# Patient Record
Sex: Female | Born: 1945
Health system: Southern US, Community
[De-identification: ages and names within clinical notes are randomized; demographics above are authoritative.]

## PROBLEM LIST (undated history)

## (undated) DIAGNOSIS — K625 Hemorrhage of anus and rectum: Secondary | ICD-10-CM

## (undated) DIAGNOSIS — I4891 Unspecified atrial fibrillation: Secondary | ICD-10-CM

## (undated) DIAGNOSIS — Z9289 Personal history of other medical treatment: Secondary | ICD-10-CM

## (undated) DIAGNOSIS — J189 Pneumonia, unspecified organism: Secondary | ICD-10-CM

## (undated) DIAGNOSIS — G8918 Other acute postprocedural pain: Secondary | ICD-10-CM

## (undated) DIAGNOSIS — N186 End stage renal disease: Secondary | ICD-10-CM

## (undated) DIAGNOSIS — E860 Dehydration: Secondary | ICD-10-CM

## (undated) DIAGNOSIS — T8859XA Other complications of anesthesia, initial encounter: Secondary | ICD-10-CM

## (undated) DIAGNOSIS — K219 Gastro-esophageal reflux disease without esophagitis: Secondary | ICD-10-CM

## (undated) DIAGNOSIS — R0602 Shortness of breath: Secondary | ICD-10-CM

## (undated) DIAGNOSIS — Z0389 Encounter for observation for other suspected diseases and conditions ruled out: Secondary | ICD-10-CM

## (undated) DIAGNOSIS — I10 Essential (primary) hypertension: Secondary | ICD-10-CM

## (undated) DIAGNOSIS — N390 Urinary tract infection, site not specified: Secondary | ICD-10-CM

## (undated) DIAGNOSIS — I5032 Chronic diastolic (congestive) heart failure: Secondary | ICD-10-CM

## (undated) DIAGNOSIS — T4145XA Adverse effect of unspecified anesthetic, initial encounter: Secondary | ICD-10-CM

## (undated) DIAGNOSIS — A0811 Acute gastroenteropathy due to Norwalk agent: Secondary | ICD-10-CM

## (undated) DIAGNOSIS — Z8719 Personal history of other diseases of the digestive system: Secondary | ICD-10-CM

## (undated) DIAGNOSIS — I480 Paroxysmal atrial fibrillation: Secondary | ICD-10-CM

## (undated) DIAGNOSIS — N200 Calculus of kidney: Secondary | ICD-10-CM

## (undated) DIAGNOSIS — Z992 Dependence on renal dialysis: Secondary | ICD-10-CM

## (undated) DIAGNOSIS — E785 Hyperlipidemia, unspecified: Secondary | ICD-10-CM

## (undated) DIAGNOSIS — R0902 Hypoxemia: Secondary | ICD-10-CM

## (undated) DIAGNOSIS — K921 Melena: Secondary | ICD-10-CM

## (undated) DIAGNOSIS — Z87442 Personal history of urinary calculi: Secondary | ICD-10-CM

## (undated) DIAGNOSIS — R51 Headache: Secondary | ICD-10-CM

## (undated) DIAGNOSIS — IMO0002 Reserved for concepts with insufficient information to code with codable children: Secondary | ICD-10-CM

## (undated) DIAGNOSIS — E611 Iron deficiency: Secondary | ICD-10-CM

## (undated) DIAGNOSIS — M1712 Unilateral primary osteoarthritis, left knee: Secondary | ICD-10-CM

## (undated) DIAGNOSIS — H409 Unspecified glaucoma: Secondary | ICD-10-CM

## (undated) DIAGNOSIS — I429 Cardiomyopathy, unspecified: Secondary | ICD-10-CM

## (undated) DIAGNOSIS — K5730 Diverticulosis of large intestine without perforation or abscess without bleeding: Secondary | ICD-10-CM

## (undated) HISTORY — DX: Personal history of other medical treatment: Z92.89

## (undated) HISTORY — PX: CARDIOVASCULAR STRESS TEST: SHX262

## (undated) HISTORY — PX: CATARACT EXTRACTION W/ INTRAOCULAR LENS  IMPLANT, BILATERAL: SHX1307

## (undated) HISTORY — PX: EXTRACORPOREAL SHOCK WAVE LITHOTRIPSY: SHX1557

## (undated) HISTORY — DX: Iron deficiency: E61.1

## (undated) HISTORY — DX: Personal history of other diseases of the digestive system: Z87.19

## (undated) HISTORY — PX: CARDIAC CATHETERIZATION: SHX172

## (undated) HISTORY — DX: Cardiomyopathy, unspecified: I42.9

## (undated) SURGERY — COLONOSCOPY WITH PROPOFOL
Anesthesia: Monitor Anesthesia Care | Laterality: Bilateral

---

## 1991-06-21 HISTORY — PX: TOTAL ABDOMINAL HYSTERECTOMY W/ BILATERAL SALPINGOOPHORECTOMY: SHX83

## 1998-05-13 ENCOUNTER — Other Ambulatory Visit: Admission: RE | Admit: 1998-05-13 | Discharge: 1998-05-13 | Payer: Self-pay | Admitting: *Deleted

## 1999-05-25 ENCOUNTER — Other Ambulatory Visit: Admission: RE | Admit: 1999-05-25 | Discharge: 1999-05-25 | Payer: Self-pay | Admitting: Obstetrics and Gynecology

## 1999-08-05 ENCOUNTER — Ambulatory Visit (HOSPITAL_COMMUNITY): Admission: RE | Admit: 1999-08-05 | Discharge: 1999-08-05 | Payer: Self-pay | Admitting: Gastroenterology

## 2001-02-06 ENCOUNTER — Encounter: Payer: Self-pay | Admitting: Family Medicine

## 2001-02-06 ENCOUNTER — Encounter: Admission: RE | Admit: 2001-02-06 | Discharge: 2001-02-06 | Payer: Self-pay | Admitting: Family Medicine

## 2001-06-06 ENCOUNTER — Other Ambulatory Visit: Admission: RE | Admit: 2001-06-06 | Discharge: 2001-06-06 | Payer: Self-pay | Admitting: Obstetrics and Gynecology

## 2002-09-22 ENCOUNTER — Encounter: Payer: Self-pay | Admitting: Emergency Medicine

## 2002-09-22 ENCOUNTER — Emergency Department (HOSPITAL_COMMUNITY): Admission: AD | Admit: 2002-09-22 | Discharge: 2002-09-22 | Payer: Self-pay | Admitting: Emergency Medicine

## 2003-02-14 ENCOUNTER — Ambulatory Visit (HOSPITAL_BASED_OUTPATIENT_CLINIC_OR_DEPARTMENT_OTHER): Admission: RE | Admit: 2003-02-14 | Discharge: 2003-02-14 | Payer: Self-pay | Admitting: Orthopedic Surgery

## 2003-02-14 HISTORY — PX: KNEE ARTHROSCOPY: SUR90

## 2005-01-25 ENCOUNTER — Encounter: Admission: RE | Admit: 2005-01-25 | Discharge: 2005-01-25 | Payer: Self-pay | Admitting: Family Medicine

## 2005-02-02 ENCOUNTER — Encounter: Admission: RE | Admit: 2005-02-02 | Discharge: 2005-02-02 | Payer: Self-pay | Admitting: Family Medicine

## 2005-02-25 ENCOUNTER — Emergency Department (HOSPITAL_COMMUNITY): Admission: EM | Admit: 2005-02-25 | Discharge: 2005-02-25 | Payer: Self-pay | Admitting: Emergency Medicine

## 2005-03-23 ENCOUNTER — Encounter (INDEPENDENT_AMBULATORY_CARE_PROVIDER_SITE_OTHER): Payer: Self-pay | Admitting: *Deleted

## 2005-03-23 ENCOUNTER — Ambulatory Visit (HOSPITAL_COMMUNITY): Admission: RE | Admit: 2005-03-23 | Discharge: 2005-03-23 | Payer: Self-pay | Admitting: Surgery

## 2005-03-23 HISTORY — PX: LAPAROSCOPIC CHOLECYSTECTOMY: SUR755

## 2007-07-31 ENCOUNTER — Other Ambulatory Visit: Admission: RE | Admit: 2007-07-31 | Discharge: 2007-07-31 | Payer: Self-pay | Admitting: Obstetrics & Gynecology

## 2007-07-31 LAB — HM PAP SMEAR: HM Pap smear: NORMAL

## 2007-08-23 HISTORY — PX: BREAST BIOPSY: SHX20

## 2010-01-18 LAB — HM COLONOSCOPY: HM Colonoscopy: NEGATIVE

## 2010-11-05 NOTE — Op Note (Signed)
[name], [name]               ACCOUNT NO.:  [id number]   MEDICAL RECORD NO.:  [id number]          PATIENT TYPE:  AMB   LOCATION:  DAY                          FACILITY:  [facility]   PHYSICIAN:  Thomas A. Cornett, M.D.DATE OF BIRTH:  [birth year]   DATE OF PROCEDURE:  03/23/2005  DATE OF DISCHARGE:                                 OPERATIVE REPORT   PREOPERATIVE DIAGNOSIS:  Symptomatic cholelithiasis.   POSTOPERATIVE DIAGNOSIS:  Symptomatic cholelithiasis.   PROCEDURE:  Laparoscopic cholecystectomy with intraoperative cholangiogram.   SURGEON:  Thomas A. Cornett, M.D.   ASSISTANT:  Georgina Quint, M.D.   ANESTHESIA:  General endotracheal anesthesia.   ESTIMATED BLOOD LOSS:  60 mL.   DRAINS:  None.   INDICATIONS FOR PROCEDURE:  The patient is a 65 year old female who has  biliary colic. She had gallstones by ultrasound and I recommended a  laparoscopic cholecystectomy for symptomatic cholelithiasis.   DESCRIPTION OF PROCEDURE:  The patient was brought to the operating suite  and placed supine. After induction of general endotracheal anesthesia, her  abdomen prepped and draped in a sterile fashion. An oral gastric tube was  placed and she received preoperative antibiotics. A 1 cm supraumbilical  incision was made, dissection was carried down to her fascia. A small  incision was made in her fascia and both sides were grasped in between  [place] clamps. A Kelly clamp was then used to open the peritoneum. An 11 mm  Hasson cannula was placed under direct vision and pneumoperitoneum was  created to 15 mmHg with CO2. Laparoscopy was then performed. There is of  note a lower abdominal hernia just above the pubis with no evidence of bowel  and some omentum adhesed to it. This appeared reducible. In the upper  abdomen, the gallbladder was visualized. Three 5 mm ports were placed, one  in the subxiphoid position under direct vision with local anesthesia, one in  the right upper quadrant  and a third in the right mid abdomen. The  gallbladder dome was grasped and retracted to the patient's right shoulder.  The infundibulum was grasped and retracted to the patient's right lower  quadrant. I used the cautery to score the peritoneum over the junction of  the cystic duct and gallbladder. I dissected out the cystic duct  circumferentially. This is the only tubular structure entering the  gallbladder infundibulum. A clip was placed on the  gallbladder side of this  and a small incision was made in the cystic duct to perform intraoperative  cholangiogram. Through a separate stab incision, a Cook Cholangiogram  catheter was placed under direct vision. The catheter was placed in the  cystic duct, this was clipped, irrigated with no signs of leakage.  Intraoperative Cholangiogram with fluoroscopy was then performed with 1/2  strength Hypaque dye. The cystic duct appeared to insert on the right  hepatic duct. It may have run posteriorly to insert in the common duct but  appeared to me to insert on the right hepatic duct. The left hepatic duct  could be visualized as well. The common duct could be visualized with free  flow of bile into the duodenum without signs of obstruction or stone. At  this point in time, the Cholangiogram was complete. I double clipped the  cystic duct stump and continued the division of the cystic duct. The cystic  artery had some small branches that I controlled individually with small  clips entering the gallbladder. Once these vessels were under control,  cautery was used to dissect the gallbladder from the gallbladder fossa. This  was done to the dome. There was a large branch of a vein that entered the  dome which had some bleeding once divided with the cautery. This was  controlled with electrocautery and Surgicel with adequate results. The  gallbladder was then extracted through the umbilical port with the help of a  5 mm screw. At this point in time, the  10 mm port was removed and I closed  this with a #0 Vicryl suture. I inspected the bed and there was still some  oozing from the gallbladder bed and again this was controlled with cautery  as well some Surgicel with satisfactory response. The clips around the  cystic duct and the cystic artery with no signs of bleeding or leakage of  bile at this point. The Surgicel was kept in place. I had to reinsert a 10  mm port though to help aid with control of the oozing from the hepatic bed.  Once the oozing had stopped, all irrigation was suctioned out. I then  removed the ports under direct vision with no signs of port site bleeding. I  removed the 11 mm Hasson umbilical port and passed it off the field. I  closed the umbilical port with a second suture of figure 8 using #0 Vicryl.  A 4-0 Monocryl was used to close all skin incisions. All sponge, needle and  counts were found be correct at this portion of the case. The patient was  then awoke, taken to recovery in satisfactory condition.      Thomas A. Cornett, M.D.  Electronically Signed     TAC/MEDQ  D:  03/23/2005  T:  03/23/2005  Job:  [id number]   cc:   Luna Kitchens. Redmond Pulling, M.D.  Fax: [facility]

## 2010-11-05 NOTE — Op Note (Signed)
[name], [name] NO.:  [id number]   MEDICAL RECORD NO.:  [id number]                   PATIENT TYPE:  AMB   LOCATION:                                       FACILITY:  [facility]   PHYSICIAN:  Kipp Brood. Gioffre, M.D.             DATE OF BIRTH:  05-04-1946   DATE OF PROCEDURE:  02/14/2003  DATE OF DISCHARGE:                                 OPERATIVE REPORT   PREOPERATIVE DIAGNOSIS:  Rule out the possibility of a torn medial meniscus  left knee versus degenerative arthritis left knee.   POSTOPERATIVE DIAGNOSES:  1. Early degenerative arthritis left knee.  2. Severe chronic synovitis left knee.   PROCEDURE:   SURGEON:  Ronald A. Gioffre, M.D.   ASSISTANT:  Nurse   Under general anesthesia, routine orthopedic prep and drape of the left knee  was carried out.  At this time a small punctate incision was made in the  suprapatellar pouch and inflow cannula was inserted and the knee was  distended with saline.  Following this, another small punctate incision was  made in the anterolateral joint space.  The arthroscope was entered and a  complete diagnostic arthroscopy was carried out.  At this time, I noted in  the suprapatellar pouch there was severe chronic synovitis. I introduced a  shaver suction device and did a synovectomy.   I then went down the lateral joint space.  It was clean.  There were no  meniscal tears. The cruciates were intact.  The medial joint space, and the  medial meniscus was intact, but she had a large flap tear of the articular  cartilage of the medial femoral condyle.  This presented as though it was a  piece of orange peel pealing off an orange and I simply introduced a shaver  suction device and did an abrasion chondroplasty.  I thoroughly irrigated  out the knee, took a second look back into the knee with the scope and there  were no other abnormalities.   I thoroughly irrigated out the knee, closed all 3 punctate  incisions with 3-  0 nylon suture.  I then injected 30 cc of 1/2% Marcaine with epinephrine  into the knee joint.  Sterile bundle dressing was applied.  The patient will  be placed on Percocet 10/650 for pain postoperative as well as Keflex 500 mg  q.i.d. She will also be on Bufferin 1 twice a day for 2 weeks as an  anticoagulant.  She will be on a crutches, partial weight bearing.  I will  see her in 12-14 days in the office or prior to that if there are any  problems.  Ronald A. Gladstone Lighter, M.D.    RAG/MEDQ  D:  02/14/2003  T:  02/15/2003  Job:  [id number]

## 2011-06-21 DIAGNOSIS — I219 Acute myocardial infarction, unspecified: Secondary | ICD-10-CM

## 2011-06-21 HISTORY — DX: Acute myocardial infarction, unspecified: I21.9

## 2011-07-06 LAB — HM DEXA SCAN

## 2011-07-29 LAB — HM MAMMOGRAPHY: HM Mammogram: NORMAL

## 2012-04-03 ENCOUNTER — Emergency Department (HOSPITAL_COMMUNITY): Payer: Medicare Other

## 2012-04-03 ENCOUNTER — Encounter (HOSPITAL_COMMUNITY): Payer: Self-pay | Admitting: *Deleted

## 2012-04-03 ENCOUNTER — Inpatient Hospital Stay (HOSPITAL_COMMUNITY)
Admission: EM | Admit: 2012-04-03 | Discharge: 2012-04-17 | DRG: 870 | Disposition: A | Discharge status: Rehabilitation Facility | Payer: Medicare Other | Attending: Internal Medicine | Admitting: Internal Medicine

## 2012-04-03 DIAGNOSIS — N12 Tubulo-interstitial nephritis, not specified as acute or chronic: Secondary | ICD-10-CM

## 2012-04-03 DIAGNOSIS — N200 Calculus of kidney: Secondary | ICD-10-CM

## 2012-04-03 DIAGNOSIS — G9341 Metabolic encephalopathy: Secondary | ICD-10-CM | POA: Diagnosis present

## 2012-04-03 DIAGNOSIS — Z6839 Body mass index (BMI) 39.0-39.9, adult: Secondary | ICD-10-CM

## 2012-04-03 DIAGNOSIS — K7200 Acute and subacute hepatic failure without coma: Secondary | ICD-10-CM | POA: Diagnosis present

## 2012-04-03 DIAGNOSIS — Z23 Encounter for immunization: Secondary | ICD-10-CM

## 2012-04-03 DIAGNOSIS — I214 Non-ST elevation (NSTEMI) myocardial infarction: Secondary | ICD-10-CM | POA: Diagnosis not present

## 2012-04-03 DIAGNOSIS — A419 Sepsis, unspecified organism: Secondary | ICD-10-CM

## 2012-04-03 DIAGNOSIS — R6520 Severe sepsis without septic shock: Secondary | ICD-10-CM | POA: Diagnosis present

## 2012-04-03 DIAGNOSIS — E870 Hyperosmolality and hypernatremia: Secondary | ICD-10-CM | POA: Diagnosis not present

## 2012-04-03 DIAGNOSIS — R5381 Other malaise: Secondary | ICD-10-CM | POA: Diagnosis not present

## 2012-04-03 DIAGNOSIS — G9340 Encephalopathy, unspecified: Secondary | ICD-10-CM | POA: Diagnosis present

## 2012-04-03 DIAGNOSIS — Y9210 Unspecified residential institution as the place of occurrence of the external cause: Secondary | ICD-10-CM | POA: Diagnosis not present

## 2012-04-03 DIAGNOSIS — N179 Acute kidney failure, unspecified: Secondary | ICD-10-CM

## 2012-04-03 DIAGNOSIS — I4891 Unspecified atrial fibrillation: Secondary | ICD-10-CM | POA: Diagnosis not present

## 2012-04-03 DIAGNOSIS — L270 Generalized skin eruption due to drugs and medicaments taken internally: Secondary | ICD-10-CM | POA: Diagnosis not present

## 2012-04-03 DIAGNOSIS — R197 Diarrhea, unspecified: Secondary | ICD-10-CM | POA: Diagnosis not present

## 2012-04-03 DIAGNOSIS — I5021 Acute systolic (congestive) heart failure: Secondary | ICD-10-CM | POA: Diagnosis not present

## 2012-04-03 DIAGNOSIS — J9819 Other pulmonary collapse: Secondary | ICD-10-CM | POA: Diagnosis present

## 2012-04-03 DIAGNOSIS — E876 Hypokalemia: Secondary | ICD-10-CM | POA: Diagnosis not present

## 2012-04-03 DIAGNOSIS — Z79899 Other long term (current) drug therapy: Secondary | ICD-10-CM

## 2012-04-03 DIAGNOSIS — D696 Thrombocytopenia, unspecified: Secondary | ICD-10-CM | POA: Diagnosis not present

## 2012-04-03 DIAGNOSIS — R0902 Hypoxemia: Secondary | ICD-10-CM

## 2012-04-03 DIAGNOSIS — A4151 Sepsis due to Escherichia coli [E. coli]: Principal | ICD-10-CM | POA: Diagnosis present

## 2012-04-03 DIAGNOSIS — J9600 Acute respiratory failure, unspecified whether with hypoxia or hypercapnia: Secondary | ICD-10-CM | POA: Diagnosis not present

## 2012-04-03 DIAGNOSIS — I10 Essential (primary) hypertension: Secondary | ICD-10-CM | POA: Diagnosis present

## 2012-04-03 DIAGNOSIS — T50995A Adverse effect of other drugs, medicaments and biological substances, initial encounter: Secondary | ICD-10-CM | POA: Diagnosis not present

## 2012-04-03 DIAGNOSIS — R6521 Severe sepsis with septic shock: Secondary | ICD-10-CM | POA: Diagnosis present

## 2012-04-03 DIAGNOSIS — I509 Heart failure, unspecified: Secondary | ICD-10-CM | POA: Diagnosis present

## 2012-04-03 DIAGNOSIS — E872 Acidosis, unspecified: Secondary | ICD-10-CM | POA: Diagnosis not present

## 2012-04-03 DIAGNOSIS — J189 Pneumonia, unspecified organism: Secondary | ICD-10-CM | POA: Diagnosis present

## 2012-04-03 DIAGNOSIS — N201 Calculus of ureter: Secondary | ICD-10-CM | POA: Diagnosis present

## 2012-04-03 DIAGNOSIS — E46 Unspecified protein-calorie malnutrition: Secondary | ICD-10-CM | POA: Diagnosis not present

## 2012-04-03 DIAGNOSIS — Z7982 Long term (current) use of aspirin: Secondary | ICD-10-CM

## 2012-04-03 DIAGNOSIS — N170 Acute kidney failure with tubular necrosis: Secondary | ICD-10-CM | POA: Diagnosis present

## 2012-04-03 DIAGNOSIS — N390 Urinary tract infection, site not specified: Secondary | ICD-10-CM

## 2012-04-03 DIAGNOSIS — K219 Gastro-esophageal reflux disease without esophagitis: Secondary | ICD-10-CM | POA: Diagnosis present

## 2012-04-03 DIAGNOSIS — E162 Hypoglycemia, unspecified: Secondary | ICD-10-CM | POA: Diagnosis not present

## 2012-04-03 DIAGNOSIS — B372 Candidiasis of skin and nail: Secondary | ICD-10-CM | POA: Diagnosis not present

## 2012-04-03 DIAGNOSIS — I428 Other cardiomyopathies: Secondary | ICD-10-CM | POA: Diagnosis present

## 2012-04-03 HISTORY — DX: Unspecified glaucoma: H40.9

## 2012-04-03 HISTORY — DX: Essential (primary) hypertension: I10

## 2012-04-03 HISTORY — DX: Gastro-esophageal reflux disease without esophagitis: K21.9

## 2012-04-03 HISTORY — DX: Headache: R51

## 2012-04-03 HISTORY — DX: Pneumonia, unspecified organism: J18.9

## 2012-04-03 LAB — CBC WITH DIFFERENTIAL/PLATELET
Basophils Absolute: 0 10*3/uL (ref 0.0–0.1)
Basophils Relative: 0 % (ref 0–1)
Eosinophils Absolute: 0.1 10*3/uL (ref 0.0–0.7)
Eosinophils Relative: 1 % (ref 0–5)
HCT: 36.3 % (ref 36.0–46.0)
Hemoglobin: 12.2 g/dL (ref 12.0–15.0)
Lymphocytes Relative: 3 % — ABNORMAL LOW (ref 12–46)
Lymphs Abs: 0.2 10*3/uL — ABNORMAL LOW (ref 0.7–4.0)
MCH: 29.3 pg (ref 26.0–34.0)
MCHC: 33.6 g/dL (ref 30.0–36.0)
MCV: 87.1 fL (ref 78.0–100.0)
Monocytes Absolute: 0.1 10*3/uL (ref 0.1–1.0)
Monocytes Relative: 1 % — ABNORMAL LOW (ref 3–12)
Neutro Abs: 4.9 10*3/uL (ref 1.7–7.7)
Neutrophils Relative %: 95 % — ABNORMAL HIGH (ref 43–77)
Platelets: 131 10*3/uL — ABNORMAL LOW (ref 150–400)
RBC: 4.17 MIL/uL (ref 3.87–5.11)
RDW: 14.8 % (ref 11.5–15.5)
WBC Morphology: INCREASED
WBC: 5.3 10*3/uL (ref 4.0–10.5)

## 2012-04-03 LAB — POCT I-STAT 3, ART BLOOD GAS (G3+)
Acid-base deficit: 7 mmol/L — ABNORMAL HIGH (ref 0.0–2.0)
Bicarbonate: 19.3 mEq/L — ABNORMAL LOW (ref 20.0–24.0)
O2 Saturation: 29 %
Patient temperature: 98.6
TCO2: 21 mmol/L (ref 0–100)
pCO2 arterial: 41 mmHg (ref 35.0–45.0)
pH, Arterial: 7.281 — ABNORMAL LOW (ref 7.350–7.450)
pO2, Arterial: 21 mmHg — CL (ref 80.0–100.0)

## 2012-04-03 LAB — URINE MICROSCOPIC-ADD ON

## 2012-04-03 LAB — URINALYSIS, ROUTINE W REFLEX MICROSCOPIC
Glucose, UA: NEGATIVE mg/dL
Ketones, ur: 15 mg/dL — AB
Nitrite: POSITIVE — AB
Protein, ur: 30 mg/dL — AB
Specific Gravity, Urine: 1.024 (ref 1.005–1.030)
Urobilinogen, UA: 1 mg/dL (ref 0.0–1.0)
pH: 5 (ref 5.0–8.0)

## 2012-04-03 LAB — LACTIC ACID, PLASMA: Lactic Acid, Venous: 5 mmol/L — ABNORMAL HIGH (ref 0.5–2.2)

## 2012-04-03 LAB — POCT I-STAT TROPONIN I: Troponin i, poc: 0.06 ng/mL (ref 0.00–0.08)

## 2012-04-03 LAB — GLUCOSE, CAPILLARY: Glucose-Capillary: 97 mg/dL (ref 70–99)

## 2012-04-03 MED ORDER — ACETAMINOPHEN 650 MG RE SUPP
650.0000 mg | Freq: Once | RECTAL | Status: AC
  Administered 2012-04-03: 650 mg via RECTAL
  Filled 2012-04-03: qty 1

## 2012-04-03 MED ORDER — SODIUM CHLORIDE 0.9 % IV BOLUS (SEPSIS)
1000.0000 mL | Freq: Once | INTRAVENOUS | Status: AC
  Administered 2012-04-03: 1000 mL via INTRAVENOUS

## 2012-04-03 MED ORDER — DEXTROSE 5 % IV SOLN
INTRAVENOUS | Status: AC
  Filled 2012-04-03: qty 250

## 2012-04-03 MED ORDER — NOREPINEPHRINE BITARTRATE 1 MG/ML IJ SOLN
INTRAMUSCULAR | Status: AC
  Filled 2012-04-03: qty 4

## 2012-04-03 MED ORDER — PIPERACILLIN-TAZOBACTAM 3.375 G IVPB
3.3750 g | Freq: Once | INTRAVENOUS | Status: AC
  Administered 2012-04-03: 3.375 g via INTRAVENOUS
  Filled 2012-04-03: qty 50

## 2012-04-03 MED ORDER — NOREPINEPHRINE BITARTRATE 1 MG/ML IJ SOLN
2.0000 ug/min | Freq: Once | INTRAVENOUS | Status: AC
  Administered 2012-04-04: 5 ug/min via INTRAVENOUS
  Filled 2012-04-03: qty 4

## 2012-04-03 MED ORDER — VANCOMYCIN HCL 1000 MG IV SOLR
1500.0000 mg | Freq: Once | INTRAVENOUS | Status: AC
  Administered 2012-04-03: 1500 mg via INTRAVENOUS
  Filled 2012-04-03 (×2): qty 1500

## 2012-04-03 NOTE — ED Notes (Signed)
RES and EDP made aware of pts blood pressure and vitals signs. RES at bedside to speak with pt regarding central line placement.

## 2012-04-03 NOTE — ED Notes (Signed)
Pt in via EMS- Pt in c/o shortness of breath since this am, pt woke daughter up around 4 am stating she thought she had a kidney stone, denies pain at this time, shortness of breath developed this evening, original O2 at 83%, pt on NRB at this time at 100%, pt warm to touch for EMS, IV established PTA.

## 2012-04-03 NOTE — ED Notes (Signed)
Lab at bedside obtaining blood cultures, once obtained will start antibiotics.

## 2012-04-03 NOTE — ED Provider Notes (Signed)
History     CSN: [id number]  Arrival date & time 04/03/12  2156   First MD Initiated Contact with Patient 04/03/12 2156      Chief Complaint  Patient presents with  . Shortness of Breath     HPI   she 66-year-old female with past medical history of hypertension presents via EMS with shortness of breath him a left flank pain, and altered mental status. On arrival to the scene EMS reports an O2 saturation of 82% the patient was placed on nonrebreather mask. Per daughter's report the patient complained of left flank pain promptly 1 AM this morning. This pain lasted approximately 6-7 hours. Patient subsequently developed confusion, Chills and subjective fever. Her flank pain was described as sharp.  On arrival the patient is febrile to 104.1, tachycardic into the 130s tachypneic (respiratory rate 35-40), satting 95% on 2 L nasal cannula. Patient denies any pain on arrival. She is oriented to place person and time.  Past Medical History  Diagnosis Date  . Hypertension     History reviewed. No pertinent past surgical history.  History reviewed. No pertinent family history.  History  Substance Use Topics  . Smoking status: Not on file  . Smokeless tobacco: Not on file  . Alcohol Use:     OB History    Grav Para Term Preterm Abortions TAB SAB Ect Mult Living                  Review of Systems  Constitutional: Positive for fever and chills. Negative for activity change and appetite change.  HENT: Negative for ear pain, congestion, rhinorrhea and neck pain.   Eyes: Negative for pain.  Respiratory: Negative for cough and shortness of breath.   Cardiovascular: Negative for chest pain and palpitations.  Gastrointestinal: Positive for abdominal pain ( Left flank now resolved). Negative for nausea and vomiting.  Genitourinary: Negative for dysuria, difficulty urinating and pelvic pain.  Musculoskeletal: Negative for back pain.  Skin: Negative for rash and wound.  Neurological:  Negative for weakness and headaches.  Psychiatric/Behavioral: Positive for confusion. Negative for behavioral problems and agitation.    Allergies  Review of patient's allergies indicates no known allergies.  Home Medications  No current outpatient prescriptions on file.  There were no vitals taken for this visit.  Physical Exam  Constitutional: She is oriented to person, place, and time. She appears well-developed and well-nourished. No distress.       Morbidly obese  HENT:  Head: Normocephalic and atraumatic.  Nose: Nose normal.  Mouth/Throat: Oropharynx is clear and moist.  Eyes: EOM are normal. Pupils are equal, round, and reactive to light.  Neck: Normal range of motion. Neck supple. No tracheal deviation present.  Cardiovascular: Regular rhythm, normal heart sounds and intact distal pulses.        Tachycardic  Pulmonary/Chest: She has rales ( Bibasilar).       Tachypneic  Abdominal: Soft. Bowel sounds are normal. She exhibits no distension. There is no tenderness. There is no rebound and no guarding.       Left CVA tenderness  Musculoskeletal: Normal range of motion. She exhibits no tenderness.  Neurological: She is oriented to person, place, and time.  Skin: Skin is warm and dry. No rash noted.    ED Course  CENTRAL LINE Date/Time: 04/04/2012 12:02 AM Performed by: Ruthell Rummage Authorized by: Ruthell Rummage Consent: Verbal consent obtained. Written consent obtained. Consent given by: Daughter. Patient identity confirmed: verbally with patient and arm  band Time out: Immediately prior to procedure a "time out" was called to verify the correct patient, procedure, equipment, support staff and site/side marked as required. Indications: vascular access and central pressure monitoring Anesthesia: local infiltration Local anesthetic: lidocaine 1% with epinephrine Anesthetic total: 3 ml Patient sedated: no Preparation: skin prepped with 2% chlorhexidine Skin prep agent  dried: skin prep agent completely dried prior to procedure Sterile barriers: all five maximum sterile barriers used - cap, mask, sterile gown, sterile gloves, and large sterile sheet Hand hygiene: hand hygiene performed prior to central venous catheter insertion Location details: right internal jugular Patient position: flat Catheter type: triple lumen Pre-procedure: landmarks identified Ultrasound guidance: yes Number of attempts: 1 Successful placement: yes Post-procedure: line sutured and dressing applied Assessment: blood return through all parts, no pneumothorax on x-ray and placement verified by x-ray Patient tolerance: Patient tolerated the procedure well with no immediate complications.    Results for orders placed during the hospital encounter of 04/03/12  CBC WITH DIFFERENTIAL      Component Value Range   WBC 5.3  4.0 - 10.5 K/uL   RBC 4.17  3.87 - 5.11 MIL/uL   Hemoglobin 12.2  12.0 - 15.0 g/dL   HCT 36.3  36.0 - 46.0 %   MCV 87.1  78.0 - 100.0 fL   MCH 29.3  26.0 - 34.0 pg   MCHC 33.6  30.0 - 36.0 g/dL   RDW 14.8  11.5 - 15.5 %   Platelets 131 (*) 150 - 400 K/uL   Neutrophils Relative 95 (*) 43 - 77 %   Lymphocytes Relative 3 (*) 12 - 46 %   Monocytes Relative 1 (*) 3 - 12 %   Eosinophils Relative 1  0 - 5 %   Basophils Relative 0  0 - 1 %   Neutro Abs 4.9  1.7 - 7.7 K/uL   Lymphs Abs 0.2 (*) 0.7 - 4.0 K/uL   Monocytes Absolute 0.1  0.1 - 1.0 K/uL   Eosinophils Absolute 0.1  0.0 - 0.7 K/uL   Basophils Absolute 0.0  0.0 - 0.1 K/uL   WBC Morphology INCREASED BANDS (>20% BANDS)    LACTIC ACID, PLASMA      Component Value Range   Lactic Acid, Venous 5.0 (*) 0.5 - 2.2 mmol/L  URINALYSIS, ROUTINE W REFLEX MICROSCOPIC      Component Value Range   Color, Urine ORANGE (*) YELLOW   APPearance TURBID (*) CLEAR   Specific Gravity, Urine 1.024  1.005 - 1.030   pH 5.0  5.0 - 8.0   Glucose, UA NEGATIVE  NEGATIVE mg/dL   Hgb urine dipstick LARGE (*) NEGATIVE   Bilirubin  Urine SMALL (*) NEGATIVE   Ketones, ur 15 (*) NEGATIVE mg/dL   Protein, ur 30 (*) NEGATIVE mg/dL   Urobilinogen, UA 1.0  0.0 - 1.0 mg/dL   Nitrite POSITIVE (*) NEGATIVE   Leukocytes, UA LARGE (*) NEGATIVE  GLUCOSE, CAPILLARY      Component Value Range   Glucose-Capillary 97  70 - 99 mg/dL  POCT I-STAT TROPONIN I      Component Value Range   Troponin i, poc 0.06  0.00 - 0.08 ng/mL   Comment 3           URINE MICROSCOPIC-ADD ON      Component Value Range   Squamous Epithelial / LPF FEW (*) RARE   WBC, UA TOO NUMEROUS TO COUNT  <3 WBC/hpf   RBC / HPF 21-50  <3 RBC/hpf  Bacteria, UA MANY (*) RARE  POCT I-STAT 3, BLOOD GAS (G3+)      Component Value Range   pH, Arterial 7.281 (*) 7.350 - 7.450   pCO2 arterial 41.0  35.0 - 45.0 mmHg   pO2, Arterial 21.0 (*) 80.0 - 100.0 mmHg   Bicarbonate 19.3 (*) 20.0 - 24.0 mEq/L   TCO2 21  0 - 100 mmol/L   O2 Saturation 29.0     Acid-base deficit 7.0 (*) 0.0 - 2.0 mmol/L   Patient temperature 98.6 F     Collection site RADIAL, ALLEN'S TEST ACCEPTABLE     Drawn by Operator     Sample type ARTERIAL     Comment NOTIFIED PHYSICIAN      DG Chest Portable 1 View (Final result)   Result time:04/03/12 2238    Final result by Rad Results In Interface (04/03/12 22:38:39)    Narrative:   *RADIOLOGY REPORT*  Clinical Data: Shortness of breath.  PORTABLE CHEST - 1 VIEW  Comparison: 03/23/2005  Findings: Numerous leads and wires project over the chest. Patient rotated left. Mild cardiomegaly. Cannot exclude small left pleural effusion; soft tissues project over the left costophrenic angle on the frontal. No pneumothorax. Low lung volumes. Possible concurrent mild pulmonary venous congestion. Mild volume loss at the left lung base. Moderate to large hiatal hernia.  IMPRESSION:  1. Cardiomegaly and low lung volumes. Cannot exclude mild pulmonary venous congestion. 2. Moderate-to-large hiatal hernia. 3. Degraded AP portable view secondary  overlying soft tissues at the left lung base. Consider PA and lateral radiographs, if possible.   Original Report Authenticated By: Areta Haber, M.D.      1. Urosepsis       MDM    66 year old female presents to EMS. Code sepsis activated on arrival. Patient febrile, tachycardic, and hypotensive. Blood cultures drawn prior to and about in menstruation patient received vancomycin and Zosyn. Copious hydration with normal saline boluses. The patient received 4 L while in the emergency department. UA with positive nitrites leukocytes and many bacteria. Lactic acid 5.0. No evidence of consolidation on chest x-ray. Doubt pneumonia but recommend obtaining a two-view chest x-ray when patient more stable. ABG with a pH of 7.28 PCO2 of 41.0. Patient sat in the mid 90s on 2 L nasal cannula. No clinical need for intubation at this time. Central line placed as described above. Patient was started on Levophed and a drip. Case discussed with the critical care staff. Plan to admit patient to the intensive care unit. Recommend CT of abdomen and pelvis to rule out intra-abdominal pathology given history of severe left flank pain when patient is more stable. Patient is now pain-free.   CRITICAL CARE Performed by: Ruthell Rummage   Total critical care time:  35 min  Critical care time was exclusive of separately billable procedures and treating other patients.  Critical care was necessary to treat or prevent imminent or life-threatening deterioration.  Critical care was time spent personally by me on the following activities: development of treatment plan with patient and/or surrogate as well as nursing, discussions with consultants, evaluation of patient's response to treatment, examination of patient, obtaining history from patient or surrogate, ordering and performing treatments and interventions, ordering and review of laboratory studies, ordering and review of radiographic studies, pulse oximetry  and re-evaluation of patient's condition.      Ruthell Rummage, MD 04/04/12 0157  Ruthell Rummage, MD 04/04/12 [phone]

## 2012-04-03 NOTE — ED Notes (Signed)
MD Judithann Graves at bedside preparing for central line placement.

## 2012-04-03 NOTE — ED Provider Notes (Addendum)
I saw and evaluated the patient, reviewed the resident's note and I agree with the findings and plan.  Patient with shortness of breath and cough with hypotension and hypoxia. Suspect the patient likely has pneumonia. Awaiting labs and x-rays. Due to her hypotension to sepsis called. Blood cultures and antibiotics ordered and she will be admitted  Leota Jacobsen, MD 04/03/12 2218\   11:40 PM Patient reassessed multiple times during her stay in the department. Patient given IV fluids and treatment for her fever. Patient started on empiric antibiotics for sepsis. Patient is urine positive for infection patient is likely uroseptic. Spoke with intensivist and will come to admit the patient. Central line was placed by the resident which was done under my direct supervision. Patient to be started on Levophed for persistent hypotension despite given adequate fluids.  CRITICAL CARE Performed by: Leota Jacobsen   Total critical care time: 70  Critical care time was exclusive of separately billable procedures and treating other patients.  Critical care was necessary to treat or prevent imminent or life-threatening deterioration.  Critical care was time spent personally by me on the following activities: development of treatment plan with patient and/or surrogate as well as nursing, discussions with consultants, evaluation of patient's response to treatment, examination of patient, obtaining history from patient or surrogate, ordering and performing treatments and interventions, ordering and review of laboratory studies, ordering and review of radiographic studies, pulse oximetry and re-evaluation of patient's condition.   Leota Jacobsen, MD 04/03/12 [phone]

## 2012-04-03 NOTE — ED Notes (Signed)
Central line placed by MD Arrieta, portable chest xray to be obtained.

## 2012-04-04 ENCOUNTER — Inpatient Hospital Stay (HOSPITAL_COMMUNITY): Payer: Medicare Other

## 2012-04-04 ENCOUNTER — Encounter (HOSPITAL_COMMUNITY): Payer: Self-pay | Admitting: Anesthesiology

## 2012-04-04 ENCOUNTER — Encounter (HOSPITAL_COMMUNITY)
Admission: EM | Disposition: A | Discharge status: Rehabilitation Facility | Payer: Self-pay | Source: Home / Self Care | Attending: Pulmonary Disease

## 2012-04-04 ENCOUNTER — Encounter (HOSPITAL_COMMUNITY): Payer: Self-pay | Admitting: *Deleted

## 2012-04-04 ENCOUNTER — Inpatient Hospital Stay (HOSPITAL_COMMUNITY): Payer: Medicare Other | Admitting: Anesthesiology

## 2012-04-04 DIAGNOSIS — N200 Calculus of kidney: Secondary | ICD-10-CM | POA: Diagnosis present

## 2012-04-04 DIAGNOSIS — N12 Tubulo-interstitial nephritis, not specified as acute or chronic: Secondary | ICD-10-CM | POA: Diagnosis present

## 2012-04-04 DIAGNOSIS — R6521 Severe sepsis with septic shock: Secondary | ICD-10-CM | POA: Diagnosis present

## 2012-04-04 DIAGNOSIS — R0902 Hypoxemia: Secondary | ICD-10-CM | POA: Diagnosis present

## 2012-04-04 DIAGNOSIS — G9340 Encephalopathy, unspecified: Secondary | ICD-10-CM | POA: Diagnosis present

## 2012-04-04 DIAGNOSIS — I369 Nonrheumatic tricuspid valve disorder, unspecified: Secondary | ICD-10-CM

## 2012-04-04 DIAGNOSIS — J9600 Acute respiratory failure, unspecified whether with hypoxia or hypercapnia: Secondary | ICD-10-CM

## 2012-04-04 DIAGNOSIS — N390 Urinary tract infection, site not specified: Secondary | ICD-10-CM

## 2012-04-04 DIAGNOSIS — A419 Sepsis, unspecified organism: Secondary | ICD-10-CM | POA: Diagnosis present

## 2012-04-04 DIAGNOSIS — N179 Acute kidney failure, unspecified: Secondary | ICD-10-CM

## 2012-04-04 HISTORY — PX: CYSTOSCOPY W/ URETERAL STENT PLACEMENT: SHX1429

## 2012-04-04 LAB — POCT I-STAT 3, ART BLOOD GAS (G3+)
Acid-base deficit: 13 mmol/L — ABNORMAL HIGH (ref 0.0–2.0)
Acid-base deficit: 11 mmol/L — ABNORMAL HIGH (ref 0.0–2.0)
Bicarbonate: 14.2 mEq/L — ABNORMAL LOW (ref 20.0–24.0)
Bicarbonate: 14.3 mEq/L — ABNORMAL LOW (ref 20.0–24.0)
O2 Saturation: 94 %
O2 Saturation: 99 %
Patient temperature: 98.5
Patient temperature: 98.6
TCO2: 15 mmol/L (ref 0–100)
TCO2: 15 mmol/L (ref 0–100)
pCO2 arterial: 34.8 mmHg — ABNORMAL LOW (ref 35.0–45.0)
pCO2 arterial: 30.5 mmHg — ABNORMAL LOW (ref 35.0–45.0)
pH, Arterial: 7.217 — ABNORMAL LOW (ref 7.350–7.450)
pH, Arterial: 7.278 — ABNORMAL LOW (ref 7.350–7.450)
pO2, Arterial: 86 mmHg (ref 80.0–100.0)
pO2, Arterial: 150 mmHg — ABNORMAL HIGH (ref 80.0–100.0)

## 2012-04-04 LAB — TROPONIN I
Troponin I: <0.3 ng/mL (ref <0.30)
Troponin I: 0.44 ng/mL (ref <0.30)
Troponin I: 1.12 ng/mL (ref <0.30)
Troponin I: 1.49 ng/mL (ref <0.30)

## 2012-04-04 LAB — CORTISOL
Cortisol, Plasma: 44.2 ug/dL
Cortisol, Plasma: 33.8 ug/dL
Cortisol, Plasma: 34.7 ug/dL

## 2012-04-04 LAB — POCT I-STAT 7, (LYTES, BLD GAS, ICA,H+H)
Acid-base deficit: 14 mmol/L — ABNORMAL HIGH (ref 0.0–2.0)
Bicarbonate: 15.8 mEq/L — ABNORMAL LOW (ref 20.0–24.0)
Calcium, Ion: 1.07 mmol/L — ABNORMAL LOW (ref 1.13–1.30)
HCT: 39 % (ref 36.0–46.0)
Hemoglobin: 13.3 g/dL (ref 12.0–15.0)
O2 Saturation: 92 %
Patient temperature: 37.5
Potassium: 4.1 mEq/L (ref 3.5–5.1)
Sodium: 136 mEq/L (ref 135–145)
TCO2: 17 mmol/L (ref 0–100)
pCO2 arterial: 50.9 mmHg — ABNORMAL HIGH (ref 35.0–45.0)
pH, Arterial: 7.104 — CL (ref 7.350–7.450)
pO2, Arterial: 89 mmHg (ref 80.0–100.0)

## 2012-04-04 LAB — HEPATIC FUNCTION PANEL
ALT: 80 U/L — ABNORMAL HIGH (ref 0–35)
AST: 127 U/L — ABNORMAL HIGH (ref 0–37)
Albumin: 2.4 g/dL — ABNORMAL LOW (ref 3.5–5.2)
Alkaline Phosphatase: 209 U/L — ABNORMAL HIGH (ref 39–117)
Bilirubin, Direct: 0.8 mg/dL — ABNORMAL HIGH (ref 0.0–0.3)
Indirect Bilirubin: 0.1 mg/dL — ABNORMAL LOW (ref 0.3–0.9)
Total Bilirubin: 0.9 mg/dL (ref 0.3–1.2)
Total Protein: 5.2 g/dL — ABNORMAL LOW (ref 6.0–8.3)

## 2012-04-04 LAB — COMPREHENSIVE METABOLIC PANEL
ALT: 71 U/L — ABNORMAL HIGH (ref 0–35)
AST: 104 U/L — ABNORMAL HIGH (ref 0–37)
Albumin: 2.5 g/dL — ABNORMAL LOW (ref 3.5–5.2)
Alkaline Phosphatase: 169 U/L — ABNORMAL HIGH (ref 39–117)
BUN: 33 mg/dL — ABNORMAL HIGH (ref 6–23)
CO2: 19 mEq/L (ref 19–32)
Calcium: 8.1 mg/dL — ABNORMAL LOW (ref 8.4–10.5)
Chloride: 100 mEq/L (ref 96–112)
Creatinine, Ser: 1.9 mg/dL — ABNORMAL HIGH (ref 0.50–1.10)
GFR calc Af Amer: 30 mL/min — ABNORMAL LOW (ref >90)
GFR calc non Af Amer: 26 mL/min — ABNORMAL LOW (ref >90)
Glucose, Bld: 93 mg/dL (ref 70–99)
Potassium: 3.5 mEq/L (ref 3.5–5.1)
Sodium: 133 mEq/L — ABNORMAL LOW (ref 135–145)
Total Bilirubin: 0.9 mg/dL (ref 0.3–1.2)
Total Protein: 5.1 g/dL — ABNORMAL LOW (ref 6.0–8.3)

## 2012-04-04 LAB — MAGNESIUM
Magnesium: 1 mg/dL — ABNORMAL LOW (ref 1.5–2.5)
Magnesium: 1.2 mg/dL — ABNORMAL LOW (ref 1.5–2.5)

## 2012-04-04 LAB — BLOOD GAS, ARTERIAL
Acid-base deficit: 9.3 mmol/L — ABNORMAL HIGH (ref 0.0–2.0)
Bicarbonate: 15.5 mEq/L — ABNORMAL LOW (ref 20.0–24.0)
Drawn by: 36496
FIO2: 0.44 %
O2 Saturation: 93.2 %
Patient temperature: 98.6
TCO2: 16.4 mmol/L (ref 0–100)
pCO2 arterial: 30.2 mmHg — ABNORMAL LOW (ref 35.0–45.0)
pH, Arterial: 7.329 — ABNORMAL LOW (ref 7.350–7.450)
pO2, Arterial: 69.1 mmHg — ABNORMAL LOW (ref 80.0–100.0)

## 2012-04-04 LAB — CBC
HCT: 37.5 % (ref 36.0–46.0)
Hemoglobin: 12.3 g/dL (ref 12.0–15.0)
MCH: 28.9 pg (ref 26.0–34.0)
MCHC: 32.8 g/dL (ref 30.0–36.0)
MCV: 88.2 fL (ref 78.0–100.0)
Platelets: 152 10*3/uL (ref 150–400)
RBC: 4.25 MIL/uL (ref 3.87–5.11)
RDW: 15.2 % (ref 11.5–15.5)
WBC: 5.1 10*3/uL (ref 4.0–10.5)

## 2012-04-04 LAB — TYPE AND SCREEN
ABO/RH(D): O POS
Antibody Screen: NEGATIVE

## 2012-04-04 LAB — BASIC METABOLIC PANEL
BUN: 34 mg/dL — ABNORMAL HIGH (ref 6–23)
BUN: 37 mg/dL — ABNORMAL HIGH (ref 6–23)
CO2: 15 mEq/L — ABNORMAL LOW (ref 19–32)
CO2: 15 mEq/L — ABNORMAL LOW (ref 19–32)
Calcium: 7.2 mg/dL — ABNORMAL LOW (ref 8.4–10.5)
Calcium: 6.9 mg/dL — ABNORMAL LOW (ref 8.4–10.5)
Chloride: 103 mEq/L (ref 96–112)
Chloride: 104 mEq/L (ref 96–112)
Creatinine, Ser: 2 mg/dL — ABNORMAL HIGH (ref 0.50–1.10)
Creatinine, Ser: 2.3 mg/dL — ABNORMAL HIGH (ref 0.50–1.10)
GFR calc Af Amer: 29 mL/min — ABNORMAL LOW (ref >90)
GFR calc Af Amer: 24 mL/min — ABNORMAL LOW (ref >90)
GFR calc non Af Amer: 25 mL/min — ABNORMAL LOW (ref >90)
GFR calc non Af Amer: 21 mL/min — ABNORMAL LOW (ref >90)
Glucose, Bld: 188 mg/dL — ABNORMAL HIGH (ref 70–99)
Glucose, Bld: 153 mg/dL — ABNORMAL HIGH (ref 70–99)
Potassium: 3.9 mEq/L (ref 3.5–5.1)
Potassium: 3.7 mEq/L (ref 3.5–5.1)
Sodium: 132 mEq/L — ABNORMAL LOW (ref 135–145)
Sodium: 134 mEq/L — ABNORMAL LOW (ref 135–145)

## 2012-04-04 LAB — PHOSPHORUS
Phosphorus: 2.2 mg/dL — ABNORMAL LOW (ref 2.3–4.6)
Phosphorus: 4.2 mg/dL (ref 2.3–4.6)

## 2012-04-04 LAB — CARBOXYHEMOGLOBIN
Carboxyhemoglobin: 1 % (ref 0.5–1.5)
Carboxyhemoglobin: 0.6 % (ref 0.5–1.5)
Methemoglobin: 1.5 % (ref 0.0–1.5)
Methemoglobin: 1.1 % (ref 0.0–1.5)
O2 Saturation: 70.3 %
O2 Saturation: 73.2 %
Total hemoglobin: 12.3 g/dL (ref 12.0–16.0)
Total hemoglobin: 14.1 g/dL (ref 12.0–16.0)

## 2012-04-04 LAB — GLUCOSE, CAPILLARY
Glucose-Capillary: 107 mg/dL — ABNORMAL HIGH (ref 70–99)
Glucose-Capillary: 125 mg/dL — ABNORMAL HIGH (ref 70–99)
Glucose-Capillary: 115 mg/dL — ABNORMAL HIGH (ref 70–99)
Glucose-Capillary: 115 mg/dL — ABNORMAL HIGH (ref 70–99)
Glucose-Capillary: 163 mg/dL — ABNORMAL HIGH (ref 70–99)
Glucose-Capillary: 193 mg/dL — ABNORMAL HIGH (ref 70–99)

## 2012-04-04 LAB — ABO/RH: ABO/RH(D): O POS

## 2012-04-04 LAB — PROCALCITONIN: Procalcitonin: 82.91 ng/mL

## 2012-04-04 LAB — PRO B NATRIURETIC PEPTIDE: Pro B Natriuretic peptide (BNP): 8289 pg/mL — ABNORMAL HIGH (ref 0–125)

## 2012-04-04 LAB — LACTIC ACID, PLASMA
Lactic Acid, Venous: 3 mmol/L — ABNORMAL HIGH (ref 0.5–2.2)
Lactic Acid, Venous: 3.8 mmol/L — ABNORMAL HIGH (ref 0.5–2.2)

## 2012-04-04 LAB — FIBRINOGEN: Fibrinogen: 389 mg/dL (ref 204–475)

## 2012-04-04 LAB — MRSA PCR SCREENING: MRSA by PCR: NEGATIVE

## 2012-04-04 LAB — AMYLASE: Amylase: 19 U/L (ref 0–105)

## 2012-04-04 LAB — LIPASE, BLOOD: Lipase: 9 U/L — ABNORMAL LOW (ref 11–59)

## 2012-04-04 SURGERY — CYSTOSCOPY, WITH RETROGRADE PYELOGRAM AND URETERAL STENT INSERTION
Anesthesia: General | Site: Ureter | Laterality: Left | Wound class: Clean Contaminated

## 2012-04-04 MED ORDER — PHENYLEPHRINE HCL 10 MG/ML IJ SOLN: 30.0000 ug/min | INTRAVENOUS | Status: DC | PRN

## 2012-04-04 MED ORDER — ALBUTEROL SULFATE HFA 108 (90 BASE) MCG/ACT IN AERS
6.0000 | INHALATION_SPRAY | RESPIRATORY_TRACT | Status: DC | PRN
  Filled 2012-04-04: qty 6.7

## 2012-04-04 MED ORDER — SODIUM CHLORIDE 0.9 % IV SOLN
50.0000 ug/h | INTRAVENOUS | Status: DC
  Administered 2012-04-04: 50 ug/h via INTRAVENOUS
  Filled 2012-04-04 (×2): qty 50

## 2012-04-04 MED ORDER — ROCURONIUM BROMIDE 100 MG/10ML IV SOLN
INTRAVENOUS | Status: DC | PRN
  Administered 2012-04-04: 50 mg via INTRAVENOUS

## 2012-04-04 MED ORDER — FAMOTIDINE IN NACL 20-0.9 MG/50ML-% IV SOLN
20.0000 mg | Freq: Two times a day (BID) | INTRAVENOUS | Status: DC
  Administered 2012-04-04 (×2): 20 mg via INTRAVENOUS
  Filled 2012-04-04 (×4): qty 50

## 2012-04-04 MED ORDER — SODIUM BICARBONATE 8.4 % IV SOLN
INTRAVENOUS | Status: DC
  Administered 2012-04-04 – 2012-04-05 (×2): via INTRAVENOUS
  Filled 2012-04-04 (×3): qty 100

## 2012-04-04 MED ORDER — VANCOMYCIN HCL IN DEXTROSE 1-5 GM/200ML-% IV SOLN
1000.0000 mg | INTRAVENOUS | Status: DC
  Administered 2012-04-04: 1000 mg via INTRAVENOUS
  Filled 2012-04-04: qty 200

## 2012-04-04 MED ORDER — SODIUM CHLORIDE 0.9 % IV BOLUS (SEPSIS)
500.0000 mL | Freq: Once | INTRAVENOUS | Status: AC
  Administered 2012-04-04: 500 mL via INTRAVENOUS

## 2012-04-04 MED ORDER — FENTANYL CITRATE 0.05 MG/ML IJ SOLN
INTRAMUSCULAR | Status: DC | PRN
  Administered 2012-04-04: 25 ug via INTRAVENOUS

## 2012-04-04 MED ORDER — FENTANYL BOLUS VIA INFUSION
50.0000 ug | Freq: Four times a day (QID) | INTRAVENOUS | Status: DC | PRN
  Filled 2012-04-04: qty 100

## 2012-04-04 MED ORDER — ASPIRIN 300 MG RE SUPP: 300.0000 mg | RECTAL | Status: AC

## 2012-04-04 MED ORDER — SODIUM CHLORIDE 0.9 % IV BOLUS (SEPSIS): 25.0000 mL/kg | Freq: Once | INTRAVENOUS | Status: DC

## 2012-04-04 MED ORDER — DEXTROSE 5 % IV SOLN: 500.0000 mg | INTRAVENOUS | Status: DC

## 2012-04-04 MED ORDER — CISATRACURIUM BOLUS VIA INFUSION
5.0000 mg | Freq: Once | INTRAVENOUS | Status: DC
  Filled 2012-04-04: qty 5

## 2012-04-04 MED ORDER — DEXTROSE 5 % IV SOLN: 1.0000 g | INTRAVENOUS | Status: DC

## 2012-04-04 MED ORDER — DEXTROSE 5 % IV SOLN
1.0000 g | Freq: Once | INTRAVENOUS | Status: AC
  Administered 2012-04-04: 1 g via INTRAVENOUS
  Filled 2012-04-04: qty 10

## 2012-04-04 MED ORDER — NOREPINEPHRINE BITARTRATE 1 MG/ML IJ SOLN
2.0000 ug/min | INTRAVENOUS | Status: DC | PRN
  Administered 2012-04-04: 15 ug/min via INTRAVENOUS
  Administered 2012-04-04: 30 ug/min via INTRAVENOUS
  Filled 2012-04-04 (×2): qty 4

## 2012-04-04 MED ORDER — PIPERACILLIN-TAZOBACTAM 3.375 G IVPB 30 MIN: 3.3750 g | Freq: Once | INTRAVENOUS | Status: DC

## 2012-04-04 MED ORDER — LEVOFLOXACIN IN D5W 750 MG/150ML IV SOLN
750.0000 mg | INTRAVENOUS | Status: DC
  Administered 2012-04-04: 750 mg via INTRAVENOUS
  Filled 2012-04-04 (×2): qty 150

## 2012-04-04 MED ORDER — CISATRACURIUM BESYLATE 10 MG/ML IV SOLN
3.0000 ug/kg/min | INTRAVENOUS | Status: DC
  Filled 2012-04-04: qty 20

## 2012-04-04 MED ORDER — MIDAZOLAM HCL 2 MG/2ML IJ SOLN: 1.0000 mg | Freq: Four times a day (QID) | INTRAMUSCULAR | Status: DC

## 2012-04-04 MED ORDER — SODIUM CHLORIDE 0.9 % IV SOLN: 250.0000 mL | INTRAVENOUS | Status: DC | PRN

## 2012-04-04 MED ORDER — MIDAZOLAM HCL 2 MG/2ML IJ SOLN
1.0000 mg | INTRAMUSCULAR | Status: DC | PRN
  Administered 2012-04-04 (×2): 2 mg via INTRAVENOUS
  Filled 2012-04-04 (×2): qty 2

## 2012-04-04 MED ORDER — FENTANYL CITRATE 0.05 MG/ML IJ SOLN
25.0000 ug | INTRAMUSCULAR | Status: DC | PRN
  Administered 2012-04-07 – 2012-04-14 (×24): 50 ug via INTRAVENOUS
  Filled 2012-04-04 (×23): qty 2

## 2012-04-04 MED ORDER — SODIUM CHLORIDE 0.9 % IV BOLUS (SEPSIS)
1000.0000 mL | Freq: Once | INTRAVENOUS | Status: AC
  Administered 2012-04-04: 1000 mL via INTRAVENOUS

## 2012-04-04 MED ORDER — HYDROCORTISONE SOD SUCCINATE 100 MG IJ SOLR
50.0000 mg | Freq: Four times a day (QID) | INTRAMUSCULAR | Status: DC
  Filled 2012-04-04: qty 1

## 2012-04-04 MED ORDER — SODIUM CHLORIDE 0.9 % IV SOLN: INTRAVENOUS | Status: DC

## 2012-04-04 MED ORDER — VANCOMYCIN HCL 1000 MG IV SOLR: 1500.0000 mg | INTRAVENOUS | Status: DC

## 2012-04-04 MED ORDER — ASPIRIN 81 MG PO CHEW: 324.0000 mg | CHEWABLE_TABLET | ORAL | Status: AC

## 2012-04-04 MED ORDER — ARTIFICIAL TEARS OP OINT
1.0000 "application " | TOPICAL_OINTMENT | Freq: Three times a day (TID) | OPHTHALMIC | Status: DC
  Filled 2012-04-04 (×2): qty 3.5

## 2012-04-04 MED ORDER — IOHEXOL 300 MG/ML  SOLN
INTRAMUSCULAR | Status: DC | PRN
  Administered 2012-04-04: 8 mL via INTRAVENOUS

## 2012-04-04 MED ORDER — SODIUM CHLORIDE 0.9 % IV SOLN
INTRAVENOUS | Status: DC | PRN
  Administered 2012-04-04: 05:00:00 via INTRAVENOUS

## 2012-04-04 MED ORDER — MAGNESIUM SULFATE 40 MG/ML IJ SOLN
4.0000 g | Freq: Once | INTRAMUSCULAR | Status: AC
  Administered 2012-04-04: 4 g via INTRAVENOUS
  Filled 2012-04-04: qty 100

## 2012-04-04 MED ORDER — NOREPINEPHRINE BITARTRATE 1 MG/ML IJ SOLN
2.0000 ug/min | INTRAVENOUS | Status: DC
  Administered 2012-04-04: 70 ug/min via INTRAVENOUS
  Administered 2012-04-05: 35 ug/min via INTRAVENOUS
  Administered 2012-04-05: 26 ug/min via INTRAVENOUS
  Administered 2012-04-05: 35 ug/min via INTRAVENOUS
  Filled 2012-04-04 (×6): qty 16

## 2012-04-04 MED ORDER — DEXTROSE 5 % IV SOLN
500.0000 mg | INTRAVENOUS | Status: DC
  Filled 2012-04-04: qty 500

## 2012-04-04 MED ORDER — SODIUM CHLORIDE 0.9 % IV SOLN
INTRAVENOUS | Status: DC
  Administered 2012-04-04: 03:00:00 via INTRAVENOUS
  Administered 2012-04-08: 20 mL/h via INTRAVENOUS
  Administered 2012-04-09 – 2012-04-12 (×3): 500 mL via INTRAVENOUS

## 2012-04-04 MED ORDER — BIOTENE DRY MOUTH MT LIQD
15.0000 mL | Freq: Four times a day (QID) | OROMUCOSAL | Status: DC
  Administered 2012-04-04 – 2012-04-13 (×37): 15 mL via OROMUCOSAL

## 2012-04-04 MED ORDER — HEPARIN SODIUM (PORCINE) 5000 UNIT/ML IJ SOLN
5000.0000 [IU] | Freq: Three times a day (TID) | INTRAMUSCULAR | Status: DC
  Filled 2012-04-04 (×4): qty 1

## 2012-04-04 MED ORDER — SODIUM BICARBONATE 4.2 % IV SOLN
INTRAVENOUS | Status: DC | PRN
  Administered 2012-04-04: 50 meq via INTRAVENOUS

## 2012-04-04 MED ORDER — CHLORHEXIDINE GLUCONATE 0.12 % MT SOLN
15.0000 mL | Freq: Two times a day (BID) | OROMUCOSAL | Status: DC
  Administered 2012-04-04 – 2012-04-12 (×17): 15 mL via OROMUCOSAL
  Filled 2012-04-04 (×17): qty 15

## 2012-04-04 MED ORDER — ETOMIDATE 2 MG/ML IV SOLN
INTRAVENOUS | Status: AC
  Administered 2012-04-04: 20 mg
  Filled 2012-04-04: qty 10

## 2012-04-04 MED ORDER — ARTIFICIAL TEARS OP OINT
TOPICAL_OINTMENT | OPHTHALMIC | Status: DC | PRN
  Administered 2012-04-04: 1 via OPHTHALMIC

## 2012-04-04 MED ORDER — INFLUENZA VIRUS VACC SPLIT PF IM SUSP
0.5000 mL | INTRAMUSCULAR | Status: AC
  Filled 2012-04-04: qty 0.5

## 2012-04-04 MED ORDER — SODIUM CHLORIDE 0.9 % IR SOLN
Status: DC | PRN
  Administered 2012-04-04: 3000 mL via INTRAVESICAL

## 2012-04-04 MED ORDER — DEXTROSE 5 % IV SOLN
2.0000 g | INTRAVENOUS | Status: DC
  Filled 2012-04-04: qty 2

## 2012-04-04 MED ORDER — NOREPINEPHRINE BITARTRATE 1 MG/ML IJ SOLN
2.0000 ug/min | INTRAVENOUS | Status: DC
  Filled 2012-04-04: qty 8

## 2012-04-04 MED ORDER — DOBUTAMINE IN D5W 4-5 MG/ML-% IV SOLN
2.5000 ug/kg/min | INTRAVENOUS | Status: DC | PRN
  Administered 2012-04-04: 2.5 ug/kg/min via INTRAVENOUS
  Filled 2012-04-04 (×2): qty 250

## 2012-04-04 MED ORDER — SODIUM CHLORIDE 0.9 % IV BOLUS (SEPSIS): 500.0000 mL | INTRAVENOUS | Status: DC | PRN

## 2012-04-04 MED ORDER — HYDROCORTISONE SOD SUCCINATE 100 MG IJ SOLR
50.0000 mg | Freq: Once | INTRAMUSCULAR | Status: AC
  Administered 2012-04-04: 50 mg via INTRAVENOUS
  Filled 2012-04-04: qty 1

## 2012-04-04 MED ORDER — DEXTROSE 5 % IV SOLN
2.0000 g | INTRAVENOUS | Status: DC
  Administered 2012-04-04 – 2012-04-05 (×2): 2 g via INTRAVENOUS
  Filled 2012-04-04 (×3): qty 2

## 2012-04-04 MED ORDER — VASOPRESSIN 20 UNIT/ML IJ SOLN
0.0300 [IU]/min | INTRAVENOUS | Status: DC | PRN
  Administered 2012-04-04: 0.03 [IU]/min via INTRAVENOUS
  Filled 2012-04-04 (×3): qty 2.5

## 2012-04-04 MED ORDER — MIDAZOLAM BOLUS VIA INFUSION
1.0000 mg | INTRAVENOUS | Status: DC | PRN
  Filled 2012-04-04: qty 2

## 2012-04-04 MED ORDER — HYDROCORTISONE SOD SUCCINATE 100 MG IJ SOLR: 50.0000 mg | Freq: Once | INTRAMUSCULAR | Status: DC

## 2012-04-04 MED ORDER — MIDAZOLAM HCL 5 MG/5ML IJ SOLN
INTRAMUSCULAR | Status: DC | PRN
  Administered 2012-04-04: 2 mg via INTRAVENOUS

## 2012-04-04 MED ORDER — DEXTROSE 5 % IV SOLN
500.0000 mg | Freq: Once | INTRAVENOUS | Status: AC
  Administered 2012-04-04: 500 mg via INTRAVENOUS
  Filled 2012-04-04: qty 500

## 2012-04-04 MED ORDER — SODIUM CHLORIDE 0.9 % IV BOLUS (SEPSIS): 500.0000 mL | Freq: Once | INTRAVENOUS | Status: DC

## 2012-04-04 MED ORDER — SODIUM CHLORIDE 0.9 % IV SOLN
1.0000 mg/h | INTRAVENOUS | Status: DC
  Administered 2012-04-04: 2 mg/h via INTRAVENOUS
  Administered 2012-04-04 – 2012-04-06 (×4): 6 mg/h via INTRAVENOUS
  Filled 2012-04-04 (×6): qty 10

## 2012-04-04 SURGICAL SUPPLY — 33 items
ADAPTER CATH URET PLST 4-6FR (CATHETERS) ×1 IMPLANT
ADPR CATH URET STRL DISP 4-6FR (CATHETERS) ×1
APL SKNCLS STERI-STRIP NONHPOA (GAUZE/BANDAGES/DRESSINGS) ×1
BAG URINE DRAINAGE (UROLOGICAL SUPPLIES) ×2 IMPLANT
BAG URO CATCHER STRL LF (DRAPE) ×2 IMPLANT
BENZOIN TINCTURE PRP APPL 2/3 (GAUZE/BANDAGES/DRESSINGS) ×1 IMPLANT
BUCKET BIOHAZARD WASTE 5 GAL (MISCELLANEOUS) ×1 IMPLANT
CATH FOLEY 2WAY SLVR  5CC 16FR (CATHETERS) ×1
CATH FOLEY 2WAY SLVR 5CC 16FR (CATHETERS) IMPLANT
CATH INTERMIT  6FR 70CM (CATHETERS) ×1 IMPLANT
CATH URET 5FR 28IN CONE TIP (BALLOONS) ×1
CATH URET 5FR 70CM CONE TIP (BALLOONS) IMPLANT
CLOTH BEACON ORANGE TIMEOUT ST (SAFETY) ×2 IMPLANT
CONTOUR STENT ×1 IMPLANT
COVER SURGICAL LIGHT HANDLE (MISCELLANEOUS) ×2 IMPLANT
DRAPE CAMERA CLOSED 9X96 (DRAPES) ×2 IMPLANT
GLOVE BIO SURGEON STRL SZ7.5 (GLOVE) ×2 IMPLANT
GOWN PREVENTION PLUS XLARGE (GOWN DISPOSABLE) ×4 IMPLANT
GUIDEWIRE ANG ZIPWIRE 038X150 (WIRE) ×1 IMPLANT
GUIDEWIRE COOK  .035 (WIRE) ×1 IMPLANT
GUIDEWIRE STR DUAL SENSOR (WIRE) ×1 IMPLANT
KIT ROOM TURNOVER OR (KITS) ×2 IMPLANT
MANIFOLD NEPTUNE II (INSTRUMENTS) ×1 IMPLANT
NS IRRIG 1000ML POUR BTL (IV SOLUTION) ×2 IMPLANT
PACK CYSTOSCOPY (CUSTOM PROCEDURE TRAY) ×2 IMPLANT
PAD ARMBOARD 7.5X6 YLW CONV (MISCELLANEOUS) ×4 IMPLANT
PLUG CATH AND CAP STER (CATHETERS) ×1 IMPLANT
STENT URET 6FRX24 CONTOUR (STENTS) ×1 IMPLANT
SYR CONTROL 10ML LL (SYRINGE) ×2 IMPLANT
SYRINGE TOOMEY DISP (SYRINGE) IMPLANT
UNDERPAD 30X30 INCONTINENT (UNDERPADS AND DIAPERS) ×2 IMPLANT
WATER STERILE IRR 1000ML POUR (IV SOLUTION) ×2 IMPLANT
WIRE COONS/BENSON .038X145CM (WIRE) ×1 IMPLANT

## 2012-04-04 NOTE — Procedures (Signed)
Intubation Procedure Note LAVEYA REHRER [id number] 22-Aug-1945  Procedure: Intubation Indications: Respiratory insufficiency  Procedure Details Consent: Unable to obtain consent because of emergent medical necessity. Time Out: Verified patient identification, verified procedure, site/side was marked, verified correct patient position, special equipment/implants available, medications/allergies/relevent history reviewed, required imaging and test results available.  Performed  Drugs: etomidate 20mg  IV, rocuronium 50mg  IV Cords visualized x1 with Glidescope blade 3, grade 1 view, 7.5 ETT easily passed through cords without difficulty Placement confirmed with smoke in tube, bilateral breath sounds, positive color change in ET CO2 detector   Evaluation Hemodynamic Status: BP stable throughout; O2 sats: stable throughout Patient's Current Condition: stable Complications: No apparent complications Patient did tolerate procedure well. Chest X-ray ordered to verify placement.  CXR: pending.   Curtis Uriarte 04/04/2012

## 2012-04-04 NOTE — Progress Notes (Addendum)
ANTIBIOTIC CONSULT NOTE - INITIAL  Pharmacy Consult for vancomycin, ceftriaxone Indication: Septic shock likely from pyelonephritis, r/o PNA  No Known Allergies  Patient Measurements: Height: 5\' 4"  (162.6 cm) Weight: 236 lb 12.4 oz (107.4 kg) IBW/kg (Calculated) : 54.7   Vital Signs: Temp: 98.8 F (37.1 C) (10/16 0120) Temp src: Rectal (10/15 2204) BP: 101/60 mmHg (10/16 0120) Pulse Rate: 117  (10/16 0120) Intake/Output from previous day:   Intake/Output from this shift:    Labs:  [facility] 04/03/12 2221  WBC 5.3  HGB 12.2  PLT 131*  LABCREA --  CREATININE 1.90*   Estimated Creatinine Clearance: 34.4 ml/min (by C-G formula based on Cr of 1.9). No results found for this basename: VANCOTROUGH:2,VANCOPEAK:2,VANCORANDOM:2,GENTTROUGH:2,GENTPEAK:2,GENTRANDOM:2,TOBRATROUGH:2,TOBRAPEAK:2,TOBRARND:2,AMIKACINPEAK:2,AMIKACINTROU:2,AMIKACIN:2, in the last 72 hours   Microbiology: No results found for this or any previous visit (from the past 720 hour(s)).  Medical History: Past Medical History  Diagnosis Date  . Hypertension     Medications:  Scheduled:    . acetaminophen  650 mg Rectal Once  . aspirin  324 mg Oral NOW   Or  . aspirin  300 mg Rectal NOW  . azithromycin  500 mg Intravenous Once  . azithromycin  500 mg Intravenous Q24H  . cefTRIAXone (ROCEPHIN)  IV  1 g Intravenous Once  . cefTRIAXone (ROCEPHIN)  IV  1 g Intravenous Q24H  . dextrose      . heparin  5,000 Units Subcutaneous Q8H  . hydrocortisone sodium succinate  50 mg Intravenous Once  . hydrocortisone sod succinate (SOLU-CORTEF) injection  50 mg Intravenous Q6H  . influenza  inactive virus vaccine  0.5 mL Intramuscular Tomorrow-1000  . norepinephrine      . norepinephrine (LEVOPHED) Adult infusion  2-50 mcg/min Intravenous Once  . piperacillin-tazobactam (ZOSYN)  IV  3.375 g Intravenous Once  . sodium chloride  1,000 mL Intravenous Once  . sodium chloride  1,000 mL Intravenous Once  . sodium  chloride  1,000 mL Intravenous Once  . sodium chloride  1,000 mL Intravenous Once  . sodium chloride  25 mL/kg Intravenous Once  . sodium chloride  500 mL Intravenous Once  . sodium chloride  500 mL Intravenous Once  . vancomycin  1,500 mg Intravenous Once  . DISCONTD: piperacillin-tazobactam  3.375 g Intravenous Once   Assessment: 66 yo female admitted with septic shock, likely from pyelonephritis and possible severe CAP. Pharmacy consulted to manage vancomycin. Patient has already received 1.5gm IV vancomycin x 1. Patient also to receive azithromycin and ceftriaxone.   Goal of Therapy:  Vancomycin trough 10-20 mcg/mL.   Plan:  1. Vancomycin 1gm IV Q24H.   Otila Back 04/04/2012,2:22 AM  Addendum: Pharmacy consulted to manage ceftriaxone. Ceftriaxone 2gm IV Q24H.  Raye Sorrow, PharmD 04/04/12, 02:48 am

## 2012-04-04 NOTE — Progress Notes (Signed)
INITIAL ADULT NUTRITION ASSESSMENT Date: 04/04/2012   Time: 12:51 PM  Reason for Assessment: VDRF  INTERVENTION:  Recommend start TF via OG or NG tube with Oxepa at 20 ml/h (goal rate) with Prostat 60 ml TID to provide 1320 kcals (64% of estimated needs), 120 gm protein, and 377 ml free water daily  DOCUMENTATION CODES Per approved criteria  -Morbid Obesity   ASSESSMENT: Female 66 y.o.  Dx: Septic shock  Hx:  Past Medical History  Diagnosis Date  . Hypertension   . Pneumonia   . GERD (gastroesophageal reflux disease)   . Headache     Past Surgical History  Procedure Date  . Abdominal hysterectomy   . Cholecystectomy     Related Meds:  Scheduled Meds:   . acetaminophen  650 mg Rectal Once  . antiseptic oral rinse  15 mL Mouth Rinse QID  . artificial tears  1 application Both Eyes [id number]  . aspirin  324 mg Oral NOW   Or  . aspirin  300 mg Rectal NOW  . azithromycin  500 mg Intravenous Once  . cefTRIAXone (ROCEPHIN)  IV  1 g Intravenous Once  . chlorhexidine  15 mL Mouth Rinse BID  . cisatracurium  5 mg Intravenous Once  . dextrose      . etomidate      . famotidine (PEPCID) IV  20 mg Intravenous Q12H  . hydrocortisone sodium succinate  50 mg Intravenous Once  . influenza  inactive virus vaccine  0.5 mL Intramuscular Tomorrow-1000  . magnesium sulfate 1 - 4 g bolus IVPB  4 g Intravenous Once  . norepinephrine      . norepinephrine (LEVOPHED) Adult infusion  2-50 mcg/min Intravenous Once  . piperacillin-tazobactam (ZOSYN)  IV  3.375 g Intravenous Once  . sodium chloride  1,000 mL Intravenous Once  . sodium chloride  1,000 mL Intravenous Once  . sodium chloride  1,000 mL Intravenous Once  . sodium chloride  1,000 mL Intravenous Once  . sodium chloride  1,000 mL Intravenous Once  . sodium chloride  25 mL/kg Intravenous Once  . sodium chloride  500 mL Intravenous Once  . sodium chloride  500 mL Intravenous Once  . vancomycin  1,500 mg Intravenous Once  .  vancomycin  1,000 mg Intravenous Q24H  . DISCONTD: azithromycin  500 mg Intravenous Q24H  . DISCONTD: azithromycin  500 mg Intravenous Q24H  . DISCONTD: cefTRIAXone (ROCEPHIN)  IV  1 g Intravenous Q24H  . DISCONTD: cefTRIAXone (ROCEPHIN)  IV  2 g Intravenous Q24H  . DISCONTD: heparin  5,000 Units Subcutaneous Q8H  . DISCONTD: hydrocortisone sodium succinate  50 mg Intravenous Once  . DISCONTD: hydrocortisone sod succinate (SOLU-CORTEF) injection  50 mg Intravenous Q6H  . DISCONTD: midazolam  1 mg Intravenous Q6H  . DISCONTD: piperacillin-tazobactam  3.375 g Intravenous Once   Continuous Infusions:   . sodium chloride 20 mL/hr at 04/04/12 1100  . sodium chloride    . cisatracurium (NIMBEX) infusion    . fentaNYL infusion INTRAVENOUS    .  sodium bicarbonate infusion 1000 mL    . vasopressin (PITRESSIN) infusion - *FOR SHOCK* 0.03 Units/min (04/04/12 1000)  . DISCONTD: fentaNYL infusion INTRAVENOUS 25 mcg/hr (04/04/12 1115)   PRN Meds:.sodium chloride, albuterol, DOBUTamine, fentaNYL, fentaNYL, midazolam, norepinephrine (LEVOPHED) Adult infusion, sodium chloride, vasopressin (PITRESSIN) infusion - *FOR SHOCK*, DISCONTD: fentaNYL, DISCONTD: iohexol, DISCONTD: phenylephrine (NEO-SYNEPHRINE) Adult infusion, DISCONTD: sodium chloride irrigation   Ht: 5\' 4"  (162.6 cm)  Wt: 236 lb 12.4 oz (  107.4 kg)  Ideal Wt: 54.5 kg % Ideal Wt: 197%  Wt Readings from Last 10 Encounters:  04/04/12 236 lb 12.4 oz (107.4 kg)  04/04/12 236 lb 12.4 oz (107.4 kg)   Usual Wt: unknown  Body mass index is 40.64 kg/(m^2). Extreme Obesity, class III  Food/Nutrition Related Hx: No nutrition problems identified on admission nutrition screen.  Labs:  CMP     Component Value Date/Time   NA 136 04/04/2012 0558   K 4.1 04/04/2012 0558   CL 103 04/04/2012 0428   CO2 15* 04/04/2012 0428   GLUCOSE 188* 04/04/2012 0428   BUN 34* 04/04/2012 0428   CREATININE 2.00* 04/04/2012 0428   CALCIUM 7.2* 04/04/2012  0428   PROT 5.2* 04/04/2012 0428   ALBUMIN 2.4* 04/04/2012 0428   AST 127* 04/04/2012 0428   ALT 80* 04/04/2012 0428   ALKPHOS 209* 04/04/2012 0428   BILITOT 0.9 04/04/2012 0428   GFRNONAA 25* 04/04/2012 0428   GFRAA 29* 04/04/2012 0428    CBG (last 3)   Basename 04/04/12 1227 04/04/12 0750 04/04/12 0358  GLUCAP 115* 115* 125*    Sodium  Date/Time Value Range Status  04/04/2012  5:58 AM 136  135 - 145 mEq/L Final  04/04/2012  4:28 AM 132* 135 - 145 mEq/L Final  04/03/2012 10:21 PM 133* 135 - 145 mEq/L Final    Potassium  Date/Time Value Range Status  04/04/2012  5:58 AM 4.1  3.5 - 5.1 mEq/L Final  04/04/2012  4:28 AM 3.9  3.5 - 5.1 mEq/L Final  04/03/2012 10:21 PM 3.5  3.5 - 5.1 mEq/L Final    Phosphorus  Date/Time Value Range Status  04/04/2012  4:28 AM 4.2  2.3 - 4.6 mg/dL Final  04/04/2012  2:10 AM 2.2* 2.3 - 4.6 mg/dL Final    Magnesium  Date/Time Value Range Status  04/04/2012  4:28 AM 1.2* 1.5 - 2.5 mg/dL Final  04/04/2012  2:10 AM 1.0* 1.5 - 2.5 mg/dL Final     Intake/Output Summary (Last 24 hours) at 04/04/12 1303 Last data filed at 04/04/12 1044  Gross per 24 hour  Intake  803.7 ml  Output    258 ml  Net  545.7 ml    Diet Order: NPO   IVF:    sodium chloride Last Rate: 20 mL/hr at 04/04/12 1100  sodium chloride   cisatracurium (NIMBEX) infusion   fentaNYL infusion INTRAVENOUS   sodium bicarbonate infusion 1000 mL   vasopressin (PITRESSIN) infusion - *FOR SHOCK* Last Rate: 0.03 Units/min (04/04/12 1000)  DISCONTD: fentaNYL infusion INTRAVENOUS Last Rate: 25 mcg/hr (04/04/12 1115)    Estimated Nutritional Needs:   Kcal: 2050 Protein: 105-120 gm Fluid: 2.1-2.3 liters  Patient is S/P cystoscopy, left retrograde pyelogram, and left JJ stent this morning for impacted left upper ureteral stone.  Per discussion in rounds, plans to start TF soon.  No nutrition problems identified on admission except morbid obesity with BMI=40.6.  Patient is  currently intubated on ventilator support.  ARDS protocol is in place. MV: 11.9 Temp:Temp (24hrs), Avg:100.4 F (38 C), Min:97.9 F (36.6 C), Max:104.1 F (40.1 C)  NUTRITION DIAGNOSIS: -Inadequate oral intake (NI-2.1).  Status: Ongoing  RELATED TO: inability to eat  AS EVIDENCED BY: NPO status  MONITORING/EVALUATION(Goals):  Goal:  Enteral nutrition to provide 60-70% of estimated calorie needs (22-25 kcals/kg ideal body weight) and >/= 90% of estimated protein needs, based on ASPEN guidelines for permissive underfeeding in critically ill obese individuals.  Monitor:  TF initiation/tolerance/adequacy, weight  trend, labs, I/O  EDUCATION NEEDS: -Education not appropriate at this time   Molli Barrows, RD, LDN, [place] Pager# [phone] After Hours Pager# [phone]  04/04/2012, 12:51 PM

## 2012-04-04 NOTE — Progress Notes (Signed)
[facility] PCCM  I was called emergently to the bedside for management of marked respiratory distress.  Upon my arrival the patient was cyanotic, breathing more than 40 times a minute.  She was intubated easily, see note.  After confirmation of tube placement, she was markedly hypoxemic while very compliant with the ventilator.  ARDS vent protocol orders written.  CT from earlier this evening shows an obstructing stone on the left, which is the same side she has pyelonephritis by physical exam.  We have contacted urology for coordination of stent/perc drain.  Daughter updated at bedside.  Additional CC time outside procedures 40 minutes.  Jillyn Hidden PCCM Pager: [phone] Cell: [phone] If no response, call [phone]

## 2012-04-04 NOTE — Progress Notes (Signed)
To bedside for assessment.  Stat renal ultrasound reviewed with Dr. Reesa Chew - no evidence of hydronephrosis.  Will hold off on percutaneous drain at this time.  2D ECHO reviewed at bedside with reduced EF -approx 35-40%.  Noted last SvO2 of 73.    Plan: -add dobutamine -monitor UOP -change levophed to quad strength -wean levo for MAP >65 -repeat labs pending.   Noe Gens, NP-C [facility] Pgr: [phone] or [phone]

## 2012-04-04 NOTE — ED Notes (Signed)
Pt to CT scan with RN.

## 2012-04-04 NOTE — ED Notes (Signed)
Critical care MD at bedside 

## 2012-04-04 NOTE — Op Note (Signed)
Pre-operative diagnosis : Impacted 18mm Left upper ureteral stone @ UPJ with uroseptic shock  Postoperative diagnosis:Same  Operation:Cystoscopy, Left Retrograde Pyelogram, with interpretation, and Left JJ stent ( 59F x 24 cm).  Surgeon:  Chauncey Cruel. Gaynelle Arabian, MD  First assistant: None  Anesthesia:  general  Preparation: After appropriate pre-anesthesia, the patient was brought to the OR and placed upon the OR table in the supine position where GET anesthesia was introduced. Armband was double checked. The Left side was previously marked.   Review history:Urology Consult  Referring physician: Ramaswamy  Reason for referral: kidney stones, anuria  Chief Complaint: anuria, septic shock  History of Present Illness: HPI: This is a 66 y/o female with hypertension and nephrolithiasis who was admitted in the early AM of 10/16 to [facility] through the ED for septic shock. At 0100 on 10/15 she noticed the sudden onset of a sharp, "steady" pain similar to her prior episodes of nephrolithiasis. This pain progressed and she developed chills, nausea, vomiting, dysuria, and confusion. Her daughter called EMS when she noted confusion. She was found to be hypoxemic by EMS and in shock by the ED. The EDP also noted pyuria and L CVA tenderness. A central line was placed, vanc/zosyn was given and PCCM was consulted for admission. She notes some dyspnea but denies cough.    Statement of  Likelihood of Success: Excellent. TIME-OUT observed.:  Procedure: Cystoscopy showed normal-appearing urinary bladder, with normal trigone. No urine was observed from either orifice, however. A 3F ureteral catheter was inserted into the Left ureteral orifice and Left retrograde Pyelogram was accomplished, showing impacted Left upper ureteral stone at the UPJ. An .[id number] slick guidewire was inserted around the stone and coiled in the hydronephrotic renal pelvis. Over the wire, a 59F x 24 cm JJ stent was placed without difficulty. The suture was  detached. No bleeding was noted.    The bladder was drained of fluid, and a 159F foley catheter was placed, with 10cc water in the balloon. The patient remained stable and was awakened and taken to the PAR in stable condition.

## 2012-04-04 NOTE — Anesthesia Preprocedure Evaluation (Addendum)
Anesthesia Evaluation  Patient identified by MRN, date of birth, ID band Patient unresponsive    Reviewed: Allergy & Precautions, Patient's Chart, lab work & pertinent test results, Unable to perform ROS - Chart review only  History of Anesthesia Complications Negative for: history of anesthetic complications  Airway  TM Distance: >3 FB     Dental   Pulmonary  Intubated and ventilated from ICU breath sounds clear to auscultation  Pulmonary exam normal       Cardiovascular hypertension, Pt. on medications Rhythm:Regular Rate:Tachycardia  Presented in Septic shock Norepinephrine gtt   Neuro/Psych Acute encephalopathy with sepsis    GI/Hepatic GERD-  ,  Endo/Other  Morbid obesity  Renal/GU Renal InsufficiencyRenal disease (creat 2)   urosepsis    Musculoskeletal   Abdominal (+) + obese,   Peds  Hematology   Anesthesia Other Findings Intubated and ventilated, BBS=  Reproductive/Obstetrics                         Anesthesia Physical Anesthesia Plan  ASA: IV and Emergent  Anesthesia Plan: General   Post-op Pain Management:    Induction: Intravenous and Inhalational  Airway Management Planned: Oral ETT  Additional Equipment: Arterial line  Intra-op Plan:   Post-operative Plan: Post-operative intubation/ventilation  Informed Consent:   Only emergency history available  Plan Discussed with: CRNA and Surgeon  Anesthesia Plan Comments: (Plan routine monitors, A line, GETA via existing ETT Emergent consent obtained by surgeon)        Anesthesia Quick Evaluation

## 2012-04-04 NOTE — Progress Notes (Signed)
04/04/12 2000  Clinical Encounter Type  Visited With Patient and family together  Visit Type Spiritual support  Referral From Family  Spiritual Encounters  Spiritual Needs Emotional;Prayer  Stress Factors  Patient Stress Factors None identified  Family Stress Factors Health changes;Exhausted    Visited patient's mother and daughter [name]). I informed patient of pastoral overnight care resources if she would like another visit tonight.  I will follow up with the patient & family for support.  Chaplain Reyes Ivan Pager [phone]

## 2012-04-04 NOTE — Progress Notes (Signed)
[facility] Progress Note Patient Name: [name] DOB: 01-10-1946 MRN: [id number]  Date of Service  04/04/2012   HPI/Events of Note   D/w Dr Sherrin Daisy  eICU Interventions  Given CT findings Dr Katrine Coho called of UIrology. He wil evaluate for possible emerngent stent  Will send stat bmet - per RN very minimal urine output since admit   Intervention Category Intermediate Interventions: Other:  Xyon Lukasik 04/04/2012, 4:33 AM

## 2012-04-04 NOTE — Progress Notes (Signed)
[facility] Physician-Brief Progress Note Patient Name: LEANNE RAMETTA DOB: [birth year] MRN: [id number]  Date of Service  04/04/2012   HPI/Events of Note   Variable  0 Points 1 Point 2 Points Total  Heart rate per minute  <90 beats 90-109 beats >110 beats 2  Respiratory  Rate per minute < 18 breaths 19-30 breaths  >30 breaths 2  Restlessness; nonpurposeful movements None  occas slight movement Frequent movement 1  Paradoxical breathing pattern: None  Present 2  Accessory muscle use: rise in clavicle during inspiration None Slight rise Pronounced rise 1.5  Grunting at end-expiration: guttural sound None  Present 2  Nasal flaring: involuntary movement of nares None  Present 2  Look of fear None  Eyes wide 2  Overall total out of 16    14.5    Respiratory Distress Observation Scale Journal of Palliative Medicine Vol. 13, Number 3, 2010 Campbell et al.    eICU Interventions  Significant resp distress as above with 6L o2 need.   Called Dr Lake Bells for bedside eval and possible intubation   Intervention Category Major Interventions: Respiratory failure - evaluation and management  Randee Huston 04/04/2012, 3:52 AM

## 2012-04-04 NOTE — Progress Notes (Signed)
ANTIBIOTIC CONSULT NOTE - FOLLOW UP  Pharmacy Consult for vancomycin, ceftazidime, Levaquin Indication: Septic shock likely from pyelonephritis, r/o PNA  No Known Allergies  Patient Measurements: Height: 5\' 4"  (162.6 cm) Weight: 236 lb 12.4 oz (107.4 kg) IBW/kg (Calculated) : 54.7    Vital Signs: Temp: 98.5 F (36.9 C) (10/16 1235) Temp src: Oral (10/16 1235) BP: 86/55 mmHg (10/16 0900) Pulse Rate: 109  (10/16 1230) Intake/Output from previous day: 10/15 0701 - 10/16 0700 In: 400 [I.V.:100; IV Piggyback:300] Out: 150 [Urine:150] Intake/Output from this shift: Total I/O In: 403.7 [I.V.:153.7; IV Piggyback:250] Out: 108 [Urine:108]  Labs:  [facility] 04/04/12 0558 04/04/12 0428 04/03/12 2221  WBC -- 5.1 5.3  HGB 13.3 12.3 12.2  PLT -- 152 131*  LABCREA -- -- --  CREATININE -- 2.00* 1.90*   Estimated Creatinine Clearance: 32.7 ml/min (by C-G formula based on Cr of 2).   Microbiology: Recent Results (from the past 720 hour(s))  MRSA PCR SCREENING     Status: Normal   Collection Time   04/04/12  2:05 AM      Component Value Range Status Comment   MRSA by PCR NEGATIVE  NEGATIVE Final     Anti-infectives     Start     Dose/Rate Route Frequency Ordered Stop   04/04/12 2200   cefTRIAXone (ROCEPHIN) 1 g in dextrose 5 % 50 mL IVPB  Status:  Discontinued        1 g 100 mL/hr over 30 Minutes Intravenous Every 24 hours 04/04/12 0221 04/04/12 0248   04/04/12 2200   cefTRIAXone (ROCEPHIN) 2 g in dextrose 5 % 50 mL IVPB  Status:  Discontinued        2 g 100 mL/hr over 30 Minutes Intravenous Every 24 hours 04/04/12 0248 04/04/12 1212   04/04/12 2100   azithromycin (ZITHROMAX) 500 mg in dextrose 5 % 250 mL IVPB  Status:  Discontinued        500 mg 250 mL/hr over 60 Minutes Intravenous Every 24 hours 04/04/12 0221 04/04/12 1212   04/04/12 1200   vancomycin (VANCOCIN) IVPB 1000 mg/200 mL premix        1,000 mg 200 mL/hr over 60 Minutes Intravenous Every 24 hours 04/04/12  0229     04/04/12 0300   azithromycin (ZITHROMAX) 500 mg in dextrose 5 % 250 mL IVPB        500 mg 250 mL/hr over 60 Minutes Intravenous  Once 04/04/12 0221 04/04/12 0415   04/04/12 0245   azithromycin (ZITHROMAX) 500 mg in dextrose 5 % 250 mL IVPB  Status:  Discontinued        500 mg 250 mL/hr over 60 Minutes Intravenous Every 24 hours 04/04/12 0238 04/04/12 0243   04/04/12 0230   cefTRIAXone (ROCEPHIN) 1 g in dextrose 5 % 50 mL IVPB        1 g 100 mL/hr over 30 Minutes Intravenous  Once 04/04/12 0221 04/04/12 0312   04/04/12 0045   piperacillin-tazobactam (ZOSYN) IVPB 3.375 g  Status:  Discontinued        3.375 g 100 mL/hr over 30 Minutes Intravenous  Once 04/04/12 0044 04/04/12 0221   04/03/12 2230   vancomycin (VANCOCIN) 1,500 mg in sodium chloride 0.9 % 500 mL IVPB        1,500 mg 250 mL/hr over 120 Minutes Intravenous  Once 04/03/12 2208 04/04/12 0108   04/03/12 2215  piperacillin-tazobactam (ZOSYN) IVPB 3.375 g       3.375 g 12.5 mL/hr  over 240 Minutes Intravenous  Once 04/03/12 2208 04/04/12 0237          Assessment: 66 yo female admitted with septic shock, likely from pyelonephritis and possible severe CAP. Pharmacy consulted to manage vancomycin, ceftazidime, and levofloxacin. Day 2 for vancomycin. WBC 5.1, procalcitonin 82.91. Tmax 104.1.   Urine 10/15>> Blood 10/15>>  10/15: Vancomycin >> 10/16: Ceftazidime >> 10/16: Levaquin >> 10/15: Azithromycin >>10/16 10/15: Ceftriaxone >>10/16 10/15: Zosyn x 1>>10/15   Goal of Therapy:  Vancomycin trough 15-20 mcg/ml  Plan:  -Change vancomycin to 1500 mg IV Q24h -Start Ceftazidime 1 g IV Q12h -Start Levaquin 750 mg IV q48h -Monitor renal function, cultures, and clinical status -Discontinue azithromycin and ceftriaxone  Nicoletta Ba PharmD Candidate 04/04/2012,1:26 PM  Addendum:  Scr now 2.3, Est CrCl ~25-30 ml/min. Will adjust ABX to: Vancomycin 1500mg  IV q48h Ceftazidime 2gm IV q24h Levaquin 750mg  IV  q48h.  Legrand Como, Pharm.D., BCPS Clinical Pharmacist  Phone [phone] Pager [phone] 04/04/2012, 2:18 PM

## 2012-04-04 NOTE — ED Notes (Signed)
Central line confirmed via xray per MD Judithann Graves and ready for use.

## 2012-04-04 NOTE — H&P (Signed)
Name: [name] MRN: [id number] DOB: May 01, 1946    LOS: 1  Referring Provider:  Zenia Resides EDP Reason for Referral:  Septic shock  PULMONARY / CRITICAL CARE MEDICINE  HPI:  This is a 66 y/o female with hypertension and nephrolithiasis who was admitted in the early AM of 10/16 to [facility] through the ED for septic shock.  At 0100 on 10/15 she noticed the sudden onset of a sharp, "steady" pain similar to her prior episodes of nephrolithiasis.  This pain progressed and she developed chills, nausea, vomiting, dysuria, and confusion.  Her daughter called EMS when she noted confusion.  She was found to be hypoxemic by EMS and in shock by the ED.  The EDP also noted pyuria and L CVA tenderness.  A central line was placed, vanc/zosyn was given and PCCM was consulted for admission.  She notes some dyspnea but denies cough.  Past Medical History  Diagnosis Date  . Hypertension    History reviewed. No pertinent past surgical history. Prior to Admission medications   Not on File   Allergies No Known Allergies  Family History History reviewed. No pertinent family history. Social History  does not have a smoking history on file. She does not have any smokeless tobacco history on file. Her alcohol and drug histories not on file.  Review Of Systems:   Gen: notes fever and chills, denies weight change, fatigue, night sweats HEENT: Denies blurred vision, double vision, hearing loss, tinnitus, sinus congestion, rhinorrhea, sore throat, neck stiffness, dysphagia PULM: Notes some shortness of breath, but denies cough, sputum production, hemoptysis, wheezing CV: Denies chest pain, edema, orthopnea, paroxysmal nocturnal dyspnea, palpitations GI: Denies abdominal pain, nausea, vomiting, diarrhea, hematochezia, melena, constipation, change in bowel habits GU: notes some dysuria, denies hematuria, polyuria, oliguria, urethral discharge Endocrine: Denies hot or cold intolerance, polyuria, polyphagia or appetite  change Derm: Denies rash, dry skin, scaling or peeling skin change Heme: Denies easy bruising, bleeding, bleeding gums Neuro: Denies headache, numbness, weakness, slurred speech, loss of memory or consciousness   Brief patient description:  This is a 66 y/o female admitted on 10/16 to [facility] with septic shock likely from pyelonephritis.  DDx includes an obstructing kidney stone given her history of nephrolithiasis as well as (less likely) severe CAP given hypoxemia and crackles in left base.  Events Since Admission: 10/15 CT Ab/pelv (stone protocol) >>  Current Status: Septic shock  Vital Signs: Temp:  [99.3 F (37.4 C)-104.1 F (40.1 C)] 99.3 F (37.4 C) (10/16 0055) Pulse Rate:  [111-139] 114  (10/16 0055) Resp:  [17-42] 24  (10/16 0055) BP: (64-100)/(34-66) 78/47 mmHg (10/16 0055) SpO2:  [91 %-99 %] 95 % (10/16 0055)  Physical Examination: Gen: lying in bed, mild pain HEENT: NCAT, MM dry, PERRL, EOMi PULM: Insp crackles L base > R, no cough CV: Tachy, regular, distant heart sounds AB: BS+, soft, nontender, no masses, no guarding or rebound Ext: warm, trace edema Neuro: A&Ox3, follows commands, answers questions, maew  Principal Problem:  *Septic shock Active Problems:  Pyelonephritis  Hypoxemia  Acute encephalopathy  Nephrolithiasis   ASSESSMENT AND PLAN  PULMONARY  Lab 04/03/12 2309  PHART 7.281*  PCO2ART 41.0  PO2ART 21.0*  HCO3 19.3*  O2SAT 29.0   Ventilator Settings:   CXR:  Cardiomegally, bibasilar atelectasis ETT:    A:  Mild hypoxemia likely due to atelectasis from splinting and V/Q mismatch from sepsis P:   -judicious pain control and incentive spirometry -titrate O2 for sats > 90% -monitor  respiratory status closely -monitor for volume overload by following CVP  CARDIOVASCULAR  Lab 04/03/12 2222  TROPONINI --  LATICACIDVEN 5.0*  PROBNP --   ECG:  Sinus tachy, nonspecific ST wave changes in lateral precordial leads Lines: 10/15 R IJ  (EDP) >>  A: Septic shock P:  -see ID -EGDT -Sepsis protocol -monitor CVP closely and for signs of volume overload  RENAL  Lab 04/03/12 2221  NA 133*  K 3.5  CL 100  CO2 19  BUN 33*  CREATININE 1.90*  CALCIUM 8.1*  MG --  PHOS --   Intake/Output    None    Foley:  10/15 >>  A:  AKI (unclear baseline Cr) likely due to septic shock History of nephrolithiasis now with L pyelonephritis, concern for obstructing stone P:   -CT abdomen/pelvis stone protocol to evaluate for obstructing stone -urology consult if stone noted on left  GASTROINTESTINAL  Lab 04/03/12 2221  AST 104*  ALT 71*  ALKPHOS 169*  BILITOT 0.9  PROT 5.1*  ALBUMIN 2.5*    A:  Mildly elevated LFT's, exam not consistent with biliary process P:  -monitor LFT's (repeat in AM)  HEMATOLOGIC  Lab 04/03/12 2221  HGB 12.2  HCT 36.3  PLT 131*  INR --  APTT --   A:  No acute issues P:  -monitor for bleeding -monitor CBC  INFECTIOUS  Lab 04/03/12 2221  WBC 5.3  PROCALCITON --   Cultures: 10/15 Urine >> 10/15 Blood >> 10/15 Urine st/leg ag>>  Antibiotics: 10/15 zosyn x1  10/15 vanc (severe cap) >> 10/15 ceftriaxone (pyelo, severe CAP) >> 10/15 azithro (severe cap) >>  A:  Septic shock likely from pyelonephritis with or without an obstructing stone; Severe CAP also possible given crackles, dyspnea, sepsis; difficult to interpret CXR P:   -would d/c vanc and azithr quickly if no clear evidence of CAP and clinically improving -f/u cultures -see renal  ENDOCRINE  Lab 04/03/12 2202  GLUCAP 97   A:  No acute issues P:   -monitor glucose  NEUROLOGIC  A:  Acute encephalopathy from sepsis, improving with resuscitation and antibiotics P:   -frequent orientation -treat pain carefully with fentanyl  BEST PRACTICE / DISPOSITION Level of Care:  ICU Primary Service:  PCCM Consultants:   Code Status:  full Diet:  npo DVT Px:  scd GI Px:  n/a Skin Integrity:  normal Social /  Family:  Updated daughter at bedside  CC time 64 minutes  MCQUAID, Nathaneil Canary, M.D. Pulmonary and [place] Pager: [phone]  04/04/2012, 12:59 AM

## 2012-04-04 NOTE — Progress Notes (Signed)
  Echocardiogram 2D Echocardiogram has been performed.  Basilia Jumbo 04/04/2012, 2:27 PM

## 2012-04-04 NOTE — ED Notes (Signed)
MD Judithann Graves notified of patient vital signs and to bedside to re-evaluate patient, pt NS bolus placed in pressure bag.

## 2012-04-04 NOTE — Anesthesia Procedure Notes (Addendum)
Anesthesia Regional Block:   Narrative:    Arterial Line: Sterile CHG prep L wrist, #20 ga AC L radial artery 2nd attempt, good waveform.  Sterile dressing applied.   Jenita Seashore, MD

## 2012-04-04 NOTE — Progress Notes (Signed)
Name: [name] MRN: [id number] DOB: Oct 15, 1945    LOS: 1  Referring Provider:  Zenia Resides EDP Reason for Referral:  Septic shock  PULMONARY / CRITICAL CARE MEDICINE  Brief patient description:  This is a 67 y/o female admitted on 10/16 to [facility] with septic shock likely from pyelonephritis.  DDx includes an obstructing kidney stone given her history of nephrolithiasis as well as (less likely) severe CAP given hypoxemia and crackles in left base.  Events Since Admission: 10/15 CT Ab/pelv (stone protocol) >> 10/15: ETT>>> 10/16: Cystoscopy, Left Retrograde Pyelogram, with interpretation, and Left JJ stent ( 12F x 24 cm).  Current Status: Septic shock  Vital Signs: Temp:  [98.8 F (37.1 C)-104.1 F (40.1 C)] 98.8 F (37.1 C) (10/16 0120) Pulse Rate:  [111-151] 151  (10/16 0400) Resp:  [17-42] 24  (10/16 0634) BP: (64-205)/(34-106) 136/106 mmHg (10/16 0634) SpO2:  [91 %-99 %] 94 % (10/16 0438) FiO2 (%):  [36 %-60 %] 60 % (10/16 0634) Weight:  [236 lb 12.4 oz (107.4 kg)] 236 lb 12.4 oz (107.4 kg) (10/16 0120)  Physical Examination: Gen: intubated HEENT: NCAT, MM dry, PERRL, EOMi PULM: coarse breath sounds CV: Tachy, regular, distant heart sounds AB: BS+, soft, nontender, no masses, no guarding or rebound Ext: warm, trace edema Neuro: follows commands, opens eyes spontaneously Principal Problem:  *Septic shock Active Problems:  Pyelonephritis  Hypoxemia  Acute encephalopathy  Nephrolithiasis  AKI (acute kidney injury)   ASSESSMENT AND PLAN  PULMONARY  Lab 04/04/12 0323 04/04/12 0255 04/03/12 2309  PHART 7.329* -- 7.281*  PCO2ART 30.2* -- 41.0  PO2ART 69.1* -- 21.0*  HCO3 15.5* -- 19.3*  O2SAT 93.2 70.3 29.0   Ventilator Settings: Vent Mode:  [-] PRVC FiO2 (%):  [36 %-60 %] 60 % Set Rate:  [20 bmp-24 bmp] 24 bmp Vt Set:  [350 mL] 350 mL PEEP:  [5 cmH20] 5 cmH20 Plateau Pressure:  [18 cmH20-21 cmH20] 21 cmH20 CXR:  Cardiomegally, bibasilar atelectasis ETT:   10/15  A:  ARDS protocol: intubated overnight due to marked respiratory distress, cyanosis and RR:40.  On PRVC: 60% FiO2, PEEP: 10 R/o ARDS vs CAP (less likely) P:   - pcxr reviewed -abg reviewed, increase MV, rate 30, consider repeat abg, if ph less 7.25 start bicarb -pcxr in am -may need liberalization small amount of tv -keep plat less 30 -peep escalated, goal to 40%  CARDIOVASCULAR  Lab 04/04/12 0428 04/04/12 0210 04/03/12 2222  TROPONINI 0.44* <0.30 --  LATICACIDVEN -- -- 5.0*  PROBNP -- 8289.0* --   ECG:  Sinus tachy, nonspecific ST wave changes in lateral precordial leads Lines: 10/15 R IJ (EDP) >>  A: Septic shock: s/p 4x 1L NS bolus. On levophed and dobutamine.  P:  -see ID -EGDT, cvp at goal -Sepsis protocol, levo at 30, vasopressin -cortisol 33, no need steroids -svo2 awaited  RENAL  Lab 04/04/12 0428 04/04/12 0210 04/03/12 2221  NA 132* -- 133*  K 3.9 -- 3.5  CL 103 -- 100  CO2 15* -- 19  BUN 34* -- 33*  CREATININE 2.00* -- 1.90*  CALCIUM 7.2* -- 8.1*  MG 1.2* 1.0* --  PHOS 4.2 2.2* --   Intake/Output      10/15 0701 - 10/16 0700 10/16 0701 - 10/17 0700   I.V. (mL/kg) 80 (0.7)    IV Piggyback 300    Total Intake(mL/kg) 380 (3.5)    Urine (mL/kg/hr) 150 (0.1)    Total Output 150    Net +  230          Foley:  10/15 >>  A:  AKI (unclear baseline Cr) likely due to septic shock History of nephrolithiasis now with L pyelonephritis: CT: impacted 75mm left upper ureteral stone at [facility].  - left JJ stent placed.  - metabolic acidosis with AG. Likely from lactic acidosis.  P:   - monitor creatinine now that stent is placed. BMP in pm - replace magnesium -at risk obstruction stents, in setting continued shock, output drop, consider renal US and perc nephrostomy if present Will d/w urology -bmet q8h -repeat lactic , clearing?  GASTROINTESTINAL  Lab 04/04/12 0428 04/03/12 2221  AST 127* 104*  ALT 80* 71*  ALKPHOS 209* 169*  BILITOT 0.9 0.9  PROT  5.2* 5.1*  ALBUMIN 2.4* 2.5*    A:  Mildly elevated LFT's, likely from shock liver P:  -monitor LFT's  - start tube feeds  HEMATOLOGIC  Lab 04/04/12 0428 04/03/12 2221  HGB 12.3 12.2  HCT 37.5 36.3  PLT 152 131*  INR -- --  APTT -- --   A:  Bleeding from urine P:  -monitor for bleeding -monitor CBC -scd Hold sub qhep  INFECTIOUS  Lab 04/04/12 0428 04/04/12 0210 04/03/12 2221  WBC 5.1 -- 5.3  PROCALCITON -- 82.91 --   Cultures: 10/15 Urine >> 10/15 Blood >> 10/15 Urine st/leg ag>>  Antibiotics: 10/15 zosyn x1  10/15 vanc (severe cap) >> 10/15 ceftriaxone (pyelo, severe CAP) >> 10/15 azithro (severe cap) >>  Lactic acid: 5 A:  Septic shock likely from pyelonephritis with obstructive uropathy; r/o Severe CAP also possible given crackles, dyspnea, sepsis; difficult to interpret CXR Missing organism? Continue shcok P:   - continue vanc -dc azithro -dc ceftriaxone, ceftaz Add levofloxa -see renal, perc nephrostomy  ENDOCRINE  Lab 04/04/12 0358 04/04/12 0225 04/03/12 2202  GLUCAP 125* 107* 97   A:  No acute issues P:   -monitor glucose  NEUROLOGIC  A:  Acute encephalopathy from sepsis, improving with resuscitation and antibiotics P:   -frequent orientation -treat pain carefully with fentanyl  BEST PRACTICE / DISPOSITION Level of Care:  ICU Primary Service:  PCCM Consultants:  Urology Code Status:  full Diet:  npo DVT Px:  scd GI Px:  n/a Skin Integrity:  normal Social / Family:  Updated daughter at bedside  CC time 35 minutes  Liam Graham, M.D. [facility] [place] Pager: [phone]  04/04/2012, 7:40 AM  I have fully examined this patient and agree with above findings.    And edited infull  Lavon Paganini. Titus Mould, MD, [place] Pgr: [facility]

## 2012-04-04 NOTE — Consult Note (Signed)
Urology Consult  Referring physician: Chase Caller Reason for referral: kidney stones, anuria  Chief Complaint: anuria, septic shock  History of Present Illness: HPI: This is a 66 y/o female with hypertension and nephrolithiasis who was admitted in the early AM of 10/16 to [facility] through the ED for septic shock. At 0100 on 10/15 she noticed the sudden onset of a sharp, "steady" pain similar to her prior episodes of nephrolithiasis. This pain progressed and she developed chills, nausea, vomiting, dysuria, and confusion. Her daughter called EMS when she noted confusion. She was found to be hypoxemic by EMS and in shock by the ED. The EDP also noted pyuria and L CVA tenderness. A central line was placed, vanc/zosyn was given and PCCM was consulted for admission. She notes some dyspnea but denies cough.    Past Medical History  Diagnosis Date  . Hypertension   . Pneumonia   . GERD (gastroesophageal reflux disease)   . Headache    Past Surgical History  Procedure Date  . Abdominal hysterectomy   . Cholecystectomy     Medications: I have reviewed the patient's current medications. Allergies: No Known Allergies  History reviewed. No pertinent family history. Social History:  reports that she has never smoked. She does not have any smokeless tobacco history on file. She reports that she does not drink alcohol or use illicit drugs.  ROS: Review Of Systems:  Gen: notes fever and chills, denies weight change, fatigue, night sweats  HEENT: Denies blurred vision, double vision, hearing loss, tinnitus, sinus congestion, rhinorrhea, sore throat, neck stiffness, dysphagia  PULM: Notes some shortness of breath, but denies cough, sputum production, hemoptysis, wheezing  CV: Denies chest pain, edema, orthopnea, paroxysmal nocturnal dyspnea, palpitations  GI: Denies abdominal pain, nausea, vomiting, diarrhea, hematochezia, melena, constipation, change in bowel habits  GU: notes some dysuria, denies  hematuria, polyuria, oliguria, urethral discharge  Endocrine: Denies hot or cold intolerance, polyuria, polyphagia or appetite change  Derm: Denies rash, dry skin, scaling or peeling skin change  Heme: Denies easy bruising, bleeding, bleeding gums  Neuro: Denies headache, numbness, weakness, slurred speech, loss of memory or consciousness    Physical Exam:  Vital signs in last 24 hours: Temp:  [98.8 F (37.1 C)-104.1 F (40.1 C)] 98.8 F (37.1 C) (10/16 0120) Pulse Rate:  [111-151] 151  (10/16 0400) Resp:  [17-42] 20  (10/16 0400) BP: (64-205)/(34-101) 178/82 mmHg (10/16 0438) SpO2:  [91 %-99 %] 94 % (10/16 0438) FiO2 (%):  [36 %-60 %] 60 % (10/16 0438) Weight:  [107.4 kg (236 lb 12.4 oz)] 107.4 kg (236 lb 12.4 oz) (10/16 0120)  Cardiovascular: Skin warm; not flushed Respiratory: Breaths quiet; no shortness of breath Abdomen: No masses Neurological: Normal sensation to touch Musculoskeletal: Normal motor function arms and legs Lymphatics: No inguinal adenopathy Skin: No rashes Genitourinary: Normal BUS. Foley in place  Laboratory Data:  Results for orders placed during the hospital encounter of 04/03/12 (from the past 72 hour(s))  GLUCOSE, CAPILLARY     Status: Normal   Collection Time   04/03/12 10:02 PM      Component Value Range Comment   Glucose-Capillary 97  70 - 99 mg/dL   URINALYSIS, ROUTINE W REFLEX MICROSCOPIC     Status: Abnormal   Collection Time   04/03/12 10:19 PM      Component Value Range Comment   Color, Urine ORANGE (*) YELLOW BIOCHEMICALS MAY BE AFFECTED BY COLOR   APPearance TURBID (*) CLEAR    Specific Gravity, Urine  1.024  1.005 - 1.030    pH 5.0  5.0 - 8.0    Glucose, UA NEGATIVE  NEGATIVE mg/dL    Hgb urine dipstick LARGE (*) NEGATIVE    Bilirubin Urine SMALL (*) NEGATIVE    Ketones, ur 15 (*) NEGATIVE mg/dL    Protein, ur 30 (*) NEGATIVE mg/dL    Urobilinogen, UA 1.0  0.0 - 1.0 mg/dL    Nitrite POSITIVE (*) NEGATIVE    Leukocytes, UA LARGE  (*) NEGATIVE   URINE MICROSCOPIC-ADD ON     Status: Abnormal   Collection Time   04/03/12 10:19 PM      Component Value Range Comment   Squamous Epithelial / LPF FEW (*) RARE    WBC, UA TOO NUMEROUS TO COUNT  <3 WBC/hpf    RBC / HPF 21-50  <3 RBC/hpf    Bacteria, UA MANY (*) RARE   CBC WITH DIFFERENTIAL     Status: Abnormal   Collection Time   04/03/12 10:21 PM      Component Value Range Comment   WBC 5.3  4.0 - 10.5 K/uL    RBC 4.17  3.87 - 5.11 MIL/uL    Hemoglobin 12.2  12.0 - 15.0 g/dL    HCT 36.3  36.0 - 46.0 %    MCV 87.1  78.0 - 100.0 fL    MCH 29.3  26.0 - 34.0 pg    MCHC 33.6  30.0 - 36.0 g/dL    RDW 14.8  11.5 - 15.5 %    Platelets 131 (*) 150 - 400 K/uL    Neutrophils Relative 95 (*) 43 - 77 %    Lymphocytes Relative 3 (*) 12 - 46 %    Monocytes Relative 1 (*) 3 - 12 %    Eosinophils Relative 1  0 - 5 %    Basophils Relative 0  0 - 1 %    Neutro Abs 4.9  1.7 - 7.7 K/uL    Lymphs Abs 0.2 (*) 0.7 - 4.0 K/uL    Monocytes Absolute 0.1  0.1 - 1.0 K/uL    Eosinophils Absolute 0.1  0.0 - 0.7 K/uL    Basophils Absolute 0.0  0.0 - 0.1 K/uL    WBC Morphology INCREASED BANDS (>20% BANDS)   MILD LEFT SHIFT (1-5% METAS, OCC MYELO, OCC BANDS)  COMPREHENSIVE METABOLIC PANEL     Status: Abnormal   Collection Time   04/03/12 10:21 PM      Component Value Range Comment   Sodium 133 (*) 135 - 145 mEq/L    Potassium 3.5  3.5 - 5.1 mEq/L    Chloride 100  96 - 112 mEq/L    CO2 19  19 - 32 mEq/L    Glucose, Bld 93  70 - 99 mg/dL    BUN 33 (*) 6 - 23 mg/dL    Creatinine, Ser 1.90 (*) 0.50 - 1.10 mg/dL    Calcium 8.1 (*) 8.4 - 10.5 mg/dL    Total Protein 5.1 (*) 6.0 - 8.3 g/dL    Albumin 2.5 (*) 3.5 - 5.2 g/dL    AST 104 (*) 0 - 37 U/L    ALT 71 (*) 0 - 35 U/L    Alkaline Phosphatase 169 (*) 39 - 117 U/L    Total Bilirubin 0.9  0.3 - 1.2 mg/dL    GFR calc non Af Amer 26 (*) >90 mL/min    GFR calc Af Amer 30 (*) >90 mL/min   LACTIC ACID,  PLASMA     Status: Abnormal   Collection  Time   04/03/12 10:22 PM      Component Value Range Comment   Lactic Acid, Venous 5.0 (*) 0.5 - 2.2 mmol/L   POCT I-STAT TROPONIN I     Status: Normal   Collection Time   04/03/12 10:38 PM      Component Value Range Comment   Troponin i, poc 0.06  0.00 - 0.08 ng/mL    Comment 3            POCT I-STAT 3, BLOOD GAS (G3+)     Status: Abnormal   Collection Time   04/03/12 11:09 PM      Component Value Range Comment   pH, Arterial 7.281 (*) 7.350 - 7.450    pCO2 arterial 41.0  35.0 - 45.0 mmHg    pO2, Arterial 21.0 (*) 80.0 - 100.0 mmHg    Bicarbonate 19.3 (*) 20.0 - 24.0 mEq/L    TCO2 21  0 - 100 mmol/L    O2 Saturation 29.0      Acid-base deficit 7.0 (*) 0.0 - 2.0 mmol/L    Patient temperature 98.6 F      Collection site RADIAL, ALLEN'S TEST ACCEPTABLE      Drawn by Operator      Sample type ARTERIAL      Comment NOTIFIED PHYSICIAN     MRSA PCR SCREENING     Status: Normal   Collection Time   04/04/12  2:05 AM      Component Value Range Comment   MRSA by PCR NEGATIVE  NEGATIVE   PRO B NATRIURETIC PEPTIDE     Status: Abnormal   Collection Time   04/04/12  2:10 AM      Component Value Range Comment   Pro B Natriuretic peptide (BNP) 8289.0 (*) 0 - 125 pg/mL   TROPONIN I     Status: Normal   Collection Time   04/04/12  2:10 AM      Component Value Range Comment   Troponin I <0.30  <0.30 ng/mL   AMYLASE     Status: Normal   Collection Time   04/04/12  2:10 AM      Component Value Range Comment   Amylase 19  0 - 105 U/L   LIPASE, BLOOD     Status: Abnormal   Collection Time   04/04/12  2:10 AM      Component Value Range Comment   Lipase 9 (*) 11 - 59 U/L   MAGNESIUM     Status: Abnormal   Collection Time   04/04/12  2:10 AM      Component Value Range Comment   Magnesium 1.0 (*) 1.5 - 2.5 mg/dL   PHOSPHORUS     Status: Abnormal   Collection Time   04/04/12  2:10 AM      Component Value Range Comment   Phosphorus 2.2 (*) 2.3 - 4.6 mg/dL   PROCALCITONIN     Status:  Normal   Collection Time   04/04/12  2:10 AM      Component Value Range Comment   Procalcitonin 82.91     FIBRINOGEN     Status: Normal   Collection Time   04/04/12  2:10 AM      Component Value Range Comment   Fibrinogen 389  204 - 475 mg/dL   GLUCOSE, CAPILLARY     Status: Abnormal   Collection Time   04/04/12  2:25 AM  Component Value Range Comment   Glucose-Capillary 107 (*) 70 - 99 mg/dL    Comment 1 Documented in Chart      Comment 2 Notify RN     CARBOXYHEMOGLOBIN     Status: Normal   Collection Time   04/04/12  2:55 AM      Component Value Range Comment   Total hemoglobin 12.3  12.0 - 16.0 g/dL    O2 Saturation 70.3      Carboxyhemoglobin 1.0  0.5 - 1.5 %    Methemoglobin 1.5  0.0 - 1.5 %   BLOOD GAS, ARTERIAL     Status: Abnormal   Collection Time   04/04/12  3:23 AM      Component Value Range Comment   FIO2 0.44      Delivery systems NASAL CANNULA      pH, Arterial 7.329 (*) 7.350 - 7.450    pCO2 arterial 30.2 (*) 35.0 - 45.0 mmHg    pO2, Arterial 69.1 (*) 80.0 - 100.0 mmHg    Bicarbonate 15.5 (*) 20.0 - 24.0 mEq/L    TCO2 16.4  0 - 100 mmol/L    Acid-base deficit 9.3 (*) 0.0 - 2.0 mmol/L    O2 Saturation 93.2      Patient temperature 98.6      Collection site RIGHT RADIAL      Drawn by [phone]      Sample type ARTERIAL      Allens test (pass/fail) PASS  PASS   GLUCOSE, CAPILLARY     Status: Abnormal   Collection Time   04/04/12  3:58 AM      Component Value Range Comment   Glucose-Capillary 125 (*) 70 - 99 mg/dL    Comment 1 Documented in Chart      Comment 2 Notify RN     CBC     Status: Normal   Collection Time   04/04/12  4:28 AM      Component Value Range Comment   WBC 5.1  4.0 - 10.5 K/uL    RBC 4.25  3.87 - 5.11 MIL/uL    Hemoglobin 12.3  12.0 - 15.0 g/dL    HCT 37.5  36.0 - 46.0 %    MCV 88.2  78.0 - 100.0 fL    MCH 28.9  26.0 - 34.0 pg    MCHC 32.8  30.0 - 36.0 g/dL    RDW 15.2  11.5 - 15.5 %    Platelets 152  150 - 400 K/uL     Recent Results (from the past 240 hour(s))  MRSA PCR SCREENING     Status: Normal   Collection Time   04/04/12  2:05 AM      Component Value Range Status Comment   MRSA by PCR NEGATIVE  NEGATIVE Final    Creatinine:  Basename 04/03/12 2221  CREATININE 1.90*    Xrays: Clinical Data: Fever. Urinary tract infection. Sepsis. History  of nephrolithiasis.  CT ABDOMEN AND PELVIS WITHOUT CONTRAST  Technique: Multidetector CT imaging of the abdomen and pelvis was  performed following the standard protocol without intravenous  contrast.  Comparison: 02/25/2005  Findings: Atelectasis or infiltration in both lung bases. Large  esophageal hiatal hernia.  Multiple intrarenal stones bilaterally, with four stones in the  right kidney and two stones in the left kidney. In the left  ureteropelvic junction, there is an additional 7 mm stone. There  is a moderate proximal pyelocaliectasis with periureteral and  pararenal stranding suggesting moderate obstruction.  The distal  left ureter is decompressed. No right ureteral stones are  demonstrated. Infection is not excluded but no abscesses are  demonstrated.  Surgical absence of the gallbladder. The unenhanced appearance of  the liver, spleen, pancreas, adrenal glands, abdominal aorta, and  retroperitoneal lymph nodes is unremarkable. Small periumbilical  hernia containing fat. The stomach, small bowel, and colon are  decompressed without abnormal distension. Stool filled colon. No  free air or free fluid in the abdomen.  Pelvis: Diverticula in the sigmoid colon without diverticulitis.  The appendix is normal. The bladder is decompressed with Foley  catheter. No significant pelvic lymphadenopathy. Degenerative  changes in the lumbar spine.  IMPRESSION:  7 mm obstructing stone in the proximal left ureter at the  ureteropelvic junction. Pararenal and periureteral stranding.  Bilateral nonobstructing intrarenal stones. Large esophageal   hiatal hernia with much of the stomach above the diaphragm.  Atelectasis or infiltration in both lung bases.  Original Report Authenticated By: Neale Burly, M.D.   Impression/Assessment:  Uroseptic shock, now with anuria. Critical care asks for JJ stent to try to open L ureter, and drain infected proximal urine, in hopes of recovery of renal function.   Plan:  To OR for JJ stent ( Left). Discussed with daughter and Dr. Ponciano Ort, Greg Cratty I 04/04/2012, 4:46 AM

## 2012-04-05 ENCOUNTER — Inpatient Hospital Stay (HOSPITAL_COMMUNITY): Payer: Medicare Other

## 2012-04-05 DIAGNOSIS — N12 Tubulo-interstitial nephritis, not specified as acute or chronic: Secondary | ICD-10-CM

## 2012-04-05 DIAGNOSIS — N179 Acute kidney failure, unspecified: Secondary | ICD-10-CM

## 2012-04-05 DIAGNOSIS — R6520 Severe sepsis without septic shock: Secondary | ICD-10-CM

## 2012-04-05 DIAGNOSIS — R6521 Severe sepsis with septic shock: Secondary | ICD-10-CM

## 2012-04-05 DIAGNOSIS — G9340 Encephalopathy, unspecified: Secondary | ICD-10-CM

## 2012-04-05 DIAGNOSIS — R0902 Hypoxemia: Secondary | ICD-10-CM

## 2012-04-05 DIAGNOSIS — A419 Sepsis, unspecified organism: Secondary | ICD-10-CM

## 2012-04-05 DIAGNOSIS — N200 Calculus of kidney: Secondary | ICD-10-CM

## 2012-04-05 LAB — BASIC METABOLIC PANEL
BUN: 43 mg/dL — ABNORMAL HIGH (ref 6–23)
BUN: 44 mg/dL — ABNORMAL HIGH (ref 6–23)
BUN: 44 mg/dL — ABNORMAL HIGH (ref 6–23)
BUN: 52 mg/dL — ABNORMAL HIGH (ref 6–23)
CO2: 15 mEq/L — ABNORMAL LOW (ref 19–32)
CO2: 16 mEq/L — ABNORMAL LOW (ref 19–32)
CO2: 18 mEq/L — ABNORMAL LOW (ref 19–32)
CO2: 15 mEq/L — ABNORMAL LOW (ref 19–32)
Calcium: 7 mg/dL — ABNORMAL LOW (ref 8.4–10.5)
Calcium: 6.9 mg/dL — ABNORMAL LOW (ref 8.4–10.5)
Calcium: 7.3 mg/dL — ABNORMAL LOW (ref 8.4–10.5)
Calcium: 7.7 mg/dL — ABNORMAL LOW (ref 8.4–10.5)
Chloride: 101 mEq/L (ref 96–112)
Chloride: 103 mEq/L (ref 96–112)
Chloride: 101 mEq/L (ref 96–112)
Chloride: 103 mEq/L (ref 96–112)
Creatinine, Ser: 2.55 mg/dL — ABNORMAL HIGH (ref 0.50–1.10)
Creatinine, Ser: 2.6 mg/dL — ABNORMAL HIGH (ref 0.50–1.10)
Creatinine, Ser: 2.74 mg/dL — ABNORMAL HIGH (ref 0.50–1.10)
Creatinine, Ser: 2.73 mg/dL — ABNORMAL HIGH (ref 0.50–1.10)
GFR calc Af Amer: 21 mL/min — ABNORMAL LOW (ref >90)
GFR calc Af Amer: 21 mL/min — ABNORMAL LOW (ref >90)
GFR calc Af Amer: 20 mL/min — ABNORMAL LOW (ref >90)
GFR calc Af Amer: 20 mL/min — ABNORMAL LOW (ref >90)
GFR calc non Af Amer: 18 mL/min — ABNORMAL LOW (ref >90)
GFR calc non Af Amer: 18 mL/min — ABNORMAL LOW (ref >90)
GFR calc non Af Amer: 17 mL/min — ABNORMAL LOW (ref >90)
GFR calc non Af Amer: 17 mL/min — ABNORMAL LOW (ref >90)
Glucose, Bld: 177 mg/dL — ABNORMAL HIGH (ref 70–99)
Glucose, Bld: 155 mg/dL — ABNORMAL HIGH (ref 70–99)
Glucose, Bld: 177 mg/dL — ABNORMAL HIGH (ref 70–99)
Glucose, Bld: 176 mg/dL — ABNORMAL HIGH (ref 70–99)
Potassium: 3.6 mEq/L (ref 3.5–5.1)
Potassium: 3.4 mEq/L — ABNORMAL LOW (ref 3.5–5.1)
Potassium: 3.6 mEq/L (ref 3.5–5.1)
Potassium: 3.5 mEq/L (ref 3.5–5.1)
Sodium: 132 mEq/L — ABNORMAL LOW (ref 135–145)
Sodium: 134 mEq/L — ABNORMAL LOW (ref 135–145)
Sodium: 132 mEq/L — ABNORMAL LOW (ref 135–145)
Sodium: 132 mEq/L — ABNORMAL LOW (ref 135–145)

## 2012-04-05 LAB — CBC WITH DIFFERENTIAL/PLATELET
Basophils Absolute: 0 10*3/uL (ref 0.0–0.1)
Basophils Relative: 0 % (ref 0–1)
Eosinophils Absolute: 0 10*3/uL (ref 0.0–0.7)
Eosinophils Relative: 0 % (ref 0–5)
HCT: 33.5 % — ABNORMAL LOW (ref 36.0–46.0)
Hemoglobin: 11.4 g/dL — ABNORMAL LOW (ref 12.0–15.0)
Lymphocytes Relative: 3 % — ABNORMAL LOW (ref 12–46)
Lymphs Abs: 0.7 10*3/uL (ref 0.7–4.0)
MCH: 28.9 pg (ref 26.0–34.0)
MCHC: 34 g/dL (ref 30.0–36.0)
MCV: 85 fL (ref 78.0–100.0)
Monocytes Absolute: 0.5 10*3/uL (ref 0.1–1.0)
Monocytes Relative: 2 % — ABNORMAL LOW (ref 3–12)
Neutro Abs: 23.5 10*3/uL — ABNORMAL HIGH (ref 1.7–7.7)
Neutrophils Relative %: 95 % — ABNORMAL HIGH (ref 43–77)
Platelets: 72 10*3/uL — ABNORMAL LOW (ref 150–400)
RBC: 3.94 MIL/uL (ref 3.87–5.11)
RDW: 15.3 % (ref 11.5–15.5)
WBC Morphology: INCREASED
WBC: 24.7 10*3/uL — ABNORMAL HIGH (ref 4.0–10.5)

## 2012-04-05 LAB — POCT I-STAT 3, ART BLOOD GAS (G3+)
Acid-base deficit: 8 mmol/L — ABNORMAL HIGH (ref 0.0–2.0)
Bicarbonate: 16.1 mEq/L — ABNORMAL LOW (ref 20.0–24.0)
O2 Saturation: 99 %
Patient temperature: 98.6
TCO2: 17 mmol/L (ref 0–100)
pCO2 arterial: 29.5 mmHg — ABNORMAL LOW (ref 35.0–45.0)
pH, Arterial: 7.345 — ABNORMAL LOW (ref 7.350–7.450)
pO2, Arterial: 149 mmHg — ABNORMAL HIGH (ref 80.0–100.0)

## 2012-04-05 LAB — BLOOD GAS, ARTERIAL
Acid-base deficit: 8.3 mmol/L — ABNORMAL HIGH (ref 0.0–2.0)
Bicarbonate: 15.7 mEq/L — ABNORMAL LOW (ref 20.0–24.0)
Drawn by: 330991
FIO2: 0.4 %
MECHVT: 470 mL
O2 Saturation: 99.1 %
PEEP: 5 cmH2O
Patient temperature: 98.6
RATE: 30 resp/min
TCO2: 16.5 mmol/L (ref 0–100)
pCO2 arterial: 26.6 mmHg — ABNORMAL LOW (ref 35.0–45.0)
pH, Arterial: 7.388 (ref 7.350–7.450)
pO2, Arterial: 121 mmHg — ABNORMAL HIGH (ref 80.0–100.0)

## 2012-04-05 LAB — GLUCOSE, CAPILLARY
Glucose-Capillary: 131 mg/dL — ABNORMAL HIGH (ref 70–99)
Glucose-Capillary: 142 mg/dL — ABNORMAL HIGH (ref 70–99)
Glucose-Capillary: 144 mg/dL — ABNORMAL HIGH (ref 70–99)
Glucose-Capillary: 154 mg/dL — ABNORMAL HIGH (ref 70–99)

## 2012-04-05 LAB — COMPREHENSIVE METABOLIC PANEL
ALT: 247 U/L — ABNORMAL HIGH (ref 0–35)
AST: 231 U/L — ABNORMAL HIGH (ref 0–37)
Albumin: 1.9 g/dL — ABNORMAL LOW (ref 3.5–5.2)
Alkaline Phosphatase: 143 U/L — ABNORMAL HIGH (ref 39–117)
BUN: 43 mg/dL — ABNORMAL HIGH (ref 6–23)
CO2: 16 mEq/L — ABNORMAL LOW (ref 19–32)
Calcium: 6.9 mg/dL — ABNORMAL LOW (ref 8.4–10.5)
Chloride: 103 mEq/L (ref 96–112)
Creatinine, Ser: 2.62 mg/dL — ABNORMAL HIGH (ref 0.50–1.10)
GFR calc Af Amer: 21 mL/min — ABNORMAL LOW (ref >90)
GFR calc non Af Amer: 18 mL/min — ABNORMAL LOW (ref >90)
Glucose, Bld: 159 mg/dL — ABNORMAL HIGH (ref 70–99)
Potassium: 3.5 mEq/L (ref 3.5–5.1)
Sodium: 134 mEq/L — ABNORMAL LOW (ref 135–145)
Total Bilirubin: 0.7 mg/dL (ref 0.3–1.2)
Total Protein: 4.6 g/dL — ABNORMAL LOW (ref 6.0–8.3)

## 2012-04-05 LAB — CARBOXYHEMOGLOBIN
Carboxyhemoglobin: 0.7 % (ref 0.5–1.5)
Methemoglobin: 1.5 % (ref 0.0–1.5)
O2 Saturation: 78.3 %
Total hemoglobin: 12.4 g/dL (ref 12.0–16.0)

## 2012-04-05 LAB — TECHNOLOGIST SMEAR REVIEW

## 2012-04-05 LAB — URINE CULTURE: Colony Count: >=100000

## 2012-04-05 LAB — MAGNESIUM: Magnesium: 2.1 mg/dL (ref 1.5–2.5)

## 2012-04-05 LAB — CBC
HCT: 33.5 % — ABNORMAL LOW (ref 36.0–46.0)
Hemoglobin: 11.5 g/dL — ABNORMAL LOW (ref 12.0–15.0)
MCH: 28.9 pg (ref 26.0–34.0)
MCHC: 34.3 g/dL (ref 30.0–36.0)
MCV: 84.2 fL (ref 78.0–100.0)
Platelets: 71 10*3/uL — ABNORMAL LOW (ref 150–400)
RBC: 3.98 MIL/uL (ref 3.87–5.11)
RDW: 15.3 % (ref 11.5–15.5)
WBC: 26 10*3/uL — ABNORMAL HIGH (ref 4.0–10.5)

## 2012-04-05 LAB — APTT: aPTT: 47 seconds — ABNORMAL HIGH (ref 24–37)

## 2012-04-05 LAB — PROTIME-INR
INR: 1.48 (ref 0.00–1.49)
Prothrombin Time: 17.5 seconds — ABNORMAL HIGH (ref 11.6–15.2)

## 2012-04-05 LAB — LACTIC ACID, PLASMA: Lactic Acid, Venous: 3.1 mmol/L — ABNORMAL HIGH (ref 0.5–2.2)

## 2012-04-05 LAB — TROPONIN I: Troponin I: 1.49 ng/mL (ref <0.30)

## 2012-04-05 LAB — TSH: TSH: 0.475 u[IU]/mL (ref 0.350–4.500)

## 2012-04-05 LAB — D-DIMER, QUANTITATIVE: D-Dimer, Quant: 13.75 ug/mL-FEU — ABNORMAL HIGH (ref 0.00–0.48)

## 2012-04-05 MED ORDER — FAMOTIDINE IN NACL 20-0.9 MG/50ML-% IV SOLN
20.0000 mg | INTRAVENOUS | Status: DC
  Administered 2012-04-05 – 2012-04-07 (×3): 20 mg via INTRAVENOUS
  Filled 2012-04-05 (×4): qty 50

## 2012-04-05 MED ORDER — AMIODARONE HCL IN DEXTROSE 360-4.14 MG/200ML-% IV SOLN
INTRAVENOUS | Status: AC
  Filled 2012-04-05: qty 200

## 2012-04-05 MED ORDER — OXEPA PO LIQD
1000.0000 mL | ORAL | Status: DC
  Administered 2012-04-05 – 2012-04-10 (×6): 1000 mL
  Filled 2012-04-05 (×11): qty 1000

## 2012-04-05 MED ORDER — MIDAZOLAM HCL 2 MG/2ML IJ SOLN: 1.0000 mg | INTRAMUSCULAR | Status: DC | PRN

## 2012-04-05 MED ORDER — ADULT MULTIVITAMIN LIQUID CH
5.0000 mL | Freq: Every day | ORAL | Status: DC
  Administered 2012-04-05 – 2012-04-15 (×10): 5 mL
  Filled 2012-04-05 (×11): qty 5

## 2012-04-05 MED ORDER — AMIODARONE HCL IN DEXTROSE 360-4.14 MG/200ML-% IV SOLN
0.5000 mg/min | INTRAVENOUS | Status: DC
  Administered 2012-04-05 – 2012-04-08 (×8): 0.5 mg/min via INTRAVENOUS
  Filled 2012-04-05 (×18): qty 200

## 2012-04-05 MED ORDER — AMIODARONE LOAD VIA INFUSION
150.0000 mg | Freq: Once | INTRAVENOUS | Status: AC
  Administered 2012-04-05: 150 mg via INTRAVENOUS
  Filled 2012-04-05: qty 83.34

## 2012-04-05 MED ORDER — AMIODARONE HCL IN DEXTROSE 360-4.14 MG/200ML-% IV SOLN
1.0000 mg/min | INTRAVENOUS | Status: AC
  Administered 2012-04-05 (×2): 1 mg/min via INTRAVENOUS
  Filled 2012-04-05: qty 200

## 2012-04-05 MED ORDER — PRO-STAT SUGAR FREE PO LIQD
60.0000 mL | Freq: Three times a day (TID) | ORAL | Status: DC
  Administered 2012-04-05 – 2012-04-10 (×18): 60 mL
  Filled 2012-04-05 (×22): qty 60

## 2012-04-05 NOTE — Progress Notes (Signed)
Name: [name] MRN: [id number] DOB: 11-Jan-1946    LOS: 2  Referring Provider:  Zenia Resides EDP Reason for Referral:  Septic shock  PULMONARY / CRITICAL CARE MEDICINE  Brief patient description:  This is a 65 y/o female admitted on 10/16 to [facility] with septic shock likely from pyelonephritis.  DDx includes an obstructing kidney stone given her history of nephrolithiasis as well as (less likely) severe CAP given hypoxemia and crackles in left base.  Events Since Admission: 10/15 CT Ab/pelv (stone protocol) >> 10/15: ETT>>> 10/16: Cystoscopy, Left Retrograde Pyelogram, with interpretation, and Left JJ stent ( 41F x 24 cm). 10/7- refractory shock, acidosis  Current Status: Septic shock profound Renal US reviewed and since no evidence of hydronephrosis, perc drain was held.   Vital Signs: Temp:  [97.9 F (36.6 C)-98.7 F (37.1 C)] 98.2 F (36.8 C) (10/17 0412) Pulse Rate:  [104-127] 108  (10/17 0600) Resp:  [18-35] 30  (10/17 0600) BP: (77-115)/(26-71) 95/50 mmHg (10/17 0600) SpO2:  [91 %-100 %] 100 % (10/17 0600) Arterial Line BP: (72-138)/(43-60) 102/47 mmHg (10/17 0600) FiO2 (%):  [40 %-60 %] 40 % (10/17 0557) Weight:  [249 lb 9 oz (113.2 kg)] 249 lb 9 oz (113.2 kg) (10/17 0458)  Physical Examination: Gen: intubated HEENT: NCAT, MM dry, PERRL, EOMi PULM: coarse breath sounds CV: Tachy, regular, distant heart sounds AB: BS+, soft, nontender, no masses, no guarding or rebound Ext: cool feet, trace edema Neuro: follows commands, opens eyes spontaneously Principal Problem:  *Septic shock Active Problems:  Pyelonephritis  Hypoxemia  Acute encephalopathy  Nephrolithiasis  AKI (acute kidney injury)   ASSESSMENT AND PLAN  PULMONARY  Lab 04/05/12 0432 04/04/12 2051 04/04/12 1308 04/04/12 1155 04/04/12 0558 04/04/12 0323  PHART 7.345* 7.278* 7.217* -- 7.104* 7.329*  PCO2ART 29.5* 30.5* 34.8* -- 50.9* 30.2*  PO2ART 149.0* 150.0* 86.0 -- 89.0 69.1*  HCO3 16.1* 14.3* 14.2* --  15.8* 15.5*  O2SAT 99.0 99.0 94.0 73.2 92.0 --   Ventilator Settings: Vent Mode:  [-] PRVC FiO2 (%):  [40 %-60 %] 40 % Set Rate:  [24 bmp-30 bmp] 30 bmp Vt Set:  [350 mL-470 mL] 470 mL PEEP:  [8 cmH20-10 cmH20] 8 cmH20 Plateau Pressure:  [15 cmH20-23 cmH20] 22 cmH20 CXR:  Cardiomegally, bibasilar atelectasis 10/17: unchanged ETT:  10/15  A: Acute respiratory failure, uncompensated met acidosis Likely a degree of ALI (ratio meets 268 ALI at best now), cvp was up as well P:   - pcxr reviewed improved -plat 20, required TV at 8 cc for near arrest acidosis -maintain current MV -To goal peep 5 if able -pcxr in am  -abg in 6 hrs ensure not alk at thisat time  CARDIOVASCULAR  Lab 04/05/12 0437 04/05/12 0035 04/04/12 1807 04/04/12 1805 04/04/12 1230 04/04/12 0428 04/04/12 0210 04/03/12 2222  TROPONINI -- 1.49* 1.49* -- 1.12* 0.44* <0.30 --  LATICACIDVEN 3.1* -- -- 3.8* 3.0* -- -- 5.0*  PROBNP -- -- -- -- -- -- 8289.0* --   ECG:  Sinus tachy, nonspecific ST wave changes in lateral precordial leads Lines: 10/15 R IJ (EDP) >>  A: Septic shock: s/p 4x 1L NS bolus. On levophed and dobutamine.  Echo: EF: 20-25%. Septic cardiomyopathy likely Dobutamine added.  NSTEMI r/o, unlikely P:  -see ID -CVP: 18 - low MAP: continue norepinephrine for MAP>65. Norepinephrine currently at 25 -Sepsis protocol, levo at 30, vasopressin and dobutamine for SvO2<70. Was last 43 -at 24 hrs consider dc dobutamine and check svo2 -heparin hold with  bloody output -Asa hold, drop in plat -ecg in am  -cvp noted, limit volume today  -if to 5 levo, dc vaso  RENAL  Lab 04/05/12 0436 04/05/12 0435 04/05/12 0030 04/04/12 1209 04/04/12 0558 04/04/12 0428 04/04/12 0210  NA 134* 134* 132* 134* 136 -- --  K 3.5 3.4* -- -- -- -- --  CL 103 103 101 104 -- 103 --  CO2 16* 16* 15* 15* -- 15* --  BUN 43* 44* 43* 37* -- 34* --  CREATININE 2.62* 2.60* 2.55* 2.30* -- 2.00* --  CALCIUM 6.9* 6.9* 7.0* 6.9* -- 7.2* --    MG -- -- -- -- -- 1.2* 1.0*  PHOS -- -- -- -- -- 4.2 2.2*   Intake/Output      10/16 0701 - 10/17 0700 10/17 0701 - 10/18 0700   I.V. (mL/kg) 5076.3 (44.8)    NG/GT 30    IV Piggyback 600    Total Intake(mL/kg) 5706.3 (50.4)    Urine (mL/kg/hr) 516 (0.2)    Total Output 516    Net +5190.3          Foley:  10/15 >>  A:  AKI (unclear baseline Cr) likely due to septic shock History of nephrolithiasis now with L pyelonephritis: CT: impacted 35mm left upper ureteral stone at [facility].  - left JJ stent placed.  - no evidence of hydronephrosis on repeat US, perc drain held - metabolic acidosis with AG. Likely from lactic acidosis. With correction of albumin there is NO non AG P:   - monitor creatinine now that stent is placed. BMP in pm - repeat Mg level s/p replacement.  -no perc drain for now. Reassess if worsening clinical picture.  -bmet q8h -lactic acid trending down No role bicarb dc -urine output picking up  GASTROINTESTINAL  Lab 04/05/12 0436 04/04/12 0428 04/03/12 2221  AST 231* 127* 104*  ALT 247* 80* 71*  ALKPHOS 143* 209* 169*  BILITOT 0.7 0.9 0.9  PROT 4.6* 5.2* 5.1*  ALBUMIN 1.9* 2.4* 2.5*    A:  Increasing LFT's, likely from shock liver P:  -monitor LFT's  - tube feeds ppi  HEMATOLOGIC  Lab 04/05/12 0435 04/04/12 0558 04/04/12 0428 04/03/12 2221  HGB 11.4* 13.3 12.3 12.2  HCT 33.5* 39.0 37.5 36.3  PLT 72* -- 152 131*  INR -- -- -- --  APTT -- -- -- --   A:  Bleeding from urine Thrombocytopenia sepsis likely:  DIC unlikely  Fibrinogen normal but could be falsely normal in setting of acute inflammation.  P:  - peripheral blood smear for schistocytes, coags, D-dimer (Not specific)  -repeat CBC in pm -scd Hold sub qhep, consider restart after trend plat noted  INFECTIOUS  Lab 04/05/12 0435 04/04/12 0428 04/04/12 0210 04/03/12 2221  WBC 24.7* 5.1 -- 5.3  PROCALCITON -- -- 82.91 --   Cultures: 10/15 Urine >>e-coli  10/15 Blood >>gram negative  rods 10/15 Urine st/leg ag>>  Antibiotics: 10/15 zosyn x1  10/15 vanc (severe cap) >> 10/15 ceftriaxone (pyelo, severe CAP) >>10/16 10/15 azithro (severe cap) >>10/16 10/16: levofloxacin >>> 10/16: ceftaz>>> Lactic acid: 5 Elevated WBC at 24 A:  Septic shock: E-coli bacteremia likely from pyelonephritis with obstructive uropathy;  Missing organism? Continue shock P:   - continue ceftaz, levofloxacin. Narrow once sensitivities result -dc vanc -if remains on pressors in am , repeat BC - see renal, perc nephrostomy  ENDOCRINE  Lab 04/05/12 0412 04/05/12 04/04/12 2002 04/04/12 1631 04/04/12 1227  GLUCAP 144* 154* 193*  163* 115*   A:  No evidence rel AI P:   -monitor glucose -No role steroids  NEUROLOGIC  A:  Acute encephalopathy from sepsis, improving with resuscitation and antibiotics P:   -frequent orientation -treat pain carefully with fentanyl -WUA mandatory  BEST PRACTICE / DISPOSITION Level of Care:  ICU Primary Service:  PCCM Consultants:  Urology Code Status:  full Diet:  npo DVT Px:  scd GI Px:  n/a Skin Integrity:  normal Social / Family:  Updated daughter at bedside daily  CC time 35 minutes  Liam Graham, M.D. Pulmonary and [place] Pager: [phone]  04/05/2012, 7:42 AM  I have fully examined this patient and agree with above findings.    And edited infull  Lavon Paganini. Titus Mould, MD, [place] Pgr: [place] [facility] [facility]

## 2012-04-05 NOTE — Progress Notes (Signed)
Nutrition Follow-up  Intervention:    Start TF via OG tube with Oxepa at 20 ml/h (goal rate) with Prostat 60 ml TID to provide 1320 kcals (64% of estimated needs), 120 gm protein, and 377 ml free water daily.  Liquid MVI daily via tube to meet 100% DRI's   Assessment:   Patient remains intubated on ventilator support.  ARDS protocol ongoing. MV: 13.5 Temp:Temp (24hrs), Avg:98.4 F (36.9 C), Min:97.7 F (36.5 C), Max:98.7 F (37.1 C)  Diet Order:  NPO  Meds: Scheduled Meds:   . antiseptic oral rinse  15 mL Mouth Rinse QID  . artificial tears  1 application Both Eyes [id number]  . aspirin  324 mg Oral NOW   Or  . aspirin  300 mg Rectal NOW  . cefTAZidime (FORTAZ)  IV  2 g Intravenous Q24H  . chlorhexidine  15 mL Mouth Rinse BID  . cisatracurium  5 mg Intravenous Once  . famotidine (PEPCID) IV  20 mg Intravenous Q12H  . influenza  inactive virus vaccine  0.5 mL Intramuscular Tomorrow-1000  . levofloxacin (LEVAQUIN) IV  750 mg Intravenous Q48H  . magnesium sulfate 1 - 4 g bolus IVPB  4 g Intravenous Once  . sodium chloride  1,000 mL Intravenous Once  . sodium chloride  25 mL/kg Intravenous Once  . sodium chloride  500 mL Intravenous Once  . vancomycin  1,500 mg Intravenous Q48H  . DISCONTD: azithromycin  500 mg Intravenous Q24H  . DISCONTD: cefTRIAXone (ROCEPHIN)  IV  2 g Intravenous Q24H  . DISCONTD: heparin  5,000 Units Subcutaneous Q8H  . DISCONTD: midazolam  1 mg Intravenous Q6H  . DISCONTD: vancomycin  1,000 mg Intravenous Q24H   Continuous Infusions:   . sodium chloride 150 mL/hr at 04/04/12 1900  . sodium chloride    . cisatracurium (NIMBEX) infusion    . fentaNYL infusion INTRAVENOUS 40 mcg/hr (04/04/12 1900)  . midazolam (VERSED) infusion 6 mg/hr (04/05/12 0600)  . norepinephrine (LEVOPHED) Adult infusion 25 mcg/min (04/05/12 0500)  .  sodium bicarbonate infusion 1000 mL 75 mL/hr at 04/05/12 0200  . vasopressin (PITRESSIN) infusion - *FOR SHOCK* 0.03 Units/min  (04/04/12 1900)  . DISCONTD: fentaNYL infusion INTRAVENOUS 25 mcg/hr (04/04/12 1115)  . DISCONTD: norepinephrine (LEVOPHED) Adult infusion     PRN Meds:.sodium chloride, albuterol, DOBUTamine, fentaNYL, fentaNYL, midazolam, norepinephrine (LEVOPHED) Adult infusion, sodium chloride, vasopressin (PITRESSIN) infusion - *FOR SHOCK*, DISCONTD: fentaNYL, DISCONTD: midazolam, DISCONTD: midazolam  Labs:  CMP     Component Value Date/Time   NA 134* 04/05/2012 0436   K 3.5 04/05/2012 0436   CL 103 04/05/2012 0436   CO2 16* 04/05/2012 0436   GLUCOSE 159* 04/05/2012 0436   BUN 43* 04/05/2012 0436   CREATININE 2.62* 04/05/2012 0436   CALCIUM 6.9* 04/05/2012 0436   PROT 4.6* 04/05/2012 0436   ALBUMIN 1.9* 04/05/2012 0436   AST 231* 04/05/2012 0436   ALT 247* 04/05/2012 0436   ALKPHOS 143* 04/05/2012 0436   BILITOT 0.7 04/05/2012 0436   GFRNONAA 18* 04/05/2012 0436   GFRAA 21* 04/05/2012 0436     Intake/Output Summary (Last 24 hours) at 04/05/12 0911 Last data filed at 04/05/12 0600  Gross per 24 hour  Intake 5633.81 ml  Output    416 ml  Net 5217.81 ml    Weight Status:  113.2 kg trending up with positive fluid balance  Estimated needs:  2050 kcals, 105-120 gm protein, 2-2.2 liters fluid daily  Nutrition Dx: Inadequate oral intake related to inability to  eat as evidenced by NPO status; ongoing.  Goal:  Enteral nutrition to provide 60-70% of estimated calorie needs (22-25 kcals/kg ideal body weight) and >/= 90% of estimated protein needs, based on ASPEN guidelines for permissive underfeeding in critically ill obese individuals, unmet.  Monitor: TF tolerance/adequacy, weight trend, labs, I/O   Molli Barrows, RD, LDN, CNSC Pager# [phone] After Hours Pager# [phone]

## 2012-04-05 NOTE — Progress Notes (Signed)
  Amiodarone Drug - Drug Interaction Consult Note  Recommendations: Await susceptibilities on E. Coli and will try to discontinue Levaquin as soon as possible. Monitor QTc while on both amiodarone and Levaquin.   Amiodarone is metabolized by the cytochrome P450 system and therefore has the potential to cause many drug interactions. Amiodarone has an average plasma half-life of 50 days (range 20 to 100 days).   There is potential for drug interactions to occur several weeks or months after stopping treatment and the onset of drug interactions may be slow after initiating amiodarone.   []  Statins: Increased risk of myopathy. Simvastatin- restrict dose to 20mg  daily. Other statins: counsel patients to report any muscle pain or weakness immediately.  []  Anticoagulants: Amiodarone can increase anticoagulant effect. Consider warfarin dose reduction. Patients should be monitored closely and the dose of anticoagulant altered accordingly, remembering that amiodarone levels take several weeks to stabilize.  []  Antiepileptics: Amiodarone can increase plasma concentration of phenytoin, phenytoin dose should be reduced. Note that small changes in phenytoin dose can result in large changes in phenytoin levels. Monitor patient closely and counsel on signs of toxicity.  []  Beta blockers: increased risk of bradycardia, AV block and myocardial depression. Sotalol - avoid concomitant use.  []   Calcium channel blockers (diltiazem and verapamil): increased risk of bradycardia, AV block and myocardial depression.  []   Cyclosporine: Amiodarone increases levels of cyclosporine. Reduced dose of cyclosporine is recommended.  []  Digoxin dose should be halved when amiodarone is started.  []  Diuretics: increased risk of cardiotoxicity if hypokalemia occurs.  []  Oral hypoglycemic agents (glyburide, glipizide, glimepiride): increased risk of hypoglycemia. Patient's glucose levels should be monitored closely when  initiating amiodarone therapy.   [x]  Drugs that prolong the QT interval: Concurrent therapy is contraindicated due to the increased risk of torsades de pointes; . Antibiotics: e.g. fluoroquinolones, erythromycin. . Antiarrhythmics: e.g. quinidine, procainamide, disopyramide, sotalol. . Antipsychotics: e.g. phenothiazines, haloperidol.  . Lithium, tricyclic antidepressants, and methadone.  Thank You,  Sandford Craze  04/05/2012 3:02 PM

## 2012-04-05 NOTE — Progress Notes (Signed)
1 Day Post-Op Subjective: Patient reports  Pt sedated and with ETT intact.  Objective: Vital signs in last 24 hours: Temp:  [97.7 F (36.5 C)-98.7 F (37.1 C)] 98.6 F (37 C) (10/17 1600) Pulse Rate:  [47-112] 94  (10/17 1700) Resp:  [28-30] 30  (10/17 1700) BP: (89-126)/(26-87) 117/76 mmHg (10/17 1700) SpO2:  [82 %-100 %] 100 % (10/17 1700) Arterial Line BP: (84-147)/(45-78) 121/67 mmHg (10/17 1700) FiO2 (%):  [40 %-60 %] 40 % (10/17 1700) Weight:  [113.2 kg (249 lb 9 oz)] 113.2 kg (249 lb 9 oz) (10/17 0458)  Intake/Output from previous day: 10/16 0701 - 10/17 0700 In: 5841.8 [I.V.:5211.8; NG/GT:30; IV Piggyback:600] Out: 516 [Urine:516] Intake/Output this shift: Total I/O In: 1145.5 [I.V.:915.5; NG/GT:130; IV Piggyback:100] Out: 280 [Urine:280]  Physical Exam:  General: sedated with ETT intact. Family at bedside GI: not done    Lab Results:  Basename 04/05/12 1216 04/05/12 0435 04/04/12 0558  HGB 11.5* 11.4* 13.3  HCT 33.5* 33.5* 39.0   BMET  Basename 04/05/12 1217 04/05/12 0436  NA 132* 134*  K 3.6 3.5  CL 101 103  CO2 18* 16*  GLUCOSE 177* 159*  BUN 44* 43*  CREATININE 2.74* 2.62*  CALCIUM 7.3* 6.9*    Basename 04/05/12 1216  LABPT --  INR 1.48   No results found for this basename: LABURIN:1 in the last 72 hours Results for orders placed during the hospital encounter of 04/03/12  URINE CULTURE     Status: Normal   Collection Time   04/03/12 10:19 PM      Component Value Range Status Comment   Specimen Description URINE, RANDOM   Final    Special Requests CX ADDED AT 0332    Final    Culture  Setup Time 04/04/2012 03:42   Final    Colony Count >=100,000 COLONIES/ML   Final    Culture ESCHERICHIA COLI   Final    Report Status 04/05/2012 FINAL   Final    Organism ID, Bacteria ESCHERICHIA COLI   Final   CULTURE, BLOOD (ROUTINE X 2)     Status: Normal (Preliminary result)   Collection Time   04/03/12 10:20 PM      Component Value Range Status  Comment   Specimen Description BLOOD RIGHT ARM   Final    Special Requests BOTTLES DRAWN AEROBIC AND ANAEROBIC 5CC EACH   Final    Culture  Setup Time 04/04/2012 04:51   Final    Culture     Final    Value: ESCHERICHIA COLI     16 Note: Gram Stain Report Called to,Read Back By and Verified With: ASHLEY HARRISON AT 8:10PM 10 13 BY THOMI   Report Status PENDING   Incomplete   CULTURE, BLOOD (ROUTINE X 2)     Status: Normal (Preliminary result)   Collection Time   04/03/12 10:30 PM      Component Value Range Status Comment   Specimen Description BLOOD RIGHT HAND   Final    Special Requests BOTTLES DRAWN AEROBIC ONLY 1CC   Final    Culture  Setup Time 04/04/2012 04:51   Final    Culture     Final    Value: ESCHERICHIA COLI     16 Note: Gram Stain Report Called to,Read Back By and Verified With: ASHLEY HARRISON AT 8:10PM 10 13 BY [id number]   Report Status PENDING   Incomplete   MRSA PCR SCREENING     Status: Normal   Collection Time  04/04/12  2:05 AM      Component Value Range Status Comment   MRSA by PCR NEGATIVE  NEGATIVE Final     Studies/Results: Ct Abdomen Pelvis Wo Contrast  04/04/2012  *RADIOLOGY REPORT*  Clinical Data: Fever.  Urinary tract infection.  Sepsis.  History of nephrolithiasis.  CT ABDOMEN AND PELVIS WITHOUT CONTRAST  Technique:  Multidetector CT imaging of the abdomen and pelvis was performed following the standard protocol without intravenous contrast.  Comparison: 02/25/2005  Findings: Atelectasis or infiltration in both lung bases.  Large esophageal hiatal hernia.  Multiple intrarenal stones bilaterally, with four stones in the right kidney and two stones in the left kidney.  In the left ureteropelvic junction, there is an additional 7 mm stone.  There is a moderate proximal pyelocaliectasis with periureteral and pararenal stranding suggesting moderate obstruction.  The distal left ureter is decompressed.  No right ureteral stones are demonstrated.  Infection is  not excluded but no abscesses are demonstrated.  Surgical absence of the gallbladder.  The unenhanced appearance of the liver, spleen, pancreas, adrenal glands, abdominal aorta, and retroperitoneal lymph nodes is unremarkable.  Small periumbilical hernia containing fat.  The stomach, small bowel, and colon are decompressed without abnormal distension.  Stool filled colon.  No free air or free fluid in the abdomen.  Pelvis:  Diverticula in the sigmoid colon without diverticulitis. The appendix is normal.  The bladder is decompressed with Foley catheter.  No significant pelvic lymphadenopathy.  Degenerative changes in the lumbar spine.  IMPRESSION: 7 mm obstructing stone in the proximal left ureter at the ureteropelvic junction.  Pararenal and periureteral stranding. Bilateral nonobstructing intrarenal stones.  Large esophageal hiatal hernia with much of the stomach above the diaphragm. Atelectasis or infiltration in both lung bases.   Original Report Authenticated By: Neale Burly, M.D.    Dg Retrograde Pyelogram  04/04/2012  *RADIOLOGY REPORT*  Clinical Data: Left ureter stone and placement of stent.  RETROGRADE PYELOGRAM  Comparison: CT 04/04/2012  Findings: Opacification of the left upper renal collecting system demonstrates a stone near the left ureteropelvic junction.  A stent was placed within the left renal pelvis. Stone may have been advanced into the left renal pelvis on the last image.  IMPRESSION: Placement of left ureteral stent.  Stone at the left ureteropelvic junction.   Original Report Authenticated By: Markus Daft, M.D.    US Renal Port  04/04/2012  *RADIOLOGY REPORT*  Clinical Data: Rule out obstruction.  Renal insufficiency  RENAL/URINARY TRACT ULTRASOUND COMPLETE  Comparison:  CT 04/04/2012  Findings:  Right Kidney:  10.8 cm in length.  No hydronephrosis.  8 mm right lower pole stone.  Negative for mass lesion.  Cortical echogenicity is normal.  Left Kidney:  12.2 cm in length.   15 mm  rounded hypoechoic structure in the left renal hilum.  No mass lesion is seen in this area on the CT scan.  This could be blood in the collecting system or possibly an occult renal lesion.  No hydronephrosis.  Bladder:  Foley catheter with empty urinary bladder.  IMPRESSION: 8 mm right renal stone without obstruction.  Left-sided hydronephrosis has improved since the prior CT.  15 mm hypoechoic round structure in the left renal hilum of uncertain significance.  This could be blood in the collecting system.   Original Report Authenticated By: Truett Perna, M.D.    Portable Chest Xray In Am  04/05/2012  *RADIOLOGY REPORT*  Clinical Data: Septic shock.  PORTABLE CHEST -  1 VIEW  Comparison: Portable chest 04/04/2012 and 04/03/2012.  Findings: The patient has a new NG tube which courses into the stomach and below the inferior margin of the film.  Endotracheal tube and right IJ catheter appear unchanged.  Small bilateral pleural effusions and bilateral airspace disease, both worse on the left, persist without notable change.  There is cardiomegaly.  No pneumothorax.  IMPRESSION: No change in small bilateral pleural effusions and left greater than right airspace disease.   Original Report Authenticated By: Arvid Right. Luther Parody, M.D.    Portable Chest Xray  04/04/2012  *RADIOLOGY REPORT*  Clinical Data: Check ET tube position.  PORTABLE CHEST - 1 VIEW  Comparison: 04/04/2012 at 0352 hours  Findings: Endotracheal tube has been placed with tip about 3.1 cm above the carina.  Right central venous catheter remains with tip over the mid SVC region.  Shallow inspiration.  Cardiac enlargement with some pulmonary vascular congestion.  No focal airspace consolidation in the lungs.  No pneumothorax.  IMPRESSION: Endotracheal tube tip is about 3.1 cm above the carina.   Original Report Authenticated By: Neale Burly, M.D.    Dg Chest Port 1 View  04/04/2012  *RADIOLOGY REPORT*  Clinical Data: Respiratory failure.   PORTABLE CHEST - 1 VIEW  Comparison: 04/03/2012  Findings: Shallow inspiration.  Mild cardiac enlargement with normal pulmonary vascularity.  Right central venous catheter over the mid SVC region.  No blunting of costophrenic angles.  No pneumothorax. Improved vascular congestion since previous study.  IMPRESSION: Improved vascular congestion since previous study.  No developing consolidation.  Shallow inspiration.   Original Report Authenticated By: Neale Burly, M.D.    Dg Chest Portable 1 View  04/04/2012  *RADIOLOGY REPORT*  Clinical Data: Shortness of breath and line placement.  PORTABLE CHEST - 1 VIEW  Comparison: 04/03/2012 at 2220 hours  Findings: 2354 hours.  Right IJ central line terminates at low SVC, without pneumothorax. Cardiomegaly accentuated by AP portable technique.  No right and no definite left pleural fluid.  Low lung volumes with mild pulmonary venous congestion.  Probable left base atelectasis.  IMPRESSION: Right IJ central line terminating at mid to low SVC, without pneumothorax.  Mild pulmonary venous congestion, superimposed upon diminished lung volumes.   Original Report Authenticated By: Areta Haber, M.D.    Dg Chest Portable 1 View  04/03/2012  *RADIOLOGY REPORT*  Clinical Data: Shortness of breath.  PORTABLE CHEST - 1 VIEW  Comparison: 03/23/2005  Findings: Numerous leads and wires project over the chest.  Patient rotated left.  Mild cardiomegaly.  Cannot exclude small left pleural effusion; soft tissues project over the left costophrenic angle on the frontal. No pneumothorax.  Low lung volumes.  Possible concurrent mild pulmonary venous congestion.  Mild volume loss at the left lung base.  Moderate to large hiatal hernia.  IMPRESSION:  1.  Cardiomegaly and low lung volumes.  Cannot exclude mild pulmonary venous congestion. 2.  Moderate-to-large hiatal hernia. 3.  Degraded AP portable view secondary overlying soft tissues at the left lung base.  Consider PA and lateral  radiographs, if possible.   Original Report Authenticated By: Areta Haber, M.D.     Assessment/Plan: Kidney Stones Continue ABX therapy due to Post-op infection Continue foley due to patient in ICU  Status post urgent cystoureterscopy, Retrograde pyelogram and placement of double J stent by Dr. Gaynelle Arabian. ARF is improving slowly. UO has improved. Foley draining cloudy urine w/hematuria. No clots. Discussed w/family that double J stent can  stay in place for up to 6 months w/o exchange. Recommendation is to maintain foley til cleared by medicine to remove. Will f/u w/Dr. Gaynelle Arabian post discharge from hospital to discuss long term treatment of 8 mm ureteral calculus. At this time urology will sign off. If further assistance required please contact Dr. Gaynelle Arabian   LOS: 2 days   [facility] 04/05/2012, 5:56 PM

## 2012-04-06 ENCOUNTER — Inpatient Hospital Stay (HOSPITAL_COMMUNITY): Payer: Medicare Other

## 2012-04-06 LAB — CBC
HCT: 34.3 % — ABNORMAL LOW (ref 36.0–46.0)
HCT: 33.1 % — ABNORMAL LOW (ref 36.0–46.0)
Hemoglobin: 11.9 g/dL — ABNORMAL LOW (ref 12.0–15.0)
Hemoglobin: 11.5 g/dL — ABNORMAL LOW (ref 12.0–15.0)
MCH: 29 pg (ref 26.0–34.0)
MCH: 28.7 pg (ref 26.0–34.0)
MCHC: 34.7 g/dL (ref 30.0–36.0)
MCHC: 34.7 g/dL (ref 30.0–36.0)
MCV: 83.5 fL (ref 78.0–100.0)
MCV: 82.5 fL (ref 78.0–100.0)
Platelets: 84 10*3/uL — ABNORMAL LOW (ref 150–400)
Platelets: 64 10*3/uL — ABNORMAL LOW (ref 150–400)
RBC: 4.11 MIL/uL (ref 3.87–5.11)
RBC: 4.01 MIL/uL (ref 3.87–5.11)
RDW: 15.4 % (ref 11.5–15.5)
RDW: 15.4 % (ref 11.5–15.5)
WBC: 31.8 10*3/uL — ABNORMAL HIGH (ref 4.0–10.5)
WBC: 29.9 10*3/uL — ABNORMAL HIGH (ref 4.0–10.5)

## 2012-04-06 LAB — URINALYSIS, MICROSCOPIC ONLY
Bilirubin Urine: NEGATIVE
Glucose, UA: NEGATIVE mg/dL
Ketones, ur: NEGATIVE mg/dL
Nitrite: NEGATIVE
Protein, ur: 100 mg/dL — AB
Specific Gravity, Urine: 1.012 (ref 1.005–1.030)
Urobilinogen, UA: 0.2 mg/dL (ref 0.0–1.0)
pH: 5.5 (ref 5.0–8.0)

## 2012-04-06 LAB — POCT I-STAT 3, ART BLOOD GAS (G3+)
Acid-base deficit: 7 mmol/L — ABNORMAL HIGH (ref 0.0–2.0)
Acid-base deficit: 6 mmol/L — ABNORMAL HIGH (ref 0.0–2.0)
Bicarbonate: 16.5 mEq/L — ABNORMAL LOW (ref 20.0–24.0)
Bicarbonate: 17.9 mEq/L — ABNORMAL LOW (ref 20.0–24.0)
O2 Saturation: 98 %
O2 Saturation: 98 %
Patient temperature: 100.4
TCO2: 17 mmol/L (ref 0–100)
TCO2: 19 mmol/L (ref 0–100)
pCO2 arterial: 27.2 mmHg — ABNORMAL LOW (ref 35.0–45.0)
pCO2 arterial: 30.4 mmHg — ABNORMAL LOW (ref 35.0–45.0)
pH, Arterial: 7.391 (ref 7.350–7.450)
pH, Arterial: 7.381 (ref 7.350–7.450)
pO2, Arterial: 111 mmHg — ABNORMAL HIGH (ref 80.0–100.0)
pO2, Arterial: 104 mmHg — ABNORMAL HIGH (ref 80.0–100.0)

## 2012-04-06 LAB — BASIC METABOLIC PANEL
BUN: 57 mg/dL — ABNORMAL HIGH (ref 6–23)
BUN: 59 mg/dL — ABNORMAL HIGH (ref 6–23)
CO2: 16 mEq/L — ABNORMAL LOW (ref 19–32)
CO2: 18 mEq/L — ABNORMAL LOW (ref 19–32)
Calcium: 8 mg/dL — ABNORMAL LOW (ref 8.4–10.5)
Calcium: 8.1 mg/dL — ABNORMAL LOW (ref 8.4–10.5)
Chloride: 104 mEq/L (ref 96–112)
Chloride: 103 mEq/L (ref 96–112)
Creatinine, Ser: 2.77 mg/dL — ABNORMAL HIGH (ref 0.50–1.10)
Creatinine, Ser: 2.7 mg/dL — ABNORMAL HIGH (ref 0.50–1.10)
GFR calc Af Amer: 19 mL/min — ABNORMAL LOW (ref >90)
GFR calc Af Amer: 20 mL/min — ABNORMAL LOW (ref >90)
GFR calc non Af Amer: 17 mL/min — ABNORMAL LOW (ref >90)
GFR calc non Af Amer: 17 mL/min — ABNORMAL LOW (ref >90)
Glucose, Bld: 161 mg/dL — ABNORMAL HIGH (ref 70–99)
Glucose, Bld: 147 mg/dL — ABNORMAL HIGH (ref 70–99)
Potassium: 3.4 mEq/L — ABNORMAL LOW (ref 3.5–5.1)
Potassium: 3.4 mEq/L — ABNORMAL LOW (ref 3.5–5.1)
Sodium: 133 mEq/L — ABNORMAL LOW (ref 135–145)
Sodium: 135 mEq/L (ref 135–145)

## 2012-04-06 LAB — CULTURE, BLOOD (ROUTINE X 2)

## 2012-04-06 LAB — GLUCOSE, CAPILLARY
Glucose-Capillary: 161 mg/dL — ABNORMAL HIGH (ref 70–99)
Glucose-Capillary: 145 mg/dL — ABNORMAL HIGH (ref 70–99)
Glucose-Capillary: 161 mg/dL — ABNORMAL HIGH (ref 70–99)
Glucose-Capillary: 169 mg/dL — ABNORMAL HIGH (ref 70–99)
Glucose-Capillary: 171 mg/dL — ABNORMAL HIGH (ref 70–99)
Glucose-Capillary: 140 mg/dL — ABNORMAL HIGH (ref 70–99)
Glucose-Capillary: 90 mg/dL (ref 70–99)
Glucose-Capillary: 95 mg/dL (ref 70–99)

## 2012-04-06 LAB — PROTIME-INR
INR: 1.22 (ref 0.00–1.49)
Prothrombin Time: 15.2 seconds (ref 11.6–15.2)

## 2012-04-06 LAB — MAGNESIUM: Magnesium: 2.3 mg/dL (ref 1.5–2.5)

## 2012-04-06 LAB — HEPARIN LEVEL (UNFRACTIONATED): Heparin Unfractionated: <0.1 IU/mL — ABNORMAL LOW (ref 0.30–0.70)

## 2012-04-06 MED ORDER — HEPARIN (PORCINE) IN NACL 100-0.45 UNIT/ML-% IJ SOLN
1900.0000 [IU]/h | INTRAMUSCULAR | Status: DC
  Administered 2012-04-06: 1600 [IU]/h via INTRAVENOUS
  Administered 2012-04-06: 1300 [IU]/h via INTRAVENOUS
  Administered 2012-04-07: 1900 [IU]/h via INTRAVENOUS
  Filled 2012-04-06 (×3): qty 250

## 2012-04-06 MED ORDER — DEXTROSE 5 % IV SOLN
1.0000 g | INTRAVENOUS | Status: DC
  Administered 2012-04-06 – 2012-04-10 (×5): 1 g via INTRAVENOUS
  Filled 2012-04-06 (×5): qty 10

## 2012-04-06 MED ORDER — MIDAZOLAM HCL 2 MG/2ML IJ SOLN
1.0000 mg | INTRAMUSCULAR | Status: DC | PRN
  Administered 2012-04-07 – 2012-04-08 (×7): 2 mg via INTRAVENOUS
  Filled 2012-04-06 (×7): qty 2

## 2012-04-06 NOTE — Progress Notes (Signed)
ANTICOAGULATION CONSULT NOTE -Follow Up Consult  Pharmacy Consult for Heparin Indication: Atrial fibrillation  No Known Allergies  Patient Measurements: Height: 5\' 4"  (162.6 cm) Weight: 253 lb 1.4 oz (114.8 kg) IBW/kg (Calculated) : 54.7  Heparin Dosing Weight: 82 kg  Vital Signs: Temp: 98.4 F (36.9 C) (10/18 1600) Temp src: Oral (10/18 1600) BP: 114/58 mmHg (10/18 1800) Pulse Rate: 108  (10/18 1800)  Labs:  Basename 04/06/12 1930 04/06/12 1618 04/06/12 1144 04/06/12 0409 04/05/12 2009 04/05/12 1216 04/05/12 0035 04/04/12 1807 04/04/12 1230  HGB -- 11.5* -- 11.9* -- -- -- -- --  HCT -- 33.1* -- 34.3* -- 33.5* -- -- --  PLT -- 64* -- 84* -- 71* -- -- --  APTT -- -- -- -- -- 47* -- -- --  LABPROT 15.2 -- -- -- -- 17.5* -- -- --  INR 1.22 -- -- -- -- 1.48 -- -- --  HEPARINUNFRC <0.10* -- -- -- -- -- -- -- --  CREATININE -- -- 2.70* 2.77* 2.73* -- -- -- --  CKTOTAL -- -- -- -- -- -- -- -- --  CKMB -- -- -- -- -- -- -- -- --  TROPONINI -- -- -- -- -- -- 1.49* 1.49* 1.12*    Estimated Creatinine Clearance: 25.1 ml/min (by C-G formula based on Cr of 2.7).   Medical History: Past Medical History  Diagnosis Date  . Hypertension   . Pneumonia   . GERD (gastroesophageal reflux disease)   . Headache     Assessment: 66 yo female with atrial fibrillation to be started on heparin. H/H stable, platelets low and trending down now 64.. Monitor closely. No reported bleeding - urine is pink.   Physician requested that there be no bolus, confirmed with Dr. Earnest Conroy to continue despite platelet trend down  Goal of Therapy:  Heparin 0.3-0.7 Monitor platelets by anticoagulation protocol: Yes   Plan:  Increase heparin to 1600 units/hr Check heparin level in 8 hours due to renal dysfunction Monitor daily CBC and heparin level   Thank you for allowing pharmacy to be a part of this patients care team.  Rowe Robert Pharm.D., BCPS Clinical Pharmacist 04/06/2012 8:26 PM Pager: [phone] Phone: [phone]

## 2012-04-06 NOTE — Progress Notes (Signed)
I agree with student's note 

## 2012-04-06 NOTE — Progress Notes (Signed)
Name: [name] MRN: [id number] DOB: 11-23-1945    LOS: 3  Referring Provider:  Zenia Resides EDP Reason for Referral:  Septic shock  PULMONARY / CRITICAL CARE MEDICINE  Brief patient description:  This is a 66 y/o female admitted on 10/16 to [facility] with septic shock likely from pyelonephritis.  DDx includes an obstructing kidney stone given her history of nephrolithiasis as well as (less likely) severe CAP given hypoxemia and crackles in left base.  Events Since Admission: 10/15 CT Ab/pelv (stone protocol) >> 10/15: ETT>>> 10/16: Cystoscopy, Left Retrograde Pyelogram, with interpretation, and Left JJ stent ( 107F x 24 cm). 10/17- refractory shock, acidosis, fib, new dx septic cardiomyopathy likely  Current Status: Slow reduction pressors, afib on amio rate controlled  Vital Signs: Temp:  [97.7 F (36.5 C)-98.6 F (37 C)] 98.6 F (37 C) (10/18 0322) Pulse Rate:  [47-112] 107  (10/18 0400) Resp:  [22-30] 30  (10/18 0600) BP: (86-140)/(50-90) 110/68 mmHg (10/18 0600) SpO2:  [82 %-100 %] 94 % (10/18 0400) Arterial Line BP: (76-147)/(45-78) 119/69 mmHg (10/18 0600) FiO2 (%):  [40 %-50 %] 50 % (10/18 0600) Weight:  [253 lb 1.4 oz (114.8 kg)] 253 lb 1.4 oz (114.8 kg) (10/18 0500)  Physical Examination: Gen: intubated, not following commands.  HEENT: NCAT, MM dry, PERRL, EOMi PULM: coarse breath sounds CV: Tachy, irregular, distant heart sounds AB: BS+, soft, nontender, no masses, no guarding or rebound Ext: cool feet, dorsalis pedis pulse felt on left but diminished on right.  Neuro: sedated, doesn't follow commands Principal Problem:  *Septic shock Active Problems:  Pyelonephritis  Hypoxemia  Acute encephalopathy  Nephrolithiasis  AKI (acute kidney injury)   ASSESSMENT AND PLAN  PULMONARY  Lab 04/06/12 0429 04/05/12 1532 04/05/12 1210 04/05/12 0432 04/04/12 2051 04/04/12 1308  PHART 7.391 7.388 -- 7.345* 7.278* 7.217*  PCO2ART 27.2* 26.6* -- 29.5* 30.5* 34.8*  PO2ART  111.0* 121.0* -- 149.0* 150.0* 86.0  HCO3 16.5* 15.7* -- 16.1* 14.3* 14.2*  O2SAT 98.0 99.1 78.3 99.0 99.0 --   Ventilator Settings: Vent Mode:  [-] PRVC FiO2 (%):  [40 %-50 %] 50 % Set Rate:  [30 bmp] 30 bmp Vt Set:  [470 mL-4701 mL] 470 mL PEEP:  [5 cmH20-8 cmH20] 5 cmH20 Plateau Pressure:  [19 cmH20-22 cmH20] 22 cmH20 CXR:  Cardiomegally, bibasilar atelectasis 10/18: increased atelectasis vs infiltrate in mid and lower left lobe - layering effusion? ETT:  10/15  A: Acute respiratory failure, uncompensated met acidosis improved Likely a degree of ALI (ratio meets 268 ALI at best now), cvp was up as well Left effusion likely P:   - pcxr reviewed: worsening layering effusion? -abg reviewed  - currently on PEEP 5, 50% FiO2, rate 30.  abg reviewed, drop to 40%, reduce rate 24, abg to follow -after abg review on lower MV, may consider SBT  CARDIOVASCULAR  Lab 04/05/12 0437 04/05/12 0035 04/04/12 1807 04/04/12 1805 04/04/12 1230 04/04/12 0428 04/04/12 0210 04/03/12 2222  TROPONINI -- 1.49* 1.49* -- 1.12* 0.44* <0.30 --  LATICACIDVEN 3.1* -- -- 3.8* 3.0* -- -- 5.0*  PROBNP -- -- -- -- -- -- 8289.0* --   ECG:  Sinus tachy, nonspecific ST wave changes in lateral precordial leads 10/18: a-fib at 95bpm Lines: 10/15 R IJ (EDP) >>  A: Septic shock: Currently on norepinephrine.  Echo: EF: 20-25%. Septic cardiomyopathy likely  NSTEMI r/o, unlikely New onset a-fib 10/17: loaded with amio, now on amio gtt P:  -see ID - MAP:77 on norepinephrine, dc vasopressin -  Sepsis protocol completed -Asa hold, drop in plat -ecg this am no sig st changes -will need echo in 10 days - continue amio for a-fib, hold heparin in setting of bloody output and low platelets. -cvp 7 ? Accuracy? Remains in fib , no further bleeding, add hep IV , no bolus  RENAL  Lab 04/06/12 0409 04/05/12 2009 04/05/12 1217 04/05/12 1216 04/05/12 0436 04/05/12 0435 04/04/12 0428 04/04/12 0210  NA 133* 132* 132* -- 134*  134* -- --  K 3.4* 3.5 -- -- -- -- -- --  CL 104 103 101 -- 103 103 -- --  CO2 16* 15* 18* -- 16* 16* -- --  BUN 57* 52* 44* -- 43* 44* -- --  CREATININE 2.77* 2.73* 2.74* -- 2.62* 2.60* -- --  CALCIUM 8.0* 7.7* 7.3* -- 6.9* 6.9* -- --  MG 2.3 -- -- 2.1 -- -- 1.2* 1.0*  PHOS -- -- -- -- -- -- 4.2 2.2*   Intake/Output      10/17 0701 - 10/18 0700 10/18 0701 - 10/19 0700   I.V. (mL/kg) 1945 (16.9)    NG/GT 540    IV Piggyback 100    Total Intake(mL/kg) 2585 (22.5)    Urine (mL/kg/hr) 730 (0.3)    Stool 1    Total Output 731    Net +1854          Foley:  10/15 >>  A:  ATN from shock, increased urine output 10/18 History of nephrolithiasis now with L pyelonephritis: CT: impacted 31mm left upper ureteral stone at [facility].  - left JJ stent placed.  - no evidence of hydronephrosis on repeat US, perc drain held - hypomagnesemia: resolved s/p mag replacement P:   Plat of crt and increased output - monitor creatinine now that stent is placed. BMP in pm  -no perc drain for now. Urology signing off. Double J stent x 82months.  -bmet qday -urine output picking up  GASTROINTESTINAL  Lab 04/05/12 0436 04/04/12 0428 04/03/12 2221  AST 231* 127* 104*  ALT 247* 80* 71*  ALKPHOS 143* 209* 169*  BILITOT 0.7 0.9 0.9  PROT 4.6* 5.2* 5.1*  ALBUMIN 1.9* 2.4* 2.5*    A:  Increasing LFT's, likely from shock liver P:  -monitor LFT's in am , see prior likely shock liver - tube feeds continue ppi  HEMATOLOGIC  Lab 04/06/12 0409 04/05/12 1216 04/05/12 0435 04/04/12 0558 04/04/12 0428 04/03/12 2221  HGB 11.9* 11.5* 11.4* 13.3 12.3 --  HCT 34.3* 33.5* 33.5* 39.0 37.5 --  PLT 84* 71* 72* -- 152 131*  INR -- 1.48 -- -- -- --  APTT -- 47* -- -- -- --   A:  Bleeding from urine-resolving Thrombocytopenia sepsis likely:  DIC unlikely  Fibrinogen normal but could be falsely normal in setting of acute inflammation. Elevated INR, PT, aPTT and d-dimer. Likely from sepsis. No schistocytes.  P:    -repeat CBC in pm -scd -high risk dvt and cva emblic, plat trend good, no further bleeding = hep iv fib, cbc in am for trend plat -wbc from increased urine output? residaul demarg?  INFECTIOUS  Lab 04/06/12 0409 04/05/12 1216 04/05/12 0435 04/04/12 0428 04/04/12 0210 04/03/12 2221  WBC 31.8* 26.0* 24.7* 5.1 -- 5.3  PROCALCITON -- -- -- -- 82.91 --   Cultures: 10/15 Urine >>e-coli pansensitive 10/15 Blood >>e-coli, pansensitive.  10/15 Urine st/leg ag>>  Antibiotics: 10/15 zosyn x1  10/15 vanc (severe cap) >> 10/15 ceftriaxone (pyelo, severe CAP) >>10/16 10/15 azithro (  severe cap) >>10/16 10/16: levofloxacin >>>10/18 10/16: ceftaz>>>10/18  10/18 ceftriaxone>>>  Lactic acid: 5 Elevated WBC  A:  Septic shock: E-coli bacteremia from pyelonephritis with obstructive uropathy;  Rising wbc, no fevers P:   -narrow to ceftraixone, no stop date as of yet , still on pressors and with stent that is at risk infection seeding -repeat BC given that she remains on pressors  UA  ENDOCRINE  Lab 04/06/12 0321 04/05/12 1223 04/05/12 0817 04/05/12 0412 04/05/12  GLUCAP 161* 131* 142* 144* 154*   A:  No evidence rel AI P:   -monitor glucose, controlled -tsh wnl -No role steroids  NEUROLOGIC  A:  Acute encephalopathy from sepsis, improving with resuscitation and antibiotics P:   -frequent orientation -treat pain carefully with fentanyl -WUA mandatory Dc versed drip, add prn  -chair position if able  BEST PRACTICE / DISPOSITION Level of Care:  ICU Primary Service:  PCCM Consultants:  Urology: signed out 10/17 Code Status:  full Diet:  TF DVT Px:  Scd, hep IV fib 10/18>>> GI Px:  pepcid Skin Integrity:  normal Social / Family:  Updated daughter at bedside daily  CC time 30 minutes  Liam Graham, M.D. Pulmonary and [place] Pager: [phone]  04/06/2012, 7:32 AM  I have fully examined this patient and agree with above  findings.    And edited infull  Lavon Paganini. Titus Mould, MD, [place] Pgr: [facility]

## 2012-04-06 NOTE — Research (Signed)
Patient enrolled into the [employer] [place] Research Study, a double-blind, randomized, placebo-controlled trial investigating the effects of IV administered typical and atypical antipsychotics (haloperidol & ziprasidone) on delirium in critically ill patients. The study was discussed with patient's daughter. All questions and concerns were addressed prior to daughter signing the consent. No study procedures were initiated prior to obtaining informed consent.  A copy of the signed consent was provided to the daughter. Currently unable to complete a CAM ICU because RASS is a -4. Study discussed with patient's bedside RN. Dr. Titus Mould aware of the enrollment into the [employer] [place] Study. Please contact study coordinator at [phone] for any questions or concerns.

## 2012-04-06 NOTE — Progress Notes (Signed)
ANTICOAGULATION CONSULT NOTE - Initial Consult  Pharmacy Consult for Heparin Indication: Atrial fibrillation  No Known Allergies  Patient Measurements: Height: 5\' 4"  (162.6 cm) Weight: 253 lb 1.4 oz (114.8 kg) IBW/kg (Calculated) : 54.7  Heparin Dosing Weight: 82 kg  Vital Signs: Temp: 99.1 F (37.3 C) (10/18 0828) Temp src: Oral (10/18 0828) BP: 114/76 mmHg (10/18 0800) Pulse Rate: 110  (10/18 0747)  Labs:  Basename 04/06/12 0409 04/05/12 2009 04/05/12 1217 04/05/12 1216 04/05/12 0435 04/05/12 0035 04/04/12 1807 04/04/12 1230  HGB 11.9* -- -- 11.5* -- -- -- --  HCT 34.3* -- -- 33.5* 33.5* -- -- --  PLT 84* -- -- 71* 72* -- -- --  APTT -- -- -- 47* -- -- -- --  LABPROT -- -- -- 17.5* -- -- -- --  INR -- -- -- 1.48 -- -- -- --  HEPARINUNFRC -- -- -- -- -- -- -- --  CREATININE 2.77* 2.73* 2.74* -- -- -- -- --  CKTOTAL -- -- -- -- -- -- -- --  CKMB -- -- -- -- -- -- -- --  TROPONINI -- -- -- -- -- 1.49* 1.49* 1.12*    Estimated Creatinine Clearance: 24.5 ml/min (by C-G formula based on Cr of 2.77).   Medical History: Past Medical History  Diagnosis Date  . Hypertension   . Pneumonia   . GERD (gastroesophageal reflux disease)   . Headache     Assessment: 66 yo female with atrial fibrillation to be started on heparin. CBC stable, with low platelets at 84. Monitor closely. No reported bleeding - blood in urine has resolved. Physician requested that there be no bolus.  Goal of Therapy:  Heparin 0.3-0.7 Monitor platelets by anticoagulation protocol: Yes   Plan:  Start heparin 1300 units/hr Check heparin level in 8 hours due to renal dysfunction Monitor daily CBC and heparin level  Nicoletta Ba PharmD Candidate 04/06/2012,10:21 AM

## 2012-04-07 ENCOUNTER — Inpatient Hospital Stay (HOSPITAL_COMMUNITY): Payer: Medicare Other

## 2012-04-07 LAB — APTT: aPTT: 50 seconds — ABNORMAL HIGH (ref 24–37)

## 2012-04-07 LAB — GLUCOSE, CAPILLARY
Glucose-Capillary: 100 mg/dL — ABNORMAL HIGH (ref 70–99)
Glucose-Capillary: 110 mg/dL — ABNORMAL HIGH (ref 70–99)
Glucose-Capillary: 117 mg/dL — ABNORMAL HIGH (ref 70–99)
Glucose-Capillary: 130 mg/dL — ABNORMAL HIGH (ref 70–99)
Glucose-Capillary: 73 mg/dL (ref 70–99)
Glucose-Capillary: 95 mg/dL (ref 70–99)

## 2012-04-07 LAB — CBC
HCT: 32.8 % — ABNORMAL LOW (ref 36.0–46.0)
HCT: 32.2 % — ABNORMAL LOW (ref 36.0–46.0)
Hemoglobin: 11.5 g/dL — ABNORMAL LOW (ref 12.0–15.0)
Hemoglobin: 11.2 g/dL — ABNORMAL LOW (ref 12.0–15.0)
MCH: 28.8 pg (ref 26.0–34.0)
MCH: 28.4 pg (ref 26.0–34.0)
MCHC: 35.1 g/dL (ref 30.0–36.0)
MCHC: 34.8 g/dL (ref 30.0–36.0)
MCV: 82.2 fL (ref 78.0–100.0)
MCV: 81.5 fL (ref 78.0–100.0)
Platelets: 63 10*3/uL — ABNORMAL LOW (ref 150–400)
Platelets: 59 10*3/uL — ABNORMAL LOW (ref 150–400)
RBC: 3.99 MIL/uL (ref 3.87–5.11)
RBC: 3.95 MIL/uL (ref 3.87–5.11)
RDW: 15.3 % (ref 11.5–15.5)
RDW: 15.2 % (ref 11.5–15.5)
WBC: 24.9 10*3/uL — ABNORMAL HIGH (ref 4.0–10.5)
WBC: 21.4 10*3/uL — ABNORMAL HIGH (ref 4.0–10.5)

## 2012-04-07 LAB — COMPREHENSIVE METABOLIC PANEL
ALT: 145 U/L — ABNORMAL HIGH (ref 0–35)
AST: 98 U/L — ABNORMAL HIGH (ref 0–37)
Albumin: 1.8 g/dL — ABNORMAL LOW (ref 3.5–5.2)
Alkaline Phosphatase: 288 U/L — ABNORMAL HIGH (ref 39–117)
BUN: 64 mg/dL — ABNORMAL HIGH (ref 6–23)
CO2: 19 mEq/L (ref 19–32)
Calcium: 8.1 mg/dL — ABNORMAL LOW (ref 8.4–10.5)
Chloride: 108 mEq/L (ref 96–112)
Creatinine, Ser: 2.61 mg/dL — ABNORMAL HIGH (ref 0.50–1.10)
GFR calc Af Amer: 21 mL/min — ABNORMAL LOW (ref >90)
GFR calc non Af Amer: 18 mL/min — ABNORMAL LOW (ref >90)
Glucose, Bld: 112 mg/dL — ABNORMAL HIGH (ref 70–99)
Potassium: 3.1 mEq/L — ABNORMAL LOW (ref 3.5–5.1)
Sodium: 140 mEq/L (ref 135–145)
Total Bilirubin: 0.4 mg/dL (ref 0.3–1.2)
Total Protein: 5 g/dL — ABNORMAL LOW (ref 6.0–8.3)

## 2012-04-07 LAB — BASIC METABOLIC PANEL
BUN: 64 mg/dL — ABNORMAL HIGH (ref 6–23)
CO2: 21 mEq/L (ref 19–32)
Calcium: 8 mg/dL — ABNORMAL LOW (ref 8.4–10.5)
Chloride: 107 mEq/L (ref 96–112)
Creatinine, Ser: 2.59 mg/dL — ABNORMAL HIGH (ref 0.50–1.10)
GFR calc Af Amer: 21 mL/min — ABNORMAL LOW (ref >90)
GFR calc non Af Amer: 18 mL/min — ABNORMAL LOW (ref >90)
Glucose, Bld: 113 mg/dL — ABNORMAL HIGH (ref 70–99)
Potassium: 3.1 mEq/L — ABNORMAL LOW (ref 3.5–5.1)
Sodium: 138 mEq/L (ref 135–145)

## 2012-04-07 LAB — GRAM STAIN

## 2012-04-07 LAB — HEPARIN LEVEL (UNFRACTIONATED)
Heparin Unfractionated: <0.1 IU/mL — ABNORMAL LOW (ref 0.30–0.70)
Heparin Unfractionated: 0.12 IU/mL — ABNORMAL LOW (ref 0.30–0.70)
Heparin Unfractionated: 0.32 IU/mL (ref 0.30–0.70)

## 2012-04-07 LAB — PROTIME-INR
INR: 1.18 (ref 0.00–1.49)
Prothrombin Time: 14.8 seconds (ref 11.6–15.2)

## 2012-04-07 LAB — STREP PNEUMONIAE URINARY ANTIGEN: Strep Pneumo Urinary Antigen: NEGATIVE

## 2012-04-07 MED ORDER — FUROSEMIDE 10 MG/ML IJ SOLN: 40.0000 mg | Freq: Once | INTRAMUSCULAR | Status: DC

## 2012-04-07 MED ORDER — POTASSIUM CHLORIDE 20 MEQ/15ML (10%) PO LIQD
40.0000 meq | Freq: Once | ORAL | Status: AC
  Administered 2012-04-07: 40 meq
  Filled 2012-04-07: qty 30

## 2012-04-07 MED ORDER — HEPARIN (PORCINE) IN NACL 100-0.45 UNIT/ML-% IJ SOLN
2200.0000 [IU]/h | INTRAMUSCULAR | Status: DC
  Administered 2012-04-07: 2200 [IU]/h via INTRAVENOUS
  Filled 2012-04-07 (×3): qty 250

## 2012-04-07 MED ORDER — FUROSEMIDE 10 MG/ML IJ SOLN
INTRAMUSCULAR | Status: AC
  Administered 2012-04-07: 40 mg
  Filled 2012-04-07: qty 4

## 2012-04-07 NOTE — Progress Notes (Signed)
Hypokalemia   K replaced  

## 2012-04-07 NOTE — Progress Notes (Signed)
ANTICOAGULATION CONSULT NOTE -Follow Up Consult  Pharmacy Consult for Heparin Indication: Atrial fibrillation  No Known Allergies  Patient Measurements: Height: 5\' 4"  (162.6 cm) Weight: 251 lb 15.8 oz (114.3 kg) IBW/kg (Calculated) : 54.7  Heparin Dosing Weight: 82 kg  Vital Signs: Temp: 98.5 F (36.9 C) (10/19 0414) Temp src: Oral (10/19 0414) BP: 83/48 mmHg (10/19 0300) Pulse Rate: 88  (10/19 0300)  Labs:  Basename 04/07/12 0410 04/06/12 1930 04/06/12 1618 04/06/12 1144 04/06/12 0409 04/05/12 1216 04/05/12 0035 04/04/12 1807 04/04/12 1230  HGB 11.5* -- 11.5* -- -- -- -- -- --  HCT 32.8* -- 33.1* -- 34.3* -- -- -- --  PLT 63* -- 64* -- 84* -- -- -- --  APTT 50* -- -- -- -- 47* -- -- --  LABPROT 14.8 15.2 -- -- -- 17.5* -- -- --  INR 1.18 1.22 -- -- -- 1.48 -- -- --  HEPARINUNFRC <0.10* <0.10* -- -- -- -- -- -- --  CREATININE 2.61* -- -- 2.70* 2.77* -- -- -- --  CKTOTAL -- -- -- -- -- -- -- -- --  CKMB -- -- -- -- -- -- -- -- --  TROPONINI -- -- -- -- -- -- 1.49* 1.49* 1.12*    Estimated Creatinine Clearance: 25.9 ml/min (by C-G formula based on Cr of 2.61).   Medical History: Past Medical History  Diagnosis Date  . Hypertension   . Pneumonia   . GERD (gastroesophageal reflux disease)   . Headache     Assessment: 66 yo female with atrial fibrillation to be started on heparin. H/H stable, platelets low and stable from yesterday. Heparin level is below-goal on 1600 units/hr.   Goal of Therapy:  Heparin 0.3-0.7 Monitor platelets by anticoagulation protocol: Yes   Plan:  1. Increase heparin to 1900 units/hr 2. Heparin level in 8 hours.   Raye Sorrow, PharmD 04/07/2012 6:53 AM

## 2012-04-07 NOTE — Progress Notes (Signed)
ANTICOAGULATION CONSULT NOTE -Follow Up Consult Pharmacy Consult for Heparin Indication: Atrial fibrillation  No Known Allergies  Patient Measurements: Height: 5\' 4"  (162.6 cm) Weight: 251 lb 15.8 oz (114.3 kg) IBW/kg (Calculated) : 54.7  Heparin Dosing Weight: 82 kg  Vital Signs: Temp: 98.4 F (36.9 C) (10/19 2010) Temp src: Oral (10/19 2010) BP: 94/62 mmHg (10/19 1800) Pulse Rate: 86  (10/19 2330)  Labs:  Basename 04/07/12 2322 04/07/12 1450 04/07/12 0410 04/06/12 1930 04/06/12 1618 04/06/12 1144 04/05/12 1216 04/05/12 0035  HGB -- 11.2* 11.5* -- -- -- -- --  HCT -- 32.2* 32.8* -- 33.1* -- -- --  PLT -- 59* 63* -- 64* -- -- --  APTT -- -- 50* -- -- -- 47* --  LABPROT -- -- 14.8 15.2 -- -- 17.5* --  INR -- -- 1.18 1.22 -- -- 1.48 --  HEPARINUNFRC 0.32 0.12* <0.10* -- -- -- -- --  CREATININE -- 2.59* 2.61* -- -- 2.70* -- --  CKTOTAL -- -- -- -- -- -- -- --  CKMB -- -- -- -- -- -- -- --  TROPONINI -- -- -- -- -- -- -- 1.49*    Estimated Creatinine Clearance: 26.1 ml/min (by C-G formula based on Cr of 2.59).  Assessment: 66 yo female with atrial fibrillation for  Heparin  Goal of Therapy:  Heparin 0.3-0.7 Monitor platelets by anticoagulation protocol: Yes   Plan:  Continue Heparin at current rate Follow-up am labs.  Phillis Knack, PharmD, BCPS

## 2012-04-07 NOTE — Progress Notes (Signed)
ANTICOAGULATION CONSULT NOTE -Follow Up Consult  Pharmacy Consult for Heparin Indication: Atrial fibrillation  No Known Allergies  Patient Measurements: Height: 5\' 4"  (162.6 cm) Weight: 251 lb 15.8 oz (114.3 kg) IBW/kg (Calculated) : 54.7  Heparin Dosing Weight: 82 kg  Vital Signs: Temp: 97.8 F (36.6 C) (10/19 1228) Temp src: Oral (10/19 1228) BP: 97/46 mmHg (10/19 1500) Pulse Rate: 91  (10/19 1500)  Labs:  Basename 04/07/12 1450 04/07/12 0410 04/06/12 1930 04/06/12 1618 04/06/12 1144 04/06/12 0409 04/05/12 1216 04/05/12 0035 04/04/12 1807  HGB 11.2* 11.5* -- -- -- -- -- -- --  HCT 32.2* 32.8* -- 33.1* -- -- -- -- --  PLT 59* 63* -- 64* -- -- -- -- --  APTT -- 50* -- -- -- -- 47* -- --  LABPROT -- 14.8 15.2 -- -- -- 17.5* -- --  INR -- 1.18 1.22 -- -- -- 1.48 -- --  HEPARINUNFRC 0.12* <0.10* <0.10* -- -- -- -- -- --  CREATININE -- 2.61* -- -- 2.70* 2.77* -- -- --  CKTOTAL -- -- -- -- -- -- -- -- --  CKMB -- -- -- -- -- -- -- -- --  TROPONINI -- -- -- -- -- -- -- 1.49* 1.49*    Estimated Creatinine Clearance: 25.9 ml/min (by C-G formula based on Cr of 2.61).   Medical History: Past Medical History  Diagnosis Date  . Hypertension   . Pneumonia   . GERD (gastroesophageal reflux disease)   . Headache     Assessment: 66 yo female with atrial fibrillation to be started on heparin. H/H stable, platelets low and stable from yesterday. Level is still subtherapeutic this PM.   Goal of Therapy:  Heparin 0.3-0.7 Monitor platelets by anticoagulation protocol: Yes   Plan:  1. Increase heparin to 2200 units/hr 2. Check 8hr heparin level

## 2012-04-07 NOTE — Progress Notes (Signed)
Name: [name] MRN: [id number] DOB: 1945-09-01    LOS: 4  Referring Provider:  Zenia Resides EDP Reason for Referral:  Septic shock  PULMONARY / CRITICAL CARE MEDICINE  Brief patient description:  This is a 66 y/o female admitted on 10/16 to [facility] with septic shock likely from pyelonephritis.  DDx includes an obstructing kidney stone given her history of nephrolithiasis as well as (less likely) severe CAP given hypoxemia and crackles in left base.  Events Since Admission: 10/15 CT Ab/pelv (stone protocol) >> 10/15: ETT>>> 10/16: Cystoscopy, Left Retrograde Pyelogram, with interpretation, and Left JJ stent ( 30F x 24 cm). 10/17- refractory shock, acidosis, fib, new dx septic cardiomyopathy likely  Current Status: A-fib on amiodarone rate controled. Not requiring pressors since 10/18 pm.   Vital Signs: Temp:  [98.3 F (36.8 C)-100.4 F (38 C)] 98.5 F (36.9 C) (10/19 0414) Pulse Rate:  [44-121] 88  (10/19 0300) Resp:  [0-39] 38  (10/19 0300) BP: (83-124)/(48-85) 83/48 mmHg (10/19 0300) SpO2:  [85 %-100 %] 85 % (10/19 0300) Arterial Line BP: (95-135)/(52-70) 115/64 mmHg (10/19 0300) FiO2 (%):  [40 %-50 %] 40 % (10/19 0327) Weight:  [251 lb 15.8 oz (114.3 kg)] 251 lb 15.8 oz (114.3 kg) (10/19 0400)  Physical Examination: Gen: intubated, sedated HEENT: NCAT, MM dry, PERRL, EOMi PULM: coarse breath sounds CV: Tachy, irregular, distant heart sounds AB: BS+, soft, nontender, no masses, no guarding or rebound Ext: warm feet compared to yesterday. Blue discoloration present in feet and fingers.  improving Neuro: sedated, doesn't follow commands Principal Problem:  *Septic shock Active Problems:  Pyelonephritis  Hypoxemia  Acute encephalopathy  Nephrolithiasis  AKI (acute kidney injury)   ASSESSMENT AND PLAN  PULMONARY  Lab 04/06/12 1404 04/06/12 0429 04/05/12 1532 04/05/12 1210 04/05/12 0432 04/04/12 2051  PHART 7.381 7.391 7.388 -- 7.345* 7.278*  PCO2ART 30.4* 27.2* 26.6*  -- 29.5* 30.5*  PO2ART 104.0* 111.0* 121.0* -- 149.0* 150.0*  HCO3 17.9* 16.5* 15.7* -- 16.1* 14.3*  O2SAT 98.0 98.0 99.1 78.3 99.0 --   Ventilator Settings: Vent Mode:  [-] PRVC FiO2 (%):  [40 %-50 %] 40 % Set Rate:  [24 bmp-30 bmp] 24 bmp Vt Set:  [470 mL] 470 mL PEEP:  [5 cmH20] 5 cmH20 Plateau Pressure:  [18 cmH20-24 cmH20] 24 cmH20 CXR:  Cardiomegally, bibasilar atelectasis 10/18: increased atelectasis vs infiltrate in mid and lower left lobe - layering effusion? ETT:  10/15  A: Acute respiratory failure, uncompensated met acidosis improved Likely a degree of ALI (ratio meets 268 ALI at best now), cvp was up as well Left effusion likely P:   - currently on PRVC, 40% FiO2, 5 PEEP - pcxr: pulmonary edema present - no weaning trial today - pcxr in am  CARDIOVASCULAR  Lab 04/05/12 0437 04/05/12 0035 04/04/12 1807 04/04/12 1805 04/04/12 1230 04/04/12 0428 04/04/12 0210 04/03/12 2222  TROPONINI -- 1.49* 1.49* -- 1.12* 0.44* <0.30 --  LATICACIDVEN 3.1* -- -- 3.8* 3.0* -- -- 5.0*  PROBNP -- -- -- -- -- -- 8289.0* --   ECG:  Sinus tachy, nonspecific ST wave changes in lateral precordial leads 10/18: a-fib at 95bpm Lines: 10/15 R IJ (EDP) >>  A: Septic shock: last dose norepi: 10/18 Echo: EF: 20-25%. Septic cardiomyopathy likely  NSTEMI r/o, unlikely New onset a-fib 10/17: loaded with amio, now on amio gtt and heparin  P:  -see ID - MAP:60-80: off of pressors -Sepsis protocol completed -Asa hold, drop in plat -will need echo in 10 days -  continue amio for a-fib, started on hep IV 10/18 given CHADS: 3. Will need to monitor platelet and risk of bleeding on heparin - lasix 40mg  IV x1   RENAL  Lab 04/07/12 0410 04/06/12 1144 04/06/12 0409 04/05/12 2009 04/05/12 1217 04/05/12 1216 04/04/12 0428 04/04/12 0210  NA 140 135 133* 132* 132* -- -- --  K 3.1* 3.4* -- -- -- -- -- --  CL 108 103 104 103 101 -- -- --  CO2 19 18* 16* 15* 18* -- -- --  BUN 64* 59* 57* 52* 44* -- --  --  CREATININE 2.61* 2.70* 2.77* 2.73* 2.74* -- -- --  CALCIUM 8.1* 8.1* 8.0* 7.7* 7.3* -- -- --  MG -- -- 2.3 -- -- 2.1 1.2* 1.0*  PHOS -- -- -- -- -- -- 4.2 2.2*   Intake/Output      10/18 0701 - 10/19 0700   I.V. (mL/kg) 1008.1 (8.8)   NG/GT 670   IV Piggyback 100   Total Intake(mL/kg) 1778.1 (15.6)   Urine (mL/kg/hr) 1450 (0.5)   Stool 400   Total Output 1850   Net -71.9       Stool Occurrence 2 x    Foley:  10/15 >>  A:  ATN from shock, increased urine output 10/19: possibly from post ATN diuresis History of nephrolithiasis now with L pyelonephritis: CT: impacted 24mm left upper ureteral stone at [facility].  - left JJ stent placed.  - no evidence of hydronephrosis on repeat US 10/16, perc drain held  P:   - Plateau in creatinine with mildly increase in urine output - lasix 40mg  IV once for fluid mobilization.  - BMP in pm - repeat renal US - hypokalemia: KCl per tube 71meq x1  GASTROINTESTINAL  Lab 04/07/12 0410 04/05/12 0436 04/04/12 0428 04/03/12 2221  AST 98* 231* 127* 104*  ALT 145* 247* 80* 71*  ALKPHOS 288* 143* 209* 169*  BILITOT 0.4 0.7 0.9 0.9  PROT 5.0* 4.6* 5.2* 5.1*  ALBUMIN 1.8* 1.9* 2.4* 2.5*    A:  Elevated LFT's, likely from shock liver: improving P:  -LFT's improving - repeat CMP in am - tube feeds continue ppi  HEMATOLOGIC  Lab 04/07/12 0410 04/06/12 1930 04/06/12 1618 04/06/12 0409 04/05/12 1216 04/05/12 0435  HGB 11.5* -- 11.5* 11.9* 11.5* 11.4*  HCT 32.8* -- 33.1* 34.3* 33.5* 33.5*  PLT 63* -- 64* 84* 71* 72*  INR 1.18 1.22 -- -- 1.48 --  APTT 50* -- -- -- 47* --   A:  Bleeding from urine-resolving Thrombocytopenia, likely from E-coli urosepsis P:  -monitor platelets on heparin as they are trending down. If worsening drop and evidence of bleeding, stop heparin.   INFECTIOUS  Lab 04/07/12 0410 04/06/12 1618 04/06/12 0409 04/05/12 1216 04/05/12 0435 04/04/12 0210  WBC 24.9* 29.9* 31.8* 26.0* 24.7* --  PROCALCITON -- -- -- -- --  82.91   Cultures: 10/15 Urine >>e-coli pansensitive 10/15 Blood >>e-coli, pansensitive.  10/15 Urine st/leg ag>> 10/18: blood>> UA: 10/18: large leuks, large nitrites.    Antibiotics: 10/15 zosyn x1  10/15 vanc (severe cap) >>DC 10/15 ceftriaxone (pyelo, severe CAP) >>10/16 10/15 azithro (severe cap) >>10/16 10/16: levofloxacin >>>10/18 10/16: ceftaz>>>10/18  10/18 ceftriaxone (urine and blood cult pos ecoli)>>>  Lactic acid: 5 Elevated WBC  A:  Septic shock: E-coli bacteremia from pyelonephritis with obstructive uropathy;  Rising wbc, febrile x1: 100.4 P:   -cont  ceftraixone. Fever curve improving. Still elevated WBC. Continue to monitor for now - f/u repeat  BC - collect urine strep and legionella  ENDOCRINE  Lab 04/07/12 0408 04/06/12 2353 04/06/12 1925 04/06/12 1605 04/06/12 1135  GLUCAP 95 100* 95 90 140*   A:  No evidence rel AI P:   -monitor glucose, controlled -tsh wnl -No role steroids  NEUROLOGIC  A:  Acute encephalopathy from sepsis, improving with resuscitation and antibiotics P:   -required one dose of versed overnight  -treat pain carefully with fentanyl  BEST PRACTICE / DISPOSITION Level of Care:  ICU Primary Service:  PCCM Consultants:  Urology: signed out 10/17 Code Status:  full Diet:  TF DVT Px:  Scd, hep IV fib 10/18>>> GI Px:  pepcid Skin Integrity:  normal Social / Family:  Updated daughter at bedside daily  CC time 30 minutes    04/07/2012, 5:59 AM  Liam Graham, PGY-2 [facility] Rotation  I have seen and examined this pt with resident and agree with the above note with edits. Mariel Sleet  Beeper  [phone]  Cell  [phone]  If no response or cell goes to voicemail, call beeper [phone]

## 2012-04-08 ENCOUNTER — Inpatient Hospital Stay (HOSPITAL_COMMUNITY): Payer: Medicare Other

## 2012-04-08 DIAGNOSIS — R0989 Other specified symptoms and signs involving the circulatory and respiratory systems: Secondary | ICD-10-CM

## 2012-04-08 LAB — COMPREHENSIVE METABOLIC PANEL
ALT: 108 U/L — ABNORMAL HIGH (ref 0–35)
AST: 77 U/L — ABNORMAL HIGH (ref 0–37)
Albumin: 1.6 g/dL — ABNORMAL LOW (ref 3.5–5.2)
Alkaline Phosphatase: 255 U/L — ABNORMAL HIGH (ref 39–117)
BUN: 67 mg/dL — ABNORMAL HIGH (ref 6–23)
CO2: 21 mEq/L (ref 19–32)
Calcium: 8.1 mg/dL — ABNORMAL LOW (ref 8.4–10.5)
Chloride: 110 mEq/L (ref 96–112)
Creatinine, Ser: 2.47 mg/dL — ABNORMAL HIGH (ref 0.50–1.10)
GFR calc Af Amer: 22 mL/min — ABNORMAL LOW (ref >90)
GFR calc non Af Amer: 19 mL/min — ABNORMAL LOW (ref >90)
Glucose, Bld: 120 mg/dL — ABNORMAL HIGH (ref 70–99)
Potassium: 3.5 mEq/L (ref 3.5–5.1)
Sodium: 141 mEq/L (ref 135–145)
Total Bilirubin: 0.2 mg/dL — ABNORMAL LOW (ref 0.3–1.2)
Total Protein: 4.8 g/dL — ABNORMAL LOW (ref 6.0–8.3)

## 2012-04-08 LAB — PROTIME-INR
INR: 1.19 (ref 0.00–1.49)
Prothrombin Time: 14.9 seconds (ref 11.6–15.2)

## 2012-04-08 LAB — CBC
HCT: 32.4 % — ABNORMAL LOW (ref 36.0–46.0)
Hemoglobin: 11.1 g/dL — ABNORMAL LOW (ref 12.0–15.0)
MCH: 28.2 pg (ref 26.0–34.0)
MCHC: 34.3 g/dL (ref 30.0–36.0)
MCV: 82.2 fL (ref 78.0–100.0)
Platelets: 67 10*3/uL — ABNORMAL LOW (ref 150–400)
RBC: 3.94 MIL/uL (ref 3.87–5.11)
RDW: 15.5 % (ref 11.5–15.5)
WBC: 22.7 10*3/uL — ABNORMAL HIGH (ref 4.0–10.5)

## 2012-04-08 LAB — GLUCOSE, CAPILLARY
Glucose-Capillary: 108 mg/dL — ABNORMAL HIGH (ref 70–99)
Glucose-Capillary: 111 mg/dL — ABNORMAL HIGH (ref 70–99)
Glucose-Capillary: 117 mg/dL — ABNORMAL HIGH (ref 70–99)
Glucose-Capillary: 132 mg/dL — ABNORMAL HIGH (ref 70–99)
Glucose-Capillary: 63 mg/dL — ABNORMAL LOW (ref 70–99)
Glucose-Capillary: 67 mg/dL — ABNORMAL LOW (ref 70–99)
Glucose-Capillary: 72 mg/dL (ref 70–99)
Glucose-Capillary: 81 mg/dL (ref 70–99)

## 2012-04-08 LAB — APTT: aPTT: 90 seconds — ABNORMAL HIGH (ref 24–37)

## 2012-04-08 LAB — LEGIONELLA ANTIGEN, URINE: Legionella Antigen, Urine: NEGATIVE

## 2012-04-08 LAB — HEPARIN LEVEL (UNFRACTIONATED): Heparin Unfractionated: 0.31 IU/mL (ref 0.30–0.70)

## 2012-04-08 MED ORDER — DEXTROSE 50 % IV SOLN
INTRAVENOUS | Status: AC
  Administered 2012-04-08: 25 mL
  Filled 2012-04-08: qty 50

## 2012-04-08 MED ORDER — STUDY - INVESTIGATIONAL DRUG SIMPLE RECORD (ML)
0.5000 mL | Freq: Once | Status: AC
  Administered 2012-04-08: 0.5 mL via INTRAVENOUS
  Filled 2012-04-08: qty 0.5

## 2012-04-08 MED ORDER — PROPOFOL 10 MG/ML IV EMUL
INTRAVENOUS | Status: AC
  Filled 2012-04-08: qty 100

## 2012-04-08 MED ORDER — PROPOFOL 10 MG/ML IV EMUL
5.0000 ug/kg/min | INTRAVENOUS | Status: DC
  Administered 2012-04-08: 15 ug/kg/min via INTRAVENOUS
  Administered 2012-04-08: 20 ug/kg/min via INTRAVENOUS
  Administered 2012-04-08: 15 ug/kg/min via INTRAVENOUS
  Administered 2012-04-09: 40 ug/kg/min via INTRAVENOUS
  Administered 2012-04-09: 10 ug/kg/min via INTRAVENOUS
  Administered 2012-04-09: 30 ug/kg/min via INTRAVENOUS
  Administered 2012-04-09: 20 ug/kg/min via INTRAVENOUS
  Administered 2012-04-09: 15 ug/kg/min via INTRAVENOUS
  Administered 2012-04-09 (×3): 20 ug/kg/min via INTRAVENOUS
  Administered 2012-04-10 (×3): 40 ug/kg/min via INTRAVENOUS
  Administered 2012-04-10: 20 ug/kg/min via INTRAVENOUS
  Administered 2012-04-10: 40 ug/kg/min via INTRAVENOUS
  Administered 2012-04-10: 20 ug/kg/min via INTRAVENOUS
  Administered 2012-04-10: 40 ug/kg/min via INTRAVENOUS
  Administered 2012-04-10: 20 ug/kg/min via INTRAVENOUS
  Administered 2012-04-10 – 2012-04-11 (×2): 40 ug/kg/min via INTRAVENOUS
  Filled 2012-04-08: qty 300
  Filled 2012-04-08 (×2): qty 100
  Filled 2012-04-08 (×2): qty 200
  Filled 2012-04-08 (×6): qty 100

## 2012-04-08 MED ORDER — RANITIDINE HCL 150 MG/10ML PO SYRP
150.0000 mg | ORAL_SOLUTION | Freq: Every day | ORAL | Status: DC
  Administered 2012-04-08 – 2012-04-10 (×4): 150 mg
  Filled 2012-04-08 (×5): qty 10

## 2012-04-08 MED ORDER — STUDY - INVESTIGATIONAL DRUG SIMPLE RECORD (ML)
1.0000 mL | Freq: Two times a day (BID) | Status: DC
  Administered 2012-04-08 – 2012-04-09 (×2): 1 mL via INTRAVENOUS
  Filled 2012-04-08: qty 1

## 2012-04-08 MED ORDER — HEPARIN (PORCINE) IN NACL 100-0.45 UNIT/ML-% IJ SOLN
1900.0000 [IU]/h | INTRAMUSCULAR | Status: DC
  Administered 2012-04-08: 2300 [IU]/h via INTRAVENOUS
  Administered 2012-04-09: 2200 [IU]/h via INTRAVENOUS
  Administered 2012-04-09: 2300 [IU]/h via INTRAVENOUS
  Administered 2012-04-10: 2200 [IU]/h via INTRAVENOUS
  Administered 2012-04-11: 2100 [IU]/h via INTRAVENOUS
  Filled 2012-04-08 (×8): qty 250

## 2012-04-08 MED ORDER — FUROSEMIDE 10 MG/ML IJ SOLN
40.0000 mg | Freq: Once | INTRAMUSCULAR | Status: AC
  Administered 2012-04-08: 40 mg via INTRAVENOUS
  Filled 2012-04-08: qty 4

## 2012-04-08 NOTE — ED Provider Notes (Signed)
I saw and evaluated the patient, reviewed the resident's note and I agree with the findings and plan.  Leota Jacobsen, MD 04/08/12 [phone]

## 2012-04-08 NOTE — Progress Notes (Signed)
Hypoglycemic Event  CBG: 66  Treatment: D50 IV 25 mL  Symptoms: None  Follow-up CBG: Time:2128 CBG Result:132  Possible Reasons for Event: Unknown  Comments/MD notified:Dr Belia Heman, [name] O  Remember to initiate Hypoglycemia Order Set & complete

## 2012-04-08 NOTE — Progress Notes (Signed)
Name: [name] MRN: [id number] DOB: 1945-08-18    LOS: 5  Referring Provider:  Zenia Resides EDP Reason for Referral:  Septic shock  PULMONARY / CRITICAL CARE MEDICINE  Brief patient description:  This is a 66 y/o female admitted on 10/16 to [facility] with septic shock likely from pyelonephritis.  DDx includes an obstructing kidney stone given her history of nephrolithiasis as well as (less likely) severe CAP given hypoxemia and crackles in left base.  Events Since Admission: 10/15 CT Ab/pelv (stone protocol) >> 10/15: ETT>>> 10/16: Cystoscopy, Left Retrograde Pyelogram, with interpretation, and Left JJ stent ( 92F x 24 cm). 10/17- refractory shock, acidosis, fib, new dx septic cardiomyopathy likely 10/19 renal u/s>>no hydroneph 10/20 CT head>>  Current Status: On amiodarone.  Off vasopressors.   Not waking up   Vital Signs: Temp:  [97.8 F (36.6 C)-98.4 F (36.9 C)] 98.2 F (36.8 C) (10/20 0745) Pulse Rate:  [68-112] 85  (10/20 0755) Resp:  [10-27] 20  (10/20 0755) BP: (85-140)/(42-88) 92/60 mmHg (10/20 0600) SpO2:  [92 %-100 %] 100 % (10/20 0755) Arterial Line BP: (100-147)/(51-75) 112/59 mmHg (10/20 0600) FiO2 (%):  [40 %] 40 % (10/20 0755) Weight:  [111.7 kg (246 lb 4.1 oz)] 111.7 kg (246 lb 4.1 oz) (10/20 0438)  Physical Examination: Gen: intubated, sedated HEENT: NCAT, MM dry, PERRL, EOMi PULM: coarse breath sounds CV: Tachy, irregular, distant heart sounds AB: BS+, soft, nontender, no masses, no guarding or rebound Ext: warm feet compared to yesterday. Blue discoloration present in feet and fingers.  improving Neuro: wont f/c  RUE seems weak.  Encephalopathic   Principal Problem:  *Septic shock Active Problems:  Pyelonephritis  Hypoxemia  Acute encephalopathy  Nephrolithiasis  AKI (acute kidney injury)   ASSESSMENT AND PLAN  PULMONARY  Lab 04/06/12 1404 04/06/12 0429 04/05/12 1532 04/05/12 1210 04/05/12 0432 04/04/12 2051  PHART 7.381 7.391 7.388 -- 7.345*  7.278*  PCO2ART 30.4* 27.2* 26.6* -- 29.5* 30.5*  PO2ART 104.0* 111.0* 121.0* -- 149.0* 150.0*  HCO3 17.9* 16.5* 15.7* -- 16.1* 14.3*  O2SAT 98.0 98.0 99.1 78.3 99.0 --   Ventilator Settings: Vent Mode:  [-] CPAP;PSV FiO2 (%):  [40 %] 40 % Set Rate:  [24 bmp] 24 bmp Vt Set:  [470 mL] 470 mL PEEP:  [5 cmH20] 5 cmH20 Pressure Support:  [8 cmH20] 8 cmH20 Plateau Pressure:  [17 cmH20-20 cmH20] 20 cmH20 CXR:  Cardiomegally, bibasilar atelectasis, less edema ETT:  10/15  A: Acute respiratory failure Likely a degree of ALI (ratio meets 268 ALI at best now), cvp was up as well Left effusion likely  P:   - currently on PRVC, 40% FiO2, 5 PEEP -cycle psv today No extubation d/t mental status which is rate limit step Give more lasix-  CARDIOVASCULAR  Lab 04/05/12 0437 04/05/12 0035 04/04/12 1807 04/04/12 1805 04/04/12 1230 04/04/12 0428 04/04/12 0210 04/03/12 2222  TROPONINI -- 1.49* 1.49* -- 1.12* 0.44* <0.30 --  LATICACIDVEN 3.1* -- -- 3.8* 3.0* -- -- 5.0*  PROBNP -- -- -- -- -- -- 8289.0* --   ECG:  None Lines: 10/15 R IJ (EDP) >>  A: Septic shock: last dose norepi: 10/18 Echo: EF: 20-25%. Septic cardiomyopathy likely  NSTEMI r/o, unlikely New onset a-fib 10/17: loaded with amio, now on amio gtt and heparin  P:  -cont lasix -cont amiod drip -cont hep drip  RENAL  Lab 04/08/12 0452 04/07/12 1450 04/07/12 0410 04/06/12 1144 04/06/12 0409 04/05/12 1216 04/04/12 0428 04/04/12 0210  NA 141  138 140 135 133* -- -- --  K 3.5 3.1* -- -- -- -- -- --  CL 110 107 108 103 104 -- -- --  CO2 21 21 19  18* 16* -- -- --  BUN 67* 64* 64* 59* 57* -- -- --  CREATININE 2.47* 2.59* 2.61* 2.70* 2.77* -- -- --  CALCIUM 8.1* 8.0* 8.1* 8.1* 8.0* -- -- --  MG -- -- -- -- 2.3 2.1 1.2* 1.0*  PHOS -- -- -- -- -- -- 4.2 2.2*   Intake/Output      10/19 0701 - 10/20 0700 10/20 0701 - 10/21 0700   I.V. (mL/kg) 1354.8 (12.1)    NG/GT 600    IV Piggyback 100    Total Intake(mL/kg) 2054.8 (18.4)     Urine (mL/kg/hr) 3065 (1.1)    Stool 1375    Total Output 4440    Net -2385.2          Foley:  10/15 >>  A:  ATN from shock, increased urine output 10/19: possibly from post ATN diuresis History of nephrolithiasis now with L pyelonephritis: CT: impacted 34mm left upper ureteral stone at [facility].  - left JJ stent placed.  - no evidence of hydronephrosis on repeat US 10/16, perc drain held, repeat renal u/s 10/19: no hydronephrosis  P:   - Plateau in creatinine with mildly increase in urine output - lasix 40mg  IV once for fluid mobilization.  - BMP in AM  -GASTROINTESTINAL  Lab 04/08/12 0452 04/07/12 0410 04/05/12 0436 04/04/12 0428 04/03/12 2221  AST 77* 98* 231* 127* 104*  ALT 108* 145* 247* 80* 71*  ALKPHOS 255* 288* 143* 209* 169*  BILITOT 0.2* 0.4 0.7 0.9 0.9  PROT 4.8* 5.0* 4.6* 5.2* 5.1*  ALBUMIN 1.6* 1.8* 1.9* 2.4* 2.5*    A:  Elevated LFT's, likely from shock liver: improving P:  -LFT's improving - tube feeds continue ppi  HEMATOLOGIC  Lab 04/08/12 0452 04/07/12 1450 04/07/12 0410 04/06/12 1930 04/06/12 1618 04/06/12 0409 04/05/12 1216  HGB 11.1* 11.2* 11.5* -- 11.5* 11.9* --  HCT 32.4* 32.2* 32.8* -- 33.1* 34.3* --  PLT 67* 59* 63* -- 64* 84* --  INR 1.19 -- 1.18 1.22 -- -- 1.48  APTT 90* -- 50* -- -- -- 47*   A:  Bleeding from urine-resolving Thrombocytopenia, likely from E-coli urosepsis,  Improving. Note extremities are better, with improved perfusion P:  -monitor platelets on heparin as they are trending down. If worsening drop and evidence of bleeding, stop heparin.   INFECTIOUS  Lab 04/08/12 0452 04/07/12 1450 04/07/12 0410 04/06/12 1618 04/06/12 0409 04/04/12 0210  WBC 22.7* 21.4* 24.9* 29.9* 31.8* --  PROCALCITON -- -- -- -- -- 82.91   Cultures: 10/15 Urine >>e-coli pansensitive 10/15 Blood >>e-coli, pansensitive.  10/15 Urine st/leg ag>> 10/18: blood>> UA: 10/18: large leuks, large nitrites.    Antibiotics: 10/15 zosyn x1  10/15 vanc  (severe cap) >>DC 10/15 ceftriaxone (pyelo, severe CAP) >>10/16 10/15 azithro (severe cap) >>10/16 10/16: levofloxacin >>>10/18 10/16: ceftaz>>>10/18  10/18 ceftriaxone (urine and blood cult pos ecoli)>>>  A:  Septic shock: E-coli bacteremia from pyelonephritis with obstructive uropathy; Now improved  WBC and fever curve are better P:   -cont  ceftraixone. Fever curve improving. Still elevated WBC. Continue to monitor for now - f/u repeat [facility]  ENDOCRINE  Lab 04/08/12 0723 04/08/12 0403 04/08/12 0007 04/07/12 2001 04/07/12 1536  GLUCAP 117* 111* 108* 117* 110*   A:  No endocrine issues P:   -monitor  glucose, controlled   NEUROLOGIC  A:  Acute encephalopathy from sepsis, not  improving ?CVA, very agitated this AM  P:   -start propofol drip -CT HEad ?neuro consult   BEST PRACTICE / DISPOSITION Level of Care:  ICU Primary Service:  PCCM Consultants:  Urology: signed out 10/17 Code Status:  full Diet:  TF DVT Px: hep IV fib 10/18>>> GI Px:  Zantac Skin Integrity:  normal Social / Family:  Updated daughter at bedside daily  CC time 30 minutes    04/08/2012, 8:16 AM  Mariel Sleet  Beeper  [phone]  Cell  [phone]  If no response or cell goes to voicemail, call beeper [phone]

## 2012-04-08 NOTE — Progress Notes (Signed)
*  PRELIMINARY RESULTS* Vascular Ultrasound Ankle Brachial Index has been completed.  Bilaterally normal ABI study.    RIGHT    LEFT    PRESSURE WAVEFORM  PRESSURE WAVEFORM  BRACHIAL 99 Tri BRACHIAL 95 Tri  DP   DP    AT 107 Tri AT 114 Tri  PT 122 Tri PT 111 Tri  PER   PER    GREAT TOE  NA GREAT TOE  NA    RIGHT LEFT  ABI 1.23 1.15     Landry Mellow, RDMS, RVT  04/08/2012, 9:30 AM

## 2012-04-08 NOTE — Progress Notes (Signed)
ANTICOAGULATION CONSULT NOTE -Follow Up Consult  Pharmacy Consult for Heparin Indication: Atrial fibrillation  No Known Allergies  Patient Measurements: Height: 5\' 4"  (162.6 cm) Weight: 246 lb 4.1 oz (111.7 kg) IBW/kg (Calculated) : 54.7  Heparin Dosing Weight: 82 kg  Vital Signs: Temp: 98.2 F (36.8 C) (10/20 0745) Temp src: Oral (10/20 0745) BP: 104/77 mmHg (10/20 0800) Pulse Rate: 87  (10/20 0800)  Labs:  Basename 04/08/12 0452 04/07/12 2322 04/07/12 1450 04/07/12 0410 04/06/12 1930 04/05/12 1216  HGB 11.1* -- 11.2* -- -- --  HCT 32.4* -- 32.2* 32.8* -- --  PLT 67* -- 59* 63* -- --  APTT 90* -- -- 50* -- 47*  LABPROT 14.9 -- -- 14.8 15.2 --  INR 1.19 -- -- 1.18 1.22 --  HEPARINUNFRC 0.31 0.32 0.12* -- -- --  CREATININE 2.47* -- 2.59* 2.61* -- --  CKTOTAL -- -- -- -- -- --  CKMB -- -- -- -- -- --  TROPONINI -- -- -- -- -- --    Estimated Creatinine Clearance: 27 ml/min (by C-G formula based on Cr of 2.47).   Medical History: Past Medical History  Diagnosis Date  . Hypertension   . Pneumonia   . GERD (gastroesophageal reflux disease)   . Headache     Assessment: 66 yo female with atrial fibrillation to be started on heparin. H/H stable, platelets low and stable from yesterday. Level therapeutic this AM but on the lower side  Goal of Therapy:  Heparin 0.3-0.7 Monitor platelets by anticoagulation protocol: Yes   Plan:  1. Increase heparin to 2300 units/hr 2. Daily HL

## 2012-04-08 NOTE — Transfer of Care (Signed)
Immediate Anesthesia Transfer of Care Note  Patient: [name]  Procedure(s) Performed: Procedure(s) (LRB) with comments: CYSTOSCOPY WITH RETROGRADE PYELOGRAM/URETERAL STENT PLACEMENT (Left)  Patient Location: ICU  Anesthesia Type: General  Level of Consciousness: sedated  Airway & Oxygen Therapy: Patient remains intubated per anesthesia plan  Post-op Assessment: Report given to PACU RN and Post -op Vital signs reviewed and stable  Post vital signs: Reviewed  Complications: No apparent anesthesia complications

## 2012-04-08 NOTE — Progress Notes (Signed)
[facility] ADULT ICU REPLACEMENT PROTOCOL FOR AM LAB REPLACEMENT ONLY  The patient does not apply for the [facility] Adult ICU Electrolyte Replacment Protocol based on the criteria listed below:   1. Is GFR >/= 50 ml/min? no  Patient's GFR today is [address]  Ferdie Ping McEachran 04/08/2012 6:12 AM

## 2012-04-09 ENCOUNTER — Inpatient Hospital Stay (HOSPITAL_COMMUNITY): Payer: Medicare Other

## 2012-04-09 HISTORY — PX: TRANSTHORACIC ECHOCARDIOGRAM: SHX275

## 2012-04-09 LAB — AMMONIA: Ammonia: 29 umol/L (ref 11–60)

## 2012-04-09 LAB — CLOSTRIDIUM DIFFICILE BY PCR: Toxigenic C. Difficile by PCR: NEGATIVE

## 2012-04-09 LAB — BASIC METABOLIC PANEL
BUN: 68 mg/dL — ABNORMAL HIGH (ref 6–23)
CO2: 23 mEq/L (ref 19–32)
Calcium: 8.3 mg/dL — ABNORMAL LOW (ref 8.4–10.5)
Chloride: 111 mEq/L (ref 96–112)
Creatinine, Ser: 2.28 mg/dL — ABNORMAL HIGH (ref 0.50–1.10)
GFR calc Af Amer: 24 mL/min — ABNORMAL LOW (ref >90)
GFR calc non Af Amer: 21 mL/min — ABNORMAL LOW (ref >90)
Glucose, Bld: 119 mg/dL — ABNORMAL HIGH (ref 70–99)
Potassium: 3.1 mEq/L — ABNORMAL LOW (ref 3.5–5.1)
Sodium: 143 mEq/L (ref 135–145)

## 2012-04-09 LAB — LACTIC ACID, PLASMA: Lactic Acid, Venous: 0.9 mmol/L (ref 0.5–2.2)

## 2012-04-09 LAB — HEPARIN LEVEL (UNFRACTIONATED): Heparin Unfractionated: 0.58 IU/mL (ref 0.30–0.70)

## 2012-04-09 LAB — CBC
HCT: 31.4 % — ABNORMAL LOW (ref 36.0–46.0)
Hemoglobin: 10.9 g/dL — ABNORMAL LOW (ref 12.0–15.0)
MCH: 28.5 pg (ref 26.0–34.0)
MCHC: 34.7 g/dL (ref 30.0–36.0)
MCV: 82.2 fL (ref 78.0–100.0)
Platelets: 84 10*3/uL — ABNORMAL LOW (ref 150–400)
RBC: 3.82 MIL/uL — ABNORMAL LOW (ref 3.87–5.11)
RDW: 15.8 % — ABNORMAL HIGH (ref 11.5–15.5)
WBC: 24.8 10*3/uL — ABNORMAL HIGH (ref 4.0–10.5)

## 2012-04-09 LAB — GLUCOSE, CAPILLARY
Glucose-Capillary: 101 mg/dL — ABNORMAL HIGH (ref 70–99)
Glucose-Capillary: 105 mg/dL — ABNORMAL HIGH (ref 70–99)
Glucose-Capillary: 109 mg/dL — ABNORMAL HIGH (ref 70–99)
Glucose-Capillary: 110 mg/dL — ABNORMAL HIGH (ref 70–99)
Glucose-Capillary: 98 mg/dL (ref 70–99)
Glucose-Capillary: 99 mg/dL (ref 70–99)

## 2012-04-09 MED ORDER — STUDY - INVESTIGATIONAL DRUG SIMPLE RECORD (ML)
1.0000 mL | Freq: Once | Status: AC
  Administered 2012-04-09: 1 mL via INTRAVENOUS

## 2012-04-09 MED ORDER — CLONAZEPAM 1 MG PO TABS
1.0000 mg | ORAL_TABLET | Freq: Three times a day (TID) | ORAL | Status: DC
  Administered 2012-04-09 – 2012-04-11 (×6): 1 mg via ORAL
  Filled 2012-04-09 (×6): qty 1

## 2012-04-09 MED ORDER — POTASSIUM CHLORIDE 20 MEQ/15ML (10%) PO LIQD
ORAL | Status: AC
  Filled 2012-04-09: qty 30

## 2012-04-09 MED ORDER — CLONAZEPAM 0.1 MG/ML ORAL SUSPENSION: 0.5000 mg | Freq: Three times a day (TID) | ORAL | Status: DC | PRN

## 2012-04-09 MED ORDER — STUDY - INVESTIGATIONAL DRUG SIMPLE RECORD (ML)
2.0000 mL | Freq: Two times a day (BID) | Status: DC
  Administered 2012-04-09 – 2012-04-13 (×8): 2 mL via INTRAVENOUS
  Filled 2012-04-09 (×4): qty 2

## 2012-04-09 MED ORDER — AMIODARONE HCL 200 MG PO TABS
400.0000 mg | ORAL_TABLET | Freq: Two times a day (BID) | ORAL | Status: DC
  Administered 2012-04-09 – 2012-04-12 (×6): 400 mg via ORAL
  Filled 2012-04-09 (×8): qty 2

## 2012-04-09 MED ORDER — POTASSIUM CHLORIDE 20 MEQ/15ML (10%) PO LIQD
40.0000 meq | Freq: Once | ORAL | Status: AC
  Administered 2012-04-09: 40 meq via ORAL

## 2012-04-09 MED ORDER — CLONAZEPAM 0.5 MG PO TABS: 0.5000 mg | ORAL_TABLET | Freq: Three times a day (TID) | ORAL | Status: DC | PRN

## 2012-04-09 NOTE — Anesthesia Postprocedure Evaluation (Signed)
  Anesthesia Post-op Note  Patient: [name]  Procedure(s) Performed: Procedure(s) (LRB) with comments: CYSTOSCOPY WITH RETROGRADE PYELOGRAM/URETERAL STENT PLACEMENT (Left)  Patient Location: ICU  Anesthesia Type: General  Level of Consciousness: sedated  Airway and Oxygen Therapy: Pt on ventilator - weaning protocol in place  Post-op Pain: none  Post-op Assessment: Post-op Vital signs reviewed and Patient's Cardiovascular Status Stable  Post-op Vital Signs: Reviewed and stable  Complications: No apparent anesthesia complications

## 2012-04-09 NOTE — Progress Notes (Signed)
[facility] ADULT ICU REPLACEMENT PROTOCOL FOR AM LAB REPLACEMENT ONLY  The patient does not apply for the [facility] Adult ICU Electrolyte Replacment Protocol based on the criteria listed below:   1. Is GFR >/= 50 ml/min? no  Patient's GFR today is 21 6. If a panic level lab has been reported, has the CCM MD in charge been notified? yes.   Physician:  Chari Manning [facility] 04/09/2012 6:23 AM

## 2012-04-09 NOTE — Progress Notes (Signed)
  Echocardiogram 2D Echocardiogram has been performed.  [name] 04/09/2012, 5:38 PM

## 2012-04-09 NOTE — Consult Note (Signed)
Reason for Consult: encephalopathy Referring Physician: Dr. Joya Gaskins  CC: encephalopathy not improving  HPI: [name] is an 67 y.o. female admitted with with septic shock from presumed pyelonephritis with acute encephalopathy/acute delerium. She required intubation after day #1. She Had a 15mm stone obstructing the L-ureter and was taken to OR for JJ stent on same day.    Her urine and blood cx grew out EColi (WBC 22K). She also has new onset afib with likely septic cardiomyopathy. Shock Liver. ATN with increased urine output. Thrombocytopenia improved to 90K.  She remains agitated and has had to remain on propofol. She is also on MIND ([place] trial: haldol vs zirprazidone vs placebo). Klonopin has be scheduled for agitation.  Past Medical History  Diagnosis Date  . Hypertension   . Pneumonia   . GERD (gastroesophageal reflux disease)   . Headache     Past Surgical History  Procedure Date  . Abdominal hysterectomy   . Cholecystectomy     History reviewed. No pertinent family history.  Social History:  reports that she has never smoked. She does not have any smokeless tobacco history on file. She reports that she does not drink alcohol or use illicit drugs.  No Known Allergies  Medications:  Prior to Admission:  Prescriptions prior to admission  Medication Sig Dispense Refill  . bimatoprost (LUMIGAN) 0.01 % SOLN Place 1 drop into both eyes at bedtime.      . hydrochlorothiazide (MICROZIDE) 12.5 MG capsule Take 12.5 mg by mouth daily.      Marland Kitchen lisinopril (PRINIVIL,ZESTRIL) 5 MG tablet Take 5 mg by mouth daily.       Scheduled:   . amiodarone  400 mg Oral BID  . antiseptic oral rinse  15 mL Mouth Rinse QID  . cefTRIAXone (ROCEPHIN)  IV  1 g Intravenous Q24H  . chlorhexidine  15 mL Mouth Rinse BID  . clonazePAM  1 mg Oral Q8H  . dextrose      . feeding supplement (OXEPA)  1,000 mL Per Tube Q24H  . feeding supplement  60 mL Per Tube TID  . MIND-[place] study drug  (PI-Feinstein)   1 mL Intravenous Q12H  . multivitamin  5 mL Per Tube Daily  . potassium chloride  40 mEq Oral Once  . potassium chloride      . propofol      . ranitidine  150 mg Per Tube QHS   Continuous:   . sodium chloride 20 mL/hr (04/08/12 1126)  . heparin 2,300 Units/hr (04/09/12 0018)  . propofol 25 mcg/kg/min (04/09/12 1348)  . DISCONTD: amiodarone (NEXTERONE PREMIX) 360 mg/200 mL dextrose Stopped (04/09/12 1300)    ROS: History prior to admission obtained from chart review, the patient is not able to give history. I have reviewed chart personally. + AMS, fever, flank pain. All other 11 ROS negative from my review.    Physical Examination: Blood pressure 92/44, pulse 77, temperature 97.8 F (36.6 C), temperature source Oral, resp. rate 17, height 5\' 4"  (1.626 m), weight 110.7 kg (244 lb 0.8 oz), SpO2 100.00%.  Neurologic Examination Mental Status: On vent. On propofol.  Cranial Nerves: II: pupils equal, round, pinpoint III,IV, VI: ptosis not present, extra-ocular motions unable to test V,VII: unable to test VIII: she does not respond to my voice IX,X: gag reflex present XI: patient not able to do XII: tongue strength normal  Motor: patient unable to follow commands Sensory: patient unable to communicate Deep Tendon Reflexes: 1+ upper, diminished lower. Plantars:  Right: mute           Left: downgoing Cerebellar: patient is unable to perform  Results for orders placed during the hospital encounter of 04/03/12 (from the past 48 hour(s))  HEPARIN LEVEL (UNFRACTIONATED)     Status: Abnormal   Collection Time   04/07/12  2:50 PM      Component Value Range Comment   Heparin Unfractionated 0.12 (*) 0.30 - 0.70 IU/mL   BASIC METABOLIC PANEL     Status: Abnormal   Collection Time   04/07/12  2:50 PM      Component Value Range Comment   Sodium 138  135 - 145 mEq/L    Potassium 3.1 (*) 3.5 - 5.1 mEq/L    Chloride 107  96 - 112 mEq/L    CO2 21  19 - 32 mEq/L    Glucose, Bld 113 (*)  70 - 99 mg/dL    BUN 64 (*) 6 - 23 mg/dL    Creatinine, Ser 2.59 (*) 0.50 - 1.10 mg/dL    Calcium 8.0 (*) 8.4 - 10.5 mg/dL    GFR calc non Af Amer 18 (*) >90 mL/min    GFR calc Af Amer 21 (*) >90 mL/min   CBC     Status: Abnormal   Collection Time   04/07/12  2:50 PM      Component Value Range Comment   WBC 21.4 (*) 4.0 - 10.5 K/uL    RBC 3.95  3.87 - 5.11 MIL/uL    Hemoglobin 11.2 (*) 12.0 - 15.0 g/dL    HCT 32.2 (*) 36.0 - 46.0 %    MCV 81.5  78.0 - 100.0 fL    MCH 28.4  26.0 - 34.0 pg    MCHC 34.8  30.0 - 36.0 g/dL    RDW 15.2  11.5 - 15.5 %    Platelets 59 (*) 150 - 400 K/uL CONSISTENT WITH PREVIOUS RESULT  GLUCOSE, CAPILLARY     Status: Abnormal   Collection Time   04/07/12  3:36 PM      Component Value Range Comment   Glucose-Capillary 110 (*) 70 - 99 mg/dL   GLUCOSE, CAPILLARY     Status: Abnormal   Collection Time   04/07/12  8:01 PM      Component Value Range Comment   Glucose-Capillary 117 (*) 70 - 99 mg/dL    Comment 1 Documented in Chart      Comment 2 Notify RN     HEPARIN LEVEL (UNFRACTIONATED)     Status: Normal   Collection Time   04/07/12 11:22 PM      Component Value Range Comment   Heparin Unfractionated 0.32  0.30 - 0.70 IU/mL   GLUCOSE, CAPILLARY     Status: Abnormal   Collection Time   04/08/12 12:07 AM      Component Value Range Comment   Glucose-Capillary 108 (*) 70 - 99 mg/dL   GLUCOSE, CAPILLARY     Status: Abnormal   Collection Time   04/08/12  4:03 AM      Component Value Range Comment   Glucose-Capillary 111 (*) 70 - 99 mg/dL   HEPARIN LEVEL (UNFRACTIONATED)     Status: Normal   Collection Time   04/08/12  4:52 AM      Component Value Range Comment   Heparin Unfractionated 0.31  0.30 - 0.70 IU/mL   CBC     Status: Abnormal   Collection Time   04/08/12  4:52 AM  Component Value Range Comment   WBC 22.7 (*) 4.0 - 10.5 K/uL    RBC 3.94  3.87 - 5.11 MIL/uL    Hemoglobin 11.1 (*) 12.0 - 15.0 g/dL    HCT 32.4 (*) 36.0 - 46.0 %     MCV 82.2  78.0 - 100.0 fL    MCH 28.2  26.0 - 34.0 pg    MCHC 34.3  30.0 - 36.0 g/dL    RDW 15.5  11.5 - 15.5 %    Platelets 67 (*) 150 - 400 K/uL CONSISTENT WITH PREVIOUS RESULT  COMPREHENSIVE METABOLIC PANEL     Status: Abnormal   Collection Time   04/08/12  4:52 AM      Component Value Range Comment   Sodium 141  135 - 145 mEq/L    Potassium 3.5  3.5 - 5.1 mEq/L    Chloride 110  96 - 112 mEq/L    CO2 21  19 - 32 mEq/L    Glucose, Bld 120 (*) 70 - 99 mg/dL    BUN 67 (*) 6 - 23 mg/dL    Creatinine, Ser 2.47 (*) 0.50 - 1.10 mg/dL    Calcium 8.1 (*) 8.4 - 10.5 mg/dL    Total Protein 4.8 (*) 6.0 - 8.3 g/dL    Albumin 1.6 (*) 3.5 - 5.2 g/dL    AST 77 (*) 0 - 37 U/L    ALT 108 (*) 0 - 35 U/L    Alkaline Phosphatase 255 (*) 39 - 117 U/L    Total Bilirubin 0.2 (*) 0.3 - 1.2 mg/dL    GFR calc non Af Amer 19 (*) >90 mL/min    GFR calc Af Amer 22 (*) >90 mL/min   PROTIME-INR     Status: Normal   Collection Time   04/08/12  4:52 AM      Component Value Range Comment   Prothrombin Time 14.9  11.6 - 15.2 seconds    INR 1.19  0.00 - 1.49   APTT     Status: Abnormal   Collection Time   04/08/12  4:52 AM      Component Value Range Comment   aPTT 90 (*) 24 - 37 seconds   GLUCOSE, CAPILLARY     Status: Abnormal   Collection Time   04/08/12  7:23 AM      Component Value Range Comment   Glucose-Capillary 117 (*) 70 - 99 mg/dL   GLUCOSE, CAPILLARY     Status: Normal   Collection Time   04/08/12 11:13 AM      Component Value Range Comment   Glucose-Capillary 81  70 - 99 mg/dL   GLUCOSE, CAPILLARY     Status: Normal   Collection Time   04/08/12  3:23 PM      Component Value Range Comment   Glucose-Capillary 72  70 - 99 mg/dL   GLUCOSE, CAPILLARY     Status: Abnormal   Collection Time   04/08/12  7:32 PM      Component Value Range Comment   Glucose-Capillary 67 (*) 70 - 99 mg/dL    Comment 1 Notify RN     GLUCOSE, CAPILLARY     Status: Abnormal   Collection Time   04/08/12  9:16  PM      Component Value Range Comment   Glucose-Capillary 63 (*) 70 - 99 mg/dL    Comment 1 Repeat Test     GLUCOSE, CAPILLARY     Status: Abnormal   Collection Time  04/08/12  9:26 PM      Component Value Range Comment   Glucose-Capillary 132 (*) 70 - 99 mg/dL   GLUCOSE, CAPILLARY     Status: Abnormal   Collection Time   04/08/12 11:47 PM      Component Value Range Comment   Glucose-Capillary 109 (*) 70 - 99 mg/dL   GLUCOSE, CAPILLARY     Status: Normal   Collection Time   04/09/12  3:51 AM      Component Value Range Comment   Glucose-Capillary 99  70 - 99 mg/dL   HEPARIN LEVEL (UNFRACTIONATED)     Status: Normal   Collection Time   04/09/12  5:00 AM      Component Value Range Comment   Heparin Unfractionated 0.58  0.30 - 0.70 IU/mL   CBC     Status: Abnormal   Collection Time   04/09/12  5:00 AM      Component Value Range Comment   WBC 24.8 (*) 4.0 - 10.5 K/uL    RBC 3.82 (*) 3.87 - 5.11 MIL/uL    Hemoglobin 10.9 (*) 12.0 - 15.0 g/dL    HCT 31.4 (*) 36.0 - 46.0 %    MCV 82.2  78.0 - 100.0 fL    MCH 28.5  26.0 - 34.0 pg    MCHC 34.7  30.0 - 36.0 g/dL    RDW 15.8 (*) 11.5 - 15.5 %    Platelets 84 (*) 150 - 400 K/uL CONSISTENT WITH PREVIOUS RESULT  BASIC METABOLIC PANEL     Status: Abnormal   Collection Time   04/09/12  5:00 AM      Component Value Range Comment   Sodium 143  135 - 145 mEq/L    Potassium 3.1 (*) 3.5 - 5.1 mEq/L    Chloride 111  96 - 112 mEq/L    CO2 23  19 - 32 mEq/L    Glucose, Bld 119 (*) 70 - 99 mg/dL    BUN 68 (*) 6 - 23 mg/dL    Creatinine, Ser 2.28 (*) 0.50 - 1.10 mg/dL    Calcium 8.3 (*) 8.4 - 10.5 mg/dL    GFR calc non Af Amer 21 (*) >90 mL/min    GFR calc Af Amer 24 (*) >90 mL/min   GLUCOSE, CAPILLARY     Status: Abnormal   Collection Time   04/09/12  8:14 AM      Component Value Range Comment   Glucose-Capillary 110 (*) 70 - 99 mg/dL   CLOSTRIDIUM DIFFICILE BY PCR     Status: Normal   Collection Time   04/09/12 11:39 AM       Component Value Range Comment   C difficile by pcr NEGATIVE  NEGATIVE   GLUCOSE, CAPILLARY     Status: Abnormal   Collection Time   04/09/12 12:42 PM      Component Value Range Comment   Glucose-Capillary 105 (*) 70 - 99 mg/dL     Recent Results (from the past 240 hour(s))  URINE CULTURE     Status: Normal   Collection Time   04/03/12 10:19 PM      Component Value Range Status Comment   Specimen Description URINE, RANDOM   Final    Special Requests CX ADDED AT 0332    Final    Culture  Setup Time 04/04/2012 03:42   Final    Colony Count >=100,000 COLONIES/ML   Final    Culture ESCHERICHIA COLI   Final  Report Status 04/05/2012 FINAL   Final    Organism ID, Bacteria ESCHERICHIA COLI   Final   CULTURE, BLOOD (ROUTINE X 2)     Status: Normal   Collection Time   04/03/12 10:20 PM      Component Value Range Status Comment   Specimen Description BLOOD RIGHT ARM   Final    Special Requests BOTTLES DRAWN AEROBIC AND ANAEROBIC 5CC EACH   Final    Culture  Setup Time 04/04/2012 04:51   Final    Culture     Final    Value: ESCHERICHIA COLI     16 Note: Gram Stain Report Called to,Read Back By and Verified With: ASHLEY HARRISON AT 8:10PM 10 13 BY [place]   Report Status 04/06/2012 FINAL   Final    Organism ID, Bacteria ESCHERICHIA COLI   Final   CULTURE, BLOOD (ROUTINE X 2)     Status: Normal   Collection Time   04/03/12 10:30 PM      Component Value Range Status Comment   Specimen Description BLOOD RIGHT HAND   Final    Special Requests BOTTLES DRAWN AEROBIC ONLY 1CC   Final    Culture  Setup Time 04/04/2012 04:51   Final    Culture     Final    Value: ESCHERICHIA COLI     Note: SUSCEPTIBILITIES PERFORMED ON PREVIOUS CULTURE WITHIN THE LAST 5 DAYS.     16 Note: Gram Stain Report Called to,Read Back By and Verified With: ASHLEY HARRISON AT 8:10PM 10 13 BY [id number]   Report Status 04/06/2012 FINAL   Final   MRSA PCR SCREENING     Status: Normal   Collection Time   04/04/12   2:05 AM      Component Value Range Status Comment   MRSA by PCR NEGATIVE  NEGATIVE Final   CULTURE, BLOOD (ROUTINE X 2)     Status: Normal (Preliminary result)   Collection Time   04/06/12 11:20 AM      Component Value Range Status Comment   Specimen Description BLOOD ARM RIGHT   Final    Special Requests BOTTLES DRAWN AEROBIC AND ANAEROBIC 10CC   Final    Culture  Setup Time 04/06/2012 21:41   Final    Culture     Final    Value:        BLOOD CULTURE RECEIVED NO GROWTH TO DATE CULTURE WILL BE HELD FOR 5 DAYS BEFORE ISSUING A FINAL NEGATIVE REPORT   Report Status PENDING   Incomplete   CULTURE, BLOOD (ROUTINE X 2)     Status: Normal (Preliminary result)   Collection Time   04/06/12 11:35 AM      Component Value Range Status Comment   Specimen Description BLOOD ARM RIGHT   Final    Special Requests BOTTLES DRAWN AEROBIC AND ANAEROBIC 10CC   Final    Culture  Setup Time 04/06/2012 21:40   Final    Culture     Final    Value:        BLOOD CULTURE RECEIVED NO GROWTH TO DATE CULTURE WILL BE HELD FOR 5 DAYS BEFORE ISSUING A FINAL NEGATIVE REPORT   Report Status PENDING   Incomplete   GRAM STAIN     Status: Normal   Collection Time   04/07/12  9:22 AM      Component Value Range Status Comment   Specimen Description URINE, RANDOM   Final    Special Requests NONE  Final    Gram Stain     Final    Value: URINE CYTOSPIN     WBC PRESENT,BOTH PMN AND MONONUCLEAR     NEGATIVE FOR BACTERIA     Gram Stain Report Called to,Read Back By and Verified With: SANDOVAL J,RN 1025 04/07/12 SCALES H   Report Status 04/07/2012 FINAL   Final   CLOSTRIDIUM DIFFICILE BY PCR     Status: Normal   Collection Time   04/09/12 11:39 AM      Component Value Range Status Comment   C difficile by pcr NEGATIVE  NEGATIVE Final     Ct Head Wo Contrast 04/08/2012   No acute intracranial abnormality.  Mild chronic sinusitis.   Original Report Authenticated By: Truett Perna, M.D.    US Renal Port  04/07/2012   Negative for hydronephrosis.  15 mm hypoechoic structure in the mid left renal hilum.  This may be a parapelvic cyst or possibly renal abscess.  This is unchanged from the  prior study.  Consider follow-up CT for further evaluation.   Original Report Authenticated By: Truett Perna, M.D.    Dg Chest Port 1 View 04/09/2012  Decreasing lung volumes with worsening bibasilar aeration which likely reflects a combination of atelectasis and/or consolidation with superimposed bilateral pleural effusions (small on the right and moderate on the left)  Mild cardiomegaly is unchanged.   Original Report Authenticated By: Etheleen Mayhew, M.D.      Assessment/Plan:   66yo female with septic shock. Sepsis due to [facility] bacteria in urine and blood stream. She then had ureter stone which required JJ stent (left). Patient agitated requiring sedation which is trying to be weaned. She is on a trial protocol acute delirium as well. Acute metabolic encephalopathy. Will order lactic acid level and ammonia level (elevated LFTs). Watch mental status over next 12 hours as weaning occurs.  Wayland Denis, MBA, MHA [facility] Pager [phone]    Patient was examined and recommendations made me personally for management.  Rush Farmer M.D. [facility]

## 2012-04-09 NOTE — Progress Notes (Signed)
Name: [name] MRN: [id number] DOB: [birth year]    LOS: 6  Referring Provider:  Zenia Resides EDP Reason for Referral:  Septic shock  PULMONARY / CRITICAL CARE MEDICINE  Brief patient description:  This is a 66 y/o female admitted on 10/16 to [facility] with septic shock likely from pyelonephritis.  DDx includes an obstructing kidney stone given her history of nephrolithiasis as well as (less likely) severe CAP given hypoxemia and crackles in left base.  Events Since Admission: 10/15 CT Ab/pelv (stone protocol) >> 10/15: ETT>>> 10/16: Cystoscopy, Left Retrograde Pyelogram, with interpretation, and Left JJ stent ( 51F x 24 cm). 10/17- refractory shock, acidosis, fib, new dx septic cardiomyopathy likely 10/19 renal u/s>>no hydroneph 10/20 CT head>>NEg   Current Status: On amiodarone and heparin gtt.  Off vasopressors. Agitated on propofol 7.45mcg/kg/min   Vital Signs: Temp:  [97.6 F (36.4 C)-98.5 F (36.9 C)] 97.6 F (36.4 C) (10/21 0432) Pulse Rate:  [70-94] 70  (10/21 0500) Resp:  [19-24] 24  (10/21 0500) BP: (88-114)/(41-77) 97/55 mmHg (10/21 0500) SpO2:  [92 %-100 %] 100 % (10/21 0500) Arterial Line BP: (131)/(64) 131/64 mmHg (10/20 0800) FiO2 (%):  [40 %] 40 % (10/21 0500) Weight:  [244 lb 0.8 oz (110.7 kg)] 244 lb 0.8 oz (110.7 kg) (10/21 0500)  Physical Examination: Gen: intubated, agitated HEENT: NCAT, MM dry, PERRL, EOMi PULM: coarse breath sounds CV: Tachy, irregular, distant heart sounds AB: BS+, soft, nontender, no masses, no guarding or rebound Ext: warm feet compared to yesterday. Blue discoloration present in feet and fingers.  improving Neuro: doesn't follow command. Encephalopathic  Principal Problem:  *Septic shock Active Problems:  Pyelonephritis  Hypoxemia  Acute encephalopathy  Nephrolithiasis  AKI (acute kidney injury)   ASSESSMENT AND PLAN  PULMONARY  Lab 04/06/12 1404 04/06/12 0429 04/05/12 1532 04/05/12 1210 04/05/12 0432 04/04/12 2051  PHART  7.381 7.391 7.388 -- 7.345* 7.278*  PCO2ART 30.4* 27.2* 26.6* -- 29.5* 30.5*  PO2ART 104.0* 111.0* 121.0* -- 149.0* 150.0*  HCO3 17.9* 16.5* 15.7* -- 16.1* 14.3*  O2SAT 98.0 98.0 99.1 78.3 99.0 --   Ventilator Settings: Vent Mode:  [-] PRVC FiO2 (%):  [40 %] 40 % Set Rate:  [24 bmp] 24 bmp Vt Set:  [470 mL] 470 mL PEEP:  [5 cmH20] 5 cmH20 Pressure Support:  [8 cmH20] 8 cmH20 Plateau Pressure:  [17 cmH20-19 cmH20] 19 cmH20 CXR:  Cardiomegally, bibasilar atelectasis, less edema ETT:  10/15  A: Acute respiratory failure Likely a degree of ALI (ratio meets 268 ALI at best now), cvp was up as well Left effusion likely  P:   - currently on PRVC, 40% FiO2, 5 PEEP. Tolerated PS mode. Will not attempt to extubate given neuro status.  - cycle psv today - hold lasix for today -will likely need trach  CARDIOVASCULAR  Lab 04/05/12 0437 04/05/12 0035 04/04/12 1807 04/04/12 1805 04/04/12 1230 04/04/12 0428 04/04/12 0210 04/03/12 2222  TROPONINI -- 1.49* 1.49* -- 1.12* 0.44* <0.30 --  LATICACIDVEN 3.1* -- -- 3.8* 3.0* -- -- 5.0*  PROBNP -- -- -- -- -- -- 8289.0* --   ECG:  None Lines: 10/15 R IJ (EDP) >>  A: Septic shock: last dose norepi: 10/18 Echo: EF: 20-25%. Septic cardiomyopathy likely  NSTEMI r/o, unlikely New onset a-fib 10/17: loaded with amio, now on amio gtt and heparin  P:  -start amiodarone 400mg  bid per tube, get off drip -cont hep drip  RENAL  Lab 04/09/12 0500 04/08/12 0452 04/07/12 1450 04/07/12 0410  04/06/12 1144 04/06/12 0409 04/05/12 1216 04/04/12 0428 04/04/12 0210  NA 143 141 138 140 135 -- -- -- --  K 3.1* 3.5 -- -- -- -- -- -- --  CL 111 110 107 108 103 -- -- -- --  CO2 23 21 21 19  18* -- -- -- --  BUN 68* 67* 64* 64* 59* -- -- -- --  CREATININE 2.28* 2.47* 2.59* 2.61* 2.70* -- -- -- --  CALCIUM 8.3* 8.1* 8.0* 8.1* 8.1* -- -- -- --  MG -- -- -- -- -- 2.3 2.1 1.2* 1.0*  PHOS -- -- -- -- -- -- -- 4.2 2.2*   Intake/Output      10/20 0701 - 10/21 0700    I.V. (mL/kg) 1412.4 (12.8)   NG/GT 410   IV Piggyback 54   Total Intake(mL/kg) 1876.4 (17)   Urine (mL/kg/hr) 2895 (1.1)   Stool 900   Total Output 3795   Net -1918.6        Foley:  10/15 >>  A:  ATN from shock, increased urine output 10/19: possibly from post ATN diuresis History of nephrolithiasis now with L pyelonephritis: CT: impacted 53mm left upper ureteral stone at [facility].  - left JJ stent placed.  - no evidence of hydronephrosis on repeat US 10/16, perc drain held, repeat renal u/s 10/19: no hydronephrosis  P:   - improving creatinine and making urine - BMP in AM - 40 mEq K given   -GASTROINTESTINAL  Lab 04/08/12 0452 04/07/12 0410 04/05/12 0436 04/04/12 0428 04/03/12 2221  AST 77* 98* 231* 127* 104*  ALT 108* 145* 247* 80* 71*  ALKPHOS 255* 288* 143* 209* 169*  BILITOT 0.2* 0.4 0.7 0.9 0.9  PROT 4.8* 5.0* 4.6* 5.2* 5.1*  ALBUMIN 1.6* 1.8* 1.9* 2.4* 2.5*    A:  Elevated LFT's, likely from shock liver: improving P:  - LFT's improving - tube feeds continue -stop PPI, change to H2 blocker for diarrhea   HEMATOLOGIC  Lab 04/09/12 0500 04/08/12 0452 04/07/12 1450 04/07/12 0410 04/06/12 1930 04/06/12 1618 04/05/12 1216  HGB 10.9* 11.1* 11.2* 11.5* -- 11.5* --  HCT 31.4* 32.4* 32.2* 32.8* -- 33.1* --  PLT 84* 67* 59* 63* -- 64* --  INR -- 1.19 -- 1.18 1.22 -- 1.48  APTT -- 90* -- 50* -- -- 47*   A:  Bleeding from urine-resolving Thrombocytopenia, likely from E-coli urosepsis,  Improving. Note extremities are better, with improved perfusion P:  -monitor platelets on heparin as they are trending down. If worsening drop and evidence of bleeding, stop heparin.   INFECTIOUS  Lab 04/09/12 0500 04/08/12 0452 04/07/12 1450 04/07/12 0410 04/06/12 1618 04/04/12 0210  WBC 24.8* 22.7* 21.4* 24.9* 29.9* --  PROCALCITON -- -- -- -- -- 82.91   Cultures: 10/15 Urine >>e-coli pansensitive 10/15 Blood >>e-coli, pansensitive.  10/15 Urine st/leg ag>>neg 10/18: blood>> UA:  10/18: large leuks, large nitrites.    Antibiotics: 10/15 zosyn x1  10/15 vanc (severe cap) >>DC 10/15 ceftriaxone (pyelo, severe CAP) >>10/16 10/15 azithro (severe cap) >>10/16 10/16: levofloxacin >>>10/18 10/16: ceftaz>>>10/18  10/18 ceftriaxone (urine and blood cult pos ecoli)>>>  A:  Septic shock: E-coli bacteremia from pyelonephritis with obstructive uropathy; Now improved  WBC and fever curve are better P:   -cont  ceftraixone. Fever curve improving. Still elevated WBC. Continue to monitor for now - c-diff in setting of loose stools and elevated WBC - f/u repeat [facility]  ENDOCRINE  Lab 04/09/12 0351 04/08/12 2347 04/08/12 2126 04/08/12 2116  04/08/12 1932  GLUCAP 99 109* 132* 63* 67*   A:  Episode of hypoglycemia at 66, given D25 P:   -monitor glucose   NEUROLOGIC  A:  Acute encephalopathy from sepsis, not  improving ?CVA, very agitated this AM Head CT: no acute process.  P:   -on propofol and on MIND [place] trial: haldol vs zirprazidone vs placebo.  - start klonopin for agitation on schedule - consult neurology   BEST PRACTICE / DISPOSITION Level of Care:  ICU Primary Service:  PCCM Consultants:  Urology: signed out 10/17 Code Status:  full Diet:  TF DVT Px: hep IV fib 10/18>>> GI Px:  Zantac  Skin Integrity:  normal Social / Family:  Updated daughter at bedside daily  CC time 30 minutes    04/09/2012, 6:52 AM  LOSQ, [facility], PGY-2 ICU rotation  I have seen and examined this pt with resident and made adjustments to note.  Mariel Sleet Beeper  [phone]  Cell  [phone]  If no response or cell goes to voicemail, call beeper [phone]

## 2012-04-09 NOTE — Care Management Note (Signed)
    Page 1 of 1   04/16/2012     8:23:56 AM   CARE MANAGEMENT NOTE 04/16/2012  Patient:  [name], [name]   Account Number:  [id number]  Date Initiated:  04/04/2012  Documentation initiated by:  [facility]  Subjective/Objective Assessment:   Sepsis - pylo.     Action/Plan:   Anticipated DC Date:  04/11/2012   Anticipated DC Plan:  [place]  CM consult      Choice offered to / List presented to:             Status of service:  In process, will continue to follow Medicare Important Message given?   (If response is "NO", the following Medicare IM given date fields will be blank) Date Medicare IM given:   Date Additional Medicare IM given:    Discharge Disposition:    Per UR Regulation:  Reviewed for med. necessity/level of care/duration of stay  If discussed at [place] of Stay Meetings, dates discussed:   04/10/2012  04/12/2012    Comments:  Contact:  Choma,Michelle Daughter [id number]  04-13-12 Sharon, RNBSN [phone] PT/OT recommending CIR.  CIR consult placed.  04-12-12 10am Luz Lex, [place] [id number] Extubated notw - but restless and confused.  Insurance does not have Ltach benefit.  Plan at this point is rehab - CIR vs SNF.  Will need PT/OT consults.  04-09-12 11am [place] Talked with daughter at bedside.  patient remains intubated and sedated.  When not sedated, very restless, agitated. may need trach.  Talked with daughter about rehab options. [facility] - will need to check with insurance to see if has Ltach options.  Referral placed.

## 2012-04-09 NOTE — Progress Notes (Signed)
ANTICOAGULATION CONSULT NOTE -Follow Up Consult  Pharmacy Consult for Heparin Indication: Atrial fibrillation  No Known Allergies  Patient Measurements: Height: 5\' 4"  (162.6 cm) Weight: 244 lb 0.8 oz (110.7 kg) IBW/kg (Calculated) : 54.7  Heparin Dosing Weight: 82 kg  Vital Signs: Temp: 97.8 F (36.6 C) (10/21 1254) Temp src: Oral (10/21 1254) BP: 96/55 mmHg (10/21 1500) Pulse Rate: 80  (10/21 1500)  Labs:  Basename 04/09/12 0500 04/08/12 0452 04/07/12 2322 04/07/12 1450 04/07/12 0410 04/06/12 1930  HGB 10.9* 11.1* -- -- -- --  HCT 31.4* 32.4* -- 32.2* -- --  PLT 84* 67* -- 59* -- --  APTT -- 90* -- -- 50* --  LABPROT -- 14.9 -- -- 14.8 15.2  INR -- 1.19 -- -- 1.18 1.22  HEPARINUNFRC 0.58 0.31 0.32 -- -- --  CREATININE 2.28* 2.47* -- 2.59* -- --  CKTOTAL -- -- -- -- -- --  CKMB -- -- -- -- -- --  TROPONINI -- -- -- -- -- --    Estimated Creatinine Clearance: 29.1 ml/min (by C-G formula based on Cr of 2.28).   Medical History: Past Medical History  Diagnosis Date  . Hypertension   . Pneumonia   . GERD (gastroesophageal reflux disease)   . Headache     Assessment: 66 yo female with atrial fibrillation CVR on amiodarone on heparin drip 2300 uts/hr HL 0.58 at goal, but trending up so will decrease slightly. H/H stable, platelets low and stable.   Goal of Therapy:  Heparin 0.3-0.7 Monitor platelets by anticoagulation protocol: Yes   Plan:  1. decrease heparin to 2200 units/hr 2. Daily HL   Bonnita Nasuti Pharm.D. CPP, BCPS Clinical Pharmacist [phone] 04/09/2012 3:30 PM

## 2012-04-10 ENCOUNTER — Encounter (HOSPITAL_COMMUNITY): Payer: Self-pay | Admitting: Urology

## 2012-04-10 ENCOUNTER — Inpatient Hospital Stay (HOSPITAL_COMMUNITY): Payer: Medicare Other

## 2012-04-10 DIAGNOSIS — G9340 Encephalopathy, unspecified: Secondary | ICD-10-CM

## 2012-04-10 DIAGNOSIS — I4891 Unspecified atrial fibrillation: Secondary | ICD-10-CM

## 2012-04-10 LAB — CBC
HCT: 30 % — ABNORMAL LOW (ref 36.0–46.0)
Hemoglobin: 10.3 g/dL — ABNORMAL LOW (ref 12.0–15.0)
MCH: 28.5 pg (ref 26.0–34.0)
MCHC: 34.3 g/dL (ref 30.0–36.0)
MCV: 82.9 fL (ref 78.0–100.0)
Platelets: 109 10*3/uL — ABNORMAL LOW (ref 150–400)
RBC: 3.62 MIL/uL — ABNORMAL LOW (ref 3.87–5.11)
RDW: 16.2 % — ABNORMAL HIGH (ref 11.5–15.5)
WBC: 24.7 10*3/uL — ABNORMAL HIGH (ref 4.0–10.5)

## 2012-04-10 LAB — COMPREHENSIVE METABOLIC PANEL
ALT: 70 U/L — ABNORMAL HIGH (ref 0–35)
AST: 53 U/L — ABNORMAL HIGH (ref 0–37)
Albumin: 1.7 g/dL — ABNORMAL LOW (ref 3.5–5.2)
Alkaline Phosphatase: 270 U/L — ABNORMAL HIGH (ref 39–117)
BUN: 68 mg/dL — ABNORMAL HIGH (ref 6–23)
CO2: 22 mEq/L (ref 19–32)
Calcium: 8.4 mg/dL (ref 8.4–10.5)
Chloride: 115 mEq/L — ABNORMAL HIGH (ref 96–112)
Creatinine, Ser: 2.04 mg/dL — ABNORMAL HIGH (ref 0.50–1.10)
GFR calc Af Amer: 28 mL/min — ABNORMAL LOW (ref >90)
GFR calc non Af Amer: 24 mL/min — ABNORMAL LOW (ref >90)
Glucose, Bld: 108 mg/dL — ABNORMAL HIGH (ref 70–99)
Potassium: 3.3 mEq/L — ABNORMAL LOW (ref 3.5–5.1)
Sodium: 148 mEq/L — ABNORMAL HIGH (ref 135–145)
Total Bilirubin: 0.3 mg/dL (ref 0.3–1.2)
Total Protein: 5.1 g/dL — ABNORMAL LOW (ref 6.0–8.3)

## 2012-04-10 LAB — PROTIME-INR
INR: 1.18 (ref 0.00–1.49)
Prothrombin Time: 14.8 seconds (ref 11.6–15.2)

## 2012-04-10 LAB — GLUCOSE, CAPILLARY
Glucose-Capillary: 103 mg/dL — ABNORMAL HIGH (ref 70–99)
Glucose-Capillary: 106 mg/dL — ABNORMAL HIGH (ref 70–99)
Glucose-Capillary: 106 mg/dL — ABNORMAL HIGH (ref 70–99)
Glucose-Capillary: 108 mg/dL — ABNORMAL HIGH (ref 70–99)
Glucose-Capillary: 122 mg/dL — ABNORMAL HIGH (ref 70–99)
Glucose-Capillary: 84 mg/dL (ref 70–99)

## 2012-04-10 LAB — HEPARIN LEVEL (UNFRACTIONATED): Heparin Unfractionated: 0.64 IU/mL (ref 0.30–0.70)

## 2012-04-10 MED ORDER — FREE WATER
200.0000 mL | Freq: Four times a day (QID) | Status: DC
  Administered 2012-04-10 – 2012-04-12 (×7): 200 mL

## 2012-04-10 MED ORDER — POTASSIUM CHLORIDE 20 MEQ/15ML (10%) PO LIQD
40.0000 meq | Freq: Once | ORAL | Status: AC
  Administered 2012-04-10: 40 meq via ORAL
  Filled 2012-04-10 (×2): qty 15

## 2012-04-10 MED ORDER — CIPROFLOXACIN IN D5W 400 MG/200ML IV SOLN
400.0000 mg | Freq: Two times a day (BID) | INTRAVENOUS | Status: DC
  Administered 2012-04-10 – 2012-04-15 (×11): 400 mg via INTRAVENOUS
  Filled 2012-04-10 (×15): qty 200

## 2012-04-10 NOTE — Consult Note (Signed)
CARDIOLOGY CONSULT NOTE  Patient ID: [name], MRN: [id number], DOB/AGE: August 13, 1945 66 y.o. Admit date: 04/03/2012 Date of Consult: 04/10/2012  Primary Physician: [name], MD Primary Cardiologist: None, New to [place]  Chief Complaint: left flank pain, shortness of breath, confusion Reason for Consultation: A.Fib in the setting of uroseptic shock  HPI: 66 y.o. female w/ PMHx significant for HTN, nephrolithiasis and no prior cardiac history, who presented to [facility] on 04/03/2012 with complaints of left flank pain, shortness of breath, and confusion and was found to have E-coli bacteremia from pyelonephritis with obstructive uropathy.  Patient intubated and no family at bedside. Information obtained from records. No prior documented cardiac history. Presented to the ED with complaints of sudden onset left flank pain, shortness of breath and confusion. EKG showed sinus tachycardia 139bpm, RAD, nonspecific ST/T changes. CXR showed cardiomegaly with ? mild pulm venous congestion. She was febrile, hypoxic, tachycardic, and hypotensive with pyuria and nitrite (+) UA for which she was started on empiric abx and vasopressors. She developed respiratory distress requiring intubation and mechanical ventilation. Abd/pelvis CT revealed left renal obstructing stone for which urology performed cystoscopy with left JJ stent. Urine and blood cx grew out [facility]. Cardiac enzymes were cycled with peak troponin 1.49 which was felt to be demand ischemia in the setting of septic shock. Echo revealed severe LV systolic dysfunction with EF 20-25% and diffuse hypokinesis which was felt due to septic cardiomyopathy. She was placed on Dobutamine. She developed atrial fibrillation with RVR on 10/17 for which she was placed on an amiodarone drip. Anticoagulation was not started initially due to anemia, thrombocytopenia and hematuria, but the hematuria cleared and she was eventually placed on a Heparin  drip. She converted to NSR on 10/21 and was transitioned to oral amiodarone 400mg  bid. Repeat echo shows improved LV systolic function with EF 60-65% and no WMAs. Now off vasopressors, but has remained intubated due to altered mental status. Head CT without acute intracranial process. Neurology evaluated and felt encephalopathy 2/2 underlying metabolic and infectious sources. Cardiology is asked to address issues of long term amiodarone therapy and need for long term anti-coagulation.  Past Medical History  Diagnosis Date  . Hypertension   . Pneumonia   . GERD (gastroesophageal reflux disease)   . Headache       Surgical History:  Past Surgical History  Procedure Date  . Abdominal hysterectomy   . Cholecystectomy   . Cystoscopy w/ ureteral stent placement 04/04/2012    Procedure: CYSTOSCOPY WITH RETROGRADE PYELOGRAM/URETERAL STENT PLACEMENT;  Surgeon: Ailene Rud, MD;  Location: [place];  Service: Urology;  Laterality: Left;     Home Meds: Medication Sig  bimatoprost (LUMIGAN) 0.01 % SOLN Place 1 drop into both eyes at bedtime.  hydrochlorothiazide (MICROZIDE) 12.5 MG capsule Take 12.5 mg by mouth daily.  lisinopril (PRINIVIL,ZESTRIL) 5 MG tablet Take 5 mg by mouth daily.    Inpatient Medications:   . amiodarone  400 mg Oral BID  . antiseptic oral rinse  15 mL Mouth Rinse QID  . chlorhexidine  15 mL Mouth Rinse BID  . ciprofloxacin  400 mg Intravenous Q12H  . clonazePAM  1 mg Oral Q8H  . feeding supplement (OXEPA)  1,000 mL Per Tube Q24H  . feeding supplement  60 mL Per Tube TID  . free water  200 mL Per Tube Q6H  . MIND-[place] study drug  (PI-Feinstein)  2 mL Intravenous Q12H  . MIND-[place] study drug  (PI-Feinstein)  1  mL Intravenous Once  . multivitamin  5 mL Per Tube Daily  . potassium chloride  40 mEq Oral Once  . ranitidine  150 mg Per Tube QHS  . DISCONTD: cefTRIAXone (ROCEPHIN)  IV  1 g Intravenous Q24H  . DISCONTD: MIND-USA study drug  (PI-Feinstein)  1 mL  Intravenous Q12H   . sodium chloride 500 mL (04/10/12 1154)  . heparin 2,200 Units/hr (04/10/12 1154)  . propofol 20 mcg/kg/min (04/10/12 1155)    Allergies: No Known Allergies  History   Social History  . Marital Status: Widowed    Spouse Name: N/A    Number of Children: N/A  . Years of Education: N/A   Occupational History  . Not on file.   Social History Main Topics  . Smoking status: Never Smoker   . Smokeless tobacco: Not on file  . Alcohol Use: No  . Drug Use: No  . Sexually Active:    Other Topics Concern  . Not on file   Social History Narrative  . No narrative on file     Family history: Unable to obtain as pt sedated and intubated  Review of Systems Unable to obtain as pt sedated and intubated   Labs:  04/07/2012 04:10 04/07/2012 14:50 04/08/2012 04:52 04/09/2012 05:00 04/10/2012 04:40  WBC 24.9 (H) 21.4 (H) 22.7 (H) 24.8 (H) 24.7 (H)  RBC 3.99 3.95 3.94 3.82 (L) 3.62 (L)  Hemoglobin 11.5 (L) 11.2 (L) 11.1 (L) 10.9 (L) 10.3 (L)  HCT 32.8 (L) 32.2 (L) 32.4 (L) 31.4 (L) 30.0 (L)  MCV 82.2 81.5 82.2 82.2 82.9  MCH 28.8 28.4 28.2 28.5 28.5  MCHC 35.1 34.8 34.3 34.7 34.3  RDW 15.3 15.2 15.5 15.8 (H) 16.2 (H)  Platelets 63 (L) 59 (L) 67 (L) 84 (L) 109 (L)      04/10/2012 04:40  Prothrombin Time 14.8  INR 1.18   Lab 04/10/12 0440  NA 148*  K 3.3*  CL 115*  CO2 22  BUN 68*  CREATININE 2.04*  CALCIUM 8.4  PROT 5.1*  BILITOT 0.3  ALKPHOS 270*  ALT 70*  AST 53*  GLUCOSE 108*   Component Value Date   DDIMER 13.75* 04/05/2012     04/04/2012 02:10 04/04/2012 04:28 04/04/2012 12:30 04/04/2012 18:07 04/05/2012 00:35  Troponin I <0.30 0.44 (HH) 1.12 (HH) 1.49 (HH) 1.49 ([place])     04/04/2012 02:10  Pro B Natriuretic peptide (BNP) 8289.0 (H)     04/05/2012 12:16  TSH 0.475     04/04/2012 00:39 04/04/2012 02:10 04/04/2012 02:10  Cortisol, Plasma 44.2 34.7 33.8    Radiology/Studies:    04/10/2012 - PORTABLE CHEST - 1 VIEW Findings:  Endotracheal tube terminates 2.8 cm above carina. Nasogastric tube extends beyond the  inferior aspect of the film. Right IJ central line tip at right brachycephalic vein.  Cardiomegaly accentuated by AP portable technique.  Similar small left pleural effusion. No pneumothorax.  Low lung volumes with superimposed mild interstitial edema.  Minimally improved. Improved right base aeration with persistent left greater than right bibasilar airspace disease.  IMPRESSION:  1.  Slightly improved aeration, with decreased congestive heart failure and right base air space disease. 2.  Small left pleural effusion and left base atelectasis or infection remaining. 3.  Endotracheal tube appropriately position.    04/08/2012 - CT HEAD WITHOUT CONTRAST Findings: Age appropriate atrophy.  Negative for acute infarct. Negative for hemorrhage or mass.  No edema in the brain.  No fluid collection is identified.  Calvarium is  intact.  Mucosal edema in the ethmoid sinuses.  IMPRESSION: No acute intracranial abnormality.  Mild chronic sinusitis.      04/08/12 - ABI Location PressureBrachial indexWaveform  +-----------------+--------+--------------+---------+ Right ant tibial 15mm Hg1.08 Triphasic +-----------------+--------+--------------+---------+ Right post tibial175mm Hg1.23 Triphasic +-----------------+--------+--------------+---------+ Left ant tibial 169mm Hg1.15 Triphasic +-----------------+--------+--------------+---------+ Left post tibial 1105mm Hg1.12 Triphasic +-----------------+--------+--------------+---------+ Bilateral normal ABI study  04/07/2012 - RENAL/URINARY TRACT ULTRASOUND COMPLETE Findings:  Right Kidney:  10.19 cm in length without obstruction.  8 mm right lower pole stone is unchanged.  Left Kidney:  13.9 cm in length.  Negative for obstruction.  16 mm hypoechoic structure in the mid left kidney is unchanged and  may be a cyst or possibly a renal abscess.  This is  unchanged from the prior study.  I would expect a renal abscess to have  changed  in the interval but still  is possible.  Bladder:  Empty bladder with Foley catheter.  IMPRESSION: Negative for hydronephrosis.  15 mm hypoechoic structure in the mid left renal hilum.  This may be a parapelvic cyst or possibly renal abscess.  This is unchanged from the  prior study.  Consider follow-up CT for further evaluation.     04/04/2012 - RETROGRADE PYELOGRAM  Findings: Opacification of the left upper renal collecting system demonstrates a stone near the left ureteropelvic junction.  A stent was placed within the left renal pelvis. Stone may have been advanced into the left renal pelvis on the last image.  IMPRESSION: Placement of left ureteral stent.  Stone at the left ureteropelvic junction.      04/04/2012 - CT ABDOMEN AND PELVIS WITHOUT CONTRAST   Findings: Atelectasis or infiltration in both lung bases.  Large esophageal hiatal hernia.  Multiple intrarenal stones bilaterally, with four stones in the right kidney and two stones in the left kidney.  In the left ureteropelvic junction, there is an additional 7 mm stone.  There is a moderate proximal pyelocaliectasis with periureteral and pararenal stranding suggesting moderate obstruction.  The distal left ureter is decompressed.  No right ureteral stones are demonstrated.  Infection is not excluded but no abscesses are demonstrated.  Surgical absence of the gallbladder.  The unenhanced appearance of the liver, spleen, pancreas, adrenal glands, abdominal aorta, and retroperitoneal lymph nodes is unremarkable.  Small periumbilical hernia containing fat.  The stomach, small bowel, and colon are decompressed without abnormal distension.  Stool filled colon.  No free air or free fluid in the abdomen.  Pelvis:  Diverticula in the sigmoid colon without diverticulitis. The appendix is normal.  The bladder is decompressed with Foley catheter.  No significant pelvic  lymphadenopathy.  Degenerative changes in the lumbar spine.  IMPRESSION: 7 mm obstructing stone in the proximal left ureter at the ureteropelvic junction.  Pararenal and periureteral stranding. Bilateral nonobstructing intrarenal stones.  Large esophageal hiatal hernia with much of the stomach above the diaphragm. Atelectasis or infiltration in both lung bases.   04/04/2012 - RENAL/URINARY TRACT ULTRASOUND COMPLETE  Findings:  Right Kidney:  10.8 cm in length.  No hydronephrosis.  8 mm right lower pole stone.  Negative for mass lesion.  Cortical echogenicity is normal.  Left Kidney:  12.2 cm in length.   15 mm rounded hypoechoic structure in the left renal hilum.  No mass lesion is seen in this area on the CT scan.  This could be blood in the collecting system or possibly an occult renal lesion.  No hydronephrosis.  Bladder:  Foley catheter with empty urinary  bladder.  IMPRESSION: 8 mm right renal stone without obstruction.  Left-sided hydronephrosis has improved since the prior CT.  15 mm hypoechoic round structure in the left renal hilum of uncertain significance.  This could be blood in the collecting system.        04/04/12 - Echo Study Conclusions: - Left ventricle: The cavity size was mildly dilated. Wall thickness was normal. Systolic function was severely reduced. The estimated ejection fraction was in the range of 20% to 25%. Diffuse hypokinesis. - Atrial septum: No defect or patent foramen ovale was identified  04/09/12 - Echo Study Conclusions:  - Left ventricle: The cavity size was normal. Systolic function was normal. The estimated ejection fraction was in the range of 60% to 65%. Wall motion was normal; there were no regional wall motion abnormalities.  - Left atrium: The atrium was mildly dilated. - Right ventricle: The cavity size was mildly dilated. Wall thickness was normal.   EKG: 04/03/12 @ 2156 - sinus tachycardia 139bpm, RAD, nonspecific ST/T changes  04/05/12 @ 1003 - A.Fib  w/ RVR 131bpm  04/09/12 @ 2206 - NSR 82bpm  Physical Exam: Blood pressure 121/51, pulse 89, temperature 98.1 F (36.7 C), temperature source Oral, resp. rate 27, height 5\' 4"  (1.626 m), weight 242 lb 11.6 oz (110.1 kg), SpO2 99.00%. General: Overweight white female intubated and sedated. Head: Normocephalic, atraumatic, sclera non-icteric, no xanthomas, nares are without discharge.  Neck: Supple. Neck exam limited by neck lines and habitus, no obvious JVD or bruit. Lungs: Clear with mechanical breath sounds. Breathing is unlabored on vent Heart: RRR with S1 S2. No murmurs, rubs, or gallops appreciated. Abdomen: Soft, non-distended with normoactive bowel sounds.  No rebound/guarding. No obvious abdominal masses. Msk:  Strength and tone appear normal for age. Extremities: Cyanotic toes. Trace to 1+ BLE edema.  Distal pedal pulses are intact and equal bilaterally.  Neuro: Intubated and sedated Psych:  Intubated and sedated   Assessment and Plan:  66 y.o. female w/ PMHx significant for HTN, nephrolithiasis and no prior cardiac history, who presented to [facility] on 04/03/2012 with complaints of left flank pain, shortness of breath, and confusion and was found to have E-coli bacteremia from pyelonephritis with obstructive uropathy.  1. Septic Shock 2/2 E-coli bacteremia from pyelonephritis with obstructive uropathy: Management per urology and primary team 2. Acute Respiratory Failure: Remains intubated. Management per primary team 3. Acute Encephalopathy: Head CT without acute intracranial findings. Management per neurology and primary team  4. NSTEMI: Likely demand ischemia in the setting of septic shock. Peak Troponin 1.49. No CK or MB checked. No acute ST/T changes on EKG. No WMAs on echo. No indication for ischemic evaluation. 5. Acute Systolic CHF: pBNP 99991111. EF 20-25% on 10/16. Likely 2/2 septic cardiomyopathy. Received 40mg  IV lasix x2. EF now improved at 60-65%, No WMAs.  6.  Atrial Fibrillation w/ RVR: New onset on 10/17 in the setting of uroseptic shock. Converted to NSR on 10/21 on Amiodarone. Currently on 400mg  po BID. CHADS2 score 1 (HTN). Currently on IV heparin. Given low CHADS2 score and anemia/thrombocytopenia and conversion to NSR would not recommend long term anticoagulation. Would continue 400mg  BID then transition to 200mg  daily at discharge for ~79mo.  7. Acute Renal Insufficiency: Improving. Peak Crt 2.74. 2.04 today. 8. Elevated LFTs: Likely 2/2 shock liver. Improving 9. Anemia: In the setting of septic shock. Management per primary team 10. Thrombocytopenia: In the setting of septic shock. Management per primary team 11. Hypokalemia: K+ 3.3. Receiving  supplementation 12. Hypernatremia: Na 148. Started on free water today.    Signed, HOPE, JESSICA PA-C 04/10/2012, 12:47 PM  History and all data above reviewed.  Patient examined.  I agree with the findings as above.  The patient is intubated and sedatedThe patient exam reveals COR:RRR  ,  Lungs: Clear  ,  Abd: Positive bowel sounds, no rebound no guarding, Ext No edema  .  All available labs, radiology testing, previous records reviewed. Agree with documented assessment and plan.  The patient had atrial fibrillation for about 5 days and also a septic cardiomyopathy that has since resolved.  She is now in NSR on po amiodarone.  I would suggest continuing the amiodarone as above.  However, I do not think that there is an indication for warfarin as she does have an increased bleeding and her CHADS score is only one.    Minus Breeding    04/10/2012

## 2012-04-10 NOTE — Progress Notes (Signed)
Name: [name] MRN: [id number] DOB: 06/06/1946    LOS: 7  Referring Provider:  Zenia Resides EDP Reason for Referral:  Septic shock  PULMONARY / CRITICAL CARE MEDICINE  Brief patient description:  This is a 66 y/o female admitted on 10/16 to [facility] with septic shock likely from pyelonephritis.  DDx includes an obstructing kidney stone given her history of nephrolithiasis as well as (less likely) severe CAP given hypoxemia and crackles in left base.   Lines: 10/15 R IJ (EDP) >> ETT:  10/15>>>   Cultures: 10/15 Urine >>e-coli pansensitive 10/15 Blood >>e-coli, pansensitive.  10/15 Urine st/leg ag>>neg 10/18: blood>> UA: 10/18: large leuks, large nitrites.  cdiff 10/21: neg  Antibiotics: 10/15 zosyn x1  10/15 vanc (severe cap) >>DC 10/15 ceftriaxone (pyelo, severe CAP) >>10/16 10/15 azithro (severe cap) >>10/16 10/16: levofloxacin >>>10/18 10/16: ceftaz>>>10/18 10/18 ceftriaxone (urine and blood cult pos ecoli)>>>10/22 rash  10/22 cipro>>>  Events Since Admission: 10/15 CT Ab/pelv (stone protocol) >> 10/16: Cystoscopy, Left Retrograde Pyelogram, with interpretation, and Left JJ stent ( 15F x 24 cm). 10/17- refractory shock, acidosis, fib, new dx septic cardiomyopathy likely 10/19 renal u/s>>no hydroneph 10/20 CT head>>NEg   Current Status: Sedated on propofol gtt  Vital Signs: Temp:  [97.5 F (36.4 C)-98.3 F (36.8 C)] 98.1 F (36.7 C) (10/22 0818) Pulse Rate:  [77-93] 89  (10/22 1121) Resp:  [17-27] 27  (10/22 1121) BP: (92-123)/(39-65) 121/51 mmHg (10/22 1121) SpO2:  [94 %-100 %] 99 % (10/22 1121) FiO2 (%):  [40 %] 40 % (10/22 1122) Weight:  [110.1 kg (242 lb 11.6 oz)] 110.1 kg (242 lb 11.6 oz) (10/22 0500)    . sodium chloride 500 mL (04/10/12 1154)  . heparin 2,200 Units/hr (04/10/12 1154)  . propofol 20 mcg/kg/min (04/10/12 1155)    Intake/Output Summary (Last 24 hours) at 04/10/12 1211 Last data filed at 04/10/12 1100  Gross per 24 hour  Intake 2132.48 ml   Output   2250 ml  Net -117.52 ml   Physical Examination: Gen: intubated, agitated HEENT: NCAT, MM dry, PERRL, EOMi PULM: diffuse rhonchi, tactile fremitus  CV: Tachy, irregular, distant heart sounds AB: BS+, soft, nontender, no masses, no guarding or rebound Ext: warm feet compared to yesterday. Blue discoloration present in feet and fingers.  Rash on legs and trunk  Neuro: doesn't follow command. Encephalopathic    Lab 04/10/12 0440 04/09/12 0500 04/08/12 0452  NA 148* 143 141  K 3.3* 3.1* 3.5  CL 115* 111 110  CO2 22 23 21   BUN 68* 68* 67*  CREATININE 2.04* 2.28* 2.47*  GLUCOSE 108* 119* 120*    Lab 04/10/12 0440 04/09/12 0500 04/08/12 0452  HGB 10.3* 10.9* 11.1*  HCT 30.0* 31.4* 32.4*  WBC 24.7* 24.8* 22.7*  PLT 109* 84* 67*     CXR:  Cardiomegally, bibasilar atelectasis, less edema, improved aeration   ASSESSMENT AND PLAN 1) Acute respiratory failure in setting of sepsis related ALI P:   - currently on PRVC, 40% FiO2, 5 PEEP. Tolerated PS mode. Will not attempt to extubate given neuro status.  - cycle psv today -will likely need trach    2) NSTEMI vs demand ischemia,  w/ Echo: EF: 20-25% and clinical scenario Septic cardiomyopathy more likely etiology  New onset a-fib 10/17: loaded with amio, now on amio gtt and heparin   Lab 04/05/12 0035 04/04/12 1807 04/04/12 1230  CKTOTAL -- -- --  CKMB -- -- --  TROPONINI 1.49* 1.49* 1.12*  P:  -amiodarone 400mg   bid per tube -cont hep drip -will need f/u echo eventually  -cards would be helpful for further recs   3) Septic shock (resolved): in setting of E-coli bacteremia from pyelonephritis with obstructive uropathy;  Now improved, shock resolved.  P:   -cont  ceftraixone.  Continue to monitor for now - f/u repeat BC  4) Acute renal failure and ATN from shock, increased urine output 10/19: possibly from post ATN diuresis 5) History of nephrolithiasis now with L pyelonephritis: CT: impacted 44mm left upper  ureteral stone at [facility].  - left JJ stent placed.  - no evidence of hydronephrosis on repeat US 10/16 Recent Labs  Basename 04/10/12 0440 04/09/12 0500 04/08/12 0452   CREATININE 2.04* 2.28* 2.47*  - improving creatinine and making urine Plan: - BMP in AM  6) Hypokalemia   Lab 04/10/12 0440 04/09/12 0500 04/08/12 0452  K 3.3* 3.1* 3.5  P: Replace and recheck   7) hypernatremia  Lab 04/10/12 0440 04/09/12 0500 04/08/12 0452  NA 148* 143 141  Plan: -replace free water -f/u chemistry  8) Elevated LFT's, likely from shock liver: improving  Lab 04/10/12 0440 04/08/12 0452 04/07/12 0410  ALT 70* 108* 145*  AST 53* 77* 98*  GGT -- -- --  ALKPHOS 270* 255* 288*  BILITOT 0.3 0.2* 0.4  P:  - LFT's improving  9) Diarrhea  -stop PPI, change to H2 blocker for diarrhea  10) Thrombocytopenia, likely from E-coli urosepsis, w/ hematuria.  Improving.   Lab 04/10/12 0440 04/09/12 0500 04/08/12 0452  PLT 109* 84* 67*  P:  -monitor platelets on heparin as they are trending down. If worsening drop and evidence of bleeding, stop heparin.   11) Acute encephalopathy from sepsis, not  improving ?CVA, very agitated this AM Head CT: no acute process.  P:   -on propofol and on MIND [place] trial: haldol vs zirprazidone vs placebo.  - neurology recommends supportive care   12) protein cal malnutrition  P: Cont tube feeds  13) raised erythremic rash: possible drug reaction?  P: -will try stopping beta-lactam -also consider other possible drug related reaction  BEST PRACTICE / DISPOSITION Level of Care:  ICU Primary Service:  PCCM Consultants:  Urology: signed out 10/17 Code Status:  full Diet:  TF DVT Px: hep IV fib 10/18>>> GI Px:  Zantac  Skin Integrity:  normal Social / Family:  Updated daughter at bedside daily   She was admitted for E coli UTI/bactermia in setting of nephrolithiasis.  Hemodynamics have improved, and tolerating pressure support.  Mental status has been barrier  to liberation from vent.  Mental status change likely from sepsis induced encephalopathy.    She was started on amiodarone for A fib, but now in sinus rhythm.  Will ask cardiology to address issues of long term amiodarone therapy and need for long term anti-coagulation.  She also has skin rash on Rt upper leg and Rt flank>>?if from drug reaction.  Will adjust Abx.  I spent 35 minutes personally reviewing data, examining pt, and formulating treatment plan.  Chesley Mires, MD [facility] Pulmonary/Critical Care 04/10/2012, 12:36 PM Pager:  [phone] After 3pm call: [phone]

## 2012-04-10 NOTE — Progress Notes (Signed)
[facility] ADULT ICU REPLACEMENT PROTOCOL FOR AM LAB REPLACEMENT ONLY  The patient does not apply for the [facility] Adult ICU Electrolyte Replacment Protocol based on the criteria listed below:    Is BUN < 30 mg/dL? no  Patient's BUN today is 68 4. Abnormal electrolyte(s):K3.3   Vear Clock 04/10/2012 6:43 AM

## 2012-04-10 NOTE — Consult Note (Signed)
WOC consult Note Reason for Consult:Consult requested for fissures under breasts. Appearance is consistent with intertrigo from moisture. Pt with high BMI and difficult to separate skin folds when in bed/ Wound type: Partial thickness split areas under each breast, approx  .2X15X.1cm Wound bed: 100% red Drainage (amount, consistency, odor) Small pink drainage. Periwound: Intact surrounding. Dressing procedure/placement/frequency: Interdry silver-impregnated fabric to wick away drainage and provide antimicrobial benefits.  This should remain in place for 5 days.  Directions left at bedside for staff nurse use. Will not plan to follow further unless re-consulted.  [address] [place], MSN, [place]

## 2012-04-10 NOTE — Progress Notes (Signed)
Interdry applied as ordered to underneath bilateral breasts.

## 2012-04-10 NOTE — Progress Notes (Signed)
EEG COMPLETED

## 2012-04-10 NOTE — Progress Notes (Signed)
ANTICOAGULATION CONSULT NOTE - Follow Up Consult  Pharmacy Consult for Heparin Indication: Atrial fibrillation  No Known Allergies  Patient Measurements: Height: 5\' 4"  (162.6 cm) Weight: 242 lb 11.6 oz (110.1 kg) IBW/kg (Calculated) : 54.7  Heparin Dosing Weight: 82 kg  Vital Signs: Temp: 98.1 F (36.7 C) (10/22 0818) Temp src: Oral (10/22 0818) BP: 110/45 mmHg (10/22 0738) Pulse Rate: 82  (10/22 0738)  Labs:  Basename 04/10/12 0440 04/09/12 0500 04/08/12 0452  HGB 10.3* 10.9* --  HCT 30.0* 31.4* 32.4*  PLT 109* 84* 67*  APTT -- -- 90*  LABPROT 14.8 -- 14.9  INR 1.18 -- 1.19  HEPARINUNFRC 0.64 0.58 0.31  CREATININE 2.04* 2.28* 2.47*  CKTOTAL -- -- --  CKMB -- -- --  TROPONINI -- -- --    Estimated Creatinine Clearance: 32.5 ml/min (by C-G formula based on Cr of 2.04).   Assessment: 66 yo female with atrial fibrillation CVR on amiodarone and heparin drip 2200 units/hr. The heparin level 0.64 is at goal, but is still trending up despite the rate reduction from yesterday. H/H stable, platelets low but trending up. No reported bleeding.   Goal of Therapy:  Heparin 0.3-0.7 Monitor platelets by anticoagulation protocol: Yes   Plan:  1. Decrease heparin to 2100 units/hour 2. Monitor daily HL and CBC  Nicoletta Ba PharmD Candidate 04/10/2012,8:31 AM  Discussed with student.  Agree with plan. Bonnita Nasuti Pharm.D. CPP, BCPS Clinical Pharmacist [phone] 04/10/2012 2:25 PM

## 2012-04-10 NOTE — Progress Notes (Signed)
[facility] PROGRESS NOTE    SUBJECTIVE   No response to verbal or tactile stimuli.  She is intubated and on Propofol drip.   OBJECTIVE   Vital signs in last 24 hours: Temp:  [97.5 F (36.4 C)-98.3 F (36.8 C)] 98.1 F (36.7 C) (10/22 0818) Pulse Rate:  [77-93] 86  (10/22 0800) Resp:  [17-27] 22  (10/22 0900) BP: (92-123)/(39-65) 111/51 mmHg (10/22 0900) SpO2:  [94 %-100 %] 96 % (10/22 0800) FiO2 (%):  [40 %] 40 % (10/22 0738) Weight:  [110.1 kg (242 lb 11.6 oz)] 110.1 kg (242 lb 11.6 oz) (10/22 0500)  Intake/Output from previous day: 10/21 0701 - 10/22 0700 In: 2259.9 [I.V.:1549.9; NG/GT:660; IV Piggyback:50] Out: 2300 [Urine:1275; Stool:1025] Intake/Output this shift: Total I/O In: 118.8 [I.V.:68.8; NG/GT:50] Out: 125 [Urine:125] Nutritional status:    Past Medical History  Diagnosis Date  . Hypertension   . Pneumonia   . GERD (gastroesophageal reflux disease)   . Headache      Neurologic Exam:  Mental Status: No response to verbal or tactile stimuli. On propofol drip.  Cranial Nerves: II: No response to threat, pupils equal, round, reactive to light  III,IV, VI: ptosis not present, extra-ocular motions intact bilaterally V,VII: face appears symmetric, VIII: no response to verbal stimuli IX,X: gag reflex present  Motor: All extremities flaccid.  Sensory: No response to painfull  Deep Tendon Reflexes: 2+ and symmetric throughout Plantars: Mute bilaterally  CV: pulses palpable throughout     Lab Results: Results for orders placed during the hospital encounter of 04/03/12 (from the past 24 hour(s))  CLOSTRIDIUM DIFFICILE BY PCR     Status: Normal   Collection Time   04/09/12 11:39 AM      Component Value Range   C difficile by pcr NEGATIVE  NEGATIVE  GLUCOSE, CAPILLARY     Status: Abnormal   Collection Time   04/09/12 12:42 PM      Component Value Range   Glucose-Capillary 105 (*) 70 - 99 mg/dL    LACTIC ACID, PLASMA     Status: Normal   Collection Time   04/09/12  3:30 PM      Component Value Range   Lactic Acid, Venous 0.9  0.5 - 2.2 mmol/L  GLUCOSE, CAPILLARY     Status: Normal   Collection Time   04/09/12  3:42 PM      Component Value Range   Glucose-Capillary 98  70 - 99 mg/dL  AMMONIA     Status: Normal   Collection Time   04/09/12  4:05 PM      Component Value Range   Ammonia 29  11 - 60 umol/L  GLUCOSE, CAPILLARY     Status: Abnormal   Collection Time   04/09/12  8:11 PM      Component Value Range   Glucose-Capillary 101 (*) 70 - 99 mg/dL  GLUCOSE, CAPILLARY     Status: Abnormal   Collection Time   04/09/12 11:50 PM      Component Value Range   Glucose-Capillary 122 (*) 70 - 99 mg/dL   Comment 1 Documented in Chart     Comment 2 Notify RN    GLUCOSE, CAPILLARY     Status: Abnormal   Collection Time   04/10/12  3:45 AM      Component Value Range   Glucose-Capillary 106 (*) 70 - 99 mg/dL   Comment 1 Documented in Chart     Comment 2 Notify RN    HEPARIN LEVEL (UNFRACTIONATED)     Status: Normal   Collection Time   04/10/12  4:40 AM      Component Value Range   Heparin Unfractionated 0.64  0.30 - 0.70 IU/mL  COMPREHENSIVE METABOLIC PANEL     Status: Abnormal   Collection Time   04/10/12  4:40 AM      Component Value Range   Sodium 148 (*) 135 - 145 mEq/L   Potassium 3.3 (*) 3.5 - 5.1 mEq/L   Chloride 115 (*) 96 - 112 mEq/L   CO2 22  19 - 32 mEq/L   Glucose, Bld 108 (*) 70 - 99 mg/dL   BUN 68 (*) 6 - 23 mg/dL   Creatinine, Ser 2.04 (*) 0.50 - 1.10 mg/dL   Calcium 8.4  8.4 - 10.5 mg/dL   Total Protein 5.1 (*) 6.0 - 8.3 g/dL   Albumin 1.7 (*) 3.5 - 5.2 g/dL   AST 53 (*) 0 - 37 U/L   ALT 70 (*) 0 - 35 U/L   Alkaline Phosphatase 270 (*) 39 - 117 U/L   Total Bilirubin 0.3  0.3 - 1.2 mg/dL   GFR calc non Af Amer 24 (*) >90 mL/min   GFR calc Af Amer 28 (*) >90 mL/min  CBC     Status: Abnormal   Collection Time   04/10/12  4:40 AM      Component  Value Range   WBC 24.7 (*) 4.0 - 10.5 K/uL   RBC 3.62 (*) 3.87 - 5.11 MIL/uL   Hemoglobin 10.3 (*) 12.0 - 15.0 g/dL   HCT 30.0 (*) 36.0 - 46.0 %   MCV 82.9  78.0 - 100.0 fL   MCH 28.5  26.0 - 34.0 pg   MCHC 34.3  30.0 - 36.0 g/dL   RDW 16.2 (*) 11.5 - 15.5 %   Platelets 109 (*) 150 - 400 K/uL  PROTIME-INR     Status: Normal   Collection Time   04/10/12  4:40 AM      Component Value Range   Prothrombin Time 14.8  11.6 - 15.2 seconds   INR 1.18  0.00 - 1.49  GLUCOSE, CAPILLARY     Status: Abnormal   Collection Time   04/10/12  8:13 AM      Component Value Range   Glucose-Capillary 106 (*) 70 - 99 mg/dL   Lipid Panel No results found for this basename: CHOL,TRIG,HDL,CHOLHDL,VLDL,LDLCALC in the last 72 hours  Studies/Results: Ct Head Wo Contrast  04/08/2012  *RADIOLOGY REPORT*  Clinical Data: Encephalopathy.  Sepsis.  Rule out CVA  CT HEAD WITHOUT CONTRAST  Technique:  Contiguous axial images were obtained from the base of the skull through the vertex without contrast.  Comparison: None.  Findings: Age appropriate atrophy.  Negative for acute infarct. Negative for hemorrhage or mass.  No edema in the brain.  No fluid collection is identified.  Calvarium is intact.  Mucosal edema in the ethmoid sinuses.  IMPRESSION: No acute intracranial abnormality.  Mild chronic sinusitis.   Original Report Authenticated By: Truett Perna, M.D.    Dg Chest Port 1 View  04/10/2012  *RADIOLOGY REPORT*  Clinical Data: Evaluate endotracheal tube position.  Monitor pleural effusion and atelectasis versus consolidation.  PORTABLE CHEST - 1 VIEW  Comparison:  04/09/2012  Findings: Endotracheal tube terminates 2.8 cm above carina. Nasogastric tube extends beyond the  inferior aspect of the film. Right IJ central line tip at right brachycephalic vein.  Cardiomegaly accentuated by AP portable technique.  Similar small left pleural effusion. No pneumothorax.  Low lung volumes with superimposed mild interstitial  edema.  Minimally improved. Improved right base aeration with persistent left greater than right bibasilar airspace disease.  IMPRESSION:  1.  Slightly improved aeration, with decreased congestive heart failure and right base air space disease. 2.  Small left pleural effusion and left base atelectasis or infection remaining. 3.  Endotracheal tube appropriately position.   Original Report Authenticated By: Areta Haber, M.D.    Dg Chest Port 1 View  04/09/2012  *RADIOLOGY REPORT*  Clinical Data: Evaluate endotracheal tube.  PORTABLE CHEST - 1 VIEW  Comparison: Chest x-ray 04/08/2012.  Findings: An endotracheal tube is in place with tip 2.1 cm above the carina. A nasogastric tube is seen extending into the stomach, however, the tip of the nasogastric tube extends below the lower margin of the image.  Lung volumes are low.  Bibasilar opacities (left greater than right) may reflect areas of atelectasis and/or consolidation, with superimposed bilateral pleural effusions (small right and moderate on the left).  Crowding of the pulmonary vasculature, accentuated by low lung volumes, without frank pulmonary edema.  Heart size is mildly enlarged. The patient is rotated to the left on today's exam, resulting in distortion of the mediastinal contours and reduced diagnostic sensitivity and specificity for mediastinal pathology.  IMPRESSION: 1.  Support apparatus, as above. 2.  Decreasing lung volumes with worsening bibasilar aeration which likely reflects a combination of atelectasis and/or consolidation with superimposed bilateral pleural effusions (small on the right and moderate on the left). 3.  Mild cardiomegaly is unchanged.   Original Report Authenticated By: Etheleen Mayhew, M.D.     Medications:     Scheduled:   . amiodarone  400 mg Oral BID  . antiseptic oral rinse  15 mL Mouth Rinse QID  . cefTRIAXone (ROCEPHIN)  IV  1 g Intravenous Q24H  . chlorhexidine  15 mL Mouth Rinse BID  . clonazePAM  1 mg  Oral Q8H  . feeding supplement (OXEPA)  1,000 mL Per Tube Q24H  . feeding supplement  60 mL Per Tube TID  . MIND-USA study drug  (PI-Feinstein)  2 mL Intravenous Q12H  . MIND-USA study drug  (PI-Feinstein)  1 mL Intravenous Once  . multivitamin  5 mL Per Tube Daily  . potassium chloride  40 mEq Oral Once  . potassium chloride  40 mEq Oral Once  . potassium chloride      . ranitidine  150 mg Per Tube QHS  . DISCONTD: MIND-USA study drug  (PI-Feinstein)  1 mL Intravenous Q12H    Assessment/Plan:    Patient Active Hospital Problem List:  Acute encephalopathy (04/04/2012)   Assessment: Patient currently on propofol drip.  WBC 24.7, AST/ALT 53/73, ammonia 29, lactic acid 0.9.    Plan: Continue to treat underlying metabolic and infectious sources. Will continue to follow.    Etta Quill PA-C [facility] [phone]  04/10/2012, 9:46 AM

## 2012-04-11 ENCOUNTER — Encounter (HOSPITAL_COMMUNITY): Payer: Self-pay | Admitting: Radiology

## 2012-04-11 ENCOUNTER — Inpatient Hospital Stay (HOSPITAL_COMMUNITY): Payer: Medicare Other

## 2012-04-11 LAB — GLUCOSE, CAPILLARY
Glucose-Capillary: 149 mg/dL — ABNORMAL HIGH (ref 70–99)
Glucose-Capillary: 105 mg/dL — ABNORMAL HIGH (ref 70–99)
Glucose-Capillary: 112 mg/dL — ABNORMAL HIGH (ref 70–99)
Glucose-Capillary: 102 mg/dL — ABNORMAL HIGH (ref 70–99)
Glucose-Capillary: 105 mg/dL — ABNORMAL HIGH (ref 70–99)
Glucose-Capillary: 108 mg/dL — ABNORMAL HIGH (ref 70–99)
Glucose-Capillary: 102 mg/dL — ABNORMAL HIGH (ref 70–99)

## 2012-04-11 LAB — COMPREHENSIVE METABOLIC PANEL
ALT: 57 U/L — ABNORMAL HIGH (ref 0–35)
AST: 37 U/L (ref 0–37)
Albumin: 1.8 g/dL — ABNORMAL LOW (ref 3.5–5.2)
Alkaline Phosphatase: 195 U/L — ABNORMAL HIGH (ref 39–117)
BUN: 63 mg/dL — ABNORMAL HIGH (ref 6–23)
CO2: 19 mEq/L (ref 19–32)
Calcium: 8.2 mg/dL — ABNORMAL LOW (ref 8.4–10.5)
Chloride: 114 mEq/L — ABNORMAL HIGH (ref 96–112)
Creatinine, Ser: 1.93 mg/dL — ABNORMAL HIGH (ref 0.50–1.10)
GFR calc Af Amer: 30 mL/min — ABNORMAL LOW (ref >90)
GFR calc non Af Amer: 26 mL/min — ABNORMAL LOW (ref >90)
Glucose, Bld: 104 mg/dL — ABNORMAL HIGH (ref 70–99)
Potassium: 3.6 mEq/L (ref 3.5–5.1)
Sodium: 145 mEq/L (ref 135–145)
Total Bilirubin: 0.2 mg/dL — ABNORMAL LOW (ref 0.3–1.2)
Total Protein: 5.2 g/dL — ABNORMAL LOW (ref 6.0–8.3)

## 2012-04-11 LAB — BLOOD GAS, ARTERIAL
Acid-base deficit: 4.7 mmol/L — ABNORMAL HIGH (ref 0.0–2.0)
Bicarbonate: 19.3 mEq/L — ABNORMAL LOW (ref 20.0–24.0)
Drawn by: 36277
FIO2: 0.4 %
MECHVT: 490 mL
O2 Saturation: 98.1 %
PEEP: 5 cmH2O
Patient temperature: 98.6
RATE: 24 resp/min
TCO2: 20.3 mmol/L (ref 0–100)
pCO2 arterial: 32.2 mmHg — ABNORMAL LOW (ref 35.0–45.0)
pH, Arterial: 7.396 (ref 7.350–7.450)
pO2, Arterial: 82.9 mmHg (ref 80.0–100.0)

## 2012-04-11 LAB — HEPARIN LEVEL (UNFRACTIONATED): Heparin Unfractionated: 0.77 IU/mL — ABNORMAL HIGH (ref 0.30–0.70)

## 2012-04-11 LAB — TRIGLYCERIDES: Triglycerides: 279 mg/dL — ABNORMAL HIGH (ref <150)

## 2012-04-11 LAB — CBC
HCT: 29.6 % — ABNORMAL LOW (ref 36.0–46.0)
Hemoglobin: 10.3 g/dL — ABNORMAL LOW (ref 12.0–15.0)
MCH: 29.7 pg (ref 26.0–34.0)
MCHC: 34.8 g/dL (ref 30.0–36.0)
MCV: 85.3 fL (ref 78.0–100.0)
Platelets: 149 10*3/uL — ABNORMAL LOW (ref 150–400)
RBC: 3.47 MIL/uL — ABNORMAL LOW (ref 3.87–5.11)
RDW: 17.3 % — ABNORMAL HIGH (ref 11.5–15.5)
WBC: 24.5 10*3/uL — ABNORMAL HIGH (ref 4.0–10.5)

## 2012-04-11 MED ORDER — CLONAZEPAM 0.5 MG PO TABS: 0.5000 mg | ORAL_TABLET | Freq: Two times a day (BID) | ORAL | Status: DC

## 2012-04-11 MED ORDER — HEPARIN SODIUM (PORCINE) 5000 UNIT/ML IJ SOLN
5000.0000 [IU] | Freq: Three times a day (TID) | INTRAMUSCULAR | Status: DC
  Administered 2012-04-11 – 2012-04-17 (×19): 5000 [IU] via SUBCUTANEOUS
  Filled 2012-04-11 (×21): qty 1

## 2012-04-11 MED ORDER — IOHEXOL 300 MG/ML  SOLN
5.0000 mL | Freq: Once | INTRAMUSCULAR | Status: AC | PRN
  Administered 2012-04-11: 5 mL via INTRAVENOUS

## 2012-04-11 MED ORDER — ALBUTEROL SULFATE (5 MG/ML) 0.5% IN NEBU: 2.5000 mg | INHALATION_SOLUTION | RESPIRATORY_TRACT | Status: DC | PRN

## 2012-04-11 MED ORDER — CLONAZEPAM 0.5 MG PO TABS
0.5000 mg | ORAL_TABLET | Freq: Every day | ORAL | Status: DC
  Administered 2012-04-11: 0.5 mg via ORAL
  Filled 2012-04-11: qty 1

## 2012-04-11 NOTE — Progress Notes (Signed)
UR Completed.   Vergie Living [id number] 04/11/2012

## 2012-04-11 NOTE — Progress Notes (Signed)
  ANTICOAGULATION CONSULT NOTE - Follow Up Consult  Pharmacy Consult for Heparin Indication: atrial fibrillation  No Known Allergies  Patient Measurements: Height: 5\' 4"  (162.6 cm) Weight: 244 lb 0.8 oz (110.7 kg) IBW/kg (Calculated) : 54.7  Heparin Dosing Weight: 82 kg  Vital Signs: Temp: 98.6 F (37 C) (10/23 0420) Temp src: Oral (10/23 0420) BP: 117/45 mmHg (10/23 0600) Pulse Rate: 75  (10/23 0600)  Labs:  Basename 04/11/12 0420 04/10/12 0440 04/09/12 0500  HGB 10.3* 10.3* --  HCT 29.6* 30.0* 31.4*  PLT 149* 109* 84*  APTT -- -- --  LABPROT -- 14.8 --  INR -- 1.18 --  HEPARINUNFRC 0.77* 0.64 0.58  CREATININE 1.93* 2.04* 2.28*  CKTOTAL -- -- --  CKMB -- -- --  TROPONINI -- -- --    Estimated Creatinine Clearance: 34.4 ml/min (by C-G formula based on Cr of 1.93).  Assessment: 66 yo female with atrial fibrillation CVR on amiodarone and heparin drip 2100 units/hr. The heparin level is 0.77, which is supratherapeutic, despite rate reduction on 10/22. H/H stable and platelets are trending back up (149). No reported bleeding.    Goal of Therapy:  Heparin level 0.3-0.7 Monitor platelets by anticoagulation protocol: Yes   Plan:  1. Decrease heparin to 1900 units/hour 2. Obtain heparin level at 1500 3. Monitor daily heparin level and CBC  Nicoletta Ba PharmD Candidate 04/11/2012,8:04 AM

## 2012-04-11 NOTE — Progress Notes (Signed)
Nutrition Follow-up  Intervention:    Before advancing diet, consider swallow evaluation with SLP to determine safest diet consistency.  If unable to safely advance PO diet, recommend resume TF via NG tube with Jevity 1.2 at 25 ml/h, increase by 10 ml every 4 hours to goal rate of 65 ml/h with Prostat 30 ml once daily to provide 1972 kcals, 102 gm protein, 1264 ml free water daily.  Assessment:   Patient was extubated early this morning.  Remains NPO.  NG tube was removed post-extubation and RN was unable to replace this morning.  There is an order for fluoro to assist with placing NG tube.  Diet Order:  NPO  Meds: Scheduled Meds:    . amiodarone  400 mg Oral BID  . antiseptic oral rinse  15 mL Mouth Rinse QID  . chlorhexidine  15 mL Mouth Rinse BID  . ciprofloxacin  400 mg Intravenous Q12H  . clonazePAM  0.5 mg Oral QHS  . free water  200 mL Per Tube Q6H  . heparin subcutaneous  5,000 Units Subcutaneous Q8H  . MIND-USA study drug  (PI-Feinstein)  2 mL Intravenous Q12H  . multivitamin  5 mL Per Tube Daily  . DISCONTD: clonazePAM  0.5 mg Oral BID  . DISCONTD: clonazePAM  1 mg Oral Q8H  . DISCONTD: feeding supplement (OXEPA)  1,000 mL Per Tube Q24H  . DISCONTD: feeding supplement  60 mL Per Tube TID  . DISCONTD: ranitidine  150 mg Per Tube QHS   Continuous Infusions:    . sodium chloride 500 mL (04/10/12 1154)  . DISCONTD: heparin Stopped (04/11/12 0920)  . DISCONTD: propofol Stopped (04/11/12 0820)   PRN Meds:.albuterol, fentaNYL, iohexol, DISCONTD: albuterol  Labs:  CMP     Component Value Date/Time   NA 145 04/11/2012 0420   K 3.6 04/11/2012 0420   CL 114* 04/11/2012 0420   CO2 19 04/11/2012 0420   GLUCOSE 104* 04/11/2012 0420   BUN 63* 04/11/2012 0420   CREATININE 1.93* 04/11/2012 0420   CALCIUM 8.2* 04/11/2012 0420   PROT 5.2* 04/11/2012 0420   ALBUMIN 1.8* 04/11/2012 0420   AST 37 04/11/2012 0420   ALT 57* 04/11/2012 0420   ALKPHOS 195* 04/11/2012 0420   BILITOT 0.2* 04/11/2012 0420   GFRNONAA 26* 04/11/2012 0420   GFRAA 30* 04/11/2012 0420    CBG (last 3)   Basename 04/11/12 1146 04/11/12 1001 04/11/12 0758  GLUCAP 105* 102* 112*    Sodium  Date/Time Value Range Status  04/11/2012  4:20 AM 145  135 - 145 mEq/L Final  04/10/2012  4:40 AM 148* 135 - 145 mEq/L Final  04/09/2012  5:00 AM 143  135 - 145 mEq/L Final    Potassium  Date/Time Value Range Status  04/11/2012  4:20 AM 3.6  3.5 - 5.1 mEq/L Final  04/10/2012  4:40 AM 3.3* 3.5 - 5.1 mEq/L Final  04/09/2012  5:00 AM 3.1* 3.5 - 5.1 mEq/L Final    Phosphorus  Date/Time Value Range Status  04/04/2012  4:28 AM 4.2  2.3 - 4.6 mg/dL Final  04/04/2012  2:10 AM 2.2* 2.3 - 4.6 mg/dL Final    Magnesium  Date/Time Value Range Status  04/06/2012  4:09 AM 2.3  1.5 - 2.5 mg/dL Final  04/05/2012 12:16 PM 2.1  1.5 - 2.5 mg/dL Final  04/04/2012  4:28 AM 1.2* 1.5 - 2.5 mg/dL Final      Intake/Output Summary (Last 24 hours) at 04/11/12 1444 Last data filed at 04/11/12  1400  Gross per 24 hour  Intake 2735.2 ml  Output   2350 ml  Net  385.2 ml    Weight Status:  110.7 kg trending back down   Estimated needs:  1800-2000 kcals, 95-110 gm protein, 2-2.2 liters fluid daily  Nutrition Dx: Inadequate oral intake related to inability to eat as evidenced by NPO status; ongoing.  New Goal:  Intake to meet >90% of estimated nutrition needs, unmet due to NPO status.  Monitor: Diet advancement, PO intake, weight trend, labs, I/O   Molli Barrows, RD, LDN, CNSC Pager# [phone] After Hours Pager# [phone]

## 2012-04-11 NOTE — Procedures (Signed)
Extubation Procedure Note  Patient Details:   Name: [name] DOB: Mar 30, 1946 MRN: [id number]   Airway Documentation:     Evaluation  O2 sats: stable throughout Complications: No apparent complications Patient did tolerate procedure well. Bilateral Breath Sounds: Rhonchi;Diminished Suctioning: Airway Yes   Benjamine Sprague 04/11/2012, 9:17 AM

## 2012-04-11 NOTE — Progress Notes (Signed)
[name] was started on IV heparin for acute Afib. Noted CHADS2=1, patient currently in NSR. Heparin IV infusion was discontinued today on rounds (Stacey's assessment/plan for IV heparin was otherwise appropriate), and SQ heparin initiated for VTE prophylaxis. Order for daily heparin levels has been d/c.  Thanks, Latrish Mogel K. Posey Pronto, PharmD, BCPS.  Clinical Pharmacist Pager [phone]. 04/11/2012 11:11 AM

## 2012-04-11 NOTE — Progress Notes (Signed)
Subjective: Patient off sedation and extubated.  Lactic acid and ammonia normal. EEG shows general background slowing which is consistent whit the patient's current clinical status.    Objective: Current vital signs: BP 120/84  Pulse 91  Temp 97.8 F (36.6 C) (Axillary)  Resp 26  Ht 5\' 4"  (1.626 m)  Wt 110.7 kg (244 lb 0.8 oz)  BMI 41.89 kg/m2  SpO2 100% Vital signs in last 24 hours: Temp:  [97.8 F (36.6 C)-99.4 F (37.4 C)] 97.8 F (36.6 C) (10/23 1000) Pulse Rate:  [75-92] 91  (10/23 1000) Resp:  [20-45] 26  (10/23 1000) BP: (103-123)/(41-84) 120/84 mmHg (10/23 1000) SpO2:  [92 %-100 %] 100 % (10/23 1000) FiO2 (%):  [40 %] 40 % (10/23 0814) Weight:  [110.7 kg (244 lb 0.8 oz)] 110.7 kg (244 lb 0.8 oz) (10/23 0500)  Intake/Output from previous day: 10/22 0701 - 10/23 0700 In: 3211.5 [I.V.:1471.5; NG/GT:1290; IV Piggyback:450] Out: 2250 [Urine:1650; Stool:600] Intake/Output this shift: Total I/O In: 60 [I.V.:60] Out: 300 [Urine:300] Nutritional status:    Neurologic Exam: Mental Status: Patient awake and alert.  No speech.  Does not follow commands. Cranial Nerves: II: Discs flat bilaterally; Pupils equal, round, reactive to light and accommodation III,IV, VI: ptosis not present, extra-ocular motions intact bilaterally V,VII: Grimace symmetric VIII: unable to test IX,X: gag reflex present XI: bilateral shoulder shrug XII: unable to test Motor: Moves all extremities spontaneously with no frank asymmetry noted. Tone and bulk:normal tone throughout; no atrophy noted Plantars: Right: upgoing   Left: upgoing Cerebellar: Unable to test CV: pulses palpable throughout   Lab Results: Basic Metabolic Panel:  Lab [id number] 0420 04/10/12 0440 04/09/12 0500 04/08/12 0452 04/07/12 1450 04/06/12 0409 04/05/12 1216  NA 145 148* 143 141 138 -- --  K 3.6 3.3* 3.1* 3.5 3.1* -- --  CL 114* 115* 111 110 107 -- --  CO2 19 22 23 21 21  -- --  GLUCOSE 104* 108* 119* 120* 113* --  --  BUN 63* 68* 68* 67* 64* -- --  CREATININE 1.93* 2.04* 2.28* 2.47* 2.59* -- --  CALCIUM 8.2* 8.4 8.3* -- -- -- --  MG -- -- -- -- -- 2.3 2.1  PHOS -- -- -- -- -- -- --    Liver Function Tests:  Lab 04/11/12 0420 04/10/12 0440 04/08/12 0452 04/07/12 0410 04/05/12 0436  AST 37 53* 77* 98* 231*  ALT 57* 70* 108* 145* 247*  ALKPHOS 195* 270* 255* 288* 143*  BILITOT 0.2* 0.3 0.2* 0.4 0.7  PROT 5.2* 5.1* 4.8* 5.0* 4.6*  ALBUMIN 1.8* 1.7* 1.6* 1.8* 1.9*   No results found for this basename: LIPASE:5,AMYLASE:5 in the last 168 hours  Lab 04/09/12 1605  AMMONIA 29    CBC:  Lab 04/11/12 0420 04/10/12 0440 04/09/12 0500 04/08/12 0452 04/07/12 1450 04/05/12 0435  WBC 24.5* 24.7* 24.8* 22.7* 21.4* --  NEUTROABS -- -- -- -- -- 23.5*  HGB 10.3* 10.3* 10.9* 11.1* 11.2* --  HCT 29.6* 30.0* 31.4* 32.4* 32.2* --  MCV 85.3 82.9 82.2 82.2 81.5 --  PLT 149* 109* 84* 67* 59* --    Cardiac Enzymes:  Lab 04/05/12 0035 04/04/12 1807 04/04/12 1230  CKTOTAL -- -- --  CKMB -- -- --  CKMBINDEX -- -- --  TROPONINI 1.49* 1.49* 1.12*    Lipid Panel:  Lab 04/11/12 0420  CHOL --  TRIG 279*  HDL --  CHOLHDL --  VLDL --  LDLCALC --    CBG:  Lab 04/11/12  [id number] 04/10/12 2329 04/10/12 2022 04/10/12 1609 04/10/12 1227  GLUCAP 105* 149* 108* 103* 84    Microbiology: Results for orders placed during the hospital encounter of 04/03/12  URINE CULTURE     Status: Normal   Collection Time   04/03/12 10:19 PM      Component Value Range Status Comment   Specimen Description URINE, RANDOM   Final    Special Requests CX ADDED AT 0332    Final    Culture  Setup Time 04/04/2012 03:42   Final    Colony Count >=100,000 COLONIES/ML   Final    Culture ESCHERICHIA COLI   Final    Report Status 04/05/2012 FINAL   Final    Organism ID, Bacteria ESCHERICHIA COLI   Final   CULTURE, BLOOD (ROUTINE X 2)     Status: Normal   Collection Time   04/03/12 10:20 PM      Component Value Range Status Comment     Specimen Description BLOOD RIGHT ARM   Final    Special Requests BOTTLES DRAWN AEROBIC AND ANAEROBIC 5CC EACH   Final    Culture  Setup Time 04/04/2012 04:51   Final    Culture     Final    Value: ESCHERICHIA COLI     16 Note: Gram Stain Report Called to,Read Back By and Verified With: ASHLEY HARRISON AT 8:10PM 10 13 BY [place]   Report Status 04/06/2012 FINAL   Final    Organism ID, Bacteria ESCHERICHIA COLI   Final   CULTURE, BLOOD (ROUTINE X 2)     Status: Normal   Collection Time   04/03/12 10:30 PM      Component Value Range Status Comment   Specimen Description BLOOD RIGHT HAND   Final    Special Requests BOTTLES DRAWN AEROBIC ONLY 1CC   Final    Culture  Setup Time 04/04/2012 04:51   Final    Culture     Final    Value: ESCHERICHIA COLI     Note: SUSCEPTIBILITIES PERFORMED ON PREVIOUS CULTURE WITHIN THE LAST 5 DAYS.     16 Note: Gram Stain Report Called to,Read Back By and Verified With: ASHLEY HARRISON AT 8:10PM 10 13 BY [id number]   Report Status 04/06/2012 FINAL   Final   MRSA PCR SCREENING     Status: Normal   Collection Time   04/04/12  2:05 AM      Component Value Range Status Comment   MRSA by PCR NEGATIVE  NEGATIVE Final   CULTURE, BLOOD (ROUTINE X 2)     Status: Normal (Preliminary result)   Collection Time   04/06/12 11:20 AM      Component Value Range Status Comment   Specimen Description BLOOD ARM RIGHT   Final    Special Requests BOTTLES DRAWN AEROBIC AND ANAEROBIC 10CC   Final    Culture  Setup Time 04/06/2012 21:41   Final    Culture     Final    Value:        BLOOD CULTURE RECEIVED NO GROWTH TO DATE CULTURE WILL BE HELD FOR 5 DAYS BEFORE ISSUING A FINAL NEGATIVE REPORT   Report Status PENDING   Incomplete   CULTURE, BLOOD (ROUTINE X 2)     Status: Normal (Preliminary result)   Collection Time   04/06/12 11:35 AM      Component Value Range Status Comment   Specimen Description BLOOD ARM RIGHT   Final    Special Requests BOTTLES  DRAWN AEROBIC AND  ANAEROBIC 10CC   Final    Culture  Setup Time 04/06/2012 21:40   Final    Culture     Final    Value:        BLOOD CULTURE RECEIVED NO GROWTH TO DATE CULTURE WILL BE HELD FOR 5 DAYS BEFORE ISSUING A FINAL NEGATIVE REPORT   Report Status PENDING   Incomplete   GRAM STAIN     Status: Normal   Collection Time   04/07/12  9:22 AM      Component Value Range Status Comment   Specimen Description URINE, RANDOM   Final    Special Requests NONE   Final    Gram Stain     Final    Value: URINE CYTOSPIN     WBC PRESENT,BOTH PMN AND MONONUCLEAR     NEGATIVE FOR BACTERIA     Gram Stain Report Called to,Read Back By and Verified With: SANDOVAL J,RN 1025 04/07/12 SCALES H   Report Status 04/07/2012 FINAL   Final   CLOSTRIDIUM DIFFICILE BY PCR     Status: Normal   Collection Time   04/09/12 11:39 AM      Component Value Range Status Comment   C difficile by pcr NEGATIVE  NEGATIVE Final     Coagulation Studies:  Basename 04/10/12 0440  LABPROT 14.8  INR 1.18    Imaging: Ct Head Wo Contrast  04/11/2012  *RADIOLOGY REPORT*  Clinical Data: Follow-up stroke.  Ventilator support.  CT HEAD WITHOUT CONTRAST  Technique:  Contiguous axial images were obtained from the base of the skull through the vertex without contrast.  Comparison: 04/08/2012  Findings: The brain does not show any evidence of old or acute focal infarction, mass lesion, hemorrhage, hydrocephalus or extra- axial collection.  The calvarium is unremarkable.  No significant sinus disease.  IMPRESSION: Negative head CT.   Original Report Authenticated By: Jules Schick, M.D.    Dg Chest Port 1 View  04/11/2012  *RADIOLOGY REPORT*  Clinical Data: Endotracheal tube  PORTABLE CHEST - 1 VIEW  Comparison: 04/10/2012  Findings: Endotracheal tube is 1.5 cm above the carina.  NG is in the stomach.  Right jugular catheter in the SVC.  Bibasilar  airspace disease is unchanged.  Left effusion is unchanged.  IMPRESSION: No significant change bibasilar  atelectasis and left effusion.   Original Report Authenticated By: Truett Perna, M.D.    Dg Chest Port 1 View  04/10/2012  *RADIOLOGY REPORT*  Clinical Data: Evaluate endotracheal tube position.  Monitor pleural effusion and atelectasis versus consolidation.  PORTABLE CHEST - 1 VIEW  Comparison: 04/09/2012  Findings: Endotracheal tube terminates 2.8 cm above carina. Nasogastric tube extends beyond the  inferior aspect of the film. Right IJ central line tip at right brachycephalic vein.  Cardiomegaly accentuated by AP portable technique.  Similar small left pleural effusion. No pneumothorax.  Low lung volumes with superimposed mild interstitial edema.  Minimally improved. Improved right base aeration with persistent left greater than right bibasilar airspace disease.  IMPRESSION:  1.  Slightly improved aeration, with decreased congestive heart failure and right base air space disease. 2.  Small left pleural effusion and left base atelectasis or infection remaining. 3.  Endotracheal tube appropriately position.   Original Report Authenticated By: Areta Haber, M.D.     Medications:  I have reviewed the patient's current medications. Scheduled:   . amiodarone  400 mg Oral BID  . antiseptic oral rinse  15 mL Mouth  Rinse QID  . chlorhexidine  15 mL Mouth Rinse BID  . ciprofloxacin  400 mg Intravenous Q12H  . clonazePAM  0.5 mg Oral BID  . free water  200 mL Per Tube Q6H  . heparin subcutaneous  5,000 Units Subcutaneous Q8H  . [place]IND[place] study drug  (PI-Feinstein)  2 mL Intravenous Q12H  . multivitamin  5 mL Per Tube Daily  . DISCONTD: cefTRIAXone (ROCEPHIN)  IV  1 g Intravenous Q24H  . DISCONTD: clonazePAM  1 mg Oral Q8H  . DISCONTD: feeding supplement (OXEPA)  1,000 mL Per Tube Q24H  . DISCONTD: feeding supplement  60 mL Per Tube TID  . DISCONTD: ranitidine  150 mg Per Tube QHS    Assessment/Plan:  Patient Active Hospital Problem List: Acute encephalopathy (04/04/2012)   Assessment:  Improving.  Likely related to multiple metabolic issues and infection.     Plan: Will continue to follow with you. No further recommendations at this time.  Case discussed with daughter (20 minutes).    LOS: 8 days   Alexis Goodell, MD [facility] [phone] 04/11/2012  10:10 AM

## 2012-04-11 NOTE — Progress Notes (Signed)
RNs times 3 attempt many times to insert both 16G and 10G NGT per order All attempts unsucessful, most resulting in tube coiled in mouth and pt coughing with small amount blood coming from mouth and nares.. Dr Halford Chessman notified that we are unable to insert this tube. Obtained order for fluoro to assist in tube insertion.

## 2012-04-11 NOTE — Progress Notes (Signed)
Name: [name] MRN: [id number] DOB: 03-30-1946    LOS: 8  Referring Provider:  Zenia Resides EDP Reason for Referral:  Septic shock  PULMONARY / CRITICAL CARE MEDICINE  Brief patient description:  This is a 66 y/o female admitted on 10/16 to [facility] with septic shock likely from pyelonephritis from left obstructing stone in the UPG    Lines: 10/15 R IJ (EDP) >> ETT:  10/15>>>10/23   Cultures: 10/15 Urine >>e-coli pansensitive 10/15 Blood >>e-coli, pansensitive.  10/15 Urine st/leg ag>>neg 10/18: repeat blood>>no growth to date UA: 10/18: large leuks, large nitrites.  cdiff 10/21: neg  Antibiotics: 10/15 zosyn x1  10/15 vanc (severe cap) >>DC 10/15 ceftriaxone (pyelo, severe CAP) >>10/16 10/15 azithro (severe cap) >>10/16 10/16: levofloxacin >>>10/18 10/16: ceftaz>>>10/18 10/18 ceftriaxone (urine and blood cult pos ecoli)>>>10/22 rash  10/22 cipro>>>  Events Since Admission: 10/15 CT Ab/pelv (stone protocol) >> 10/16: Cystoscopy, Left Retrograde Pyelogram, with interpretation, and Left JJ stent ( 62F x 24 cm). 10/17- refractory shock, acidosis, fib, new dx septic cardiomyopathy likely 10/19 renal u/s>>no hydroneph 10/20 CT head>>NEg   Current Status: Off of propofol this am. Not following commands  Vital Signs: Temp:  [98 F (36.7 C)-99.4 F (37.4 C)] 98.6 F (37 C) (10/23 0420) Pulse Rate:  [75-92] 75  (10/23 0600) Resp:  [20-45] 24  (10/23 0600) BP: (100-123)/(41-67) 117/45 mmHg (10/23 0600) SpO2:  [92 %-100 %] 100 % (10/23 0600) FiO2 (%):  [40 %] 40 % (10/23 0600) Weight:  [244 lb 0.8 oz (110.7 kg)] 244 lb 0.8 oz (110.7 kg) (10/23 0500)    . sodium chloride 500 mL (04/10/12 1154)  . heparin 2,100 Units/hr (04/11/12 0028)  . propofol 40 mcg/kg/min (04/11/12 0159)    Intake/Output Summary (Last 24 hours) at 04/11/12 0649 Last data filed at 04/11/12 0600  Gross per 24 hour  Intake 3300.28 ml  Output   2250 ml  Net 1050.28 ml   Physical Examination: Gen:  extubated, agitated HEENT: NCAT, MM dry, PERRL, EOMi PULM: diffuse rhonchi  CV: regular rate and rhythm, distant heart sounds AB: BS+, soft, nontender, no masses, no guarding or rebound Ext: warm and well perfused Neuro: doesn't follow command. Encephalopathic Skin - intertrigo under right breast, dressing in place    Lab 04/11/12 0420 04/10/12 0440 04/09/12 0500  NA 145 148* 143  K 3.6 3.3* 3.1*  CL 114* 115* 111  CO2 19 22 23   BUN 63* 68* 68*  CREATININE 1.93* 2.04* 2.28*  GLUCOSE 104* 108* 119*    Lab 04/11/12 0420 04/10/12 0440 04/09/12 0500  HGB 10.3* 10.3* 10.9*  HCT 29.6* 30.0* 31.4*  WBC 24.5* 24.7* 24.8*  PLT 149* 109* 84*     CXR:  Cardiomegally, bibasilar atelectasis, left pleural effusion  ASSESSMENT AND PLAN 1) Acute respiratory failure in setting of sepsis related ALI P:   - extubated this morning 10/23. Follow up respiratory status  2) NSTEMI vs demand ischemia,  w/ Echo: EF: 20-25% and clinical scenario Septic cardiomyopathy more likely etiology. Repeat echo 10/21: 60-65% EF New onset a-fib 10/17: loaded with amio, now on amio gtt and heparin   Lab 04/05/12 0035 04/04/12 1807 04/04/12 1230  CKTOTAL -- -- --  CKMB -- -- --  TROPONINI 1.49* 1.49* 1.12*  P:  -amiodarone 400mg  bid per tube. Transition to 200 daily at discharge for 1 month -per cardiology recs: long term anticoagulation not recommended given low CHADS score. D/c heparin  3) Septic shock (resolved): in setting of E-coli  bacteremia from pyelonephritis with obstructive uropathy;  Now improved, shock resolved.  P:   -continue cipro: day 2  4) Acute renal failure and ATN from shock, increased urine output 10/19: possibly from post ATN diuresis 5) History of nephrolithiasis now with L pyelonephritis: CT: impacted 4mm left upper ureteral stone at [facility].  - left JJ stent placed.  - no evidence of hydronephrosis on repeat US 10/16 Recent Labs  Basename 04/11/12 0420 04/10/12 0440 04/09/12 0500    CREATININE 1.93* 2.04* 2.28*  - improving creatinine and making urine Plan: - BMP in AM  6) Hypokalemia   Lab 04/11/12 0420 04/10/12 0440 04/09/12 0500  K 3.6 3.3* 3.1*  P: Check BMP in am   7) hypernatremia  Lab 04/11/12 0420 04/10/12 0440 04/09/12 0500  NA 145 148* 143  Plan: -replace free water by NG and follow up bmp in am.   8) Elevated LFT's, likely from shock liver: improving  Lab 04/11/12 0420 04/10/12 0440 04/08/12 0452  ALT 57* 70* 108*  AST 37 53* 77*  GGT -- -- --  ALKPHOS 195* 270* 255*  BILITOT 0.2* 0.3 0.2*  P:  - LFT's improving  9) Diarrhea  -d/c H2 blocker since no longer intubated - c-diff negative  10) Thrombocytopenia, likely from E-coli urosepsis, w/ hematuria.  Improving.   Lab 04/11/12 0420 04/10/12 0440 04/09/12 0500  PLT 149* 109* 84*  P:  -monitor platelets on heparin  11) Acute encephalopathy from sepsis, not  improving ?CVA, very agitated this AM Head CT: no acute process.  EEG: 10/23: moderately severe continuous generalized slowing of cerebral activity   P:   -on propofol and on MIND [place] trial: haldol vs zirprazidone vs placebo.  - neurology recommends supportive care - continue clonazepam scheduled   12) protein cal malnutrition  P: Cont tube feeds via NG since patient not eating s/p extubation  13) raised erythremic rash: possible drug reaction?  P: -wound care recommends treatment for intertrigo under right breast  -beta lactam d/ced for rash on back   BEST PRACTICE / DISPOSITION Level of Care:  ICU Primary Service:  PCCM Consultants:  Urology: signed out 10/17 Code Status:  full Diet:  TF DVT Px: hep IV fib 10/18>>> GI Px:  Zantac  Skin Integrity:  normal Social / Family:  Updated daughter at bedside daily   Did well with SBT and extubated this AM.  Mental status remains issue, but more likely metabolic.  Continue Abx.  D/c heparin gtt.  Monitor mental status.  Place NG tube with fluoro, and keep in place  until mental status improves.    Chesley Mires, MD [facility] Pulmonary/Critical Care 04/11/2012, 2:26 PM Pager:  [phone] After 3pm call: [phone]

## 2012-04-11 NOTE — Procedures (Signed)
EEG NUMBER:  [id number].  REFERRING PHYSICIAN:  Chesley Mires, MD.  INDICATION FOR STUDY:  A 66 year old lady, unresponsive on a ventilator with septic shock and acute encephalopathy.  This is a standard EEG recording performed at patient's bedside in the intensive care unit.  Predominant background activity consisted of a 1-2 Hz low-to-moderate amplitude delta activity with superimposed irregular 6-7 Hz theta activity diffusely.  There were frequent occurrences of biphasic and triphasic moderately high amplitude sharp waves recorded diffusely but most prominent in the frontal regions.  Sharp wave activity appeared to be random for the most part, but occasionally was briefly periodic occurring about 1 per second.  Photic stimulation produced a symmetrical occipital driving response.  No frank epileptic discharges were recorded.  INTERPRETATION:  This EEG showed moderately severe continuous generalized slowing of cerebral activity consistent with patient's history of encephalopathy.  No evidence of seizure activity was demonstrated.     Wallie Char, MD    [id number] D:  04/10/2012 12:13:31  T:  04/11/2012 [id number]  Job #:  [id number]

## 2012-04-12 ENCOUNTER — Inpatient Hospital Stay (HOSPITAL_COMMUNITY): Payer: Medicare Other

## 2012-04-12 LAB — GLUCOSE, CAPILLARY
Glucose-Capillary: 106 mg/dL — ABNORMAL HIGH (ref 70–99)
Glucose-Capillary: 110 mg/dL — ABNORMAL HIGH (ref 70–99)
Glucose-Capillary: 113 mg/dL — ABNORMAL HIGH (ref 70–99)
Glucose-Capillary: 77 mg/dL (ref 70–99)
Glucose-Capillary: 97 mg/dL (ref 70–99)

## 2012-04-12 LAB — BASIC METABOLIC PANEL
BUN: 51 mg/dL — ABNORMAL HIGH (ref 6–23)
CO2: 22 mEq/L (ref 19–32)
Calcium: 8.3 mg/dL — ABNORMAL LOW (ref 8.4–10.5)
Chloride: 117 mEq/L — ABNORMAL HIGH (ref 96–112)
Creatinine, Ser: 1.66 mg/dL — ABNORMAL HIGH (ref 0.50–1.10)
GFR calc Af Amer: 36 mL/min — ABNORMAL LOW (ref >90)
GFR calc non Af Amer: 31 mL/min — ABNORMAL LOW (ref >90)
Glucose, Bld: 113 mg/dL — ABNORMAL HIGH (ref 70–99)
Potassium: 3.7 mEq/L (ref 3.5–5.1)
Sodium: 148 mEq/L — ABNORMAL HIGH (ref 135–145)

## 2012-04-12 LAB — CBC
HCT: 28.8 % — ABNORMAL LOW (ref 36.0–46.0)
Hemoglobin: 9.5 g/dL — ABNORMAL LOW (ref 12.0–15.0)
MCH: 28.5 pg (ref 26.0–34.0)
MCHC: 33 g/dL (ref 30.0–36.0)
MCV: 86.5 fL (ref 78.0–100.0)
Platelets: 135 10*3/uL — ABNORMAL LOW (ref 150–400)
RBC: 3.33 MIL/uL — ABNORMAL LOW (ref 3.87–5.11)
RDW: 16.8 % — ABNORMAL HIGH (ref 11.5–15.5)
WBC: 17.9 10*3/uL — ABNORMAL HIGH (ref 4.0–10.5)

## 2012-04-12 LAB — CULTURE, BLOOD (ROUTINE X 2)
Culture: NO GROWTH
Culture: NO GROWTH

## 2012-04-12 MED ORDER — AMIODARONE HCL IN DEXTROSE 360-4.14 MG/200ML-% IV SOLN
30.0000 mg/h | INTRAVENOUS | Status: DC
  Administered 2012-04-12 – 2012-04-13 (×2): 30 mg/h via INTRAVENOUS
  Filled 2012-04-12 (×8): qty 200

## 2012-04-12 MED ORDER — ASPIRIN EC 81 MG PO TBEC
81.0000 mg | DELAYED_RELEASE_TABLET | Freq: Every day | ORAL | Status: DC
  Administered 2012-04-13 – 2012-04-17 (×5): 81 mg via ORAL
  Filled 2012-04-12 (×6): qty 1

## 2012-04-12 NOTE — Progress Notes (Signed)
Name: [name] MRN: [id number] DOB: [birth year]    LOS: 9  Referring Provider:  Zenia Resides EDP Reason for Referral:  Septic shock  PULMONARY / CRITICAL CARE MEDICINE  Brief patient description:  This is a 66 y/o female admitted on 10/16 to [facility] with septic shock likely from pyelonephritis from left obstructing stone in the UPG    Lines: 10/15 R IJ (EDP) >> ETT:  10/15>>>10/23   Cultures: 10/15 Urine >>e-coli pansensitive 10/15 Blood >>e-coli, pansensitive.  10/15 Urine st/leg ag>>neg 10/18: repeat blood>>no growth to date UA: 10/18: large leuks, large nitrites.  cdiff 10/21: neg  Antibiotics: 10/15 zosyn x1  10/15 vanc (severe cap) >>DC 10/15 ceftriaxone (pyelo, severe CAP) >>10/16 10/15 azithro (severe cap) >>10/16 10/16: levofloxacin >>>10/18 10/16: ceftaz>>>10/18 10/18 ceftriaxone (urine and blood cult pos ecoli)>>>10/22 rash  10/22 cipro>>>  Events Since Admission: 10/15 CT Ab/pelv (stone protocol) >> 10/16: Cystoscopy, Left Retrograde Pyelogram, with interpretation, and Left JJ stent ( 7F x 24 cm). 10/17- refractory shock, acidosis, fib, new dx septic cardiomyopathy likely 10/19 renal u/s>>no hydroneph 10/20 CT head>>NEg   Current Status: Extubated 10/23 doing well on nasal canula 4L.   Vital Signs: Temp:  [97.5 F (36.4 C)-98.5 F (36.9 C)] 98.5 F (36.9 C) (10/24 0800) Pulse Rate:  [84-101] 90  (10/24 0800) Resp:  [13-30] 22  (10/24 0800) BP: (102-142)/(47-104) 142/54 mmHg (10/24 0800) SpO2:  [95 %-100 %] 100 % (10/24 0800)    . sodium chloride 500 mL (04/10/12 1154)    Intake/Output Summary (Last 24 hours) at 04/12/12 1053 Last data filed at 04/12/12 1000  Gross per 24 hour  Intake   1240 ml  Output   1850 ml  Net   -610 ml   Physical Examination: Gen: extubated, alert and oriented to person and place.  HEENT: NCAT, MM dry, PERRL, EOMi PULM: occasional wheezing CV: regular rate and rhythm, distant heart sounds AB: BS+, soft, nontender, no  masses, no guarding or rebound Ext: warm and well perfused Neuro: extubated, alert and oriented to person and place.  Skin - intertrigo under right breast, dressing in place    Lab 04/12/12 0350 04/11/12 0420 04/10/12 0440  NA 148* 145 148*  K 3.7 3.6 3.3*  CL 117* 114* 115*  CO2 22 19 22   BUN 51* 63* 68*  CREATININE 1.66* 1.93* 2.04*  GLUCOSE 113* 104* 108*    Lab 04/12/12 0350 04/11/12 0420 04/10/12 0440  HGB 9.5* 10.3* 10.3*  HCT 28.8* 29.6* 30.0*  WBC 17.9* 24.5* 24.7*  PLT 135* 149* 109*     CXR:  Cardiomegally, bibasilar atelectasis, left pleural effusion  ASSESSMENT AND PLAN 1) Acute respiratory failure in setting of sepsis related ALI. Tolerating extubation P:   - continue albuterol  2) NSTEMI vs demand ischemia,  w/ Echo: EF: 20-25% and clinical scenario Septic cardiomyopathy more likely etiology. Repeat echo 10/21: 60-65% EF New onset a-fib 10/17: loaded with amio, now on amiodarone po. Heparin stopped.  P:  -amiodarone 400mg  bid per tube. Transition to 200 daily at discharge for 1 month -per cardiology recs: long term anticoagulation not recommended given low CHADS score. D/c heparin. Start aspirin  3) Septic shock (resolved): in setting of E-coli bacteremia from pyelonephritis with obstructive uropathy;  Now improved, shock resolved.  P:   -continue cipro: day 3  4) Acute renal failure and ATN from shock, creatinine improving with good urine output 5) History of nephrolithiasis now with L pyelonephritis: CT: impacted 59mm left upper ureteral stone  at [facility].  - left JJ stent placed.  - no evidence of hydronephrosis on repeat US 10/16 Recent Labs  Basename 04/12/12 0350 04/11/12 0420 04/10/12 0440   CREATININE 1.66* 1.93* 2.04*  - improving creatinine and making urine Plan: - BMP in AM  6) Hypokalemia   Lab 04/12/12 0350 04/11/12 0420 04/10/12 0440  K 3.7 3.6 3.3*  P: Check BMP in am   7) hypernatremia  Lab 04/12/12 0350 04/11/12 0420 04/10/12  0440  NA 148* 145 148*  Plan: -may resolve once eating and drinking. - BMP in am  8) Elevated LFT's, likely from shock liver: improving  Lab 04/11/12 0420 04/10/12 0440 04/08/12 0452  ALT 57* 70* 108*  AST 37 53* 77*  GGT -- -- --  ALKPHOS 195* 270* 255*  BILITOT 0.2* 0.3 0.2*  P:  - LFT's improving  9) Diarrhea  -d/c H2 blocker since no longer intubated - c-diff negative  10) Thrombocytopenia, likely from E-coli urosepsis, w/ hematuria.  Improving and stable  Lab 04/12/12 0350 04/11/12 0420 04/10/12 0440  PLT 135* 149* 109*  P:  -monitor platelets on heparin  11) Acute encephalopathy from sepsis, much improved. Now alert and oriented x2 Head CT: no acute process.  EEG: 10/23: moderately severe continuous generalized slowing of cerebral activity   P:   -will d/c klonopin - redirection with family at bedside   12) protein cal malnutrition  P: NG not placed yesterday due to technical difficulty. - speech eval for swallowing and diet.   13) raised erythremic rash: possible drug reaction?  P: -wound care recommends treatment for intertrigo under right breast  -beta lactam d/ced for rash on back  14) back pain: likely from immobilization in bed - fentanyl prn - once taking po, can start oral agent   BEST PRACTICE / DISPOSITION Level of Care:  ICU Primary Service:  PCCM Consultants:  Urology: signed out 10/17 Code Status:  full Diet:  NPO DVT Px: hep IV fib 10/18>>>10/23 GI Px:  Zantac  Skin Integrity:  normal Social / Family:  Updated daughter at bedside daily Dispo: transfer to [facility], PGY-2 Family Medicine Resident ICU Rotation  Mental status improved.  No obvious injuries from fall this morning.  Good urine outpt, and renal fx improving.  No convinced she needs diuresis as respiratory status improving.  Will transfer to SDU.  Will ask [facility] to assume care from 10/25 and PCCM sign off.  Chesley Mires, MD [facility]  Pulmonary/Critical Care 04/12/2012, 12:42 PM Pager:  [phone] After 3pm call: [phone]

## 2012-04-12 NOTE — Progress Notes (Signed)
04/12/12 1333  Clinical Encounter Type  Visited With Family  Visit Type Follow-up  Stress Factors  Patient Stress Factors None identified  Family Stress Factors Health changes   I spoke to patient's daughter. She said her mom is doing better. I will follow up again. Chaplain Reyes Ivan

## 2012-04-12 NOTE — Evaluation (Signed)
Clinical/Bedside Swallow Evaluation Patient Details  Name: [name] MRN: [id number] Date of Birth: 11-07-1945  Today's Date: 04/12/2012 Time: 1600-1620 SLP Time Calculation (min): 20 min  Past Medical History:  Past Medical History  Diagnosis Date  . Hypertension   . Pneumonia   . GERD (gastroesophageal reflux disease)   . Headache    Past Surgical History:  Past Surgical History  Procedure Date  . Abdominal hysterectomy   . Cholecystectomy   . Cystoscopy w/ ureteral stent placement 04/04/2012    Procedure: CYSTOSCOPY WITH RETROGRADE PYELOGRAM/URETERAL STENT PLACEMENT;  Surgeon: Ailene Rud, MD;  Location: [place];  Service: Urology;  Laterality: Left;   HPI:   This is a 66 y/o female admitted on 10/16 to [facility] with septic shock likely from pyelonephritis from left obstructing stone in the UPG   Assessment / Plan / Recommendation Clinical Impression  Pt presents with high risk of decreased airway protection and silent aspiration (prolonged intubation, aphonia, weak cough response). Pt did have one episode of very delayed or unrelated cough response after small sips of water. Feel pt may consume pills whole in puree if necessary but will need objective assessment of swallow function prior to initiating a PO diet. Recommend FEES tomorrow in am. NPO except meds.     Aspiration Risk  Moderate    Diet Recommendation NPO except meds   Medication Administration: Whole meds with puree    Other  Recommendations Recommended Consults: FEES Oral Care Recommendations: Oral care QID   Follow Up Recommendations  Other (comment) (defer until completion of FEES)    Frequency and Duration        Pertinent Vitals/Pain NA    SLP Swallow Goals     Swallow Study Prior Functional Status       General HPI:  This is a 66 y/o female admitted on 10/16 to [facility] with septic shock likely from pyelonephritis from left obstructing stone in the UPG Type of Study: Bedside swallow  evaluation Diet Prior to this Study: NPO Temperature Spikes Noted: No Respiratory Status: Supplemental O2 delivered via (comment) History of Recent Intubation: Yes Length of Intubations (days): 9 days Date extubated: 04/11/12 Behavior/Cognition: Alert;Cooperative Oral Cavity - Dentition: Adequate natural dentition Self-Feeding Abilities: Total assist Patient Positioning: Postural control interferes with function Baseline Vocal Quality: Aphonic Volitional Cough: Weak Volitional Swallow: Able to elicit    Oral/Motor/Sensory Function Overall Oral Motor/Sensory Function: Impaired Labial ROM: Within Functional Limits Labial Symmetry: Within Functional Limits Labial Strength: Reduced Lingual ROM: Reduced left Lingual Symmetry: Within Functional Limits Lingual Strength: Reduced Facial ROM: Within Functional Limits Facial Symmetry: Within Functional Limits   Ice Chips Ice chips: Within functional limits   Thin Liquid Thin Liquid: Impaired Presentation: Cup Pharyngeal  Phase Impairments: Cough - Delayed    Nectar Thick Nectar Thick Liquid: Not tested   Honey Thick Honey Thick Liquid: Not tested   Puree Puree: Within functional limits   Solid   GO    Solid: Not tested       Naiomi Musto, Katherene Ponto 04/12/2012,4:30 PM

## 2012-04-12 NOTE — Progress Notes (Signed)
Event Progress Note: Around 9:00PM, patient was found calling for help, out of bed on the floor sitting in the upright position. She had apparently tried to get out of bed on her own and had slid through the rail and cushion. She was put back in bed and denied any pain. She was moving all extremities spontaneously once in bed. She remained alert and oriented to person and place, unchanged from previous. Vital signs remained stable throughout.  Daughter arrived at bedside and was updated.    Liam Graham, PGY-2 Family Medicine Resident ICU Rotation

## 2012-04-13 DIAGNOSIS — G929 Unspecified toxic encephalopathy: Secondary | ICD-10-CM

## 2012-04-13 DIAGNOSIS — G92 Toxic encephalopathy: Secondary | ICD-10-CM

## 2012-04-13 LAB — CBC
HCT: 33.1 % — ABNORMAL LOW (ref 36.0–46.0)
Hemoglobin: 10.6 g/dL — ABNORMAL LOW (ref 12.0–15.0)
MCH: 28.2 pg (ref 26.0–34.0)
MCHC: 32 g/dL (ref 30.0–36.0)
MCV: 88 fL (ref 78.0–100.0)
Platelets: 146 10*3/uL — ABNORMAL LOW (ref 150–400)
RBC: 3.76 MIL/uL — ABNORMAL LOW (ref 3.87–5.11)
RDW: 16.9 % — ABNORMAL HIGH (ref 11.5–15.5)
WBC: 14.3 10*3/uL — ABNORMAL HIGH (ref 4.0–10.5)

## 2012-04-13 LAB — BASIC METABOLIC PANEL
BUN: 42 mg/dL — ABNORMAL HIGH (ref 6–23)
CO2: 20 mEq/L (ref 19–32)
Calcium: 8.7 mg/dL (ref 8.4–10.5)
Chloride: 117 mEq/L — ABNORMAL HIGH (ref 96–112)
Creatinine, Ser: 1.43 mg/dL — ABNORMAL HIGH (ref 0.50–1.10)
GFR calc Af Amer: 43 mL/min — ABNORMAL LOW (ref >90)
GFR calc non Af Amer: 37 mL/min — ABNORMAL LOW (ref >90)
Glucose, Bld: 98 mg/dL (ref 70–99)
Potassium: 3.8 mEq/L (ref 3.5–5.1)
Sodium: 148 mEq/L — ABNORMAL HIGH (ref 135–145)

## 2012-04-13 NOTE — Progress Notes (Signed)
Pt transferred from 2100 via chair at 1347hrs.  VSS on arrival.  Oriented to unit and plan of care for shift.  In atrial fibrillation on the monitor.  C/o back pain rating 10/10, no radiating.

## 2012-04-13 NOTE — Consult Note (Signed)
Physical Medicine and Rehabilitation Consult Reason for Consult: Encephalopathy Referring Physician:  Dr Wynelle Cleveland   HPI: [name] is a 66 y.o. female with history of HTN, nephrolithiasis; who was admitted on 10/15 with progressive pain with fever, chills, dysuria and confusion. She was noted to be hypoxic and in uroseptic shock due to & mm obstructing stone in left ureter. She developed respiratory failure requiring intubation. She was taken to OR on 10/16  for L-JJ stent placement by Dr. Gaynelle Arabian. BC/UC positive for E.Coli.  Was treated with sepsis protocol and had prolonged intubation. Cardiac echo done revealing diffuse hypokinesis with EF 20-25% likely due to septic CM. She developed A-fib and converted to NSR on amiodarone. NSTEMI likely due to demand ischemia. She has been confused with agitation and was treated with propofol and MIND trial for delirium. Neurology consulted for input and felt that patient with acute metabolic encephalopathy. Lactic acid and ammonia levels normal. EEG with generalized background slowing.  Repeat cardiac echo done showing improvement in EF to 60-65%. Dr. Percival Spanish consulted for input and recommended continuing amiodarone 400 mg bid with transition to  200 mg daily at discharge. No indications for coumadin at this time.  Issues with acute renal failure, fluid overload, leucocytosis and thrombocytopenia resolving. She was extubated on 10/23 and NPO due to concerns of dysphagia.  FEES done today and patient started on regular diet. PT/OT evaluations done today and patient noted to be deconditioned and continues to have problems processing. Therapy team recommending CIR.   According to her sister-in-law who is visiting in the room, the patient just received some type of pain medication through the IV Prior to that the patient did recognize all her her visitors and was carrying on a conversation Review of Systems  Unable to perform ROS: other  Gastrointestinal:  Positive for abdominal pain.       Abdominal discomfort.   Genitourinary: Positive for urgency.  Psychiatric/Behavioral: Positive for depression. The patient is nervous/anxious.    Past Medical History  Diagnosis Date  . Hypertension   . Pneumonia   . GERD (gastroesophageal reflux disease)   . Headache    Past Surgical History  Procedure Date  . Abdominal hysterectomy   . Cholecystectomy   . Cystoscopy w/ ureteral stent placement 04/04/2012    Procedure: CYSTOSCOPY WITH RETROGRADE PYELOGRAM/URETERAL STENT PLACEMENT;  Surgeon: Ailene Rud, MD;  Location: [place];  Service: Urology;  Laterality: Left;   History reviewed. No pertinent family history.  Social History:  reports that she has never smoked. She does not have any smokeless tobacco history on file. She reports that she does not drink alcohol or use illicit drugs.  Allergies: No Known Allergies  Medications Prior to Admission  Medication Sig Dispense Refill  . bimatoprost (LUMIGAN) 0.01 % SOLN Place 1 drop into both eyes at bedtime.      . hydrochlorothiazide (MICROZIDE) 12.5 MG capsule Take 12.5 mg by mouth daily.      Marland Kitchen lisinopril (PRINIVIL,ZESTRIL) 5 MG tablet Take 5 mg by mouth daily.        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 Status:  Mobility: Bed Mobility Bed Mobility: Supine  to Sit;Sitting - Scoot to Edge of Bed Supine to Sit: 1: +2 Total assist Supine to Sit: Patient Percentage: 50% Sitting - Scoot to Edge of Bed: 1: +2 Total assist Sitting - Scoot to Edge of Bed: Patient Percentage: 50% Transfers Transfers: Sit to Stand;Stand to [employer] Transfers Sit to Stand: 1: +2 Total assist;From bed;With upper extremity assist Sit to Stand: Patient Percentage: 60% Stand to Sit: 1: +2 Total assist;To chair/3-in-1;With upper extremity assist Stand to Sit: Patient Percentage: 50% Stand Pivot Transfers: 1: +2 Total assist Stand Pivot Transfers: Patient Percentage: 50% Ambulation/Gait Ambulation/Gait Assistance: Not tested (comment) Stairs: No Wheelchair Mobility Wheelchair Mobility: No  ADL: ADL Eating/Feeding: +1 Total assistance (Pt unable to manipulate utensils) Where Assessed - Eating/Feeding: Chair Grooming: Moderate assistance;Wash/dry face Where Assessed - Grooming: Supported sitting Upper Body Bathing: +1 Total assistance Where Assessed - Upper Body Bathing: Supported sitting Lower Body Bathing: +1 Total assistance Where Assessed - Lower Body Bathing: Supported sit to stand Upper Body Dressing: +1 Total assistance Where Assessed - Upper Body Dressing: Supported sitting Lower Body Dressing: +1 Total assistance Where Assessed - Lower Body Dressing: Supported sit to Lobbyist: +2 Total assistance Toilet Transfer Method: Stand pivot;Sit to Loss adjuster, chartered: Other (comment) Building services engineer) Tub/Shower Transfer:  (Not appropriate) Equipment Used: Gait belt Transfers/Ambulation Related to ADLs: Pt. transferred bed to recliner with total A +2 (pt ~50%)  Cognition: Cognition Arousal/Alertness: Awake/alert Orientation Level: Oriented to person;Oriented to place Cognition Overall Cognitive Status: Impaired Area of Impairment: Attention;Following commands;Safety/judgement;Awareness of errors;Awareness of deficits;Problem  solving Arousal/Alertness: Awake/alert Orientation Level: Appears intact for tasks assessed (slow to respond, but accurate) Behavior During Session: Restless Current Attention Level: Sustained Attention - Other Comments: 10 seconds Following Commands: Follows one step commands with increased time Safety/Judgement: Decreased safety judgement for tasks assessed;Impulsive Awareness of Errors: Assistance required to identify errors made;Assistance required to correct errors made Problem Solving: mod A Cognition - Other Comments: pt focused on lunch and eating.    Blood pressure 120/59, pulse 101, temperature 97.1 F (36.2 C), temperature source Oral, resp. rate 19, height 5\' 4"  (1.626 m), weight 110.7 kg (244 lb 0.8 oz), SpO2 99.00%. Physical Exam  Nursing note and vitals reviewed. Constitutional: She appears well-developed and well-nourished.       Anxious and confused.   HENT:  Head: Normocephalic and atraumatic.  Eyes: Pupils are equal, round, and reactive to light.  Neck: Normal range of motion.  Cardiovascular: Normal rate and regular rhythm.   Pulmonary/Chest: Effort normal and breath sounds normal.  Abdominal: Soft.  Musculoskeletal: She exhibits no edema.  Neurological:       Disoriented and asking for family member. Focused on multiple somatic issues---back pain, GI/GU discomfort. Anxious and fearful. Moves all four.   Motor strength is graded as 4 minus/5 in bilateral deltoid, biceps, triceps, grip 3 minus/5 in the hip flexors 2 minus in the knee extensors and 3 minus in ankle dorsiflexors bilaterally Sensation difficult to assess secondary to lethargy Babinski's are downgoing  Results for orders placed during the hospital encounter of 04/03/12 (from the past 24 hour(s))  GLUCOSE, CAPILLARY     Status: Normal   Collection Time   04/12/12  4:13 PM      Component Value Range   Glucose-Capillary 77  70 -  99 mg/dL  BASIC METABOLIC PANEL     Status: Abnormal   Collection Time    04/13/12  3:27 AM      Component Value Range   Sodium 148 (*) 135 - 145 mEq/L   Potassium 3.8  3.5 - 5.1 mEq/L   Chloride 117 (*) 96 - 112 mEq/L   CO2 20  19 - 32 mEq/L   Glucose, Bld 98  70 - 99 mg/dL   BUN 42 (*) 6 - 23 mg/dL   Creatinine, Ser 1.43 (*) 0.50 - 1.10 mg/dL   Calcium 8.7  8.4 - 10.5 mg/dL   GFR calc non Af Amer 37 (*) >90 mL/min   GFR calc Af Amer 43 (*) >90 mL/min  CBC     Status: Abnormal   Collection Time   04/13/12  3:27 AM      Component Value Range   WBC 14.3 (*) 4.0 - 10.5 K/uL   RBC 3.76 (*) 3.87 - 5.11 MIL/uL   Hemoglobin 10.6 (*) 12.0 - 15.0 g/dL   HCT 33.1 (*) 36.0 - 46.0 %   MCV 88.0  78.0 - 100.0 fL   MCH 28.2  26.0 - 34.0 pg   MCHC 32.0  30.0 - 36.0 g/dL   RDW 16.9 (*) 11.5 - 15.5 %   Platelets 146 (*) 150 - 400 K/uL   Dg Chest Port 1 View  04/12/2012  *RADIOLOGY REPORT*  Clinical Data: Shortness of breath, pleural effusion, follow-up, history hypertension  PORTABLE CHEST - 1 VIEW  Comparison: Portable exam 0520 hours compared to 04/11/2012  Findings: Endotracheal tube and moved. Nasogastric tube tip projects over the distal esophagus. Right jugular line stable. Enlargement of cardiac silhouette with pulmonary vascular congestion and perihilar infiltrates likely pulmonary edema. Small left pleural effusion and basilar atelectasis. No pneumothorax.  IMPRESSION: Probable pulmonary edema. Nasogastric tube has been partially withdrawn with tip now projecting over the distal esophagus, recommend advancing tube 7 cm.   Original Report Authenticated By: Burnetta Sabin, M.D.     Assessment/Plan: Diagnosis: Metabolic encephalopathy and deconditioning do to uroseptic shock 1. Does the need for close, 24 hr/day medical supervision in concert with the patient's rehab needs make it unreasonable for this patient to be served in a less intensive setting? Potentially 2. Co-Morbidities requiring supervision/potential complications: Pyelonephritis,  nephrolithiasis 3. Due to bladder management, bowel management, safety, skin/wound care, disease management, medication administration, pain management and patient education, does the patient require 24 hr/day rehab nursing? Potentially 4. Does the patient require coordinated care of a physician, rehab nurse, PT (1-2 hrs/day, 5 days/week), OT (1-2 hrs/day, 5 days/week) and SLP (0.5-1 hrs/day, 5 days/week) to address physical and functional deficits in the context of the above medical diagnosis(es)? Potentially Addressing deficits in the following areas: balance, endurance, locomotion, strength, transferring, bowel/bladder control, bathing, dressing, feeding, grooming, toileting and cognition 5. Can the patient actively participate in an intensive therapy program of at least 3 hrs of therapy per day at least 5 days per week? Potentially 6. The potential for patient to make measurable gains while on inpatient rehab is good 7. Anticipated functional outcomes upon discharge from inpatient rehab are Min assist mobility with PT, Min assist to supervision ADLs with OT, Express basic biographical information, help direct medical care with SLP. 8. Estimated rehab length of stay to reach the above functional goals is: 10-14 days 9. Does the patient have adequate social supports to accommodate these discharge functional goals? Potentially 10. Anticipated D/C setting: Home  11. Anticipated post D/C treatments: [place] therapy 12. Overall Rehab/Functional Prognosis: good  RECOMMENDATIONS: This patient's condition is appropriate for continued rehabilitative care in the following setting: Currently not able to tolerate inpatient rehabilitation. Will have rehabilitation admission nurse followup. She also will need to be off of her Cardene drip Patient has agreed to participate in recommended program. Potentially Note that insurance prior authorization may be required for reimbursement for recommended  care.  Comment:    04/13/2012

## 2012-04-13 NOTE — Evaluation (Signed)
Physical Therapy Evaluation Patient Details Name: [name] MRN: [id number] DOB: 19-Aug-1945 Today's Date: 04/13/2012 Time: [id number] PT Time Calculation (min): 18 min  PT Assessment / Plan / Recommendation Clinical Impression  pt presents with Pyelonephritis, VDRF, Septic Shock.  pt with slow cognition and difficult to maintain attention.  pt very motivated for OOB.  pt PTA was very independent.  Would benefit from CIR at D/C.      PT Assessment  Patient needs continued PT services    Follow Up Recommendations  Post acute inpatient    Does the patient have the potential to tolerate intense rehabilitation   Yes, Recommend IP Rehab Screening  Barriers to Discharge None      Equipment Recommendations  Rolling walker with 5" wheels    Recommendations for Other Services Rehab consult   Frequency Min 3X/week    Precautions / Restrictions Precautions Precautions: Fall Restrictions Weight Bearing Restrictions: No   Pertinent Vitals/Pain Denies pain.        Mobility  Bed Mobility Bed Mobility: Supine to Sit;Sitting - Scoot to Edge of Bed Supine to Sit: 1: +2 Total assist Supine to Sit: Patient Percentage: 50% Sitting - Scoot to Edge of Bed: 1: +2 Total assist Sitting - Scoot to Edge of Bed: Patient Percentage: 50% Details for Bed Mobility Assistance: assist for LEs and to lift shoulders off bed Transfers Transfers: Sit to Stand;Stand to Sit;Stand Pivot Transfers Sit to Stand: 1: +2 Total assist;From bed;With upper extremity assist Sit to Stand: Patient Percentage: 60% Stand to Sit: 1: +2 Total assist;To chair/3-in-1;With upper extremity assist Stand to Sit: Patient Percentage: 50% Stand Pivot Transfers: 1: +2 Total assist Stand Pivot Transfers: Patient Percentage: 50% Details for Transfer Assistance: Pt. attempts to sit uncontrolled as she pivoted to recliner.  Maintains flexed posture Ambulation/Gait Ambulation/Gait Assistance: Not tested (comment) Stairs:  No Wheelchair Mobility Wheelchair Mobility: No    Shoulder Instructions     Exercises     PT Diagnosis: Difficulty walking;Generalized weakness  PT Problem List: Decreased strength;Decreased activity tolerance;Decreased balance;Decreased mobility;Decreased coordination;Decreased cognition;Decreased knowledge of use of DME;Decreased safety awareness;Cardiopulmonary status limiting activity PT Treatment Interventions: DME instruction;Gait training;Stair training;Functional mobility training;Therapeutic activities;Therapeutic exercise;Balance training;Neuromuscular re-education;Cognitive remediation;Patient/family education   PT Goals Acute Rehab PT Goals PT Goal Formulation: With patient Time For Goal Achievement: 04/27/12 Potential to Achieve Goals: Good Pt will go Supine/Side to Sit: with supervision PT Goal: Supine/Side to Sit - Progress: Goal set today Pt will go Sit to Supine/Side: with supervision PT Goal: Sit to Supine/Side - Progress: Goal set today Pt will go Sit to Stand: with min assist PT Goal: Sit to Stand - Progress: Goal set today Pt will go Stand to Sit: with min assist PT Goal: Stand to Sit - Progress: Goal set today Pt will Transfer Bed to Chair/Chair to Bed: with min assist PT Transfer Goal: Bed to Chair/Chair to Bed - Progress: Goal set today Pt will Ambulate: 51 - 150 feet;with min assist;with rolling walker PT Goal: Ambulate - Progress: Goal set today  Visit Information  Last PT Received On: 04/13/12 Assistance Needed: +2 PT/OT Co-Evaluation/Treatment: Yes    Subjective Data  Subjective: I want to eat.   Patient Stated Goal: None Stated.     Prior Functioning  Home Living Lives With: Daughter Available Help at Discharge: Family;Available 24 hours/day (dtr not available to confirm) Type of Home: House Home Access: Stairs to enter [employer] of Steps: 2 Entrance Stairs-Rails: Left;Right;Can reach both Home Layout: One level Bathroom  Shower/Tub: Multimedia programmer: Handicapped height Home Adaptive Equipment: Shower chair with back;Grab bars in shower;Walker - rolling Additional Comments: Dtr not present to [employer] Prior Function Level of Independence: Independent Able to Take Stairs?: Yes Driving: Yes Vocation: Full time 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 Dominant Hand: Right    Cognition  Overall Cognitive Status: Impaired Area of Impairment: Attention;Following commands;Safety/judgement;Awareness of errors;Awareness of deficits;Problem solving Arousal/Alertness: Awake/alert Orientation Level: Appears intact for tasks assessed (slow to respond, but accurate) Behavior During Session: Restless Current Attention Level: Sustained Attention - Other Comments: 10 seconds Following Commands: Follows one step commands with increased time Safety/Judgement: Decreased safety judgement for tasks assessed;Impulsive Awareness of Errors: Assistance required to identify errors made;Assistance required to correct errors made Problem Solving: mod A Cognition - Other Comments: pt focused on lunch and eating.      Extremity/Trunk Assessment Right Upper Extremity Assessment RUE ROM/Strength/Tone: Deficits RUE ROM/Strength/Tone Deficits: shoulder 2/5; elbow  and hand 3-/5 RUE Coordination: Deficits RUE Coordination Deficits: unable to manipulate feeding utensils Left Upper Extremity Assessment LUE ROM/Strength/Tone: Deficits LUE ROM/Strength/Tone Deficits: shoulder 2/5; elbow distally 3-/5 Right Lower Extremity Assessment RLE ROM/Strength/Tone: Deficits RLE ROM/Strength/Tone Deficits: Grossly weak and debilitated, 4-/5 RLE Sensation: WFL - Light Touch Left Lower Extremity Assessment LLE ROM/Strength/Tone: Deficits LLE ROM/Strength/Tone Deficits: Grossly weak and debilitated, 4-/5 LLE Sensation: WFL - Light Touch Trunk Assessment Trunk  Assessment: Normal   Balance Balance Balance Assessed: Yes Static Sitting Balance Static Sitting - Balance Support: Bilateral upper extremity supported;Feet supported Static Sitting - Level of Assistance: 5: Stand by assistance Static Sitting - Comment/# of Minutes: pt able to sit EOB unsuporrted x61mins.    End of Session PT - End of Session Equipment Utilized During Treatment: Gait belt Activity Tolerance: Patient limited by fatigue Patient left: in chair;with call bell/phone within reach;with nursing in room Nurse Communication: Mobility status  GP     Catarina Hartshorn, [place] 04/13/2012, 1:45 PM

## 2012-04-13 NOTE — Evaluation (Signed)
Occupational Therapy Evaluation Patient Details Name: [name] MRN: [id number] DOB: April 29, 1946 Today's Date: 04/13/2012 Time: [id number] OT Time Calculation (min): 18 min  OT Assessment / Plan / Recommendation Clinical Impression  This 66 y.o. female admitted for septic shock.  Pt. required intubation, now extubated.  Pt. with encephalopathy.  Pt. demonstrates generalized weakness, and cognitive impairment that impacts her ability to perform BADLs.  Pt. will benefit from acute OT to maximize independence and safety with BADLs. Recommend CIR.      OT Assessment  Patient needs continued OT Services    Follow Up Recommendations  Supervision/Assistance - 24 hour;Inpatient Rehab    Barriers to Discharge None    Equipment Recommendations  Rolling walker with 5" wheels    Recommendations for Other Services Rehab consult  Frequency  Min 2X/week    Precautions / Restrictions Precautions Precautions: Fall Restrictions Weight Bearing Restrictions: No       ADL  Eating/Feeding: +1 Total assistance (Pt unable to manipulate utensils) Where Assessed - Eating/Feeding: Chair Grooming: Moderate assistance;Wash/dry face Where Assessed - Grooming: Supported sitting Upper Body Bathing: +1 Total assistance Where Assessed - Upper Body Bathing: Supported sitting Lower Body Bathing: +1 Total assistance Where Assessed - Lower Body Bathing: Supported sit to stand Upper Body Dressing: +1 Total assistance Where Assessed - Upper Body Dressing: Supported sitting Lower Body Dressing: +1 Total assistance Where Assessed - Lower Body Dressing: Supported sit to Lobbyist: +2 Total assistance Toilet Transfer: Patient Percentage: 50% Toilet Transfer Method: Stand pivot;Sit to Loss adjuster, chartered: Other (comment) Building services engineer) Toileting - Clothing Manipulation and Hygiene: +1 Total assistance Where Assessed - Best boy and Hygiene: Standing Tub/Shower  Transfer:  (Not appropriate) Equipment Used: Gait belt Transfers/Ambulation Related to ADLs: Pt. transferred bed to recliner with total A +2 (pt ~50%)    OT Diagnosis: Generalized weakness;Cognitive deficits  OT Problem List: Decreased strength;Decreased activity tolerance;Impaired balance (sitting and/or standing);Decreased coordination;Decreased cognition;Decreased safety awareness;Decreased knowledge of use of DME or AE;Obesity;Impaired UE functional use OT Treatment Interventions: Self-care/ADL training;Therapeutic exercise;DME and/or AE instruction;Therapeutic activities;Patient/family education;Balance training   OT Goals Acute Rehab OT Goals OT Goal Formulation: With patient Time For Goal Achievement: 04/27/12 Potential to Achieve Goals: Good ADL Goals Pt Will Perform Eating: Independently;Sitting, chair ADL Goal: Eating - Progress: Goal set today Pt Will Perform Grooming: with min assist;Standing at sink ADL Goal: Grooming - Progress: Goal set today Pt Will Perform Upper Body Bathing: with set-up;Sitting, chair;Sitting, edge of bed ADL Goal: Upper Body Bathing - Progress: Goal set today Pt Will Perform Lower Body Bathing: with min assist;Sit to stand from chair;Sit to stand from bed ADL Goal: Lower Body Bathing - Progress: Goal set today Pt Will Transfer to Toilet: with min assist;Ambulation;Comfort height toilet;3-in-1 ADL Goal: Toilet Transfer - Progress: Goal set today Pt Will Perform Toileting - Clothing Manipulation: with min assist;Standing ADL Goal: Toileting - Clothing Manipulation - Progress: Goal set today Pt Will Perform Toileting - Hygiene: with min assist;Sit to stand from 3-in-1/toilet ADL Goal: Toileting - Hygiene - Progress: Goal set today Additional ADL Goal #1: Pt. will demonstrate at least 4-/5 strength bil. UEs as needed for BADLs ADL Goal: Additional Goal #1 - Progress: Goal set today  Visit Information  Last OT Received On: 04/13/12 Assistance Needed:  +2 PT/OT Co-Evaluation/Treatment: Yes    Subjective Data  Subjective: "I want my food.  It's over there, and I can't get to it" Patient Stated Goal: Pt did not state   Prior  Functioning     Home Living Lives With: Daughter Available Help at Discharge: Family;Available 24 hours/day (dtr not available to confirm) Type of Home: House Home Access: Stairs to enter [employer] of Steps: 2 Entrance Stairs-Rails: Left;Right;Can reach both Home Layout: One level Bathroom Shower/Tub: Multimedia programmer: Handicapped height Home Adaptive Equipment: Shower chair with back;Grab bars in shower;Walker - rolling Additional Comments: Dtr not present to cofirm info Prior Function Level of Independence: Independent Able to Take Stairs?: Yes Driving: Yes Vocation: Full time 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 Dominant Hand: Right         Vision/Perception Praxis Praxis: Intact   Cognition  Overall Cognitive Status: Impaired Area of Impairment: Attention;Following commands;Safety/judgement;Awareness of errors;Awareness of deficits;Problem solving Arousal/Alertness: Awake/alert Orientation Level: Appears intact for tasks assessed (slow to respond, but accurate) Behavior During Session: Restless Current Attention Level: Sustained Attention - Other Comments: 10 seconds Following Commands: Follows one step commands with increased time Safety/Judgement: Decreased safety judgement for tasks assessed;Impulsive Awareness of Errors: Assistance required to identify errors made;Assistance required to correct errors made Problem Solving: mod A Cognition - Other Comments: pt focused on lunch and eating.      Extremity/Trunk Assessment Right Upper Extremity Assessment RUE ROM/Strength/Tone: Deficits RUE ROM/Strength/Tone Deficits: shoulder 2/5; elbow  and hand 3-/5 RUE Coordination: Deficits RUE  Coordination Deficits: unable to manipulate feeding utensils Left Upper Extremity Assessment LUE ROM/Strength/Tone: Deficits LUE ROM/Strength/Tone Deficits: shoulder 2/5; elbow distally 3-/5 Right Lower Extremity Assessment RLE ROM/Strength/Tone: Deficits RLE ROM/Strength/Tone Deficits: Grossly weak and debilitated, 4-/5 RLE Sensation: WFL - Light Touch Left Lower Extremity Assessment LLE ROM/Strength/Tone: Deficits LLE ROM/Strength/Tone Deficits: Grossly weak and debilitated, 4-/5 LLE Sensation: WFL - Light Touch Trunk Assessment Trunk Assessment: Normal     Mobility Bed Mobility Bed Mobility: Supine to Sit;Sitting - Scoot to Edge of Bed Supine to Sit: 1: +2 Total assist Supine to Sit: Patient Percentage: 50% Sitting - Scoot to Edge of Bed: 1: +2 Total assist Sitting - Scoot to Edge of Bed: Patient Percentage: 50% Details for Bed Mobility Assistance: assist for LEs and to lift shoulders off bed Transfers Transfers: Sit to Stand;Stand to Sit Sit to Stand: 1: +2 Total assist;From bed;With upper extremity assist Sit to Stand: Patient Percentage: 60% Stand to Sit: 1: +2 Total assist;To chair/3-in-1;With upper extremity assist Stand to Sit: Patient Percentage: 50% Details for Transfer Assistance: Pt. attempts to sit uncontrolled as she pivoted to recliner.  Maintains flexed posture     Shoulder Instructions     Exercise     Balance Balance Balance Assessed: Yes Static Sitting Balance Static Sitting - Balance Support: Bilateral upper extremity supported;Feet supported Static Sitting - Level of Assistance: 5: Stand by assistance Static Sitting - Comment/# of Minutes: pt able to sit EOB unsuporrted x14mins.     End of Session OT - End of Session Equipment Utilized During Treatment: Gait belt Activity Tolerance: Patient tolerated treatment well Patient left: in chair;with call bell/phone within reach;with nursing in room Nurse Communication: Mobility status  GO     Lucille Passy M 04/13/2012, 2:43 PM

## 2012-04-13 NOTE — Progress Notes (Signed)
NEURO HOSPITALIST PROGRESS NOTE   SUBJECTIVE:                                                                                                                        Patient is alert, able to tell me she is in [facility] hospital, year is 2013 but gets the months wrong (believes it is March)  OBJECTIVE:                                                                                                                            Vital signs in last 24 hours: Temp:  [97.5 F (36.4 C)-98.7 F (37.1 C)] 97.5 F (36.4 C) (10/25 0748) Pulse Rate:  [76-97] 84  (10/25 0600) Resp:  [12-38] 18  (10/25 0600) BP: (118-147)/(46-73) 135/71 mmHg (10/25 0600) SpO2:  [98 %-100 %] 100 % (10/25 0600)  Intake/Output from previous day: 10/24 0701 - 10/25 0700 In: 908.8 [I.V.:668.8; NG/GT:40; IV Piggyback:200] Out: 2595 [Urine:2295; Stool:300] Intake/Output this shift:   Nutritional status:    Past Medical History  Diagnosis Date  . Hypertension   . Pneumonia   . GERD (gastroesophageal reflux disease)   . Headache     Neurologic ROS negative with exception of above Musculoskeletal ROS none  Neurologic Exam:  Mental Status: Alert, oriented to [place] and year but not month.  Speech fluent with short word sentences.  Able to follow 2 step commands without difficulty. Cranial Nerves: II: Discs flat bilaterally; Visual fields intact to blink to threat, pupils equal, round, reactive to light  III,IV, VI: ptosis not present, extra-ocular motions intact bilaterally V,VII: Face symmetric, facial light touch sensation intact bilaterally VIII: hearing normal bilaterally IX,X: gag reflex present XI: bilateral shoulder shrug present XII: midline tongue extension Motor: Weak throughout with 4-/5 strength in all 4 extremities. No asymetry noted on exam.  Tone and bulk:normal tone throughout; no atrophy noted Sensory: Pinprick and light touch intact throughout,  bilaterally Plantars: Right: downgoing   Left: equivocal Cerebellar: normal finger-to-nose,  CV: pulses palpable throughout     Lab Results: No results found for this basename: cbc, bmp, coags, chol, tri, ldl, hga1c   Lipid Panel  Basename 04/11/12 0420  CHOL --  TRIG 279*  HDL --  CHOLHDL --  VLDL --  LDLCALC --    Studies/Results: Dg Abd 1 View  04/11/2012  *RADIOLOGY REPORT*  Clinical Data: NG tube placement  ABDOMEN - 1 VIEW  Comparison: None.  Findings: Single fluoroscopic spot image demonstrates an enteric tube terminating in the stomach.  5 ml Omnipaque was injected via tube to firm intraluminal location.  IMPRESSION: Enteric tube terminating in the stomach.   Original Report Authenticated By: Julian Hy, M.D.    Dg Chest Port 1 View  04/12/2012  *RADIOLOGY REPORT*  Clinical Data: Shortness of breath, pleural effusion, follow-up, history hypertension  PORTABLE CHEST - 1 VIEW  Comparison: Portable exam 0520 hours compared to 04/11/2012  Findings: Endotracheal tube and moved. Nasogastric tube tip projects over the distal esophagus. Right jugular line stable. Enlargement of cardiac silhouette with pulmonary vascular congestion and perihilar infiltrates likely pulmonary edema. Small left pleural effusion and basilar atelectasis. No pneumothorax.  IMPRESSION: Probable pulmonary edema. Nasogastric tube has been partially withdrawn with tip now projecting over the distal esophagus, recommend advancing tube 7 cm.   Original Report Authenticated By: Burnetta Sabin, M.D.    Dg Naso G Tube Plc W/fl-no Rad  04/12/2012  CLINICAL DATA: Dysphagia and altered mental status, needs for enteral  nutrition and medications, unable to pass NG tube at bedside   NASO G TUBE PLACEMENT WITH FLUORO  Fluoroscopy was utilized by the requesting physician.  No radiographic  interpretation.      MEDICATIONS                                                                                                                         Scheduled:   . antiseptic oral rinse  15 mL Mouth Rinse QID  . aspirin EC  81 mg Oral Daily  . chlorhexidine  15 mL Mouth Rinse BID  . ciprofloxacin  400 mg Intravenous Q12H  . heparin subcutaneous  5,000 Units Subcutaneous Q8H  . MIND-USA study drug  (PI-Feinstein)  2 mL Intravenous Q12H  . multivitamin  5 mL Per Tube Daily  . DISCONTD: amiodarone  400 mg Oral BID  . DISCONTD: clonazePAM  0.5 mg Oral QHS  . DISCONTD: free water  200 mL Per Tube Q6H    ASSESSMENT/PLAN:                                                                                                               Patient Active Hospital Problem List:  Acute encephalopathy (04/04/2012)   Assessment:  Etiology remains likely to metabolic and infectious issues. Significantly improved since 10/23.  Able to follow verbal commands and explain to me she is in the hospital year is 2013.    Plan: Continue to treat underlying metabolic/infectious processes.  No further recommendations. Neurology will S/O. Feel free to call with questions.     Etta Quill PA-C [facility] [phone]  04/13/2012, 8:08 AM

## 2012-04-13 NOTE — Progress Notes (Signed)
Rehab Admissions Coordinator Note:  Patient was screened by Cleatrice Burke for appropriateness for an Inpatient Acute Rehab Consult. Rehab Consult already ordered by RN CM.    Cleatrice Burke, RN 04/13/2012, 2:31 PM  I can be reached at [phone].

## 2012-04-13 NOTE — Progress Notes (Addendum)
[facility] PROGRESS NOTE  [name] [id number] DOB: 1945/10/21 DOA: 04/03/2012 PCP: [name], MD  Assessment/Plan: Principal Problem:  *Septic shock due to E. Coli- Pyelonephritis- Bacteremia  She has been on appropriate antibiotic coverage since 10/15- would continue for total 2 wks.   Active Problems:   Nephrolithiasis S/p stenting of Left Ureter with double J stent- will need outpt f/u with Urology for removal of stent. Per Urology note, the stent can remain for up to 6 months   AKI (acute kidney injury)/ ATN Now resolving   NSTEMI Suspected to be demand ischemia, no WMA on repeat ECHO  Septic Cardiomyopathy with Ef of [id number] Acute systolic CHF Cardiology following- repeat ECHO reveals EF of 60-65 % and no WMA She was given Lasix x 2 and improved appropriately  A-fib Currently on Amiodarone with [place] [facility] managing- recommendations are to transition to 200mg  daily at discharge for a total of 30 days.  CHADS 2 score is 1 Cardiology does not agree with long term anticoagulation   Hypoxemia/ Acute respiratory failure suspected to be due to septic shock- PCCM not convinced that she had an underlying pneumonia   Acute encephalopathy Due to sepsis and now resolved.  Shock liver Resolving.   Hypernatremia Encourage PO liquids and follow.   Thrombocytopenia Due to sepsis- improving.   Code Status: Full code  Disposition Plan: follow in SDU DVT prophylaxis: heparin   Brief narrative: 66 y/o female with HTN and history of nephrolithiasis who was admitted on 10/16 with septic shock and left flank pain - this was determined to be due to nephrolithiasis, pyelonephritis and gram negative bacteremia. She was intubated on admission. She received a left double J stent due to obstructive nephrolithiasis  She developed A-fib with RVR, an NSTEMI (demand ischemia with a Peak Troponin on 1.49) and septic cardiomyopathy with an EF of 20-25%. She  was encephalopathic, developed ATN, shock liver and thrombocytopenia.  She was transferred to [facility] on 10/25.   Consultants:  Neurology  Urology  Cardiology  Procedures: Lines: 10/15 R IJ (EDP) >> removed ETT: 10/15>>>10/23   Antibiotics: 10/15 zosyn x1  10/15 vanc (severe cap) >>DC  10/15 ceftriaxone (pyelo, severe CAP) >>10/16  10/15 azithro (severe cap) >>10/16  10/16: levofloxacin >>>10/18  10/16: ceftaz>>>10/18  10/18 ceftriaxone (urine and blood cult pos ecoli)>>>10/22 rash  10/22 cipro>>>   HPI/Subjective: Pt alert, laying in bed, asking for water. She is aware that she is in the hospital and is able to give me a history as far as why she is here.  She has no complaints other than a dry mouth.   Objective: Filed Vitals:   04/13/12 1200 04/13/12 1241 04/13/12 1347 04/13/12 1400  BP: 128/59   120/59  Pulse: 101 113 101 101  Temp: 97.8 F (36.6 C)  97.1 F (36.2 C)   TempSrc: Oral  Oral   Resp: 25   19  Height:      Weight:      SpO2: 100%  100% 99%    Intake/Output Summary (Last 24 hours) at 04/13/12 1506 Last data filed at 04/13/12 1300  Gross per 24 hour  Intake 1068.95 ml  Output   2470 ml  Net -1401.05 ml    Exam:   General:  Alert, oreinted and in no distress  Cardiovascular: RRR, no murmur  Respiratory: CTA b/l   Abdomen: soft, NT, ND, BS+  Ext: no c/c, she has moderate ankle edema  Data Reviewed: Basic Metabolic Panel:  Lab 04/13/12 0327 04/12/12 0350 04/11/12 0420 04/10/12 0440 04/09/12 0500  NA 148* 148* 145 148* 143  K 3.8 3.7 3.6 3.3* 3.1*  CL 117* 117* 114* 115* 111  CO2 20 22 19 22 23   GLUCOSE 98 113* 104* 108* 119*  BUN 42* 51* 63* 68* 68*  CREATININE 1.43* 1.66* 1.93* 2.04* 2.28*  CALCIUM 8.7 8.3* 8.2* 8.4 8.3*  MG -- -- -- -- --  PHOS -- -- -- -- --   Liver Function Tests:  Lab 04/11/12 0420 04/10/12 0440 04/08/12 0452 04/07/12 0410  AST 37 53* 77* 98*  ALT 57* 70* 108* 145*  ALKPHOS 195* 270* 255*  288*  BILITOT 0.2* 0.3 0.2* 0.4  PROT 5.2* 5.1* 4.8* 5.0*  ALBUMIN 1.8* 1.7* 1.6* 1.8*   No results found for this basename: LIPASE:5,AMYLASE:5 in the last 168 hours  Lab 04/09/12 1605  AMMONIA 29   CBC:  Lab 04/13/12 0327 04/12/12 0350 04/11/12 0420 04/10/12 0440 04/09/12 0500  WBC 14.3* 17.9* 24.5* 24.7* 24.8*  NEUTROABS -- -- -- -- --  HGB 10.6* 9.5* 10.3* 10.3* 10.9*  HCT 33.1* 28.8* 29.6* 30.0* 31.4*  MCV 88.0 86.5 85.3 82.9 82.2  PLT 146* 135* 149* 109* 84*   Cardiac Enzymes: No results found for this basename: CKTOTAL:5,CKMB:5,CKMBINDEX:5,TROPONINI:5 in the last 168 hours BNP (last 3 results)  Basename 04/04/12 0210  PROBNP 8289.0*   CBG:  Lab 04/12/12 1613 04/12/12 1214 04/12/12 0754 04/12/12 0441 04/11/12 2346  GLUCAP 77 110* 97 113* 106*    Recent Results (from the past 240 hour(s))  URINE CULTURE     Status: Normal   Collection Time   04/03/12 10:19 PM      Component Value Range Status Comment   Specimen Description URINE, RANDOM   Final    Special Requests CX ADDED AT 0332    Final    Culture  Setup Time 04/04/2012 03:42   Final    Colony Count >=100,000 COLONIES/ML   Final    Culture ESCHERICHIA COLI   Final    Report Status 04/05/2012 FINAL   Final    Organism ID, Bacteria ESCHERICHIA COLI   Final   CULTURE, BLOOD (ROUTINE X 2)     Status: Normal   Collection Time   04/03/12 10:20 PM      Component Value Range Status Comment   Specimen Description BLOOD RIGHT ARM   Final    Special Requests BOTTLES DRAWN AEROBIC AND ANAEROBIC 5CC EACH   Final    Culture  Setup Time 04/04/2012 04:51   Final    Culture     Final    Value: ESCHERICHIA COLI     16 Note: Gram Stain Report Called to,Read Back By and Verified With: ASHLEY HARRISON AT 8:10PM 10 13 BY [place]   Report Status 04/06/2012 FINAL   Final    Organism ID, Bacteria ESCHERICHIA COLI   Final   CULTURE, BLOOD (ROUTINE X 2)     Status: Normal   Collection Time   04/03/12 10:30 PM      Component  Value Range Status Comment   Specimen Description BLOOD RIGHT HAND   Final    Special Requests BOTTLES DRAWN AEROBIC ONLY 1CC   Final    Culture  Setup Time 04/04/2012 04:51   Final    Culture     Final    Value: ESCHERICHIA COLI     Note: SUSCEPTIBILITIES PERFORMED ON PREVIOUS CULTURE WITHIN THE LAST 5 DAYS.  16 Note: Gram Stain Report Called to,Read Back By and Verified With: ASHLEY HARRISON AT 8:10PM 10 13 BY [id number]   Report Status 04/06/2012 FINAL   Final   MRSA PCR SCREENING     Status: Normal   Collection Time   04/04/12  2:05 AM      Component Value Range Status Comment   MRSA by PCR NEGATIVE  NEGATIVE Final   CULTURE, BLOOD (ROUTINE X 2)     Status: Normal   Collection Time   04/06/12 11:20 AM      Component Value Range Status Comment   Specimen Description BLOOD ARM RIGHT   Final    Special Requests BOTTLES DRAWN AEROBIC AND ANAEROBIC 10CC   Final    Culture  Setup Time 04/06/2012 21:41   Final    Culture NO GROWTH 5 DAYS   Final    Report Status 04/12/2012 FINAL   Final   CULTURE, BLOOD (ROUTINE X 2)     Status: Normal   Collection Time   04/06/12 11:35 AM      Component Value Range Status Comment   Specimen Description BLOOD ARM RIGHT   Final    Special Requests BOTTLES DRAWN AEROBIC AND ANAEROBIC 10CC   Final    Culture  Setup Time 04/06/2012 21:40   Final    Culture NO GROWTH 5 DAYS   Final    Report Status 04/12/2012 FINAL   Final   GRAM STAIN     Status: Normal   Collection Time   04/07/12  9:22 AM      Component Value Range Status Comment   Specimen Description URINE, RANDOM   Final    Special Requests NONE   Final    Gram Stain     Final    Value: URINE CYTOSPIN     WBC PRESENT,BOTH PMN AND MONONUCLEAR     NEGATIVE FOR BACTERIA     Gram Stain Report Called to,Read Back By and Verified With: SANDOVAL J,RN 1025 04/07/12 SCALES H   Report Status 04/07/2012 FINAL   Final   CLOSTRIDIUM DIFFICILE BY PCR     Status: Normal   Collection Time   04/09/12  11:39 AM      Component Value Range Status Comment   C difficile by pcr NEGATIVE  NEGATIVE Final      Studies: Reviewed       Scheduled Meds:   . antiseptic oral rinse  15 mL Mouth Rinse QID  . aspirin EC  81 mg Oral Daily  . chlorhexidine  15 mL Mouth Rinse BID  . ciprofloxacin  400 mg Intravenous Q12H  . heparin subcutaneous  5,000 Units Subcutaneous Q8H  . MIND-USA study drug  (PI-Feinstein)  2 mL Intravenous Q12H  . multivitamin  5 mL Per Tube Daily  . DISCONTD: amiodarone  400 mg Oral BID   Continuous Infusions:   . sodium chloride 500 mL (04/12/12 1800)  . amiodarone (NEXTERONE PREMIX) 360 mg/200 mL dextrose 30 mg/hr (04/12/12 1830)    ________________________________________________________________________  Time spent: 66 min   [facility]  [facility] Pager [phone] If 8PM-8AM, please contact night-coverage at [email or web address], password [facility] 04/13/2012, 3:06 PM  LOS: 10 days

## 2012-04-13 NOTE — Procedures (Signed)
Objective Swallowing Evaluation: Fiberoptic Endoscopic Evaluation of Swallowing  Patient Details  Name: [name] MRN: [id number] Date of Birth: 08-Jul-1945  Today's Date: 04/13/2012 Time: 0840-0902 SLP Time Calculation (min): 22 min  Past Medical History:  Past Medical History  Diagnosis Date  . Hypertension   . Pneumonia   . GERD (gastroesophageal reflux disease)   . Headache    Past Surgical History:  Past Surgical History  Procedure Date  . Abdominal hysterectomy   . Cholecystectomy   . Cystoscopy w/ ureteral stent placement 04/04/2012    Procedure: CYSTOSCOPY WITH RETROGRADE PYELOGRAM/URETERAL STENT PLACEMENT;  Surgeon: Ailene Rud, MD;  Location: [place];  Service: Urology;  Laterality: Left;   HPI:   This is a 66 y/o female admitted on 10/16 to [facility] with septic shock likely from pyelonephritis from left obstructing stone in the UPG     Assessment / Plan / Recommendation Clinical Impression  Dysphagia Diagnosis: Mild oral phase dysphagia;Mild pharyngeal phase dysphagia Clinical impression: Patient presents with a mild sensory-motor based oropharyngeal dysphagia characterized by inconsistently delayed swallow initiation, greatest with thin liquids, but with full airway protection. No aspiration or penetration observed. Generalized oral, laryngeal, and pharyngeal weakness resulting in  mild pharyngeal residuals post swallow across consistencies which patient clears with spontaneous dry swallows given extra time. Aspiration risk remains mild given general deconditioning and mild dysphagia however recommend patient advance to a regular diet, thin liquids with safe swallowing precautions to decrease risks. Suspect that overall function will continue to improve given time off vent.       Treatment Recommendation  Therapy as outlined in treatment plan below    Diet Recommendation Regular;Thin liquid   Liquid Administration via: Cup;Straw Medication Administration:  Whole meds with puree Supervision: Patient able to self feed;Full supervision/cueing for compensatory strategies Compensations: Slow rate;Small sips/bites Postural Changes and/or Swallow Maneuvers: Seated upright 90 degrees    Other  Recommendations Oral Care Recommendations: Oral care BID   Follow Up Recommendations  None    Frequency and Duration min 2x/week  2 weeks       SLP Swallow Goals Patient will utilize recommended strategies during swallow to increase swallowing safety with: Supervision/safety Swallow Study Goal #2 - Progress: Not met   General HPI:  This is a 66 y/o female admitted on 10/16 to [facility] with septic shock likely from pyelonephritis from left obstructing stone in the UPG Type of Study: Fiberoptic Endoscopic Evaluation of Swallowing Reason for Referral: Objectively evaluate swallowing function Previous Swallow Assessment: Bedside swallow evaluation complete 10/24 indicated need for FEES to objectively evaluate swallow.  Diet Prior to this Study: NPO (except meds) Temperature Spikes Noted: No Respiratory Status: Supplemental O2 delivered via (comment) History of Recent Intubation: Yes Length of Intubations (days): 9 days Date extubated: 04/11/12 Behavior/Cognition: Alert;Cooperative Oral Cavity - Dentition: Adequate natural dentition Oral Motor / Sensory Function: Within functional limits Self-Feeding Abilities: Needs assist Patient Positioning: Upright in bed Baseline Vocal Quality: Low vocal intensity;Hoarse Volitional Cough: Weak Volitional Swallow: Able to elicit Anatomy: Other (Comment) (? granuloma tissue vs irritation on bilateral vocal cords) Pharyngeal Secretions: Normal    Reason for Referral Objectively evaluate swallowing function   Oral Phase Oral Preparation/Oral Phase Oral Phase: Impaired Oral - Solids Oral - Mechanical Soft: Delayed oral transit (mild but functional with full oral clearance)   Pharyngeal Phase Pharyngeal  Phase Pharyngeal Phase: Impaired Pharyngeal - Nectar Pharyngeal - Nectar Teaspoon: Delayed swallow initiation;Premature spillage to valleculae;Reduced pharyngeal peristalsis;Reduced epiglottic inversion;Reduced anterior laryngeal mobility;Reduced  laryngeal elevation;Reduced tongue base retraction;Pharyngeal residue - valleculae;Pharyngeal residue - pyriform sinuses Pharyngeal - Nectar Cup: Delayed swallow initiation;Premature spillage to valleculae;Reduced pharyngeal peristalsis;Reduced epiglottic inversion;Reduced anterior laryngeal mobility;Reduced laryngeal elevation;Reduced tongue base retraction;Pharyngeal residue - valleculae;Pharyngeal residue - pyriform sinuses Pharyngeal - Thin Pharyngeal - Thin Cup: Delayed swallow initiation;Reduced pharyngeal peristalsis;Reduced epiglottic inversion;Reduced anterior laryngeal mobility;Reduced laryngeal elevation;Reduced tongue base retraction;Pharyngeal residue - valleculae;Pharyngeal residue - pyriform sinuses;Premature spillage to pyriform sinuses Pharyngeal - Thin Straw: Delayed swallow initiation;Premature spillage to valleculae;Reduced pharyngeal peristalsis;Reduced epiglottic inversion;Reduced anterior laryngeal mobility;Reduced laryngeal elevation;Reduced tongue base retraction;Pharyngeal residue - valleculae;Pharyngeal residue - pyriform sinuses Pharyngeal - Solids Pharyngeal - Puree: Delayed swallow initiation;Premature spillage to valleculae;Reduced pharyngeal peristalsis;Reduced epiglottic inversion;Reduced anterior laryngeal mobility;Reduced laryngeal elevation;Reduced tongue base retraction;Pharyngeal residue - valleculae;Pharyngeal residue - pyriform sinuses Pharyngeal - Mechanical Soft: Delayed swallow initiation;Reduced pharyngeal peristalsis;Reduced epiglottic inversion;Reduced anterior laryngeal mobility;Reduced laryngeal elevation;Reduced tongue base retraction  Cervical Esophageal Phase    GO   Gabriel Rainwater MA, CCC-SLP [phone]   Cervical Esophageal Phase Cervical Esophageal Phase: [facility]         Jacquelin Krajewski Meryl 04/13/2012, 9:10 AM

## 2012-04-14 MED ORDER — ZOLPIDEM TARTRATE 5 MG PO TABS
5.0000 mg | ORAL_TABLET | Freq: Every evening | ORAL | Status: DC | PRN
  Administered 2012-04-14: 5 mg via ORAL
  Filled 2012-04-14: qty 1

## 2012-04-14 MED ORDER — BIOTENE DRY MOUTH MT LIQD
15.0000 mL | Freq: Two times a day (BID) | OROMUCOSAL | Status: DC
  Administered 2012-04-14: 15 mL via OROMUCOSAL

## 2012-04-14 MED ORDER — DIPHENOXYLATE-ATROPINE 2.5-0.025 MG PO TABS
1.0000 | ORAL_TABLET | Freq: Once | ORAL | Status: AC
  Administered 2012-04-14: 1 via ORAL
  Filled 2012-04-14: qty 1

## 2012-04-14 MED ORDER — HYDROCHLOROTHIAZIDE 12.5 MG PO CAPS
12.5000 mg | ORAL_CAPSULE | Freq: Every day | ORAL | Status: DC
  Administered 2012-04-14 – 2012-04-16 (×3): 12.5 mg via ORAL
  Filled 2012-04-14 (×3): qty 1

## 2012-04-14 MED ORDER — BIMATOPROST 0.01 % OP SOLN
1.0000 [drp] | Freq: Every day | OPHTHALMIC | Status: DC
  Administered 2012-04-14 – 2012-04-16 (×3): 1 [drp] via OPHTHALMIC
  Filled 2012-04-14 (×3): qty 2.5

## 2012-04-14 MED ORDER — AMIODARONE HCL 200 MG PO TABS
400.0000 mg | ORAL_TABLET | Freq: Two times a day (BID) | ORAL | Status: DC
  Administered 2012-04-14 – 2012-04-17 (×7): 400 mg via ORAL
  Filled 2012-04-14 (×8): qty 2

## 2012-04-14 MED ORDER — ALBUTEROL SULFATE (5 MG/ML) 0.5% IN NEBU: 2.5000 mg | INHALATION_SOLUTION | Freq: Four times a day (QID) | RESPIRATORY_TRACT | Status: DC | PRN

## 2012-04-14 MED ORDER — SACCHAROMYCES BOULARDII 250 MG PO CAPS
250.0000 mg | ORAL_CAPSULE | Freq: Two times a day (BID) | ORAL | Status: DC
  Administered 2012-04-14 – 2012-04-17 (×7): 250 mg via ORAL
  Filled 2012-04-14 (×8): qty 1

## 2012-04-14 NOTE — Progress Notes (Signed)
Speech Language Pathology Dysphagia Treatment Patient Details Name: NIGERIA RICOTTA MRN: [id number] DOB: 1945/12/14 Today's Date: 04/14/2012 Time: [id number] SLP Time Calculation (min): 25 min  Assessment / Plan / Recommendation Clinical Impression  Pt seen to assess tolerance of po intake after FEES study.  Pt intake noted to be 40% yesterday and 80% today.  Per RN, pt tolerating po, was given medication with water with throat clearing after swallow.  Pt unaware of this throat clearing, not was it documented to occur on bedside eval or FEES study.  RN reviewed swallow precaution sign advising pills be given with applesauce= follow w/water.  Pt reports h/o GERD and states she has tolerated her meals well thus far.    As pt with all vital signs stable, recommend to continue regular/thin with strict precautions.  Continued strict asp and reflux precautions indicated due to current medical condition.  Pt repeated aspiration precautions to this SLP including trying to exhale after swallow for maximal airway protection, staying upright after PO for reflux precautions, taking SMALL single sips and taking rest breaks if short of breath.    SLP to follow up Monday for dietary tolerance and further education.     Diet Recommendation  Initiate / Change Diet: Regular;Thin liquid    SLP Plan Continue with current plan of care   Pertinent Vitals/Pain Afebrile, 2 liters oxygen, clear lungs    Swallowing Goals  SLP Swallowing Goals Swallow Study Goal #2 - Progress: Progressing toward goal  General Temperature Spikes Noted: No Respiratory Status: Supplemental O2 delivered via (comment) Behavior/Cognition: Alert;Cooperative;Pleasant mood Oral Cavity - Dentition: Adequate natural dentition Patient Positioning: Upright in bed  Oral Cavity - Oral Hygiene   oral cavity was clean  Dysphagia Treatment Treatment focused on: Skilled observation of diet tolerance;Patient/family/caregiver  education Family/Caregiver Educated: pt Treatment Methods/Modalities: Skilled observation Patient observed directly with PO's: Yes Type of PO's observed: Thin liquids Feeding: Total assist Pharyngeal Phase Signs & Symptoms: Delayed throat clear (intermittent throat clearing, pt unaware) Type of cueing: Verbal Amount of cueing: Minimal (slow rate & attempt exhale a/swallow for airway protection)   [place], [place] [facility] SLP [phone]

## 2012-04-14 NOTE — Progress Notes (Signed)
[facility] PROGRESS NOTE  [name] [id number] DOB: 03-Jan-1946 DOA: 04/03/2012 PCP: [name], MD  Assessment/Plan: Principal Problem:  *Septic shock due to E. Coli- Pyelonephritis- Bacteremia  She has been on appropriate antibiotic coverage since 10/15- She is currently receiving IV Cipro- would continue for total 2 wks (discussed with ID)- end date would be 10/29   Active Problems:   Nephrolithiasis S/p stenting of Left Ureter with double J stent- will need outpt f/u with Urology for removal of stent. Per Urology note, the stent can remain for up to 6 months   AKI (acute kidney injury)/ ATN Now resolving - cont to hold ACE I  NSTEMI Suspected to be demand ischemia, peak troponin 1.49 on 10/16, no WMA on repeat ECHO  Septic Cardiomyopathy with Ef of [id number] Acute systolic CHF Cardiology following- repeat ECHO reveals EF of 60-65 % and no WMA She was given Lasix x 2 and improved appropriately  A-fib Currently on Amiodarone with [facility] managing- recommendations are to transition to 200mg  daily at discharge for a total of 30 days- will transition from Amio infusion to PO 400 BID per recommendations made by Dr Percival Spanish on 10/22 (see note) CHADS 2 score is 1 Cardiology does not feel she warrents long term anticoagulation at this time   Hypoxemia/ Acute respiratory failure suspected to be due to septic shock- PCCM not convinced that she had an underlying pneumonia Currently back to room air with excellent O2 sats.    Acute encephalopathy Due to sepsis and now resolved.  Shock liver Resolving.   Hypernatremia Encourage PO liquids and follow.   HTN Resumed HCTZ today- cont to hold ACE I while watching for further improvement in Cr.   Mild Pedal edema As above, resuming HCTZ  Diarrhea Pt has rectal tube- likely has loose stools from recent rube feeds and multiple antibiotics, will start florastor and give a dose of Lomotil x 1 today.    Thrombocytopenia Due to sepsis- improving.   Code Status: Full code  Disposition Plan: follow in SDU DVT prophylaxis: heparin   Brief narrative: 66 y/o female with HTN and history of nephrolithiasis who was admitted on 10/16 with septic shock and left flank pain - this was determined to be due to septic shock from nephrolithiasis, pyelonephritis and gram negative bacteremia. She was intubated on admission. She received a left double J stent on 10/16  For an impacted 45mm Left upper ureteral stone @ [facility] She developed A-fib with RVR, an NSTEMI (demand ischemia with a Peak Troponin on 1.49) and septic cardiomyopathy with an EF of 20-25%. In addition, she was encephalopathic, developed ATN, shock liver and thrombocytopenia.  She was extubated on 10/23.  She was transferred from [facility] service to [facility] on 10/25.   Consultants:  Neurology  Urology  Cardiology  Procedures: Lines: 10/15 R IJ (EDP) >> removed ETT: 10/15>>>10/23   Antibiotics: 10/15 zosyn x1  10/15 vanc (severe cap) >>DC  10/15 ceftriaxone (pyelo, severe CAP) >>10/16  10/15 azithro (severe cap) >>10/16  10/16: levofloxacin >>>10/18  10/16: ceftaz>>>10/18  10/18 ceftriaxone (urine and blood cult pos ecoli)>>>10/22 rash  10/22 cipro>>>   HPI/Subjective: Pt alert, laying in bed without any complaints. She is eating her meals well without any resultant GI issues. Her daughter is in the room. I have updated her and discussion plans for further disposition. Daughter and Patient live together and she (daughter) will prefer to take patient home with her after discharge and give her the appropriate care.  We agree that we will leave this decision open and will discuss it further after PT evaluates the patient.   Objective: Filed Vitals:   04/14/12 0100 04/14/12 0200 04/14/12 0400 04/14/12 0600  BP: 134/62 136/66 131/86   Pulse: 86 86 85   Temp:   98.3 F (36.8 C)   TempSrc:   Oral   Resp: 23 24 20     Height:      Weight:    108 kg (238 lb 1.6 oz)  SpO2: 100% 100% 100%     Intake/Output Summary (Last 24 hours) at 04/14/12 0842 Last data filed at 04/14/12 0600  Gross per 24 hour  Intake 1807.4 ml  Output   2375 ml  Net -567.6 ml    Exam:   General:  Alert, oreinted and in no distress  Cardiovascular: RRR, no murmur  Respiratory: CTA b/l   Abdomen: soft, NT, ND, BS+  Ext: no c/c, she has moderate ankle edema  Data Reviewed: Basic Metabolic Panel:  Lab [id number] 04/12/12 0350 04/11/12 0420 04/10/12 0440 04/09/12 0500  NA 148* 148* 145 148* 143  K 3.8 3.7 3.6 3.3* 3.1*  CL 117* 117* 114* 115* 111  CO2 20 22 19 22 23   GLUCOSE 98 113* 104* 108* 119*  BUN 42* 51* 63* 68* 68*  CREATININE 1.43* 1.66* 1.93* 2.04* 2.28*  CALCIUM 8.7 8.3* 8.2* 8.4 8.3*  MG -- -- -- -- --  PHOS -- -- -- -- --   Liver Function Tests:  Lab 04/11/12 0420 04/10/12 0440 04/08/12 0452  AST 37 53* 77*  ALT 57* 70* 108*  ALKPHOS 195* 270* 255*  BILITOT 0.2* 0.3 0.2*  PROT 5.2* 5.1* 4.8*  ALBUMIN 1.8* 1.7* 1.6*   No results found for this basename: LIPASE:5,AMYLASE:5 in the last 168 hours  Lab 04/09/12 1605  AMMONIA 29   CBC:  Lab 04/13/12 0327 04/12/12 0350 04/11/12 0420 04/10/12 0440 04/09/12 0500  WBC 14.3* 17.9* 24.5* 24.7* 24.8*  NEUTROABS -- -- -- -- --  HGB 10.6* 9.5* 10.3* 10.3* 10.9*  HCT 33.1* 28.8* 29.6* 30.0* 31.4*  MCV 88.0 86.5 85.3 82.9 82.2  PLT 146* 135* 149* 109* 84*   Cardiac Enzymes: No results found for this basename: CKTOTAL:5,CKMB:5,CKMBINDEX:5,TROPONINI:5 in the last 168 hours BNP (last 3 results)  Basename 04/04/12 0210  PROBNP 8289.0*   CBG:  Lab 04/12/12 1613 04/12/12 1214 04/12/12 0754 04/12/12 0441 04/11/12 2346  GLUCAP 77 110* 97 113* 106*    Recent Results (from the past 240 hour(s))  CULTURE, BLOOD (ROUTINE X 2)     Status: Normal   Collection Time   04/06/12 11:20 AM      Component Value Range Status Comment   Specimen Description  BLOOD ARM RIGHT   Final    Special Requests BOTTLES DRAWN AEROBIC AND ANAEROBIC 10CC   Final    Culture  Setup Time 04/06/2012 21:41   Final    Culture NO GROWTH 5 DAYS   Final    Report Status 04/12/2012 FINAL   Final   CULTURE, BLOOD (ROUTINE X 2)     Status: Normal   Collection Time   04/06/12 11:35 AM      Component Value Range Status Comment   Specimen Description BLOOD ARM RIGHT   Final    Special Requests BOTTLES DRAWN AEROBIC AND ANAEROBIC 10CC   Final    Culture  Setup Time 04/06/2012 21:40   Final    Culture NO GROWTH 5 DAYS  Final    Report Status 04/12/2012 FINAL   Final   GRAM STAIN     Status: Normal   Collection Time   04/07/12  9:22 AM      Component Value Range Status Comment   Specimen Description URINE, RANDOM   Final    Special Requests NONE   Final    Gram Stain     Final    Value: URINE CYTOSPIN     WBC PRESENT,BOTH PMN AND MONONUCLEAR     NEGATIVE FOR BACTERIA     Gram Stain Report Called to,Read Back By and Verified With: SANDOVAL J,RN 1025 04/07/12 SCALES H   Report Status 04/07/2012 FINAL   Final   CLOSTRIDIUM DIFFICILE BY PCR     Status: Normal   Collection Time   04/09/12 11:39 AM      Component Value Range Status Comment   C difficile by pcr NEGATIVE  NEGATIVE Final      Studies: Reviewed       Scheduled Meds:    . amiodarone  400 mg Oral BID  . antiseptic oral rinse  15 mL Mouth Rinse BID  . aspirin EC  81 mg Oral Daily  . bimatoprost  1 drop Both Eyes QHS  . ciprofloxacin  400 mg Intravenous Q12H  . heparin subcutaneous  5,000 Units Subcutaneous Q8H  . hydrochlorothiazide  12.5 mg Oral Daily  . multivitamin  5 mL Per Tube Daily  . DISCONTD: antiseptic oral rinse  15 mL Mouth Rinse QID  . DISCONTD: chlorhexidine  15 mL Mouth Rinse BID  . DISCONTD: MIND-USA study drug  (PI-Feinstein)  2 mL Intravenous Q12H   Continuous Infusions:    . amiodarone (NEXTERONE PREMIX) 360 mg/200 mL dextrose 30 mg/hr (04/13/12 1948)  . DISCONTD: sodium  chloride 500 mL (04/12/12 1800)    ________________________________________________________________________  Time spent: 35 min   [facility] Pager [phone] If 8PM-8AM, please contact night-coverage at [email or web address], password [facility] 04/14/2012, 8:42 AM  LOS: 11 days

## 2012-04-15 LAB — BASIC METABOLIC PANEL
BUN: 29 mg/dL — ABNORMAL HIGH (ref 6–23)
CO2: 24 mEq/L (ref 19–32)
Calcium: 8.7 mg/dL (ref 8.4–10.5)
Chloride: 110 mEq/L (ref 96–112)
Creatinine, Ser: 1.45 mg/dL — ABNORMAL HIGH (ref 0.50–1.10)
GFR calc Af Amer: 42 mL/min — ABNORMAL LOW (ref >90)
GFR calc non Af Amer: 36 mL/min — ABNORMAL LOW (ref >90)
Glucose, Bld: 131 mg/dL — ABNORMAL HIGH (ref 70–99)
Potassium: 3.3 mEq/L — ABNORMAL LOW (ref 3.5–5.1)
Sodium: 144 mEq/L (ref 135–145)

## 2012-04-15 LAB — CBC
HCT: 33.1 % — ABNORMAL LOW (ref 36.0–46.0)
Hemoglobin: 10.7 g/dL — ABNORMAL LOW (ref 12.0–15.0)
MCH: 27.9 pg (ref 26.0–34.0)
MCHC: 32.3 g/dL (ref 30.0–36.0)
MCV: 86.2 fL (ref 78.0–100.0)
Platelets: 190 10*3/uL (ref 150–400)
RBC: 3.84 MIL/uL — ABNORMAL LOW (ref 3.87–5.11)
RDW: 16.1 % — ABNORMAL HIGH (ref 11.5–15.5)
WBC: 10 10*3/uL (ref 4.0–10.5)

## 2012-04-15 MED ORDER — ADULT MULTIVITAMIN W/MINERALS CH
1.0000 | ORAL_TABLET | Freq: Every day | ORAL | Status: DC
  Administered 2012-04-16 – 2012-04-17 (×2): 1 via ORAL
  Filled 2012-04-15 (×2): qty 1

## 2012-04-15 MED ORDER — POTASSIUM CHLORIDE CRYS ER 20 MEQ PO TBCR
40.0000 meq | EXTENDED_RELEASE_TABLET | Freq: Once | ORAL | Status: AC
  Administered 2012-04-15: 40 meq via ORAL
  Filled 2012-04-15: qty 2

## 2012-04-15 MED ORDER — CIPROFLOXACIN HCL 500 MG PO TABS
500.0000 mg | ORAL_TABLET | Freq: Two times a day (BID) | ORAL | Status: DC
  Administered 2012-04-15 – 2012-04-17 (×4): 500 mg via ORAL
  Filled 2012-04-15 (×6): qty 1

## 2012-04-15 NOTE — Progress Notes (Signed)
[facility] PROGRESS NOTE  [name] [id number] DOB: 08/22/1945 DOA: 04/03/2012 PCP: [name], MD  Assessment/Plan: Principal Problem:  *Septic shock due to E. Coli- Pyelonephritis- Bacteremia  She has been on appropriate antibiotic coverage since 10/15- She is currently receiving IV Cipro- would continue for total 2 wks (discussed with ID)- end date would be 10/29   Active Problems:   Nephrolithiasis S/p stenting of Left Ureter with double J stent- will need outpt f/u with Urology for removal of stent. Per Urology note, the stent can remain for up to 6 months   AKI (acute kidney injury)/ ATN Now resolving - cont to hold ACE I  NSTEMI Suspected to be demand ischemia, peak troponin 1.49 on 10/16, no WMA on repeat ECHO  Septic Cardiomyopathy with Ef of [id number] Acute systolic CHF Cardiology following- repeat ECHO reveals EF of 60-65 % and no WMA She was given Lasix x 2 and improved appropriately  A-fib Currently on Amiodarone 400 bid- recommendations are to transition to 200mg  daily at discharge for a total of 30 days- see recommendations made by Dr Percival Spanish on 10/22 (see note) CHADS 2 score is 1 Cardiology does not feel she warrents long term anticoagulation at this time   Hypoxemia/ Acute respiratory failure suspected to be due to septic shock- PCCM not convinced that she had an underlying pneumonia Currently back to room air with excellent O2 sats.    Acute encephalopathy Due to sepsis and now resolved.  Shock liver Resolving.   Hypernatremia Encourage PO liquids and follow.   HTN Resumed HCTZ - cont to hold ACE I while watching for further improvement in Cr.   Mild Pedal edema As above, resuming HCTZ  Diarrhea Pt has rectal tube- likely has loose stools from recent rube feeds and multiple antibiotics, have started florastor   Thrombocytopenia Due to sepsis- improving.   Code Status: Full code  Disposition Plan: follow in SDU DVT  prophylaxis: heparin   Brief narrative: 66 y/o female with HTN and history of nephrolithiasis who was admitted on 10/16 with septic shock and left flank pain - this was determined to be due to septic shock from nephrolithiasis, pyelonephritis and gram negative bacteremia. She was intubated on admission. She received a left double J stent on 10/16  For an impacted 84mm Left upper ureteral stone @ [facility] She developed A-fib with RVR, an NSTEMI (demand ischemia with a Peak Troponin on 1.49) and septic cardiomyopathy with an EF of 20-25%. In addition, she was encephalopathic, developed ATN, shock liver and thrombocytopenia.  She was extubated on 10/23.  She was transferred from [facility] service to [facility] on 10/25.   Consultants:  Neurology  Urology  Cardiology  Procedures: Lines: 10/15 R IJ (EDP) >> removed ETT: 10/15>>>10/23   Antibiotics: 10/15 zosyn x1  10/15 vanc (severe cap) >>DC  10/15 ceftriaxone (pyelo, severe CAP) >>10/16  10/15 azithro (severe cap) >>10/16  10/16: levofloxacin >>>10/18  10/16: ceftaz>>>10/18  10/18 ceftriaxone (urine and blood cult pos ecoli)>>>10/22 rash  10/22 cipro>>>   HPI/Subjective: Pt is doing well and has not complaints.   Objective: Filed Vitals:   04/15/12 0835 04/15/12 1158 04/15/12 1315 04/15/12 1700  BP: 119/56 131/56 120/52 123/66  Pulse: 93 97 88 88  Temp: 97.3 F (36.3 C) 98.2 F (36.8 C) 98.6 F (37 C) 98.8 F (37.1 C)  TempSrc: Oral Oral Oral Oral  Resp: 24 23 20 21   Height:      Weight:      SpO2:  99% 97% 99% 96%    Intake/Output Summary (Last 24 hours) at 04/15/12 2118 Last data filed at 04/15/12 1239  Gross per 24 hour  Intake    200 ml  Output   2175 ml  Net  -1975 ml    Exam:   General:  Alert, oreinted and in no distress  Cardiovascular: RRR, no murmur  Respiratory: CTA b/l   Abdomen: soft, NT, ND, BS+  Ext: no c/c, she has mild ankle edema  Data Reviewed: Basic Metabolic Panel:  Lab  [id number] 04/13/12 0327 04/12/12 0350 04/11/12 0420 04/10/12 0440  NA 144 148* 148* 145 148*  K 3.3* 3.8 3.7 3.6 3.3*  CL 110 117* 117* 114* 115*  CO2 24 20 22 19 22   GLUCOSE 131* 98 113* 104* 108*  BUN 29* 42* 51* 63* 68*  CREATININE 1.45* 1.43* 1.66* 1.93* 2.04*  CALCIUM 8.7 8.7 8.3* 8.2* 8.4  MG -- -- -- -- --  PHOS -- -- -- -- --   Liver Function Tests:  Lab 04/11/12 0420 04/10/12 0440  AST 37 53*  ALT 57* 70*  ALKPHOS 195* 270*  BILITOT 0.2* 0.3  PROT 5.2* 5.1*  ALBUMIN 1.8* 1.7*   No results found for this basename: LIPASE:5,AMYLASE:5 in the last 168 hours  Lab 04/09/12 1605  AMMONIA 29   CBC:  Lab 04/15/12 0942 04/13/12 0327 04/12/12 0350 04/11/12 0420 04/10/12 0440  WBC 10.0 14.3* 17.9* 24.5* 24.7*  NEUTROABS -- -- -- -- --  HGB 10.7* 10.6* 9.5* 10.3* 10.3*  HCT 33.1* 33.1* 28.8* 29.6* 30.0*  MCV 86.2 88.0 86.5 85.3 82.9  PLT 190 146* 135* 149* 109*   Cardiac Enzymes: No results found for this basename: CKTOTAL:5,CKMB:5,CKMBINDEX:5,TROPONINI:5 in the last 168 hours BNP (last 3 results)  Basename 04/04/12 0210  PROBNP 8289.0*   CBG:  Lab 04/12/12 1613 04/12/12 1214 04/12/12 0754 04/12/12 0441 04/11/12 2346  GLUCAP 77 110* 97 113* 106*    Recent Results (from the past 240 hour(s))  CULTURE, BLOOD (ROUTINE X 2)     Status: Normal   Collection Time   04/06/12 11:20 AM      Component Value Range Status Comment   Specimen Description BLOOD ARM RIGHT   Final    Special Requests BOTTLES DRAWN AEROBIC AND ANAEROBIC 10CC   Final    Culture  Setup Time 04/06/2012 21:41   Final    Culture NO GROWTH 5 DAYS   Final    Report Status 04/12/2012 FINAL   Final   CULTURE, BLOOD (ROUTINE X 2)     Status: Normal   Collection Time   04/06/12 11:35 AM      Component Value Range Status Comment   Specimen Description BLOOD ARM RIGHT   Final    Special Requests BOTTLES DRAWN AEROBIC AND ANAEROBIC 10CC   Final    Culture  Setup Time 04/06/2012 21:40   Final     Culture NO GROWTH 5 DAYS   Final    Report Status 04/12/2012 FINAL   Final   GRAM STAIN     Status: Normal   Collection Time   04/07/12  9:22 AM      Component Value Range Status Comment   Specimen Description URINE, RANDOM   Final    Special Requests NONE   Final    Gram Stain     Final    Value: URINE CYTOSPIN     WBC PRESENT,BOTH PMN AND MONONUCLEAR     NEGATIVE  FOR BACTERIA     Gram Stain Report Called to,Read Back By and Verified With: SANDOVAL J,RN 1025 04/07/12 SCALES H   Report Status 04/07/2012 FINAL   Final   CLOSTRIDIUM DIFFICILE BY PCR     Status: Normal   Collection Time   04/09/12 11:39 AM      Component Value Range Status Comment   C difficile by pcr NEGATIVE  NEGATIVE Final      Studies: Reviewed       Scheduled Meds:    . amiodarone  400 mg Oral BID  . aspirin EC  81 mg Oral Daily  . bimatoprost  1 drop Both Eyes QHS  . ciprofloxacin  500 mg Oral BID  . heparin subcutaneous  5,000 Units Subcutaneous Q8H  . hydrochlorothiazide  12.5 mg Oral Daily  . multivitamin with minerals  1 tablet Oral Daily  . potassium chloride  40 mEq Oral Once  . saccharomyces boulardii  250 mg Oral BID  . DISCONTD: ciprofloxacin  400 mg Intravenous Q12H  . DISCONTD: multivitamin  5 mL Per Tube Daily   Continuous Infusions:    ________________________________________________________________________  Time spent: 20 min   [facility] Pager [phone] If 8PM-8AM, please contact night-coverage at [email or web address], password [facility] 04/15/2012, 9:18 PM  LOS: 12 days

## 2012-04-15 NOTE — Progress Notes (Signed)
Received pt. As a transfer from 2600.pt was place on telemetry box [phone].Bed alarm was put on due to pt. Hight risk for fall,will keep assessing the need to moved pt. closer to the desk if neccessary.Safety plan was discussed with pt. And her family.pt. Has wounds under both breast with dressing on,the skin is open under the right breast.pt.'s skin is red and has excoriation and rash around the genital area through the bottom,pt. Has un stagable wounds on both feet that will need a further evaluation.keep monitoring pt. Closely and assessing her needs.

## 2012-04-15 NOTE — Progress Notes (Signed)
Dr. Wynelle Cleveland Was page and notified about pt's bilateral foot wounds.MD. Will come and assess the patient.keep monitoring pt. Closely.

## 2012-04-15 NOTE — Progress Notes (Signed)
Paged Dr. Wynelle Cleveland regarding pt being in and out of a-fib.  EKG obtained and shows a-fib.  Rate in low 100s.  BP 125/49; asymptomatic.  Will continue to monitor.  Loleta Dicker, RN

## 2012-04-15 NOTE — Progress Notes (Signed)
Pt report called to Maudry Mayhew, [place] on 6700.  Pt transferred via bed, tele with RN and NT.  Loleta Dicker, RN

## 2012-04-16 LAB — BASIC METABOLIC PANEL
BUN: 26 mg/dL — ABNORMAL HIGH (ref 6–23)
CO2: 24 mEq/L (ref 19–32)
Calcium: 8.4 mg/dL (ref 8.4–10.5)
Chloride: 110 mEq/L (ref 96–112)
Creatinine, Ser: 1.49 mg/dL — ABNORMAL HIGH (ref 0.50–1.10)
GFR calc Af Amer: 41 mL/min — ABNORMAL LOW (ref >90)
GFR calc non Af Amer: 35 mL/min — ABNORMAL LOW (ref >90)
Glucose, Bld: 94 mg/dL (ref 70–99)
Potassium: 3.6 mEq/L (ref 3.5–5.1)
Sodium: 143 mEq/L (ref 135–145)

## 2012-04-16 LAB — CBC
HCT: 29.9 % — ABNORMAL LOW (ref 36.0–46.0)
Hemoglobin: 9.7 g/dL — ABNORMAL LOW (ref 12.0–15.0)
MCH: 28 pg (ref 26.0–34.0)
MCHC: 32.4 g/dL (ref 30.0–36.0)
MCV: 86.2 fL (ref 78.0–100.0)
Platelets: 197 10*3/uL (ref 150–400)
RBC: 3.47 MIL/uL — ABNORMAL LOW (ref 3.87–5.11)
RDW: 15.7 % — ABNORMAL HIGH (ref 11.5–15.5)
WBC: 8.5 10*3/uL (ref 4.0–10.5)

## 2012-04-16 MED ORDER — FLUCONAZOLE 100 MG PO TABS
100.0000 mg | ORAL_TABLET | Freq: Every day | ORAL | Status: DC
  Administered 2012-04-16 – 2012-04-17 (×2): 100 mg via ORAL
  Filled 2012-04-16 (×2): qty 1

## 2012-04-16 MED ORDER — BOOST PLUS PO LIQD
237.0000 mL | ORAL | Status: DC
  Administered 2012-04-16 – 2012-04-17 (×2): 237 mL via ORAL
  Filled 2012-04-16 (×3): qty 237

## 2012-04-16 MED ORDER — FUROSEMIDE 10 MG/ML IJ SOLN
40.0000 mg | Freq: Once | INTRAMUSCULAR | Status: AC
  Administered 2012-04-16: 40 mg via INTRAVENOUS
  Filled 2012-04-16: qty 4

## 2012-04-16 NOTE — Progress Notes (Signed)
[facility] PROGRESS NOTE  [name] [id number] DOB: May 22, 1946 DOA: 04/03/2012 PCP: [name], MD  Assessment/Plan:   *Septic shock due to E. Coli- Pyelonephritis- Bacteremia - patient was in the ICU with refractory shock on levophed from 10/15 until - 04/06/12  She has been on appropriate antibiotic coverage since 10/15- She is currently receiving Cipro- would continue for total 2 wks - end date would be 10/29     Nephrolithiasis S/p stenting of Left Ureter with double J stent- will need outpt f/u with Urology for removal of stent. Per Urology note, the stent can remain for up to 6 months   AKI (acute kidney injury)/ ATN Now resolving - cont to hold ACE I  NSTEMI Suspected to be demand ischemia, peak troponin 1.49 on 10/16, no WMA on repeat ECHO  Septic Cardiomyopathy with Ef of [id number] Acute systolic CHF Cardiology following- repeat ECHO reveals EF of 60-65 % and no WMA We are giving lasix iv prn to maintain euvolemia  A-fib Currently on Amiodarone with [facility] managing- recommendations are to transition to 200mg  daily at discharge for a total of 30 days- will transition from Amio infusion to PO 400 BID per recommendations made by Dr Percival Spanish on 10/22 (see note) CHADS 2 score is 1 Cardiology does not feel she warrants long term anticoagulation at this time   Hypoxemia/ Acute respiratory failure suspected to be due to septic shock- PCCM not convinced that she had an underlying pneumonia.  Patient was on ventilator from 10/15 until 10/23  Currently back to room air with excellent O2 sats.    Acute encephalopathy Due to sepsis and now resolved.  Shock liver Resolving.   Hypernatremia Encourage PO liquids and follow. resolved   Mild Pedal edema As above, resuming HCTZ  Diarrhea Pt has rectal tube- likely has loose stools from recent rube feeds and multiple antibiotics, will start florastor and give a dose of Lomotil x 1 today.    Thrombocytopenia Due to sepsis- improving.   Code Status: Full code  Disposition Plan: follow in SDU DVT prophylaxis: heparin   Brief narrative: 66 y/o female with HTN and history of nephrolithiasis who was admitted on 10/16 with septic shock, on pressors until 10/18, and left flank pain - this was determined to be due to septic shock from nephrolithiasis, pyelonephritis and gram negative bacteremia. She was intubated on admission. She received a left double J stent on 10/16  For an impacted 80mm Left upper ureteral stone @ [facility] She developed A-fib with RVR, an NSTEMI (demand ischemia with a Peak Troponin on 1.49) and septic cardiomyopathy with an EF of 20-25%. In addition, she was encephalopathic, developed ATN, shock liver and thrombocytopenia.  She was extubated on 10/23.  She was transferred from [facility] service to [facility] on 10/25.   Consultants:  Neurology  Urology  Cardiology  Procedures: Lines: 10/15 R IJ (EDP) >> removed ETT: 10/15>>>10/23   Antibiotics: 10/15 zosyn x1  10/15 vanc (severe cap) >>DC  10/15 ceftriaxone (pyelo, severe CAP) >>10/16  10/15 azithro (severe cap) >>10/16  10/16: levofloxacin >>>10/18  10/16: ceftaz>>>10/18  10/18 ceftriaxone (urine and blood cult pos ecoli)>>>10/22 rash  10/22 cipro>>>   HPI/Subjective:  In the recliner   Objective: Filed Vitals:   04/15/12 1315 04/15/12 1700 04/15/12 2139 04/16/12 0441  BP: 120/52 123/66 138/58 125/72  Pulse: 88 88 86 90  Temp: 98.6 F (37 C) 98.8 F (37.1 C) 98.3 F (36.8 C) 98.7 F (37.1 C)  TempSrc: Oral  Oral Oral Oral  Resp: 20 21 24 20   Height:      Weight:   104.8 kg (231 lb 0.7 oz)   SpO2: 99% 96% 94% 95%    Intake/Output Summary (Last 24 hours) at 04/16/12 0805 Last data filed at 04/15/12 2147  Gross per 24 hour  Intake    200 ml  Output   1525 ml  Net  -1325 ml    Exam:   General:  Alert, oreinted and in no distress  Cardiovascular: RRR, no  murmur  Respiratory: CTA b/l   Abdomen: soft, NT, ND, BS+  Ext: no c/c, she has moderate ankle edema, toes with injury related to pressors,   Data Reviewed: Basic Metabolic Panel:  Lab [id number] 0648 04/15/12 0942 04/13/12 0327 04/12/12 0350 04/11/12 0420  NA 143 144 148* 148* 145  K 3.6 3.3* 3.8 3.7 3.6  CL 110 110 117* 117* 114*  CO2 24 24 20 22 19   GLUCOSE 94 131* 98 113* 104*  BUN 26* 29* 42* 51* 63*  CREATININE 1.49* 1.45* 1.43* 1.66* 1.93*  CALCIUM 8.4 8.7 8.7 8.3* 8.2*  MG -- -- -- -- --  PHOS -- -- -- -- --   Liver Function Tests:  Lab 04/11/12 0420 04/10/12 0440  AST 37 53*  ALT 57* 70*  ALKPHOS 195* 270*  BILITOT 0.2* 0.3  PROT 5.2* 5.1*  ALBUMIN 1.8* 1.7*   No results found for this basename: LIPASE:5,AMYLASE:5 in the last 168 hours  Lab 04/09/12 1605  AMMONIA 29   CBC:  Lab 04/16/12 0648 04/15/12 0942 04/13/12 0327 04/12/12 0350 04/11/12 0420  WBC 8.5 10.0 14.3* 17.9* 24.5*  NEUTROABS -- -- -- -- --  HGB 9.7* 10.7* 10.6* 9.5* 10.3*  HCT 29.9* 33.1* 33.1* 28.8* 29.6*  MCV 86.2 86.2 88.0 86.5 85.3  PLT 197 190 146* 135* 149*   Cardiac Enzymes: No results found for this basename: CKTOTAL:5,CKMB:5,CKMBINDEX:5,TROPONINI:5 in the last 168 hours BNP (last 3 results)  Basename 04/04/12 0210  PROBNP 8289.0*   CBG:  Lab 04/12/12 1613 04/12/12 1214 04/12/12 0754 04/12/12 0441 04/11/12 2346  GLUCAP 77 110* 97 113* 106*    Recent Results (from the past 240 hour(s))  CULTURE, BLOOD (ROUTINE X 2)     Status: Normal   Collection Time   04/06/12 11:20 AM      Component Value Range Status Comment   Specimen Description BLOOD ARM RIGHT   Final    Special Requests BOTTLES DRAWN AEROBIC AND ANAEROBIC 10CC   Final    Culture  Setup Time 04/06/2012 21:41   Final    Culture NO GROWTH 5 DAYS   Final    Report Status 04/12/2012 FINAL   Final   CULTURE, BLOOD (ROUTINE X 2)     Status: Normal   Collection Time   04/06/12 11:35 AM      Component Value Range  Status Comment   Specimen Description BLOOD ARM RIGHT   Final    Special Requests BOTTLES DRAWN AEROBIC AND ANAEROBIC 10CC   Final    Culture  Setup Time 04/06/2012 21:40   Final    Culture NO GROWTH 5 DAYS   Final    Report Status 04/12/2012 FINAL   Final   GRAM STAIN     Status: Normal   Collection Time   04/07/12  9:22 AM      Component Value Range Status Comment   Specimen Description URINE, RANDOM   Final    Special  Requests NONE   Final    Gram Stain     Final    Value: URINE CYTOSPIN     WBC PRESENT,BOTH PMN AND MONONUCLEAR     NEGATIVE FOR BACTERIA     Gram Stain Report Called to,Read Back By and Verified With: SANDOVAL J,RN 1025 04/07/12 SCALES H   Report Status 04/07/2012 FINAL   Final   CLOSTRIDIUM DIFFICILE BY PCR     Status: Normal   Collection Time   04/09/12 11:39 AM      Component Value Range Status Comment   C difficile by pcr NEGATIVE  NEGATIVE Final      Studies: Reviewed       Scheduled Meds:    . amiodarone  400 mg Oral BID  . aspirin EC  81 mg Oral Daily  . bimatoprost  1 drop Both Eyes QHS  . ciprofloxacin  500 mg Oral BID  . heparin subcutaneous  5,000 Units Subcutaneous Q8H  . hydrochlorothiazide  12.5 mg Oral Daily  . multivitamin with minerals  1 tablet Oral Daily  . potassium chloride  40 mEq Oral Once  . saccharomyces boulardii  250 mg Oral BID  . DISCONTD: ciprofloxacin  400 mg Intravenous Q12H  . DISCONTD: multivitamin  5 mL Per Tube Daily   Continuous Infusions:    ________________________________________________________________________     [facility] Pager [phone] If 8PM-8AM, please contact night-coverage at [email or web address], password [facility] 04/16/2012, 8:05 AM  LOS: 13 days

## 2012-04-16 NOTE — Progress Notes (Signed)
Speech Language Pathology Dysphagia Treatment Patient Details Name: [name] MRN: [id number] DOB: 02-Jun-1946 Today's Date: 04/16/2012 Time: [id number] SLP Time Calculation (min): 27 min  Assessment / Plan / Recommendation Clinical Impression  Pt seen for second check of diet tolerance. Pts MS much improved, fully alert and conversing fluently. Voice mildly dysphonic. Pt tolerating meals but complains that taste is not as good. SLp observed with over 4 oz of thin liqudis via cup and straw with no evidence of aspiration. With one bite of solid PO (cracker) pt with prolonged cough and throat irritation. Suspect dry crumbly textures irritating pts pharynx/esophgus. educated pt regarding moistening solids if this problem continues. Provided further verbal education regarding basic aspiration precautions. Ell education complete pt tolerating meals well. No SLP f/u needed at this point.     Diet Recommendation  Continue with Current Diet: Regular;Thin liquid    SLP Plan Discharge SLP treatment due to (comment);All goals met   Pertinent Vitals/Pain NA   Swallowing Goals  SLP Swallowing Goals Patient will consume recommended diet without observed clinical signs of aspiration with: Independent assistance Swallow Study Goal #1 - Progress: Met Patient will utilize recommended strategies during swallow to increase swallowing safety with: Supervision/safety Swallow Study Goal #2 - Progress: Met  General Temperature Spikes Noted: No Respiratory Status: Supplemental O2 delivered via (comment) Behavior/Cognition: Alert;Cooperative;Pleasant mood Oral Cavity - Dentition: Adequate natural dentition Patient Positioning: Upright in chair  Oral Cavity - Oral Hygiene Does patient have any of the following "at risk" factors?: None of the above Brush patient's teeth BID with toothbrush (using toothpaste with fluoride): Yes   Dysphagia Treatment Treatment focused on: Skilled observation of diet  tolerance;Patient/family/caregiver education Family/Caregiver Educated: pt Treatment Methods/Modalities: Skilled observation Patient observed directly with PO's: Yes Type of PO's observed: Thin liquids;Regular Feeding: Able to feed self Liquids provided via: Cup;Straw Pharyngeal Phase Signs & Symptoms: Immediate cough Type of cueing: Verbal Amount of cueing: Minimal   GO     Burtis Imhoff, Katherene Ponto 04/16/2012, 10:00 AM

## 2012-04-16 NOTE — Progress Notes (Signed)
Utilization review completed.  

## 2012-04-16 NOTE — Progress Notes (Signed)
Nutrition Follow-up  Intervention:    Boost PO daily to help with PO intake  RD to continue to follow nutrition care plan  Assessment:   FEES completed by SLP on 10/25, pt advanced to Heart Healthy diet at that time. Pt reports that her appetite is fair at this time, not at baseline.   Pt agreeable to Boost daily to help with PO intake. Discussed need for nutrition during hospitalization.  Diet Order:  Heart Healthy  Meds: Scheduled Meds:    . amiodarone  400 mg Oral BID  . aspirin EC  81 mg Oral Daily  . bimatoprost  1 drop Both Eyes QHS  . ciprofloxacin  500 mg Oral BID  . fluconazole  100 mg Oral Daily  . furosemide  40 mg Intravenous Once  . heparin subcutaneous  5,000 Units Subcutaneous Q8H  . multivitamin with minerals  1 tablet Oral Daily  . potassium chloride  40 mEq Oral Once  . saccharomyces boulardii  250 mg Oral BID  . DISCONTD: ciprofloxacin  400 mg Intravenous Q12H  . DISCONTD: hydrochlorothiazide  12.5 mg Oral Daily  . DISCONTD: multivitamin  5 mL Per Tube Daily   Continuous Infusions:   PRN Meds:.albuterol, zolpidem  Labs:  CMP     Component Value Date/Time   NA 143 04/16/2012 0648   K 3.6 04/16/2012 0648   CL 110 04/16/2012 0648   CO2 24 04/16/2012 0648   GLUCOSE 94 04/16/2012 0648   BUN 26* 04/16/2012 0648   CREATININE 1.49* 04/16/2012 0648   CALCIUM 8.4 04/16/2012 0648   PROT 5.2* 04/11/2012 0420   ALBUMIN 1.8* 04/11/2012 0420   AST 37 04/11/2012 0420   ALT 57* 04/11/2012 0420   ALKPHOS 195* 04/11/2012 0420   BILITOT 0.2* 04/11/2012 0420   GFRNONAA 35* 04/16/2012 0648   GFRAA 41* 04/16/2012 0648    CBG (last 3)  No results found for this basename: GLUCAP:3 in the last 72 hours  Sodium  Date/Time Value Range Status  04/16/2012  6:48 AM 143  135 - 145 mEq/L Final  04/15/2012  9:42 AM 144  135 - 145 mEq/L Final  04/13/2012  3:27 AM 148* 135 - 145 mEq/L Final    Potassium  Date/Time Value Range Status  04/16/2012  6:48 AM 3.6  3.5 -  5.1 mEq/L Final  04/15/2012  9:42 AM 3.3* 3.5 - 5.1 mEq/L Final  04/13/2012  3:27 AM 3.8  3.5 - 5.1 mEq/L Final    Phosphorus  Date/Time Value Range Status  04/04/2012  4:28 AM 4.2  2.3 - 4.6 mg/dL Final  04/04/2012  2:10 AM 2.2* 2.3 - 4.6 mg/dL Final    Magnesium  Date/Time Value Range Status  04/06/2012  4:09 AM 2.3  1.5 - 2.5 mg/dL Final  04/05/2012 12:16 PM 2.1  1.5 - 2.5 mg/dL Final  04/04/2012  4:28 AM 1.2* 1.5 - 2.5 mg/dL Final      Intake/Output Summary (Last 24 hours) at 04/16/12 1118 Last data filed at 04/15/12 2147  Gross per 24 hour  Intake      0 ml  Output   1525 ml  Net  -1525 ml  BM 10/28  Weight Status:  231 lb/104.8 kg trending back down   Estimated needs:  1800-2000 kcals, 95-110 gm protein, 2-2.2 liters fluid daily  Nutrition Dx: Inadequate oral intake now r/t poor appetite AEB pt report.  New Goal:  Intake to meet >90% of estimated nutrition needs, unmet.  Monitor: PO intake, weight  trend, labs, I/O  Inda Coke MS, [place], [place] Pager: [phone] After-hours pager: [phone]

## 2012-04-16 NOTE — Progress Notes (Signed)
Occupational Therapy Treatment Patient Details Name: [name] MRN: [id number] DOB: [birth year] Today's Date: 04/16/2012 Time: [id number] OT Time Calculation (min): 26 min  OT Assessment / Plan / Recommendation Comments on Treatment Session Pt demonstrating increased indepedence with bed mobility as well as increased bil UE strength.  Pt very motivated and would make a good CIR candidate.    Follow Up Recommendations  Supervision/Assistance - 24 hour;Inpatient Rehab    Barriers to Discharge       Equipment Recommendations  3 in 1 bedside comode    Recommendations for Other Services Rehab consult  Frequency Min 2X/week   Plan Discharge plan remains appropriate    Precautions / Restrictions     Pertinent Vitals/Pain See vitals    ADL  Grooming: Performed;Wash/dry face;Supervision/safety Where Assessed - Grooming: Unsupported sitting Transfers/Ambulation Related to ADLs: Not assessed this session ADL Comments: Focus of sesson on increasing activity tolerance with sitting tasks EOB.  Pt washed face using bil UE with min verbal cueing to stay on task (pt very talkative).  Pt performed pushups from bed x5 with min assist to increase bil UE strength in prep for functional transfers.  Pt also performed bil UE ROM exercises but would benefit from  theraband next session. Bil UE strength 3+/5 throughout.    OT Diagnosis:    OT Problem List:   OT Treatment Interventions:     OT Goals ADL Goals Pt Will Perform Grooming: with min assist;Standing at sink ADL Goal: Grooming - Progress: Progressing toward goals Additional ADL Goal #1: Pt. will demonstrate at least 4-/5 strength bil. UEs as needed for BADLs ADL Goal: Additional Goal #1 - Progress: Progressing toward goals  Visit Information  Last OT Received On: 04/16/12 Assistance Needed: +2 (for ambulation/transfers)    Subjective Data      Prior Functioning       Cognition  Overall Cognitive Status: Impaired Area of  Impairment: Attention;Following commands Arousal/Alertness: Awake/alert Orientation Level: Appears intact for tasks assessed Behavior During Session: [facility] for tasks performed Current Attention Level: Sustained Following Commands: Follows one step commands inconsistently;Follows multi-step commands inconsistently;Follows multi-step commands with increased time    Mobility  Shoulder Instructions Bed Mobility Bed Mobility: Sitting - Scoot to Edge of Bed;Supine to Sit;Sit to Supine Supine to Sit: 3: Mod assist;With rails Sitting - Scoot to Edge of Bed: 4: Min assist Sit to Supine: 4: Min assist;HOB flat Details for Bed Mobility Assistance: Assist to support trunk OOB.  Cueing for hand placement on rails.   Transfers Transfers: Not assessed       Exercises  General Exercises - Upper Extremity Shoulder Flexion: AROM;Both;Seated;20 reps Shoulder Extension: AROM;Both;20 reps;Seated Shoulder ABduction: AROM;Both;20 reps;Seated Shoulder ADduction: AROM;Both;20 reps;Seated Elbow Flexion: AROM;Both;20 reps;Seated Elbow Extension: AROM;Both;20 reps;Seated   Balance Balance Balance Assessed: Yes Static Sitting Balance Static Sitting - Balance Support: No upper extremity supported;Feet supported Static Sitting - Level of Assistance: 5: Stand by assistance Static Sitting - Comment/# of Minutes: Pt sat EOB 15 min unsupported   End of Session OT - End of Session Equipment Utilized During Treatment:  (none) Activity Tolerance: Patient tolerated treatment well Patient left: in bed;with call bell/phone within reach;with bed alarm set Nurse Communication: Mobility status  GO    04/16/2012 Darrol Jump OTR/L Pager [phone] Office [phone]  Darrol Jump 04/16/2012, 4:31 PM

## 2012-04-16 NOTE — Progress Notes (Signed)
Met with pt and her daughter to discuss CIR. Pt would benefit from CIR prior to going home. Faxed clinicals to [facility] and await authorization for CIR.  Please call for questions: [phone]

## 2012-04-16 NOTE — Research (Signed)
Patient agreed to continue in the [facility]. Study was explained to the patient and daughter. Patient previously enrolled into the study by daughter on 04/06/2012. I answered all questions and concerns before patient agreed to continue in the study and the consent was signed. A copy of the consent was given to the patient and a copy was placed in her shadow chart. Any questions or concerns please call [facility] at [phone]. Britani Beattie, Lauralyn Primes, RN

## 2012-04-17 ENCOUNTER — Encounter (HOSPITAL_COMMUNITY): Payer: Self-pay | Admitting: Physical Medicine and Rehabilitation

## 2012-04-17 ENCOUNTER — Inpatient Hospital Stay (HOSPITAL_COMMUNITY)
Admission: RE | Admit: 2012-04-17 | Discharge: 2012-04-24 | DRG: 945 | Disposition: A | Discharge status: Home Health | Payer: Medicare Other | Source: Ambulatory Visit | Attending: Physical Medicine & Rehabilitation | Admitting: Physical Medicine & Rehabilitation

## 2012-04-17 DIAGNOSIS — L02619 Cutaneous abscess of unspecified foot: Secondary | ICD-10-CM | POA: Diagnosis present

## 2012-04-17 DIAGNOSIS — E876 Hypokalemia: Secondary | ICD-10-CM | POA: Diagnosis present

## 2012-04-17 DIAGNOSIS — R6521 Severe sepsis with septic shock: Secondary | ICD-10-CM | POA: Diagnosis present

## 2012-04-17 DIAGNOSIS — I2489 Other forms of acute ischemic heart disease: Secondary | ICD-10-CM | POA: Diagnosis present

## 2012-04-17 DIAGNOSIS — I4891 Unspecified atrial fibrillation: Secondary | ICD-10-CM | POA: Diagnosis present

## 2012-04-17 DIAGNOSIS — N200 Calculus of kidney: Secondary | ICD-10-CM | POA: Diagnosis present

## 2012-04-17 DIAGNOSIS — G9340 Encephalopathy, unspecified: Secondary | ICD-10-CM | POA: Diagnosis present

## 2012-04-17 DIAGNOSIS — G9341 Metabolic encephalopathy: Secondary | ICD-10-CM

## 2012-04-17 DIAGNOSIS — A4189 Other specified sepsis: Secondary | ICD-10-CM

## 2012-04-17 DIAGNOSIS — R5381 Other malaise: Secondary | ICD-10-CM

## 2012-04-17 DIAGNOSIS — A419 Sepsis, unspecified organism: Secondary | ICD-10-CM | POA: Diagnosis present

## 2012-04-17 DIAGNOSIS — I214 Non-ST elevation (NSTEMI) myocardial infarction: Secondary | ICD-10-CM | POA: Diagnosis present

## 2012-04-17 DIAGNOSIS — Z5189 Encounter for other specified aftercare: Principal | ICD-10-CM

## 2012-04-17 DIAGNOSIS — L03119 Cellulitis of unspecified part of limb: Secondary | ICD-10-CM | POA: Diagnosis present

## 2012-04-17 DIAGNOSIS — B379 Candidiasis, unspecified: Secondary | ICD-10-CM | POA: Diagnosis present

## 2012-04-17 DIAGNOSIS — I248 Other forms of acute ischemic heart disease: Secondary | ICD-10-CM | POA: Diagnosis present

## 2012-04-17 DIAGNOSIS — H409 Unspecified glaucoma: Secondary | ICD-10-CM | POA: Diagnosis present

## 2012-04-17 DIAGNOSIS — I70209 Unspecified atherosclerosis of native arteries of extremities, unspecified extremity: Secondary | ICD-10-CM | POA: Diagnosis present

## 2012-04-17 DIAGNOSIS — R197 Diarrhea, unspecified: Secondary | ICD-10-CM | POA: Diagnosis present

## 2012-04-17 DIAGNOSIS — R6520 Severe sepsis without septic shock: Secondary | ICD-10-CM | POA: Diagnosis present

## 2012-04-17 LAB — CBC
HCT: 30.3 % — ABNORMAL LOW (ref 36.0–46.0)
Hemoglobin: 9.9 g/dL — ABNORMAL LOW (ref 12.0–15.0)
MCH: 28.2 pg (ref 26.0–34.0)
MCHC: 32.7 g/dL (ref 30.0–36.0)
MCV: 86.3 fL (ref 78.0–100.0)
Platelets: 215 10*3/uL (ref 150–400)
RBC: 3.51 MIL/uL — ABNORMAL LOW (ref 3.87–5.11)
RDW: 15.8 % — ABNORMAL HIGH (ref 11.5–15.5)
WBC: 8.3 10*3/uL (ref 4.0–10.5)

## 2012-04-17 LAB — BASIC METABOLIC PANEL
BUN: 24 mg/dL — ABNORMAL HIGH (ref 6–23)
CO2: 27 mEq/L (ref 19–32)
Calcium: 8.4 mg/dL (ref 8.4–10.5)
Chloride: 106 mEq/L (ref 96–112)
Creatinine, Ser: 1.6 mg/dL — ABNORMAL HIGH (ref 0.50–1.10)
GFR calc Af Amer: 37 mL/min — ABNORMAL LOW (ref >90)
GFR calc non Af Amer: 32 mL/min — ABNORMAL LOW (ref >90)
Glucose, Bld: 106 mg/dL — ABNORMAL HIGH (ref 70–99)
Potassium: 3.3 mEq/L — ABNORMAL LOW (ref 3.5–5.1)
Sodium: 143 mEq/L (ref 135–145)

## 2012-04-17 MED ORDER — PROCHLORPERAZINE 25 MG RE SUPP
12.5000 mg | Freq: Four times a day (QID) | RECTAL | Status: DC | PRN
  Filled 2012-04-17: qty 1

## 2012-04-17 MED ORDER — AMIODARONE HCL 400 MG PO TABS: 400.0000 mg | ORAL_TABLET | Freq: Every day | ORAL | Status: DC

## 2012-04-17 MED ORDER — BIMATOPROST 0.01 % OP SOLN
1.0000 [drp] | Freq: Every day | OPHTHALMIC | Status: DC
  Administered 2012-04-17 – 2012-04-23 (×7): 1 [drp] via OPHTHALMIC
  Filled 2012-04-17: qty 2.5

## 2012-04-17 MED ORDER — ALBUTEROL SULFATE (5 MG/ML) 0.5% IN NEBU: 2.5000 mg | INHALATION_SOLUTION | Freq: Four times a day (QID) | RESPIRATORY_TRACT | Status: DC | PRN

## 2012-04-17 MED ORDER — FUROSEMIDE 40 MG PO TABS: 40.0000 mg | ORAL_TABLET | Freq: Every day | ORAL | Status: DC

## 2012-04-17 MED ORDER — SACCHAROMYCES BOULARDII 250 MG PO CAPS
250.0000 mg | ORAL_CAPSULE | Freq: Two times a day (BID) | ORAL | Status: DC
  Administered 2012-04-17 – 2012-04-23 (×12): 250 mg via ORAL
  Filled 2012-04-17 (×15): qty 1

## 2012-04-17 MED ORDER — PROCHLORPERAZINE EDISYLATE 5 MG/ML IJ SOLN
5.0000 mg | Freq: Four times a day (QID) | INTRAMUSCULAR | Status: DC | PRN
  Filled 2012-04-17: qty 2

## 2012-04-17 MED ORDER — POTASSIUM CHLORIDE CRYS ER 10 MEQ PO TBCR
10.0000 meq | EXTENDED_RELEASE_TABLET | Freq: Two times a day (BID) | ORAL | Status: DC
  Administered 2012-04-17 – 2012-04-18 (×3): 10 meq via ORAL
  Filled 2012-04-17 (×6): qty 1

## 2012-04-17 MED ORDER — AMIODARONE HCL 200 MG PO TABS: 400.0000 mg | ORAL_TABLET | Freq: Every day | ORAL | Status: DC

## 2012-04-17 MED ORDER — BOOST PLUS PO LIQD: 237.0000 mL | ORAL | Status: DC

## 2012-04-17 MED ORDER — ENOXAPARIN SODIUM 40 MG/0.4ML ~~LOC~~ SOLN
40.0000 mg | SUBCUTANEOUS | Status: DC
  Administered 2012-04-17 – 2012-04-23 (×7): 40 mg via SUBCUTANEOUS
  Filled 2012-04-17 (×8): qty 0.4

## 2012-04-17 MED ORDER — ALUM & MAG HYDROXIDE-SIMETH 200-200-20 MG/5ML PO SUSP: 30.0000 mL | ORAL | Status: DC | PRN

## 2012-04-17 MED ORDER — TRAZODONE HCL 50 MG PO TABS
25.0000 mg | ORAL_TABLET | Freq: Every evening | ORAL | Status: DC | PRN
Start: 2012-04-17 — End: 2012-04-24

## 2012-04-17 MED ORDER — BOOST PLUS PO LIQD
237.0000 mL | ORAL | Status: DC
  Administered 2012-04-18: 237 mL via ORAL
  Filled 2012-04-17 (×3): qty 237

## 2012-04-17 MED ORDER — POLYETHYLENE GLYCOL 3350 17 G PO PACK
17.0000 g | PACK | Freq: Every day | ORAL | Status: DC | PRN
  Filled 2012-04-17: qty 1

## 2012-04-17 MED ORDER — ACETAMINOPHEN 325 MG PO TABS: 325.0000 mg | ORAL_TABLET | ORAL | Status: DC | PRN

## 2012-04-17 MED ORDER — FLUCONAZOLE 100 MG PO TABS
100.0000 mg | ORAL_TABLET | Freq: Every day | ORAL | Status: AC
  Administered 2012-04-18: 100 mg via ORAL
  Filled 2012-04-17 (×2): qty 1

## 2012-04-17 MED ORDER — AMIODARONE HCL 200 MG PO TABS
400.0000 mg | ORAL_TABLET | Freq: Every day | ORAL | Status: DC
  Administered 2012-04-18 – 2012-04-24 (×7): 400 mg via ORAL
  Filled 2012-04-17 (×8): qty 2

## 2012-04-17 MED ORDER — ASPIRIN 81 MG PO TBEC: 81.0000 mg | DELAYED_RELEASE_TABLET | Freq: Every day | ORAL | Status: DC

## 2012-04-17 MED ORDER — LIDOCAINE HCL 2 % EX GEL
CUTANEOUS | Status: DC | PRN
  Filled 2012-04-17: qty 5

## 2012-04-17 MED ORDER — BISACODYL 10 MG RE SUPP
10.0000 mg | Freq: Every day | RECTAL | Status: DC | PRN
  Filled 2012-04-17: qty 1

## 2012-04-17 MED ORDER — FLUCONAZOLE 100 MG PO TABS: 100.0000 mg | ORAL_TABLET | Freq: Every day | ORAL | Status: DC

## 2012-04-17 MED ORDER — FLEET ENEMA 7-19 GM/118ML RE ENEM: 1.0000 | ENEMA | Freq: Once | RECTAL | Status: AC | PRN

## 2012-04-17 MED ORDER — ADULT MULTIVITAMIN W/MINERALS CH: 1.0000 | ORAL_TABLET | Freq: Every day | ORAL | Status: DC

## 2012-04-17 MED ORDER — POTASSIUM CHLORIDE CRYS ER 10 MEQ PO TBCR: 10.0000 meq | EXTENDED_RELEASE_TABLET | Freq: Every day | ORAL | Status: DC

## 2012-04-17 MED ORDER — FUROSEMIDE 40 MG PO TABS
40.0000 mg | ORAL_TABLET | Freq: Every day | ORAL | Status: DC
  Administered 2012-04-18 – 2012-04-21 (×4): 40 mg via ORAL
  Filled 2012-04-17 (×6): qty 1

## 2012-04-17 MED ORDER — ADULT MULTIVITAMIN W/MINERALS CH
1.0000 | ORAL_TABLET | Freq: Every day | ORAL | Status: DC
  Administered 2012-04-18 – 2012-04-24 (×7): 1 via ORAL
  Filled 2012-04-17 (×8): qty 1

## 2012-04-17 MED ORDER — POTASSIUM CHLORIDE CRYS ER 10 MEQ PO TBCR
10.0000 meq | EXTENDED_RELEASE_TABLET | Freq: Every day | ORAL | Status: DC
  Administered 2012-04-17: 10 meq via ORAL
  Filled 2012-04-17: qty 1

## 2012-04-17 MED ORDER — ASPIRIN EC 81 MG PO TBEC
81.0000 mg | DELAYED_RELEASE_TABLET | Freq: Every day | ORAL | Status: DC
  Administered 2012-04-18 – 2012-04-24 (×7): 81 mg via ORAL
  Filled 2012-04-17 (×8): qty 1

## 2012-04-17 MED ORDER — FUROSEMIDE 40 MG PO TABS
40.0000 mg | ORAL_TABLET | Freq: Every day | ORAL | Status: DC
  Administered 2012-04-17: 40 mg via ORAL
  Filled 2012-04-17: qty 1

## 2012-04-17 MED ORDER — PROCHLORPERAZINE MALEATE 5 MG PO TABS
5.0000 mg | ORAL_TABLET | Freq: Four times a day (QID) | ORAL | Status: DC | PRN
  Filled 2012-04-17: qty 2

## 2012-04-17 MED ORDER — GUAIFENESIN-DM 100-10 MG/5ML PO SYRP: 5.0000 mL | ORAL_SOLUTION | Freq: Four times a day (QID) | ORAL | Status: DC | PRN

## 2012-04-17 MED ORDER — HEPARIN SODIUM (PORCINE) 5000 UNIT/ML IJ SOLN: 5000.0000 [IU] | Freq: Three times a day (TID) | INTRAMUSCULAR | Status: DC

## 2012-04-17 MED ORDER — SACCHAROMYCES BOULARDII 250 MG PO CAPS: 250.0000 mg | ORAL_CAPSULE | Freq: Two times a day (BID) | ORAL | Status: DC

## 2012-04-17 NOTE — Discharge Summary (Signed)
Physician Discharge Summary  [name] [id number] DOB: Dec 28, 1945 DOA: 04/03/2012  PCP: [name], MD  Admit date: 04/03/2012 Discharge date: 04/17/2012  Time spent: 35 minutes   Discharge Diagnoses:  Septic shock Complicated Pyelonephritis with obstructive stone  Hypoxemic acute respiratory failure   Acute encephalopathy  Nephrolithiasis  AKI (acute kidney injury) CHF  Discharge Condition: to inpatient rehab, alert and oriented x3  Diet recommendation: heart healthy   Filed Weights   04/15/12 0041 04/15/12 2139 04/16/12 2116  Weight: 112 kg (246 lb 14.6 oz) 104.8 kg (231 lb 0.7 oz) 104.191 kg (229 lb 11.2 oz)    History of present illness:  66 yo woman with hx of kidney stones presented in septic shock to the ED   Hospital Course:  *Septic shock due to E. Coli- Pyelonephritis- Bacteremia - patient was in the ICU with refractory shock on levophed from 10/15 until - 04/06/12  She has been on appropriate antibiotic coverage since 10/15- She received a total of 2 weeks of iv antibiotics    Nephrolithiasis  S/p stenting of Left Ureter with double J stent on 10/16- will need outpt f/u with Urology for removal of stent. Per Urology note, the stent can remain for up to 6 months   AKI (acute kidney injury)/ ATN - peak creatinine 2.7 Now resolving - cont to hold ACE I   NSTEMI  Suspected to be demand ischemia, peak troponin 1.49 on 10/16, no WMA on repeat ECHO   Septic Cardiomyopathy with Ef of [id number] Acute systolic CHF  Cardiology following- repeat ECHO reveals EF of 60-65 % and no WMA Patient did receive  lasix iv prn to maintain euvolemia   A-fib with RVR  Patient was managed on Amiodarone iv with [facility] managing- recommendations are to transition to 200mg  daily at discharge for a total of 30 days-   CHADS 2 score is 1 Cardiology does not feel she warrants long term anticoagulation at this time  Hypoxemia/ Acute respiratory failure    suspected to be due to septic shock- PCCM not convinced that she had an underlying pneumonia. Patient was on ventilator from 10/15 until 10/23  Currently back to room air with excellent O2 sats.  Acute encephalopathy  Due to sepsis and now resolved.  Shock liver  Resolved   Consultants:  Neurology  Urology  Cardiology Procedures:  Lines: 10/15 R IJ (EDP) >> removed  ETT: 10/15>>>10/23 Ureteral stent 10/16    Discharge Exam: Filed Vitals:   04/16/12 1715 04/16/12 2116 04/17/12 0523 04/17/12 1008  BP: 118/62 121/51 130/56 106/51  Pulse: 99 92 91 97  Temp: 98.3 F (36.8 C) 98.7 F (37.1 C) 98.4 F (36.9 C) 97.8 F (36.6 C)  TempSrc:  Oral Oral   Resp: 20 18 20 19   Height:      Weight:  104.191 kg (229 lb 11.2 oz)    SpO2: 94% 94% 92% 97%    General: axox3 Cardiovascular: rrr Respiratory: ctab  Discharge Instructions  Discharge Orders    Future Orders Please Complete By Expires   Diet - low sodium heart healthy      Increase activity slowly          Medication List     As of 04/17/2012 11:29 AM    STOP taking these medications         hydrochlorothiazide 12.5 MG capsule   Commonly known as: MICROZIDE      lisinopril 5 MG tablet   Commonly known as: PRINIVIL,ZESTRIL  TAKE these medications         amiodarone 400 MG tablet   Commonly known as: PACERONE   Take 1 tablet (400 mg total) by mouth daily.      aspirin 81 MG EC tablet   Take 1 tablet (81 mg total) by mouth daily.      bimatoprost 0.01 % Soln   Commonly known as: LUMIGAN   Place 1 drop into both eyes at bedtime.      fluconazole 100 MG tablet   Commonly known as: DIFLUCAN   Take 1 tablet (100 mg total) by mouth daily.      furosemide 40 MG tablet   Commonly known as: LASIX   Take 1 tablet (40 mg total) by mouth daily.      heparin 5000 UNIT/ML injection   Inject 1 mL (5,000 Units total) into the skin every 8 (eight) hours.      lactose free nutrition Liqd   Take 237 mLs by  mouth daily.      multivitamin with minerals Tabs   Take 1 tablet by mouth daily.      potassium chloride 10 MEQ tablet   Commonly known as: K-DUR,KLOR-CON   Take 1 tablet (10 mEq total) by mouth daily.      saccharomyces boulardii 250 MG capsule   Commonly known as: FLORASTOR   Take 1 capsule (250 mg total) by mouth 2 (two) times daily.           Follow-up Information    Follow up with Carolan Clines I, MD. (call for appointment to remove ureteral stent )    Contact information:   [place], [place] [address] [phone]           The results of significant diagnostics from this hospitalization (including imaging, microbiology, ancillary and laboratory) are listed below for reference.    Significant Diagnostic Studies: Ct Abdomen Pelvis Wo Contrast  04/04/2012  *RADIOLOGY REPORT*  Clinical Data: Fever.  Urinary tract infection.  Sepsis.  History of nephrolithiasis.  CT ABDOMEN AND PELVIS WITHOUT CONTRAST  Technique:  Multidetector CT imaging of the abdomen and pelvis was performed following the standard protocol without intravenous contrast.  Comparison: 02/25/2005  Findings: Atelectasis or infiltration in both lung bases.  Large esophageal hiatal hernia.  Multiple intrarenal stones bilaterally, with four stones in the right kidney and two stones in the left kidney.  In the left ureteropelvic junction, there is an additional 7 mm stone.  There is a moderate proximal pyelocaliectasis with periureteral and pararenal stranding suggesting moderate obstruction.  The distal left ureter is decompressed.  No right ureteral stones are demonstrated.  Infection is not excluded but no abscesses are demonstrated.  Surgical absence of the gallbladder.  The unenhanced appearance of the liver, spleen, pancreas, adrenal glands, abdominal aorta, and retroperitoneal lymph nodes is unremarkable.  Small periumbilical hernia containing  fat.  The stomach, small bowel, and colon are decompressed without abnormal distension.  Stool filled colon.  No free air or free fluid in the abdomen.  Pelvis:  Diverticula in the sigmoid colon without diverticulitis. The appendix is normal.  The bladder is decompressed with Foley catheter.  No significant pelvic lymphadenopathy.  Degenerative changes in the lumbar spine.  IMPRESSION: 7 mm obstructing stone in the proximal left ureter at the ureteropelvic junction.  Pararenal and periureteral stranding. Bilateral nonobstructing intrarenal stones.  Large esophageal hiatal hernia with much of the stomach above the diaphragm.  Atelectasis or infiltration in both lung bases.   Original Report Authenticated By: Neale Burly, M.D.    Dg Abd 1 View  04/11/2012  *RADIOLOGY REPORT*  Clinical Data: NG tube placement  ABDOMEN - 1 VIEW  Comparison: None.  Findings: Single fluoroscopic spot image demonstrates an enteric tube terminating in the stomach.  5 ml Omnipaque was injected via tube to firm intraluminal location.  IMPRESSION: Enteric tube terminating in the stomach.   Original Report Authenticated By: Julian Hy, M.D.    Ct Head Wo Contrast  04/11/2012  *RADIOLOGY REPORT*  Clinical Data: Follow-up stroke.  Ventilator support.  CT HEAD WITHOUT CONTRAST  Technique:  Contiguous axial images were obtained from the base of the skull through the vertex without contrast.  Comparison: 04/08/2012  Findings: The brain does not show any evidence of old or acute focal infarction, mass lesion, hemorrhage, hydrocephalus or extra- axial collection.  The calvarium is unremarkable.  No significant sinus disease.  IMPRESSION: Negative head CT.   Original Report Authenticated By: Jules Schick, M.D.    Ct Head Wo Contrast  04/08/2012  *RADIOLOGY REPORT*  Clinical Data: Encephalopathy.  Sepsis.  Rule out CVA  CT HEAD WITHOUT CONTRAST  Technique:  Contiguous axial images were obtained from the base of the skull  through the vertex without contrast.  Comparison: None.  Findings: Age appropriate atrophy.  Negative for acute infarct. Negative for hemorrhage or mass.  No edema in the brain.  No fluid collection is identified.  Calvarium is intact.  Mucosal edema in the ethmoid sinuses.  IMPRESSION: No acute intracranial abnormality.  Mild chronic sinusitis.   Original Report Authenticated By: Truett Perna, M.D.    Dg Retrograde Pyelogram  04/04/2012  *RADIOLOGY REPORT*  Clinical Data: Left ureter stone and placement of stent.  RETROGRADE PYELOGRAM  Comparison: CT 04/04/2012  Findings: Opacification of the left upper renal collecting system demonstrates a stone near the left ureteropelvic junction.  A stent was placed within the left renal pelvis. Stone may have been advanced into the left renal pelvis on the last image.  IMPRESSION: Placement of left ureteral stent.  Stone at the left ureteropelvic junction.   Original Report Authenticated By: Markus Daft, M.D.    US Renal Port  04/07/2012  *RADIOLOGY REPORT*  Clinical Data:   kidney stone with recent obstruction of the left kidney.  Followup left UPJ stone.  Elevated white count.  RENAL/URINARY TRACT ULTRASOUND COMPLETE  Comparison:  Ultrasound 04/04/2012  Findings:  Right Kidney:  10.19 cm in length without obstruction.  8 mm right lower pole stone is unchanged.  Left Kidney:  13.9 cm in length.  Negative for obstruction.  16 mm hypoechoic structure in the mid left kidney is unchanged and  may be a cyst or possibly a renal abscess.  This is unchanged from the prior study.  I would expect a renal abscess to have  changed  in the interval but still  is possible.  Bladder:  Empty bladder with Foley catheter.  IMPRESSION: Negative for hydronephrosis.  15 mm hypoechoic structure in the mid left renal hilum.  This may be a parapelvic cyst or possibly renal abscess.  This is unchanged from the  prior study.  Consider follow-up CT for further evaluation.   Original Report  Authenticated By: Truett Perna, M.D.    US Renal Port  04/04/2012  *RADIOLOGY REPORT*  Clinical Data: Rule out obstruction.  Renal insufficiency  RENAL/URINARY TRACT ULTRASOUND COMPLETE  Comparison:  CT 04/04/2012  Findings:  Right Kidney:  10.8 cm in length.  No hydronephrosis.  8 mm right lower pole stone.  Negative for mass lesion.  Cortical echogenicity is normal.  Left Kidney:  12.2 cm in length.   15 mm rounded hypoechoic structure in the left renal hilum.  No mass lesion is seen in this area on the CT scan.  This could be blood in the collecting system or possibly an occult renal lesion.  No hydronephrosis.  Bladder:  Foley catheter with empty urinary bladder.  IMPRESSION: 8 mm right renal stone without obstruction.  Left-sided hydronephrosis has improved since the prior CT.  15 mm hypoechoic round structure in the left renal hilum of uncertain significance.  This could be blood in the collecting system.   Original Report Authenticated By: Truett Perna, M.D.    Dg Chest Port 1 View  04/12/2012  *RADIOLOGY REPORT*  Clinical Data: Shortness of breath, pleural effusion, follow-up, history hypertension  PORTABLE CHEST - 1 VIEW  Comparison: Portable exam 0520 hours compared to 04/11/2012  Findings: Endotracheal tube and moved. Nasogastric tube tip projects over the distal esophagus. Right jugular line stable. Enlargement of cardiac silhouette with pulmonary vascular congestion and perihilar infiltrates likely pulmonary edema. Small left pleural effusion and basilar atelectasis. No pneumothorax.  IMPRESSION: Probable pulmonary edema. Nasogastric tube has been partially withdrawn with tip now projecting over the distal esophagus, recommend advancing tube 7 cm.   Original Report Authenticated By: Burnetta Sabin, M.D.    Dg Chest Port 1 View  04/11/2012  *RADIOLOGY REPORT*  Clinical Data: Endotracheal tube  PORTABLE CHEST - 1 VIEW  Comparison: 04/10/2012  Findings: Endotracheal tube is 1.5 cm above the  carina.  NG is in the stomach.  Right jugular catheter in the SVC.  Bibasilar  airspace disease is unchanged.  Left effusion is unchanged.  IMPRESSION: No significant change bibasilar atelectasis and left effusion.   Original Report Authenticated By: Truett Perna, M.D.    Dg Chest Port 1 View  04/10/2012  *RADIOLOGY REPORT*  Clinical Data: Evaluate endotracheal tube position.  Monitor pleural effusion and atelectasis versus consolidation.  PORTABLE CHEST - 1 VIEW  Comparison: 04/09/2012  Findings: Endotracheal tube terminates 2.8 cm above carina. Nasogastric tube extends beyond the  inferior aspect of the film. Right IJ central line tip at right brachycephalic vein.  Cardiomegaly accentuated by AP portable technique.  Similar small left pleural effusion. No pneumothorax.  Low lung volumes with superimposed mild interstitial edema.  Minimally improved. Improved right base aeration with persistent left greater than right bibasilar airspace disease.  IMPRESSION:  1.  Slightly improved aeration, with decreased congestive heart failure and right base air space disease. 2.  Small left pleural effusion and left base atelectasis or infection remaining. 3.  Endotracheal tube appropriately position.   Original Report Authenticated By: Areta Haber, M.D.    Dg Chest Port 1 View  04/09/2012  *RADIOLOGY REPORT*  Clinical Data: Evaluate endotracheal tube.  PORTABLE CHEST - 1 VIEW  Comparison: Chest x-ray 04/08/2012.  Findings: An endotracheal tube is in place with tip 2.1 cm above the carina. A nasogastric tube is seen extending into the stomach, however, the tip of the nasogastric tube extends below the lower margin of the image.  Lung volumes are low.  Bibasilar opacities (left greater than right) may reflect areas of atelectasis and/or consolidation, with superimposed bilateral pleural effusions (small right and moderate on the left).  Crowding of the pulmonary vasculature,  accentuated by low lung volumes, without  frank pulmonary edema.  Heart size is mildly enlarged. The patient is rotated to the left on today's exam, resulting in distortion of the mediastinal contours and reduced diagnostic sensitivity and specificity for mediastinal pathology.  IMPRESSION: 1.  Support apparatus, as above. 2.  Decreasing lung volumes with worsening bibasilar aeration which likely reflects a combination of atelectasis and/or consolidation with superimposed bilateral pleural effusions (small on the right and moderate on the left). 3.  Mild cardiomegaly is unchanged.   Original Report Authenticated By: Etheleen Mayhew, M.D.    Dg Chest Port 1 View  04/08/2012  *RADIOLOGY REPORT*  Clinical Data: Follow up edema  PORTABLE CHEST - 1 VIEW  Comparison: 04/07/2012  Findings: ET tube tip is above the carina.  There is a right IJ catheter with tip in the SVC.  Nasogastric tube tip is in the stomach.  Interval decrease in heart size.  There has been improvement in interstitial edema and right pleural effusion. Persistent decreased aeration to the left base.  IMPRESSION:  1.  Improvement in pulmonary edema pattern.   Original Report Authenticated By: Angelita Ingles, M.D.    Dg Chest Port 1 View  04/07/2012  *RADIOLOGY REPORT*  Clinical Data: Ventilated patient  PORTABLE CHEST - 1 VIEW  Comparison: 04/06/2012; 04/05/2012  Findings:  Grossly unchanged cardiac silhouette and mediastinal contours given patient rotation.  Stable positioning of support apparatus.  No definite pneumothorax.  Pulmonary vasculature is less distinct on the present examination.  Likely worsening bilateral mid and lower lung heterogeneous opacities, left greater than right.  Small bilateral effusions are suspected.  Unchanged bones.  IMPRESSION: 1.  Stable positioning of support apparatus.  No pneumothorax. 2.  Mild pulmonary edema. 3.  Worsening bilateral mid and lower lung opacities, while possibly representative of alveolar pulmonary edema or atelectasis, underlying  infection is not excluded.   Original Report Authenticated By: Rachel Moulds, M.D.    Dg Chest Port 1 View  04/06/2012  *RADIOLOGY REPORT*  Clinical Data: Low oxygen saturation.  PORTABLE CHEST - 1 VIEW  Comparison: 04/05/2012  Findings: Shallow inspiration.  Cardiac enlargement.  Pulmonary vascularity appears normal for technique.  There is increasing infiltration or atelectasis in the left mid and lower lung. Atelectasis or infiltration also demonstrated in the right lung base without change.  Endotracheal tube tip measures about 8 mm from the carina.  Right central venous catheter is located over the mid SVC region.  Probable small bilateral pleural effusions.  IMPRESSION: Increasing atelectasis or infiltration in the left mid and lower lungs.  Persistent small bilateral basilar infiltrates/atelectasis and effusions.  Endotracheal tube tip measures only about 8 mm from the carina.   Original Report Authenticated By: Neale Burly, M.D.    Portable Chest Xray In Am  04/05/2012  *RADIOLOGY REPORT*  Clinical Data: Septic shock.  PORTABLE CHEST - 1 VIEW  Comparison: Portable chest 04/04/2012 and 04/03/2012.  Findings: The patient has a new NG tube which courses into the stomach and below the inferior margin of the film.  Endotracheal tube and right IJ catheter appear unchanged.  Small bilateral pleural effusions and bilateral airspace disease, both worse on the left, persist without notable change.  There is cardiomegaly.  No pneumothorax.  IMPRESSION: No change in small bilateral pleural effusions and left greater than right airspace disease.   Original Report Authenticated By: Arvid Right. Luther Parody, M.D.    Portable Chest Xray  04/04/2012  *RADIOLOGY REPORT*  Clinical Data: Check ET tube position.  PORTABLE CHEST - 1 VIEW  Comparison: 04/04/2012 at 0352 hours  Findings: Endotracheal tube has been placed with tip about 3.1 cm above the carina.  Right central venous catheter remains with tip over the  mid SVC region.  Shallow inspiration.  Cardiac enlargement with some pulmonary vascular congestion.  No focal airspace consolidation in the lungs.  No pneumothorax.  IMPRESSION: Endotracheal tube tip is about 3.1 cm above the carina.   Original Report Authenticated By: Neale Burly, M.D.    Dg Chest Port 1 View  04/04/2012  *RADIOLOGY REPORT*  Clinical Data: Respiratory failure.  PORTABLE CHEST - 1 VIEW  Comparison: 04/03/2012  Findings: Shallow inspiration.  Mild cardiac enlargement with normal pulmonary vascularity.  Right central venous catheter over the mid SVC region.  No blunting of costophrenic angles.  No pneumothorax. Improved vascular congestion since previous study.  IMPRESSION: Improved vascular congestion since previous study.  No developing consolidation.  Shallow inspiration.   Original Report Authenticated By: Neale Burly, M.D.    Dg Chest Portable 1 View  04/04/2012  *RADIOLOGY REPORT*  Clinical Data: Shortness of breath and line placement.  PORTABLE CHEST - 1 VIEW  Comparison: 04/03/2012 at 2220 hours  Findings: 2354 hours.  Right IJ central line terminates at low SVC, without pneumothorax. Cardiomegaly accentuated by AP portable technique.  No right and no definite left pleural fluid.  Low lung volumes with mild pulmonary venous congestion.  Probable left base atelectasis.  IMPRESSION: Right IJ central line terminating at mid to low SVC, without pneumothorax.  Mild pulmonary venous congestion, superimposed upon diminished lung volumes.   Original Report Authenticated By: Areta Haber, M.D.    Dg Chest Portable 1 View  04/03/2012  *RADIOLOGY REPORT*  Clinical Data: Shortness of breath.  PORTABLE CHEST - 1 VIEW  Comparison: 03/23/2005  Findings: Numerous leads and wires project over the chest.  Patient rotated left.  Mild cardiomegaly.  Cannot exclude small left pleural effusion; soft tissues project over the left costophrenic angle on the frontal. No pneumothorax.  Low  lung volumes.  Possible concurrent mild pulmonary venous congestion.  Mild volume loss at the left lung base.  Moderate to large hiatal hernia.  IMPRESSION:  1.  Cardiomegaly and low lung volumes.  Cannot exclude mild pulmonary venous congestion. 2.  Moderate-to-large hiatal hernia. 3.  Degraded AP portable view secondary overlying soft tissues at the left lung base.  Consider PA and lateral radiographs, if possible.   Original Report Authenticated By: Areta Haber, M.D.    Dg Naso G Tube Plc W/fl-no Rad  04/12/2012  CLINICAL DATA: Dysphagia and altered mental status, needs for enteral  nutrition and medications, unable to pass NG tube at bedside   NASO G TUBE PLACEMENT WITH FLUORO  Fluoroscopy was utilized by the requesting physician.  No radiographic  interpretation.      Microbiology: Recent Results (from the past 240 hour(s))  CLOSTRIDIUM DIFFICILE BY PCR     Status: Normal   Collection Time   04/09/12 11:39 AM      Component Value Range Status Comment   C difficile by pcr NEGATIVE  NEGATIVE Final      Labs: Basic Metabolic Panel:  Lab [id number] 0640 04/16/12 0648 04/15/12 0942 04/13/12 0327 04/12/12 0350  NA 143 143 144 148* 148*  K 3.3* 3.6 3.3* 3.8 3.7  CL 106 110 110 117* 117*  CO2 27 24 24 20 22   GLUCOSE 106* 94  131* 98 113*  BUN 24* 26* 29* 42* 51*  CREATININE 1.60* 1.49* 1.45* 1.43* 1.66*  CALCIUM 8.4 8.4 8.7 8.7 8.3*  MG -- -- -- -- --  PHOS -- -- -- -- --   Liver Function Tests:  Lab 04/11/12 0420  AST 37  ALT 57*  ALKPHOS 195*  BILITOT 0.2*  PROT 5.2*  ALBUMIN 1.8*   No results found for this basename: LIPASE:5,AMYLASE:5 in the last 168 hours No results found for this basename: AMMONIA:5 in the last 168 hours CBC:  Lab 04/17/12 0640 04/16/12 0648 04/15/12 0942 04/13/12 0327 04/12/12 0350  WBC 8.3 8.5 10.0 14.3* 17.9*  NEUTROABS -- -- -- -- --  HGB 9.9* 9.7* 10.7* 10.6* 9.5*  HCT 30.3* 29.9* 33.1* 33.1* 28.8*  MCV 86.3 86.2 86.2 88.0 86.5  PLT 215 197  190 146* 135*   Cardiac Enzymes: No results found for this basename: CKTOTAL:5,CKMB:5,CKMBINDEX:5,TROPONINI:5 in the last 168 hours BNP: BNP (last 3 results)  Basename 04/04/12 0210  PROBNP 8289.0*   CBG:  Lab 04/12/12 1613 04/12/12 1214 04/12/12 0754 04/12/12 0441 04/11/12 2346  GLUCAP 77 110* 97 113* 106*       Signed:  Jaiyden Laur  [facility] 04/17/2012, 11:29 AM

## 2012-04-17 NOTE — H&P (View-Only) (Signed)
Physical Medicine and Rehabilitation Admission H&P    Chief Complaint  Patient presents with  . Sepsis with encephalopathy.   : HPI: [name] is a 66 y.o. female with history of HTN, nephrolithiasis; who was admitted on 10/15 with progressive pain with fever, chills, dysuria and confusion. She was noted to be hypoxic and in uroseptic shock due to & mm obstructing stone in left ureter. She developed respiratory failure requiring intubation. She was taken to OR on 10/16 for L-JJ stent placement by Dr. Gaynelle Arabian. BC/UC positive for E.Coli. Was treated with sepsis protocol and had prolonged intubation. Cardiac echo done revealing diffuse hypokinesis with EF 20-25% likely due to septic CM. She developed A-fib and converted to NSR on amiodarone. NSTEMI likely due to demand ischemia. She has been confused with agitation and was treated with propofol and MIND trial for delirium. Neurology consulted for input and felt that patient with acute metabolic encephalopathy. Lactic acid and ammonia levels normal. EEG with generalized background slowing.   Repeat cardiac echo done showing improvement in EF to 60-65%. Dr. Percival Spanish consulted for input and recommended continuing amiodarone 400 mg bid with transition to 200 mg daily at discharge. No indications for coumadin at this time. Issues with acute renal failure, fluid overload, leucocytosis and thrombocytopenia resolving. She was extubated on 10/23 and NPO due to concerns of dysphagia. FEES done  and patient started on regular diet. PT/OT evaluations done and patient noted to be deconditioned.confusion and paranoia is resolving.  Mentation is improving but patient continues with slow movements. Therapy team recommending CIR.   Review of Systems  HENT: Negative for hearing loss.   Eyes: Negative for blurred vision, double vision and photophobia.  Respiratory: Negative for cough, shortness of breath and wheezing.   Cardiovascular: Positive for leg swelling.  Negative for chest pain and palpitations.  Gastrointestinal: Positive for diarrhea. Negative for abdominal pain.  Neurological: Positive for tingling (toes). Negative for dizziness and headaches.  Psychiatric/Behavioral: Negative for hallucinations. The patient is not nervous/anxious.    Past Medical History  Diagnosis Date  . Hypertension   . Pneumonia   . GERD (gastroesophageal reflux disease)   . Headache    Past Surgical History  Procedure Date  . Abdominal hysterectomy   . Cholecystectomy   . Cystoscopy w/ ureteral stent placement 04/04/2012    Procedure: CYSTOSCOPY WITH RETROGRADE PYELOGRAM/URETERAL STENT PLACEMENT;  Surgeon: Ailene Rud, MD;  Location: [facility];  Service: Urology;  Laterality: Left;   History reviewed. No pertinent family history.  Social History:  Daughter live with her.  Was laid off and just started part time job. She reports that she has never smoked. She does not have any smokeless tobacco history on file. She reports that she does not drink alcohol or use illicit drugs.  Allergies: No Known Allergies  Medications Prior to Admission  Medication Sig Dispense Refill  . bimatoprost (LUMIGAN) 0.01 % SOLN Place 1 drop into both eyes at bedtime.      Marland Kitchen DISCONTD: hydrochlorothiazide (MICROZIDE) 12.5 MG capsule Take 12.5 mg by mouth daily.      Marland Kitchen DISCONTD: lisinopril (PRINIVIL,ZESTRIL) 5 MG tablet Take 5 mg by mouth daily.        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 Status:  Mobility: Bed Mobility Bed Mobility:  (pt presented to PT sittign in chair) Supine to Sit: 3: Mod assist;With rails Supine to Sit: Patient Percentage: 50% Sitting - Scoot to [employer] of Bed: 4: Min assist Sitting - Scoot to [employer] of Bed: Patient Percentage: 50% Sit to Supine: 4: Min assist;HOB flat Transfers Transfers: Sit to Stand;Stand to Sit Sit to Stand: 3: Mod assist;From chair/3-in-1;With upper extremity assist Sit to Stand: Patient Percentage: 60% Stand to Sit: 3: Mod assist;With upper extremity assist;To chair/3-in-1 Stand to Sit: Patient Percentage: 50% Stand Pivot Transfers: 1: +2 Total assist Stand Pivot Transfers: Patient Percentage: 50% Ambulation/Gait Ambulation/Gait Assistance: 1: +2 Total assist Ambulation/Gait: Patient Percentage: 70% Ambulation Distance (Feet): 80 Feet Assistive device: Rolling walker Ambulation/Gait Assistance Details: Second person for safety eith amb as this was the first time pt has walked in quite some time; Following cues for safety and posture well; Very slow, inefficient gait with short step and stride lengths; 3 stnading rest breaks; Physical assist for RW management around obstacles Gait Pattern: Decreased stride length;Decreased step length - right;Decreased step length - left Gait velocity: quite slow Stairs: No Wheelchair Mobility Wheelchair Mobility: No  ADL: ADL Eating/Feeding: +1 Total assistance (Pt unable to manipulate utensils) Where Assessed - Eating/Feeding: Chair Grooming: Performed;Wash/dry face;Supervision/safety Where Assessed - Grooming: Unsupported sitting Upper Body Bathing: +1 Total assistance Where Assessed - Upper Body Bathing: Supported sitting Lower Body Bathing: +1 Total assistance Where Assessed - Lower Body Bathing: Supported sit to stand Upper Body  Dressing: +1 Total assistance Where Assessed - Upper Body Dressing: Supported sitting Lower Body Dressing: +1 Total assistance Where Assessed - Lower Body Dressing: Supported sit to Lobbyist: +2 Total assistance Toilet Transfer Method: Stand pivot;Sit to Loss adjuster, chartered: Other (comment) Building services engineer) Tub/Shower Transfer:  (Not appropriate) Equipment Used: Gait belt Transfers/Ambulation Related to ADLs: Not assessed this session ADL Comments: Focus of sesson on increasing activity tolerance with sitting tasks EOB.  Pt washed face using bil UE with min verbal cueing to stay on task (pt very talkative).  Pt performed pushups from bed x5 with min assist to increase bil UE strength in prep for functional transfers.  Pt also performed bil UE ROM exercises but would benefit from  theraband next session. Bil UE strength 3+/5 throughout.  Cognition: Cognition Arousal/Alertness: Awake/alert Orientation Level: Oriented X4 Cognition Overall Cognitive Status: Appears within functional limits for tasks assessed/performed Area of Impairment: Attention;Following commands Arousal/Alertness: Awake/alert Orientation Level: Appears intact for tasks assessed Behavior During Session: [facility] for tasks performed Current Attention Level: Sustained Attention - Other Comments: 10 seconds Following Commands: Follows one step commands consistently Safety/Judgement: Decreased safety judgement for tasks assessed;Impulsive Awareness of Errors: Assistance required to identify errors made;Assistance required to correct errors made Problem Solving: mod A Cognition - Other Comments: pt focused on lunch and eating.     Blood pressure 106/51, pulse 97, temperature 97.8 F (36.6 C), temperature source Oral, resp. rate 19, height 5\' 4"  (1.626 m), weight 104.191 kg (229 lb 11.2 oz), SpO2 97.00%. Physical Exam  Nursing note and vitals reviewed. Constitutional: She is oriented to person, place, and  time. She appears well-developed and well-nourished.  Obese female  HENT:  Head: Normocephalic and atraumatic.  Mouth/Throat: Oropharynx is clear and moist.  Eyes: Conjunctivae normal and EOM are normal. Pupils are equal, round, and reactive to light.  Neck: Normal range of motion. Neck supple. No JVD present. No tracheal deviation present. No thyromegaly present.  Cardiovascular: Normal rate, regular rhythm and normal heart sounds.  Exam reveals no gallop and no friction rub.   No murmur heard. Pulmonary/Chest: Effort normal and breath sounds normal. No respiratory distress. She has no wheezes.  Abdominal: Soft. Bowel sounds are normal. She exhibits no distension. There is no tenderness. There is no rebound.  Musculoskeletal: She exhibits edema.       2+ pedal edema.  Minimal pretibial edema noted.   Neurological: She is alert and oriented to person, place, and time.       Follows commands without difficulty.  Paranoia and confusion has resolved.  Speech clear.  Strength grossly 4/5 UE and 2-3/5 prox legs to 4/5 distally. No sensory deficits.   Skin: Skin is warm.       MASD under bilateral breast with linear skin tears.  Toes on bilateral feet with evidence of ischemia--reddened with crusted drainage. Macerated/wet areas between toes--?candidal component. R-2nd toe with black eschar on tip. Right lateral forefoot with streaky erythema. Multiple toes with signs of ischemia to varying degrees. Feet are sensitive to touch.  Psychiatric: She has a normal mood and affect. Her speech is normal and behavior is normal. Judgment and thought content normal. Her mood appears not anxious. Thought content is not paranoid and not delusional. Cognition and memory are normal.    BASIC METABOLIC PANEL     Status: Abnormal   Collection Time   04/17/12  6:40 AM      Component Value Range Comment   Sodium 143  135 - 145 mEq/L    Potassium 3.3 (*) 3.5 - 5.1 mEq/L    Chloride 106  96 - 112 mEq/L    CO2  27  19 - 32 mEq/L    Glucose, Bld 106 (*) 70 - 99 mg/dL    BUN 24 (*) 6 - 23 mg/dL    Creatinine, Ser 1.60 (*) 0.50 - 1.10 mg/dL    Calcium 8.4  8.4 - 10.5 mg/dL    GFR calc non Af Amer 32 (*) >90 mL/min    GFR calc Af Amer 37 (*) >90 mL/min   CBC     Status: Abnormal   Collection Time   04/17/12  6:40 AM      Component Value Range Comment   WBC 8.3  4.0 - 10.5 K/uL    RBC 3.51 (*) 3.87 - 5.11 MIL/uL    Hemoglobin 9.9 (*) 12.0 - 15.0 g/dL    HCT 30.3 (*) 36.0 - 46.0 %    MCV 86.3  78.0 - 100.0 fL    MCH 28.2  26.0 - 34.0 pg    MCHC 32.7  30.0 - 36.0 g/dL    RDW 15.8 (*) 11.5 - 15.5 %    Platelets 215  150 - 400 K/uL    No results found.  Post Admission Physician Evaluation: 1. Functional deficits secondary  to severe deconditioning after septic shock with resolving metabolic encephalopathy. 2. Patient is admitted to receive collaborative, interdisciplinary care between the physiatrist, rehab nursing staff, and therapy team. 3. Patient's level of medical complexity and substantial therapy needs in context of that medical necessity cannot be provided at a lesser intensity of care such  as a SNF. 4. Patient has experienced substantial functional loss from his/her baseline which was documented above under the "Functional History" and "Functional Status" headings.  Judging by the patient's diagnosis, physical exam, and functional history, the patient has potential for functional progress which will result in measurable gains while on inpatient rehab.  These gains will be of substantial and practical use upon discharge  in facilitating mobility and self-care at the household level. 5. Physiatrist will provide 24 hour management of medical needs as well as oversight of the therapy plan/treatment and provide guidance as appropriate regarding the interaction of the two. 6. 24 hour rehab nursing will assist with bladder management, bowel management, safety, skin/wound care, disease management,  medication administration, pain management and patient education  and help integrate therapy concepts, techniques,education, etc. 7. PT will assess and treat for:  fxnl mobility, strength, stamina, pain mgt, safety, adaptive techniques and equipment.  Goals are: supervision to min assist. 8. OT will assess and treat for: ues, rom, safety , ADL's, stamina, balance, adaptive equipment.   Goals are: supervision to mod assist. 9. SLP will assess and treat for: n/a.  Goals are: n/a. 10. Case Management and Social Worker will assess and treat for psychological issues and discharge planning. 11. Team conference will be held weekly to assess progress toward goals and to determine barriers to discharge. 12. Patient will receive at least 3 hours of therapy per day at least 5 days per week. 13. ELOS: 2-3 weeks      Prognosis:  excellent   Medical Problem List and Plan: 1. DVT Prophylaxis/Anticoagulation: Pharmaceutical: Lovenox 2. Pain Management:  Complains of toes hurting.  Support shoes recommended for protection of ischemic toes.  Will treat with prn tylenol for now.  3. Mood: motivated. Seems to be dealing with current situation well. LCSW  To follow up for formal evaluation.  4. Neuropsych: This patient is capable of making decisions on his/her own behalf. 5. Fluid overload:  Resolved. Will continue to monitor weight every other day. Continue low salt diet.  Continue lasix daily.  6. Atrial fibrillation: monitor HR with bid checks. Continue amiodarone.  7. Diarrhea:  On probiotics.  8. NSTEMI:   Due to demand ischemia. No complaints of CP/dyspnea 9. Ischemic toes:  With ?interdigital candidal infection. Cellulitis v/s ischemia right lateral forefoot. ?VVS input for monitoring.   10. Hypokalemia: due to diuretics.  Increase supplement.  Recheck in am.  11. Glaucoma: continue lumigan bid.  12. Candidal infection: difucan D#2  04/17/2012, Oval Linsey, MD

## 2012-04-17 NOTE — PMR Pre-admission (Signed)
PMR Admission Coordinator Pre-Admission Assessment  Patient: [name] is an 66 y.o., female MRN: [id number] DOB: [birth year] Height: 5\' 4"  (162.6 cm) Weight: 104.191 kg (229 lb 11.2 oz)  Insurance Information HMO: yes PRIMARY: Blue Medicare      Policy#: [id number]      Subscriber: self` CM Name: Limmie Patricia      Phone#: [phone]     Fax#: [phone] Pre-Cert#: [id number]      Employer: [employer] Benefits:  Phone #: [phone]     Name: Gus Rankin. Date: 08/19/11     Deduct: 0      Out of Pocket Max: $3400 ($50.81 met)      Life Max: none CIR: Day (1-6 $195) (Day 7+ $0)      SNF: 100 days max (0 days 1-10) ($50 days 11-100) Outpatient:  No limits   Co-Pay: $35 Home Health: No limits      Co-Pay: none DME:      Co-Pay: 20% Providers:in network  Emergency Contact Information Contact Information    Name Relation Home Work [place] Daughter [phone]  [phone]     Current Medical History  Patient Admitting Diagnosis:Metabolic encephalopathy and deconditioning do to uroseptic shock History of Present Illness:66 y.o. female with history of HTN, nephrolithiasis; who was admitted on 10/15 with progressive pain with fever, chills, dysuria and confusion. She was noted to be hypoxic and in uroseptic shock due to & mm obstructing stone in left ureter. She developed respiratory failure requiring intubation. She was taken to OR on 10/16 for L-JJ stent placement by Dr. Gaynelle Arabian. BC/UC positive for E.Coli. Was treated with sepsis protocol and had prolonged intubation. Cardiac echo done revealing diffuse hypokinesis with EF 20-25% likely due to septic CM. She developed A-fib and converted to NSR on amiodarone. NSTEMI likely due to demand ischemia. She has been confused with agitation and was treated with propofol and MIND trial for delirium. Neurology consulted for input and felt that patient with acute metabolic encephalopathy. Lactic acid and ammonia levels normal.  EEG with generalized background slowing.  Repeat cardiac echo done showing improvement in EF to 60-65%. Dr. Percival Spanish consulted for input and recommended continuing amiodarone 400 mg bid with transition to 200 mg daily at discharge. No indications for coumadin at this time. Issues with acute renal failure, fluid overload, leucocytosis and thrombocytopenia resolving. She was extubated on 10/23 and NPO due to concerns of dysphagia. FEES done today and patient started on regular diet. PT/OT evaluations done today and patient noted to be deconditioned and continues to have problems processing.     Past Medical History  Past Medical History  Diagnosis Date  . Hypertension   . Pneumonia   . GERD (gastroesophageal reflux disease)   . Headache     Family History  family history is not on file.  Prior Rehab/Hospitalizations: none  Current Medications  Current facility-administered medications:albuterol (PROVENTIL) (5 MG/ML) 0.5% nebulizer solution 2.5 mg, 2.5 mg, Nebulization, Q6H PRN, Debbe Odea, MD;  amiodarone (PACERONE) tablet 400 mg, 400 mg, Oral, BID, Debbe Odea, MD, 400 mg at 04/17/12 0943;  aspirin EC tablet 81 mg, 81 mg, Oral, Daily, Montez Morita, MD, 81 mg at 04/17/12 0943 bimatoprost (LUMIGAN) 0.01 % ophthalmic solution 1 drop, 1 drop, Both Eyes, QHS, Saima Rizwan, MD, 1 drop at 04/16/12 2357;  ciprofloxacin (CIPRO) tablet 500 mg, 500 mg, Oral, BID, Debbe Odea, MD, 500 mg at 04/17/12 0943;  fluconazole (DIFLUCAN) tablet 100 mg, 100 mg, Oral, Daily,  Sorin June Leap, MD, 100 mg at 04/17/12 0943;  furosemide (LASIX) injection 40 mg, 40 mg, Intravenous, Once, Sorin C Laza, MD, 40 mg at 04/16/12 1054 heparin injection 5,000 Units, 5,000 Units, Subcutaneous, Q8H, Montez Morita, MD, 5,000 Units at 04/17/12 [phone];  lactose free nutrition (BOOST PLUS) liquid 237 mL, 237 mL, Oral, Q24H, Erlene Quan, RD, 237 mL at 04/16/12 1419;  multivitamin with minerals tablet 1 tablet, 1 tablet, Oral,  Daily, Debbe Odea, MD, 1 tablet at 04/17/12 [id number] saccharomyces boulardii (FLORASTOR) capsule 250 mg, 250 mg, Oral, BID, Debbe Odea, MD, 250 mg at 04/17/12 0943;  zolpidem (AMBIEN) tablet 5 mg, 5 mg, Oral, QHS PRN, Ritta Slot, NP, 5 mg at 04/14/12 2103  Patients Current Diet: Cardiac  Precautions / Restrictions Precautions Precautions: Fall Restrictions Weight Bearing Restrictions: No   Prior Activity Level Community (5-7x/wk): Active daily Home Assistive Devices / Equipment Home Assistive Devices/Equipment: None Home Adaptive Equipment: Shower chair with back;Grab bars in shower;Walker - rolling  Prior Functional Level Prior Function Level of Independence: Independent Able to Take Stairs?: Yes Driving: Yes Vocation: Full time employment 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  Arousal/Alertness: Awake/alert Overall Cognitive Status: Impaired Current Attention Level: Sustained Attention - Other Comments: 10 seconds Orientation Level: Oriented to person;Oriented to place;Oriented to time Following Commands: Follows one step commands inconsistently;Follows multi-step commands inconsistently;Follows multi-step commands with increased time Safety/Judgement: Decreased safety judgement for tasks assessed;Impulsive Awareness of Errors: Assistance required to identify errors made;Assistance required to correct errors made Cognition - Other Comments: pt focused on lunch and eating.      Extremity Assessment (includes Sensation/Coordination)  RUE ROM/Strength/Tone: Deficits RUE ROM/Strength/Tone Deficits: shoulder 2/5; elbow  and hand 3-/5 RUE Coordination: Deficits RUE Coordination Deficits: unable to manipulate feeding utensils  RLE ROM/Strength/Tone: Deficits RLE ROM/Strength/Tone Deficits: Grossly weak and debilitated, 4-/5 RLE Sensation: WFL - Light Touch    ADLs  Eating/Feeding: +1 Total assistance (Pt unable to  manipulate utensils) Where Assessed - Eating/Feeding: Chair Grooming: Performed;Wash/dry face;Supervision/safety Where Assessed - Grooming: Unsupported sitting Upper Body Bathing: +1 Total assistance Where Assessed - Upper Body Bathing: Supported sitting Lower Body Bathing: +1 Total assistance Where Assessed - Lower Body Bathing: Supported sit to stand Upper Body Dressing: +1 Total assistance Where Assessed - Upper Body Dressing: Supported sitting Lower Body Dressing: +1 Total assistance Where Assessed - Lower Body Dressing: Supported sit to Lobbyist: +2 Total assistance Toilet Transfer: Patient Percentage: 50% Toilet Transfer Method: Stand pivot;Sit to Loss adjuster, chartered: Other (comment) Building services engineer) Toileting - Clothing Manipulation and Hygiene: +1 Total assistance Where Assessed - Best boy and Hygiene: Standing Tub/Shower Transfer:  (Not appropriate) Equipment Used: Gait belt Transfers/Ambulation Related to ADLs: Not assessed this session ADL Comments: Focus of sesson on increasing activity tolerance with sitting tasks EOB.  Pt washed face using bil UE with min verbal cueing to stay on task (pt very talkative).  Pt performed pushups from bed x5 with min assist to increase bil UE strength in prep for functional transfers.  Pt also performed bil UE ROM exercises but would benefit from  theraband next session. Bil UE strength 3+/5 throughout.    Mobility  Bed Mobility: Sitting - Scoot to Edge of Bed;Supine to Sit;Sit to Supine Supine to Sit: 3: Mod assist;With rails Supine to Sit: Patient Percentage: 50% Sitting - Scoot to Edge of Bed: 4: Min assist Sitting - Scoot to [employer] of Bed: Patient Percentage:  50% Sit to Supine: 4: Min assist;HOB flat    Transfers  Transfers: Sit to Stand;Stand to Sit;Stand Pivot Transfers Sit to Stand: 1: +2 Total assist;From bed;With upper extremity assist Sit to Stand: Patient Percentage: 60% Stand to Sit: 1:  +2 Total assist;To chair/3-in-1;With upper extremity assist Stand to Sit: Patient Percentage: 50% Stand Pivot Transfers: 1: +2 Total assist Stand Pivot Transfers: Patient Percentage: 50%    Ambulation / Gait / Stairs / Wheelchair Mobility  Ambulation/Gait Ambulation/Gait Assistance: Not tested (comment) Stairs: No Wheelchair Mobility Wheelchair Mobility: No    Posture / Chiropodist Sitting - Balance Support: No upper extremity supported;Feet supported Static Sitting - Level of Assistance: 5: Stand by assistance Static Sitting - Comment/# of Minutes: Pt sat EOB 15 min unsupported     Previous Home Environment Living Arrangements: Children Lives With: Daughter Available Help at Discharge: Family;Available 24 hours/day (dtr not available to confirm) Type of Home: House Home Layout: One level Home Access: Stairs to enter Entrance Stairs-Rails: Left;Right;Can reach both Entrance Stairs-Number of Steps: 2 Bathroom Shower/Tub: Multimedia programmer: Handicapped height Home Care Services: No Additional Comments: Dtr not present to cofirm info  Discharge Living Setting Plans for Discharge Living Setting: Lives with (comment) (Daughter) Type of Home at Discharge: House Discharge Home Layout: One level Discharge Home Access: Stairs to enter Entrance Stairs-Rails: Can reach both Entrance Stairs-Number of Steps: 2 Do you have any problems obtaining your medications?: No  Social/Family/Support Systems Patient Roles: Parent;Other (Comment) (works full time) Sport and exercise psychologist Information: [phone] / [phone] Anticipated Caregiver: Daughter Anticipated Caregiver's Contact Information: Bryson Grafe: [phone] Ability/Limitations of Caregiver: None Caregiver Availability: 24/7 Discharge Plan Discussed with Primary Caregiver: Yes Is Caregiver In Agreement with Plan?: Yes Does Caregiver/Family have Issues with Lodging/Transportation while Pt is in Rehab?:  No  Goals/Additional Needs Patient/Family Goal for Rehab: PT:min A, [id number], ST: S Expected length of stay: 10-14 days Cultural Considerations: none Dietary Needs: Heart healthy Equipment Needs: TBD Pt/Family Agrees to Admission and willing to participate: Yes Program Orientation Provided & Reviewed with Pt/Caregiver Including Roles  & Responsibilities: Yes  Patient Condition: Please see physician update to information in consult dated 04/13/12.  Preadmission Screen Completed By:  Flo Shanks, 04/17/2012 10:08 AM ______________________________________________________________________   Discussed status with Dr. Naaman Plummer on 04/17/12 at 11:52 am and received telephone approval for admission today.  Admission Coordinator:  Flo Shanks, time 11:55 am/Date10/29/13

## 2012-04-17 NOTE — Progress Notes (Signed)
Pt being transferred to 4000. Report was given to new nurse all of patients questions/concerns have been addressed. Assessment unchanged from morning, IV removed, tele removed.

## 2012-04-17 NOTE — Progress Notes (Signed)
Physical Therapy Treatment Patient Details Name: [name] MRN: [id number] DOB: Jan 19, 1946 Today's Date: 04/17/2012 Time: [id number] PT Time Calculation (min): 26 min  PT Assessment / Plan / Recommendation Comments on Treatment Session  Admitted with pyelonephritis, septic shock, VDRF; Much improved cognition since eval, and with improving functional mobility, though still quite inefficient gait; Hard worker; Is an excellent candidate for Comprehensive Inpt Rehab    Follow Up Recommendations  Post acute inpatient     Does the patient have the potential to tolerate intense rehabilitation     Barriers to Discharge        Equipment Recommendations  3 in 1 bedside comode    Recommendations for Other Services    Frequency Min 3X/week   Plan Discharge plan remains appropriate    Precautions / Restrictions Precautions Precautions: Fall Restrictions Weight Bearing Restrictions: No   Pertinent Vitals/Pain no apparent distress     Mobility  Bed Mobility Bed Mobility:  (pt presented to PT sittign in chair) Transfers Transfers: Sit to Stand;Stand to Sit Sit to Stand: 3: Mod assist;From chair/3-in-1;With upper extremity assist Stand to Sit: 3: Mod assist;With upper extremity assist;To chair/3-in-1 Details for Transfer Assistance: Much improved transfers today with cues for safety and hand placement; Cues to control descent with stand to sit Ambulation/Gait Ambulation/Gait Assistance: 1: +2 Total assist Ambulation/Gait: Patient Percentage: 70% Ambulation Distance (Feet): 80 Feet Assistive device: Rolling walker Ambulation/Gait Assistance Details: Second person for safety eith amb as this was the first time pt has walked in quite some time; Following cues for safety and posture well; Very slow, inefficient gait with short step and stride lengths; 3 stnading rest breaks; Physical assist for RW management around obstacles Gait Pattern: Decreased stride length;Decreased step length  - right;Decreased step length - left Gait velocity: quite slow    Exercises General Exercises - Lower Extremity Ankle Circles/Pumps: AROM;Both;20 reps;Seated Quad Sets: AROM;Both;10 reps;Seated   PT Diagnosis:    PT Problem List:   PT Treatment Interventions:     PT Goals Acute Rehab PT Goals Time For Goal Achievement: 04/27/12 Potential to Achieve Goals: Good Pt will go Sit to Stand: with min assist PT Goal: Sit to Stand - Progress: Progressing toward goal Pt will go Stand to Sit: with min assist PT Goal: Stand to Sit - Progress: Progressing toward goal Pt will Transfer Bed to Chair/Chair to Bed: with min assist PT Transfer Goal: Bed to Chair/Chair to Bed - Progress: Progressing toward goal Pt will Ambulate: 51 - 150 feet;with min assist;with rolling walker PT Goal: Ambulate - Progress: Progressing toward goal   Excellent progress; will consider increasing goals next session  Visit Information  Last PT Received On: 04/17/12 Assistance Needed: +1    Subjective Data  Subjective: Really wanting to walk   Cognition  Overall Cognitive Status: Appears within functional limits for tasks assessed/performed Arousal/Alertness: Awake/alert Orientation Level: Appears intact for tasks assessed Behavior During Session: [facility] for tasks performed Following Commands: Follows one step commands consistently    Balance     End of Session PT - End of Session Equipment Utilized During Treatment: Gait belt Activity Tolerance: Patient tolerated treatment well Patient left: in chair;with call bell/phone within reach (in video monitored room) Nurse Communication: Mobility status   GP     [place], [place], [place]  04/17/2012, 11:45 AM

## 2012-04-17 NOTE — Progress Notes (Signed)
Admitting pt to CIR today. Received authorization from [facility]. Met with patient and she is in agreement with plan. Dr Marye Round is in agreement. Pt's nurse is aware. Please call for questions: [phone]

## 2012-04-17 NOTE — Interval H&P Note (Signed)
[name] was admitted today to Inpatient Rehabilitation with the diagnosis of severe deconditioning after gastroenteritis.  The patient's history has been reviewed, patient examined, and there is no change in status.  Patient continues to be appropriate for intensive inpatient rehabilitation.  I have reviewed the patient's chart and labs.  Questions were answered to the patient's satisfaction.  [name] 04/17/2012, 10:11 PM

## 2012-04-17 NOTE — Plan of Care (Signed)
Overall Plan of Care [facility]) Patient Details Name: [name] MRN: [id number] DOB: 18-Feb-1946  Diagnosis:  Severe deconditioning  Primary Diagnosis:    Physical deconditioning Co-morbidities: sepsis, htn, glaucoma  Functional Problem List  Patient demonstrates impairments in the following areas: Balance, Bladder, Bowel, Edema, Endurance, Medication Management and Nutrition  Basic ADL's: grooming, bathing, dressing and toileting Advanced ADL's: simple meal preparation  Transfers:  bed mobility, bed to chair, toilet, tub/shower, car and furniture Locomotion:  ambulation and stairs  Additional Impairments:  Leisure Awareness  Anticipated Outcomes Item Anticipated Outcome  Eating/Swallowing    Basic self-care  Set-up -> Mod I  Tolieting  Mod independent  Bowel/Bladder  Continent bowel/bladder mod independent, prn meds for bowel  Transfers  Mod I overall; S car  Locomotion  Mod I gait; S stairs  Communication    Cognition    Pain  3 or less 1-10 scale   Safety/Judgment  Supervision   Other  Skin: no additional breakdown, min assist of caregiver for breakdown prevention interventions and use of interdry   Therapy Plan: PT Frequency: 1-2 X/day, 60-90 minutes OT Frequency: 1-2 X/day, 60-90 minutes     Team Interventions: Item RN PT OT SLP SW TR Other  Self Care/Advanced ADL Retraining  x x      Neuromuscular Re-Education  x x      Therapeutic Activities  x x      UE/LE Strength Training/ROM  x x      UE/LE Coordination Activities  x x      Visual/Perceptual Remediation/Compensation         DME/Adaptive Equipment Instruction  x x      Therapeutic Exercise  x x      Balance/Vestibular Training  x x      Patient/Family Education x x x      Cognitive Remediation/Compensation         Functional Mobility Training  x x      Ambulation/Gait Training  x x      Stair Training  x       Wheelchair Propulsion/Positioning  x x      Functional Nurse, mental health Reintegration  x x      Dysphagia/Aspiration Environmental consultant         Bladder Management x        Bowel Management x        Disease Management/Prevention x x x      Pain Management x x       Medication Management x        Skin Care/Wound Management x x x      Splinting/Orthotics   x      Discharge Planning  x x      Psychosocial Support  x x                         Team Discharge Planning: Destination:  Home Projected Follow-up:  PT, Outpatient and additional OT is TBD Projected Equipment Needs:  None Patient/family involved in discharge planning:  Yes  MD ELOS: one week Medical Rehab Prognosis:  Excellent Assessment: pt admitted for cir therapies. Goals are mod I to occasional supervision. The team will be addressing stregnth, balance, fxnl mobility, safety, self-care, skin care.

## 2012-04-17 NOTE — Progress Notes (Signed)
Patient admitted to [facility] at 1630. Alert and oriented x4, denies pain. Patient voided in bathroom upon arrival, PVR scan 11cc. Patient oriented to unit, call bell system, rehab schedule, and safety plan. Understanding verbalized. [name]

## 2012-04-17 NOTE — H&P (Signed)
Physical Medicine and Rehabilitation Admission H&P    Chief Complaint  Patient presents with  . Sepsis with encephalopathy.   : HPI: [name] is a 66 y.o. female with history of HTN, nephrolithiasis; who was admitted on 10/15 with progressive pain with fever, chills, dysuria and confusion. She was noted to be hypoxic and in uroseptic shock due to & mm obstructing stone in left ureter. She developed respiratory failure requiring intubation. She was taken to OR on 10/16 for L-JJ stent placement by Dr. Gaynelle Arabian. BC/UC positive for E.Coli. Was treated with sepsis protocol and had prolonged intubation. Cardiac echo done revealing diffuse hypokinesis with EF 20-25% likely due to septic CM. She developed A-fib and converted to NSR on amiodarone. NSTEMI likely due to demand ischemia. She has been confused with agitation and was treated with propofol and MIND trial for delirium. Neurology consulted for input and felt that patient with acute metabolic encephalopathy. Lactic acid and ammonia levels normal. EEG with generalized background slowing.   Repeat cardiac echo done showing improvement in EF to 60-65%. Dr. Percival Spanish consulted for input and recommended continuing amiodarone 400 mg bid with transition to 200 mg daily at discharge. No indications for coumadin at this time. Issues with acute renal failure, fluid overload, leucocytosis and thrombocytopenia resolving. She was extubated on 10/23 and NPO due to concerns of dysphagia. FEES done  and patient started on regular diet. PT/OT evaluations done and patient noted to be deconditioned.confusion and paranoia is resolving.  Mentation is improving but patient continues with slow movements. Therapy team recommending CIR.   Review of Systems  HENT: Negative for hearing loss.   Eyes: Negative for blurred vision, double vision and photophobia.  Respiratory: Negative for cough, shortness of breath and wheezing.   Cardiovascular: Positive for leg swelling.  Negative for chest pain and palpitations.  Gastrointestinal: Positive for diarrhea. Negative for abdominal pain.  Neurological: Positive for tingling (toes). Negative for dizziness and headaches.  Psychiatric/Behavioral: Negative for hallucinations. The patient is not nervous/anxious.    Past Medical History  Diagnosis Date  . Hypertension   . Pneumonia   . GERD (gastroesophageal reflux disease)   . Headache    Past Surgical History  Procedure Date  . Abdominal hysterectomy   . Cholecystectomy   . Cystoscopy w/ ureteral stent placement 04/04/2012    Procedure: CYSTOSCOPY WITH RETROGRADE PYELOGRAM/URETERAL STENT PLACEMENT;  Surgeon: Ailene Rud, MD;  Location: [place];  Service: Urology;  Laterality: Left;   History reviewed. No pertinent family history.  Social History:  Daughter live with her.  Was laid off and just started part time job. She reports that she has never smoked. She does not have any smokeless tobacco history on file. She reports that she does not drink alcohol or use illicit drugs.  Allergies: No Known Allergies  Medications Prior to Admission  Medication Sig Dispense Refill  . bimatoprost (LUMIGAN) 0.01 % SOLN Place 1 drop into both eyes at bedtime.      Marland Kitchen DISCONTD: hydrochlorothiazide (MICROZIDE) 12.5 MG capsule Take 12.5 mg by mouth daily.      Marland Kitchen DISCONTD: lisinopril (PRINIVIL,ZESTRIL) 5 MG tablet Take 5 mg by mouth daily.        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 Status:  Mobility: Bed Mobility Bed Mobility:  (pt presented to PT sittign in chair) Supine to Sit: 3: Mod assist;With rails Supine to Sit: Patient Percentage: 50% Sitting - Scoot to [employer] of Bed: 4: Min assist Sitting - Scoot to [employer] of Bed: Patient Percentage: 50% Sit to Supine: 4: Min assist;HOB flat Transfers Transfers: Sit to Stand;Stand to Sit Sit to Stand: 3: Mod assist;From chair/3-in-1;With upper extremity assist Sit to Stand: Patient Percentage: 60% Stand to Sit: 3: Mod assist;With upper extremity assist;To chair/3-in-1 Stand to Sit: Patient Percentage: 50% Stand Pivot Transfers: 1: +2 Total assist Stand Pivot Transfers: Patient Percentage: 50% Ambulation/Gait Ambulation/Gait Assistance: 1: +2 Total assist Ambulation/Gait: Patient Percentage: 70% Ambulation Distance (Feet): 80 Feet Assistive device: Rolling walker Ambulation/Gait Assistance Details: Second person for safety eith amb as this was the first time pt has walked in quite some time; Following cues for safety and posture well; Very slow, inefficient gait with short step and stride lengths; 3 stnading rest breaks; Physical assist for RW management around obstacles Gait Pattern: Decreased stride length;Decreased step length - right;Decreased step length - left Gait velocity: quite slow Stairs: No Wheelchair Mobility Wheelchair Mobility: No  ADL: ADL Eating/Feeding: +1 Total assistance (Pt unable to manipulate utensils) Where Assessed - Eating/Feeding: Chair Grooming: Performed;Wash/dry face;Supervision/safety Where Assessed - Grooming: Unsupported sitting Upper Body Bathing: +1 Total assistance Where Assessed - Upper Body Bathing: Supported sitting Lower Body Bathing: +1 Total assistance Where Assessed - Lower Body Bathing: Supported sit to stand Upper Body  Dressing: +1 Total assistance Where Assessed - Upper Body Dressing: Supported sitting Lower Body Dressing: +1 Total assistance Where Assessed - Lower Body Dressing: Supported sit to Lobbyist: +2 Total assistance Toilet Transfer Method: Stand pivot;Sit to Loss adjuster, chartered: Other (comment) Building services engineer) Tub/Shower Transfer:  (Not appropriate) Equipment Used: Gait belt Transfers/Ambulation Related to ADLs: Not assessed this session ADL Comments: Focus of sesson on increasing activity tolerance with sitting tasks EOB.  Pt washed face using bil UE with min verbal cueing to stay on task (pt very talkative).  Pt performed pushups from bed x5 with min assist to increase bil UE strength in prep for functional transfers.  Pt also performed bil UE ROM exercises but would benefit from  theraband next session. Bil UE strength 3+/5 throughout.  Cognition: Cognition Arousal/Alertness: Awake/alert Orientation Level: Oriented X4 Cognition Overall Cognitive Status: Appears within functional limits for tasks assessed/performed Area of Impairment: Attention;Following commands Arousal/Alertness: Awake/alert Orientation Level: Appears intact for tasks assessed Behavior During Session: [facility] for tasks performed Current Attention Level: Sustained Attention - Other Comments: 10 seconds Following Commands: Follows one step commands consistently Safety/Judgement: Decreased safety judgement for tasks assessed;Impulsive Awareness of Errors: Assistance required to identify errors made;Assistance required to correct errors made Problem Solving: mod A Cognition - Other Comments: pt focused on lunch and eating.     Blood pressure 106/51, pulse 97, temperature 97.8 F (36.6 C), temperature source Oral, resp. rate 19, height 5\' 4"  (1.626 m), weight 104.191 kg (229 lb 11.2 oz), SpO2 97.00%. Physical Exam  Nursing note and vitals reviewed. Constitutional: She is oriented to person, place, and  time. She appears well-developed and well-nourished.  Obese female  HENT:  Head: Normocephalic and atraumatic.  Mouth/Throat: Oropharynx is clear and moist.  Eyes: Conjunctivae normal and EOM are normal. Pupils are equal, round, and reactive to light.  Neck: Normal range of motion. Neck supple. No JVD present. No tracheal deviation present. No thyromegaly present.  Cardiovascular: Normal rate, regular rhythm and normal heart sounds.  Exam reveals no gallop and no friction rub.   No murmur heard. Pulmonary/Chest: Effort normal and breath sounds normal. No respiratory distress. She has no wheezes.  Abdominal: Soft. Bowel sounds are normal. She exhibits no distension. There is no tenderness. There is no rebound.  Musculoskeletal: She exhibits edema.       2+ pedal edema.  Minimal pretibial edema noted.   Neurological: She is alert and oriented to person, place, and time.       Follows commands without difficulty.  Paranoia and confusion has resolved.  Speech clear.  Strength grossly 4/5 UE and 2-3/5 prox legs to 4/5 distally. No sensory deficits.   Skin: Skin is warm.       MASD under bilateral breast with linear skin tears.  Toes on bilateral feet with evidence of ischemia--reddened with crusted drainage. Macerated/wet areas between toes--?candidal component. R-2nd toe with black eschar on tip. Right lateral forefoot with streaky erythema. Multiple toes with signs of ischemia to varying degrees. Feet are sensitive to touch.  Psychiatric: She has a normal mood and affect. Her speech is normal and behavior is normal. Judgment and thought content normal. Her mood appears not anxious. Thought content is not paranoid and not delusional. Cognition and memory are normal.    BASIC METABOLIC PANEL     Status: Abnormal   Collection Time   04/17/12  6:40 AM      Component Value Range Comment   Sodium 143  135 - 145 mEq/L    Potassium 3.3 (*) 3.5 - 5.1 mEq/L    Chloride 106  96 - 112 mEq/L    CO2  27  19 - 32 mEq/L    Glucose, Bld 106 (*) 70 - 99 mg/dL    BUN 24 (*) 6 - 23 mg/dL    Creatinine, Ser 1.60 (*) 0.50 - 1.10 mg/dL    Calcium 8.4  8.4 - 10.5 mg/dL    GFR calc non Af Amer 32 (*) >90 mL/min    GFR calc Af Amer 37 (*) >90 mL/min   CBC     Status: Abnormal   Collection Time   04/17/12  6:40 AM      Component Value Range Comment   WBC 8.3  4.0 - 10.5 K/uL    RBC 3.51 (*) 3.87 - 5.11 MIL/uL    Hemoglobin 9.9 (*) 12.0 - 15.0 g/dL    HCT 30.3 (*) 36.0 - 46.0 %    MCV 86.3  78.0 - 100.0 fL    MCH 28.2  26.0 - 34.0 pg    MCHC 32.7  30.0 - 36.0 g/dL    RDW 15.8 (*) 11.5 - 15.5 %    Platelets 215  150 - 400 K/uL    No results found.  Post Admission Physician Evaluation: 1. Functional deficits secondary  to severe deconditioning after septic shock with resolving metabolic encephalopathy. 2. Patient is admitted to receive collaborative, interdisciplinary care between the physiatrist, rehab nursing staff, and therapy team. 3. Patient's level of medical complexity and substantial therapy needs in context of that medical necessity cannot be provided at a lesser intensity of care such  as a SNF. 4. Patient has experienced substantial functional loss from his/her baseline which was documented above under the "Functional History" and "Functional Status" headings.  Judging by the patient's diagnosis, physical exam, and functional history, the patient has potential for functional progress which will result in measurable gains while on inpatient rehab.  These gains will be of substantial and practical use upon discharge  in facilitating mobility and self-care at the household level. 5. Physiatrist will provide 24 hour management of medical needs as well as oversight of the therapy plan/treatment and provide guidance as appropriate regarding the interaction of the two. 6. 24 hour rehab nursing will assist with bladder management, bowel management, safety, skin/wound care, disease management,  medication administration, pain management and patient education  and help integrate therapy concepts, techniques,education, etc. 7. PT will assess and treat for:  fxnl mobility, strength, stamina, pain mgt, safety, adaptive techniques and equipment.  Goals are: supervision to min assist. 8. OT will assess and treat for: ues, rom, safety , ADL's, stamina, balance, adaptive equipment.   Goals are: supervision to mod assist. 9. SLP will assess and treat for: n/a.  Goals are: n/a. 10. Case Management and Social Worker will assess and treat for psychological issues and discharge planning. 11. Team conference will be held weekly to assess progress toward goals and to determine barriers to discharge. 12. Patient will receive at least 3 hours of therapy per day at least 5 days per week. 13. ELOS: 2-3 weeks      Prognosis:  excellent   Medical Problem List and Plan: 1. DVT Prophylaxis/Anticoagulation: Pharmaceutical: Lovenox 2. Pain Management:  Complains of toes hurting.  Support shoes recommended for protection of ischemic toes.  Will treat with prn tylenol for now.  3. Mood: motivated. Seems to be dealing with current situation well. LCSW  To follow up for formal evaluation.  4. Neuropsych: This patient is capable of making decisions on his/her own behalf. 5. Fluid overload:  Resolved. Will continue to monitor weight every other day. Continue low salt diet.  Continue lasix daily.  6. Atrial fibrillation: monitor HR with bid checks. Continue amiodarone.  7. Diarrhea:  On probiotics.  8. NSTEMI:   Due to demand ischemia. No complaints of CP/dyspnea 9. Ischemic toes:  With ?interdigital candidal infection. Cellulitis v/s ischemia right lateral forefoot. ?VVS input for monitoring.   10. Hypokalemia: due to diuretics.  Increase supplement.  Recheck in am.  11. Glaucoma: continue lumigan bid.  12. Candidal infection: difucan D#2  04/17/2012, Oval Linsey, MD

## 2012-04-18 ENCOUNTER — Inpatient Hospital Stay (HOSPITAL_COMMUNITY): Payer: Medicare Other | Admitting: Occupational Therapy

## 2012-04-18 ENCOUNTER — Inpatient Hospital Stay (HOSPITAL_COMMUNITY): Payer: Medicare Other

## 2012-04-18 DIAGNOSIS — R5381 Other malaise: Secondary | ICD-10-CM

## 2012-04-18 DIAGNOSIS — G9341 Metabolic encephalopathy: Secondary | ICD-10-CM

## 2012-04-18 DIAGNOSIS — A4189 Other specified sepsis: Secondary | ICD-10-CM

## 2012-04-18 LAB — COMPREHENSIVE METABOLIC PANEL
ALT: 23 U/L (ref 0–35)
AST: 20 U/L (ref 0–37)
Albumin: 2.2 g/dL — ABNORMAL LOW (ref 3.5–5.2)
Alkaline Phosphatase: 106 U/L (ref 39–117)
BUN: 23 mg/dL (ref 6–23)
CO2: 28 mEq/L (ref 19–32)
Calcium: 8.3 mg/dL — ABNORMAL LOW (ref 8.4–10.5)
Chloride: 100 mEq/L (ref 96–112)
Creatinine, Ser: 1.69 mg/dL — ABNORMAL HIGH (ref 0.50–1.10)
GFR calc Af Amer: 35 mL/min — ABNORMAL LOW (ref >90)
GFR calc non Af Amer: 30 mL/min — ABNORMAL LOW (ref >90)
Glucose, Bld: 97 mg/dL (ref 70–99)
Potassium: 3.4 mEq/L — ABNORMAL LOW (ref 3.5–5.1)
Sodium: 136 mEq/L (ref 135–145)
Total Bilirubin: 0.3 mg/dL (ref 0.3–1.2)
Total Protein: 5.4 g/dL — ABNORMAL LOW (ref 6.0–8.3)

## 2012-04-18 LAB — CBC WITH DIFFERENTIAL/PLATELET
Basophils Absolute: 0.1 10*3/uL (ref 0.0–0.1)
Basophils Relative: 1 % (ref 0–1)
Eosinophils Absolute: 0.2 10*3/uL (ref 0.0–0.7)
Eosinophils Relative: 3 % (ref 0–5)
HCT: 30.3 % — ABNORMAL LOW (ref 36.0–46.0)
Hemoglobin: 10.1 g/dL — ABNORMAL LOW (ref 12.0–15.0)
Lymphocytes Relative: 24 % (ref 12–46)
Lymphs Abs: 1.6 10*3/uL (ref 0.7–4.0)
MCH: 28.9 pg (ref 26.0–34.0)
MCHC: 33.3 g/dL (ref 30.0–36.0)
MCV: 86.8 fL (ref 78.0–100.0)
Monocytes Absolute: 0.4 10*3/uL (ref 0.1–1.0)
Monocytes Relative: 6 % (ref 3–12)
Neutro Abs: 4.2 10*3/uL (ref 1.7–7.7)
Neutrophils Relative %: 65 % (ref 43–77)
Platelets: 229 10*3/uL (ref 150–400)
RBC: 3.49 MIL/uL — ABNORMAL LOW (ref 3.87–5.11)
RDW: 15.8 % — ABNORMAL HIGH (ref 11.5–15.5)
WBC: 6.4 10*3/uL (ref 4.0–10.5)

## 2012-04-18 MED ORDER — NYSTATIN 100000 UNIT/GM EX POWD
Freq: Two times a day (BID) | CUTANEOUS | Status: DC
  Administered 2012-04-18 – 2012-04-24 (×8): via TOPICAL
  Filled 2012-04-18: qty 15

## 2012-04-18 MED ORDER — PSEUDOEPHEDRINE HCL ER 120 MG PO TB12
120.0000 mg | ORAL_TABLET | Freq: Every day | ORAL | Status: DC
  Administered 2012-04-18 – 2012-04-23 (×6): 120 mg via ORAL
  Filled 2012-04-18 (×8): qty 1

## 2012-04-18 MED ORDER — LORATADINE 10 MG PO TABS
10.0000 mg | ORAL_TABLET | Freq: Every day | ORAL | Status: DC
  Administered 2012-04-18 – 2012-04-24 (×7): 10 mg via ORAL
  Filled 2012-04-18 (×9): qty 1

## 2012-04-18 NOTE — Evaluation (Signed)
Physical Therapy Assessment and Plan  Patient Details  Name: [name] MRN: [id number] Date of Birth: Dec 07, 1945  PT Diagnosis: Abnormality of gait, Difficulty walking, Edema and Muscle weakness Rehab Potential: Excellent ELOS: 7 days   Today's Date: 04/18/2012 Time: 1030-1130 Time Calculation (min): 60 min  Problem List:  Patient Active Problem List  Diagnosis  . Septic shock  . Pyelonephritis  . Hypoxemia  . Encephalopathy  . Nephrolithiasis  . AKI (acute kidney injury)  . Glaucoma  . Physical deconditioning    Past Medical History:  Past Medical History  Diagnosis Date  . Hypertension   . Pneumonia   . GERD (gastroesophageal reflux disease)   . Headache   . Glaucoma    Past Surgical History:  Past Surgical History  Procedure Date  . Abdominal hysterectomy   . Cholecystectomy   . Cystoscopy w/ ureteral stent placement 04/04/2012    Procedure: CYSTOSCOPY WITH RETROGRADE PYELOGRAM/URETERAL STENT PLACEMENT;  Surgeon: Ailene Rud, MD;  Location: [facility];  Service: Urology;  Laterality: Left;    Assessment & Plan Clinical Impression: Patient is a 66 y.o. year old female with recent admission to the hospital with history of HTN, nephrolithiasis; who was admitted on 10/15 with progressive pain with fever, chills, dysuria and confusion. She was noted to be hypoxic and in uroseptic shock due to & mm obstructing stone in left ureter. She developed respiratory failure requiring intubation. She was taken to OR on 10/16 for L-JJ stent placement by Dr. Gaynelle Arabian. BC/UC positive for E.Coli. Was treated with sepsis protocol and had prolonged intubation. Cardiac echo done revealing diffuse hypokinesis with EF 20-25% likely due to septic CM. She developed A-fib and converted to NSR on amiodarone. NSTEMI likely due to demand ischemia. She has been confused with agitation and was treated with propofol and MIND trial for delirium. Neurology consulted for input and felt that  patient with acute metabolic encephalopathy. Lactic acid and ammonia levels normal. EEG with generalized background slowing.   Repeat cardiac echo done showing improvement in EF to 60-65%. Dr. Percival Spanish consulted for input and recommended continuing amiodarone 400 mg bid with transition to 200 mg daily at discharge. No indications for coumadin at this time. Issues with acute renal failure, fluid overload, leucocytosis and thrombocytopenia resolving. She was extubated on 10/23 and NPO due to concerns of dysphagia. FEES done and patient started on regular diet. PT/OT evaluations done and patient noted to be deconditioned.confusion and paranoia is resolving. Mentation is improving but patient continues with slow movements.  Patient transferred to CIR on 04/17/2012 .   Patient currently requires min with mobility secondary to muscle weakness, decreased cardiorespiratoy endurance and decreased standing balance and decreased balance strategies.  Prior to hospitalization, patient was independent with mobility and lived with Daughter in a House home.  Home access is back entrance ramped and one step to get into kitchenRamped entrance.  Patient will benefit from skilled PT intervention to maximize safe functional mobility, minimize fall risk and decrease caregiver burden for planned discharge home with 24 hour supervision.  Anticipate patient will benefit from follow up OP at discharge.  PT - End of Session Endurance Deficit: Yes PT Assessment Rehab Potential: Excellent Barriers to Discharge: None PT Plan PT Frequency: 1-2 X/day, 60-90 minutes Estimated Length of Stay: 7 days PT Treatment/Interventions: Ambulation/gait training;Balance/vestibular training;Community reintegration;Discharge planning;DME/adaptive equipment instruction;Functional mobility training;Neuromuscular re-education;Pain management;Patient/family education;Psychosocial support;Skin care/wound management;Stair training;Therapeutic  Activities;Therapeutic Exercise;UE/LE Strength taining/ROM;UE/LE Coordination activities;Wheelchair propulsion/positioning  PT Evaluation Precautions/Restrictions Precautions Precautions: Fall  Restrictions Weight Bearing Restrictions: No   Pain Pain Assessment Pain Assessment: 0-10 Pain Score: 0-No pain Patients Stated Pain Goal: 3 Multiple Pain Sites: No Home Living/Prior Functioning Home Living Lives With: Daughter Available Help at Discharge: Family;Available 24 hours/day Type of Home: House Home Access: Ramped entrance Entrance Stairs-Number of Steps: back entrance ramped and one step to get into kitchen Entrance Stairs-Rails: Left;Right;Can reach both Home Layout: One level Bathroom Shower/Tub: Walk-in shower;Door [employer] Toilet: Handicapped height Bathroom Accessibility: Yes Home Adaptive Equipment: Built-in shower seat;Grab bars in shower;Walker - rolling Additional Comments: Patient has friend with a lot of DME for patient to use at d/c Prior Function Level of Independence: Independent with basic ADLs;Independent with homemaking with ambulation;Independent with gait;Independent with transfers Able to Take Stairs?: Yes Driving: Yes Vocation: Retired (semi-retired) Vision/Perception  Vision - History Baseline Vision: Wears glasses only for reading Patient Visual Report: No change from baseline Vision - Assessment Eye Alignment: Within Functional Limits Additional Comments: Had cataract surgery a couple years ago per patient report Perception Perception: Within Functional Limits Praxis Praxis: Intact  Cognition Overall Cognitive Status: Appears within functional limits for tasks assessed Arousal/Alertness: Awake/alert Orientation Level: Oriented to person;Oriented to place;Oriented to time;Other (comment) (can be forgetful) Memory: Appears intact Awareness: Appears intact Problem Solving: Appears intact Safety/Judgment: Appears  intact Sensation Sensation Light Touch: Appears Intact Coordination Gross Motor Movements are Fluid and Coordinated: Yes Motor  Motor Motor: Within Functional Limits (generalized deconditioning)  Locomotion  Ambulation Ambulation/Gait Assistance: 4: Min assist  Trunk/Postural Assessment  Cervical Assessment Cervical Assessment: Within Functional Limits Thoracic Assessment Thoracic Assessment: Within Functional Limits Lumbar Assessment Lumbar Assessment: Within Functional Limits  Balance Balance Balance Assessed: Yes Berg Balance Test Sit to Stand: Needs minimal aid to stand or to stabilize Standing Unsupported: Able to stand 2 minutes with supervision Sitting with Back Unsupported but Feet Supported on Floor or Stool: Able to sit safely and securely 2 minutes Stand to Sit: Controls descent by using hands Transfers: Needs one person to assist Standing Unsupported with Eyes Closed: Able to stand 10 seconds with supervision Standing Ubsupported with Feet Together: Needs help to attain position but able to stand for 30 seconds with feet together From Standing, Reach Forward with Outstretched Arm: Can reach forward >5 cm safely (2") From Standing Position, Pick up Object from Floor: Unable to try/needs assist to keep balance (able to pick up shoe with min A) From Standing Position, Turn to Look Behind Over each Shoulder: Looks behind one side only/other side shows less weight shift Turn 360 Degrees: Needs assistance while turning (steady A) Standing Unsupported, Alternately Place Feet on Step/Stool: Needs assistance to keep from falling or unable to try Standing Unsupported, One Foot in Front: Loses balance while stepping or standing Standing on One Leg: Unable to try or needs assist to prevent fall Total Score: 21  Static Sitting Balance Static Sitting - Level of Assistance: 5: Stand by assistance Dynamic Sitting Balance Dynamic Sitting - Level of Assistance: 5: Stand by  assistance Static Standing Balance Static Standing - Level of Assistance: 4: Min assist Dynamic Standing Balance Dynamic Standing - Level of Assistance: 4: Min assist Extremity Assessment  RUE Assessment RUE Assessment: Within Functional Limits (can benefit from functional strengthening) LUE Assessment LUE Assessment: Within Functional Limits (can benefit from functional strengthening) RLE Assessment RLE Assessment: Exceptions to [facility] (decreased muscular endurance 4/5 grossly) LLE Assessment LLE Assessment: Exceptions to [facility] (decreased muscular endurance; 4/5 grossly)  See FIM for current functional status Refer to  Care Plan for Long Term Goals  Recommendations for other services: None  Discharge Criteria: Patient will be discharged from PT if patient refuses treatment 3 consecutive times without medical reason, if treatment goals not met, if there is a change in medical status, if patient makes no progress towards goals or if patient is discharged from hospital.  The above assessment, treatment plan, treatment alternatives and goals were discussed and mutually agreed upon: by patient  Individual treatment initiated with focus on gait, transfers, administered and discussed Berg Balance test, and discussed overall goals of stay. Excellent motivation and participation.  Canary Brim [facility] 04/18/2012, 11:39 AM

## 2012-04-18 NOTE — Plan of Care (Signed)
Problem: RH BLADDER ELIMINATION Goal: RH STG MANAGE BLADDER WITH ASSISTANCE STG Manage Bladder With mod independence  Outcome: Progressing Patient incontinent of bladder today x 3, pad in underwear, offer toileting q 3 hours and prn

## 2012-04-18 NOTE — Progress Notes (Signed)
Physical Therapy Session Note  Patient Details  Name: RAYNAH GOLDBERG MRN: [id number] Date of Birth: Mar 19, 1946  Today's Date: 04/18/2012 Time: 1430-1500 Time Calculation (min): 30 min  Short Term Goals: Week 1:  PT Short Term Goal 1 (Week 1): = LTGs  Skilled Therapeutic Interventions/Progress Updates:   Treatment focused on dynamic standing balance and balance reaction activity to shoot basketball and then increased challenge by standing on compliant surface initially steady A and progressed to close S. Gait with RW for endurance and strengthening and navigating obstacles with overall S.  Therapy Documentation Precautions:  Precautions Precautions: Fall Restrictions Weight Bearing Restrictions: No   Pain:  No complaints.  See FIM for current functional status  Therapy/Group: Individual Therapy  Canary Brim [facility] 04/18/2012, 5:20 PM

## 2012-04-18 NOTE — Progress Notes (Signed)
Subjective/Complaints: Occasional cough. Has seasonal allergies. Overall feeling well.  Objective: Vital Signs: Blood pressure 93/58, pulse 82, temperature 98.7 F (37.1 C), temperature source Oral, resp. rate 19, height 5\' 4"  (1.626 m), weight 101.8 kg (224 lb 6.9 oz), SpO2 94.00%. No results found.  Basename 04/17/12 0640 04/16/12 0648  WBC 8.3 8.5  HGB 9.9* 9.7*  HCT 30.3* 29.9*  PLT 215 197    Basename 04/17/12 0640 04/16/12 0648  NA 143 143  K 3.3* 3.6  CL 106 110  CO2 27 24  GLUCOSE 106* 94  BUN 24* 26*  CREATININE 1.60* 1.49*  CALCIUM 8.4 8.4   CBG (last 3)  No results found for this basename: GLUCAP:3 in the last 72 hours  Wt Readings from Last 3 Encounters:  04/17/12 101.8 kg (224 lb 6.9 oz)  04/16/12 104.191 kg (229 lb 11.2 oz)  04/16/12 104.191 kg (229 lb 11.2 oz)    Physical Exam:  Nursing note and vitals reviewed.  Constitutional: She is oriented to person, place, and time. She appears well-developed and well-nourished.  Obese female  HENT:  Head: Normocephalic and atraumatic.  Mouth/Throat: Oropharynx is clear and moist.  Eyes: Conjunctivae normal and EOM are normal. Pupils are equal, round, and reactive to light.  Neck: Normal range of motion. Neck supple. No JVD present. No tracheal deviation present. No thyromegaly present.  Cardiovascular: Normal rate, regular rhythm and normal heart sounds. Exam reveals no gallop and no friction rub.  No murmur heard.  Pulmonary/Chest: Effort normal and breath sounds normal. No respiratory distress. She has no wheezes.  Abdominal: Soft. Bowel sounds are normal. She exhibits no distension. There is no tenderness. There is no rebound.  Musculoskeletal: She exhibits edema.  2+ pedal edema. Minimal pretibial edema noted.  Neurological: She is alert and oriented to person, place, and time.  Follows commands without difficulty. Paranoia and confusion has resolved. Speech clear. Strength grossly 4/5 UE and 2-3/5 prox  legs to 4/5 distally. No sensory deficits.  Skin: Skin is warm.  MASD under bilateral breast with linear skin tears. Toes on bilateral feet with evidence of ischemia--reddened with crusted drainage. Slightly red between toes R-2nd toe with black eschar on tip. Right lateral forefoot with streaky erythema. Multiple toes with signs of ischemia to varying degrees. Feet are sensitive to touch.  Psychiatric: She has a normal mood and affect. Her speech is normal and behavior is normal. Judgment and thought content normal. Her mood appears not anxious. Thought content is not paranoid and not delusional. Cognition and memory are normal.    Assessment/Plan: 1. Functional deficits secondary to deconditioning after septic shock with subsequent metabolic encephalopathy which require 3+ hours per day of interdisciplinary therapy in a comprehensive inpatient rehab setting. Physiatrist is providing close team supervision and 24 hour management of active medical problems listed below. Physiatrist and rehab team continue to assess barriers to discharge/monitor patient progress toward functional and medical goals. FIM:          FIM - Radio producer Devices: Grab bars Toilet Transfers: 4-To toilet/BSC: Min A (steadying Pt. > 75%);4-From toilet/BSC: Min A (steadying Pt. > 75%)  FIM - Bed/Chair Transfer Bed/Chair Transfer Assistive Devices: HOB elevated;Arm rests;Bed rails Bed/Chair Transfer: 4: Bed > Chair or W/C: Min A (steadying Pt. > 75%);4: Sit > Supine: Min A (steadying pt. > 75%/lift 1 leg);4: Supine > Sit: Min A (steadying Pt. > 75%/lift 1 leg);4: Chair or W/C > Bed: Min A (steadying Pt. > 75%)  Comprehension Comprehension Mode: Auditory Comprehension: 6-Follows complex conversation/direction: With extra time/assistive device  Expression Expression Mode: Verbal  Social Interaction Social Interaction: 6-Interacts appropriately with others with medication or extra  time (anti-anxiety, antidepressant).  Problem Solving Problem Solving: 6-Solves complex problems: With extra time  Memory Memory: 6-More than reasonable amt of time  Medical Problem List and Plan:  1. DVT Prophylaxis/Anticoagulation: Pharmaceutical: Lovenox  2. Pain Management: Complains of toes hurting. Support shoes recommended for protection of ischemic toes. Will treat with prn tylenol for now.  3. Mood: motivated. Seems to be dealing with current situation well. LCSW To follow up for formal evaluation.  4. Neuropsych: This patient is capable of making decisions on his/her own behalf.  5. Fluid overload: Resolved. Will continue to monitor weight every other day. Continue low salt diet. Continue lasix daily.  6. Atrial fibrillation: monitor HR with bid checks. Continue amiodarone.  7. Diarrhea: On probiotics.  8. NSTEMI: Due to demand ischemia. No complaints of CP/dyspnea  9. Ischemic toes: due to sepsis/shock  -should be able to manage conservatively. See below 10. Hypokalemia: due to diuretics. Increase supplement. Recheck in am.  11. Glaucoma: continue lumigan bid.  12. Candidal infection: difucan D#3--can dc after today's dose  -use microguard powder for skin folds if needed  -keep feet dry and clean--i think toes are more showing signs of the ischemia than anything else.  LOS (Days) 1 A FACE TO FACE EVALUATION WAS PERFORMED  [name] 04/18/2012, 6:48 AM

## 2012-04-18 NOTE — Progress Notes (Signed)
Patient information reviewed and entered into eRehab system by Koen Antilla, RN, CRRN, PPS Coordinator.  Information including medical coding and functional independence measure will be reviewed and updated through discharge.     Per nursing patient was given "Data Collection Information Summary for Patients in Inpatient Rehabilitation Facilities with attached "Privacy Act Statement-Health Care Records" upon admission.  

## 2012-04-18 NOTE — Evaluation (Signed)
Occupational Therapy Assessment and Plan & Session Notes  Patient Details  Name: [name] MRN: [id number] Date of Birth: [birth year]  OT Diagnosis: acute pain and muscle weakness (generalized) Rehab Potential: Rehab Potential: Good ELOS: 7-10 days   Today's Date: 04/18/2012  ASSESSMENT AND PLAN  Problem List:  Patient Active Problem List  Diagnosis  . Septic shock  . Pyelonephritis  . Hypoxemia  . Encephalopathy  . Nephrolithiasis  . AKI (acute kidney injury)  . Glaucoma    Past Medical History:  Past Medical History  Diagnosis Date  . Hypertension   . Pneumonia   . GERD (gastroesophageal reflux disease)   . Headache   . Glaucoma    Past Surgical History:  Past Surgical History  Procedure Date  . Abdominal hysterectomy   . Cholecystectomy   . Cystoscopy w/ ureteral stent placement 04/04/2012    Procedure: CYSTOSCOPY WITH RETROGRADE PYELOGRAM/URETERAL STENT PLACEMENT;  Surgeon: Ailene Rud, MD;  Location: [facility];  Service: Urology;  Laterality: Left;   Clinical Impression: [name] is a 66 y.o. female with history of HTN, nephrolithiasis; who was admitted on 10/15 with progressive pain with fever, chills, dysuria and confusion. She was noted to be hypoxic and in uroseptic shock due to & mm obstructing stone in left ureter. She developed respiratory failure requiring intubation. She was taken to OR on 10/16 for L-JJ stent placement by Dr. Gaynelle Arabian. BC/UC positive for E.Coli. Was treated with sepsis protocol and had prolonged intubation. Cardiac echo done revealing diffuse hypokinesis with EF 20-25% likely due to septic CM. She developed A-fib and converted to NSR on amiodarone. NSTEMI likely due to demand ischemia. She has been confused with agitation and was treated with propofol and MIND trial for delirium. Neurology consulted for input and felt that patient with acute metabolic encephalopathy. Lactic acid and ammonia levels normal. EEG with generalized  background slowing.   Repeat cardiac echo done showing improvement in EF to 60-65%. Dr. Percival Spanish consulted for input and recommended continuing amiodarone 400 mg bid with transition to 200 mg daily at discharge. No indications for coumadin at this time. Issues with acute renal failure, fluid overload, leucocytosis and thrombocytopenia resolving. She was extubated on 10/23 and NPO due to concerns of dysphagia. FEES done and patient started on regular diet. PT/OT evaluations done and patient noted to be deconditioned.confusion and paranoia is resolving. Mentation is improving but patient continues with slow movements. Therapy team recommending CIR. Patient transferred to CIR on 04/17/2012 .    Patient currently requires min-total assist with basic self-care skills and IADL secondary to muscle weakness and decreased sitting balance, decreased standing balance, decreased postural control and decreased balance strategies.  Prior to hospitalization, patient could complete ADLs and IADLs independently, patient was also driving and working part-time.   Patient will benefit from skilled intervention to increase independence with basic self-care skills prior to discharge home with daughter prn.  Anticipate patient will require intermittent supervision and additional occupational therapy is TBD. Goals set at an overall set-up -> Mod I level for d/c to home.   OT - End of Session Activity Tolerance: Tolerates 10 - 20 min activity with multiple rests Endurance Deficit: Yes OT Assessment Rehab Potential: Good Barriers to Discharge: None (none known at this time) OT Plan OT Frequency: 1-2 X/day, 60-90 minutes Estimated Length of Stay: 7-10 days OT Treatment/Interventions: Balance/vestibular training;Community reintegration;Discharge planning;DME/adaptive equipment instruction;Functional mobility training;Neuromuscular re-education;Pain management;Patient/family education;Psychosocial support;Self Care/advanced ADL  retraining;Skin care/wound managment;Splinting/orthotics;Therapeutic Activities;Therapeutic Exercise;UE/LE Strength  taining/ROM;UE/LE Coordination activities;Wheelchair propulsion/positioning OT Recommendation Follow Up Recommendations: Other (comment) (OT: TBD) Equipment Details: No OT DME needed at this time, patient states she has built in shower seat and handicap height toilet seats  Precautions/Restrictions  Precautions Precautions: Fall Restrictions Weight Bearing Restrictions: No  General Chart Reviewed: Yes Family/Caregiver Present: No  Vital Signs Therapy Vitals Temp: 98.7 F (37.1 C) Temp src: Oral Pulse Rate: 82  Resp: 19  BP: 93/58 mmHg Patient Position, if appropriate: Lying Oxygen Therapy SpO2: 94 % O2 Device: None (Room air)  Pain Pain Assessment Pain Assessment: No/denies pain Pain Score: 0-No pain Faces Pain Scale: No hurt   Home Living/Prior Functioning Home Living Lives With: Daughter Available Help at Discharge: Family;Available 24 hours/day Type of Home: House Home Access: Ramped entrance Entrance Stairs-Number of Steps: back entrance ramped and one step to get into kitchen Home Layout: One level Bathroom Shower/Tub: Walk-in shower;Door [employer] Toilet: Handicapped height Bathroom Accessibility: Yes Home Adaptive Equipment: Built-in shower seat;Grab bars in shower;Walker - rolling Additional Comments: Patient has friend with a lot of DME for patient to use at d/c IADL History Homemaking Responsibilities: Yes Occupation: Retired (semi-retired) Type of Occupation: Worked for a [employer] for 13 years; currently has part time job at [place] Level of Independence: Independent with basic ADLs;Independent with homemaking with ambulation;Independent with gait;Independent with transfers Able to Take Stairs?: Yes Driving: Yes Vocation: Retired (semi-retired)  ADL - See FIM  Vision/Perception  Vision - History Baseline Vision: Wears  glasses only for reading Patient Visual Report: No change from baseline Vision - Assessment Eye Alignment: Within Functional Limits Additional Comments: Had cataract surgery a couple years ago per patient report Perception Perception: Within Functional Limits Praxis Praxis: Intact   Cognition Overall Cognitive Status: Appears within functional limits for tasks assessed Arousal/Alertness: Awake/alert Orientation Level: Oriented X4 Memory: Appears intact Awareness: Appears intact Problem Solving: Appears intact Safety/Judgment: Appears intact  Sensation Sensation Light Touch: Appears Intact (bilateral UEs appear intact)  Motor - See Evaluation Navigator  Mobility - See Evaluation Navigator  Trunk/Postural Assessment - See Evaluation Navigator  Balance- See Evaluation Navigator  Extremity/Trunk Assessment RUE Assessment RUE Assessment: Within Functional Limits (can benefit from functional strengthening) LUE Assessment LUE Assessment: Within Functional Limits (can benefit from functional strengthening)  See FIM for current functional status  Refer to Care Plan for Long Term Goals  Recommendations for other services: None  Discharge Criteria: Patient will be discharged from OT if patient refuses treatment 3 consecutive times without medical reason, if treatment goals not met, if there is a change in medical status, if patient makes no progress towards goals or if patient is discharged from hospital.  The above assessment, treatment plan, treatment alternatives and goals were discussed and mutually agreed upon: by patient  ------------------------------------------------------------------------------------------------------------  SESSION NOTES  Session #1 [phone] - 60 Minutes Individual Therapy No complaints of pain  Initial 1:1 occupational therapy evaluation completed. Focused skilled intervention on sit/stands, ADL retraining at sink level (patient washed a  little with clothes donned, "I had a good bath yesterday".), functional ambulation in room using rolling walker, toilet transfer, toileting (clothing management & peri hygiene), grooming tasks seated at sink in w/c, and overall activity tolerance/endurance. At end of session left patient seated in w/c with call bell & phone left within reach.   Session #2 [id number] - 52 Minutes Individual Therapy No complaints of pain Patient found supine in bed. Patient engaged in bed mobility to stand using rolling walker and ambulated  from room -> tub room with close supervision. Patient then engaged in simulated shower stall transfer on/off shower seat with minimal assistance. Discussed and educated patient on safety with shower stall transfer and use of rolling walker. After shower transfer patient ambulated -> ADL apartment for education & practice with bed mobility on a regular bed. Patient stated her biggest limitation right now was her shoes and dynamic standing balance. Patient then ambulated -> therapy gym and worked on [employer] dressing of doffing/donning bilateral socks and donning bilateral shoes for next therapy session. At end of session left patient seated on mat for next therapy session.   Lavontae Cornia 04/18/2012, 8:41 AM

## 2012-04-19 ENCOUNTER — Inpatient Hospital Stay (HOSPITAL_COMMUNITY): Payer: Medicare Other | Admitting: Occupational Therapy

## 2012-04-19 ENCOUNTER — Inpatient Hospital Stay (HOSPITAL_COMMUNITY): Payer: Medicare Other

## 2012-04-19 MED ORDER — POTASSIUM CHLORIDE CRYS ER 20 MEQ PO TBCR
20.0000 meq | EXTENDED_RELEASE_TABLET | Freq: Two times a day (BID) | ORAL | Status: DC
  Administered 2012-04-19 – 2012-04-23 (×9): 20 meq via ORAL
  Filled 2012-04-19 (×13): qty 1

## 2012-04-19 NOTE — Progress Notes (Signed)
Subjective/Complaints: Cough better. Tried to wear shoes yesterday.   A 12 point review of systems has been performed and if not noted above is otherwise negative.  Objective: Vital Signs: Blood pressure 116/73, pulse 90, temperature 97.3 F (36.3 C), temperature source Oral, resp. rate 18, height 5\' 4"  (1.626 m), weight 101.8 kg (224 lb 6.9 oz), SpO2 98.00%. No results found.  Basename 04/18/12 0635 04/17/12 0640  WBC 6.4 8.3  HGB 10.1* 9.9*  HCT 30.3* 30.3*  PLT 229 215    Basename 04/18/12 0635 04/17/12 0640  NA 136 143  K 3.4* 3.3*  CL 100 106  CO2 28 27  GLUCOSE 97 106*  BUN 23 24*  CREATININE 1.69* 1.60*  CALCIUM 8.3* 8.4   CBG (last 3)  No results found for this basename: GLUCAP:3 in the last 72 hours  Wt Readings from Last 3 Encounters:  04/17/12 101.8 kg (224 lb 6.9 oz)  04/16/12 104.191 kg (229 lb 11.2 oz)  04/16/12 104.191 kg (229 lb 11.2 oz)    Physical Exam:  Nursing note and vitals reviewed.  Constitutional: She is oriented to person, place, and time. She appears well-developed and well-nourished.  Obese female  HENT:  Head: Normocephalic and atraumatic.  Mouth/Throat: Oropharynx is clear and moist.  Eyes: Conjunctivae normal and EOM are normal. Pupils are equal, round, and reactive to light.  Neck: Normal range of motion. Neck supple. No JVD present. No tracheal deviation present. No thyromegaly present.  Cardiovascular: Normal rate, regular rhythm and normal heart sounds. Exam reveals no gallop and no friction rub.  No murmur heard.  Pulmonary/Chest: Effort normal and breath sounds normal. No respiratory distress. She has no wheezes.  Abdominal: Soft. Bowel sounds are normal. She exhibits no distension. There is no tenderness. There is no rebound.  Musculoskeletal: She exhibits edema.  2+ pedal edema. Minimal pretibial edema noted.  Neurological: She is alert and oriented to person, place, and time.  Follows commands without difficulty. Paranoia  and confusion has resolved. Speech clear. Strength grossly 4/5 UE and 2-3/5 prox legs to 4/5 distally. No sensory deficits.  Skin: Skin is warm.  MASD under bilateral breast with linear skin tears. Toes on bilateral feet with evidence of ischemia--reddened with crusted drainage. Slightly red between toes R-2nd toe with black eschar on tip. Right lateral forefoot with streaky erythema. Multiple toes with signs of ischemia to varying degrees---no change presently. Feet are sensitive to touch.  Psychiatric: She has a normal mood and affect. Her speech is normal and behavior is normal. Judgment and thought content normal. Her mood appears not anxious. Thought content is not paranoid and not delusional. Cognition and memory are normal.    Assessment/Plan: 1. Functional deficits secondary to deconditioning after septic shock with subsequent metabolic encephalopathy which require 3+ hours per day of interdisciplinary therapy in a comprehensive inpatient rehab setting. Physiatrist is providing close team supervision and 24 hour management of active medical problems listed below. Physiatrist and rehab team continue to assess barriers to discharge/monitor patient progress toward functional and medical goals. FIM: FIM - Bathing Bathing: 0: Activity did not occur (patient washed a little with clotes donned)  FIM - Upper Body Dressing/Undressing Upper body dressing/undressing: 0: Activity did not occur (patient washed a little with clotes donned) FIM - Lower Body Dressing/Undressing Lower body dressing/undressing: 0: Activity did not occur (patient washed a little with clotes donned)  FIM - Toileting Toileting steps completed by patient: Adjust clothing prior to toileting;Performs perineal hygiene;Adjust clothing after toileting  Toileting Assistive Devices: Grab bar or rail for support Toileting: 4: Steadying assist  FIM - Radio producer Devices: Grab bars Toilet Transfers:  4-From toilet/BSC: Min A (steadying Pt. > 75%);4-To toilet/BSC: Min A (steadying Pt. > 75%)  FIM - Bed/Chair Transfer Bed/Chair Transfer Assistive Devices: Arm rests Bed/Chair Transfer: 4: Bed > Chair or W/C: Min A (steadying Pt. > 75%);4: Chair or W/C > Bed: Min A (steadying Pt. > 75%)  FIM - Locomotion: Wheelchair Locomotion: Wheelchair: 1: Total Assistance/staff pushes wheelchair (Pt<25%) (gait primary means of mobility) FIM - Locomotion: Ambulation Ambulation/Gait Assistance: 4: Min assist Locomotion: Ambulation: 2: Travels 50 - 149 ft with minimal assistance (Pt.>75%) (HHA)  Comprehension Comprehension Mode: Auditory Comprehension: 5-Understands complex 90% of the time/Cues < 10% of the time  Expression Expression Mode: Verbal Expression: 5-Expresses complex 90% of the time/cues < 10% of the time  Social Interaction Social Interaction: 5-Interacts appropriately 90% of the time - Needs monitoring or encouragement for participation or interaction.  Problem Solving Problem Solving: 5-Solves basic 90% of the time/requires cueing < 10% of the time  Memory Memory: 5-Recognizes or recalls 90% of the time/requires cueing < 10% of the time  Medical Problem List and Plan:  1. DVT Prophylaxis/Anticoagulation: Pharmaceutical: Lovenox  2. Pain Management: Complains of toes hurting. Support shoes recommended for protection of ischemic toes. Will treat with prn tylenol for now.  3. Mood: motivated. Seems to be dealing with current situation well. LCSW To follow up for formal evaluation.  4. Neuropsych: This patient is capable of making decisions on his/her own behalf.  5. Fluid overload:  monitor weight every other day. Continue low salt diet. Continue lasix daily. Recheck electrolytes tomorrow- push fluid intake a bit. 6. Atrial fibrillation: monitor HR with bid checks. Continue amiodarone.  7. Diarrhea: On probiotics.  8. NSTEMI: Due to demand ischemia. No complaints of CP/dyspnea  9.  Ischemic toes: due to sepsis/shock  -should be able to manage conservatively. See below 10. Hypokalemia: due to diuretics. Increase supplement. Recheck in am.  11. Glaucoma: continue lumigan bid.  12. Candidal infection: difucan completed  -use microguard powder for skin folds if needed  -keep feet dry and clean--i think toes are more showing signs of the ischemia than anything else.  -recommended that she purchase open toed sandals until toes heal  LOS (Days) 2 A FACE TO FACE EVALUATION WAS PERFORMED  [name] T 04/19/2012, 6:54 AM

## 2012-04-19 NOTE — Progress Notes (Signed)
Physical Therapy Session Note  Patient Details  Name: [name] MRN: [id number] Date of Birth: Apr 03, 1946  Today's Date: 04/19/2012 Time: 1305-1345 (40 minutes)   Short Term Goals: Week 1:  PT Short Term Goal 1 (Week 1): = LTGs  Skilled Therapeutic Interventions/Progress Updates:    Finished reviewing [place] HEP using 2# ankle weights for hip abduction, hamstring curls, mini squats, and heel toe raises x 10 reps x 2 sets each bilaterally. On compliant surface worked on balance reactions and strategies with weight shifting anterior/posterior. Gait without AD with steady A (improved step length, widened BOS, and cadence in comparison to yesterday at eval) x 60' x 2. Gait with RW with S to/from therapy for general endurance and strengthening. Toileting back in room with S overall.   Therapy Documentation Precautions:  Precautions Precautions: Fall Restrictions Weight Bearing Restrictions: No    Pain:  Denies pain.  See FIM for current functional status  Therapy/Group: Individual Therapy  Canary Brim [facility] 04/19/2012, 1:42 PM

## 2012-04-19 NOTE — Progress Notes (Signed)
INITIAL ADULT NUTRITION ASSESSMENT Date: 04/19/2012   Time: 12:48 PM  Reason for Assessment: Verbal MD Consult  INTERVENTION: 1. Snacks PO TID 2. RD to continue to follow nutrition care plan  DOCUMENTATION CODES Per approved criteria  -Obesity Unspecified   ASSESSMENT: Female 66 y.o.  Dx: Physical deconditioning  Hx:  Past Medical History  Diagnosis Date  . Hypertension   . Pneumonia   . GERD (gastroesophageal reflux disease)   . Headache   . Glaucoma    Past Surgical History  Procedure Date  . Abdominal hysterectomy   . Cholecystectomy   . Cystoscopy w/ ureteral stent placement 04/04/2012    Procedure: CYSTOSCOPY WITH RETROGRADE PYELOGRAM/URETERAL STENT PLACEMENT;  Surgeon: Ailene Rud, MD;  Location: [facility];  Service: Urology;  Laterality: Left;   Related Meds:     . amiodarone  400 mg Oral Daily  . aspirin EC  81 mg Oral Daily  . bimatoprost  1 drop Both Eyes QHS  . enoxaparin  40 mg Subcutaneous Q24H  . furosemide  40 mg Oral Daily  . loratadine  10 mg Oral Daily  . multivitamin with minerals  1 tablet Oral Daily  . nystatin   Topical BID  . potassium chloride  20 mEq Oral BID  . pseudoephedrine  120 mg Oral Daily  . saccharomyces boulardii  250 mg Oral BID  . DISCONTD: lactose free nutrition  237 mL Oral Q24H  . DISCONTD: potassium chloride  10 mEq Oral BID   Ht: 5\' 4"  (162.6 cm)  Wt: 224 lb 6.9 oz (101.8 kg)  Ideal Wt: 120 lb % Ideal Wt: 54.5 kg  Wt Readings from Last 15 Encounters:  04/17/12 224 lb 6.9 oz (101.8 kg)  04/16/12 229 lb 11.2 oz (104.191 kg)  04/16/12 229 lb 11.2 oz (104.191 kg)  Usual Wt: 230 lb % Usual Wt: 97%  Body mass index is 38.52 kg/(m^2). Obese Class II  Food/Nutrition Related Hx: pt with poor PO intake during acute hospitalization  Labs:  CMP     Component Value Date/Time   NA 136 04/18/2012 0635   K 3.4* 04/18/2012 0635   CL 100 04/18/2012 0635   CO2 28 04/18/2012 0635   GLUCOSE 97 04/18/2012 0635   BUN 23 04/18/2012 0635   CREATININE 1.69* 04/18/2012 0635   CALCIUM 8.3* 04/18/2012 0635   PROT 5.4* 04/18/2012 0635   ALBUMIN 2.2* 04/18/2012 0635   AST 20 04/18/2012 0635   ALT 23 04/18/2012 0635   ALKPHOS 106 04/18/2012 0635   BILITOT 0.3 04/18/2012 0635   GFRNONAA 30* 04/18/2012 0635   GFRAA 35* 04/18/2012 0635    Intake/Output Summary (Last 24 hours) at 04/19/12 1250 Last data filed at 04/19/12 0900  Gross per 24 hour  Intake    720 ml  Output      0 ml  Net    720 ml   Diet Order: Cardiac  Supplements/Tube Feeding: none  IVF:    Estimated Nutritional Needs:   Kcal: 1800 - 2000 kcal Protein:  95- 110 grams Fluid:  1.8 - 2 liters daily  Admitted after acute hospitalization r/t sepsis and encephalopathy. Followed by RD staff during acute hospitalization. Pt advanced to diet s/p FEES on 10/25. States that her appetite is improving daily. Initially ordered for Boost however pt has been declining it. Intake >75%. PA requesting snacks for patient. Reviewed snack options with pt, will set up.  Unstageable wounds to foot, per chart. Pt is at nutrition risk given  skin breakdown and medical combordities.  NUTRITION DIAGNOSIS: Increased nutrient needs r/t wound healing AEB estimated needs.  MONITORING/EVALUATION(Goals): Goal: Pt to meet >/= 90% of their estimated nutrition needs Monitor: weight trends, lab trends, I/O's, PO intake, supplement tolerance  EDUCATION NEEDS: -No education needs identified at this time  Inda Coke MS, RD, LDN Pager: [phone] After-hours pager: [phone]

## 2012-04-19 NOTE — Progress Notes (Signed)
Occupational Therapy Session Notes  Patient Details  Name: [name] MRN: [id number] Date of Birth: [birth year]  Today's Date: 04/19/2012  Short Term Goals: Week 1:  OT Short Term Goal 1 (Week 1): Short Term Goals = Long Term Goals  Skilled Therapeutic Interventions/Progress Updates:   Session #1 [phone] - 60 Minutes Individual Therapy No complaints of pain, just complaints of being tired secondary to "not much sleep last night".  Patient found supine in bed. Engaged in bed mobility for edge of bed -> rolling walker for functional ambulation throughout room to gather necessary items for ADL. Patient then ambulated into bathroom and transferred onto [facility] seated over toilet seat and performed toileting tasks with supervision. Then patient engaged in ADL retraining at shower level. Skilled intervention focusing on functional ambulation throughout room using rolling walker, safety with use of rolling walker, overall activity tolerance/endurance, UB/LB bathing & dressing, sit/stands, dynamic standing balance/tolerance/endurance, grooming tasks at sink, and discussed energy conservation techniques & importance. At end of session left patient seated in w/c with call bell & phone left within reach. Goals upgraded to an overall modified independent level.   Session #2 [id number] - 45 Minutes Individual Therapy No complaints of pain Patient found supine in bed. Patient ambulated from room -> ADL apartment. Treatment focus on housekeeping tasks of stripping bed in ADL apartment and donning new sheets and comforter. Patient completed task without use of rolling walker, but used bed as UE support/stabilizer during task. Patient with decreased endurance during task and had to take ~ 4 seated rest breaks. After IADL task patient ambulated -> therapy gym for therapeutic exercise focusing on dynamic standing balance/tolerance/endurance, hand/eye coordination, and overall endurance & strength. Patient  ambulated back to room and engaged in toilet transfer and toileting. Therapist left patient seated in bed with call bell & phone within reach.   Precautions:  Precautions Precautions: Fall Restrictions Weight Bearing Restrictions: No  See FIM for current functional status  [name] 04/19/2012, 7:41 AM

## 2012-04-19 NOTE — Progress Notes (Signed)
Physical Therapy Session Note  Patient Details  Name: RAHNIYA GUAN MRN: [id number] Date of Birth: 1945/09/02  Today's Date: 04/19/2012 Time: [id number] Time Calculation (min): 45 min  Short Term Goals: Week 1:  PT Short Term Goal 1 (Week 1): = LTGs  Skilled Therapeutic Interventions/Progress Updates:    Treatment focused on household gait and mobility using RW to collect clothing items on floor left from shower earlier session and carrying items in room with overall S (steady A when bending down to floor), simulated car transfer from sedan height, and gait on unit for endurance and strengthening. Introduced [place] for strengthening and balance (will finish in afternoon session) and handout given.   Therapy Documentation Precautions:  Precautions Precautions: Fall Restrictions Weight Bearing Restrictions: No   Pain:  No complaints.   Locomotion : Ambulation Ambulation/Gait Assistance: 5: Supervision   See FIM for current functional status  Therapy/Group: Individual Therapy  Canary Brim [facility] 04/19/2012, 10:38 AM

## 2012-04-20 ENCOUNTER — Inpatient Hospital Stay (HOSPITAL_COMMUNITY): Payer: Medicare Other

## 2012-04-20 ENCOUNTER — Inpatient Hospital Stay (HOSPITAL_COMMUNITY): Payer: Medicare Other | Admitting: Physical Therapy

## 2012-04-20 ENCOUNTER — Inpatient Hospital Stay (HOSPITAL_COMMUNITY): Payer: Medicare Other | Admitting: Occupational Therapy

## 2012-04-20 DIAGNOSIS — R5381 Other malaise: Secondary | ICD-10-CM

## 2012-04-20 DIAGNOSIS — A4189 Other specified sepsis: Secondary | ICD-10-CM

## 2012-04-20 DIAGNOSIS — G9341 Metabolic encephalopathy: Secondary | ICD-10-CM

## 2012-04-20 LAB — BASIC METABOLIC PANEL
BUN: 21 mg/dL (ref 6–23)
CO2: 32 mEq/L (ref 19–32)
Calcium: 8.8 mg/dL (ref 8.4–10.5)
Chloride: 99 mEq/L (ref 96–112)
Creatinine, Ser: 1.82 mg/dL — ABNORMAL HIGH (ref 0.50–1.10)
GFR calc Af Amer: 32 mL/min — ABNORMAL LOW (ref >90)
GFR calc non Af Amer: 28 mL/min — ABNORMAL LOW (ref >90)
Glucose, Bld: 101 mg/dL — ABNORMAL HIGH (ref 70–99)
Potassium: 3.8 mEq/L (ref 3.5–5.1)
Sodium: 140 mEq/L (ref 135–145)

## 2012-04-20 NOTE — Progress Notes (Signed)
Physical Therapy Session Note  Patient Details  Name: [name] MRN: [id number] Date of Birth: 09-27-1945  Today's Date: 04/20/2012 Time: 0815-0900 Time Calculation (min): 45 min  Short Term Goals: Week 1:  PT Short Term Goal 1 (Week 1): = LTGs  Skilled Therapeutic Interventions/Progress Updates:    Bathroom mobility with RW, supervision. Ambulation 2 x 150' with RW. Curb and ramp x 2 reps with RW and supervision, cues needed for stopping prior to moving RW up/down curb. Horseshoe pick up at various levels down to floor working on safety with balance and RW management. NuStep level 7; 2 x 5 min shooting for >40 steps per min. RPE 13. Gait x 100' without RW, min assist decreased gait speed and impaired lateral stability noted as compared to with RW. Floor transfer with min assist, would benefit from practice as she had difficulty remembering sequence even after demonstration.   Therapy Documentation Precautions:  Precautions Precautions: Fall Restrictions Weight Bearing Restrictions: No Pain: Pain Assessment Pain Assessment: No/denies pain  See FIM for current functional status  Therapy/Group: Individual Therapy  Lahoma Rocker 04/20/2012, 9:08 AM

## 2012-04-20 NOTE — Progress Notes (Signed)
Physical Therapy Session Note  Patient Details  Name: [name] MRN: [id number] Date of Birth: 03-Apr-1946  Today's Date: 04/20/2012 Time: 1015-1055 Time Calculation (min): 40 min  Short Term Goals: Week 1:  PT Short Term Goal 1 (Week 1): = LTGs  Skilled Therapeutic Interventions/Progress Updates:    Focus on dynamic gait to simulate household environment and obstacle negotiation for community mobility including turns, side stepping, stepping over various sized items, and stairs. Initially started with RW, no AD with intermittent steady A (stepping over items), and progressed to quad cane. Focused rest of session on use of quad cane for gait with cueing for gait pattern and using cane on steps (tends to do step without having cane supported on ground). Recommended she use quad cane with nursing staff in the room and with therapy sessions (or no AD) to challenge balance. Anticipate she will benefit from RW in community setting for energy conservation at this time.   Therapy Documentation Precautions:  Precautions Precautions: Fall Restrictions Weight Bearing Restrictions: No Pain: Pain Assessment Pain Assessment: No/denies pain   Locomotion : Ambulation Ambulation/Gait Assistance: 5: Supervision   See FIM for current functional status  Therapy/Group: Individual Therapy  Canary Brim [facility] 04/20/2012, 10:57 AM

## 2012-04-20 NOTE — Progress Notes (Signed)
Occupational Therapy Session Note  Patient Details  Name: SHERRAY SWICKARD MRN: [id number] Date of Birth: January 11, 1946  Today's Date: 04/20/2012 Time: 0915-1000 Time Calculation (min): 45 min  Short Term Goals: Week 1:  OT Short Term Goal 1 (Week 1): Short Term Goals = Long Term Goals  Skilled Therapeutic Interventions/Progress Updates:  Patient found supine in bed with bed alarm set. Patient engaged in bed mobility for sit->stand with rolling walker and functional mobility throughout room and bathroom in order to perform ADL at shower level. Patient able to perform ADL with set-up assistance. Focused skilled intervention on UB/LB bathing at shower level in sit-> stand position, UB/LB dressing in sit-> stand position, overall activity tolerance/endurance, functional mobility with use of rolling walker, cleaning up after ADL(bending over to pick items off ground), and grooming tasks standing at sink. At end of session, left patient supine in bed with bed alarm set and call bell & phone within reach.   Precautions:  Precautions Precautions: Fall Restrictions Weight Bearing Restrictions: No  See FIM for current functional status  Therapy/Group: Individual Therapy  Dannon Nguyenthi 04/20/2012, 10:03 AM

## 2012-04-20 NOTE — Progress Notes (Signed)
Physical Therapy Session Note  Patient Details  Name: [name] MRN: [id number] Date of Birth: 04/22/1946  Today's Date: 04/20/2012 Time: 1400-1500 Time Calculation (min): 60 min  Short Term Goals: Week 1:  PT Short Term Goal 1 (Week 1): = LTGs  Skilled Therapeutic Interventions/Progress Updates:   Gait with quad cane to therapy gym with S, cues for gait pattern intermittently. Dynamic standing balance activities without UE support for Wii Fit games and Lovilia. Seated rest breaks as needed. Gait back to room without AD and holding a cup (also stopped at nurse's station to fill up cup with ice and water to carry back to room) with close S. As pt fatigues, pt with increased lateral weight shifts and decreased stability, requiring min A and returned to using cane for support with S.  Therapy Documentation Precautions:  Precautions Precautions: Fall Restrictions Weight Bearing Restrictions: No  Pain:  no complaints  See FIM for current functional status  Therapy/Group: Individual Therapy  Canary Brim [facility] 04/20/2012, 4:22 PM

## 2012-04-20 NOTE — Progress Notes (Signed)
Social Work  Social Work Assessment and Plan  Patient Details  Name: [name] MRN: [id number] Date of Birth: 1945/07/17  Today's Date: 04/20/2012  Problem List:  Patient Active Problem List  Diagnosis  . Septic shock  . Pyelonephritis  . Hypoxemia  . Encephalopathy  . Nephrolithiasis  . AKI (acute kidney injury)  . Glaucoma  . Physical deconditioning   Past Medical History:  Past Medical History  Diagnosis Date  . Hypertension   . Pneumonia   . GERD (gastroesophageal reflux disease)   . Headache   . Glaucoma    Past Surgical History:  Past Surgical History  Procedure Date  . Abdominal hysterectomy   . Cholecystectomy   . Cystoscopy w/ ureteral stent placement 04/04/2012    Procedure: CYSTOSCOPY WITH RETROGRADE PYELOGRAM/URETERAL STENT PLACEMENT;  Surgeon: Ailene Rud, MD;  Location: [place];  Service: Urology;  Laterality: Left;   Social History:  reports that she has never smoked. She does not have any smokeless tobacco history on file. She reports that she does not drink alcohol or use illicit drugs.  Family / Support Systems Marital Status: Widow/Widower How Long?: 8 years Patient Roles: Parent;Other (Comment) (works full time) Children: daughter, Onya Orefice @ [phone] or (C) [phone] - lives with patient and not working (chronic migraines) Anticipated Caregiver: Daughter Ability/Limitations of Caregiver: None Caregiver Availability: 24/7 Family Dynamics: Pt describes daughter as extremely supportive and willing to assist  Social History Preferred language: English Religion: Methodist Cultural Background: NA Education: college Read: Yes Write: Yes Employment Status: Employed 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  Recent Psychosocial Issues: none Pyschiatric History: none Substance Abuse History: none  Patient / Family Perceptions, Expectations & Goals Pt/Family understanding of illness & functional limitations: Pt with basic understanding of medical issues and of her current functional limitations. Premorbid pt/family roles/activities: living independently and working full-time Anticipated changes in roles/activities/participation: daughter may need to provide some caregiver support initially until endurance/ strenth improve. Pt/family expectations/goals: "I want ot build my strength up"  [employer]: None Premorbid Home Care/DME Agencies: None Transportation available at discharge: yes  Discharge Planning Living Arrangements: [place]: Children;Friends/neighbors;Church/faith community Type of Residence: Private residence Insurance Resources: [employer] Financial Resources: Social Neurosurgeon Screen Referred: No Living Expenses: Own Money Management: Patient Do you have any problems obtaining your medications?: No Home Management: pt Patient/Family Preliminary Plans: Plans to d/c home with daughter to assist as needed. Social Work Anticipated Follow Up Needs: HH/OP Expected length of stay: 7-10 days  Clinical Impression Pleasant, oriented woman here after sepsis, deconditioning - anticipate short LOS and good gains.  No significant emotional distress noted, however, will continue to monitor.  Siana Panameno 04/20/2012, 2:45 PM

## 2012-04-20 NOTE — Progress Notes (Signed)
Subjective/Complaints: Had a good day with therapies yesterday. No complaints this am.   A 12 point review of systems has been performed and if not noted above is otherwise negative.  Objective: Vital Signs: Blood pressure 104/66, pulse 85, temperature 98 F (36.7 C), temperature source Oral, resp. rate 18, height 5\' 4"  (1.626 m), weight 100.9 kg (222 lb 7.1 oz), SpO2 92.00%. No results found.  Basename 04/18/12 0635  WBC 6.4  HGB 10.1*  HCT 30.3*  PLT 229    Basename 04/18/12 0635  NA 136  K 3.4*  CL 100  CO2 28  GLUCOSE 97  BUN 23  CREATININE 1.69*  CALCIUM 8.3*   CBG (last 3)  No results found for this basename: GLUCAP:3 in the last 72 hours  Wt Readings from Last 3 Encounters:  04/20/12 100.9 kg (222 lb 7.1 oz)  04/16/12 104.191 kg (229 lb 11.2 oz)  04/16/12 104.191 kg (229 lb 11.2 oz)    Physical Exam:  Nursing note and vitals reviewed.  Constitutional: She is oriented to person, place, and time. She appears well-developed and well-nourished.  Obese female  HENT:  Head: Normocephalic and atraumatic.  Mouth/Throat: Oropharynx is clear and moist.  Eyes: Conjunctivae normal and EOM are normal. Pupils are equal, round, and reactive to light.  Neck: Normal range of motion. Neck supple. No JVD present. No tracheal deviation present. No thyromegaly present.  Cardiovascular: Normal rate, regular rhythm and normal heart sounds. Exam reveals no gallop and no friction rub.  No murmur heard.  Pulmonary/Chest: Effort normal and breath sounds normal. No respiratory distress. She has no wheezes.  Abdominal: Soft. Bowel sounds are normal. She exhibits no distension. There is no tenderness. There is no rebound.  Musculoskeletal: She exhibits edema.  2+ pedal edema. Minimal pretibial edema noted.  Neurological: She is alert and oriented to person, place, and time.  Follows commands without difficulty. Paranoia and confusion has resolved. Speech clear. Strength grossly 4/5 UE  and 2-3/5 prox legs to 4/5 distally. No sensory deficits.  Skin: Skin is warm.  MASD under bilateral breast with linear skin tears. Toes on bilateral feet with evidence of ischemia--reddened with crusted drainage. Slightly red between toes R-2nd toe with black eschar on tip. Right lateral forefoot with streaky erythema. Multiple toes with signs of ischemia to varying degrees---no change presently. Feet are sensitive to touch.  Psychiatric: She has a normal mood and affect. Her speech is normal and behavior is normal. Judgment and thought content normal. Her mood appears not anxious. Thought content is not paranoid and not delusional. Cognition and memory are normal.    Assessment/Plan: 1. Functional deficits secondary to deconditioning after septic shock with subsequent metabolic encephalopathy which require 3+ hours per day of interdisciplinary therapy in a comprehensive inpatient rehab setting. Physiatrist is providing close team supervision and 24 hour management of active medical problems listed below. Physiatrist and rehab team continue to assess barriers to discharge/monitor patient progress toward functional and medical goals. FIM: FIM - Bathing Bathing Steps Patient Completed: Chest;Right Arm;Left Arm;Front perineal area;Abdomen;Buttocks;Right upper leg;Left upper leg;Right lower leg (including foot);Left lower leg (including foot) Bathing: 5: Supervision: Safety issues/verbal cues  FIM - Upper Body Dressing/Undressing Upper body dressing/undressing steps patient completed: Thread/unthread right bra strap;Thread/unthread left bra strap;Hook/unhook bra;Thread/unthread right sleeve of pullover shirt/dresss;Thread/unthread left sleeve of pullover shirt/dress;Put head through opening of pull over shirt/dress;Pull shirt over trunk Upper body dressing/undressing: 5: Supervision: Safety issues/verbal cues FIM - Lower Body Dressing/Undressing Lower body dressing/undressing steps patient  completed:  Thread/unthread left pants leg;Thread/unthread right underwear leg;Thread/unthread left underwear leg;Pull underwear up/down;Thread/unthread right pants leg;Pull pants up/down;Don/Doff right sock;Don/Doff left sock;Don/Doff right shoe;Don/Doff left shoe;Fasten/unfasten right shoe;Fasten/unfasten left shoe Lower body dressing/undressing: 5: Supervision: Safety issues/verbal cues  FIM - Toileting Toileting steps completed by patient: Adjust clothing prior to toileting;Performs perineal hygiene;Adjust clothing after toileting Toileting Assistive Devices: Grab bar or rail for support Toileting: 5: Supervision: Safety issues/verbal cues  FIM - Radio producer Devices: Bedside commode;Walker;Grab bars Toilet Transfers: 5-To toilet/BSC: Supervision (verbal cues/safety issues)  FIM - Control and instrumentation engineer Devices: Copy: 5: Chair or W/C > Bed: Supervision (verbal cues/safety issues);5: Bed > Chair or W/C: Supervision (verbal cues/safety issues);5: Sit > Supine: Supervision (verbal cues/safety issues);5: Supine > Sit: Supervision (verbal cues/safety issues)  FIM - Locomotion: Wheelchair Locomotion: Wheelchair: 0: Activity did not occur FIM - Locomotion: Ambulation Locomotion: Ambulation Assistive Devices: Administrator Ambulation/Gait Assistance: 5: Supervision Locomotion: Ambulation: 5: Travels 150 ft or more with supervision/safety issues  Comprehension Comprehension Mode: Auditory Comprehension: 6-Follows complex conversation/direction: With extra time/assistive device  Expression Expression Mode: Verbal Expression: 6-Expresses complex ideas: With extra time/assistive device  Social Interaction Social Interaction: 6-Interacts appropriately with others with medication or extra time (anti-anxiety, antidepressant).  Problem Solving Problem Solving: 6-Solves complex problems: With extra time  Memory Memory:  6-More than reasonable amt of time  Medical Problem List and Plan:  1. DVT Prophylaxis/Anticoagulation: Pharmaceutical: Lovenox  2. Pain Management: Complains of toes hurting. Support shoes recommended for protection of ischemic toes. Will treat with prn tylenol for now.  3. Mood: motivated. Seems to be dealing with current situation well. LCSW To follow up for formal evaluation.  4. Neuropsych: This patient is capable of making decisions on his/her own behalf.  5. Fluid overload:  monitor weight every other day. Continue low salt diet. Continue lasix daily. Recheck electrolytes tomorrow- push fluid intake a bit.  -overall weights and I's and O's are balanced. 6. Atrial fibrillation: monitor HR with bid checks. Continue amiodarone.  7. Diarrhea: On probiotics.  8. NSTEMI: Due to demand ischemia. No complaints of CP/dyspnea  9. Ischemic toes: due to sepsis/shock  -should be able to manage conservatively. See below 10. Hypokalemia: due to diuretics. Increase supplement.   -following serially  11. Glaucoma: continue lumigan bid.  12. Candidal infection: difucan completed  -use microguard powder for skin folds if needed  -keep feet dry and clean--i think toes are more showing signs of the ischemia than anything else.  -recommended that she purchase open toed sandals until toes heal  LOS (Days) 3 A FACE TO FACE EVALUATION WAS PERFORMED  SWARTZ,ZACHARY T 04/20/2012, 7:14 AM

## 2012-04-21 ENCOUNTER — Inpatient Hospital Stay (HOSPITAL_COMMUNITY): Payer: Medicare Other | Admitting: Occupational Therapy

## 2012-04-21 ENCOUNTER — Inpatient Hospital Stay (HOSPITAL_COMMUNITY): Payer: Medicare Other | Admitting: Physical Therapy

## 2012-04-21 ENCOUNTER — Inpatient Hospital Stay (HOSPITAL_COMMUNITY): Payer: Medicare Other

## 2012-04-21 NOTE — Progress Notes (Signed)
Physical Therapy Note  Patient Details  Name: REGINE GERMER MRN: [id number] Date of Birth: August 08, 1945 Today's Date: 04/21/2012  1100-1155 (55 minutes) group Pain : no complaint of pain Pt participated in PT group session focused on gait training/safety/endurance. Pt ambulates X 3  (80 -120 feet) using SBQC min to close SBA with mod vcs for sequencing AD.   Martel Galvan,JIM 04/21/2012, 7:59 AM

## 2012-04-21 NOTE — Progress Notes (Signed)
Poor safety awareness, can be impulsive at times. Pt observed attempting to ambulate to dresser to put away articles of clothing. Provided education regarding safety. Reinforced safety plan, and bed alarm activated. Documentation reflects that pt's LBM was 04/17/12. Pt insists that she's had daily bowel movement since hospitalization, refusing any interventions at this time.

## 2012-04-21 NOTE — Progress Notes (Addendum)
Physical Therapy Session Note  Patient Details  Name: [name] MRN: [id number] Date of Birth: 07/31/1945  Today's Date: 04/21/2012 Time: 0930-1030 Time Calculation (min): 60 min  Short Term Goals: Week 1:  PT Short Term Goal 1 (Week 1): = LTGs  Therapy Documentation Precautions:  Precautions Precautions: Fall Restrictions Weight Bearing Restrictions: No Pain: No c/o pain  Gait Training:(15') Using RW 1 x 150' and single point cane 2 x 150' Therapeutic Activity:(15') Transfer Training with S/Mod-I Therapeutic Exercise:(30') Nu-Step, level 3,  x 10' with 2 rest breaks x 2 minutes:  B LE's in sitting and standing  See FIM for current functional status  Therapy/Group: Individual Therapy  [name] 04/21/2012, 9:40 AM

## 2012-04-21 NOTE — Progress Notes (Addendum)
Occupational Therapy Session Note  Patient Details  Name: [name] MRN: [id number] Date of Birth: 1946-06-13  Today's Date: 04/21/2012 Time: 0800-0855 Time Calculation (min): 55 min  Short Term Goals: Week 1:  OT Short Term Goal 1 (Week 1): Short Term Goals = Long Term Goals  Skilled Therapeutic Interventions/Progress Updates:  Patient found seated edge of bed upon entering room. Patient engaged in sit-> stand with quad cane and functional ambulation throughout room using quad cane with supervision. Patient then performed ADL retraining at shower level at modified independent level. Focused skilled intervention on ADL retraining of bathing, dressing, & grooming tasks, functional mobility/ambulation throughout room & bathroom, overall activity tolerance/endurance, and dynamic standing balance/tolerance/endurance. After ADL therapist educated patient on UE HEP using therband. Recommend patient complete HEP 2-3 times per day and complete 3 sets of 10 repetitions. At end of session left patient seated edge of bed with call bell & phone left within reach.   Precautions:  Precautions Precautions: Fall Restrictions Weight Bearing Restrictions: No  See FIM for current functional status  Therapy/Group: Individual Therapy  Abdias Hickam 04/21/2012, 9:00 AM

## 2012-04-21 NOTE — Plan of Care (Signed)
Problem: RH SAFETY Goal: RH STG ADHERE TO SAFETY PRECAUTIONS W/ASSISTANCE/DEVICE STG Adhere to Safety Precautions With supervision  Impulsive at times. Requires bed alarm and reminders for safety

## 2012-04-21 NOTE — Progress Notes (Signed)
Subjective/Complaints: Had a good day with therapies yesterday. No complaints this am. Appetite improving.  A 12 point review of systems has been performed and if not noted above is otherwise negative.  Objective: Vital Signs: Blood pressure 96/61, pulse 77, temperature 97.8 F (36.6 C), temperature source Oral, resp. rate 17, height 5\' 4"  (1.626 m), weight 96.4 kg (212 lb 8.4 oz), SpO2 93.00%. No results found. No results found for this basename: WBC:2,HGB:2,HCT:2,PLT:2 in the last 72 hours  Basename 04/20/12 0643  NA 140  K 3.8  CL 99  CO2 32  GLUCOSE 101*  BUN 21  CREATININE 1.82*  CALCIUM 8.8   CBG (last 3)  No results found for this basename: GLUCAP:3 in the last 72 hours  Wt Readings from Last 3 Encounters:  04/21/12 96.4 kg (212 lb 8.4 oz)  04/16/12 104.191 kg (229 lb 11.2 oz)  04/16/12 104.191 kg (229 lb 11.2 oz)    Physical Exam:  Nursing note and vitals reviewed.  Constitutional: She is oriented to person, place, and time. She appears well-developed and well-nourished.  Obese female  HENT:  Head: Normocephalic and atraumatic.  Mouth/Throat: Oropharynx is clear and moist.  Eyes: Conjunctivae normal and EOM are normal. Pupils are equal, round, and reactive to light.  Neck: Normal range of motion. Neck supple. No JVD present. No tracheal deviation present. No thyromegaly present.  Cardiovascular: Normal rate, regular rhythm and normal heart sounds. Exam reveals no gallop and no friction rub.  No murmur heard.  Pulmonary/Chest: Effort normal and breath sounds normal. No respiratory distress. She has no wheezes.  Abdominal: Soft. Bowel sounds are normal. She exhibits no distension. There is no tenderness. There is no rebound.  Musculoskeletal: She exhibits edema.  2+ pedal edema. Minimal pretibial edema noted.  Neurological: She is alert and oriented to person, place, and time.  Follows commands without difficulty. Paranoia and confusion has resolved. Speech clear.  Strength grossly 4/5 UE and 2-3/5 prox legs to 4/5 distally. No sensory deficits.  Skin: Skin is warm.  MASD under bilateral breast with linear skin tears. Toes on bilateral feet with evidence of ischemia--reddened with crusted drainage. Slightly red between toes R-2nd toe with black eschar on tip. Right lateral forefoot with streaky erythema. Multiple toes with signs of ischemia to varying degrees---no change presently. Feet are sensitive to touch.  Psychiatric: She has a normal mood and affect. Her speech is normal and behavior is normal. Judgment and thought content normal. Her mood appears not anxious. Thought content is not paranoid and not delusional. Cognition and memory are normal.    Assessment/Plan: 1. Functional deficits secondary to deconditioning after septic shock with subsequent metabolic encephalopathy which require 3+ hours per day of interdisciplinary therapy in a comprehensive inpatient rehab setting. Physiatrist is providing close team supervision and 24 hour management of active medical problems listed below. Physiatrist and rehab team continue to assess barriers to discharge/monitor patient progress toward functional and medical goals. FIM: FIM - Bathing Bathing Steps Patient Completed: Chest;Right Arm;Left Arm;Abdomen;Front perineal area;Buttocks;Right upper leg;Left upper leg;Right lower leg (including foot);Left lower leg (including foot) Bathing: 6: Assistive device (Comment)  FIM - Upper Body Dressing/Undressing Upper body dressing/undressing steps patient completed: Thread/unthread right bra strap;Thread/unthread left bra strap;Hook/unhook bra;Thread/unthread right sleeve of pullover shirt/dresss;Thread/unthread left sleeve of pullover shirt/dress;Put head through opening of pull over shirt/dress;Pull shirt over trunk Upper body dressing/undressing: 7: Complete Independence: No helper FIM - Lower Body Dressing/Undressing Lower body dressing/undressing steps patient  completed: Thread/unthread right underwear leg;Thread/unthread  left underwear leg;Pull underwear up/down;Thread/unthread right pants leg;Thread/unthread left pants leg;Pull pants up/down;Don/Doff right sock;Don/Doff left sock;Don/Doff right shoe;Don/Doff left shoe Lower body dressing/undressing: 6: Assistive device (Comment)  FIM - Toileting Toileting steps completed by patient: Performs perineal hygiene;Adjust clothing prior to toileting;Adjust clothing after toileting Toileting Assistive Devices: Grab bar or rail for support Toileting: 6: Assistive device: No helper  FIM - Radio producer Devices: Environmental consultant;Bedside commode;Grab bars Toilet Transfers: 6-Assistive device: No helper  FIM - Bed/Chair Transfer Bed/Chair Transfer Assistive Devices: Copy: 5: Supine > Sit: Supervision (verbal cues/safety issues);5: Sit > Supine: Supervision (verbal cues/safety issues);5: Bed > Chair or W/C: Supervision (verbal cues/safety issues);5: Chair or W/C > Bed: Supervision (verbal cues/safety issues)  FIM - Locomotion: Wheelchair Locomotion: Wheelchair: 0: Activity did not occur FIM - Locomotion: Ambulation Locomotion: Ambulation Assistive Devices: Nurse, adult Ambulation/Gait Assistance: 5: Supervision Locomotion: Ambulation: 5: Travels 150 ft or more with supervision/safety issues  Comprehension Comprehension Mode: Auditory Comprehension: 6-Follows complex conversation/direction: With extra time/assistive device  Expression Expression Mode: Verbal Expression: 6-Expresses complex ideas: With extra time/assistive device  Social Interaction Social Interaction: 6-Interacts appropriately with others with medication or extra time (anti-anxiety, antidepressant).  Problem Solving Problem Solving: 6-Solves complex problems: With extra time  Memory Memory: 6-More than reasonable amt of time  Medical Problem List and Plan:  1. DVT  Prophylaxis/Anticoagulation: Pharmaceutical: Lovenox  2. Pain Management: Complains of toes hurting. Support shoes recommended for protection of ischemic toes. Will treat with prn tylenol for now.  3. Mood: motivated. Seems to be dealing with current situation well. LCSW To follow up for formal evaluation.  4. Neuropsych: This patient is capable of making decisions on his/her own behalf.  5. Fluid overload:  monitor weight every other day. Continue low salt diet. Continue lasix daily. Recheck electrolytes tomorrow- push fluid intake a bit.  -overall weights and I's and O's are balanced.  -need to be consistent with weights 6. Atrial fibrillation: monitor HR with bid checks. Continue amiodarone.  7. Diarrhea: On probiotics.  8. NSTEMI: Due to demand ischemia. No complaints of CP/dyspnea  9. Ischemic toes: due to sepsis/shock  -should be able to manage conservatively. See below 10. Hypokalemia: due to diuretics. Increase supplement.   -following serially  11. Glaucoma: continue lumigan bid.  12. Candidal infection: difucan completed  -use microguard powder for skin folds if needed  -keep feet dry and clean--toes are improving daily. .  -recommended that she purchase open toed sandals until toes heal  LOS (Days) 4 A FACE TO FACE EVALUATION WAS PERFORMED  [name] T 04/21/2012, 7:45 AM

## 2012-04-22 ENCOUNTER — Inpatient Hospital Stay (HOSPITAL_COMMUNITY): Payer: Medicare Other | Admitting: *Deleted

## 2012-04-22 NOTE — Progress Notes (Signed)
Physical Therapy Note  Patient Details  Name: [name] MRN: [id number] Date of Birth: 1946/01/08 Today's Date: 04/22/2012  1400-1455 (55 minutes) group Pain: no complaint of pain Pt participated in PT group session focused on gait training/safety/ endurance. Pt ambulates on unit 160 feet X 3 with LBQC close SBA with mod vcs for sequencing ; up/down 2-3 steps with one rail (step to step) and LBQC close SBA + vcs for placement of AD on step.   Zena Vitelli,JIM 04/22/2012, 7:57 AM

## 2012-04-22 NOTE — Progress Notes (Signed)
Subjective/Complaints: Had a good day with therapies yesterday. No complaints this am. Questions about rash on legs  A 12 point review of systems has been performed and if not noted above is otherwise negative.  Objective: Vital Signs: Blood pressure 107/72, pulse 86, temperature 97.8 F (36.6 C), temperature source Oral, resp. rate 17, height 5\' 4"  (1.626 m), weight 93.2 kg (205 lb 7.5 oz), SpO2 94.00%. No results found. No results found for this basename: WBC:2,HGB:2,HCT:2,PLT:2 in the last 72 hours  Basename 04/20/12 0643  NA 140  K 3.8  CL 99  CO2 32  GLUCOSE 101*  BUN 21  CREATININE 1.82*  CALCIUM 8.8   CBG (last 3)  No results found for this basename: GLUCAP:3 in the last 72 hours  Wt Readings from Last 3 Encounters:  04/22/12 93.2 kg (205 lb 7.5 oz)  04/16/12 104.191 kg (229 lb 11.2 oz)  04/16/12 104.191 kg (229 lb 11.2 oz)    Physical Exam:  Nursing note and vitals reviewed.  Constitutional: She is oriented to person, place, and time. She appears well-developed and well-nourished.  Obese female  HENT:  Head: Normocephalic and atraumatic.  Mouth/Throat: Oropharynx is clear and moist.  Eyes: Conjunctivae normal and EOM are normal. Pupils are equal, round, and reactive to light.  Neck: Normal range of motion. Neck supple. No JVD present. No tracheal deviation present. No thyromegaly present.  Cardiovascular: Normal rate, regular rhythm and normal heart sounds. Exam reveals no gallop and no friction rub.  No murmur heard.  Pulmonary/Chest: Effort normal and breath sounds normal. No respiratory distress. She has no wheezes.  Abdominal: Soft. Bowel sounds are normal. She exhibits no distension. There is no tenderness. There is no rebound.  Musculoskeletal: She exhibits edema.  2+ pedal edema. Minimal pretibial edema noted.  Neurological: She is alert and oriented to person, place, and time.  Follows commands without difficulty. Paranoia and confusion has resolved.  Speech clear. Strength grossly 4/5 UE and 2-3/5 prox legs to 4/5 distally. No sensory deficits.  Skin: Skin is warm. Papular rash on both legs MASD under bilateral breast with linear skin tears. Toes on bilateral feet with evidence of ischemia--reddened with crusted drainage. Slightly red between toes R-2nd toe with black eschar on tip. Right lateral forefoot with streaky erythema. Multiple toes with signs of ischemia to varying degrees---no change presently. Feet are sensitive to touch.  Psychiatric: She has a normal mood and affect. Her speech is normal and behavior is normal. Judgment and thought content normal. Her mood appears not anxious. Thought content is not paranoid and not delusional. Cognition and memory are normal.    Assessment/Plan: 1. Functional deficits secondary to deconditioning after septic shock with subsequent metabolic encephalopathy which require 3+ hours per day of interdisciplinary therapy in a comprehensive inpatient rehab setting. Physiatrist is providing close team supervision and 24 hour management of active medical problems listed below. Physiatrist and rehab team continue to assess barriers to discharge/monitor patient progress toward functional and medical goals. FIM: FIM - Bathing Bathing Steps Patient Completed: Chest;Right Arm;Left Arm;Abdomen;Front perineal area;Buttocks;Right upper leg;Right lower leg (including foot);Left upper leg;Left lower leg (including foot) Bathing: 6: Assistive device (Comment)  FIM - Upper Body Dressing/Undressing Upper body dressing/undressing steps patient completed: Thread/unthread right bra strap;Thread/unthread left bra strap;Hook/unhook bra;Thread/unthread right sleeve of pullover shirt/dresss;Thread/unthread left sleeve of pullover shirt/dress;Put head through opening of pull over shirt/dress;Pull shirt over trunk Upper body dressing/undressing: 7: Complete Independence: No helper FIM - Lower Body Dressing/Undressing Lower body  dressing/undressing  steps patient completed: Thread/unthread right underwear leg;Thread/unthread left underwear leg;Pull underwear up/down;Thread/unthread right pants leg;Thread/unthread left pants leg;Pull pants up/down;Don/Doff right sock;Don/Doff left sock;Don/Doff right shoe;Don/Doff left shoe Lower body dressing/undressing: 7: Complete Independence: No helper  FIM - Toileting Toileting steps completed by patient: Adjust clothing prior to toileting;Performs perineal hygiene;Adjust clothing after toileting Toileting Assistive Devices: Grab bar or rail for support Toileting: 6: Assistive device: No helper  FIM - Radio producer Devices: Oncologist Transfers: 6-Assistive device: No helper  FIM - Control and instrumentation engineer Devices: Best boy: 5: Supine > Sit: Supervision (verbal cues/safety issues);5: Sit > Supine: Supervision (verbal cues/safety issues);5: Bed > Chair or W/C: Supervision (verbal cues/safety issues);5: Chair or W/C > Bed: Supervision (verbal cues/safety issues)  FIM - Locomotion: Wheelchair Locomotion: Wheelchair: 0: Activity did not occur FIM - Locomotion: Ambulation Locomotion: Ambulation Assistive Devices: Nurse, adult Ambulation/Gait Assistance: 5: Supervision Locomotion: Ambulation: 5: Travels 150 ft or more with supervision/safety issues  Comprehension Comprehension Mode: Auditory Comprehension: 6-Follows complex conversation/direction: With extra time/assistive device  Expression Expression Mode: Verbal Expression: 6-Expresses complex ideas: With extra time/assistive device  Social Interaction Social Interaction: 6-Interacts appropriately with others with medication or extra time (anti-anxiety, antidepressant).  Problem Solving Problem Solving: 6-Solves complex problems: With extra time  Memory Memory: 6-More than reasonable amt of time  Medical Problem List and Plan:  1. DVT  Prophylaxis/Anticoagulation: Pharmaceutical: Lovenox  2. Pain Management: Complains of toes hurting. Support shoes recommended for protection of ischemic toes. Will treat with prn tylenol for now.  3. Mood: motivated. Seems to be dealing with current situation well. LCSW To follow up for formal evaluation.  4. Neuropsych: This patient is capable of making decisions on his/her own behalf.  5. Fluid overload:  monitor weight every other day. Continue low salt diet. Continue lasix daily. Recheck electrolytes tomorrow- push fluid intake a bit.  -overall weights and I's and O's are balanced.  -need to be consistent with weights  -check bmet tomorrow 6. Atrial fibrillation: monitor HR with bid checks. Continue amiodarone.  7. Diarrhea: On probiotics.  8. NSTEMI: Due to demand ischemia. No complaints of CP/dyspnea  9. Ischemic toes: due to sepsis/shock  -should be able to manage conservatively. See below 10. Hypokalemia: due to diuretics. Increase supplement.   -following serially  11. Glaucoma: continue lumigan bid.  12. Candidal infection: difucan completed  -use microguard powder for skin folds if needed  -keep feet dry and clean--toes are improving daily. .  -recommended that she purchase open toed sandals until toes heal  LOS (Days) 5 A FACE TO FACE EVALUATION WAS PERFORMED  [name] 04/22/2012, 7:29 AM

## 2012-04-23 ENCOUNTER — Inpatient Hospital Stay (HOSPITAL_COMMUNITY): Payer: Medicare Other | Admitting: *Deleted

## 2012-04-23 ENCOUNTER — Inpatient Hospital Stay (HOSPITAL_COMMUNITY): Payer: Medicare Other

## 2012-04-23 ENCOUNTER — Inpatient Hospital Stay (HOSPITAL_COMMUNITY): Payer: Medicare Other | Admitting: Occupational Therapy

## 2012-04-23 DIAGNOSIS — A4189 Other specified sepsis: Secondary | ICD-10-CM

## 2012-04-23 DIAGNOSIS — G9341 Metabolic encephalopathy: Secondary | ICD-10-CM

## 2012-04-23 DIAGNOSIS — R5381 Other malaise: Secondary | ICD-10-CM

## 2012-04-23 LAB — BASIC METABOLIC PANEL
BUN: 18 mg/dL (ref 6–23)
CO2: 29 mEq/L (ref 19–32)
Calcium: 9.5 mg/dL (ref 8.4–10.5)
Chloride: 100 mEq/L (ref 96–112)
Creatinine, Ser: 1.99 mg/dL — ABNORMAL HIGH (ref 0.50–1.10)
GFR calc Af Amer: 29 mL/min — ABNORMAL LOW (ref >90)
GFR calc non Af Amer: 25 mL/min — ABNORMAL LOW (ref >90)
Glucose, Bld: 87 mg/dL (ref 70–99)
Potassium: 4.5 mEq/L (ref 3.5–5.1)
Sodium: 140 mEq/L (ref 135–145)

## 2012-04-23 MED ORDER — SODIUM CHLORIDE 0.9 % IV SOLN
INTRAVENOUS | Status: DC
  Administered 2012-04-23: 75 mL/h via INTRAVENOUS
  Administered 2012-04-24: 06:00:00 via INTRAVENOUS

## 2012-04-23 NOTE — Progress Notes (Signed)
Occupational Therapy Session Note &  Discharge Summary  Patient Details  Name: [name] MRN: [id number] Date of Birth: [birth year]  Today's Date: 04/23/2012  SESSION NOTE [id number] - 69 Minutes Individual Therapy No complaints of pain this am Patient found supine in bed with complaints of being cold. Patient engaged in bed mobility and functional ambulation throughout room using quad cane for ADL at shower level. Patient performed BADL at overall modified independent level; requiring no verbal cues or physical assistance. Patient demonstrated good safety awareness throughout ADL with use of quad can appropriately. Focused skilled intervention on UB/LB bathing & dressing, bed mobility, overall activity tolerance/endurance, sit/stands, dynamic standing balance/tolerance/endurance,and grooming tasks standing at sink. Patient made modified independent in room to move about room prn. Patient is independent with UE HEP. After ADL patient ambulated, using quad cane, -> ADL apartment for simple meal prep in kitchen making an egg with overall distant supervision and some assistance with set-up. At end of session left patient seated in therapy gym for next therapy session.   -----------------------------------------------------------------------------------------------------------------------------  DISCHARGE SUMMARY Patient has met 12 of 12 long term goals due to improved activity tolerance, improved balance, postural control, ability to compensate for deficits, improved attention, improved awareness and improved coordination.  Patient to discharge at overall Modified Independent level.  No family present for education, patient demonstrates good safety awareness and is able to complete BADLs at mod I level. For simple meal prep recommend patient have someone present for set-up assistance; patient verbalizes that daughter can assist/set-up prn.    Reasons goals not met: n/a; all goals met at this  time.  Recommendation: No additional occupational therapy recommended at this time.   Equipment: Recommend patient use a BSC over toilet seat for safety and sit during showers. Per patient report she has a BSC and built-in shower seat in her walk-in shower.   Reasons for discharge: treatment goals met and discharge from hospital  Patient/family agrees with progress made and goals achieved: Yes  Precautions/Restrictions  Precautions Precautions: Fall Restrictions Weight Bearing Restrictions: No  Vital Signs Therapy Vitals Temp: 97.9 F (36.6 C) Temp src: Oral Pulse Rate: 86  Resp: 18  BP: 104/67 mmHg Patient Position, if appropriate: Lying Oxygen Therapy SpO2: 92 % O2 Device: None (Room air)  Pain Pain Assessment Pain Assessment: No/denies pain Pain Score: 0-No pain Faces Pain Scale: No hurt  ADL - See FIM  Vision/Perception  Vision - History Baseline Vision: Wears glasses only for reading Patient Visual Report: No change from baseline Vision - Assessment Additional Comments: Had cataract surgery a couple years ago per patient report Perception Perception: Within Functional Limits Praxis Praxis: Intact   Cognition Overall Cognitive Status: Appears within functional limits for tasks assessed Arousal/Alertness: Awake/alert Orientation Level: Oriented X4 Memory: Appears intact Awareness: Appears intact Problem Solving: Appears intact Safety/Judgment: Appears intact  Sensation Sensation Light Touch: Appears Intact Stereognosis: Appears Intact Hot/Cold: Appears Intact Proprioception: Appears Intact Additional Comments: Appear intact for bilateral UEs Coordination Gross Motor Movements are Fluid and Coordinated: Yes Fine Motor Movements are Fluid and Coordinated: Yes  Motor - See Discharge Navigator   Mobility  - See Discharge Navigator   Trunk/Postural Assessment  - See Discharge Navigator   Balance - See Discharge Navigator   Extremity/Trunk  Assessment RUE Assessment RUE Assessment: Within Functional Limits LUE Assessment LUE Assessment: Within Functional Limits  See FIM for current functional status  Jaynie Hitch 04/23/2012, 7:47 AM

## 2012-04-23 NOTE — Progress Notes (Signed)
Physical Therapy Session Note  Patient Details  Name: [name] MRN: [id number] Date of Birth: [birth year]  Today's Date: 04/23/2012 Time: [id number] Time Calculation (min): 43 min  Short Term Goals: Week 1:  PT Short Term Goal 1 (Week 1): = LTGs  Skilled Therapeutic Interventions/Progress Updates:   Treatment focused on grad day activities including gait with quad cane, gait without AD, stair training, dynamic gait activities on tile and carpeted floors to simulate home environment with obstacle negotiation, quick start and stops, and varied speed. Pt at mod I level with quad cane for all mobility. Readministered Berg balance test with significant improvement with score going from 21 initially to 49/56 today. Patient demonstrates increased fall risk as noted by score of   49/56 on Berg Balance Scale.  (<36= high risk for falls, close to 100%; 37-45 significant >80%; 46-51 moderate >50%; 52-55 lower >25%) and results discussed with pt who verbalized understanding. Discussed plan for outing this PM and pt excited to get out of the hospital.    Therapy Documentation Precautions:  Precautions Precautions: Fall Restrictions Weight Bearing Restrictions: No   Pain: Pain Assessment Pain Assessment: No/denies pain Pain Score: 0-No pain Faces Pain Scale: No hurt  See FIM for current functional status  Therapy/Group: Individual Therapy  Canary Brim [facility] 04/23/2012, 11:01 AM

## 2012-04-23 NOTE — Progress Notes (Signed)
Patient reported she tolerated outing to "[employer]". Patient reported she hit the top of her head entering the [place]. RN assessed patient's head and no redness of swelling noted patient reported some soreness to top of head . Instructed patient to notify RN if further pain noted . Continue with plan of care .             Mliss Sax

## 2012-04-23 NOTE — Progress Notes (Signed)
Recreational Therapy Discharge Summary Patient Details  Name: [name] MRN: [id number] Date of Birth: Oct 15, 1945 Today's Date: 04/23/2012  Long term goals set: 1  Long term goals met: 1  Comments on progress toward goals: pt has made excellent progress toward goal, meeting supervision level for community reintegration.  Pt educated on overall safety and energy conservation techniques.  Pt is ready for discharge home tomorrow.  Reasons for discharge: discharge from hospital  Patient/family agrees with progress made and goals achieved: Yes  [name] 04/23/2012, 4:00 PM

## 2012-04-23 NOTE — Progress Notes (Signed)
Recreational Therapy Session Note  Patient Details  Name: [name] MRN: [id number] Date of Birth: [birth year] Today's Date: 04/23/2012 Time:  1300-1430 Pain: no c/o Skilled Therapeutic Interventions/Progress Updates: Pt participated in community reintegration/outing to [place] ambulatory level using [facility] with supervision.  Pt did require Mod assist to ascend van steps at the end of outing due to fatigue.  Pt educated on energy conservation techniques and stated understanding.  Pt stated that she fatiued quicker than she expected and learned a lot from this experience.  Upon entering the [place], pt hit her head on door frame, pt without complaints from incident, RN informed upon return from outing.  Therapy/Group: [employer]  Mirielle Byrum 04/23/2012, 3:53 PM

## 2012-04-23 NOTE — Evaluation (Signed)
Recreational Therapy Assessment and Plan  Patient Details  Name: [name] MRN: [id number] Date of Birth: 1946/03/28 Today's Date: 04/23/2012  Rehab Potential: Good ELOS: 10 days   Assessment Clinical Impression: Problem List:  Patient Active Problem List   Diagnosis   .  Septic shock   .  Pyelonephritis   .  Hypoxemia   .  Encephalopathy   .  Nephrolithiasis   .  AKI (acute kidney injury)   .  Glaucoma   .  Physical deconditioning    Past Medical History:  Past Medical History   Diagnosis  Date   .  Hypertension    .  Pneumonia    .  GERD (gastroesophageal reflux disease)    .  Headache    .  Glaucoma     Past Surgical History:  Past Surgical History   Procedure  Date   .  Abdominal hysterectomy    .  Cholecystectomy    .  Cystoscopy w/ ureteral stent placement  04/04/2012     Procedure: CYSTOSCOPY WITH RETROGRADE PYELOGRAM/URETERAL STENT PLACEMENT; Surgeon: Ailene Rud, MD; Location: [place]; Service: Urology; Laterality: Left;    Assessment & Plan  Clinical Impression: Patient is a 66 y.o. year old female with recent admission to the hospital with history of HTN, nephrolithiasis; who was admitted on 10/15 with progressive pain with fever, chills, dysuria and confusion. She was noted to be hypoxic and in uroseptic shock due to & mm obstructing stone in left ureter. She developed respiratory failure requiring intubation. She was taken to OR on 10/16 for L-JJ stent placement by Dr. Gaynelle Arabian. BC/UC positive for E.Coli. Was treated with sepsis protocol and had prolonged intubation. Cardiac echo done revealing diffuse hypokinesis with EF 20-25% likely due to septic CM. She developed A-fib and converted to NSR on amiodarone. NSTEMI likely due to demand ischemia. She has been confused with agitation and was treated with propofol and MIND trial for delirium. Neurology consulted for input and felt that patient with acute metabolic encephalopathy. Lactic acid and ammonia  levels normal. EEG with generalized background slowing.  Repeat cardiac echo done showing improvement in EF to 60-65%. Dr. Percival Spanish consulted for input and recommended continuing amiodarone 400 mg bid with transition to 200 mg daily at discharge. No indications for coumadin at this time. Issues with acute renal failure, fluid overload, leucocytosis and thrombocytopenia resolving. She was extubated on 10/23 and NPO due to concerns of dysphagia. FEES done and patient started on regular diet. PT/OT evaluations done and patient noted to be deconditioned.confusion and paranoia is resolving. Mentation is improving but patient continues with slow movements. Patient transferred to CIR on 04/17/2012.   Pt referred by team for community reintegration activities.  Pt presents with decreased activity tolerance, decreased functional mobility, decreased balance, & decreased awareness of energy conservation techniques limiting pt's independence with leisure/community pursuits.  Leisure History/Participation Premorbid leisure interest/current participation: [employer];Community - Travel (Comment) Expression Interests: Music (Comment);Singing (church choir) Leisure Participation Style: Alone;With Family/Friends Awareness of Community Resources: Good-identify 3 post discharge leisure resources Psychosocial / Spiritual Spiritual Interests: Church Does patient have pets?: Yes Social interaction - Mood/Behavior: Cooperative Academic librarian Appropriate for Education?: Yes Patient Agreeable to [employer]?: Yes Recreational Therapy Orientation Orientation -Reviewed with patient: Available activity resources Strengths/Weaknesses Patient Strengths/Abilities: Willingness to participate;Active premorbidly Patient weaknesses: Physical limitations  Plan Rec Therapy Plan Is patient appropriate for Therapeutic Recreation?: Yes Rehab Potential: Good Treatment times per week: 1  time for community reintegration Estimated Length of Stay: 10 days TR Treatment/Interventions: Adaptive equipment instruction;Community reintegration;Patient/family education;Therapeutic activities  Recommendations for other services: None  Discharge Criteria: Patient will be discharged from TR if patient refuses treatment 3 consecutive times without medical reason.  If treatment goals not met, if there is a change in medical status, if patient makes no progress towards goals or if patient is discharged from hospital.  The above assessment, treatment plan, treatment alternatives and goals were discussed and mutually agreed upon: by patient  [place] 04/23/2012, 3:48 PM

## 2012-04-23 NOTE — Progress Notes (Signed)
Physical Therapy Discharge Summary  Patient Details  Name: [name] MRN: [id number] Date of Birth: March 10, 1946  Today's Date: 04/23/2012  Patient has met 8 of 8 long term goals due to improved activity tolerance, improved balance and improved coordination.  Patient to discharge at an ambulatory level Modified Independent using quad cane. Pt has made excellent progress while on CIR. Pt participated in community outing successfully while on final day on rehab. Pt feels confident with planned d/c and is very motivated.  Reasons goals not met: n/a  Recommendation:  Patient will benefit from ongoing skilled PT services in outpatient setting to continue to advance safe functional mobility, address ongoing impairments in balance, gait, endurance, muscular endurance, and minimize fall risk.  Equipment: No equipment provided. Pt already owns quad cane (and multiple other DME).  Reasons for discharge: treatment goals met and discharge from hospital  Patient/family agrees with progress made and goals achieved: Yes  PT Discharge Precautions/Restrictions Precautions Precautions: Fall Restrictions Weight Bearing Restrictions: No Pain Pain Assessment Pain Assessment: No/denies pain Pain Score: 0-No pain Faces Pain Scale: No hurt Vision/Perception  Vision - History Baseline Vision: Wears glasses only for reading Patient Visual Report: No change from baseline Vision - Assessment Additional Comments: Had cataract surgery a couple years ago per patient report Perception Perception: Within Functional Limits Praxis Praxis: Intact  Cognition Overall Cognitive Status: Appears within functional limits for tasks assessed Arousal/Alertness: Awake/alert Orientation Level: Oriented X4 Memory: Appears intact Awareness: Appears intact Problem Solving: Appears intact Safety/Judgment: Appears intact Sensation Sensation Light Touch: Impaired Detail Light Touch Impaired Details: Impaired  RLE;Impaired LLE (bottom of foot and toes (per report improved since admission) Stereognosis: Appears Intact Hot/Cold: Appears Intact Proprioception: Impaired Detail Proprioception Impaired Details: Impaired RLE;Impaired LLE (toes/foot due to decreased sensation) Additional Comments: Appear intact for bilateral UEs Coordination Gross Motor Movements are Fluid and Coordinated: Yes Fine Motor Movements are Fluid and Coordinated: Yes Motor  Motor Motor: Within Functional Limits  Locomotion  Ambulation Ambulation/Gait Assistance: 6: Modified independent (Device/Increase time) Gait Gait velocity: 2.187 High Level Ambulation High Level Ambulation: Side stepping;Backwards walking;Sudden stops  Trunk/Postural Assessment  Cervical Assessment Cervical Assessment: Within Functional Limits Thoracic Assessment Thoracic Assessment: Within Functional Limits Lumbar Assessment Lumbar Assessment: Within Functional Limits Postural Control Postural Control: Within Functional Limits  Balance Berg Balance Test Sit to Stand: Able to stand without using hands and stabilize independently Standing Unsupported: Able to stand safely 2 minutes Sitting with Back Unsupported but Feet Supported on Floor or Stool: Able to sit safely and securely 2 minutes Stand to Sit: Sits safely with minimal use of hands Transfers: Able to transfer safely, minor use of hands Standing Unsupported with Eyes Closed: Able to stand 10 seconds safely Standing Ubsupported with Feet Together: Able to place feet together independently and stand 1 minute safely From Standing, Reach Forward with Outstretched Arm: Can reach forward >12 cm safely (5") From Standing Position, Pick up Object from Floor: Able to pick up shoe safely and easily From Standing Position, Turn to Look Behind Over each Shoulder: Looks behind from both sides and weight shifts well Turn 360 Degrees: Able to turn 360 degrees safely one side only in 4 seconds or  less Standing Unsupported, Alternately Place Feet on Step/Stool: Able to stand independently and complete 8 steps >20 seconds Standing Unsupported, One Foot in Front: Able to plae foot ahead of the other independently and hold 30 seconds Standing on One Leg: Tries to lift leg/unable to hold 3 seconds but remains  standing independently Total Score: 49  Static Sitting Balance Static Sitting - Level of Assistance: 7: Independent Dynamic Sitting Balance Dynamic Sitting - Level of Assistance: 7: Independent Static Standing Balance Static Standing - Level of Assistance: 6: Modified independent (Device/Increase time) Dynamic Standing Balance Dynamic Standing - Level of Assistance: 6: Modified independent (Device/Increase time) Extremity Assessment  RUE Assessment RUE Assessment: Within Functional Limits LUE Assessment LUE Assessment: Within Functional Limits RLE Assessment RLE Assessment: Within Functional Limits (decreased muscular endurance) LLE Assessment LLE Assessment: Within Functional Limits (decreased muscular endurance)  See FIM for current functional status  Canary Brim [facility] 04/23/2012, 2:50 PM

## 2012-04-23 NOTE — Progress Notes (Signed)
Subjective/Complaints: Had a good day with therapies yesterday. No complaints this am. Questions about rash on legs  A 12 point review of systems has been performed and if not noted above is otherwise negative.  Objective: Vital Signs: Blood pressure 104/67, pulse 86, temperature 97.9 F (36.6 C), temperature source Oral, resp. rate 18, height 5\' 4"  (1.626 m), weight 93.2 kg (205 lb 7.5 oz), SpO2 92.00%. No results found. No results found for this basename: WBC:2,HGB:2,HCT:2,PLT:2 in the last 72 hours No results found for this basename: NA:2,K:2,CL:2,CO2:2,GLUCOSE:2,BUN:2,CREATININE:2,CALCIUM:2 in the last 72 hours CBG (last 3)  No results found for this basename: GLUCAP:3 in the last 72 hours  Wt Readings from Last 3 Encounters:  04/22/12 93.2 kg (205 lb 7.5 oz)  04/16/12 104.191 kg (229 lb 11.2 oz)  04/16/12 104.191 kg (229 lb 11.2 oz)    Physical Exam:  Nursing note and vitals reviewed.  Constitutional: She is oriented to person, place, and time. She appears well-developed and well-nourished.  Obese female  HENT:  Head: Normocephalic and atraumatic.  Mouth/Throat: Oropharynx is clear and moist.  Eyes: Conjunctivae normal and EOM are normal. Pupils are equal, round, and reactive to light.  Neck: Normal range of motion. Neck supple. No JVD present. No tracheal deviation present. No thyromegaly present.  Cardiovascular: Normal rate, regular rhythm and normal heart sounds. Exam reveals no gallop and no friction rub.  No murmur heard.  Pulmonary/Chest: Effort normal and breath sounds normal. No respiratory distress. She has no wheezes.  Abdominal: Soft. Bowel sounds are normal. She exhibits no distension. There is no tenderness. There is no rebound.  Musculoskeletal: She exhibits edema.  2+ pedal edema. Minimal pretibial edema noted.  Neurological: She is alert and oriented to person, place, and time.  Follows commands without difficulty. Paranoia and confusion has resolved. Speech  clear. Strength grossly 4/5 UE and 2-3/5 prox legs to 4/5 distally. No sensory deficits.  Skin: Skin is warm. Papular rash on both legs MASD under bilateral breast with linear skin tears. Toes on bilateral feet with evidence of ischemia--reddened with crusted drainage. Slightly red between toes R-2nd toe with black eschar on tip. Right lateral forefoot with streaky erythema. Multiple toes with signs of ischemia to varying degrees---no change presently. Feet are sensitive to touch.  Psychiatric: She has a normal mood and affect. Her speech is normal and behavior is normal. Judgment and thought content normal. Her mood appears not anxious. Thought content is not paranoid and not delusional. Cognition and memory are normal.    Assessment/Plan: 1. Functional deficits secondary to deconditioning after septic shock with subsequent metabolic encephalopathy which require 3+ hours per day of interdisciplinary therapy in a comprehensive inpatient rehab setting. Physiatrist is providing close team supervision and 24 hour management of active medical problems listed below. Physiatrist and rehab team continue to assess barriers to discharge/monitor patient progress toward functional and medical goals. FIM: FIM - Bathing Bathing Steps Patient Completed: Chest;Right Arm;Left Arm;Abdomen;Front perineal area;Buttocks;Right upper leg;Left upper leg;Right lower leg (including foot);Left lower leg (including foot) Bathing: 6: Assistive device (Comment)  FIM - Upper Body Dressing/Undressing Upper body dressing/undressing steps patient completed: Thread/unthread right bra strap;Thread/unthread left bra strap;Hook/unhook bra;Thread/unthread right sleeve of pullover shirt/dresss;Thread/unthread left sleeve of pullover shirt/dress;Put head through opening of pull over shirt/dress;Pull shirt over trunk Upper body dressing/undressing: 7: Complete Independence: No helper FIM - Lower Body Dressing/Undressing Lower body  dressing/undressing steps patient completed: Thread/unthread right underwear leg;Thread/unthread left underwear leg;Pull underwear up/down;Thread/unthread right pants leg;Thread/unthread left pants leg;Pull  pants up/down;Don/Doff right sock;Don/Doff left sock;Don/Doff right shoe;Don/Doff left shoe Lower body dressing/undressing: 7: Complete Independence: No helper  FIM - Toileting Toileting steps completed by patient: Adjust clothing prior to toileting;Performs perineal hygiene;Adjust clothing after toileting Toileting Assistive Devices: Grab bar or rail for support Toileting: 6: Assistive device: No helper  FIM - Radio producer Devices: Oncologist Transfers: 6-Assistive device: No helper  FIM - Control and instrumentation engineer Devices: Best boy: 5: Supine > Sit: Supervision (verbal cues/safety issues);5: Sit > Supine: Supervision (verbal cues/safety issues);5: Bed > Chair or W/C: Supervision (verbal cues/safety issues);5: Chair or W/C > Bed: Supervision (verbal cues/safety issues)  FIM - Locomotion: Wheelchair Locomotion: Wheelchair: 0: Activity did not occur FIM - Locomotion: Ambulation Locomotion: Ambulation Assistive Devices: Nurse, adult Ambulation/Gait Assistance: 5: Supervision Locomotion: Ambulation: 5: Travels 150 ft or more with supervision/safety issues  Comprehension Comprehension Mode: Auditory Comprehension: 6-Follows complex conversation/direction: With extra time/assistive device  Expression Expression Mode: Verbal Expression: 6-Expresses complex ideas: With extra time/assistive device  Social Interaction Social Interaction: 6-Interacts appropriately with others with medication or extra time (anti-anxiety, antidepressant).  Problem Solving Problem Solving: 6-Solves complex problems: With extra time  Memory Memory: 6-More than reasonable amt of time  Medical Problem List and Plan:  1. DVT  Prophylaxis/Anticoagulation: Pharmaceutical: Lovenox  2. Pain Management: Complains of toes hurting. Support shoes recommended for protection of ischemic toes. Will treat with prn tylenol for now.  3. Mood: motivated. Seems to be dealing with current situation well. LCSW To follow up for formal evaluation.  4. Neuropsych: This patient is capable of making decisions on his/her own behalf.  5. Fluid overload:  monitor weight every other day. Continue low salt diet. Continue lasix daily. Recheck electrolytes tomorrow- push fluid intake a bit.  -overall weights and I's and O's are balanced.  -need to be consistent with weights  -check bmet tomorrow 6. Atrial fibrillation: monitor HR with bid checks. Continue amiodarone.  7. Diarrhea: On probiotics.  8. NSTEMI: Due to demand ischemia. No complaints of CP/dyspnea  9. Ischemic toes: due to sepsis/shock  -should be able to manage conservatively. See below 10. Hypokalemia: due to diuretics. Increase supplement.   -following serially  11. Glaucoma: continue lumigan bid.  12. Candidal infection: difucan completed  -use microguard powder for skin folds if needed  -keep feet dry and clean--toes are improving daily. .  -recommended that she purchase open toed sandals until toes heal  LOS (Days) 6 A FACE TO FACE EVALUATION WAS PERFORMED  [name] 04/23/2012, 6:53 AM

## 2012-04-23 NOTE — Progress Notes (Signed)
Physical Therapy Note  Patient Details  Name: [name] MRN: [id number] Date of Birth: 02-03-1946 Today's Date: 04/23/2012  1300-1430 (90 minutes) Individual therapy/community reintegration with Recreational therapy  Pt participated in community outing to [employer] hardware with emphasis on endurance, gait in community setting, stair negotiation of van steps, and energy conservation techniques. Pt became very fatigued during outing. Upon entering [place] the final time, required mod A to go up the steps due to fatigue and hit her head on the top of the [place]; RN notified when pt returned. Pt denies any symptoms or pain. Pt was grateful for opportunity to participate and stated she learned a lot about her endurance today.   Canary Brim [facility] 04/23/2012, 2:34 PM

## 2012-04-24 DIAGNOSIS — A4189 Other specified sepsis: Secondary | ICD-10-CM

## 2012-04-24 DIAGNOSIS — R5381 Other malaise: Secondary | ICD-10-CM

## 2012-04-24 DIAGNOSIS — G9341 Metabolic encephalopathy: Secondary | ICD-10-CM

## 2012-04-24 LAB — BASIC METABOLIC PANEL
BUN: 15 mg/dL (ref 6–23)
CO2: 21 mEq/L (ref 19–32)
Calcium: 9 mg/dL (ref 8.4–10.5)
Chloride: 101 mEq/L (ref 96–112)
Creatinine, Ser: 1.9 mg/dL — ABNORMAL HIGH (ref 0.50–1.10)
GFR calc Af Amer: 30 mL/min — ABNORMAL LOW (ref >90)
GFR calc non Af Amer: 26 mL/min — ABNORMAL LOW (ref >90)
Glucose, Bld: 105 mg/dL — ABNORMAL HIGH (ref 70–99)
Potassium: 4.8 mEq/L (ref 3.5–5.1)
Sodium: 135 mEq/L (ref 135–145)

## 2012-04-24 MED ORDER — LORATADINE 10 MG PO TABS: 10.0000 mg | ORAL_TABLET | Freq: Every day | ORAL | Status: DC

## 2012-04-24 MED ORDER — FLUCONAZOLE 100 MG PO TABS: 100.0000 mg | ORAL_TABLET | Freq: Every day | ORAL | Status: DC | PRN

## 2012-04-24 MED ORDER — AMIODARONE HCL 200 MG PO TABS: 200.0000 mg | ORAL_TABLET | Freq: Every day | ORAL | Status: DC

## 2012-04-24 MED ORDER — NYSTATIN 100000 UNIT/GM EX POWD: CUTANEOUS | Status: DC

## 2012-04-24 NOTE — Progress Notes (Signed)
Pt discharged home with family. Discharge instructions provided by Algis Liming, PA. All questions answered. Pt escorted off unit in w/c with personal belonging by Dorian Heckle, NT

## 2012-04-24 NOTE — Discharge Summary (Signed)
[name], Reilly               ACCOUNT NO.:  [id number]  MEDICAL RECORD NO.:  [id number]  LOCATION:  [id number]                         FACILITY:  [facility]  PHYSICIAN:  Meredith Staggers, M.D.DATE OF BIRTH:  1946/05/31  DATE OF ADMISSION:  04/17/2012 DATE OF DISCHARGE:  04/24/2012                              DISCHARGE SUMMARY   DISCHARGE DIAGNOSES: 1. Deconditioning due to septic shock and subsequent metabolic     encephalopathy. 2. Metabolic encephalopathy. 3. Acute renal insufficiency. 4. Fluid overload. 5. Atrial fibrillation. 6. Non-ST segment elevation myocardial infarction due to demand     ischemia. 7. Ischemic toes. 8. Candidal infection.  HISTORY OF PRESENT ILLNESS:  [name] is a 65 year old female with history of hypertension, nephrolithiasis was admitted April 03, 2012, with pain, fever, dysuria and confusion due to uroseptic shock and obstructing stone in left ureter.  She required intubation for respiratory failure.  Was taken to OR April 04, 2012, for left JJ stent by Dr. Gaynelle Arabian.  Blood cultures, urine cultures were positive for E. coli.  The patient was treated with sepsis protocol as well as prolonged intubation.  Cardiac echo was done revealing diffuse hypokinesis with EF of 20-25% due to septic cardiomyopathy.  She developed AFib; was started on amiodarone and converted to normal sinus rhythm.  Cardiology was consulted and felt NSTEMI likely due to demand ischemia.  Repeat cardiac echo showed improvement with EF of 60- 65%.  The patient is to continue on amiodarone with recommendations to taper it 200 mg a day at the time of discharge.  No indications for Coumadin at this time.  She was noted to develop confusion and agitation due to metabolic encephalopathy.  EEG done showed generalized background slowing.  Hospital course has been complicated by acute renal failure, fluid overload, leukocytosis as well as thrombocytopenia which are currently  now resolving.  Diet was initiated past extubation.  Therapy evaluations were done and the patient was noted to be deconditioned. Therapy team recommended CIR for progression.  PAST MEDICAL HISTORY:  Hypertension, GERD, headaches, pneumonia.  FUNCTIONAL HISTORY:  The patient was independent and working part-time prior to admission.  FUNCTIONAL STATUS:  The patient required min assist for bed mobility, mod assist 60% for sit to stand transfers, +2 total assist 70% to ambulate 80 feet with cues for posture and safety with very slow inefficient gait.  She required total assist for bathing and dressing tasks.  PERTINENT LABORATORY FINDINGS:  Check of lytes from November 5 revealed sodium 135, potassium 4.8, chloride 101, CO2 21, BUN 15, creatinine 1.90, glucose 105.  CBC of October 30 reveals hemoglobin 10.1, hematocrit 30.3, white count 6.4, platelets 229.  HOSPITAL COURSE:  [name] was admitted to Rehab on April 17, 2012, for inpatient therapies to consist of PT, OT at least 3 hours 5 days a week.  Past admission, physiatrist, rehab RN, and therapy team have worked together to provide customized collaborative interdisciplinary care.  Rehab RN has worked on bowel and bladder program, as well as skin monitoring.  The patient was noted to have ischemic toes on bilateral feet with maceration in interdigital areas. She  Also had MASD under her breast  and these were treated with silver impregnated clots.  She was also treated with short course of Diflucan with improvement in her symptoms.  She reported problems with diarrhea and was started on probiotics.  Mild hypokalemia was noted at time of admission, likely due to diuretics and potassium was added for supplement.   Check of labs of November 1, showed renal insufficiency with BUN at 1.82; therefore, her Lasix was discontinued.  Blood pressures have been checked on b.i.d. basis and these have been well Controlled. Heart  rate has been stable in 80s range overall. The patient has been continent of bowel and bladder.  Last weight is  at 97.2 kg.  Repeat labs of November 4, showed creatinine with further elevation at 1.99; therefore, she was treated with 1 L of IV fluids.  Recheck labs of November 5, shows creatinine to be stable overall at 1.90.  PVR were done and patient is voiding without any signs of retention.  No dysuria, frequency reported.  The patient was advised to have lytes  rechecked at [facility] office on November 7. Primary care was contacted regarding having labs checked.  During the patient's stay in rehab, team conference was held to monitor the patient's progress, set goals, as well as discuss barriers to discharge.  Physical therapy has worked with the patient on mobility, strengthening as well as balance.  The patient is modified independent for transfers and mobility with use of quad cane.  Berg balance score shows significant improvement up to 49/56.  She was made modified independent in supervised setting.  She requires some supervision out of home setting.  OT has worked with the patient on self-care tasks. The patient has made excellent progress.  She is modified independent for BADLs requiring no verbal cues or physical assistance.  She is showing good safety awareness throughout these tasks.  The patient was educated on upper extremity home exercise plan.  She requires distant supervision at simple meal preparation tasks.  Further followup outpatient physical therapy to continue past discharge.  On April 24, 2012, the patient is discharged to home in improved condition.  DISCHARGE MEDICATIONS: 1. Claritin-D 10 mg per day. 2. Nystatin powder to skin folds b.i.d. when irritated. 3. Amiodarone 200 mg p.o. per day. 4. Diflucan 100 mg p.o. per day p.r.n. irritation. 5. Coated aspirin 81 mg a day. 6. Lumigan 0.01% one drop both eyes at bedtime. 7. Multivitamin 1 p.o. per day.  DIET:   Low fat, low cholesterol.  ACTIVITY LEVEL:  As tolerated with intermittent supervision and use of cane.  SPECIAL INSTRUCTIONS:  Outpatient physical therapy to begin November 11th, at 8:15 p.m.  Do not use HCTZ or lisinopril, drink plenty of water.  Wear shoes to protect your feet when up.  Apply nystatin powder and dry dressing to areas under the breast and in between toes to prevent any maceration.  FOLLOWUP:  The patient to follow up with Dr. Carolan Clines on December 9th, at 12:45.  Follow up with Dr. Minus Breeding in 2 weeks. Follow up with Dr. Lajean Manes for post hospital office visit on November 14th, at 1:30 p.m. and for monitoring of ischemic toes. Needs labs rechecked  on November 7 for monitoring of creatinine. Follow up with Dr. Naaman Plummer as needed.     Reesa Chew, P.A.   ______________________________ Meredith Staggers, M.D.    PL/MEDQ  D:  04/24/2012  T:  04/24/2012  Job:  [id number]  cc:   Hal T. Stoneking, M.D. Sigmund  Joya San, M.D. Minus Breeding, MD, [facility]

## 2012-04-24 NOTE — Progress Notes (Signed)
Social Work  Discharge Note  The overall goal for the admission was met for:   Discharge location: Yes - home with daughter to assist  Length of Stay: Yes - 7 days  Discharge activity level: Yes - modified independent overall  Home/community participation: Yes  Services provided included: MD, RD, PT, OT, RN, TR, Pharmacy and SW  Financial Services: Medicare  Follow-up services arranged: Outpatient: PT via [facility] and Patient/Family has no preference for HH/DME agencies  Comments (or additional information):  Patient/Family verbalized understanding of follow-up arrangements: Yes  Individual responsible for coordination of the follow-up plan: patient  Confirmed correct DME delivered: NA  Matej Sappenfield

## 2012-04-24 NOTE — Patient Care Conference (Signed)
Inpatient RehabilitationTeam Conference Note Date: 04/24/2012   Time: 2:40 PM    Patient Name: [name]      Medical Record Number: [id number]  Date of Birth: 08/05/1945 Sex: Female         Room/Bed: 4007/4007-01 Payor Info: Payor: [employer]UE [employer] [place] MEDICARE  Plan: BLUE MEDICARE  Product Type: *No Product type*     Admitting Diagnosis: metabolic enceph,deconditioned  Admit Date/Time:  04/17/2012  4:44 PM Admission Comments: No comment available   Primary Diagnosis:  Physical deconditioning Principal Problem: Physical deconditioning  Patient Active Problem List   Diagnosis Date Noted  . Physical deconditioning 04/18/2012  . Glaucoma   . Septic shock 04/04/2012  . Pyelonephritis 04/04/2012  . Hypoxemia 04/04/2012  . Encephalopathy 04/04/2012  . Nephrolithiasis 04/04/2012  . AKI (acute kidney injury) 04/04/2012    Expected Discharge Date: Expected Discharge Date: 04/24/12  Team Members Present: Physician: Dr. Alger Simons Social Worker Present: Lennart Pall, LCSW Nurse Present: Janyth Pupa, RN PT Present: Canary Brim, PT OT Present: Chrys Racer, OT Other (Discipline and Name): Daiva Nakayama, PPS Coordinator     Current Status/Progress Goal Weekly Team Focus  Medical   improving stamina, slight azotemia likely from diuresis, resolving sepsis  woud care, pain mgt, balance volume s  rehydration, skin care   Bowel/Bladder             Swallow/Nutrition/ Hydration             ADL's   Mod I  Mod I  D/C planning   Mobility   mod I with quad cane  mod I overall  d/c - all goals met   Communication             Safety/Cognition/ Behavioral Observations            Pain             Skin              Rehab Goals Patient on target to meet rehab goals: Yes *See Interdisciplinary Assessment and Plan and progress notes for long and short-term goals  Barriers to Discharge: stamina    Possible Resolutions to Barriers:  pacing    Discharge  Planning/Teaching Needs:  home with daughter to provide any needed assistance      Team Discussion:  Excellent gains and ready for d/c - no concerns  Revisions to Treatment Plan:  none   Continued Need for Acute Rehabilitation Level of Care: The patient requires daily medical management by a physician with specialized training in physical medicine and rehabilitation for the following conditions: Daily direction of a multidisciplinary physical rehabilitation program to ensure safe treatment while eliciting the highest outcome that is of practical value to the patient.: Yes Daily medical management of patient stability for increased activity during participation in an intensive rehabilitation regime.: Yes Daily analysis of laboratory values and/or radiology reports with any subsequent need for medication adjustment of medical intervention for : Cardiac problems;Pulmonary problems;Neurological problems  [name] 04/24/2012, 4:28 PM

## 2012-04-24 NOTE — Progress Notes (Signed)
Subjective/Complaints: Ready to go home today.  A 12 point review of systems has been performed and if not noted above is otherwise negative.  Objective: Vital Signs: Blood pressure 132/82, pulse 85, temperature 97.6 F (36.4 C), temperature source Oral, resp. rate 20, height 5\' 4"  (1.626 m), weight 97.2 kg (214 lb 4.6 oz), SpO2 94.00%. No results found. No results found for this basename: WBC:2,HGB:2,HCT:2,PLT:2 in the last 72 hours  Basename 04/23/12 1205  NA 140  K 4.5  CL 100  CO2 29  GLUCOSE 87  BUN 18  CREATININE 1.99*  CALCIUM 9.5   CBG (last 3)  No results found for this basename: GLUCAP:3 in the last 72 hours  Wt Readings from Last 3 Encounters:  04/24/12 97.2 kg (214 lb 4.6 oz)  04/16/12 104.191 kg (229 lb 11.2 oz)  04/16/12 104.191 kg (229 lb 11.2 oz)    Physical Exam:  Nursing note and vitals reviewed.  Constitutional: She is oriented to person, place, and time. She appears well-developed and well-nourished.  Obese female  HENT:  Head: Normocephalic and atraumatic.  Mouth/Throat: Oropharynx is clear and moist.  Eyes: Conjunctivae normal and EOM are normal. Pupils are equal, round, and reactive to light.  Neck: Normal range of motion. Neck supple. No JVD present. No tracheal deviation present. No thyromegaly present.  Cardiovascular: Normal rate, regular rhythm and normal heart sounds. Exam reveals no gallop and no friction rub.  No murmur heard.  Pulmonary/Chest: Effort normal and breath sounds normal. No respiratory distress. She has no wheezes.  Abdominal: Soft. Bowel sounds are normal. She exhibits no distension. There is no tenderness. There is no rebound.  Musculoskeletal:  . Minimal pretibial edema noted.  Neurological: She is alert and oriented to person, place, and time.  Follows commands without difficulty. Paranoia and confusion has resolved. Speech clear. Strength grossly 4/5 UE and 2-3/5 prox legs to 4/5 distally. No sensory deficits.  Skin: Skin  is warm. Papular rash on both legs MASD under bilateral breast with linear skin tears. Toes with eschar on the right greater than left. No further breakdown. Areas dry. Psychiatric: She has a normal mood and affect. Her speech is normal and behavior is normal. Judgment and thought content normal. Her mood appears not anxious. Thought content is not paranoid and not delusional. Cognition and memory are normal.    Assessment/Plan: 1. Functional deficits secondary to deconditioning after septic shock with subsequent metabolic encephalopathy which require 3+ hours per day of interdisciplinary therapy in a comprehensive inpatient rehab setting. Physiatrist is providing close team supervision and 24 hour management of active medical problems listed below. Physiatrist and rehab team continue to assess barriers to discharge/monitor patient progress toward functional and medical goals.  Home today with [place] follow up. Needs follow up of feet at some point. I would be willing to do that if needed.   FIM: FIM - Bathing Bathing Steps Patient Completed: Chest;Right Arm;Left Arm;Abdomen;Front perineal area;Buttocks;Right upper leg;Left upper leg;Right lower leg (including foot);Left lower leg (including foot) Bathing: 6: Assistive device (Comment)  FIM - Upper Body Dressing/Undressing Upper body dressing/undressing steps patient completed: Thread/unthread right bra strap;Thread/unthread left bra strap;Hook/unhook bra;Thread/unthread right sleeve of pullover shirt/dresss;Thread/unthread left sleeve of pullover shirt/dress;Put head through opening of pull over shirt/dress;Pull shirt over trunk Upper body dressing/undressing: 7: Complete Independence: No helper FIM - Lower Body Dressing/Undressing Lower body dressing/undressing steps patient completed: Thread/unthread right underwear leg;Thread/unthread left underwear leg;Pull underwear up/down;Thread/unthread right pants leg;Thread/unthread left pants leg;Pull  pants up/down;Don/Doff right  sock;Don/Doff left sock;Don/Doff right shoe;Don/Doff left shoe Lower body dressing/undressing: 6: Assistive device (Comment)  FIM - Toileting Toileting steps completed by patient: Adjust clothing prior to toileting;Performs perineal hygiene;Adjust clothing after toileting Toileting Assistive Devices: Grab bar or rail for support Toileting: 6: Assistive device: No helper  FIM - Radio producer Devices: Elevated toilet seat;Bedside commode;Grab bars;Oncologist Transfers: 6-Assistive device: No helper  FIM - Control and instrumentation engineer Devices: Best boy: 6: Assistive device: no helper  FIM - Locomotion: Wheelchair Locomotion: Wheelchair: 0: Activity did not occur FIM - Locomotion: Ambulation Locomotion: Ambulation Assistive Devices: Nurse, adult Ambulation/Gait Assistance: 6: Modified independent (Device/Increase time) Locomotion: Ambulation: 6: Travels 150 ft or more with assistive device/no helper  Comprehension Comprehension Mode: Auditory Comprehension: 6-Follows complex conversation/direction: With extra time/assistive device  Expression Expression Mode: Verbal Expression: 6-Expresses complex ideas: With extra time/assistive device  Social Interaction Social Interaction: 6-Interacts appropriately with others with medication or extra time (anti-anxiety, antidepressant).  Problem Solving Problem Solving: 6-Solves complex problems: With extra time  Memory Memory: 6-More than reasonable amt of time  Medical Problem List and Plan:  1. DVT Prophylaxis/Anticoagulation: Pharmaceutical: Lovenox  2. Pain Management: Complains of toes hurting. Support shoes recommended for protection of ischemic toes. Will treat with prn tylenol for now.  3. Mood: motivated. Seems to be dealing with current situation well. LCSW To follow up for formal evaluation.  4. Neuropsych: This patient is capable of  making decisions on his/her own behalf.  5. Fluid overload:  --now hypovomeic .Marland Kitchenmonitor weight every other day. Continue low salt diet. Continue lasix daily.  push fluid intake  .  -ivf started to give her a little "push" given lab yesterday  -need to be consistent with weights  -check bmet again this am  6. Atrial fibrillation: monitor HR with bid checks. Continue amiodarone.  7. Diarrhea: On probiotics.  8. NSTEMI: Due to demand ischemia. No complaints of CP/dyspnea  9. Ischemic toes: due to sepsis/shock  -should be able to manage conservatively. See below 10. Hypokalemia: due to diuretics. Increase supplement.   -following serially  11. Glaucoma: continue lumigan bid.  12. Candidal infection: difucan completed  -use microguard powder for skin folds if needed  -keep feet dry and clean--toes are improving daily. .  -recommended that she purchase open toed sandals until toes heal  LOS (Days) 7 A FACE TO FACE EVALUATION WAS PERFORMED  SWARTZ,ZACHARY T 04/24/2012, 7:29 AM

## 2012-04-30 ENCOUNTER — Ambulatory Visit: Payer: Medicare Other | Attending: Physical Medicine & Rehabilitation | Admitting: *Deleted

## 2012-04-30 DIAGNOSIS — R269 Unspecified abnormalities of gait and mobility: Secondary | ICD-10-CM | POA: Insufficient documentation

## 2012-04-30 DIAGNOSIS — IMO0001 Reserved for inherently not codable concepts without codable children: Secondary | ICD-10-CM | POA: Insufficient documentation

## 2012-04-30 DIAGNOSIS — M6281 Muscle weakness (generalized): Secondary | ICD-10-CM | POA: Insufficient documentation

## 2012-05-04 ENCOUNTER — Ambulatory Visit: Payer: Medicare Other | Admitting: *Deleted

## 2012-05-07 ENCOUNTER — Ambulatory Visit: Payer: Medicare Other | Admitting: *Deleted

## 2012-05-09 ENCOUNTER — Ambulatory Visit: Payer: Medicare Other | Admitting: *Deleted

## 2012-05-10 ENCOUNTER — Ambulatory Visit: Payer: Medicare Other | Admitting: Cardiovascular Disease

## 2012-05-14 ENCOUNTER — Ambulatory Visit: Payer: Medicare Other | Admitting: *Deleted

## 2012-05-15 ENCOUNTER — Other Ambulatory Visit: Payer: Self-pay | Admitting: Urology

## 2012-05-15 NOTE — Pre-Procedure Instructions (Signed)
Asked to bring blue folder the day of the procedure,insurance card,I.D. driver's license,wear comfortable clothing and have a driver for the day. Asked not to take Advil,Motrin,Ibuprofen,Aleve or any NSAIDS, Aspirin, or Toradol for 72 hours prior to procedure,  No vitamins or herbal medications 7 days prior to procedure. Instructed to take laxative per doctor's office instructions and eat a light dinner the evening before procedure.   To arrive [phone]  for lithotripsy procedure.

## 2012-05-16 ENCOUNTER — Ambulatory Visit: Payer: Medicare Other | Admitting: *Deleted

## 2012-05-21 ENCOUNTER — Ambulatory Visit: Payer: Medicare Other | Admitting: Physical Therapy

## 2012-05-21 ENCOUNTER — Ambulatory Visit (HOSPITAL_COMMUNITY)
Admission: RE | Admit: 2012-05-21 | Discharge: 2012-05-21 | Disposition: A | Discharge status: Home / Self Care | Payer: Medicare Other | Source: Ambulatory Visit | Attending: Urology | Admitting: Urology

## 2012-05-21 ENCOUNTER — Encounter (HOSPITAL_COMMUNITY)
Admission: RE | Disposition: A | Discharge status: Home / Self Care | Payer: Self-pay | Source: Ambulatory Visit | Attending: Urology

## 2012-05-21 DIAGNOSIS — Z538 Procedure and treatment not carried out for other reasons: Secondary | ICD-10-CM | POA: Insufficient documentation

## 2012-05-21 DIAGNOSIS — N200 Calculus of kidney: Secondary | ICD-10-CM | POA: Insufficient documentation

## 2012-05-21 SURGERY — LITHOTRIPSY, ESWL
Anesthesia: LOCAL | Laterality: Left

## 2012-05-21 MED ORDER — DIPHENHYDRAMINE HCL 25 MG PO CAPS: 25.0000 mg | ORAL_CAPSULE | ORAL | Status: DC

## 2012-05-21 MED ORDER — DEXTROSE-NACL 5-0.45 % IV SOLN: INTRAVENOUS | Status: DC

## 2012-05-21 MED ORDER — CIPROFLOXACIN HCL 500 MG PO TABS: 500.0000 mg | ORAL_TABLET | ORAL | Status: DC

## 2012-05-21 MED ORDER — DIAZEPAM 5 MG PO TABS: 10.0000 mg | ORAL_TABLET | ORAL | Status: DC

## 2012-05-21 NOTE — Interval H&P Note (Signed)
History and Physical Interval Note:  05/21/2012 5:18 PM  [name]  has presented today for surgery, with the diagnosis of Left Renal Stone  The various methods of treatment have been discussed with the patient and family. After consideration of risks, benefits and other options for treatment, the patient has consented to  Procedure(s) (LRB) with comments: EXTRACORPOREAL SHOCK WAVE LITHOTRIPSY (ESWL) (Left) as a surgical intervention .  The patient's history has been reviewed, patient examined, no change in status, stable for surgery.  I have reviewed the patient's chart and labs.  Questions were answered to the patient's satisfaction.     [name] I  Pt's surgery cancelled because she took ALEVE yesterday. ( cannot take anti-inflammatory within 3 days of lithotripsy). She had received all pre-op counseling. She will be re-scheduled.

## 2012-05-21 NOTE — H&P (Signed)
hief Complaint  cc: Dr. Lajean Manes c: Dr. [facility]   Reason For Visit  Discuss surgery   Active Problems Problems  1. Nephrolithiasis Of Both Kidneys 592.0 2. Ureteral Stone 592.1  History of Present Illness           66 YO female returns today to discuss surgery for possible ESWL vs cysto/RPG/ureteroscopy/basket ext.  She was last seen by Jimmey Ralph, NP on 05/10/12 for complaint of cloudy urine and flank pain.  Patient was given Rocephin 500mg  on 05/10/12 & started on Septra DS BID x 7 days.    Hx of urosepsis. Had emergent cystoureterscopy, L RPG, and placement of double J stent for impacted 7 mm left upper ureteral calculus by Dr. Gaynelle Arabian 04/07/12.    The pt has had multiple stones, and has seen Dr. Rosana Hoes ( ? Jeffie Pollock) in the distant past. She has never passed stones, but pain has always subsided. No reason ever given for her recurrent stone formation. Regular diet. Sodas: 1 Sprite/day ( none now). Water: 3 bottles/day. Family hx of stones: father, brother, with recurrent stones.   Past Medical History Problems  1. History of  Acute Renal Insufficiency 593.9 2. History of  Anxiety (Symptom) 300.00 3. History of  Atrial Fibrillation 427.31 4. History of  Atrial Fibrillation 427.31 5. History of  Esophageal Reflux 530.81 6. History of  Esophageal Reflux 530.81 7. History of  Glaucoma 365.9 8. History of  Hypertension 401.9 9. History of  Hypertension 401.9 10. History of  Migraine Headache 346.90 11. History of  Nephrolithiasis V13.01 12. History of  Pneumonia V12.61 13. History of  Septicemia 038.9 14. History of  ST Elevation Myocardial Infarction 410.90  Surgical History Problems  1. History of  Cholecystectomy 2. History of  Cholecystectomy 3. History of  Cystoscopy With Insertion Of Ureteral Stent Left 4. History of  Hysterectomy V45.77 5. History of  Hysterectomy V45.77 6. History of  Knee Surgery  Current Meds 1. Amiodarone HCl 200 MG Oral Tablet;  TAKE 1 TABLET DAILY; Therapy: (Recorded:21Nov2013) to 2. AmLODIPine Besylate TABS; Therapy: (Recorded:21Nov2013) to 3. Aspirin 81 MG Oral Tablet; Therapy: (Recorded:21Nov2013) to 4. CefTRIAXone Sodium 500 MG Injection Solution Reconstituted; INJECT 500 MG Intramuscular;  To Be Done: [id number]; Status: HOLD FOR - Administration 5. Claritin 10 MG Oral Capsule; Therapy: (Recorded:21Nov2013) to 6. Lumigan 0.01 % Ophthalmic Solution; Therapy: (Recorded:21Nov2013) to 7. Multiple Vitamin TABS; Therapy: (Recorded:21Nov2013) to 8. SMZ-TMP DS 800-160 MG Oral Tablet; TAKE 1 TABLET BID; Therapy: [phone] to  (Evaluate:28Nov2013)  Requested for: [phone]; Last Rx:21Nov2013 9. TraMADol HCl 50 MG Oral Tablet; TAKE 1 TABLET Every 6 hours PRN; Therapy: [id number] to  (Last Rx:21Nov2013)  Requested for: 21Nov2013  Allergies Medication  1. Vicodin TABS  Family History Problems  1. Family history of  Death In The Family Father Died age 12- pancreatic cancer 2. Family history of  Death In The Family Mother Died age 69-Dementia 32. Paternal history of  Nephrolithiasis 4. Fraternal history of  Nephrolithiasis  Social History Problems  1. Marital History - Widowed 2. 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 History of  Alcohol Use 5. History of  Caffeine Use 6. History of  Tobacco Use 305.1  Review of Systems Genitourinary, constitutional, skin, eye, otolaryngeal, hematologic/lymphatic, cardiovascular, pulmonary, endocrine, musculoskeletal, gastrointestinal, neurological and psychiatric system(s) were reviewed and pertinent findings if present are noted.  Genitourinary: hematuria and cloudy urine.    Vitals Vital Signs [Data Includes: Last 1 Day]  [id number]  12:45PM  Blood Pressure: 142 / 83 Temperature: 97.4 F Heart Rate: 91  Physical Exam Abdomen: The abdomen is soft and nontender. No masses are palpated. No CVA tenderness. No hernias are palpable. No hepatosplenomegaly  noted.    Results/Data Urine [Data Includes: Last 1 Day]   [id number]  COLOR YELLOW   APPEARANCE CLOUDY   SPECIFIC GRAVITY 1.020   pH 6.0   GLUCOSE NEG mg/dL  BILIRUBIN SMALL   KETONE NEG mg/dL  BLOOD LARGE   PROTEIN 100 mg/dL  UROBILINOGEN 0.2 mg/dL  NITRITE NEG   LEUKOCYTE ESTERASE MOD   SQUAMOUS EPITHELIAL/HPF RARE   WBC TNTC WBC/hpf  RBC 21-50 RBC/hpf  BACTERIA MANY   CRYSTALS NONE SEEN   CASTS NONE SEEN   Selected Results  UA With REFLEX [id number] 12:26PM Carolan Clines   Test Name Result Flag Reference  COLOR YELLOW  YELLOW  APPEARANCE CLOUDY A CLEAR  SPECIFIC GRAVITY 1.020  1.005-1.030  pH 6.0  5.0-8.0  GLUCOSE NEG mg/dL  NEG  BILIRUBIN SMALL A NEG  KETONE NEG mg/dL  NEG  BLOOD LARGE A NEG  PROTEIN 100 mg/dL A NEG  UROBILINOGEN 0.2 mg/dL  0.0-1.0  NITRITE NEG  NEG  LEUKOCYTE ESTERASE MOD A NEG  SQUAMOUS EPITHELIAL/HPF RARE  RARE  WBC TNTC WBC/hpf A <3  RBC 21-50 RBC/hpf A <3  BACTERIA MANY A RARE  Microscopic exam performed on   CRYSTALS NONE SEEN  NONE SEEN  CASTS NONE SEEN  NONE SEEN   URINE CULTURE 21Nov2013 01:53PM Jimmey Ralph  Source : CATHED SPECIMEN TYPE: CATH URINE   Test Name Result Flag Reference  CULTURE, URINE Culture, Urine    ===== COLONY COUNT: =====  15,000 COLONIES/ML   FINAL REPORT: Multiple bacterial morphotypes present, none  predominant. Suggest appropriate recollection if   clinically indicated.   AU CT-STONE PROTOCOL [id number] 12:00AM Carolan Clines   Test Name Result Flag Reference  ** RADIOLOGY REPORT BY [facility] **   *RADIOLOGY REPORT*  Clinical Data: Microhematuria. Back pain  CT ABDOMEN AND PELVIS WITHOUT CONTRAST (URINARY CALCULUS PROTOCOL)  Technique: Multidetector CT imaging was performed through the abdomen and pelvis without intravenous contrast to include the urinary tract.  Comparison: 04/04/2012  Findings:  No focal liver abnormalities. Previous cholecystectomy.  No biliary dilatation. The pancreas is normal. Visualized portions of the spleen appear normal.  Normal appearance of the adrenal glands. 4.2 mm nonobstructing calculus is noted within the inferior pole the right kidney, image 23. Within the inferior pole of the left kidney there is a 3 mm stone, image 23. There has been interval placement of a left-sided ureteral stent with decompression of previous left hydronephrosis. The stent appears to be in good position. There are no stones along the course of the left ureter. No bladder calculi identified.  Large hiatal hernia is present with approximately 70% intrathoracic stomach. The small bowel loops have a normal caliber. The appendix is visualized and appears normal. Multiple distal colonic diverticula identified without acute inflammation.  No free fluid or fluid collections identified within the abdomen or pelvis.  A periumbilical hernia is identified, image 39, which contains fat only. Within the ventral pelvic wall there is a large, complex hernia which contains fat only. This is best seen on image 64.  Review of the visualized osseous structures is significant for mild multilevel degenerative disc disease. This is most severe at the L1-2 and L2-3 level. A first-degree anterolisthesis of L2-3 is noted.  IMPRESSION:  1. Interval placement  of a left-sided Nephro ureteral stent with decompression left-sided hydronephrosis. The stent appears to be in good position. 2. Bilateral nonobstructing renal calculi. 3. Ventral abdominal wall hernias. 4. Hiatal hernia.   Original Report Authenticated By: Kerby Moors, M.D.   Assessment Assessed  1. Nephrolithiasis Of Both Kidneys 592.0 2. Pyuria 791.9   Recurrent bilateral nephrolithiasis, now with JJ stent in place. She will have lithotripsy, wiht cath urine today for c/s pre-op. She will eventually need lithotripsy on the R side also. She has normal diet. She needs litholink.     We have reviewed her KUB, and her CT, and her recent hospitalization.   Plan Health Maintenance (V70.0)  1. UA With REFLEX  Done: [id number] 12:26PM Nephrolithiasis Of Both Kidneys (592.0)  2. Follow-up Schedule Surgery Office  Follow-up  Done: [id number] Pyuria (791.9)  3. Cath for Specimen  Done: [id number] 4. URINE CULTURE  Requested for: [id number]    1. Schedule L renal lithotripsy 2. Cath for urine c/s today ( on Septra)-finish septra-2-3 more days.  3. Will eventually need R renal lithotripsy 4. Eventual need 24 hr urine litholink   Signatures Electronically signed by : Carolan Clines, M.D.; May 14 2012  2:33PM

## 2012-05-21 NOTE — Progress Notes (Signed)
Pt took aleve yesterday am. [employer] truck , [place], informed. KUB film taken to truck to evaluate location of stone.Procedure cancelled

## 2012-05-22 ENCOUNTER — Encounter (HOSPITAL_COMMUNITY): Payer: Self-pay | Admitting: *Deleted

## 2012-05-22 ENCOUNTER — Ambulatory Visit: Payer: Medicare Other | Admitting: *Deleted

## 2012-05-22 NOTE — Pre-Procedure Instructions (Addendum)
Asked to bring blue folder the day of the procedure,insurance card,I.D. driver's license,wear comfortable clothing and have a driver for the day. Asked not to take Advil,Motrin,Ibuprofen,Aleve or any NSAIDS, Aspirin, or Toradol for 72 hours prior to procedure,  No vitamins or herbal medications 7 days prior to procedure. Plans to stop vitamins,Naproxen and Aspirin. Instructed to take laxative per doctor's office instructions and eat a light dinner the evening before procedure.   To arrive at 1530 for lithotripsy procedure.  EKG on chart from 04-15-2012

## 2012-05-23 ENCOUNTER — Encounter (HOSPITAL_COMMUNITY): Payer: Self-pay | Admitting: Pharmacy Technician

## 2012-05-24 ENCOUNTER — Ambulatory Visit: Payer: Medicare Other | Attending: Physical Medicine & Rehabilitation | Admitting: *Deleted

## 2012-05-24 DIAGNOSIS — R269 Unspecified abnormalities of gait and mobility: Secondary | ICD-10-CM | POA: Insufficient documentation

## 2012-05-24 DIAGNOSIS — IMO0001 Reserved for inherently not codable concepts without codable children: Secondary | ICD-10-CM | POA: Insufficient documentation

## 2012-05-24 DIAGNOSIS — M6281 Muscle weakness (generalized): Secondary | ICD-10-CM | POA: Insufficient documentation

## 2012-05-25 ENCOUNTER — Other Ambulatory Visit: Payer: Self-pay | Admitting: Urology

## 2012-05-25 ENCOUNTER — Ambulatory Visit: Payer: Medicare Other | Admitting: *Deleted

## 2012-05-28 ENCOUNTER — Encounter (HOSPITAL_COMMUNITY): Payer: Self-pay | Admitting: *Deleted

## 2012-05-28 ENCOUNTER — Ambulatory Visit (HOSPITAL_COMMUNITY): Payer: Medicare Other

## 2012-05-28 ENCOUNTER — Ambulatory Visit (HOSPITAL_COMMUNITY)
Admission: RE | Admit: 2012-05-28 | Discharge: 2012-05-28 | Disposition: A | Discharge status: Home / Self Care | Payer: Medicare Other | Source: Ambulatory Visit | Attending: Urology | Admitting: Urology

## 2012-05-28 ENCOUNTER — Encounter (HOSPITAL_COMMUNITY)
Admission: RE | Disposition: A | Discharge status: Home / Self Care | Payer: Self-pay | Source: Ambulatory Visit | Attending: Urology

## 2012-05-28 DIAGNOSIS — I10 Essential (primary) hypertension: Secondary | ICD-10-CM | POA: Insufficient documentation

## 2012-05-28 DIAGNOSIS — N289 Disorder of kidney and ureter, unspecified: Secondary | ICD-10-CM | POA: Insufficient documentation

## 2012-05-28 DIAGNOSIS — N200 Calculus of kidney: Secondary | ICD-10-CM | POA: Insufficient documentation

## 2012-05-28 DIAGNOSIS — Z7982 Long term (current) use of aspirin: Secondary | ICD-10-CM | POA: Insufficient documentation

## 2012-05-28 DIAGNOSIS — R82998 Other abnormal findings in urine: Secondary | ICD-10-CM | POA: Insufficient documentation

## 2012-05-28 DIAGNOSIS — Z79899 Other long term (current) drug therapy: Secondary | ICD-10-CM | POA: Insufficient documentation

## 2012-05-28 DIAGNOSIS — I252 Old myocardial infarction: Secondary | ICD-10-CM | POA: Insufficient documentation

## 2012-05-28 DIAGNOSIS — I4891 Unspecified atrial fibrillation: Secondary | ICD-10-CM | POA: Insufficient documentation

## 2012-05-28 DIAGNOSIS — N201 Calculus of ureter: Secondary | ICD-10-CM | POA: Insufficient documentation

## 2012-05-28 DIAGNOSIS — K219 Gastro-esophageal reflux disease without esophagitis: Secondary | ICD-10-CM | POA: Insufficient documentation

## 2012-05-28 SURGERY — LITHOTRIPSY, ESWL
Anesthesia: LOCAL | Laterality: Left

## 2012-05-28 MED ORDER — DIPHENHYDRAMINE HCL 25 MG PO CAPS
25.0000 mg | ORAL_CAPSULE | ORAL | Status: AC
  Administered 2012-05-28: 25 mg via ORAL
  Filled 2012-05-28: qty 1

## 2012-05-28 MED ORDER — DIAZEPAM 5 MG PO TABS
10.0000 mg | ORAL_TABLET | ORAL | Status: AC
  Administered 2012-05-28: 10 mg via ORAL
  Filled 2012-05-28: qty 2

## 2012-05-28 MED ORDER — CIPROFLOXACIN HCL 500 MG PO TABS
500.0000 mg | ORAL_TABLET | ORAL | Status: AC
  Administered 2012-05-28: 500 mg via ORAL
  Filled 2012-05-28: qty 1

## 2012-05-28 MED ORDER — DEXTROSE-NACL 5-0.45 % IV SOLN
INTRAVENOUS | Status: DC
  Administered 2012-05-28: 17:00:00 via INTRAVENOUS

## 2012-05-28 NOTE — Interval H&P Note (Signed)
History and Physical Interval Note:  05/28/2012 6:11 PM  [name]  has presented today for surgery, with the diagnosis of LEFT RENAL STONE  The various methods of treatment have been discussed with the patient and family. After consideration of risks, benefits and other options for treatment, the patient has consented to  Procedure(s) (LRB) with comments: EXTRACORPOREAL SHOCK WAVE LITHOTRIPSY (ESWL) (Left) as a surgical intervention .  The patient's history has been reviewed, patient examined, no change in status, stable for surgery.  I have reviewed the patient's chart and labs.  Questions were answered to the patient's satisfaction.     Carolan Clines I

## 2012-05-28 NOTE — H&P (Signed)
hief Complaint  cc: Dr. Lajean Manes c: Dr. [facility]   Reason For Visit  Discuss surgery   Active Problems Problems  1. Nephrolithiasis Of Both Kidneys 592.0 2. Ureteral Stone 592.1  History of Present Illness           66 YO female returns today to discuss surgery for possible ESWL vs cysto/RPG/ureteroscopy/basket ext.  She was last seen by Jimmey Ralph, NP on 05/10/12 for complaint of cloudy urine and flank pain.  Patient was given Rocephin 500mg  on 05/10/12 & started on Septra DS BID x 7 days.    Hx of urosepsis. Had emergent cystoureterscopy, L RPG, and placement of double J stent for impacted 7 mm left upper ureteral calculus by Dr. Gaynelle Arabian 04/07/12.    The pt has had multiple stones, and has seen Dr. Rosana Hoes ( ? Jeffie Pollock) in the distant past. She has never passed stones, but pain has always subsided. No reason ever given for her recurrent stone formation. Regular diet. Sodas: 1 Sprite/day ( none now). Water: 3 bottles/day. Family hx of stones: father, brother, with recurrent stones.   Past Medical History Problems  1. History of  Acute Renal Insufficiency 593.9 2. History of  Anxiety (Symptom) 300.00 3. History of  Atrial Fibrillation 427.31 4. History of  Atrial Fibrillation 427.31 5. History of  Esophageal Reflux 530.81 6. History of  Esophageal Reflux 530.81 7. History of  Glaucoma 365.9 8. History of  Hypertension 401.9 9. History of  Hypertension 401.9 10. History of  Migraine Headache 346.90 11. History of  Nephrolithiasis V13.01 12. History of  Pneumonia V12.61 13. History of  Septicemia 038.9 14. History of  ST Elevation Myocardial Infarction 410.90  Surgical History Problems  1. History of  Cholecystectomy 2. History of  Cholecystectomy 3. History of  Cystoscopy With Insertion Of Ureteral Stent Left 4. History of  Hysterectomy V45.77 5. History of  Hysterectomy V45.77 6. History of  Knee Surgery  Current Meds 1. Amiodarone HCl 200 MG Oral Tablet;  TAKE 1 TABLET DAILY; Therapy: (Recorded:21Nov2013) to 2. AmLODIPine Besylate TABS; Therapy: (Recorded:21Nov2013) to 3. Aspirin 81 MG Oral Tablet; Therapy: (Recorded:21Nov2013) to 4. CefTRIAXone Sodium 500 MG Injection Solution Reconstituted; INJECT 500 MG Intramuscular;  To Be Done: [id number]; Status: HOLD FOR - Administration 5. Claritin 10 MG Oral Capsule; Therapy: (Recorded:21Nov2013) to 6. Lumigan 0.01 % Ophthalmic Solution; Therapy: (Recorded:21Nov2013) to 7. Multiple Vitamin TABS; Therapy: (Recorded:21Nov2013) to 8. SMZ-TMP DS 800-160 MG Oral Tablet; TAKE 1 TABLET BID; Therapy: [phone] to  (Evaluate:28Nov2013)  Requested for: [phone]; Last Rx:21Nov2013 9. TraMADol HCl 50 MG Oral Tablet; TAKE 1 TABLET Every 6 hours PRN; Therapy: [id number] to  (Last Rx:21Nov2013)  Requested for: 21Nov2013  Allergies Medication  1. Vicodin TABS  Family History Problems  1. Family history of  Death In The Family Father Died age 67- pancreatic cancer 2. Family history of  Death In The Family Mother Died age 3-Dementia 18. Paternal history of  Nephrolithiasis 4. Fraternal history of  Nephrolithiasis  Social History Problems  1. Marital History - Widowed 2. 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 History of  Alcohol Use 5. History of  Caffeine Use 6. History of  Tobacco Use 305.1  Review of Systems Genitourinary, constitutional, skin, eye, otolaryngeal, hematologic/lymphatic, cardiovascular, pulmonary, endocrine, musculoskeletal, gastrointestinal, neurological and psychiatric system(s) were reviewed and pertinent findings if present are noted.  Genitourinary: hematuria and cloudy urine.    Vitals Vital Signs [Data Includes: Last 1 Day]  [id number]  12:45PM  Blood Pressure: 142 / 83 Temperature: 97.4 F Heart Rate: 91  Physical Exam Abdomen: The abdomen is soft and nontender. No masses are palpated. No CVA tenderness. No hernias are palpable. No hepatosplenomegaly  noted.    Results/Data Urine [Data Includes: Last 1 Day]   [id number]  COLOR YELLOW   APPEARANCE CLOUDY   SPECIFIC GRAVITY 1.020   pH 6.0   GLUCOSE NEG mg/dL  BILIRUBIN SMALL   KETONE NEG mg/dL  BLOOD LARGE   PROTEIN 100 mg/dL  UROBILINOGEN 0.2 mg/dL  NITRITE NEG   LEUKOCYTE ESTERASE MOD   SQUAMOUS EPITHELIAL/HPF RARE   WBC TNTC WBC/hpf  RBC 21-50 RBC/hpf  BACTERIA MANY   CRYSTALS NONE SEEN   CASTS NONE SEEN   Selected Results  UA With REFLEX [id number] 12:26PM Carolan Clines   Test Name Result Flag Reference  COLOR YELLOW  YELLOW  APPEARANCE CLOUDY A CLEAR  SPECIFIC GRAVITY 1.020  1.005-1.030  pH 6.0  5.0-8.0  GLUCOSE NEG mg/dL  NEG  BILIRUBIN SMALL A NEG  KETONE NEG mg/dL  NEG  BLOOD LARGE A NEG  PROTEIN 100 mg/dL A NEG  UROBILINOGEN 0.2 mg/dL  0.0-1.0  NITRITE NEG  NEG  LEUKOCYTE ESTERASE MOD A NEG  SQUAMOUS EPITHELIAL/HPF RARE  RARE  WBC TNTC WBC/hpf A <3  RBC 21-50 RBC/hpf A <3  BACTERIA MANY A RARE  Microscopic exam performed on unconcentrated urine  CRYSTALS NONE SEEN  NONE SEEN  CASTS NONE SEEN  NONE SEEN   URINE CULTURE 21Nov2013 01:53PM Jimmey Ralph  Source : CATHED SPECIMEN TYPE: CATH URINE   Test Name Result Flag Reference  CULTURE, URINE Culture, Urine    ===== COLONY COUNT: =====  15,000 COLONIES/ML   FINAL REPORT: Multiple bacterial morphotypes present, none  predominant. Suggest appropriate recollection if   clinically indicated.   AU CT-STONE PROTOCOL [id number] 12:00AM Carolan Clines   Test Name Result Flag Reference  ** RADIOLOGY REPORT BY [facility] **   *RADIOLOGY REPORT*  Clinical Data: Microhematuria. Back pain  CT ABDOMEN AND PELVIS WITHOUT CONTRAST (URINARY CALCULUS PROTOCOL)  Technique: Multidetector CT imaging was performed through the abdomen and pelvis without intravenous contrast to include the urinary tract.  Comparison: 04/04/2012  Findings:  No focal liver abnormalities. Previous  cholecystectomy. No biliary dilatation. The pancreas is normal. Visualized portions of the spleen appear normal.  Normal appearance of the adrenal glands. 4.2 mm nonobstructing calculus is noted within the inferior pole the right kidney, image 23. Within the inferior pole of the left kidney there is a 3 mm stone, image 23. There has been interval placement of a left-sided ureteral stent with decompression of previous left hydronephrosis. The stent appears to be in good position. There are no stones along the course of the left ureter. No bladder calculi identified.  Large hiatal hernia is present with approximately 70% intrathoracic stomach. The small bowel loops have a normal caliber. The appendix is visualized and appears normal. Multiple distal colonic diverticula identified without acute inflammation.  No free fluid or fluid collections identified within the abdomen or pelvis.  A periumbilical hernia is identified, image 39, which contains fat only. Within the ventral pelvic wall there is a large, complex hernia which contains fat only. This is best seen on image 64.  Review of the visualized osseous structures is significant for mild multilevel degenerative disc disease. This is most severe at the L1-2 and L2-3 level. A first-degree anterolisthesis of L2-3 is noted.  IMPRESSION:  1. Interval  placement of a left-sided Nephro ureteral stent with decompression left-sided hydronephrosis. The stent appears to be in good position. 2. Bilateral nonobstructing renal calculi. 3. Ventral abdominal wall hernias. 4. Hiatal hernia.   Original Report Authenticated By: Kerby Moors, M.D.   Assessment Assessed  1. Nephrolithiasis Of Both Kidneys 592.0 2. Pyuria 791.9   Recurrent bilateral nephrolithiasis, now with JJ stent in place. She will have lithotripsy, wiht cath urine today for c/s pre-op. She will eventually need lithotripsy on the R side also. She has normal diet. She  needs litholink.    We have reviewed her KUB, and her CT, and her recent hospitalization.   Plan Health Maintenance (V70.0)  1. UA With REFLEX  Done: [id number] 12:26PM Nephrolithiasis Of Both Kidneys (592.0)  2. Follow-up Schedule Surgery Office  Follow-up  Done: [id number] Pyuria (791.9)  3. Cath for Specimen  Done: [id number] 4. URINE CULTURE  Requested for: [id number]    1. Schedule L renal lithotripsy 2. Cath for urine c/s today ( on Septra)-finish septra-2-3 more days.  3. Will eventually need R renal lithotripsy 4. Eventual need 24 hr urine litholink   Signatures Electronically signed by : Carolan Clines, M.D.; May 14 2012  2:33PM

## 2012-05-29 ENCOUNTER — Ambulatory Visit: Payer: Medicare Other | Admitting: *Deleted

## 2012-05-31 ENCOUNTER — Ambulatory Visit: Payer: Medicare Other | Admitting: *Deleted

## 2012-06-04 ENCOUNTER — Ambulatory Visit: Payer: Medicare Other | Admitting: *Deleted

## 2012-06-06 ENCOUNTER — Ambulatory Visit: Payer: Medicare Other | Admitting: *Deleted

## 2012-06-20 DIAGNOSIS — IMO0001 Reserved for inherently not codable concepts without codable children: Secondary | ICD-10-CM

## 2012-06-20 HISTORY — DX: Reserved for inherently not codable concepts without codable children: IMO0001

## 2012-07-03 ENCOUNTER — Other Ambulatory Visit: Payer: Self-pay | Admitting: Interventional Cardiology

## 2012-07-10 MED ORDER — SODIUM BICARBONATE 8.4 % IV SOLN
330.0000 mL/h | INTRAVENOUS | Status: AC
  Filled 2012-07-10: qty 500

## 2012-07-10 MED ORDER — SODIUM BICARBONATE 8.4 % IV SOLN: 110.0000 mL/h | INTRAVENOUS | Status: AC

## 2012-07-10 MED ORDER — SODIUM BICARBONATE 8.4 % IV SOLN
110.0000 mL/h | INTRAVENOUS | Status: AC
  Filled 2012-07-10: qty 1000

## 2012-07-11 ENCOUNTER — Encounter (HOSPITAL_BASED_OUTPATIENT_CLINIC_OR_DEPARTMENT_OTHER)
Admission: RE | Disposition: A | Discharge status: Home / Self Care | Payer: Self-pay | Source: Ambulatory Visit | Attending: Interventional Cardiology

## 2012-07-11 ENCOUNTER — Inpatient Hospital Stay (HOSPITAL_BASED_OUTPATIENT_CLINIC_OR_DEPARTMENT_OTHER)
Admission: RE | Admit: 2012-07-11 | Discharge: 2012-07-11 | Disposition: A | Discharge status: Home / Self Care | Payer: Medicare Other | Source: Ambulatory Visit | Attending: Interventional Cardiology | Admitting: Interventional Cardiology

## 2012-07-11 DIAGNOSIS — I214 Non-ST elevation (NSTEMI) myocardial infarction: Secondary | ICD-10-CM | POA: Insufficient documentation

## 2012-07-11 DIAGNOSIS — N289 Disorder of kidney and ureter, unspecified: Secondary | ICD-10-CM | POA: Insufficient documentation

## 2012-07-11 DIAGNOSIS — R9439 Abnormal result of other cardiovascular function study: Secondary | ICD-10-CM | POA: Insufficient documentation

## 2012-07-11 SURGERY — JV LEFT HEART CATHETERIZATION WITH CORONARY ANGIOGRAM
Anesthesia: Moderate Sedation

## 2012-07-11 MED ORDER — SODIUM CHLORIDE 0.9 % IV SOLN
INTRAVENOUS | Status: DC
  Administered 2012-07-11: 09:00:00 via INTRAVENOUS

## 2012-07-11 MED ORDER — METOPROLOL TARTRATE 25 MG PO TABS: 25.0000 mg | ORAL_TABLET | Freq: Two times a day (BID) | ORAL | Status: DC

## 2012-07-11 MED ORDER — ONDANSETRON HCL 4 MG/2ML IJ SOLN: 4.0000 mg | Freq: Four times a day (QID) | INTRAMUSCULAR | Status: DC | PRN

## 2012-07-11 MED ORDER — ASPIRIN 81 MG PO CHEW
324.0000 mg | CHEWABLE_TABLET | ORAL | Status: AC
  Administered 2012-07-11: 324 mg via ORAL

## 2012-07-11 MED ORDER — SODIUM CHLORIDE 0.9 % IV SOLN: 1.0000 mL/kg/h | INTRAVENOUS | Status: AC

## 2012-07-11 MED ORDER — SODIUM CHLORIDE 0.9 % IV SOLN: 250.0000 mL | INTRAVENOUS | Status: DC | PRN

## 2012-07-11 MED ORDER — DIAZEPAM 5 MG PO TABS
5.0000 mg | ORAL_TABLET | ORAL | Status: AC
  Administered 2012-07-11: 5 mg via ORAL

## 2012-07-11 MED ORDER — SODIUM CHLORIDE 0.9 % IJ SOLN: 3.0000 mL | INTRAMUSCULAR | Status: DC | PRN

## 2012-07-11 MED ORDER — SODIUM CHLORIDE 0.9 % IJ SOLN: 3.0000 mL | Freq: Two times a day (BID) | INTRAMUSCULAR | Status: DC

## 2012-07-11 NOTE — OR Nursing (Signed)
Meal served 

## 2012-07-11 NOTE — Progress Notes (Signed)
Bedrest begins @ 1130, tegaderm dressing applied by Suella Broad to right groin site, site level 0.

## 2012-07-11 NOTE — CV Procedure (Signed)
PROCEDURE:  Left heart catheterization with selective coronary angiography.  INDICATIONS:  Abnormal stress test, NSTEMI  The risks, benefits, and details of the procedure were explained to the patient.  The patient verbalized understanding and wanted to proceed.  Informed written consent was obtained.  PROCEDURE TECHNIQUE:  After Xylocaine anesthesia a 43F sheath was placed in the right femoral artery with a single anterior needle wall stick.   Left coronary angiography was done using a Judkins L5 guide catheter.  Right coronary angiography was done using a Judkins R4 guide catheter.  Left heart cath was done using the RCA catheter.  Left ventriculography was not done.    CONTRAST:  Total of 25 cc.  COMPLICATIONS:  None.    HEMODYNAMICS:  Aortic pressure was 136/69; LV pressure was 135/4; LVEDP 13.  There was no gradient between the left ventricle and aorta.    ANGIOGRAPHIC DATA:   The left main coronary artery is widely patent.  The left anterior descending artery is a large vessel which reaches the apex.  The LAD appears angiographically normal.  There is a large first diagonal.  The left circumflex artery is a small vessel which appears widely patent.  There is a small first obtuse marginal which is also widely patent.  Due to the vessel being so small, I was concerned that there may be an anomalous circumflex coming off of the right cusp.  Nonselective angiography from the right cusp did not reveal any additional vessel.    The right coronary artery is a large dominant vessel.  The posterior lateral artery is large and extends across the lateral wall.  This likely compensates for the very small circumflex.  The posterior descending artery is a large vessel.  The entire right coronary artery system is angiographically normal.  LEFT VENTRICULOGRAM:  Left ventricular angiogram was not done due to to her renal insufficiency.  LVEDP was 13 mmHg.  IMPRESSIONS:  1. Normal left main coronary  artery. 2. Normal left anterior descending artery and its branches. 3. Small left circumflex artery and its branches. 4. Normal right coronary artery. 5. LVEDP 13 mmHg.   RECOMMENDATION:  Continue preventive therapy.  Patient will be aggressively hydrated post procedure with saline and bicarb drips.  Contrast use was minimized due to renal insufficiency.

## 2012-07-11 NOTE — OR Nursing (Signed)
Discharge instructions reviewed and signed, pt stated understanding, ambulated in hall without difficulty, site level 0, transported to daughter's car via wheelchair 

## 2012-07-12 NOTE — H&P (Signed)
Date of Initial H&P: 06/15/12  History reviewed, patient examined, no change in status, stable for surgery.

## 2012-08-06 ENCOUNTER — Encounter: Payer: Self-pay | Admitting: Nurse Practitioner

## 2012-09-14 ENCOUNTER — Ambulatory Visit (INDEPENDENT_AMBULATORY_CARE_PROVIDER_SITE_OTHER): Payer: Medicare Other | Admitting: Nurse Practitioner

## 2012-09-14 ENCOUNTER — Encounter: Payer: Self-pay | Admitting: Nurse Practitioner

## 2012-09-14 VITALS — BP 112/70 | HR 68 | Resp 16 | Ht 64.0 in | Wt 203.0 lb

## 2012-09-14 DIAGNOSIS — B3731 Acute candidiasis of vulva and vagina: Secondary | ICD-10-CM

## 2012-09-14 DIAGNOSIS — Z01419 Encounter for gynecological examination (general) (routine) without abnormal findings: Secondary | ICD-10-CM

## 2012-09-14 DIAGNOSIS — B373 Candidiasis of vulva and vagina: Secondary | ICD-10-CM

## 2012-09-14 NOTE — Patient Instructions (Addendum)
Yeast Infection of the Skin Some yeast on the skin is normal, but sometimes it causes an infection. If you have a yeast infection, it shows up as white or light brown patches on brown skin. You can see it better in the summer on tan skin. It causes light-colored holes in your suntan. It can happen on any area of the body. This cannot be passed from person to person. HOME CARE  Scrub your skin daily with a dandruff shampoo. Your rash may take a couple weeks to get well.  Do not scratch or itch the rash. GET HELP RIGHT AWAY IF:   You get another infection from scratching. The skin may get warm, red, and may ooze fluid.  The infection does not seem to be getting better. MAKE SURE YOU:  Understand these instructions.  Will watch your condition.  Will get help right away if you are not doing well or get worse. Document Released: 05/19/2008 Document Revised: 08/29/2011 Document Reviewed: 05/19/2008 [facility] Patient Information 2013 [place].    Next pap due in 1 year Next Mammogram due: 08/2013 Examine your breast Monthly Take a Women's multivitamin Take 1200 mg. of calcium daily - prefer dietary If any concerns in interim to call back  If PCP will allow you to get Diflucan 150 mg weekly X 4 ( take 1 initially and repeat in 3 days - then keep extra 2 tabs for future use) then let me know or he can give RX - for skin yeast under breast and at peri-anal areas.

## 2012-09-14 NOTE — Progress Notes (Signed)
67 y.o. Widowed Caucasian female   G1P1001 here for annual exam.  Currently off Vagifem and Vit D RX since last October.  Some vaginal drynes but will use OTC extra virgin olive oil prn.   Not sexually active.  Some night sweats but they are tolerable.  She had septic shock from E Coli infection with a renal calclui blockage on April 04, 2012.  She had just returned from a beach trip and felt the stone give her pain, soon after was unresponsive.  She was on ventilator. She did have a stent placement at that time .  Had lithotripsy in Jan. And will be repeating this again in near future.  Still has stone on right and left.  Energy some better -  6 wks of  outpatient PT and trying to get some ambulation on her own. She has participated in the [facility] memory study protocol done after ICU events.    No LMP recorded. Patient has had a hysterectomy.          Sexually active: no  The current method of family planning is status post hysterectomy.    Exercising: yes  walkin dog 1 X day Last mammogram: 07/29/11 and 08/2012 Last pap: 07/31/07 Last BMD: 06/2011 osteopenia right hip Alcohol: no  Tobacco: no   No health maintenance topics applied.  Family History  Problem Relation Age of Onset  . Hypertension Mother   . Cancer Mother 62    breast  . Dementia Mother   . Hypertension Brother   . Diabetes Brother   . Cancer Father 28    pancreatic  . Heart failure Paternal Grandmother   . Bladder Cancer Maternal Grandfather     Patient Active Problem List  Diagnosis  . Septic shock  . Pyelonephritis  . Hypoxemia  . Encephalopathy  . Nephrolithiasis  . AKI (acute kidney injury)  . Glaucoma  . Physical deconditioning    Past Medical History  Diagnosis Date  . Hypertension   . Pneumonia   . GERD (gastroesophageal reflux disease)   . Headache   . Glaucoma   . H/O renal calculi 2002 & 2006  . H/O hiatal hernia   . Elevated cholesterol   . Anxiety Oct 2013    No treatment needed   . Heart murmur Oct 2013    A fib  . Septic shock due to Escherichia coli 10/16 2013    Hosp X 21 days multiple meds, on ventilator, medically induced comaX 1 wk    Past Surgical History  Procedure Laterality Date  . Cholecystectomy    . Cystoscopy w/ ureteral stent placement  04/04/2012    Procedure: CYSTOSCOPY WITH RETROGRADE PYELOGRAM/URETERAL STENT PLACEMENT;  Surgeon: Ailene Rud, MD;  Location: [facility];  Service: Urology;  Laterality: Left;  . Breast biopsy Left 08/23/07    benign fibrocystic with duct ectasia  . Orthopedic surgery  2010    left knee orthoscopy  . Total abdominal hysterectomy w/ bilateral salpingoophorectomy  1993    secondary to fibroids  . Abdominal hysterectomy    . Cardiac catheterization  07/2012    normal    Allergies: Percocet and Vicodin  Current Outpatient Prescriptions  Medication Sig Dispense Refill  . aspirin EC 81 MG tablet Take 81 mg by mouth every morning.      . bimatoprost (LUMIGAN) 0.01 % SOLN Place 1 drop into both eyes at bedtime.      . Calcium Carbonate-Vitamin D (CALCIUM-D PO) Take 1,000 mg by  mouth daily.      Marland Kitchen loratadine-pseudoephedrine (CLARITIN-D 24-HOUR) 10-240 MG per 24 hr tablet Take 1 tablet by mouth daily.      . metoprolol tartrate (LOPRESSOR) 25 MG tablet Take 1 tablet (25 mg total) by mouth 2 (two) times daily.      . Multiple Vitamin (MULTIVITAMIN WITH MINERALS) TABS Take 1 tablet by mouth daily.      . ranitidine (ZANTAC) 150 MG tablet Take 150 mg by mouth 2 (two) times daily.      Marland Kitchen b complex vitamins tablet Take 1 tablet by mouth daily.      . Estradiol (VAGIFEM) 10 MCG TABS Place 10 mcg vaginally 2 (two) times a week.      . fish oil-omega-3 fatty acids 1000 MG capsule Take 1,000 mg by mouth daily.      . hydrochlorothiazide (MICROZIDE) 12.5 MG capsule Take 12.5 mg by mouth daily.      . Vitamin D, Ergocalciferol, (DRISDOL) 50000 UNITS CAPS Take 50,000 Units by mouth as directed. Use every 2 weeks      . zinc  gluconate 50 MG tablet Take 50 mg by mouth daily.       No current facility-administered medications for this visit.    ROS: A comprehensive review of systems was negative.  Exam:    BP 112/70  Pulse 68  Resp 16  Ht 5\' 4"  (1.626 m)  Wt 203 lb (92.08 kg)  BMI 34.83 kg/m2 Weight change: @WEIGHTCHANGE @ Last 3 height recordings:  Ht Readings from Last 3 Encounters:  09/14/12 5\' 4"  (1.626 m)  07/11/12 5\' 4"  (1.626 m)  07/11/12 5\' 4"  (1.626 m)   General appearance: alert, cooperative and appears stated age Head: Normocephalic, without obvious abnormality Neck: no adenopathy, no carotid bruit, no JVD, supple, symmetrical, trachea midline and thyroid not enlarged, symmetric, no tenderness/mass/nodules Lungs: clear to auscultation bilaterally Breasts: normal appearance, no masses or tenderness Heart: regular rate and rhythm, S1, S2 normal, no murmur, click, rub or gallop Abdomen: soft, non-tender; bowel sounds normal; no masses,  no organomegaly Extremities: extremities normal, atraumatic, no cyanosis or edema Skin: Skin color, texture, turgor normal. No rashes or lesions or erythema - groin and under breast very irritated  secondary to yeast Lymph nodes: Cervical, supraclavicular, and axillary nodes normal. no inguinal nodes palpated Neurologic: Grossly normal   Pelvic: External genitalia:  Very red irritated secondary to yeast at the vulva and perianal areas              Urethra: normal appearing urethra with no masses, tenderness or lesions              Bartholins and Skenes: normal                 Vagina: atrophic, no discharge              Cervix: absent              Pap taken: no        Bimanual Exam:  Uterus:  surgically absent, vaginal cuff well healed                                      Adnexa:    normal adnexa without mass and nontender.  Rectovaginal: Deferred                                      Anus:  defer exam   A:  GYN well  woman exam  Acute / Chronic yeast vulvovaginitis and additional lesions under breast  Recent history of renal calculi that led to septic shock from UTI E-Coli 04/04/12 with hospitalization X 21 days  Deconditioning secondary to recent illness and has worked with PT and trying to regain strenghth     P:  Mammogram every year  pap smear not indicated  I would like to start patient on Diflucan 150 mg as she has done very well with this in the past.  Per patient PCP is opposed to this treatment for her - not sure if it was for short term or long term.  Patient is  to see PCP in 2 wks. and  will see if able to take meds now.  OTC Gold Bond does give some relief so will continue with that.  Rx. Mycolog and Mycostatin Powder in past has very little effect. She has  some at home and if needs a refill will call back.     return annually or prn      An After Visit Summary was printed and given to the patient.

## 2012-09-17 NOTE — Progress Notes (Signed)
Encounter reviewed by Dr. Alexander Mcauley Silva.  

## 2012-09-21 ENCOUNTER — Other Ambulatory Visit: Payer: Self-pay | Admitting: Urology

## 2012-09-24 ENCOUNTER — Encounter (HOSPITAL_COMMUNITY): Payer: Self-pay | Admitting: *Deleted

## 2012-09-24 NOTE — Pre-Procedure Instructions (Addendum)
Asked to bring blue folder the day of the procedure,insurance card,I.D. driver's license,wear comfortable clothing and have a driver for the day. Asked not to take Advil,Motrin,Ibuprofen,Aleve or any NSAIDS, Aspirin,Aspirin products, or Toradol for 72 hours prior to procedure,  No vitamins or herbal medications 7 days prior to procedure. Instructed to take laxative per doctor's office instructions and eat a light dinner the evening before procedure.   To arrive at 0915  for lithotripsy procedure.

## 2012-10-03 ENCOUNTER — Encounter (HOSPITAL_COMMUNITY): Payer: Self-pay | Admitting: Pharmacy Technician

## 2012-10-08 ENCOUNTER — Encounter (HOSPITAL_COMMUNITY)
Admission: RE | Disposition: A | Discharge status: Home / Self Care | Payer: Self-pay | Source: Ambulatory Visit | Attending: Urology

## 2012-10-08 ENCOUNTER — Encounter (HOSPITAL_COMMUNITY): Payer: Self-pay | Admitting: General Practice

## 2012-10-08 ENCOUNTER — Ambulatory Visit (HOSPITAL_COMMUNITY): Payer: Medicare Other

## 2012-10-08 ENCOUNTER — Ambulatory Visit (HOSPITAL_COMMUNITY)
Admission: RE | Admit: 2012-10-08 | Discharge: 2012-10-08 | Disposition: A | Discharge status: Home / Self Care | Payer: Medicare Other | Source: Ambulatory Visit | Attending: Urology | Admitting: Urology

## 2012-10-08 DIAGNOSIS — R82998 Other abnormal findings in urine: Secondary | ICD-10-CM | POA: Insufficient documentation

## 2012-10-08 DIAGNOSIS — K219 Gastro-esophageal reflux disease without esophagitis: Secondary | ICD-10-CM | POA: Insufficient documentation

## 2012-10-08 DIAGNOSIS — Z885 Allergy status to narcotic agent status: Secondary | ICD-10-CM | POA: Insufficient documentation

## 2012-10-08 DIAGNOSIS — N201 Calculus of ureter: Secondary | ICD-10-CM | POA: Insufficient documentation

## 2012-10-08 DIAGNOSIS — Z87440 Personal history of urinary (tract) infections: Secondary | ICD-10-CM | POA: Insufficient documentation

## 2012-10-08 DIAGNOSIS — I10 Essential (primary) hypertension: Secondary | ICD-10-CM | POA: Insufficient documentation

## 2012-10-08 DIAGNOSIS — N200 Calculus of kidney: Secondary | ICD-10-CM | POA: Insufficient documentation

## 2012-10-08 DIAGNOSIS — I252 Old myocardial infarction: Secondary | ICD-10-CM | POA: Insufficient documentation

## 2012-10-08 DIAGNOSIS — I4891 Unspecified atrial fibrillation: Secondary | ICD-10-CM | POA: Insufficient documentation

## 2012-10-08 DIAGNOSIS — Z79899 Other long term (current) drug therapy: Secondary | ICD-10-CM | POA: Insufficient documentation

## 2012-10-08 DIAGNOSIS — Z7982 Long term (current) use of aspirin: Secondary | ICD-10-CM | POA: Insufficient documentation

## 2012-10-08 SURGERY — LITHOTRIPSY, ESWL
Anesthesia: LOCAL | Laterality: Right

## 2012-10-08 MED ORDER — DIAZEPAM 5 MG PO TABS
10.0000 mg | ORAL_TABLET | ORAL | Status: AC
  Administered 2012-10-08: 10 mg via ORAL
  Filled 2012-10-08: qty 2

## 2012-10-08 MED ORDER — DIPHENHYDRAMINE HCL 25 MG PO CAPS
25.0000 mg | ORAL_CAPSULE | ORAL | Status: AC
  Administered 2012-10-08: 25 mg via ORAL
  Filled 2012-10-08: qty 1

## 2012-10-08 MED ORDER — DEXTROSE-NACL 5-0.45 % IV SOLN
INTRAVENOUS | Status: DC
  Administered 2012-10-08: 10:00:00 via INTRAVENOUS

## 2012-10-08 MED ORDER — CIPROFLOXACIN HCL 500 MG PO TABS
500.0000 mg | ORAL_TABLET | ORAL | Status: AC
  Administered 2012-10-08: 500 mg via ORAL
  Filled 2012-10-08: qty 1

## 2012-10-08 NOTE — H&P (Signed)
hief Complaint  cc: Dr. Felipa Eth, Dr. Florene Glen   Reason For Visit  3 mo f/u & KUB   Active Problems Problems  1. Nephrolithiasis Of Both Kidneys 592.0 2. Pyuria 791.9 3. Ureteral Stone 592.1  History of Present Illness         67 YO female returns today for a 3 mo f/u & KUB for hx of bilateral kidney stones.  She is status post Lt  ESWL on 05/28/12.  She was last seen 06/11/12 & KUB was free of fragments along the ureter.  She does have some fragments in the LLP as well as a stable right LP stone.      Hx of urosepsis. Had emergent cystoureterscopy, L RPG, and placement of double J stent for impacted 7 mm left upper ureteral calculus by Dr. Gaynelle Arabian 04/07/12.     The pt has had multiple stones, and has seen Dr. Rosana Hoes ( ? Jeffie Pollock) in the distant past. She has never passed stones, but pain has always subsided. No reason ever given for her recurrent stone formation. Regular diet. Sodas: 1 Sprite/day ( none now). Water: 3 bottles/day. Family hx of stones: father, brother, with recurrent stones.   Past Medical History Problems  1. History of  Acute Renal Insufficiency 593.9 2. History of  Anxiety (Symptom) 300.00 3. History of  Atrial Fibrillation 427.31 4. History of  Atrial Fibrillation 427.31 5. History of  Esophageal Reflux 530.81 6. History of  Esophageal Reflux 530.81 7. History of  Glaucoma 365.9 8. History of  Hypertension 401.9 9. History of  Hypertension 401.9 10. History of  Migraine Headache 346.90 11. History of  Nephrolithiasis V13.01 12. History of  Pneumonia V12.61 13. History of  Septicemia 038.9 14. History of  ST Elevation Myocardial Infarction 410.90  Surgical History Problems  1. History of  Cholecystectomy 2. History of  Cholecystectomy 3. History of  Cystoscopy With Insertion Of Ureteral Stent Left 4. History of  Hysterectomy V45.77 5. History of  Hysterectomy V45.77 6. History of  Knee Surgery 7. History of  Lithotripsy 8. History of   Lithotripsy  Current Meds 1. AmLODIPine Besylate 5 MG Oral Tablet; Therapy: (Recorded:02Apr2014) to 2. Aspirin 81 MG Oral Tablet; Therapy: (Recorded:21Nov2013) to 3. Calcium 500 TABS; Therapy: (Recorded:02Apr2014) to 4. Claritin 10 MG Oral Capsule; Therapy: (Recorded:21Nov2013) to 5. Lisinopril 5 MG Oral Tablet; Therapy: [id number] to 6. Lumigan 0.01 % Ophthalmic Solution; Therapy: (Recorded:21Nov2013) to 7. Metoprolol Tartrate 25 MG Oral Tablet; Therapy: (Recorded:02Apr2014) to 8. Multiple Vitamin TABS; Therapy: (Recorded:21Nov2013) to 9. Zantac 150 MG CAPS; Therapy: (Recorded:02Apr2014) to  Allergies Medication  1. Vicodin TABS  Family History Problems  1. Family history of  Death In The Family Father Died age 64- pancreatic cancer 2. Family history of  Death In The Family Mother Died age 3-Dementia 68. Paternal history of  Nephrolithiasis 4. Fraternal history of  Nephrolithiasis  Social History Problems  1. Marital History - Widowed 2. 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 History of  Alcohol Use 5. History of  Caffeine Use 6. History of  Tobacco Use 305.1  Review of Systems Genitourinary, constitutional, skin, eye, otolaryngeal, hematologic/lymphatic, cardiovascular, pulmonary, endocrine, musculoskeletal, gastrointestinal, neurological and psychiatric system(s) were reviewed and pertinent findings if present are noted.  Genitourinary: hematuria , vaginal burning associated with a yeast infection, and vaginal dryness.    Vitals Vital Signs [Data Includes: Last 1 Day]  02Apr2014 10:20AM  Blood Pressure: 143 / 84 Temperature: 97.6 F Heart Rate: 79  Physical Exam Abdomen: The abdomen is soft and nontender. No masses are palpated. No CVA tenderness. No hernias are palpable. No hepatosplenomegaly noted.    Results/Data Urine [Data Includes: Last 1 Day]   02Apr2014  COLOR YELLOW   APPEARANCE CLOUDY   SPECIFIC GRAVITY 1.025   pH 6.0   GLUCOSE  NEG mg/dL  BILIRUBIN NEG   KETONE NEG mg/dL  BLOOD LARGE   PROTEIN 30 mg/dL  UROBILINOGEN 0.2 mg/dL  NITRITE NEG   LEUKOCYTE ESTERASE LARGE   SQUAMOUS EPITHELIAL/HPF RARE   WBC TNTC WBC/hpf  RBC 11-20 RBC/hpf  BACTERIA FEW   CRYSTALS NONE SEEN   CASTS NONE SEEN   Selected Results  AU CT-STONE PROTOCOL [id number] 12:00AM Carolan Clines   Test Name Result Flag Reference  ** RADIOLOGY REPORT BY [facility] **   *RADIOLOGY REPORT*  Clinical Data: Microhematuria. Back pain  CT ABDOMEN AND PELVIS WITHOUT CONTRAST (URINARY CALCULUS PROTOCOL)  Technique: Multidetector CT imaging was performed through the abdomen and pelvis without intravenous contrast to include the urinary tract.  Comparison: 04/04/2012  Findings:  No focal liver abnormalities. Previous cholecystectomy. No biliary dilatation. The pancreas is normal. Visualized portions of the spleen appear normal.  Normal appearance of the adrenal glands. 4.2 mm nonobstructing calculus is noted within the inferior pole the right kidney, image 23. Within the inferior pole of the left kidney there is a 3 mm stone, image 23. There has been interval placement of a left-sided ureteral stent with decompression of previous left hydronephrosis. The stent appears to be in good position. There are no stones along the course of the left ureter. No bladder calculi identified.  Large hiatal hernia is present with approximately 70% intrathoracic stomach. The small bowel loops have a normal caliber. The appendix is visualized and appears normal. Multiple distal colonic diverticula identified without acute inflammation.  No free fluid or fluid collections identified within the abdomen or pelvis.  A periumbilical hernia is identified, image 39, which contains fat only. Within the ventral pelvic wall there is a large, complex hernia which contains fat only. This is best seen on image 64.  Review of the visualized  osseous structures is significant for mild multilevel degenerative disc disease. This is most severe at the L1-2 and L2-3 level. A first-degree anterolisthesis of L2-3 is noted.  IMPRESSION:  1. Interval placement of a left-sided Nephro ureteral stent with decompression left-sided hydronephrosis. The stent appears to be in good position. 2. Bilateral nonobstructing renal calculi. 3. Ventral abdominal wall hernias. 4. Hiatal hernia.   Original Report Authenticated By: Kerby Moors, M.D.   Procedure  KUB: R lower pole stone: (4.93mm by CT in November) , again seen . Normal gas pattern. No masses. Normal bone structure.     Assessment Assessed  1. Nephrolithiasis Of Both Kidneys 592.0      R renal calculus , 90mm, although not well seen on KUB because of gas over R kidney today. Stone 4.45mm RLP stone on CT in November, 2013. Pt wants lithotripsy before she gets into trouble with her Right sided stone.    Hx of A. Fib, which resolved in hospital. No coumadin.   Plan Health Maintenance (V70.0)  1. UA With REFLEX  Done: 02Apr2014 09:58AM   lithotripsy of 4.74mm RLP stone.   Signatures Electronically signed by : Carolan Clines, M.D.; Sep 19 2012 10:44AM

## 2012-10-08 NOTE — Interval H&P Note (Signed)
History and Physical Interval Note:  10/08/2012 12:26 PM  [name]  has presented today for surgery, with the diagnosis of Right Lower Pole Stone  The various methods of treatment have been discussed with the patient and family. After consideration of risks, benefits and other options for treatment, the patient has consented to  Procedure(s) with comments: EXTRACORPOREAL SHOCK WAVE LITHOTRIPSY (ESWL) (Right) - 75 mins req for this case  [phone] home # [employer] GRP # [id number]   as a surgical intervention .  The patient's history has been reviewed, patient examined, no change in status, stable for surgery.  I have reviewed the patient's chart and labs.  Questions were answered to the patient's satisfaction.     Carolan Clines I

## 2013-07-10 ENCOUNTER — Other Ambulatory Visit: Payer: Self-pay | Admitting: Interventional Cardiology

## 2013-07-11 ENCOUNTER — Other Ambulatory Visit: Payer: Self-pay | Admitting: Cardiology

## 2013-07-11 MED ORDER — METOPROLOL TARTRATE 25 MG PO TABS: ORAL_TABLET | ORAL | Status: DC

## 2013-07-11 NOTE — Addendum Note (Signed)
Addended byUlla Potash H on: 07/11/2013 07:59 AM   Modules accepted: Orders

## 2013-08-10 ENCOUNTER — Encounter: Payer: Self-pay | Admitting: Interventional Cardiology

## 2013-08-14 ENCOUNTER — Ambulatory Visit (INDEPENDENT_AMBULATORY_CARE_PROVIDER_SITE_OTHER): Payer: Medicare Other | Admitting: Interventional Cardiology

## 2013-08-14 ENCOUNTER — Encounter: Payer: Self-pay | Admitting: Interventional Cardiology

## 2013-08-14 VITALS — BP 132/88 | HR 89 | Ht 64.0 in | Wt 208.0 lb

## 2013-08-14 DIAGNOSIS — R9430 Abnormal result of cardiovascular function study, unspecified: Secondary | ICD-10-CM

## 2013-08-14 DIAGNOSIS — I428 Other cardiomyopathies: Secondary | ICD-10-CM

## 2013-08-14 DIAGNOSIS — N189 Chronic kidney disease, unspecified: Secondary | ICD-10-CM

## 2013-08-14 DIAGNOSIS — I4891 Unspecified atrial fibrillation: Secondary | ICD-10-CM

## 2013-08-14 DIAGNOSIS — I10 Essential (primary) hypertension: Secondary | ICD-10-CM

## 2013-08-14 MED ORDER — METOPROLOL TARTRATE 25 MG PO TABS: ORAL_TABLET | ORAL | Status: DC

## 2013-08-14 NOTE — Progress Notes (Signed)
Patient ID: [name], female   DOB: [birth year], 68 y.o.   MRN: [id number]    [facility], [address] [place], [place]  [address] Phone: [phone] Fax:  [phone]  Date:  08/14/2013   ID:  [name], DOB July 14, 1945, MRN [id number]  PCP:  [name], MD      History of Present Illness: [name] is a 68 y.o. female who had sepsis and LV dysfunction related to this in 2013. SHe had a Type II MI at the time. She also had transient AFib as well. She had issues with poor circulation in her toes. EF was severely decreased but returned to normal. SInce leaving the hospital, she has been doing therapy. She walks around the track.  Infection likely came from a kidney stone and she required a stent in her ureter.  She had a positive nuclear study but subsequent to this, had a normal cath.  Renal insufficiency has improved since the last visit. 12/14: 1.72.  She has not been walking regularly.  No CP, SHOB.  No palpitations.    Wt Readings from Last 3 Encounters:  08/14/13 208 lb (94.348 kg)  10/08/12 199 lb 4 oz (90.379 kg)  10/08/12 199 lb 4 oz (90.379 kg)     Past Medical History  Diagnosis Date  . Hypertension   . Pneumonia   . GERD (gastroesophageal reflux disease)   . Glaucoma   . H/O renal calculi 2002 & 2006  . H/O hiatal hernia   . Elevated cholesterol   . Anxiety Oct 2013    No treatment needed  . Septic shock due to Escherichia coli 10/16 2013    Hosp X 21 days multiple meds, on ventilator, medically induced comaX 1 wk  . Dysrhythmia     Arial Fibrillation  . Heart murmur Oct 2013    A fib  . Headache(784.0)     migraine    Current Outpatient Prescriptions  Medication Sig Dispense Refill  . amLODipine (NORVASC) 5 MG tablet Take 5 mg by mouth every evening.      Marland Kitchen aspirin EC 81 MG tablet Take 81 mg by mouth every morning.      . bimatoprost (LUMIGAN) 0.01 % SOLN Place 1 drop into both eyes at bedtime.      .  Calcium-Magnesium-Vitamin D (CALCIUM 500 PO) Take 500 mg by mouth daily.      . metoprolol tartrate (LOPRESSOR) 25 MG tablet TAKE 1 TABLET BY MOUTH TWICE DAILY  30 tablet  0  . Multiple Vitamin (MULTIVITAMIN WITH MINERALS) TABS Take 1 tablet by mouth daily.      . ranitidine (ZANTAC) 150 MG tablet Take 150 mg by mouth 2 (two) times daily.      Marland Kitchen zinc gluconate 50 MG tablet Take 50 mg by mouth daily as needed (for zinc deficiency).        No current facility-administered medications for this visit.    Allergies:    Allergies  Allergen Reactions  . Percocet [Oxycodone-Acetaminophen] Nausea And Vomiting  . Vicodin [Hydrocodone-Acetaminophen] Nausea And Vomiting    Social History:  The patient  reports that she has never smoked. She has never used smokeless tobacco. She reports that she does not drink alcohol or use illicit drugs.   Family History:  The patient's family history includes Bladder Cancer in her maternal grandfather; Cancer (age of onset: 22) in her father; Cancer (age of onset: 49) in her mother; Dementia in her mother;  Diabetes in her brother; Heart failure in her paternal grandmother; Hypertension in her brother and mother.   ROS:  Please see the history of present illness.  No nausea, vomiting.  No fevers, chills.  No focal weakness.  No dysuria.    All other systems reviewed and negative.   PHYSICAL EXAM: VS:  BP 132/88  Pulse 89  Ht 5\' 4"  (1.626 m)  Wt 208 lb (94.348 kg)  BMI 35.69 kg/m2 Well nourished, well developed, in no acute distress HEENT: normal Neck: no JVD, no carotid bruits Cardiac:  normal S1, S2; RRR;  Lungs:  clear to auscultation bilaterally, no wheezing, rhonchi or rales Abd: soft, nontender, no hepatomegaly Ext: no edema Skin: warm and dry Neuro:   no focal abnormalities noted  EKG:  NSR, NSST    ASSESSMENT AND PLAN:  1. Atrial fibrillation  Stopped Amiodarone HCl Tablet, 200 MG, 1 tablet, Orally, Once a day in 2/14 Continue Metoprolol  Tartrate Tablet, 25 MG, 1 tablet, Orally, Twice a day, 30 day(s), 60, Refills 11 Clean cath Now in NSR. AFib was likely related to stress of illness.  2. Essential hypertension, benign  Beta blocker added. Check BP outside of the doctor's office. If it is 130/80, would increase BP lowering therapy. WOuld have to be careful of renal function.  3. Acute myocardial infarction, unspecified site, subsequent episode of care  Type II in the setting of sepsis. Clean cath in 2/14 4. Cardiomyopathy  Related to sepsis. Resolved by most recent echo. Likely related to sepsis in 2013. Eval for CAD was negative.   Signed, Mina Marble, MD, [facility] 08/14/2013 12:35 PM

## 2013-08-14 NOTE — Patient Instructions (Addendum)
Refilled Metoprolol.  Your physician has requested that you regularly monitor and record your blood pressure readings at home. Please use the same machine at the same time of day to check your readings and record them to bring to your follow-up visit. Call us if BP runs consistently above130/85.   Your physician wants you to follow-up in: 1 year with Dr. Irish Lack.  You will receive a reminder letter in the mail two months in advance. If you don't receive a letter, please call our office to schedule the follow-up appointment.

## 2013-08-21 ENCOUNTER — Other Ambulatory Visit: Payer: Self-pay

## 2013-08-21 MED ORDER — METOPROLOL TARTRATE 25 MG PO TABS: ORAL_TABLET | ORAL | Status: DC

## 2013-09-16 ENCOUNTER — Encounter: Payer: Self-pay | Admitting: Nurse Practitioner

## 2013-09-16 ENCOUNTER — Ambulatory Visit (INDEPENDENT_AMBULATORY_CARE_PROVIDER_SITE_OTHER): Payer: Medicare Other | Admitting: Nurse Practitioner

## 2013-09-16 VITALS — BP 140/92 | HR 72 | Ht 63.75 in | Wt 212.0 lb

## 2013-09-16 DIAGNOSIS — R82998 Other abnormal findings in urine: Secondary | ICD-10-CM

## 2013-09-16 DIAGNOSIS — Z0000 Encounter for general adult medical examination without abnormal findings: Secondary | ICD-10-CM

## 2013-09-16 DIAGNOSIS — Z01419 Encounter for gynecological examination (general) (routine) without abnormal findings: Secondary | ICD-10-CM

## 2013-09-16 DIAGNOSIS — E559 Vitamin D deficiency, unspecified: Secondary | ICD-10-CM

## 2013-09-16 DIAGNOSIS — R8290 Unspecified abnormal findings in urine: Secondary | ICD-10-CM

## 2013-09-16 LAB — HEMOGLOBIN, FINGERSTICK: Hemoglobin, fingerstick: 13.6 g/dL (ref 12.0–16.0)

## 2013-09-16 LAB — POCT URINALYSIS DIPSTICK
Bilirubin, UA: NEGATIVE
Glucose, UA: NEGATIVE
Ketones, UA: NEGATIVE
Nitrite, UA: NEGATIVE
Urobilinogen, UA: NEGATIVE
pH, UA: 5

## 2013-09-16 MED ORDER — SULFAMETHOXAZOLE-TMP DS 800-160 MG PO TABS: 1.0000 | ORAL_TABLET | Freq: Two times a day (BID) | ORAL | Status: DC

## 2013-09-16 MED ORDER — FLUCONAZOLE 150 MG PO TABS: 150.0000 mg | ORAL_TABLET | Freq: Once | ORAL | Status: DC

## 2013-09-16 NOTE — Progress Notes (Signed)
Patient ID: [name], female   DOB: [birth year], 68 y.o.   MRN: [id number] 67 y.o. G1P1001 Widowed Caucasian Fe here for annual exam.  She has had 2 lithotripsy since last year in January and April 2014. She has had UTI symptoms since and has been treated with multiple antibiotics.  Gets better with med's and then back again. Most recent;ly has noted an increase in vulvar and perianal itching.   Last antibiotic was end of Feb. She is scheduled for a repeat CT scan in June. She is not dating or SA.  Patient's last menstrual period was 01/19/1992.          Sexually active: no  The current method of family planning is abstinence and status post hysterectomy.    Exercising: no  The patient does not participate in regular exercise at present. Smoker:  no  Health Maintenance: Pap:  07/31/07, WNL MMG:  09/02/13, 3D, negative Colonoscopy:  01/18/10, negative, repeat 5 years BMD:  1/13, 0.2/-1.7/-1.3 TDaP:  2013 with PCP Labs:  HB:  13.6 Urine:  Large RBC, 3+ leuk's, trace protein    reports that she has never smoked. She has never used smokeless tobacco. She reports that she does not drink alcohol or use illicit drugs.  Past Medical History  Diagnosis Date  . Hypertension   . Pneumonia   . GERD (gastroesophageal reflux disease)   . Glaucoma   . H/O renal calculi 2002 & 2006  . H/O hiatal hernia   . Elevated cholesterol   . Anxiety Oct 2013    No treatment needed  . Septic shock due to Escherichia coli 10/16 2013    Hosp X 21 days multiple meds, on ventilator, medically induced comaX 1 wk  . Dysrhythmia     Arial Fibrillation  . Heart murmur Oct 2013    A fib  . Headache(784.0)     migraine    Past Surgical History  Procedure Laterality Date  . Cholecystectomy    . Cystoscopy w/ ureteral stent placement  04/04/2012    Procedure: CYSTOSCOPY WITH RETROGRADE PYELOGRAM/URETERAL STENT PLACEMENT;  Surgeon: Ailene Rud, MD;  Location: [facility];  Service: Urology;  Laterality: Left;   . Orthopedic surgery  2010    left knee orthoscopy  . Total abdominal hysterectomy w/ bilateral salpingoophorectomy  1993    secondary to fibroids  . Abdominal hysterectomy    . Cardiac catheterization  07/2012    normal  . Breast biopsy Left 08/23/07    benign fibrocystic with duct ectasia    Current Outpatient Prescriptions  Medication Sig Dispense Refill  . amLODipine (NORVASC) 5 MG tablet Take 5 mg by mouth every evening.      Marland Kitchen aspirin EC 81 MG tablet Take 81 mg by mouth every morning.      . bimatoprost (LUMIGAN) 0.01 % SOLN Place 1 drop into both eyes at bedtime.      . Calcium-Magnesium-Vitamin D (CALCIUM 500 PO) Take 500 mg by mouth daily.      . fluticasone (FLONASE) 50 MCG/ACT nasal spray as needed.      . metoprolol tartrate (LOPRESSOR) 25 MG tablet TAKE 1 TABLET BY MOUTH TWICE DAILY  60 tablet  11  . Multiple Vitamin (MULTIVITAMIN WITH MINERALS) TABS Take 1 tablet by mouth daily.      . ranitidine (ZANTAC) 150 MG tablet Take 150 mg by mouth 2 (two) times daily.      Marland Kitchen zinc gluconate 50 MG tablet Take 50 mg  by mouth daily as needed (for zinc deficiency).       . fluconazole (DIFLUCAN) 150 MG tablet Take 1 tablet (150 mg total) by mouth once. Take one tablet.  Repeat in 48 hours if symptoms are not completely resolved.  2 tablet  1  . sulfamethoxazole-trimethoprim (BACTRIM DS) 800-160 MG per tablet Take 1 tablet by mouth 2 (two) times daily.  14 tablet  1   No current facility-administered medications for this visit.    Family History  Problem Relation Age of Onset  . Hypertension Mother   . Cancer Mother 24    breast  . Dementia Mother   . Hypertension Brother   . Diabetes Brother   . Cancer Father 65    pancreatic  . Heart failure Paternal Grandmother   . Bladder Cancer Maternal Grandfather     ROS:  Pertinent items are noted in HPI.  Otherwise, a comprehensive ROS was negative.  Exam:   BP 140/92  Pulse 72  Ht 5' 3.75" (1.619 m)  Wt 212 lb (96.163 kg)   BMI 36.69 kg/m2  LMP 01/19/1992 Height: 5' 3.75" (161.9 cm)  Ht Readings from Last 3 Encounters:  09/16/13 5' 3.75" (1.619 m)  08/14/13 5\' 4"  (1.626 m)  10/08/12 5\' 4"  (1.626 m)    General appearance: alert, cooperative and appears stated age Head: Normocephalic, without obvious abnormality, atraumatic Neck: no adenopathy, supple, symmetrical, trachea midline and thyroid normal to inspection and palpation Lungs: clear to auscultation bilaterally Breasts: normal appearance, no masses or tenderness Heart: regular rate and rhythm Abdomen: soft, non-tender; no masses,  no organomegaly, no flank pain Extremities: extremities normal, atraumatic, no cyanosis or edema Skin: Skin color, texture, turgor normal. No rashes or lesions Lymph nodes: Cervical, supraclavicular, and axillary nodes normal. No abnormal inguinal nodes palpated Neurologic: Grossly normal   Pelvic: External genitalia: multiple lesions consistent with external yeast vaginitis              Urethra:  normal appearing urethra with no masses, tenderness or lesions              Bartholin's and Skene's: normal                 Vagina: atrophic appearing vagina with pale color and no discharge, no lesions              Cervix: absent              Pap taken: no Bimanual Exam:  Uterus:  uterus absent              Adnexa: no mass, fullness, tenderness               Rectovaginal: Confirms               Anus:  normal sphincter tone, no lesions  A:  Well Woman with normal exam  Postmenopausal on ERT 1993 - fall 2014  S/P  TAH/ BSO secondary to fibroids 1993  History of renal calculi with Lithotripsy X 2  R/O UTI  Yeast vulva vaginitis  HTN  P:   Pap smear as per guidelines Not done  Rx for Diflucan X 2 tabs and repeat X 1  Septra DS BID for a week and follow with urine cultures  Will recheck Vit D - on OTC dose now  Counseled on breast self exam, mammography screening, adequate intake of calcium and vitamin D, diet and  exercise return annually or prn  An After  Visit Summary was printed and given to the patient.

## 2013-09-16 NOTE — Patient Instructions (Signed)
EXERCISE AND DIET:  We recommended that you start or continue a regular exercise program for good health. Regular exercise means any activity that makes your heart beat faster and makes you sweat.  We recommend exercising at least 30 minutes per day at least 3 days a week, preferably 4 or 5.  We also recommend a diet low in fat and sugar.  Inactivity, poor dietary choices and obesity can cause diabetes, heart attack, stroke, and kidney damage, among others.    ALCOHOL AND SMOKING:  Women should limit their alcohol intake to no more than 7 drinks/beers/glasses of wine (combined, not each!) per week. Moderation of alcohol intake to this level decreases your risk of breast cancer and liver damage. And of course, no recreational drugs are part of a healthy lifestyle.  And absolutely no smoking or even second hand smoke. Most people know smoking can cause heart and lung diseases, but did you know it also contributes to weakening of your bones? Aging of your skin?  Yellowing of your teeth and nails?  CALCIUM AND VITAMIN D:  Adequate intake of calcium and Vitamin D are recommended.  The recommendations for exact amounts of these supplements seem to change often, but generally speaking 600 mg of calcium (either carbonate or citrate) and 800 units of Vitamin D per day seems prudent. Certain women may benefit from higher intake of Vitamin D.  If you are among these women, your doctor will have told you during your visit.    PAP SMEARS:  Pap smears, to check for cervical cancer or precancers,  have traditionally been done yearly, although recent scientific advances have shown that most women can have pap smears less often.  However, every woman still should have a physical exam from her gynecologist every year. It will include a breast check, inspection of the vulva and vagina to check for abnormal growths or skin changes, a visual exam of the cervix, and then an exam to evaluate the size and shape of the uterus and  ovaries.  And after 69 years of age, a rectal exam is indicated to check for rectal cancers. We will also provide age appropriate advice regarding health maintenance, like when you should have certain vaccines, screening for sexually transmitted diseases, bone density testing, colonoscopy, mammograms, etc.   MAMMOGRAMS:  All women over 40 years old should have a yearly mammogram. Many facilities now offer a "3D" mammogram, which may cost around $50 extra out of pocket. If possible,  we recommend you accept the option to have the 3D mammogram performed.  It both reduces the number of women who will be called back for extra views which then turn out to be normal, and it is better than the routine mammogram at detecting truly abnormal areas.    COLONOSCOPY:  Colonoscopy to screen for colon cancer is recommended for all women at age 50.  We know, you hate the idea of the prep.  We agree, BUT, having colon cancer and not knowing it is worse!!  Colon cancer so often starts as a polyp that can be seen and removed at colonscopy, which can quite literally save your life!  And if your first colonoscopy is normal and you have no family history of colon cancer, most women don't have to have it again for 10 years.  Once every ten years, you can do something that may end up saving your life, right?  We will be happy to help you get it scheduled when you are ready.    Be sure to check your insurance coverage so you understand how much it will cost.  It may be covered as a preventative service at no cost, but you should check your particular policy.      

## 2013-09-17 LAB — URINALYSIS, MICROSCOPIC ONLY
Bacteria, UA: NONE SEEN
Casts: NONE SEEN
Crystals: NONE SEEN
RBC / HPF: >50 RBC/hpf — AB (ref <3)
Squamous Epithelial / LPF: NONE SEEN

## 2013-09-17 LAB — URINE CULTURE: Colony Count: 10000

## 2013-09-17 LAB — VITAMIN D 25 HYDROXY (VIT D DEFICIENCY, FRACTURES): Vit D, 25-Hydroxy: 52 ng/mL (ref 30–89)

## 2013-09-17 NOTE — Progress Notes (Signed)
Encounter reviewed by Dr. Brook Silva.  

## 2013-09-18 ENCOUNTER — Other Ambulatory Visit: Payer: Self-pay | Admitting: Certified Nurse Midwife

## 2013-09-18 DIAGNOSIS — N390 Urinary tract infection, site not specified: Secondary | ICD-10-CM

## 2013-09-19 ENCOUNTER — Telehealth: Payer: Self-pay | Admitting: *Deleted

## 2013-09-19 NOTE — Telephone Encounter (Signed)
Message copied by Graylon Good on Thu Sep 19, 2013  1:37 PM ------      Message from: GRUBB, PATRICIA R      Created: Thu Sep 19, 2013  8:53 AM       Her Vit D is in normal range. She is doing well on OTC dosing. ------

## 2013-09-19 NOTE — Telephone Encounter (Signed)
I have attempted to contact this patient by phone with the following results: left message to return my call on answering machine (home).  

## 2013-09-19 NOTE — Telephone Encounter (Signed)
Pt notified in result note.  

## 2013-10-03 ENCOUNTER — Ambulatory Visit (INDEPENDENT_AMBULATORY_CARE_PROVIDER_SITE_OTHER): Payer: Medicare Other | Admitting: *Deleted

## 2013-10-03 VITALS — BP 112/66 | HR 72 | Resp 18 | Ht 63.75 in | Wt 215.0 lb

## 2013-10-03 DIAGNOSIS — N390 Urinary tract infection, site not specified: Secondary | ICD-10-CM

## 2013-10-03 NOTE — Progress Notes (Signed)
Patient in today for [name] Micro. Patient states she doesn't have any Sx of UTI. Patient states no concerns or questions.

## 2013-10-04 ENCOUNTER — Telehealth: Payer: Self-pay | Admitting: *Deleted

## 2013-10-04 LAB — URINALYSIS, MICROSCOPIC ONLY
Bacteria, UA: NONE SEEN
Casts: NONE SEEN
RBC / HPF: >50 RBC/hpf — AB (ref <3)
Squamous Epithelial / LPF: NONE SEEN

## 2013-10-04 NOTE — Telephone Encounter (Signed)
I have attempted to contact this patient by phone with the following results: left message to return my call on answering machine (home per [facility]).  Advised pt I would not be in the office this afternoon.  If she returns call today to ask for [name] or [name] and they can give her details or I would talk to her on Monday.

## 2013-10-04 NOTE — Telephone Encounter (Signed)
Message copied by Graylon Good on Fri Oct 04, 2013 12:34 PM ------      Message from: Regina Eck      Created: Fri Oct 04, 2013 12:06 PM       Notify patient that urine micro still abnormal with calcium oxalate crystals now which could indicate stone presence. Needs to call urologist and been seen asap. Please send urine report to urology . ------

## 2013-10-07 NOTE — Telephone Encounter (Signed)
Pt returning call

## 2013-10-07 NOTE — Telephone Encounter (Signed)
Pt notified in result note.  Please see that note for follow up details.

## 2014-04-21 ENCOUNTER — Encounter: Payer: Self-pay | Admitting: Nurse Practitioner

## 2014-08-04 ENCOUNTER — Other Ambulatory Visit: Payer: Self-pay | Admitting: Nurse Practitioner

## 2014-08-04 MED ORDER — METOPROLOL TARTRATE 25 MG PO TABS: ORAL_TABLET | ORAL | Status: DC

## 2014-09-18 ENCOUNTER — Ambulatory Visit (INDEPENDENT_AMBULATORY_CARE_PROVIDER_SITE_OTHER): Payer: Medicare Other | Admitting: Nurse Practitioner

## 2014-09-18 ENCOUNTER — Encounter: Payer: Self-pay | Admitting: Nurse Practitioner

## 2014-09-18 VITALS — BP 110/66 | HR 64 | Ht 63.25 in | Wt 221.0 lb

## 2014-09-18 DIAGNOSIS — B356 Tinea cruris: Secondary | ICD-10-CM | POA: Diagnosis not present

## 2014-09-18 DIAGNOSIS — Z0000 Encounter for general adult medical examination without abnormal findings: Secondary | ICD-10-CM | POA: Diagnosis not present

## 2014-09-18 DIAGNOSIS — R350 Frequency of micturition: Secondary | ICD-10-CM

## 2014-09-18 DIAGNOSIS — Z01419 Encounter for gynecological examination (general) (routine) without abnormal findings: Secondary | ICD-10-CM

## 2014-09-18 LAB — POCT URINALYSIS DIPSTICK
Bilirubin, UA: NEGATIVE
Glucose, UA: NEGATIVE
Ketones, UA: NEGATIVE
Nitrite, UA: NEGATIVE
Urobilinogen, UA: NEGATIVE
pH, UA: 5.5

## 2014-09-18 MED ORDER — NYSTATIN 100000 UNIT/GM EX OINT: 1.0000 "application " | TOPICAL_OINTMENT | Freq: Two times a day (BID) | CUTANEOUS | Status: DC

## 2014-09-18 MED ORDER — FLUCONAZOLE 150 MG PO TABS: ORAL_TABLET | ORAL | Status: DC

## 2014-09-18 NOTE — Progress Notes (Signed)
Patient ID: [name], female   DOB: 09/30/1945, 69 y.o.   MRN: [id number] 69 y.o. G54P1001 Widowed  Caucasian Fe here for annual exam.  She has had history of frequent  UTI ad urosepsis years ago.  She has no symptoms now except for nocturia with slight urgency 2-3 times a night.   Denies any current symptoms and no fever or chills.  No long term antibiotics this past year.  CT scan did show a renal calculi bilaterally but very small.  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   Patient's last menstrual period was 01/19/1992.  Pt is status post hysterectomy. Sexually active: No.  The current method of family planning is abstinence and status post hysterectomy.    Exercising: Yes.    climbing stairs several times per week Smoker:  no  Health Maintenance: Pap:  07/31/07, negative  MMG:  09/02/13, Bi-Rads 1:  Negative Colonoscopy:  01/18/10, negative, repeat in 5 years - will schedule this year BMD:   06/2011, T-Score 0.2/-1.7/-1.3 TDaP:  2013 Shingles: 2014 Labs:  HB:  PCP, Dr. Felipa Eth  Urine:  Large RBC, large WBC, protein 2+   reports that she has never smoked. She has never used smokeless tobacco. She reports that she does not drink alcohol or use illicit drugs.  Past Medical History  Diagnosis Date  . Hypertension   . Pneumonia   . GERD (gastroesophageal reflux disease)   . Glaucoma   . H/O renal calculi 2002 & 2006  . H/O hiatal hernia   . Elevated cholesterol   . Anxiety Oct 2013    No treatment needed  . Septic shock due to Escherichia coli 10/16 2013    Hosp X 21 days multiple meds, on ventilator, medically induced comaX 1 wk  . Dysrhythmia     Arial Fibrillation  . Heart murmur Oct 2013    A fib  . Headache(784.0)     migraine    Past Surgical History  Procedure Laterality Date  . Cholecystectomy    . Cystoscopy w/ ureteral stent placement  04/04/2012    Procedure: CYSTOSCOPY WITH RETROGRADE PYELOGRAM/URETERAL STENT PLACEMENT;  Surgeon: Ailene Rud, MD;  Location: [facility];  Service: Urology;  Laterality: Left;  . Orthopedic surgery  2010    left knee orthoscopy  . Total abdominal hysterectomy w/ bilateral salpingoophorectomy  1993    secondary to fibroids  . Abdominal hysterectomy    . Cardiac catheterization  07/2012    normal  . Breast biopsy Left 08/23/07    benign fibrocystic with duct ectasia    Current Outpatient Prescriptions  Medication Sig Dispense Refill  . amLODipine (NORVASC) 5 MG tablet Take 5 mg by mouth every evening.    Marland Kitchen aspirin EC 81 MG tablet Take 81 mg by mouth every morning.    . bimatoprost (LUMIGAN) 0.01 % SOLN Place 1 drop into both eyes at bedtime.    . Calcium-Magnesium-Vitamin D (CALCIUM 500 PO) Take 500 mg by mouth daily.    . fluticasone (FLONASE) 50 MCG/ACT nasal spray as needed.    . metoprolol tartrate (LOPRESSOR) 25 MG tablet TAKE 1 TABLET BY MOUTH TWICE DAILY 60 tablet 1  . Multiple Vitamin (MULTIVITAMIN WITH MINERALS) TABS Take 1 tablet by mouth daily.    . ranitidine (ZANTAC) 150 MG tablet Take 150 mg by mouth 2 (two) times daily.    . timolol (BETIMOL) 0.5 % ophthalmic solution Place 1 drop into both eyes  daily.    . zinc gluconate 50 MG tablet Take 50 mg by mouth daily as needed (for zinc deficiency).     . fluconazole (DIFLUCAN) 150 MG tablet 1 tablet weekly X 4 4 tablet 1  . nystatin ointment (MYCOSTATIN) Apply 1 application topically 2 (two) times daily. 30 g 6   No current facility-administered medications for this visit.    Family History  Problem Relation Age of Onset  . Hypertension Mother   . Cancer Mother 21    breast  . Dementia Mother   . Hypertension Brother   . Diabetes Brother   . Cancer Father 73    pancreatic  . Heart failure Paternal Grandmother   . Bladder Cancer Maternal Grandfather     ROS:  Pertinent items are noted in HPI.  Otherwise, a comprehensive ROS was negative.  Exam:   BP 110/66 mmHg  Pulse 64  Ht 5' 3.25" (1.607 m)  Wt 221 lb (100.245  kg)  BMI 38.82 kg/m2  LMP 01/19/1992 Height: 5' 3.25" (160.7 cm) Ht Readings from Last 3 Encounters:  09/18/14 5' 3.25" (1.607 m)  10/03/13 5' 3.75" (1.619 m)  09/16/13 5' 3.75" (1.619 m)    General appearance: alert, cooperative and appears stated age Head: Normocephalic, without obvious abnormality, atraumatic Neck: no adenopathy, supple, symmetrical, trachea midline and thyroid normal to inspection and palpation Lungs: clear to auscultation bilaterally Breasts: normal appearance, no masses or tenderness between and under the breast has fungal infection. Heart: regular rate and rhythm Abdomen: soft, non-tender; no masses,  no organomegaly Extremities: extremities normal, atraumatic, no cyanosis or edema Skin: Skin color, texture, turgor normal. No rashes or lesions Lymph nodes: Cervical, supraclavicular, and axillary nodes normal. No abnormal inguinal nodes palpated Neurologic: Grossly normal   Pelvic: External genitalia:  Lesions consistent with yeast the entire vulvar areas. Skin is thickened with linear cuts. Also follows along the lines of pa wear.              Urethra:  normal appearing urethra with no masses, tenderness or lesions              Bartholin's and Skene's: normal                 Vagina: atrophic appearing vagina with pale color and no discharge, no lesions              Cervix: absent              Pap taken: No. Bimanual Exam:  Uterus:  uterus absent              Adnexa: no mass, fullness, tenderness               Rectovaginal: Confirms               Anus:  normal sphincter tone, no lesions       Chaperone present:  no  A:  Well Woman with normal exam  Postmenopausal on ERT 1993 - fall 2014 S/P TAH/ BSO secondary to fibroids 1993 History of renal calculi with Lithotripsy X 2 R/O UTI Yeast vulva vaginitis HTN  P:   Reviewed health and wellness pertinent to exam  Pap smear not taken  today  Mammogram is due now and will schedule  Did not start on antibiotics - pending urine C&S  Started  On Diflucan 150 mg weekly X 5  Started on Mycostatin cream BID prn.  If no resolution in 2 weeks to  return.  Counseled on breast self exam, mammography screening, adequate intake of calcium and vitamin D, diet and exercise, Kegel's exercises return annually or prn  An After Visit Summary was printed and given to the patient.

## 2014-09-18 NOTE — Progress Notes (Signed)
Encounter reviewed by Dr. Brook Silva.  

## 2014-09-18 NOTE — Patient Instructions (Addendum)

## 2014-09-19 LAB — URINALYSIS, MICROSCOPIC ONLY
Bacteria, UA: NONE SEEN
Casts: NONE SEEN
Crystals: NONE SEEN
Squamous Epithelial / LPF: NONE SEEN
WBC, UA: >50 WBC/hpf — AB (ref <3)

## 2014-09-19 LAB — URINE CULTURE

## 2014-10-06 ENCOUNTER — Other Ambulatory Visit: Payer: Self-pay | Admitting: Internal Medicine

## 2014-10-06 ENCOUNTER — Ambulatory Visit
Admission: RE | Admit: 2014-10-06 | Discharge: 2014-10-06 | Disposition: A | Discharge status: Home / Self Care | Payer: No Typology Code available for payment source | Source: Ambulatory Visit | Attending: Internal Medicine | Admitting: Internal Medicine

## 2014-10-06 DIAGNOSIS — R05 Cough: Secondary | ICD-10-CM

## 2014-10-06 DIAGNOSIS — R059 Cough, unspecified: Secondary | ICD-10-CM

## 2014-10-23 ENCOUNTER — Telehealth: Payer: Self-pay | Admitting: Nurse Practitioner

## 2014-10-23 NOTE — Telephone Encounter (Signed)
Please let pt. know the BMD results from 10/02/14.  The T Score at the spine is 0.5; left hip neck -1.6; right hip neck -2.0; left forearm -2.2.  The lowest range is the forearm and the right hip.  The FRAX score for major fracture in 10 years is 10.6% (goal is <20%); the FRAX score for hip fracture in 10 years is 1.9% (goal is < 3 % ).  Comparison study from 07/06/11 shows a decrease of the right hip of 4.5 %.  While some bone loss is normal and expectant she must maintain wight bearing exercise, walking, and calcium with Vit D.  Repeat BMD in 2 years.

## 2014-10-23 NOTE — Telephone Encounter (Signed)
Pt notified of results.  Pt voices understanding and is agreeable with plan.

## 2014-10-26 ENCOUNTER — Other Ambulatory Visit: Payer: Self-pay | Admitting: Nurse Practitioner

## 2014-10-27 ENCOUNTER — Emergency Department (HOSPITAL_COMMUNITY): Payer: Medicare Other

## 2014-10-27 ENCOUNTER — Other Ambulatory Visit: Payer: Self-pay | Admitting: Urology

## 2014-10-27 ENCOUNTER — Encounter (HOSPITAL_BASED_OUTPATIENT_CLINIC_OR_DEPARTMENT_OTHER): Payer: Self-pay | Admitting: *Deleted

## 2014-10-27 ENCOUNTER — Encounter (HOSPITAL_COMMUNITY): Payer: Self-pay | Admitting: Emergency Medicine

## 2014-10-27 ENCOUNTER — Emergency Department (HOSPITAL_COMMUNITY)
Admission: EM | Admit: 2014-10-27 | Discharge: 2014-10-27 | Disposition: A | Discharge status: Home / Self Care | Payer: Medicare Other | Attending: Emergency Medicine | Admitting: Emergency Medicine

## 2014-10-27 DIAGNOSIS — I10 Essential (primary) hypertension: Secondary | ICD-10-CM | POA: Insufficient documentation

## 2014-10-27 DIAGNOSIS — N202 Calculus of kidney with calculus of ureter: Secondary | ICD-10-CM | POA: Diagnosis not present

## 2014-10-27 DIAGNOSIS — E780 Pure hypercholesterolemia: Secondary | ICD-10-CM | POA: Diagnosis not present

## 2014-10-27 DIAGNOSIS — Z8701 Personal history of pneumonia (recurrent): Secondary | ICD-10-CM | POA: Diagnosis not present

## 2014-10-27 DIAGNOSIS — R319 Hematuria, unspecified: Secondary | ICD-10-CM

## 2014-10-27 DIAGNOSIS — G43909 Migraine, unspecified, not intractable, without status migrainosus: Secondary | ICD-10-CM | POA: Diagnosis not present

## 2014-10-27 DIAGNOSIS — N201 Calculus of ureter: Secondary | ICD-10-CM

## 2014-10-27 DIAGNOSIS — Z8619 Personal history of other infectious and parasitic diseases: Secondary | ICD-10-CM | POA: Diagnosis not present

## 2014-10-27 DIAGNOSIS — R011 Cardiac murmur, unspecified: Secondary | ICD-10-CM | POA: Insufficient documentation

## 2014-10-27 DIAGNOSIS — N200 Calculus of kidney: Secondary | ICD-10-CM

## 2014-10-27 DIAGNOSIS — Z8659 Personal history of other mental and behavioral disorders: Secondary | ICD-10-CM | POA: Insufficient documentation

## 2014-10-27 DIAGNOSIS — N390 Urinary tract infection, site not specified: Secondary | ICD-10-CM | POA: Diagnosis not present

## 2014-10-27 DIAGNOSIS — H409 Unspecified glaucoma: Secondary | ICD-10-CM | POA: Diagnosis not present

## 2014-10-27 DIAGNOSIS — R109 Unspecified abdominal pain: Secondary | ICD-10-CM | POA: Diagnosis present

## 2014-10-27 DIAGNOSIS — Z79899 Other long term (current) drug therapy: Secondary | ICD-10-CM | POA: Insufficient documentation

## 2014-10-27 DIAGNOSIS — K219 Gastro-esophageal reflux disease without esophagitis: Secondary | ICD-10-CM | POA: Diagnosis not present

## 2014-10-27 DIAGNOSIS — Z7982 Long term (current) use of aspirin: Secondary | ICD-10-CM | POA: Insufficient documentation

## 2014-10-27 LAB — CBC
HCT: 38.4 % (ref 36.0–46.0)
Hemoglobin: 12.5 g/dL (ref 12.0–15.0)
MCH: 28.5 pg (ref 26.0–34.0)
MCHC: 32.6 g/dL (ref 30.0–36.0)
MCV: 87.7 fL (ref 78.0–100.0)
Platelets: 269 10*3/uL (ref 150–400)
RBC: 4.38 MIL/uL (ref 3.87–5.11)
RDW: 15.3 % (ref 11.5–15.5)
WBC: 6.3 10*3/uL (ref 4.0–10.5)

## 2014-10-27 LAB — URINE MICROSCOPIC-ADD ON

## 2014-10-27 LAB — BASIC METABOLIC PANEL
Anion gap: 9 (ref 5–15)
BUN: 26 mg/dL — ABNORMAL HIGH (ref 6–20)
CO2: 25 mmol/L (ref 22–32)
Calcium: 8.9 mg/dL (ref 8.9–10.3)
Chloride: 104 mmol/L (ref 101–111)
Creatinine, Ser: 1.82 mg/dL — ABNORMAL HIGH (ref 0.44–1.00)
GFR calc Af Amer: 32 mL/min — ABNORMAL LOW (ref >60)
GFR calc non Af Amer: 27 mL/min — ABNORMAL LOW (ref >60)
Glucose, Bld: 93 mg/dL (ref 70–99)
Potassium: 3.9 mmol/L (ref 3.5–5.1)
Sodium: 138 mmol/L (ref 135–145)

## 2014-10-27 LAB — URINALYSIS, ROUTINE W REFLEX MICROSCOPIC
Bilirubin Urine: NEGATIVE
Glucose, UA: NEGATIVE mg/dL
Ketones, ur: 15 mg/dL — AB
Nitrite: NEGATIVE
Protein, ur: 100 mg/dL — AB
Specific Gravity, Urine: 1.013 (ref 1.005–1.030)
Urobilinogen, UA: 0.2 mg/dL (ref 0.0–1.0)
pH: 6 (ref 5.0–8.0)

## 2014-10-27 MED ORDER — ONDANSETRON HCL 4 MG/2ML IJ SOLN
4.0000 mg | Freq: Once | INTRAMUSCULAR | Status: AC
  Administered 2014-10-27: 4 mg via INTRAVENOUS
  Filled 2014-10-27: qty 2

## 2014-10-27 MED ORDER — HYDROMORPHONE HCL 1 MG/ML IJ SOLN
1.0000 mg | Freq: Once | INTRAMUSCULAR | Status: AC
  Administered 2014-10-27: 1 mg via INTRAVENOUS
  Filled 2014-10-27: qty 1

## 2014-10-27 MED ORDER — OXYCODONE-ACETAMINOPHEN 5-325 MG PO TABS: 1.0000 | ORAL_TABLET | Freq: Four times a day (QID) | ORAL | Status: DC | PRN

## 2014-10-27 MED ORDER — ONDANSETRON 4 MG PO TBDP: ORAL_TABLET | ORAL | Status: DC

## 2014-10-27 MED ORDER — CIPROFLOXACIN HCL 250 MG PO TABS: 250.0000 mg | ORAL_TABLET | Freq: Two times a day (BID) | ORAL | Status: DC

## 2014-10-27 NOTE — Discharge Instructions (Signed)
Take percocet for severe pain only. No driving or operating heavy machinery while taking percocet. This medication may cause drowsiness. Take zofran as directed as needed for nausea. Take cipro twice daily. Follow-up with [facility] today. They should be calling you today to schedule an appointment for this afternoon, if you do not hear from them by early this afternoon, call the office to be seen.  Kidney Stones Kidney stones (urolithiasis) are deposits that form inside your kidneys. The intense pain is caused by the stone moving through the urinary tract. When the stone moves, the ureter goes into spasm around the stone. The stone is usually passed in the urine.  CAUSES   A disorder that makes certain neck glands produce too much parathyroid hormone (primary hyperparathyroidism).  A buildup of uric acid crystals, similar to gout in your joints.  Narrowing (stricture) of the ureter.  A kidney obstruction present at birth (congenital obstruction).  Previous surgery on the kidney or ureters.  Numerous kidney infections. SYMPTOMS   Feeling sick to your stomach (nauseous).  Throwing up (vomiting).  Blood in the urine (hematuria).  Pain that usually spreads (radiates) to the groin.  Frequency or urgency of urination. DIAGNOSIS   Taking a history and physical exam.  Blood or urine tests.  CT scan.  Occasionally, an examination of the inside of the urinary bladder (cystoscopy) is performed. TREATMENT   Observation.  Increasing your fluid intake.  Extracorporeal shock wave lithotripsy--This is a noninvasive procedure that uses shock waves to break up kidney stones.  Surgery may be needed if you have severe pain or persistent obstruction. There are various surgical procedures. Most of the procedures are performed with the use of small instruments. Only small incisions are needed to accommodate these instruments, so recovery time is minimized. The size, location, and  chemical composition are all important variables that will determine the proper choice of action for you. Talk to your health care provider to better understand your situation so that you will minimize the risk of injury to yourself and your kidney.  HOME CARE INSTRUCTIONS   Drink enough water and fluids to keep your urine clear or pale yellow. This will help you to pass the stone or stone fragments.  Strain all urine through the provided strainer. Keep all particulate matter and stones for your health care provider to see. The stone causing the pain may be as small as a grain of salt. It is very important to use the strainer each and every time you pass your urine. The collection of your stone will allow your health care provider to analyze it and verify that a stone has actually passed. The stone analysis will often identify what you can do to reduce the incidence of recurrences.  Only take over-the-counter or prescription medicines for pain, discomfort, or fever as directed by your health care provider.  Make a follow-up appointment with your health care provider as directed.  Get follow-up X-rays if required. The absence of pain does not always mean that the stone has passed. It may have only stopped moving. If the urine remains completely obstructed, it can cause loss of kidney function or even complete destruction of the kidney. It is your responsibility to make sure X-rays and follow-ups are completed. Ultrasounds of the kidney can show blockages and the status of the kidney. Ultrasounds are not associated with any radiation and can be performed easily in a matter of minutes. SEEK MEDICAL CARE IF:  You experience pain that is  progressive and unresponsive to any pain medicine you have been prescribed. SEEK IMMEDIATE MEDICAL CARE IF:   Pain cannot be controlled with the prescribed medicine.  You have a fever or shaking chills.  The severity or intensity of pain increases over 18 hours and  is not relieved by pain medicine.  You develop a new onset of abdominal pain.  You feel faint or pass out.  You are unable to urinate. MAKE SURE YOU:   Understand these instructions.  Will watch your condition.  Will get help right away if you are not doing well or get worse. Document Released: 06/06/2005 Document Revised: 02/06/2013 Document Reviewed: 11/07/2012 [facility] Patient Information 2015 [place], [place]. This information is not intended to replace advice given to you by your health care provider. Make sure you discuss any questions you have with your health care provider.  Urinary Tract Infection Urinary tract infections (UTIs) can develop anywhere along your urinary tract. Your urinary tract is your body's drainage system for removing wastes and extra water. Your urinary tract includes two kidneys, two ureters, a bladder, and a urethra. Your kidneys are a pair of bean-shaped organs. Each kidney is about the size of your fist. They are located below your ribs, one on each side of your spine. CAUSES Infections are caused by microbes, which are microscopic organisms, including fungi, viruses, and bacteria. These organisms are so small that they can only be seen through a microscope. Bacteria are the microbes that most commonly cause UTIs. SYMPTOMS  Symptoms of UTIs may vary by age and gender of the patient and by the location of the infection. Symptoms in young women typically include a frequent and intense urge to urinate and a painful, burning feeling in the bladder or urethra during urination. Older women and men are more likely to be tired, shaky, and weak and have muscle aches and abdominal pain. A fever may mean the infection is in your kidneys. Other symptoms of a kidney infection include pain in your back or sides below the ribs, nausea, and vomiting. DIAGNOSIS To diagnose a UTI, your caregiver will ask you about your symptoms. Your caregiver also will ask to provide a urine  sample. The urine sample will be tested for bacteria and white blood cells. White blood cells are made by your body to help fight infection. TREATMENT  Typically, UTIs can be treated with medication. Because most UTIs are caused by a bacterial infection, they usually can be treated with the use of antibiotics. The choice of antibiotic and length of treatment depend on your symptoms and the type of bacteria causing your infection. HOME CARE INSTRUCTIONS  If you were prescribed antibiotics, take them exactly as your caregiver instructs you. Finish the medication even if you feel better after you have only taken some of the medication.  Drink enough water and fluids to keep your urine clear or pale yellow.  Avoid caffeine, tea, and carbonated beverages. They tend to irritate your bladder.  Empty your bladder often. Avoid holding urine for long periods of time.  Empty your bladder before and after sexual intercourse.  After a bowel movement, women should cleanse from front to back. Use each tissue only once. SEEK MEDICAL CARE IF:   You have back pain.  You develop a fever.  Your symptoms do not begin to resolve within 3 days. SEEK IMMEDIATE MEDICAL CARE IF:   You have severe back pain or lower abdominal pain.  You develop chills.  You have nausea or vomiting.  You have continued burning or discomfort with urination. MAKE SURE YOU:   Understand these instructions.  Will watch your condition.  Will get help right away if you are not doing well or get worse. Document Released: 03/16/2005 Document Revised: 12/06/2011 Document Reviewed: 07/15/2011 [facility] Patient Information 2015 [place], [place]. This information is not intended to replace advice given to you by your health care provider. Make sure you discuss any questions you have with your health care provider.  Ureteral Colic (Kidney Stones) Ureteral colic is the result of a condition when kidney stones form inside the kidney.  Once kidney stones are formed they may move into the tube that connects the kidney with the bladder (ureter). If this occurs, this condition may cause pain (colic) in the ureter.  CAUSES  Pain is caused by stone movement in the ureter and the obstruction caused by the stone. SYMPTOMS  The pain comes and goes as the ureter contracts around the stone. The pain is usually intense, sharp, and stabbing in character. The location of the pain may move as the stone moves through the ureter. When the stone is near the kidney the pain is usually located in the back and radiates to the belly (abdomen). When the stone is ready to pass into the bladder the pain is often located in the lower abdomen on the side the stone is located. At this location, the symptoms may mimic those of a urinary tract infection with urinary frequency. Once the stone is located here it often passes into the bladder and the pain disappears completely. TREATMENT   Your caregiver will provide you with medicine for pain relief.  You may require specialized follow-up X-rays.  The absence of pain does not always mean that the stone has passed. It may have just stopped moving. If the urine remains completely obstructed, it can cause loss of kidney function or even complete destruction of the involved kidney. It is your responsibility and in your interest that X-rays and follow-ups as suggested by your caregiver are completed. Relief of pain without passage of the stone can be associated with severe damage to the kidney, including loss of kidney function on that side.  If your stone does not pass on its own, additional measures may be taken by your caregiver to ensure its removal. HOME CARE INSTRUCTIONS   Increase your fluid intake. Water is the preferred fluid since juices containing vitamin C may acidify the urine making it less likely for certain stones (uric acid stones) to pass.  Strain all urine. A strainer will be provided. Keep all  particulate matter or stones for your caregiver to inspect.  Take your pain medicine as directed.  Make a follow-up appointment with your caregiver as directed.  Remember that the goal is passage of your stone. The absence of pain does not mean the stone is gone. Follow your caregiver's instructions.  Only take over-the-counter or prescription medicines for pain, discomfort, or fever as directed by your caregiver. SEEK MEDICAL CARE IF:   Pain cannot be controlled with the prescribed medicine.  You have a fever.  Pain continues for longer than your caregiver advises it should.  There is a change in the pain, and you develop chest discomfort or constant abdominal pain.  You feel faint or pass out. MAKE SURE YOU:   Understand these instructions.  Will watch your condition.  Will get help right away if you are not doing well or get worse. Document Released: 03/16/2005 Document Revised: 10/01/2012 Document  Reviewed: 12/01/2010 [employer] Patient Information 2015 [place], [place]. This information is not intended to replace advice given to you by your health care provider. Make sure you discuss any questions you have with your health care provider.

## 2014-10-27 NOTE — ED Notes (Signed)
Patient taken to CT.

## 2014-10-27 NOTE — Progress Notes (Signed)
NPO AFTER MN.  ARRIVE AT 0715.  NEEDS EKG HX ASSESSMENT UPDATE.  CURRENT LAB RESULTS IN CHART AND EPIC. WILL TAKE NORVASC AND ZANTAC AM DOS W/ SIPS OF WATER.

## 2014-10-27 NOTE — ED Provider Notes (Signed)
CSN: [id number]     Arrival date & time 10/27/14  0631 History   First MD Initiated Contact with Patient 10/27/14 [phone]     Chief Complaint  Patient presents with  . Nephrolithiasis     (Consider location/radiation/quality/duration/timing/severity/associated sxs/prior Treatment) HPI Comments: 69 year old female complaining of sudden onset right-sided flank pain beginning at 5:45 AM today. Pain is a constant, dull ache, 9/10, nonradiating. Yesterday, reports having a lot of blood in her urine throughout the day. Admits to nausea without vomiting. Denies fevers. One week ago completed antibiotic treatment for pneumonia and a urinary tract infection. She was supposed to be going to her PCP tomorrow for follow-up. Symptoms of pneumonia have completely resolved. Denies increased urinary frequency, dysuria. States she always has urgency. Reports this pain feels very similar to a kidney stone she had 3 years ago on the right side. Has not had an issue with kidney stones since.  The history is provided by the patient.    Past Medical History  Diagnosis Date  . Hypertension   . Pneumonia   . GERD (gastroesophageal reflux disease)   . Glaucoma   . H/O renal calculi 2002 & 2006  . H/O hiatal hernia   . Elevated cholesterol   . Anxiety Oct 2013    No treatment needed  . Septic shock due to Escherichia coli 10/16 2013    Hosp X 21 days multiple meds, on ventilator, medically induced comaX 1 wk  . Dysrhythmia     Arial Fibrillation  . Heart murmur Oct 2013    A fib  . Headache(784.0)     migraine   Past Surgical History  Procedure Laterality Date  . Cholecystectomy    . Cystoscopy w/ ureteral stent placement  04/04/2012    Procedure: CYSTOSCOPY WITH RETROGRADE PYELOGRAM/URETERAL STENT PLACEMENT;  Surgeon: Ailene Rud, MD;  Location: [facility];  Service: Urology;  Laterality: Left;  . Orthopedic surgery  2010    left knee orthoscopy  . Total abdominal hysterectomy w/ bilateral  salpingoophorectomy  1993    secondary to fibroids  . Abdominal hysterectomy    . Cardiac catheterization  07/2012    normal  . Breast biopsy Left 08/23/07    benign fibrocystic with duct ectasia   Family History  Problem Relation Age of Onset  . Hypertension Mother   . Cancer Mother 58    breast  . Dementia Mother   . Hypertension Brother   . Diabetes Brother   . Cancer Father 14    pancreatic  . Heart failure Paternal Grandmother   . Bladder Cancer Maternal Grandfather    History  Substance Use Topics  . Smoking status: Never Smoker   . Smokeless tobacco: Never Used  . Alcohol Use: No   OB History    Gravida Para Term Preterm AB TAB SAB Ectopic Multiple Living   1 1 1       1      Review of Systems  Genitourinary: Positive for urgency, hematuria and flank pain.  All other systems reviewed and are negative.     Allergies  Percocet and Vicodin  Home Medications   Prior to Admission medications   Medication Sig Start Date End Date Taking? Authorizing Provider  amLODipine (NORVASC) 5 MG tablet Take 5 mg by mouth every evening.   Yes Historical Provider, MD  aspirin EC 81 MG tablet Take 81 mg by mouth every morning.   Yes Historical Provider, MD  bimatoprost (LUMIGAN) 0.01 % SOLN Place  1 drop into both eyes at bedtime.   Yes Historical Provider, MD  Calcium-Magnesium-Vitamin D (CALCIUM 500 PO) Take 500 mg by mouth daily.   Yes Historical Provider, MD  fluticasone (FLONASE) 50 MCG/ACT nasal spray Place 2 sprays into both nostrils daily as needed for allergies or rhinitis.   Yes Historical Provider, MD  metoprolol tartrate (LOPRESSOR) 25 MG tablet TAKE 1 TABLET BY MOUTH TWICE DAILY 08/04/14  Yes Burtis Junes, NP  Multiple Vitamin (MULTIVITAMIN WITH MINERALS) TABS Take 1 tablet by mouth daily.   Yes Historical Provider, MD  ranitidine (ZANTAC) 150 MG tablet Take 150 mg by mouth 2 (two) times daily.   Yes Historical Provider, MD  timolol (BETIMOL) 0.5 % ophthalmic  solution Place 1 drop into both eyes daily.   Yes Historical Provider, MD  zinc gluconate 50 MG tablet Take 50 mg by mouth daily as needed (for zinc deficiency).    Yes Historical Provider, MD  ciprofloxacin (CIPRO) 250 MG tablet Take 1 tablet (250 mg total) by mouth 2 (two) times daily. One po bid x 7 days 10/27/14   Carman Ching, PA-C  fluconazole (DIFLUCAN) 150 MG tablet 1 tablet weekly X 4 Patient not taking: Reported on 10/27/2014 09/18/14   Antonietta Barcelona, FNP  nystatin ointment (MYCOSTATIN) Apply 1 application topically 2 (two) times daily. Patient not taking: Reported on 10/27/2014 09/18/14   Antonietta Barcelona, FNP  ondansetron (ZOFRAN ODT) 4 MG disintegrating tablet 4mg  ODT q4 hours prn nausea/vomit 10/27/14   Cairo Lingenfelter M Elasia Furnish, PA-C  oxyCODONE-acetaminophen (PERCOCET) 5-325 MG per tablet Take 1-2 tablets by mouth every 6 (six) hours as needed for severe pain. 10/27/14   Jeanae Whitmill M Verdelle Valtierra, PA-C   BP 139/78 mmHg  Pulse 64  Temp(Src) 97.6 F (36.4 C) (Oral)  Resp 18  Ht 5\' 3"  (1.6 m)  Wt 210 lb (95.255 kg)  BMI 37.21 kg/m2  SpO2 95%  LMP 01/19/1992 Physical Exam  Constitutional: She is oriented to person, place, and time. She appears well-developed and well-nourished.  Uncomfortable but in NAD.  HENT:  Head: Normocephalic and atraumatic.  Mouth/Throat: Oropharynx is clear and moist.  Eyes: Conjunctivae and EOM are normal.  Neck: Normal range of motion. Neck supple.  Cardiovascular: Normal rate, regular rhythm and normal heart sounds.   Pulmonary/Chest: Effort normal and breath sounds normal. No respiratory distress.  Abdominal: Soft. Bowel sounds are normal. She exhibits no distension. There is no tenderness.  +R CVAT.  Musculoskeletal: Normal range of motion. She exhibits no edema.  Neurological: She is alert and oriented to person, place, and time. No sensory deficit.  Skin: Skin is warm and dry.  Psychiatric: She has a normal mood and affect. Her behavior is normal.  Nursing note and vitals  reviewed.   ED Course  Procedures (including critical care time) Labs Review Labs Reviewed  BASIC METABOLIC PANEL - Abnormal; Notable for the following:    BUN 26 (*)    Creatinine, Ser 1.82 (*)    GFR calc non Af Amer 27 (*)    GFR calc Af Amer 32 (*)    All other components within normal limits  URINALYSIS, ROUTINE W REFLEX MICROSCOPIC - Abnormal; Notable for the following:    Color, Urine RED (*)    APPearance TURBID (*)    Hgb urine dipstick LARGE (*)    Ketones, ur 15 (*)    Protein, ur 100 (*)    Leukocytes, UA LARGE (*)    All other components  within normal limits  URINE MICROSCOPIC-ADD ON - Abnormal; Notable for the following:    Squamous Epithelial / LPF FEW (*)    All other components within normal limits  URINE CULTURE  CBC    Imaging Review Ct Renal Stone Study  10/27/2014   CLINICAL DATA:  Right flank pain, blood in urine  EXAM: CT ABDOMEN AND PELVIS WITHOUT CONTRAST  TECHNIQUE: Multidetector CT imaging of the abdomen and pelvis was performed following the standard protocol without IV contrast.  COMPARISON:  11/29/2013  FINDINGS: The lung bases are unremarkable. Stable moderate size hiatal hernia. The patient is status postcholecystectomy. Unenhanced liver shows no biliary ductal dilatation. Unenhanced pancreas, spleen and adrenal glands are unremarkable. There is mild fullness of the left renal collecting system and left ureter without frank hydronephrosis. At least 2 nonobstructive punctate calcified calculi are noted lower pole of the left kidney the largest measures 2 mm. No left ureteral calculi are noted. Minimal left perinephric stranding. There is cortical thinning left kidney. There is mild right hydronephrosis and proximal right hydroureter. Nonobstructive calcified calculus in lower pole of the right kidney measures 1.5 mm. There is nonobstructive calculus in right renal pelvis measures 8.3 mm. Axial image 44 there is calcified obstructive calculus in proximal  right ureter at the level of upper endplate of L4 vertebral body measures 4.2 mm. Bilateral distal ureter is unremarkable.  No small bowel obstruction. No ascites or free air. No adenopathy. Normal retrocecal appendix. No pericecal inflammation. Scattered diverticula are noted descending colon. Multiple sigmoid colon diverticula. There is no evidence of acute diverticulitis. There is under distended urinary bladder. Thickening of urinary bladder wall. Cystitis cannot be excluded. Again noted significant degenerative changes lumbar spine at L1-L2 and L2-L3 level. Slight retrolisthesis L2 on L3 vertebral body again noted.  There is a midline right paraumbilical hernia containing fat measures 2.7 cm without evidence of acute complication. There is a midline anterior pelvic wall ventral hernia measures 7.6 x 4.3 cm containing fat without evidence of acute complication.  IMPRESSION: 1. There is mild right hydronephrosis and proximal right hydroureter. Nonobstructive calculus in right renal pelvis measures 8.3 mm. There is obstructive calculus in proximal right ureter measures 4.2 mm at the level of upper endplate of L4 vertebral body. 2. Left nonobstructive nephrolithiasis. There is mild fullness of the left renal collecting system and left ureter without frank hydronephrosis. No left ureteral calculi are noted. Mild left perinephric stranding. There is again noted cortical thinning left kidney. 3. There is right paraumbilical ventral hernia containing fat without evidence of acute complication. Midline anterior pelvic wall ventral hernia containing fat without evidence of acute complication. 4. Stable moderate size hiatal hernia. 5. There is thickening of urinary bladder wall. Cystitis cannot be excluded. Urinary bladder is under distended. 6. Scattered diverticula are noted descending colon. Multiple sigmoid colon diverticula. There is no evidence of acute diverticulitis. 7. Normal appendix.  No pericecal inflammation.    Electronically Signed   By: Lahoma Crocker M.D.   On: 10/27/2014 08:14     EKG Interpretation None      MDM   Final diagnoses:  Flank pain  Right ureteral stone  Right kidney stone  Urinary tract infection with hematuria, site unspecified   Nontoxic appearing, NAD. Afebrile. VSS. Abdomen soft and nontender. Right-sided CVA tenderness. Recent treatment for pneumonia and UTI. Unknown antibiotic. Hematuria and flank pain may be coming from either kidney stone or pyelo. Plan to obtain CT without contrast, labs and UA. Pain  meds ordered. Urologist Dr. Gaynelle Arabian.  CT results as stated above. Labs without acute finding. Kidney functions at baseline. Urinalysis significant for infection. Given results of CT, history of urosepsis and septic shock from UTI, I spoke with Dr. Jeffie Pollock with urology who advised patient to be seen in the office later today. The office will contact her with an appointment to be seen today. He advised to start her on low-dose ciprofloxacin. Will discharge with Percocet and Zofran. Pain controlled in the ED. Stable for discharge. Return precautions given. Patient states understanding of treatment care plan and is agreeable.  Discussed with attending Dr. Mingo Amber who also evaluated patient and agrees with plan of care.    Carman Ching, PA-C 10/27/14 0900  Evelina Bucy, MD 10/27/14 [phone]

## 2014-10-27 NOTE — ED Notes (Signed)
Patient coming from home with symptoms of kidney stone on the right side.  Patient states she was having blood in her urine "all day yesterday", Sunday and the pain started around 05:45 today (Monday).  Pain is 8/10.

## 2014-10-28 ENCOUNTER — Ambulatory Visit (HOSPITAL_BASED_OUTPATIENT_CLINIC_OR_DEPARTMENT_OTHER)
Admission: RE | Admit: 2014-10-28 | Discharge: 2014-10-28 | Disposition: A | Discharge status: Home / Self Care | Payer: Medicare Other | Source: Ambulatory Visit | Attending: Urology | Admitting: Urology

## 2014-10-28 ENCOUNTER — Ambulatory Visit (HOSPITAL_BASED_OUTPATIENT_CLINIC_OR_DEPARTMENT_OTHER): Payer: Medicare Other | Admitting: Certified Registered"

## 2014-10-28 ENCOUNTER — Encounter (HOSPITAL_BASED_OUTPATIENT_CLINIC_OR_DEPARTMENT_OTHER): Payer: Self-pay

## 2014-10-28 ENCOUNTER — Other Ambulatory Visit: Payer: Self-pay

## 2014-10-28 ENCOUNTER — Encounter (HOSPITAL_BASED_OUTPATIENT_CLINIC_OR_DEPARTMENT_OTHER)
Admission: RE | Disposition: A | Discharge status: Home / Self Care | Payer: Self-pay | Source: Ambulatory Visit | Attending: Urology

## 2014-10-28 DIAGNOSIS — Z87891 Personal history of nicotine dependence: Secondary | ICD-10-CM | POA: Insufficient documentation

## 2014-10-28 DIAGNOSIS — F419 Anxiety disorder, unspecified: Secondary | ICD-10-CM | POA: Diagnosis not present

## 2014-10-28 DIAGNOSIS — K219 Gastro-esophageal reflux disease without esophagitis: Secondary | ICD-10-CM | POA: Diagnosis not present

## 2014-10-28 DIAGNOSIS — Z888 Allergy status to other drugs, medicaments and biological substances status: Secondary | ICD-10-CM | POA: Diagnosis not present

## 2014-10-28 DIAGNOSIS — Z90710 Acquired absence of both cervix and uterus: Secondary | ICD-10-CM | POA: Insufficient documentation

## 2014-10-28 DIAGNOSIS — Z9049 Acquired absence of other specified parts of digestive tract: Secondary | ICD-10-CM | POA: Diagnosis not present

## 2014-10-28 DIAGNOSIS — I252 Old myocardial infarction: Secondary | ICD-10-CM | POA: Insufficient documentation

## 2014-10-28 DIAGNOSIS — Z886 Allergy status to analgesic agent status: Secondary | ICD-10-CM | POA: Insufficient documentation

## 2014-10-28 DIAGNOSIS — I10 Essential (primary) hypertension: Secondary | ICD-10-CM | POA: Diagnosis not present

## 2014-10-28 DIAGNOSIS — N201 Calculus of ureter: Secondary | ICD-10-CM | POA: Diagnosis present

## 2014-10-28 DIAGNOSIS — I4891 Unspecified atrial fibrillation: Secondary | ICD-10-CM | POA: Diagnosis not present

## 2014-10-28 DIAGNOSIS — Z7982 Long term (current) use of aspirin: Secondary | ICD-10-CM | POA: Insufficient documentation

## 2014-10-28 DIAGNOSIS — G43909 Migraine, unspecified, not intractable, without status migrainosus: Secondary | ICD-10-CM | POA: Insufficient documentation

## 2014-10-28 HISTORY — PX: CYSTOSCOPY WITH STENT PLACEMENT: SHX5790

## 2014-10-28 HISTORY — DX: Calculus of kidney: N20.0

## 2014-10-28 HISTORY — DX: Diverticulosis of large intestine without perforation or abscess without bleeding: K57.30

## 2014-10-28 HISTORY — DX: Hyperlipidemia, unspecified: E78.5

## 2014-10-28 LAB — URINE CULTURE: Colony Count: 50000

## 2014-10-28 LAB — POCT I-STAT 4, (NA,K, GLUC, HGB,HCT)
Glucose, Bld: 99 mg/dL (ref 70–99)
HCT: 37 % (ref 36.0–46.0)
Hemoglobin: 12.6 g/dL (ref 12.0–15.0)
Potassium: 4.1 mmol/L (ref 3.5–5.1)
Sodium: 139 mmol/L (ref 135–145)

## 2014-10-28 SURGERY — CYSTOSCOPY, WITH STENT INSERTION
Anesthesia: General | Site: Ureter | Laterality: Right

## 2014-10-28 MED ORDER — OXYCODONE HCL 5 MG PO TABS
5.0000 mg | ORAL_TABLET | ORAL | Status: DC | PRN
  Filled 2014-10-28: qty 2

## 2014-10-28 MED ORDER — PROMETHAZINE HCL 25 MG/ML IJ SOLN
6.2500 mg | INTRAMUSCULAR | Status: DC | PRN
  Filled 2014-10-28: qty 1

## 2014-10-28 MED ORDER — CIPROFLOXACIN IN D5W 400 MG/200ML IV SOLN
400.0000 mg | INTRAVENOUS | Status: AC
  Administered 2014-10-28: 400 mg via INTRAVENOUS
  Filled 2014-10-28: qty 200

## 2014-10-28 MED ORDER — MIDAZOLAM HCL 2 MG/2ML IJ SOLN
INTRAMUSCULAR | Status: AC
  Filled 2014-10-28: qty 2

## 2014-10-28 MED ORDER — SODIUM CHLORIDE 0.9 % IJ SOLN
3.0000 mL | Freq: Two times a day (BID) | INTRAMUSCULAR | Status: DC
Start: 2014-10-28 — End: 2014-10-28
  Filled 2014-10-28: qty 3

## 2014-10-28 MED ORDER — FENTANYL CITRATE (PF) 100 MCG/2ML IJ SOLN
25.0000 ug | INTRAMUSCULAR | Status: DC | PRN
  Filled 2014-10-28: qty 1

## 2014-10-28 MED ORDER — FENTANYL CITRATE (PF) 100 MCG/2ML IJ SOLN
INTRAMUSCULAR | Status: DC | PRN
  Administered 2014-10-28: 50 ug via INTRAVENOUS

## 2014-10-28 MED ORDER — SODIUM CHLORIDE 0.9 % IV SOLN
250.0000 mL | INTRAVENOUS | Status: DC | PRN
  Filled 2014-10-28: qty 250

## 2014-10-28 MED ORDER — MEPERIDINE HCL 25 MG/ML IJ SOLN
6.2500 mg | INTRAMUSCULAR | Status: DC | PRN
  Filled 2014-10-28: qty 1

## 2014-10-28 MED ORDER — LIDOCAINE HCL (CARDIAC) 20 MG/ML IV SOLN
INTRAVENOUS | Status: DC | PRN
  Administered 2014-10-28: 60 mg via INTRAVENOUS

## 2014-10-28 MED ORDER — ACETAMINOPHEN 325 MG PO TABS
650.0000 mg | ORAL_TABLET | ORAL | Status: DC | PRN
  Filled 2014-10-28: qty 2

## 2014-10-28 MED ORDER — SODIUM CHLORIDE 0.9 % IJ SOLN
3.0000 mL | INTRAMUSCULAR | Status: DC | PRN
  Filled 2014-10-28: qty 3

## 2014-10-28 MED ORDER — MIDAZOLAM HCL 5 MG/5ML IJ SOLN
INTRAMUSCULAR | Status: DC | PRN
  Administered 2014-10-28: 1 mg via INTRAVENOUS

## 2014-10-28 MED ORDER — FENTANYL CITRATE (PF) 100 MCG/2ML IJ SOLN
INTRAMUSCULAR | Status: AC
  Filled 2014-10-28: qty 6

## 2014-10-28 MED ORDER — PROPOFOL 10 MG/ML IV BOLUS
INTRAVENOUS | Status: DC | PRN
  Administered 2014-10-28: 150 mg via INTRAVENOUS

## 2014-10-28 MED ORDER — SODIUM CHLORIDE 0.9 % IR SOLN
Status: DC | PRN
  Administered 2014-10-28: 3000 mL

## 2014-10-28 MED ORDER — LACTATED RINGERS IV SOLN
INTRAVENOUS | Status: DC
  Administered 2014-10-28: 08:00:00 via INTRAVENOUS
  Filled 2014-10-28: qty 1000

## 2014-10-28 MED ORDER — ACETAMINOPHEN 650 MG RE SUPP
650.0000 mg | RECTAL | Status: DC | PRN
  Filled 2014-10-28: qty 1

## 2014-10-28 MED ORDER — ONDANSETRON HCL 4 MG/2ML IJ SOLN
INTRAMUSCULAR | Status: DC | PRN
  Administered 2014-10-28: 4 mg via INTRAVENOUS

## 2014-10-28 MED ORDER — EPHEDRINE SULFATE 50 MG/ML IJ SOLN
INTRAMUSCULAR | Status: DC | PRN
  Administered 2014-10-28: 10 mg via INTRAVENOUS

## 2014-10-28 MED ORDER — DEXAMETHASONE SODIUM PHOSPHATE 4 MG/ML IJ SOLN
INTRAMUSCULAR | Status: DC | PRN
  Administered 2014-10-28: 8 mg via INTRAVENOUS

## 2014-10-28 SURGICAL SUPPLY — 19 items
BAG DRAIN URO-CYSTO SKYTR STRL (DRAIN) ×2 IMPLANT
BAG DRN UROCATH (DRAIN) ×1
CANISTER SUCT LVC 12 LTR MEDI- (MISCELLANEOUS) ×1 IMPLANT
CATH URET 5FR 28IN CONE TIP (BALLOONS)
CATH URET 5FR 28IN OPEN ENDED (CATHETERS) ×2 IMPLANT
CATH URET 5FR 70CM CONE TIP (BALLOONS) IMPLANT
CLOTH BEACON ORANGE TIMEOUT ST (SAFETY) ×2 IMPLANT
GLOVE INDICATOR 7.5 STRL GRN (GLOVE) ×1 IMPLANT
GLOVE SS N UNI LF 7.5 STRL (GLOVE) ×2 IMPLANT
GLOVE SURG SS PI 8.0 STRL IVOR (GLOVE) ×2 IMPLANT
GOWN STRL REUS W/TWL LRG LVL3 (GOWN DISPOSABLE) ×1 IMPLANT
GOWN STRL REUS W/TWL XL LVL3 (GOWN DISPOSABLE) ×1 IMPLANT
GUIDEWIRE 0.038 PTFE COATED (WIRE) IMPLANT
GUIDEWIRE ANG ZIPWIRE 038X150 (WIRE) IMPLANT
GUIDEWIRE STR DUAL SENSOR (WIRE) ×2 IMPLANT
IV NS IRRIG 3000ML ARTHROMATIC (IV SOLUTION) ×1 IMPLANT
NS IRRIG 500ML POUR BTL (IV SOLUTION) IMPLANT
PACK CYSTO (CUSTOM PROCEDURE TRAY) ×2 IMPLANT
STENT URET 6FRX24 CONTOUR (STENTS) ×1 IMPLANT

## 2014-10-28 NOTE — Discharge Instructions (Addendum)
CYSTOSCOPY HOME CARE INSTRUCTIONS ° °Activity: °Rest for the remainder of the day.  Do not drive or operate equipment today.  You may resume normal activities in one to two days as instructed by your physician.  ° °Meals: °Drink plenty of liquids and eat light foods such as gelatin or soup this evening.  You may return to a normal meal plan tomorrow. ° °Return to Work: °You may return to work in one to two days or as instructed by your physician. ° °Special Instructions / Symptoms: °Call your physician if any of these symptoms occur: ° ° -persistent or heavy bleeding ° -bleeding which continues after first few urination ° -large blood clots that are difficult to pass ° -urine stream diminishes or stops completely ° -fever equal to or higher than 101 degrees Farenheit. ° -cloudy urine with a strong, foul odor ° -severe pain ° °Females should always wipe from front to back after elimination.  You may feel some burning pain when you urinate.  This should disappear with time.  Applying moist heat to the lower abdomen or a hot tub bath may help relieve the pain. \ ° ° °Patient Signature:  ________________________________________________________ °Nurse's Signature:  _______________________________________________________ ° ° °Post Anesthesia Home Care Instructions ° °Activity: °Get plenty of rest for the remainder of the day. A responsible adult should stay with you for 24 hours following the procedure.  °For the next 24 hours, DO NOT: °-Drive a car °-Operate machinery °-Drink alcoholic beverages °-Take any medication unless instructed by your physician °-Make any legal decisions or sign important papers. ° °Meals: °Start with liquid foods such as gelatin or soup. Progress to regular foods as tolerated. Avoid greasy, spicy, heavy foods. If nausea and/or vomiting occur, drink only clear liquids until the nausea and/or vomiting subsides. Call your physician if vomiting continues. ° °Special Instructions/Symptoms: °Your  throat may feel dry or sore from the anesthesia or the breathing tube placed in your throat during surgery. If this causes discomfort, gargle with warm salt water. The discomfort should disappear within 24 hours. ° °If you had a scopolamine patch placed behind your ear for the management of post- operative nausea and/or vomiting: ° °1. The medication in the patch is effective for 72 hours, after which it should be removed.  Wrap patch in a tissue and discard in the trash. Wash hands thoroughly with soap and water. °2. You may remove the patch earlier than 72 hours if you experience unpleasant side effects which may include dry mouth, dizziness or visual disturbances. °3. Avoid touching the patch. Wash your hands with soap and water after contact with the patch. °  ° °

## 2014-10-28 NOTE — Anesthesia Postprocedure Evaluation (Signed)
  Anesthesia Post-op Note  Patient: [name]  Procedure(s) Performed: Procedure(s) (LRB): RIGHT URETERAL STENT PLACEMENT (Right)  Patient Location: PACU  Anesthesia Type: General  Level of Consciousness: awake and alert   Airway and Oxygen Therapy: Patient Spontanous Breathing  Post-op Pain: mild  Post-op Assessment: Post-op Vital signs reviewed, Patient's Cardiovascular Status Stable, Respiratory Function Stable, Patent Airway and No signs of Nausea or vomiting  Last Vitals:  Filed Vitals:   10/28/14 1027  BP: 119/64  Pulse: 61  Temp: 36.5 C  Resp: 16    Post-op Vital Signs: stable   Complications: No apparent anesthesia complications

## 2014-10-28 NOTE — H&P (Signed)
Reason For Visit kidney stones   History of Present Illness 21F patient of Dr. Dorris Fetch who has a history of bilateral non-obstucting kidney stones, recurrent UTIs and bladder pain. She was last seen in July of 2015 and a CT scan performed at that time demonstrated a 48mm stone in the right lower pole and 2 adjacent stones measuring 49mm stone in the left lower pole.     PAtient seen today as an urgent work-in. Patient describes acute onset right flank pain starting early this morning. The patient clinically was taken to the emergency room by her daughter where a CT scan demonstrated a 4.5 mm right proximal ureteral stone with associated hydronephrosis. In addition, there was a larger stone in the right UPJ. In the ER the patient had a urinalysis that appeared to be consistent with infection. The patient was alert on ciprofloxacin, given pain medication and Zofran, and advised to follow up with urology.    The patient relates moderate discomfort. She denies any fevers. She has had some gross hematuria. She has not had any dysuria. She denies any significant nausea or vomiting.    The patient has a past medical history significant for urosepsis, 3 years prior. This involved 10 days of intubation and a long recovery from infected obstructing stone. Further, based on the patient's imaging it appears as if she has left kidney.   Past Medical History Problems  1. History of Acute renal insufficiency (N28.9) 2. History of Anxiety (F41.9) 3. History of Atrial fibrillation (I48.91) 4. History of Atrial fibrillation (I48.91) 5. History of Calculus of ureter (N20.1) 6. History of esophageal reflux (Z87.19) 7. History of esophageal reflux (Z87.19) 8. History of glaucoma (Z86.69) 9. History of hypertension (Z86.79) 10. History of hypertension (Z86.79) 11. History of kidney stones (Z87.442) 12. History of migraine headaches (Z86.69) 13. History of Pneumonia 14. History of Sepsis (A41.9) 15.  History of ST Elevation Myocardial Infarction  Surgical History Problems  1. History of Cholecystectomy 2. History of Cholecystectomy 3. History of Cystoscopy With Insertion Of Ureteral Stent Left 4. History of Hysterectomy 5. History of Hysterectomy 6. History of Knee Surgery 7. History of Lithotripsy 8. History of Lithotripsy 9. History of Lithotripsy  Current Meds 1. AmLODIPine Besylate 5 MG Oral Tablet;  Therapy: (Recorded:02Apr2014) to Recorded 2. Aspirin 81 MG TABS;  Therapy: (Recorded:21Nov2013) to Recorded 3. Calcium 500 TABS;  Therapy: (Recorded:02Apr2014) to Recorded 4. Lumigan 0.01 % Ophthalmic Solution;  Therapy: (Recorded:21Nov2013) to Recorded 5. Metoprolol Tartrate 25 MG Oral Tablet;  Therapy: (Recorded:02Apr2014) to Recorded 6. Multiple Vitamin TABS;  Therapy: (Recorded:21Nov2013) to Recorded 7. Nitrofurantoin Macrocrystal 100 MG Oral Capsule; TAKE 1 CAPSULE EVERY DAY;  Therapy: [id number] to (Last Rx:12Nov2014)  Requested for: [id number] Ordered 8. Timolol Maleate 0.5 % Ophthalmic Solution;  Therapy: (Recorded:20Oct2015) to Recorded 9. Zantac 150 MG CAPS;  Therapy: (Recorded:02Apr2014) to Recorded  Allergies Medication  1. Percocet TABS 2. Vicodin TABS  Family History Problems  1. Family history of Death In The Family Father   Died age 74- pancreatic cancer 2. Family history of Death In The Family Mother   Died age 26-Dementia 28. Family history of Nephrolithiasis : Father 4. Family history of Nephrolithiasis : Brother  Social History Problems  1. Denied: History of Alcohol Use 2. Denied: History of Caffeine Use 3. Marital History - Widowed 4. Never A Smoker 5. Occupation:   Herbalist 6. Denied: History of Tobacco Use  Vitals Vital Signs [Data Includes: Last 1 Day]  Recorded: [id number] 02:20PM  Blood  Pressure: 133 / 81 Temperature: 98 F Heart Rate: 64  Physical Exam Constitutional:. Mild distress.  Pulmonary: No respiratory  distress and normal respiratory rhythm and effort.  Cardiovascular: Heart rate and rhythm are normal . No peripheral edema.  Abdomen: The abdomen is soft and nontender. Right CVA tenderness and no left CVA tenderness.  Neuro/Psych:. Mood and affect are appropriate.    Results/Data Urine [Data Includes: Last 1 Day]   [id number]  COLOR YELLOW   APPEARANCE CLOUDY   SPECIFIC GRAVITY 1.020   pH 6.0   GLUCOSE NEG mg/dL  BILIRUBIN NEG   KETONE NEG mg/dL  BLOOD LARGE   PROTEIN NEG mg/dL  UROBILINOGEN 0.2 mg/dL  NITRITE NEG   LEUKOCYTE ESTERASE MOD   SQUAMOUS EPITHELIAL/HPF FEW   WBC TNTC WBC/hpf  RBC TNTC RBC/hpf  BACTERIA FEW   CRYSTALS NONE SEEN   CASTS NONE SEEN    Patient had a KUB today in clinic to evaluate for the patient's stone location. The renal shadows are apparent bilaterally. The patient appears to have 2 stones within the renal pelvis of the right kidney. I was unable to appreciate a ureteral stone at the same location as was noted on her CT scan, around the level of L3. The left kidney appears to have 2 small stones in it as well, these are mostly upper pole. There are no identifiable stones along the expected trajectory of either ureter. The patient does have some pelvic phleboliths. The gas pattern is grossly normal. There are no gross bony abnormalities.   Assessment 69 year old female with a 4.5 mm right obstructing proximal ureteral stone. The patient also appears to have a urinary tract infection based on her urinalysis obtained in the emergency department. She has a significant past medical history of urosepsis from an obstructing stone 3 years prior.   Plan Bilateral kidney stones  1. KUB; Status:Resulted - Requires Verification;   [id number] 02:52PM Health Maintenance  2. UA With REFLEX; [Do Not Release]; Status:Complete;   Done: [id number] 02:07PM Pyuria  3. URINE CULTURE; Status:In Progress - Specimen/Data Collected;   Done:  [id number]  Discussion/Summary We discussed the treatment options including medical expulsion therapy, urgent stent placement, or scheduling a stent placement tomorrow morning. I think given her history of urosepsis from obstructing stone, her urinalysis today, and her atrophic left kidney she needs a stent placed sooner rather than later. The patient just ate and as such would not be able to have general anesthesia until late tonight. As such, I recommended that the patient consider a stent tomorrow morning by Dr. Jeffie Pollock. Once her infection has been adequately treated hopefully, she can have both stones treated at once. I went over the procedure of stent placement with the patient in detail. She understands the side effects of stents including worsening urinary frequency and urgency as well as bladder pressure/pain. She also understands she is likely to have some gross hematuria with her stent in place. She understands that this is a temporizing measure and then ultimately, she will need a second procedure to have her stone removed. Between now and tomorrow, the patient should continue to take her antibiotics as prescribed. She has adequate pain medication.

## 2014-10-28 NOTE — Brief Op Note (Signed)
10/28/2014  8:50 AM  PATIENT:  [name]  69 y.o. female  PRE-OPERATIVE DIAGNOSIS:  RIGHT URETERAL STONE   POST-OPERATIVE DIAGNOSIS:  RIGHT URETERAL STONE   PROCEDURE:  Procedure(s): RIGHT URETERAL STENT PLACEMENT (Right)  SURGEON:  Surgeon(s) and Role:    * Irine Seal, MD - Primary  PHYSICIAN ASSISTANT:   ASSISTANTS: none   ANESTHESIA:   general  EBL:  Total I/O In: 200 [I.V.:200] Out: -   BLOOD ADMINISTERED:none  DRAINS: 6 x 24 contour right JJ stent   LOCAL MEDICATIONS USED:  NONE  SPECIMEN:  No Specimen  DISPOSITION OF SPECIMEN:  N/A  COUNTS:  YES  TOURNIQUET:  * No tourniquets in log *  DICTATION: .Other Dictation: Dictation Number [id number]  PLAN OF CARE: Discharge to home after PACU  PATIENT DISPOSITION:  PACU - hemodynamically stable.   Delay start of Pharmacological VTE agent (>24hrs) due to surgical blood loss or risk of bleeding: not applicable

## 2014-10-28 NOTE — Transfer of Care (Signed)
Immediate Anesthesia Transfer of Care Note  Patient: [name]  Procedure(s) Performed: Procedure(s) (LRB): RIGHT URETERAL STENT PLACEMENT (Right)  Patient Location: PACU  Anesthesia Type: General  Level of Consciousness: awake, oriented, sedated and patient cooperative  Airway & Oxygen Therapy: Patient Spontanous Breathing and Patient connected to face mask oxygen  Post-op Assessment: Report given to PACU RN and Post -op Vital signs reviewed and stable  Post vital signs: Reviewed and stable  Complications: No apparent anesthesia complications

## 2014-10-28 NOTE — Anesthesia Procedure Notes (Signed)
Procedure Name: LMA Insertion Date/Time: 10/28/2014 8:35 AM Performed by: Denna Haggard D Pre-anesthesia Checklist: Patient identified, Emergency Drugs available, Suction available and Patient being monitored Patient Re-evaluated:Patient Re-evaluated prior to inductionOxygen Delivery Method: Circle System Utilized Preoxygenation: Pre-oxygenation with 100% oxygen Intubation Type: IV induction Ventilation: Mask ventilation without difficulty LMA: LMA inserted LMA Size: 4.0 Number of attempts: 1 Airway Equipment and Method: Bite block Placement Confirmation: positive ETCO2 Tube secured with: Tape Dental Injury: Teeth and Oropharynx as per pre-operative assessment

## 2014-10-28 NOTE — Anesthesia Preprocedure Evaluation (Signed)
Anesthesia Evaluation  Patient identified by MRN, date of birth, ID band Patient awake    Reviewed: Allergy & Precautions, NPO status , Patient's Chart, lab work & pertinent test results  Airway Mallampati: II  TM Distance: >3 FB Neck ROM: Full    Dental no notable dental hx.    Pulmonary pneumonia -, resolved,  breath sounds clear to auscultation  Pulmonary exam normal       Cardiovascular hypertension, + Past MI and +CHF Normal cardiovascular exam+ dysrhythmias Atrial Fibrillation Rhythm:Regular Rate:Normal  Episode of septic shock 2013, MI afib.   Neuro/Psych negative neurological ROS  negative psych ROS   GI/Hepatic Neg liver ROS, GERD-  Medicated and Controlled,  Endo/Other  negative endocrine ROS  Renal/GU Renal disease  negative genitourinary   Musculoskeletal negative musculoskeletal ROS (+)   Abdominal   Peds negative pediatric ROS (+)  Hematology negative hematology ROS (+)   Anesthesia Other Findings   Reproductive/Obstetrics negative OB ROS                             Anesthesia Physical Anesthesia Plan  ASA: III  Anesthesia Plan: General   Post-op Pain Management:    Induction: Intravenous  Airway Management Planned: LMA  Additional Equipment:   Intra-op Plan:   Post-operative Plan: Extubation in OR  Informed Consent: I have reviewed the patients History and Physical, chart, labs and discussed the procedure including the risks, benefits and alternatives for the proposed anesthesia with the patient or authorized representative who has indicated his/her understanding and acceptance.   Dental advisory given  Plan Discussed with: CRNA  Anesthesia Plan Comments:         Anesthesia Quick Evaluation

## 2014-10-29 ENCOUNTER — Encounter (HOSPITAL_BASED_OUTPATIENT_CLINIC_OR_DEPARTMENT_OTHER): Payer: Self-pay | Admitting: Urology

## 2014-10-29 ENCOUNTER — Other Ambulatory Visit: Payer: Self-pay | Admitting: Geriatric Medicine

## 2014-10-29 ENCOUNTER — Ambulatory Visit
Admission: RE | Admit: 2014-10-29 | Discharge: 2014-10-29 | Disposition: A | Discharge status: Home / Self Care | Payer: Medicare Other | Source: Ambulatory Visit | Attending: Geriatric Medicine | Admitting: Geriatric Medicine

## 2014-10-29 DIAGNOSIS — J690 Pneumonitis due to inhalation of food and vomit: Secondary | ICD-10-CM

## 2014-10-29 NOTE — Op Note (Signed)
NAMEFARRIN, AZZARELLO               ACCOUNT NO.:  [id number]  MEDICAL RECORD NO.:  [id number]  LOCATION:                                 FACILITY:  PHYSICIAN:  Marshall Cork. Jeffie Pollock, M.D.    DATE OF BIRTH:  [birth year]  DATE OF PROCEDURE: DATE OF DISCHARGE:                              OPERATIVE REPORT   PROCEDURE:  Cystoscopy and placement of right double-J stent.  PREOPERATIVE DIAGNOSIS:  Right proximal ureteral stone.  POSTOPERATIVE DIAGNOSIS:  Right proximal ureteral stone.  SURGEON:  Marshall Cork. Jeffie Pollock, MD  ANESTHESIA:  General.  SPECIMEN:  None.  DRAINS:  A 6-French x 24 cm double-J stent.  BLOOD LOSS:  None.  COMPLICATIONS:  None.  INDICATIONS:  Ms. Makinson is a 69 year old white female with a history of stones and infections who was seen yesterday with a 3 mm proximal obstructing stone.  Her urine looked infected, but she was afebrile.  It was felt that stenting was indicated with subsequent ureteroscopy to be performed at a later date.  FINDINGS OF PROCEDURE:  She was taken to the operating room where she was given Cipro.  She was placed in lithotomy position.  Her perineum and genitalia were prepped with Betadine solution.  She was draped in usual sterile fashion.  She did have marked anterior prolapse with angulation of the urethra, but I was able to insert the 23-French cystoscope without difficulty.  Inspection of the bladder revealed initially cloudy urine which was drained.  The bladder was then irrigated.  She had patchy erythema consistent with chronic inflammatory changes.  No tumors or stones were seen.  Ureteral orifices were unremarkable.  The right ureteral orifice was cannulated with a Sensor guidewire which was passed to the kidney without difficulty.  A 6-French 24 cm Contour double-J stent was then inserted over the wire to the kidney under fluoroscopic guidance.  The wire was removed leaving good coil in the kidney, a good coil in the bladder.  Inspection  of the distal stent coil demonstrated a clear urine effluxing from the ports.  Once the stent was well positioned, the bladder was drained.  The cystoscope was removed.  The patient was taken down from lithotomy position.  Her anesthetic was reversed.  She was moved to recovery room in stable condition.  There were no complications.     Marshall Cork. Jeffie Pollock, M.D.     JJW/MEDQ  D:  10/28/2014  T:  10/29/2014  Job:  [id number]  cc:   Pierre Bali I. Gaynelle Arabian, M.D. Fax: [phone]

## 2014-11-06 ENCOUNTER — Other Ambulatory Visit: Payer: Self-pay | Admitting: Urology

## 2014-11-12 ENCOUNTER — Encounter (HOSPITAL_COMMUNITY): Payer: Self-pay | Admitting: *Deleted

## 2014-11-12 ENCOUNTER — Inpatient Hospital Stay (HOSPITAL_COMMUNITY)
Admission: EM | Admit: 2014-11-12 | Discharge: 2014-11-17 | DRG: 872 | Disposition: A | Discharge status: Home / Self Care | Payer: Medicare Other | Attending: Internal Medicine | Admitting: Internal Medicine

## 2014-11-12 DIAGNOSIS — I480 Paroxysmal atrial fibrillation: Secondary | ICD-10-CM | POA: Diagnosis present

## 2014-11-12 DIAGNOSIS — I4891 Unspecified atrial fibrillation: Secondary | ICD-10-CM | POA: Diagnosis present

## 2014-11-12 DIAGNOSIS — I129 Hypertensive chronic kidney disease with stage 1 through stage 4 chronic kidney disease, or unspecified chronic kidney disease: Secondary | ICD-10-CM | POA: Diagnosis present

## 2014-11-12 DIAGNOSIS — Z8701 Personal history of pneumonia (recurrent): Secondary | ICD-10-CM

## 2014-11-12 DIAGNOSIS — Z90710 Acquired absence of both cervix and uterus: Secondary | ICD-10-CM

## 2014-11-12 DIAGNOSIS — E860 Dehydration: Secondary | ICD-10-CM | POA: Diagnosis present

## 2014-11-12 DIAGNOSIS — Z87440 Personal history of urinary (tract) infections: Secondary | ICD-10-CM

## 2014-11-12 DIAGNOSIS — Z961 Presence of intraocular lens: Secondary | ICD-10-CM | POA: Diagnosis present

## 2014-11-12 DIAGNOSIS — I509 Heart failure, unspecified: Secondary | ICD-10-CM | POA: Diagnosis present

## 2014-11-12 DIAGNOSIS — Z8709 Personal history of other diseases of the respiratory system: Secondary | ICD-10-CM

## 2014-11-12 DIAGNOSIS — N179 Acute kidney failure, unspecified: Secondary | ICD-10-CM | POA: Diagnosis present

## 2014-11-12 DIAGNOSIS — E871 Hypo-osmolality and hyponatremia: Secondary | ICD-10-CM | POA: Diagnosis present

## 2014-11-12 DIAGNOSIS — R509 Fever, unspecified: Secondary | ICD-10-CM | POA: Diagnosis not present

## 2014-11-12 DIAGNOSIS — N183 Chronic kidney disease, stage 3 (moderate): Secondary | ICD-10-CM | POA: Diagnosis present

## 2014-11-12 DIAGNOSIS — N12 Tubulo-interstitial nephritis, not specified as acute or chronic: Secondary | ICD-10-CM

## 2014-11-12 DIAGNOSIS — Z9049 Acquired absence of other specified parts of digestive tract: Secondary | ICD-10-CM | POA: Diagnosis present

## 2014-11-12 DIAGNOSIS — K219 Gastro-esophageal reflux disease without esophagitis: Secondary | ICD-10-CM | POA: Diagnosis present

## 2014-11-12 DIAGNOSIS — Z888 Allergy status to other drugs, medicaments and biological substances status: Secondary | ICD-10-CM

## 2014-11-12 DIAGNOSIS — M1990 Unspecified osteoarthritis, unspecified site: Secondary | ICD-10-CM | POA: Diagnosis present

## 2014-11-12 DIAGNOSIS — G43909 Migraine, unspecified, not intractable, without status migrainosus: Secondary | ICD-10-CM | POA: Diagnosis present

## 2014-11-12 DIAGNOSIS — A419 Sepsis, unspecified organism: Secondary | ICD-10-CM | POA: Diagnosis not present

## 2014-11-12 DIAGNOSIS — H409 Unspecified glaucoma: Secondary | ICD-10-CM | POA: Diagnosis present

## 2014-11-12 DIAGNOSIS — N10 Acute tubulo-interstitial nephritis: Secondary | ICD-10-CM | POA: Diagnosis present

## 2014-11-12 DIAGNOSIS — I10 Essential (primary) hypertension: Secondary | ICD-10-CM | POA: Diagnosis present

## 2014-11-12 DIAGNOSIS — Z7901 Long term (current) use of anticoagulants: Secondary | ICD-10-CM

## 2014-11-12 DIAGNOSIS — E785 Hyperlipidemia, unspecified: Secondary | ICD-10-CM | POA: Diagnosis present

## 2014-11-12 DIAGNOSIS — Z9841 Cataract extraction status, right eye: Secondary | ICD-10-CM

## 2014-11-12 DIAGNOSIS — Z7982 Long term (current) use of aspirin: Secondary | ICD-10-CM

## 2014-11-12 DIAGNOSIS — Z9842 Cataract extraction status, left eye: Secondary | ICD-10-CM

## 2014-11-12 DIAGNOSIS — Z87442 Personal history of urinary calculi: Secondary | ICD-10-CM

## 2014-11-12 DIAGNOSIS — I252 Old myocardial infarction: Secondary | ICD-10-CM

## 2014-11-12 LAB — CBC WITH DIFFERENTIAL/PLATELET
Basophils Absolute: 0.1 10*3/uL (ref 0.0–0.1)
Basophils Relative: 0 % (ref 0–1)
Eosinophils Absolute: 0 10*3/uL (ref 0.0–0.7)
Eosinophils Relative: 0 % (ref 0–5)
HCT: 38 % (ref 36.0–46.0)
Hemoglobin: 12.6 g/dL (ref 12.0–15.0)
Lymphocytes Relative: 3 % — ABNORMAL LOW (ref 12–46)
Lymphs Abs: 0.5 10*3/uL — ABNORMAL LOW (ref 0.7–4.0)
MCH: 29 pg (ref 26.0–34.0)
MCHC: 33.2 g/dL (ref 30.0–36.0)
MCV: 87.6 fL (ref 78.0–100.0)
Monocytes Absolute: 0.7 10*3/uL (ref 0.1–1.0)
Monocytes Relative: 4 % (ref 3–12)
Neutro Abs: 18.5 10*3/uL — ABNORMAL HIGH (ref 1.7–7.7)
Neutrophils Relative %: 93 % — ABNORMAL HIGH (ref 43–77)
Platelets: 250 10*3/uL (ref 150–400)
RBC: 4.34 MIL/uL (ref 3.87–5.11)
RDW: 15.4 % (ref 11.5–15.5)
WBC: 19.8 10*3/uL — ABNORMAL HIGH (ref 4.0–10.5)

## 2014-11-12 LAB — COMPREHENSIVE METABOLIC PANEL
ALT: 34 U/L (ref 14–54)
AST: 49 U/L — ABNORMAL HIGH (ref 15–41)
Albumin: 3.1 g/dL — ABNORMAL LOW (ref 3.5–5.0)
Alkaline Phosphatase: 94 U/L (ref 38–126)
Anion gap: 12 (ref 5–15)
BUN: 53 mg/dL — ABNORMAL HIGH (ref 6–20)
CO2: 19 mmol/L — ABNORMAL LOW (ref 22–32)
Calcium: 9.2 mg/dL (ref 8.9–10.3)
Chloride: 101 mmol/L (ref 101–111)
Creatinine, Ser: 4.88 mg/dL — ABNORMAL HIGH (ref 0.44–1.00)
GFR calc Af Amer: 10 mL/min — ABNORMAL LOW (ref >60)
GFR calc non Af Amer: 8 mL/min — ABNORMAL LOW (ref >60)
Glucose, Bld: 172 mg/dL — ABNORMAL HIGH (ref 65–99)
Potassium: 4.6 mmol/L (ref 3.5–5.1)
Sodium: 132 mmol/L — ABNORMAL LOW (ref 135–145)
Total Bilirubin: 0.4 mg/dL (ref 0.3–1.2)
Total Protein: 6.4 g/dL — ABNORMAL LOW (ref 6.5–8.1)

## 2014-11-12 LAB — LIPASE, BLOOD: Lipase: 16 U/L — ABNORMAL LOW (ref 22–51)

## 2014-11-12 MED ORDER — ONDANSETRON 4 MG PO TBDP
8.0000 mg | ORAL_TABLET | Freq: Once | ORAL | Status: AC
  Administered 2014-11-12: 8 mg via ORAL
  Filled 2014-11-12: qty 2

## 2014-11-12 MED ORDER — OXYCODONE-ACETAMINOPHEN 5-325 MG PO TABS
1.0000 | ORAL_TABLET | Freq: Once | ORAL | Status: AC
Start: 2014-11-12 — End: 2014-11-12
  Administered 2014-11-12: 1 via ORAL
  Filled 2014-11-12: qty 1

## 2014-11-12 NOTE — ED Notes (Signed)
The opt takes percocet at home it makes her nauseated

## 2014-11-12 NOTE — ED Notes (Signed)
The pt has a known kidney stone on the riught side since may 4th.  More pain tonight rt flank.  Nausea vomiting. Percocet  4 hours ago

## 2014-11-12 NOTE — ED Notes (Signed)
Scheduled for klithotripsy July 3rd

## 2014-11-13 ENCOUNTER — Emergency Department (HOSPITAL_COMMUNITY): Payer: Medicare Other

## 2014-11-13 ENCOUNTER — Encounter (HOSPITAL_COMMUNITY): Payer: Self-pay | Admitting: General Practice

## 2014-11-13 DIAGNOSIS — A419 Sepsis, unspecified organism: Secondary | ICD-10-CM | POA: Diagnosis present

## 2014-11-13 DIAGNOSIS — E785 Hyperlipidemia, unspecified: Secondary | ICD-10-CM | POA: Diagnosis present

## 2014-11-13 DIAGNOSIS — I10 Essential (primary) hypertension: Secondary | ICD-10-CM | POA: Diagnosis not present

## 2014-11-13 DIAGNOSIS — Z9049 Acquired absence of other specified parts of digestive tract: Secondary | ICD-10-CM | POA: Diagnosis present

## 2014-11-13 DIAGNOSIS — Z961 Presence of intraocular lens: Secondary | ICD-10-CM | POA: Diagnosis present

## 2014-11-13 DIAGNOSIS — G43909 Migraine, unspecified, not intractable, without status migrainosus: Secondary | ICD-10-CM | POA: Diagnosis present

## 2014-11-13 DIAGNOSIS — Z8709 Personal history of other diseases of the respiratory system: Secondary | ICD-10-CM | POA: Diagnosis not present

## 2014-11-13 DIAGNOSIS — Z9842 Cataract extraction status, left eye: Secondary | ICD-10-CM | POA: Diagnosis not present

## 2014-11-13 DIAGNOSIS — I129 Hypertensive chronic kidney disease with stage 1 through stage 4 chronic kidney disease, or unspecified chronic kidney disease: Secondary | ICD-10-CM | POA: Diagnosis present

## 2014-11-13 DIAGNOSIS — Z90710 Acquired absence of both cervix and uterus: Secondary | ICD-10-CM | POA: Diagnosis not present

## 2014-11-13 DIAGNOSIS — Z87440 Personal history of urinary (tract) infections: Secondary | ICD-10-CM | POA: Diagnosis not present

## 2014-11-13 DIAGNOSIS — N10 Acute tubulo-interstitial nephritis: Secondary | ICD-10-CM | POA: Diagnosis present

## 2014-11-13 DIAGNOSIS — I252 Old myocardial infarction: Secondary | ICD-10-CM | POA: Diagnosis not present

## 2014-11-13 DIAGNOSIS — E871 Hypo-osmolality and hyponatremia: Secondary | ICD-10-CM | POA: Diagnosis present

## 2014-11-13 DIAGNOSIS — N183 Chronic kidney disease, stage 3 (moderate): Secondary | ICD-10-CM | POA: Diagnosis present

## 2014-11-13 DIAGNOSIS — M1990 Unspecified osteoarthritis, unspecified site: Secondary | ICD-10-CM | POA: Diagnosis present

## 2014-11-13 DIAGNOSIS — Z8701 Personal history of pneumonia (recurrent): Secondary | ICD-10-CM | POA: Diagnosis not present

## 2014-11-13 DIAGNOSIS — D72829 Elevated white blood cell count, unspecified: Secondary | ICD-10-CM | POA: Diagnosis not present

## 2014-11-13 DIAGNOSIS — H409 Unspecified glaucoma: Secondary | ICD-10-CM | POA: Diagnosis present

## 2014-11-13 DIAGNOSIS — Z7901 Long term (current) use of anticoagulants: Secondary | ICD-10-CM | POA: Diagnosis not present

## 2014-11-13 DIAGNOSIS — R509 Fever, unspecified: Secondary | ICD-10-CM | POA: Diagnosis present

## 2014-11-13 DIAGNOSIS — N179 Acute kidney failure, unspecified: Secondary | ICD-10-CM | POA: Diagnosis present

## 2014-11-13 DIAGNOSIS — I480 Paroxysmal atrial fibrillation: Secondary | ICD-10-CM | POA: Diagnosis present

## 2014-11-13 DIAGNOSIS — Z7982 Long term (current) use of aspirin: Secondary | ICD-10-CM | POA: Diagnosis not present

## 2014-11-13 DIAGNOSIS — Z87442 Personal history of urinary calculi: Secondary | ICD-10-CM | POA: Diagnosis not present

## 2014-11-13 DIAGNOSIS — Z888 Allergy status to other drugs, medicaments and biological substances status: Secondary | ICD-10-CM | POA: Diagnosis not present

## 2014-11-13 DIAGNOSIS — K219 Gastro-esophageal reflux disease without esophagitis: Secondary | ICD-10-CM | POA: Diagnosis present

## 2014-11-13 DIAGNOSIS — E860 Dehydration: Secondary | ICD-10-CM | POA: Diagnosis present

## 2014-11-13 DIAGNOSIS — Z9841 Cataract extraction status, right eye: Secondary | ICD-10-CM | POA: Diagnosis not present

## 2014-11-13 DIAGNOSIS — I509 Heart failure, unspecified: Secondary | ICD-10-CM | POA: Diagnosis present

## 2014-11-13 LAB — CBC
HCT: 34.7 % — ABNORMAL LOW (ref 36.0–46.0)
Hemoglobin: 11 g/dL — ABNORMAL LOW (ref 12.0–15.0)
MCH: 27.8 pg (ref 26.0–34.0)
MCHC: 31.7 g/dL (ref 30.0–36.0)
MCV: 87.6 fL (ref 78.0–100.0)
Platelets: 180 10*3/uL (ref 150–400)
RBC: 3.96 MIL/uL (ref 3.87–5.11)
RDW: 15.8 % — ABNORMAL HIGH (ref 11.5–15.5)
WBC: 11.9 10*3/uL — ABNORMAL HIGH (ref 4.0–10.5)

## 2014-11-13 LAB — CREATININE, SERUM
Creatinine, Ser: 4.33 mg/dL — ABNORMAL HIGH (ref 0.44–1.00)
GFR calc Af Amer: 11 mL/min — ABNORMAL LOW (ref >60)
GFR calc non Af Amer: 10 mL/min — ABNORMAL LOW (ref >60)

## 2014-11-13 LAB — URINALYSIS, ROUTINE W REFLEX MICROSCOPIC
Glucose, UA: NEGATIVE mg/dL
Ketones, ur: NEGATIVE mg/dL
Nitrite: POSITIVE — AB
Protein, ur: 100 mg/dL — AB
Specific Gravity, Urine: 1.017 (ref 1.005–1.030)
Urobilinogen, UA: 1 mg/dL (ref 0.0–1.0)
pH: 5.5 (ref 5.0–8.0)

## 2014-11-13 LAB — I-STAT CG4 LACTIC ACID, ED: Lactic Acid, Venous: 0.9 mmol/L (ref 0.5–2.0)

## 2014-11-13 LAB — URINE MICROSCOPIC-ADD ON

## 2014-11-13 LAB — SODIUM, URINE, RANDOM: Sodium, Ur: 68 mmol/L

## 2014-11-13 MED ORDER — CEFEPIME HCL 1 G IJ SOLR
1.0000 g | INTRAMUSCULAR | Status: DC
  Administered 2014-11-13 – 2014-11-17 (×5): 1 g via INTRAVENOUS
  Filled 2014-11-13 (×5): qty 1

## 2014-11-13 MED ORDER — SODIUM CHLORIDE 0.9 % IV BOLUS (SEPSIS)
1000.0000 mL | Freq: Once | INTRAVENOUS | Status: AC
  Administered 2014-11-13: 1000 mL via INTRAVENOUS

## 2014-11-13 MED ORDER — TIMOLOL MALEATE 0.5 % OP SOLN
1.0000 [drp] | Freq: Every day | OPHTHALMIC | Status: DC
  Administered 2014-11-13 – 2014-11-17 (×5): 1 [drp] via OPHTHALMIC
  Filled 2014-11-13: qty 5

## 2014-11-13 MED ORDER — SODIUM CHLORIDE 0.9 % IV SOLN: Freq: Once | INTRAVENOUS | Status: DC

## 2014-11-13 MED ORDER — LATANOPROST 0.005 % OP SOLN
1.0000 [drp] | Freq: Every day | OPHTHALMIC | Status: DC
  Administered 2014-11-13 – 2014-11-16 (×4): 1 [drp] via OPHTHALMIC
  Filled 2014-11-13 (×2): qty 2.5

## 2014-11-13 MED ORDER — DEXTROSE 5 % IV SOLN: 2.0000 g | Freq: Once | INTRAVENOUS | Status: DC

## 2014-11-13 MED ORDER — ACETAMINOPHEN 325 MG PO TABS
325.0000 mg | ORAL_TABLET | Freq: Four times a day (QID) | ORAL | Status: DC | PRN
Start: 2014-11-13 — End: 2014-11-17
  Administered 2014-11-13: 650 mg via ORAL
  Filled 2014-11-13 (×2): qty 2

## 2014-11-13 MED ORDER — SODIUM CHLORIDE 0.9 % IV SOLN
INTRAVENOUS | Status: DC
  Administered 2014-11-13 – 2014-11-14 (×3): via INTRAVENOUS
  Administered 2014-11-14 – 2014-11-15 (×2): 100 mL/h via INTRAVENOUS
  Administered 2014-11-15 – 2014-11-17 (×5): via INTRAVENOUS

## 2014-11-13 MED ORDER — ASPIRIN EC 81 MG PO TBEC
81.0000 mg | DELAYED_RELEASE_TABLET | Freq: Every morning | ORAL | Status: DC
  Administered 2014-11-13 – 2014-11-15 (×3): 81 mg via ORAL
  Filled 2014-11-13 (×4): qty 1

## 2014-11-13 MED ORDER — PANTOPRAZOLE SODIUM 40 MG PO TBEC
40.0000 mg | DELAYED_RELEASE_TABLET | Freq: Every day | ORAL | Status: DC
  Administered 2014-11-13 – 2014-11-17 (×5): 40 mg via ORAL
  Filled 2014-11-13 (×4): qty 1

## 2014-11-13 MED ORDER — ZINC SULFATE 220 (50 ZN) MG PO CAPS
220.0000 mg | ORAL_CAPSULE | Freq: Every day | ORAL | Status: DC | PRN
  Filled 2014-11-13: qty 1

## 2014-11-13 MED ORDER — OXYCODONE-ACETAMINOPHEN 5-325 MG PO TABS
1.0000 | ORAL_TABLET | Freq: Four times a day (QID) | ORAL | Status: DC | PRN
  Administered 2014-11-13 – 2014-11-14 (×2): 1 via ORAL
  Administered 2014-11-15: 2 via ORAL
  Filled 2014-11-13: qty 2
  Filled 2014-11-13 (×2): qty 1

## 2014-11-13 MED ORDER — ONDANSETRON HCL 4 MG/2ML IJ SOLN
4.0000 mg | Freq: Four times a day (QID) | INTRAMUSCULAR | Status: DC | PRN
  Administered 2014-11-13 – 2014-11-14 (×2): 4 mg via INTRAVENOUS
  Filled 2014-11-13 (×2): qty 2

## 2014-11-13 MED ORDER — SODIUM CHLORIDE 0.9 % IV SOLN
Freq: Once | INTRAVENOUS | Status: AC
  Administered 2014-11-13: 07:00:00 via INTRAVENOUS

## 2014-11-13 MED ORDER — ACETAMINOPHEN 325 MG PO TABS
650.0000 mg | ORAL_TABLET | Freq: Once | ORAL | Status: AC
  Administered 2014-11-13: 650 mg via ORAL
  Filled 2014-11-13: qty 2

## 2014-11-13 MED ORDER — HEPARIN SODIUM (PORCINE) 5000 UNIT/ML IJ SOLN
5000.0000 [IU] | Freq: Three times a day (TID) | INTRAMUSCULAR | Status: DC
  Administered 2014-11-13 – 2014-11-17 (×12): 5000 [IU] via SUBCUTANEOUS
  Filled 2014-11-13 (×15): qty 1

## 2014-11-13 MED ORDER — ONDANSETRON HCL 4 MG PO TABS
4.0000 mg | ORAL_TABLET | Freq: Four times a day (QID) | ORAL | Status: DC | PRN
  Administered 2014-11-13 – 2014-11-15 (×2): 4 mg via ORAL
  Filled 2014-11-13 (×2): qty 1

## 2014-11-13 MED ORDER — ADULT MULTIVITAMIN W/MINERALS CH
1.0000 | ORAL_TABLET | Freq: Every day | ORAL | Status: DC
  Filled 2014-11-13 (×5): qty 1

## 2014-11-13 MED ORDER — BISACODYL 10 MG RE SUPP: 10.0000 mg | Freq: Every day | RECTAL | Status: DC | PRN

## 2014-11-13 MED ORDER — FLUTICASONE PROPIONATE 50 MCG/ACT NA SUSP
2.0000 | Freq: Every day | NASAL | Status: DC | PRN
  Filled 2014-11-13: qty 16

## 2014-11-13 MED ORDER — POLYETHYLENE GLYCOL 3350 17 G PO PACK
17.0000 g | PACK | Freq: Every day | ORAL | Status: DC | PRN
  Filled 2014-11-13: qty 1

## 2014-11-13 NOTE — ED Notes (Signed)
Patient transported to X-ray 

## 2014-11-13 NOTE — ED Notes (Signed)
Pharmacy notified that Maxipime is needed STAT and out in ED pyxis. Georgina Snell pharmacist states it will be sent from main pharmacy.

## 2014-11-13 NOTE — ED Notes (Signed)
Attempted to call report

## 2014-11-13 NOTE — H&P (Signed)
Patient Demographics  Emerita Bonis, is a 69 y.o. female  MRN: [id number]   DOB - [birth year]  Admit Date - 11/12/2014  Outpatient Primary MD for the patient is [name], MD   With History of -  Past Medical History  Diagnosis Date  . Hypertension   . GERD (gastroesophageal reflux disease)   . Glaucoma   . H/O renal calculi 2002 & 2006  . H/O hiatal hernia   . Headache(784.0)     migraine  . Pneumonia     dx 10-06-2014 per CXR--  on 10-27-2014 pt states finished antibiotic and denies cough or fever  . Hyperlipidemia   . History of MI (myocardial infarction)     10/ 2013 in setting of Septic Shock  . History of atrial fibrillation     10/ 2013  in setting of Septic Shock  . History of CHF (congestive heart failure)     10/ 2013 in setting of septic shock  . Sigmoid diverticulosis   . Nephrolithiasis     bilateral  . CKD (chronic kidney disease), stage III     nephrologist-  dr Florene Glen  . Right ureteral stone   . History of acute respiratory failure     10/ 2013  -- ventilated in setting septic shock  . Dysrhythmia     Afib in 2013 when she had septic shock related to stone ureteral obstruction  . Myocardial infarction     Was reported in 2013 during hospitalization with septic shock      Past Surgical History  Procedure Laterality Date  . Cystoscopy w/ ureteral stent placement  04/04/2012    Procedure: CYSTOSCOPY WITH RETROGRADE PYELOGRAM/URETERAL STENT PLACEMENT;  Surgeon: Ailene Rud, MD;  Location: [facility];  Service: Urology;  Laterality: Left;  . Total abdominal hysterectomy w/ bilateral salpingoophorectomy  1993    secondary to fibroids  . Breast biopsy Left 08/23/07    benign fibrocystic with duct ectasia  . Cardiac catheterization  07-11-2012  dr Irish Lack    Abnormal stress test/   normal coronary arteries/  LVEDP  23mmHg  . Cardiovascular stress test  06-26-2012  dr Irish Lack    marked ischemia in the basal anterior, mid anterior, apical  inferior regions/  normal LVF, ef 63%  . Transthoracic echocardiogram  04-09-2012    normal LVF,  ef 60-65%,  mild LAE,  mild TR, trivial MR and PR  . Cataract extraction w/ intraocular lens  implant, bilateral    . Knee arthroscopy Left 02-14-2003  . Laparoscopic cholecystectomy  03-23-2005  . Extracorporeal shock wave lithotripsy  05-28-2012  &  10-08-2012  . Cystoscopy with stent placement Right 10/28/2014    Procedure: RIGHT URETERAL STENT PLACEMENT;  Surgeon: Irine Seal, MD;  Location: [facility];  Service: Urology;  Laterality: Right;    in for   Chief Complaint  Patient presents with  . Flank Pain     HPI  Abegail Mccalvin  is a 69 y.o. female, past medical history of hypertension, glaucoma, atrial fibrillation, hyperlipidemia, GERD, and history of recurrent renal stones in the past requiring lithotripsy and renal stents placements, showed primarily by Dr. Gaynelle Arabian, patient presents with complaints of right flank pain, fever, chills, nausea and vomiting, patient had recent right ureteral stent put by Dr. Gaynelle Arabian on 5/10 for right kidney stones with hydronephrosis, treated initially with Cipro, then transitioned to Bactrim,( in 2013 patient had an episode of bacteremia, and pyelonephritis secondary to Escherichia coli), NAD  patient was noticed to be in acute on chronic renal failure with baseline creatinine is 1.8, creatinine today was 4.8, renal ultrasound obtained, showing stable bilateral hydronephrosis, urinalysis was significant for UTI, evaluated the patient in ED, and requested admission to hospitalist service for acute renal failure, and acute pyelonephritis, urology evaluation obtained in ED, they think stent is functioning appropriately.    Review of Systems    In addition to the HPI above,  Reports fever and chills No Headache, No changes with Vision or hearing, No problems swallowing food or Liquids, No Chest pain, Cough or Shortness of  Breath, Reports right flank pain, nausea and vomiting, Bowel movements are regular, No Blood in stool or Urine, No dysuria, No new skin rashes or bruises, No new joints pains-aches,  No new weakness, tingling, numbness in any extremity, No recent weight gain or loss, No polyuria, polydypsia or polyphagia, No significant Mental Stressors.  A full 10 point Review of Systems was done, except as stated above, all other Review of Systems were negative.   Social History History  Substance Use Topics  . Smoking status: Never Smoker   . Smokeless tobacco: Never Used  . Alcohol Use: No     Family History Family History  Problem Relation Age of Onset  . Hypertension Mother   . Cancer Mother 4    breast  . Dementia Mother   . Hypertension Brother   . Diabetes Brother   . Cancer Father 20    pancreatic  . Heart failure Paternal Grandmother   . Bladder Cancer Maternal Grandfather      Prior to Admission medications   Medication Sig Start Date End Date Taking? Authorizing Provider  amLODipine (NORVASC) 5 MG tablet Take 5 mg by mouth every evening.   Yes Historical Provider, MD  aspirin EC 81 MG tablet Take 81 mg by mouth every morning.   Yes Historical Provider, MD  bimatoprost (LUMIGAN) 0.01 % SOLN Place 1 drop into both eyes at bedtime.   Yes Historical Provider, MD  fluticasone (FLONASE) 50 MCG/ACT nasal spray Place 2 sprays into both nostrils daily as needed for allergies or rhinitis.   Yes Historical Provider, MD  metoprolol tartrate (LOPRESSOR) 25 MG tablet TAKE 1 TABLET BY MOUTH TWICE DAILY *NEED OFFICE VISIT FOR FURTHER REFILLS, PER MD* 10/27/14  Yes Jettie Booze, MD  oxyCODONE-acetaminophen (PERCOCET) 5-325 MG per tablet Take 1-2 tablets by mouth every 6 (six) hours as needed for severe pain. 10/27/14  Yes Robyn M Hess, PA-C  ranitidine (ZANTAC) 150 MG tablet Take 150 mg by mouth 2 (two) times daily.   Yes Historical Provider, MD  sulfamethoxazole-trimethoprim (BACTRIM  DS,SEPTRA DS) 800-160 MG per tablet Take 1 tablet by mouth daily.   Yes Historical Provider, MD  timolol (BETIMOL) 0.5 % ophthalmic solution Place 1 drop into both eyes daily.   Yes Historical Provider, MD  Multiple Vitamin (MULTIVITAMIN WITH MINERALS) TABS Take 1 tablet by mouth daily.    Historical Provider, MD  zinc gluconate 50 MG tablet Take 50 mg by mouth daily as needed (for zinc deficiency).     Historical Provider, MD    Allergies  Allergen Reactions  . Vicodin [Hydrocodone-Acetaminophen] Nausea And Vomiting    Physical Exam  Vitals  Blood pressure 101/45, pulse 95, temperature 98.7 F (37.1 C), temperature source Oral, resp. rate 19, height 5\' 3"  (1.6 m), weight 97.206 kg (214 lb 4.8 oz), last menstrual period 01/19/1992, SpO2 92 %.   1. General well-nourished female  lying in bed in NAD.  2. Normal affect and insight, Not Suicidal or Homicidal, Awake Alert, Oriented X 3.  3. No F.N deficits, ALL C.Nerves Intact, Strength 5/5 all 4 extremities, Sensation intact all 4 extremities, Plantars down going.  4. Ears and Eyes appear Normal, Conjunctivae clear, PERRLA. Dry Oral Mucosa.  5. Supple Neck, No JVD, No cervical lymphadenopathy appriciated, No Carotid Bruits.  6. Symmetrical Chest wall movement, Good air movement bilaterally, CTAB.  7. Tachycardic, No Gallops, Rubs or Murmurs, No Parasternal Heave.  8. Positive Bowel Sounds, Abdomen Soft, No tenderness, No organomegaly appriciated,No rebound -guarding or rigidity. Right CVA tenderness  9.  No Cyanosis, Normal Skin Turgor, No Skin Rash or Bruise.  10. Good muscle tone,  joints appear normal , no effusions, Normal ROM.  11. No Palpable Lymph Nodes in Neck or Axillae    Data Review  CBC  Recent Labs Lab 11/12/14 2207  WBC 19.8*  HGB 12.6  HCT 38.0  PLT 250  MCV 87.6  MCH 29.0  MCHC 33.2  RDW 15.4  LYMPHSABS 0.5*  MONOABS 0.7  EOSABS 0.0  BASOSABS 0.1    ------------------------------------------------------------------------------------------------------------------  Chemistries   Recent Labs Lab 11/12/14 2207  NA 132*  K 4.6  CL 101  CO2 19*  GLUCOSE 172*  BUN 53*  CREATININE 4.88*  CALCIUM 9.2  AST 49*  ALT 34  ALKPHOS 94  BILITOT 0.4   ------------------------------------------------------------------------------------------------------------------ estimated creatinine clearance is 12.1 mL/min (by C-G formula based on Cr of 4.88). ------------------------------------------------------------------------------------------------------------------ No results for input(s): TSH, T4TOTAL, T3FREE, THYROIDAB in the last 72 hours.  Invalid input(s): FREET3   Coagulation profile No results for input(s): INR, PROTIME in the last 168 hours. ------------------------------------------------------------------------------------------------------------------- No results for input(s): DDIMER in the last 72 hours. -------------------------------------------------------------------------------------------------------------------  Cardiac Enzymes No results for input(s): CKMB, TROPONINI, MYOGLOBIN in the last 168 hours.  Invalid input(s): CK ------------------------------------------------------------------------------------------------------------------ Invalid input(s): POCBNP   ---------------------------------------------------------------------------------------------------------------  Urinalysis    Component Value Date/Time   COLORURINE ORANGE* 11/13/2014 0125   APPEARANCEUR TURBID* 11/13/2014 0125   LABSPEC 1.017 11/13/2014 0125   PHURINE 5.5 11/13/2014 0125   GLUCOSEU NEGATIVE 11/13/2014 0125   HGBUR LARGE* 11/13/2014 0125   BILIRUBINUR SMALL* 11/13/2014 0125   BILIRUBINUR neg 09/18/2014 0920   KETONESUR NEGATIVE 11/13/2014 0125   PROTEINUR 100* 11/13/2014 0125   PROTEINUR 2+ 09/18/2014 0920   UROBILINOGEN 1.0  11/13/2014 0125   UROBILINOGEN negative 09/18/2014 0920   NITRITE POSITIVE* 11/13/2014 0125   NITRITE neg 09/18/2014 0920   LEUKOCYTESUR LARGE* 11/13/2014 0125    ----------------------------------------------------------------------------------------------------------------  Imaging results:   Dg Abd 1 View  11/13/2014   CLINICAL DATA:  Flank pain with blood in urine.  EXAM: ABDOMEN - 1 VIEW  COMPARISON:  11/10/2014 abdominal CT  FINDINGS: A right ureteral stent has similar positioning to CT 11/10/2014. Known right nephrolithiasis is difficult to visualize due to faint density. Punctate densities over the left kidney move and are likely within small bowel. No evidence of ureteral calculus.  IMPRESSION: 1. Right internal ureteral stent, positioning unremarkable and stable from CT 3 days ago. 2. Bilateral nephrolithiasis is difficult to visualize radiographically.   Electronically Signed   By: Monte Fantasia M.D.   On: 11/13/2014 06:48   US Renal  11/13/2014   CLINICAL DATA:  Flank pain.  EXAM: RENAL / URINARY TRACT ULTRASOUND COMPLETE  COMPARISON:  Abdominal CT 11/10/2014  FINDINGS: Right Kidney:  Length: 12 cm. Echogenic appearing cortex with cortical thinning to 1 cm. There  is mild hydronephrosis, similar to prior. A previous seen right ureteral stent is subtly visualized in the upper right ureter.  Left Kidney:  Length: 12 cm. Mild hydronephrosis, stable from prior. Borderline echogenic cortex.  Bladder:  Completely decompressed.  IMPRESSION: 1. Mild bilateral hydronephrosis, stable from 11/10/2014. 2. Decompressed urinary bladder.   Electronically Signed   By: Monte Fantasia M.D.   On: 11/13/2014 03:53    EKG pending    Assessment & Plan  Active Problems:   AKI (acute kidney injury)   Glaucoma   Essential hypertension, benign   Atrial fibrillation   Acute pyelonephritis    Sepsis secondary to acute pyelonephritis - Patient is febrile 103 in ED, tachycardic, hypotensive,  tachypneic with leukocytosis, meet sepsis criteria on admission. - Patient with stent placement 10/28/14, stent functioning appropriately by Urology. - Start on cefepime, follow on urine cultures and blood cultures( history of Escherichia coli sepsis and bacteremia in 2013)  Acute on chronic renal failure. - Baseline creatinine is 1.8, today is 4.8, this is most likely in the setting of dehydration and volume depletion as no evidence of urinary retention, or worsening hydronephrosis on renal ultrasound. - We'll continue with aggressive IV fluid, patient is on her third liter in ED, continue on 100 mL/h , will check urine sodium, will repeat BMP in a.m., if no improvement then nephrology will be consulted.  Hyponatremia - Secondary to volume depletion, continue with IV normal saline.  Hypertension - Blood pressure is on the lower side, will hold antihypertensives medication  Glaucoma - Continue with home medication.  Paroxysmal A. Fib - Patient with known history of A. fib in the past, on aspirin, followed by cardiology Dr. Scarlette Calico. - ChadsVasc2 score is 3, will leave chronic anticoagulation decision up to cardiology, she has a follow-up on 11/21/14 with Dr. Scarlette Calico.  DVT Prophylaxis Heparin -   AM Labs Ordered, also please review Full Orders  Family Communication: Admission, patients condition and plan of care including tests being ordered have been discussed with the patient  who indicate understanding and agree with the plan and Code Status.  Code Status   Likely DC to    Condition GUARDED  Time spent in minutes : 60 minutes    Albie Arizpe M.D on 11/13/2014 at 7:36 AM  Between 7am to 7pm - Pager - [phone]  After 7pm go to [email or web address] - password [email or web address]  And look for the night coverage person covering me after hours  [facility] Office  [phone]

## 2014-11-13 NOTE — Progress Notes (Signed)
PT Cancellation Note  Patient Details Name: [name] MRN: [id number] DOB: July 12, 1945   Cancelled Treatment:    Reason Eval/Treat Not Completed: Medical issues which prohibited therapy (bedrest until 915 PM)   Claretha Cooper 11/13/2014, 1:03 PM Tresa Endo PT [phone]

## 2014-11-13 NOTE — ED Notes (Signed)
Called pharmacy second time for antibiotic to be sent, states will send now.

## 2014-11-13 NOTE — Consult Note (Signed)
Urology Consult   Physician requesting consult: Dr. Dina Rich  Reason for consult: Pyelonephrosis  History of Present Illness: [name] is a 69 y.o. with history of nephrolithiasis and prior episode of associated urosepsis who presents with right lower flank pain and high fevers / chills.  She was recently diagnosed with a right sided obstructing UPJ stone and was taken for a right sided ureteral stent by Dr. Jeffie Pollock on 5/10 which was uncomplicated.  She was discharged on Cipro at that time and urine culture grew 50k CFU of mixed bacteria.  She was then seen by Dr. Gaynelle Arabian on 5/19 and had a repeat urine culture that again showed mixed bacteria.  She was then stated on Bactrim DS.  Also had CT at that visit that showed the stent in place with minimal hydro bilaterally.  Over the past few days, she started experiencing suprapubic pain and fullness and then subjective fevers and chills yesterday.  In the ER, she was found to have WBC = 19.8 and Cr  = 4.88 from baseline of 1.8.  Urine consistent with infection.  U/S showed bilateral mild hydro.  KUB showed stent in place.  She required I&O cath for inability to void and 129ml drained.  Urine is cloudy.    In ER, she had fever to 103.  Received dose of Cefepime.  Cultures sent.  Some mild right lower flank/back pain and supra-pubic pain.  Past Medical History  Diagnosis Date  . Hypertension   . GERD (gastroesophageal reflux disease)   . Glaucoma   . H/O renal calculi 2002 & 2006  . H/O hiatal hernia   . Headache(784.0)     migraine  . Pneumonia     dx 10-06-2014 per CXR--  on 10-27-2014 pt states finished antibiotic and denies cough or fever  . Hyperlipidemia   . History of MI (myocardial infarction)     10/ 2013 in setting of Septic Shock  . History of atrial fibrillation     10/ 2013  in setting of Septic Shock  . History of CHF (congestive heart failure)     10/ 2013 in setting of septic shock  . Sigmoid diverticulosis   .  Nephrolithiasis     bilateral  . CKD (chronic kidney disease), stage III     nephrologist-  dr Florene Glen  . Right ureteral stone   . History of acute respiratory failure     10/ 2013  -- ventilated in setting septic shock  . Dysrhythmia     Afib in 2013 when she had septic shock related to stone ureteral obstruction  . Myocardial infarction     Was reported in 2013 during hospitalization with septic shock    Past Surgical History  Procedure Laterality Date  . Cystoscopy w/ ureteral stent placement  04/04/2012    Procedure: CYSTOSCOPY WITH RETROGRADE PYELOGRAM/URETERAL STENT PLACEMENT;  Surgeon: Ailene Rud, MD;  Location: [facility];  Service: Urology;  Laterality: Left;  . Total abdominal hysterectomy w/ bilateral salpingoophorectomy  1993    secondary to fibroids  . Breast biopsy Left 08/23/07    benign fibrocystic with duct ectasia  . Cardiac catheterization  07-11-2012  dr Irish Lack    Abnormal stress test/   normal coronary arteries/  LVEDP  49mmHg  . Cardiovascular stress test  06-26-2012  dr Irish Lack    marked ischemia in the basal anterior, mid anterior, apical inferior regions/  normal LVF, ef 63%  . Transthoracic echocardiogram  04-09-2012    normal  LVF,  ef 60-65%,  mild LAE,  mild TR, trivial MR and PR  . Cataract extraction w/ intraocular lens  implant, bilateral    . Knee arthroscopy Left 02-14-2003  . Laparoscopic cholecystectomy  03-23-2005  . Extracorporeal shock wave lithotripsy  05-28-2012  &  10-08-2012  . Cystoscopy with stent placement Right 10/28/2014    Procedure: RIGHT URETERAL STENT PLACEMENT;  Surgeon: Irine Seal, MD;  Location:  [facility];  Service: Urology;  Laterality: Right;     Current Hospital Medications:  Home meds:    Medication List    ASK your doctor about these medications        amLODipine 5 MG tablet  Commonly known as:  NORVASC  Take 5 mg by mouth every evening.     aspirin EC 81 MG tablet  Take 81 mg by mouth  every morning.     bimatoprost 0.01 % Soln  Commonly known as:  LUMIGAN  Place 1 drop into both eyes at bedtime.     CALCIUM 500 PO  Take 500 mg by mouth daily.     fluticasone 50 MCG/ACT nasal spray  Commonly known as:  FLONASE  Place 2 sprays into both nostrils daily as needed for allergies or rhinitis.     metoprolol tartrate 25 MG tablet  Commonly known as:  LOPRESSOR  TAKE 1 TABLET BY MOUTH TWICE DAILY *NEED OFFICE VISIT FOR FURTHER REFILLS, PER MD*     multivitamin with minerals Tabs tablet  Take 1 tablet by mouth daily.     ondansetron 4 MG disintegrating tablet  Commonly known as:  ZOFRAN ODT  4mg  ODT q4 hours prn nausea/vomit     oxyCODONE-acetaminophen 5-325 MG per tablet  Commonly known as:  PERCOCET  Take 1-2 tablets by mouth every 6 (six) hours as needed for severe pain.     ranitidine 150 MG tablet  Commonly known as:  ZANTAC  Take 150 mg by mouth 2 (two) times daily.     sulfamethoxazole-trimethoprim 800-160 MG per tablet  Commonly known as:  BACTRIM DS,SEPTRA DS  Take 1 tablet by mouth daily.     timolol 0.5 % ophthalmic solution  Commonly known as:  BETIMOL  Place 1 drop into both eyes daily.     zinc gluconate 50 MG tablet  Take 50 mg by mouth daily as needed (for zinc deficiency).        Scheduled Meds:  Continuous Infusions: . sodium chloride 250 mL/hr at 11/13/14 0643  . ceFEPime (MAXIPIME) IV Stopped (11/13/14 0223)   PRN Meds:.  Allergies:  Allergies  Allergen Reactions  . Vicodin [Hydrocodone-Acetaminophen] Nausea And Vomiting    Family History  Problem Relation Age of Onset  . Hypertension Mother   . Cancer Mother 23    breast  . Dementia Mother   . Hypertension Brother   . Diabetes Brother   . Cancer Father 35    pancreatic  . Heart failure Paternal Grandmother   . Bladder Cancer Maternal Grandfather     Social History:  reports that she has never smoked. She has never used smokeless tobacco. She reports that she does  not drink alcohol or use illicit drugs.  ROS: A complete review of systems was performed.  All systems are negative except for pertinent findings as noted.  Physical Exam:  Vital signs in last 24 hours: Temp:  [98.3 F (36.8 C)-103 F (39.4 C)] 101.5 F (38.6 C) (05/26 0645) Pulse Rate:  [89-123] 95 (05/26 0700)  Resp:  [14-31] 19 (05/26 0700) BP: (98-126)/(42-66) 101/45 mmHg (05/26 0700) SpO2:  [92 %-97 %] 92 % (05/26 0700) Weight:  [214 lb 4.8 oz (97.206 kg)] 214 lb 4.8 oz (97.206 kg) (05/26 0035) Constitutional:  Alert and oriented, No acute distress Cardiovascular: Regular rate and rhythm, No JVD Respiratory: Normal respiratory effort, Lungs clear bilaterally GI: Some mild right lower flank/back pain and supra-pubic pain. Lymphatic: No lymphadenopathy Neurologic: Grossly intact, no focal deficits Psychiatric: Normal mood and affect  Laboratory Data:   Recent Labs  11/12/14 2207  WBC 19.8*  HGB 12.6  HCT 38.0  PLT 250     Recent Labs  11/12/14 2207  NA 132*  K 4.6  CL 101  GLUCOSE 172*  BUN 53*  CALCIUM 9.2  CREATININE 4.88*     Results for orders placed or performed during the hospital encounter of 11/12/14 (from the past 24 hour(s))  CBC with Differential     Status: Abnormal   Collection Time: 11/12/14 10:07 PM  Result Value Ref Range   WBC 19.8 (H) 4.0 - 10.5 K/uL   RBC 4.34 3.87 - 5.11 MIL/uL   Hemoglobin 12.6 12.0 - 15.0 g/dL   HCT 38.0 36.0 - 46.0 %   MCV 87.6 78.0 - 100.0 fL   MCH 29.0 26.0 - 34.0 pg   MCHC 33.2 30.0 - 36.0 g/dL   RDW 15.4 11.5 - 15.5 %   Platelets 250 150 - 400 K/uL   Neutrophils Relative % 93 (H) 43 - 77 %   Neutro Abs 18.5 (H) 1.7 - 7.7 K/uL   Lymphocytes Relative 3 (L) 12 - 46 %   Lymphs Abs 0.5 (L) 0.7 - 4.0 K/uL   Monocytes Relative 4 3 - 12 %   Monocytes Absolute 0.7 0.1 - 1.0 K/uL   Eosinophils Relative 0 0 - 5 %   Eosinophils Absolute 0.0 0.0 - 0.7 K/uL   Basophils Relative 0 0 - 1 %   Basophils Absolute 0.1  0.0 - 0.1 K/uL  Comprehensive metabolic panel     Status: Abnormal   Collection Time: 11/12/14 10:07 PM  Result Value Ref Range   Sodium 132 (L) 135 - 145 mmol/L   Potassium 4.6 3.5 - 5.1 mmol/L   Chloride 101 101 - 111 mmol/L   CO2 19 (L) 22 - 32 mmol/L   Glucose, Bld 172 (H) 65 - 99 mg/dL   BUN 53 (H) 6 - 20 mg/dL   Creatinine, Ser 4.88 (H) 0.44 - 1.00 mg/dL   Calcium 9.2 8.9 - 10.3 mg/dL   Total Protein 6.4 (L) 6.5 - 8.1 g/dL   Albumin 3.1 (L) 3.5 - 5.0 g/dL   AST 49 (H) 15 - 41 U/L   ALT 34 14 - 54 U/L   Alkaline Phosphatase 94 38 - 126 U/L   Total Bilirubin 0.4 0.3 - 1.2 mg/dL   GFR calc non Af Amer 8 (L) >60 mL/min   GFR calc Af Amer 10 (L) >60 mL/min   Anion gap 12 5 - 15  Lipase, blood     Status: Abnormal   Collection Time: 11/12/14 10:07 PM  Result Value Ref Range   Lipase 16 (L) 22 - 51 U/L  I-Stat CG4 Lactic Acid, ED     Status: None   Collection Time: 11/13/14 12:25 AM  Result Value Ref Range   Lactic Acid, Venous 0.90 0.5 - 2.0 mmol/L  Urinalysis, Routine w reflex microscopic     Status: Abnormal  Collection Time: 11/13/14  1:25 AM  Result Value Ref Range   Color, Urine ORANGE (A) YELLOW   APPearance TURBID (A) CLEAR   Specific Gravity, Urine 1.017 1.005 - 1.030   pH 5.5 5.0 - 8.0   Glucose, UA NEGATIVE NEGATIVE mg/dL   Hgb urine dipstick LARGE (A) NEGATIVE   Bilirubin Urine SMALL (A) NEGATIVE   Ketones, ur NEGATIVE NEGATIVE mg/dL   Protein, ur 100 (A) NEGATIVE mg/dL   Urobilinogen, UA 1.0 0.0 - 1.0 mg/dL   Nitrite POSITIVE (A) NEGATIVE   Leukocytes, UA LARGE (A) NEGATIVE  Urine microscopic-add on     Status: Abnormal   Collection Time: 11/13/14  1:25 AM  Result Value Ref Range   Squamous Epithelial / LPF RARE RARE   WBC, UA TOO NUMEROUS TO COUNT <3 WBC/hpf   RBC / HPF 11-20 <3 RBC/hpf   Bacteria, UA MANY (A) RARE   No results found for this or any previous visit (from the past 240 hour(s)).  Renal Function:  Recent Labs  11/12/14 2207   CREATININE 4.88*   Estimated Creatinine Clearance: 12.1 mL/min (by C-G formula based on Cr of 4.88).  Radiologic Imaging: Dg Abd 1 View  11/13/2014   CLINICAL DATA:  Flank pain with blood in urine.  EXAM: ABDOMEN - 1 VIEW  COMPARISON:  11/10/2014 abdominal CT  FINDINGS: A right ureteral stent has similar positioning to CT 11/10/2014. Known right nephrolithiasis is difficult to visualize due to faint density. Punctate densities over the left kidney move and are likely within small bowel. No evidence of ureteral calculus.  IMPRESSION: 1. Right internal ureteral stent, positioning unremarkable and stable from CT 3 days ago. 2. Bilateral nephrolithiasis is difficult to visualize radiographically.   Electronically Signed   By: Monte Fantasia M.D.   On: 11/13/2014 06:48   US Renal  11/13/2014   CLINICAL DATA:  Flank pain.  EXAM: RENAL / URINARY TRACT ULTRASOUND COMPLETE  COMPARISON:  Abdominal CT 11/10/2014  FINDINGS: Right Kidney:  Length: 12 cm. Echogenic appearing cortex with cortical thinning to 1 cm. There is mild hydronephrosis, similar to prior. A previous seen right ureteral stent is subtly visualized in the upper right ureter.  Left Kidney:  Length: 12 cm. Mild hydronephrosis, stable from prior. Borderline echogenic cortex.  Bladder:  Completely decompressed.  IMPRESSION: 1. Mild bilateral hydronephrosis, stable from 11/10/2014. 2. Decompressed urinary bladder.   Electronically Signed   By: Monte Fantasia M.D.   On: 11/13/2014 03:53    I independently reviewed the above imaging studies.  Impression/Recommendation: 69 y.o. with history of nephrolithiasis s/p recent right ureteral stent who presents with signs/symptoms consistent with pyelonephritis with acute kidney injury.  Imaging suggests right sided stent is in appropriate position.  U/S showed mild bilateral hydro which is consistent with recent CT.  It appears her stent is in good position.  At this time, we recommend treating as  pyelonephritis.  Should she continue to deteriorate despite antibiotics, we would consider re-imaging with CT and possible percutaneous nephrostomy to maximally decompress kidney.  Acute kidney injury likely related to pre-renal and pyelo.  Do not think obstruction is contributing much at this point.     - Continue foley catheter until afebrile x24 hours  - Continue broad antibiotics and follow urine cultures  - Please page [name] if patient deteriorates despite antibiotics  - Follow cultures

## 2014-11-13 NOTE — ED Notes (Signed)
Pt given sprite 

## 2014-11-13 NOTE — ED Notes (Signed)
Urology at bedside.

## 2014-11-13 NOTE — ED Notes (Signed)
Antibiotic arrival to pod b at [place]

## 2014-11-13 NOTE — ED Notes (Signed)
Pts I stat lactic acid was 1.35, it has not crossed over into the computer. Dr. Dina Rich made aware.

## 2014-11-13 NOTE — ED Notes (Signed)
Per Dr. Dina Rich, Dr. Baltazar Najjar, urology, has requested that the pt have a foley catheter in due to urine retention.

## 2014-11-13 NOTE — Progress Notes (Signed)
ANTIBIOTIC CONSULT NOTE - INITIAL  Pharmacy Consult for Cefepime Indication: urosepsis  Allergies  Allergen Reactions  . Vicodin [Hydrocodone-Acetaminophen] Nausea And Vomiting    Patient Measurements:   Adjusted Body Weight:   Vital Signs: Temp: 100.3 F (37.9 C) (05/25 2154) Temp Source: Oral (05/25 2154) BP: 120/66 mmHg (05/26 0017) Pulse Rate: 102 (05/26 0017) Intake/Output from previous day:   Intake/Output from this shift:    Labs:  Recent Labs  11/12/14 2207  WBC 19.8*  HGB 12.6  PLT 250  CREATININE 4.88*   CrCl cannot be calculated (Unknown ideal weight.). No results for input(s): VANCOTROUGH, VANCOPEAK, VANCORANDOM, GENTTROUGH, GENTPEAK, GENTRANDOM, TOBRATROUGH, TOBRAPEAK, TOBRARND, AMIKACINPEAK, AMIKACINTROU, AMIKACIN in the last 72 hours.   Microbiology: Recent Results (from the past 720 hour(s))  Urine culture     Status: None   Collection Time: 10/27/14  7:27 AM  Result Value Ref Range Status   Specimen Description URINE, RANDOM  Final   Special Requests NONE  Final   Colony Count   Final    50,000 COLONIES/ML Performed at [facility]    Culture   Final    Multiple bacterial morphotypes present, none predominant. Suggest appropriate recollection if clinically indicated. Performed at [facility]    Report Status 10/28/2014 FINAL  Final    Medical History: Past Medical History  Diagnosis Date  . Hypertension   . GERD (gastroesophageal reflux disease)   . Glaucoma   . H/O renal calculi 2002 & 2006  . H/O hiatal hernia   . Headache(784.0)     migraine  . Pneumonia     dx 10-06-2014 per CXR--  on 10-27-2014 pt states finished antibiotic and denies cough or fever  . Hyperlipidemia   . History of MI (myocardial infarction)     10/ 2013 in setting of Septic Shock  . History of atrial fibrillation     10/ 2013  in setting of Septic Shock  . History of CHF (congestive heart failure)     10/ 2013 in setting of septic shock   . Sigmoid diverticulosis   . Nephrolithiasis     bilateral  . CKD (chronic kidney disease), stage III     nephrologist-  dr Florene Glen  . Right ureteral stone   . History of acute respiratory failure     10/ 2013  -- ventilated in setting septic shock  . Dysrhythmia     Afib in 2013 when she had septic shock related to stone ureteral obstruction  . Myocardial infarction     Was reported in 2013 during hospitalization with septic shock    Medications:   (Not in a hospital admission) Scheduled:   Infusions:  . ceFEPime (MAXIPIME) IV    . sodium chloride     Assessment: 69yo female with history of CKD3 and nephrolithiasis presents with flank pain. Pharmacy is consulted to dose cefepime for suspected urosepsis. Pt is febrile to 100.3, WBC 19.8, sCr 4.9, LA 0.9.  Goal of Therapy:  Eradication of infection  Plan:  Cefepime 1g IV q24h Follow up culture results, renal function, and clinical course  Andrey Cota. Diona Foley, PharmD Clinical Pharmacist Pager [phone] 11/13/2014,12:26 AM

## 2014-11-13 NOTE — Progress Notes (Signed)
Pt admitted to the unit at 0918. Pt mental status is A&OX4. Pt oriented to room, staff, and call bell. Skin is intact other than bruises to legs. Full assessment charted in [facility]. Call bell within reach. Visitor guidelines reviewed w/ pt and/or family.

## 2014-11-13 NOTE — ED Provider Notes (Addendum)
CSN: [id number]     Arrival date & time 11/12/14  2146 History  This chart was scribed for Merryl Hacker, MD by Delphia Grates, ED Scribe. This patient was seen in room B18C/B18C and the patient's care was started at 12:09 AM.   Chief Complaint  Patient presents with  . Flank Pain    The history is provided by the patient. No language interpreter was used.    HPI Comments: [name] is a 69 y.o. female, with history of CKD, who presents to the Emergency Department complaining of constant, 10/10, right flank pain that began tonight. There is associated hematuria for the past month, in addition to nausea and vomiting. She has taken Percocet, 4 hours ago, and states the pain has subsided to a 6/10 since her arrival to the ED. Patient was seen here approximately 2 weeks ago for the same. She reports history of UTI approximately 3 weeks ago and had a right ureteral stent placed 16 days ago on Oct 28, 2014 for a kidney stone. Patient states she has been on Sulfa antibiotics for the past 10 days after having been on ciprofloxacin.Patient has history of kidney stones and is scheduled to have lithotripsy in the next month. She denies fever.  Followed primarily by Dr. Gaynelle Arabian.  Past Medical History  Diagnosis Date  . Hypertension   . GERD (gastroesophageal reflux disease)   . Glaucoma   . H/O renal calculi 2002 & 2006  . H/O hiatal hernia   . Headache(784.0)     migraine  . Pneumonia     dx 10-06-2014 per CXR--  on 10-27-2014 pt states finished antibiotic and denies cough or fever  . Hyperlipidemia   . History of MI (myocardial infarction)     10/ 2013 in setting of Septic Shock  . History of atrial fibrillation     10/ 2013  in setting of Septic Shock  . History of CHF (congestive heart failure)     10/ 2013 in setting of septic shock  . Sigmoid diverticulosis   . Nephrolithiasis     bilateral  . CKD (chronic kidney disease), stage III     nephrologist-  dr Florene Glen  .  Right ureteral stone   . History of acute respiratory failure     10/ 2013  -- ventilated in setting septic shock  . Dysrhythmia     Afib in 2013 when she had septic shock related to stone ureteral obstruction  . Myocardial infarction     Was reported in 2013 during hospitalization with septic shock   Past Surgical History  Procedure Laterality Date  . Cystoscopy w/ ureteral stent placement  04/04/2012    Procedure: CYSTOSCOPY WITH RETROGRADE PYELOGRAM/URETERAL STENT PLACEMENT;  Surgeon: Ailene Rud, MD;  Location: [facility];  Service: Urology;  Laterality: Left;  . Total abdominal hysterectomy w/ bilateral salpingoophorectomy  1993    secondary to fibroids  . Breast biopsy Left 08/23/07    benign fibrocystic with duct ectasia  . Cardiac catheterization  07-11-2012  dr Irish Lack    Abnormal stress test/   normal coronary arteries/  LVEDP  67mmHg  . Cardiovascular stress test  06-26-2012  dr Irish Lack    marked ischemia in the basal anterior, mid anterior, apical inferior regions/  normal LVF, ef 63%  . Transthoracic echocardiogram  04-09-2012    normal LVF,  ef 60-65%,  mild LAE,  mild TR, trivial MR and PR  . Cataract extraction w/ intraocular lens  implant,  bilateral    . Knee arthroscopy Left 02-14-2003  . Laparoscopic cholecystectomy  03-23-2005  . Extracorporeal shock wave lithotripsy  05-28-2012  &  10-08-2012  . Cystoscopy with stent placement Right 10/28/2014    Procedure: RIGHT URETERAL STENT PLACEMENT;  Surgeon: Irine Seal, MD;  Location: [facility];  Service: Urology;  Laterality: Right;   Family History  Problem Relation Age of Onset  . Hypertension Mother   . Cancer Mother 30    breast  . Dementia Mother   . Hypertension Brother   . Diabetes Brother   . Cancer Father 63    pancreatic  . Heart failure Paternal Grandmother   . Bladder Cancer Maternal Grandfather    History  Substance Use Topics  . Smoking status: Never Smoker   . Smokeless  tobacco: Never Used  . Alcohol Use: No   OB History    Gravida Para Term Preterm AB TAB SAB Ectopic Multiple Living   1 1 1       1      Review of Systems  Constitutional: Positive for fever and chills.  Respiratory: Negative for cough, chest tightness and shortness of breath.   Cardiovascular: Negative for chest pain.  Gastrointestinal: Positive for nausea and vomiting. Negative for abdominal pain.  Genitourinary: Positive for dysuria, hematuria and flank pain.  Musculoskeletal: Negative for back pain.  Neurological: Negative for headaches.  Psychiatric/Behavioral: Negative for confusion.  All other systems reviewed and are negative.     Allergies  Vicodin  Home Medications   Prior to Admission medications   Medication Sig Start Date End Date Taking? Authorizing Provider  amLODipine (NORVASC) 5 MG tablet Take 5 mg by mouth every evening.   Yes Historical Provider, MD  aspirin EC 81 MG tablet Take 81 mg by mouth every morning.   Yes Historical Provider, MD  bimatoprost (LUMIGAN) 0.01 % SOLN Place 1 drop into both eyes at bedtime.   Yes Historical Provider, MD  fluticasone (FLONASE) 50 MCG/ACT nasal spray Place 2 sprays into both nostrils daily as needed for allergies or rhinitis.   Yes Historical Provider, MD  metoprolol tartrate (LOPRESSOR) 25 MG tablet TAKE 1 TABLET BY MOUTH TWICE DAILY *NEED OFFICE VISIT FOR FURTHER REFILLS, PER MD* 10/27/14  Yes Jettie Booze, MD  ondansetron (ZOFRAN ODT) 4 MG disintegrating tablet 4mg  ODT q4 hours prn nausea/vomit 10/27/14  Yes Robyn M Hess, PA-C  oxyCODONE-acetaminophen (PERCOCET) 5-325 MG per tablet Take 1-2 tablets by mouth every 6 (six) hours as needed for severe pain. 10/27/14  Yes Robyn M Hess, PA-C  ranitidine (ZANTAC) 150 MG tablet Take 150 mg by mouth 2 (two) times daily.   Yes Historical Provider, MD  sulfamethoxazole-trimethoprim (BACTRIM DS,SEPTRA DS) 800-160 MG per tablet Take 1 tablet by mouth daily.   Yes Historical Provider,  MD  timolol (BETIMOL) 0.5 % ophthalmic solution Place 1 drop into both eyes daily.   Yes Historical Provider, MD  Calcium-Magnesium-Vitamin D (CALCIUM 500 PO) Take 500 mg by mouth daily.    Historical Provider, MD  Multiple Vitamin (MULTIVITAMIN WITH MINERALS) TABS Take 1 tablet by mouth daily.    Historical Provider, MD  zinc gluconate 50 MG tablet Take 50 mg by mouth daily as needed (for zinc deficiency).     Historical Provider, MD   Triage Vitals: BP 116/59 mmHg  Pulse 123  Temp(Src) 100.3 F (37.9 C) (Oral)  Resp 18  SpO2 93%  LMP 01/19/1992  Physical Exam  Constitutional: She is oriented  to person, place, and time. No distress.  Ill-appearing, nontoxic, no acute distress  HENT:  Head: Normocephalic and atraumatic.  Cardiovascular: Regular rhythm and normal heart sounds.   Tachycardia  Pulmonary/Chest: Effort normal and breath sounds normal. No respiratory distress. She has no wheezes.  Abdominal: Soft. Bowel sounds are normal. There is no tenderness. There is no rebound.  Genitourinary:  Right CVA tenderness  Neurological: She is alert and oriented to person, place, and time.  Skin: Skin is warm and dry.  Psychiatric: She has a normal mood and affect.  Nursing note and vitals reviewed.   ED Course  Procedures (including critical care time)  CRITICAL CARE Performed by: Merryl Hacker   Total critical care time: 35 min  Critical care time was exclusive of separately billable procedures and treating other patients.  Critical care was necessary to treat or prevent imminent or life-threatening deterioration.  Critical care was time spent personally by me on the following activities: development of treatment plan with patient and/or surrogate as well as nursing, discussions with consultants, evaluation of patient's response to treatment, examination of patient, obtaining history from patient or surrogate, ordering and performing treatments and interventions, ordering  and review of laboratory studies, ordering and review of radiographic studies, pulse oximetry and re-evaluation of patient's condition.   DIAGNOSTIC STUDIES: Oxygen Saturation is 93% on room air, adequate by my interpretation.    COORDINATION OF CARE: At 0012 Discussed treatment plan with patient which includes ABX. Patient agrees.   Labs Review Labs Reviewed  CBC WITH DIFFERENTIAL/PLATELET - Abnormal; Notable for the following:    WBC 19.8 (*)    Neutrophils Relative % 93 (*)    Neutro Abs 18.5 (*)    Lymphocytes Relative 3 (*)    Lymphs Abs 0.5 (*)    All other components within normal limits  COMPREHENSIVE METABOLIC PANEL - Abnormal; Notable for the following:    Sodium 132 (*)    CO2 19 (*)    Glucose, Bld 172 (*)    BUN 53 (*)    Creatinine, Ser 4.88 (*)    Total Protein 6.4 (*)    Albumin 3.1 (*)    AST 49 (*)    GFR calc non Af Amer 8 (*)    GFR calc Af Amer 10 (*)    All other components within normal limits  URINALYSIS, ROUTINE W REFLEX MICROSCOPIC (NOT AT [facility]) - Abnormal; Notable for the following:    Color, Urine ORANGE (*)    APPearance TURBID (*)    Hgb urine dipstick LARGE (*)    Bilirubin Urine SMALL (*)    Protein, ur 100 (*)    Nitrite POSITIVE (*)    Leukocytes, UA LARGE (*)    All other components within normal limits  LIPASE, BLOOD - Abnormal; Notable for the following:    Lipase 16 (*)    All other components within normal limits  URINE MICROSCOPIC-ADD ON - Abnormal; Notable for the following:    Bacteria, UA MANY (*)    All other components within normal limits  URINE CULTURE  CULTURE, BLOOD (ROUTINE X 2)  CULTURE, BLOOD (ROUTINE X 2)  I-STAT CG4 LACTIC ACID, ED  I-STAT CG4 LACTIC ACID, ED    Imaging Review US Renal  11/13/2014   CLINICAL DATA:  Flank pain.  EXAM: RENAL / URINARY TRACT ULTRASOUND COMPLETE  COMPARISON:  Abdominal CT 11/10/2014  FINDINGS: Right Kidney:  Length: 12 cm. Echogenic appearing cortex with cortical thinning to 1  cm.  There is mild hydronephrosis, similar to prior. A previous seen right ureteral stent is subtly visualized in the upper right ureter.  Left Kidney:  Length: 12 cm. Mild hydronephrosis, stable from prior. Borderline echogenic cortex.  Bladder:  Completely decompressed.  IMPRESSION: 1. Mild bilateral hydronephrosis, stable from 11/10/2014. 2. Decompressed urinary bladder.   Electronically Signed   By: Monte Fantasia M.D.   On: 11/13/2014 03:53     EKG Interpretation None      MDM   Final diagnoses:  Pyelonephritis  Acute kidney injury  Sepsis, due to unspecified organism    Patient presents with right flank pain. History of kidney stones and urinary tract infection. Followed by urology. Patient is febrile and tachycardic on initial evaluation. Ill-appearing with CVA tenderness. Sepsis workup initiated. Blood and urine cultures obtained.  Patient given fluids. Lactate is normal.  Workup notable for nitrite-positive urinary tract infection. Patient given cefepime per protocol. White blood count is 20. Creatinine is acutely elevated at 4.88. Baseline 1.5.  Initially discussed patient with Dr. Dorina Hoyer . Patient just had a CT scan on Monday which showed nonobstructing kidney stone. Dr. Dorina Hoyer is requesting renal ultrasound. Will call the urology resident when imaging is back. Patient has been had very little urinary output. Patient given a second liter of fluids. She is now febrile to 103. Renal ultrasound shows baseline hydronephrosis but no abscess.  4:30 AM Discussed with urology, Dr. Baltazar Najjar. He requests KUB and placement of Foley to ensure good bladder drainage. This is been ordered. Patient will be evaluated by Dr. Baltazar Najjar.  6:00 AM Dr. Baltazar Najjar at the bedside evaluating patient. Concerned the patient has acute pallor nephritis. Stent appears to be in place. No obstructing stone. No urology intervention at this time. Requesting admission to the hospital service for acute pyelonephritis and  dehydration.  I personally performed the services described in this documentation, which was scribed in my presence. The recorded information has been reviewed and is accurate.    Merryl Hacker, MD 11/13/14 [place], MD 11/13/14 [id number]

## 2014-11-14 DIAGNOSIS — N10 Acute tubulo-interstitial nephritis: Secondary | ICD-10-CM

## 2014-11-14 DIAGNOSIS — I480 Paroxysmal atrial fibrillation: Secondary | ICD-10-CM

## 2014-11-14 DIAGNOSIS — D72829 Elevated white blood cell count, unspecified: Secondary | ICD-10-CM

## 2014-11-14 DIAGNOSIS — N179 Acute kidney failure, unspecified: Secondary | ICD-10-CM

## 2014-11-14 LAB — BASIC METABOLIC PANEL
Anion gap: 9 (ref 5–15)
BUN: 42 mg/dL — ABNORMAL HIGH (ref 6–20)
CO2: 16 mmol/L — ABNORMAL LOW (ref 22–32)
Calcium: 8 mg/dL — ABNORMAL LOW (ref 8.9–10.3)
Chloride: 108 mmol/L (ref 101–111)
Creatinine, Ser: 3.3 mg/dL — ABNORMAL HIGH (ref 0.44–1.00)
GFR calc Af Amer: 15 mL/min — ABNORMAL LOW (ref >60)
GFR calc non Af Amer: 13 mL/min — ABNORMAL LOW (ref >60)
Glucose, Bld: 85 mg/dL (ref 65–99)
Potassium: 4.6 mmol/L (ref 3.5–5.1)
Sodium: 133 mmol/L — ABNORMAL LOW (ref 135–145)

## 2014-11-14 LAB — CBC
HCT: 32 % — ABNORMAL LOW (ref 36.0–46.0)
Hemoglobin: 10.4 g/dL — ABNORMAL LOW (ref 12.0–15.0)
MCH: 28.7 pg (ref 26.0–34.0)
MCHC: 32.5 g/dL (ref 30.0–36.0)
MCV: 88.2 fL (ref 78.0–100.0)
Platelets: 187 10*3/uL (ref 150–400)
RBC: 3.63 MIL/uL — ABNORMAL LOW (ref 3.87–5.11)
RDW: 16.2 % — ABNORMAL HIGH (ref 11.5–15.5)
WBC: 7.4 10*3/uL (ref 4.0–10.5)

## 2014-11-14 LAB — URINE CULTURE: Colony Count: 40000

## 2014-11-14 LAB — HEMOGLOBIN A1C
Hgb A1c MFr Bld: 5.6 % (ref 4.8–5.6)
Mean Plasma Glucose: 114 mg/dL

## 2014-11-14 NOTE — Evaluation (Signed)
Physical Therapy Evaluation and Discharge Patient Details Name: [name] MRN: [id number] DOB: [birth year] Today's Date: 11/14/2014   History of Present Illness  69 y.o. female admitted with Sepsis secondary to acute pyelonephritis  Clinical Impression  Patient evaluated by Physical Therapy with no further acute PT needs identified. All education has been completed and the patient has no further questions. Patient reports that she feels very near to her baseline. Did not require any physical assist while ambulating up to 210 feet this AM. Slightly unsteady upon standing which she reports as normal. Encouraged to use her rollator at home, and walk frequently until she feels all of her strength as returned. Will have 24 hour supervision as needed from family. See below for any follow-up Physial Therapy or equipment needs. PT is signing off. Thank you for this referral.     Follow Up Recommendations No PT follow up    Equipment Recommendations  None recommended by PT    Recommendations for Other Services       Precautions / Restrictions Precautions Precautions: Fall Restrictions Weight Bearing Restrictions: No      Mobility  Bed Mobility Overal bed mobility: Modified Independent             General bed mobility comments: requires extra time  Transfers Overall transfer level: Needs assistance Equipment used: None Transfers: Sit to/from Stand Sit to Stand: Supervision         General transfer comment: supervision for safety. Pt with mild sway upon standing. Reaches for furniture. Provided RW for support and balance improved. States she is typically slightly unstable after lying for long periods of time.  Ambulation/Gait Ambulation/Gait assistance: Supervision Ambulation Distance (Feet): 210 Feet Assistive device: Rolling walker (2 wheeled) Gait Pattern/deviations: Step-through pattern;Decreased stride length Gait velocity: decreased   General Gait Details:  Educated on safe DME use with a rolling walker. VC for walker placement for proximity. No loss of balance noted. Slightly rigid initially but muscle tension more relaxed as distance increased. SpO2 92% on room air. HR in 80s-90s  Stairs            Wheelchair Mobility    Modified Rankin (Stroke Patients Only)       Balance Overall balance assessment: Needs assistance Sitting-balance support: No upper extremity supported;Feet supported Sitting balance-Leahy Scale: Good     Standing balance support: No upper extremity supported Standing balance-Leahy Scale: Fair                               Pertinent Vitals/Pain Pain Assessment: No/denies pain    Home Living Family/patient expects to be discharged to:: Private residence Living Arrangements: Children Available Help at Discharge: Family;Available 24 hours/day Type of Home: House Home Access: Stairs to enter Entrance Stairs-Rails:  (holds door frame) Technical brewer of Steps: 1 Home Layout: One level Home Equipment: Cane - single point;Walker - 4 wheels;Tub bench      Prior Function Level of Independence: Independent               Hand Dominance   Dominant Hand: Right    Extremity/Trunk Assessment   Upper Extremity Assessment: Defer to OT evaluation           Lower Extremity Assessment: Overall WFL for tasks assessed         Communication   Communication: No difficulties  Cognition Arousal/Alertness: Awake/alert Behavior During Therapy: WFL for tasks assessed/performed Overall Cognitive Status: Within Functional  Limits for tasks assessed                      General Comments General comments (skin integrity, edema, etc.): Discussed d/c planning and recommendations with patient. States she feels near her baseline level of function. Encouraged to use RW at home until she feels 100% back to baseline. Also recommended pt stay mobile and walk short distances multiple times  per day.    Exercises General Exercises - Lower Extremity Ankle Circles/Pumps: AROM;Both;10 reps;Supine      Assessment/Plan    PT Assessment Patent does not need any further PT services  PT Diagnosis Abnormality of gait   PT Problem List    PT Treatment Interventions     PT Goals (Current goals can be found in the Care Plan section) Acute Rehab PT Goals Patient Stated Goal: go home PT Goal Formulation: All assessment and education complete, DC therapy    Frequency     Barriers to discharge        Co-evaluation               End of Session   Activity Tolerance: Patient tolerated treatment well Patient left: in bed;with call bell/phone within reach;with SCD's reapplied Nurse Communication: Mobility status         Time: [id number] PT Time Calculation (min) (ACUTE ONLY): 21 min   Charges:   PT Evaluation $Initial PT Evaluation Tier I: 1 Procedure     PT G CodesEllouise Newer 11/14/2014, 11:42 AM Elayne Snare, [place]

## 2014-11-14 NOTE — Progress Notes (Signed)
Subjective: The patient reports much improved this pm. Hx of E. Coli UTI. Treated in the past with Cipro, but more recently with septra 2ndary FDA black box warning re: cipro and joint and ligament dmage-and warning not to use cipro unless pt having serious infection.   Objective: Vital signs in last 24 hours: Temp:  [98.1 F (36.7 C)-99.6 F (37.6 C)] 98.4 F (36.9 C) (05/27 1547) Pulse Rate:  [76-102] 76 (05/27 1547) Resp:  [18-25] 18 (05/27 1547) BP: (97-121)/(51-58) 105/51 mmHg (05/27 1547) SpO2:  [96 %-97 %] 97 % (05/27 1547)A  Intake/Output from previous day: 05/26 0701 - 05/27 0700 In: 2170 [P.O.:120; I.V.:2000; IV Piggyback:50] Out: 500 [Urine:500] Intake/Output this shift: Total I/O In: 3735 [P.O.:360; I.V.:3325; IV Piggyback:50] Out: 400 [Urine:400]  Past Medical History  Diagnosis Date  . Hypertension   . GERD (gastroesophageal reflux disease)   . Glaucoma   . H/O renal calculi 2002 & 2006  . H/O hiatal hernia   . Headache(784.0)     migraine  . Pneumonia     dx 10-06-2014 per CXR--  on 10-27-2014 pt states finished antibiotic and denies cough or fever  . Hyperlipidemia   . History of MI (myocardial infarction)     10/ 2013 in setting of Septic Shock  . History of atrial fibrillation     10/ 2013  in setting of Septic Shock  . History of CHF (congestive heart failure)     10/ 2013 in setting of septic shock  . Sigmoid diverticulosis   . Nephrolithiasis     bilateral  . CKD (chronic kidney disease), stage III     nephrologist-  dr Florene Glen  . Right ureteral stone   . History of acute respiratory failure     10/ 2013  -- ventilated in setting septic shock  . Dysrhythmia     Afib in 2013 when she had septic shock related to stone ureteral obstruction  . Myocardial infarction     Was reported in 2013 during hospitalization with septic shock  . Arthritis     knee's    Physical Exam:  Lungs - Normal respiratory effort, chest expands symmetrically.   Abdomen - Soft, non-tender & non-distended.  Lab Results:  Recent Labs  11/12/14 2207 11/13/14 1125 11/14/14 0630  WBC 19.8* 11.9* 7.4  HGB 12.6 11.0* 10.4*  HCT 38.0 34.7* 32.0*   BMET  Recent Labs  11/12/14 2207 11/13/14 1125 11/14/14 0630  NA 132*  --  133*  K 4.6  --  4.6  CL 101  --  108  CO2 19*  --  16*  GLUCOSE 172*  --  85  BUN 53*  --  42*  CREATININE 4.88* 4.33* 3.30*  CALCIUM 9.2  --  8.0*   No results for input(s): LABURIN in the last 72 hours. Results for orders placed or performed during the hospital encounter of 11/12/14  Blood culture (routine x 2)     Status: None (Preliminary result)   Collection Time: 11/13/14 12:09 AM  Result Value Ref Range Status   Specimen Description BLOOD RIGHT ARM  Final   Special Requests BOTTLES DRAWN AEROBIC AND ANAEROBIC 5CC  Final   Culture   Final           BLOOD CULTURE RECEIVED NO GROWTH TO DATE CULTURE WILL BE HELD FOR 5 DAYS BEFORE ISSUING A FINAL NEGATIVE REPORT Performed at [facility]    Report Status PENDING  Incomplete  Blood culture (routine x  2)     Status: None (Preliminary result)   Collection Time: 11/13/14 12:16 AM  Result Value Ref Range Status   Specimen Description BLOOD LEFT ARM  Final   Special Requests BOTTLES DRAWN AEROBIC AND ANAEROBIC 5CC  Final   Culture   Final           BLOOD CULTURE RECEIVED NO GROWTH TO DATE CULTURE WILL BE HELD FOR 5 DAYS BEFORE ISSUING A FINAL NEGATIVE REPORT Performed at [facility]    Report Status PENDING  Incomplete  Urine culture     Status: None   Collection Time: 11/13/14  1:25 AM  Result Value Ref Range Status   Specimen Description URINE, CATHETERIZED  Final   Special Requests NONE  Final   Colony Count   Final    40,000 COLONIES/ML Performed at [facility]    Culture YEAST Performed at [facility]   Final   Report Status 11/14/2014 FINAL  Final    Studies/Results: No results found.  Assessment:CT from May  5th and 25th reviewed. U/s in hospital reviewed. Intra-operative retrograde pyelograms reviewed. She was said to have large ureteral stone and right lower pole stone , but now measurements are smaller. KUB does not show stone. ? Uric acid or protein stone. She is on [facility] OR schedule for cysto and flexible ureteroscopy next Friday.    She looks well tonight, and I would anticipate that she will be able to be discharged on antibiotics this weekend, and keep appointment for surgery next week.   Plan: Call weekend Urology coverage if needed.  Frantz Quattrone I Inaara Tye 11/14/2014, 6:36 PM

## 2014-11-14 NOTE — Progress Notes (Signed)
PROGRESS NOTE  [name] [id number] DOB: Dec 11, 1945 DOA: 11/12/2014 PCP: Amy Argyle, MD  Assessment/Plan: Sepsis secondary to acute pyelonephritis - Patient is febrile 103 in ED, tachycardic, hypotensive, tachypneic with leukocytosis, meet sepsis criteria on admission. - Patient with stent placement 10/28/14, stent functioning appropriately by Urology. - Start on cefepime, follow on urine cultures and blood cultures( history of Escherichia coli sepsis and bacteremia in 2013)  Acute on chronic renal failure. - Baseline creatinine is 1.8, today is 4.8, this is most likely in the setting of dehydration and volume depletion as no evidence of urinary retention, or worsening hydronephrosis on renal ultrasound. - cont IVF  Hyponatremia - Secondary to volume depletion, continue with IV normal saline.  Hypertension - Blood pressure is on the lower side, will hold antihypertensives medication  Glaucoma - Continue with home medication.  Paroxysmal A. Fib - Patient with known history of A. fib in the past, on aspirin, followed by cardiology [name]. - ChadsVasc2 score is 3, will leave chronic anticoagulation decision up to cardiology, she has a follow-up on 11/21/14 with [name].  Leukocytosis - resolved  Code Status: full Family Communication: patient Disposition Plan:    Consultants:    Procedures:      HPI/Subjective: Feeling some better, still with hot/cold falshes  Objective: Filed Vitals:   11/14/14 0520  BP: 97/58  Pulse: 88  Temp: 98.1 F (36.7 C)  Resp: 25    Intake/Output Summary (Last 24 hours) at 11/14/14 0924 Last data filed at 11/13/14 2200  Gross per 24 hour  Intake    120 ml  Output    500 ml  Net   -380 ml   Filed Weights   11/13/14 0035  Weight: 97.206 kg (214 lb 4.8 oz)    Exam:   General:  A+Ox3, NAD  Cardiovascular: rrr  Respiratory: clear  Abdomen: +Bs, soft  Musculoskeletal: no edema, SCDs on    Data Reviewed: Basic Metabolic Panel:  Recent Labs Lab 11/12/14 2207 11/13/14 1125 11/14/14 0630  NA 132*  --  133*  K 4.6  --  4.6  CL 101  --  108  CO2 19*  --  16*  GLUCOSE 172*  --  85  BUN 53*  --  42*  CREATININE 4.88* 4.33* 3.30*  CALCIUM 9.2  --  8.0*   Liver Function Tests:  Recent Labs Lab 11/12/14 2207  AST 49*  ALT 34  ALKPHOS 94  BILITOT 0.4  PROT 6.4*  ALBUMIN 3.1*    Recent Labs Lab 11/12/14 2207  LIPASE 16*   No results for input(s): AMMONIA in the last 168 hours. CBC:  Recent Labs Lab 11/12/14 2207 11/13/14 1125 11/14/14 0630  WBC 19.8* 11.9* 7.4  NEUTROABS 18.5*  --   --   HGB 12.6 11.0* 10.4*  HCT 38.0 34.7* 32.0*  MCV 87.6 87.6 88.2  PLT 250 180 187   Cardiac Enzymes: No results for input(s): CKTOTAL, CKMB, CKMBINDEX, TROPONINI in the last 168 hours. BNP (last 3 results) No results for input(s): BNP in the last 8760 hours.  ProBNP (last 3 results) No results for input(s): PROBNP in the last 8760 hours.  CBG: No results for input(s): GLUCAP in the last 168 hours.  Recent Results (from the past 240 hour(s))  Blood culture (routine x 2)     Status: None (Preliminary result)   Collection Time: 11/13/14 12:09 AM  Result Value Ref Range Status   Specimen Description BLOOD RIGHT ARM  Final  Special Requests BOTTLES DRAWN AEROBIC AND ANAEROBIC 5CC  Final   Culture   Final           BLOOD CULTURE RECEIVED NO GROWTH TO DATE CULTURE WILL BE HELD FOR 5 DAYS BEFORE ISSUING A FINAL NEGATIVE REPORT Performed at [facility]    Report Status PENDING  Incomplete  Blood culture (routine x 2)     Status: None (Preliminary result)   Collection Time: 11/13/14 12:16 AM  Result Value Ref Range Status   Specimen Description BLOOD LEFT ARM  Final   Special Requests BOTTLES DRAWN AEROBIC AND ANAEROBIC 5CC  Final   Culture   Final           BLOOD CULTURE RECEIVED NO GROWTH TO DATE CULTURE WILL BE HELD FOR 5 DAYS BEFORE ISSUING A  FINAL NEGATIVE REPORT Performed at [facility]    Report Status PENDING  Incomplete     Studies: Dg Abd 1 View  11/13/2014   CLINICAL DATA:  Flank pain with blood in urine.  EXAM: ABDOMEN - 1 VIEW  COMPARISON:  11/10/2014 abdominal CT  FINDINGS: A right ureteral stent has similar positioning to CT 11/10/2014. Known right nephrolithiasis is difficult to visualize due to faint density. Punctate densities over the left kidney move and are likely within small bowel. No evidence of ureteral calculus.  IMPRESSION: 1. Right internal ureteral stent, positioning unremarkable and stable from CT 3 days ago. 2. Bilateral nephrolithiasis is difficult to visualize radiographically.   Electronically Signed   By: [name] M.D.   On: 11/13/2014 06:48   US Renal  11/13/2014   CLINICAL DATA:  Flank pain.  EXAM: RENAL / URINARY TRACT ULTRASOUND COMPLETE  COMPARISON:  Abdominal CT 11/10/2014  FINDINGS: Right Kidney:  Length: 12 cm. Echogenic appearing cortex with cortical thinning to 1 cm. There is mild hydronephrosis, similar to prior. A previous seen right ureteral stent is subtly visualized in the upper right ureter.  Left Kidney:  Length: 12 cm. Mild hydronephrosis, stable from prior. Borderline echogenic cortex.  Bladder:  Completely decompressed.  IMPRESSION: 1. Mild bilateral hydronephrosis, stable from 11/10/2014. 2. Decompressed urinary bladder.   Electronically Signed   By: [name] M.D.   On: 11/13/2014 03:53    Scheduled Meds: . aspirin EC  81 mg Oral q morning - 10a  . ceFEPime (MAXIPIME) IV  1 g Intravenous Q24H  . heparin  5,000 Units Subcutaneous 3 times per day  . latanoprost  1 drop Both Eyes QHS  . multivitamin with minerals  1 tablet Oral Daily  . pantoprazole  40 mg Oral Daily  . timolol  1 drop Both Eyes Daily   Continuous Infusions: . sodium chloride 100 mL/hr at 11/13/14 2325   Antibiotics Given (last 72 hours)    None      Active Problems:   AKI (acute  kidney injury)   Glaucoma   Essential hypertension, benign   Atrial fibrillation   Acute pyelonephritis    Time spent: 25 min    [name]  [facility] Pager [phone]. If 7PM-7AM, please contact night-coverage at [email or web address], password [facility] 11/14/2014, 9:24 AM  LOS: 1 day

## 2014-11-14 NOTE — Progress Notes (Signed)
PT Cancellation Note  Patient Details Name: [name] MRN: [id number] DOB: 11/14/1945   Cancelled Treatment:    Reason Eval/Treat Not Completed: Patient declined, no reason specified Pt reports she has just received her breakfast and requests PT return at a later time. Will follow up shortly, as schedule allows.  Ellouise Newer 11/14/2014, [place], [place]

## 2014-11-15 DIAGNOSIS — I10 Essential (primary) hypertension: Secondary | ICD-10-CM

## 2014-11-15 LAB — BASIC METABOLIC PANEL
Anion gap: 5 (ref 5–15)
BUN: 35 mg/dL — ABNORMAL HIGH (ref 6–20)
CO2: 20 mmol/L — ABNORMAL LOW (ref 22–32)
Calcium: 8 mg/dL — ABNORMAL LOW (ref 8.9–10.3)
Chloride: 110 mmol/L (ref 101–111)
Creatinine, Ser: 2.75 mg/dL — ABNORMAL HIGH (ref 0.44–1.00)
GFR calc Af Amer: 19 mL/min — ABNORMAL LOW (ref >60)
GFR calc non Af Amer: 17 mL/min — ABNORMAL LOW (ref >60)
Glucose, Bld: 106 mg/dL — ABNORMAL HIGH (ref 65–99)
Potassium: 4.3 mmol/L (ref 3.5–5.1)
Sodium: 135 mmol/L (ref 135–145)

## 2014-11-15 LAB — CBC
HCT: 30.2 % — ABNORMAL LOW (ref 36.0–46.0)
Hemoglobin: 10 g/dL — ABNORMAL LOW (ref 12.0–15.0)
MCH: 28.5 pg (ref 26.0–34.0)
MCHC: 33.1 g/dL (ref 30.0–36.0)
MCV: 86 fL (ref 78.0–100.0)
Platelets: 182 10*3/uL (ref 150–400)
RBC: 3.51 MIL/uL — ABNORMAL LOW (ref 3.87–5.11)
RDW: 15.9 % — ABNORMAL HIGH (ref 11.5–15.5)
WBC: 5.4 10*3/uL (ref 4.0–10.5)

## 2014-11-15 MED ORDER — FLUCONAZOLE 100 MG PO TABS
100.0000 mg | ORAL_TABLET | Freq: Every day | ORAL | Status: DC
  Administered 2014-11-15 – 2014-11-17 (×3): 100 mg via ORAL
  Filled 2014-11-15 (×3): qty 1

## 2014-11-15 NOTE — Progress Notes (Signed)
PROGRESS NOTE  [name] [id number] DOB: [birth year] DOA: 11/12/2014 PCP: [name], MD  Assessment/Plan: Sepsis secondary to acute pyelonephritis - Patient is febrile 103 in ED, tachycardic, hypotensive, tachypneic with leukocytosis, meet sepsis criteria on admission. - Patient with stent placement 10/28/14, stent functioning appropriately by Urology. - Start on cefepime, follow on urine cultures and blood cultures (history of Escherichia coli sepsis and bacteremia in 2013) -so far urine only growing yeast- add diflucan -has outpatient scope on Friday by Dr. Gaynelle Arabian  Acute on chronic renal failure. - Baseline creatinine is 1.8, today is 4.8, this is most likely in the setting of dehydration and volume depletion as no evidence of urinary retention, or worsening hydronephrosis on renal ultrasound. - cont IVF  Hyponatremia -resolved  Hypertension - Blood pressure is on the lower side, will hold antihypertensives medication  Glaucoma - Continue with home medication.  Paroxysmal A. Fib - Patient with known history of A. fib in the past, on aspirin, followed by cardiology Dr. Scarlette Calico. - ChadsVasc2 score is 3, will leave chronic anticoagulation decision up to cardiology, she has a follow-up on 11/21/14 with Dr. Scarlette Calico.  Leukocytosis - resolved  Code Status: full Family Communication: patient Disposition Plan:    Consultants:    Procedures:      HPI/Subjective: Continues to improve No SOB, no CP  Objective: Filed Vitals:   11/15/14 0500  BP: 118/54  Pulse: 78  Temp: 98.2 F (36.8 C)  Resp: 19    Intake/Output Summary (Last 24 hours) at 11/15/14 0820 Last data filed at 11/15/14 0549  Gross per 24 hour  Intake 4416.67 ml  Output   2000 ml  Net 2416.67 ml   Filed Weights   11/13/14 0035  Weight: 97.206 kg (214 lb 4.8 oz)    Exam:   General:  A+Ox3, NAD  Cardiovascular: rrr  Respiratory: clear  Abdomen: +Bs,  soft  Musculoskeletal: no edema, SCDs on   Data Reviewed: Basic Metabolic Panel:  Recent Labs Lab 11/12/14 2207 11/13/14 1125 11/14/14 0630 11/15/14 0443  NA 132*  --  133* 135  K 4.6  --  4.6 4.3  CL 101  --  108 110  CO2 19*  --  16* 20*  GLUCOSE 172*  --  85 106*  BUN 53*  --  42* 35*  CREATININE 4.88* 4.33* 3.30* 2.75*  CALCIUM 9.2  --  8.0* 8.0*   Liver Function Tests:  Recent Labs Lab 11/12/14 2207  AST 49*  ALT 34  ALKPHOS 94  BILITOT 0.4  PROT 6.4*  ALBUMIN 3.1*    Recent Labs Lab 11/12/14 2207  LIPASE 16*   No results for input(s): AMMONIA in the last 168 hours. CBC:  Recent Labs Lab 11/12/14 2207 11/13/14 1125 11/14/14 0630 11/15/14 0443  WBC 19.8* 11.9* 7.4 5.4  NEUTROABS 18.5*  --   --   --   HGB 12.6 11.0* 10.4* 10.0*  HCT 38.0 34.7* 32.0* 30.2*  MCV 87.6 87.6 88.2 86.0  PLT 250 180 187 182   Cardiac Enzymes: No results for input(s): CKTOTAL, CKMB, CKMBINDEX, TROPONINI in the last 168 hours. BNP (last 3 results) No results for input(s): BNP in the last 8760 hours.  ProBNP (last 3 results) No results for input(s): PROBNP in the last 8760 hours.  CBG: No results for input(s): GLUCAP in the last 168 hours.  Recent Results (from the past 240 hour(s))  Blood culture (routine x 2)     Status: None (Preliminary result)  Collection Time: 11/13/14 12:09 AM  Result Value Ref Range Status   Specimen Description BLOOD RIGHT ARM  Final   Special Requests BOTTLES DRAWN AEROBIC AND ANAEROBIC 5CC  Final   Culture   Final           BLOOD CULTURE RECEIVED NO GROWTH TO DATE CULTURE WILL BE HELD FOR 5 DAYS BEFORE ISSUING A FINAL NEGATIVE REPORT Performed at [facility]    Report Status PENDING  Incomplete  Blood culture (routine x 2)     Status: None (Preliminary result)   Collection Time: 11/13/14 12:16 AM  Result Value Ref Range Status   Specimen Description BLOOD LEFT ARM  Final   Special Requests BOTTLES DRAWN AEROBIC AND  ANAEROBIC 5CC  Final   Culture   Final           BLOOD CULTURE RECEIVED NO GROWTH TO DATE CULTURE WILL BE HELD FOR 5 DAYS BEFORE ISSUING A FINAL NEGATIVE REPORT Performed at [facility]    Report Status PENDING  Incomplete  Urine culture     Status: None   Collection Time: 11/13/14  1:25 AM  Result Value Ref Range Status   Specimen Description URINE, CATHETERIZED  Final   Special Requests NONE  Final   Colony Count   Final    40,000 COLONIES/ML Performed at [facility]    Culture YEAST Performed at [facility]   Final   Report Status 11/14/2014 FINAL  Final     Studies: No results found.  Scheduled Meds: . aspirin EC  81 mg Oral q morning - 10a  . ceFEPime (MAXIPIME) IV  1 g Intravenous Q24H  . fluconazole  100 mg Oral Daily  . heparin  5,000 Units Subcutaneous 3 times per day  . latanoprost  1 drop Both Eyes QHS  . multivitamin with minerals  1 tablet Oral Daily  . pantoprazole  40 mg Oral Daily  . timolol  1 drop Both Eyes Daily   Continuous Infusions: . sodium chloride 100 mL/hr at 11/15/14 0541   Antibiotics Given (last 72 hours)    None      Active Problems:   AKI (acute kidney injury)   Glaucoma   Essential hypertension, benign   Atrial fibrillation   Acute pyelonephritis    Time spent: 25 min    Eliseo Squires Otis Burress  [facility] Pager [phone]. If 7PM-7AM, please contact night-coverage at [email or web address], password [facility] 11/15/2014, 8:20 AM  LOS: 2 days

## 2014-11-15 NOTE — Progress Notes (Signed)
[facility] notified that pt foley is leaking and this has been an ongoing problem since first shift and that sediment is found in urine. New orders to replace foley. Arthor Captain LPN

## 2014-11-16 LAB — CBC
HCT: 30.9 % — ABNORMAL LOW (ref 36.0–46.0)
Hemoglobin: 10 g/dL — ABNORMAL LOW (ref 12.0–15.0)
MCH: 28 pg (ref 26.0–34.0)
MCHC: 32.4 g/dL (ref 30.0–36.0)
MCV: 86.6 fL (ref 78.0–100.0)
Platelets: 208 10*3/uL (ref 150–400)
RBC: 3.57 MIL/uL — ABNORMAL LOW (ref 3.87–5.11)
RDW: 16.1 % — ABNORMAL HIGH (ref 11.5–15.5)
WBC: 8 10*3/uL (ref 4.0–10.5)

## 2014-11-16 LAB — BASIC METABOLIC PANEL
Anion gap: 6 (ref 5–15)
BUN: 31 mg/dL — ABNORMAL HIGH (ref 6–20)
CO2: 20 mmol/L — ABNORMAL LOW (ref 22–32)
Calcium: 8 mg/dL — ABNORMAL LOW (ref 8.9–10.3)
Chloride: 107 mmol/L (ref 101–111)
Creatinine, Ser: 2.87 mg/dL — ABNORMAL HIGH (ref 0.44–1.00)
GFR calc Af Amer: 18 mL/min — ABNORMAL LOW (ref >60)
GFR calc non Af Amer: 16 mL/min — ABNORMAL LOW (ref >60)
Glucose, Bld: 103 mg/dL — ABNORMAL HIGH (ref 65–99)
Potassium: 4.2 mmol/L (ref 3.5–5.1)
Sodium: 133 mmol/L — ABNORMAL LOW (ref 135–145)

## 2014-11-16 MED ORDER — LACTULOSE 10 GM/15ML PO SOLN
10.0000 g | Freq: Two times a day (BID) | ORAL | Status: DC | PRN
  Administered 2014-11-16: 10 g via ORAL
  Filled 2014-11-16 (×2): qty 15

## 2014-11-16 NOTE — Progress Notes (Signed)
PROGRESS NOTE  [name] [id number] DOB: 08/19/1945 DOA: 11/12/2014 PCP: [name], MD  Assessment/Plan: Sepsis secondary to acute pyelonephritis - Patient is febrile 103 in ED, tachycardic, hypotensive, tachypneic with leukocytosis, meet sepsis criteria on admission. - Patient with stent placement 10/28/14, stent functioning appropriately by Urology. - Start on cefepime, follow on urine cultures and blood cultures (history of Escherichia coli sepsis and bacteremia in 2013) -so far urine only growing yeast- add diflucan -has outpatient scope on Friday by Dr. Gaynelle Arabian - will probably need to d/c with foley Acute on chronic renal failure. - Baseline creatinine is 1.8, today is 4.8, this is most likely in the setting of dehydration and volume depletion as no evidence of urinary retention, or worsening hydronephrosis on renal ultrasound. - cont IVF  Hyponatremia -resolved  Hypertension - Blood pressure is on the lower side, will hold antihypertensives medication  Glaucoma - Continue with home medication.  Paroxysmal A. Fib - Patient with known history of A. fib in the past, on aspirin, followed by cardiology Dr. Scarlette Calico. - ChadsVasc2 score is 3, will leave chronic anticoagulation decision up to cardiology, she has a follow-up on 11/21/14 with Dr. Scarlette Calico.  Leukocytosis - resolved  Code Status: full Family Communication: patient Disposition Plan:    Consultants:    Procedures:      HPI/Subjective: Not eating well  Objective: Filed Vitals:   11/16/14 0552  BP: 117/59  Pulse: 82  Temp: 98.3 F (36.8 C)  Resp: 18    Intake/Output Summary (Last 24 hours) at 11/16/14 1110 Last data filed at 11/16/14 0913  Gross per 24 hour  Intake    640 ml  Output   1400 ml  Net   -760 ml   Filed Weights   11/13/14 0035  Weight: 97.206 kg (214 lb 4.8 oz)    Exam:   General:  A+Ox3, NAD  Cardiovascular: rrr  Respiratory: clear  Abdomen:  +Bs, soft  Musculoskeletal: no edema, SCDs on   Data Reviewed: Basic Metabolic Panel:  Recent Labs Lab 11/12/14 2207 11/13/14 1125 11/14/14 0630 11/15/14 0443 11/16/14 0620  NA 132*  --  133* 135 133*  K 4.6  --  4.6 4.3 4.2  CL 101  --  108 110 107  CO2 19*  --  16* 20* 20*  GLUCOSE 172*  --  85 106* 103*  BUN 53*  --  42* 35* 31*  CREATININE 4.88* 4.33* 3.30* 2.75* 2.87*  CALCIUM 9.2  --  8.0* 8.0* 8.0*   Liver Function Tests:  Recent Labs Lab 11/12/14 2207  AST 49*  ALT 34  ALKPHOS 94  BILITOT 0.4  PROT 6.4*  ALBUMIN 3.1*    Recent Labs Lab 11/12/14 2207  LIPASE 16*   No results for input(s): AMMONIA in the last 168 hours. CBC:  Recent Labs Lab 11/12/14 2207 11/13/14 1125 11/14/14 0630 11/15/14 0443 11/16/14 0620  WBC 19.8* 11.9* 7.4 5.4 8.0  NEUTROABS 18.5*  --   --   --   --   HGB 12.6 11.0* 10.4* 10.0* 10.0*  HCT 38.0 34.7* 32.0* 30.2* 30.9*  MCV 87.6 87.6 88.2 86.0 86.6  PLT 250 180 187 182 208   Cardiac Enzymes: No results for input(s): CKTOTAL, CKMB, CKMBINDEX, TROPONINI in the last 168 hours. BNP (last 3 results) No results for input(s): BNP in the last 8760 hours.  ProBNP (last 3 results) No results for input(s): PROBNP in the last 8760 hours.  CBG: No results for input(s): GLUCAP  in the last 168 hours.  Recent Results (from the past 240 hour(s))  Blood culture (routine x 2)     Status: None (Preliminary result)   Collection Time: 11/13/14 12:09 AM  Result Value Ref Range Status   Specimen Description BLOOD RIGHT ARM  Final   Special Requests BOTTLES DRAWN AEROBIC AND ANAEROBIC 5CC  Final   Culture   Final           BLOOD CULTURE RECEIVED NO GROWTH TO DATE CULTURE WILL BE HELD FOR 5 DAYS BEFORE ISSUING A FINAL NEGATIVE REPORT Performed at [facility]    Report Status PENDING  Incomplete  Blood culture (routine x 2)     Status: None (Preliminary result)   Collection Time: 11/13/14 12:16 AM  Result Value Ref Range  Status   Specimen Description BLOOD LEFT ARM  Final   Special Requests BOTTLES DRAWN AEROBIC AND ANAEROBIC 5CC  Final   Culture   Final           BLOOD CULTURE RECEIVED NO GROWTH TO DATE CULTURE WILL BE HELD FOR 5 DAYS BEFORE ISSUING A FINAL NEGATIVE REPORT Performed at [facility]    Report Status PENDING  Incomplete  Urine culture     Status: None   Collection Time: 11/13/14  1:25 AM  Result Value Ref Range Status   Specimen Description URINE, CATHETERIZED  Final   Special Requests NONE  Final   Colony Count   Final    40,000 COLONIES/ML Performed at [facility]    Culture YEAST Performed at [facility]   Final   Report Status 11/14/2014 FINAL  Final     Studies: No results found.  Scheduled Meds: . ceFEPime (MAXIPIME) IV  1 g Intravenous Q24H  . fluconazole  100 mg Oral Daily  . heparin  5,000 Units Subcutaneous 3 times per day  . latanoprost  1 drop Both Eyes QHS  . multivitamin with minerals  1 tablet Oral Daily  . pantoprazole  40 mg Oral Daily  . timolol  1 drop Both Eyes Daily   Continuous Infusions: . sodium chloride 100 mL/hr at 11/16/14 0913   Antibiotics Given (last 72 hours)    None      Active Problems:   AKI (acute kidney injury)   Glaucoma   Essential hypertension, benign   Atrial fibrillation   Acute pyelonephritis    Time spent: 25 min    [name]  [facility] Pager [phone]. If 7PM-7AM, please contact night-coverage at [email or web address], password [facility] 11/16/2014, 11:10 AM  LOS: 3 days

## 2014-11-16 NOTE — Plan of Care (Signed)
Problem: Phase I Progression Outcomes Goal: Voiding-avoid urinary catheter unless indicated Outcome: Adequate for Discharge Patient will discharge with foley cath

## 2014-11-17 LAB — BASIC METABOLIC PANEL
Anion gap: 8 (ref 5–15)
BUN: 29 mg/dL — ABNORMAL HIGH (ref 6–20)
CO2: 18 mmol/L — ABNORMAL LOW (ref 22–32)
Calcium: 7.9 mg/dL — ABNORMAL LOW (ref 8.9–10.3)
Chloride: 108 mmol/L (ref 101–111)
Creatinine, Ser: 2.8 mg/dL — ABNORMAL HIGH (ref 0.44–1.00)
GFR calc Af Amer: 19 mL/min — ABNORMAL LOW (ref >60)
GFR calc non Af Amer: 16 mL/min — ABNORMAL LOW (ref >60)
Glucose, Bld: 106 mg/dL — ABNORMAL HIGH (ref 65–99)
Potassium: 4.2 mmol/L (ref 3.5–5.1)
Sodium: 134 mmol/L — ABNORMAL LOW (ref 135–145)

## 2014-11-17 LAB — CBC
HCT: 28.9 % — ABNORMAL LOW (ref 36.0–46.0)
Hemoglobin: 9.5 g/dL — ABNORMAL LOW (ref 12.0–15.0)
MCH: 28.1 pg (ref 26.0–34.0)
MCHC: 32.9 g/dL (ref 30.0–36.0)
MCV: 85.5 fL (ref 78.0–100.0)
Platelets: 209 10*3/uL (ref 150–400)
RBC: 3.38 MIL/uL — ABNORMAL LOW (ref 3.87–5.11)
RDW: 16.2 % — ABNORMAL HIGH (ref 11.5–15.5)
WBC: 8.9 10*3/uL (ref 4.0–10.5)

## 2014-11-17 MED ORDER — OXYCODONE-ACETAMINOPHEN 5-325 MG PO TABS: 1.0000 | ORAL_TABLET | Freq: Four times a day (QID) | ORAL | Status: DC | PRN

## 2014-11-17 MED ORDER — FLUCONAZOLE 100 MG PO TABS: 100.0000 mg | ORAL_TABLET | Freq: Every day | ORAL | Status: DC

## 2014-11-17 MED ORDER — CEFUROXIME AXETIL 500 MG PO TABS: 500.0000 mg | ORAL_TABLET | Freq: Two times a day (BID) | ORAL | Status: DC

## 2014-11-17 NOTE — Progress Notes (Signed)
NURSING PROGRESS NOTE  [facility] [place] [id number] Discharge Data: 11/17/2014 1:15 PM Attending Provider: Geradine Girt, DO [id number] THOMAS, MD     [name] to be D/C'd Home per MD order.  Discussed with the patient the After Visit Summary and all questions fully answered. All IV's discontinued with no bleeding noted. All belongings returned to patient for patient to take home. Patient was shown and educated on how to properly care for her foley bag. She was also given a leg bag for easy transport. Both patient and her daughter verbalized having good understanding. Awaiting volunteer transport for d/c home. All prescription given to patient.   Last Vital Signs:  Blood pressure 117/54, pulse 95, temperature 98.4 F (36.9 C), temperature source Oral, resp. rate 18, height 5\' 3"  (1.6 m), weight 97.206 kg (214 lb 4.8 oz), last menstrual period 01/19/1992, SpO2 97 %.  Discharge Medication List   Medication List    STOP taking these medications        amLODipine 5 MG tablet  Commonly known as:  NORVASC     aspirin EC 81 MG tablet     metoprolol tartrate 25 MG tablet  Commonly known as:  LOPRESSOR     sulfamethoxazole-trimethoprim 800-160 MG per tablet  Commonly known as:  BACTRIM DS,SEPTRA DS     zinc gluconate 50 MG tablet      TAKE these medications        bimatoprost 0.01 % Soln  Commonly known as:  LUMIGAN  Place 1 drop into both eyes at bedtime.     cefUROXime 500 MG tablet  Commonly known as:  CEFTIN  Take 1 tablet (500 mg total) by mouth 2 (two) times daily with a meal.     fluconazole 100 MG tablet  Commonly known as:  DIFLUCAN  Take 1 tablet (100 mg total) by mouth daily.     fluticasone 50 MCG/ACT nasal spray  Commonly known as:  FLONASE  Place 2 sprays into both nostrils daily as needed for allergies or rhinitis.     multivitamin with minerals Tabs tablet  Take 1 tablet by mouth daily.     oxyCODONE-acetaminophen 5-325 MG per tablet  Commonly  known as:  PERCOCET  Take 1-2 tablets by mouth every 6 (six) hours as needed for severe pain.     ranitidine 150 MG tablet  Commonly known as:  ZANTAC  Take 150 mg by mouth 2 (two) times daily.     timolol 0.5 % ophthalmic solution  Commonly known as:  BETIMOL  Place 1 drop into both eyes daily.

## 2014-11-17 NOTE — Plan of Care (Signed)
Problem: Phase II Progression Outcomes Goal: Obtain order to discontinue catheter if appropriate Outcome: Not Applicable Date Met:  31/74/09  discharge home with foley.

## 2014-11-17 NOTE — Discharge Summary (Signed)
Physician Discharge Summary  [name] [id number] DOB: 1945/10/25 DOA: 11/12/2014  PCP: [name], MD  Admit date: 11/12/2014 Discharge date: 11/17/2014  Time spent: 35 minutes  Recommendations for Outpatient Follow-up:  1. Follow up with urology on Friday 2. D/c with foley  Discharge Diagnoses:  Active Problems:   AKI (acute kidney injury)   Glaucoma   Essential hypertension, benign   Atrial fibrillation   Acute pyelonephritis   Discharge Condition: improved  Diet recommendation:   Filed Weights   11/13/14 0035  Weight: 97.206 kg (214 lb 4.8 oz)    History of present illness:  [name] is a 69 y.o. female, past medical history of hypertension, glaucoma, atrial fibrillation, hyperlipidemia, GERD, and history of recurrent renal stones in the past requiring lithotripsy and renal stents placements, showed primarily by Dr. Gaynelle Arabian, patient presents with complaints of right flank pain, fever, chills, nausea and vomiting, patient had recent right ureteral stent put by Dr. Gaynelle Arabian on 5/10 for right kidney stones with hydronephrosis, treated initially with Cipro, then transitioned to Bactrim,( in 2013 patient had an episode of bacteremia, and pyelonephritis secondary to Escherichia coli), NAD patient was noticed to be in acute on chronic renal failure with baseline creatinine is 1.8, creatinine today was 4.8, renal ultrasound obtained, showing stable bilateral hydronephrosis, urinalysis was significant for UTI, evaluated the patient in ED, and requested admission to hospitalist service for acute renal failure, and acute pyelonephritis, urology evaluation obtained in ED, they think stent is functioning appropriately.   Hospital Course:  Sepsis secondary to acute pyelonephritis - Patient is febrile 103 in ED, tachycardic, hypotensive, tachypneic with leukocytosis, meet sepsis criteria on admission. - Patient with stent placement 10/28/14, stent functioning  appropriately by Urology. - Start on cefepime, follow on urine cultures and blood cultures (history of Escherichia coli sepsis and bacteremia in 2013) -so far urine only growing yeast- add diflucan- will d/c on ceftin as patient was partially treated -has outpatient scope on Friday by Dr. Gaynelle Arabian - will probably need to d/c with foley  Acute on chronic renal failure. - Baseline creatinine is 1.8, today is 4.8, this is most likely in the setting of dehydration and volume depletion as no evidence of urinary retention, or worsening hydronephrosis on renal ultrasound. - cont IVF  Hyponatremia -resolved  Hypertension - Blood pressure is on the lower side, will hold antihypertensives medication  Glaucoma - Continue with home medication.  Paroxysmal A. Fib - Patient with known history of A. fib in the past, on aspirin, followed by cardiology Dr. Scarlette Calico. - ChadsVasc2 score is 3, will leave chronic anticoagulation decision up to cardiology, she has a follow-up on 11/21/14 with Dr. Scarlette Calico. -holding ASA for procedure  Leukocytosis - resolved  Procedures:    Consultations:  urology  Discharge Exam: Filed Vitals:   11/17/14 0453  BP: 117/54  Pulse: 95  Temp: 98.4 F (36.9 C)  Resp: 18    General: A+Ox3, NAd Cardiovascular: rrr Respiratory: clear  Discharge Instructions   Discharge Instructions    Diet - low sodium heart healthy    Complete by:  As directed      Discharge instructions    Complete by:  As directed   BMP Friday D/c with foley- keep appointment with urology on Friday for removal Drink plenty of fluids     Increase activity slowly    Complete by:  As directed           Current Discharge Medication List    START  taking these medications   Details  cefUROXime (CEFTIN) 500 MG tablet Take 1 tablet (500 mg total) by mouth 2 (two) times daily with a meal. Qty: 10 tablet, Refills: 0    fluconazole (DIFLUCAN) 100 MG tablet Take 1 tablet (100 mg  total) by mouth daily. Qty: 7 tablet, Refills: 0      CONTINUE these medications which have CHANGED   Details  oxyCODONE-acetaminophen (PERCOCET) 5-325 MG per tablet Take 1-2 tablets by mouth every 6 (six) hours as needed for severe pain. Qty: 10 tablet, Refills: 0      CONTINUE these medications which have NOT CHANGED   Details  bimatoprost (LUMIGAN) 0.01 % SOLN Place 1 drop into both eyes at bedtime.    fluticasone (FLONASE) 50 MCG/ACT nasal spray Place 2 sprays into both nostrils daily as needed for allergies or rhinitis.    ranitidine (ZANTAC) 150 MG tablet Take 150 mg by mouth 2 (two) times daily.    timolol (BETIMOL) 0.5 % ophthalmic solution Place 1 drop into both eyes daily.    Multiple Vitamin (MULTIVITAMIN WITH MINERALS) TABS Take 1 tablet by mouth daily.      STOP taking these medications     amLODipine (NORVASC) 5 MG tablet      aspirin EC 81 MG tablet      metoprolol tartrate (LOPRESSOR) 25 MG tablet      sulfamethoxazole-trimethoprim (BACTRIM DS,SEPTRA DS) 800-160 MG per tablet      zinc gluconate 50 MG tablet        Allergies  Allergen Reactions  . Vicodin [Hydrocodone-Acetaminophen] Nausea And Vomiting      The results of significant diagnostics from this hospitalization (including imaging, microbiology, ancillary and laboratory) are listed below for reference.    Significant Diagnostic Studies: Dg Chest 2 View  10/30/2014   CLINICAL DATA:  History of aspiration pneumonia, followup  EXAM: CHEST  2 VIEW  COMPARISON:  Chest x-ray of 10/06/2014 and images through the lung bases on CT abdomen of 10/26/2004  FINDINGS: No pneumonia is seen. There has been improvement in peribronchial thickening. Mediastinal and hilar contours are unchanged. A moderate size hiatal hernia remains. Heart size is stable. No acute bony abnormality is seen.  IMPRESSION: No active infiltrate or effusion. No change in moderate size hiatal hernia.   Electronically Signed   By: Ivar Drape M.D.   On: 10/30/2014 08:38   Dg Abd 1 View  11/13/2014   CLINICAL DATA:  Flank pain with blood in urine.  EXAM: ABDOMEN - 1 VIEW  COMPARISON:  11/10/2014 abdominal CT  FINDINGS: A right ureteral stent has similar positioning to CT 11/10/2014. Known right nephrolithiasis is difficult to visualize due to faint density. Punctate densities over the left kidney move and are likely within small bowel. No evidence of ureteral calculus.  IMPRESSION: 1. Right internal ureteral stent, positioning unremarkable and stable from CT 3 days ago. 2. Bilateral nephrolithiasis is difficult to visualize radiographically.   Electronically Signed   By: Monte Fantasia M.D.   On: 11/13/2014 06:48   US Renal  11/13/2014   CLINICAL DATA:  Flank pain.  EXAM: RENAL / URINARY TRACT ULTRASOUND COMPLETE  COMPARISON:  Abdominal CT 11/10/2014  FINDINGS: Right Kidney:  Length: 12 cm. Echogenic appearing cortex with cortical thinning to 1 cm. There is mild hydronephrosis, similar to prior. A previous seen right ureteral stent is subtly visualized in the upper right ureter.  Left Kidney:  Length: 12 cm. Mild hydronephrosis, stable from prior. Borderline  echogenic cortex.  Bladder:  Completely decompressed.  IMPRESSION: 1. Mild bilateral hydronephrosis, stable from 11/10/2014. 2. Decompressed urinary bladder.   Electronically Signed   By: Monte Fantasia M.D.   On: 11/13/2014 03:53   Ct Renal Stone Study  10/27/2014   CLINICAL DATA:  Right flank pain, blood in urine  EXAM: CT ABDOMEN AND PELVIS WITHOUT CONTRAST  TECHNIQUE: Multidetector CT imaging of the abdomen and pelvis was performed following the standard protocol without IV contrast.  COMPARISON:  11/29/2013  FINDINGS: The lung bases are unremarkable. Stable moderate size hiatal hernia. The patient is status postcholecystectomy. Unenhanced liver shows no biliary ductal dilatation. Unenhanced pancreas, spleen and adrenal glands are unremarkable. There is mild fullness of the left  renal collecting system and left ureter without frank hydronephrosis. At least 2 nonobstructive punctate calcified calculi are noted lower pole of the left kidney the largest measures 2 mm. No left ureteral calculi are noted. Minimal left perinephric stranding. There is cortical thinning left kidney. There is mild right hydronephrosis and proximal right hydroureter. Nonobstructive calcified calculus in lower pole of the right kidney measures 1.5 mm. There is nonobstructive calculus in right renal pelvis measures 8.3 mm. Axial image 44 there is calcified obstructive calculus in proximal right ureter at the level of upper endplate of L4 vertebral body measures 4.2 mm. Bilateral distal ureter is unremarkable.  No small bowel obstruction. No ascites or free air. No adenopathy. Normal retrocecal appendix. No pericecal inflammation. Scattered diverticula are noted descending colon. Multiple sigmoid colon diverticula. There is no evidence of acute diverticulitis. There is under distended urinary bladder. Thickening of urinary bladder wall. Cystitis cannot be excluded. Again noted significant degenerative changes lumbar spine at L1-L2 and L2-L3 level. Slight retrolisthesis L2 on L3 vertebral body again noted.  There is a midline right paraumbilical hernia containing fat measures 2.7 cm without evidence of acute complication. There is a midline anterior pelvic wall ventral hernia measures 7.6 x 4.3 cm containing fat without evidence of acute complication.  IMPRESSION: 1. There is mild right hydronephrosis and proximal right hydroureter. Nonobstructive calculus in right renal pelvis measures 8.3 mm. There is obstructive calculus in proximal right ureter measures 4.2 mm at the level of upper endplate of L4 vertebral body. 2. Left nonobstructive nephrolithiasis. There is mild fullness of the left renal collecting system and left ureter without frank hydronephrosis. No left ureteral calculi are noted. Mild left perinephric  stranding. There is again noted cortical thinning left kidney. 3. There is right paraumbilical ventral hernia containing fat without evidence of acute complication. Midline anterior pelvic wall ventral hernia containing fat without evidence of acute complication. 4. Stable moderate size hiatal hernia. 5. There is thickening of urinary bladder wall. Cystitis cannot be excluded. Urinary bladder is under distended. 6. Scattered diverticula are noted descending colon. Multiple sigmoid colon diverticula. There is no evidence of acute diverticulitis. 7. Normal appendix.  No pericecal inflammation.   Electronically Signed   By: Lahoma Crocker M.D.   On: 10/27/2014 08:14    Microbiology: Recent Results (from the past 240 hour(s))  Blood culture (routine x 2)     Status: None (Preliminary result)   Collection Time: 11/13/14 12:09 AM  Result Value Ref Range Status   Specimen Description BLOOD RIGHT ARM  Final   Special Requests BOTTLES DRAWN AEROBIC AND ANAEROBIC 5CC  Final   Culture   Final           BLOOD CULTURE RECEIVED NO GROWTH TO DATE CULTURE WILL  BE HELD FOR 5 DAYS BEFORE ISSUING A FINAL NEGATIVE REPORT Performed at [facility]    Report Status PENDING  Incomplete  Blood culture (routine x 2)     Status: None (Preliminary result)   Collection Time: 11/13/14 12:16 AM  Result Value Ref Range Status   Specimen Description BLOOD LEFT ARM  Final   Special Requests BOTTLES DRAWN AEROBIC AND ANAEROBIC 5CC  Final   Culture   Final           BLOOD CULTURE RECEIVED NO GROWTH TO DATE CULTURE WILL BE HELD FOR 5 DAYS BEFORE ISSUING A FINAL NEGATIVE REPORT Performed at [facility]    Report Status PENDING  Incomplete  Urine culture     Status: None   Collection Time: 11/13/14  1:25 AM  Result Value Ref Range Status   Specimen Description URINE, CATHETERIZED  Final   Special Requests NONE  Final   Colony Count   Final    40,000 COLONIES/ML Performed at [facility]    Culture  YEAST Performed at [facility]   Final   Report Status 11/14/2014 FINAL  Final     Labs: Basic Metabolic Panel:  Recent Labs Lab 11/12/14 2207 11/13/14 1125 11/14/14 0630 11/15/14 0443 11/16/14 0620 11/17/14 0019  NA 132*  --  133* 135 133* 134*  K 4.6  --  4.6 4.3 4.2 4.2  CL 101  --  108 110 107 108  CO2 19*  --  16* 20* 20* 18*  GLUCOSE 172*  --  85 106* 103* 106*  BUN 53*  --  42* 35* 31* 29*  CREATININE 4.88* 4.33* 3.30* 2.75* 2.87* 2.80*  CALCIUM 9.2  --  8.0* 8.0* 8.0* 7.9*   Liver Function Tests:  Recent Labs Lab 11/12/14 2207  AST 49*  ALT 34  ALKPHOS 94  BILITOT 0.4  PROT 6.4*  ALBUMIN 3.1*    Recent Labs Lab 11/12/14 2207  LIPASE 16*   No results for input(s): AMMONIA in the last 168 hours. CBC:  Recent Labs Lab 11/12/14 2207 11/13/14 1125 11/14/14 0630 11/15/14 0443 11/16/14 0620 11/17/14 0019  WBC 19.8* 11.9* 7.4 5.4 8.0 8.9  NEUTROABS 18.5*  --   --   --   --   --   HGB 12.6 11.0* 10.4* 10.0* 10.0* 9.5*  HCT 38.0 34.7* 32.0* 30.2* 30.9* 28.9*  MCV 87.6 87.6 88.2 86.0 86.6 85.5  PLT 250 180 187 182 208 209   Cardiac Enzymes: No results for input(s): CKTOTAL, CKMB, CKMBINDEX, TROPONINI in the last 168 hours. BNP: BNP (last 3 results) No results for input(s): BNP in the last 8760 hours.  ProBNP (last 3 results) No results for input(s): PROBNP in the last 8760 hours.  CBG: No results for input(s): GLUCAP in the last 168 hours.     SignedEulogio Bear  [facility] 11/17/2014, 10:53 AM

## 2014-11-19 ENCOUNTER — Encounter (HOSPITAL_BASED_OUTPATIENT_CLINIC_OR_DEPARTMENT_OTHER): Payer: Self-pay | Admitting: *Deleted

## 2014-11-19 LAB — CULTURE, BLOOD (ROUTINE X 2)
Culture: NO GROWTH
Culture: NO GROWTH

## 2014-11-19 NOTE — Anesthesia Preprocedure Evaluation (Addendum)
Anesthesia Evaluation  Patient identified by MRN, date of birth, ID band Patient awake    Reviewed: Allergy & Precautions, NPO status , Patient's Chart, lab work & pertinent test results  Airway Mallampati: II   Neck ROM: Full    Dental  (+) Dental Advisory Given, Teeth Intact   Pulmonary  breath sounds clear to auscultation        Cardiovascular hypertension, Pt. on medications + dysrhythmias (HX of) Atrial Fibrillation Rhythm:Regular  CATH 2014 normal coronary, ECHO 2013 EF 65%, HX of AF in past, sinus now, Frequent PVC on EKG   Neuro/Psych  Headaches,    GI/Hepatic Neg liver ROS, hiatal hernia, GERD-  Medicated,  Endo/Other  negative endocrine ROS  Renal/GU Renal InsufficiencyRenal diseaseRenal stone disease, Creat 2.8, septic shock in past from UTI     Musculoskeletal   Abdominal (+) + obese,   Peds  Hematology  (+) anemia , 9/29   Anesthesia Other Findings   Reproductive/Obstetrics                          Anesthesia Physical Anesthesia Plan  ASA: III  Anesthesia Plan: General   Post-op Pain Management:    Induction: Intravenous  Airway Management Planned: Oral ETT  Additional Equipment:   Intra-op Plan:   Post-operative Plan: Extubation in OR  Informed Consent: I have reviewed the patients History and Physical, chart, labs and discussed the procedure including the risks, benefits and alternatives for the proposed anesthesia with the patient or authorized representative who has indicated his/her understanding and acceptance.     Plan Discussed with:   Anesthesia Plan Comments:         Anesthesia Quick Evaluation

## 2014-11-19 NOTE — Progress Notes (Signed)
NPO AFTER MN.  ARRIVE AT 1030.  CURRENT  LAB RESULTS, EKG, CXR IN CHART AND EPIC.  WILL TAKE LOPRESSOR AND ZANTAC AM  DOS W/ SIPS OF WATER.

## 2014-11-20 ENCOUNTER — Other Ambulatory Visit: Payer: Self-pay | Admitting: Nurse Practitioner

## 2014-11-21 ENCOUNTER — Encounter: Payer: Self-pay | Admitting: Interventional Cardiology

## 2014-11-21 ENCOUNTER — Ambulatory Visit (HOSPITAL_BASED_OUTPATIENT_CLINIC_OR_DEPARTMENT_OTHER): Payer: Medicare Other | Admitting: Anesthesiology

## 2014-11-21 ENCOUNTER — Encounter (HOSPITAL_BASED_OUTPATIENT_CLINIC_OR_DEPARTMENT_OTHER): Payer: Self-pay | Admitting: *Deleted

## 2014-11-21 ENCOUNTER — Ambulatory Visit (HOSPITAL_BASED_OUTPATIENT_CLINIC_OR_DEPARTMENT_OTHER)
Admission: RE | Admit: 2014-11-21 | Discharge: 2014-11-21 | Disposition: A | Discharge status: Home / Self Care | Payer: Medicare Other | Source: Ambulatory Visit | Attending: Urology | Admitting: Urology

## 2014-11-21 ENCOUNTER — Ambulatory Visit (INDEPENDENT_AMBULATORY_CARE_PROVIDER_SITE_OTHER): Payer: Medicare Other | Admitting: Interventional Cardiology

## 2014-11-21 ENCOUNTER — Encounter (HOSPITAL_BASED_OUTPATIENT_CLINIC_OR_DEPARTMENT_OTHER)
Admission: RE | Disposition: A | Discharge status: Home / Self Care | Payer: Self-pay | Source: Ambulatory Visit | Attending: Urology

## 2014-11-21 VITALS — BP 110/62 | HR 73 | Ht 63.0 in | Wt 214.8 lb

## 2014-11-21 DIAGNOSIS — N183 Chronic kidney disease, stage 3 (moderate): Secondary | ICD-10-CM | POA: Diagnosis present

## 2014-11-21 DIAGNOSIS — I252 Old myocardial infarction: Secondary | ICD-10-CM

## 2014-11-21 DIAGNOSIS — Z885 Allergy status to narcotic agent status: Secondary | ICD-10-CM

## 2014-11-21 DIAGNOSIS — N200 Calculus of kidney: Secondary | ICD-10-CM | POA: Diagnosis not present

## 2014-11-21 DIAGNOSIS — I10 Essential (primary) hypertension: Secondary | ICD-10-CM

## 2014-11-21 DIAGNOSIS — Z90710 Acquired absence of both cervix and uterus: Secondary | ICD-10-CM

## 2014-11-21 DIAGNOSIS — Z87440 Personal history of urinary (tract) infections: Secondary | ICD-10-CM

## 2014-11-21 DIAGNOSIS — G43909 Migraine, unspecified, not intractable, without status migrainosus: Secondary | ICD-10-CM | POA: Diagnosis present

## 2014-11-21 DIAGNOSIS — Z87442 Personal history of urinary calculi: Secondary | ICD-10-CM

## 2014-11-21 DIAGNOSIS — K219 Gastro-esophageal reflux disease without esophagitis: Secondary | ICD-10-CM | POA: Diagnosis present

## 2014-11-21 DIAGNOSIS — Z9889 Other specified postprocedural states: Secondary | ICD-10-CM

## 2014-11-21 DIAGNOSIS — Z7982 Long term (current) use of aspirin: Secondary | ICD-10-CM

## 2014-11-21 DIAGNOSIS — R509 Fever, unspecified: Secondary | ICD-10-CM | POA: Diagnosis not present

## 2014-11-21 DIAGNOSIS — E860 Dehydration: Secondary | ICD-10-CM | POA: Diagnosis present

## 2014-11-21 DIAGNOSIS — B373 Candidiasis of vulva and vagina: Secondary | ICD-10-CM | POA: Insufficient documentation

## 2014-11-21 DIAGNOSIS — N289 Disorder of kidney and ureter, unspecified: Secondary | ICD-10-CM

## 2014-11-21 DIAGNOSIS — N10 Acute tubulo-interstitial nephritis: Secondary | ICD-10-CM | POA: Diagnosis present

## 2014-11-21 DIAGNOSIS — I480 Paroxysmal atrial fibrillation: Secondary | ICD-10-CM | POA: Diagnosis present

## 2014-11-21 DIAGNOSIS — A419 Sepsis, unspecified organism: Secondary | ICD-10-CM | POA: Diagnosis not present

## 2014-11-21 DIAGNOSIS — N202 Calculus of kidney with calculus of ureter: Secondary | ICD-10-CM

## 2014-11-21 DIAGNOSIS — N3001 Acute cystitis with hematuria: Secondary | ICD-10-CM | POA: Insufficient documentation

## 2014-11-21 DIAGNOSIS — N179 Acute kidney failure, unspecified: Secondary | ICD-10-CM | POA: Diagnosis not present

## 2014-11-21 DIAGNOSIS — Z9842 Cataract extraction status, left eye: Secondary | ICD-10-CM

## 2014-11-21 DIAGNOSIS — A047 Enterocolitis due to Clostridium difficile: Secondary | ICD-10-CM | POA: Diagnosis not present

## 2014-11-21 DIAGNOSIS — Z79899 Other long term (current) drug therapy: Secondary | ICD-10-CM

## 2014-11-21 DIAGNOSIS — H409 Unspecified glaucoma: Secondary | ICD-10-CM

## 2014-11-21 DIAGNOSIS — I4891 Unspecified atrial fibrillation: Secondary | ICD-10-CM

## 2014-11-21 DIAGNOSIS — Z9841 Cataract extraction status, right eye: Secondary | ICD-10-CM

## 2014-11-21 DIAGNOSIS — D631 Anemia in chronic kidney disease: Secondary | ICD-10-CM | POA: Diagnosis present

## 2014-11-21 DIAGNOSIS — E669 Obesity, unspecified: Secondary | ICD-10-CM

## 2014-11-21 DIAGNOSIS — I129 Hypertensive chronic kidney disease with stage 1 through stage 4 chronic kidney disease, or unspecified chronic kidney disease: Secondary | ICD-10-CM | POA: Diagnosis present

## 2014-11-21 DIAGNOSIS — E785 Hyperlipidemia, unspecified: Secondary | ICD-10-CM | POA: Diagnosis present

## 2014-11-21 DIAGNOSIS — Z961 Presence of intraocular lens: Secondary | ICD-10-CM | POA: Diagnosis present

## 2014-11-21 HISTORY — PX: CYSTOSCOPY/RETROGRADE/URETEROSCOPY/STONE EXTRACTION WITH BASKET: SHX5317

## 2014-11-21 HISTORY — PX: HOLMIUM LASER APPLICATION: SHX5852

## 2014-11-21 SURGERY — CYSTOSCOPY, WITH CALCULUS REMOVAL USING BASKET
Anesthesia: General | Site: Ureter | Laterality: Right

## 2014-11-21 MED ORDER — CEFUROXIME AXETIL 500 MG PO TABS: 500.0000 mg | ORAL_TABLET | Freq: Two times a day (BID) | ORAL | Status: DC

## 2014-11-21 MED ORDER — LACTATED RINGERS IV SOLN
INTRAVENOUS | Status: DC
  Administered 2014-11-21: 11:00:00 via INTRAVENOUS
  Filled 2014-11-21: qty 1000

## 2014-11-21 MED ORDER — ROCURONIUM BROMIDE 100 MG/10ML IV SOLN
INTRAVENOUS | Status: DC | PRN
  Administered 2014-11-21: 5 mg via INTRAVENOUS
  Administered 2014-11-21: 20 mg via INTRAVENOUS

## 2014-11-21 MED ORDER — OXYBUTYNIN CHLORIDE 5 MG PO TABS
5.0000 mg | ORAL_TABLET | Freq: Three times a day (TID) | ORAL | Status: DC
  Administered 2014-11-21: 5 mg via ORAL
  Filled 2014-11-21: qty 1

## 2014-11-21 MED ORDER — PROMETHAZINE HCL 25 MG/ML IJ SOLN
6.2500 mg | INTRAMUSCULAR | Status: DC | PRN
  Filled 2014-11-21: qty 1

## 2014-11-21 MED ORDER — NEOSTIGMINE METHYLSULFATE 10 MG/10ML IV SOLN
INTRAVENOUS | Status: DC | PRN
  Administered 2014-11-21: 3 mg via INTRAVENOUS

## 2014-11-21 MED ORDER — DEXAMETHASONE SODIUM PHOSPHATE 4 MG/ML IJ SOLN
INTRAMUSCULAR | Status: DC | PRN
  Administered 2014-11-21: 4 mg via INTRAVENOUS

## 2014-11-21 MED ORDER — METOPROLOL TARTRATE 25 MG PO TABS: 25.0000 mg | ORAL_TABLET | Freq: Two times a day (BID) | ORAL | Status: DC

## 2014-11-21 MED ORDER — FENTANYL CITRATE (PF) 100 MCG/2ML IJ SOLN
INTRAMUSCULAR | Status: AC
  Filled 2014-11-21: qty 6

## 2014-11-21 MED ORDER — KETOROLAC TROMETHAMINE 30 MG/ML IJ SOLN
INTRAMUSCULAR | Status: DC | PRN
  Administered 2014-11-21: 30 mg via INTRAVENOUS

## 2014-11-21 MED ORDER — ONDANSETRON HCL 4 MG/2ML IJ SOLN
INTRAMUSCULAR | Status: DC | PRN
  Administered 2014-11-21: 4 mg via INTRAVENOUS

## 2014-11-21 MED ORDER — FENTANYL CITRATE (PF) 100 MCG/2ML IJ SOLN
INTRAMUSCULAR | Status: DC | PRN
  Administered 2014-11-21: 100 ug via INTRAVENOUS

## 2014-11-21 MED ORDER — MICONAZOLE NITRATE 2 % VA CREA
TOPICAL_CREAM | VAGINAL | Status: DC | PRN
  Administered 2014-11-21: 1 via VAGINAL

## 2014-11-21 MED ORDER — CEFAZOLIN SODIUM-DEXTROSE 2-3 GM-% IV SOLR
2.0000 g | INTRAVENOUS | Status: AC
  Administered 2014-11-21: 2 g via INTRAVENOUS
  Filled 2014-11-21: qty 50

## 2014-11-21 MED ORDER — SODIUM CHLORIDE 0.9 % IR SOLN
Status: DC | PRN
  Administered 2014-11-21: 4000 mL

## 2014-11-21 MED ORDER — CEFAZOLIN SODIUM-DEXTROSE 2-3 GM-% IV SOLR
INTRAVENOUS | Status: AC
  Filled 2014-11-21: qty 50

## 2014-11-21 MED ORDER — FLUCONAZOLE 100 MG PO TABS: 100.0000 mg | ORAL_TABLET | Freq: Every day | ORAL | Status: DC

## 2014-11-21 MED ORDER — PROPOFOL 10 MG/ML IV BOLUS
INTRAVENOUS | Status: DC | PRN
  Administered 2014-11-21: 150 mg via INTRAVENOUS

## 2014-11-21 MED ORDER — LIDOCAINE HCL (CARDIAC) 20 MG/ML IV SOLN
INTRAVENOUS | Status: DC | PRN
  Administered 2014-11-21: 100 mg via INTRAVENOUS

## 2014-11-21 MED ORDER — OXYBUTYNIN CHLORIDE 5 MG PO TABS
ORAL_TABLET | ORAL | Status: AC
  Filled 2014-11-21: qty 1

## 2014-11-21 MED ORDER — SUCCINYLCHOLINE CHLORIDE 20 MG/ML IJ SOLN
INTRAMUSCULAR | Status: DC | PRN
  Administered 2014-11-21: 100 mg via INTRAVENOUS

## 2014-11-21 MED ORDER — MEPERIDINE HCL 25 MG/ML IJ SOLN
6.2500 mg | INTRAMUSCULAR | Status: DC | PRN
  Filled 2014-11-21: qty 1

## 2014-11-21 MED ORDER — GLYCOPYRROLATE 0.2 MG/ML IJ SOLN
INTRAMUSCULAR | Status: DC | PRN
  Administered 2014-11-21: 0.4 mg via INTRAVENOUS

## 2014-11-21 MED ORDER — FENTANYL CITRATE (PF) 100 MCG/2ML IJ SOLN
25.0000 ug | INTRAMUSCULAR | Status: DC | PRN
  Filled 2014-11-21: qty 1

## 2014-11-21 MED ORDER — AMLODIPINE BESYLATE 5 MG PO TABS: 5.0000 mg | ORAL_TABLET | Freq: Every evening | ORAL | Status: DC

## 2014-11-21 MED ORDER — IOHEXOL 350 MG/ML SOLN
INTRAVENOUS | Status: DC | PRN
  Administered 2014-11-21: 10 mL

## 2014-11-21 MED ORDER — ACETAMINOPHEN 10 MG/ML IV SOLN
INTRAVENOUS | Status: DC | PRN
  Administered 2014-11-21: 1000 mg via INTRAVENOUS

## 2014-11-21 MED ORDER — BELLADONNA ALKALOIDS-OPIUM 16.2-60 MG RE SUPP
RECTAL | Status: DC | PRN
  Administered 2014-11-21: 1 via RECTAL

## 2014-11-21 MED ORDER — BELLADONNA ALKALOIDS-OPIUM 16.2-60 MG RE SUPP
RECTAL | Status: AC
  Filled 2014-11-21: qty 1

## 2014-11-21 SURGICAL SUPPLY — 42 items
ADAPTER CATH URET PLST 4-6FR (CATHETERS) IMPLANT
ADPR CATH URET STRL DISP 4-6FR (CATHETERS)
BAG DRAIN URO-CYSTO SKYTR STRL (DRAIN) ×2 IMPLANT
BAG DRN UROCATH (DRAIN) ×1
BASKET LASER NITINOL 1.9FR (BASKET) IMPLANT
BASKET STNLS GEMINI 4WIRE 3FR (BASKET) IMPLANT
BASKET ZERO TIP NITINOL 2.4FR (BASKET) ×2 IMPLANT
BOOTIES KNEE HIGH SLOAN (MISCELLANEOUS) ×2 IMPLANT
BSKT STON RTRVL 120 1.9FR (BASKET)
BSKT STON RTRVL GEM 120X11 3FR (BASKET)
BSKT STON RTRVL ZERO TP 2.4FR (BASKET) ×2
CANISTER SUCT LVC 12 LTR MEDI- (MISCELLANEOUS) ×1 IMPLANT
CATH CLEAR GEL 3F BACKSTOP (CATHETERS) IMPLANT
CATH INTERMIT  6FR 70CM (CATHETERS) ×1 IMPLANT
CATH URET 5FR 28IN CONE TIP (BALLOONS)
CATH URET 5FR 28IN OPEN ENDED (CATHETERS) IMPLANT
CATH URET 5FR 70CM CONE TIP (BALLOONS) IMPLANT
CATH URET DUAL LUMEN 6-10FR 50 (CATHETERS) IMPLANT
CLOTH BEACON ORANGE TIMEOUT ST (SAFETY) ×2 IMPLANT
ELECT REM PT RETURN 9FT ADLT (ELECTROSURGICAL)
ELECTRODE REM PT RTRN 9FT ADLT (ELECTROSURGICAL) IMPLANT
FIBER LASER TRAC TIP (UROLOGICAL SUPPLIES) ×1 IMPLANT
GLOVE BIO SURGEON STRL SZ7 (GLOVE) ×2 IMPLANT
GLOVE BIOGEL PI IND STRL 6.5 (GLOVE) IMPLANT
GLOVE BIOGEL PI INDICATOR 6.5 (GLOVE) ×4
GOWN STRL REUS W/ TWL LRG LVL3 (GOWN DISPOSABLE) ×1 IMPLANT
GOWN STRL REUS W/ TWL XL LVL3 (GOWN DISPOSABLE) ×1 IMPLANT
GOWN STRL REUS W/TWL LRG LVL3 (GOWN DISPOSABLE) ×3 IMPLANT
GOWN STRL REUS W/TWL XL LVL3 (GOWN DISPOSABLE) ×2
GUIDEWIRE 0.038 PTFE COATED (WIRE) IMPLANT
GUIDEWIRE ANG ZIPWIRE 038X150 (WIRE) IMPLANT
GUIDEWIRE STR DUAL SENSOR (WIRE) ×1 IMPLANT
IV NS 1000ML (IV SOLUTION) ×2
IV NS 1000ML BAXH (IV SOLUTION) IMPLANT
IV NS IRRIG 3000ML ARTHROMATIC (IV SOLUTION) ×3 IMPLANT
KIT BALLIN UROMAX 15FX10 (LABEL) IMPLANT
KIT BALLN UROMAX 15FX4 (MISCELLANEOUS) IMPLANT
KIT BALLN UROMAX 26 75X4 (MISCELLANEOUS)
SET HIGH PRES BAL DIL (LABEL)
SHEATH ACCESS URETERAL 24CM (SHEATH) ×1 IMPLANT
SHEATH ACCESS URETERAL 38CM (SHEATH) IMPLANT
STENT URET 6FRX24 CONTOUR (STENTS) ×1 IMPLANT

## 2014-11-21 NOTE — Transfer of Care (Signed)
Immediate Anesthesia Transfer of Care Note  Patient: [name]  Procedure(s) Performed: Procedure(s) (LRB): CYSTOSCOPY/RIGHT RETROGRADE PYELOGRAM/RIGHT URETEROSCOPY/BASKET EXTRACTION/RIGHT PYELOSCOPY/LASER OF STONE/RIGHT DOUBLE J STENT (Right) HOLMIUM LASER APPLICATION (Right)  Patient Location: PACU  Anesthesia Type: General  Level of Consciousness: awake, oriented, sedated and patient cooperative  Airway & Oxygen Therapy: Patient Spontanous Breathing and Patient connected to face mask oxygen  Post-op Assessment: Report given to PACU RN and Post -op Vital signs reviewed and stable  Post vital signs: Reviewed and stable  Complications: No apparent anesthesia complications

## 2014-11-21 NOTE — Progress Notes (Signed)
Patient ID: [name], female   DOB: Nov 16, 1945, 69 y.o.   MRN: [id number]     Cardiology Office Note   Date:  11/21/2014   ID:  [name], DOB 01-Jan-1946, MRN [id number]  PCP:  [name], MD    No chief complaint on file. HTN   Wt Readings from Last 3 Encounters:  11/21/14 214 lb 12.8 oz (97.433 kg)  11/13/14 214 lb 4.8 oz (97.206 kg)  10/28/14 214 lb (97.07 kg)       History of Present Illness: [name] is a 69 y.o. female  who had sepsis and LV dysfunction related to this in 2013. SHe had a Type II MI at the time. She also had transient AFib as well. She had issues with poor circulation in her toes. EF was severely decreased but returned to normal. SInce leaving the hospital, she has been doing therapy. She walks around the track.  Infection likely came from a kidney stone and she required a stent in her ureter.  She had a positive nuclear study but subsequent to this, had a normal cath.  She had another episode with an infected kidney stone.  She had worsening renal insufficiency.  She needs lithotripsy.  She had a stent for kidney stone.    She has not been walking regularly due to her recent illness. No CP, SHOB. No palpitations.  No problems walking up stairs.   Past Medical History  Diagnosis Date  . Hypertension   . GERD (gastroesophageal reflux disease)   . Glaucoma   . H/O renal calculi 2002 & 2006  . H/O hiatal hernia   . Headache(784.0)     migraine  . Pneumonia     dx 10-06-2014 per CXR--  on 10-27-2014 pt states finished antibiotic and denies cough or fever  . Hyperlipidemia   . History of MI (myocardial infarction)     10/ 2013 in setting of Septic Shock  . History of atrial fibrillation     10/ 2013  in setting of Septic Shock  . History of CHF (congestive heart failure)     10/ 2013 in setting of septic shock  . Sigmoid diverticulosis   . Nephrolithiasis     bilateral  . CKD (chronic kidney disease), stage III    nephrologist-  dr Florene Glen  . Right ureteral stone   . History of acute respiratory failure     10/ 2013  -- ventilated in setting septic shock  . Dysrhythmia     Afib in 2013 when she had septic shock related to stone ureteral obstruction  . Myocardial infarction     Was reported in 2013 during hospitalization with septic shock  . Arthritis     knee's  . Foley catheter in place     Past Surgical History  Procedure Laterality Date  . Cystoscopy w/ ureteral stent placement  04/04/2012    Procedure: CYSTOSCOPY WITH RETROGRADE PYELOGRAM/URETERAL STENT PLACEMENT;  Surgeon: Ailene Rud, MD;  Location: [facility];  Service: Urology;  Laterality: Left;  . Total abdominal hysterectomy w/ bilateral salpingoophorectomy  1993    secondary to fibroids  . Breast biopsy Left 08/23/07    benign fibrocystic with duct ectasia  . Cardiac catheterization  07-11-2012  dr Irish Lack    Abnormal stress test/   normal coronary arteries/  LVEDP  26mmHg  . Cardiovascular stress test  06-26-2012  dr Irish Lack    marked ischemia in the basal anterior, mid anterior, apical inferior  regions/  normal LVF, ef 63%  . Transthoracic echocardiogram  04-09-2012    normal LVF,  ef 60-65%,  mild LAE,  mild TR, trivial MR and PR  . Cataract extraction w/ intraocular lens  implant, bilateral    . Knee arthroscopy Left 02-14-2003  . Laparoscopic cholecystectomy  03-23-2005  . Extracorporeal shock wave lithotripsy  05-28-2012  &  10-08-2012  . Cystoscopy with stent placement Right 10/28/2014    Procedure: RIGHT URETERAL STENT PLACEMENT;  Surgeon: Irine Seal, MD;  Location: [facility];  Service: Urology;  Laterality: Right;     Current Outpatient Prescriptions  Medication Sig Dispense Refill  . amLODipine (NORVASC) 5 MG tablet Take 5 mg by mouth every evening.    . bimatoprost (LUMIGAN) 0.01 % SOLN Place 1 drop into both eyes at bedtime.    . cefUROXime (CEFTIN) 500 MG tablet Take 1 tablet (500 mg total) by  mouth 2 (two) times daily with a meal. 10 tablet 0  . fluconazole (DIFLUCAN) 100 MG tablet Take 1 tablet (100 mg total) by mouth daily. 7 tablet 0  . fluticasone (FLONASE) 50 MCG/ACT nasal spray Place 2 sprays into both nostrils daily as needed for allergies or rhinitis.    Marland Kitchen Ketorolac Tromethamine (SPRIX NA) Place into the nose as needed.    . metoprolol tartrate (LOPRESSOR) 25 MG tablet Take 25 mg by mouth 2 (two) times daily.    . Multiple Vitamin (MULTIVITAMIN WITH MINERALS) TABS Take 1 tablet by mouth daily.    Marland Kitchen nystatin ointment (MYCOSTATIN) Apply 1 application topically daily as needed. (yeast infection)  0  . ranitidine (ZANTAC) 150 MG tablet Take 150 mg by mouth 2 (two) times daily.    . timolol (BETIMOL) 0.5 % ophthalmic solution Place 1 drop into both eyes every morning.      No current facility-administered medications for this visit.    Allergies:   Vicodin    Social History:  The patient  reports that she has never smoked. She has never used smokeless tobacco. She reports that she does not drink alcohol or use illicit drugs.   Family History:  The patient's *family history includes Bladder Cancer in her maternal grandfather; Cancer (age of onset: 67) in her father; Cancer (age of onset: 83) in her mother; Dementia in her mother; Diabetes in her brother; Heart failure in her paternal grandmother; Hypertension in her brother and mother.    ROS:  Please see the history of present illness.   Otherwise, review of systems are positive for fatigue, kidney stones*.   All other systems are reviewed and negative.    PHYSICAL EXAM: VS:  BP 110/62 mmHg  Pulse 73  Ht 5\' 3"  (1.6 m)  Wt 214 lb 12.8 oz (97.433 kg)  BMI 38.06 kg/m2  SpO2 98%  LMP 01/19/1992 , BMI Body mass index is 38.06 kg/(m^2). GEN: Well nourished, well developed, in no acute distress HEENT: normal Neck: no JVD, carotid bruits, or masses Cardiac: RRR; no murmurs, rubs, or gallops,no edema  Respiratory:  clear to  auscultation bilaterally, normal work of breathing GI: soft, nontender, nondistended, + BS MS: no deformity or atrophy Skin: warm and dry, no rash Neuro:  Strength and sensation are intact Psych: euthymic mood, full affect   EKG:   The ekg ordered 10/28/14 demonstrates NSR, no ST changes   Recent Labs: 11/12/2014: ALT 34 11/17/2014: BUN 29*; Creatinine 2.80*; Hemoglobin 9.5*; Platelets 209; Potassium 4.2; Sodium 134*   Lipid Panel  Component Value Date/Time   TRIG 279* 04/11/2012 0420     Other studies Reviewed: Additional studies/ records that were reviewed today with results demonstrating: .   ASSESSMENT AND PLAN:  Atrial fibrillation  Stopped Amiodarone HCl Tablet, 200 MG, 1 tablet, Orally, Once a day in 2/14 Continue Metoprolol Tartrate Tablet, 25 MG, 1 tablet, Orally, Twice a day, 30 day(s), 60, Refills 11 Clean cath Now in NSR. AFib was likely related to stress of illness.  2. Essential hypertension, benign  Well controlled. 3. Acute myocardial infarction, unspecified site, subsequent episode of care  Type II in the setting of sepsis. Clean cath in 2/14 4. Cardiomyopathy  Related to sepsis. Resolved by most recent echo. Likely related to sepsis in 2013. Eval for CAD was negative.  No recent cardiac sx with most recent sepsis.  CKD: Avoid NSAIDs.    Current medicines are reviewed at length with the patient today.  The patient concerns regarding her medicines were addressed.  The following changes have been made:  No change; avoid NSAIDs  Labs/ tests ordered today include: Following renal function No orders of the defined types were placed in this encounter.    Recommend 150 minutes/week of aerobic exercise Low fat, low carb, high fiber diet recommended  Disposition:   FU in prn   Teresita Madura., MD  11/21/2014 9:57 AM    [facility] [place], [place], [place]  [address] Phone: [phone]; Fax: [phone]

## 2014-11-21 NOTE — Patient Instructions (Signed)
**Note De-identified  Obfuscation** Medication Instructions:  Same-No changes  Labwork: None  Testing/Procedures: None  Follow-Up: Your physician recommends that you schedule a follow-up appointment in: as needed      

## 2014-11-21 NOTE — Interval H&P Note (Signed)
History and Physical Interval Note:  11/21/2014 10:59 AM  [name]  has presented today for surgery, with the diagnosis of right upper ureteral stone and right renal pelvic stone  The various methods of treatment have been discussed with the patient and family. After consideration of risks, benefits and other options for treatment, the patient has consented to  Procedure(s): CYSTOSCOPY/RIGHT RETROGRADE PYELOGRAM/RIGHT URETEROSCOPY/BASKET EXTRACTION/RIGHT PYELOSCOPY/LASER OF STONE/RIGHT DOUBLE J STENT (Right) HOLMIUM LASER APPLICATION (Right) as a surgical intervention .  The patient's history has been reviewed, patient examined, no change in status, stable for surgery.  I have reviewed the patient's chart and labs.  Questions were answered to the patient's satisfaction.     Leandre Wien I [name]

## 2014-11-21 NOTE — Op Note (Signed)
Preoperative diagnosis: Right renal calculus  Postoperative diagnosis: Right  renal calculus  Procedure:  1. Cystoscopy 2. Right ureteroscopy and stone removal 3. Ureteroscopic laser lithotripsy 4. Right ureteral stent placement (6Fr x 24 cm) 5. Right retrograde pyelography with interpretation  Surgeon: Gaynelle Arabian, MD  Resident: Langley Adie, MD  Anesthesia: General  Complications: None  Intraoperative findings: Right retrograde pyelography demonstrated no ureteral filling defect.  Patient also had significant yeast around the vagina.  We applied antifungal cream at the end of the case.  EBL: Minimal  Specimens: 1. Right renal calculus  Disposition of specimens: [facility] for stone analysis  Indication: [name] is a 69 y.o. female patient with urolithiasis. After reviewing the management options for treatment, they elected to proceed with the above surgical procedure(s). We have discussed the potential benefits and risks of the procedure, side effects of the proposed treatment, the likelihood of the patient achieving the goals of the procedure, and any potential problems that might occur during the procedure or recuperation. Informed consent has been obtained.  Description of procedure:  The patient was taken to the operating room and general anesthesia was induced.  The patient was placed in the dorsal lithotomy position, prepped and draped in the usual sterile fashion, and preoperative antibiotics were administered. A preoperative time-out was performed.   Cystourethroscopy was performed.  The patient's urethra was examined and was normal. The bladder was then systematically examined in its entirety. There was no evidence for any bladder tumors, stones, or other mucosal pathology.    Attention then turned to the Right ureteral orifice, the existing ureteral stent was removed and a wire place into the right renal pelvis over which a ureteral catheter  was used to intubate the ureteral orifice.  Omnipaque contrast was injected through the ureteral catheter and a retrograde pyelogram was performed with findings as dictated above.  A 0.38 sensor guidewire was then advanced up the Right ureter into the renal pelvis under fluoroscopic guidance.  A 12/14 Fr ureteral access sheath was then advanced over the guide wire. The digital flexible ureteroscope was then advanced through the access sheath into the ureter next to the guidewire and the calculus was identified and was located in the renal pelvis.   The stone was then fragmented with the 200 micron holmium laser fiber.   All sizable stones were then removed with a zero tip nitinol basket.  Reinspection of the ureter/renal pelvis revealed no remaining visible stones or fragments of significant size.   The safety wire was then replaced and the access sheath removed.  The guidewire was backloaded through the cystoscope and a ureteral stent was advance over the wire using Seldinger technique.  The stent was positioned appropriately under fluoroscopic and cystoscopic guidance.  The wire was then removed with an adequate stent curl noted in the renal pelvis as well as in the bladder.  The bladder was then emptied and the procedure ended.  The patient appeared to tolerate the procedure well and without complications.  The patient was able to be awakened and transferred to the recovery unit in satisfactory condition.

## 2014-11-21 NOTE — Anesthesia Procedure Notes (Signed)
Procedure Name: Intubation Date/Time: 11/21/2014 11:53 AM Performed by: Denna Haggard D Pre-anesthesia Checklist: Patient identified, Emergency Drugs available, Suction available and Patient being monitored Patient Re-evaluated:Patient Re-evaluated prior to inductionOxygen Delivery Method: Circle System Utilized Preoxygenation: Pre-oxygenation with 100% oxygen Intubation Type: IV induction Ventilation: Mask ventilation without difficulty Laryngoscope Size: Mac and 3 Grade View: Grade I Tube type: Oral Tube size: 7.0 mm Number of attempts: 1 Airway Equipment and Method: Stylet and Oral airway Placement Confirmation: ETT inserted through vocal cords under direct vision,  positive ETCO2 and breath sounds checked- equal and bilateral Secured at: 22 cm Tube secured with: Tape Dental Injury: Teeth and Oropharynx as per pre-operative assessment

## 2014-11-21 NOTE — H&P (Signed)
Reason For Visit Discuss surgery   Active Problems Problems  1. Bilateral kidney stones (N20.0)   Assessed By: Carolan Clines (Urology); Last Assessed: 08 Apr 2014  History of Present Illness    69 yo female returns today to discuss surgery. She was last seen by Dr. Louis Meckel on 10/27/14 for acute Rt flank pain. She was seen in the ER and CT scan demonstrated a 4.5 mm right proximal ureteral stone with associated hydronephrosis. In addition, there was a larger stone in the right UPJ. In the ER the patient had a urinalysis that appeared to be consistent with infection. The patient was alert on ciprofloxacin, given pain medication and Zofran, and advised to follow up with urology. Dr. Jeffie Pollock did surgery on 10/28/14 for a cysto/Rt JJ stent placement.    Hx of bilateral non-obstucting kidney stones, recurrent UTIs and bladder pain. She was last seen in July of 2015 and a CT scan performed at that time demonstrated a 75mm stone in the right lower pole and 2 adjacent stones measuring 14mm stone in the left lower pole.     The patient has a past medical history significant for urosepsis, 3 years prior. This involved 10 days of intubation and a long recovery from infected obstructing stone. Further, based on the patient's imaging it appears as if she has left kidney.   Past Medical History Problems  1. History of Acute renal insufficiency (N28.9) 2. History of Anxiety (F41.9) 3. History of Atrial fibrillation (I48.91) 4. History of Atrial fibrillation (I48.91) 5. History of Calculus of ureter (N20.1) 6. History of esophageal reflux (Z87.19) 7. History of esophageal reflux (Z87.19) 8. History of glaucoma (Z86.69) 9. History of hypertension (Z86.79) 10. History of hypertension (Z86.79) 11. History of kidney stones (Z87.442) 12. History of migraine headaches (Z86.69) 13. History of Pneumonia 14. History of Sepsis (A41.9) 15. History of ST Elevation Myocardial Infarction  Surgical  History Problems  1. History of Cholecystectomy 2. History of Cholecystectomy 3. History of Cystoscopy With Insertion Of Ureteral Stent Left 4. History of Cystoscopy With Insertion Of Ureteral Stent Right 5. History of Hysterectomy 6. History of Hysterectomy 7. History of Knee Surgery 8. History of Lithotripsy 9. History of Lithotripsy 10. History of Lithotripsy  Current Meds 1. AmLODIPine Besylate 5 MG Oral Tablet;  Therapy: (Recorded:02Apr2014) to Recorded 2. Aspirin 81 MG TABS;  Therapy: (Recorded:21Nov2013) to Recorded 3. Calcium 500 TABS;  Therapy: (Recorded:02Apr2014) to Recorded 4. Lumigan 0.01 % Ophthalmic Solution;  Therapy: (Recorded:21Nov2013) to Recorded 5. Metoprolol Tartrate 25 MG Oral Tablet;  Therapy: (Recorded:02Apr2014) to Recorded 6. Multiple Vitamin TABS;  Therapy: (Recorded:21Nov2013) to Recorded 7. Timolol Maleate 0.5 % Ophthalmic Solution;  Therapy: (Recorded:20Oct2015) to Recorded 8. Zantac 150 MG CAPS;  Therapy: (Recorded:02Apr2014) to Recorded  Allergies Medication  1. Percocet TABS 2. Vicodin TABS  Family History Problems  1. Family history of Death In The Family Father   Died age 67- pancreatic cancer 2. Family history of Death In The Family Mother   Died age 69-Dementia 26. Family history of Nephrolithiasis : Father 4. Family history of Nephrolithiasis : Brother  Social History Problems  1. Denied: History of Alcohol Use 2. Denied: History of Caffeine Use 3. Marital History - Widowed 4. Never A Smoker 5. Occupation:   Herbalist 6. Denied: History of Tobacco Use  Review of Systems Genitourinary, constitutional, skin, eye, otolaryngeal, hematologic/lymphatic, cardiovascular, pulmonary, endocrine, musculoskeletal, gastrointestinal, neurological and psychiatric system(s) were reviewed and pertinent findings if present are noted and are otherwise negative.  Genitourinary: nocturia and hematuria, but no urinary frequency, no  urinary urgency, no dysuria, no incontinence, no urinary hesitancy, urinary stream does not start and stop, no incomplete emptying of bladder, no pelvic pain and initiating urination does not require straining.    Vitals Vital Signs [Data Includes: Last 1 Day]  Recorded: [id number] 08:13AM  Blood Pressure: 127 / 83 Temperature: 98 F Heart Rate: 69  Physical Exam Constitutional: Well nourished and well developed . No acute distress.  ENT:. The ears and nose are normal in appearance.  Neck: The appearance of the neck is normal and no neck mass is present.  Pulmonary: No respiratory distress and normal respiratory rhythm and effort.  Cardiovascular: Heart rate and rhythm are normal . No peripheral edema.  Abdomen: The abdomen is soft and nontender. No masses are palpated. No CVA tenderness. No hernias are palpable. No hepatosplenomegaly noted.  Genitourinary:  Chaperone Present: kim lewis.  Examination of the external genitalia shows normal female external genitalia and no lesions. The urethra is normal in appearance and not tender. There is no urethral mass. Vaginal exam demonstrates atrophy and the vaginal epithelium to be poorly estrogenized, but no abnormalities. The cervix is is absent. The uterus is absent. The adnexa are palpably normal. The bladder is non tender and not distended. The anus is normal on inspection. The perineum is normal on inspection.  Lymphatics: The femoral and inguinal nodes are not enlarged or tender.  Skin: Normal skin turgor, no visible rash and no visible skin lesions.  Neuro/Psych:. Mood and affect are appropriate.    Results/Data  06 Nov 2014 8:03 AM  UA With REFLEX    COLOR RED     APPEARANCE CLOUDY     SPECIFIC GRAVITY 1.025     pH 5.5     GLUCOSE NEG     BILIRUBIN SMALL     KETONE NEG     BLOOD LARGE     PROTEIN 100     UROBILINOGEN 1     NITRITE POS     LEUKOCYTE ESTERASE LARGE     SQUAMOUS EPITHELIAL/HPF RARE     WBC TNTC     CRYSTALS NONE  SEEN     CASTS NONE SEEN     RBC TNTC     BACTERIA MANY     Procedure KUBs examined. Double-J stent is in good position. I'm unable to see either stone. Entire length of the ureters examined, but I am still unable to identify the stone. Gas pattern is normal. Bone density appears normal.     Assessment Assessed  1. Gross hematuria (R31.0) 2. Acute cystitis with hematuria (N30.01) 3. Right ureteral stone (N20.1)  1. recurrent cystitis. Was treated with Levaquin 750mg  for pneumonia qod, then cipro 500mg  bid for 10 days-finished 3 days ago. Will begin Septra DS BID for 10 days  2. KUB today. Reviewed CT from hospital, but PACS/EPIC problem, and CT will not load. Will ck KUB to be sure stent is in good position, and stone can be seen  3. Discussed ureteroscopy adn extractioin of 4.75mm Right mid ureteral stone at L4, and flexible ureteroscopy for laser and basket extraction of 8.67mm stone in the extrinsic UPJ..   Plan Acute cystitis with hematuria  1. Start: Sulfamethoxazole-Trimethoprim 800-160 MG Oral Tablet; 1 tablet  twice a day x 10  days, then 1 tablet daily x 10 days, and then 1/2 tablet daily x 10 days 2. URINE CULTURE; Status:In Progress - Specimen/Data Collected;   Done:  [id number] Acute cystitis with hematuria, Right ureteral stone  3. Follow-up Schedule Surgery Office  Follow-up  Status: Complete  Done: [id number] Bilateral kidney stones  4. Litholink Stone Risk-Urine; Status:Hold For - [employer];  Requested for:19May2016;  Right ureteral stone  5. AU CT-STONE PROTOCOL; Status:Hold For - Appointment,PreCert,Date of Service,Print;  Requested for:19May2016;   1. C/s urine. ( allergy Vicodin, Percocet -hives-)  2. Septra DS  3. No codeine.  4. Sprix.   5. KUB today. Repeat CT as necessry.  6. schedule surgery for next week. ureteroscopy for mid ureteral stone and pyeloscopy for 8.64mm stone. 41F x 22 F JJ stent   Signatures Electronically  signed by : Carolan Clines, M.D.; Nov 06 2014 12:56PM EST

## 2014-11-21 NOTE — Anesthesia Postprocedure Evaluation (Signed)
  Anesthesia Post-op Note  Patient: [name]  Procedure(s) Performed: Procedure(s): CYSTOSCOPY/RIGHT RETROGRADE PYELOGRAM/RIGHT URETEROSCOPY/BASKET EXTRACTION/RIGHT PYELOSCOPY/LASER OF STONE/RIGHT DOUBLE J STENT (Right) HOLMIUM LASER APPLICATION (Right)  Patient Location: PACU  Anesthesia Type:General  Level of Consciousness: awake and alert   Airway and Oxygen Therapy: Patient Spontanous Breathing  Post-op Pain: mild  Post-op Assessment: Post-op Vital signs reviewed, Patient's Cardiovascular Status Stable, Respiratory Function Stable and Patent Airway  Post-op Vital Signs: Reviewed and stable  Last Vitals:  Filed Vitals:   11/21/14 1049  BP: 116/62  Pulse: 72  Temp: 36.5 C  Resp: 20    Complications: No apparent anesthesia complications

## 2014-11-21 NOTE — Discharge Instructions (Addendum)
1. You may see some blood in the urine and may have some burning with urination for 48-72 hours. You also may notice that you have to urinate more frequently or urgently after your procedure which is normal.  2. You should call should you develop an inability urinate, fever > 101, persistent nausea and vomiting that prevents you from eating or drinking to stay hydrated.  3. You have a stent. you will likely urinate more frequently and urgently until the stent is removed and you may experience some discomfort/pain in the lower abdomen and flank especially when urinating. You may take pain medication prescribed to you if needed for pain. You may also intermittently have blood in the urine until the stent is removed. It is essential that you follow up for stent removal as instructed. If the stent is left in place, this could result in permanent damage to and even loss of the kidney, as well as other complications like infections and kidney stones.   Post Anesthesia Home Care Instructions  Activity: Get plenty of rest for the remainder of the day. A responsible adult should stay with you for 24 hours following the procedure.  For the next 24 hours, DO NOT: -Drive a car -Paediatric nurse -Drink alcoholic beverages -Take any medication unless instructed by your physician -Make any legal decisions or sign important papers.  Meals: Start with liquid foods such as gelatin or soup. Progress to regular foods as tolerated. Avoid greasy, spicy, heavy foods. If nausea and/or vomiting occur, drink only clear liquids until the nausea and/or vomiting subsides. Call your physician if vomiting continues.  Special Instructions/Symptoms: Your throat may feel dry or sore from the anesthesia or the breathing tube placed in your throat during surgery. If this causes discomfort, gargle with warm salt water. The discomfort should disappear within 24 hours.  If you had a scopolamine patch placed behind your ear for the  management of post- operative nausea and/or vomiting:  1. The medication in the patch is effective for 72 hours, after which it should be removed.  Wrap patch in a tissue and discard in the trash. Wash hands thoroughly with soap and water. 2. You may remove the patch earlier than 72 hours if you experience unpleasant side effects which may include dry mouth, dizziness or visual disturbances. 3. Avoid touching the patch. Wash your hands with soap and water after contact with the patch.

## 2014-11-22 ENCOUNTER — Emergency Department (HOSPITAL_COMMUNITY): Payer: Medicare Other

## 2014-11-22 ENCOUNTER — Inpatient Hospital Stay (HOSPITAL_COMMUNITY)
Admission: EM | Admit: 2014-11-22 | Discharge: 2014-11-27 | DRG: 854 | Disposition: A | Discharge status: Home / Self Care | Payer: Medicare Other | Attending: Family Medicine | Admitting: Family Medicine

## 2014-11-22 ENCOUNTER — Encounter (HOSPITAL_COMMUNITY): Payer: Self-pay | Admitting: Emergency Medicine

## 2014-11-22 DIAGNOSIS — I4891 Unspecified atrial fibrillation: Secondary | ICD-10-CM | POA: Diagnosis present

## 2014-11-22 DIAGNOSIS — N179 Acute kidney failure, unspecified: Secondary | ICD-10-CM | POA: Diagnosis not present

## 2014-11-22 DIAGNOSIS — N1330 Unspecified hydronephrosis: Secondary | ICD-10-CM

## 2014-11-22 DIAGNOSIS — R509 Fever, unspecified: Secondary | ICD-10-CM

## 2014-11-22 DIAGNOSIS — A419 Sepsis, unspecified organism: Secondary | ICD-10-CM | POA: Diagnosis present

## 2014-11-22 DIAGNOSIS — N12 Tubulo-interstitial nephritis, not specified as acute or chronic: Secondary | ICD-10-CM | POA: Diagnosis present

## 2014-11-22 DIAGNOSIS — N189 Chronic kidney disease, unspecified: Secondary | ICD-10-CM | POA: Diagnosis present

## 2014-11-22 DIAGNOSIS — R197 Diarrhea, unspecified: Secondary | ICD-10-CM | POA: Diagnosis present

## 2014-11-22 DIAGNOSIS — D649 Anemia, unspecified: Secondary | ICD-10-CM | POA: Diagnosis present

## 2014-11-22 DIAGNOSIS — A0472 Enterocolitis due to Clostridium difficile, not specified as recurrent: Secondary | ICD-10-CM | POA: Diagnosis present

## 2014-11-22 DIAGNOSIS — N10 Acute tubulo-interstitial nephritis: Secondary | ICD-10-CM | POA: Diagnosis present

## 2014-11-22 DIAGNOSIS — I10 Essential (primary) hypertension: Secondary | ICD-10-CM | POA: Diagnosis present

## 2014-11-22 DIAGNOSIS — N200 Calculus of kidney: Secondary | ICD-10-CM | POA: Diagnosis not present

## 2014-11-22 LAB — URINE CULTURE: Colony Count: 80000

## 2014-11-22 LAB — COMPREHENSIVE METABOLIC PANEL
ALT: 27 U/L (ref 14–54)
AST: 22 U/L (ref 15–41)
Albumin: 2.4 g/dL — ABNORMAL LOW (ref 3.5–5.0)
Alkaline Phosphatase: 166 U/L — ABNORMAL HIGH (ref 38–126)
Anion gap: 13 (ref 5–15)
BUN: 49 mg/dL — ABNORMAL HIGH (ref 6–20)
CO2: 15 mmol/L — ABNORMAL LOW (ref 22–32)
Calcium: 8.7 mg/dL — ABNORMAL LOW (ref 8.9–10.3)
Chloride: 101 mmol/L (ref 101–111)
Creatinine, Ser: 4.85 mg/dL — ABNORMAL HIGH (ref 0.44–1.00)
GFR calc Af Amer: 10 mL/min — ABNORMAL LOW (ref >60)
GFR calc non Af Amer: 8 mL/min — ABNORMAL LOW (ref >60)
Glucose, Bld: 113 mg/dL — ABNORMAL HIGH (ref 65–99)
Potassium: 4 mmol/L (ref 3.5–5.1)
Sodium: 129 mmol/L — ABNORMAL LOW (ref 135–145)
Total Bilirubin: 0.5 mg/dL (ref 0.3–1.2)
Total Protein: 6.4 g/dL — ABNORMAL LOW (ref 6.5–8.1)

## 2014-11-22 LAB — CBC WITH DIFFERENTIAL/PLATELET
Basophils Absolute: 0 10*3/uL (ref 0.0–0.1)
Basophils Relative: 0 % (ref 0–1)
Eosinophils Absolute: 0 10*3/uL (ref 0.0–0.7)
Eosinophils Relative: 0 % (ref 0–5)
HCT: 26 % — ABNORMAL LOW (ref 36.0–46.0)
Hemoglobin: 8.7 g/dL — ABNORMAL LOW (ref 12.0–15.0)
Lymphocytes Relative: 4 % — ABNORMAL LOW (ref 12–46)
Lymphs Abs: 1.3 10*3/uL (ref 0.7–4.0)
MCH: 28.7 pg (ref 26.0–34.0)
MCHC: 33.5 g/dL (ref 30.0–36.0)
MCV: 85.8 fL (ref 78.0–100.0)
Monocytes Absolute: 1 10*3/uL (ref 0.1–1.0)
Monocytes Relative: 3 % (ref 3–12)
Neutro Abs: 29.9 10*3/uL — ABNORMAL HIGH (ref 1.7–7.7)
Neutrophils Relative %: 93 % — ABNORMAL HIGH (ref 43–77)
Platelets: 380 10*3/uL (ref 150–400)
RBC: 3.03 MIL/uL — ABNORMAL LOW (ref 3.87–5.11)
RDW: 16 % — ABNORMAL HIGH (ref 11.5–15.5)
WBC: 32.2 10*3/uL — ABNORMAL HIGH (ref 4.0–10.5)

## 2014-11-22 LAB — URINALYSIS, ROUTINE W REFLEX MICROSCOPIC
Bilirubin Urine: NEGATIVE
Glucose, UA: NEGATIVE mg/dL
Ketones, ur: NEGATIVE mg/dL
Nitrite: NEGATIVE
Protein, ur: 100 mg/dL — AB
Specific Gravity, Urine: 1.011 (ref 1.005–1.030)
Urobilinogen, UA: 0.2 mg/dL (ref 0.0–1.0)
pH: 6 (ref 5.0–8.0)

## 2014-11-22 LAB — URINE MICROSCOPIC-ADD ON

## 2014-11-22 LAB — I-STAT CG4 LACTIC ACID, ED: Lactic Acid, Venous: 0.8 mmol/L (ref 0.5–2.0)

## 2014-11-22 MED ORDER — SODIUM CHLORIDE 0.9 % IV BOLUS (SEPSIS)
1000.0000 mL | INTRAVENOUS | Status: AC
  Administered 2014-11-22 (×3): 1000 mL via INTRAVENOUS

## 2014-11-22 MED ORDER — DEXTROSE 5 % IV SOLN: 1.0000 g | Freq: Once | INTRAVENOUS | Status: DC

## 2014-11-22 MED ORDER — PIPERACILLIN-TAZOBACTAM 3.375 G IVPB
3.3750 g | Freq: Once | INTRAVENOUS | Status: DC
  Filled 2014-11-22 (×2): qty 50

## 2014-11-22 MED ORDER — VANCOMYCIN HCL IN DEXTROSE 1-5 GM/200ML-% IV SOLN
1000.0000 mg | INTRAVENOUS | Status: DC
  Administered 2014-11-22: 1000 mg via INTRAVENOUS
  Filled 2014-11-22 (×2): qty 200

## 2014-11-22 MED ORDER — RANITIDINE HCL 150 MG/10ML PO SYRP
150.0000 mg | ORAL_SOLUTION | Freq: Once | ORAL | Status: AC
  Administered 2014-11-22: 150 mg via ORAL
  Filled 2014-11-22: qty 10

## 2014-11-22 NOTE — ED Notes (Signed)
MD Zavitz at bedside  

## 2014-11-22 NOTE — ED Notes (Signed)
PA Heather at bedside.

## 2014-11-22 NOTE — ED Notes (Signed)
Second set of cultures not obtained. Will not delay antibiotics for second set.

## 2014-11-22 NOTE — Progress Notes (Signed)
ANTIBIOTIC CONSULT NOTE - INITIAL  Pharmacy Consult for Vancomycin  Indication: rule out sepsis  Allergies  Allergen Reactions  . Vicodin [Hydrocodone-Acetaminophen] Nausea And Vomiting    Patient Measurements: Weight: 214 lb (97.07 kg)  Vital Signs: Temp: 100.1 F (37.8 C) (06/04 1928) Temp Source: Oral (06/04 1928) BP: 112/50 mmHg (06/04 2049) Pulse Rate: 97 (06/04 2049)  Labs:  Recent Labs  11/22/14 1945  WBC 32.2*  HGB 8.7*  PLT 380  CREATININE 4.85*   Estimated Creatinine Clearance: 12 mL/min (by C-G formula based on Cr of 4.85).   Medical History: Past Medical History  Diagnosis Date  . Hypertension   . GERD (gastroesophageal reflux disease)   . Glaucoma   . H/O renal calculi 2002 & 2006  . H/O hiatal hernia   . Headache(784.0)     migraine  . Pneumonia     dx 10-06-2014 per CXR--  on 10-27-2014 pt states finished antibiotic and denies cough or fever  . Hyperlipidemia   . History of MI (myocardial infarction)     10/ 2013 in setting of Septic Shock  . History of atrial fibrillation     10/ 2013  in setting of Septic Shock  . History of CHF (congestive heart failure)     10/ 2013 in setting of septic shock  . Sigmoid diverticulosis   . Nephrolithiasis     bilateral  . CKD (chronic kidney disease), stage III     nephrologist-  dr Florene Glen  . Right ureteral stone   . History of acute respiratory failure     10/ 2013  -- ventilated in setting septic shock  . Dysrhythmia     Afib in 2013 when she had septic shock related to stone ureteral obstruction  . Myocardial infarction     Was reported in 2013 during hospitalization with septic shock  . Arthritis     knee's  . Foley catheter in place      Assessment: 69 y/o F with recent DC from hospital, ureteral stent placement of 6/3, back today with fever/weakness, WBC markedly elevated, pt in acute on chronic renal failure with CrCl <30, other labs as above.   Goal of Therapy:  Vancomycin trough  level 15-20 mcg/ml  Plan:  -Vancomycin 1000 mg IV q48h, adjust dose as renal function allows -Zosyn x 1 ordered in the ED, f/u additional gram negative coverage -Trend WBC, temp, renal function  -Drug levels as indicated   Narda Bonds 11/22/2014,9:12 PM

## 2014-11-22 NOTE — ED Notes (Addendum)
Pt from home c/o fever and weakness that started during the night. She was recently discharged from hospital admission for sepsis related to kidney stone as reported by patient and family. Pt tachycardiac during assessment. Pt has uretal stent placed yesterday. Per daughter pt has been confused.

## 2014-11-22 NOTE — ED Notes (Signed)
Urologist at bedside.

## 2014-11-22 NOTE — H&P (Signed)
PCP:  [name], MD   Cardiology [facility] Urology Gaynelle Arabian  Referring provider Heather  Chief Complaint:   HPI: [name] is a 69 y.o. female   has a past medical history of Hypertension; GERD (gastroesophageal reflux disease); Glaucoma; H/O renal calculi (2002 & 2006); H/O hiatal hernia; Headache(784.0); Pneumonia; Hyperlipidemia; History of MI (myocardial infarction); History of atrial fibrillation; History of CHF (congestive heart failure); Sigmoid diverticulosis; Nephrolithiasis; CKD (chronic kidney disease), stage III; Right ureteral stone; History of acute respiratory failure; Dysrhythmia; Myocardial infarction; Arthritis; and Foley catheter in place.   Presented with  In beginning of May patient developed right flank pain. She was seen in the emergency department and had a CT scan that showed 4.5 mm right proximal ureteral stone and hydronephrosis as well as largest stone right UPJ. At that point her UA was showing evidence of infection. Patient was started on ciprofloxacin on May 10 patient has right JJ stent placement. In the end of May she was admitted for UTI, sepsis and obstructing kidney stone She was discharge home on 5/30 on Ceftin and Follow up with Urology.  On June 3 patient undergone cystoscopy stone removal and laser lithotripsy as well as a right ureteral stent placement. Patient did well initially but tonight developed fever up to 103.  Today patient's presented with fever and weakness white cell count elevated up to 35 patient in acute renal failure from baseline creatinine of 2 up to 4.85 today. Patient meeting sepsis criteria. Dr. Zadie Cleverly is aware will see in AM but requesting medical admission.   Of note patient currently has significant vaginal yeast infection.   Of note patient has history of severe sepsis secondary to E.coli urinary tract infection and obstructed ureteral stone  requiring intubation and LV dysfunction associated with that since  2013 resulting in type II MI. She had transient atrial fibrillation to that episode. Patient have had normal cardiac catheterization. Hospitalist was called for admission for sepsis  Review of Systems:    Pertinent positives include: Fevers, chills, abdominal pain,   Constitutional:  No weight loss, night sweats, , fatigue, weight loss  HEENT:  No headaches, Difficulty swallowing,Tooth/dental problems,Sore throat,  No sneezing, itching, ear ache, nasal congestion, post nasal drip,  Cardio-vascular:  No chest pain, Orthopnea, PND, anasarca, dizziness, palpitations.no Bilateral lower extremity swelling  GI:  No heartburn, indigestion, nausea, vomiting, diarrhea, change in bowel habits, loss of appetite, melena, blood in stool, hematemesis Resp:  no shortness of breath at rest. No dyspnea on exertion, No excess mucus, no productive cough, No non-productive cough, No coughing up of blood.No change in color of mucus.No wheezing. Skin:  no rash or lesions. No jaundice GU:  no dysuria, change in color of urine, no urgency or frequency. No straining to urinate.  No flank pain.  Musculoskeletal:  No joint pain or no joint swelling. No decreased range of motion. No back pain.  Psych:  No change in mood or affect. No depression or anxiety. No memory loss.  Neuro: no localizing neurological complaints, no tingling, no weakness, no double vision, no gait abnormality, no slurred speech, no confusion  Otherwise ROS are negative except for above, 10 systems were reviewed  Past Medical History: Past Medical History  Diagnosis Date  . Hypertension   . GERD (gastroesophageal reflux disease)   . Glaucoma   . H/O renal calculi 2002 & 2006  . H/O hiatal hernia   . Headache(784.0)     migraine  . Pneumonia  dx 10-06-2014 per CXR--  on 10-27-2014 pt states finished antibiotic and denies cough or fever  . Hyperlipidemia   . History of MI (myocardial infarction)     10/ 2013 in setting of  Septic Shock  . History of atrial fibrillation     10/ 2013  in setting of Septic Shock  . History of CHF (congestive heart failure)     10/ 2013 in setting of septic shock  . Sigmoid diverticulosis   . Nephrolithiasis     bilateral  . CKD (chronic kidney disease), stage III     nephrologist-  dr Florene Glen  . Right ureteral stone   . History of acute respiratory failure     10/ 2013  -- ventilated in setting septic shock  . Dysrhythmia     Afib in 2013 when she had septic shock related to stone ureteral obstruction  . Myocardial infarction     Was reported in 2013 during hospitalization with septic shock  . Arthritis     knee's  . Foley catheter in place    Past Surgical History  Procedure Laterality Date  . Cystoscopy w/ ureteral stent placement  04/04/2012    Procedure: CYSTOSCOPY WITH RETROGRADE PYELOGRAM/URETERAL STENT PLACEMENT;  Surgeon: Ailene Rud, MD;  Location: [place];  Service: Urology;  Laterality: Left;  . Total abdominal hysterectomy w/ bilateral salpingoophorectomy  1993    secondary to fibroids  . Breast biopsy Left 08/23/07    benign fibrocystic with duct ectasia  . Cardiac catheterization  07-11-2012  dr Irish Lack    Abnormal stress test/   normal coronary arteries/  LVEDP  31mHg  . Cardiovascular stress test  06-26-2012  dr vIrish Lack   marked ischemia in the basal anterior, mid anterior, apical inferior regions/  normal LVF, ef 63%  . Transthoracic echocardiogram  04-09-2012    normal LVF,  ef 60-65%,  mild LAE,  mild TR, trivial MR and PR  . Cataract extraction w/ intraocular lens  implant, bilateral    . Knee arthroscopy Left 02-14-2003  . Laparoscopic cholecystectomy  03-23-2005  . Extracorporeal shock wave lithotripsy  05-28-2012  &  10-08-2012  . Cystoscopy with stent placement Right 10/28/2014    Procedure: RIGHT URETERAL STENT PLACEMENT;  Surgeon: JIrine Seal MD;  [facility]  Service: Urology;  Laterality: Right;      Medications: Prior to Admission medications   Medication Sig Start Date End Date Taking? Authorizing Provider  amLODipine (NORVASC) 5 MG tablet Take 1 tablet (5 mg total) by mouth every evening. 11/21/14  Yes JJettie Booze MD  aspirin 81 MG tablet Take 81 mg by mouth daily.   Yes Historical Provider, MD  bimatoprost (LUMIGAN) 0.01 % SOLN Place 1 drop into both eyes at bedtime.   Yes Historical Provider, MD  cefUROXime (CEFTIN) 500 MG tablet Take 1 tablet (500 mg total) by mouth 2 (two) times daily with a meal. 11/21/14  Yes EEdwyna Ready MD  fluconazole (DIFLUCAN) 100 MG tablet Take 1 tablet (100 mg total) by mouth daily. 11/17/14  Yes JGeradine Girt DO  fluconazole (DIFLUCAN) 100 MG tablet Take 1 tablet (100 mg total) by mouth daily. Take two tabs once, then one tab daily thereafter 11/21/14  Yes EEdwyna Ready MD  metoprolol tartrate (LOPRESSOR) 25 MG tablet Take 1 tablet (25 mg total) by mouth 2 (two) times daily. 11/21/14  Yes JJettie Booze MD  miconazole (MICOTIN) 2 % cream Apply 1 application topically 2 (  two) times daily.   Yes Historical Provider, MD  ranitidine (ZANTAC) 150 MG tablet Take 150 mg by mouth 2 (two) times daily.   Yes Historical Provider, MD  timolol (BETIMOL) 0.5 % ophthalmic solution Place 1 drop into both eyes every morning.    Yes Historical Provider, MD  Ketorolac Tromethamine (SPRIX NA) Place into the nose as needed.    Historical Provider, MD  nystatin ointment (MYCOSTATIN) Apply 1 application topically daily as needed. (yeast infection) 09/18/14   Historical Provider, MD    Allergies:   Allergies  Allergen Reactions  . Vicodin [Hydrocodone-Acetaminophen] Nausea And Vomiting    Social History:  Ambulatory  Independently  Lives at home   With family     reports that she has never smoked. She has never used smokeless tobacco. She reports that she does not drink alcohol or use illicit drugs.    Family History: family history includes Bladder  Cancer in her maternal grandfather; Cancer (age of onset: 77) in her father; Cancer (age of onset: 61) in her mother; Dementia in her mother; Diabetes in her brother; Heart failure in her paternal grandmother; Hypertension in her brother and mother.    Physical Exam: Patient Vitals for the past 24 hrs:  BP Temp Temp src Pulse Resp SpO2 Weight  11/22/14 2258 (!) 116/54 mmHg - - 67 18 97 % -  11/22/14 2217 (!) 116/53 mmHg - - 69 17 99 % -  11/22/14 2146 110/67 mmHg - - 86 18 96 % -  11/22/14 2130 110/67 mmHg - - - - - -  11/22/14 2100 - - - - 20 - -  11/22/14 2049 (!) 112/50 mmHg - - 97 22 96 % -  11/22/14 1939 - - - - - 94 % -  11/22/14 1928 116/61 mmHg 100.1 F (37.8 C) Oral 120 22 91 % 97.07 kg (214 lb)    1. General:  in No Acute distress 2. Psychological: Alert and  Oriented 3. Head/ENT:    Dry Mucous Membranes                          Head Non traumatic, neck supple                          Normal   Dentition 4. SKIN:  decreased Skin turgor,  Skin clean Dry and intact no rash 5. Heart: Regular rate and rhythm no Murmur, Rub or gallop 6. Lungs: Clear to auscultation bilaterally, no wheezes or crackles   7. Abdomen: Soft, non-tender, Non distended 8. Lower extremities: no clubbing, cyanosis, trace edema 9. Neurologically Grossly intact, moving all 4 extremities equally 10. MSK: Normal range of motion  body mass index is 37.92 kg/(m^2).   Labs on Admission:   Results for orders placed or performed during the hospital encounter of 11/22/14 (from the past 24 hour(s))  Comprehensive metabolic panel     Status: Abnormal   Collection Time: 11/22/14  7:45 PM  Result Value Ref Range   Sodium 129 (L) 135 - 145 mmol/L   Potassium 4.0 3.5 - 5.1 mmol/L   Chloride 101 101 - 111 mmol/L   CO2 15 (L) 22 - 32 mmol/L   Glucose, Bld 113 (H) 65 - 99 mg/dL   BUN 49 (H) 6 - 20 mg/dL   Creatinine, Ser 4.85 (H) 0.44 - 1.00 mg/dL   Calcium 8.7 (L) 8.9 - 10.3 mg/dL  Total Protein 6.4 (L) 6.5  - 8.1 g/dL   Albumin 2.4 (L) 3.5 - 5.0 g/dL   AST 22 15 - 41 U/L   ALT 27 14 - 54 U/L   Alkaline Phosphatase 166 (H) 38 - 126 U/L   Total Bilirubin 0.5 0.3 - 1.2 mg/dL   GFR calc non Af Amer 8 (L) >60 mL/min   GFR calc Af Amer 10 (L) >60 mL/min   Anion gap 13 5 - 15  CBC with Differential     Status: Abnormal   Collection Time: 11/22/14  7:45 PM  Result Value Ref Range   WBC 32.2 (H) 4.0 - 10.5 K/uL   RBC 3.03 (L) 3.87 - 5.11 MIL/uL   Hemoglobin 8.7 (L) 12.0 - 15.0 g/dL   HCT 26.0 (L) 36.0 - 46.0 %   MCV 85.8 78.0 - 100.0 fL   MCH 28.7 26.0 - 34.0 pg   MCHC 33.5 30.0 - 36.0 g/dL   RDW 16.0 (H) 11.5 - 15.5 %   Platelets 380 150 - 400 K/uL   Neutrophils Relative % 93 (H) 43 - 77 %   Lymphocytes Relative 4 (L) 12 - 46 %   Monocytes Relative 3 3 - 12 %   Eosinophils Relative 0 0 - 5 %   Basophils Relative 0 0 - 1 %   Neutro Abs 29.9 (H) 1.7 - 7.7 K/uL   Lymphs Abs 1.3 0.7 - 4.0 K/uL   Monocytes Absolute 1.0 0.1 - 1.0 K/uL   Eosinophils Absolute 0.0 0.0 - 0.7 K/uL   Basophils Absolute 0.0 0.0 - 0.1 K/uL   Smear Review MORPHOLOGY UNREMARKABLE   I-Stat CG4 Lactic Acid, ED (Not at [facility] or [facility])     Status: None   Collection Time: 11/22/14  8:02 PM  Result Value Ref Range   Lactic Acid, Venous 0.80 0.5 - 2.0 mmol/L  Urinalysis, Routine w reflex microscopic     Status: Abnormal   Collection Time: 11/22/14  8:48 PM  Result Value Ref Range   Color, Urine YELLOW YELLOW   APPearance TURBID (A) CLEAR   Specific Gravity, Urine 1.011 1.005 - 1.030   pH 6.0 5.0 - 8.0   Glucose, UA NEGATIVE NEGATIVE mg/dL   Hgb urine dipstick LARGE (A) NEGATIVE   Bilirubin Urine NEGATIVE NEGATIVE   Ketones, ur NEGATIVE NEGATIVE mg/dL   Protein, ur 100 (A) NEGATIVE mg/dL   Urobilinogen, UA 0.2 0.0 - 1.0 mg/dL   Nitrite NEGATIVE NEGATIVE   Leukocytes, UA LARGE (A) NEGATIVE  Urine microscopic-add on     Status: Abnormal   Collection Time: 11/22/14  8:48 PM  Result Value Ref Range   Squamous Epithelial  / LPF RARE RARE   WBC, UA TOO NUMEROUS TO COUNT <3 WBC/hpf   RBC / HPF 11-20 <3 RBC/hpf   Bacteria, UA MANY (A) RARE    UA evidence of UTI  Lab Results  Component Value Date   HGBA1C 5.6 11/13/2014    Estimated Creatinine Clearance: 12 mL/min (by C-G formula based on Cr of 4.85).  BNP (last 3 results) No results for input(s): PROBNP in the last 8760 hours.  Other results:  I have pearsonaly reviewed this: ECG REPORT Not obtained   [facility] Weights   11/22/14 1928  Weight: 97.07 kg (214 lb)     Cultures:    Component Value Date/Time   SDES URINE, RANDOM CYSTOSCOPE URINE 11/21/2014 1223   SPECREQUEST PATIENT ON FOLLOWING ANCEF 2GM, CEFTIN 500MG 11/21/2014 1223  CULT YEAST Performed at [facility]  11/21/2014 1223   REPTSTATUS 11/22/2014 FINAL 11/21/2014 1223     Radiological Exams on Admission: Dg Chest 2 View  11/22/2014   CLINICAL DATA:  Confusion, fever, and shortness of breath for a few hr, onset of fever and weakness last night, recently discharge from hospital for sepsis related to kidney stone, tachycardia, ureteral stent placement yesterday, history hypertension, CHF, atrial fibrillation, MI, GERD  EXAM: CHEST  2 VIEW  COMPARISON:  10/29/2014  FINDINGS: Upper normal size of cardiac silhouette.  Large hiatal hernia.  Slight pulmonary vascular congestion.  Mediastinal contours otherwise normal.  Minimal bibasilar atelectasis.  No acute infiltrate, pleural effusion or pneumothorax.  Bones unremarkable.  IMPRESSION: Minimal bibasilar atelectasis.  Large hiatal hernia.   Electronically Signed   By: Lavonia Dana M.D.   On: 11/22/2014 20:23   Dg Abd 1 View  11/22/2014   CLINICAL DATA:  Fever. Altered mental status. Ureteral stent placement yesterday.  EXAM: ABDOMEN - 1 VIEW  COMPARISON:  Radiographs 11/13/2014.  CT 11/10/2014  FINDINGS: Right ureteral stent in place, proximal and distal coils in the expected location. The renal calculi on prior CT are not well seen  radiographically. There are no dilated bowel loops to suggest obstruction. No free intra-abdominal air.  IMPRESSION: Right ureteral stent in place, in expected location. The renal calculi on prior CT are not well seen radiographically.   Electronically Signed   By: Jeb Levering M.D.   On: 11/22/2014 22:31    Chart has been reviewed  Family  at  Bedside  plan of care was discussed with Daughter Loisann Roach  Assessment/Plan  69 year old female history of atrial fibrillation, recurrent sepsis urinary to urinary tract infection and kidney stones presents after recent lithotripsy and ureteral stent placement the fever chills and evidence of sepsis  Present on Admission:  . Sepsis - most likely secondary to urinary tract infection. Patient has history of UTI-induced severe sepsis in the past. Blood pressure somewhat soft down to 105/62. Patient met sepsis criteria in emergency department. We will admit to step down, broad-spectrum antibody await results of blood and urine culture.  . Essential hypertension, benign hold blood pressure medications given soft blood pressures.  . Atrial fibrillation patient usually metoprolol coronary hold given hypertension can restart one patient is stable. History of atrial fibrillation is paroxysmal will obtain EKG to confirm rhythm. Patient endorses some chest discomfort although no chest pain will cycle cardiac enzymes acute on chronic renal failure - the setting of severe infection will obtain renal ultrasound to rule out stent failure, also to evaluate for hydronephrosis as this could be source for ongoing nfection Nephrology is aware we'll see patient in the morning. If renal function does not improve we'll need to have renal consult will administer gentle IV fluids overnight. Patient this point not hypotensive although blood pressure soft. There is some evidence of peripheral edema.  Prophylaxis:   Lovenox   CODE STATUS:  FULL CODE as per patient    Disposition:  To home once workup is complete and patient is stable  Other plan as per orders.  I have spent a total of 55 min on this admission  Lodema Parma 11/22/2014, 11:22 PM  [facility]  Pager [phone]   after 2 AM please page floor coverage PA If 7AM-7PM, please contact the day team taking care of the patient  [email or web address]  Password [email or web address]

## 2014-11-22 NOTE — Consult Note (Signed)
Urology Consult  Referring physician:  Mirian Capuchin Reason for referral: fever, confusion   History of Present Illness:  [name] is a 69 yo female with hx of E. Coli Urosepsis, and encephalopathy, post right JJ stentingfor right ureteropelvic stone ( Dr. Jeffie Pollock), and now 1day post lithotripsy. She presents with fever of 101 and feeling "confused". Took temp at home=103. No nausea, no vomiting, no gross hematuria, no abdominal or back pain, no dysuria.   She has a hx of :  CKD, III;  A. Fibrillation and MI with septic shock CHF Acute respiratory failure   Past Medical History  Diagnosis Date  . Hypertension   . GERD (gastroesophageal reflux disease)   . Glaucoma   . H/O renal calculi 2002 & 2006  . H/O hiatal hernia   . Headache(784.0)     migraine  . Pneumonia     dx 10-06-2014 per CXR--  on 10-27-2014 pt states finished antibiotic and denies cough or fever  . Hyperlipidemia   . History of MI (myocardial infarction)     10/ 2013 in setting of Septic Shock  . History of atrial fibrillation     10/ 2013  in setting of Septic Shock  . History of CHF (congestive heart failure)     10/ 2013 in setting of septic shock  . Sigmoid diverticulosis   . Nephrolithiasis     bilateral  . CKD (chronic kidney disease), stage III     nephrologist-  dr Florene Glen  . Right ureteral stone   . History of acute respiratory failure     10/ 2013  -- ventilated in setting septic shock  . Dysrhythmia     Afib in 2013 when she had septic shock related to stone ureteral obstruction  . Myocardial infarction     Was reported in 2013 during hospitalization with septic shock  . Arthritis     knee's  . Foley catheter in place    Past Surgical History  Procedure Laterality Date  . Cystoscopy w/ ureteral stent placement  04/04/2012    Procedure: CYSTOSCOPY WITH RETROGRADE PYELOGRAM/URETERAL STENT PLACEMENT;  Surgeon: Ailene Rud, MD;  Location: [place];  Service: Urology;  Laterality: Left;  .  Total abdominal hysterectomy w/ bilateral salpingoophorectomy  1993    secondary to fibroids  . Breast biopsy Left 08/23/07    benign fibrocystic with duct ectasia  . Cardiac catheterization  07-11-2012  dr Irish Lack    Abnormal stress test/   normal coronary arteries/  LVEDP  6mHg  . Cardiovascular stress test  06-26-2012  dr vIrish Lack   marked ischemia in the basal anterior, mid anterior, apical inferior regions/  normal LVF, ef 63%  . Transthoracic echocardiogram  04-09-2012    normal LVF,  ef 60-65%,  mild LAE,  mild TR, trivial MR and PR  . Cataract extraction w/ intraocular lens  implant, bilateral    . Knee arthroscopy Left 02-14-2003  . Laparoscopic cholecystectomy  03-23-2005  . Extracorporeal shock wave lithotripsy  05-28-2012  &  10-08-2012  . Cystoscopy with stent placement Right 10/28/2014    Procedure: RIGHT URETERAL STENT PLACEMENT;  Surgeon: JIrine Seal MD;  [facility]  Service: Urology;  Laterality: Right;    Medications:originally placed on Septra to try to avoid Cipro 2ndary recent black box warning from FDA. Released from Hospital on CKeeler and sent home from lithotripsy on Ceftin.   Allergies:  Allergies  Allergen Reactions  . Vicodin [Hydrocodone-Acetaminophen] Nausea And Vomiting  Family History  Problem Relation Age of Onset  . Hypertension Mother   . Cancer Mother 48    breast  . Dementia Mother   . Hypertension Brother   . Diabetes Brother   . Cancer Father 64    pancreatic  . Heart failure Paternal Grandmother   . Bladder Cancer Maternal Grandfather     Social History:  reports that she has never smoked. She has never used smokeless tobacco. She reports that she does not drink alcohol or use illicit drugs.  ROS: felt confused at home, but normal in ED.  Mouth dry.   Physical Exam:  Vital signs in last 24 hours: Temp:  [100.1 F (37.8 C)] 100.1 F (37.8 C) (06/04 1928) Pulse Rate:  [86-120] 86 (06/04 2146) Resp:   [18-22] 18 (06/04 2146) BP: (110-116)/(50-67) 110/67 mmHg (06/04 2146) SpO2:  [91 %-96 %] 96 % (06/04 2146) Weight:  [97.07 kg (214 lb)] 97.07 kg (214 lb) (06/04 1928) Physical Exam: Abdomen: Obese, decreased BS. No organomegaly or masses. No tenderness. No CVA pain.   Laboratory Data:  Results for orders placed or performed during the hospital encounter of 11/22/14 (from the past 72 hour(s))  Comprehensive metabolic panel     Status: Abnormal   Collection Time: 11/22/14  7:45 PM  Result Value Ref Range   Sodium 129 (L) 135 - 145 mmol/L   Potassium 4.0 3.5 - 5.1 mmol/L   Chloride 101 101 - 111 mmol/L   CO2 15 (L) 22 - 32 mmol/L   Glucose, Bld 113 (H) 65 - 99 mg/dL   BUN 49 (H) 6 - 20 mg/dL   Creatinine, Ser 4.85 (H) 0.44 - 1.00 mg/dL   Calcium 8.7 (L) 8.9 - 10.3 mg/dL   Total Protein 6.4 (L) 6.5 - 8.1 g/dL   Albumin 2.4 (L) 3.5 - 5.0 g/dL   AST 22 15 - 41 U/L   ALT 27 14 - 54 U/L   Alkaline Phosphatase 166 (H) 38 - 126 U/L   Total Bilirubin 0.5 0.3 - 1.2 mg/dL   GFR calc non Af Amer 8 (L) >60 mL/min   GFR calc Af Amer 10 (L) >60 mL/min    Comment: (NOTE) The eGFR has been calculated using the CKD EPI equation. This calculation has not been validated in all clinical situations. eGFR's persistently <60 mL/min signify possible Chronic Kidney Disease.    Anion gap 13 5 - 15  CBC with Differential     Status: Abnormal   Collection Time: 11/22/14  7:45 PM  Result Value Ref Range   WBC 32.2 (H) 4.0 - 10.5 K/uL    Comment: REPEATED TO VERIFY   RBC 3.03 (L) 3.87 - 5.11 MIL/uL   Hemoglobin 8.7 (L) 12.0 - 15.0 g/dL   HCT 26.0 (L) 36.0 - 46.0 %   MCV 85.8 78.0 - 100.0 fL   MCH 28.7 26.0 - 34.0 pg   MCHC 33.5 30.0 - 36.0 g/dL   RDW 16.0 (H) 11.5 - 15.5 %   Platelets 380 150 - 400 K/uL   Neutrophils Relative % 93 (H) 43 - 77 %   Lymphocytes Relative 4 (L) 12 - 46 %   Monocytes Relative 3 3 - 12 %   Eosinophils Relative 0 0 - 5 %   Basophils Relative 0 0 - 1 %   Neutro Abs 29.9  (H) 1.7 - 7.7 K/uL   Lymphs Abs 1.3 0.7 - 4.0 K/uL   Monocytes Absolute 1.0 0.1 - 1.0  K/uL   Eosinophils Absolute 0.0 0.0 - 0.7 K/uL   Basophils Absolute 0.0 0.0 - 0.1 K/uL   Smear Review MORPHOLOGY UNREMARKABLE   I-Stat CG4 Lactic Acid, ED (Not at [facility] or [facility])     Status: None   Collection Time: 11/22/14  8:02 PM  Result Value Ref Range   Lactic Acid, Venous 0.80 0.5 - 2.0 mmol/L  Urinalysis, Routine w reflex microscopic     Status: Abnormal   Collection Time: 11/22/14  8:48 PM  Result Value Ref Range   Color, Urine YELLOW YELLOW   APPearance TURBID (A) CLEAR   Specific Gravity, Urine 1.011 1.005 - 1.030   pH 6.0 5.0 - 8.0   Glucose, UA NEGATIVE NEGATIVE mg/dL   Hgb urine dipstick LARGE (A) NEGATIVE   Bilirubin Urine NEGATIVE NEGATIVE   Ketones, ur NEGATIVE NEGATIVE mg/dL   Protein, ur 100 (A) NEGATIVE mg/dL   Urobilinogen, UA 0.2 0.0 - 1.0 mg/dL   Nitrite NEGATIVE NEGATIVE   Leukocytes, UA LARGE (A) NEGATIVE  Urine microscopic-add on     Status: Abnormal   Collection Time: 11/22/14  8:48 PM  Result Value Ref Range   Squamous Epithelial / LPF RARE RARE   WBC, UA TOO NUMEROUS TO COUNT <3 WBC/hpf   RBC / HPF 11-20 <3 RBC/hpf   Bacteria, UA MANY (A) RARE   Recent Results (from the past 240 hour(s))  Blood culture (routine x 2)     Status: None   Collection Time: 11/13/14 12:09 AM  Result Value Ref Range Status   Specimen Description BLOOD RIGHT ARM  Final   Special Requests BOTTLES DRAWN AEROBIC AND ANAEROBIC 5CC  Final   Culture   Final    NO GROWTH 5 DAYS Performed at [facility]    Report Status 11/19/2014 FINAL  Final  Blood culture (routine x 2)     Status: None   Collection Time: 11/13/14 12:16 AM  Result Value Ref Range Status   Specimen Description BLOOD LEFT ARM  Final   Special Requests BOTTLES DRAWN AEROBIC AND ANAEROBIC 5CC  Final   Culture   Final    NO GROWTH 5 DAYS Performed at [facility]    Report Status 11/19/2014 FINAL  Final   Urine culture     Status: None   Collection Time: 11/13/14  1:25 AM  Result Value Ref Range Status   Specimen Description URINE, CATHETERIZED  Final   Special Requests NONE  Final   Colony Count   Final    40,000 COLONIES/ML Performed at [facility]    Culture YEAST Performed at [facility]   Final   Report Status 11/14/2014 FINAL  Final  Urine culture     Status: None   Collection Time: 11/21/14 12:23 PM  Result Value Ref Range Status   Specimen Description URINE, RANDOM CYSTOSCOPE URINE  Final   Special Requests PATIENT ON FOLLOWING ANCEF 2GM, CEFTIN 500MG  Final   Colony Count   Final    80,000 COLONIES/ML Performed at [facility]    Culture YEAST Performed at [facility]   Final   Report Status 11/22/2014 FINAL  Final   Creatinine:  Recent Labs  11/16/14 0620 11/17/14 0019 11/22/14 1945  CREATININE 2.87* 2.80* 4.85*     Impression/Assessment:   69 yo female with hx of E.coli  UTI and apparent chronic UTI in pt recently treated at [facility] for E. coli septic shock. Most recent cultures show  yeast only. KUB shows JJ stent in good position, and we will leave in place for now.Her wbc is elevated at 32k,and she appears somewhat dehydrated. She has Acute on Chronic renal failure.   Plan:    Medicine Rx for sepsis and renal failure. Will leave JJ stent in place.Will follow with medicine.    Jantz Main I Meliana Canner 11/22/2014, 10:04 PM

## 2014-11-22 NOTE — ED Provider Notes (Signed)
CSN: [id number]     Arrival date & time 11/22/14  1914 History   First MD Initiated Contact with Patient 11/22/14 1919     Chief Complaint  Patient presents with  . Altered Mental Status  . Fever     (Consider location/radiation/quality/duration/timing/severity/associated sxs/prior Treatment) HPI Comments: Patient with a history of Urosepsis, Renal Calculi, Septic Shock presents today with fever and altered mental status.  She had lithotripsy with right ureteral stent placement done by Urology yesterday.  She reports that she began having a fever this afternoon approximately 2-3 hours prior to arrival.  She reports a fever of 103 at home.  She did not take any antipyretics prior to arrival.  Temp is 100.1 F upon arrival in the ED.  Her daughter also reports that the patient has appeared to be more confused today.  Patient denies any pain at this time.  She states that she has been urinating normally.  She denies nausea, vomiting, cough, SOB, headache, vision changes, focal weakness, numbness, tingling, difficulty swallowing, or difficulty speaking.  She was recently hospitalized on 11/13/14 for sepsis secondary to Pyelonephritis.  She was discharged on 11/17/14.  She is currently on Ceftin to treat the Pyelonephritis.    The history is provided by the patient.    Past Medical History  Diagnosis Date  . Hypertension   . GERD (gastroesophageal reflux disease)   . Glaucoma   . H/O renal calculi 2002 & 2006  . H/O hiatal hernia   . Headache(784.0)     migraine  . Pneumonia     dx 10-06-2014 per CXR--  on 10-27-2014 pt states finished antibiotic and denies cough or fever  . Hyperlipidemia   . History of MI (myocardial infarction)     10/ 2013 in setting of Septic Shock  . History of atrial fibrillation     10/ 2013  in setting of Septic Shock  . History of CHF (congestive heart failure)     10/ 2013 in setting of septic shock  . Sigmoid diverticulosis   . Nephrolithiasis     bilateral  .  CKD (chronic kidney disease), stage III     nephrologist-  dr Florene Glen  . Right ureteral stone   . History of acute respiratory failure     10/ 2013  -- ventilated in setting septic shock  . Dysrhythmia     Afib in 2013 when she had septic shock related to stone ureteral obstruction  . Myocardial infarction     Was reported in 2013 during hospitalization with septic shock  . Arthritis     knee's  . Foley catheter in place    Past Surgical History  Procedure Laterality Date  . Cystoscopy w/ ureteral stent placement  04/04/2012    Procedure: CYSTOSCOPY WITH RETROGRADE PYELOGRAM/URETERAL STENT PLACEMENT;  Surgeon: Ailene Rud, MD;  Location: [place];  Service: Urology;  Laterality: Left;  . Total abdominal hysterectomy w/ bilateral salpingoophorectomy  1993    secondary to fibroids  . Breast biopsy Left 08/23/07    benign fibrocystic with duct ectasia  . Cardiac catheterization  07-11-2012  dr Irish Lack    Abnormal stress test/   normal coronary arteries/  LVEDP  9mmHg  . Cardiovascular stress test  06-26-2012  dr Irish Lack    marked ischemia in the basal anterior, mid anterior, apical inferior regions/  normal LVF, ef 63%  . Transthoracic echocardiogram  04-09-2012    normal LVF,  ef 60-65%,  mild LAE,  mild  TR, trivial MR and PR  . Cataract extraction w/ intraocular lens  implant, bilateral    . Knee arthroscopy Left 02-14-2003  . Laparoscopic cholecystectomy  03-23-2005  . Extracorporeal shock wave lithotripsy  05-28-2012  &  10-08-2012  . Cystoscopy with stent placement Right 10/28/2014    Procedure: RIGHT URETERAL STENT PLACEMENT;  Surgeon: Irine Seal, MD;  Location: [facility];  Service: Urology;  Laterality: Right;   Family History  Problem Relation Age of Onset  . Hypertension Mother   . Cancer Mother 93    breast  . Dementia Mother   . Hypertension Brother   . Diabetes Brother   . Cancer Father 48    pancreatic  . Heart failure Paternal Grandmother    . Bladder Cancer Maternal Grandfather    History  Substance Use Topics  . Smoking status: Never Smoker   . Smokeless tobacco: Never Used  . Alcohol Use: No   OB History    Gravida Para Term Preterm AB TAB SAB Ectopic Multiple Living   1 1 1       1      Review of Systems  All other systems reviewed and are negative.     Allergies  Vicodin  Home Medications   Prior to Admission medications   Medication Sig Start Date End Date Taking? Authorizing Provider  amLODipine (NORVASC) 5 MG tablet Take 1 tablet (5 mg total) by mouth every evening. 11/21/14   Jettie Booze, MD  bimatoprost (LUMIGAN) 0.01 % SOLN Place 1 drop into both eyes at bedtime.    Historical Provider, MD  cefUROXime (CEFTIN) 500 MG tablet Take 1 tablet (500 mg total) by mouth 2 (two) times daily with a meal. 11/21/14   Edwyna Ready, MD  fluconazole (DIFLUCAN) 100 MG tablet Take 1 tablet (100 mg total) by mouth daily. 11/17/14   Geradine Girt, DO  fluconazole (DIFLUCAN) 100 MG tablet Take 1 tablet (100 mg total) by mouth daily. Take two tabs once, then one tab daily thereafter 11/21/14   Edwyna Ready, MD  fluticasone [facility]) 50 MCG/ACT nasal spray Place 2 sprays into both nostrils daily as needed for allergies or rhinitis.    Historical Provider, MD  Ketorolac Tromethamine (SPRIX NA) Place into the nose as needed.    Historical Provider, MD  metoprolol tartrate (LOPRESSOR) 25 MG tablet Take 1 tablet (25 mg total) by mouth 2 (two) times daily. 11/21/14   Jettie Booze, MD  Multiple Vitamin (MULTIVITAMIN WITH MINERALS) TABS Take 1 tablet by mouth daily.    Historical Provider, MD  nystatin ointment (MYCOSTATIN) Apply 1 application topically daily as needed. (yeast infection) 09/18/14   Historical Provider, MD  ranitidine (ZANTAC) 150 MG tablet Take 150 mg by mouth 2 (two) times daily.    Historical Provider, MD  timolol (BETIMOL) 0.5 % ophthalmic solution Place 1 drop into both eyes every morning.     Historical  Provider, MD   BP 116/61 mmHg  Pulse 120  Temp(Src) 100.1 F (37.8 C) (Oral)  Resp 22  Wt 214 lb (97.07 kg)  SpO2 94%  LMP 01/19/1992 Physical Exam  Constitutional: She appears well-developed and well-nourished.  HENT:  Head: Normocephalic and atraumatic.  Mouth/Throat: Oropharynx is clear and moist.  Eyes: EOM are normal. Pupils are equal, round, and reactive to light.  Neck: Normal range of motion. Neck supple.  Cardiovascular: Normal rate, regular rhythm and normal heart sounds.   Pulmonary/Chest: Effort normal and breath sounds normal.  Abdominal: Soft. Bowel sounds are normal. She exhibits no distension and no mass. There is no tenderness. There is no rebound and no guarding.  Musculoskeletal: Normal range of motion.  Neurological: She is alert. She has normal strength. No cranial nerve deficit or sensory deficit.  Skin: Skin is warm and dry.  Psychiatric: She has a normal mood and affect.  Nursing note and vitals reviewed.   ED Course  Procedures (including critical care time) Labs Review Labs Reviewed  CULTURE, BLOOD (ROUTINE X 2)  CULTURE, BLOOD (ROUTINE X 2)  URINE CULTURE  COMPREHENSIVE METABOLIC PANEL  CBC WITH DIFFERENTIAL/PLATELET  URINALYSIS, ROUTINE W REFLEX MICROSCOPIC (NOT AT [facility])  I-STAT CG4 LACTIC ACID, ED    Imaging Review Dg Chest 2 View  11/22/2014   CLINICAL DATA:  Confusion, fever, and shortness of breath for a few hr, onset of fever and weakness last night, recently discharge from hospital for sepsis related to kidney stone, tachycardia, ureteral stent placement yesterday, history hypertension, CHF, atrial fibrillation, MI, GERD  EXAM: CHEST  2 VIEW  COMPARISON:  10/29/2014  FINDINGS: Upper normal size of cardiac silhouette.  Large hiatal hernia.  Slight pulmonary vascular congestion.  Mediastinal contours otherwise normal.  Minimal bibasilar atelectasis.  No acute infiltrate, pleural effusion or pneumothorax.  Bones unremarkable.  IMPRESSION:  Minimal bibasilar atelectasis.  Large hiatal hernia.   Electronically Signed   By: Lavonia Dana M.D.   On: 11/22/2014 20:23   Dg Abd 1 View  11/22/2014   CLINICAL DATA:  Fever. Altered mental status. Ureteral stent placement yesterday.  EXAM: ABDOMEN - 1 VIEW  COMPARISON:  Radiographs 11/13/2014.  CT 11/10/2014  FINDINGS: Right ureteral stent in place, proximal and distal coils in the expected location. The renal calculi on prior CT are not well seen radiographically. There are no dilated bowel loops to suggest obstruction. No free intra-abdominal air.  IMPRESSION: Right ureteral stent in place, in expected location. The renal calculi on prior CT are not well seen radiographically.   Electronically Signed   By: Jeb Levering M.D.   On: 11/22/2014 22:31   US Renal  11/23/2014   CLINICAL DATA:  Acute on chronic renal failure, history hypertension, atrial fibrillation, MI, CHF, kidney stones  EXAM: RENAL / URINARY TRACT ULTRASOUND COMPLETE  COMPARISON:  11/13/2014  FINDINGS: Right Kidney:  Length: 13.6 cm. Normal cortical thickness and echogenicity. Persistent mild RIGHT hydronephrosis. No mass or shadowing calcification.  Left Kidney:  Length: 9.8 cm. Diffuse cortical thinning. Normal cortical echogenicity. Lobulated renal contour. Mild LEFT hydronephrosis similar to previous exam. No mass or shadowing calcification.  Bladder:  Appears normal for degree of bladder distention.  IMPRESSION: Persistent mild BILATERAL hydronephrosis unchanged.  Cortical thinning LEFT kidney.   Electronically Signed   By: Lavonia Dana M.D.   On: 11/23/2014 11:08     EKG Interpretation None      CRITICAL CARE Performed by: Hyman Bible   Total critical care time: 30  Critical care time was exclusive of separately billable procedures and treating other patients.  Critical care was necessary to treat or prevent imminent or life-threatening deterioration.  Critical care was time spent personally by me on the following  activities: development of treatment plan with patient and/or surrogate as well as nursing, discussions with consultants, evaluation of patient's response to treatment, examination of patient, obtaining history from patient or surrogate, ordering and performing treatments and interventions, ordering and review of laboratory studies, ordering and review of radiographic studies, pulse oximetry and  re-evaluation of patient's condition.    9:20 PM Discussed with Dr. Gaynelle Arabian with Urology.  He recommends getting a KUB and admission to medicine.  He will be available for consult.  Dis   MDM   Final diagnoses:  Fever   Patient presents today with fever and AMS.  She was recently admitted for Sepsis secondary to Pyelonephritis and discharged on 11/17/14.  She had Lithotripsy with right stent placement done by Urology to treat a kidney stone yesterday.  Patient found to be tachycardic with a temp of 100.3 F upon arrival in the ED.  Sepsis protocol initiated.  Patient given 30 cc/kg of IVF and started on broad spectrum Vancomycin and Zosyn.  CXR is negative for acute infection.  UA showing evidence of infection.  Urine cultured.  Blood cultures pending.  Patient found to marked leukocytosis with a WBC count of 32, 000.  Creatine is 4.8 today, which is up from her most recent Creatine of 2.8. . KUB ordered and verified that the ureteral stent is in place.  Urology consulted and evaluated the patient in the ED.  Patient admitted to [facility] for further management and treatment.      Hyman Bible, PA-C 11/24/14 1115  Elnora Morrison, MD 11/29/14 [phone]

## 2014-11-22 NOTE — ED Notes (Signed)
Pt requesting medication for indigestion. She reports she usually takes zantac. Will notify PA.

## 2014-11-23 ENCOUNTER — Inpatient Hospital Stay (HOSPITAL_COMMUNITY): Payer: Medicare Other

## 2014-11-23 ENCOUNTER — Encounter (HOSPITAL_COMMUNITY): Payer: Self-pay | Admitting: Internal Medicine

## 2014-11-23 DIAGNOSIS — Z9841 Cataract extraction status, right eye: Secondary | ICD-10-CM | POA: Diagnosis not present

## 2014-11-23 DIAGNOSIS — N179 Acute kidney failure, unspecified: Secondary | ICD-10-CM | POA: Diagnosis not present

## 2014-11-23 DIAGNOSIS — B373 Candidiasis of vulva and vagina: Secondary | ICD-10-CM | POA: Diagnosis present

## 2014-11-23 DIAGNOSIS — Z9842 Cataract extraction status, left eye: Secondary | ICD-10-CM | POA: Diagnosis not present

## 2014-11-23 DIAGNOSIS — N3001 Acute cystitis with hematuria: Secondary | ICD-10-CM | POA: Diagnosis present

## 2014-11-23 DIAGNOSIS — I10 Essential (primary) hypertension: Secondary | ICD-10-CM

## 2014-11-23 DIAGNOSIS — N202 Calculus of kidney with calculus of ureter: Secondary | ICD-10-CM | POA: Diagnosis present

## 2014-11-23 DIAGNOSIS — Z885 Allergy status to narcotic agent status: Secondary | ICD-10-CM | POA: Diagnosis not present

## 2014-11-23 DIAGNOSIS — Z87440 Personal history of urinary (tract) infections: Secondary | ICD-10-CM | POA: Diagnosis not present

## 2014-11-23 DIAGNOSIS — E669 Obesity, unspecified: Secondary | ICD-10-CM | POA: Diagnosis present

## 2014-11-23 DIAGNOSIS — N183 Chronic kidney disease, stage 3 (moderate): Secondary | ICD-10-CM | POA: Diagnosis present

## 2014-11-23 DIAGNOSIS — K219 Gastro-esophageal reflux disease without esophagitis: Secondary | ICD-10-CM | POA: Diagnosis present

## 2014-11-23 DIAGNOSIS — I480 Paroxysmal atrial fibrillation: Secondary | ICD-10-CM | POA: Diagnosis not present

## 2014-11-23 DIAGNOSIS — N12 Tubulo-interstitial nephritis, not specified as acute or chronic: Secondary | ICD-10-CM

## 2014-11-23 DIAGNOSIS — H409 Unspecified glaucoma: Secondary | ICD-10-CM | POA: Diagnosis present

## 2014-11-23 DIAGNOSIS — A419 Sepsis, unspecified organism: Principal | ICD-10-CM

## 2014-11-23 DIAGNOSIS — G43909 Migraine, unspecified, not intractable, without status migrainosus: Secondary | ICD-10-CM | POA: Diagnosis present

## 2014-11-23 DIAGNOSIS — I252 Old myocardial infarction: Secondary | ICD-10-CM | POA: Diagnosis not present

## 2014-11-23 DIAGNOSIS — I129 Hypertensive chronic kidney disease with stage 1 through stage 4 chronic kidney disease, or unspecified chronic kidney disease: Secondary | ICD-10-CM | POA: Diagnosis present

## 2014-11-23 DIAGNOSIS — Z961 Presence of intraocular lens: Secondary | ICD-10-CM | POA: Diagnosis present

## 2014-11-23 DIAGNOSIS — Z9889 Other specified postprocedural states: Secondary | ICD-10-CM | POA: Diagnosis not present

## 2014-11-23 DIAGNOSIS — D649 Anemia, unspecified: Secondary | ICD-10-CM | POA: Diagnosis present

## 2014-11-23 DIAGNOSIS — N189 Chronic kidney disease, unspecified: Secondary | ICD-10-CM

## 2014-11-23 DIAGNOSIS — Z90710 Acquired absence of both cervix and uterus: Secondary | ICD-10-CM | POA: Diagnosis not present

## 2014-11-23 DIAGNOSIS — E785 Hyperlipidemia, unspecified: Secondary | ICD-10-CM | POA: Diagnosis present

## 2014-11-23 DIAGNOSIS — Z87442 Personal history of urinary calculi: Secondary | ICD-10-CM | POA: Diagnosis not present

## 2014-11-23 DIAGNOSIS — N10 Acute tubulo-interstitial nephritis: Secondary | ICD-10-CM | POA: Diagnosis present

## 2014-11-23 DIAGNOSIS — R509 Fever, unspecified: Secondary | ICD-10-CM | POA: Diagnosis present

## 2014-11-23 DIAGNOSIS — E860 Dehydration: Secondary | ICD-10-CM | POA: Diagnosis present

## 2014-11-23 DIAGNOSIS — A047 Enterocolitis due to Clostridium difficile: Secondary | ICD-10-CM | POA: Diagnosis not present

## 2014-11-23 DIAGNOSIS — D631 Anemia in chronic kidney disease: Secondary | ICD-10-CM | POA: Diagnosis present

## 2014-11-23 DIAGNOSIS — Z7982 Long term (current) use of aspirin: Secondary | ICD-10-CM | POA: Diagnosis not present

## 2014-11-23 DIAGNOSIS — N289 Disorder of kidney and ureter, unspecified: Secondary | ICD-10-CM | POA: Diagnosis present

## 2014-11-23 DIAGNOSIS — Z79899 Other long term (current) drug therapy: Secondary | ICD-10-CM | POA: Diagnosis not present

## 2014-11-23 DIAGNOSIS — N200 Calculus of kidney: Secondary | ICD-10-CM

## 2014-11-23 LAB — BASIC METABOLIC PANEL
Anion gap: 9 (ref 5–15)
BUN: 46 mg/dL — ABNORMAL HIGH (ref 6–20)
CO2: 18 mmol/L — ABNORMAL LOW (ref 22–32)
Calcium: 8.1 mg/dL — ABNORMAL LOW (ref 8.9–10.3)
Chloride: 108 mmol/L (ref 101–111)
Creatinine, Ser: 4.68 mg/dL — ABNORMAL HIGH (ref 0.44–1.00)
GFR calc Af Amer: 10 mL/min — ABNORMAL LOW (ref >60)
GFR calc non Af Amer: 9 mL/min — ABNORMAL LOW (ref >60)
Glucose, Bld: 119 mg/dL — ABNORMAL HIGH (ref 65–99)
Potassium: 4.4 mmol/L (ref 3.5–5.1)
Sodium: 135 mmol/L (ref 135–145)

## 2014-11-23 LAB — COMPREHENSIVE METABOLIC PANEL
ALT: 20 U/L (ref 14–54)
AST: 16 U/L (ref 15–41)
Albumin: 1.9 g/dL — ABNORMAL LOW (ref 3.5–5.0)
Alkaline Phosphatase: 133 U/L — ABNORMAL HIGH (ref 38–126)
Anion gap: 10 (ref 5–15)
BUN: 45 mg/dL — ABNORMAL HIGH (ref 6–20)
CO2: 17 mmol/L — ABNORMAL LOW (ref 22–32)
Calcium: 8.1 mg/dL — ABNORMAL LOW (ref 8.9–10.3)
Chloride: 107 mmol/L (ref 101–111)
Creatinine, Ser: 4.64 mg/dL — ABNORMAL HIGH (ref 0.44–1.00)
GFR calc Af Amer: 10 mL/min — ABNORMAL LOW (ref >60)
GFR calc non Af Amer: 9 mL/min — ABNORMAL LOW (ref >60)
Glucose, Bld: 120 mg/dL — ABNORMAL HIGH (ref 65–99)
Potassium: 4.4 mmol/L (ref 3.5–5.1)
Sodium: 134 mmol/L — ABNORMAL LOW (ref 135–145)
Total Bilirubin: 0.3 mg/dL (ref 0.3–1.2)
Total Protein: 5.4 g/dL — ABNORMAL LOW (ref 6.5–8.1)

## 2014-11-23 LAB — CBC
HCT: 23.8 % — ABNORMAL LOW (ref 36.0–46.0)
HCT: 24.1 % — ABNORMAL LOW (ref 36.0–46.0)
Hemoglobin: 7.8 g/dL — ABNORMAL LOW (ref 12.0–15.0)
Hemoglobin: 7.8 g/dL — ABNORMAL LOW (ref 12.0–15.0)
MCH: 27.6 pg (ref 26.0–34.0)
MCH: 27.9 pg (ref 26.0–34.0)
MCHC: 32.4 g/dL (ref 30.0–36.0)
MCHC: 32.8 g/dL (ref 30.0–36.0)
MCV: 85 fL (ref 78.0–100.0)
MCV: 85.2 fL (ref 78.0–100.0)
Platelets: 298 10*3/uL (ref 150–400)
Platelets: 302 10*3/uL (ref 150–400)
RBC: 2.8 MIL/uL — ABNORMAL LOW (ref 3.87–5.11)
RBC: 2.83 MIL/uL — ABNORMAL LOW (ref 3.87–5.11)
RDW: 16 % — ABNORMAL HIGH (ref 11.5–15.5)
RDW: 16.1 % — ABNORMAL HIGH (ref 11.5–15.5)
WBC: 26.7 10*3/uL — ABNORMAL HIGH (ref 4.0–10.5)
WBC: 27.1 10*3/uL — ABNORMAL HIGH (ref 4.0–10.5)

## 2014-11-23 LAB — CREATININE, URINE, RANDOM: Creatinine, Urine: 44.71 mg/dL

## 2014-11-23 LAB — TROPONIN I
Troponin I: 0.03 ng/mL (ref <0.031)
Troponin I: <0.03 ng/mL (ref <0.031)
Troponin I: <0.03 ng/mL (ref <0.031)

## 2014-11-23 LAB — PROTIME-INR
INR: 1.31 (ref 0.00–1.49)
Prothrombin Time: 16.4 seconds — ABNORMAL HIGH (ref 11.6–15.2)

## 2014-11-23 LAB — PHOSPHORUS: Phosphorus: 4.6 mg/dL (ref 2.5–4.6)

## 2014-11-23 LAB — URINE CULTURE
Colony Count: NO GROWTH
Culture: NO GROWTH

## 2014-11-23 LAB — MRSA PCR SCREENING: MRSA by PCR: NEGATIVE

## 2014-11-23 LAB — SODIUM, URINE, RANDOM: Sodium, Ur: 68 mmol/L

## 2014-11-23 LAB — TSH: TSH: 1.153 u[IU]/mL (ref 0.350–4.500)

## 2014-11-23 LAB — MAGNESIUM: Magnesium: 1.8 mg/dL (ref 1.7–2.4)

## 2014-11-23 MED ORDER — LATANOPROST 0.005 % OP SOLN
1.0000 [drp] | Freq: Every day | OPHTHALMIC | Status: DC
  Administered 2014-11-23 – 2014-11-26 (×4): 1 [drp] via OPHTHALMIC
  Filled 2014-11-23: qty 2.5

## 2014-11-23 MED ORDER — ENOXAPARIN SODIUM 30 MG/0.3ML ~~LOC~~ SOLN
30.0000 mg | SUBCUTANEOUS | Status: DC
  Administered 2014-11-23 – 2014-11-27 (×5): 30 mg via SUBCUTANEOUS
  Filled 2014-11-23 (×6): qty 0.3

## 2014-11-23 MED ORDER — SODIUM CHLORIDE 0.9 % IV SOLN
INTRAVENOUS | Status: AC
  Administered 2014-11-23: 04:00:00 via INTRAVENOUS

## 2014-11-23 MED ORDER — NYSTATIN 100000 UNIT/GM EX OINT
1.0000 "application " | TOPICAL_OINTMENT | Freq: Three times a day (TID) | CUTANEOUS | Status: DC
  Administered 2014-11-23 – 2014-11-27 (×13): 1 via TOPICAL
  Filled 2014-11-23 (×2): qty 15

## 2014-11-23 MED ORDER — SODIUM CHLORIDE 0.9 % IJ SOLN
3.0000 mL | Freq: Two times a day (BID) | INTRAMUSCULAR | Status: DC
Start: 2014-11-23 — End: 2014-11-27
  Administered 2014-11-23 – 2014-11-27 (×7): 3 mL via INTRAVENOUS

## 2014-11-23 MED ORDER — ACETAMINOPHEN 650 MG RE SUPP
650.0000 mg | Freq: Four times a day (QID) | RECTAL | Status: DC | PRN
Start: 2014-11-23 — End: 2014-11-27

## 2014-11-23 MED ORDER — ASPIRIN 81 MG PO CHEW
81.0000 mg | CHEWABLE_TABLET | Freq: Every day | ORAL | Status: DC
  Administered 2014-11-23 – 2014-11-27 (×5): 81 mg via ORAL
  Filled 2014-11-23 (×5): qty 1

## 2014-11-23 MED ORDER — MORPHINE SULFATE 2 MG/ML IJ SOLN: 2.0000 mg | INTRAMUSCULAR | Status: DC | PRN

## 2014-11-23 MED ORDER — ONDANSETRON HCL 4 MG PO TABS
4.0000 mg | ORAL_TABLET | Freq: Four times a day (QID) | ORAL | Status: DC | PRN
  Filled 2014-11-23: qty 1

## 2014-11-23 MED ORDER — CETYLPYRIDINIUM CHLORIDE 0.05 % MT LIQD
7.0000 mL | Freq: Two times a day (BID) | OROMUCOSAL | Status: DC
  Administered 2014-11-23 – 2014-11-27 (×9): 7 mL via OROMUCOSAL

## 2014-11-23 MED ORDER — FLUCONAZOLE 100 MG PO TABS
100.0000 mg | ORAL_TABLET | Freq: Every day | ORAL | Status: DC
  Administered 2014-11-23 – 2014-11-27 (×5): 100 mg via ORAL
  Filled 2014-11-23 (×5): qty 1

## 2014-11-23 MED ORDER — RANITIDINE HCL 150 MG/10ML PO SYRP
150.0000 mg | ORAL_SOLUTION | Freq: Once | ORAL | Status: AC
  Administered 2014-11-23: 150 mg via ORAL
  Filled 2014-11-23: qty 10

## 2014-11-23 MED ORDER — PIPERACILLIN-TAZOBACTAM IN DEX 2-0.25 GM/50ML IV SOLN
2.2500 g | Freq: Four times a day (QID) | INTRAVENOUS | Status: DC
  Administered 2014-11-23 – 2014-11-25 (×9): 2.25 g via INTRAVENOUS
  Filled 2014-11-23 (×11): qty 50

## 2014-11-23 MED ORDER — FAMOTIDINE 20 MG PO TABS
10.0000 mg | ORAL_TABLET | Freq: Every day | ORAL | Status: DC
  Administered 2014-11-23: 10 mg via ORAL
  Filled 2014-11-23: qty 1

## 2014-11-23 MED ORDER — ONDANSETRON HCL 4 MG/2ML IJ SOLN
4.0000 mg | Freq: Four times a day (QID) | INTRAMUSCULAR | Status: DC | PRN
  Administered 2014-11-27: 4 mg via INTRAVENOUS
  Filled 2014-11-23: qty 2

## 2014-11-23 MED ORDER — TIMOLOL MALEATE 0.5 % OP SOLN
1.0000 [drp] | Freq: Every morning | OPHTHALMIC | Status: DC
  Administered 2014-11-23 – 2014-11-27 (×5): 1 [drp] via OPHTHALMIC
  Filled 2014-11-23: qty 5

## 2014-11-23 MED ORDER — ACETAMINOPHEN 325 MG PO TABS
650.0000 mg | ORAL_TABLET | Freq: Four times a day (QID) | ORAL | Status: DC | PRN
  Administered 2014-11-27: 650 mg via ORAL
  Filled 2014-11-23: qty 2

## 2014-11-23 NOTE — Progress Notes (Signed)
Brief Rx antibiotic note:  Zosyn  See full note from 6/4 J. [place]ford for full details  Plan  Zosyn 3.375 Gm x1 then 2.25 Gm IV q6h  F/u Scr/cultures as needed  Dorrene German 11/23/2014 2:29 AM

## 2014-11-23 NOTE — Progress Notes (Signed)
Subjective: The patient reports more mental alertness. JJ stent in good position. On Diflucan for yeast and continues antibiotic coverage for urosepsis.   Objective: Vital signs in last 24 hours: Temp:  [97.6 F (36.4 C)-100.1 F (37.8 C)] 97.8 F (36.6 C) (06/05 0800) Pulse Rate:  [57-120] 74 (06/05 1200) Resp:  [13-25] 21 (06/05 1200) BP: (90-126)/(42-73) 126/55 mmHg (06/05 1200) SpO2:  [91 %-100 %] 100 % (06/05 1200) Weight:  [97.07 kg (214 lb)-97.2 kg (214 lb 4.6 oz)] 97.2 kg (214 lb 4.6 oz) (06/05 0200)A  Intake/Output from previous day: 06/04 0701 - 06/05 0700 In: 215 [I.V.:165; IV Piggyback:50] Out: 400 [Urine:400] Intake/Output this shift: Total I/O In: 100 [I.V.:100] Out: -   Past Medical History  Diagnosis Date  . Hypertension   . GERD (gastroesophageal reflux disease)   . Glaucoma   . H/O renal calculi 2002 & 2006  . H/O hiatal hernia   . Headache(784.0)     migraine  . Pneumonia     dx 10-06-2014 per CXR--  on 10-27-2014 pt states finished antibiotic and denies cough or fever  . Hyperlipidemia   . History of MI (myocardial infarction)     10/ 2013 in setting of Septic Shock  . History of atrial fibrillation     10/ 2013  in setting of Septic Shock  . History of CHF (congestive heart failure)     10/ 2013 in setting of septic shock  . Sigmoid diverticulosis   . Nephrolithiasis     bilateral  . CKD (chronic kidney disease), stage III     nephrologist-  dr Florene Glen  . Right ureteral stone   . History of acute respiratory failure     10/ 2013  -- ventilated in setting septic shock  . Dysrhythmia     Afib in 2013 when she had septic shock related to stone ureteral obstruction  . Myocardial infarction     Was reported in 2013 during hospitalization with septic shock  . Arthritis     knee's  . Foley catheter in place   . Septic shock 04/04/2012    Physical Exam:  Lungs - Normal respiratory effort, chest expands symmetrically.  Abdomen - Soft,  non-tender & non-distended.  Lab Results:  Recent Labs  11/22/14 1945 11/23/14 0305  WBC 32.2* 26.7*  27.1*  HGB 8.7* 7.8*  7.8*  HCT 26.0* 23.8*  24.1*   BMET  Recent Labs  11/22/14 1945 11/23/14 0305  NA 129* 135  134*  K 4.0 4.4  4.4  CL 101 108  107  CO2 15* 18*  17*  GLUCOSE 113* 119*  120*  BUN 49* 46*  45*  CREATININE 4.85* 4.68*  4.64*  CALCIUM 8.7* 8.1*  8.1*   No results for input(s): LABURIN in the last 72 hours. Results for orders placed or performed during the hospital encounter of 11/22/14  MRSA PCR Screening     Status: None   Collection Time: 11/23/14  2:56 AM  Result Value Ref Range Status   MRSA by PCR NEGATIVE NEGATIVE Final    Comment:        The GeneXpert MRSA Assay (FDA approved for NASAL specimens only), is one component of a comprehensive MRSA colonization surveillance program. It is not intended to diagnose MRSA infection nor to guide or monitor treatment for MRSA infections.     Studies/Results: Dg Chest 2 View  11/22/2014   CLINICAL DATA:  Confusion, fever, and shortness of breath for a few hr,  onset of fever and weakness last night, recently discharge from hospital for sepsis related to kidney stone, tachycardia, ureteral stent placement yesterday, history hypertension, CHF, atrial fibrillation, MI, GERD  EXAM: CHEST  2 VIEW  COMPARISON:  10/29/2014  FINDINGS: Upper normal size of cardiac silhouette.  Large hiatal hernia.  Slight pulmonary vascular congestion.  Mediastinal contours otherwise normal.  Minimal bibasilar atelectasis.  No acute infiltrate, pleural effusion or pneumothorax.  Bones unremarkable.  IMPRESSION: Minimal bibasilar atelectasis.  Large hiatal hernia.   Electronically Signed   By: Lavonia Dana M.D.   On: 11/22/2014 20:23   Dg Abd 1 View  11/22/2014   CLINICAL DATA:  Fever. Altered mental status. Ureteral stent placement yesterday.  EXAM: ABDOMEN - 1 VIEW  COMPARISON:  Radiographs 11/13/2014.  CT 11/10/2014   FINDINGS: Right ureteral stent in place, proximal and distal coils in the expected location. The renal calculi on prior CT are not well seen radiographically. There are no dilated bowel loops to suggest obstruction. No free intra-abdominal air.  IMPRESSION: Right ureteral stent in place, in expected location. The renal calculi on prior CT are not well seen radiographically.   Electronically Signed   By: Jeb Levering M.D.   On: 11/22/2014 22:31   US Renal  11/23/2014   CLINICAL DATA:  Acute on chronic renal failure, history hypertension, atrial fibrillation, MI, CHF, kidney stones  EXAM: RENAL / URINARY TRACT ULTRASOUND COMPLETE  COMPARISON:  11/13/2014  FINDINGS: Right Kidney:  Length: 13.6 cm. Normal cortical thickness and echogenicity. Persistent mild RIGHT hydronephrosis. No mass or shadowing calcification.  Left Kidney:  Length: 9.8 cm. Diffuse cortical thinning. Normal cortical echogenicity. Lobulated renal contour. Mild LEFT hydronephrosis similar to previous exam. No mass or shadowing calcification.  Bladder:  Appears normal for degree of bladder distention.  IMPRESSION: Persistent mild BILATERAL hydronephrosis unchanged.  Cortical thinning LEFT kidney.   Electronically Signed   By: Lavonia Dana M.D.   On: 11/23/2014 11:08    Assessment: Stabilizing. She has had cardiac echo this AM.  Plan: Follow.  Anayi Bricco I Xavi Tomasik 11/23/2014, 12:30 PM

## 2014-11-23 NOTE — Progress Notes (Signed)
Progress Note   [name] [id number] DOB: 1946/06/16 DOA: 11/22/2014 PCP: [name], MD   Brief Narrative:   [name] is an 69 y.o. female with a PMH of hypertension, renal calculi, stage III chronic kidney disease, whose history of present illness dates back to May, when she presented to the ED with right flank pain and a CT scan at that time showed a 4.5 mm right proximal ureteral stone with associated hydronephrosis. A JJ stent was placed. She subsequently had an admission for treatment of sepsis secondary to pyelonephritis and recurrent obstructing kidney stone with hospitalization 11/12/14-11/17/14. She was discharged home on a course of Ceftin with urology follow-up. On 11/21/14, the patient underwent cystoscopy, right ureteroscopy and stone removal with laser lithotripsy and right ureteral stent placement. She then presented 11/22/14 with fever, leukocytosis, and acute renal failure. Urology has been consulted.  Assessment/Plan:   Principal Problem:   Sepsis secondary to pyelonephritis in the setting of nephrolithiasis - Likely from recurrent pyelonephritis. - Follow-up blood and urine cultures. Most recent cultures grew yeast. Continue Diflucan. - Continue vancomycin and Zosyn while awaiting culture results. - Urology following.  Active Problems:   AKI (acute kidney injury) in the setting of stage III chronic kidney disease - Baseline creatinine is around 1.9. Current creatinine elevated over usual baseline values. - Concerning for obstructive process.  - Repeat renal ultrasound pending. - Continue to hydrate.    Essential hypertension, benign - Blood pressure soft. Norvasc and Lopressor currently on hold.    Atrial fibrillation - On aspirin. Rate controlled. - ChadsVasc2 score is 3, being followed by cardiologist as an outpatient. - 2-D echo requested.    Normocytic anemia  - Likely anemia of chronic kidney disease. Monitor hemoglobin.    DVT  Prophylaxis - Continue Lovenox.  Code Status: Full. Family Communication: Daughter, [name], updated at bedside. Disposition Plan: Tx to floor, await cultures, can D/C home once cultures back and renal function improved.   IV Access:    Peripheral IV   Procedures and diagnostic studies:   Dg Chest 2 View  11/22/2014   CLINICAL DATA:  Confusion, fever, and shortness of breath for a few hr, onset of fever and weakness last night, recently discharge from hospital for sepsis related to kidney stone, tachycardia, ureteral stent placement yesterday, history hypertension, CHF, atrial fibrillation, MI, GERD  EXAM: CHEST  2 VIEW  COMPARISON:  10/29/2014  FINDINGS: Upper normal size of cardiac silhouette.  Large hiatal hernia.  Slight pulmonary vascular congestion.  Mediastinal contours otherwise normal.  Minimal bibasilar atelectasis.  No acute infiltrate, pleural effusion or pneumothorax.  Bones unremarkable.  IMPRESSION: Minimal bibasilar atelectasis.  Large hiatal hernia.   Electronically Signed   By: Lavonia Dana M.D.   On: 11/22/2014 20:23   Dg Abd 1 View  11/22/2014   CLINICAL DATA:  Fever. Altered mental status. Ureteral stent placement yesterday.  EXAM: ABDOMEN - 1 VIEW  COMPARISON:  Radiographs 11/13/2014.  CT 11/10/2014  FINDINGS: Right ureteral stent in place, proximal and distal coils in the expected location. The renal calculi on prior CT are not well seen radiographically. There are no dilated bowel loops to suggest obstruction. No free intra-abdominal air.  IMPRESSION: Right ureteral stent in place, in expected location. The renal calculi on prior CT are not well seen radiographically.   Electronically Signed   By: Jeb Levering M.D.   On: 11/22/2014 22:31     Medical Consultants:    Dr.  Gaynelle Arabian, Urology  Anti-Infectives:    Vancomycin 11/22/14--->  Zosyn 11/22/14--->  Subjective:   [name] denies pain, nausea, dyspnea.  Appetite fair.  Diarrhea yesterday, none  today.  Objective:    Filed Vitals:   11/23/14 0300 11/23/14 0400 11/23/14 0500 11/23/14 0600  BP: 101/49 95/42 103/42 90/47  Pulse: 73 64 62 57  Temp:  98.2 F (36.8 C)    TempSrc:  Oral    Resp: 24 16 15 13   Height:      Weight:      SpO2: 98% 96% 96% 97%    Intake/Output Summary (Last 24 hours) at 11/23/14 0731 Last data filed at 11/23/14 0600  Gross per 24 hour  Intake    165 ml  Output      0 ml  Net    165 ml    Exam: Gen:  Non-toxic appearing Cardiovascular:  RRR, No M/R/G Respiratory:  Lungs CTAB Gastrointestinal:  Abdomen soft, NT/ND, + BS Extremities:  2+ edema   Data Reviewed:    Labs: Basic Metabolic Panel:  Recent Labs Lab 11/17/14 0019 11/22/14 1945 11/23/14 0305  NA 134* 129* 135  134*  K 4.2 4.0 4.4  4.4  CL 108 101 108  107  CO2 18* 15* 18*  17*  GLUCOSE 106* 113* 119*  120*  BUN 29* 49* 46*  45*  CREATININE 2.80* 4.85* 4.68*  4.64*  CALCIUM 7.9* 8.7* 8.1*  8.1*  MG  --   --  1.8  PHOS  --   --  4.6   GFR Estimated Creatinine Clearance: 12.5 mL/min (by C-G formula based on Cr of 4.64). Liver Function Tests:  Recent Labs Lab 11/22/14 1945 11/23/14 0305  AST 22 16  ALT 27 20  ALKPHOS 166* 133*  BILITOT 0.5 0.3  PROT 6.4* 5.4*  ALBUMIN 2.4* 1.9*   Coagulation profile  Recent Labs Lab 11/23/14 0305  INR 1.31    CBC:  Recent Labs Lab 11/17/14 0019 11/22/14 1945 11/23/14 0305  WBC 8.9 32.2* 26.7*  27.1*  NEUTROABS  --  29.9*  --   HGB 9.5* 8.7* 7.8*  7.8*  HCT 28.9* 26.0* 23.8*  24.1*  MCV 85.5 85.8 85.0  85.2  PLT 209 380 298  302   Cardiac Enzymes:  Recent Labs Lab 11/23/14 0305  TROPONINI 0.03   Thyroid function studies:  Recent Labs  11/23/14 0305  TSH 1.153   Sepsis Labs:  Recent Labs Lab 11/17/14 0019 11/22/14 1945 11/22/14 2002 11/23/14 0305  WBC 8.9 32.2*  --  26.7*  27.1*  LATICACIDVEN  --   --  0.80  --    Microbiology Recent Results (from the past 240 hour(s))    Urine culture     Status: None   Collection Time: 11/21/14 12:23 PM  Result Value Ref Range Status   Specimen Description URINE, RANDOM CYSTOSCOPE URINE  Final   Special Requests PATIENT ON FOLLOWING ANCEF 2GM, CEFTIN 500MG   Final   Colony Count   Final    80,000 COLONIES/ML Performed at [place] Performed at [facility]   Final   Report Status 11/22/2014 FINAL  Final  MRSA PCR Screening     Status: None   Collection Time: 11/23/14  2:56 AM  Result Value Ref Range Status   MRSA by PCR NEGATIVE NEGATIVE Final    Comment:        The GeneXpert MRSA Assay (FDA approved for  NASAL specimens only), is one component of a comprehensive MRSA colonization surveillance program. It is not intended to diagnose MRSA infection nor to guide or monitor treatment for MRSA infections.      Medications:   . antiseptic oral rinse  7 mL Mouth Rinse BID  . aspirin  81 mg Oral Daily  . enoxaparin (LOVENOX) injection  30 mg Subcutaneous Q24H  . famotidine  10 mg Oral Daily  . fluconazole  100 mg Oral Daily  . latanoprost  1 drop Both Eyes QHS  . nystatin ointment  1 application Topical TID  . piperacillin-tazobactam (ZOSYN)  IV  2.25 g Intravenous Q6H  . piperacillin-tazobactam (ZOSYN)  IV  3.375 g Intravenous Once  . sodium chloride  3 mL Intravenous Q12H  . timolol  1 drop Both Eyes q morning - 10a  . vancomycin  1,000 mg Intravenous Q48H   Continuous Infusions: . sodium chloride 50 mL/hr at 11/23/14 0342    Time spent: 35 minutes with > 50% of time discussing current diagnostic test results, clinical impression and plan of care.   LOS: 0 days   Chistian Kasler  [facility] Pager [phone]. If unable to reach me by pager, please call my cell phone at [phone].  *Please refer to [email or web address], password [email or web address] to get updated schedule on who will round on this patient, as hospitalists switch teams weekly. If 7PM-7AM, please contact night-coverage  at [email or web address], password [email or web address] for any overnight needs.  11/23/2014, 7:31 AM

## 2014-11-23 NOTE — Progress Notes (Signed)
  Echocardiogram 2D Echocardiogram has been performed.  Lysle Rubens 11/23/2014, 12:08 PM

## 2014-11-24 ENCOUNTER — Encounter (HOSPITAL_BASED_OUTPATIENT_CLINIC_OR_DEPARTMENT_OTHER): Payer: Self-pay | Admitting: Urology

## 2014-11-24 DIAGNOSIS — N183 Chronic kidney disease, stage 3 (moderate): Secondary | ICD-10-CM

## 2014-11-24 LAB — CBC
HCT: 25.3 % — ABNORMAL LOW (ref 36.0–46.0)
Hemoglobin: 8.2 g/dL — ABNORMAL LOW (ref 12.0–15.0)
MCH: 28.1 pg (ref 26.0–34.0)
MCHC: 32.4 g/dL (ref 30.0–36.0)
MCV: 86.6 fL (ref 78.0–100.0)
Platelets: 306 10*3/uL (ref 150–400)
RBC: 2.92 MIL/uL — ABNORMAL LOW (ref 3.87–5.11)
RDW: 16.4 % — ABNORMAL HIGH (ref 11.5–15.5)
WBC: 13.6 10*3/uL — ABNORMAL HIGH (ref 4.0–10.5)

## 2014-11-24 LAB — URINE CULTURE
Colony Count: NO GROWTH
Culture: NO GROWTH

## 2014-11-24 LAB — BASIC METABOLIC PANEL
Anion gap: 10 (ref 5–15)
BUN: 51 mg/dL — ABNORMAL HIGH (ref 6–20)
CO2: 18 mmol/L — ABNORMAL LOW (ref 22–32)
Calcium: 8.5 mg/dL — ABNORMAL LOW (ref 8.9–10.3)
Chloride: 110 mmol/L (ref 101–111)
Creatinine, Ser: 4.59 mg/dL — ABNORMAL HIGH (ref 0.44–1.00)
GFR calc Af Amer: 10 mL/min — ABNORMAL LOW (ref >60)
GFR calc non Af Amer: 9 mL/min — ABNORMAL LOW (ref >60)
Glucose, Bld: 105 mg/dL — ABNORMAL HIGH (ref 65–99)
Potassium: 4.4 mmol/L (ref 3.5–5.1)
Sodium: 138 mmol/L (ref 135–145)

## 2014-11-24 LAB — VANCOMYCIN, TROUGH: Vancomycin Tr: 21 ug/mL — ABNORMAL HIGH (ref 10.0–20.0)

## 2014-11-24 MED ORDER — FAMOTIDINE 20 MG PO TABS
20.0000 mg | ORAL_TABLET | Freq: Two times a day (BID) | ORAL | Status: DC
  Administered 2014-11-24 – 2014-11-27 (×7): 20 mg via ORAL
  Filled 2014-11-24 (×7): qty 1

## 2014-11-24 NOTE — Progress Notes (Signed)
Progress Note   [name] [id number] DOB: 28-Dec-1945 DOA: 11/22/2014 PCP: [name], MD   Brief Narrative:   [name] is an 69 y.o. female with a PMH of hypertension, renal calculi, stage III chronic kidney disease, whose history of present illness dates back to May, when she presented to the ED with right flank pain and a CT scan at that time showed a 4.5 mm right proximal ureteral stone with associated hydronephrosis. A JJ stent was placed. She subsequently had an admission for treatment of sepsis secondary to pyelonephritis and recurrent obstructing kidney stone with hospitalization 11/12/14-11/17/14. She was discharged home on a course of Ceftin with urology follow-up. On 11/21/14, the patient underwent cystoscopy, right ureteroscopy and stone removal with laser lithotripsy and right ureteral stent placement. She then presented 11/22/14 with fever, leukocytosis, and acute renal failure. Urology following.  Assessment/Plan:   Principal Problem:   Sepsis secondary to pyelonephritis in the setting of nephrolithiasis - Likely from recurrent pyelonephritis. - Follow-up blood cultures. Repeat urine cultures negative. Most recent cultures grew yeast. Continue Diflucan. - Continue vancomycin and Zosyn while awaiting blood culture results. - Urology following.  Active Problems:   AKI (acute kidney injury) in the setting of stage III chronic kidney disease - Baseline creatinine is around 1.9. Current creatinine elevated over usual baseline values, no improvement. - Concerning for obstructive process.  - Repeat renal ultrasound shows no improvement. - Continue to hydrate.    Essential hypertension, benign - Blood pressure soft. Norvasc and Lopressor currently on hold.    Atrial fibrillation - On aspirin. Rate controlled. - ChadsVasc2 score is 3, being followed by cardiologist as an outpatient. - 2-D echo shows EF 65-70 percent, no regional wall motion abnormalities,  LVH.    Normocytic anemia  - Likely anemia of chronic kidney disease. Monitor hemoglobin.    DVT Prophylaxis - Continue Lovenox.  Code Status: Full. Family Communication: Daughter, [name], updated at bedside 11/23/14, no family present today. Disposition Plan: Tx to floor, await cultures, can D/C home once cultures back and renal function improved.   IV Access:    Peripheral IV   Procedures and diagnostic studies:   Dg Chest 2 View  11/22/2014   CLINICAL DATA:  Confusion, fever, and shortness of breath for a few hr, onset of fever and weakness last night, recently discharge from hospital for sepsis related to kidney stone, tachycardia, ureteral stent placement yesterday, history hypertension, CHF, atrial fibrillation, MI, GERD  EXAM: CHEST  2 VIEW  COMPARISON:  10/29/2014  FINDINGS: Upper normal size of cardiac silhouette.  Large hiatal hernia.  Slight pulmonary vascular congestion.  Mediastinal contours otherwise normal.  Minimal bibasilar atelectasis.  No acute infiltrate, pleural effusion or pneumothorax.  Bones unremarkable.  IMPRESSION: Minimal bibasilar atelectasis.  Large hiatal hernia.   Electronically Signed   By: Lavonia Dana M.D.   On: 11/22/2014 20:23   Dg Abd 1 View  11/22/2014   CLINICAL DATA:  Fever. Altered mental status. Ureteral stent placement yesterday.  EXAM: ABDOMEN - 1 VIEW  COMPARISON:  Radiographs 11/13/2014.  CT 11/10/2014  FINDINGS: Right ureteral stent in place, proximal and distal coils in the expected location. The renal calculi on prior CT are not well seen radiographically. There are no dilated bowel loops to suggest obstruction. No free intra-abdominal air.  IMPRESSION: Right ureteral stent in place, in expected location. The renal calculi on prior CT are not well seen radiographically.   Electronically Signed   By:  Jeb Levering M.D.   On: 11/22/2014 22:31   US Renal  11/23/2014   CLINICAL DATA:  Acute on chronic renal failure, history hypertension, atrial  fibrillation, MI, CHF, kidney stones  EXAM: RENAL / URINARY TRACT ULTRASOUND COMPLETE  COMPARISON:  11/13/2014  FINDINGS: Right Kidney:  Length: 13.6 cm. Normal cortical thickness and echogenicity. Persistent mild RIGHT hydronephrosis. No mass or shadowing calcification.  Left Kidney:  Length: 9.8 cm. Diffuse cortical thinning. Normal cortical echogenicity. Lobulated renal contour. Mild LEFT hydronephrosis similar to previous exam. No mass or shadowing calcification.  Bladder:  Appears normal for degree of bladder distention.  IMPRESSION: Persistent mild BILATERAL hydronephrosis unchanged.  Cortical thinning LEFT kidney.   Electronically Signed   By: Lavonia Dana M.D.   On: 11/23/2014 11:08     Medical Consultants:    Dr. Gaynelle Arabian, Urology  Anti-Infectives:    Vancomycin 11/22/14--->  Zosyn 11/22/14--->  Subjective:   [name] continue to deny pain, nausea, dyspnea.  Does get short of breath with movement/activity.  Appetite improving.  No diarrhea but did have a loose stool this morning.  Objective:    Filed Vitals:   11/23/14 2200 11/24/14 0000 11/24/14 0400 11/24/14 0800  BP: 133/64 138/65 127/66 135/62  Pulse: 74 71 71 77  Temp:  97.4 F (36.3 C) 98.1 F (36.7 C)   TempSrc:  Oral Oral   Resp: 23 16 17 16   Height:      Weight:   100.2 kg (220 lb 14.4 oz)   SpO2: 98%  98% 99%    Intake/Output Summary (Last 24 hours) at 11/24/14 0911 Last data filed at 11/24/14 0848  Gross per 24 hour  Intake    560 ml  Output      0 ml  Net    560 ml    Exam: Gen:  Non-toxic appearing Cardiovascular:  RRR, No M/R/G Respiratory:  Lungs CTAB Gastrointestinal:  Abdomen soft, NT/ND, + BS Extremities:  1+ edema   Data Reviewed:    Labs: Basic Metabolic Panel:  Recent Labs Lab 11/22/14 1945 11/23/14 0305 11/24/14 0340  NA 129* 135  134* 138  K 4.0 4.4  4.4 4.4  CL 101 108  107 110  CO2 15* 18*  17* 18*  GLUCOSE 113* 119*  120* 105*  BUN 49* 46*  45* 51*    CREATININE 4.85* 4.68*  4.64* 4.59*  CALCIUM 8.7* 8.1*  8.1* 8.5*  MG  --  1.8  --   PHOS  --  4.6  --    GFR Estimated Creatinine Clearance: 12.9 mL/min (by C-G formula based on Cr of 4.59). Liver Function Tests:  Recent Labs Lab 11/22/14 1945 11/23/14 0305  AST 22 16  ALT 27 20  ALKPHOS 166* 133*  BILITOT 0.5 0.3  PROT 6.4* 5.4*  ALBUMIN 2.4* 1.9*   Coagulation profile  Recent Labs Lab 11/23/14 0305  INR 1.31    CBC:  Recent Labs Lab 11/22/14 1945 11/23/14 0305 11/24/14 0340  WBC 32.2* 26.7*  27.1* 13.6*  NEUTROABS 29.9*  --   --   HGB 8.7* 7.8*  7.8* 8.2*  HCT 26.0* 23.8*  24.1* 25.3*  MCV 85.8 85.0  85.2 86.6  PLT 380 298  302 306   Cardiac Enzymes:  Recent Labs Lab 11/23/14 0305 11/23/14 0812 11/23/14 1419  TROPONINI 0.03 <0.03 <0.03   Thyroid function studies:  Recent Labs  11/23/14 0305  TSH 1.153   Sepsis Labs:  Recent Labs Lab  11/22/14 1945 11/22/14 2002 11/23/14 0305 11/24/14 0340  WBC 32.2*  --  26.7*  27.1* 13.6*  LATICACIDVEN  --  0.80  --   --    Microbiology Recent Results (from the past 240 hour(s))  Urine culture     Status: None   Collection Time: 11/21/14 12:23 PM  Result Value Ref Range Status   Specimen Description URINE, RANDOM CYSTOSCOPE URINE  Final   Special Requests PATIENT ON FOLLOWING ANCEF 2GM, CEFTIN 500MG   Final   Colony Count   Final    80,000 COLONIES/ML Performed at [place] Performed at [facility]   Final   Report Status 11/22/2014 FINAL  Final  Culture, blood (routine x 2)     Status: None (Preliminary result)   Collection Time: 11/22/14  7:45 PM  Result Value Ref Range Status   Specimen Description BLOOD RIGHT ARM  5 ML IN [facility] BOTTLE  Final   Special Requests NONE  Final   Culture   Final           BLOOD CULTURE RECEIVED NO GROWTH TO DATE CULTURE WILL BE HELD FOR 5 DAYS BEFORE ISSUING A FINAL NEGATIVE REPORT Performed at [facility]    Report Status PENDING  Incomplete  Urine culture     Status: None   Collection Time: 11/22/14  8:48 PM  Result Value Ref Range Status   Specimen Description URINE, CLEAN CATCH  Final   Special Requests NONE  Final   Colony Count NO GROWTH Performed at [facility]   Final   Culture NO GROWTH Performed at [facility]   Final   Report Status 11/23/2014 FINAL  Final  MRSA PCR Screening     Status: None   Collection Time: 11/23/14  2:56 AM  Result Value Ref Range Status   MRSA by PCR NEGATIVE NEGATIVE Final    Comment:        The GeneXpert MRSA Assay (FDA approved for NASAL specimens only), is one component of a comprehensive MRSA colonization surveillance program. It is not intended to diagnose MRSA infection nor to guide or monitor treatment for MRSA infections.      Medications:   . antiseptic oral rinse  7 mL Mouth Rinse BID  . aspirin  81 mg Oral Daily  . enoxaparin (LOVENOX) injection  30 mg Subcutaneous Q24H  . famotidine  10 mg Oral Daily  . fluconazole  100 mg Oral Daily  . latanoprost  1 drop Both Eyes QHS  . nystatin ointment  1 application Topical TID  . piperacillin-tazobactam (ZOSYN)  IV  2.25 g Intravenous Q6H  . piperacillin-tazobactam (ZOSYN)  IV  3.375 g Intravenous Once  . sodium chloride  3 mL Intravenous Q12H  . timolol  1 drop Both Eyes q morning - 10a  . vancomycin  1,000 mg Intravenous Q48H   Continuous Infusions:    Time spent: 35 minutes with > 50% of time discussing current diagnostic test results, clinical impression and plan of care.   LOS: 1 day   Taden Witter  [facility] Pager [phone]. If unable to reach me by pager, please call my cell phone at [phone].  *Please refer to [email or web address], password [email or web address] to get updated schedule on who will round on this patient, as hospitalists switch teams weekly. If 7PM-7AM, please contact night-coverage at [email or web address], password [email or web address] for any overnight  needs.  11/24/2014, 9:11 AM

## 2014-11-24 NOTE — Progress Notes (Signed)
Pt transferred to 1238 via wheelchair with all belongings. Report given and all questions answered. Patient contacted family with new room assignment.

## 2014-11-24 NOTE — Care Management Note (Signed)
Case Management Note  Patient Details  Name: [name] MRN: [id number] Date of Birth: 10-23-1945  Subjective/Objective:                 Pyleopheritis with failed outpt treatment   Action/Plan:   Expected Discharge Date:                  Expected Discharge Plan:  Home/Self Care  In-House Referral:  Clinical Social Work  Discharge planning Services  CM Consult  Post Acute Care Choice:  NA Choice offered to:  NA  DME Arranged:  N/A DME Agency:  NA  HH Arranged:  NA HH Agency:  NA  Status of Service:  In process, will continue to follow  Medicare Important Message Given:    Date Medicare IM Given:    Medicare IM give by:    Date Additional Medicare IM Given:    Additional Medicare Important Message give by:     If discussed at [place] of Stay Meetings, dates discussed:    Additional Comments:  Leeroy Cha, RN 11/24/2014, 11:33 AM

## 2014-11-24 NOTE — Progress Notes (Signed)
ANTIBIOTIC CONSULT NOTE - FOLLOW UP  Pharmacy Consult for Vancomycin Indication: rule out sepsis   Labs:  Recent Labs  11/22/14 1945 11/23/14 0305 11/23/14 0645 11/24/14 0340  WBC 32.2* 26.7*  27.1*  --  13.6*  HGB 8.7* 7.8*  7.8*  --  8.2*  PLT 380 298  302  --  306  LABCREA  --   --  44.71  --   CREATININE 4.85* 4.68*  4.64*  --  4.59*   Estimated Creatinine Clearance: 12.9 mL/min (by C-G formula based on Cr of 4.59).  Recent Labs  11/24/14 2201  VANCOTROUGH 21*    Assessment: 61 yoF admitted on 6/4 after recent discharge with fever and weakness.  She had cysto, laser lithotripsy w/ stone removal and ureteral stent placement 11/21/14.  Pharmacy is now consulted to dose Vancomycin and Zosyn for urosepsis.  PTA >> Fluconazole PO >>  6/4 >>zosyn >> 6/4 >>vancomycin >>   Today, 11/24/2014:  Afebrile  WBC: improved,  13.6  Renal: SCr remains elevated 4.59 (baseline ~ 1.9) with CrCl ~ 13 ml/min.  6/3 Urine cxt: 80,000 yeast.  6/4-6/5 Blood and urine cultures unrevealing  Vancomycin trough level = 21, slightly above goal range after Vanc 1g IV given 6/4 2200.  Goal of Therapy:  Vancomycin trough level 15-20 mcg/ml  Plan:   Continue to hold Vancomycin doses.  Recheck random vancomycin level with AM labs.  Follow up renal fxn, culture results, and clinical course.  Gretta Arab PharmD, BCPS Pager [phone] 11/24/2014 11:12 PM

## 2014-11-24 NOTE — Progress Notes (Signed)
Date:  November 24, 2014 U.R. performed for needs and level of care. Pyleonepheritis with confusion, hypotensive on admit,  Will continue to follow for Case Management needs.  Velva Harman, RN, BSN, [place]   [phone]

## 2014-11-25 DIAGNOSIS — A0472 Enterocolitis due to Clostridium difficile, not specified as recurrent: Secondary | ICD-10-CM | POA: Diagnosis present

## 2014-11-25 DIAGNOSIS — R197 Diarrhea, unspecified: Secondary | ICD-10-CM | POA: Diagnosis present

## 2014-11-25 DIAGNOSIS — N1330 Unspecified hydronephrosis: Secondary | ICD-10-CM

## 2014-11-25 LAB — BASIC METABOLIC PANEL
Anion gap: 9 (ref 5–15)
BUN: 46 mg/dL — ABNORMAL HIGH (ref 6–20)
CO2: 20 mmol/L — ABNORMAL LOW (ref 22–32)
Calcium: 8.8 mg/dL — ABNORMAL LOW (ref 8.9–10.3)
Chloride: 111 mmol/L (ref 101–111)
Creatinine, Ser: 4.08 mg/dL — ABNORMAL HIGH (ref 0.44–1.00)
GFR calc Af Amer: 12 mL/min — ABNORMAL LOW (ref >60)
GFR calc non Af Amer: 10 mL/min — ABNORMAL LOW (ref >60)
Glucose, Bld: 100 mg/dL — ABNORMAL HIGH (ref 65–99)
Potassium: 4.2 mmol/L (ref 3.5–5.1)
Sodium: 140 mmol/L (ref 135–145)

## 2014-11-25 LAB — CLOSTRIDIUM DIFFICILE BY PCR: Toxigenic C. Difficile by PCR: POSITIVE — AB

## 2014-11-25 LAB — VANCOMYCIN, RANDOM: Vancomycin Rm: 19 ug/mL

## 2014-11-25 MED ORDER — VANCOMYCIN HCL IN DEXTROSE 1-5 GM/200ML-% IV SOLN
1000.0000 mg | Freq: Once | INTRAVENOUS | Status: AC
  Administered 2014-11-25: 1000 mg via INTRAVENOUS
  Filled 2014-11-25: qty 200

## 2014-11-25 MED ORDER — METRONIDAZOLE 500 MG PO TABS
500.0000 mg | ORAL_TABLET | Freq: Three times a day (TID) | ORAL | Status: DC
  Administered 2014-11-25 – 2014-11-27 (×7): 500 mg via ORAL
  Filled 2014-11-25 (×7): qty 1

## 2014-11-25 MED ORDER — SODIUM CHLORIDE 0.9 % IV SOLN
INTRAVENOUS | Status: DC
  Administered 2014-11-25 – 2014-11-26 (×2): via INTRAVENOUS

## 2014-11-25 NOTE — Progress Notes (Signed)
CRITICAL VALUE ALERT  Critical value received:  C Dif positive  Date of notification:  11/25/14  Time of notification:  1250  Critical value read back:Yes.    Nurse who received alert:  Catie Despina Pole  MD notified (1st page):  C. Rama  Time of first page:  1252  MD notified (2nd page):  Time of second page:  Responding MD:  Loletha Grayer Rama  Time MD responded:  [phone]

## 2014-11-25 NOTE — Progress Notes (Addendum)
Progress Note   [name] [id number] DOB: 1946/04/02 DOA: 11/22/2014 PCP: [name], MD   Brief Narrative:   [name] is an 69 y.o. female with a PMH of hypertension, renal calculi, stage III chronic kidney disease, whose history of present illness dates back to May, when she presented to the ED with right flank pain and a CT scan at that time showed a 4.5 mm right proximal ureteral stone with associated hydronephrosis. A JJ stent was placed. She subsequently had an admission for treatment of sepsis secondary to pyelonephritis and recurrent obstructing kidney stone with hospitalization 11/12/14-11/17/14. She was discharged home on a course of Ceftin with urology follow-up. On 11/21/14, the patient underwent cystoscopy, right ureteroscopy and stone removal with laser lithotripsy and right ureteral stent placement. She then presented 11/22/14 with fever, leukocytosis, and acute renal failure. Urology following. Hospital course complicated by the development of C. difficile colitis.  Assessment/Plan:   Principal Problem:   Sepsis secondary to Clostridium difficile colitis in the setting of recent antibiotics use/nephrolithiasis - Source of sepsis initially felt to be from possible pyelonephritis given recent stent placement. - Source of sepsis now felt to be from C. difficile. Urine cultures were negative. Cultures negative to date. - Most recent cultures grew yeast. Continue Diflucan. Empiric vancomycin/Zosyn discontinued. Now on Flagyl. - Urology following, nothing new to offer (spoke with Dr. Gaynelle Arabian by telephone).  Active Problems:   AKI (acute kidney injury) in the setting of stage III chronic kidney disease / bilateral hydronephrosis - Baseline creatinine is around 1.9. Current creatinine elevated over usual baseline values, no improvement. - Concerning for obstructive process. Repeat renal ultrasound shows no improvement. - Continue to hydrate. Creatinine mildly  improved today. 4.8---> 4.0.    Essential hypertension, benign - Blood pressure improved. Norvasc and Lopressor currently on hold. Likely can resume antihypertensives soon.    Atrial fibrillation - On aspirin. Rate controlled. - ChadsVasc2 score is 3, being followed by cardiologist as an outpatient. - 2-D echo shows EF 65-70 percent, no regional wall motion abnormalities, LVH.    Normocytic anemia  - Likely anemia of chronic kidney disease. Monitor hemoglobin.    DVT Prophylaxis - Continue Lovenox.  Code Status: Full. Family Communication: Daughter, [name], updated at bedside. Disposition Plan: D/C home once cultures back and renal function improved, likely 2-3 days.   IV Access:    Peripheral IV   Procedures and diagnostic studies:   US Renal  11/23/2014   CLINICAL DATA:  Acute on chronic renal failure, history hypertension, atrial fibrillation, MI, CHF, kidney stones  EXAM: RENAL / URINARY TRACT ULTRASOUND COMPLETE  COMPARISON:  11/13/2014  FINDINGS: Right Kidney:  Length: 13.6 cm. Normal cortical thickness and echogenicity. Persistent mild RIGHT hydronephrosis. No mass or shadowing calcification.  Left Kidney:  Length: 9.8 cm. Diffuse cortical thinning. Normal cortical echogenicity. Lobulated renal contour. Mild LEFT hydronephrosis similar to previous exam. No mass or shadowing calcification.  Bladder:  Appears normal for degree of bladder distention.  IMPRESSION: Persistent mild BILATERAL hydronephrosis unchanged.  Cortical thinning LEFT kidney.   Electronically Signed   By: Lavonia Dana M.D.   On: 11/23/2014 11:08     Medical Consultants:    Dr. Gaynelle Arabian, Urology  Anti-Infectives:    Vancomycin 11/22/14--->11/25/14  Zosyn 11/22/14--->11/25/14  Flagyl 11/25/14--->  Subjective:   [name] continue to deny pain, nausea, dyspnea.  Dyspnea improved.  Appetite improving.  Having some diarrhea. Had 3 loose stools yesterday and 1 today.  Objective:    Filed Vitals:    11/24/14 1600 11/24/14 1832 11/24/14 2110 11/25/14 0556  BP: 107/45 137/63 129/63 146/63  Pulse: 61 76 80   Temp:  97.5 F (36.4 C) 98.2 F (36.8 C) 97.8 F (36.6 C)  TempSrc:  Oral Oral Oral  Resp: 15 16 18 18   Height:      Weight:      SpO2: 96% 99% 98% 98%    Intake/Output Summary (Last 24 hours) at 11/25/14 0816 Last data filed at 11/25/14 0700  Gross per 24 hour  Intake    510 ml  Output    800 ml  Net   -290 ml    Exam: Gen:  Non-toxic appearing Cardiovascular:  RRR, No M/R/G Respiratory:  Lungs CTAB Gastrointestinal:  Abdomen soft, NT/ND, + BS Extremities:  1+ edema   Data Reviewed:    Labs: Basic Metabolic Panel:  Recent Labs Lab 11/22/14 1945 11/23/14 0305 11/24/14 0340 11/25/14 0406  NA 129* 135  134* 138 140  K 4.0 4.4  4.4 4.4 4.2  CL 101 108  107 110 111  CO2 15* 18*  17* 18* 20*  GLUCOSE 113* 119*  120* 105* 100*  BUN 49* 46*  45* 51* 46*  CREATININE 4.85* 4.68*  4.64* 4.59* 4.08*  CALCIUM 8.7* 8.1*  8.1* 8.5* 8.8*  MG  --  1.8  --   --   PHOS  --  4.6  --   --    GFR Estimated Creatinine Clearance: 14.5 mL/min (by C-G formula based on Cr of 4.08). Liver Function Tests:  Recent Labs Lab 11/22/14 1945 11/23/14 0305  AST 22 16  ALT 27 20  ALKPHOS 166* 133*  BILITOT 0.5 0.3  PROT 6.4* 5.4*  ALBUMIN 2.4* 1.9*   Coagulation profile  Recent Labs Lab 11/23/14 0305  INR 1.31    CBC:  Recent Labs Lab 11/22/14 1945 11/23/14 0305 11/24/14 0340  WBC 32.2* 26.7*  27.1* 13.6*  NEUTROABS 29.9*  --   --   HGB 8.7* 7.8*  7.8* 8.2*  HCT 26.0* 23.8*  24.1* 25.3*  MCV 85.8 85.0  85.2 86.6  PLT 380 298  302 306   Cardiac Enzymes:  Recent Labs Lab 11/23/14 0305 11/23/14 0812 11/23/14 1419  TROPONINI 0.03 <0.03 <0.03   Thyroid function studies:  Recent Labs  11/23/14 0305  TSH 1.153   Sepsis Labs:  Recent Labs Lab 11/22/14 1945 11/22/14 2002 11/23/14 0305 11/24/14 0340  WBC 32.2*  --  26.7*   27.1* 13.6*  LATICACIDVEN  --  0.80  --   --    Microbiology Recent Results (from the past 240 hour(s))  Urine culture     Status: None   Collection Time: 11/21/14 12:23 PM  Result Value Ref Range Status   Specimen Description URINE, RANDOM CYSTOSCOPE URINE  Final   Special Requests PATIENT ON FOLLOWING ANCEF 2GM, CEFTIN 500MG   Final   Colony Count   Final    80,000 COLONIES/ML Performed at [place] Performed at [facility]   Final   Report Status 11/22/2014 FINAL  Final  Culture, blood (routine x 2)     Status: None (Preliminary result)   Collection Time: 11/22/14  7:45 PM  Result Value Ref Range Status   Specimen Description BLOOD RIGHT ARM  5 ML IN [facility] BOTTLE  Final   Special Requests NONE  Final   Culture   Final  BLOOD CULTURE RECEIVED NO GROWTH TO DATE CULTURE WILL BE HELD FOR 5 DAYS BEFORE ISSUING A FINAL NEGATIVE REPORT Performed at [facility]    Report Status PENDING  Incomplete  Urine culture     Status: None   Collection Time: 11/22/14  8:48 PM  Result Value Ref Range Status   Specimen Description URINE, CLEAN CATCH  Final   Special Requests NONE  Final   Colony Count NO GROWTH Performed at [facility]   Final   Culture NO GROWTH Performed at [facility]   Final   Report Status 11/23/2014 FINAL  Final  MRSA PCR Screening     Status: None   Collection Time: 11/23/14  2:56 AM  Result Value Ref Range Status   MRSA by PCR NEGATIVE NEGATIVE Final    Comment:        The GeneXpert MRSA Assay (FDA approved for NASAL specimens only), is one component of a comprehensive MRSA colonization surveillance program. It is not intended to diagnose MRSA infection nor to guide or monitor treatment for MRSA infections.   Urine culture     Status: None   Collection Time: 11/23/14  6:45 AM  Result Value Ref Range Status   Specimen Description URINE, CLEAN CATCH  Final   Special Requests NONE   Final   Colony Count NO GROWTH Performed at [facility]   Final   Culture NO GROWTH Performed at [facility]   Final   Report Status 11/24/2014 FINAL  Final     Medications:   . antiseptic oral rinse  7 mL Mouth Rinse BID  . aspirin  81 mg Oral Daily  . enoxaparin (LOVENOX) injection  30 mg Subcutaneous Q24H  . famotidine  20 mg Oral BID  . fluconazole  100 mg Oral Daily  . latanoprost  1 drop Both Eyes QHS  . nystatin ointment  1 application Topical TID  . piperacillin-tazobactam (ZOSYN)  IV  2.25 g Intravenous Q6H  . piperacillin-tazobactam (ZOSYN)  IV  3.375 g Intravenous Once  . sodium chloride  3 mL Intravenous Q12H  . timolol  1 drop Both Eyes q morning - 10a  . vancomycin  1,000 mg Intravenous Once   Continuous Infusions:    Time spent: 35 minutes with > 50% of time discussing current diagnostic test results, clinical impression and plan of care.   LOS: 2 days   Cotey Rakes  [facility] Pager [phone]. If unable to reach me by pager, please call my cell phone at [phone].  *Please refer to [email or web address], password [email or web address] to get updated schedule on who will round on this patient, as hospitalists switch teams weekly. If 7PM-7AM, please contact night-coverage at [email or web address], password [email or web address] for any overnight needs.  11/25/2014, 8:16 AM

## 2014-11-25 NOTE — Progress Notes (Signed)
Taking over care from the previous nurse. I agree with her assessment. Patient is resting quietly.

## 2014-11-25 NOTE — Progress Notes (Signed)
ANTIBIOTIC CONSULT NOTE - FOLLOW UP  Pharmacy Consult for Vancomycin Indication: rule out sepsis   Labs:  Recent Labs  11/22/14 1945 11/23/14 0305 11/23/14 0645 11/24/14 0340 11/25/14 0406  WBC 32.2* 26.7*  27.1*  --  13.6*  --   HGB 8.7* 7.8*  7.8*  --  8.2*  --   PLT 380 298  302  --  306  --   LABCREA  --   --  44.71  --   --   CREATININE 4.85* 4.68*  4.64*  --  4.59* 4.08*   Estimated Creatinine Clearance: 14.5 mL/min (by C-G formula based on Cr of 4.08).  Recent Labs  11/24/14 2201 11/25/14 0406  VANCOTROUGH 21*  --   VANCORANDOM  --  19    Assessment: 60 yoF admitted on 6/4 after recent discharge with fever and weakness.  She had cysto, laser lithotripsy w/ stone removal and ureteral stent placement 11/21/14.  Pharmacy is now consulted to dose Vancomycin and Zosyn for urosepsis.  PTA >> Fluconazole PO >>  6/4 >>zosyn >> 6/4 >>vancomycin >>   Today, 11/25/2014:  Afebrile  WBC: improved,  13.6  Renal: SCr remains elevated 4.08 (baseline ~ 1.9) with CrCl ~ 14.5 ml/min.  6/3 Urine cxt: 80,000 yeast.  6/4-6/5 Blood and urine cultures unrevealing  Vancomycin trough level = 19, after Vanc 1g IV given 6/4 2200 (~55 hours)  Goal of Therapy:  Vancomycin trough level 15-20 mcg/ml  Plan:   Vancomycin 1gm IV x 1  Recheck random vancomycin level in 48 hours  Follow up renal fxn, culture results, and clinical course.  Dolly Rias RPh 11/25/2014, 9:14 AM Pager [phone]

## 2014-11-25 NOTE — Care Management Note (Signed)
Case Management Note  Patient Details  Name: [name] MRN: [id number] Date of Birth: Nov 04, 1945  Subjective/Objective:     Transfer to 4e.From home.               Action/Plan:No anticipated d/c needs or orders.   Expected Discharge Date:                  Expected Discharge Plan:  Home/Self Care  In-House Referral:  Clinical Social Work  Discharge planning Services  CM Consult  Post Acute Care Choice:  NA Choice offered to:  NA  DME Arranged:  N/A DME Agency:  NA  HH Arranged:  NA HH Agency:  NA  Status of Service:  In process, will continue to follow  Medicare Important Message Given:    Date Medicare IM Given:    Medicare IM give by:    Date Additional Medicare IM Given:    Additional Medicare Important Message give by:     If discussed at [place] of Stay Meetings, dates discussed:    Additional Comments:  Dessa Phi, RN 11/25/2014, 11:01 AM

## 2014-11-26 DIAGNOSIS — A047 Enterocolitis due to Clostridium difficile: Secondary | ICD-10-CM

## 2014-11-26 DIAGNOSIS — N10 Acute tubulo-interstitial nephritis: Secondary | ICD-10-CM

## 2014-11-26 LAB — CBC
HCT: 27.5 % — ABNORMAL LOW (ref 36.0–46.0)
Hemoglobin: 8.8 g/dL — ABNORMAL LOW (ref 12.0–15.0)
MCH: 28 pg (ref 26.0–34.0)
MCHC: 32 g/dL (ref 30.0–36.0)
MCV: 87.6 fL (ref 78.0–100.0)
Platelets: 369 10*3/uL (ref 150–400)
RBC: 3.14 MIL/uL — ABNORMAL LOW (ref 3.87–5.11)
RDW: 16.5 % — ABNORMAL HIGH (ref 11.5–15.5)
WBC: 9.2 10*3/uL (ref 4.0–10.5)

## 2014-11-26 LAB — BASIC METABOLIC PANEL
Anion gap: 7 (ref 5–15)
BUN: 43 mg/dL — ABNORMAL HIGH (ref 6–20)
CO2: 21 mmol/L — ABNORMAL LOW (ref 22–32)
Calcium: 8.6 mg/dL — ABNORMAL LOW (ref 8.9–10.3)
Chloride: 111 mmol/L (ref 101–111)
Creatinine, Ser: 3.66 mg/dL — ABNORMAL HIGH (ref 0.44–1.00)
GFR calc Af Amer: 13 mL/min — ABNORMAL LOW (ref >60)
GFR calc non Af Amer: 12 mL/min — ABNORMAL LOW (ref >60)
Glucose, Bld: 102 mg/dL — ABNORMAL HIGH (ref 65–99)
Potassium: 4.4 mmol/L (ref 3.5–5.1)
Sodium: 139 mmol/L (ref 135–145)

## 2014-11-26 NOTE — Progress Notes (Addendum)
Progress Note   HADAR FOLLMER [id number] DOB: [birth year] DOA: 11/22/2014 PCP: [name], MD   Brief Narrative:   [name] is an 69 y.o. female with a PMH of hypertension, renal calculi, stage III chronic kidney disease, whose history of present illness dates back to May, when she presented to the ED with right flank pain and a CT scan at that time showed a 4.5 mm right proximal ureteral stone with associated hydronephrosis. A JJ stent was placed. She subsequently had an admission for treatment of sepsis secondary to pyelonephritis and recurrent obstructing kidney stone with hospitalization 11/12/14-11/17/14. She was discharged home on a course of Ceftin with urology follow-up. On 11/21/14, the patient underwent cystoscopy, right ureteroscopy and stone removal with laser lithotripsy and right ureteral stent placement. She then presented 11/22/14 with fever, leukocytosis, and acute renal failure. Urology following.  Assessment/Plan:   Principal Problem:   Sepsis secondary to pyelonephritis in the setting of nephrolithiasis - Likely from recurrent pyelonephritis. - Follow-up blood cultures. Repeat urine cultures negative. Most recent cultures grew yeast. Continue Diflucan. - Urology following per their last note.  Active Problems:   AKI (acute kidney injury) in the setting of stage III chronic kidney disease - Baseline creatinine is around 1.9. Creatinine trending down and on last check 3.6 - Concerning for obstructive process.  - Repeat renal ultrasound shows no improvement.    Essential hypertension, benign - Blood pressure relatively well controlled. Norvasc and Lopressor currently on hold.    Atrial fibrillation - On aspirin. Rate controlled. - ChadsVasc2 score is 3, being followed by cardiologist as an outpatient. - 2-D echo shows EF 65-70 percent, no regional wall motion abnormalities, LVH.    Normocytic anemia  - Likely anemia of chronic kidney disease.  Monitor hemoglobin.  C. difficile colitis - We'll plan on treating with Flagyl. - WBC levels on reassessment improved.    DVT Prophylaxis - Continue Lovenox.  Code Status: Full. Family Communication: discussed with patient Disposition Plan:  awaiting cultures   IV Access:    Peripheral IV   Procedures and diagnostic studies:   No results found.   Medical Consultants:    Dr. Gaynelle Arabian, Urology  Anti-Infectives:    Vancomycin 11/22/14--->  Zosyn 11/22/14--->  Subjective:   [name] no new complaints today.  Objective:    Filed Vitals:   11/25/14 1500 11/25/14 2045 11/26/14 0458 11/26/14 1424  BP: 143/83 139/72 138/71 142/67  Pulse: 64 73 71 70  Temp: 97.8 F (36.6 C) 97.7 F (36.5 C) 97.5 F (36.4 C) 97.7 F (36.5 C)  TempSrc: Oral Oral Oral Oral  Resp: 18 18 19 16   Height:      Weight:      SpO2: 99% 98% 96% 97%    Intake/Output Summary (Last 24 hours) at 11/26/14 1719 Last data filed at 11/26/14 1427  Gross per 24 hour  Intake   1740 ml  Output    950 ml  Net    790 ml    Exam: Gen:  Non-toxic appearing, awake and alert Cardiovascular:  RRR, No M/R/G Respiratory:  Lungs CTAB, equal chest rise Gastrointestinal:  Abdomen soft, NT/ND, + BS Extremities:  1+ edema   Data Reviewed:    Labs: Basic Metabolic Panel:  Recent Labs Lab 11/22/14 1945 11/23/14 0305 11/24/14 0340 11/25/14 0406 11/26/14 0422  NA 129* 135  134* 138 140 139  K 4.0 4.4  4.4 4.4 4.2 4.4  CL 101 108  107 110 111 111  CO2 15* 18*  17* 18* 20* 21*  GLUCOSE 113* 119*  120* 105* 100* 102*  BUN 49* 46*  45* 51* 46* 43*  CREATININE 4.85* 4.68*  4.64* 4.59* 4.08* 3.66*  CALCIUM 8.7* 8.1*  8.1* 8.5* 8.8* 8.6*  MG  --  1.8  --   --   --   PHOS  --  4.6  --   --   --    GFR Estimated Creatinine Clearance: 16.1 mL/min (by C-G formula based on Cr of 3.66). Liver Function Tests:  Recent Labs Lab 11/22/14 1945 11/23/14 0305  AST 22 16  ALT 27 20   ALKPHOS 166* 133*  BILITOT 0.5 0.3  PROT 6.4* 5.4*  ALBUMIN 2.4* 1.9*   Coagulation profile  Recent Labs Lab 11/23/14 0305  INR 1.31    CBC:  Recent Labs Lab 11/22/14 1945 11/23/14 0305 11/24/14 0340 11/26/14 0422  WBC 32.2* 26.7*  27.1* 13.6* 9.2  NEUTROABS 29.9*  --   --   --   HGB 8.7* 7.8*  7.8* 8.2* 8.8*  HCT 26.0* 23.8*  24.1* 25.3* 27.5*  MCV 85.8 85.0  85.2 86.6 87.6  PLT 380 298  302 306 369   Cardiac Enzymes:  Recent Labs Lab 11/23/14 0305 11/23/14 0812 11/23/14 1419  TROPONINI 0.03 <0.03 <0.03   Thyroid function studies: No results for input(s): TSH, T4TOTAL, T3FREE, THYROIDAB in the last 72 hours.  Invalid input(s): FREET3 Sepsis Labs:  Recent Labs Lab 11/22/14 1945 11/22/14 2002 11/23/14 0305 11/24/14 0340 11/26/14 0422  WBC 32.2*  --  26.7*  27.1* 13.6* 9.2  LATICACIDVEN  --  0.80  --   --   --    Microbiology Recent Results (from the past 240 hour(s))  Urine culture     Status: None   Collection Time: 11/21/14 12:23 PM  Result Value Ref Range Status   Specimen Description URINE, RANDOM CYSTOSCOPE URINE  Final   Special Requests PATIENT ON FOLLOWING ANCEF 2GM, CEFTIN 500MG   Final   Colony Count   Final    80,000 COLONIES/ML Performed at [place] Performed at [facility]   Final   Report Status 11/22/2014 FINAL  Final  Culture, blood (routine x 2)     Status: None (Preliminary result)   Collection Time: 11/22/14  7:45 PM  Result Value Ref Range Status   Specimen Description BLOOD RIGHT ARM  5 ML IN [facility] BOTTLE  Final   Special Requests NONE  Final   Culture   Final           BLOOD CULTURE RECEIVED NO GROWTH TO DATE CULTURE WILL BE HELD FOR 5 DAYS BEFORE ISSUING A FINAL NEGATIVE REPORT Performed at [facility]    Report Status PENDING  Incomplete  Urine culture     Status: None   Collection Time: 11/22/14  8:48 PM  Result Value Ref Range Status   Specimen Description  URINE, CLEAN CATCH  Final   Special Requests NONE  Final   Colony Count NO GROWTH Performed at [facility]   Final   Culture NO GROWTH Performed at [facility]   Final   Report Status 11/23/2014 FINAL  Final  MRSA PCR Screening     Status: None   Collection Time: 11/23/14  2:56 AM  Result Value Ref Range Status   MRSA by PCR NEGATIVE NEGATIVE Final    Comment:  The GeneXpert MRSA Assay (FDA approved for NASAL specimens only), is one component of a comprehensive MRSA colonization surveillance program. It is not intended to diagnose MRSA infection nor to guide or monitor treatment for MRSA infections.   Urine culture     Status: None   Collection Time: 11/23/14  6:45 AM  Result Value Ref Range Status   Specimen Description URINE, CLEAN CATCH  Final   Special Requests NONE  Final   Colony Count NO GROWTH Performed at [facility]   Final   Culture NO GROWTH Performed at [facility]   Final   Report Status 11/24/2014 FINAL  Final  Clostridium Difficile by PCR (not at [facility])     Status: Abnormal   Collection Time: 11/25/14  9:00 AM  Result Value Ref Range Status   C difficile by pcr POSITIVE (A) NEGATIVE Final    Comment: CRITICAL RESULT CALLED TO, READ BACK BY AND VERIFIED WITH: BEESON,K @ 1249 ON [id number] BY POTEAT,S      Medications:   . antiseptic oral rinse  7 mL Mouth Rinse BID  . aspirin  81 mg Oral Daily  . enoxaparin (LOVENOX) injection  30 mg Subcutaneous Q24H  . famotidine  20 mg Oral BID  . fluconazole  100 mg Oral Daily  . latanoprost  1 drop Both Eyes QHS  . metroNIDAZOLE  500 mg Oral 3 times per day  . nystatin ointment  1 application Topical TID  . sodium chloride  3 mL Intravenous Q12H  . timolol  1 drop Both Eyes q morning - 10a   Continuous Infusions:     LOS: 3 days   Velvet Bathe  [facility] Pager [phone].   *Please refer to [email or web address], password [email or web address] to get updated schedule on who will  round on this patient, as hospitalists switch teams weekly. If 7PM-7AM, please contact night-coverage at [email or web address], password [email or web address] for any overnight needs.  11/26/2014, 5:19 PM

## 2014-11-27 DIAGNOSIS — N179 Acute kidney failure, unspecified: Secondary | ICD-10-CM

## 2014-11-27 LAB — BASIC METABOLIC PANEL
Anion gap: 9 (ref 5–15)
BUN: 43 mg/dL — ABNORMAL HIGH (ref 6–20)
CO2: 19 mmol/L — ABNORMAL LOW (ref 22–32)
Calcium: 9 mg/dL (ref 8.9–10.3)
Chloride: 111 mmol/L (ref 101–111)
Creatinine, Ser: 3.41 mg/dL — ABNORMAL HIGH (ref 0.44–1.00)
GFR calc Af Amer: 15 mL/min — ABNORMAL LOW (ref >60)
GFR calc non Af Amer: 13 mL/min — ABNORMAL LOW (ref >60)
Glucose, Bld: 96 mg/dL (ref 65–99)
Potassium: 4.2 mmol/L (ref 3.5–5.1)
Sodium: 139 mmol/L (ref 135–145)

## 2014-11-27 MED ORDER — FLUCONAZOLE 100 MG PO TABS: 100.0000 mg | ORAL_TABLET | Freq: Every day | ORAL | Status: DC

## 2014-11-27 MED ORDER — METRONIDAZOLE 500 MG PO TABS: 500.0000 mg | ORAL_TABLET | Freq: Three times a day (TID) | ORAL | Status: DC

## 2014-11-27 MED ORDER — ONDANSETRON 4 MG PO TBDP: 4.0000 mg | ORAL_TABLET | Freq: Three times a day (TID) | ORAL | Status: DC | PRN

## 2014-11-27 NOTE — Discharge Summary (Signed)
Physician Discharge Summary  [name] [id number] DOB: 03/21/1946 DOA: 11/22/2014  PCP: [name], MD  Admit date: 11/22/2014 Discharge date: 11/27/2014  Time spent: > 35 minutes  Recommendations for Outpatient Follow-up:  1. Please monitor serum creatinine, currently improving.  Discharge Diagnoses:  Principal Problem:   Sepsis Active Problems:   Nephrolithiasis   AKI (acute kidney injury)   Essential hypertension, benign   Atrial fibrillation   Acute pyelonephritis   Acute on chronic renal failure   Stage III chronic kidney disease   Normocytic anemia   Diarrhea   C. difficile colitis   Bilateral hydronephrosis   Discharge Condition: stable  Diet recommendation: heart healthy  Filed Weights   11/22/14 1928 11/23/14 0200 11/24/14 0400  Weight: 97.07 kg (214 lb) 97.2 kg (214 lb 4.6 oz) 100.2 kg (220 lb 14.4 oz)    History of present illness:  From original HPI: 69 y.o. female with a PMH of hypertension, renal calculi, stage III chronic kidney disease, whose history of present illness dates back to May, when she presented to the ED with right flank pain and a CT scan at that time showed a 4.5 mm right proximal ureteral stone with associated hydronephrosis. A JJ stent was placed. She subsequently had an admission for treatment of sepsis secondary to pyelonephritis and recurrent obstructing kidney stone with hospitalization 11/12/14-11/17/14. She was discharged home on a course of Ceftin with urology follow-up. On 11/21/14, the patient underwent cystoscopy, right ureteroscopy and stone removal with laser lithotripsy and right ureteral stent placement. She then presented 11/22/14 with fever, leukocytosis, and acute renal failure.  Hospital Course:   Principal Problem:  Sepsis secondary to pyelonephritis in the setting of nephrolithiasis - Follow-up blood cultures. Repeat urine cultures negative. Most recent cultures grew yeast. Continue Diflucan for 3 more days to  complete an 8 day treatment course. - Pt has f/u with Urology after d/c early next week  Active Problems:  AKI (acute kidney injury) in the setting of stage III chronic kidney disease - Baseline creatinine is around 1.9. Creatinine trending down and on last check 3.4 - Concerning for obstructive process.  - Repeat renal ultrasound shows no improvement.   Essential hypertension, benign - Blood pressure relatively well controlled. Norvasc and Lopressor currently on hold.   Atrial fibrillation - On aspirin. Rate controlled. - ChadsVasc2 score is 3, being followed by cardiologist as an outpatient. - 2-D echo shows EF 65-70 percent, no regional wall motion abnormalities, LVH.   Normocytic anemia  - Likely anemia of chronic kidney disease. Monitor hemoglobin.  C. difficile colitis - We'll plan on treating with Flagyl for 13 more days to complete a 14 day total treatment course. - WBC levels wnl on last check  Procedures:  None  Consultations:  None  Discharge Exam: Filed Vitals:   11/27/14 0613  BP: 151/73  Pulse: 72  Temp: 97.6 F (36.4 C)  Resp: 18    General: Patient in no acute distress, alert and awake Cardiovascular: No cyanosis, pink and warm extremities Respiratory: No increased work of breathing, no audible wheezes, equal chest rise  Discharge Instructions   Discharge Instructions    Call MD for:  extreme fatigue    Complete by:  As directed      Call MD for:  persistant dizziness or light-headedness    Complete by:  As directed      Call MD for:  severe uncontrolled pain    Complete by:  As directed  Call MD for:  temperature >100.4    Complete by:  As directed      Diet - low sodium heart healthy    Complete by:  As directed      Discharge instructions    Complete by:  As directed   Please follow up with your primary care physician for further evaluation and recommendations.  Also follow up with your urologist for further monitoring of  your Serum creatinine and further recommendations.     Increase activity slowly    Complete by:  As directed           Current Discharge Medication List    START taking these medications   Details  metroNIDAZOLE (FLAGYL) 500 MG tablet Take 1 tablet (500 mg total) by mouth every 8 (eight) hours. Qty: 33 tablet, Refills: 0      CONTINUE these medications which have CHANGED   Details  fluconazole (DIFLUCAN) 100 MG tablet Take 1 tablet (100 mg total) by mouth daily. Qty: 3 tablet, Refills: 0      CONTINUE these medications which have NOT CHANGED   Details  amLODipine (NORVASC) 5 MG tablet Take 1 tablet (5 mg total) by mouth every evening. Qty: 30 tablet, Refills: 11    aspirin 81 MG tablet Take 81 mg by mouth daily.    bimatoprost (LUMIGAN) 0.01 % SOLN Place 1 drop into both eyes at bedtime.    metoprolol tartrate (LOPRESSOR) 25 MG tablet Take 1 tablet (25 mg total) by mouth 2 (two) times daily. Qty: 60 tablet, Refills: 11    timolol (BETIMOL) 0.5 % ophthalmic solution Place 1 drop into both eyes every morning.       STOP taking these medications     cefUROXime (CEFTIN) 500 MG tablet      miconazole (MICOTIN) 2 % cream      ranitidine (ZANTAC) 150 MG tablet      Ketorolac Tromethamine (SPRIX NA)      nystatin ointment (MYCOSTATIN)        Allergies  Allergen Reactions  . Vicodin [Hydrocodone-Acetaminophen] Nausea And Vomiting      The results of significant diagnostics from this hospitalization (including imaging, microbiology, ancillary and laboratory) are listed below for reference.    Significant Diagnostic Studies: Dg Chest 2 View  11/22/2014   CLINICAL DATA:  Confusion, fever, and shortness of breath for a few hr, onset of fever and weakness last night, recently discharge from hospital for sepsis related to kidney stone, tachycardia, ureteral stent placement yesterday, history hypertension, CHF, atrial fibrillation, MI, GERD  EXAM: CHEST  2 VIEW   COMPARISON:  10/29/2014  FINDINGS: Upper normal size of cardiac silhouette.  Large hiatal hernia.  Slight pulmonary vascular congestion.  Mediastinal contours otherwise normal.  Minimal bibasilar atelectasis.  No acute infiltrate, pleural effusion or pneumothorax.  Bones unremarkable.  IMPRESSION: Minimal bibasilar atelectasis.  Large hiatal hernia.   Electronically Signed   By: Lavonia Dana M.D.   On: 11/22/2014 20:23   Dg Chest 2 View  10/30/2014   CLINICAL DATA:  History of aspiration pneumonia, followup  EXAM: CHEST  2 VIEW  COMPARISON:  Chest x-ray of 10/06/2014 and images through the lung bases on CT abdomen of 10/26/2004  FINDINGS: No pneumonia is seen. There has been improvement in peribronchial thickening. Mediastinal and hilar contours are unchanged. A moderate size hiatal hernia remains. Heart size is stable. No acute bony abnormality is seen.  IMPRESSION: No active infiltrate or effusion. No change in moderate  size hiatal hernia.   Electronically Signed   By: Ivar Drape M.D.   On: 10/30/2014 08:38   Dg Abd 1 View  11/22/2014   CLINICAL DATA:  Fever. Altered mental status. Ureteral stent placement yesterday.  EXAM: ABDOMEN - 1 VIEW  COMPARISON:  Radiographs 11/13/2014.  CT 11/10/2014  FINDINGS: Right ureteral stent in place, proximal and distal coils in the expected location. The renal calculi on prior CT are not well seen radiographically. There are no dilated bowel loops to suggest obstruction. No free intra-abdominal air.  IMPRESSION: Right ureteral stent in place, in expected location. The renal calculi on prior CT are not well seen radiographically.   Electronically Signed   By: Jeb Levering M.D.   On: 11/22/2014 22:31   Dg Abd 1 View  11/13/2014   CLINICAL DATA:  Flank pain with blood in urine.  EXAM: ABDOMEN - 1 VIEW  COMPARISON:  11/10/2014 abdominal CT  FINDINGS: A right ureteral stent has similar positioning to CT 11/10/2014. Known right nephrolithiasis is difficult to visualize due  to faint density. Punctate densities over the left kidney move and are likely within small bowel. No evidence of ureteral calculus.  IMPRESSION: 1. Right internal ureteral stent, positioning unremarkable and stable from CT 3 days ago. 2. Bilateral nephrolithiasis is difficult to visualize radiographically.   Electronically Signed   By: Monte Fantasia M.D.   On: 11/13/2014 06:48   US Renal  11/23/2014   CLINICAL DATA:  Acute on chronic renal failure, history hypertension, atrial fibrillation, MI, CHF, kidney stones  EXAM: RENAL / URINARY TRACT ULTRASOUND COMPLETE  COMPARISON:  11/13/2014  FINDINGS: Right Kidney:  Length: 13.6 cm. Normal cortical thickness and echogenicity. Persistent mild RIGHT hydronephrosis. No mass or shadowing calcification.  Left Kidney:  Length: 9.8 cm. Diffuse cortical thinning. Normal cortical echogenicity. Lobulated renal contour. Mild LEFT hydronephrosis similar to previous exam. No mass or shadowing calcification.  Bladder:  Appears normal for degree of bladder distention.  IMPRESSION: Persistent mild BILATERAL hydronephrosis unchanged.  Cortical thinning LEFT kidney.   Electronically Signed   By: Lavonia Dana M.D.   On: 11/23/2014 11:08   US Renal  11/13/2014   CLINICAL DATA:  Flank pain.  EXAM: RENAL / URINARY TRACT ULTRASOUND COMPLETE  COMPARISON:  Abdominal CT 11/10/2014  FINDINGS: Right Kidney:  Length: 12 cm. Echogenic appearing cortex with cortical thinning to 1 cm. There is mild hydronephrosis, similar to prior. A previous seen right ureteral stent is subtly visualized in the upper right ureter.  Left Kidney:  Length: 12 cm. Mild hydronephrosis, stable from prior. Borderline echogenic cortex.  Bladder:  Completely decompressed.  IMPRESSION: 1. Mild bilateral hydronephrosis, stable from 11/10/2014. 2. Decompressed urinary bladder.   Electronically Signed   By: Monte Fantasia M.D.   On: 11/13/2014 03:53    Microbiology: Recent Results (from the past 240 hour(s))  Urine  culture     Status: None   Collection Time: 11/21/14 12:23 PM  Result Value Ref Range Status   Specimen Description URINE, RANDOM CYSTOSCOPE URINE  Final   Special Requests PATIENT ON FOLLOWING ANCEF 2GM, CEFTIN 500MG   Final   Colony Count   Final    80,000 COLONIES/ML Performed at [place] Performed at [facility]   Final   Report Status 11/22/2014 FINAL  Final  Culture, blood (routine x 2)     Status: None (Preliminary result)   Collection Time: 11/22/14  7:45 PM  Result  Value Ref Range Status   Specimen Description BLOOD RIGHT ARM  5 ML IN [facility] BOTTLE  Final   Special Requests NONE  Final   Culture   Final           BLOOD CULTURE RECEIVED NO GROWTH TO DATE CULTURE WILL BE HELD FOR 5 DAYS BEFORE ISSUING A FINAL NEGATIVE REPORT Performed at [facility]    Report Status PENDING  Incomplete  Urine culture     Status: None   Collection Time: 11/22/14  8:48 PM  Result Value Ref Range Status   Specimen Description URINE, CLEAN CATCH  Final   Special Requests NONE  Final   Colony Count NO GROWTH Performed at [facility]   Final   Culture NO GROWTH Performed at [facility]   Final   Report Status 11/23/2014 FINAL  Final  MRSA PCR Screening     Status: None   Collection Time: 11/23/14  2:56 AM  Result Value Ref Range Status   MRSA by PCR NEGATIVE NEGATIVE Final    Comment:        The GeneXpert MRSA Assay (FDA approved for NASAL specimens only), is one component of a comprehensive MRSA colonization surveillance program. It is not intended to diagnose MRSA infection nor to guide or monitor treatment for MRSA infections.   Urine culture     Status: None   Collection Time: 11/23/14  6:45 AM  Result Value Ref Range Status   Specimen Description URINE, CLEAN CATCH  Final   Special Requests NONE  Final   Colony Count NO GROWTH Performed at [facility]   Final   Culture NO GROWTH Performed at [facility]   Final   Report Status 11/24/2014 FINAL  Final  Clostridium Difficile by PCR (not at [facility])     Status: Abnormal   Collection Time: 11/25/14  9:00 AM  Result Value Ref Range Status   C difficile by pcr POSITIVE (A) NEGATIVE Final    Comment: CRITICAL RESULT CALLED TO, READ BACK BY AND VERIFIED WITH: BEESON,K @ 1249 ON [id number] BY POTEAT,S      Labs: Basic Metabolic Panel:  Recent Labs Lab 11/23/14 0305 11/24/14 0340 11/25/14 0406 11/26/14 0422 11/27/14 0430  NA 135  134* 138 140 139 139  K 4.4  4.4 4.4 4.2 4.4 4.2  CL 108  107 110 111 111 111  CO2 18*  17* 18* 20* 21* 19*  GLUCOSE 119*  120* 105* 100* 102* 96  BUN 46*  45* 51* 46* 43* 43*  CREATININE 4.68*  4.64* 4.59* 4.08* 3.66* 3.41*  CALCIUM 8.1*  8.1* 8.5* 8.8* 8.6* 9.0  MG 1.8  --   --   --   --   PHOS 4.6  --   --   --   --    Liver Function Tests:  Recent Labs Lab 11/22/14 1945 11/23/14 0305  AST 22 16  ALT 27 20  ALKPHOS 166* 133*  BILITOT 0.5 0.3  PROT 6.4* 5.4*  ALBUMIN 2.4* 1.9*   No results for input(s): LIPASE, AMYLASE in the last 168 hours. No results for input(s): AMMONIA in the last 168 hours. CBC:  Recent Labs Lab 11/22/14 1945 11/23/14 0305 11/24/14 0340 11/26/14 0422  WBC 32.2* 26.7*  27.1* 13.6* 9.2  NEUTROABS 29.9*  --   --   --   HGB 8.7* 7.8*  7.8* 8.2* 8.8*  HCT 26.0* 23.8*  24.1* 25.3* 27.5*  MCV  85.8 85.0  85.2 86.6 87.6  PLT 380 298  302 306 369   Cardiac Enzymes:  Recent Labs Lab 11/23/14 0305 11/23/14 0812 11/23/14 1419  TROPONINI 0.03 <0.03 <0.03   BNP: BNP (last 3 results) No results for input(s): BNP in the last 8760 hours.  ProBNP (last 3 results) No results for input(s): PROBNP in the last 8760 hours.  CBG: No results for input(s): GLUCAP in the last 168 hours.     Signed:  Velvet Bathe  [facility] 11/27/2014, 2:27 PM

## 2014-11-29 LAB — CULTURE, BLOOD (ROUTINE X 2): Culture: NO GROWTH

## 2014-12-15 ENCOUNTER — Other Ambulatory Visit: Payer: Self-pay

## 2015-02-11 ENCOUNTER — Other Ambulatory Visit: Payer: Self-pay | Admitting: Interventional Cardiology

## 2015-02-25 ENCOUNTER — Encounter (HOSPITAL_COMMUNITY): Payer: Self-pay

## 2015-02-25 ENCOUNTER — Emergency Department (HOSPITAL_COMMUNITY): Payer: Medicare Other

## 2015-02-25 ENCOUNTER — Inpatient Hospital Stay (HOSPITAL_COMMUNITY)
Admission: EM | Admit: 2015-02-25 | Discharge: 2015-03-01 | DRG: 872 | Disposition: A | Discharge status: Home / Self Care | Payer: Medicare Other | Attending: Family Medicine | Admitting: Family Medicine

## 2015-02-25 DIAGNOSIS — R252 Cramp and spasm: Secondary | ICD-10-CM | POA: Diagnosis present

## 2015-02-25 DIAGNOSIS — I252 Old myocardial infarction: Secondary | ICD-10-CM | POA: Diagnosis not present

## 2015-02-25 DIAGNOSIS — Z8249 Family history of ischemic heart disease and other diseases of the circulatory system: Secondary | ICD-10-CM

## 2015-02-25 DIAGNOSIS — I5032 Chronic diastolic (congestive) heart failure: Secondary | ICD-10-CM | POA: Diagnosis present

## 2015-02-25 DIAGNOSIS — Z8052 Family history of malignant neoplasm of bladder: Secondary | ICD-10-CM | POA: Diagnosis not present

## 2015-02-25 DIAGNOSIS — Z6833 Body mass index (BMI) 33.0-33.9, adult: Secondary | ICD-10-CM

## 2015-02-25 DIAGNOSIS — N131 Hydronephrosis with ureteral stricture, not elsewhere classified: Secondary | ICD-10-CM | POA: Diagnosis present

## 2015-02-25 DIAGNOSIS — Z79899 Other long term (current) drug therapy: Secondary | ICD-10-CM

## 2015-02-25 DIAGNOSIS — D649 Anemia, unspecified: Secondary | ICD-10-CM | POA: Diagnosis present

## 2015-02-25 DIAGNOSIS — I4891 Unspecified atrial fibrillation: Secondary | ICD-10-CM | POA: Diagnosis present

## 2015-02-25 DIAGNOSIS — Z885 Allergy status to narcotic agent status: Secondary | ICD-10-CM

## 2015-02-25 DIAGNOSIS — E860 Dehydration: Secondary | ICD-10-CM | POA: Diagnosis present

## 2015-02-25 DIAGNOSIS — M1990 Unspecified osteoarthritis, unspecified site: Secondary | ICD-10-CM | POA: Diagnosis present

## 2015-02-25 DIAGNOSIS — I129 Hypertensive chronic kidney disease with stage 1 through stage 4 chronic kidney disease, or unspecified chronic kidney disease: Secondary | ICD-10-CM | POA: Diagnosis present

## 2015-02-25 DIAGNOSIS — N3000 Acute cystitis without hematuria: Secondary | ICD-10-CM

## 2015-02-25 DIAGNOSIS — IMO0001 Reserved for inherently not codable concepts without codable children: Secondary | ICD-10-CM | POA: Insufficient documentation

## 2015-02-25 DIAGNOSIS — Z9841 Cataract extraction status, right eye: Secondary | ICD-10-CM

## 2015-02-25 DIAGNOSIS — Z9842 Cataract extraction status, left eye: Secondary | ICD-10-CM

## 2015-02-25 DIAGNOSIS — R509 Fever, unspecified: Secondary | ICD-10-CM | POA: Diagnosis not present

## 2015-02-25 DIAGNOSIS — I10 Essential (primary) hypertension: Secondary | ICD-10-CM | POA: Diagnosis present

## 2015-02-25 DIAGNOSIS — Z961 Presence of intraocular lens: Secondary | ICD-10-CM | POA: Diagnosis present

## 2015-02-25 DIAGNOSIS — Z87442 Personal history of urinary calculi: Secondary | ICD-10-CM | POA: Diagnosis not present

## 2015-02-25 DIAGNOSIS — K219 Gastro-esophageal reflux disease without esophagitis: Secondary | ICD-10-CM | POA: Diagnosis present

## 2015-02-25 DIAGNOSIS — I2510 Atherosclerotic heart disease of native coronary artery without angina pectoris: Secondary | ICD-10-CM | POA: Diagnosis present

## 2015-02-25 DIAGNOSIS — N179 Acute kidney failure, unspecified: Secondary | ICD-10-CM | POA: Diagnosis present

## 2015-02-25 DIAGNOSIS — Z833 Family history of diabetes mellitus: Secondary | ICD-10-CM | POA: Diagnosis not present

## 2015-02-25 DIAGNOSIS — D72829 Elevated white blood cell count, unspecified: Secondary | ICD-10-CM

## 2015-02-25 DIAGNOSIS — N184 Chronic kidney disease, stage 4 (severe): Secondary | ICD-10-CM | POA: Diagnosis present

## 2015-02-25 DIAGNOSIS — H409 Unspecified glaucoma: Secondary | ICD-10-CM | POA: Diagnosis present

## 2015-02-25 DIAGNOSIS — Z90710 Acquired absence of both cervix and uterus: Secondary | ICD-10-CM

## 2015-02-25 DIAGNOSIS — A419 Sepsis, unspecified organism: Principal | ICD-10-CM | POA: Diagnosis present

## 2015-02-25 DIAGNOSIS — N189 Chronic kidney disease, unspecified: Secondary | ICD-10-CM | POA: Diagnosis not present

## 2015-02-25 DIAGNOSIS — E785 Hyperlipidemia, unspecified: Secondary | ICD-10-CM | POA: Diagnosis present

## 2015-02-25 DIAGNOSIS — E440 Moderate protein-calorie malnutrition: Secondary | ICD-10-CM | POA: Diagnosis not present

## 2015-02-25 DIAGNOSIS — N390 Urinary tract infection, site not specified: Secondary | ICD-10-CM | POA: Diagnosis present

## 2015-02-25 LAB — URINALYSIS, ROUTINE W REFLEX MICROSCOPIC
Bilirubin Urine: NEGATIVE
Glucose, UA: NEGATIVE mg/dL
Ketones, ur: NEGATIVE mg/dL
Nitrite: POSITIVE — AB
Protein, ur: 100 mg/dL — AB
Specific Gravity, Urine: 1.012 (ref 1.005–1.030)
Urobilinogen, UA: 0.2 mg/dL (ref 0.0–1.0)
pH: 6 (ref 5.0–8.0)

## 2015-02-25 LAB — COMPREHENSIVE METABOLIC PANEL
ALT: 12 U/L — ABNORMAL LOW (ref 14–54)
AST: 18 U/L (ref 15–41)
Albumin: 3.6 g/dL (ref 3.5–5.0)
Alkaline Phosphatase: 82 U/L (ref 38–126)
Anion gap: 14 (ref 5–15)
BUN: 65 mg/dL — ABNORMAL HIGH (ref 6–20)
CO2: 17 mmol/L — ABNORMAL LOW (ref 22–32)
Calcium: 9.3 mg/dL (ref 8.9–10.3)
Chloride: 104 mmol/L (ref 101–111)
Creatinine, Ser: 5.8 mg/dL — ABNORMAL HIGH (ref 0.44–1.00)
GFR calc Af Amer: 8 mL/min — ABNORMAL LOW (ref >60)
GFR calc non Af Amer: 7 mL/min — ABNORMAL LOW (ref >60)
Glucose, Bld: 107 mg/dL — ABNORMAL HIGH (ref 65–99)
Potassium: 4.4 mmol/L (ref 3.5–5.1)
Sodium: 135 mmol/L (ref 135–145)
Total Bilirubin: 0.4 mg/dL (ref 0.3–1.2)
Total Protein: 7.7 g/dL (ref 6.5–8.1)

## 2015-02-25 LAB — CBC WITH DIFFERENTIAL/PLATELET
Basophils Absolute: 0 10*3/uL (ref 0.0–0.1)
Basophils Relative: 0 % (ref 0–1)
Eosinophils Absolute: 0.1 10*3/uL (ref 0.0–0.7)
Eosinophils Relative: 1 % (ref 0–5)
HCT: 35 % — ABNORMAL LOW (ref 36.0–46.0)
Hemoglobin: 11 g/dL — ABNORMAL LOW (ref 12.0–15.0)
Lymphocytes Relative: 5 % — ABNORMAL LOW (ref 12–46)
Lymphs Abs: 0.7 10*3/uL (ref 0.7–4.0)
MCH: 27.6 pg (ref 26.0–34.0)
MCHC: 31.4 g/dL (ref 30.0–36.0)
MCV: 87.9 fL (ref 78.0–100.0)
Monocytes Absolute: 0.7 10*3/uL (ref 0.1–1.0)
Monocytes Relative: 4 % (ref 3–12)
Neutro Abs: 13.2 10*3/uL — ABNORMAL HIGH (ref 1.7–7.7)
Neutrophils Relative %: 90 % — ABNORMAL HIGH (ref 43–77)
Platelets: 376 10*3/uL (ref 150–400)
RBC: 3.98 MIL/uL (ref 3.87–5.11)
RDW: 14 % (ref 11.5–15.5)
WBC: 14.7 10*3/uL — ABNORMAL HIGH (ref 4.0–10.5)

## 2015-02-25 LAB — URINE MICROSCOPIC-ADD ON

## 2015-02-25 LAB — I-STAT CG4 LACTIC ACID, ED: Lactic Acid, Venous: 1.7 mmol/L (ref 0.5–2.0)

## 2015-02-25 MED ORDER — INFLUENZA VAC SPLIT QUAD 0.5 ML IM SUSY
0.5000 mL | PREFILLED_SYRINGE | INTRAMUSCULAR | Status: AC
  Administered 2015-02-26: 0.5 mL via INTRAMUSCULAR
  Filled 2015-02-25: qty 0.5

## 2015-02-25 MED ORDER — HYDRALAZINE HCL 20 MG/ML IJ SOLN: 5.0000 mg | INTRAMUSCULAR | Status: DC | PRN

## 2015-02-25 MED ORDER — METOPROLOL TARTRATE 25 MG PO TABS
25.0000 mg | ORAL_TABLET | Freq: Every day | ORAL | Status: DC
  Administered 2015-02-26 – 2015-03-01 (×4): 25 mg via ORAL
  Filled 2015-02-25 (×4): qty 1

## 2015-02-25 MED ORDER — ENSURE ENLIVE PO LIQD
237.0000 mL | Freq: Two times a day (BID) | ORAL | Status: DC
  Administered 2015-02-26 – 2015-03-01 (×5): 237 mL via ORAL

## 2015-02-25 MED ORDER — CYCLOBENZAPRINE HCL 5 MG PO TABS
5.0000 mg | ORAL_TABLET | Freq: Three times a day (TID) | ORAL | Status: DC | PRN
  Administered 2015-02-26 – 2015-02-28 (×3): 5 mg via ORAL
  Filled 2015-02-25 (×3): qty 1

## 2015-02-25 MED ORDER — FAMOTIDINE 20 MG PO TABS
20.0000 mg | ORAL_TABLET | Freq: Every day | ORAL | Status: DC
  Administered 2015-02-26 – 2015-03-01 (×4): 20 mg via ORAL
  Filled 2015-02-25 (×4): qty 1

## 2015-02-25 MED ORDER — TIMOLOL MALEATE 0.5 % OP SOLN
1.0000 [drp] | Freq: Every morning | OPHTHALMIC | Status: DC
  Administered 2015-02-26 – 2015-03-01 (×4): 1 [drp] via OPHTHALMIC
  Filled 2015-02-25: qty 5

## 2015-02-25 MED ORDER — SODIUM CHLORIDE 0.9 % IV SOLN
INTRAVENOUS | Status: DC
  Administered 2015-02-26: via INTRAVENOUS
  Administered 2015-02-26: 1000 mL via INTRAVENOUS
  Administered 2015-02-28 (×2): via INTRAVENOUS
  Administered 2015-02-28: 100 mL/h via INTRAVENOUS
  Administered 2015-03-01: 09:00:00 via INTRAVENOUS

## 2015-02-25 MED ORDER — SODIUM CHLORIDE 0.9 % IV BOLUS (SEPSIS)
1000.0000 mL | Freq: Once | INTRAVENOUS | Status: AC
  Administered 2015-02-25: 1000 mL via INTRAVENOUS

## 2015-02-25 MED ORDER — ACETAMINOPHEN 500 MG PO TABS
1000.0000 mg | ORAL_TABLET | Freq: Once | ORAL | Status: AC
  Administered 2015-02-25: 1000 mg via ORAL
  Filled 2015-02-25: qty 2

## 2015-02-25 MED ORDER — LATANOPROST 0.005 % OP SOLN
1.0000 [drp] | Freq: Every day | OPHTHALMIC | Status: DC
  Administered 2015-02-26 – 2015-02-28 (×4): 1 [drp] via OPHTHALMIC
  Filled 2015-02-25: qty 2.5

## 2015-02-25 MED ORDER — HEPARIN SODIUM (PORCINE) 5000 UNIT/ML IJ SOLN
5000.0000 [IU] | Freq: Three times a day (TID) | INTRAMUSCULAR | Status: DC
  Administered 2015-02-26 – 2015-03-01 (×10): 5000 [IU] via SUBCUTANEOUS
  Filled 2015-02-25 (×12): qty 1

## 2015-02-25 MED ORDER — DEXTROSE 5 % IV SOLN
1.0000 g | Freq: Once | INTRAVENOUS | Status: AC
  Administered 2015-02-25: 1 g via INTRAVENOUS
  Filled 2015-02-25: qty 10

## 2015-02-25 MED ORDER — SODIUM CHLORIDE 0.9 % IV BOLUS (SEPSIS)
1000.0000 mL | Freq: Once | INTRAVENOUS | Status: AC
Start: 2015-02-25 — End: 2015-02-25
  Administered 2015-02-25: 1000 mL via INTRAVENOUS

## 2015-02-25 MED ORDER — ONDANSETRON HCL 4 MG PO TABS: 4.0000 mg | ORAL_TABLET | Freq: Four times a day (QID) | ORAL | Status: DC | PRN

## 2015-02-25 MED ORDER — ONDANSETRON HCL 4 MG/2ML IJ SOLN
4.0000 mg | Freq: Three times a day (TID) | INTRAMUSCULAR | Status: AC | PRN
Start: 2015-02-25 — End: 2015-02-26

## 2015-02-25 MED ORDER — ACETAMINOPHEN 325 MG PO TABS
650.0000 mg | ORAL_TABLET | Freq: Four times a day (QID) | ORAL | Status: DC | PRN
  Administered 2015-02-26 – 2015-02-27 (×4): 650 mg via ORAL
  Filled 2015-02-25 (×5): qty 2

## 2015-02-25 MED ORDER — ONDANSETRON HCL 4 MG/2ML IJ SOLN: 4.0000 mg | Freq: Three times a day (TID) | INTRAMUSCULAR | Status: DC | PRN

## 2015-02-25 MED ORDER — ONDANSETRON HCL 4 MG/2ML IJ SOLN: 4.0000 mg | Freq: Four times a day (QID) | INTRAMUSCULAR | Status: DC | PRN

## 2015-02-25 MED ORDER — DEXTROSE 5 % IV SOLN
1.0000 g | INTRAVENOUS | Status: DC
  Administered 2015-02-26 – 2015-03-01 (×4): 1 g via INTRAVENOUS
  Filled 2015-02-25 (×5): qty 10

## 2015-02-25 NOTE — ED Provider Notes (Signed)
CSN: [id number]     Arrival date & time 02/25/15  1843 History   First MD Initiated Contact with Patient 02/25/15 1902     Chief Complaint  Patient presents with  . Fever  . Urinary Frequency     (Consider location/radiation/quality/duration/timing/severity/associated sxs/prior Treatment) HPI 69 year old female who presents with fever. History of renal calculi and CKD. Reports history of sepsis and septic shock in the setting of pyelonephritis and obstructed renal calculi. Last admission was in June 2016. Reports being in her usual state of health. Has felt weak over the past few days, and today had a fever of 102 Fahrenheit at home. Denies any cough, shortness of breath, chest pain, congestion sore throat or runny nose. Had loose stools today, but this is not reminiscent of when she has had C. difficile in the past. Denies any abdominal pain, nausea or vomiting. At baseline she has dysuria and urinary frequency, so she is unsure this may be due to her urinary tract infection. Denies associating flank pain.  Past Medical History  Diagnosis Date  . Hypertension   . GERD (gastroesophageal reflux disease)   . Glaucoma   . H/O renal calculi 2002 & 2006  . H/O hiatal hernia   . Headache(784.0)     migraine  . Pneumonia     dx 10-06-2014 per CXR--  on 10-27-2014 pt states finished antibiotic and denies cough or fever  . Hyperlipidemia   . History of MI (myocardial infarction)     10/ 2013 in setting of Septic Shock  . History of atrial fibrillation     10/ 2013  in setting of Septic Shock  . History of CHF (congestive heart failure)     10/ 2013 in setting of septic shock  . Sigmoid diverticulosis   . Nephrolithiasis     bilateral  . CKD (chronic kidney disease), stage III     nephrologist-  dr Florene Glen  . Right ureteral stone   . History of acute respiratory failure     10/ 2013  -- ventilated in setting septic shock  . Dysrhythmia     Afib in 2013 when she had septic shock related  to stone ureteral obstruction  . Myocardial infarction     Was reported in 2013 during hospitalization with septic shock  . Arthritis     knee's  . Foley catheter in place   . Septic shock 04/04/2012  . Sepsis    Past Surgical History  Procedure Laterality Date  . Cystoscopy w/ ureteral stent placement  04/04/2012    Procedure: CYSTOSCOPY WITH RETROGRADE PYELOGRAM/URETERAL STENT PLACEMENT;  Surgeon: Ailene Rud, MD;  Location: [facility];  Service: Urology;  Laterality: Left;  . Total abdominal hysterectomy w/ bilateral salpingoophorectomy  1993    secondary to fibroids  . Breast biopsy Left 08/23/07    benign fibrocystic with duct ectasia  . Cardiac catheterization  07-11-2012  dr Irish Lack    Abnormal stress test/   normal coronary arteries/  LVEDP  29mmHg  . Cardiovascular stress test  06-26-2012  dr Irish Lack    marked ischemia in the basal anterior, mid anterior, apical inferior regions/  normal LVF, ef 63%  . Transthoracic echocardiogram  04-09-2012    normal LVF,  ef 60-65%,  mild LAE,  mild TR, trivial MR and PR  . Cataract extraction w/ intraocular lens  implant, bilateral    . Knee arthroscopy Left 02-14-2003  . Laparoscopic cholecystectomy  03-23-2005  . Extracorporeal shock wave lithotripsy  05-28-2012  &  10-08-2012  . Cystoscopy with stent placement Right 10/28/2014    Procedure: RIGHT URETERAL STENT PLACEMENT;  Surgeon: Irine Seal, MD;  Location: [facility];  Service: Urology;  Laterality: Right;  . Cystoscopy/retrograde/ureteroscopy/stone extraction with basket Right 11/21/2014    Procedure: CYSTOSCOPY/RIGHT RETROGRADE PYELOGRAM/RIGHT URETEROSCOPY/BASKET EXTRACTION/RIGHT PYELOSCOPY/LASER OF STONE/RIGHT DOUBLE J STENT;  Surgeon: Carolan Clines, MD;  Location: [place];  Service: Urology;  Laterality: Right;  . Holmium laser application Right [id number]    Procedure: HOLMIUM LASER APPLICATION;  Surgeon: Carolan Clines, MD;  Location:  [facility];  Service: Urology;  Laterality: Right;   Family History  Problem Relation Age of Onset  . Hypertension Mother   . Cancer Mother 60    breast  . Dementia Mother   . Hypertension Brother   . Diabetes Brother   . Cancer Father 64    pancreatic  . Heart failure Paternal Grandmother   . Bladder Cancer Maternal Grandfather    Social History  Substance Use Topics  . Smoking status: Never Smoker   . Smokeless tobacco: Never Used  . Alcohol Use: No   OB History    Gravida Para Term Preterm AB TAB SAB Ectopic Multiple Living   1 1 1       1      Review of Systems 10/14 systems reviewed and are negative other than those stated in the HPI   Allergies  Vicodin  Home Medications   Prior to Admission medications   Medication Sig Start Date End Date Taking? Authorizing Provider  acetaminophen (TYLENOL) 500 MG tablet Take 500 mg by mouth every 6 (six) hours as needed for moderate pain.   Yes Historical Provider, MD  bimatoprost (LUMIGAN) 0.01 % SOLN Place 1 drop into both eyes at bedtime.   Yes Historical Provider, MD  metoprolol tartrate (LOPRESSOR) 25 MG tablet Take 1 tablet (25 mg total) by mouth 2 (two) times daily. Patient taking differently: Take 25 mg by mouth daily.  11/21/14  Yes Jettie Booze, MD  ranitidine (ZANTAC) 150 MG tablet Take 150 mg by mouth 2 (two) times daily as needed for heartburn.   Yes Historical Provider, MD  timolol (BETIMOL) 0.5 % ophthalmic solution Place 1 drop into both eyes every morning.    Yes Historical Provider, MD  amLODipine (NORVASC) 5 MG tablet Take 1 tablet (5 mg total) by mouth every evening. Patient not taking: Reported on 02/25/2015 11/21/14   Jettie Booze, MD  fluconazole (DIFLUCAN) 100 MG tablet Take 1 tablet (100 mg total) by mouth daily. Patient not taking: Reported on 02/25/2015 11/28/14   Velvet Bathe, MD  metroNIDAZOLE (FLAGYL) 500 MG tablet Take 1 tablet (500 mg total) by mouth every 8 (eight)  hours. Patient not taking: Reported on 02/25/2015 11/27/14   Velvet Bathe, MD  ondansetron (ZOFRAN ODT) 4 MG disintegrating tablet Take 1 tablet (4 mg total) by mouth every 8 (eight) hours as needed for nausea or vomiting. Patient not taking: Reported on 02/25/2015 11/27/14   Velvet Bathe, MD   BP 116/62 mmHg  Pulse 117  Temp(Src) 104 F (40 C) (Rectal)  Resp 20  Ht 5\' 5"  (1.651 m)  Wt 190 lb (86.183 kg)  BMI 31.62 kg/m2  SpO2 97%  LMP 01/19/1992 Physical Exam Physical Exam  Nursing note and vitals reviewed. Constitutional: Very anxious, well developed, well nourished, non-toxic, and in no acute distress Head: Normocephalic and atraumatic.  Mouth/Throat: Oropharynx is clear and moist.  Neck: Normal range of motion. Neck supple.  Cardiovascular: Tachycardic rate and regular rhythm.  No edema.  Pulmonary/Chest: Effort normal and breath sounds normal.  Abdominal: Soft. There is no tenderness. There is no rebound and no guarding. No CVA tenderness. Musculoskeletal: Normal range of motion.  Neurological: Alert, no facial droop, fluent speech, moves all extremities symmetrically Skin: Skin is warm and dry.  Psychiatric: Cooperative  ED Course  Procedures (including critical care time) Labs Review Labs Reviewed  URINALYSIS, ROUTINE W REFLEX MICROSCOPIC (NOT AT [facility]) - Abnormal; Notable for the following:    Color, Urine AMBER (*)    APPearance TURBID (*)    Hgb urine dipstick LARGE (*)    Protein, ur 100 (*)    Nitrite POSITIVE (*)    Leukocytes, UA LARGE (*)    All other components within normal limits  CBC WITH DIFFERENTIAL/PLATELET - Abnormal; Notable for the following:    WBC 14.7 (*)    Hemoglobin 11.0 (*)    HCT 35.0 (*)    Neutrophils Relative % 90 (*)    Neutro Abs 13.2 (*)    Lymphocytes Relative 5 (*)    All other components within normal limits  COMPREHENSIVE METABOLIC PANEL - Abnormal; Notable for the following:    CO2 17 (*)    Glucose, Bld 107 (*)    BUN 65 (*)     Creatinine, Ser 5.80 (*)    ALT 12 (*)    GFR calc non Af Amer 7 (*)    GFR calc Af Amer 8 (*)    All other components within normal limits  URINE CULTURE  URINE MICROSCOPIC-ADD ON  I-STAT CG4 LACTIC ACID, ED  I-STAT CG4 LACTIC ACID, ED    Imaging Review Dg Chest 2 View  02/25/2015   CLINICAL DATA:  Fever.  History of CHF and atrial fibrillation.  EXAM: CHEST  2 VIEW  COMPARISON:  Chest radiograph 11/22/2014  FINDINGS: Stable cardiomegaly. Hiatal hernia. No consolidative pulmonary opacities. No pleural effusion or pneumothorax. Cholecystectomy clips.  IMPRESSION: No acute cardiopulmonary process.   Electronically Signed   By: Lovey Newcomer M.D.   On: 02/25/2015 20:06   I have personally reviewed and evaluated these images and lab results as part of my medical decision-making.    MDM   Final diagnoses:  Acute cystitis without hematuria  Sepsis, due to unspecified organism  Acute on chronic kidney failure  Leukocytosis    69 year old female who presents with fever and dysuria. On arrival she is febrile to 104 Fahrenheit and tachycardic to 110s. No respiratory distress other she is very anxious appearing, and she is normotensive. Abdomen is soft and nontender, and cardiopulmonary exam is unremarkable aside from tachycardia. Infectious workup pursued and she has evidence of sepsis secondary to urinary tract infection. She is given a gram of ceftriaxone and urinalysis is sent for culture. No evidence of flank pain or abdominal pain to suggest pyelonephritis, or concomitant obstructing renal calculi. Chest x-ray shows no acute cardiopulmonary processes. Received 2L  IV fluids.also evidence of acute on chronic kidney injury, with a creatinine of 5.8.  Admitted to hospitalist service for ongoing management.  CRITICAL CARE Performed by: Forde Dandy   Total critical care time: 35 min  Critical care time was exclusive of separately billable procedures and treating other patients.  Critical  care was necessary to treat or prevent imminent or life-threatening deterioration.  Critical care was time spent personally by me on the following activities: management of sepsis,  development of treatment plan with patient and/or surrogate as well as nursing, discussions with consultants, evaluation of patient's response to treatment, examination of patient, obtaining history from patient or surrogate, ordering and performing treatments and interventions, ordering and review of laboratory studies, ordering and review of radiographic studies, pulse oximetry and re-evaluation of patient's condition.   Forde Dandy, MD 02/25/15 [id number]

## 2015-02-25 NOTE — ED Notes (Signed)
Patient had a fever at home of 102.o orally. Patient has a history of sepsis x 2. Patient has been having urinary frequency.

## 2015-02-25 NOTE — ED Notes (Signed)
Bed: WA03 Expected date:  Expected time:  Means of arrival:  Comments: Hold for triage  3

## 2015-02-25 NOTE — H&P (Signed)
[facility] History and Physical  [name] [id number] DOB: 1945/11/16 DOA: 02/25/2015  Referring physician: ED physician PCP: [name], MD  Specialists:   Chief Complaint: fever, dysuria and right foot cramps  HPI: [name] is a 69 y.o. female with PMH of renal calculi, CKD-IV, hypertension, hyperlipidemia, GERD, CAD, MI, A fib (triggered by septic shock and resolved per pt), diastolic congestive heart failure (EF 65-70%), septic shock in the setting of pyelonephritis and obstructed renal calculi, and C. difficile colitis, who presents with a fever, dysuria, right foot cramps.  Patient reports that she started having fever with temperature 102 today. She has dysuria, increased urinary frequency and burning on urination, but she states that at baseline, she also has some dysuria and urinary frequency, so she is unsure if this may be due to her urinary tract infection. She denies nausea, vomiting, abdominal pain. She had hx of C. difficile colitis (C. difficile positive on 11/24/12) and was treated with Flagyl. She reports that she had loose stool 2 days ago, and has not had diarrhea since yesterday. Patient does not have cough, chest pain, shortness of breath, unilateral weakness. She has right foot cramps which started this afternoon. Patient does not have flank pain and tenderness over calf areas bilaterally.  In ED, patient was found to have positive urinalysis with the large leukocytosis, lactate 1.70, WBC 14.7, temperature 104, tachycardia, worsening renal function, negative chest x-ray for acute abnormalities. Patient is admitted to inpatient for further evaluation and treatment.  Where does patient live?   At home    Can patient participate in ADLs?  Some   Review of Systems:   General: has fevers, no chills, no changes in body weight, has poor appetite, has fatigue HEENT: no blurry vision, hearing changes or sore throat Pulm: no dyspnea, coughing,  wheezing CV: no chest pain, palpitations Abd: no nausea, vomiting, abdominal pain, diarrhea, constipation GU: has dysuria, burning on urination, increased urinary frequency, no hematuria  Ext: no leg edema. Has R foot cramps Neuro: no unilateral weakness, numbness, or tingling, no vision change or hearing loss Skin: no rash MSK: no deformity, no limitation of range of movement in spin Heme: No easy bruising.  Travel history: No recent long distant travel.  Allergy:  Allergies  Allergen Reactions  . Vicodin [Hydrocodone-Acetaminophen] Nausea And Vomiting    Past Medical History  Diagnosis Date  . Hypertension   . GERD (gastroesophageal reflux disease)   . Glaucoma   . H/O renal calculi 2002 & 2006  . H/O hiatal hernia   . Headache(784.0)     migraine  . Pneumonia     dx 10-06-2014 per CXR--  on 10-27-2014 pt states finished antibiotic and denies cough or fever  . Hyperlipidemia   . History of MI (myocardial infarction)     10/ 2013 in setting of Septic Shock  . History of atrial fibrillation     10/ 2013  in setting of Septic Shock  . History of CHF (congestive heart failure)     10/ 2013 in setting of septic shock  . Sigmoid diverticulosis   . Nephrolithiasis     bilateral  . CKD (chronic kidney disease), stage III     nephrologist-  dr Florene Glen  . Right ureteral stone   . History of acute respiratory failure     10/ 2013  -- ventilated in setting septic shock  . Dysrhythmia     Afib in 2013 when she had septic shock related  to stone ureteral obstruction  . Myocardial infarction     Was reported in 2013 during hospitalization with septic shock  . Arthritis     knee's  . Foley catheter in place   . Septic shock 04/04/2012  . Sepsis     Past Surgical History  Procedure Laterality Date  . Cystoscopy w/ ureteral stent placement  04/04/2012    Procedure: CYSTOSCOPY WITH RETROGRADE PYELOGRAM/URETERAL STENT PLACEMENT;  Surgeon: Ailene Rud, MD;  Location: [facility];  Service: Urology;  Laterality: Left;  . Total abdominal hysterectomy w/ bilateral salpingoophorectomy  1993    secondary to fibroids  . Breast biopsy Left 08/23/07    benign fibrocystic with duct ectasia  . Cardiac catheterization  07-11-2012  dr Irish Lack    Abnormal stress test/   normal coronary arteries/  LVEDP  66mmHg  . Cardiovascular stress test  06-26-2012  dr Irish Lack    marked ischemia in the basal anterior, mid anterior, apical inferior regions/  normal LVF, ef 63%  . Transthoracic echocardiogram  04-09-2012    normal LVF,  ef 60-65%,  mild LAE,  mild TR, trivial MR and PR  . Cataract extraction w/ intraocular lens  implant, bilateral    . Knee arthroscopy Left 02-14-2003  . Laparoscopic cholecystectomy  03-23-2005  . Extracorporeal shock wave lithotripsy  05-28-2012  &  10-08-2012  . Cystoscopy with stent placement Right 10/28/2014    Procedure: RIGHT URETERAL STENT PLACEMENT;  Surgeon: Irine Seal, MD;  Location: [facility];  Service: Urology;  Laterality: Right;  . Cystoscopy/retrograde/ureteroscopy/stone extraction with basket Right 11/21/2014    Procedure: CYSTOSCOPY/RIGHT RETROGRADE PYELOGRAM/RIGHT URETEROSCOPY/BASKET EXTRACTION/RIGHT PYELOSCOPY/LASER OF STONE/RIGHT DOUBLE J STENT;  Surgeon: Carolan Clines, MD;  Location: [facility];  Service: Urology;  Laterality: Right;  . Holmium laser application Right [id number]    Procedure: HOLMIUM LASER APPLICATION;  Surgeon: Carolan Clines, MD;  Location: [facility];  Service: Urology;  Laterality: Right;    Social History:  reports that she has never smoked. She has never used smokeless tobacco. She reports that she does not drink alcohol or use illicit drugs.  Family History:  Family History  Problem Relation Age of Onset  . Hypertension Mother   . Cancer Mother 17    breast  . Dementia Mother   . Hypertension Brother   . Diabetes Brother   . Cancer Father 43     pancreatic  . Heart failure Paternal Grandmother   . Bladder Cancer Maternal Grandfather      Prior to Admission medications   Medication Sig Start Date End Date Taking? Authorizing Provider  acetaminophen (TYLENOL) 500 MG tablet Take 500 mg by mouth every 6 (six) hours as needed for moderate pain.   Yes Historical Provider, MD  bimatoprost (LUMIGAN) 0.01 % SOLN Place 1 drop into both eyes at bedtime.   Yes Historical Provider, MD  metoprolol tartrate (LOPRESSOR) 25 MG tablet Take 1 tablet (25 mg total) by mouth 2 (two) times daily. Patient taking differently: Take 25 mg by mouth daily.  11/21/14  Yes Jettie Booze, MD  ranitidine (ZANTAC) 150 MG tablet Take 150 mg by mouth 2 (two) times daily as needed for heartburn.   Yes Historical Provider, MD  timolol (BETIMOL) 0.5 % ophthalmic solution Place 1 drop into both eyes every morning.    Yes Historical Provider, MD  amLODipine (NORVASC) 5 MG tablet Take 1 tablet (5 mg total) by mouth every evening. Patient not taking:  Reported on 02/25/2015 11/21/14   Jettie Booze, MD  fluconazole (DIFLUCAN) 100 MG tablet Take 1 tablet (100 mg total) by mouth daily. Patient not taking: Reported on 02/25/2015 11/28/14   Velvet Bathe, MD  metroNIDAZOLE (FLAGYL) 500 MG tablet Take 1 tablet (500 mg total) by mouth every 8 (eight) hours. Patient not taking: Reported on 02/25/2015 11/27/14   Velvet Bathe, MD  ondansetron (ZOFRAN ODT) 4 MG disintegrating tablet Take 1 tablet (4 mg total) by mouth every 8 (eight) hours as needed for nausea or vomiting. Patient not taking: Reported on 02/25/2015 11/27/14   Velvet Bathe, MD    Physical Exam: Filed Vitals:   02/25/15 1851 02/25/15 2025 02/25/15 2211  BP: 116/62  110/58  Pulse: 117  97  Temp: 98.4 F (36.9 C) 104 F (40 C)   TempSrc: Oral Rectal   Resp: 20  22  Height: 5\' 5"  (1.651 m)    Weight: 86.183 kg (190 lb)    SpO2: 97%  97%   General: Not in acute distress HEENT:       Eyes: PERRL, EOMI, no scleral  icterus.       ENT: No discharge from the ears and nose, no pharynx injection, no tonsillar enlargement.        Neck: No JVD, no bruit, no mass felt. Heme: No neck lymph node enlargement. Cardiac: S1/S2, RRR, No murmurs, No gallops or rubs. Pulm: No rales, wheezing, rhonchi or rubs. Abd: Soft, nondistended, nontender, no rebound pain, no organomegaly, BS present. Ext: No pitting leg edema bilaterally. 2+DP/PT pulse bilaterally.  Musculoskeletal: No joint deformities, No joint redness or warmth, no limitation of ROM in spin. Skin: No rashes.  Neuro: Alert, oriented X3, cranial nerves II-XII grossly intact, muscle strength 5/5 in all extremities, sensation to light touch intact. Brachial reflex 1+ bilaterally. Knee reflex 1+ bilaterally. Negative Babinski's sign.  Psych: Patient is not psychotic, no suicidal or hemocidal ideation.  Labs on Admission:  Basic Metabolic Panel:  Recent Labs Lab 02/25/15 1930  NA 135  K 4.4  CL 104  CO2 17*  GLUCOSE 107*  BUN 65*  CREATININE 5.80*  CALCIUM 9.3   Liver Function Tests:  Recent Labs Lab 02/25/15 1930  AST 18  ALT 12*  ALKPHOS 82  BILITOT 0.4  PROT 7.7  ALBUMIN 3.6   No results for input(s): LIPASE, AMYLASE in the last 168 hours. No results for input(s): AMMONIA in the last 168 hours. CBC:  Recent Labs Lab 02/25/15 1930  WBC 14.7*  NEUTROABS 13.2*  HGB 11.0*  HCT 35.0*  MCV 87.9  PLT 376   Cardiac Enzymes: No results for input(s): CKTOTAL, CKMB, CKMBINDEX, TROPONINI in the last 168 hours.  BNP (last 3 results) No results for input(s): BNP in the last 8760 hours.  ProBNP (last 3 results) No results for input(s): PROBNP in the last 8760 hours.  CBG: No results for input(s): GLUCAP in the last 168 hours.  Radiological Exams on Admission: Dg Chest 2 View  02/25/2015   CLINICAL DATA:  Fever.  History of CHF and atrial fibrillation.  EXAM: CHEST  2 VIEW  COMPARISON:  Chest radiograph 11/22/2014  FINDINGS: Stable  cardiomegaly. Hiatal hernia. No consolidative pulmonary opacities. No pleural effusion or pneumothorax. Cholecystectomy clips.  IMPRESSION: No acute cardiopulmonary process.   Electronically Signed   By: Lovey Newcomer M.D.   On: 02/25/2015 20:06    EKG: Independently reviewed.  Right axis deviation, no ischemic changes. Assessment/Plan Principal Problem:  UTI (lower urinary tract infection) Active Problems:   Glaucoma   Essential hypertension, benign   Atrial fibrillation   Sepsis   Acute on chronic renal failure   Chronic kidney disease, stage IV (severe)   Normocytic anemia   Fever   CAD (coronary artery disease)   Chronic diastolic CHF (congestive heart failure)   Foot cramps  UTI and sepsis secondary to UTI: Urinalysis is positive plus typical symptoms are consistently UTI. Patient does not have CVA tenderness, indicating less likely to have pyelonephritis. Patient is septic with leukocytosis, fever and tachycardia. She is hemodynamically stable on admission.  -  Admit to telemetry - CeftriaxoneIV - Follow up results of urine and blood cx and amend antibiotic regimen if needed per sensitivity results - prn Zofran for nausea - will get Procalcitonin and trend lactic acid levels per sepsis protocol. - IVF: 2L of NS bolus in ED, followed by 100 cc/h (patient has diastolic congestive heart failure, limiting aggressive IV fluids treatment). - renal US  Essential hypertension: - continue metoprolol - Hold amlodipine since pt is risk of developing hypotension -IV hydralazine when necessary  AoCKD-IV: Baseline Cre is 3.4 to 4.0, her Cre is 5.8 and BUN 65 on admission. Likely due to prerenal secondary to dehydration and UTI -  IVF as above - Check FeNa - US-renal - Follow up renal function by BMP  CAD (coronary artery disease): no chest pain. No skin change on EKG -Continue metoprolol  Chronic diastolic CHF (congestive heart failure): 2-D echo on 11/23/14 showed EF of 65-70%.  Patient does not have leg edema. CHF is compensated. Pt is not on diuretics at home. -Check BNP  Foot cramps:  -flexeril 5 mg prn tid - prn tyleol for pain   DVT ppx: SQ Heparin  Code Status: Full code Family Communication: None at bed side. Disposition Plan: Admit to inpatient   Date of Service 02/25/2015    Ivor Costa [facility] Pager [phone]  If 7PM-7AM, please contact night-coverage [email or web address] Password [email or web address] 02/25/2015, 10:40 PM

## 2015-02-26 ENCOUNTER — Encounter (HOSPITAL_COMMUNITY)
Admission: EM | Disposition: A | Discharge status: Home / Self Care | Payer: Self-pay | Source: Home / Self Care | Attending: Family Medicine

## 2015-02-26 ENCOUNTER — Encounter (HOSPITAL_COMMUNITY): Payer: Self-pay | Admitting: Certified Registered Nurse Anesthetist

## 2015-02-26 ENCOUNTER — Inpatient Hospital Stay (HOSPITAL_COMMUNITY): Payer: Medicare Other | Admitting: Anesthesiology

## 2015-02-26 ENCOUNTER — Inpatient Hospital Stay (HOSPITAL_COMMUNITY): Payer: Medicare Other

## 2015-02-26 DIAGNOSIS — N184 Chronic kidney disease, stage 4 (severe): Secondary | ICD-10-CM

## 2015-02-26 DIAGNOSIS — I2510 Atherosclerotic heart disease of native coronary artery without angina pectoris: Secondary | ICD-10-CM

## 2015-02-26 DIAGNOSIS — N189 Chronic kidney disease, unspecified: Secondary | ICD-10-CM

## 2015-02-26 DIAGNOSIS — N390 Urinary tract infection, site not specified: Secondary | ICD-10-CM

## 2015-02-26 DIAGNOSIS — A419 Sepsis, unspecified organism: Secondary | ICD-10-CM | POA: Diagnosis not present

## 2015-02-26 DIAGNOSIS — N179 Acute kidney failure, unspecified: Secondary | ICD-10-CM

## 2015-02-26 DIAGNOSIS — I10 Essential (primary) hypertension: Secondary | ICD-10-CM

## 2015-02-26 HISTORY — PX: CYSTOSCOPY WITH STENT PLACEMENT: SHX5790

## 2015-02-26 LAB — SODIUM, URINE, RANDOM: Sodium, Ur: 71 mmol/L

## 2015-02-26 LAB — BASIC METABOLIC PANEL
Anion gap: 11 (ref 5–15)
BUN: 60 mg/dL — ABNORMAL HIGH (ref 6–20)
CO2: 17 mmol/L — ABNORMAL LOW (ref 22–32)
Calcium: 8.2 mg/dL — ABNORMAL LOW (ref 8.9–10.3)
Chloride: 110 mmol/L (ref 101–111)
Creatinine, Ser: 5.8 mg/dL — ABNORMAL HIGH (ref 0.44–1.00)
GFR calc Af Amer: 8 mL/min — ABNORMAL LOW (ref >60)
GFR calc non Af Amer: 7 mL/min — ABNORMAL LOW (ref >60)
Glucose, Bld: 109 mg/dL — ABNORMAL HIGH (ref 65–99)
Potassium: 3.7 mmol/L (ref 3.5–5.1)
Sodium: 138 mmol/L (ref 135–145)

## 2015-02-26 LAB — BRAIN NATRIURETIC PEPTIDE: B Natriuretic Peptide: 35.3 pg/mL (ref 0.0–100.0)

## 2015-02-26 LAB — CBC
HCT: 27.2 % — ABNORMAL LOW (ref 36.0–46.0)
Hemoglobin: 8.6 g/dL — ABNORMAL LOW (ref 12.0–15.0)
MCH: 27.7 pg (ref 26.0–34.0)
MCHC: 31.6 g/dL (ref 30.0–36.0)
MCV: 87.5 fL (ref 78.0–100.0)
Platelets: 284 10*3/uL (ref 150–400)
RBC: 3.11 MIL/uL — ABNORMAL LOW (ref 3.87–5.11)
RDW: 14.3 % (ref 11.5–15.5)
WBC: 9 10*3/uL (ref 4.0–10.5)

## 2015-02-26 LAB — CREATININE, URINE, RANDOM: Creatinine, Urine: 72.8 mg/dL

## 2015-02-26 LAB — PROTIME-INR
INR: 1.17 (ref 0.00–1.49)
Prothrombin Time: 15.1 seconds (ref 11.6–15.2)

## 2015-02-26 LAB — LACTIC ACID, PLASMA: Lactic Acid, Venous: 0.7 mmol/L (ref 0.5–2.0)

## 2015-02-26 LAB — APTT: aPTT: 33 seconds (ref 24–37)

## 2015-02-26 LAB — PROCALCITONIN: Procalcitonin: 0.21 ng/mL

## 2015-02-26 SURGERY — CYSTOSCOPY, WITH STENT INSERTION
Anesthesia: General | Site: Ureter | Laterality: Right

## 2015-02-26 MED ORDER — HYDROMORPHONE HCL 1 MG/ML IJ SOLN
0.2500 mg | INTRAMUSCULAR | Status: DC | PRN
  Administered 2015-02-26: 0.5 mg via INTRAVENOUS

## 2015-02-26 MED ORDER — ARTIFICIAL TEARS OP OINT
TOPICAL_OINTMENT | OPHTHALMIC | Status: AC
  Filled 2015-02-26: qty 3.5

## 2015-02-26 MED ORDER — SUCCINYLCHOLINE CHLORIDE 20 MG/ML IJ SOLN
INTRAMUSCULAR | Status: DC | PRN
  Administered 2015-02-26: 100 mg via INTRAVENOUS

## 2015-02-26 MED ORDER — MIDAZOLAM HCL 2 MG/2ML IJ SOLN
INTRAMUSCULAR | Status: AC
  Filled 2015-02-26: qty 4

## 2015-02-26 MED ORDER — ONDANSETRON HCL 4 MG/2ML IJ SOLN: 4.0000 mg | Freq: Once | INTRAMUSCULAR | Status: DC | PRN

## 2015-02-26 MED ORDER — LIDOCAINE HCL (CARDIAC) 20 MG/ML IV SOLN
INTRAVENOUS | Status: AC
  Filled 2015-02-26: qty 5

## 2015-02-26 MED ORDER — FENTANYL CITRATE (PF) 100 MCG/2ML IJ SOLN
INTRAMUSCULAR | Status: AC
  Filled 2015-02-26: qty 4

## 2015-02-26 MED ORDER — ONDANSETRON HCL 4 MG/2ML IJ SOLN
INTRAMUSCULAR | Status: AC
  Filled 2015-02-26: qty 2

## 2015-02-26 MED ORDER — ONDANSETRON HCL 4 MG/2ML IJ SOLN
INTRAMUSCULAR | Status: DC | PRN
  Administered 2015-02-26: 4 mg via INTRAVENOUS

## 2015-02-26 MED ORDER — MIDAZOLAM HCL 5 MG/5ML IJ SOLN
INTRAMUSCULAR | Status: DC | PRN
  Administered 2015-02-26 (×2): 1 mg via INTRAVENOUS

## 2015-02-26 MED ORDER — PROPOFOL 10 MG/ML IV BOLUS
INTRAVENOUS | Status: DC | PRN
Start: 2015-02-26 — End: 2015-02-26
  Administered 2015-02-26: 150 mg via INTRAVENOUS

## 2015-02-26 MED ORDER — IOHEXOL 300 MG/ML  SOLN
INTRAMUSCULAR | Status: DC | PRN
  Administered 2015-02-26: 8 mL via INTRAVENOUS

## 2015-02-26 MED ORDER — FENTANYL CITRATE (PF) 100 MCG/2ML IJ SOLN
INTRAMUSCULAR | Status: DC | PRN
  Administered 2015-02-26: 50 ug via INTRAVENOUS
  Administered 2015-02-26: 100 ug via INTRAVENOUS
  Administered 2015-02-26: 50 ug via INTRAVENOUS

## 2015-02-26 MED ORDER — PROPOFOL 10 MG/ML IV BOLUS
INTRAVENOUS | Status: AC
  Filled 2015-02-26: qty 20

## 2015-02-26 MED ORDER — ACETAMINOPHEN 10 MG/ML IV SOLN
INTRAVENOUS | Status: AC
  Administered 2015-02-26: 1000 mg via INTRAVENOUS
  Filled 2015-02-26: qty 100

## 2015-02-26 MED ORDER — PHENYLEPHRINE 40 MCG/ML (10ML) SYRINGE FOR IV PUSH (FOR BLOOD PRESSURE SUPPORT)
PREFILLED_SYRINGE | INTRAVENOUS | Status: AC
  Filled 2015-02-26: qty 10

## 2015-02-26 MED ORDER — LIDOCAINE HCL (CARDIAC) 20 MG/ML IV SOLN
INTRAVENOUS | Status: DC | PRN
  Administered 2015-02-26: 100 mg via INTRAVENOUS

## 2015-02-26 MED ORDER — HYDROMORPHONE HCL 1 MG/ML IJ SOLN
INTRAMUSCULAR | Status: AC
  Administered 2015-02-27: 08:00:00
  Filled 2015-02-26: qty 1

## 2015-02-26 MED ORDER — LACTATED RINGERS IV SOLN
INTRAVENOUS | Status: DC | PRN
  Administered 2015-02-26: 19:00:00 via INTRAVENOUS

## 2015-02-26 MED ORDER — ACETAMINOPHEN 10 MG/ML IV SOLN
1000.0000 mg | Freq: Once | INTRAVENOUS | Status: AC
  Administered 2015-02-26: 1000 mg via INTRAVENOUS

## 2015-02-26 MED ORDER — MEPERIDINE HCL 25 MG/ML IJ SOLN: 6.2500 mg | INTRAMUSCULAR | Status: DC | PRN

## 2015-02-26 SURGICAL SUPPLY — 21 items
BAG URINE DRAINAGE (UROLOGICAL SUPPLIES) ×1 IMPLANT
BAG URO CATCHER STRL LF (DRAPE) ×2 IMPLANT
BASKET LASER NITINOL 1.9FR (BASKET) IMPLANT
BSKT STON RTRVL 120 1.9FR (BASKET)
CATH FOLEY 2WAY SLVR 30CC 16FR (CATHETERS) ×1 IMPLANT
CATH INTERMIT  6FR 70CM (CATHETERS) ×2 IMPLANT
CLOTH BEACON ORANGE TIMEOUT ST (SAFETY) ×2 IMPLANT
EXTRACTOR STONE NITINOL NGAGE (UROLOGICAL SUPPLIES) ×2 IMPLANT
FIBER LASER FLEXIVA 200 (UROLOGICAL SUPPLIES) IMPLANT
FIBER LASER TRAC TIP (UROLOGICAL SUPPLIES) IMPLANT
GLOVE BIO SURGEON STRL SZ8 (GLOVE) ×2 IMPLANT
GOWN STRL REUS W/TWL XL LVL3 (GOWN DISPOSABLE) ×2 IMPLANT
GUIDEWIRE STR DUAL SENSOR (WIRE) ×2 IMPLANT
IV NS 1000ML (IV SOLUTION) ×2
IV NS 1000ML BAXH (IV SOLUTION) ×1 IMPLANT
MANIFOLD NEPTUNE II (INSTRUMENTS) ×2 IMPLANT
PACK CYSTO (CUSTOM PROCEDURE TRAY) ×2 IMPLANT
STENT CONTOUR 6FRX26X.038 (STENTS) ×3 IMPLANT
SYR CONTROL 10ML LL (SYRINGE) ×2 IMPLANT
TUBE FEEDING 8FR 16IN STR KANG (MISCELLANEOUS) ×2 IMPLANT
TUBING CONNECTING 10 (TUBING) ×2 IMPLANT

## 2015-02-26 NOTE — Transfer of Care (Signed)
Immediate Anesthesia Transfer of Care Note  Patient: [name]  Procedure(s) Performed: Procedure(s): CYSTOSCOPY RETROGRADE PYELOGRAM WITH STENT PLACEMENT (Right)  Patient Location: PACU  Anesthesia Type:General  Level of Consciousness:  sedated, patient cooperative and responds to stimulation  Airway & Oxygen Therapy:Patient Spontanous Breathing and Patient connected to face mask oxgen  Post-op Assessment:  Report given to PACU RN and Post -op Vital signs reviewed and stable  Post vital signs:  Reviewed and stable  Last Vitals:  Filed Vitals:   02/26/15 1514  BP: 120/60  Pulse: 98  Temp: 36.4 C  Resp:     Complications: No apparent anesthesia complications

## 2015-02-26 NOTE — Anesthesia Postprocedure Evaluation (Signed)
Anesthesia Post Note  Patient: [name]  Procedure(s) Performed: Procedure(s) (LRB): CYSTOSCOPY RETROGRADE PYELOGRAM WITH STENT PLACEMENT (Right)  Anesthesia type: general  Patient location: PACU  Post pain: Pain level controlled  Post assessment: Patient's Cardiovascular Status Stable  Last Vitals:  Filed Vitals:   02/26/15 1514  BP: 120/60  Pulse: 98  Temp: 36.4 C  Resp:     Post vital signs: Reviewed and stable  Level of consciousness: sedated  Complications: No apparent anesthesia complications

## 2015-02-26 NOTE — Evaluation (Signed)
Physical Therapy One Time Evaluation Patient Details Name: [name] MRN: [id number] DOB: 02-Aug-1945 Today's Date: 02/26/2015   History of Present Illness  69 y.o. female with PMH of renal calculi, CKD-IV, hypertension, hyperlipidemia, GERD, CAD, MI, A fib (triggered by septic shock and resolved per pt), diastolic congestive heart failure (EF 65-70%), septic shock in the setting of pyelonephritis and obstructed renal calculi, and C. difficile colitis, and admitted with a fever, dysuria, right foot cramps.  Clinical Impression  Patient evaluated by Physical Therapy with no further acute PT needs identified. All education has been completed and the patient has no further questions.  Pt mobilizing well at this time and reports feeling at her baseline.  See below for any follow-up Physical Therapy or equipment needs. PT is signing off. Thank you for this referral.     Follow Up Recommendations No PT follow up    Equipment Recommendations  None recommended by PT    Recommendations for Other Services       Precautions / Restrictions Precautions Precautions: None Restrictions Weight Bearing Restrictions: No      Mobility  Bed Mobility Overal bed mobility: Independent                Transfers Overall transfer level: Independent                  Ambulation/Gait Ambulation/Gait assistance: Modified independent (Device/Increase time);Supervision Ambulation Distance (Feet): 300 Feet Assistive device: None Gait Pattern/deviations: Step-through pattern;Decreased stride length     General Gait Details: slow but steady pace, no LOB, pt reports feeling at her baseline  Stairs            Wheelchair Mobility    Modified Rankin (Stroke Patients Only)       Balance Overall balance assessment: No apparent balance deficits (not formally assessed) (able to perform head turn, start/stop and walk backwards without difficulty or assist)                                            Pertinent Vitals/Pain Pain Assessment: No/denies pain    Home Living Family/patient expects to be discharged to:: Private residence Living Arrangements: Children Available Help at Discharge: Family;Available 24 hours/day Type of Home: House Home Access: Stairs to enter   [employer] of Steps: 4 Home Layout: One level Home Equipment: Cane - single point;Walker - 4 wheels;Tub bench Additional Comments: can borrow bench    Prior Function Level of Independence: Independent               Hand Dominance        Extremity/Trunk Assessment   Upper Extremity Assessment: Generalized weakness           Lower Extremity Assessment: Overall WFL for tasks assessed         Communication   Communication: No difficulties  Cognition Arousal/Alertness: Awake/alert Behavior During Therapy: WFL for tasks assessed/performed Overall Cognitive Status: Within Functional Limits for tasks assessed                      General Comments      Exercises        Assessment/Plan    PT Assessment Patent does not need any further PT services  PT Diagnosis     PT Problem List    PT Treatment Interventions     PT Goals (  Current goals can be found in the Care Plan section) Acute Rehab PT Goals PT Goal Formulation: All assessment and education complete, DC therapy    Frequency     Barriers to discharge        Co-evaluation               End of Session   Activity Tolerance: Patient tolerated treatment well Patient left: in chair           Time: 1006-1026 PT Time Calculation (min) (ACUTE ONLY): 20 min   Charges:   PT Evaluation $Initial PT Evaluation Tier I: 1 Procedure     PT G Codes:        [name],[name] 02/26/2015, 1:25 PM [name], PT, DPT 02/26/2015 Pager: [phone]

## 2015-02-26 NOTE — Consult Note (Signed)
Reason for Consult: Renal Insuficiency, Febrile UTI, Nephrolithiasis, Right Hydronephrosis  Referring Physician: Georgiann Mohs MD  [name] is an 69 y.o. female.   HPI:     1 - Recurrent Nephrolithiasis -  11/2014 - right ureteroscopic stone manipulation 11/2014 by Dr. Gaynelle Arabian for right ureteral stone after prior stenting during episdoe acute renal failure.   2 - Left Renal Atrophy / Stage 4 Chronic Renal Insufficiency - Baseline Cr 1.8 pre 2016, has atrophic left kidney. Acute rise to 4.8 during episode right obstruction 10/2014. Since then Cr 3-4 range w/o hyperkalemia. She had been on some NSAIDS previously. Has seen Dr. Florene Glen previously. Most recent Cr 5.8 now in house.  3 - Metabolic Stone Disease - has never completed formal eval. Prior stone compositions Uric Acid and CaOx.  4 - Right Hydronephrosis - new and moderate right hydronephrosis by CT 02/2015 on eval worsening renal function. NO stones present.   5 - Febrile UTI - Pt currently admitted after fever to 104 and malaise at home, significant bacteruria noted on UA from ER and now on empiric Rocephin.  PMH sig for AFib, CRI. Her PCP is Lajean Manes MD with [facility].   Today Imo is seen in consultation for above.   Past Medical History  Diagnosis Date  . Hypertension   . GERD (gastroesophageal reflux disease)   . Glaucoma   . H/O renal calculi 2002 & 2006  . H/O hiatal hernia   . Headache(784.0)     migraine  . Pneumonia     dx 10-06-2014 per CXR--  on 10-27-2014 pt states finished antibiotic and denies cough or fever  . Hyperlipidemia   . History of MI (myocardial infarction)     10/ 2013 in setting of Septic Shock  . History of atrial fibrillation     10/ 2013  in setting of Septic Shock  . History of CHF (congestive heart failure)     10/ 2013 in setting of septic shock  . Sigmoid diverticulosis   . Nephrolithiasis     bilateral  . CKD (chronic kidney disease), stage III     nephrologist-  dr Florene Glen  .  Right ureteral stone   . History of acute respiratory failure     10/ 2013  -- ventilated in setting septic shock  . Dysrhythmia     Afib in 2013 when she had septic shock related to stone ureteral obstruction  . Myocardial infarction     Was reported in 2013 during hospitalization with septic shock  . Arthritis     knee's  . Foley catheter in place   . Septic shock 04/04/2012  . Sepsis     Past Surgical History  Procedure Laterality Date  . Cystoscopy w/ ureteral stent placement  04/04/2012    Procedure: CYSTOSCOPY WITH RETROGRADE PYELOGRAM/URETERAL STENT PLACEMENT;  Surgeon: Ailene Rud, MD;  Location: [facility];  Service: Urology;  Laterality: Left;  . Total abdominal hysterectomy w/ bilateral salpingoophorectomy  1993    secondary to fibroids  . Breast biopsy Left 08/23/07    benign fibrocystic with duct ectasia  . Cardiac catheterization  07-11-2012  dr Irish Lack    Abnormal stress test/   normal coronary arteries/  LVEDP  42mHg  . Cardiovascular stress test  06-26-2012  dr vIrish Lack   marked ischemia in the basal anterior, mid anterior, apical inferior regions/  normal LVF, ef 63%  . Transthoracic echocardiogram  04-09-2012    normal LVF,  ef 60-65%,  mild LAE,  mild TR, trivial MR and PR  . Cataract extraction w/ intraocular lens  implant, bilateral    . Knee arthroscopy Left 02-14-2003  . Laparoscopic cholecystectomy  03-23-2005  . Extracorporeal shock wave lithotripsy  05-28-2012  &  10-08-2012  . Cystoscopy with stent placement Right 10/28/2014    Procedure: RIGHT URETERAL STENT PLACEMENT;  Surgeon: Irine Seal, MD;  Location: [facility];  Service: Urology;  Laterality: Right;  . Cystoscopy/retrograde/ureteroscopy/stone extraction with basket Right 11/21/2014    Procedure: CYSTOSCOPY/RIGHT RETROGRADE PYELOGRAM/RIGHT URETEROSCOPY/BASKET EXTRACTION/RIGHT PYELOSCOPY/LASER OF STONE/RIGHT DOUBLE J STENT;  Surgeon: Carolan Clines, MD;  Location: [facility];  Service: Urology;  Laterality: Right;  . Holmium laser application Right 0/11/3014    Procedure: HOLMIUM LASER APPLICATION;  Surgeon: Carolan Clines, MD;  Location: [facility];  Service: Urology;  Laterality: Right;    Family History  Problem Relation Age of Onset  . Hypertension Mother   . Cancer Mother 88    breast  . Dementia Mother   . Hypertension Brother   . Diabetes Brother   . Cancer Father 30    pancreatic  . Heart failure Paternal Grandmother   . Bladder Cancer Maternal Grandfather     Social History:  reports that she has never smoked. She has never used smokeless tobacco. She reports that she does not drink alcohol or use illicit drugs.  Allergies:  Allergies  Allergen Reactions  . Vicodin [Hydrocodone-Acetaminophen] Nausea And Vomiting    Medications: I have reviewed the patient's current medications.  Results for orders placed or performed during the hospital encounter of 02/25/15 (from the past 48 hour(s))  CBC with Differential     Status: Abnormal   Collection Time: 02/25/15  7:30 PM  Result Value Ref Range   WBC 14.7 (H) 4.0 - 10.5 K/uL   RBC 3.98 3.87 - 5.11 MIL/uL   Hemoglobin 11.0 (L) 12.0 - 15.0 g/dL   HCT 35.0 (L) 36.0 - 46.0 %   MCV 87.9 78.0 - 100.0 fL   MCH 27.6 26.0 - 34.0 pg   MCHC 31.4 30.0 - 36.0 g/dL   RDW 14.0 11.5 - 15.5 %   Platelets 376 150 - 400 K/uL   Neutrophils Relative % 90 (H) 43 - 77 %   Neutro Abs 13.2 (H) 1.7 - 7.7 K/uL   Lymphocytes Relative 5 (L) 12 - 46 %   Lymphs Abs 0.7 0.7 - 4.0 K/uL   Monocytes Relative 4 3 - 12 %   Monocytes Absolute 0.7 0.1 - 1.0 K/uL   Eosinophils Relative 1 0 - 5 %   Eosinophils Absolute 0.1 0.0 - 0.7 K/uL   Basophils Relative 0 0 - 1 %   Basophils Absolute 0.0 0.0 - 0.1 K/uL  Comprehensive metabolic panel     Status: Abnormal   Collection Time: 02/25/15  7:30 PM  Result Value Ref Range   Sodium 135 135 - 145 mmol/L   Potassium 4.4 3.5 - 5.1 mmol/L    Chloride 104 101 - 111 mmol/L   CO2 17 (L) 22 - 32 mmol/L   Glucose, Bld 107 (H) 65 - 99 mg/dL   BUN 65 (H) 6 - 20 mg/dL   Creatinine, Ser 5.80 (H) 0.44 - 1.00 mg/dL   Calcium 9.3 8.9 - 10.3 mg/dL   Total Protein 7.7 6.5 - 8.1 g/dL   Albumin 3.6 3.5 - 5.0 g/dL   AST 18 15 - 41 U/L   ALT  12 (L) 14 - 54 U/L   Alkaline Phosphatase 82 38 - 126 U/L   Total Bilirubin 0.4 0.3 - 1.2 mg/dL   GFR calc non Af Amer 7 (L) >60 mL/min   GFR calc Af Amer 8 (L) >60 mL/min    Comment: (NOTE) The eGFR has been calculated using the CKD EPI equation. This calculation has not been validated in all clinical situations. eGFR's persistently <60 mL/min signify possible Chronic Kidney Disease.    Anion gap 14 5 - 15  I-Stat CG4 Lactic Acid, ED     Status: None   Collection Time: 02/25/15  7:47 PM  Result Value Ref Range   Lactic Acid, Venous 1.70 0.5 - 2.0 mmol/L  Urinalysis, Routine w reflex microscopic-may I&O cath if menses (not at [facility])     Status: Abnormal   Collection Time: 02/25/15  8:13 PM  Result Value Ref Range   Color, Urine AMBER (A) YELLOW    Comment: BIOCHEMICALS MAY BE AFFECTED BY COLOR   APPearance TURBID (A) CLEAR   Specific Gravity, Urine 1.012 1.005 - 1.030   pH 6.0 5.0 - 8.0   Glucose, UA NEGATIVE NEGATIVE mg/dL   Hgb urine dipstick LARGE (A) NEGATIVE   Bilirubin Urine NEGATIVE NEGATIVE   Ketones, ur NEGATIVE NEGATIVE mg/dL   Protein, ur 100 (A) NEGATIVE mg/dL   Urobilinogen, UA 0.2 0.0 - 1.0 mg/dL   Nitrite POSITIVE (A) NEGATIVE   Leukocytes, UA LARGE (A) NEGATIVE  Urine microscopic-add on     Status: None   Collection Time: 02/25/15  8:13 PM  Result Value Ref Range   WBC, UA TOO NUMEROUS TO COUNT <3 WBC/hpf   Urine-Other FIELD OBSCURED BY WBC'S   Culture, blood (x 2)     Status: None (Preliminary result)   Collection Time: 02/26/15 12:05 AM  Result Value Ref Range   Specimen Description BLOOD RIGHT AC    Special Requests      BOTTLES DRAWN AEROBIC ONLY 6ML Performed at  [facility]    Culture PENDING    Report Status PENDING   Lactic acid, plasma     Status: None   Collection Time: 02/26/15 12:15 AM  Result Value Ref Range   Lactic Acid, Venous 0.7 0.5 - 2.0 mmol/L  Procalcitonin     Status: None   Collection Time: 02/26/15 12:15 AM  Result Value Ref Range   Procalcitonin 0.21 ng/mL    Comment:        Interpretation: PCT (Procalcitonin) <= 0.5 ng/mL: Systemic infection (sepsis) is not likely. Local bacterial infection is possible. (NOTE)         ICU PCT Algorithm               Non ICU PCT Algorithm    ----------------------------     ------------------------------         PCT < 0.25 ng/mL                 PCT < 0.1 ng/mL     Stopping of antibiotics            Stopping of antibiotics       strongly encouraged.               strongly encouraged.    ----------------------------     ------------------------------       PCT level decrease by               PCT < 0.25 ng/mL       >=  80% from peak PCT       OR PCT 0.25 - 0.5 ng/mL          Stopping of antibiotics                                             encouraged.     Stopping of antibiotics           encouraged.    ----------------------------     ------------------------------       PCT level decrease by              PCT >= 0.25 ng/mL       < 80% from peak PCT        AND PCT >= 0.5 ng/mL            Continuin g antibiotics                                              encouraged.       Continuing antibiotics            encouraged.    ----------------------------     ------------------------------     PCT level increase compared          PCT > 0.5 ng/mL         with peak PCT AND          PCT >= 0.5 ng/mL             Escalation of antibiotics                                          strongly encouraged.      Escalation of antibiotics        strongly encouraged.   Protime-INR     Status: None   Collection Time: 02/26/15 12:15 AM  Result Value Ref Range   Prothrombin Time 15.1 11.6 -  15.2 seconds   INR 1.17 0.00 - 1.49  APTT     Status: None   Collection Time: 02/26/15 12:15 AM  Result Value Ref Range   aPTT 33 24 - 37 seconds  Brain natriuretic peptide     Status: None   Collection Time: 02/26/15  5:30 AM  Result Value Ref Range   B Natriuretic Peptide 35.3 0.0 - 100.0 pg/mL  Basic metabolic panel     Status: Abnormal   Collection Time: 02/26/15  5:30 AM  Result Value Ref Range   Sodium 138 135 - 145 mmol/L   Potassium 3.7 3.5 - 5.1 mmol/L   Chloride 110 101 - 111 mmol/L   CO2 17 (L) 22 - 32 mmol/L   Glucose, Bld 109 (H) 65 - 99 mg/dL   BUN 60 (H) 6 - 20 mg/dL   Creatinine, Ser 5.80 (H) 0.44 - 1.00 mg/dL   Calcium 8.2 (L) 8.9 - 10.3 mg/dL   GFR calc non Af Amer 7 (L) >60 mL/min   GFR calc Af Amer 8 (L) >60 mL/min    Comment: (NOTE) The eGFR has been calculated using the CKD EPI equation. This calculation has not been validated in all clinical situations. eGFR's  persistently <60 mL/min signify possible Chronic Kidney Disease.    Anion gap 11 5 - 15  CBC     Status: Abnormal   Collection Time: 02/26/15  5:30 AM  Result Value Ref Range   WBC 9.0 4.0 - 10.5 K/uL   RBC 3.11 (L) 3.87 - 5.11 MIL/uL   Hemoglobin 8.6 (L) 12.0 - 15.0 g/dL    Comment: DELTA CHECK NOTED REPEATED TO VERIFY    HCT 27.2 (L) 36.0 - 46.0 %   MCV 87.5 78.0 - 100.0 fL   MCH 27.7 26.0 - 34.0 pg   MCHC 31.6 30.0 - 36.0 g/dL   RDW 14.3 11.5 - 15.5 %   Platelets 284 150 - 400 K/uL  Creatinine, urine, random     Status: None   Collection Time: 02/26/15  6:26 AM  Result Value Ref Range   Creatinine, Urine 72.80 mg/dL    Comment: Performed at [facility]  Sodium, urine, random     Status: None   Collection Time: 02/26/15  6:26 AM  Result Value Ref Range   Sodium, Ur 71 mmol/L    Comment: Performed at [facility]    Dg Chest 2 View  02/25/2015   CLINICAL DATA:  Fever.  History of CHF and atrial fibrillation.  EXAM: CHEST  2 VIEW  COMPARISON:  Chest radiograph  11/22/2014  FINDINGS: Stable cardiomegaly. Hiatal hernia. No consolidative pulmonary opacities. No pleural effusion or pneumothorax. Cholecystectomy clips.  IMPRESSION: No acute cardiopulmonary process.   Electronically Signed   By: Lovey Newcomer M.D.   On: 02/25/2015 20:06    Review of Systems  Constitutional: Positive for fever and malaise/fatigue.  HENT: Negative.   Eyes: Negative.   Respiratory: Negative.   Cardiovascular: Negative.   Gastrointestinal: Negative.   Genitourinary: Negative.   Musculoskeletal: Negative.   Skin: Negative.   Neurological: Negative.   Endo/Heme/Allergies: Negative.   Psychiatric/Behavioral: Negative.    Blood pressure 106/59, pulse 79, temperature 98.4 F (36.9 C), temperature source Oral, resp. rate 20, height _0  (1.651 m), weight 86.183 kg (190 lb), last menstrual period 01/19/1992, SpO2 97 %. Physical Exam  Constitutional: She appears well-developed.  HENT:  Head: Normocephalic.  Eyes: Pupils are equal, round, and reactive to light.  Neck: Normal range of motion.  Cardiovascular: Normal rate.   Respiratory: Effort normal.  GI: Soft.  Genitourinary:  NO CVAT  Musculoskeletal: Normal range of motion.  Neurological: She is alert.  Skin: Skin is warm.  Psychiatric: She has a normal mood and affect. Her behavior is normal. Judgment and thought content normal.    Assessment/Plan:   1 - Recurrent Nephrolithiasis - presently stone free by imaging.   2 - Left Renal Atrophy / Stage 4 Chronic Renal Insufficiency - suspect some element of right obstruction on background of medical renal disease.   3 - Metabolic Stone Disease - would likely benefit from dedicated eval as outpatient.   4 - Right Hydronephrosis - concerning for possible stricture given h/o recent stone. Given worsened GFR and current UTI rec urgent renal decompression with right ureteral stent today, will likely need further characterization with ureteroscopy in elective setting after  completely clears infectious parameters.   Risks, benefits, alternatives discussed in detail. Will have on call MD Dr. Alyson Ingles perform today if possible.   5 - Febrile UTI - agree with current ABX pending furhter CX data. NO recent positive CX's from our office to furhter guide therapy.     Gianina Olinde  02/26/2015, 12:56 PM

## 2015-02-26 NOTE — Progress Notes (Signed)
Spoke to Dr Alyson Ingles ok to give heparin SQ

## 2015-02-26 NOTE — Brief Op Note (Signed)
02/25/2015 - 02/26/2015  7:36 PM  PATIENT:  [name]  69 y.o. female  PRE-OPERATIVE DIAGNOSIS:  right ureteral stone, rightureteral obstruction  POST-OPERATIVE DIAGNOSIS:  * No post-op diagnosis entered *  PROCEDURE:  Procedure(s): CYSTOSCOPY RETROGRADE PYELOGRAM WITH STENT PLACEMENT (Right)  SURGEON:  Surgeon(s) and Role:    * Cleon Gustin, MD - Primary  PHYSICIAN ASSISTANT:   ASSISTANTS: none   ANESTHESIA:   general  EBL:     BLOOD ADMINISTERED:none  DRAINS: Urinary Catheter (Foley) right 6x26 JJ stent  LOCAL MEDICATIONS USED:  NONE  SPECIMEN:  No Specimen  DISPOSITION OF SPECIMEN:  N/A  COUNTS:  YES  TOURNIQUET:  * No tourniquets in log *  DICTATION: .Note written in EPIC  PLAN OF CARE: Admit to inpatient   PATIENT DISPOSITION:  PACU - hemodynamically stable.   Delay start of Pharmacological VTE agent (>24hrs) due to surgical blood loss or risk of bleeding: not applicable

## 2015-02-26 NOTE — Addendum Note (Signed)
Addendum  created 02/26/15 2002 by Maxwell Caul, CRNA   Modules edited: Anesthesia Medication Administration

## 2015-02-26 NOTE — Progress Notes (Signed)
Reported to OR nurse pt states last PO's 1 acn boost at 1130 nothing else since , has bee sleeping.Voids inc when asleep.informed that she is now NPO w/ verbal understanding.

## 2015-02-26 NOTE — Anesthesia Procedure Notes (Signed)
Procedure Name: Intubation Date/Time: 02/26/2015 7:14 PM Performed by: Maxwell Caul Pre-anesthesia Checklist: Patient identified, Emergency Drugs available, Suction available and Patient being monitored Patient Re-evaluated:Patient Re-evaluated prior to inductionOxygen Delivery Method: Circle System Utilized Preoxygenation: Pre-oxygenation with 100% oxygen Intubation Type: IV induction, Rapid sequence and Cricoid Pressure applied Laryngoscope Size: Mac and 4 Grade View: Grade I Tube type: Oral Tube size: 7.5 mm Number of attempts: 1 Airway Equipment and Method: Stylet Placement Confirmation: ETT inserted through vocal cords under direct vision,  positive ETCO2 and breath sounds checked- equal and bilateral Secured at: 21 cm Tube secured with: Tape Dental Injury: Teeth and Oropharynx as per pre-operative assessment

## 2015-02-26 NOTE — Progress Notes (Signed)
Initial Nutrition Assessment  DOCUMENTATION CODES:   Non-severe (moderate) malnutrition in context of chronic illness, Obesity unspecified  INTERVENTION:  - Continue Ensure Enlive BID, each supplement provides 350 kcal and 20 grams of protein - Encourage intake of breakfast and lunch meals due to decreased appetite at these meals. - RD will continue to monitor for needs  NUTRITION DIAGNOSIS:   Unintentional weight loss related to poor appetite as evidenced by percent weight loss.  GOAL:   Patient will meet greater than or equal to 90% of their needs  MONITOR:   PO intake, Supplement acceptance, Weight trends, Labs, I & O's  REASON FOR ASSESSMENT:   Malnutrition Screening Tool  ASSESSMENT:   69 y.o. female with PMH of renal calculi, CKD-IV, hypertension, hyperlipidemia, GERD, CAD, MI, A fib (triggered by septic shock and resolved per pt), diastolic congestive heart failure (EF 65-70%), septic shock in the setting of pyelonephritis and obstructed renal calculi, and C. difficile colitis, who presents with a fever, dysuria, right foot cramps.  Pt seen for MST. BMI indicates obesity. Pt reports she ate ~50% of a bagel with cream cheese and grapes for breakfast this AM. She reports PTA a usual day for her consisted of a breakfast bar for breakfast, a light lunch, and a regular-sized dinner with a variety of foods.  She states that she was hospitalized in May and June and during that time frame lost 24 lbs. She states weight has been stable since June. Per chart review, pt has lost 30 lbs (14% body weight) since June (3 months) which is significant for time frame; no diuretics in current medication orders.  No visible upper body muscle or fat wasting but did not assess lower body.   Pt reports that she drank a full bottle of Ensure Enlive this AM and enjoyed the supplement. She has thought about purchasing supplements such as Ensure for use at home, but that her outpatient renal doctor  has expressed concern about consistently high creatinine and encouraged her to monitor her K intake. Talked with pt about this with her typical food intakes and informed her that 1 Ensure or Boost a day would likely not pose a problem. Encouraged her to drink a supplement when appetite is decreased to ensure adequate protein, vitamin, and mineral intake.  Likely not meeting needs PTA. Medications reviewed. Labs reviewed; BUN/creatinine elevated, Ca: 8.2 mg/dL, GFR: 7.   Diet Order:  Diet Heart Room service appropriate?: Yes; Fluid consistency:: Thin  Skin:  Reviewed, no issues  Last BM:  9/8  Height:   Ht Readings from Last 1 Encounters:  02/25/15 5\' 5"  (1.651 m)    Weight:   Wt Readings from Last 1 Encounters:  02/25/15 190 lb (86.183 kg)    Ideal Body Weight:  56.82 kg (kg)  BMI:  Body mass index is 31.62 kg/(m^2).  Estimated Nutritional Needs:   Kcal:  1550-1750  Protein:  70-80 grams  Fluid:  2-2.2 L/day  EDUCATION NEEDS:   No education needs identified at this time     Jarome Matin, RD, LDN Inpatient Clinical Dietitian Pager # [phone] After hours/weekend pager # [phone]

## 2015-02-26 NOTE — Anesthesia Preprocedure Evaluation (Signed)
Anesthesia Evaluation  Patient identified by MRN, date of birth, ID band Patient awake    Reviewed: Allergy & Precautions, NPO status , Patient's Chart, lab work & pertinent test results  Airway Mallampati: I  TM Distance: >3 FB Neck ROM: Full    Dental   Pulmonary    Pulmonary exam normal        Cardiovascular hypertension, Pt. on medications + CAD and + Past MI  Normal cardiovascular exam+ dysrhythmias Atrial Fibrillation      Neuro/Psych    GI/Hepatic GERD  Medicated and Controlled,  Endo/Other    Renal/GU CRFRenal disease     Musculoskeletal   Abdominal   Peds  Hematology   Anesthesia Other Findings   Reproductive/Obstetrics                             Anesthesia Physical Anesthesia Plan  ASA: III and emergent  Anesthesia Plan: General   Post-op Pain Management:    Induction: Intravenous  Airway Management Planned: LMA  Additional Equipment:   Intra-op Plan:   Post-operative Plan: Extubation in OR  Informed Consent: I have reviewed the patients History and Physical, chart, labs and discussed the procedure including the risks, benefits and alternatives for the proposed anesthesia with the patient or authorized representative who has indicated his/her understanding and acceptance.     Plan Discussed with: CRNA and Surgeon  Anesthesia Plan Comments:         Anesthesia Quick Evaluation

## 2015-02-26 NOTE — Progress Notes (Signed)
[facility] PROGRESS NOTE  [name] [id number] DOB: Apr 06, 1946 DOA: 02/25/2015 PCP: [name], MD  Assessment/Plan: 1. Sepsis due to UTI- patient came with sepsis due to UTI started on IV Rocephin. Urine culture is still pending. 2. Essential hypertension- continue metoprolol, amlodipine is on hold due to low blood pressure. 3. Acute on CKD stage IV- patient's baseline creatinine is 3.4-4. Today creatinine is 5.80. Renal ultrasound shows persistent mild bilateral hydronephrosis. Urology is following and plan for ureteral stenting today. 4. Coronary artery disease- stable, continue metoprolol 5. Chronic diastolic CHF-  compensated, not diuretics. 6. DVT prophylaxis- heparin   Code Status: Full code Family Communication: *Discussed with patient's daughter at bedside Disposition Plan: Home when medically stable   Consultants:  Urology  Procedures:  None  Antibiotics:  Ceftriaxone  HPI/Subjective: 69 y.o. female with PMH of renal calculi, CKD-IV, hypertension, hyperlipidemia, GERD, CAD, MI, A fib (triggered by septic shock and resolved per pt), diastolic congestive heart failure (EF 65-70%), septic shock in the setting of pyelonephritis and obstructed renal calculi, and C. difficile colitis, who presents with a fever, dysuria, right foot cramps. This morning patient feels better. Denies any complaints.  Objective: Filed Vitals:   02/26/15 0940  BP: 106/59  Pulse: 79  Temp:   Resp:     Intake/Output Summary (Last 24 hours) at 02/26/15 1445 Last data filed at 02/26/15 1334  Gross per 24 hour  Intake 1183.33 ml  Output      0 ml  Net 1183.33 ml   Filed Weights   02/25/15 1851 02/26/15 1400  Weight: 86.183 kg (190 lb) 91.1 kg (200 lb 13.4 oz)    Exam:   General:  Appears in no acute distress  Cardiovascular: S1-S2 regular  Respiratory: Clear to auscultation bilaterally  Abdomen: Soft, nontender, no organomegaly  Musculoskeletal: No  cyanosis/clubbing/edema of the lower extremities.   Data Reviewed: Basic Metabolic Panel:  Recent Labs Lab 02/25/15 1930 02/26/15 0530  NA 135 138  K 4.4 3.7  CL 104 110  CO2 17* 17*  GLUCOSE 107* 109*  BUN 65* 60*  CREATININE 5.80* 5.80*  CALCIUM 9.3 8.2*   Liver Function Tests:  Recent Labs Lab 02/25/15 1930  AST 18  ALT 12*  ALKPHOS 82  BILITOT 0.4  PROT 7.7  ALBUMIN 3.6   CBC:  Recent Labs Lab 02/25/15 1930 02/26/15 0530  WBC 14.7* 9.0  NEUTROABS 13.2*  --   HGB 11.0* 8.6*  HCT 35.0* 27.2*  MCV 87.9 87.5  PLT 376 284   Cardiac Enzymes: No results for input(s): CKTOTAL, CKMB, CKMBINDEX, TROPONINI in the last 168 hours. BNP (last 3 results)  Recent Labs  02/26/15 0530  BNP 35.3    ProBNP (last 3 results) No results for input(s): PROBNP in the last 8760 hours.  CBG: No results for input(s): GLUCAP in the last 168 hours.  Recent Results (from the past 240 hour(s))  Culture, blood (x 2)     Status: None (Preliminary result)   Collection Time: 02/26/15 12:05 AM  Result Value Ref Range Status   Specimen Description BLOOD RIGHT [facility]  Final   Special Requests   Final    BOTTLES DRAWN AEROBIC ONLY 6ML Performed at [facility]    Culture PENDING  Incomplete   Report Status PENDING  Incomplete     Studies: Dg Chest 2 View  02/25/2015   CLINICAL DATA:  Fever.  History of CHF and atrial fibrillation.  EXAM: CHEST  2 VIEW  COMPARISON:  Chest radiograph 11/22/2014  FINDINGS: Stable cardiomegaly. Hiatal hernia. No consolidative pulmonary opacities. No pleural effusion or pneumothorax. Cholecystectomy clips.  IMPRESSION: No acute cardiopulmonary process.   Electronically Signed   By: Lovey Newcomer M.D.   On: 02/25/2015 20:06    Scheduled Meds: . cefTRIAXone (ROCEPHIN)  IV  1 g Intravenous Q24H  . famotidine  20 mg Oral Daily  . feeding supplement (ENSURE ENLIVE)  237 mL Oral BID BM  . heparin  5,000 Units Subcutaneous 3 times per day  .  latanoprost  1 drop Both Eyes QHS  . metoprolol tartrate  25 mg Oral Daily  . timolol  1 drop Both Eyes q morning - 10a   Continuous Infusions: . sodium chloride 100 mL/hr at 02/26/15 0028    Principal Problem:   UTI (lower urinary tract infection) Active Problems:   Glaucoma   Essential hypertension, benign   Atrial fibrillation   Sepsis   Acute on chronic renal failure   Chronic kidney disease, stage IV (severe)   Normocytic anemia   Fever   CAD (coronary artery disease)   Chronic diastolic CHF (congestive heart failure)   Foot cramps   Urinary tract infection    Time spent: 25 min    [facility] Pager [phone]. If 7PM-7AM, please contact night-coverage at [email or web address], password [facility] 02/26/2015, 2:45 PM  LOS: 1 day

## 2015-02-26 NOTE — Op Note (Signed)
.  Preoperative diagnosis: right ureteral stricture, sepsis  Postoperative diagnosis: Same  Procedure: 1 cystoscopy 2. right retrograde pyelography 3.  Intraoperative fluoroscopy, under one hour, with interpretation 4. right 6 x 26 JJ stent placement  Attending: Nicolette Bang  Anesthesia: General  Estimated blood loss: None  Drains: Right 6 x 26 JJ ureteral stent without tether, 16 French foley catheter  Specimens: none  Antibiotics: rocephin  Findings:right distal ureteral stricture. Moderate hydronephrosis. No masses/lesions in the bladder. Ureteral orifices in normal anatomic location.  Indications: Patient is a 69 year old female with a history of right hydronephrosis and concern for sepsis.  After discussing treatment options, they decided proceed with right stent placement.  Procedure her in detail: The patient was brought to the operating room and a brief timeout was done to ensure correct patient, correct procedure, correct site.  General anesthesia was administered patient was placed in dorsal lithotomy position.  Their genitalia was then prepped and draped in usual sterile fashion.  A rigid 49 French cystoscope was passed in the urethra and the bladder.  Bladder was inspected free masses or lesions.  the ureteral orifices were in the normal orthotopic locations.  a 6 french ureteral catheter was then instilled into the right ureteral orifice.  a gentle retrograde was obtained and findings noted above.  we then placed a zip wire through the ureteral catheter and advanced up to the renal pelvis.    We then placed a 6 x 26 double-j ureteral stent over the original zip wire.  We then removed the wire and good coil was noted in the the renal pelvis under fluoroscopy and the bladder under direct vision.  A foley catheter was then placed. the bladder was then drained and this concluded the procedure which was well tolerated by patient.  Complications: None  Condition: Stable,  extubated, transferred to PACU  Plan: Patient is to be admitted for IV antibiotics and monitoring for post obstructive diuresis. She will be scheduled in 2 weeks for ureteroscopy and dilation of ureteral stricture

## 2015-02-26 NOTE — Evaluation (Signed)
  Occupational Therapy Evaluation Patient Details Name: [name] MRN: [id number] DOB: Nov 08, 1945 Today's Date: 02/28/2015    History of Present Illness 69 y.o. female with PMH of renal calculi, CKD-IV, hypertension, hyperlipidemia, GERD, CAD, MI, A fib (triggered by septic shock and resolved per pt), diastolic congestive heart failure (EF 65-70%), septic shock in the setting of pyelonephritis and obstructed renal calculi, and C. difficile colitis, who presents with a fever, dysuria, right foot cramps.   Clinical Impression   Pt at baseline when she does not have pain.  Did educate pt and daughter on tub bench and grab bar for tub, as well as non skid stickers for fall prevention    Follow Up Recommendations  No OT follow up    Equipment Recommendations  None recommended by OT    Recommendations for Other Services       Precautions / Restrictions Restrictions Weight Bearing Restrictions: No      Mobility Bed Mobility Overal bed mobility: Independent                Transfers Overall transfer level: Independent                         ADL Overall ADL's : At baseline                                                       Pertinent Vitals/Pain Pain Assessment: No/denies pain     Hand Dominance     Extremity/Trunk Assessment Upper Extremity Assessment Upper Extremity Assessment: Generalized weakness           Communication Communication Communication: No difficulties   Cognition Arousal/Alertness: Awake/alert Behavior During Therapy: WFL for tasks assessed/performed Overall Cognitive Status: Within Functional Limits for tasks assessed                                Home Living Family/patient expects to be discharged to:: Private residence Living Arrangements: Children Available Help at Discharge: Family;Available 24 hours/day Type of Home: House Home Access: Stairs to enter [employer] of  Steps: 4   Home Layout: One level     Bathroom Shower/Tub: Teacher, early years/pre: Standard     Home Equipment: Cane - single point;Walker - 4 wheels;Tub bench   Additional Comments: can borow bench      Prior Functioning/Environment Level of Independence: Independent                                       End of Session Nurse Communication: Mobility status  Activity Tolerance: Patient tolerated treatment well Patient left: in chair;with call bell/phone within reach;with family/visitor present   Time: [id number] OT Time Calculation (min): 31 min Charges:  OT General Charges $OT Visit: 1 Procedure OT Evaluation $Initial OT Evaluation Tier I: 1 Procedure OT Treatments $Self Care/Home Management : 8-22 mins G-Codes:    Payton Mccallum D 2015/02/28, 10:08 AM

## 2015-02-27 ENCOUNTER — Encounter (HOSPITAL_COMMUNITY): Payer: Self-pay | Admitting: Urology

## 2015-02-27 ENCOUNTER — Inpatient Hospital Stay (HOSPITAL_COMMUNITY): Payer: Medicare Other

## 2015-02-27 DIAGNOSIS — I5032 Chronic diastolic (congestive) heart failure: Secondary | ICD-10-CM

## 2015-02-27 LAB — CBC
HCT: 28.4 % — ABNORMAL LOW (ref 36.0–46.0)
Hemoglobin: 8.9 g/dL — ABNORMAL LOW (ref 12.0–15.0)
MCH: 27.9 pg (ref 26.0–34.0)
MCHC: 31.3 g/dL (ref 30.0–36.0)
MCV: 89 fL (ref 78.0–100.0)
Platelets: 267 10*3/uL (ref 150–400)
RBC: 3.19 MIL/uL — ABNORMAL LOW (ref 3.87–5.11)
RDW: 14.5 % (ref 11.5–15.5)
WBC: 9.3 10*3/uL (ref 4.0–10.5)

## 2015-02-27 LAB — BASIC METABOLIC PANEL
Anion gap: 11 (ref 5–15)
BUN: 51 mg/dL — ABNORMAL HIGH (ref 6–20)
CO2: 16 mmol/L — ABNORMAL LOW (ref 22–32)
Calcium: 8.3 mg/dL — ABNORMAL LOW (ref 8.9–10.3)
Chloride: 113 mmol/L — ABNORMAL HIGH (ref 101–111)
Creatinine, Ser: 4.73 mg/dL — ABNORMAL HIGH (ref 0.44–1.00)
GFR calc Af Amer: 10 mL/min — ABNORMAL LOW (ref >60)
GFR calc non Af Amer: 9 mL/min — ABNORMAL LOW (ref >60)
Glucose, Bld: 98 mg/dL (ref 65–99)
Potassium: 4.2 mmol/L (ref 3.5–5.1)
Sodium: 140 mmol/L (ref 135–145)

## 2015-02-27 LAB — URINE CULTURE: Culture: 5000

## 2015-02-27 MED ORDER — CALCIUM POLYCARBOPHIL 625 MG PO TABS
625.0000 mg | ORAL_TABLET | Freq: Three times a day (TID) | ORAL | Status: DC
  Filled 2015-02-27 (×3): qty 1

## 2015-02-27 NOTE — Progress Notes (Signed)
[facility] PROGRESS NOTE  THEREAS [name] DOB: 1945/10/28 DOA: 02/25/2015 PCP: [name], MD  Assessment/Plan: 1. Sepsis due to UTI- patient came with sepsis due to UTI started on IV Rocephin. Urine culture shows insignificant growth. 2. Essential hypertension- continue metoprolol, amlodipine is on hold due to low blood pressure. 3. Acute on CKD stage IV- patient's baseline creatinine is 3.4-4. Today creatinine is 4.73. Renal ultrasound shows persistent mild bilateral hydronephrosis. Urology is following  4. Coronary artery disease- stable, continue metoprolol 5. Chronic diastolic CHF-  compensated, not on diuretics. 6. DVT prophylaxis- heparin   Code Status: Full code Family Communication: *Discussed with patient's daughter at bedside on 02/26/15 Disposition Plan: Home when medically stable   Consultants:  Urology  Procedures:  None  Antibiotics:  Ceftriaxone  HPI/Subjective: 69 y.o. female with PMH of renal calculi, CKD-IV, hypertension, hyperlipidemia, GERD, CAD, MI, A fib (triggered by septic shock and resolved per pt), diastolic congestive heart failure (EF 65-70%), septic shock in the setting of pyelonephritis and obstructed renal calculi, and C. difficile colitis, who presents with a fever, dysuria, right foot cramps. This morning patient feels better. S/p right ureteral stent placement.  Objective: Filed Vitals:   02/27/15 0937  BP: 107/59  Pulse: 83  Temp:   Resp:     Intake/Output Summary (Last 24 hours) at 02/27/15 1450 Last data filed at 02/27/15 1255  Gross per 24 hour  Intake   1000 ml  Output   2150 ml  Net  -1150 ml   Filed Weights   02/25/15 1851 02/26/15 1400  Weight: 86.183 kg (190 lb) 91.1 kg (200 lb 13.4 oz)    Exam:   General:  Appears in no acute distress  Cardiovascular: S1-S2 regular  Respiratory: Clear to auscultation bilaterally  Abdomen: Soft, nontender, no organomegaly  Musculoskeletal: No  cyanosis/clubbing/edema of the lower extremities.   Data Reviewed: Basic Metabolic Panel:  Recent Labs Lab 02/25/15 1930 02/26/15 0530 02/27/15 0450  NA 135 138 140  K 4.4 3.7 4.2  CL 104 110 113*  CO2 17* 17* 16*  GLUCOSE 107* 109* 98  BUN 65* 60* 51*  CREATININE 5.80* 5.80* 4.73*  CALCIUM 9.3 8.2* 8.3*   Liver Function Tests:  Recent Labs Lab 02/25/15 1930  AST 18  ALT 12*  ALKPHOS 82  BILITOT 0.4  PROT 7.7  ALBUMIN 3.6   CBC:  Recent Labs Lab 02/25/15 1930 02/26/15 0530 02/27/15 0450  WBC 14.7* 9.0 9.3  NEUTROABS 13.2*  --   --   HGB 11.0* 8.6* 8.9*  HCT 35.0* 27.2* 28.4*  MCV 87.9 87.5 89.0  PLT 376 284 267   Cardiac Enzymes: No results for input(s): CKTOTAL, CKMB, CKMBINDEX, TROPONINI in the last 168 hours. BNP (last 3 results)  Recent Labs  02/26/15 0530  BNP 35.3    ProBNP (last 3 results) No results for input(s): PROBNP in the last 8760 hours.  CBG: No results for input(s): GLUCAP in the last 168 hours.  Recent Results (from the past 240 hour(s))  Urine culture     Status: None   Collection Time: 02/25/15  8:13 PM  Result Value Ref Range Status   Specimen Description URINE, CATHETERIZED  Final   Special Requests NONE  Final   Culture   Final    5,000 COLONIES/mL INSIGNIFICANT GROWTH Performed at [facility]    Report Status 02/27/2015 FINAL  Final  Culture, blood (x 2)     Status: None (Preliminary result)  Collection Time: 02/26/15 12:05 AM  Result Value Ref Range Status   Specimen Description BLOOD RIGHT [facility]  Final   Special Requests BOTTLES DRAWN AEROBIC ONLY 6ML  Final   Culture   Final    NO GROWTH 1 DAY Performed at [facility]    Report Status PENDING  Incomplete  Culture, blood (x 2)     Status: None (Preliminary result)   Collection Time: 02/26/15 12:15 AM  Result Value Ref Range Status   Specimen Description BLOOD RIGHT HAND  Final   Special Requests BOTTLES DRAWN AEROBIC ONLY 5CC  Final   Culture    Final    NO GROWTH 1 DAY Performed at [facility]    Report Status PENDING  Incomplete     Studies: Dg Chest 2 View  02/25/2015   CLINICAL DATA:  Fever.  History of CHF and atrial fibrillation.  EXAM: CHEST  2 VIEW  COMPARISON:  Chest radiograph 11/22/2014  FINDINGS: Stable cardiomegaly. Hiatal hernia. No consolidative pulmonary opacities. No pleural effusion or pneumothorax. Cholecystectomy clips.  IMPRESSION: No acute cardiopulmonary process.   Electronically Signed   By: Lovey Newcomer M.D.   On: 02/25/2015 20:06   US Renal  02/27/2015   CLINICAL DATA:  Acute kidney injury.  History of hydronephrosis.  EXAM: RENAL / URINARY TRACT ULTRASOUND COMPLETE  COMPARISON:  CT abdomen and pelvis 02/20/2015. Renal ultrasound 11/23/2014.  FINDINGS: Right Kidney:  Length: 12.1 cm. There is moderate right hydronephrosis. No mass is identified.  Left Kidney:  Length: 9.4 cm. There is mild left hydronephrosis. No mass is identified.  Bladder:  Foley catheter is in place.  IMPRESSION: Moderate right and mild left hydronephrosis.   Electronically Signed   By: Inge Rise M.D.   On: 02/27/2015 14:39    Scheduled Meds: . cefTRIAXone (ROCEPHIN)  IV  1 g Intravenous Q24H  . famotidine  20 mg Oral Daily  . feeding supplement (ENSURE ENLIVE)  237 mL Oral BID BM  . heparin  5,000 Units Subcutaneous 3 times per day  . latanoprost  1 drop Both Eyes QHS  . metoprolol tartrate  25 mg Oral Daily  . timolol  1 drop Both Eyes q morning - 10a   Continuous Infusions: . sodium chloride 1,000 mL (02/26/15 2017)    Principal Problem:   UTI (lower urinary tract infection) Active Problems:   Glaucoma   Essential hypertension, benign   Atrial fibrillation   Sepsis   Acute on chronic renal failure   Chronic kidney disease, stage IV (severe)   Normocytic anemia   Fever   CAD (coronary artery disease)   Chronic diastolic CHF (congestive heart failure)   Foot cramps   Urinary tract infection    Time  spent: 25 min    [facility] Pager [phone]. If 7PM-7AM, please contact night-coverage at [email or web address], password [facility] 02/27/2015, 2:50 PM  LOS: 2 days

## 2015-02-27 NOTE — Care Management Important Message (Signed)
Important Message  Patient Details  Name: [name] MRN: [id number] Date of Birth: 02-12-1946   Medicare Important Message Given:  Yes-second notification given    Camillo Flaming 02/27/2015, 3:12 [place] Message  Patient Details  Name: [name] MRN: [id number] Date of Birth: Oct 28, 1945   Medicare Important Message Given:  Yes-second notification given    Camillo Flaming 02/27/2015, 3:12 PM

## 2015-02-27 NOTE — Progress Notes (Signed)
1 Day Post-Op  Subjective:  1 - Recurrent Nephrolithiasis -  11/2014 - right ureteroscopic stone manipulation 11/2014 by Dr. Gaynelle Reilly for right ureteral stone after prior stenting during episdoe acute renal failure. Most recent imaging stone free.   2 - Left Renal Atrophy / Stage 4 Chronic Renal Insufficiency - Baseline Cr 1.8 pre 2016, has atrophic left kidney. Acute rise to 4.8 during episode right obstruction 10/2014. Since then Cr 3-4 range w/o hyperkalemia. She had been on some NSAIDS previously. Has seen Dr. Florene Reilly previously. Most recent Cr 5.8 now in house, improving s/p right ureteral stent placed 0000000.  3 - Metabolic Stone Disease - has never completed formal eval. Prior stone compositions Uric Acid and CaOx.  4 - Right Hydronephrosis - new and moderate right hydronephrosis by CT 02/2015 on eval worsening renal function. NO stones present. Underwent urgent right ureteral stent 02/26/15 by Dr. Alyson Reilly  5 - Febrile UTI - Pt currently admitted after fever to 104 and malaise at home, significant bacteruria noted on UA from ER and now on empiric Rocephin. UCX 9/7 scant growth, BCX pending.  Today [name] is stable. Fever curve trending down and Cr improved some with hydration and right stent.   Objective: Vital signs in last 24 hours: Temp:  [97.6 F (36.4 C)-102.7 F (39.3 C)] 98 F (36.7 C) (09/09 0457) Pulse Rate:  [77-109] 83 (09/09 0937) Resp:  [16-23] 16 (09/09 0457) BP: (107-125)/(52-60) 107/59 mmHg (09/09 0937) SpO2:  [97 %-100 %] 98 % (09/09 0457) Weight:  [91.1 kg (200 lb 13.4 oz)] 91.1 kg (200 lb 13.4 oz) (09/08 1400) Last BM Date: 02/26/15  Intake/Output from previous day: 09/08 0701 - 09/09 0700 In: 1240 [P.O.:240; I.V.:1000] Out: 950 [Urine:950] Intake/Output this shift:    General appearance: alert, cooperative and appears stated age Eyes: negative Nose: Nares normal. Septum midline. Mucosa normal. No drainage or sinus tenderness. Throat: lips, mucosa, and  tongue normal; teeth and gums normal Neck: no adenopathy, no carotid bruit, no JVD, supple, symmetrical, trachea midline and thyroid not enlarged, symmetric, no tenderness/mass/nodules Back: symmetric, no curvature. ROM normal. No CVA tenderness. Resp: clear to auscultation bilaterally Cardio: regular rate and rhythm, S1, S2 normal, no murmur, click, rub or gallop GI: soft, non-tender; bowel sounds normal; no masses,  no organomegaly Extremities: extremities normal, atraumatic, no cyanosis or edema Lymph nodes: Cervical, supraclavicular, and axillary nodes normal. Neurologic: Grossly normal Incision/Wound: foley with clear urine.   Lab Results:   Recent Labs  02/26/15 0530 02/27/15 0450  WBC 9.0 9.3  HGB 8.6* 8.9*  HCT 27.2* 28.4*  PLT 284 267   BMET  Recent Labs  02/26/15 0530 02/27/15 0450  NA 138 140  K 3.7 4.2  CL 110 113*  CO2 17* 16*  GLUCOSE 109* 98  BUN 60* 51*  CREATININE 5.80* 4.73*  CALCIUM 8.2* 8.3*   PT/INR  Recent Labs  02/26/15 0015  LABPROT 15.1  INR 1.17   ABG No results for input(s): PHART, HCO3 in the last 72 hours.  Invalid input(s): PCO2, PO2  Studies/Results: Dg Chest 2 View  02/25/2015   CLINICAL DATA:  Fever.  History of CHF and atrial fibrillation.  EXAM: CHEST  2 VIEW  COMPARISON:  Chest radiograph 11/22/2014  FINDINGS: Stable cardiomegaly. Hiatal hernia. No consolidative pulmonary opacities. No pleural effusion or pneumothorax. Cholecystectomy clips.  IMPRESSION: No acute cardiopulmonary process.   Electronically Signed   By: [name] M.D.   On: 02/25/2015 20:06    Anti-infectives: Anti-infectives  Start     Dose/Rate Route Frequency Ordered Stop   02/25/15 2345  cefTRIAXone (ROCEPHIN) 1 g in dextrose 5 % 50 mL IVPB     1 g 100 mL/hr over 30 Minutes Intravenous Every 24 hours 02/25/15 2339     02/25/15 2100  cefTRIAXone (ROCEPHIN) 1 g in dextrose 5 % 50 mL IVPB     1 g 100 mL/hr over 30 Minutes Intravenous  Once 02/25/15  2047 02/25/15 2131      Assessment/Plan:   1 - Recurrent Nephrolithiasis - now stone free.   2 - Left Renal Atrophy / Stage 4 Chronic Renal Insufficiency - again, likely multifactorial with some right obstruction as well as some intrinsic medical renal disease. Now s/p right JJ stent and GFR trending somewhat better.  3 - Metabolic Stone Disease - may benefit from mor completion of eval in outpatient setting.   4 - Right Hydronephrosis - now s/p right JJ stent. Etiology unclear, though concerning for possible stricture given recent history. We will arrange elective ureteroscopy in few weeks to better characterize.   5 - Febrile UTI - sawtooth fever curve with peaks getting lower expected and points to clinical improvement with likely pyelonephritis. Agree with current ABX.  Will follow.    [facility], [name] 02/27/2015

## 2015-02-28 LAB — BASIC METABOLIC PANEL
Anion gap: 7 (ref 5–15)
BUN: 39 mg/dL — ABNORMAL HIGH (ref 6–20)
CO2: 19 mmol/L — ABNORMAL LOW (ref 22–32)
Calcium: 8.4 mg/dL — ABNORMAL LOW (ref 8.9–10.3)
Chloride: 113 mmol/L — ABNORMAL HIGH (ref 101–111)
Creatinine, Ser: 3.74 mg/dL — ABNORMAL HIGH (ref 0.44–1.00)
GFR calc Af Amer: 13 mL/min — ABNORMAL LOW (ref >60)
GFR calc non Af Amer: 11 mL/min — ABNORMAL LOW (ref >60)
Glucose, Bld: 137 mg/dL — ABNORMAL HIGH (ref 65–99)
Potassium: 3.7 mmol/L (ref 3.5–5.1)
Sodium: 139 mmol/L (ref 135–145)

## 2015-02-28 LAB — C DIFFICILE QUICK SCREEN W PCR REFLEX
C Diff antigen: POSITIVE — AB
C Diff toxin: NEGATIVE

## 2015-02-28 NOTE — Progress Notes (Signed)
2 Days Post-Op  Subjective:  1 - Recurrent Nephrolithiasis -  11/2014 - right ureteroscopic stone manipulation 11/2014 by Dr. Gaynelle Arabian for right ureteral stone after prior stenting during episdoe acute renal failure. Most recent imaging stone free.   2 - Left Renal Atrophy / Stage 4 Chronic Renal Insufficiency - Baseline Cr 1.8 pre 2016, has atrophic left kidney. Acute rise to 4.8 during episode right obstruction 10/2014. Since then Cr 3-4 range w/o hyperkalemia. She had been on some NSAIDS previously. Has seen Dr. Florene Glen previously. Most recent Cr 5.8 now in house, improving s/p right ureteral stent placed [id number].  3 - Metabolic Stone Disease - has never completed formal eval. Prior stone compositions Uric Acid and CaOx.  4 - Right Hydronephrosis - new and moderate right hydronephrosis by CT 02/2015 on eval worsening renal function. NO stones present. Underwent urgent right ureteral stent 02/26/15 by Dr. Alyson Ingles  5 - Febrile UTI - Pt currently admitted after fever to 104 and malaise at home, significant bacteruria noted on UA from ER and now on empiric Rocephin. UCX 9/7 scant growth, BCX pending.  Today Charnele is stable. Fever curve continues trending down. Now new Cr this AM. No large volume post-obstructive diuresis.   Objective: Vital signs in last 24 hours: Temp:  [98.1 F (36.7 C)-99.1 F (37.3 C)] 99.1 F (37.3 C) (09/10 0531) Pulse Rate:  [72-83] 72 (09/10 0531) Resp:  [16] 16 (09/10 0531) BP: (107-129)/(54-62) 129/54 mmHg (09/10 0531) SpO2:  [97 %-100 %] 97 % (09/10 0531) Weight:  [91.3 kg (201 lb 4.5 oz)] 91.3 kg (201 lb 4.5 oz) (09/10 0531) Last BM Date: 02/26/15  Intake/Output from previous day: 09/09 0701 - 09/10 0700 In: 4900 [I.V.:4800; IV Piggyback:100] Out: 2600 [Urine:2600] Intake/Output this shift:    General appearance: alert, cooperative and appears stated age Eyes: negative Neck: supple, symmetrical, trachea midline Back: symmetric, no curvature. ROM normal.  No CVA tenderness. Resp: non-labored on room air Cardio: Nl rate GI: soft, non-tender; bowel sounds normal; no masses,  no organomegaly Pelvic: external genitalia normal and foley c/d/i with clear urine Extremities: extremities normal, atraumatic, no cyanosis or edema Lymph nodes: Cervical, supraclavicular, and axillary nodes normal. Neurologic: Grossly normal  Lab Results:   Recent Labs  02/26/15 0530 02/27/15 0450  WBC 9.0 9.3  HGB 8.6* 8.9*  HCT 27.2* 28.4*  PLT 284 267   BMET  Recent Labs  02/26/15 0530 02/27/15 0450  NA 138 140  K 3.7 4.2  CL 110 113*  CO2 17* 16*  GLUCOSE 109* 98  BUN 60* 51*  CREATININE 5.80* 4.73*  CALCIUM 8.2* 8.3*   PT/INR  Recent Labs  02/26/15 0015  LABPROT 15.1  INR 1.17   ABG No results for input(s): PHART, HCO3 in the last 72 hours.  Invalid input(s): PCO2, PO2  Studies/Results: US Renal  02/27/2015   CLINICAL DATA:  Acute kidney injury.  History of hydronephrosis.  EXAM: RENAL / URINARY TRACT ULTRASOUND COMPLETE  COMPARISON:  CT abdomen and pelvis 02/20/2015. Renal ultrasound 11/23/2014.  FINDINGS: Right Kidney:  Length: 12.1 cm. There is moderate right hydronephrosis. No mass is identified.  Left Kidney:  Length: 9.4 cm. There is mild left hydronephrosis. No mass is identified.  Bladder:  Foley catheter is in place.  IMPRESSION: Moderate right and mild left hydronephrosis.   Electronically Signed   By: Inge Rise M.D.   On: 02/27/2015 14:39    Anti-infectives: Anti-infectives    Start     Dose/Rate Route  Frequency Ordered Stop   02/25/15 2345  cefTRIAXone (ROCEPHIN) 1 g in dextrose 5 % 50 mL IVPB     1 g 100 mL/hr over 30 Minutes Intravenous Every 24 hours 02/25/15 2339     02/25/15 2100  cefTRIAXone (ROCEPHIN) 1 g in dextrose 5 % 50 mL IVPB     1 g 100 mL/hr over 30 Minutes Intravenous  Once 02/25/15 2047 02/25/15 2131      Assessment/Plan:  1 - Recurrent Nephrolithiasis - now stone free.   2 - Left Renal  Atrophy / Stage 4 Chronic Renal Insufficiency - again, likely multifactorial with some right obstruction as well as some intrinsic medical renal disease. Now s/p right JJ stent and GFR trending somewhat better.   BMP today ordered. DC foley.   3 - Metabolic Stone Disease - may benefit from mor completion of eval in outpatient setting.   4 - Right Hydronephrosis - now s/p right JJ stent. Etiology unclear, though concerning for possible stricture given recent history. We will arrange elective ureteroscopy in few weeks to better characterize.   5 - Febrile UTI - sawtooth fever curve with peaks getting lower expected and points to clinical improvement with likely pyelonephritis. Agree with current ABX. Discussed rec for fever free x 24 hrs before DC.  Will follow.   [facility], Yasuo Phimmasone 02/28/2015

## 2015-02-28 NOTE — Progress Notes (Signed)
[facility] PROGRESS NOTE  [name] [id number] DOB: May 23, 1946 DOA: 02/25/2015 PCP: [name], MD  Assessment/Plan: 1. Sepsis due to UTI- patient came with sepsis due to UTI started on IV Rocephin. Urine culture shows insignificant growth.Called and discussed with Dr Tresa Moore who recommends to continue with antibiotics. 2. Diarrhea- could be antibiotic associated, will check stool for c diff. 3. Essential hypertension- continue metoprolol, amlodipine is on hold due to low blood pressure. 4. Acute on CKD stage IV- patient's baseline creatinine is 3.4-4. Today creatinine is 3.74. Renal ultrasound shows persistent mild bilateral hydronephrosis. Urology is following  5. Coronary artery disease- stable, continue metoprolol 6. Chronic diastolic CHF-  compensated, not on diuretics. 7. DVT prophylaxis- heparin   Code Status: Full code Family Communication: *Discussed with patient's daughter at bedside on 02/26/15 Disposition Plan: Home when medically stable   Consultants:  Urology  Procedures:  None  Antibiotics:  Ceftriaxone  HPI/Subjective: 69 y.o. female with PMH of renal calculi, CKD-IV, hypertension, hyperlipidemia, GERD, CAD, MI, A fib (triggered by septic shock and resolved per pt), diastolic congestive heart failure (EF 65-70%), septic shock in the setting of pyelonephritis and obstructed renal calculi, and C. difficile colitis, who presents with a fever, dysuria, right foot cramps. This morning patient says that she developed diarrhea.  Objective: Filed Vitals:   02/28/15 0531  BP: 129/54  Pulse: 72  Temp: 99.1 F (37.3 C)  Resp: 16    Intake/Output Summary (Last 24 hours) at 02/28/15 1115 Last data filed at 02/28/15 0900  Gross per 24 hour  Intake   4900 ml  Output   3100 ml  Net   1800 ml   Filed Weights   02/25/15 1851 02/26/15 1400 02/28/15 0531  Weight: 86.183 kg (190 lb) 91.1 kg (200 lb 13.4 oz) 91.3 kg (201 lb 4.5 oz)     Exam:   General:  Appears in no acute distress  Cardiovascular: S1-S2 regular  Respiratory: Clear to auscultation bilaterally  Abdomen: Soft, nontender, no organomegaly  Musculoskeletal: No cyanosis/clubbing/edema of the lower extremities.   Data Reviewed: Basic Metabolic Panel:  Recent Labs Lab 02/25/15 1930 02/26/15 0530 02/27/15 0450 02/28/15 0904  NA 135 138 140 139  K 4.4 3.7 4.2 3.7  CL 104 110 113* 113*  CO2 17* 17* 16* 19*  GLUCOSE 107* 109* 98 137*  BUN 65* 60* 51* 39*  CREATININE 5.80* 5.80* 4.73* 3.74*  CALCIUM 9.3 8.2* 8.3* 8.4*   Liver Function Tests:  Recent Labs Lab 02/25/15 1930  AST 18  ALT 12*  ALKPHOS 82  BILITOT 0.4  PROT 7.7  ALBUMIN 3.6   CBC:  Recent Labs Lab 02/25/15 1930 02/26/15 0530 02/27/15 0450  WBC 14.7* 9.0 9.3  NEUTROABS 13.2*  --   --   HGB 11.0* 8.6* 8.9*  HCT 35.0* 27.2* 28.4*  MCV 87.9 87.5 89.0  PLT 376 284 267   Cardiac Enzymes: No results for input(s): CKTOTAL, CKMB, CKMBINDEX, TROPONINI in the last 168 hours. BNP (last 3 results)  Recent Labs  02/26/15 0530  BNP 35.3    ProBNP (last 3 results) No results for input(s): PROBNP in the last 8760 hours.  CBG: No results for input(s): GLUCAP in the last 168 hours.  Recent Results (from the past 240 hour(s))  Urine culture     Status: None   Collection Time: 02/25/15  8:13 PM  Result Value Ref Range Status   Specimen Description URINE, CATHETERIZED  Final   Special Requests NONE  Final   Culture   Final    5,000 COLONIES/mL INSIGNIFICANT GROWTH Performed at [facility]    Report Status 02/27/2015 FINAL  Final  Culture, blood (x 2)     Status: None (Preliminary result)   Collection Time: 02/26/15 12:05 AM  Result Value Ref Range Status   Specimen Description BLOOD RIGHT [facility]  Final   Special Requests BOTTLES DRAWN AEROBIC ONLY 6ML  Final   Culture   Final    NO GROWTH 2 DAYS Performed at [facility]    Report Status PENDING   Incomplete  Culture, blood (x 2)     Status: None (Preliminary result)   Collection Time: 02/26/15 12:15 AM  Result Value Ref Range Status   Specimen Description BLOOD RIGHT HAND  Final   Special Requests BOTTLES DRAWN AEROBIC ONLY 5CC  Final   Culture   Final    NO GROWTH 2 DAYS Performed at [facility]    Report Status PENDING  Incomplete     Studies: US Renal  02/27/2015   CLINICAL DATA:  Acute kidney injury.  History of hydronephrosis.  EXAM: RENAL / URINARY TRACT ULTRASOUND COMPLETE  COMPARISON:  CT abdomen and pelvis 02/20/2015. Renal ultrasound 11/23/2014.  FINDINGS: Right Kidney:  Length: 12.1 cm. There is moderate right hydronephrosis. No mass is identified.  Left Kidney:  Length: 9.4 cm. There is mild left hydronephrosis. No mass is identified.  Bladder:  Foley catheter is in place.  IMPRESSION: Moderate right and mild left hydronephrosis.   Electronically Signed   By: Inge Rise M.D.   On: 02/27/2015 14:39    Scheduled Meds: . cefTRIAXone (ROCEPHIN)  IV  1 g Intravenous Q24H  . famotidine  20 mg Oral Daily  . feeding supplement (ENSURE ENLIVE)  237 mL Oral BID BM  . heparin  5,000 Units Subcutaneous 3 times per day  . latanoprost  1 drop Both Eyes QHS  . metoprolol tartrate  25 mg Oral Daily  . timolol  1 drop Both Eyes q morning - 10a   Continuous Infusions: . sodium chloride 100 mL/hr at 02/28/15 0603    Principal Problem:   UTI (lower urinary tract infection) Active Problems:   Glaucoma   Essential hypertension, benign   Atrial fibrillation   Sepsis   Acute on chronic renal failure   Chronic kidney disease, stage IV (severe)   Normocytic anemia   Fever   CAD (coronary artery disease)   Chronic diastolic CHF (congestive heart failure)   Foot cramps   Urinary tract infection    Time spent: 25 min    [place] [facility] Pager [phone]. If 7PM-7AM, please contact night-coverage at [email or web address], password [facility] 02/28/2015,  11:15 AM  LOS: 3 days

## 2015-03-01 LAB — BASIC METABOLIC PANEL
Anion gap: 7 (ref 5–15)
BUN: 34 mg/dL — ABNORMAL HIGH (ref 6–20)
CO2: 19 mmol/L — ABNORMAL LOW (ref 22–32)
Calcium: 8.2 mg/dL — ABNORMAL LOW (ref 8.9–10.3)
Chloride: 114 mmol/L — ABNORMAL HIGH (ref 101–111)
Creatinine, Ser: 3.31 mg/dL — ABNORMAL HIGH (ref 0.44–1.00)
GFR calc Af Amer: 15 mL/min — ABNORMAL LOW (ref >60)
GFR calc non Af Amer: 13 mL/min — ABNORMAL LOW (ref >60)
Glucose, Bld: 91 mg/dL (ref 65–99)
Potassium: 3.8 mmol/L (ref 3.5–5.1)
Sodium: 140 mmol/L (ref 135–145)

## 2015-03-01 MED ORDER — CEPHALEXIN 500 MG PO CAPS: 500.0000 mg | ORAL_CAPSULE | Freq: Two times a day (BID) | ORAL | Status: DC

## 2015-03-01 NOTE — Progress Notes (Signed)
Discharge and education discussed with the patient and her daughter at bedside. All questioned fully answered. New Rx called to [facility] on [address] and available for pickup. Discharge instructions included medications, heart failure precautions, the importance of daily weights, and infection prevention.   Fritz Pickerel, RN

## 2015-03-01 NOTE — Discharge Summary (Signed)
Physician Discharge Summary  [name] [id number] DOB: 10/04/1945 DOA: 02/25/2015  PCP: [name], MD  Admit date: 02/25/2015 Discharge date: 03/01/2015  Time spent: 25* minutes  Recommendations for Outpatient Follow-up:  1. *Follow up PCP in 2 weeks  Discharge Diagnoses:  Principal Problem:   UTI (lower urinary tract infection) Active Problems:   Glaucoma   Essential hypertension, benign   Atrial fibrillation   Sepsis   Acute on chronic renal failure   Chronic kidney disease, stage IV (severe)   Normocytic anemia   Fever   CAD (coronary artery disease)   Chronic diastolic CHF (congestive heart failure)   Foot cramps   Urinary tract infection   Discharge Condition: Stable  Diet recommendation: Low salt diet  Filed Weights   02/26/15 1400 02/28/15 0531 03/01/15 0445  Weight: 91.1 kg (200 lb 13.4 oz) 91.3 kg (201 lb 4.5 oz) 90.357 kg (199 lb 3.2 oz)    History of present illness:  69 y.o. female with PMH of renal calculi, CKD-IV, hypertension, hyperlipidemia, GERD, CAD, MI, A fib (triggered by septic shock and resolved per pt), diastolic congestive heart failure (EF 65-70%), septic shock in the setting of pyelonephritis and obstructed renal calculi, and C. difficile colitis, who presents with a fever, dysuria, right foot cramps.  Hospital Course:  1. Sepsis due to UTI- patient came with sepsis due to UTI started on IV Rocephin. Urine culture shows insignificant growth.Called and discussed with Dr Tresa Moore who recommends to continue with antibiotics. Will discharge the patient home on Keflex 500 twice a day for 5 more days. 2. Diarrhea- could be antibiotic associated, stool for C. difficile was negative. 3. Essential hypertension- continue metoprolol, amlodipine  4. Acute on CKD stage IV- patient's baseline creatinine is 3.4-4. Today creatinine is 3. 31 Renal ultrasound shows persistent mild bilateral hydronephrosis. Patient underwent right ureteral stent  placement. Will follow up with urology in 3 days. Patient has an appointment on Wednesday next week. 5. Coronary artery disease- stable, continue metoprolol 6. Chronic diastolic CHF- compensated, not on diuretics.  Procedures:  s/p right JJ stent  Consultations:  Urology  Discharge Exam: Filed Vitals:   03/01/15 0445  BP: 126/77  Pulse: 76  Temp: 98.3 F (36.8 C)  Resp: 18    General: Appears in no acute distress Cardiovascular: S1-S2 regular Respiratory: Clear to auscultation bilaterally  Discharge Instructions   Discharge Instructions    Diet - low sodium heart healthy    Complete by:  As directed      Increase activity slowly    Complete by:  As directed           Current Discharge Medication List    START taking these medications   Details  cephALEXin (KEFLEX) 500 MG capsule Take 1 capsule (500 mg total) by mouth 2 (two) times daily. Qty: 10 capsule, Refills: 0      CONTINUE these medications which have NOT CHANGED   Details  acetaminophen (TYLENOL) 500 MG tablet Take 500 mg by mouth every 6 (six) hours as needed for moderate pain.    bimatoprost (LUMIGAN) 0.01 % SOLN Place 1 drop into both eyes at bedtime.    metoprolol tartrate (LOPRESSOR) 25 MG tablet Take 1 tablet (25 mg total) by mouth 2 (two) times daily. Qty: 60 tablet, Refills: 11    ranitidine (ZANTAC) 150 MG tablet Take 150 mg by mouth 2 (two) times daily as needed for heartburn.    timolol (BETIMOL) 0.5 % ophthalmic solution Place 1  drop into both eyes every morning.     amLODipine (NORVASC) 5 MG tablet Take 1 tablet (5 mg total) by mouth every evening. Qty: 30 tablet, Refills: 11    ondansetron (ZOFRAN ODT) 4 MG disintegrating tablet Take 1 tablet (4 mg total) by mouth every 8 (eight) hours as needed for nausea or vomiting. Qty: 20 tablet, Refills: 0      STOP taking these medications     fluconazole (DIFLUCAN) 100 MG tablet      metroNIDAZOLE (FLAGYL) 500 MG tablet         Allergies  Allergen Reactions  . Vicodin [Hydrocodone-Acetaminophen] Nausea And Vomiting      The results of significant diagnostics from this hospitalization (including imaging, microbiology, ancillary and laboratory) are listed below for reference.    Significant Diagnostic Studies: Dg Chest 2 View  02/25/2015   CLINICAL DATA:  Fever.  History of CHF and atrial fibrillation.  EXAM: CHEST  2 VIEW  COMPARISON:  Chest radiograph 11/22/2014  FINDINGS: Stable cardiomegaly. Hiatal hernia. No consolidative pulmonary opacities. No pleural effusion or pneumothorax. Cholecystectomy clips.  IMPRESSION: No acute cardiopulmonary process.   Electronically Signed   By: Lovey Newcomer M.D.   On: 02/25/2015 20:06   US Renal  02/27/2015   CLINICAL DATA:  Acute kidney injury.  History of hydronephrosis.  EXAM: RENAL / URINARY TRACT ULTRASOUND COMPLETE  COMPARISON:  CT abdomen and pelvis 02/20/2015. Renal ultrasound 11/23/2014.  FINDINGS: Right Kidney:  Length: 12.1 cm. There is moderate right hydronephrosis. No mass is identified.  Left Kidney:  Length: 9.4 cm. There is mild left hydronephrosis. No mass is identified.  Bladder:  Foley catheter is in place.  IMPRESSION: Moderate right and mild left hydronephrosis.   Electronically Signed   By: Inge Rise M.D.   On: 02/27/2015 14:39    Microbiology: Recent Results (from the past 240 hour(s))  Urine culture     Status: None   Collection Time: 02/25/15  8:13 PM  Result Value Ref Range Status   Specimen Description URINE, CATHETERIZED  Final   Special Requests NONE  Final   Culture   Final    5,000 COLONIES/mL INSIGNIFICANT GROWTH Performed at [facility]    Report Status 02/27/2015 FINAL  Final  Culture, blood (x 2)     Status: None (Preliminary result)   Collection Time: 02/26/15 12:05 AM  Result Value Ref Range Status   Specimen Description BLOOD RIGHT [facility]  Final   Special Requests BOTTLES DRAWN AEROBIC ONLY 6ML  Final   Culture   Final     NO GROWTH 2 DAYS Performed at [facility]    Report Status PENDING  Incomplete  Culture, blood (x 2)     Status: None (Preliminary result)   Collection Time: 02/26/15 12:15 AM  Result Value Ref Range Status   Specimen Description BLOOD RIGHT HAND  Final   Special Requests BOTTLES DRAWN AEROBIC ONLY 5CC  Final   Culture   Final    NO GROWTH 2 DAYS Performed at [facility]    Report Status PENDING  Incomplete  C difficile quick scan w PCR reflex     Status: Abnormal   Collection Time: 02/28/15 12:30 PM  Result Value Ref Range Status   C Diff antigen POSITIVE (A) NEGATIVE Final   C Diff toxin NEGATIVE NEGATIVE Final   C Diff interpretation   Final    C. difficile present, but toxin not detected. This indicates colonization. In most  cases, this does not require treatment. If patient has signs and symptoms consistent with colitis, consider treatment.     Labs: Basic Metabolic Panel:  Recent Labs Lab 02/25/15 1930 02/26/15 0530 02/27/15 0450 02/28/15 0904 03/01/15 0528  NA 135 138 140 139 140  K 4.4 3.7 4.2 3.7 3.8  CL 104 110 113* 113* 114*  CO2 17* 17* 16* 19* 19*  GLUCOSE 107* 109* 98 137* 91  BUN 65* 60* 51* 39* 34*  CREATININE 5.80* 5.80* 4.73* 3.74* 3.31*  CALCIUM 9.3 8.2* 8.3* 8.4* 8.2*   Liver Function Tests:  Recent Labs Lab 02/25/15 1930  AST 18  ALT 12*  ALKPHOS 82  BILITOT 0.4  PROT 7.7  ALBUMIN 3.6   No results for input(s): LIPASE, AMYLASE in the last 168 hours. No results for input(s): AMMONIA in the last 168 hours. CBC:  Recent Labs Lab 02/25/15 1930 02/26/15 0530 02/27/15 0450  WBC 14.7* 9.0 9.3  NEUTROABS 13.2*  --   --   HGB 11.0* 8.6* 8.9*  HCT 35.0* 27.2* 28.4*  MCV 87.9 87.5 89.0  PLT 376 284 267   Cardiac Enzymes: No results for input(s): CKTOTAL, CKMB, CKMBINDEX, TROPONINI in the last 168 hours. BNP: BNP (last 3 results)  Recent Labs  02/26/15 0530  BNP 35.3    ProBNP (last 3 results) No results for  input(s): PROBNP in the last 8760 hours.  CBG: No results for input(s): GLUCAP in the last 168 hours.     SignedEleonore Chiquito S  [facility] 03/01/2015, 1:29 PM

## 2015-03-03 LAB — CULTURE, BLOOD (ROUTINE X 2)
Culture: NO GROWTH
Culture: NO GROWTH

## 2015-03-05 ENCOUNTER — Encounter (HOSPITAL_COMMUNITY): Payer: Self-pay | Admitting: Urology

## 2015-04-06 ENCOUNTER — Emergency Department (HOSPITAL_COMMUNITY): Payer: Medicare Other

## 2015-04-06 ENCOUNTER — Encounter (HOSPITAL_COMMUNITY): Payer: Self-pay | Admitting: Nurse Practitioner

## 2015-04-06 ENCOUNTER — Inpatient Hospital Stay (HOSPITAL_COMMUNITY)
Admission: EM | Admit: 2015-04-06 | Discharge: 2015-04-16 | DRG: 683 | Disposition: A | Discharge status: Home Health | Payer: Medicare Other | Attending: Internal Medicine | Admitting: Internal Medicine

## 2015-04-06 DIAGNOSIS — N184 Chronic kidney disease, stage 4 (severe): Secondary | ICD-10-CM | POA: Diagnosis present

## 2015-04-06 DIAGNOSIS — D638 Anemia in other chronic diseases classified elsewhere: Secondary | ICD-10-CM | POA: Diagnosis present

## 2015-04-06 DIAGNOSIS — I5032 Chronic diastolic (congestive) heart failure: Secondary | ICD-10-CM | POA: Diagnosis present

## 2015-04-06 DIAGNOSIS — H409 Unspecified glaucoma: Secondary | ICD-10-CM | POA: Diagnosis present

## 2015-04-06 DIAGNOSIS — N186 End stage renal disease: Secondary | ICD-10-CM | POA: Diagnosis present

## 2015-04-06 DIAGNOSIS — E785 Hyperlipidemia, unspecified: Secondary | ICD-10-CM | POA: Diagnosis present

## 2015-04-06 DIAGNOSIS — E876 Hypokalemia: Secondary | ICD-10-CM | POA: Diagnosis not present

## 2015-04-06 DIAGNOSIS — I4891 Unspecified atrial fibrillation: Secondary | ICD-10-CM | POA: Diagnosis present

## 2015-04-06 DIAGNOSIS — E871 Hypo-osmolality and hyponatremia: Secondary | ICD-10-CM

## 2015-04-06 DIAGNOSIS — N189 Chronic kidney disease, unspecified: Secondary | ICD-10-CM

## 2015-04-06 DIAGNOSIS — N131 Hydronephrosis with ureteral stricture, not elsewhere classified: Secondary | ICD-10-CM | POA: Diagnosis present

## 2015-04-06 DIAGNOSIS — I10 Essential (primary) hypertension: Secondary | ICD-10-CM | POA: Diagnosis present

## 2015-04-06 DIAGNOSIS — K219 Gastro-esophageal reflux disease without esophagitis: Secondary | ICD-10-CM | POA: Diagnosis present

## 2015-04-06 DIAGNOSIS — N10 Acute tubulo-interstitial nephritis: Secondary | ICD-10-CM | POA: Diagnosis present

## 2015-04-06 DIAGNOSIS — N12 Tubulo-interstitial nephritis, not specified as acute or chronic: Secondary | ICD-10-CM

## 2015-04-06 DIAGNOSIS — I252 Old myocardial infarction: Secondary | ICD-10-CM | POA: Diagnosis not present

## 2015-04-06 DIAGNOSIS — R519 Headache, unspecified: Secondary | ICD-10-CM | POA: Insufficient documentation

## 2015-04-06 DIAGNOSIS — I132 Hypertensive heart and chronic kidney disease with heart failure and with stage 5 chronic kidney disease, or end stage renal disease: Secondary | ICD-10-CM | POA: Diagnosis present

## 2015-04-06 DIAGNOSIS — N179 Acute kidney failure, unspecified: Secondary | ICD-10-CM | POA: Diagnosis not present

## 2015-04-06 DIAGNOSIS — R51 Headache: Secondary | ICD-10-CM

## 2015-04-06 DIAGNOSIS — D72829 Elevated white blood cell count, unspecified: Secondary | ICD-10-CM | POA: Diagnosis not present

## 2015-04-06 DIAGNOSIS — N1330 Unspecified hydronephrosis: Secondary | ICD-10-CM

## 2015-04-06 DIAGNOSIS — I120 Hypertensive chronic kidney disease with stage 5 chronic kidney disease or end stage renal disease: Secondary | ICD-10-CM

## 2015-04-06 DIAGNOSIS — N19 Unspecified kidney failure: Secondary | ICD-10-CM | POA: Diagnosis present

## 2015-04-06 DIAGNOSIS — N185 Chronic kidney disease, stage 5: Secondary | ICD-10-CM

## 2015-04-06 LAB — COMPREHENSIVE METABOLIC PANEL
ALT: 15 U/L (ref 14–54)
AST: 17 U/L (ref 15–41)
Albumin: 3.1 g/dL — ABNORMAL LOW (ref 3.5–5.0)
Alkaline Phosphatase: 76 U/L (ref 38–126)
Anion gap: 9 (ref 5–15)
BUN: 86 mg/dL — ABNORMAL HIGH (ref 6–20)
CO2: 20 mmol/L — ABNORMAL LOW (ref 22–32)
Calcium: 9.6 mg/dL (ref 8.9–10.3)
Chloride: 106 mmol/L (ref 101–111)
Creatinine, Ser: 8.02 mg/dL — ABNORMAL HIGH (ref 0.44–1.00)
GFR calc Af Amer: 5 mL/min — ABNORMAL LOW (ref >60)
GFR calc non Af Amer: 4 mL/min — ABNORMAL LOW (ref >60)
Glucose, Bld: 119 mg/dL — ABNORMAL HIGH (ref 65–99)
Potassium: 4.3 mmol/L (ref 3.5–5.1)
Sodium: 135 mmol/L (ref 135–145)
Total Bilirubin: 0.5 mg/dL (ref 0.3–1.2)
Total Protein: 7.2 g/dL (ref 6.5–8.1)

## 2015-04-06 LAB — CBC WITH DIFFERENTIAL/PLATELET
Basophils Absolute: 0.1 10*3/uL (ref 0.0–0.1)
Basophils Relative: 0 %
Eosinophils Absolute: 0.3 10*3/uL (ref 0.0–0.7)
Eosinophils Relative: 3 %
HCT: 29.5 % — ABNORMAL LOW (ref 36.0–46.0)
Hemoglobin: 9.1 g/dL — ABNORMAL LOW (ref 12.0–15.0)
Lymphocytes Relative: 14 %
Lymphs Abs: 1.6 10*3/uL (ref 0.7–4.0)
MCH: 26.9 pg (ref 26.0–34.0)
MCHC: 30.8 g/dL (ref 30.0–36.0)
MCV: 87.3 fL (ref 78.0–100.0)
Monocytes Absolute: 0.6 10*3/uL (ref 0.1–1.0)
Monocytes Relative: 5 %
Neutro Abs: 8.7 10*3/uL — ABNORMAL HIGH (ref 1.7–7.7)
Neutrophils Relative %: 78 %
Platelets: 493 10*3/uL — ABNORMAL HIGH (ref 150–400)
RBC: 3.38 MIL/uL — ABNORMAL LOW (ref 3.87–5.11)
RDW: 16.6 % — ABNORMAL HIGH (ref 11.5–15.5)
WBC: 11.2 10*3/uL — ABNORMAL HIGH (ref 4.0–10.5)

## 2015-04-06 LAB — URINALYSIS, ROUTINE W REFLEX MICROSCOPIC
Bilirubin Urine: NEGATIVE
Glucose, UA: NEGATIVE mg/dL
Ketones, ur: NEGATIVE mg/dL
Nitrite: POSITIVE — AB
Protein, ur: 100 mg/dL — AB
Specific Gravity, Urine: 1.011 (ref 1.005–1.030)
Urobilinogen, UA: 0.2 mg/dL (ref 0.0–1.0)
pH: 6 (ref 5.0–8.0)

## 2015-04-06 LAB — URINE MICROSCOPIC-ADD ON

## 2015-04-06 MED ORDER — SODIUM CHLORIDE 0.9 % IV BOLUS (SEPSIS)
1000.0000 mL | Freq: Once | INTRAVENOUS | Status: AC
  Administered 2015-04-06: 1000 mL via INTRAVENOUS

## 2015-04-06 MED ORDER — DEXTROSE 5 % IV SOLN
1.0000 g | Freq: Once | INTRAVENOUS | Status: AC
  Administered 2015-04-06: 1 g via INTRAVENOUS
  Filled 2015-04-06: qty 10

## 2015-04-06 MED ORDER — SODIUM CHLORIDE 0.9 % IV SOLN
INTRAVENOUS | Status: DC
Start: 2015-04-06 — End: 2015-04-07
  Administered 2015-04-06: 100 mL/h via INTRAVENOUS

## 2015-04-06 NOTE — ED Notes (Signed)
Pt states he received a call from her PCP advising her to come into the ED for further evaluation after lab results drawn earlier today showed a Cr 8.0, pt reports she has a stent placed on her right kidney secondary to a kidney stone. Also c/o nausea, denies fevers or chills.

## 2015-04-06 NOTE — ED Provider Notes (Signed)
CSN: [id number]     Arrival date & time 04/06/15  2014 History   First MD Initiated Contact with Patient 04/06/15 2132     No chief complaint on file.    (Consider location/radiation/quality/duration/timing/severity/associated sxs/prior Treatment) HPI  69 year old female presents to the ER after her PCP called her with results that showed a creatinine of 8.0. One month ago she had a stent placed in a right kidney due to a kidney stone and UTI. States she has been feeling nauseated for 1 week and started having dysuria yesterday. Has had some vague left-sided back pain that feels different than prior kidney stones. Back pain is moderate and not as severe as usual for stones. No fevers. No diarrhea. Has had decreased oral intake. No fevers. Saw her urologist 3 days ago and had an elevated creatinine from baseline but wanted to get a recheck because the number did not correlate with her symptomatically. Typically her creatinine is around 3.  Past Medical History  Diagnosis Date  . Hypertension   . GERD (gastroesophageal reflux disease)   . Glaucoma   . H/O renal calculi 2002 & 2006  . H/O hiatal hernia   . Headache(784.0)     migraine  . Pneumonia     dx 10-06-2014 per CXR--  on 10-27-2014 pt states finished antibiotic and denies cough or fever  . Hyperlipidemia   . History of MI (myocardial infarction)     10/ 2013 in setting of Septic Shock  . History of atrial fibrillation     10/ 2013  in setting of Septic Shock  . History of CHF (congestive heart failure)     10/ 2013 in setting of septic shock  . Sigmoid diverticulosis   . Nephrolithiasis     bilateral  . CKD (chronic kidney disease), stage III     nephrologist-  dr Florene Glen  . Right ureteral stone   . History of acute respiratory failure     10/ 2013  -- ventilated in setting septic shock  . Dysrhythmia     Afib in 2013 when she had septic shock related to stone ureteral obstruction  . Myocardial infarction [facility])     Was  reported in 2013 during hospitalization with septic shock  . Arthritis     knee's  . Foley catheter in place   . Septic shock ([place]) 04/04/2012  . Sepsis [facility])    Past Surgical History  Procedure Laterality Date  . Cystoscopy w/ ureteral stent placement  04/04/2012    Procedure: CYSTOSCOPY WITH RETROGRADE PYELOGRAM/URETERAL STENT PLACEMENT;  Surgeon: Ailene Rud, MD;  Location: [place];  Service: Urology;  Laterality: Left;  . Total abdominal hysterectomy w/ bilateral salpingoophorectomy  1993    secondary to fibroids  . Breast biopsy Left 08/23/07    benign fibrocystic with duct ectasia  . Cardiac catheterization  07-11-2012  dr Irish Lack    Abnormal stress test/   normal coronary arteries/  LVEDP  65mmHg  . Cardiovascular stress test  06-26-2012  dr Irish Lack    marked ischemia in the basal anterior, mid anterior, apical inferior regions/  normal LVF, ef 63%  . Transthoracic echocardiogram  04-09-2012    normal LVF,  ef 60-65%,  mild LAE,  mild TR, trivial MR and PR  . Cataract extraction w/ intraocular lens  implant, bilateral    . Knee arthroscopy Left 02-14-2003  . Laparoscopic cholecystectomy  03-23-2005  . Extracorporeal shock wave lithotripsy  05-28-2012  &  10-08-2012  .  Cystoscopy with stent placement Right 10/28/2014    Procedure: RIGHT URETERAL STENT PLACEMENT;  Surgeon: Irine Seal, MD;  Location: [facility];  Service: Urology;  Laterality: Right;  . Cystoscopy/retrograde/ureteroscopy/stone extraction with basket Right 11/21/2014    Procedure: CYSTOSCOPY/RIGHT RETROGRADE PYELOGRAM/RIGHT URETEROSCOPY/BASKET EXTRACTION/RIGHT PYELOSCOPY/LASER OF STONE/RIGHT DOUBLE J STENT;  Surgeon: Carolan Clines, MD;  Location: [place];  Service: Urology;  Laterality: Right;  . Holmium laser application Right [id number]    Procedure: HOLMIUM LASER APPLICATION;  Surgeon: Carolan Clines, MD;  Location: [facility];  Service: Urology;   Laterality: Right;  . Cystoscopy with stent placement Right 02/26/2015    Procedure: CYSTOSCOPY RETROGRADE PYELOGRAM WITH STENT PLACEMENT;  Surgeon: Cleon Gustin, MD;  Location: [facility];  Service: Urology;  Laterality: Right;   Family History  Problem Relation Age of Onset  . Hypertension Mother   . Cancer Mother 42    breast  . Dementia Mother   . Hypertension Brother   . Diabetes Brother   . Cancer Father 66    pancreatic  . Heart failure Paternal Grandmother   . Bladder Cancer Maternal Grandfather    Social History  Substance Use Topics  . Smoking status: Never Smoker   . Smokeless tobacco: Never Used  . Alcohol Use: No   OB History    Gravida Para Term Preterm AB TAB SAB Ectopic Multiple Living   1 1 1       1      Review of Systems  Constitutional: Negative for fever.  Gastrointestinal: Positive for nausea. Negative for vomiting and abdominal pain.  Genitourinary: Positive for dysuria. Negative for decreased urine volume.  Musculoskeletal: Positive for back pain.  All other systems reviewed and are negative.     Allergies  Vicodin  Home Medications   Prior to Admission medications   Medication Sig Start Date End Date Taking? Authorizing Provider  acetaminophen (TYLENOL) 500 MG tablet Take 500 mg by mouth every 6 (six) hours as needed for moderate pain.   Yes Historical Provider, MD  bimatoprost (LUMIGAN) 0.01 % SOLN Place 1 drop into both eyes at bedtime.   Yes Historical Provider, MD  metoprolol tartrate (LOPRESSOR) 25 MG tablet Take 1 tablet (25 mg total) by mouth 2 (two) times daily. Patient taking differently: Take 25 mg by mouth daily.  11/21/14  Yes Jettie Booze, MD  ondansetron (ZOFRAN ODT) 4 MG disintegrating tablet Take 1 tablet (4 mg total) by mouth every 8 (eight) hours as needed for nausea or vomiting. 11/27/14  Yes Velvet Bathe, MD  ranitidine (ZANTAC) 150 MG tablet Take 150 mg by mouth 2 (two) times daily as needed for heartburn.   Yes  Historical Provider, MD  timolol (BETIMOL) 0.5 % ophthalmic solution Place 1 drop into both eyes every morning.    Yes Historical Provider, MD  amLODipine (NORVASC) 5 MG tablet Take 1 tablet (5 mg total) by mouth every evening. Patient not taking: Reported on 02/25/2015 11/21/14   Jettie Booze, MD  cephALEXin (KEFLEX) 500 MG capsule Take 1 capsule (500 mg total) by mouth 2 (two) times daily. Patient not taking: Reported on 04/06/2015 03/01/15   Oswald Hillock, MD   BP 125/69 mmHg  Pulse 84  Temp(Src) 98.1 F (36.7 C) (Oral)  Resp 16  Ht 5\' 3"  (1.6 m)  Wt 175 lb (79.379 kg)  BMI 31.01 kg/m2  SpO2 99%  LMP 01/19/1992 Physical Exam  Constitutional: She is oriented to person, place,  and time. She appears well-developed and well-nourished.  HENT:  Head: Normocephalic and atraumatic.  Right Ear: External ear normal.  Left Ear: External ear normal.  Nose: Nose normal.  Eyes: Right eye exhibits no discharge. Left eye exhibits no discharge.  Cardiovascular: Normal rate, regular rhythm and normal heart sounds.   Pulmonary/Chest: Effort normal and breath sounds normal.  Abdominal: Soft. There is no tenderness. There is no CVA tenderness.  Neurological: She is alert and oriented to person, place, and time.  Skin: Skin is warm and dry.  Nursing note and vitals reviewed.   ED Course  Procedures (including critical care time) Labs Review Labs Reviewed  CBC WITH DIFFERENTIAL/PLATELET - Abnormal; Notable for the following:    WBC 11.2 (*)    RBC 3.38 (*)    Hemoglobin 9.1 (*)    HCT 29.5 (*)    RDW 16.6 (*)    Platelets 493 (*)    Neutro Abs 8.7 (*)    All other components within normal limits  COMPREHENSIVE METABOLIC PANEL - Abnormal; Notable for the following:    CO2 20 (*)    Glucose, Bld 119 (*)    BUN 86 (*)    Creatinine, Ser 8.02 (*)    Albumin 3.1 (*)    GFR calc non Af Amer 4 (*)    GFR calc Af Amer 5 (*)    All other components within normal limits  URINALYSIS, ROUTINE  W REFLEX MICROSCOPIC (NOT AT [facility]) - Abnormal; Notable for the following:    APPearance TURBID (*)    Hgb urine dipstick MODERATE (*)    Protein, ur 100 (*)    Nitrite POSITIVE (*)    Leukocytes, UA LARGE (*)    All other components within normal limits  URINE MICROSCOPIC-ADD ON - Abnormal; Notable for the following:    Squamous Epithelial / LPF MANY (*)    Bacteria, UA MANY (*)    All other components within normal limits  URINE CULTURE    Imaging Review Ct Renal Stone Study  04/06/2015  CLINICAL DATA:  Acute onset of renal failure. Nausea. Recent placement of right ureteral stent for obstructing ureteral stone. Initial encounter. EXAM: CT ABDOMEN AND PELVIS WITHOUT CONTRAST TECHNIQUE: Multidetector CT imaging of the abdomen and pelvis was performed following the standard protocol without IV contrast. COMPARISON:  CT of the abdomen and pelvis performed 02/20/2015, and renal ultrasound performed 02/27/2015 FINDINGS: The visualized lung bases are clear. A large hiatal hernia is noted. Mild calcification is noted at the mitral valve. The liver and spleen are unremarkable in appearance. The patient is status post cholecystectomy, with clips noted at the gallbladder fossa. The pancreas and adrenal glands are unremarkable. There is mild right-sided and minimal left-sided hydronephrosis, with diffuse wall thickening at the renal calyces and pelves, extending along both ureters, and mild perinephric stranding. Findings are concerning for pyelonephritis and ureteritis. A right ureteral stent is noted in expected position. No obstructing ureteral stones are seen. There is wall thickening about the bladder, with associated soft tissue inflammation, reflecting underlying cystitis. No free fluid is identified. The small bowel is unremarkable in appearance. The stomach is within normal limits. No acute vascular abnormalities are seen. The appendix is normal in caliber, without evidence of appendicitis. Mild  scattered diverticulosis is noted along the transverse, descending and proximal sigmoid colon, without evidence of diverticulitis. There is a relatively broad-based moderate anterior abdominal wall hernia extending into the pannus, containing only fat. A small periumbilical hernia is  also noted, to the right of the umbilicus, containing only fat. The patient is status post hysterectomy. No suspicious adnexal masses are seen. No inguinal lymphadenopathy is seen. No acute osseous abnormalities are identified. There is grade 1 retrolisthesis of L2 on L3, with associated vacuum phenomenon and endplate sclerotic change, and minimal grade 1 retrolisthesis of L3 on L4. IMPRESSION: 1. Mild right-sided and minimal left-sided hydronephrosis, with diffuse wall thickening at the renal calyces and pelves, extending along both ureters, and mild perinephric stranding. Findings concerning for bilateral pyelonephritis and ureteritis, somewhat more prominent than in September. Right ureteral stent noted in expected position. No obstructing ureteral stone seen. 2. Diffuse bladder wall thickening, with associated soft tissue inflammation, concerning for underlying cystitis. 3. Large hiatal hernia noted. 4. Mild scattered diverticulosis along the transverse, descending and proximal sigmoid colon, without evidence of diverticulitis. 5. Relatively broad-based moderate anterior abdominal wall hernia extending into the pannus, containing only fat. Small periumbilical hernia to the right of the umbilicus, also containing only fat. 6. Mild degenerative change at the mid lumbar spine. Electronically Signed   By: Garald Balding M.D.   On: 04/06/2015 22:21   I have personally reviewed and evaluated these images and lab results as part of my medical decision-making.   EKG Interpretation None      MDM   Final diagnoses:  Pyelonephritis  AKI (acute kidney injury) ([place])    Patient is well-appearing. Creatinine is significantly  elevated from baseline and she does have an associated UTI. Given her back pain CT was obtained but no new hydronephrosis or stone seen. Discussed with urology on call, Dr. Junious Silk who states there is nothing more to do from a urology standpoint she may need a nephrology consult. She will be given IV fluids here and will be admitted to the hospitalist, Dr. Marily Memos to admit.    Sherwood Gambler, MD 04/06/15 [phone]

## 2015-04-06 NOTE — ED Notes (Signed)
Bed: WA03 Expected date:  Expected time:  Means of arrival:  Comments: [facility]

## 2015-04-06 NOTE — ED Notes (Signed)
Pt remains on monitor. 

## 2015-04-07 DIAGNOSIS — D72829 Elevated white blood cell count, unspecified: Secondary | ICD-10-CM | POA: Diagnosis present

## 2015-04-07 DIAGNOSIS — N184 Chronic kidney disease, stage 4 (severe): Secondary | ICD-10-CM | POA: Diagnosis present

## 2015-04-07 DIAGNOSIS — N189 Chronic kidney disease, unspecified: Secondary | ICD-10-CM

## 2015-04-07 DIAGNOSIS — N179 Acute kidney failure, unspecified: Secondary | ICD-10-CM

## 2015-04-07 DIAGNOSIS — I10 Essential (primary) hypertension: Secondary | ICD-10-CM | POA: Diagnosis present

## 2015-04-07 LAB — BASIC METABOLIC PANEL
Anion gap: 10 (ref 5–15)
BUN: 85 mg/dL — ABNORMAL HIGH (ref 6–20)
CO2: 17 mmol/L — ABNORMAL LOW (ref 22–32)
Calcium: 9.1 mg/dL (ref 8.9–10.3)
Chloride: 109 mmol/L (ref 101–111)
Creatinine, Ser: 7.35 mg/dL — ABNORMAL HIGH (ref 0.44–1.00)
GFR calc Af Amer: 6 mL/min — ABNORMAL LOW (ref >60)
GFR calc non Af Amer: 5 mL/min — ABNORMAL LOW (ref >60)
Glucose, Bld: 100 mg/dL — ABNORMAL HIGH (ref 65–99)
Potassium: 4 mmol/L (ref 3.5–5.1)
Sodium: 136 mmol/L (ref 135–145)

## 2015-04-07 LAB — CBC
HCT: 24.8 % — ABNORMAL LOW (ref 36.0–46.0)
Hemoglobin: 7.6 g/dL — ABNORMAL LOW (ref 12.0–15.0)
MCH: 26.4 pg (ref 26.0–34.0)
MCHC: 30.6 g/dL (ref 30.0–36.0)
MCV: 86.1 fL (ref 78.0–100.0)
Platelets: 418 10*3/uL — ABNORMAL HIGH (ref 150–400)
RBC: 2.88 MIL/uL — ABNORMAL LOW (ref 3.87–5.11)
RDW: 16.4 % — ABNORMAL HIGH (ref 11.5–15.5)
WBC: 11.2 10*3/uL — ABNORMAL HIGH (ref 4.0–10.5)

## 2015-04-07 MED ORDER — SACCHAROMYCES BOULARDII 250 MG PO CAPS
250.0000 mg | ORAL_CAPSULE | Freq: Two times a day (BID) | ORAL | Status: DC
  Administered 2015-04-07 – 2015-04-16 (×18): 250 mg via ORAL
  Filled 2015-04-07 (×21): qty 1

## 2015-04-07 MED ORDER — HYDRALAZINE HCL 20 MG/ML IJ SOLN: 5.0000 mg | INTRAMUSCULAR | Status: DC | PRN

## 2015-04-07 MED ORDER — TIMOLOL MALEATE 0.5 % OP SOLN
1.0000 [drp] | Freq: Every morning | OPHTHALMIC | Status: DC
  Administered 2015-04-07 – 2015-04-16 (×8): 1 [drp] via OPHTHALMIC
  Filled 2015-04-07 (×4): qty 5

## 2015-04-07 MED ORDER — DEXTROSE 5 % IV SOLN
1.0000 g | INTRAVENOUS | Status: DC
  Administered 2015-04-07 – 2015-04-10 (×4): 1 g via INTRAVENOUS
  Filled 2015-04-07 (×6): qty 10

## 2015-04-07 MED ORDER — OXYCODONE HCL 5 MG PO TABS
5.0000 mg | ORAL_TABLET | ORAL | Status: DC | PRN
  Administered 2015-04-08 – 2015-04-15 (×14): 5 mg via ORAL
  Filled 2015-04-07 (×15): qty 1

## 2015-04-07 MED ORDER — SODIUM BICARBONATE 8.4 % IV SOLN
INTRAVENOUS | Status: DC
  Administered 2015-04-07 – 2015-04-12 (×5): via INTRAVENOUS
  Filled 2015-04-07 (×13): qty 1000

## 2015-04-07 MED ORDER — HEPARIN SODIUM (PORCINE) 5000 UNIT/ML IJ SOLN
5000.0000 [IU] | Freq: Three times a day (TID) | INTRAMUSCULAR | Status: DC
  Administered 2015-04-07 – 2015-04-09 (×8): 5000 [IU] via SUBCUTANEOUS
  Filled 2015-04-07 (×11): qty 1

## 2015-04-07 MED ORDER — ONDANSETRON HCL 4 MG PO TABS: 4.0000 mg | ORAL_TABLET | Freq: Four times a day (QID) | ORAL | Status: DC | PRN

## 2015-04-07 MED ORDER — METOPROLOL TARTRATE 25 MG PO TABS
25.0000 mg | ORAL_TABLET | Freq: Every day | ORAL | Status: DC
  Administered 2015-04-07 – 2015-04-08 (×2): 25 mg via ORAL
  Filled 2015-04-07 (×2): qty 1

## 2015-04-07 MED ORDER — SODIUM CHLORIDE 0.9 % IV SOLN
INTRAVENOUS | Status: DC
  Administered 2015-04-07: 01:00:00 via INTRAVENOUS

## 2015-04-07 MED ORDER — ACETAMINOPHEN 650 MG RE SUPP: 650.0000 mg | Freq: Four times a day (QID) | RECTAL | Status: DC | PRN

## 2015-04-07 MED ORDER — ONDANSETRON HCL 4 MG/2ML IJ SOLN
4.0000 mg | Freq: Four times a day (QID) | INTRAMUSCULAR | Status: DC | PRN
  Administered 2015-04-08 – 2015-04-10 (×3): 4 mg via INTRAVENOUS
  Filled 2015-04-07 (×3): qty 2

## 2015-04-07 MED ORDER — BOOST / RESOURCE BREEZE PO LIQD
1.0000 | Freq: Three times a day (TID) | ORAL | Status: DC
  Administered 2015-04-07 – 2015-04-09 (×2): 1 via ORAL

## 2015-04-07 MED ORDER — LATANOPROST 0.005 % OP SOLN
1.0000 [drp] | Freq: Every day | OPHTHALMIC | Status: DC
  Administered 2015-04-07 – 2015-04-15 (×9): 1 [drp] via OPHTHALMIC
  Filled 2015-04-07 (×3): qty 2.5

## 2015-04-07 MED ORDER — ACETAMINOPHEN 325 MG PO TABS
650.0000 mg | ORAL_TABLET | Freq: Four times a day (QID) | ORAL | Status: DC | PRN
  Administered 2015-04-07 – 2015-04-11 (×5): 650 mg via ORAL
  Filled 2015-04-07 (×6): qty 2

## 2015-04-07 NOTE — ED Notes (Signed)
Delay in transfer upstairs, hospitalist  is with pt

## 2015-04-07 NOTE — Progress Notes (Addendum)
BP 106/58 mmHg  Pulse 83  Temp(Src) 98.1 F (36.7 C) (Oral)  Resp 18  Ht 5\' 3"  (1.6 m)  Wt 79.379 kg (175 lb)  BMI 31.01 kg/m2  SpO2 95%  LMP 01/19/1992   Acute renal failure superimposed on stage 4 chronic kidney disease baseline creatinine of 3.4-3.7 History of  Nephrolithiasis AKI (acute kidney injury)  Essential hypertension, benign;  Nonspecific abnormal unspecified cardiovascular function study;  Other primary cardiomyopathies; Atrial fibrillation ([place]); Normocytic anemia; Chronic diastolic CHF (congestive heart failure) ([place])  I agree with plan and data as per Dr. Barbaraann Faster, urology has been consulted and appreciate assistance. Patient was started on IV hydration and IV empiric rocephin antibiotics there is about improvement in his creatinine. She continues to have a mild increase in white blood cell count, there was a mild drop in hemoglobin from 9-7.6 continue to monitor hemoglobin and transfuse if symptomatic or hemoglobin less than 7. Her platelet count dropped probably due to improvement of her inflammatory status. Change IV fluids to D5 with bicarbonate, as his metabolic acidosis likely due to renal dysfunction. Continue to monitor renal function and bicarbonate.  She has been eating saltine crackers and water for the last 2 months. As she has been not feeling well nauseated. Question is do this to 2 uremic symptoms. She relates she has not vomited. I will check thiamine RBC folate and B-12 for possible malnutrition.

## 2015-04-07 NOTE — H&P (Signed)
[facility] History and Physical  [name] [id number] DOB: [birth year] DOA: 04/06/2015  Referring physician: Dr. Alfredia Ferguson PCP: [name], MD   Chief Complaint: Elevated Cr.   HPI: [name] is a 69 y.o. female  Patient was seen today by her primary care physician who drew routine labs admitting: The patient to report elevated creatinine and ask her to go to the emergency room for further evaluation. With the exception of one-week history of intermittent nausea, dysuria, and left lower back pain patient states she's been in her normal state of health and had no inkling of any underlying illness or recent echo the emergency room. Patient has not done anything for her symptoms. Symptoms are not better or worsens onset. Patient had a right ureteral stent placed on 02/26/2015. She states she's had no palpitations procedure. She sees Dr. Florene Glen is her nephrologist as outpatient.   Review of Systems:  Constitutional:  No weight loss, night sweats, Fevers, chills,  HEENT:  No headaches, Difficulty swallowing,Tooth/dental problems,Sore throat, Cardio-vascular:  No chest pain, Orthopnea, PND, swelling in lower extremities, anasarca, dizziness, palpitations  GI:  No heartburn, indigestion, abdominal pain, vomiting, diarrhea, change in bowel habits, loss of appetite  Resp:   No shortness of breath with exertion or at rest. No excess mucus, no productive cough, No non-productive cough, No coughing up of blood.No change in color of mucus.No wheezing.No chest wall deformity  Skin:  no rash or lesions.  GU:  no dysuria, change in color of urine, no urgency or frequency. No flank pain.  Musculoskeletal:   No joint pain or swelling. No decreased range of motion. Psych:  No change in mood or affect. No depression or anxiety. No memory loss.  Neuro:  No change in sensation, unilateral strength, or cognitive abilities  All other systems were reviewed and are  negative.  Past Medical History  Diagnosis Date  . Hypertension   . GERD (gastroesophageal reflux disease)   . Glaucoma   . H/O renal calculi 2002 & 2006  . H/O hiatal hernia   . Headache(784.0)     migraine  . Pneumonia     dx 10-06-2014 per CXR--  on 10-27-2014 pt states finished antibiotic and denies cough or fever  . Hyperlipidemia   . History of MI (myocardial infarction)     10/ 2013 in setting of Septic Shock  . History of atrial fibrillation     10/ 2013  in setting of Septic Shock  . History of CHF (congestive heart failure)     10/ 2013 in setting of septic shock  . Sigmoid diverticulosis   . Nephrolithiasis     bilateral  . CKD (chronic kidney disease), stage III     nephrologist-  dr Florene Glen  . Right ureteral stone   . History of acute respiratory failure     10/ 2013  -- ventilated in setting septic shock  . Dysrhythmia     Afib in 2013 when she had septic shock related to stone ureteral obstruction  . Myocardial infarction [facility])     Was reported in 2013 during hospitalization with septic shock  . Arthritis     knee's  . Foley catheter in place   . Septic shock ([place]) 04/04/2012  . Sepsis [facility])    Past Surgical History  Procedure Laterality Date  . Cystoscopy w/ ureteral stent placement  04/04/2012    Procedure: CYSTOSCOPY WITH RETROGRADE PYELOGRAM/URETERAL STENT PLACEMENT;  Surgeon: Ailene Rud, MD;  Location: [facility];  Service: Urology;  Laterality: Left;  . Total abdominal hysterectomy w/ bilateral salpingoophorectomy  1993    secondary to fibroids  . Breast biopsy Left 08/23/07    benign fibrocystic with duct ectasia  . Cardiac catheterization  07-11-2012  dr Irish Lack    Abnormal stress test/   normal coronary arteries/  LVEDP  23mmHg  . Cardiovascular stress test  06-26-2012  dr Irish Lack    marked ischemia in the basal anterior, mid anterior, apical inferior regions/  normal LVF, ef 63%  . Transthoracic echocardiogram  04-09-2012    normal LVF,   ef 60-65%,  mild LAE,  mild TR, trivial MR and PR  . Cataract extraction w/ intraocular lens  implant, bilateral    . Knee arthroscopy Left 02-14-2003  . Laparoscopic cholecystectomy  03-23-2005  . Extracorporeal shock wave lithotripsy  05-28-2012  &  10-08-2012  . Cystoscopy with stent placement Right 10/28/2014    Procedure: RIGHT URETERAL STENT PLACEMENT;  Surgeon: Irine Seal, MD;  Location: [facility];  Service: Urology;  Laterality: Right;  . Cystoscopy/retrograde/ureteroscopy/stone extraction with basket Right 11/21/2014    Procedure: CYSTOSCOPY/RIGHT RETROGRADE PYELOGRAM/RIGHT URETEROSCOPY/BASKET EXTRACTION/RIGHT PYELOSCOPY/LASER OF STONE/RIGHT DOUBLE J STENT;  Surgeon: Carolan Clines, MD;  Location: [place];  Service: Urology;  Laterality: Right;  . Holmium laser application Right [id number]    Procedure: HOLMIUM LASER APPLICATION;  Surgeon: Carolan Clines, MD;  Location[facility];  Service: Urology;  Laterality: Right;  . Cystoscopy with stent placement Right 02/26/2015    Procedure: CYSTOSCOPY RETROGRADE PYELOGRAM WITH STENT PLACEMENT;  Surgeon: Cleon Gustin, MD;  Location: [facility];  Service: Urology;  Laterality: Right;   Social History:  reports that she has never smoked. She has never used smokeless tobacco. She reports that she does not drink alcohol or use illicit drugs.  Allergies  Allergen Reactions  . Vicodin [Hydrocodone-Acetaminophen] Nausea And Vomiting    Family History  Problem Relation Age of Onset  . Hypertension Mother   . Cancer Mother 46    breast  . Dementia Mother   . Hypertension Brother   . Diabetes Brother   . Cancer Father 57    pancreatic  . Heart failure Paternal Grandmother   . Bladder Cancer Maternal Grandfather      Prior to Admission medications   Medication Sig Start Date End Date Taking? Authorizing Provider  acetaminophen (TYLENOL) 500 MG tablet Take 500 mg by mouth every 6 (six)  hours as needed for moderate pain.   Yes Historical Provider, MD  bimatoprost (LUMIGAN) 0.01 % SOLN Place 1 drop into both eyes at bedtime.   Yes Historical Provider, MD  metoprolol tartrate (LOPRESSOR) 25 MG tablet Take 1 tablet (25 mg total) by mouth 2 (two) times daily. Patient taking differently: Take 25 mg by mouth daily.  11/21/14  Yes Jettie Booze, MD  ondansetron (ZOFRAN ODT) 4 MG disintegrating tablet Take 1 tablet (4 mg total) by mouth every 8 (eight) hours as needed for nausea or vomiting. 11/27/14  Yes Velvet Bathe, MD  ranitidine (ZANTAC) 150 MG tablet Take 150 mg by mouth 2 (two) times daily as needed for heartburn.   Yes Historical Provider, MD  timolol (BETIMOL) 0.5 % ophthalmic solution Place 1 drop into both eyes every morning.    Yes Historical Provider, MD  amLODipine (NORVASC) 5 MG tablet Take 1 tablet (5 mg total) by mouth every evening. Patient not taking: Reported on 02/25/2015 11/21/14  Jettie Booze, MD  cephALEXin (KEFLEX) 500 MG capsule Take 1 capsule (500 mg total) by mouth 2 (two) times daily. Patient not taking: Reported on 04/06/2015 03/01/15   Oswald Hillock, MD   Physical Exam: Filed Vitals:   04/06/15 2300 04/06/15 2332 04/07/15 0007 04/07/15 0039  BP: 125/69 124/68 119/62 134/55  Pulse: 84 79 88 91  Temp:    98.2 F (36.8 C)  TempSrc:      Resp: 16 16 16 16   Height:      Weight:      SpO2: 99% 99% 97% 87%    Wt Readings from Last 3 Encounters:  04/06/15 79.379 kg (175 lb)  03/01/15 90.357 kg (199 lb 3.2 oz)  11/24/14 100.2 kg (220 lb 14.4 oz)    General:  Appears calm and comfortable Eyes:  PERRL, EOMI, normal lids, iris ENT:  grossly normal hearing, lips & tongue Neck:  no LAD, masses or thyromegaly Cardiovascular:  RRR, no m/r/g. No LE edema.  Respiratory:  CTA bilaterally, no w/r/r. Normal respiratory effort. Abdomen:  soft, ntnd Skin:  no rash or induration seen on limited exam Musculoskeletal:  grossly normal tone  BUE/BLE Psychiatric:  grossly normal mood and affect, speech fluent and appropriate Neurologic:  CN 2-12 grossly intact, moves all extremities in coordinated fashion.          Labs on Admission:  Basic Metabolic Panel:  Recent Labs Lab 04/06/15 2108  NA 135  K 4.3  CL 106  CO2 20*  GLUCOSE 119*  BUN 86*  CREATININE 8.02*  CALCIUM 9.6   Liver Function Tests:  Recent Labs Lab 04/06/15 2108  AST 17  ALT 15  ALKPHOS 76  BILITOT 0.5  PROT 7.2  ALBUMIN 3.1*   No results for input(s): LIPASE, AMYLASE in the last 168 hours. No results for input(s): AMMONIA in the last 168 hours. CBC:  Recent Labs Lab 04/06/15 2108  WBC 11.2*  NEUTROABS 8.7*  HGB 9.1*  HCT 29.5*  MCV 87.3  PLT 493*   Cardiac Enzymes: No results for input(s): CKTOTAL, CKMB, CKMBINDEX, TROPONINI in the last 168 hours.  BNP (last 3 results)  Recent Labs  02/26/15 0530  BNP 35.3    ProBNP (last 3 results) No results for input(s): PROBNP in the last 8760 hours.  CBG: No results for input(s): GLUCAP in the last 168 hours.  Radiological Exams on Admission: Ct Renal Stone Study  04/06/2015  CLINICAL DATA:  Acute onset of renal failure. Nausea. Recent placement of right ureteral stent for obstructing ureteral stone. Initial encounter. EXAM: CT ABDOMEN AND PELVIS WITHOUT CONTRAST TECHNIQUE: Multidetector CT imaging of the abdomen and pelvis was performed following the standard protocol without IV contrast. COMPARISON:  CT of the abdomen and pelvis performed 02/20/2015, and renal ultrasound performed 02/27/2015 FINDINGS: The visualized lung bases are clear. A large hiatal hernia is noted. Mild calcification is noted at the mitral valve. The liver and spleen are unremarkable in appearance. The patient is status post cholecystectomy, with clips noted at the gallbladder fossa. The pancreas and adrenal glands are unremarkable. There is mild right-sided and minimal left-sided hydronephrosis, with diffuse  wall thickening at the renal calyces and pelves, extending along both ureters, and mild perinephric stranding. Findings are concerning for pyelonephritis and ureteritis. A right ureteral stent is noted in expected position. No obstructing ureteral stones are seen. There is wall thickening about the bladder, with associated soft tissue inflammation, reflecting underlying cystitis. No free fluid is identified.  The small bowel is unremarkable in appearance. The stomach is within normal limits. No acute vascular abnormalities are seen. The appendix is normal in caliber, without evidence of appendicitis. Mild scattered diverticulosis is noted along the transverse, descending and proximal sigmoid colon, without evidence of diverticulitis. There is a relatively broad-based moderate anterior abdominal wall hernia extending into the pannus, containing only fat. A small periumbilical hernia is also noted, to the right of the umbilicus, containing only fat. The patient is status post hysterectomy. No suspicious adnexal masses are seen. No inguinal lymphadenopathy is seen. No acute osseous abnormalities are identified. There is grade 1 retrolisthesis of L2 on L3, with associated vacuum phenomenon and endplate sclerotic change, and minimal grade 1 retrolisthesis of L3 on L4. IMPRESSION: 1. Mild right-sided and minimal left-sided hydronephrosis, with diffuse wall thickening at the renal calyces and pelves, extending along both ureters, and mild perinephric stranding. Findings concerning for bilateral pyelonephritis and ureteritis, somewhat more prominent than in September. Right ureteral stent noted in expected position. No obstructing ureteral stone seen. 2. Diffuse bladder wall thickening, with associated soft tissue inflammation, concerning for underlying cystitis. 3. Large hiatal hernia noted. 4. Mild scattered diverticulosis along the transverse, descending and proximal sigmoid colon, without evidence of diverticulitis. 5.  Relatively broad-based moderate anterior abdominal wall hernia extending into the pannus, containing only fat. Small periumbilical hernia to the right of the umbilicus, also containing only fat. 6. Mild degenerative change at the mid lumbar spine. Electronically Signed   By: Garald Balding M.D.   On: 04/06/2015 22:21     Assessment/Plan Principal Problem:   Acute-on-chronic kidney injury [facility]) Active Problems:   Acute pyelonephritis   Bilateral hydronephrosis   CKD (chronic kidney disease) stage 4, GFR 15-29 ml/min (HCC)   Leukocytosis   Essential hypertension   AoKCD: Cr. 8 on admission. 3 days previous at urology 8.6. Baseline 3-4. Likely multifactorial from pyelo, worsening renal failure, and dehydration. R ureteral stent placed on 02/26/15 when Cr 5.8. Mild hydronephrosis noted on CT scan. Urology consulted by ED and appreciate their input. WBC 11.2, UA grossly abnormal.  - medsurge - Ceftriaxone - f/u BCX, UCX - IVF - f/u Urology consult  - Nephrology consult in am (Dr. Florene Glen is pts nephrologist)  HTN: pt not taking her Norvasc. Normotensive - hydralazine PRN   Code Status: FULL  DVT Prophylaxis: Hep Family Communication: Friend Disposition Plan:  Pending Improvement    Deaisa Merida J, MD Family Medicine [facility] [email or web address] Password [email or web address]

## 2015-04-07 NOTE — Consult Note (Signed)
Consult: Acute on chronic renal failure Requested by: Dr. Regenia Skeeter  Chief Complaint: Acute on chronic renal failure  History of Present Illness: 69 year old white female who is a patient of Dr. Gaynelle Arabian with history of nephrolithiasis, left renal atrophy, stage IV chronic renal insufficiency who developed new onset right hydronephrosis last month on CT scan without ureteral stones. She was taken and underwent cystoscopy with right ureteral stent. Unfortunately, her creatinine has continued to rise and is now 8. She was seen in the office 3 days ago and her creatinine was 8.68. She underwent CT scan of the abdomen and pelvis earlier tonight and this revealed the right ureteral stent in good position, left atrophic kidney with minimal dilation.  Currently, she is complained of migraines, nausea, mild dysuria which she said since the stent was placed and some back pain. No gross hematuria.   Past Medical History  Diagnosis Date  . Hypertension   . GERD (gastroesophageal reflux disease)   . Glaucoma   . H/O renal calculi 2002 & 2006  . H/O hiatal hernia   . Headache(784.0)     migraine  . Pneumonia     dx 10-06-2014 per CXR--  on 10-27-2014 pt states finished antibiotic and denies cough or fever  . Hyperlipidemia   . History of MI (myocardial infarction)     10/ 2013 in setting of Septic Shock  . History of atrial fibrillation     10/ 2013  in setting of Septic Shock  . History of CHF (congestive heart failure)     10/ 2013 in setting of septic shock  . Sigmoid diverticulosis   . Nephrolithiasis     bilateral  . CKD (chronic kidney disease), stage III     nephrologist-  dr Florene Glen  . Right ureteral stone   . History of acute respiratory failure     10/ 2013  -- ventilated in setting septic shock  . Dysrhythmia     Afib in 2013 when she had septic shock related to stone ureteral obstruction  . Myocardial infarction [facility])     Was reported in 2013 during hospitalization with septic  shock  . Arthritis     knee's  . Foley catheter in place   . Septic shock ([place]) 04/04/2012  . Sepsis [facility])    Past Surgical History  Procedure Laterality Date  . Cystoscopy w/ ureteral stent placement  04/04/2012    Procedure: CYSTOSCOPY WITH RETROGRADE PYELOGRAM/URETERAL STENT PLACEMENT;  Surgeon: Ailene Rud, MD;  Location: [place];  Service: Urology;  Laterality: Left;  . Total abdominal hysterectomy w/ bilateral salpingoophorectomy  1993    secondary to fibroids  . Breast biopsy Left 08/23/07    benign fibrocystic with duct ectasia  . Cardiac catheterization  07-11-2012  dr Irish Lack    Abnormal stress test/   normal coronary arteries/  LVEDP  35mmHg  . Cardiovascular stress test  06-26-2012  dr Irish Lack    marked ischemia in the basal anterior, mid anterior, apical inferior regions/  normal LVF, ef 63%  . Transthoracic echocardiogram  04-09-2012    normal LVF,  ef 60-65%,  mild LAE,  mild TR, trivial MR and PR  . Cataract extraction w/ intraocular lens  implant, bilateral    . Knee arthroscopy Left 02-14-2003  . Laparoscopic cholecystectomy  03-23-2005  . Extracorporeal shock wave lithotripsy  05-28-2012  &  10-08-2012  . Cystoscopy with stent placement Right 10/28/2014    Procedure: RIGHT URETERAL STENT PLACEMENT;  Surgeon: Irine Seal,  MD;  Location: [place];  Service: Urology;  Laterality: Right;  . Cystoscopy/retrograde/ureteroscopy/stone extraction with basket Right 11/21/2014    Procedure: CYSTOSCOPY/RIGHT RETROGRADE PYELOGRAM/RIGHT URETEROSCOPY/BASKET EXTRACTION/RIGHT PYELOSCOPY/LASER OF STONE/RIGHT DOUBLE J STENT;  Surgeon: Carolan Clines, MD;  Location: [place];  Service: Urology;  Laterality: Right;  . Holmium laser application Right [id number]    Procedure: HOLMIUM LASER APPLICATION;  Surgeon: Carolan Clines, MD;  Location: [facility];  Service: Urology;  Laterality: Right;  . Cystoscopy with stent placement  Right 02/26/2015    Procedure: CYSTOSCOPY RETROGRADE PYELOGRAM WITH STENT PLACEMENT;  Surgeon: Cleon Gustin, MD;  Location: [facility];  Service: Urology;  Laterality: Right;    Home Medications:   (Not in a hospital admission) Allergies:  Allergies  Allergen Reactions  . Vicodin [Hydrocodone-Acetaminophen] Nausea And Vomiting    Family History  Problem Relation Age of Onset  . Hypertension Mother   . Cancer Mother 34    breast  . Dementia Mother   . Hypertension Brother   . Diabetes Brother   . Cancer Father 9    pancreatic  . Heart failure Paternal Grandmother   . Bladder Cancer Maternal Grandfather    Social History:  reports that she has never smoked. She has never used smokeless tobacco. She reports that she does not drink alcohol or use illicit drugs.  ROS: A complete review of systems was performed.  All systems are negative except for pertinent findings as noted. Review of Systems  All other systems reviewed and are negative.    Physical Exam:  Vital signs in last 24 hours: Temp:  [98.1 F (36.7 C)] 98.1 F (36.7 C) (10/17 2020) Pulse Rate:  [79-93] 88 (10/18 0007) Resp:  [14-16] 16 (10/18 0007) BP: (108-125)/(62-70) 119/62 mmHg (10/18 0007) SpO2:  [94 %-99 %] 97 % (10/18 0007) Weight:  [79.379 kg (175 lb)] 79.379 kg (175 lb) (10/17 2020) General:  Alert and oriented, No acute distress HEENT: Normocephalic, atraumatic Neck: No JVD or lymphadenopathy Cardiovascular: Regular rate and rhythm Lungs: Regular rate and effort Abdomen: Soft, nontender, nondistended, no abdominal masses Back: No CVA tenderness Extremities: No edema Neurologic: Grossly intact  Laboratory Data:  Results for orders placed or performed during the hospital encounter of 04/06/15 (from the past 24 hour(s))  CBC with Differential     Status: Abnormal   Collection Time: 04/06/15  9:08 PM  Result Value Ref Range   WBC 11.2 (H) 4.0 - 10.5 K/uL   RBC 3.38 (L) 3.87 - 5.11 MIL/uL    Hemoglobin 9.1 (L) 12.0 - 15.0 g/dL   HCT 29.5 (L) 36.0 - 46.0 %   MCV 87.3 78.0 - 100.0 fL   MCH 26.9 26.0 - 34.0 pg   MCHC 30.8 30.0 - 36.0 g/dL   RDW 16.6 (H) 11.5 - 15.5 %   Platelets 493 (H) 150 - 400 K/uL   Neutrophils Relative % 78 %   Neutro Abs 8.7 (H) 1.7 - 7.7 K/uL   Lymphocytes Relative 14 %   Lymphs Abs 1.6 0.7 - 4.0 K/uL   Monocytes Relative 5 %   Monocytes Absolute 0.6 0.1 - 1.0 K/uL   Eosinophils Relative 3 %   Eosinophils Absolute 0.3 0.0 - 0.7 K/uL   Basophils Relative 0 %   Basophils Absolute 0.1 0.0 - 0.1 K/uL  Comprehensive metabolic panel     Status: Abnormal   Collection Time: 04/06/15  9:08 PM  Result Value Ref Range   Sodium  135 135 - 145 mmol/L   Potassium 4.3 3.5 - 5.1 mmol/L   Chloride 106 101 - 111 mmol/L   CO2 20 (L) 22 - 32 mmol/L   Glucose, Bld 119 (H) 65 - 99 mg/dL   BUN 86 (H) 6 - 20 mg/dL   Creatinine, Ser 8.02 (H) 0.44 - 1.00 mg/dL   Calcium 9.6 8.9 - 10.3 mg/dL   Total Protein 7.2 6.5 - 8.1 g/dL   Albumin 3.1 (L) 3.5 - 5.0 g/dL   AST 17 15 - 41 U/L   ALT 15 14 - 54 U/L   Alkaline Phosphatase 76 38 - 126 U/L   Total Bilirubin 0.5 0.3 - 1.2 mg/dL   GFR calc non Af Amer 4 (L) >60 mL/min   GFR calc Af Amer 5 (L) >60 mL/min   Anion gap 9 5 - 15  Urinalysis, Routine w reflex microscopic (not at [facility])     Status: Abnormal   Collection Time: 04/06/15  9:46 PM  Result Value Ref Range   Color, Urine YELLOW YELLOW   APPearance TURBID (A) CLEAR   Specific Gravity, Urine 1.011 1.005 - 1.030   pH 6.0 5.0 - 8.0   Glucose, UA NEGATIVE NEGATIVE mg/dL   Hgb urine dipstick MODERATE (A) NEGATIVE   Bilirubin Urine NEGATIVE NEGATIVE   Ketones, ur NEGATIVE NEGATIVE mg/dL   Protein, ur 100 (A) NEGATIVE mg/dL   Urobilinogen, UA 0.2 0.0 - 1.0 mg/dL   Nitrite POSITIVE (A) NEGATIVE   Leukocytes, UA LARGE (A) NEGATIVE  Urine microscopic-add on     Status: Abnormal   Collection Time: 04/06/15  9:46 PM  Result Value Ref Range   Squamous Epithelial / LPF  MANY (A) RARE   WBC, UA TOO NUMEROUS TO COUNT <3 WBC/hpf   RBC / HPF 3-6 <3 RBC/hpf   Bacteria, UA MANY (A) RARE   No results found for this or any previous visit (from the past 240 hour(s)). Creatinine:  Recent Labs  04/06/15 2108  CREATININE 8.02*    Impression/Assessment/plan: Acute on chronic renal failure- patient status post right stent recently, it is in good position. Mild left dilation. I'm not certain stent change plus/minus placement of a left stent would improve her situation. She did not improve after the stent was placed. Agree with medical management and will follow creatinine.  UTI - U/A findings could simply be from ureteral stent, but given slight elevation in wbc agree with tx for UTI.   Will follow. I'll notify Dr. Gaynelle Arabian of admission.     Judy Pollman 04/07/2015, 12:21 AM

## 2015-04-07 NOTE — Progress Notes (Signed)
Initial Nutrition Assessment  DOCUMENTATION CODES:   Not applicable  INTERVENTION:  Boost Breeze po TID, each supplement provides 250 kcal and 9 grams of protein   NUTRITION DIAGNOSIS:   Inadequate oral intake related to poor appetite, chronic illness as evidenced by per patient/family report.   GOAL:   Patient will meet greater than or equal to 90% of their needs   MONITOR:   PO intake, Diet advancement, Labs, I & O's, Skin  REASON FOR ASSESSMENT:   Malnutrition Screening Tool    ASSESSMENT:  [name] is a 69 y.o. female  Patient was seen today by her primary care physician who drew routine labs admitting: The patient to report elevated creatinine and ask her to go to the emergency room for further evaluation. With the exception of one-week history of intermittent nausea, dysuria, and left lower back pain patient states she's been in her normal state of health and had no inkling of any underlying illness or recent echo the emergency room. Patient has not done anything for her symptoms. Symptoms are not better or worsens onset. Patient had a right ureteral stent placed on 02/26/2015. She states she's had no palpitations procedure. She sees Dr. Florene Glen is her nephrologist as outpatient.  Pt reports poor appetite since hospitlization in April. Pt's sister reports she will eat a sleeve of saltine crackers throughout the day and that will be it. Pt has CKD Stg III. 46#/20% severe wt loss, though fluid is possible culprit alongside muscle and fat loss. Could not conduct NFPA due to edema. Will provide boost breeze x3 for extra calories during stay. Pt drinks ensure at home, but says that ensure plus made her feel bloated during last hospitilization.    Diet Order:  Diet Heart Room service appropriate?: Yes; Fluid consistency:: Thin  Skin:  Reviewed, no issues  Last BM:  PTA  Height:   Ht Readings from Last 1 Encounters:  04/06/15 5\' 3"  (1.6 m)    Weight:   Wt  Readings from Last 1 Encounters:  04/06/15 175 lb (79.379 kg)    Ideal Body Weight:  52.27 kg  BMI:  Body mass index is 31.01 kg/(m^2).  Estimated Nutritional Needs:   Kcal:  1500-1700  Protein:  50-60 grams  Fluid:  Per doctor's discretion  EDUCATION NEEDS:   No education needs identified at this time  Satira Anis. Zalia Hautala, MS, RD LDN After Hours/Weekend Pager [phone]

## 2015-04-08 ENCOUNTER — Inpatient Hospital Stay (HOSPITAL_COMMUNITY): Payer: Medicare Other

## 2015-04-08 LAB — IRON AND TIBC
Iron: 34 ug/dL (ref 28–170)
Saturation Ratios: 16 % (ref 10.4–31.8)
TIBC: 211 ug/dL — ABNORMAL LOW (ref 250–450)
UIBC: 177 ug/dL

## 2015-04-08 LAB — RETICULOCYTES
RBC.: 2.71 MIL/uL — ABNORMAL LOW (ref 3.87–5.11)
Retic Count, Absolute: 51.5 10*3/uL (ref 19.0–186.0)
Retic Ct Pct: 1.9 % (ref 0.4–3.1)

## 2015-04-08 LAB — CBC WITH DIFFERENTIAL/PLATELET
Basophils Absolute: 0 10*3/uL (ref 0.0–0.1)
Basophils Relative: 1 %
Eosinophils Absolute: 0.3 10*3/uL (ref 0.0–0.7)
Eosinophils Relative: 4 %
HCT: 23.5 % — ABNORMAL LOW (ref 36.0–46.0)
Hemoglobin: 7.3 g/dL — ABNORMAL LOW (ref 12.0–15.0)
Lymphocytes Relative: 15 %
Lymphs Abs: 1 10*3/uL (ref 0.7–4.0)
MCH: 26.9 pg (ref 26.0–34.0)
MCHC: 31.1 g/dL (ref 30.0–36.0)
MCV: 86.7 fL (ref 78.0–100.0)
Monocytes Absolute: 0.4 10*3/uL (ref 0.1–1.0)
Monocytes Relative: 5 %
Neutro Abs: 5.1 10*3/uL (ref 1.7–7.7)
Neutrophils Relative %: 75 %
Platelets: 350 10*3/uL (ref 150–400)
RBC: 2.71 MIL/uL — ABNORMAL LOW (ref 3.87–5.11)
RDW: 16.9 % — ABNORMAL HIGH (ref 11.5–15.5)
WBC: 6.9 10*3/uL (ref 4.0–10.5)

## 2015-04-08 LAB — BASIC METABOLIC PANEL
Anion gap: 10 (ref 5–15)
BUN: 73 mg/dL — ABNORMAL HIGH (ref 6–20)
CO2: 20 mmol/L — ABNORMAL LOW (ref 22–32)
Calcium: 8.6 mg/dL — ABNORMAL LOW (ref 8.9–10.3)
Chloride: 108 mmol/L (ref 101–111)
Creatinine, Ser: 6.48 mg/dL — ABNORMAL HIGH (ref 0.44–1.00)
GFR calc Af Amer: 7 mL/min — ABNORMAL LOW (ref >60)
GFR calc non Af Amer: 6 mL/min — ABNORMAL LOW (ref >60)
Glucose, Bld: 111 mg/dL — ABNORMAL HIGH (ref 65–99)
Potassium: 3.7 mmol/L (ref 3.5–5.1)
Sodium: 138 mmol/L (ref 135–145)

## 2015-04-08 LAB — URINE CULTURE

## 2015-04-08 LAB — FOLATE: Folate: 9.2 ng/mL (ref >5.9)

## 2015-04-08 LAB — VITAMIN B12
Vitamin B-12: 458 pg/mL (ref 180–914)
Vitamin B-12: 396 pg/mL (ref 180–914)

## 2015-04-08 LAB — FERRITIN: Ferritin: 166 ng/mL (ref 11–307)

## 2015-04-08 MED ORDER — PROCHLORPERAZINE EDISYLATE 5 MG/ML IJ SOLN
10.0000 mg | Freq: Four times a day (QID) | INTRAMUSCULAR | Status: DC | PRN
  Administered 2015-04-08 – 2015-04-09 (×2): 10 mg via INTRAVENOUS
  Filled 2015-04-08 (×4): qty 2

## 2015-04-08 MED ORDER — TECHNETIUM TC 99M MERTIATIDE
15.0000 | Freq: Once | INTRAVENOUS | Status: AC | PRN
  Administered 2015-04-08: 13.9 via INTRAVENOUS

## 2015-04-08 MED ORDER — FUROSEMIDE 10 MG/ML IJ SOLN
40.0000 mg | Freq: Once | INTRAMUSCULAR | Status: AC
  Administered 2015-04-08: 40 mg via INTRAVENOUS

## 2015-04-08 MED ORDER — FUROSEMIDE 10 MG/ML IJ SOLN
INTRAMUSCULAR | Status: AC
  Filled 2015-04-08: qty 4

## 2015-04-08 NOTE — Progress Notes (Addendum)
Subjective: Cc: Dr. Erling Cruz Cc: Dr. Lajean Manes   Stage 4 CKD. Baseline Cr 3.4-3.7. L renal atrophy. Chronic right hydronephrosis. Right JJ stent placed mid September.  Additional medical problems: hx nephrolithiasis                                                 A. Fib                                                 Chronic CHF                                                 anemia  Objective: Vital signs in last 24 hours: Temp:  [97.5 F (36.4 C)-98.5 F (36.9 C)] 98.5 F (36.9 C) (10/19 [id number]) Pulse Rate:  [65-86] 86 (10/19 0608) Resp:  [17-18] 17 (10/19 [id number]) BP: (101-122)/(50-58) 117/57 mmHg (10/19 0608) SpO2:  [95 %-100 %] 95 % (10/19 0608)A  Intake/Output from previous day: 10/18 0701 - 10/19 0700 In: [id number] [P.O.:600; I.V.:905; IV Piggyback:50] Out: -  Intake/Output this shift: Total I/O In: 955 [I.V.:905; IV Piggyback:50] Out: -   Past Medical History  Diagnosis Date  . Hypertension   . GERD (gastroesophageal reflux disease)   . Glaucoma   . H/O renal calculi 2002 & 2006  . H/O hiatal hernia   . Headache(784.0)     migraine  . Pneumonia     dx 10-06-2014 per CXR--  on 10-27-2014 pt states finished antibiotic and denies cough or fever  . Hyperlipidemia   . History of MI (myocardial infarction)     10/ 2013 in setting of Septic Shock  . History of atrial fibrillation     10/ 2013  in setting of Septic Shock  . History of CHF (congestive heart failure)     10/ 2013 in setting of septic shock  . Sigmoid diverticulosis   . Nephrolithiasis     bilateral  . CKD (chronic kidney disease), stage III     nephrologist-  dr Florene Glen  . Right ureteral stone   . History of acute respiratory failure     10/ 2013  -- ventilated in setting septic shock  . Dysrhythmia     Afib in 2013 when she had septic shock related to stone ureteral obstruction  . Myocardial infarction [facility])     Was reported in 2013 during hospitalization with septic shock  . Arthritis    knee's  . Foley catheter in place   . Septic shock ([place]) 04/04/2012  . Sepsis [facility])     Physical Exam:  Lungs - Normal respiratory effort, chest expands symmetrically.  Abdomen - Soft, non-tender & non-distended.  Lab Results:  Recent Labs  04/06/15 2108 04/07/15 0200  WBC 11.2* 11.2*  HGB 9.1* 7.6*  HCT 29.5* 24.8*   BMET  Recent Labs  04/06/15 2108 04/07/15 0200  NA 135 136  K 4.3 4.0  CL 106 109  CO2 20* 17*  GLUCOSE 119* 100*  BUN 86* 85*  CREATININE 8.02* 7.35*  CALCIUM 9.6 9.1  No results for input(s): LABURIN in the last 72 hours. Results for orders placed or performed during the hospital encounter of 02/25/15  Urine culture     Status: None   Collection Time: 02/25/15  8:13 PM  Result Value Ref Range Status   Specimen Description URINE, CATHETERIZED  Final   Special Requests NONE  Final   Culture   Final    5,000 COLONIES/mL INSIGNIFICANT GROWTH Performed at [facility]    Report Status 02/27/2015 FINAL  Final  Culture, blood (x 2)     Status: None   Collection Time: 02/26/15 12:05 AM  Result Value Ref Range Status   Specimen Description BLOOD RIGHT [facility]  Final   Special Requests BOTTLES DRAWN AEROBIC ONLY 6ML  Final   Culture   Final    NO GROWTH 5 DAYS Performed at [facility]    Report Status 03/03/2015 FINAL  Final  Culture, blood (x 2)     Status: None   Collection Time: 02/26/15 12:15 AM  Result Value Ref Range Status   Specimen Description BLOOD RIGHT HAND  Final   Special Requests BOTTLES DRAWN AEROBIC ONLY 5CC  Final   Culture   Final    NO GROWTH 5 DAYS Performed at [facility]    Report Status 03/03/2015 FINAL  Final  C difficile quick scan w PCR reflex     Status: Abnormal   Collection Time: 02/28/15 12:30 PM  Result Value Ref Range Status   C Diff antigen POSITIVE (A) NEGATIVE Final   C Diff toxin NEGATIVE NEGATIVE Final   C Diff interpretation   Final    C. difficile present, but toxin not detected.  This indicates colonization. In most cases, this does not require treatment. If patient has signs and symptoms consistent with colitis, consider treatment.    Studies/Results: No results found.  Assessment: Acute on chronic renal failure. CT shows JJ in good position on the right side, with atrophic Left kidney.I have discusse case with Dr. Florene Glen, and not sure that Urology has much to offer. Will review with Interventional radiology today to get 2nd opinion regarding chances of perc nephrostomy on right side resolving her AKI.   Plan: review Ct with IR today.   Christol Thetford I Derreck Wiltsey 04/08/2015, 6:20 AM    8: 40  Case discussed with IR: Recommendation for Renal function study. Doubtful if Right perc will help. ST

## 2015-04-08 NOTE — Consult Note (Signed)
Renal Service Consult Note [facility]  OANH SINEGAL 04/08/2015 [name] Requesting Physician:  Dr Grandville Silos    Reason for Consult: Acute on CRF HPI: The patient is a 69 y.o. year-old with hx of CKD baseline creat 3.5 f/b Dr Florene Glen. Hx of kidney stones, R ureteral stents, nonfunctioning L kidney, lithotripsy x 2 per patient.  Was admitted due to OP labs showing creat 8.  It was repeated over a several day period and didn't improve.  She denies recent vomiting / diarrhea, does have poor appetite. No nsaid's.  OTC meds only tylenol.   She gives hx poor appetite since May when "all this started".  No vomiting, occ nausea at smell or sight of food.  Some am nausea.  Has lost 39 lbs over 5 months.  Has hx of recurrent UTI's.  Looking back this year has had 4 UA's all with tntc wbc's. She notes chronic problems with dysuria, more or less , most of this year.  Urology notes say stage 4CKD. L renal atrophy.  Chronic R hydro, R JJ stent placed mid September.   Admit in May with peak creat 4.8 > dc creat 2.80.   Here in June with peak creat 4.68 and dc creat 3.41.   Here now creat 9.02 on admit, 7.35 yest and 6.48 today.    ROS  no cp, cough, no SOB  UOP normal  no diuretics  no color change to urine  no joint pains or myalgias  Past Medical History  Past Medical History  Diagnosis Date  . Hypertension   . GERD (gastroesophageal reflux disease)   . Glaucoma   . H/O renal calculi 2002 & 2006  . H/O hiatal hernia   . Headache(784.0)     migraine  . Pneumonia     dx 10-06-2014 per CXR--  on 10-27-2014 pt states finished antibiotic and denies cough or fever  . Hyperlipidemia   . History of MI (myocardial infarction)     10/ 2013 in setting of Septic Shock  . History of atrial fibrillation     10/ 2013  in setting of Septic Shock  . History of CHF (congestive heart failure)     10/ 2013 in setting of septic shock  . Sigmoid diverticulosis   .  Nephrolithiasis     bilateral  . CKD (chronic kidney disease), stage III     nephrologist-  dr Florene Glen  . Right ureteral stone   . History of acute respiratory failure     10/ 2013  -- ventilated in setting septic shock  . Dysrhythmia     Afib in 2013 when she had septic shock related to stone ureteral obstruction  . Myocardial infarction [facility])     Was reported in 2013 during hospitalization with septic shock  . Arthritis     knee's  . Foley catheter in place   . Septic shock ([place]) 04/04/2012  . Sepsis [facility])    Past Surgical History  Past Surgical History  Procedure Laterality Date  . Cystoscopy w/ ureteral stent placement  04/04/2012    Procedure: CYSTOSCOPY WITH RETROGRADE PYELOGRAM/URETERAL STENT PLACEMENT;  Surgeon: Ailene Rud, MD;  Location: [place];  Service: Urology;  Laterality: Left;  . Total abdominal hysterectomy w/ bilateral salpingoophorectomy  1993    secondary to fibroids  . Breast biopsy Left 08/23/07    benign fibrocystic with duct ectasia  . Cardiac catheterization  07-11-2012  dr Irish Lack    Abnormal stress test/  normal coronary arteries/  LVEDP  36mmHg  . Cardiovascular stress test  06-26-2012  dr Irish Lack    marked ischemia in the basal anterior, mid anterior, apical inferior regions/  normal LVF, ef 63%  . Transthoracic echocardiogram  04-09-2012    normal LVF,  ef 60-65%,  mild LAE,  mild TR, trivial MR and PR  . Cataract extraction w/ intraocular lens  implant, bilateral    . Knee arthroscopy Left 02-14-2003  . Laparoscopic cholecystectomy  03-23-2005  . Extracorporeal shock wave lithotripsy  05-28-2012  &  10-08-2012  . Cystoscopy with stent placement Right 10/28/2014    Procedure: RIGHT URETERAL STENT PLACEMENT;  Surgeon: Irine Seal, MD;  Location: [facility];  Service: Urology;  Laterality: Right;  . Cystoscopy/retrograde/ureteroscopy/stone extraction with basket Right 11/21/2014    Procedure: CYSTOSCOPY/RIGHT RETROGRADE  PYELOGRAM/RIGHT URETEROSCOPY/BASKET EXTRACTION/RIGHT PYELOSCOPY/LASER OF STONE/RIGHT DOUBLE J STENT;  Surgeon: Carolan Clines, MD;  Location: [place];  Service: Urology;  Laterality: Right;  . Holmium laser application Right [id number]    Procedure: HOLMIUM LASER APPLICATION;  Surgeon: Carolan Clines, MD;  Location: [facility];  Service: Urology;  Laterality: Right;  . Cystoscopy with stent placement Right 02/26/2015    Procedure: CYSTOSCOPY RETROGRADE PYELOGRAM WITH STENT PLACEMENT;  Surgeon: Cleon Gustin, MD;  Location: [facility];  Service: Urology;  Laterality: Right;   Family History  Family History  Problem Relation Age of Onset  . Hypertension Mother   . Cancer Mother 63    breast  . Dementia Mother   . Hypertension Brother   . Diabetes Brother   . Cancer Father 90    pancreatic  . Heart failure Paternal Grandmother   . Bladder Cancer Maternal Grandfather    Social History  reports that she has never smoked. She has never used smokeless tobacco. She reports that she does not drink alcohol or use illicit drugs. Allergies  Allergies  Allergen Reactions  . Vicodin [Hydrocodone-Acetaminophen] Nausea And Vomiting   Home medications Prior to Admission medications   Medication Sig Start Date End Date Taking? Authorizing Provider  acetaminophen (TYLENOL) 500 MG tablet Take 500 mg by mouth every 6 (six) hours as needed for moderate pain.   Yes Historical Provider, MD  bimatoprost (LUMIGAN) 0.01 % SOLN Place 1 drop into both eyes at bedtime.   Yes Historical Provider, MD  metoprolol tartrate (LOPRESSOR) 25 MG tablet Take 1 tablet (25 mg total) by mouth 2 (two) times daily. Patient taking differently: Take 25 mg by mouth daily.  11/21/14  Yes Jettie Booze, MD  ondansetron (ZOFRAN ODT) 4 MG disintegrating tablet Take 1 tablet (4 mg total) by mouth every 8 (eight) hours as needed for nausea or vomiting. 11/27/14  Yes Velvet Bathe, MD  ranitidine  (ZANTAC) 150 MG tablet Take 150 mg by mouth 2 (two) times daily as needed for heartburn.   Yes Historical Provider, MD  timolol (BETIMOL) 0.5 % ophthalmic solution Place 1 drop into both eyes every morning.    Yes Historical Provider, MD  amLODipine (NORVASC) 5 MG tablet Take 1 tablet (5 mg total) by mouth every evening. Patient not taking: Reported on 02/25/2015 11/21/14   Jettie Booze, MD  cephALEXin (KEFLEX) 500 MG capsule Take 1 capsule (500 mg total) by mouth 2 (two) times daily. Patient not taking: Reported on 04/06/2015 03/01/15   Oswald Hillock, MD   Liver Function Tests  Recent Labs Lab 04/06/15 2108  AST 17  ALT 15  ALKPHOS 76  BILITOT 0.5  PROT 7.2  ALBUMIN 3.1*   No results for input(s): LIPASE, AMYLASE in the last 168 hours. CBC  Recent Labs Lab 04/06/15 2108 04/07/15 0200 04/08/15 0915  WBC 11.2* 11.2* 6.9  NEUTROABS 8.7*  --  5.1  HGB 9.1* 7.6* 7.3*  HCT 29.5* 24.8* 23.5*  MCV 87.3 86.1 86.7  PLT 493* 418* [id number]   Basic Metabolic Panel  Recent Labs Lab 04/06/15 2108 04/07/15 0200 04/08/15 0545  NA 135 136 138  K 4.3 4.0 3.7  CL 106 109 108  CO2 20* 17* 20*  GLUCOSE 119* 100* 111*  BUN 86* 85* 73*  CREATININE 8.02* 7.35* 6.48*  CALCIUM 9.6 9.1 8.6*    Filed Vitals:   04/07/15 1500 04/07/15 2120 04/08/15 0608 04/08/15 1358  BP: 101/50 122/54 117/57 116/67  Pulse: 65 78 86 71  Temp: 98.4 F (36.9 C) 97.5 F (36.4 C) 98.5 F (36.9 C) 97.7 F (36.5 C)  TempSrc: Oral Oral Oral Oral  Resp: 18 18 17 18   Height:      Weight:      SpO2: 98% 100% 95% 96%   Exam Pale adult female, no distress, calm , looks tired No rash, cyanosis or gangrene Sclera anicteric, throat clear No jvd Chest clear bilat no rales Irreg rhythm, no MRG Abd obese soft ntnd no mass or ascites GU deferred LE's no edema, no joint effusion/ deformity No ulcers or foot wounds Neuro is nf , +asterixis  UA turbid, many bact, mod Hb, Large LE/ pos nitrite, 3-6 rbc, tntc  WBC Home meds > zantac, norvasc, Keflex, MTP, eyedrops   Assessment: 1 Acute on CKD 4 > looks vol depleted with poor po intake and soft BP's.  No acei/ ARB/ nsaids/ contrast. Recommend IVF's 100/ hr overnight.  Agree w primary that chronic GI issues (anorexia/ nausea) may be due to uremia.  Will need to consider dialysis most likely. Have d/w pt and family today.  2 CKD 4 baseline creat 3-4 3 Nephroureterolithiasis/ R hydro - s/p R JJ stent sept '16 4 HTN on norvasc/ mtp .  BP's soft here 5 AFib 6 Hx MI 7 Pyuria - ?recurrent UTI, cx's pending   Plan- increase IVF's, d/c MTP for now, watch HR, consider HD soon if not sig better.   Kelly Splinter MD [facility] pager [phone]    cell [phone] 04/08/2015, 3:57 PM

## 2015-04-08 NOTE — Progress Notes (Signed)
[facility] PROGRESS NOTE  CASIMERA GERADS [id number] DOB: 1945-12-16 DOA: 04/06/2015 PCP: [name], MD  Assessment/Plan: #1 Acute on CKD ?? Etiology. Baseline Cr 3.4-3.7. Patient had presented with a creatinine of 8.02 on 04/06/2015. On 03/01/2015 creatinine was 3.31. Patient with a history of left renal atrophic and stage IV chronic kidney disease D Vella new onset right hydronephrosis last month per CT without ureteral stones. Patient underwent cystoscopy and right ureteral stent at that time however her renal function had worsened. CT abdomen and pelvis done on admission with right ureteral stent in good position, left atrophic kidney with minimal dilatation and mild right-sided hydronephrosis with mild perinephric stranding bilaterally. No obstructing ureteral stone seen. Patient has been seen in consultation by urology and not showing any further urological procedures [place] offered for improvement in her renal function. Urology discuss case with interventional radiology who recommended a renal function study and on doubtful of a right percutaneous nephrostomy would improve her renal function. Creatinine is currently at 6.48. Will consult with nephrology for further evaluation and management.  #2 pyelonephritis/UTI Perinephric stranding noted on CT abdomen and pelvis. Patient has also presented with some back pain. Patient currently afebrile. WBC has normalized. Blood cultures pending. Urine cultures with multiple species present. Continue IV Rocephin empirically. Follow.  #3 anemia of chronic disease Hemoglobin currently is 7.3. No overt bleeding. Follow.  #4 hypertension BP stable. Continue Lopressor.  #5 prophylaxis Heparin for DVT prophylaxis.  Code Status: Full Family Communication: Updated patient and daughter at bedside. Disposition Plan: Home once renal function back to baseline with clinical improvement.   Consultants:  Urology: Dr. Junious Silk  04/07/2015  Procedures:  CT stone protocol 04/06/2015  Antibiotics:  IV Rocephin 04/06/2015  HPI/Subjective: Patient states is having a little better. Patient describes left back pain is more like a deep pain. Patient states some improvement since admission. Patient states she's making urine.  Objective: Filed Vitals:   04/08/15 0608  BP: 117/57  Pulse: 86  Temp: 98.5 F (36.9 C)  Resp: 17    Intake/Output Summary (Last 24 hours) at 04/08/15 1350 Last data filed at 04/08/15 0617  Gross per 24 hour  Intake   1195 ml  Output      0 ml  Net   1195 ml   Filed Weights   04/06/15 2020  Weight: 79.379 kg (175 lb)    Exam:   General:  NAD  Cardiovascular: RRR  Respiratory: CTAB  Abdomen: Soft, nontender, nondistended, positive bowel sounds.  Musculoskeletal: No clubbing cyanosis or edema.  Data Reviewed: Basic Metabolic Panel:  Recent Labs Lab 04/06/15 2108 04/07/15 0200 04/08/15 0545  NA 135 136 138  K 4.3 4.0 3.7  CL 106 109 108  CO2 20* 17* 20*  GLUCOSE 119* 100* 111*  BUN 86* 85* 73*  CREATININE 8.02* 7.35* 6.48*  CALCIUM 9.6 9.1 8.6*   Liver Function Tests:  Recent Labs Lab 04/06/15 2108  AST 17  ALT 15  ALKPHOS 76  BILITOT 0.5  PROT 7.2  ALBUMIN 3.1*   No results for input(s): LIPASE, AMYLASE in the last 168 hours. No results for input(s): AMMONIA in the last 168 hours. CBC:  Recent Labs Lab 04/06/15 2108 04/07/15 0200 04/08/15 0915  WBC 11.2* 11.2* 6.9  NEUTROABS 8.7*  --  5.1  HGB 9.1* 7.6* 7.3*  HCT 29.5* 24.8* 23.5*  MCV 87.3 86.1 86.7  PLT 493* 418* 350   Cardiac Enzymes: No results for input(s): CKTOTAL, CKMB, CKMBINDEX, TROPONINI in  the last 168 hours. BNP (last 3 results)  Recent Labs  02/26/15 0530  BNP 35.3    ProBNP (last 3 results) No results for input(s): PROBNP in the last 8760 hours.  CBG: No results for input(s): GLUCAP in the last 168 hours.  Recent Results (from the past 240 hour(s))  Urine  culture     Status: None   Collection Time: 04/06/15  9:46 PM  Result Value Ref Range Status   Specimen Description URINE, CLEAN CATCH  Final   Special Requests NONE  Final   Culture   Final    MULTIPLE SPECIES PRESENT, SUGGEST RECOLLECTION Performed at [facility]    Report Status 04/08/2015 FINAL  Final     Studies: Ct Renal Stone Study  04/06/2015  CLINICAL DATA:  Acute onset of renal failure. Nausea. Recent placement of right ureteral stent for obstructing ureteral stone. Initial encounter. EXAM: CT ABDOMEN AND PELVIS WITHOUT CONTRAST TECHNIQUE: Multidetector CT imaging of the abdomen and pelvis was performed following the standard protocol without IV contrast. COMPARISON:  CT of the abdomen and pelvis performed 02/20/2015, and renal ultrasound performed 02/27/2015 FINDINGS: The visualized lung bases are clear. A large hiatal hernia is noted. Mild calcification is noted at the mitral valve. The liver and spleen are unremarkable in appearance. The patient is status post cholecystectomy, with clips noted at the gallbladder fossa. The pancreas and adrenal glands are unremarkable. There is mild right-sided and minimal left-sided hydronephrosis, with diffuse wall thickening at the renal calyces and pelves, extending along both ureters, and mild perinephric stranding. Findings are concerning for pyelonephritis and ureteritis. A right ureteral stent is noted in expected position. No obstructing ureteral stones are seen. There is wall thickening about the bladder, with associated soft tissue inflammation, reflecting underlying cystitis. No free fluid is identified. The small bowel is unremarkable in appearance. The stomach is within normal limits. No acute vascular abnormalities are seen. The appendix is normal in caliber, without evidence of appendicitis. Mild scattered diverticulosis is noted along the transverse, descending and proximal sigmoid colon, without evidence of diverticulitis. There  is a relatively broad-based moderate anterior abdominal wall hernia extending into the pannus, containing only fat. A small periumbilical hernia is also noted, to the right of the umbilicus, containing only fat. The patient is status post hysterectomy. No suspicious adnexal masses are seen. No inguinal lymphadenopathy is seen. No acute osseous abnormalities are identified. There is grade 1 retrolisthesis of L2 on L3, with associated vacuum phenomenon and endplate sclerotic change, and minimal grade 1 retrolisthesis of L3 on L4. IMPRESSION: 1. Mild right-sided and minimal left-sided hydronephrosis, with diffuse wall thickening at the renal calyces and pelves, extending along both ureters, and mild perinephric stranding. Findings concerning for bilateral pyelonephritis and ureteritis, somewhat more prominent than in September. Right ureteral stent noted in expected position. No obstructing ureteral stone seen. 2. Diffuse bladder wall thickening, with associated soft tissue inflammation, concerning for underlying cystitis. 3. Large hiatal hernia noted. 4. Mild scattered diverticulosis along the transverse, descending and proximal sigmoid colon, without evidence of diverticulitis. 5. Relatively broad-based moderate anterior abdominal wall hernia extending into the pannus, containing only fat. Small periumbilical hernia to the right of the umbilicus, also containing only fat. 6. Mild degenerative change at the mid lumbar spine. Electronically Signed   By: Garald Balding M.D.   On: 04/06/2015 22:21    Scheduled Meds: . cefTRIAXone (ROCEPHIN)  IV  1 g Intravenous Q24H  . feeding supplement  1 Container  Oral TID BM  . heparin  5,000 Units Subcutaneous 3 times per day  . latanoprost  1 drop Both Eyes QHS  . metoprolol tartrate  25 mg Oral Daily  . saccharomyces boulardii  250 mg Oral BID  . timolol  1 drop Both Eyes q morning - 10a   Continuous Infusions: . dextrose 5 % 1,000 mL with sodium bicarbonate 150 mEq  infusion 50 mL/hr at 04/08/15 1010    Principal Problem:   Acute-on-chronic kidney injury [facility]) Active Problems:   Acute pyelonephritis   Bilateral hydronephrosis   CKD (chronic kidney disease) stage 4, GFR 15-29 ml/min (HCC)   Leukocytosis   Essential hypertension    Time spent: 97 mins    [facility] MD [facility] Pager [phone]. If 7PM-7AM, please contact night-coverage at [email or web address], password [facility] 04/08/2015, 1:50 PM  LOS: 2 days

## 2015-04-09 ENCOUNTER — Encounter (HOSPITAL_COMMUNITY): Payer: Self-pay | Admitting: Student

## 2015-04-09 DIAGNOSIS — N19 Unspecified kidney failure: Secondary | ICD-10-CM | POA: Diagnosis present

## 2015-04-09 LAB — FOLATE RBC
Folate, Hemolysate: 293.1 ng/mL
Folate, RBC: 1242 ng/mL (ref >498)
Hematocrit: 23.6 % — ABNORMAL LOW (ref 34.0–46.6)

## 2015-04-09 LAB — CBC
HCT: 26.2 % — ABNORMAL LOW (ref 36.0–46.0)
Hemoglobin: 8.1 g/dL — ABNORMAL LOW (ref 12.0–15.0)
MCH: 26.6 pg (ref 26.0–34.0)
MCHC: 30.9 g/dL (ref 30.0–36.0)
MCV: 85.9 fL (ref 78.0–100.0)
Platelets: 392 10*3/uL (ref 150–400)
RBC: 3.05 MIL/uL — ABNORMAL LOW (ref 3.87–5.11)
RDW: 16.7 % — ABNORMAL HIGH (ref 11.5–15.5)
WBC: 10.6 10*3/uL — ABNORMAL HIGH (ref 4.0–10.5)

## 2015-04-09 LAB — BASIC METABOLIC PANEL
Anion gap: 13 (ref 5–15)
BUN: 62 mg/dL — ABNORMAL HIGH (ref 6–20)
CO2: 29 mmol/L (ref 22–32)
Calcium: 8.7 mg/dL — ABNORMAL LOW (ref 8.9–10.3)
Chloride: 98 mmol/L — ABNORMAL LOW (ref 101–111)
Creatinine, Ser: 5.76 mg/dL — ABNORMAL HIGH (ref 0.44–1.00)
GFR calc Af Amer: 8 mL/min — ABNORMAL LOW (ref >60)
GFR calc non Af Amer: 7 mL/min — ABNORMAL LOW (ref >60)
Glucose, Bld: 108 mg/dL — ABNORMAL HIGH (ref 65–99)
Potassium: 3.2 mmol/L — ABNORMAL LOW (ref 3.5–5.1)
Sodium: 140 mmol/L (ref 135–145)

## 2015-04-09 LAB — VITAMIN B1: Vitamin B1 (Thiamine): 123.3 nmol/L (ref 66.5–200.0)

## 2015-04-09 MED ORDER — ONDANSETRON HCL 4 MG/2ML IJ SOLN: 4.0000 mg | Freq: Four times a day (QID) | INTRAMUSCULAR | Status: DC | PRN

## 2015-04-09 MED ORDER — PENTAFLUOROPROP-TETRAFLUOROETH EX AERO: 1.0000 "application " | INHALATION_SPRAY | CUTANEOUS | Status: DC | PRN

## 2015-04-09 MED ORDER — ZOLPIDEM TARTRATE 5 MG PO TABS: 5.0000 mg | ORAL_TABLET | Freq: Every evening | ORAL | Status: DC | PRN

## 2015-04-09 MED ORDER — HEPARIN SODIUM (PORCINE) 1000 UNIT/ML DIALYSIS: 1000.0000 [IU] | INTRAMUSCULAR | Status: DC | PRN

## 2015-04-09 MED ORDER — ALTEPLASE 2 MG IJ SOLR
2.0000 mg | Freq: Once | INTRAMUSCULAR | Status: DC | PRN
  Filled 2015-04-09: qty 2

## 2015-04-09 MED ORDER — ACETAMINOPHEN 325 MG PO TABS
650.0000 mg | ORAL_TABLET | Freq: Four times a day (QID) | ORAL | Status: DC | PRN
  Administered 2015-04-13 – 2015-04-16 (×3): 650 mg via ORAL
  Filled 2015-04-09 (×2): qty 2

## 2015-04-09 MED ORDER — LIDOCAINE-PRILOCAINE 2.5-2.5 % EX CREA
1.0000 "application " | TOPICAL_CREAM | CUTANEOUS | Status: DC | PRN
  Filled 2015-04-09: qty 5

## 2015-04-09 MED ORDER — HYDROXYZINE HCL 25 MG PO TABS: 25.0000 mg | ORAL_TABLET | Freq: Three times a day (TID) | ORAL | Status: DC | PRN

## 2015-04-09 MED ORDER — CAMPHOR-MENTHOL 0.5-0.5 % EX LOTN
1.0000 "application " | TOPICAL_LOTION | Freq: Three times a day (TID) | CUTANEOUS | Status: DC | PRN
  Filled 2015-04-09: qty 222

## 2015-04-09 MED ORDER — SODIUM CHLORIDE 0.9 % IV SOLN: 100.0000 mL | INTRAVENOUS | Status: DC | PRN

## 2015-04-09 MED ORDER — LIDOCAINE HCL (PF) 1 % IJ SOLN: 5.0000 mL | INTRAMUSCULAR | Status: DC | PRN

## 2015-04-09 MED ORDER — DOCUSATE SODIUM 283 MG RE ENEM
1.0000 | ENEMA | RECTAL | Status: DC | PRN
  Filled 2015-04-09: qty 1

## 2015-04-09 MED ORDER — POTASSIUM CHLORIDE CRYS ER 20 MEQ PO TBCR
40.0000 meq | EXTENDED_RELEASE_TABLET | Freq: Once | ORAL | Status: AC
  Administered 2015-04-09: 40 meq via ORAL
  Filled 2015-04-09: qty 2

## 2015-04-09 MED ORDER — CALCIUM CARBONATE 1250 MG/5ML PO SUSP
500.0000 mg | Freq: Four times a day (QID) | ORAL | Status: DC | PRN
  Administered 2015-04-12: 500 mg via ORAL
  Filled 2015-04-09 (×2): qty 5

## 2015-04-09 MED ORDER — HEPARIN SODIUM (PORCINE) 1000 UNIT/ML DIALYSIS: 1500.0000 [IU] | INTRAMUSCULAR | Status: DC | PRN

## 2015-04-09 MED ORDER — SORBITOL 70 % SOLN
30.0000 mL | Status: DC | PRN
  Filled 2015-04-09: qty 30

## 2015-04-09 MED ORDER — ACETAMINOPHEN 650 MG RE SUPP: 650.0000 mg | Freq: Four times a day (QID) | RECTAL | Status: DC | PRN

## 2015-04-09 MED ORDER — NEPRO/CARBSTEADY PO LIQD
237.0000 mL | Freq: Three times a day (TID) | ORAL | Status: DC | PRN
  Filled 2015-04-09: qty 237

## 2015-04-09 MED ORDER — ONDANSETRON HCL 4 MG PO TABS: 4.0000 mg | ORAL_TABLET | Freq: Four times a day (QID) | ORAL | Status: DC | PRN

## 2015-04-09 NOTE — Progress Notes (Signed)
Patient has been seen in follow-up by nephrology who feel that patient has been uremic for the past 2 months and will require initiation of dialysis. Will transfer patient to [facility] for dialysis to be initiated tomorrow morning per nephrology requests.

## 2015-04-09 NOTE — Progress Notes (Signed)
Subjective: Cr today 5.76 following nephrology and IM correction of hydration status.    Objective: Vital signs in last 24 hours: Temp:  [97.7 F (36.5 C)-98.4 F (36.9 C)] 98.4 F (36.9 C) (10/20 0523) Pulse Rate:  [71-92] 92 (10/20 0523) Resp:  [18] 18 (10/20 0523) BP: (116-131)/(61-86) 118/86 mmHg (10/20 0523) SpO2:  [93 %-96 %] 93 % (10/20 0523)A  Intake/Output from previous day: 10/19 0701 - 10/20 0700 In: 1712.5 [I.V.:1662.5; IV Piggyback:50] Out: -  Intake/Output this shift:    Past Medical History  Diagnosis Date  . Hypertension   . GERD (gastroesophageal reflux disease)   . Glaucoma   . H/O renal calculi 2002 & 2006  . H/O hiatal hernia   . Headache(784.0)     migraine  . Pneumonia     dx 10-06-2014 per CXR--  on 10-27-2014 pt states finished antibiotic and denies cough or fever  . Hyperlipidemia   . History of MI (myocardial infarction)     10/ 2013 in setting of Septic Shock  . History of atrial fibrillation     10/ 2013  in setting of Septic Shock  . History of CHF (congestive heart failure)     10/ 2013 in setting of septic shock  . Sigmoid diverticulosis   . Nephrolithiasis     bilateral  . CKD (chronic kidney disease), stage III     nephrologist-  dr Florene Glen  . Right ureteral stone   . History of acute respiratory failure     10/ 2013  -- ventilated in setting septic shock  . Dysrhythmia     Afib in 2013 when she had septic shock related to stone ureteral obstruction  . Myocardial infarction [facility])     Was reported in 2013 during hospitalization with septic shock  . Arthritis     knee's  . Foley catheter in place   . Septic shock ([place]) 04/04/2012  . Sepsis [facility])     Physical Exam:  Lungs - Normal respiratory effort, chest expands symmetrically.  Abdomen - Soft, non-tender & non-distended.  Lab Results:  Recent Labs  04/07/15 0200 04/08/15 0915 04/09/15 0525  WBC 11.2* 6.9 10.6*  HGB 7.6* 7.3* 8.1*  HCT 24.8* 23.5* 26.2*    BMET  Recent Labs  04/08/15 0545 04/09/15 0525  NA 138 140  K 3.7 3.2*  CL 108 98*  CO2 20* 29  GLUCOSE 111* 108*  BUN 73* 62*  CREATININE 6.48* 5.76*  CALCIUM 8.6* 8.7*   No results for input(s): LABURIN in the last 72 hours. Results for orders placed or performed during the hospital encounter of 04/06/15  Urine culture     Status: None   Collection Time: 04/06/15  9:46 PM  Result Value Ref Range Status   Specimen Description URINE, CLEAN CATCH  Final   Special Requests NONE  Final   Culture   Final    MULTIPLE SPECIES PRESENT, SUGGEST RECOLLECTION Performed at [facility]    Report Status 04/08/2015 FINAL  Final  Culture, blood (routine x 2)     Status: None (Preliminary result)   Collection Time: 04/07/15  1:55 AM  Result Value Ref Range Status   Specimen Description BLOOD LEFT ARM  Final   Special Requests BOTTLES DRAWN AEROBIC AND ANAEROBIC 10CC  Final   Culture   Final    NO GROWTH 1 DAY Performed at [facility]    Report Status PENDING  Incomplete  Culture, blood (routine x 2)  Status: None (Preliminary result)   Collection Time: 04/07/15  2:00 AM  Result Value Ref Range Status   Specimen Description BLOOD LEFT HAND  Final   Special Requests BOTTLES DRAWN AEROBIC AND ANAEROBIC 10CC  Final   Culture   Final    NO GROWTH 1 DAY Performed at [facility]    Report Status PENDING  Incomplete    Studies/Results: Nm Renal Imaging Flow W/pharm  04/08/2015  CLINICAL DATA:  Acute kidney disease. History of left renal atrophy and chronic right hydronephrosis. EXAM: NUCLEAR MEDICINE RENAL SCAN WITH DIURETIC ADMINISTRATION TECHNIQUE: Radionuclide angiographic and sequential renal images were obtained after intravenous injection of radiopharmaceutical. Imaging was continued during slow intravenous injection of Lasix approximately 15 minutes after the start of the examination. RADIOPHARMACEUTICALS:  13.9 mCi Technetium-36m MAG3 IV COMPARISON:   None. FINDINGS: Flow: Decreased perfusion to the smaller left kidney. Normal perfusion to the right kidney. Left renogram: Diminished cortical uptake by the small left kidney. There is delayed excretion of the radiopharmaceutical. Right renogram: Normal cortical uptake. There is diminished clearance of the radiopharmaceutical from the dilated right renal collecting system. Differential: Left kidney = 28 % Right kidney = 72 % T1/2 post Lasix : Left kidney = not achieved min Right kidney = not achieved. Min IMPRESSION: 1. Diminished left renal function. There is delayed perfusion, cortical uptake and excretion of the radiopharmaceutical by the small left kidney. 2. Delayed and diminished clearance of the radiopharmaceutical from the dilated right renal collecting system compatible with partial obstruction. 3. Split renal function is equal to 72% from the right kidney and 28% from the left kidney. Electronically Signed   By: Kerby Moors M.D.   On: 04/08/2015 16:34    Assessment: Discussed with  Interventional radiology: Pt does drain from her right kidney, although not completely. Not enough hydro to warrant changing stent or having a perc. Note decreased Cr. today. Pt has proven to be VERY HIGH anesthetic risk in the past.   Plan: follow.   Ailene Rud MD 04/09/2015, 8:30 AM

## 2015-04-09 NOTE — Progress Notes (Signed)
[facility] PROGRESS NOTE  [name] [id number] DOB: 08-21-1945 DOA: 04/06/2015 PCP: [name], MD  Assessment/Plan: #1 Acute on CKD ?? Etiology. Baseline Cr 3.4-3.7. Patient had presented with a creatinine of 8.02 on 04/06/2015. On 03/01/2015 creatinine was 3.31. Patient with a history of left renal atrophic and stage IV chronic kidney disease D [place] new onset right hydronephrosis last month per CT without ureteral stones. Patient underwent cystoscopy and right ureteral stent at that time however her renal function had worsened. CT abdomen and pelvis done on admission with right ureteral stent in good position, left atrophic kidney with minimal dilatation and mild right-sided hydronephrosis with mild perinephric stranding bilaterally. No obstructing ureteral stone seen. Patient has been seen in consultation by urology and not showing any further urological procedures [place] offered for improvement in her renal function. Urology discussed case with interventional radiology who recommended a renal function study and doubtful if a right percutaneous nephrostomy would improve her renal function. Creatinine is currently at 5.76 . Patient likely uremic. Patient has been seen by nephrology, and patient started on IVF, and may possibly need HD. Renal ff.    #2 pyelonephritis/UTI Perinephric stranding noted on CT abdomen and pelvis. Patient has also presented with some back pain. Patient currently afebrile. WBC has normalized. Blood cultures pending. Urine cultures with multiple species present. Continue IV Rocephin empirically. Follow.  #3 Probable Uremia Patient with GI symptoms, likely secondary to uremia. Renal ff.  #4 anemia of chronic disease Hemoglobin currently is 7.3. No overt bleeding. Follow.  #5 hypertension BP stable. Continue Lopressor.  #6 prophylaxis Heparin for DVT prophylaxis.  Code Status: Full Family Communication: Updated patient and daughter at  bedside. Disposition Plan: Home once renal function back to baseline with clinical improvement.   Consultants:  Urology: Dr. Junious Silk 04/07/2015  Procedures:  CT stone protocol 04/06/2015  Antibiotics:  IV Rocephin 04/06/2015  HPI/Subjective: Patient c/o nausea, and migraines. Patient denies SOB. No CP.  Objective: Filed Vitals:   04/09/15 0523  BP: 118/86  Pulse: 92  Temp: 98.4 F (36.9 C)  Resp: 18    Intake/Output Summary (Last 24 hours) at 04/09/15 1226 Last data filed at 04/09/15 0400  Gross per 24 hour  Intake 1712.5 ml  Output      0 ml  Net 1712.5 ml   Filed Weights   04/06/15 2020  Weight: 79.379 kg (175 lb)    Exam:   General:  NAD  Cardiovascular: RRR  Respiratory: CTAB  Abdomen: Soft, nontender, nondistended, positive bowel sounds.  Musculoskeletal: No clubbing cyanosis or edema.  Data Reviewed: Basic Metabolic Panel:  Recent Labs Lab 04/06/15 2108 04/07/15 0200 04/08/15 0545 04/09/15 0525  NA 135 136 138 140  K 4.3 4.0 3.7 3.2*  CL 106 109 108 98*  CO2 20* 17* 20* 29  GLUCOSE 119* 100* 111* 108*  BUN 86* 85* 73* 62*  CREATININE 8.02* 7.35* 6.48* 5.76*  CALCIUM 9.6 9.1 8.6* 8.7*   Liver Function Tests:  Recent Labs Lab 04/06/15 2108  AST 17  ALT 15  ALKPHOS 76  BILITOT 0.5  PROT 7.2  ALBUMIN 3.1*   No results for input(s): LIPASE, AMYLASE in the last 168 hours. No results for input(s): AMMONIA in the last 168 hours. CBC:  Recent Labs Lab 04/06/15 2108 04/07/15 0200 04/08/15 0915 04/09/15 0525  WBC 11.2* 11.2* 6.9 10.6*  NEUTROABS 8.7*  --  5.1  --   HGB 9.1* 7.6* 7.3* 8.1*  HCT 29.5* 24.8* 23.5* 26.2*  MCV 87.3 86.1 86.7 85.9  PLT 493* 418* 350 392   Cardiac Enzymes: No results for input(s): CKTOTAL, CKMB, CKMBINDEX, TROPONINI in the last 168 hours. BNP (last 3 results)  Recent Labs  02/26/15 0530  BNP 35.3    ProBNP (last 3 results) No results for input(s): PROBNP in the last 8760  hours.  CBG: No results for input(s): GLUCAP in the last 168 hours.  Recent Results (from the past 240 hour(s))  Urine culture     Status: None   Collection Time: 04/06/15  9:46 PM  Result Value Ref Range Status   Specimen Description URINE, CLEAN CATCH  Final   Special Requests NONE  Final   Culture   Final    MULTIPLE SPECIES PRESENT, SUGGEST RECOLLECTION Performed at [facility]    Report Status 04/08/2015 FINAL  Final  Culture, blood (routine x 2)     Status: None (Preliminary result)   Collection Time: 04/07/15  1:55 AM  Result Value Ref Range Status   Specimen Description BLOOD LEFT ARM  Final   Special Requests BOTTLES DRAWN AEROBIC AND ANAEROBIC 10CC  Final   Culture   Final    NO GROWTH 1 DAY Performed at [facility]    Report Status PENDING  Incomplete  Culture, blood (routine x 2)     Status: None (Preliminary result)   Collection Time: 04/07/15  2:00 AM  Result Value Ref Range Status   Specimen Description BLOOD LEFT HAND  Final   Special Requests BOTTLES DRAWN AEROBIC AND ANAEROBIC 10CC  Final   Culture   Final    NO GROWTH 1 DAY Performed at [facility]    Report Status PENDING  Incomplete     Studies: Nm Renal Imaging Flow W/pharm  04/08/2015  CLINICAL DATA:  Acute kidney disease. History of left renal atrophy and chronic right hydronephrosis. EXAM: NUCLEAR MEDICINE RENAL SCAN WITH DIURETIC ADMINISTRATION TECHNIQUE: Radionuclide angiographic and sequential renal images were obtained after intravenous injection of radiopharmaceutical. Imaging was continued during slow intravenous injection of Lasix approximately 15 minutes after the start of the examination. RADIOPHARMACEUTICALS:  13.9 mCi Technetium-21m MAG3 IV COMPARISON:  None. FINDINGS: Flow: Decreased perfusion to the smaller left kidney. Normal perfusion to the right kidney. Left renogram: Diminished cortical uptake by the small left kidney. There is delayed excretion of the  radiopharmaceutical. Right renogram: Normal cortical uptake. There is diminished clearance of the radiopharmaceutical from the dilated right renal collecting system. Differential: Left kidney = 28 % Right kidney = 72 % T1/2 post Lasix : Left kidney = not achieved min Right kidney = not achieved. Min IMPRESSION: 1. Diminished left renal function. There is delayed perfusion, cortical uptake and excretion of the radiopharmaceutical by the small left kidney. 2. Delayed and diminished clearance of the radiopharmaceutical from the dilated right renal collecting system compatible with partial obstruction. 3. Split renal function is equal to 72% from the right kidney and 28% from the left kidney. Electronically Signed   By: Kerby Moors M.D.   On: 04/08/2015 16:34    Scheduled Meds: . cefTRIAXone (ROCEPHIN)  IV  1 g Intravenous Q24H  . feeding supplement  1 Container Oral TID BM  . heparin  5,000 Units Subcutaneous 3 times per day  . latanoprost  1 drop Both Eyes QHS  . saccharomyces boulardii  250 mg Oral BID  . timolol  1 drop Both Eyes q morning - 10a   Continuous Infusions: . dextrose 5 %  1,000 mL with sodium bicarbonate 150 mEq infusion 100 mL/hr at 04/09/15 1214    Principal Problem:   Acute-on-chronic kidney injury [facility]) Active Problems:   Acute pyelonephritis   Bilateral hydronephrosis   CKD (chronic kidney disease) stage 4, GFR 15-29 ml/min (HCC)   Leukocytosis   Essential hypertension   Uremia    Time spent: 35 mins    Gi Or Norman MD [facility] Pager [phone]. If 7PM-7AM, please contact night-coverage at [email or web address], password [facility] 04/09/2015, 12:26 PM  LOS: 3 days

## 2015-04-09 NOTE — Progress Notes (Signed)
Transfer Note:   Arrival Method: Patient arrived from [facility] hospital via [facility] transport Mental Orientation: Alert and oriented x4 Telemetry: N/A Assessment: Completed Skin: See docflowsheet IV: LFA-D5W + 150 meq Sodium Bicarbonate @ 50 Pain: Denies Tubes: N/A Safety Measures: Safety Fall Prevention Plan has been given, discussed  Admission: Completed [address] [place] Orientation: Patient has been orientated to the room, unit and staff.  Family:  Orders have been reviewed and implemented. Will continue to monitor the patient. Call light has been placed within reach and bed alarm has been activated.   [employer], RN-BC Phone number: [phone]

## 2015-04-09 NOTE — Care Management Important Message (Signed)
Important Message  Patient Details  Name: [name] MRN: [id number] Date of Birth: 05-26-1946   Medicare Important Message Given:  Yes-second notification given    Camillo Flaming 04/09/2015, 11:48 AMImportant Message  Patient Details  Name: [name] MRN: [id number] Date of Birth: 11/13/1945   Medicare Important Message Given:  Yes-second notification given    Camillo Flaming 04/09/2015, 11:47 AM

## 2015-04-09 NOTE — Progress Notes (Signed)
Patient transferred to [facility] [phone] via [place].

## 2015-04-09 NOTE — Progress Notes (Signed)
  [facility] Progress Note   Subjective: starting to feel better.  More appetite, eat crackers and some other snack food  Filed Vitals:   04/08/15 1358 04/08/15 2008 04/09/15 0523 04/09/15 1400  BP: 116/67 131/61 118/86 116/50  Pulse: 71 81 92 80  Temp: 97.7 F (36.5 C) 97.8 F (36.6 C) 98.4 F (36.9 C) 98.2 F (36.8 C)  TempSrc: Oral Oral Oral Oral  Resp: 18 18 18    Height:      Weight:      SpO2: 96% 96% 93% 93%   Exam: Adult female, no distress, fatigued and pale No jvd Chest clear bilat no rales Irreg rhythm, no MRG Abd obese soft ntnd no mass or ascites GU deferred LE's no edema, no joint effusion/ deformity No ulcers or foot wounds Neuro is nf , no asterixis today  UA turbid, many bact, mod Hb, Large LE/ pos nitrite, 3-6 rbc, tntc WBC Home meds > zantac, norvasc, Keflex, MTP, eyedrops   Assessment: 1 CKD stage 4/5 - pt is uremic x 2 mos and will require initiation of dialysis. She has watched the HD films and is agreeing to proceed. Plan tx to [facility], have asked IR to see for tunneled HD cath.  Will contact [facility] surg in am regarding AVF placement prior to dc.   3 Nephroureterolithiasis/ R hydro - s/p R JJ stent sept '16 4 HTN on norvasc/ mtp at home, no BP meds here. BP's low normal 5 AFib 6 Hx MI 7 Pyuria - recurrent problem, on Rocephin, multiple colonies on culture 8 Anemia - checkFe  Plan - as above, plan [facility] tomorrow and HD thereafter   Kelly Splinter MD [facility] pager [phone]    cell [phone] 04/09/2015, 4:16 PM    Recent Labs Lab 04/07/15 0200 04/08/15 0545 04/09/15 0525  NA 136 138 140  K 4.0 3.7 3.2*  CL 109 108 98*  CO2 17* 20* 29  GLUCOSE 100* 111* 108*  BUN 85* 73* 62*  CREATININE 7.35* 6.48* 5.76*  CALCIUM 9.1 8.6* 8.7*    Recent Labs Lab 04/06/15 2108  AST 17  ALT 15  ALKPHOS 76  BILITOT 0.5  PROT 7.2  ALBUMIN 3.1*    Recent Labs Lab 04/06/15 2108 04/07/15 0200 04/08/15 0545  04/08/15 0915 04/09/15 0525  WBC 11.2* 11.2*  --  6.9 10.6*  NEUTROABS 8.7*  --   --  5.1  --   HGB 9.1* 7.6*  --  7.3* 8.1*  HCT 29.5* 24.8* 23.6* 23.5* 26.2*  MCV 87.3 86.1  --  86.7 85.9  PLT 493* 418*  --  350 392   . cefTRIAXone (ROCEPHIN)  IV  1 g Intravenous Q24H  . feeding supplement  1 Container Oral TID BM  . heparin  5,000 Units Subcutaneous 3 times per day  . latanoprost  1 drop Both Eyes QHS  . saccharomyces boulardii  250 mg Oral BID  . timolol  1 drop Both Eyes q morning - 10a   . dextrose 5 % 1,000 mL with sodium bicarbonate 150 mEq infusion 100 mL/hr at 04/09/15 1214   acetaminophen **OR** acetaminophen, ondansetron **OR** ondansetron (ZOFRAN) IV, oxyCODONE, prochlorperazine

## 2015-04-10 ENCOUNTER — Inpatient Hospital Stay (HOSPITAL_COMMUNITY): Payer: Medicare Other

## 2015-04-10 LAB — BASIC METABOLIC PANEL
Anion gap: 13 (ref 5–15)
BUN: 49 mg/dL — ABNORMAL HIGH (ref 6–20)
CO2: 35 mmol/L — ABNORMAL HIGH (ref 22–32)
Calcium: 8.5 mg/dL — ABNORMAL LOW (ref 8.9–10.3)
Chloride: 90 mmol/L — ABNORMAL LOW (ref 101–111)
Creatinine, Ser: 5.79 mg/dL — ABNORMAL HIGH (ref 0.44–1.00)
GFR calc Af Amer: 8 mL/min — ABNORMAL LOW (ref >60)
GFR calc non Af Amer: 7 mL/min — ABNORMAL LOW (ref >60)
Glucose, Bld: 106 mg/dL — ABNORMAL HIGH (ref 65–99)
Potassium: 3.2 mmol/L — ABNORMAL LOW (ref 3.5–5.1)
Sodium: 138 mmol/L (ref 135–145)

## 2015-04-10 MED ORDER — FENTANYL CITRATE (PF) 100 MCG/2ML IJ SOLN
INTRAMUSCULAR | Status: AC | PRN
  Administered 2015-04-10: 50 ug via INTRAVENOUS

## 2015-04-10 MED ORDER — GELATIN ABSORBABLE 12-7 MM EX MISC
CUTANEOUS | Status: AC
  Filled 2015-04-10: qty 1

## 2015-04-10 MED ORDER — CEFAZOLIN SODIUM-DEXTROSE 2-3 GM-% IV SOLR
INTRAVENOUS | Status: AC
  Filled 2015-04-10: qty 50

## 2015-04-10 MED ORDER — MIDAZOLAM HCL 2 MG/2ML IJ SOLN
INTRAMUSCULAR | Status: AC
  Filled 2015-04-10: qty 2

## 2015-04-10 MED ORDER — HEPARIN SODIUM (PORCINE) 5000 UNIT/ML IJ SOLN
5000.0000 [IU] | Freq: Three times a day (TID) | INTRAMUSCULAR | Status: DC
  Administered 2015-04-10 – 2015-04-16 (×16): 5000 [IU] via SUBCUTANEOUS
  Filled 2015-04-10 (×14): qty 1

## 2015-04-10 MED ORDER — MIDAZOLAM HCL 2 MG/2ML IJ SOLN
INTRAMUSCULAR | Status: AC | PRN
  Administered 2015-04-10: 1 mg via INTRAVENOUS

## 2015-04-10 MED ORDER — HEPARIN SODIUM (PORCINE) 1000 UNIT/ML IJ SOLN
INTRAMUSCULAR | Status: AC
  Filled 2015-04-10: qty 1

## 2015-04-10 MED ORDER — FENTANYL CITRATE (PF) 100 MCG/2ML IJ SOLN
INTRAMUSCULAR | Status: AC
  Filled 2015-04-10: qty 2

## 2015-04-10 NOTE — Procedures (Signed)
Successful RT IJ HD CATH TIP SVC/RA NO COMP STABLE READY FOR USE FULL REPORT IN PACS

## 2015-04-10 NOTE — Consult Note (Signed)
Chief Complaint: Patient was seen in consultation today for chronic kidney disease; uremia at the request of Dr Jonnie Finner  Referring Physician(s): Dr Jonnie Finner  History of Present Illness: [name] is a 69 y.o. female   Pt with CKD 4-5 Uremia x 2 months Nephrology feels need to initiate dialysis Request for tunneled dialysis catheter placement   Past Medical History  Diagnosis Date  . Hypertension   . GERD (gastroesophageal reflux disease)   . Glaucoma   . H/O renal calculi 2002 & 2006  . H/O hiatal hernia   . Headache(784.0)     migraine  . Pneumonia     dx 10-06-2014 per CXR--  on 10-27-2014 pt states finished antibiotic and denies cough or fever  . Hyperlipidemia   . History of MI (myocardial infarction)     10/ 2013 in setting of Septic Shock  . History of atrial fibrillation     10/ 2013  in setting of Septic Shock  . History of CHF (congestive heart failure)     10/ 2013 in setting of septic shock  . Sigmoid diverticulosis   . Nephrolithiasis     bilateral  . CKD (chronic kidney disease), stage III     nephrologist-  dr Florene Glen  . Right ureteral stone   . History of acute respiratory failure     10/ 2013  -- ventilated in setting septic shock  . Dysrhythmia     Afib in 2013 when she had septic shock related to stone ureteral obstruction  . Myocardial infarction [facility])     Was reported in 2013 during hospitalization with septic shock  . Arthritis     knee's  . Foley catheter in place   . Septic shock ([place]) 04/04/2012  . Sepsis [facility])     Past Surgical History  Procedure Laterality Date  . Cystoscopy w/ ureteral stent placement  04/04/2012    Procedure: CYSTOSCOPY WITH RETROGRADE PYELOGRAM/URETERAL STENT PLACEMENT;  Surgeon: Ailene Rud, MD;  Location: [place];  Service: Urology;  Laterality: Left;  . Total abdominal hysterectomy w/ bilateral salpingoophorectomy  1993    secondary to fibroids  . Breast biopsy Left 08/23/07    benign  fibrocystic with duct ectasia  . Cardiac catheterization  07-11-2012  dr Irish Lack    Abnormal stress test/   normal coronary arteries/  LVEDP  79mmHg  . Cardiovascular stress test  06-26-2012  dr Irish Lack    marked ischemia in the basal anterior, mid anterior, apical inferior regions/  normal LVF, ef 63%  . Transthoracic echocardiogram  04-09-2012    normal LVF,  ef 60-65%,  mild LAE,  mild TR, trivial MR and PR  . Cataract extraction w/ intraocular lens  implant, bilateral    . Knee arthroscopy Left 02-14-2003  . Laparoscopic cholecystectomy  03-23-2005  . Extracorporeal shock wave lithotripsy  05-28-2012  &  10-08-2012  . Cystoscopy with stent placement Right 10/28/2014    Procedure: RIGHT URETERAL STENT PLACEMENT;  Surgeon: Irine Seal, MD;  Location: [facility];  Service: Urology;  Laterality: Right;  . Cystoscopy/retrograde/ureteroscopy/stone extraction with basket Right 11/21/2014    Procedure: CYSTOSCOPY/RIGHT RETROGRADE PYELOGRAM/RIGHT URETEROSCOPY/BASKET EXTRACTION/RIGHT PYELOSCOPY/LASER OF STONE/RIGHT DOUBLE J STENT;  Surgeon: Carolan Clines, MD;  Location: [place];  Service: Urology;  Laterality: Right;  . Holmium laser application Right [id number]    Procedure: HOLMIUM LASER APPLICATION;  Surgeon: Carolan Clines, MD;  Location: [facility];  Service: Urology;  Laterality: Right;  .  Cystoscopy with stent placement Right 02/26/2015    Procedure: CYSTOSCOPY RETROGRADE PYELOGRAM WITH STENT PLACEMENT;  Surgeon: Cleon Gustin, MD;  Location: [facility];  Service: Urology;  Laterality: Right;    Allergies: Vicodin  Medications: Prior to Admission medications   Medication Sig Start Date End Date Taking? Authorizing Provider  acetaminophen (TYLENOL) 500 MG tablet Take 500 mg by mouth every 6 (six) hours as needed for moderate pain.   Yes Historical Provider, MD  bimatoprost (LUMIGAN) 0.01 % SOLN Place 1 drop into both eyes at bedtime.    Yes Historical Provider, MD  metoprolol tartrate (LOPRESSOR) 25 MG tablet Take 1 tablet (25 mg total) by mouth 2 (two) times daily. Patient taking differently: Take 25 mg by mouth daily.  11/21/14  Yes Jettie Booze, MD  ondansetron (ZOFRAN ODT) 4 MG disintegrating tablet Take 1 tablet (4 mg total) by mouth every 8 (eight) hours as needed for nausea or vomiting. 11/27/14  Yes Velvet Bathe, MD  ranitidine (ZANTAC) 150 MG tablet Take 150 mg by mouth 2 (two) times daily as needed for heartburn.   Yes Historical Provider, MD  timolol (BETIMOL) 0.5 % ophthalmic solution Place 1 drop into both eyes every morning.    Yes Historical Provider, MD  amLODipine (NORVASC) 5 MG tablet Take 1 tablet (5 mg total) by mouth every evening. Patient not taking: Reported on 02/25/2015 11/21/14   Jettie Booze, MD  cephALEXin (KEFLEX) 500 MG capsule Take 1 capsule (500 mg total) by mouth 2 (two) times daily. Patient not taking: Reported on 04/06/2015 03/01/15   Oswald Hillock, MD     Family History  Problem Relation Age of Onset  . Hypertension Mother   . Cancer Mother 46    breast  . Dementia Mother   . Hypertension Brother   . Diabetes Brother   . Cancer Father 44    pancreatic  . Heart failure Paternal Grandmother   . Bladder Cancer Maternal Grandfather     Social History   Social History  . Marital Status: Widowed    Spouse Name: N/A  . Number of Children: 1  . Years of Education: N/A   Occupational History  . retired   . legal assistant    Social History Main Topics  . Smoking status: Never Smoker   . Smokeless tobacco: Never Used  . Alcohol Use: No  . Drug Use: No  . Sexual Activity: No     Comment: widow husband passed 5/05 with lung cancer   Other Topics Concern  . None   Social History Narrative     Review of Systems: A 12 point ROS discussed and pertinent positives are indicated in the HPI above.  All other systems are negative.  Review of Systems  Constitutional:  Positive for activity change, appetite change and fatigue. Negative for fever.  Respiratory: Positive for shortness of breath.   Gastrointestinal: Positive for nausea and vomiting.  Musculoskeletal: Negative for back pain.  Neurological: Positive for weakness.  Psychiatric/Behavioral: Negative for behavioral problems and confusion.    Vital Signs: BP 118/53 mmHg  Pulse 108  Temp(Src) 97.6 F (36.4 C) (Oral)  Resp 18  Ht 5\' 3"  (1.6 m)  Wt 182 lb 1.6 oz (82.6 kg)  BMI 32.27 kg/m2  SpO2 95%  LMP 01/19/1992  Physical Exam  Constitutional: She is oriented to person, place, and time. She appears well-nourished.  Cardiovascular: Normal rate, regular rhythm and normal heart sounds.   Pulmonary/Chest: Effort normal and  breath sounds normal. She has no wheezes.  Abdominal: Soft. Bowel sounds are normal. There is tenderness.  Musculoskeletal: Normal range of motion.  Neurological: She is alert and oriented to person, place, and time.  Skin: Skin is warm and dry.  Psychiatric: She has a normal mood and affect. Her behavior is normal. Judgment and thought content normal.  Nursing note and vitals reviewed.   Mallampati Score:  MD Evaluation Airway: WNL Heart: WNL Abdomen: WNL Chest/ Lungs: WNL ASA  Classification: 3 Mallampati/Airway Score: One  Imaging: Nm Renal Imaging Flow W/pharm  04/08/2015  CLINICAL DATA:  Acute kidney disease. History of left renal atrophy and chronic right hydronephrosis. EXAM: NUCLEAR MEDICINE RENAL SCAN WITH DIURETIC ADMINISTRATION TECHNIQUE: Radionuclide angiographic and sequential renal images were obtained after intravenous injection of radiopharmaceutical. Imaging was continued during slow intravenous injection of Lasix approximately 15 minutes after the start of the examination. RADIOPHARMACEUTICALS:  13.9 mCi Technetium-50m MAG3 IV COMPARISON:  None. FINDINGS: Flow: Decreased perfusion to the smaller left kidney. Normal perfusion to the right kidney.  Left renogram: Diminished cortical uptake by the small left kidney. There is delayed excretion of the radiopharmaceutical. Right renogram: Normal cortical uptake. There is diminished clearance of the radiopharmaceutical from the dilated right renal collecting system. Differential: Left kidney = 28 % Right kidney = 72 % T1/2 post Lasix : Left kidney = not achieved min Right kidney = not achieved. Min IMPRESSION: 1. Diminished left renal function. There is delayed perfusion, cortical uptake and excretion of the radiopharmaceutical by the small left kidney. 2. Delayed and diminished clearance of the radiopharmaceutical from the dilated right renal collecting system compatible with partial obstruction. 3. Split renal function is equal to 72% from the right kidney and 28% from the left kidney. Electronically Signed   By: Kerby Moors M.D.   On: 04/08/2015 16:34   Ct Renal Stone Study  04/06/2015  CLINICAL DATA:  Acute onset of renal failure. Nausea. Recent placement of right ureteral stent for obstructing ureteral stone. Initial encounter. EXAM: CT ABDOMEN AND PELVIS WITHOUT CONTRAST TECHNIQUE: Multidetector CT imaging of the abdomen and pelvis was performed following the standard protocol without IV contrast. COMPARISON:  CT of the abdomen and pelvis performed 02/20/2015, and renal ultrasound performed 02/27/2015 FINDINGS: The visualized lung bases are clear. A large hiatal hernia is noted. Mild calcification is noted at the mitral valve. The liver and spleen are unremarkable in appearance. The patient is status post cholecystectomy, with clips noted at the gallbladder fossa. The pancreas and adrenal glands are unremarkable. There is mild right-sided and minimal left-sided hydronephrosis, with diffuse wall thickening at the renal calyces and pelves, extending along both ureters, and mild perinephric stranding. Findings are concerning for pyelonephritis and ureteritis. A right ureteral stent is noted in expected  position. No obstructing ureteral stones are seen. There is wall thickening about the bladder, with associated soft tissue inflammation, reflecting underlying cystitis. No free fluid is identified. The small bowel is unremarkable in appearance. The stomach is within normal limits. No acute vascular abnormalities are seen. The appendix is normal in caliber, without evidence of appendicitis. Mild scattered diverticulosis is noted along the transverse, descending and proximal sigmoid colon, without evidence of diverticulitis. There is a relatively broad-based moderate anterior abdominal wall hernia extending into the pannus, containing only fat. A small periumbilical hernia is also noted, to the right of the umbilicus, containing only fat. The patient is status post hysterectomy. No suspicious adnexal masses are seen. No inguinal lymphadenopathy is seen.  No acute osseous abnormalities are identified. There is grade 1 retrolisthesis of L2 on L3, with associated vacuum phenomenon and endplate sclerotic change, and minimal grade 1 retrolisthesis of L3 on L4. IMPRESSION: 1. Mild right-sided and minimal left-sided hydronephrosis, with diffuse wall thickening at the renal calyces and pelves, extending along both ureters, and mild perinephric stranding. Findings concerning for bilateral pyelonephritis and ureteritis, somewhat more prominent than in September. Right ureteral stent noted in expected position. No obstructing ureteral stone seen. 2. Diffuse bladder wall thickening, with associated soft tissue inflammation, concerning for underlying cystitis. 3. Large hiatal hernia noted. 4. Mild scattered diverticulosis along the transverse, descending and proximal sigmoid colon, without evidence of diverticulitis. 5. Relatively broad-based moderate anterior abdominal wall hernia extending into the pannus, containing only fat. Small periumbilical hernia to the right of the umbilicus, also containing only fat. 6. Mild degenerative  change at the mid lumbar spine. Electronically Signed   By: Garald Balding M.D.   On: 04/06/2015 22:21    Labs:  CBC:  Recent Labs  04/06/15 2108 04/07/15 0200 04/08/15 0545 04/08/15 0915 04/09/15 0525  WBC 11.2* 11.2*  --  6.9 10.6*  HGB 9.1* 7.6*  --  7.3* 8.1*  HCT 29.5* 24.8* 23.6* 23.5* 26.2*  PLT 493* 418*  --  350 392    COAGS:  Recent Labs  11/23/14 0305 02/26/15 0015  INR 1.31 1.17  APTT  --  33    BMP:  Recent Labs  04/06/15 2108 04/07/15 0200 04/08/15 0545 04/09/15 0525  NA 135 136 138 140  K 4.3 4.0 3.7 3.2*  CL 106 109 108 98*  CO2 20* 17* 20* 29  GLUCOSE 119* 100* 111* 108*  BUN 86* 85* 73* 62*  CALCIUM 9.6 9.1 8.6* 8.7*  CREATININE 8.02* 7.35* 6.48* 5.76*  GFRNONAA 4* 5* 6* 7*  GFRAA 5* 6* 7* 8*    LIVER FUNCTION TESTS:  Recent Labs  11/22/14 1945 11/23/14 0305 02/25/15 1930 04/06/15 2108  BILITOT 0.5 0.3 0.4 0.5  AST 22 16 18 17   ALT 27 20 12* 15  ALKPHOS 166* 133* 82 76  PROT 6.4* 5.4* 7.7 7.2  ALBUMIN 2.4* 1.9* 3.6 3.1*    TUMOR MARKERS: No results for input(s): AFPTM, CEA, CA199, CHROMGRNA in the last 8760 hours.  Assessment and Plan:  Chronic Kidney disease 4-5 Uremia x 2 mo Now to start dialysis asap per Nephrology Scheduled for tunneled dialysis catheter in IR Risks and Benefits discussed with the patient including, but not limited to bleeding, infection, vascular injury, pneumothorax which may require chest tube placement, air embolism or even death All of the patient's questions were answered, patient is agreeable to proceed. Consent signed and in chart.   Thank you for this interesting consult.  I greatly enjoyed meeting [name] and look forward to participating in their care.  A copy of this report was sent to the requesting provider on this date.  Signed: Jamil Castillo A 04/10/2015, 8:12 AM   I spent a total of 20 Minutes    in face to face in clinical consultation, greater than 50% of which was  counseling/coordinating care for HD catheter- tunneled

## 2015-04-10 NOTE — Progress Notes (Signed)
Assessment: 1 CKD stage 4 with AKI and uremia  3 Nephroureterolithiasis/ R hydro - s/p R JJ stent sept '16 Radionucleotide study show partial ureteral obstruction on right 4 HTN on norvasc/ mtp at home, no BP meds here. BP's low normal 5 AFib 6 Hx MI 7 Pyuria - recurrent problem, on Rocephin, multiple colonies on culture 8 Anemia - checkFe  Plan - Hemodialysis today  Subjective: Interval History: nausea and malaise  Objective: Vital signs in last 24 hours: Temp:  [97.6 F (36.4 C)-99.2 F (37.3 C)] 99.2 F (37.3 C) (10/21 0800) Pulse Rate:  [86-108] 100 (10/21 1008) Resp:  [8-26] 26 (10/21 1008) BP: (112-127)/(53-64) 119/61 mmHg (10/21 1008) SpO2:  [89 %-97 %] 93 % (10/21 1008) Weight:  [82.6 kg (182 lb 1.6 oz)] 82.6 kg (182 lb 1.6 oz) (10/20 2008) Weight change:   Intake/Output from previous day: 10/20 0701 - 10/21 0700 In: 720 [P.O.:120; I.V.:550; IV Piggyback:50] Out: 150 [Urine:150] Intake/Output this shift: Total I/O In: 0  Out: 150 [Urine:150]  General appearance: alert and cooperative Resp: clear to auscultation bilaterally Cardio: regular rate and rhythm, S1, S2 normal, no murmur, click, rub or gallop Extremities: edema tr  Lab Results:  Recent Labs  04/08/15 0915 04/09/15 0525  WBC 6.9 10.6*  HGB 7.3* 8.1*  HCT 23.5* 26.2*  PLT 350 392   BMET:  Recent Labs  04/08/15 0545 04/09/15 0525  NA 138 140  K 3.7 3.2*  CL 108 98*  CO2 20* 29  GLUCOSE 111* 108*  BUN 73* 62*  CREATININE 6.48* 5.76*  CALCIUM 8.6* 8.7*   No results for input(s): PTH in the last 72 hours. Iron Studies:  Recent Labs  04/08/15 0915  IRON 34  TIBC 211*  FERRITIN 166   Studies/Results: Nm Renal Imaging Flow W/pharm  04/08/2015  CLINICAL DATA:  Acute kidney disease. History of left renal atrophy and chronic right hydronephrosis. EXAM: NUCLEAR MEDICINE RENAL SCAN WITH DIURETIC ADMINISTRATION TECHNIQUE: Radionuclide angiographic and sequential renal images were  obtained after intravenous injection of radiopharmaceutical. Imaging was continued during slow intravenous injection of Lasix approximately 15 minutes after the start of the examination. RADIOPHARMACEUTICALS:  13.9 mCi Technetium-26m MAG3 IV COMPARISON:  None. FINDINGS: Flow: Decreased perfusion to the smaller left kidney. Normal perfusion to the right kidney. Left renogram: Diminished cortical uptake by the small left kidney. There is delayed excretion of the radiopharmaceutical. Right renogram: Normal cortical uptake. There is diminished clearance of the radiopharmaceutical from the dilated right renal collecting system. Differential: Left kidney = 28 % Right kidney = 72 % T1/2 post Lasix : Left kidney = not achieved min Right kidney = not achieved. Min IMPRESSION: 1. Diminished left renal function. There is delayed perfusion, cortical uptake and excretion of the radiopharmaceutical by the small left kidney. 2. Delayed and diminished clearance of the radiopharmaceutical from the dilated right renal collecting system compatible with partial obstruction. 3. Split renal function is equal to 72% from the right kidney and 28% from the left kidney. Electronically Signed   By: Kerby Moors M.D.   On: 04/08/2015 16:34   Ir Fluoro Guide Cv Line Right  04/10/2015  CLINICAL DATA:  End-stage renal disease, hypertension, no current access to begin dialysis. EXAM: ULTRASOUND GUIDANCE FOR VASCULAR ACCESS RIGHT INTERNAL JUGULAR PERMANENT HEMODIALYSIS CATHETER Date:  10/21/201610/21/2016 10:12 am Radiologist:  M. Daryll Brod, MD Guidance:  Ultrasound and fluoroscopic FLUOROSCOPY TIME:  30 seconds, 10.7 mGy MEDICATIONS AND MEDICAL HISTORY: 2 g Ancef administered within 1 hour  of the procedure, 1 mg Versed, 50 mcg fentanyl ANESTHESIA/SEDATION: 15 minutes CONTRAST:  None. COMPLICATIONS: None immediate PROCEDURE: Informed consent was obtained from the patient following explanation of the procedure, risks, benefits and  alternatives. The patient understands, agrees and consents for the procedure. All questions were addressed. A time out was performed. Maximal barrier sterile technique utilized including caps, mask, sterile gowns, sterile gloves, large sterile drape, hand hygiene, and 2% chlorhexidine scrub. Under sterile conditions and local anesthesia, right internal jugular micropuncture venous access was performed with ultrasound. Images were obtained for documentation. A guide wire was inserted followed by a transitional dilator. Next, a 0.035 guidewire was advanced into the IVC with a 5-French catheter. Measurements were obtained from the right venotomy site to the proximal right atrium. In the right infraclavicular chest, a subcutaneous tunnel was created under sterile conditions and local anesthesia. 1% lidocaine with epinephrine was utilized for this. The 19 cm tip to cuff palindrome catheter was tunneled subcutaneously to the venotomy site and inserted into the SVC/RA junction through a valved peel-away sheath. Position was confirmed with fluoroscopy. Images were obtained for documentation. Blood was aspirated from the catheter followed by saline and heparin flushes. The appropriate volume and strength of heparin was instilled in each lumen. Caps were applied. The catheter was secured at the tunnel site with Gelfoam and a pursestring suture. The venotomy site was closed with subcuticular Vicryl suture. Dermabond was applied to the small right neck incision. A dry sterile dressing was applied. The catheter is ready for use. No immediate complications. IMPRESSION: Ultrasound and fluoroscopically guided right internal jugular tunneled hemodialysis catheter (Palindrome catheter). Electronically Signed   By: Jerilynn Mages.  Shick M.D.   On: 04/10/2015 11:03   Ir US Guide Vasc Access Right  04/10/2015  CLINICAL DATA:  End-stage renal disease, hypertension, no current access to begin dialysis. EXAM: ULTRASOUND GUIDANCE FOR VASCULAR  ACCESS RIGHT INTERNAL JUGULAR PERMANENT HEMODIALYSIS CATHETER Date:  10/21/201610/21/2016 10:12 am Radiologist:  M. Daryll Brod, MD Guidance:  Ultrasound and fluoroscopic FLUOROSCOPY TIME:  30 seconds, 10.7 mGy MEDICATIONS AND MEDICAL HISTORY: 2 g Ancef administered within 1 hour of the procedure, 1 mg Versed, 50 mcg fentanyl ANESTHESIA/SEDATION: 15 minutes CONTRAST:  None. COMPLICATIONS: None immediate PROCEDURE: Informed consent was obtained from the patient following explanation of the procedure, risks, benefits and alternatives. The patient understands, agrees and consents for the procedure. All questions were addressed. A time out was performed. Maximal barrier sterile technique utilized including caps, mask, sterile gowns, sterile gloves, large sterile drape, hand hygiene, and 2% chlorhexidine scrub. Under sterile conditions and local anesthesia, right internal jugular micropuncture venous access was performed with ultrasound. Images were obtained for documentation. A guide wire was inserted followed by a transitional dilator. Next, a 0.035 guidewire was advanced into the IVC with a 5-French catheter. Measurements were obtained from the right venotomy site to the proximal right atrium. In the right infraclavicular chest, a subcutaneous tunnel was created under sterile conditions and local anesthesia. 1% lidocaine with epinephrine was utilized for this. The 19 cm tip to cuff palindrome catheter was tunneled subcutaneously to the venotomy site and inserted into the SVC/RA junction through a valved peel-away sheath. Position was confirmed with fluoroscopy. Images were obtained for documentation. Blood was aspirated from the catheter followed by saline and heparin flushes. The appropriate volume and strength of heparin was instilled in each lumen. Caps were applied. The catheter was secured at the tunnel site with Gelfoam and a pursestring suture. The venotomy site was  closed with subcuticular Vicryl suture.  Dermabond was applied to the small right neck incision. A dry sterile dressing was applied. The catheter is ready for use. No immediate complications. IMPRESSION: Ultrasound and fluoroscopically guided right internal jugular tunneled hemodialysis catheter (Palindrome catheter). Electronically Signed   By: Jerilynn Mages.  Shick M.D.   On: 04/10/2015 11:03   Scheduled: . ceFAZolin      . cefTRIAXone (ROCEPHIN)  IV  1 g Intravenous Q24H  . feeding supplement  1 Container Oral TID BM  . fentaNYL      . gelatin adsorbable      . heparin      . heparin  5,000 Units Subcutaneous 3 times per day  . latanoprost  1 drop Both Eyes QHS  . midazolam      . saccharomyces boulardii  250 mg Oral BID  . timolol  1 drop Both Eyes q morning - 10a     LOS: 4 days   Deetta Siegmann C 04/10/2015,2:01 PM

## 2015-04-10 NOTE — Progress Notes (Signed)
[facility]                                                                              Patient Demographics  [name], is a 69 y.o. female, DOB - March 02, 1946, XT:[id number]  Admit date - 04/06/2015   Admitting Physician Waldemar Dickens, MD  Outpatient Primary MD for the patient is [name], MD  LOS - 4   No chief complaint on file.     HPI on 04/07/2015 by Dr. Linna Darner [name] is a 69 y.o. female  Patient was seen today by her primary care physician who drew routine labs admitting: The patient to report elevated creatinine and ask her to go to the emergency room for further evaluation. With the exception of one-week history of intermittent nausea, dysuria, and left lower back pain patient states she's been in her normal state of health and had no inkling of any underlying illness or recent echo the emergency room. Patient has not done anything for her symptoms. Symptoms are not better or worsens onset. Patient had a right ureteral stent placed on 02/26/2015. She states she's had no palpitations procedure. She sees Dr. Florene Glen is her nephrologist as outpatient.  Interim history Patient felt to be uremic for 2 months and requires HD.  Transferred from [facility] to [facility].    Assessment & Plan   Acute on chronic kidney disease, Stage IV-V with uremia -Upon admission, Cr 8.02. Trending downward to 5.76 -Nephrology consulted and appreciated -IR consulted for placement of HD cath -Patient is to start HD today -Vascular consulted for access  Pyelonephritis/UTI -CT abdomen and pelvis noted perinephric stranding -Patient has had some back pain currently afebrile, WBCs normalized. -urine culture showed multiple species present -Continue Rocephin -Blood cultures show no growth to date   Nephroureterolithiasis/right hydronephrosis -Patient did have stent placed by Dr. Gaynelle Arabian approximately one month ago  Nausea -Likely secondary to uremia -Continue  antiemetics as needed   Anemia of chronic disease -Hemoglobin currently 8.1, continue to monitor CBC   Hypertension -Stable, metoprolol and amlodipine held   Code Status: Full  Family Communication: Daughter at bedside  Disposition Plan: Admitted.   Time Spent in minutes   30 minutes  Procedures  Renal Scan IR placement of RIJ HD cath  Consults   Nephrology Interventional radiology Vascular surgery  DVT Prophylaxis  heparin  Lab Results  Component Value Date   PLT 392 04/09/2015    Medications  Scheduled Meds: . ceFAZolin      . cefTRIAXone (ROCEPHIN)  IV  1 g Intravenous Q24H  . feeding supplement  1 Container Oral TID BM  . fentaNYL      . gelatin adsorbable      . heparin      . heparin  5,000 Units Subcutaneous 3 times per day  . latanoprost  1 drop Both Eyes QHS  . midazolam      . saccharomyces boulardii  250 mg Oral BID  . timolol  1 drop Both Eyes q morning - 10a   Continuous Infusions: . dextrose 5 % 1,000 mL with sodium bicarbonate 150 mEq infusion 50 mL/hr at 04/09/15 1615   PRN Meds:.sodium chloride,  sodium chloride, acetaminophen **OR** acetaminophen, acetaminophen **OR** acetaminophen, alteplase, calcium carbonate (dosed in mg elemental calcium), camphor-menthol **AND** hydrOXYzine, docusate sodium, feeding supplement (NEPRO CARB STEADY), heparin, heparin, lidocaine (PF), lidocaine-prilocaine, ondansetron **OR** ondansetron (ZOFRAN) IV, ondansetron **OR** ondansetron (ZOFRAN) IV, oxyCODONE, pentafluoroprop-tetrafluoroeth, prochlorperazine, sorbitol, zolpidem  Antibiotics    Anti-infectives    Start     Dose/Rate Route Frequency Ordered Stop   04/10/15 0943  ceFAZolin (ANCEF) 2-3 GM-% IVPB SOLR    Comments:  Soyla Dryer   : cabinet override      04/10/15 0943 04/10/15 2159   04/07/15 2200  cefTRIAXone (ROCEPHIN) 1 g in dextrose 5 % 50 mL IVPB     1 g 100 mL/hr over 30 Minutes Intravenous Every 24 hours 04/07/15 0042     04/06/15 2230   cefTRIAXone (ROCEPHIN) 1 g in dextrose 5 % 50 mL IVPB     1 g 100 mL/hr over 30 Minutes Intravenous  Once 04/06/15 2221 04/06/15 2312        Subjective:   [name] seen and examined today.  Patient continues to complain of headache and nausea.  Denies vomiting, abdominal pain.  Patient denies dizziness, chest pain, shortness of breath.  Objective:   Filed Vitals:   04/10/15 0952 04/10/15 0957 04/10/15 1002 04/10/15 1008  BP: 112/58 116/54 115/64 119/61  Pulse: 93 95 92 100  Temp:      TempSrc:      Resp: 8 14 15 26   Height:      Weight:      SpO2: 91% 97% 97% 93%    Wt Readings from Last 3 Encounters:  04/09/15 82.6 kg (182 lb 1.6 oz)  03/01/15 90.357 kg (199 lb 3.2 oz)  11/24/14 100.2 kg (220 lb 14.4 oz)     Intake/Output Summary (Last 24 hours) at 04/10/15 1233 Last data filed at 04/10/15 1158  Gross per 24 hour  Intake    720 ml  Output    300 ml  Net    420 ml    Exam  General: Well developed, well nourished, NAD, appears stated age  61: NCAT,  mucous membranes moist.   Cardiovascular: S1 S2 auscultated, no rubs, murmurs or gallops  Respiratory: Clear to auscultation bilaterally with equal chest rise  Abdomen: Soft, nontender, nondistended, + bowel sounds  Extremities: warm dry without cyanosis clubbing or edema  Neuro: AAOx3, nonfocal  Psych: Normal affect and demeanor with intact judgement and insight  Data Review   Micro Results Recent Results (from the past 240 hour(s))  Urine culture     Status: None   Collection Time: 04/06/15  9:46 PM  Result Value Ref Range Status   Specimen Description URINE, CLEAN CATCH  Final   Special Requests NONE  Final   Culture   Final    MULTIPLE SPECIES PRESENT, SUGGEST RECOLLECTION Performed at [facility]    Report Status 04/08/2015 FINAL  Final  Culture, blood (routine x 2)     Status: None (Preliminary result)   Collection Time: 04/07/15  1:55 AM  Result Value Ref Range Status    Specimen Description BLOOD LEFT ARM  Final   Special Requests BOTTLES DRAWN AEROBIC AND ANAEROBIC 10CC  Final   Culture   Final    NO GROWTH 2 DAYS Performed at [facility]    Report Status PENDING  Incomplete  Culture, blood (routine x 2)     Status: None (Preliminary result)   Collection Time: 04/07/15  2:00 AM  Result Value Ref Range Status   Specimen Description BLOOD LEFT HAND  Final   Special Requests BOTTLES DRAWN AEROBIC AND ANAEROBIC 10CC  Final   Culture   Final    NO GROWTH 2 DAYS Performed at [facility]    Report Status PENDING  Incomplete    Radiology Reports Nm Renal Imaging Flow W/pharm  04/08/2015  CLINICAL DATA:  Acute kidney disease. History of left renal atrophy and chronic right hydronephrosis. EXAM: NUCLEAR MEDICINE RENAL SCAN WITH DIURETIC ADMINISTRATION TECHNIQUE: Radionuclide angiographic and sequential renal images were obtained after intravenous injection of radiopharmaceutical. Imaging was continued during slow intravenous injection of Lasix approximately 15 minutes after the start of the examination. RADIOPHARMACEUTICALS:  13.9 mCi Technetium-44m MAG3 IV COMPARISON:  None. FINDINGS: Flow: Decreased perfusion to the smaller left kidney. Normal perfusion to the right kidney. Left renogram: Diminished cortical uptake by the small left kidney. There is delayed excretion of the radiopharmaceutical. Right renogram: Normal cortical uptake. There is diminished clearance of the radiopharmaceutical from the dilated right renal collecting system. Differential: Left kidney = 28 % Right kidney = 72 % T1/2 post Lasix : Left kidney = not achieved min Right kidney = not achieved. Min IMPRESSION: 1. Diminished left renal function. There is delayed perfusion, cortical uptake and excretion of the radiopharmaceutical by the small left kidney. 2. Delayed and diminished clearance of the radiopharmaceutical from the dilated right renal collecting system compatible with  partial obstruction. 3. Split renal function is equal to 72% from the right kidney and 28% from the left kidney. Electronically Signed   By: Kerby Moors M.D.   On: 04/08/2015 16:34   Ir Fluoro Guide Cv Line Right  04/10/2015  CLINICAL DATA:  End-stage renal disease, hypertension, no current access to begin dialysis. EXAM: ULTRASOUND GUIDANCE FOR VASCULAR ACCESS RIGHT INTERNAL JUGULAR PERMANENT HEMODIALYSIS CATHETER Date:  10/21/201610/21/2016 10:12 am Radiologist:  M. Daryll Brod, MD Guidance:  Ultrasound and fluoroscopic FLUOROSCOPY TIME:  30 seconds, 10.7 mGy MEDICATIONS AND MEDICAL HISTORY: 2 g Ancef administered within 1 hour of the procedure, 1 mg Versed, 50 mcg fentanyl ANESTHESIA/SEDATION: 15 minutes CONTRAST:  None. COMPLICATIONS: None immediate PROCEDURE: Informed consent was obtained from the patient following explanation of the procedure, risks, benefits and alternatives. The patient understands, agrees and consents for the procedure. All questions were addressed. A time out was performed. Maximal barrier sterile technique utilized including caps, mask, sterile gowns, sterile gloves, large sterile drape, hand hygiene, and 2% chlorhexidine scrub. Under sterile conditions and local anesthesia, right internal jugular micropuncture venous access was performed with ultrasound. Images were obtained for documentation. A guide wire was inserted followed by a transitional dilator. Next, a 0.035 guidewire was advanced into the IVC with a 5-French catheter. Measurements were obtained from the right venotomy site to the proximal right atrium. In the right infraclavicular chest, a subcutaneous tunnel was created under sterile conditions and local anesthesia. 1% lidocaine with epinephrine was utilized for this. The 19 cm tip to cuff palindrome catheter was tunneled subcutaneously to the venotomy site and inserted into the SVC/RA junction through a valved peel-away sheath. Position was confirmed with  fluoroscopy. Images were obtained for documentation. Blood was aspirated from the catheter followed by saline and heparin flushes. The appropriate volume and strength of heparin was instilled in each lumen. Caps were applied. The catheter was secured at the tunnel site with Gelfoam and a pursestring suture. The venotomy site was closed with subcuticular Vicryl suture. Dermabond was applied to the small  right neck incision. A dry sterile dressing was applied. The catheter is ready for use. No immediate complications. IMPRESSION: Ultrasound and fluoroscopically guided right internal jugular tunneled hemodialysis catheter (Palindrome catheter). Electronically Signed   By: Jerilynn Mages.  Shick M.D.   On: 04/10/2015 11:03   Ir US Guide Vasc Access Right  04/10/2015  CLINICAL DATA:  End-stage renal disease, hypertension, no current access to begin dialysis. EXAM: ULTRASOUND GUIDANCE FOR VASCULAR ACCESS RIGHT INTERNAL JUGULAR PERMANENT HEMODIALYSIS CATHETER Date:  10/21/201610/21/2016 10:12 am Radiologist:  M. Daryll Brod, MD Guidance:  Ultrasound and fluoroscopic FLUOROSCOPY TIME:  30 seconds, 10.7 mGy MEDICATIONS AND MEDICAL HISTORY: 2 g Ancef administered within 1 hour of the procedure, 1 mg Versed, 50 mcg fentanyl ANESTHESIA/SEDATION: 15 minutes CONTRAST:  None. COMPLICATIONS: None immediate PROCEDURE: Informed consent was obtained from the patient following explanation of the procedure, risks, benefits and alternatives. The patient understands, agrees and consents for the procedure. All questions were addressed. A time out was performed. Maximal barrier sterile technique utilized including caps, mask, sterile gowns, sterile gloves, large sterile drape, hand hygiene, and 2% chlorhexidine scrub. Under sterile conditions and local anesthesia, right internal jugular micropuncture venous access was performed with ultrasound. Images were obtained for documentation. A guide wire was inserted followed by a transitional dilator.  Next, a 0.035 guidewire was advanced into the IVC with a 5-French catheter. Measurements were obtained from the right venotomy site to the proximal right atrium. In the right infraclavicular chest, a subcutaneous tunnel was created under sterile conditions and local anesthesia. 1% lidocaine with epinephrine was utilized for this. The 19 cm tip to cuff palindrome catheter was tunneled subcutaneously to the venotomy site and inserted into the SVC/RA junction through a valved peel-away sheath. Position was confirmed with fluoroscopy. Images were obtained for documentation. Blood was aspirated from the catheter followed by saline and heparin flushes. The appropriate volume and strength of heparin was instilled in each lumen. Caps were applied. The catheter was secured at the tunnel site with Gelfoam and a pursestring suture. The venotomy site was closed with subcuticular Vicryl suture. Dermabond was applied to the small right neck incision. A dry sterile dressing was applied. The catheter is ready for use. No immediate complications. IMPRESSION: Ultrasound and fluoroscopically guided right internal jugular tunneled hemodialysis catheter (Palindrome catheter). Electronically Signed   By: Jerilynn Mages.  Shick M.D.   On: 04/10/2015 11:03   Ct Renal Stone Study  04/06/2015  CLINICAL DATA:  Acute onset of renal failure. Nausea. Recent placement of right ureteral stent for obstructing ureteral stone. Initial encounter. EXAM: CT ABDOMEN AND PELVIS WITHOUT CONTRAST TECHNIQUE: Multidetector CT imaging of the abdomen and pelvis was performed following the standard protocol without IV contrast. COMPARISON:  CT of the abdomen and pelvis performed 02/20/2015, and renal ultrasound performed 02/27/2015 FINDINGS: The visualized lung bases are clear. A large hiatal hernia is noted. Mild calcification is noted at the mitral valve. The liver and spleen are unremarkable in appearance. The patient is status post cholecystectomy, with clips noted  at the gallbladder fossa. The pancreas and adrenal glands are unremarkable. There is mild right-sided and minimal left-sided hydronephrosis, with diffuse wall thickening at the renal calyces and pelves, extending along both ureters, and mild perinephric stranding. Findings are concerning for pyelonephritis and ureteritis. A right ureteral stent is noted in expected position. No obstructing ureteral stones are seen. There is wall thickening about the bladder, with associated soft tissue inflammation, reflecting underlying cystitis. No free fluid is identified. The small bowel is  unremarkable in appearance. The stomach is within normal limits. No acute vascular abnormalities are seen. The appendix is normal in caliber, without evidence of appendicitis. Mild scattered diverticulosis is noted along the transverse, descending and proximal sigmoid colon, without evidence of diverticulitis. There is a relatively broad-based moderate anterior abdominal wall hernia extending into the pannus, containing only fat. A small periumbilical hernia is also noted, to the right of the umbilicus, containing only fat. The patient is status post hysterectomy. No suspicious adnexal masses are seen. No inguinal lymphadenopathy is seen. No acute osseous abnormalities are identified. There is grade 1 retrolisthesis of L2 on L3, with associated vacuum phenomenon and endplate sclerotic change, and minimal grade 1 retrolisthesis of L3 on L4. IMPRESSION: 1. Mild right-sided and minimal left-sided hydronephrosis, with diffuse wall thickening at the renal calyces and pelves, extending along both ureters, and mild perinephric stranding. Findings concerning for bilateral pyelonephritis and ureteritis, somewhat more prominent than in September. Right ureteral stent noted in expected position. No obstructing ureteral stone seen. 2. Diffuse bladder wall thickening, with associated soft tissue inflammation, concerning for underlying cystitis. 3. Large  hiatal hernia noted. 4. Mild scattered diverticulosis along the transverse, descending and proximal sigmoid colon, without evidence of diverticulitis. 5. Relatively broad-based moderate anterior abdominal wall hernia extending into the pannus, containing only fat. Small periumbilical hernia to the right of the umbilicus, also containing only fat. 6. Mild degenerative change at the mid lumbar spine. Electronically Signed   By: Garald Balding M.D.   On: 04/06/2015 22:21    CBC  Recent Labs Lab 04/06/15 2108 04/07/15 0200 04/08/15 0545 04/08/15 0915 04/09/15 0525  WBC 11.2* 11.2*  --  6.9 10.6*  HGB 9.1* 7.6*  --  7.3* 8.1*  HCT 29.5* 24.8* 23.6* 23.5* 26.2*  PLT 493* 418*  --  350 392  MCV 87.3 86.1  --  86.7 85.9  MCH 26.9 26.4  --  26.9 26.6  MCHC 30.8 30.6  --  31.1 30.9  RDW 16.6* 16.4*  --  16.9* 16.7*  LYMPHSABS 1.6  --   --  1.0  --   MONOABS 0.6  --   --  0.4  --   EOSABS 0.3  --   --  0.3  --   BASOSABS 0.1  --   --  0.0  --     Chemistries   Recent Labs Lab 04/06/15 2108 04/07/15 0200 04/08/15 0545 04/09/15 0525  NA 135 136 138 140  K 4.3 4.0 3.7 3.2*  CL 106 109 108 98*  CO2 20* 17* 20* 29  GLUCOSE 119* 100* 111* 108*  BUN 86* 85* 73* 62*  CREATININE 8.02* 7.35* 6.48* 5.76*  CALCIUM 9.6 9.1 8.6* 8.7*  AST 17  --   --   --   ALT 15  --   --   --   ALKPHOS 76  --   --   --   BILITOT 0.5  --   --   --    ------------------------------------------------------------------------------------------------------------------ estimated creatinine clearance is 9.3 mL/min (by C-G formula based on Cr of 5.76). ------------------------------------------------------------------------------------------------------------------ No results for input(s): HGBA1C in the last 72 hours. ------------------------------------------------------------------------------------------------------------------ No results for input(s): CHOL, HDL, LDLCALC, TRIG, CHOLHDL, LDLDIRECT in the last  72 hours. ------------------------------------------------------------------------------------------------------------------ No results for input(s): TSH, T4TOTAL, T3FREE, THYROIDAB in the last 72 hours.  Invalid input(s): FREET3 ------------------------------------------------------------------------------------------------------------------  Recent Labs  04/08/15 0545 04/08/15 0915  VITAMINB12 458 396  FOLATE  --  9.2  FERRITIN  --  166  TIBC  --  211*  IRON  --  34  RETICCTPCT  --  1.9    Coagulation profile No results for input(s): INR, PROTIME in the last 168 hours.  No results for input(s): DDIMER in the last 72 hours.  Cardiac Enzymes No results for input(s): CKMB, TROPONINI, MYOGLOBIN in the last 168 hours.  Invalid input(s): CK ------------------------------------------------------------------------------------------------------------------ Invalid input(s): POCBNP    Lanay Zinda D.O. on 04/10/2015 at 12:33 PM  Between 7am to 7pm - Pager - [phone]  After 7pm go to [email or web address] - password [email or web address]  And look for the night coverage person covering for me after hours  [facility] Office  [phone]

## 2015-04-11 ENCOUNTER — Inpatient Hospital Stay (HOSPITAL_COMMUNITY): Payer: Medicare Other

## 2015-04-11 DIAGNOSIS — IMO0002 Reserved for concepts with insufficient information to code with codable children: Secondary | ICD-10-CM

## 2015-04-11 DIAGNOSIS — Z992 Dependence on renal dialysis: Secondary | ICD-10-CM

## 2015-04-11 HISTORY — DX: Reserved for concepts with insufficient information to code with codable children: IMO0002

## 2015-04-11 HISTORY — DX: Dependence on renal dialysis: Z99.2

## 2015-04-11 LAB — RENAL FUNCTION PANEL
Albumin: 2.2 g/dL — ABNORMAL LOW (ref 3.5–5.0)
Albumin: 2.5 g/dL — ABNORMAL LOW (ref 3.5–5.0)
Anion gap: 8 (ref 5–15)
Anion gap: 10 (ref 5–15)
BUN: 18 mg/dL (ref 6–20)
BUN: 19 mg/dL (ref 6–20)
CO2: 34 mmol/L — ABNORMAL HIGH (ref 22–32)
CO2: 35 mmol/L — ABNORMAL HIGH (ref 22–32)
Calcium: 7.8 mg/dL — ABNORMAL LOW (ref 8.9–10.3)
Calcium: 8.3 mg/dL — ABNORMAL LOW (ref 8.9–10.3)
Chloride: 93 mmol/L — ABNORMAL LOW (ref 101–111)
Chloride: 89 mmol/L — ABNORMAL LOW (ref 101–111)
Creatinine, Ser: 3.49 mg/dL — ABNORMAL HIGH (ref 0.44–1.00)
Creatinine, Ser: 4.09 mg/dL — ABNORMAL HIGH (ref 0.44–1.00)
GFR calc Af Amer: 14 mL/min — ABNORMAL LOW (ref >60)
GFR calc Af Amer: 12 mL/min — ABNORMAL LOW (ref >60)
GFR calc non Af Amer: 12 mL/min — ABNORMAL LOW (ref >60)
GFR calc non Af Amer: 10 mL/min — ABNORMAL LOW (ref >60)
Glucose, Bld: 113 mg/dL — ABNORMAL HIGH (ref 65–99)
Glucose, Bld: 112 mg/dL — ABNORMAL HIGH (ref 65–99)
Phosphorus: 2.1 mg/dL — ABNORMAL LOW (ref 2.5–4.6)
Phosphorus: 2.9 mg/dL (ref 2.5–4.6)
Potassium: 3.6 mmol/L (ref 3.5–5.1)
Potassium: 3.3 mmol/L — ABNORMAL LOW (ref 3.5–5.1)
Sodium: 135 mmol/L (ref 135–145)
Sodium: 134 mmol/L — ABNORMAL LOW (ref 135–145)

## 2015-04-11 LAB — CBC
HCT: 24.2 % — ABNORMAL LOW (ref 36.0–46.0)
HCT: 27.4 % — ABNORMAL LOW (ref 36.0–46.0)
Hemoglobin: 7.4 g/dL — ABNORMAL LOW (ref 12.0–15.0)
Hemoglobin: 8.1 g/dL — ABNORMAL LOW (ref 12.0–15.0)
MCH: 27 pg (ref 26.0–34.0)
MCH: 26.3 pg (ref 26.0–34.0)
MCHC: 30.6 g/dL (ref 30.0–36.0)
MCHC: 29.6 g/dL — ABNORMAL LOW (ref 30.0–36.0)
MCV: 88.3 fL (ref 78.0–100.0)
MCV: 89 fL (ref 78.0–100.0)
Platelets: 321 10*3/uL (ref 150–400)
Platelets: 367 10*3/uL (ref 150–400)
RBC: 2.74 MIL/uL — ABNORMAL LOW (ref 3.87–5.11)
RBC: 3.08 MIL/uL — ABNORMAL LOW (ref 3.87–5.11)
RDW: 16.8 % — ABNORMAL HIGH (ref 11.5–15.5)
RDW: 16.6 % — ABNORMAL HIGH (ref 11.5–15.5)
WBC: 7.3 10*3/uL (ref 4.0–10.5)
WBC: 6.9 10*3/uL (ref 4.0–10.5)

## 2015-04-11 LAB — HEPATITIS B SURFACE ANTIBODY,QUALITATIVE: Hep B S Ab: NONREACTIVE

## 2015-04-11 LAB — HEPATITIS B SURFACE ANTIGEN: Hepatitis B Surface Ag: NEGATIVE

## 2015-04-11 LAB — HEPATITIS B CORE ANTIBODY, TOTAL: Hep B Core Total Ab: NEGATIVE

## 2015-04-11 MED ORDER — FENTANYL CITRATE (PF) 100 MCG/2ML IJ SOLN
INTRAMUSCULAR | Status: AC
  Filled 2015-04-11: qty 4

## 2015-04-11 MED ORDER — DEXTROSE 5 % IV SOLN
1.0000 g | INTRAVENOUS | Status: DC
  Administered 2015-04-11 – 2015-04-12 (×2): 1 g via INTRAVENOUS
  Filled 2015-04-11 (×3): qty 10

## 2015-04-11 MED ORDER — SODIUM CHLORIDE 0.9 % IV SOLN
510.0000 mg | Freq: Once | INTRAVENOUS | Status: AC
  Administered 2015-04-11: 510 mg via INTRAVENOUS
  Filled 2015-04-11: qty 17

## 2015-04-11 MED ORDER — LIDOCAINE-PRILOCAINE 2.5-2.5 % EX CREA
1.0000 "application " | TOPICAL_CREAM | CUTANEOUS | Status: DC | PRN
  Filled 2015-04-11: qty 5

## 2015-04-11 MED ORDER — MIDAZOLAM HCL 2 MG/2ML IJ SOLN
INTRAMUSCULAR | Status: AC
  Filled 2015-04-11: qty 4

## 2015-04-11 MED ORDER — IOHEXOL 300 MG/ML  SOLN
50.0000 mL | Freq: Once | INTRAMUSCULAR | Status: DC | PRN
  Administered 2015-04-11: 15 mL
  Filled 2015-04-11: qty 50

## 2015-04-11 MED ORDER — FENTANYL CITRATE (PF) 100 MCG/2ML IJ SOLN
INTRAMUSCULAR | Status: AC | PRN
  Administered 2015-04-11: 25 ug via INTRAVENOUS
  Administered 2015-04-11: 50 ug via INTRAVENOUS

## 2015-04-11 MED ORDER — HEPARIN SODIUM (PORCINE) 1000 UNIT/ML DIALYSIS: 20.0000 [IU]/kg | INTRAMUSCULAR | Status: DC | PRN

## 2015-04-11 MED ORDER — PENTAFLUOROPROP-TETRAFLUOROETH EX AERO: 1.0000 "application " | INHALATION_SPRAY | CUTANEOUS | Status: DC | PRN

## 2015-04-11 MED ORDER — SALINE SPRAY 0.65 % NA SOLN
1.0000 | NASAL | Status: DC | PRN
  Administered 2015-04-12: 1 via NASAL
  Filled 2015-04-11: qty 44

## 2015-04-11 MED ORDER — MIDAZOLAM HCL 2 MG/2ML IJ SOLN
INTRAMUSCULAR | Status: AC | PRN
  Administered 2015-04-11: 0.5 mg via INTRAVENOUS
  Administered 2015-04-11: 1 mg via INTRAVENOUS

## 2015-04-11 MED ORDER — ALTEPLASE 2 MG IJ SOLR
2.0000 mg | Freq: Once | INTRAMUSCULAR | Status: DC | PRN
  Filled 2015-04-11: qty 2

## 2015-04-11 MED ORDER — SODIUM CHLORIDE 0.9 % IV SOLN: 100.0000 mL | INTRAVENOUS | Status: DC | PRN

## 2015-04-11 MED ORDER — LIDOCAINE HCL 1 % IJ SOLN
INTRAMUSCULAR | Status: AC
  Filled 2015-04-11: qty 20

## 2015-04-11 MED ORDER — LIDOCAINE HCL (PF) 1 % IJ SOLN: 5.0000 mL | INTRAMUSCULAR | Status: DC | PRN

## 2015-04-11 MED ORDER — HEPARIN SODIUM (PORCINE) 1000 UNIT/ML DIALYSIS: 1000.0000 [IU] | INTRAMUSCULAR | Status: DC | PRN

## 2015-04-11 MED ORDER — BUTALBITAL-APAP-CAFFEINE 50-325-40 MG PO TABS: 1.0000 | ORAL_TABLET | Freq: Four times a day (QID) | ORAL | Status: DC | PRN

## 2015-04-11 NOTE — Sedation Documentation (Signed)
Patient denies pain and is resting comfortably.  

## 2015-04-11 NOTE — Procedures (Signed)
Successful 93fr RT PCN INSERTION NO COMP STABLE TO GRAVITY BAG FULL REPORT IN PACS

## 2015-04-11 NOTE — Progress Notes (Signed)
Assessment: 1 CKD stage 4 with AKI and uremia, Plan Vein Mapping and AV access for future  2 Nephroureterolithiasis/ R hydro - s/p R JJ stent sept '16 Radionucleotide study show partial ureteral obstruction on right; will ask urology to exchange stent 4 HTN no BP meds here. BP's low normal 5 AFib 6 Hx MI 7 Pyuria - recurrent problem, on Rocephin, multiple colonies on culture 8 Anemia - Iron defic, give IV iron  Plan - Hemodialysis today again  Subjective: Interval History: Feels better post dialysis  Objective: Vital signs in last 24 hours: Temp:  [97.9 F (36.6 C)-98.5 F (36.9 C)] 98.3 F (36.8 C) (10/22 0823) Pulse Rate:  [90-100] 94 (10/22 0823) Resp:  [8-26] 16 (10/22 0823) BP: (101-119)/(48-69) 119/69 mmHg (10/22 0823) SpO2:  [91 %-97 %] 95 % (10/22 0823) Weight:  [82.3 kg (181 lb 7 oz)-82.4 kg (181 lb 10.5 oz)] 82.3 kg (181 lb 7 oz) (10/21 2200) Weight change: -0.2 kg (-7.1 oz)  Intake/Output from previous day: 10/21 0701 - 10/22 0700 In: 1130 [P.O.:480; I.V.:600; IV Piggyback:50] Out: 150 [Urine:150] Intake/Output this shift: Total I/O In: 240 [P.O.:240] Out: -   General appearance: alert and cooperative Resp: clear to auscultation bilaterally Chest wall: no tenderness Cardio: regular rate and rhythm, S1, S2 normal, no murmur, click, rub or gallop Extremities: edema tr  Lab Results:  Recent Labs  04/09/15 0525 04/11/15 0343  WBC 10.6* 7.3  HGB 8.1* 7.4*  HCT 26.2* 24.2*  PLT 392 321   BMET:  Recent Labs  04/10/15 1935 04/11/15 0343  NA 138 135  K 3.2* 3.6  CL 90* 93*  CO2 35* 34*  GLUCOSE 106* 113*  BUN 49* 18  CREATININE 5.79* 3.49*  CALCIUM 8.5* 7.8*   No results for input(s): PTH in the last 72 hours. Iron Studies: No results for input(s): IRON, TIBC, TRANSFERRIN, FERRITIN in the last 72 hours. Studies/Results: Ir Fluoro Guide Cv Line Right  04/10/2015  CLINICAL DATA:  End-stage renal disease, hypertension, no current access to  begin dialysis. EXAM: ULTRASOUND GUIDANCE FOR VASCULAR ACCESS RIGHT INTERNAL JUGULAR PERMANENT HEMODIALYSIS CATHETER Date:  10/21/201610/21/2016 10:12 am Radiologist:  M. Daryll Brod, MD Guidance:  Ultrasound and fluoroscopic FLUOROSCOPY TIME:  30 seconds, 10.7 mGy MEDICATIONS AND MEDICAL HISTORY: 2 g Ancef administered within 1 hour of the procedure, 1 mg Versed, 50 mcg fentanyl ANESTHESIA/SEDATION: 15 minutes CONTRAST:  None. COMPLICATIONS: None immediate PROCEDURE: Informed consent was obtained from the patient following explanation of the procedure, risks, benefits and alternatives. The patient understands, agrees and consents for the procedure. All questions were addressed. A time out was performed. Maximal barrier sterile technique utilized including caps, mask, sterile gowns, sterile gloves, large sterile drape, hand hygiene, and 2% chlorhexidine scrub. Under sterile conditions and local anesthesia, right internal jugular micropuncture venous access was performed with ultrasound. Images were obtained for documentation. A guide wire was inserted followed by a transitional dilator. Next, a 0.035 guidewire was advanced into the IVC with a 5-French catheter. Measurements were obtained from the right venotomy site to the proximal right atrium. In the right infraclavicular chest, a subcutaneous tunnel was created under sterile conditions and local anesthesia. 1% lidocaine with epinephrine was utilized for this. The 19 cm tip to cuff palindrome catheter was tunneled subcutaneously to the venotomy site and inserted into the SVC/RA junction through a valved peel-away sheath. Position was confirmed with fluoroscopy. Images were obtained for documentation. Blood was aspirated from the catheter followed by saline and heparin flushes.  The appropriate volume and strength of heparin was instilled in each lumen. Caps were applied. The catheter was secured at the tunnel site with Gelfoam and a pursestring suture. The  venotomy site was closed with subcuticular Vicryl suture. Dermabond was applied to the small right neck incision. A dry sterile dressing was applied. The catheter is ready for use. No immediate complications. IMPRESSION: Ultrasound and fluoroscopically guided right internal jugular tunneled hemodialysis catheter (Palindrome catheter). Electronically Signed   By: Jerilynn Mages.  Shick M.D.   On: 04/10/2015 11:03   Ir US Guide Vasc Access Right  04/10/2015  CLINICAL DATA:  End-stage renal disease, hypertension, no current access to begin dialysis. EXAM: ULTRASOUND GUIDANCE FOR VASCULAR ACCESS RIGHT INTERNAL JUGULAR PERMANENT HEMODIALYSIS CATHETER Date:  10/21/201610/21/2016 10:12 am Radiologist:  M. Daryll Brod, MD Guidance:  Ultrasound and fluoroscopic FLUOROSCOPY TIME:  30 seconds, 10.7 mGy MEDICATIONS AND MEDICAL HISTORY: 2 g Ancef administered within 1 hour of the procedure, 1 mg Versed, 50 mcg fentanyl ANESTHESIA/SEDATION: 15 minutes CONTRAST:  None. COMPLICATIONS: None immediate PROCEDURE: Informed consent was obtained from the patient following explanation of the procedure, risks, benefits and alternatives. The patient understands, agrees and consents for the procedure. All questions were addressed. A time out was performed. Maximal barrier sterile technique utilized including caps, mask, sterile gowns, sterile gloves, large sterile drape, hand hygiene, and 2% chlorhexidine scrub. Under sterile conditions and local anesthesia, right internal jugular micropuncture venous access was performed with ultrasound. Images were obtained for documentation. A guide wire was inserted followed by a transitional dilator. Next, a 0.035 guidewire was advanced into the IVC with a 5-French catheter. Measurements were obtained from the right venotomy site to the proximal right atrium. In the right infraclavicular chest, a subcutaneous tunnel was created under sterile conditions and local anesthesia. 1% lidocaine with epinephrine was  utilized for this. The 19 cm tip to cuff palindrome catheter was tunneled subcutaneously to the venotomy site and inserted into the SVC/RA junction through a valved peel-away sheath. Position was confirmed with fluoroscopy. Images were obtained for documentation. Blood was aspirated from the catheter followed by saline and heparin flushes. The appropriate volume and strength of heparin was instilled in each lumen. Caps were applied. The catheter was secured at the tunnel site with Gelfoam and a pursestring suture. The venotomy site was closed with subcuticular Vicryl suture. Dermabond was applied to the small right neck incision. A dry sterile dressing was applied. The catheter is ready for use. No immediate complications. IMPRESSION: Ultrasound and fluoroscopically guided right internal jugular tunneled hemodialysis catheter (Palindrome catheter). Electronically Signed   By: Jerilynn Mages.  Shick M.D.   On: 04/10/2015 11:03   Scheduled: . cefTRIAXone (ROCEPHIN)  IV  1 g Intravenous Q24H  . feeding supplement  1 Container Oral TID BM  . heparin  5,000 Units Subcutaneous 3 times per day  . latanoprost  1 drop Both Eyes QHS  . saccharomyces boulardii  250 mg Oral BID  . timolol  1 drop Both Eyes q morning - 10a     LOS: 5 days   Gaberiel Youngblood C 04/11/2015,9:45 AM

## 2015-04-11 NOTE — Progress Notes (Signed)
Subjective:Improved with acute dialysis=feeels better, but , after discussion with Merita Norton, consideration again for repeat drainage. Discussed with Dr. Reesa Chew in IR. Successful placement today. Will await Cr values.  Objective: Vital signs in last 24 hours: Temp:  [97.9 F (36.6 C)-98.5 F (36.9 C)] 98.3 F (36.8 C) (10/22 0823) Pulse Rate:  [80-99] 80 (10/22 1433) Resp:  [12-18] 12 (10/22 1433) BP: (101-137)/(48-69) 109/50 mmHg (10/22 1433) SpO2:  [94 %-96 %] 95 % (10/22 1433) Weight:  [82.3 kg (181 lb 7 oz)-82.4 kg (181 lb 10.5 oz)] 82.3 kg (181 lb 7 oz) (10/21 2200)A  Intake/Output from previous day: 10/21 0701 - 10/22 0700 In: 1130 [P.O.:480; I.V.:600; IV Piggyback:50] Out: 150 [Urine:150] Intake/Output this shift: Total I/O In: 420 [P.O.:420] Out: -   Past Medical History  Diagnosis Date  . Hypertension   . GERD (gastroesophageal reflux disease)   . Glaucoma   . H/O renal calculi 2002 & 2006  . H/O hiatal hernia   . Headache(784.0)     migraine  . Pneumonia     dx 10-06-2014 per CXR--  on 10-27-2014 pt states finished antibiotic and denies cough or fever  . Hyperlipidemia   . History of MI (myocardial infarction)     10/ 2013 in setting of Septic Shock  . History of atrial fibrillation     10/ 2013  in setting of Septic Shock  . History of CHF (congestive heart failure)     10/ 2013 in setting of septic shock  . Sigmoid diverticulosis   . Nephrolithiasis     bilateral  . CKD (chronic kidney disease), stage III     nephrologist-  dr Florene Glen  . Right ureteral stone   . History of acute respiratory failure     10/ 2013  -- ventilated in setting septic shock  . Dysrhythmia     Afib in 2013 when she had septic shock related to stone ureteral obstruction  . Myocardial infarction [facility])     Was reported in 2013 during hospitalization with septic shock  . Arthritis     knee's  . Foley catheter in place   . Septic shock ([place]) 04/04/2012  . Sepsis [facility])      Physical Exam:  Lungs - Normal respiratory effort, chest expands symmetrically.  Abdomen - Soft, non-tender & non-distended.  Lab Results:  Recent Labs  04/09/15 0525 04/11/15 0343 04/11/15 1450  WBC 10.6* 7.3 6.9  HGB 8.1* 7.4* 8.1*  HCT 26.2* 24.2* 27.4*   BMET  Recent Labs  04/10/15 1935 04/11/15 0343  NA 138 135  K 3.2* 3.6  CL 90* 93*  CO2 35* 34*  GLUCOSE 106* 113*  BUN 49* 18  CREATININE 5.79* 3.49*  CALCIUM 8.5* 7.8*   No results for input(s): LABURIN in the last 72 hours. Results for orders placed or performed during the hospital encounter of 04/06/15  Urine culture     Status: None   Collection Time: 04/06/15  9:46 PM  Result Value Ref Range Status   Specimen Description URINE, CLEAN CATCH  Final   Special Requests NONE  Final   Culture   Final    MULTIPLE SPECIES PRESENT, SUGGEST RECOLLECTION Performed at [facility]    Report Status 04/08/2015 FINAL  Final  Culture, blood (routine x 2)     Status: None (Preliminary result)   Collection Time: 04/07/15  1:55 AM  Result Value Ref Range Status   Specimen Description BLOOD LEFT ARM  Final  Special Requests BOTTLES DRAWN AEROBIC AND ANAEROBIC 10CC  Final   Culture   Final    NO GROWTH 4 DAYS Performed at [facility]    Report Status PENDING  Incomplete  Culture, blood (routine x 2)     Status: None (Preliminary result)   Collection Time: 04/07/15  2:00 AM  Result Value Ref Range Status   Specimen Description BLOOD LEFT HAND  Final   Special Requests BOTTLES DRAWN AEROBIC AND ANAEROBIC 10CC  Final   Culture   Final    NO GROWTH 4 DAYS Performed at [facility]    Report Status PENDING  Incomplete    Studies/Results: US Renal  04/11/2015  CLINICAL DATA:  Hydronephrosis, poorly functioning right ureteral stent, currently on dialysis, elevated creatinine EXAM: RENAL / URINARY TRACT ULTRASOUND COMPLETE COMPARISON:  04/06/2015 CT FINDINGS: Right Kidney: Length: 11.5 cm.  Normal echogenicity and cortical thickness. Ureteral stent extends into the upper pole by ultrasound. Moderate hydronephrosis evident. No focal abnormality. Left Kidney: Length: 9.5 cm. Mild cortical thinning but normal echogenicity. Mild left hydronephrosis and proximal hydroureter. Bladder: Appears normal for degree of bladder distention. IMPRESSION: Right greater than left hydronephrosis despite right ureteral stent insertion Electronically Signed   By: Jerilynn Mages.  Shick M.D.   On: 04/11/2015 13:53   Ir Nephrostomy Placement Right  04/11/2015  CLINICAL DATA:  PERSISTENT RIGHT HYDRONEPHROSIS DESPITE URETERAL STENT INSERTION. RENAL FAILURE, ELEVATED CREATININE. EXAM: ULTRASOUND FLUOROSCOPIC 10 FRENCH RIGHT NEPHROSTOMY INSERTION Date:  10/22/201610/22/2016 2:23 pm Radiologist:  M. Daryll Brod, MD Guidance:  Ultrasound and fluoroscopic FLUOROSCOPY TIME:  1 minutes 12 seconds, 60 mGy MEDICATIONS AND MEDICAL HISTORY: Patient is already receiving 1 g Rocephin every 24 hours. 1.5 mg Versed, 75 mcg fentanyl ANESTHESIA/SEDATION: 10 minutes CONTRAST:  3mL OMNIPAQUE IOHEXOL 300 MG/ML  SOLN COMPLICATIONS: None immediate PROCEDURE: Informed consent was obtained from the patient following explanation of the procedure, risks, benefits and alternatives. The patient understands, agrees and consents for the procedure. All questions were addressed. A time out was performed. Maximal barrier sterile technique utilized including caps, mask, sterile gowns, sterile gloves, large sterile drape, hand hygiene, and ChloraPrep. Under sterile conditions and local anesthesia, ultrasound micropuncture needle access performed of a right mid to lower pole dilated calyx. Needle position confirmed with ultrasound. Images obtained for documentation. There was return of cloudy exudative urine. Guidewire advanced easily into the renal pelvis. Accustick dilator set advanced. Amplatz guidewire exchange performed. Tract dilatation performed to advance a 10  French nephrostomy with the retention loop formed in the renal pelvis. Contrast injection confirms position. Gravity drainage bag connected. Catheter secured with a Prolene suture and a sterile dressing. No immediate complication. Patient tolerated the procedure well. IMPRESSION: Successful ultrasound and fluoroscopic 10 French right nephrostomy insertion. Electronically Signed   By: Jerilynn Mages.  Shick M.D.   On: 04/11/2015 14:33    Assessment: Pt improved post acute dialysis and now with Right perc drainage. We will follow Cr. Plan: follow Cr. Future determination re: JJ stent and changing perc.   Bernestine Holsapple I Courney Garrod 04/11/2015, 3:19 PM

## 2015-04-11 NOTE — Progress Notes (Signed)
HD orders for 04/11/2015 cancelled per Dr Erling Cruz.

## 2015-04-11 NOTE — Progress Notes (Signed)
[facility]                                                                              Patient Demographics  [name], is a 69 y.o. female, DOB - 12/05/1945, XT:[id number]  Admit date - 04/06/2015   Admitting Physician Waldemar Dickens, MD  Outpatient Primary MD for the patient is [name], MD  LOS - 5   No chief complaint on file.     HPI on 04/07/2015 by Dr. Linna Darner [name] is a 69 y.o. female  Patient was seen today by her primary care physician who drew routine labs admitting: The patient to report elevated creatinine and ask her to go to the emergency room for further evaluation. With the exception of one-week history of intermittent nausea, dysuria, and left lower back pain patient states she's been in her normal state of health and had no inkling of any underlying illness or recent echo the emergency room. Patient has not done anything for her symptoms. Symptoms are not better or worsens onset. Patient had a right ureteral stent placed on 02/26/2015. She states she's had no palpitations procedure. She sees Dr. Florene Glen is her nephrologist as outpatient.  Interim history Patient felt to be uremic for 2 months and requires HD.  Transferred from [facility] to [facility] and HD started on 10/21.  Assessment & Plan   Acute on chronic kidney disease, Stage IV-V with uremia -Upon admission, Cr 8.02. Trending downward to 3.49, HD started 10/21 -Nephrology consulted and appreciated -IR placed HD cath -HD again today -Vascular consulted for access  Pyelonephritis/UTI -CT abdomen and pelvis noted perinephric stranding -Patient has had some back pain currently afebrile, WBCs normalized. -urine culture showed multiple species present -Continue Rocephin -Blood cultures show no growth to date   Nephroureterolithiasis/right hydronephrosis -Patient did have stent placed by Dr. Gaynelle Arabian approximately one month ago -Per documentation, "not enough hydro to warrant  changing stent or having having a perc."  Nausea -Likely secondary to uremia- has improved -Continue antiemetics as needed   Anemia of chronic disease -Hemoglobin currently 7.4, continue to monitor CBC  -dose of feraheme given  Hypertension -Stable, metoprolol and amlodipine held   Headache (frontal) -Continue pain control -Will order saline nasal spray  Code Status: Full  Family Communication: Daughter at bedside  Disposition Plan: Admitted. Pending HD today  Time Spent in minutes   30 minutes  Procedures  Renal Scan IR placement of RIJ HD cath  Consults   Nephrology Interventional radiology Vascular surgery  DVT Prophylaxis  heparin  Lab Results  Component Value Date   PLT 321 04/11/2015    Medications  Scheduled Meds: . cefTRIAXone (ROCEPHIN)  IV  1 g Intravenous Q24H  . feeding supplement  1 Container Oral TID BM  . ferumoxytol  510 mg Intravenous Once  . heparin  5,000 Units Subcutaneous 3 times per day  . latanoprost  1 drop Both Eyes QHS  . saccharomyces boulardii  250 mg Oral BID  . timolol  1 drop Both Eyes q morning - 10a   Continuous Infusions: . dextrose 5 % 1,000 mL with sodium bicarbonate 150 mEq infusion 50 mL/hr at 04/09/15 1615  PRN Meds:.sodium chloride, sodium chloride, acetaminophen **OR** acetaminophen, acetaminophen **OR** acetaminophen, alteplase, butalbital-acetaminophen-caffeine, calcium carbonate (dosed in mg elemental calcium), camphor-menthol **AND** hydrOXYzine, docusate sodium, feeding supplement (NEPRO CARB STEADY), heparin, heparin, lidocaine (PF), lidocaine-prilocaine, ondansetron **OR** ondansetron (ZOFRAN) IV, ondansetron **OR** ondansetron (ZOFRAN) IV, oxyCODONE, pentafluoroprop-tetrafluoroeth, prochlorperazine, sodium chloride, sorbitol, zolpidem  Antibiotics    Anti-infectives    Start     Dose/Rate Route Frequency Ordered Stop   04/10/15 0943  ceFAZolin (ANCEF) 2-3 GM-% IVPB SOLR    Comments:  Soyla Dryer   :  cabinet override      04/10/15 0943 04/10/15 2159   04/07/15 2200  cefTRIAXone (ROCEPHIN) 1 g in dextrose 5 % 50 mL IVPB     1 g 100 mL/hr over 30 Minutes Intravenous Every 24 hours 04/07/15 0042     04/06/15 2230  cefTRIAXone (ROCEPHIN) 1 g in dextrose 5 % 50 mL IVPB     1 g 100 mL/hr over 30 Minutes Intravenous  Once 04/06/15 2221 04/06/15 2312        Subjective:   [name] seen and examined today.  Patient continues to complain of headache.  Feels nausea has improved and is able to eat.   Denies vomiting, abdominal pain.  Patient denies dizziness, chest pain, shortness of breath.  Objective:   Filed Vitals:   04/10/15 2200 04/11/15 0156 04/11/15 0518 04/11/15 0823  BP: 111/56 101/56 106/49 119/69  Pulse: 96 98 90 94  Temp: 97.9 F (36.6 C) 98.5 F (36.9 C) 98.3 F (36.8 C) 98.3 F (36.8 C)  TempSrc: Oral Oral Oral Axillary  Resp: 18 16 16 16   Height:      Weight: 82.3 kg (181 lb 7 oz)     SpO2: 96% 94% 96% 95%    Wt Readings from Last 3 Encounters:  04/10/15 82.3 kg (181 lb 7 oz)  03/01/15 90.357 kg (199 lb 3.2 oz)  11/24/14 100.2 kg (220 lb 14.4 oz)     Intake/Output Summary (Last 24 hours) at 04/11/15 1122 Last data filed at 04/11/15 [id number]  Gross per 24 hour  Intake   1370 ml  Output    150 ml  Net   1220 ml    Exam  General: Well developed, well nourished, NAD, appears stated age  6: NCAT,  mucous membranes moist.   Cardiovascular: S1 S2 auscultated, no rubs, murmurs or gallops  Respiratory: Clear to auscultation bilaterally   Abdomen: Soft, nontender, nondistended, + bowel sounds  Extremities: warm dry without cyanosis clubbing or edema  Neuro: AAOx3, nonfocal  Psych: Normal affect and demeanor, pleasant  Data Review   Micro Results Recent Results (from the past 240 hour(s))  Urine culture     Status: None   Collection Time: 04/06/15  9:46 PM  Result Value Ref Range Status   Specimen Description URINE, CLEAN CATCH  Final    Special Requests NONE  Final   Culture   Final    MULTIPLE SPECIES PRESENT, SUGGEST RECOLLECTION Performed at [facility]    Report Status 04/08/2015 FINAL  Final  Culture, blood (routine x 2)     Status: None (Preliminary result)   Collection Time: 04/07/15  1:55 AM  Result Value Ref Range Status   Specimen Description BLOOD LEFT ARM  Final   Special Requests BOTTLES DRAWN AEROBIC AND ANAEROBIC 10CC  Final   Culture   Final    NO GROWTH 4 DAYS Performed at [facility]    Report Status PENDING  Incomplete  Culture, blood (routine x 2)     Status: None (Preliminary result)   Collection Time: 04/07/15  2:00 AM  Result Value Ref Range Status   Specimen Description BLOOD LEFT HAND  Final   Special Requests BOTTLES DRAWN AEROBIC AND ANAEROBIC 10CC  Final   Culture   Final    NO GROWTH 4 DAYS Performed at [facility]    Report Status PENDING  Incomplete    Radiology Reports Nm Renal Imaging Flow W/pharm  04/08/2015  CLINICAL DATA:  Acute kidney disease. History of left renal atrophy and chronic right hydronephrosis. EXAM: NUCLEAR MEDICINE RENAL SCAN WITH DIURETIC ADMINISTRATION TECHNIQUE: Radionuclide angiographic and sequential renal images were obtained after intravenous injection of radiopharmaceutical. Imaging was continued during slow intravenous injection of Lasix approximately 15 minutes after the start of the examination. RADIOPHARMACEUTICALS:  13.9 mCi Technetium-29m MAG3 IV COMPARISON:  None. FINDINGS: Flow: Decreased perfusion to the smaller left kidney. Normal perfusion to the right kidney. Left renogram: Diminished cortical uptake by the small left kidney. There is delayed excretion of the radiopharmaceutical. Right renogram: Normal cortical uptake. There is diminished clearance of the radiopharmaceutical from the dilated right renal collecting system. Differential: Left kidney = 28 % Right kidney = 72 % T1/2 post Lasix : Left kidney = not achieved min  Right kidney = not achieved. Min IMPRESSION: 1. Diminished left renal function. There is delayed perfusion, cortical uptake and excretion of the radiopharmaceutical by the small left kidney. 2. Delayed and diminished clearance of the radiopharmaceutical from the dilated right renal collecting system compatible with partial obstruction. 3. Split renal function is equal to 72% from the right kidney and 28% from the left kidney. Electronically Signed   By: Kerby Moors M.D.   On: 04/08/2015 16:34   Ir Fluoro Guide Cv Line Right  04/10/2015  CLINICAL DATA:  End-stage renal disease, hypertension, no current access to begin dialysis. EXAM: ULTRASOUND GUIDANCE FOR VASCULAR ACCESS RIGHT INTERNAL JUGULAR PERMANENT HEMODIALYSIS CATHETER Date:  10/21/201610/21/2016 10:12 am Radiologist:  M. Daryll Brod, MD Guidance:  Ultrasound and fluoroscopic FLUOROSCOPY TIME:  30 seconds, 10.7 mGy MEDICATIONS AND MEDICAL HISTORY: 2 g Ancef administered within 1 hour of the procedure, 1 mg Versed, 50 mcg fentanyl ANESTHESIA/SEDATION: 15 minutes CONTRAST:  None. COMPLICATIONS: None immediate PROCEDURE: Informed consent was obtained from the patient following explanation of the procedure, risks, benefits and alternatives. The patient understands, agrees and consents for the procedure. All questions were addressed. A time out was performed. Maximal barrier sterile technique utilized including caps, mask, sterile gowns, sterile gloves, large sterile drape, hand hygiene, and 2% chlorhexidine scrub. Under sterile conditions and local anesthesia, right internal jugular micropuncture venous access was performed with ultrasound. Images were obtained for documentation. A guide wire was inserted followed by a transitional dilator. Next, a 0.035 guidewire was advanced into the IVC with a 5-French catheter. Measurements were obtained from the right venotomy site to the proximal right atrium. In the right infraclavicular chest, a subcutaneous  tunnel was created under sterile conditions and local anesthesia. 1% lidocaine with epinephrine was utilized for this. The 19 cm tip to cuff palindrome catheter was tunneled subcutaneously to the venotomy site and inserted into the SVC/RA junction through a valved peel-away sheath. Position was confirmed with fluoroscopy. Images were obtained for documentation. Blood was aspirated from the catheter followed by saline and heparin flushes. The appropriate volume and strength of heparin was instilled in each lumen. Caps were applied. The catheter was secured at  the tunnel site with Gelfoam and a pursestring suture. The venotomy site was closed with subcuticular Vicryl suture. Dermabond was applied to the small right neck incision. A dry sterile dressing was applied. The catheter is ready for use. No immediate complications. IMPRESSION: Ultrasound and fluoroscopically guided right internal jugular tunneled hemodialysis catheter (Palindrome catheter). Electronically Signed   By: Jerilynn Mages.  Shick M.D.   On: 04/10/2015 11:03   Ir US Guide Vasc Access Right  04/10/2015  CLINICAL DATA:  End-stage renal disease, hypertension, no current access to begin dialysis. EXAM: ULTRASOUND GUIDANCE FOR VASCULAR ACCESS RIGHT INTERNAL JUGULAR PERMANENT HEMODIALYSIS CATHETER Date:  10/21/201610/21/2016 10:12 am Radiologist:  M. Daryll Brod, MD Guidance:  Ultrasound and fluoroscopic FLUOROSCOPY TIME:  30 seconds, 10.7 mGy MEDICATIONS AND MEDICAL HISTORY: 2 g Ancef administered within 1 hour of the procedure, 1 mg Versed, 50 mcg fentanyl ANESTHESIA/SEDATION: 15 minutes CONTRAST:  None. COMPLICATIONS: None immediate PROCEDURE: Informed consent was obtained from the patient following explanation of the procedure, risks, benefits and alternatives. The patient understands, agrees and consents for the procedure. All questions were addressed. A time out was performed. Maximal barrier sterile technique utilized including caps, mask, sterile gowns,  sterile gloves, large sterile drape, hand hygiene, and 2% chlorhexidine scrub. Under sterile conditions and local anesthesia, right internal jugular micropuncture venous access was performed with ultrasound. Images were obtained for documentation. A guide wire was inserted followed by a transitional dilator. Next, a 0.035 guidewire was advanced into the IVC with a 5-French catheter. Measurements were obtained from the right venotomy site to the proximal right atrium. In the right infraclavicular chest, a subcutaneous tunnel was created under sterile conditions and local anesthesia. 1% lidocaine with epinephrine was utilized for this. The 19 cm tip to cuff palindrome catheter was tunneled subcutaneously to the venotomy site and inserted into the SVC/RA junction through a valved peel-away sheath. Position was confirmed with fluoroscopy. Images were obtained for documentation. Blood was aspirated from the catheter followed by saline and heparin flushes. The appropriate volume and strength of heparin was instilled in each lumen. Caps were applied. The catheter was secured at the tunnel site with Gelfoam and a pursestring suture. The venotomy site was closed with subcuticular Vicryl suture. Dermabond was applied to the small right neck incision. A dry sterile dressing was applied. The catheter is ready for use. No immediate complications. IMPRESSION: Ultrasound and fluoroscopically guided right internal jugular tunneled hemodialysis catheter (Palindrome catheter). Electronically Signed   By: Jerilynn Mages.  Shick M.D.   On: 04/10/2015 11:03   Ct Renal Stone Study  04/06/2015  CLINICAL DATA:  Acute onset of renal failure. Nausea. Recent placement of right ureteral stent for obstructing ureteral stone. Initial encounter. EXAM: CT ABDOMEN AND PELVIS WITHOUT CONTRAST TECHNIQUE: Multidetector CT imaging of the abdomen and pelvis was performed following the standard protocol without IV contrast. COMPARISON:  CT of the abdomen and  pelvis performed 02/20/2015, and renal ultrasound performed 02/27/2015 FINDINGS: The visualized lung bases are clear. A large hiatal hernia is noted. Mild calcification is noted at the mitral valve. The liver and spleen are unremarkable in appearance. The patient is status post cholecystectomy, with clips noted at the gallbladder fossa. The pancreas and adrenal glands are unremarkable. There is mild right-sided and minimal left-sided hydronephrosis, with diffuse wall thickening at the renal calyces and pelves, extending along both ureters, and mild perinephric stranding. Findings are concerning for pyelonephritis and ureteritis. A right ureteral stent is noted in expected position. No obstructing ureteral stones are seen.  There is wall thickening about the bladder, with associated soft tissue inflammation, reflecting underlying cystitis. No free fluid is identified. The small bowel is unremarkable in appearance. The stomach is within normal limits. No acute vascular abnormalities are seen. The appendix is normal in caliber, without evidence of appendicitis. Mild scattered diverticulosis is noted along the transverse, descending and proximal sigmoid colon, without evidence of diverticulitis. There is a relatively broad-based moderate anterior abdominal wall hernia extending into the pannus, containing only fat. A small periumbilical hernia is also noted, to the right of the umbilicus, containing only fat. The patient is status post hysterectomy. No suspicious adnexal masses are seen. No inguinal lymphadenopathy is seen. No acute osseous abnormalities are identified. There is grade 1 retrolisthesis of L2 on L3, with associated vacuum phenomenon and endplate sclerotic change, and minimal grade 1 retrolisthesis of L3 on L4. IMPRESSION: 1. Mild right-sided and minimal left-sided hydronephrosis, with diffuse wall thickening at the renal calyces and pelves, extending along both ureters, and mild perinephric stranding.  Findings concerning for bilateral pyelonephritis and ureteritis, somewhat more prominent than in September. Right ureteral stent noted in expected position. No obstructing ureteral stone seen. 2. Diffuse bladder wall thickening, with associated soft tissue inflammation, concerning for underlying cystitis. 3. Large hiatal hernia noted. 4. Mild scattered diverticulosis along the transverse, descending and proximal sigmoid colon, without evidence of diverticulitis. 5. Relatively broad-based moderate anterior abdominal wall hernia extending into the pannus, containing only fat. Small periumbilical hernia to the right of the umbilicus, also containing only fat. 6. Mild degenerative change at the mid lumbar spine. Electronically Signed   By: Garald Balding M.D.   On: 04/06/2015 22:21    CBC  Recent Labs Lab 04/06/15 2108 04/07/15 0200 04/08/15 0545 04/08/15 0915 04/09/15 0525 04/11/15 0343  WBC 11.2* 11.2*  --  6.9 10.6* 7.3  HGB 9.1* 7.6*  --  7.3* 8.1* 7.4*  HCT 29.5* 24.8* 23.6* 23.5* 26.2* 24.2*  PLT 493* 418*  --  350 392 321  MCV 87.3 86.1  --  86.7 85.9 88.3  MCH 26.9 26.4  --  26.9 26.6 27.0  MCHC 30.8 30.6  --  31.1 30.9 30.6  RDW 16.6* 16.4*  --  16.9* 16.7* 16.8*  LYMPHSABS 1.6  --   --  1.0  --   --   MONOABS 0.6  --   --  0.4  --   --   EOSABS 0.3  --   --  0.3  --   --   BASOSABS 0.1  --   --  0.0  --   --     Chemistries   Recent Labs Lab 04/06/15 2108 04/07/15 0200 04/08/15 0545 04/09/15 0525 04/10/15 1935 04/11/15 0343  NA 135 136 138 140 138 135  K 4.3 4.0 3.7 3.2* 3.2* 3.6  CL 106 109 108 98* 90* 93*  CO2 20* 17* 20* 29 35* 34*  GLUCOSE 119* 100* 111* 108* 106* 113*  BUN 86* 85* 73* 62* 49* 18  CREATININE 8.02* 7.35* 6.48* 5.76* 5.79* 3.49*  CALCIUM 9.6 9.1 8.6* 8.7* 8.5* 7.8*  AST 17  --   --   --   --   --   ALT 15  --   --   --   --   --   ALKPHOS 76  --   --   --   --   --   BILITOT 0.5  --   --   --   --   --     ------------------------------------------------------------------------------------------------------------------  estimated creatinine clearance is 15.2 mL/min (by C-G formula based on Cr of 3.49). ------------------------------------------------------------------------------------------------------------------ No results for input(s): HGBA1C in the last 72 hours. ------------------------------------------------------------------------------------------------------------------ No results for input(s): CHOL, HDL, LDLCALC, TRIG, CHOLHDL, LDLDIRECT in the last 72 hours. ------------------------------------------------------------------------------------------------------------------ No results for input(s): TSH, T4TOTAL, T3FREE, THYROIDAB in the last 72 hours.  Invalid input(s): FREET3 ------------------------------------------------------------------------------------------------------------------ No results for input(s): VITAMINB12, FOLATE, FERRITIN, TIBC, IRON, RETICCTPCT in the last 72 hours.  Coagulation profile No results for input(s): INR, PROTIME in the last 168 hours.  No results for input(s): DDIMER in the last 72 hours.  Cardiac Enzymes No results for input(s): CKMB, TROPONINI, MYOGLOBIN in the last 168 hours.  Invalid input(s): CK ------------------------------------------------------------------------------------------------------------------ Invalid input(s): POCBNP    Cache Bills D.O. on 04/11/2015 at 11:22 AM  Between 7am to 7pm - Pager - [phone]  After 7pm go to [email or web address] - password [email or web address]  And look for the night coverage person covering for me after hours  [facility] Office  [phone]

## 2015-04-12 ENCOUNTER — Inpatient Hospital Stay (HOSPITAL_COMMUNITY): Payer: Medicare Other

## 2015-04-12 DIAGNOSIS — E876 Hypokalemia: Secondary | ICD-10-CM

## 2015-04-12 DIAGNOSIS — N189 Chronic kidney disease, unspecified: Secondary | ICD-10-CM

## 2015-04-12 LAB — RENAL FUNCTION PANEL
Albumin: 2 g/dL — ABNORMAL LOW (ref 3.5–5.0)
Anion gap: 8 (ref 5–15)
BUN: 22 mg/dL — ABNORMAL HIGH (ref 6–20)
CO2: 34 mmol/L — ABNORMAL HIGH (ref 22–32)
Calcium: 7.8 mg/dL — ABNORMAL LOW (ref 8.9–10.3)
Chloride: 91 mmol/L — ABNORMAL LOW (ref 101–111)
Creatinine, Ser: 4.26 mg/dL — ABNORMAL HIGH (ref 0.44–1.00)
GFR calc Af Amer: 11 mL/min — ABNORMAL LOW (ref >60)
GFR calc non Af Amer: 10 mL/min — ABNORMAL LOW (ref >60)
Glucose, Bld: 108 mg/dL — ABNORMAL HIGH (ref 65–99)
Phosphorus: 2.7 mg/dL (ref 2.5–4.6)
Potassium: 4.4 mmol/L (ref 3.5–5.1)
Sodium: 133 mmol/L — ABNORMAL LOW (ref 135–145)

## 2015-04-12 LAB — CBC
HCT: 23.7 % — ABNORMAL LOW (ref 36.0–46.0)
Hemoglobin: 7.1 g/dL — ABNORMAL LOW (ref 12.0–15.0)
MCH: 26.6 pg (ref 26.0–34.0)
MCHC: 30 g/dL (ref 30.0–36.0)
MCV: 88.8 fL (ref 78.0–100.0)
Platelets: 294 10*3/uL (ref 150–400)
RBC: 2.67 MIL/uL — ABNORMAL LOW (ref 3.87–5.11)
RDW: 16.6 % — ABNORMAL HIGH (ref 11.5–15.5)
WBC: 7.2 10*3/uL (ref 4.0–10.5)

## 2015-04-12 LAB — CULTURE, BLOOD (ROUTINE X 2)
Culture: NO GROWTH
Culture: NO GROWTH

## 2015-04-12 MED ORDER — GI COCKTAIL ~~LOC~~
30.0000 mL | Freq: Once | ORAL | Status: AC
  Administered 2015-04-12: 30 mL via ORAL
  Filled 2015-04-12: qty 30

## 2015-04-12 MED ORDER — FAMOTIDINE 20 MG PO TABS: 20.0000 mg | ORAL_TABLET | Freq: Two times a day (BID) | ORAL | Status: DC

## 2015-04-12 MED ORDER — FAMOTIDINE 20 MG PO TABS
10.0000 mg | ORAL_TABLET | Freq: Two times a day (BID) | ORAL | Status: DC
  Administered 2015-04-12 – 2015-04-13 (×4): 10 mg via ORAL
  Filled 2015-04-12 (×4): qty 1

## 2015-04-12 NOTE — Progress Notes (Signed)
Subjective: The patient reports making urine from both nephrostomy tube and per urethra ( JJ stent in place)  Objective: Vital signs in last 24 hours: Temp:  [98.7 F (37.1 C)-99.5 F (37.5 C)] 99.5 F (37.5 C) (10/23 0848) Pulse Rate:  [80-110] 110 (10/23 0848) Resp:  [12-16] 15 (10/23 0848) BP: (109-123)/(50-60) 113/54 mmHg (10/23 0848) SpO2:  [91 %-95 %] 91 % (10/23 0848) Weight:  [83.87 kg (184 lb 14.4 oz)] 83.87 kg (184 lb 14.4 oz) (10/22 2025)A  Intake/Output from previous day: 10/22 0701 - 10/23 0700 In: 1500 [P.O.:900; I.V.:600] Out: 1175 [Urine:1175] Intake/Output this shift: Total I/O In: 240 [P.O.:240] Out: 450 [Urine:450]  Past Medical History  Diagnosis Date  . Hypertension   . GERD (gastroesophageal reflux disease)   . Glaucoma   . H/O renal calculi 2002 & 2006  . H/O hiatal hernia   . Headache(784.0)     migraine  . Pneumonia     dx 10-06-2014 per CXR--  on 10-27-2014 pt states finished antibiotic and denies cough or fever  . Hyperlipidemia   . History of MI (myocardial infarction)     10/ 2013 in setting of Septic Shock  . History of atrial fibrillation     10/ 2013  in setting of Septic Shock  . History of CHF (congestive heart failure)     10/ 2013 in setting of septic shock  . Sigmoid diverticulosis   . Nephrolithiasis     bilateral  . CKD (chronic kidney disease), stage III     nephrologist-  dr Florene Glen  . Right ureteral stone   . History of acute respiratory failure     10/ 2013  -- ventilated in setting septic shock  . Dysrhythmia     Afib in 2013 when she had septic shock related to stone ureteral obstruction  . Myocardial infarction [facility])     Was reported in 2013 during hospitalization with septic shock  . Arthritis     knee's  . Foley catheter in place   . Septic shock ([place]) 04/04/2012  . Sepsis [facility])     Physical Exam:  Lungs - Normal respiratory effort, chest expands symmetrically.  Abdomen - Soft, non-tender &  non-distended.  Lab Results:  Recent Labs  04/11/15 0343 04/11/15 1450 04/12/15 0327  WBC 7.3 6.9 7.2  HGB 7.4* 8.1* 7.1*  HCT 24.2* 27.4* 23.7*   BMET  Recent Labs  04/11/15 1450 04/12/15 0327  NA 134* 133*  K 3.3* 4.4  CL 89* 91*  CO2 35* 34*  GLUCOSE 112* 108*  BUN 19 22*  CREATININE 4.09* 4.26*  CALCIUM 8.3* 7.8*   No results for input(s): LABURIN in the last 72 hours. Results for orders placed or performed during the hospital encounter of 04/06/15  Urine culture     Status: None   Collection Time: 04/06/15  9:46 PM  Result Value Ref Range Status   Specimen Description URINE, CLEAN CATCH  Final   Special Requests NONE  Final   Culture   Final    MULTIPLE SPECIES PRESENT, SUGGEST RECOLLECTION Performed at [facility]    Report Status 04/08/2015 FINAL  Final  Culture, blood (routine x 2)     Status: None   Collection Time: 04/07/15  1:55 AM  Result Value Ref Range Status   Specimen Description BLOOD LEFT ARM  Final   Special Requests BOTTLES DRAWN AEROBIC AND ANAEROBIC 10CC  Final   Culture   Final    NO GROWTH  5 DAYS Performed at [facility]    Report Status 04/12/2015 FINAL  Final  Culture, blood (routine x 2)     Status: None   Collection Time: 04/07/15  2:00 AM  Result Value Ref Range Status   Specimen Description BLOOD LEFT HAND  Final   Special Requests BOTTLES DRAWN AEROBIC AND ANAEROBIC 10CC  Final   Culture   Final    NO GROWTH 5 DAYS Performed at [facility]    Report Status 04/12/2015 FINAL  Final    Studies/Results: Ir Nephrostomy Placement Right  04/11/2015  CLINICAL DATA:  PERSISTENT RIGHT HYDRONEPHROSIS DESPITE URETERAL STENT INSERTION. RENAL FAILURE, ELEVATED CREATININE. EXAM: ULTRASOUND FLUOROSCOPIC 10 FRENCH RIGHT NEPHROSTOMY INSERTION Date:  10/22/201610/22/2016 2:23 pm Radiologist:  M. Daryll Brod, MD Guidance:  Ultrasound and fluoroscopic FLUOROSCOPY TIME:  1 minutes 12 seconds, 60 mGy MEDICATIONS AND  MEDICAL HISTORY: Patient is already receiving 1 g Rocephin every 24 hours. 1.5 mg Versed, 75 mcg fentanyl ANESTHESIA/SEDATION: 10 minutes CONTRAST:  50mL OMNIPAQUE IOHEXOL 300 MG/ML  SOLN COMPLICATIONS: None immediate PROCEDURE: Informed consent was obtained from the patient following explanation of the procedure, risks, benefits and alternatives. The patient understands, agrees and consents for the procedure. All questions were addressed. A time out was performed. Maximal barrier sterile technique utilized including caps, mask, sterile gowns, sterile gloves, large sterile drape, hand hygiene, and ChloraPrep. Under sterile conditions and local anesthesia, ultrasound micropuncture needle access performed of a right mid to lower pole dilated calyx. Needle position confirmed with ultrasound. Images obtained for documentation. There was return of cloudy exudative urine. Guidewire advanced easily into the renal pelvis. Accustick dilator set advanced. Amplatz guidewire exchange performed. Tract dilatation performed to advance a 10 French nephrostomy with the retention loop formed in the renal pelvis. Contrast injection confirms position. Gravity drainage bag connected. Catheter secured with a Prolene suture and a sterile dressing. No immediate complication. Patient tolerated the procedure well. IMPRESSION: Successful ultrasound and fluoroscopic 10 French right nephrostomy insertion. Electronically Signed   By: Jerilynn Mages.  Shick M.D.   On: 04/11/2015 14:33    Assessment:  Creatinine unchanged so far post perc nephrostomy, but with urine from both tubes.  Plan: Follow. Nephrology proceeding with plans for mapping for possible dialysis in the future.  Nelissa Bolduc I Fortune Torosian 04/12/2015, 2:11 PM

## 2015-04-12 NOTE — Progress Notes (Signed)
Assessment: 1 CKD stage 4 with AKI and uremia, s/p hemodialysis on 10/21. Creat 1.82 10/27/14. Followed by Dr. Florene Glen at [facility]. 2 Recurrent obstruction recently with Nephroureterolithiasis/ moderate R hydro - s/p R JJ stent sept '16 Radionucleotide study show partial ureteral obstruction on right, mild left hydro.  S/p R PCN yesterday(appreciate help of Urology and Interventional Radiology) 4 HTN no BP meds here.  5 AFib 6 Hx MI 7 Pyuria - recurrent problem, on Rocephin, multiple colonies on culture 8 Anemia worse today post PCN procedure & Iron defic s/p IV iron  Plan - Monitor renal function.  No indication for dialysis.  Hopeful for improvement with further drainage.  Subjective: Interval History: Nephrostomy tube draining  Objective: Vital signs in last 24 hours: Temp:  [98.7 F (37.1 C)-99.5 F (37.5 C)] 99.5 F (37.5 C) (10/23 0848) Pulse Rate:  [80-110] 110 (10/23 0848) Resp:  [12-16] 15 (10/23 0848) BP: (109-137)/(49-60) 113/54 mmHg (10/23 0848) SpO2:  [91 %-95 %] 91 % (10/23 0848) Weight:  [83.87 kg (184 lb 14.4 oz)] 83.87 kg (184 lb 14.4 oz) (10/22 2025) Weight change: 1.47 kg (3 lb 3.9 oz)  Intake/Output from previous day: 10/22 0701 - 10/23 0700 In: 1500 [P.O.:900; I.V.:600] Out: 1175 [Urine:1175] Intake/Output this shift: Total I/O In: 120 [P.O.:120] Out: -   Head: Normocephalic, without obvious abnormality, atraumatic Resp: clear to auscultation bilaterally Cardio: regular rate and rhythm, S1, S2 normal, no murmur, click, rub or gallop Extremities: edema tr to 1 PCN on right  Lab Results:  Recent Labs  04/11/15 1450 04/12/15 0327  WBC 6.9 7.2  HGB 8.1* 7.1*  HCT 27.4* 23.7*  PLT 367 294   BMET:  Recent Labs  04/11/15 1450 04/12/15 0327  NA 134* 133*  K 3.3* 4.4  CL 89* 91*  CO2 35* 34*  GLUCOSE 112* 108*  BUN 19 22*  CREATININE 4.09* 4.26*  CALCIUM 8.3* 7.8*   No results for input(s): PTH in the last 72 hours. Iron Studies: No results for  input(s): IRON, TIBC, TRANSFERRIN, FERRITIN in the last 72 hours. Studies/Results: US Renal  04/11/2015  CLINICAL DATA:  Hydronephrosis, poorly functioning right ureteral stent, currently on dialysis, elevated creatinine EXAM: RENAL / URINARY TRACT ULTRASOUND COMPLETE COMPARISON:  04/06/2015 CT FINDINGS: Right Kidney: Length: 11.5 cm. Normal echogenicity and cortical thickness. Ureteral stent extends into the upper pole by ultrasound. Moderate hydronephrosis evident. No focal abnormality. Left Kidney: Length: 9.5 cm. Mild cortical thinning but normal echogenicity. Mild left hydronephrosis and proximal hydroureter. Bladder: Appears normal for degree of bladder distention. IMPRESSION: Right greater than left hydronephrosis despite right ureteral stent insertion Electronically Signed   By: Jerilynn Mages.  Shick M.D.   On: 04/11/2015 13:53   Ir Fluoro Guide Cv Line Right  04/10/2015  CLINICAL DATA:  End-stage renal disease, hypertension, no current access to begin dialysis. EXAM: ULTRASOUND GUIDANCE FOR VASCULAR ACCESS RIGHT INTERNAL JUGULAR PERMANENT HEMODIALYSIS CATHETER Date:  10/21/201610/21/2016 10:12 am Radiologist:  M. Daryll Brod, MD Guidance:  Ultrasound and fluoroscopic FLUOROSCOPY TIME:  30 seconds, 10.7 mGy MEDICATIONS AND MEDICAL HISTORY: 2 g Ancef administered within 1 hour of the procedure, 1 mg Versed, 50 mcg fentanyl ANESTHESIA/SEDATION: 15 minutes CONTRAST:  None. COMPLICATIONS: None immediate PROCEDURE: Informed consent was obtained from the patient following explanation of the procedure, risks, benefits and alternatives. The patient understands, agrees and consents for the procedure. All questions were addressed. A time out was performed. Maximal barrier sterile technique utilized including caps, mask, sterile gowns, sterile gloves, large sterile  drape, hand hygiene, and 2% chlorhexidine scrub. Under sterile conditions and local anesthesia, right internal jugular micropuncture venous access was  performed with ultrasound. Images were obtained for documentation. A guide wire was inserted followed by a transitional dilator. Next, a 0.035 guidewire was advanced into the IVC with a 5-French catheter. Measurements were obtained from the right venotomy site to the proximal right atrium. In the right infraclavicular chest, a subcutaneous tunnel was created under sterile conditions and local anesthesia. 1% lidocaine with epinephrine was utilized for this. The 19 cm tip to cuff palindrome catheter was tunneled subcutaneously to the venotomy site and inserted into the SVC/RA junction through a valved peel-away sheath. Position was confirmed with fluoroscopy. Images were obtained for documentation. Blood was aspirated from the catheter followed by saline and heparin flushes. The appropriate volume and strength of heparin was instilled in each lumen. Caps were applied. The catheter was secured at the tunnel site with Gelfoam and a pursestring suture. The venotomy site was closed with subcuticular Vicryl suture. Dermabond was applied to the small right neck incision. A dry sterile dressing was applied. The catheter is ready for use. No immediate complications. IMPRESSION: Ultrasound and fluoroscopically guided right internal jugular tunneled hemodialysis catheter (Palindrome catheter). Electronically Signed   By: Jerilynn Mages.  Shick M.D.   On: 04/10/2015 11:03   Ir US Guide Vasc Access Right  04/10/2015  CLINICAL DATA:  End-stage renal disease, hypertension, no current access to begin dialysis. EXAM: ULTRASOUND GUIDANCE FOR VASCULAR ACCESS RIGHT INTERNAL JUGULAR PERMANENT HEMODIALYSIS CATHETER Date:  10/21/201610/21/2016 10:12 am Radiologist:  M. Daryll Brod, MD Guidance:  Ultrasound and fluoroscopic FLUOROSCOPY TIME:  30 seconds, 10.7 mGy MEDICATIONS AND MEDICAL HISTORY: 2 g Ancef administered within 1 hour of the procedure, 1 mg Versed, 50 mcg fentanyl ANESTHESIA/SEDATION: 15 minutes CONTRAST:  None. COMPLICATIONS: None  immediate PROCEDURE: Informed consent was obtained from the patient following explanation of the procedure, risks, benefits and alternatives. The patient understands, agrees and consents for the procedure. All questions were addressed. A time out was performed. Maximal barrier sterile technique utilized including caps, mask, sterile gowns, sterile gloves, large sterile drape, hand hygiene, and 2% chlorhexidine scrub. Under sterile conditions and local anesthesia, right internal jugular micropuncture venous access was performed with ultrasound. Images were obtained for documentation. A guide wire was inserted followed by a transitional dilator. Next, a 0.035 guidewire was advanced into the IVC with a 5-French catheter. Measurements were obtained from the right venotomy site to the proximal right atrium. In the right infraclavicular chest, a subcutaneous tunnel was created under sterile conditions and local anesthesia. 1% lidocaine with epinephrine was utilized for this. The 19 cm tip to cuff palindrome catheter was tunneled subcutaneously to the venotomy site and inserted into the SVC/RA junction through a valved peel-away sheath. Position was confirmed with fluoroscopy. Images were obtained for documentation. Blood was aspirated from the catheter followed by saline and heparin flushes. The appropriate volume and strength of heparin was instilled in each lumen. Caps were applied. The catheter was secured at the tunnel site with Gelfoam and a pursestring suture. The venotomy site was closed with subcuticular Vicryl suture. Dermabond was applied to the small right neck incision. A dry sterile dressing was applied. The catheter is ready for use. No immediate complications. IMPRESSION: Ultrasound and fluoroscopically guided right internal jugular tunneled hemodialysis catheter (Palindrome catheter). Electronically Signed   By: Jerilynn Mages.  Shick M.D.   On: 04/10/2015 11:03   Ir Nephrostomy Placement Right  04/11/2015   CLINICAL  DATA:  PERSISTENT RIGHT HYDRONEPHROSIS DESPITE URETERAL STENT INSERTION. RENAL FAILURE, ELEVATED CREATININE. EXAM: ULTRASOUND FLUOROSCOPIC 10 FRENCH RIGHT NEPHROSTOMY INSERTION Date:  10/22/201610/22/2016 2:23 pm Radiologist:  M. Daryll Brod, MD Guidance:  Ultrasound and fluoroscopic FLUOROSCOPY TIME:  1 minutes 12 seconds, 60 mGy MEDICATIONS AND MEDICAL HISTORY: Patient is already receiving 1 g Rocephin every 24 hours. 1.5 mg Versed, 75 mcg fentanyl ANESTHESIA/SEDATION: 10 minutes CONTRAST:  90mL OMNIPAQUE IOHEXOL 300 MG/ML  SOLN COMPLICATIONS: None immediate PROCEDURE: Informed consent was obtained from the patient following explanation of the procedure, risks, benefits and alternatives. The patient understands, agrees and consents for the procedure. All questions were addressed. A time out was performed. Maximal barrier sterile technique utilized including caps, mask, sterile gowns, sterile gloves, large sterile drape, hand hygiene, and ChloraPrep. Under sterile conditions and local anesthesia, ultrasound micropuncture needle access performed of a right mid to lower pole dilated calyx. Needle position confirmed with ultrasound. Images obtained for documentation. There was return of cloudy exudative urine. Guidewire advanced easily into the renal pelvis. Accustick dilator set advanced. Amplatz guidewire exchange performed. Tract dilatation performed to advance a 10 French nephrostomy with the retention loop formed in the renal pelvis. Contrast injection confirms position. Gravity drainage bag connected. Catheter secured with a Prolene suture and a sterile dressing. No immediate complication. Patient tolerated the procedure well. IMPRESSION: Successful ultrasound and fluoroscopic 10 French right nephrostomy insertion. Electronically Signed   By: Jerilynn Mages.  Shick M.D.   On: 04/11/2015 14:33   Scheduled: . cefTRIAXone (ROCEPHIN)  IV  1 g Intravenous Q24H  . famotidine  10 mg Oral BID  . feeding supplement  1  Container Oral TID BM  . heparin  5,000 Units Subcutaneous 3 times per day  . latanoprost  1 drop Both Eyes QHS  . saccharomyces boulardii  250 mg Oral BID  . timolol  1 drop Both Eyes q morning - 10a     LOS: 6 days   Shanara Schnieders C 04/12/2015,9:06 AM

## 2015-04-12 NOTE — Progress Notes (Signed)
Right  Upper Extremity Vein Map    Cephalic  Segment Diameter Depth Comment  1. Axilla 3.79mm mm   2. Mid upper arm 85mm mm   3. Above AC 5.40mm mm   4. In AC 23mm mm   5. Below AC 3.35mm mm branch  6. Mid forearm 3.99mm mm branches  7. Wrist 2.43mm mm    mm mm    mm mm    mm mm    Basilic  Segment Diameter Depth Comment  1. Origin 8.76mm 88mm   2. Mid upper arm mm 61mm   3. Above AC 6.54mm 13mm   4. In [facility] 5.69mm 5.47mm branch  5. Below AC 2.50mm 3.90mm   6. Mid forearm 68mm 4.38mm   7. Wrist 2.44mm mm branch   mm mm    mm mm    mm mm   Left Upper Extremity Vein Map    Cephalic  Segment Diameter Depth Comment  1. Axilla 1.20mm mm   2. Mid upper arm 12mm mm   3. Above AC 2.71mm mm   4. In AC 66mm mm   5. Below AC 3.42mm mm branches  6. Mid forearm 3.28mm mm branch  7. Wrist 61mm mm branch   mm mm    mm mm    mm mm    Basilic  Segment Diameter Depth Comment  1. Origin 49mm 32mm   2. Mid upper arm mm mm   3. Above AC 72mm 69mm branches  4. In [facility] 3.32mm 3.46mm   5. Below AC 2.77mm 3.25mm   6. Mid forearm 42mm 40mm   7. Wrist 1.65mm 2.25mm    mm mm    mm mm    mm mm

## 2015-04-12 NOTE — Progress Notes (Signed)
[facility]                                                                              Patient Demographics  [name], is a 69 y.o. female, DOB - [birth year], XT:[id number]  Admit date - 04/06/2015   Admitting Physician Waldemar Dickens, MD  Outpatient Primary MD for the patient is [name], MD  LOS - 6   No chief complaint on file.     HPI on 04/07/2015 by Dr. Linna Darner [name] is a 69 y.o. female  Patient was seen today by her primary care physician who drew routine labs admitting: The patient to report elevated creatinine and ask her to go to the emergency room for further evaluation. With the exception of one-week history of intermittent nausea, dysuria, and left lower back pain patient states she's been in her normal state of health and had no inkling of any underlying illness or recent echo the emergency room. Patient has not done anything for her symptoms. Symptoms are not better or worsens onset. Patient had a right ureteral stent placed on 02/26/2015. She states she's had no palpitations procedure. She sees Dr. Florene Glen is her nephrologist as outpatient.  Interim history Patient felt to be uremic for 2 months and requires HD.  Transferred from [facility] to [facility] and HD started on 10/21.  Assessment & Plan   Acute on chronic kidney disease, Stage IV-V with uremia -Upon admission, Cr 8.02. Trending downward to 3.49, HD started 10/21 -Nephrology consulted and appreciated -IR placed HD cath and right nephrostomy- set to gravity -Vascular consulted for access  Pyelonephritis/UTI -CT abdomen and pelvis noted perinephric stranding -Patient has had some back pain currently afebrile, WBCs normalized. -urine culture showed multiple species present -Continue Rocephin -Blood cultures show no growth to date   Nephroureterolithiasis/right hydronephrosis -Patient did have stent placed by Dr. Gaynelle Arabian approximately one month ago -Patient had right PCN placed  by IR 10/22  Nausea -Likely secondary to uremia- has improved -Continue antiemetics as needed   Anemia of chronic disease -Hemoglobin currently 7.1, continue to monitor CBC  -dose of feraheme given  Hypertension -Stable, metoprolol and amlodipine held   Headache (frontal) -Continue pain control and saline nasal spray  Hypokalemia -Resolved, continue to monitor BMP  Code Status: Full  Family Communication: Daughter at bedside  Disposition Plan: Admitted. Pending HD today  Time Spent in minutes   30 minutes  Procedures  Renal Scan IR placement of RIJ HD cath IR Insertion of right nephrostomy   Consults   Nephrology Interventional radiology Vascular surgery Urology  DVT Prophylaxis  heparin  Lab Results  Component Value Date   PLT 294 04/12/2015    Medications  Scheduled Meds: . cefTRIAXone (ROCEPHIN)  IV  1 g Intravenous Q24H  . famotidine  10 mg Oral BID  . feeding supplement  1 Container Oral TID BM  . heparin  5,000 Units Subcutaneous 3 times per day  . latanoprost  1 drop Both Eyes QHS  . saccharomyces boulardii  250 mg Oral BID  . timolol  1 drop Both Eyes q morning - 10a   Continuous Infusions: . dextrose 5 % 1,000 mL with  sodium bicarbonate 150 mEq infusion 50 mL/hr at 04/09/15 1615   PRN Meds:.sodium chloride, sodium chloride, acetaminophen **OR** acetaminophen, acetaminophen **OR** acetaminophen, alteplase, butalbital-acetaminophen-caffeine, calcium carbonate (dosed in mg elemental calcium), camphor-menthol **AND** hydrOXYzine, docusate sodium, feeding supplement (NEPRO CARB STEADY), heparin, heparin, iohexol, lidocaine (PF), lidocaine-prilocaine, ondansetron **OR** ondansetron (ZOFRAN) IV, ondansetron **OR** ondansetron (ZOFRAN) IV, oxyCODONE, pentafluoroprop-tetrafluoroeth, prochlorperazine, sodium chloride, sorbitol, zolpidem  Antibiotics    Anti-infectives    Start     Dose/Rate Route Frequency Ordered Stop   04/11/15 1500  cefTRIAXone  (ROCEPHIN) 1 g in dextrose 5 % 50 mL IVPB     1 g 100 mL/hr over 30 Minutes Intravenous Every 24 hours 04/11/15 1417     04/10/15 0943  ceFAZolin (ANCEF) 2-3 GM-% IVPB SOLR    Comments:  Soyla Dryer   : cabinet override      04/10/15 0943 04/10/15 2159   04/07/15 2200  cefTRIAXone (ROCEPHIN) 1 g in dextrose 5 % 50 mL IVPB  Status:  Discontinued     1 g 100 mL/hr over 30 Minutes Intravenous Every 24 hours 04/07/15 0042 04/11/15 1417   04/06/15 2230  cefTRIAXone (ROCEPHIN) 1 g in dextrose 5 % 50 mL IVPB     1 g 100 mL/hr over 30 Minutes Intravenous  Once 04/06/15 2221 04/06/15 2312        Subjective:   [name] seen and examined today.  Patient states she is not feeling well this morning.  Denies further headache or nausea.  States she had a "rough morning."  Denies chest pain, shortness of breath, abdominal pain.  Irritated with the nephrostomy bag.   Objective:   Filed Vitals:   04/11/15 1433 04/11/15 2025 04/12/15 0558 04/12/15 0848  BP: 109/50 123/57 121/54 113/54  Pulse: 80 94 109 110  Temp:  98.7 F (37.1 C) 99.1 F (37.3 C) 99.5 F (37.5 C)  TempSrc:  Oral Oral Oral  Resp: 12 14 14 15   Height:  5\' 3"  (1.6 m)    Weight:  83.87 kg (184 lb 14.4 oz)    SpO2: 95% 93% 92% 91%    Wt Readings from Last 3 Encounters:  04/11/15 83.87 kg (184 lb 14.4 oz)  03/01/15 90.357 kg (199 lb 3.2 oz)  11/24/14 100.2 kg (220 lb 14.4 oz)     Intake/Output Summary (Last 24 hours) at 04/12/15 1031 Last data filed at 04/12/15 0849  Gross per 24 hour  Intake   1380 ml  Output   1175 ml  Net    205 ml    Exam  General: Well developed, well nourished, NAD  HEENT: NCAT,  mucous membranes moist.   Cardiovascular: S1 S2 auscultated, no rubs, murmurs or gallops  Respiratory: Clear to auscultation bilaterally   Abdomen: Soft, nontender, nondistended, + bowel sounds, catheter noted right side  Extremities: warm dry without cyanosis clubbing or edema  Neuro: AAOx3,  nonfocal  Psych: Normal affect and demeanor  Data Review   Micro Results Recent Results (from the past 240 hour(s))  Urine culture     Status: None   Collection Time: 04/06/15  9:46 PM  Result Value Ref Range Status   Specimen Description URINE, CLEAN CATCH  Final   Special Requests NONE  Final   Culture   Final    MULTIPLE SPECIES PRESENT, SUGGEST RECOLLECTION Performed at [facility]    Report Status 04/08/2015 FINAL  Final  Culture, blood (routine x 2)     Status: None   Collection  Time: 04/07/15  1:55 AM  Result Value Ref Range Status   Specimen Description BLOOD LEFT ARM  Final   Special Requests BOTTLES DRAWN AEROBIC AND ANAEROBIC 10CC  Final   Culture   Final    NO GROWTH 5 DAYS Performed at [facility]    Report Status 04/12/2015 FINAL  Final  Culture, blood (routine x 2)     Status: None   Collection Time: 04/07/15  2:00 AM  Result Value Ref Range Status   Specimen Description BLOOD LEFT HAND  Final   Special Requests BOTTLES DRAWN AEROBIC AND ANAEROBIC 10CC  Final   Culture   Final    NO GROWTH 5 DAYS Performed at [facility]    Report Status 04/12/2015 FINAL  Final    Radiology Reports Nm Renal Imaging Flow W/pharm  04/08/2015  CLINICAL DATA:  Acute kidney disease. History of left renal atrophy and chronic right hydronephrosis. EXAM: NUCLEAR MEDICINE RENAL SCAN WITH DIURETIC ADMINISTRATION TECHNIQUE: Radionuclide angiographic and sequential renal images were obtained after intravenous injection of radiopharmaceutical. Imaging was continued during slow intravenous injection of Lasix approximately 15 minutes after the start of the examination. RADIOPHARMACEUTICALS:  13.9 mCi Technetium-36m MAG3 IV COMPARISON:  None. FINDINGS: Flow: Decreased perfusion to the smaller left kidney. Normal perfusion to the right kidney. Left renogram: Diminished cortical uptake by the small left kidney. There is delayed excretion of the radiopharmaceutical.  Right renogram: Normal cortical uptake. There is diminished clearance of the radiopharmaceutical from the dilated right renal collecting system. Differential: Left kidney = 28 % Right kidney = 72 % T1/2 post Lasix : Left kidney = not achieved min Right kidney = not achieved. Min IMPRESSION: 1. Diminished left renal function. There is delayed perfusion, cortical uptake and excretion of the radiopharmaceutical by the small left kidney. 2. Delayed and diminished clearance of the radiopharmaceutical from the dilated right renal collecting system compatible with partial obstruction. 3. Split renal function is equal to 72% from the right kidney and 28% from the left kidney. Electronically Signed   By: Kerby Moors M.D.   On: 04/08/2015 16:34   US Renal  04/11/2015  CLINICAL DATA:  Hydronephrosis, poorly functioning right ureteral stent, currently on dialysis, elevated creatinine EXAM: RENAL / URINARY TRACT ULTRASOUND COMPLETE COMPARISON:  04/06/2015 CT FINDINGS: Right Kidney: Length: 11.5 cm. Normal echogenicity and cortical thickness. Ureteral stent extends into the upper pole by ultrasound. Moderate hydronephrosis evident. No focal abnormality. Left Kidney: Length: 9.5 cm. Mild cortical thinning but normal echogenicity. Mild left hydronephrosis and proximal hydroureter. Bladder: Appears normal for degree of bladder distention. IMPRESSION: Right greater than left hydronephrosis despite right ureteral stent insertion Electronically Signed   By: Jerilynn Mages.  Shick M.D.   On: 04/11/2015 13:53   Ir Fluoro Guide Cv Line Right  04/10/2015  CLINICAL DATA:  End-stage renal disease, hypertension, no current access to begin dialysis. EXAM: ULTRASOUND GUIDANCE FOR VASCULAR ACCESS RIGHT INTERNAL JUGULAR PERMANENT HEMODIALYSIS CATHETER Date:  10/21/201610/21/2016 10:12 am Radiologist:  M. Daryll Brod, MD Guidance:  Ultrasound and fluoroscopic FLUOROSCOPY TIME:  30 seconds, 10.7 mGy MEDICATIONS AND MEDICAL HISTORY: 2 g Ancef  administered within 1 hour of the procedure, 1 mg Versed, 50 mcg fentanyl ANESTHESIA/SEDATION: 15 minutes CONTRAST:  None. COMPLICATIONS: None immediate PROCEDURE: Informed consent was obtained from the patient following explanation of the procedure, risks, benefits and alternatives. The patient understands, agrees and consents for the procedure. All questions were addressed. A time out was performed. Maximal barrier sterile technique utilized  including caps, mask, sterile gowns, sterile gloves, large sterile drape, hand hygiene, and 2% chlorhexidine scrub. Under sterile conditions and local anesthesia, right internal jugular micropuncture venous access was performed with ultrasound. Images were obtained for documentation. A guide wire was inserted followed by a transitional dilator. Next, a 0.035 guidewire was advanced into the IVC with a 5-French catheter. Measurements were obtained from the right venotomy site to the proximal right atrium. In the right infraclavicular chest, a subcutaneous tunnel was created under sterile conditions and local anesthesia. 1% lidocaine with epinephrine was utilized for this. The 19 cm tip to cuff palindrome catheter was tunneled subcutaneously to the venotomy site and inserted into the SVC/RA junction through a valved peel-away sheath. Position was confirmed with fluoroscopy. Images were obtained for documentation. Blood was aspirated from the catheter followed by saline and heparin flushes. The appropriate volume and strength of heparin was instilled in each lumen. Caps were applied. The catheter was secured at the tunnel site with Gelfoam and a pursestring suture. The venotomy site was closed with subcuticular Vicryl suture. Dermabond was applied to the small right neck incision. A dry sterile dressing was applied. The catheter is ready for use. No immediate complications. IMPRESSION: Ultrasound and fluoroscopically guided right internal jugular tunneled hemodialysis catheter  (Palindrome catheter). Electronically Signed   By: Jerilynn Mages.  Shick M.D.   On: 04/10/2015 11:03   Ir US Guide Vasc Access Right  04/10/2015  CLINICAL DATA:  End-stage renal disease, hypertension, no current access to begin dialysis. EXAM: ULTRASOUND GUIDANCE FOR VASCULAR ACCESS RIGHT INTERNAL JUGULAR PERMANENT HEMODIALYSIS CATHETER Date:  10/21/201610/21/2016 10:12 am Radiologist:  M. Daryll Brod, MD Guidance:  Ultrasound and fluoroscopic FLUOROSCOPY TIME:  30 seconds, 10.7 mGy MEDICATIONS AND MEDICAL HISTORY: 2 g Ancef administered within 1 hour of the procedure, 1 mg Versed, 50 mcg fentanyl ANESTHESIA/SEDATION: 15 minutes CONTRAST:  None. COMPLICATIONS: None immediate PROCEDURE: Informed consent was obtained from the patient following explanation of the procedure, risks, benefits and alternatives. The patient understands, agrees and consents for the procedure. All questions were addressed. A time out was performed. Maximal barrier sterile technique utilized including caps, mask, sterile gowns, sterile gloves, large sterile drape, hand hygiene, and 2% chlorhexidine scrub. Under sterile conditions and local anesthesia, right internal jugular micropuncture venous access was performed with ultrasound. Images were obtained for documentation. A guide wire was inserted followed by a transitional dilator. Next, a 0.035 guidewire was advanced into the IVC with a 5-French catheter. Measurements were obtained from the right venotomy site to the proximal right atrium. In the right infraclavicular chest, a subcutaneous tunnel was created under sterile conditions and local anesthesia. 1% lidocaine with epinephrine was utilized for this. The 19 cm tip to cuff palindrome catheter was tunneled subcutaneously to the venotomy site and inserted into the SVC/RA junction through a valved peel-away sheath. Position was confirmed with fluoroscopy. Images were obtained for documentation. Blood was aspirated from the catheter followed by  saline and heparin flushes. The appropriate volume and strength of heparin was instilled in each lumen. Caps were applied. The catheter was secured at the tunnel site with Gelfoam and a pursestring suture. The venotomy site was closed with subcuticular Vicryl suture. Dermabond was applied to the small right neck incision. A dry sterile dressing was applied. The catheter is ready for use. No immediate complications. IMPRESSION: Ultrasound and fluoroscopically guided right internal jugular tunneled hemodialysis catheter (Palindrome catheter). Electronically Signed   By: Jerilynn Mages.  Shick M.D.   On: 04/10/2015 11:03  Ct Renal Stone Study  04/06/2015  CLINICAL DATA:  Acute onset of renal failure. Nausea. Recent placement of right ureteral stent for obstructing ureteral stone. Initial encounter. EXAM: CT ABDOMEN AND PELVIS WITHOUT CONTRAST TECHNIQUE: Multidetector CT imaging of the abdomen and pelvis was performed following the standard protocol without IV contrast. COMPARISON:  CT of the abdomen and pelvis performed 02/20/2015, and renal ultrasound performed 02/27/2015 FINDINGS: The visualized lung bases are clear. A large hiatal hernia is noted. Mild calcification is noted at the mitral valve. The liver and spleen are unremarkable in appearance. The patient is status post cholecystectomy, with clips noted at the gallbladder fossa. The pancreas and adrenal glands are unremarkable. There is mild right-sided and minimal left-sided hydronephrosis, with diffuse wall thickening at the renal calyces and pelves, extending along both ureters, and mild perinephric stranding. Findings are concerning for pyelonephritis and ureteritis. A right ureteral stent is noted in expected position. No obstructing ureteral stones are seen. There is wall thickening about the bladder, with associated soft tissue inflammation, reflecting underlying cystitis. No free fluid is identified. The small bowel is unremarkable in appearance. The stomach is  within normal limits. No acute vascular abnormalities are seen. The appendix is normal in caliber, without evidence of appendicitis. Mild scattered diverticulosis is noted along the transverse, descending and proximal sigmoid colon, without evidence of diverticulitis. There is a relatively broad-based moderate anterior abdominal wall hernia extending into the pannus, containing only fat. A small periumbilical hernia is also noted, to the right of the umbilicus, containing only fat. The patient is status post hysterectomy. No suspicious adnexal masses are seen. No inguinal lymphadenopathy is seen. No acute osseous abnormalities are identified. There is grade 1 retrolisthesis of L2 on L3, with associated vacuum phenomenon and endplate sclerotic change, and minimal grade 1 retrolisthesis of L3 on L4. IMPRESSION: 1. Mild right-sided and minimal left-sided hydronephrosis, with diffuse wall thickening at the renal calyces and pelves, extending along both ureters, and mild perinephric stranding. Findings concerning for bilateral pyelonephritis and ureteritis, somewhat more prominent than in September. Right ureteral stent noted in expected position. No obstructing ureteral stone seen. 2. Diffuse bladder wall thickening, with associated soft tissue inflammation, concerning for underlying cystitis. 3. Large hiatal hernia noted. 4. Mild scattered diverticulosis along the transverse, descending and proximal sigmoid colon, without evidence of diverticulitis. 5. Relatively broad-based moderate anterior abdominal wall hernia extending into the pannus, containing only fat. Small periumbilical hernia to the right of the umbilicus, also containing only fat. 6. Mild degenerative change at the mid lumbar spine. Electronically Signed   By: Garald Balding M.D.   On: 04/06/2015 22:21   Ir Nephrostomy Placement Right  04/11/2015  CLINICAL DATA:  PERSISTENT RIGHT HYDRONEPHROSIS DESPITE URETERAL STENT INSERTION. RENAL FAILURE, ELEVATED  CREATININE. EXAM: ULTRASOUND FLUOROSCOPIC 10 FRENCH RIGHT NEPHROSTOMY INSERTION Date:  10/22/201610/22/2016 2:23 pm Radiologist:  M. Daryll Brod, MD Guidance:  Ultrasound and fluoroscopic FLUOROSCOPY TIME:  1 minutes 12 seconds, 60 mGy MEDICATIONS AND MEDICAL HISTORY: Patient is already receiving 1 g Rocephin every 24 hours. 1.5 mg Versed, 75 mcg fentanyl ANESTHESIA/SEDATION: 10 minutes CONTRAST:  32mL OMNIPAQUE IOHEXOL 300 MG/ML  SOLN COMPLICATIONS: None immediate PROCEDURE: Informed consent was obtained from the patient following explanation of the procedure, risks, benefits and alternatives. The patient understands, agrees and consents for the procedure. All questions were addressed. A time out was performed. Maximal barrier sterile technique utilized including caps, mask, sterile gowns, sterile gloves, large sterile drape, hand hygiene, and ChloraPrep. Under sterile conditions  and local anesthesia, ultrasound micropuncture needle access performed of a right mid to lower pole dilated calyx. Needle position confirmed with ultrasound. Images obtained for documentation. There was return of cloudy exudative urine. Guidewire advanced easily into the renal pelvis. Accustick dilator set advanced. Amplatz guidewire exchange performed. Tract dilatation performed to advance a 10 French nephrostomy with the retention loop formed in the renal pelvis. Contrast injection confirms position. Gravity drainage bag connected. Catheter secured with a Prolene suture and a sterile dressing. No immediate complication. Patient tolerated the procedure well. IMPRESSION: Successful ultrasound and fluoroscopic 10 French right nephrostomy insertion. Electronically Signed   By: Jerilynn Mages.  Shick M.D.   On: 04/11/2015 14:33    CBC  Recent Labs Lab 04/06/15 2108  04/08/15 0915 04/09/15 0525 04/11/15 0343 04/11/15 1450 04/12/15 0327  WBC 11.2*  < > 6.9 10.6* 7.3 6.9 7.2  HGB 9.1*  < > 7.3* 8.1* 7.4* 8.1* 7.1*  HCT 29.5*  < > 23.5* 26.2*  24.2* 27.4* 23.7*  PLT 493*  < > 350 392 321 367 294  MCV 87.3  < > 86.7 85.9 88.3 89.0 88.8  MCH 26.9  < > 26.9 26.6 27.0 26.3 26.6  MCHC 30.8  < > 31.1 30.9 30.6 29.6* 30.0  RDW 16.6*  < > 16.9* 16.7* 16.8* 16.6* 16.6*  LYMPHSABS 1.6  --  1.0  --   --   --   --   MONOABS 0.6  --  0.4  --   --   --   --   EOSABS 0.3  --  0.3  --   --   --   --   BASOSABS 0.1  --  0.0  --   --   --   --   < > = values in this interval not displayed.  Chemistries   Recent Labs Lab 04/06/15 2108  04/09/15 0525 04/10/15 1935 04/11/15 0343 04/11/15 1450 04/12/15 0327  NA 135  < > 140 138 135 134* 133*  K 4.3  < > 3.2* 3.2* 3.6 3.3* 4.4  CL 106  < > 98* 90* 93* 89* 91*  CO2 20*  < > 29 35* 34* 35* 34*  GLUCOSE 119*  < > 108* 106* 113* 112* 108*  BUN 86*  < > 62* 49* 18 19 22*  CREATININE 8.02*  < > 5.76* 5.79* 3.49* 4.09* 4.26*  CALCIUM 9.6  < > 8.7* 8.5* 7.8* 8.3* 7.8*  AST 17  --   --   --   --   --   --   ALT 15  --   --   --   --   --   --   ALKPHOS 76  --   --   --   --   --   --   BILITOT 0.5  --   --   --   --   --   --   < > = values in this interval not displayed. ------------------------------------------------------------------------------------------------------------------ estimated creatinine clearance is 12.6 mL/min (by C-G formula based on Cr of 4.26). ------------------------------------------------------------------------------------------------------------------ No results for input(s): HGBA1C in the last 72 hours. ------------------------------------------------------------------------------------------------------------------ No results for input(s): CHOL, HDL, LDLCALC, TRIG, CHOLHDL, LDLDIRECT in the last 72 hours. ------------------------------------------------------------------------------------------------------------------ No results for input(s): TSH, T4TOTAL, T3FREE, THYROIDAB in the last 72 hours.  Invalid input(s):  FREET3 ------------------------------------------------------------------------------------------------------------------ No results for input(s): VITAMINB12, FOLATE, FERRITIN, TIBC, IRON, RETICCTPCT in the last 72 hours.  Coagulation profile No results for input(s): INR, PROTIME  in the last 168 hours.  No results for input(s): DDIMER in the last 72 hours.  Cardiac Enzymes No results for input(s): CKMB, TROPONINI, MYOGLOBIN in the last 168 hours.  Invalid input(s): CK ------------------------------------------------------------------------------------------------------------------ Invalid input(s): POCBNP    Tannor Pyon D.O. on 04/12/2015 at 10:31 AM  Between 7am to 7pm - Pager - [phone]  After 7pm go to [email or web address] - password [email or web address]  And look for the night coverage person covering for me after hours  [facility] Office  [phone]

## 2015-04-13 DIAGNOSIS — Z9289 Personal history of other medical treatment: Secondary | ICD-10-CM

## 2015-04-13 DIAGNOSIS — N1330 Unspecified hydronephrosis: Secondary | ICD-10-CM | POA: Insufficient documentation

## 2015-04-13 HISTORY — DX: Personal history of other medical treatment: Z92.89

## 2015-04-13 LAB — PREPARE RBC (CROSSMATCH)

## 2015-04-13 LAB — CBC
HCT: 23.2 % — ABNORMAL LOW (ref 36.0–46.0)
Hemoglobin: 6.9 g/dL — CL (ref 12.0–15.0)
MCH: 26.3 pg (ref 26.0–34.0)
MCHC: 29.3 g/dL — ABNORMAL LOW (ref 30.0–36.0)
MCV: 89.6 fL (ref 78.0–100.0)
Platelets: 266 10*3/uL (ref 150–400)
RBC: 2.59 MIL/uL — ABNORMAL LOW (ref 3.87–5.11)
RDW: 16.6 % — ABNORMAL HIGH (ref 11.5–15.5)
WBC: 6.5 10*3/uL (ref 4.0–10.5)

## 2015-04-13 LAB — RENAL FUNCTION PANEL
Albumin: 1.9 g/dL — ABNORMAL LOW (ref 3.5–5.0)
Anion gap: 9 (ref 5–15)
BUN: 22 mg/dL — ABNORMAL HIGH (ref 6–20)
CO2: 38 mmol/L — ABNORMAL HIGH (ref 22–32)
Calcium: 8.5 mg/dL — ABNORMAL LOW (ref 8.9–10.3)
Chloride: 90 mmol/L — ABNORMAL LOW (ref 101–111)
Creatinine, Ser: 4.44 mg/dL — ABNORMAL HIGH (ref 0.44–1.00)
GFR calc Af Amer: 11 mL/min — ABNORMAL LOW (ref >60)
GFR calc non Af Amer: 9 mL/min — ABNORMAL LOW (ref >60)
Glucose, Bld: 113 mg/dL — ABNORMAL HIGH (ref 65–99)
Phosphorus: 2.6 mg/dL (ref 2.5–4.6)
Potassium: 3.7 mmol/L (ref 3.5–5.1)
Sodium: 137 mmol/L (ref 135–145)

## 2015-04-13 MED ORDER — SODIUM CHLORIDE 0.9 % IV SOLN: Freq: Once | INTRAVENOUS | Status: DC

## 2015-04-13 MED ORDER — NEPRO/CARBSTEADY PO LIQD
237.0000 mL | Freq: Two times a day (BID) | ORAL | Status: DC
  Administered 2015-04-14 – 2015-04-16 (×3): 237 mL via ORAL

## 2015-04-13 NOTE — Progress Notes (Signed)
S: No N/V. Appetite improving some O:BP 110/51 mmHg  Pulse 90  Temp(Src) 99.2 F (37.3 C) (Oral)  Resp 16  Ht 5\' 3"  (1.6 m)  Wt 84.012 kg (185 lb 3.4 oz)  BMI 32.82 kg/m2  SpO2 95%  LMP 01/19/1992  Intake/Output Summary (Last 24 hours) at 04/13/15 1042 Last data filed at 04/13/15 0600  Gross per 24 hour  Intake 1681.67 ml  Output   1351 ml  Net 330.67 ml   Weight change: 0.142 kg (5 oz) [id number] and alert [facility]:RRR Resp:clear Abd:+ BS NTND Ext:no edema NEURO:CNI Ox3 no asterixis Rt Permcath Rt PCN   . famotidine  10 mg Oral BID  . feeding supplement  1 Container Oral TID BM  . heparin  5,000 Units Subcutaneous 3 times per day  . latanoprost  1 drop Both Eyes QHS  . saccharomyces boulardii  250 mg Oral BID  . timolol  1 drop Both Eyes q morning - 10a   US Renal  04/11/2015  CLINICAL DATA:  Hydronephrosis, poorly functioning right ureteral stent, currently on dialysis, elevated creatinine EXAM: RENAL / URINARY TRACT ULTRASOUND COMPLETE COMPARISON:  04/06/2015 CT FINDINGS: Right Kidney: Length: 11.5 cm. Normal echogenicity and cortical thickness. Ureteral stent extends into the upper pole by ultrasound. Moderate hydronephrosis evident. No focal abnormality. Left Kidney: Length: 9.5 cm. Mild cortical thinning but normal echogenicity. Mild left hydronephrosis and proximal hydroureter. Bladder: Appears normal for degree of bladder distention. IMPRESSION: Right greater than left hydronephrosis despite right ureteral stent insertion Electronically Signed   By: Jerilynn Mages.  Shick M.D.   On: 04/11/2015 13:53   Ir Nephrostomy Placement Right  04/11/2015  CLINICAL DATA:  PERSISTENT RIGHT HYDRONEPHROSIS DESPITE URETERAL STENT INSERTION. RENAL FAILURE, ELEVATED CREATININE. EXAM: ULTRASOUND FLUOROSCOPIC 10 FRENCH RIGHT NEPHROSTOMY INSERTION Date:  10/22/201610/22/2016 2:23 pm Radiologist:  M. Daryll Brod, MD Guidance:  Ultrasound and fluoroscopic FLUOROSCOPY TIME:  1 minutes 12 seconds, 60 mGy  MEDICATIONS AND MEDICAL HISTORY: Patient is already receiving 1 g Rocephin every 24 hours. 1.5 mg Versed, 75 mcg fentanyl ANESTHESIA/SEDATION: 10 minutes CONTRAST:  50mL OMNIPAQUE IOHEXOL 300 MG/ML  SOLN COMPLICATIONS: None immediate PROCEDURE: Informed consent was obtained from the patient following explanation of the procedure, risks, benefits and alternatives. The patient understands, agrees and consents for the procedure. All questions were addressed. A time out was performed. Maximal barrier sterile technique utilized including caps, mask, sterile gowns, sterile gloves, large sterile drape, hand hygiene, and ChloraPrep. Under sterile conditions and local anesthesia, ultrasound micropuncture needle access performed of a right mid to lower pole dilated calyx. Needle position confirmed with ultrasound. Images obtained for documentation. There was return of cloudy exudative urine. Guidewire advanced easily into the renal pelvis. Accustick dilator set advanced. Amplatz guidewire exchange performed. Tract dilatation performed to advance a 10 French nephrostomy with the retention loop formed in the renal pelvis. Contrast injection confirms position. Gravity drainage bag connected. Catheter secured with a Prolene suture and a sterile dressing. No immediate complication. Patient tolerated the procedure well. IMPRESSION: Successful ultrasound and fluoroscopic 10 French right nephrostomy insertion. Electronically Signed   By: Jerilynn Mages.  Shick M.D.   On: 04/11/2015 14:33   BMET    Component Value Date/Time   NA 137 04/13/2015 0451   K 3.7 04/13/2015 0451   CL 90* 04/13/2015 0451   CO2 38* 04/13/2015 0451   GLUCOSE 113* 04/13/2015 0451   BUN 22* 04/13/2015 0451   CREATININE 4.44* 04/13/2015 0451   CALCIUM 8.5* 04/13/2015 0451   GFRNONAA 9*  04/13/2015 0451   GFRAA 11* 04/13/2015 0451   CBC    Component Value Date/Time   WBC 6.5 04/13/2015 0451   RBC 2.59* 04/13/2015 0451   RBC 2.71* 04/08/2015 0915   HGB 6.9*  04/13/2015 0451   HCT 23.2* 04/13/2015 0451   HCT 23.6* 04/08/2015 0545   PLT 266 04/13/2015 0451   MCV 89.6 04/13/2015 0451   MCH 26.3 04/13/2015 0451   MCHC 29.3* 04/13/2015 0451   RDW 16.6* 04/13/2015 0451   LYMPHSABS 1.0 04/08/2015 0915   MONOABS 0.4 04/08/2015 0915   EOSABS 0.3 04/08/2015 0915   BASOSABS 0.0 04/08/2015 0915     Assessment:  1. Acute on CKD 3/4 sec to recurrent obstruction.  Now with Rt perc and UO has significantly increased though Scr sl higher 2. Anemia 3. HTN  Plan: 1. DC IV bicarb.  She says she can keep up orally and bicarb high 2. Daily Scr.  No need for HD today.  Hopefully renal fx will start to improve 3. Agree with transfusion   Sair Faulcon T

## 2015-04-13 NOTE — Progress Notes (Signed)
Nutrition Follow-up  DOCUMENTATION CODES:   Not applicable  INTERVENTION:   Provide Nepro Shake po BID, each supplement provides 425 kcal and 19 grams protein.  Discontinue Boost Breeze.   Encourage adequate PO intake.   NUTRITION DIAGNOSIS:   Inadequate oral intake related to poor appetite, chronic illness as evidenced by per patient/family report; ongoing  GOAL:   Patient will meet greater than or equal to 90% of their needs; progressing  MONITOR:   PO intake, Supplement acceptance, Weight trends, Labs, I & O's  REASON FOR ASSESSMENT:   Malnutrition Screening Tool    ASSESSMENT:   Pt reports poor appetite since hospitlization in April. Pt's sister reports she will eat a sleeve of saltine crackers throughout the day and that will be it. Pt has CKD Stg III. 46#/20% severe wt loss, though fluid is possible culprit alongside muscle and fat loss. Could not conduct NFPA due to edema. Will provide boost breeze x3 for extra calories during stay. Pt drinks ensure at home, but says that ensure plus made her feel bloated during last hospitilization.  Pt reports appetite has been good, however reports she has been experiencing early satiety. Meal completion has been 50%. Pt has been refusing her Boost Breeze as she does not like them. RD to discontinue. Noted pt was drinking Ensure during time of visit. HD started 10/21. Pt is agreeable to Nepro Shake to aid in caloric and protein needs. RD to order. Pt was encouraged to eat her food at meals and to drink her supplements.   Pt with no observed significant fat or muscle mass loss.   Labs and medications reviewed.   Diet Order:  Diet Heart Room service appropriate?: Yes; Fluid consistency:: Thin  Skin:  Reviewed, no issues  Last BM:  10/24  Height:   Ht Readings from Last 1 Encounters:  04/11/15 5\' 3"  (1.6 m)    Weight:   Wt Readings from Last 1 Encounters:  04/12/15 185 lb 3.4 oz (84.012 kg)    Ideal Body Weight:   52.27 kg  BMI:  Body mass index is 32.82 kg/(m^2).  Estimated Nutritional Needs:   Kcal:  1800-2000  Protein:  90-105 grams  Fluid:  Per MD  EDUCATION NEEDS:   No education needs identified at this time  Corrin Parker, MS, RD, LDN Pager # [phone] After hours/ weekend pager # [phone]

## 2015-04-13 NOTE — Care Management Important Message (Signed)
Important Message  Patient Details  Name: [name] MRN: [id number] Date of Birth: [birth year]   Medicare Important Message Given:  Yes-second notification given    Delorse Lek 04/13/2015, 12:27 PM

## 2015-04-13 NOTE — Progress Notes (Signed)
CRITICAL VALUE ALERT  Critical value received: Hgb 6.9 Date of notification:  04/13/2015  Time of notification:  0700  Critical value read back:Yes.    Nurse who received alert:  Earleen Reaper RN   MD notified (1st page):  Dr Adah Perl  Time of first page:  0700  MD notified (2nd page):  Time of second page:  Responding MD:  Dr Adah Perl  Per MD, wait for nephrology to round.  If patient goes to hemodialysis, patient will have transfusion in HD.  If not, then MD will order.    Earleen Reaper RN-BC, WTA  Time MD responded:  [phone]

## 2015-04-13 NOTE — Progress Notes (Signed)
Referring Physician(s): Urology  Chief Complaint: Persistent right hydronephrosis secondary to ureteral obstruction s/p right PCN 10/22  Subjective: Patient states minimal pain at right PCN, admits to good yellow urine output in bag   Allergies: Vicodin  Medications: Prior to Admission medications   Medication Sig Start Date End Date Taking? Authorizing Provider  acetaminophen (TYLENOL) 500 MG tablet Take 500 mg by mouth every 6 (six) hours as needed for moderate pain.   Yes Historical Provider, MD  bimatoprost (LUMIGAN) 0.01 % SOLN Place 1 drop into both eyes at bedtime.   Yes Historical Provider, MD  metoprolol tartrate (LOPRESSOR) 25 MG tablet Take 1 tablet (25 mg total) by mouth 2 (two) times daily. Patient taking differently: Take 25 mg by mouth daily.  11/21/14  Yes Jettie Booze, MD  ondansetron (ZOFRAN ODT) 4 MG disintegrating tablet Take 1 tablet (4 mg total) by mouth every 8 (eight) hours as needed for nausea or vomiting. 11/27/14  Yes Velvet Bathe, MD  ranitidine (ZANTAC) 150 MG tablet Take 150 mg by mouth 2 (two) times daily as needed for heartburn.   Yes Historical Provider, MD  timolol (BETIMOL) 0.5 % ophthalmic solution Place 1 drop into both eyes every morning.    Yes Historical Provider, MD  amLODipine (NORVASC) 5 MG tablet Take 1 tablet (5 mg total) by mouth every evening. Patient not taking: Reported on 02/25/2015 11/21/14   Jettie Booze, MD  cephALEXin (KEFLEX) 500 MG capsule Take 1 capsule (500 mg total) by mouth 2 (two) times daily. Patient not taking: Reported on 04/06/2015 03/01/15   Oswald Hillock, MD   Vital Signs: BP 110/51 mmHg  Pulse 90  Temp(Src) 99.2 F (37.3 C) (Oral)  Resp 16  Ht 5\' 3"  (1.6 m)  Wt 185 lb 3.4 oz (84.012 kg)  BMI 32.82 kg/m2  SpO2 95%  LMP 01/19/1992  Physical Exam General: A&Ox3, NAD Abd: Soft, NT, ND, Right PCN intact, NT, 200 cc clear yellow urine in bag, 1500 cc/24 hrs  Imaging: US Renal  04/11/2015  CLINICAL  DATA:  Hydronephrosis, poorly functioning right ureteral stent, currently on dialysis, elevated creatinine EXAM: RENAL / URINARY TRACT ULTRASOUND COMPLETE COMPARISON:  04/06/2015 CT FINDINGS: Right Kidney: Length: 11.5 cm. Normal echogenicity and cortical thickness. Ureteral stent extends into the upper pole by ultrasound. Moderate hydronephrosis evident. No focal abnormality. Left Kidney: Length: 9.5 cm. Mild cortical thinning but normal echogenicity. Mild left hydronephrosis and proximal hydroureter. Bladder: Appears normal for degree of bladder distention. IMPRESSION: Right greater than left hydronephrosis despite right ureteral stent insertion Electronically Signed   By: Jerilynn Mages.  Shick M.D.   On: 04/11/2015 13:53   Ir Fluoro Guide Cv Line Right  04/10/2015  CLINICAL DATA:  End-stage renal disease, hypertension, no current access to begin dialysis. EXAM: ULTRASOUND GUIDANCE FOR VASCULAR ACCESS RIGHT INTERNAL JUGULAR PERMANENT HEMODIALYSIS CATHETER Date:  10/21/201610/21/2016 10:12 am Radiologist:  M. Daryll Brod, MD Guidance:  Ultrasound and fluoroscopic FLUOROSCOPY TIME:  30 seconds, 10.7 mGy MEDICATIONS AND MEDICAL HISTORY: 2 g Ancef administered within 1 hour of the procedure, 1 mg Versed, 50 mcg fentanyl ANESTHESIA/SEDATION: 15 minutes CONTRAST:  None. COMPLICATIONS: None immediate PROCEDURE: Informed consent was obtained from the patient following explanation of the procedure, risks, benefits and alternatives. The patient understands, agrees and consents for the procedure. All questions were addressed. A time out was performed. Maximal barrier sterile technique utilized including caps, mask, sterile gowns, sterile gloves, large sterile drape, hand hygiene, and 2% chlorhexidine scrub. Under  sterile conditions and local anesthesia, right internal jugular micropuncture venous access was performed with ultrasound. Images were obtained for documentation. A guide wire was inserted followed by a transitional  dilator. Next, a 0.035 guidewire was advanced into the IVC with a 5-French catheter. Measurements were obtained from the right venotomy site to the proximal right atrium. In the right infraclavicular chest, a subcutaneous tunnel was created under sterile conditions and local anesthesia. 1% lidocaine with epinephrine was utilized for this. The 19 cm tip to cuff palindrome catheter was tunneled subcutaneously to the venotomy site and inserted into the SVC/RA junction through a valved peel-away sheath. Position was confirmed with fluoroscopy. Images were obtained for documentation. Blood was aspirated from the catheter followed by saline and heparin flushes. The appropriate volume and strength of heparin was instilled in each lumen. Caps were applied. The catheter was secured at the tunnel site with Gelfoam and a pursestring suture. The venotomy site was closed with subcuticular Vicryl suture. Dermabond was applied to the small right neck incision. A dry sterile dressing was applied. The catheter is ready for use. No immediate complications. IMPRESSION: Ultrasound and fluoroscopically guided right internal jugular tunneled hemodialysis catheter (Palindrome catheter). Electronically Signed   By: Jerilynn Mages.  Shick M.D.   On: 04/10/2015 11:03   Ir US Guide Vasc Access Right  04/10/2015  CLINICAL DATA:  End-stage renal disease, hypertension, no current access to begin dialysis. EXAM: ULTRASOUND GUIDANCE FOR VASCULAR ACCESS RIGHT INTERNAL JUGULAR PERMANENT HEMODIALYSIS CATHETER Date:  10/21/201610/21/2016 10:12 am Radiologist:  M. Daryll Brod, MD Guidance:  Ultrasound and fluoroscopic FLUOROSCOPY TIME:  30 seconds, 10.7 mGy MEDICATIONS AND MEDICAL HISTORY: 2 g Ancef administered within 1 hour of the procedure, 1 mg Versed, 50 mcg fentanyl ANESTHESIA/SEDATION: 15 minutes CONTRAST:  None. COMPLICATIONS: None immediate PROCEDURE: Informed consent was obtained from the patient following explanation of the procedure, risks,  benefits and alternatives. The patient understands, agrees and consents for the procedure. All questions were addressed. A time out was performed. Maximal barrier sterile technique utilized including caps, mask, sterile gowns, sterile gloves, large sterile drape, hand hygiene, and 2% chlorhexidine scrub. Under sterile conditions and local anesthesia, right internal jugular micropuncture venous access was performed with ultrasound. Images were obtained for documentation. A guide wire was inserted followed by a transitional dilator. Next, a 0.035 guidewire was advanced into the IVC with a 5-French catheter. Measurements were obtained from the right venotomy site to the proximal right atrium. In the right infraclavicular chest, a subcutaneous tunnel was created under sterile conditions and local anesthesia. 1% lidocaine with epinephrine was utilized for this. The 19 cm tip to cuff palindrome catheter was tunneled subcutaneously to the venotomy site and inserted into the SVC/RA junction through a valved peel-away sheath. Position was confirmed with fluoroscopy. Images were obtained for documentation. Blood was aspirated from the catheter followed by saline and heparin flushes. The appropriate volume and strength of heparin was instilled in each lumen. Caps were applied. The catheter was secured at the tunnel site with Gelfoam and a pursestring suture. The venotomy site was closed with subcuticular Vicryl suture. Dermabond was applied to the small right neck incision. A dry sterile dressing was applied. The catheter is ready for use. No immediate complications. IMPRESSION: Ultrasound and fluoroscopically guided right internal jugular tunneled hemodialysis catheter (Palindrome catheter). Electronically Signed   By: Jerilynn Mages.  Shick M.D.   On: 04/10/2015 11:03   Ir Nephrostomy Placement Right  04/11/2015  CLINICAL DATA:  PERSISTENT RIGHT HYDRONEPHROSIS DESPITE URETERAL STENT INSERTION.  RENAL FAILURE, ELEVATED CREATININE.  EXAM: ULTRASOUND FLUOROSCOPIC 10 FRENCH RIGHT NEPHROSTOMY INSERTION Date:  10/22/201610/22/2016 2:23 pm Radiologist:  M. Daryll Brod, MD Guidance:  Ultrasound and fluoroscopic FLUOROSCOPY TIME:  1 minutes 12 seconds, 60 mGy MEDICATIONS AND MEDICAL HISTORY: Patient is already receiving 1 g Rocephin every 24 hours. 1.5 mg Versed, 75 mcg fentanyl ANESTHESIA/SEDATION: 10 minutes CONTRAST:  33mL OMNIPAQUE IOHEXOL 300 MG/ML  SOLN COMPLICATIONS: None immediate PROCEDURE: Informed consent was obtained from the patient following explanation of the procedure, risks, benefits and alternatives. The patient understands, agrees and consents for the procedure. All questions were addressed. A time out was performed. Maximal barrier sterile technique utilized including caps, mask, sterile gowns, sterile gloves, large sterile drape, hand hygiene, and ChloraPrep. Under sterile conditions and local anesthesia, ultrasound micropuncture needle access performed of a right mid to lower pole dilated calyx. Needle position confirmed with ultrasound. Images obtained for documentation. There was return of cloudy exudative urine. Guidewire advanced easily into the renal pelvis. Accustick dilator set advanced. Amplatz guidewire exchange performed. Tract dilatation performed to advance a 10 French nephrostomy with the retention loop formed in the renal pelvis. Contrast injection confirms position. Gravity drainage bag connected. Catheter secured with a Prolene suture and a sterile dressing. No immediate complication. Patient tolerated the procedure well. IMPRESSION: Successful ultrasound and fluoroscopic 10 French right nephrostomy insertion. Electronically Signed   By: Jerilynn Mages.  Shick M.D.   On: 04/11/2015 14:33    Labs:  CBC:  Recent Labs  04/11/15 0343 04/11/15 1450 04/12/15 0327 04/13/15 0451  WBC 7.3 6.9 7.2 6.5  HGB 7.4* 8.1* 7.1* 6.9*  HCT 24.2* 27.4* 23.7* 23.2*  PLT 321 367 294 266    COAGS:  Recent Labs  11/23/14 0305  02/26/15 0015  INR 1.31 1.17  APTT  --  33    BMP:  Recent Labs  04/11/15 0343 04/11/15 1450 04/12/15 0327 04/13/15 0451  NA 135 134* 133* 137  K 3.6 3.3* 4.4 3.7  CL 93* 89* 91* 90*  CO2 34* 35* 34* 38*  GLUCOSE 113* 112* 108* 113*  BUN 18 19 22* 22*  CALCIUM 7.8* 8.3* 7.8* 8.5*  CREATININE 3.49* 4.09* 4.26* 4.44*  GFRNONAA 12* 10* 10* 9*  GFRAA 14* 12* 11* 11*    LIVER FUNCTION TESTS:  Recent Labs  11/22/14 1945 11/23/14 0305 02/25/15 1930 04/06/15 2108 04/11/15 0343 04/11/15 1450 04/12/15 0327 04/13/15 0451  BILITOT 0.5 0.3 0.4 0.5  --   --   --   --   AST 22 16 18 17   --   --   --   --   ALT 27 20 12* 15  --   --   --   --   ALKPHOS 166* 133* 82 76  --   --   --   --   PROT 6.4* 5.4* 7.7 7.2  --   --   --   --   ALBUMIN 2.4* 1.9* 3.6 3.1* 2.2* 2.5* 2.0* 1.9*    Assessment and Plan: Acute on chronic kidney disease Persistent right hydronephrosis secondary to ureteral obstruction despite right JJ stent S/p right PCN 10/22, good yellow urine output, Cr still elevated 4.4 (4.2)- continue to follow daily output Plans per Urology and Nephrology    Signed: Hedy Jacob 04/13/2015, 12:12 PM   I spent a total of 15 Minutes at the the patient's bedside AND on the patient's hospital floor or unit, greater than 50% of which was counseling/coordinating care for persistent  right hydronephrosis secondary to ureteral obstruction.

## 2015-04-13 NOTE — Progress Notes (Addendum)
[facility]                                                                              Patient Demographics  [name], is a 69 y.o. female, DOB - 1946-05-22, BF:[id number]  Admit date - 04/06/2015   Admitting Physician Waldemar Dickens, MD  Outpatient Primary MD for the patient is [name], MD  LOS - 7   No chief complaint on file.     HPI on 04/07/2015 by Dr. Linna Darner [name] is a 69 y.o. female  Patient was seen today by her primary care physician who drew routine labs admitting: The patient to report elevated creatinine and ask her to go to the emergency room for further evaluation. With the exception of one-week history of intermittent nausea, dysuria, and left lower back pain patient states she's been in her normal state of health and had no inkling of any underlying illness or recent echo the emergency room. Patient has not done anything for her symptoms. Symptoms are not better or worsens onset. Patient had a right ureteral stent placed on 02/26/2015. She states she's had no palpitations procedure. She sees Dr. Florene Glen is her nephrologist as outpatient.  Interim history Patient felt to be uremic for 2 months and requires HD.  Transferred from [facility] to [facility] and HD started on 10/21.  Assessment & Plan   Acute on chronic kidney disease, Stage IV-V with uremia -Upon admission, Cr 8.02. Trending downward to 3.49, HD started 10/21 -Nephrology consulted and appreciated -IR placed HD cath and right nephrostomy- set to gravity -UOP 1550 over past 24hrs -Spoke with nephrology, no plans for HD today  Pyelonephritis/UTI -CT abdomen and pelvis noted perinephric stranding -Patient has had some back pain currently afebrile, WBCs normalized. -urine culture showed multiple species present -Patient received 7 days of Rocephin -Blood cultures show no growth to date   Nephroureterolithiasis/right hydronephrosis -Patient did have stent placed by Dr.  Gaynelle Arabian approximately one month ago -Patient had right PCN placed by IR 10/22  Nausea -Likely secondary to uremia- has improved -Continue antiemetics as needed   Anemia of chronic disease -Hemoglobin currently 6.9 -dose of feraheme given -Will order 1uPRBCs and continue to monitor CBC  Hypertension -Stable, metoprolol and amlodipine held   Headache (frontal) -Improving, Continue pain control and saline nasal spray  Hypokalemia -Resolved, continue to monitor BMP  Code Status: Full  Family Communication: Daughter at bedside  Disposition Plan: Admitted. Continue to monitor UOP and will transfuse 1uPRBCs.  Time Spent in minutes   30 minutes  Procedures  Renal Scan IR placement of RIJ HD cath IR Insertion of right nephrostomy   Consults   Nephrology Interventional radiology Vascular surgery Urology  DVT Prophylaxis  heparin  Lab Results  Component Value Date   PLT 266 04/13/2015    Medications  Scheduled Meds: . cefTRIAXone (ROCEPHIN)  IV  1 g Intravenous Q24H  . famotidine  10 mg Oral BID  . feeding supplement  1 Container Oral TID BM  . heparin  5,000 Units Subcutaneous 3 times per day  . latanoprost  1 drop Both Eyes QHS  . saccharomyces boulardii  250 mg Oral BID  .  timolol  1 drop Both Eyes q morning - 10a   Continuous Infusions: . dextrose 5 % 1,000 mL with sodium bicarbonate 150 mEq infusion 50 mL/hr at 04/12/15 1044   PRN Meds:.sodium chloride, sodium chloride, acetaminophen **OR** acetaminophen, acetaminophen **OR** acetaminophen, alteplase, butalbital-acetaminophen-caffeine, calcium carbonate (dosed in mg elemental calcium), camphor-menthol **AND** hydrOXYzine, docusate sodium, feeding supplement (NEPRO CARB STEADY), heparin, heparin, iohexol, lidocaine (PF), lidocaine-prilocaine, ondansetron **OR** ondansetron (ZOFRAN) IV, ondansetron **OR** ondansetron (ZOFRAN) IV, oxyCODONE, pentafluoroprop-tetrafluoroeth, prochlorperazine, sodium chloride,  sorbitol, zolpidem  Antibiotics    Anti-infectives    Start     Dose/Rate Route Frequency Ordered Stop   04/11/15 1500  cefTRIAXone (ROCEPHIN) 1 g in dextrose 5 % 50 mL IVPB     1 g 100 mL/hr over 30 Minutes Intravenous Every 24 hours 04/11/15 1417     04/10/15 0943  ceFAZolin (ANCEF) 2-3 GM-% IVPB SOLR    Comments:  Soyla Dryer   : cabinet override      04/10/15 0943 04/10/15 2159   04/07/15 2200  cefTRIAXone (ROCEPHIN) 1 g in dextrose 5 % 50 mL IVPB  Status:  Discontinued     1 g 100 mL/hr over 30 Minutes Intravenous Every 24 hours 04/07/15 0042 04/11/15 1417   04/06/15 2230  cefTRIAXone (ROCEPHIN) 1 g in dextrose 5 % 50 mL IVPB     1 g 100 mL/hr over 30 Minutes Intravenous  Once 04/06/15 2221 04/06/15 2312        Subjective:   [name] seen and examined today.  Patient has no complaints today.  She denies nausea or headache.  Denies chest pain, shortness of breath, change in bowel pattern.   Objective:   Filed Vitals:   04/12/15 0848 04/12/15 1818 04/12/15 2117 04/13/15 0431  BP: 113/54 110/47 117/59 110/51  Pulse: 110 99 93 90  Temp: 99.5 F (37.5 C) 99.6 F (37.6 C) 99.1 F (37.3 C) 99.2 F (37.3 C)  TempSrc: Oral Oral Oral Oral  Resp: 15 16 16 16   Height:      Weight:   84.012 kg (185 lb 3.4 oz)   SpO2: 91% 95% 93% 95%    Wt Readings from Last 3 Encounters:  04/12/15 84.012 kg (185 lb 3.4 oz)  03/01/15 90.357 kg (199 lb 3.2 oz)  11/24/14 100.2 kg (220 lb 14.4 oz)     Intake/Output Summary (Last 24 hours) at 04/13/15 1023 Last data filed at 04/13/15 0600  Gross per 24 hour  Intake 1681.67 ml  Output   1551 ml  Net 130.67 ml    Exam  General: Well developed, well nourished, NAD  HEENT: NCAT,  mucous membranes moist.   Cardiovascular: S1 S2 auscultated, RRR, no rubs, murmurs or gallops  Respiratory: Clear to auscultation bilaterally   Abdomen: Soft, nontender, nondistended, + bowel sounds, catheter noted right side  Extremities:  warm dry without cyanosis clubbing or edema  Neuro: AAOx3, nonfocal  Psych: Normal affect and demeanor, pleasant  Data Review   Micro Results Recent Results (from the past 240 hour(s))  Urine culture     Status: None   Collection Time: 04/06/15  9:46 PM  Result Value Ref Range Status   Specimen Description URINE, CLEAN CATCH  Final   Special Requests NONE  Final   Culture   Final    MULTIPLE SPECIES PRESENT, SUGGEST RECOLLECTION Performed at [facility]    Report Status 04/08/2015 FINAL  Final  Culture, blood (routine x 2)     Status: None  Collection Time: 04/07/15  1:55 AM  Result Value Ref Range Status   Specimen Description BLOOD LEFT ARM  Final   Special Requests BOTTLES DRAWN AEROBIC AND ANAEROBIC 10CC  Final   Culture   Final    NO GROWTH 5 DAYS Performed at [facility]    Report Status 04/12/2015 FINAL  Final  Culture, blood (routine x 2)     Status: None   Collection Time: 04/07/15  2:00 AM  Result Value Ref Range Status   Specimen Description BLOOD LEFT HAND  Final   Special Requests BOTTLES DRAWN AEROBIC AND ANAEROBIC 10CC  Final   Culture   Final    NO GROWTH 5 DAYS Performed at [facility]    Report Status 04/12/2015 FINAL  Final    Radiology Reports Nm Renal Imaging Flow W/pharm  04/08/2015  CLINICAL DATA:  Acute kidney disease. History of left renal atrophy and chronic right hydronephrosis. EXAM: NUCLEAR MEDICINE RENAL SCAN WITH DIURETIC ADMINISTRATION TECHNIQUE: Radionuclide angiographic and sequential renal images were obtained after intravenous injection of radiopharmaceutical. Imaging was continued during slow intravenous injection of Lasix approximately 15 minutes after the start of the examination. RADIOPHARMACEUTICALS:  13.9 mCi Technetium-36m MAG3 IV COMPARISON:  None. FINDINGS: Flow: Decreased perfusion to the smaller left kidney. Normal perfusion to the right kidney. Left renogram: Diminished cortical uptake by the small  left kidney. There is delayed excretion of the radiopharmaceutical. Right renogram: Normal cortical uptake. There is diminished clearance of the radiopharmaceutical from the dilated right renal collecting system. Differential: Left kidney = 28 % Right kidney = 72 % T1/2 post Lasix : Left kidney = not achieved min Right kidney = not achieved. Min IMPRESSION: 1. Diminished left renal function. There is delayed perfusion, cortical uptake and excretion of the radiopharmaceutical by the small left kidney. 2. Delayed and diminished clearance of the radiopharmaceutical from the dilated right renal collecting system compatible with partial obstruction. 3. Split renal function is equal to 72% from the right kidney and 28% from the left kidney. Electronically Signed   By: Kerby Moors M.D.   On: 04/08/2015 16:34   US Renal  04/11/2015  CLINICAL DATA:  Hydronephrosis, poorly functioning right ureteral stent, currently on dialysis, elevated creatinine EXAM: RENAL / URINARY TRACT ULTRASOUND COMPLETE COMPARISON:  04/06/2015 CT FINDINGS: Right Kidney: Length: 11.5 cm. Normal echogenicity and cortical thickness. Ureteral stent extends into the upper pole by ultrasound. Moderate hydronephrosis evident. No focal abnormality. Left Kidney: Length: 9.5 cm. Mild cortical thinning but normal echogenicity. Mild left hydronephrosis and proximal hydroureter. Bladder: Appears normal for degree of bladder distention. IMPRESSION: Right greater than left hydronephrosis despite right ureteral stent insertion Electronically Signed   By: Jerilynn Mages.  Shick M.D.   On: 04/11/2015 13:53   Ir Fluoro Guide Cv Line Right  04/10/2015  CLINICAL DATA:  End-stage renal disease, hypertension, no current access to begin dialysis. EXAM: ULTRASOUND GUIDANCE FOR VASCULAR ACCESS RIGHT INTERNAL JUGULAR PERMANENT HEMODIALYSIS CATHETER Date:  10/21/201610/21/2016 10:12 am Radiologist:  M. Daryll Brod, MD Guidance:  Ultrasound and fluoroscopic FLUOROSCOPY TIME:  30  seconds, 10.7 mGy MEDICATIONS AND MEDICAL HISTORY: 2 g Ancef administered within 1 hour of the procedure, 1 mg Versed, 50 mcg fentanyl ANESTHESIA/SEDATION: 15 minutes CONTRAST:  None. COMPLICATIONS: None immediate PROCEDURE: Informed consent was obtained from the patient following explanation of the procedure, risks, benefits and alternatives. The patient understands, agrees and consents for the procedure. All questions were addressed. A time out was performed. Maximal barrier sterile technique  utilized including caps, mask, sterile gowns, sterile gloves, large sterile drape, hand hygiene, and 2% chlorhexidine scrub. Under sterile conditions and local anesthesia, right internal jugular micropuncture venous access was performed with ultrasound. Images were obtained for documentation. A guide wire was inserted followed by a transitional dilator. Next, a 0.035 guidewire was advanced into the IVC with a 5-French catheter. Measurements were obtained from the right venotomy site to the proximal right atrium. In the right infraclavicular chest, a subcutaneous tunnel was created under sterile conditions and local anesthesia. 1% lidocaine with epinephrine was utilized for this. The 19 cm tip to cuff palindrome catheter was tunneled subcutaneously to the venotomy site and inserted into the SVC/RA junction through a valved peel-away sheath. Position was confirmed with fluoroscopy. Images were obtained for documentation. Blood was aspirated from the catheter followed by saline and heparin flushes. The appropriate volume and strength of heparin was instilled in each lumen. Caps were applied. The catheter was secured at the tunnel site with Gelfoam and a pursestring suture. The venotomy site was closed with subcuticular Vicryl suture. Dermabond was applied to the small right neck incision. A dry sterile dressing was applied. The catheter is ready for use. No immediate complications. IMPRESSION: Ultrasound and fluoroscopically  guided right internal jugular tunneled hemodialysis catheter (Palindrome catheter). Electronically Signed   By: Jerilynn Mages.  Shick M.D.   On: 04/10/2015 11:03   Ir US Guide Vasc Access Right  04/10/2015  CLINICAL DATA:  End-stage renal disease, hypertension, no current access to begin dialysis. EXAM: ULTRASOUND GUIDANCE FOR VASCULAR ACCESS RIGHT INTERNAL JUGULAR PERMANENT HEMODIALYSIS CATHETER Date:  10/21/201610/21/2016 10:12 am Radiologist:  M. Daryll Brod, MD Guidance:  Ultrasound and fluoroscopic FLUOROSCOPY TIME:  30 seconds, 10.7 mGy MEDICATIONS AND MEDICAL HISTORY: 2 g Ancef administered within 1 hour of the procedure, 1 mg Versed, 50 mcg fentanyl ANESTHESIA/SEDATION: 15 minutes CONTRAST:  None. COMPLICATIONS: None immediate PROCEDURE: Informed consent was obtained from the patient following explanation of the procedure, risks, benefits and alternatives. The patient understands, agrees and consents for the procedure. All questions were addressed. A time out was performed. Maximal barrier sterile technique utilized including caps, mask, sterile gowns, sterile gloves, large sterile drape, hand hygiene, and 2% chlorhexidine scrub. Under sterile conditions and local anesthesia, right internal jugular micropuncture venous access was performed with ultrasound. Images were obtained for documentation. A guide wire was inserted followed by a transitional dilator. Next, a 0.035 guidewire was advanced into the IVC with a 5-French catheter. Measurements were obtained from the right venotomy site to the proximal right atrium. In the right infraclavicular chest, a subcutaneous tunnel was created under sterile conditions and local anesthesia. 1% lidocaine with epinephrine was utilized for this. The 19 cm tip to cuff palindrome catheter was tunneled subcutaneously to the venotomy site and inserted into the SVC/RA junction through a valved peel-away sheath. Position was confirmed with fluoroscopy. Images were obtained for  documentation. Blood was aspirated from the catheter followed by saline and heparin flushes. The appropriate volume and strength of heparin was instilled in each lumen. Caps were applied. The catheter was secured at the tunnel site with Gelfoam and a pursestring suture. The venotomy site was closed with subcuticular Vicryl suture. Dermabond was applied to the small right neck incision. A dry sterile dressing was applied. The catheter is ready for use. No immediate complications. IMPRESSION: Ultrasound and fluoroscopically guided right internal jugular tunneled hemodialysis catheter (Palindrome catheter). Electronically Signed   By: Jerilynn Mages.  Shick M.D.   On: 04/10/2015 11:03  Ct Renal Stone Study  04/06/2015  CLINICAL DATA:  Acute onset of renal failure. Nausea. Recent placement of right ureteral stent for obstructing ureteral stone. Initial encounter. EXAM: CT ABDOMEN AND PELVIS WITHOUT CONTRAST TECHNIQUE: Multidetector CT imaging of the abdomen and pelvis was performed following the standard protocol without IV contrast. COMPARISON:  CT of the abdomen and pelvis performed 02/20/2015, and renal ultrasound performed 02/27/2015 FINDINGS: The visualized lung bases are clear. A large hiatal hernia is noted. Mild calcification is noted at the mitral valve. The liver and spleen are unremarkable in appearance. The patient is status post cholecystectomy, with clips noted at the gallbladder fossa. The pancreas and adrenal glands are unremarkable. There is mild right-sided and minimal left-sided hydronephrosis, with diffuse wall thickening at the renal calyces and pelves, extending along both ureters, and mild perinephric stranding. Findings are concerning for pyelonephritis and ureteritis. A right ureteral stent is noted in expected position. No obstructing ureteral stones are seen. There is wall thickening about the bladder, with associated soft tissue inflammation, reflecting underlying cystitis. No free fluid is  identified. The small bowel is unremarkable in appearance. The stomach is within normal limits. No acute vascular abnormalities are seen. The appendix is normal in caliber, without evidence of appendicitis. Mild scattered diverticulosis is noted along the transverse, descending and proximal sigmoid colon, without evidence of diverticulitis. There is a relatively broad-based moderate anterior abdominal wall hernia extending into the pannus, containing only fat. A small periumbilical hernia is also noted, to the right of the umbilicus, containing only fat. The patient is status post hysterectomy. No suspicious adnexal masses are seen. No inguinal lymphadenopathy is seen. No acute osseous abnormalities are identified. There is grade 1 retrolisthesis of L2 on L3, with associated vacuum phenomenon and endplate sclerotic change, and minimal grade 1 retrolisthesis of L3 on L4. IMPRESSION: 1. Mild right-sided and minimal left-sided hydronephrosis, with diffuse wall thickening at the renal calyces and pelves, extending along both ureters, and mild perinephric stranding. Findings concerning for bilateral pyelonephritis and ureteritis, somewhat more prominent than in September. Right ureteral stent noted in expected position. No obstructing ureteral stone seen. 2. Diffuse bladder wall thickening, with associated soft tissue inflammation, concerning for underlying cystitis. 3. Large hiatal hernia noted. 4. Mild scattered diverticulosis along the transverse, descending and proximal sigmoid colon, without evidence of diverticulitis. 5. Relatively broad-based moderate anterior abdominal wall hernia extending into the pannus, containing only fat. Small periumbilical hernia to the right of the umbilicus, also containing only fat. 6. Mild degenerative change at the mid lumbar spine. Electronically Signed   By: Garald Balding M.D.   On: 04/06/2015 22:21   Ir Nephrostomy Placement Right  04/11/2015  CLINICAL DATA:  PERSISTENT RIGHT  HYDRONEPHROSIS DESPITE URETERAL STENT INSERTION. RENAL FAILURE, ELEVATED CREATININE. EXAM: ULTRASOUND FLUOROSCOPIC 10 FRENCH RIGHT NEPHROSTOMY INSERTION Date:  10/22/201610/22/2016 2:23 pm Radiologist:  M. Daryll Brod, MD Guidance:  Ultrasound and fluoroscopic FLUOROSCOPY TIME:  1 minutes 12 seconds, 60 mGy MEDICATIONS AND MEDICAL HISTORY: Patient is already receiving 1 g Rocephin every 24 hours. 1.5 mg Versed, 75 mcg fentanyl ANESTHESIA/SEDATION: 10 minutes CONTRAST:  55mL OMNIPAQUE IOHEXOL 300 MG/ML  SOLN COMPLICATIONS: None immediate PROCEDURE: Informed consent was obtained from the patient following explanation of the procedure, risks, benefits and alternatives. The patient understands, agrees and consents for the procedure. All questions were addressed. A time out was performed. Maximal barrier sterile technique utilized including caps, mask, sterile gowns, sterile gloves, large sterile drape, hand hygiene, and ChloraPrep. Under sterile conditions  and local anesthesia, ultrasound micropuncture needle access performed of a right mid to lower pole dilated calyx. Needle position confirmed with ultrasound. Images obtained for documentation. There was return of cloudy exudative urine. Guidewire advanced easily into the renal pelvis. Accustick dilator set advanced. Amplatz guidewire exchange performed. Tract dilatation performed to advance a 10 French nephrostomy with the retention loop formed in the renal pelvis. Contrast injection confirms position. Gravity drainage bag connected. Catheter secured with a Prolene suture and a sterile dressing. No immediate complication. Patient tolerated the procedure well. IMPRESSION: Successful ultrasound and fluoroscopic 10 French right nephrostomy insertion. Electronically Signed   By: Jerilynn Mages.  Shick M.D.   On: 04/11/2015 14:33    CBC  Recent Labs Lab 04/06/15 2108  04/08/15 0915 04/09/15 0525 04/11/15 0343 04/11/15 1450 04/12/15 0327 04/13/15 0451  WBC 11.2*  < > 6.9  10.6* 7.3 6.9 7.2 6.5  HGB 9.1*  < > 7.3* 8.1* 7.4* 8.1* 7.1* 6.9*  HCT 29.5*  < > 23.5* 26.2* 24.2* 27.4* 23.7* 23.2*  PLT 493*  < > 350 392 321 367 294 266  MCV 87.3  < > 86.7 85.9 88.3 89.0 88.8 89.6  MCH 26.9  < > 26.9 26.6 27.0 26.3 26.6 26.3  MCHC 30.8  < > 31.1 30.9 30.6 29.6* 30.0 29.3*  RDW 16.6*  < > 16.9* 16.7* 16.8* 16.6* 16.6* 16.6*  LYMPHSABS 1.6  --  1.0  --   --   --   --   --   MONOABS 0.6  --  0.4  --   --   --   --   --   EOSABS 0.3  --  0.3  --   --   --   --   --   BASOSABS 0.1  --  0.0  --   --   --   --   --   < > = values in this interval not displayed.  Chemistries   Recent Labs Lab 04/06/15 2108  04/10/15 1935 04/11/15 0343 04/11/15 1450 04/12/15 0327 04/13/15 0451  NA 135  < > 138 135 134* 133* 137  K 4.3  < > 3.2* 3.6 3.3* 4.4 3.7  CL 106  < > 90* 93* 89* 91* 90*  CO2 20*  < > 35* 34* 35* 34* 38*  GLUCOSE 119*  < > 106* 113* 112* 108* 113*  BUN 86*  < > 49* 18 19 22* 22*  CREATININE 8.02*  < > 5.79* 3.49* 4.09* 4.26* 4.44*  CALCIUM 9.6  < > 8.5* 7.8* 8.3* 7.8* 8.5*  AST 17  --   --   --   --   --   --   ALT 15  --   --   --   --   --   --   ALKPHOS 76  --   --   --   --   --   --   BILITOT 0.5  --   --   --   --   --   --   < > = values in this interval not displayed. ------------------------------------------------------------------------------------------------------------------ estimated creatinine clearance is 12.1 mL/min (by C-G formula based on Cr of 4.44). ------------------------------------------------------------------------------------------------------------------ No results for input(s): HGBA1C in the last 72 hours. ------------------------------------------------------------------------------------------------------------------ No results for input(s): CHOL, HDL, LDLCALC, TRIG, CHOLHDL, LDLDIRECT in the last 72  hours. ------------------------------------------------------------------------------------------------------------------ No results for input(s): TSH, T4TOTAL, T3FREE, THYROIDAB in the last 72 hours.  Invalid input(s): FREET3 ------------------------------------------------------------------------------------------------------------------ No results  for input(s): VITAMINB12, FOLATE, FERRITIN, TIBC, IRON, RETICCTPCT in the last 72 hours.  Coagulation profile No results for input(s): INR, PROTIME in the last 168 hours.  No results for input(s): DDIMER in the last 72 hours.  Cardiac Enzymes No results for input(s): CKMB, TROPONINI, MYOGLOBIN in the last 168 hours.  Invalid input(s): CK ------------------------------------------------------------------------------------------------------------------ Invalid input(s): POCBNP    Ed Mandich D.O. on 04/13/2015 at 10:23 AM  Between 7am to 7pm - Pager - [phone]  After 7pm go to [email or web address] - password [email or web address]  And look for the night coverage person covering for me after hours  [facility] Office  [phone]

## 2015-04-14 DIAGNOSIS — R519 Headache, unspecified: Secondary | ICD-10-CM | POA: Insufficient documentation

## 2015-04-14 DIAGNOSIS — R51 Headache: Secondary | ICD-10-CM

## 2015-04-14 LAB — RENAL FUNCTION PANEL
Albumin: 1.9 g/dL — ABNORMAL LOW (ref 3.5–5.0)
Anion gap: 9 (ref 5–15)
BUN: 22 mg/dL — ABNORMAL HIGH (ref 6–20)
CO2: 35 mmol/L — ABNORMAL HIGH (ref 22–32)
Calcium: 8.8 mg/dL — ABNORMAL LOW (ref 8.9–10.3)
Chloride: 88 mmol/L — ABNORMAL LOW (ref 101–111)
Creatinine, Ser: 4.82 mg/dL — ABNORMAL HIGH (ref 0.44–1.00)
GFR calc Af Amer: 10 mL/min — ABNORMAL LOW (ref >60)
GFR calc non Af Amer: 8 mL/min — ABNORMAL LOW (ref >60)
Glucose, Bld: 105 mg/dL — ABNORMAL HIGH (ref 65–99)
Phosphorus: 3 mg/dL (ref 2.5–4.6)
Potassium: 3.8 mmol/L (ref 3.5–5.1)
Sodium: 132 mmol/L — ABNORMAL LOW (ref 135–145)

## 2015-04-14 LAB — TYPE AND SCREEN
ABO/RH(D): O POS
Antibody Screen: NEGATIVE
Unit division: 0

## 2015-04-14 LAB — CBC
HCT: 26.3 % — ABNORMAL LOW (ref 36.0–46.0)
Hemoglobin: 8 g/dL — ABNORMAL LOW (ref 12.0–15.0)
MCH: 26.7 pg (ref 26.0–34.0)
MCHC: 30.4 g/dL (ref 30.0–36.0)
MCV: 87.7 fL (ref 78.0–100.0)
Platelets: 252 10*3/uL (ref 150–400)
RBC: 3 MIL/uL — ABNORMAL LOW (ref 3.87–5.11)
RDW: 16.2 % — ABNORMAL HIGH (ref 11.5–15.5)
WBC: 7.4 10*3/uL (ref 4.0–10.5)

## 2015-04-14 MED ORDER — DARBEPOETIN ALFA 100 MCG/0.5ML IJ SOSY
100.0000 ug | PREFILLED_SYRINGE | INTRAMUSCULAR | Status: DC
  Administered 2015-04-14: 100 ug via SUBCUTANEOUS
  Filled 2015-04-14 (×2): qty 0.5

## 2015-04-14 MED ORDER — CEFAZOLIN SODIUM-DEXTROSE 2-3 GM-% IV SOLR: 2.0000 g | INTRAVENOUS | Status: AC

## 2015-04-14 MED ORDER — FAMOTIDINE 20 MG PO TABS
10.0000 mg | ORAL_TABLET | Freq: Two times a day (BID) | ORAL | Status: DC
  Administered 2015-04-14 – 2015-04-16 (×5): 10 mg via ORAL
  Filled 2015-04-14 (×5): qty 1

## 2015-04-14 NOTE — Progress Notes (Signed)
Patient ID: [name], female   DOB: Nov 08, 1945, 69 y.o.   MRN: [id number]          Subjective: Pt without new c/o; denies sig rt flank pain, N/V or hematuria. Allergies: Vicodin  Medications: Prior to Admission medications   Medication Sig Start Date End Date Taking? Authorizing Provider  acetaminophen (TYLENOL) 500 MG tablet Take 500 mg by mouth every 6 (six) hours as needed for moderate pain.   Yes Historical Provider, MD  bimatoprost (LUMIGAN) 0.01 % SOLN Place 1 drop into both eyes at bedtime.   Yes Historical Provider, MD  metoprolol tartrate (LOPRESSOR) 25 MG tablet Take 1 tablet (25 mg total) by mouth 2 (two) times daily. Patient taking differently: Take 25 mg by mouth daily.  11/21/14  Yes Jettie Booze, MD  ondansetron (ZOFRAN ODT) 4 MG disintegrating tablet Take 1 tablet (4 mg total) by mouth every 8 (eight) hours as needed for nausea or vomiting. 11/27/14  Yes Velvet Bathe, MD  ranitidine (ZANTAC) 150 MG tablet Take 150 mg by mouth 2 (two) times daily as needed for heartburn.   Yes Historical Provider, MD  timolol (BETIMOL) 0.5 % ophthalmic solution Place 1 drop into both eyes every morning.    Yes Historical Provider, MD  amLODipine (NORVASC) 5 MG tablet Take 1 tablet (5 mg total) by mouth every evening. Patient not taking: Reported on 02/25/2015 11/21/14   Jettie Booze, MD  cephALEXin (KEFLEX) 500 MG capsule Take 1 capsule (500 mg total) by mouth 2 (two) times daily. Patient not taking: Reported on 04/06/2015 03/01/15   Oswald Hillock, MD     Vital Signs: BP 118/58 mmHg  Pulse 85  Temp(Src) 98.6 F (37 C) (Oral)  Resp 18  Ht 5\' 3"  (1.6 m)  Wt 184 lb 11.9 oz (83.8 kg)  BMI 32.73 kg/m2  SpO2 94%  LMP 01/19/1992  Physical Exam rt PCN intact, insertion site ok, minimal tenderness, output 1.8 liters slightly turbid, light yellow urine  Imaging: US Renal  04/11/2015  CLINICAL DATA:  Hydronephrosis, poorly functioning right ureteral stent, currently on  dialysis, elevated creatinine EXAM: RENAL / URINARY TRACT ULTRASOUND COMPLETE COMPARISON:  04/06/2015 CT FINDINGS: Right Kidney: Length: 11.5 cm. Normal echogenicity and cortical thickness. Ureteral stent extends into the upper pole by ultrasound. Moderate hydronephrosis evident. No focal abnormality. Left Kidney: Length: 9.5 cm. Mild cortical thinning but normal echogenicity. Mild left hydronephrosis and proximal hydroureter. Bladder: Appears normal for degree of bladder distention. IMPRESSION: Right greater than left hydronephrosis despite right ureteral stent insertion Electronically Signed   By: Jerilynn Mages.  Shick M.D.   On: 04/11/2015 13:53   Ir Fluoro Guide Cv Line Right  04/10/2015  CLINICAL DATA:  End-stage renal disease, hypertension, no current access to begin dialysis. EXAM: ULTRASOUND GUIDANCE FOR VASCULAR ACCESS RIGHT INTERNAL JUGULAR PERMANENT HEMODIALYSIS CATHETER Date:  10/21/201610/21/2016 10:12 am Radiologist:  M. Daryll Brod, MD Guidance:  Ultrasound and fluoroscopic FLUOROSCOPY TIME:  30 seconds, 10.7 mGy MEDICATIONS AND MEDICAL HISTORY: 2 g Ancef administered within 1 hour of the procedure, 1 mg Versed, 50 mcg fentanyl ANESTHESIA/SEDATION: 15 minutes CONTRAST:  None. COMPLICATIONS: None immediate PROCEDURE: Informed consent was obtained from the patient following explanation of the procedure, risks, benefits and alternatives. The patient understands, agrees and consents for the procedure. All questions were addressed. A time out was performed. Maximal barrier sterile technique utilized including caps, mask, sterile gowns, sterile gloves, large sterile drape, hand hygiene, and 2% chlorhexidine scrub. Under sterile conditions and  local anesthesia, right internal jugular micropuncture venous access was performed with ultrasound. Images were obtained for documentation. A guide wire was inserted followed by a transitional dilator. Next, a 0.035 guidewire was advanced into the IVC with a 5-French  catheter. Measurements were obtained from the right venotomy site to the proximal right atrium. In the right infraclavicular chest, a subcutaneous tunnel was created under sterile conditions and local anesthesia. 1% lidocaine with epinephrine was utilized for this. The 19 cm tip to cuff palindrome catheter was tunneled subcutaneously to the venotomy site and inserted into the SVC/RA junction through a valved peel-away sheath. Position was confirmed with fluoroscopy. Images were obtained for documentation. Blood was aspirated from the catheter followed by saline and heparin flushes. The appropriate volume and strength of heparin was instilled in each lumen. Caps were applied. The catheter was secured at the tunnel site with Gelfoam and a pursestring suture. The venotomy site was closed with subcuticular Vicryl suture. Dermabond was applied to the small right neck incision. A dry sterile dressing was applied. The catheter is ready for use. No immediate complications. IMPRESSION: Ultrasound and fluoroscopically guided right internal jugular tunneled hemodialysis catheter (Palindrome catheter). Electronically Signed   By: Jerilynn Mages.  Shick M.D.   On: 04/10/2015 11:03   Ir US Guide Vasc Access Right  04/10/2015  CLINICAL DATA:  End-stage renal disease, hypertension, no current access to begin dialysis. EXAM: ULTRASOUND GUIDANCE FOR VASCULAR ACCESS RIGHT INTERNAL JUGULAR PERMANENT HEMODIALYSIS CATHETER Date:  10/21/201610/21/2016 10:12 am Radiologist:  M. Daryll Brod, MD Guidance:  Ultrasound and fluoroscopic FLUOROSCOPY TIME:  30 seconds, 10.7 mGy MEDICATIONS AND MEDICAL HISTORY: 2 g Ancef administered within 1 hour of the procedure, 1 mg Versed, 50 mcg fentanyl ANESTHESIA/SEDATION: 15 minutes CONTRAST:  None. COMPLICATIONS: None immediate PROCEDURE: Informed consent was obtained from the patient following explanation of the procedure, risks, benefits and alternatives. The patient understands, agrees and consents for the  procedure. All questions were addressed. A time out was performed. Maximal barrier sterile technique utilized including caps, mask, sterile gowns, sterile gloves, large sterile drape, hand hygiene, and 2% chlorhexidine scrub. Under sterile conditions and local anesthesia, right internal jugular micropuncture venous access was performed with ultrasound. Images were obtained for documentation. A guide wire was inserted followed by a transitional dilator. Next, a 0.035 guidewire was advanced into the IVC with a 5-French catheter. Measurements were obtained from the right venotomy site to the proximal right atrium. In the right infraclavicular chest, a subcutaneous tunnel was created under sterile conditions and local anesthesia. 1% lidocaine with epinephrine was utilized for this. The 19 cm tip to cuff palindrome catheter was tunneled subcutaneously to the venotomy site and inserted into the SVC/RA junction through a valved peel-away sheath. Position was confirmed with fluoroscopy. Images were obtained for documentation. Blood was aspirated from the catheter followed by saline and heparin flushes. The appropriate volume and strength of heparin was instilled in each lumen. Caps were applied. The catheter was secured at the tunnel site with Gelfoam and a pursestring suture. The venotomy site was closed with subcuticular Vicryl suture. Dermabond was applied to the small right neck incision. A dry sterile dressing was applied. The catheter is ready for use. No immediate complications. IMPRESSION: Ultrasound and fluoroscopically guided right internal jugular tunneled hemodialysis catheter (Palindrome catheter). Electronically Signed   By: Jerilynn Mages.  Shick M.D.   On: 04/10/2015 11:03   Ir Nephrostomy Placement Right  04/11/2015  CLINICAL DATA:  PERSISTENT RIGHT HYDRONEPHROSIS DESPITE URETERAL STENT INSERTION. RENAL FAILURE, ELEVATED  CREATININE. EXAM: ULTRASOUND FLUOROSCOPIC 10 FRENCH RIGHT NEPHROSTOMY INSERTION Date:   10/22/201610/22/2016 2:23 pm Radiologist:  M. Daryll Brod, MD Guidance:  Ultrasound and fluoroscopic FLUOROSCOPY TIME:  1 minutes 12 seconds, 60 mGy MEDICATIONS AND MEDICAL HISTORY: Patient is already receiving 1 g Rocephin every 24 hours. 1.5 mg Versed, 75 mcg fentanyl ANESTHESIA/SEDATION: 10 minutes CONTRAST:  76mL OMNIPAQUE IOHEXOL 300 MG/ML  SOLN COMPLICATIONS: None immediate PROCEDURE: Informed consent was obtained from the patient following explanation of the procedure, risks, benefits and alternatives. The patient understands, agrees and consents for the procedure. All questions were addressed. A time out was performed. Maximal barrier sterile technique utilized including caps, mask, sterile gowns, sterile gloves, large sterile drape, hand hygiene, and ChloraPrep. Under sterile conditions and local anesthesia, ultrasound micropuncture needle access performed of a right mid to lower pole dilated calyx. Needle position confirmed with ultrasound. Images obtained for documentation. There was return of cloudy exudative urine. Guidewire advanced easily into the renal pelvis. Accustick dilator set advanced. Amplatz guidewire exchange performed. Tract dilatation performed to advance a 10 French nephrostomy with the retention loop formed in the renal pelvis. Contrast injection confirms position. Gravity drainage bag connected. Catheter secured with a Prolene suture and a sterile dressing. No immediate complication. Patient tolerated the procedure well. IMPRESSION: Successful ultrasound and fluoroscopic 10 French right nephrostomy insertion. Electronically Signed   By: Jerilynn Mages.  Shick M.D.   On: 04/11/2015 14:33    Labs:  CBC:  Recent Labs  04/11/15 1450 04/12/15 0327 04/13/15 0451 04/14/15 0729  WBC 6.9 7.2 6.5 7.4  HGB 8.1* 7.1* 6.9* 8.0*  HCT 27.4* 23.7* 23.2* 26.3*  PLT 367 294 266 252    COAGS:  Recent Labs  11/23/14 0305 02/26/15 0015  INR 1.31 1.17  APTT  --  33    BMP:  Recent Labs   04/11/15 1450 04/12/15 0327 04/13/15 0451 04/14/15 0729  NA 134* 133* 137 132*  K 3.3* 4.4 3.7 3.8  CL 89* 91* 90* 88*  CO2 35* 34* 38* 35*  GLUCOSE 112* 108* 113* 105*  BUN 19 22* 22* 22*  CALCIUM 8.3* 7.8* 8.5* 8.8*  CREATININE 4.09* 4.26* 4.44* 4.82*  GFRNONAA 10* 10* 9* 8*  GFRAA 12* 11* 11* 10*    LIVER FUNCTION TESTS:  Recent Labs  11/22/14 1945 11/23/14 0305 02/25/15 1930 04/06/15 2108  04/11/15 1450 04/12/15 0327 04/13/15 0451 04/14/15 0729  BILITOT 0.5 0.3 0.4 0.5  --   --   --   --   --   AST 22 16 18 17   --   --   --   --   --   ALT 27 20 12* 15  --   --   --   --   --   ALKPHOS 166* 133* 82 76  --   --   --   --   --   PROT 6.4* 5.4* 7.7 7.2  --   --   --   --   --   ALBUMIN 2.4* 1.9* 3.6 3.1*  < > 2.5* 2.0* 1.9* 1.9*  < > = values in this interval not displayed.  Assessment and Plan: S/p rt PCN 10/22 secondary to ureteral obst despite stent, renal failure; currently afebrile; WBC nl, hgb 8.0, creat up some to 4.82 (4.44); encourage hydration; other plans as per nephrology/urology   Signed: D. Lennette Bihari Allred 04/14/2015, 10:03 AM   I spent a total of 15 minutes at the the patient's bedside AND on the  patient's hospital floor or unit, greater than 50% of which was counseling/coordinating care for right perc nephrostomy

## 2015-04-14 NOTE — Progress Notes (Signed)
[facility]                                                                              Patient Demographics  [name], is a 69 y.o. female, DOB - 1945-08-15, XT:[id number]  Admit date - 04/06/2015   Admitting Physician Waldemar Dickens, MD  Outpatient Primary MD for the patient is [name], MD  LOS - 8   No chief complaint on file.     HPI on 04/07/2015 by Dr. Linna Darner [name] is a 69 y.o. female  Patient was seen today by her primary care physician who drew routine labs admitting: The patient to report elevated creatinine and ask her to go to the emergency room for further evaluation. With the exception of one-week history of intermittent nausea, dysuria, and left lower back pain patient states she's been in her normal state of health and had no inkling of any underlying illness or recent echo the emergency room. Patient has not done anything for her symptoms. Symptoms are not better or worsens onset. Patient had a right ureteral stent placed on 02/26/2015. She states she's had no palpitations procedure. She sees Dr. Florene Glen is her nephrologist as outpatient.  Interim history Patient felt to be uremic for 2 months and requires HD.  Transferred from [facility] to [facility] and HD started on 10/21.  Assessment & Plan   Acute on chronic kidney disease, Stage III/IV with uremia -Upon admission, Cr 8.02. had HD 10/21 -Cr 4.82 today (slightly increased from yesterday) -Nephrology consulted and appreciated -IR placed HD cath and right nephrostomy -UOP 2125 over past 24hrs -Continue to monitor creatinine, would like to see improvement   Pyelonephritis/UTI -CT abdomen and pelvis noted perinephric stranding -Patient has had some back pain currently afebrile, WBCs normalized. -urine culture showed multiple species present -Patient received 7 days of Rocephin -Blood cultures show no growth to date   Nephroureterolithiasis/right hydronephrosis -Patient did have  stent placed by Dr. Gaynelle Arabian approximately one month ago -Patient had right perc placed by IR 10/22  Nausea -Likely secondary to uremia- has improved -Continue antiemetics as needed   Anemia of chronic disease -Hemoglobin currently 6.9 -dose of feraheme given and unit PRBC 04/13/2015 -started on aranesp today by nephrology  Hypertension -Stable, metoprolol and amlodipine held   Headache (frontal) -Improving, Continue pain control and saline nasal spray  Hypokalemia -Resolved, continue to monitor BMP  Code Status: Full  Family Communication: Daughter at bedside  Disposition Plan: Admitted. Continue to monitor UOP   Time Spent in minutes   30 minutes  Procedures  Renal Scan IR placement of RIJ HD cath IR Insertion of right PCN  Consults   Nephrology Interventional radiology Vascular surgery Urology  DVT Prophylaxis  heparin  Lab Results  Component Value Date   PLT 252 04/14/2015    Medications  Scheduled Meds: . sodium chloride   Intravenous Once  .  ceFAZolin (ANCEF) IV  2 g Intravenous to XRAY  . darbepoetin (ARANESP) injection - NON-DIALYSIS  100 mcg Subcutaneous Q Tue-1800  . famotidine  10 mg Oral BID AC  . feeding supplement (NEPRO CARB STEADY)  237 mL Oral BID BM  . heparin  5,000 Units Subcutaneous 3 times per day  . latanoprost  1 drop Both Eyes QHS  . saccharomyces boulardii  250 mg Oral BID  . timolol  1 drop Both Eyes q morning - 10a   Continuous Infusions:   PRN Meds:.sodium chloride, sodium chloride, acetaminophen **OR** acetaminophen, acetaminophen **OR** acetaminophen, alteplase, butalbital-acetaminophen-caffeine, calcium carbonate (dosed in mg elemental calcium), camphor-menthol **AND** hydrOXYzine, docusate sodium, heparin, heparin, iohexol, lidocaine (PF), lidocaine-prilocaine, ondansetron **OR** ondansetron (ZOFRAN) IV, ondansetron **OR** ondansetron (ZOFRAN) IV, oxyCODONE, pentafluoroprop-tetrafluoroeth, prochlorperazine, sodium  chloride, sorbitol, zolpidem  Antibiotics    Anti-infectives    Start     Dose/Rate Route Frequency Ordered Stop   04/14/15 1145  ceFAZolin (ANCEF) IVPB 2 g/50 mL premix     2 g 100 mL/hr over 30 Minutes Intravenous To Radiology 04/14/15 1142 04/15/15 1145   04/11/15 1500  cefTRIAXone (ROCEPHIN) 1 g in dextrose 5 % 50 mL IVPB  Status:  Discontinued     1 g 100 mL/hr over 30 Minutes Intravenous Every 24 hours 04/11/15 1417 04/13/15 1029   04/10/15 0943  ceFAZolin (ANCEF) 2-3 GM-% IVPB SOLR    Comments:  Soyla Dryer   : cabinet override      04/10/15 0943 04/10/15 2159   04/07/15 2200  cefTRIAXone (ROCEPHIN) 1 g in dextrose 5 % 50 mL IVPB  Status:  Discontinued     1 g 100 mL/hr over 30 Minutes Intravenous Every 24 hours 04/07/15 0042 04/11/15 1417   04/06/15 2230  cefTRIAXone (ROCEPHIN) 1 g in dextrose 5 % 50 mL IVPB     1 g 100 mL/hr over 30 Minutes Intravenous  Once 04/06/15 2221 04/06/15 2312        Subjective:   [name] seen and examined today.  Patient has no complaints today and states she is feeling much better. States she is trying to drinking more fluids and eat more. She denies nausea or headache.  Denies chest pain, shortness of breath, nausea, vomiting, change in bowel pattern.   Objective:   Filed Vitals:   04/13/15 1919 04/13/15 2100 04/14/15 0500 04/14/15 1100  BP: 94/42 119/60 118/58 106/55  Pulse: 101 85 85 92  Temp: 99 F (37.2 C) 98.5 F (36.9 C) 98.6 F (37 C) 98 F (36.7 C)  TempSrc: Oral Oral Oral Oral  Resp: 20 18 18 18   Height:      Weight:  83.8 kg (184 lb 11.9 oz)    SpO2: 96% 99% 94% 96%    Wt Readings from Last 3 Encounters:  04/13/15 83.8 kg (184 lb 11.9 oz)  03/01/15 90.357 kg (199 lb 3.2 oz)  11/24/14 100.2 kg (220 lb 14.4 oz)     Intake/Output Summary (Last 24 hours) at 04/14/15 1212 Last data filed at 04/14/15 [id number]  Gross per 24 hour  Intake    385 ml  Output   1625 ml  Net  -1240 ml    Exam  General: Well  developed, well nourished, NAD  HEENT: NCAT,  mucous membranes moist.   Cardiovascular: S1 S2 auscultated, RRR, no murmurs  Respiratory: Clear to auscultation bilaterally   Abdomen: Soft, nontender, nondistended, + bowel sounds, catheter noted right side  Extremities: warm dry without cyanosis clubbing or edema  Neuro: AAOx3, nonfocal  Psych: Normal affect and demeanor, pleasant  Data Review   Micro Results Recent Results (from the past 240 hour(s))  Urine culture     Status: None   Collection Time: 04/06/15  9:46 PM  Result Value  Ref Range Status   Specimen Description URINE, CLEAN CATCH  Final   Special Requests NONE  Final   Culture   Final    MULTIPLE SPECIES PRESENT, SUGGEST RECOLLECTION Performed at [facility]    Report Status 04/08/2015 FINAL  Final  Culture, blood (routine x 2)     Status: None   Collection Time: 04/07/15  1:55 AM  Result Value Ref Range Status   Specimen Description BLOOD LEFT ARM  Final   Special Requests BOTTLES DRAWN AEROBIC AND ANAEROBIC 10CC  Final   Culture   Final    NO GROWTH 5 DAYS Performed at [facility]    Report Status 04/12/2015 FINAL  Final  Culture, blood (routine x 2)     Status: None   Collection Time: 04/07/15  2:00 AM  Result Value Ref Range Status   Specimen Description BLOOD LEFT HAND  Final   Special Requests BOTTLES DRAWN AEROBIC AND ANAEROBIC 10CC  Final   Culture   Final    NO GROWTH 5 DAYS Performed at [facility]    Report Status 04/12/2015 FINAL  Final    Radiology Reports Nm Renal Imaging Flow W/pharm  04/08/2015  CLINICAL DATA:  Acute kidney disease. History of left renal atrophy and chronic right hydronephrosis. EXAM: NUCLEAR MEDICINE RENAL SCAN WITH DIURETIC ADMINISTRATION TECHNIQUE: Radionuclide angiographic and sequential renal images were obtained after intravenous injection of radiopharmaceutical. Imaging was continued during slow intravenous injection of Lasix approximately  15 minutes after the start of the examination. RADIOPHARMACEUTICALS:  13.9 mCi Technetium-31m MAG3 IV COMPARISON:  None. FINDINGS: Flow: Decreased perfusion to the smaller left kidney. Normal perfusion to the right kidney. Left renogram: Diminished cortical uptake by the small left kidney. There is delayed excretion of the radiopharmaceutical. Right renogram: Normal cortical uptake. There is diminished clearance of the radiopharmaceutical from the dilated right renal collecting system. Differential: Left kidney = 28 % Right kidney = 72 % T1/2 post Lasix : Left kidney = not achieved min Right kidney = not achieved. Min IMPRESSION: 1. Diminished left renal function. There is delayed perfusion, cortical uptake and excretion of the radiopharmaceutical by the small left kidney. 2. Delayed and diminished clearance of the radiopharmaceutical from the dilated right renal collecting system compatible with partial obstruction. 3. Split renal function is equal to 72% from the right kidney and 28% from the left kidney. Electronically Signed   By: Kerby Moors M.D.   On: 04/08/2015 16:34   US Renal  04/11/2015  CLINICAL DATA:  Hydronephrosis, poorly functioning right ureteral stent, currently on dialysis, elevated creatinine EXAM: RENAL / URINARY TRACT ULTRASOUND COMPLETE COMPARISON:  04/06/2015 CT FINDINGS: Right Kidney: Length: 11.5 cm. Normal echogenicity and cortical thickness. Ureteral stent extends into the upper pole by ultrasound. Moderate hydronephrosis evident. No focal abnormality. Left Kidney: Length: 9.5 cm. Mild cortical thinning but normal echogenicity. Mild left hydronephrosis and proximal hydroureter. Bladder: Appears normal for degree of bladder distention. IMPRESSION: Right greater than left hydronephrosis despite right ureteral stent insertion Electronically Signed   By: Jerilynn Mages.  Shick M.D.   On: 04/11/2015 13:53   Ir Fluoro Guide Cv Line Right  04/10/2015  CLINICAL DATA:  End-stage renal disease,  hypertension, no current access to begin dialysis. EXAM: ULTRASOUND GUIDANCE FOR VASCULAR ACCESS RIGHT INTERNAL JUGULAR PERMANENT HEMODIALYSIS CATHETER Date:  10/21/201610/21/2016 10:12 am Radiologist:  M. Daryll Brod, MD Guidance:  Ultrasound and fluoroscopic FLUOROSCOPY TIME:  30 seconds, 10.7 mGy MEDICATIONS AND MEDICAL HISTORY: 2 g Ancef  administered within 1 hour of the procedure, 1 mg Versed, 50 mcg fentanyl ANESTHESIA/SEDATION: 15 minutes CONTRAST:  None. COMPLICATIONS: None immediate PROCEDURE: Informed consent was obtained from the patient following explanation of the procedure, risks, benefits and alternatives. The patient understands, agrees and consents for the procedure. All questions were addressed. A time out was performed. Maximal barrier sterile technique utilized including caps, mask, sterile gowns, sterile gloves, large sterile drape, hand hygiene, and 2% chlorhexidine scrub. Under sterile conditions and local anesthesia, right internal jugular micropuncture venous access was performed with ultrasound. Images were obtained for documentation. A guide wire was inserted followed by a transitional dilator. Next, a 0.035 guidewire was advanced into the IVC with a 5-French catheter. Measurements were obtained from the right venotomy site to the proximal right atrium. In the right infraclavicular chest, a subcutaneous tunnel was created under sterile conditions and local anesthesia. 1% lidocaine with epinephrine was utilized for this. The 19 cm tip to cuff palindrome catheter was tunneled subcutaneously to the venotomy site and inserted into the SVC/RA junction through a valved peel-away sheath. Position was confirmed with fluoroscopy. Images were obtained for documentation. Blood was aspirated from the catheter followed by saline and heparin flushes. The appropriate volume and strength of heparin was instilled in each lumen. Caps were applied. The catheter was secured at the tunnel site with Gelfoam  and a pursestring suture. The venotomy site was closed with subcuticular Vicryl suture. Dermabond was applied to the small right neck incision. A dry sterile dressing was applied. The catheter is ready for use. No immediate complications. IMPRESSION: Ultrasound and fluoroscopically guided right internal jugular tunneled hemodialysis catheter (Palindrome catheter). Electronically Signed   By: Jerilynn Mages.  Shick M.D.   On: 04/10/2015 11:03   Ir US Guide Vasc Access Right  04/10/2015  CLINICAL DATA:  End-stage renal disease, hypertension, no current access to begin dialysis. EXAM: ULTRASOUND GUIDANCE FOR VASCULAR ACCESS RIGHT INTERNAL JUGULAR PERMANENT HEMODIALYSIS CATHETER Date:  10/21/201610/21/2016 10:12 am Radiologist:  M. Daryll Brod, MD Guidance:  Ultrasound and fluoroscopic FLUOROSCOPY TIME:  30 seconds, 10.7 mGy MEDICATIONS AND MEDICAL HISTORY: 2 g Ancef administered within 1 hour of the procedure, 1 mg Versed, 50 mcg fentanyl ANESTHESIA/SEDATION: 15 minutes CONTRAST:  None. COMPLICATIONS: None immediate PROCEDURE: Informed consent was obtained from the patient following explanation of the procedure, risks, benefits and alternatives. The patient understands, agrees and consents for the procedure. All questions were addressed. A time out was performed. Maximal barrier sterile technique utilized including caps, mask, sterile gowns, sterile gloves, large sterile drape, hand hygiene, and 2% chlorhexidine scrub. Under sterile conditions and local anesthesia, right internal jugular micropuncture venous access was performed with ultrasound. Images were obtained for documentation. A guide wire was inserted followed by a transitional dilator. Next, a 0.035 guidewire was advanced into the IVC with a 5-French catheter. Measurements were obtained from the right venotomy site to the proximal right atrium. In the right infraclavicular chest, a subcutaneous tunnel was created under sterile conditions and local anesthesia. 1%  lidocaine with epinephrine was utilized for this. The 19 cm tip to cuff palindrome catheter was tunneled subcutaneously to the venotomy site and inserted into the SVC/RA junction through a valved peel-away sheath. Position was confirmed with fluoroscopy. Images were obtained for documentation. Blood was aspirated from the catheter followed by saline and heparin flushes. The appropriate volume and strength of heparin was instilled in each lumen. Caps were applied. The catheter was secured at the tunnel site with Gelfoam and a pursestring suture.  The venotomy site was closed with subcuticular Vicryl suture. Dermabond was applied to the small right neck incision. A dry sterile dressing was applied. The catheter is ready for use. No immediate complications. IMPRESSION: Ultrasound and fluoroscopically guided right internal jugular tunneled hemodialysis catheter (Palindrome catheter). Electronically Signed   By: Jerilynn Mages.  Shick M.D.   On: 04/10/2015 11:03   Ct Renal Stone Study  04/06/2015  CLINICAL DATA:  Acute onset of renal failure. Nausea. Recent placement of right ureteral stent for obstructing ureteral stone. Initial encounter. EXAM: CT ABDOMEN AND PELVIS WITHOUT CONTRAST TECHNIQUE: Multidetector CT imaging of the abdomen and pelvis was performed following the standard protocol without IV contrast. COMPARISON:  CT of the abdomen and pelvis performed 02/20/2015, and renal ultrasound performed 02/27/2015 FINDINGS: The visualized lung bases are clear. A large hiatal hernia is noted. Mild calcification is noted at the mitral valve. The liver and spleen are unremarkable in appearance. The patient is status post cholecystectomy, with clips noted at the gallbladder fossa. The pancreas and adrenal glands are unremarkable. There is mild right-sided and minimal left-sided hydronephrosis, with diffuse wall thickening at the renal calyces and pelves, extending along both ureters, and mild perinephric stranding. Findings are  concerning for pyelonephritis and ureteritis. A right ureteral stent is noted in expected position. No obstructing ureteral stones are seen. There is wall thickening about the bladder, with associated soft tissue inflammation, reflecting underlying cystitis. No free fluid is identified. The small bowel is unremarkable in appearance. The stomach is within normal limits. No acute vascular abnormalities are seen. The appendix is normal in caliber, without evidence of appendicitis. Mild scattered diverticulosis is noted along the transverse, descending and proximal sigmoid colon, without evidence of diverticulitis. There is a relatively broad-based moderate anterior abdominal wall hernia extending into the pannus, containing only fat. A small periumbilical hernia is also noted, to the right of the umbilicus, containing only fat. The patient is status post hysterectomy. No suspicious adnexal masses are seen. No inguinal lymphadenopathy is seen. No acute osseous abnormalities are identified. There is grade 1 retrolisthesis of L2 on L3, with associated vacuum phenomenon and endplate sclerotic change, and minimal grade 1 retrolisthesis of L3 on L4. IMPRESSION: 1. Mild right-sided and minimal left-sided hydronephrosis, with diffuse wall thickening at the renal calyces and pelves, extending along both ureters, and mild perinephric stranding. Findings concerning for bilateral pyelonephritis and ureteritis, somewhat more prominent than in September. Right ureteral stent noted in expected position. No obstructing ureteral stone seen. 2. Diffuse bladder wall thickening, with associated soft tissue inflammation, concerning for underlying cystitis. 3. Large hiatal hernia noted. 4. Mild scattered diverticulosis along the transverse, descending and proximal sigmoid colon, without evidence of diverticulitis. 5. Relatively broad-based moderate anterior abdominal wall hernia extending into the pannus, containing only fat. Small  periumbilical hernia to the right of the umbilicus, also containing only fat. 6. Mild degenerative change at the mid lumbar spine. Electronically Signed   By: Garald Balding M.D.   On: 04/06/2015 22:21   Ir Nephrostomy Placement Right  04/11/2015  CLINICAL DATA:  PERSISTENT RIGHT HYDRONEPHROSIS DESPITE URETERAL STENT INSERTION. RENAL FAILURE, ELEVATED CREATININE. EXAM: ULTRASOUND FLUOROSCOPIC 10 FRENCH RIGHT NEPHROSTOMY INSERTION Date:  10/22/201610/22/2016 2:23 pm Radiologist:  M. Daryll Brod, MD Guidance:  Ultrasound and fluoroscopic FLUOROSCOPY TIME:  1 minutes 12 seconds, 60 mGy MEDICATIONS AND MEDICAL HISTORY: Patient is already receiving 1 g Rocephin every 24 hours. 1.5 mg Versed, 75 mcg fentanyl ANESTHESIA/SEDATION: 10 minutes CONTRAST:  24mL OMNIPAQUE IOHEXOL 300 MG/ML  SOLN COMPLICATIONS: None immediate PROCEDURE: Informed consent was obtained from the patient following explanation of the procedure, risks, benefits and alternatives. The patient understands, agrees and consents for the procedure. All questions were addressed. A time out was performed. Maximal barrier sterile technique utilized including caps, mask, sterile gowns, sterile gloves, large sterile drape, hand hygiene, and ChloraPrep. Under sterile conditions and local anesthesia, ultrasound micropuncture needle access performed of a right mid to lower pole dilated calyx. Needle position confirmed with ultrasound. Images obtained for documentation. There was return of cloudy exudative urine. Guidewire advanced easily into the renal pelvis. Accustick dilator set advanced. Amplatz guidewire exchange performed. Tract dilatation performed to advance a 10 French nephrostomy with the retention loop formed in the renal pelvis. Contrast injection confirms position. Gravity drainage bag connected. Catheter secured with a Prolene suture and a sterile dressing. No immediate complication. Patient tolerated the procedure well. IMPRESSION: Successful  ultrasound and fluoroscopic 10 French right nephrostomy insertion. Electronically Signed   By: Jerilynn Mages.  Shick M.D.   On: 04/11/2015 14:33    CBC  Recent Labs Lab 04/08/15 0915  04/11/15 0343 04/11/15 1450 04/12/15 0327 04/13/15 0451 04/14/15 0729  WBC 6.9  < > 7.3 6.9 7.2 6.5 7.4  HGB 7.3*  < > 7.4* 8.1* 7.1* 6.9* 8.0*  HCT 23.5*  < > 24.2* 27.4* 23.7* 23.2* 26.3*  PLT 350  < > 321 367 294 266 252  MCV 86.7  < > 88.3 89.0 88.8 89.6 87.7  MCH 26.9  < > 27.0 26.3 26.6 26.3 26.7  MCHC 31.1  < > 30.6 29.6* 30.0 29.3* 30.4  RDW 16.9*  < > 16.8* 16.6* 16.6* 16.6* 16.2*  LYMPHSABS 1.0  --   --   --   --   --   --   MONOABS 0.4  --   --   --   --   --   --   EOSABS 0.3  --   --   --   --   --   --   BASOSABS 0.0  --   --   --   --   --   --   < > = values in this interval not displayed.  Chemistries   Recent Labs Lab 04/11/15 0343 04/11/15 1450 04/12/15 0327 04/13/15 0451 04/14/15 0729  NA 135 134* 133* 137 132*  K 3.6 3.3* 4.4 3.7 3.8  CL 93* 89* 91* 90* 88*  CO2 34* 35* 34* 38* 35*  GLUCOSE 113* 112* 108* 113* 105*  BUN 18 19 22* 22* 22*  CREATININE 3.49* 4.09* 4.26* 4.44* 4.82*  CALCIUM 7.8* 8.3* 7.8* 8.5* 8.8*   ------------------------------------------------------------------------------------------------------------------ estimated creatinine clearance is 11.1 mL/min (by C-G formula based on Cr of 4.82). ------------------------------------------------------------------------------------------------------------------ No results for input(s): HGBA1C in the last 72 hours. ------------------------------------------------------------------------------------------------------------------ No results for input(s): CHOL, HDL, LDLCALC, TRIG, CHOLHDL, LDLDIRECT in the last 72 hours. ------------------------------------------------------------------------------------------------------------------ No results for input(s): TSH, T4TOTAL, T3FREE, THYROIDAB in the last 72  hours.  Invalid input(s): FREET3 ------------------------------------------------------------------------------------------------------------------ No results for input(s): VITAMINB12, FOLATE, FERRITIN, TIBC, IRON, RETICCTPCT in the last 72 hours.  Coagulation profile No results for input(s): INR, PROTIME in the last 168 hours.  No results for input(s): DDIMER in the last 72 hours.  Cardiac Enzymes No results for input(s): CKMB, TROPONINI, MYOGLOBIN in the last 168 hours.  Invalid input(s): CK ------------------------------------------------------------------------------------------------------------------ Invalid input(s): POCBNP    Halie Gass D.O. on 04/14/2015 at 12:12 PM  Between 7am to 7pm - Pager -  [phone]  After 7pm go to [email or web address] - password [email or web address]  And look for the night coverage person covering for me after hours  [facility] Office  [phone]

## 2015-04-14 NOTE — Progress Notes (Signed)
S:  Feels well.  Says she is drinking plenty of fluid but it is not being recorded O:BP 118/58 mmHg  Pulse 85  Temp(Src) 98.6 F (37 C) (Oral)  Resp 18  Ht 5\' 3"  (1.6 m)  Wt 83.8 kg (184 lb 11.9 oz)  BMI 32.73 kg/m2  SpO2 94%  LMP 01/19/1992  Intake/Output Summary (Last 24 hours) at 04/14/15 0915 Last data filed at 04/14/15 0456  Gross per 24 hour  Intake    385 ml  Output   1900 ml  Net  -1515 ml   Weight change: -0.212 kg (-7.5 oz) [id number] and alert CVS:RRR Resp:clear Abd:+ BS NTND Ext:no edema NEURO:CNI Ox3 no asterixis Rt Permcath Rt PCN   . sodium chloride   Intravenous Once  . famotidine  10 mg Oral BID AC  . feeding supplement (NEPRO CARB STEADY)  237 mL Oral BID BM  . heparin  5,000 Units Subcutaneous 3 times per day  . latanoprost  1 drop Both Eyes QHS  . saccharomyces boulardii  250 mg Oral BID  . timolol  1 drop Both Eyes q morning - 10a   No results found. BMET    Component Value Date/Time   NA 132* 04/14/2015 0729   K 3.8 04/14/2015 0729   CL 88* 04/14/2015 0729   CO2 35* 04/14/2015 0729   GLUCOSE 105* 04/14/2015 0729   BUN 22* 04/14/2015 0729   CREATININE 4.82* 04/14/2015 0729   CALCIUM 8.8* 04/14/2015 0729   GFRNONAA 8* 04/14/2015 0729   GFRAA 10* 04/14/2015 0729   CBC    Component Value Date/Time   WBC 7.4 04/14/2015 0729   RBC 3.00* 04/14/2015 0729   RBC 2.71* 04/08/2015 0915   HGB 8.0* 04/14/2015 0729   HCT 26.3* 04/14/2015 0729   HCT 23.6* 04/08/2015 0545   PLT 252 04/14/2015 0729   MCV 87.7 04/14/2015 0729   MCH 26.7 04/14/2015 0729   MCHC 30.4 04/14/2015 0729   RDW 16.2* 04/14/2015 0729   LYMPHSABS 1.0 04/08/2015 0915   MONOABS 0.4 04/08/2015 0915   EOSABS 0.3 04/08/2015 0915   BASOSABS 0.0 04/08/2015 0915     Assessment:  1. Acute on CKD 3/4 sec to recurrent obstruction.  Now with Rt perc and UO has significantly increased though Scr sl higher 2. Anemia 3. HTN  Plan: 1. I would like to see Scr  stabilizing/improving before considering DC.  No indication for HD 2. Start aranesp   [name] T

## 2015-04-15 DIAGNOSIS — N179 Acute kidney failure, unspecified: Principal | ICD-10-CM

## 2015-04-15 DIAGNOSIS — N184 Chronic kidney disease, stage 4 (severe): Secondary | ICD-10-CM

## 2015-04-15 DIAGNOSIS — I10 Essential (primary) hypertension: Secondary | ICD-10-CM

## 2015-04-15 DIAGNOSIS — E871 Hypo-osmolality and hyponatremia: Secondary | ICD-10-CM

## 2015-04-15 DIAGNOSIS — N1330 Unspecified hydronephrosis: Secondary | ICD-10-CM

## 2015-04-15 LAB — RENAL FUNCTION PANEL
Albumin: 1.9 g/dL — ABNORMAL LOW (ref 3.5–5.0)
Anion gap: 10 (ref 5–15)
BUN: 25 mg/dL — ABNORMAL HIGH (ref 6–20)
CO2: 33 mmol/L — ABNORMAL HIGH (ref 22–32)
Calcium: 8.4 mg/dL — ABNORMAL LOW (ref 8.9–10.3)
Chloride: 88 mmol/L — ABNORMAL LOW (ref 101–111)
Creatinine, Ser: 4.98 mg/dL — ABNORMAL HIGH (ref 0.44–1.00)
GFR calc Af Amer: 9 mL/min — ABNORMAL LOW (ref >60)
GFR calc non Af Amer: 8 mL/min — ABNORMAL LOW (ref >60)
Glucose, Bld: 101 mg/dL — ABNORMAL HIGH (ref 65–99)
Phosphorus: 2.7 mg/dL (ref 2.5–4.6)
Potassium: 3.5 mmol/L (ref 3.5–5.1)
Sodium: 131 mmol/L — ABNORMAL LOW (ref 135–145)

## 2015-04-15 LAB — CBC
HCT: 22.8 % — ABNORMAL LOW (ref 36.0–46.0)
Hemoglobin: 7.5 g/dL — ABNORMAL LOW (ref 12.0–15.0)
MCH: 30.5 pg (ref 26.0–34.0)
MCHC: 32.9 g/dL (ref 30.0–36.0)
MCV: 92.7 fL (ref 78.0–100.0)
Platelets: 167 10*3/uL (ref 150–400)
RBC: 2.46 MIL/uL — ABNORMAL LOW (ref 3.87–5.11)
RDW: 15.3 % (ref 11.5–15.5)
WBC: 6.1 10*3/uL (ref 4.0–10.5)

## 2015-04-15 MED ORDER — SODIUM CHLORIDE 0.9 % IV SOLN
INTRAVENOUS | Status: DC
  Administered 2015-04-15 – 2015-04-16 (×3): via INTRAVENOUS

## 2015-04-15 NOTE — Progress Notes (Signed)
PROGRESS NOTE  [name] [id number] DOB: [birth year] DOA: 04/06/2015 PCP: [name], MD Brief History 69 y.o. female  Patient was seen today by her primary care physician who drew routine labs admitting: The patient to report elevated creatinine and ask her to go to the emergency room for further evaluation. With the exception of one-week history of intermittent nausea, dysuria, and left lower back pain patient states she's been in her normal state of health and had no inkling of any underlying illness or recent echo the emergency room. The patient was noted to have new onset right-sided hydronephrosis on CT with a ureteral stones in September 2016. She was taken and underwent cystoscopy with right ureteral stent on 02/26/2015.   Assessment/Plan: Acute on chronic kidney disease, Stage IV with uremia -partly obstructive uropathy -Upon admission, Cr 8.02. had HD 10/21 -Cr 4.98 today (slightly increased from yesterday) -Nephrology consulted and appreciated -IR placed HD cath and right nephrostomy tube 10/22 -UOP 1975 over past 24hrs -Continue to monitor creatinine  Pyelonephritis/UTI? -CT abdomen and pelvis noted perinephric stranding -Patient has had some back pain currently afebrile, WBCs normalized. -urine culture showed multiple species present -Patient received 7 days of Rocephin--last day on 10/23 -Blood cultures show no growth to date   Nephroureterolithiasis/right hydronephrosis -Patient did have stent placed by Dr. Tannenbaum--02/26/2015 -Patient had right perc placed by IR 10/22  Nausea -Likely secondary to uremia- has improved -Tolerating diet -Continue antiemetics as needed   Anemia of chronic disease -Hemoglobin currently 6.9 -dose of feraheme given and unit PRBC 04/13/2015 -started on aranesp by nephrology  Hypertension -Stable, metoprolol and amlodipine held   Headache (frontal) -Improving, Continue pain control and saline nasal  spray  Hypokalemia -Resolved, continue to monitor BMP Hyponatremia -secondary to renal failure--stable  Code Status: Full  Family Communication: patient only  Disposition Plan: home when cleared by renal   Procedures  Renal Scan IR placement of RIJ HD cath IR Insertion of right PCN  Consults  Nephrology Interventional radiology Vascular surgery Urology      Procedures/Studies: Nm Renal Imaging Flow W/pharm  04/08/2015  CLINICAL DATA:  Acute kidney disease. History of left renal atrophy and chronic right hydronephrosis. EXAM: NUCLEAR MEDICINE RENAL SCAN WITH DIURETIC ADMINISTRATION TECHNIQUE: Radionuclide angiographic and sequential renal images were obtained after intravenous injection of radiopharmaceutical. Imaging was continued during slow intravenous injection of Lasix approximately 15 minutes after the start of the examination. RADIOPHARMACEUTICALS:  13.9 mCi Technetium-49m MAG3 IV COMPARISON:  None. FINDINGS: Flow: Decreased perfusion to the smaller left kidney. Normal perfusion to the right kidney. Left renogram: Diminished cortical uptake by the small left kidney. There is delayed excretion of the radiopharmaceutical. Right renogram: Normal cortical uptake. There is diminished clearance of the radiopharmaceutical from the dilated right renal collecting system. Differential: Left kidney = 28 % Right kidney = 72 % T1/2 post Lasix : Left kidney = not achieved min Right kidney = not achieved. Min IMPRESSION: 1. Diminished left renal function. There is delayed perfusion, cortical uptake and excretion of the radiopharmaceutical by the small left kidney. 2. Delayed and diminished clearance of the radiopharmaceutical from the dilated right renal collecting system compatible with partial obstruction. 3. Split renal function is equal to 72% from the right kidney and 28% from the left kidney. Electronically Signed   By: Kerby Moors M.D.   On: 04/08/2015 16:34   US  Renal  04/11/2015  CLINICAL DATA:  Hydronephrosis, poorly functioning right  ureteral stent, currently on dialysis, elevated creatinine EXAM: RENAL / URINARY TRACT ULTRASOUND COMPLETE COMPARISON:  04/06/2015 CT FINDINGS: Right Kidney: Length: 11.5 cm. Normal echogenicity and cortical thickness. Ureteral stent extends into the upper pole by ultrasound. Moderate hydronephrosis evident. No focal abnormality. Left Kidney: Length: 9.5 cm. Mild cortical thinning but normal echogenicity. Mild left hydronephrosis and proximal hydroureter. Bladder: Appears normal for degree of bladder distention. IMPRESSION: Right greater than left hydronephrosis despite right ureteral stent insertion Electronically Signed   By: Jerilynn Mages.  Shick M.D.   On: 04/11/2015 13:53   Ir Fluoro Guide Cv Line Right  04/10/2015  CLINICAL DATA:  End-stage renal disease, hypertension, no current access to begin dialysis. EXAM: ULTRASOUND GUIDANCE FOR VASCULAR ACCESS RIGHT INTERNAL JUGULAR PERMANENT HEMODIALYSIS CATHETER Date:  10/21/201610/21/2016 10:12 am Radiologist:  M. Daryll Brod, MD Guidance:  Ultrasound and fluoroscopic FLUOROSCOPY TIME:  30 seconds, 10.7 mGy MEDICATIONS AND MEDICAL HISTORY: 2 g Ancef administered within 1 hour of the procedure, 1 mg Versed, 50 mcg fentanyl ANESTHESIA/SEDATION: 15 minutes CONTRAST:  None. COMPLICATIONS: None immediate PROCEDURE: Informed consent was obtained from the patient following explanation of the procedure, risks, benefits and alternatives. The patient understands, agrees and consents for the procedure. All questions were addressed. A time out was performed. Maximal barrier sterile technique utilized including caps, mask, sterile gowns, sterile gloves, large sterile drape, hand hygiene, and 2% chlorhexidine scrub. Under sterile conditions and local anesthesia, right internal jugular micropuncture venous access was performed with ultrasound. Images were obtained for documentation. A guide wire was inserted  followed by a transitional dilator. Next, a 0.035 guidewire was advanced into the IVC with a 5-French catheter. Measurements were obtained from the right venotomy site to the proximal right atrium. In the right infraclavicular chest, a subcutaneous tunnel was created under sterile conditions and local anesthesia. 1% lidocaine with epinephrine was utilized for this. The 19 cm tip to cuff palindrome catheter was tunneled subcutaneously to the venotomy site and inserted into the SVC/RA junction through a valved peel-away sheath. Position was confirmed with fluoroscopy. Images were obtained for documentation. Blood was aspirated from the catheter followed by saline and heparin flushes. The appropriate volume and strength of heparin was instilled in each lumen. Caps were applied. The catheter was secured at the tunnel site with Gelfoam and a pursestring suture. The venotomy site was closed with subcuticular Vicryl suture. Dermabond was applied to the small right neck incision. A dry sterile dressing was applied. The catheter is ready for use. No immediate complications. IMPRESSION: Ultrasound and fluoroscopically guided right internal jugular tunneled hemodialysis catheter (Palindrome catheter). Electronically Signed   By: Jerilynn Mages.  Shick M.D.   On: 04/10/2015 11:03   Ir US Guide Vasc Access Right  04/10/2015  CLINICAL DATA:  End-stage renal disease, hypertension, no current access to begin dialysis. EXAM: ULTRASOUND GUIDANCE FOR VASCULAR ACCESS RIGHT INTERNAL JUGULAR PERMANENT HEMODIALYSIS CATHETER Date:  10/21/201610/21/2016 10:12 am Radiologist:  M. Daryll Brod, MD Guidance:  Ultrasound and fluoroscopic FLUOROSCOPY TIME:  30 seconds, 10.7 mGy MEDICATIONS AND MEDICAL HISTORY: 2 g Ancef administered within 1 hour of the procedure, 1 mg Versed, 50 mcg fentanyl ANESTHESIA/SEDATION: 15 minutes CONTRAST:  None. COMPLICATIONS: None immediate PROCEDURE: Informed consent was obtained from the patient following explanation of  the procedure, risks, benefits and alternatives. The patient understands, agrees and consents for the procedure. All questions were addressed. A time out was performed. Maximal barrier sterile technique utilized including caps, mask, sterile gowns, sterile gloves, large sterile drape, hand hygiene, and 2%  chlorhexidine scrub. Under sterile conditions and local anesthesia, right internal jugular micropuncture venous access was performed with ultrasound. Images were obtained for documentation. A guide wire was inserted followed by a transitional dilator. Next, a 0.035 guidewire was advanced into the IVC with a 5-French catheter. Measurements were obtained from the right venotomy site to the proximal right atrium. In the right infraclavicular chest, a subcutaneous tunnel was created under sterile conditions and local anesthesia. 1% lidocaine with epinephrine was utilized for this. The 19 cm tip to cuff palindrome catheter was tunneled subcutaneously to the venotomy site and inserted into the SVC/RA junction through a valved peel-away sheath. Position was confirmed with fluoroscopy. Images were obtained for documentation. Blood was aspirated from the catheter followed by saline and heparin flushes. The appropriate volume and strength of heparin was instilled in each lumen. Caps were applied. The catheter was secured at the tunnel site with Gelfoam and a pursestring suture. The venotomy site was closed with subcuticular Vicryl suture. Dermabond was applied to the small right neck incision. A dry sterile dressing was applied. The catheter is ready for use. No immediate complications. IMPRESSION: Ultrasound and fluoroscopically guided right internal jugular tunneled hemodialysis catheter (Palindrome catheter). Electronically Signed   By: Jerilynn Mages.  Shick M.D.   On: 04/10/2015 11:03   Ct Renal Stone Study  04/06/2015  CLINICAL DATA:  Acute onset of renal failure. Nausea. Recent placement of right ureteral stent for obstructing  ureteral stone. Initial encounter. EXAM: CT ABDOMEN AND PELVIS WITHOUT CONTRAST TECHNIQUE: Multidetector CT imaging of the abdomen and pelvis was performed following the standard protocol without IV contrast. COMPARISON:  CT of the abdomen and pelvis performed 02/20/2015, and renal ultrasound performed 02/27/2015 FINDINGS: The visualized lung bases are clear. A large hiatal hernia is noted. Mild calcification is noted at the mitral valve. The liver and spleen are unremarkable in appearance. The patient is status post cholecystectomy, with clips noted at the gallbladder fossa. The pancreas and adrenal glands are unremarkable. There is mild right-sided and minimal left-sided hydronephrosis, with diffuse wall thickening at the renal calyces and pelves, extending along both ureters, and mild perinephric stranding. Findings are concerning for pyelonephritis and ureteritis. A right ureteral stent is noted in expected position. No obstructing ureteral stones are seen. There is wall thickening about the bladder, with associated soft tissue inflammation, reflecting underlying cystitis. No free fluid is identified. The small bowel is unremarkable in appearance. The stomach is within normal limits. No acute vascular abnormalities are seen. The appendix is normal in caliber, without evidence of appendicitis. Mild scattered diverticulosis is noted along the transverse, descending and proximal sigmoid colon, without evidence of diverticulitis. There is a relatively broad-based moderate anterior abdominal wall hernia extending into the pannus, containing only fat. A small periumbilical hernia is also noted, to the right of the umbilicus, containing only fat. The patient is status post hysterectomy. No suspicious adnexal masses are seen. No inguinal lymphadenopathy is seen. No acute osseous abnormalities are identified. There is grade 1 retrolisthesis of L2 on L3, with associated vacuum phenomenon and endplate sclerotic change, and  minimal grade 1 retrolisthesis of L3 on L4. IMPRESSION: 1. Mild right-sided and minimal left-sided hydronephrosis, with diffuse wall thickening at the renal calyces and pelves, extending along both ureters, and mild perinephric stranding. Findings concerning for bilateral pyelonephritis and ureteritis, somewhat more prominent than in September. Right ureteral stent noted in expected position. No obstructing ureteral stone seen. 2. Diffuse bladder wall thickening, with associated soft tissue inflammation, concerning for  underlying cystitis. 3. Large hiatal hernia noted. 4. Mild scattered diverticulosis along the transverse, descending and proximal sigmoid colon, without evidence of diverticulitis. 5. Relatively broad-based moderate anterior abdominal wall hernia extending into the pannus, containing only fat. Small periumbilical hernia to the right of the umbilicus, also containing only fat. 6. Mild degenerative change at the mid lumbar spine. Electronically Signed   By: Garald Balding M.D.   On: 04/06/2015 22:21   Ir Nephrostomy Placement Right  04/11/2015  CLINICAL DATA:  PERSISTENT RIGHT HYDRONEPHROSIS DESPITE URETERAL STENT INSERTION. RENAL FAILURE, ELEVATED CREATININE. EXAM: ULTRASOUND FLUOROSCOPIC 10 FRENCH RIGHT NEPHROSTOMY INSERTION Date:  10/22/201610/22/2016 2:23 pm Radiologist:  M. Daryll Brod, MD Guidance:  Ultrasound and fluoroscopic FLUOROSCOPY TIME:  1 minutes 12 seconds, 60 mGy MEDICATIONS AND MEDICAL HISTORY: Patient is already receiving 1 g Rocephin every 24 hours. 1.5 mg Versed, 75 mcg fentanyl ANESTHESIA/SEDATION: 10 minutes CONTRAST:  93mL OMNIPAQUE IOHEXOL 300 MG/ML  SOLN COMPLICATIONS: None immediate PROCEDURE: Informed consent was obtained from the patient following explanation of the procedure, risks, benefits and alternatives. The patient understands, agrees and consents for the procedure. All questions were addressed. A time out was performed. Maximal barrier sterile technique utilized  including caps, mask, sterile gowns, sterile gloves, large sterile drape, hand hygiene, and ChloraPrep. Under sterile conditions and local anesthesia, ultrasound micropuncture needle access performed of a right mid to lower pole dilated calyx. Needle position confirmed with ultrasound. Images obtained for documentation. There was return of cloudy exudative urine. Guidewire advanced easily into the renal pelvis. Accustick dilator set advanced. Amplatz guidewire exchange performed. Tract dilatation performed to advance a 10 French nephrostomy with the retention loop formed in the renal pelvis. Contrast injection confirms position. Gravity drainage bag connected. Catheter secured with a Prolene suture and a sterile dressing. No immediate complication. Patient tolerated the procedure well. IMPRESSION: Successful ultrasound and fluoroscopic 10 French right nephrostomy insertion. Electronically Signed   By: Jerilynn Mages.  Shick M.D.   On: 04/11/2015 14:33         Subjective: Patient denies fevers, chills, headache, chest pain, dyspnea, nausea, vomiting, diarrhea, abdominal pain, dysuria, hematuria   Objective: Filed Vitals:   04/14/15 1100 04/14/15 2105 04/15/15 0433 04/15/15 1714  BP: 106/55 94/47 101/51 117/54  Pulse: 92 89 82 80  Temp: 98 F (36.7 C) 98.5 F (36.9 C) 98.5 F (36.9 C) 98.1 F (36.7 C)  TempSrc: Oral Oral Oral Oral  Resp: 18  16 18   Height:      Weight:      SpO2: 96% 95% 92% 94%    Intake/Output Summary (Last 24 hours) at 04/15/15 1723 Last data filed at 04/15/15 1607  Gross per 24 hour  Intake    850 ml  Output   2725 ml  Net  -1875 ml   Weight change:  Exam:   General:  Pt is alert, follows commands appropriately, not in acute distress  HEENT: No icterus, No thrush, No neck mass, [place]/AT  Cardiovascular: RRR, S1/S2, no rubs, no gallops  Respiratory: CTA bilaterally, no wheezing, no crackles, no rhonchi  Abdomen: Soft/+BS, non tender, non distended, no  guarding  Extremities: No edema, No lymphangitis, No petechiae, No rashes, no synovitis  Data Reviewed: Basic Metabolic Panel:  Recent Labs Lab 04/11/15 1450 04/12/15 0327 04/13/15 0451 04/14/15 0729 04/15/15 0342  NA 134* 133* 137 132* 131*  K 3.3* 4.4 3.7 3.8 3.5  CL 89* 91* 90* 88* 88*  CO2 35* 34* 38* 35* 33*  GLUCOSE 112* 108*  113* 105* 101*  BUN 19 22* 22* 22* 25*  CREATININE 4.09* 4.26* 4.44* 4.82* 4.98*  CALCIUM 8.3* 7.8* 8.5* 8.8* 8.4*  PHOS 2.9 2.7 2.6 3.0 2.7   Liver Function Tests:  Recent Labs Lab 04/11/15 1450 04/12/15 0327 04/13/15 0451 04/14/15 0729 04/15/15 0342  ALBUMIN 2.5* 2.0* 1.9* 1.9* 1.9*   No results for input(s): LIPASE, AMYLASE in the last 168 hours. No results for input(s): AMMONIA in the last 168 hours. CBC:  Recent Labs Lab 04/11/15 1450 04/12/15 0327 04/13/15 0451 04/14/15 0729 04/15/15 0342  WBC 6.9 7.2 6.5 7.4 6.1  HGB 8.1* 7.1* 6.9* 8.0* 7.5*  HCT 27.4* 23.7* 23.2* 26.3* 22.8*  MCV 89.0 88.8 89.6 87.7 92.7  PLT 367 294 266 252 167   Cardiac Enzymes: No results for input(s): CKTOTAL, CKMB, CKMBINDEX, TROPONINI in the last 168 hours. BNP: Invalid input(s): POCBNP CBG: No results for input(s): GLUCAP in the last 168 hours.  Recent Results (from the past 240 hour(s))  Urine culture     Status: None   Collection Time: 04/06/15  9:46 PM  Result Value Ref Range Status   Specimen Description URINE, CLEAN CATCH  Final   Special Requests NONE  Final   Culture   Final    MULTIPLE SPECIES PRESENT, SUGGEST RECOLLECTION Performed at [facility]    Report Status 04/08/2015 FINAL  Final  Culture, blood (routine x 2)     Status: None   Collection Time: 04/07/15  1:55 AM  Result Value Ref Range Status   Specimen Description BLOOD LEFT ARM  Final   Special Requests BOTTLES DRAWN AEROBIC AND ANAEROBIC 10CC  Final   Culture   Final    NO GROWTH 5 DAYS Performed at [facility]    Report Status 04/12/2015 FINAL   Final  Culture, blood (routine x 2)     Status: None   Collection Time: 04/07/15  2:00 AM  Result Value Ref Range Status   Specimen Description BLOOD LEFT HAND  Final   Special Requests BOTTLES DRAWN AEROBIC AND ANAEROBIC 10CC  Final   Culture   Final    NO GROWTH 5 DAYS Performed at [facility]    Report Status 04/12/2015 FINAL  Final     Scheduled Meds: . sodium chloride   Intravenous Once  . darbepoetin (ARANESP) injection - NON-DIALYSIS  100 mcg Subcutaneous Q Tue-1800  . famotidine  10 mg Oral BID AC  . feeding supplement (NEPRO CARB STEADY)  237 mL Oral BID BM  . heparin  5,000 Units Subcutaneous 3 times per day  . latanoprost  1 drop Both Eyes QHS  . saccharomyces boulardii  250 mg Oral BID  . timolol  1 drop Both Eyes q morning - 10a   Continuous Infusions: . sodium chloride 100 mL/hr at 04/15/15 1613     Madelena Maturin, DO  [facility] Pager [phone]  If 7PM-7AM, please contact night-coverage [email or web address] Password [email or web address] 04/15/2015, 5:23 PM   LOS: 9 days

## 2015-04-15 NOTE — Progress Notes (Signed)
Referring Physician(s): Urology   Chief Complaint: Persistent right hydronephrosis secondary to ureteral obstruction s/p right PCN 10/22  Subjective: Patient states minimal pain at right PCN, admits to good yellow urine output in bag and good urine output via urethra   Allergies: Vicodin  Medications: Prior to Admission medications   Medication Sig Start Date End Date Taking? Authorizing Provider  acetaminophen (TYLENOL) 500 MG tablet Take 500 mg by mouth every 6 (six) hours as needed for moderate pain.   Yes Historical Provider, MD  bimatoprost (LUMIGAN) 0.01 % SOLN Place 1 drop into both eyes at bedtime.   Yes Historical Provider, MD  metoprolol tartrate (LOPRESSOR) 25 MG tablet Take 1 tablet (25 mg total) by mouth 2 (two) times daily. Patient taking differently: Take 25 mg by mouth daily.  11/21/14  Yes Jettie Booze, MD  ondansetron (ZOFRAN ODT) 4 MG disintegrating tablet Take 1 tablet (4 mg total) by mouth every 8 (eight) hours as needed for nausea or vomiting. 11/27/14  Yes Velvet Bathe, MD  ranitidine (ZANTAC) 150 MG tablet Take 150 mg by mouth 2 (two) times daily as needed for heartburn.   Yes Historical Provider, MD  timolol (BETIMOL) 0.5 % ophthalmic solution Place 1 drop into both eyes every morning.    Yes Historical Provider, MD  amLODipine (NORVASC) 5 MG tablet Take 1 tablet (5 mg total) by mouth every evening. Patient not taking: Reported on 02/25/2015 11/21/14   Jettie Booze, MD  cephALEXin (KEFLEX) 500 MG capsule Take 1 capsule (500 mg total) by mouth 2 (two) times daily. Patient not taking: Reported on 04/06/2015 03/01/15   Oswald Hillock, MD   Vital Signs: BP 101/51 mmHg  Pulse 82  Temp(Src) 98.5 F (36.9 C) (Oral)  Resp 16  Ht 5\' 3"  (1.6 m)  Wt 184 lb 11.9 oz (83.8 kg)  BMI 32.73 kg/m2  SpO2 92%  LMP 01/19/1992  Physical Exam General: A&Ox3, NAD Abd: Soft, NT, ND, Right PCN intact, NT, 100 cc clear yellow urine in bag, 1700 cc/24 hrs  Imaging: US  Renal  04/11/2015  CLINICAL DATA:  Hydronephrosis, poorly functioning right ureteral stent, currently on dialysis, elevated creatinine EXAM: RENAL / URINARY TRACT ULTRASOUND COMPLETE COMPARISON:  04/06/2015 CT FINDINGS: Right Kidney: Length: 11.5 cm. Normal echogenicity and cortical thickness. Ureteral stent extends into the upper pole by ultrasound. Moderate hydronephrosis evident. No focal abnormality. Left Kidney: Length: 9.5 cm. Mild cortical thinning but normal echogenicity. Mild left hydronephrosis and proximal hydroureter. Bladder: Appears normal for degree of bladder distention. IMPRESSION: Right greater than left hydronephrosis despite right ureteral stent insertion Electronically Signed   By: Jerilynn Mages.  Shick M.D.   On: 04/11/2015 13:53   Ir Nephrostomy Placement Right  04/11/2015  CLINICAL DATA:  PERSISTENT RIGHT HYDRONEPHROSIS DESPITE URETERAL STENT INSERTION. RENAL FAILURE, ELEVATED CREATININE. EXAM: ULTRASOUND FLUOROSCOPIC 10 FRENCH RIGHT NEPHROSTOMY INSERTION Date:  10/22/201610/22/2016 2:23 pm Radiologist:  M. Daryll Brod, MD Guidance:  Ultrasound and fluoroscopic FLUOROSCOPY TIME:  1 minutes 12 seconds, 60 mGy MEDICATIONS AND MEDICAL HISTORY: Patient is already receiving 1 g Rocephin every 24 hours. 1.5 mg Versed, 75 mcg fentanyl ANESTHESIA/SEDATION: 10 minutes CONTRAST:  25mL OMNIPAQUE IOHEXOL 300 MG/ML  SOLN COMPLICATIONS: None immediate PROCEDURE: Informed consent was obtained from the patient following explanation of the procedure, risks, benefits and alternatives. The patient understands, agrees and consents for the procedure. All questions were addressed. A time out was performed. Maximal barrier sterile technique utilized including caps, mask, sterile gowns, sterile gloves, large  sterile drape, hand hygiene, and ChloraPrep. Under sterile conditions and local anesthesia, ultrasound micropuncture needle access performed of a right mid to lower pole dilated calyx. Needle position confirmed with  ultrasound. Images obtained for documentation. There was return of cloudy exudative urine. Guidewire advanced easily into the renal pelvis. Accustick dilator set advanced. Amplatz guidewire exchange performed. Tract dilatation performed to advance a 10 French nephrostomy with the retention loop formed in the renal pelvis. Contrast injection confirms position. Gravity drainage bag connected. Catheter secured with a Prolene suture and a sterile dressing. No immediate complication. Patient tolerated the procedure well. IMPRESSION: Successful ultrasound and fluoroscopic 10 French right nephrostomy insertion. Electronically Signed   By: Jerilynn Mages.  Shick M.D.   On: 04/11/2015 14:33    Labs:  CBC:  Recent Labs  04/12/15 0327 04/13/15 0451 04/14/15 0729 04/15/15 0342  WBC 7.2 6.5 7.4 6.1  HGB 7.1* 6.9* 8.0* 7.5*  HCT 23.7* 23.2* 26.3* 22.8*  PLT 294 266 252 167    COAGS:  Recent Labs  11/23/14 0305 02/26/15 0015  INR 1.31 1.17  APTT  --  33    BMP:  Recent Labs  04/12/15 0327 04/13/15 0451 04/14/15 0729 04/15/15 0342  NA 133* 137 132* 131*  K 4.4 3.7 3.8 3.5  CL 91* 90* 88* 88*  CO2 34* 38* 35* 33*  GLUCOSE 108* 113* 105* 101*  BUN 22* 22* 22* 25*  CALCIUM 7.8* 8.5* 8.8* 8.4*  CREATININE 4.26* 4.44* 4.82* 4.98*  GFRNONAA 10* 9* 8* 8*  GFRAA 11* 11* 10* 9*    LIVER FUNCTION TESTS:  Recent Labs  11/22/14 1945 11/23/14 0305 02/25/15 1930 04/06/15 2108  04/12/15 0327 04/13/15 0451 04/14/15 0729 04/15/15 0342  BILITOT 0.5 0.3 0.4 0.5  --   --   --   --   --   AST 22 16 18 17   --   --   --   --   --   ALT 27 20 12* 15  --   --   --   --   --   ALKPHOS 166* 133* 82 76  --   --   --   --   --   PROT 6.4* 5.4* 7.7 7.2  --   --   --   --   --   ALBUMIN 2.4* 1.9* 3.6 3.1*  < > 2.0* 1.9* 1.9* 1.9*  < > = values in this interval not displayed.  Assessment and Plan: Acute on chronic kidney disease Persistent right hydronephrosis secondary to ureteral obstruction despite  right JJ stent S/p right PCN 10/22, good yellow urine output, Cr still elevated 4.98 (4.82)- continue to follow daily output Plans per Urology and Nephrology    Signed: Hedy Jacob 04/15/2015, 10:07 AM   I spent a total of 15 Minutes at the the patient's bedside AND on the patient's hospital floor or unit, greater than 50% of which was counseling/coordinating care for persistent right hydronephrosis secondary to ureteral obstruction.

## 2015-04-15 NOTE — Progress Notes (Signed)
S:  Feels well. Up walking O:BP 101/51 mmHg  Pulse 82  Temp(Src) 98.5 F (36.9 C) (Oral)  Resp 16  Ht 5\' 3"  (1.6 m)  Wt 83.8 kg (184 lb 11.9 oz)  BMI 32.73 kg/m2  SpO2 92%  LMP 01/19/1992  Intake/Output Summary (Last 24 hours) at 04/15/15 0946 Last data filed at 04/15/15 0913  Gross per 24 hour  Intake    605 ml  Output   2525 ml  Net  -1920 ml   Weight change:  [id number] and alert CVS:RRR Resp:clear Abd:+ BS NTND Ext:no edema NEURO:CNI Ox3 no asterixis Rt Permcath Rt PCN   . sodium chloride   Intravenous Once  . darbepoetin (ARANESP) injection - NON-DIALYSIS  100 mcg Subcutaneous Q Tue-1800  . famotidine  10 mg Oral BID AC  . feeding supplement (NEPRO CARB STEADY)  237 mL Oral BID BM  . heparin  5,000 Units Subcutaneous 3 times per day  . latanoprost  1 drop Both Eyes QHS  . saccharomyces boulardii  250 mg Oral BID  . timolol  1 drop Both Eyes q morning - 10a   No results found. BMET    Component Value Date/Time   NA 131* 04/15/2015 0342   K 3.5 04/15/2015 0342   CL 88* 04/15/2015 0342   CO2 33* 04/15/2015 0342   GLUCOSE 101* 04/15/2015 0342   BUN 25* 04/15/2015 0342   CREATININE 4.98* 04/15/2015 0342   CALCIUM 8.4* 04/15/2015 0342   GFRNONAA 8* 04/15/2015 0342   GFRAA 9* 04/15/2015 0342   CBC    Component Value Date/Time   WBC 6.1 04/15/2015 0342   RBC 2.46* 04/15/2015 0342   RBC 2.71* 04/08/2015 0915   HGB 7.5* 04/15/2015 0342   HCT 22.8* 04/15/2015 0342   HCT 23.6* 04/08/2015 0545   PLT 167 04/15/2015 0342   MCV 92.7 04/15/2015 0342   MCH 30.5 04/15/2015 0342   MCHC 32.9 04/15/2015 0342   RDW 15.3 04/15/2015 0342   LYMPHSABS 1.0 04/08/2015 0915   MONOABS 0.4 04/08/2015 0915   EOSABS 0.3 04/08/2015 0915   BASOSABS 0.0 04/08/2015 0915     Assessment:  1. Acute on CKD 3/4 sec to recurrent obstruction.  Now with Rt perc and UO has significantly increased though Scr sl higher 2. Anemia on aranesp 3. Hx HTN  Plan: 1. Her lowish BP may be  playing a role in renal fx being slow to improve.  Her Lt kidney shows hydro though this is not new.  Will give some IV fluids today and recheck Scr in AM.  Again, would like to see it stabilized before DC.   [name] T

## 2015-04-16 ENCOUNTER — Other Ambulatory Visit: Payer: Self-pay | Admitting: Urology

## 2015-04-16 DIAGNOSIS — N19 Unspecified kidney failure: Secondary | ICD-10-CM

## 2015-04-16 LAB — RENAL FUNCTION PANEL
Albumin: 1.8 g/dL — ABNORMAL LOW (ref 3.5–5.0)
Anion gap: 7 (ref 5–15)
BUN: 27 mg/dL — ABNORMAL HIGH (ref 6–20)
CO2: 31 mmol/L (ref 22–32)
Calcium: 8.1 mg/dL — ABNORMAL LOW (ref 8.9–10.3)
Chloride: 96 mmol/L — ABNORMAL LOW (ref 101–111)
Creatinine, Ser: 4.77 mg/dL — ABNORMAL HIGH (ref 0.44–1.00)
GFR calc Af Amer: 10 mL/min — ABNORMAL LOW (ref >60)
GFR calc non Af Amer: 8 mL/min — ABNORMAL LOW (ref >60)
Glucose, Bld: 86 mg/dL (ref 65–99)
Phosphorus: 3.9 mg/dL (ref 2.5–4.6)
Potassium: 3.6 mmol/L (ref 3.5–5.1)
Sodium: 134 mmol/L — ABNORMAL LOW (ref 135–145)

## 2015-04-16 MED ORDER — METOPROLOL TARTRATE 25 MG PO TABS: 12.5000 mg | ORAL_TABLET | Freq: Two times a day (BID) | ORAL | Status: DC

## 2015-04-16 NOTE — Care Management Note (Signed)
Case Management Note  Patient Details  Name: [name] MRN: [id number] Date of Birth: 07/11/1945  Subjective/Objective:    CM following for progression and d/c planning.                Action/Plan:  04/16/2015 Met with pt re d/c plan , pt will d/c to home with daughter. HH by [facility] for nephrostomy tube flushes. Will arrange outpt flush of perm cath.  Per Dr Mattingly his office will schedule Perm [facility] along with pt Epigen injections. CM informed of this by Dr D Tat. Will inform pt.  Expected Discharge Date:     04/16/2015             Expected Discharge Plan:  Home w Home Health Services  In-House Referral:     Discharge planning Services  CM Consult  Post Acute Care Choice:  Home Health Choice offered to:  Patient  DME Arranged:  N/A DME Agency:     HH Arranged:  RN HH Agency:  [facility]  Status of Service:  Completed, signed off  Medicare Important Message Given:  Yes-second notification given Date Medicare IM Given:    Medicare IM give by:    Date Additional Medicare IM Given:    Additional Medicare Important Message give by:     If discussed at Long Length of Stay Meetings, dates discussed:    Additional Comments:  ,  U, RN 04/16/2015, 10:00 AM  

## 2015-04-16 NOTE — Progress Notes (Signed)
With patient's daughter at her side,nurse explained how to take care her nephrostomy tube like empty /drain the bag when is already haft filled so that gravity would not pull out the tube from the site,keep her bag below the level of her incisition site to prevent backflow of the urine,if unable to prevent that ,pinch the tube to prevent backflow of urine.,asesses  changes on color and smell of her urine.Watch out for signs of fluid overload like swelling of legs,difficulty of breathing and unable to lie flat in bed.Reminded patient of her medical appointments .Disharged papers were printed,explained and given.Questions by patient and her daughter were answered satisfactorily at the time of discharged.

## 2015-04-16 NOTE — Discharge Summary (Addendum)
Physician Discharge Summary  TYANAH TIETJEN [id number] DOB: 02/18/1946 DOA: 04/06/2015  PCP: [name], MD  Admit date: 04/06/2015 Discharge date: 04/16/2015  Recommendations for Outpatient Follow-up:  1. Pt will need to follow up with PCP in 2 weeks post discharge 2. BMP on 04/20/15 at [place]. Dr. Arlyn Leak office contacted prior to d/c--they will call patient for follow up appointment   Discharge Diagnoses:  Acute on chronic kidney disease, Stage IV with uremia -partly obstructive uropathy -Upon admission, Cr 8.02. had HD 10/21 -Nephrology consulted and appreciated -IR placed HD cath and right nephrostomy tube 10/22 -UOP 2495cc over past 24hrs -Continue to monitor creatinine -On 04/15/2015, the patient was given a fluid challenge with improvement of her serum creatinine -04/16/2015--case discussed with nephrology, Dr. Mattingly--cleared the patient for discharge -Serum creatinine 4.77 on the day of discharge  Pyelonephritis/UTI? -CT abdomen and pelvis noted perinephric stranding -Patient has had some back pain currently afebrile, WBCs normalized. -urine culture showed multiple species present -Patient received 7 days of Rocephin--last day on 10/23 -Blood cultures show no growth to date  -Remained afebrile and hemodynamically stable off antibiotics 4 days  Nephroureterolithiasis/right hydronephrosis -Patient did have stent placed by Dr. Tannenbaum--02/26/2015 -Patient had right perc placed by IR 10/22 -Home health will be set up to assist the patient with flushing her nephrostomy tube  -Dr. Mercy Moore setting up flushing schedule for HD catheter in short stay  Nausea -Likely secondary to uremia- has improved -Tolerating diet -Continue antiemetics as needed   Anemia of chronic disease -Hemoglobin currently 6.9 -dose of feraheme given and unit PRBC 04/13/2015 -started on aranesp by nephrology  Hypertension -Stable, metoprolol and amlodipine held    -Given the patient's previous history of atrial fibrillation, the patient was previously taking metoprolol tartrate 25 mg daily -She will be discharged home with metoprolol tartrate 12.5 mg daily given her soft pressures -She was instructed to follow-up with her primary care provider for blood pressure check  Headache (frontal) -Improving, Continue pain control and saline nasal spray -Resolved Hypokalemia -Resolved, continue to monitor BMP Hyponatremia -secondary to renal failure--stable  Code Status: Full   Procedures  Renal Scan IR placement of RIJ HD cath IR Insertion of right PCN  Consults  Nephrology Interventional radiology Vascular surgery Urology  Discharge Condition: stable  Disposition: home  Diet: renal Wt Readings from Last 3 Encounters:  04/13/15 83.8 kg (184 lb 11.9 oz)  03/01/15 90.357 kg (199 lb 3.2 oz)  11/24/14 100.2 kg (220 lb 14.4 oz)    History of present illness:  69 y.o. female  Patient was seen today by her primary care physician who drew routine labs admitting: The patient to report elevated creatinine and ask her to go to the emergency room for further evaluation. With the exception of one-week history of intermittent nausea, dysuria, and left lower back pain patient states she's been in her normal state of health and had no inkling of any underlying illness or recent echo the emergency room. The patient was noted to have new onset right-sided hydronephrosis on CT with a ureteral stones in September 2016. She was taken and underwent cystoscopy with right ureteral stent on 02/26/2015. At the time of admission, the patient was noted to have a serum creatinine of 8. The patient was seen by nephrology. Tunneled dialysis catheter was placed during the admission. The patient underwent hemodialysis on 04/10/2015. Urology was consulted. Dr. Era Bumpers saw the patient. Ultimately, the patient underwent a CT abdomen and pelvis on 04/06/2015 which revealed a  stable right ureteral stent with mild right hydronephrosis and minimal left-sided hydronephrosis. She underwent a right-sided nephrostomy tube placement performed by interventional radiology on 04/11/2015. After dialysis, the patient's renal function was trended. Ultimately, her renal function stabilized and was 4.77 on the day of discharge. The patient will be discharged with follow-up with nephrology.      Discharge Exam: Filed Vitals:   04/16/15 0449  BP: 118/55  Pulse: 81  Temp: 97.9 F (36.6 C)  Resp: 17   Filed Vitals:   04/15/15 0433 04/15/15 1714 04/15/15 2109 04/16/15 0449  BP: 101/51 117/54 109/44 118/55  Pulse: 82 80 87 81  Temp: 98.5 F (36.9 C) 98.1 F (36.7 C) 98.4 F (36.9 C) 97.9 F (36.6 C)  TempSrc: Oral Oral Oral Oral  Resp: 16 18 16 17   Height:      Weight:      SpO2: 92% 94% 97% 92%   General: A&O x 3, NAD, pleasant, cooperative Cardiovascular: RRR, no rub, no gallop, no S3 Respiratory: CTAB, no wheeze, no rhonchi Abdomen:soft, nontender, nondistended, positive bowel sounds Extremities: trace LE edema, No lymphangitis, no petechiae  Discharge Instructions      Discharge Instructions    Diet - low sodium heart healthy    Complete by:  As directed      Increase activity slowly    Complete by:  As directed             Medication List    STOP taking these medications        amLODipine 5 MG tablet  Commonly known as:  NORVASC     cephALEXin 500 MG capsule  Commonly known as:  KEFLEX      TAKE these medications        acetaminophen 500 MG tablet  Commonly known as:  TYLENOL  Take 500 mg by mouth every 6 (six) hours as needed for moderate pain.     bimatoprost 0.01 % Soln  Commonly known as:  LUMIGAN  Place 1 drop into both eyes at bedtime.     metoprolol tartrate 25 MG tablet  Commonly known as:  LOPRESSOR  Take 0.5 tablets (12.5 mg total) by mouth 2 (two) times daily.     ondansetron 4 MG disintegrating tablet  Commonly known  as:  ZOFRAN ODT  Take 1 tablet (4 mg total) by mouth every 8 (eight) hours as needed for nausea or vomiting.     ranitidine 150 MG tablet  Commonly known as:  ZANTAC  Take 150 mg by mouth 2 (two) times daily as needed for heartburn.     timolol 0.5 % ophthalmic solution  Commonly known as:  BETIMOL  Place 1 drop into both eyes every morning.         The results of significant diagnostics from this hospitalization (including imaging, microbiology, ancillary and laboratory) are listed below for reference.    Significant Diagnostic Studies: Nm Renal Imaging Flow W/pharm  04/08/2015  CLINICAL DATA:  Acute kidney disease. History of left renal atrophy and chronic right hydronephrosis. EXAM: NUCLEAR MEDICINE RENAL SCAN WITH DIURETIC ADMINISTRATION TECHNIQUE: Radionuclide angiographic and sequential renal images were obtained after intravenous injection of radiopharmaceutical. Imaging was continued during slow intravenous injection of Lasix approximately 15 minutes after the start of the examination. RADIOPHARMACEUTICALS:  13.9 mCi Technetium-66m MAG3 IV COMPARISON:  None. FINDINGS: Flow: Decreased perfusion to the smaller left kidney. Normal perfusion to the right kidney. Left renogram: Diminished cortical uptake by the small left kidney. There  is delayed excretion of the radiopharmaceutical. Right renogram: Normal cortical uptake. There is diminished clearance of the radiopharmaceutical from the dilated right renal collecting system. Differential: Left kidney = 28 % Right kidney = 72 % T1/2 post Lasix : Left kidney = not achieved min Right kidney = not achieved. Min IMPRESSION: 1. Diminished left renal function. There is delayed perfusion, cortical uptake and excretion of the radiopharmaceutical by the small left kidney. 2. Delayed and diminished clearance of the radiopharmaceutical from the dilated right renal collecting system compatible with partial obstruction. 3. Split renal function is equal to  72% from the right kidney and 28% from the left kidney. Electronically Signed   By: Kerby Moors M.D.   On: 04/08/2015 16:34   US Renal  04/11/2015  CLINICAL DATA:  Hydronephrosis, poorly functioning right ureteral stent, currently on dialysis, elevated creatinine EXAM: RENAL / URINARY TRACT ULTRASOUND COMPLETE COMPARISON:  04/06/2015 CT FINDINGS: Right Kidney: Length: 11.5 cm. Normal echogenicity and cortical thickness. Ureteral stent extends into the upper pole by ultrasound. Moderate hydronephrosis evident. No focal abnormality. Left Kidney: Length: 9.5 cm. Mild cortical thinning but normal echogenicity. Mild left hydronephrosis and proximal hydroureter. Bladder: Appears normal for degree of bladder distention. IMPRESSION: Right greater than left hydronephrosis despite right ureteral stent insertion Electronically Signed   By: Jerilynn Mages.  Shick M.D.   On: 04/11/2015 13:53   Ir Fluoro Guide Cv Line Right  04/10/2015  CLINICAL DATA:  End-stage renal disease, hypertension, no current access to begin dialysis. EXAM: ULTRASOUND GUIDANCE FOR VASCULAR ACCESS RIGHT INTERNAL JUGULAR PERMANENT HEMODIALYSIS CATHETER Date:  10/21/201610/21/2016 10:12 am Radiologist:  M. Daryll Brod, MD Guidance:  Ultrasound and fluoroscopic FLUOROSCOPY TIME:  30 seconds, 10.7 mGy MEDICATIONS AND MEDICAL HISTORY: 2 g Ancef administered within 1 hour of the procedure, 1 mg Versed, 50 mcg fentanyl ANESTHESIA/SEDATION: 15 minutes CONTRAST:  None. COMPLICATIONS: None immediate PROCEDURE: Informed consent was obtained from the patient following explanation of the procedure, risks, benefits and alternatives. The patient understands, agrees and consents for the procedure. All questions were addressed. A time out was performed. Maximal barrier sterile technique utilized including caps, mask, sterile gowns, sterile gloves, large sterile drape, hand hygiene, and 2% chlorhexidine scrub. Under sterile conditions and local anesthesia, right internal  jugular micropuncture venous access was performed with ultrasound. Images were obtained for documentation. A guide wire was inserted followed by a transitional dilator. Next, a 0.035 guidewire was advanced into the IVC with a 5-French catheter. Measurements were obtained from the right venotomy site to the proximal right atrium. In the right infraclavicular chest, a subcutaneous tunnel was created under sterile conditions and local anesthesia. 1% lidocaine with epinephrine was utilized for this. The 19 cm tip to cuff palindrome catheter was tunneled subcutaneously to the venotomy site and inserted into the SVC/RA junction through a valved peel-away sheath. Position was confirmed with fluoroscopy. Images were obtained for documentation. Blood was aspirated from the catheter followed by saline and heparin flushes. The appropriate volume and strength of heparin was instilled in each lumen. Caps were applied. The catheter was secured at the tunnel site with Gelfoam and a pursestring suture. The venotomy site was closed with subcuticular Vicryl suture. Dermabond was applied to the small right neck incision. A dry sterile dressing was applied. The catheter is ready for use. No immediate complications. IMPRESSION: Ultrasound and fluoroscopically guided right internal jugular tunneled hemodialysis catheter (Palindrome catheter). Electronically Signed   By: Jerilynn Mages.  Shick M.D.   On: 04/10/2015 11:03   Ir  US Guide Vasc Access Right  04/10/2015  CLINICAL DATA:  End-stage renal disease, hypertension, no current access to begin dialysis. EXAM: ULTRASOUND GUIDANCE FOR VASCULAR ACCESS RIGHT INTERNAL JUGULAR PERMANENT HEMODIALYSIS CATHETER Date:  10/21/201610/21/2016 10:12 am Radiologist:  M. Daryll Brod, MD Guidance:  Ultrasound and fluoroscopic FLUOROSCOPY TIME:  30 seconds, 10.7 mGy MEDICATIONS AND MEDICAL HISTORY: 2 g Ancef administered within 1 hour of the procedure, 1 mg Versed, 50 mcg fentanyl ANESTHESIA/SEDATION: 15 minutes  CONTRAST:  None. COMPLICATIONS: None immediate PROCEDURE: Informed consent was obtained from the patient following explanation of the procedure, risks, benefits and alternatives. The patient understands, agrees and consents for the procedure. All questions were addressed. A time out was performed. Maximal barrier sterile technique utilized including caps, mask, sterile gowns, sterile gloves, large sterile drape, hand hygiene, and 2% chlorhexidine scrub. Under sterile conditions and local anesthesia, right internal jugular micropuncture venous access was performed with ultrasound. Images were obtained for documentation. A guide wire was inserted followed by a transitional dilator. Next, a 0.035 guidewire was advanced into the IVC with a 5-French catheter. Measurements were obtained from the right venotomy site to the proximal right atrium. In the right infraclavicular chest, a subcutaneous tunnel was created under sterile conditions and local anesthesia. 1% lidocaine with epinephrine was utilized for this. The 19 cm tip to cuff palindrome catheter was tunneled subcutaneously to the venotomy site and inserted into the SVC/RA junction through a valved peel-away sheath. Position was confirmed with fluoroscopy. Images were obtained for documentation. Blood was aspirated from the catheter followed by saline and heparin flushes. The appropriate volume and strength of heparin was instilled in each lumen. Caps were applied. The catheter was secured at the tunnel site with Gelfoam and a pursestring suture. The venotomy site was closed with subcuticular Vicryl suture. Dermabond was applied to the small right neck incision. A dry sterile dressing was applied. The catheter is ready for use. No immediate complications. IMPRESSION: Ultrasound and fluoroscopically guided right internal jugular tunneled hemodialysis catheter (Palindrome catheter). Electronically Signed   By: Jerilynn Mages.  Shick M.D.   On: 04/10/2015 11:03   Ct Renal Stone  Study  04/06/2015  CLINICAL DATA:  Acute onset of renal failure. Nausea. Recent placement of right ureteral stent for obstructing ureteral stone. Initial encounter. EXAM: CT ABDOMEN AND PELVIS WITHOUT CONTRAST TECHNIQUE: Multidetector CT imaging of the abdomen and pelvis was performed following the standard protocol without IV contrast. COMPARISON:  CT of the abdomen and pelvis performed 02/20/2015, and renal ultrasound performed 02/27/2015 FINDINGS: The visualized lung bases are clear. A large hiatal hernia is noted. Mild calcification is noted at the mitral valve. The liver and spleen are unremarkable in appearance. The patient is status post cholecystectomy, with clips noted at the gallbladder fossa. The pancreas and adrenal glands are unremarkable. There is mild right-sided and minimal left-sided hydronephrosis, with diffuse wall thickening at the renal calyces and pelves, extending along both ureters, and mild perinephric stranding. Findings are concerning for pyelonephritis and ureteritis. A right ureteral stent is noted in expected position. No obstructing ureteral stones are seen. There is wall thickening about the bladder, with associated soft tissue inflammation, reflecting underlying cystitis. No free fluid is identified. The small bowel is unremarkable in appearance. The stomach is within normal limits. No acute vascular abnormalities are seen. The appendix is normal in caliber, without evidence of appendicitis. Mild scattered diverticulosis is noted along the transverse, descending and proximal sigmoid colon, without evidence of diverticulitis. There is a relatively broad-based moderate  anterior abdominal wall hernia extending into the pannus, containing only fat. A small periumbilical hernia is also noted, to the right of the umbilicus, containing only fat. The patient is status post hysterectomy. No suspicious adnexal masses are seen. No inguinal lymphadenopathy is seen. No acute osseous  abnormalities are identified. There is grade 1 retrolisthesis of L2 on L3, with associated vacuum phenomenon and endplate sclerotic change, and minimal grade 1 retrolisthesis of L3 on L4. IMPRESSION: 1. Mild right-sided and minimal left-sided hydronephrosis, with diffuse wall thickening at the renal calyces and pelves, extending along both ureters, and mild perinephric stranding. Findings concerning for bilateral pyelonephritis and ureteritis, somewhat more prominent than in September. Right ureteral stent noted in expected position. No obstructing ureteral stone seen. 2. Diffuse bladder wall thickening, with associated soft tissue inflammation, concerning for underlying cystitis. 3. Large hiatal hernia noted. 4. Mild scattered diverticulosis along the transverse, descending and proximal sigmoid colon, without evidence of diverticulitis. 5. Relatively broad-based moderate anterior abdominal wall hernia extending into the pannus, containing only fat. Small periumbilical hernia to the right of the umbilicus, also containing only fat. 6. Mild degenerative change at the mid lumbar spine. Electronically Signed   By: Garald Balding M.D.   On: 04/06/2015 22:21   Ir Nephrostomy Placement Right  04/11/2015  CLINICAL DATA:  PERSISTENT RIGHT HYDRONEPHROSIS DESPITE URETERAL STENT INSERTION. RENAL FAILURE, ELEVATED CREATININE. EXAM: ULTRASOUND FLUOROSCOPIC 10 FRENCH RIGHT NEPHROSTOMY INSERTION Date:  10/22/201610/22/2016 2:23 pm Radiologist:  M. Daryll Brod, MD Guidance:  Ultrasound and fluoroscopic FLUOROSCOPY TIME:  1 minutes 12 seconds, 60 mGy MEDICATIONS AND MEDICAL HISTORY: Patient is already receiving 1 g Rocephin every 24 hours. 1.5 mg Versed, 75 mcg fentanyl ANESTHESIA/SEDATION: 10 minutes CONTRAST:  16mL OMNIPAQUE IOHEXOL 300 MG/ML  SOLN COMPLICATIONS: None immediate PROCEDURE: Informed consent was obtained from the patient following explanation of the procedure, risks, benefits and alternatives. The patient  understands, agrees and consents for the procedure. All questions were addressed. A time out was performed. Maximal barrier sterile technique utilized including caps, mask, sterile gowns, sterile gloves, large sterile drape, hand hygiene, and ChloraPrep. Under sterile conditions and local anesthesia, ultrasound micropuncture needle access performed of a right mid to lower pole dilated calyx. Needle position confirmed with ultrasound. Images obtained for documentation. There was return of cloudy exudative urine. Guidewire advanced easily into the renal pelvis. Accustick dilator set advanced. Amplatz guidewire exchange performed. Tract dilatation performed to advance a 10 French nephrostomy with the retention loop formed in the renal pelvis. Contrast injection confirms position. Gravity drainage bag connected. Catheter secured with a Prolene suture and a sterile dressing. No immediate complication. Patient tolerated the procedure well. IMPRESSION: Successful ultrasound and fluoroscopic 10 French right nephrostomy insertion. Electronically Signed   By: Jerilynn Mages.  Shick M.D.   On: 04/11/2015 14:33     Microbiology: Recent Results (from the past 240 hour(s))  Urine culture     Status: None   Collection Time: 04/06/15  9:46 PM  Result Value Ref Range Status   Specimen Description URINE, CLEAN CATCH  Final   Special Requests NONE  Final   Culture   Final    MULTIPLE SPECIES PRESENT, SUGGEST RECOLLECTION Performed at [facility]    Report Status 04/08/2015 FINAL  Final  Culture, blood (routine x 2)     Status: None   Collection Time: 04/07/15  1:55 AM  Result Value Ref Range Status   Specimen Description BLOOD LEFT ARM  Final   Special Requests BOTTLES DRAWN AEROBIC  AND ANAEROBIC 10CC  Final   Culture   Final    NO GROWTH 5 DAYS Performed at [facility]    Report Status 04/12/2015 FINAL  Final  Culture, blood (routine x 2)     Status: None   Collection Time: 04/07/15  2:00 AM  Result  Value Ref Range Status   Specimen Description BLOOD LEFT HAND  Final   Special Requests BOTTLES DRAWN AEROBIC AND ANAEROBIC 10CC  Final   Culture   Final    NO GROWTH 5 DAYS Performed at [facility]    Report Status 04/12/2015 FINAL  Final     Labs: Basic Metabolic Panel:  Recent Labs Lab 04/12/15 0327 04/13/15 0451 04/14/15 0729 04/15/15 0342 04/16/15 0500  NA 133* 137 132* 131* 134*  K 4.4 3.7 3.8 3.5 3.6  CL 91* 90* 88* 88* 96*  CO2 34* 38* 35* 33* 31  GLUCOSE 108* 113* 105* 101* 86  BUN 22* 22* 22* 25* 27*  CREATININE 4.26* 4.44* 4.82* 4.98* 4.77*  CALCIUM 7.8* 8.5* 8.8* 8.4* 8.1*  PHOS 2.7 2.6 3.0 2.7 3.9   Liver Function Tests:  Recent Labs Lab 04/12/15 0327 04/13/15 0451 04/14/15 0729 04/15/15 0342 04/16/15 0500  ALBUMIN 2.0* 1.9* 1.9* 1.9* 1.8*   No results for input(s): LIPASE, AMYLASE in the last 168 hours. No results for input(s): AMMONIA in the last 168 hours. CBC:  Recent Labs Lab 04/11/15 1450 04/12/15 0327 04/13/15 0451 04/14/15 0729 04/15/15 0342  WBC 6.9 7.2 6.5 7.4 6.1  HGB 8.1* 7.1* 6.9* 8.0* 7.5*  HCT 27.4* 23.7* 23.2* 26.3* 22.8*  MCV 89.0 88.8 89.6 87.7 92.7  PLT 367 294 266 252 167   Cardiac Enzymes: No results for input(s): CKTOTAL, CKMB, CKMBINDEX, TROPONINI in the last 168 hours. BNP: Invalid input(s): POCBNP CBG: No results for input(s): GLUCAP in the last 168 hours.  Time coordinating discharge:  Greater than 30 minutes  Signed:  Freedom Lopezperez, DO [facility] Pager: [phone] 04/16/2015, 9:39 AM

## 2015-04-16 NOTE — Progress Notes (Signed)
S:  Feels well. Up walking O:BP 118/55 mmHg  Pulse 81  Temp(Src) 97.9 F (36.6 C) (Oral)  Resp 17  Ht 5\' 3"  (1.6 m)  Wt 83.8 kg (184 lb 11.9 oz)  BMI 32.73 kg/m2  SpO2 92%  LMP 01/19/1992  Intake/Output Summary (Last 24 hours) at 04/16/15 0857 Last data filed at 04/16/15 [id number]  Gross per 24 hour  Intake   2310 ml  Output   2495 ml  Net   -185 ml   Weight change:  [id number] and alert CVS:RRR Resp:clear Abd:+ BS NTND Ext:no edema NEURO:CNI Ox3 no asterixis Rt Permcath Rt PCN   . sodium chloride   Intravenous Once  . darbepoetin (ARANESP) injection - NON-DIALYSIS  100 mcg Subcutaneous Q Tue-1800  . famotidine  10 mg Oral BID AC  . feeding supplement (NEPRO CARB STEADY)  237 mL Oral BID BM  . heparin  5,000 Units Subcutaneous 3 times per day  . latanoprost  1 drop Both Eyes QHS  . saccharomyces boulardii  250 mg Oral BID  . timolol  1 drop Both Eyes q morning - 10a   No results found. BMET    Component Value Date/Time   NA 134* 04/16/2015 0500   K 3.6 04/16/2015 0500   CL 96* 04/16/2015 0500   CO2 31 04/16/2015 0500   GLUCOSE 86 04/16/2015 0500   BUN 27* 04/16/2015 0500   CREATININE 4.77* 04/16/2015 0500   CALCIUM 8.1* 04/16/2015 0500   GFRNONAA 8* 04/16/2015 0500   GFRAA 10* 04/16/2015 0500   CBC    Component Value Date/Time   WBC 6.1 04/15/2015 0342   RBC 2.46* 04/15/2015 0342   RBC 2.71* 04/08/2015 0915   HGB 7.5* 04/15/2015 0342   HCT 22.8* 04/15/2015 0342   HCT 23.6* 04/08/2015 0545   PLT 167 04/15/2015 0342   MCV 92.7 04/15/2015 0342   MCH 30.5 04/15/2015 0342   MCHC 32.9 04/15/2015 0342   RDW 15.3 04/15/2015 0342   LYMPHSABS 1.0 04/08/2015 0915   MONOABS 0.4 04/08/2015 0915   EOSABS 0.3 04/08/2015 0915   BASOSABS 0.0 04/08/2015 0915     Assessment:  1. Acute on CKD 3/4 sec to recurrent obstruction.  Now with Rt perc.  Scr sl lower today 2. Anemia on aranesp. Received 1 dose IV iron 3. Hx HTN  Plan: 1. She can go from my standpoint.   Will schedule labs for next Mon at [place] (done) 2.  If she has [facility] coming to home then need to see if they can flush permcath weekly, if not, need to arrange it to be done at [facility] 3.  She has not been seen by urology all week??  They need to follow her up 4.  Will arrange FU with Dr Florene Glen as outpt.  Emaree Chiu T

## 2015-04-22 ENCOUNTER — Other Ambulatory Visit (HOSPITAL_COMMUNITY): Payer: Self-pay | Admitting: *Deleted

## 2015-04-23 ENCOUNTER — Encounter (HOSPITAL_COMMUNITY)
Admission: RE | Admit: 2015-04-23 | Discharge: 2015-04-23 | Disposition: A | Discharge status: Home / Self Care | Payer: Medicare Other | Source: Ambulatory Visit | Attending: Nephrology | Admitting: Nephrology

## 2015-04-23 DIAGNOSIS — D631 Anemia in chronic kidney disease: Secondary | ICD-10-CM | POA: Insufficient documentation

## 2015-04-23 DIAGNOSIS — Z5181 Encounter for therapeutic drug level monitoring: Secondary | ICD-10-CM | POA: Insufficient documentation

## 2015-04-23 DIAGNOSIS — N184 Chronic kidney disease, stage 4 (severe): Secondary | ICD-10-CM | POA: Insufficient documentation

## 2015-04-23 DIAGNOSIS — Z79899 Other long term (current) drug therapy: Secondary | ICD-10-CM | POA: Insufficient documentation

## 2015-04-23 LAB — POCT HEMOGLOBIN-HEMACUE: Hemoglobin: 9.1 g/dL — ABNORMAL LOW (ref 12.0–15.0)

## 2015-04-23 MED ORDER — DARBEPOETIN ALFA 60 MCG/0.3ML IJ SOSY
PREFILLED_SYRINGE | INTRAMUSCULAR | Status: AC
  Filled 2015-04-23: qty 0.3

## 2015-04-23 MED ORDER — HEPARIN SODIUM (PORCINE) 1000 UNIT/ML IJ SOLN
1000.0000 [IU] | Freq: Once | INTRAMUSCULAR | Status: AC
  Administered 2015-04-23: 1000 [IU] via INTRAVENOUS
  Filled 2015-04-23: qty 1

## 2015-04-23 MED ORDER — CLONIDINE HCL 0.1 MG PO TABS: 0.1000 mg | ORAL_TABLET | Freq: Once | ORAL | Status: DC | PRN

## 2015-04-23 MED ORDER — HEPARIN SOD (PORK) LOCK FLUSH 100 UNIT/ML IV SOLN
INTRAVENOUS | Status: AC
  Filled 2015-04-23: qty 5

## 2015-04-23 MED ORDER — HEPARIN SOD (PORK) LOCK FLUSH 100 UNIT/ML IV SOLN: 500.0000 [IU] | INTRAVENOUS | Status: DC

## 2015-04-23 MED ORDER — HEPARIN SOD (PORK) LOCK FLUSH 100 UNIT/ML IV SOLN: 500.0000 [IU] | INTRAVENOUS | Status: DC | PRN

## 2015-04-23 MED ORDER — DARBEPOETIN ALFA 60 MCG/0.3ML IJ SOSY
60.0000 ug | PREFILLED_SYRINGE | INTRAMUSCULAR | Status: DC
  Administered 2015-04-23: 60 ug via SUBCUTANEOUS

## 2015-04-27 NOTE — Patient Instructions (Addendum)
[name]  04/27/2015   Your procedure is scheduled on: 05/04/2015    Report to [facility] Main  Entrance take [place]  elevators to 3rd floor to  [place] at     [id number] AM.  Call this number if you have problems the morning of surgery [phone]   Remember: ONLY 1 PERSON MAY GO WITH YOU TO SHORT STAY TO GET  READY MORNING OF [place].  Do not eat food or drink liquids :After Midnight.     Take these medicines the morning of surgery with A SIP OF WATER:   Metoprolol ( Lopressor), Zantac, Ocean nasal spray if needed, Betimol eye drops                                 You may not have any metal on your body including hair pins and              piercings  Do not wear jewelry, make-up, lotions, powders or perfumes, deodorant             Do not wear nail polish.  Do not shave  48 hours prior to surgery.         Do not bring valuables to the hospital. [place].  Contacts, dentures or bridgework may not be worn into surgery.  Leave suitcase in the car. After surgery it may be brought to your room.     Special Instructions: coughing and deep breathing exercises, leg exercises               Please read over the following fact sheets you were given: _____________________________________________________________________             [facility] - Preparing for Surgery Before surgery, you can play an important role.  Because skin is not sterile, your skin needs to be as free of germs as possible.  You can reduce the number of germs on your skin by washing with CHG (chlorahexidine gluconate) soap before surgery.  CHG is an antiseptic cleaner which kills germs and bonds with the skin to continue killing germs even after washing. Please DO NOT use if you have an allergy to CHG or antibacterial soaps.  If your skin becomes reddened/irritated stop using the CHG and inform your nurse when you arrive at Short  Stay. Do not shave (including legs and underarms) for at least 48 hours prior to the first CHG shower.  You may shave your face/neck. Please follow these instructions carefully:  1.  Shower with CHG Soap the night before surgery and the  morning of Surgery.  2.  If you choose to wash your hair, wash your hair first as usual with your  normal  shampoo.  3.  After you shampoo, rinse your hair and body thoroughly to remove the  shampoo.                           4.  Use CHG as you would any other liquid soap.  You can apply chg directly  to the skin and wash  Gently with a scrungie or clean washcloth.  5.  Apply the CHG Soap to your body ONLY FROM THE NECK DOWN.   Do not use on face/ open                           Wound or open sores. Avoid contact with eyes, ears mouth and genitals (private parts).                       Wash face,  Genitals (private parts) with your normal soap.             6.  Wash thoroughly, paying special attention to the area where your surgery  will be performed.  7.  Thoroughly rinse your body with warm water from the neck down.  8.  DO NOT shower/wash with your normal soap after using and rinsing off  the CHG Soap.                9.  Pat yourself dry with a clean towel.            10.  Wear clean pajamas.            11.  Place clean sheets on your bed the night of your first shower and do not  sleep with pets. Day of Surgery : Do not apply any lotions/deodorants the morning of surgery.  Please wear clean clothes to the hospital/surgery center.  FAILURE TO FOLLOW THESE INSTRUCTIONS MAY RESULT IN THE CANCELLATION OF YOUR SURGERY PATIENT SIGNATURE_________________________________  NURSE SIGNATURE__________________________________  ________________________________________________________________________

## 2015-04-28 ENCOUNTER — Encounter (HOSPITAL_COMMUNITY): Payer: Self-pay

## 2015-04-28 ENCOUNTER — Encounter (HOSPITAL_COMMUNITY)
Admission: RE | Admit: 2015-04-28 | Discharge: 2015-04-28 | Disposition: A | Discharge status: Home / Self Care | Payer: Medicare Other | Source: Ambulatory Visit | Attending: Urology | Admitting: Urology

## 2015-04-28 DIAGNOSIS — N1330 Unspecified hydronephrosis: Secondary | ICD-10-CM | POA: Diagnosis not present

## 2015-04-28 DIAGNOSIS — Z01818 Encounter for other preprocedural examination: Secondary | ICD-10-CM | POA: Diagnosis not present

## 2015-04-28 HISTORY — DX: Other complications of anesthesia, initial encounter: T88.59XA

## 2015-04-28 HISTORY — DX: Adverse effect of unspecified anesthetic, initial encounter: T41.45XA

## 2015-04-28 HISTORY — DX: Personal history of other medical treatment: Z92.89

## 2015-04-28 HISTORY — DX: Dependence on renal dialysis: Z99.2

## 2015-04-28 HISTORY — DX: Reserved for concepts with insufficient information to code with codable children: IMO0002

## 2015-04-28 LAB — CBC
HCT: 36.5 % (ref 36.0–46.0)
Hemoglobin: 11.1 g/dL — ABNORMAL LOW (ref 12.0–15.0)
MCH: 28 pg (ref 26.0–34.0)
MCHC: 30.4 g/dL (ref 30.0–36.0)
MCV: 91.9 fL (ref 78.0–100.0)
Platelets: 394 10*3/uL (ref 150–400)
RBC: 3.97 MIL/uL (ref 3.87–5.11)
RDW: 17.5 % — ABNORMAL HIGH (ref 11.5–15.5)
WBC: 9.6 10*3/uL (ref 4.0–10.5)

## 2015-04-28 LAB — BASIC METABOLIC PANEL
Anion gap: 10 (ref 5–15)
BUN: 64 mg/dL — ABNORMAL HIGH (ref 6–20)
CO2: 26 mmol/L (ref 22–32)
Calcium: 10.7 mg/dL — ABNORMAL HIGH (ref 8.9–10.3)
Chloride: 104 mmol/L (ref 101–111)
Creatinine, Ser: 4.81 mg/dL — ABNORMAL HIGH (ref 0.44–1.00)
GFR calc Af Amer: 10 mL/min — ABNORMAL LOW (ref >60)
GFR calc non Af Amer: 8 mL/min — ABNORMAL LOW (ref >60)
Glucose, Bld: 82 mg/dL (ref 65–99)
Potassium: 4.8 mmol/L (ref 3.5–5.1)
Sodium: 140 mmol/L (ref 135–145)

## 2015-04-28 NOTE — Progress Notes (Signed)
Gwynn in Triage at office stated Dr Gaynelle Arabian was in office today on 04/28/2015 and stated Dr Gaynelle Arabian would see the BMP results I faxed via EPIC to office on 04/28/2015.

## 2015-04-28 NOTE — Progress Notes (Signed)
Bmp DONE 04/28/2015 FAXED VIA epic TO Dr Gaynelle Arabian .

## 2015-04-28 NOTE — Progress Notes (Signed)
Patient currently receiving aranesp injections on thursdays.  Patient started Aranesp treatments on 04/16/2015 weekly at [facility]. They also flush dialysis catheter per patient.

## 2015-04-28 NOTE — Progress Notes (Signed)
Called back to office of [facility] to see if Dr Gaynelle Arabian had noted BMP results sent from earlier today.  Robin at Triage stated that [facility] had sent a note to Dr Gaynelle Arabian to note BMP results from 04/28/2015 that I had sent via EPIC.  Shirlean Mylar also stated Dr Gaynelle Arabian was in office today.

## 2015-04-28 NOTE — Progress Notes (Addendum)
EKG- 02/28/15- epic  ECHO- 11/23/2014 EPIC  02/20/2015- CXR- EPIC  LOV with cardiology - 11/2014- EPIC

## 2015-04-30 ENCOUNTER — Encounter (HOSPITAL_COMMUNITY)
Admission: RE | Admit: 2015-04-30 | Discharge: 2015-04-30 | Disposition: A | Discharge status: Home / Self Care | Payer: Medicare Other | Source: Ambulatory Visit | Attending: Nephrology | Admitting: Nephrology

## 2015-04-30 DIAGNOSIS — N184 Chronic kidney disease, stage 4 (severe): Secondary | ICD-10-CM | POA: Diagnosis not present

## 2015-04-30 LAB — POCT HEMOGLOBIN-HEMACUE: Hemoglobin: 10.4 g/dL — ABNORMAL LOW (ref 12.0–15.0)

## 2015-04-30 MED ORDER — DARBEPOETIN ALFA 60 MCG/0.3ML IJ SOSY
PREFILLED_SYRINGE | INTRAMUSCULAR | Status: AC
  Filled 2015-04-30: qty 0.3

## 2015-04-30 MED ORDER — DARBEPOETIN ALFA 60 MCG/0.3ML IJ SOSY
60.0000 ug | PREFILLED_SYRINGE | INTRAMUSCULAR | Status: DC
  Administered 2015-04-30: 60 ug via SUBCUTANEOUS

## 2015-04-30 MED ORDER — HEPARIN SODIUM (PORCINE) 1000 UNIT/ML IJ SOLN
1000.0000 [IU] | Freq: Once | INTRAMUSCULAR | Status: AC
  Administered 2015-04-30: 1000 [IU] via INTRAVENOUS
  Filled 2015-04-30: qty 1

## 2015-04-30 MED ORDER — CLONIDINE HCL 0.1 MG PO TABS: 0.1000 mg | ORAL_TABLET | Freq: Once | ORAL | Status: DC | PRN

## 2015-05-02 ENCOUNTER — Encounter (HOSPITAL_COMMUNITY): Payer: Self-pay

## 2015-05-02 ENCOUNTER — Inpatient Hospital Stay (HOSPITAL_COMMUNITY)
Admission: EM | Admit: 2015-05-02 | Discharge: 2015-05-07 | DRG: 854 | Disposition: A | Discharge status: Home Health | Payer: Medicare Other | Attending: Internal Medicine | Admitting: Internal Medicine

## 2015-05-02 ENCOUNTER — Emergency Department (HOSPITAL_COMMUNITY): Payer: Medicare Other

## 2015-05-02 ENCOUNTER — Other Ambulatory Visit: Payer: Self-pay

## 2015-05-02 DIAGNOSIS — I739 Peripheral vascular disease, unspecified: Secondary | ICD-10-CM | POA: Diagnosis present

## 2015-05-02 DIAGNOSIS — Z90710 Acquired absence of both cervix and uterus: Secondary | ICD-10-CM

## 2015-05-02 DIAGNOSIS — N2889 Other specified disorders of kidney and ureter: Secondary | ICD-10-CM | POA: Diagnosis present

## 2015-05-02 DIAGNOSIS — Z885 Allergy status to narcotic agent status: Secondary | ICD-10-CM | POA: Diagnosis not present

## 2015-05-02 DIAGNOSIS — Z79891 Long term (current) use of opiate analgesic: Secondary | ICD-10-CM

## 2015-05-02 DIAGNOSIS — Z800 Family history of malignant neoplasm of digestive organs: Secondary | ICD-10-CM

## 2015-05-02 DIAGNOSIS — Z79899 Other long term (current) drug therapy: Secondary | ICD-10-CM | POA: Diagnosis not present

## 2015-05-02 DIAGNOSIS — Z8249 Family history of ischemic heart disease and other diseases of the circulatory system: Secondary | ICD-10-CM

## 2015-05-02 DIAGNOSIS — N136 Pyonephrosis: Secondary | ICD-10-CM | POA: Diagnosis present

## 2015-05-02 DIAGNOSIS — D638 Anemia in other chronic diseases classified elsewhere: Secondary | ICD-10-CM | POA: Diagnosis present

## 2015-05-02 DIAGNOSIS — I2510 Atherosclerotic heart disease of native coronary artery without angina pectoris: Secondary | ICD-10-CM | POA: Diagnosis present

## 2015-05-02 DIAGNOSIS — K219 Gastro-esophageal reflux disease without esophagitis: Secondary | ICD-10-CM | POA: Diagnosis present

## 2015-05-02 DIAGNOSIS — Z992 Dependence on renal dialysis: Secondary | ICD-10-CM

## 2015-05-02 DIAGNOSIS — Z8052 Family history of malignant neoplasm of bladder: Secondary | ICD-10-CM | POA: Diagnosis not present

## 2015-05-02 DIAGNOSIS — E785 Hyperlipidemia, unspecified: Secondary | ICD-10-CM | POA: Diagnosis present

## 2015-05-02 DIAGNOSIS — R32 Unspecified urinary incontinence: Secondary | ICD-10-CM | POA: Diagnosis not present

## 2015-05-02 DIAGNOSIS — I252 Old myocardial infarction: Secondary | ICD-10-CM | POA: Diagnosis not present

## 2015-05-02 DIAGNOSIS — Z87442 Personal history of urinary calculi: Secondary | ICD-10-CM

## 2015-05-02 DIAGNOSIS — I959 Hypotension, unspecified: Secondary | ICD-10-CM | POA: Diagnosis present

## 2015-05-02 DIAGNOSIS — R944 Abnormal results of kidney function studies: Secondary | ICD-10-CM | POA: Diagnosis present

## 2015-05-02 DIAGNOSIS — Z833 Family history of diabetes mellitus: Secondary | ICD-10-CM

## 2015-05-02 DIAGNOSIS — E876 Hypokalemia: Secondary | ICD-10-CM | POA: Diagnosis present

## 2015-05-02 DIAGNOSIS — Z803 Family history of malignant neoplasm of breast: Secondary | ICD-10-CM | POA: Diagnosis not present

## 2015-05-02 DIAGNOSIS — Z936 Other artificial openings of urinary tract status: Secondary | ICD-10-CM | POA: Diagnosis not present

## 2015-05-02 DIAGNOSIS — D649 Anemia, unspecified: Secondary | ICD-10-CM | POA: Diagnosis present

## 2015-05-02 DIAGNOSIS — N184 Chronic kidney disease, stage 4 (severe): Secondary | ICD-10-CM | POA: Diagnosis not present

## 2015-05-02 DIAGNOSIS — A419 Sepsis, unspecified organism: Secondary | ICD-10-CM | POA: Diagnosis not present

## 2015-05-02 DIAGNOSIS — Z960 Presence of urogenital implants: Secondary | ICD-10-CM

## 2015-05-02 DIAGNOSIS — K5730 Diverticulosis of large intestine without perforation or abscess without bleeding: Secondary | ICD-10-CM | POA: Diagnosis present

## 2015-05-02 DIAGNOSIS — I10 Essential (primary) hypertension: Secondary | ICD-10-CM | POA: Diagnosis present

## 2015-05-02 DIAGNOSIS — R509 Fever, unspecified: Secondary | ICD-10-CM | POA: Diagnosis present

## 2015-05-02 DIAGNOSIS — H409 Unspecified glaucoma: Secondary | ICD-10-CM | POA: Diagnosis present

## 2015-05-02 DIAGNOSIS — I130 Hypertensive heart and chronic kidney disease with heart failure and stage 1 through stage 4 chronic kidney disease, or unspecified chronic kidney disease: Secondary | ICD-10-CM | POA: Diagnosis present

## 2015-05-02 DIAGNOSIS — I4891 Unspecified atrial fibrillation: Secondary | ICD-10-CM | POA: Diagnosis present

## 2015-05-02 DIAGNOSIS — I5032 Chronic diastolic (congestive) heart failure: Secondary | ICD-10-CM | POA: Diagnosis present

## 2015-05-02 DIAGNOSIS — N200 Calculus of kidney: Secondary | ICD-10-CM

## 2015-05-02 DIAGNOSIS — N12 Tubulo-interstitial nephritis, not specified as acute or chronic: Secondary | ICD-10-CM

## 2015-05-02 LAB — I-STAT TROPONIN, ED: Troponin i, poc: 0 ng/mL (ref 0.00–0.08)

## 2015-05-02 LAB — URINE MICROSCOPIC-ADD ON

## 2015-05-02 LAB — URINALYSIS, ROUTINE W REFLEX MICROSCOPIC
Bilirubin Urine: NEGATIVE
Glucose, UA: NEGATIVE mg/dL
Ketones, ur: NEGATIVE mg/dL
Nitrite: NEGATIVE
Protein, ur: 100 mg/dL — AB
Specific Gravity, Urine: 1.01 (ref 1.005–1.030)
Urobilinogen, UA: 0.2 mg/dL (ref 0.0–1.0)
pH: 7.5 (ref 5.0–8.0)

## 2015-05-02 LAB — COMPREHENSIVE METABOLIC PANEL
ALT: 10 U/L — ABNORMAL LOW (ref 14–54)
AST: 15 U/L (ref 15–41)
Albumin: 3.4 g/dL — ABNORMAL LOW (ref 3.5–5.0)
Alkaline Phosphatase: 70 U/L (ref 38–126)
Anion gap: 14 (ref 5–15)
BUN: 62 mg/dL — ABNORMAL HIGH (ref 6–20)
CO2: 17 mmol/L — ABNORMAL LOW (ref 22–32)
Calcium: 9.9 mg/dL (ref 8.9–10.3)
Chloride: 104 mmol/L (ref 101–111)
Creatinine, Ser: 4.78 mg/dL — ABNORMAL HIGH (ref 0.44–1.00)
GFR calc Af Amer: 10 mL/min — ABNORMAL LOW (ref >60)
GFR calc non Af Amer: 8 mL/min — ABNORMAL LOW (ref >60)
Glucose, Bld: 108 mg/dL — ABNORMAL HIGH (ref 65–99)
Potassium: 4 mmol/L (ref 3.5–5.1)
Sodium: 135 mmol/L (ref 135–145)
Total Bilirubin: 0.4 mg/dL (ref 0.3–1.2)
Total Protein: 6.9 g/dL (ref 6.5–8.1)

## 2015-05-02 LAB — CBC WITH DIFFERENTIAL/PLATELET
Basophils Absolute: 0 10*3/uL (ref 0.0–0.1)
Basophils Relative: 0 %
Eosinophils Absolute: 0.1 10*3/uL (ref 0.0–0.7)
Eosinophils Relative: 0 %
HCT: 36 % (ref 36.0–46.0)
Hemoglobin: 11.2 g/dL — ABNORMAL LOW (ref 12.0–15.0)
Lymphocytes Relative: 7 %
Lymphs Abs: 0.9 10*3/uL (ref 0.7–4.0)
MCH: 28.1 pg (ref 26.0–34.0)
MCHC: 31.1 g/dL (ref 30.0–36.0)
MCV: 90.2 fL (ref 78.0–100.0)
Monocytes Absolute: 0.9 10*3/uL (ref 0.1–1.0)
Monocytes Relative: 6 %
Neutro Abs: 12 10*3/uL — ABNORMAL HIGH (ref 1.7–7.7)
Neutrophils Relative %: 87 %
Platelets: 203 10*3/uL (ref 150–400)
RBC: 3.99 MIL/uL (ref 3.87–5.11)
RDW: 17.6 % — ABNORMAL HIGH (ref 11.5–15.5)
WBC: 13.8 10*3/uL — ABNORMAL HIGH (ref 4.0–10.5)

## 2015-05-02 LAB — I-STAT CG4 LACTIC ACID, ED
Lactic Acid, Venous: 1.71 mmol/L (ref 0.5–2.0)
Lactic Acid, Venous: 0.36 mmol/L — ABNORMAL LOW (ref 0.5–2.0)

## 2015-05-02 MED ORDER — MORPHINE SULFATE (PF) 2 MG/ML IV SOLN: 1.0000 mg | INTRAVENOUS | Status: DC | PRN

## 2015-05-02 MED ORDER — SODIUM CHLORIDE 0.9 % IV BOLUS (SEPSIS)
2500.0000 mL | Freq: Once | INTRAVENOUS | Status: AC
Start: 2015-05-02 — End: 2015-05-02
  Administered 2015-05-02: 2500 mL via INTRAVENOUS

## 2015-05-02 MED ORDER — VANCOMYCIN HCL 10 G IV SOLR
1500.0000 mg | Freq: Once | INTRAVENOUS | Status: AC
  Administered 2015-05-02: 1500 mg via INTRAVENOUS
  Filled 2015-05-02: qty 1500

## 2015-05-02 MED ORDER — ACETAMINOPHEN 500 MG PO TABS
1000.0000 mg | ORAL_TABLET | Freq: Once | ORAL | Status: AC
  Administered 2015-05-02: 1000 mg via ORAL
  Filled 2015-05-02: qty 2

## 2015-05-02 MED ORDER — TIMOLOL MALEATE 0.5 % OP SOLN
1.0000 [drp] | Freq: Every morning | OPHTHALMIC | Status: DC
  Administered 2015-05-03 – 2015-05-07 (×4): 1 [drp] via OPHTHALMIC
  Filled 2015-05-02 (×2): qty 5

## 2015-05-02 MED ORDER — PIPERACILLIN-TAZOBACTAM 3.375 G IVPB 30 MIN
3.3750 g | Freq: Once | INTRAVENOUS | Status: AC
  Administered 2015-05-02: 3.375 g via INTRAVENOUS
  Filled 2015-05-02: qty 50

## 2015-05-02 MED ORDER — DIPHENHYDRAMINE HCL 50 MG/ML IJ SOLN
25.0000 mg | Freq: Once | INTRAMUSCULAR | Status: AC
  Administered 2015-05-02: 25 mg via INTRAVENOUS
  Filled 2015-05-02: qty 1

## 2015-05-02 MED ORDER — PIPERACILLIN-TAZOBACTAM IN DEX 2-0.25 GM/50ML IV SOLN
2.2500 g | Freq: Four times a day (QID) | INTRAVENOUS | Status: DC
Start: 2015-05-03 — End: 2015-05-05
  Administered 2015-05-03 – 2015-05-05 (×10): 2.25 g via INTRAVENOUS
  Filled 2015-05-02 (×12): qty 50

## 2015-05-02 MED ORDER — SODIUM CHLORIDE 0.9 % IV BOLUS (SEPSIS): 500.0000 mL | Freq: Once | INTRAVENOUS | Status: DC

## 2015-05-02 MED ORDER — METOCLOPRAMIDE HCL 5 MG/ML IJ SOLN
10.0000 mg | Freq: Once | INTRAMUSCULAR | Status: AC
  Administered 2015-05-02: 10 mg via INTRAVENOUS
  Filled 2015-05-02: qty 2

## 2015-05-02 MED ORDER — ENOXAPARIN SODIUM 30 MG/0.3ML ~~LOC~~ SOLN
30.0000 mg | Freq: Every day | SUBCUTANEOUS | Status: DC
  Administered 2015-05-03: 30 mg via SUBCUTANEOUS
  Filled 2015-05-02 (×2): qty 0.3

## 2015-05-02 MED ORDER — SODIUM CHLORIDE 0.9 % IV SOLN
INTRAVENOUS | Status: DC
Start: 2015-05-02 — End: 2015-05-07
  Administered 2015-05-02 – 2015-05-03 (×2): 1000 mL via INTRAVENOUS
  Administered 2015-05-06 – 2015-05-07 (×3): via INTRAVENOUS

## 2015-05-02 NOTE — ED Provider Notes (Signed)
CSN: [id number]     Arrival date & time 05/02/15  1721 History   First MD Initiated Contact with Patient 05/02/15 1756     Chief Complaint  Patient presents with  . Chills     (Consider location/radiation/quality/duration/timing/severity/associated sxs/prior Treatment) Patient is a 69 y.o. female presenting with general illness. The history is provided by the patient.  Illness Severity:  Moderate Onset quality:  Sudden Duration:  1 day Timing:  Constant Progression:  Unchanged Chronicity:  New Associated symptoms: no chest pain, no congestion, no fever, no headaches, no myalgias, no nausea, no rhinorrhea, no shortness of breath, no vomiting and no wheezing    69 yo F with a chief complaint of chills. Patient states that this started today. Patient denies cough congestion fevers dysuria. Patient denies abdominal pain. Patient states the last time this happened she was in acute renal failure and had to be admitted. Patient has been having issues with her kidneys as well as to have a transplant done in 3 days. Patient has a right nephrostomy tube in place. Is not having any consultations with this. Denies any nausea or vomiting. Denies diarrhea.   Past Medical History  Diagnosis Date  . GERD (gastroesophageal reflux disease)   . Glaucoma   . H/O renal calculi 2002 & 2006  . H/O hiatal hernia   . Headache(784.0)     migraine  . Pneumonia     dx 10-06-2014 per CXR--  on 10-27-2014 pt states finished antibiotic and denies cough or fever  . Hyperlipidemia   . History of MI (myocardial infarction)     10/ 2013 in setting of Septic Shock  . History of atrial fibrillation     10/ 2013  in setting of Septic Shock  . History of CHF (congestive heart failure)     10/ 2013 in setting of septic shock  . Sigmoid diverticulosis   . Nephrolithiasis     bilateral  . CKD (chronic kidney disease), stage III     nephrologist-  dr Florene Glen  . Right ureteral stone   . History of acute  respiratory failure     10/ 2013  -- ventilated in setting septic shock  . Dysrhythmia     Afib in 2013 when she had septic shock related to stone ureteral obstruction  . Myocardial infarction [facility])     Was reported in 2013 during hospitalization with septic shock  . Septic shock ([place]) 04/04/2012  . Sepsis ([place])   . Complication of anesthesia     use a little anesthesia , per patient MD states she quit breathing   . Hypertension     medication removed from regimen due to low blood pressure   . Peripheral vascular disease ([place])   . S/P hemodialysis catheter insertion ([employer]) 04/11/2015     right anterior chest , only used once   . History of nephrostomy 04/11/2015     currently inplace 04/28/2015   . History of blood transfusion 04/13/2015    Past Surgical History  Procedure Laterality Date  . Cystoscopy w/ ureteral stent placement  04/04/2012    Procedure: CYSTOSCOPY WITH RETROGRADE PYELOGRAM/URETERAL STENT PLACEMENT;  Surgeon: Ailene Rud, MD;  Location: [place];  Service: Urology;  Laterality: Left;  . Total abdominal hysterectomy w/ bilateral salpingoophorectomy  1993    secondary to fibroids  . Breast biopsy Left 08/23/07    benign fibrocystic with duct ectasia  . Cardiac catheterization  07-11-2012  dr Irish Lack    Abnormal  stress test/   normal coronary arteries/  LVEDP  29mmHg  . Cardiovascular stress test  06-26-2012  dr Irish Lack    marked ischemia in the basal anterior, mid anterior, apical inferior regions/  normal LVF, ef 63%  . Transthoracic echocardiogram  04-09-2012    normal LVF,  ef 60-65%,  mild LAE,  mild TR, trivial MR and PR  . Cataract extraction w/ intraocular lens  implant, bilateral    . Knee arthroscopy Left 02-14-2003  . Laparoscopic cholecystectomy  03-23-2005  . Extracorporeal shock wave lithotripsy  05-28-2012  &  10-08-2012  . Cystoscopy with stent placement Right 10/28/2014    Procedure: RIGHT URETERAL STENT PLACEMENT;  Surgeon: Irine Seal, MD;   Location: [facility];  Service: Urology;  Laterality: Right;  . Cystoscopy/retrograde/ureteroscopy/stone extraction with basket Right 11/21/2014    Procedure: CYSTOSCOPY/RIGHT RETROGRADE PYELOGRAM/RIGHT URETEROSCOPY/BASKET EXTRACTION/RIGHT PYELOSCOPY/LASER OF STONE/RIGHT DOUBLE J STENT;  Surgeon: Carolan Clines, MD;  Location: [place];  Service: Urology;  Laterality: Right;  . Holmium laser application Right [id number]    Procedure: HOLMIUM LASER APPLICATION;  Surgeon: Carolan Clines, MD;  Location: [facility];  Service: Urology;  Laterality: Right;  . Cystoscopy with stent placement Right 02/26/2015    Procedure: CYSTOSCOPY RETROGRADE PYELOGRAM WITH STENT PLACEMENT;  Surgeon: Cleon Gustin, MD;  Location: [facility];  Service: Urology;  Laterality: Right;   Family History  Problem Relation Age of Onset  . Hypertension Mother   . Cancer Mother 52    breast  . Dementia Mother   . Hypertension Brother   . Diabetes Brother   . Cancer Father 39    pancreatic  . Heart failure Paternal Grandmother   . Bladder Cancer Maternal Grandfather    Social History  Substance Use Topics  . Smoking status: Never Smoker   . Smokeless tobacco: Never Used  . Alcohol Use: No   OB History    Gravida Para Term Preterm AB TAB SAB Ectopic Multiple Living   1 1 1       1      Review of Systems  Constitutional: Positive for chills. Negative for fever.  HENT: Negative for congestion and rhinorrhea.   Eyes: Negative for redness and visual disturbance.  Respiratory: Negative for shortness of breath and wheezing.   Cardiovascular: Negative for chest pain and palpitations.  Gastrointestinal: Negative for nausea and vomiting.  Genitourinary: Negative for dysuria and urgency.  Musculoskeletal: Negative for myalgias and arthralgias.  Skin: Negative for pallor and wound.  Neurological: Negative for dizziness and headaches.      Allergies  Vicodin and  Percocet  Home Medications   Prior to Admission medications   Medication Sig Start Date End Date Taking? Authorizing Provider  acetaminophen (TYLENOL) 500 MG tablet Take 500 mg by mouth every 6 (six) hours as needed for moderate pain.   Yes Historical Provider, MD  bimatoprost (LUMIGAN) 0.01 % SOLN Place 1 drop into both eyes at bedtime.   Yes Historical Provider, MD  ENSURE PLUS (ENSURE PLUS) LIQD Take 237 mLs by mouth daily as needed (nutritional supplement).    Yes Historical Provider, MD  HYDROcodone-acetaminophen (NORCO/VICODIN) 5-325 MG tablet Take 1 tablet by mouth 4 (four) times daily as needed for moderate pain.  04/27/15  Yes Historical Provider, MD  metoprolol tartrate (LOPRESSOR) 25 MG tablet Take 0.5 tablets (12.5 mg total) by mouth 2 (two) times daily. Patient taking differently: Take 12.5 mg by mouth daily. Patient takes in the am  04/16/15  Yes Orson Eva, MD  ranitidine (ZANTAC) 150 MG tablet Take 150 mg by mouth 2 (two) times daily as needed for heartburn.   Yes Historical Provider, MD  sodium chloride (OCEAN) 0.65 % SOLN nasal spray Place 1 spray into both nostrils 3 (three) times daily as needed for congestion.   Yes Historical Provider, MD  timolol (BETIMOL) 0.5 % ophthalmic solution Place 1 drop into both eyes every morning.    Yes Historical Provider, MD   BP 105/55 mmHg  Pulse 86  Temp(Src) 98.3 F (36.8 C) (Oral)  Resp 16  SpO2 97%  LMP 01/19/1992 Physical Exam  Constitutional: She is oriented to person, place, and time. She appears well-developed and well-nourished. No distress.  HENT:  Head: Normocephalic and atraumatic.  Eyes: EOM are normal. Pupils are equal, round, and reactive to light.  Neck: Normal range of motion. Neck supple.  Cardiovascular: Normal rate and regular rhythm.  Exam reveals no gallop and no friction rub.   No murmur heard. Pulmonary/Chest: Effort normal. She has no wheezes. She has no rales.  Abdominal: Soft. She exhibits no distension.  There is no tenderness. There is no rebound and no guarding.  Musculoskeletal: She exhibits no edema or tenderness.  Neurological: She is alert and oriented to person, place, and time.  Skin: Skin is warm and dry. She is not diaphoretic.  Psychiatric: She has a normal mood and affect. Her behavior is normal.  Nursing note and vitals reviewed.   ED Course  Procedures (including critical care time) Labs Review Labs Reviewed  COMPREHENSIVE METABOLIC PANEL - Abnormal; Notable for the following:    CO2 17 (*)    Glucose, Bld 108 (*)    BUN 62 (*)    Creatinine, Ser 4.78 (*)    Albumin 3.4 (*)    ALT 10 (*)    GFR calc non Af Amer 8 (*)    GFR calc Af Amer 10 (*)    All other components within normal limits  URINALYSIS, ROUTINE W REFLEX MICROSCOPIC (NOT AT [facility]) - Abnormal; Notable for the following:    APPearance TURBID (*)    Hgb urine dipstick MODERATE (*)    Protein, ur 100 (*)    Leukocytes, UA LARGE (*)    All other components within normal limits  CBC WITH DIFFERENTIAL/PLATELET - Abnormal; Notable for the following:    WBC 13.8 (*)    Hemoglobin 11.2 (*)    RDW 17.6 (*)    Neutro Abs 12.0 (*)    All other components within normal limits  I-STAT CG4 LACTIC ACID, ED - Abnormal; Notable for the following:    Lactic Acid, Venous 0.36 (*)    All other components within normal limits  CULTURE, BLOOD (ROUTINE X 2)  CULTURE, BLOOD (ROUTINE X 2)  URINE CULTURE  URINE MICROSCOPIC-ADD ON  BASIC METABOLIC PANEL  CBC WITH DIFFERENTIAL/PLATELET  I-STAT CG4 LACTIC ACID, ED  I-STAT TROPOININ, ED    Imaging Review Ct Abdomen Pelvis Wo Contrast  05/02/2015  CLINICAL DATA:  Shaking chills and slurring words today. Right nephrostomy tube in place. EXAM: CT ABDOMEN AND PELVIS WITHOUT CONTRAST TECHNIQUE: Multidetector CT imaging of the abdomen and pelvis was performed following the standard protocol without IV contrast. COMPARISON:  CT abdomen and pelvis 02/20/2015 and 04/06/2015.  FINDINGS: Hiatal hernia is identified. No pleural or pericardial effusion. There is some atelectasis or scar in the left lung base. Right nephrostomy tube and right ureteral stent are in place. The  tubes are well-positioned. There is no right hydronephrosis. A tiny amount of air is seen in the right intrarenal collecting system likely related to the nephrostomy tube. Atrophy of the left kidney is again seen and there is mild fullness of the left intrarenal collecting system. There is some stranding about both kidneys. The walls left ureter appear somewhat thickened. Urinary bladder is decompressed scratch the urinary bladder is nearly completely decompressed. The gallbladder is been removed. The liver, spleen, adrenal glands and pancreas are unremarkable. The patient is status post hysterectomy. Fat containing ventral hernias are again seen and unchanged. The largest hernias in the pelvis. Diverticulosis appears worst in the sigmoid but there is no evidence of diverticulitis. The small bowel and appendix are unremarkable. No fluid collection or lymphadenopathy is seen. Lumbar spondylosis appears worst at L2-3. No lytic or sclerotic bony lesion is identified. IMPRESSION: A nephrostomy tube in the right kidney is new since the comparison examinations. The right intrarenal collecting system is decompressed. Tiny amount of gas in an upper pole calyx is likely related to the nephrostomy tube. Right double-J ureteral stent remains in place in good position. Mild stranding about both kidneys. Very mild left hydronephrosis and of the walls of the left ureter is unchanged and may be due to infection or inflammation. Findings raise the possibility of pyelonephritis and/or ureteritis. Diverticulosis without diverticulitis. Hiatal hernia and fat containing ventral hernias, largest in the pelvis, are unchanged. Electronically Signed   By: Inge Rise M.D.   On: 05/02/2015 20:12   Dg Chest 2 View  05/02/2015  CLINICAL  DATA:  Acute onset of fever and shortness of breath. Initial encounter. EXAM: CHEST  2 VIEW COMPARISON:  Chest radiograph performed 02/25/2015 FINDINGS: The lungs are well-aerated and clear. There is no evidence of focal opacification, pleural effusion or pneumothorax. The heart is normal in size; the mediastinal contour is within normal limits. No acute osseous abnormalities are seen. A large hiatal hernia is noted, partially filled with air. A right-sided dual-lumen catheter is noted ending about the mid SVC. IMPRESSION: 1. No acute cardiopulmonary process are seen. 2. Large hiatal hernia noted. Electronically Signed   By: Garald Balding M.D.   On: 05/02/2015 18:20   I have personally reviewed and evaluated these images and lab results as part of my medical decision-making.   EKG Interpretation None      MDM   Final diagnoses:  Pyelonephritis    69 yo F with a chief complaint of chills. Heart rate in the 120s on arrival. Afebrile. We'll treat this as a possible sepsis. No noted reason to initiate code sepsis at this time. Chest x-ray unremarkable.  CT scan of the abdomen pelvis concerning for bilateral pyelonephritis. UA consistent with the same. Patient was given vancomycin and Zosyn with code sepsis. 2500cc of fluid.  Renal function at baseline.   Admit.  The patients results and plan were reviewed and discussed.   Any x-rays performed were independently reviewed by myself.   Differential diagnosis were considered with the presenting HPI.  Medications  sodium chloride 0.9 % bolus 500 mL (not administered)  enoxaparin (LOVENOX) injection 30 mg (not administered)  0.9 %  sodium chloride infusion (not administered)  morphine 2 MG/ML injection 1 mg (not administered)  timolol (TIMOPTIC) 0.5 % ophthalmic solution 1 drop (not administered)  piperacillin-tazobactam (ZOSYN) IVPB 2.25 g (not administered)  sodium chloride 0.9 % bolus 2,500 mL (2,500 mLs Intravenous New Bag/Given 05/02/15  1830)  vancomycin (VANCOCIN) 1,500 mg in  sodium chloride 0.9 % 500 mL IVPB (1,500 mg Intravenous New Bag/Given 05/02/15 1904)  piperacillin-tazobactam (ZOSYN) IVPB 3.375 g (0 g Intravenous Stopped 05/02/15 1903)  acetaminophen (TYLENOL) tablet 1,000 mg (1,000 mg Oral Given 05/02/15 1904)  metoCLOPramide (REGLAN) injection 10 mg (10 mg Intravenous Given 05/02/15 2041)  diphenhydrAMINE (BENADRYL) injection 25 mg (25 mg Intravenous Given 05/02/15 2042)    Filed Vitals:   05/02/15 1931 05/02/15 2044 05/02/15 2120 05/02/15 2246  BP:  112/51 112/57 105/55  Pulse:  102 98 86  Temp:  98.7 F (37.1 C) 98.7 F (37.1 C) 98.3 F (36.8 C)  TempSrc: Temporal Temporal Temporal Oral  Resp:  20 18 16   SpO2:  96% 97% 97%    Final diagnoses:  Pyelonephritis    Admission/ observation were discussed with the admitting physician, patient and/or family and they are comfortable with the plan.    Deno Etienne, DO 05/02/15 2333

## 2015-05-02 NOTE — H&P (Signed)
History and Physical  [name] [id number] DOB: 01/16/1946 DOA: 05/02/2015  PCP: [name], MD   Chief Complaint: chills  History of Present Illness:  - Patient is a 69 yo female with history of CKD IV s/p placement of cath for HD ( had only one session), nephrolithiasis s/p R.nephrostomy in Sep ( due to replace next week), recent admission for pyelonephritis who was brought with cc of chills that started today and was found to have a fever in the ER.  - She has been having shortness of breath but no cough, chest pain or URI symptoms. She denies N/V/abdominal pain. She denies changes in urine when emptying nephrostomy bag. No hematuria. No other complaints.   Review of Systems:  CONSTITUTIONAL:  No night sweats.  No fatigue, malaise, lethargy.  +fever +chills. Eyes:  No visual changes.  No eye pain.  No eye discharge.   ENT:    No epistaxis.  No sinus pain.  No sore throat.  No ear pain.  No congestion. RESPIRATORY:  No cough.  No wheeze.  No hemoptysis.  +shortness of breath. CARDIOVASCULAR:  No chest pains.  No palpitations. GASTROINTESTINAL:  No abdominal pain.  No nausea or vomiting.  No diarrhea or constipation.  No hematemesis.  No hematochezia.  No melena. GENITOURINARY:  No urgency.  No frequency.  No dysuria.  No hematuria.  +obstructive symptoms.  No discharge.  No pain.  No significant abnormal bleeding. MUSCULOSKELETAL:  No musculoskeletal pain.  No joint swelling.  No arthritis. NEUROLOGICAL:  No confusion.  No weakness. +headache. No seizure. PSYCHIATRIC:  No depression. No anxiety. No suicidal ideation. SKIN:  No rashes.  No lesions.  No wounds. ENDOCRINE:  No unexplained weight loss.  No polydipsia.  No polyuria.  No polyphagia. HEMATOLOGIC:  No anemia.  No purpura.  No petechiae.  No bleeding.  ALLERGIC AND IMMUNOLOGIC:  No pruritus.  No swelling Other:  Past Medical and Surgical History:   Past Medical History  Diagnosis Date  . GERD  (gastroesophageal reflux disease)   . Glaucoma   . H/O renal calculi 2002 & 2006  . H/O hiatal hernia   . Headache(784.0)     migraine  . Pneumonia     dx 10-06-2014 per CXR--  on 10-27-2014 pt states finished antibiotic and denies cough or fever  . Hyperlipidemia   . History of MI (myocardial infarction)     10/ 2013 in setting of Septic Shock  . History of atrial fibrillation     10/ 2013  in setting of Septic Shock  . History of CHF (congestive heart failure)     10/ 2013 in setting of septic shock  . Sigmoid diverticulosis   . Nephrolithiasis     bilateral  . CKD (chronic kidney disease), stage III     nephrologist-  dr Florene Glen  . Right ureteral stone   . History of acute respiratory failure     10/ 2013  -- ventilated in setting septic shock  . Dysrhythmia     Afib in 2013 when she had septic shock related to stone ureteral obstruction  . Myocardial infarction [facility])     Was reported in 2013 during hospitalization with septic shock  . Septic shock ([place]) 04/04/2012  . Sepsis ([place])   . Complication of anesthesia     use a little anesthesia , per patient MD states she quit breathing   . Hypertension     medication removed from regimen due to low blood  pressure   . Peripheral vascular disease ([place])   . S/P hemodialysis catheter insertion ([place]) 04/11/2015     right anterior chest , only used once   . History of nephrostomy 04/11/2015     currently inplace 04/28/2015   . History of blood transfusion 04/13/2015    Past Surgical History  Procedure Laterality Date  . Cystoscopy w/ ureteral stent placement  04/04/2012    Procedure: CYSTOSCOPY WITH RETROGRADE PYELOGRAM/URETERAL STENT PLACEMENT;  Surgeon: Ailene Rud, MD;  Location: [place];  Service: Urology;  Laterality: Left;  . Total abdominal hysterectomy w/ bilateral salpingoophorectomy  1993    secondary to fibroids  . Breast biopsy Left 08/23/07    benign fibrocystic with duct ectasia  . Cardiac catheterization   07-11-2012  dr Irish Lack    Abnormal stress test/   normal coronary arteries/  LVEDP  10mmHg  . Cardiovascular stress test  06-26-2012  dr Irish Lack    marked ischemia in the basal anterior, mid anterior, apical inferior regions/  normal LVF, ef 63%  . Transthoracic echocardiogram  04-09-2012    normal LVF,  ef 60-65%,  mild LAE,  mild TR, trivial MR and PR  . Cataract extraction w/ intraocular lens  implant, bilateral    . Knee arthroscopy Left 02-14-2003  . Laparoscopic cholecystectomy  03-23-2005  . Extracorporeal shock wave lithotripsy  05-28-2012  &  10-08-2012  . Cystoscopy with stent placement Right 10/28/2014    Procedure: RIGHT URETERAL STENT PLACEMENT;  Surgeon: Irine Seal, MD;  Location[facility];  Service: Urology;  Laterality: Right;  . Cystoscopy/retrograde/ureteroscopy/stone extraction with basket Right 11/21/2014    Procedure: CYSTOSCOPY/RIGHT RETROGRADE PYELOGRAM/RIGHT URETEROSCOPY/BASKET EXTRACTION/RIGHT PYELOSCOPY/LASER OF STONE/RIGHT DOUBLE J STENT;  Surgeon: Carolan Clines, MD;  Location: [place];  Service: Urology;  Laterality: Right;  . Holmium laser application Right [id number]    Procedure: HOLMIUM LASER APPLICATION;  Surgeon: Carolan Clines, MD;  Location: [facility];  Service: Urology;  Laterality: Right;  . Cystoscopy with stent placement Right 02/26/2015    Procedure: CYSTOSCOPY RETROGRADE PYELOGRAM WITH STENT PLACEMENT;  Surgeon: Cleon Gustin, MD;  Location: [facility];  Service: Urology;  Laterality: Right;    Social History:   reports that she has never smoked. She has never used smokeless tobacco. She reports that she does not drink alcohol or use illicit drugs.   Allergies  Allergen Reactions  . Vicodin [Hydrocodone-Acetaminophen] Nausea And Vomiting  . Percocet [Oxycodone-Acetaminophen] Nausea And Vomiting    Family History  Problem Relation Age of Onset  . Hypertension Mother   . Cancer Mother  53    breast  . Dementia Mother   . Hypertension Brother   . Diabetes Brother   . Cancer Father 18    pancreatic  . Heart failure Paternal Grandmother   . Bladder Cancer Maternal Grandfather       Prior to Admission medications   Medication Sig Start Date End Date Taking? Authorizing Provider  acetaminophen (TYLENOL) 500 MG tablet Take 500 mg by mouth every 6 (six) hours as needed for moderate pain.   Yes Historical Provider, MD  bimatoprost (LUMIGAN) 0.01 % SOLN Place 1 drop into both eyes at bedtime.   Yes Historical Provider, MD  ENSURE PLUS (ENSURE PLUS) LIQD Take 237 mLs by mouth daily as needed (nutritional supplement).    Yes Historical Provider, MD  HYDROcodone-acetaminophen (NORCO/VICODIN) 5-325 MG tablet Take 1 tablet by mouth 4 (four) times daily as needed for moderate  pain.  04/27/15  Yes Historical Provider, MD  metoprolol tartrate (LOPRESSOR) 25 MG tablet Take 0.5 tablets (12.5 mg total) by mouth 2 (two) times daily. Patient taking differently: Take 12.5 mg by mouth daily. Patient takes in the am 04/16/15  Yes Orson Eva, MD  ranitidine (ZANTAC) 150 MG tablet Take 150 mg by mouth 2 (two) times daily as needed for heartburn.   Yes Historical Provider, MD  sodium chloride (OCEAN) 0.65 % SOLN nasal spray Place 1 spray into both nostrils 3 (three) times daily as needed for congestion.   Yes Historical Provider, MD  timolol (BETIMOL) 0.5 % ophthalmic solution Place 1 drop into both eyes every morning.    Yes Historical Provider, MD    Physical Exam: BP 112/57 mmHg  Pulse 98  Temp(Src) 98.7 F (37.1 C) (Temporal)  Resp 18  SpO2 97%  LMP 01/19/1992  GENERAL : Well developed, well nourished, alert and cooperative, and appears to be in mild acute distress. HEAD: normocephalic. EYES: PERRL, EOMI. EARS: hearing grossly intact. NOSE: No nasal discharge. THROAT: Oral cavity and pharynx normal.  NECK: Neck supple CARDIAC: Normal S1 and S2. No S3, S4 or murmurs. Rhythm is  regular. There is no peripheral edema, LUNGS: Clear to auscultation and percussion without rales, rhonchi, wheezing or diminished breath sounds. ABDOMEN: Positive bowel sounds. Soft, nondistended, nontender.  EXTREMITIES: No significant deformity or joint abnormality. No edema.  NEUROLOGICAL: The mental examination revealed the patient was oriented to person, place, and time.CN II-XII intact. Strength and sensation symmetric and intact throughout.  SKIN: Skin normal color PSYCHIATRIC:  The patient was able to demonstrate good judgement and reason, without hallucinations, abnormal affect or abnormal behaviors during the examination. Patient is not suicidal.          Labs on Admission:  Reviewed.   Radiological Exams on Admission: Ct Abdomen Pelvis Wo Contrast  05/02/2015  CLINICAL DATA:  Shaking chills and slurring words today. Right nephrostomy tube in place. EXAM: CT ABDOMEN AND PELVIS WITHOUT CONTRAST TECHNIQUE: Multidetector CT imaging of the abdomen and pelvis was performed following the standard protocol without IV contrast. COMPARISON:  CT abdomen and pelvis 02/20/2015 and 04/06/2015. FINDINGS: Hiatal hernia is identified. No pleural or pericardial effusion. There is some atelectasis or scar in the left lung base. Right nephrostomy tube and right ureteral stent are in place. The tubes are well-positioned. There is no right hydronephrosis. A tiny amount of air is seen in the right intrarenal collecting system likely related to the nephrostomy tube. Atrophy of the left kidney is again seen and there is mild fullness of the left intrarenal collecting system. There is some stranding about both kidneys. The walls left ureter appear somewhat thickened. Urinary bladder is decompressed scratch the urinary bladder is nearly completely decompressed. The gallbladder is been removed. The liver, spleen, adrenal glands and pancreas are unremarkable. The patient is status post hysterectomy. Fat containing  ventral hernias are again seen and unchanged. The largest hernias in the pelvis. Diverticulosis appears worst in the sigmoid but there is no evidence of diverticulitis. The small bowel and appendix are unremarkable. No fluid collection or lymphadenopathy is seen. Lumbar spondylosis appears worst at L2-3. No lytic or sclerotic bony lesion is identified. IMPRESSION: A nephrostomy tube in the right kidney is new since the comparison examinations. The right intrarenal collecting system is decompressed. Tiny amount of gas in an upper pole calyx is likely related to the nephrostomy tube. Right double-J ureteral stent remains in place in good position.  Mild stranding about both kidneys. Very mild left hydronephrosis and of the walls of the left ureter is unchanged and may be due to infection or inflammation. Findings raise the possibility of pyelonephritis and/or ureteritis. Diverticulosis without diverticulitis. Hiatal hernia and fat containing ventral hernias, largest in the pelvis, are unchanged. Electronically Signed   By: Inge Rise M.D.   On: 05/02/2015 20:12   Dg Chest 2 View  05/02/2015  CLINICAL DATA:  Acute onset of fever and shortness of breath. Initial encounter. EXAM: CHEST  2 VIEW COMPARISON:  Chest radiograph performed 02/25/2015 FINDINGS: The lungs are well-aerated and clear. There is no evidence of focal opacification, pleural effusion or pneumothorax. The heart is normal in size; the mediastinal contour is within normal limits. No acute osseous abnormalities are seen. A large hiatal hernia is noted, partially filled with air. A right-sided dual-lumen catheter is noted ending about the mid SVC. IMPRESSION: 1. No acute cardiopulmonary process are seen. 2. Large hiatal hernia noted. Electronically Signed   By: Garald Balding M.D.   On: 05/02/2015 18:20    EKG:  Independently reviewed. Sinus tachycardia   Assessment/Plan  Sepsis due to pyelonephritis:  Ucx and Bcx sent.  Started on  vanc/zosyn in the ER. Will continue Zosyn BP responded well to 2.5 L of NS, continue IVF at 100 cc/hr Consult to Nephro and Urology in am.  Morphine prn pain.   CKD IV: Cr at baseline, still making urine. Consult to nephro in am.   HTN: hold BP meds.    Input & Output: monitored.  Lines & Tubes: permacath: clean. R.nephrostomy tube: clean DVT prophylaxis:   enoxaparin Consultants: Nephro/Urology Code Status: Full code       Gennaro Africa M.D [facility]

## 2015-05-02 NOTE — ED Notes (Signed)
Her daughter states pt. Has been slurring her words today and having frequent shaking chills.  She states pt. Has done this in the past and has been admitted for sepsis--most recent d/c from hospital was 2 weeks ago.  Pt. Has non-functioning left kidney, and has a right nephrostomy tube.  Pt. Is drowsy in her demeanor and in no distress.

## 2015-05-02 NOTE — ED Notes (Signed)
Pt in DG at present time. 

## 2015-05-03 ENCOUNTER — Encounter (HOSPITAL_COMMUNITY): Payer: Self-pay | Admitting: *Deleted

## 2015-05-03 LAB — BASIC METABOLIC PANEL
Anion gap: 8 (ref 5–15)
BUN: 63 mg/dL — ABNORMAL HIGH (ref 6–20)
CO2: 21 mmol/L — ABNORMAL LOW (ref 22–32)
Calcium: 9.3 mg/dL (ref 8.9–10.3)
Chloride: 108 mmol/L (ref 101–111)
Creatinine, Ser: 4.48 mg/dL — ABNORMAL HIGH (ref 0.44–1.00)
GFR calc Af Amer: 11 mL/min — ABNORMAL LOW (ref >60)
GFR calc non Af Amer: 9 mL/min — ABNORMAL LOW (ref >60)
Glucose, Bld: 119 mg/dL — ABNORMAL HIGH (ref 65–99)
Potassium: 3.8 mmol/L (ref 3.5–5.1)
Sodium: 137 mmol/L (ref 135–145)

## 2015-05-03 LAB — CBC WITH DIFFERENTIAL/PLATELET
Basophils Absolute: 0 10*3/uL (ref 0.0–0.1)
Basophils Relative: 0 %
Eosinophils Absolute: 0.1 10*3/uL (ref 0.0–0.7)
Eosinophils Relative: 1 %
HCT: 32.4 % — ABNORMAL LOW (ref 36.0–46.0)
Hemoglobin: 10.4 g/dL — ABNORMAL LOW (ref 12.0–15.0)
Lymphocytes Relative: 6 %
Lymphs Abs: 0.7 10*3/uL (ref 0.7–4.0)
MCH: 29.1 pg (ref 26.0–34.0)
MCHC: 32.1 g/dL (ref 30.0–36.0)
MCV: 90.5 fL (ref 78.0–100.0)
Monocytes Absolute: 0.7 10*3/uL (ref 0.1–1.0)
Monocytes Relative: 6 %
Neutro Abs: 10.1 10*3/uL — ABNORMAL HIGH (ref 1.7–7.7)
Neutrophils Relative %: 87 %
Platelets: 206 10*3/uL (ref 150–400)
RBC: 3.58 MIL/uL — ABNORMAL LOW (ref 3.87–5.11)
RDW: 17.7 % — ABNORMAL HIGH (ref 11.5–15.5)
WBC: 11.6 10*3/uL — ABNORMAL HIGH (ref 4.0–10.5)

## 2015-05-03 MED ORDER — MORPHINE SULFATE (PF) 2 MG/ML IV SOLN: 2.0000 mg | INTRAVENOUS | Status: DC | PRN

## 2015-05-03 MED ORDER — LATANOPROST 0.005 % OP SOLN
1.0000 [drp] | Freq: Every day | OPHTHALMIC | Status: DC
  Administered 2015-05-03 – 2015-05-06 (×4): 1 [drp] via OPHTHALMIC
  Filled 2015-05-03 (×2): qty 2.5

## 2015-05-03 MED ORDER — HEPARIN SODIUM (PORCINE) 5000 UNIT/ML IJ SOLN
5000.0000 [IU] | Freq: Three times a day (TID) | INTRAMUSCULAR | Status: DC
  Administered 2015-05-03 – 2015-05-07 (×11): 5000 [IU] via SUBCUTANEOUS
  Filled 2015-05-03 (×16): qty 1

## 2015-05-03 MED ORDER — ACETAMINOPHEN 325 MG PO TABS
650.0000 mg | ORAL_TABLET | Freq: Four times a day (QID) | ORAL | Status: DC | PRN
  Administered 2015-05-05: 650 mg via ORAL
  Filled 2015-05-03 (×2): qty 2

## 2015-05-03 MED ORDER — ACETAMINOPHEN 325 MG PO TABS
650.0000 mg | ORAL_TABLET | Freq: Four times a day (QID) | ORAL | Status: DC | PRN
  Administered 2015-05-03: 650 mg via ORAL
  Filled 2015-05-03: qty 2

## 2015-05-03 MED ORDER — SACCHAROMYCES BOULARDII 250 MG PO CAPS
250.0000 mg | ORAL_CAPSULE | Freq: Two times a day (BID) | ORAL | Status: DC
  Administered 2015-05-03 – 2015-05-07 (×8): 250 mg via ORAL
  Filled 2015-05-03 (×10): qty 1

## 2015-05-03 MED ORDER — HYDROCODONE-ACETAMINOPHEN 5-325 MG PO TABS: 1.0000 | ORAL_TABLET | Freq: Four times a day (QID) | ORAL | Status: DC | PRN

## 2015-05-03 MED ORDER — FAMOTIDINE 20 MG PO TABS
20.0000 mg | ORAL_TABLET | Freq: Every day | ORAL | Status: DC
  Administered 2015-05-03 – 2015-05-07 (×4): 20 mg via ORAL
  Filled 2015-05-03 (×6): qty 1

## 2015-05-03 MED ORDER — SALINE SPRAY 0.65 % NA SOLN
1.0000 | Freq: Three times a day (TID) | NASAL | Status: DC | PRN
  Filled 2015-05-03: qty 44

## 2015-05-03 NOTE — H&P (Signed)
Reason For Visit Discuss surgery for stent exchange   Active Problems Problems  1. Bilateral kidney stones (N20.0)   Assessed By: Alexis Frock (Urology); Last Assessed: 26 Feb 2015 2. Chronic kidney disease, stage IV (severe) (N18.4)  History of Present Illness     69 yo female returns today to discuss surgery for stent exchange that is scheduled for 05/04/15. Recent hospitalization from 02/25/15 - 03/01/15. She had urosepsis due to a UTI and was started on Rocephin. Urine culture showed insignificant growth. She was d/c'd on Keflex 500mg  x 5 days. She is s/p cysto/Rt RPG/Rt JJ stent by Dr. Noah Delaine on 02/26/15 for Rt hydronephrosis. Renal u/s on 02/27/15 moderate Rt hydronephrosis and Lt mild hydronephrosis.       1 - Recurrent Nephrolithiasis - s/p right ureteroscopic stone manipulation 12/2014 right ureteral stone after prior stenting during episode acute renal failure. JJ stent in situ. Post-op CT confirms stone free, complete resolution of hydro.    2 - Left Renal Atrophy / Stage 4 Chronic Renal Insufficiency - Baseline Cr 1.8 pre 2016, has atrophic left kidney. Acute rise to 4.8 during episode right obstruction 10/2014. Since then Cr 3-4 range w/o hyperkalemia. She had been on some NSAIDS previously. Has seen Dr. Florene Glen previously.     PMH sig for AFib, CRI. Her PCP is Lajean Manes MD with [facility].     She saw Dr. Florene Glen (Nephrologist) on 02/19/15.   Past Medical History Problems  1. History of Acute renal insufficiency (N28.9) 2. History of Anxiety (F41.9) 3. History of Atrial fibrillation (I48.91) 4. History of Atrial fibrillation (I48.91) 5. History of Calculus of ureter (N20.1) 6. History of esophageal reflux (Z87.19) 7. History of esophageal reflux (Z87.19) 8. History of glaucoma (Z86.69) 9. History of hypertension (Z86.79) 10. History of hypertension (Z86.79) 11. History of kidney stones (Z87.442) 12. History of migraine headaches (Z86.69) 13. History of  Pneumonia 14. History of Right ureteral stone (N20.1) 15. History of Sepsis (A41.9) 16. History of ST Elevation Myocardial Infarction  Surgical History Problems  1. History of Cholecystectomy 2. History of Cholecystectomy 3. History of Cystoscopy With Insertion Of Ureteral Stent Left 4. History of Cystoscopy With Insertion Of Ureteral Stent Right 5. History of Cystoscopy With Insertion Of Ureteral Stent Right 6. History of Cystoscopy With Insertion Of Ureteral Stent Right 7. History of Cystoscopy With Ureteroscopy With Lithotripsy 8. History of Hysterectomy 9. History of Hysterectomy 10. History of Knee Surgery 11. History of Renal Lithotripsy 12. History of Renal Lithotripsy 13. History of Renal Lithotripsy  Current Meds 1. Diazepam 10 MG Oral Tablet; Insert One tablet vaginally at night/prior to bedtime prn for  pelvic pain;  Therapy: [phone] to (Evaluate:24Aug2016); Last Rx:09Aug2016 Ordered 2. Lumigan 0.01 % Ophthalmic Solution;  Therapy: (Recorded:21Nov2013) to Recorded 3. Metoprolol Tartrate 25 MG Oral Tablet;  Therapy: (Recorded:02Apr2014) to Recorded 4. Ondansetron HCl - 4 MG Oral Tablet; Take 1/2 tablet 4x/day  as needed for nausea   Requested for: 17Oct2016; Last Rx:17Oct2016 Ordered 5. Pyridium 200 MG Oral Tablet; TAKE 1 TABLET 3 TIMES DAILY AS NEEDED FOR PAIN;  Therapy: 14Sep2016 to (Evaluate:13Dec2016)  Requested for: 14Sep2016; Last  Rx:14Sep2016 Ordered 6. Timolol Maleate 0.5 % Ophthalmic Solution;  Therapy: (Recorded:20Oct2015) to Recorded  Allergies Medication  1. Percocet TABS 2. Vicodin TABS  Family History Problems  1. Family history of Death In The Family Father   Died age 110- pancreatic cancer 2. Family history of Death In The Family Mother   Died age 41-Dementia 79. Family history  of Nephrolithiasis : Father 4. Family history of Nephrolithiasis : Brother  Social History Problems  1. Denied: History of Alcohol Use (History) 2. Denied:  History of Caffeine Use 3. Marital History - Widowed 4. Never A Smoker 5. Occupation:   Herbalist 6. Denied: History of Tobacco Use  Review of Systems Genitourinary, constitutional, skin, eye, otolaryngeal, hematologic/lymphatic, cardiovascular, pulmonary, endocrine, musculoskeletal, gastrointestinal, neurological and psychiatric system(s) were reviewed and pertinent findings if present are noted and are otherwise negative.  Genitourinary: nocturia and hematuria, but no urinary frequency, no urinary urgency, no dysuria, no urinary hesitancy, no incontinence, urinary stream does not start and stop, no incomplete emptying of bladder, no pelvic pain and initiating urination does not require straining.    Vitals Vital Signs [Data Includes: Last 1 Day]  Recorded: [id number] 04:26PM  Blood Pressure: 136 / 77 Temperature: 98.1 F Heart Rate: 71  Physical Exam Constitutional: Well nourished and well developed . No acute distress.  ENT:. The ears and nose are normal in appearance.  Neck: The appearance of the neck is normal and no neck mass is present.  Pulmonary: No respiratory distress and normal respiratory rhythm and effort.  Cardiovascular: Heart rate and rhythm are normal . No peripheral edema.  Abdomen: Right flank dressing changed. Incision site(s) healing well. The abdomen is obese, but not distended. The abdomen is soft and nontender. No masses are palpated. The abdomen is normal to percussion. Mild. Bowel sounds are normal. No hernias are palpable. No hepatosplenomegaly noted.  Lymphatics: The femoral and inguinal nodes are not enlarged or tender.  Skin: Normal skin turgor, no visible rash and no visible skin lesions.  Neuro/Psych:. Mood and affect are appropriate.    Results/Data Urine [Data Includes: Last 1 Day]   [id number]  COLOR YELLOW   APPEARANCE CLOUDY   SPECIFIC GRAVITY 1.015   pH 6.5   GLUCOSE NEGATIVE   BILIRUBIN NEGATIVE   KETONE NEGATIVE   BLOOD 2+   PROTEIN  2+   NITRITE NEGATIVE   LEUKOCYTE ESTERASE 3+   SQUAMOUS EPITHELIAL/HPF 0-5 HPF  WBC >60 WBC/HPF  RBC 3-10 RBC/HPF  BACTERIA MANY HPF  CRYSTALS NONE SEEN HPF  CASTS NONE SEEN LPF  Yeast NONE SEEN HPF   Assessment Assessed  1. Chronic kidney disease, stage IV (severe) (N18.4) 2. Hydronephrosis (N13.30)  Much improved since she had percutaneous cath placed, per persistance of Dr. Florene Glen, despite both Urology and IR not thinking it would work.   Her new Cr normal is 4.5. She will continue with plan for JJ stent change next Monday, understanding the risk of anesthesia ( has coded in the past with anesthesia)- and we will discuss change of her perc. tube with IR. She has not needed as much medication since she has been out of the hospital, decreasing her metoprolol. Her appetite has returned and her energy has returned. She feels the best she has felt "in quite a while'-attributable by her to dialysis ( able to eat), perc tube ( felt better immediately); and transfusion ( " start feeling better").   Plan Chronic kidney disease, stage IV (severe)  1. Start: Hydrocodone-Acetaminophen 5-325 MG Oral Tablet; Take 1-2 tablets every 4-6  hours for pain Health Maintenance  2. UA With REFLEX; [Do Not Release]; Status:Resulted - Requires Verification;   [id number] 04:07PM  1. continue with plan for JJ change Monday.   2. Discuss Perc tube plans with IR.   3. Hydrocodone script ( #30) given.   Discussion/Summary  cc: Dr. Felipa Eth  cc: Dr. Erling Cruz     Signatures Electronically signed by : Carolan Clines, M.D.; Apr 27 2015  5:36PM EST

## 2015-05-03 NOTE — Progress Notes (Signed)
ANTIBIOTIC CONSULT NOTE - INITIAL  Pharmacy Consult for zosyn Indication: Sepsis due to pyelonephritis  Allergies  Allergen Reactions  . Vicodin [Hydrocodone-Acetaminophen] Nausea And Vomiting  . Percocet [Oxycodone-Acetaminophen] Nausea And Vomiting    Patient Measurements: Height: 5\' 3"  (160 cm) Weight: 181 lb 4.8 oz (82.237 kg) IBW/kg (Calculated) : 52.4 Adjusted Body Weight:   Vital Signs: Temp: 98.3 F (36.8 C) (11/12 2246) Temp Source: Oral (11/12 2246) BP: 105/55 mmHg (11/12 2246) Pulse Rate: 86 (11/12 2246) Intake/Output from previous day:   Intake/Output from this shift:    Labs:  Recent Labs  04/30/15 1046 05/02/15 1820  WBC  --  13.8*  HGB 10.4* 11.2*  PLT  --  203  CREATININE  --  4.78*   Estimated Creatinine Clearance: 11.1 mL/min (by C-G formula based on Cr of 4.78). No results for input(s): VANCOTROUGH, VANCOPEAK, VANCORANDOM, GENTTROUGH, GENTPEAK, GENTRANDOM, TOBRATROUGH, TOBRAPEAK, TOBRARND, AMIKACINPEAK, AMIKACINTROU, AMIKACIN in the last 72 hours.   Microbiology: Recent Results (from the past 720 hour(s))  Urine culture     Status: None   Collection Time: 04/06/15  9:46 PM  Result Value Ref Range Status   Specimen Description URINE, CLEAN CATCH  Final   Special Requests NONE  Final   Culture   Final    MULTIPLE SPECIES PRESENT, SUGGEST RECOLLECTION Performed at [facility]    Report Status 04/08/2015 FINAL  Final  Culture, blood (routine x 2)     Status: None   Collection Time: 04/07/15  1:55 AM  Result Value Ref Range Status   Specimen Description BLOOD LEFT ARM  Final   Special Requests BOTTLES DRAWN AEROBIC AND ANAEROBIC 10CC  Final   Culture   Final    NO GROWTH 5 DAYS Performed at [facility]    Report Status 04/12/2015 FINAL  Final  Culture, blood (routine x 2)     Status: None   Collection Time: 04/07/15  2:00 AM  Result Value Ref Range Status   Specimen Description BLOOD LEFT HAND  Final   Special Requests  BOTTLES DRAWN AEROBIC AND ANAEROBIC 10CC  Final   Culture   Final    NO GROWTH 5 DAYS Performed at [facility]    Report Status 04/12/2015 FINAL  Final    Medical History: Past Medical History  Diagnosis Date  . GERD (gastroesophageal reflux disease)   . Glaucoma   . H/O renal calculi 2002 & 2006  . H/O hiatal hernia   . Headache(784.0)     migraine  . Pneumonia     dx 10-06-2014 per CXR--  on 10-27-2014 pt states finished antibiotic and denies cough or fever  . Hyperlipidemia   . History of MI (myocardial infarction)     10/ 2013 in setting of Septic Shock  . History of atrial fibrillation     10/ 2013  in setting of Septic Shock  . History of CHF (congestive heart failure)     10/ 2013 in setting of septic shock  . Sigmoid diverticulosis   . Nephrolithiasis     bilateral  . CKD (chronic kidney disease), stage III     nephrologist-  dr Florene Glen  . Right ureteral stone   . History of acute respiratory failure     10/ 2013  -- ventilated in setting septic shock  . Dysrhythmia     Afib in 2013 when she had septic shock related to stone ureteral obstruction  . Myocardial infarction ([place])  Was reported in 2013 during hospitalization with septic shock  . Septic shock ([place]) 04/04/2012  . Sepsis ([place])   . Complication of anesthesia     use a little anesthesia , per patient MD states she quit breathing   . Hypertension     medication removed from regimen due to low blood pressure   . Peripheral vascular disease ([place])   . S/P hemodialysis catheter insertion ([place]) 04/11/2015     right anterior chest , only used once   . History of nephrostomy 04/11/2015     currently inplace 04/28/2015   . History of blood transfusion 04/13/2015     Medications:  Anti-infectives    Start     Dose/Rate Route Frequency Ordered Stop   05/03/15 0000  piperacillin-tazobactam (ZOSYN) IVPB 2.25 g     2.25 g 100 mL/hr over 30 Minutes Intravenous 4 times per day 05/02/15 2227      05/02/15 1800  vancomycin (VANCOCIN) 1,500 mg in sodium chloride 0.9 % 500 mL IVPB     1,500 mg 250 mL/hr over 120 Minutes Intravenous  Once 05/02/15 1757 05/02/15 2104   05/02/15 1800  piperacillin-tazobactam (ZOSYN) IVPB 3.375 g     3.375 g 100 mL/hr over 30 Minutes Intravenous  Once 05/02/15 1757 05/02/15 1903     Assessment: Patient with Sepsis due to pyelonephritis.  First dose of antibiotics already given in ED.  Patient with very poor renal function.    Goal of Therapy:  Zosyn based on renal function Appropriate antibiotic dosing for renal function; eradication of infection   Plan:  Follow up culture results Zosyn 2.25gm iv q6hr  Tyler Deis, Shea Stakes [place] 05/03/2015,4:37 AM

## 2015-05-03 NOTE — Progress Notes (Addendum)
[facility] Progress Note    Patient: [name]  [id number]  DOB: 01-11-1946  DOA: 05/02/2015 Date of Service: the patient was seen and examined on 05/03/2015  Subjective: Patient continues to complain of some fatigue and tiredness. No fever no chills. No abdominal pain no nausea no vomiting. Nutrition: Able to tolerate oral diet Activity: Currently bedridden overnight Last BM: Yesterday  Brief Summary of Hospitalization: Day 1 of admission Patient presented last night with complaints of nausea vomiting and abdominal pain. CT of the abdomen was positive for bilateral pyelonephritis. Patient was started on broad-spectrum antibiotics and was admitted on telemetry.  Assessment and Plan 1. Sepsis [facility]) Patient admitted with sepsis. Possibly secondary to pyelonephritis. Patient is currently on Zosyn. Discussed with urology will follow-up with the patient in the morning. Recommended the patient nothing by mouth after midnight. Patient may undergo possible stent placement in the morning depending on hemodynamic stability. Continue with IV fluids.  2.Nephrolithiasis Status post J stent placement as well as nephrostomy tube placement. Patient is scheduled for outpatient procedure on 05/04/2015. We will follow urology recommendation.  3  Essential hypertension, benign Initially was hypotensive, Blood pressure currently improving. Continue to hold her home medication.  4  Chronic kidney disease, stage IV (severe) (HCC) Serum creatinine remains stable BUN remains elevated. We'll give her IV hydration and recheck the labs in the morning.  5  Normocytic anemia Stable continue to monitor.  6  Chronic diastolic CHF (congestive heart failure) (HCC) Monitor for volume overload. Currently continue IV hydration.  DVT Prophylaxis: subcutaneous Heparin Nutrition: Regular diet Advance goals of care discussion: Full cor  Disposition:  Discharge in 1-2 pending urology  procedure  Consultants: Urology Dr. Alinda Money Procedures: None Antibiotics: Zosyn 05/02/2015 start date,stop date    Intake/Output Summary (Last 24 hours) at 05/03/15 1547 Last data filed at 05/03/15 1100  Gross per 24 hour  Intake 1091.67 ml  Output   1250 ml  Net -158.33 ml   Filed Weights   05/02/15 2245  Weight: 82.237 kg (181 lb 4.8 oz)    Objective: Physical Exam: Filed Vitals:   05/02/15 2245 05/02/15 2246 05/03/15 0457 05/03/15 1400  BP:  105/55 129/63 117/61  Pulse:  86 107 90  Temp:  98.3 F (36.8 C) 100.9 F (38.3 C) 97.7 F (36.5 C)  TempSrc:  Oral Oral Oral  Resp:  16 16 15   Height: 5\' 3"  (1.6 m)     Weight: 82.237 kg (181 lb 4.8 oz)     SpO2:  97% 96% 98%     General: Appear in mild distress Eyes: PERRL ENT: Oral Mucosa moist. Neck: no JVD Cardiovascular: S1 and S2 Present, no Murmur, Peripheral Pulses present Respiratory: Bilateral Air entry no and Clear to Auscultation, no Crackles, no wheezes Abdomen: Bowel Sound present, Soft and no tenderness Skin: no Rash Extremities: no Pedal edema, no calf tenderness Neurologic: Grossly no focal neuro deficit.  Data Reviewed: CBC:  Recent Labs Lab 04/28/15 1025 04/30/15 1046 05/02/15 1820 05/03/15 0603  WBC 9.6  --  13.8* 11.6*  NEUTROABS  --   --  12.0* 10.1*  HGB 11.1* 10.4* 11.2* 10.4*  HCT 36.5  --  36.0 32.4*  MCV 91.9  --  90.2 90.5  PLT 394  --  203 [id number]   Basic Metabolic Panel:  Recent Labs Lab 04/28/15 1025 05/02/15 1820 05/03/15 0603  NA 140 135 137  K 4.8 4.0 3.8  CL 104 104 108  CO2 26 17* 21*  GLUCOSE 82 108* 119*  BUN 64* 62* 63*  CREATININE 4.81* 4.78* 4.48*  CALCIUM 10.7* 9.9 9.3   Liver Function Tests:  Recent Labs Lab 05/02/15 1820  AST 15  ALT 10*  ALKPHOS 70  BILITOT 0.4  PROT 6.9  ALBUMIN 3.4*   No results for input(s): LIPASE, AMYLASE in the last 168 hours. No results for input(s): AMMONIA in the last 168 hours.  Cardiac Enzymes: No results for  input(s): CKTOTAL, CKMB, CKMBINDEX, TROPONINI in the last 168 hours. BNP (last 3 results)  Recent Labs  02/26/15 0530  BNP 35.3    ProBNP (last 3 results) No results for input(s): PROBNP in the last 8760 hours.  CBG: No results for input(s): GLUCAP in the last 168 hours.  No results found for this or any previous visit (from the past 240 hour(s)).   Studies: Ct Abdomen Pelvis Wo Contrast  05/02/2015  CLINICAL DATA:  Shaking chills and slurring words today. Right nephrostomy tube in place. EXAM: CT ABDOMEN AND PELVIS WITHOUT CONTRAST TECHNIQUE: Multidetector CT imaging of the abdomen and pelvis was performed following the standard protocol without IV contrast. COMPARISON:  CT abdomen and pelvis 02/20/2015 and 04/06/2015. FINDINGS: Hiatal hernia is identified. No pleural or pericardial effusion. There is some atelectasis or scar in the left lung base. Right nephrostomy tube and right ureteral stent are in place. The tubes are well-positioned. There is no right hydronephrosis. A tiny amount of air is seen in the right intrarenal collecting system likely related to the nephrostomy tube. Atrophy of the left kidney is again seen and there is mild fullness of the left intrarenal collecting system. There is some stranding about both kidneys. The walls left ureter appear somewhat thickened. Urinary bladder is decompressed scratch the urinary bladder is nearly completely decompressed. The gallbladder is been removed. The liver, spleen, adrenal glands and pancreas are unremarkable. The patient is status post hysterectomy. Fat containing ventral hernias are again seen and unchanged. The largest hernias in the pelvis. Diverticulosis appears worst in the sigmoid but there is no evidence of diverticulitis. The small bowel and appendix are unremarkable. No fluid collection or lymphadenopathy is seen. Lumbar spondylosis appears worst at L2-3. No lytic or sclerotic bony lesion is identified. IMPRESSION: A  nephrostomy tube in the right kidney is new since the comparison examinations. The right intrarenal collecting system is decompressed. Tiny amount of gas in an upper pole calyx is likely related to the nephrostomy tube. Right double-J ureteral stent remains in place in good position. Mild stranding about both kidneys. Very mild left hydronephrosis and of the walls of the left ureter is unchanged and may be due to infection or inflammation. Findings raise the possibility of pyelonephritis and/or ureteritis. Diverticulosis without diverticulitis. Hiatal hernia and fat containing ventral hernias, largest in the pelvis, are unchanged. Electronically Signed   By: Inge Rise M.D.   On: 05/02/2015 20:12   Dg Chest 2 View  05/02/2015  CLINICAL DATA:  Acute onset of fever and shortness of breath. Initial encounter. EXAM: CHEST  2 VIEW COMPARISON:  Chest radiograph performed 02/25/2015 FINDINGS: The lungs are well-aerated and clear. There is no evidence of focal opacification, pleural effusion or pneumothorax. The heart is normal in size; the mediastinal contour is within normal limits. No acute osseous abnormalities are seen. A large hiatal hernia is noted, partially filled with air. A right-sided dual-lumen catheter is noted ending about the mid SVC. IMPRESSION: 1. No acute cardiopulmonary process are seen. 2. Large hiatal hernia  noted. Electronically Signed   By: Garald Balding M.D.   On: 05/02/2015 18:20    Scheduled Meds: . famotidine  20 mg Oral Daily  . heparin subcutaneous  5,000 Units Subcutaneous 3 times per day  . latanoprost  1 drop Both Eyes QHS  . piperacillin-tazobactam (ZOSYN)  IV  2.25 g Intravenous 4 times per day  . saccharomyces boulardii  250 mg Oral BID  . sodium chloride  500 mL Intravenous Once  . timolol  1 drop Both Eyes q morning - 10a   Continuous Infusions: . sodium chloride 1,000 mL (05/02/15 2235)    Time spent: 20 minutes.  Author: Berle Mull, MD [facility] Pager: [phone] 05/03/2015 3:47 PM  If 7PM-7AM, please contact night-coverage at [email or web address], password [facility]

## 2015-05-04 ENCOUNTER — Inpatient Hospital Stay (HOSPITAL_COMMUNITY): Payer: Medicare Other | Admitting: Certified Registered Nurse Anesthetist

## 2015-05-04 ENCOUNTER — Ambulatory Visit (HOSPITAL_COMMUNITY): Admission: RE | Admit: 2015-05-04 | Payer: Medicare Other | Source: Ambulatory Visit | Admitting: Urology

## 2015-05-04 ENCOUNTER — Encounter (HOSPITAL_COMMUNITY)
Admission: EM | Disposition: A | Discharge status: Home Health | Payer: Self-pay | Source: Home / Self Care | Attending: Internal Medicine

## 2015-05-04 ENCOUNTER — Encounter (HOSPITAL_COMMUNITY): Payer: Self-pay | Admitting: Certified Registered Nurse Anesthetist

## 2015-05-04 DIAGNOSIS — E876 Hypokalemia: Secondary | ICD-10-CM

## 2015-05-04 HISTORY — PX: CYSTOSCOPY W/ URETERAL STENT PLACEMENT: SHX1429

## 2015-05-04 LAB — URINE CULTURE

## 2015-05-04 LAB — COMPREHENSIVE METABOLIC PANEL
ALT: 11 U/L — ABNORMAL LOW (ref 14–54)
AST: 11 U/L — ABNORMAL LOW (ref 15–41)
Albumin: 2.8 g/dL — ABNORMAL LOW (ref 3.5–5.0)
Alkaline Phosphatase: 53 U/L (ref 38–126)
Anion gap: 8 (ref 5–15)
BUN: 50 mg/dL — ABNORMAL HIGH (ref 6–20)
CO2: 20 mmol/L — ABNORMAL LOW (ref 22–32)
Calcium: 9 mg/dL (ref 8.9–10.3)
Chloride: 107 mmol/L (ref 101–111)
Creatinine, Ser: 4.41 mg/dL — ABNORMAL HIGH (ref 0.44–1.00)
GFR calc Af Amer: 11 mL/min — ABNORMAL LOW (ref >60)
GFR calc non Af Amer: 9 mL/min — ABNORMAL LOW (ref >60)
Glucose, Bld: 100 mg/dL — ABNORMAL HIGH (ref 65–99)
Potassium: 3.4 mmol/L — ABNORMAL LOW (ref 3.5–5.1)
Sodium: 135 mmol/L (ref 135–145)
Total Bilirubin: 0.5 mg/dL (ref 0.3–1.2)
Total Protein: 5.9 g/dL — ABNORMAL LOW (ref 6.5–8.1)

## 2015-05-04 LAB — CBC WITH DIFFERENTIAL/PLATELET
Basophils Absolute: 0 10*3/uL (ref 0.0–0.1)
Basophils Relative: 1 %
Eosinophils Absolute: 0.3 10*3/uL (ref 0.0–0.7)
Eosinophils Relative: 4 %
HCT: 30.9 % — ABNORMAL LOW (ref 36.0–46.0)
Hemoglobin: 9.6 g/dL — ABNORMAL LOW (ref 12.0–15.0)
Lymphocytes Relative: 11 %
Lymphs Abs: 0.8 10*3/uL (ref 0.7–4.0)
MCH: 27.8 pg (ref 26.0–34.0)
MCHC: 31.1 g/dL (ref 30.0–36.0)
MCV: 89.6 fL (ref 78.0–100.0)
Monocytes Absolute: 0.7 10*3/uL (ref 0.1–1.0)
Monocytes Relative: 9 %
Neutro Abs: 5.6 10*3/uL (ref 1.7–7.7)
Neutrophils Relative %: 75 %
Platelets: 190 10*3/uL (ref 150–400)
RBC: 3.45 MIL/uL — ABNORMAL LOW (ref 3.87–5.11)
RDW: 17.6 % — ABNORMAL HIGH (ref 11.5–15.5)
WBC: 7.4 10*3/uL (ref 4.0–10.5)

## 2015-05-04 LAB — PROTIME-INR
INR: 1.24 (ref 0.00–1.49)
Prothrombin Time: 15.7 seconds — ABNORMAL HIGH (ref 11.6–15.2)

## 2015-05-04 SURGERY — CYSTOSCOPY, WITH RETROGRADE PYELOGRAM AND URETERAL STENT INSERTION
Anesthesia: General | Site: Urethra | Laterality: Bilateral

## 2015-05-04 MED ORDER — FENTANYL CITRATE (PF) 100 MCG/2ML IJ SOLN
INTRAMUSCULAR | Status: AC
  Filled 2015-05-04: qty 4

## 2015-05-04 MED ORDER — FENTANYL CITRATE (PF) 100 MCG/2ML IJ SOLN: 25.0000 ug | INTRAMUSCULAR | Status: DC | PRN

## 2015-05-04 MED ORDER — LACTATED RINGERS IV SOLN
INTRAVENOUS | Status: DC | PRN
  Administered 2015-05-04: 11:00:00 via INTRAVENOUS

## 2015-05-04 MED ORDER — PROPOFOL 10 MG/ML IV BOLUS
INTRAVENOUS | Status: DC | PRN
  Administered 2015-05-04: 150 mg via INTRAVENOUS

## 2015-05-04 MED ORDER — FENTANYL CITRATE (PF) 100 MCG/2ML IJ SOLN
INTRAMUSCULAR | Status: DC | PRN
  Administered 2015-05-04 (×4): 25 ug via INTRAVENOUS

## 2015-05-04 MED ORDER — SODIUM CHLORIDE 0.45 % IV SOLN
INTRAVENOUS | Status: DC
  Administered 2015-05-04: 14:00:00 via INTRAVENOUS

## 2015-05-04 MED ORDER — LIDOCAINE HCL (CARDIAC) 20 MG/ML IV SOLN
INTRAVENOUS | Status: AC
  Filled 2015-05-04: qty 5

## 2015-05-04 MED ORDER — MEPERIDINE HCL 50 MG/ML IJ SOLN: 6.2500 mg | INTRAMUSCULAR | Status: DC | PRN

## 2015-05-04 MED ORDER — MIDAZOLAM HCL 2 MG/2ML IJ SOLN
INTRAMUSCULAR | Status: AC
  Filled 2015-05-04: qty 4

## 2015-05-04 MED ORDER — ONDANSETRON HCL 4 MG/2ML IJ SOLN
INTRAMUSCULAR | Status: DC | PRN
  Administered 2015-05-04: 4 mg via INTRAVENOUS

## 2015-05-04 MED ORDER — STERILE WATER FOR IRRIGATION IR SOLN
Status: DC | PRN
  Administered 2015-05-04: 3000 mL via INTRAVESICAL

## 2015-05-04 MED ORDER — LIDOCAINE HCL (CARDIAC) 20 MG/ML IV SOLN
INTRAVENOUS | Status: DC | PRN
  Administered 2015-05-04: 100 mg via INTRAVENOUS

## 2015-05-04 MED ORDER — IOHEXOL 300 MG/ML  SOLN
INTRAMUSCULAR | Status: DC | PRN
  Administered 2015-05-04: 10 mL via URETHRAL

## 2015-05-04 MED ORDER — ONDANSETRON HCL 4 MG/2ML IJ SOLN
INTRAMUSCULAR | Status: AC
  Filled 2015-05-04: qty 2

## 2015-05-04 MED ORDER — PROMETHAZINE HCL 25 MG/ML IJ SOLN: 6.2500 mg | INTRAMUSCULAR | Status: DC | PRN

## 2015-05-04 MED ORDER — PROPOFOL 10 MG/ML IV BOLUS
INTRAVENOUS | Status: AC
  Filled 2015-05-04: qty 20

## 2015-05-04 MED ORDER — POTASSIUM CHLORIDE CRYS ER 20 MEQ PO TBCR
20.0000 meq | EXTENDED_RELEASE_TABLET | Freq: Once | ORAL | Status: AC
  Administered 2015-05-04: 20 meq via ORAL
  Filled 2015-05-04 (×2): qty 1

## 2015-05-04 SURGICAL SUPPLY — 18 items
BAG URO CATCHER STRL LF (DRAPE) ×3 IMPLANT
CATH INTERMIT  6FR 70CM (CATHETERS) ×3 IMPLANT
CLOTH BEACON ORANGE TIMEOUT ST (SAFETY) ×3 IMPLANT
FIBER LASER FLEXIVA 1000 (UROLOGICAL SUPPLIES) IMPLANT
FIBER LASER FLEXIVA 200 (UROLOGICAL SUPPLIES) IMPLANT
FIBER LASER FLEXIVA 365 (UROLOGICAL SUPPLIES) IMPLANT
FIBER LASER FLEXIVA 550 (UROLOGICAL SUPPLIES) IMPLANT
FIBER LASER TRAC TIP (UROLOGICAL SUPPLIES) IMPLANT
GLOVE BIOGEL M STRL SZ7.5 (GLOVE) ×3 IMPLANT
GOWN STRL REUS W/TWL XL LVL3 (GOWN DISPOSABLE) ×3 IMPLANT
GUIDEWIRE STR DUAL SENSOR (WIRE) ×3 IMPLANT
MANIFOLD NEPTUNE II (INSTRUMENTS) ×3 IMPLANT
NS IRRIG 1000ML POUR BTL (IV SOLUTION) ×3 IMPLANT
PACK CYSTO (CUSTOM PROCEDURE TRAY) ×3 IMPLANT
SCRUB PCMX 4 OZ (MISCELLANEOUS) ×3 IMPLANT
STENT URET 6FRX24 CONTOUR (STENTS) ×4 IMPLANT
TUBING CONNECTING 10 (TUBING) ×2 IMPLANT
TUBING CONNECTING 10' (TUBING) ×1

## 2015-05-04 NOTE — Progress Notes (Signed)
Subjective: Recurrent pyelonephritis with hos[pital admission. CT is reviewed and shows L sided ureteritis and mild hydronephrosis. Her Right side is drained both by a JJ stent and a nephrostomy tube. L side works only 28%.   Meds: Zosyn C/s: mixed flora.  Objective: Vital signs in last 24 hours: Temp:  [97.7 F (36.5 C)-98.3 F (36.8 C)] 98.3 F (36.8 C) (11/14 0500) Pulse Rate:  [77-90] 77 (11/14 0500) Resp:  [15] 15 (11/14 0500) BP: (108-117)/(56-68) 108/56 mmHg (11/14 0500) SpO2:  [96 %-98 %] 96 % (11/14 0500)A  Intake/Output from previous day: 11/13 0701 - 11/14 0700 In: 2501.7 [I.V.:2351.7; IV Piggyback:150] Out: 2325 [Urine:2325] Intake/Output this shift: Total I/O In: -  Out: 550 [Urine:550]  Past Medical History  Diagnosis Date  . GERD (gastroesophageal reflux disease)   . Glaucoma   . H/O renal calculi 2002 & 2006  . H/O hiatal hernia   . Headache(784.0)     migraine  . Pneumonia     dx 10-06-2014 per CXR--  on 10-27-2014 pt states finished antibiotic and denies cough or fever  . Hyperlipidemia   . History of MI (myocardial infarction)     10/ 2013 in setting of Septic Shock  . History of atrial fibrillation     10/ 2013  in setting of Septic Shock  . History of CHF (congestive heart failure)     10/ 2013 in setting of septic shock  . Sigmoid diverticulosis   . Nephrolithiasis     bilateral  . CKD (chronic kidney disease), stage III     nephrologist-  dr Florene Glen  . Right ureteral stone   . History of acute respiratory failure     10/ 2013  -- ventilated in setting septic shock  . Dysrhythmia     Afib in 2013 when she had septic shock related to stone ureteral obstruction  . Myocardial infarction [facility])     Was reported in 2013 during hospitalization with septic shock  . Septic shock ([place]) 04/04/2012  . Sepsis ([place])   . Complication of anesthesia     use a little anesthesia , per patient MD states she quit breathing   . Hypertension     medication  removed from regimen due to low blood pressure   . Peripheral vascular disease ([place])   . S/P hemodialysis catheter insertion ([place]) 04/11/2015     right anterior chest , only used once   . History of nephrostomy 04/11/2015     currently inplace 04/28/2015   . History of blood transfusion 04/13/2015     Physical Exam:  Lungs - Normal respiratory effort, chest expands symmetrically.  Abdomen - Soft, non-tender & non-distended.  Lab Results:  Recent Labs  05/02/15 1820 05/03/15 0603 05/04/15 0435  WBC 13.8* 11.6* 7.4  HGB 11.2* 10.4* 9.6*  HCT 36.0 32.4* 30.9*   BMET  Recent Labs  05/03/15 0603 05/04/15 0435  NA 137 135  K 3.8 3.4*  CL 108 107  CO2 21* 20*  GLUCOSE 119* 100*  BUN 63* 50*  CREATININE 4.48* 4.41*  CALCIUM 9.3 9.0   No results for input(s): LABURIN in the last 72 hours. Results for orders placed or performed during the hospital encounter of 04/06/15  Urine culture     Status: None   Collection Time: 04/06/15  9:46 PM  Result Value Ref Range Status   Specimen Description URINE, CLEAN CATCH  Final   Special Requests NONE  Final   Culture   Final  MULTIPLE SPECIES PRESENT, SUGGEST RECOLLECTION Performed at [facility]    Report Status 04/08/2015 FINAL  Final  Culture, blood (routine x 2)     Status: None   Collection Time: 04/07/15  1:55 AM  Result Value Ref Range Status   Specimen Description BLOOD LEFT ARM  Final   Special Requests BOTTLES DRAWN AEROBIC AND ANAEROBIC 10CC  Final   Culture   Final    NO GROWTH 5 DAYS Performed at [facility]    Report Status 04/12/2015 FINAL  Final  Culture, blood (routine x 2)     Status: None   Collection Time: 04/07/15  2:00 AM  Result Value Ref Range Status   Specimen Description BLOOD LEFT HAND  Final   Special Requests BOTTLES DRAWN AEROBIC AND ANAEROBIC 10CC  Final   Culture   Final    NO GROWTH 5 DAYS Performed at [facility]    Report Status 04/12/2015 FINAL  Final     Studies/Results: NM Renal Imaging Flow W/Pharm   Status: Final result       PACS Images     Show images for NM Renal Imaging Flow W/Pharm     Study Result     CLINICAL DATA: Acute kidney disease. History of left renal atrophy and chronic right hydronephrosis.  EXAM: NUCLEAR MEDICINE RENAL SCAN WITH DIURETIC ADMINISTRATION  TECHNIQUE: Radionuclide angiographic and sequential renal images were obtained after intravenous injection of radiopharmaceutical. Imaging was continued during slow intravenous injection of Lasix approximately 15 minutes after the start of the examination.  RADIOPHARMACEUTICALS: 13.9 mCi Technetium-59m MAG3 IV  COMPARISON: None.  FINDINGS: Flow: Decreased perfusion to the smaller left kidney. Normal perfusion to the right kidney.  Left renogram: Diminished cortical uptake by the small left kidney. There is delayed excretion of the radiopharmaceutical.  Right renogram: Normal cortical uptake. There is diminished clearance of the radiopharmaceutical from the dilated right renal collecting system.  Differential:  Left kidney = 28 %  Right kidney = 72 %  T1/2 post Lasix :  Left kidney = not achieved min  Right kidney = not achieved. Min  IMPRESSION: 1. Diminished left renal function. There is delayed perfusion, cortical uptake and excretion of the radiopharmaceutical by the small left kidney. 2. Delayed and diminished clearance of the radiopharmaceutical from the dilated right renal collecting system compatible with partial obstruction. 3. Split renal function is equal to 72% from the right kidney and 28% from the left kidney.   Electronically Signed  By: Kerby Moors M.D.  On: 04/08/2015 16:34           Vitals     Height Weight BMI (Calculated)    5\' 3"  (1.6 m) 81.194 kg (179 lb) 31.8      Interpretation Summary     CLINICAL DATA: Acute kidney disease. History of left renal  atrophy and chronic right hydronephrosis.  EXAM: NUCLEAR MEDICINE RENAL SCAN WITH DIURETIC ADMINISTRATION  TECHNIQUE: Radionuclide angiographic and sequential renal images were obtained after intravenous injection of radiopharmaceutical. Imaging was continued during slow intravenous injection of Lasix approximately 15 minutes after the start of the examination.  RADIOPHARMACEUTICALS: 13.9 mCi Technetium-58m MAG3 IV  COMPARISON: None.  FINDINGS: Flow: Decreased perfusion to the smaller left kidney. Normal perfusion to the right kidney.  Left renogram: Diminished cortical uptake by the small left kidney. There is delayed excretion of the radiopharmaceutical.  Right renogram: Normal cortical uptake. There is diminished clearance of the radiopharmaceutical from the dilated right renal collecting  system.  Differential:  Left kidney = 28 %  Right kidney = 72 %  T1/2 post Lasix :  Left kidney = not achieved min  Right kidney = not achieved. Min  IMPRESSION: 1. Diminished left renal function. There is delayed perfusion, cortical uptake and excretion of the radiopharmaceutical by the small left kidney. 2. Delayed and diminished clearance of the radiopharmaceutical from the dilated right renal collecting system compatible with partial obstruction. 3. Split renal function is equal to 72% from the right kidney and 28% from the left kidney.   Electronically Signed  By: Kerby Moors M.D.  On: 04/08/2015 16:34                                                   Exam Begun   Exam Ended       04/08/2015 2:05 PM 04/08/2015 4:12 PM                                                                                                                                                                                                                                                                                                                                Assessment: Stage 4 CKD, admitted per  ED for  Possible sepsis. She is stable today for exchange of her Right JJ stent. While in hospital, I will have IR exchange her Right perc nephrostomy tube. I have reviewed her CT myself, and note L ureteritis, and mild hydro, and will go ahead and pass L JJ stent for maximum decompression.   Plan: For OR this AM.  Azzan Butler I Ayodele Hartsock 05/04/2015, 8:03 AM

## 2015-05-04 NOTE — Op Note (Signed)
Pre-operative diagnosis :   Chronic Kidney Disease-Stage 4, elevated Creatinine, chronic Right ureteral stent, Left ureteritis, hydronephrosis  Postoperative diagnosis:  same  Operation:  Cystourethroscopy, removal of right double-J stent, right retrograde pyelogram, with interpretation, placement of right double-J catheter into the right renal pelvis, left retrograde pyelogram with Interpretation, placement of left double-J stent into the left renal pelvis (6 [place] by 24 cm).  Vaginal examination under anesthesia.    Surgeon:  Chauncey Cruel. Gaynelle Arabian, MD  First assistant:  None   Anesthesia: Gen. LMA  Preparation:  After appropriate pre-anesthesia, the patient was brought to the operative room, placed on the operating table in the dorsal supine position where general LMA anesthesia was introduced. She remained stable, and was replaced in the dorsal lithotomy position with the pubis was prepped with Betadine solution and draped in the usual fashion.   Review history:  The patient is a 69 year old female with chronic kidney disease, stage IV. Her baseline creatinine is 4.5 area the patient has both a right double-J stent stent in place, as well as a right percutaneous nephrostomy tube in place. Her most recent CT shows left ureteritis, as well as hydronephrosis. She has Lasix renal scan showing maximum function of 28% on the left side. She has been admitted within the last 2 days for probable sepsis, with fever, and confusion. She has been treated with Zosyn, and hydration, with improvement. She is now for double-J catheter change on her right side. It is noted that the right-sided double-J catheter is currently in the upper pole, and the percutaneous nephrostomy tube is in the lower pole calyx.   Statement of  Likelihood of Success: Excellent. TIME-OUT observed.:  Procedure:  During preparation, the patient's underwear was removed, and the patient was found to have what brownish granular substance which  appeared to be stool, at the level of the vagina. This will be inspected later as a vaginal examination to look for a rectovaginal fistula.  Cystoscopy was Compass, which shows normal-appearing urethra. The right double-J catheter is identified, and is removed. Guidewires passed through the catheter, and the catheter is noted to be patent.  Retrograde pyelogram shows normal-appearing ureter. There is no hydronephrosis noted. A 0.038 guidewire was then passed into the renal pelvis, and over this, a 6 [place] by 24 7 m catheter is placed, and manipulated so that the coil is in the renal pelvis. Photodocumentation is accomplished.  Retrograde pyelogram was then performed on the left side, which also shows a normal renal pelvis. No stones are identified. A 0.038 guidewire was then passed into the left renal pelvis, and a 6 [place] by 24 cm double-J stent is passed and coiled into the renal pelvis, and photo documentation is accomplished showing both the coils of the stents in good position.  Following placement of bilateral double-J stents, vaginal examination is accomplished using the cystoscopy light for vaginal light. No evidence of rectovaginal fistula could be identified. The vaginal tissue appears pink and healthy. There is no stool identified within the vagina. The vaginal urethral tissue appears to be within normal limits. There is a urethrocele noted, but no abnormal vaginal tissue noted.  The nephrostomy tube had been placed to clamp, and will remain clamped to see how the double-J stent drains. Her outputs will be followed, and her creatinine will be followed.  The patient was awakened and taken to recovery room in good condition. The concludes of [facility] Caren Griffins is Dr. Mathis Bud

## 2015-05-04 NOTE — Transfer of Care (Signed)
Immediate Anesthesia Transfer of Care Note  Patient: [name]  Procedure(s) Performed: Procedure(s): CYSTOSCOPY WITH BILATERAL RETROGRADE PYELOGRAM/ WITH INTERPRETATION, EXCHANGE OF RIGHT URETERAL STENT REPLACEMENT AND PLACEMENT LEFT URETERAL STENT PLACEMENT EXAMINATION OF VAGINA (Bilateral)  Patient Location: PACU  Anesthesia Type:General  Level of Consciousness:  sedated, patient cooperative and responds to stimulation  Airway & Oxygen Therapy:Patient Spontanous Breathing and Patient connected to face mask oxgen  Post-op Assessment:  Report given to PACU RN and Post -op Vital signs reviewed and stable  Post vital signs:  Reviewed and stable  Last Vitals:  Filed Vitals:   05/04/15 0500  BP: 108/56  Pulse: 77  Temp: 36.8 C  Resp: 15    Complications: No apparent anesthesia complications

## 2015-05-04 NOTE — Interval H&P Note (Signed)
History and Physical Interval Note:  05/04/2015 10:19 AM  [name]  has presented today for surgery, with the diagnosis of RIGHT HYDRONEPHROSIS  The various methods of treatment have been discussed with the patient and family. After consideration of risks, benefits and other options for treatment, the patient has consented to  Procedure(s): CYSTOSCOPY WITH BILATERAL RETROGRADE PYELOGRAM/ RIGHT URETERAL STENT REPLACEMENT AND LEFT URETERAL STENT PLACEMENT (Right) as a surgical intervention .  The patient's history has been reviewed, patient examined, no change in status, stable for surgery.  I have reviewed the patient's chart and labs.  Questions were answered to the patient's satisfaction.     Lanett Lasorsa [name]

## 2015-05-04 NOTE — Progress Notes (Signed)
[facility] Progress Note    Patient: [name]    [id number]  DOB: [birth year]     DOA: 05/02/2015 Date of Service: the patient was seen and examined on 05/04/2015  Subjective:  No abdominal pain no nausea no vomiting. Did have a lose BM yesterday. Had some blood in the catheter bag yesterday.  Nutrition: Able to tolerate oral diet, npo last night Activity: ambulated in the hallway with assistance  Last BM: 05/03/15  Brief Summary of Hospitalization: Day 2 of admission Patient presented last night with complaints of nausea vomiting and abdominal pain. CT of the abdomen was positive for bilateral pyelonephritis. Patient was started on broad-spectrum antibiotics and was admitted on telemetry.  Assessment and Plan 1. Sepsis [facility]) Patient admitted with sepsis. The pt meets SIRS criteria Tmax 39.6 C/ 103.2 F Tachycardia Leucocytosis Possibly secondary to pyelonephritis based on CT scan.  Patient is currently on Zosyn.  Planned to have stent exchange today. Continue with IV fluids. Replacing potassium. Has been known to have complication with anesthesia and dr Gaynelle Arabian aware of it.  2.Nephrolithiasis Status post J stent placement as well as nephrostomy tube placement. Patient is scheduled for stent exchange procedure on 05/03/2015. We will follow urology recommendation.  3  Essential hypertension, benign Initially was hypotensive, Blood pressure currently improving. Continue to hold her home medication.  4  Chronic kidney disease, stage IV (severe) (HCC) Serum creatinine remains stable, BUN is also improving with hydration. Continue her IV hydration and recheck the labs in the morning.  5  Normocytic anemia Stable continue to monitor.  6  Chronic diastolic CHF (congestive heart failure) (HCC) Monitor for volume overload. Currently continue IV hydration.  7. Hypokalemia Replacing prior to procedure.  DVT Prophylaxis: subcutaneous Heparin on hold last  night for procedure. Nutrition: npo for procedure, resume later.  Advance goals of care discussion: Full code  Disposition:  Discharge in 1-2 days pending urology procedure  Consultants: Urology Dr. Alinda Money, Dr Gaynelle Arabian  Procedures: None Antibiotics: Zosyn 05/02/2015 start date,stop date    Intake/Output Summary (Last 24 hours) at 05/04/15 0715 Last data filed at 05/04/15 0600  Gross per 24 hour  Intake 2501.67 ml  Output   2325 ml  Net 176.67 ml   Filed Weights   05/02/15 2245  Weight: 82.237 kg (181 lb 4.8 oz)    Objective: Physical Exam: Filed Vitals:   05/03/15 0457 05/03/15 1400 05/03/15 1923 05/04/15 0500  BP: 129/63 117/61 111/68 108/56  Pulse: 107 90 90 77  Temp: 100.9 F (38.3 C) 97.7 F (36.5 C) 98.2 F (36.8 C) 98.3 F (36.8 C)  TempSrc: Oral Oral Oral Oral  Resp: 16 15 15 15   Height:      Weight:      SpO2: 96% 98% 97% 96%     General: Appear in mild distress ENT: Oral Mucosa moist. Cardiovascular: S1 and S2 Present, no Murmur, Peripheral Pulses present Respiratory: Bilateral Air entry no and Clear to Auscultation, no Crackles, no wheezes Abdomen: Bowel Sound present, Soft and no tenderness Extremities: no Pedal edema, no calf tenderness Neurologic: Grossly no focal neuro deficit.  Data Reviewed: CBC:  Recent Labs Lab 04/28/15 1025 04/30/15 1046 05/02/15 1820 05/03/15 0603 05/04/15 0435  WBC 9.6  --  13.8* 11.6* 7.4  NEUTROABS  --   --  12.0* 10.1* 5.6  HGB 11.1* 10.4* 11.2* 10.4* 9.6*  HCT 36.5  --  36.0 32.4* 30.9*  MCV 91.9  --  90.2 90.5 89.6  PLT  394  --  203 206 [id number]   Basic Metabolic Panel:  Recent Labs Lab 04/28/15 1025 05/02/15 1820 05/03/15 0603 05/04/15 0435  NA 140 135 137 135  K 4.8 4.0 3.8 3.4*  CL 104 104 108 107  CO2 26 17* 21* 20*  GLUCOSE 82 108* 119* 100*  BUN 64* 62* 63* 50*  CREATININE 4.81* 4.78* 4.48* 4.41*  CALCIUM 10.7* 9.9 9.3 9.0   Liver Function Tests:  Recent Labs Lab 05/02/15 1820  05/04/15 0435  AST 15 11*  ALT 10* 11*  ALKPHOS 70 53  BILITOT 0.4 0.5  PROT 6.9 5.9*  ALBUMIN 3.4* 2.8*   BNP (last 3 results)  Recent Labs  02/26/15 0530  BNP 35.3   Studies: Ct Abdomen Pelvis Wo Contrast  05/02/2015  CLINICAL DATA:  Shaking chills and slurring words today. Right nephrostomy tube in place. EXAM: CT ABDOMEN AND PELVIS WITHOUT CONTRAST TECHNIQUE: Multidetector CT imaging of the abdomen and pelvis was performed following the standard protocol without IV contrast. COMPARISON:  CT abdomen and pelvis 02/20/2015 and 04/06/2015. FINDINGS: Hiatal hernia is identified. No pleural or pericardial effusion. There is some atelectasis or scar in the left lung base. Right nephrostomy tube and right ureteral stent are in place. The tubes are well-positioned. There is no right hydronephrosis. A tiny amount of air is seen in the right intrarenal collecting system likely related to the nephrostomy tube. Atrophy of the left kidney is again seen and there is mild fullness of the left intrarenal collecting system. There is some stranding about both kidneys. The walls left ureter appear somewhat thickened. Urinary bladder is decompressed scratch the urinary bladder is nearly completely decompressed. The gallbladder is been removed. The liver, spleen, adrenal glands and pancreas are unremarkable. The patient is status post hysterectomy. Fat containing ventral hernias are again seen and unchanged. The largest hernias in the pelvis. Diverticulosis appears worst in the sigmoid but there is no evidence of diverticulitis. The small bowel and appendix are unremarkable. No fluid collection or lymphadenopathy is seen. Lumbar spondylosis appears worst at L2-3. No lytic or sclerotic bony lesion is identified. IMPRESSION: A nephrostomy tube in the right kidney is new since the comparison examinations. The right intrarenal collecting system is decompressed. Tiny amount of gas in an upper pole calyx is likely  related to the nephrostomy tube. Right double-J ureteral stent remains in place in good position. Mild stranding about both kidneys. Very mild left hydronephrosis and of the walls of the left ureter is unchanged and may be due to infection or inflammation. Findings raise the possibility of pyelonephritis and/or ureteritis. Diverticulosis without diverticulitis. Hiatal hernia and fat containing ventral hernias, largest in the pelvis, are unchanged. Electronically Signed   By: Inge Rise M.D.   On: 05/02/2015 20:12   Dg Chest 2 View  05/02/2015  CLINICAL DATA:  Acute onset of fever and shortness of breath. Initial encounter. EXAM: CHEST  2 VIEW COMPARISON:  Chest radiograph performed 02/25/2015 FINDINGS: The lungs are well-aerated and clear. There is no evidence of focal opacification, pleural effusion or pneumothorax. The heart is normal in size; the mediastinal contour is within normal limits. No acute osseous abnormalities are seen. A large hiatal hernia is noted, partially filled with air. A right-sided dual-lumen catheter is noted ending about the mid SVC. IMPRESSION: 1. No acute cardiopulmonary process are seen. 2. Large hiatal hernia noted. Electronically Signed   By: Garald Balding M.D.   On: 05/02/2015 18:20    Scheduled Meds: .  famotidine  20 mg Oral Daily  . heparin subcutaneous  5,000 Units Subcutaneous 3 times per day  . latanoprost  1 drop Both Eyes QHS  . piperacillin-tazobactam (ZOSYN)  IV  2.25 g Intravenous 4 times per day  . saccharomyces boulardii  250 mg Oral BID  . sodium chloride  500 mL Intravenous Once  . timolol  1 drop Both Eyes q morning - 10a   Continuous Infusions: . sodium chloride 1,000 mL (05/03/15 2152)    Time spent: 20 minutes.  Author: Berle Mull, MD [facility] Pager: [phone] 05/04/2015 7:15 AM  If 7PM-7AM, please contact night-coverage at [email or web address], password [facility]

## 2015-05-04 NOTE — Progress Notes (Signed)
Nephrostomy tube clamped at 1545.

## 2015-05-04 NOTE — Anesthesia Preprocedure Evaluation (Signed)
Anesthesia Evaluation  Patient identified by MRN, date of birth, ID band Patient awake    Reviewed: Allergy & Precautions, NPO status , Patient's Chart, lab work & pertinent test results  Airway Mallampati: II  TM Distance: >3 FB Neck ROM: Full    Dental no notable dental hx.    Pulmonary pneumonia, resolved,    Pulmonary exam normal breath sounds clear to auscultation       Cardiovascular hypertension, + Past MI and +CHF  Normal cardiovascular exam+ dysrhythmias Atrial Fibrillation  Rhythm:Regular Rate:Normal  Episode of septic shock 2013, MI afib.   Neuro/Psych negative neurological ROS  negative psych ROS   GI/Hepatic Neg liver ROS, GERD  Medicated and Controlled,  Endo/Other  negative endocrine ROS  Renal/GU Renal InsufficiencyRenal disease  negative genitourinary   Musculoskeletal negative musculoskeletal ROS (+)   Abdominal   Peds negative pediatric ROS (+)  Hematology negative hematology ROS (+)   Anesthesia Other Findings   Reproductive/Obstetrics negative OB ROS                             Anesthesia Physical  Anesthesia Plan  ASA: III  Anesthesia Plan: General   Post-op Pain Management:    Induction: Intravenous  Airway Management Planned: LMA  Additional Equipment:   Intra-op Plan:   Post-operative Plan: Extubation in OR  Informed Consent: I have reviewed the patients History and Physical, chart, labs and discussed the procedure including the risks, benefits and alternatives for the proposed anesthesia with the patient or authorized representative who has indicated his/her understanding and acceptance.   Dental advisory given  Plan Discussed with: CRNA  Anesthesia Plan Comments:         Anesthesia Quick Evaluation

## 2015-05-04 NOTE — Anesthesia Procedure Notes (Signed)
Procedure Name: LMA Insertion Date/Time: 05/04/2015 10:48 AM Performed by: Maxwell Caul Pre-anesthesia Checklist: Emergency Drugs available, Patient identified, Suction available and Patient being monitored Patient Re-evaluated:Patient Re-evaluated prior to inductionOxygen Delivery Method: Circle system utilized Preoxygenation: Pre-oxygenation with 100% oxygen Intubation Type: IV induction Ventilation: Mask ventilation without difficulty LMA: LMA inserted LMA Size: 4.0 Number of attempts: 1 Placement Confirmation: positive ETCO2,  CO2 detector and breath sounds checked- equal and bilateral Tube secured with: Tape Dental Injury: Teeth and Oropharynx as per pre-operative assessment

## 2015-05-04 NOTE — H&P (View-Only) (Signed)
Subjective: Recurrent pyelonephritis with hos[pital admission. CT is reviewed and shows L sided ureteritis and mild hydronephrosis. Her Right side is drained both by a JJ stent and a nephrostomy tube. L side works only 28%.   Meds: Zosyn C/s: mixed flora.  Objective: Vital signs in last 24 hours: Temp:  [97.7 F (36.5 C)-98.3 F (36.8 C)] 98.3 F (36.8 C) (11/14 0500) Pulse Rate:  [77-90] 77 (11/14 0500) Resp:  [15] 15 (11/14 0500) BP: (108-117)/(56-68) 108/56 mmHg (11/14 0500) SpO2:  [96 %-98 %] 96 % (11/14 0500)A  Intake/Output from previous day: 11/13 0701 - 11/14 0700 In: 2501.7 [I.V.:2351.7; IV Piggyback:150] Out: 2325 [Urine:2325] Intake/Output this shift: Total I/O In: -  Out: 550 [Urine:550]  Past Medical History  Diagnosis Date  . GERD (gastroesophageal reflux disease)   . Glaucoma   . H/O renal calculi 2002 & 2006  . H/O hiatal hernia   . Headache(784.0)     migraine  . Pneumonia     dx 10-06-2014 per CXR--  on 10-27-2014 pt states finished antibiotic and denies cough or fever  . Hyperlipidemia   . History of MI (myocardial infarction)     10/ 2013 in setting of Septic Shock  . History of atrial fibrillation     10/ 2013  in setting of Septic Shock  . History of CHF (congestive heart failure)     10/ 2013 in setting of septic shock  . Sigmoid diverticulosis   . Nephrolithiasis     bilateral  . CKD (chronic kidney disease), stage III     nephrologist-  dr Florene Glen  . Right ureteral stone   . History of acute respiratory failure     10/ 2013  -- ventilated in setting septic shock  . Dysrhythmia     Afib in 2013 when she had septic shock related to stone ureteral obstruction  . Myocardial infarction [facility])     Was reported in 2013 during hospitalization with septic shock  . Septic shock ([place]) 04/04/2012  . Sepsis ([place])   . Complication of anesthesia     use a little anesthesia , per patient MD states she quit breathing   . Hypertension     medication  removed from regimen due to low blood pressure   . Peripheral vascular disease ([place])   . S/P hemodialysis catheter insertion ([place]) 04/11/2015     right anterior chest , only used once   . History of nephrostomy 04/11/2015     currently inplace 04/28/2015   . History of blood transfusion 04/13/2015     Physical Exam:  Lungs - Normal respiratory effort, chest expands symmetrically.  Abdomen - Soft, non-tender & non-distended.  Lab Results:  Recent Labs  05/02/15 1820 05/03/15 0603 05/04/15 0435  WBC 13.8* 11.6* 7.4  HGB 11.2* 10.4* 9.6*  HCT 36.0 32.4* 30.9*   BMET  Recent Labs  05/03/15 0603 05/04/15 0435  NA 137 135  K 3.8 3.4*  CL 108 107  CO2 21* 20*  GLUCOSE 119* 100*  BUN 63* 50*  CREATININE 4.48* 4.41*  CALCIUM 9.3 9.0   No results for input(s): LABURIN in the last 72 hours. Results for orders placed or performed during the hospital encounter of 04/06/15  Urine culture     Status: None   Collection Time: 04/06/15  9:46 PM  Result Value Ref Range Status   Specimen Description URINE, CLEAN CATCH  Final   Special Requests NONE  Final   Culture   Final  MULTIPLE SPECIES PRESENT, SUGGEST RECOLLECTION Performed at [facility]    Report Status 04/08/2015 FINAL  Final  Culture, blood (routine x 2)     Status: None   Collection Time: 04/07/15  1:55 AM  Result Value Ref Range Status   Specimen Description BLOOD LEFT ARM  Final   Special Requests BOTTLES DRAWN AEROBIC AND ANAEROBIC 10CC  Final   Culture   Final    NO GROWTH 5 DAYS Performed at [facility]    Report Status 04/12/2015 FINAL  Final  Culture, blood (routine x 2)     Status: None   Collection Time: 04/07/15  2:00 AM  Result Value Ref Range Status   Specimen Description BLOOD LEFT HAND  Final   Special Requests BOTTLES DRAWN AEROBIC AND ANAEROBIC 10CC  Final   Culture   Final    NO GROWTH 5 DAYS Performed at [facility]    Report Status 04/12/2015 FINAL  Final     Studies/Results: NM Renal Imaging Flow W/Pharm   Status: Final result       PACS Images     Show images for NM Renal Imaging Flow W/Pharm     Study Result     CLINICAL DATA: Acute kidney disease. History of left renal atrophy and chronic right hydronephrosis.  EXAM: NUCLEAR MEDICINE RENAL SCAN WITH DIURETIC ADMINISTRATION  TECHNIQUE: Radionuclide angiographic and sequential renal images were obtained after intravenous injection of radiopharmaceutical. Imaging was continued during slow intravenous injection of Lasix approximately 15 minutes after the start of the examination.  RADIOPHARMACEUTICALS: 13.9 mCi Technetium-78m MAG3 IV  COMPARISON: None.  FINDINGS: Flow: Decreased perfusion to the smaller left kidney. Normal perfusion to the right kidney.  Left renogram: Diminished cortical uptake by the small left kidney. There is delayed excretion of the radiopharmaceutical.  Right renogram: Normal cortical uptake. There is diminished clearance of the radiopharmaceutical from the dilated right renal collecting system.  Differential:  Left kidney = 28 %  Right kidney = 72 %  T1/2 post Lasix :  Left kidney = not achieved min  Right kidney = not achieved. Min  IMPRESSION: 1. Diminished left renal function. There is delayed perfusion, cortical uptake and excretion of the radiopharmaceutical by the small left kidney. 2. Delayed and diminished clearance of the radiopharmaceutical from the dilated right renal collecting system compatible with partial obstruction. 3. Split renal function is equal to 72% from the right kidney and 28% from the left kidney.   Electronically Signed  By: Kerby Moors M.D.  On: 04/08/2015 16:34           Vitals     Height Weight BMI (Calculated)    5\' 3"  (1.6 m) 81.194 kg (179 lb) 31.8      Interpretation Summary     CLINICAL DATA: Acute kidney disease. History of left renal  atrophy and chronic right hydronephrosis.  EXAM: NUCLEAR MEDICINE RENAL SCAN WITH DIURETIC ADMINISTRATION  TECHNIQUE: Radionuclide angiographic and sequential renal images were obtained after intravenous injection of radiopharmaceutical. Imaging was continued during slow intravenous injection of Lasix approximately 15 minutes after the start of the examination.  RADIOPHARMACEUTICALS: 13.9 mCi Technetium-66m MAG3 IV  COMPARISON: None.  FINDINGS: Flow: Decreased perfusion to the smaller left kidney. Normal perfusion to the right kidney.  Left renogram: Diminished cortical uptake by the small left kidney. There is delayed excretion of the radiopharmaceutical.  Right renogram: Normal cortical uptake. There is diminished clearance of the radiopharmaceutical from the dilated right renal collecting  system.  Differential:  Left kidney = 28 %  Right kidney = 72 %  T1/2 post Lasix :  Left kidney = not achieved min  Right kidney = not achieved. Min  IMPRESSION: 1. Diminished left renal function. There is delayed perfusion, cortical uptake and excretion of the radiopharmaceutical by the small left kidney. 2. Delayed and diminished clearance of the radiopharmaceutical from the dilated right renal collecting system compatible with partial obstruction. 3. Split renal function is equal to 72% from the right kidney and 28% from the left kidney.   Electronically Signed  By: Kerby Moors M.D.  On: 04/08/2015 16:34                                                   Exam Begun   Exam Ended       04/08/2015 2:05 PM 04/08/2015 4:12 PM                                                                                                                                                                                                                                                                                                                                Assessment: Stage 4 CKD, admitted per  ED for  Possible sepsis. She is stable today for exchange of her Right JJ stent. While in hospital, I will have IR exchange her Right perc nephrostomy tube. I have reviewed her CT myself, and note L ureteritis, and mild hydro, and will go ahead and pass L JJ stent for maximum decompression.   Plan: For OR this AM.  Prarthana Parlin I Merrillyn Ackerley 05/04/2015, 8:03 AM

## 2015-05-04 NOTE — Anesthesia Postprocedure Evaluation (Signed)
  Anesthesia Post-op Note  Patient: [name]  Procedure(s) Performed: Procedure(s) (LRB): CYSTOSCOPY WITH BILATERAL RETROGRADE PYELOGRAM/ WITH INTERPRETATION, EXCHANGE OF RIGHT URETERAL STENT REPLACEMENT AND PLACEMENT LEFT URETERAL STENT PLACEMENT EXAMINATION OF VAGINA (Bilateral)  Patient Location: PACU  Anesthesia Type: General  Level of Consciousness: awake and alert   Airway and Oxygen Therapy: Patient Spontanous Breathing  Post-op Pain: mild  Post-op Assessment: Post-op Vital signs reviewed, Patient's Cardiovascular Status Stable, Respiratory Function Stable, Patent Airway and No signs of Nausea or vomiting  Last Vitals:  Filed Vitals:   05/04/15 1230  BP: 138/61  Pulse: 75  Temp: 36.4 C  Resp: 17    Post-op Vital Signs: stable   Complications: No apparent anesthesia complications

## 2015-05-05 ENCOUNTER — Other Ambulatory Visit: Payer: Self-pay

## 2015-05-05 LAB — CBC WITH DIFFERENTIAL/PLATELET
Basophils Absolute: 0 10*3/uL (ref 0.0–0.1)
Basophils Relative: 0 %
Eosinophils Absolute: 0.2 10*3/uL (ref 0.0–0.7)
Eosinophils Relative: 2 %
HCT: 31.4 % — ABNORMAL LOW (ref 36.0–46.0)
Hemoglobin: 9.8 g/dL — ABNORMAL LOW (ref 12.0–15.0)
Lymphocytes Relative: 6 %
Lymphs Abs: 0.6 10*3/uL — ABNORMAL LOW (ref 0.7–4.0)
MCH: 28.2 pg (ref 26.0–34.0)
MCHC: 31.2 g/dL (ref 30.0–36.0)
MCV: 90.5 fL (ref 78.0–100.0)
Monocytes Absolute: 0.6 10*3/uL (ref 0.1–1.0)
Monocytes Relative: 6 %
Neutro Abs: 8.2 10*3/uL — ABNORMAL HIGH (ref 1.7–7.7)
Neutrophils Relative %: 86 %
Platelets: 218 10*3/uL (ref 150–400)
RBC: 3.47 MIL/uL — ABNORMAL LOW (ref 3.87–5.11)
RDW: 17.7 % — ABNORMAL HIGH (ref 11.5–15.5)
WBC: 9.6 10*3/uL (ref 4.0–10.5)

## 2015-05-05 LAB — BASIC METABOLIC PANEL
Anion gap: 8 (ref 5–15)
Anion gap: 9 (ref 5–15)
BUN: 41 mg/dL — ABNORMAL HIGH (ref 6–20)
BUN: 40 mg/dL — ABNORMAL HIGH (ref 6–20)
CO2: 19 mmol/L — ABNORMAL LOW (ref 22–32)
CO2: 20 mmol/L — ABNORMAL LOW (ref 22–32)
Calcium: 9.5 mg/dL (ref 8.9–10.3)
Calcium: 9.3 mg/dL (ref 8.9–10.3)
Chloride: 109 mmol/L (ref 101–111)
Chloride: 106 mmol/L (ref 101–111)
Creatinine, Ser: 4.85 mg/dL — ABNORMAL HIGH (ref 0.44–1.00)
Creatinine, Ser: 4.79 mg/dL — ABNORMAL HIGH (ref 0.44–1.00)
GFR calc Af Amer: 10 mL/min — ABNORMAL LOW (ref >60)
GFR calc Af Amer: 10 mL/min — ABNORMAL LOW (ref >60)
GFR calc non Af Amer: 8 mL/min — ABNORMAL LOW (ref >60)
GFR calc non Af Amer: 8 mL/min — ABNORMAL LOW (ref >60)
Glucose, Bld: 103 mg/dL — ABNORMAL HIGH (ref 65–99)
Glucose, Bld: 113 mg/dL — ABNORMAL HIGH (ref 65–99)
Potassium: 3.9 mmol/L (ref 3.5–5.1)
Potassium: 3.9 mmol/L (ref 3.5–5.1)
Sodium: 136 mmol/L (ref 135–145)
Sodium: 135 mmol/L (ref 135–145)

## 2015-05-05 MED ORDER — ALUM & MAG HYDROXIDE-SIMETH 200-200-20 MG/5ML PO SUSP: 15.0000 mL | ORAL | Status: DC | PRN

## 2015-05-05 MED ORDER — RISAQUAD PO CAPS
2.0000 | ORAL_CAPSULE | Freq: Every day | ORAL | Status: DC
  Administered 2015-05-05 – 2015-05-07 (×3): 2 via ORAL
  Filled 2015-05-05 (×3): qty 2

## 2015-05-05 MED ORDER — LEVOFLOXACIN 250 MG PO TABS
250.0000 mg | ORAL_TABLET | ORAL | Status: DC
  Filled 2015-05-05: qty 1

## 2015-05-05 MED ORDER — NEPRO/CARBSTEADY PO LIQD
237.0000 mL | ORAL | Status: DC
  Administered 2015-05-05 – 2015-05-06 (×2): 237 mL via ORAL
  Filled 2015-05-05 (×3): qty 237

## 2015-05-05 MED ORDER — PIPERACILLIN-TAZOBACTAM IN DEX 2-0.25 GM/50ML IV SOLN
2.2500 g | Freq: Three times a day (TID) | INTRAVENOUS | Status: DC
  Administered 2015-05-05 – 2015-05-07 (×5): 2.25 g via INTRAVENOUS
  Filled 2015-05-05 (×6): qty 50

## 2015-05-05 MED ORDER — PIPERACILLIN-TAZOBACTAM IN DEX 2-0.25 GM/50ML IV SOLN
2.2500 g | Freq: Three times a day (TID) | INTRAVENOUS | Status: DC
  Administered 2015-05-05: 2.25 g via INTRAVENOUS
  Filled 2015-05-05 (×2): qty 50

## 2015-05-05 MED ORDER — CEFPODOXIME PROXETIL 200 MG PO TABS
200.0000 mg | ORAL_TABLET | Freq: Two times a day (BID) | ORAL | Status: DC
  Filled 2015-05-05: qty 1

## 2015-05-05 NOTE — Progress Notes (Signed)
ANTIBIOTIC CONSULT NOTE - follow up  Pharmacy Consult for zosyn Indication: Sepsis due to pyelonephritis  Allergies  Allergen Reactions  . Vicodin [Hydrocodone-Acetaminophen] Nausea And Vomiting  . Percocet [Oxycodone-Acetaminophen] Nausea And Vomiting    Patient Measurements: Height: 5\' 3"  (160 cm) Weight: 181 lb 4.8 oz (82.237 kg) IBW/kg (Calculated) : 52.4 Adjusted Body Weight:   Vital Signs: Temp: 99.7 F (37.6 C) (11/15 0331) Temp Source: Oral (11/15 0331) BP: 118/60 mmHg (11/15 0331) Pulse Rate: 100 (11/15 0331) Intake/Output from previous day: 11/14 0701 - 11/15 0700 In: 2202.5 [P.O.:600; I.V.:1452.5; IV Piggyback:150] Out: 1725 [Urine:1725] Intake/Output from this shift:    Labs:  Recent Labs  05/03/15 0603 05/04/15 0435 05/05/15 0502  WBC 11.6* 7.4 9.6  HGB 10.4* 9.6* 9.8*  PLT 206 190 218  CREATININE 4.48* 4.41* 4.85*   Estimated Creatinine Clearance: 11 mL/min (by C-G formula based on Cr of 4.85). No results for input(s): VANCOTROUGH, VANCOPEAK, VANCORANDOM, GENTTROUGH, GENTPEAK, GENTRANDOM, TOBRATROUGH, TOBRAPEAK, TOBRARND, AMIKACINPEAK, AMIKACINTROU, AMIKACIN in the last 72 hours.   Microbiology: Recent Results (from the past 720 hour(s))  Urine culture     Status: None   Collection Time: 04/06/15  9:46 PM  Result Value Ref Range Status   Specimen Description URINE, CLEAN CATCH  Final   Special Requests NONE  Final   Culture   Final    MULTIPLE SPECIES PRESENT, SUGGEST RECOLLECTION Performed at [facility]    Report Status 04/08/2015 FINAL  Final  Culture, blood (routine x 2)     Status: None   Collection Time: 04/07/15  1:55 AM  Result Value Ref Range Status   Specimen Description BLOOD LEFT ARM  Final   Special Requests BOTTLES DRAWN AEROBIC AND ANAEROBIC 10CC  Final   Culture   Final    NO GROWTH 5 DAYS Performed at [facility]    Report Status 04/12/2015 FINAL  Final  Culture, blood (routine x 2)     Status: None   Collection Time: 04/07/15  2:00 AM  Result Value Ref Range Status   Specimen Description BLOOD LEFT HAND  Final   Special Requests BOTTLES DRAWN AEROBIC AND ANAEROBIC 10CC  Final   Culture   Final    NO GROWTH 5 DAYS Performed at [facility]    Report Status 04/12/2015 FINAL  Final  Culture, blood (routine x 2)     Status: None (Preliminary result)   Collection Time: 05/02/15  6:18 PM  Result Value Ref Range Status   Specimen Description BLOOD LEFT FOREARM  7 ML IN YELLOW BOTTLE  Final   Special Requests NONE  Final   Culture   Final    NO GROWTH 1 DAY Performed at [facility]    Report Status PENDING  Incomplete  Culture, blood (routine x 2)     Status: None (Preliminary result)   Collection Time: 05/02/15  6:25 PM  Result Value Ref Range Status   Specimen Description BLOOD RIGHT ARM  2 ML IN YELLOW BOTTLE  Final   Special Requests NONE  Final   Culture   Final    NO GROWTH 1 DAY Performed at [facility]    Report Status PENDING  Incomplete  Urine culture     Status: None   Collection Time: 05/02/15  8:20 PM  Result Value Ref Range Status   Specimen Description URINE, CLEAN CATCH  Final   Special Requests NONE  Final   Culture   Final  MULTIPLE SPECIES PRESENT, SUGGEST RECOLLECTION Performed at [facility]    Report Status 05/04/2015 FINAL  Final    Medical History: Past Medical History  Diagnosis Date  . GERD (gastroesophageal reflux disease)   . Glaucoma   . H/O renal calculi 2002 & 2006  . H/O hiatal hernia   . Headache(784.0)     migraine  . Pneumonia     dx 10-06-2014 per CXR--  on 10-27-2014 pt states finished antibiotic and denies cough or fever  . Hyperlipidemia   . History of MI (myocardial infarction)     10/ 2013 in setting of Septic Shock  . History of atrial fibrillation     10/ 2013  in setting of Septic Shock  . History of CHF (congestive heart failure)     10/ 2013 in setting of septic shock  . Sigmoid  diverticulosis   . Nephrolithiasis     bilateral  . CKD (chronic kidney disease), stage III     nephrologist-  dr Florene Glen  . Right ureteral stone   . History of acute respiratory failure     10/ 2013  -- ventilated in setting septic shock  . Dysrhythmia     Afib in 2013 when she had septic shock related to stone ureteral obstruction  . Myocardial infarction [facility])     Was reported in 2013 during hospitalization with septic shock  . Septic shock ([place]) 04/04/2012  . Sepsis ([place])   . Complication of anesthesia     use a little anesthesia , per patient MD states she quit breathing   . Hypertension     medication removed from regimen due to low blood pressure   . Peripheral vascular disease ([place])   . S/P hemodialysis catheter insertion ([place]) 04/11/2015     right anterior chest , only used once   . History of nephrostomy 04/11/2015     currently inplace 04/28/2015   . History of blood transfusion 04/13/2015     Medications:  Anti-infectives    Start     Dose/Rate Route Frequency Ordered Stop   05/05/15 1400  piperacillin-tazobactam (ZOSYN) IVPB 2.25 g     2.25 g 100 mL/hr over 30 Minutes Intravenous 3 times per day 05/05/15 0749     05/03/15 0000  piperacillin-tazobactam (ZOSYN) IVPB 2.25 g  Status:  Discontinued     2.25 g 100 mL/hr over 30 Minutes Intravenous 4 times per day 05/02/15 2227 05/05/15 0749   05/02/15 1800  vancomycin (VANCOCIN) 1,500 mg in sodium chloride 0.9 % 500 mL IVPB     1,500 mg 250 mL/hr over 120 Minutes Intravenous  Once 05/02/15 1757 05/02/15 2104   05/02/15 1800  piperacillin-tazobactam (ZOSYN) IVPB 3.375 g     3.375 g 100 mL/hr over 30 Minutes Intravenous  Once 05/02/15 1757 05/02/15 1903     Assessment: Patient with Sepsis due to pyelonephritis.  First dose of antibiotics already given in ED.  Patient with very poor renal function.    Goal of Therapy:  Zosyn based on renal function Appropriate antibiotic dosing for renal function; eradication of  infection   Plan:  Scr continues to rise, decrease frequency of Zosyn to 2.25mg  IV q8h Follow up cultures, LOT   Dolly Rias RPh 05/05/2015, 7:52 AM Pager [phone]

## 2015-05-05 NOTE — Progress Notes (Signed)
[facility]  Patient Status: Active (receiving services up to time of hospitalization)  [facility] is providing the following services: RN  If patient discharges after hours, please call [phone].   Lurlean Leyden 05/05/2015, 2:55 PM

## 2015-05-05 NOTE — Progress Notes (Signed)
Urology Progress Note  1 Day Post-Op   Subjective:  Postop: bil;steral JJ stents with Right stent exchange.      No acute urologic events overnight. Ambulation:   negative Flatus:    positive Bowel movement  positive  Pain: some relief  Objective:  Blood pressure 118/60, pulse 100, temperature 99.7 F (37.6 C), temperature source Oral, resp. rate 20, height 5\' 3"  (1.6 m), weight 82.237 kg (181 lb 4.8 oz), last menstrual period 01/19/1992, SpO2 95 %.  Physical Exam:  General:  No acute distress, awake Extremities: extremities normal, atraumatic, no cyanosis or edema Genitourinary:  neg Foley:  Right JJ stent and L JJ stent. Right perc nephrostomy plugged.     I/O last 3 completed shifts: In: 3402.5 [P.O.:600; I.V.:2652.5; IV Piggyback:150] Out: 2925 [Urine:2925]  Recent Labs     05/04/15  0435  05/05/15  0502  HGB  9.6*  9.8*  WBC  7.4  9.6  PLT  190  218    Recent Labs     05/04/15  0435  05/05/15  0502  NA  135  136  K  3.4*  3.9  CL  107  109  CO2  20*  19*  BUN  50*  41*  CREATININE  4.41*  4.85*  CALCIUM  9.0  9.5  GFRNONAA  9*  8*  GFRAA  11*  10*     Recent Labs     05/04/15  0435  INR  1.24     Invalid input(s): ABG  Assessment/Plan:  Catheter removed. None in her bladder. Could have catheter in bladder if necessary for accurate count. bladdr control is normal today. She had slurring of speech thia AM at 6 : 15 AM, but clear now/    She has bilateral JJ stents. She has plugged right nephrostomy tubes.If her Cr values say the same or go down, then we will discontinue her Right percutaneous nephrostomy tube. If her Cr values go up as before ( to > 8), the we will have IR exchange right perc tube.

## 2015-05-05 NOTE — Care Management Important Message (Signed)
Important Message  Patient Details  Name: TONIA HABECKER MRN: [id number] Date of Birth: 08/28/1945   Medicare Important Message Given:  Yes    Camillo Flaming 05/05/2015, 10:42 AMImportant Message  Patient Details  Name: RAYLINN TRICE MRN: [id number] Date of Birth: [birth year]   Medicare Important Message Given:  Yes    Camillo Flaming 05/05/2015, 10:42 AM

## 2015-05-05 NOTE — Progress Notes (Addendum)
ANTIBIOTIC CONSULT NOTE  Pharmacy Consult for Levofloxacin Indication: UTI  Allergies  Allergen Reactions  . Vicodin [Hydrocodone-Acetaminophen] Nausea And Vomiting  . Percocet [Oxycodone-Acetaminophen] Nausea And Vomiting    Patient Measurements: Height: 5\' 3"  (160 cm) Weight: 181 lb 4.8 oz (82.237 kg) IBW/kg (Calculated) : 52.4  Vital Signs: Temp: 98.1 F (36.7 C) (11/15 1330) Temp Source: Oral (11/15 1330) BP: 125/64 mmHg (11/15 1330) Pulse Rate: 92 (11/15 1330) Intake/Output from previous day: 11/14 0701 - 11/15 0700 In: 2202.5 [P.O.:600; I.V.:1452.5; IV Piggyback:150] Out: 1725 [Urine:1725] Intake/Output from this shift: Total I/O In: 120 [P.O.:120] Out: 101 [Urine:100; Stool:1]  Labs:  Recent Labs  05/03/15 0603 05/04/15 0435 05/05/15 0502  WBC 11.6* 7.4 9.6  HGB 10.4* 9.6* 9.8*  PLT 206 190 218  CREATININE 4.48* 4.41* 4.85*   Estimated Creatinine Clearance: 11 mL/min (by C-G formula based on Cr of 4.85). No results for input(s): VANCOTROUGH, VANCOPEAK, VANCORANDOM, GENTTROUGH, GENTPEAK, GENTRANDOM, TOBRATROUGH, TOBRAPEAK, TOBRARND, AMIKACINPEAK, AMIKACINTROU, AMIKACIN in the last 72 hours.   Microbiology: Recent Results (from the past 720 hour(s))  Urine culture     Status: None   Collection Time: 04/06/15  9:46 PM  Result Value Ref Range Status   Specimen Description URINE, CLEAN CATCH  Final   Special Requests NONE  Final   Culture   Final    MULTIPLE SPECIES PRESENT, SUGGEST RECOLLECTION Performed at [facility]    Report Status 04/08/2015 FINAL  Final  Culture, blood (routine x 2)     Status: None   Collection Time: 04/07/15  1:55 AM  Result Value Ref Range Status   Specimen Description BLOOD LEFT ARM  Final   Special Requests BOTTLES DRAWN AEROBIC AND ANAEROBIC 10CC  Final   Culture   Final    NO GROWTH 5 DAYS Performed at [facility]    Report Status 04/12/2015 FINAL  Final  Culture, blood (routine x 2)     Status: None    Collection Time: 04/07/15  2:00 AM  Result Value Ref Range Status   Specimen Description BLOOD LEFT HAND  Final   Special Requests BOTTLES DRAWN AEROBIC AND ANAEROBIC 10CC  Final   Culture   Final    NO GROWTH 5 DAYS Performed at [facility]    Report Status 04/12/2015 FINAL  Final  Culture, blood (routine x 2)     Status: None (Preliminary result)   Collection Time: 05/02/15  6:18 PM  Result Value Ref Range Status   Specimen Description BLOOD LEFT FOREARM  7 ML IN YELLOW BOTTLE  Final   Special Requests NONE  Final   Culture   Final    NO GROWTH 2 DAYS Performed at [facility]    Report Status PENDING  Incomplete  Culture, blood (routine x 2)     Status: None (Preliminary result)   Collection Time: 05/02/15  6:25 PM  Result Value Ref Range Status   Specimen Description BLOOD RIGHT ARM  2 ML IN YELLOW BOTTLE  Final   Special Requests NONE  Final   Culture   Final    NO GROWTH 2 DAYS Performed at [facility]    Report Status PENDING  Incomplete  Urine culture     Status: None   Collection Time: 05/02/15  8:20 PM  Result Value Ref Range Status   Specimen Description URINE, CLEAN CATCH  Final   Special Requests NONE  Final   Culture   Final  MULTIPLE SPECIES PRESENT, SUGGEST RECOLLECTION Performed at [facility]    Report Status 05/04/2015 FINAL  Final    Medical History: Past Medical History  Diagnosis Date  . GERD (gastroesophageal reflux disease)   . Glaucoma   . H/O renal calculi 2002 & 2006  . H/O hiatal hernia   . Headache(784.0)     migraine  . Pneumonia     dx 10-06-2014 per CXR--  on 10-27-2014 pt states finished antibiotic and denies cough or fever  . Hyperlipidemia   . History of MI (myocardial infarction)     10/ 2013 in setting of Septic Shock  . History of atrial fibrillation     10/ 2013  in setting of Septic Shock  . History of CHF (congestive heart failure)     10/ 2013 in setting of septic shock  . Sigmoid  diverticulosis   . Nephrolithiasis     bilateral  . CKD (chronic kidney disease), stage III     nephrologist-  dr Florene Glen  . Right ureteral stone   . History of acute respiratory failure     10/ 2013  -- ventilated in setting septic shock  . Dysrhythmia     Afib in 2013 when she had septic shock related to stone ureteral obstruction  . Myocardial infarction [facility])     Was reported in 2013 during hospitalization with septic shock  . Septic shock ([place]) 04/04/2012  . Sepsis ([place])   . Complication of anesthesia     use a little anesthesia , per patient MD states she quit breathing   . Hypertension     medication removed from regimen due to low blood pressure   . Peripheral vascular disease ([place])   . S/P hemodialysis catheter insertion ([place]) 04/11/2015     right anterior chest , only used once   . History of nephrostomy 04/11/2015     currently inplace 04/28/2015   . History of blood transfusion 04/13/2015     Medications:  Anti-infectives    Start     Dose/Rate Route Frequency Ordered Stop   05/05/15 2200  cefpodoxime (VANTIN) tablet 200 mg  Status:  Discontinued     200 mg Oral Every 12 hours 05/05/15 1747 05/05/15 1749   05/05/15 1400  piperacillin-tazobactam (ZOSYN) IVPB 2.25 g  Status:  Discontinued     2.25 g 100 mL/hr over 30 Minutes Intravenous 3 times per day 05/05/15 0749 05/05/15 1747   05/03/15 0000  piperacillin-tazobactam (ZOSYN) IVPB 2.25 g  Status:  Discontinued     2.25 g 100 mL/hr over 30 Minutes Intravenous 4 times per day 05/02/15 2227 05/05/15 0749   05/02/15 1800  vancomycin (VANCOCIN) 1,500 mg in sodium chloride 0.9 % 500 mL IVPB     1,500 mg 250 mL/hr over 120 Minutes Intravenous  Once 05/02/15 1757 05/02/15 2104   05/02/15 1800  piperacillin-tazobactam (ZOSYN) IVPB 3.375 g     3.375 g 100 mL/hr over 30 Minutes Intravenous  Once 05/02/15 1757 05/02/15 1903     Assessment: Patient admitted 11/12 with Sepsis due to pyelonephritis.  Zosyn initiated; patient  with very poor renal function.  S/P stent replacement on 11/14.  PM Update: Cultures remain negative to date; therefore MD has d/c'ed Zosyn and requested pharmacy to dose Levofloxacin orally x 5 days for treatment of UTI  Goal of Therapy:  Levofloxacin dosing based on renal function Appropriate antibiotic dosing for renal function; eradication of infection   Plan:  Zosyn d/c'ed Levofloxacin 250mg   po q48h x 5 days Follow renal function  Leone Haven, PharmD  05/05/2015, 6:12 PM  ADDENDUM:  Physician has d/c'ed orders for Levofloxacin and has requested pharmacy to restart dosing of Zosyn for treatment of sepsis with a UTI  Plan:  Restart Zosyn 2.25gm IV q8h  Leone Haven, PharmD 05/05/15 @ 18:41

## 2015-05-05 NOTE — Progress Notes (Addendum)
[facility] Progress Note    Patient: [name]    [id number]  DOB: 19-Apr-1946     DOA: 05/02/2015 Date of Service: the patient was seen and examined on 05/05/2015  Please see addendum   Subjective: Complains of some fatigue earlier in the morning. Continues to have some loose watery bowel movements. No abdominal pain.  Nutrition: Able to tolerate oral diet, npo last night Activity: Underwent procedure yesterday and has remained bedridden Last BM: 05/05/15  Brief Summary of Hospitalization: Day 3 of admission Patient presented last night with complaints of nausea vomiting and abdominal pain. CT of the abdomen was positive for bilateral pyelonephritis. Patient was started on broad-spectrum antibiotics and was admitted on telemetry. Urology was consulted and the patient underwent removal of the right double-J stent and placement of bilateral double-J stent on 05/04/2015. Nephrostomy tube was clamped as well.  Assessment and Plan 1. Sepsis [facility]) Patient admitted with sepsis. The pt meets SIRS criteria Tmax 39.6 C/ 103.2 F Tachycardia Leucocytosis Possibly secondary to pyelonephritis based on CT scan.  Patient tolerated of the stent exchange procedure well Patient is currently on Zosyn since the blood cultures remain negative patient will be switched to levofloxacin Continue with IV fluids. Adding probiotics  2.Nephrolithiasis Status post J stent placement as well as nephrostomy tube placement. Urology currently following the patient We will need to monitor her renal function on daily basis,  3  Essential hypertension, benign Initially was hypotensive, Blood pressure currently improving. Continue to hold her home medication.  4  Chronic kidney disease, stage IV (severe) (HCC) Serum creatinine remains stable, BUN is also improving with hydration. Continue her IV hydration. Need to monitor the renal function since the patient's nephrostomy tube is been clamped,  may need assistance from nephrology  5  Normocytic anemia Stable continue to monitor.  6  Chronic diastolic CHF (congestive heart failure) (HCC) Monitor for volume overload. Currently appear euvolemic and therefore continue IV hydration.  7. Hypokalemia Stabilized   Addendum: I was called by the nurse today with the patient at the bedside since the daughter had a concern that the patient is completely changed. Next and patient had 3 episodes of incontinence of urine today.  On examination the patient is alert awake and oriented 3. No acute complaint of chest pain. No shortness of breath. Bilateral lung sounds are clear to auscultation. S1 and S2 present. Abdomen sounds are present no tenderness. Neurological examination shows pupils are bilaterally equally and reactive, bilateral sensations are present to light touch, bilaterally equal strength. Blood pressure 120/45, temperature 90.9, heart rate 102, saturation 96% on room air Reassured the family that the patient is stable as per the examination. Patient's only complaint was incontinence of the urine which is most likely secondary to clamping of the nephrostomy tube, and reorganization of the urine flow through the bladder and the urethra. And hopefully will eventually get better. Initial plan was to narrow on the antibiotics but with the current changes antibiotic Zosyn will be continued   Shyan Scalisi 6:38 PM 05/05/2015   DVT Prophylaxis: subcutaneous Heparin on hold last night for procedure. Nutrition: npo for procedure, resume later.  Advance goals of care discussion: Full code  Disposition:  Discharge in 1-2 days pending stabilizing renal function  Consultants: Urology Dr. Alinda Money, Dr Gaynelle Arabian  Procedures: Bilateral double-J stent placement Right double-J stent exchange  Antibiotics: Zosyn 05/02/2015 start date,stop date 05/05/2015 Levofloxacin start date 05/05/2015   Intake/Output Summary (Last 24 hours) at  05/05/15 1748  Last data filed at 05/05/15 1200  Gross per 24 hour  Intake 1165.83 ml  Output    501 ml  Net 664.83 ml   Filed Weights   05/02/15 2245  Weight: 82.237 kg (181 lb 4.8 oz)    Objective: Physical Exam: Filed Vitals:   05/04/15 1330 05/04/15 2011 05/05/15 0331 05/05/15 1330  BP:  132/59 118/60 125/64  Pulse:  94 100 92  Temp:  98.7 F (37.1 C) 99.7 F (37.6 C) 98.1 F (36.7 C)  TempSrc:  Oral Oral Oral  Resp:  22 20 20   Height:      Weight:      SpO2: 100% 99% 95% 100%     General: Appear in mild distress ENT: Oral Mucosa moist. Cardiovascular: S1 and S2 Present, no Murmur, Respiratory: Bilateral Air entry no and Clear to Auscultation, no Crackles, no wheezes Abdomen: Bowel Sound present, Soft and no tenderness Extremities: no Pedal edema, no calf tenderness  Data Reviewed: CBC:  Recent Labs Lab 04/30/15 1046 05/02/15 1820 05/03/15 0603 05/04/15 0435 05/05/15 0502  WBC  --  13.8* 11.6* 7.4 9.6  NEUTROABS  --  12.0* 10.1* 5.6 8.2*  HGB 10.4* 11.2* 10.4* 9.6* 9.8*  HCT  --  36.0 32.4* 30.9* 31.4*  MCV  --  90.2 90.5 89.6 90.5  PLT  --  203 206 190 [id number]   Basic Metabolic Panel:  Recent Labs Lab 05/02/15 1820 05/03/15 0603 05/04/15 0435 05/05/15 0502  NA 135 137 135 136  K 4.0 3.8 3.4* 3.9  CL 104 108 107 109  CO2 17* 21* 20* 19*  GLUCOSE 108* 119* 100* 103*  BUN 62* 63* 50* 41*  CREATININE 4.78* 4.48* 4.41* 4.85*  CALCIUM 9.9 9.3 9.0 9.5   Liver Function Tests:  Recent Labs Lab 05/02/15 1820 05/04/15 0435  AST 15 11*  ALT 10* 11*  ALKPHOS 70 53  BILITOT 0.4 0.5  PROT 6.9 5.9*  ALBUMIN 3.4* 2.8*   BNP (last 3 results)  Recent Labs  02/26/15 0530  BNP 35.3   Studies: No results found.  Scheduled Meds: . acidophilus  2 capsule Oral Daily  . cefpodoxime  200 mg Oral Q12H  . famotidine  20 mg Oral Daily  . feeding supplement (NEPRO CARB STEADY)  237 mL Oral Q24H  . heparin subcutaneous  5,000 Units Subcutaneous 3  times per day  . latanoprost  1 drop Both Eyes QHS  . saccharomyces boulardii  250 mg Oral BID  . timolol  1 drop Both Eyes q morning - 10a   Continuous Infusions: . sodium chloride 1,000 mL (05/03/15 2152)    Time spent: 20 minutes.  Author: Berle Mull, MD [facility] Pager: [phone] 05/05/2015 5:48 PM  If 7PM-7AM, please contact night-coverage at [email or web address], password [address]

## 2015-05-06 ENCOUNTER — Inpatient Hospital Stay (HOSPITAL_COMMUNITY): Payer: Medicare Other

## 2015-05-06 ENCOUNTER — Other Ambulatory Visit: Payer: Self-pay | Admitting: Interventional Cardiology

## 2015-05-06 DIAGNOSIS — I5032 Chronic diastolic (congestive) heart failure: Secondary | ICD-10-CM

## 2015-05-06 DIAGNOSIS — N184 Chronic kidney disease, stage 4 (severe): Secondary | ICD-10-CM

## 2015-05-06 DIAGNOSIS — A419 Sepsis, unspecified organism: Principal | ICD-10-CM

## 2015-05-06 DIAGNOSIS — D649 Anemia, unspecified: Secondary | ICD-10-CM

## 2015-05-06 DIAGNOSIS — N200 Calculus of kidney: Secondary | ICD-10-CM

## 2015-05-06 DIAGNOSIS — I2510 Atherosclerotic heart disease of native coronary artery without angina pectoris: Secondary | ICD-10-CM

## 2015-05-06 DIAGNOSIS — I10 Essential (primary) hypertension: Secondary | ICD-10-CM

## 2015-05-06 DIAGNOSIS — N12 Tubulo-interstitial nephritis, not specified as acute or chronic: Secondary | ICD-10-CM

## 2015-05-06 LAB — COMPREHENSIVE METABOLIC PANEL
ALT: 13 U/L — ABNORMAL LOW (ref 14–54)
AST: 10 U/L — ABNORMAL LOW (ref 15–41)
Albumin: 2.6 g/dL — ABNORMAL LOW (ref 3.5–5.0)
Alkaline Phosphatase: 63 U/L (ref 38–126)
Anion gap: 9 (ref 5–15)
BUN: 39 mg/dL — ABNORMAL HIGH (ref 6–20)
CO2: 19 mmol/L — ABNORMAL LOW (ref 22–32)
Calcium: 9 mg/dL (ref 8.9–10.3)
Chloride: 106 mmol/L (ref 101–111)
Creatinine, Ser: 4.95 mg/dL — ABNORMAL HIGH (ref 0.44–1.00)
GFR calc Af Amer: 9 mL/min — ABNORMAL LOW (ref >60)
GFR calc non Af Amer: 8 mL/min — ABNORMAL LOW (ref >60)
Glucose, Bld: 106 mg/dL — ABNORMAL HIGH (ref 65–99)
Potassium: 3.7 mmol/L (ref 3.5–5.1)
Sodium: 134 mmol/L — ABNORMAL LOW (ref 135–145)
Total Bilirubin: 0.4 mg/dL (ref 0.3–1.2)
Total Protein: 6.1 g/dL — ABNORMAL LOW (ref 6.5–8.1)

## 2015-05-06 LAB — CBC WITH DIFFERENTIAL/PLATELET
Basophils Absolute: 0 10*3/uL (ref 0.0–0.1)
Basophils Relative: 1 %
Eosinophils Absolute: 0.1 10*3/uL (ref 0.0–0.7)
Eosinophils Relative: 1 %
HCT: 31.6 % — ABNORMAL LOW (ref 36.0–46.0)
Hemoglobin: 9.7 g/dL — ABNORMAL LOW (ref 12.0–15.0)
Lymphocytes Relative: 12 %
Lymphs Abs: 1 10*3/uL (ref 0.7–4.0)
MCH: 27.3 pg (ref 26.0–34.0)
MCHC: 30.7 g/dL (ref 30.0–36.0)
MCV: 89 fL (ref 78.0–100.0)
Monocytes Absolute: 0.7 10*3/uL (ref 0.1–1.0)
Monocytes Relative: 8 %
Neutro Abs: 6.6 10*3/uL (ref 1.7–7.7)
Neutrophils Relative %: 78 %
Platelets: 200 10*3/uL (ref 150–400)
RBC: 3.55 MIL/uL — ABNORMAL LOW (ref 3.87–5.11)
RDW: 17.4 % — ABNORMAL HIGH (ref 11.5–15.5)
WBC: 8.5 10*3/uL (ref 4.0–10.5)

## 2015-05-06 MED ORDER — METOPROLOL TARTRATE 25 MG PO TABS: 12.5000 mg | ORAL_TABLET | Freq: Two times a day (BID) | ORAL | Status: DC

## 2015-05-06 MED ORDER — SODIUM CHLORIDE 0.9 % IV BOLUS (SEPSIS)
250.0000 mL | Freq: Once | INTRAVENOUS | Status: AC
  Administered 2015-05-06: 250 mL via INTRAVENOUS

## 2015-05-06 NOTE — Progress Notes (Addendum)
[facility]                                                                              Patient Demographics  [name], is a 69 y.o. female, DOB - 1945-07-03, XT:[id number]  Admit date - 05/02/2015   Admitting Physician Gennaro Africa, MD  Outpatient Primary MD for the patient is [name], MD  LOS - 4   Chief Complaint  Patient presents with  . Chills      HPI on 05/02/2015 by Dr. Gennaro Africa - Patient is a 69 yo female with history of CKD IV s/p placement of cath for HD ( had only one session), nephrolithiasis s/p R.nephrostomy in Sep ( due to replace next week), recent admission for pyelonephritis who was brought with cc of chills that started today and was found to have a fever in the ER.  - She has been having shortness of breath but no cough, chest pain or URI symptoms. She denies N/V/abdominal pain. She denies changes in urine when emptying nephrostomy bag. No hematuria. No other complaints.   Assessment & Plan   Sepsis secondary to pyelonephritis -Upon admission, patient was febrile with tachycardia and leukocytosis -CT abdomen/pelvis: Mild stranding about both kidneys, very mild left hydronephrosis, findings raise possibility of pyelonephritis and/or ureteritis -Patient placed on Zosyn -Spoke with urology, Dr. Gaynelle Arabian, continue current workup, would consider discharging patient with low dose antibiotic given her history of C. difficile colitis -Blood culture negative to date -Urine culture: Multiple species present  Nephrolithiasis -Status post J stent placement as well as nephrostomy tube placement -Nephrostomy tube to be removed today by IR -Urology consulted and appreciated  Essential hypertension -Patient was initially hypotensive, blood pressure medications held  Chronic kidney disease stage IV-V -Serum creatinine remained stable, BUN improving -Continue to monitor BMP -Spoke with Dr. Florene Glen, patient may be at her new baseline Cr.   Will obtain UE venous mapping and ask vascular surgery to follow-up for access placement  Normocytic anemia/anemia of chronic disease -Hemoglobin currently stable, continue to monitor CBC  Chronic diastolic heart failure -Currently compensated -Continue to monitor I/O, daily weights  Hypokalemia -Resolved, continue to monitor BMP  Code Status: Full  Family Communication: Daughters at bedside  Disposition Plan: Admitted, expect discharge within 24-48 hours  Time Spent in minutes   30 minutes  Procedures  B/L double J-stent placement Right double J-stent exchange Removal of nephrostomy tube  Consults   Urology IR Nephrology, Dr. Florene Glen, via phone Vascular surgery, via phone  DVT Prophylaxis  Heparin  Lab Results  Component Value Date   PLT 200 05/06/2015    Medications  Scheduled Meds: . acidophilus  2 capsule Oral Daily  . famotidine  20 mg Oral Daily  . feeding supplement (NEPRO CARB STEADY)  237 mL Oral Q24H  . heparin subcutaneous  5,000 Units Subcutaneous 3 times per day  . latanoprost  1 drop Both Eyes QHS  . piperacillin-tazobactam (ZOSYN)  IV  2.25 g Intravenous 3 times per day  . saccharomyces boulardii  250 mg Oral BID  . timolol  1 drop Both Eyes q morning - 10a   Continuous Infusions: . sodium chloride 100 mL/hr at  05/06/15 0100   PRN Meds:.acetaminophen, alum & mag hydroxide-simeth, HYDROcodone-acetaminophen, morphine injection, sodium chloride  Antibiotics    Anti-infectives    Start     Dose/Rate Route Frequency Ordered Stop   05/05/15 2200  cefpodoxime (VANTIN) tablet 200 mg  Status:  Discontinued     200 mg Oral Every 12 hours 05/05/15 1747 05/05/15 1749   05/05/15 2200  levofloxacin (LEVAQUIN) tablet 250 mg  Status:  Discontinued     250 mg Oral Every 48 hours 05/05/15 1819 05/05/15 1829   05/05/15 2200  piperacillin-tazobactam (ZOSYN) IVPB 2.25 g     2.25 g 100 mL/hr over 30 Minutes Intravenous 3 times per day 05/05/15 1839      05/05/15 1400  piperacillin-tazobactam (ZOSYN) IVPB 2.25 g  Status:  Discontinued     2.25 g 100 mL/hr over 30 Minutes Intravenous 3 times per day 05/05/15 0749 05/05/15 1747   05/03/15 0000  piperacillin-tazobactam (ZOSYN) IVPB 2.25 g  Status:  Discontinued     2.25 g 100 mL/hr over 30 Minutes Intravenous 4 times per day 05/02/15 2227 05/05/15 0749   05/02/15 1800  vancomycin (VANCOCIN) 1,500 mg in sodium chloride 0.9 % 500 mL IVPB     1,500 mg 250 mL/hr over 120 Minutes Intravenous  Once 05/02/15 1757 05/02/15 2104   05/02/15 1800  piperacillin-tazobactam (ZOSYN) IVPB 3.375 g     3.375 g 100 mL/hr over 30 Minutes Intravenous  Once 05/02/15 1757 05/02/15 1903      Subjective:   Eldena Savacool seen and examined today.  Patient states she is feeling much better than she did yesterday. Per family at bedside, patient has improved but is not back to her baseline. Patient denies any chest pain, shortness of breath, abdominal pain, nausea or vomiting.  Objective:   Filed Vitals:   05/05/15 1830 05/05/15 2021 05/06/15 0011 05/06/15 0535  BP: 122/58 148/66 125/59 118/65  Pulse: 102 99 98 91  Temp: 99.6 F (37.6 C) 100.4 F (38 C) 100.5 F (38.1 C) 98.6 F (37 C)  TempSrc: Oral Oral Oral Oral  Resp: 18 18 17 19   Height:      Weight:    81 kg (178 lb 9.2 oz)  SpO2: 96% 97% 96% 96%    Wt Readings from Last 3 Encounters:  05/06/15 81 kg (178 lb 9.2 oz)  04/30/15 80.287 kg (177 lb)  04/28/15 81.194 kg (179 lb)     Intake/Output Summary (Last 24 hours) at 05/06/15 1154 Last data filed at 05/06/15 [id number]  Gross per 24 hour  Intake   1900 ml  Output   1175 ml  Net    725 ml    Exam  General: Well developed, well nourished, NAD, appears stated age  62: NCAT, mucous membranes moist.   Cardiovascular: S1 S2 auscultated, no rubs, murmurs or gallops. Regular rate and rhythm.  Respiratory: Clear to auscultation bilaterally with equal chest rise  Abdomen: Soft, nontender,  nondistended, + bowel sounds  Extremities: warm dry without cyanosis clubbing or edema  Neuro: AAOx3, nonfocal  Psych: Normal affect and demeanor with intact judgement and insight  Data Review   Micro Results Recent Results (from the past 240 hour(s))  Culture, blood (routine x 2)     Status: None (Preliminary result)   Collection Time: 05/02/15  6:18 PM  Result Value Ref Range Status   Specimen Description BLOOD LEFT FOREARM  7 ML IN YELLOW BOTTLE  Final   Special Requests NONE  Final  Culture   Final    NO GROWTH 2 DAYS Performed at [facility]    Report Status PENDING  Incomplete  Culture, blood (routine x 2)     Status: None (Preliminary result)   Collection Time: 05/02/15  6:25 PM  Result Value Ref Range Status   Specimen Description BLOOD RIGHT ARM  2 ML IN YELLOW BOTTLE  Final   Special Requests NONE  Final   Culture   Final    NO GROWTH 2 DAYS Performed at [facility]    Report Status PENDING  Incomplete  Urine culture     Status: None   Collection Time: 05/02/15  8:20 PM  Result Value Ref Range Status   Specimen Description URINE, CLEAN CATCH  Final   Special Requests NONE  Final   Culture   Final    MULTIPLE SPECIES PRESENT, SUGGEST RECOLLECTION Performed at [facility]    Report Status 05/04/2015 FINAL  Final    Radiology Reports Ct Abdomen Pelvis Wo Contrast  05/02/2015  CLINICAL DATA:  Shaking chills and slurring words today. Right nephrostomy tube in place. EXAM: CT ABDOMEN AND PELVIS WITHOUT CONTRAST TECHNIQUE: Multidetector CT imaging of the abdomen and pelvis was performed following the standard protocol without IV contrast. COMPARISON:  CT abdomen and pelvis 02/20/2015 and 04/06/2015. FINDINGS: Hiatal hernia is identified. No pleural or pericardial effusion. There is some atelectasis or scar in the left lung base. Right nephrostomy tube and right ureteral stent are in place. The tubes are well-positioned. There is no right  hydronephrosis. A tiny amount of air is seen in the right intrarenal collecting system likely related to the nephrostomy tube. Atrophy of the left kidney is again seen and there is mild fullness of the left intrarenal collecting system. There is some stranding about both kidneys. The walls left ureter appear somewhat thickened. Urinary bladder is decompressed scratch the urinary bladder is nearly completely decompressed. The gallbladder is been removed. The liver, spleen, adrenal glands and pancreas are unremarkable. The patient is status post hysterectomy. Fat containing ventral hernias are again seen and unchanged. The largest hernias in the pelvis. Diverticulosis appears worst in the sigmoid but there is no evidence of diverticulitis. The small bowel and appendix are unremarkable. No fluid collection or lymphadenopathy is seen. Lumbar spondylosis appears worst at L2-3. No lytic or sclerotic bony lesion is identified. IMPRESSION: A nephrostomy tube in the right kidney is new since the comparison examinations. The right intrarenal collecting system is decompressed. Tiny amount of gas in an upper pole calyx is likely related to the nephrostomy tube. Right double-J ureteral stent remains in place in good position. Mild stranding about both kidneys. Very mild left hydronephrosis and of the walls of the left ureter is unchanged and may be due to infection or inflammation. Findings raise the possibility of pyelonephritis and/or ureteritis. Diverticulosis without diverticulitis. Hiatal hernia and fat containing ventral hernias, largest in the pelvis, are unchanged. Electronically Signed   By: Inge Rise M.D.   On: 05/02/2015 20:12   Dg Chest 2 View  05/02/2015  CLINICAL DATA:  Acute onset of fever and shortness of breath. Initial encounter. EXAM: CHEST  2 VIEW COMPARISON:  Chest radiograph performed 02/25/2015 FINDINGS: The lungs are well-aerated and clear. There is no evidence of focal opacification, pleural  effusion or pneumothorax. The heart is normal in size; the mediastinal contour is within normal limits. No acute osseous abnormalities are seen. A large hiatal hernia is noted, partially filled  with air. A right-sided dual-lumen catheter is noted ending about the mid SVC. IMPRESSION: 1. No acute cardiopulmonary process are seen. 2. Large hiatal hernia noted. Electronically Signed   By: Garald Balding M.D.   On: 05/02/2015 18:20   Nm Renal Imaging Flow W/pharm  04/08/2015  CLINICAL DATA:  Acute kidney disease. History of left renal atrophy and chronic right hydronephrosis. EXAM: NUCLEAR MEDICINE RENAL SCAN WITH DIURETIC ADMINISTRATION TECHNIQUE: Radionuclide angiographic and sequential renal images were obtained after intravenous injection of radiopharmaceutical. Imaging was continued during slow intravenous injection of Lasix approximately 15 minutes after the start of the examination. RADIOPHARMACEUTICALS:  13.9 mCi Technetium-10m MAG3 IV COMPARISON:  None. FINDINGS: Flow: Decreased perfusion to the smaller left kidney. Normal perfusion to the right kidney. Left renogram: Diminished cortical uptake by the small left kidney. There is delayed excretion of the radiopharmaceutical. Right renogram: Normal cortical uptake. There is diminished clearance of the radiopharmaceutical from the dilated right renal collecting system. Differential: Left kidney = 28 % Right kidney = 72 % T1/2 post Lasix : Left kidney = not achieved min Right kidney = not achieved. Min IMPRESSION: 1. Diminished left renal function. There is delayed perfusion, cortical uptake and excretion of the radiopharmaceutical by the small left kidney. 2. Delayed and diminished clearance of the radiopharmaceutical from the dilated right renal collecting system compatible with partial obstruction. 3. Split renal function is equal to 72% from the right kidney and 28% from the left kidney. Electronically Signed   By: Kerby Moors M.D.   On: 04/08/2015 16:34    US Renal  04/11/2015  CLINICAL DATA:  Hydronephrosis, poorly functioning right ureteral stent, currently on dialysis, elevated creatinine EXAM: RENAL / URINARY TRACT ULTRASOUND COMPLETE COMPARISON:  04/06/2015 CT FINDINGS: Right Kidney: Length: 11.5 cm. Normal echogenicity and cortical thickness. Ureteral stent extends into the upper pole by ultrasound. Moderate hydronephrosis evident. No focal abnormality. Left Kidney: Length: 9.5 cm. Mild cortical thinning but normal echogenicity. Mild left hydronephrosis and proximal hydroureter. Bladder: Appears normal for degree of bladder distention. IMPRESSION: Right greater than left hydronephrosis despite right ureteral stent insertion Electronically Signed   By: Jerilynn Mages.  Shick M.D.   On: 04/11/2015 13:53   Ir Fluoro Guide Cv Line Right  04/10/2015  CLINICAL DATA:  End-stage renal disease, hypertension, no current access to begin dialysis. EXAM: ULTRASOUND GUIDANCE FOR VASCULAR ACCESS RIGHT INTERNAL JUGULAR PERMANENT HEMODIALYSIS CATHETER Date:  10/21/201610/21/2016 10:12 am Radiologist:  M. Daryll Brod, MD Guidance:  Ultrasound and fluoroscopic FLUOROSCOPY TIME:  30 seconds, 10.7 mGy MEDICATIONS AND MEDICAL HISTORY: 2 g Ancef administered within 1 hour of the procedure, 1 mg Versed, 50 mcg fentanyl ANESTHESIA/SEDATION: 15 minutes CONTRAST:  None. COMPLICATIONS: None immediate PROCEDURE: Informed consent was obtained from the patient following explanation of the procedure, risks, benefits and alternatives. The patient understands, agrees and consents for the procedure. All questions were addressed. A time out was performed. Maximal barrier sterile technique utilized including caps, mask, sterile gowns, sterile gloves, large sterile drape, hand hygiene, and 2% chlorhexidine scrub. Under sterile conditions and local anesthesia, right internal jugular micropuncture venous access was performed with ultrasound. Images were obtained for documentation. A guide wire was  inserted followed by a transitional dilator. Next, a 0.035 guidewire was advanced into the IVC with a 5-French catheter. Measurements were obtained from the right venotomy site to the proximal right atrium. In the right infraclavicular chest, a subcutaneous tunnel was created under sterile conditions and local anesthesia. 1% lidocaine with epinephrine was utilized for  this. The 19 cm tip to cuff palindrome catheter was tunneled subcutaneously to the venotomy site and inserted into the SVC/RA junction through a valved peel-away sheath. Position was confirmed with fluoroscopy. Images were obtained for documentation. Blood was aspirated from the catheter followed by saline and heparin flushes. The appropriate volume and strength of heparin was instilled in each lumen. Caps were applied. The catheter was secured at the tunnel site with Gelfoam and a pursestring suture. The venotomy site was closed with subcuticular Vicryl suture. Dermabond was applied to the small right neck incision. A dry sterile dressing was applied. The catheter is ready for use. No immediate complications. IMPRESSION: Ultrasound and fluoroscopically guided right internal jugular tunneled hemodialysis catheter (Palindrome catheter). Electronically Signed   By: Jerilynn Mages.  Shick M.D.   On: 04/10/2015 11:03   Ir US Guide Vasc Access Right  04/10/2015  CLINICAL DATA:  End-stage renal disease, hypertension, no current access to begin dialysis. EXAM: ULTRASOUND GUIDANCE FOR VASCULAR ACCESS RIGHT INTERNAL JUGULAR PERMANENT HEMODIALYSIS CATHETER Date:  10/21/201610/21/2016 10:12 am Radiologist:  M. Daryll Brod, MD Guidance:  Ultrasound and fluoroscopic FLUOROSCOPY TIME:  30 seconds, 10.7 mGy MEDICATIONS AND MEDICAL HISTORY: 2 g Ancef administered within 1 hour of the procedure, 1 mg Versed, 50 mcg fentanyl ANESTHESIA/SEDATION: 15 minutes CONTRAST:  None. COMPLICATIONS: None immediate PROCEDURE: Informed consent was obtained from the patient following  explanation of the procedure, risks, benefits and alternatives. The patient understands, agrees and consents for the procedure. All questions were addressed. A time out was performed. Maximal barrier sterile technique utilized including caps, mask, sterile gowns, sterile gloves, large sterile drape, hand hygiene, and 2% chlorhexidine scrub. Under sterile conditions and local anesthesia, right internal jugular micropuncture venous access was performed with ultrasound. Images were obtained for documentation. A guide wire was inserted followed by a transitional dilator. Next, a 0.035 guidewire was advanced into the IVC with a 5-French catheter. Measurements were obtained from the right venotomy site to the proximal right atrium. In the right infraclavicular chest, a subcutaneous tunnel was created under sterile conditions and local anesthesia. 1% lidocaine with epinephrine was utilized for this. The 19 cm tip to cuff palindrome catheter was tunneled subcutaneously to the venotomy site and inserted into the SVC/RA junction through a valved peel-away sheath. Position was confirmed with fluoroscopy. Images were obtained for documentation. Blood was aspirated from the catheter followed by saline and heparin flushes. The appropriate volume and strength of heparin was instilled in each lumen. Caps were applied. The catheter was secured at the tunnel site with Gelfoam and a pursestring suture. The venotomy site was closed with subcuticular Vicryl suture. Dermabond was applied to the small right neck incision. A dry sterile dressing was applied. The catheter is ready for use. No immediate complications. IMPRESSION: Ultrasound and fluoroscopically guided right internal jugular tunneled hemodialysis catheter (Palindrome catheter). Electronically Signed   By: Jerilynn Mages.  Shick M.D.   On: 04/10/2015 11:03   Ct Renal Stone Study  04/06/2015  CLINICAL DATA:  Acute onset of renal failure. Nausea. Recent placement of right ureteral stent  for obstructing ureteral stone. Initial encounter. EXAM: CT ABDOMEN AND PELVIS WITHOUT CONTRAST TECHNIQUE: Multidetector CT imaging of the abdomen and pelvis was performed following the standard protocol without IV contrast. COMPARISON:  CT of the abdomen and pelvis performed 02/20/2015, and renal ultrasound performed 02/27/2015 FINDINGS: The visualized lung bases are clear. A large hiatal hernia is noted. Mild calcification is noted at the mitral valve. The liver and spleen are unremarkable in  appearance. The patient is status post cholecystectomy, with clips noted at the gallbladder fossa. The pancreas and adrenal glands are unremarkable. There is mild right-sided and minimal left-sided hydronephrosis, with diffuse wall thickening at the renal calyces and pelves, extending along both ureters, and mild perinephric stranding. Findings are concerning for pyelonephritis and ureteritis. A right ureteral stent is noted in expected position. No obstructing ureteral stones are seen. There is wall thickening about the bladder, with associated soft tissue inflammation, reflecting underlying cystitis. No free fluid is identified. The small bowel is unremarkable in appearance. The stomach is within normal limits. No acute vascular abnormalities are seen. The appendix is normal in caliber, without evidence of appendicitis. Mild scattered diverticulosis is noted along the transverse, descending and proximal sigmoid colon, without evidence of diverticulitis. There is a relatively broad-based moderate anterior abdominal wall hernia extending into the pannus, containing only fat. A small periumbilical hernia is also noted, to the right of the umbilicus, containing only fat. The patient is status post hysterectomy. No suspicious adnexal masses are seen. No inguinal lymphadenopathy is seen. No acute osseous abnormalities are identified. There is grade 1 retrolisthesis of L2 on L3, with associated vacuum phenomenon and endplate  sclerotic change, and minimal grade 1 retrolisthesis of L3 on L4. IMPRESSION: 1. Mild right-sided and minimal left-sided hydronephrosis, with diffuse wall thickening at the renal calyces and pelves, extending along both ureters, and mild perinephric stranding. Findings concerning for bilateral pyelonephritis and ureteritis, somewhat more prominent than in September. Right ureteral stent noted in expected position. No obstructing ureteral stone seen. 2. Diffuse bladder wall thickening, with associated soft tissue inflammation, concerning for underlying cystitis. 3. Large hiatal hernia noted. 4. Mild scattered diverticulosis along the transverse, descending and proximal sigmoid colon, without evidence of diverticulitis. 5. Relatively broad-based moderate anterior abdominal wall hernia extending into the pannus, containing only fat. Small periumbilical hernia to the right of the umbilicus, also containing only fat. 6. Mild degenerative change at the mid lumbar spine. Electronically Signed   By: Garald Balding M.D.   On: 04/06/2015 22:21   Ir Nephrostomy Placement Right  04/11/2015  CLINICAL DATA:  PERSISTENT RIGHT HYDRONEPHROSIS DESPITE URETERAL STENT INSERTION. RENAL FAILURE, ELEVATED CREATININE. EXAM: ULTRASOUND FLUOROSCOPIC 10 FRENCH RIGHT NEPHROSTOMY INSERTION Date:  10/22/201610/22/2016 2:23 pm Radiologist:  M. Daryll Brod, MD Guidance:  Ultrasound and fluoroscopic FLUOROSCOPY TIME:  1 minutes 12 seconds, 60 mGy MEDICATIONS AND MEDICAL HISTORY: Patient is already receiving 1 g Rocephin every 24 hours. 1.5 mg Versed, 75 mcg fentanyl ANESTHESIA/SEDATION: 10 minutes CONTRAST:  21mL OMNIPAQUE IOHEXOL 300 MG/ML  SOLN COMPLICATIONS: None immediate PROCEDURE: Informed consent was obtained from the patient following explanation of the procedure, risks, benefits and alternatives. The patient understands, agrees and consents for the procedure. All questions were addressed. A time out was performed. Maximal barrier  sterile technique utilized including caps, mask, sterile gowns, sterile gloves, large sterile drape, hand hygiene, and ChloraPrep. Under sterile conditions and local anesthesia, ultrasound micropuncture needle access performed of a right mid to lower pole dilated calyx. Needle position confirmed with ultrasound. Images obtained for documentation. There was return of cloudy exudative urine. Guidewire advanced easily into the renal pelvis. Accustick dilator set advanced. Amplatz guidewire exchange performed. Tract dilatation performed to advance a 10 French nephrostomy with the retention loop formed in the renal pelvis. Contrast injection confirms position. Gravity drainage bag connected. Catheter secured with a Prolene suture and a sterile dressing. No immediate complication. Patient tolerated the procedure well. IMPRESSION: Successful ultrasound and  fluoroscopic 10 French right nephrostomy insertion. Electronically Signed   By: Jerilynn Mages.  Shick M.D.   On: 04/11/2015 14:33    CBC  Recent Labs Lab 05/02/15 1820 05/03/15 0603 05/04/15 0435 05/05/15 0502 05/06/15 0520  WBC 13.8* 11.6* 7.4 9.6 8.5  HGB 11.2* 10.4* 9.6* 9.8* 9.7*  HCT 36.0 32.4* 30.9* 31.4* 31.6*  PLT 203 206 190 218 200  MCV 90.2 90.5 89.6 90.5 89.0  MCH 28.1 29.1 27.8 28.2 27.3  MCHC 31.1 32.1 31.1 31.2 30.7  RDW 17.6* 17.7* 17.6* 17.7* 17.4*  LYMPHSABS 0.9 0.7 0.8 0.6* 1.0  MONOABS 0.9 0.7 0.7 0.6 0.7  EOSABS 0.1 0.1 0.3 0.2 0.1  BASOSABS 0.0 0.0 0.0 0.0 0.0    Chemistries   Recent Labs Lab 05/02/15 1820 05/03/15 0603 05/04/15 0435 05/05/15 0502 05/05/15 1923 05/06/15 0520  NA 135 137 135 136 135 134*  K 4.0 3.8 3.4* 3.9 3.9 3.7  CL 104 108 107 109 106 106  CO2 17* 21* 20* 19* 20* 19*  GLUCOSE 108* 119* 100* 103* 113* 106*  BUN 62* 63* 50* 41* 40* 39*  CREATININE 4.78* 4.48* 4.41* 4.85* 4.79* 4.95*  CALCIUM 9.9 9.3 9.0 9.5 9.3 9.0  AST 15  --  11*  --   --  10*  ALT 10*  --  11*  --   --  13*  ALKPHOS 70  --  53   --   --  63  BILITOT 0.4  --  0.5  --   --  0.4   ------------------------------------------------------------------------------------------------------------------ estimated creatinine clearance is 10.7 mL/min (by C-G formula based on Cr of 4.95). ------------------------------------------------------------------------------------------------------------------ No results for input(s): HGBA1C in the last 72 hours. ------------------------------------------------------------------------------------------------------------------ No results for input(s): CHOL, HDL, LDLCALC, TRIG, CHOLHDL, LDLDIRECT in the last 72 hours. ------------------------------------------------------------------------------------------------------------------ No results for input(s): TSH, T4TOTAL, T3FREE, THYROIDAB in the last 72 hours.  Invalid input(s): FREET3 ------------------------------------------------------------------------------------------------------------------ No results for input(s): VITAMINB12, FOLATE, FERRITIN, TIBC, IRON, RETICCTPCT in the last 72 hours.  Coagulation profile  Recent Labs Lab 05/04/15 0435  INR 1.24    No results for input(s): DDIMER in the last 72 hours.  Cardiac Enzymes No results for input(s): CKMB, TROPONINI, MYOGLOBIN in the last 168 hours.  Invalid input(s): CK ------------------------------------------------------------------------------------------------------------------ Invalid input(s): POCBNP    Aldridge Krzyzanowski D.O. on 05/06/2015 at 11:54 AM  Between 7am to 7pm - Pager - [phone]  After 7pm go to [email or web address] - password [email or web address]  And look for the night coverage person covering for me after hours  [facility] Office  [phone]

## 2015-05-06 NOTE — Care Management Note (Signed)
Case Management Note  Patient Details  Name: [name] MRN: [id number] Date of Birth: 1945/08/18  Subjective/Objective:    Active w/[facility] HHRN-Kristen rep aware & following for resumption orders.  R Nephrostomy tube removed.POD#2 L JJ stent/placement.elevated BUN/Creat.                Action/Plan:d/c plan home w/[facility].   Expected Discharge Date:                  Expected Discharge Plan:  [place]  In-House Referral:     Discharge planning Services  CM Consult  Post Acute Care Choice:  [place] (Active w/[facility]) Choice offered to:     DME Arranged:    DME Agency:     HH Arranged:    HH Agency:     Status of Service:  In process, will continue to follow  Medicare Important Message Given:  Yes Date Medicare IM Given:    Medicare IM give by:    Date Additional Medicare IM Given:    Additional Medicare Important Message give by:     If discussed at [place] of Stay Meetings, dates discussed:    Additional Comments:  Dessa Phi, RN 05/06/2015, 2:30 PM

## 2015-05-06 NOTE — Procedures (Signed)
Interventional Radiology Procedure Note  Procedure:  Removal of right PCN under fluoro, SP right ureteral stent.  No complication.  Complications: None Recommendations:  - routine dressing changes until drainage from stoma stops   Signed,  Dulcy Fanny. Earleen Newport, DO

## 2015-05-06 NOTE — Progress Notes (Signed)
Urology Progress Note  2 Days Post-Op   Subjective: post exchange of Right JJ stent and placememt of L JJ stent. Right perc tube capped. Cr is unchanged.       No acute urologic events overnight. Ambulation:   none Flatus:    yes Bowel movement yes  Pain: none  Objective:  Blood pressure 111/65, pulse 93, temperature 98.1 F (36.7 C), temperature source Oral, resp. rate 18, height 5\' 3"  (1.6 m), weight 81 kg (178 lb 9.2 oz), last menstrual period 01/19/1992, SpO2 98 %.  Physical Exam:  General:  No acute distress, awake   Genitourinary:   benign Foley none    I/O last 3 completed shifts: In: 2610 [P.O.:600; I.V.:1810; IV Piggyback:200] Out: 1201 [Urine:1200; Stool:1]  Recent Labs     05/05/15  0502  05/06/15  0520  HGB  9.8*  9.7*  WBC  9.6  8.5  PLT  218  200    Recent Labs     05/05/15  1923  05/06/15  0520  NA  135  134*  K  3.9  3.7  CL  106  106  CO2  20*  19*  BUN  40*  39*  CREATININE  4.79*  4.95*  CALCIUM  9.3  9.0  GFRNONAA  8*  8*  GFRAA  10*  9*     Recent Labs     05/04/15  0435  INR  1.24     Invalid input(s): ABG  Assessment/Plan: Creatitnine is unchanged. She is ready for perc. To be removed. Discussed with pt, family and Medicine.   P:L Remove Right perc tube. Pt is ready for d/c from Urology standpt.

## 2015-05-07 ENCOUNTER — Inpatient Hospital Stay (HOSPITAL_COMMUNITY): Admission: RE | Admit: 2015-05-07 | Payer: Medicare Other | Source: Ambulatory Visit

## 2015-05-07 ENCOUNTER — Telehealth: Payer: Self-pay | Admitting: Vascular Surgery

## 2015-05-07 LAB — BASIC METABOLIC PANEL
Anion gap: 8 (ref 5–15)
BUN: 39 mg/dL — ABNORMAL HIGH (ref 6–20)
CO2: 20 mmol/L — ABNORMAL LOW (ref 22–32)
Calcium: 9.1 mg/dL (ref 8.9–10.3)
Chloride: 109 mmol/L (ref 101–111)
Creatinine, Ser: 5.23 mg/dL — ABNORMAL HIGH (ref 0.44–1.00)
GFR calc Af Amer: 9 mL/min — ABNORMAL LOW (ref >60)
GFR calc non Af Amer: 8 mL/min — ABNORMAL LOW (ref >60)
Glucose, Bld: 105 mg/dL — ABNORMAL HIGH (ref 65–99)
Potassium: 3.9 mmol/L (ref 3.5–5.1)
Sodium: 137 mmol/L (ref 135–145)

## 2015-05-07 MED ORDER — AMOXICILLIN-POT CLAVULANATE 250-125 MG PO TABS: 1.0000 | ORAL_TABLET | Freq: Two times a day (BID) | ORAL | Status: DC

## 2015-05-07 MED ORDER — RISAQUAD PO CAPS: 2.0000 | ORAL_CAPSULE | Freq: Every day | ORAL | Status: DC

## 2015-05-07 MED ORDER — HEPARIN SODIUM (PORCINE) 1000 UNIT/ML IJ SOLN
3200.0000 [IU] | Freq: Once | INTRAMUSCULAR | Status: AC
  Administered 2015-05-07: 1000 [IU] via INTRAVENOUS
  Filled 2015-05-07: qty 3.2

## 2015-05-07 MED ORDER — SACCHAROMYCES BOULARDII 250 MG PO CAPS: 250.0000 mg | ORAL_CAPSULE | Freq: Two times a day (BID) | ORAL | Status: DC

## 2015-05-07 MED ORDER — NEPRO/CARBSTEADY PO LIQD: 237.0000 mL | ORAL | Status: DC

## 2015-05-07 NOTE — Discharge Summary (Signed)
Physician Discharge Summary  [name] [id number] DOB: 1946-04-12 DOA: 05/02/2015  PCP: [name], MD  Admit date: 05/02/2015 Discharge date: 05/07/2015  Time spent: 45 minutes  Recommendations for Outpatient Follow-up:  Patient will be discharged to home.  Patient will need to follow up with primary care provider within one week of discharge and have repeat BMP.  Patient will need to follow up with Dr. Florene Glen, nephrology, as well as vascular surgery.  Patient should continue medications as prescribed.  Patient should follow a Renal with fluid restriction 209ml/day diet.   Discharge Diagnoses:  Sepsis secondary to pyelonephritis Nephrolithiasis Essential hypertension Chronic disease, stage IV-V Chronic diastolic heart failure Normocytic anemia/anemia of chronic disease  Hypokalemia  Discharge Condition: Stable  Diet recommendation: Renal with fluid restriction 2025ml/day  Filed Weights   05/02/15 2245 05/06/15 0535 05/07/15 0620  Weight: 82.237 kg (181 lb 4.8 oz) 81 kg (178 lb 9.2 oz) 82.3 kg (181 lb 7 oz)    History of present illness:  on 05/02/2015 by Dr. Gennaro Africa - Patient is a 69 yo female with history of CKD IV s/p placement of cath for HD ( had only one session), nephrolithiasis s/p R.nephrostomy in Sep ( due to replace next week), recent admission for pyelonephritis who was brought with cc of chills that started today and was found to have a fever in the ER.  - She has been having shortness of breath but no cough, chest pain or URI symptoms. She denies N/V/abdominal pain. She denies changes in urine when emptying nephrostomy bag. No hematuria. No other complaints.   Hospital Course:  Sepsis secondary to pyelonephritis -Upon admission, patient was febrile with tachycardia and leukocytosis -CT abdomen/pelvis: Mild stranding about both kidneys, very mild left hydronephrosis, findings raise possibility of pyelonephritis and/or ureteritis -Patient  placed on Zosyn -Spoke with urology, Dr. Gaynelle Arabian, continue current workup, would consider discharging patient with low dose antibiotic given her history of C. difficile colitis -Will discharge patient with low dose Augmentin 250mg  BID for additional 5 days -Blood culture negative to date -Urine culture: Multiple species present  Nephrolithiasis -Status post J stent placement as well as nephrostomy tube placement -Nephrostomy tube to be removed today by IR -Urology consulted and appreciated  Essential hypertension -Patient was initially hypotensive, blood pressure medications held  Chronic kidney disease stage IV-V -Serum creatinine remained stable, BUN improving -Continue to monitor BMP -Spoke with Dr. Florene Glen, patient may be at her new baseline Cr.  -Patient had UE venous mapping in Oct 2016 -Spoke with vascular surgery, they will contact patient with appointment as an outpatient  Normocytic anemia/anemia of chronic disease -Hemoglobin currently stable, continue to monitor CBC  Chronic diastolic heart failure -Currently compensated  Hypokalemia -Resolved  Procedures  B/L double J-stent placement Right double J-stent exchange Removal of nephrostomy tube  Consults  Urology IR Nephrology, Dr. Florene Glen, via phone Vascular surgery, via phone  Discharge Exam: Filed Vitals:   05/07/15 0620  BP: 115/58  Pulse: 81  Temp: 98.5 F (36.9 C)  Resp: 18   Exam  General: Well developed, well nourished, NAD  HEENT: NCAT, mucous membranes moist.   Cardiovascular: S1 S2 auscultated, no murmurs  Respiratory: Clear to auscultation   Abdomen: Soft, nontender, nondistended, + bowel sounds  Extremities: warm dry without cyanosis clubbing or edema  Neuro: AAOx3, nonfocal  Psych: Normal affect and demeanor, pleasant  Discharge Instructions      Discharge Instructions    Discharge instructions    Complete by:  As directed   Patient will be discharged to home.   Patient will need to follow up with primary care provider within one week of discharge and have repeat BMP.  Patient will need to follow up with Dr. Florene Glen, nephrology, as well as vascular surgery.  Patient should continue medications as prescribed.  Patient should follow a Renal with fluid restriction 2047ml/day diet.            Medication List    STOP taking these medications        metoprolol tartrate 25 MG tablet  Commonly known as:  LOPRESSOR      TAKE these medications        acetaminophen 500 MG tablet  Commonly known as:  TYLENOL  Take 500 mg by mouth every 6 (six) hours as needed for moderate pain.     acidophilus Caps capsule  Take 2 capsules by mouth daily.     amoxicillin-clavulanate 250-125 MG tablet  Commonly known as:  AUGMENTIN  Take 1 tablet by mouth 2 (two) times daily.     bimatoprost 0.01 % Soln  Commonly known as:  LUMIGAN  Place 1 drop into both eyes at bedtime.     ENSURE PLUS Liqd  Take 237 mLs by mouth daily as needed (nutritional supplement).     feeding supplement (NEPRO CARB STEADY) Liqd  Take 237 mLs by mouth daily.     HYDROcodone-acetaminophen 5-325 MG tablet  Commonly known as:  NORCO/VICODIN  Take 1 tablet by mouth 4 (four) times daily as needed for moderate pain.     ranitidine 150 MG tablet  Commonly known as:  ZANTAC  Take 150 mg by mouth 2 (two) times daily as needed for heartburn.     saccharomyces boulardii 250 MG capsule  Commonly known as:  FLORASTOR  Take 1 capsule (250 mg total) by mouth 2 (two) times daily.     sodium chloride 0.65 % Soln nasal spray  Commonly known as:  OCEAN  Place 1 spray into both nostrils 3 (three) times daily as needed for congestion.     timolol 0.5 % ophthalmic solution  Commonly known as:  BETIMOL  Place 1 drop into both eyes every morning.       Allergies  Allergen Reactions  . Vicodin [Hydrocodone-Acetaminophen] Nausea And Vomiting  . Percocet [Oxycodone-Acetaminophen] Nausea And  Vomiting   Follow-up Information    Follow up with [name], MD. Schedule an appointment as soon as possible for a visit in 1 week.   Specialty:  Internal Medicine   Why:  hospital follow-up   Contact information:   [address] [place] [address] [phone]       Follow up with Estanislado Emms, MD. Schedule an appointment as soon as possible for a visit in 1 week.   Specialty:  Nephrology   Why:  Hospital follow-up, repeat BMP   Contact information:   [place] [address] [phone]       Follow up with [facility]. Schedule an appointment as soon as possible for a visit in 1 week.   Contact information:   [address] [place] [id number]       The results of significant diagnostics from this hospitalization (including imaging, microbiology, ancillary and laboratory) are listed below for reference.  Significant Diagnostic Studies: Ct Abdomen Pelvis Wo Contrast  05/02/2015  CLINICAL DATA:  Shaking chills and slurring words today. Right nephrostomy tube in place. EXAM: CT ABDOMEN AND PELVIS WITHOUT CONTRAST TECHNIQUE: Multidetector CT imaging of the abdomen and pelvis was performed following the standard protocol without IV contrast. COMPARISON:  CT abdomen and pelvis 02/20/2015 and 04/06/2015. FINDINGS: Hiatal hernia is identified. No pleural or pericardial effusion. There is some atelectasis or scar in the left lung base. Right nephrostomy tube and right ureteral stent are in place. The tubes are well-positioned. There is no right hydronephrosis. A tiny amount of air is seen in the right intrarenal collecting system likely related to the nephrostomy tube. Atrophy of the left kidney is again seen and there is mild fullness of the left intrarenal collecting system. There is some stranding about both kidneys. The walls left ureter appear somewhat thickened. Urinary  bladder is decompressed scratch the urinary bladder is nearly completely decompressed. The gallbladder is been removed. The liver, spleen, adrenal glands and pancreas are unremarkable. The patient is status post hysterectomy. Fat containing ventral hernias are again seen and unchanged. The largest hernias in the pelvis. Diverticulosis appears worst in the sigmoid but there is no evidence of diverticulitis. The small bowel and appendix are unremarkable. No fluid collection or lymphadenopathy is seen. Lumbar spondylosis appears worst at L2-3. No lytic or sclerotic bony lesion is identified. IMPRESSION: A nephrostomy tube in the right kidney is new since the comparison examinations. The right intrarenal collecting system is decompressed. Tiny amount of gas in an upper pole calyx is likely related to the nephrostomy tube. Right double-J ureteral stent remains in place in good position. Mild stranding about both kidneys. Very mild left hydronephrosis and of the walls of the left ureter is unchanged and may be due to infection or inflammation. Findings raise the possibility of pyelonephritis and/or ureteritis. Diverticulosis without diverticulitis. Hiatal hernia and fat containing ventral hernias, largest in the pelvis, are unchanged. Electronically Signed   By: Inge Rise M.D.   On: 05/02/2015 20:12   Dg Chest 2 View  05/02/2015  CLINICAL DATA:  Acute onset of fever and shortness of breath. Initial encounter. EXAM: CHEST  2 VIEW COMPARISON:  Chest radiograph performed 02/25/2015 FINDINGS: The lungs are well-aerated and clear. There is no evidence of focal opacification, pleural effusion or pneumothorax. The heart is normal in size; the mediastinal contour is within normal limits. No acute osseous abnormalities are seen. A large hiatal hernia is noted, partially filled with air. A right-sided dual-lumen catheter is noted ending about the mid SVC. IMPRESSION: 1. No acute cardiopulmonary process are seen. 2. Large  hiatal hernia noted. Electronically Signed   By: Garald Balding M.D.   On: 05/02/2015 18:20   Nm Renal Imaging Flow W/pharm  04/08/2015  CLINICAL DATA:  Acute kidney disease. History of left renal atrophy and chronic right hydronephrosis. EXAM: NUCLEAR MEDICINE RENAL SCAN WITH DIURETIC ADMINISTRATION TECHNIQUE: Radionuclide angiographic and sequential renal images were obtained after intravenous injection of radiopharmaceutical. Imaging was continued during slow intravenous injection of Lasix approximately 15 minutes after the start of the examination. RADIOPHARMACEUTICALS:  13.9 mCi Technetium-63m MAG3 IV COMPARISON:  None. FINDINGS: Flow: Decreased perfusion to the smaller left kidney. Normal perfusion to the right kidney. Left renogram: Diminished cortical uptake by the small left kidney. There is delayed excretion of the radiopharmaceutical. Right renogram: Normal cortical uptake. There is diminished clearance of the radiopharmaceutical from the dilated right renal collecting system. Differential: Left kidney =  28 % Right kidney = 72 % T1/2 post Lasix : Left kidney = not achieved min Right kidney = not achieved. Min IMPRESSION: 1. Diminished left renal function. There is delayed perfusion, cortical uptake and excretion of the radiopharmaceutical by the small left kidney. 2. Delayed and diminished clearance of the radiopharmaceutical from the dilated right renal collecting system compatible with partial obstruction. 3. Split renal function is equal to 72% from the right kidney and 28% from the left kidney. Electronically Signed   By: Kerby Moors M.D.   On: 04/08/2015 16:34   US Renal  04/11/2015  CLINICAL DATA:  Hydronephrosis, poorly functioning right ureteral stent, currently on dialysis, elevated creatinine EXAM: RENAL / URINARY TRACT ULTRASOUND COMPLETE COMPARISON:  04/06/2015 CT FINDINGS: Right Kidney: Length: 11.5 cm. Normal echogenicity and cortical thickness. Ureteral stent extends into the  upper pole by ultrasound. Moderate hydronephrosis evident. No focal abnormality. Left Kidney: Length: 9.5 cm. Mild cortical thinning but normal echogenicity. Mild left hydronephrosis and proximal hydroureter. Bladder: Appears normal for degree of bladder distention. IMPRESSION: Right greater than left hydronephrosis despite right ureteral stent insertion Electronically Signed   By: Jerilynn Mages.  Shick M.D.   On: 04/11/2015 13:53   Ir Fluoro Guide Cv Line Right  04/10/2015  CLINICAL DATA:  End-stage renal disease, hypertension, no current access to begin dialysis. EXAM: ULTRASOUND GUIDANCE FOR VASCULAR ACCESS RIGHT INTERNAL JUGULAR PERMANENT HEMODIALYSIS CATHETER Date:  10/21/201610/21/2016 10:12 am Radiologist:  M. Daryll Brod, MD Guidance:  Ultrasound and fluoroscopic FLUOROSCOPY TIME:  30 seconds, 10.7 mGy MEDICATIONS AND MEDICAL HISTORY: 2 g Ancef administered within 1 hour of the procedure, 1 mg Versed, 50 mcg fentanyl ANESTHESIA/SEDATION: 15 minutes CONTRAST:  None. COMPLICATIONS: None immediate PROCEDURE: Informed consent was obtained from the patient following explanation of the procedure, risks, benefits and alternatives. The patient understands, agrees and consents for the procedure. All questions were addressed. A time out was performed. Maximal barrier sterile technique utilized including caps, mask, sterile gowns, sterile gloves, large sterile drape, hand hygiene, and 2% chlorhexidine scrub. Under sterile conditions and local anesthesia, right internal jugular micropuncture venous access was performed with ultrasound. Images were obtained for documentation. A guide wire was inserted followed by a transitional dilator. Next, a 0.035 guidewire was advanced into the IVC with a 5-French catheter. Measurements were obtained from the right venotomy site to the proximal right atrium. In the right infraclavicular chest, a subcutaneous tunnel was created under sterile conditions and local anesthesia. 1% lidocaine with  epinephrine was utilized for this. The 19 cm tip to cuff palindrome catheter was tunneled subcutaneously to the venotomy site and inserted into the SVC/RA junction through a valved peel-away sheath. Position was confirmed with fluoroscopy. Images were obtained for documentation. Blood was aspirated from the catheter followed by saline and heparin flushes. The appropriate volume and strength of heparin was instilled in each lumen. Caps were applied. The catheter was secured at the tunnel site with Gelfoam and a pursestring suture. The venotomy site was closed with subcuticular Vicryl suture. Dermabond was applied to the small right neck incision. A dry sterile dressing was applied. The catheter is ready for use. No immediate complications. IMPRESSION: Ultrasound and fluoroscopically guided right internal jugular tunneled hemodialysis catheter (Palindrome catheter). Electronically Signed   By: Jerilynn Mages.  Shick M.D.   On: 04/10/2015 11:03   Ir Nephro Tube Remov/fl  05/06/2015  INDICATION: 68 year old female with a history of hydronephrosis. Percutaneous nephrostomy placed on the right 04/11/2015. The patient has since undergone ureteral stenting, and  the percutaneous nephrostomy may be removed at this time. This will be performed under fluoroscopy to assure that the stents does not get displaced. EXAM: REMOVAL OF PERCUTANEOUS NEPHROSTOMY MEDICATIONS: None ANESTHESIA/SEDATION: None CONTRAST:  None COMPLICATIONS: None PROCEDURE: Informed consent was obtained from the patient following an explanation of the procedure, risks, benefits and alternatives. The patient understands, agrees and consents for the procedure. All questions were addressed. A time out was performed prior to the initiation of the procedure. The patient was positioned prone and non-contrast localization CT was performed of the pelvis to demonstrate the iliac marrow spaces. The operative site was prepped and draped in the usual sterile fashion. Under  fluoroscopic observation, the right-sided percutaneous nephrostomy tube was removed after ligation of the tube. The indwelling right-sided ureteral stent is unchanged in position. Patient tolerated the procedure well and remained hemodynamically stable throughout. No complications encountered. IMPRESSION: Status post fluoroscopic observation of right percutaneous nephrostomy removal. Unchanged position of right ureteral stent. Signed, Dulcy Fanny. Earleen Newport, DO [facility] Electronically Signed   By: Corrie Mckusick D.O.   On: 05/06/2015 12:30   Ir US Guide Vasc Access Right  04/10/2015  CLINICAL DATA:  End-stage renal disease, hypertension, no current access to begin dialysis. EXAM: ULTRASOUND GUIDANCE FOR VASCULAR ACCESS RIGHT INTERNAL JUGULAR PERMANENT HEMODIALYSIS CATHETER Date:  10/21/201610/21/2016 10:12 am Radiologist:  M. Daryll Brod, MD Guidance:  Ultrasound and fluoroscopic FLUOROSCOPY TIME:  30 seconds, 10.7 mGy MEDICATIONS AND MEDICAL HISTORY: 2 g Ancef administered within 1 hour of the procedure, 1 mg Versed, 50 mcg fentanyl ANESTHESIA/SEDATION: 15 minutes CONTRAST:  None. COMPLICATIONS: None immediate PROCEDURE: Informed consent was obtained from the patient following explanation of the procedure, risks, benefits and alternatives. The patient understands, agrees and consents for the procedure. All questions were addressed. A time out was performed. Maximal barrier sterile technique utilized including caps, mask, sterile gowns, sterile gloves, large sterile drape, hand hygiene, and 2% chlorhexidine scrub. Under sterile conditions and local anesthesia, right internal jugular micropuncture venous access was performed with ultrasound. Images were obtained for documentation. A guide wire was inserted followed by a transitional dilator. Next, a 0.035 guidewire was advanced into the IVC with a 5-French catheter. Measurements were obtained from the right  venotomy site to the proximal right atrium. In the right infraclavicular chest, a subcutaneous tunnel was created under sterile conditions and local anesthesia. 1% lidocaine with epinephrine was utilized for this. The 19 cm tip to cuff palindrome catheter was tunneled subcutaneously to the venotomy site and inserted into the SVC/RA junction through a valved peel-away sheath. Position was confirmed with fluoroscopy. Images were obtained for documentation. Blood was aspirated from the catheter followed by saline and heparin flushes. The appropriate volume and strength of heparin was instilled in each lumen. Caps were applied. The catheter was secured at the tunnel site with Gelfoam and a pursestring suture. The venotomy site was closed with subcuticular Vicryl suture. Dermabond was applied to the small right neck incision. A dry sterile dressing was applied. The catheter is ready for use. No immediate complications. IMPRESSION: Ultrasound and fluoroscopically guided right internal jugular tunneled hemodialysis catheter (Palindrome catheter). Electronically Signed   By: Jerilynn Mages.  Shick M.D.   On: 04/10/2015 11:03   Ir Nephrostomy Placement Right  04/11/2015  CLINICAL DATA:  PERSISTENT RIGHT HYDRONEPHROSIS DESPITE URETERAL STENT INSERTION. RENAL FAILURE, ELEVATED CREATININE. EXAM: ULTRASOUND FLUOROSCOPIC 10 FRENCH RIGHT NEPHROSTOMY INSERTION Date:  10/22/201610/22/2016 2:23 pm Radiologist:  M. Daryll Brod, MD Guidance:  Ultrasound and fluoroscopic FLUOROSCOPY TIME:  1 minutes 12 seconds, 60 mGy MEDICATIONS AND MEDICAL HISTORY: Patient is already receiving 1 g Rocephin every 24 hours. 1.5 mg Versed, 75 mcg fentanyl ANESTHESIA/SEDATION: 10 minutes CONTRAST:  55mL OMNIPAQUE IOHEXOL 300 MG/ML  SOLN COMPLICATIONS: None immediate PROCEDURE: Informed consent was obtained from the patient following explanation of the procedure, risks, benefits and alternatives. The patient understands, agrees and consents for the procedure. All  questions were addressed. A time out was performed. Maximal barrier sterile technique utilized including caps, mask, sterile gowns, sterile gloves, large sterile drape, hand hygiene, and ChloraPrep. Under sterile conditions and local anesthesia, ultrasound micropuncture needle access performed of a right mid to lower pole dilated calyx. Needle position confirmed with ultrasound. Images obtained for documentation. There was return of cloudy exudative urine. Guidewire advanced easily into the renal pelvis. Accustick dilator set advanced. Amplatz guidewire exchange performed. Tract dilatation performed to advance a 10 French nephrostomy with the retention loop formed in the renal pelvis. Contrast injection confirms position. Gravity drainage bag connected. Catheter secured with a Prolene suture and a sterile dressing. No immediate complication. Patient tolerated the procedure well. IMPRESSION: Successful ultrasound and fluoroscopic 10 French right nephrostomy insertion. Electronically Signed   By: Jerilynn Mages.  Shick M.D.   On: 04/11/2015 14:33    Microbiology: Recent Results (from the past 240 hour(s))  Culture, blood (routine x 2)     Status: None (Preliminary result)   Collection Time: 05/02/15  6:18 PM  Result Value Ref Range Status   Specimen Description BLOOD LEFT FOREARM  7 ML IN YELLOW BOTTLE  Final   Special Requests NONE  Final   Culture   Final    NO GROWTH 3 DAYS Performed at [facility]    Report Status PENDING  Incomplete  Culture, blood (routine x 2)     Status: None (Preliminary result)   Collection Time: 05/02/15  6:25 PM  Result Value Ref Range Status   Specimen Description BLOOD RIGHT ARM  2 ML IN YELLOW BOTTLE  Final   Special Requests NONE  Final   Culture   Final    NO GROWTH 3 DAYS Performed at [facility]    Report Status PENDING  Incomplete  Urine culture     Status: None   Collection Time: 05/02/15  8:20 PM  Result Value Ref Range Status   Specimen Description  URINE, CLEAN CATCH  Final   Special Requests NONE  Final   Culture   Final    MULTIPLE SPECIES PRESENT, SUGGEST RECOLLECTION Performed at [facility]    Report Status 05/04/2015 FINAL  Final     Labs: Basic Metabolic Panel:  Recent Labs Lab 05/04/15 0435 05/05/15 0502 05/05/15 1923 05/06/15 0520 05/07/15 0529  NA 135 136 135 134* 137  K 3.4* 3.9 3.9 3.7 3.9  CL 107 109 106 106 109  CO2 20* 19* 20* 19* 20*  GLUCOSE 100* 103* 113* 106* 105*  BUN 50* 41* 40* 39* 39*  CREATININE 4.41* 4.85* 4.79* 4.95* 5.23*  CALCIUM 9.0 9.5 9.3 9.0 9.1   Liver Function Tests:  Recent Labs Lab 05/02/15 1820 05/04/15 0435 05/06/15 0520  AST 15 11* 10*  ALT 10* 11* 13*  ALKPHOS 70 53 63  BILITOT 0.4 0.5 0.4  PROT 6.9 5.9* 6.1*  ALBUMIN 3.4* 2.8* 2.6*   No results for input(s): LIPASE, AMYLASE in the last 168 hours. No results for input(s): AMMONIA in the last 168 hours. CBC:  Recent Labs Lab 05/02/15 1820 05/03/15 0603 05/04/15 0435 05/05/15 0502 05/06/15 0520  WBC 13.8* 11.6* 7.4 9.6 8.5  NEUTROABS 12.0* 10.1* 5.6 8.2* 6.6  HGB 11.2* 10.4* 9.6* 9.8* 9.7*  HCT 36.0 32.4* 30.9* 31.4* 31.6*  MCV 90.2 90.5 89.6 90.5 89.0  PLT 203 206 190 218 200   Cardiac Enzymes: No results for input(s): CKTOTAL, CKMB, CKMBINDEX, TROPONINI in the last 168 hours. BNP: BNP (last 3 results)  Recent Labs  02/26/15 0530  BNP 35.3    ProBNP (last 3 results) No results for input(s): PROBNP in the last 8760 hours.  CBG: No results for input(s): GLUCAP in the last 168 hours.     SignedCristal Ford  [facility] 05/07/2015, 10:15 AM

## 2015-05-07 NOTE — Telephone Encounter (Signed)
-----   Message from Denman George, RN sent at 05/07/2015 10:23 AM EST ----- Regarding: FYI rec'd another call from Dr. Ree Kida; stated the pt. will d/c today from [facility].  She wanted to f/u on getting her sched. as OP for eval. for permanent access.  Vein mapping is done.  I told her we were working on it.

## 2015-05-07 NOTE — Care Management Note (Signed)
Case Management Note  Patient Details  Name: [name] MRN: [id number] Date of Birth: 11-21-1945  Subjective/Objective:     Sepsis secondary to pyelonephritis               Action/Plan: Discharge planning, [facility] arranged  Expected Discharge Date:                  Expected Discharge Plan:  [place]  In-House Referral:  NA  Discharge planning Services  CM Consult  Post Acute Care Choice:  Home Health (Active w/[facility]) Choice offered to:  Patient  DME Arranged:  N/A DME Agency:  NA  HH Arranged:  RN, Disease Management [place] Agency:  [place]  Status of Service:  Completed, signed off  Medicare Important Message Given:  Yes Date Medicare IM Given:    Medicare IM give by:    Date Additional Medicare IM Given:    Additional Medicare Important Message give by:     If discussed at [place] of Stay Meetings, dates discussed:    Additional Comments:  Guadalupe Maple, RN 05/07/2015, 10:55 AM

## 2015-05-07 NOTE — Progress Notes (Signed)
Pt sent home with daughter via private vehicle. AVS given and all questions answered. VSS. IV discontinued and site is CDI.

## 2015-05-07 NOTE — Telephone Encounter (Signed)
I have left a message for patient regarding an appointment with Dr Bridgett Larsson, Vascular Surgery on 05/20/15.  The patient is still admitted, so I have routed this to the referring MD- Dr Ree Kida as well.

## 2015-05-07 NOTE — Discharge Instructions (Signed)
Chronic Kidney Disease °Chronic kidney disease occurs when the kidneys are damaged over a long period. The kidneys are two organs that lie on either side of the spine between the middle of the back and the front of the abdomen. The kidneys: °· Remove wastes and extra water from the blood. °· Produce important hormones. These help keep bones strong, regulate blood pressure, and help create red blood cells. °· Balance the fluids and chemicals in the blood and tissues. °A small amount of kidney damage may not cause problems, but a large amount of damage may make it difficult or impossible for the kidneys to work the way they should. If steps are not taken to slow down the kidney damage or stop it from getting worse, the kidneys may stop working permanently. Most of the time, chronic kidney disease does not go away. However, it can often be controlled, and those with the disease can usually live normal lives. °CAUSES °The most common causes of chronic kidney disease are diabetes and high blood pressure (hypertension). Chronic kidney disease may also be caused by: °· Diseases that cause the kidneys' filters to become inflamed. °· Diseases that affect the immune system. °· Genetic diseases. °· Medicines that damage the kidneys, such as anti-inflammatory medicines. °· Poisoning or exposure to toxic substances. °· A reoccurring kidney or urinary infection. °· A problem with urine flow. This may be caused by: °¨ Cancer. °¨ Kidney stones. °¨ An enlarged prostate in males. °SIGNS AND SYMPTOMS °Because the kidney damage in chronic kidney disease occurs slowly, symptoms develop slowly and may not be obvious until the kidney damage becomes severe. A person may have a kidney disease for years without showing any symptoms. Symptoms can include: °· Swelling (edema) of the legs, ankles, or feet. °· Tiredness (lethargy). °· Nausea or vomiting. °· Confusion. °· Problems with urination, such as: °¨ Decreased urine  production. °¨ Frequent urination, especially at night. °¨ Frequent accidents in children who are potty trained. °· Muscle twitches and cramps. °· Shortness of breath. °· Weakness. °· Persistent itchiness. °· Loss of appetite. °· Metallic taste in the mouth. °· Trouble sleeping. °· Slowed development in children. °· Short stature in children. °DIAGNOSIS °Chronic kidney disease may be detected and diagnosed by tests, including blood, urine, imaging, or kidney biopsy tests. °TREATMENT °Most chronic kidney diseases cannot be cured. Treatment usually involves relieving symptoms and preventing or slowing the progression of the disease. Treatment may include: °· A special diet. You may need to avoid alcohol and foods that are salty and high in potassium. °· Medicines. These may: °¨ Lower blood pressure. °¨ Relieve anemia. °¨ Relieve swelling. °¨ Protect the bones. °HOME CARE INSTRUCTIONS °· Follow your prescribed diet.  Your health care provider may instruct you to limit daily salt (sodium) and protein intake. °· Take medicines only as directed by your health care provider. Do not take any new medicines (prescription, over-the-counter, or nutritional supplements) unless approved by your health care provider. Many medicines can worsen your kidney damage or need to have the dose adjusted.   °· Quit smoking if you smoke. Talk to your health care provider about a smoking cessation program. °· Keep all follow-up visits as directed by your health care provider. °· Monitor your blood pressure. °· Start or continue an exercise plan. °· Get immunizations as directed by your health care provider. °· Take vitamin and mineral supplements as directed by your health care provider. °SEEK IMMEDIATE MEDICAL CARE IF: °· Your symptoms get worse or you develop   new symptoms. °· You develop symptoms of end-stage kidney disease. These include: °¨ Headaches. °¨ Abnormally dark or light skin. °¨ Numbness in the hands or feet. °¨ Easy  bruising. °¨ Frequent hiccups. °¨ Menstruation stops. °· You have a fever. °· You have decreased urine production. °· You have pain or bleeding when urinating. °MAKE SURE YOU: °· Understand these instructions. °· Will watch your condition. °· Will get help right away if you are not doing well or get worse. °FOR MORE INFORMATION  °· American Association of Kidney Patients: www.aakp.org °· National Kidney Foundation: www.kidney.org °· American Kidney Fund: www.akfinc.org °· Life [employer] Program: www.lifeoptions.org and www.kidneyschool.org °  °This information is not intended to replace advice given to you by your health care provider. Make sure you discuss any questions you have with your health care provider. °  °Document Released: 03/15/2008 Document Revised: 06/27/2014 Document Reviewed: 02/03/2012 °Elsevier Interactive Patient Education ©2016 Elsevier Inc. ° °

## 2015-05-08 ENCOUNTER — Other Ambulatory Visit: Payer: Self-pay | Admitting: Interventional Cardiology

## 2015-05-08 LAB — CULTURE, BLOOD (ROUTINE X 2)
Culture: NO GROWTH
Culture: NO GROWTH

## 2015-05-11 ENCOUNTER — Telehealth: Payer: Self-pay | Admitting: Vascular Surgery

## 2015-05-11 NOTE — Telephone Encounter (Signed)
-----   Message from Denman George, RN sent at 05/07/2015 12:25 PM EST ----- Regarding: RE: FYI No, I think that you understood correctly; I told DrMikhail that we would contact the pt. @ home for an outpatient appt. She was fine with that.   ----- Message -----    From: Gena Fray    Sent: 05/07/2015  12:04 PM      To: Denman George, RN Subject: RE: Juluis Rainier                                        I was waiting for her to discharge and then call her to schedule. I will go ahead and put an appointment in [place], I think I misunderstood.  Hinton Dyer ----- Message -----    From: Denman George, RN    Sent: 05/07/2015  10:23 AM      To: Gena Fray Subject: FYI                                            rec'd another call from Dr. Ree Kida; stated the pt. will d/c today from [facility]  She wanted to f/u on getting her sched. as OP for eval. for permanent access.  Vein mapping is done.  I told her we were working on it.

## 2015-05-15 ENCOUNTER — Encounter (HOSPITAL_COMMUNITY)
Admission: RE | Admit: 2015-05-15 | Discharge: 2015-05-15 | Disposition: A | Discharge status: Home / Self Care | Payer: Medicare Other | Source: Ambulatory Visit | Attending: Nephrology | Admitting: Nephrology

## 2015-05-15 DIAGNOSIS — D631 Anemia in chronic kidney disease: Secondary | ICD-10-CM | POA: Diagnosis not present

## 2015-05-15 DIAGNOSIS — Z79899 Other long term (current) drug therapy: Secondary | ICD-10-CM | POA: Diagnosis not present

## 2015-05-15 DIAGNOSIS — N184 Chronic kidney disease, stage 4 (severe): Secondary | ICD-10-CM | POA: Diagnosis not present

## 2015-05-15 DIAGNOSIS — Z5181 Encounter for therapeutic drug level monitoring: Secondary | ICD-10-CM | POA: Diagnosis not present

## 2015-05-15 MED ORDER — DARBEPOETIN ALFA 60 MCG/0.3ML IJ SOSY
PREFILLED_SYRINGE | INTRAMUSCULAR | Status: AC
  Filled 2015-05-15: qty 0.3

## 2015-05-15 MED ORDER — DARBEPOETIN ALFA 60 MCG/0.3ML IJ SOSY
60.0000 ug | PREFILLED_SYRINGE | INTRAMUSCULAR | Status: DC
  Administered 2015-05-15: 60 ug via SUBCUTANEOUS

## 2015-05-18 ENCOUNTER — Encounter: Payer: Self-pay | Admitting: Vascular Surgery

## 2015-05-18 LAB — POCT HEMOGLOBIN-HEMACUE: Hemoglobin: 10.3 g/dL — ABNORMAL LOW (ref 12.0–15.0)

## 2015-05-20 ENCOUNTER — Ambulatory Visit (INDEPENDENT_AMBULATORY_CARE_PROVIDER_SITE_OTHER): Payer: Medicare Other | Admitting: Vascular Surgery

## 2015-05-20 ENCOUNTER — Encounter: Payer: Self-pay | Admitting: Vascular Surgery

## 2015-05-20 VITALS — BP 115/74 | HR 79 | Temp 97.6°F | Resp 14 | Ht 63.0 in | Wt 170.0 lb

## 2015-05-20 DIAGNOSIS — N184 Chronic kidney disease, stage 4 (severe): Secondary | ICD-10-CM

## 2015-05-20 DIAGNOSIS — N179 Acute kidney failure, unspecified: Secondary | ICD-10-CM | POA: Diagnosis not present

## 2015-05-20 NOTE — Progress Notes (Signed)
Referred by:  Lajean Manes, MD [address] [place], [place] [address]  Reason for referral: New access  History of Present Illness  [name] is a 69 y.o. ([birth year]) female who presents for evaluation for permanent access.  The patient is right hand dominant.  The patient has not had previous access procedures.  Previous central venous cannulation procedures include: RIJV TDC.  The patient has never had a PPM placed.  The patient briefly start HD then was able to come off HD rapidly.   Past Medical History  Diagnosis Date  . GERD (gastroesophageal reflux disease)   . Glaucoma   . H/O renal calculi 2002 & 2006  . H/O hiatal hernia   . Headache(784.0)     migraine  . Pneumonia     dx 10-06-2014 per CXR--  on 10-27-2014 pt states finished antibiotic and denies cough or fever  . Hyperlipidemia   . History of MI (myocardial infarction)     10/ 2013 in setting of Septic Shock  . History of atrial fibrillation     10/ 2013  in setting of Septic Shock  . History of CHF (congestive heart failure)     10/ 2013 in setting of septic shock  . Sigmoid diverticulosis   . Nephrolithiasis     bilateral  . CKD (chronic kidney disease), stage III     nephrologist-  dr Florene Glen  . Right ureteral stone   . History of acute respiratory failure     10/ 2013  -- ventilated in setting septic shock  . Dysrhythmia     Afib in 2013 when she had septic shock related to stone ureteral obstruction  . Myocardial infarction [facility])     Was reported in 2013 during hospitalization with septic shock  . Septic shock ([place]) 04/04/2012  . Sepsis ([place])   . Complication of anesthesia     use a little anesthesia , per patient MD states she quit breathing   . Hypertension     medication removed from regimen due to low blood pressure   . Peripheral vascular disease ([employer])   . S/P hemodialysis catheter insertion ([place]) 04/11/2015     right anterior chest , only used once   . History of  nephrostomy 04/11/2015     currently inplace 04/28/2015   . History of blood transfusion 04/13/2015     Past Surgical History  Procedure Laterality Date  . Cystoscopy w/ ureteral stent placement  04/04/2012    Procedure: CYSTOSCOPY WITH RETROGRADE PYELOGRAM/URETERAL STENT PLACEMENT;  Surgeon: Ailene Rud, MD;  Location: [facility];  Service: Urology;  Laterality: Left;  . Total abdominal hysterectomy w/ bilateral salpingoophorectomy  1993    secondary to fibroids  . Breast biopsy Left 08/23/07    benign fibrocystic with duct ectasia  . Cardiac catheterization  07-11-2012  dr Irish Lack    Abnormal stress test/   normal coronary arteries/  LVEDP  54mmHg  . Cardiovascular stress test  06-26-2012  dr Irish Lack    marked ischemia in the basal anterior, mid anterior, apical inferior regions/  normal LVF, ef 63%  . Transthoracic echocardiogram  04-09-2012    normal LVF,  ef 60-65%,  mild LAE,  mild TR, trivial MR and PR  . Cataract extraction w/ intraocular lens  implant, bilateral    . Knee arthroscopy Left 02-14-2003  . Laparoscopic cholecystectomy  03-23-2005  . Extracorporeal shock wave lithotripsy  05-28-2012  &  10-08-2012  . Cystoscopy  with stent placement Right 10/28/2014    Procedure: RIGHT URETERAL STENT PLACEMENT;  Surgeon: Irine Seal, MD;  Location[facility];  Service: Urology;  Laterality: Right;  . Cystoscopy/retrograde/ureteroscopy/stone extraction with basket Right 11/21/2014    Procedure: CYSTOSCOPY/RIGHT RETROGRADE PYELOGRAM/RIGHT URETEROSCOPY/BASKET EXTRACTION/RIGHT PYELOSCOPY/LASER OF STONE/RIGHT DOUBLE J STENT;  Surgeon: Carolan Clines, MD;  Location: [facility];  Service: Urology;  Laterality: Right;  . Holmium laser application Right [id number]    Procedure: HOLMIUM LASER APPLICATION;  Surgeon: Carolan Clines, MD;  Location: [facility];  Service: Urology;  Laterality: Right;  . Cystoscopy with stent placement Right  02/26/2015    Procedure: CYSTOSCOPY RETROGRADE PYELOGRAM WITH STENT PLACEMENT;  Surgeon: Cleon Gustin, MD;  Location: [facility];  Service: Urology;  Laterality: Right;  . Cystoscopy w/ ureteral stent placement Bilateral 05/04/2015    Procedure: CYSTOSCOPY WITH BILATERAL RETROGRADE PYELOGRAM/ WITH INTERPRETATION, EXCHANGE OF RIGHT URETERAL STENT REPLACEMENT AND PLACEMENT LEFT URETERAL STENT PLACEMENT EXAMINATION OF VAGINA;  Surgeon: Carolan Clines, MD;  Location: [facility];  Service: Urology;  Laterality: Bilateral;    Social History   Social History  . Marital Status: Widowed    Spouse Name: N/A  . Number of Children: 1  . Years of Education: N/A   Occupational History  . retired   . legal assistant    Social History Main Topics  . Smoking status: Never Smoker   . Smokeless tobacco: Never Used  . Alcohol Use: No  . Drug Use: No  . Sexual Activity: No     Comment: widow husband passed 5/05 with lung cancer   Other Topics Concern  . Not on file   Social History Narrative    Family History  Problem Relation Age of Onset  . Hypertension Mother   . Cancer Mother 67    breast  . Dementia Mother   . Hypertension Brother   . Diabetes Brother   . Cancer Father 11    pancreatic  . Heart failure Paternal Grandmother   . Bladder Cancer Maternal Grandfather     Current Outpatient Prescriptions  Medication Sig Dispense Refill  . acetaminophen (TYLENOL) 500 MG tablet Take 500 mg by mouth every 6 (six) hours as needed for moderate pain.    Marland Kitchen acidophilus (RISAQUAD) CAPS capsule Take 2 capsules by mouth daily. 60 capsule 0  . bimatoprost (LUMIGAN) 0.01 % SOLN Place 1 drop into both eyes at bedtime.    . Nutritional Supplements (FEEDING SUPPLEMENT, NEPRO CARB STEADY,) LIQD Take 237 mLs by mouth daily. 30 Can 0  . ranitidine (ZANTAC) 150 MG tablet Take 150 mg by mouth 2 (two) times daily as needed for heartburn.    . saccharomyces boulardii (FLORASTOR) 250 MG capsule Take 1 capsule  (250 mg total) by mouth 2 (two) times daily. 60 capsule 0  . sodium chloride (OCEAN) 0.65 % SOLN nasal spray Place 1 spray into both nostrils 3 (three) times daily as needed for congestion.    . timolol (BETIMOL) 0.5 % ophthalmic solution Place 1 drop into both eyes every morning.     Marland Kitchen amoxicillin-clavulanate (AUGMENTIN) 250-125 MG tablet Take 1 tablet by mouth 2 (two) times daily. (Patient not taking: Reported on 05/20/2015) 10 tablet 0  . ENSURE PLUS (ENSURE PLUS) LIQD Take 237 mLs by mouth daily as needed (nutritional supplement).     Marland Kitchen HYDROcodone-acetaminophen (NORCO/VICODIN) 5-325 MG tablet Take 1 tablet by mouth 4 (four) times daily as needed for moderate pain.   0  No current facility-administered medications for this visit.    Allergies  Allergen Reactions  . Vicodin [Hydrocodone-Acetaminophen] Nausea And Vomiting  . Percocet [Oxycodone-Acetaminophen] Nausea And Vomiting    REVIEW OF SYSTEMS:  (Positives checked otherwise negative)  CARDIOVASCULAR:   [ ]  chest pain,  [ ]  chest pressure,  [ ]  palpitations,  [ ]  shortness of breath when laying flat,  [ ]  shortness of breath with exertion,   [ ]  pain in feet when walking,  [ ]  pain in feet when laying flat, [ ]  history of blood clot in veins (DVT),  [ ]  history of phlebitis,  [ ]  swelling in legs,  [ ]  varicose veins  PULMONARY:   [ ]  productive cough,  [ ]  asthma,  [ ]  wheezing  NEUROLOGIC:   [ ]  weakness in arms or legs,  [ ]  numbness in arms or legs,  [ ]  difficulty speaking or slurred speech,  [ ]  temporary loss of vision in one eye,  [ ]  dizziness  HEMATOLOGIC:   [ ]  bleeding problems,  [ ]  problems with blood clotting too easily  MUSCULOSKEL:   [ ]  joint pain, [ ]  joint swelling  GASTROINTEST:   [ ]  vomiting blood,  [ ]  blood in stool     GENITOURINARY:   [ ]  burning with urination,  [ ]  blood in urine  PSYCHIATRIC:   [ ]  history of major depression  INTEGUMENTARY:   [ ]  rashes,  [ ]   ulcers  CONSTITUTIONAL:   [ ]  fever,  [ ]  chills   Physical Examination  Filed Vitals:   05/20/15 1558  BP: 115/74  Pulse: 79  Temp: 97.6 F (36.4 C)  Resp: 14  Height: 5\' 3"  (1.6 m)  Weight: 170 lb (77.111 kg)  SpO2: 97%   Body mass index is 30.12 kg/(m^2).  General: A&O x 3, WDWN  Head: [place]/AT  Ear/Nose/Throat: Hearing grossly intact, nares w/o erythema or drainage, oropharynx w/o Erythema/Exudate, Mallampati score: 3  Eyes: PERRLA, EOMI  Neck: Supple, no nuchal rigidity, no palpable LAD, short thick ness  Pulmonary: Sym exp, good air movt, CTAB, no rales, rhonchi, & wheezing  Cardiac: RRR, Nl S1, S2, no Murmurs, rubs or gallops  Vascular: Vessel Right Left  Radial Palpable Palpable  Ulnar Palpable Palpable  Brachial Palpable Palpable  Carotid Palpable, without bruit Palpable, without bruit  Aorta Not palpable N/A  Femoral Palpable Palpable  Popliteal Not palpable Not palpable  PT Not Palpable Not Palpable  DP  Not Palpable Not Palpable   Gastrointestinal: soft, NTND, -G/R, - HSM, - masses, - CVAT B  Musculoskeletal: M/S 5/5 throughout , Extremities without ischemic changes   Neurologic: CN 2-12 intact , Pain and light touch intact in extremities , Motor exam as listed above  Psychiatric: Judgment intact, Mood & affect appropriate for pt's clinical situation  Dermatologic: See M/S exam for extremity exam, no rashes otherwise noted  Lymph : No Cervical, Axillary, or Inguinal lymphadenopathy   Non-Invasive Vascular Imaging  Vein Mapping  (Date: 04/12/15):   R arm: acceptable vein conduits include upper arm cephalic, upper arm basilic  L arm: acceptable vein conduits include upper arm cephalic and basilic   Medical Decision Making  [name] is a 69 y.o. female who presents with CKD Stage V.   Based on vein mapping and examination, this patient's permanent access options include: L BC AVF, L BVT (possible single stage), R BC AVF, R BVT  (possible single stage).  I would start with: L BC AVF  I had an extensive discussion with this patient in regards to the nature of access surgery, including risk, benefits, and alternatives.    The patient is aware that the risks of access surgery include but are not limited to: bleeding, infection, steal syndrome, nerve damage, ischemic monomelic neuropathy, failure of access to mature, and possible need for additional access procedures in the future.  The patient has agreed to proceed with the above procedure which will be scheduled 6 DEC 16.   Adele Barthel, MD [facility] Office: [phone] Pager: [phone]  05/20/2015, 4:42 PM

## 2015-05-21 ENCOUNTER — Other Ambulatory Visit (HOSPITAL_COMMUNITY): Payer: Self-pay | Admitting: *Deleted

## 2015-05-22 ENCOUNTER — Encounter (HOSPITAL_COMMUNITY): Payer: Self-pay | Admitting: Emergency Medicine

## 2015-05-22 ENCOUNTER — Other Ambulatory Visit: Payer: Self-pay

## 2015-05-22 ENCOUNTER — Other Ambulatory Visit (HOSPITAL_COMMUNITY): Payer: Self-pay

## 2015-05-22 ENCOUNTER — Encounter (HOSPITAL_COMMUNITY)
Admission: RE | Admit: 2015-05-22 | Discharge: 2015-05-22 | Disposition: A | Discharge status: Home / Self Care | Payer: Medicare Other | Source: Ambulatory Visit | Attending: Nephrology | Admitting: Nephrology

## 2015-05-22 ENCOUNTER — Other Ambulatory Visit: Payer: Self-pay | Admitting: *Deleted

## 2015-05-22 ENCOUNTER — Emergency Department (HOSPITAL_COMMUNITY): Payer: Medicare Other

## 2015-05-22 ENCOUNTER — Inpatient Hospital Stay (HOSPITAL_COMMUNITY)
Admission: EM | Admit: 2015-05-22 | Discharge: 2015-05-31 | DRG: 871 | Disposition: A | Discharge status: Home Health | Payer: Medicare Other | Attending: Internal Medicine | Admitting: Internal Medicine

## 2015-05-22 DIAGNOSIS — Z90710 Acquired absence of both cervix and uterus: Secondary | ICD-10-CM

## 2015-05-22 DIAGNOSIS — Z8249 Family history of ischemic heart disease and other diseases of the circulatory system: Secondary | ICD-10-CM | POA: Diagnosis not present

## 2015-05-22 DIAGNOSIS — E876 Hypokalemia: Secondary | ICD-10-CM | POA: Diagnosis not present

## 2015-05-22 DIAGNOSIS — N10 Acute tubulo-interstitial nephritis: Secondary | ICD-10-CM | POA: Diagnosis not present

## 2015-05-22 DIAGNOSIS — E785 Hyperlipidemia, unspecified: Secondary | ICD-10-CM | POA: Diagnosis present

## 2015-05-22 DIAGNOSIS — I5032 Chronic diastolic (congestive) heart failure: Secondary | ICD-10-CM | POA: Diagnosis present

## 2015-05-22 DIAGNOSIS — I480 Paroxysmal atrial fibrillation: Secondary | ICD-10-CM | POA: Diagnosis present

## 2015-05-22 DIAGNOSIS — N186 End stage renal disease: Secondary | ICD-10-CM | POA: Diagnosis not present

## 2015-05-22 DIAGNOSIS — A419 Sepsis, unspecified organism: Secondary | ICD-10-CM | POA: Diagnosis not present

## 2015-05-22 DIAGNOSIS — Z885 Allergy status to narcotic agent status: Secondary | ICD-10-CM

## 2015-05-22 DIAGNOSIS — K5730 Diverticulosis of large intestine without perforation or abscess without bleeding: Secondary | ICD-10-CM | POA: Diagnosis present

## 2015-05-22 DIAGNOSIS — Z5181 Encounter for therapeutic drug level monitoring: Secondary | ICD-10-CM | POA: Insufficient documentation

## 2015-05-22 DIAGNOSIS — E872 Acidosis, unspecified: Secondary | ICD-10-CM | POA: Insufficient documentation

## 2015-05-22 DIAGNOSIS — I739 Peripheral vascular disease, unspecified: Secondary | ICD-10-CM | POA: Diagnosis present

## 2015-05-22 DIAGNOSIS — N184 Chronic kidney disease, stage 4 (severe): Secondary | ICD-10-CM | POA: Insufficient documentation

## 2015-05-22 DIAGNOSIS — E875 Hyperkalemia: Secondary | ICD-10-CM | POA: Insufficient documentation

## 2015-05-22 DIAGNOSIS — I2510 Atherosclerotic heart disease of native coronary artery without angina pectoris: Secondary | ICD-10-CM | POA: Diagnosis present

## 2015-05-22 DIAGNOSIS — D631 Anemia in chronic kidney disease: Secondary | ICD-10-CM | POA: Insufficient documentation

## 2015-05-22 DIAGNOSIS — D638 Anemia in other chronic diseases classified elsewhere: Secondary | ICD-10-CM | POA: Diagnosis present

## 2015-05-22 DIAGNOSIS — R509 Fever, unspecified: Secondary | ICD-10-CM

## 2015-05-22 DIAGNOSIS — N1330 Unspecified hydronephrosis: Secondary | ICD-10-CM | POA: Insufficient documentation

## 2015-05-22 DIAGNOSIS — N390 Urinary tract infection, site not specified: Secondary | ICD-10-CM | POA: Diagnosis not present

## 2015-05-22 DIAGNOSIS — Z9841 Cataract extraction status, right eye: Secondary | ICD-10-CM | POA: Diagnosis not present

## 2015-05-22 DIAGNOSIS — Z9049 Acquired absence of other specified parts of digestive tract: Secondary | ICD-10-CM

## 2015-05-22 DIAGNOSIS — N289 Disorder of kidney and ureter, unspecified: Secondary | ICD-10-CM

## 2015-05-22 DIAGNOSIS — H409 Unspecified glaucoma: Secondary | ICD-10-CM | POA: Diagnosis present

## 2015-05-22 DIAGNOSIS — E860 Dehydration: Secondary | ICD-10-CM | POA: Diagnosis present

## 2015-05-22 DIAGNOSIS — Z961 Presence of intraocular lens: Secondary | ICD-10-CM | POA: Diagnosis present

## 2015-05-22 DIAGNOSIS — Z87442 Personal history of urinary calculi: Secondary | ICD-10-CM | POA: Diagnosis not present

## 2015-05-22 DIAGNOSIS — E43 Unspecified severe protein-calorie malnutrition: Secondary | ICD-10-CM | POA: Diagnosis present

## 2015-05-22 DIAGNOSIS — N132 Hydronephrosis with renal and ureteral calculous obstruction: Secondary | ICD-10-CM | POA: Diagnosis not present

## 2015-05-22 DIAGNOSIS — K449 Diaphragmatic hernia without obstruction or gangrene: Secondary | ICD-10-CM | POA: Diagnosis present

## 2015-05-22 DIAGNOSIS — Z6830 Body mass index (BMI) 30.0-30.9, adult: Secondary | ICD-10-CM | POA: Diagnosis not present

## 2015-05-22 DIAGNOSIS — E861 Hypovolemia: Secondary | ICD-10-CM | POA: Diagnosis not present

## 2015-05-22 DIAGNOSIS — A412 Sepsis due to unspecified staphylococcus: Secondary | ICD-10-CM | POA: Diagnosis present

## 2015-05-22 DIAGNOSIS — N136 Pyonephrosis: Secondary | ICD-10-CM | POA: Diagnosis present

## 2015-05-22 DIAGNOSIS — N185 Chronic kidney disease, stage 5: Secondary | ICD-10-CM | POA: Diagnosis present

## 2015-05-22 DIAGNOSIS — K219 Gastro-esophageal reflux disease without esophagitis: Secondary | ICD-10-CM | POA: Diagnosis present

## 2015-05-22 DIAGNOSIS — N189 Chronic kidney disease, unspecified: Secondary | ICD-10-CM

## 2015-05-22 DIAGNOSIS — R6520 Severe sepsis without septic shock: Secondary | ICD-10-CM | POA: Diagnosis present

## 2015-05-22 DIAGNOSIS — Z8052 Family history of malignant neoplasm of bladder: Secondary | ICD-10-CM

## 2015-05-22 DIAGNOSIS — Z936 Other artificial openings of urinary tract status: Secondary | ICD-10-CM | POA: Diagnosis not present

## 2015-05-22 DIAGNOSIS — N200 Calculus of kidney: Secondary | ICD-10-CM | POA: Diagnosis present

## 2015-05-22 DIAGNOSIS — I252 Old myocardial infarction: Secondary | ICD-10-CM | POA: Diagnosis not present

## 2015-05-22 DIAGNOSIS — Z79899 Other long term (current) drug therapy: Secondary | ICD-10-CM | POA: Diagnosis not present

## 2015-05-22 DIAGNOSIS — R944 Abnormal results of kidney function studies: Secondary | ICD-10-CM | POA: Diagnosis present

## 2015-05-22 DIAGNOSIS — N179 Acute kidney failure, unspecified: Secondary | ICD-10-CM | POA: Diagnosis present

## 2015-05-22 DIAGNOSIS — Z833 Family history of diabetes mellitus: Secondary | ICD-10-CM

## 2015-05-22 DIAGNOSIS — I132 Hypertensive heart and chronic kidney disease with heart failure and with stage 5 chronic kidney disease, or end stage renal disease: Secondary | ICD-10-CM | POA: Diagnosis present

## 2015-05-22 DIAGNOSIS — I10 Essential (primary) hypertension: Secondary | ICD-10-CM | POA: Diagnosis present

## 2015-05-22 DIAGNOSIS — I959 Hypotension, unspecified: Secondary | ICD-10-CM

## 2015-05-22 DIAGNOSIS — Z9842 Cataract extraction status, left eye: Secondary | ICD-10-CM

## 2015-05-22 DIAGNOSIS — B9562 Methicillin resistant Staphylococcus aureus infection as the cause of diseases classified elsewhere: Secondary | ICD-10-CM | POA: Diagnosis not present

## 2015-05-22 LAB — URINALYSIS, ROUTINE W REFLEX MICROSCOPIC
Bilirubin Urine: NEGATIVE
Glucose, UA: NEGATIVE mg/dL
Ketones, ur: NEGATIVE mg/dL
Nitrite: POSITIVE — AB
Protein, ur: >300 mg/dL — AB
Specific Gravity, Urine: 1.014 (ref 1.005–1.030)
pH: 6 (ref 5.0–8.0)

## 2015-05-22 LAB — COMPREHENSIVE METABOLIC PANEL
ALT: 19 U/L (ref 14–54)
AST: 21 U/L (ref 15–41)
Albumin: 3.4 g/dL — ABNORMAL LOW (ref 3.5–5.0)
Alkaline Phosphatase: 107 U/L (ref 38–126)
Anion gap: 13 (ref 5–15)
BUN: 97 mg/dL — ABNORMAL HIGH (ref 6–20)
CO2: 16 mmol/L — ABNORMAL LOW (ref 22–32)
Calcium: 9.6 mg/dL (ref 8.9–10.3)
Chloride: 105 mmol/L (ref 101–111)
Creatinine, Ser: 6.58 mg/dL — ABNORMAL HIGH (ref 0.44–1.00)
GFR calc Af Amer: 7 mL/min — ABNORMAL LOW (ref >60)
GFR calc non Af Amer: 6 mL/min — ABNORMAL LOW (ref >60)
Glucose, Bld: 144 mg/dL — ABNORMAL HIGH (ref 65–99)
Potassium: 5.2 mmol/L — ABNORMAL HIGH (ref 3.5–5.1)
Sodium: 134 mmol/L — ABNORMAL LOW (ref 135–145)
Total Bilirubin: 0.7 mg/dL (ref 0.3–1.2)
Total Protein: 7.5 g/dL (ref 6.5–8.1)

## 2015-05-22 LAB — CBC WITH DIFFERENTIAL/PLATELET
Basophils Absolute: 0 10*3/uL (ref 0.0–0.1)
Basophils Relative: 0 %
Eosinophils Absolute: 0 10*3/uL (ref 0.0–0.7)
Eosinophils Relative: 0 %
HCT: 41.4 % (ref 36.0–46.0)
Hemoglobin: 12.9 g/dL (ref 12.0–15.0)
Lymphocytes Relative: 3 %
Lymphs Abs: 0.7 10*3/uL (ref 0.7–4.0)
MCH: 28.1 pg (ref 26.0–34.0)
MCHC: 31.2 g/dL (ref 30.0–36.0)
MCV: 90.2 fL (ref 78.0–100.0)
Monocytes Absolute: 0.4 10*3/uL (ref 0.1–1.0)
Monocytes Relative: 2 %
Neutro Abs: 21.1 10*3/uL — ABNORMAL HIGH (ref 1.7–7.7)
Neutrophils Relative %: 95 %
Platelets: 367 10*3/uL (ref 150–400)
RBC: 4.59 MIL/uL (ref 3.87–5.11)
RDW: 17 % — ABNORMAL HIGH (ref 11.5–15.5)
WBC: 22.2 10*3/uL — ABNORMAL HIGH (ref 4.0–10.5)

## 2015-05-22 LAB — FERRITIN: Ferritin: 230 ng/mL (ref 11–307)

## 2015-05-22 LAB — URINE MICROSCOPIC-ADD ON

## 2015-05-22 LAB — I-STAT CG4 LACTIC ACID, ED
Lactic Acid, Venous: 1.82 mmol/L (ref 0.5–2.0)
Lactic Acid, Venous: 0.9 mmol/L (ref 0.5–2.0)

## 2015-05-22 LAB — IRON AND TIBC
Iron: 21 ug/dL — ABNORMAL LOW (ref 28–170)
Saturation Ratios: 9 % — ABNORMAL LOW (ref 10.4–31.8)
TIBC: 244 ug/dL — ABNORMAL LOW (ref 250–450)
UIBC: 223 ug/dL

## 2015-05-22 LAB — POCT HEMOGLOBIN-HEMACUE: Hemoglobin: 11.1 g/dL — ABNORMAL LOW (ref 12.0–15.0)

## 2015-05-22 MED ORDER — DEXTROSE 5 % IV SOLN
1.0000 g | Freq: Once | INTRAVENOUS | Status: AC
  Administered 2015-05-22: 1 g via INTRAVENOUS
  Filled 2015-05-22: qty 10

## 2015-05-22 MED ORDER — ACETAMINOPHEN 325 MG PO TABS
650.0000 mg | ORAL_TABLET | Freq: Once | ORAL | Status: AC
  Administered 2015-05-22: 650 mg via ORAL
  Filled 2015-05-22: qty 2

## 2015-05-22 MED ORDER — DARBEPOETIN ALFA 60 MCG/0.3ML IJ SOSY
PREFILLED_SYRINGE | INTRAMUSCULAR | Status: AC
  Administered 2015-05-22: 60 ug via SUBCUTANEOUS
  Filled 2015-05-22: qty 0.3

## 2015-05-22 MED ORDER — DARBEPOETIN ALFA 60 MCG/0.3ML IJ SOSY: 60.0000 ug | PREFILLED_SYRINGE | INTRAMUSCULAR | Status: DC

## 2015-05-22 NOTE — ED Provider Notes (Signed)
CSN: [id number]     Arrival date & time 05/22/15  1817 History   First MD Initiated Contact with Patient 05/22/15 1908     Chief Complaint  Patient presents with  . Weakness     (Consider location/radiation/quality/duration/timing/severity/associated sxs/prior Treatment) HPI   [name] is a 69 y.o. female who is here for evaluation of decreased oral intake, and urine output, nausea, and malaise, which started today. She is due to have a fistula placed for possible dialysis, in the future. She has a minimally functioning left kidney, and recently had a nephrostomy from her right kidney removed. She apparently has ureteral stents as well. She has been eating today. She had a low-grade temperature of 100.3 at home today. There's been no cough, headache problems walking, abdominal pain, back pain or neck pain. There are no other known modifying factors.   Past Medical History  Diagnosis Date  . GERD (gastroesophageal reflux disease)   . Glaucoma   . H/O renal calculi 2002 & 2006  . H/O hiatal hernia   . Headache(784.0)     migraine  . Pneumonia     dx 10-06-2014 per CXR--  on 10-27-2014 pt states finished antibiotic and denies cough or fever  . Hyperlipidemia   . History of MI (myocardial infarction)     10/ 2013 in setting of Septic Shock  . History of atrial fibrillation     10/ 2013  in setting of Septic Shock  . History of CHF (congestive heart failure)     10/ 2013 in setting of septic shock  . Sigmoid diverticulosis   . Nephrolithiasis     bilateral  . CKD (chronic kidney disease), stage III     nephrologist-  dr Florene Glen  . Right ureteral stone   . History of acute respiratory failure     10/ 2013  -- ventilated in setting septic shock  . Dysrhythmia     Afib in 2013 when she had septic shock related to stone ureteral obstruction  . Myocardial infarction [facility])     Was reported in 2013 during hospitalization with septic shock  . Septic shock ([place]) 04/04/2012  .  Sepsis ([place])   . Complication of anesthesia     use a little anesthesia , per patient MD states she quit breathing   . Hypertension     medication removed from regimen due to low blood pressure   . Peripheral vascular disease ([place])   . S/P hemodialysis catheter insertion ([place]) 04/11/2015     right anterior chest , only used once   . History of nephrostomy 04/11/2015     currently inplace 04/28/2015   . History of blood transfusion 04/13/2015    Past Surgical History  Procedure Laterality Date  . Cystoscopy w/ ureteral stent placement  04/04/2012    Procedure: CYSTOSCOPY WITH RETROGRADE PYELOGRAM/URETERAL STENT PLACEMENT;  Surgeon: Ailene Rud, MD;  Location: [place];  Service: Urology;  Laterality: Left;  . Total abdominal hysterectomy w/ bilateral salpingoophorectomy  1993    secondary to fibroids  . Breast biopsy Left 08/23/07    benign fibrocystic with duct ectasia  . Cardiac catheterization  07-11-2012  dr Irish Lack    Abnormal stress test/   normal coronary arteries/  LVEDP  28mmHg  . Cardiovascular stress test  06-26-2012  dr Irish Lack    marked ischemia in the basal anterior, mid anterior, apical inferior regions/  normal LVF, ef 63%  . Transthoracic echocardiogram  04-09-2012  normal LVF,  ef 60-65%,  mild LAE,  mild TR, trivial MR and PR  . Cataract extraction w/ intraocular lens  implant, bilateral    . Knee arthroscopy Left 02-14-2003  . Laparoscopic cholecystectomy  03-23-2005  . Extracorporeal shock wave lithotripsy  05-28-2012  &  10-08-2012  . Cystoscopy with stent placement Right 10/28/2014    Procedure: RIGHT URETERAL STENT PLACEMENT;  Surgeon: Irine Seal, MD;  Location: [facility];  Service: Urology;  Laterality: Right;  . Cystoscopy/retrograde/ureteroscopy/stone extraction with basket Right 11/21/2014    Procedure: CYSTOSCOPY/RIGHT RETROGRADE PYELOGRAM/RIGHT URETEROSCOPY/BASKET EXTRACTION/RIGHT PYELOSCOPY/LASER OF STONE/RIGHT DOUBLE J STENT;   Surgeon: Carolan Clines, MD;  Location: [place];  Service: Urology;  Laterality: Right;  . Holmium laser application Right [id number]    Procedure: HOLMIUM LASER APPLICATION;  Surgeon: Carolan Clines, MD;  Location: [facility];  Service: Urology;  Laterality: Right;  . Cystoscopy with stent placement Right 02/26/2015    Procedure: CYSTOSCOPY RETROGRADE PYELOGRAM WITH STENT PLACEMENT;  Surgeon: Cleon Gustin, MD;  Location: [facility];  Service: Urology;  Laterality: Right;  . Cystoscopy w/ ureteral stent placement Bilateral 05/04/2015    Procedure: CYSTOSCOPY WITH BILATERAL RETROGRADE PYELOGRAM/ WITH INTERPRETATION, EXCHANGE OF RIGHT URETERAL STENT REPLACEMENT AND PLACEMENT LEFT URETERAL STENT PLACEMENT EXAMINATION OF VAGINA;  Surgeon: Carolan Clines, MD;  Location: [facility];  Service: Urology;  Laterality: Bilateral;   Family History  Problem Relation Age of Onset  . Hypertension Mother   . Cancer Mother 97    breast  . Dementia Mother   . Hypertension Brother   . Diabetes Brother   . Cancer Father 75    pancreatic  . Heart failure Paternal Grandmother   . Bladder Cancer Maternal Grandfather    Social History  Substance Use Topics  . Smoking status: Never Smoker   . Smokeless tobacco: Never Used  . Alcohol Use: No   OB History    Gravida Para Term Preterm AB TAB SAB Ectopic Multiple Living   1 1 1       1      Review of Systems  All other systems reviewed and are negative.     Allergies  Vicodin and Percocet  Home Medications   Prior to Admission medications   Medication Sig Start Date End Date Taking? Authorizing Provider  acetaminophen (TYLENOL) 500 MG tablet Take 500 mg by mouth every 6 (six) hours as needed for moderate pain.   Yes Historical Provider, MD  acidophilus (RISAQUAD) CAPS capsule Take 2 capsules by mouth daily. 05/07/15  Yes Maryann Mikhail, DO  bimatoprost (LUMIGAN) 0.01 % SOLN Place 1 drop into both eyes at  bedtime.   Yes Historical Provider, MD  ENSURE PLUS (ENSURE PLUS) LIQD Take 237 mLs by mouth daily as needed (nutritional supplement).    Yes Historical Provider, MD  Nutritional Supplements (FEEDING SUPPLEMENT, NEPRO CARB STEADY,) LIQD Take 237 mLs by mouth daily. 05/07/15  Yes Maryann Mikhail, DO  ranitidine (ZANTAC) 150 MG tablet Take 150 mg by mouth 2 (two) times daily as needed for heartburn.   Yes Historical Provider, MD  timolol (BETIMOL) 0.5 % ophthalmic solution Place 1 drop into both eyes every morning.    Yes Historical Provider, MD  amoxicillin-clavulanate (AUGMENTIN) 250-125 MG tablet Take 1 tablet by mouth 2 (two) times daily. Patient not taking: Reported on 05/20/2015 05/07/15   Velta Addison Mikhail, DO  HYDROcodone-acetaminophen (NORCO/VICODIN) 5-325 MG tablet Take 1 tablet by mouth 4 (four) times daily as needed  for moderate pain.  04/27/15   Historical Provider, MD  saccharomyces boulardii (FLORASTOR) 250 MG capsule Take 1 capsule (250 mg total) by mouth 2 (two) times daily. Patient not taking: Reported on 05/22/2015 05/07/15   Velta Addison Mikhail, DO  sodium chloride (OCEAN) 0.65 % SOLN nasal spray Place 1 spray into both nostrils 3 (three) times daily as needed for congestion.    Historical Provider, MD   BP 109/52 mmHg  Pulse 101  Temp(Src) 102.1 F (38.9 C) (Rectal)  Resp 22  Ht 5\' 3"  (1.6 m)  Wt 171 lb (77.565 kg)  BMI 30.30 kg/m2  SpO2 99%  LMP 01/19/1992 Physical Exam  Constitutional: She is oriented to person, place, and time. She appears well-developed.  Elderly, frail  HENT:  Head: Normocephalic and atraumatic.  Right Ear: External ear normal.  Left Ear: External ear normal.  Eyes: Conjunctivae and EOM are normal. Pupils are equal, round, and reactive to light.  Neck: Normal range of motion and phonation normal. Neck supple.  Cardiovascular: Normal rate, regular rhythm and normal heart sounds.   Pulmonary/Chest: Effort normal and breath sounds normal. She exhibits no  bony tenderness.  She is tachypneic  Abdominal: Soft. There is no tenderness.  Musculoskeletal: Normal range of motion. She exhibits no edema or tenderness.  Neurological: She is alert and oriented to person, place, and time. No cranial nerve deficit or sensory deficit. She exhibits normal muscle tone. Coordination normal.  Skin: Skin is warm, dry and intact.  Psychiatric: She has a normal mood and affect. Her behavior is normal. Judgment and thought content normal.  Nursing note and vitals reviewed.   ED Course  Procedures (including critical care time)  Medications  acetaminophen (TYLENOL) tablet 650 mg (650 mg Oral Given 05/22/15 2024)  cefTRIAXone (ROCEPHIN) 1 g in dextrose 5 % 50 mL IVPB (1 g Intravenous New Bag/Given 05/22/15 2209)    Patient Vitals for the past 24 hrs:  BP Temp Temp src Pulse Resp SpO2 Height Weight  05/22/15 2215 (!) 109/52 mmHg - - 101 22 99 % - -  05/22/15 2200 109/57 mmHg - - 112 23 94 % - -  05/22/15 2155 - 102.1 F (38.9 C) Rectal - - - - -  05/22/15 2150 (!) 105/53 mmHg - - 115 25 95 % - -  05/22/15 2130 (!) 106/48 mmHg - - 119 20 96 % - -  05/22/15 2115 (!) 120/53 mmHg - - 110 25 95 % - -  05/22/15 2100 110/55 mmHg - - 111 24 95 % - -  05/22/15 2056 (!) 133/48 mmHg - - 112 (!) 27 96 % - -  05/22/15 2045 (!) 133/48 mmHg - - 113 24 96 % - -  05/22/15 2030 131/58 mmHg - - 103 22 97 % - -  05/22/15 2022 (!) 133/50 mmHg - - 104 (!) 28 100 % - -  05/22/15 2015 (!) 133/50 mmHg - - 111 24 95 % - -  05/22/15 2014 - (!) 103.1 F (39.5 C) Rectal - - - - -  05/22/15 2012 - (!) 103.1 F (39.5 C) Rectal - - - 5\' 3"  (1.6 m) 171 lb (77.565 kg)  05/22/15 2000 (!) 121/44 mmHg - - 101 (!) 27 98 % - -  05/22/15 1952 (!) 128/49 mmHg - - 116 22 100 % - -  05/22/15 1945 (!) 128/49 mmHg - - 100 23 100 % - -  05/22/15 1936 (!) 117/46 mmHg - - - 21 - - -  05/22/15 1915 (!) 115/46 mmHg - - 105 26 94 % - -  05/22/15 1905 111/58 mmHg - - 99 (!) 28 100 % - -  05/22/15 1900  111/58 mmHg - - - - - - -  05/22/15 1845 121/62 mmHg - - 103 25 98 % - -  05/22/15 1828 (!) 81/43 mmHg 97.9 F (36.6 C) Oral 106 25 96 % - -   10:50 PM Reevaluation with update and discussion. After initial assessment and treatment, an updated evaluation reveals she states no better or worse at this time. Findings discussed with patient and daughter. Kathalene Sporer L   10:43 PM-Consult complete with Dr. Olevia Bowens. Patient case explained and discussed. He agrees to admit patient for further evaluation and treatment. Call ended at [place] Performed by: Richarda Blade Total critical care time: 35 minutes minutes Critical care time was exclusive of separately billable procedures and treating other patients. Critical care was necessary to treat or prevent imminent or life-threatening deterioration. Critical care was time spent personally by me on the following activities: development of treatment plan with patient and/or surrogate as well as nursing, discussions with consultants, evaluation of patient's response to treatment, examination of patient, obtaining history from patient or surrogate, ordering and performing treatments and interventions, ordering and review of laboratory studies, ordering and review of radiographic studies, pulse oximetry and re-evaluation of patient's condition.    Labs Review Labs Reviewed  COMPREHENSIVE METABOLIC PANEL - Abnormal; Notable for the following:    Sodium 134 (*)    Potassium 5.2 (*)    CO2 16 (*)    Glucose, Bld 144 (*)    BUN 97 (*)    Creatinine, Ser 6.58 (*)    Albumin 3.4 (*)    GFR calc non Af Amer 6 (*)    GFR calc Af Amer 7 (*)    All other components within normal limits  CBC WITH DIFFERENTIAL/PLATELET - Abnormal; Notable for the following:    WBC 22.2 (*)    RDW 17.0 (*)    Neutro Abs 21.1 (*)    All other components within normal limits  URINALYSIS, ROUTINE W REFLEX MICROSCOPIC (NOT AT [facility]) - Abnormal; Notable for the  following:    Color, Urine AMBER (*)    APPearance TURBID (*)    Hgb urine dipstick LARGE (*)    Protein, ur >300 (*)    Nitrite POSITIVE (*)    Leukocytes, UA LARGE (*)    All other components within normal limits  URINE MICROSCOPIC-ADD ON - Abnormal; Notable for the following:    Squamous Epithelial / LPF 6-30 (*)    Bacteria, UA MANY (*)    All other components within normal limits  CULTURE, BLOOD (ROUTINE X 2)  CULTURE, BLOOD (ROUTINE X 2)  URINE CULTURE  I-STAT CG4 LACTIC ACID, ED  I-STAT CG4 LACTIC ACID, ED   BUN  Date Value Ref Range Status  05/23/2015 99* 6 - 20 mg/dL Final    Comment:    RESULTS CONFIRMED BY MANUAL DILUTION  05/22/2015 97* 6 - 20 mg/dL Final    Comment:    RESULTS CONFIRMED BY MANUAL DILUTION  05/07/2015 39* 6 - 20 mg/dL Final  05/06/2015 39* 6 - 20 mg/dL Final   CREATININE, SER  Date Value Ref Range Status  05/23/2015 7.11* 0.44 - 1.00 mg/dL Final  05/22/2015 6.58* 0.44 - 1.00 mg/dL Final  05/07/2015 5.23* 0.44 - 1.00 mg/dL Final  05/06/2015 4.95* 0.44 - 1.00 mg/dL Final  Imaging Review Dg Chest 2 View  05/22/2015  CLINICAL DATA:  Sepsis. Shortness of breath. Chronic kidney disease. EXAM: CHEST  2 VIEW COMPARISON:  05/02/1967 chest radiograph. FINDINGS: Right internal jugular central venous catheter terminates in the lower third of the superior vena cava. Stable cardiomediastinal silhouette with normal heart size and retrocardiac lucency in keeping with known hiatal hernia. No pneumothorax. No pleural effusion. Slightly low lung volumes. No overt pulmonary edema. No focal lung opacity. Partially visualized is a left nephro ureteral stent. IMPRESSION: No active cardiopulmonary disease. Electronically Signed   By: Ilona Sorrel M.D.   On: 05/22/2015 19:52   I have personally reviewed and evaluated these images and lab results as part of my medical decision-making.   EKG Interpretation None       Date: 05/22/15  Rate: 98  Rhythm: normal  sinus rhythm  QRS Axis: right  PR and QT Intervals: normal  ST/T Wave abnormalities: normal  PR and QRS Conduction Disutrbances:none  Narrative Interpretation:   Old EKG Reviewed: changes noted - rate slower     MDM   Final diagnoses:  Urinary tract infection without hematuria, site unspecified  AKI (acute kidney injury) (HCC)  Renal insufficiency  Hypotension, unspecified hypotension type  Hyperkalemia    Febrile illness with UTI.  Recent UTIs and history of urosepsis, which led to her kidney failure by her report. Blood pressure improved with treatment here. No metabolic instability, sepsis or suggestion for impending vascular collapse.  Nursing Notes Reviewed/ Care Coordinated, and agree without changes. Applicable Imaging Reviewed.  Interpretation of Laboratory Data incorporated into ED treatment  Plan: Admit    Daleen Bo, MD 05/23/15 1404

## 2015-05-22 NOTE — ED Notes (Signed)
Patient is aware we need urine

## 2015-05-22 NOTE — ED Notes (Signed)
MD at bedside. 

## 2015-05-22 NOTE — ED Notes (Addendum)
Pt reports temperature of 100.17F at home and decreased thirst today. Had tylenol at 1700. No n/v/d, cough or sore throat. Pt did have dysuria yesterday. Pt weaker than normal and feels dehydrated. Pt being worked up by PCP for possible fistula placement for dialysis. Last dialysis 5 weeks ago.

## 2015-05-23 ENCOUNTER — Inpatient Hospital Stay (HOSPITAL_COMMUNITY): Payer: Medicare Other

## 2015-05-23 DIAGNOSIS — N390 Urinary tract infection, site not specified: Secondary | ICD-10-CM | POA: Insufficient documentation

## 2015-05-23 DIAGNOSIS — E875 Hyperkalemia: Secondary | ICD-10-CM

## 2015-05-23 DIAGNOSIS — N186 End stage renal disease: Secondary | ICD-10-CM

## 2015-05-23 LAB — COMPREHENSIVE METABOLIC PANEL
ALT: 21 U/L (ref 14–54)
AST: 22 U/L (ref 15–41)
Albumin: 2.7 g/dL — ABNORMAL LOW (ref 3.5–5.0)
Alkaline Phosphatase: 89 U/L (ref 38–126)
Anion gap: 11 (ref 5–15)
BUN: 99 mg/dL — ABNORMAL HIGH (ref 6–20)
CO2: 15 mmol/L — ABNORMAL LOW (ref 22–32)
Calcium: 8.7 mg/dL — ABNORMAL LOW (ref 8.9–10.3)
Chloride: 106 mmol/L (ref 101–111)
Creatinine, Ser: 7.11 mg/dL — ABNORMAL HIGH (ref 0.44–1.00)
GFR calc Af Amer: 6 mL/min — ABNORMAL LOW (ref >60)
GFR calc non Af Amer: 5 mL/min — ABNORMAL LOW (ref >60)
Glucose, Bld: 143 mg/dL — ABNORMAL HIGH (ref 65–99)
Potassium: 5.4 mmol/L — ABNORMAL HIGH (ref 3.5–5.1)
Sodium: 132 mmol/L — ABNORMAL LOW (ref 135–145)
Total Bilirubin: 0.5 mg/dL (ref 0.3–1.2)
Total Protein: 6.2 g/dL — ABNORMAL LOW (ref 6.5–8.1)

## 2015-05-23 LAB — CBC WITH DIFFERENTIAL/PLATELET
Basophils Absolute: 0 10*3/uL (ref 0.0–0.1)
Basophils Relative: 0 %
Eosinophils Absolute: 0 10*3/uL (ref 0.0–0.7)
Eosinophils Relative: 0 %
HCT: 34.8 % — ABNORMAL LOW (ref 36.0–46.0)
Hemoglobin: 10.9 g/dL — ABNORMAL LOW (ref 12.0–15.0)
Lymphocytes Relative: 3 %
Lymphs Abs: 0.8 10*3/uL (ref 0.7–4.0)
MCH: 28 pg (ref 26.0–34.0)
MCHC: 31.3 g/dL (ref 30.0–36.0)
MCV: 89.5 fL (ref 78.0–100.0)
Monocytes Absolute: 0.3 10*3/uL (ref 0.1–1.0)
Monocytes Relative: 1 %
Neutro Abs: 26.8 10*3/uL — ABNORMAL HIGH (ref 1.7–7.7)
Neutrophils Relative %: 96 %
Platelets: 330 10*3/uL (ref 150–400)
RBC: 3.89 MIL/uL (ref 3.87–5.11)
RDW: 17.1 % — ABNORMAL HIGH (ref 11.5–15.5)
WBC: 27.9 10*3/uL — ABNORMAL HIGH (ref 4.0–10.5)

## 2015-05-23 LAB — PROTIME-INR
INR: 1.3 (ref 0.00–1.49)
Prothrombin Time: 16.3 seconds — ABNORMAL HIGH (ref 11.6–15.2)

## 2015-05-23 LAB — BRAIN NATRIURETIC PEPTIDE: B Natriuretic Peptide: 37.2 pg/mL (ref 0.0–100.0)

## 2015-05-23 LAB — MAGNESIUM: Magnesium: 1.9 mg/dL (ref 1.7–2.4)

## 2015-05-23 MED ORDER — MIDAZOLAM HCL 5 MG/5ML IJ SOLN
INTRAMUSCULAR | Status: AC | PRN
  Administered 2015-05-23 (×2): 1 mg via INTRAVENOUS

## 2015-05-23 MED ORDER — SODIUM CHLORIDE 0.9 % IV BOLUS (SEPSIS)
1000.0000 mL | Freq: Once | INTRAVENOUS | Status: AC
  Administered 2015-05-23: 1000 mL via INTRAVENOUS

## 2015-05-23 MED ORDER — ONDANSETRON HCL 4 MG PO TABS: 4.0000 mg | ORAL_TABLET | Freq: Four times a day (QID) | ORAL | Status: DC | PRN

## 2015-05-23 MED ORDER — SODIUM CHLORIDE 0.9 % IV BOLUS (SEPSIS)
2000.0000 mL | Freq: Once | INTRAVENOUS | Status: AC
  Administered 2015-05-23: 2000 mL via INTRAVENOUS

## 2015-05-23 MED ORDER — SODIUM BICARBONATE 650 MG PO TABS
650.0000 mg | ORAL_TABLET | Freq: Two times a day (BID) | ORAL | Status: DC
  Administered 2015-05-23 – 2015-05-31 (×17): 650 mg via ORAL
  Filled 2015-05-23 (×18): qty 1

## 2015-05-23 MED ORDER — VANCOMYCIN HCL 10 G IV SOLR
1500.0000 mg | Freq: Once | INTRAVENOUS | Status: AC
  Administered 2015-05-23: 1500 mg via INTRAVENOUS
  Filled 2015-05-23: qty 1500

## 2015-05-23 MED ORDER — ONDANSETRON HCL 4 MG/2ML IJ SOLN
4.0000 mg | Freq: Four times a day (QID) | INTRAMUSCULAR | Status: DC | PRN
  Administered 2015-05-23 – 2015-05-27 (×3): 4 mg via INTRAVENOUS
  Filled 2015-05-23 (×3): qty 2

## 2015-05-23 MED ORDER — TIMOLOL MALEATE 0.5 % OP SOLN
1.0000 [drp] | Freq: Every morning | OPHTHALMIC | Status: DC
  Administered 2015-05-23 – 2015-05-31 (×9): 1 [drp] via OPHTHALMIC
  Filled 2015-05-23: qty 5

## 2015-05-23 MED ORDER — SODIUM CHLORIDE 0.9 % IV SOLN
INTRAVENOUS | Status: AC
  Administered 2015-05-23 (×2): via INTRAVENOUS

## 2015-05-23 MED ORDER — FENTANYL CITRATE (PF) 100 MCG/2ML IJ SOLN
INTRAMUSCULAR | Status: AC | PRN
  Administered 2015-05-23 (×2): 50 ug via INTRAVENOUS

## 2015-05-23 MED ORDER — MIDAZOLAM HCL 2 MG/2ML IJ SOLN
INTRAMUSCULAR | Status: AC
  Filled 2015-05-23: qty 6

## 2015-05-23 MED ORDER — SODIUM CHLORIDE 0.9 % IV SOLN
INTRAVENOUS | Status: DC
  Administered 2015-05-23: 21:00:00 via INTRAVENOUS

## 2015-05-23 MED ORDER — ENOXAPARIN SODIUM 30 MG/0.3ML ~~LOC~~ SOLN
30.0000 mg | SUBCUTANEOUS | Status: DC
  Filled 2015-05-23: qty 0.3

## 2015-05-23 MED ORDER — ACETAMINOPHEN 325 MG PO TABS
650.0000 mg | ORAL_TABLET | Freq: Four times a day (QID) | ORAL | Status: DC | PRN
  Administered 2015-05-23 – 2015-05-30 (×12): 650 mg via ORAL
  Filled 2015-05-23 (×12): qty 2

## 2015-05-23 MED ORDER — PIPERACILLIN-TAZOBACTAM IN DEX 2-0.25 GM/50ML IV SOLN
2.2500 g | Freq: Three times a day (TID) | INTRAVENOUS | Status: DC
  Administered 2015-05-23 – 2015-05-26 (×9): 2.25 g via INTRAVENOUS
  Filled 2015-05-23 (×10): qty 50

## 2015-05-23 MED ORDER — FENTANYL CITRATE (PF) 100 MCG/2ML IJ SOLN
INTRAMUSCULAR | Status: AC
  Filled 2015-05-23: qty 6

## 2015-05-23 MED ORDER — SODIUM CHLORIDE 0.9 % IJ SOLN
3.0000 mL | Freq: Two times a day (BID) | INTRAMUSCULAR | Status: DC
Start: 2015-05-23 — End: 2015-05-31
  Administered 2015-05-23 – 2015-05-30 (×10): 3 mL via INTRAVENOUS

## 2015-05-23 MED ORDER — SODIUM CHLORIDE 0.9 % IV BOLUS (SEPSIS)
250.0000 mL | Freq: Once | INTRAVENOUS | Status: AC
  Administered 2015-05-23: 250 mL via INTRAVENOUS

## 2015-05-23 MED ORDER — FAMOTIDINE 20 MG PO TABS
20.0000 mg | ORAL_TABLET | Freq: Two times a day (BID) | ORAL | Status: DC
  Administered 2015-05-23 – 2015-05-24 (×4): 20 mg via ORAL
  Filled 2015-05-23 (×5): qty 1

## 2015-05-23 MED ORDER — KETOROLAC TROMETHAMINE 30 MG/ML IJ SOLN
15.0000 mg | Freq: Once | INTRAMUSCULAR | Status: AC
  Administered 2015-05-23: 15 mg via INTRAVENOUS
  Filled 2015-05-23: qty 1

## 2015-05-23 MED ORDER — LATANOPROST 0.005 % OP SOLN
1.0000 [drp] | Freq: Every day | OPHTHALMIC | Status: DC
  Administered 2015-05-23 – 2015-05-30 (×9): 1 [drp] via OPHTHALMIC
  Filled 2015-05-23: qty 2.5

## 2015-05-23 MED ORDER — LIDOCAINE HCL 1 % IJ SOLN
INTRAMUSCULAR | Status: AC
  Filled 2015-05-23: qty 20

## 2015-05-23 MED ORDER — DEXTROSE 5 % IV SOLN: 1.0000 g | INTRAVENOUS | Status: DC

## 2015-05-23 MED ORDER — SODIUM CHLORIDE 0.9 % IV SOLN: INTRAVENOUS | Status: AC

## 2015-05-23 MED ORDER — PANTOPRAZOLE SODIUM 40 MG PO TBEC
40.0000 mg | DELAYED_RELEASE_TABLET | Freq: Every day | ORAL | Status: DC
  Administered 2015-05-23 – 2015-05-24 (×3): 40 mg via ORAL
  Filled 2015-05-23 (×3): qty 1

## 2015-05-23 MED ORDER — IOHEXOL 300 MG/ML  SOLN
5.0000 mL | Freq: Once | INTRAMUSCULAR | Status: AC | PRN
Start: 2015-05-23 — End: 2015-05-23
  Administered 2015-05-23: 5 mL

## 2015-05-23 MED ORDER — NEPRO/CARBSTEADY PO LIQD
237.0000 mL | ORAL | Status: DC
  Administered 2015-05-23 – 2015-05-24 (×2): 237 mL via ORAL
  Filled 2015-05-23 (×3): qty 237

## 2015-05-23 NOTE — H&P (Signed)
[facility] History and Physical  [name] [id number] DOB: [birth year] DOA: 05/22/2015  Referring physician:  PCP: [name], MD   Chief Complaint:   HPI: [name] is a 69 y.o. female with her past medical history of nephrolithiasis, bilateral hydronephrosis, status post nephrostomy with reversal of nephrostomy last month, chronic kidney disease, paroxysmal atrial fibrillation, CAD, hypertension, hyperlipidemia, GERD, glaucoma comes to the emergency department with a day history of fever, chills, fatigue, decreased oral intake, mild right flank pain, dysuria since yesterday. She also complains of nocturia over up to 10 times since her nephrostomy was reversed.   She denies headache, sore throat, cough, dyspnea, chest pain, nausea, emesis, diarrhea, melena or hematochezia. She states that she feels weaker than normal and feels dehydrated.  Workup in the ER is significant for leukocytosis of 22,000 and pyuria/bacteriuria on urinalysis.   Review of Systems:  Constitutional:  Positive Fevers, chills, fatigue.  No weight loss, night sweats.   HEENT:  No headaches, Difficulty swallowing,Tooth/dental problems,Sore throat,  No sneezing, itching, ear ache, nasal congestion, post nasal drip,  Cardio-vascular:  No chest pain, Orthopnea, PND, swelling in lower extremities, anasarca, dizziness, palpitations  GI:  No heartburn, indigestion, abdominal pain, nausea, vomiting, diarrhea, change in bowel habits, loss of appetite  Resp:  No shortness of breath with exertion or at rest. No excess mucus, no productive cough, No non-productive cough, No coughing up of blood.No change in color of mucus.No wheezing.No chest wall deformity  Skin:  no rash or lesions.  GU:  Mild dysuria, positive change in color of urine, urgency and frequency. Mild right flank pain.  Musculoskeletal:  No joint pain or swelling. No decreased range of motion. No back pain.  Psych:  No  change in mood or affect. No depression or anxiety. No memory loss.   Past Medical History  Diagnosis Date  . GERD (gastroesophageal reflux disease)   . Glaucoma   . H/O renal calculi 2002 & 2006  . H/O hiatal hernia   . Headache(784.0)     migraine  . Pneumonia     dx 10-06-2014 per CXR--  on 10-27-2014 pt states finished antibiotic and denies cough or fever  . Hyperlipidemia   . History of MI (myocardial infarction)     10/ 2013 in setting of Septic Shock  . History of atrial fibrillation     10/ 2013  in setting of Septic Shock  . History of CHF (congestive heart failure)     10/ 2013 in setting of septic shock  . Sigmoid diverticulosis   . Nephrolithiasis     bilateral  . CKD (chronic kidney disease), stage III     nephrologist-  dr Florene Glen  . Right ureteral stone   . History of acute respiratory failure     10/ 2013  -- ventilated in setting septic shock  . Dysrhythmia     Afib in 2013 when she had septic shock related to stone ureteral obstruction  . Myocardial infarction [facility])     Was reported in 2013 during hospitalization with septic shock  . Septic shock ([place]) 04/04/2012  . Sepsis ([place])   . Complication of anesthesia     use a little anesthesia , per patient MD states she quit breathing   . Hypertension     medication removed from regimen due to low blood pressure   . Peripheral vascular disease ([place])   . S/P hemodialysis catheter insertion ([place]) 04/11/2015     right anterior  chest , only used once   . History of nephrostomy 04/11/2015     currently inplace 04/28/2015   . History of blood transfusion 04/13/2015    Past Surgical History  Procedure Laterality Date  . Cystoscopy w/ ureteral stent placement  04/04/2012    Procedure: CYSTOSCOPY WITH RETROGRADE PYELOGRAM/URETERAL STENT PLACEMENT;  Surgeon: Ailene Rud, MD;  Location: [place];  Service: Urology;  Laterality: Left;  . Total abdominal hysterectomy w/ bilateral salpingoophorectomy  1993     secondary to fibroids  . Breast biopsy Left 08/23/07    benign fibrocystic with duct ectasia  . Cardiac catheterization  07-11-2012  dr Irish Lack    Abnormal stress test/   normal coronary arteries/  LVEDP  67mmHg  . Cardiovascular stress test  06-26-2012  dr Irish Lack    marked ischemia in the basal anterior, mid anterior, apical inferior regions/  normal LVF, ef 63%  . Transthoracic echocardiogram  04-09-2012    normal LVF,  ef 60-65%,  mild LAE,  mild TR, trivial MR and PR  . Cataract extraction w/ intraocular lens  implant, bilateral    . Knee arthroscopy Left 02-14-2003  . Laparoscopic cholecystectomy  03-23-2005  . Extracorporeal shock wave lithotripsy  05-28-2012  &  10-08-2012  . Cystoscopy with stent placement Right 10/28/2014    Procedure: RIGHT URETERAL STENT PLACEMENT;  Surgeon: Irine Seal, MD;  Location: [facility];  Service: Urology;  Laterality: Right;  . Cystoscopy/retrograde/ureteroscopy/stone extraction with basket Right 11/21/2014    Procedure: CYSTOSCOPY/RIGHT RETROGRADE PYELOGRAM/RIGHT URETEROSCOPY/BASKET EXTRACTION/RIGHT PYELOSCOPY/LASER OF STONE/RIGHT DOUBLE J STENT;  Surgeon: Carolan Clines, MD;  Location: [place];  Service: Urology;  Laterality: Right;  . Holmium laser application Right [id number]    Procedure: HOLMIUM LASER APPLICATION;  Surgeon: Carolan Clines, MD;  Location[facility];  Service: Urology;  Laterality: Right;  . Cystoscopy with stent placement Right 02/26/2015    Procedure: CYSTOSCOPY RETROGRADE PYELOGRAM WITH STENT PLACEMENT;  Surgeon: Cleon Gustin, MD;  Location: [facility];  Service: Urology;  Laterality: Right;  . Cystoscopy w/ ureteral stent placement Bilateral 05/04/2015    Procedure: CYSTOSCOPY WITH BILATERAL RETROGRADE PYELOGRAM/ WITH INTERPRETATION, EXCHANGE OF RIGHT URETERAL STENT REPLACEMENT AND PLACEMENT LEFT URETERAL STENT PLACEMENT EXAMINATION OF VAGINA;  Surgeon: Carolan Clines, MD;   Location: [facility];  Service: Urology;  Laterality: Bilateral;   Social History:  reports that she has never smoked. She has never used smokeless tobacco. She reports that she does not drink alcohol or use illicit drugs.  Allergies  Allergen Reactions  . Vicodin [Hydrocodone-Acetaminophen] Nausea And Vomiting  . Percocet [Oxycodone-Acetaminophen] Nausea And Vomiting    Family History  Problem Relation Age of Onset  . Hypertension Mother   . Cancer Mother 33    breast  . Dementia Mother   . Hypertension Brother   . Diabetes Brother   . Cancer Father 67    pancreatic  . Heart failure Paternal Grandmother   . Bladder Cancer Maternal Grandfather     Prior to Admission medications   Medication Sig Start Date End Date Taking? Authorizing Provider  acetaminophen (TYLENOL) 500 MG tablet Take 500 mg by mouth every 6 (six) hours as needed for moderate pain.   Yes Historical Provider, MD  acidophilus (RISAQUAD) CAPS capsule Take 2 capsules by mouth daily. 05/07/15  Yes Maryann Mikhail, DO  bimatoprost (LUMIGAN) 0.01 % SOLN Place 1 drop into both eyes at bedtime.   Yes Historical Provider, MD  ENSURE PLUS (  ENSURE PLUS) LIQD Take 237 mLs by mouth daily as needed (nutritional supplement).    Yes Historical Provider, MD  Nutritional Supplements (FEEDING SUPPLEMENT, NEPRO CARB STEADY,) LIQD Take 237 mLs by mouth daily. 05/07/15  Yes Maryann Mikhail, DO  ranitidine (ZANTAC) 150 MG tablet Take 150 mg by mouth 2 (two) times daily as needed for heartburn.   Yes Historical Provider, MD  timolol (BETIMOL) 0.5 % ophthalmic solution Place 1 drop into both eyes every morning.    Yes Historical Provider, MD  amoxicillin-clavulanate (AUGMENTIN) 250-125 MG tablet Take 1 tablet by mouth 2 (two) times daily. Patient not taking: Reported on 05/20/2015 05/07/15   Velta Addison Mikhail, DO  HYDROcodone-acetaminophen (NORCO/VICODIN) 5-325 MG tablet Take 1 tablet by mouth 4 (four) times daily as needed for moderate pain.   04/27/15   Historical Provider, MD  saccharomyces boulardii (FLORASTOR) 250 MG capsule Take 1 capsule (250 mg total) by mouth 2 (two) times daily. Patient not taking: Reported on 05/22/2015 05/07/15   Velta Addison Mikhail, DO  sodium chloride (OCEAN) 0.65 % SOLN nasal spray Place 1 spray into both nostrils 3 (three) times daily as needed for congestion.    Historical Provider, MD   Physical Exam: Filed Vitals:   05/22/15 2245 05/22/15 2300 05/22/15 2315 05/22/15 2340  BP: 112/84 94/50 102/58   Pulse:  102 97   Temp:    97.5 F (36.4 C)  TempSrc:    Oral  Resp: 27 23 21    Height:      Weight:      SpO2:  96% 95%     Wt Readings from Last 3 Encounters:  05/22/15 77.565 kg (171 lb)  05/22/15 77.565 kg (171 lb)  05/20/15 77.111 kg (170 lb)    General:  Appears calm and comfortable Eyes: PERRL, normal lids, irises & conjunctiva ENT: grossly normal hearing, lips, tongue and oral mucosae are dry Neck: no LAD, masses or thyromegaly Cardiovascular: Regular rhythm, tachycardia at 100 bpm, no murmurs. No LE edema. Telemetry: Sinus tachycardia at 100 bpm, no murmurs. No lower extremity edema. Respiratory: CTA bilaterally, no w/r/r. Normal respiratory effort. Abdomen: Bowel sounds positive, right flank dressing in place, soft, ntnd, mild right CVA. Skin: no rash or induration seen on limited exam Musculoskeletal: grossly normal tone BUE/BLE Psychiatric: grossly normal mood and affect, speech fluent and appropriate Neurologic: grossly non-focal.          Labs on Admission:  Basic Metabolic Panel:  Recent Labs Lab 05/22/15 1857  NA 134*  K 5.2*  CL 105  CO2 16*  GLUCOSE 144*  BUN 97*  CREATININE 6.58*  CALCIUM 9.6   Liver Function Tests:  Recent Labs Lab 05/22/15 1857  AST 21  ALT 19  ALKPHOS 107  BILITOT 0.7  PROT 7.5  ALBUMIN 3.4*   CBC:  Recent Labs Lab 05/22/15 0947 05/22/15 1857  WBC  --  22.2*  NEUTROABS  --  21.1*  HGB 11.1* 12.9  HCT  --  41.4  MCV  --   90.2  PLT  --  367    Radiological Exams on Admission: Dg Chest 2 View  05/22/2015  CLINICAL DATA:  Sepsis. Shortness of breath. Chronic kidney disease. EXAM: CHEST  2 VIEW COMPARISON:  05/02/1967 chest radiograph. FINDINGS: Right internal jugular central venous catheter terminates in the lower third of the superior vena cava. Stable cardiomediastinal silhouette with normal heart size and retrocardiac lucency in keeping with known hiatal hernia. No pneumothorax. No pleural effusion. Slightly low lung volumes.  No overt pulmonary edema. No focal lung opacity. Partially visualized is a left nephro ureteral stent. IMPRESSION: No active cardiopulmonary disease. Electronically Signed   By: Ilona Sorrel M.D.   On: 05/22/2015 19:52    EKG: Independently reviewed.  Vent. rate 99 BPM PR interval 151 ms QRS duration 90 ms QT/QTc 324/416 ms P-R-T axes 67 99 33 Sinus rhythm Right axis deviation  Echocardiogram June/2016 ------------------------------------------------------------------- LV EF: 65% -  70%  ------------------------------------------------------------------- Indications:   CHF - 428.0. Fever 780.6.  ------------------------------------------------------------------- History:  PMH: sepsis. Atrial fibrillation. PMH:  Myocardial infarction. Risk factors: Hypertension. Dyslipidemia.  ------------------------------------------------------------------- Study Conclusions  - Left ventricle: The cavity size was normal. Wall thickness was increased in a pattern of mild LVH. Systolic function was vigorous. The estimated ejection fraction was in the range of 65% to 70%. Wall motion was normal; there were no regional wall motion abnormalities. - Left atrium: The atrium was mildly dilated.  Assessment/Plan Principal Problem:   Acute pyelonephritis Admit to telemetry. Normal saline 1 L bolus given. Continue IV antibiotics. Continue time-limited, gentle and careful  IV hydration. Follow-up blood cultures and sensitivity. Follow-up urine culture and sensitivity.  Active Problems:   Glaucoma Continue Lumigan drops. Continue Betimol drops.    Essential hypertension, benign Monitor blood pressure.  Consider resuming therapy if blood pressure increases while the patient is in the hospital.    CAD (coronary artery disease) As symptomatic at this time. Continue cardiac telemetry. Continue supplemental oxygen.    Acute-on-chronic kidney injury ([place]) Continue IV hydration as above mentioned. Monitor electrolytes, BUN and creatinine.       Code Status: full code. DVT Prophylaxis: Lovenox SQ. Family Communication: her daughter was at bedside. Disposition Plan: admit for IV hydration and IV antibiotic therapy.  Time spent: over 70 minutes were used in the process of this admission.  Reubin Milan [facility] Pager [phone].

## 2015-05-23 NOTE — Progress Notes (Signed)
Pt arrived to unit room 1504. Alert and oriented x4. VS taken, pt guide at bedside, callbell within reach with no complications. Headache pain 7/10, daughter at bedside. Will continue to monitor and intervene appropriately.

## 2015-05-23 NOTE — Progress Notes (Signed)
Pt left at this time with transporter headed to IR for procedure. Daughter remains in room.

## 2015-05-23 NOTE — Consult Note (Signed)
Urology Consult  Referring physician: Tennis Must, MD Reason for referral: Urosepsis and acute on chronic kidney disease  Chief Complaint: Urosepsis and acute on chronic kidney disease  History of Present Illness:  [name] is a 69 y.o. female with her past medical history of recurrent nephrolithiasis, intermittent bilateral hydronephrosis managed intermittently with bilateral ureteral stents, chronic kidney disease who was admitted in 03/2015 with urosepsis, acute on chronic CKD and recurrent right hydronephrosis. She underwent nephrostomy tube placement during this acute episode. Her nephrostomy tube was eventually internalized to a right indwelling ureteral stent and the nephrostomy tube was removed on 05/06/15.  She presented the ER overnight with recurrent sepsis of urinary origin and recurrent acute on chronic CKD with a Cr of 7.   She reports that she developed fever, chills, fatigue, decreased oral intake, mild right flank pain, dysuria since yesterday. She also complains of nocturia over up to 10 times since her nephrostomy was reversed.   She underwent a renal ultrasound today which demonstrates recurrent moderate right hydronephrosis with ureteral stent present within the renal pelvis and bladder ultrasonically. The left kidney had mild fullness.   Past Medical History  Diagnosis Date  . GERD (gastroesophageal reflux disease)   . Glaucoma   . H/O renal calculi 2002 & 2006  . H/O hiatal hernia   . Headache(784.0)     migraine  . Pneumonia     dx 10-06-2014 per CXR--  on 10-27-2014 pt states finished antibiotic and denies cough or fever  . Hyperlipidemia   . History of MI (myocardial infarction)     10/ 2013 in setting of Septic Shock  . History of atrial fibrillation     10/ 2013  in setting of Septic Shock  . History of CHF (congestive heart failure)     10/ 2013 in setting of septic shock  . Sigmoid diverticulosis   . Nephrolithiasis     bilateral  . CKD (chronic  kidney disease), stage III     nephrologist-  dr Florene Glen  . Right ureteral stone   . History of acute respiratory failure     10/ 2013  -- ventilated in setting septic shock  . Dysrhythmia     Afib in 2013 when she had septic shock related to stone ureteral obstruction  . Myocardial infarction [facility])     Was reported in 2013 during hospitalization with septic shock  . Septic shock ([place]) 04/04/2012  . Sepsis ([place])   . Complication of anesthesia     use a little anesthesia , per patient MD states she quit breathing   . Hypertension     medication removed from regimen due to low blood pressure   . Peripheral vascular disease ([place])   . S/P hemodialysis catheter insertion ([place]) 04/11/2015     right anterior chest , only used once   . History of nephrostomy 04/11/2015     currently inplace 04/28/2015   . History of blood transfusion 04/13/2015    Past Surgical History  Procedure Laterality Date  . Cystoscopy w/ ureteral stent placement  04/04/2012    Procedure: CYSTOSCOPY WITH RETROGRADE PYELOGRAM/URETERAL STENT PLACEMENT;  Surgeon: Ailene Rud, MD;  Location: [place];  Service: Urology;  Laterality: Left;  . Total abdominal hysterectomy w/ bilateral salpingoophorectomy  1993    secondary to fibroids  . Breast biopsy Left 08/23/07    benign fibrocystic with duct ectasia  . Cardiac catheterization  07-11-2012  dr Irish Lack    Abnormal stress test/  normal coronary arteries/  LVEDP  53mHg  . Cardiovascular stress test  06-26-2012  dr vIrish Lack   marked ischemia in the basal anterior, mid anterior, apical inferior regions/  normal LVF, ef 63%  . Transthoracic echocardiogram  04-09-2012    normal LVF,  ef 60-65%,  mild LAE,  mild TR, trivial MR and PR  . Cataract extraction w/ intraocular lens  implant, bilateral    . Knee arthroscopy Left 02-14-2003  . Laparoscopic cholecystectomy  03-23-2005  . Extracorporeal shock wave lithotripsy  05-28-2012  &  10-08-2012  . Cystoscopy with  stent placement Right 10/28/2014    Procedure: RIGHT URETERAL STENT PLACEMENT;  Surgeon: JIrine Seal MD;  [facility]  Service: Urology;  Laterality: Right;  . Cystoscopy/retrograde/ureteroscopy/stone extraction with basket Right 11/21/2014    Procedure: CYSTOSCOPY/RIGHT RETROGRADE PYELOGRAM/RIGHT URETEROSCOPY/BASKET EXTRACTION/RIGHT PYELOSCOPY/LASER OF STONE/RIGHT DOUBLE J STENT;  Surgeon: SCarolan Clines MD;  [place]  Service: Urology;  Laterality: Right;  . Holmium laser application Right 63/01/7563   Procedure: HOLMIUM LASER APPLICATION;  Surgeon: SCarolan Clines MD;  [facility]  Service: Urology;  Laterality: Right;  . Cystoscopy with stent placement Right 02/26/2015    Procedure: CYSTOSCOPY RETROGRADE PYELOGRAM WITH STENT PLACEMENT;  Surgeon: PCleon Gustin MD;  [facility];  Service: Urology;  Laterality: Right;  . Cystoscopy w/ ureteral stent placement Bilateral 05/04/2015    Procedure: CYSTOSCOPY WITH BILATERAL RETROGRADE PYELOGRAM/ WITH INTERPRETATION, EXCHANGE OF RIGHT URETERAL STENT REPLACEMENT AND PLACEMENT LEFT URETERAL STENT PLACEMENT EXAMINATION OF VAGINA;  Surgeon: SCarolan Clines MD;  [facility];  Service: Urology;  Laterality: Bilateral;    Medications: I have reviewed the patient's current medications. Allergies:  Allergies  Allergen Reactions  . Vicodin [Hydrocodone-Acetaminophen] Nausea And Vomiting  . Percocet [Oxycodone-Acetaminophen] Nausea And Vomiting    Family History  Problem Relation Age of Onset  . Hypertension Mother   . Cancer Mother 859   breast  . Dementia Mother   . Hypertension Brother   . Diabetes Brother   . Cancer Father 553   pancreatic  . Heart failure Paternal Grandmother   . Bladder Cancer Maternal Grandfather    Social History:  reports that she has never smoked. She has never used smokeless tobacco. She reports that she does not drink  alcohol or use illicit drugs.  Review of Systems  Constitutional: Positive for fever and chills.  Respiratory: Negative for cough.   Cardiovascular: Negative for chest pain.  Genitourinary: Positive for dysuria, urgency, frequency and flank pain.  Neurological: Positive for weakness. Negative for dizziness.    Physical Exam:  Vital signs in last 24 hours: Temp:  [97.4 F (36.3 C)-103.1 F (39.5 C)] 97.7 F (36.5 C) (12/03 0524) Pulse Rate:  [78-119] 78 (12/03 0524) Resp:  [16-28] 18 (12/03 0524) BP: (81-133)/(43-84) 106/60 mmHg (12/03 0524) SpO2:  [94 %-100 %] 98 % (12/03 0524) Weight:  [77.565 kg (171 lb)] 77.565 kg (171 lb) (12/02 2012) Physical Exam  Constitutional: She is oriented to person, place, and time. She appears distressed.  HENT:  Head: Normocephalic and atraumatic.  Cardiovascular: Normal rate and regular rhythm.   Respiratory: No respiratory distress.  GI: She exhibits no distension. There is tenderness.  + right CVA tenderness  Neurological: She is alert and oriented to person, place, and time.  Skin: Skin is warm. She is diaphoretic.    Laboratory Data:  Results for orders placed or performed during the  hospital encounter of 05/22/15 (from the past 72 hour(s))  Comprehensive metabolic panel     Status: Abnormal   Collection Time: 05/22/15  6:57 PM  Result Value Ref Range   Sodium 134 (L) 135 - 145 mmol/L   Potassium 5.2 (H) 3.5 - 5.1 mmol/L   Chloride 105 101 - 111 mmol/L   CO2 16 (L) 22 - 32 mmol/L   Glucose, Bld 144 (H) 65 - 99 mg/dL   BUN 97 (H) 6 - 20 mg/dL    Comment: RESULTS CONFIRMED BY MANUAL DILUTION   Creatinine, Ser 6.58 (H) 0.44 - 1.00 mg/dL   Calcium 9.6 8.9 - 10.3 mg/dL   Total Protein 7.5 6.5 - 8.1 g/dL   Albumin 3.4 (L) 3.5 - 5.0 g/dL   AST 21 15 - 41 U/L   ALT 19 14 - 54 U/L   Alkaline Phosphatase 107 38 - 126 U/L   Total Bilirubin 0.7 0.3 - 1.2 mg/dL   GFR calc non Af Amer 6 (L) >60 mL/min   GFR calc Af Amer 7 (L) >60 mL/min     Comment: (NOTE) The eGFR has been calculated using the CKD EPI equation. This calculation has not been validated in all clinical situations. eGFR's persistently <60 mL/min signify possible Chronic Kidney Disease.    Anion gap 13 5 - 15  CBC with Differential     Status: Abnormal   Collection Time: 05/22/15  6:57 PM  Result Value Ref Range   WBC 22.2 (H) 4.0 - 10.5 K/uL   RBC 4.59 3.87 - 5.11 MIL/uL   Hemoglobin 12.9 12.0 - 15.0 g/dL   HCT 41.4 36.0 - 46.0 %   MCV 90.2 78.0 - 100.0 fL   MCH 28.1 26.0 - 34.0 pg   MCHC 31.2 30.0 - 36.0 g/dL   RDW 17.0 (H) 11.5 - 15.5 %   Platelets 367 150 - 400 K/uL   Neutrophils Relative % 95 %   Neutro Abs 21.1 (H) 1.7 - 7.7 K/uL   Lymphocytes Relative 3 %   Lymphs Abs 0.7 0.7 - 4.0 K/uL   Monocytes Relative 2 %   Monocytes Absolute 0.4 0.1 - 1.0 K/uL   Eosinophils Relative 0 %   Eosinophils Absolute 0.0 0.0 - 0.7 K/uL   Basophils Relative 0 %   Basophils Absolute 0.0 0.0 - 0.1 K/uL  I-Stat CG4 Lactic Acid, ED (Not at [facility])     Status: None   Collection Time: 05/22/15  7:07 PM  Result Value Ref Range   Lactic Acid, Venous 1.82 0.5 - 2.0 mmol/L  Urinalysis, Routine w reflex microscopic (not at [facility])     Status: Abnormal   Collection Time: 05/22/15  7:59 PM  Result Value Ref Range   Color, Urine AMBER (A) YELLOW    Comment: BIOCHEMICALS MAY BE AFFECTED BY COLOR   APPearance TURBID (A) CLEAR   Specific Gravity, Urine 1.014 1.005 - 1.030   pH 6.0 5.0 - 8.0   Glucose, UA NEGATIVE NEGATIVE mg/dL   Hgb urine dipstick LARGE (A) NEGATIVE   Bilirubin Urine NEGATIVE NEGATIVE   Ketones, ur NEGATIVE NEGATIVE mg/dL   Protein, ur >300 (A) NEGATIVE mg/dL   Nitrite POSITIVE (A) NEGATIVE   Leukocytes, UA LARGE (A) NEGATIVE  Urine microscopic-add on     Status: Abnormal   Collection Time: 05/22/15  7:59 PM  Result Value Ref Range   Squamous Epithelial / LPF 6-30 (A) NONE SEEN   WBC, UA TOO NUMEROUS TO COUNT 0 -  5 WBC/hpf   RBC / HPF TOO NUMEROUS TO  COUNT 0 - 5 RBC/hpf   Bacteria, UA MANY (A) NONE SEEN  I-Stat CG4 Lactic Acid, ED (Not at [facility])     Status: None   Collection Time: 05/22/15 10:14 PM  Result Value Ref Range   Lactic Acid, Venous 0.90 0.5 - 2.0 mmol/L  Magnesium     Status: None   Collection Time: 05/23/15  6:02 AM  Result Value Ref Range   Magnesium 1.9 1.7 - 2.4 mg/dL  Brain natriuretic peptide     Status: None   Collection Time: 05/23/15  6:02 AM  Result Value Ref Range   B Natriuretic Peptide 37.2 0.0 - 100.0 pg/mL  CBC WITH DIFFERENTIAL     Status: Abnormal   Collection Time: 05/23/15  6:02 AM  Result Value Ref Range   WBC 27.9 (H) 4.0 - 10.5 K/uL   RBC 3.89 3.87 - 5.11 MIL/uL   Hemoglobin 10.9 (L) 12.0 - 15.0 g/dL   HCT 34.8 (L) 36.0 - 46.0 %   MCV 89.5 78.0 - 100.0 fL   MCH 28.0 26.0 - 34.0 pg   MCHC 31.3 30.0 - 36.0 g/dL   RDW 17.1 (H) 11.5 - 15.5 %   Platelets 330 150 - 400 K/uL   Neutrophils Relative % 96 %   Lymphocytes Relative 3 %   Monocytes Relative 1 %   Eosinophils Relative 0 %   Basophils Relative 0 %   Neutro Abs 26.8 (H) 1.7 - 7.7 K/uL   Lymphs Abs 0.8 0.7 - 4.0 K/uL   Monocytes Absolute 0.3 0.1 - 1.0 K/uL   Eosinophils Absolute 0.0 0.0 - 0.7 K/uL   Basophils Absolute 0.0 0.0 - 0.1 K/uL   Smear Review MORPHOLOGY UNREMARKABLE   Comprehensive metabolic panel     Status: Abnormal   Collection Time: 05/23/15  6:02 AM  Result Value Ref Range   Sodium 132 (L) 135 - 145 mmol/L   Potassium 5.4 (H) 3.5 - 5.1 mmol/L   Chloride 106 101 - 111 mmol/L   CO2 15 (L) 22 - 32 mmol/L   Glucose, Bld 143 (H) 65 - 99 mg/dL   BUN 99 (H) 6 - 20 mg/dL    Comment: RESULTS CONFIRMED BY MANUAL DILUTION   Creatinine, Ser 7.11 (H) 0.44 - 1.00 mg/dL   Calcium 8.7 (L) 8.9 - 10.3 mg/dL   Total Protein 6.2 (L) 6.5 - 8.1 g/dL   Albumin 2.7 (L) 3.5 - 5.0 g/dL   AST 22 15 - 41 U/L   ALT 21 14 - 54 U/L   Alkaline Phosphatase 89 38 - 126 U/L   Total Bilirubin 0.5 0.3 - 1.2 mg/dL   GFR calc non Af Amer 5 (L) >60  mL/min   GFR calc Af Amer 6 (L) >60 mL/min    Comment: (NOTE) The eGFR has been calculated using the CKD EPI equation. This calculation has not been validated in all clinical situations. eGFR's persistently <60 mL/min signify possible Chronic Kidney Disease.    Anion gap 11 5 - 15   No results found for this or any previous visit (from the past 240 hour(s)). Creatinine:  Recent Labs  05/22/15 1857 05/23/15 0602  CREATININE 6.58* 7.11*    Impression/Assessment:  69 yo F presenting with recurrent urosepsis with evidence of persistent hydronephrosis despite ureteral stent present in the correct position based on ultrasound. This is concerning for ureteral stent failure only 2 weeks after placement. Given her  urosepsis, severe AKI and very short interval to to stent failure we do not feel that ureteral stent exchange is likely to be successful at decompressing her and draining the infected urine from the right kidney.  Plan:  1. Recommend emergent placement of right nephrostomy tube 2. Recommend placement of foley catheter for complete decompression of collecting system given urosepsis 3. Right ureteral stent can be removed antegrade by interventional radiology in combination with an antegrade nephrostogram to assess ureteral obstruction after she has recovered from her urosepsis 4. Recommend continued treatment of urosepsis with broad spectrum antibiotics and supportive care  5. Make patient NPO now 6. Hold any anticoagulation 7. Send coags now   I have evaluated the patient with Dr. Frederico Hamman and agree with the above plan Acie Fredrickson 05/23/2015, 9:33 AM

## 2015-05-23 NOTE — Consult Note (Signed)
Renal Service Consult Note [facility]  [name] 05/23/2015 [name] Requesting Physician: Dr Clementeen Graham  Reason for Consult:  CKD 4 patient with acute on CRF HPI: The patient is a 69 y.o. year-old with hx of kidney stones, CKD stage 4, HTN, Hl, MI who presented to ED 12/2 with fever, chills, dec'd po intake.  +dysuria, weak, dehydrated.  Has CKD and required temporary dialysis on hosp admit 5 wks ago.  Still has HD cath in but is not getting regular HD now.  Baseline creat is 4.5 - 5.0.  Is due to have AVF placed next week by [facility].   Patient was in hospital Oct 2016 with R hydro/ UTI/ pyelo and sepsis.  Got some dialysis, had perc neph tube on 10/22 then internal R JJ stent on more recent visit 05/06/15 with removal of the perc neph tube.  She had been doing well until the last day or two when she developed the above symptoms.  Creat was 7.1 on admission. Pt was admitted and getting IVF's.  Renal US showed new worsening of R hydro consistent with possible stent malfunction/ occlusion.  Patient is feeling better today on IVF and abx.    Denies any CP, SOB, cough, abd pain today.  No n/v/d. Appetite so-so.  No HA, blurred vision, jerking, seizures or paralysis.   Past Medical History  Past Medical History  Diagnosis Date  . GERD (gastroesophageal reflux disease)   . Glaucoma   . H/O renal calculi 2002 & 2006  . H/O hiatal hernia   . Headache(784.0)     migraine  . Pneumonia     dx 10-06-2014 per CXR--  on 10-27-2014 pt states finished antibiotic and denies cough or fever  . Hyperlipidemia   . History of MI (myocardial infarction)     10/ 2013 in setting of Septic Shock  . History of atrial fibrillation     10/ 2013  in setting of Septic Shock  . History of CHF (congestive heart failure)     10/ 2013 in setting of septic shock  . Sigmoid diverticulosis   . Nephrolithiasis     bilateral  . CKD (chronic kidney disease), stage III     nephrologist-  dr Florene Glen   . Right ureteral stone   . History of acute respiratory failure     10/ 2013  -- ventilated in setting septic shock  . Dysrhythmia     Afib in 2013 when she had septic shock related to stone ureteral obstruction  . Myocardial infarction [facility])     Was reported in 2013 during hospitalization with septic shock  . Septic shock ([place]) 04/04/2012  . Sepsis ([place])   . Complication of anesthesia     use a little anesthesia , per patient MD states she quit breathing   . Hypertension     medication removed from regimen due to low blood pressure   . Peripheral vascular disease ([place])   . S/P hemodialysis catheter insertion ([place]) 04/11/2015     right anterior chest , only used once   . History of nephrostomy 04/11/2015     currently inplace 04/28/2015   . History of blood transfusion 04/13/2015    Past Surgical History  Past Surgical History  Procedure Laterality Date  . Cystoscopy w/ ureteral stent placement  04/04/2012    Procedure: CYSTOSCOPY WITH RETROGRADE PYELOGRAM/URETERAL STENT PLACEMENT;  Surgeon: Ailene Rud, MD;  Location: [place];  Service: Urology;  Laterality: Left;  .  Total abdominal hysterectomy w/ bilateral salpingoophorectomy  1993    secondary to fibroids  . Breast biopsy Left 08/23/07    benign fibrocystic with duct ectasia  . Cardiac catheterization  07-11-2012  dr Irish Lack    Abnormal stress test/   normal coronary arteries/  LVEDP  87mmHg  . Cardiovascular stress test  06-26-2012  dr Irish Lack    marked ischemia in the basal anterior, mid anterior, apical inferior regions/  normal LVF, ef 63%  . Transthoracic echocardiogram  04-09-2012    normal LVF,  ef 60-65%,  mild LAE,  mild TR, trivial MR and PR  . Cataract extraction w/ intraocular lens  implant, bilateral    . Knee arthroscopy Left 02-14-2003  . Laparoscopic cholecystectomy  03-23-2005  . Extracorporeal shock wave lithotripsy  05-28-2012  &  10-08-2012  . Cystoscopy with stent placement Right 10/28/2014     Procedure: RIGHT URETERAL STENT PLACEMENT;  Surgeon: Irine Seal, MD;  Location: [facility];  Service: Urology;  Laterality: Right;  . Cystoscopy/retrograde/ureteroscopy/stone extraction with basket Right 11/21/2014    Procedure: CYSTOSCOPY/RIGHT RETROGRADE PYELOGRAM/RIGHT URETEROSCOPY/BASKET EXTRACTION/RIGHT PYELOSCOPY/LASER OF STONE/RIGHT DOUBLE J STENT;  Surgeon: Carolan Clines, MD;  Location: [place];  Service: Urology;  Laterality: Right;  . Holmium laser application Right [id number]    Procedure: HOLMIUM LASER APPLICATION;  Surgeon: Carolan Clines, MD;  Location: [facility];  Service: Urology;  Laterality: Right;  . Cystoscopy with stent placement Right 02/26/2015    Procedure: CYSTOSCOPY RETROGRADE PYELOGRAM WITH STENT PLACEMENT;  Surgeon: Cleon Gustin, MD;  [facility];  Service: Urology;  Laterality: Right;  . Cystoscopy w/ ureteral stent placement Bilateral 05/04/2015    Procedure: CYSTOSCOPY WITH BILATERAL RETROGRADE PYELOGRAM/ WITH INTERPRETATION, EXCHANGE OF RIGHT URETERAL STENT REPLACEMENT AND PLACEMENT LEFT URETERAL STENT PLACEMENT EXAMINATION OF VAGINA;  Surgeon: Carolan Clines, MD;  [facility];  Service: Urology;  Laterality: Bilateral;   Family History  Family History  Problem Relation Age of Onset  . Hypertension Mother   . Cancer Mother 24    breast  . Dementia Mother   . Hypertension Brother   . Diabetes Brother   . Cancer Father 85    pancreatic  . Heart failure Paternal Grandmother   . Bladder Cancer Maternal Grandfather    Social History  reports that she has never smoked. She has never used smokeless tobacco. She reports that she does not drink alcohol or use illicit drugs. Allergies  Allergies  Allergen Reactions  . Vicodin [Hydrocodone-Acetaminophen] Nausea And Vomiting  . Percocet [Oxycodone-Acetaminophen] Nausea And Vomiting   Home medications Prior to Admission medications    Medication Sig Start Date End Date Taking? Authorizing Provider  acetaminophen (TYLENOL) 500 MG tablet Take 500 mg by mouth every 6 (six) hours as needed for moderate pain.   Yes Historical Provider, MD  acidophilus (RISAQUAD) CAPS capsule Take 2 capsules by mouth daily. 05/07/15  Yes Maryann Mikhail, DO  bimatoprost (LUMIGAN) 0.01 % SOLN Place 1 drop into both eyes at bedtime.   Yes Historical Provider, MD  ENSURE PLUS (ENSURE PLUS) LIQD Take 237 mLs by mouth daily as needed (nutritional supplement).    Yes Historical Provider, MD  Nutritional Supplements (FEEDING SUPPLEMENT, NEPRO CARB STEADY,) LIQD Take 237 mLs by mouth daily. 05/07/15  Yes Maryann Mikhail, DO  ranitidine (ZANTAC) 150 MG tablet Take 150 mg by mouth 2 (two) times daily as needed for heartburn.   Yes Historical Provider, MD  timolol (BETIMOL) 0.5 %  ophthalmic solution Place 1 drop into both eyes every morning.    Yes Historical Provider, MD  amoxicillin-clavulanate (AUGMENTIN) 250-125 MG tablet Take 1 tablet by mouth 2 (two) times daily. Patient not taking: Reported on 05/20/2015 05/07/15   Velta Addison Mikhail, DO  HYDROcodone-acetaminophen (NORCO/VICODIN) 5-325 MG tablet Take 1 tablet by mouth 4 (four) times daily as needed for moderate pain.  04/27/15   Historical Provider, MD  saccharomyces boulardii (FLORASTOR) 250 MG capsule Take 1 capsule (250 mg total) by mouth 2 (two) times daily. Patient not taking: Reported on 05/22/2015 05/07/15   Velta Addison Mikhail, DO  sodium chloride (OCEAN) 0.65 % SOLN nasal spray Place 1 spray into both nostrils 3 (three) times daily as needed for congestion.    Historical Provider, MD   Liver Function Tests  Recent Labs Lab 05/22/15 1857 05/23/15 0602  AST 21 22  ALT 19 21  ALKPHOS 107 89  BILITOT 0.7 0.5  PROT 7.5 6.2*  ALBUMIN 3.4* 2.7*   No results for input(s): LIPASE, AMYLASE in the last 168 hours. CBC  Recent Labs Lab 05/22/15 0947 05/22/15 1857 05/23/15 0602  WBC  --  22.2* 27.9*   NEUTROABS  --  21.1* 26.8*  HGB 11.1* 12.9 10.9*  HCT  --  41.4 34.8*  MCV  --  90.2 89.5  PLT  --  367 [id number]   Basic Metabolic Panel  Recent Labs Lab 05/22/15 1857 05/23/15 0602  NA 134* 132*  K 5.2* 5.4*  CL 105 106  CO2 16* 15*  GLUCOSE 144* 143*  BUN 97* 99*  CREATININE 6.58* 7.11*  CALCIUM 9.6 8.7*    Filed Vitals:   05/23/15 1351 05/23/15 1417 05/23/15 1518 05/23/15 1615  BP: 144/67 119/58 122/45 110/45  Pulse: 128 122 123 108  Temp:  101.4 F (38.6 C) 100.4 F (38 C) 102.1 F (38.9 C)  TempSrc:  Oral Oral Oral  Resp: 26 22 22 22   Height:      Weight:      SpO2: 97% 96% 97%    Exam Alert, good color. No distress, calm No rash, cyanosis or gangrene Sclera anicteric, throat clear No jvd Chest is clear bilat RRR no MRG ABd soft ntnd +bs no ascites or hsm R flank perc neph tube draining clear yellow urine MS no joint effusion/ deformity Ext no LE or UE edema R IJ HD cath clean exit site Neuro no asterixis, Ox 3   Assessment: 1 Acute on CKD 4, not uremic 2 Vol depletion 3 High fever / R hydro - prob pyelo, now sp R perc neph tube today 4 Hyperkalemia mild 5 [place] tunneled HD cath , not in use right now  Plan - agree w your management. Cont IVF at 200/ hr.  Follow B/ Cr daily. Will follow. No indication for RRT yet.   Kelly Splinter MD [employer] pager [phone]    cell [phone] 05/23/2015, 4:43 PM

## 2015-05-23 NOTE — Procedures (Signed)
Interventional Radiology Procedure Note  Procedure:  Right percutaneous nephrostomy tube placement  Complications:  None  Estimated Blood Loss:  < 10 mL  Right 10 Fr PCN placed.  Grossly purulent fluid return c/w pyonephrosis.  Sample sent for culture. PCN to gravity bag drainage.  Will follow.  Venetia Night. Kathlene Cote, M.D Pager:  [phone]

## 2015-05-23 NOTE — Progress Notes (Signed)
ANTIBIOTIC CONSULT NOTE - INITIAL  Pharmacy Consult for zosyn Indication: rule out sepsis  Allergies  Allergen Reactions  . Vicodin [Hydrocodone-Acetaminophen] Nausea And Vomiting  . Percocet [Oxycodone-Acetaminophen] Nausea And Vomiting    Patient Measurements: Height: 5\' 3"  (160 cm) Weight: 171 lb (77.565 kg) IBW/kg (Calculated) : 52.4  Vital Signs: Temp: 97.7 F (36.5 C) (12/03 0524) Temp Source: Oral (12/03 0524) BP: 106/60 mmHg (12/03 0524) Pulse Rate: 78 (12/03 0524) Intake/Output from previous day: 12/02 0701 - 12/03 0700 In: [id number] [I.V.:1152] Out: -  Intake/Output from this shift:    Labs:  Recent Labs  05/22/15 0947 05/22/15 1857 05/23/15 0602  WBC  --  22.2* 27.9*  HGB 11.1* 12.9 10.9*  PLT  --  367 330  CREATININE  --  6.58* 7.11*   Estimated Creatinine Clearance: 7.3 mL/min (by C-G formula based on Cr of 7.11). No results for input(s): VANCOTROUGH, VANCOPEAK, VANCORANDOM, GENTTROUGH, GENTPEAK, GENTRANDOM, TOBRATROUGH, TOBRAPEAK, TOBRARND, AMIKACINPEAK, AMIKACINTROU, AMIKACIN in the last 72 hours.   Microbiology: Recent Results (from the past 720 hour(s))  Culture, blood (routine x 2)     Status: None   Collection Time: 05/02/15  6:18 PM  Result Value Ref Range Status   Specimen Description BLOOD LEFT FOREARM  7 ML IN YELLOW BOTTLE  Final   Special Requests NONE  Final   Culture   Final    NO GROWTH 5 DAYS Performed at [facility]    Report Status 05/08/2015 FINAL  Final  Culture, blood (routine x 2)     Status: None   Collection Time: 05/02/15  6:25 PM  Result Value Ref Range Status   Specimen Description BLOOD RIGHT ARM  2 ML IN YELLOW BOTTLE  Final   Special Requests NONE  Final   Culture   Final    NO GROWTH 5 DAYS Performed at [facility]    Report Status 05/08/2015 FINAL  Final  Urine culture     Status: None   Collection Time: 05/02/15  8:20 PM  Result Value Ref Range Status   Specimen Description URINE, CLEAN CATCH   Final   Special Requests NONE  Final   Culture   Final    MULTIPLE SPECIES PRESENT, SUGGEST RECOLLECTION Performed at [facility]    Report Status 05/04/2015 FINAL  Final    Medical History: Past Medical History  Diagnosis Date  . GERD (gastroesophageal reflux disease)   . Glaucoma   . H/O renal calculi 2002 & 2006  . H/O hiatal hernia   . Headache(784.0)     migraine  . Pneumonia     dx 10-06-2014 per CXR--  on 10-27-2014 pt states finished antibiotic and denies cough or fever  . Hyperlipidemia   . History of MI (myocardial infarction)     10/ 2013 in setting of Septic Shock  . History of atrial fibrillation     10/ 2013  in setting of Septic Shock  . History of CHF (congestive heart failure)     10/ 2013 in setting of septic shock  . Sigmoid diverticulosis   . Nephrolithiasis     bilateral  . CKD (chronic kidney disease), stage III     nephrologist-  dr Florene Glen  . Right ureteral stone   . History of acute respiratory failure     10/ 2013  -- ventilated in setting septic shock  . Dysrhythmia     Afib in 2013 when she had septic shock related to stone ureteral  obstruction  . Myocardial infarction [facility])     Was reported in 2013 during hospitalization with septic shock  . Septic shock ([place]) 04/04/2012  . Sepsis ([place])   . Complication of anesthesia     use a little anesthesia , per patient MD states she quit breathing   . Hypertension     medication removed from regimen due to low blood pressure   . Peripheral vascular disease ([place])   . S/Reilly hemodialysis catheter insertion ([place]) 04/11/2015     right anterior chest , only used once   . History of nephrostomy 04/11/2015     currently inplace 04/28/2015   . History of blood transfusion 04/13/2015    Assessment: Patient's a 69 y.o F with hx CKD stage 3 (being worked up outpatient for AVF placement) nephrolithiasis, bilateral hydronephrosis (status post nephrostomy in Nov 2016) who presented to the ED on 12/2 with  c/o fever/fatigue and dysuria. UA was consistent with UTI.  Ceftriaxone started on admission.  Patient remains febrile with wbc trending up -- to change abx to zosyn for broad coverage.  - Tmax 103.1, wbc up 27.9; CKD stage 3, scr up 7.11 (crcl~<10) - LA 0.90 on 12/02 - 12/2 CXR: no active dz  12/2 CTX>> 12/3 12/3 zosyn>>  12/02 ucx:  12/02 bcx x2: 12/02 UA: amber color, turbid, >300 protein, positive nitrite and leukocytes   Goal of Therapy:  Eradication of infection  Plan:  - zosyn 2.25 gm IV q8h - f/u cx and renal function  [name] 05/23/2015,9:01 AM

## 2015-05-23 NOTE — H&P (Signed)
Chief Complaint: Hydronephrosis  Referring Physician(s): Dahl[place]t  History of Present Illness: [name] is a 69 y.o. female with a past medical history of recurrent nephrolithiasis, intermittent bilateral hydronephrosis managed intermittently with bilateral ureteral stents, chronic kidney disease who was admitted in 03/2015 with urosepsis, acute on chronic CKD and recurrent right hydronephrosis.   She underwent nephrostomy tube placement by Dr. Annamaria Boots on 04/11/2015. Her nephrostomy tube was eventually internalized to a right indwelling ureteral stent and the nephrostomy tube was removed on 05/06/15.   She presented the ER overnight with recurrent sepsis of urinary origin and recurrent acute on chronic CKD with a Cr of 7.   She reports that she developed fever, chills, fatigue, decreased oral intake, mild right flank pain, dysuria since yesterday.   She also complains of nocturia over up to 10 times since her nephrostomy was reversed.   She underwent a renal ultrasound today which demonstrates recurrent moderate right hydronephrosis with ureteral stent present within the renal pelvis and bladder ultrasonically. The left kidney had mild fullness.  We are asked to evaluate her today for another right nephrostomy tube.   Past Medical History  Diagnosis Date  . GERD (gastroesophageal reflux disease)   . Glaucoma   . H/O renal calculi 2002 & 2006  . H/O hiatal hernia   . Headache(784.0)     migraine  . Pneumonia     dx 10-06-2014 per CXR--  on 10-27-2014 pt states finished antibiotic and denies cough or fever  . Hyperlipidemia   . History of MI (myocardial infarction)     10/ 2013 in setting of Septic Shock  . History of atrial fibrillation     10/ 2013  in setting of Septic Shock  . History of CHF (congestive heart failure)     10/ 2013 in setting of septic shock  . Sigmoid diverticulosis   . Nephrolithiasis     bilateral  . CKD (chronic kidney disease), stage III       nephrologist-  dr Florene Glen  . Right ureteral stone   . History of acute respiratory failure     10/ 2013  -- ventilated in setting septic shock  . Dysrhythmia     Afib in 2013 when she had septic shock related to stone ureteral obstruction  . Myocardial infarction [facility])     Was reported in 2013 during hospitalization with septic shock  . Septic shock ([place]) 04/04/2012  . Sepsis ([place])   . Complication of anesthesia     use a little anesthesia , per patient MD states she quit breathing   . Hypertension     medication removed from regimen due to low blood pressure   . Peripheral vascular disease ([place])   . S/P hemodialysis catheter insertion ([place]) 04/11/2015     right anterior chest , only used once   . History of nephrostomy 04/11/2015     currently inplace 04/28/2015   . History of blood transfusion 04/13/2015     Past Surgical History  Procedure Laterality Date  . Cystoscopy w/ ureteral stent placement  04/04/2012    Procedure: CYSTOSCOPY WITH RETROGRADE PYELOGRAM/URETERAL STENT PLACEMENT;  Surgeon: Ailene Rud, MD;  Location: [place];  Service: Urology;  Laterality: Left;  . Total abdominal hysterectomy w/ bilateral salpingoophorectomy  1993    secondary to fibroids  . Breast biopsy Left 08/23/07    benign fibrocystic with duct ectasia  . Cardiac catheterization  07-11-2012  dr Irish Lack    Abnormal stress  test/   normal coronary arteries/  LVEDP  18mmHg  . Cardiovascular stress test  06-26-2012  dr Irish Lack    marked ischemia in the basal anterior, mid anterior, apical inferior regions/  normal LVF, ef 63%  . Transthoracic echocardiogram  04-09-2012    normal LVF,  ef 60-65%,  mild LAE,  mild TR, trivial MR and PR  . Cataract extraction w/ intraocular lens  implant, bilateral    . Knee arthroscopy Left 02-14-2003  . Laparoscopic cholecystectomy  03-23-2005  . Extracorporeal shock wave lithotripsy  05-28-2012  &  10-08-2012  . Cystoscopy with stent placement Right  10/28/2014    Procedure: RIGHT URETERAL STENT PLACEMENT;  Surgeon: Irine Seal, MD;  Location: [facility];  Service: Urology;  Laterality: Right;  . Cystoscopy/retrograde/ureteroscopy/stone extraction with basket Right 11/21/2014    Procedure: CYSTOSCOPY/RIGHT RETROGRADE PYELOGRAM/RIGHT URETEROSCOPY/BASKET EXTRACTION/RIGHT PYELOSCOPY/LASER OF STONE/RIGHT DOUBLE J STENT;  Surgeon: Carolan Clines, MD;  Location: [place];  Service: Urology;  Laterality: Right;  . Holmium laser application Right [id number]    Procedure: HOLMIUM LASER APPLICATION;  Surgeon: Carolan Clines, MD;  Location: [facility];  Service: Urology;  Laterality: Right;  . Cystoscopy with stent placement Right 02/26/2015    Procedure: CYSTOSCOPY RETROGRADE PYELOGRAM WITH STENT PLACEMENT;  Surgeon: Cleon Gustin, MD;  Location: [facility];  Service: Urology;  Laterality: Right;  . Cystoscopy w/ ureteral stent placement Bilateral 05/04/2015    Procedure: CYSTOSCOPY WITH BILATERAL RETROGRADE PYELOGRAM/ WITH INTERPRETATION, EXCHANGE OF RIGHT URETERAL STENT REPLACEMENT AND PLACEMENT LEFT URETERAL STENT PLACEMENT EXAMINATION OF VAGINA;  Surgeon: Carolan Clines, MD;  Location: [facility];  Service: Urology;  Laterality: Bilateral;    Allergies: Vicodin and Percocet  Medications: Prior to Admission medications   Medication Sig Start Date End Date Taking? Authorizing Provider  acetaminophen (TYLENOL) 500 MG tablet Take 500 mg by mouth every 6 (six) hours as needed for moderate pain.   Yes Historical Provider, MD  acidophilus (RISAQUAD) CAPS capsule Take 2 capsules by mouth daily. 05/07/15  Yes Maryann Mikhail, DO  bimatoprost (LUMIGAN) 0.01 % SOLN Place 1 drop into both eyes at bedtime.   Yes Historical Provider, MD  ENSURE PLUS (ENSURE PLUS) LIQD Take 237 mLs by mouth daily as needed (nutritional supplement).    Yes Historical Provider, MD  Nutritional Supplements (FEEDING SUPPLEMENT,  NEPRO CARB STEADY,) LIQD Take 237 mLs by mouth daily. 05/07/15  Yes Maryann Mikhail, DO  ranitidine (ZANTAC) 150 MG tablet Take 150 mg by mouth 2 (two) times daily as needed for heartburn.   Yes Historical Provider, MD  timolol (BETIMOL) 0.5 % ophthalmic solution Place 1 drop into both eyes every morning.    Yes Historical Provider, MD  amoxicillin-clavulanate (AUGMENTIN) 250-125 MG tablet Take 1 tablet by mouth 2 (two) times daily. Patient not taking: Reported on 05/20/2015 05/07/15   Velta Addison Mikhail, DO  HYDROcodone-acetaminophen (NORCO/VICODIN) 5-325 MG tablet Take 1 tablet by mouth 4 (four) times daily as needed for moderate pain.  04/27/15   Historical Provider, MD  saccharomyces boulardii (FLORASTOR) 250 MG capsule Take 1 capsule (250 mg total) by mouth 2 (two) times daily. Patient not taking: Reported on 05/22/2015 05/07/15   Velta Addison Mikhail, DO  sodium chloride (OCEAN) 0.65 % SOLN nasal spray Place 1 spray into both nostrils 3 (three) times daily as needed for congestion.    Historical Provider, MD     Family History  Problem Relation Age of Onset  . Hypertension Mother   .  Cancer Mother 57    breast  . Dementia Mother   . Hypertension Brother   . Diabetes Brother   . Cancer Father 11    pancreatic  . Heart failure Paternal Grandmother   . Bladder Cancer Maternal Grandfather     Social History   Social History  . Marital Status: Widowed    Spouse Name: N/A  . Number of Children: 1  . Years of Education: N/A   Occupational History  . retired   . legal assistant    Social History Main Topics  . Smoking status: Never Smoker   . Smokeless tobacco: Never Used  . Alcohol Use: No  . Drug Use: No  . Sexual Activity: No     Comment: widow husband passed 5/05 with lung cancer   Other Topics Concern  . None   Social History Narrative     Review of Systems  Constitutional: Positive for fever, chills, activity change, appetite change and fatigue.  HENT: Negative.    Respiratory: Negative for cough, chest tightness and shortness of breath.   Cardiovascular: Negative for chest pain.  Gastrointestinal: Negative for nausea, vomiting, abdominal pain and abdominal distention.  Genitourinary: Positive for dysuria.  Musculoskeletal: Negative.   Skin: Negative.   Neurological: Negative.   Psychiatric/Behavioral: Negative.     Vital Signs: BP 106/60 mmHg  Pulse 78  Temp(Src) 97.7 F (36.5 C) (Oral)  Resp 18  Ht 5\' 3"  (1.6 m)  Wt 171 lb (77.565 kg)  BMI 30.30 kg/m2  SpO2 98%  LMP 01/19/1992  Physical Exam  Constitutional: She is oriented to person, place, and time. She appears well-developed and well-nourished.  HENT:  Head: Normocephalic and atraumatic.  Eyes: EOM are normal.  Neck: Normal range of motion. Neck supple.  Cardiovascular: Normal rate, regular rhythm and normal heart sounds.   No murmur heard. Pulmonary/Chest: Effort normal and breath sounds normal. No respiratory distress. She has no wheezes.  Abdominal: Soft. Bowel sounds are normal. She exhibits no distension. There is no tenderness.  Musculoskeletal: Normal range of motion.  Neurological: She is alert and oriented to person, place, and time.  Skin: Skin is warm and dry.  Psychiatric: She has a normal mood and affect. Her behavior is normal. Judgment and thought content normal.  Vitals reviewed.   Mallampati Score:  MD Evaluation Airway: WNL Heart: WNL Abdomen: WNL Chest/ Lungs: WNL ASA  Classification: 3 Mallampati/Airway Score: Two  Imaging: Ct Abdomen Pelvis Wo Contrast  05/02/2015  CLINICAL DATA:  Shaking chills and slurring words today. Right nephrostomy tube in place. EXAM: CT ABDOMEN AND PELVIS WITHOUT CONTRAST TECHNIQUE: Multidetector CT imaging of the abdomen and pelvis was performed following the standard protocol without IV contrast. COMPARISON:  CT abdomen and pelvis 02/20/2015 and 04/06/2015. FINDINGS: Hiatal hernia is identified. No pleural or pericardial  effusion. There is some atelectasis or scar in the left lung base. Right nephrostomy tube and right ureteral stent are in place. The tubes are well-positioned. There is no right hydronephrosis. A tiny amount of air is seen in the right intrarenal collecting system likely related to the nephrostomy tube. Atrophy of the left kidney is again seen and there is mild fullness of the left intrarenal collecting system. There is some stranding about both kidneys. The walls left ureter appear somewhat thickened. Urinary bladder is decompressed scratch the urinary bladder is nearly completely decompressed. The gallbladder is been removed. The liver, spleen, adrenal glands and pancreas are unremarkable. The patient is status post hysterectomy.  Fat containing ventral hernias are again seen and unchanged. The largest hernias in the pelvis. Diverticulosis appears worst in the sigmoid but there is no evidence of diverticulitis. The small bowel and appendix are unremarkable. No fluid collection or lymphadenopathy is seen. Lumbar spondylosis appears worst at L2-3. No lytic or sclerotic bony lesion is identified. IMPRESSION: A nephrostomy tube in the right kidney is new since the comparison examinations. The right intrarenal collecting system is decompressed. Tiny amount of gas in an upper pole calyx is likely related to the nephrostomy tube. Right double-J ureteral stent remains in place in good position. Mild stranding about both kidneys. Very mild left hydronephrosis and of the walls of the left ureter is unchanged and may be due to infection or inflammation. Findings raise the possibility of pyelonephritis and/or ureteritis. Diverticulosis without diverticulitis. Hiatal hernia and fat containing ventral hernias, largest in the pelvis, are unchanged. Electronically Signed   By: Inge Rise M.D.   On: 05/02/2015 20:12   Dg Chest 2 View  05/22/2015  CLINICAL DATA:  Sepsis. Shortness of breath. Chronic kidney disease. EXAM:  CHEST  2 VIEW COMPARISON:  05/02/1967 chest radiograph. FINDINGS: Right internal jugular central venous catheter terminates in the lower third of the superior vena cava. Stable cardiomediastinal silhouette with normal heart size and retrocardiac lucency in keeping with known hiatal hernia. No pneumothorax. No pleural effusion. Slightly low lung volumes. No overt pulmonary edema. No focal lung opacity. Partially visualized is a left nephro ureteral stent. IMPRESSION: No active cardiopulmonary disease. Electronically Signed   By: Ilona Sorrel M.D.   On: 05/22/2015 19:52   Dg Chest 2 View  05/02/2015  CLINICAL DATA:  Acute onset of fever and shortness of breath. Initial encounter. EXAM: CHEST  2 VIEW COMPARISON:  Chest radiograph performed 02/25/2015 FINDINGS: The lungs are well-aerated and clear. There is no evidence of focal opacification, pleural effusion or pneumothorax. The heart is normal in size; the mediastinal contour is within normal limits. No acute osseous abnormalities are seen. A large hiatal hernia is noted, partially filled with air. A right-sided dual-lumen catheter is noted ending about the mid SVC. IMPRESSION: 1. No acute cardiopulmonary process are seen. 2. Large hiatal hernia noted. Electronically Signed   By: Garald Balding M.D.   On: 05/02/2015 18:20   US Renal  05/23/2015  CLINICAL DATA:  Pyelonephritis. Status post prior right percutaneous nephrostomy tube placement on 04/11/2015 for hydronephrosis. This nephrostomy tube was removed on 05/06/2015 and the patient has an indwelling right ureteral stent. EXAM: RENAL / URINARY TRACT ULTRASOUND COMPLETE COMPARISON:  CT of the abdomen on 05/02/2015 and prior renal ultrasound on 04/11/2015. FINDINGS: Right Kidney: Length: 11.6 cm. Moderate right hydronephrosis present. Portion of the proximal and of a ureteral stent is visible in the renal pelvis. Re- development of hydronephrosis may implicate relative stent malfunction or occlusion. No focal  renal lesions identified on the right. No focal perinephric fluid collections seen by ultrasound. Left Kidney: Length: 9.4 cm. The left kidney demonstrates mild fullness of the collecting system without significant hydronephrosis. No focal renal lesion. Bladder: The bladder contains dependent debris. Distal portion of the right ureteral stent is visible in the bladder lumen. IMPRESSION: Moderate right hydronephrosis with indwelling right ureteral stent visualized. Re-development of hydronephrosis may implicate relative stent malfunction/occlusion. Electronically Signed   By: Aletta Edouard M.D.   On: 05/23/2015 10:00   Ir Nephro Tube Remov/fl  05/06/2015  INDICATION: 69 year old female with a history of hydronephrosis. Percutaneous nephrostomy placed on  the right 04/11/2015. The patient has since undergone ureteral stenting, and the percutaneous nephrostomy may be removed at this time. This will be performed under fluoroscopy to assure that the stents does not get displaced. EXAM: REMOVAL OF PERCUTANEOUS NEPHROSTOMY MEDICATIONS: None ANESTHESIA/SEDATION: None CONTRAST:  None COMPLICATIONS: None PROCEDURE: Informed consent was obtained from the patient following an explanation of the procedure, risks, benefits and alternatives. The patient understands, agrees and consents for the procedure. All questions were addressed. A time out was performed prior to the initiation of the procedure. The patient was positioned prone and non-contrast localization CT was performed of the pelvis to demonstrate the iliac marrow spaces. The operative site was prepped and draped in the usual sterile fashion. Under fluoroscopic observation, the right-sided percutaneous nephrostomy tube was removed after ligation of the tube. The indwelling right-sided ureteral stent is unchanged in position. Patient tolerated the procedure well and remained hemodynamically stable throughout. No complications encountered. IMPRESSION: Status post  fluoroscopic observation of right percutaneous nephrostomy removal. Unchanged position of right ureteral stent. Signed, Dulcy Fanny. Earleen Newport, DO [facility] Electronically Signed   By: Corrie Mckusick D.O.   On: 05/06/2015 12:30    Labs:  CBC:  Recent Labs  05/05/15 0502 05/06/15 0520 05/15/15 1124 05/22/15 0947 05/22/15 1857 05/23/15 0602  WBC 9.6 8.5  --   --  22.2* 27.9*  HGB 9.8* 9.7* 10.3* 11.1* 12.9 10.9*  HCT 31.4* 31.6*  --   --  41.4 34.8*  PLT 218 200  --   --  367 330    COAGS:  Recent Labs  11/23/14 0305 02/26/15 0015 05/04/15 0435  INR 1.31 1.17 1.24  APTT  --  33  --     BMP:  Recent Labs  05/06/15 0520 05/07/15 0529 05/22/15 1857 05/23/15 0602  NA 134* 137 134* 132*  K 3.7 3.9 5.2* 5.4*  CL 106 109 105 106  CO2 19* 20* 16* 15*  GLUCOSE 106* 105* 144* 143*  BUN 39* 39* 97* 99*  CALCIUM 9.0 9.1 9.6 8.7*  CREATININE 4.95* 5.23* 6.58* 7.11*  GFRNONAA 8* 8* 6* 5*  GFRAA 9* 9* 7* 6*    LIVER FUNCTION TESTS:  Recent Labs  05/04/15 0435 05/06/15 0520 05/22/15 1857 05/23/15 0602  BILITOT 0.5 0.4 0.7 0.5  AST 11* 10* 21 22  ALT 11* 13* 19 21  ALKPHOS 53 63 107 89  PROT 5.9* 6.1* 7.5 6.2*  ALBUMIN 2.8* 2.6* 3.4* 2.7*    TUMOR MARKERS: No results for input(s): AFPTM, CEA, CA199, CHROMGRNA in the last 8760 hours.  Assessment and Plan:  Nephrolithiasis, intermittent bilateral hydronephrosis managed intermittently with bilateral ureteral stents  Chronic kidney disease who was admitted in 03/2015 with urosepsis, acute on chronic CKD and recurrent right hydronephrosis.   Nephrostomy tube placement by Dr. Annamaria Boots on 04/11/2015 which was eventually internalized to a right indwelling ureteral stent and the nephrostomy tube was removed on 05/06/15.   Presented to ER overnight with recurrent sepsis of urinary origin and recurrent acute on chronic CKD with a Cr of 7  Hydronephrosis on  [name]  Will proceed with Perc Nephrostomy tube on the right today by Dr. Kathlene Cote.  Risks and Benefits discussed with the patient including bleeding, infection, damage to adjacent structures, bowel perforation/fistula connection, and sepsis.  All of the patient's questions were answered, patient is agreeable to proceed. Consent signed and in chart.  Thank you for this interesting consult.  I greatly enjoyed meeting  [name] and look forward to participating in their care.  A copy of this report was sent to the requesting provider on this date.  Signed: Murrell Redden PA-C 05/23/2015, 10:55 AM   I spent a total of 20 Minutes in face to face in clinical consultation, greater than 50% of which was counseling/coordinating care for perc nephrostomy tube.

## 2015-05-23 NOTE — Progress Notes (Signed)
ANTIBIOTIC CONSULT NOTE - INITIAL  Pharmacy Consult for adding vancomycin to zosyn Indication: rule out sepsis  Allergies  Allergen Reactions  . Vicodin [Hydrocodone-Acetaminophen] Nausea And Vomiting  . Percocet [Oxycodone-Acetaminophen] Nausea And Vomiting    Patient Measurements: Height: 5\' 3"  (160 cm) Weight: 171 lb (77.565 kg) IBW/kg (Calculated) : 52.4  Vital Signs: Temp: 103 F (39.4 C) (12/03 2033) Temp Source: Oral (12/03 2033) BP: 110/44 mmHg (12/03 2033) Pulse Rate: 142 (12/03 2033) Intake/Output from previous day: 12/02 0701 - 12/03 0700 In: 1152 [I.V.:1152] Out: -  Intake/Output from this shift:    Labs:  Recent Labs  05/22/15 0947 05/22/15 1857 05/23/15 0602  WBC  --  22.2* 27.9*  HGB 11.1* 12.9 10.9*  PLT  --  367 330  CREATININE  --  6.58* 7.11*   Estimated Creatinine Clearance: 7.3 mL/min (by C-G formula based on Cr of 7.11). No results for input(s): VANCOTROUGH, VANCOPEAK, VANCORANDOM, GENTTROUGH, GENTPEAK, GENTRANDOM, TOBRATROUGH, TOBRAPEAK, TOBRARND, AMIKACINPEAK, AMIKACINTROU, AMIKACIN in the last 72 hours.   Microbiology: Recent Results (from the past 720 hour(s))  Culture, blood (routine x 2)     Status: None   Collection Time: 05/02/15  6:18 PM  Result Value Ref Range Status   Specimen Description BLOOD LEFT FOREARM  7 ML IN YELLOW BOTTLE  Final   Special Requests NONE  Final   Culture   Final    NO GROWTH 5 DAYS Performed at [facility]    Report Status 05/08/2015 FINAL  Final  Culture, blood (routine x 2)     Status: None   Collection Time: 05/02/15  6:25 PM  Result Value Ref Range Status   Specimen Description BLOOD RIGHT ARM  2 ML IN YELLOW BOTTLE  Final   Special Requests NONE  Final   Culture   Final    NO GROWTH 5 DAYS Performed at [facility]    Report Status 05/08/2015 FINAL  Final  Urine culture     Status: None   Collection Time: 05/02/15  8:20 PM  Result Value Ref Range Status   Specimen  Description URINE, CLEAN CATCH  Final   Special Requests NONE  Final   Culture   Final    MULTIPLE SPECIES PRESENT, SUGGEST RECOLLECTION Performed at [facility]    Report Status 05/04/2015 FINAL  Final  Culture, blood (routine x 2)     Status: None (Preliminary result)   Collection Time: 05/22/15  6:58 PM  Result Value Ref Range Status   Specimen Description BLOOD RIGHT ANTECUBITAL  Final   Special Requests IN PEDIATRIC BOTTLE 4 ML  Final   Culture   Final    NO GROWTH < 24 HOURS Performed at [facility]    Report Status PENDING  Incomplete  Culture, blood (routine x 2)     Status: None (Preliminary result)   Collection Time: 05/22/15  7:02 PM  Result Value Ref Range Status   Specimen Description BLOOD RIGHT HAND  Final   Special Requests IN PEDIATRIC BOTTLE 3 CC  Final   Culture   Final    NO GROWTH < 24 HOURS Performed at [facility]    Report Status PENDING  Incomplete  Urine culture     Status: None (Preliminary result)   Collection Time: 05/22/15  7:56 PM  Result Value Ref Range Status   Specimen Description URINE, CLEAN CATCH  Final   Special Requests NONE  Final   Culture   Final  TOO YOUNG TO READ Performed at [facility]    Report Status PENDING  Incomplete    Medical History: Past Medical History  Diagnosis Date  . GERD (gastroesophageal reflux disease)   . Glaucoma   . H/O renal calculi 2002 & 2006  . H/O hiatal hernia   . Headache(784.0)     migraine  . Pneumonia     dx 10-06-2014 per CXR--  on 10-27-2014 pt states finished antibiotic and denies cough or fever  . Hyperlipidemia   . History of MI (myocardial infarction)     10/ 2013 in setting of Septic Shock  . History of atrial fibrillation     10/ 2013  in setting of Septic Shock  . History of CHF (congestive heart failure)     10/ 2013 in setting of septic shock  . Sigmoid diverticulosis   . Nephrolithiasis     bilateral  . CKD (chronic kidney disease),  stage III     nephrologist-  dr Florene Glen  . Right ureteral stone   . History of acute respiratory failure     10/ 2013  -- ventilated in setting septic shock  . Dysrhythmia     Afib in 2013 when she had septic shock related to stone ureteral obstruction  . Myocardial infarction [facility])     Was reported in 2013 during hospitalization with septic shock  . Septic shock ([place]) 04/04/2012  . Sepsis ([place])   . Complication of anesthesia     use a little anesthesia , per patient MD states she quit breathing   . Hypertension     medication removed from regimen due to low blood pressure   . Peripheral vascular disease ([place])   . S/P hemodialysis catheter insertion ([place]) 04/11/2015     right anterior chest , only used once   . History of nephrostomy 04/11/2015     currently inplace 04/28/2015   . History of blood transfusion 04/13/2015    Assessment: Patient's a 69 y.o F with hx CKD stage 3 (being worked up outpatient for AVF placement) nephrolithiasis, bilateral hydronephrosis (status post nephrostomy in Nov 2016) who presented to the ED on 12/2 with c/o fever/fatigue and dysuria. UA was consistent with UTI.  Ceftriaxone started on admission.  Patient remains febrile with wbc trending up -- to change abx to zosyn for broad coverage.  - Tmax 103.1, wbc up 27.9; CKD stage 3, scr up 7.11 (crcl~<10) - LA 0.90 on 12/02 - 12/2 CXR: no active dz  12/2 CTX>> 12/3 12/3 zosyn>> 12/3 vancomycin >>  12/02 ucx:  12/02 bcx x2: NGTD 12/02 UA: amber color, turbid, >300 protein, positive nitrite and leukocytes   Goal of Therapy:  Eradication of infection Vancomycin trough 15-78mcg/ml, if pyelonephritis then can decrease to 10-15 mcg/ml  Plan:  - vancomycin 1500mg  IV q24h  - check random level 12/5am with am labs   - dose off random levels for now until SCr stabilizes (s/p nephrostomy tube 12/3) - Continue zosyn 2.25 gm IV q8h - f/u cx and renal function  Doreene Eland, PharmD, BCPS.   Pager:  [phone] 05/23/2015 8:55 PM

## 2015-05-23 NOTE — Progress Notes (Signed)
[facility] PROGRESS NOTE  [name] [id number] DOB: [birth year] DOA: 05/22/2015 PCP: [name], MD Brief narrative 69 year old female with recurrent peripheral lithiasis with intermittent bilateral hydronephrosis requiring ureteral stent and right-sided nephrostomy, CKD stage V (has HD catheter,  scheduled for AV fistula placement next week) was recently hospitalized for sepsis secondary to pyelonephritis and underwent removal of right percutaneous nephrostomy and lateral double J stent placement with stent exchange and discharged home on antibiotics. Patient had nephrostomy tube placement on 04/11/2015 which was internalized right indwelling ureteral stent and nephrostomy tube was removed on 05/06/2015 during her recent hospitalization. Patient returned to the ED 12/2 with fevers with chills and temperature 103F, poor by mouth intake with dysuria, increased urinary frequency and mild right flank pain. She also found to have acute on chronic kidney disease with creatinine elevated up to 7.1. Patient admitted to hospitalist service for sepsis. Renal ultrasound done showing fatty food and moderate right hydronephrosis with ureteral stent within the renal pelvis and bladder concerning for ureteral stent failure. Urology following and consulted IR for emergent right nephrostomy tube placement.   Assessment/Plan: Severe sepsis Secondary to UTI/pyelonephritis with persistent hydronephrosis. Getting maintenance IV fluids. Will bolus her with 2 L IV normal saline. Place foley to decompress the bladder. Follow blood and urine culture. Would care with pain medicine and Tylenol. May need long-term suppressive antibiotics given recurrent urosepsis Monitor WBC (elevated this morning).  Persistent hydronephrosis with recurrent urosepsis Renal ultrasound showing persistent hydronephrosis despite ureteral stent with good positioning. Urology recommended emergent right nephrostomy tube  placement given ureteral stent failure. Recommend removal of the ureteral stent once sepsis results. Monitor renal function and urine output.  Acute on chronic kidney disease stage V Possibly associated with dehydration and sepsis versus hydronephrosis. Follows with Dr. Florene Glen. Has HD  catheter placed during prior hospitalization. ( received only 1 session of dialysis during that time was present. For AV fistula placement next week. Check UA, urine lites and urine osmolality.  No uremic signs or symptoms. Replenish low bicarbonate. Renal consulted.  Anemia of chronic disease Hemoglobin stable  Chronic diastolic CHF Patient is hypovolemic.  Hyperkalemia Monitor on telemetry.  GERD Continue PPI  Protein calorie malnutrition Continue supplement  DVT prophylaxis: SCD Diet: Nothing by mouth  Code Status: Full code Family Communication: Daughter at bedside Disposition Plan: Continue telemetry monitoring. Needs transfer to stepdown unit if sepsis worsens.   Consultants:  Urology  IR  Procedures:  Renal ultrasound  For right nephrostomy today by IR  Antibiotics:  IV Rocephin 1  IV Zosyn 12/3-  HPI/Subjective: Seen and examined. Pt having active chills and feeling cold. Denies dysuria or flank pain at this time.  Objective: Filed Vitals:   05/23/15 0010 05/23/15 0524  BP: 119/58 106/60  Pulse: 91 78  Temp: 97.4 F (36.3 C) 97.7 F (36.5 C)  Resp: 20 18    Intake/Output Summary (Last 24 hours) at 05/23/15 1202 Last data filed at 05/23/15 1139  Gross per 24 hour  Intake   1152 ml  Output    200 ml  Net    952 ml   Filed Weights   05/22/15 2012  Weight: 77.565 kg (171 lb)    Exam:   General:  Elderly female lying in bed shaking with chills  HEENT: Pallor present, dry oral mucosa, supple  Chest: Auscultation bilaterally  CVS: Normal S1 and S2, no murmurs rub or gallop  GI: Soft, nondistended, nontender, bowel sounds present, no flank  tenderness, right  nephrostomy removal site is clean  Scheduled: Warm, no edema  CNS: Alert and oriented  Data Reviewed: Basic Metabolic Panel:  Recent Labs Lab 05/22/15 1857 05/23/15 0602  NA 134* 132*  K 5.2* 5.4*  CL 105 106  CO2 16* 15*  GLUCOSE 144* 143*  BUN 97* 99*  CREATININE 6.58* 7.11*  CALCIUM 9.6 8.7*  MG  --  1.9   Liver Function Tests:  Recent Labs Lab 05/22/15 1857 05/23/15 0602  AST 21 22  ALT 19 21  ALKPHOS 107 89  BILITOT 0.7 0.5  PROT 7.5 6.2*  ALBUMIN 3.4* 2.7*   No results for input(s): LIPASE, AMYLASE in the last 168 hours. No results for input(s): AMMONIA in the last 168 hours. CBC:  Recent Labs Lab 05/22/15 0947 05/22/15 1857 05/23/15 0602  WBC  --  22.2* 27.9*  NEUTROABS  --  21.1* 26.8*  HGB 11.1* 12.9 10.9*  HCT  --  41.4 34.8*  MCV  --  90.2 89.5  PLT  --  367 330   Cardiac Enzymes: No results for input(s): CKTOTAL, CKMB, CKMBINDEX, TROPONINI in the last 168 hours. BNP (last 3 results)  Recent Labs  02/26/15 0530 05/23/15 0602  BNP 35.3 37.2    ProBNP (last 3 results) No results for input(s): PROBNP in the last 8760 hours.  CBG: No results for input(s): GLUCAP in the last 168 hours.  No results found for this or any previous visit (from the past 240 hour(s)).   Studies: Dg Chest 2 View  05/22/2015  CLINICAL DATA:  Sepsis. Shortness of breath. Chronic kidney disease. EXAM: CHEST  2 VIEW COMPARISON:  05/02/1967 chest radiograph. FINDINGS: Right internal jugular central venous catheter terminates in the lower third of the superior vena cava. Stable cardiomediastinal silhouette with normal heart size and retrocardiac lucency in keeping with known hiatal hernia. No pneumothorax. No pleural effusion. Slightly low lung volumes. No overt pulmonary edema. No focal lung opacity. Partially visualized is a left nephro ureteral stent. IMPRESSION: No active cardiopulmonary disease. Electronically Signed   By: Ilona Sorrel M.D.    On: 05/22/2015 19:52   US Renal  05/23/2015  CLINICAL DATA:  Pyelonephritis. Status post prior right percutaneous nephrostomy tube placement on 04/11/2015 for hydronephrosis. This nephrostomy tube was removed on 05/06/2015 and the patient has an indwelling right ureteral stent. EXAM: RENAL / URINARY TRACT ULTRASOUND COMPLETE COMPARISON:  CT of the abdomen on 05/02/2015 and prior renal ultrasound on 04/11/2015. FINDINGS: Right Kidney: Length: 11.6 cm. Moderate right hydronephrosis present. Portion of the proximal and of a ureteral stent is visible in the renal pelvis. Re- development of hydronephrosis may implicate relative stent malfunction or occlusion. No focal renal lesions identified on the right. No focal perinephric fluid collections seen by ultrasound. Left Kidney: Length: 9.4 cm. The left kidney demonstrates mild fullness of the collecting system without significant hydronephrosis. No focal renal lesion. Bladder: The bladder contains dependent debris. Distal portion of the right ureteral stent is visible in the bladder lumen. IMPRESSION: Moderate right hydronephrosis with indwelling right ureteral stent visualized. Re-development of hydronephrosis may implicate relative stent malfunction/occlusion. Electronically Signed   By: Aletta Edouard M.D.   On: 05/23/2015 10:00    Scheduled Meds: . sodium chloride   Intravenous STAT  . famotidine  20 mg Oral BID  . feeding supplement (NEPRO CARB STEADY)  237 mL Oral Q24H  . latanoprost  1 drop Both Eyes QHS  . pantoprazole  40 mg Oral Daily  . piperacillin-tazobactam (  ZOSYN)  IV  2.25 g Intravenous Q8H  . sodium bicarbonate  650 mg Oral BID  . sodium chloride  3 mL Intravenous Q12H  . timolol  1 drop Both Eyes q morning - 10a   Continuous Infusions: . sodium chloride 100 mL/hr at 05/23/15 0441      Time spent: 35 minutes    Layten Aiken, [place]  [facility] Pager [phone]. If 7PM-7AM, please contact night-coverage at [email or web address],  password [facility] 05/23/2015, 12:02 PM  LOS: 1 day

## 2015-05-24 DIAGNOSIS — N132 Hydronephrosis with renal and ureteral calculous obstruction: Secondary | ICD-10-CM | POA: Insufficient documentation

## 2015-05-24 DIAGNOSIS — E872 Acidosis, unspecified: Secondary | ICD-10-CM | POA: Insufficient documentation

## 2015-05-24 DIAGNOSIS — N1330 Unspecified hydronephrosis: Secondary | ICD-10-CM

## 2015-05-24 LAB — C DIFFICILE QUICK SCREEN W PCR REFLEX
C Diff antigen: POSITIVE — AB
C Diff toxin: NEGATIVE

## 2015-05-24 LAB — CBC WITH DIFFERENTIAL/PLATELET
Basophils Absolute: 0 10*3/uL (ref 0.0–0.1)
Basophils Relative: 0 %
Eosinophils Absolute: 0 10*3/uL (ref 0.0–0.7)
Eosinophils Relative: 0 %
HCT: 33.1 % — ABNORMAL LOW (ref 36.0–46.0)
Hemoglobin: 10.1 g/dL — ABNORMAL LOW (ref 12.0–15.0)
Lymphocytes Relative: 1 %
Lymphs Abs: 0.3 10*3/uL — ABNORMAL LOW (ref 0.7–4.0)
MCH: 27.8 pg (ref 26.0–34.0)
MCHC: 30.5 g/dL (ref 30.0–36.0)
MCV: 91.2 fL (ref 78.0–100.0)
Monocytes Absolute: 0.3 10*3/uL (ref 0.1–1.0)
Monocytes Relative: 1 %
Neutro Abs: 28 10*3/uL — ABNORMAL HIGH (ref 1.7–7.7)
Neutrophils Relative %: 98 %
Platelets: 303 10*3/uL (ref 150–400)
RBC: 3.63 MIL/uL — ABNORMAL LOW (ref 3.87–5.11)
RDW: 17.7 % — ABNORMAL HIGH (ref 11.5–15.5)
WBC: 28.6 10*3/uL — ABNORMAL HIGH (ref 4.0–10.5)

## 2015-05-24 LAB — COMPREHENSIVE METABOLIC PANEL
ALT: 21 U/L (ref 14–54)
AST: 23 U/L (ref 15–41)
Albumin: 2.4 g/dL — ABNORMAL LOW (ref 3.5–5.0)
Alkaline Phosphatase: 75 U/L (ref 38–126)
Anion gap: 13 (ref 5–15)
BUN: 94 mg/dL — ABNORMAL HIGH (ref 6–20)
CO2: 10 mmol/L — ABNORMAL LOW (ref 22–32)
Calcium: 8.1 mg/dL — ABNORMAL LOW (ref 8.9–10.3)
Chloride: 108 mmol/L (ref 101–111)
Creatinine, Ser: 6.73 mg/dL — ABNORMAL HIGH (ref 0.44–1.00)
GFR calc Af Amer: 6 mL/min — ABNORMAL LOW (ref >60)
GFR calc non Af Amer: 6 mL/min — ABNORMAL LOW (ref >60)
Glucose, Bld: 107 mg/dL — ABNORMAL HIGH (ref 65–99)
Potassium: 5.4 mmol/L — ABNORMAL HIGH (ref 3.5–5.1)
Sodium: 131 mmol/L — ABNORMAL LOW (ref 135–145)
Total Bilirubin: 0.4 mg/dL (ref 0.3–1.2)
Total Protein: 5.5 g/dL — ABNORMAL LOW (ref 6.5–8.1)

## 2015-05-24 MED ORDER — SACCHAROMYCES BOULARDII 250 MG PO CAPS
250.0000 mg | ORAL_CAPSULE | Freq: Two times a day (BID) | ORAL | Status: DC
  Administered 2015-05-24 – 2015-05-31 (×15): 250 mg via ORAL
  Filled 2015-05-24 (×16): qty 1

## 2015-05-24 MED ORDER — FAMOTIDINE 20 MG PO TABS
20.0000 mg | ORAL_TABLET | Freq: Every day | ORAL | Status: DC
Start: 2015-05-24 — End: 2015-05-24
  Filled 2015-05-24: qty 1

## 2015-05-24 MED ORDER — SODIUM CHLORIDE 0.9 % IV SOLN: INTRAVENOUS | Status: DC

## 2015-05-24 MED ORDER — FAMOTIDINE 20 MG PO TABS
20.0000 mg | ORAL_TABLET | Freq: Every day | ORAL | Status: DC
  Administered 2015-05-25 – 2015-05-31 (×7): 20 mg via ORAL
  Filled 2015-05-24 (×7): qty 1

## 2015-05-24 MED ORDER — SODIUM BICARBONATE 8.4 % IV SOLN
100.0000 meq | Freq: Once | INTRAVENOUS | Status: AC
  Administered 2015-05-24: 100 meq via INTRAVENOUS
  Filled 2015-05-24: qty 100

## 2015-05-24 MED ORDER — NEPRO/CARBSTEADY PO LIQD
237.0000 mL | Freq: Two times a day (BID) | ORAL | Status: DC
  Administered 2015-05-24 – 2015-05-28 (×6): 237 mL via ORAL
  Filled 2015-05-24 (×9): qty 237

## 2015-05-24 MED ORDER — STERILE WATER FOR INJECTION IV SOLN
INTRAVENOUS | Status: DC
  Administered 2015-05-24 – 2015-05-25 (×3): via INTRAVENOUS
  Filled 2015-05-24 (×4): qty 850

## 2015-05-24 NOTE — Progress Notes (Signed)
Follow up vital signs on 05/24/15 0204am. Noted pt BP at 99/51, Temp at 98.3, Pulse at 97,  Resp at 21,  98% on room air. Pt continues to sleep peacefully at this time and not require anything for discomfort at this time. Will continue to monitor per MD orders and unit protocol. Call light and phone within reach for patient communication.

## 2015-05-24 NOTE — Progress Notes (Signed)
Patients HR elevated at 144 per cardiac monitor, Patients vital signs taken temp at 103.4.  Covers removed, tylenol given, ice packs placed under arms and groin.  Md notified, orders given for 250cc NS bolus, blood cultures drawn, chest x-ray ordered.  Will continue to monitor closely.  Diante Barley RN

## 2015-05-24 NOTE — Progress Notes (Signed)
Initial Nutrition Assessment  DOCUMENTATION CODES:   Severe malnutrition in context of chronic illness  INTERVENTION:   -Continue Nepro Shake po BID, each supplement provides 425 kcal and 19 grams protein -Encourage PO intake -RD to continue to monitor  NUTRITION DIAGNOSIS:   Inadequate oral intake related to poor appetite as evidenced by per patient/family report.  GOAL:   Patient will meet greater than or equal to 90% of their needs  MONITOR:   PO intake, Supplement acceptance, Labs, Weight trends, Skin, I & O's  REASON FOR ASSESSMENT:   Malnutrition Screening Tool    ASSESSMENT:   69 year old female with recurrent peripheral lithiasis with intermittent bilateral hydronephrosis requiring ureteral stent and right-sided nephrostomy, CKD stage V (has HD catheter, scheduled for AV fistula placement next week) was recently hospitalized for sepsis secondary to pyelonephritis and underwent removal of right percutaneous nephrostomy and lateral double J stent placement with stent exchange and discharged home on antibiotics. Patient had nephrostomy tube placement on 04/11/2015 which was internalized right indwelling ureteral stent and nephrostomy tube was removed on 05/06/2015 during her recent hospitalization. Patient returned to the ED 12/2 with fevers with chills and temperature 103F, poor by mouth intake with dysuria, increased urinary frequency and mild right flank pain. 12/3: S/P percutaneous nephrostomy tube placement  Patient with poor appetite and intake that has persisted over the last couple of months. During a previous admission she was receiving temporary HD. No longer receiving HD.  Pt on renal diet with 2L fluid restriction. Has been ordered Nepro shakes. Patient with 28 lb weight loss since 9/11 (14% weight loss x 3 months, significant for time frame).  No muscle or fat wasting noticed.  Labs reviewed: Low Na Elevated K, BUN, Creatinine Mg WNL  Diet Order:  Diet  renal with fluid restriction Fluid restriction:: 2000 mL Fluid; Room service appropriate?: Yes; Fluid consistency:: Thin  Skin:  Wound (see comment) (perineum incision)  Last BM:  12/3  Height:   Ht Readings from Last 1 Encounters:  05/22/15 5\' 3"  (1.6 m)    Weight:   Wt Readings from Last 1 Encounters:  05/22/15 171 lb (77.565 kg)    Ideal Body Weight:  52.3 kg  BMI:  Body mass index is 30.3 kg/(m^2).  Estimated Nutritional Needs:   Kcal:  1900-2100  Protein:  90-100g  Fluid:  2L/day  EDUCATION NEEDS:   No education needs identified at this time  Clayton Bibles, MS, RD, LDN Pager: [phone] After Hours Pager: [phone]

## 2015-05-24 NOTE — Progress Notes (Signed)
Subjective: Patient reports that she feels slightly better. No significant right flank pain. I appreciate Dr. Margaretmary Dys input/management  Objective: Vital signs in last 24 hours: Temp:  [97.9 F (36.6 C)-103 F (39.4 C)] 103 F (39.4 C) (12/04 0753) Pulse Rate:  [97-142] 108 (12/04 0600) Resp:  [21-26] 23 (12/04 0600) BP: (99-144)/(44-69) 105/61 mmHg (12/04 0600) SpO2:  [94 %-100 %] 100 % (12/04 0600)  Intake/Output from previous day: 12/03 0701 - 12/04 0700 In: 3498.3 [P.O.:240; I.V.:3258.3] Out: 1050 [Urine:1050] Intake/Output this shift:    Physical Exam:  Constitutional: Vital signs reviewed. WD WN-appears more comfortable than yesterday Eyes: PERRL, No scleral icterus.   Cardiovascular: RRR Pulmonary/Chest: Normal effort   Lab Results:  Recent Labs  05/22/15 1857 05/23/15 0602 05/24/15 0522  HGB 12.9 10.9* 10.1*  HCT 41.4 34.8* 33.1*   BMET  Recent Labs  05/23/15 0602 05/24/15 0522  NA 132* 131*  K 5.4* 5.4*  CL 106 108  CO2 15* 10*  GLUCOSE 143* 107*  BUN 99* 94*  CREATININE 7.11* 6.73*  CALCIUM 8.7* 8.1*    Recent Labs  05/23/15 1450  INR 1.30   No results for input(s): LABURIN in the last 72 hours. Results for orders placed or performed during the hospital encounter of 05/22/15  Culture, blood (routine x 2)     Status: None (Preliminary result)   Collection Time: 05/22/15  6:58 PM  Result Value Ref Range Status   Specimen Description BLOOD RIGHT ANTECUBITAL  Final   Special Requests IN PEDIATRIC BOTTLE 4 ML  Final   Culture   Final    NO GROWTH < 24 HOURS Performed at [facility]    Report Status PENDING  Incomplete  Culture, blood (routine x 2)     Status: None (Preliminary result)   Collection Time: 05/22/15  7:02 PM  Result Value Ref Range Status   Specimen Description BLOOD RIGHT HAND  Final   Special Requests IN PEDIATRIC BOTTLE 3 CC  Final   Culture   Final    NO GROWTH < 24 HOURS Performed at [facility]    Report Status PENDING  Incomplete  Urine culture     Status: None (Preliminary result)   Collection Time: 05/22/15  7:56 PM  Result Value Ref Range Status   Specimen Description URINE, CLEAN CATCH  Final   Special Requests NONE  Final   Culture   Final    TOO YOUNG TO READ Performed at [facility]    Report Status PENDING  Incomplete    Studies/Results: Dg Chest 2 View  05/22/2015  CLINICAL DATA:  Sepsis. Shortness of breath. Chronic kidney disease. EXAM: CHEST  2 VIEW COMPARISON:  05/02/1967 chest radiograph. FINDINGS: Right internal jugular central venous catheter terminates in the lower third of the superior vena cava. Stable cardiomediastinal silhouette with normal heart size and retrocardiac lucency in keeping with known hiatal hernia. No pneumothorax. No pleural effusion. Slightly low lung volumes. No overt pulmonary edema. No focal lung opacity. Partially visualized is a left nephro ureteral stent. IMPRESSION: No active cardiopulmonary disease. Electronically Signed   By: Ilona Sorrel M.D.   On: 05/22/2015 19:52   US Renal  05/23/2015  CLINICAL DATA:  Pyelonephritis. Status post prior right percutaneous nephrostomy tube placement on 04/11/2015 for hydronephrosis. This nephrostomy tube was removed on 05/06/2015 and the patient has an indwelling right ureteral stent. EXAM: RENAL / URINARY TRACT ULTRASOUND COMPLETE COMPARISON:  CT of the abdomen on 05/02/2015 and  prior renal ultrasound on 04/11/2015. FINDINGS: Right Kidney: Length: 11.6 cm. Moderate right hydronephrosis present. Portion of the proximal and of a ureteral stent is visible in the renal pelvis. Re- development of hydronephrosis may implicate relative stent malfunction or occlusion. No focal renal lesions identified on the right. No focal perinephric fluid collections seen by ultrasound. Left Kidney: Length: 9.4 cm. The left kidney demonstrates mild fullness of the collecting system without significant  hydronephrosis. No focal renal lesion. Bladder: The bladder contains dependent debris. Distal portion of the right ureteral stent is visible in the bladder lumen. IMPRESSION: Moderate right hydronephrosis with indwelling right ureteral stent visualized. Re-development of hydronephrosis may implicate relative stent malfunction/occlusion. Electronically Signed   By: Aletta Edouard M.D.   On: 05/23/2015 10:00   Dg Chest Port 1 View  05/23/2015  CLINICAL DATA:  69-year-old female with fever. Patient denies chest complaint. EXAM: PORTABLE CHEST 1 VIEW COMPARISON:  Chest radiograph dated 05/22/2015 FINDINGS: A dialysis catheter is noted in stable positioning. There is an area of increased opacity in the left lung base may represent atelectatic changes. Pneumonia is not excluded. Clinical correlation recommended. Frontal and lateral views of the chest may provide better evaluation. There is no pleural effusion or pneumothorax. Stable cardiac silhouette. The osseous structures are grossly unremarkable. IMPRESSION: Left lung base subsegmental atelectasis. Developing pneumonia is not excluded. Clinical correlation is recommended. Electronically Signed   By: Anner Crete M.D.   On: 05/23/2015 23:24   Ir Nephrostomy Placement Right  05/23/2015  CLINICAL DATA:  Right-sided hydronephrosis with clinical evidence of pyelonephritis and urosepsis. Indwelling ureteral stent is present which is presumably obstructed. The patient requires emergent right-sided percutaneous nephrostomy to decompress the right kidney. History of prior right nephrostomy tube placement for similar reasons on 04/11/2015. This nephrostomy tube was removed on 05/06/2015. EXAM: 1. ULTRASOUND GUIDANCE FOR PUNCTURE OF THE RIGHT RENAL COLLECTING SYSTEM. 2. RIGHT PERCUTANEOUS NEPHROSTOMY TUBE PLACEMENT. COMPARISON:  Renal ultrasound today prior nephrostomy tube placement imaging on 04/11/2015. ANESTHESIA/SEDATION: 2.0 mg IV Versed; 100 mcg IV Fentanyl.  Total Moderate Sedation Time 12 minutes. CONTRAST:  5.0 ml Omnipaque 300 MEDICATIONS: 3.375 g IV Zosyn. Antibiotic was administered in an appropriate time frame prior to skin puncture. FLUOROSCOPY TIME:  36 seconds. PROCEDURE: The procedure, risks, benefits, and alternatives were explained to the patient. Questions regarding the procedure were encouraged and answered. The patient understands and consents to the procedure. A time-out was performed prior to initiating the procedure. The right flank region was prepped with Betadine in a sterile fashion, and a sterile drape was applied covering the operative field. A sterile gown and sterile gloves were used for the procedure. Local anesthesia was provided with 1% Lidocaine. Ultrasound was used to localize the right kidney. Under direct ultrasound guidance, a 21 gauge needle was advanced into the renal collecting system. Ultrasound image documentation was performed. Aspiration of urine sample was performed followed by contrast injection. Aspirated urine sample was sent for culture analysis. A transitional dilator was advanced over a guidewire. Percutaneous tract dilatation was then performed over the guidewire. A 10 -French percutaneous nephrostomy tube was then advanced and formed in the collecting system. Catheter position was confirmed by fluoroscopy after contrast injection. The catheter was secured at the skin with a Prolene retention suture and Stat-Lock device. A gravity bag was placed. COMPLICATIONS: None. FINDINGS: Ultrasound demonstrates significant hydronephrosis. There was immediate return of grossly purulent fluid from the renal collecting system consistent with pyonephrosis. A 10 French nephrostomy tube was formed  at the level of the renal pelvis. This will be left to gravity drainage. IMPRESSION: Placement of 10 French right percutaneous nephrostomy tube. Fluid return from the collecting system was grossly purulent consistent with pyonephrosis. A  aspirated urine sample was sent for culture analysis. Electronically Signed   By: Aletta Edouard M.D.   On: 05/23/2015 14:48    Assessment/Plan:   Pyonephrosis of right kidney despite prior stent placement. Percutaneous tube placed yesterday. Patient should be improving with this drainage    We'll continue to follow-she will eventually need stent removal and antegrade nephrostogram.   LOS: 2 days   Franchot Gallo M 05/24/2015, 8:32 AM

## 2015-05-24 NOTE — Progress Notes (Signed)
Utilization review completed.  

## 2015-05-24 NOTE — Progress Notes (Signed)
Referring Physician(s): Dahlstedt  Chief Complaint: Urosepsis Nephrolithiasis Hydronephrosis S/P percutaneous nephrostomy tube placement by Dr. Kathlene Cote 05/23/2015  Subjective:  [name] states "I just don't feel good.Marland KitchenMarland KitchenI don't really know why" She says she doesn't feel much better at all today than yesterday She is not c/o pain at all.  No complaints regarding PCN.  Allergies: Vicodin and Percocet  Medications: Prior to Admission medications   Medication Sig Start Date End Date Taking? Authorizing Provider  acetaminophen (TYLENOL) 500 MG tablet Take 500 mg by mouth every 6 (six) hours as needed for moderate pain.   Yes Historical Provider, MD  acidophilus (RISAQUAD) CAPS capsule Take 2 capsules by mouth daily. 05/07/15  Yes Maryann Mikhail, DO  bimatoprost (LUMIGAN) 0.01 % SOLN Place 1 drop into both eyes at bedtime.   Yes Historical Provider, MD  ENSURE PLUS (ENSURE PLUS) LIQD Take 237 mLs by mouth daily as needed (nutritional supplement).    Yes Historical Provider, MD  Nutritional Supplements (FEEDING SUPPLEMENT, NEPRO CARB STEADY,) LIQD Take 237 mLs by mouth daily. 05/07/15  Yes Maryann Mikhail, DO  ranitidine (ZANTAC) 150 MG tablet Take 150 mg by mouth 2 (two) times daily as needed for heartburn.   Yes Historical Provider, MD  timolol (BETIMOL) 0.5 % ophthalmic solution Place 1 drop into both eyes every morning.    Yes Historical Provider, MD  amoxicillin-clavulanate (AUGMENTIN) 250-125 MG tablet Take 1 tablet by mouth 2 (two) times daily. Patient not taking: Reported on 05/20/2015 05/07/15   Velta Addison Mikhail, DO  HYDROcodone-acetaminophen (NORCO/VICODIN) 5-325 MG tablet Take 1 tablet by mouth 4 (four) times daily as needed for moderate pain.  04/27/15   Historical Provider, MD  saccharomyces boulardii (FLORASTOR) 250 MG capsule Take 1 capsule (250 mg total) by mouth 2 (two) times daily. Patient not taking: Reported on 05/22/2015 05/07/15   Velta Addison Mikhail, DO  sodium  chloride (OCEAN) 0.65 % SOLN nasal spray Place 1 spray into both nostrils 3 (three) times daily as needed for congestion.    Historical Provider, MD     Vital Signs: BP 105/61 mmHg  Pulse 108  Temp(Src) 103 F (39.4 C) (Oral)  Resp 23  Ht 5\' 3"  (1.6 m)  Wt 171 lb (77.565 kg)  BMI 30.30 kg/m2  SpO2 100%  LMP 01/19/1992  Physical Exam Awake and Alert Ill appearing Temp 103 Heart tachy Lungs clear Abdomen soft, NT PCN in place on the right, looks good Output ~1050 mls clear yellow urine   Imaging: Dg Chest 2 View  05/22/2015  CLINICAL DATA:  Sepsis. Shortness of breath. Chronic kidney disease. EXAM: CHEST  2 VIEW COMPARISON:  05/02/1967 chest radiograph. FINDINGS: Right internal jugular central venous catheter terminates in the lower third of the superior vena cava. Stable cardiomediastinal silhouette with normal heart size and retrocardiac lucency in keeping with known hiatal hernia. No pneumothorax. No pleural effusion. Slightly low lung volumes. No overt pulmonary edema. No focal lung opacity. Partially visualized is a left nephro ureteral stent. IMPRESSION: No active cardiopulmonary disease. Electronically Signed   By: Ilona Sorrel M.D.   On: 05/22/2015 19:52   US Renal  05/23/2015  CLINICAL DATA:  Pyelonephritis. Status post prior right percutaneous nephrostomy tube placement on 04/11/2015 for hydronephrosis. This nephrostomy tube was removed on 05/06/2015 and the patient has an indwelling right ureteral stent. EXAM: RENAL / URINARY TRACT ULTRASOUND COMPLETE COMPARISON:  CT of the abdomen on 05/02/2015 and prior renal ultrasound on 04/11/2015. FINDINGS: Right Kidney: Length: 11.6 cm. Moderate right  hydronephrosis present. Portion of the proximal and of a ureteral stent is visible in the renal pelvis. Re- development of hydronephrosis may implicate relative stent malfunction or occlusion. No focal renal lesions identified on the right. No focal perinephric fluid collections seen by  ultrasound. Left Kidney: Length: 9.4 cm. The left kidney demonstrates mild fullness of the collecting system without significant hydronephrosis. No focal renal lesion. Bladder: The bladder contains dependent debris. Distal portion of the right ureteral stent is visible in the bladder lumen. IMPRESSION: Moderate right hydronephrosis with indwelling right ureteral stent visualized. Re-development of hydronephrosis may implicate relative stent malfunction/occlusion. Electronically Signed   By: Aletta Edouard M.D.   On: 05/23/2015 10:00   Dg Chest Port 1 View  05/23/2015  CLINICAL DATA:  69-year-old female with fever. Patient denies chest complaint. EXAM: PORTABLE CHEST 1 VIEW COMPARISON:  Chest radiograph dated 05/22/2015 FINDINGS: A dialysis catheter is noted in stable positioning. There is an area of increased opacity in the left lung base may represent atelectatic changes. Pneumonia is not excluded. Clinical correlation recommended. Frontal and lateral views of the chest may provide better evaluation. There is no pleural effusion or pneumothorax. Stable cardiac silhouette. The osseous structures are grossly unremarkable. IMPRESSION: Left lung base subsegmental atelectasis. Developing pneumonia is not excluded. Clinical correlation is recommended. Electronically Signed   By: Anner Crete M.D.   On: 05/23/2015 23:24   Ir Nephrostomy Placement Right  05/23/2015  CLINICAL DATA:  Right-sided hydronephrosis with clinical evidence of pyelonephritis and urosepsis. Indwelling ureteral stent is present which is presumably obstructed. The patient requires emergent right-sided percutaneous nephrostomy to decompress the right kidney. History of prior right nephrostomy tube placement for similar reasons on 04/11/2015. This nephrostomy tube was removed on 05/06/2015. EXAM: 1. ULTRASOUND GUIDANCE FOR PUNCTURE OF THE RIGHT RENAL COLLECTING SYSTEM. 2. RIGHT PERCUTANEOUS NEPHROSTOMY TUBE PLACEMENT. COMPARISON:  Renal  ultrasound today prior nephrostomy tube placement imaging on 04/11/2015. ANESTHESIA/SEDATION: 2.0 mg IV Versed; 100 mcg IV Fentanyl. Total Moderate Sedation Time 12 minutes. CONTRAST:  5.0 ml Omnipaque 300 MEDICATIONS: 3.375 g IV Zosyn. Antibiotic was administered in an appropriate time frame prior to skin puncture. FLUOROSCOPY TIME:  36 seconds. PROCEDURE: The procedure, risks, benefits, and alternatives were explained to the patient. Questions regarding the procedure were encouraged and answered. The patient understands and consents to the procedure. A time-out was performed prior to initiating the procedure. The right flank region was prepped with Betadine in a sterile fashion, and a sterile drape was applied covering the operative field. A sterile gown and sterile gloves were used for the procedure. Local anesthesia was provided with 1% Lidocaine. Ultrasound was used to localize the right kidney. Under direct ultrasound guidance, a 21 gauge needle was advanced into the renal collecting system. Ultrasound image documentation was performed. Aspiration of urine sample was performed followed by contrast injection. Aspirated urine sample was sent for culture analysis. A transitional dilator was advanced over a guidewire. Percutaneous tract dilatation was then performed over the guidewire. A 10 -French percutaneous nephrostomy tube was then advanced and formed in the collecting system. Catheter position was confirmed by fluoroscopy after contrast injection. The catheter was secured at the skin with a Prolene retention suture and Stat-Lock device. A gravity bag was placed. COMPLICATIONS: None. FINDINGS: Ultrasound demonstrates significant hydronephrosis. There was immediate return of grossly purulent fluid from the renal collecting system consistent with pyonephrosis. A 10 French nephrostomy tube was formed at the level of the renal pelvis. This will be left to gravity  drainage. IMPRESSION: Placement of 10 French right  percutaneous nephrostomy tube. Fluid return from the collecting system was grossly purulent consistent with pyonephrosis. A aspirated urine sample was sent for culture analysis. Electronically Signed   By: Aletta Edouard M.D.   On: 05/23/2015 14:48    Labs:  CBC:  Recent Labs  05/06/15 0520  05/22/15 0947 05/22/15 1857 05/23/15 0602 05/24/15 0522  WBC 8.5  --   --  22.2* 27.9* 28.6*  HGB 9.7*  < > 11.1* 12.9 10.9* 10.1*  HCT 31.6*  --   --  41.4 34.8* 33.1*  PLT 200  --   --  367 330 303  < > = values in this interval not displayed.  COAGS:  Recent Labs  11/23/14 0305 02/26/15 0015 05/04/15 0435 05/23/15 1450  INR 1.31 1.17 1.24 1.30  APTT  --  33  --   --     BMP:  Recent Labs  05/07/15 0529 05/22/15 1857 05/23/15 0602 05/24/15 0522  NA 137 134* 132* 131*  K 3.9 5.2* 5.4* 5.4*  CL 109 105 106 108  CO2 20* 16* 15* 10*  GLUCOSE 105* 144* 143* 107*  BUN 39* 97* 99* 94*  CALCIUM 9.1 9.6 8.7* 8.1*  CREATININE 5.23* 6.58* 7.11* 6.73*  GFRNONAA 8* 6* 5* 6*  GFRAA 9* 7* 6* 6*    LIVER FUNCTION TESTS:  Recent Labs  05/06/15 0520 05/22/15 1857 05/23/15 0602 05/24/15 0522  BILITOT 0.4 0.7 0.5 0.4  AST 10* 21 22 23   ALT 13* 19 21 21   ALKPHOS 63 107 89 75  PROT 6.1* 7.5 6.2* 5.5*  ALBUMIN 2.6* 3.4* 2.7* 2.4*    Assessment and Plan:  Urosepsis Hydronephrosis secondary to nephrolitiasis S/P perc nephrostomy tube 05/23/2015 by Dr. Kathlene Cote Continue care per Urology, Nephrology, and Hospitalist   Signed: Murrell Redden PA-C 05/24/2015, 8:48 AM   I spent a total of 15 Minutes at the the patient's bedside AND on the patient's hospital floor or unit, greater than 50% of which was counseling/coordinating care for perc nephrostomy tube

## 2015-05-24 NOTE — Progress Notes (Signed)
[facility] PROGRESS NOTE  [name] [id number] DOB: Jan 28, 1946 DOA: 05/22/2015 PCP: [name], MD Brief narrative 69 year old female with recurrent peripheral lithiasis with intermittent bilateral hydronephrosis requiring ureteral stent and right-sided nephrostomy, CKD stage V (has HD catheter,  scheduled for AV fistula placement next week) was recently hospitalized for sepsis secondary to pyelonephritis and underwent removal of right percutaneous nephrostomy and lateral double J stent placement with stent exchange and discharged home on antibiotics. Patient had nephrostomy tube placement on 04/11/2015 which was internalized right indwelling ureteral stent and nephrostomy tube was removed on 05/06/2015 during her recent hospitalization. Patient returned to the ED 12/2 with fevers with chills and temperature 103F, poor by mouth intake with dysuria, increased urinary frequency and mild right flank pain. She also found to have acute on chronic kidney disease with creatinine elevated up to 7.1. Patient admitted to hospitalist service for sepsis. Renal ultrasound done showing moderate right hydronephrosis with ureteral stent within the renal pelvis and bladder concerning for ureteral stent failure. Patient seen by urology and consulted IR for urgent right nephrostomy tube which was placed on 12/3.   Assessment/Plan: Severe sepsis Secondary to UTI/pyelonephritis with persistent hydronephrosis. - aggressive IV hydration. Place foley to decompress the bladder. Follow blood and urine culture.  -May need long-term suppressive antibiotics given recurrent urosepsis -Patient having persistent fever and leukocytosis. Antibiotic broadened (vancomycin and Zosyn). Follow culture. Patient also having diarrhea and has history of C. difficile. Stool for C. difficile ordered.  Persistent hydronephrosis with recurrent urosepsis Renal ultrasound showing persistent hydronephrosis despite  ureteral stent with good positioning.  emergent right nephrostomy tube placement by IR given ureteral stent failure. Recommend removal of the ureteral stent once sepsis results. Monitor renal function and urine output. Appreciate urology recommendations.  Acute on chronic kidney disease stage V with metabolic acidosis Possibly associated with dehydration and sepsis with associated hydronephrosis. Follows with Dr. Florene Glen. Has HD  catheter placed during prior hospitalization. ( received only 1 session of dialysis during that time was present. For AV fistula placement this week.  No uremic signs or symptoms. Creatinine slightly better this morning. Monitor strict I/O -Low bicarbonate noted. Ordered grams of IV bicarbonate. Renal consult appreciated.  Anemia of chronic disease Hemoglobin stable  Chronic diastolic CHF Patient is hypovolemic.  Hyperkalemia Monitor on telemetry.  GERD Continue PPI  Protein calorie malnutrition Continue supplement  DVT prophylaxis: SCD Diet: Nothing by mouth  Code Status: Full code Family Communication: Daughter at bedside Disposition Plan: Continue telemetry monitoring.    Consultants:  Urology  IR  Nephrology  Procedures:  Renal ultrasound   right nephrostomy by IR on 12/3  Antibiotics:  IV Rocephin 1  IV Zosyn and vancomycin 12/3-  HPI/Subjective: Seen and examined. He needs to have temperature spikes. Complains of 2-3 episodes of diarrhea as overnight. Denies any pain over nephrostomy site.  Objective: Filed Vitals:   05/24/15 0600 05/24/15 0753  BP: 105/61   Pulse: 108   Temp: 97.9 F (36.6 C) 103 F (39.4 C)  Resp: 23     Intake/Output Summary (Last 24 hours) at 05/24/15 1211 Last data filed at 05/24/15 0600  Gross per 24 hour  Intake 3498.33 ml  Output    850 ml  Net 2648.33 ml   Filed Weights   05/22/15 2012  Weight: 77.565 kg (171 lb)    Exam:   General:  Elderly female lying in bed,  fatigued  HEENT: Pallor present, dry oral mucosa, supple neck  Chest: Clear bilaterally  CVS: Normal S1 and S2, no murmurs rub or gallop  GI: Soft, nondistended, nontender, bowel sounds present, no flank tenderness, right nephrostomy site clean and draining clear urine. Foley placed on 12/3  Musculoskeletal: Warm, no edema  CNS: Alert and oriented  Data Reviewed: Basic Metabolic Panel:  Recent Labs Lab 05/22/15 1857 05/23/15 0602 05/24/15 0522  NA 134* 132* 131*  K 5.2* 5.4* 5.4*  CL 105 106 108  CO2 16* 15* 10*  GLUCOSE 144* 143* 107*  BUN 97* 99* 94*  CREATININE 6.58* 7.11* 6.73*  CALCIUM 9.6 8.7* 8.1*  MG  --  1.9  --    Liver Function Tests:  Recent Labs Lab 05/22/15 1857 05/23/15 0602 05/24/15 0522  AST 21 22 23   ALT 19 21 21   ALKPHOS 107 89 75  BILITOT 0.7 0.5 0.4  PROT 7.5 6.2* 5.5*  ALBUMIN 3.4* 2.7* 2.4*   No results for input(s): LIPASE, AMYLASE in the last 168 hours. No results for input(s): AMMONIA in the last 168 hours. CBC:  Recent Labs Lab 05/22/15 0947 05/22/15 1857 05/23/15 0602 05/24/15 0522  WBC  --  22.2* 27.9* 28.6*  NEUTROABS  --  21.1* 26.8* 28.0*  HGB 11.1* 12.9 10.9* 10.1*  HCT  --  41.4 34.8* 33.1*  MCV  --  90.2 89.5 91.2  PLT  --  367 330 303   Cardiac Enzymes: No results for input(s): CKTOTAL, CKMB, CKMBINDEX, TROPONINI in the last 168 hours. BNP (last 3 results)  Recent Labs  02/26/15 0530 05/23/15 0602  BNP 35.3 37.2    ProBNP (last 3 results) No results for input(s): PROBNP in the last 8760 hours.  CBG: No results for input(s): GLUCAP in the last 168 hours.  Recent Results (from the past 240 hour(s))  Culture, blood (routine x 2)     Status: None (Preliminary result)   Collection Time: 05/22/15  6:58 PM  Result Value Ref Range Status   Specimen Description BLOOD RIGHT ANTECUBITAL  Final   Special Requests IN PEDIATRIC BOTTLE 4 ML  Final   Culture   Final    NO GROWTH < 24 HOURS Performed at [facility]    Report Status PENDING  Incomplete  Culture, blood (routine x 2)     Status: None (Preliminary result)   Collection Time: 05/22/15  7:02 PM  Result Value Ref Range Status   Specimen Description BLOOD RIGHT HAND  Final   Special Requests IN PEDIATRIC BOTTLE 3 CC  Final   Culture   Final    NO GROWTH < 24 HOURS Performed at [facility]    Report Status PENDING  Incomplete  Urine culture     Status: None (Preliminary result)   Collection Time: 05/22/15  7:56 PM  Result Value Ref Range Status   Specimen Description URINE, CLEAN CATCH  Final   Special Requests NONE  Final   Culture   Final    TOO YOUNG TO READ Performed at [facility]    Report Status PENDING  Incomplete  Urine culture     Status: None (Preliminary result)   Collection Time: 05/23/15  2:01 PM  Result Value Ref Range Status   Specimen Description KIDNEY RIGHT  Final   Special Requests NONE  Final   Culture   Final    TOO YOUNG TO READ Performed at [facility]    Report Status PENDING  Incomplete     Studies: Dg Chest 2 View  05/22/2015  CLINICAL DATA:  Sepsis. Shortness of breath. Chronic kidney disease. EXAM: CHEST  2 VIEW COMPARISON:  05/02/1967 chest radiograph. FINDINGS: Right internal jugular central venous catheter terminates in the lower third of the superior vena cava. Stable cardiomediastinal silhouette with normal heart size and retrocardiac lucency in keeping with known hiatal hernia. No pneumothorax. No pleural effusion. Slightly low lung volumes. No overt pulmonary edema. No focal lung opacity. Partially visualized is a left nephro ureteral stent. IMPRESSION: No active cardiopulmonary disease. Electronically Signed   By: Ilona Sorrel M.D.   On: 05/22/2015 19:52   US Renal  05/23/2015  CLINICAL DATA:  Pyelonephritis. Status post prior right percutaneous nephrostomy tube placement on 04/11/2015 for hydronephrosis. This nephrostomy tube was removed on 05/06/2015 and  the patient has an indwelling right ureteral stent. EXAM: RENAL / URINARY TRACT ULTRASOUND COMPLETE COMPARISON:  CT of the abdomen on 05/02/2015 and prior renal ultrasound on 04/11/2015. FINDINGS: Right Kidney: Length: 11.6 cm. Moderate right hydronephrosis present. Portion of the proximal and of a ureteral stent is visible in the renal pelvis. Re- development of hydronephrosis may implicate relative stent malfunction or occlusion. No focal renal lesions identified on the right. No focal perinephric fluid collections seen by ultrasound. Left Kidney: Length: 9.4 cm. The left kidney demonstrates mild fullness of the collecting system without significant hydronephrosis. No focal renal lesion. Bladder: The bladder contains dependent debris. Distal portion of the right ureteral stent is visible in the bladder lumen. IMPRESSION: Moderate right hydronephrosis with indwelling right ureteral stent visualized. Re-development of hydronephrosis may implicate relative stent malfunction/occlusion. Electronically Signed   By: Aletta Edouard M.D.   On: 05/23/2015 10:00   Dg Chest Port 1 View  05/23/2015  CLINICAL DATA:  71-year-old female with fever. Patient denies chest complaint. EXAM: PORTABLE CHEST 1 VIEW COMPARISON:  Chest radiograph dated 05/22/2015 FINDINGS: A dialysis catheter is noted in stable positioning. There is an area of increased opacity in the left lung base may represent atelectatic changes. Pneumonia is not excluded. Clinical correlation recommended. Frontal and lateral views of the chest may provide better evaluation. There is no pleural effusion or pneumothorax. Stable cardiac silhouette. The osseous structures are grossly unremarkable. IMPRESSION: Left lung base subsegmental atelectasis. Developing pneumonia is not excluded. Clinical correlation is recommended. Electronically Signed   By: Anner Crete M.D.   On: 05/23/2015 23:24   Ir Nephrostomy Placement Right  05/23/2015  CLINICAL DATA:   Right-sided hydronephrosis with clinical evidence of pyelonephritis and urosepsis. Indwelling ureteral stent is present which is presumably obstructed. The patient requires emergent right-sided percutaneous nephrostomy to decompress the right kidney. History of prior right nephrostomy tube placement for similar reasons on 04/11/2015. This nephrostomy tube was removed on 05/06/2015. EXAM: 1. ULTRASOUND GUIDANCE FOR PUNCTURE OF THE RIGHT RENAL COLLECTING SYSTEM. 2. RIGHT PERCUTANEOUS NEPHROSTOMY TUBE PLACEMENT. COMPARISON:  Renal ultrasound today prior nephrostomy tube placement imaging on 04/11/2015. ANESTHESIA/SEDATION: 2.0 mg IV Versed; 100 mcg IV Fentanyl. Total Moderate Sedation Time 12 minutes. CONTRAST:  5.0 ml Omnipaque 300 MEDICATIONS: 3.375 g IV Zosyn. Antibiotic was administered in an appropriate time frame prior to skin puncture. FLUOROSCOPY TIME:  36 seconds. PROCEDURE: The procedure, risks, benefits, and alternatives were explained to the patient. Questions regarding the procedure were encouraged and answered. The patient understands and consents to the procedure. A time-out was performed prior to initiating the procedure. The right flank region was prepped with Betadine in a sterile fashion, and a sterile drape was applied covering the operative field. A sterile gown  and sterile gloves were used for the procedure. Local anesthesia was provided with 1% Lidocaine. Ultrasound was used to localize the right kidney. Under direct ultrasound guidance, a 21 gauge needle was advanced into the renal collecting system. Ultrasound image documentation was performed. Aspiration of urine sample was performed followed by contrast injection. Aspirated urine sample was sent for culture analysis. A transitional dilator was advanced over a guidewire. Percutaneous tract dilatation was then performed over the guidewire. A 10 -French percutaneous nephrostomy tube was then advanced and formed in the collecting system. Catheter  position was confirmed by fluoroscopy after contrast injection. The catheter was secured at the skin with a Prolene retention suture and Stat-Lock device. A gravity bag was placed. COMPLICATIONS: None. FINDINGS: Ultrasound demonstrates significant hydronephrosis. There was immediate return of grossly purulent fluid from the renal collecting system consistent with pyonephrosis. A 10 French nephrostomy tube was formed at the level of the renal pelvis. This will be left to gravity drainage. IMPRESSION: Placement of 10 French right percutaneous nephrostomy tube. Fluid return from the collecting system was grossly purulent consistent with pyonephrosis. A aspirated urine sample was sent for culture analysis. Electronically Signed   By: Aletta Edouard M.D.   On: 05/23/2015 14:48    Scheduled Meds: . [START ON 05/25/2015] famotidine  20 mg Oral Daily  . feeding supplement (NEPRO CARB STEADY)  237 mL Oral Q24H  . latanoprost  1 drop Both Eyes QHS  . pantoprazole  40 mg Oral Daily  . piperacillin-tazobactam (ZOSYN)  IV  2.25 g Intravenous Q8H  . saccharomyces boulardii  250 mg Oral BID  . sodium bicarbonate  650 mg Oral BID  . sodium chloride  3 mL Intravenous Q12H  . timolol  1 drop Both Eyes q morning - 10a   Continuous Infusions: .  sodium bicarbonate 150 mEq in sterile water 1000 mL infusion 150 mL/hr at 05/24/15 0921      Time spent: 35 minutes    Marko Skalski, [place]  [facility] Pager [phone]. If 7PM-7AM, please contact night-coverage at [email or web address], password [facility] 05/24/2015, 12:11 PM  LOS: 2 days

## 2015-05-25 ENCOUNTER — Other Ambulatory Visit: Payer: Self-pay

## 2015-05-25 DIAGNOSIS — E876 Hypokalemia: Secondary | ICD-10-CM

## 2015-05-25 LAB — COMPREHENSIVE METABOLIC PANEL
ALT: 24 U/L (ref 14–54)
AST: 27 U/L (ref 15–41)
Albumin: 1.8 g/dL — ABNORMAL LOW (ref 3.5–5.0)
Alkaline Phosphatase: 61 U/L (ref 38–126)
Anion gap: 11 (ref 5–15)
BUN: 84 mg/dL — ABNORMAL HIGH (ref 6–20)
CO2: 23 mmol/L (ref 22–32)
Calcium: 7.5 mg/dL — ABNORMAL LOW (ref 8.9–10.3)
Chloride: 99 mmol/L — ABNORMAL LOW (ref 101–111)
Creatinine, Ser: 6.05 mg/dL — ABNORMAL HIGH (ref 0.44–1.00)
GFR calc Af Amer: 7 mL/min — ABNORMAL LOW (ref >60)
GFR calc non Af Amer: 6 mL/min — ABNORMAL LOW (ref >60)
Glucose, Bld: 126 mg/dL — ABNORMAL HIGH (ref 65–99)
Potassium: 3.4 mmol/L — ABNORMAL LOW (ref 3.5–5.1)
Sodium: 133 mmol/L — ABNORMAL LOW (ref 135–145)
Total Bilirubin: 0.7 mg/dL (ref 0.3–1.2)
Total Protein: 4.7 g/dL — ABNORMAL LOW (ref 6.5–8.1)

## 2015-05-25 LAB — CBC WITH DIFFERENTIAL/PLATELET
Basophils Absolute: 0 10*3/uL (ref 0.0–0.1)
Basophils Relative: 0 %
Eosinophils Absolute: 0.1 10*3/uL (ref 0.0–0.7)
Eosinophils Relative: 1 %
HCT: 29.1 % — ABNORMAL LOW (ref 36.0–46.0)
Hemoglobin: 9.4 g/dL — ABNORMAL LOW (ref 12.0–15.0)
Lymphocytes Relative: 4 %
Lymphs Abs: 0.4 10*3/uL — ABNORMAL LOW (ref 0.7–4.0)
MCH: 28 pg (ref 26.0–34.0)
MCHC: 32.3 g/dL (ref 30.0–36.0)
MCV: 86.6 fL (ref 78.0–100.0)
Monocytes Absolute: 0.1 10*3/uL (ref 0.1–1.0)
Monocytes Relative: 1 %
Neutro Abs: 8.3 10*3/uL — ABNORMAL HIGH (ref 1.7–7.7)
Neutrophils Relative %: 94 %
Platelets: 199 10*3/uL (ref 150–400)
RBC: 3.36 MIL/uL — ABNORMAL LOW (ref 3.87–5.11)
RDW: 17 % — ABNORMAL HIGH (ref 11.5–15.5)
WBC: 8.9 10*3/uL (ref 4.0–10.5)

## 2015-05-25 LAB — URINE CULTURE
Culture: >=100000
Culture: >=100000

## 2015-05-25 LAB — MAGNESIUM: Magnesium: 1.6 mg/dL — ABNORMAL LOW (ref 1.7–2.4)

## 2015-05-25 LAB — VANCOMYCIN, RANDOM: Vancomycin Rm: 18 ug/mL

## 2015-05-25 MED ORDER — DILTIAZEM HCL 25 MG/5ML IV SOLN
5.0000 mg | Freq: Once | INTRAVENOUS | Status: AC
  Administered 2015-05-25: 5 mg via INTRAVENOUS
  Filled 2015-05-25: qty 5

## 2015-05-25 MED ORDER — MAGNESIUM SULFATE 2 GM/50ML IV SOLN
2.0000 g | Freq: Once | INTRAVENOUS | Status: AC
  Administered 2015-05-25: 2 g via INTRAVENOUS
  Filled 2015-05-25: qty 50

## 2015-05-25 MED ORDER — DEXTROMETHORPHAN POLISTIREX ER 30 MG/5ML PO SUER
15.0000 mg | Freq: Three times a day (TID) | ORAL | Status: DC | PRN
  Filled 2015-05-25: qty 5

## 2015-05-25 MED ORDER — SODIUM CHLORIDE 0.9 % IV BOLUS (SEPSIS)
1000.0000 mL | Freq: Once | INTRAVENOUS | Status: AC
  Administered 2015-05-25: 1000 mL via INTRAVENOUS

## 2015-05-25 MED ORDER — DIPHENOXYLATE-ATROPINE 2.5-0.025 MG/5ML PO LIQD
10.0000 mL | Freq: Four times a day (QID) | ORAL | Status: DC | PRN
  Administered 2015-05-25 – 2015-05-29 (×9): 10 mL via ORAL
  Filled 2015-05-25 (×8): qty 10

## 2015-05-25 MED ORDER — SODIUM CHLORIDE 0.9 % IV SOLN
INTRAVENOUS | Status: AC
  Administered 2015-05-25: 14:00:00 via INTRAVENOUS

## 2015-05-25 MED ORDER — SODIUM CHLORIDE 0.9 % IV SOLN
500.0000 mg | Freq: Once | INTRAVENOUS | Status: AC
  Administered 2015-05-25: 500 mg via INTRAVENOUS
  Filled 2015-05-25: qty 500

## 2015-05-25 NOTE — Progress Notes (Signed)
ANTIBIOTIC CONSULT NOTE - INITIAL  Pharmacy Consult for adding vancomycin to zosyn Indication: rule out sepsis  Allergies  Allergen Reactions  . Vicodin [Hydrocodone-Acetaminophen] Nausea And Vomiting  . Percocet [Oxycodone-Acetaminophen] Nausea And Vomiting    Patient Measurements: Height: 5\' 3"  (160 cm) Weight: 171 lb (77.565 kg) IBW/kg (Calculated) : 52.4  Vital Signs: Temp: 97.8 F (36.6 C) (12/05 0607) Temp Source: Oral (12/05 0607) BP: 92/61 mmHg (12/05 0607) Pulse Rate: 94 (12/05 0607) Intake/Output from previous day: 12/04 0701 - 12/05 0700 In: 1980 [P.O.:180; I.V.:1800] Out: 1750 [Urine:1750] Intake/Output from this shift: Total I/O In: 180 [P.O.:180] Out: 1000 [Urine:1000]  Labs:  Recent Labs  05/23/15 0602 05/24/15 0522 05/25/15 0515  WBC 27.9* 28.6* 8.9  HGB 10.9* 10.1* 9.4*  PLT 330 303 199  CREATININE 7.11* 6.73* 6.05*   Estimated Creatinine Clearance: 8.5 mL/min (by C-G formula based on Cr of 6.05).  Recent Labs  05/25/15 0515  [facility] 18     Microbiology: Recent Results (from the past 720 hour(s))  Culture, blood (routine x 2)     Status: None   Collection Time: 05/02/15  6:18 PM  Result Value Ref Range Status   Specimen Description BLOOD LEFT FOREARM  7 ML IN YELLOW BOTTLE  Final   Special Requests NONE  Final   Culture   Final    NO GROWTH 5 DAYS Performed at [facility]    Report Status 05/08/2015 FINAL  Final  Culture, blood (routine x 2)     Status: None   Collection Time: 05/02/15  6:25 PM  Result Value Ref Range Status   Specimen Description BLOOD RIGHT ARM  2 ML IN YELLOW BOTTLE  Final   Special Requests NONE  Final   Culture   Final    NO GROWTH 5 DAYS Performed at [facility]    Report Status 05/08/2015 FINAL  Final  Urine culture     Status: None   Collection Time: 05/02/15  8:20 PM  Result Value Ref Range Status   Specimen Description URINE, CLEAN CATCH  Final   Special Requests NONE  Final   Culture   Final    MULTIPLE SPECIES PRESENT, SUGGEST RECOLLECTION Performed at [facility]    Report Status 05/04/2015 FINAL  Final  Culture, blood (routine x 2)     Status: None (Preliminary result)   Collection Time: 05/22/15  6:58 PM  Result Value Ref Range Status   Specimen Description BLOOD RIGHT ANTECUBITAL  Final   Special Requests IN PEDIATRIC BOTTLE 4 ML  Final   Culture   Final    NO GROWTH 2 DAYS Performed at [facility]    Report Status PENDING  Incomplete  Culture, blood (routine x 2)     Status: None (Preliminary result)   Collection Time: 05/22/15  7:02 PM  Result Value Ref Range Status   Specimen Description BLOOD RIGHT HAND  Final   Special Requests IN PEDIATRIC BOTTLE 3 CC  Final   Culture   Final    NO GROWTH 2 DAYS Performed at [facility]    Report Status PENDING  Incomplete  Urine culture     Status: None (Preliminary result)   Collection Time: 05/22/15  7:56 PM  Result Value Ref Range Status   Specimen Description URINE, CLEAN CATCH  Final   Special Requests NONE  Final   Culture   Final    >=100,000 COLONIES/mL STAPHYLOCOCCUS AUREUS Performed at [facility]  Report Status PENDING  Incomplete  Urine culture     Status: None (Preliminary result)   Collection Time: 05/23/15  2:01 PM  Result Value Ref Range Status   Specimen Description KIDNEY RIGHT  Final   Special Requests NONE  Final   Culture   Final    TOO YOUNG TO READ Performed at [facility]    Report Status PENDING  Incomplete  Culture, blood (routine x 2)     Status: None (Preliminary result)   Collection Time: 05/23/15  9:26 PM  Result Value Ref Range Status   Specimen Description BLOOD RIGHT WRIST  Final   Special Requests   Final    BOTTLES DRAWN AEROBIC AND ANAEROBIC 10 CC BOTH BOTTLES   Culture   Final    NO GROWTH < 24 HOURS Performed at [facility]    Report Status PENDING  Incomplete  Culture, blood (routine x 2)     Status:  None (Preliminary result)   Collection Time: 05/23/15  9:30 PM  Result Value Ref Range Status   Specimen Description BLOOD RIGHT HAND  Final   Special Requests BOTTLES DRAWN AEROBIC ONLY 10 CC  Final   Culture   Final    NO GROWTH < 24 HOURS Performed at [facility]    Report Status PENDING  Incomplete  C difficile quick scan w PCR reflex     Status: Abnormal   Collection Time: 05/24/15  7:27 PM  Result Value Ref Range Status   C Diff antigen POSITIVE (A) NEGATIVE Final   C Diff toxin NEGATIVE NEGATIVE Final   C Diff interpretation   Final    C. difficile present, but toxin not detected. This indicates colonization. In most cases, this does not require treatment. If patient has signs and symptoms consistent with colitis, consider treatment. Requires ENTERIC precautions.    Medical History: Past Medical History  Diagnosis Date  . GERD (gastroesophageal reflux disease)   . Glaucoma   . H/O renal calculi 2002 & 2006  . H/O hiatal hernia   . Headache(784.0)     migraine  . Pneumonia     dx 10-06-2014 per CXR--  on 10-27-2014 pt states finished antibiotic and denies cough or fever  . Hyperlipidemia   . History of MI (myocardial infarction)     10/ 2013 in setting of Septic Shock  . History of atrial fibrillation     10/ 2013  in setting of Septic Shock  . History of CHF (congestive heart failure)     10/ 2013 in setting of septic shock  . Sigmoid diverticulosis   . Nephrolithiasis     bilateral  . CKD (chronic kidney disease), stage III     nephrologist-  dr Florene Glen  . Right ureteral stone   . History of acute respiratory failure     10/ 2013  -- ventilated in setting septic shock  . Dysrhythmia     Afib in 2013 when she had septic shock related to stone ureteral obstruction  . Myocardial infarction [facility])     Was reported in 2013 during hospitalization with septic shock  . Septic shock ([place]) 04/04/2012  . Sepsis ([place])   . Complication of anesthesia     use a  little anesthesia , per patient MD states she quit breathing   . Hypertension     medication removed from regimen due to low blood pressure   . Peripheral vascular disease ([place])   . S/P hemodialysis  catheter insertion ([place]) 04/11/2015     right anterior chest , only used once   . History of nephrostomy 04/11/2015     currently inplace 04/28/2015   . History of blood transfusion 04/13/2015    Assessment: Patient's a 69 y.o F with hx CKD stage 3 (being worked up outpatient for AVF placement) nephrolithiasis, bilateral hydronephrosis (status post nephrostomy in Nov 2016) who presented to the ED on 12/2 with c/o fever/fatigue and dysuria. UA was consistent with UTI.  Ceftriaxone started on admission.  Patient remains febrile with wbc trending up -- to change abx to zosyn for broad coverage.  - Tmax 103.1, wbc up 27.9; CKD stage 3, scr up 7.11 (crcl~<10) - LA 0.90 on 12/02 - 12/2 CXR: no active dz  12/2 CTX>> 12/3 12/3 zosyn>> 12/3 vancomycin >>  12/02 ucx:  12/02 bcx x2: NGTD 12/02 UA: amber color, turbid, >300 protein, positive nitrite and leukocytes  Today, 12/5 0500 VancR=18 after 1500 mg x1 12/3 @ 2130 (~32 hours) Scr improving slightly 6.73>6.05   Goal of Therapy:  Eradication of infection Vancomycin trough 15-23mcg/ml Plan:  - vancomycin 500mg  IV x1  - check random level 12/7 am with am labs   - dose off random levels for now until SCr stabilizes (s/p nephrostomy tube 12/3) - Continue zosyn 2.25 gm IV q8h - f/u cx and renal function   Dorrene German 05/25/2015 6:15 AM

## 2015-05-25 NOTE — Care Management Important Message (Signed)
Important Message  Patient Details  Name: [name] MRN: [id number] Date of Birth: [birth year]   Medicare Important Message Given:  Yes    [name] 05/25/2015, 1:33 [place] Message  Patient Details  Name: [name] MRN: [id number] Date of Birth: 21-Dec-1945   Medicare Important Message Given:  Yes    [name] 05/25/2015, 1:33 PM

## 2015-05-25 NOTE — Progress Notes (Signed)
Pts temp at 103 and HR IN 150's per tele monitoring. Tylenol given, ice packs applied to axilla and groin and blankets removed. MD notified, new orders for NS bolus and EKG. Will continue to monitor pt and intervene appropriately.

## 2015-05-25 NOTE — Progress Notes (Signed)
Subjective: Urology f/u: well known to urology: Pt has solitary Right kidney with chronic obstruction and right JJ stent, now changed twicce since September, 2016. She has a baseline Cr 4.6, and has prn pyelonephritis epwith increase in cr to 7.5. She does improve with right perc nephrostomy, with decrease in her Creatinine, and has been able to avoid permanent dialysis to date.    She most recerntly had right JJ stent change, with plug of her right perc tube stent, and, after several week observation of stable creatinine, had right perc tube removed-only to return to hospital for repeat perc tube placement for elevated creatinine.Cr. today decreased to 5 range, but not down to her baseline of 4.5.    She has received multiple broad spectrum antibiotics,  And now has a + C. Diff assay.    Nephrology consultation expected today.   Objective: Vital signs in last 24 hours: Temp:  [97.8 F (36.6 C)-103 F (39.4 C)] 97.8 F (36.6 C) (12/05 0607) Pulse Rate:  [94-119] 94 (12/05 0607) Resp:  [19-21] 19 (12/05 0607) BP: (92-114)/(59-89) 92/61 mmHg (12/05 0607) SpO2:  [94 %-99 %] 99 % (12/05 0607)A  Intake/Output from previous day: 12/04 0701 - 12/05 0700 In: 3710 [P.O.:180; I.V.:3530] Out: 1750 [Urine:1750] Intake/Output this shift:    Past Medical History  Diagnosis Date  . GERD (gastroesophageal reflux disease)   . Glaucoma   . H/O renal calculi 2002 & 2006  . H/O hiatal hernia   . Headache(784.0)     migraine  . Pneumonia     dx 10-06-2014 per CXR--  on 10-27-2014 pt states finished antibiotic and denies cough or fever  . Hyperlipidemia   . History of MI (myocardial infarction)     10/ 2013 in setting of Septic Shock  . History of atrial fibrillation     10/ 2013  in setting of Septic Shock  . History of CHF (congestive heart failure)     10/ 2013 in setting of septic shock  . Sigmoid diverticulosis   . Nephrolithiasis     bilateral  . CKD (chronic kidney disease), stage III      nephrologist-  dr Florene Glen  . Right ureteral stone   . History of acute respiratory failure     10/ 2013  -- ventilated in setting septic shock  . Dysrhythmia     Afib in 2013 when she had septic shock related to stone ureteral obstruction  . Myocardial infarction [facility])     Was reported in 2013 during hospitalization with septic shock  . Septic shock ([place]) 04/04/2012  . Sepsis ([place])   . Complication of anesthesia     use a little anesthesia , per patient MD states she quit breathing   . Hypertension     medication removed from regimen due to low blood pressure   . Peripheral vascular disease ([place])   . S/P hemodialysis catheter insertion ([place]) 04/11/2015     right anterior chest , only used once   . History of nephrostomy 04/11/2015     currently inplace 04/28/2015   . History of blood transfusion 04/13/2015     Physical Exam:  Lungs - Normal respiratory effort, chest expands symmetrically.  Abdomen - Soft, non-tender & non-distended.  Lab Results:  Recent Labs  05/23/15 0602 05/24/15 0522 05/25/15 0515  WBC 27.9* 28.6* 8.9  HGB 10.9* 10.1* 9.4*  HCT 34.8* 33.1* 29.1*   BMET  Recent Labs  05/24/15 0522 05/25/15 0515  NA 131* 133*  K 5.4* 3.4*  CL 108 99*  CO2 10* 23  GLUCOSE 107* 126*  BUN 94* 84*  CREATININE 6.73* 6.05*  CALCIUM 8.1* 7.5*   No results for input(s): LABURIN in the last 72 hours. Results for orders placed or performed during the hospital encounter of 05/22/15  Culture, blood (routine x 2)     Status: None (Preliminary result)   Collection Time: 05/22/15  6:58 PM  Result Value Ref Range Status   Specimen Description BLOOD RIGHT ANTECUBITAL  Final   Special Requests IN PEDIATRIC BOTTLE 4 ML  Final   Culture   Final    NO GROWTH 2 DAYS Performed at [facility]    Report Status PENDING  Incomplete  Culture, blood (routine x 2)     Status: None (Preliminary result)   Collection Time: 05/22/15  7:02 PM  Result Value Ref Range Status    Specimen Description BLOOD RIGHT HAND  Final   Special Requests IN PEDIATRIC BOTTLE 3 CC  Final   Culture   Final    NO GROWTH 2 DAYS Performed at [facility]    Report Status PENDING  Incomplete  Urine culture     Status: None (Preliminary result)   Collection Time: 05/22/15  7:56 PM  Result Value Ref Range Status   Specimen Description URINE, CLEAN CATCH  Final   Special Requests NONE  Final   Culture   Final    >=100,000 COLONIES/mL STAPHYLOCOCCUS AUREUS Performed at [facility]    Report Status PENDING  Incomplete  Urine culture     Status: None (Preliminary result)   Collection Time: 05/23/15  2:01 PM  Result Value Ref Range Status   Specimen Description KIDNEY RIGHT  Final   Special Requests NONE  Final   Culture   Final    TOO YOUNG TO READ Performed at [facility]    Report Status PENDING  Incomplete  Culture, blood (routine x 2)     Status: None (Preliminary result)   Collection Time: 05/23/15  9:26 PM  Result Value Ref Range Status   Specimen Description BLOOD RIGHT WRIST  Final   Special Requests   Final    BOTTLES DRAWN AEROBIC AND ANAEROBIC 10 CC BOTH BOTTLES   Culture   Final    NO GROWTH < 24 HOURS Performed at [facility]    Report Status PENDING  Incomplete  Culture, blood (routine x 2)     Status: None (Preliminary result)   Collection Time: 05/23/15  9:30 PM  Result Value Ref Range Status   Specimen Description BLOOD RIGHT HAND  Final   Special Requests BOTTLES DRAWN AEROBIC ONLY 10 CC  Final   Culture   Final    NO GROWTH < 24 HOURS Performed at [facility]    Report Status PENDING  Incomplete  C difficile quick scan w PCR reflex     Status: Abnormal   Collection Time: 05/24/15  7:27 PM  Result Value Ref Range Status   C Diff antigen POSITIVE (A) NEGATIVE Final   C Diff toxin NEGATIVE NEGATIVE Final   C Diff interpretation   Final    C. difficile present, but toxin not detected. This indicates  colonization. In most cases, this does not require treatment. If patient has signs and symptoms consistent with colitis, consider treatment. Requires ENTERIC precautions.    Studies/Results CLINICAL DATA: Shaking chills and slurring words today. Right nephrostomy tube in place.  EXAM:  CT ABDOMEN AND PELVIS WITHOUT CONTRAST  TECHNIQUE: Multidetector CT imaging of the abdomen and pelvis was performed following the standard protocol without IV contrast.  COMPARISON: CT abdomen and pelvis 02/20/2015 and 04/06/2015.  FINDINGS: Hiatal hernia is identified. No pleural or pericardial effusion. There is some atelectasis or scar in the left lung base.  Right nephrostomy tube and right ureteral stent are in place. The tubes are well-positioned. There is no right hydronephrosis. A tiny amount of air is seen in the right intrarenal collecting system likely related to the nephrostomy tube. Atrophy of the left kidney is again seen and there is mild fullness of the left intrarenal collecting system. There is some stranding about both kidneys. The walls left ureter appear somewhat thickened. Urinary bladder is decompressed scratch the urinary bladder is nearly completely decompressed.  The gallbladder is been removed. The liver, spleen, adrenal glands and pancreas are unremarkable.  The patient is status post hysterectomy. Fat containing ventral hernias are again seen and unchanged. The largest hernias in the pelvis.  Diverticulosis appears worst in the sigmoid but there is no evidence of diverticulitis. The small bowel and appendix are unremarkable. No fluid collection or lymphadenopathy is seen.  Lumbar spondylosis appears worst at L2-3. No lytic or sclerotic bony lesion is identified.  IMPRESSION: A nephrostomy tube in the right kidney is new since the comparison examinations. The right intrarenal collecting system is decompressed. Tiny amount of gas in an upper pole  calyx is likely related to the nephrostomy tube. Right double-J ureteral stent remains in place in good position.  Mild stranding about both kidneys. Very mild left hydronephrosis and of the walls of the left ureter is unchanged and may be due to infection or inflammation. Findings raise the possibility of pyelonephritis and/or ureteritis.  Diverticulosis without diverticulitis.  Hiatal hernia and fat containing ventral hernias, largest in the pelvis, are unchanged.   Electronically Signed  By: Inge Rise M.D.  On: 05/02/2015 20:12           Vitals     Height Weight BMI (Calculated)    5\' 3"  (1.6 m) 77.565 kg (171 lb) 30.4      Interpretation Summary     CLINICAL DATA: Shaking chills and slurring words today. Right nephrostomy tube in place.  EXAM: CT ABDOMEN AND PELVIS WITHOUT CONTRAST  TECHNIQUE: Multidetector CT imaging of the abdomen and pelvis was performed following the standard protocol without IV contrast.  COMPARISON: CT abdomen and pelvis 02/20/2015 and 04/06/2015.  FINDINGS: Hiatal hernia is identified. No pleural or pericardial effusion. There is some atelectasis or scar in the left lung base.  Right nephrostomy tube and right ureteral stent are in place. The tubes are well-positioned. There is no right hydronephrosis. A tiny amount of air is seen in the right intrarenal collecting system likely related to the nephrostomy tube. Atrophy of the left kidney is again seen and there is mild fullness of the left intrarenal collecting system. There is some stranding about both kidneys. The walls left ureter appear somewhat thickened. Urinary bladder is decompressed scratch the urinary bladder is nearly completely decompressed.  The gallbladder is been removed. The liver, spleen, adrenal glands and pancreas are unremarkable.  The patient is status post hysterectomy. Fat containing ventral hernias are again seen and  unchanged. The largest hernias in the pelvis.  Diverticulosis appears worst in the sigmoid but there is no evidence of diverticulitis. The small bowel and appendix are unremarkable. No fluid collection or lymphadenopathy is seen.  Lumbar spondylosis appears worst at L2-3. No lytic or sclerotic bony lesion is identified.  IMPRESSION: A nephrostomy tube in the right kidney is new since the comparison examinations. The right intrarenal collecting system is decompressed. Tiny amount of gas in an upper pole calyx is likely related to the nephrostomy tube. Right double-J ureteral stent remains in place in good position.  Mild stranding about both kidneys. Very mild left hydronephrosis and of the walls of the left ureter is unchanged and may be due to infection or inflammation. Findings raise the possibility of pyelonephritis and/or ureteritis.  Diverticulosis without diverticulitis.  Hiatal hernia and fat containing ventral hernias, largest in the pelvis, are unchanged.   Electronically Signed  By: Inge Rise M.D.  On: 05/02/2015 20:12    Assessment: Solitary Right kidney, with improved function with right perc tube placement. She has +C. Dif titre now. She will have evaluation for hemodialysis. Creatinine borderline. K was 5.4, now 3.4.  Plan:  Leave right JJ and perc tube in place for now.  Euell Schiff I Brayen Bunn 05/25/2015, 7:58 AM

## 2015-05-25 NOTE — Progress Notes (Signed)
Referring Physician(s): Urology Dr. Diona Fanti  Chief Complaint: Right hydronephrosis s/p right PCN by IR 05/23/15  Subjective: Patient states she still feels weak. She states she does feel slightly better now after the PCN was placed. She denies pain at PCN site    Allergies: Vicodin and Percocet  Medications: Prior to Admission medications   Medication Sig Start Date End Date Taking? Authorizing Provider  acetaminophen (TYLENOL) 500 MG tablet Take 500 mg by mouth every 6 (six) hours as needed for moderate pain.   Yes Historical Provider, MD  acidophilus (RISAQUAD) CAPS capsule Take 2 capsules by mouth daily. 05/07/15  Yes Maryann Mikhail, DO  bimatoprost (LUMIGAN) 0.01 % SOLN Place 1 drop into both eyes at bedtime.   Yes Historical Provider, MD  ENSURE PLUS (ENSURE PLUS) LIQD Take 237 mLs by mouth daily as needed (nutritional supplement).    Yes Historical Provider, MD  Nutritional Supplements (FEEDING SUPPLEMENT, NEPRO CARB STEADY,) LIQD Take 237 mLs by mouth daily. 05/07/15  Yes Maryann Mikhail, DO  ranitidine (ZANTAC) 150 MG tablet Take 150 mg by mouth 2 (two) times daily as needed for heartburn.   Yes Historical Provider, MD  timolol (BETIMOL) 0.5 % ophthalmic solution Place 1 drop into both eyes every morning.    Yes Historical Provider, MD  amoxicillin-clavulanate (AUGMENTIN) 250-125 MG tablet Take 1 tablet by mouth 2 (two) times daily. Patient not taking: Reported on 05/20/2015 05/07/15   Velta Addison Mikhail, DO  HYDROcodone-acetaminophen (NORCO/VICODIN) 5-325 MG tablet Take 1 tablet by mouth 4 (four) times daily as needed for moderate pain.  04/27/15   Historical Provider, MD  saccharomyces boulardii (FLORASTOR) 250 MG capsule Take 1 capsule (250 mg total) by mouth 2 (two) times daily. Patient not taking: Reported on 05/22/2015 05/07/15   Velta Addison Mikhail, DO  sodium chloride (OCEAN) 0.65 % SOLN nasal spray Place 1 spray into both nostrils 3 (three) times daily as needed for  congestion.    Historical Provider, MD   Vital Signs: BP 107/55 mmHg  Pulse 89  Temp(Src) 98.5 F (36.9 C) (Oral)  Resp 20  Ht 5\' 3"  (1.6 m)  Wt 171 lb (77.565 kg)  BMI 30.30 kg/m2  SpO2 97%  LMP 01/19/1992  Physical Exam General: A&Ox3, NAD Abd: Soft, NT, Right PCN intact with 200 cc clear yellow urine output in bag, 775 cc /24 hrs  Imaging: Dg Chest 2 View  05/22/2015  CLINICAL DATA:  Sepsis. Shortness of breath. Chronic kidney disease. EXAM: CHEST  2 VIEW COMPARISON:  05/02/1967 chest radiograph. FINDINGS: Right internal jugular central venous catheter terminates in the lower third of the superior vena cava. Stable cardiomediastinal silhouette with normal heart size and retrocardiac lucency in keeping with known hiatal hernia. No pneumothorax. No pleural effusion. Slightly low lung volumes. No overt pulmonary edema. No focal lung opacity. Partially visualized is a left nephro ureteral stent. IMPRESSION: No active cardiopulmonary disease. Electronically Signed   By: Ilona Sorrel M.D.   On: 05/22/2015 19:52   US Renal  05/23/2015  CLINICAL DATA:  Pyelonephritis. Status post prior right percutaneous nephrostomy tube placement on 04/11/2015 for hydronephrosis. This nephrostomy tube was removed on 05/06/2015 and the patient has an indwelling right ureteral stent. EXAM: RENAL / URINARY TRACT ULTRASOUND COMPLETE COMPARISON:  CT of the abdomen on 05/02/2015 and prior renal ultrasound on 04/11/2015. FINDINGS: Right Kidney: Length: 11.6 cm. Moderate right hydronephrosis present. Portion of the proximal and of a ureteral stent is visible in the renal pelvis. Re- development of hydronephrosis  may implicate relative stent malfunction or occlusion. No focal renal lesions identified on the right. No focal perinephric fluid collections seen by ultrasound. Left Kidney: Length: 9.4 cm. The left kidney demonstrates mild fullness of the collecting system without significant hydronephrosis. No focal renal  lesion. Bladder: The bladder contains dependent debris. Distal portion of the right ureteral stent is visible in the bladder lumen. IMPRESSION: Moderate right hydronephrosis with indwelling right ureteral stent visualized. Re-development of hydronephrosis may implicate relative stent malfunction/occlusion. Electronically Signed   By: Aletta Edouard M.D.   On: 05/23/2015 10:00   Dg Chest Port 1 View  05/23/2015  CLINICAL DATA:  69-year-old female with fever. Patient denies chest complaint. EXAM: PORTABLE CHEST 1 VIEW COMPARISON:  Chest radiograph dated 05/22/2015 FINDINGS: A dialysis catheter is noted in stable positioning. There is an area of increased opacity in the left lung base may represent atelectatic changes. Pneumonia is not excluded. Clinical correlation recommended. Frontal and lateral views of the chest may provide better evaluation. There is no pleural effusion or pneumothorax. Stable cardiac silhouette. The osseous structures are grossly unremarkable. IMPRESSION: Left lung base subsegmental atelectasis. Developing pneumonia is not excluded. Clinical correlation is recommended. Electronically Signed   By: Anner Crete M.D.   On: 05/23/2015 23:24   Ir Nephrostomy Placement Right  05/23/2015  CLINICAL DATA:  Right-sided hydronephrosis with clinical evidence of pyelonephritis and urosepsis. Indwelling ureteral stent is present which is presumably obstructed. The patient requires emergent right-sided percutaneous nephrostomy to decompress the right kidney. History of prior right nephrostomy tube placement for similar reasons on 04/11/2015. This nephrostomy tube was removed on 05/06/2015. EXAM: 1. ULTRASOUND GUIDANCE FOR PUNCTURE OF THE RIGHT RENAL COLLECTING SYSTEM. 2. RIGHT PERCUTANEOUS NEPHROSTOMY TUBE PLACEMENT. COMPARISON:  Renal ultrasound today prior nephrostomy tube placement imaging on 04/11/2015. ANESTHESIA/SEDATION: 2.0 mg IV Versed; 100 mcg IV Fentanyl. Total Moderate Sedation Time 12  minutes. CONTRAST:  5.0 ml Omnipaque 300 MEDICATIONS: 3.375 g IV Zosyn. Antibiotic was administered in an appropriate time frame prior to skin puncture. FLUOROSCOPY TIME:  36 seconds. PROCEDURE: The procedure, risks, benefits, and alternatives were explained to the patient. Questions regarding the procedure were encouraged and answered. The patient understands and consents to the procedure. A time-out was performed prior to initiating the procedure. The right flank region was prepped with Betadine in a sterile fashion, and a sterile drape was applied covering the operative field. A sterile gown and sterile gloves were used for the procedure. Local anesthesia was provided with 1% Lidocaine. Ultrasound was used to localize the right kidney. Under direct ultrasound guidance, a 21 gauge needle was advanced into the renal collecting system. Ultrasound image documentation was performed. Aspiration of urine sample was performed followed by contrast injection. Aspirated urine sample was sent for culture analysis. A transitional dilator was advanced over a guidewire. Percutaneous tract dilatation was then performed over the guidewire. A 10 -French percutaneous nephrostomy tube was then advanced and formed in the collecting system. Catheter position was confirmed by fluoroscopy after contrast injection. The catheter was secured at the skin with a Prolene retention suture and Stat-Lock device. A gravity bag was placed. COMPLICATIONS: None. FINDINGS: Ultrasound demonstrates significant hydronephrosis. There was immediate return of grossly purulent fluid from the renal collecting system consistent with pyonephrosis. A 10 French nephrostomy tube was formed at the level of the renal pelvis. This will be left to gravity drainage. IMPRESSION: Placement of 10 French right percutaneous nephrostomy tube. Fluid return from the collecting system was grossly purulent consistent with  pyonephrosis. A aspirated urine sample was sent for  culture analysis. Electronically Signed   By: Aletta Edouard M.D.   On: 05/23/2015 14:48    Labs:  CBC:  Recent Labs  05/22/15 1857 05/23/15 0602 05/24/15 0522 05/25/15 0515  WBC 22.2* 27.9* 28.6* 8.9  HGB 12.9 10.9* 10.1* 9.4*  HCT 41.4 34.8* 33.1* 29.1*  PLT 367 330 303 199    COAGS:  Recent Labs  11/23/14 0305 02/26/15 0015 05/04/15 0435 05/23/15 1450  INR 1.31 1.17 1.24 1.30  APTT  --  33  --   --     BMP:  Recent Labs  05/22/15 1857 05/23/15 0602 05/24/15 0522 05/25/15 0515  NA 134* 132* 131* 133*  K 5.2* 5.4* 5.4* 3.4*  CL 105 106 108 99*  CO2 16* 15* 10* 23  GLUCOSE 144* 143* 107* 126*  BUN 97* 99* 94* 84*  CALCIUM 9.6 8.7* 8.1* 7.5*  CREATININE 6.58* 7.11* 6.73* 6.05*  GFRNONAA 6* 5* 6* 6*  GFRAA 7* 6* 6* 7*    LIVER FUNCTION TESTS:  Recent Labs  05/22/15 1857 05/23/15 0602 05/24/15 0522 05/25/15 0515  BILITOT 0.7 0.5 0.4 0.7  AST 21 22 23 27   ALT 19 21 21 24   ALKPHOS 107 89 75 61  PROT 7.5 6.2* 5.5* 4.7*  ALBUMIN 3.4* 2.7* 2.4* 1.8*    Assessment and Plan: Solitary right kidney with chronic obstruction s/p ureteral stenting- persistent hydro s/p right PCN 04/11/15 S/p right PCN removal 05/06/15 Pyelonephritis and urosepsis 05/23/15 S/p new right PCN placed 05/23/15, good output clearing up, Cr improving 6.05 (6.73), wbc improved 8.9 (28.6) Will follow, plans per primary team, Urology and Nephrology   Signed: Hedy Jacob 05/25/2015, 2:16 PM   I spent a total of 15 Minutes at the the patient's bedside AND on the patient's hospital floor or unit, greater than 50% of which was counseling/coordinating care for right hydronephrosis

## 2015-05-25 NOTE — Progress Notes (Signed)
[facility] ROUNDING NOTE   Subjective:   Interval History:Right  Ostomy is  draining well and patient has no issues   Objective:  Vital signs in last 24 hours:  Temp:  [97.8 F (36.6 C)-103 F (39.4 C)] 98.5 F (36.9 C) (12/05 1337) Pulse Rate:  [89-111] 89 (12/05 1337) Resp:  [19-21] 20 (12/05 1337) BP: (92-114)/(55-61) 107/55 mmHg (12/05 1337) SpO2:  [97 %-99 %] 97 % (12/05 1337)  Weight change:  Filed Weights   05/22/15 2012  Weight: 77.565 kg (171 lb)    Intake/Output: I/O last 3 completed shifts: In: [id number] [P.O.:300; I.V.:3530] Out: 2650 [Urine:2650]   Intake/Output this shift:     CVS- RRR RS- CTA ABD- BS present soft non-distended   Right ostomy  EXT- no edema   Basic Metabolic Panel:  Recent Labs Lab 05/22/15 1857 05/23/15 0602 05/24/15 0522 05/25/15 0515 05/25/15 1450  NA 134* 132* 131* 133*  --   K 5.2* 5.4* 5.4* 3.4*  --   CL 105 106 108 99*  --   CO2 16* 15* 10* 23  --   GLUCOSE 144* 143* 107* 126*  --   BUN 97* 99* 94* 84*  --   CREATININE 6.58* 7.11* 6.73* 6.05*  --   CALCIUM 9.6 8.7* 8.1* 7.5*  --   MG  --  1.9  --   --  1.6*    Liver Function Tests:  Recent Labs Lab 05/22/15 1857 05/23/15 0602 05/24/15 0522 05/25/15 0515  AST 21 22 23 27   ALT 19 21 21 24   ALKPHOS 107 89 75 61  BILITOT 0.7 0.5 0.4 0.7  PROT 7.5 6.2* 5.5* 4.7*  ALBUMIN 3.4* 2.7* 2.4* 1.8*   No results for input(s): LIPASE, AMYLASE in the last 168 hours. No results for input(s): AMMONIA in the last 168 hours.  CBC:  Recent Labs Lab 05/22/15 0947 05/22/15 1857 05/23/15 0602 05/24/15 0522 05/25/15 0515  WBC  --  22.2* 27.9* 28.6* 8.9  NEUTROABS  --  21.1* 26.8* 28.0* 8.3*  HGB 11.1* 12.9 10.9* 10.1* 9.4*  HCT  --  41.4 34.8* 33.1* 29.1*  MCV  --  90.2 89.5 91.2 86.6  PLT  --  367 330 303 199    Cardiac Enzymes: No results for input(s): CKTOTAL, CKMB, CKMBINDEX, TROPONINI in the last 168 hours.  BNP: Invalid input(s):  POCBNP  CBG: No results for input(s): GLUCAP in the last 168 hours.  Microbiology: Results for orders placed or performed during the hospital encounter of 05/22/15  Culture, blood (routine x 2)     Status: None (Preliminary result)   Collection Time: 05/22/15  6:58 PM  Result Value Ref Range Status   Specimen Description BLOOD RIGHT ANTECUBITAL  Final   Special Requests IN PEDIATRIC BOTTLE 4 ML  Final   Culture   Final    NO GROWTH 3 DAYS Performed at [facility]    Report Status PENDING  Incomplete  Culture, blood (routine x 2)     Status: None (Preliminary result)   Collection Time: 05/22/15  7:02 PM  Result Value Ref Range Status   Specimen Description BLOOD RIGHT HAND  Final   Special Requests IN PEDIATRIC BOTTLE 3 CC  Final   Culture   Final    NO GROWTH 3 DAYS Performed at [facility]    Report Status PENDING  Incomplete  Urine culture     Status: None   Collection Time: 05/22/15  7:56 PM  Result Value Ref Range Status   Specimen Description URINE, CLEAN CATCH  Final   Special Requests NONE  Final   Culture   Final    >=100,000 COLONIES/mL STAPHYLOCOCCUS AUREUS Performed at [facility]    Report Status 05/25/2015 FINAL  Final   Organism ID, Bacteria STAPHYLOCOCCUS AUREUS  Final      Susceptibility   Staphylococcus aureus - MIC*    CIPROFLOXACIN >=8 RESISTANT Resistant     GENTAMICIN <=0.5 SENSITIVE Sensitive     NITROFURANTOIN <=16 SENSITIVE Sensitive     OXACILLIN >=4 RESISTANT Resistant     TETRACYCLINE <=1 SENSITIVE Sensitive     VANCOMYCIN 1 SENSITIVE Sensitive     TRIMETH/SULFA <=10 SENSITIVE Sensitive     CLINDAMYCIN >=8 RESISTANT Resistant     RIFAMPIN <=0.5 SENSITIVE Sensitive     Inducible Clindamycin NEGATIVE Sensitive     * >=100,000 COLONIES/mL STAPHYLOCOCCUS AUREUS  Urine culture     Status: None   Collection Time: 05/23/15  2:01 PM  Result Value Ref Range Status   Specimen Description KIDNEY RIGHT  Final   Special  Requests NONE  Final   Culture   Final    >=100,000 COLONIES/mL YEAST Performed at [facility]    Report Status 05/25/2015 FINAL  Final  Culture, blood (routine x 2)     Status: None (Preliminary result)   Collection Time: 05/23/15  9:26 PM  Result Value Ref Range Status   Specimen Description BLOOD RIGHT WRIST  Final   Special Requests   Final    BOTTLES DRAWN AEROBIC AND ANAEROBIC 10 CC BOTH BOTTLES   Culture   Final    NO GROWTH 2 DAYS Performed at [facility]    Report Status PENDING  Incomplete  Culture, blood (routine x 2)     Status: None (Preliminary result)   Collection Time: 05/23/15  9:30 PM  Result Value Ref Range Status   Specimen Description BLOOD RIGHT HAND  Final   Special Requests BOTTLES DRAWN AEROBIC ONLY 10 CC  Final   Culture   Final    NO GROWTH 2 DAYS Performed at [facility]    Report Status PENDING  Incomplete  C difficile quick scan Reilly PCR reflex     Status: Abnormal   Collection Time: 05/24/15  7:27 PM  Result Value Ref Range Status   C Diff antigen POSITIVE (A) NEGATIVE Final   C Diff toxin NEGATIVE NEGATIVE Final   C Diff interpretation   Final    C. difficile present, but toxin not detected. This indicates colonization. In most cases, this does not require treatment. If patient has signs and symptoms consistent with colitis, consider treatment. Requires ENTERIC precautions.    Coagulation Studies:  Recent Labs  05/23/15 1450  LABPROT 16.3*  INR 1.30    Urinalysis: No results for input(s): COLORURINE, LABSPEC, PHURINE, GLUCOSEU, HGBUR, BILIRUBINUR, KETONESUR, PROTEINUR, UROBILINOGEN, NITRITE, LEUKOCYTESUR in the last 72 hours.  Invalid input(s): APPERANCEUR    Imaging: Dg Chest Port 1 View  05/23/2015  CLINICAL DATA:  69-year-old female with fever. Patient denies chest complaint. EXAM: PORTABLE CHEST 1 VIEW COMPARISON:  Chest radiograph dated 05/22/2015 FINDINGS: A dialysis catheter is noted in stable positioning.  There is an area of increased opacity in the left lung base may represent atelectatic changes. Pneumonia is not excluded. Clinical correlation recommended. Frontal and lateral views of the chest may provide better evaluation. There is no pleural effusion or pneumothorax. Stable cardiac silhouette.  The osseous structures are grossly unremarkable. IMPRESSION: Left lung base subsegmental atelectasis. Developing pneumonia is not excluded. Clinical correlation is recommended. Electronically Signed   By: Anner Crete M.D.   On: 05/23/2015 23:24     Medications:   . sodium chloride 125 mL/hr at 05/25/15 1336   . famotidine  20 mg Oral Daily  . feeding supplement (NEPRO CARB STEADY)  237 mL Oral BID BM  . latanoprost  1 drop Both Eyes QHS  . piperacillin-tazobactam (ZOSYN)  IV  2.25 g Intravenous Q8H  . saccharomyces boulardii  250 mg Oral BID  . sodium bicarbonate  650 mg Oral BID  . sodium chloride  3 mL Intravenous Q12H  . timolol  1 drop Both Eyes q morning - 10a   acetaminophen, dextromethorphan, diphenoxylate-atropine, ondansetron **OR** ondansetron (ZOFRAN) IV  Assessment/ Plan:  Patient was in hospital Oct 2016 with R hydro/ UTI/ pyelo and sepsis. Got some dialysis, had perc neph tube on 10/22 then internal R JJ stent on more recent visit 05/06/15 with removal of the perc neph tube. She had been doing well until the last day or two when she developed the above symptoms. Creat was 7.1 on admission. Pt was admitted and getting IVF's. Renal US showed new worsening of R hydro consistent with possible stent malfunction  Patient appears to be diuresing well with acute on chronic renal failure  Primary nephrologist is Dr Florene Glen  She goes to short stay Friday for a dressing change to catheter  She also receives Aranesp at this time  She is close to needing HD and baseline creatinine about 4-5 mg dl   She is not uremic  She has appointment for AVF next week  Electrolytes are all  improving  --- she continues on IV zosyn and this can be changed to PO when appropriate  --- cultures are positive in urine for Staph aureus and yeast she continues on Vancomycin and Zosyn. C Diff toxin is negative  I shall reduce the rate of IV fluids . I shall sign off but will be available for assistance.  [place]: 3 [name] @TODAY @8 :01 PM

## 2015-05-25 NOTE — Progress Notes (Signed)
[facility] PROGRESS NOTE  [name] [id number] DOB: 1946/02/11 DOA: 05/22/2015 PCP: [name], MD Brief narrative 69 year old female with recurrent peripheral lithiasis with intermittent bilateral hydronephrosis requiring ureteral stent and right-sided nephrostomy, CKD stage V (has HD catheter,  scheduled for AV fistula placement next week) was recently hospitalized for sepsis secondary to pyelonephritis and underwent removal of right percutaneous nephrostomy and lateral double J stent placement with stent exchange and discharged home on antibiotics. Patient had nephrostomy tube placement on 04/11/2015 which was internalized right indwelling ureteral stent and nephrostomy tube was removed on 05/06/2015 during her recent hospitalization. Patient returned to the ED 12/2 with fevers with chills and temperature 103F, poor by mouth intake with dysuria, increased urinary frequency and mild right flank pain. She also found to have acute on chronic kidney disease with creatinine elevated up to 7.1. Patient admitted to hospitalist service for sepsis. Renal ultrasound done showing moderate right hydronephrosis with ureteral stent within the renal pelvis and bladder concerning for ureteral stent failure. Patient seen by urology and consulted IR for urgent right nephrostomy tube which was placed on 12/3.   Assessment/Plan: Severe sepsis Secondary to UTI/pyelonephritis with persistent hydronephrosis. - aggressive IV hydration. Placed foley to decompress the bladder. Blood cultures so far negative. Urine culture growing staph aureus. Sepsis improving with decreased frequency of temperature spikes.Marland Kitchen  leukocytosis normal this morning. Continue empiric coverage with vancomycin and Zosyn. -May need long-term suppressive antibiotics given recurrent urosepsis.   Persistent hydronephrosis with recurrent urosepsis Renal ultrasound showing persistent hydronephrosis despite ureteral stent with  good positioning.  emergent right nephrostomy tube placement by IR given ureteral stent failure. Recommend removal of the ureteral stent once sepsis resolves. Has good urine output. Appreciate urology recommendations.  Acute on chronic kidney disease stage V with metabolic acidosis Possibly associated with dehydration and sepsis with associated hydronephrosis. Follows with Dr. Florene Glen. Has HD  catheter placed during prior hospitalization. ( received only 1 session of dialysis during that time was present. For AV fistula placement this week.  No uremic signs or symptoms.  -Creatinine improving with IV hydration. Nephrology following. Low bicarbonate improved after 3 amps given yesterday.   Diarrhea Possibly due to antibiotics. Continue florostar. Stool for C. difficile positive for C. difficile antigen toxin. Will monitor for now  Anemia of chronic disease Hemoglobin stable  Chronic diastolic CHF Patient is hypovolemic.  Hyperkalemia Improved. Now hypokalemic  GERD Continue PPI  Protein calorie malnutrition Continue supplement  DVT prophylaxis: SCD Diet: Nothing by mouth  Code Status: Full code Family Communication: Daughter at bedside Disposition Plan: Continue telemetry monitoring. Will order PT.   Consultants:  Urology  IR  Nephrology  Procedures:  Renal ultrasound   right nephrostomy by IR on 12/3  Antibiotics:  IV Rocephin 1  IV Zosyn and vancomycin 12/3-  HPI/Subjective: Seen and examined. Had temperature spike of 103F last evening. Reports feeling tired but feels better than previous days. Denies any pain. Still having some diarrhea. Complains of dry cough.  Objective: Filed Vitals:   05/25/15 0200 05/25/15 0607  BP:  92/61  Pulse:  94  Temp: 100.1 F (37.8 C) 97.8 F (36.6 C)  Resp:  19    Intake/Output Summary (Last 24 hours) at 05/25/15 1139 Last data filed at 05/25/15 1127  Gross per 24 hour  Intake   3710 ml  Output   1900 ml  Net    1810 ml   Filed Weights   05/22/15 2012  Weight: 77.565 kg (171 lb)  Exam:   General:  Elderly female lying in bed, fatigued  HEENT: Pallor present, dry oral mucosa, supple neck  Chest: Clear bilaterally  CVS: Normal S1 and S2, no murmurs rub or gallop  GI: Soft, nondistended, nontender, bowel sounds present, no flank tenderness, right nephrostomy site clean and draining clear urine. Foley placed on 12/3  Musculoskeletal: Warm, no edema  CNS: Alert and oriented  Data Reviewed: Basic Metabolic Panel:  Recent Labs Lab 05/22/15 1857 05/23/15 0602 05/24/15 0522 05/25/15 0515  NA 134* 132* 131* 133*  K 5.2* 5.4* 5.4* 3.4*  CL 105 106 108 99*  CO2 16* 15* 10* 23  GLUCOSE 144* 143* 107* 126*  BUN 97* 99* 94* 84*  CREATININE 6.58* 7.11* 6.73* 6.05*  CALCIUM 9.6 8.7* 8.1* 7.5*  MG  --  1.9  --   --    Liver Function Tests:  Recent Labs Lab 05/22/15 1857 05/23/15 0602 05/24/15 0522 05/25/15 0515  AST 21 22 23 27   ALT 19 21 21 24   ALKPHOS 107 89 75 61  BILITOT 0.7 0.5 0.4 0.7  PROT 7.5 6.2* 5.5* 4.7*  ALBUMIN 3.4* 2.7* 2.4* 1.8*   No results for input(s): LIPASE, AMYLASE in the last 168 hours. No results for input(s): AMMONIA in the last 168 hours. CBC:  Recent Labs Lab 05/22/15 0947 05/22/15 1857 05/23/15 0602 05/24/15 0522 05/25/15 0515  WBC  --  22.2* 27.9* 28.6* 8.9  NEUTROABS  --  21.1* 26.8* 28.0* 8.3*  HGB 11.1* 12.9 10.9* 10.1* 9.4*  HCT  --  41.4 34.8* 33.1* 29.1*  MCV  --  90.2 89.5 91.2 86.6  PLT  --  367 330 303 199   Cardiac Enzymes: No results for input(s): CKTOTAL, CKMB, CKMBINDEX, TROPONINI in the last 168 hours. BNP (last 3 results)  Recent Labs  02/26/15 0530 05/23/15 0602  BNP 35.3 37.2    ProBNP (last 3 results) No results for input(s): PROBNP in the last 8760 hours.  CBG: No results for input(s): GLUCAP in the last 168 hours.  Recent Results (from the past 240 hour(s))  Culture, blood (routine x 2)     Status:  None (Preliminary result)   Collection Time: 05/22/15  6:58 PM  Result Value Ref Range Status   Specimen Description BLOOD RIGHT ANTECUBITAL  Final   Special Requests IN PEDIATRIC BOTTLE 4 ML  Final   Culture   Final    NO GROWTH 2 DAYS Performed at [facility]    Report Status PENDING  Incomplete  Culture, blood (routine x 2)     Status: None (Preliminary result)   Collection Time: 05/22/15  7:02 PM  Result Value Ref Range Status   Specimen Description BLOOD RIGHT HAND  Final   Special Requests IN PEDIATRIC BOTTLE 3 CC  Final   Culture   Final    NO GROWTH 2 DAYS Performed at [facility]    Report Status PENDING  Incomplete  Urine culture     Status: None   Collection Time: 05/22/15  7:56 PM  Result Value Ref Range Status   Specimen Description URINE, CLEAN CATCH  Final   Special Requests NONE  Final   Culture   Final    >=100,000 COLONIES/mL STAPHYLOCOCCUS AUREUS Performed at [facility]    Report Status 05/25/2015 FINAL  Final   Organism ID, Bacteria STAPHYLOCOCCUS AUREUS  Final      Susceptibility   Staphylococcus aureus - MIC*    CIPROFLOXACIN >=8  RESISTANT Resistant     GENTAMICIN <=0.5 SENSITIVE Sensitive     NITROFURANTOIN <=16 SENSITIVE Sensitive     OXACILLIN >=4 RESISTANT Resistant     TETRACYCLINE <=1 SENSITIVE Sensitive     VANCOMYCIN 1 SENSITIVE Sensitive     TRIMETH/SULFA <=10 SENSITIVE Sensitive     CLINDAMYCIN >=8 RESISTANT Resistant     RIFAMPIN <=0.5 SENSITIVE Sensitive     Inducible Clindamycin NEGATIVE Sensitive     * >=100,000 COLONIES/mL STAPHYLOCOCCUS AUREUS  Urine culture     Status: None (Preliminary result)   Collection Time: 05/23/15  2:01 PM  Result Value Ref Range Status   Specimen Description KIDNEY RIGHT  Final   Special Requests NONE  Final   Culture   Final    TOO YOUNG TO READ Performed at [facility]    Report Status PENDING  Incomplete  Culture, blood (routine x 2)     Status: None (Preliminary  result)   Collection Time: 05/23/15  9:26 PM  Result Value Ref Range Status   Specimen Description BLOOD RIGHT WRIST  Final   Special Requests   Final    BOTTLES DRAWN AEROBIC AND ANAEROBIC 10 CC BOTH BOTTLES   Culture   Final    NO GROWTH < 24 HOURS Performed at [facility]    Report Status PENDING  Incomplete  Culture, blood (routine x 2)     Status: None (Preliminary result)   Collection Time: 05/23/15  9:30 PM  Result Value Ref Range Status   Specimen Description BLOOD RIGHT HAND  Final   Special Requests BOTTLES DRAWN AEROBIC ONLY 10 CC  Final   Culture   Final    NO GROWTH < 24 HOURS Performed at [facility]    Report Status PENDING  Incomplete  C difficile quick scan w PCR reflex     Status: Abnormal   Collection Time: 05/24/15  7:27 PM  Result Value Ref Range Status   C Diff antigen POSITIVE (A) NEGATIVE Final   C Diff toxin NEGATIVE NEGATIVE Final   C Diff interpretation   Final    C. difficile present, but toxin not detected. This indicates colonization. In most cases, this does not require treatment. If patient has signs and symptoms consistent with colitis, consider treatment. Requires ENTERIC precautions.     Studies: Dg Chest Port 1 View  05/23/2015  CLINICAL DATA:  69-year-old female with fever. Patient denies chest complaint. EXAM: PORTABLE CHEST 1 VIEW COMPARISON:  Chest radiograph dated 05/22/2015 FINDINGS: A dialysis catheter is noted in stable positioning. There is an area of increased opacity in the left lung base may represent atelectatic changes. Pneumonia is not excluded. Clinical correlation recommended. Frontal and lateral views of the chest may provide better evaluation. There is no pleural effusion or pneumothorax. Stable cardiac silhouette. The osseous structures are grossly unremarkable. IMPRESSION: Left lung base subsegmental atelectasis. Developing pneumonia is not excluded. Clinical correlation is recommended. Electronically Signed   By:  Anner Crete M.D.   On: 05/23/2015 23:24   Ir Nephrostomy Placement Right  05/23/2015  CLINICAL DATA:  Right-sided hydronephrosis with clinical evidence of pyelonephritis and urosepsis. Indwelling ureteral stent is present which is presumably obstructed. The patient requires emergent right-sided percutaneous nephrostomy to decompress the right kidney. History of prior right nephrostomy tube placement for similar reasons on 04/11/2015. This nephrostomy tube was removed on 05/06/2015. EXAM: 1. ULTRASOUND GUIDANCE FOR PUNCTURE OF THE RIGHT RENAL COLLECTING SYSTEM. 2. RIGHT PERCUTANEOUS NEPHROSTOMY TUBE PLACEMENT.  COMPARISON:  Renal ultrasound today prior nephrostomy tube placement imaging on 04/11/2015. ANESTHESIA/SEDATION: 2.0 mg IV Versed; 100 mcg IV Fentanyl. Total Moderate Sedation Time 12 minutes. CONTRAST:  5.0 ml Omnipaque 300 MEDICATIONS: 3.375 g IV Zosyn. Antibiotic was administered in an appropriate time frame prior to skin puncture. FLUOROSCOPY TIME:  36 seconds. PROCEDURE: The procedure, risks, benefits, and alternatives were explained to the patient. Questions regarding the procedure were encouraged and answered. The patient understands and consents to the procedure. A time-out was performed prior to initiating the procedure. The right flank region was prepped with Betadine in a sterile fashion, and a sterile drape was applied covering the operative field. A sterile gown and sterile gloves were used for the procedure. Local anesthesia was provided with 1% Lidocaine. Ultrasound was used to localize the right kidney. Under direct ultrasound guidance, a 21 gauge needle was advanced into the renal collecting system. Ultrasound image documentation was performed. Aspiration of urine sample was performed followed by contrast injection. Aspirated urine sample was sent for culture analysis. A transitional dilator was advanced over a guidewire. Percutaneous tract dilatation was then performed over the  guidewire. A 10 -French percutaneous nephrostomy tube was then advanced and formed in the collecting system. Catheter position was confirmed by fluoroscopy after contrast injection. The catheter was secured at the skin with a Prolene retention suture and Stat-Lock device. A gravity bag was placed. COMPLICATIONS: None. FINDINGS: Ultrasound demonstrates significant hydronephrosis. There was immediate return of grossly purulent fluid from the renal collecting system consistent with pyonephrosis. A 10 French nephrostomy tube was formed at the level of the renal pelvis. This will be left to gravity drainage. IMPRESSION: Placement of 10 French right percutaneous nephrostomy tube. Fluid return from the collecting system was grossly purulent consistent with pyonephrosis. A aspirated urine sample was sent for culture analysis. Electronically Signed   By: Aletta Edouard M.D.   On: 05/23/2015 14:48    Scheduled Meds: . famotidine  20 mg Oral Daily  . feeding supplement (NEPRO CARB STEADY)  237 mL Oral BID BM  . latanoprost  1 drop Both Eyes QHS  . piperacillin-tazobactam (ZOSYN)  IV  2.25 g Intravenous Q8H  . saccharomyces boulardii  250 mg Oral BID  . sodium bicarbonate  650 mg Oral BID  . sodium chloride  3 mL Intravenous Q12H  . timolol  1 drop Both Eyes q morning - 10a   Continuous Infusions: .  sodium bicarbonate 150 mEq in sterile water 1000 mL infusion 150 mL/hr at 05/25/15 1138      Time spent: 35 minutes    Haile Bosler, [place]  [facility] Pager [phone]. If 7PM-7AM, please contact night-coverage at [email or web address], password [facility] 05/25/2015, 11:39 AM  LOS: 3 days

## 2015-05-26 ENCOUNTER — Encounter (HOSPITAL_COMMUNITY): Payer: Self-pay | Admitting: *Deleted

## 2015-05-26 DIAGNOSIS — N1330 Unspecified hydronephrosis: Secondary | ICD-10-CM | POA: Insufficient documentation

## 2015-05-26 DIAGNOSIS — A412 Sepsis due to unspecified staphylococcus: Principal | ICD-10-CM

## 2015-05-26 LAB — BASIC METABOLIC PANEL
Anion gap: 11 (ref 5–15)
BUN: 73 mg/dL — ABNORMAL HIGH (ref 6–20)
CO2: 25 mmol/L (ref 22–32)
Calcium: 8.1 mg/dL — ABNORMAL LOW (ref 8.9–10.3)
Chloride: 99 mmol/L — ABNORMAL LOW (ref 101–111)
Creatinine, Ser: 5.49 mg/dL — ABNORMAL HIGH (ref 0.44–1.00)
GFR calc Af Amer: 8 mL/min — ABNORMAL LOW (ref >60)
GFR calc non Af Amer: 7 mL/min — ABNORMAL LOW (ref >60)
Glucose, Bld: 110 mg/dL — ABNORMAL HIGH (ref 65–99)
Potassium: 3.3 mmol/L — ABNORMAL LOW (ref 3.5–5.1)
Sodium: 135 mmol/L (ref 135–145)

## 2015-05-26 LAB — CBC
HCT: 30.1 % — ABNORMAL LOW (ref 36.0–46.0)
Hemoglobin: 9.6 g/dL — ABNORMAL LOW (ref 12.0–15.0)
MCH: 27.6 pg (ref 26.0–34.0)
MCHC: 31.9 g/dL (ref 30.0–36.0)
MCV: 86.5 fL (ref 78.0–100.0)
Platelets: 189 10*3/uL (ref 150–400)
RBC: 3.48 MIL/uL — ABNORMAL LOW (ref 3.87–5.11)
RDW: 17 % — ABNORMAL HIGH (ref 11.5–15.5)
WBC: 5.9 10*3/uL (ref 4.0–10.5)

## 2015-05-26 MED ORDER — LOPERAMIDE HCL 2 MG PO CAPS
4.0000 mg | ORAL_CAPSULE | ORAL | Status: DC | PRN
  Administered 2015-05-26 – 2015-05-30 (×8): 4 mg via ORAL
  Filled 2015-05-26 (×8): qty 2

## 2015-05-26 MED ORDER — PROMETHAZINE HCL 25 MG/ML IJ SOLN
12.5000 mg | Freq: Once | INTRAMUSCULAR | Status: AC
  Administered 2015-05-26: 12.5 mg via INTRAVENOUS
  Filled 2015-05-26: qty 1

## 2015-05-26 MED ORDER — POTASSIUM CHLORIDE CRYS ER 20 MEQ PO TBCR
40.0000 meq | EXTENDED_RELEASE_TABLET | Freq: Once | ORAL | Status: AC
  Administered 2015-05-26: 40 meq via ORAL
  Filled 2015-05-26: qty 2

## 2015-05-26 NOTE — Clinical Documentation Improvement (Signed)
Hospitalist  Please update your documentation within the medical record to reflect your response to this query. Thank you  Can the diagnosis of Malnutrition be further specified?   Document Severity - Severe(third degree), Moderate (second degree), Mild (first degree)  Other condition  Unable to clinically determine  Document any associated diagnoses/conditions  Supporting Information: :  05/24/14 progr note.Marland KitchenMarland Kitchen"Protein calorie malnutrition: Continue supplement"... 05/24/15 Nutrition eval..Marland Kitchen"Severe malnutrition in context of chronic illness"... See full nutrition eval 05/24/15 for full details & tx  Please exercise your independent, professional judgment when responding. A specific answer is not anticipated or expected.  Thank You, Ermelinda Das, RN, BSN, [place] Certified Clinical Documentation Specialist [place]: Health Information Management [phone]

## 2015-05-26 NOTE — Progress Notes (Signed)
Patient ID: [name], female   DOB: 13-Apr-1946, 69 y.o.   MRN: [id number]         Subjective: Patient feeling a little better today. Some soreness at right flank nephrostomy site. Denies nausea or vomiting.   Allergies: Vicodin and Percocet  Medications: Prior to Admission medications   Medication Sig Start Date End Date Taking? Authorizing Provider  acetaminophen (TYLENOL) 500 MG tablet Take 500 mg by mouth every 6 (six) hours as needed for moderate pain.   Yes Historical Provider, MD  acidophilus (RISAQUAD) CAPS capsule Take 2 capsules by mouth daily. 05/07/15  Yes Maryann Mikhail, DO  bimatoprost (LUMIGAN) 0.01 % SOLN Place 1 drop into both eyes at bedtime.   Yes Historical Provider, MD  ENSURE PLUS (ENSURE PLUS) LIQD Take 237 mLs by mouth daily as needed (nutritional supplement).    Yes Historical Provider, MD  Nutritional Supplements (FEEDING SUPPLEMENT, NEPRO CARB STEADY,) LIQD Take 237 mLs by mouth daily. 05/07/15  Yes Maryann Mikhail, DO  ranitidine (ZANTAC) 150 MG tablet Take 150 mg by mouth 2 (two) times daily as needed for heartburn.   Yes Historical Provider, MD  timolol (BETIMOL) 0.5 % ophthalmic solution Place 1 drop into both eyes every morning.    Yes Historical Provider, MD  amoxicillin-clavulanate (AUGMENTIN) 250-125 MG tablet Take 1 tablet by mouth 2 (two) times daily. Patient not taking: Reported on 05/20/2015 05/07/15   Velta Addison Mikhail, DO  HYDROcodone-acetaminophen (NORCO/VICODIN) 5-325 MG tablet Take 1 tablet by mouth 4 (four) times daily as needed for moderate pain.  04/27/15   Historical Provider, MD  saccharomyces boulardii (FLORASTOR) 250 MG capsule Take 1 capsule (250 mg total) by mouth 2 (two) times daily. Patient not taking: Reported on 05/22/2015 05/07/15   Velta Addison Mikhail, DO  sodium chloride (OCEAN) 0.65 % SOLN nasal spray Place 1 spray into both nostrils 3 (three) times daily as needed for congestion.    Historical Provider, MD     Vital  Signs: BP 113/65 mmHg  Pulse 104  Temp(Src) 98.1 F (36.7 C) (Axillary)  Resp 20  Ht 5\' 3"  (1.6 m)  Wt 171 lb (77.565 kg)  BMI 30.30 kg/m2  SpO2 99%  LMP 01/19/1992  Physical Exam awake/alert. Right nephrostomy intact, insertion site okay, mildly tender to palpation, output 6 50 mL yellow urine, cultures- yeast, staph  Imaging: Dg Chest 2 View  05/22/2015  CLINICAL DATA:  Sepsis. Shortness of breath. Chronic kidney disease. EXAM: CHEST  2 VIEW COMPARISON:  05/02/1967 chest radiograph. FINDINGS: Right internal jugular central venous catheter terminates in the lower third of the superior vena cava. Stable cardiomediastinal silhouette with normal heart size and retrocardiac lucency in keeping with known hiatal hernia. No pneumothorax. No pleural effusion. Slightly low lung volumes. No overt pulmonary edema. No focal lung opacity. Partially visualized is a left nephro ureteral stent. IMPRESSION: No active cardiopulmonary disease. Electronically Signed   By: Ilona Sorrel M.D.   On: 05/22/2015 19:52   US Renal  05/23/2015  CLINICAL DATA:  Pyelonephritis. Status post prior right percutaneous nephrostomy tube placement on 04/11/2015 for hydronephrosis. This nephrostomy tube was removed on 05/06/2015 and the patient has an indwelling right ureteral stent. EXAM: RENAL / URINARY TRACT ULTRASOUND COMPLETE COMPARISON:  CT of the abdomen on 05/02/2015 and prior renal ultrasound on 04/11/2015. FINDINGS: Right Kidney: Length: 11.6 cm. Moderate right hydronephrosis present. Portion of the proximal and of a ureteral stent is visible in the renal pelvis. Re- development of hydronephrosis may implicate relative stent  malfunction or occlusion. No focal renal lesions identified on the right. No focal perinephric fluid collections seen by ultrasound. Left Kidney: Length: 9.4 cm. The left kidney demonstrates mild fullness of the collecting system without significant hydronephrosis. No focal renal lesion. Bladder: The  bladder contains dependent debris. Distal portion of the right ureteral stent is visible in the bladder lumen. IMPRESSION: Moderate right hydronephrosis with indwelling right ureteral stent visualized. Re-development of hydronephrosis may implicate relative stent malfunction/occlusion. Electronically Signed   By: Aletta Edouard M.D.   On: 05/23/2015 10:00   Dg Chest Port 1 View  05/23/2015  CLINICAL DATA:  83-year-old female with fever. Patient denies chest complaint. EXAM: PORTABLE CHEST 1 VIEW COMPARISON:  Chest radiograph dated 05/22/2015 FINDINGS: A dialysis catheter is noted in stable positioning. There is an area of increased opacity in the left lung base may represent atelectatic changes. Pneumonia is not excluded. Clinical correlation recommended. Frontal and lateral views of the chest may provide better evaluation. There is no pleural effusion or pneumothorax. Stable cardiac silhouette. The osseous structures are grossly unremarkable. IMPRESSION: Left lung base subsegmental atelectasis. Developing pneumonia is not excluded. Clinical correlation is recommended. Electronically Signed   By: Anner Crete M.D.   On: 05/23/2015 23:24   Ir Nephrostomy Placement Right  05/23/2015  CLINICAL DATA:  Right-sided hydronephrosis with clinical evidence of pyelonephritis and urosepsis. Indwelling ureteral stent is present which is presumably obstructed. The patient requires emergent right-sided percutaneous nephrostomy to decompress the right kidney. History of prior right nephrostomy tube placement for similar reasons on 04/11/2015. This nephrostomy tube was removed on 05/06/2015. EXAM: 1. ULTRASOUND GUIDANCE FOR PUNCTURE OF THE RIGHT RENAL COLLECTING SYSTEM. 2. RIGHT PERCUTANEOUS NEPHROSTOMY TUBE PLACEMENT. COMPARISON:  Renal ultrasound today prior nephrostomy tube placement imaging on 04/11/2015. ANESTHESIA/SEDATION: 2.0 mg IV Versed; 100 mcg IV Fentanyl. Total Moderate Sedation Time 12 minutes. CONTRAST:  5.0  ml Omnipaque 300 MEDICATIONS: 3.375 g IV Zosyn. Antibiotic was administered in an appropriate time frame prior to skin puncture. FLUOROSCOPY TIME:  36 seconds. PROCEDURE: The procedure, risks, benefits, and alternatives were explained to the patient. Questions regarding the procedure were encouraged and answered. The patient understands and consents to the procedure. A time-out was performed prior to initiating the procedure. The right flank region was prepped with Betadine in a sterile fashion, and a sterile drape was applied covering the operative field. A sterile gown and sterile gloves were used for the procedure. Local anesthesia was provided with 1% Lidocaine. Ultrasound was used to localize the right kidney. Under direct ultrasound guidance, a 21 gauge needle was advanced into the renal collecting system. Ultrasound image documentation was performed. Aspiration of urine sample was performed followed by contrast injection. Aspirated urine sample was sent for culture analysis. A transitional dilator was advanced over a guidewire. Percutaneous tract dilatation was then performed over the guidewire. A 10 -French percutaneous nephrostomy tube was then advanced and formed in the collecting system. Catheter position was confirmed by fluoroscopy after contrast injection. The catheter was secured at the skin with a Prolene retention suture and Stat-Lock device. A gravity bag was placed. COMPLICATIONS: None. FINDINGS: Ultrasound demonstrates significant hydronephrosis. There was immediate return of grossly purulent fluid from the renal collecting system consistent with pyonephrosis. A 10 French nephrostomy tube was formed at the level of the renal pelvis. This will be left to gravity drainage. IMPRESSION: Placement of 10 French right percutaneous nephrostomy tube. Fluid return from the collecting system was grossly purulent consistent with pyonephrosis. A aspirated urine  sample was sent for culture analysis.  Electronically Signed   By: Aletta Edouard M.D.   On: 05/23/2015 14:48    Labs:  CBC:  Recent Labs  05/23/15 0602 05/24/15 0522 05/25/15 0515 05/26/15 0545  WBC 27.9* 28.6* 8.9 5.9  HGB 10.9* 10.1* 9.4* 9.6*  HCT 34.8* 33.1* 29.1* 30.1*  PLT 330 303 199 189    COAGS:  Recent Labs  11/23/14 0305 02/26/15 0015 05/04/15 0435 05/23/15 1450  INR 1.31 1.17 1.24 1.30  APTT  --  33  --   --     BMP:  Recent Labs  05/23/15 0602 05/24/15 0522 05/25/15 0515 05/26/15 0545  NA 132* 131* 133* 135  K 5.4* 5.4* 3.4* 3.3*  CL 106 108 99* 99*  CO2 15* 10* 23 25  GLUCOSE 143* 107* 126* 110*  BUN 99* 94* 84* 73*  CALCIUM 8.7* 8.1* 7.5* 8.1*  CREATININE 7.11* 6.73* 6.05* 5.49*  GFRNONAA 5* 6* 6* 7*  GFRAA 6* 6* 7* 8*    LIVER FUNCTION TESTS:  Recent Labs  05/22/15 1857 05/23/15 0602 05/24/15 0522 05/25/15 0515  BILITOT 0.7 0.5 0.4 0.7  AST 21 22 23 27   ALT 19 21 21 24   ALKPHOS 107 89 75 61  PROT 7.5 6.2* 5.5* 4.7*  ALBUMIN 3.4* 2.7* 2.4* 1.8*    Assessment and Plan: Patient with history of pyelonephritis/urosepsis, right-sided hydronephrosis, presumably obstructed indwelling right ureteral stent; status post right percutaneous nephrostomy on 12/3; patient afebrile, WBC 5.9, creatinine 5.49(6.05), K3.3, urine cultures with yeast and staph; continue current treatment as outlined by urology.   Signed: D. Rowe Robert 05/26/2015, 5:23 PM   I spent a total of 15 minutes at the the patient's bedside AND on the patient's hospital floor or unit, greater than 50% of which was counseling/coordinating care for right percutaneous nephrostomy

## 2015-05-26 NOTE — Progress Notes (Signed)
Subjective: Patient reports feeling slightly better. Seen by Nephrology yesterday, and is scheduled for vascular access next week. Perc nephrostomy draining well, and JJ draining also. C/s + ( see below). Baseline Cr 4.5 ( Cr is slowly resolving to baseline). IV Zosyn. C. Diff NEG.  Objective: Vital signs in last 24 hours: Temp:  [98.2 F (36.8 C)-98.9 F (37.2 C)] 98.2 F (36.8 C) (12/06 0507) Pulse Rate:  [78-89] 78 (12/06 0507) Resp:  [20] 20 (12/06 0507) BP: (107-112)/(55-68) 112/68 mmHg (12/06 0507) SpO2:  [97 %] 97 % (12/06 0507)A  Intake/Output from previous day: 12/05 0701 - 12/06 0700 In: 2135.8 [P.O.:120; I.V.:1555.8; IV Piggyback:450] Out: 2350 [Urine:2350] Intake/Output this shift:    Past Medical History  Diagnosis Date  . GERD (gastroesophageal reflux disease)   . Glaucoma   . H/O renal calculi 2002 & 2006  . H/O hiatal hernia   . Headache(784.0)     migraine  . Pneumonia     dx 10-06-2014 per CXR--  on 10-27-2014 pt states finished antibiotic and denies cough or fever  . Hyperlipidemia   . History of MI (myocardial infarction)     10/ 2013 in setting of Septic Shock  . History of atrial fibrillation     10/ 2013  in setting of Septic Shock  . History of CHF (congestive heart failure)     10/ 2013 in setting of septic shock  . Sigmoid diverticulosis   . Nephrolithiasis     bilateral  . CKD (chronic kidney disease), stage III     nephrologist-  dr Florene Glen  . Right ureteral stone   . History of acute respiratory failure     10/ 2013  -- ventilated in setting septic shock  . Dysrhythmia     Afib in 2013 when she had septic shock related to stone ureteral obstruction  . Myocardial infarction [facility])     Was reported in 2013 during hospitalization with septic shock  . Septic shock ([place]) 04/04/2012  . Sepsis ([place])   . Complication of anesthesia     use a little anesthesia , per patient MD states she quit breathing   . Hypertension     medication removed  from regimen due to low blood pressure   . Peripheral vascular disease ([place])   . S/P hemodialysis catheter insertion ([place]) 04/11/2015     right anterior chest , only used once   . History of nephrostomy 04/11/2015     currently inplace 04/28/2015   . History of blood transfusion 04/13/2015     Physical Exam:  Lungs - Normal respiratory effort, chest expands symmetrically.  Abdomen - Soft, non-tender & non-distended.  Lab Results:  Recent Labs  05/24/15 0522 05/25/15 0515 05/26/15 0545  WBC 28.6* 8.9 5.9  HGB 10.1* 9.4* 9.6*  HCT 33.1* 29.1* 30.1*   BMET  Recent Labs  05/25/15 0515 05/26/15 0545  NA 133* 135  K 3.4* 3.3*  CL 99* 99*  CO2 23 25  GLUCOSE 126* 110*  BUN 84* 73*  CREATININE 6.05* 5.49*  CALCIUM 7.5* 8.1*   No results for input(s): LABURIN in the last 72 hours. Results for orders placed or performed during the hospital encounter of 05/22/15  Culture, blood (routine x 2)     Status: None (Preliminary result)   Collection Time: 05/22/15  6:58 PM  Result Value Ref Range Status   Specimen Description BLOOD RIGHT ANTECUBITAL  Final   Special Requests IN PEDIATRIC BOTTLE 4 ML  Final  Culture   Final    NO GROWTH 3 DAYS Performed at [facility]    Report Status PENDING  Incomplete  Culture, blood (routine x 2)     Status: None (Preliminary result)   Collection Time: 05/22/15  7:02 PM  Result Value Ref Range Status   Specimen Description BLOOD RIGHT HAND  Final   Special Requests IN PEDIATRIC BOTTLE 3 CC  Final   Culture   Final    NO GROWTH 3 DAYS Performed at [facility]    Report Status PENDING  Incomplete  Urine culture     Status: None   Collection Time: 05/22/15  7:56 PM  Result Value Ref Range Status   Specimen Description URINE, CLEAN CATCH  Final   Special Requests NONE  Final   Culture   Final    >=100,000 COLONIES/mL STAPHYLOCOCCUS AUREUS Performed at [facility]    Report Status 05/25/2015 FINAL  Final    Organism ID, Bacteria STAPHYLOCOCCUS AUREUS  Final      Susceptibility   Staphylococcus aureus - MIC*    CIPROFLOXACIN >=8 RESISTANT Resistant     GENTAMICIN <=0.5 SENSITIVE Sensitive     NITROFURANTOIN <=16 SENSITIVE Sensitive     OXACILLIN >=4 RESISTANT Resistant     TETRACYCLINE <=1 SENSITIVE Sensitive     VANCOMYCIN 1 SENSITIVE Sensitive     TRIMETH/SULFA <=10 SENSITIVE Sensitive     CLINDAMYCIN >=8 RESISTANT Resistant     RIFAMPIN <=0.5 SENSITIVE Sensitive     Inducible Clindamycin NEGATIVE Sensitive     * >=100,000 COLONIES/mL STAPHYLOCOCCUS AUREUS  Urine culture     Status: None   Collection Time: 05/23/15  2:01 PM  Result Value Ref Range Status   Specimen Description KIDNEY RIGHT  Final   Special Requests NONE  Final   Culture   Final    >=100,000 COLONIES/mL YEAST Performed at [facility]    Report Status 05/25/2015 FINAL  Final  Culture, blood (routine x 2)     Status: None (Preliminary result)   Collection Time: 05/23/15  9:26 PM  Result Value Ref Range Status   Specimen Description BLOOD RIGHT WRIST  Final   Special Requests   Final    BOTTLES DRAWN AEROBIC AND ANAEROBIC 10 CC BOTH BOTTLES   Culture   Final    NO GROWTH 2 DAYS Performed at [facility]    Report Status PENDING  Incomplete  Culture, blood (routine x 2)     Status: None (Preliminary result)   Collection Time: 05/23/15  9:30 PM  Result Value Ref Range Status   Specimen Description BLOOD RIGHT HAND  Final   Special Requests BOTTLES DRAWN AEROBIC ONLY 10 CC  Final   Culture   Final    NO GROWTH 2 DAYS Performed at [facility]    Report Status PENDING  Incomplete  C difficile quick scan w PCR reflex     Status: Abnormal   Collection Time: 05/24/15  7:27 PM  Result Value Ref Range Status   C Diff antigen POSITIVE (A) NEGATIVE Final   C Diff toxin NEGATIVE NEGATIVE Final   C Diff interpretation   Final    C. difficile present, but toxin not detected. This indicates  colonization. In most cases, this does not require treatment. If patient has signs and symptoms consistent with colitis, consider treatment. Requires ENTERIC precautions.    Studies/Results: No results found.  Assessment: Improving. Cr moving back toward baseline. IV  zosyn. For HD access next week.  Plan: Antibiotic and HD cath next week. Uremia improving. Pt continues to improve.  [name] I [name] 05/26/2015, 7:50 AM

## 2015-05-26 NOTE — Progress Notes (Signed)
[facility] PROGRESS NOTE  [name] [id number] DOB: [birth year] DOA: 05/22/2015 PCP: [name], MD Brief narrative 69 year old female with recurrent peripheral lithiasis with intermittent bilateral hydronephrosis requiring ureteral stent and right-sided nephrostomy, CKD stage V (has HD catheter,  scheduled for AV fistula placement next week) was recently hospitalized for sepsis secondary to pyelonephritis and underwent removal of right percutaneous nephrostomy and lateral double J stent placement with stent exchange and discharged home on antibiotics. Patient had nephrostomy tube placement on 04/11/2015 which was internalized right indwelling ureteral stent and nephrostomy tube was removed on 05/06/2015 during her recent hospitalization. Patient returned to the ED 12/2 with fevers with chills and temperature 103F, poor by mouth intake with dysuria, increased urinary frequency and mild right flank pain. She also found to have acute on chronic kidney disease with creatinine elevated up to 7.1. Patient admitted to hospitalist service for sepsis. Renal ultrasound done showing moderate right hydronephrosis with ureteral stent within the renal pelvis and bladder concerning for ureteral stent failure. Patient seen by urology and consulted IR for urgent right nephrostomy tube which was placed on 12/3.   Assessment/Plan: Severe sepsis Secondary to UTI/pyelonephritis with persistent hydronephrosis. - Received aggressive IV hydration. Placed foley to decompress the bladder. Blood cultures so far negative. Urine culture growing staph aureus. sensitive to vanco and tetracycline. Narrow abx to vanco ( d/c zosyn) . Will d/w ID on course of abx upon d/c. Sepsis resolved. Afebrile past 24 hours..-May need long-term suppressive antibiotics given recurrent urosepsis.   Persistent hydronephrosis with recurrent urosepsis Renal ultrasound showing persistent hydronephrosis despite ureteral stent  with good positioning.  emergent right nephrostomy tube placement by IR given ureteral stent failure. Recommend removal of the ureteral stent once sepsis resolves. Has good urine output. Appreciate urology recommendations.  Acute on chronic kidney disease stage V with metabolic acidosis Possibly associated with dehydration and sepsis with associated hydronephrosis. Follows with Dr. Florene Glen. Has HD  catheter placed during prior hospitalization. ( received only 1 session of dialysis during that time was present. For AV fistula placement this week.  No uremic signs or symptoms.  -Creatinine improving with IV hydration. Nephrology following. Metabolic acidosis resolved with IV bicarbonate.    Diarrhea Possibly due to antibiotics. Continue florostar. Stool for C. difficile positive but negative for C. difficile antigen toxin. Added Imodium and Lomotil.   Anemia of chronic disease Hemoglobin stable  Chronic diastolic CHF Patient is hypovolemic.  Hypokalemia/hypomagnesemia Secondary to ongoing diarrhea. Replenished   GERD Continue PPI  Protein calorie malnutrition Continue supplement  DVT prophylaxis: SCD Diet: Regular  Code Status: Full code Family Communication: None at bedside Disposition Plan: Continue telemetry monitoring. PT evaluation. Possibly discharge in next few days if continues to improve.   Consultants:  Urology  IR  Nephrology  Procedures:  Renal ultrasound   right nephrostomy by IR on 12/3  Antibiotics:  IV Rocephin 1  IV Zosyn 12/3-12/6  IV vancomycin 12/3--  HPI/Subjective: CNS exam and. Has ongoing diarrhea. Objective: Filed Vitals:   05/26/15 0507 05/26/15 1423  BP: 112/68 113/65  Pulse: 78 104  Temp: 98.2 F (36.8 C) 98.1 F (36.7 C)  Resp: 20 20    Intake/Output Summary (Last 24 hours) at 05/26/15 1456 Last data filed at 05/26/15 1400  Gross per 24 hour  Intake 2255.83 ml  Output   2400 ml  Net -144.17 ml   Filed Weights    05/22/15 2012  Weight: 77.565 kg (171 lb)    Exam:   General:  Elderly female lying in bed, fatigued  HEENT: Pallor present, moist oral mucosa, supple neck  Chest: Clear bilaterally  CVS: Normal S1 and S2, no murmurs rub or gallop  GI: Soft, nondistended, nontender, bowel sounds present, no flank tenderness, right nephrostomy site clean and draining clear urine. Foley placed on 12/3  Musculoskeletal: Warm, no edema  CNS: Alert and oriented  Data Reviewed: Basic Metabolic Panel:  Recent Labs Lab 05/22/15 1857 05/23/15 0602 05/24/15 0522 05/25/15 0515 05/25/15 1450 05/26/15 0545  NA 134* 132* 131* 133*  --  135  K 5.2* 5.4* 5.4* 3.4*  --  3.3*  CL 105 106 108 99*  --  99*  CO2 16* 15* 10* 23  --  25  GLUCOSE 144* 143* 107* 126*  --  110*  BUN 97* 99* 94* 84*  --  73*  CREATININE 6.58* 7.11* 6.73* 6.05*  --  5.49*  CALCIUM 9.6 8.7* 8.1* 7.5*  --  8.1*  MG  --  1.9  --   --  1.6*  --    Liver Function Tests:  Recent Labs Lab 05/22/15 1857 05/23/15 0602 05/24/15 0522 05/25/15 0515  AST 21 22 23 27   ALT 19 21 21 24   ALKPHOS 107 89 75 61  BILITOT 0.7 0.5 0.4 0.7  PROT 7.5 6.2* 5.5* 4.7*  ALBUMIN 3.4* 2.7* 2.4* 1.8*   No results for input(s): LIPASE, AMYLASE in the last 168 hours. No results for input(s): AMMONIA in the last 168 hours. CBC:  Recent Labs Lab 05/22/15 1857 05/23/15 0602 05/24/15 0522 05/25/15 0515 05/26/15 0545  WBC 22.2* 27.9* 28.6* 8.9 5.9  NEUTROABS 21.1* 26.8* 28.0* 8.3*  --   HGB 12.9 10.9* 10.1* 9.4* 9.6*  HCT 41.4 34.8* 33.1* 29.1* 30.1*  MCV 90.2 89.5 91.2 86.6 86.5  PLT 367 330 303 199 189   Cardiac Enzymes: No results for input(s): CKTOTAL, CKMB, CKMBINDEX, TROPONINI in the last 168 hours. BNP (last 3 results)  Recent Labs  02/26/15 0530 05/23/15 0602  BNP 35.3 37.2    ProBNP (last 3 results) No results for input(s): PROBNP in the last 8760 hours.  CBG: No results for input(s): GLUCAP in the last 168  hours.  Recent Results (from the past 240 hour(s))  Culture, blood (routine x 2)     Status: None (Preliminary result)   Collection Time: 05/22/15  6:58 PM  Result Value Ref Range Status   Specimen Description BLOOD RIGHT ANTECUBITAL  Final   Special Requests IN PEDIATRIC BOTTLE 4 ML  Final   Culture   Final    NO GROWTH 4 DAYS Performed at [facility]    Report Status PENDING  Incomplete  Culture, blood (routine x 2)     Status: None (Preliminary result)   Collection Time: 05/22/15  7:02 PM  Result Value Ref Range Status   Specimen Description BLOOD RIGHT HAND  Final   Special Requests IN PEDIATRIC BOTTLE 3 CC  Final   Culture   Final    NO GROWTH 4 DAYS Performed at [facility]    Report Status PENDING  Incomplete  Urine culture     Status: None   Collection Time: 05/22/15  7:56 PM  Result Value Ref Range Status   Specimen Description URINE, CLEAN CATCH  Final   Special Requests NONE  Final   Culture   Final    >=100,000 COLONIES/mL STAPHYLOCOCCUS AUREUS Performed at [facility]    Report Status 05/25/2015 FINAL  Final   Organism ID, Bacteria STAPHYLOCOCCUS AUREUS  Final      Susceptibility   Staphylococcus aureus - MIC*    CIPROFLOXACIN >=8 RESISTANT Resistant     GENTAMICIN <=0.5 SENSITIVE Sensitive     NITROFURANTOIN <=16 SENSITIVE Sensitive     OXACILLIN >=4 RESISTANT Resistant     TETRACYCLINE <=1 SENSITIVE Sensitive     VANCOMYCIN 1 SENSITIVE Sensitive     TRIMETH/SULFA <=10 SENSITIVE Sensitive     CLINDAMYCIN >=8 RESISTANT Resistant     RIFAMPIN <=0.5 SENSITIVE Sensitive     Inducible Clindamycin NEGATIVE Sensitive     * >=100,000 COLONIES/mL STAPHYLOCOCCUS AUREUS  Urine culture     Status: None   Collection Time: 05/23/15  2:01 PM  Result Value Ref Range Status   Specimen Description KIDNEY RIGHT  Final   Special Requests NONE  Final   Culture   Final    >=100,000 COLONIES/mL YEAST Performed at [facility]    Report  Status 05/25/2015 FINAL  Final  Culture, blood (routine x 2)     Status: None (Preliminary result)   Collection Time: 05/23/15  9:26 PM  Result Value Ref Range Status   Specimen Description BLOOD RIGHT WRIST  Final   Special Requests   Final    BOTTLES DRAWN AEROBIC AND ANAEROBIC 10 CC BOTH BOTTLES   Culture   Final    NO GROWTH 3 DAYS Performed at [facility]    Report Status PENDING  Incomplete  Culture, blood (routine x 2)     Status: None (Preliminary result)   Collection Time: 05/23/15  9:30 PM  Result Value Ref Range Status   Specimen Description BLOOD RIGHT HAND  Final   Special Requests BOTTLES DRAWN AEROBIC ONLY 10 CC  Final   Culture   Final    NO GROWTH 3 DAYS Performed at [facility]    Report Status PENDING  Incomplete  C difficile quick scan w PCR reflex     Status: Abnormal   Collection Time: 05/24/15  7:27 PM  Result Value Ref Range Status   C Diff antigen POSITIVE (A) NEGATIVE Final   C Diff toxin NEGATIVE NEGATIVE Final   C Diff interpretation   Final    C. difficile present, but toxin not detected. This indicates colonization. In most cases, this does not require treatment. If patient has signs and symptoms consistent with colitis, consider treatment. Requires ENTERIC precautions.     Studies: No results found.  Scheduled Meds: . famotidine  20 mg Oral Daily  . feeding supplement (NEPRO CARB STEADY)  237 mL Oral BID BM  . latanoprost  1 drop Both Eyes QHS  . saccharomyces boulardii  250 mg Oral BID  . sodium bicarbonate  650 mg Oral BID  . sodium chloride  3 mL Intravenous Q12H  . timolol  1 drop Both Eyes q morning - 10a   Continuous Infusions:      Time spent:25 minutes    Elicia Lui, [place]  [facility] Pager [phone]. If 7PM-7AM, please contact night-coverage at [email or web address], password [facility] 05/26/2015, 2:56 PM  LOS: 4 days

## 2015-05-27 DIAGNOSIS — E43 Unspecified severe protein-calorie malnutrition: Secondary | ICD-10-CM | POA: Diagnosis present

## 2015-05-27 DIAGNOSIS — Z936 Other artificial openings of urinary tract status: Secondary | ICD-10-CM

## 2015-05-27 DIAGNOSIS — B9562 Methicillin resistant Staphylococcus aureus infection as the cause of diseases classified elsewhere: Secondary | ICD-10-CM

## 2015-05-27 LAB — BASIC METABOLIC PANEL
Anion gap: 11 (ref 5–15)
BUN: 62 mg/dL — ABNORMAL HIGH (ref 6–20)
CO2: 23 mmol/L (ref 22–32)
Calcium: 8 mg/dL — ABNORMAL LOW (ref 8.9–10.3)
Chloride: 102 mmol/L (ref 101–111)
Creatinine, Ser: 5.23 mg/dL — ABNORMAL HIGH (ref 0.44–1.00)
GFR calc Af Amer: 9 mL/min — ABNORMAL LOW (ref >60)
GFR calc non Af Amer: 8 mL/min — ABNORMAL LOW (ref >60)
Glucose, Bld: 99 mg/dL (ref 65–99)
Potassium: 3.9 mmol/L (ref 3.5–5.1)
Sodium: 136 mmol/L (ref 135–145)

## 2015-05-27 LAB — CULTURE, BLOOD (ROUTINE X 2)
Culture: NO GROWTH
Culture: NO GROWTH

## 2015-05-27 LAB — VANCOMYCIN, RANDOM: Vancomycin Rm: 14 ug/mL

## 2015-05-27 MED ORDER — VANCOMYCIN HCL IN DEXTROSE 750-5 MG/150ML-% IV SOLN
750.0000 mg | INTRAVENOUS | Status: DC
  Administered 2015-05-27 – 2015-05-29 (×2): 750 mg via INTRAVENOUS
  Filled 2015-05-27 (×2): qty 150

## 2015-05-27 NOTE — Progress Notes (Signed)
ANTIBIOTIC CONSULT NOTE - F/u  Pharmacy Consult Vancomycin Indication: rule out sepsis  Allergies  Allergen Reactions  . Vicodin [Hydrocodone-Acetaminophen] Nausea And Vomiting  . Percocet [Oxycodone-Acetaminophen] Nausea And Vomiting    Patient Measurements: Height: 5\' 3"  (160 cm) Weight: 171 lb (77.565 kg) IBW/kg (Calculated) : 52.4  Vital Signs: Temp: 98.4 F (36.9 C) (12/07 0527) Temp Source: Oral (12/07 0527) BP: 120/61 mmHg (12/07 0527) Pulse Rate: 95 (12/07 0527) Intake/Output from previous day: 12/06 0701 - 12/07 0700 In: 740 [P.O.:720] Out: 2550 [Urine:2550] Intake/Output from this shift:    Labs:  Recent Labs  05/25/15 0515 05/26/15 0545 05/27/15 0530  WBC 8.9 5.9  --   HGB 9.4* 9.6*  --   PLT 199 189  --   CREATININE 6.05* 5.49* 5.23*   Estimated Creatinine Clearance: 9.9 mL/min (by C-G formula based on Cr of 5.23).  Recent Labs  05/25/15 0515 05/27/15 0530  VANCORANDOM 18 14     Microbiology: Recent Results (from the past 720 hour(s))  Culture, blood (routine x 2)     Status: None   Collection Time: 05/02/15  6:18 PM  Result Value Ref Range Status   Specimen Description BLOOD LEFT FOREARM  7 ML IN YELLOW BOTTLE  Final   Special Requests NONE  Final   Culture   Final    NO GROWTH 5 DAYS Performed at [facility]    Report Status 05/08/2015 FINAL  Final  Culture, blood (routine x 2)     Status: None   Collection Time: 05/02/15  6:25 PM  Result Value Ref Range Status   Specimen Description BLOOD RIGHT ARM  2 ML IN YELLOW BOTTLE  Final   Special Requests NONE  Final   Culture   Final    NO GROWTH 5 DAYS Performed at [facility]    Report Status 05/08/2015 FINAL  Final  Urine culture     Status: None   Collection Time: 05/02/15  8:20 PM  Result Value Ref Range Status   Specimen Description URINE, CLEAN CATCH  Final   Special Requests NONE  Final   Culture   Final    MULTIPLE SPECIES PRESENT, SUGGEST  RECOLLECTION Performed at [facility]    Report Status 05/04/2015 FINAL  Final  Culture, blood (routine x 2)     Status: None (Preliminary result)   Collection Time: 05/22/15  6:58 PM  Result Value Ref Range Status   Specimen Description BLOOD RIGHT ANTECUBITAL  Final   Special Requests IN PEDIATRIC BOTTLE 4 ML  Final   Culture   Final    NO GROWTH 4 DAYS Performed at [facility]    Report Status PENDING  Incomplete  Culture, blood (routine x 2)     Status: None (Preliminary result)   Collection Time: 05/22/15  7:02 PM  Result Value Ref Range Status   Specimen Description BLOOD RIGHT HAND  Final   Special Requests IN PEDIATRIC BOTTLE 3 CC  Final   Culture   Final    NO GROWTH 4 DAYS Performed at [facility]    Report Status PENDING  Incomplete  Urine culture     Status: None   Collection Time: 05/22/15  7:56 PM  Result Value Ref Range Status   Specimen Description URINE, CLEAN CATCH  Final   Special Requests NONE  Final   Culture   Final    >=100,000 COLONIES/mL STAPHYLOCOCCUS AUREUS Performed at [facility]  Report Status 05/25/2015 FINAL  Final   Organism ID, Bacteria STAPHYLOCOCCUS AUREUS  Final      Susceptibility   Staphylococcus aureus - MIC*    CIPROFLOXACIN >=8 RESISTANT Resistant     GENTAMICIN <=0.5 SENSITIVE Sensitive     NITROFURANTOIN <=16 SENSITIVE Sensitive     OXACILLIN >=4 RESISTANT Resistant     TETRACYCLINE <=1 SENSITIVE Sensitive     VANCOMYCIN 1 SENSITIVE Sensitive     TRIMETH/SULFA <=10 SENSITIVE Sensitive     CLINDAMYCIN >=8 RESISTANT Resistant     RIFAMPIN <=0.5 SENSITIVE Sensitive     Inducible Clindamycin NEGATIVE Sensitive     * >=100,000 COLONIES/mL STAPHYLOCOCCUS AUREUS  Urine culture     Status: None   Collection Time: 05/23/15  2:01 PM  Result Value Ref Range Status   Specimen Description KIDNEY RIGHT  Final   Special Requests NONE  Final   Culture   Final    >=100,000 COLONIES/mL YEAST Performed  at [facility]    Report Status 05/25/2015 FINAL  Final  Culture, blood (routine x 2)     Status: None (Preliminary result)   Collection Time: 05/23/15  9:26 PM  Result Value Ref Range Status   Specimen Description BLOOD RIGHT WRIST  Final   Special Requests   Final    BOTTLES DRAWN AEROBIC AND ANAEROBIC 10 CC BOTH BOTTLES   Culture   Final    NO GROWTH 3 DAYS Performed at [facility]    Report Status PENDING  Incomplete  Culture, blood (routine x 2)     Status: None (Preliminary result)   Collection Time: 05/23/15  9:30 PM  Result Value Ref Range Status   Specimen Description BLOOD RIGHT HAND  Final   Special Requests BOTTLES DRAWN AEROBIC ONLY 10 CC  Final   Culture   Final    NO GROWTH 3 DAYS Performed at [facility]    Report Status PENDING  Incomplete  C difficile quick scan w PCR reflex     Status: Abnormal   Collection Time: 05/24/15  7:27 PM  Result Value Ref Range Status   C Diff antigen POSITIVE (A) NEGATIVE Final   C Diff toxin NEGATIVE NEGATIVE Final   C Diff interpretation   Final    C. difficile present, but toxin not detected. This indicates colonization. In most cases, this does not require treatment. If patient has signs and symptoms consistent with colitis, consider treatment. Requires ENTERIC precautions.    Medical History: Past Medical History  Diagnosis Date  . GERD (gastroesophageal reflux disease)   . Glaucoma   . H/O renal calculi 2002 & 2006  . H/O hiatal hernia   . Headache(784.0)     migraine  . Pneumonia     dx 10-06-2014 per CXR--  on 10-27-2014 pt states finished antibiotic and denies cough or fever  . Hyperlipidemia   . History of MI (myocardial infarction)     10/ 2013 in setting of Septic Shock  . History of atrial fibrillation     10/ 2013  in setting of Septic Shock  . History of CHF (congestive heart failure)     10/ 2013 in setting of septic shock  . Sigmoid diverticulosis   . Nephrolithiasis      bilateral  . CKD (chronic kidney disease), stage III     nephrologist-  dr Florene Glen  . Right ureteral stone   . History of acute respiratory failure     10/ 2013  --  ventilated in setting septic shock  . Dysrhythmia     Afib in 2013 when she had septic shock related to stone ureteral obstruction  . Myocardial infarction [facility])     Was reported in 2013 during hospitalization with septic shock  . Septic shock ([place]) 04/04/2012  . Sepsis ([place])   . Complication of anesthesia     use a little anesthesia , per patient MD states she quit breathing   . Hypertension     medication removed from regimen due to low blood pressure   . Peripheral vascular disease ([place])   . S/P hemodialysis catheter insertion ([place]) 04/11/2015     right anterior chest , only used once   . History of nephrostomy 04/11/2015     currently inplace 04/28/2015   . History of blood transfusion 04/13/2015    Assessment: Patient's a 69 y.o F with hx CKD stage 3 (being worked up outpatient for AVF placement) nephrolithiasis, bilateral hydronephrosis (status post nephrostomy in Nov 2016) who presented to the ED on 12/2 with c/o fever/fatigue and dysuria. UA was consistent with UTI.  Ceftriaxone started on admission.  Patient remains febrile with wbc trending up -- to change abx to zosyn for broad coverage.  - Tmax 103.1, wbc up 27.9; CKD stage 3, scr up 7.11 (crcl~<10) - LA 0.90 on 12/02 - 12/2 CXR: no active dz  12/2 CTX>> 12/3 12/3 zosyn>> 12/3 vancomycin >>  12/02 ucx:  12/02 bcx x2: NGTD 12/02 UA: amber color, turbid, >300 protein, positive nitrite and leukocytes  12/5 0500 VancR=18 after 1500 mg x1 12/3 @ 2130 (~32 hours) Scr improving slightly 6.73>6.05 Today, 12/7 0500 VancR=14 after 500mg  x1 on 12/5 @ 0800 (~ 48 hours) Scr improving 5.23   Goal of Therapy:  Eradication of infection Vancomycin trough 15-69mcg/ml Plan:  -Will start Vancomycin 750 mg IV q48h starting today - Continue zosyn 2.25 gm IV q8h -  f/u cx and renal function   [name] 05/27/2015 7:04 AM

## 2015-05-27 NOTE — Progress Notes (Signed)
Patient ID: [name], female   DOB: 05/26/1946, 69 y.o.   MRN: [id number]         Subjective: Patient feeling a little better today. Some soreness at right flank nephrostomy site.   Allergies: Vicodin and Percocet  Medications:  Current facility-administered medications:  .  acetaminophen (TYLENOL) tablet 650 mg, 650 mg, Oral, Q6H PRN, Reubin Milan, MD, 650 mg at 05/27/15 0518 .  dextromethorphan (DELSYM) 30 MG/5ML liquid 15 mg, 15 mg, Oral, Q8H PRN, Nishant Dhungel, MD .  diphenoxylate-atropine (LOMOTIL) 2.5-0.025 MG/5ML liquid 10 mL, 10 mL, Oral, QID PRN, Nishant Dhungel, MD, 10 mL at 05/27/15 0544 .  famotidine (PEPCID) tablet 20 mg, 20 mg, Oral, Daily, Nishant Dhungel, MD, 20 mg at 05/27/15 0939 .  feeding supplement (NEPRO CARB STEADY) liquid 237 mL, 237 mL, Oral, BID BM, Clayton Bibles, RD, 237 mL at 05/27/15 0939 .  latanoprost (XALATAN) 0.005 % ophthalmic solution 1 drop, 1 drop, Both Eyes, QHS, Reubin Milan, MD, 1 drop at 05/26/15 2203 .  loperamide (IMODIUM) capsule 4 mg, 4 mg, Oral, PRN, Nishant Dhungel, MD, 4 mg at 05/27/15 0900 .  ondansetron (ZOFRAN) tablet 4 mg, 4 mg, Oral, Q6H PRN **OR** ondansetron (ZOFRAN) injection 4 mg, 4 mg, Intravenous, Q6H PRN, Reubin Milan, MD, 4 mg at 05/26/15 0544 .  saccharomyces boulardii (FLORASTOR) capsule 250 mg, 250 mg, Oral, BID, Nishant Dhungel, MD, 250 mg at 05/27/15 0939 .  sodium bicarbonate tablet 650 mg, 650 mg, Oral, BID, Nishant Dhungel, MD, 650 mg at 05/27/15 0939 .  sodium chloride 0.9 % injection 3 mL, 3 mL, Intravenous, Q12H, Reubin Milan, MD, 3 mL at 05/27/15 0940 .  timolol (TIMOPTIC) 0.5 % ophthalmic solution 1 drop, 1 drop, Both Eyes, q morning - 10a, Reubin Milan, MD, 1 drop at 05/27/15 [phone] .  vancomycin (VANCOCIN) IVPB 750 mg/150 ml premix, 750 mg, Intravenous, Q48H, Dorrene German, RPH, 750 mg at 05/27/15 0829    Vital Signs: BP 120/61 mmHg  Pulse 95  Temp(Src) 98.4 F (36.9  C) (Oral)  Resp 20  Ht 5\' 3"  (1.6 m)  Wt 171 lb (77.565 kg)  BMI 30.30 kg/m2  SpO2 97%  LMP 01/19/1992  Physical Exam awake/alert. Right nephrostomy intact, insertion site okay, mildly tender to palpation, output  ~1100 recorded yesterday, >600 so far today yellow urine,   Imaging: Dg Chest Port 1 View  05/23/2015  CLINICAL DATA:  23-year-old female with fever. Patient denies chest complaint. EXAM: PORTABLE CHEST 1 VIEW COMPARISON:  Chest radiograph dated 05/22/2015 FINDINGS: A dialysis catheter is noted in stable positioning. There is an area of increased opacity in the left lung base may represent atelectatic changes. Pneumonia is not excluded. Clinical correlation recommended. Frontal and lateral views of the chest may provide better evaluation. There is no pleural effusion or pneumothorax. Stable cardiac silhouette. The osseous structures are grossly unremarkable. IMPRESSION: Left lung base subsegmental atelectasis. Developing pneumonia is not excluded. Clinical correlation is recommended. Electronically Signed   By: Anner Crete M.D.   On: 05/23/2015 23:24   Ir Nephrostomy Placement Right  05/23/2015  CLINICAL DATA:  Right-sided hydronephrosis with clinical evidence of pyelonephritis and urosepsis. Indwelling ureteral stent is present which is presumably obstructed. The patient requires emergent right-sided percutaneous nephrostomy to decompress the right kidney. History of prior right nephrostomy tube placement for similar reasons on 04/11/2015. This nephrostomy tube was removed on 05/06/2015. EXAM: 1. ULTRASOUND GUIDANCE FOR PUNCTURE OF THE RIGHT RENAL COLLECTING  SYSTEM. 2. RIGHT PERCUTANEOUS NEPHROSTOMY TUBE PLACEMENT. COMPARISON:  Renal ultrasound today prior nephrostomy tube placement imaging on 04/11/2015. ANESTHESIA/SEDATION: 2.0 mg IV Versed; 100 mcg IV Fentanyl. Total Moderate Sedation Time 12 minutes. CONTRAST:  5.0 ml Omnipaque 300 MEDICATIONS: 3.375 g IV Zosyn. Antibiotic was  administered in an appropriate time frame prior to skin puncture. FLUOROSCOPY TIME:  36 seconds. PROCEDURE: The procedure, risks, benefits, and alternatives were explained to the patient. Questions regarding the procedure were encouraged and answered. The patient understands and consents to the procedure. A time-out was performed prior to initiating the procedure. The right flank region was prepped with Betadine in a sterile fashion, and a sterile drape was applied covering the operative field. A sterile gown and sterile gloves were used for the procedure. Local anesthesia was provided with 1% Lidocaine. Ultrasound was used to localize the right kidney. Under direct ultrasound guidance, a 21 gauge needle was advanced into the renal collecting system. Ultrasound image documentation was performed. Aspiration of urine sample was performed followed by contrast injection. Aspirated urine sample was sent for culture analysis. A transitional dilator was advanced over a guidewire. Percutaneous tract dilatation was then performed over the guidewire. A 10 -French percutaneous nephrostomy tube was then advanced and formed in the collecting system. Catheter position was confirmed by fluoroscopy after contrast injection. The catheter was secured at the skin with a Prolene retention suture and Stat-Lock device. A gravity bag was placed. COMPLICATIONS: None. FINDINGS: Ultrasound demonstrates significant hydronephrosis. There was immediate return of grossly purulent fluid from the renal collecting system consistent with pyonephrosis. A 10 French nephrostomy tube was formed at the level of the renal pelvis. This will be left to gravity drainage. IMPRESSION: Placement of 10 French right percutaneous nephrostomy tube. Fluid return from the collecting system was grossly purulent consistent with pyonephrosis. A aspirated urine sample was sent for culture analysis. Electronically Signed   By: Aletta Edouard M.D.   On: 05/23/2015 14:48      Labs:  CBC:  Recent Labs  05/23/15 0602 05/24/15 0522 05/25/15 0515 05/26/15 0545  WBC 27.9* 28.6* 8.9 5.9  HGB 10.9* 10.1* 9.4* 9.6*  HCT 34.8* 33.1* 29.1* 30.1*  PLT 330 303 199 189    COAGS:  Recent Labs  11/23/14 0305 02/26/15 0015 05/04/15 0435 05/23/15 1450  INR 1.31 1.17 1.24 1.30  APTT  --  33  --   --     BMP:  Recent Labs  05/24/15 0522 05/25/15 0515 05/26/15 0545 05/27/15 0530  NA 131* 133* 135 136  K 5.4* 3.4* 3.3* 3.9  CL 108 99* 99* 102  CO2 10* 23 25 23   GLUCOSE 107* 126* 110* 99  BUN 94* 84* 73* 62*  CALCIUM 8.1* 7.5* 8.1* 8.0*  CREATININE 6.73* 6.05* 5.49* 5.23*  GFRNONAA 6* 6* 7* 8*  GFRAA 6* 7* 8* 9*    LIVER FUNCTION TESTS:  Recent Labs  05/22/15 1857 05/23/15 0602 05/24/15 0522 05/25/15 0515  BILITOT 0.7 0.5 0.4 0.7  AST 21 22 23 27   ALT 19 21 21 24   ALKPHOS 107 89 75 61  PROT 7.5 6.2* 5.5* 4.7*  ALBUMIN 3.4* 2.7* 2.4* 1.8*    Assessment and Plan: Pyelonephritis/urosepsis, right-sided hydronephrosis, presumably obstructed indwelling right ureteral stent; status post right percutaneous nephrostomy on 12/3; patient afebrile, WBC 5.9, creatinine 5.23(trending down  urine cultures with yeast and staph;  continue current treatment as outlined by urology.   SignedAscencion Dike 05/27/2015, 1:03 PM   I spent a  total of 15 minutes at the the patient's bedside AND on the patient's hospital floor or unit, greater than 50% of which was counseling/coordinating care for right percutaneous nephrostomy

## 2015-05-27 NOTE — Progress Notes (Signed)
[facility] PROGRESS NOTE  [name] [id number] DOB: 10-Dec-1945 DOA: 05/22/2015 PCP: [name], MD Brief narrative 69 year old female with recurrent nephrolithiasis with intermittent bilateral hydronephrosis requiring ureteral stent and right-sided nephrostomy, CKD stage V (has HD catheter,  scheduled for AV fistula placement next week) was recently hospitalized for sepsis secondary to pyelonephritis and underwent removal of right percutaneous nephrostomy and lateral double J stent placement with stent exchange and discharged home on antibiotics. Patient had nephrostomy tube placement on 04/11/2015 which was internalized right indwelling ureteral stent and nephrostomy tube was removed on 05/06/2015 during her recent hospitalization. Patient returned to the ED 12/2 with fevers with chills and temperature 103F, poor by mouth intake with dysuria, increased urinary frequency and mild right flank pain. She also found to have acute on chronic kidney disease with creatinine elevated up to 7.1. Patient admitted to hospitalist service for sepsis. Renal ultrasound done showing moderate right hydronephrosis with ureteral stent within the renal pelvis and bladder concerning for ureteral stent failure. Patient seen by urology and consulted IR for urgent right nephrostomy tube which was placed on 12/3.   Assessment/Plan: Severe sepsis Secondary to UTI/pyelonephritis with persistent hydronephrosis. - Received aggressive IV hydration. Placed foley to decompress the bladder. Blood cultures so far negative. Urine culture growing staph aureus. sensitive to vanco and tetracycline. Narrow abx to vanco ( d/c zosyn) . Marland Kitchen Sepsis resolved. Remains afebrile and patient symptomatically much better. -I have consulted ID regarding antibiotic course and duration. Given her recurrent sepsis with UTI urology had discussed earlier about using suppressive antibiotics. Family had the same concern. Given her  debility and history of C. difficile chronic suppressive antibiotic may not be appropriate. Will defer to ID.  Persistent hydronephrosis with recurrent urosepsis Renal ultrasound showing persistent hydronephrosis despite ureteral stent with good positioning.  emergent right nephrostomy tube placement by IR given ureteral stent failure. Recommend removal of the ureteral stent once sepsis resolves. Has good urine output and renal function improving. Appreciate urology recommendations. -Continue Foley until patient more mobile.  Acute on chronic kidney disease stage V with metabolic acidosis Possibly associated with dehydration and sepsis with associated hydronephrosis. Follows with Dr. Florene Glen. Has HD  catheter placed during prior hospitalization. ( received only 1 session of dialysis during that time was present. Scheduled for AV fistula placement next week. No uremic signs or symptoms.  -Creatinine improving after IV hydration and nephrostomy tube placed.. Patient renal follow-up.. Metabolic acidosis resolved with IV bicarbonate.    Diarrhea Possibly due to antibiotics. Continue florostar. Stool for C. difficile positive but negative for C. difficile antigen toxin. Added Imodium and Lomotil and much better today.  Anemia of chronic disease Hemoglobin stable  Chronic diastolic CHF Patient is hypovolemic.  Hypokalemia/hypomagnesemia Secondary to ongoing diarrhea. Replenished   GERD Continue PPI  Protein calorie malnutrition, severe Continue supplement. Encourage by mouth intake.  DVT prophylaxis: SCD Diet: Regular  Code Status: Full code Family Communication: None at bedside Disposition Plan: Continue telemetry monitoring. Pending PT evaluation. Possibly discharge by 12/9.  Consultants:  Urology  IR  Nephrology  Procedures:  Renal ultrasound   right nephrostomy by IR on 12/3  Antibiotics:  IV Rocephin 1  IV Zosyn 12/3-12/6  IV vancomycin  12/3--  HPI/Subjective: Patient reports feeling much better today. Diarrhea has remarkably improved. remains afebrile and appetite better.  Objective: Filed Vitals:   05/26/15 2218 05/27/15 0527  BP: 139/72 120/61  Pulse: 94 95  Temp: 98 F (36.7 C) 98.4 F (36.9 C)  Resp: 20 20    Intake/Output Summary (Last 24 hours) at 05/27/15 1238 Last data filed at 05/27/15 1200  Gross per 24 hour  Intake    503 ml  Output   2900 ml  Net  -2397 ml   Filed Weights   05/22/15 2012  Weight: 77.565 kg (171 lb)    Exam:   General:  Elderly female lying in bed not in distress. Appears much comfortable today.  HEENT:  moist oral mucosa, supple neck  Chest: Clear bilaterally  CVS: Normal S1 and S2, no murmurs rub or gallop  GI: Soft, nondistended, nontender, bowel sounds present, no flank tenderness, right nephrostomy site clean and draining clear urine. Foley placed on 12/3  Musculoskeletal: Warm, no edema  CNS: Alert and oriented  Data Reviewed: Basic Metabolic Panel:  Recent Labs Lab 05/23/15 0602 05/24/15 0522 05/25/15 0515 05/25/15 1450 05/26/15 0545 05/27/15 0530  NA 132* 131* 133*  --  135 136  K 5.4* 5.4* 3.4*  --  3.3* 3.9  CL 106 108 99*  --  99* 102  CO2 15* 10* 23  --  25 23  GLUCOSE 143* 107* 126*  --  110* 99  BUN 99* 94* 84*  --  73* 62*  CREATININE 7.11* 6.73* 6.05*  --  5.49* 5.23*  CALCIUM 8.7* 8.1* 7.5*  --  8.1* 8.0*  MG 1.9  --   --  1.6*  --   --    Liver Function Tests:  Recent Labs Lab 05/22/15 1857 05/23/15 0602 05/24/15 0522 05/25/15 0515  AST 21 22 23 27   ALT 19 21 21 24   ALKPHOS 107 89 75 61  BILITOT 0.7 0.5 0.4 0.7  PROT 7.5 6.2* 5.5* 4.7*  ALBUMIN 3.4* 2.7* 2.4* 1.8*   No results for input(s): LIPASE, AMYLASE in the last 168 hours. No results for input(s): AMMONIA in the last 168 hours. CBC:  Recent Labs Lab 05/22/15 1857 05/23/15 0602 05/24/15 0522 05/25/15 0515 05/26/15 0545  WBC 22.2* 27.9* 28.6* 8.9 5.9   NEUTROABS 21.1* 26.8* 28.0* 8.3*  --   HGB 12.9 10.9* 10.1* 9.4* 9.6*  HCT 41.4 34.8* 33.1* 29.1* 30.1*  MCV 90.2 89.5 91.2 86.6 86.5  PLT 367 330 303 199 189   Cardiac Enzymes: No results for input(s): CKTOTAL, CKMB, CKMBINDEX, TROPONINI in the last 168 hours. BNP (last 3 results)  Recent Labs  02/26/15 0530 05/23/15 0602  BNP 35.3 37.2    ProBNP (last 3 results) No results for input(s): PROBNP in the last 8760 hours.  CBG: No results for input(s): GLUCAP in the last 168 hours.  Recent Results (from the past 240 hour(s))  Culture, blood (routine x 2)     Status: None (Preliminary result)   Collection Time: 05/22/15  6:58 PM  Result Value Ref Range Status   Specimen Description BLOOD RIGHT ANTECUBITAL  Final   Special Requests IN PEDIATRIC BOTTLE 4 ML  Final   Culture   Final    NO GROWTH 4 DAYS Performed at [facility]    Report Status PENDING  Incomplete  Culture, blood (routine x 2)     Status: None (Preliminary result)   Collection Time: 05/22/15  7:02 PM  Result Value Ref Range Status   Specimen Description BLOOD RIGHT HAND  Final   Special Requests IN PEDIATRIC BOTTLE 3 CC  Final   Culture   Final    NO GROWTH 4 DAYS Performed at [facility]  Report Status PENDING  Incomplete  Urine culture     Status: None   Collection Time: 05/22/15  7:56 PM  Result Value Ref Range Status   Specimen Description URINE, CLEAN CATCH  Final   Special Requests NONE  Final   Culture   Final    >=100,000 COLONIES/mL STAPHYLOCOCCUS AUREUS Performed at [facility]    Report Status 05/25/2015 FINAL  Final   Organism ID, Bacteria STAPHYLOCOCCUS AUREUS  Final      Susceptibility   Staphylococcus aureus - MIC*    CIPROFLOXACIN >=8 RESISTANT Resistant     GENTAMICIN <=0.5 SENSITIVE Sensitive     NITROFURANTOIN <=16 SENSITIVE Sensitive     OXACILLIN >=4 RESISTANT Resistant     TETRACYCLINE <=1 SENSITIVE Sensitive     VANCOMYCIN 1 SENSITIVE Sensitive      TRIMETH/SULFA <=10 SENSITIVE Sensitive     CLINDAMYCIN >=8 RESISTANT Resistant     RIFAMPIN <=0.5 SENSITIVE Sensitive     Inducible Clindamycin NEGATIVE Sensitive     * >=100,000 COLONIES/mL STAPHYLOCOCCUS AUREUS  Urine culture     Status: None   Collection Time: 05/23/15  2:01 PM  Result Value Ref Range Status   Specimen Description KIDNEY RIGHT  Final   Special Requests NONE  Final   Culture   Final    >=100,000 COLONIES/mL YEAST Performed at [facility]    Report Status 05/25/2015 FINAL  Final  Culture, blood (routine x 2)     Status: None (Preliminary result)   Collection Time: 05/23/15  9:26 PM  Result Value Ref Range Status   Specimen Description BLOOD RIGHT WRIST  Final   Special Requests   Final    BOTTLES DRAWN AEROBIC AND ANAEROBIC 10 CC BOTH BOTTLES   Culture   Final    NO GROWTH 3 DAYS Performed at [facility]    Report Status PENDING  Incomplete  Culture, blood (routine x 2)     Status: None (Preliminary result)   Collection Time: 05/23/15  9:30 PM  Result Value Ref Range Status   Specimen Description BLOOD RIGHT HAND  Final   Special Requests BOTTLES DRAWN AEROBIC ONLY 10 CC  Final   Culture   Final    NO GROWTH 3 DAYS Performed at [facility]    Report Status PENDING  Incomplete  C difficile quick scan w PCR reflex     Status: Abnormal   Collection Time: 05/24/15  7:27 PM  Result Value Ref Range Status   C Diff antigen POSITIVE (A) NEGATIVE Final   C Diff toxin NEGATIVE NEGATIVE Final   C Diff interpretation   Final    C. difficile present, but toxin not detected. This indicates colonization. In most cases, this does not require treatment. If patient has signs and symptoms consistent with colitis, consider treatment. Requires ENTERIC precautions.     Studies: No results found.  Scheduled Meds: . famotidine  20 mg Oral Daily  . feeding supplement (NEPRO CARB STEADY)  237 mL Oral BID BM  . latanoprost  1 drop Both Eyes QHS   . saccharomyces boulardii  250 mg Oral BID  . sodium bicarbonate  650 mg Oral BID  . sodium chloride  3 mL Intravenous Q12H  . timolol  1 drop Both Eyes q morning - 10a  . vancomycin  750 mg Intravenous Q48H   Continuous Infusions:      Time spent:25 minutes    Demetre Monaco, [place]  [facility] Pager [phone].  If 7PM-7AM, please contact night-coverage at [email or web address], password [facility] 05/27/2015, 12:38 PM  LOS: 5 days

## 2015-05-27 NOTE — Consult Note (Signed)
[facility] for Infectious Disease       Reason for Consult: recurrent urinary infection    Referring Physician: Dr. Clementeen Graham  Principal Problem:   Acute pyelonephritis Active Problems:   Glaucoma   Essential hypertension, benign   CAD (coronary artery disease)   Acute-on-chronic kidney injury ([place])   End stage renal disease ([place])   Urinary tract infectious disease   Hyperkalemia   Hydronephrosis with urinary obstruction due to renal calculus   Metabolic acidosis   Hydronephrosis, right   Severe protein-calorie malnutrition ([place])   . famotidine  20 mg Oral Daily  . feeding supplement (NEPRO CARB STEADY)  237 mL Oral BID BM  . latanoprost  1 drop Both Eyes QHS  . saccharomyces boulardii  250 mg Oral BID  . sodium bicarbonate  650 mg Oral BID  . sodium chloride  3 mL Intravenous Q12H  . timolol  1 drop Both Eyes q morning - 10a  . vancomycin  750 mg Intravenous Q48H    Recommendations: Continue with vancomycin while inpatient Discharge on doxycycline 100 mg bid through 12/16     Assessment: She has urinary infection with nephrostomy tubes, urinary source.  Has grown MRSA.  Suppressive antibiotics are not indicated as there is no evidence that it prevents infections and only leads to resistance.  I discussed that with the patient.   ESRD not yet requiring dialysis.    Antibiotics: Zosyn >> vancomycin  HPI: [name] is a 69 y.o. female with nephrolithiasis, bilateral hydronephrosis, bilateral ureteral stents and ckd with plan for shunt placement next week who came in 03/2015 with pyelonephritis and acute on chronic ckd, and recurrent right hydronephrosis.  She has nephrostomy tube placed during that hospitalization and then internalized and the nephrostomy tube was removed in November.  She came in 12/3 with fever and recurrent sepsis syndrome of urinary origin and now has grown out MRSA in the urine, blood cultures have remained negative.  WBC was 28, fever  to 103 and initially started on zosyn then vancomycin added on the same day.  She improved overall and WBC now normal and afebrile.  Asked to comment on duration and if suppressive antibiotics indicated.   Review of Systems:  Constitutional: negative for fevers and chills Gastrointestinal: positive for nausea and vomiting Musculoskeletal: negative for myalgias and arthralgias All other systems reviewed and are negative   Past Medical History  Diagnosis Date  . GERD (gastroesophageal reflux disease)   . Glaucoma   . H/O renal calculi 2002 & 2006  . H/O hiatal hernia   . Headache(784.0)     migraine  . Pneumonia     dx 10-06-2014 per CXR--  on 10-27-2014 pt states finished antibiotic and denies cough or fever  . Hyperlipidemia   . History of MI (myocardial infarction)     10/ 2013 in setting of Septic Shock  . History of atrial fibrillation     10/ 2013  in setting of Septic Shock  . History of CHF (congestive heart failure)     10/ 2013 in setting of septic shock  . Sigmoid diverticulosis   . Nephrolithiasis     bilateral  . CKD (chronic kidney disease), stage III     nephrologist-  dr Florene Glen  . Right ureteral stone   . History of acute respiratory failure     10/ 2013  -- ventilated in setting septic shock  . Dysrhythmia     Afib in 2013 when she  had septic shock related to stone ureteral obstruction  . Myocardial infarction [facility])     Was reported in 2013 during hospitalization with septic shock  . Septic shock ([place]) 04/04/2012  . Sepsis ([place])   . Complication of anesthesia     use a little anesthesia , per patient MD states she quit breathing   . Hypertension     medication removed from regimen due to low blood pressure   . Peripheral vascular disease ([place])   . S/P hemodialysis catheter insertion ([place]) 04/11/2015     right anterior chest , only used once   . History of nephrostomy 04/11/2015     currently inplace 04/28/2015   . History of blood transfusion 04/13/2015       Social History  Substance Use Topics  . Smoking status: Never Smoker   . Smokeless tobacco: Never Used  . Alcohol Use: No    Family History  Problem Relation Age of Onset  . Hypertension Mother   . Cancer Mother 75    breast  . Dementia Mother   . Hypertension Brother   . Diabetes Brother   . Cancer Father 19    pancreatic  . Heart failure Paternal Grandmother   . Bladder Cancer Maternal Grandfather     Allergies  Allergen Reactions  . Vicodin [Hydrocodone-Acetaminophen] Nausea And Vomiting  . Percocet [Oxycodone-Acetaminophen] Nausea And Vomiting    Physical Exam: Constitutional: well developed and well nourished and alert  Filed Vitals:   05/26/15 2218 05/27/15 0527  BP: 139/72 120/61  Pulse: 94 95  Temp: 98 F (36.7 C) 98.4 F (36.9 C)  Resp: 20 20   EYES: anicteric ENMT: no thrush Cardiovascular: Cor RRR and No murmurs Respiratory: CTAB; normal respiratory effort GI: Bowel sounds are normal, liver is not enlarged, spleen is not enlarged Musculoskeletal: no pedal edema noted Skin: negatives: no rash Hematologic: no cervical lad  Lab Results  Component Value Date   WBC 5.9 05/26/2015   HGB 9.6* 05/26/2015   HCT 30.1* 05/26/2015   MCV 86.5 05/26/2015   PLT 189 05/26/2015    Lab Results  Component Value Date   CREATININE 5.23* 05/27/2015   BUN 62* 05/27/2015   NA 136 05/27/2015   K 3.9 05/27/2015   CL 102 05/27/2015   CO2 23 05/27/2015    Lab Results  Component Value Date   ALT 24 05/25/2015   AST 27 05/25/2015   ALKPHOS 61 05/25/2015     Microbiology: Recent Results (from the past 240 hour(s))  Culture, blood (routine x 2)     Status: None   Collection Time: 05/22/15  6:58 PM  Result Value Ref Range Status   Specimen Description BLOOD RIGHT ANTECUBITAL  Final   Special Requests IN PEDIATRIC BOTTLE 4 ML  Final   Culture   Final    NO GROWTH 5 DAYS Performed at [facility]    Report Status 05/27/2015 FINAL  Final   Culture, blood (routine x 2)     Status: None   Collection Time: 05/22/15  7:02 PM  Result Value Ref Range Status   Specimen Description BLOOD RIGHT HAND  Final   Special Requests IN PEDIATRIC BOTTLE 3 CC  Final   Culture   Final    NO GROWTH 5 DAYS Performed at [facility]    Report Status 05/27/2015 FINAL  Final  Urine culture     Status: None   Collection Time: 05/22/15  7:56 PM  Result Value  Ref Range Status   Specimen Description URINE, CLEAN CATCH  Final   Special Requests NONE  Final   Culture   Final    >=100,000 COLONIES/mL STAPHYLOCOCCUS AUREUS Performed at [facility]    Report Status 05/25/2015 FINAL  Final   Organism ID, Bacteria STAPHYLOCOCCUS AUREUS  Final      Susceptibility   Staphylococcus aureus - MIC*    CIPROFLOXACIN >=8 RESISTANT Resistant     GENTAMICIN <=0.5 SENSITIVE Sensitive     NITROFURANTOIN <=16 SENSITIVE Sensitive     OXACILLIN >=4 RESISTANT Resistant     TETRACYCLINE <=1 SENSITIVE Sensitive     VANCOMYCIN 1 SENSITIVE Sensitive     TRIMETH/SULFA <=10 SENSITIVE Sensitive     CLINDAMYCIN >=8 RESISTANT Resistant     RIFAMPIN <=0.5 SENSITIVE Sensitive     Inducible Clindamycin NEGATIVE Sensitive     * >=100,000 COLONIES/mL STAPHYLOCOCCUS AUREUS  Urine culture     Status: None   Collection Time: 05/23/15  2:01 PM  Result Value Ref Range Status   Specimen Description KIDNEY RIGHT  Final   Special Requests NONE  Final   Culture   Final    >=100,000 COLONIES/mL YEAST Performed at [facility]    Report Status 05/25/2015 FINAL  Final  Culture, blood (routine x 2)     Status: None (Preliminary result)   Collection Time: 05/23/15  9:26 PM  Result Value Ref Range Status   Specimen Description BLOOD RIGHT WRIST  Final   Special Requests   Final    BOTTLES DRAWN AEROBIC AND ANAEROBIC 10 CC BOTH BOTTLES   Culture   Final    NO GROWTH 4 DAYS Performed at [facility]    Report Status PENDING  Incomplete  Culture,  blood (routine x 2)     Status: None (Preliminary result)   Collection Time: 05/23/15  9:30 PM  Result Value Ref Range Status   Specimen Description BLOOD RIGHT HAND  Final   Special Requests BOTTLES DRAWN AEROBIC ONLY 10 CC  Final   Culture   Final    NO GROWTH 4 DAYS Performed at [facility]    Report Status PENDING  Incomplete  C difficile quick scan w PCR reflex     Status: Abnormal   Collection Time: 05/24/15  7:27 PM  Result Value Ref Range Status   C Diff antigen POSITIVE (A) NEGATIVE Final   C Diff toxin NEGATIVE NEGATIVE Final   C Diff interpretation   Final    C. difficile present, but toxin not detected. This indicates colonization. In most cases, this does not require treatment. If patient has signs and symptoms consistent with colitis, consider treatment. Requires ENTERIC precautions.    Scharlene Gloss, [facility] for Infectious Disease [place] [email or web address] [phone] pager  [phone] cell 05/27/2015, 2:41 PM

## 2015-05-27 NOTE — Progress Notes (Signed)
[facility]  Patient Status: Active (receiving services up to time of hospitalization)  [facility] is providing the following services: RN  If patient discharges after hours, please call [phone].   [name] 05/27/2015, 9:41 AM

## 2015-05-27 NOTE — Evaluation (Signed)
Physical Therapy Evaluation Patient Details Name: [name] MRN: [id number] DOB: [birth year] Today's Date: 05/27/2015   History of Present Illness  69 yo female admitted with acute pyelonephritis, sepsis. S/P perc nephrostomy 05/23/15. Hx of MI, A fib, CAD, CHF, CKD, HTN  Clinical Impression  On eval, pt required Min guard-Min assist for mobility. Pt was agreeable to sitting EOB, performing LE exercises and standing to allow for OOB/weightbearing. Pt did not feel able to walk on today.Pt is generally weak and performance was limited by nausea as well. Discussed d/c plan-pt states she plans to return home with daughter assisting as needed. At this time, feel pt will require 24 hour supervision/assist and HHPT. Encouraged pt to mobilize more with nursing (bed>chair/BSC transfers). Will follow and progress activity as able.     Follow Up Recommendations Home health PT;Supervision/Assistance - 24 hour (provided mobility and activity tolerance improve)    Equipment Recommendations  None recommended by PT    Recommendations for Other Services       Precautions / Restrictions Precautions Precautions: Fall Precaution Comments: r flank drain Restrictions Weight Bearing Restrictions: No      Mobility  Bed Mobility Overal bed mobility: Needs Assistance Bed Mobility: Supine to Sit;Rolling;Sit to Supine Rolling: Modified independent (Device/Increase time)   Supine to sit: Min guard Sit to supine: Min guard   General bed mobility comments: close guard for safety, lines. Increased time.   Transfers Overall transfer level: Needs assistance Equipment used: Rolling walker (2 wheeled) Transfers: Sit to/from Stand Sit to Stand: Min assist         General transfer comment: small amount of assist to stabilize.   Ambulation/Gait             General Gait Details: Pt declined ambulation due to nausea. Pt was able to take a few side steps along side of bed with RW. Increased time.    Stairs            Wheelchair Mobility    Modified Rankin (Stroke Patients Only)       Balance Overall balance assessment: Needs assistance         Standing balance support: During functional activity Standing balance-Leahy Scale: Fair                               Pertinent Vitals/Pain Pain Assessment: No/denies pain    Home Living Family/patient expects to be discharged to:: Private residence Living Arrangements: Children Available Help at Discharge: Family Type of Home: House Home Access: Stairs to enter Entrance Stairs-Rails: Right   Home Layout: One level Home Equipment: Cane - single point;Walker - 4 wheels;Tub bench      Prior Function Level of Independence: Independent               Hand Dominance        Extremity/Trunk Assessment   Upper Extremity Assessment: Generalized weakness           Lower Extremity Assessment: Generalized weakness      Cervical / Trunk Assessment: Normal  Communication   Communication: No difficulties  Cognition Arousal/Alertness: Awake/alert Behavior During Therapy: WFL for tasks assessed/performed Overall Cognitive Status: Within Functional Limits for tasks assessed                      General Comments      Exercises General Exercises - Lower Extremity Ankle Circles/Pumps: AROM;Both;10 reps;Seated Long Arc Quad:  AROM;Both;10 reps;Seated      Assessment/Plan    PT Assessment Patient needs continued PT services  PT Diagnosis Difficulty walking;Generalized weakness   PT Problem List Decreased strength;Decreased activity tolerance;Decreased balance;Decreased mobility;Decreased knowledge of use of DME  PT Treatment Interventions DME instruction;Gait training;Functional mobility training;Therapeutic activities;Patient/family education;Balance training;Therapeutic exercise   PT Goals (Current goals can be found in the Care Plan section) Acute Rehab PT Goals Patient Stated  Goal: to feel better and move more PT Goal Formulation: With patient Time For Goal Achievement: 06/10/15 Potential to Achieve Goals: Good    Frequency Min 3X/week   Barriers to discharge        Co-evaluation               End of Session   Activity Tolerance: Patient limited by fatigue Patient left: in bed;with call bell/phone within reach;with bed alarm set           Time: [id number] PT Time Calculation (min) (ACUTE ONLY): 25 min   Charges:   PT Evaluation $Initial PT Evaluation Tier I: 1 Procedure PT Treatments $Therapeutic Activity: 8-22 mins   PT G Codes:        Weston Anna, MPT Pager: [phone]

## 2015-05-28 DIAGNOSIS — E872 Acidosis: Secondary | ICD-10-CM

## 2015-05-28 DIAGNOSIS — N179 Acute kidney failure, unspecified: Secondary | ICD-10-CM

## 2015-05-28 DIAGNOSIS — N189 Chronic kidney disease, unspecified: Secondary | ICD-10-CM

## 2015-05-28 DIAGNOSIS — A419 Sepsis, unspecified organism: Secondary | ICD-10-CM

## 2015-05-28 DIAGNOSIS — N132 Hydronephrosis with renal and ureteral calculous obstruction: Secondary | ICD-10-CM

## 2015-05-28 DIAGNOSIS — N10 Acute tubulo-interstitial nephritis: Secondary | ICD-10-CM

## 2015-05-28 DIAGNOSIS — N185 Chronic kidney disease, stage 5: Secondary | ICD-10-CM

## 2015-05-28 LAB — CULTURE, BLOOD (ROUTINE X 2)
Culture: NO GROWTH
Culture: NO GROWTH

## 2015-05-28 MED ORDER — NEPRO/CARBSTEADY PO LIQD
237.0000 mL | Freq: Every morning | ORAL | Status: DC
  Administered 2015-05-29 – 2015-05-31 (×3): 237 mL via ORAL
  Filled 2015-05-28 (×3): qty 237

## 2015-05-28 NOTE — Progress Notes (Addendum)
Patient ID: [name], female   DOB: December 26, 1945, 69 y.o.   MRN: [id number] [facility] PROGRESS NOTE  [name] [id number] DOB: 02/28/1946 DOA: 05/22/2015 PCP: [name], MD  Brief narrative:    69 year old female with past medical history of recurrent nephrolithiasis with intermittent bilateral hydronephrosis requiring ureteral stent and right-sided nephrostomy, CKD stage V (has HD catheter,scheduled for AV fistula placement next week), recently hospitalized for sepsis secondary to pyelonephritis at which time she underwent removal of right percutaneous nephrostomy. She then had double J stent placement with stent exchange and was subsequently discharged home on antibiotics.   Patient returned to the ED 05/22/2015 with fevers,  chills and temperature of 103F. She also had dysuria and flank pain.Her Cr was elevated at 7.1. She was admitted for sepsis management. Renal ultrasound done showing moderate right hydronephrosis with ureteral stent within the renal pelvis and bladder concerning for ureteral stent failure. Patient has been seen by urology and IR for urgent right nephrostomy tube placement which was done 05/23/2015.  Assessment/Plan:    Principal Problem: Severe sepsis - In the setting of UTI / pyelonephritis with persistent hydronephrosis - Blood cultures negative so far - Urine culture growing staph species - Currently on vancomycin renally doses  - ID consulted, appreciate their recommendation   Active Problems: Persistent hydronephrosis with recurrent urosepsis - Renal ultrasound demonstrated persistent hydronephrosis despite ureteral stent with good positioning.  - Pt underwent emergent right nephrostomy tube placement by IR given ureteral stent failure. Recommended to remove the ureteral stent once sepsis resolves. - Also has foley catheter - Appreciate GU following   Acute on chronic kidney disease stage V with metabolic acidosis - In the  setting of dehydration and sepsis with associated hydronephrosis.  - Follows with Dr. Florene Glen. Has HDcatheter placed during prior hospitalization - No uremic signs or symptoms.  - Continue to monitor renal function  Diarrhea - Secondary to abx therapy - Continue florastor - Stool for C. difficile positive but negative for C. difficile antigen toxin.  Anemia of chronic disease - Due to anemia of chronic renal failure - Hemoglobin is 9.6  - No current indications for transfusion   Chronic diastolic CHF - 2 D ECHO in 11/2014 with LVH, normal EF - Compensated   Hypokalemia / hypomagnesemia - Due to GI losses - Supplemented   GERD - Continue Pepcid   Protein calorie malnutrition, severe - IN the context of acute illness - Nutrition consulted   DVT prophylaxis - SCD's bilaterally   Code Status: Full.  Family Communication:  plan of care discussed with the patient and her daughter at the bedside  Disposition Plan: Likely by 05/31/2015    IV access:  Peripheral IV  Procedures and diagnostic studies:    Renal ultrasound Right nephrostomy by IR on 12/3  Medical Consultants:  Urology Nephrology IR  Other Consultants:  PT Nutrition  IAnti-Infectives:   Rocephin 1 Zosyn 12/3-12/6 Vancomycin 12/3 -->    Leisa Lenz, MD  [facility] Pager [phone]  Time spent in minutes: 25 minutes  If 7PM-7AM, please contact night-coverage [email or web address] Password [email or web address] 05/28/2015, 1:47 PM   LOS: 6 days    HPI/Subjective: No acute overnight events. Patient reports she feels better.   Objective: Filed Vitals:   05/27/15 0527 05/27/15 1435 05/27/15 2248 05/28/15 0611  BP: 120/61 138/64 126/73 126/86  Pulse: 95 96 92 96  Temp: 98.4 F (36.9 C) 98.4 F (36.9 C) 98.5 F (36.9 C) 98 F (36.7 C)  TempSrc: Oral Oral Oral Oral  Resp: 20 20 20 20   Height:      Weight:      SpO2: 97% 98% 97% 96%    Intake/Output Summary (Last 24 hours) at 05/28/15 1347 Last  data filed at 05/28/15 0904  Gross per 24 hour  Intake    817 ml  Output   2175 ml  Net  -1358 ml    Exam:   General:  Pt is alert, follows commands appropriately, not in acute distress  Cardiovascular: Regular rate and rhythm, S1/S2 appreciated   Respiratory: Clear to auscultation bilaterally, no wheezing, no crackles, no rhonchi  Abdomen: Soft, non tender, non distended, bowel sounds present; has right nephrostomy (+)  Extremities: No edema, pulses DP and PT palpable bilaterally  Neuro: Grossly nonfocal  Data Reviewed: Basic Metabolic Panel:  Recent Labs Lab 05/23/15 0602 05/24/15 0522 05/25/15 0515 05/25/15 1450 05/26/15 0545 05/27/15 0530  NA 132* 131* 133*  --  135 136  K 5.4* 5.4* 3.4*  --  3.3* 3.9  CL 106 108 99*  --  99* 102  CO2 15* 10* 23  --  25 23  GLUCOSE 143* 107* 126*  --  110* 99  BUN 99* 94* 84*  --  73* 62*  CREATININE 7.11* 6.73* 6.05*  --  5.49* 5.23*  CALCIUM 8.7* 8.1* 7.5*  --  8.1* 8.0*  MG 1.9  --   --  1.6*  --   --    Liver Function Tests:  Recent Labs Lab 05/22/15 1857 05/23/15 0602 05/24/15 0522 05/25/15 0515  AST 21 22 23 27   ALT 19 21 21 24   ALKPHOS 107 89 75 61  BILITOT 0.7 0.5 0.4 0.7  PROT 7.5 6.2* 5.5* 4.7*  ALBUMIN 3.4* 2.7* 2.4* 1.8*   No results for input(s): LIPASE, AMYLASE in the last 168 hours. No results for input(s): AMMONIA in the last 168 hours. CBC:  Recent Labs Lab 05/22/15 1857 05/23/15 0602 05/24/15 0522 05/25/15 0515 05/26/15 0545  WBC 22.2* 27.9* 28.6* 8.9 5.9  NEUTROABS 21.1* 26.8* 28.0* 8.3*  --   HGB 12.9 10.9* 10.1* 9.4* 9.6*  HCT 41.4 34.8* 33.1* 29.1* 30.1*  MCV 90.2 89.5 91.2 86.6 86.5  PLT 367 330 303 199 189   Cardiac Enzymes: No results for input(s): CKTOTAL, CKMB, CKMBINDEX, TROPONINI in the last 168 hours. BNP: Invalid input(s): POCBNP CBG: No results for input(s): GLUCAP in the last 168 hours.  Culture, blood (routine x 2)     Status: None   Collection Time: 05/22/15   6:58 PM  Result Value Ref Range Status   Specimen Description BLOOD RIGHT ANTECUBITAL  Final   Special Requests IN PEDIATRIC BOTTLE 4 ML  Final   Culture   Final    NO GROWTH 5 DAYS Performed at [facility]    Report Status 05/27/2015 FINAL  Final  Culture, blood (routine x 2)     Status: None   Collection Time: 05/22/15  7:02 PM  Result Value Ref Range Status   Specimen Description BLOOD RIGHT HAND  Final   Special Requests IN PEDIATRIC BOTTLE 3 CC  Final   Culture   Final    NO GROWTH 5 DAYS Performed at [facility]    Report Status 05/27/2015 FINAL  Final  Urine culture     Status: None   Collection Time: 05/22/15  7:56 PM  Result Value Ref Range Status   Specimen Description URINE, CLEAN CATCH  Final   Special Requests NONE  Final   Culture   Final   Report Status 05/25/2015 FINAL  Final   Organism ID, Bacteria STAPHYLOCOCCUS AUREUS  Final      Susceptibility   Staphylococcus aureus - MIC*    CIPROFLOXACIN >=8 RESISTANT Resistant     GENTAMICIN <=0.5 SENSITIVE Sensitive     NITROFURANTOIN <=16 SENSITIVE Sensitive     OXACILLIN >=4 RESISTANT Resistant     TETRACYCLINE <=1 SENSITIVE Sensitive     VANCOMYCIN 1 SENSITIVE Sensitive     TRIMETH/SULFA <=10 SENSITIVE Sensitive     CLINDAMYCIN >=8 RESISTANT Resistant     RIFAMPIN <=0.5 SENSITIVE Sensitive     Inducible Clindamycin NEGATIVE Sensitive     * >=100,000 COLONIES/mL STAPHYLOCOCCUS AUREUS  Urine culture     Status: None   Collection Time: 05/23/15  2:01 PM  Result Value Ref Range Status   Specimen Description KIDNEY RIGHT  Final   Special Requests NONE  Final   Culture   Final    >=100,000 COLONIES/mL YEAST Performed at [facility]    Report Status 05/25/2015 FINAL  Final  Culture, blood (routine x 2)     Status: None   Collection Time: 05/23/15  9:26 PM  Result Value Ref Range Status   Specimen Description BLOOD RIGHT WRIST  Final   Special Requests   Final   Culture   Final    NO  GROWTH 5 DAYS Performed at [facility]    Report Status 05/28/2015 FINAL  Final  Culture, blood (routine x 2)     Status: None   Collection Time: 05/23/15  9:30 PM  Result Value Ref Range Status   Specimen Description BLOOD RIGHT HAND  Final   Culture   Final    NO GROWTH 5 DAYS Performed at [facility]    Report Status 05/28/2015 FINAL  Final  C difficile quick scan w PCR reflex     Status: Abnormal   Collection Time: 05/24/15  7:27 PM  Result Value Ref Range Status   C Diff antigen POSITIVE (A) NEGATIVE Final   C Diff toxin NEGATIVE NEGATIVE Final   C Diff interpretation   Final    C. difficile present, but toxin not detected. This indicates colonization. In most cases, this does not require treatment. If patient has signs and symptoms consistent with colitis, consider treatment. Requires ENTERIC precautions.     Scheduled Meds: . famotidine  20 mg Oral Daily  . [START ON 05/29/2015] feeding supplement (NEPRO CARB STEADY)  237 mL Oral q morning - 10a  . latanoprost  1 drop Both Eyes QHS  . saccharomyces boulardii  250 mg Oral BID  . sodium bicarbonate  650 mg Oral BID  . sodium chloride  3 mL Intravenous Q12H  . timolol  1 drop Both Eyes q morning - 10a  . vancomycin  750 mg Intravenous Q48H   Continuous Infusions:

## 2015-05-28 NOTE — Progress Notes (Signed)
Physical Therapy Treatment Patient Details Name: [name] MRN: [id number] DOB: 02/14/1946 Today's Date: 05/28/2015    History of Present Illness 69 yo female admitted with acute pyelonephritis, sepsis. S/P perc nephrostomy 05/23/15. Hx of MI, A fib, CAD, CHF, CKD, HTN    PT Comments    Progressing with mobility.   Follow Up Recommendations  Home health PT;Supervision - Intermittent     Equipment Recommendations  None recommended by PT    Recommendations for Other Services       Precautions / Restrictions Precautions Precautions: Fall Precaution Comments: r flank drain Restrictions Weight Bearing Restrictions: No    Mobility  Bed Mobility Overal bed mobility: Needs Assistance Bed Mobility: Supine to Sit;Sit to Supine     Supine to sit: Supervision Sit to supine: Supervision   General bed mobility comments: safety, lines. Increased time.   Transfers Overall transfer level: Needs assistance Equipment used: Rolling walker (2 wheeled) Transfers: Sit to/from Stand Sit to Stand: Min guard         General transfer comment: close guard for safety  Ambulation/Gait Ambulation/Gait assistance: Min guard Ambulation Distance (Feet): 400 Feet Assistive device: Rolling walker (2 wheeled) Gait Pattern/deviations: Step-through pattern;Decreased stride length     General Gait Details: close guard for safety. Pt tolerated distance well.    Stairs            Wheelchair Mobility    Modified Rankin (Stroke Patients Only)       Balance                                    Cognition Arousal/Alertness: Awake/alert Behavior During Therapy: WFL for tasks assessed/performed Overall Cognitive Status: Within Functional Limits for tasks assessed                      Exercises      General Comments        Pertinent Vitals/Pain Pain Assessment: No/denies pain    Home Living                      Prior Function           PT Goals (current goals can now be found in the care plan section) Progress towards PT goals: Progressing toward goals    Frequency  Min 3X/week    PT Plan Current plan remains appropriate    Co-evaluation             End of Session Equipment Utilized During Treatment: Gait belt Activity Tolerance: Patient tolerated treatment well Patient left: in chair;with call bell/phone within reach     Time: 1041-1058 PT Time Calculation (min) (ACUTE ONLY): 17 min  Charges:  $Gait Training: 8-22 mins                    G Codes:      Weston Anna, MPT Pager: [phone]

## 2015-05-28 NOTE — Progress Notes (Signed)
Urology Progress Note  Right JJ stent and Right perc.  Less confusion. Infection clearing.   Subjective:     No acute urologic events overnight. Ambulation:   positive Flatus:    positive Bowel movement  positive  Pain: some relief  Objective:  Blood pressure 126/86, pulse 96, temperature 98 F (36.7 C), temperature source Oral, resp. rate 20, height 5\' 3"  (1.6 m), weight 77.565 kg (171 lb), last menstrual period 01/19/1992, SpO2 96 %.  Physical Exam:  General:  No acute distress, awake  Genitourinary:   Perc draining well.  Foley: draining    I/O last 3 completed shifts: In: [id number] [P.O.:657; I.V.:93; Other:20; IV Piggyback:150] Out: 3700 [Urine:3700]  Recent Labs     05/26/15  0545  HGB  9.6*  WBC  5.9  PLT  189    Recent Labs     05/26/15  0545  05/27/15  0530  NA  135  136  K  3.3*  3.9  CL  99*  102  CO2  25  23  BUN  73*  62*  CREATININE  5.49*  5.23*  CALCIUM  8.1*  8.0*  GFRNONAA  7*  8*  GFRAA  8*  9*     No results for input(s): INR, APTT in the last 72 hours.  Invalid input(s): PT   Invalid input(s): ABG  Assessment/Plan:  Wound care discussed with nurse. Take pressure off perc dressing.

## 2015-05-28 NOTE — Progress Notes (Signed)
Nutrition Follow-up  DOCUMENTATION CODES:   Severe malnutrition in context of chronic illness  INTERVENTION:  - Continue Nepro Shake but will decrease from BID to once/day - Encourage PO intakes at meals - RD will continue to monitor for needs  NUTRITION DIAGNOSIS:   Inadequate oral intake related to poor appetite as evidenced by per patient/family report. -improving  GOAL:   Patient will meet greater than or equal to 90% of their needs -likely minimally met  MONITOR:   PO intake, Supplement acceptance, Labs, Weight trends, Skin, I & O's  ASSESSMENT:   69 year old female with recurrent peripheral lithiasis with intermittent bilateral hydronephrosis requiring ureteral stent and right-sided nephrostomy, CKD stage V (has HD catheter, scheduled for AV fistula placement next week) was recently hospitalized for sepsis secondary to pyelonephritis and underwent removal of right percutaneous nephrostomy and lateral double J stent placement with stent exchange and discharged home on antibiotics. Patient had nephrostomy tube placement on 04/11/2015 which was internalized right indwelling ureteral stent and nephrostomy tube was removed on 05/06/2015 during her recent hospitalization. Patient returned to the ED 12/2 with fevers with chills and temperature 103F, poor by mouth intake with dysuria, increased urinary frequency and mild right flank pain.  12/8 Per chart review, pt ate 75% of breakfast 12/6 and 100% of breakfast this AM with no other intakes documented since previous assessment. Pt reports that her appetite has slowly been improving and she has not been experiencing nausea or abdominal pain with intakes. She did have one episode of nausea after drinking Nepro and then attempting to get up with PT yesterday afternoon.  Pt states that her daughter brought her a biscuit with jelly for breakfast and that she also drank orange juice and coffee for this meal. She reports drinking 1 Nepro per  day and tolerates this well.   Pt likely meeting minimal nutrition-related needs recently. Medications reviewed. Labs reviewed; BUN/creatinine elevated but trending down, Ca: 8 mg/dL, GFR: 8, Mg: 1.6 mg/dL, K WDL, no Phos labs available.    12/4 - Patient with poor appetite and intake that has persisted over the last couple of months. - During a previous admission she was receiving temporary HD.  - No longer receiving HD.  - Pt on renal diet with 2L fluid restriction. Has been ordered Nepro shakes. - Patient with 28 lb weight loss since 9/11 (14% weight loss x 3 months, significant for time frame). - No muscle or fat wasting noticed.   Diet Order:  Diet regular Room service appropriate?: Yes; Fluid consistency:: Thin  Skin:  Wound (see comment) (perineum incision)  Last BM:  12/8  Height:   Ht Readings from Last 1 Encounters:  05/22/15 $RemoveB'5\' 3"'zpivqxed$  (1.6 m)    Weight:   Wt Readings from Last 1 Encounters:  05/22/15 171 lb (77.565 kg)    Ideal Body Weight:  52.3 kg  BMI:  Body mass index is 30.3 kg/(m^2).  Estimated Nutritional Needs:   Kcal:  1900-2100  Protein:  90-100g  Fluid:  2L/day  EDUCATION NEEDS:   No education needs identified at this time     Jarome Matin, RD, LDN Inpatient Clinical Dietitian Pager # [phone] After hours/weekend pager # [phone]

## 2015-05-29 ENCOUNTER — Other Ambulatory Visit: Payer: Self-pay | Admitting: Radiology

## 2015-05-29 ENCOUNTER — Encounter (HOSPITAL_COMMUNITY): Payer: Medicare Other

## 2015-05-29 DIAGNOSIS — N1330 Unspecified hydronephrosis: Secondary | ICD-10-CM

## 2015-05-29 DIAGNOSIS — E43 Unspecified severe protein-calorie malnutrition: Secondary | ICD-10-CM

## 2015-05-29 DIAGNOSIS — A419 Sepsis, unspecified organism: Secondary | ICD-10-CM | POA: Insufficient documentation

## 2015-05-29 DIAGNOSIS — R6520 Severe sepsis without septic shock: Secondary | ICD-10-CM

## 2015-05-29 MED ORDER — HEPARIN SODIUM (PORCINE) 1000 UNIT/ML IJ SOLN
1000.0000 [IU] | Freq: Once | INTRAMUSCULAR | Status: AC
  Administered 2015-05-29: 1000 [IU] via INTRAVENOUS
  Filled 2015-05-29: qty 1

## 2015-05-29 NOTE — Care Management Important Message (Signed)
Important Message  Patient Details IM Letter given to [facility] to present to [place] Message  Patient Details  Name: KIERRIA WHIRLEY MRN: [id number] Date of Birth: [birth year]   Medicare Important Message Given:  Yes    Camillo Flaming 05/29/2015, 12:12 PM Name: AALYCIA CLUNEY MRN: [id number] Date of Birth: 08/10/1945   Medicare Important Message Given:  Yes    Camillo Flaming 05/29/2015, 12:11 PM

## 2015-05-29 NOTE — Progress Notes (Signed)
Referring Physician(s): Urology  Chief Complaint: Right hydronephrosis s/p right PCN 05/23/15  Subjective: Patient is doing well without complaints. She states she would like to keep PCN for awhile because she has minimal tenderness with it and does not want to go back through her recent situation with infections  Allergies: Vicodin and Percocet  Medications: Prior to Admission medications   Medication Sig Start Date End Date Taking? Authorizing Provider  acetaminophen (TYLENOL) 500 MG tablet Take 500 mg by mouth every 6 (six) hours as needed for moderate pain.   Yes Historical Provider, MD  acidophilus (RISAQUAD) CAPS capsule Take 2 capsules by mouth daily. 05/07/15  Yes Maryann Mikhail, DO  bimatoprost (LUMIGAN) 0.01 % SOLN Place 1 drop into both eyes at bedtime.   Yes Historical Provider, MD  ENSURE PLUS (ENSURE PLUS) LIQD Take 237 mLs by mouth daily as needed (nutritional supplement).    Yes Historical Provider, MD  Nutritional Supplements (FEEDING SUPPLEMENT, NEPRO CARB STEADY,) LIQD Take 237 mLs by mouth daily. 05/07/15  Yes Maryann Mikhail, DO  ranitidine (ZANTAC) 150 MG tablet Take 150 mg by mouth 2 (two) times daily as needed for heartburn.   Yes Historical Provider, MD  timolol (BETIMOL) 0.5 % ophthalmic solution Place 1 drop into both eyes every morning.    Yes Historical Provider, MD  amoxicillin-clavulanate (AUGMENTIN) 250-125 MG tablet Take 1 tablet by mouth 2 (two) times daily. Patient not taking: Reported on 05/20/2015 05/07/15   Velta Addison Mikhail, DO  HYDROcodone-acetaminophen (NORCO/VICODIN) 5-325 MG tablet Take 1 tablet by mouth 4 (four) times daily as needed for moderate pain.  04/27/15   Historical Provider, MD  saccharomyces boulardii (FLORASTOR) 250 MG capsule Take 1 capsule (250 mg total) by mouth 2 (two) times daily. Patient not taking: Reported on 05/22/2015 05/07/15   Velta Addison Mikhail, DO  sodium chloride (OCEAN) 0.65 % SOLN nasal spray Place 1 spray into both  nostrils 3 (three) times daily as needed for congestion.    Historical Provider, MD    Vital Signs: BP 127/63 mmHg  Pulse 78  Temp(Src) 98 F (36.7 C) (Oral)  Resp 20  Ht 5\' 3"  (1.6 m)  Wt 171 lb (77.565 kg)  BMI 30.30 kg/m2  SpO2 98%  LMP 01/19/1992  Physical Exam General: A&Ox3, NAD Abd: Soft, NT, Right PCN intact with 200 cc clear yellow urine output in bag, 1325 cc /24 hrs  Imaging: No results found.  Labs:  CBC:  Recent Labs  05/23/15 0602 05/24/15 0522 05/25/15 0515 05/26/15 0545  WBC 27.9* 28.6* 8.9 5.9  HGB 10.9* 10.1* 9.4* 9.6*  HCT 34.8* 33.1* 29.1* 30.1*  PLT 330 303 199 189    COAGS:  Recent Labs  11/23/14 0305 02/26/15 0015 05/04/15 0435 05/23/15 1450  INR 1.31 1.17 1.24 1.30  APTT  --  33  --   --     BMP:  Recent Labs  05/24/15 0522 05/25/15 0515 05/26/15 0545 05/27/15 0530  NA 131* 133* 135 136  K 5.4* 3.4* 3.3* 3.9  CL 108 99* 99* 102  CO2 10* 23 25 23   GLUCOSE 107* 126* 110* 99  BUN 94* 84* 73* 62*  CALCIUM 8.1* 7.5* 8.1* 8.0*  CREATININE 6.73* 6.05* 5.49* 5.23*  GFRNONAA 6* 6* 7* 8*  GFRAA 6* 7* 8* 9*    LIVER FUNCTION TESTS:  Recent Labs  05/22/15 1857 05/23/15 0602 05/24/15 0522 05/25/15 0515  BILITOT 0.7 0.5 0.4 0.7  AST 21 22 23 27   ALT 19 21  21 24  ALKPHOS 107 89 75 61  PROT 7.5 6.2* 5.5* 4.7*  ALBUMIN 3.4* 2.7* 2.4* 1.8*    Assessment and Plan: Solitary right kidney with chronic obstruction s/p ureteral stenting- persistent hydro s/p new right PCN placed 04/11/15 S/p right PCN removal 05/06/15 Pyelonephritis and urosepsis 05/23/15 S/p new right PCN placed 05/23/15, good output clearing up, Cr improving, wbc improved, no new labs since 12/7 Will place order for outpatient 4-6 week PCN exchange and sign off, patient wishes to keep PCN in place for now Will leave removal of ureteral stent up to Urology  Plans per primary team, Urology and Nephrology   Signed: Hedy Jacob 05/29/2015, 11:13  AM   I spent a total of 15 Minutes at the the patient's bedside AND on the patient's hospital floor or unit, greater than 50% of which was counseling/coordinating care for right hydronephrosis

## 2015-05-29 NOTE — Progress Notes (Signed)
Pt c/o pain at IV site, RN assessed and site was found to be edematous and red. IVF stopped. IV removed. Ice applied. Will continue to monitor.

## 2015-05-29 NOTE — Progress Notes (Addendum)
Patient ID: [name], female   DOB: 06-05-1946, 69 y.o.   MRN: [id number] [facility] PROGRESS NOTE  [name] [id number] DOB: Jul 02, 1945 DOA: 05/22/2015 PCP: [name], MD  Brief narrative:    69 year old female with past medical history of recurrent nephrolithiasis with intermittent bilateral hydronephrosis requiring ureteral stent and right-sided nephrostomy, CKD stage V (has HD catheter,scheduled for AV fistula placement next week), recently hospitalized for sepsis secondary to pyelonephritis at which time she underwent removal of right percutaneous nephrostomy. She then had double J stent placement with stent exchange and was subsequently discharged home on antibiotics.   Patient returned to the ED 05/22/2015 with fevers,  chills and temperature of 103F. She also had dysuria and flank pain.Her Cr was elevated at 7.1. She was admitted for sepsis management. Renal ultrasound done showing moderate right hydronephrosis with ureteral stent within the renal pelvis and bladder concerning for ureteral stent failure. Patient has been seen by urology and IR for urgent right nephrostomy tube placement which was done 05/23/2015.  Assessment/Plan:    Principal Problem: Severe sepsis due to staph species  - In the setting of UTI / pyelonephritis with persistent hydronephrosis - Urine culture growing staph species - Blood cultures negative so far - Continue vancomycin. - Appreciate infectious disease assistance.  Active Problems: Persistent hydronephrosis with recurrent urosepsis - Renal ultrasound demonstrated persistent hydronephrosis despite ureteral stent with good positioning.  - Pt is s/p emergent right nephrostomy tube placement by IR given ureteral stent failure. Recommended to remove the ureteral stent once sepsis resolves. PCN to be exchanges on outpt basis but ureteral stent removal per GU.  Acute on chronic kidney disease stage V with metabolic acidosis -  Due to dehydration and sepsis with associated hydronephrosis.  - Has HDcatheter placed during prior hospitalization - Check BMP today   Diarrhea - Secondary to abx therapy - Continue florastor - Stool for C. difficile positive but negative for C. difficile antigen toxin indicating colonization   Anemia of chronic renal failure, stage 5 - Hemoglobin is 9.6, stable    Chronic diastolic CHF - 2 D ECHO in 11/2014 with LVH, normal EF - Compensated   Hypokalemia / hypomagnesemia - Secondary to GI losses. Supplemented  GERD - Continue Pepcid   Protein calorie malnutrition, severe - IN the context of acute illness - Seen by dietitian  DVT prophylaxis - SCD's bilaterally while patient in hospital  Code Status: Full.  Family Communication:  plan of care discussed with the patient and her daughter at the bedside  Disposition Plan: Likely by 05/31/2015    IV access:  Peripheral IV  Procedures and diagnostic studies:    Renal ultrasound Right nephrostomy by IR on 12/3  Medical Consultants:  Urology Nephrology IR  Other Consultants:  PT Nutrition  IAnti-Infectives:   Rocephin 1 Zosyn 12/3-12/6 Vancomycin 12/3 -->    Leisa Lenz, MD  [facility] Pager [phone]  Time spent in minutes: 15 minutes  If 7PM-7AM, please contact night-coverage [email or web address] Password [email or web address] 05/29/2015, 12:02 PM   LOS: 7 days    HPI/Subjective: No acute overnight events. Patient reports no nausea or vomiting.  Objective: Filed Vitals:   05/28/15 0611 05/28/15 1551 05/28/15 2141 05/29/15 0608  BP: 126/86 113/65 118/78 127/63  Pulse: 96 91 87 78  Temp: 98 F (36.7 C) 97.4 F (36.3 C) 98.5 F (36.9 C) 98 F (36.7 C)  TempSrc: Oral Oral Oral Oral  Resp: 20 18 20 20   Height:  Weight:      SpO2: 96% 99% 100% 98%    Intake/Output Summary (Last 24 hours) at 05/29/15 1202 Last data filed at 05/29/15 1100  Gross per 24 hour  Intake    240 ml  Output   2075 ml   Net  -1835 ml    Exam:   General:  Pt is alert, sitting in chair, no distress  Cardiovascular: Rate controlled, appreciate S1-S2  Respiratory: Bilateral air entry, no wheezing  Abdomen: right nephrostomy (+), bowel sounds appreciated, nontender abdomen  Extremities: No leg swelling, pulses palpable  Neuro: No focal deficits  Data Reviewed: Basic Metabolic Panel:  Recent Labs Lab 05/23/15 0602 05/24/15 0522 05/25/15 0515 05/25/15 1450 05/26/15 0545 05/27/15 0530  NA 132* 131* 133*  --  135 136  K 5.4* 5.4* 3.4*  --  3.3* 3.9  CL 106 108 99*  --  99* 102  CO2 15* 10* 23  --  25 23  GLUCOSE 143* 107* 126*  --  110* 99  BUN 99* 94* 84*  --  73* 62*  CREATININE 7.11* 6.73* 6.05*  --  5.49* 5.23*  CALCIUM 8.7* 8.1* 7.5*  --  8.1* 8.0*  MG 1.9  --   --  1.6*  --   --    Liver Function Tests:  Recent Labs Lab 05/22/15 1857 05/23/15 0602 05/24/15 0522 05/25/15 0515  AST 21 22 23 27   ALT 19 21 21 24   ALKPHOS 107 89 75 61  BILITOT 0.7 0.5 0.4 0.7  PROT 7.5 6.2* 5.5* 4.7*  ALBUMIN 3.4* 2.7* 2.4* 1.8*   No results for input(s): LIPASE, AMYLASE in the last 168 hours. No results for input(s): AMMONIA in the last 168 hours. CBC:  Recent Labs Lab 05/22/15 1857 05/23/15 0602 05/24/15 0522 05/25/15 0515 05/26/15 0545  WBC 22.2* 27.9* 28.6* 8.9 5.9  NEUTROABS 21.1* 26.8* 28.0* 8.3*  --   HGB 12.9 10.9* 10.1* 9.4* 9.6*  HCT 41.4 34.8* 33.1* 29.1* 30.1*  MCV 90.2 89.5 91.2 86.6 86.5  PLT 367 330 303 199 189   Cardiac Enzymes: No results for input(s): CKTOTAL, CKMB, CKMBINDEX, TROPONINI in the last 168 hours. BNP: Invalid input(s): POCBNP CBG: No results for input(s): GLUCAP in the last 168 hours.  Culture, blood (routine x 2)     Status: None   Collection Time: 05/22/15  6:58 PM  Result Value Ref Range Status   Specimen Description BLOOD RIGHT ANTECUBITAL  Final   Special Requests IN PEDIATRIC BOTTLE 4 ML  Final   Culture   Final    NO GROWTH 5  DAYS Performed at [facility]    Report Status 05/27/2015 FINAL  Final  Culture, blood (routine x 2)     Status: None   Collection Time: 05/22/15  7:02 PM  Result Value Ref Range Status   Specimen Description BLOOD RIGHT HAND  Final   Special Requests IN PEDIATRIC BOTTLE 3 CC  Final   Culture   Final    NO GROWTH 5 DAYS Performed at [facility]    Report Status 05/27/2015 FINAL  Final  Urine culture     Status: None   Collection Time: 05/22/15  7:56 PM  Result Value Ref Range Status   Specimen Description URINE, CLEAN CATCH  Final   Special Requests NONE  Final   Culture   Final   Report Status 05/25/2015 FINAL  Final   Organism ID, Bacteria STAPHYLOCOCCUS AUREUS  Final  Susceptibility   Staphylococcus aureus - MIC*    CIPROFLOXACIN >=8 RESISTANT Resistant     GENTAMICIN <=0.5 SENSITIVE Sensitive     NITROFURANTOIN <=16 SENSITIVE Sensitive     OXACILLIN >=4 RESISTANT Resistant     TETRACYCLINE <=1 SENSITIVE Sensitive     VANCOMYCIN 1 SENSITIVE Sensitive     TRIMETH/SULFA <=10 SENSITIVE Sensitive     CLINDAMYCIN >=8 RESISTANT Resistant     RIFAMPIN <=0.5 SENSITIVE Sensitive     Inducible Clindamycin NEGATIVE Sensitive     * >=100,000 COLONIES/mL STAPHYLOCOCCUS AUREUS  Urine culture     Status: None   Collection Time: 05/23/15  2:01 PM  Result Value Ref Range Status   Specimen Description KIDNEY RIGHT  Final   Special Requests NONE  Final   Culture   Final    >=100,000 COLONIES/mL YEAST Performed at [facility]    Report Status 05/25/2015 FINAL  Final  Culture, blood (routine x 2)     Status: None   Collection Time: 05/23/15  9:26 PM  Result Value Ref Range Status   Specimen Description BLOOD RIGHT WRIST  Final   Special Requests   Final   Culture   Final    NO GROWTH 5 DAYS Performed at [facility]    Report Status 05/28/2015 FINAL  Final  Culture, blood (routine x 2)     Status: None   Collection Time: 05/23/15  9:30 PM   Result Value Ref Range Status   Specimen Description BLOOD RIGHT HAND  Final   Culture   Final    NO GROWTH 5 DAYS Performed at [facility]    Report Status 05/28/2015 FINAL  Final  C difficile quick scan w PCR reflex     Status: Abnormal   Collection Time: 05/24/15  7:27 PM  Result Value Ref Range Status   C Diff antigen POSITIVE (A) NEGATIVE Final   C Diff toxin NEGATIVE NEGATIVE Final   C Diff interpretation   Final    C. difficile present, but toxin not detected. This indicates colonization. In most cases, this does not require treatment. If patient has signs and symptoms consistent with colitis, consider treatment. Requires ENTERIC precautions.     Scheduled Meds: . famotidine  20 mg Oral Daily  . feeding supplement (NEPRO CARB STEADY)  237 mL Oral q morning - 10a  . latanoprost  1 drop Both Eyes QHS  . saccharomyces boulardii  250 mg Oral BID  . sodium bicarbonate  650 mg Oral BID  . sodium chloride  3 mL Intravenous Q12H  . timolol  1 drop Both Eyes q morning - 10a  . vancomycin  750 mg Intravenous Q48H   Continuous Infusions:

## 2015-05-29 NOTE — Progress Notes (Signed)
Pharmacy Antibiotic Time-Out Note  [name] is a 69 y.o. year-old female admitted on 05/22/2015.  The patient is currently on day 6 of Vancomycin for MRSA UTI .  Assessment/Plan: ESRD -SCr improved since admission, no new labs since 12/7.  Note plan for vancomycin to continue while inpatient with transition to doxycycline at discharge through 06/05/2015 per ID recommendations.    Continue vancomycin 750mg  IV q48h.  Check SCr in AM to assess vancomycin dose for 12/11.    Temp (24hrs), Avg:98 F (36.7 C), Min:97.4 F (36.3 C), Max:98.5 F (36.9 C)   Recent Labs Lab 05/22/15 1857 05/23/15 0602 05/24/15 0522 05/25/15 0515 05/26/15 0545  WBC 22.2* 27.9* 28.6* 8.9 5.9    Recent Labs Lab 05/23/15 0602 05/24/15 0522 05/25/15 0515 05/26/15 0545 05/27/15 0530  CREATININE 7.11* 6.73* 6.05* 5.49* 5.23*   Estimated Creatinine Clearance: 9.9 mL/min (by C-G formula based on Cr of 5.23).    Allergies  Allergen Reactions  . Vicodin [Hydrocodone-Acetaminophen] Nausea And Vomiting  . Percocet [Oxycodone-Acetaminophen] Nausea And Vomiting   Labs/Vitals:  Temp: afebrile since 12/5 WBC: improved to wnl CKD stage 4, scr 5.23 slowly improving (crcl<10) -  LA 0.90 on 12/02 - 12/2 CXR: no active dz  Microbiology:  12/2 CTX>> 12/3 12/3 zosyn>> 12/6 12/3 vancomycin >>  Cultures:  12/3 bcx x2: ngf 12/3 ucx: yeast 12/2 bcx x2: ngtd 12/02 ucx:  MRSA ( S- gent, inducible clinda, nitrofurantoin, rifampin, tetracycline, Septra, vanc) 12/02 UA: amber color, turbid, >300 protein, positive nitrite and leukocytes 12/4: c diff antigen positive, toxin negative  Levels/Dose Changes: 12/05 at 0500 VR: 18  (s/p 1500 mg on 12/3 at 2121) 12/7 VR 14 - start vanc 750mg  q48  Thank you for allowing pharmacy to be a part of this patient's care.  Ralene Bathe, PharmD, BCPS 05/29/2015, 12:56 PM  Pager: [phone]

## 2015-05-30 DIAGNOSIS — N390 Urinary tract infection, site not specified: Secondary | ICD-10-CM

## 2015-05-30 LAB — BASIC METABOLIC PANEL
Anion gap: 11 (ref 5–15)
BUN: 57 mg/dL — ABNORMAL HIGH (ref 6–20)
CO2: 26 mmol/L (ref 22–32)
Calcium: 8.4 mg/dL — ABNORMAL LOW (ref 8.9–10.3)
Chloride: 102 mmol/L (ref 101–111)
Creatinine, Ser: 4.86 mg/dL — ABNORMAL HIGH (ref 0.44–1.00)
GFR calc Af Amer: 10 mL/min — ABNORMAL LOW (ref >60)
GFR calc non Af Amer: 8 mL/min — ABNORMAL LOW (ref >60)
Glucose, Bld: 89 mg/dL (ref 65–99)
Potassium: 3.7 mmol/L (ref 3.5–5.1)
Sodium: 139 mmol/L (ref 135–145)

## 2015-05-30 MED ORDER — DOXYCYCLINE HYCLATE 100 MG PO TABS
100.0000 mg | ORAL_TABLET | Freq: Two times a day (BID) | ORAL | Status: DC
  Administered 2015-05-30 – 2015-05-31 (×3): 100 mg via ORAL
  Filled 2015-05-30 (×4): qty 1

## 2015-05-30 MED ORDER — DEXTROMETHORPHAN POLISTIREX ER 30 MG/5ML PO SUER: 15.0000 mg | Freq: Three times a day (TID) | ORAL | Status: DC | PRN

## 2015-05-30 MED ORDER — SODIUM BICARBONATE 650 MG PO TABS: 650.0000 mg | ORAL_TABLET | Freq: Two times a day (BID) | ORAL | Status: DC

## 2015-05-30 MED ORDER — HYDROCODONE-ACETAMINOPHEN 5-325 MG PO TABS: 1.0000 | ORAL_TABLET | Freq: Four times a day (QID) | ORAL | Status: DC | PRN

## 2015-05-30 MED ORDER — DOXYCYCLINE HYCLATE 100 MG PO TABS: 100.0000 mg | ORAL_TABLET | Freq: Two times a day (BID) | ORAL | Status: AC

## 2015-05-30 MED ORDER — SACCHAROMYCES BOULARDII 250 MG PO CAPS
250.0000 mg | ORAL_CAPSULE | Freq: Two times a day (BID) | ORAL | Status: DC
Start: 2015-05-30 — End: 2015-07-17

## 2015-05-30 MED ORDER — ONDANSETRON HCL 4 MG PO TABS: 4.0000 mg | ORAL_TABLET | Freq: Four times a day (QID) | ORAL | Status: DC | PRN

## 2015-05-30 MED ORDER — DIPHENOXYLATE-ATROPINE 2.5-0.025 MG/5ML PO LIQD: 10.0000 mL | Freq: Four times a day (QID) | ORAL | Status: DC | PRN

## 2015-05-30 NOTE — Discharge Summary (Addendum)
Physician Discharge Summary  [name] [id number] DOB: Dec 16, 1945 DOA: 05/22/2015  PCP: [name], MD  Admit date: 05/22/2015 Discharge date: 05/30/2015  Recommendations for Outpatient Follow-up:  1. Continue doxycycline through 06/05/2015 2. F/U with PCP and GU per scheduled appt   Discharge Diagnoses:  Principal Problem:   Acute pyelonephritis Active Problems:   Glaucoma   Essential hypertension, benign   CAD (coronary artery disease)   Acute-on-chronic kidney injury ([place])   End stage renal disease (HCC)   Urinary tract infectious disease   Hyperkalemia   Hydronephrosis with urinary obstruction due to renal calculus   Metabolic acidosis   Hydronephrosis, right   Severe protein-calorie malnutrition (HCC)   Severe sepsis (HCC)    Discharge Condition: stable   Diet recommendation: as tolerated   History of present illness:  69 year old female with past medical history of recurrent nephrolithiasis with intermittent bilateral hydronephrosis requiring ureteral stent and right-sided nephrostomy, CKD stage V (has HD catheter,scheduled for AV fistula placement next week), recently hospitalized for sepsis secondary to pyelonephritis at which time she underwent removal of right percutaneous nephrostomy. She then had double J stent placement with stent exchange and was subsequently discharged home on antibiotics.   Patient returned to the ED 05/22/2015 with fevers, chills and temperature of 103F. She also had dysuria and flank pain.Her Cr was elevated at 7.1. She was admitted for sepsis management. Renal ultrasound done showing moderate right hydronephrosis with ureteral stent within the renal pelvis and bladder concerning for ureteral stent failure. Patient has been seen by urology and IR for urgent right nephrostomy tube placement which was done 05/23/2015.  Hospital Course:    Assessment/Plan:    Principal Problem: Severe sepsis due to staph species  - In  the setting of UTI / pyelonephritis with persistent hydronephrosis - Urine culture growing staph species - Blood cultures negative  - Currently on vanco, changed to doxycyline today 12/10 and she will continue this through 12/16 per ID recommendations   Active Problems: Persistent hydronephrosis with recurrent urosepsis - Renal ultrasound demonstrated persistent hydronephrosis despite ureteral stent with good positioning.  - Pt is s/p emergent right nephrostomy tube placement by IR given ureteral stent failure. Recommended to remove the ureteral stent once sepsis resolves. PCN to be exchanges on outpt basis but ureteral stent removal per GU.  Acute on chronic kidney disease stage V with metabolic acidosis - Due to dehydration and sepsis with associated hydronephrosis.  - Has HDcatheter placed during prior hospitalization - Cr has improved since admission to 4.86 this am   Diarrhea - Secondary to abx therapy - Continue florastor - Stool for C. difficile positive but negative for C. difficile antigen toxin indicating colonization   Anemia of chronic renal failure, stage 5 - Hemoglobin is stable  - No current indication for transfusion   Chronic diastolic CHF - 2 D ECHO in 11/2014 with LVH, normal EF - Compensated   Hypokalemia / hypomagnesemia - Secondary to GI losses. Supplemented  GERD - Continue Pepcid   Protein calorie malnutrition, severe - In the context of acute illness - Seen by dietician - Diet as tolerated   DVT prophylaxis - SCD's bilaterally   Code Status: Full.  Family Communication: plan of care discussed with the patient and her daughter at the bedside    IV access:  Peripheral IV  Procedures and diagnostic studies:   Renal ultrasound Right nephrostomy by IR on 12/3  Medical Consultants:  Urology Nephrology IR  Other Consultants:  PT Nutrition  IAnti-Infectives:   Rocephin 1 Zosyn 12/3-12/6 Vancomycin 12/3 -->  05/30/2015 Doxycyline 12/10 --_ 06/05/2015   Signed:  Leisa Lenz, MD  [facility] 05/30/2015, 12:17 PM  Pager #: [phone]  Time spent in minutes: more than 30 minutes  Discharge Exam: Filed Vitals:   05/29/15 2136 05/30/15 0525  BP: 128/64 139/64  Pulse: 77 80  Temp: 98.3 F (36.8 C) 97.6 F (36.4 C)  Resp: 20 20   Filed Vitals:   05/29/15 0608 05/29/15 1418 05/29/15 2136 05/30/15 0525  BP: 127/63 133/73 128/64 139/64  Pulse: 78 75 77 80  Temp: 98 F (36.7 C) 97.9 F (36.6 C) 98.3 F (36.8 C) 97.6 F (36.4 C)  TempSrc: Oral Oral Oral Oral  Resp: 20 20 20 20   Height:      Weight:      SpO2: 98% 98% 97% 98%    General: Pt is alert, follows commands appropriately, not in acute distress Cardiovascular: Regular rate and rhythm, S1/S2 +, no murmurs Respiratory: Clear to auscultation bilaterally, no wheezing, no crackles, no rhonchi Abdominal: Soft, non tender, non distended, bowel sounds +, no guarding, nephrostomy in palce Extremities: no edema, no cyanosis, pulses palpable bilaterally DP and PT Neuro: Grossly nonfocal  Discharge Instructions  Discharge Instructions    Call MD for:  difficulty breathing, headache or visual disturbances    Complete by:  As directed      Call MD for:  extreme fatigue    Complete by:  As directed      Call MD for:  persistant dizziness or light-headedness    Complete by:  As directed      Call MD for:  persistant nausea and vomiting    Complete by:  As directed      Call MD for:  severe uncontrolled pain    Complete by:  As directed      Diet - low sodium heart healthy    Complete by:  As directed      Discharge instructions    Complete by:  As directed   Continue doxycyline through 06/05/2015. Follow up with interventional radiology for PCN exchange F/U with GU per scheduled appt.     Increase activity slowly    Complete by:  As directed             Medication List    STOP taking these medications         amoxicillin-clavulanate 250-125 MG tablet  Commonly known as:  AUGMENTIN      TAKE these medications        acetaminophen 500 MG tablet  Commonly known as:  TYLENOL  Take 500 mg by mouth every 6 (six) hours as needed for moderate pain.     acidophilus Caps capsule  Take 2 capsules by mouth daily.     bimatoprost 0.01 % Soln  Commonly known as:  LUMIGAN  Place 1 drop into both eyes at bedtime.     dextromethorphan 30 MG/5ML liquid  Commonly known as:  DELSYM  Take 2.5 mLs (15 mg total) by mouth every 8 (eight) hours as needed for cough.     diphenoxylate-atropine 2.5-0.025 MG/5ML liquid  Commonly known as:  LOMOTIL  Take 10 mLs by mouth 4 (four) times daily as needed for diarrhea or loose stools.     doxycycline 100 MG tablet  Commonly known as:  VIBRA-TABS  Take 1 tablet (100 mg total) by mouth every 12 (twelve) hours.     ENSURE PLUS Liqd  Take 237 mLs by mouth daily as needed (nutritional supplement).     feeding supplement (NEPRO CARB STEADY) Liqd  Take 237 mLs by mouth daily.     HYDROcodone-acetaminophen 5-325 MG tablet  Commonly known as:  NORCO/VICODIN  Take 1 tablet by mouth 4 (four) times daily as needed for moderate pain.     ondansetron 4 MG tablet  Commonly known as:  ZOFRAN  Take 1 tablet (4 mg total) by mouth every 6 (six) hours as needed for nausea.     ranitidine 150 MG tablet  Commonly known as:  ZANTAC  Take 150 mg by mouth 2 (two) times daily as needed for heartburn.     saccharomyces boulardii 250 MG capsule  Commonly known as:  FLORASTOR  Take 1 capsule (250 mg total) by mouth 2 (two) times daily.     sodium bicarbonate 650 MG tablet  Take 1 tablet (650 mg total) by mouth 2 (two) times daily.     sodium chloride 0.65 % Soln nasal spray  Commonly known as:  OCEAN  Place 1 spray into both nostrils 3 (three) times daily as needed for congestion.     timolol 0.5 % ophthalmic solution  Commonly known as:  BETIMOL  Place 1 drop into both  eyes every morning.           Follow-up Information    Follow up with [name], MD. Schedule an appointment as soon as possible for a visit in 1 week.   Specialty:  Internal Medicine   Why:  Follow up appt after recent hospitalization   Contact information:   [address] [place] [address] [phone]        The results of significant diagnostics from this hospitalization (including imaging, microbiology, ancillary and laboratory) are listed below for reference.    Significant Diagnostic Studies: Ct Abdomen Pelvis Wo Contrast  05/02/2015  CLINICAL DATA:  Shaking chills and slurring words today. Right nephrostomy tube in place. EXAM: CT ABDOMEN AND PELVIS WITHOUT CONTRAST TECHNIQUE: Multidetector CT imaging of the abdomen and pelvis was performed following the standard protocol without IV contrast. COMPARISON:  CT abdomen and pelvis 02/20/2015 and 04/06/2015. FINDINGS: Hiatal hernia is identified. No pleural or pericardial effusion. There is some atelectasis or scar in the left lung base. Right nephrostomy tube and right ureteral stent are in place. The tubes are well-positioned. There is no right hydronephrosis. A tiny amount of air is seen in the right intrarenal collecting system likely related to the nephrostomy tube. Atrophy of the left kidney is again seen and there is mild fullness of the left intrarenal collecting system. There is some stranding about both kidneys. The walls left ureter appear somewhat thickened. Urinary bladder is decompressed scratch the urinary bladder is nearly completely decompressed. The gallbladder is been removed. The liver, spleen, adrenal glands and pancreas are unremarkable. The patient is status post hysterectomy. Fat containing ventral hernias are again seen and unchanged. The largest hernias in the pelvis. Diverticulosis appears worst in the sigmoid but there is no evidence of diverticulitis. The small bowel and appendix  are unremarkable. No fluid collection or lymphadenopathy is seen. Lumbar spondylosis appears worst at L2-3. No lytic or sclerotic bony lesion is identified. IMPRESSION: A nephrostomy tube in the right kidney is new since the comparison examinations. The right intrarenal collecting system is decompressed. Tiny amount of gas in an upper pole calyx is likely related to the nephrostomy tube. Right double-J ureteral stent remains in  place in good position. Mild stranding about both kidneys. Very mild left hydronephrosis and of the walls of the left ureter is unchanged and may be due to infection or inflammation. Findings raise the possibility of pyelonephritis and/or ureteritis. Diverticulosis without diverticulitis. Hiatal hernia and fat containing ventral hernias, largest in the pelvis, are unchanged. Electronically Signed   By: Inge Rise M.D.   On: 05/02/2015 20:12   Dg Chest 2 View  05/22/2015  CLINICAL DATA:  Sepsis. Shortness of breath. Chronic kidney disease. EXAM: CHEST  2 VIEW COMPARISON:  05/02/1967 chest radiograph. FINDINGS: Right internal jugular central venous catheter terminates in the lower third of the superior vena cava. Stable cardiomediastinal silhouette with normal heart size and retrocardiac lucency in keeping with known hiatal hernia. No pneumothorax. No pleural effusion. Slightly low lung volumes. No overt pulmonary edema. No focal lung opacity. Partially visualized is a left nephro ureteral stent. IMPRESSION: No active cardiopulmonary disease. Electronically Signed   By: Ilona Sorrel M.D.   On: 05/22/2015 19:52   Dg Chest 2 View  05/02/2015  CLINICAL DATA:  Acute onset of fever and shortness of breath. Initial encounter. EXAM: CHEST  2 VIEW COMPARISON:  Chest radiograph performed 02/25/2015 FINDINGS: The lungs are well-aerated and clear. There is no evidence of focal opacification, pleural effusion or pneumothorax. The heart is normal in size; the mediastinal contour is within  normal limits. No acute osseous abnormalities are seen. A large hiatal hernia is noted, partially filled with air. A right-sided dual-lumen catheter is noted ending about the mid SVC. IMPRESSION: 1. No acute cardiopulmonary process are seen. 2. Large hiatal hernia noted. Electronically Signed   By: Garald Balding M.D.   On: 05/02/2015 18:20   US Renal  05/23/2015  CLINICAL DATA:  Pyelonephritis. Status post prior right percutaneous nephrostomy tube placement on 04/11/2015 for hydronephrosis. This nephrostomy tube was removed on 05/06/2015 and the patient has an indwelling right ureteral stent. EXAM: RENAL / URINARY TRACT ULTRASOUND COMPLETE COMPARISON:  CT of the abdomen on 05/02/2015 and prior renal ultrasound on 04/11/2015. FINDINGS: Right Kidney: Length: 11.6 cm. Moderate right hydronephrosis present. Portion of the proximal and of a ureteral stent is visible in the renal pelvis. Re- development of hydronephrosis may implicate relative stent malfunction or occlusion. No focal renal lesions identified on the right. No focal perinephric fluid collections seen by ultrasound. Left Kidney: Length: 9.4 cm. The left kidney demonstrates mild fullness of the collecting system without significant hydronephrosis. No focal renal lesion. Bladder: The bladder contains dependent debris. Distal portion of the right ureteral stent is visible in the bladder lumen. IMPRESSION: Moderate right hydronephrosis with indwelling right ureteral stent visualized. Re-development of hydronephrosis may implicate relative stent malfunction/occlusion. Electronically Signed   By: Aletta Edouard M.D.   On: 05/23/2015 10:00   Ir Nephro Tube Remov/fl  05/06/2015  INDICATION: 69 year old female with a history of hydronephrosis. Percutaneous nephrostomy placed on the right 04/11/2015. The patient has since undergone ureteral stenting, and the percutaneous nephrostomy may be removed at this time. This will be performed under fluoroscopy to  assure that the stents does not get displaced. EXAM: REMOVAL OF PERCUTANEOUS NEPHROSTOMY MEDICATIONS: None ANESTHESIA/SEDATION: None CONTRAST:  None COMPLICATIONS: None PROCEDURE: Informed consent was obtained from the patient following an explanation of the procedure, risks, benefits and alternatives. The patient understands, agrees and consents for the procedure. All questions were addressed. A time out was performed prior to the initiation of the procedure. The patient was positioned prone and non-contrast  localization CT was performed of the pelvis to demonstrate the iliac marrow spaces. The operative site was prepped and draped in the usual sterile fashion. Under fluoroscopic observation, the right-sided percutaneous nephrostomy tube was removed after ligation of the tube. The indwelling right-sided ureteral stent is unchanged in position. Patient tolerated the procedure well and remained hemodynamically stable throughout. No complications encountered. IMPRESSION: Status post fluoroscopic observation of right percutaneous nephrostomy removal. Unchanged position of right ureteral stent. Signed, Dulcy Fanny. Earleen Newport, DO [facility] Electronically Signed   By: Corrie Mckusick D.O.   On: 05/06/2015 12:30   Dg Chest Port 1 View  05/23/2015  CLINICAL DATA:  40-year-old female with fever. Patient denies chest complaint. EXAM: PORTABLE CHEST 1 VIEW COMPARISON:  Chest radiograph dated 05/22/2015 FINDINGS: A dialysis catheter is noted in stable positioning. There is an area of increased opacity in the left lung base may represent atelectatic changes. Pneumonia is not excluded. Clinical correlation recommended. Frontal and lateral views of the chest may provide better evaluation. There is no pleural effusion or pneumothorax. Stable cardiac silhouette. The osseous structures are grossly unremarkable. IMPRESSION: Left lung base subsegmental atelectasis. Developing  pneumonia is not excluded. Clinical correlation is recommended. Electronically Signed   By: Anner Crete M.D.   On: 05/23/2015 23:24   Ir Nephrostomy Placement Right  05/23/2015  CLINICAL DATA:  Right-sided hydronephrosis with clinical evidence of pyelonephritis and urosepsis. Indwelling ureteral stent is present which is presumably obstructed. The patient requires emergent right-sided percutaneous nephrostomy to decompress the right kidney. History of prior right nephrostomy tube placement for similar reasons on 04/11/2015. This nephrostomy tube was removed on 05/06/2015. EXAM: 1. ULTRASOUND GUIDANCE FOR PUNCTURE OF THE RIGHT RENAL COLLECTING SYSTEM. 2. RIGHT PERCUTANEOUS NEPHROSTOMY TUBE PLACEMENT. COMPARISON:  Renal ultrasound today prior nephrostomy tube placement imaging on 04/11/2015. ANESTHESIA/SEDATION: 2.0 mg IV Versed; 100 mcg IV Fentanyl. Total Moderate Sedation Time 12 minutes. CONTRAST:  5.0 ml Omnipaque 300 MEDICATIONS: 3.375 g IV Zosyn. Antibiotic was administered in an appropriate time frame prior to skin puncture. FLUOROSCOPY TIME:  36 seconds. PROCEDURE: The procedure, risks, benefits, and alternatives were explained to the patient. Questions regarding the procedure were encouraged and answered. The patient understands and consents to the procedure. A time-out was performed prior to initiating the procedure. The right flank region was prepped with Betadine in a sterile fashion, and a sterile drape was applied covering the operative field. A sterile gown and sterile gloves were used for the procedure. Local anesthesia was provided with 1% Lidocaine. Ultrasound was used to localize the right kidney. Under direct ultrasound guidance, a 21 gauge needle was advanced into the renal collecting system. Ultrasound image documentation was performed. Aspiration of urine sample was performed followed by contrast injection. Aspirated urine sample was sent for culture analysis. A transitional dilator was  advanced over a guidewire. Percutaneous tract dilatation was then performed over the guidewire. A 10 -French percutaneous nephrostomy tube was then advanced and formed in the collecting system. Catheter position was confirmed by fluoroscopy after contrast injection. The catheter was secured at the skin with a Prolene retention suture and Stat-Lock device. A gravity bag was placed. COMPLICATIONS: None. FINDINGS: Ultrasound demonstrates significant hydronephrosis. There was immediate return of grossly purulent fluid from the renal collecting system consistent with pyonephrosis. A 10 French nephrostomy tube was formed at the level of the renal pelvis. This will be left to gravity drainage. IMPRESSION: Placement of 10 French right percutaneous nephrostomy tube. Fluid return from  the collecting system was grossly purulent consistent with pyonephrosis. A aspirated urine sample was sent for culture analysis. Electronically Signed   By: Aletta Edouard M.D.   On: 05/23/2015 14:48    Microbiology: Recent Results (from the past 240 hour(s))  Culture, blood (routine x 2)     Status: None   Collection Time: 05/22/15  6:58 PM  Result Value Ref Range Status   Specimen Description BLOOD RIGHT ANTECUBITAL  Final   Special Requests IN PEDIATRIC BOTTLE 4 ML  Final   Culture   Final    NO GROWTH 5 DAYS Performed at [facility]    Report Status 05/27/2015 FINAL  Final  Culture, blood (routine x 2)     Status: None   Collection Time: 05/22/15  7:02 PM  Result Value Ref Range Status   Specimen Description BLOOD RIGHT HAND  Final   Special Requests IN PEDIATRIC BOTTLE 3 CC  Final   Culture   Final    NO GROWTH 5 DAYS Performed at [facility]    Report Status 05/27/2015 FINAL  Final  Urine culture     Status: None   Collection Time: 05/22/15  7:56 PM  Result Value Ref Range Status   Specimen Description URINE, CLEAN CATCH  Final   Special Requests NONE  Final   Culture   Final    >=100,000  COLONIES/mL STAPHYLOCOCCUS AUREUS Performed at [facility]    Report Status 05/25/2015 FINAL  Final   Organism ID, Bacteria STAPHYLOCOCCUS AUREUS  Final      Susceptibility   Staphylococcus aureus - MIC*    CIPROFLOXACIN >=8 RESISTANT Resistant     GENTAMICIN <=0.5 SENSITIVE Sensitive     NITROFURANTOIN <=16 SENSITIVE Sensitive     OXACILLIN >=4 RESISTANT Resistant     TETRACYCLINE <=1 SENSITIVE Sensitive     VANCOMYCIN 1 SENSITIVE Sensitive     TRIMETH/SULFA <=10 SENSITIVE Sensitive     CLINDAMYCIN >=8 RESISTANT Resistant     RIFAMPIN <=0.5 SENSITIVE Sensitive     Inducible Clindamycin NEGATIVE Sensitive     * >=100,000 COLONIES/mL STAPHYLOCOCCUS AUREUS  Urine culture     Status: None   Collection Time: 05/23/15  2:01 PM  Result Value Ref Range Status   Specimen Description KIDNEY RIGHT  Final   Special Requests NONE  Final   Culture   Final    >=100,000 COLONIES/mL YEAST Performed at [facility]    Report Status 05/25/2015 FINAL  Final  Culture, blood (routine x 2)     Status: None   Collection Time: 05/23/15  9:26 PM  Result Value Ref Range Status   Specimen Description BLOOD RIGHT WRIST  Final   Special Requests   Final    BOTTLES DRAWN AEROBIC AND ANAEROBIC 10 CC BOTH BOTTLES   Culture   Final    NO GROWTH 5 DAYS Performed at [facility]    Report Status 05/28/2015 FINAL  Final  Culture, blood (routine x 2)     Status: None   Collection Time: 05/23/15  9:30 PM  Result Value Ref Range Status   Specimen Description BLOOD RIGHT HAND  Final   Special Requests BOTTLES DRAWN AEROBIC ONLY 10 CC  Final   Culture   Final    NO GROWTH 5 DAYS Performed at [facility]    Report Status 05/28/2015 FINAL  Final  C difficile quick scan w PCR reflex     Status: Abnormal  Collection Time: 05/24/15  7:27 PM  Result Value Ref Range Status   C Diff antigen POSITIVE (A) NEGATIVE Final   C Diff toxin NEGATIVE NEGATIVE Final   C Diff interpretation    Final    C. difficile present, but toxin not detected. This indicates colonization. In most cases, this does not require treatment. If patient has signs and symptoms consistent with colitis, consider treatment. Requires ENTERIC precautions.     Labs: Basic Metabolic Panel:  Recent Labs Lab 05/24/15 0522 05/25/15 0515 05/25/15 1450 05/26/15 0545 05/27/15 0530 05/30/15 0517  NA 131* 133*  --  135 136 139  K 5.4* 3.4*  --  3.3* 3.9 3.7  CL 108 99*  --  99* 102 102  CO2 10* 23  --  25 23 26   GLUCOSE 107* 126*  --  110* 99 89  BUN 94* 84*  --  73* 62* 57*  CREATININE 6.73* 6.05*  --  5.49* 5.23* 4.86*  CALCIUM 8.1* 7.5*  --  8.1* 8.0* 8.4*  MG  --   --  1.6*  --   --   --    Liver Function Tests:  Recent Labs Lab 05/24/15 0522 05/25/15 0515  AST 23 27  ALT 21 24  ALKPHOS 75 61  BILITOT 0.4 0.7  PROT 5.5* 4.7*  ALBUMIN 2.4* 1.8*   No results for input(s): LIPASE, AMYLASE in the last 168 hours. No results for input(s): AMMONIA in the last 168 hours. CBC:  Recent Labs Lab 05/24/15 0522 05/25/15 0515 05/26/15 0545  WBC 28.6* 8.9 5.9  NEUTROABS 28.0* 8.3*  --   HGB 10.1* 9.4* 9.6*  HCT 33.1* 29.1* 30.1*  MCV 91.2 86.6 86.5  PLT 303 199 189   Cardiac Enzymes: No results for input(s): CKTOTAL, CKMB, CKMBINDEX, TROPONINI in the last 168 hours. BNP: BNP (last 3 results)  Recent Labs  02/26/15 0530 05/23/15 0602  BNP 35.3 37.2    ProBNP (last 3 results) No results for input(s): PROBNP in the last 8760 hours.  CBG: No results for input(s): GLUCAP in the last 168 hours.

## 2015-05-30 NOTE — Discharge Instructions (Signed)
Percutaneous Nephrostomy Percutaneous nephrostomy is the insertion of a flexible tube into your kidney through your back. This is done to provide access to an obstructed kidney. The goal of this procedure is to allow the urine that is produced in the kidney to drain, which will relieve pressure or infection from damaging your kidney. This will allow your health care provider to identify the cause of the obstruction and plan appropriate treatment. LET [facility] CARE PROVIDER KNOW ABOUT:  Any allergies you have.  All medicines you are taking, including vitamins, herbs, eye drops, creams, and over-the-counter medicines.  Previous problems you or members of your family have had with the use of anesthetics.  Any blood disorders you have.  Previous surgeries you have had.  Medical conditions you have.  Possibility of pregnancy, if this applies. RISKS AND COMPLICATIONS Generally, this is a safe procedure. However, as with any procedure, problems can occur. Possible problems include:  Infection.  Damage to the organs surrounding your kidney. BEFORE THE PROCEDURE Your health care provider may want you to have blood tests. These tests can help tell how well your kidneys and liver are working. They can also show how well your blood clots. If you take anticoagulant medicine, sometimes called blood thinners, ask your health care provider when you should stop taking them. Make arrangements for someone to take you home after the procedure, if needed. PROCEDURE The procedure is performed as follows:  An intravenous IV catheter will be inserted into one of the veins in your arm. Medicine will be able to flow directly into your body through this catheter. You may be given medicines through this tube to help prevent nausea and pain, and antibiotics to help prevent infection.   You will be placed on your stomach and given medicine that numbs the site (local anesthetic) where the percutaneous  nephrostomy tube will be inserted.  You will be given a medicine that makes you go to sleep (general anesthetic).  The percutaneous nephrostomy tube, which is thin and flexible, will be inserted into a needle.  The needle will be inserted into your body and guided to your kidney with the help of an imaging method that uses X-ray images (fluoroscopy).  A dye will be injected through the nephrostomy tube. Then, X-ray images that highlight your kidney will be taken.  The needle is then removed, but the nephrostomy tube will be left in your kidney. The tube will drain urine from your kidney to a collection bag outside your body. The tube is usually secured to your skin with stitches (sutures). AFTER THE PROCEDURE   You will stay in a recovery area until the sedation has worn off. Your blood pressure and pulse will be checked.  You will need to remain lying down for several hours.   This information is not intended to replace advice given to you by your health care provider. Make sure you discuss any questions you have with your health care provider.   Document Released: 03/27/2013 Document Revised: 06/27/2014 Document Reviewed: 03/27/2013 Elsevier Interactive Patient Education 2016 Elsevier Inc. Doxycycline delayed-release capsules What is this medicine? DOXYCYCLINE (dox i SYE kleen) is a tetracycline antibiotic. It is used to treat certain kinds of bacterial infections, Lyme disease, and malaria. It will not work for colds, flu, or other viral infections. This medicine may be used for other purposes; ask your health care provider or pharmacist if you have questions. What should I tell my health care provider before I take this medicine?  They need to know if you have any of these conditions: -bowel disease like colitis -liver disease -long exposure to sunlight like working outdoors -an unusual or allergic reaction to doxycycline, tetracycline antibiotics, other medicines, foods, dyes, or  preservatives -pregnant or trying to get pregnant -breast-feeding How should I use this medicine? Take this medicine by mouth with a full glass of water. Follow the directions on the prescription label. Do not crush or chew. The capsules may be opened and the pellets sprinkled on applesauce. Swallow the pellets whole without chewing. Follow with an 8 ounce glass of water to help you swallow all the pellets. Do not prepare a dose and store for later use. The applesauce mixture should be taken immediately after you prepare it. It is best to take this medicine without other food, but if it upsets your stomach take it with food. Take your medicine at regular intervals. Do not take your medicine more often than directed. Take all of your medicine as directed even if you think your are better. Do not skip doses or stop your medicine early. Talk to your pediatrician regarding the use of this medicine in children. While this drug may be prescribed for selected conditions, precautions do apply. Overdosage: If you think you have taken too much of this medicine contact a poison control center or emergency room at once. NOTE: This medicine is only for you. Do not share this medicine with others. What if I miss a dose? If you miss a dose, take it as soon as you can. If it is almost time for your next dose, take only that dose. Do not take double or extra doses. What may interact with this medicine? -antacids -barbiturates -birth control pills -bismuth subsalicylate -carbamazepine -methoxyflurane -other antibiotics -phenytoin -vitamins that contain iron -warfarin This list may not describe all possible interactions. Give your health care provider a list of all the medicines, herbs, non-prescription drugs, or dietary supplements you use. Also tell them if you smoke, drink alcohol, or use illegal drugs. Some items may interact with your medicine. What should I watch for while using this medicine? Tell your  doctor or health care professional if your symptoms do not improve. Do not treat diarrhea with over the counter products. Contact your doctor if you have diarrhea that lasts more than 2 days or if it is severe and watery. Do not take this medicine just before going to bed. It may not dissolve properly when you lay down and can cause pain in your throat. Drink plenty of fluids while taking this medicine to also help reduce irritation in your throat. This medicine can make you more sensitive to the sun. Keep out of the sun. If you cannot avoid being in the sun, wear protective clothing and use sunscreen. Do not use sun lamps or tanning beds/booths. If you are being treated for a sexually transmitted infection, avoid sexual contact until you have finished your treatment. Your sexual partner may also need treatment. Avoid antacids, aluminum, calcium, magnesium, and iron products for 4 hours before and 2 hours after taking a dose of this medicine. Birth control pills may not work properly while you are taking this medicine. Talk to your doctor about using an extra method of birth control. If you are using this medicine to prevent malaria, you should still protect yourself from contact with mosquitos. Stay in screened-in areas, use mosquito nets, keep your body covered, and use an insect repellent. What side effects may I notice from receiving  this medicine? Side effects that you should report to your doctor or health care professional as soon as possible: -allergic reactions like skin rash, itching or hives, swelling of the face, lips, or tongue -difficulty breathing -fever -itching in the rectal or genital area -pain on swallowing -redness, blistering, peeling or loosening of the skin, including inside the mouth -severe stomach pain or cramps -unusual bleeding or bruising -unusually weak or tired -yellowing of the eyes or skin Side effects that usually do not require medical attention (report to your  doctor or health care professional if they continue or are bothersome): -diarrhea -loss of appetite -nausea, vomiting This list may not describe all possible side effects. Call your doctor for medical advice about side effects. You may report side effects to FDA at 1-800-FDA-1088. Where should I keep my medicine? Keep out of the reach of children. Store at room temperature, below 25 degrees C (77 degrees F). Protect from light. Keep container tightly closed. Throw away any unused medicine after the expiration date. Taking this medicine after the expiration date can make you seriously ill. NOTE: This sheet is a summary. It may not cover all possible information. If you have questions about this medicine, talk to your doctor, pharmacist, or health care provider.    2016, Elsevier/Gold Standard. (2014-09-26 12:05:23)

## 2015-05-31 DIAGNOSIS — I10 Essential (primary) hypertension: Secondary | ICD-10-CM

## 2015-05-31 NOTE — Care Management Note (Addendum)
Case Management Note  Patient Details  Name: RHAYA BUENING MRN: [id number] Date of Birth: 12-Jan-1946  Subjective/Objective:   nephrolithiasis with intermittent bilateral hydronephrosis, ureteral stent and right-sided nephrostomy                 Action/Plan: Pt is active with [facility] for [facility] RN, notified attending for resumption of care orders. [facility] aware of dc home today.   Expected Discharge Date:  05/31/2015               Expected Discharge Plan:  Home Health  In-House Referral:  NA  Discharge planning Services  Case Management  Post Acute Care Choice:  Home Health, Resumption of Care Choice offered to:  Patient  DME Arranged:  NA DME Agency:  NA  HH Arranged:  RN [place] Agency:  [facility]  Status of Service:  complete  Medicare Important Message Given:  Yes Date Medicare IM Given:    Medicare IM give by:    Date Additional Medicare IM Given:    Additional Medicare Important Message give by:     If discussed at [place] of Stay Meetings, dates discussed:    Additional Comments:  Erenest Rasher, RN 05/31/2015, 11:26 AM

## 2015-05-31 NOTE — Progress Notes (Signed)
Pt seen and examined at the bedside. Stable for discharge. Please refer to d/c summary completed 05/30/2015. No changes in medications since yesterday. We talked about containing doxycyline through 12/16. She knows to follow up for PCN exchange.  Leisa Lenz [facility] [id number]

## 2015-05-31 NOTE — Progress Notes (Signed)
Pt leaving at this time with her daughter. Alert, oriented, and without c/o. Discharge instructions/prescriptions given/explained with pt and her daughter verbalizing understanding. Daughter and pt aware of right nephrostomy care. Supplies given. Followup appointments noted.

## 2015-06-01 ENCOUNTER — Encounter (HOSPITAL_COMMUNITY): Payer: Self-pay | Admitting: *Deleted

## 2015-06-01 MED ORDER — DEXTROSE 5 % IV SOLN
1.5000 g | INTRAVENOUS | Status: AC
  Administered 2015-06-02: 1.5 g via INTRAVENOUS
  Filled 2015-06-01: qty 1.5

## 2015-06-01 NOTE — Progress Notes (Addendum)
Pt has history of A-fib which she states was 3 years ago. Pt is followed by Dr. Irish Lack for A-fib and "MI in the setting of sepsis". Last OV to Dr. Irish Lack was June, 2016.  Pt was just discharged from [facility] yesterday for UTI. She states she had diarrhea while she was there but was told it was negative for C-diff. Her chart has been flagged with a positive C-diff from last week. Pt will need Enteric precautions.

## 2015-06-01 NOTE — Progress Notes (Signed)
Anesthesia Chart Review: SAME DAY WORK-UP.  Patient is a 69 year old female scheduled for left brachiocephalic AVF creation tomorrow by Dr. Bridgett Larsson.  History includes recent hospitalization 05/22/15-05/31/15 at [facility] for acute pyelonephritis and also had afib with RVR (in the setting of fever 103; recurrent since 03/2012 but I don't see addressed in MD progress notes) and MSSA and yeast positive urine culture (see by ID) and underwent urgent percutaneous right nephrostomy tube placement by IR on 05/23/15. She has known history of recurrent nephrolithiasis with bilateral hydronephrosis with multiple recent urological procedures and admission as outlined below:  - She was admitted with pyelonephritis and sepsis and required right ureteral stent 10/28/14.  - Electively admitted 11/21/14 with acute cystitis with hematuria and underwent right ureteroscopy and stone removal and right ureteral stent.  - Readmitted 11/22/14 - 11/27/14 with urosepsis/pyelonephritis with AKI and C. Difficile colitis.  - Readmitted 02/25/15 - 03/01/15 with urosepsis and right ureteral stricture s/p right JJ stent placement.  - Readmitted 04/07/15 for acute on chronic kidney injury (Cr > 8; up from baseline ~ 3.5) and pyelonephritis/UTI and IR placed a right IJ tunneled HD catheter and right nephrostomy tube.  - Readmitted 05/02/15 - 05/07/15 for pyelonephritis with sepsis and nephrolithiasis s/p removal of right JJ stent, placement of right JJ catheter into right renal pelvis, placement of left JJ stent into left renal pelvis. BP meds including metoprolol tartrate and amlodipine held at that time due to hypotension, treated with IVF.  Other history includes afib and MI (type II; demand ischemia) and cardiomyopathy (EF 20-25%, now resolved) in the setting of pyelonephritis with obstructive nephrolithiasis and septic shock 03/2012 (normal coronaries 2014 cath), CKD stage V (not currently on HD), diastolic CHF, non-smoker, GERD, glaucoma, hiatal  hernia, migraines, PNA 09/2014, HLD, HTN, PVD, iron deficiency anemia, C. Difficile colitis 11/2014 (* see below for ID recommendations from 05/2015). For anesthesia history, reports being told that to use " a little anesthesia" due to "quit breathing."   *Seen by ID Dr. Scharlene Gloss 05/27/15 due to positive C diff antigen but negative C diff toxin. His note indicates, "C. difficile present, but toxin not detected. This indicates colonization. In most cases, this does not require treatment. If patient has signs and symptoms consistent with colitis, consider treatment. Requires ENTERIC precautions.  PCP is Dr. Felipa Eth. Urologist is Dr. Gaynelle Arabian. Nephrologist is Dr. Florene Glen.  Cardiologist is Dr. Irish Lack, last visit 11/21/14.   Meds include acidophilus, Lumigan, doxycycline, Norco, Zofran, Zantac, Florastor, sodium bicarbonate, Betimol.   05/25/15 EKG (in the setting of temp 103 and WBC 28K to 9K): Afib with RVR at 149 bpm, non-specific ST abnormality. (Had been in [place] 05/22/15 and on prior EKG occuring after 03/2012. Had afib in the setting of sepsis 03/2012.) HR was in the 80's on discharge from [facility] yesterday. She will need a new EKG on arrival to determine underlying rhythm.  11/23/14 Echo: Study Conclusions - Left ventricle: The cavity size was normal. Wall thickness was increased in a pattern of mild LVH. Systolic function was vigorous. The estimated ejection fraction was in the range of 65% to 70%. Wall motion was normal; there were no regional wall motion abnormalities. - Left atrium: The atrium was mildly dilated.  07/11/12 Cardiac cath:  IMPRESSIONS: 1. Normal left main coronary artery. 2. Normal left anterior descending artery and its branches. 3. Small left circumflex artery and its branches. 4. Normal right coronary artery. 5. LVEDP 13 mmHg. RECOMMENDATION: Continue preventive therapy. Patient will be aggressively  hydrated post procedure with saline and bicarb drips.  Contrast use was minimized due to renal insufficiency.  05/23/15 1V CXR: IMPRESSION: Left lung base subsegmental atelectasis. Developing pneumonia is not excluded. Clinical correlation is recommended.  She will need labs and repeat EKG on arrival.    Above discussed with anesthesiologist Dr. Deatra Canter. Known type II MI and afib back in 2013 in the setting of urosepsis. Normal coronaries in 2014. Has had on-going follow-up with cardiologist Dr. Irish Lack, last visit 11/2014. Multiple hospitalizations within the past six months. Was taken off her b-blocker in 04/2015 due to hypotension. Last re-admitted last week with recurrent pyelonephritis and sepsis and developed rapid afib in the setting of high fever. Unfortunately, I don't see it addressed in the progress notes, so I'm unsure if her afib persisted. HR was in the 80's at discharge though. Her prior episode of afib in 2013 was felt related to stress of illness which could also explain this most recent episode. She is no longer on a b-blocker because it was never resumed after her 04/2015 hospitalization. Will re-evaluate her heart rhythm on EKG tomorrow on arrival. If she is back in SR and labs results are acceptable then would anticipate that she could proceed as planned. If she has persistent afib, then cardiology would likely need to be contacted to communicate this for additional recommendations (since afib would be new since she was last seen). At 4:45 pm, I updated [facility] RN Colletta Maryland of recent admission and need for repeat EKG on arrival tomorrow. She will notify Dr. Bridgett Larsson. Patient is currently scheduled as a first case.  George Hugh [facility] Short Stay [facility]/Anesthesiology Phone [phone] 06/01/2015 5:08 PM

## 2015-06-02 ENCOUNTER — Ambulatory Visit (HOSPITAL_COMMUNITY)
Admission: RE | Admit: 2015-06-02 | Discharge: 2015-06-02 | Disposition: A | Discharge status: Home / Self Care | Payer: Medicare Other | Source: Ambulatory Visit | Attending: Vascular Surgery | Admitting: Vascular Surgery

## 2015-06-02 ENCOUNTER — Encounter (HOSPITAL_COMMUNITY)
Admission: RE | Disposition: A | Discharge status: Home / Self Care | Payer: Self-pay | Source: Ambulatory Visit | Attending: Vascular Surgery

## 2015-06-02 ENCOUNTER — Encounter (HOSPITAL_COMMUNITY): Payer: Self-pay | Admitting: Anesthesiology

## 2015-06-02 ENCOUNTER — Ambulatory Visit (HOSPITAL_COMMUNITY): Payer: Medicare Other | Admitting: Vascular Surgery

## 2015-06-02 ENCOUNTER — Telehealth: Payer: Self-pay | Admitting: Vascular Surgery

## 2015-06-02 DIAGNOSIS — K219 Gastro-esophageal reflux disease without esophagitis: Secondary | ICD-10-CM | POA: Diagnosis not present

## 2015-06-02 DIAGNOSIS — N185 Chronic kidney disease, stage 5: Secondary | ICD-10-CM | POA: Diagnosis not present

## 2015-06-02 DIAGNOSIS — I132 Hypertensive heart and chronic kidney disease with heart failure and with stage 5 chronic kidney disease, or end stage renal disease: Secondary | ICD-10-CM | POA: Insufficient documentation

## 2015-06-02 DIAGNOSIS — N186 End stage renal disease: Secondary | ICD-10-CM | POA: Insufficient documentation

## 2015-06-02 DIAGNOSIS — I5032 Chronic diastolic (congestive) heart failure: Secondary | ICD-10-CM | POA: Diagnosis not present

## 2015-06-02 DIAGNOSIS — Z79899 Other long term (current) drug therapy: Secondary | ICD-10-CM | POA: Diagnosis not present

## 2015-06-02 DIAGNOSIS — I739 Peripheral vascular disease, unspecified: Secondary | ICD-10-CM | POA: Diagnosis not present

## 2015-06-02 DIAGNOSIS — E785 Hyperlipidemia, unspecified: Secondary | ICD-10-CM | POA: Insufficient documentation

## 2015-06-02 DIAGNOSIS — I2510 Atherosclerotic heart disease of native coronary artery without angina pectoris: Secondary | ICD-10-CM | POA: Diagnosis not present

## 2015-06-02 DIAGNOSIS — I4891 Unspecified atrial fibrillation: Secondary | ICD-10-CM | POA: Insufficient documentation

## 2015-06-02 DIAGNOSIS — I252 Old myocardial infarction: Secondary | ICD-10-CM | POA: Diagnosis not present

## 2015-06-02 DIAGNOSIS — Z936 Other artificial openings of urinary tract status: Secondary | ICD-10-CM | POA: Insufficient documentation

## 2015-06-02 HISTORY — PX: AV FISTULA PLACEMENT: SHX1204

## 2015-06-02 LAB — POCT I-STAT 4, (NA,K, GLUC, HGB,HCT)
Glucose, Bld: 89 mg/dL (ref 65–99)
HCT: 30 % — ABNORMAL LOW (ref 36.0–46.0)
Hemoglobin: 10.2 g/dL — ABNORMAL LOW (ref 12.0–15.0)
Potassium: 3.8 mmol/L (ref 3.5–5.1)
Sodium: 140 mmol/L (ref 135–145)

## 2015-06-02 SURGERY — ARTERIOVENOUS (AV) FISTULA CREATION
Anesthesia: Monitor Anesthesia Care | Site: Arm Upper | Laterality: Left

## 2015-06-02 MED ORDER — LIDOCAINE HCL (PF) 1 % IJ SOLN
INTRAMUSCULAR | Status: AC
  Filled 2015-06-02: qty 30

## 2015-06-02 MED ORDER — SODIUM CHLORIDE 0.9 % IJ SOLN
INTRAMUSCULAR | Status: AC
  Filled 2015-06-02: qty 10

## 2015-06-02 MED ORDER — ONDANSETRON HCL 4 MG/2ML IJ SOLN
INTRAMUSCULAR | Status: AC
  Filled 2015-06-02: qty 2

## 2015-06-02 MED ORDER — HEMOSTATIC AGENTS (NO CHARGE) OPTIME
TOPICAL | Status: DC | PRN
  Administered 2015-06-02: 1 via TOPICAL

## 2015-06-02 MED ORDER — SODIUM CHLORIDE 0.9 % IV SOLN: INTRAVENOUS | Status: DC

## 2015-06-02 MED ORDER — LIDOCAINE HCL (PF) 1 % IJ SOLN
INTRAMUSCULAR | Status: DC | PRN
  Administered 2015-06-02: 8 mL via INTRADERMAL

## 2015-06-02 MED ORDER — LIDOCAINE HCL (CARDIAC) 20 MG/ML IV SOLN
INTRAVENOUS | Status: AC
  Filled 2015-06-02: qty 5

## 2015-06-02 MED ORDER — LIDOCAINE HCL (CARDIAC) 20 MG/ML IV SOLN
INTRAVENOUS | Status: DC | PRN
  Administered 2015-06-02 (×2): 50 mg via INTRAVENOUS

## 2015-06-02 MED ORDER — MIDAZOLAM HCL 2 MG/2ML IJ SOLN
INTRAMUSCULAR | Status: AC
  Filled 2015-06-02: qty 2

## 2015-06-02 MED ORDER — MIDAZOLAM HCL 5 MG/5ML IJ SOLN
INTRAMUSCULAR | Status: DC | PRN
  Administered 2015-06-02: 2 mg via INTRAVENOUS

## 2015-06-02 MED ORDER — SODIUM CHLORIDE 0.9 % IV SOLN
INTRAVENOUS | Status: DC | PRN
  Administered 2015-06-02: 07:00:00 via INTRAVENOUS

## 2015-06-02 MED ORDER — HEPARIN SODIUM (PORCINE) 1000 UNIT/ML IJ SOLN
INTRAMUSCULAR | Status: AC
  Filled 2015-06-02: qty 1

## 2015-06-02 MED ORDER — PROPOFOL 10 MG/ML IV BOLUS
INTRAVENOUS | Status: DC | PRN
  Administered 2015-06-02: 20 mg via INTRAVENOUS

## 2015-06-02 MED ORDER — FENTANYL CITRATE (PF) 100 MCG/2ML IJ SOLN
INTRAMUSCULAR | Status: DC | PRN
  Administered 2015-06-02 (×3): 50 ug via INTRAVENOUS
  Administered 2015-06-02: 100 ug via INTRAVENOUS

## 2015-06-02 MED ORDER — FENTANYL CITRATE (PF) 250 MCG/5ML IJ SOLN
INTRAMUSCULAR | Status: AC
  Filled 2015-06-02: qty 5

## 2015-06-02 MED ORDER — ONDANSETRON HCL 4 MG/2ML IJ SOLN
INTRAMUSCULAR | Status: DC | PRN
  Administered 2015-06-02: 4 mg via INTRAVENOUS

## 2015-06-02 MED ORDER — PROPOFOL 500 MG/50ML IV EMUL
INTRAVENOUS | Status: DC | PRN
  Administered 2015-06-02: 15 ug/kg/min via INTRAVENOUS

## 2015-06-02 MED ORDER — CHLORHEXIDINE GLUCONATE CLOTH 2 % EX PADS: 6.0000 | MEDICATED_PAD | Freq: Once | CUTANEOUS | Status: DC

## 2015-06-02 MED ORDER — ROCURONIUM BROMIDE 50 MG/5ML IV SOLN
INTRAVENOUS | Status: AC
  Filled 2015-06-02: qty 1

## 2015-06-02 MED ORDER — HYDROCODONE-ACETAMINOPHEN 5-325 MG PO TABS: 1.0000 | ORAL_TABLET | ORAL | Status: DC | PRN

## 2015-06-02 MED ORDER — 0.9 % SODIUM CHLORIDE (POUR BTL) OPTIME
TOPICAL | Status: DC | PRN
  Administered 2015-06-02: 1000 mL

## 2015-06-02 MED ORDER — SODIUM CHLORIDE 0.9 % IV SOLN
INTRAVENOUS | Status: DC | PRN
  Administered 2015-06-02: 08:00:00

## 2015-06-02 MED ORDER — EPHEDRINE SULFATE 50 MG/ML IJ SOLN
INTRAMUSCULAR | Status: AC
  Filled 2015-06-02: qty 1

## 2015-06-02 SURGICAL SUPPLY — 32 items
AGENT HMST SPONGE THK3/8 (HEMOSTASIS) ×1
ARMBAND PINK RESTRICT EXTREMIT (MISCELLANEOUS) ×3 IMPLANT
BLADE SURG 11 STRL SS (BLADE) ×1 IMPLANT
CANISTER SUCTION 2500CC (MISCELLANEOUS) ×2 IMPLANT
CLIP TI MEDIUM 6 (CLIP) ×2 IMPLANT
CLIP TI WIDE RED SMALL 6 (CLIP) ×2 IMPLANT
COVER PROBE W GEL 5X96 (DRAPES) ×2 IMPLANT
DECANTER SPIKE VIAL GLASS SM (MISCELLANEOUS) ×2 IMPLANT
ELECT REM PT RETURN 9FT ADLT (ELECTROSURGICAL) ×2
ELECTRODE REM PT RTRN 9FT ADLT (ELECTROSURGICAL) ×1 IMPLANT
GLOVE BIO SURGEON STRL SZ7 (GLOVE) ×3 IMPLANT
GLOVE BIOGEL PI IND STRL 7.5 (GLOVE) ×1 IMPLANT
GLOVE BIOGEL PI INDICATOR 7.5 (GLOVE) ×2
GLOVE ECLIPSE 7.0 STRL STRAW (GLOVE) ×1 IMPLANT
GLOVE SURG SS PI 7.0 STRL IVOR (GLOVE) ×2 IMPLANT
GOWN STRL REUS W/ TWL LRG LVL3 (GOWN DISPOSABLE) ×3 IMPLANT
GOWN STRL REUS W/TWL LRG LVL3 (GOWN DISPOSABLE) ×6
HEMOSTAT SPONGE AVITENE ULTRA (HEMOSTASIS) ×1 IMPLANT
KIT BASIN OR (CUSTOM PROCEDURE TRAY) ×2 IMPLANT
KIT ROOM TURNOVER OR (KITS) ×2 IMPLANT
LIQUID BAND (GAUZE/BANDAGES/DRESSINGS) ×2 IMPLANT
NS IRRIG 1000ML POUR BTL (IV SOLUTION) ×2 IMPLANT
PACK CV ACCESS (CUSTOM PROCEDURE TRAY) ×2 IMPLANT
PAD ARMBOARD 7.5X6 YLW CONV (MISCELLANEOUS) ×4 IMPLANT
SPONGE SURGIFOAM ABS GEL 100 (HEMOSTASIS) IMPLANT
SUT MNCRL AB 4-0 PS2 18 (SUTURE) ×2 IMPLANT
SUT PROLENE 6 0 BV (SUTURE) ×1 IMPLANT
SUT PROLENE 7 0 BV 1 (SUTURE) ×3 IMPLANT
SUT VIC AB 3-0 SH 27 (SUTURE) ×2
SUT VIC AB 3-0 SH 27X BRD (SUTURE) ×1 IMPLANT
UNDERPAD 30X30 INCONTINENT (UNDERPADS AND DIAPERS) ×2 IMPLANT
WATER STERILE IRR 1000ML POUR (IV SOLUTION) ×2 IMPLANT

## 2015-06-02 NOTE — Op Note (Signed)
OPERATIVE NOTE   PROCEDURE: left brachiocephalic arteriovenous fistula placement  PRE-OPERATIVE DIAGNOSIS: end stage renal disease   POST-OPERATIVE DIAGNOSIS: same as above   SURGEON: Adele Barthel, MD  ASSISTANT(S): Gerri Lins, PAC   ANESTHESIA: local and MAC  ESTIMATED BLOOD LOSS: 50 cc  FINDING(S): 1.  Palpable thrill at end of case with dopplerable radial artery signal 2.  Sclerotic segment proximal to anastomosis  SPECIMEN(S):  none  INDICATIONS:   [name] is a 69 y.o. female who presents with end stage renal disease.  The patient is scheduled for left brachiocephalic arteriovenous fistula placement.  The patient is aware the risks include but are not limited to: bleeding, infection, steal syndrome, nerve damage, ischemic monomelic neuropathy, failure to mature, and need for additional procedures.  The patient is aware of the risks of the procedure and elects to proceed forward.  DESCRIPTION: After full informed written consent was obtained from the patient, the patient was brought back to the operating room and placed supine upon the operating table.  Prior to induction, the patient received IV antibiotics.   After obtaining adequate anesthesia, the patient was then prepped and draped in the standard fashion for a left arm access procedure.  I turned my attention first to identifying the patient's cephalic vein and brachial artery.  Using SonoSite guidance, the location of these vessels were marked out on the skin.   At this point, I injected local anesthetic to obtain a field block of the antecubitum.  In total, I injected about 10 mL of 1% lidocaine without epinephrine.  I made a transverse incision at the level of the antecubitum and dissected through the subcutaneous tissue and fascia to gain exposure of the brachial artery.  This was noted to be 3-4 mm in diameter externally.  This was dissected out proximally and distally and controlled with vessel loops .  I  then dissected out the cephalic vein.  This was noted to be 3 mm in diameter externally.  The distal segment of the vein was ligated with a  2-0 silk, and the vein was transected.  The proximal segment was interrogated with serial dilators.  The vein accepted up to a 3 mm dilator without any difficulty.  I then instilled the heparinized saline into the vein and clamped it.  At this point, I reset my exposure of the brachial artery and placed the artery under tension proximally and distally.  I made an arteriotomy with a #11 blade, and then I extended the arteriotomy with a Potts scissor.  I injected heparinized saline proximal and distal to this arteriotomy.  The vein was then sewn to the artery in an end-to-side configuration with a running stitch of 7-0 Prolene.  Prior to completing this anastomosis, I allowed the vein and artery to backbleed.  There was no evidence of clot from any vessels.  I completed the anastomosis in the usual fashion and then released all vessel loops and clamps.  There was a palpable thrill in the venous outflow, and there was a dopplerable radial signal.  I checked the fistula for any tethering point and then lysed any tethering points.  The fistula segment proximal to the anastomosis appeared to be somewhat sclerotic.  At this point, I irrigated out the surgical wound.  There was no further active bleeding.  The subcutaneous tissue was reapproximated with a running stitch of 3-0 Vicryl.  The skin was then reapproximated with a running subcuticular stitch of 4-0 Vicryl.  The  skin was then cleaned, dried, and reinforced with Dermabond.  The patient tolerated this procedure well.    COMPLICATIONS: none  CONDITION: stable   Adele Barthel, MD [facility] Office: [phone] Pager: [phone]  06/02/2015, 9:01 AM

## 2015-06-02 NOTE — Anesthesia Procedure Notes (Signed)
Procedure Name: MAC Date/Time: 06/02/2015 7:45 AM Performed by: Jacquiline Doe A Pre-anesthesia Checklist: Patient identified, Emergency Drugs available, Suction available, Patient being monitored and Timeout performed Patient Re-evaluated:Patient Re-evaluated prior to inductionOxygen Delivery Method: Simple face mask Intubation Type: IV induction Placement Confirmation: positive ETCO2 Dental Injury: Teeth and Oropharynx as per pre-operative assessment

## 2015-06-02 NOTE — Transfer of Care (Signed)
Immediate Anesthesia Transfer of Care Note  Patient: [name]  Procedure(s) Performed: Procedure(s): BRACHIOCEPHALIC ARTERIOVENOUS (AV) FISTULA CREATION  (Left)  Patient Location: PACU  Anesthesia Type:MAC  Level of Consciousness: awake, alert , oriented, patient cooperative and responds to stimulation  Airway & Oxygen Therapy: Patient Spontanous Breathing and Patient connected to nasal cannula oxygen  Post-op Assessment: Report given to RN, Post -op Vital signs reviewed and stable, Patient moving all extremities and Patient moving all extremities X 4  Post vital signs: Reviewed and stable  Last Vitals:  Filed Vitals:   06/02/15 0647  BP: 131/51  Pulse: 78  Temp: 36.8 C  Resp: 20    Complications: No apparent anesthesia complications

## 2015-06-02 NOTE — Anesthesia Preprocedure Evaluation (Signed)
Anesthesia Evaluation  Patient identified by MRN, date of birth, ID band Patient awake    Reviewed: Allergy & Precautions, NPO status , Patient's Chart, lab work & pertinent test results  Airway Mallampati: I  TM Distance: >3 FB Neck ROM: Full    Dental   Pulmonary    Pulmonary exam normal        Cardiovascular hypertension, + CAD and + Past MI  Normal cardiovascular exam+ dysrhythmias Atrial Fibrillation      Neuro/Psych    GI/Hepatic GERD  Medicated and Controlled,  Endo/Other    Renal/GU CRFRenal disease     Musculoskeletal   Abdominal   Peds  Hematology   Anesthesia Other Findings   Reproductive/Obstetrics                             Anesthesia Physical Anesthesia Plan  ASA: III  Anesthesia Plan: MAC   Post-op Pain Management:    Induction: Intravenous  Airway Management Planned: Natural Airway  Additional Equipment:   Intra-op Plan:   Post-operative Plan:   Informed Consent: I have reviewed the patients History and Physical, chart, labs and discussed the procedure including the risks, benefits and alternatives for the proposed anesthesia with the patient or authorized representative who has indicated his/her understanding and acceptance.     Plan Discussed with: CRNA and Surgeon  Anesthesia Plan Comments:         Anesthesia Quick Evaluation

## 2015-06-02 NOTE — H&P (View-Only) (Signed)
Referred by:  Lajean Manes, MD [address] [place], [place] [address]  Reason for referral: New access  History of Present Illness  [name] is a 69 y.o. (01/10/1946) female who presents for evaluation for permanent access.  The patient is right hand dominant.  The patient has not had previous access procedures.  Previous central venous cannulation procedures include: RIJV TDC.  The patient has never had a PPM placed.  The patient briefly start HD then was able to come off HD rapidly.   Past Medical History  Diagnosis Date  . GERD (gastroesophageal reflux disease)   . Glaucoma   . H/O renal calculi 2002 & 2006  . H/O hiatal hernia   . Headache(784.0)     migraine  . Pneumonia     dx 10-06-2014 per CXR--  on 10-27-2014 pt states finished antibiotic and denies cough or fever  . Hyperlipidemia   . History of MI (myocardial infarction)     10/ 2013 in setting of Septic Shock  . History of atrial fibrillation     10/ 2013  in setting of Septic Shock  . History of CHF (congestive heart failure)     10/ 2013 in setting of septic shock  . Sigmoid diverticulosis   . Nephrolithiasis     bilateral  . CKD (chronic kidney disease), stage III     nephrologist-  dr Florene Glen  . Right ureteral stone   . History of acute respiratory failure     10/ 2013  -- ventilated in setting septic shock  . Dysrhythmia     Afib in 2013 when she had septic shock related to stone ureteral obstruction  . Myocardial infarction [facility])     Was reported in 2013 during hospitalization with septic shock  . Septic shock ([place]) 04/04/2012  . Sepsis ([place])   . Complication of anesthesia     use a little anesthesia , per patient MD states she quit breathing   . Hypertension     medication removed from regimen due to low blood pressure   . Peripheral vascular disease ([place])   . S/P hemodialysis catheter insertion ([place]) 04/11/2015     right anterior chest , only used once   . History of  nephrostomy 04/11/2015     currently inplace 04/28/2015   . History of blood transfusion 04/13/2015     Past Surgical History  Procedure Laterality Date  . Cystoscopy w/ ureteral stent placement  04/04/2012    Procedure: CYSTOSCOPY WITH RETROGRADE PYELOGRAM/URETERAL STENT PLACEMENT;  Surgeon: Ailene Rud, MD;  Location: [place];  Service: Urology;  Laterality: Left;  . Total abdominal hysterectomy w/ bilateral salpingoophorectomy  1993    secondary to fibroids  . Breast biopsy Left 08/23/07    benign fibrocystic with duct ectasia  . Cardiac catheterization  07-11-2012  dr Irish Lack    Abnormal stress test/   normal coronary arteries/  LVEDP  56mmHg  . Cardiovascular stress test  06-26-2012  dr Irish Lack    marked ischemia in the basal anterior, mid anterior, apical inferior regions/  normal LVF, ef 63%  . Transthoracic echocardiogram  04-09-2012    normal LVF,  ef 60-65%,  mild LAE,  mild TR, trivial MR and PR  . Cataract extraction w/ intraocular lens  implant, bilateral    . Knee arthroscopy Left 02-14-2003  . Laparoscopic cholecystectomy  03-23-2005  . Extracorporeal shock wave lithotripsy  05-28-2012  &  10-08-2012  . Cystoscopy  with stent placement Right 10/28/2014    Procedure: RIGHT URETERAL STENT PLACEMENT;  Surgeon: Irine Seal, MD;  Location[facility];  Service: Urology;  Laterality: Right;  . Cystoscopy/retrograde/ureteroscopy/stone extraction with basket Right 11/21/2014    Procedure: CYSTOSCOPY/RIGHT RETROGRADE PYELOGRAM/RIGHT URETEROSCOPY/BASKET EXTRACTION/RIGHT PYELOSCOPY/LASER OF STONE/RIGHT DOUBLE J STENT;  Surgeon: Carolan Clines, MD;  Location: [facility];  Service: Urology;  Laterality: Right;  . Holmium laser application Right [id number]    Procedure: HOLMIUM LASER APPLICATION;  Surgeon: Carolan Clines, MD;  Location: [facility];  Service: Urology;  Laterality: Right;  . Cystoscopy with stent placement Right  02/26/2015    Procedure: CYSTOSCOPY RETROGRADE PYELOGRAM WITH STENT PLACEMENT;  Surgeon: Cleon Gustin, MD;  Location: [facility];  Service: Urology;  Laterality: Right;  . Cystoscopy w/ ureteral stent placement Bilateral 05/04/2015    Procedure: CYSTOSCOPY WITH BILATERAL RETROGRADE PYELOGRAM/ WITH INTERPRETATION, EXCHANGE OF RIGHT URETERAL STENT REPLACEMENT AND PLACEMENT LEFT URETERAL STENT PLACEMENT EXAMINATION OF VAGINA;  Surgeon: Carolan Clines, MD;  Location: [facility];  Service: Urology;  Laterality: Bilateral;    Social History   Social History  . Marital Status: Widowed    Spouse Name: N/A  . Number of Children: 1  . Years of Education: N/A   Occupational History  . retired   . legal assistant    Social History Main Topics  . Smoking status: Never Smoker   . Smokeless tobacco: Never Used  . Alcohol Use: No  . Drug Use: No  . Sexual Activity: No     Comment: widow husband passed 5/05 with lung cancer   Other Topics Concern  . Not on file   Social History Narrative    Family History  Problem Relation Age of Onset  . Hypertension Mother   . Cancer Mother 7    breast  . Dementia Mother   . Hypertension Brother   . Diabetes Brother   . Cancer Father 63    pancreatic  . Heart failure Paternal Grandmother   . Bladder Cancer Maternal Grandfather     Current Outpatient Prescriptions  Medication Sig Dispense Refill  . acetaminophen (TYLENOL) 500 MG tablet Take 500 mg by mouth every 6 (six) hours as needed for moderate pain.    Marland Kitchen acidophilus (RISAQUAD) CAPS capsule Take 2 capsules by mouth daily. 60 capsule 0  . bimatoprost (LUMIGAN) 0.01 % SOLN Place 1 drop into both eyes at bedtime.    . Nutritional Supplements (FEEDING SUPPLEMENT, NEPRO CARB STEADY,) LIQD Take 237 mLs by mouth daily. 30 Can 0  . ranitidine (ZANTAC) 150 MG tablet Take 150 mg by mouth 2 (two) times daily as needed for heartburn.    . saccharomyces boulardii (FLORASTOR) 250 MG capsule Take 1 capsule  (250 mg total) by mouth 2 (two) times daily. 60 capsule 0  . sodium chloride (OCEAN) 0.65 % SOLN nasal spray Place 1 spray into both nostrils 3 (three) times daily as needed for congestion.    . timolol (BETIMOL) 0.5 % ophthalmic solution Place 1 drop into both eyes every morning.     Marland Kitchen amoxicillin-clavulanate (AUGMENTIN) 250-125 MG tablet Take 1 tablet by mouth 2 (two) times daily. (Patient not taking: Reported on 05/20/2015) 10 tablet 0  . ENSURE PLUS (ENSURE PLUS) LIQD Take 237 mLs by mouth daily as needed (nutritional supplement).     Marland Kitchen HYDROcodone-acetaminophen (NORCO/VICODIN) 5-325 MG tablet Take 1 tablet by mouth 4 (four) times daily as needed for moderate pain.   0  No current facility-administered medications for this visit.    Allergies  Allergen Reactions  . Vicodin [Hydrocodone-Acetaminophen] Nausea And Vomiting  . Percocet [Oxycodone-Acetaminophen] Nausea And Vomiting    REVIEW OF SYSTEMS:  (Positives checked otherwise negative)  CARDIOVASCULAR:   [ ]  chest pain,  [ ]  chest pressure,  [ ]  palpitations,  [ ]  shortness of breath when laying flat,  [ ]  shortness of breath with exertion,   [ ]  pain in feet when walking,  [ ]  pain in feet when laying flat, [ ]  history of blood clot in veins (DVT),  [ ]  history of phlebitis,  [ ]  swelling in legs,  [ ]  varicose veins  PULMONARY:   [ ]  productive cough,  [ ]  asthma,  [ ]  wheezing  NEUROLOGIC:   [ ]  weakness in arms or legs,  [ ]  numbness in arms or legs,  [ ]  difficulty speaking or slurred speech,  [ ]  temporary loss of vision in one eye,  [ ]  dizziness  HEMATOLOGIC:   [ ]  bleeding problems,  [ ]  problems with blood clotting too easily  MUSCULOSKEL:   [ ]  joint pain, [ ]  joint swelling  GASTROINTEST:   [ ]  vomiting blood,  [ ]  blood in stool     GENITOURINARY:   [ ]  burning with urination,  [ ]  blood in urine  PSYCHIATRIC:   [ ]  history of major depression  INTEGUMENTARY:   [ ]  rashes,  [ ]   ulcers  CONSTITUTIONAL:   [ ]  fever,  [ ]  chills   Physical Examination  Filed Vitals:   05/20/15 1558  BP: 115/74  Pulse: 79  Temp: 97.6 F (36.4 C)  Resp: 14  Height: 5\' 3"  (1.6 m)  Weight: 170 lb (77.111 kg)  SpO2: 97%   Body mass index is 30.12 kg/(m^2).  General: A&O x 3, WDWN  Head: [place]/AT  Ear/Nose/Throat: Hearing grossly intact, nares w/o erythema or drainage, oropharynx w/o Erythema/Exudate, Mallampati score: 3  Eyes: PERRLA, EOMI  Neck: Supple, no nuchal rigidity, no palpable LAD, short thick ness  Pulmonary: Sym exp, good air movt, CTAB, no rales, rhonchi, & wheezing  Cardiac: RRR, Nl S1, S2, no Murmurs, rubs or gallops  Vascular: Vessel Right Left  Radial Palpable Palpable  Ulnar Palpable Palpable  Brachial Palpable Palpable  Carotid Palpable, without bruit Palpable, without bruit  Aorta Not palpable N/A  Femoral Palpable Palpable  Popliteal Not palpable Not palpable  PT Not Palpable Not Palpable  DP  Not Palpable Not Palpable   Gastrointestinal: soft, NTND, -G/R, - HSM, - masses, - CVAT B  Musculoskeletal: M/S 5/5 throughout , Extremities without ischemic changes   Neurologic: CN 2-12 intact , Pain and light touch intact in extremities , Motor exam as listed above  Psychiatric: Judgment intact, Mood & affect appropriate for pt's clinical situation  Dermatologic: See M/S exam for extremity exam, no rashes otherwise noted  Lymph : No Cervical, Axillary, or Inguinal lymphadenopathy   Non-Invasive Vascular Imaging  Vein Mapping  (Date: 04/12/15):   R arm: acceptable vein conduits include upper arm cephalic, upper arm basilic  L arm: acceptable vein conduits include upper arm cephalic and basilic   Medical Decision Making  [name] is a 69 y.o. female who presents with CKD Stage V.   Based on vein mapping and examination, this patient's permanent access options include: L BC AVF, L BVT (possible single stage), R BC AVF, R BVT  (possible single stage).  I would start with: L BC AVF  I had an extensive discussion with this patient in regards to the nature of access surgery, including risk, benefits, and alternatives.    The patient is aware that the risks of access surgery include but are not limited to: bleeding, infection, steal syndrome, nerve damage, ischemic monomelic neuropathy, failure of access to mature, and possible need for additional access procedures in the future.  The patient has agreed to proceed with the above procedure which will be scheduled 6 DEC 16.   Adele Barthel, MD [facility] Office: [phone] Pager: [phone]  05/20/2015, 4:42 PM

## 2015-06-02 NOTE — Telephone Encounter (Signed)
Spoke with pts daughter, dpm

## 2015-06-02 NOTE — Interval H&P Note (Signed)
History and Physical Interval Note:  06/02/2015 7:21 AM  [name]  has presented today for surgery, with the diagnosis of Chronic kidney disease stage 4  The various methods of treatment have been discussed with the patient and family. After consideration of risks, benefits and other options for treatment, the patient has consented to  Procedure(s): BRACHIOCEPHALIC ARTERIOVENOUS (AV) FISTULA CREATION  (Left) as a surgical intervention .  The patient's history has been reviewed, patient examined, no change in status, stable for surgery.  I have reviewed the patient's chart and labs.  Questions were answered to the patient's satisfaction.     Adele Barthel

## 2015-06-02 NOTE — Telephone Encounter (Signed)
-----   Message from Denman George, RN sent at 06/02/2015  9:45 AM EST ----- Regarding: needs 6 wk. f/u with Dr. Bridgett Larsson no vasc. study needed   ----- Message -----    From: Conrad Warrenton, MD    Sent: 06/02/2015   9:05 AM      To: [facility] [address]  [name] [id number] 1946-04-07  PROCEDURE: left brachiocephalic arteriovenous fistula placement  Asst: Gerri Lins, [facility]   Follow-up: 6 weeks

## 2015-06-02 NOTE — Anesthesia Postprocedure Evaluation (Signed)
Anesthesia Post Note  Patient: [name]  Procedure(s) Performed: Procedure(s) (LRB): BRACHIOCEPHALIC ARTERIOVENOUS (AV) FISTULA CREATION  (Left)  Patient location during evaluation: PACU Anesthesia Type: MAC Level of consciousness: awake and alert Pain management: pain level controlled Vital Signs Assessment: post-procedure vital signs reviewed and stable Respiratory status: spontaneous breathing, nonlabored ventilation, respiratory function stable and patient connected to nasal cannula oxygen Cardiovascular status: stable and blood pressure returned to baseline Anesthetic complications: no    Last Vitals:  Filed Vitals:   06/02/15 0930 06/02/15 0945  BP: 125/66 129/66  Pulse: 71 66  Temp:    Resp: 14 19    Last Pain:  Filed Vitals:   06/02/15 0953  PainSc: 1                  Romeka Scifres DAVID

## 2015-06-03 ENCOUNTER — Encounter (HOSPITAL_COMMUNITY): Payer: Self-pay | Admitting: Vascular Surgery

## 2015-06-05 ENCOUNTER — Ambulatory Visit (HOSPITAL_COMMUNITY)
Admission: RE | Admit: 2015-06-05 | Discharge: 2015-06-05 | Disposition: A | Discharge status: Home / Self Care | Payer: Medicare Other | Source: Ambulatory Visit | Attending: Radiology | Admitting: Radiology

## 2015-06-05 ENCOUNTER — Other Ambulatory Visit: Payer: Self-pay | Admitting: Radiology

## 2015-06-05 ENCOUNTER — Encounter (HOSPITAL_COMMUNITY)
Admission: RE | Admit: 2015-06-05 | Discharge: 2015-06-05 | Disposition: A | Discharge status: Home / Self Care | Payer: Medicare Other | Source: Ambulatory Visit | Attending: Nephrology | Admitting: Nephrology

## 2015-06-05 DIAGNOSIS — N1330 Unspecified hydronephrosis: Secondary | ICD-10-CM

## 2015-06-05 DIAGNOSIS — D631 Anemia in chronic kidney disease: Secondary | ICD-10-CM | POA: Diagnosis not present

## 2015-06-05 DIAGNOSIS — Z79899 Other long term (current) drug therapy: Secondary | ICD-10-CM | POA: Diagnosis not present

## 2015-06-05 DIAGNOSIS — Z5181 Encounter for therapeutic drug level monitoring: Secondary | ICD-10-CM | POA: Diagnosis not present

## 2015-06-05 DIAGNOSIS — Z436 Encounter for attention to other artificial openings of urinary tract: Secondary | ICD-10-CM | POA: Diagnosis not present

## 2015-06-05 DIAGNOSIS — N184 Chronic kidney disease, stage 4 (severe): Secondary | ICD-10-CM | POA: Diagnosis not present

## 2015-06-05 LAB — POCT HEMOGLOBIN-HEMACUE: Hemoglobin: 9 g/dL — ABNORMAL LOW (ref 12.0–15.0)

## 2015-06-05 MED ORDER — DARBEPOETIN ALFA 60 MCG/0.3ML IJ SOSY
60.0000 ug | PREFILLED_SYRINGE | INTRAMUSCULAR | Status: DC
  Administered 2015-06-05: 60 ug via SUBCUTANEOUS

## 2015-06-05 MED ORDER — DARBEPOETIN ALFA 60 MCG/0.3ML IJ SOSY
PREFILLED_SYRINGE | INTRAMUSCULAR | Status: AC
  Administered 2015-06-05: 60 ug via SUBCUTANEOUS
  Filled 2015-06-05: qty 0.3

## 2015-06-05 MED ORDER — IOHEXOL 300 MG/ML  SOLN
25.0000 mL | Freq: Once | INTRAMUSCULAR | Status: AC | PRN
  Administered 2015-06-05: 25 mL

## 2015-06-05 MED ORDER — LIDOCAINE HCL 1 % IJ SOLN
INTRAMUSCULAR | Status: AC
  Filled 2015-06-05: qty 20

## 2015-06-12 ENCOUNTER — Encounter (HOSPITAL_COMMUNITY)
Admission: RE | Admit: 2015-06-12 | Discharge: 2015-06-12 | Disposition: A | Discharge status: Home / Self Care | Payer: Medicare Other | Source: Ambulatory Visit | Attending: Nephrology | Admitting: Nephrology

## 2015-06-12 DIAGNOSIS — N184 Chronic kidney disease, stage 4 (severe): Secondary | ICD-10-CM | POA: Diagnosis not present

## 2015-06-12 MED ORDER — DARBEPOETIN ALFA 60 MCG/0.3ML IJ SOSY
PREFILLED_SYRINGE | INTRAMUSCULAR | Status: AC
  Administered 2015-06-12: 60 ug via SUBCUTANEOUS
  Filled 2015-06-12: qty 0.3

## 2015-06-12 MED ORDER — DARBEPOETIN ALFA 60 MCG/0.3ML IJ SOSY
60.0000 ug | PREFILLED_SYRINGE | INTRAMUSCULAR | Status: DC
  Administered 2015-06-12: 60 ug via SUBCUTANEOUS

## 2015-06-16 LAB — POCT HEMOGLOBIN-HEMACUE: Hemoglobin: 10.7 g/dL — ABNORMAL LOW (ref 12.0–15.0)

## 2015-06-19 ENCOUNTER — Encounter (HOSPITAL_COMMUNITY)
Admission: RE | Admit: 2015-06-19 | Discharge: 2015-06-19 | Disposition: A | Discharge status: Home / Self Care | Payer: Medicare Other | Source: Ambulatory Visit | Attending: Nephrology | Admitting: Nephrology

## 2015-06-19 DIAGNOSIS — N184 Chronic kidney disease, stage 4 (severe): Secondary | ICD-10-CM | POA: Diagnosis not present

## 2015-06-19 LAB — RENAL FUNCTION PANEL
Albumin: 2.6 g/dL — ABNORMAL LOW (ref 3.5–5.0)
Anion gap: 10 (ref 5–15)
BUN: 59 mg/dL — ABNORMAL HIGH (ref 6–20)
CO2: 22 mmol/L (ref 22–32)
Calcium: 9.3 mg/dL (ref 8.9–10.3)
Chloride: 106 mmol/L (ref 101–111)
Creatinine, Ser: 4.87 mg/dL — ABNORMAL HIGH (ref 0.44–1.00)
GFR calc Af Amer: 10 mL/min — ABNORMAL LOW (ref >60)
GFR calc non Af Amer: 8 mL/min — ABNORMAL LOW (ref >60)
Glucose, Bld: 98 mg/dL (ref 65–99)
Phosphorus: 5.7 mg/dL — ABNORMAL HIGH (ref 2.5–4.6)
Potassium: 4.4 mmol/L (ref 3.5–5.1)
Sodium: 138 mmol/L (ref 135–145)

## 2015-06-19 MED ORDER — DARBEPOETIN ALFA 60 MCG/0.3ML IJ SOSY
PREFILLED_SYRINGE | INTRAMUSCULAR | Status: AC
  Filled 2015-06-19: qty 0.3

## 2015-06-19 MED ORDER — DARBEPOETIN ALFA 60 MCG/0.3ML IJ SOSY
60.0000 ug | PREFILLED_SYRINGE | INTRAMUSCULAR | Status: DC
  Administered 2015-06-19: 60 ug via SUBCUTANEOUS

## 2015-06-23 LAB — POCT HEMOGLOBIN-HEMACUE: Hemoglobin: 10.8 g/dL — ABNORMAL LOW (ref 12.0–15.0)

## 2015-06-26 ENCOUNTER — Encounter (HOSPITAL_COMMUNITY)
Admission: RE | Admit: 2015-06-26 | Discharge: 2015-06-26 | Disposition: A | Discharge status: Home / Self Care | Payer: Medicare Other | Source: Ambulatory Visit | Attending: Nephrology | Admitting: Nephrology

## 2015-06-26 DIAGNOSIS — Z5181 Encounter for therapeutic drug level monitoring: Secondary | ICD-10-CM | POA: Diagnosis not present

## 2015-06-26 DIAGNOSIS — Z79899 Other long term (current) drug therapy: Secondary | ICD-10-CM | POA: Diagnosis not present

## 2015-06-26 DIAGNOSIS — N184 Chronic kidney disease, stage 4 (severe): Secondary | ICD-10-CM | POA: Insufficient documentation

## 2015-06-26 DIAGNOSIS — D631 Anemia in chronic kidney disease: Secondary | ICD-10-CM | POA: Diagnosis not present

## 2015-06-26 LAB — IRON AND TIBC
Iron: 36 ug/dL (ref 28–170)
Saturation Ratios: 14 % (ref 10.4–31.8)
TIBC: 258 ug/dL (ref 250–450)
UIBC: 222 ug/dL

## 2015-06-26 LAB — FERRITIN: Ferritin: 79 ng/mL (ref 11–307)

## 2015-06-26 LAB — POCT HEMOGLOBIN-HEMACUE: Hemoglobin: 11.8 g/dL — ABNORMAL LOW (ref 12.0–15.0)

## 2015-06-26 MED ORDER — DARBEPOETIN ALFA 60 MCG/0.3ML IJ SOSY
PREFILLED_SYRINGE | INTRAMUSCULAR | Status: AC
  Administered 2015-06-26: 60 ug via SUBCUTANEOUS
  Filled 2015-06-26: qty 0.3

## 2015-06-26 MED ORDER — DARBEPOETIN ALFA 60 MCG/0.3ML IJ SOSY
60.0000 ug | PREFILLED_SYRINGE | INTRAMUSCULAR | Status: DC
  Administered 2015-06-26: 60 ug via SUBCUTANEOUS

## 2015-06-29 ENCOUNTER — Other Ambulatory Visit (HOSPITAL_COMMUNITY): Payer: Medicare Other

## 2015-07-01 ENCOUNTER — Other Ambulatory Visit (HOSPITAL_COMMUNITY): Payer: Self-pay | Admitting: *Deleted

## 2015-07-02 ENCOUNTER — Encounter (HOSPITAL_COMMUNITY)
Admission: RE | Admit: 2015-07-02 | Discharge: 2015-07-02 | Disposition: A | Discharge status: Home / Self Care | Payer: Medicare Other | Source: Ambulatory Visit | Attending: Nephrology | Admitting: Nephrology

## 2015-07-02 DIAGNOSIS — N184 Chronic kidney disease, stage 4 (severe): Secondary | ICD-10-CM | POA: Diagnosis not present

## 2015-07-02 LAB — POCT HEMOGLOBIN-HEMACUE: Hemoglobin: 12.5 g/dL (ref 12.0–15.0)

## 2015-07-02 MED ORDER — SODIUM CHLORIDE 0.9 % IV SOLN
510.0000 mg | Freq: Once | INTRAVENOUS | Status: AC
  Administered 2015-07-02: 510 mg via INTRAVENOUS
  Filled 2015-07-02: qty 17

## 2015-07-02 MED ORDER — DARBEPOETIN ALFA 60 MCG/0.3ML IJ SOSY: 60.0000 ug | PREFILLED_SYRINGE | INTRAMUSCULAR | Status: DC

## 2015-07-03 ENCOUNTER — Emergency Department (HOSPITAL_COMMUNITY): Payer: Medicare Other

## 2015-07-03 ENCOUNTER — Inpatient Hospital Stay (HOSPITAL_COMMUNITY)
Admission: EM | Admit: 2015-07-03 | Discharge: 2015-07-08 | DRG: 872 | Disposition: A | Discharge status: Home / Self Care | Payer: Medicare Other | Attending: Internal Medicine | Admitting: Internal Medicine

## 2015-07-03 ENCOUNTER — Ambulatory Visit (HOSPITAL_COMMUNITY)
Admission: RE | Admit: 2015-07-03 | Discharge: 2015-07-03 | Disposition: A | Discharge status: Home / Self Care | Payer: Medicare Other | Source: Ambulatory Visit | Attending: Radiology | Admitting: Radiology

## 2015-07-03 ENCOUNTER — Encounter (HOSPITAL_COMMUNITY): Payer: Self-pay | Admitting: Emergency Medicine

## 2015-07-03 DIAGNOSIS — E872 Acidosis: Secondary | ICD-10-CM | POA: Diagnosis present

## 2015-07-03 DIAGNOSIS — R319 Hematuria, unspecified: Secondary | ICD-10-CM | POA: Diagnosis present

## 2015-07-03 DIAGNOSIS — Z66 Do not resuscitate: Secondary | ICD-10-CM | POA: Diagnosis present

## 2015-07-03 DIAGNOSIS — N132 Hydronephrosis with renal and ureteral calculous obstruction: Secondary | ICD-10-CM | POA: Diagnosis present

## 2015-07-03 DIAGNOSIS — K219 Gastro-esophageal reflux disease without esophagitis: Secondary | ICD-10-CM | POA: Diagnosis present

## 2015-07-03 DIAGNOSIS — Z436 Encounter for attention to other artificial openings of urinary tract: Secondary | ICD-10-CM | POA: Insufficient documentation

## 2015-07-03 DIAGNOSIS — N1339 Other hydronephrosis: Secondary | ICD-10-CM | POA: Insufficient documentation

## 2015-07-03 DIAGNOSIS — Z87442 Personal history of urinary calculi: Secondary | ICD-10-CM

## 2015-07-03 DIAGNOSIS — Z833 Family history of diabetes mellitus: Secondary | ICD-10-CM

## 2015-07-03 DIAGNOSIS — I120 Hypertensive chronic kidney disease with stage 5 chronic kidney disease or end stage renal disease: Secondary | ICD-10-CM | POA: Diagnosis present

## 2015-07-03 DIAGNOSIS — Z8614 Personal history of Methicillin resistant Staphylococcus aureus infection: Secondary | ICD-10-CM

## 2015-07-03 DIAGNOSIS — N136 Pyonephrosis: Secondary | ICD-10-CM | POA: Diagnosis present

## 2015-07-03 DIAGNOSIS — Z936 Other artificial openings of urinary tract status: Secondary | ICD-10-CM

## 2015-07-03 DIAGNOSIS — N1330 Unspecified hydronephrosis: Secondary | ICD-10-CM

## 2015-07-03 DIAGNOSIS — I252 Old myocardial infarction: Secondary | ICD-10-CM

## 2015-07-03 DIAGNOSIS — I10 Essential (primary) hypertension: Secondary | ICD-10-CM | POA: Diagnosis present

## 2015-07-03 DIAGNOSIS — Z79899 Other long term (current) drug therapy: Secondary | ICD-10-CM

## 2015-07-03 DIAGNOSIS — A419 Sepsis, unspecified organism: Principal | ICD-10-CM | POA: Diagnosis present

## 2015-07-03 DIAGNOSIS — E861 Hypovolemia: Secondary | ICD-10-CM | POA: Diagnosis present

## 2015-07-03 DIAGNOSIS — N185 Chronic kidney disease, stage 5: Secondary | ICD-10-CM | POA: Diagnosis present

## 2015-07-03 DIAGNOSIS — Z8249 Family history of ischemic heart disease and other diseases of the circulatory system: Secondary | ICD-10-CM

## 2015-07-03 DIAGNOSIS — I5032 Chronic diastolic (congestive) heart failure: Secondary | ICD-10-CM | POA: Diagnosis present

## 2015-07-03 DIAGNOSIS — N200 Calculus of kidney: Secondary | ICD-10-CM

## 2015-07-03 DIAGNOSIS — I2510 Atherosclerotic heart disease of native coronary artery without angina pectoris: Secondary | ICD-10-CM | POA: Diagnosis present

## 2015-07-03 DIAGNOSIS — A0472 Enterocolitis due to Clostridium difficile, not specified as recurrent: Secondary | ICD-10-CM | POA: Diagnosis not present

## 2015-07-03 DIAGNOSIS — A047 Enterocolitis due to Clostridium difficile: Secondary | ICD-10-CM | POA: Diagnosis present

## 2015-07-03 DIAGNOSIS — N10 Acute tubulo-interstitial nephritis: Secondary | ICD-10-CM | POA: Diagnosis present

## 2015-07-03 MED ORDER — IOHEXOL 300 MG/ML  SOLN
10.0000 mL | Freq: Once | INTRAMUSCULAR | Status: AC | PRN
  Administered 2015-07-03: 10 mL

## 2015-07-03 MED ORDER — LIDOCAINE HCL 1 % IJ SOLN
INTRAMUSCULAR | Status: AC
  Filled 2015-07-03: qty 20

## 2015-07-03 NOTE — ED Notes (Signed)
Patient reports home temperature of 101.4, nephrostomy was changed today with blood in urine, nausea x1 day. Denies vomiting, diarrhea, dizziness/lightheadness

## 2015-07-03 NOTE — ED Provider Notes (Signed)
CSN: [id number]     Arrival date & time 07/03/15  2259 History  By signing my name below, I, Irene Pap, attest that this documentation has been prepared under the direction and in the presence of Leo Grosser, MD. Electronically Signed: Irene Pap, ED Scribe. 07/03/2015. 3:27 AM.  Chief Complaint  Patient presents with  . Nausea  . Hematuria   Patient is a 70 y.o. female presenting with hematuria. The history is provided by the patient. No language interpreter was used.  Hematuria This is a new problem. The current episode started 6 to 12 hours ago. The problem occurs constantly. The problem has not changed since onset.Pertinent negatives include no chest pain and no abdominal pain. She has tried nothing for the symptoms.  HPI Comments: [name] is a 70 y.o. Female with a hx of renal calculi, dysrhythmia, pneumonia, MI, A-Fib, CHF, sigmoid diverticulosis, nephrolithiasis, CKD, septic shock, HTN, PVD, nephrostomy and catheter placement who presents to the Emergency Department complaining of hematuria onset 3 hours ago. Pt states that she had her nephrostomy changed today and noticed blood in her urine. She reports associated nausea, chills, and a fever tmax 101.4 F today. She states that she woke up from a nap with confusion, but states that this is typical of her when "I have a problem." She denies a hx of similar symptoms or recent anti-biotic use since 05/21/15. She denies abdominal pain, vomiting, diarrhea, dizziness, or lightheadedness. Pt reports diarrhea when she takes anti-biotics.   Past Medical History  Diagnosis Date  . GERD (gastroesophageal reflux disease)   . Glaucoma   . H/O renal calculi 2002 & 2006  . H/O hiatal hernia   . Headache(784.0)     migraine  . Pneumonia     dx 10-06-2014 per CXR--  on 10-27-2014 pt states finished antibiotic and denies cough or fever  . Hyperlipidemia   . History of MI (myocardial infarction)     10/ 2013 in setting of Septic Shock   . History of atrial fibrillation     10/ 2013  in setting of Septic Shock  . History of CHF (congestive heart failure)     10/ 2013 in setting of septic shock  . Sigmoid diverticulosis   . Nephrolithiasis     bilateral  . CKD (chronic kidney disease), stage III     nephrologist-  dr Florene Glen  . Right ureteral stone   . History of acute respiratory failure     10/ 2013  -- ventilated in setting septic shock  . Dysrhythmia     Afib in 2013 when she had septic shock related to stone ureteral obstruction  . Myocardial infarction [facility])     Was reported in 2013 during hospitalization with septic shock  . Septic shock ([place]) 04/04/2012  . Sepsis ([place])   . Hypertension     medication removed from regimen due to low blood pressure   . Peripheral vascular disease ([place])   . S/P hemodialysis catheter insertion ([place]) 04/11/2015     right anterior chest , only used once   . History of nephrostomy 04/11/2015     currently inplace 04/28/2015   . History of blood transfusion 04/13/2015   . Low iron     hx  . Glaucoma   . Complication of anesthesia     use a little anesthesia , per patient MD states she quit breathing    Past Surgical History  Procedure Laterality Date  . Cystoscopy w/ ureteral stent  placement  04/04/2012    Procedure: CYSTOSCOPY WITH RETROGRADE PYELOGRAM/URETERAL STENT PLACEMENT;  Surgeon: Ailene Rud, MD;  Location: [place];  Service: Urology;  Laterality: Left;  . Total abdominal hysterectomy w/ bilateral salpingoophorectomy  1993    secondary to fibroids  . Breast biopsy Left 08/23/07    benign fibrocystic with duct ectasia  . Cardiac catheterization  07-11-2012  dr Irish Lack    Abnormal stress test/   normal coronary arteries/  LVEDP  60mmHg  . Cardiovascular stress test  06-26-2012  dr Irish Lack    marked ischemia in the basal anterior, mid anterior, apical inferior regions/  normal LVF, ef 63%  . Transthoracic echocardiogram  04-09-2012    normal LVF,  ef 60-65%,   mild LAE,  mild TR, trivial MR and PR  . Cataract extraction w/ intraocular lens  implant, bilateral    . Knee arthroscopy Left 02-14-2003  . Laparoscopic cholecystectomy  03-23-2005  . Extracorporeal shock wave lithotripsy  05-28-2012  &  10-08-2012  . Cystoscopy with stent placement Right 10/28/2014    Procedure: RIGHT URETERAL STENT PLACEMENT;  Surgeon: Irine Seal, MD;  Location: [facility];  Service: Urology;  Laterality: Right;  . Cystoscopy/retrograde/ureteroscopy/stone extraction with basket Right 11/21/2014    Procedure: CYSTOSCOPY/RIGHT RETROGRADE PYELOGRAM/RIGHT URETEROSCOPY/BASKET EXTRACTION/RIGHT PYELOSCOPY/LASER OF STONE/RIGHT DOUBLE J STENT;  Surgeon: Carolan Clines, MD;  Location: [place];  Service: Urology;  Laterality: Right;  . Holmium laser application Right [id number]    Procedure: HOLMIUM LASER APPLICATION;  Surgeon: Carolan Clines, MD;  Location: [facility];  Service: Urology;  Laterality: Right;  . Cystoscopy with stent placement Right 02/26/2015    Procedure: CYSTOSCOPY RETROGRADE PYELOGRAM WITH STENT PLACEMENT;  Surgeon: Cleon Gustin, MD;  Location: [facility];  Service: Urology;  Laterality: Right;  . Cystoscopy w/ ureteral stent placement Bilateral 05/04/2015    Procedure: CYSTOSCOPY WITH BILATERAL RETROGRADE PYELOGRAM/ WITH INTERPRETATION, EXCHANGE OF RIGHT URETERAL STENT REPLACEMENT AND PLACEMENT LEFT URETERAL STENT PLACEMENT EXAMINATION OF VAGINA;  Surgeon: Carolan Clines, MD;  Location: [facility];  Service: Urology;  Laterality: Bilateral;  . Av fistula placement Left 06/02/2015    Procedure: BRACHIOCEPHALIC ARTERIOVENOUS (AV) FISTULA CREATION ;  Surgeon: Conrad , MD;  Location: [facility];  Service: Vascular;  Laterality: Left;   Family History  Problem Relation Age of Onset  . Hypertension Mother   . Cancer Mother 21    breast  . Dementia Mother   . Hypertension Brother   . Diabetes Brother   . Cancer  Father 12    pancreatic  . Heart failure Paternal Grandmother   . Bladder Cancer Maternal Grandfather    Social History  Substance Use Topics  . Smoking status: Never Smoker   . Smokeless tobacco: Never Used  . Alcohol Use: No   OB History    Gravida Para Term Preterm AB TAB SAB Ectopic Multiple Living   1 1 1       1      Review of Systems  Constitutional: Positive for fever and chills.  Cardiovascular: Negative for chest pain.  Gastrointestinal: Positive for nausea. Negative for vomiting, abdominal pain and diarrhea.  Genitourinary: Positive for hematuria.  Neurological: Negative for dizziness and light-headedness.  Psychiatric/Behavioral: Positive for confusion.  All other systems reviewed and are negative.  Allergies  Vicodin; Chlorhexidine; and Percocet  Home Medications   Prior to Admission medications   Medication Sig Start Date End Date Taking? Authorizing Provider  acetaminophen (TYLENOL) 500  MG tablet Take 500 mg by mouth every 6 (six) hours as needed for moderate pain.    Historical Provider, MD  acidophilus (RISAQUAD) CAPS capsule Take 2 capsules by mouth daily. 05/07/15   Maryann Mikhail, DO  bimatoprost (LUMIGAN) 0.01 % SOLN Place 1 drop into both eyes at bedtime.    Historical Provider, MD  dextromethorphan (DELSYM) 30 MG/5ML liquid Take 2.5 mLs (15 mg total) by mouth every 8 (eight) hours as needed for cough. 05/30/15   Robbie Lis, MD  diphenoxylate-atropine (LOMOTIL) 2.5-0.025 MG/5ML liquid Take 10 mLs by mouth 4 (four) times daily as needed for diarrhea or loose stools. 05/30/15   Robbie Lis, MD  ENSURE PLUS (ENSURE PLUS) LIQD Take 237 mLs by mouth daily as needed (nutritional supplement).     Historical Provider, MD  HYDROcodone-acetaminophen (NORCO/VICODIN) 5-325 MG tablet Take 1 tablet by mouth every 4 (four) hours as needed for moderate pain. 06/02/15   Ulyses Amor, PA-C  Nutritional Supplements (FEEDING SUPPLEMENT, NEPRO CARB STEADY,) LIQD Take  237 mLs by mouth daily. 05/07/15   Maryann Mikhail, DO  ondansetron (ZOFRAN) 4 MG tablet Take 1 tablet (4 mg total) by mouth every 6 (six) hours as needed for nausea. 05/30/15   Robbie Lis, MD  ranitidine (ZANTAC) 150 MG tablet Take 150 mg by mouth 2 (two) times daily as needed for heartburn.    Historical Provider, MD  saccharomyces boulardii (FLORASTOR) 250 MG capsule Take 1 capsule (250 mg total) by mouth 2 (two) times daily. 05/30/15   Robbie Lis, MD  sodium bicarbonate 650 MG tablet Take 1 tablet (650 mg total) by mouth 2 (two) times daily. 05/30/15   Robbie Lis, MD  sodium chloride (OCEAN) 0.65 % SOLN nasal spray Place 1 spray into both nostrils 3 (three) times daily as needed for congestion.    Historical Provider, MD  timolol (BETIMOL) 0.5 % ophthalmic solution Place 1 drop into both eyes every morning.     Historical Provider, MD   BP 126/64 mmHg  Pulse 118  Temp(Src) 97.9 F (36.6 C) (Oral)  Resp 22  SpO2 97%  LMP 01/19/1992 Physical Exam  Constitutional: She is oriented to person, place, and time. She appears well-developed and well-nourished. She has a sickly appearance. No distress.  HENT:  Head: Normocephalic and atraumatic.  Mouth/Throat: Oropharynx is clear and moist. No oropharyngeal exudate.  Trachea midline  Eyes: Conjunctivae and EOM are normal. Pupils are equal, round, and reactive to light.  Neck: Trachea normal and normal range of motion. Neck supple. Carotid bruit is not present.  Cardiovascular: Regular rhythm.  Tachycardia present.  Exam reveals no gallop and no friction rub.   No murmur heard. Pulmonary/Chest: Effort normal and breath sounds normal. She has no wheezes. She has no rales.  Abdominal: Soft. There is no tenderness. There is no rebound and no guarding.  Musculoskeletal: Normal range of motion.  Neurological: She is alert and oriented to person, place, and time. She has normal reflexes. No cranial nerve deficit. She exhibits normal muscle  tone. Coordination normal.  Cranial nerves 2-12 intact  Skin: Skin is warm and dry. There is pallor.  Psychiatric: She has a normal mood and affect. Her behavior is normal.    ED Course  Procedures (including critical care time)  CRITICAL CARE Performed by: Leo Grosser Total critical care time: 30 minutes Critical care time was exclusive of separately billable procedures and treating other patients. Critical care was necessary to treat or  prevent imminent or life-threatening deterioration. Critical care was time spent personally by me on the following activities: development of treatment plan with patient and/or surrogate as well as nursing, discussions with consultants, evaluation of patient's response to treatment, examination of patient, obtaining history from patient or surrogate, ordering and performing treatments and interventions, ordering and review of laboratory studies, ordering and review of radiographic studies, pulse oximetry and re-evaluation of patient's condition.   DIAGNOSTIC STUDIES: Oxygen Saturation is 97% on RA, normal by my interpretation.    COORDINATION OF CARE: 12:24 AM-Discussed treatment plan which includes IV fluids, labs and x-ray with pt at bedside and pt agreed to plan.    Labs Review Labs Reviewed  COMPREHENSIVE METABOLIC PANEL - Abnormal; Notable for the following:    Sodium 133 (*)    CO2 14 (*)    Glucose, Bld 163 (*)    BUN 64 (*)    Creatinine, Ser 5.12 (*)    Albumin 3.4 (*)    ALT 13 (*)    GFR calc non Af Amer 8 (*)    GFR calc Af Amer 9 (*)    All other components within normal limits  CBC WITH DIFFERENTIAL/PLATELET - Abnormal; Notable for the following:    WBC 18.4 (*)    RDW 15.7 (*)    Neutro Abs 17.4 (*)    Lymphs Abs 0.5 (*)    All other components within normal limits  URINE CULTURE  CULTURE, BLOOD (ROUTINE X 2)  CULTURE, BLOOD (ROUTINE X 2)  C DIFFICILE QUICK SCREEN W PCR REFLEX  URINALYSIS, ROUTINE W REFLEX  MICROSCOPIC (NOT AT [facility])  BRAIN NATRIURETIC PEPTIDE  LIPID PANEL  LACTIC ACID, PLASMA  LACTIC ACID, PLASMA  PROCALCITONIN  PROTIME-INR  APTT  BASIC METABOLIC PANEL  CBC  I-STAT CG4 LACTIC ACID, ED  I-STAT CG4 LACTIC ACID, ED    Imaging Review Dg Chest 2 View  07/04/2015  CLINICAL DATA:  70 year old female with fever. New nephrostomy tube placement. EXAM: CHEST  2 VIEW COMPARISON:  Radiograph dated 05/23/2015 FINDINGS: The right-sided dialysis catheter with tip at the cavoatrial junction. There is no focal consolidation, pleural effusion, or pneumothorax. There are bilateral central and hilar vascular prominence. Retrocardiac air correspond to the hiatal hernia seen on the CT. The cardiac silhouette is within normal limits. The osseous structures appear unremarkable. IMPRESSION: No focal consolidation. Electronically Signed   By: Anner Crete M.D.   On: 07/04/2015 00:53   Ir Nephrostomy Exchange Right  07/03/2015  CLINICAL DATA:  CHRONIC RIGHT HYDRONEPHROSIS, CHRONIC INDWELLING EXTERNAL RIGHT NEPHROSTOMY, BILATERAL INTERNAL URETERAL STENTS EXAM: FLUOROSCOPIC RIGHT 10 FRENCH NEPHROSTOMY EXCHANGE Date:  1/13/20171/13/2017 11:58 am Radiologist:  M. Daryll Brod, MD Guidance:  Fluoroscopic FLUOROSCOPY TIME:  1.4 minutes, 18.6 mGy MEDICATIONS AND MEDICAL HISTORY: 1% lidocaine local ANESTHESIA/SEDATION: None. CONTRAST:  63mL OMNIPAQUE IOHEXOL 300 MG/ML  SOLN COMPLICATIONS: None immediate PROCEDURE: Informed consent was obtained from the patient following explanation of the procedure, risks, benefits and alternatives. The patient understands, agrees and consents for the procedure. All questions were addressed. A time out was performed. Maximal barrier sterile technique utilized including caps, mask, sterile gowns, sterile gloves, large sterile drape, hand hygiene, and Betadine. Under sterile conditions and local anesthesia, the existing right nephrostomy catheter was injected with contrast confirming  position in the renal pelvis. Stable ureteral stents. Nephrostomy catheter was cut and removed over a Bentson guidewire. New 10 French nephrostomy was advanced with the retention loop formed in the renal pelvis. Position confirmed with  fluoroscopy. Images obtained for documentation. Catheter secured with a Prolene suture and connected to external gravity drainage. Sterile dressing applied. No immediate complication. IMPRESSION: Successful fluoroscopic exchange of the 10 French right nephrostomy Electronically Signed   By: Jerilynn Mages.  Shick M.D.   On: 07/03/2015 14:17   I have personally reviewed and evaluated these images and lab results as part of my medical decision-making.   EKG Interpretation None      MDM   Final diagnoses:  Sepsis, due to unspecified organism [facility])    71 y.o. female presents with fever, chills, tachycardia and nausea since having nephrostomy tube changed. Patient meets clinical criteria for sepsis with at least 2 SIRS and suspected source of nephrostomy manipulation. Fluid rescusitation with multiple IVF boluses, last available urine culture was vancomycin sensitive MRSA, started on empiric vanc/zosyn. Hospitalist was consulted for admission and will see the patient in the emergency department.      I personally performed the services described in this documentation, which was scribed in my presence. The recorded information has been reviewed and is accurate.      Leo Grosser, MD 07/04/15 [phone]

## 2015-07-03 NOTE — Procedures (Signed)
Successful RT PCN EXCHG NO COMP STABLE KEEP TO GRAVITY DRAINAGE

## 2015-07-04 ENCOUNTER — Emergency Department (HOSPITAL_COMMUNITY): Payer: Medicare Other

## 2015-07-04 DIAGNOSIS — I10 Essential (primary) hypertension: Secondary | ICD-10-CM | POA: Diagnosis not present

## 2015-07-04 DIAGNOSIS — N1330 Unspecified hydronephrosis: Secondary | ICD-10-CM | POA: Diagnosis not present

## 2015-07-04 DIAGNOSIS — A419 Sepsis, unspecified organism: Principal | ICD-10-CM

## 2015-07-04 DIAGNOSIS — Z936 Other artificial openings of urinary tract status: Secondary | ICD-10-CM | POA: Diagnosis not present

## 2015-07-04 DIAGNOSIS — N132 Hydronephrosis with renal and ureteral calculous obstruction: Secondary | ICD-10-CM

## 2015-07-04 DIAGNOSIS — I5032 Chronic diastolic (congestive) heart failure: Secondary | ICD-10-CM | POA: Diagnosis present

## 2015-07-04 DIAGNOSIS — N10 Acute tubulo-interstitial nephritis: Secondary | ICD-10-CM | POA: Diagnosis not present

## 2015-07-04 DIAGNOSIS — I252 Old myocardial infarction: Secondary | ICD-10-CM | POA: Diagnosis not present

## 2015-07-04 DIAGNOSIS — I120 Hypertensive chronic kidney disease with stage 5 chronic kidney disease or end stage renal disease: Secondary | ICD-10-CM | POA: Diagnosis present

## 2015-07-04 DIAGNOSIS — N185 Chronic kidney disease, stage 5: Secondary | ICD-10-CM | POA: Diagnosis present

## 2015-07-04 DIAGNOSIS — Z79899 Other long term (current) drug therapy: Secondary | ICD-10-CM | POA: Diagnosis not present

## 2015-07-04 DIAGNOSIS — I2510 Atherosclerotic heart disease of native coronary artery without angina pectoris: Secondary | ICD-10-CM | POA: Diagnosis present

## 2015-07-04 DIAGNOSIS — R319 Hematuria, unspecified: Secondary | ICD-10-CM | POA: Diagnosis present

## 2015-07-04 DIAGNOSIS — Z8614 Personal history of Methicillin resistant Staphylococcus aureus infection: Secondary | ICD-10-CM | POA: Diagnosis not present

## 2015-07-04 DIAGNOSIS — E861 Hypovolemia: Secondary | ICD-10-CM | POA: Diagnosis present

## 2015-07-04 DIAGNOSIS — Z833 Family history of diabetes mellitus: Secondary | ICD-10-CM | POA: Diagnosis not present

## 2015-07-04 DIAGNOSIS — Z87442 Personal history of urinary calculi: Secondary | ICD-10-CM | POA: Diagnosis not present

## 2015-07-04 DIAGNOSIS — Z8249 Family history of ischemic heart disease and other diseases of the circulatory system: Secondary | ICD-10-CM | POA: Diagnosis not present

## 2015-07-04 DIAGNOSIS — Z66 Do not resuscitate: Secondary | ICD-10-CM | POA: Diagnosis present

## 2015-07-04 DIAGNOSIS — A047 Enterocolitis due to Clostridium difficile: Secondary | ICD-10-CM | POA: Diagnosis present

## 2015-07-04 DIAGNOSIS — N136 Pyonephrosis: Secondary | ICD-10-CM | POA: Diagnosis present

## 2015-07-04 DIAGNOSIS — K219 Gastro-esophageal reflux disease without esophagitis: Secondary | ICD-10-CM | POA: Diagnosis present

## 2015-07-04 DIAGNOSIS — E872 Acidosis: Secondary | ICD-10-CM | POA: Diagnosis present

## 2015-07-04 LAB — CBC WITH DIFFERENTIAL/PLATELET
Basophils Absolute: 0 10*3/uL (ref 0.0–0.1)
Basophils Relative: 0 %
Eosinophils Absolute: 0 10*3/uL (ref 0.0–0.7)
Eosinophils Relative: 0 %
HCT: 43.2 % (ref 36.0–46.0)
Hemoglobin: 13.7 g/dL (ref 12.0–15.0)
Lymphocytes Relative: 2 %
Lymphs Abs: 0.5 10*3/uL — ABNORMAL LOW (ref 0.7–4.0)
MCH: 27.5 pg (ref 26.0–34.0)
MCHC: 31.7 g/dL (ref 30.0–36.0)
MCV: 86.6 fL (ref 78.0–100.0)
Monocytes Absolute: 0.5 10*3/uL (ref 0.1–1.0)
Monocytes Relative: 3 %
Neutro Abs: 17.4 10*3/uL — ABNORMAL HIGH (ref 1.7–7.7)
Neutrophils Relative %: 95 %
Platelets: 252 10*3/uL (ref 150–400)
RBC: 4.99 MIL/uL (ref 3.87–5.11)
RDW: 15.7 % — ABNORMAL HIGH (ref 11.5–15.5)
WBC: 18.4 10*3/uL — ABNORMAL HIGH (ref 4.0–10.5)

## 2015-07-04 LAB — COMPREHENSIVE METABOLIC PANEL
ALT: 13 U/L — ABNORMAL LOW (ref 14–54)
AST: 18 U/L (ref 15–41)
Albumin: 3.4 g/dL — ABNORMAL LOW (ref 3.5–5.0)
Alkaline Phosphatase: 88 U/L (ref 38–126)
Anion gap: 15 (ref 5–15)
BUN: 64 mg/dL — ABNORMAL HIGH (ref 6–20)
CO2: 14 mmol/L — ABNORMAL LOW (ref 22–32)
Calcium: 8.9 mg/dL (ref 8.9–10.3)
Chloride: 104 mmol/L (ref 101–111)
Creatinine, Ser: 5.12 mg/dL — ABNORMAL HIGH (ref 0.44–1.00)
GFR calc Af Amer: 9 mL/min — ABNORMAL LOW (ref >60)
GFR calc non Af Amer: 8 mL/min — ABNORMAL LOW (ref >60)
Glucose, Bld: 163 mg/dL — ABNORMAL HIGH (ref 65–99)
Potassium: 4.6 mmol/L (ref 3.5–5.1)
Sodium: 133 mmol/L — ABNORMAL LOW (ref 135–145)
Total Bilirubin: 0.6 mg/dL (ref 0.3–1.2)
Total Protein: 7.1 g/dL (ref 6.5–8.1)

## 2015-07-04 LAB — BASIC METABOLIC PANEL
Anion gap: 10 (ref 5–15)
BUN: 57 mg/dL — ABNORMAL HIGH (ref 6–20)
CO2: 13 mmol/L — ABNORMAL LOW (ref 22–32)
Calcium: 7.8 mg/dL — ABNORMAL LOW (ref 8.9–10.3)
Chloride: 114 mmol/L — ABNORMAL HIGH (ref 101–111)
Creatinine, Ser: 4.71 mg/dL — ABNORMAL HIGH (ref 0.44–1.00)
GFR calc Af Amer: 10 mL/min — ABNORMAL LOW (ref >60)
GFR calc non Af Amer: 9 mL/min — ABNORMAL LOW (ref >60)
Glucose, Bld: 111 mg/dL — ABNORMAL HIGH (ref 65–99)
Potassium: 4 mmol/L (ref 3.5–5.1)
Sodium: 137 mmol/L (ref 135–145)

## 2015-07-04 LAB — URINALYSIS, ROUTINE W REFLEX MICROSCOPIC
Bilirubin Urine: NEGATIVE
Glucose, UA: NEGATIVE mg/dL
Ketones, ur: NEGATIVE mg/dL
Nitrite: NEGATIVE
Protein, ur: 100 mg/dL — AB
Specific Gravity, Urine: 1.012 (ref 1.005–1.030)
pH: 5.5 (ref 5.0–8.0)

## 2015-07-04 LAB — PROCALCITONIN: Procalcitonin: 4.7 ng/mL

## 2015-07-04 LAB — LIPID PANEL
Cholesterol: 123 mg/dL (ref 0–200)
HDL: 43 mg/dL (ref >40)
LDL Cholesterol: 63 mg/dL (ref 0–99)
Total CHOL/HDL Ratio: 2.9 RATIO
Triglycerides: 84 mg/dL (ref <150)
VLDL: 17 mg/dL (ref 0–40)

## 2015-07-04 LAB — APTT: aPTT: 32 seconds (ref 24–37)

## 2015-07-04 LAB — CBC
HCT: 34 % — ABNORMAL LOW (ref 36.0–46.0)
Hemoglobin: 10.5 g/dL — ABNORMAL LOW (ref 12.0–15.0)
MCH: 27.4 pg (ref 26.0–34.0)
MCHC: 30.9 g/dL (ref 30.0–36.0)
MCV: 88.8 fL (ref 78.0–100.0)
Platelets: 208 10*3/uL (ref 150–400)
RBC: 3.83 MIL/uL — ABNORMAL LOW (ref 3.87–5.11)
RDW: 16 % — ABNORMAL HIGH (ref 11.5–15.5)
WBC: 16.9 10*3/uL — ABNORMAL HIGH (ref 4.0–10.5)

## 2015-07-04 LAB — CBG MONITORING, ED: Glucose-Capillary: 122 mg/dL — ABNORMAL HIGH (ref 65–99)

## 2015-07-04 LAB — URINE MICROSCOPIC-ADD ON

## 2015-07-04 LAB — CORTISOL: Cortisol, Plasma: 31.8 ug/dL

## 2015-07-04 LAB — LACTIC ACID, PLASMA
Lactic Acid, Venous: 0.8 mmol/L (ref 0.5–2.0)
Lactic Acid, Venous: 1.8 mmol/L (ref 0.5–2.0)
Lactic Acid, Venous: 0.7 mmol/L (ref 0.5–2.0)

## 2015-07-04 LAB — I-STAT CG4 LACTIC ACID, ED: Lactic Acid, Venous: 1.23 mmol/L (ref 0.5–2.0)

## 2015-07-04 LAB — BRAIN NATRIURETIC PEPTIDE: B Natriuretic Peptide: 183 pg/mL — ABNORMAL HIGH (ref 0.0–100.0)

## 2015-07-04 LAB — PROTIME-INR
INR: 1.2 (ref 0.00–1.49)
Prothrombin Time: 15.3 seconds — ABNORMAL HIGH (ref 11.6–15.2)

## 2015-07-04 LAB — MRSA PCR SCREENING: MRSA by PCR: POSITIVE — AB

## 2015-07-04 MED ORDER — VANCOMYCIN HCL 10 G IV SOLR
1500.0000 mg | Freq: Once | INTRAVENOUS | Status: AC
  Administered 2015-07-04: 1500 mg via INTRAVENOUS
  Filled 2015-07-04: qty 1500

## 2015-07-04 MED ORDER — SODIUM CHLORIDE 0.9 % IV BOLUS (SEPSIS)
1000.0000 mL | Freq: Once | INTRAVENOUS | Status: AC
  Administered 2015-07-04: 1000 mL via INTRAVENOUS

## 2015-07-04 MED ORDER — HEPARIN SODIUM (PORCINE) 5000 UNIT/ML IJ SOLN
5000.0000 [IU] | Freq: Three times a day (TID) | INTRAMUSCULAR | Status: DC
  Administered 2015-07-04 – 2015-07-08 (×13): 5000 [IU] via SUBCUTANEOUS
  Filled 2015-07-04 (×16): qty 1

## 2015-07-04 MED ORDER — HYDROCORTISONE NA SUCCINATE PF 100 MG IJ SOLR
100.0000 mg | Freq: Four times a day (QID) | INTRAMUSCULAR | Status: DC
  Administered 2015-07-04 – 2015-07-05 (×3): 100 mg via INTRAVENOUS
  Filled 2015-07-04 (×3): qty 2

## 2015-07-04 MED ORDER — PIPERACILLIN-TAZOBACTAM IN DEX 2-0.25 GM/50ML IV SOLN
2.2500 g | Freq: Three times a day (TID) | INTRAVENOUS | Status: DC
  Administered 2015-07-04 – 2015-07-07 (×9): 2.25 g via INTRAVENOUS
  Filled 2015-07-04 (×12): qty 50

## 2015-07-04 MED ORDER — NEPRO/CARBSTEADY PO LIQD
237.0000 mL | Freq: Every day | ORAL | Status: DC
  Administered 2015-07-05 – 2015-07-08 (×4): 237 mL via ORAL
  Filled 2015-07-04 (×6): qty 237

## 2015-07-04 MED ORDER — FAMOTIDINE 20 MG PO TABS
20.0000 mg | ORAL_TABLET | Freq: Every day | ORAL | Status: DC
  Administered 2015-07-04 – 2015-07-08 (×5): 20 mg via ORAL
  Filled 2015-07-04 (×5): qty 1

## 2015-07-04 MED ORDER — LATANOPROST 0.005 % OP SOLN
1.0000 [drp] | Freq: Every day | OPHTHALMIC | Status: DC
  Administered 2015-07-05 – 2015-07-07 (×3): 1 [drp] via OPHTHALMIC
  Filled 2015-07-04: qty 2.5

## 2015-07-04 MED ORDER — SODIUM BICARBONATE 650 MG PO TABS
1300.0000 mg | ORAL_TABLET | Freq: Three times a day (TID) | ORAL | Status: DC
  Administered 2015-07-04 – 2015-07-08 (×12): 1300 mg via ORAL
  Filled 2015-07-04 (×14): qty 2

## 2015-07-04 MED ORDER — HYDROCODONE-ACETAMINOPHEN 5-325 MG PO TABS
1.0000 | ORAL_TABLET | ORAL | Status: DC | PRN
  Administered 2015-07-04 – 2015-07-08 (×16): 1 via ORAL
  Filled 2015-07-04 (×16): qty 1

## 2015-07-04 MED ORDER — RISAQUAD PO CAPS
2.0000 | ORAL_CAPSULE | Freq: Every day | ORAL | Status: DC
  Administered 2015-07-04 – 2015-07-08 (×5): 2 via ORAL
  Filled 2015-07-04 (×5): qty 2

## 2015-07-04 MED ORDER — MORPHINE SULFATE (PF) 2 MG/ML IV SOLN
2.0000 mg | INTRAVENOUS | Status: DC | PRN
  Administered 2015-07-04 (×2): 2 mg via INTRAVENOUS
  Filled 2015-07-04 (×2): qty 1

## 2015-07-04 MED ORDER — SODIUM CHLORIDE 0.9 % IV BOLUS (SEPSIS)
250.0000 mL | Freq: Once | INTRAVENOUS | Status: AC
  Administered 2015-07-04: 250 mL via INTRAVENOUS

## 2015-07-04 MED ORDER — SODIUM BICARBONATE 650 MG PO TABS
650.0000 mg | ORAL_TABLET | Freq: Two times a day (BID) | ORAL | Status: DC
  Administered 2015-07-04: 650 mg via ORAL
  Filled 2015-07-04 (×2): qty 1

## 2015-07-04 MED ORDER — ONDANSETRON HCL 4 MG/2ML IJ SOLN: 4.0000 mg | Freq: Three times a day (TID) | INTRAMUSCULAR | Status: DC | PRN

## 2015-07-04 MED ORDER — ONDANSETRON HCL 4 MG/2ML IJ SOLN: 4.0000 mg | Freq: Once | INTRAMUSCULAR | Status: DC | PRN

## 2015-07-04 MED ORDER — HYDROMORPHONE HCL 1 MG/ML IJ SOLN: 0.2500 mg | INTRAMUSCULAR | Status: DC | PRN

## 2015-07-04 MED ORDER — SODIUM CHLORIDE 0.9 % IJ SOLN
3.0000 mL | Freq: Two times a day (BID) | INTRAMUSCULAR | Status: DC
  Administered 2015-07-04 – 2015-07-07 (×2): 3 mL via INTRAVENOUS

## 2015-07-04 MED ORDER — ACETAMINOPHEN 500 MG PO TABS
500.0000 mg | ORAL_TABLET | Freq: Four times a day (QID) | ORAL | Status: DC | PRN
  Administered 2015-07-04 (×2): 500 mg via ORAL
  Filled 2015-07-04: qty 1

## 2015-07-04 MED ORDER — DEXTROMETHORPHAN POLISTIREX ER 30 MG/5ML PO SUER
15.0000 mg | Freq: Three times a day (TID) | ORAL | Status: DC | PRN
  Filled 2015-07-04: qty 5

## 2015-07-04 MED ORDER — ACETAMINOPHEN 500 MG PO TABS
1000.0000 mg | ORAL_TABLET | Freq: Once | ORAL | Status: AC
  Administered 2015-07-04: 1000 mg via ORAL
  Filled 2015-07-04: qty 2

## 2015-07-04 MED ORDER — MEPERIDINE HCL 25 MG/ML IJ SOLN: 6.2500 mg | INTRAMUSCULAR | Status: DC | PRN

## 2015-07-04 MED ORDER — SODIUM CHLORIDE 0.9 % IV SOLN
INTRAVENOUS | Status: DC
  Administered 2015-07-04 – 2015-07-06 (×5): via INTRAVENOUS

## 2015-07-04 MED ORDER — MUPIROCIN 2 % EX OINT
1.0000 "application " | TOPICAL_OINTMENT | Freq: Two times a day (BID) | CUTANEOUS | Status: DC
  Administered 2015-07-04 – 2015-07-08 (×8): 1 via NASAL
  Filled 2015-07-04: qty 22

## 2015-07-04 MED ORDER — TIMOLOL MALEATE 0.5 % OP SOLN
1.0000 [drp] | Freq: Every morning | OPHTHALMIC | Status: DC
  Administered 2015-07-04 – 2015-07-08 (×5): 1 [drp] via OPHTHALMIC
  Filled 2015-07-04 (×2): qty 5

## 2015-07-04 MED ORDER — VANCOMYCIN HCL IN DEXTROSE 1-5 GM/200ML-% IV SOLN
1000.0000 mg | INTRAVENOUS | Status: DC
  Administered 2015-07-06: 1000 mg via INTRAVENOUS
  Filled 2015-07-04 (×2): qty 200

## 2015-07-04 MED ORDER — ACETAMINOPHEN 500 MG PO TABS
500.0000 mg | ORAL_TABLET | Freq: Four times a day (QID) | ORAL | Status: DC | PRN
  Filled 2015-07-04: qty 1

## 2015-07-04 MED ORDER — ONDANSETRON HCL 4 MG/2ML IJ SOLN
4.0000 mg | INTRAMUSCULAR | Status: DC | PRN
  Administered 2015-07-04: 4 mg via INTRAVENOUS
  Filled 2015-07-04: qty 2

## 2015-07-04 MED ORDER — PIPERACILLIN-TAZOBACTAM 3.375 G IVPB
3.3750 g | Freq: Once | INTRAVENOUS | Status: AC
  Administered 2015-07-04: 3.375 g via INTRAVENOUS
  Filled 2015-07-04: qty 50

## 2015-07-04 MED ORDER — SALINE SPRAY 0.65 % NA SOLN: 1.0000 | Freq: Three times a day (TID) | NASAL | Status: DC | PRN

## 2015-07-04 MED ORDER — SODIUM CHLORIDE 0.9 % IV BOLUS (SEPSIS): 500.0000 mL | Freq: Once | INTRAVENOUS | Status: DC

## 2015-07-04 NOTE — H&P (Signed)
[facility] History and Physical  [name] [id number] DOB: [birth year] DOA: 07/03/2015  Referring physician: ED physician PCP: [name], MD  Specialists:   Chief Complaint: Fever, nausea, right flank pain  HPI: [name] is a 70 y.o. female with PMH of kidney stone, bilateral hydronephrosis, s/p of right-sided nephrostomy, diastolic congestive heart failure, chronic kidney disease-V, CAD, myocardial infarction, PVD, glaucoma, GERD, hypertension, hyperlipidemia, C. difficile colitis, hx of atrial fibrillation (it was triggered by sepsis, and resolved. She does need anticoagulants per her cardiologist), who presents with fever, nausea and right flank pain.  Patient reports that she has history of kidney stone and hydronephrosis, is s/p of right-sided nephrostomy. She had nephrostomy changed by IR today. After that, she developed fever with temperature 101.4, nausea and vomited once. She has right flank pain. Due to having nephrostomy, she urinates very little, and denies symptoms of UTI. She reports that she had one BM loose stool in the emergency room. No abdominal pain. Patient does not have chest pain, shortness of breath, cough, unilateral weakness. Of note, urine culture on 05/22/15 showed MRSA which was sensitive to vancomycin.  In ED, patient was found to have WBC 18.4, temperature 102.1, tachycardia, tachypnea, stable renal function, negative chest x-ray, lactate 1.23, pending urinalysis. Patient's admitted to inpatient for further eval and treatment.  EKG: Not done in ED, will get one.   Where does patient live?   At home    Can patient participate in ADLs? Little  Review of Systems:   General: has fevers, chills, no changes in body weight, has poor appetite, has fatigue HEENT: no blurry vision, hearing changes or sore throat Pulm: no dyspnea, coughing, wheezing CV: no chest pain, palpitations Abd: has nausea, vomiting, no abdominal pain, has diarrhea,  no constipation. Has R flank pain. GU: no dysuria, burning on urination, increased urinary frequency, hematuria.  Ext: no leg edema Neuro: no unilateral weakness, numbness, or tingling, no vision change or hearing loss Skin: no rash MSK: No muscle spasm, no deformity, no limitation of range of movement in spin Heme: No easy bruising.  Travel history: No recent long distant travel.  Allergy:  Allergies  Allergen Reactions  . Vicodin [Hydrocodone-Acetaminophen] Nausea And Vomiting  . Chlorhexidine Rash    Sunburn    rash  . Percocet [Oxycodone-Acetaminophen] Nausea And Vomiting    Past Medical History  Diagnosis Date  . GERD (gastroesophageal reflux disease)   . Glaucoma   . H/O renal calculi 2002 & 2006  . H/O hiatal hernia   . Headache(784.0)     migraine  . Pneumonia     dx 10-06-2014 per CXR--  on 10-27-2014 pt states finished antibiotic and denies cough or fever  . Hyperlipidemia   . History of MI (myocardial infarction)     10/ 2013 in setting of Septic Shock  . History of atrial fibrillation     10/ 2013  in setting of Septic Shock  . History of CHF (congestive heart failure)     10/ 2013 in setting of septic shock  . Sigmoid diverticulosis   . Nephrolithiasis     bilateral  . CKD (chronic kidney disease), stage III     nephrologist-  dr Florene Glen  . Right ureteral stone   . History of acute respiratory failure     10/ 2013  -- ventilated in setting septic shock  . Dysrhythmia     Afib in 2013 when she had septic shock related to stone ureteral obstruction  .  Myocardial infarction [facility])     Was reported in 2013 during hospitalization with septic shock  . Septic shock ([place]) 04/04/2012  . Sepsis ([place])   . Hypertension     medication removed from regimen due to low blood pressure   . Peripheral vascular disease ([place])   . S/P hemodialysis catheter insertion ([place]) 04/11/2015     right anterior chest , only used once   . History of nephrostomy 04/11/2015      currently inplace 04/28/2015   . History of blood transfusion 04/13/2015   . Low iron     hx  . Glaucoma   . Complication of anesthesia     use a little anesthesia , per patient MD states she quit breathing     Past Surgical History  Procedure Laterality Date  . Cystoscopy w/ ureteral stent placement  04/04/2012    Procedure: CYSTOSCOPY WITH RETROGRADE PYELOGRAM/URETERAL STENT PLACEMENT;  Surgeon: Ailene Rud, MD;  Location: [place];  Service: Urology;  Laterality: Left;  . Total abdominal hysterectomy w/ bilateral salpingoophorectomy  1993    secondary to fibroids  . Breast biopsy Left 08/23/07    benign fibrocystic with duct ectasia  . Cardiac catheterization  07-11-2012  dr Irish Lack    Abnormal stress test/   normal coronary arteries/  LVEDP  43mmHg  . Cardiovascular stress test  06-26-2012  dr Irish Lack    marked ischemia in the basal anterior, mid anterior, apical inferior regions/  normal LVF, ef 63%  . Transthoracic echocardiogram  04-09-2012    normal LVF,  ef 60-65%,  mild LAE,  mild TR, trivial MR and PR  . Cataract extraction w/ intraocular lens  implant, bilateral    . Knee arthroscopy Left 02-14-2003  . Laparoscopic cholecystectomy  03-23-2005  . Extracorporeal shock wave lithotripsy  05-28-2012  &  10-08-2012  . Cystoscopy with stent placement Right 10/28/2014    Procedure: RIGHT URETERAL STENT PLACEMENT;  Surgeon: Irine Seal, MD;  [facility];  Service: Urology;  Laterality: Right;  . Cystoscopy/retrograde/ureteroscopy/stone extraction with basket Right 11/21/2014    Procedure: CYSTOSCOPY/RIGHT RETROGRADE PYELOGRAM/RIGHT URETEROSCOPY/BASKET EXTRACTION/RIGHT PYELOSCOPY/LASER OF STONE/RIGHT DOUBLE J STENT;  Surgeon: Carolan Clines, MD;  Location: [place];  Service: Urology;  Laterality: Right;  . Holmium laser application Right [id number]    Procedure: HOLMIUM LASER APPLICATION;  Surgeon: Carolan Clines, MD;  [facility];  Service: Urology;  Laterality: Right;  . Cystoscopy with stent placement Right 02/26/2015    Procedure: CYSTOSCOPY RETROGRADE PYELOGRAM WITH STENT PLACEMENT;  Surgeon: Cleon Gustin, MD;  Location: [facility];  Service: Urology;  Laterality: Right;  . Cystoscopy w/ ureteral stent placement Bilateral 05/04/2015    Procedure: CYSTOSCOPY WITH BILATERAL RETROGRADE PYELOGRAM/ WITH INTERPRETATION, EXCHANGE OF RIGHT URETERAL STENT REPLACEMENT AND PLACEMENT LEFT URETERAL STENT PLACEMENT EXAMINATION OF VAGINA;  Surgeon: Carolan Clines, MD;  Location: [facility];  Service: Urology;  Laterality: Bilateral;  . Av fistula placement Left 06/02/2015    Procedure: BRACHIOCEPHALIC ARTERIOVENOUS (AV) FISTULA CREATION ;  Surgeon: Conrad Luray, MD;  Location: [place];  Service: Vascular;  Laterality: Left;    Social History:  reports that she has never smoked. She has never used smokeless tobacco. She reports that she does not drink alcohol or use illicit drugs.  Family History:  Family History  Problem Relation Age of Onset  . Hypertension Mother   . Cancer Mother 58    breast  . Dementia Mother   .  Hypertension Brother   . Diabetes Brother   . Cancer Father 65    pancreatic  . Heart failure Paternal Grandmother   . Bladder Cancer Maternal Grandfather      Prior to Admission medications   Medication Sig Start Date End Date Taking? Authorizing Provider  acetaminophen (TYLENOL) 500 MG tablet Take 500 mg by mouth every 6 (six) hours as needed for moderate pain.   Yes Historical Provider, MD  acidophilus (RISAQUAD) CAPS capsule Take 2 capsules by mouth daily. 05/07/15  Yes Maryann Mikhail, DO  bimatoprost (LUMIGAN) 0.01 % SOLN Place 1 drop into both eyes at bedtime.   Yes Historical Provider, MD  dextromethorphan (DELSYM) 30 MG/5ML liquid Take 2.5 mLs (15 mg total) by mouth every 8 (eight) hours as needed for cough. 05/30/15  Yes Robbie Lis, MD  diphenoxylate-atropine (LOMOTIL)  2.5-0.025 MG/5ML liquid Take 10 mLs by mouth 4 (four) times daily as needed for diarrhea or loose stools. 05/30/15  Yes Robbie Lis, MD  HYDROcodone-acetaminophen (NORCO/VICODIN) 5-325 MG tablet Take 1 tablet by mouth every 4 (four) hours as needed for moderate pain. 06/02/15  Yes Ulyses Amor, PA-C  Nutritional Supplements (FEEDING SUPPLEMENT, NEPRO CARB STEADY,) LIQD Take 237 mLs by mouth daily. 05/07/15  Yes Maryann Mikhail, DO  ondansetron (ZOFRAN) 4 MG tablet Take 1 tablet (4 mg total) by mouth every 6 (six) hours as needed for nausea. 05/30/15  Yes Robbie Lis, MD  ranitidine (ZANTAC) 150 MG tablet Take 150 mg by mouth 2 (two) times daily as needed for heartburn.   Yes Historical Provider, MD  saccharomyces boulardii (FLORASTOR) 250 MG capsule Take 1 capsule (250 mg total) by mouth 2 (two) times daily. 05/30/15  Yes Robbie Lis, MD  sodium bicarbonate 650 MG tablet Take 1 tablet (650 mg total) by mouth 2 (two) times daily. 05/30/15  Yes Robbie Lis, MD  sodium chloride (OCEAN) 0.65 % SOLN nasal spray Place 1 spray into both nostrils 3 (three) times daily as needed for congestion.   Yes Historical Provider, MD  timolol (BETIMOL) 0.5 % ophthalmic solution Place 1 drop into both eyes every morning.    Yes Historical Provider, MD    Physical Exam: Filed Vitals:   07/03/15 2315 07/04/15 0117 07/04/15 0130 07/04/15 0215  BP: 126/64 110/59 103/56 116/68  Pulse: 118 99 96 104  Temp: 97.9 F (36.6 C) 102.1 F (38.9 C)    TempSrc: Oral Rectal    Resp: 22 21 19 22   SpO2: 97% 99% 93% 95%   General: Not in acute distress HEENT:       Eyes: PERRL, EOMI, no scleral icterus.       ENT: No discharge from the ears and nose, no pharynx injection, no tonsillar enlargement.        Neck: No JVD, no bruit, no mass felt. Heme: No neck lymph node enlargement. Cardiac: S1/S2, RRR, tachycardia, No murmurs, No gallops or rubs. Pulm: No rales, wheezing, rhonchi or rubs. Abd: Soft, nondistended,  nontender, no rebound pain, no organomegaly, BS present. GU: positive R CVA tenderness. Has R nephrostomy in place. Ext: No pitting leg edema bilaterally. 2+DP/PT pulse bilaterally. Musculoskeletal: No joint deformities, No joint redness or warmth, no limitation of ROM in spin. Skin: No rashes.  Neuro: Alert, oriented X3, cranial nerves II-XII grossly intact, moves all extremities. Psych: Patient is not psychotic, no suicidal or hemocidal ideation.  Labs on Admission:  Basic Metabolic Panel:  Recent Labs Lab 07/03/15 2358  NA 133*  K 4.6  CL 104  CO2 14*  GLUCOSE 163*  BUN 64*  CREATININE 5.12*  CALCIUM 8.9   Liver Function Tests:  Recent Labs Lab 07/03/15 2358  AST 18  ALT 13*  ALKPHOS 88  BILITOT 0.6  PROT 7.1  ALBUMIN 3.4*   No results for input(s): LIPASE, AMYLASE in the last 168 hours. No results for input(s): AMMONIA in the last 168 hours. CBC:  Recent Labs Lab 07/02/15 1102 07/03/15 2358  WBC  --  18.4*  NEUTROABS  --  17.4*  HGB 12.5 13.7  HCT  --  43.2  MCV  --  86.6  PLT  --  252   Cardiac Enzymes: No results for input(s): CKTOTAL, CKMB, CKMBINDEX, TROPONINI in the last 168 hours.  BNP (last 3 results)  Recent Labs  02/26/15 0530 05/23/15 0602  BNP 35.3 37.2    ProBNP (last 3 results) No results for input(s): PROBNP in the last 8760 hours.  CBG: No results for input(s): GLUCAP in the last 168 hours.  Radiological Exams on Admission: Dg Chest 2 View  07/04/2015  CLINICAL DATA:  70 year old female with fever. New nephrostomy tube placement. EXAM: CHEST  2 VIEW COMPARISON:  Radiograph dated 05/23/2015 FINDINGS: The right-sided dialysis catheter with tip at the cavoatrial junction. There is no focal consolidation, pleural effusion, or pneumothorax. There are bilateral central and hilar vascular prominence. Retrocardiac air correspond to the hiatal hernia seen on the CT. The cardiac silhouette is within normal limits. The osseous structures  appear unremarkable. IMPRESSION: No focal consolidation. Electronically Signed   By: Anner Crete M.D.   On: 07/04/2015 00:53   Ir Nephrostomy Exchange Right  07/03/2015  CLINICAL DATA:  CHRONIC RIGHT HYDRONEPHROSIS, CHRONIC INDWELLING EXTERNAL RIGHT NEPHROSTOMY, BILATERAL INTERNAL URETERAL STENTS EXAM: FLUOROSCOPIC RIGHT 10 FRENCH NEPHROSTOMY EXCHANGE Date:  1/13/20171/13/2017 11:58 am Radiologist:  M. Daryll Brod, MD Guidance:  Fluoroscopic FLUOROSCOPY TIME:  1.4 minutes, 18.6 mGy MEDICATIONS AND MEDICAL HISTORY: 1% lidocaine local ANESTHESIA/SEDATION: None. CONTRAST:  79mL OMNIPAQUE IOHEXOL 300 MG/ML  SOLN COMPLICATIONS: None immediate PROCEDURE: Informed consent was obtained from the patient following explanation of the procedure, risks, benefits and alternatives. The patient understands, agrees and consents for the procedure. All questions were addressed. A time out was performed. Maximal barrier sterile technique utilized including caps, mask, sterile gowns, sterile gloves, large sterile drape, hand hygiene, and Betadine. Under sterile conditions and local anesthesia, the existing right nephrostomy catheter was injected with contrast confirming position in the renal pelvis. Stable ureteral stents. Nephrostomy catheter was cut and removed over a Bentson guidewire. New 10 French nephrostomy was advanced with the retention loop formed in the renal pelvis. Position confirmed with fluoroscopy. Images obtained for documentation. Catheter secured with a Prolene suture and connected to external gravity drainage. Sterile dressing applied. No immediate complication. IMPRESSION: Successful fluoroscopic exchange of the 10 French right nephrostomy Electronically Signed   By: Jerilynn Mages.  Shick M.D.   On: 07/03/2015 14:17    Assessment/Plan Principal Problem:   Sepsis ([place]) Active Problems:   Nephrolithiasis   Essential hypertension, benign   Acute pyelonephritis   CKD (chronic kidney disease), stage V (H[facility])    Bilateral hydronephrosis   CAD (coronary artery disease)   Chronic diastolic CHF (congestive heart failure) ([place])   Essential hypertension   Hydronephrosis with urinary obstruction due to renal calculus   Sepsis [facility]): Patient is septic with leukocytosis, fever, tachycardia and tachypnea. Hemodynamically stable. Lactate is 1.23. This is likely due to  pyelonephritis secondary to nephrostomy manipulation today. Patient had one episode of loose stool, need to rule out C. difficile colitis. She had positive urinalysis with MRSA on 05/22/15, need antibiotic with broader coverage.  -Will admit to SDU since pt has high risk of deteriorating -start IV vanco and zosyn -check C diff pcr, Bx and Ux -will get Procalcitonin and trend lactic acid levels per sepsis protocol. -IVF: 2L of NS bolus in ED, followed by 100 cc/h -When necessary Zofran for nausea, and morphine and percoct for pain  Hx of Nephrolithiasis, bilateral hydronephrosis: s/p of R nephrostomy, which was changed by IR today. Renal function stable. -follow renal Fx by BMP - Pain control  Essential hypertension: not on meds at home -observe closely  CKD (chronic kidney disease), stage V ([place]): Baseline creatinine 4.87-5.23 recently, her creatinine is 5.12, BUN 64. Renal function stable. -Follow-up by BMP -continue bicarbonate   CAD (coronary artery disease): No CP. -Observe closely  GERD: -Pepcid   Chronic diastolic CHF (congestive heart failure) ([place]): 2-D echo on 10/23/14 showed EF 65-70 percent. Patient is not on diuretics at home. Patient does not have leg edema. CHF is compensated.  -Check BMP -Monitor volume status closely when patient is receiving fluid resuscitation.   DVT ppx: SQ Heparin   Code Status: DNR Family Communication:  Yes, patient's  daughter  at bed side Disposition Plan: Admit to inpatient   Date of Service 07/04/2015    Ivor Costa [facility] Pager [phone]  If 7PM-7AM, please contact  night-coverage [email or web address] Password [email or web address] 07/04/2015, 2:54 AM

## 2015-07-04 NOTE — ED Notes (Signed)
Patient transported to X-ray 

## 2015-07-04 NOTE — ED Notes (Addendum)
Informed RN of IV site on pt.  Cannot use left side.   IV is in the hand on the right side.

## 2015-07-04 NOTE — Progress Notes (Signed)
CRITICAL VALUE ALERT  Critical value received:  Positive MRSA  Date of notification:  07/04/2015  Time of notification:  1800  Critical value read back:Yes.    Nurse who received alert:  Jeannie Fend RN  MD notified (1st page):   Dr. [place] Pink  Time of first page:  48  MD notified (2nd page):  Time of second page:  Responding MD:    Time MD responded:  1801

## 2015-07-04 NOTE — ED Notes (Signed)
EKG given to [facility] for review.

## 2015-07-04 NOTE — Progress Notes (Signed)
[facility] PROGRESS NOTE  [name] [id number] DOB: 1946-01-08 DOA: 07/03/2015  PCP: [name], MD  Brief HPI: 70 year old Caucasian female with a past medical history of nephrolithiasis, bilateral hydronephrosis, status post right nephrostomy, diastolic congestive heart failure, chronic kidney disease stage V, coronary artery disease who has had multiple hospitalizations in the past few months due to sepsis associated with her nephrostomy. She had her nephrostomy tube changed on January 13. This was followed by onset of fever, nausea and vomiting and flank pain. She presented back to the emergency department and was found to have sepsis. She was subsequently hospitalized.  Past medical history:  Past Medical History  Diagnosis Date  . GERD (gastroesophageal reflux disease)   . Glaucoma   . H/O renal calculi 2002 & 2006  . H/O hiatal hernia   . Headache(784.0)     migraine  . Pneumonia     dx 10-06-2014 per CXR--  on 10-27-2014 pt states finished antibiotic and denies cough or fever  . Hyperlipidemia   . History of MI (myocardial infarction)     10/ 2013 in setting of Septic Shock  . History of atrial fibrillation     10/ 2013  in setting of Septic Shock  . History of CHF (congestive heart failure)     10/ 2013 in setting of septic shock  . Sigmoid diverticulosis   . Nephrolithiasis     bilateral  . CKD (chronic kidney disease), stage III     nephrologist-  dr Florene Glen  . Right ureteral stone   . History of acute respiratory failure     10/ 2013  -- ventilated in setting septic shock  . Dysrhythmia     Afib in 2013 when she had septic shock related to stone ureteral obstruction  . Myocardial infarction [facility])     Was reported in 2013 during hospitalization with septic shock  . Septic shock ([place]) 04/04/2012  . Sepsis ([place])   . Hypertension     medication removed from regimen due to low blood pressure   . Peripheral vascular disease ([place])   . S/P  hemodialysis catheter insertion ([place]) 04/11/2015     right anterior chest , only used once   . History of nephrostomy 04/11/2015     currently inplace 04/28/2015   . History of blood transfusion 04/13/2015   . Low iron     hx  . Glaucoma   . Complication of anesthesia     use a little anesthesia , per patient MD states she quit breathing     Consultants: None So far  Procedures: None  Antibiotics: Vancomycin and Zosyn  Subjective: Patient denies any dizziness or lightheadedness. She feels miserable. She has pain in the right flank area. Has some nausea but no vomiting.  Objective: Vital Signs  Filed Vitals:   07/04/15 0929 07/04/15 0945 07/04/15 1030 07/04/15 1118  BP:  89/54 96/63 88/53   Pulse:    113  Temp: 103 F (39.4 C)   100.8 F (38.2 C)  TempSrc: Rectal   Rectal  Resp:  18 20 22   SpO2:    96%    Intake/Output Summary (Last 24 hours) at 07/04/15 1338 Last data filed at 07/04/15 1018  Gross per 24 hour  Intake   1550 ml  Output    150 ml  Net   1400 ml   There were no vitals filed for this visit.  General appearance: alert, cooperative, appears stated age and no distress Resp:  Diminished air entry at the bases. No crackles, wheezing or rhonchi. Right-sided dialysis catheter noted. She has a fistula in the left arm. Cardio: S1, S2 is tachycardic. Regular. No S3, S4. No rubs, murmurs, or bruit. No pedal edema. GI: Abdomen is soft. Mildly tender diffusely without any rebound, rigidity or guarding. There is a nephrostomy tube in the right. No erythema is noted. Bowel sounds are present. No masses or organomegaly. Extremities: extremities normal, atraumatic, no cyanosis or edema Neurologic: Alert and oriented 3. No focal neurological deficits are noted.  Lab Results:  Basic Metabolic Panel:  Recent Labs Lab 07/03/15 2358 07/04/15 1014  NA 133* 137  K 4.6 4.0  CL 104 114*  CO2 14* 13*  GLUCOSE 163* 111*  BUN 64* 57*  CREATININE 5.12* 4.71*  CALCIUM  8.9 7.8*   Liver Function Tests:  Recent Labs Lab 07/03/15 2358  AST 18  ALT 13*  ALKPHOS 88  BILITOT 0.6  PROT 7.1  ALBUMIN 3.4*   CBC:  Recent Labs Lab 07/02/15 1102 07/03/15 2358 07/04/15 1014  WBC  --  18.4* 16.9*  NEUTROABS  --  17.4*  --   HGB 12.5 13.7 10.5*  HCT  --  43.2 34.0*  MCV  --  86.6 88.8  PLT  --  252 208   BNP (last 3 results)  Recent Labs  02/26/15 0530 05/23/15 0602 07/04/15 1014  BNP 35.3 37.2 183.0*    CBG:  Recent Labs Lab 07/04/15 0924  GLUCAP 122*    No results found for this or any previous visit (from the past 240 hour(s)).    Studies/Results: Dg Chest 2 View  07/04/2015  CLINICAL DATA:  70 year old female with fever. New nephrostomy tube placement. EXAM: CHEST  2 VIEW COMPARISON:  Radiograph dated 05/23/2015 FINDINGS: The right-sided dialysis catheter with tip at the cavoatrial junction. There is no focal consolidation, pleural effusion, or pneumothorax. There are bilateral central and hilar vascular prominence. Retrocardiac air correspond to the hiatal hernia seen on the CT. The cardiac silhouette is within normal limits. The osseous structures appear unremarkable. IMPRESSION: No focal consolidation. Electronically Signed   By: Anner Crete M.D.   On: 07/04/2015 00:53   Ir Nephrostomy Exchange Right  07/03/2015  CLINICAL DATA:  CHRONIC RIGHT HYDRONEPHROSIS, CHRONIC INDWELLING EXTERNAL RIGHT NEPHROSTOMY, BILATERAL INTERNAL URETERAL STENTS EXAM: FLUOROSCOPIC RIGHT 10 FRENCH NEPHROSTOMY EXCHANGE Date:  1/13/20171/13/2017 11:58 am Radiologist:  M. Daryll Brod, MD Guidance:  Fluoroscopic FLUOROSCOPY TIME:  1.4 minutes, 18.6 mGy MEDICATIONS AND MEDICAL HISTORY: 1% lidocaine local ANESTHESIA/SEDATION: None. CONTRAST:  63mL OMNIPAQUE IOHEXOL 300 MG/ML  SOLN COMPLICATIONS: None immediate PROCEDURE: Informed consent was obtained from the patient following explanation of the procedure, risks, benefits and alternatives. The patient  understands, agrees and consents for the procedure. All questions were addressed. A time out was performed. Maximal barrier sterile technique utilized including caps, mask, sterile gowns, sterile gloves, large sterile drape, hand hygiene, and Betadine. Under sterile conditions and local anesthesia, the existing right nephrostomy catheter was injected with contrast confirming position in the renal pelvis. Stable ureteral stents. Nephrostomy catheter was cut and removed over a Bentson guidewire. New 10 French nephrostomy was advanced with the retention loop formed in the renal pelvis. Position confirmed with fluoroscopy. Images obtained for documentation. Catheter secured with a Prolene suture and connected to external gravity drainage. Sterile dressing applied. No immediate complication. IMPRESSION: Successful fluoroscopic exchange of the 10 French right nephrostomy Electronically Signed   By: Jerilynn Mages.  Shick M.D.  On: 07/03/2015 14:17    Medications:  Scheduled: . famotidine  20 mg Oral Daily  . feeding supplement (NEPRO CARB STEADY)  237 mL Oral Daily  . heparin  5,000 Units Subcutaneous 3 times per day  . hydrocortisone sod succinate (SOLU-CORTEF) inj  100 mg Intravenous Q6H  . latanoprost  1 drop Both Eyes QHS  . sodium bicarbonate  1,300 mg Oral TID  . sodium chloride  3 mL Intravenous Q12H  . timolol  1 drop Both Eyes q morning - 10a   Continuous: . sodium chloride Stopped (07/04/15 1040)  . piperacillin-tazobactam (ZOSYN)  IV 2.25 g (07/04/15 1113)  . sodium chloride    . [START ON 07/06/2015] vancomycin     [id number], dextromethorphan, HYDROcodone-acetaminophen, morphine injection, ondansetron (ZOFRAN) IV, sodium chloride  Assessment/Plan:  Principal Problem:   Sepsis ([place]) Active Problems:   Nephrolithiasis   Essential hypertension, benign   Acute pyelonephritis   CKD (chronic kidney disease), stage V (HCC)   Bilateral hydronephrosis   CAD (coronary artery disease)    Chronic diastolic CHF (congestive heart failure) ([place])   Essential hypertension   Hydronephrosis with urinary obstruction due to renal calculus    Sepsis , likely from urinary source Patient is septic with leukocytosis, fever, tachycardia and tachypnea. Actiq acid is normal. Blood pressure noted to be low this morning with tachycardia. Lactic acid level was repeated and still normal. She'll be given IV fluids. She is not on any antihypertensive medications. She appears to be symptomatic. Cortisol level will be checked and she'll be given stress dose steroids. C. difficile is pending, although there is no history of diarrhea. She apparently had one loose stool. She is on vancomycin and Zosyn, which will be continued. Culture data is pending.   Hx of Nephrolithiasis, bilateral hydronephrosis s/p R nephrostomy, which was changed by IR 07/03/15. Pain management. Her urologist is Dr. Gaynelle Arabian.  CKD (chronic kidney disease), stage V , with non-anion gap metabolic acidosis Baseline creatinine 4.87-5.23. Renal function appears to be stable. Continue IV fluids. Bicarbonate noted to be low. We will increase her oral bicarbonate. Monitor urine output. She is followed by Dr. Florene Glen. She has a dialysis catheter in the right chest. She has a fistula placed in the left arm. No erythema noted around the dialysis catheter site. Low threshold to involve nephrology if her renal function gets worse.  Hypotension  Not noted to be on any blood pressure lowering agents. Blood pressures lower, likely due to hypovolemia. Lactic acid level is reassuringly normal. Continue IV hydration. Stress dose steroids.   CAD (coronary artery disease) Appears to be stable. Continue to monitor.   GERD Pepcid   Chronic diastolic CHF (congestive heart failure) 2-D echo on 10/23/14 showed EF 65-70 percent. Patient is not on diuretics at home. Patient does not have leg edema. CHF is compensated. Monitor volume status closely when  patient is receiving fluid resuscitation.  DVT Prophylaxis: Subcutaneous heparin    Code Status: DO NOT RESUSCITATE  Family Communication: Discussed with the patient  Disposition Plan: Continue stepdown monitoring.    LOS: 0 days   [place] [facility] Pager [phone] 07/04/2015, 1:38 PM  If 7PM-7AM, please contact night-coverage at [email or web address], password [facility]

## 2015-07-04 NOTE — ED Notes (Signed)
Blankets removed from patient to assist in decreasing temperature.

## 2015-07-04 NOTE — ED Notes (Signed)
MD at bedside. 

## 2015-07-04 NOTE — Progress Notes (Signed)
ANTIBIOTIC CONSULT NOTE - INITIAL  Pharmacy Consult for Zosyn/Vancomycin Indication: Sepsis  Allergies  Allergen Reactions  . Vicodin [Hydrocodone-Acetaminophen] Nausea And Vomiting  . Chlorhexidine Rash    Sunburn    rash  . Percocet [Oxycodone-Acetaminophen] Nausea And Vomiting    Patient Measurements:   Wt=79 kg  Vital Signs: Temp: 102.1 F (38.9 C) (01/14 0117) Temp Source: Rectal (01/14 0117) BP: 116/68 mmHg (01/14 0215) Pulse Rate: 104 (01/14 0215) Intake/Output from previous day:   Intake/Output from this shift:    Labs:  Recent Labs  07/02/15 1102 07/03/15 2358  WBC  --  18.4*  HGB 12.5 13.7  PLT  --  252  CREATININE  --  5.12*   Estimated Creatinine Clearance: 10.2 mL/min (by C-G formula based on Cr of 5.12). No results for input(s): VANCOTROUGH, VANCOPEAK, VANCORANDOM, GENTTROUGH, GENTPEAK, GENTRANDOM, TOBRATROUGH, TOBRAPEAK, TOBRARND, AMIKACINPEAK, AMIKACINTROU, AMIKACIN in the last 72 hours.   Microbiology: No results found for this or any previous visit (from the past 720 hour(s)).  Medical History: Past Medical History  Diagnosis Date  . GERD (gastroesophageal reflux disease)   . Glaucoma   . H/O renal calculi 2002 & 2006  . H/O hiatal hernia   . Headache(784.0)     migraine  . Pneumonia     dx 10-06-2014 per CXR--  on 10-27-2014 pt states finished antibiotic and denies cough or fever  . Hyperlipidemia   . History of MI (myocardial infarction)     10/ 2013 in setting of Septic Shock  . History of atrial fibrillation     10/ 2013  in setting of Septic Shock  . History of CHF (congestive heart failure)     10/ 2013 in setting of septic shock  . Sigmoid diverticulosis   . Nephrolithiasis     bilateral  . CKD (chronic kidney disease), stage III     nephrologist-  dr Florene Glen  . Right ureteral stone   . History of acute respiratory failure     10/ 2013  -- ventilated in setting septic shock  . Dysrhythmia     Afib in 2013 when she had  septic shock related to stone ureteral obstruction  . Myocardial infarction [facility])     Was reported in 2013 during hospitalization with septic shock  . Septic shock ([place]) 04/04/2012  . Sepsis ([place])   . Hypertension     medication removed from regimen due to low blood pressure   . Peripheral vascular disease ([place])   . S/P hemodialysis catheter insertion ([place]) 04/11/2015     right anterior chest , only used once   . History of nephrostomy 04/11/2015     currently inplace 04/28/2015   . History of blood transfusion 04/13/2015   . Low iron     hx  . Glaucoma   . Complication of anesthesia     use a little anesthesia , per patient MD states she quit breathing     Medications:   (Not in a hospital admission) Scheduled:  . famotidine  20 mg Oral Daily  . feeding supplement (NEPRO CARB STEADY)  237 mL Oral Q24H  . heparin  5,000 Units Subcutaneous 3 times per day  . latanoprost  1 drop Both Eyes QHS  . sodium bicarbonate  650 mg Oral BID  . sodium chloride  3 mL Intravenous Q12H  . timolol  1 drop Both Eyes q morning - 10a   Infusions:  . sodium chloride    . piperacillin-tazobactam (ZOSYN)  IV    . sodium chloride    . vancomycin 1,500 mg (07/04/15 0228)   Assessment: 34 yoF s/p nephrostomy change today now with T=101.4 and nausea.  Zosyn and Vancomycin per Rx for Sepsis.  Goal of Therapy:  Vancomycin trough level 15-20 mcg/ml  Plan:   Zosyn 3.375 Gm x1 then 2.25 Gm IV q8h  Vancomycin 1500mg  (x1 in ED) then 1Gm IV q48h  F/u SCr/cultures/levels as needed  Dorrene German 07/04/2015,2:46 AM

## 2015-07-05 DIAGNOSIS — I959 Hypotension, unspecified: Secondary | ICD-10-CM

## 2015-07-05 LAB — BASIC METABOLIC PANEL
Anion gap: 14 (ref 5–15)
BUN: 56 mg/dL — ABNORMAL HIGH (ref 6–20)
CO2: 13 mmol/L — ABNORMAL LOW (ref 22–32)
Calcium: 7.7 mg/dL — ABNORMAL LOW (ref 8.9–10.3)
Chloride: 110 mmol/L (ref 101–111)
Creatinine, Ser: 4.69 mg/dL — ABNORMAL HIGH (ref 0.44–1.00)
GFR calc Af Amer: 10 mL/min — ABNORMAL LOW (ref >60)
GFR calc non Af Amer: 9 mL/min — ABNORMAL LOW (ref >60)
Glucose, Bld: 113 mg/dL — ABNORMAL HIGH (ref 65–99)
Potassium: 4.5 mmol/L (ref 3.5–5.1)
Sodium: 137 mmol/L (ref 135–145)

## 2015-07-05 LAB — CBC
HCT: 36.2 % (ref 36.0–46.0)
Hemoglobin: 10.8 g/dL — ABNORMAL LOW (ref 12.0–15.0)
MCH: 27.3 pg (ref 26.0–34.0)
MCHC: 29.8 g/dL — ABNORMAL LOW (ref 30.0–36.0)
MCV: 91.6 fL (ref 78.0–100.0)
Platelets: 183 10*3/uL (ref 150–400)
RBC: 3.95 MIL/uL (ref 3.87–5.11)
RDW: 16.4 % — ABNORMAL HIGH (ref 11.5–15.5)
WBC: 11.9 10*3/uL — ABNORMAL HIGH (ref 4.0–10.5)

## 2015-07-05 LAB — GLUCOSE, CAPILLARY: Glucose-Capillary: 100 mg/dL — ABNORMAL HIGH (ref 65–99)

## 2015-07-05 MED ORDER — PHENOL 1.4 % MT LIQD
1.0000 | OROMUCOSAL | Status: DC | PRN
  Administered 2015-07-05: 1 via OROMUCOSAL
  Filled 2015-07-05: qty 177

## 2015-07-05 MED ORDER — HYDROCORTISONE NA SUCCINATE PF 100 MG IJ SOLR
50.0000 mg | Freq: Three times a day (TID) | INTRAMUSCULAR | Status: DC
  Administered 2015-07-05 – 2015-07-07 (×6): 50 mg via INTRAVENOUS
  Filled 2015-07-05 (×6): qty 2

## 2015-07-05 NOTE — Progress Notes (Signed)
Called report to [employer].  Irven [place], RN

## 2015-07-05 NOTE — Progress Notes (Signed)
Utilization review completed.  

## 2015-07-05 NOTE — Progress Notes (Signed)
[facility] PROGRESS NOTE  [name] [id number] DOB: [birth year] DOA: 07/03/2015  PCP: [name], MD  Brief HPI: 70 year old Caucasian female with a past medical history of nephrolithiasis, bilateral hydronephrosis, status post right nephrostomy, diastolic congestive heart failure, chronic kidney disease stage V, coronary artery disease who has had multiple hospitalizations in the past few months due to sepsis associated with her nephrostomy. She had her nephrostomy tube changed on January 13. This was followed by onset of fever, nausea and vomiting and flank pain. She presented back to the emergency department and was found to have sepsis. She was subsequently hospitalized.  Past medical history:  Past Medical History  Diagnosis Date  . GERD (gastroesophageal reflux disease)   . Glaucoma   . H/O renal calculi 2002 & 2006  . H/O hiatal hernia   . Headache(784.0)     migraine  . Pneumonia     dx 10-06-2014 per CXR--  on 10-27-2014 pt states finished antibiotic and denies cough or fever  . Hyperlipidemia   . History of MI (myocardial infarction)     10/ 2013 in setting of Septic Shock  . History of atrial fibrillation     10/ 2013  in setting of Septic Shock  . History of CHF (congestive heart failure)     10/ 2013 in setting of septic shock  . Sigmoid diverticulosis   . Nephrolithiasis     bilateral  . CKD (chronic kidney disease), stage III     nephrologist-  dr Florene Glen  . Right ureteral stone   . History of acute respiratory failure     10/ 2013  -- ventilated in setting septic shock  . Dysrhythmia     Afib in 2013 when she had septic shock related to stone ureteral obstruction  . Myocardial infarction [facility])     Was reported in 2013 during hospitalization with septic shock  . Septic shock ([place]) 04/04/2012  . Sepsis ([place])   . Hypertension     medication removed from regimen due to low blood pressure   . Peripheral vascular disease ([place])   . S/P  hemodialysis catheter insertion ([place]) 04/11/2015     right anterior chest , only used once   . History of nephrostomy 04/11/2015     currently inplace 04/28/2015   . History of blood transfusion 04/13/2015   . Low iron     hx  . Glaucoma   . Complication of anesthesia     use a little anesthesia , per patient MD states she quit breathing     Consultants: None So far  Procedures: None  Antibiotics: Vancomycin and Zosyn  Subjective: Patient feels better this morning. Pain is improved. Denies any nausea or vomiting.   Objective: Vital Signs  Filed Vitals:   07/05/15 0500 07/05/15 0600 07/05/15 0629 07/05/15 0700  BP:   122/67 109/57  Pulse:      Temp:      TempSrc:      Resp: 10 12 20 15   Height:  5\' 3"  (1.6 m)    Weight:  85.8 kg (189 lb 2.5 oz)    SpO2: 100% 99%  100%    Intake/Output Summary (Last 24 hours) at 07/05/15 0804 Last data filed at 07/05/15 0700  Gross per 24 hour  Intake 2298.33 ml  Output   1450 ml  Net 848.33 ml   Filed Weights   07/05/15 0600  Weight: 85.8 kg (189 lb 2.5 oz)    General appearance: alert,  cooperative, appears stated age and no distress Resp: Improved air entry at the bases. No crackles, wheezing or rhonchi. Right-sided dialysis catheter noted. She has a fistula in the left arm. Cardio: S1, S2 is tachycardic. Regular. No S3, S4. No rubs, murmurs, or bruit. No pedal edema. GI: Abdomen is soft. Mildly tender diffusely without any rebound, rigidity or guarding. There is a nephrostomy tube in the right. No erythema is noted. Bowel sounds are present. No masses or organomegaly. Neurologic: Alert and oriented 3. No focal neurological deficits are noted.  Lab Results:  Basic Metabolic Panel:  Recent Labs Lab 07/03/15 2358 07/04/15 1014 07/05/15 0418  NA 133* 137 137  K 4.6 4.0 4.5  CL 104 114* 110  CO2 14* 13* 13*  GLUCOSE 163* 111* 113*  BUN 64* 57* 56*  CREATININE 5.12* 4.71* 4.69*  CALCIUM 8.9 7.8* 7.7*   Liver  Function Tests:  Recent Labs Lab 07/03/15 2358  AST 18  ALT 13*  ALKPHOS 88  BILITOT 0.6  PROT 7.1  ALBUMIN 3.4*   CBC:  Recent Labs Lab 07/02/15 1102 07/03/15 2358 07/04/15 1014 07/05/15 0418  WBC  --  18.4* 16.9* 11.9*  NEUTROABS  --  17.4*  --   --   HGB 12.5 13.7 10.5* 10.8*  HCT  --  43.2 34.0* 36.2  MCV  --  86.6 88.8 91.6  PLT  --  252 208 183   BNP (last 3 results)  Recent Labs  02/26/15 0530 05/23/15 0602 07/04/15 1014  BNP 35.3 37.2 183.0*    CBG:  Recent Labs Lab 07/04/15 0924  GLUCAP 122*    Recent Results (from the past 240 hour(s))  MRSA PCR Screening     Status: Abnormal   Collection Time: 07/04/15  3:22 PM  Result Value Ref Range Status   MRSA by PCR POSITIVE (A) NEGATIVE Final    Comment:        The GeneXpert MRSA Assay (FDA approved for NASAL specimens only), is one component of a comprehensive MRSA colonization surveillance program. It is not intended to diagnose MRSA infection nor to guide or monitor treatment for MRSA infections. RESULT CALLED TO, READ BACK BY AND VERIFIED WITH: TUSHMAN,D RN AT 1803 ON 1.14.17 BY EPPERSON,S       Studies/Results: Dg Chest 2 View  07/04/2015  CLINICAL DATA:  70 year old female with fever. New nephrostomy tube placement. EXAM: CHEST  2 VIEW COMPARISON:  Radiograph dated 05/23/2015 FINDINGS: The right-sided dialysis catheter with tip at the cavoatrial junction. There is no focal consolidation, pleural effusion, or pneumothorax. There are bilateral central and hilar vascular prominence. Retrocardiac air correspond to the hiatal hernia seen on the CT. The cardiac silhouette is within normal limits. The osseous structures appear unremarkable. IMPRESSION: No focal consolidation. Electronically Signed   By: Anner Crete M.D.   On: 07/04/2015 00:53   Ir Nephrostomy Exchange Right  07/03/2015  CLINICAL DATA:  CHRONIC RIGHT HYDRONEPHROSIS, CHRONIC INDWELLING EXTERNAL RIGHT NEPHROSTOMY, BILATERAL  INTERNAL URETERAL STENTS EXAM: FLUOROSCOPIC RIGHT 10 FRENCH NEPHROSTOMY EXCHANGE Date:  1/13/20171/13/2017 11:58 am Radiologist:  M. Daryll Brod, MD Guidance:  Fluoroscopic FLUOROSCOPY TIME:  1.4 minutes, 18.6 mGy MEDICATIONS AND MEDICAL HISTORY: 1% lidocaine local ANESTHESIA/SEDATION: None. CONTRAST:  37mL OMNIPAQUE IOHEXOL 300 MG/ML  SOLN COMPLICATIONS: None immediate PROCEDURE: Informed consent was obtained from the patient following explanation of the procedure, risks, benefits and alternatives. The patient understands, agrees and consents for the procedure. All questions were addressed. A time out was performed. Maximal  barrier sterile technique utilized including caps, mask, sterile gowns, sterile gloves, large sterile drape, hand hygiene, and Betadine. Under sterile conditions and local anesthesia, the existing right nephrostomy catheter was injected with contrast confirming position in the renal pelvis. Stable ureteral stents. Nephrostomy catheter was cut and removed over a Bentson guidewire. New 10 French nephrostomy was advanced with the retention loop formed in the renal pelvis. Position confirmed with fluoroscopy. Images obtained for documentation. Catheter secured with a Prolene suture and connected to external gravity drainage. Sterile dressing applied. No immediate complication. IMPRESSION: Successful fluoroscopic exchange of the 10 French right nephrostomy Electronically Signed   By: Jerilynn Mages.  Shick M.D.   On: 07/03/2015 14:17    Medications:  Scheduled: . acidophilus  2 capsule Oral Daily  . famotidine  20 mg Oral Daily  . feeding supplement (NEPRO CARB STEADY)  237 mL Oral Daily  . heparin  5,000 Units Subcutaneous 3 times per day  . hydrocortisone sod succinate (SOLU-CORTEF) inj  50 mg Intravenous Q8H  . latanoprost  1 drop Both Eyes QHS  . mupirocin ointment  1 application Nasal BID  . piperacillin-tazobactam (ZOSYN)  IV  2.25 g Intravenous Q8H  . sodium bicarbonate  1,300 mg Oral TID  .  sodium chloride  500 mL Intravenous Once  . sodium chloride  3 mL Intravenous Q12H  . timolol  1 drop Both Eyes q morning - 10a  . [START ON 07/06/2015] vancomycin  1,000 mg Intravenous Q48H   Continuous: . sodium chloride 100 mL/hr at 07/04/15 1459   [id number], dextromethorphan, HYDROcodone-acetaminophen, HYDROmorphone (DILAUDID) injection, meperidine (DEMEROL) injection, morphine injection, ondansetron (ZOFRAN) IV, ondansetron (ZOFRAN) IV, phenol, sodium chloride  Assessment/Plan:  Principal Problem:   Sepsis ([place]) Active Problems:   Nephrolithiasis   Essential hypertension, benign   Acute pyelonephritis   CKD (chronic kidney disease), stage V (HCC)   Bilateral hydronephrosis   CAD (coronary artery disease)   Chronic diastolic CHF (congestive heart failure) ([place])   Essential hypertension   Hydronephrosis with urinary obstruction due to renal calculus    Sepsis, likely from urinary source, unspecified organism Patient was septic with leukocytosis, fever, tachycardia and tachypnea. Lactic acid was normal. Blood pressure noted to be low and patient was given IV fluids. She was also given stress dose steroids. Cortisol level was checked and is found to be normal. Will start tapering down her steroids. Continue with vancomycin and Zosyn. She hasn't had any further episodes of diarrhea. C. difficile is extremely unlikely. Will cancel stool studies. Blood culture and urine culture data is pending.  Hx of Nephrolithiasis, bilateral hydronephrosis s/p R nephrostomy, which was changed by IR 07/03/15. Pain management. Her urologist is Dr. Gaynelle Arabian. Patient is noted to have good output from her nephrostomy.   CKD (chronic kidney disease), stage V , with non-anion gap metabolic acidosis Baseline creatinine 4.87-5.23. Renal function appears to be stable. Continue IV fluids. Monitor outputs. Bicarbonate noted to be low but stable. Her dose of oral bicarbonate was increased. She is  followed by Dr. Florene Glen. She has a dialysis catheter in the right chest. She has a fistula placed in the left arm. No erythema noted around the dialysis catheter site. Low threshold to involve nephrology if her renal function gets worse.  Hypotension  Blood pressures have improved. Not noted to be on any blood pressure lowering agents at home. Blood pressure was low likely due to hypovolemia. Lactic acid level is reassuringly normal. Continue IV hydration. Stress dose steroids were initiated and will  be tapered.   CAD (coronary artery disease) Appears to be stable. Continue to monitor.   GERD Pepcid   Chronic diastolic CHF (congestive heart failure) 2-D echo on 10/23/14 showed EF 65-70 percent. Patient is not on diuretics at home. Patient does not have leg edema. CHF is compensated. Monitor volume status closely when patient is receiving fluid resuscitation.  DVT Prophylaxis: Subcutaneous heparin    Code Status: DO NOT RESUSCITATE  Family Communication: Discussed with the patient  Disposition Plan: Patient feels better this morning. Her vital signs have improved. Culture data is pending. She could be transferred to telemetry.    LOS: 1 day   [facility] Pager [phone] 07/05/2015, 8:04 AM  If 7PM-7AM, please contact night-coverage at [email or web address], password [facility]

## 2015-07-05 NOTE — Progress Notes (Signed)
mrsa positive standing orders initiated with the exception of daily  chlorhexidine wipes d/t allergy

## 2015-07-05 NOTE — Evaluation (Signed)
Physical Therapy Evaluation Patient Details Name: [name] MRN: [id number] DOB: 1946/01/19 Today's Date: 07/05/2015   History of Present Illness  [name] is a 70 y.o. female with PMH of kidney stone, bilateral hydronephrosis, s/p of right-sided nephrostomy, diastolic congestive heart failure, chronic kidney disease-V, CAD, myocardial infarction, PVD, glaucoma, GERD, hypertension, hyperlipidemia, C. difficile colitis, afib;  who presents with fever, nausea and right flank pain.  Clinical Impression  Patient evaluated by Physical Therapy with no further acute PT needs identified. All education has been completed and the patient has no further questions.  See below for any follow-up Physical Therapy or equipment needs. PT is signing off. Thank you for this referral.     Follow Up Recommendations No PT follow up    Equipment Recommendations  None recommended by PT    Recommendations for Other Services       Precautions / Restrictions        Mobility  Bed Mobility Overal bed mobility: Needs Assistance Bed Mobility: Supine to Sit;Sit to Supine     Supine to sit: Supervision Sit to supine: Min guard;Min assist   General bed mobility comments: light assist to bring RLE onto bed d/t pain, supervision for safety (if not ICU bed would likely be  mod I)  Transfers Overall transfer level: Needs assistance   Transfers: Sit to/from Stand Sit to Stand: Supervision         General transfer comment: for lines, cues to go slower than her baseline for safety  Ambulation/Gait Ambulation/Gait assistance: Supervision;Min guard Ambulation Distance (Feet): 180 Feet   Gait Pattern/deviations: Step-through pattern     General Gait Details: for safety and line management, pt uses IV pole for support part of distance; VSS with HR 86-105, Sats 91-100% on RA  Stairs            Wheelchair Mobility    Modified Rankin (Stroke Patients Only)       Balance Overall  balance assessment: No apparent balance deficits (not formally assessed)                                           Pertinent Vitals/Pain Pain Assessment: Faces Faces Pain Scale: Hurts even more Pain Location: right flank with transitional movements Pain Descriptors / Indicators: Sore Pain Intervention(s): Limited activity within patient's tolerance;Monitored during session    Home Living Family/patient expects to be discharged to:: Private residence Living Arrangements: Children Available Help at Discharge: Family Type of Home: House Home Access: Stairs to enter Entrance Stairs-Rails: Right Entrance Stairs-Number of Steps: 4 Home Layout: One level Home Equipment: Cane - single point;Walker - 4 wheels;Tub bench      Prior Function Level of Independence: Independent               Hand Dominance        Extremity/Trunk Assessment   Upper Extremity Assessment: Defer to OT evaluation           Lower Extremity Assessment: Overall WFL for tasks assessed         Communication   Communication: No difficulties  Cognition Arousal/Alertness: Awake/alert Behavior During Therapy: WFL for tasks assessed/performed Overall Cognitive Status: Within Functional Limits for tasks assessed                      General Comments      Exercises  Assessment/Plan    PT Assessment Patent does not need any further PT services  PT Diagnosis Difficulty walking   PT Problem List    PT Treatment Interventions     PT Goals (Current goals can be found in the Care Plan section) Acute Rehab PT Goals Patient Stated Goal: to get better and stay out of hospital PT Goal Formulation: All assessment and education complete, DC therapy    Frequency     Barriers to discharge        Co-evaluation               End of Session Equipment Utilized During Treatment: Gait belt Activity Tolerance: Patient tolerated treatment well Patient left: in  bed;with call bell/phone within reach           Time: 1419-1443 PT Time Calculation (min) (ACUTE ONLY): 24 min   Charges:   PT Evaluation $PT Eval Low Complexity: 1 Procedure PT Treatments $Gait Training: 8-22 mins   PT G CodesKenyon Ana 07/18/15, 3:33 PM

## 2015-07-06 LAB — CBC
HCT: 32.4 % — ABNORMAL LOW (ref 36.0–46.0)
Hemoglobin: 10.2 g/dL — ABNORMAL LOW (ref 12.0–15.0)
MCH: 27.9 pg (ref 26.0–34.0)
MCHC: 31.5 g/dL (ref 30.0–36.0)
MCV: 88.8 fL (ref 78.0–100.0)
Platelets: 216 10*3/uL (ref 150–400)
RBC: 3.65 MIL/uL — ABNORMAL LOW (ref 3.87–5.11)
RDW: 16.6 % — ABNORMAL HIGH (ref 11.5–15.5)
WBC: 10.5 10*3/uL (ref 4.0–10.5)

## 2015-07-06 LAB — URINE CULTURE: Culture: 10000

## 2015-07-06 LAB — BASIC METABOLIC PANEL
Anion gap: 8 (ref 5–15)
BUN: 63 mg/dL — ABNORMAL HIGH (ref 6–20)
CO2: 17 mmol/L — ABNORMAL LOW (ref 22–32)
Calcium: 8.2 mg/dL — ABNORMAL LOW (ref 8.9–10.3)
Chloride: 116 mmol/L — ABNORMAL HIGH (ref 101–111)
Creatinine, Ser: 4.61 mg/dL — ABNORMAL HIGH (ref 0.44–1.00)
GFR calc Af Amer: 10 mL/min — ABNORMAL LOW (ref >60)
GFR calc non Af Amer: 9 mL/min — ABNORMAL LOW (ref >60)
Glucose, Bld: 118 mg/dL — ABNORMAL HIGH (ref 65–99)
Potassium: 4 mmol/L (ref 3.5–5.1)
Sodium: 141 mmol/L (ref 135–145)

## 2015-07-06 LAB — GLUCOSE, CAPILLARY: Glucose-Capillary: 130 mg/dL — ABNORMAL HIGH (ref 65–99)

## 2015-07-06 MED ORDER — LOPERAMIDE HCL 2 MG PO CAPS
2.0000 mg | ORAL_CAPSULE | Freq: Once | ORAL | Status: AC
  Administered 2015-07-06: 2 mg via ORAL
  Filled 2015-07-06: qty 1

## 2015-07-06 NOTE — Progress Notes (Signed)
Patient ID: [name], female   DOB: 07/22/1945, 70 y.o.   MRN: [id number] S/p rt PCN exchange 1/13 as OP ; admitted 1/14 with fever,N/V , flank pain; currently improved but still sore at PCN site; AF; WBC nl; creat 4.61(CKD),  urine cx- few yeast; blood cx neg to date; rt PCN intact, insertion site ok, mild- mod tender, stitch intact, small amt drainage on gauze pad, output 350 cc hazy light yellow urine. Suture at PCN insertion site may be causing some of pt's discomfort; if persists will plan to remove suture and place statlock device to secure PCN on 1/17.

## 2015-07-06 NOTE — Evaluation (Addendum)
Occupational Therapy Evaluation AND Discharge  Patient Details Name: [name] MRN: [id number] DOB: [birth year] Today's Date: 07/06/2015    History of Present Illness SYMIA DETEMPLE is a 70 y.o. female with PMH of kidney stone, bilateral hydronephrosis, s/p of right-sided nephrostomy, diastolic congestive heart failure, chronic kidney disease-V, CAD, myocardial infarction, PVD, glaucoma, GERD, hypertension, hyperlipidemia, C. difficile colitis, afib;  who presents with fever, nausea and right flank pain.   Clinical Impression   Patient admitted with above. Patient independent PTA. Patient currently functioning at an overall mod I level for increased time due to increased pain.  No additional OT needs identified, D/C from acute OT services and no additional follow-up OT needs at this time. All appropriate education provided to patient. Please re-order OT if needed.      Follow Up Recommendations  No OT follow up;Supervision - Intermittent    Equipment Recommendations  None recommended by OT (pt refused 3-n-1)    Recommendations for Other Services  None at this time   Precautions / Restrictions Precautions Precautions: Fall Restrictions Weight Bearing Restrictions: No    Mobility Bed Mobility Overal bed mobility: Modified Independent General bed mobility comments: increased time due to pain  Transfers Overall transfer level: Modified independent General transfer comment: increased time due to pain    Balance Overall balance assessment: No apparent balance deficits (not formally assessed)    ADL Overall ADL's : Modified independent General ADL Comments: taking increased time due to increased pain. Encouraged patient to be getting up for all meals and walking hallway with nursing staff.     Pertinent Vitals/Pain Pain Assessment: 0-10 Pain Score: 7  Pain Location: right flank with transitional movements  Pain Descriptors / Indicators: Sore Pain Intervention(s): Limited  activity within patient's tolerance;Monitored during session     Hand Dominance Right   Extremity/Trunk Assessment Upper Extremity Assessment Upper Extremity Assessment: Overall WFL for tasks assessed   Lower Extremity Assessment Lower Extremity Assessment: Defer to PT evaluation   Cervical / Trunk Assessment Cervical / Trunk Assessment: Normal   Communication Communication Communication: No difficulties   Cognition Arousal/Alertness: Awake/alert Behavior During Therapy: WFL for tasks assessed/performed Overall Cognitive Status: Within Functional Limits for tasks assessed              Home Living Family/patient expects to be discharged to:: Private residence Living Arrangements: Children (daughter) Available Help at Discharge: Family;Available PRN/intermittently Type of Home: House Home Access: Stairs to enter [employer] of Steps: 4 Entrance Stairs-Rails: Right Home Layout: One level     Bathroom Shower/Tub: Teacher, early years/pre: Standard     Home Equipment: Cane - single point;Walker - 4 wheels;Tub bench   Prior Functioning/Environment Level of Independence: Independent     OT Diagnosis: Generalized weakness;Acute pain   OT Problem List:   N/a, no acute OT needs identified at this time     OT Treatment/Interventions:   N/a, no acute OT needs identified at this time     OT Goals(Current goals can be found in the care plan section) Acute Rehab OT Goals Patient Stated Goal: decrease pain OT Goal Formulation: All assessment and education complete, DC therapy  OT Frequency:   N/a, no acute OT needs identified at this time     Barriers to D/C:  None known at this time    End of Session Activity Tolerance: Patient limited by pain Patient left: in bed;with call bell/phone within reach   Time: [id number] OT Time Calculation (min): 13  min Charges:  OT General Charges $OT Visit: 1 Procedure OT Evaluation $OT Eval Moderate Complexity: 1  Procedure  Chrys Racer , MS, OTR/L, CLT Pager: [phone]  07/06/2015, 4:08 PM

## 2015-07-06 NOTE — Progress Notes (Signed)
[facility] PROGRESS NOTE  [name] [id number] DOB: [birth year] DOA: 07/03/2015  PCP: [name], MD  Brief HPI: 70 year old Caucasian female with a past medical history of nephrolithiasis, bilateral hydronephrosis, status post right nephrostomy, diastolic congestive heart failure, chronic kidney disease stage V, coronary artery disease who has had multiple hospitalizations in the past few months due to sepsis associated with her nephrostomy. She had her nephrostomy tube changed on January 13. This was followed by onset of fever, nausea and vomiting and flank pain. She presented back to the emergency department and was found to have sepsis. She was subsequently hospitalized.  Past medical history:  Past Medical History  Diagnosis Date  . GERD (gastroesophageal reflux disease)   . Glaucoma   . H/O renal calculi 2002 & 2006  . H/O hiatal hernia   . Headache(784.0)     migraine  . Pneumonia     dx 10-06-2014 per CXR--  on 10-27-2014 pt states finished antibiotic and denies cough or fever  . Hyperlipidemia   . History of MI (myocardial infarction)     10/ 2013 in setting of Septic Shock  . History of atrial fibrillation     10/ 2013  in setting of Septic Shock  . History of CHF (congestive heart failure)     10/ 2013 in setting of septic shock  . Sigmoid diverticulosis   . Nephrolithiasis     bilateral  . CKD (chronic kidney disease), stage III     nephrologist-  dr Florene Glen  . Right ureteral stone   . History of acute respiratory failure     10/ 2013  -- ventilated in setting septic shock  . Dysrhythmia     Afib in 2013 when she had septic shock related to stone ureteral obstruction  . Myocardial infarction [facility])     Was reported in 2013 during hospitalization with septic shock  . Septic shock ([place]) 04/04/2012  . Sepsis ([place])   . Hypertension     medication removed from regimen due to low blood pressure   . Peripheral vascular disease ([place])   . S/P  hemodialysis catheter insertion ([place]) 04/11/2015     right anterior chest , only used once   . History of nephrostomy 04/11/2015     currently inplace 04/28/2015   . History of blood transfusion 04/13/2015   . Low iron     hx  . Glaucoma   . Complication of anesthesia     use a little anesthesia , per patient MD states she quit breathing     Consultants: None So far  Procedures: None  Antibiotics: Vancomycin and Zosyn  Subjective: Patient continues to feel well. Pain is improved. Denies any nausea, vomiting.   Objective: Vital Signs  Filed Vitals:   07/05/15 1600 07/05/15 1738 07/05/15 2116 07/06/15 0526  BP: 121/58 132/69 120/68 116/66  Pulse:  71 81 77  Temp:  97.6 F (36.4 C) 97.9 F (36.6 C) 97.4 F (36.3 C)  TempSrc:  Oral Oral Oral  Resp: 18 16 18 18   Height:  5\' 3"  (1.6 m)    Weight:  87.8 kg (193 lb 9 oz)  89.4 kg (197 lb 1.5 oz)  SpO2: 99% 100% 100% 99%    Intake/Output Summary (Last 24 hours) at 07/06/15 1002 Last data filed at 07/06/15 0931  Gross per 24 hour  Intake   1925 ml  Output   1625 ml  Net    300 ml   [employer]  07/05/15 0600 07/05/15 1738 07/06/15 0526  Weight: 85.8 kg (189 lb 2.5 oz) 87.8 kg (193 lb 9 oz) 89.4 kg (197 lb 1.5 oz)    General appearance: alert, cooperative, appears stated age and no distress Resp: Improved air entry at the bases. No crackles, wheezing or rhonchi. Right-sided dialysis catheter noted. She has a fistula in the left arm. Cardio: S1, S2 is normal. Regular. No S3, S4. No rubs, murmurs, or bruit. No pedal edema. GI: Abdomen is soft. Nontender. There is a nephrostomy tube in the right. No erythema is noted. Bowel sounds are present. No masses or organomegaly. Neurologic: Alert and oriented 3. No focal neurological deficits are noted.  Lab Results:  Basic Metabolic Panel:  Recent Labs Lab 07/03/15 2358 07/04/15 1014 07/05/15 0418 07/06/15 0435  NA 133* 137 137 141  K 4.6 4.0 4.5 4.0  CL 104 114*  110 116*  CO2 14* 13* 13* 17*  GLUCOSE 163* 111* 113* 118*  BUN 64* 57* 56* 63*  CREATININE 5.12* 4.71* 4.69* 4.61*  CALCIUM 8.9 7.8* 7.7* 8.2*   Liver Function Tests:  Recent Labs Lab 07/03/15 2358  AST 18  ALT 13*  ALKPHOS 88  BILITOT 0.6  PROT 7.1  ALBUMIN 3.4*   CBC:  Recent Labs Lab 07/02/15 1102 07/03/15 2358 07/04/15 1014 07/05/15 0418 07/06/15 0435  WBC  --  18.4* 16.9* 11.9* 10.5  NEUTROABS  --  17.4*  --   --   --   HGB 12.5 13.7 10.5* 10.8* 10.2*  HCT  --  43.2 34.0* 36.2 32.4*  MCV  --  86.6 88.8 91.6 88.8  PLT  --  252 208 183 216   BNP (last 3 results)  Recent Labs  02/26/15 0530 05/23/15 0602 07/04/15 1014  BNP 35.3 37.2 183.0*    CBG:  Recent Labs Lab 07/04/15 0924 07/05/15 0827 07/06/15 0738  GLUCAP 122* 100* 130*    Recent Results (from the past 240 hour(s))  Blood culture (routine x 2)     Status: None (Preliminary result)   Collection Time: 07/04/15  1:40 AM  Result Value Ref Range Status   Specimen Description BLOOD RIGHT ARM  Final   Special Requests BOTTLES DRAWN AEROBIC AND ANAEROBIC 5ML  Final   Culture   Final    NO GROWTH 1 DAY Performed at [facility]    Report Status PENDING  Incomplete  Blood culture (routine x 2)     Status: None (Preliminary result)   Collection Time: 07/04/15  2:05 AM  Result Value Ref Range Status   Specimen Description BLOOD RIGHT HAND  Final   Special Requests BOTTLES DRAWN AEROBIC AND ANAEROBIC 5CC  Final   Culture   Final    NO GROWTH 1 DAY Performed at [facility]    Report Status PENDING  Incomplete  Urine culture     Status: None (Preliminary result)   Collection Time: 07/04/15  3:03 AM  Result Value Ref Range Status   Specimen Description URINE, RANDOM  Final   Special Requests NONE  Final   Culture   Final    TOO YOUNG TO READ Performed at [facility]    Report Status PENDING  Incomplete  MRSA PCR Screening     Status: Abnormal   Collection Time:  07/04/15  3:22 PM  Result Value Ref Range Status   MRSA by PCR POSITIVE (A) NEGATIVE Final    Comment:        The  GeneXpert MRSA Assay (FDA approved for NASAL specimens only), is one component of a comprehensive MRSA colonization surveillance program. It is not intended to diagnose MRSA infection nor to guide or monitor treatment for MRSA infections. RESULT CALLED TO, READ BACK BY AND VERIFIED WITH: TUSHMAN,D RN AT 1803 ON 1.14.17 BY EPPERSON,S       Studies/Results: No results found.  Medications:  Scheduled: . acidophilus  2 capsule Oral Daily  . famotidine  20 mg Oral Daily  . feeding supplement (NEPRO CARB STEADY)  237 mL Oral Daily  . heparin  5,000 Units Subcutaneous 3 times per day  . hydrocortisone sod succinate (SOLU-CORTEF) inj  50 mg Intravenous Q8H  . latanoprost  1 drop Both Eyes QHS  . loperamide  2 mg Oral Once  . mupirocin ointment  1 application Nasal BID  . piperacillin-tazobactam (ZOSYN)  IV  2.25 g Intravenous Q8H  . sodium bicarbonate  1,300 mg Oral TID  . sodium chloride  500 mL Intravenous Once  . sodium chloride  3 mL Intravenous Q12H  . timolol  1 drop Both Eyes q morning - 10a  . vancomycin  1,000 mg Intravenous Q48H   Continuous: . sodium chloride 75 mL/hr at 07/06/15 0216   [id number], dextromethorphan, HYDROcodone-acetaminophen, morphine injection, ondansetron (ZOFRAN) IV, phenol, sodium chloride  Assessment/Plan:  Principal Problem:   Sepsis ([place]) Active Problems:   Nephrolithiasis   Essential hypertension, benign   Acute pyelonephritis   CKD (chronic kidney disease), stage V (HCC)   Bilateral hydronephrosis   CAD (coronary artery disease)   Chronic diastolic CHF (congestive heart failure) ([place])   Essential hypertension   Hydronephrosis with urinary obstruction due to renal calculus    Sepsis, likely from urinary source, unspecified organism Patient was septic with leukocytosis, fever, tachycardia and tachypnea. Lactic  acid was normal. Blood pressure noted to be low and patient was given IV fluids. She was also given stress dose steroids. Cortisol level was checked and is found to be normal. Continue to taper steroids.  Continue with vancomycin and Zosyn. Transition to oral antibiotics or at least de-escalate antibiotics tomorrow depending on culture data. She did have 2 episodes of loose stools over the last 24 hours. This is most likely secondary to antibiotics. She'll be given a dose of Imodium.   Hx of Nephrolithiasis, bilateral hydronephrosis s/p R nephrostomy, which was changed by IR 07/03/15. Pain management. Her urologist is Dr. Gaynelle Arabian. Patient is noted to have good output from her nephrostomy.   CKD (chronic kidney disease), stage V , with non-anion gap metabolic acidosis Baseline creatinine 4.87-5.23. Renal function appears to be stable. Cut back on IV fluids. Bicarbonate is noted to be improved today. Continue oral sodium bicarbonate. She is followed by Dr. Florene Glen. She has an appointment with him in 1-2 weeks. She has a dialysis catheter in the right chest. She has a fistula placed in the left arm. No erythema noted around the dialysis catheter site. Low threshold to involve nephrology if her renal function gets worse.   Hypotension  Blood pressures have improved. Not noted to be on any blood pressure lowering agents at home. Blood pressure was low likely due to hypovolemia. Lactic acid level is reassuringly normal. Stress dose steroids were initiated and will be tapered.   CAD (coronary artery disease) Appears to be stable. Continue to monitor.   GERD Pepcid   Chronic diastolic CHF (congestive heart failure) 2-D echo on 10/23/14 showed EF 65-70 percent. Patient is not on diuretics at  home. Patient does not have leg edema. CHF is compensated.   DVT Prophylaxis: Subcutaneous heparin    Code Status: DO NOT RESUSCITATE  Family Communication: Discussed with the patient  Disposition Plan: Patient  continues to improve. Culture data is pending. Anticipate de-escalating antibiotics tomorrow. Continue to mobilize. Interventional radiology was notified regarding her admission.     LOS: 2 days   [place] [facility] Pager [phone] 07/06/2015, 10:02 AM  If 7PM-7AM, please contact night-coverage at [email or web address], password [facility]

## 2015-07-07 DIAGNOSIS — A0472 Enterocolitis due to Clostridium difficile, not specified as recurrent: Secondary | ICD-10-CM | POA: Diagnosis not present

## 2015-07-07 LAB — CBC
HCT: 40.3 % (ref 36.0–46.0)
Hemoglobin: 12.3 g/dL (ref 12.0–15.0)
MCH: 27 pg (ref 26.0–34.0)
MCHC: 30.5 g/dL (ref 30.0–36.0)
MCV: 88.6 fL (ref 78.0–100.0)
Platelets: 262 10*3/uL (ref 150–400)
RBC: 4.55 MIL/uL (ref 3.87–5.11)
RDW: 16.7 % — ABNORMAL HIGH (ref 11.5–15.5)
WBC: 8.7 10*3/uL (ref 4.0–10.5)

## 2015-07-07 LAB — BASIC METABOLIC PANEL
Anion gap: 10 (ref 5–15)
BUN: 60 mg/dL — ABNORMAL HIGH (ref 6–20)
CO2: 18 mmol/L — ABNORMAL LOW (ref 22–32)
Calcium: 9.1 mg/dL (ref 8.9–10.3)
Chloride: 115 mmol/L — ABNORMAL HIGH (ref 101–111)
Creatinine, Ser: 4.45 mg/dL — ABNORMAL HIGH (ref 0.44–1.00)
GFR calc Af Amer: 11 mL/min — ABNORMAL LOW (ref >60)
GFR calc non Af Amer: 9 mL/min — ABNORMAL LOW (ref >60)
Glucose, Bld: 111 mg/dL — ABNORMAL HIGH (ref 65–99)
Potassium: 4.1 mmol/L (ref 3.5–5.1)
Sodium: 143 mmol/L (ref 135–145)

## 2015-07-07 LAB — GLUCOSE, CAPILLARY: Glucose-Capillary: 105 mg/dL — ABNORMAL HIGH (ref 65–99)

## 2015-07-07 LAB — C DIFFICILE QUICK SCREEN W PCR REFLEX
C Diff antigen: POSITIVE — AB
C Diff toxin: NEGATIVE

## 2015-07-07 MED ORDER — HYDROCORTISONE NA SUCCINATE PF 100 MG IJ SOLR
50.0000 mg | Freq: Two times a day (BID) | INTRAMUSCULAR | Status: AC
  Administered 2015-07-07: 50 mg via INTRAVENOUS
  Filled 2015-07-07: qty 2

## 2015-07-07 MED ORDER — VANCOMYCIN 50 MG/ML ORAL SOLUTION
125.0000 mg | Freq: Four times a day (QID) | ORAL | Status: DC
  Administered 2015-07-07 – 2015-07-08 (×5): 125 mg via ORAL
  Filled 2015-07-07 (×8): qty 2.5

## 2015-07-07 MED ORDER — HYDROCORTISONE NA SUCCINATE PF 100 MG IJ SOLR
50.0000 mg | Freq: Every day | INTRAMUSCULAR | Status: AC
  Administered 2015-07-08: 50 mg via INTRAVENOUS
  Filled 2015-07-07: qty 2

## 2015-07-07 MED ORDER — CEFPODOXIME PROXETIL 200 MG PO TABS
100.0000 mg | ORAL_TABLET | ORAL | Status: DC
  Administered 2015-07-07 – 2015-07-08 (×2): 100 mg via ORAL
  Filled 2015-07-07 (×2): qty 1

## 2015-07-07 NOTE — Progress Notes (Signed)
Pharmacy Antibiotic Follow-up Note  [name] is a 70 y.o. year-old female admitted on 07/03/2015.  The patient is currently on day #4 of vancomycin/zosyn for sepsis from possible pyelonenephritis.  Assessment/Plan:  Orders to change to PO cefpodixime 200mg  daily, this is appropriate dose for renal function.  Do no anticipate further adjustment will be needed and pharmacy will sign-off  Temp (24hrs), Avg:97.4 F (36.3 C), Min:97.3 F (36.3 C), Max:97.6 F (36.4 C)   Recent Labs Lab 07/03/15 2358 07/04/15 1014 07/05/15 0418 07/06/15 0435 07/07/15 0505  WBC 18.4* 16.9* 11.9* 10.5 8.7    Recent Labs Lab 07/03/15 2358 07/04/15 1014 07/05/15 0418 07/06/15 0435 07/07/15 0505  CREATININE 5.12* 4.71* 4.69* 4.61* 4.45*   Estimated Creatinine Clearance: 12.4 mL/min (by C-G formula based on Cr of 4.45).    Allergies  Allergen Reactions  . Vicodin [Hydrocodone-Acetaminophen] Nausea And Vomiting  . Chlorhexidine Rash    Sunburn    rash  . Percocet [Oxycodone-Acetaminophen] Nausea And Vomiting    Antimicrobials this admission: 1/14 >>zosyn >> 1/17  1/14 >>vancomycin >> 1/17  1/17 >> cefpodoxime >>   Levels/dose changes this admission:  Microbiology results: 1/14 blood x2: collected  1/14 urine: 10K yeast, FINAL  1/14 MRSA PCR +  1/17 C. Diff: Ag +, toxin -  05/22/15 urine: MRSA   Thank you for allowing pharmacy to be a part of this patient's care.  Clovis Riley PharmD 07/07/2015 9:57 AM

## 2015-07-07 NOTE — Progress Notes (Signed)
[facility] PROGRESS NOTE  [name] [id number] DOB: [birth year] DOA: 07/03/2015  PCP: [name], MD  Brief HPI: 70 year old Caucasian female with a past medical history of nephrolithiasis, bilateral hydronephrosis, status post right nephrostomy, diastolic congestive heart failure, chronic kidney disease stage V, coronary artery disease who has had multiple hospitalizations in the past few months due to sepsis associated with her nephrostomy. She had her nephrostomy tube changed on January 13. This was followed by onset of fever, nausea and vomiting and flank pain. She presented back to the emergency department and was found to have sepsis. She was subsequently hospitalized. She started improving. Subsequently, developed diarrhea. Stool was positive for C. difficile.  Past medical history:  Past Medical History  Diagnosis Date  . GERD (gastroesophageal reflux disease)   . Glaucoma   . H/O renal calculi 2002 & 2006  . H/O hiatal hernia   . Headache(784.0)     migraine  . Pneumonia     dx 10-06-2014 per CXR--  on 10-27-2014 pt states finished antibiotic and denies cough or fever  . Hyperlipidemia   . History of MI (myocardial infarction)     10/ 2013 in setting of Septic Shock  . History of atrial fibrillation     10/ 2013  in setting of Septic Shock  . History of CHF (congestive heart failure)     10/ 2013 in setting of septic shock  . Sigmoid diverticulosis   . Nephrolithiasis     bilateral  . CKD (chronic kidney disease), stage III     nephrologist-  dr Florene Glen  . Right ureteral stone   . History of acute respiratory failure     10/ 2013  -- ventilated in setting septic shock  . Dysrhythmia     Afib in 2013 when she had septic shock related to stone ureteral obstruction  . Myocardial infarction [facility])     Was reported in 2013 during hospitalization with septic shock  . Septic shock ([place]) 04/04/2012  . Sepsis ([place])   . Hypertension     medication  removed from regimen due to low blood pressure   . Peripheral vascular disease ([place])   . S/P hemodialysis catheter insertion ([place]) 04/11/2015     right anterior chest , only used once   . History of nephrostomy 04/11/2015     currently inplace 04/28/2015   . History of blood transfusion 04/13/2015   . Low iron     hx  . Glaucoma   . Complication of anesthesia     use a little anesthesia , per patient MD states she quit breathing     Consultants: None So far  Procedures: None  Antibiotics: Vancomycin and Zosyn topped on 1/17 Oral vancomycin 1/17 Vantin 1/17  Subjective: Patient has had 2 episodes of diarrhea since last night. She denies any abdominal pain. Flank Pa pain is improved. Denies any nausea, vomiting. Overall, she feels better.  Objective: Vital Signs  Filed Vitals:   07/06/15 0526 07/06/15 1320 07/06/15 2046 07/07/15 0507  BP: 116/66 140/64 137/66 148/68  Pulse: 77 85 62 54  Temp: 97.4 F (36.3 C) 97.3 F (36.3 C) 97.6 F (36.4 C) 97.4 F (36.3 C)  TempSrc: Oral Oral Oral Oral  Resp: 18 20 20 18   Height:      Weight: 89.4 kg (197 lb 1.5 oz)   88.2 kg (194 lb 7.1 oz)  SpO2: 99% 100% 100% 100%    Intake/Output Summary (Last 24 hours) at 07/07/15  [place] filed at 07/07/15 0708  Gross per 24 hour  Intake 588.25 ml  Output   1500 ml  Net -911.75 ml   Filed Weights   07/05/15 1738 07/06/15 0526 07/07/15 0507  Weight: 87.8 kg (193 lb 9 oz) 89.4 kg (197 lb 1.5 oz) 88.2 kg (194 lb 7.1 oz)    General appearance: alert, cooperative, appears stated age and no distress Resp: Improved air entry at the bases. No crackles, wheezing or rhonchi. Right-sided dialysis catheter noted. She has a fistula in the left arm. Cardio: S1, S2 is normal. Regular. No S3, S4. No rubs, murmurs, or bruit. No pedal edema. GI: Abdomen remains soft. Nontender. There is a nephrostomy tube in the right. No erythema is noted. Bowel sounds are present. No masses or  organomegaly. Neurologic: Alert and oriented 3. No focal neurological deficits are noted.  Lab Results:  Basic Metabolic Panel:  Recent Labs Lab 07/03/15 2358 07/04/15 1014 07/05/15 0418 07/06/15 0435 07/07/15 0505  NA 133* 137 137 141 143  K 4.6 4.0 4.5 4.0 4.1  CL 104 114* 110 116* 115*  CO2 14* 13* 13* 17* 18*  GLUCOSE 163* 111* 113* 118* 111*  BUN 64* 57* 56* 63* 60*  CREATININE 5.12* 4.71* 4.69* 4.61* 4.45*  CALCIUM 8.9 7.8* 7.7* 8.2* 9.1   Liver Function Tests:  Recent Labs Lab 07/03/15 2358  AST 18  ALT 13*  ALKPHOS 88  BILITOT 0.6  PROT 7.1  ALBUMIN 3.4*   CBC:  Recent Labs Lab 07/03/15 2358 07/04/15 1014 07/05/15 0418 07/06/15 0435 07/07/15 0505  WBC 18.4* 16.9* 11.9* 10.5 8.7  NEUTROABS 17.4*  --   --   --   --   HGB 13.7 10.5* 10.8* 10.2* 12.3  HCT 43.2 34.0* 36.2 32.4* 40.3  MCV 86.6 88.8 91.6 88.8 88.6  PLT 252 208 183 216 262   BNP (last 3 results)  Recent Labs  02/26/15 0530 05/23/15 0602 07/04/15 1014  BNP 35.3 37.2 183.0*    CBG:  Recent Labs Lab 07/04/15 0924 07/05/15 0827 07/06/15 0738 07/07/15 0734  GLUCAP 122* 100* 130* 105*    Recent Results (from the past 240 hour(s))  Blood culture (routine x 2)     Status: None (Preliminary result)   Collection Time: 07/04/15  1:40 AM  Result Value Ref Range Status   Specimen Description BLOOD RIGHT ARM  Final   Special Requests BOTTLES DRAWN AEROBIC AND ANAEROBIC 5ML  Final   Culture   Final    NO GROWTH 2 DAYS Performed at [facility]    Report Status PENDING  Incomplete  Blood culture (routine x 2)     Status: None (Preliminary result)   Collection Time: 07/04/15  2:05 AM  Result Value Ref Range Status   Specimen Description BLOOD RIGHT HAND  Final   Special Requests BOTTLES DRAWN AEROBIC AND ANAEROBIC 5CC  Final   Culture   Final    NO GROWTH 2 DAYS Performed at [facility]    Report Status PENDING  Incomplete  Urine culture     Status: None    Collection Time: 07/04/15  3:03 AM  Result Value Ref Range Status   Specimen Description URINE, RANDOM  Final   Special Requests NONE  Final   Culture   Final    10,000 COLONIES/mL YEAST Performed at [facility]    Report Status 07/06/2015 FINAL  Final  MRSA PCR Screening     Status: Abnormal  Collection Time: 07/04/15  3:22 PM  Result Value Ref Range Status   MRSA by PCR POSITIVE (A) NEGATIVE Final    Comment:        The GeneXpert MRSA Assay (FDA approved for NASAL specimens only), is one component of a comprehensive MRSA colonization surveillance program. It is not intended to diagnose MRSA infection nor to guide or monitor treatment for MRSA infections. RESULT CALLED TO, READ BACK BY AND VERIFIED WITH: TUSHMAN,D RN AT 1803 ON 1.14.17 BY EPPERSON,S   C difficile quick scan w PCR reflex     Status: Abnormal   Collection Time: 07/07/15  4:43 AM  Result Value Ref Range Status   C Diff antigen POSITIVE (A) NEGATIVE Final   C Diff toxin NEGATIVE NEGATIVE Final   C Diff interpretation   Final    C. difficile present, but toxin not detected. This indicates colonization. In most cases, this does not require treatment. If patient has signs and symptoms consistent with colitis, consider treatment. Requires ENTERIC precautions.      Studies/Results: No results found.  Medications:  Scheduled: . acidophilus  2 capsule Oral Daily  . cefpodoxime  200 mg Oral Q12H  . famotidine  20 mg Oral Daily  . feeding supplement (NEPRO CARB STEADY)  237 mL Oral Daily  . heparin  5,000 Units Subcutaneous 3 times per day  . hydrocortisone sod succinate (SOLU-CORTEF) inj  50 mg Intravenous Q12H  . [START ON 07/08/2015] hydrocortisone sod succinate (SOLU-CORTEF) inj  50 mg Intravenous Daily  . latanoprost  1 drop Both Eyes QHS  . mupirocin ointment  1 application Nasal BID  . sodium bicarbonate  1,300 mg Oral TID  . sodium chloride  500 mL Intravenous Once  . sodium chloride  3 mL  Intravenous Q12H  . timolol  1 drop Both Eyes q morning - 10a  . vancomycin  125 mg Oral 4 times per day   Continuous: . sodium chloride 10 mL/hr (07/06/15 1003)   [id number], dextromethorphan, HYDROcodone-acetaminophen, morphine injection, ondansetron (ZOFRAN) IV, phenol, sodium chloride  Assessment/Plan:  Principal Problem:   Sepsis ([place]) Active Problems:   Nephrolithiasis   Essential hypertension, benign   Acute pyelonephritis   CKD (chronic kidney disease), stage V (HCC)   Bilateral hydronephrosis   CAD (coronary artery disease)   Chronic diastolic CHF (congestive heart failure) ([place])   Essential hypertension   Hydronephrosis with urinary obstruction due to renal calculus    Sepsis, likely from urinary source, unspecified organism Patient was septic with leukocytosis, fever, tachycardia and tachypnea. Lactic acid was normal. Blood pressure noted to be low and patient was given IV fluids. She was also given stress dose steroids. Cortisol level was checked and is found to be normal. Continue to taper steroids.  Blood and urine cultures are negative. Changed to oral Vantin.   C Diff Initiate oral vancomycin.   Hx of Nephrolithiasis, bilateral hydronephrosis He is s/p R nephrostomy, which was changed by IR 07/03/15. Pain management. Her urologist is Dr. Gaynelle Arabian. Patient is noted to have good output from her nephrostomy. Interventional radiology is aware of her admission.  CKD (chronic kidney disease), stage V , with non-anion gap metabolic acidosis Baseline creatinine 4.87-5.23. Renal function appears to be stable. Cut back on IV fluids. Bicarbonate is noted to be improved today. Continue oral sodium bicarbonate. She is followed by Dr. Florene Glen. She has an appointment with him in 1-2 weeks. She has a dialysis catheter in the right chest. She has a fistula  placed in the left arm. No erythema noted around the dialysis catheter site. Low threshold to involve nephrology if her  renal function gets worse.   Hypotension  Blood pressures have improved. Not noted to be on any blood pressure lowering agents at home. Blood pressure was low likely due to hypovolemia. Lactic acid level is reassuringly normal. Stress dose steroids were initiated and will be tapered.   CAD (coronary artery disease) Appears to be stable. Continue to monitor.   GERD Pepcid   Chronic diastolic CHF (congestive heart failure) 2-D echo on 10/23/14 showed EF 65-70 percent. Patient is not on diuretics at home. Patient does not have leg edema. CHF is compensated.   DVT Prophylaxis: Subcutaneous heparin    Code Status: DO NOT RESUSCITATE  Family Communication: Discussed with the patient and her daughter Disposition Plan: Patient continues to improve. Initiated oral vancomycin for C. difficile. Changed to oral antibiotics today. If she remains stable over the next 24 hour, and if her diarrhea improves, she could be discharged tomorrow.    LOS: 3 days   [place] [facility] Pager [phone] 07/07/2015, 9:44 AM  If 7PM-7AM, please contact night-coverage at [email or web address], password [facility]

## 2015-07-07 NOTE — Care Management Important Message (Signed)
Important Message  Patient Details IM Letter given to Cookie/Case Manager to present to Patient Name: [name] MRN: [id number] Date of Birth: Feb 25, 1946   Medicare Important Message Given:  Yes    Camillo Flaming 07/07/2015, 10:04 AMImportant Message  Patient Details  Name: [name] MRN: [id number] Date of Birth: April 24, 1946   Medicare Important Message Given:  Yes    Camillo Flaming 07/07/2015, 10:04 AM

## 2015-07-08 DIAGNOSIS — I10 Essential (primary) hypertension: Secondary | ICD-10-CM

## 2015-07-08 DIAGNOSIS — A047 Enterocolitis due to Clostridium difficile: Secondary | ICD-10-CM

## 2015-07-08 DIAGNOSIS — N10 Acute tubulo-interstitial nephritis: Secondary | ICD-10-CM

## 2015-07-08 DIAGNOSIS — N1330 Unspecified hydronephrosis: Secondary | ICD-10-CM

## 2015-07-08 LAB — BASIC METABOLIC PANEL
Anion gap: 12 (ref 5–15)
BUN: 58 mg/dL — ABNORMAL HIGH (ref 6–20)
CO2: 20 mmol/L — ABNORMAL LOW (ref 22–32)
Calcium: 8.8 mg/dL — ABNORMAL LOW (ref 8.9–10.3)
Chloride: 109 mmol/L (ref 101–111)
Creatinine, Ser: 4.32 mg/dL — ABNORMAL HIGH (ref 0.44–1.00)
GFR calc Af Amer: 11 mL/min — ABNORMAL LOW (ref >60)
GFR calc non Af Amer: 10 mL/min — ABNORMAL LOW (ref >60)
Glucose, Bld: 74 mg/dL (ref 65–99)
Potassium: 3.1 mmol/L — ABNORMAL LOW (ref 3.5–5.1)
Sodium: 141 mmol/L (ref 135–145)

## 2015-07-08 LAB — CBC
HCT: 37.7 % (ref 36.0–46.0)
Hemoglobin: 11.6 g/dL — ABNORMAL LOW (ref 12.0–15.0)
MCH: 27.1 pg (ref 26.0–34.0)
MCHC: 30.8 g/dL (ref 30.0–36.0)
MCV: 88.1 fL (ref 78.0–100.0)
Platelets: 236 10*3/uL (ref 150–400)
RBC: 4.28 MIL/uL (ref 3.87–5.11)
RDW: 16.6 % — ABNORMAL HIGH (ref 11.5–15.5)
WBC: 8.4 10*3/uL (ref 4.0–10.5)

## 2015-07-08 LAB — GLUCOSE, CAPILLARY: Glucose-Capillary: 74 mg/dL (ref 65–99)

## 2015-07-08 MED ORDER — VANCOMYCIN 50 MG/ML ORAL SOLUTION: 125.0000 mg | Freq: Four times a day (QID) | ORAL | Status: DC

## 2015-07-08 MED ORDER — SACCHAROMYCES BOULARDII 250 MG PO CAPS: 250.0000 mg | ORAL_CAPSULE | Freq: Two times a day (BID) | ORAL | Status: DC

## 2015-07-08 MED ORDER — SODIUM CHLORIDE 0.9 % IJ SOLN
INTRAMUSCULAR | Status: AC
  Filled 2015-07-08: qty 10

## 2015-07-08 MED ORDER — POTASSIUM CHLORIDE CRYS ER 20 MEQ PO TBCR
60.0000 meq | EXTENDED_RELEASE_TABLET | Freq: Once | ORAL | Status: AC
  Administered 2015-07-08: 60 meq via ORAL
  Filled 2015-07-08: qty 3

## 2015-07-08 MED ORDER — CEFUROXIME AXETIL 250 MG PO TABS: 250.0000 mg | ORAL_TABLET | Freq: Two times a day (BID) | ORAL | Status: DC

## 2015-07-08 MED ORDER — HYDROCODONE-ACETAMINOPHEN 5-325 MG PO TABS: 1.0000 | ORAL_TABLET | ORAL | Status: DC | PRN

## 2015-07-08 MED ORDER — SACCHAROMYCES BOULARDII 250 MG PO CAPS
250.0000 mg | ORAL_CAPSULE | Freq: Two times a day (BID) | ORAL | Status: DC
  Administered 2015-07-08: 250 mg via ORAL
  Filled 2015-07-08: qty 1

## 2015-07-08 NOTE — Discharge Summary (Signed)
Physician Discharge Summary  [name] [id number] DOB: 1945-08-14 DOA: 07/03/2015  PCP: [name], MD  Admit date: 07/03/2015 Discharge date: 07/08/2015  Time spent: 20 minutes  Recommendations for Outpatient Follow-up:  1. Follow up with PCP in 2-3 weeks   Discharge Diagnoses:  Principal Problem:   Sepsis ([place]) Active Problems:   Nephrolithiasis   Essential hypertension, benign   Acute pyelonephritis   CKD (chronic kidney disease), stage V (HCC)   Bilateral hydronephrosis   CAD (coronary artery disease)   Chronic diastolic CHF (congestive heart failure) ([place])   Essential hypertension   Hydronephrosis with urinary obstruction due to renal calculus   C. difficile diarrhea   Discharge Condition: Stable  Diet recommendation: Heart healthy  Filed Weights   07/06/15 0526 07/07/15 0507 07/08/15 0655  Weight: 89.4 kg (197 lb 1.5 oz) 88.2 kg (194 lb 7.1 oz) 90.7 kg (199 lb 15.3 oz)    History of present illness:  Please review dictated H and P from 1/13 for details. Briefly, 70 year old Caucasian female with a past medical history of nephrolithiasis, bilateral hydronephrosis, status post right nephrostomy, diastolic congestive heart failure, chronic kidney disease stage V, coronary artery disease who has had multiple hospitalizations in the past few months due to sepsis associated with her nephrostomy. She had her nephrostomy tube changed on January 13. This was followed by onset of fever, nausea and vomiting and flank pain. She presented back to the emergency department and was found to have sepsis. She was subsequently hospitalized. She started improving. Subsequently, developed diarrhea. Stool was positive for C. difficile.  Hospital Course:  Sepsis, likely from urinary source, unspecified organism Patient was septic with leukocytosis, fever, tachycardia and tachypnea. Lactic acid was normal. Blood pressure noted to be low and patient was given IV fluids. Patient  was also given stress dose steroids. Cortisol level was found to be normal.Blood and urine cultures were negative. Changed to oral Vantin and later Ceftin on discharge to complete 4 more days of abx  C Diff Initiate oral vancomycin as of 1/17.   Hx of Nephrolithiasis, bilateral hydronephrosis He is s/p R nephrostomy, which was changed by IR 07/03/15. Pain management. Her urologist is Dr. Gaynelle Arabian. Patient is noted to have good output from her nephrostomy. Interventional radiology is aware of her admission.  CKD (chronic kidney disease), stage V , with non-anion gap metabolic acidosis Baseline creatinine 4.87-5.23. Renal function appears to be stable. Cut back on IV fluids. Bicarbonate is noted to be improved today. Continue oral sodium bicarbonate. She is followed by Dr. Florene Glen. She has an appointment with him in 1-2 weeks. She has a dialysis catheter in the right chest. She has a fistula placed in the left arm. No erythema noted around the dialysis catheter site.  Hypotension  Blood pressures have improved. Not noted to be on any blood pressure lowering agents at home. Blood pressure was low likely due to hypovolemia. Lactic acid level is reassuringly normal. Stress dose steroids were initiated and were tapered to off  CAD (coronary artery disease) Remained stable.   GERD Pepcid   Chronic diastolic CHF (congestive heart failure) 2-D echo on 10/23/14 showed EF 65-70 percent. Patient is not on diuretics at home. Patient does not have leg edema. CHF is compensated.   Procedures:    Consultations:  IR  Discharge Exam: Filed Vitals:   07/07/15 2148 07/07/15 2236 07/08/15 0624 07/08/15 0655  BP: 139/87 128/83 152/65   Pulse: 78 56 63   Temp: 97.8 F (36.6  C) 97.6 F (36.4 C) 97.5 F (36.4 C)   TempSrc: Oral Oral Oral   Resp: 20 20 20    Height:      Weight:    90.7 kg (199 lb 15.3 oz)  SpO2: 97% 99% 99%     General: awake, in nad Cardiovascular: regular, s1, s2 Respiratory:  normal resp effort, no wheezing  Discharge Instructions     Medication List    TAKE these medications        acetaminophen 500 MG tablet  Commonly known as:  TYLENOL  Take 500 mg by mouth every 6 (six) hours as needed for moderate pain.     acidophilus Caps capsule  Take 2 capsules by mouth daily.     bimatoprost 0.01 % Soln  Commonly known as:  LUMIGAN  Place 1 drop into both eyes at bedtime.     cefUROXime 250 MG tablet  Commonly known as:  CEFTIN  Take 1 tablet (250 mg total) by mouth 2 (two) times daily with a meal.     dextromethorphan 30 MG/5ML liquid  Commonly known as:  DELSYM  Take 2.5 mLs (15 mg total) by mouth every 8 (eight) hours as needed for cough.     diphenoxylate-atropine 2.5-0.025 MG/5ML liquid  Commonly known as:  LOMOTIL  Take 10 mLs by mouth 4 (four) times daily as needed for diarrhea or loose stools.     feeding supplement (NEPRO CARB STEADY) Liqd  Take 237 mLs by mouth daily.     HYDROcodone-acetaminophen 5-325 MG tablet  Commonly known as:  NORCO/VICODIN  Take 1 tablet by mouth every 4 (four) hours as needed for moderate pain.     HYDROcodone-acetaminophen 5-325 MG tablet  Commonly known as:  NORCO/VICODIN  Take 1 tablet by mouth every 4 (four) hours as needed for moderate pain.     ondansetron 4 MG tablet  Commonly known as:  ZOFRAN  Take 1 tablet (4 mg total) by mouth every 6 (six) hours as needed for nausea.     ranitidine 150 MG tablet  Commonly known as:  ZANTAC  Take 150 mg by mouth 2 (two) times daily as needed for heartburn.     saccharomyces boulardii 250 MG capsule  Commonly known as:  FLORASTOR  Take 1 capsule (250 mg total) by mouth 2 (two) times daily.     saccharomyces boulardii 250 MG capsule  Commonly known as:  FLORASTOR  Take 1 capsule (250 mg total) by mouth 2 (two) times daily.     sodium bicarbonate 650 MG tablet  Take 1 tablet (650 mg total) by mouth 2 (two) times daily.     sodium chloride 0.65 % Soln nasal  spray  Commonly known as:  OCEAN  Place 1 spray into both nostrils 3 (three) times daily as needed for congestion.     timolol 0.5 % ophthalmic solution  Commonly known as:  BETIMOL  Place 1 drop into both eyes every morning.     vancomycin 50 mg/mL oral solution  Commonly known as:  VANCOCIN  Take 2.5 mLs (125 mg total) by mouth every 6 (six) hours.       Allergies  Allergen Reactions  . Vicodin [Hydrocodone-Acetaminophen] Nausea And Vomiting  . Chlorhexidine Rash    Sunburn    rash  . Percocet [Oxycodone-Acetaminophen] Nausea And Vomiting   Follow-up Information    Follow up with [name], MD. Go on 07/21/2015.   Specialty:  Internal Medicine   Why:  at 1:00pm  For Post Hospitalization Follow Up, Take your Discharge Instruction to your appt   Contact information:   [address] [place] [address] [phone]        The results of significant diagnostics from this hospitalization (including imaging, microbiology, ancillary and laboratory) are listed below for reference.    Significant Diagnostic Studies: Dg Chest 2 View  07/04/2015  CLINICAL DATA:  70 year old female with fever. New nephrostomy tube placement. EXAM: CHEST  2 VIEW COMPARISON:  Radiograph dated 05/23/2015 FINDINGS: The right-sided dialysis catheter with tip at the cavoatrial junction. There is no focal consolidation, pleural effusion, or pneumothorax. There are bilateral central and hilar vascular prominence. Retrocardiac air correspond to the hiatal hernia seen on the CT. The cardiac silhouette is within normal limits. The osseous structures appear unremarkable. IMPRESSION: No focal consolidation. Electronically Signed   By: Anner Crete M.D.   On: 07/04/2015 00:53   Ir Nephrostomy Exchange Right  07/03/2015  CLINICAL DATA:  CHRONIC RIGHT HYDRONEPHROSIS, CHRONIC INDWELLING EXTERNAL RIGHT NEPHROSTOMY, BILATERAL INTERNAL URETERAL STENTS EXAM: FLUOROSCOPIC RIGHT 10 FRENCH  NEPHROSTOMY EXCHANGE Date:  1/13/20171/13/2017 11:58 am Radiologist:  M. Daryll Brod, MD Guidance:  Fluoroscopic FLUOROSCOPY TIME:  1.4 minutes, 18.6 mGy MEDICATIONS AND MEDICAL HISTORY: 1% lidocaine local ANESTHESIA/SEDATION: None. CONTRAST:  32mL OMNIPAQUE IOHEXOL 300 MG/ML  SOLN COMPLICATIONS: None immediate PROCEDURE: Informed consent was obtained from the patient following explanation of the procedure, risks, benefits and alternatives. The patient understands, agrees and consents for the procedure. All questions were addressed. A time out was performed. Maximal barrier sterile technique utilized including caps, mask, sterile gowns, sterile gloves, large sterile drape, hand hygiene, and Betadine. Under sterile conditions and local anesthesia, the existing right nephrostomy catheter was injected with contrast confirming position in the renal pelvis. Stable ureteral stents. Nephrostomy catheter was cut and removed over a Bentson guidewire. New 10 French nephrostomy was advanced with the retention loop formed in the renal pelvis. Position confirmed with fluoroscopy. Images obtained for documentation. Catheter secured with a Prolene suture and connected to external gravity drainage. Sterile dressing applied. No immediate complication. IMPRESSION: Successful fluoroscopic exchange of the 10 French right nephrostomy Electronically Signed   By: Jerilynn Mages.  Shick M.D.   On: 07/03/2015 14:17    Microbiology: Recent Results (from the past 240 hour(s))  Blood culture (routine x 2)     Status: None (Preliminary result)   Collection Time: 07/04/15  1:40 AM  Result Value Ref Range Status   Specimen Description BLOOD RIGHT ARM  Final   Special Requests BOTTLES DRAWN AEROBIC AND ANAEROBIC 5ML  Final   Culture   Final    NO GROWTH 4 DAYS Performed at [facility]    Report Status PENDING  Incomplete  Blood culture (routine x 2)     Status: None (Preliminary result)   Collection Time: 07/04/15  2:05 AM  Result  Value Ref Range Status   Specimen Description BLOOD RIGHT HAND  Final   Special Requests BOTTLES DRAWN AEROBIC AND ANAEROBIC 5CC  Final   Culture   Final    NO GROWTH 4 DAYS Performed at [facility]    Report Status PENDING  Incomplete  Urine culture     Status: None   Collection Time: 07/04/15  3:03 AM  Result Value Ref Range Status   Specimen Description URINE, RANDOM  Final   Special Requests NONE  Final   Culture   Final    10,000 COLONIES/mL YEAST Performed at [facility]    Report Status 07/06/2015 FINAL  Final  MRSA PCR Screening     Status: Abnormal   Collection Time: 07/04/15  3:22 PM  Result Value Ref Range Status   MRSA by PCR POSITIVE (A) NEGATIVE Final    Comment:        The GeneXpert MRSA Assay (FDA approved for NASAL specimens only), is one component of a comprehensive MRSA colonization surveillance program. It is not intended to diagnose MRSA infection nor to guide or monitor treatment for MRSA infections. RESULT CALLED TO, READ BACK BY AND VERIFIED WITH: TUSHMAN,D RN AT 1803 ON 1.14.17 BY EPPERSON,S   C difficile quick scan w PCR reflex     Status: Abnormal   Collection Time: 07/07/15  4:43 AM  Result Value Ref Range Status   C Diff antigen POSITIVE (A) NEGATIVE Final   C Diff toxin NEGATIVE NEGATIVE Final   C Diff interpretation   Final    C. difficile present, but toxin not detected. This indicates colonization. In most cases, this does not require treatment. If patient has signs and symptoms consistent with colitis, consider treatment. Requires ENTERIC precautions.     Labs: Basic Metabolic Panel:  Recent Labs Lab 07/04/15 1014 07/05/15 0418 07/06/15 0435 07/07/15 0505 07/08/15 0700  NA 137 137 141 143 141  K 4.0 4.5 4.0 4.1 3.1*  CL 114* 110 116* 115* 109  CO2 13* 13* 17* 18* 20*  GLUCOSE 111* 113* 118* 111* 74  BUN 57* 56* 63* 60* 58*  CREATININE 4.71* 4.69* 4.61* 4.45* 4.32*  CALCIUM 7.8* 7.7* 8.2* 9.1 8.8*   Liver  Function Tests:  Recent Labs Lab 07/03/15 2358  AST 18  ALT 13*  ALKPHOS 88  BILITOT 0.6  PROT 7.1  ALBUMIN 3.4*   No results for input(s): LIPASE, AMYLASE in the last 168 hours. No results for input(s): AMMONIA in the last 168 hours. CBC:  Recent Labs Lab 07/03/15 2358 07/04/15 1014 07/05/15 0418 07/06/15 0435 07/07/15 0505 07/08/15 0700  WBC 18.4* 16.9* 11.9* 10.5 8.7 8.4  NEUTROABS 17.4*  --   --   --   --   --   HGB 13.7 10.5* 10.8* 10.2* 12.3 11.6*  HCT 43.2 34.0* 36.2 32.4* 40.3 37.7  MCV 86.6 88.8 91.6 88.8 88.6 88.1  PLT 252 208 183 216 262 236   Cardiac Enzymes: No results for input(s): CKTOTAL, CKMB, CKMBINDEX, TROPONINI in the last 168 hours. BNP: BNP (last 3 results)  Recent Labs  02/26/15 0530 05/23/15 0602 07/04/15 1014  BNP 35.3 37.2 183.0*    ProBNP (last 3 results) No results for input(s): PROBNP in the last 8760 hours.  CBG:  Recent Labs Lab 07/04/15 0924 07/05/15 0827 07/06/15 0738 07/07/15 0734 07/08/15 0754  GLUCAP 122* 100* 130* 105* 74       Signed:  Anara Cowman K  [facility] 07/08/2015, 6:26 PM

## 2015-07-08 NOTE — Progress Notes (Signed)
Taking over care for pt. Agree with previous RN's assessment. Will continue to monitor.

## 2015-07-08 NOTE — Progress Notes (Signed)
Pt discharged to home via private vehicle driven by daughter. Discharge instructions given and all questions answered.

## 2015-07-09 ENCOUNTER — Encounter (HOSPITAL_COMMUNITY)
Admission: RE | Admit: 2015-07-09 | Discharge: 2015-07-09 | Disposition: A | Discharge status: Home / Self Care | Payer: Medicare Other | Source: Ambulatory Visit | Attending: Nephrology | Admitting: Nephrology

## 2015-07-09 DIAGNOSIS — D631 Anemia in chronic kidney disease: Secondary | ICD-10-CM | POA: Diagnosis not present

## 2015-07-09 DIAGNOSIS — Z79899 Other long term (current) drug therapy: Secondary | ICD-10-CM | POA: Diagnosis not present

## 2015-07-09 DIAGNOSIS — N184 Chronic kidney disease, stage 4 (severe): Secondary | ICD-10-CM | POA: Diagnosis not present

## 2015-07-09 DIAGNOSIS — Z5181 Encounter for therapeutic drug level monitoring: Secondary | ICD-10-CM | POA: Diagnosis not present

## 2015-07-09 LAB — CULTURE, BLOOD (ROUTINE X 2)
Culture: NO GROWTH
Culture: NO GROWTH

## 2015-07-09 MED ORDER — ALTEPLASE 2 MG IJ SOLR
4.0000 mg | INTRAMUSCULAR | Status: AC
  Administered 2015-07-09: 4 mg
  Filled 2015-07-09 (×5): qty 4

## 2015-07-09 MED ORDER — DARBEPOETIN ALFA 60 MCG/0.3ML IJ SOSY
PREFILLED_SYRINGE | INTRAMUSCULAR | Status: AC
  Filled 2015-07-09: qty 0.3

## 2015-07-09 MED ORDER — DARBEPOETIN ALFA 60 MCG/0.3ML IJ SOSY
60.0000 ug | PREFILLED_SYRINGE | INTRAMUSCULAR | Status: DC
  Administered 2015-07-09: 60 ug via SUBCUTANEOUS

## 2015-07-10 ENCOUNTER — Encounter: Payer: Self-pay | Admitting: Vascular Surgery

## 2015-07-10 ENCOUNTER — Encounter (HOSPITAL_COMMUNITY)
Admission: RE | Admit: 2015-07-10 | Discharge: 2015-07-10 | Disposition: A | Discharge status: Home / Self Care | Payer: Medicare Other | Source: Ambulatory Visit | Attending: Nephrology | Admitting: Nephrology

## 2015-07-10 ENCOUNTER — Other Ambulatory Visit (HOSPITAL_COMMUNITY): Payer: Self-pay | Admitting: Urology

## 2015-07-10 DIAGNOSIS — N184 Chronic kidney disease, stage 4 (severe): Secondary | ICD-10-CM | POA: Diagnosis not present

## 2015-07-10 DIAGNOSIS — N1330 Unspecified hydronephrosis: Secondary | ICD-10-CM

## 2015-07-10 MED ORDER — HEPARIN SODIUM (PORCINE) 1000 UNIT/ML IJ SOLN
1000.0000 [IU] | Freq: Once | INTRAMUSCULAR | Status: AC
  Administered 2015-07-10: 1000 [IU] via INTRAVENOUS
  Filled 2015-07-10: qty 1

## 2015-07-16 ENCOUNTER — Encounter (HOSPITAL_COMMUNITY)
Admission: RE | Admit: 2015-07-16 | Discharge: 2015-07-16 | Disposition: A | Discharge status: Home / Self Care | Payer: Medicare Other | Source: Ambulatory Visit | Attending: Nephrology | Admitting: Nephrology

## 2015-07-16 DIAGNOSIS — N184 Chronic kidney disease, stage 4 (severe): Secondary | ICD-10-CM | POA: Diagnosis not present

## 2015-07-16 LAB — RENAL FUNCTION PANEL
Albumin: 2.3 g/dL — ABNORMAL LOW (ref 3.5–5.0)
Anion gap: 8 (ref 5–15)
BUN: 33 mg/dL — ABNORMAL HIGH (ref 6–20)
CO2: 25 mmol/L (ref 22–32)
Calcium: 8.4 mg/dL — ABNORMAL LOW (ref 8.9–10.3)
Chloride: 109 mmol/L (ref 101–111)
Creatinine, Ser: 3.96 mg/dL — ABNORMAL HIGH (ref 0.44–1.00)
GFR calc Af Amer: 12 mL/min — ABNORMAL LOW (ref >60)
GFR calc non Af Amer: 11 mL/min — ABNORMAL LOW (ref >60)
Glucose, Bld: 138 mg/dL — ABNORMAL HIGH (ref 65–99)
Phosphorus: 4.2 mg/dL (ref 2.5–4.6)
Potassium: 3.9 mmol/L (ref 3.5–5.1)
Sodium: 142 mmol/L (ref 135–145)

## 2015-07-17 ENCOUNTER — Encounter: Payer: Self-pay | Admitting: Vascular Surgery

## 2015-07-17 ENCOUNTER — Ambulatory Visit (INDEPENDENT_AMBULATORY_CARE_PROVIDER_SITE_OTHER): Payer: Self-pay | Admitting: Vascular Surgery

## 2015-07-17 ENCOUNTER — Other Ambulatory Visit: Payer: Self-pay

## 2015-07-17 VITALS — BP 154/91 | HR 73 | Ht 63.0 in | Wt 181.4 lb

## 2015-07-17 DIAGNOSIS — N184 Chronic kidney disease, stage 4 (severe): Secondary | ICD-10-CM

## 2015-07-17 DIAGNOSIS — N179 Acute kidney failure, unspecified: Secondary | ICD-10-CM

## 2015-07-17 NOTE — Progress Notes (Signed)
Postoperative Access Visit   History of Present Illness  [name] is a 70 y.o. year old female who presents for postoperative follow-up for: L BC AVF (Date: 06/02/15).  The patient's wounds are healed.  The patient notes no steal symptoms.  The patient is able to complete their activities of daily living.  The patient's current symptoms are: no bruit.  Past Medical History  Diagnosis Date  . GERD (gastroesophageal reflux disease)   . Glaucoma   . H/O renal calculi 2002 & 2006  . H/O hiatal hernia   . Headache(784.0)     migraine  . Pneumonia     dx 10-06-2014 per CXR--  on 10-27-2014 pt states finished antibiotic and denies cough or fever  . Hyperlipidemia   . History of MI (myocardial infarction)     10/ 2013 in setting of Septic Shock  . History of atrial fibrillation     10/ 2013  in setting of Septic Shock  . History of CHF (congestive heart failure)     10/ 2013 in setting of septic shock  . Sigmoid diverticulosis   . Nephrolithiasis     bilateral  . CKD (chronic kidney disease), stage III     nephrologist-  dr Florene Glen  . Right ureteral stone   . History of acute respiratory failure     10/ 2013  -- ventilated in setting septic shock  . Dysrhythmia     Afib in 2013 when she had septic shock related to stone ureteral obstruction  . Myocardial infarction [facility])     Was reported in 2013 during hospitalization with septic shock  . Septic shock ([place]) 04/04/2012  . Sepsis ([place])   . Hypertension     medication removed from regimen due to low blood pressure   . Peripheral vascular disease ([place])   . S/P hemodialysis catheter insertion ([place]) 04/11/2015     right anterior chest , only used once   . History of nephrostomy 04/11/2015     currently inplace 04/28/2015   . History of blood transfusion 04/13/2015   . Low iron     hx  . Glaucoma   . Complication of anesthesia     use a little anesthesia , per patient MD states she quit breathing     Past Surgical  History  Procedure Laterality Date  . Cystoscopy w/ ureteral stent placement  04/04/2012    Procedure: CYSTOSCOPY WITH RETROGRADE PYELOGRAM/URETERAL STENT PLACEMENT;  Surgeon: Ailene Rud, MD;  Location: [place];  Service: Urology;  Laterality: Left;  . Total abdominal hysterectomy w/ bilateral salpingoophorectomy  1993    secondary to fibroids  . Breast biopsy Left 08/23/07    benign fibrocystic with duct ectasia  . Cardiac catheterization  07-11-2012  dr Irish Lack    Abnormal stress test/   normal coronary arteries/  LVEDP  51mmHg  . Cardiovascular stress test  06-26-2012  dr Irish Lack    marked ischemia in the basal anterior, mid anterior, apical inferior regions/  normal LVF, ef 63%  . Transthoracic echocardiogram  04-09-2012    normal LVF,  ef 60-65%,  mild LAE,  mild TR, trivial MR and PR  . Cataract extraction w/ intraocular lens  implant, bilateral    . Knee arthroscopy Left 02-14-2003  . Laparoscopic cholecystectomy  03-23-2005  . Extracorporeal shock wave lithotripsy  05-28-2012  &  10-08-2012  . Cystoscopy with stent placement Right 10/28/2014    Procedure: RIGHT URETERAL STENT PLACEMENT;  Surgeon:  Irine Seal, MD;  Location: [facility];  Service: Urology;  Laterality: Right;  . Cystoscopy/retrograde/ureteroscopy/stone extraction with basket Right 11/21/2014    Procedure: CYSTOSCOPY/RIGHT RETROGRADE PYELOGRAM/RIGHT URETEROSCOPY/BASKET EXTRACTION/RIGHT PYELOSCOPY/LASER OF STONE/RIGHT DOUBLE J STENT;  Surgeon: Carolan Clines, MD;  Location: [place];  Service: Urology;  Laterality: Right;  . Holmium laser application Right [id number]    Procedure: HOLMIUM LASER APPLICATION;  Surgeon: Carolan Clines, MD;  Location: [place];  Service: Urology;  Laterality: Right;  . Cystoscopy with stent placement Right 02/26/2015    Procedure: CYSTOSCOPY RETROGRADE PYELOGRAM WITH STENT PLACEMENT;  Surgeon: Cleon Gustin, MD;  Location: [facility];  Service: Urology;  Laterality: Right;  . Cystoscopy w/ ureteral stent placement Bilateral 05/04/2015    Procedure: CYSTOSCOPY WITH BILATERAL RETROGRADE PYELOGRAM/ WITH INTERPRETATION, EXCHANGE OF RIGHT URETERAL STENT REPLACEMENT AND PLACEMENT LEFT URETERAL STENT PLACEMENT EXAMINATION OF VAGINA;  Surgeon: Carolan Clines, MD;  Location: [facility];  Service: Urology;  Laterality: Bilateral;  . Av fistula placement Left 06/02/2015    Procedure: BRACHIOCEPHALIC ARTERIOVENOUS (AV) FISTULA CREATION ;  Surgeon: Conrad Ocracoke, MD;  Location: [place];  Service: Vascular;  Laterality: Left;    Social History   Social History  . Marital Status: Widowed    Spouse Name: N/A  . Number of Children: 1  . Years of Education: N/A   Occupational History  . retired   . legal assistant    Social History Main Topics  . Smoking status: Never Smoker   . Smokeless tobacco: Never Used  . Alcohol Use: No  . Drug Use: No  . Sexual Activity: No     Comment: widow husband passed 5/05 with lung cancer   Other Topics Concern  . Not on file   Social History Narrative    Family History  Problem Relation Age of Onset  . Hypertension Mother   . Cancer Mother 17    breast  . Dementia Mother   . Hypertension Brother   . Diabetes Brother   . Heart disease Brother     before age 50  . Cancer Father 62    pancreatic  . Heart failure Paternal Grandmother   . Bladder Cancer Maternal Grandfather     Current Outpatient Prescriptions  Medication Sig Dispense Refill  . acetaminophen (TYLENOL) 500 MG tablet Take 500 mg by mouth every 6 (six) hours as needed for moderate pain.    Marland Kitchen acidophilus (RISAQUAD) CAPS capsule Take 2 capsules by mouth daily. 60 capsule 0  . bimatoprost (LUMIGAN) 0.01 % SOLN Place 1 drop into both eyes at bedtime.    Marland Kitchen dextromethorphan (DELSYM) 30 MG/5ML liquid Take 2.5 mLs (15 mg total) by mouth every 8 (eight) hours as needed for cough. 89 mL 0  . diphenoxylate-atropine (LOMOTIL)  2.5-0.025 MG/5ML liquid Take 10 mLs by mouth 4 (four) times daily as needed for diarrhea or loose stools. 60 mL 0  . Nutritional Supplements (FEEDING SUPPLEMENT, NEPRO CARB STEADY,) LIQD Take 237 mLs by mouth daily. 30 Can 0  . ondansetron (ZOFRAN) 4 MG tablet Take 1 tablet (4 mg total) by mouth every 6 (six) hours as needed for nausea. 20 tablet 0  . ranitidine (ZANTAC) 150 MG tablet Take 150 mg by mouth 2 (two) times daily as needed for heartburn.    . saccharomyces boulardii (FLORASTOR) 250 MG capsule Take 1 capsule (250 mg total) by mouth 2 (two) times daily. 60 capsule 0  . sodium bicarbonate 650 MG  tablet Take 1 tablet (650 mg total) by mouth 2 (two) times daily. 60 tablet 0  . sodium chloride (OCEAN) 0.65 % SOLN nasal spray Place 1 spray into both nostrils 3 (three) times daily as needed for congestion.    . timolol (BETIMOL) 0.5 % ophthalmic solution Place 1 drop into both eyes every morning.     . vancomycin (VANCOCIN) 50 mg/mL oral solution Take 2.5 mLs (125 mg total) by mouth every 6 (six) hours.    . cefUROXime (CEFTIN) 250 MG tablet Take 1 tablet (250 mg total) by mouth 2 (two) times daily with a meal. (Patient not taking: Reported on 07/17/2015) 8 tablet 0  . HYDROcodone-acetaminophen (NORCO/VICODIN) 5-325 MG tablet Take 1 tablet by mouth every 4 (four) hours as needed for moderate pain. (Patient not taking: Reported on 07/17/2015) 20 tablet 0  . Vancomycin HCl (FIRST-VANCOMYCIN 50) 50 MG/ML SOLN      No current facility-administered medications for this visit.     Allergies  Allergen Reactions  . Vicodin [Hydrocodone-Acetaminophen] Nausea And Vomiting  . Chlorhexidine Rash    Sunburn    rash  . Percocet [Oxycodone-Acetaminophen] Nausea And Vomiting     REVIEW OF SYSTEMS:  (Positives checked otherwise negative)  CARDIOVASCULAR:   [ ]  chest pain,  [ ]  chest pressure,  [ ]  palpitations,  [ ]  shortness of breath when laying flat,  [ ]  shortness of breath with exertion,   [ ]   pain in feet when walking,  [ ]  pain in feet when laying flat, [ ]  history of blood clot in veins (DVT),  [ ]  history of phlebitis,  [ ]  swelling in legs,  [ ]  varicose veins  PULMONARY:   [ ]  productive cough,  [ ]  asthma,  [ ]  wheezing  NEUROLOGIC:   [ ]  weakness in arms or legs,  [ ]  numbness in arms or legs,  [ ]  difficulty speaking or slurred speech,  [ ]  temporary loss of vision in one eye,  [ ]  dizziness  HEMATOLOGIC:   [ ]  bleeding problems,  [ ]  problems with blood clotting too easily  MUSCULOSKEL:   [ ]  joint pain, [ ]  joint swelling  GASTROINTEST:   [ ]  vomiting blood,  [ ]  blood in stool     GENITOURINARY:   [ ]  burning with urination,  [ ]  blood in urine  PSYCHIATRIC:   [ ]  history of major depression  INTEGUMENTARY:   [ ]  rashes,  [ ]  ulcers  CONSTITUTIONAL:   [ ]  fever,  [ ]  chills    Physical Examination Filed Vitals:   07/17/15 0909 07/17/15 0911  BP: 166/91 154/91  Pulse: 73    Pulmonary: Sym exp, good air movt, CTAB, no rales, rhonchi, & wheezing  Cardiac: RRR, Nl S1, S2, no Murmurs, rubs or gallops  LUE: Incision is healed, skin feels warm, hand grip is 5/5, sensation in digits is intact, nopalpable thrill, bruit can not be auscultated, firm proximal BC AVF c/w thrombosis  Medical Decision Making  [name] is a 70 y.o. year old female who presents s/p L BC AVF.   Unfortunately, the [facility] AVF has been occluded now for >2 weeks, so no salvage is possible.  Pt's next option in L staged BVT. Risk, benefits, and alternatives to access surgery were discussed.  The patient is aware the risks include but are not limited to: bleeding, infection, steal syndrome, nerve damage, ischemic monomelic neuropathy, failure to mature, need for additional  procedures, death and stroke.   The patient is aware that this is a STAGED procedure requiring TWO operations. The patient agrees to proceed forward with the procedure.  The patient's  tunneled dialysis catheter can be removed after two successful cannulations and completed dialysis treatments.  Thank you for allowing [name] to participate in this patient's care.  Adele Barthel, MD [facility] Office: [phone] Pager: [phone]  07/17/2015, 9:55 AM

## 2015-07-21 ENCOUNTER — Other Ambulatory Visit (HOSPITAL_COMMUNITY): Payer: Self-pay | Admitting: Urology

## 2015-07-21 DIAGNOSIS — N184 Chronic kidney disease, stage 4 (severe): Secondary | ICD-10-CM

## 2015-07-21 DIAGNOSIS — N1330 Unspecified hydronephrosis: Secondary | ICD-10-CM

## 2015-07-22 ENCOUNTER — Other Ambulatory Visit: Payer: Self-pay | Admitting: Interventional Cardiology

## 2015-07-23 ENCOUNTER — Encounter (HOSPITAL_COMMUNITY)
Admission: RE | Admit: 2015-07-23 | Discharge: 2015-07-23 | Disposition: A | Discharge status: Home / Self Care | Payer: Medicare Other | Source: Ambulatory Visit | Attending: Nephrology | Admitting: Nephrology

## 2015-07-23 DIAGNOSIS — D631 Anemia in chronic kidney disease: Secondary | ICD-10-CM | POA: Insufficient documentation

## 2015-07-23 DIAGNOSIS — N184 Chronic kidney disease, stage 4 (severe): Secondary | ICD-10-CM | POA: Insufficient documentation

## 2015-07-23 DIAGNOSIS — Z5181 Encounter for therapeutic drug level monitoring: Secondary | ICD-10-CM | POA: Insufficient documentation

## 2015-07-23 DIAGNOSIS — Z79899 Other long term (current) drug therapy: Secondary | ICD-10-CM | POA: Insufficient documentation

## 2015-07-23 NOTE — Telephone Encounter (Signed)
Please advise on refill request as patient is prn follow up with Dr Irish Lack and it looks like dose was changed to 12.5mg  bid by Dr Tat. Thanks, MI

## 2015-07-23 NOTE — Telephone Encounter (Signed)
Please refer to Dr Tat. Thanks.

## 2015-07-24 ENCOUNTER — Encounter (HOSPITAL_COMMUNITY): Payer: Self-pay | Admitting: *Deleted

## 2015-07-24 NOTE — Telephone Encounter (Signed)
Yes, please refer to the pts PCP.

## 2015-07-24 NOTE — Telephone Encounter (Signed)
Dr Tat is an internal medicine [facility]. Should it be deferred to pcp as patient is prn follow up with Dr Irish Lack?

## 2015-07-24 NOTE — Progress Notes (Signed)
Ms Desmond Lope tested postive for C-Diff Antigen on 07/07/15.  Patient was  discharged on Vancomycin- she has completed all doses.  I spoke with Margret in Infection Control, patient will need to remain on Enteric precautions and no retest for 30 days.

## 2015-07-26 MED ORDER — CEFUROXIME SODIUM 1.5 G IJ SOLR
1.5000 g | INTRAMUSCULAR | Status: AC
  Administered 2015-07-27: 1.5 g via INTRAVENOUS
  Filled 2015-07-26: qty 1.5

## 2015-07-26 MED ORDER — SODIUM CHLORIDE 0.9 % IV SOLN
INTRAVENOUS | Status: DC
  Administered 2015-07-27 (×2): via INTRAVENOUS

## 2015-07-27 ENCOUNTER — Encounter (HOSPITAL_COMMUNITY)
Admission: RE | Disposition: A | Discharge status: Home / Self Care | Payer: Self-pay | Source: Ambulatory Visit | Attending: Vascular Surgery

## 2015-07-27 ENCOUNTER — Ambulatory Visit (HOSPITAL_COMMUNITY): Payer: Medicare Other | Admitting: Certified Registered"

## 2015-07-27 ENCOUNTER — Encounter (HOSPITAL_COMMUNITY): Payer: Self-pay | Admitting: *Deleted

## 2015-07-27 ENCOUNTER — Ambulatory Visit (HOSPITAL_COMMUNITY)
Admission: RE | Admit: 2015-07-27 | Discharge: 2015-07-27 | Disposition: A | Discharge status: Home / Self Care | Payer: Medicare Other | Source: Ambulatory Visit | Attending: Vascular Surgery | Admitting: Vascular Surgery

## 2015-07-27 DIAGNOSIS — N183 Chronic kidney disease, stage 3 (moderate): Secondary | ICD-10-CM | POA: Diagnosis not present

## 2015-07-27 DIAGNOSIS — E785 Hyperlipidemia, unspecified: Secondary | ICD-10-CM | POA: Insufficient documentation

## 2015-07-27 DIAGNOSIS — I509 Heart failure, unspecified: Secondary | ICD-10-CM | POA: Diagnosis not present

## 2015-07-27 DIAGNOSIS — I739 Peripheral vascular disease, unspecified: Secondary | ICD-10-CM | POA: Diagnosis not present

## 2015-07-27 DIAGNOSIS — Z79899 Other long term (current) drug therapy: Secondary | ICD-10-CM | POA: Insufficient documentation

## 2015-07-27 DIAGNOSIS — K219 Gastro-esophageal reflux disease without esophagitis: Secondary | ICD-10-CM | POA: Diagnosis not present

## 2015-07-27 DIAGNOSIS — I132 Hypertensive heart and chronic kidney disease with heart failure and with stage 5 chronic kidney disease, or end stage renal disease: Secondary | ICD-10-CM | POA: Insufficient documentation

## 2015-07-27 DIAGNOSIS — N185 Chronic kidney disease, stage 5: Secondary | ICD-10-CM | POA: Diagnosis not present

## 2015-07-27 DIAGNOSIS — Z792 Long term (current) use of antibiotics: Secondary | ICD-10-CM | POA: Diagnosis not present

## 2015-07-27 DIAGNOSIS — I252 Old myocardial infarction: Secondary | ICD-10-CM | POA: Insufficient documentation

## 2015-07-27 DIAGNOSIS — N186 End stage renal disease: Secondary | ICD-10-CM | POA: Insufficient documentation

## 2015-07-27 HISTORY — PX: BASCILIC VEIN TRANSPOSITION: SHX5742

## 2015-07-27 LAB — POCT I-STAT 4, (NA,K, GLUC, HGB,HCT)
Glucose, Bld: 85 mg/dL (ref 65–99)
HCT: 42 % (ref 36.0–46.0)
Hemoglobin: 14.3 g/dL (ref 12.0–15.0)
Potassium: 5.3 mmol/L — ABNORMAL HIGH (ref 3.5–5.1)
Sodium: 138 mmol/L (ref 135–145)

## 2015-07-27 SURGERY — TRANSPOSITION, VEIN, BASILIC
Anesthesia: General | Site: Arm Upper | Laterality: Left

## 2015-07-27 MED ORDER — LIDOCAINE HCL (PF) 1 % IJ SOLN
INTRAMUSCULAR | Status: AC
  Filled 2015-07-27: qty 30

## 2015-07-27 MED ORDER — ONDANSETRON HCL 4 MG/2ML IJ SOLN
INTRAMUSCULAR | Status: AC
  Filled 2015-07-27: qty 2

## 2015-07-27 MED ORDER — FENTANYL CITRATE (PF) 100 MCG/2ML IJ SOLN
INTRAMUSCULAR | Status: DC | PRN
  Administered 2015-07-27 (×3): 50 ug via INTRAVENOUS

## 2015-07-27 MED ORDER — PHENYLEPHRINE HCL 10 MG/ML IJ SOLN
INTRAMUSCULAR | Status: DC | PRN
  Administered 2015-07-27 (×2): 80 ug via INTRAVENOUS
  Administered 2015-07-27: 40 ug via INTRAVENOUS

## 2015-07-27 MED ORDER — HYDROCODONE-ACETAMINOPHEN 5-325 MG PO TABS: 1.0000 | ORAL_TABLET | Freq: Four times a day (QID) | ORAL | Status: DC | PRN

## 2015-07-27 MED ORDER — ONDANSETRON HCL 4 MG/2ML IJ SOLN: 4.0000 mg | Freq: Once | INTRAMUSCULAR | Status: DC | PRN

## 2015-07-27 MED ORDER — SODIUM CHLORIDE 0.9 % IV SOLN
INTRAVENOUS | Status: DC | PRN
  Administered 2015-07-27: 500 mL

## 2015-07-27 MED ORDER — HYDROMORPHONE HCL 1 MG/ML IJ SOLN: 0.5000 mg | INTRAMUSCULAR | Status: DC | PRN

## 2015-07-27 MED ORDER — LIDOCAINE HCL (CARDIAC) 20 MG/ML IV SOLN
INTRAVENOUS | Status: AC
  Filled 2015-07-27: qty 5

## 2015-07-27 MED ORDER — HEPARIN SODIUM (PORCINE) 1000 UNIT/ML IJ SOLN
INTRAMUSCULAR | Status: AC
  Filled 2015-07-27: qty 1

## 2015-07-27 MED ORDER — LIDOCAINE HCL (PF) 1 % IJ SOLN
INTRAMUSCULAR | Status: DC | PRN
  Administered 2015-07-27: 3 mL

## 2015-07-27 MED ORDER — STERILE WATER FOR IRRIGATION IR SOLN
Status: DC | PRN
  Administered 2015-07-27: 1000 mL

## 2015-07-27 MED ORDER — LIDOCAINE HCL (CARDIAC) 20 MG/ML IV SOLN
INTRAVENOUS | Status: DC | PRN
  Administered 2015-07-27: 40 mg via INTRATRACHEAL

## 2015-07-27 MED ORDER — 0.9 % SODIUM CHLORIDE (POUR BTL) OPTIME
TOPICAL | Status: DC | PRN
  Administered 2015-07-27: 1000 mL

## 2015-07-27 MED ORDER — FENTANYL CITRATE (PF) 250 MCG/5ML IJ SOLN
INTRAMUSCULAR | Status: AC
  Filled 2015-07-27: qty 5

## 2015-07-27 MED ORDER — PROPOFOL 500 MG/50ML IV EMUL
INTRAVENOUS | Status: DC | PRN
  Administered 2015-07-27: 50 ug/kg/min via INTRAVENOUS

## 2015-07-27 MED ORDER — ONDANSETRON HCL 4 MG/2ML IJ SOLN
INTRAMUSCULAR | Status: DC | PRN
  Administered 2015-07-27: 4 mg via INTRAVENOUS

## 2015-07-27 SURGICAL SUPPLY — 41 items
CANISTER SUCTION 2500CC (MISCELLANEOUS) ×2 IMPLANT
CLIP TI MEDIUM 6 (CLIP) ×1 IMPLANT
CLIP TI MEDIUM LARGE 6 (CLIP) ×1 IMPLANT
CLIP TI WIDE RED SMALL 6 (CLIP) ×1 IMPLANT
CORDS BIPOLAR (ELECTRODE) IMPLANT
COVER PROBE W GEL 5X96 (DRAPES) ×1 IMPLANT
DECANTER SPIKE VIAL GLASS SM (MISCELLANEOUS) ×2 IMPLANT
DRSG COVADERM 4X10 (GAUZE/BANDAGES/DRESSINGS) IMPLANT
DRSG COVADERM 4X8 (GAUZE/BANDAGES/DRESSINGS) IMPLANT
ELECT REM PT RETURN 9FT ADLT (ELECTROSURGICAL) ×2
ELECTRODE REM PT RTRN 9FT ADLT (ELECTROSURGICAL) ×1 IMPLANT
GLOVE BIO SURGEON STRL SZ7 (GLOVE) ×2 IMPLANT
GLOVE BIOGEL PI IND STRL 6.5 (GLOVE) IMPLANT
GLOVE BIOGEL PI IND STRL 7.0 (GLOVE) IMPLANT
GLOVE BIOGEL PI IND STRL 7.5 (GLOVE) ×1 IMPLANT
GLOVE BIOGEL PI INDICATOR 6.5 (GLOVE) ×3
GLOVE BIOGEL PI INDICATOR 7.0 (GLOVE) ×1
GLOVE BIOGEL PI INDICATOR 7.5 (GLOVE) ×2
GLOVE ECLIPSE 6.5 STRL STRAW (GLOVE) ×1 IMPLANT
GLOVE ECLIPSE 7.0 STRL STRAW (GLOVE) ×1 IMPLANT
GOWN SPEC L4 XLG W/TWL (GOWN DISPOSABLE) ×1 IMPLANT
GOWN STRL REUS W/ TWL LRG LVL3 (GOWN DISPOSABLE) ×3 IMPLANT
GOWN STRL REUS W/TWL LRG LVL3 (GOWN DISPOSABLE) ×6
KIT BASIN OR (CUSTOM PROCEDURE TRAY) ×2 IMPLANT
KIT ROOM TURNOVER OR (KITS) ×2 IMPLANT
LIQUID BAND (GAUZE/BANDAGES/DRESSINGS) ×2 IMPLANT
NS IRRIG 1000ML POUR BTL (IV SOLUTION) ×2 IMPLANT
PACK CV ACCESS (CUSTOM PROCEDURE TRAY) ×2 IMPLANT
PAD ARMBOARD 7.5X6 YLW CONV (MISCELLANEOUS) ×4 IMPLANT
SPONGE SURGIFOAM ABS GEL 100 (HEMOSTASIS) IMPLANT
STAPLER VISISTAT 35W (STAPLE) IMPLANT
SUT MNCRL AB 4-0 PS2 18 (SUTURE) ×2 IMPLANT
SUT PROLENE 6 0 BV (SUTURE) ×2 IMPLANT
SUT PROLENE 7 0 BV 1 (SUTURE) ×1 IMPLANT
SUT SILK 2 0 SH (SUTURE) ×2 IMPLANT
SUT VIC AB 2-0 CT1 27 (SUTURE) ×2
SUT VIC AB 2-0 CT1 TAPERPNT 27 (SUTURE) ×1 IMPLANT
SUT VIC AB 3-0 SH 27 (SUTURE) ×4
SUT VIC AB 3-0 SH 27X BRD (SUTURE) ×2 IMPLANT
UNDERPAD 30X30 INCONTINENT (UNDERPADS AND DIAPERS) ×2 IMPLANT
WATER STERILE IRR 1000ML POUR (IV SOLUTION) ×2 IMPLANT

## 2015-07-27 NOTE — Transfer of Care (Signed)
Immediate Anesthesia Transfer of Care Note  Patient: [name]  Procedure(s) Performed: Procedure(s): FIRST STAGE BASILIC VEIN TRANSPOSITION LEFT UPPER ARM (Left)  Patient Location: PACU  Anesthesia Type:General  Level of Consciousness: awake, alert  and oriented  Airway & Oxygen Therapy: Patient Spontanous Breathing  Post-op Assessment: Report given to RN and Post -op Vital signs reviewed and stable  Post vital signs: Reviewed and stable  Last Vitals:  Filed Vitals:   07/27/15 0821  BP: 106/73  Pulse: 65  Temp: 36.2 C  Resp: 20    Complications: No apparent anesthesia complications

## 2015-07-27 NOTE — Anesthesia Procedure Notes (Addendum)
Procedure Name: MAC Date/Time: 07/27/2015 10:23 AM Performed by: Barrington Ellison Pre-anesthesia Checklist: Patient identified, Emergency Drugs available, Suction available, Patient being monitored and Timeout performed Patient Re-evaluated:Patient Re-evaluated prior to inductionOxygen Delivery Method: Simple face mask   Procedure Name: LMA Insertion Date/Time: 07/27/2015 11:10 AM Performed by: Sampson Si E Pre-anesthesia Checklist: Patient identified, Timeout performed, Emergency Drugs available, Suction available and Patient being monitored Patient Re-evaluated:Patient Re-evaluated prior to inductionOxygen Delivery Method: Circle system utilized Preoxygenation: Pre-oxygenation with 100% oxygen LMA: LMA inserted LMA Size: 3.0 Tube type: Oral Number of attempts: 1 Placement Confirmation: breath sounds checked- equal and bilateral and positive ETCO2 Tube secured with: Tape Dental Injury: Teeth and Oropharynx as per pre-operative assessment  Comments: LMA inserted by AM CRNA- atraumatic--- teeth and mouth as preop

## 2015-07-27 NOTE — Anesthesia Postprocedure Evaluation (Signed)
Anesthesia Post Note  Patient: [name]  Procedure(s) Performed: Procedure(s) (LRB): FIRST STAGE BASILIC VEIN TRANSPOSITION LEFT UPPER ARM (Left)  Patient location during evaluation: PACU Anesthesia Type: General Level of consciousness: awake, oriented and patient cooperative Pain management: pain level controlled Vital Signs Assessment: post-procedure vital signs reviewed and stable Respiratory status: spontaneous breathing and respiratory function stable Cardiovascular status: blood pressure returned to baseline and stable Anesthetic complications: no    Last Vitals:  Filed Vitals:   07/27/15 0821 07/27/15 1145  BP: 106/73   Pulse: 65   Temp: 36.2 C 37.1 C  Resp: 20     Last Pain:  Filed Vitals:   07/27/15 1151  PainSc: 4     LLE Motor Response: Purposeful movement (07/27/15 1145)   RLE Motor Response: Purposeful movement (07/27/15 1145)        Dyan Creelman EDWARD

## 2015-07-27 NOTE — Op Note (Signed)
OPERATIVE NOTE   PROCEDURE: 1. left first stage basilic vein transposition (brachiobasilic arteriovenous fistula) placement  PRE-OPERATIVE DIAGNOSIS: end stage renal disease   POST-OPERATIVE DIAGNOSIS: same as above   SURGEON: Adele Barthel, MD  ASSISTANT(S): Gerri Lins, PAC   ANESTHESIA: general  ESTIMATED BLOOD LOSS: 50 cc  FINDING(S): 1. Occluded fistula with neointimal hyperplasia 2. Patent brachial artery 3. Cubital vein: 3-4 mm in diameter 4. Weak thrill at end of case 5. Dopplerable left radial signal  SPECIMEN(S):  none  INDICATIONS:   [name] is a 70 y.o. female who presents with end stage renal disease.  The patient underwent a left brachiocephalic arteriovenous fistula which occluded.  Her vein mapping suggested her next access option was a stage basilic vein transposition.  The patient is scheduled for left first stage basilic vein transposition.  The patient is aware the risks include but are not limited to: bleeding, infection, steal syndrome, nerve damage, ischemic monomelic neuropathy, failure to mature, and need for additional procedures.  The patient is aware of the risks of the procedure and elects to proceed forward.  DESCRIPTION: After full informed written consent was obtained from the patient, the patient was brought back to the operating room and placed supine upon the operating table.  Prior to induction, the patient received IV antibiotics.   After obtaining adequate anesthesia, the patient was then prepped and draped in the standard fashion for a left arm access procedure.  I turned my attention first to identifying the patient's basilic vein and brachial artery.  Using SonoSite guidance, the location of these vessels were marked out on the skin.   I made a transverse incision at the level of the antecubitum and dissected through the subcutaneous tissue and fascia to gain exposure of the brachial artery.  This artery was anastomosed to the  cephalic vein.  This artery was dissected out proximally and distally and controlled with vessel loops .  In this process, I found a large cubital vein draining into the basilic vein.  This was noted to be 3-4 mm in diameter externally.  The distal segment of the vein was ligated with a  2-0 silk, and the vein was transected.  The proximal segment was interrogated with serial dilators.  The vein accepted up to a 4 mm dilator without any difficulty.  I then instilled the heparinized saline into the vein and clamped it.  At this point, I reset my exposure of the brachial artery and placed the artery under tension proximally and distally.   I transected the cephalic vein off the brachial artery.  I resected some residual vein on the artery.  I injected heparinized saline proximal and distal to this arteriotomy.  The vein was then sewn to the artery in an end-to-side configuration with a running stitch of 7-0 Prolene.  Prior to completing this anastomosis, I allowed the vein and artery to backbleed.  There was no evidence of clot from any vessels.  I completed the anastomosis in the usual fashion and then released all vessel loops and clamps.  There was a weakly palpable thrill in the venous outflow, and there was a dopplerable radial signal.  At this point, I irrigated out the surgical wound.  There was no further active bleeding.  The subcutaneous tissue was reapproximated with a running stitch of 3-0 Vicryl.  The skin was then reapproximated with a running subcuticular stitch of 4-0 Vicryl.  The skin was then cleaned, dried, and reinforced with Dermabond.  The patient tolerated this procedure well.    COMPLICATIONS: none  CONDITION: stable   Adele Barthel, MD [facility] Office: [phone] Pager: [phone]  07/27/2015, 11:26 AM

## 2015-07-27 NOTE — H&P (View-Only) (Signed)
Postoperative Access Visit   History of Present Illness  [name] is a 70 y.o. year old female who presents for postoperative follow-up for: L BC AVF (Date: 06/02/15).  The patient's wounds are healed.  The patient notes no steal symptoms.  The patient is able to complete their activities of daily living.  The patient's current symptoms are: no bruit.  Past Medical History  Diagnosis Date  . GERD (gastroesophageal reflux disease)   . Glaucoma   . H/O renal calculi 2002 & 2006  . H/O hiatal hernia   . Headache(784.0)     migraine  . Pneumonia     dx 10-06-2014 per CXR--  on 10-27-2014 pt states finished antibiotic and denies cough or fever  . Hyperlipidemia   . History of MI (myocardial infarction)     10/ 2013 in setting of Septic Shock  . History of atrial fibrillation     10/ 2013  in setting of Septic Shock  . History of CHF (congestive heart failure)     10/ 2013 in setting of septic shock  . Sigmoid diverticulosis   . Nephrolithiasis     bilateral  . CKD (chronic kidney disease), stage III     nephrologist-  dr Florene Glen  . Right ureteral stone   . History of acute respiratory failure     10/ 2013  -- ventilated in setting septic shock  . Dysrhythmia     Afib in 2013 when she had septic shock related to stone ureteral obstruction  . Myocardial infarction [facility])     Was reported in 2013 during hospitalization with septic shock  . Septic shock ([place]) 04/04/2012  . Sepsis ([place])   . Hypertension     medication removed from regimen due to low blood pressure   . Peripheral vascular disease ([place])   . S/P hemodialysis catheter insertion ([place]) 04/11/2015     right anterior chest , only used once   . History of nephrostomy 04/11/2015     currently inplace 04/28/2015   . History of blood transfusion 04/13/2015   . Low iron     hx  . Glaucoma   . Complication of anesthesia     use a little anesthesia , per patient MD states she quit breathing     Past Surgical  History  Procedure Laterality Date  . Cystoscopy w/ ureteral stent placement  04/04/2012    Procedure: CYSTOSCOPY WITH RETROGRADE PYELOGRAM/URETERAL STENT PLACEMENT;  Surgeon: Ailene Rud, MD;  Location: [place];  Service: Urology;  Laterality: Left;  . Total abdominal hysterectomy w/ bilateral salpingoophorectomy  1993    secondary to fibroids  . Breast biopsy Left 08/23/07    benign fibrocystic with duct ectasia  . Cardiac catheterization  07-11-2012  dr Irish Lack    Abnormal stress test/   normal coronary arteries/  LVEDP  41mmHg  . Cardiovascular stress test  06-26-2012  dr Irish Lack    marked ischemia in the basal anterior, mid anterior, apical inferior regions/  normal LVF, ef 63%  . Transthoracic echocardiogram  04-09-2012    normal LVF,  ef 60-65%,  mild LAE,  mild TR, trivial MR and PR  . Cataract extraction w/ intraocular lens  implant, bilateral    . Knee arthroscopy Left 02-14-2003  . Laparoscopic cholecystectomy  03-23-2005  . Extracorporeal shock wave lithotripsy  05-28-2012  &  10-08-2012  . Cystoscopy with stent placement Right 10/28/2014    Procedure: RIGHT URETERAL STENT PLACEMENT;  Surgeon:  Irine Seal, MD;  Location: [facility];  Service: Urology;  Laterality: Right;  . Cystoscopy/retrograde/ureteroscopy/stone extraction with basket Right 11/21/2014    Procedure: CYSTOSCOPY/RIGHT RETROGRADE PYELOGRAM/RIGHT URETEROSCOPY/BASKET EXTRACTION/RIGHT PYELOSCOPY/LASER OF STONE/RIGHT DOUBLE J STENT;  Surgeon: Carolan Clines, MD;  Location: [place];  Service: Urology;  Laterality: Right;  . Holmium laser application Right [id number]    Procedure: HOLMIUM LASER APPLICATION;  Surgeon: Carolan Clines, MD;  Location: [facility];  Service: Urology;  Laterality: Right;  . Cystoscopy with stent placement Right 02/26/2015    Procedure: CYSTOSCOPY RETROGRADE PYELOGRAM WITH STENT PLACEMENT;  Surgeon: Cleon Gustin, MD;  Location: [facility];  Service: Urology;  Laterality: Right;  . Cystoscopy w/ ureteral stent placement Bilateral 05/04/2015    Procedure: CYSTOSCOPY WITH BILATERAL RETROGRADE PYELOGRAM/ WITH INTERPRETATION, EXCHANGE OF RIGHT URETERAL STENT REPLACEMENT AND PLACEMENT LEFT URETERAL STENT PLACEMENT EXAMINATION OF VAGINA;  Surgeon: Carolan Clines, MD;  Location: [facility];  Service: Urology;  Laterality: Bilateral;  . Av fistula placement Left 06/02/2015    Procedure: BRACHIOCEPHALIC ARTERIOVENOUS (AV) FISTULA CREATION ;  Surgeon: Conrad Pisek, MD;  Location: [place];  Service: Vascular;  Laterality: Left;    Social History   Social History  . Marital Status: Widowed    Spouse Name: N/A  . Number of Children: 1  . Years of Education: N/A   Occupational History  . retired   . legal assistant    Social History Main Topics  . Smoking status: Never Smoker   . Smokeless tobacco: Never Used  . Alcohol Use: No  . Drug Use: No  . Sexual Activity: No     Comment: widow husband passed 5/05 with lung cancer   Other Topics Concern  . Not on file   Social History Narrative    Family History  Problem Relation Age of Onset  . Hypertension Mother   . Cancer Mother 58    breast  . Dementia Mother   . Hypertension Brother   . Diabetes Brother   . Heart disease Brother     before age 7  . Cancer Father 1    pancreatic  . Heart failure Paternal Grandmother   . Bladder Cancer Maternal Grandfather     Current Outpatient Prescriptions  Medication Sig Dispense Refill  . acetaminophen (TYLENOL) 500 MG tablet Take 500 mg by mouth every 6 (six) hours as needed for moderate pain.    Marland Kitchen acidophilus (RISAQUAD) CAPS capsule Take 2 capsules by mouth daily. 60 capsule 0  . bimatoprost (LUMIGAN) 0.01 % SOLN Place 1 drop into both eyes at bedtime.    Marland Kitchen dextromethorphan (DELSYM) 30 MG/5ML liquid Take 2.5 mLs (15 mg total) by mouth every 8 (eight) hours as needed for cough. 89 mL 0  . diphenoxylate-atropine (LOMOTIL)  2.5-0.025 MG/5ML liquid Take 10 mLs by mouth 4 (four) times daily as needed for diarrhea or loose stools. 60 mL 0  . Nutritional Supplements (FEEDING SUPPLEMENT, NEPRO CARB STEADY,) LIQD Take 237 mLs by mouth daily. 30 Can 0  . ondansetron (ZOFRAN) 4 MG tablet Take 1 tablet (4 mg total) by mouth every 6 (six) hours as needed for nausea. 20 tablet 0  . ranitidine (ZANTAC) 150 MG tablet Take 150 mg by mouth 2 (two) times daily as needed for heartburn.    . saccharomyces boulardii (FLORASTOR) 250 MG capsule Take 1 capsule (250 mg total) by mouth 2 (two) times daily. 60 capsule 0  . sodium bicarbonate 650 MG  tablet Take 1 tablet (650 mg total) by mouth 2 (two) times daily. 60 tablet 0  . sodium chloride (OCEAN) 0.65 % SOLN nasal spray Place 1 spray into both nostrils 3 (three) times daily as needed for congestion.    . timolol (BETIMOL) 0.5 % ophthalmic solution Place 1 drop into both eyes every morning.     . vancomycin (VANCOCIN) 50 mg/mL oral solution Take 2.5 mLs (125 mg total) by mouth every 6 (six) hours.    . cefUROXime (CEFTIN) 250 MG tablet Take 1 tablet (250 mg total) by mouth 2 (two) times daily with a meal. (Patient not taking: Reported on 07/17/2015) 8 tablet 0  . HYDROcodone-acetaminophen (NORCO/VICODIN) 5-325 MG tablet Take 1 tablet by mouth every 4 (four) hours as needed for moderate pain. (Patient not taking: Reported on 07/17/2015) 20 tablet 0  . Vancomycin HCl (FIRST-VANCOMYCIN 50) 50 MG/ML SOLN      No current facility-administered medications for this visit.     Allergies  Allergen Reactions  . Vicodin [Hydrocodone-Acetaminophen] Nausea And Vomiting  . Chlorhexidine Rash    Sunburn    rash  . Percocet [Oxycodone-Acetaminophen] Nausea And Vomiting     REVIEW OF SYSTEMS:  (Positives checked otherwise negative)  CARDIOVASCULAR:   [ ]  chest pain,  [ ]  chest pressure,  [ ]  palpitations,  [ ]  shortness of breath when laying flat,  [ ]  shortness of breath with exertion,   [ ]   pain in feet when walking,  [ ]  pain in feet when laying flat, [ ]  history of blood clot in veins (DVT),  [ ]  history of phlebitis,  [ ]  swelling in legs,  [ ]  varicose veins  PULMONARY:   [ ]  productive cough,  [ ]  asthma,  [ ]  wheezing  NEUROLOGIC:   [ ]  weakness in arms or legs,  [ ]  numbness in arms or legs,  [ ]  difficulty speaking or slurred speech,  [ ]  temporary loss of vision in one eye,  [ ]  dizziness  HEMATOLOGIC:   [ ]  bleeding problems,  [ ]  problems with blood clotting too easily  MUSCULOSKEL:   [ ]  joint pain, [ ]  joint swelling  GASTROINTEST:   [ ]  vomiting blood,  [ ]  blood in stool     GENITOURINARY:   [ ]  burning with urination,  [ ]  blood in urine  PSYCHIATRIC:   [ ]  history of major depression  INTEGUMENTARY:   [ ]  rashes,  [ ]  ulcers  CONSTITUTIONAL:   [ ]  fever,  [ ]  chills    Physical Examination Filed Vitals:   07/17/15 0909 07/17/15 0911  BP: 166/91 154/91  Pulse: 73    Pulmonary: Sym exp, good air movt, CTAB, no rales, rhonchi, & wheezing  Cardiac: RRR, Nl S1, S2, no Murmurs, rubs or gallops  LUE: Incision is healed, skin feels warm, hand grip is 5/5, sensation in digits is intact, nopalpable thrill, bruit can not be auscultated, firm proximal BC AVF c/w thrombosis  Medical Decision Making  [name] is a 70 y.o. year old female who presents s/p L BC AVF.   Unfortunately, the [facility] AVF has been occluded now for >2 weeks, so no salvage is possible.  Pt's next option in L staged BVT. Risk, benefits, and alternatives to access surgery were discussed.  The patient is aware the risks include but are not limited to: bleeding, infection, steal syndrome, nerve damage, ischemic monomelic neuropathy, failure to mature, need for additional  procedures, death and stroke.   The patient is aware that this is a STAGED procedure requiring TWO operations. The patient agrees to proceed forward with the procedure.  The patient's  tunneled dialysis catheter can be removed after two successful cannulations and completed dialysis treatments.  Thank you for allowing [name] to participate in this patient's care.  Adele Barthel, MD [facility] [place] Office: [phone] Pager: [phone]  07/17/2015, 9:55 AM

## 2015-07-27 NOTE — Anesthesia Preprocedure Evaluation (Addendum)
Anesthesia Evaluation  Patient identified by MRN, date of birth, ID band Patient awake    Reviewed: Allergy & Precautions, NPO status , Patient's Chart, lab work & pertinent test results  Airway Mallampati: I  TM Distance: >3 FB Neck ROM: Full    Dental  (+) Teeth Intact, Dental Advisory Given   Pulmonary     + decreased breath sounds      Cardiovascular hypertension, + CAD, + Past MI, + Peripheral Vascular Disease and +CHF  + dysrhythmias Atrial Fibrillation  Rhythm:Irregular Rate:Abnormal     Neuro/Psych  Headaches,    GI/Hepatic hiatal hernia, GERD  ,  Endo/Other    Renal/GU ARF and Renal InsufficiencyRenal disease     Musculoskeletal   Abdominal   Peds  Hematology   Anesthesia Other Findings   Reproductive/Obstetrics                            Anesthesia Physical Anesthesia Plan  ASA: III  Anesthesia Plan: MAC   Post-op Pain Management:    Induction: Intravenous  Airway Management Planned: Mask  Additional Equipment:   Intra-op Plan:   Post-operative Plan:   Informed Consent: I have reviewed the patients History and Physical, chart, labs and discussed the procedure including the risks, benefits and alternatives for the proposed anesthesia with the patient or authorized representative who has indicated his/her understanding and acceptance.     Plan Discussed with: CRNA, Anesthesiologist and Surgeon  Anesthesia Plan Comments:         Anesthesia Quick Evaluation

## 2015-07-27 NOTE — Interval H&P Note (Signed)
History and Physical Interval Note:  07/27/2015 10:04 AM  [name]  has presented today for surgery, with the diagnosis of Stage III Chronic Kidney Disease N18.3  The various methods of treatment have been discussed with the patient and family. After consideration of risks, benefits and other options for treatment, the patient has consented to  Procedure(s): FIRST STAGE BASILIC VEIN TRANSPOSITION (Left) as a surgical intervention .  The patient's history has been reviewed, patient examined, no change in status, stable for surgery.  I have reviewed the patient's chart and labs.  Questions were answered to the patient's satisfaction.     Adele Barthel

## 2015-07-28 ENCOUNTER — Encounter (HOSPITAL_COMMUNITY): Payer: Self-pay | Admitting: Vascular Surgery

## 2015-07-29 ENCOUNTER — Ambulatory Visit (HOSPITAL_COMMUNITY)
Admission: RE | Admit: 2015-07-29 | Discharge: 2015-07-29 | Disposition: A | Discharge status: Home / Self Care | Payer: Medicare Other | Source: Ambulatory Visit | Attending: Urology | Admitting: Urology

## 2015-07-29 DIAGNOSIS — N184 Chronic kidney disease, stage 4 (severe): Secondary | ICD-10-CM | POA: Insufficient documentation

## 2015-07-29 DIAGNOSIS — N1330 Unspecified hydronephrosis: Secondary | ICD-10-CM

## 2015-07-29 MED ORDER — TECHNETIUM TC 99M MERTIATIDE
15.0000 | Freq: Once | INTRAVENOUS | Status: AC | PRN
  Administered 2015-07-29: 14.6 via INTRAVENOUS

## 2015-07-29 MED ORDER — FUROSEMIDE 10 MG/ML IJ SOLN
INTRAMUSCULAR | Status: AC
  Filled 2015-07-29: qty 4

## 2015-07-29 MED ORDER — FUROSEMIDE 10 MG/ML IJ SOLN
40.0000 mg | Freq: Once | INTRAMUSCULAR | Status: AC
  Administered 2015-07-29: 40 mg via INTRAVENOUS

## 2015-07-30 ENCOUNTER — Telehealth: Payer: Self-pay | Admitting: Vascular Surgery

## 2015-07-30 ENCOUNTER — Encounter (HOSPITAL_COMMUNITY)
Admission: RE | Admit: 2015-07-30 | Discharge: 2015-07-30 | Disposition: A | Discharge status: Home / Self Care | Payer: Medicare Other | Source: Ambulatory Visit | Attending: Nephrology | Admitting: Nephrology

## 2015-07-30 DIAGNOSIS — Z79899 Other long term (current) drug therapy: Secondary | ICD-10-CM | POA: Diagnosis not present

## 2015-07-30 DIAGNOSIS — N184 Chronic kidney disease, stage 4 (severe): Secondary | ICD-10-CM | POA: Diagnosis not present

## 2015-07-30 DIAGNOSIS — D631 Anemia in chronic kidney disease: Secondary | ICD-10-CM | POA: Diagnosis not present

## 2015-07-30 DIAGNOSIS — Z5181 Encounter for therapeutic drug level monitoring: Secondary | ICD-10-CM | POA: Diagnosis not present

## 2015-07-30 NOTE — Telephone Encounter (Signed)
LM for pt re appt, dpm °

## 2015-07-30 NOTE — Telephone Encounter (Signed)
-----   Message from Mena Goes, RN sent at 07/27/2015 12:52 PM EST ----- Regarding: schedule   ----- Message -----    From: Conrad Weddington, MD    Sent: 07/27/2015  11:31 AM      To: [facility] [address]  [name] CU:[id number] 1945/11/08   PROCEDURE: left brachiocephalic arteriovenous fistula placement  Asst: Gerri Lins, [place] [facility]   Follow-up: 6 weeks

## 2015-07-31 ENCOUNTER — Encounter (HOSPITAL_COMMUNITY): Payer: Medicare Other

## 2015-07-31 DIAGNOSIS — N1330 Unspecified hydronephrosis: Secondary | ICD-10-CM | POA: Diagnosis not present

## 2015-07-31 DIAGNOSIS — N184 Chronic kidney disease, stage 4 (severe): Secondary | ICD-10-CM | POA: Diagnosis not present

## 2015-08-05 ENCOUNTER — Other Ambulatory Visit (HOSPITAL_COMMUNITY): Payer: Self-pay

## 2015-08-06 ENCOUNTER — Encounter (HOSPITAL_COMMUNITY)
Admission: RE | Admit: 2015-08-06 | Discharge: 2015-08-06 | Disposition: A | Discharge status: Home / Self Care | Payer: Medicare Other | Source: Ambulatory Visit | Attending: Nephrology | Admitting: Nephrology

## 2015-08-06 DIAGNOSIS — N184 Chronic kidney disease, stage 4 (severe): Secondary | ICD-10-CM | POA: Diagnosis not present

## 2015-08-06 LAB — POCT HEMOGLOBIN-HEMACUE: Hemoglobin: 11.5 g/dL — ABNORMAL LOW (ref 12.0–15.0)

## 2015-08-06 LAB — RENAL FUNCTION PANEL
Albumin: 3 g/dL — ABNORMAL LOW (ref 3.5–5.0)
Anion gap: 12 (ref 5–15)
BUN: 79 mg/dL — ABNORMAL HIGH (ref 6–20)
CO2: 20 mmol/L — ABNORMAL LOW (ref 22–32)
Calcium: 9.4 mg/dL (ref 8.9–10.3)
Chloride: 107 mmol/L (ref 101–111)
Creatinine, Ser: 5.2 mg/dL — ABNORMAL HIGH (ref 0.44–1.00)
GFR calc Af Amer: 9 mL/min — ABNORMAL LOW (ref >60)
GFR calc non Af Amer: 8 mL/min — ABNORMAL LOW (ref >60)
Glucose, Bld: 109 mg/dL — ABNORMAL HIGH (ref 65–99)
Phosphorus: 4.9 mg/dL — ABNORMAL HIGH (ref 2.5–4.6)
Potassium: 4.6 mmol/L (ref 3.5–5.1)
Sodium: 139 mmol/L (ref 135–145)

## 2015-08-06 LAB — FERRITIN: Ferritin: 198 ng/mL (ref 11–307)

## 2015-08-06 LAB — IRON AND TIBC
Iron: 71 ug/dL (ref 28–170)
Saturation Ratios: 27 % (ref 10.4–31.8)
TIBC: 259 ug/dL (ref 250–450)
UIBC: 188 ug/dL

## 2015-08-06 MED ORDER — DARBEPOETIN ALFA 100 MCG/0.5ML IJ SOSY: 100.0000 ug | PREFILLED_SYRINGE | INTRAMUSCULAR | Status: DC

## 2015-08-06 MED ORDER — DARBEPOETIN ALFA 100 MCG/0.5ML IJ SOSY
PREFILLED_SYRINGE | INTRAMUSCULAR | Status: AC
  Administered 2015-08-06: 100 ug via SUBCUTANEOUS
  Filled 2015-08-06: qty 0.5

## 2015-08-13 ENCOUNTER — Encounter (HOSPITAL_COMMUNITY): Payer: Self-pay | Admitting: *Deleted

## 2015-08-13 ENCOUNTER — Inpatient Hospital Stay (HOSPITAL_COMMUNITY)
Admission: EM | Admit: 2015-08-13 | Discharge: 2015-08-19 | DRG: 871 | Disposition: A | Discharge status: Home / Self Care | Payer: Medicare Other | Attending: Internal Medicine | Admitting: Internal Medicine

## 2015-08-13 ENCOUNTER — Other Ambulatory Visit: Payer: Self-pay

## 2015-08-13 ENCOUNTER — Ambulatory Visit (HOSPITAL_COMMUNITY)
Admission: RE | Admit: 2015-08-13 | Discharge: 2015-08-13 | Disposition: A | Discharge status: Home / Self Care | Payer: Medicare Other | Source: Ambulatory Visit | Attending: Urology | Admitting: Urology

## 2015-08-13 ENCOUNTER — Other Ambulatory Visit (HOSPITAL_COMMUNITY): Payer: Self-pay | Admitting: Urology

## 2015-08-13 ENCOUNTER — Emergency Department (HOSPITAL_COMMUNITY): Payer: Medicare Other

## 2015-08-13 ENCOUNTER — Other Ambulatory Visit (HOSPITAL_COMMUNITY): Payer: Self-pay

## 2015-08-13 DIAGNOSIS — D631 Anemia in chronic kidney disease: Secondary | ICD-10-CM | POA: Diagnosis not present

## 2015-08-13 DIAGNOSIS — I120 Hypertensive chronic kidney disease with stage 5 chronic kidney disease or end stage renal disease: Secondary | ICD-10-CM | POA: Diagnosis not present

## 2015-08-13 DIAGNOSIS — N179 Acute kidney failure, unspecified: Secondary | ICD-10-CM | POA: Diagnosis present

## 2015-08-13 DIAGNOSIS — Z833 Family history of diabetes mellitus: Secondary | ICD-10-CM | POA: Diagnosis not present

## 2015-08-13 DIAGNOSIS — N1330 Unspecified hydronephrosis: Secondary | ICD-10-CM | POA: Diagnosis not present

## 2015-08-13 DIAGNOSIS — Z888 Allergy status to other drugs, medicaments and biological substances status: Secondary | ICD-10-CM | POA: Diagnosis not present

## 2015-08-13 DIAGNOSIS — A4152 Sepsis due to Pseudomonas: Secondary | ICD-10-CM | POA: Diagnosis not present

## 2015-08-13 DIAGNOSIS — H409 Unspecified glaucoma: Secondary | ICD-10-CM | POA: Diagnosis present

## 2015-08-13 DIAGNOSIS — Z6831 Body mass index (BMI) 31.0-31.9, adult: Secondary | ICD-10-CM | POA: Diagnosis not present

## 2015-08-13 DIAGNOSIS — Z466 Encounter for fitting and adjustment of urinary device: Secondary | ICD-10-CM

## 2015-08-13 DIAGNOSIS — D649 Anemia, unspecified: Secondary | ICD-10-CM | POA: Diagnosis present

## 2015-08-13 DIAGNOSIS — I252 Old myocardial infarction: Secondary | ICD-10-CM

## 2015-08-13 DIAGNOSIS — E871 Hypo-osmolality and hyponatremia: Secondary | ICD-10-CM | POA: Diagnosis not present

## 2015-08-13 DIAGNOSIS — N3010 Interstitial cystitis (chronic) without hematuria: Secondary | ICD-10-CM | POA: Diagnosis not present

## 2015-08-13 DIAGNOSIS — E43 Unspecified severe protein-calorie malnutrition: Secondary | ICD-10-CM | POA: Diagnosis not present

## 2015-08-13 DIAGNOSIS — A419 Sepsis, unspecified organism: Secondary | ICD-10-CM | POA: Diagnosis not present

## 2015-08-13 DIAGNOSIS — N186 End stage renal disease: Secondary | ICD-10-CM | POA: Diagnosis not present

## 2015-08-13 DIAGNOSIS — I480 Paroxysmal atrial fibrillation: Secondary | ICD-10-CM | POA: Diagnosis not present

## 2015-08-13 DIAGNOSIS — J9811 Atelectasis: Secondary | ICD-10-CM | POA: Diagnosis not present

## 2015-08-13 DIAGNOSIS — Z885 Allergy status to narcotic agent status: Secondary | ICD-10-CM

## 2015-08-13 DIAGNOSIS — Z5181 Encounter for therapeutic drug level monitoring: Secondary | ICD-10-CM | POA: Diagnosis not present

## 2015-08-13 DIAGNOSIS — N183 Chronic kidney disease, stage 3 (moderate): Secondary | ICD-10-CM | POA: Diagnosis not present

## 2015-08-13 DIAGNOSIS — Z8052 Family history of malignant neoplasm of bladder: Secondary | ICD-10-CM

## 2015-08-13 DIAGNOSIS — N189 Chronic kidney disease, unspecified: Secondary | ICD-10-CM | POA: Diagnosis present

## 2015-08-13 DIAGNOSIS — T8383XA Hemorrhage of genitourinary prosthetic devices, implants and grafts, initial encounter: Secondary | ICD-10-CM

## 2015-08-13 DIAGNOSIS — Z87442 Personal history of urinary calculi: Secondary | ICD-10-CM

## 2015-08-13 DIAGNOSIS — Z95828 Presence of other vascular implants and grafts: Secondary | ICD-10-CM | POA: Diagnosis not present

## 2015-08-13 DIAGNOSIS — R310 Gross hematuria: Secondary | ICD-10-CM | POA: Diagnosis present

## 2015-08-13 DIAGNOSIS — N139 Obstructive and reflux uropathy, unspecified: Secondary | ICD-10-CM | POA: Diagnosis not present

## 2015-08-13 DIAGNOSIS — K219 Gastro-esophageal reflux disease without esophagitis: Secondary | ICD-10-CM | POA: Diagnosis present

## 2015-08-13 DIAGNOSIS — N390 Urinary tract infection, site not specified: Secondary | ICD-10-CM | POA: Diagnosis not present

## 2015-08-13 DIAGNOSIS — B965 Pseudomonas (aeruginosa) (mallei) (pseudomallei) as the cause of diseases classified elsewhere: Secondary | ICD-10-CM | POA: Diagnosis present

## 2015-08-13 DIAGNOSIS — Z79899 Other long term (current) drug therapy: Secondary | ICD-10-CM | POA: Diagnosis not present

## 2015-08-13 DIAGNOSIS — I770 Arteriovenous fistula, acquired: Secondary | ICD-10-CM | POA: Diagnosis not present

## 2015-08-13 DIAGNOSIS — I129 Hypertensive chronic kidney disease with stage 1 through stage 4 chronic kidney disease, or unspecified chronic kidney disease: Secondary | ICD-10-CM | POA: Diagnosis not present

## 2015-08-13 DIAGNOSIS — N185 Chronic kidney disease, stage 5: Secondary | ICD-10-CM | POA: Diagnosis not present

## 2015-08-13 DIAGNOSIS — I4891 Unspecified atrial fibrillation: Secondary | ICD-10-CM | POA: Diagnosis not present

## 2015-08-13 DIAGNOSIS — Z8249 Family history of ischemic heart disease and other diseases of the circulatory system: Secondary | ICD-10-CM | POA: Diagnosis not present

## 2015-08-13 DIAGNOSIS — I10 Essential (primary) hypertension: Secondary | ICD-10-CM | POA: Diagnosis not present

## 2015-08-13 DIAGNOSIS — E785 Hyperlipidemia, unspecified: Secondary | ICD-10-CM | POA: Diagnosis present

## 2015-08-13 DIAGNOSIS — N184 Chronic kidney disease, stage 4 (severe): Secondary | ICD-10-CM | POA: Diagnosis not present

## 2015-08-13 DIAGNOSIS — I739 Peripheral vascular disease, unspecified: Secondary | ICD-10-CM | POA: Diagnosis present

## 2015-08-13 DIAGNOSIS — Z436 Encounter for attention to other artificial openings of urinary tract: Secondary | ICD-10-CM | POA: Diagnosis not present

## 2015-08-13 DIAGNOSIS — R509 Fever, unspecified: Secondary | ICD-10-CM | POA: Diagnosis not present

## 2015-08-13 DIAGNOSIS — Z803 Family history of malignant neoplasm of breast: Secondary | ICD-10-CM | POA: Diagnosis not present

## 2015-08-13 DIAGNOSIS — R4182 Altered mental status, unspecified: Secondary | ICD-10-CM | POA: Diagnosis present

## 2015-08-13 LAB — COMPREHENSIVE METABOLIC PANEL
ALT: 10 U/L — ABNORMAL LOW (ref 14–54)
AST: 14 U/L — ABNORMAL LOW (ref 15–41)
Albumin: 3.4 g/dL — ABNORMAL LOW (ref 3.5–5.0)
Alkaline Phosphatase: 96 U/L (ref 38–126)
Anion gap: 12 (ref 5–15)
BUN: 74 mg/dL — ABNORMAL HIGH (ref 6–20)
CO2: 16 mmol/L — ABNORMAL LOW (ref 22–32)
Calcium: 9.6 mg/dL (ref 8.9–10.3)
Chloride: 107 mmol/L (ref 101–111)
Creatinine, Ser: 5.36 mg/dL — ABNORMAL HIGH (ref 0.44–1.00)
GFR calc Af Amer: 8 mL/min — ABNORMAL LOW (ref >60)
GFR calc non Af Amer: 7 mL/min — ABNORMAL LOW (ref >60)
Glucose, Bld: 141 mg/dL — ABNORMAL HIGH (ref 65–99)
Potassium: 4.2 mmol/L (ref 3.5–5.1)
Sodium: 135 mmol/L (ref 135–145)
Total Bilirubin: 0.3 mg/dL (ref 0.3–1.2)
Total Protein: 7.2 g/dL (ref 6.5–8.1)

## 2015-08-13 LAB — CBC
HCT: 29.9 % — ABNORMAL LOW (ref 36.0–46.0)
Hemoglobin: 9.3 g/dL — ABNORMAL LOW (ref 12.0–15.0)
MCH: 27.2 pg (ref 26.0–34.0)
MCHC: 31.1 g/dL (ref 30.0–36.0)
MCV: 87.4 fL (ref 78.0–100.0)
Platelets: 399 10*3/uL (ref 150–400)
RBC: 3.42 MIL/uL — ABNORMAL LOW (ref 3.87–5.11)
RDW: 16.5 % — ABNORMAL HIGH (ref 11.5–15.5)
WBC: 19.1 10*3/uL — ABNORMAL HIGH (ref 4.0–10.5)

## 2015-08-13 LAB — CBG MONITORING, ED: Glucose-Capillary: 129 mg/dL — ABNORMAL HIGH (ref 65–99)

## 2015-08-13 LAB — I-STAT CG4 LACTIC ACID, ED: Lactic Acid, Venous: 1.31 mmol/L (ref 0.5–2.0)

## 2015-08-13 MED ORDER — PIPERACILLIN-TAZOBACTAM 3.375 G IVPB 30 MIN
3.3750 g | Freq: Once | INTRAVENOUS | Status: AC
  Administered 2015-08-13: 3.375 g via INTRAVENOUS
  Filled 2015-08-13: qty 50

## 2015-08-13 MED ORDER — VANCOMYCIN HCL IN DEXTROSE 1-5 GM/200ML-% IV SOLN
1000.0000 mg | INTRAVENOUS | Status: DC
  Administered 2015-08-14: 1000 mg via INTRAVENOUS
  Filled 2015-08-13: qty 200

## 2015-08-13 MED ORDER — SODIUM CHLORIDE 0.9 % IV SOLN
Freq: Once | INTRAVENOUS | Status: AC
  Administered 2015-08-14: via INTRAVENOUS

## 2015-08-13 MED ORDER — IOHEXOL 300 MG/ML  SOLN
50.0000 mL | Freq: Once | INTRAMUSCULAR | Status: AC | PRN
  Administered 2015-08-13: 5 mL via INTRAVENOUS

## 2015-08-13 MED ORDER — ONDANSETRON HCL 4 MG/2ML IJ SOLN: 4.0000 mg | Freq: Once | INTRAMUSCULAR | Status: DC | PRN

## 2015-08-13 NOTE — ED Provider Notes (Signed)
CSN: [id number]     Arrival date & time 08/13/15  2159 History   First MD Initiated Contact with Patient 08/13/15 2234     Chief Complaint  Patient presents with  . Altered Mental Status  . Nausea     The history is provided by the patient. No language interpreter was used.   MARIAISABELLA MEENAN is a 70 y.o. female who presents to the Emergency Department complaining of AMS, concern for infection.  She has a hx/o nephrostomy tube placement for kidney disease.  Today she had a routine nephrostomy change and was feeling well at that time.  This evening she developed fever to 101, nausea, chills, and feels confused.  These are the symptoms that typically come with her sepsis.  She reports initially clear urine output at nephrostomy site but now it is bloody.  The bloody drainage is not too unusual for her. Sxs are severe, constant, worsening.    Past Medical History  Diagnosis Date  . GERD (gastroesophageal reflux disease)   . Glaucoma   . H/O renal calculi 2002 & 2006  . H/O hiatal hernia   . Headache(784.0)     migraine  . Pneumonia     dx 10-06-2014 per CXR--  on 10-27-2014 pt states finished antibiotic and denies cough or fever  . Hyperlipidemia   . History of MI (myocardial infarction)     10/ 2013 in setting of Septic Shock  . History of atrial fibrillation     10/ 2013  in setting of Septic Shock  . History of CHF (congestive heart failure)     10/ 2013 in setting of septic shock  . Sigmoid diverticulosis   . Nephrolithiasis     bilateral  . CKD (chronic kidney disease), stage III     nephrologist-  dr Florene Glen  . Right ureteral stone   . History of acute respiratory failure     10/ 2013  -- ventilated in setting septic shock  . Dysrhythmia     Afib in 2013 when she had septic shock related to stone ureteral obstruction  . Myocardial infarction [facility])     Was reported in 2013 during hospitalization with septic shock  . Septic shock ([place]) 04/04/2012  . Sepsis ([place])   .  Hypertension     medication removed from regimen due to low blood pressure   . Peripheral vascular disease ([place])   . S/P hemodialysis catheter insertion ([place]) 04/11/2015     right anterior chest , only used once   . History of nephrostomy 04/11/2015     currently inplace 04/28/2015   . History of blood transfusion 04/13/2015   . Low iron     hx  . Glaucoma   . Complication of anesthesia     use a little anesthesia , per patient MD states she quit breathing (2016)  . History of blood product transfusion    Past Surgical History  Procedure Laterality Date  . Cystoscopy w/ ureteral stent placement  04/04/2012    Procedure: CYSTOSCOPY WITH RETROGRADE PYELOGRAM/URETERAL STENT PLACEMENT;  Surgeon: Ailene Rud, MD;  Location: [place];  Service: Urology;  Laterality: Left;  . Total abdominal hysterectomy w/ bilateral salpingoophorectomy  1993    secondary to fibroids  . Breast biopsy Left 08/23/07    benign fibrocystic with duct ectasia  . Cardiac catheterization  07-11-2012  dr Irish Lack    Abnormal stress test/   normal coronary arteries/  LVEDP  55mmHg  . Cardiovascular stress  test  06-26-2012  dr Irish Lack    marked ischemia in the basal anterior, mid anterior, apical inferior regions/  normal LVF, ef 63%  . Transthoracic echocardiogram  04-09-2012    normal LVF,  ef 60-65%,  mild LAE,  mild TR, trivial MR and PR  . Cataract extraction w/ intraocular lens  implant, bilateral    . Knee arthroscopy Left 02-14-2003  . Laparoscopic cholecystectomy  03-23-2005  . Extracorporeal shock wave lithotripsy  05-28-2012  &  10-08-2012  . Cystoscopy with stent placement Right 10/28/2014    Procedure: RIGHT URETERAL STENT PLACEMENT;  Surgeon: Irine Seal, MD;  Location: [facility];  Service: Urology;  Laterality: Right;  . Cystoscopy/retrograde/ureteroscopy/stone extraction with basket Right 11/21/2014    Procedure: CYSTOSCOPY/RIGHT RETROGRADE PYELOGRAM/RIGHT URETEROSCOPY/BASKET  EXTRACTION/RIGHT PYELOSCOPY/LASER OF STONE/RIGHT DOUBLE J STENT;  Surgeon: Carolan Clines, MD;  Location: [place];  Service: Urology;  Laterality: Right;  . Holmium laser application Right [id number]    Procedure: HOLMIUM LASER APPLICATION;  Surgeon: Carolan Clines, MD;  Location: [facility];  Service: Urology;  Laterality: Right;  . Cystoscopy with stent placement Right 02/26/2015    Procedure: CYSTOSCOPY RETROGRADE PYELOGRAM WITH STENT PLACEMENT;  Surgeon: Cleon Gustin, MD;  Location: [facility];  Service: Urology;  Laterality: Right;  . Cystoscopy w/ ureteral stent placement Bilateral 05/04/2015    Procedure: CYSTOSCOPY WITH BILATERAL RETROGRADE PYELOGRAM/ WITH INTERPRETATION, EXCHANGE OF RIGHT URETERAL STENT REPLACEMENT AND PLACEMENT LEFT URETERAL STENT PLACEMENT EXAMINATION OF VAGINA;  Surgeon: Carolan Clines, MD;  Location: [facility];  Service: Urology;  Laterality: Bilateral;  . Av fistula placement Left 06/02/2015    Procedure: BRACHIOCEPHALIC ARTERIOVENOUS (AV) FISTULA CREATION ;  Surgeon: Conrad Garibaldi, MD;  Location: [place];  Service: Vascular;  Laterality: Left;  . Abdominal hysterectomy    . Bascilic vein transposition Left 07/27/2015    Procedure: FIRST STAGE BASILIC VEIN TRANSPOSITION LEFT UPPER ARM;  Surgeon: Conrad Antelope, MD;  [facility];  Service: Vascular;  Laterality: Left;   Family History  Problem Relation Age of Onset  . Hypertension Mother   . Cancer Mother 39    breast  . Dementia Mother   . Hypertension Brother   . Diabetes Brother   . Heart disease Brother     before age 32  . Cancer Father 49    pancreatic  . Heart failure Paternal Grandmother   . Bladder Cancer Maternal Grandfather    Social History  Substance Use Topics  . Smoking status: Never Smoker   . Smokeless tobacco: Never Used  . Alcohol Use: No   OB History    Gravida Para Term Preterm AB TAB SAB Ectopic Multiple Living   1 1 1       1      Review  of Systems  All other systems reviewed and are negative.     Allergies  Vicodin; Chlorhexidine; and Percocet  Home Medications   Prior to Admission medications   Medication Sig Start Date End Date Taking? Authorizing Provider  acetaminophen (TYLENOL) 500 MG tablet Take 500 mg by mouth every 6 (six) hours as needed for moderate pain or headache.    Yes Historical Provider, MD  bimatoprost (LUMIGAN) 0.01 % SOLN Place 1 drop into both eyes at bedtime.   Yes Historical Provider, MD  cefUROXime (CEFTIN) 250 MG tablet Take 250 mg by mouth daily as needed (prevent infection). Medication Instructions: Take 1 tablet the day before, Take 1 tablet the  day of and Take 1 tablet the following day of procedure. 07/21/15  Yes Historical Provider, MD  HYDROcodone-acetaminophen (NORCO/VICODIN) 5-325 MG tablet Take 1 tablet by mouth every 6 (six) hours as needed for moderate pain. 07/27/15  Yes Ulyses Amor, PA-C  metoprolol tartrate (LOPRESSOR) 25 MG tablet Take 12.5 mg by mouth 2 (two) times daily.  07/22/15  Yes Historical Provider, MD  ranitidine (ZANTAC) 150 MG tablet Take 150 mg by mouth 2 (two) times daily as needed for heartburn.   Yes Historical Provider, MD  saccharomyces boulardii (FLORASTOR) 250 MG capsule Take 1 capsule (250 mg total) by mouth 2 (two) times daily. 07/08/15  Yes Donne Hazel, MD  sodium bicarbonate 650 MG tablet Take 1 tablet (650 mg total) by mouth 2 (two) times daily. 05/30/15  Yes Robbie Lis, MD  timolol (BETIMOL) 0.5 % ophthalmic solution Place 1 drop into both eyes every morning.    Yes Historical Provider, MD  acidophilus (RISAQUAD) CAPS capsule Take 2 capsules by mouth daily. Patient not taking: Reported on 08/13/2015 05/07/15   Cristal Ford, DO  Nutritional Supplements (FEEDING SUPPLEMENT, NEPRO CARB STEADY,) LIQD Take 237 mLs by mouth daily. 05/07/15   Maryann Mikhail, DO  sodium chloride (OCEAN) 0.65 % SOLN nasal spray Place 1 spray into both nostrils 3 (three) times  daily as needed for congestion.    Historical Provider, MD   BP 117/60 mmHg  Pulse 103  Temp(Src) 98.7 F (37.1 C) (Oral)  Resp 19  Ht 5\' 3"  (1.6 m)  Wt 175 lb (79.379 kg)  BMI 31.01 kg/m2  SpO2 97%  LMP 01/19/1992 Physical Exam  Constitutional: She is oriented to person, place, and time. She appears well-developed and well-nourished.  HENT:  Head: Normocephalic and atraumatic.  Cardiovascular: Regular rhythm.   No murmur heard. tachycardic  Pulmonary/Chest: Effort normal and breath sounds normal. No respiratory distress.  Abdominal: Soft. There is no tenderness. There is no rebound and no guarding.  Nephrostomy tube in right flank.  Bag with blood tinged fluid.  Dressing is c/d/i.   Musculoskeletal: She exhibits no edema or tenderness.  Neurological: She is alert and oriented to person, place, and time.  Skin: Skin is warm and dry.  Psychiatric: She has a normal mood and affect. Her behavior is normal.  Nursing note and vitals reviewed.   ED Course  Procedures (including critical care time) Labs Review Labs Reviewed  COMPREHENSIVE METABOLIC PANEL - Abnormal; Notable for the following:    CO2 16 (*)    Glucose, Bld 141 (*)    BUN 74 (*)    Creatinine, Ser 5.36 (*)    Albumin 3.4 (*)    AST 14 (*)    ALT 10 (*)    GFR calc non Af Amer 7 (*)    GFR calc Af Amer 8 (*)    All other components within normal limits  CBC - Abnormal; Notable for the following:    WBC 19.1 (*)    RBC 3.42 (*)    Hemoglobin 9.3 (*)    HCT 29.9 (*)    RDW 16.5 (*)    All other components within normal limits  CBG MONITORING, ED - Abnormal; Notable for the following:    Glucose-Capillary 129 (*)    All other components within normal limits  CULTURE, BLOOD (ROUTINE X 2)  CULTURE, BLOOD (ROUTINE X 2)  URINE CULTURE  URINE CULTURE  URINALYSIS, ROUTINE W REFLEX MICROSCOPIC (NOT AT [facility])  URINALYSIS, ROUTINE W  REFLEX MICROSCOPIC (NOT AT [facility])  I-STAT CG4 LACTIC ACID, ED    Imaging  Review Dg Chest Port 1 View  08/13/2015  CLINICAL DATA:  Acute onset of fever. Recent nephrostomy tube exchange. Initial encounter. EXAM: PORTABLE CHEST 1 VIEW COMPARISON:  Chest radiograph performed 07/04/2015 FINDINGS: A right-sided dual-lumen catheter is noted ending about the mid SVC. Mild vascular congestion is noted. Minimal bibasilar atelectasis is seen. No pleural effusion or pneumothorax is identified. The cardiomediastinal silhouette is borderline normal in size. No acute osseous abnormalities are identified. IMPRESSION: Mild vascular congestion noted.  Mild bibasilar atelectasis seen. Electronically Signed   By: Garald Balding M.D.   On: 08/13/2015 23:16   Ir Nephrostomy Exchange Right  08/13/2015  INDICATION: 70 year old female with a history of persistent right-sided hydronephrosis despite ureteral stent placement. A percutaneous nephrostomy tube was placed on 04/11/2015. Patient presents today for routine exchange. EXAM: IR EXCHANGE NEPHROSTOMY RIGHT COMPARISON:  Prior nephrostomy exchange 07/03/15 MEDICATIONS: None ANESTHESIA/SEDATION: None required CONTRAST:  10 mL Omnipaque 350 - administered into the collecting system(s) FLUOROSCOPY TIME:  Fluoroscopy Time: 0 minutes 6 seconds COMPLICATIONS: None immediate. PROCEDURE: Informed written consent was obtained from the patient after a thorough discussion of the procedural risks, benefits and alternatives. All questions were addressed. Maximal Sterile Barrier Technique was utilized including caps, mask, sterile gowns, sterile gloves, sterile drape, hand hygiene and skin antiseptic. A timeout was performed prior to the initiation of the procedure. Of the right-sided percutaneous nephrostomy tube was injected by hand under fluoroscopy. The tube is well positioned within the renal pelvis. There is no hydronephrosis. Double-J ureteral stent is present. The nephrostomy tube was cut and removed over a wire. A new 10 French percutaneous all-purpose drainage  catheter was advanced over the wire and formed in the renal pelvis. The tube was secured to the skin with an adhesive fixation device. A gentle hand injection of contrast material confirmed its location within the renal pelvis. The patient tolerated the procedure well. IMPRESSION: Successful routine exchange of right-sided 10.2 French percutaneous nephrostomy tube. Signed, Criselda Peaches, MD [facility] Electronically Signed   By: Jacqulynn Cadet M.D.   On: 08/13/2015 15:21   I have personally reviewed and evaluated these images and lab results as part of my medical decision-making.   EKG Interpretation None      MDM   Final diagnoses:  Sepsis, due to unspecified organism ([place])    Pt with hx/o CKD, nephrostomy tube here with fever, nausea, chills after having nephrostomy tube changed today.  Pt tachycardic on ED arrival with leukocytosis.  Concern for recurrent infection given her sxs.  BMP near baseline.  Treating for potential sepsis with abx.  Gentle fluid hydration provided given her renal disease.  Pt with hx/o MRSA in her urine - covering with Vancomycin.  D/w hospitalist regarding admission for further treatment.      Quintella Reichert, MD 08/14/15 [phone]

## 2015-08-13 NOTE — ED Notes (Addendum)
Pt states she had nephrostomy drain changed today and complains of chills, confusion and nausea. Pt states the last time she had her nephrostomy drain changed she developed an infection with similar symptoms.

## 2015-08-13 NOTE — Progress Notes (Addendum)
Pharmacy Antibiotic Note  [name] is a 70 y.o. female admitted on 08/13/2015 with sepsis.  Pharmacy has been consulted for Zosyn/Vancomycin dosing.  Plan: Vancomycin 1Gm IV q48h  Goal 15-20 mg/L Zosyn 3.375 Gm x1 then 2.25 Gm IV q6h     Temp (24hrs), Avg:98.7 F (37.1 C), Min:98.7 F (37.1 C), Max:98.7 F (37.1 C)  No results for input(s): WBC, CREATININE, LATICACIDVEN, VANCOTROUGH, VANCOPEAK, VANCORANDOM, GENTTROUGH, GENTPEAK, GENTRANDOM, TOBRATROUGH, TOBRAPEAK, TOBRARND, AMIKACINPEAK, AMIKACINTROU, AMIKACIN in the last 168 hours.  Estimated Creatinine Clearance: 10 mL/min (by C-G formula based on Cr of 5.2).    Allergies  Allergen Reactions  . Vicodin [Hydrocodone-Acetaminophen] Nausea And Vomiting  . Chlorhexidine Rash    Sunburn    rash  . Percocet [Oxycodone-Acetaminophen] Nausea And Vomiting    Antimicrobials this admission: 2/23 zosyn >>  2/23 vancomycin >>   Dose adjustments this admission:   Microbiology results:  BCx:   UCx:    Sputum:    MRSA PCR:   Thank you for allowing pharmacy to be a part of this patient's care.  Dorrene German 08/13/2015 11:04 PM   Addendum:  Ordered Vanc 1Gm  x1   Wil check random vanc ~48 hours post dose and redose as appropriate  Thanks Dorrene German 08/14/2015 12:39 AM

## 2015-08-14 ENCOUNTER — Encounter (HOSPITAL_COMMUNITY): Payer: Self-pay | Admitting: Internal Medicine

## 2015-08-14 ENCOUNTER — Inpatient Hospital Stay (HOSPITAL_COMMUNITY): Admission: RE | Admit: 2015-08-14 | Payer: Medicare Other | Source: Ambulatory Visit

## 2015-08-14 DIAGNOSIS — D631 Anemia in chronic kidney disease: Secondary | ICD-10-CM | POA: Diagnosis present

## 2015-08-14 DIAGNOSIS — N189 Chronic kidney disease, unspecified: Secondary | ICD-10-CM | POA: Diagnosis present

## 2015-08-14 DIAGNOSIS — N390 Urinary tract infection, site not specified: Secondary | ICD-10-CM | POA: Diagnosis present

## 2015-08-14 DIAGNOSIS — A419 Sepsis, unspecified organism: Secondary | ICD-10-CM | POA: Diagnosis present

## 2015-08-14 LAB — CBC WITH DIFFERENTIAL/PLATELET
Basophils Absolute: 0 10*3/uL (ref 0.0–0.1)
Basophils Relative: 0 %
Eosinophils Absolute: 0.1 10*3/uL (ref 0.0–0.7)
Eosinophils Relative: 0 %
HCT: 36.9 % (ref 36.0–46.0)
Hemoglobin: 11.5 g/dL — ABNORMAL LOW (ref 12.0–15.0)
Lymphocytes Relative: 3 %
Lymphs Abs: 0.4 10*3/uL — ABNORMAL LOW (ref 0.7–4.0)
MCH: 27.1 pg (ref 26.0–34.0)
MCHC: 31.2 g/dL (ref 30.0–36.0)
MCV: 86.8 fL (ref 78.0–100.0)
Monocytes Absolute: 0.6 10*3/uL (ref 0.1–1.0)
Monocytes Relative: 5 %
Neutro Abs: 11.5 10*3/uL — ABNORMAL HIGH (ref 1.7–7.7)
Neutrophils Relative %: 92 %
Platelets: 251 10*3/uL (ref 150–400)
RBC: 4.25 MIL/uL (ref 3.87–5.11)
RDW: 16.4 % — ABNORMAL HIGH (ref 11.5–15.5)
WBC: 12.5 10*3/uL — ABNORMAL HIGH (ref 4.0–10.5)

## 2015-08-14 LAB — COMPREHENSIVE METABOLIC PANEL
ALT: 15 U/L (ref 14–54)
AST: 20 U/L (ref 15–41)
Albumin: 3 g/dL — ABNORMAL LOW (ref 3.5–5.0)
Alkaline Phosphatase: 86 U/L (ref 38–126)
Anion gap: 12 (ref 5–15)
BUN: 74 mg/dL — ABNORMAL HIGH (ref 6–20)
CO2: 18 mmol/L — ABNORMAL LOW (ref 22–32)
Calcium: 9.2 mg/dL (ref 8.9–10.3)
Chloride: 106 mmol/L (ref 101–111)
Creatinine, Ser: 5.25 mg/dL — ABNORMAL HIGH (ref 0.44–1.00)
GFR calc Af Amer: 9 mL/min — ABNORMAL LOW (ref >60)
GFR calc non Af Amer: 8 mL/min — ABNORMAL LOW (ref >60)
Glucose, Bld: 135 mg/dL — ABNORMAL HIGH (ref 65–99)
Potassium: 4.3 mmol/L (ref 3.5–5.1)
Sodium: 136 mmol/L (ref 135–145)
Total Bilirubin: 0.6 mg/dL (ref 0.3–1.2)
Total Protein: 6.4 g/dL — ABNORMAL LOW (ref 6.5–8.1)

## 2015-08-14 LAB — URINALYSIS, ROUTINE W REFLEX MICROSCOPIC
Bilirubin Urine: NEGATIVE
Glucose, UA: NEGATIVE mg/dL
Glucose, UA: NEGATIVE mg/dL
Ketones, ur: 15 mg/dL — AB
Ketones, ur: NEGATIVE mg/dL
Nitrite: POSITIVE — AB
Nitrite: POSITIVE — AB
Protein, ur: 100 mg/dL — AB
Protein, ur: 100 mg/dL — AB
Specific Gravity, Urine: 1.012 (ref 1.005–1.030)
Specific Gravity, Urine: 1.014 (ref 1.005–1.030)
pH: 5 (ref 5.0–8.0)
pH: 6 (ref 5.0–8.0)

## 2015-08-14 LAB — URINE MICROSCOPIC-ADD ON

## 2015-08-14 LAB — MRSA PCR SCREENING: MRSA by PCR: NEGATIVE

## 2015-08-14 MED ORDER — SODIUM CHLORIDE 0.9% FLUSH
3.0000 mL | Freq: Two times a day (BID) | INTRAVENOUS | Status: DC
  Administered 2015-08-14 – 2015-08-17 (×3): 3 mL via INTRAVENOUS

## 2015-08-14 MED ORDER — SALINE SPRAY 0.65 % NA SOLN
1.0000 | Freq: Three times a day (TID) | NASAL | Status: DC | PRN
  Administered 2015-08-14: 1 via NASAL
  Filled 2015-08-14: qty 44

## 2015-08-14 MED ORDER — ACETAMINOPHEN 325 MG PO TABS
650.0000 mg | ORAL_TABLET | Freq: Once | ORAL | Status: AC
  Administered 2015-08-14: 650 mg via ORAL
  Filled 2015-08-14: qty 2

## 2015-08-14 MED ORDER — FAMOTIDINE 20 MG PO TABS
20.0000 mg | ORAL_TABLET | Freq: Two times a day (BID) | ORAL | Status: DC
  Administered 2015-08-14 (×3): 20 mg via ORAL
  Filled 2015-08-14 (×3): qty 1

## 2015-08-14 MED ORDER — SODIUM BICARBONATE 650 MG PO TABS
650.0000 mg | ORAL_TABLET | Freq: Two times a day (BID) | ORAL | Status: DC
  Administered 2015-08-14 – 2015-08-19 (×12): 650 mg via ORAL
  Filled 2015-08-14 (×12): qty 1

## 2015-08-14 MED ORDER — NEPRO/CARBSTEADY PO LIQD
237.0000 mL | ORAL | Status: DC
  Administered 2015-08-14 – 2015-08-19 (×6): 237 mL via ORAL
  Filled 2015-08-14 (×6): qty 237

## 2015-08-14 MED ORDER — TIMOLOL MALEATE 0.5 % OP SOLN
1.0000 [drp] | Freq: Every morning | OPHTHALMIC | Status: DC
  Administered 2015-08-14 – 2015-08-19 (×6): 1 [drp] via OPHTHALMIC
  Filled 2015-08-14: qty 5

## 2015-08-14 MED ORDER — SACCHAROMYCES BOULARDII 250 MG PO CAPS
250.0000 mg | ORAL_CAPSULE | Freq: Two times a day (BID) | ORAL | Status: DC
  Administered 2015-08-14 – 2015-08-19 (×12): 250 mg via ORAL
  Filled 2015-08-14 (×12): qty 1

## 2015-08-14 MED ORDER — ONDANSETRON HCL 4 MG/2ML IJ SOLN: 4.0000 mg | Freq: Four times a day (QID) | INTRAMUSCULAR | Status: DC | PRN

## 2015-08-14 MED ORDER — SODIUM CHLORIDE 0.9 % IV SOLN
INTRAVENOUS | Status: DC
  Administered 2015-08-14 – 2015-08-18 (×4): via INTRAVENOUS

## 2015-08-14 MED ORDER — PIPERACILLIN-TAZOBACTAM IN DEX 2-0.25 GM/50ML IV SOLN
2.2500 g | Freq: Three times a day (TID) | INTRAVENOUS | Status: DC
  Administered 2015-08-14 – 2015-08-19 (×14): 2.25 g via INTRAVENOUS
  Filled 2015-08-14 (×16): qty 50

## 2015-08-14 MED ORDER — HEPARIN SODIUM (PORCINE) 1000 UNIT/ML IJ SOLN
3200.0000 [IU] | Freq: Once | INTRAMUSCULAR | Status: AC
  Administered 2015-08-14: 3200 [IU] via INTRAVENOUS
  Filled 2015-08-14: qty 3.2

## 2015-08-14 MED ORDER — LATANOPROST 0.005 % OP SOLN
1.0000 [drp] | Freq: Every day | OPHTHALMIC | Status: DC
  Administered 2015-08-14 – 2015-08-18 (×6): 1 [drp] via OPHTHALMIC
  Filled 2015-08-14: qty 2.5

## 2015-08-14 MED ORDER — ONDANSETRON HCL 4 MG/2ML IJ SOLN: 4.0000 mg | INTRAMUSCULAR | Status: DC | PRN

## 2015-08-14 MED ORDER — ACETAMINOPHEN 325 MG PO TABS
650.0000 mg | ORAL_TABLET | Freq: Four times a day (QID) | ORAL | Status: DC | PRN
  Administered 2015-08-14 – 2015-08-16 (×4): 650 mg via ORAL
  Filled 2015-08-14 (×4): qty 2

## 2015-08-14 MED ORDER — HYDROMORPHONE HCL 1 MG/ML IJ SOLN
1.0000 mg | INTRAMUSCULAR | Status: DC | PRN
  Administered 2015-08-14 (×2): 1 mg via INTRAVENOUS
  Filled 2015-08-14 (×3): qty 1

## 2015-08-14 MED ORDER — PROMETHAZINE HCL 25 MG/ML IJ SOLN
12.5000 mg | Freq: Four times a day (QID) | INTRAMUSCULAR | Status: DC | PRN
  Administered 2015-08-14: 12.5 mg via INTRAVENOUS
  Filled 2015-08-14: qty 1

## 2015-08-14 MED ORDER — PIPERACILLIN-TAZOBACTAM IN DEX 2-0.25 GM/50ML IV SOLN
2.2500 g | Freq: Four times a day (QID) | INTRAVENOUS | Status: DC
  Administered 2015-08-14 (×2): 2.25 g via INTRAVENOUS
  Filled 2015-08-14 (×2): qty 50

## 2015-08-14 NOTE — H&P (Signed)
[facility] History and Physical  [name] [id number] DOB: 11-24-1945 DOA: 08/13/2015  Referring physician: Quintella Reichert, M.D. PCP: [name], MD   Chief Complaint: Altered mental status.  HPI: [name] is a 70 y.o. female with a past medical history of GERD, glaucoma, urolithiasis, hyperlipidemia, history of MI and atrial fibrillation during septic shock in 2013, multiple episodes of UTI with sepsis usually following nephrostomy tube change who comes with altered mental status, tachycardia and fever.   Per patient and patient's daughter, she had her nephrostomy tube change in the morning, they have altered mental status associated with fever, chills, fatigue, nausea and hematuria on the nephrostomy tube and collecting bag.  When seen, the patient was feeling better after IV antibiotics and IV hydration. Workup in the ER reveals significant leukocytosis and significantly abnormal urinalysis.  Review of Systems:  Unable to fully review due to patient's MS.  Past Medical History  Diagnosis Date  . GERD (gastroesophageal reflux disease)   . Glaucoma   . H/O renal calculi 2002 & 2006  . H/O hiatal hernia   . Headache(784.0)     migraine  . Pneumonia     dx 10-06-2014 per CXR--  on 10-27-2014 pt states finished antibiotic and denies cough or fever  . Hyperlipidemia   . History of MI (myocardial infarction)     10/ 2013 in setting of Septic Shock  . History of atrial fibrillation     10/ 2013  in setting of Septic Shock  . History of CHF (congestive heart failure)     10/ 2013 in setting of septic shock  . Sigmoid diverticulosis   . Nephrolithiasis     bilateral  . CKD (chronic kidney disease), stage III     nephrologist-  dr Florene Glen  . Right ureteral stone   . History of acute respiratory failure     10/ 2013  -- ventilated in setting septic shock  . Dysrhythmia     Afib in 2013 when she had septic shock related to stone ureteral  obstruction  . Myocardial infarction [facility])     Was reported in 2013 during hospitalization with septic shock  . Septic shock ([place]) 04/04/2012  . Sepsis ([place])   . Hypertension     medication removed from regimen due to low blood pressure   . Peripheral vascular disease ([place])   . S/P hemodialysis catheter insertion ([place]) 04/11/2015     right anterior chest , only used once   . History of nephrostomy 04/11/2015     currently inplace 04/28/2015   . History of blood transfusion 04/13/2015   . Low iron     hx  . Glaucoma   . Complication of anesthesia     use a little anesthesia , per patient MD states she quit breathing (2016)  . History of blood product transfusion    Past Surgical History  Procedure Laterality Date  . Cystoscopy w/ ureteral stent placement  04/04/2012    Procedure: CYSTOSCOPY WITH RETROGRADE PYELOGRAM/URETERAL STENT PLACEMENT;  Surgeon: Ailene Rud, MD;  Location: [place];  Service: Urology;  Laterality: Left;  . Total abdominal hysterectomy w/ bilateral salpingoophorectomy  1993    secondary to fibroids  . Breast biopsy Left 08/23/07    benign fibrocystic with duct ectasia  . Cardiac catheterization  07-11-2012  dr Irish Lack    Abnormal stress test/   normal coronary arteries/  LVEDP  14mmHg  . Cardiovascular stress test  06-26-2012  dr  varanasi    marked ischemia in the basal anterior, mid anterior, apical inferior regions/  normal LVF, ef 63%  . Transthoracic echocardiogram  04-09-2012    normal LVF,  ef 60-65%,  mild LAE,  mild TR, trivial MR and PR  . Cataract extraction w/ intraocular lens  implant, bilateral    . Knee arthroscopy Left 02-14-2003  . Laparoscopic cholecystectomy  03-23-2005  . Extracorporeal shock wave lithotripsy  05-28-2012  &  10-08-2012  . Cystoscopy with stent placement Right 10/28/2014    Procedure: RIGHT URETERAL STENT PLACEMENT;  Surgeon: Irine Seal, MD;  Location: [facility];  Service: Urology;  Laterality: Right;    . Cystoscopy/retrograde/ureteroscopy/stone extraction with basket Right 11/21/2014    Procedure: CYSTOSCOPY/RIGHT RETROGRADE PYELOGRAM/RIGHT URETEROSCOPY/BASKET EXTRACTION/RIGHT PYELOSCOPY/LASER OF STONE/RIGHT DOUBLE J STENT;  Surgeon: Carolan Clines, MD;  Location: [place];  Service: Urology;  Laterality: Right;  . Holmium laser application Right [id number]    Procedure: HOLMIUM LASER APPLICATION;  Surgeon: Carolan Clines, MD;  Location: [facility];  Service: Urology;  Laterality: Right;  . Cystoscopy with stent placement Right 02/26/2015    Procedure: CYSTOSCOPY RETROGRADE PYELOGRAM WITH STENT PLACEMENT;  Surgeon: Cleon Gustin, MD;  Location: [facility];  Service: Urology;  Laterality: Right;  . Cystoscopy w/ ureteral stent placement Bilateral 05/04/2015    Procedure: CYSTOSCOPY WITH BILATERAL RETROGRADE PYELOGRAM/ WITH INTERPRETATION, EXCHANGE OF RIGHT URETERAL STENT REPLACEMENT AND PLACEMENT LEFT URETERAL STENT PLACEMENT EXAMINATION OF VAGINA;  Surgeon: Carolan Clines, MD;  Location: [facility];  Service: Urology;  Laterality: Bilateral;  . Av fistula placement Left 06/02/2015    Procedure: BRACHIOCEPHALIC ARTERIOVENOUS (AV) FISTULA CREATION ;  Surgeon: Conrad Beattie, MD;  Location: [place];  Service: Vascular;  Laterality: Left;  . Abdominal hysterectomy    . Bascilic vein transposition Left 07/27/2015    Procedure: FIRST STAGE BASILIC VEIN TRANSPOSITION LEFT UPPER ARM;  Surgeon: Conrad [place], MD;  Location: [place];  Service: Vascular;  Laterality: Left;   Social History:  reports that she has never smoked. She has never used smokeless tobacco. She reports that she does not drink alcohol or use illicit drugs.  Allergies  Allergen Reactions  . Vicodin [Hydrocodone-Acetaminophen] Nausea And Vomiting  . Chlorhexidine Rash    Sunburn    rash  . Percocet [Oxycodone-Acetaminophen] Nausea And Vomiting    Family History  Problem Relation Age of Onset  .  Hypertension Mother   . Cancer Mother 32    breast  . Dementia Mother   . Hypertension Brother   . Diabetes Brother   . Heart disease Brother     before age 27  . Cancer Father 39    pancreatic  . Heart failure Paternal Grandmother   . Bladder Cancer Maternal Grandfather     Prior to Admission medications   Medication Sig Start Date End Date Taking? Authorizing Provider  acetaminophen (TYLENOL) 500 MG tablet Take 500 mg by mouth every 6 (six) hours as needed for moderate pain or headache.    Yes Historical Provider, MD  bimatoprost (LUMIGAN) 0.01 % SOLN Place 1 drop into both eyes at bedtime.   Yes Historical Provider, MD  cefUROXime (CEFTIN) 250 MG tablet Take 250 mg by mouth daily as needed (prevent infection). Medication Instructions: Take 1 tablet the day before, Take 1 tablet the day of and Take 1 tablet the following day of procedure. 07/21/15  Yes Historical Provider, MD  HYDROcodone-acetaminophen (NORCO/VICODIN) 5-325 MG tablet Take 1  tablet by mouth every 6 (six) hours as needed for moderate pain. 07/27/15  Yes Ulyses Amor, PA-C  metoprolol tartrate (LOPRESSOR) 25 MG tablet Take 12.5 mg by mouth 2 (two) times daily.  07/22/15  Yes Historical Provider, MD  ranitidine (ZANTAC) 150 MG tablet Take 150 mg by mouth 2 (two) times daily as needed for heartburn.   Yes Historical Provider, MD  saccharomyces boulardii (FLORASTOR) 250 MG capsule Take 1 capsule (250 mg total) by mouth 2 (two) times daily. 07/08/15  Yes Donne Hazel, MD  sodium bicarbonate 650 MG tablet Take 1 tablet (650 mg total) by mouth 2 (two) times daily. 05/30/15  Yes Robbie Lis, MD  timolol (BETIMOL) 0.5 % ophthalmic solution Place 1 drop into both eyes every morning.    Yes Historical Provider, MD  acidophilus (RISAQUAD) CAPS capsule Take 2 capsules by mouth daily. Patient not taking: Reported on 08/13/2015 05/07/15   Cristal Ford, DO  Nutritional Supplements (FEEDING SUPPLEMENT, NEPRO CARB STEADY,) LIQD Take 237  mLs by mouth daily. 05/07/15   Maryann Mikhail, DO  sodium chloride (OCEAN) 0.65 % SOLN nasal spray Place 1 spray into both nostrils 3 (three) times daily as needed for congestion.    Historical Provider, MD   Physical Exam: Filed Vitals:   08/13/15 2330 08/14/15 0000 08/14/15 0030 08/14/15 0131  BP: 144/90 117/60 121/64 118/65  Pulse: 106 103 102 103  Temp:    99.3 F (37.4 C)  TempSrc:    Oral  Resp: 19 19 17 18   Height:    5\' 3"  (1.6 m)  Weight:    81.239 kg (179 lb 1.6 oz)  SpO2: 96% 97% 97% 98%    Wt Readings from Last 3 Encounters:  08/14/15 81.239 kg (179 lb 1.6 oz)  08/06/15 79.379 kg (175 lb)  07/30/15 79.379 kg (175 lb)    General:  Appears calm and comfortable Eyes: PERRL, normal lids, irises & conjunctiva ENT: grossly normal hearing, lips & tongue, oral mucosa mildly dry. Neck: no LAD, masses or thyromegaly Cardiovascular: Tachycardic, no m/r/g. No LE edema. Telemetry: Sinus tachycardia at 104 bpm  Respiratory: CTA bilaterally, no w/r/r. Normal respiratory effort. Abdomen: soft, ntnd. Right flank nephrostomy cath with dressing and draining                                     tube/collecting bag with hematuria. Skin: no rash or induration seen on limited exam Musculoskeletal: grossly normal tone BUE/BLE Psychiatric: grossly normal mood and affect, speech fluent and appropriate Neurologic: Awake, alert, oriented 3, grossly non-focal.          Labs on Admission:  Basic Metabolic Panel:  Recent Labs Lab 08/13/15 2300  NA 135  K 4.2  CL 107  CO2 16*  GLUCOSE 141*  BUN 74*  CREATININE 5.36*  CALCIUM 9.6   Liver Function Tests:  Recent Labs Lab 08/13/15 2300  AST 14*  ALT 10*  ALKPHOS 96  BILITOT 0.3  PROT 7.2  ALBUMIN 3.4*   CBC:  Recent Labs Lab 08/13/15 2300  WBC 19.1*  HGB 9.3*  HCT 29.9*  MCV 87.4  PLT 399    BNP (last 3 results)  Recent Labs  02/26/15 0530 05/23/15 0602 07/04/15 1014  BNP 35.3 37.2 183.0*     CBG:  Recent Labs Lab 08/13/15 2254  GLUCAP 129*    Radiological Exams on Admission: Dg Chest [facility]  1 View  08/13/2015  CLINICAL DATA:  Acute onset of fever. Recent nephrostomy tube exchange. Initial encounter. EXAM: PORTABLE CHEST 1 VIEW COMPARISON:  Chest radiograph performed 07/04/2015 FINDINGS: A right-sided dual-lumen catheter is noted ending about the mid SVC. Mild vascular congestion is noted. Minimal bibasilar atelectasis is seen. No pleural effusion or pneumothorax is identified. The cardiomediastinal silhouette is borderline normal in size. No acute osseous abnormalities are identified. IMPRESSION: Mild vascular congestion noted.  Mild bibasilar atelectasis seen. Electronically Signed   By: Garald Balding M.D.   On: 08/13/2015 23:16   Ir Nephrostomy Exchange Right  08/13/2015  INDICATION: 70 year old female with a history of persistent right-sided hydronephrosis despite ureteral stent placement. A percutaneous nephrostomy tube was placed on 04/11/2015. Patient presents today for routine exchange. EXAM: IR EXCHANGE NEPHROSTOMY RIGHT COMPARISON:  Prior nephrostomy exchange 07/03/15 MEDICATIONS: None ANESTHESIA/SEDATION: None required CONTRAST:  10 mL Omnipaque 350 - administered into the collecting system(s) FLUOROSCOPY TIME:  Fluoroscopy Time: 0 minutes 6 seconds COMPLICATIONS: None immediate. PROCEDURE: Informed written consent was obtained from the patient after a thorough discussion of the procedural risks, benefits and alternatives. All questions were addressed. Maximal Sterile Barrier Technique was utilized including caps, mask, sterile gowns, sterile gloves, sterile drape, hand hygiene and skin antiseptic. A timeout was performed prior to the initiation of the procedure. Of the right-sided percutaneous nephrostomy tube was injected by hand under fluoroscopy. The tube is well positioned within the renal pelvis. There is no hydronephrosis. Double-J ureteral stent is present. The  nephrostomy tube was cut and removed over a wire. A new 10 French percutaneous all-purpose drainage catheter was advanced over the wire and formed in the renal pelvis. The tube was secured to the skin with an adhesive fixation device. A gentle hand injection of contrast material confirmed its location within the renal pelvis. The patient tolerated the procedure well. IMPRESSION: Successful routine exchange of right-sided 10.2 French percutaneous nephrostomy tube. Signed, Criselda Peaches, MD [facility]iology Electronically Signed   By: Jacqulynn Cadet M.D.   On: 08/13/2015 15:21    EKG: Independently reviewed. Vent. rate 111 BPM PR interval 165 ms QRS duration 80 ms QT/QTc 312/424 ms P-R-T axes 63 91 55 Sinus tachycardia Right axis deviation  Assessment/Plan Principal Problem:   Sepsis secondary to UTI ([place]) Admit to telemetry. Continue IV Zosyn. Continue IV vancomycin. Follow-up blood cultures and sensitivity. Follow-up urine culture and sensitivity.  Active Problems:   Glaucoma Continue timolol drops. Continue Lumigan drops.    Essential hypertension, benign Hold antihypertensives for now. Monitor blood pressure closely. Resume antihypertensive medications once the patient has improved clinically.    Acute on chronic renal failure (HCC)   End stage renal disease ([place]) I will hydrate with 1 L of normal saline over 20 hours. Monitor BUN/creatinine and electrolytes. Consult nephrology if needed. The patient is HD access ready.    Anemia of renal disease Monitor hematocrit and hemoglobin.    Code Status: Full code. DVT Prophylaxis: SCDs. Family Communication: Her daughter was present in the ED room. Disposition Plan: Admit for IV antibiotic therapy and gentle/limited hydration.  Time spent: Over 70 minutes were used in the process of his admission.  Reubin Milan, M.D. [facility] Pager  [phone].

## 2015-08-14 NOTE — Progress Notes (Signed)
Pt seen and examined at bedside. She has known CKD, stage IV, now presented with malaise and ? Sepsis from UTI. Has had nephrostomy tubed placed, now some blood in it. Urology paged for consultation. Awaiting input. Continue IVF, ABX, CBC and BMP In AM.  Faye Ramsay, MD  [facility] Pager [phone]  If 7PM-7AM, please contact night-coverage [email or web address] Password [email or web address]

## 2015-08-14 NOTE — Progress Notes (Signed)
Pharmacy Antibiotic Note  [name] is a 70 y.o. female with hx multiple episodes of UTI with sepsis following nephrostomy tube change admitted on 2/23 with AMS, tachycardia, and fever following nephrostomy tube change.  Pharmacy consulted to start Vancomycin and Zosyn.   2/24: ESRD -SCr 5.25, CrCl ~10 (N11), WBC improving, still sl elevated.  U/A concerning for UTI.  Noted hx MRSA UTI in 12/'16.   Plan: Vancomycin 1g x1, then check level in 48 hours.  Adjust to Zosyn 2.25g IV q8h.  F/u renal fxn, cultures, levels, clinical course  Height: 5\' 3"  (160 cm) Weight: 179 lb 1.6 oz (81.239 kg) IBW/kg (Calculated) : 52.4  Temp (24hrs), Avg:99.6 F (37.6 C), Min:98.3 F (36.8 C), Max:102.4 F (39.1 C)   Recent Labs Lab 08/13/15 2300 08/13/15 2316 08/14/15 0608  WBC 19.1*  --  12.5*  CREATININE 5.36*  --  5.25*  LATICACIDVEN  --  1.31  --     Estimated Creatinine Clearance: 10.1 mL/min (by C-G formula based on Cr of 5.25).    Allergies  Allergen Reactions  . Vicodin [Hydrocodone-Acetaminophen] Nausea And Vomiting  . Chlorhexidine Rash    Sunburn    rash  . Percocet [Oxycodone-Acetaminophen] Nausea And Vomiting    Antimicrobials this admission: PTA ceftin 250mg  daily PRN (1 tab day before, day of, day after procedure -for nephrostomy tube changes?) 2/24 vancomycin >>  2/23 zosyn >>   Dose adjustments this admission: Zosyn 2.25g q6h --> 2.25g q8h  Microbiology results: 12/2 Urine Cx: MRSA (R-cipro,clinda, oxacillin, S-vanc MIC 1, bactrim, rifampin, inducible clinda, gent) 2/23 BCx: IP 2/24 UCx: IP (U/A - turbid, many bacteria, large leuks, nitrites, WBC) 2/24 MRSA PCR: negative  Thank you for allowing pharmacy to be a part of this patient's care.  Ralene Bathe, PharmD, BCPS 08/14/2015, 1:53 PM  Pager: [phone]

## 2015-08-15 DIAGNOSIS — N390 Urinary tract infection, site not specified: Secondary | ICD-10-CM

## 2015-08-15 DIAGNOSIS — N185 Chronic kidney disease, stage 5: Secondary | ICD-10-CM

## 2015-08-15 DIAGNOSIS — A419 Sepsis, unspecified organism: Secondary | ICD-10-CM

## 2015-08-15 DIAGNOSIS — A4152 Sepsis due to Pseudomonas: Principal | ICD-10-CM

## 2015-08-15 DIAGNOSIS — D631 Anemia in chronic kidney disease: Secondary | ICD-10-CM

## 2015-08-15 LAB — COMPREHENSIVE METABOLIC PANEL
ALT: 26 U/L (ref 14–54)
AST: 21 U/L (ref 15–41)
Albumin: 2.9 g/dL — ABNORMAL LOW (ref 3.5–5.0)
Alkaline Phosphatase: 81 U/L (ref 38–126)
Anion gap: 13 (ref 5–15)
BUN: 66 mg/dL — ABNORMAL HIGH (ref 6–20)
CO2: 15 mmol/L — ABNORMAL LOW (ref 22–32)
Calcium: 8.7 mg/dL — ABNORMAL LOW (ref 8.9–10.3)
Chloride: 106 mmol/L (ref 101–111)
Creatinine, Ser: 5.21 mg/dL — ABNORMAL HIGH (ref 0.44–1.00)
GFR calc Af Amer: 9 mL/min — ABNORMAL LOW (ref >60)
GFR calc non Af Amer: 8 mL/min — ABNORMAL LOW (ref >60)
Glucose, Bld: 96 mg/dL (ref 65–99)
Potassium: 4.4 mmol/L (ref 3.5–5.1)
Sodium: 134 mmol/L — ABNORMAL LOW (ref 135–145)
Total Bilirubin: 0.8 mg/dL (ref 0.3–1.2)
Total Protein: 6.2 g/dL — ABNORMAL LOW (ref 6.5–8.1)

## 2015-08-15 LAB — CBC WITH DIFFERENTIAL/PLATELET
Basophils Absolute: 0 10*3/uL (ref 0.0–0.1)
Basophils Relative: 0 %
Eosinophils Absolute: 0.4 10*3/uL (ref 0.0–0.7)
Eosinophils Relative: 3 %
HCT: 35 % — ABNORMAL LOW (ref 36.0–46.0)
Hemoglobin: 10.9 g/dL — ABNORMAL LOW (ref 12.0–15.0)
Lymphocytes Relative: 7 %
Lymphs Abs: 0.9 10*3/uL (ref 0.7–4.0)
MCH: 26.9 pg (ref 26.0–34.0)
MCHC: 31.1 g/dL (ref 30.0–36.0)
MCV: 86.4 fL (ref 78.0–100.0)
Monocytes Absolute: 0.9 10*3/uL (ref 0.1–1.0)
Monocytes Relative: 7 %
Neutro Abs: 10.2 10*3/uL — ABNORMAL HIGH (ref 1.7–7.7)
Neutrophils Relative %: 83 %
Platelets: 282 10*3/uL (ref 150–400)
RBC: 4.05 MIL/uL (ref 3.87–5.11)
RDW: 16.6 % — ABNORMAL HIGH (ref 11.5–15.5)
WBC: 12.3 10*3/uL — ABNORMAL HIGH (ref 4.0–10.5)

## 2015-08-15 LAB — VANCOMYCIN, RANDOM: Vancomycin Rm: 11 ug/mL

## 2015-08-15 MED ORDER — VANCOMYCIN HCL IN DEXTROSE 1-5 GM/200ML-% IV SOLN: 1000.0000 mg | Freq: Once | INTRAVENOUS | Status: DC

## 2015-08-15 MED ORDER — FAMOTIDINE 20 MG PO TABS
20.0000 mg | ORAL_TABLET | Freq: Every day | ORAL | Status: DC
  Administered 2015-08-15 – 2015-08-19 (×5): 20 mg via ORAL
  Filled 2015-08-15 (×5): qty 1

## 2015-08-15 MED ORDER — VANCOMYCIN HCL IN DEXTROSE 1-5 GM/200ML-% IV SOLN
1000.0000 mg | Freq: Once | INTRAVENOUS | Status: AC
  Administered 2015-08-15: 1000 mg via INTRAVENOUS
  Filled 2015-08-15: qty 200

## 2015-08-15 NOTE — Progress Notes (Signed)
Pharmacy Antibiotic Note  [name] is a 69 y.o. female with hx multiple episodes of UTI with sepsis following nephrostomy tube change admitted on 2/23 with AMS, tachycardia, and fever following nephrostomy tube change.  Pharmacy consulted to start Vancomycin and Zosyn.   2/25: ESRD - CrCl ~10, WBC improving, still sl elevated.  U/A concerning for UTI but also showed > 20 epithelial cells.  Noted hx MRSA UTI in 12/'16.   Plan:  Vancomycin 1gm IV x 1 based on random level this am, check random level Monday am to evaluate if re-dose needed.   Continue  Zosyn 2.25g IV q8h.   F/u renal fxn, cultures, levels, clinical course   Height: 5\' 3"  (160 cm) Weight: 178 lb 12.7 oz (81.1 kg) IBW/kg (Calculated) : 52.4  Temp (24hrs), Avg:98.3 F (36.8 C), Min:97.5 F (36.4 C), Max:99.3 F (37.4 C)   Recent Labs Lab 08/13/15 2300 08/13/15 2316 08/14/15 0608 08/15/15 0503 08/15/15 0720  WBC 19.1*  --  12.5* 12.3*  --   CREATININE 5.36*  --  5.25* 5.21*  --   LATICACIDVEN  --  1.31  --   --   --   VANCORANDOM  --   --   --   --  11    Estimated Creatinine Clearance: 10.1 mL/min (by C-G formula based on Cr of 5.21).    Allergies  Allergen Reactions  . Vicodin [Hydrocodone-Acetaminophen] Nausea And Vomiting  . Chlorhexidine Rash    Sunburn    rash  . Percocet [Oxycodone-Acetaminophen] Nausea And Vomiting    Antimicrobials this admission: PTA ceftin 250mg  daily PRN (1 tab day before, day of, day after procedure -for nephrostomy tube changes?) 2/24 vancomycin >>  2/23 zosyn >>   Dose adjustments this admission: Zosyn 2.25g q6h --> 2.25g q8h 2/25 0720 random vanco = 55mcg/ml (vanco 1gm given 2/24 at 00:12)  Microbiology results: 12/2 Urine Cx: MRSA (R-cipro,clinda, oxacillin, S-vanc MIC 1, bactrim, rifampin, inducible clinda, gent) 2/23 BCx: IP 2/24 UCx: IP (U/A - turbid, many bacteria, large leuks, nitrites, WBC, but also many epithelial cells) 2/24 Ucx (bag - ?  nephrostomy):  2/24 MRSA PCR: negative  Thank you for allowing pharmacy to be a part of this patient's care.  Ralene Bathe, PharmD, BCPS 08/15/2015, 8:34 AM  Pager: [phone]

## 2015-08-15 NOTE — Progress Notes (Signed)
Patient ID: [name], female   DOB: [birth year], 70 y.o.   MRN: [id number] Case d/w Dr. Earleen [place]; rec monitoring PCN for now since appears to be draining well; can reassess again on 2/27 for poss exchange if problems persist

## 2015-08-15 NOTE — Progress Notes (Signed)
Patient ID: [name], female   DOB: 1945-07-22, 70 y.o.   MRN: [id number]    Referring Physician(s): [facility]/Tannenbaum  Corrie Mckusick  Chief Complaint: Persistent right hydronephrosis, hematuria   Subjective: Pt familiar to IR service from recent rt PCN exchange on 2/23; pt has CKD and hx nephrolithiasis with rt PCN initially placed on 04/11/15; hydro has been present despite ureteral stenting; she presented to ED evening of PCN exchange with fever,AMS, tachycardia; currently with bloody urine in PCN bag but no visible hematuria on voiding. Pt alert; only has mild rt flank discomfort; denies N/V.   Allergies: Vicodin; Chlorhexidine; and Percocet  Medications: Prior to Admission medications   Medication Sig Start Date End Date Taking? Authorizing Provider  acetaminophen (TYLENOL) 500 MG tablet Take 500 mg by mouth every 6 (six) hours as needed for moderate pain or headache.    Yes Historical Provider, MD  bimatoprost (LUMIGAN) 0.01 % SOLN Place 1 drop into both eyes at bedtime.   Yes Historical Provider, MD  cefUROXime (CEFTIN) 250 MG tablet Take 250 mg by mouth daily as needed (prevent infection). Medication Instructions: Take 1 tablet the day before, Take 1 tablet the day of and Take 1 tablet the following day of procedure. 07/21/15  Yes Historical Provider, MD  metoprolol tartrate (LOPRESSOR) 25 MG tablet Take 12.5 mg by mouth 2 (two) times daily.  07/22/15  Yes Historical Provider, MD  ranitidine (ZANTAC) 150 MG tablet Take 150 mg by mouth 2 (two) times daily as needed for heartburn.   Yes Historical Provider, MD  saccharomyces boulardii (FLORASTOR) 250 MG capsule Take 1 capsule (250 mg total) by mouth 2 (two) times daily. 07/08/15  Yes Donne Hazel, MD  sodium bicarbonate 650 MG tablet Take 1 tablet (650 mg total) by mouth 2 (two) times daily. 05/30/15  Yes Robbie Lis, MD  timolol (BETIMOL) 0.5 % ophthalmic solution Place 1 drop into both eyes every morning.    Yes Historical  Provider, MD  acidophilus (RISAQUAD) CAPS capsule Take 2 capsules by mouth daily. Patient not taking: Reported on 08/13/2015 05/07/15   Cristal Ford, DO  Nutritional Supplements (FEEDING SUPPLEMENT, NEPRO CARB STEADY,) LIQD Take 237 mLs by mouth daily. 05/07/15   Maryann Mikhail, DO  sodium chloride (OCEAN) 0.65 % SOLN nasal spray Place 1 spray into both nostrils 3 (three) times daily as needed for congestion.    Historical Provider, MD     Vital Signs: BP 98/71 mmHg  Pulse 80  Temp(Src) 97.5 F (36.4 C) (Oral)  Resp 18  Ht 5\' 3"  (1.6 m)  Wt 178 lb 12.7 oz (81.1 kg)  BMI 31.68 kg/m2  SpO2 95%  LMP 01/19/1992  Physical Exam rt PCN intact, output today 200+ cc blood-tinged urine, yesterday 1.3 liters; insertion site ok, no leaking, mildly tender; urine cx pend  Imaging: Dg Chest Port 1 View  08/13/2015  CLINICAL DATA:  Acute onset of fever. Recent nephrostomy tube exchange. Initial encounter. EXAM: PORTABLE CHEST 1 VIEW COMPARISON:  Chest radiograph performed 07/04/2015 FINDINGS: A right-sided dual-lumen catheter is noted ending about the mid SVC. Mild vascular congestion is noted. Minimal bibasilar atelectasis is seen. No pleural effusion or pneumothorax is identified. The cardiomediastinal silhouette is borderline normal in size. No acute osseous abnormalities are identified. IMPRESSION: Mild vascular congestion noted.  Mild bibasilar atelectasis seen. Electronically Signed   By: Garald Balding M.D.   On: 08/13/2015 23:16   Ir Nephrostomy Exchange Right  08/13/2015  INDICATION: 70 year old female with a  history of persistent right-sided hydronephrosis despite ureteral stent placement. A percutaneous nephrostomy tube was placed on 04/11/2015. Patient presents today for routine exchange. EXAM: IR EXCHANGE NEPHROSTOMY RIGHT COMPARISON:  Prior nephrostomy exchange 07/03/15 MEDICATIONS: None ANESTHESIA/SEDATION: None required CONTRAST:  10 mL Omnipaque 350 - administered into the collecting  system(s) FLUOROSCOPY TIME:  Fluoroscopy Time: 0 minutes 6 seconds COMPLICATIONS: None immediate. PROCEDURE: Informed written consent was obtained from the patient after a thorough discussion of the procedural risks, benefits and alternatives. All questions were addressed. Maximal Sterile Barrier Technique was utilized including caps, mask, sterile gowns, sterile gloves, sterile drape, hand hygiene and skin antiseptic. A timeout was performed prior to the initiation of the procedure. Of the right-sided percutaneous nephrostomy tube was injected by hand under fluoroscopy. The tube is well positioned within the renal pelvis. There is no hydronephrosis. Double-J ureteral stent is present. The nephrostomy tube was cut and removed over a wire. A new 10 French percutaneous all-purpose drainage catheter was advanced over the wire and formed in the renal pelvis. The tube was secured to the skin with an adhesive fixation device. A gentle hand injection of contrast material confirmed its location within the renal pelvis. The patient tolerated the procedure well. IMPRESSION: Successful routine exchange of right-sided 10.2 French percutaneous nephrostomy tube. Signed, Criselda Peaches, MD [facility] Electronically Signed   By: Jacqulynn Cadet M.D.   On: 08/13/2015 15:21    Labs:  CBC:  Recent Labs  07/08/15 0700 07/27/15 0826 08/06/15 1033 08/13/15 2300 08/14/15 0608 08/15/15 0503  WBC 8.4  --   --  19.1* 12.5* 12.3*  HGB 11.6* 14.3 11.5* 9.3* 11.5* 10.9*  HCT 37.7 42.0  --  29.9* 36.9 35.0*  PLT 236  --   --  399 251 282    COAGS:  Recent Labs  02/26/15 0015 05/04/15 0435 05/23/15 1450 07/04/15 0340  INR 1.17 1.24 1.30 1.20  APTT 33  --   --  32    BMP:  Recent Labs  08/06/15 1035 08/13/15 2300 08/14/15 0608 08/15/15 0503  NA 139 135 136 134*  K 4.6 4.2 4.3 4.4  CL 107 107 106 106  CO2 20* 16* 18* 15*  GLUCOSE 109*  141* 135* 96  BUN 79* 74* 74* 66*  CALCIUM 9.4 9.6 9.2 8.7*  CREATININE 5.20* 5.36* 5.25* 5.21*  GFRNONAA 8* 7* 8* 8*  GFRAA 9* 8* 9* 9*    LIVER FUNCTION TESTS:  Recent Labs  07/03/15 2358  08/06/15 1035 08/13/15 2300 08/14/15 0608 08/15/15 0503  BILITOT 0.6  --   --  0.3 0.6 0.8  AST 18  --   --  14* 20 21  ALT 13*  --   --  10* 15 26  ALKPHOS 88  --   --  96 86 81  PROT 7.1  --   --  7.2 6.4* 6.2*  ALBUMIN 3.4*  < > 3.0* 3.4* 3.0* 2.9*  < > = values in this interval not displayed.  Assessment and Plan: S/p rt PCN exchange 2/23; now with hematuria via PCN, recent fever- currently AF; leukocytosis- improving, prob UTI- on vanc/zosyn;  hgb 10.9, plts 282k; creat 5.21(5.25)- CKD; cont current tx; await urology f/u; will review case with Dr. Earleen Newport this am   Electronically Signed: D. Rowe Robert 08/15/2015, 10:49 AM   I spent a total of 15 minutes at the the patient's bedside AND on the patient's hospital floor or unit, greater  than 50% of which was counseling/coordinating care for right nephrostomy

## 2015-08-15 NOTE — Progress Notes (Addendum)
Patient ID: [name], female   DOB: 1946/03/02, 70 y.o.   MRN: [id number] [facility] PROGRESS NOTE  [name] [id number] DOB: 09/08/1945 DOA: 08/13/2015 PCP: [name], MD  Brief narrative:    70 year old female with a past medical history of GERD, urolithiasis, hyperlipidemia, history of MI and atrial fibrillation during septic shock in 2013, multiple episodes of UTI with sepsis, has nephrostomy tube and had recent exchange of right nephrostomy 2/23.   Pt presented to [facility] ED with reports of altered ental status and fever and blood in nephrostomy. She was started on vanco and zosyn. Her urine culture is growing pseudomonas species. Blood cultures to date are negative.   Assessment/Plan:    Principal Problem:   Sepsis secondary to Pseudomonas UTI ([place]) / Leukocytosis  - Sepsis criteria met on admission  - Urine culture on admission grew pseudomonas, sensitivity report is pending  - Continue cefepime. Vanco is renally adjusted but will stop it since no evidence of MRSA and blood cultures negative  - Hemodynamically stable   Active Problems:   Essential hypertension, benign - On metoprolol at home but BP 114/56 so metoprolol on hold     Right percutaneous nephrostomy recent exchange 08/13/15 - Has had h/o nephrolithiasis with rt PCN initially placed on 04/11/15 - hydronephrosis has been present despite ureteral stenting - Seen by IR, observe for now    Acute renal failure superimposed on stage 5 chronic kidney disease (HCC) - Recent Cr 4.86 and on this admission 5.36 - Continue IV fluids - Monitor daily BMP    Severe protein-calorie malnutrition (HCC) - In the context of chronic illness - Nephro carb diet    Anemia of renal disease - Hemoglobin is 10.9, stable - No current indications for transfusion   DVT Prophylaxis  - SCD's bilaterally   Code Status: Full.  Family Communication:  plan of care discussed with the patient and her daughter at the  bedside  Disposition Plan: home by 2/27 or 2/28 depending on if pt feels better and if final urine culture with sensitivity report back   IV access:  Peripheral IV  Procedures and diagnostic studies:     Dg Chest Port 1 View 08/13/2015   Mild vascular congestion noted.  Mild bibasilar atelectasis seen. Electronically Signed   By: Garald Balding M.D.   On: 08/13/2015 23:16   Ir Nephrostomy Exchange Right 08/13/2015 Successful routine exchange of right-sided 10.2 French percutaneous nephrostomy tube.  Medical Consultants:  IR  IAnti-Infectives:   Vanco and zosyn 08/14/2015 -->   Leisa Lenz, MD  [facility] Pager [phone]  Time spent in minutes: 25 minutes  If 7PM-7AM, please contact night-coverage [email or web address] Password [facility] 08/15/2015, 5:17 PM   LOS: 2 days    HPI/Subjective: No acute overnight events. Patient reports she feels better since admission.   Objective: Filed Vitals:   08/15/15 0506 08/15/15 0554 08/15/15 0655 08/15/15 1415  BP:  109/50 98/71 114/56  Pulse:  92 80 87  Temp:  98 F (36.7 C) 97.5 F (36.4 C) 97.9 F (36.6 C)  TempSrc:  Oral Oral Oral  Resp:  18 18   Height:      Weight: 81.1 kg (178 lb 12.7 oz)     SpO2:  98% 95% 100%    Intake/Output Summary (Last 24 hours) at 08/15/15 1717 Last data filed at 08/15/15 1300  Gross per 24 hour  Intake    820 ml  Output   1225 ml  Net   -405 ml    Exam:   General:  Pt is alert, follows commands appropriately, not in acute distress  Cardiovascular: Regular rate and rhythm, S1/S2 appreciated   Respiratory: Clear to auscultation bilaterally, no wheezing, no crackles, no rhonchi  Abdomen: Soft, non tender, non distended, bowel sounds present; (+) right nephrostomy with blood in the tube   Extremities: No edema, pulses palpable bilaterally  Neuro: Grossly nonfocal  Data Reviewed: Basic Metabolic Panel:  Recent Labs Lab 08/13/15 2300 08/14/15 0608 08/15/15 0503  NA 135 136 134*   K 4.2 4.3 4.4  CL 107 106 106  CO2 16* 18* 15*  GLUCOSE 141* 135* 96  BUN 74* 74* 66*  CREATININE 5.36* 5.25* 5.21*  CALCIUM 9.6 9.2 8.7*   Liver Function Tests:  Recent Labs Lab 08/13/15 2300 08/14/15 0608 08/15/15 0503  AST 14* 20 21  ALT 10* 15 26  ALKPHOS 96 86 81  BILITOT 0.3 0.6 0.8  PROT 7.2 6.4* 6.2*  ALBUMIN 3.4* 3.0* 2.9*   No results for input(s): LIPASE, AMYLASE in the last 168 hours. No results for input(s): AMMONIA in the last 168 hours. CBC:  Recent Labs Lab 08/13/15 2300 08/14/15 0608 08/15/15 0503  WBC 19.1* 12.5* 12.3*  NEUTROABS  --  11.5* 10.2*  HGB 9.3* 11.5* 10.9*  HCT 29.9* 36.9 35.0*  MCV 87.4 86.8 86.4  PLT 399 251 282   Cardiac Enzymes: No results for input(s): CKTOTAL, CKMB, CKMBINDEX, TROPONINI in the last 168 hours. BNP: Invalid input(s): POCBNP CBG:  Recent Labs Lab 08/13/15 2254  GLUCAP 129*    Recent Results (from the past 240 hour(s))  Blood Culture (routine x 2)     Status: None (Preliminary result)   Collection Time: 08/13/15 10:52 PM  Result Value Ref Range Status   Specimen Description BLOOD RIGHT HAND  Final   Special Requests BOTTLES DRAWN AEROBIC AND ANAEROBIC 5CC  Final   Culture   Final    NO GROWTH 1 DAY Performed at [facility]    Report Status PENDING  Incomplete  Blood Culture (routine x 2)     Status: None (Preliminary result)   Collection Time: 08/13/15 10:58 PM  Result Value Ref Range Status   Specimen Description BLOOD RIGHT THUMB  Final   Special Requests BOTTLES DRAWN AEROBIC AND ANAEROBIC 5CC  Final   Culture   Final    NO GROWTH 1 DAY Performed at [facility]    Report Status PENDING  Incomplete  Urine culture     Status: None (Preliminary result)   Collection Time: 08/14/15 12:15 AM  Result Value Ref Range Status   Specimen Description URINE, CLEAN CATCH  Final   Special Requests NONE  Final   Culture   Final    >=100,000 COLONIES/mL PSEUDOMONAS AERUGINOSA Performed  at [facility]    Report Status PENDING  Incomplete  MRSA PCR Screening     Status: None   Collection Time: 08/14/15  2:14 AM  Result Value Ref Range Status   MRSA by PCR NEGATIVE NEGATIVE Final    Comment:        The GeneXpert MRSA Assay (FDA approved for NASAL specimens only), is one component of a comprehensive MRSA colonization surveillance program. It is not intended to diagnose MRSA infection nor to guide or monitor treatment for MRSA infections.   Urine culture     Status: None (Preliminary result)   Collection Time: 08/14/15  3:07 AM  Result  Value Ref Range Status   Specimen Description URINE, CLEAN CATCH  Final   Special Requests NONE  Final   Culture   Final    CULTURE REINCUBATED FOR BETTER GROWTH Performed at [facility]    Report Status PENDING  Incomplete     Scheduled Meds: . famotidine  20 mg Oral Daily  . feeding supplement (NEPRO CARB STEADY)  237 mL Oral Q24H  . latanoprost  1 drop Both Eyes QHS  . piperacillin-tazobactam (ZOSYN)  IV  2.25 g Intravenous 3 times per day  . saccharomyces boulardii  250 mg Oral BID  . sodium bicarbonate  650 mg Oral BID  . sodium chloride flush  3 mL Intravenous Q12H  . timolol  1 drop Both Eyes q morning - 10a   Continuous Infusions: . sodium chloride 50 mL/hr at 08/14/15 1008

## 2015-08-16 DIAGNOSIS — N186 End stage renal disease: Secondary | ICD-10-CM

## 2015-08-16 DIAGNOSIS — N189 Chronic kidney disease, unspecified: Secondary | ICD-10-CM

## 2015-08-16 LAB — URINE CULTURE
Culture: >=100000
Culture: >=100000

## 2015-08-16 LAB — CBC WITH DIFFERENTIAL/PLATELET
Basophils Absolute: 0 10*3/uL (ref 0.0–0.1)
Basophils Relative: 1 %
Eosinophils Absolute: 0.5 10*3/uL (ref 0.0–0.7)
Eosinophils Relative: 7 %
HCT: 32.5 % — ABNORMAL LOW (ref 36.0–46.0)
Hemoglobin: 10.3 g/dL — ABNORMAL LOW (ref 12.0–15.0)
Lymphocytes Relative: 16 %
Lymphs Abs: 1.1 10*3/uL (ref 0.7–4.0)
MCH: 27.1 pg (ref 26.0–34.0)
MCHC: 31.7 g/dL (ref 30.0–36.0)
MCV: 85.5 fL (ref 78.0–100.0)
Monocytes Absolute: 0.5 10*3/uL (ref 0.1–1.0)
Monocytes Relative: 7 %
Neutro Abs: 5 10*3/uL (ref 1.7–7.7)
Neutrophils Relative %: 69 %
Platelets: 250 10*3/uL (ref 150–400)
RBC: 3.8 MIL/uL — ABNORMAL LOW (ref 3.87–5.11)
RDW: 16.5 % — ABNORMAL HIGH (ref 11.5–15.5)
WBC: 7.1 10*3/uL (ref 4.0–10.5)

## 2015-08-16 LAB — COMPREHENSIVE METABOLIC PANEL
ALT: 17 U/L (ref 14–54)
AST: 9 U/L — ABNORMAL LOW (ref 15–41)
Albumin: 2.6 g/dL — ABNORMAL LOW (ref 3.5–5.0)
Alkaline Phosphatase: 67 U/L (ref 38–126)
Anion gap: 12 (ref 5–15)
BUN: 65 mg/dL — ABNORMAL HIGH (ref 6–20)
CO2: 18 mmol/L — ABNORMAL LOW (ref 22–32)
Calcium: 8.8 mg/dL — ABNORMAL LOW (ref 8.9–10.3)
Chloride: 108 mmol/L (ref 101–111)
Creatinine, Ser: 5.02 mg/dL — ABNORMAL HIGH (ref 0.44–1.00)
GFR calc Af Amer: 9 mL/min — ABNORMAL LOW (ref >60)
GFR calc non Af Amer: 8 mL/min — ABNORMAL LOW (ref >60)
Glucose, Bld: 94 mg/dL (ref 65–99)
Potassium: 3.8 mmol/L (ref 3.5–5.1)
Sodium: 138 mmol/L (ref 135–145)
Total Bilirubin: 0.4 mg/dL (ref 0.3–1.2)
Total Protein: 5.7 g/dL — ABNORMAL LOW (ref 6.5–8.1)

## 2015-08-16 MED ORDER — POLYETHYLENE GLYCOL 3350 17 G PO PACK: 17.0000 g | PACK | Freq: Every day | ORAL | Status: DC | PRN

## 2015-08-16 NOTE — Progress Notes (Signed)
Patient ID: [name], female   DOB: 10/22/1945, 70 y.o.   MRN: [id number] [facility] PROGRESS NOTE  [name] [id number] DOB: 09/08/1945 DOA: 08/13/2015 PCP: [name], MD  Brief narrative:    70 year old female with a past medical history of GERD, urolithiasis, hyperlipidemia, history of MI and atrial fibrillation during septic shock in 2013, multiple episodes of UTI with sepsis, has nephrostomy tube and had recent exchange of right nephrostomy 2/23.   Pt presented to [facility] ED with reports of altered ental status and fever and blood in nephrostomy. She was started on vanco and zosyn. Her urine culture is growing pseudomonas species. Blood cultures to date are negative.   Assessment/Plan:    Principal Problem:   Sepsis secondary to Pseudomonas UTI ([place]) / Leukocytosis  - Sepsis criteria met on admission  - Urine culture on admission grew pseudomonas, sensitivity report indicates Zosyn is adequate and will continue  - vancomycin has been discontinued  - Hemodynamically stable  - WBC is now WNL  Active Problems:   Essential hypertension, benign - On metoprolol at home - reasonable inpatient control     Right percutaneous nephrostomy recent exchange 08/13/15 - Has had h/o nephrolithiasis with rt PCN initially placed on 04/11/15 - hydronephrosis has been present despite ureteral stenting - Seen by IR, observe for now, appreciate assistance     Acute renal failure superimposed on stage 5 chronic kidney disease (HCC) - Recent Cr 4.86 and on this admission 5.36 - Cr trending down  - BMP In AM    Hyponatremia  - pre renal in etiology - resolved     Severe protein-calorie malnutrition ([place]) - In the context of chronic illness - Nephro carb diet    Anemia of renal disease - Hemoglobin is 10.3, stable - No current indications for transfusion   DVT Prophylaxis  - SCD's bilaterally   Code Status: Full.  Family Communication:  plan of care discussed  with the patient  Disposition Plan: home by 2/27 or 2/28 depending on if pt feels better   IV access:  Peripheral IV  Procedures and diagnostic studies:     Dg Chest Port 1 View 08/13/2015   Mild vascular congestion noted.  Mild bibasilar atelectasis seen. Electronically Signed   By: Garald Balding M.D.   On: 08/13/2015 23:16   Ir Nephrostomy Exchange Right 08/13/2015 Successful routine exchange of right-sided 10.2 French percutaneous nephrostomy tube.  Medical Consultants:  IR  IAnti-Infectives:   Vanco 2/24 --> 2/26 Zosyn 08/14/2015 -->  MAGICK-Colden Samaras, Rebecca Eaton, MD  [facility] Pager [phone]  Time spent in minutes: 25 minutes  If 7PM-7AM, please contact night-coverage [email or web address] Password [facility] 08/16/2015, 3:14 PM   LOS: 3 days    HPI/Subjective: No acute overnight events. Patient reports she feels better since admission.   Objective: Filed Vitals:   08/15/15 1415 08/15/15 2129 08/16/15 0628 08/16/15 1450  BP: 114/56 108/60 122/67 126/50  Pulse: 87 80 92 88  Temp: 97.9 F (36.6 C) 97.8 F (36.6 C) 97.4 F (36.3 C) 97.3 F (36.3 C)  TempSrc: Oral Oral Oral Oral  Resp:  '20 20 20  '$ Height:      Weight:   80.4 kg (177 lb 4 oz)   SpO2: 100% 99% 92% 98%    Intake/Output Summary (Last 24 hours) at 08/16/15 1514 Last data filed at 08/16/15 1451  Gross per 24 hour  Intake    940 ml  Output   1350 ml  Net   -  410 ml    Exam:   General:  Pt is alert, follows commands appropriately, not in acute distress  Cardiovascular: Regular rate and rhythm, S1/S2 appreciated   Respiratory: Clear to auscultation bilaterally, no wheezing, no crackles, no rhonchi  Abdomen: Soft, non tender, non distended, bowel sounds present; (+) right nephrostomy with blood in the tube   Extremities: No edema, pulses palpable bilaterally  Neuro: Grossly nonfocal  Data Reviewed: Basic Metabolic Panel:  Recent Labs Lab 08/13/15 2300 08/14/15 0608 08/15/15 0503 08/16/15 0513   NA 135 136 134* 138  K 4.2 4.3 4.4 3.8  CL 107 106 106 108  CO2 16* 18* 15* 18*  GLUCOSE 141* 135* 96 94  BUN 74* 74* 66* 65*  CREATININE 5.36* 5.25* 5.21* 5.02*  CALCIUM 9.6 9.2 8.7* 8.8*   Liver Function Tests:  Recent Labs Lab 08/13/15 2300 08/14/15 0608 08/15/15 0503 08/16/15 0513  AST 14* 20 21 9*  ALT 10* '15 26 17  '$ ALKPHOS 96 86 81 67  BILITOT 0.3 0.6 0.8 0.4  PROT 7.2 6.4* 6.2* 5.7*  ALBUMIN 3.4* 3.0* 2.9* 2.6*   CBC:  Recent Labs Lab 08/13/15 2300 08/14/15 0608 08/15/15 0503 08/16/15 0513  WBC 19.1* 12.5* 12.3* 7.1  NEUTROABS  --  11.5* 10.2* 5.0  HGB 9.3* 11.5* 10.9* 10.3*  HCT 29.9* 36.9 35.0* 32.5*  MCV 87.4 86.8 86.4 85.5  PLT 399 251 282 250   CBG:  Recent Labs Lab 08/13/15 2254  GLUCAP 129*    Recent Results (from the past 240 hour(s))  Blood Culture (routine x 2)     Status: None (Preliminary result)   Collection Time: 08/13/15 10:52 PM  Result Value Ref Range Status   Specimen Description BLOOD RIGHT HAND  Final   Special Requests BOTTLES DRAWN AEROBIC AND ANAEROBIC 5CC  Final   Culture   Final    NO GROWTH 2 DAYS Performed at [facility]    Report Status PENDING  Incomplete  Blood Culture (routine x 2)     Status: None (Preliminary result)   Collection Time: 08/13/15 10:58 PM  Result Value Ref Range Status   Specimen Description BLOOD RIGHT THUMB  Final   Special Requests BOTTLES DRAWN AEROBIC AND ANAEROBIC 5CC  Final   Culture   Final    NO GROWTH 2 DAYS Performed at [facility]    Report Status PENDING  Incomplete  Urine culture     Status: None   Collection Time: 08/14/15 12:15 AM  Result Value Ref Range Status   Specimen Description URINE, CLEAN CATCH  Final   Special Requests NONE  Final   Culture   Final    >=100,000 COLONIES/mL PSEUDOMONAS AERUGINOSA Performed at [facility]    Report Status 08/16/2015 FINAL  Final   Organism ID, Bacteria PSEUDOMONAS AERUGINOSA  Final      Susceptibility    Pseudomonas aeruginosa - MIC*    CEFTAZIDIME 4 SENSITIVE Sensitive     CIPROFLOXACIN <=0.25 SENSITIVE Sensitive     GENTAMICIN <=1 SENSITIVE Sensitive     IMIPENEM 2 SENSITIVE Sensitive     PIP/TAZO 8 SENSITIVE Sensitive     CEFEPIME 2 SENSITIVE Sensitive     * >=100,000 COLONIES/mL PSEUDOMONAS AERUGINOSA  MRSA PCR Screening     Status: None   Collection Time: 08/14/15  2:14 AM  Result Value Ref Range Status   MRSA by PCR NEGATIVE NEGATIVE Final    Comment:  The GeneXpert MRSA Assay (FDA approved for NASAL specimens only), is one component of a comprehensive MRSA colonization surveillance program. It is not intended to diagnose MRSA infection nor to guide or monitor treatment for MRSA infections.   Urine culture     Status: None   Collection Time: 08/14/15  3:07 AM  Result Value Ref Range Status   Specimen Description URINE, CLEAN CATCH  Final   Special Requests NONE  Final   Culture   Final    >=100,000 COLONIES/mL PSEUDOMONAS AERUGINOSA SUSCEPTIBILITIES PERFORMED ON PREVIOUS CULTURE WITHIN THE LAST 5 DAYS. Performed at [facility]    Report Status 08/16/2015 FINAL  Final     Scheduled Meds: . famotidine  20 mg Oral Daily  . feeding supplement (NEPRO CARB STEADY)  237 mL Oral Q24H  . latanoprost  1 drop Both Eyes QHS  . piperacillin-tazobactam (ZOSYN)  IV  2.25 g Intravenous 3 times per day  . saccharomyces boulardii  250 mg Oral BID  . sodium bicarbonate  650 mg Oral BID  . sodium chloride flush  3 mL Intravenous Q12H  . timolol  1 drop Both Eyes q morning - 10a   Continuous Infusions: . sodium chloride 50 mL/hr at 08/14/15 1008

## 2015-08-17 DIAGNOSIS — N179 Acute kidney failure, unspecified: Secondary | ICD-10-CM

## 2015-08-17 DIAGNOSIS — N184 Chronic kidney disease, stage 4 (severe): Secondary | ICD-10-CM

## 2015-08-17 DIAGNOSIS — I10 Essential (primary) hypertension: Secondary | ICD-10-CM

## 2015-08-17 DIAGNOSIS — B965 Pseudomonas (aeruginosa) (mallei) (pseudomallei) as the cause of diseases classified elsewhere: Secondary | ICD-10-CM

## 2015-08-17 LAB — BASIC METABOLIC PANEL
Anion gap: 12 (ref 5–15)
BUN: 60 mg/dL — ABNORMAL HIGH (ref 6–20)
CO2: 20 mmol/L — ABNORMAL LOW (ref 22–32)
Calcium: 9.2 mg/dL (ref 8.9–10.3)
Chloride: 109 mmol/L (ref 101–111)
Creatinine, Ser: 5.21 mg/dL — ABNORMAL HIGH (ref 0.44–1.00)
GFR calc Af Amer: 9 mL/min — ABNORMAL LOW (ref >60)
GFR calc non Af Amer: 8 mL/min — ABNORMAL LOW (ref >60)
Glucose, Bld: 93 mg/dL (ref 65–99)
Potassium: 3.9 mmol/L (ref 3.5–5.1)
Sodium: 141 mmol/L (ref 135–145)

## 2015-08-17 LAB — CBC
HCT: 36.3 % (ref 36.0–46.0)
Hemoglobin: 11.3 g/dL — ABNORMAL LOW (ref 12.0–15.0)
MCH: 26.8 pg (ref 26.0–34.0)
MCHC: 31.1 g/dL (ref 30.0–36.0)
MCV: 86.2 fL (ref 78.0–100.0)
Platelets: 325 10*3/uL (ref 150–400)
RBC: 4.21 MIL/uL (ref 3.87–5.11)
RDW: 16.5 % — ABNORMAL HIGH (ref 11.5–15.5)
WBC: 7.8 10*3/uL (ref 4.0–10.5)

## 2015-08-17 NOTE — Progress Notes (Signed)
PT Cancellation Note  Patient Details Name: [name] MRN: [id number] DOB: 1946/04/16   Cancelled Treatment:    Reason Eval/Treat Not Completed: PT screened, no needs identified, will sign off   [facility] 08/17/2015, 11:52 AM

## 2015-08-17 NOTE — Progress Notes (Signed)
Referring Physician(s): Dr. Charlies Silvers  Supervising Physician Hoss, Arnell Sieving  Chief Complaint: Hematuria from (R)PCN  Subjective: Pt had (R)PCN change on 2/23 and has had gross hematuria since. She has been admitted and being treated for UTI. Urine remains bloody from (R)PCN, tough she actually thinks it's thinner and less bloody than yesterday. She denies flank pain  Allergies: Vicodin; Chlorhexidine; and Percocet  Medications:  Current facility-administered medications:  .  0.9 %  sodium chloride infusion, , Intravenous, Continuous, Reubin Milan, MD, Last Rate: 50 mL/hr at 08/17/15 0550 .  acetaminophen (TYLENOL) tablet 650 mg, 650 mg, Oral, Q6H PRN, Gardiner Barefoot, NP, 650 mg at 08/16/15 1639 .  famotidine (PEPCID) tablet 20 mg, 20 mg, Oral, Daily, Berton Mount, RPH, 20 mg at 08/17/15 0854 .  feeding supplement (NEPRO CARB STEADY) liquid 237 mL, 237 mL, Oral, Q24H, Reubin Milan, MD, 237 mL at 08/17/15 0854 .  HYDROmorphone (DILAUDID) injection 1 mg, 1 mg, Intravenous, Q2H PRN, Theodis Blaze, MD, 1 mg at 08/14/15 2240 .  latanoprost (XALATAN) 0.005 % ophthalmic solution 1 drop, 1 drop, Both Eyes, QHS, Reubin Milan, MD, 1 drop at 08/16/15 2135 .  ondansetron (ZOFRAN) injection 4 mg, 4 mg, Intravenous, Q4H PRN, Theodis Blaze, MD .  piperacillin-tazobactam (ZOSYN) IVPB 2.25 g, 2.25 g, Intravenous, 3 times per day, Theodis Blaze, MD, 2.25 g at 08/17/15 1351 .  polyethylene glycol (MIRALAX / GLYCOLAX) packet 17 g, 17 g, Oral, Daily PRN, Theodis Blaze, MD .  promethazine (PHENERGAN) injection 12.5 mg, 12.5 mg, Intravenous, Q6H PRN, Theodis Blaze, MD, 12.5 mg at 08/14/15 1051 .  saccharomyces boulardii (FLORASTOR) capsule 250 mg, 250 mg, Oral, BID, Reubin Milan, MD, 250 mg at 08/17/15 [phone] .  sodium bicarbonate tablet 650 mg, 650 mg, Oral, BID, Reubin Milan, MD, 650 mg at 08/17/15 0854 .  sodium chloride (OCEAN) 0.65 % nasal spray 1 spray, 1 spray,  Each Nare, TID PRN, Reubin Milan, MD, 1 spray at 08/14/15 0207 .  sodium chloride flush (NS) 0.9 % injection 3 mL, 3 mL, Intravenous, Q12H, Reubin Milan, MD, 3 mL at 08/14/15 2245 .  timolol (TIMOPTIC) 0.5 % ophthalmic solution 1 drop, 1 drop, Both Eyes, q morning - 10a, Reubin Milan, MD, 1 drop at 08/17/15 0855    Vital Signs: BP 119/57 mmHg  Pulse 84  Temp(Src) 97.4 F (36.3 C) (Oral)  Resp 20  Ht 5\' 3"  (1.6 m)  Wt 178 lb 3.2 oz (80.831 kg)  BMI 31.57 kg/m2  SpO2 99%  LMP 01/19/1992  Physical Exam  Constitutional: She appears well-developed and well-nourished. No distress.  Abdominal: Soft. She exhibits no distension.  (R)flank PCN intact, site clean. No evidence to suggest tube retraction Output thin, blood tinged 1640mL recorded.    Imaging: Dg Chest Port 1 View  08/13/2015  CLINICAL DATA:  Acute onset of fever. Recent nephrostomy tube exchange. Initial encounter. EXAM: PORTABLE CHEST 1 VIEW COMPARISON:  Chest radiograph performed 07/04/2015 FINDINGS: A right-sided dual-lumen catheter is noted ending about the mid SVC. Mild vascular congestion is noted. Minimal bibasilar atelectasis is seen. No pleural effusion or pneumothorax is identified. The cardiomediastinal silhouette is borderline normal in size. No acute osseous abnormalities are identified. IMPRESSION: Mild vascular congestion noted.  Mild bibasilar atelectasis seen. Electronically Signed   By: Garald Balding M.D.   On: 08/13/2015 23:16    Labs:  CBC:  Recent Labs  08/14/15 [id number] 08/15/15  0503 08/16/15 0513 08/17/15 0453  WBC 12.5* 12.3* 7.1 7.8  HGB 11.5* 10.9* 10.3* 11.3*  HCT 36.9 35.0* 32.5* 36.3  PLT 251 282 250 325    COAGS:  Recent Labs  02/26/15 0015 05/04/15 0435 05/23/15 1450 07/04/15 0340  INR 1.17 1.24 1.30 1.20  APTT 33  --   --  32    BMP:  Recent Labs  08/14/15 0608 08/15/15 0503 08/16/15 0513 08/17/15 0453  NA 136 134* 138 141  K 4.3 4.4 3.8 3.9  CL 106  106 108 109  CO2 18* 15* 18* 20*  GLUCOSE 135* 96 94 93  BUN 74* 66* 65* 60*  CALCIUM 9.2 8.7* 8.8* 9.2  CREATININE 5.25* 5.21* 5.02* 5.21*  GFRNONAA 8* 8* 8* 8*  GFRAA 9* 9* 9* 9*    LIVER FUNCTION TESTS:  Recent Labs  08/13/15 2300 08/14/15 0608 08/15/15 0503 08/16/15 0513  BILITOT 0.3 0.6 0.8 0.4  AST 14* 20 21 9*  ALT 10* 15 26 17   ALKPHOS 96 86 81 67  PROT 7.2 6.4* 6.2* 5.7*  ALBUMIN 3.4* 3.0* 2.9* 2.6*    Assessment and Plan: Hematuria post (R)PCN exchange 2/23. Appears to be thinning today Cr remains ~5 Will check tomorrow, if continues to clear, would leave alone. If persistent gross hematuria, could bring dow for nephrostogram/exchange.  Electronically Signed: Ascencion Dike 08/17/2015, 2:41 PM   I spent a total of 15 Minutes at the the patient's bedside AND on the patient's hospital floor or unit, greater than 50% of which was counseling/coordinating care for (R) nephrostomy manangement

## 2015-08-17 NOTE — Progress Notes (Signed)
[name] ID: Amy VANDERWERF, female   DOB: 10/14/1945, 70 y.o.   MRN: [id number] [facility] PROGRESS NOTE  KRISTEE ANGUS JME:[id number] DOB: 1945/07/25 DOA: 2015-08-28 PCP: [name], MD  Brief narrative:    70 year old female with a past medical history of GERD, urolithiasis, hyperlipidemia, history of MI and atrial fibrillation during septic shock in 2013, multiple episodes of UTI with sepsis, has nephrostomy tube and had recent exchange of right nephrostomy 2022-08-27.   [name] presented to [facility] ED with reports of altered ental status and fever and blood in nephrostomy. She was started on vanco and zosyn. Her urine culture is growing pseudomonas species. Blood cultures to date are negative.   Assessment/Plan:    Principal Problem:  Sepsis secondary to Pseudomonas UTI ([place]) / Leukocytosis  - Sepsis criteria met on admission  - Now we note that the source of infection is Pseudomonas UTI - Continue Zosyn  - She is hemodynamically stable   Active Problems:  Essential hypertension, benign - On metoprolol at home - BP on soft side so we are holding metoprolol    Right percutaneous nephrostomy recent exchange 08/28/15 - Has had h/o nephrolithiasis with rt PCN initially placed on 04/11/15 - Hydronephrosis has been present despite ureteral stenting - Seen by IR, appreciate their input    Acute renal failure superimposed on stage 5 chronic kidney disease (HCC) - Recent Cr 4.86 and on this admission 5.36 - Cr 5.21 - Continue to monitor BMP   Hyponatremia  - Prerenal etiology - Sodium now better, normal, with IV fluids   Severe protein-calorie malnutrition ([place]) - In the context of chronic illness - Diet as tolerated, renal    Anemia of renal disease - Hemoglobin is 11.3, stable  DVT Prophylaxis  - SCD's bilaterally due to bleeding    Code Status: Full.  Family Communication:  plan of care discussed with the [name] Disposition Plan: home once bleeding issue with  right nephrostomy improves, likely by 3/1  IV access:  Peripheral IV  Procedures and diagnostic studies:    Dg Chest Port 1 View 2015-08-28 Mild vascular congestion noted. Mild bibasilar atelectasis seen. Electronically Signed By: Garald Balding M.D. On: Aug 28, 2015 23:16   Ir Nephrostomy Exchange Right 08-28-2015 Successful routine exchange of right-sided 10.2 French percutaneous nephrostomy tube.  Medical Consultants:  IR  IAnti-Infectives:   Vanco 2/24 --> 2/26 Zosyn 08/14/2015 -->   Leisa Lenz, MD  [facility] Pager [phone]  Time spent in minutes: 25 minutes  If 7PM-7AM, please contact night-coverage [email or web address] Password [email or web address] 08/17/2015, 12:51 PM   LOS: 4 days    HPI/Subjective: No acute overnight events. [name] reports still bleeding in nephrostomy tube.   Objective: Filed Vitals:   08/16/15 0628 08/16/15 1450 08/16/15 2109 08/17/15 0536  BP: 122/67 126/50 112/65 129/66  Pulse: 92 88 78 85  Temp: 97.4 F (36.3 C) 97.3 F (36.3 C) 98.1 F (36.7 C) 97.4 F (36.3 C)  TempSrc: Oral Oral Oral Oral  Resp: _0 Height:      Weight: 80.4 kg (177 lb 4 oz)   80.831 kg (178 lb 3.2 oz)  SpO2: 92% 98% 100% 98%    Intake/Output Summary (Last 24 hours) at 08/17/15 1251 Last data filed at 08/17/15 0550  Gross per 24 hour  Intake    700 ml  Output   1560 ml  Net   -860 ml    Exam:   General:  [name] is alert, not in acute  distress  Cardiovascular: Regular rate and rhythm, S1/S2 (+)  Respiratory: Clear to auscultation bilaterally, no wheezing, no crackles, no rhonchi  Abdomen: Soft, non tender, non distended, bowel sounds present, right nephrostomy (+)  Extremities: No edema, pulses palpable bilaterally  Neuro: Grossly nonfocal  Data Reviewed: Basic Metabolic Panel:  Recent Labs Lab 08/13/15 2300 08/14/15 0608 08/15/15 0503 08/16/15 0513 08/17/15 0453  NA 135 136 134* 138 141  K 4.2 4.3 4.4 3.8 3.9  CL 107 106 106 108 109   CO2 16* 18* 15* 18* 20*  GLUCOSE 141* 135* 96 94 93  BUN 74* 74* 66* 65* 60*  CREATININE 5.36* 5.25* 5.21* 5.02* 5.21*  CALCIUM 9.6 9.2 8.7* 8.8* 9.2   Liver Function Tests:  Recent Labs Lab 08/13/15 2300 08/14/15 0608 08/15/15 0503 08/16/15 0513  AST 14* 20 21 9*  ALT 10* _0 ALKPHOS 96 86 81 67  BILITOT 0.3 0.6 0.8 0.4  PROT 7.2 6.4* 6.2* 5.7*  ALBUMIN 3.4* 3.0* 2.9* 2.6*   No results for input(s): LIPASE, AMYLASE in the last 168 hours. No results for input(s): AMMONIA in the last 168 hours. CBC:  Recent Labs Lab 08/13/15 2300 08/14/15 0608 08/15/15 0503 08/16/15 0513 08/17/15 0453  WBC 19.1* 12.5* 12.3* 7.1 7.8  NEUTROABS  --  11.5* 10.2* 5.0  --   HGB 9.3* 11.5* 10.9* 10.3* 11.3*  HCT 29.9* 36.9 35.0* 32.5* 36.3  MCV 87.4 86.8 86.4 85.5 86.2  PLT 399 251 282 250 325   Cardiac Enzymes: No results for input(s): CKTOTAL, CKMB, CKMBINDEX, TROPONINI in the last 168 hours. BNP: Invalid input(s): POCBNP CBG:  Recent Labs Lab 08/13/15 2254  GLUCAP 129*    Recent Results (from the past 240 hour(s))  Blood Culture (routine x 2)     Status: None (Preliminary result)   Collection Time: 08/13/15 10:52 PM  Result Value Ref Range Status   Specimen Description BLOOD RIGHT HAND  Final   Special Requests BOTTLES DRAWN AEROBIC AND ANAEROBIC 5CC  Final   Culture   Final    NO GROWTH 2 DAYS Performed at [address]    Report Status PENDING  Incomplete  Blood Culture (routine x 2)     Status: None (Preliminary result)   Collection Time: 08/13/15 10:58 PM  Result Value Ref Range Status   Specimen Description BLOOD RIGHT THUMB  Final   Special Requests BOTTLES DRAWN AEROBIC AND ANAEROBIC 5CC  Final   Culture   Final    NO GROWTH 2 DAYS Performed at [facility]    Report Status PENDING  Incomplete  Urine culture     Status: None   Collection Time: 08/14/15 12:15 AM  Result Value Ref Range Status   Specimen Description URINE, CLEAN CATCH   Final   Special Requests NONE  Final   Culture   Final    >=100,000 COLONIES/mL PSEUDOMONAS AERUGINOSA Performed at [facility]    Report Status 08/16/2015 FINAL  Final   Organism ID, Bacteria PSEUDOMONAS AERUGINOSA  Final      Susceptibility   Pseudomonas aeruginosa - MIC*    CEFTAZIDIME 4 SENSITIVE Sensitive     CIPROFLOXACIN <=0.25 SENSITIVE Sensitive     GENTAMICIN <=1 SENSITIVE Sensitive     IMIPENEM 2 SENSITIVE Sensitive     PIP/TAZO 8 SENSITIVE Sensitive     CEFEPIME 2 SENSITIVE Sensitive     * >=100,000 COLONIES/mL PSEUDOMONAS AERUGINOSA  MRSA PCR Screening     Status:  None   Collection Time: 08/14/15  2:14 AM  Result Value Ref Range Status   MRSA by PCR NEGATIVE NEGATIVE Final    Comment:        The GeneXpert MRSA Assay (FDA approved for NASAL specimens only), is one component of a comprehensive MRSA colonization surveillance program. It is not intended to diagnose MRSA infection nor to guide or monitor treatment for MRSA infections.   Urine culture     Status: None   Collection Time: 08/14/15  3:07 AM  Result Value Ref Range Status   Specimen Description URINE, CLEAN CATCH  Final   Special Requests NONE  Final   Culture   Final    >=100,000 COLONIES/mL PSEUDOMONAS AERUGINOSA SUSCEPTIBILITIES PERFORMED ON PREVIOUS CULTURE WITHIN THE LAST 5 DAYS. Performed at [facility]    Report Status 08/16/2015 FINAL  Final     Scheduled Meds: . famotidine  20 mg Oral Daily  . feeding supplement (NEPRO CARB STEADY)  237 mL Oral Q24H  . latanoprost  1 drop Both Eyes QHS  . piperacillin-tazobactam (ZOSYN)  IV  2.25 g Intravenous 3 times per day  . saccharomyces boulardii  250 mg Oral BID  . sodium bicarbonate  650 mg Oral BID  . sodium chloride flush  3 mL Intravenous Q12H  . timolol  1 drop Both Eyes q morning - 10a   Continuous Infusions: . sodium chloride 50 mL/hr at 08/17/15 0550

## 2015-08-18 DIAGNOSIS — E43 Unspecified severe protein-calorie malnutrition: Secondary | ICD-10-CM

## 2015-08-18 LAB — BASIC METABOLIC PANEL
Anion gap: 13 (ref 5–15)
BUN: 49 mg/dL — ABNORMAL HIGH (ref 6–20)
CO2: 18 mmol/L — ABNORMAL LOW (ref 22–32)
Calcium: 9.1 mg/dL (ref 8.9–10.3)
Chloride: 109 mmol/L (ref 101–111)
Creatinine, Ser: 4.83 mg/dL — ABNORMAL HIGH (ref 0.44–1.00)
GFR calc Af Amer: 10 mL/min — ABNORMAL LOW (ref >60)
GFR calc non Af Amer: 8 mL/min — ABNORMAL LOW (ref >60)
Glucose, Bld: 114 mg/dL — ABNORMAL HIGH (ref 65–99)
Potassium: 4.2 mmol/L (ref 3.5–5.1)
Sodium: 140 mmol/L (ref 135–145)

## 2015-08-18 LAB — CBC
HCT: 37.3 % (ref 36.0–46.0)
Hemoglobin: 12 g/dL (ref 12.0–15.0)
MCH: 27.4 pg (ref 26.0–34.0)
MCHC: 32.2 g/dL (ref 30.0–36.0)
MCV: 85.2 fL (ref 78.0–100.0)
Platelets: 274 10*3/uL (ref 150–400)
RBC: 4.38 MIL/uL (ref 3.87–5.11)
RDW: 16.4 % — ABNORMAL HIGH (ref 11.5–15.5)
WBC: 9 10*3/uL (ref 4.0–10.5)

## 2015-08-18 NOTE — Care Management Important Message (Signed)
Important Message  Patient Details  Name: MIKAYLIN ARZATE MRN: [id number] Date of Birth: 10-14-1945   Medicare Important Message Given:  Yes    Camillo Flaming 08/18/2015, 1:05 [place] Message  Patient Details  Name: DARLENY WILMINGTON MRN: [id number] Date of Birth: [birth year]   Medicare Important Message Given:  Yes    Camillo Flaming 08/18/2015, 1:05 PM

## 2015-08-18 NOTE — Progress Notes (Signed)
Pharmacy Antibiotic Note  [name] is a 70 y.o. female with hx multiple episodes of UTI with sepsis following nephrostomy tube change admitted on 2/23 with AMS, tachycardia, and fever following nephrostomy tube change.  Pharmacy consulted to start Vancomycin and Zosyn.   2/28: Day #5 antibiotics.  ESRD - CrCl remains  ~10 ml/min, WBC improved to WNL.  Plan:  Continue  Zosyn 2.25g IV q8h.   F/u renal fxn, cultures, levels, clinical course  Recommend narrowing to Cipro (either 400mg  IV or 500mg  PO) q24 hours.   Height: 5\' 3"  (160 cm) Weight: 176 lb 12.9 oz (80.2 kg) IBW/kg (Calculated) : 52.4  Temp (24hrs), Avg:97.4 F (36.3 C), Min:97.3 F (36.3 C), Max:97.5 F (36.4 C)   Recent Labs Lab 08/13/15 2300 08/13/15 2316 08/14/15 0608 08/15/15 0503 08/15/15 0720 08/16/15 0513 08/17/15 0453  WBC 19.1*  --  12.5* 12.3*  --  7.1 7.8  CREATININE 5.36*  --  5.25* 5.21*  --  5.02* 5.21*  LATICACIDVEN  --  1.31  --   --   --   --   --   VANCORANDOM  --   --   --   --  11  --   --     Estimated Creatinine Clearance: 10.1 mL/min (by C-G formula based on Cr of 5.21).    Allergies  Allergen Reactions  . Vicodin [Hydrocodone-Acetaminophen] Nausea And Vomiting  . Chlorhexidine Rash    Sunburn    rash  . Percocet [Oxycodone-Acetaminophen] Nausea And Vomiting    Antimicrobials this admission: PTA ceftin 250mg  daily PRN (1 tab day before, day of, day after procedure -for nephrostomy tube changes?) 2/24 vancomycin >> 2/25 2/23 zosyn >>   Dose adjustments this admission: Zosyn 2.25g q6h --> 2.25g q8h 2/25 0720 random vanco = 61mcg/ml (vanco 1gm given 2/24 at 00:12)  Microbiology results:  12/2 Urine Cx: MRSA (R-cipro,clinda, oxacillin, S-vanc MIC 1, bactrim, rifampin, inducible clinda, gent) 2/23 BCx: ngtd 2/24 UCx: >100k Pseudomonas (pan-sent) 2/24 Ucx (bag/?nephrostomy): >100k Pseudomonas (pan-sens) 2/24 MRSA PCR: negative  Thank you for allowing pharmacy to be a  part of this patient's care.  Gretta Arab PharmD, BCPS Pager [phone] 08/18/2015 8:12 AM

## 2015-08-18 NOTE — Clinical Documentation Improvement (Signed)
Internal Medicine  Can the diagnosis of CKD be further specified? Conflicting documentation seen in chart. ESRD documented by Pharmacy, CKD 3 documented in ED and H&P.  Please document findings in next progress note NOT in BPA drop down box. Thank you!   CKD Stage I - GFR greater than or equal to 90  CKD Stage II - GFR 60-89  CKD Stage III - GFR 30-59  CKD Stage IV - GFR 15-29  CKD Stage V - GFR < 15  ESRD (End Stage Renal Disease)  Other condition  Unable to clinically determine  Supporting Information: : (risk factors, signs and symptoms, diagnostics, treatment)  White female  GFR's running 7-8 for current admission  Please exercise your independent, professional judgment when responding. A specific answer is not anticipated or expected.  Thank You, Zoila Shutter RN, BSN, [place] [phone]; Cell: [phone]

## 2015-08-18 NOTE — Progress Notes (Signed)
Patient ID: [name], female   DOB: 20-Sep-1945, 70 y.o.   MRN: [id number]    Referring Physician(s): [place]  Chief Complaint: Right hydronephrosis  Subjective: Pt feels well this morning.  States urine is clearing up.  It's the first day that it has not been bright red.  It is clearing up in the drainage tube per the patient and only looked like "[place] Punch" when she emptied her bag  Allergies: Vicodin; Chlorhexidine; and Percocet  Medications: Prior to Admission medications   Medication Sig Start Date End Date Taking? Authorizing Provider  acetaminophen (TYLENOL) 500 MG tablet Take 500 mg by mouth every 6 (six) hours as needed for moderate pain or headache.    Yes Historical Provider, MD  bimatoprost (LUMIGAN) 0.01 % SOLN Place 1 drop into both eyes at bedtime.   Yes Historical Provider, MD  cefUROXime (CEFTIN) 250 MG tablet Take 250 mg by mouth daily as needed (prevent infection). Medication Instructions: Take 1 tablet the day before, Take 1 tablet the day of and Take 1 tablet the following day of procedure. 07/21/15  Yes Historical Provider, MD  metoprolol tartrate (LOPRESSOR) 25 MG tablet Take 12.5 mg by mouth 2 (two) times daily.  07/22/15  Yes Historical Provider, MD  ranitidine (ZANTAC) 150 MG tablet Take 150 mg by mouth 2 (two) times daily as needed for heartburn.   Yes Historical Provider, MD  saccharomyces boulardii (FLORASTOR) 250 MG capsule Take 1 capsule (250 mg total) by mouth 2 (two) times daily. 07/08/15  Yes Donne Hazel, MD  sodium bicarbonate 650 MG tablet Take 1 tablet (650 mg total) by mouth 2 (two) times daily. 05/30/15  Yes Robbie Lis, MD  timolol (BETIMOL) 0.5 % ophthalmic solution Place 1 drop into both eyes every morning.    Yes Historical Provider, MD  acidophilus (RISAQUAD) CAPS capsule Take 2 capsules by mouth daily. Patient not taking: Reported on 08/13/2015 05/07/15   Cristal Ford, DO  Nutritional Supplements (FEEDING SUPPLEMENT,  NEPRO CARB STEADY,) LIQD Take 237 mLs by mouth daily. 05/07/15   Maryann Mikhail, DO  sodium chloride (OCEAN) 0.65 % SOLN nasal spray Place 1 spray into both nostrils 3 (three) times daily as needed for congestion.    Historical Provider, MD    Vital Signs: BP 112/61 mmHg  Pulse 78  Temp(Src) 97.3 F (36.3 C) (Oral)  Resp 18  Ht 5\' 3"  (1.6 m)  Wt 176 lb 12.9 oz (80.2 kg)  BMI 31.33 kg/m2  SpO2 99%  LMP 01/19/1992  Physical Exam: Abd: soft, NT, drain site in back is clean and dry.  No retraction of the drainage catheter.  Urine emptied into container was serosang in nature.  No frank bleeding or blood clots present  Imaging: No results found.  Labs:  CBC:  Recent Labs  08/15/15 0503 08/16/15 0513 08/17/15 0453 08/18/15 0831  WBC 12.3* 7.1 7.8 9.0  HGB 10.9* 10.3* 11.3* 12.0  HCT 35.0* 32.5* 36.3 37.3  PLT 282 250 325 274    COAGS:  Recent Labs  02/26/15 0015 05/04/15 0435 05/23/15 1450 07/04/15 0340  INR 1.17 1.24 1.30 1.20  APTT 33  --   --  32    BMP:  Recent Labs  08/15/15 0503 08/16/15 0513 08/17/15 0453 08/18/15 0831  NA 134* 138 141 140  K 4.4 3.8 3.9 4.2  CL 106 108 109 109  CO2 15* 18* 20* 18*  GLUCOSE 96 94 93 114*  BUN 66* 65*  60* 49*  CALCIUM 8.7* 8.8* 9.2 9.1  CREATININE 5.21* 5.02* 5.21* 4.83*  GFRNONAA 8* 8* 8* 8*  GFRAA 9* 9* 9* 10*    LIVER FUNCTION TESTS:  Recent Labs  08/13/15 2300 08/14/15 0608 08/15/15 0503 08/16/15 0513  BILITOT 0.3 0.6 0.8 0.4  AST 14* 20 21 9*  ALT 10* 15 26 17   ALKPHOS 96 86 81 67  PROT 7.2 6.4* 6.2* 5.7*  ALBUMIN 3.4* 3.0* 2.9* 2.6*    Assessment and Plan: Hematuria post  (R) PCN exchange 2/23 -conts to thin out -will d/w Dr. Pascal Lux to determine whether he feels like she needs a nephrostogram/exchange or whether we can leave this along and keep following conservatively. -creatinine down to 4.83.  Electronically Signed: Henreitta Cea 08/18/2015, 10:04 AM   I spent a total of 15  Minutes at the the patient's bedside AND on the patient's hospital floor or unit, greater than 50% of which was counseling/coordinating care for right hydronephrosis, hematuria s/p exchange of PCN

## 2015-08-18 NOTE — Clinical Documentation Improvement (Signed)
Internal Medicine  A cause and effect relationship may not be assumed and must be documented by a provider.  Please clarify the relationship, if any, between Sepsis with UTI and Nephrostomy Tube.  Please document findings in next progress note NOT in BPA drop down box. Thank you!  Are the conditions:   Due to or associated with each other  Unrelated to each other  Other  Clinically Undetermined  Supporting Information (risk factors, sign and symptoms, diagnostics, treatment):  Please exercise your independent, professional judgment when responding. A specific answer is not anticipated or expected.  Thank You,  Zoila Shutter RN, BSN, [place] [phone]; Cell: [phone]

## 2015-08-18 NOTE — Progress Notes (Signed)
Patient ID: [name], female   DOB: [birth year], 70 y.o.   MRN: [id number] [facility] PROGRESS NOTE  NIEMAH SCHWEBKE BLT:[id number] DOB: 29-Oct-1945 DOA: 09-05-2015 PCP: [name], MD  Brief narrative:    70 year old female with a past medical history of GERD, urolithiasis, hyperlipidemia, history of MI and atrial fibrillation during septic shock in 2013, multiple episodes of UTI with sepsis, has nephrostomy tube and had recent exchange of right nephrostomy 09-04-22.   Pt presented to [facility] ED with reports of altered ental status and fever and blood in nephrostomy. She was started on vanco and zosyn. Her urine culture is growing pseudomonas species. Blood cultures to date are negative.   Assessment/Plan:    Principal Problem:  Sepsis secondary to Pseudomonas UTI ([place]) / Leukocytosis  - Sepsis criteria met on admission. Source of infection is Pseudomonas UTI - Continue Zosyn in hospital and switch to cipro on discharge   Active Problems:  Essential hypertension, benign - BP stable off of BP metoprolol    Right percutaneous nephrostomy recent exchange 2015/09/05 - Has had h/o nephrolithiasis with rt PCN initially placed on 04/11/15 - Hydronephrosis has been present despite ureteral stenting - Appreciate IR following     Acute renal failure superimposed on stage 5 chronic kidney disease (HCC) - Recent Cr 4.86 and on this admission 5.36 - Cr improving, 4.83 this am   Hyponatremia  - Likely due to prerenal etiology  - Sodium normalized with fluids    Severe protein-calorie malnutrition ([place]) - In the context of chronic illness - Tolerates regular diet    Anemia of renal disease - Hemoglobin is 12 this am   DVT Prophylaxis  - SCD's bilaterally due to risk of bleeding    Code Status: Full.  Family Communication:  plan of care discussed with the patient Disposition Plan: home once bleeding issue with right nephrostomy improves, likely by 3/1  IV access:   Peripheral IV  Procedures and diagnostic studies:    Dg Chest Port 1 View 2015/09/05 Mild vascular congestion noted. Mild bibasilar atelectasis seen. Electronically Signed By: Garald Balding M.D. On: 09/05/2015 23:16   Ir Nephrostomy Exchange Right 05-Sep-2015 Successful routine exchange of right-sided 10.2 French percutaneous nephrostomy tube.  Medical Consultants:  IR  IAnti-Infectives:   Vanco 2/24 --> 2/26 Zosyn 08/14/2015 -->   Leisa Lenz, MD  [facility] Pager [phone]  Time spent in minutes: 25 minutes  If 7PM-7AM, please contact night-coverage [email or web address] Password [email or web address] 08/18/2015, 12:07 PM   LOS: 5 days    HPI/Subjective: No acute overnight events. Less bleeding but still blood noted in nephrostomy.   Objective: Filed Vitals:   08/17/15 0536 08/17/15 1309 08/17/15 2117 08/18/15 0457  BP: 129/66 119/57 128/74 112/61  Pulse: 85 84 80 78  Temp: 97.4 F (36.3 C) 97.4 F (36.3 C) 97.5 F (36.4 C) 97.3 F (36.3 C)  TempSrc: Oral Oral Oral Oral  Resp: '18 20 18 18  '$ Height:      Weight: 80.831 kg (178 lb 3.2 oz)   80.2 kg (176 lb 12.9 oz)  SpO2: 98% 99% 100% 99%    Intake/Output Summary (Last 24 hours) at 08/18/15 1207 Last data filed at 08/18/15 0900  Gross per 24 hour  Intake 5444.17 ml  Output   1800 ml  Net 3644.17 ml    Exam:   General:  Pt is not in acute distress  Cardiovascular: RRR, S1/S2 appreciated   Respiratory: No wheezing, no crackles, no rhonchi  Abdomen:  Non tender abd, right nephrostomy (+)  Extremities: No swelling, palpable pulses   Neuro: Nonfocal  Data Reviewed: Basic Metabolic Panel:  Recent Labs Lab 08/14/15 0608 08/15/15 0503 08/16/15 0513 08/17/15 0453 08/18/15 0831  NA 136 134* 138 141 140  K 4.3 4.4 3.8 3.9 4.2  CL 106 106 108 109 109  CO2 18* 15* 18* 20* 18*  GLUCOSE 135* 96 94 93 114*  BUN 74* 66* 65* 60* 49*  CREATININE 5.25* 5.21* 5.02* 5.21* 4.83*  CALCIUM 9.2 8.7* 8.8* 9.2 9.1    Liver Function Tests:  Recent Labs Lab 08/13/15 2300 08/14/15 0608 08/15/15 0503 08/16/15 0513  AST 14* 20 21 9*  ALT 10* '15 26 17  '$ ALKPHOS 96 86 81 67  BILITOT 0.3 0.6 0.8 0.4  PROT 7.2 6.4* 6.2* 5.7*  ALBUMIN 3.4* 3.0* 2.9* 2.6*   No results for input(s): LIPASE, AMYLASE in the last 168 hours. No results for input(s): AMMONIA in the last 168 hours. CBC:  Recent Labs Lab 08/14/15 0608 08/15/15 0503 08/16/15 0513 08/17/15 0453 08/18/15 0831  WBC 12.5* 12.3* 7.1 7.8 9.0  NEUTROABS 11.5* 10.2* 5.0  --   --   HGB 11.5* 10.9* 10.3* 11.3* 12.0  HCT 36.9 35.0* 32.5* 36.3 37.3  MCV 86.8 86.4 85.5 86.2 85.2  PLT 251 282 250 325 274   Cardiac Enzymes: No results for input(s): CKTOTAL, CKMB, CKMBINDEX, TROPONINI in the last 168 hours. BNP: Invalid input(s): POCBNP CBG:  Recent Labs Lab 08/13/15 2254  GLUCAP 129*    Recent Results (from the past 240 hour(s))  Blood Culture (routine x 2)     Status: None (Preliminary result)   Collection Time: 08/13/15 10:52 PM  Result Value Ref Range Status   Specimen Description BLOOD RIGHT HAND  Final   Special Requests BOTTLES DRAWN AEROBIC AND ANAEROBIC 5CC  Final   Culture   Final    NO GROWTH 3 DAYS Performed at [facility]    Report Status PENDING  Incomplete  Blood Culture (routine x 2)     Status: None (Preliminary result)   Collection Time: 08/13/15 10:58 PM  Result Value Ref Range Status   Specimen Description BLOOD RIGHT THUMB  Final   Special Requests BOTTLES DRAWN AEROBIC AND ANAEROBIC 5CC  Final   Culture   Final    NO GROWTH 3 DAYS Performed at [facility]    Report Status PENDING  Incomplete  Urine culture     Status: None   Collection Time: 08/14/15 12:15 AM  Result Value Ref Range Status   Specimen Description URINE, CLEAN CATCH  Final   Special Requests NONE  Final   Culture   Final    >=100,000 COLONIES/mL PSEUDOMONAS AERUGINOSA Performed at [facility]    Report Status  08/16/2015 FINAL  Final   Organism ID, Bacteria PSEUDOMONAS AERUGINOSA  Final      Susceptibility   Pseudomonas aeruginosa - MIC*    CEFTAZIDIME 4 SENSITIVE Sensitive     CIPROFLOXACIN <=0.25 SENSITIVE Sensitive     GENTAMICIN <=1 SENSITIVE Sensitive     IMIPENEM 2 SENSITIVE Sensitive     PIP/TAZO 8 SENSITIVE Sensitive     CEFEPIME 2 SENSITIVE Sensitive     * >=100,000 COLONIES/mL PSEUDOMONAS AERUGINOSA  MRSA PCR Screening     Status: None   Collection Time: 08/14/15  2:14 AM  Result Value Ref Range Status   MRSA by PCR NEGATIVE NEGATIVE Final    Comment:  The GeneXpert MRSA Assay (FDA approved for NASAL specimens only), is one component of a comprehensive MRSA colonization surveillance program. It is not intended to diagnose MRSA infection nor to guide or monitor treatment for MRSA infections.   Urine culture     Status: None   Collection Time: 08/14/15  3:07 AM  Result Value Ref Range Status   Specimen Description URINE, CLEAN CATCH  Final   Special Requests NONE  Final   Culture   Final    >=100,000 COLONIES/mL PSEUDOMONAS AERUGINOSA SUSCEPTIBILITIES PERFORMED ON PREVIOUS CULTURE WITHIN THE LAST 5 DAYS. Performed at [facility]    Report Status 08/16/2015 FINAL  Final     Scheduled Meds: . famotidine  20 mg Oral Daily  . feeding supplement (NEPRO CARB STEADY)  237 mL Oral Q24H  . latanoprost  1 drop Both Eyes QHS  . piperacillin-tazobactam (ZOSYN)  IV  2.25 g Intravenous 3 times per day  . saccharomyces boulardii  250 mg Oral BID  . sodium bicarbonate  650 mg Oral BID  . sodium chloride flush  3 mL Intravenous Q12H  . timolol  1 drop Both Eyes q morning - 10a   Continuous Infusions: . sodium chloride 50 mL/hr at 08/18/15 [phone]

## 2015-08-19 LAB — CULTURE, BLOOD (ROUTINE X 2)
Culture: NO GROWTH
Culture: NO GROWTH

## 2015-08-19 MED ORDER — CIPROFLOXACIN HCL 500 MG PO TABS: 500.0000 mg | ORAL_TABLET | Freq: Two times a day (BID) | ORAL | Status: DC

## 2015-08-19 NOTE — Discharge Instructions (Signed)
Urinary Tract Infection Urinary tract infections (UTIs) can develop anywhere along your urinary tract. Your urinary tract is your body's drainage system for removing wastes and extra water. Your urinary tract includes two kidneys, two ureters, a bladder, and a urethra. Your kidneys are a pair of bean-shaped organs. Each kidney is about the size of your fist. They are located below your ribs, one on each side of your spine. CAUSES Infections are caused by microbes, which are microscopic organisms, including fungi, viruses, and bacteria. These organisms are so small that they can only be seen through a microscope. Bacteria are the microbes that most commonly cause UTIs. SYMPTOMS  Symptoms of UTIs may vary by age and gender of the patient and by the location of the infection. Symptoms in young women typically include a frequent and intense urge to urinate and a painful, burning feeling in the bladder or urethra during urination. Older women and men are more likely to be tired, shaky, and weak and have muscle aches and abdominal pain. A fever may mean the infection is in your kidneys. Other symptoms of a kidney infection include pain in your back or sides below the ribs, nausea, and vomiting. DIAGNOSIS To diagnose a UTI, your caregiver will ask you about your symptoms. Your caregiver will also ask you to provide a urine sample. The urine sample will be tested for bacteria and white blood cells. White blood cells are made by your body to help fight infection. TREATMENT  Typically, UTIs can be treated with medication. Because most UTIs are caused by a bacterial infection, they usually can be treated with the use of antibiotics. The choice of antibiotic and length of treatment depend on your symptoms and the type of bacteria causing your infection. HOME CARE INSTRUCTIONS  If you were prescribed antibiotics, take them exactly as your caregiver instructs you. Finish the medication even if you feel better after  you have only taken some of the medication.  Drink enough water and fluids to keep your urine clear or pale yellow.  Avoid caffeine, tea, and carbonated beverages. They tend to irritate your bladder.  Empty your bladder often. Avoid holding urine for long periods of time.  Empty your bladder before and after sexual intercourse.  After a bowel movement, women should cleanse from front to back. Use each tissue only once. SEEK MEDICAL CARE IF:   You have back pain.  You develop a fever.  Your symptoms do not begin to resolve within 3 days. SEEK IMMEDIATE MEDICAL CARE IF:   You have severe back pain or lower abdominal pain.  You develop chills.  You have nausea or vomiting.  You have continued burning or discomfort with urination. MAKE SURE YOU:   Understand these instructions.  Will watch your condition.  Will get help right away if you are not doing well or get worse.   This information is not intended to replace advice given to you by your health care provider. Make sure you discuss any questions you have with your health care provider.   Document Released: 03/16/2005 Document Revised: 02/25/2015 Document Reviewed: 07/15/2011 Elsevier Interactive Patient Education 2016 [employer] Ciprofloxacin extended-release tablets What is this medicine? CIPROFLOXACIN (sip roe FLOX a sin) is a quinolone antibiotic. It is used to treat certain kinds of bacterial infections. It will not work for colds, flu, or other viral infections. This medicine may be used for other purposes; ask your health care provider or pharmacist if you have questions. What should I tell  my health care provider before I take this medicine? They need to know if you have any of these conditions: -bone problems -cerebral disease -history of low potassium levels in the blood -irregular heartbeat -joint problems -kidney disease -myasthenia gravis -seizures -tendon problems -tingling of the fingers or  toes, or other nerve disorder -an unusual or allergic reaction to ciprofloxacin, other antibiotics or medicines, foods, dyes, or preservatives -pregnant or trying to get pregnant -breast-feeding How should I use this medicine? Take this medicine by mouth with a full glass of water. Follow the directions on the prescription label. Do not split, crush, or chew the tablet. Take your medicine at regular intervals. Do not take your medicine more often than directed. Take all of your medicine as directed even if you think your are better. Do not skip doses or stop your medicine early. You can take this medicine with food or on an empty stomach. It can be taken with a meal that contains dairy or calcium, but do not take it alone with a dairy product, like milk or yogurt, or calcium-fortified juice. A special MedGuide will be given to you by the pharmacist with each prescription and refill. Be sure to read this information carefully each time. Talk to your pediatrician regarding the use of this medicine in children. Special care may be needed. Overdosage: If you think you have taken too much of this medicine contact a poison control center or emergency room at once. NOTE: This medicine is only for you. Do not share this medicine with others. What if I miss a dose? If you miss a dose, take it as soon as you can. If it is almost time for your next dose, take only that dose. Do not take double or extra doses. Do not take more than one dose in a day. What may interact with this medicine? Do not take this medicine with any of the following medications: -cisapride -droperidol -terfenadine -tizanidine This medicine may also interact with the following medications: -antacids -birth control pills -caffeine -cyclosporin -didanosine (ddI) buffered tablets or powder -medicines for diabetes -medicines for inflammation like ibuprofen,  naproxen -methotrexate -multivitamins -omeprazole -phenytoin -probenecid -sucralfate -theophylline -warfarin This list may not describe all possible interactions. Give your health care provider a list of all the medicines, herbs, non-prescription drugs, or dietary supplements you use. Also tell them if you smoke, drink alcohol, or use illegal drugs. Some items may interact with your medicine. What should I watch for while using this medicine? Tell your doctor or health care professional if your symptoms do not improve. Do not treat diarrhea with over the counter products. Contact your doctor if you have diarrhea that lasts more than 2 days or if it is severe and watery. You may get drowsy or dizzy. Do not drive, use machinery, or do anything that needs mental alertness until you know how this medicine affects you. Do not stand or sit up quickly, especially if you are an older patient. This reduces the risk of dizzy or fainting spells. This medicine can make you more sensitive to the sun. Keep out of the sun. If you cannot avoid being in the sun, wear protective clothing and use sunscreen. Do not use sun lamps or tanning beds/booths. Avoid antacids, aluminum, calcium, iron, magnesium, and zinc products for 6 hours before and 2 hours after taking a dose of this medicine. What side effects may I notice from receiving this medicine? Side effects that you should report to your doctor or health  care professional as soon as possible: -allergic reactions like skin rash or hives, swelling of the face, lips, or tongue -anxious -confusion -depressed mood -diarrhea -fast, irregular heartbeat -hallucination, loss of contact with reality -joint, muscle, or tendon pain or swelling -pain, tingling, numbness in the hands or feet -suicidal thoughts or other mood changes -sunburn -unusually weak or tired Side effects that usually do not require medical attention (report to your doctor or health care  professional if they continue or are bothersome): -dry mouth -headache -nausea -trouble sleeping This list may not describe all possible side effects. Call your doctor for medical advice about side effects. You may report side effects to FDA at 1-800-FDA-1088. Where should I keep my medicine? Keep out of the reach of children. Store at room temperature between 15 to 30 degrees C (59 to 86 degrees F). Keep container tightly closed. Throw away any unused medicine after the expiration date. NOTE: This sheet is a summary. It may not cover all possible information. If you have questions about this medicine, talk to your doctor, pharmacist, or health care provider.    2016, Elsevier/Gold Standard. (2015-01-15 12:49:29)

## 2015-08-19 NOTE — Discharge Summary (Signed)
Physician Discharge Summary  [name] ZOX:[id number] DOB: 1946-02-28 DOA: 08/13/2015  PCP: [name], MD  Admit date: 08/13/2015 Discharge date: 08/19/2015  Recommendations for Outpatient Follow-up:  1. Continue Cipro 500 mg twice daily for 7 days on discharge for Pseudomonas UTI.  Discharge Diagnoses:  Principal Problem:   Sepsis secondary to UTI [facility]) Active Problems:   Glaucoma   Essential hypertension, benign   Normocytic anemia   Acute renal failure superimposed on stage 4 chronic kidney disease (HCC)   End stage renal disease (HCC)   Severe protein-calorie malnutrition (HCC)   Anemia of renal disease    Discharge Condition: stable   Diet recommendation: as tolerated   History of present illness:  70 year old female with a past medical history of GERD, urolithiasis, hyperlipidemia, history of MI and atrial fibrillation during septic shock in 2013, multiple episodes of UTI with sepsis, has nephrostomy tube and had recent exchange of right nephrostomy 2/23.   Pt presented to [facility] ED with reports of altered ental status and fever and blood in nephrostomy. She was started on vanco and zosyn. Her urine culture is growing pseudomonas species. Blood cultures to date are negative.   Hospital Course:  Assessment/Plan:    Principal Problem:  Sepsis secondary to Pseudomonas UTI ([place]) / Leukocytosis  - Sepsis criteria met on admission. Source of infection is Pseudomonas UTI - She received vanco and zosyn, please refer to anti-infectives section. - Cipro for 7 days on discharge  Active Problems:  Essential hypertension, benign - BP stable off of BP metoprolol    Right percutaneous nephrostomy recent exchange 08/13/15 - Has had h/o nephrolithiasis with rt PCN initially placed on 04/11/15 - Hydronephrosis has been present despite ureteral stenting - Appreciate IR following    Acute renal failure superimposed on stage 5 chronic kidney disease (HCC) - Recent  Cr 4.86 and on this admission 5.36 - Cr improving, 4.83 on 08/18/2015   Hyponatremia  - Likely due to prerenal etiology  - Sodium normalized with fluids    Severe protein-calorie malnutrition (HCC) - In the context of chronic illness - Tolerated regular diet    Anemia of renal disease - Hemoglobin 12, stable   DVT Prophylaxis  - SCD's bilaterally   Code Status: Full.  Family Communication: plan of care discussed with the patient and her daughter at the bedside    IV access:  Peripheral IV  Procedures and diagnostic studies:   Dg Chest Port 1 View 08/13/2015 Mild vascular congestion noted. Mild bibasilar atelectasis seen. Electronically Signed By: Garald Balding M.D. On: 08/13/2015 23:16   Ir Nephrostomy Exchange Right 08/13/2015 Successful routine exchange of right-sided 10.2 French percutaneous nephrostomy tube.  Medical Consultants:  IR IAnti-Infectives:   Vanco 2/24 --> 2/26 Zosyn 08/14/2015 --> 08/19/2015     Signed:  Leisa Lenz, MD  [facility] 08/19/2015, 10:02 AM  Pager #: [phone]  Time spent in minutes: more than 30 minutes    Discharge Exam: Filed Vitals:   08/18/15 2128 08/19/15 0655  BP: 147/71 128/93  Pulse: 78 78  Temp: 97.6 F (36.4 C) 97.4 F (36.3 C)  Resp: 20 20   Filed Vitals:   08/18/15 1315 08/18/15 2128 08/19/15 0500 08/19/15 0655  BP: 140/69 147/71  128/93  Pulse: 76 78  78  Temp:  97.6 F (36.4 C)  97.4 F (36.3 C)  TempSrc:  Oral  Oral  Resp: _0 Height:      Weight:   81.466  kg (179 lb 9.6 oz)   SpO2: 97% 96%  96%    General: Pt is alert, follows commands appropriately, not in acute distress Cardiovascular: Regular rate and rhythm, S1/S2 +, no murmurs Respiratory: Clear to auscultation bilaterally, no wheezing, no crackles, no rhonchi Abdominal: Soft, non tender, non distended, bowel sounds +, no guarding Extremities: no edema, no cyanosis, pulses palpable bilaterally DP and  PT Neuro: Grossly nonfocal  Discharge Instructions  Discharge Instructions    Call MD for:  difficulty breathing, headache or visual disturbances    Complete by:  As directed      Call MD for:  persistant dizziness or light-headedness    Complete by:  As directed      Call MD for:  persistant nausea and vomiting    Complete by:  As directed      Call MD for:  severe uncontrolled pain    Complete by:  As directed      Diet - low sodium heart healthy    Complete by:  As directed      Discharge instructions    Complete by:  As directed   1. Continue Cipro 500 mg twice daily for 7 days on discharge for Pseudomonas UTI.     Increase activity slowly    Complete by:  As directed             Medication List    STOP taking these medications        acidophilus Caps capsule     cefUROXime 250 MG tablet  Commonly known as:  CEFTIN      TAKE these medications        acetaminophen 500 MG tablet  Commonly known as:  TYLENOL  Take 500 mg by mouth every 6 (six) hours as needed for moderate pain or headache.     bimatoprost 0.01 % Soln  Commonly known as:  LUMIGAN  Place 1 drop into both eyes at bedtime.     ciprofloxacin 500 MG tablet  Commonly known as:  CIPRO  Take 1 tablet (500 mg total) by mouth 2 (two) times daily.     feeding supplement (NEPRO CARB STEADY) Liqd  Take 237 mLs by mouth daily.     metoprolol tartrate 25 MG tablet  Commonly known as:  LOPRESSOR  Take 12.5 mg by mouth 2 (two) times daily.     ranitidine 150 MG tablet  Commonly known as:  ZANTAC  Take 150 mg by mouth 2 (two) times daily as needed for heartburn.     saccharomyces boulardii 250 MG capsule  Commonly known as:  FLORASTOR  Take 1 capsule (250 mg total) by mouth 2 (two) times daily.     sodium bicarbonate 650 MG tablet  Take 1 tablet (650 mg total) by mouth 2 (two) times daily.     sodium chloride 0.65 % Soln nasal spray  Commonly known as:  OCEAN  Place 1 spray into both nostrils 3  (three) times daily as needed for congestion.     timolol 0.5 % ophthalmic solution  Commonly known as:  BETIMOL  Place 1 drop into both eyes every morning.           Follow-up Information    Follow up with [name], MD. Schedule an appointment as soon as possible for a visit in 1 week.   Specialty:  Internal Medicine   Why:  Follow up appt after recent hospitalization   Contact information:   [address]  [place] [address] [phone]        The results of significant diagnostics from this hospitalization (including imaging, microbiology, ancillary and laboratory) are listed below for reference.    Significant Diagnostic Studies: Nm Renal Imaging Flow W/pharm  07/29/2015  CLINICAL DATA:  Evaluate bilateral hydronephrosis EXAM: NUCLEAR MEDICINE RENAL SCAN WITH DIURETIC ADMINISTRATION TECHNIQUE: Radionuclide angiographic and sequential renal images were obtained after intravenous injection of radiopharmaceutical. Imaging was continued during slow intravenous injection of Lasix approximately 15 minutes after the start of the examination. RADIOPHARMACEUTICALS:  14.6 Technetium-45mMAG3 IV COMPARISON:  None. FINDINGS: Flow: Diminished and the delayed perfusion to the left kidney identified. Relative normal profusion to the right kidney noted. The appearance is similar to 04/08/2015. Left renogram: There is delayed cortical uptake, excretion and clearance of the radiopharmaceutical by the smaller appearing left kidney. Right renogram: There is normal cortical uptake, excretion and clearance of the radiopharmaceutical by the normal sized right kidney. Differential: Left kidney = 31 % Right kidney = 69 % T1/2 post Lasix : Left kidney = not achieved min Right kidney = not achieved min IMPRESSION: 1. The split renal function is equal to 31% from the left kidney and 69% from the right kidney. Previously 28% and 72%. 2. Delayed and diminished perfusion, cortical uptake  and excretion of the radiopharmaceutical by the left kidney. 3. Normal perfusion, and cortical uptake of the radiopharmaceutical by the right kidney. There is also normal excretion and clearance of the radiopharmaceutical. 4. No evidence for high-grade obstructive uropathy. Electronically Signed   By: TKerby MoorsM.D.   On: 07/29/2015 15:44   Dg Chest Port 1 View  08/13/2015  CLINICAL DATA:  Acute onset of fever. Recent nephrostomy tube exchange. Initial encounter. EXAM: PORTABLE CHEST 1 VIEW COMPARISON:  Chest radiograph performed 07/04/2015 FINDINGS: A right-sided dual-lumen catheter is noted ending about the mid SVC. Mild vascular congestion is noted. Minimal bibasilar atelectasis is seen. No pleural effusion or pneumothorax is identified. The cardiomediastinal silhouette is borderline normal in size. No acute osseous abnormalities are identified. IMPRESSION: Mild vascular congestion noted.  Mild bibasilar atelectasis seen. Electronically Signed   By: JGarald BaldingM.D.   On: 08/13/2015 23:16   Ir Nephrostomy Exchange Right  08/13/2015  INDICATION: 70year old female with a history of persistent right-sided hydronephrosis despite ureteral stent placement. A percutaneous nephrostomy tube was placed on 04/11/2015. Patient presents today for routine exchange. EXAM: IR EXCHANGE NEPHROSTOMY RIGHT COMPARISON:  Prior nephrostomy exchange 07/03/15 MEDICATIONS: None ANESTHESIA/SEDATION: None required CONTRAST:  10 mL Omnipaque 350 - administered into the collecting system(s) FLUOROSCOPY TIME:  Fluoroscopy Time: 0 minutes 6 seconds COMPLICATIONS: None immediate. PROCEDURE: Informed written consent was obtained from the patient after a thorough discussion of the procedural risks, benefits and alternatives. All questions were addressed. Maximal Sterile Barrier Technique was utilized including caps, mask, sterile gowns, sterile gloves, sterile drape, hand hygiene and skin antiseptic. A timeout was performed prior to  the initiation of the procedure. Of the right-sided percutaneous nephrostomy tube was injected by hand under fluoroscopy. The tube is well positioned within the renal pelvis. There is no hydronephrosis. Double-J ureteral stent is present. The nephrostomy tube was cut and removed over a wire. A new 10 French percutaneous all-purpose drainage catheter was advanced over the wire and formed in the renal pelvis. The tube was secured to the skin with an adhesive fixation device. A gentle hand injection of contrast material confirmed its location within the renal pelvis. The patient tolerated  the procedure well. IMPRESSION: Successful routine exchange of right-sided 10.2 French percutaneous nephrostomy tube. Signed, Criselda Peaches, MD [facility] Radiology Electronically Signed   By: Jacqulynn Cadet M.D.   On: 08/13/2015 15:21    Microbiology: Recent Results (from the past 240 hour(s))  Blood Culture (routine x 2)     Status: None (Preliminary result)   Collection Time: 08/13/15 10:52 PM  Result Value Ref Range Status   Specimen Description BLOOD RIGHT HAND  Final   Special Requests BOTTLES DRAWN AEROBIC AND ANAEROBIC 5CC  Final   Culture   Final    NO GROWTH 4 DAYS Performed at [facility]    Report Status PENDING  Incomplete  Blood Culture (routine x 2)     Status: None (Preliminary result)   Collection Time: 08/13/15 10:58 PM  Result Value Ref Range Status   Specimen Description BLOOD RIGHT THUMB  Final   Special Requests BOTTLES DRAWN AEROBIC AND ANAEROBIC 5CC  Final   Culture   Final    NO GROWTH 4 DAYS Performed at [facility]    Report Status PENDING  Incomplete  Urine culture     Status: None   Collection Time: 08/14/15 12:15 AM  Result Value Ref Range Status   Specimen Description URINE, CLEAN CATCH  Final   Special Requests NONE  Final   Culture   Final    >=100,000 COLONIES/mL PSEUDOMONAS AERUGINOSA Performed  at [facility]    Report Status 08/16/2015 FINAL  Final   Organism ID, Bacteria PSEUDOMONAS AERUGINOSA  Final      Susceptibility   Pseudomonas aeruginosa - MIC*    CEFTAZIDIME 4 SENSITIVE Sensitive     CIPROFLOXACIN <=0.25 SENSITIVE Sensitive     GENTAMICIN <=1 SENSITIVE Sensitive     IMIPENEM 2 SENSITIVE Sensitive     PIP/TAZO 8 SENSITIVE Sensitive     CEFEPIME 2 SENSITIVE Sensitive     * >=100,000 COLONIES/mL PSEUDOMONAS AERUGINOSA  MRSA PCR Screening     Status: None   Collection Time: 08/14/15  2:14 AM  Result Value Ref Range Status   MRSA by PCR NEGATIVE NEGATIVE Final    Comment:        The GeneXpert MRSA Assay (FDA approved for NASAL specimens only), is one component of a comprehensive MRSA colonization surveillance program. It is not intended to diagnose MRSA infection nor to guide or monitor treatment for MRSA infections.   Urine culture     Status: None   Collection Time: 08/14/15  3:07 AM  Result Value Ref Range Status   Specimen Description URINE, CLEAN CATCH  Final   Special Requests NONE  Final   Culture   Final    >=100,000 COLONIES/mL PSEUDOMONAS AERUGINOSA SUSCEPTIBILITIES PERFORMED ON PREVIOUS CULTURE WITHIN THE LAST 5 DAYS. Performed at [facility]    Report Status 08/16/2015 FINAL  Final     Labs: Basic Metabolic Panel:  Recent Labs Lab 08/14/15 0608 08/15/15 0503 08/16/15 0513 08/17/15 0453 08/18/15 0831  NA 136 134* 138 141 140  K 4.3 4.4 3.8 3.9 4.2  CL 106 106 108 109 109  CO2 18* 15* 18* 20* 18*  GLUCOSE 135* 96 94 93 114*  BUN 74* 66* 65* 60* 49*  CREATININE 5.25* 5.21* 5.02* 5.21* 4.83*  CALCIUM 9.2 8.7* 8.8* 9.2 9.1   Liver Function Tests:  Recent Labs Lab 08/13/15 2300 08/14/15 0608 08/15/15 0503 08/16/15 0513  AST 14* 20 21 9*  ALT 10* _0 ALKPHOS 96 86 81 67  BILITOT 0.3 0.6 0.8 0.4  PROT 7.2 6.4* 6.2* 5.7*  ALBUMIN 3.4* 3.0* 2.9* 2.6*   No results for input(s): LIPASE, AMYLASE in the last  168 hours. No results for input(s): AMMONIA in the last 168 hours. CBC:  Recent Labs Lab 08/14/15 0608 08/15/15 0503 08/16/15 0513 08/17/15 0453 08/18/15 0831  WBC 12.5* 12.3* 7.1 7.8 9.0  NEUTROABS 11.5* 10.2* 5.0  --   --   HGB 11.5* 10.9* 10.3* 11.3* 12.0  HCT 36.9 35.0* 32.5* 36.3 37.3  MCV 86.8 86.4 85.5 86.2 85.2  PLT 251 282 250 325 274   Cardiac Enzymes: No results for input(s): CKTOTAL, CKMB, CKMBINDEX, TROPONINI in the last 168 hours. BNP: BNP (last 3 results)  Recent Labs  02/26/15 0530 05/23/15 0602 07/04/15 1014  BNP 35.3 37.2 183.0*    ProBNP (last 3 results) No results for input(s): PROBNP in the last 8760 hours.  CBG:  Recent Labs Lab 08/13/15 2254  GLUCAP 129*

## 2015-08-19 NOTE — Progress Notes (Addendum)
Patient is alert and oriented x 4. Discharge instructions reviewed. Patient denied questions, concerns. Pt preferred to schedule own f/u visit for post dc.

## 2015-08-20 ENCOUNTER — Encounter (HOSPITAL_COMMUNITY)
Admission: RE | Admit: 2015-08-20 | Discharge: 2015-08-20 | Disposition: A | Discharge status: Home / Self Care | Payer: Medicare Other | Source: Ambulatory Visit | Attending: Nephrology | Admitting: Nephrology

## 2015-08-20 DIAGNOSIS — Z5181 Encounter for therapeutic drug level monitoring: Secondary | ICD-10-CM | POA: Insufficient documentation

## 2015-08-20 DIAGNOSIS — N184 Chronic kidney disease, stage 4 (severe): Secondary | ICD-10-CM | POA: Insufficient documentation

## 2015-08-20 DIAGNOSIS — D631 Anemia in chronic kidney disease: Secondary | ICD-10-CM | POA: Insufficient documentation

## 2015-08-20 DIAGNOSIS — Z79899 Other long term (current) drug therapy: Secondary | ICD-10-CM | POA: Insufficient documentation

## 2015-08-20 LAB — RENAL FUNCTION PANEL
Albumin: 2.6 g/dL — ABNORMAL LOW (ref 3.5–5.0)
Anion gap: 11 (ref 5–15)
BUN: 48 mg/dL — ABNORMAL HIGH (ref 6–20)
CO2: 20 mmol/L — ABNORMAL LOW (ref 22–32)
Calcium: 9 mg/dL (ref 8.9–10.3)
Chloride: 109 mmol/L (ref 101–111)
Creatinine, Ser: 4.99 mg/dL — ABNORMAL HIGH (ref 0.44–1.00)
GFR calc Af Amer: 9 mL/min — ABNORMAL LOW (ref >60)
GFR calc non Af Amer: 8 mL/min — ABNORMAL LOW (ref >60)
Glucose, Bld: 87 mg/dL (ref 65–99)
Phosphorus: 6.1 mg/dL — ABNORMAL HIGH (ref 2.5–4.6)
Potassium: 4.3 mmol/L (ref 3.5–5.1)
Sodium: 140 mmol/L (ref 135–145)

## 2015-08-27 ENCOUNTER — Encounter (HOSPITAL_COMMUNITY)
Admission: RE | Admit: 2015-08-27 | Discharge: 2015-08-27 | Disposition: A | Discharge status: Home / Self Care | Payer: Medicare Other | Source: Ambulatory Visit | Attending: Nephrology | Admitting: Nephrology

## 2015-08-27 DIAGNOSIS — N179 Acute kidney failure, unspecified: Secondary | ICD-10-CM | POA: Diagnosis not present

## 2015-08-27 DIAGNOSIS — N184 Chronic kidney disease, stage 4 (severe): Secondary | ICD-10-CM | POA: Diagnosis not present

## 2015-08-27 DIAGNOSIS — D649 Anemia, unspecified: Secondary | ICD-10-CM | POA: Diagnosis not present

## 2015-08-27 DIAGNOSIS — N139 Obstructive and reflux uropathy, unspecified: Secondary | ICD-10-CM | POA: Diagnosis not present

## 2015-08-27 DIAGNOSIS — N3010 Interstitial cystitis (chronic) without hematuria: Secondary | ICD-10-CM | POA: Diagnosis not present

## 2015-08-27 DIAGNOSIS — Z95828 Presence of other vascular implants and grafts: Secondary | ICD-10-CM | POA: Diagnosis not present

## 2015-08-27 DIAGNOSIS — I10 Essential (primary) hypertension: Secondary | ICD-10-CM | POA: Diagnosis not present

## 2015-08-27 DIAGNOSIS — N183 Chronic kidney disease, stage 3 (moderate): Secondary | ICD-10-CM | POA: Diagnosis not present

## 2015-08-27 DIAGNOSIS — I770 Arteriovenous fistula, acquired: Secondary | ICD-10-CM | POA: Diagnosis not present

## 2015-08-27 LAB — RENAL FUNCTION PANEL
Albumin: 3.1 g/dL — ABNORMAL LOW (ref 3.5–5.0)
Anion gap: 11 (ref 5–15)
BUN: 73 mg/dL — ABNORMAL HIGH (ref 6–20)
CO2: 19 mmol/L — ABNORMAL LOW (ref 22–32)
Calcium: 9.7 mg/dL (ref 8.9–10.3)
Chloride: 109 mmol/L (ref 101–111)
Creatinine, Ser: 6.49 mg/dL — ABNORMAL HIGH (ref 0.44–1.00)
GFR calc Af Amer: 7 mL/min — ABNORMAL LOW (ref >60)
GFR calc non Af Amer: 6 mL/min — ABNORMAL LOW (ref >60)
Glucose, Bld: 90 mg/dL (ref 65–99)
Phosphorus: 7.1 mg/dL — ABNORMAL HIGH (ref 2.5–4.6)
Potassium: 5.4 mmol/L — ABNORMAL HIGH (ref 3.5–5.1)
Sodium: 139 mmol/L (ref 135–145)

## 2015-08-29 ENCOUNTER — Other Ambulatory Visit: Payer: Self-pay | Admitting: Interventional Cardiology

## 2015-09-01 ENCOUNTER — Other Ambulatory Visit: Payer: Self-pay | Admitting: Interventional Cardiology

## 2015-09-01 DIAGNOSIS — A419 Sepsis, unspecified organism: Secondary | ICD-10-CM | POA: Diagnosis not present

## 2015-09-01 DIAGNOSIS — I129 Hypertensive chronic kidney disease with stage 1 through stage 4 chronic kidney disease, or unspecified chronic kidney disease: Secondary | ICD-10-CM | POA: Diagnosis not present

## 2015-09-01 DIAGNOSIS — N184 Chronic kidney disease, stage 4 (severe): Secondary | ICD-10-CM | POA: Diagnosis not present

## 2015-09-01 DIAGNOSIS — I480 Paroxysmal atrial fibrillation: Secondary | ICD-10-CM | POA: Diagnosis not present

## 2015-09-01 DIAGNOSIS — N390 Urinary tract infection, site not specified: Secondary | ICD-10-CM | POA: Diagnosis not present

## 2015-09-03 ENCOUNTER — Encounter (HOSPITAL_COMMUNITY)
Admission: RE | Admit: 2015-09-03 | Discharge: 2015-09-03 | Disposition: A | Discharge status: Home / Self Care | Payer: Medicare Other | Source: Ambulatory Visit | Attending: Nephrology | Admitting: Nephrology

## 2015-09-03 DIAGNOSIS — N184 Chronic kidney disease, stage 4 (severe): Secondary | ICD-10-CM | POA: Diagnosis not present

## 2015-09-03 LAB — RENAL FUNCTION PANEL
Albumin: 2.9 g/dL — ABNORMAL LOW (ref 3.5–5.0)
Anion gap: 12 (ref 5–15)
BUN: 80 mg/dL — ABNORMAL HIGH (ref 6–20)
CO2: 18 mmol/L — ABNORMAL LOW (ref 22–32)
Calcium: 9.4 mg/dL (ref 8.9–10.3)
Chloride: 108 mmol/L (ref 101–111)
Creatinine, Ser: 6.25 mg/dL — ABNORMAL HIGH (ref 0.44–1.00)
GFR calc Af Amer: 7 mL/min — ABNORMAL LOW (ref >60)
GFR calc non Af Amer: 6 mL/min — ABNORMAL LOW (ref >60)
Glucose, Bld: 86 mg/dL (ref 65–99)
Phosphorus: 5.8 mg/dL — ABNORMAL HIGH (ref 2.5–4.6)
Potassium: 4.6 mmol/L (ref 3.5–5.1)
Sodium: 138 mmol/L (ref 135–145)

## 2015-09-03 LAB — FERRITIN: Ferritin: 165 ng/mL (ref 11–307)

## 2015-09-03 LAB — IRON AND TIBC
Iron: 86 ug/dL (ref 28–170)
Saturation Ratios: 35 % — ABNORMAL HIGH (ref 10.4–31.8)
TIBC: 248 ug/dL — ABNORMAL LOW (ref 250–450)
UIBC: 162 ug/dL

## 2015-09-03 LAB — POCT HEMOGLOBIN-HEMACUE: Hemoglobin: 10.8 g/dL — ABNORMAL LOW (ref 12.0–15.0)

## 2015-09-03 MED ORDER — DARBEPOETIN ALFA 100 MCG/0.5ML IJ SOSY
100.0000 ug | PREFILLED_SYRINGE | INTRAMUSCULAR | Status: DC
  Administered 2015-09-03: 100 ug via SUBCUTANEOUS

## 2015-09-03 MED ORDER — DARBEPOETIN ALFA 100 MCG/0.5ML IJ SOSY
PREFILLED_SYRINGE | INTRAMUSCULAR | Status: AC
  Filled 2015-09-03: qty 0.5

## 2015-09-07 ENCOUNTER — Encounter: Payer: Self-pay | Admitting: Vascular Surgery

## 2015-09-10 ENCOUNTER — Encounter (HOSPITAL_COMMUNITY)
Admission: RE | Admit: 2015-09-10 | Discharge: 2015-09-10 | Disposition: A | Discharge status: Home / Self Care | Payer: Medicare Other | Source: Ambulatory Visit | Attending: Nephrology | Admitting: Nephrology

## 2015-09-10 DIAGNOSIS — N184 Chronic kidney disease, stage 4 (severe): Secondary | ICD-10-CM | POA: Diagnosis not present

## 2015-09-10 MED ORDER — HEPARIN SODIUM (PORCINE) 1000 UNIT/ML IJ SOLN
1000.0000 [IU] | Freq: Once | INTRAMUSCULAR | Status: AC
  Administered 2015-09-10: 3200 [IU] via INTRAVENOUS

## 2015-09-10 NOTE — Progress Notes (Signed)
Postoperative Access Visit   History of Present Illness  [name] is a 70 y.o. year old female who presents for postoperative follow-up for: L 1st BVT (Date: 07/27/15).  The patient's wounds are healed.  The patient notes no steal symptoms.  The patient is able to complete their activities of daily living.  The patient's current symptoms are: none.  Past Medical History  Diagnosis Date  . GERD (gastroesophageal reflux disease)   . Glaucoma   . H/O renal calculi 2002 & 2006  . H/O hiatal hernia   . Headache(784.0)     migraine  . Pneumonia     dx 10-06-2014 per CXR--  on 10-27-2014 pt states finished antibiotic and denies cough or fever  . Hyperlipidemia   . History of MI (myocardial infarction)     10/ 2013 in setting of Septic Shock  . History of atrial fibrillation     10/ 2013  in setting of Septic Shock  . History of CHF (congestive heart failure)     10/ 2013 in setting of septic shock  . Sigmoid diverticulosis   . Nephrolithiasis     bilateral  . CKD (chronic kidney disease), stage III     nephrologist-  dr Florene Glen  . Right ureteral stone   . History of acute respiratory failure     10/ 2013  -- ventilated in setting septic shock  . Dysrhythmia     Afib in 2013 when she had septic shock related to stone ureteral obstruction  . Myocardial infarction [facility])     Was reported in 2013 during hospitalization with septic shock  . Septic shock ([place]) 04/04/2012  . Sepsis ([place])   . Hypertension     medication removed from regimen due to low blood pressure   . Peripheral vascular disease ([place])   . S/P hemodialysis catheter insertion ([place]) 04/11/2015     right anterior chest , only used once   . History of nephrostomy 04/11/2015     currently inplace 04/28/2015   . History of blood transfusion 04/13/2015   . Low iron     hx  . Glaucoma   . Complication of anesthesia     use a little anesthesia , per patient MD states she quit breathing (2016)  . History of blood  product transfusion     Past Surgical History  Procedure Laterality Date  . Cystoscopy w/ ureteral stent placement  04/04/2012    Procedure: CYSTOSCOPY WITH RETROGRADE PYELOGRAM/URETERAL STENT PLACEMENT;  Surgeon: Ailene Rud, MD;  Location: [place];  Service: Urology;  Laterality: Left;  . Total abdominal hysterectomy w/ bilateral salpingoophorectomy  1993    secondary to fibroids  . Breast biopsy Left 08/23/07    benign fibrocystic with duct ectasia  . Cardiac catheterization  07-11-2012  dr Irish Lack    Abnormal stress test/   normal coronary arteries/  LVEDP  87mmHg  . Cardiovascular stress test  06-26-2012  dr Irish Lack    marked ischemia in the basal anterior, mid anterior, apical inferior regions/  normal LVF, ef 63%  . Transthoracic echocardiogram  04-09-2012    normal LVF,  ef 60-65%,  mild LAE,  mild TR, trivial MR and PR  . Cataract extraction w/ intraocular lens  implant, bilateral    . Knee arthroscopy Left 02-14-2003  . Laparoscopic cholecystectomy  03-23-2005  . Extracorporeal shock wave lithotripsy  05-28-2012  &  10-08-2012  . Cystoscopy with stent placement Right 10/28/2014  Procedure: RIGHT URETERAL STENT PLACEMENT;  Surgeon: Irine Seal, MD;  Location[facility];  Service: Urology;  Laterality: Right;  . Cystoscopy/retrograde/ureteroscopy/stone extraction with basket Right 11/21/2014    Procedure: CYSTOSCOPY/RIGHT RETROGRADE PYELOGRAM/RIGHT URETEROSCOPY/BASKET EXTRACTION/RIGHT PYELOSCOPY/LASER OF STONE/RIGHT DOUBLE J STENT;  Surgeon: Carolan Clines, MD;  Location: [place];  Service: Urology;  Laterality: Right;  . Holmium laser application Right [id number]    Procedure: HOLMIUM LASER APPLICATION;  Surgeon: Carolan Clines, MD;  Location: [facility];  Service: Urology;  Laterality: Right;  . Cystoscopy with stent placement Right 02/26/2015    Procedure: CYSTOSCOPY RETROGRADE PYELOGRAM WITH STENT PLACEMENT;   Surgeon: Cleon Gustin, MD;  Location: [facility];  Service: Urology;  Laterality: Right;  . Cystoscopy w/ ureteral stent placement Bilateral 05/04/2015    Procedure: CYSTOSCOPY WITH BILATERAL RETROGRADE PYELOGRAM/ WITH INTERPRETATION, EXCHANGE OF RIGHT URETERAL STENT REPLACEMENT AND PLACEMENT LEFT URETERAL STENT PLACEMENT EXAMINATION OF VAGINA;  Surgeon: Carolan Clines, MD;  Location: [facility];  Service: Urology;  Laterality: Bilateral;  . Av fistula placement Left 06/02/2015    Procedure: BRACHIOCEPHALIC ARTERIOVENOUS (AV) FISTULA CREATION ;  Surgeon: Conrad Prescott, MD;  Location: [place];  Service: Vascular;  Laterality: Left;  . Abdominal hysterectomy    . Bascilic vein transposition Left 07/27/2015    Procedure: FIRST STAGE BASILIC VEIN TRANSPOSITION LEFT UPPER ARM;  Surgeon: Conrad Hemingway, MD;  Location: [place];  Service: Vascular;  Laterality: Left;    Social History   Social History  . Marital Status: Widowed    Spouse Name: N/A  . Number of Children: 1  . Years of Education: N/A   Occupational History  . retired   . legal assistant    Social History Main Topics  . Smoking status: Never Smoker   . Smokeless tobacco: Never Used  . Alcohol Use: No  . Drug Use: No  . Sexual Activity: No     Comment: widow husband passed 5/05 with lung cancer   Other Topics Concern  . Not on file   Social History Narrative    Family History  Problem Relation Age of Onset  . Hypertension Mother   . Cancer Mother 73    breast  . Dementia Mother   . Hypertension Brother   . Diabetes Brother   . Heart disease Brother     before age 61  . Cancer Father 69    pancreatic  . Heart failure Paternal Grandmother   . Bladder Cancer Maternal Grandfather      Current Outpatient Prescriptions  Medication Sig Dispense Refill  . acetaminophen (TYLENOL) 500 MG tablet Take 500 mg by mouth every 6 (six) hours as needed for moderate pain or headache.     . bimatoprost (LUMIGAN) 0.01 % SOLN Place 1  drop into both eyes at bedtime.    . metoprolol tartrate (LOPRESSOR) 25 MG tablet Take 12.5 mg by mouth 2 (two) times daily.   0  . Nutritional Supplements (FEEDING SUPPLEMENT, NEPRO CARB STEADY,) LIQD Take 237 mLs by mouth daily. 30 Can 0  . ranitidine (ZANTAC) 150 MG tablet Take 150 mg by mouth 2 (two) times daily as needed for heartburn.    . saccharomyces boulardii (FLORASTOR) 250 MG capsule Take 1 capsule (250 mg total) by mouth 2 (two) times daily. 60 capsule 0  . sodium bicarbonate 650 MG tablet Take 1 tablet (650 mg total) by mouth 2 (two) times daily. 60 tablet 0  . sodium chloride (OCEAN)  0.65 % SOLN nasal spray Place 1 spray into both nostrils 3 (three) times daily as needed for congestion.    . timolol (BETIMOL) 0.5 % ophthalmic solution Place 1 drop into both eyes every morning.     . ciprofloxacin (CIPRO) 500 MG tablet Take 1 tablet (500 mg total) by mouth 2 (two) times daily. (Patient not taking: Reported on 09/11/2015) 14 tablet 0  . metoprolol tartrate (LOPRESSOR) 25 MG tablet TAKE 1/2 TABLET BY MOUTH TWICE DAILY (Patient not taking: Reported on 09/11/2015) 30 tablet 3   No current facility-administered medications for this visit.     Allergies  Allergen Reactions  . Vicodin [Hydrocodone-Acetaminophen] Nausea And Vomiting  . Chlorhexidine Rash    Sunburn    rash  . Percocet [Oxycodone-Acetaminophen] Nausea And Vomiting    REVIEW OF SYSTEMS:  (Positives checked otherwise negative)  CARDIOVASCULAR:   [ ]  chest pain,  [ ]  chest pressure,  [ ]  palpitations,  [ ]  shortness of breath when laying flat,  [ ]  shortness of breath with exertion,   [ ]  pain in feet when walking,  [ ]  pain in feet when laying flat, [ ]  history of blood clot in veins (DVT),  [ ]  history of phlebitis,  [ ]  swelling in legs,  [ ]  varicose veins  PULMONARY:   [ ]  productive cough,  [ ]  asthma,  [ ]  wheezing  NEUROLOGIC:   [ ]  weakness in arms or legs,  [ ]  numbness in arms or legs,  [ ]   difficulty speaking or slurred speech,  [ ]  temporary loss of vision in one eye,  [ ]  dizziness  HEMATOLOGIC:   [ ]  bleeding problems,  [ ]  problems with blood clotting too easily  MUSCULOSKEL:   [ ]  joint pain, [ ]  joint swelling  GASTROINTEST:   [ ]  vomiting blood,  [ ]  blood in stool     GENITOURINARY:   [ ]  burning with urination,  [ ]  blood in urine [x]  ureteral stents  PSYCHIATRIC:   [ ]  history of major depression  INTEGUMENTARY:   [ ]  rashes,  [ ]  ulcers  CONSTITUTIONAL:   [ ]  fever,  [ ]  chills     For VQI Use Only  PRE-ADM LIVING: Home  AMB STATUS: Ambulatory  Physical Examination Filed Vitals:   09/11/15 0823  BP: 133/75  Pulse: 71   Pulmonary: Sym exp, good air movt, CTAB, no rales, rhonchi, & wheezing  Cardiac: RRR, Nl S1, S2, no Murmurs, rubs or gallops  LUE: Incision is healed, skin feels warm, hand grip is 5/5, sensation in digits is intact, palpable thrill, bruit can be auscultated, on Sonosite: fistula > 6 mm  Medical Decision Making  [name] is a 70 y.o. year old female who presents s/p L 1st BVT, CKD Stage IV-V   The patient's access is ready for transposition.  Thank you for allowing [name] to participate in this patient's care.  Adele Barthel, MD [facility] [place] Office: [phone] Pager: [phone]  09/10/2015, 8:41 AM

## 2015-09-11 ENCOUNTER — Encounter: Payer: Self-pay | Admitting: Vascular Surgery

## 2015-09-11 ENCOUNTER — Other Ambulatory Visit: Payer: Self-pay

## 2015-09-11 ENCOUNTER — Ambulatory Visit (INDEPENDENT_AMBULATORY_CARE_PROVIDER_SITE_OTHER): Payer: Medicare Other | Admitting: Vascular Surgery

## 2015-09-11 VITALS — BP 133/75 | HR 71 | Ht 63.0 in | Wt 180.4 lb

## 2015-09-11 DIAGNOSIS — N179 Acute kidney failure, unspecified: Secondary | ICD-10-CM

## 2015-09-11 DIAGNOSIS — N184 Chronic kidney disease, stage 4 (severe): Secondary | ICD-10-CM

## 2015-09-16 ENCOUNTER — Emergency Department (HOSPITAL_COMMUNITY)
Admission: EM | Admit: 2015-09-16 | Discharge: 2015-09-16 | Disposition: A | Discharge status: Home / Self Care | Payer: Medicare Other | Attending: Emergency Medicine | Admitting: Emergency Medicine

## 2015-09-16 ENCOUNTER — Encounter (HOSPITAL_COMMUNITY): Payer: Self-pay | Admitting: Emergency Medicine

## 2015-09-16 DIAGNOSIS — R509 Fever, unspecified: Secondary | ICD-10-CM | POA: Diagnosis not present

## 2015-09-16 DIAGNOSIS — I129 Hypertensive chronic kidney disease with stage 1 through stage 4 chronic kidney disease, or unspecified chronic kidney disease: Secondary | ICD-10-CM | POA: Diagnosis not present

## 2015-09-16 DIAGNOSIS — I509 Heart failure, unspecified: Secondary | ICD-10-CM | POA: Insufficient documentation

## 2015-09-16 DIAGNOSIS — N183 Chronic kidney disease, stage 3 (moderate): Secondary | ICD-10-CM | POA: Insufficient documentation

## 2015-09-16 DIAGNOSIS — I252 Old myocardial infarction: Secondary | ICD-10-CM | POA: Insufficient documentation

## 2015-09-16 DIAGNOSIS — N1330 Unspecified hydronephrosis: Secondary | ICD-10-CM | POA: Diagnosis not present

## 2015-09-16 DIAGNOSIS — B372 Candidiasis of skin and nail: Secondary | ICD-10-CM | POA: Diagnosis not present

## 2015-09-16 DIAGNOSIS — N184 Chronic kidney disease, stage 4 (severe): Secondary | ICD-10-CM | POA: Diagnosis not present

## 2015-09-16 MED ORDER — ACETAMINOPHEN 500 MG PO TABS
500.0000 mg | ORAL_TABLET | Freq: Once | ORAL | Status: AC
  Administered 2015-09-16: 500 mg via ORAL
  Filled 2015-09-16: qty 1

## 2015-09-16 NOTE — ED Notes (Addendum)
Patient presents for fever (100.3) at home, no relief with tylenol (500mg ) approximately 2000 this evening. Daughter reports patient has had a fever indicating an infection after the last 9 procedures patient has had. Today had a basilic vein stent removed today. History of chronic kidney disease, no dialysis currently.

## 2015-09-17 ENCOUNTER — Emergency Department (HOSPITAL_COMMUNITY)
Admission: EM | Admit: 2015-09-17 | Discharge: 2015-09-17 | Disposition: A | Discharge status: Home / Self Care | Payer: Medicare Other | Attending: Physician Assistant | Admitting: Physician Assistant

## 2015-09-17 ENCOUNTER — Encounter (HOSPITAL_COMMUNITY): Payer: Self-pay | Admitting: Emergency Medicine

## 2015-09-17 ENCOUNTER — Emergency Department (HOSPITAL_COMMUNITY): Payer: Medicare Other

## 2015-09-17 DIAGNOSIS — H409 Unspecified glaucoma: Secondary | ICD-10-CM | POA: Insufficient documentation

## 2015-09-17 DIAGNOSIS — Z862 Personal history of diseases of the blood and blood-forming organs and certain disorders involving the immune mechanism: Secondary | ICD-10-CM | POA: Insufficient documentation

## 2015-09-17 DIAGNOSIS — Z992 Dependence on renal dialysis: Secondary | ICD-10-CM | POA: Diagnosis not present

## 2015-09-17 DIAGNOSIS — Z87442 Personal history of urinary calculi: Secondary | ICD-10-CM | POA: Diagnosis not present

## 2015-09-17 DIAGNOSIS — K219 Gastro-esophageal reflux disease without esophagitis: Secondary | ICD-10-CM | POA: Insufficient documentation

## 2015-09-17 DIAGNOSIS — I509 Heart failure, unspecified: Secondary | ICD-10-CM | POA: Insufficient documentation

## 2015-09-17 DIAGNOSIS — R42 Dizziness and giddiness: Secondary | ICD-10-CM | POA: Diagnosis not present

## 2015-09-17 DIAGNOSIS — N186 End stage renal disease: Secondary | ICD-10-CM | POA: Diagnosis not present

## 2015-09-17 DIAGNOSIS — Z9889 Other specified postprocedural states: Secondary | ICD-10-CM | POA: Insufficient documentation

## 2015-09-17 DIAGNOSIS — I4891 Unspecified atrial fibrillation: Secondary | ICD-10-CM | POA: Diagnosis not present

## 2015-09-17 DIAGNOSIS — Z792 Long term (current) use of antibiotics: Secondary | ICD-10-CM | POA: Insufficient documentation

## 2015-09-17 DIAGNOSIS — Z79899 Other long term (current) drug therapy: Secondary | ICD-10-CM | POA: Diagnosis not present

## 2015-09-17 DIAGNOSIS — R5383 Other fatigue: Secondary | ICD-10-CM | POA: Insufficient documentation

## 2015-09-17 DIAGNOSIS — R32 Unspecified urinary incontinence: Secondary | ICD-10-CM | POA: Insufficient documentation

## 2015-09-17 DIAGNOSIS — Z8701 Personal history of pneumonia (recurrent): Secondary | ICD-10-CM | POA: Diagnosis not present

## 2015-09-17 DIAGNOSIS — R410 Disorientation, unspecified: Secondary | ICD-10-CM | POA: Diagnosis not present

## 2015-09-17 DIAGNOSIS — R509 Fever, unspecified: Secondary | ICD-10-CM | POA: Diagnosis not present

## 2015-09-17 DIAGNOSIS — Z8619 Personal history of other infectious and parasitic diseases: Secondary | ICD-10-CM | POA: Insufficient documentation

## 2015-09-17 DIAGNOSIS — I120 Hypertensive chronic kidney disease with stage 5 chronic kidney disease or end stage renal disease: Secondary | ICD-10-CM | POA: Diagnosis not present

## 2015-09-17 DIAGNOSIS — I252 Old myocardial infarction: Secondary | ICD-10-CM | POA: Insufficient documentation

## 2015-09-17 DIAGNOSIS — R531 Weakness: Secondary | ICD-10-CM | POA: Diagnosis present

## 2015-09-17 LAB — COMPREHENSIVE METABOLIC PANEL
ALT: 16 U/L (ref 14–54)
AST: 21 U/L (ref 15–41)
Albumin: 3.1 g/dL — ABNORMAL LOW (ref 3.5–5.0)
Alkaline Phosphatase: 85 U/L (ref 38–126)
Anion gap: 12 (ref 5–15)
BUN: 75 mg/dL — ABNORMAL HIGH (ref 6–20)
CO2: 18 mmol/L — ABNORMAL LOW (ref 22–32)
Calcium: 9.3 mg/dL (ref 8.9–10.3)
Chloride: 106 mmol/L (ref 101–111)
Creatinine, Ser: 6.21 mg/dL — ABNORMAL HIGH (ref 0.44–1.00)
GFR calc Af Amer: 7 mL/min — ABNORMAL LOW (ref >60)
GFR calc non Af Amer: 6 mL/min — ABNORMAL LOW (ref >60)
Glucose, Bld: 105 mg/dL — ABNORMAL HIGH (ref 65–99)
Potassium: 4.8 mmol/L (ref 3.5–5.1)
Sodium: 136 mmol/L (ref 135–145)
Total Bilirubin: 0.3 mg/dL (ref 0.3–1.2)
Total Protein: 6.7 g/dL (ref 6.5–8.1)

## 2015-09-17 LAB — CBC WITH DIFFERENTIAL/PLATELET
Basophils Absolute: 0 10*3/uL (ref 0.0–0.1)
Basophils Relative: 0 %
Eosinophils Absolute: 0.1 10*3/uL (ref 0.0–0.7)
Eosinophils Relative: 0 %
HCT: 34.1 % — ABNORMAL LOW (ref 36.0–46.0)
Hemoglobin: 10.8 g/dL — ABNORMAL LOW (ref 12.0–15.0)
Lymphocytes Relative: 4 %
Lymphs Abs: 0.8 10*3/uL (ref 0.7–4.0)
MCH: 27.1 pg (ref 26.0–34.0)
MCHC: 31.7 g/dL (ref 30.0–36.0)
MCV: 85.7 fL (ref 78.0–100.0)
Monocytes Absolute: 1.3 10*3/uL — ABNORMAL HIGH (ref 0.1–1.0)
Monocytes Relative: 7 %
Neutro Abs: 16.8 10*3/uL — ABNORMAL HIGH (ref 1.7–7.7)
Neutrophils Relative %: 89 %
Platelets: 256 10*3/uL (ref 150–400)
RBC: 3.98 MIL/uL (ref 3.87–5.11)
RDW: 16.7 % — ABNORMAL HIGH (ref 11.5–15.5)
WBC: 19 10*3/uL — ABNORMAL HIGH (ref 4.0–10.5)

## 2015-09-17 LAB — I-STAT TROPONIN, ED: Troponin i, poc: 0 ng/mL (ref 0.00–0.08)

## 2015-09-17 LAB — I-STAT CG4 LACTIC ACID, ED: Lactic Acid, Venous: 1 mmol/L (ref 0.5–2.0)

## 2015-09-17 NOTE — ED Notes (Signed)
Discharge instructions and follow up care reviewed with patient. Patient verbalized understanding. 

## 2015-09-17 NOTE — Discharge Instructions (Signed)
We talked with your urologist. They want you to follow-up tomorrow morning. Here your creatinine is a same as it was back on the 13th. Your vital signs are all normal.     Fatigue Fatigue is feeling tired all of the time, a lack of energy, or a lack of motivation. Occasional or mild fatigue is often a normal response to activity or life in general. However, long-lasting (chronic) or extreme fatigue may indicate an underlying medical condition. HOME CARE INSTRUCTIONS  Watch your fatigue for any changes. The following actions may help to lessen any discomfort you are feeling:  Talk to your health care provider about how much sleep you need each night. Try to get the required amount every night.  Take medicines only as directed by your health care provider.  Eat a healthy and nutritious diet. Ask your health care provider if you need help changing your diet.  Drink enough fluid to keep your urine clear or pale yellow.  Practice ways of relaxing, such as yoga, meditation, massage therapy, or acupuncture.  Exercise regularly.   Change situations that cause you stress. Try to keep your work and personal routine reasonable.  Do not abuse illegal drugs.  Limit alcohol intake to no more than 1 drink per day for nonpregnant women and 2 drinks per day for men. One drink equals 12 ounces of beer, 5 ounces of wine, or 1 ounces of hard liquor.  Take a multivitamin, if directed by your health care provider. SEEK MEDICAL CARE IF:   Your fatigue does not get better.  You have a fever.   You have unintentional weight loss or gain.  You have headaches.   You have difficulty:   Falling asleep.  Sleeping throughout the night.  You feel angry, guilty, anxious, or sad.   You are unable to have a bowel movement (constipation).   You skin is dry.   Your legs or another part of your body is swollen.  SEEK IMMEDIATE MEDICAL CARE IF:   You feel confused.   Your vision is  blurry.  You feel faint or pass out.   You have a severe headache.   You have severe abdominal, pelvic, or back pain.   You have chest pain, shortness of breath, or an irregular or fast heartbeat.   You are unable to urinate or you urinate less than normal.   You develop abnormal bleeding, such as bleeding from the rectum, vagina, nose, lungs, or nipples.  You vomit blood.   You have thoughts about harming yourself or committing suicide.   You are worried that you might harm someone else.    This information is not intended to replace advice given to you by your health care provider. Make sure you discuss any questions you have with your health care provider.   Document Released: 04/03/2007 Document Revised: 06/27/2014 Document Reviewed: 10/08/2013 Elsevier Interactive Patient Education [employer].

## 2015-09-17 NOTE — ED Notes (Signed)
Pt reports headache, fatigue , was seen yesterday for similar symptoms. Family report fever and shortness of breath yet pt denies it at this time, Hx  chronic kidney disease. Presents diatech port, family reports that we can use it for blood draw with MD permission. Hx of 1 dialysis per family. Pt is alert and oriented x 4 .

## 2015-09-17 NOTE — ED Notes (Signed)
Bladder scanner performed. Bladder showing 26 mL of urine. Dr. Thomasene Lot made aware

## 2015-09-17 NOTE — ED Provider Notes (Addendum)
CSN: [id number]     Arrival date & time 09/17/15  1216 History   First MD Initiated Contact with Patient 09/17/15 1312     Chief Complaint  Patient presents with  . Shortness of Breath  . Weakness     (Consider location/radiation/quality/duration/timing/severity/associated sxs/prior Treatment) HPI  Patient is a 70 year old female sent in with very complicated past medical history.  PMH  significant for multiple episodes of sepsis. Patient is end-stage renal disease, CK D stage V. Patient's having fistula made on her left arm for initiation of dialysis soon. Patient has had percutaneous nephrectomy drain on the right-hand side. On her left kidney she had a recent stent that was removed yesterday. Patient's family reports that since the stent was removed yesterday patient has had no urination. They also reports she's had occasional low-grade fevers and confusion. They report that this is often associated with sepsis as required admission past.   Patient is followed by Dr. Gaynelle Arabian [facility].   Past Medical History  Diagnosis Date  . GERD (gastroesophageal reflux disease)   . Glaucoma   . H/O renal calculi 2002 & 2006  . H/O hiatal hernia   . Headache(784.0)     migraine  . Pneumonia     dx 10-06-2014 per CXR--  on 10-27-2014 pt states finished antibiotic and denies cough or fever  . Hyperlipidemia   . History of MI (myocardial infarction)     10/ 2013 in setting of Septic Shock  . History of atrial fibrillation     10/ 2013  in setting of Septic Shock  . History of CHF (congestive heart failure)     10/ 2013 in setting of septic shock  . Sigmoid diverticulosis   . Nephrolithiasis     bilateral  . CKD (chronic kidney disease), stage III     nephrologist-  dr Florene Glen  . Right ureteral stone   . History of acute respiratory failure     10/ 2013  -- ventilated in setting septic shock  . Dysrhythmia     Afib in 2013 when she had septic shock related to stone ureteral  obstruction  . Myocardial infarction [facility])     Was reported in 2013 during hospitalization with septic shock  . Septic shock ([place]) 04/04/2012  . Sepsis ([place])   . Hypertension     medication removed from regimen due to low blood pressure   . Peripheral vascular disease ([place])   . S/P hemodialysis catheter insertion ([place]) 04/11/2015     right anterior chest , only used once   . History of nephrostomy 04/11/2015     currently inplace 04/28/2015   . History of blood transfusion 04/13/2015   . Low iron     hx  . Glaucoma   . Complication of anesthesia     use a little anesthesia , per patient MD states she quit breathing (2016)  . History of blood product transfusion    Past Surgical History  Procedure Laterality Date  . Cystoscopy w/ ureteral stent placement  04/04/2012    Procedure: CYSTOSCOPY WITH RETROGRADE PYELOGRAM/URETERAL STENT PLACEMENT;  Surgeon: Ailene Rud, MD;  Location: [place];  Service: Urology;  Laterality: Left;  . Total abdominal hysterectomy w/ bilateral salpingoophorectomy  1993    secondary to fibroids  . Breast biopsy Left 08/23/07    benign fibrocystic with duct ectasia  . Cardiac catheterization  07-11-2012  dr Irish Lack    Abnormal stress test/   normal coronary arteries/  LVEDP  20mmHg  . Cardiovascular stress test  06-26-2012  dr Irish Lack    marked ischemia in the basal anterior, mid anterior, apical inferior regions/  normal LVF, ef 63%  . Transthoracic echocardiogram  04-09-2012    normal LVF,  ef 60-65%,  mild LAE,  mild TR, trivial MR and PR  . Cataract extraction w/ intraocular lens  implant, bilateral    . Knee arthroscopy Left 02-14-2003  . Laparoscopic cholecystectomy  03-23-2005  . Extracorporeal shock wave lithotripsy  05-28-2012  &  10-08-2012  . Cystoscopy with stent placement Right 10/28/2014    Procedure: RIGHT URETERAL STENT PLACEMENT;  Surgeon: Irine Seal, MD;  Location: [facility];  Service: Urology;  Laterality: Right;   . Cystoscopy/retrograde/ureteroscopy/stone extraction with basket Right 11/21/2014    Procedure: CYSTOSCOPY/RIGHT RETROGRADE PYELOGRAM/RIGHT URETEROSCOPY/BASKET EXTRACTION/RIGHT PYELOSCOPY/LASER OF STONE/RIGHT DOUBLE J STENT;  Surgeon: Carolan Clines, MD;  Location: [place];  Service: Urology;  Laterality: Right;  . Holmium laser application Right [id number]    Procedure: HOLMIUM LASER APPLICATION;  Surgeon: Carolan Clines, MD;  Location: [facility];  Service: Urology;  Laterality: Right;  . Cystoscopy with stent placement Right 02/26/2015    Procedure: CYSTOSCOPY RETROGRADE PYELOGRAM WITH STENT PLACEMENT;  Surgeon: Cleon Gustin, MD;  Location: [facility];  Service: Urology;  Laterality: Right;  . Cystoscopy w/ ureteral stent placement Bilateral 05/04/2015    Procedure: CYSTOSCOPY WITH BILATERAL RETROGRADE PYELOGRAM/ WITH INTERPRETATION, EXCHANGE OF RIGHT URETERAL STENT REPLACEMENT AND PLACEMENT LEFT URETERAL STENT PLACEMENT EXAMINATION OF VAGINA;  Surgeon: Carolan Clines, MD;  Location: [facility];  Service: Urology;  Laterality: Bilateral;  . Av fistula placement Left 06/02/2015    Procedure: BRACHIOCEPHALIC ARTERIOVENOUS (AV) FISTULA CREATION ;  Surgeon: Conrad Hocking, MD;  Location: [place];  Service: Vascular;  Laterality: Left;  . Abdominal hysterectomy    . Bascilic vein transposition Left 07/27/2015    Procedure: FIRST STAGE BASILIC VEIN TRANSPOSITION LEFT UPPER ARM;  Surgeon: Conrad Midfield, MD;  [facility];  Service: Vascular;  Laterality: Left;   Family History  Problem Relation Age of Onset  . Hypertension Mother   . Cancer Mother 8    breast  . Dementia Mother   . Hypertension Brother   . Diabetes Brother   . Heart disease Brother     before age 33  . Cancer Father 66    pancreatic  . Heart failure Paternal Grandmother   . Bladder Cancer Maternal Grandfather    Social History  Substance Use Topics  . Smoking status: Never Smoker   .  Smokeless tobacco: Never Used  . Alcohol Use: No   OB History    Gravida Para Term Preterm AB TAB SAB Ectopic Multiple Living   1 1 1       1      Review of Systems  Constitutional: Positive for fatigue. Negative for fever and activity change.  HENT: Negative for congestion.   Respiratory: Negative for shortness of breath.   Cardiovascular: Negative for chest pain.  Gastrointestinal: Negative for abdominal pain.  Genitourinary: Positive for enuresis. Negative for dysuria.  Neurological: Positive for light-headedness. Negative for dizziness.  Psychiatric/Behavioral: Positive for confusion.      Allergies  Vicodin; Chlorhexidine; and Percocet  Home Medications   Prior to Admission medications   Medication Sig Start Date End Date Taking? Authorizing Provider  acetaminophen (TYLENOL) 500 MG tablet Take 500 mg by mouth every 6 (six) hours as needed for moderate pain or  headache.    Yes Historical Provider, MD  bimatoprost (LUMIGAN) 0.01 % SOLN Place 1 drop into both eyes at bedtime.   Yes Historical Provider, MD  ciprofloxacin (CIPRO) 250 MG tablet Take 250 mg by mouth 2 (two) times daily.   Yes Historical Provider, MD  metoprolol tartrate (LOPRESSOR) 25 MG tablet TAKE 1/2 TABLET BY MOUTH TWICE DAILY Patient taking differently: Take one-half tablet (12.5 mg) by mouth twice daily. 09/02/15  Yes Jettie Booze, MD  Nutritional Supplements (FEEDING SUPPLEMENT, NEPRO CARB STEADY,) LIQD Take 237 mLs by mouth daily. 05/07/15  Yes Maryann Mikhail, DO  ranitidine (ZANTAC) 150 MG tablet Take 150 mg by mouth 2 (two) times daily as needed for heartburn.   Yes Historical Provider, MD  saccharomyces boulardii (FLORASTOR) 250 MG capsule Take 1 capsule (250 mg total) by mouth 2 (two) times daily. 07/08/15  Yes Donne Hazel, MD  sodium bicarbonate 650 MG tablet Take 1 tablet (650 mg total) by mouth 2 (two) times daily. 05/30/15  Yes Robbie Lis, MD  sodium chloride (OCEAN) 0.65 % SOLN nasal  spray Place 1 spray into both nostrils 3 (three) times daily as needed for congestion.   Yes Historical Provider, MD  timolol (BETIMOL) 0.5 % ophthalmic solution Place 1 drop into both eyes every morning.    Yes Historical Provider, MD  ciprofloxacin (CIPRO) 500 MG tablet Take 1 tablet (500 mg total) by mouth 2 (two) times daily. Patient not taking: Reported on 09/11/2015 08/19/15   Robbie Lis, MD  Nystatin [facility]) 100000 UNIT/GM POWD Apply 1 application topically 2 (two) times daily.  09/16/15   Historical Provider, MD   BP 116/63 mmHg  Pulse 84  Temp(Src) 98 F (36.7 C) (Oral)  Resp 24  SpO2 98%  LMP 01/19/1992 Physical Exam  Constitutional: She is oriented to person, place, and time. She appears well-developed and well-nourished.  HENT:  Head: Normocephalic and atraumatic.  Eyes: Conjunctivae are normal. Right eye exhibits no discharge.  Neck: Neck supple.  Cardiovascular: Normal rate, regular rhythm and normal heart sounds.   No murmur heard. Pulmonary/Chest: Effort normal and breath sounds normal. She has no wheezes. She has no rales.  Catheter in place and central chest.  Abdominal: Soft. She exhibits no distension. There is no tenderness.  Drain in place over her right flank.  Musculoskeletal: Normal range of motion. She exhibits no edema.  Positive thrill left upper extremity  Neurological: She is oriented to person, place, and time. No cranial nerve deficit.  Skin: Skin is warm and dry. No rash noted. She is not diaphoretic.  Psychiatric: She has a normal mood and affect.  Nursing note and vitals reviewed.   ED Course  Procedures (including critical care time) Labs Review Labs Reviewed  COMPREHENSIVE METABOLIC PANEL - Abnormal; Notable for the following:    CO2 18 (*)    Glucose, Bld 105 (*)    BUN 75 (*)    Creatinine, Ser 6.21 (*)    Albumin 3.1 (*)    GFR calc non Af Amer 6 (*)    GFR calc Af Amer 7 (*)    All other components within normal limits  CBC WITH  DIFFERENTIAL/PLATELET - Abnormal; Notable for the following:    WBC 19.0 (*)    Hemoglobin 10.8 (*)    HCT 34.1 (*)    RDW 16.7 (*)    Neutro Abs 16.8 (*)    Monocytes Absolute 1.3 (*)    All other components within  normal limits  CBC WITH DIFFERENTIAL/PLATELET  I-STAT TROPOININ, ED  I-STAT CG4 LACTIC ACID, ED    Imaging Review Dg Chest 2 View  09/17/2015  CLINICAL DATA:  Fever and headache EXAM: CHEST  2 VIEW COMPARISON:  August 13, 2015 FINDINGS: There is slight scarring in the left base. Lungs elsewhere clear. Heart size and pulmonary vascularity are normal. No adenopathy. Central catheter tip is in the superior vena cava. No pneumothorax. There is a moderate hiatal hernia. There is a drain in the right retroperitoneal region. IMPRESSION: Slight scarring left base. No edema or consolidation. Hiatal hernia present. No pneumothorax. Electronically Signed   By: Lowella Grip III M.D.   On: 09/17/2015 13:54   I have personally reviewed and evaluated these images and lab results as part of my medical decision-making.   EKG Interpretation   Date/Time:  Thursday September 17 2015 13:55:42 EDT Ventricular Rate:  82 PR Interval:  174 QRS Duration: 87 QT Interval:  356 QTC Calculation: 416 R Axis:   89 Text Interpretation:  Sinus rhythm Borderline right axis deviation  Abnormal R-wave progression, early transition No significant change since  last tracing Confirmed by Gerald Leitz ([id number]) on 09/17/2015 2:00:48  PM      MDM   Final diagnoses:  Other fatigue    Patient is a pleasant 70 year old female with couple. Past medical history including chronic kidney disease stage V with recent fistula and left upper arm to initiate dialysis. Patient also had recent stents pulled in her left kidney. Since the pulling of that stent she has not been able to make urine.  Patient's family reports mild confusion and low-grade fevers at home. On arrival here patient has afebrile and alert  and oriented 3 with no signs of infection. Bladder scanner shows 10 mL. Concerning that the left kidney is not draining.  Creatinine is largely unchanged from prior. We'll touch base with Dr. Minus Liberty.   3:39 PM Discussed with [facility]. Since patient has draining right kidney there is no emergent need for admission at this time. Especially given that she has no fever and no vital sign abnormalities. We will have patient call tomorrow morning and [facility] said  that they will try to get an appointment with Minus Liberty for patient tomorrow.  3:41 PM They just called and they have an appointment for her tomorrow at 12:30.  Daivik Overley Julio Alm, MD 09/17/15 Standard, MD 09/17/15 1544

## 2015-09-18 ENCOUNTER — Encounter (HOSPITAL_COMMUNITY): Payer: Medicare Other

## 2015-09-18 DIAGNOSIS — N1330 Unspecified hydronephrosis: Secondary | ICD-10-CM | POA: Diagnosis not present

## 2015-09-18 DIAGNOSIS — N184 Chronic kidney disease, stage 4 (severe): Secondary | ICD-10-CM | POA: Diagnosis not present

## 2015-09-18 DIAGNOSIS — N3000 Acute cystitis without hematuria: Secondary | ICD-10-CM | POA: Diagnosis not present

## 2015-09-19 HISTORY — PX: BASCILIC VEIN TRANSPOSITION: SHX5742

## 2015-09-21 ENCOUNTER — Encounter (HOSPITAL_COMMUNITY): Payer: Medicare Other

## 2015-09-23 ENCOUNTER — Ambulatory Visit: Payer: Medicare Other | Admitting: Nurse Practitioner

## 2015-09-23 ENCOUNTER — Encounter (HOSPITAL_COMMUNITY)
Admission: RE | Admit: 2015-09-23 | Discharge: 2015-09-23 | Disposition: A | Discharge status: Home / Self Care | Payer: Medicare Other | Source: Ambulatory Visit | Attending: Nephrology | Admitting: Nephrology

## 2015-09-23 DIAGNOSIS — D631 Anemia in chronic kidney disease: Secondary | ICD-10-CM | POA: Insufficient documentation

## 2015-09-23 DIAGNOSIS — N184 Chronic kidney disease, stage 4 (severe): Secondary | ICD-10-CM | POA: Diagnosis not present

## 2015-09-23 DIAGNOSIS — Z79899 Other long term (current) drug therapy: Secondary | ICD-10-CM | POA: Insufficient documentation

## 2015-09-23 DIAGNOSIS — Z5181 Encounter for therapeutic drug level monitoring: Secondary | ICD-10-CM | POA: Diagnosis not present

## 2015-09-24 ENCOUNTER — Ambulatory Visit: Payer: Medicare Other | Admitting: Nurse Practitioner

## 2015-09-24 ENCOUNTER — Other Ambulatory Visit (HOSPITAL_COMMUNITY): Payer: Self-pay | Admitting: Urology

## 2015-09-24 ENCOUNTER — Other Ambulatory Visit (HOSPITAL_COMMUNITY): Payer: Self-pay | Admitting: Interventional Radiology

## 2015-09-24 ENCOUNTER — Ambulatory Visit (HOSPITAL_COMMUNITY)
Admission: RE | Admit: 2015-09-24 | Discharge: 2015-09-24 | Disposition: A | Discharge status: Home / Self Care | Payer: Medicare Other | Source: Ambulatory Visit | Attending: Urology | Admitting: Urology

## 2015-09-24 DIAGNOSIS — N1330 Unspecified hydronephrosis: Secondary | ICD-10-CM

## 2015-09-24 MED ORDER — IOPAMIDOL (ISOVUE-300) INJECTION 61%
10.0000 mL | Freq: Once | INTRAVENOUS | Status: AC | PRN
  Administered 2015-09-24: 10 mL

## 2015-09-24 NOTE — Procedures (Signed)
Successful RT PCN EXCHG No comp Stable Full report in pacs

## 2015-09-25 ENCOUNTER — Other Ambulatory Visit: Payer: Self-pay

## 2015-09-25 ENCOUNTER — Encounter (HOSPITAL_COMMUNITY): Payer: Self-pay

## 2015-09-25 ENCOUNTER — Inpatient Hospital Stay (HOSPITAL_COMMUNITY)
Admission: EM | Admit: 2015-09-25 | Discharge: 2015-09-28 | DRG: 698 | Disposition: A | Discharge status: Home / Self Care | Payer: Medicare Other | Attending: Internal Medicine | Admitting: Internal Medicine

## 2015-09-25 DIAGNOSIS — R509 Fever, unspecified: Secondary | ICD-10-CM | POA: Diagnosis not present

## 2015-09-25 DIAGNOSIS — E785 Hyperlipidemia, unspecified: Secondary | ICD-10-CM | POA: Diagnosis present

## 2015-09-25 DIAGNOSIS — R6520 Severe sepsis without septic shock: Secondary | ICD-10-CM | POA: Diagnosis not present

## 2015-09-25 DIAGNOSIS — K219 Gastro-esophageal reflux disease without esophagitis: Secondary | ICD-10-CM | POA: Diagnosis present

## 2015-09-25 DIAGNOSIS — N185 Chronic kidney disease, stage 5: Secondary | ICD-10-CM | POA: Diagnosis present

## 2015-09-25 DIAGNOSIS — I120 Hypertensive chronic kidney disease with stage 5 chronic kidney disease or end stage renal disease: Secondary | ICD-10-CM | POA: Diagnosis not present

## 2015-09-25 DIAGNOSIS — N186 End stage renal disease: Secondary | ICD-10-CM

## 2015-09-25 DIAGNOSIS — I10 Essential (primary) hypertension: Secondary | ICD-10-CM | POA: Diagnosis not present

## 2015-09-25 DIAGNOSIS — N179 Acute kidney failure, unspecified: Secondary | ICD-10-CM | POA: Diagnosis not present

## 2015-09-25 DIAGNOSIS — T83512A Infection and inflammatory reaction due to nephrostomy catheter, initial encounter: Principal | ICD-10-CM | POA: Diagnosis present

## 2015-09-25 DIAGNOSIS — N136 Pyonephrosis: Secondary | ICD-10-CM | POA: Diagnosis not present

## 2015-09-25 DIAGNOSIS — I129 Hypertensive chronic kidney disease with stage 1 through stage 4 chronic kidney disease, or unspecified chronic kidney disease: Secondary | ICD-10-CM | POA: Diagnosis present

## 2015-09-25 DIAGNOSIS — I252 Old myocardial infarction: Secondary | ICD-10-CM | POA: Diagnosis not present

## 2015-09-25 DIAGNOSIS — R319 Hematuria, unspecified: Secondary | ICD-10-CM | POA: Diagnosis not present

## 2015-09-25 DIAGNOSIS — I2510 Atherosclerotic heart disease of native coronary artery without angina pectoris: Secondary | ICD-10-CM | POA: Diagnosis present

## 2015-09-25 DIAGNOSIS — R51 Headache: Secondary | ICD-10-CM | POA: Diagnosis not present

## 2015-09-25 DIAGNOSIS — N390 Urinary tract infection, site not specified: Secondary | ICD-10-CM | POA: Diagnosis not present

## 2015-09-25 DIAGNOSIS — I739 Peripheral vascular disease, unspecified: Secondary | ICD-10-CM | POA: Diagnosis present

## 2015-09-25 DIAGNOSIS — D638 Anemia in other chronic diseases classified elsewhere: Secondary | ICD-10-CM | POA: Diagnosis present

## 2015-09-25 DIAGNOSIS — T7589XA Other specified effects of external causes, initial encounter: Secondary | ICD-10-CM | POA: Diagnosis present

## 2015-09-25 DIAGNOSIS — A419 Sepsis, unspecified organism: Secondary | ICD-10-CM | POA: Diagnosis present

## 2015-09-25 DIAGNOSIS — Z936 Other artificial openings of urinary tract status: Secondary | ICD-10-CM | POA: Diagnosis not present

## 2015-09-25 DIAGNOSIS — N12 Tubulo-interstitial nephritis, not specified as acute or chronic: Secondary | ICD-10-CM

## 2015-09-25 LAB — URINALYSIS, ROUTINE W REFLEX MICROSCOPIC
Bilirubin Urine: NEGATIVE
Glucose, UA: NEGATIVE mg/dL
Ketones, ur: NEGATIVE mg/dL
Nitrite: NEGATIVE
Protein, ur: 100 mg/dL — AB
Specific Gravity, Urine: 1.014 (ref 1.005–1.030)
pH: 6 (ref 5.0–8.0)

## 2015-09-25 LAB — CBC WITH DIFFERENTIAL/PLATELET
Basophils Absolute: 0 10*3/uL (ref 0.0–0.1)
Basophils Relative: 0 %
Eosinophils Absolute: 0 10*3/uL (ref 0.0–0.7)
Eosinophils Relative: 0 %
HCT: 34.7 % — ABNORMAL LOW (ref 36.0–46.0)
Hemoglobin: 10.8 g/dL — ABNORMAL LOW (ref 12.0–15.0)
Lymphocytes Relative: 3 %
Lymphs Abs: 0.4 10*3/uL — ABNORMAL LOW (ref 0.7–4.0)
MCH: 26.2 pg (ref 26.0–34.0)
MCHC: 31.1 g/dL (ref 30.0–36.0)
MCV: 84.2 fL (ref 78.0–100.0)
Monocytes Absolute: 0.6 10*3/uL (ref 0.1–1.0)
Monocytes Relative: 4 %
Neutro Abs: 14.5 10*3/uL — ABNORMAL HIGH (ref 1.7–7.7)
Neutrophils Relative %: 93 %
Platelets: 312 10*3/uL (ref 150–400)
RBC: 4.12 MIL/uL (ref 3.87–5.11)
RDW: 16.6 % — ABNORMAL HIGH (ref 11.5–15.5)
WBC: 15.5 10*3/uL — ABNORMAL HIGH (ref 4.0–10.5)

## 2015-09-25 LAB — I-STAT CG4 LACTIC ACID, ED: Lactic Acid, Venous: 1.13 mmol/L (ref 0.5–2.0)

## 2015-09-25 LAB — BASIC METABOLIC PANEL
Anion gap: 12 (ref 5–15)
BUN: 85 mg/dL — ABNORMAL HIGH (ref 6–20)
CO2: 15 mmol/L — ABNORMAL LOW (ref 22–32)
Calcium: 8.8 mg/dL — ABNORMAL LOW (ref 8.9–10.3)
Chloride: 104 mmol/L (ref 101–111)
Creatinine, Ser: 7.07 mg/dL — ABNORMAL HIGH (ref 0.44–1.00)
GFR calc Af Amer: 6 mL/min — ABNORMAL LOW (ref >60)
GFR calc non Af Amer: 5 mL/min — ABNORMAL LOW (ref >60)
Glucose, Bld: 110 mg/dL — ABNORMAL HIGH (ref 65–99)
Potassium: 5 mmol/L (ref 3.5–5.1)
Sodium: 131 mmol/L — ABNORMAL LOW (ref 135–145)

## 2015-09-25 LAB — PROTIME-INR
INR: 1.27 (ref 0.00–1.49)
Prothrombin Time: 15.6 seconds — ABNORMAL HIGH (ref 11.6–15.2)

## 2015-09-25 LAB — URINE MICROSCOPIC-ADD ON

## 2015-09-25 LAB — APTT: aPTT: 34 seconds (ref 24–37)

## 2015-09-25 MED ORDER — TIMOLOL HEMIHYDRATE 0.5 % OP SOLN
1.0000 [drp] | Freq: Every morning | OPHTHALMIC | Status: DC
  Administered 2015-09-26 – 2015-09-28 (×3): 1 [drp] via OPHTHALMIC
  Filled 2015-09-25: qty 5

## 2015-09-25 MED ORDER — MORPHINE SULFATE (PF) 2 MG/ML IV SOLN
2.0000 mg | INTRAVENOUS | Status: DC | PRN
  Administered 2015-09-25 – 2015-09-27 (×2): 2 mg via INTRAVENOUS
  Filled 2015-09-25 (×2): qty 1

## 2015-09-25 MED ORDER — SODIUM CHLORIDE 0.9 % IV SOLN
INTRAVENOUS | Status: DC
  Administered 2015-09-25 – 2015-09-26 (×2): via INTRAVENOUS
  Administered 2015-09-27: 75 mL/h via INTRAVENOUS

## 2015-09-25 MED ORDER — LATANOPROST 0.005 % OP SOLN
1.0000 [drp] | Freq: Every day | OPHTHALMIC | Status: DC
  Administered 2015-09-26 – 2015-09-27 (×2): 1 [drp] via OPHTHALMIC
  Filled 2015-09-25 (×2): qty 2.5

## 2015-09-25 MED ORDER — ACETAMINOPHEN 325 MG PO TABS
650.0000 mg | ORAL_TABLET | Freq: Once | ORAL | Status: AC
  Administered 2015-09-25: 650 mg via ORAL
  Filled 2015-09-25: qty 2

## 2015-09-25 MED ORDER — SODIUM CHLORIDE 0.9 % IV BOLUS (SEPSIS)
1000.0000 mL | INTRAVENOUS | Status: AC
  Administered 2015-09-25 (×2): 1000 mL via INTRAVENOUS

## 2015-09-25 MED ORDER — FAMOTIDINE 20 MG PO TABS
20.0000 mg | ORAL_TABLET | Freq: Two times a day (BID) | ORAL | Status: DC
  Administered 2015-09-25: 20 mg via ORAL
  Filled 2015-09-25 (×3): qty 1

## 2015-09-25 MED ORDER — SALINE SPRAY 0.65 % NA SOLN
1.0000 | Freq: Three times a day (TID) | NASAL | Status: DC | PRN
  Filled 2015-09-25: qty 44

## 2015-09-25 MED ORDER — SODIUM BICARBONATE 650 MG PO TABS
650.0000 mg | ORAL_TABLET | Freq: Two times a day (BID) | ORAL | Status: DC
  Administered 2015-09-25 – 2015-09-28 (×6): 650 mg via ORAL
  Filled 2015-09-25 (×7): qty 1

## 2015-09-25 MED ORDER — ACETAMINOPHEN 650 MG RE SUPP
650.0000 mg | Freq: Four times a day (QID) | RECTAL | Status: DC | PRN
Start: 2015-09-25 — End: 2015-09-28

## 2015-09-25 MED ORDER — ACETAMINOPHEN 325 MG PO TABS
650.0000 mg | ORAL_TABLET | Freq: Four times a day (QID) | ORAL | Status: DC | PRN
  Administered 2015-09-25 – 2015-09-28 (×6): 650 mg via ORAL
  Filled 2015-09-25 (×6): qty 2

## 2015-09-25 MED ORDER — SODIUM CHLORIDE 0.9 % IV BOLUS (SEPSIS)
500.0000 mL | INTRAVENOUS | Status: AC
  Administered 2015-09-25: 500 mL via INTRAVENOUS

## 2015-09-25 MED ORDER — ONDANSETRON HCL 4 MG/2ML IJ SOLN: 4.0000 mg | Freq: Four times a day (QID) | INTRAMUSCULAR | Status: DC | PRN

## 2015-09-25 MED ORDER — NEPRO/CARBSTEADY PO LIQD
237.0000 mL | ORAL | Status: DC
  Administered 2015-09-26 – 2015-09-28 (×3): 237 mL via ORAL
  Filled 2015-09-25 (×3): qty 237

## 2015-09-25 MED ORDER — PIPERACILLIN-TAZOBACTAM IN DEX 2-0.25 GM/50ML IV SOLN
2.2500 g | Freq: Three times a day (TID) | INTRAVENOUS | Status: DC
  Administered 2015-09-25: 2.25 g via INTRAVENOUS
  Filled 2015-09-25 (×2): qty 50

## 2015-09-25 MED ORDER — SACCHAROMYCES BOULARDII 250 MG PO CAPS
250.0000 mg | ORAL_CAPSULE | Freq: Two times a day (BID) | ORAL | Status: DC
  Administered 2015-09-25 – 2015-09-28 (×6): 250 mg via ORAL
  Filled 2015-09-25 (×7): qty 1

## 2015-09-25 MED ORDER — DEXTROSE 5 % IV SOLN
250.0000 mg | INTRAVENOUS | Status: DC
  Administered 2015-09-25 – 2015-09-27 (×3): 250 mg via INTRAVENOUS
  Filled 2015-09-25 (×5): qty 0.25

## 2015-09-25 MED ORDER — ONDANSETRON HCL 4 MG PO TABS: 4.0000 mg | ORAL_TABLET | Freq: Four times a day (QID) | ORAL | Status: DC | PRN

## 2015-09-25 NOTE — ED Notes (Signed)
Per [facility]- Pt has renal tube changes every 6 weeks and post changes pt experiences "these HA". Pt states these HA are nothing different than before. NEG for STROKE. Pt states she had right kidney tube changes here at [facility]. Blood in tubing and bag is normal. No complaints other than Headache.

## 2015-09-25 NOTE — ED Provider Notes (Signed)
CSN: [id number]     Arrival date & time 09/25/15  0931 History   First MD Initiated Contact with Patient 09/25/15 0935     Chief Complaint  Patient presents with  . Headache    POST RENAL DRAINAGE TUBE CHANGE. FREQUENT HX WITH PROCEDURE  . Fever     (Consider location/radiation/quality/duration/timing/severity/associated sxs/prior Treatment) HPI Comments: Patient with history of chronic kidney disease, right-sided nephrostomy tube, frequent episodes of sepsis due to pyelonephritis -- presents with acute onset of generalized weakness, confusion, fever, headache. Patient had her nephrostomy tube changed yesterday as part of her routine six-week change. It is not unusual for her to have headache and bleeding in the urine after tube change. Family was concerned today because she has developed confusion and weakness which is suggestive of infection given past symptoms. She otherwise denies abdominal pain, cough, chest pain or shortness of breath. No vomiting or diarrhea. Patient started prophylactic Ciprofloxacin 250 mg 2 days prior to tube change, she has not had antibiotics today.  Patient is a 70 y.o. female presenting with headaches and fever. The history is provided by the patient.  Headache Associated symptoms: fever   Associated symptoms: no abdominal pain, no cough, no diarrhea, no myalgias, no nausea, no sore throat and no vomiting   Fever Associated symptoms: confusion and headaches   Associated symptoms: no chest pain, no cough, no diarrhea, no dysuria, no myalgias, no nausea, no rash, no rhinorrhea, no sore throat and no vomiting     Past Medical History  Diagnosis Date  . GERD (gastroesophageal reflux disease)   . Glaucoma   . H/O renal calculi 2002 & 2006  . H/O hiatal hernia   . Headache(784.0)     migraine  . Pneumonia     dx 10-06-2014 per CXR--  on 10-27-2014 pt states finished antibiotic and denies cough or fever  . Hyperlipidemia   . History of MI (myocardial  infarction)     10/ 2013 in setting of Septic Shock  . History of atrial fibrillation     10/ 2013  in setting of Septic Shock  . History of CHF (congestive heart failure)     10/ 2013 in setting of septic shock  . Sigmoid diverticulosis   . Nephrolithiasis     bilateral  . CKD (chronic kidney disease), stage III     nephrologist-  dr Florene Glen  . Right ureteral stone   . History of acute respiratory failure     10/ 2013  -- ventilated in setting septic shock  . Dysrhythmia     Afib in 2013 when she had septic shock related to stone ureteral obstruction  . Myocardial infarction [facility])     Was reported in 2013 during hospitalization with septic shock  . Septic shock ([place]) 04/04/2012  . Sepsis ([place])   . Hypertension     medication removed from regimen due to low blood pressure   . Peripheral vascular disease ([place])   . S/P hemodialysis catheter insertion ([place]) 04/11/2015     right anterior chest , only used once   . History of nephrostomy 04/11/2015     currently inplace 04/28/2015   . History of blood transfusion 04/13/2015   . Low iron     hx  . Glaucoma   . Complication of anesthesia     use a little anesthesia , per patient MD states she quit breathing (2016)  . History of blood product transfusion   . Coronary artery disease  Past Surgical History  Procedure Laterality Date  . Cystoscopy w/ ureteral stent placement  04/04/2012    Procedure: CYSTOSCOPY WITH RETROGRADE PYELOGRAM/URETERAL STENT PLACEMENT;  Surgeon: Ailene Rud, MD;  Location: [facility];  Service: Urology;  Laterality: Left;  . Total abdominal hysterectomy w/ bilateral salpingoophorectomy  1993    secondary to fibroids  . Breast biopsy Left 08/23/07    benign fibrocystic with duct ectasia  . Cardiac catheterization  07-11-2012  dr Irish Lack    Abnormal stress test/   normal coronary arteries/  LVEDP  53mmHg  . Cardiovascular stress test  06-26-2012  dr Irish Lack    marked ischemia in the basal anterior, mid  anterior, apical inferior regions/  normal LVF, ef 63%  . Transthoracic echocardiogram  04-09-2012    normal LVF,  ef 60-65%,  mild LAE,  mild TR, trivial MR and PR  . Cataract extraction w/ intraocular lens  implant, bilateral    . Knee arthroscopy Left 02-14-2003  . Laparoscopic cholecystectomy  03-23-2005  . Extracorporeal shock wave lithotripsy  05-28-2012  &  10-08-2012  . Cystoscopy with stent placement Right 10/28/2014    Procedure: RIGHT URETERAL STENT PLACEMENT;  Surgeon: Irine Seal, MD;  Location: [facility];  Service: Urology;  Laterality: Right;  . Cystoscopy/retrograde/ureteroscopy/stone extraction with basket Right 11/21/2014    Procedure: CYSTOSCOPY/RIGHT RETROGRADE PYELOGRAM/RIGHT URETEROSCOPY/BASKET EXTRACTION/RIGHT PYELOSCOPY/LASER OF STONE/RIGHT DOUBLE J STENT;  Surgeon: Carolan Clines, MD;  Location: [place];  Service: Urology;  Laterality: Right;  . Holmium laser application Right [id number]    Procedure: HOLMIUM LASER APPLICATION;  Surgeon: Carolan Clines, MD;  Location[facility];  Service: Urology;  Laterality: Right;  . Cystoscopy with stent placement Right 02/26/2015    Procedure: CYSTOSCOPY RETROGRADE PYELOGRAM WITH STENT PLACEMENT;  Surgeon: Cleon Gustin, MD;  Location: [facility];  Service: Urology;  Laterality: Right;  . Cystoscopy w/ ureteral stent placement Bilateral 05/04/2015    Procedure: CYSTOSCOPY WITH BILATERAL RETROGRADE PYELOGRAM/ WITH INTERPRETATION, EXCHANGE OF RIGHT URETERAL STENT REPLACEMENT AND PLACEMENT LEFT URETERAL STENT PLACEMENT EXAMINATION OF VAGINA;  Surgeon: Carolan Clines, MD;  Location: [facility];  Service: Urology;  Laterality: Bilateral;  . Av fistula placement Left 06/02/2015    Procedure: BRACHIOCEPHALIC ARTERIOVENOUS (AV) FISTULA CREATION ;  Surgeon: Conrad Kenai, MD;  Location: [place];  Service: Vascular;  Laterality: Left;  . Abdominal hysterectomy    . Bascilic vein transposition  Left 07/27/2015    Procedure: FIRST STAGE BASILIC VEIN TRANSPOSITION LEFT UPPER ARM;  Surgeon: Conrad Grandview, MD;  Location[facility];  Service: Vascular;  Laterality: Left;   Family History  Problem Relation Age of Onset  . Hypertension Mother   . Cancer Mother 89    breast  . Dementia Mother   . Hypertension Brother   . Diabetes Brother   . Heart disease Brother     before age 56  . Cancer Father 29    pancreatic  . Heart failure Paternal Grandmother   . Bladder Cancer Maternal Grandfather    Social History  Substance Use Topics  . Smoking status: Never Smoker   . Smokeless tobacco: Never Used  . Alcohol Use: No   OB History    Gravida Para Term Preterm AB TAB SAB Ectopic Multiple Living   1 1 1       1      Review of Systems  Constitutional: Positive for fever.  HENT: Negative for rhinorrhea and sore throat.  Eyes: Negative for redness.  Respiratory: Negative for cough.   Cardiovascular: Negative for chest pain.  Gastrointestinal: Negative for nausea, vomiting, abdominal pain and diarrhea.  Genitourinary: Positive for hematuria. Negative for dysuria.  Musculoskeletal: Negative for myalgias.  Skin: Negative for rash.  Neurological: Positive for headaches.  Psychiatric/Behavioral: Positive for confusion.    Allergies  Vicodin; Chlorhexidine; and Percocet  Home Medications   Prior to Admission medications   Medication Sig Start Date End Date Taking? Authorizing Provider  acetaminophen (TYLENOL) 500 MG tablet Take 500 mg by mouth every 6 (six) hours as needed for moderate pain or headache.     Historical Provider, MD  bimatoprost (LUMIGAN) 0.01 % SOLN Place 1 drop into both eyes at bedtime.    Historical Provider, MD  ciprofloxacin (CIPRO) 250 MG tablet Take 250 mg by mouth 2 (two) times daily.    Historical Provider, MD  ciprofloxacin (CIPRO) 500 MG tablet Take 1 tablet (500 mg total) by mouth 2 (two) times daily. Patient not taking: Reported on 09/11/2015 08/19/15   Robbie Lis, MD  metoprolol tartrate (LOPRESSOR) 25 MG tablet TAKE 1/2 TABLET BY MOUTH TWICE DAILY Patient taking differently: Take one-half tablet (12.5 mg) by mouth twice daily. 09/02/15   Jettie Booze, MD  Nutritional Supplements (FEEDING SUPPLEMENT, NEPRO CARB STEADY,) LIQD Take 237 mLs by mouth daily. 05/07/15   Maryann Mikhail, DO  Nystatin ([place]) 100000 UNIT/GM POWD Apply 1 application topically 2 (two) times daily.  09/16/15   Historical Provider, MD  ranitidine (ZANTAC) 150 MG tablet Take 150 mg by mouth 2 (two) times daily as needed for heartburn.    Historical Provider, MD  saccharomyces boulardii (FLORASTOR) 250 MG capsule Take 1 capsule (250 mg total) by mouth 2 (two) times daily. 07/08/15   Donne Hazel, MD  sodium bicarbonate 650 MG tablet Take 1 tablet (650 mg total) by mouth 2 (two) times daily. 05/30/15   Robbie Lis, MD  sodium chloride (OCEAN) 0.65 % SOLN nasal spray Place 1 spray into both nostrils 3 (three) times daily as needed for congestion.    Historical Provider, MD  timolol (BETIMOL) 0.5 % ophthalmic solution Place 1 drop into both eyes every morning.     Historical Provider, MD   BP 113/66 mmHg  Pulse 100  Temp(Src) 100.8 F (38.2 C) (Rectal)  Resp 18  Ht 5\' 3"  (1.6 m)  Wt 80.287 kg  BMI 31.36 kg/m2  SpO2 95%  LMP 01/19/1992   Physical Exam  Constitutional: She appears well-developed and well-nourished.  HENT:  Head: Normocephalic and atraumatic.  Mouth/Throat: Oropharynx is clear and moist.  Eyes: Conjunctivae are normal. Right eye exhibits no discharge. Left eye exhibits no discharge.  Neck: Normal range of motion. Neck supple.  No meningeal signs.  Cardiovascular: Normal rate, regular rhythm and normal heart sounds.   No murmur heard. Pulmonary/Chest: Effort normal and breath sounds normal. No respiratory distress. She has no wheezes. She has no rales.  Abdominal: Soft. There is no tenderness. There is no rebound and no guarding.   Genitourinary:  Blood noted from draining right nephrostomy tube.  Musculoskeletal: She exhibits no edema or tenderness.  Neurological: She is alert.  Skin: Skin is warm and dry.  Psychiatric: She has a normal mood and affect.  Nursing note and vitals reviewed.   ED Course  Procedures (including critical care time) Labs Review Labs Reviewed  CBC WITH DIFFERENTIAL/PLATELET - Abnormal; Notable for the following:    WBC 15.5 (*)  Hemoglobin 10.8 (*)    HCT 34.7 (*)    RDW 16.6 (*)    Neutro Abs 14.5 (*)    Lymphs Abs 0.4 (*)    All other components within normal limits  BASIC METABOLIC PANEL - Abnormal; Notable for the following:    Sodium 131 (*)    CO2 15 (*)    Glucose, Bld 110 (*)    BUN 85 (*)    Creatinine, Ser 7.07 (*)    Calcium 8.8 (*)    GFR calc non Af Amer 5 (*)    GFR calc Af Amer 6 (*)    All other components within normal limits  URINALYSIS, ROUTINE W REFLEX MICROSCOPIC (NOT AT [facility]) - Abnormal; Notable for the following:    Color, Urine RED (*)    APPearance TURBID (*)    Hgb urine dipstick LARGE (*)    Protein, ur 100 (*)    Leukocytes, UA LARGE (*)    All other components within normal limits  URINE MICROSCOPIC-ADD ON - Abnormal; Notable for the following:    Squamous Epithelial / LPF 0-5 (*)    Bacteria, UA MANY (*)    All other components within normal limits  PROTIME-INR - Abnormal; Notable for the following:    Prothrombin Time 15.6 (*)    All other components within normal limits  URINE CULTURE  CULTURE, BLOOD (ROUTINE X 2)  CULTURE, BLOOD (ROUTINE X 2)  APTT  I-STAT CG4 LACTIC ACID, ED    Imaging Review Ir Nephrostomy Exchange Right  09/24/2015  INDICATION: Obstructive right hydronephrosis, chronic indwelling right nephrostomy catheter, routine exchange EXAM: IR EXCHANGE NEPHROSTOMY RIGHT COMPARISON:  08/12/2005 MEDICATIONS: None. ANESTHESIA/SEDATION: None. CONTRAST:  62mL ISOVUE-300 IOPAMIDOL (ISOVUE-300) INJECTION 61% - administered into  the collecting system(s) FLUOROSCOPY TIME:  Fluoroscopy Time: 1 minutes 48 seconds (43 mGy). COMPLICATIONS: None immediate. PROCEDURE: Informed written consent was obtained from the patient after a thorough discussion of the procedural risks, benefits and alternatives. All questions were addressed. Maximal Sterile Barrier Technique was utilized including caps, mask, sterile gowns, sterile gloves, sterile drape, hand hygiene and skin antiseptic. A timeout was performed prior to the initiation of the procedure. Under sterile conditions, the existing right nephrostomy catheter was injected to confirm position. Catheter was cut and removed over an Amplatz guidewire. New catheter was advanced with the retention loop formed in the pelvis. Position confirmed with contrast injection. Images obtained for documentation. Catheter secured with a sterile dressing and connected to gravity drainage bag. IMPRESSION: Successful fluoroscopic exchange of the right 10 [place] external nephrostomy catheter Electronically Signed   By: Jerilynn Mages.  Shick M.D.   On: 09/24/2015 16:56   I have personally reviewed and evaluated these images and lab results as part of my medical decision-making.   10:08 AM Patient seen and examined. Discussed with Dr. Winfred Leeds who will see. Will give fluids, await UA for identification of source.   Vital signs reviewed and are as follows: BP 113/66 mmHg  Pulse 100  Temp(Src) 100.8 F (38.2 C) (Rectal)  Resp 18  Ht 5\' 3"  (1.6 m)  Wt 80.287 kg  BMI 31.36 kg/m2  SpO2 95%  LMP 01/19/1992  Patient received Zosyn for UTI. Previous cultures showed pan-sensitive Pseudomonas.   Admit to hospitalist, Dr. Ree Kida. Family and patient updated and agree with plan.   MDM   Final diagnoses:  Pyelonephritis  End stage renal disease ([place])   Admit.    Carlisle Cater, PA-C 09/25/15 [place], MD 09/25/15  1622 

## 2015-09-25 NOTE — ED Notes (Signed)
WILL TRANSPORT PT TO 1524-1. AAOX3. PT IN NO APPARENT DISTRESS. THE OPPORTUNITY TO ASK QUESTIONS WAS PROVIDED.

## 2015-09-25 NOTE — ED Notes (Signed)
PT HAD DIFFICULTY OBTAINING BLOOD SAMPLES. ONLY 1 BOTTLE FOR BLOOD CULTURES WERE SENT.

## 2015-09-25 NOTE — Progress Notes (Addendum)
Paged Mikhail regarding patient's is shivering, oral temperature is 100.1, heart rate is increase, over all does look well, asked if she would like rectal temp taken and stated no, no new orders from [place], will continue to monitor patient  Neta Mends RN 6:59 PM 09-25-2015

## 2015-09-25 NOTE — ED Notes (Signed)
ED PA at bedside

## 2015-09-25 NOTE — ED Notes (Signed)
Bed: [id number] Expected date:  Expected time:  Means of arrival:  Comments: EMS-headache

## 2015-09-25 NOTE — ED Notes (Signed)
Pt c/o of fever, weakness and confusion post change in renal tube drainage. HX of sepsis. Pt placed on cipro PO.

## 2015-09-25 NOTE — ED Provider Notes (Signed)
Complains of fever and chills onset yesterday, complains of frontal headache onset gradually this morning. Typical of headaches that occur whenever she gets a fever. She's been treated with Cipro prior to the procedure yesterday She had right-sided nephrostomy tube changed yesterday. Denies cough denies nausea or vomiting denies abdominal pain On exam patient is alert appropriate ill-appearing, shivering lungs clear auscultation heart regular rate and rhythm abdomen nondistended nontender back she has urostomy tube draining blood-tinged fluid. ED ECG REPORT   Date: 09/25/2015  Rate: 90  Rhythm: normal sinus rhythm  QRS Axis: normal  Intervals: normal  ST/T Wave abnormalities: nonspecific T wave changes  Conduction Disutrbances:none  Narrative Interpretation:   Old EKG Reviewed: No significant change from 09/17/2015  I have personally reviewed the EKG tracing and agree with the computerized printout as noted.  Orlie Dakin, MD 09/25/15 1622

## 2015-09-25 NOTE — H&P (Signed)
[facility] History and Physical  [name] [id number] DOB: 07/06/1945 DOA: 09/25/2015  Referring physician: Mr. Carlisle Cater, PA EDP PCP: [name], MD  Specialists: Dr. Erling Cruz, nephrologist  Chief Complaint: Fever, confusion  HPI: [name] is a 70 y.o. female  History of chronic kidney disease, right-sided nephrostomy tube placement, hypertension, presented to the emergency department with complaints of generalized weakness and fever. Patient recently had nephrostomy tube exchanged on 09/24/2015, which occurs every 6 weeks. Patient has had history of pyelonephritis with urinary tract infection after nephrostomy tube exchange in the past. This morning, patient developed fever, confusion, generalized weakness. She denied any abdominal pain, nausea or vomiting, chest pain, shortness of breath. In the emergency department, patient was noted to have leukocytosis of 15, with UTI. Patient given zosyn in the ER. [facility] called for admission.  Review of Systems:  Constitutional: Complains of fever HEENT: Denies photophobia, eye pain, redness, hearing loss, ear pain, congestion, sore throat, rhinorrhea, sneezing, mouth sores, trouble swallowing, neck pain, neck stiffness and tinnitus.   Respiratory: Denies SOB, DOE, cough, chest tightness,  and wheezing.   Cardiovascular: Denies chest pain, palpitations and leg swelling.  Gastrointestinal: Denies nausea, vomiting, abdominal pain, diarrhea, constipation, blood in stool and abdominal distention.  Genitourinary: Denies dysuria, urgency, frequency, hematuria, flank pain and difficulty urinating.  Musculoskeletal: Denies myalgias, back pain, joint swelling, arthralgias and gait problem.  Skin: Denies pallor, rash and wound.  Neurological: Complains of headaches and mild confusion Hematological: Denies adenopathy. Easy bruising, personal or family bleeding history  Psychiatric/Behavioral: Planes of confusion  Past  Medical History  Diagnosis Date  . GERD (gastroesophageal reflux disease)   . Glaucoma   . H/O renal calculi 2002 & 2006  . H/O hiatal hernia   . Headache(784.0)     migraine  . Pneumonia     dx 10-06-2014 per CXR--  on 10-27-2014 pt states finished antibiotic and denies cough or fever  . Hyperlipidemia   . History of MI (myocardial infarction)     10/ 2013 in setting of Septic Shock  . History of atrial fibrillation     10/ 2013  in setting of Septic Shock  . History of CHF (congestive heart failure)     10/ 2013 in setting of septic shock  . Sigmoid diverticulosis   . Nephrolithiasis     bilateral  . CKD (chronic kidney disease), stage III     nephrologist-  dr Florene Glen  . Right ureteral stone   . History of acute respiratory failure     10/ 2013  -- ventilated in setting septic shock  . Dysrhythmia     Afib in 2013 when she had septic shock related to stone ureteral obstruction  . Myocardial infarction [facility])     Was reported in 2013 during hospitalization with septic shock  . Septic shock ([place]) 04/04/2012  . Sepsis ([place])   . Hypertension     medication removed from regimen due to low blood pressure   . Peripheral vascular disease ([place])   . S/P hemodialysis catheter insertion ([place]) 04/11/2015     right anterior chest , only used once   . History of nephrostomy 04/11/2015     currently inplace 04/28/2015   . History of blood transfusion 04/13/2015   . Low iron     hx  . Glaucoma   . Complication of anesthesia     use a little anesthesia , per patient MD states she quit breathing (2016)  .  History of blood product transfusion   . Coronary artery disease    Past Surgical History  Procedure Laterality Date  . Cystoscopy w/ ureteral stent placement  04/04/2012    Procedure: CYSTOSCOPY WITH RETROGRADE PYELOGRAM/URETERAL STENT PLACEMENT;  Surgeon: Ailene Rud, MD;  Location: [place];  Service: Urology;  Laterality: Left;  . Total abdominal hysterectomy w/ bilateral  salpingoophorectomy  1993    secondary to fibroids  . Breast biopsy Left 08/23/07    benign fibrocystic with duct ectasia  . Cardiac catheterization  07-11-2012  dr Irish Lack    Abnormal stress test/   normal coronary arteries/  LVEDP  76mmHg  . Cardiovascular stress test  06-26-2012  dr Irish Lack    marked ischemia in the basal anterior, mid anterior, apical inferior regions/  normal LVF, ef 63%  . Transthoracic echocardiogram  04-09-2012    normal LVF,  ef 60-65%,  mild LAE,  mild TR, trivial MR and PR  . Cataract extraction w/ intraocular lens  implant, bilateral    . Knee arthroscopy Left 02-14-2003  . Laparoscopic cholecystectomy  03-23-2005  . Extracorporeal shock wave lithotripsy  05-28-2012  &  10-08-2012  . Cystoscopy with stent placement Right 10/28/2014    Procedure: RIGHT URETERAL STENT PLACEMENT;  Surgeon: Irine Seal, MD;  Location: [facility];  Service: Urology;  Laterality: Right;  . Cystoscopy/retrograde/ureteroscopy/stone extraction with basket Right 11/21/2014    Procedure: CYSTOSCOPY/RIGHT RETROGRADE PYELOGRAM/RIGHT URETEROSCOPY/BASKET EXTRACTION/RIGHT PYELOSCOPY/LASER OF STONE/RIGHT DOUBLE J STENT;  Surgeon: Carolan Clines, MD;  Location: [place] [facility];  Service: Urology;  Laterality: Right;  . Holmium laser application Right [id number]    Procedure: HOLMIUM LASER APPLICATION;  Surgeon: Carolan Clines, MD;  Location: [facility];  Service: Urology;  Laterality: Right;  . Cystoscopy with stent placement Right 02/26/2015    Procedure: CYSTOSCOPY RETROGRADE PYELOGRAM WITH STENT PLACEMENT;  Surgeon: Cleon Gustin, MD;  Location: [facility];  Service: Urology;  Laterality: Right;  . Cystoscopy w/ ureteral stent placement Bilateral 05/04/2015    Procedure: CYSTOSCOPY WITH BILATERAL RETROGRADE PYELOGRAM/ WITH INTERPRETATION, EXCHANGE OF RIGHT URETERAL STENT REPLACEMENT AND PLACEMENT LEFT URETERAL STENT PLACEMENT EXAMINATION OF VAGINA;   Surgeon: Carolan Clines, MD;  Location: [facility];  Service: Urology;  Laterality: Bilateral;  . Av fistula placement Left 06/02/2015    Procedure: BRACHIOCEPHALIC ARTERIOVENOUS (AV) FISTULA CREATION ;  Surgeon: Conrad Arbyrd, MD;  Location: [place];  Service: Vascular;  Laterality: Left;  . Abdominal hysterectomy    . Bascilic vein transposition Left 07/27/2015    Procedure: FIRST STAGE BASILIC VEIN TRANSPOSITION LEFT UPPER ARM;  Surgeon: Conrad Marengo, MD;  Location: [place];  Service: Vascular;  Laterality: Left;   Social History:  reports that she has never smoked. She has never used smokeless tobacco. She reports that she does not drink alcohol or use illicit drugs.   Allergies  Allergen Reactions  . Vicodin [Hydrocodone-Acetaminophen] Nausea And Vomiting  . Chlorhexidine Rash    Sunburn    rash  . Percocet [Oxycodone-Acetaminophen] Nausea And Vomiting    Family History  Problem Relation Age of Onset  . Hypertension Mother   . Cancer Mother 84    breast  . Dementia Mother   . Hypertension Brother   . Diabetes Brother   . Heart disease Brother     before age 62  . Cancer Father 57    pancreatic  . Heart failure Paternal Grandmother   . Bladder Cancer Maternal Grandfather  Prior to Admission medications   Medication Sig Start Date End Date Taking? Authorizing Provider  acetaminophen (TYLENOL) 500 MG tablet Take 500 mg by mouth every 6 (six) hours as needed for moderate pain or headache.    Yes Historical Provider, MD  bimatoprost (LUMIGAN) 0.01 % SOLN Place 1 drop into both eyes at bedtime.   Yes Historical Provider, MD  ciprofloxacin (CIPRO) 250 MG tablet Take 250 mg by mouth 2 (two) times daily.   Yes Historical Provider, MD  metoprolol tartrate (LOPRESSOR) 25 MG tablet TAKE 1/2 TABLET BY MOUTH TWICE DAILY Patient taking differently: Take one-half tablet (12.5 mg) by mouth twice daily. 09/02/15  Yes Jettie Booze, MD  Nutritional Supplements (FEEDING SUPPLEMENT, NEPRO  CARB STEADY,) LIQD Take 237 mLs by mouth daily. 05/07/15  Yes Caelen Reierson, DO  Nystatin ([place]) 100000 UNIT/GM POWD Apply 1 application topically 2 (two) times daily.  09/16/15  Yes Historical Provider, MD  ranitidine (ZANTAC) 150 MG tablet Take 150 mg by mouth 2 (two) times daily as needed for heartburn.   Yes Historical Provider, MD  saccharomyces boulardii (FLORASTOR) 250 MG capsule Take 1 capsule (250 mg total) by mouth 2 (two) times daily. 07/08/15  Yes Donne Hazel, MD  sodium bicarbonate 650 MG tablet Take 1 tablet (650 mg total) by mouth 2 (two) times daily. 05/30/15  Yes Robbie Lis, MD  sodium chloride (OCEAN) 0.65 % SOLN nasal spray Place 1 spray into both nostrils 3 (three) times daily as needed for congestion.   Yes Historical Provider, MD  timolol (BETIMOL) 0.5 % ophthalmic solution Place 1 drop into both eyes every morning.    Yes Historical Provider, MD  ciprofloxacin (CIPRO) 500 MG tablet Take 1 tablet (500 mg total) by mouth 2 (two) times daily. 08/19/15   Robbie Lis, MD   Physical Exam: Filed Vitals:   09/25/15 1432 09/25/15 1501  BP: 112/50 133/55  Pulse: 95 97  Temp: 102.9 F (39.4 C) 99 F (37.2 C)  Resp: 20 18     General: Well developed, well nourished, NAD, appears stated age  HEENT: NCAT, PERRLA, EOMI, Anicteic Sclera, mucous membranes dry   Neck: Supple, no JVD, no masses  Cardiovascular: S1 S2 auscultated, no rubs, murmurs or gallops. Regular rate and rhythm.  Respiratory: Clear to auscultation bilaterally with equal chest rise  Abdomen: Soft, nontender, nondistended, + bowel sounds  GU: nephrostomy tube, with blood tinged drainage  Extremities: warm dry without cyanosis clubbing or edema  Neuro: AAOx3, cranial nerves grossly intact. Strength 5/5 in patient's upper and lower extremities bilaterally  Skin: Without rashes exudates or nodules  Psych: Normal affect and demeanor with intact judgement and insight  Labs on Admission:  Basic  Metabolic Panel:  Recent Labs Lab 09/25/15 1037  NA 131*  K 5.0  CL 104  CO2 15*  GLUCOSE 110*  BUN 85*  CREATININE 7.07*  CALCIUM 8.8*   Liver Function Tests: No results for input(s): AST, ALT, ALKPHOS, BILITOT, PROT, ALBUMIN in the last 168 hours. No results for input(s): LIPASE, AMYLASE in the last 168 hours. No results for input(s): AMMONIA in the last 168 hours. CBC:  Recent Labs Lab 09/25/15 1037  WBC 15.5*  NEUTROABS 14.5*  HGB 10.8*  HCT 34.7*  MCV 84.2  PLT 312   Cardiac Enzymes: No results for input(s): CKTOTAL, CKMB, CKMBINDEX, TROPONINI in the last 168 hours.  BNP (last 3 results)  Recent Labs  02/26/15 0530 05/23/15 0602 07/04/15 1014  BNP  35.3 37.2 183.0*    ProBNP (last 3 results) No results for input(s): PROBNP in the last 8760 hours.  CBG: No results for input(s): GLUCAP in the last 168 hours.  Radiological Exams on Admission: Ir Nephrostomy Exchange Right  09/24/2015  INDICATION: Obstructive right hydronephrosis, chronic indwelling right nephrostomy catheter, routine exchange EXAM: IR EXCHANGE NEPHROSTOMY RIGHT COMPARISON:  08/12/2005 MEDICATIONS: None. ANESTHESIA/SEDATION: None. CONTRAST:  64mL ISOVUE-300 IOPAMIDOL (ISOVUE-300) INJECTION 61% - administered into the collecting system(s) FLUOROSCOPY TIME:  Fluoroscopy Time: 1 minutes 48 seconds (43 mGy). COMPLICATIONS: None immediate. PROCEDURE: Informed written consent was obtained from the patient after a thorough discussion of the procedural risks, benefits and alternatives. All questions were addressed. Maximal Sterile Barrier Technique was utilized including caps, mask, sterile gowns, sterile gloves, sterile drape, hand hygiene and skin antiseptic. A timeout was performed prior to the initiation of the procedure. Under sterile conditions, the existing right nephrostomy catheter was injected to confirm position. Catheter was cut and removed over an Amplatz guidewire. New catheter was advanced  with the retention loop formed in the pelvis. Position confirmed with contrast injection. Images obtained for documentation. Catheter secured with a sterile dressing and connected to gravity drainage bag. IMPRESSION: Successful fluoroscopic exchange of the right 10 [place] external nephrostomy catheter Electronically Signed   By: Jerilynn Mages.  Shick M.D.   On: 09/24/2015 16:56    EKG: Independently reviewed. Sinus rhythm, rate 89  Assessment/Plan Severe sepsis secondary to UTI/?pyelonephritis -Upon admission, patient was febrile, with leukocytosis and tachycardia -Patient also presented with acute on chronic kidney injury as well as mild confusion -UA showed the PBC TNTC, many bacteria, TNTC RBC, large leukocytes -Patient has had Pseudomonas in the past, pansensitive. Will place patient on cefepime. She did receive 1 dose of Zosyn in the emergency department. -Blood cultures and urine cultures pending -Will place on IV fluids  Right percutaneous nephrostomy -Recent exchange on 09/24/2015 -Followed by interventional radiology  Hematuria -Likely secondary to nephrostomy tube exchange -Continue to monitor hemoglobin closely  Acute on Chronic kidney disease, stage 5 -Creatinine upon admission 7 -Continue sodium bicarbonate -Patient does have partial access in place -Spoke with nephrology, Dr.Schertz.  Patient currently not uremic and making urine, no need for HD at this time.  Recommended IVF and monitor BMP  Essential hypertension -Will hold metoprolol as patient's blood pressure has been soft  Anemia of chronic disease -Hemoglobin currently 10.8, continue to monitor CBC  Generalized weakness -Likely secondary to sepsis with pyelonephritis -Continue to monitor  DVT prophylaxis: SCD  Code Status: Full  Condition: Guarded   Family Communication: Family at bedside. Admission, patients condition and plan of care including tests being ordered have been discussed with the patient and family  who indicate understanding and agree with the plan and Code Status.  Disposition Plan: Admitted.   Time spent: 65 minutes  Maye Parkinson D.O. [facility] Pager [phone]  If 7PM-7AM, please contact night-coverage [email or web address] Password [facility] 09/25/2015, 3:42 PM

## 2015-09-25 NOTE — ED Notes (Signed)
Nurse collecting labs. 

## 2015-09-25 NOTE — Progress Notes (Signed)
Pharmacy Antibiotic Note  [name] is a 70 y.o. female admitted on 09/25/2015 with fever and sepsis d/t suspected pyelonephritis following routine R PCN exchange yesterday. Started on cipro x 2 days prior to exchange. Pharmacy has been consulted for cefepime dosing.  CrCl < 20 ml/min at baseline.  Plan: Cefepime 250mg  IV q24h Follow renal function   Height: 5\' 3"  (160 cm) Weight: 177 lb (80.287 kg) IBW/kg (Calculated) : 52.4  Temp (24hrs), Avg:100.6 F (38.1 C), Min:98.6 F (37 C), Max:102.9 F (39.4 C)   Recent Labs Lab 09/25/15 1037 09/25/15 1045  WBC 15.5*  --   CREATININE 7.07*  --   LATICACIDVEN  --  1.13    Estimated Creatinine Clearance: 7.4 mL/min (by C-G formula based on Cr of 7.07).    Allergies  Allergen Reactions  . Vicodin [Hydrocodone-Acetaminophen] Nausea And Vomiting  . Chlorhexidine Rash    Sunburn    rash  . Percocet [Oxycodone-Acetaminophen] Nausea And Vomiting    Antimicrobials this admission: Zosyn x1 Cefepime 4/7  Dose adjustments this admission: ---  Microbiology results: Recent MSSA, pseudomonas (pan-sens) in urine 4/7 BCx: sent 4/7 UCx: sent    Thank you for allowing pharmacy to be a part of this patient's care.  Dolly Rias RPh 09/25/2015, 3:45 PM Pager [phone]

## 2015-09-25 NOTE — Progress Notes (Signed)
Pharmacy Antibiotic Note  [name] is a 70 y.o. female admitted on 09/25/2015 with fever and sepsis d/t suspected pyelonephritis following routine R PCN exchange yesterday. Started on cipro x 2 days prior to exchange. Pharmacy has been consulted for Zosyn dosing.  CrCl < 20 ml/min at baseline.  Plan: Zosyn 2.25 g IV q8 hr   Height: 5\' 3"  (160 cm) Weight: 177 lb (80.287 kg) IBW/kg (Calculated) : 52.4  Temp (24hrs), Avg:99.7 F (37.6 C), Min:98.6 F (37 C), Max:100.8 F (38.2 C)   Recent Labs Lab 09/25/15 1037 09/25/15 1045  WBC 15.5*  --   CREATININE 7.07*  --   LATICACIDVEN  --  1.13    Estimated Creatinine Clearance: 7.4 mL/min (by C-G formula based on Cr of 7.07).    Allergies  Allergen Reactions  . Vicodin [Hydrocodone-Acetaminophen] Nausea And Vomiting  . Chlorhexidine Rash    Sunburn    rash  . Percocet [Oxycodone-Acetaminophen] Nausea And Vomiting    Antimicrobials this admission: Zosyn 4/7 >>   Dose adjustments this admission: ---  Microbiology results: Recent MSSA, pseudomonas (pan-sens) in urine 4/7 BCx: sent 4/7 UCx: sent    Thank you for allowing pharmacy to be a part of this patient's care.  Reuel Boom, PharmD, BCPS Pager: [phone] 09/25/2015, 11:30 AM

## 2015-09-26 DIAGNOSIS — N185 Chronic kidney disease, stage 5: Secondary | ICD-10-CM

## 2015-09-26 LAB — BASIC METABOLIC PANEL
Anion gap: 12 (ref 5–15)
BUN: 76 mg/dL — ABNORMAL HIGH (ref 6–20)
CO2: 14 mmol/L — ABNORMAL LOW (ref 22–32)
Calcium: 8.2 mg/dL — ABNORMAL LOW (ref 8.9–10.3)
Chloride: 108 mmol/L (ref 101–111)
Creatinine, Ser: 6.87 mg/dL — ABNORMAL HIGH (ref 0.44–1.00)
GFR calc Af Amer: 6 mL/min — ABNORMAL LOW (ref >60)
GFR calc non Af Amer: 5 mL/min — ABNORMAL LOW (ref >60)
Glucose, Bld: 94 mg/dL (ref 65–99)
Potassium: 5 mmol/L (ref 3.5–5.1)
Sodium: 134 mmol/L — ABNORMAL LOW (ref 135–145)

## 2015-09-26 LAB — CBC
HCT: 28.9 % — ABNORMAL LOW (ref 36.0–46.0)
Hemoglobin: 9.6 g/dL — ABNORMAL LOW (ref 12.0–15.0)
MCH: 27.4 pg (ref 26.0–34.0)
MCHC: 33.2 g/dL (ref 30.0–36.0)
MCV: 82.6 fL (ref 78.0–100.0)
Platelets: 264 10*3/uL (ref 150–400)
RBC: 3.5 MIL/uL — ABNORMAL LOW (ref 3.87–5.11)
RDW: 16.7 % — ABNORMAL HIGH (ref 11.5–15.5)
WBC: 12.9 10*3/uL — ABNORMAL HIGH (ref 4.0–10.5)

## 2015-09-26 LAB — MRSA PCR SCREENING: MRSA by PCR: POSITIVE — AB

## 2015-09-26 MED ORDER — MUPIROCIN 2 % EX OINT
1.0000 | TOPICAL_OINTMENT | Freq: Two times a day (BID) | CUTANEOUS | Status: DC
Start: 2015-09-26 — End: 2015-09-28
  Administered 2015-09-26 – 2015-09-28 (×5): 1 via NASAL
  Filled 2015-09-26: qty 22

## 2015-09-26 MED ORDER — FAMOTIDINE 20 MG PO TABS
20.0000 mg | ORAL_TABLET | Freq: Every day | ORAL | Status: DC
  Administered 2015-09-26 – 2015-09-28 (×3): 20 mg via ORAL
  Filled 2015-09-26 (×3): qty 1

## 2015-09-26 NOTE — Progress Notes (Signed)
PROGRESS NOTE  [name] [id number] DOB: 1945/06/30 DOA: 09/25/2015 PCP: [name], MD Outpatient Specialists:    LOS: 1 day   Brief Narrative: 70 y.o. female with history of chronic kidney disease, right-sided nephrostomy tube placement, hypertension, presented to the emergency department with complaints of generalized weakness and fever  Assessment & Plan: Active Problems:   Essential hypertension, benign   Sepsis secondary to UTI ([place])   Pyelonephritis   Sepsis ([place])   CKD (chronic kidney disease) stage 5, GFR less than 15 ml/min (HCC)   Severe sepsis secondary to UTI/?pyelonephritis - Upon admission, patient was febrile, with leukocytosis and tachycardia - Patient also presented with acute on chronic kidney injury as well as mild confusion likely in the setting of sepsis - She is improving today, however still febrile. Mental status is back to baseline. - Patient has had Pseudomonas in the past, pansensitive. Will place patient on cefepime. She did receive 1 dose of Zosyn in the emergency department. - cultures pending  Right percutaneous nephrostomy - Recent exchange on 09/24/2015 likely triggering #1 - Followed by interventional radiology  Hematuria - Likely secondary to nephrostomy tube exchange - Continue to monitor hemoglobin closely - last time it took 5 days to clear  Acute on Chronic kidney disease, stage 5 - Creatinine upon admission 7, 6.9 today - Continue sodium bicarbonate - Patient does have partial access in place - Admitting MD spoke with nephrology, Dr.Schertz. Patient currently not uremic and making urine, no need for HD at this time. Recommended IVF and monitor BMP  Essential hypertension - Will hold metoprolol as patient's blood pressure has been soft  Anemia of chronic disease - Hemoglobin stable, monitor  Generalized weakness - Likely secondary to sepsis with pyelonephritis - Continue to monitor   DVT prophylaxis:  SCD Code Status: Full Family Communication: no family bedside Disposition Plan: home when ready  Barriers for discharge: IV antibiotics  Consultants:   None   Procedures:   None   Antimicrobials:  Cefepime 4/7 >>   Subjective: - Feeling better this morning, still has a fever, denies chills, denies any chest pain, no reference of breath, no nausea or vomiting  Objective: Filed Vitals:   09/25/15 2114 09/25/15 2250 09/26/15 0507 09/26/15 1303  BP: 119/45  112/49 117/45  Pulse: 107  102 86  Temp: 101.7 F (38.7 C) 98.9 F (37.2 C) 101.7 F (38.7 C) 98.7 F (37.1 C)  TempSrc: Oral  Axillary Oral  Resp: 20  20 20   Height:      Weight:   87.363 kg (192 lb 9.6 oz)   SpO2: 96%  97% 99%    Intake/Output Summary (Last 24 hours) at 09/26/15 1310 Last data filed at 09/26/15 0958  Gross per 24 hour  Intake 1071.25 ml  Output   1425 ml  Net -353.75 ml   Filed Weights   09/25/15 0956 09/26/15 0507  Weight: 80.287 kg (177 lb) 87.363 kg (192 lb 9.6 oz)    Examination: Constitutional: NAD Filed Vitals:   09/25/15 2114 09/25/15 2250 09/26/15 0507 09/26/15 1303  BP: 119/45  112/49 117/45  Pulse: 107  102 86  Temp: 101.7 F (38.7 C) 98.9 F (37.2 C) 101.7 F (38.7 C) 98.7 F (37.1 C)  TempSrc: Oral  Axillary Oral  Resp: 20  20 20   Height:      Weight:   87.363 kg (192 lb 9.6 oz)   SpO2: 96%  97% 99%  Eyes: PERRL, lids and conjunctivae normal ENMT:  Mucous membranes are moist.  Respiratory: clear to auscultation bilaterally, no wheezing, no crackles.  Cardiovascular: Regular rate and rhythm, no murmurs / rubs / gallops. No extremity edema. 2+ pedal pulses. No carotid bruits.  Abdomen: no tenderness, no masses palpated. No hepatosplenomegaly. Bowel sounds positive.  Musculoskeletal: no clubbing / cyanosis. Nephrostomy tube in place. Neurologic: non focal  Psychiatric: Normal judgment and insight. Alert and oriented x 3. Normal mood.    Data Reviewed: I have  personally reviewed following labs and imaging studies  CBC:  Recent Labs Lab 09/25/15 1037 09/26/15 0503  WBC 15.5* 12.9*  NEUTROABS 14.5*  --   HGB 10.8* 9.6*  HCT 34.7* 28.9*  MCV 84.2 82.6  PLT 312 [id number]   Basic Metabolic Panel:  Recent Labs Lab 09/25/15 1037 09/26/15 0503  NA 131* 134*  K 5.0 5.0  CL 104 108  CO2 15* 14*  GLUCOSE 110* 94  BUN 85* 76*  CREATININE 7.07* 6.87*  CALCIUM 8.8* 8.2*   GFR: Estimated Creatinine Clearance: 8 mL/min (by C-G formula based on Cr of 6.87). Liver Function Tests: No results for input(s): AST, ALT, ALKPHOS, BILITOT, PROT, ALBUMIN in the last 168 hours. No results for input(s): LIPASE, AMYLASE in the last 168 hours. No results for input(s): AMMONIA in the last 168 hours. Coagulation Profile:  Recent Labs Lab 09/25/15 1543  INR 1.27   Cardiac Enzymes: No results for input(s): CKTOTAL, CKMB, CKMBINDEX, TROPONINI in the last 168 hours. BNP (last 3 results) No results for input(s): PROBNP in the last 8760 hours. HbA1C: No results for input(s): HGBA1C in the last 72 hours. CBG: No results for input(s): GLUCAP in the last 168 hours. Lipid Profile: No results for input(s): CHOL, HDL, LDLCALC, TRIG, CHOLHDL, LDLDIRECT in the last 72 hours. Thyroid Function Tests: No results for input(s): TSH, T4TOTAL, FREET4, T3FREE, THYROIDAB in the last 72 hours. Anemia Panel: No results for input(s): VITAMINB12, FOLATE, FERRITIN, TIBC, IRON, RETICCTPCT in the last 72 hours. Urine analysis:    Component Value Date/Time   COLORURINE RED* 09/25/2015 1146   APPEARANCEUR TURBID* 09/25/2015 1146   LABSPEC 1.014 09/25/2015 1146   PHURINE 6.0 09/25/2015 1146   GLUCOSEU NEGATIVE 09/25/2015 1146   HGBUR LARGE* 09/25/2015 1146   BILIRUBINUR NEGATIVE 09/25/2015 1146   BILIRUBINUR neg 09/18/2014 0920   KETONESUR NEGATIVE 09/25/2015 1146   PROTEINUR 100* 09/25/2015 1146   PROTEINUR 2+ 09/18/2014 0920   UROBILINOGEN 0.2 05/02/2015 2020    UROBILINOGEN negative 09/18/2014 0920   NITRITE NEGATIVE 09/25/2015 1146   NITRITE neg 09/18/2014 0920   LEUKOCYTESUR LARGE* 09/25/2015 1146   Sepsis Labs: Invalid input(s): PROCALCITONIN, LACTICIDVEN  Recent Results (from the past 240 hour(s))  Blood Culture (routine x 2)     Status: None (Preliminary result)   Collection Time: 09/25/15 10:35 AM  Result Value Ref Range Status   Specimen Description BLOOD RIGHT HAND  Final   Special Requests IN PEDIATRIC BOTTLE 1ML  Final   Culture   Final    NO GROWTH < 24 HOURS Performed at [facility]    Report Status PENDING  Incomplete  Urine culture     Status: None (Preliminary result)   Collection Time: 09/25/15 11:46 AM  Result Value Ref Range Status   Specimen Description URINE, RANDOM PED BAG  Final   Special Requests cipro  Final   Culture   Final    NO GROWTH < 24 HOURS Performed at [facility]    Report Status PENDING  Incomplete  Blood Culture (routine x 2)     Status: None (Preliminary result)   Collection Time: 09/25/15  3:12 PM  Result Value Ref Range Status   Specimen Description BLOOD RIGHT HAND  Final   Special Requests BOTTLES DRAWN AEROBIC AND ANAEROBIC 5CC  Final   Culture   Final    NO GROWTH < 24 HOURS Performed at [facility]    Report Status PENDING  Incomplete  MRSA PCR Screening     Status: Abnormal   Collection Time: 09/26/15  3:14 AM  Result Value Ref Range Status   MRSA by PCR POSITIVE (A) NEGATIVE Final    Comment:        The GeneXpert MRSA Assay (FDA approved for NASAL specimens only), is one component of a comprehensive MRSA colonization surveillance program. It is not intended to diagnose MRSA infection nor to guide or monitor treatment for MRSA infections. RESULT CALLED TO, READ BACK BY AND VERIFIED WITH: S.WALTON RN AT 0809 ON 09/26/15 BY S.VANHOORNE       Radiology Studies: Ir Nephrostomy Exchange Right  09/24/2015  INDICATION: Obstructive right hydronephrosis,  chronic indwelling right nephrostomy catheter, routine exchange EXAM: IR EXCHANGE NEPHROSTOMY RIGHT COMPARISON:  08/12/2005 MEDICATIONS: None. ANESTHESIA/SEDATION: None. CONTRAST:  75mL ISOVUE-300 IOPAMIDOL (ISOVUE-300) INJECTION 61% - administered into the collecting system(s) FLUOROSCOPY TIME:  Fluoroscopy Time: 1 minutes 48 seconds (43 mGy). COMPLICATIONS: None immediate. PROCEDURE: Informed written consent was obtained from the patient after a thorough discussion of the procedural risks, benefits and alternatives. All questions were addressed. Maximal Sterile Barrier Technique was utilized including caps, mask, sterile gowns, sterile gloves, sterile drape, hand hygiene and skin antiseptic. A timeout was performed prior to the initiation of the procedure. Under sterile conditions, the existing right nephrostomy catheter was injected to confirm position. Catheter was cut and removed over an Amplatz guidewire. New catheter was advanced with the retention loop formed in the pelvis. Position confirmed with contrast injection. Images obtained for documentation. Catheter secured with a sterile dressing and connected to gravity drainage bag. IMPRESSION: Successful fluoroscopic exchange of the right 10 [place] external nephrostomy catheter Electronically Signed   By: Jerilynn Mages.  Shick M.D.   On: 09/24/2015 16:56     Scheduled Meds: . ceFEPime (MAXIPIME) IV  250 mg Intravenous Q24H  . famotidine  20 mg Oral Daily  . feeding supplement (NEPRO CARB STEADY)  237 mL Oral Q24H  . latanoprost  1 drop Both Eyes QHS  . mupirocin ointment  1 application Nasal BID  . saccharomyces boulardii  250 mg Oral BID  . sodium bicarbonate  650 mg Oral BID  . timolol  1 drop Both Eyes q morning - 10a   Continuous Infusions: . sodium chloride 75 mL/hr at 09/26/15 0957     Marzetta Board, MD, PhD [facility] Pager [phone]  If 7PM-7AM, please contact night-coverage [email or web address] Password [email or web address] 09/26/2015, 1:10 PM

## 2015-09-27 DIAGNOSIS — A419 Sepsis, unspecified organism: Secondary | ICD-10-CM

## 2015-09-27 DIAGNOSIS — N390 Urinary tract infection, site not specified: Secondary | ICD-10-CM

## 2015-09-27 DIAGNOSIS — N12 Tubulo-interstitial nephritis, not specified as acute or chronic: Secondary | ICD-10-CM

## 2015-09-27 LAB — COMPREHENSIVE METABOLIC PANEL
ALT: 14 U/L (ref 14–54)
AST: 15 U/L (ref 15–41)
Albumin: 2.3 g/dL — ABNORMAL LOW (ref 3.5–5.0)
Alkaline Phosphatase: 61 U/L (ref 38–126)
Anion gap: 11 (ref 5–15)
BUN: 76 mg/dL — ABNORMAL HIGH (ref 6–20)
CO2: 15 mmol/L — ABNORMAL LOW (ref 22–32)
Calcium: 8.5 mg/dL — ABNORMAL LOW (ref 8.9–10.3)
Chloride: 111 mmol/L (ref 101–111)
Creatinine, Ser: 6.66 mg/dL — ABNORMAL HIGH (ref 0.44–1.00)
GFR calc Af Amer: 7 mL/min — ABNORMAL LOW (ref >60)
GFR calc non Af Amer: 6 mL/min — ABNORMAL LOW (ref >60)
Glucose, Bld: 100 mg/dL — ABNORMAL HIGH (ref 65–99)
Potassium: 4.4 mmol/L (ref 3.5–5.1)
Sodium: 137 mmol/L (ref 135–145)
Total Bilirubin: 0.2 mg/dL — ABNORMAL LOW (ref 0.3–1.2)
Total Protein: 5.5 g/dL — ABNORMAL LOW (ref 6.5–8.1)

## 2015-09-27 LAB — CBC
HCT: 29.7 % — ABNORMAL LOW (ref 36.0–46.0)
Hemoglobin: 9.5 g/dL — ABNORMAL LOW (ref 12.0–15.0)
MCH: 26.9 pg (ref 26.0–34.0)
MCHC: 32 g/dL (ref 30.0–36.0)
MCV: 84.1 fL (ref 78.0–100.0)
Platelets: 256 10*3/uL (ref 150–400)
RBC: 3.53 MIL/uL — ABNORMAL LOW (ref 3.87–5.11)
RDW: 17.3 % — ABNORMAL HIGH (ref 11.5–15.5)
WBC: 8.9 10*3/uL (ref 4.0–10.5)

## 2015-09-27 LAB — URINE CULTURE: Culture: 10000 — AB

## 2015-09-27 NOTE — Progress Notes (Signed)
PROGRESS NOTE  [name] [id number] DOB: April 19, 1946 DOA: 09/25/2015 PCP: [name], MD Outpatient Specialists:    LOS: 2 days   Brief Narrative: 70 y.o. female with history of chronic kidney disease, right-sided nephrostomy tube placement, hypertension, presented to the emergency department with complaints of generalized weakness and fever  Assessment & Plan: Active Problems:   Essential hypertension, benign   Sepsis secondary to UTI ([place])   Pyelonephritis   Sepsis ([place])   CKD (chronic kidney disease) stage 5, GFR less than 15 ml/min (HCC)   Severe sepsis secondary to UTI/?pyelonephritis - Upon admission, patient was febrile, with leukocytosis and tachycardia - Patient also presented with acute on chronic kidney injury as well as mild confusion likely in the setting of sepsis - She is improving today, however still febrile. Mental status is back to baseline. - Patient has had Pseudomonas in the past, pansensitive. Will place patient on cefepime. She did receive 1 dose of Zosyn in the emergency department. - cultures pending  Right percutaneous nephrostomy - Recent exchange on 09/24/2015 likely triggering #1, recurrent admission with sepsis after tube exchange - Followed by interventional radiology  Hematuria - Likely secondary to nephrostomy tube exchange - Continue to monitor hemoglobin closely - last time it took 5 days to clear  Acute on Chronic kidney disease, stage 5 - Creatinine upon admission 7, 6.9 today - Continue sodium bicarbonate - Patient does have partial access in place - Admitting MD spoke with nephrology, Dr.Schertz.Patient currently not uremic and making urine, no need for HD at this time. Recommended IVF and monitor BMP  Essential hypertension - Will hold metoprolol as patient's blood pressure has been soft  Anemia of chronic disease - Hemoglobin stable, monitor  Generalized weakness - Likely secondary to sepsis with  pyelonephritis - Continue to monitor   DVT prophylaxis: SCD Code Status: Full Family Communication: d/w daughter bedside Disposition Plan: home when ready  Barriers for discharge: IV antibiotics  Consultants:   None   Procedures:   None   Antimicrobials:  Cefepime 4/7 >>   Subjective: - Feeling better this morning, still has a fever, denies chills, denies any chest pain, no reference of breath, no nausea or vomiting  Objective: Filed Vitals:   09/26/15 0507 09/26/15 1303 09/26/15 2152 09/27/15 0511  BP: 112/49 117/45 119/45 106/46  Pulse: 102 86 76 81  Temp: 101.7 F (38.7 C) 98.7 F (37.1 C) 98.1 F (36.7 C) 97.8 F (36.6 C)  TempSrc: Axillary Oral Oral Oral  Resp: 20 20 18 18   Height:      Weight: 87.363 kg (192 lb 9.6 oz)   90.039 kg (198 lb 8 oz)  SpO2: 97% 99% 98% 97%    Intake/Output Summary (Last 24 hours) at 09/27/15 1204 Last data filed at 09/27/15 1153  Gross per 24 hour  Intake   1370 ml  Output   1625 ml  Net   -255 ml   Filed Weights   09/25/15 0956 09/26/15 0507 09/27/15 0511  Weight: 80.287 kg (177 lb) 87.363 kg (192 lb 9.6 oz) 90.039 kg (198 lb 8 oz)    Examination: Constitutional: NAD Filed Vitals:   09/26/15 0507 09/26/15 1303 09/26/15 2152 09/27/15 0511  BP: 112/49 117/45 119/45 106/46  Pulse: 102 86 76 81  Temp: 101.7 F (38.7 C) 98.7 F (37.1 C) 98.1 F (36.7 C) 97.8 F (36.6 C)  TempSrc: Axillary Oral Oral Oral  Resp: 20 20 18 18   Height:      Weight:  87.363 kg (192 lb 9.6 oz)   90.039 kg (198 lb 8 oz)  SpO2: 97% 99% 98% 97%  Eyes: PERRL, lids and conjunctivae normal ENMT: Mucous membranes are moist.  Respiratory: clear to auscultation bilaterally, no wheezing, no crackles.  Cardiovascular: Regular rate and rhythm, no murmurs / rubs / gallops. No extremity edema. 2+ pedal pulses. No carotid bruits.  Abdomen: no tenderness, no masses palpated. No hepatosplenomegaly. Bowel sounds positive.  Musculoskeletal: no clubbing /  cyanosis. Nephrostomy tube in place. Neurologic: non focal  Psychiatric: Normal judgment and insight. Alert and oriented x 3. Normal mood.    Data Reviewed: I have personally reviewed following labs and imaging studies  CBC:  Recent Labs Lab 09/25/15 1037 09/26/15 0503 09/27/15 0407  WBC 15.5* 12.9* 8.9  NEUTROABS 14.5*  --   --   HGB 10.8* 9.6* 9.5*  HCT 34.7* 28.9* 29.7*  MCV 84.2 82.6 84.1  PLT 312 [id number]   Basic Metabolic Panel:  Recent Labs Lab 09/25/15 1037 09/26/15 0503 09/27/15 0407  NA 131* 134* 137  K 5.0 5.0 4.4  CL 104 108 111  CO2 15* 14* 15*  GLUCOSE 110* 94 100*  BUN 85* 76* 76*  CREATININE 7.07* 6.87* 6.66*  CALCIUM 8.8* 8.2* 8.5*   GFR: Estimated Creatinine Clearance: 8.4 mL/min (by C-G formula based on Cr of 6.66). Liver Function Tests:  Recent Labs Lab 09/27/15 0407  AST 15  ALT 14  ALKPHOS 61  BILITOT 0.2*  PROT 5.5*  ALBUMIN 2.3*   No results for input(s): LIPASE, AMYLASE in the last 168 hours. No results for input(s): AMMONIA in the last 168 hours. Coagulation Profile:  Recent Labs Lab 09/25/15 1543  INR 1.27   Cardiac Enzymes: No results for input(s): CKTOTAL, CKMB, CKMBINDEX, TROPONINI in the last 168 hours. BNP (last 3 results) No results for input(s): PROBNP in the last 8760 hours. HbA1C: No results for input(s): HGBA1C in the last 72 hours. CBG: No results for input(s): GLUCAP in the last 168 hours. Lipid Profile: No results for input(s): CHOL, HDL, LDLCALC, TRIG, CHOLHDL, LDLDIRECT in the last 72 hours. Thyroid Function Tests: No results for input(s): TSH, T4TOTAL, FREET4, T3FREE, THYROIDAB in the last 72 hours. Anemia Panel: No results for input(s): VITAMINB12, FOLATE, FERRITIN, TIBC, IRON, RETICCTPCT in the last 72 hours. Urine analysis:    Component Value Date/Time   COLORURINE RED* 09/25/2015 1146   APPEARANCEUR TURBID* 09/25/2015 1146   LABSPEC 1.014 09/25/2015 1146   PHURINE 6.0 09/25/2015 1146    GLUCOSEU NEGATIVE 09/25/2015 1146   HGBUR LARGE* 09/25/2015 1146   BILIRUBINUR NEGATIVE 09/25/2015 1146   BILIRUBINUR neg 09/18/2014 0920   KETONESUR NEGATIVE 09/25/2015 1146   PROTEINUR 100* 09/25/2015 1146   PROTEINUR 2+ 09/18/2014 0920   UROBILINOGEN 0.2 05/02/2015 2020   UROBILINOGEN negative 09/18/2014 0920   NITRITE NEGATIVE 09/25/2015 1146   NITRITE neg 09/18/2014 0920   LEUKOCYTESUR LARGE* 09/25/2015 1146   Sepsis Labs: Invalid input(s): PROCALCITONIN, LACTICIDVEN  Recent Results (from the past 240 hour(s))  Blood Culture (routine x 2)     Status: None (Preliminary result)   Collection Time: 09/25/15 10:35 AM  Result Value Ref Range Status   Specimen Description BLOOD RIGHT HAND  Final   Special Requests IN PEDIATRIC BOTTLE 1ML  Final   Culture   Final    NO GROWTH 1 DAY Performed at [facility]    Report Status PENDING  Incomplete  Urine culture     Status: None (  Preliminary result)   Collection Time: 09/25/15 11:46 AM  Result Value Ref Range Status   Specimen Description URINE, RANDOM PED BAG  Final   Special Requests cipro  Final   Culture   Final    NO GROWTH < 24 HOURS Performed at [facility]    Report Status PENDING  Incomplete  Blood Culture (routine x 2)     Status: None (Preliminary result)   Collection Time: 09/25/15  3:12 PM  Result Value Ref Range Status   Specimen Description BLOOD RIGHT HAND  Final   Special Requests BOTTLES DRAWN AEROBIC AND ANAEROBIC 5CC  Final   Culture   Final    NO GROWTH < 24 HOURS Performed at [facility]    Report Status PENDING  Incomplete  MRSA PCR Screening     Status: Abnormal   Collection Time: 09/26/15  3:14 AM  Result Value Ref Range Status   MRSA by PCR POSITIVE (A) NEGATIVE Final    Comment:        The GeneXpert MRSA Assay (FDA approved for NASAL specimens only), is one component of a comprehensive MRSA colonization surveillance program. It is not intended to diagnose  MRSA infection nor to guide or monitor treatment for MRSA infections. RESULT CALLED TO, READ BACK BY AND VERIFIED WITH: S.WALTON RN AT 0809 ON 09/26/15 BY S.VANHOORNE      Radiology Studies: No results found.  Scheduled Meds: . ceFEPime (MAXIPIME) IV  250 mg Intravenous Q24H  . famotidine  20 mg Oral Daily  . feeding supplement (NEPRO CARB STEADY)  237 mL Oral Q24H  . latanoprost  1 drop Both Eyes QHS  . mupirocin ointment  1 application Nasal BID  . saccharomyces boulardii  250 mg Oral BID  . sodium bicarbonate  650 mg Oral BID  . timolol  1 drop Both Eyes q morning - 10a   Continuous Infusions: . sodium chloride 75 mL/hr (09/27/15 1130)   Time spent: 25 minutes, more than 50% bedside discussing with patient and her daughter   Marzetta Board, MD, PhD [facility] Pager [phone]  If 7PM-7AM, please contact night-coverage [email or web address] Password [email or web address] 09/27/2015, 12:04 PM

## 2015-09-28 LAB — COMPREHENSIVE METABOLIC PANEL
ALT: 13 U/L — ABNORMAL LOW (ref 14–54)
AST: 9 U/L — ABNORMAL LOW (ref 15–41)
Albumin: 2.3 g/dL — ABNORMAL LOW (ref 3.5–5.0)
Alkaline Phosphatase: 57 U/L (ref 38–126)
Anion gap: 10 (ref 5–15)
BUN: 69 mg/dL — ABNORMAL HIGH (ref 6–20)
CO2: 16 mmol/L — ABNORMAL LOW (ref 22–32)
Calcium: 8.2 mg/dL — ABNORMAL LOW (ref 8.9–10.3)
Chloride: 114 mmol/L — ABNORMAL HIGH (ref 101–111)
Creatinine, Ser: 6.4 mg/dL — ABNORMAL HIGH (ref 0.44–1.00)
GFR calc Af Amer: 7 mL/min — ABNORMAL LOW (ref >60)
GFR calc non Af Amer: 6 mL/min — ABNORMAL LOW (ref >60)
Glucose, Bld: 98 mg/dL (ref 65–99)
Potassium: 4.1 mmol/L (ref 3.5–5.1)
Sodium: 140 mmol/L (ref 135–145)
Total Bilirubin: 0.3 mg/dL (ref 0.3–1.2)
Total Protein: 5.5 g/dL — ABNORMAL LOW (ref 6.5–8.1)

## 2015-09-28 LAB — CBC
HCT: 26.2 % — ABNORMAL LOW (ref 36.0–46.0)
Hemoglobin: 8.5 g/dL — ABNORMAL LOW (ref 12.0–15.0)
MCH: 26.6 pg (ref 26.0–34.0)
MCHC: 32.4 g/dL (ref 30.0–36.0)
MCV: 82.1 fL (ref 78.0–100.0)
Platelets: 279 10*3/uL (ref 150–400)
RBC: 3.19 MIL/uL — ABNORMAL LOW (ref 3.87–5.11)
RDW: 16.9 % — ABNORMAL HIGH (ref 11.5–15.5)
WBC: 12.1 10*3/uL — ABNORMAL HIGH (ref 4.0–10.5)

## 2015-09-28 MED ORDER — CIPROFLOXACIN HCL 250 MG PO TABS: 250.0000 mg | ORAL_TABLET | Freq: Every day | ORAL | Status: DC

## 2015-09-28 NOTE — Care Management Important Message (Signed)
Important Message  Patient Details IM Letter given to Suzanne/Case Manager to present to Patient Name: [name] MRN: [id number] Date of Birth: 01-30-1946   Medicare Important Message Given:  Yes    Camillo Flaming 09/28/2015, 11:20 AMImportant Message  Patient Details  Name: [name] MRN: [id number] Date of Birth: 24-Mar-1946   Medicare Important Message Given:  Yes    Camillo Flaming 09/28/2015, 11:20 AM

## 2015-09-28 NOTE — Discharge Summary (Signed)
Physician Discharge Summary  [name] [id number] DOB: [birth year] DOA: 09/25/2015  PCP: Amy Argyle, MD  Admit date: 09/25/2015 Discharge date: 09/28/2015  Time spent: > 30 minutes  Recommendations for Outpatient Follow-up:  1. Follow up with Dr. Gaynelle Reilly in 1-2 weeks 2. Follow up with Dr. Florene Reilly in 1 week as scheduled   Discharge Diagnoses:  Active Problems:   Essential hypertension, benign   Sepsis secondary to UTI ([name])   Pyelonephritis   Sepsis ([name])   CKD (chronic kidney disease) stage 5, GFR less than 15 ml/min [facility])  Discharge Condition: stable  Diet recommendation: renal   Filed Weights   09/26/15 0507 09/27/15 0511 09/28/15 0540  Weight: 87.363 kg (192 lb 9.6 oz) 90.039 kg (198 lb 8 oz) 88.3 kg (194 lb 10.7 oz)    History of present illness:  See H&P, Labs, Consult and Test reports for all details in brief, patient is a 70 y.o. female with history of chronic kidney disease, right-sided nephrostomy tube placement, hypertension, presented to the emergency department with complaints of generalized weakness and fever  Hospital Course:  Severe sepsis secondary to UTI/pyelonephritis - Upon admission, patient was febrile, with leukocytosis and tachycardia, Patient also presented with acute on chronic kidney injury as well as mild confusion likely in the setting of sepsis. She was started on broad spectrum antibiotics with Cefepime with clinical improvement, on the day of discharge she was back to baseline, afebrile and in stable condition. Mental status is back to baseline. Patient has had Pseudomonas in the past, pan-sensitive. Urine cultures were without significant growth likely due to the fact that she was on Ciprofloxacin while getting her nephrostomy replaced. She is to be continued on Ciprofloxacin for 10 additional days to complete treatment of 14 days for complicated UTI.  Right percutaneous nephrostomy - Recent exchange on 09/24/2015 likely triggering  #1, recurrent admission with sepsis after tube exchange, followed by interventional radiology as an outpatient. She becomes septic every 6 weeks and has been doing so this January, February and April. Discussed with Dr. Gaynelle Reilly on the day of discharge, will see as an outpatient.  Hematuria - Likely secondary to nephrostomy tube exchange, clearing on d/c. Hb stable without need for transfusions.  Acute on Chronic kidney disease, stage 5 - Creatinine upon admission 7, improved on d/c. Has outpatient follow up with Dr. Florene Reilly this week.  Essential hypertension  Anemia of chronic disease - Hemoglobin stable, monitor Generalized weakness - Likely secondary to sepsis with pyelonephritis, back to baseline with treatment  Procedures:  None    Consultations:  None   Discharge Exam: Filed Vitals:   09/27/15 2128 09/28/15 0540 09/28/15 1000 09/28/15 1402  BP: 129/50 117/52 108/49 113/48  Pulse: 95 79 74 84  Temp: 99.1 F (37.3 C) 97.6 F (36.4 C) 98 F (36.7 C) 97.6 F (36.4 C)  TempSrc: Oral Oral Oral Oral  Resp: 18 18 16 18   Height:      Weight:  88.3 kg (194 lb 10.7 oz)    SpO2: 98% 99% 100% 99%    General: NAD Cardiovascular: RRR Respiratory: CTA biL  Discharge Instructions Activity:  As tolerated   Get Medicines reviewed and adjusted: Please take all your medications with you for your next visit with your Primary MD  Please request your Primary MD to go over all hospital tests and procedure/radiological results at the follow up, please ask your Primary MD to get all Hospital records sent to his/her office.  If you experience  worsening of your admission symptoms, develop shortness of breath, life threatening emergency, suicidal or homicidal thoughts you must seek medical attention immediately by calling 911 or calling your MD immediately if symptoms less severe.  You must read complete instructions/literature along with all the possible adverse reactions/side effects for  all the Medicines you take and that have been prescribed to you. Take any new Medicines after you have completely understood and accpet all the possible adverse reactions/side effects.   Do not drive when taking Pain medications.   Do not take more than prescribed Pain, Sleep and Anxiety Medications  Special Instructions: If you have smoked or chewed Tobacco in the last 2 yrs please stop smoking, stop any regular Alcohol and or any Recreational drug use.  Wear Seat belts while driving.  Please note  You were cared for by a hospitalist during your hospital stay. Once you are discharged, your primary care physician will handle any further medical issues. Please note that NO REFILLS for any discharge medications will be authorized once you are discharged, as it is imperative that you return to your primary care physician (or establish a relationship with a primary care physician if you do not have one) for your aftercare needs so that they can reassess your need for medications and monitor your lab values.    Medication List    TAKE these medications        acetaminophen 500 MG tablet  Commonly known as:  TYLENOL  Take 500 mg by mouth every 6 (six) hours as needed for moderate pain or headache.     bimatoprost 0.01 % Soln  Commonly known as:  LUMIGAN  Place 1 drop into both eyes at bedtime.     ciprofloxacin 250 MG tablet  Commonly known as:  CIPRO  Take 1 tablet (250 mg total) by mouth daily with breakfast.     feeding supplement (NEPRO CARB STEADY) Liqd  Take 237 mLs by mouth daily.     metoprolol tartrate 25 MG tablet  Commonly known as:  LOPRESSOR  TAKE 1/2 TABLET BY MOUTH TWICE DAILY     NYAMYC 100000 UNIT/GM Powd  Apply 1 application topically 2 (two) times daily.     ranitidine 150 MG tablet  Commonly known as:  ZANTAC  Take 150 mg by mouth 2 (two) times daily as needed for heartburn.     saccharomyces boulardii 250 MG capsule  Commonly known as:  FLORASTOR  Take  1 capsule (250 mg total) by mouth 2 (two) times daily.     sodium bicarbonate 650 MG tablet  Take 1 tablet (650 mg total) by mouth 2 (two) times daily.     sodium chloride 0.65 % Soln nasal spray  Commonly known as:  OCEAN  Place 1 spray into both nostrils 3 (three) times daily as needed for congestion.     timolol 0.5 % ophthalmic solution  Commonly known as:  BETIMOL  Place 1 drop into both eyes every morning.           Follow-up Information    Follow up with Amy Argyle, MD.   Specialty:  Internal Medicine   Why:  As needed   Contact information:   [place] [employer] [address] [address] [phone]       Follow up with Ailene Rud, MD. Schedule an appointment as soon as possible for a visit in 1 week.   Specialty:  Urology   Contact information:   [address]  [place] [address] [phone]       Follow up with [facility], MD In 1 week.   Specialty:  Nephrology   Contact information:   [place] [address] [phone]       The results of significant diagnostics from this hospitalization (including imaging, microbiology, ancillary and laboratory) are listed below for reference.    Significant Diagnostic Studies: Dg Chest 2 View  09/17/2015  CLINICAL DATA:  Fever and headache EXAM: CHEST  2 VIEW COMPARISON:  August 13, 2015 FINDINGS: There is slight scarring in the left base. Lungs elsewhere clear. Heart size and pulmonary vascularity are normal. No adenopathy. Central catheter tip is in the superior vena cava. No pneumothorax. There is a moderate hiatal hernia. There is a drain in the right retroperitoneal region. IMPRESSION: Slight scarring left base. No edema or consolidation. Hiatal hernia present. No pneumothorax. Electronically Signed   By: Lowella Grip III M.D.   On: 09/17/2015 13:54   Ir Nephrostomy Exchange Right  09/24/2015  INDICATION: Obstructive right  hydronephrosis, chronic indwelling right nephrostomy catheter, routine exchange EXAM: IR EXCHANGE NEPHROSTOMY RIGHT COMPARISON:  08/12/2005 MEDICATIONS: None. ANESTHESIA/SEDATION: None. CONTRAST:  44mL ISOVUE-300 IOPAMIDOL (ISOVUE-300) INJECTION 61% - administered into the collecting system(s) FLUOROSCOPY TIME:  Fluoroscopy Time: 1 minutes 48 seconds (43 mGy). COMPLICATIONS: None immediate. PROCEDURE: Informed written consent was obtained from the patient after a thorough discussion of the procedural risks, benefits and alternatives. All questions were addressed. Maximal Sterile Barrier Technique was utilized including caps, mask, sterile gowns, sterile gloves, sterile drape, hand hygiene and skin antiseptic. A timeout was performed prior to the initiation of the procedure. Under sterile conditions, the existing right nephrostomy catheter was injected to confirm position. Catheter was cut and removed over an Amplatz guidewire. New catheter was advanced with the retention loop formed in the pelvis. Position confirmed with contrast injection. Images obtained for documentation. Catheter secured with a sterile dressing and connected to gravity drainage bag. IMPRESSION: Successful fluoroscopic exchange of the right 10 [place] external nephrostomy catheter Electronically Signed   By: Jerilynn Mages.  Shick M.D.   On: 09/24/2015 16:56    Microbiology: Recent Results (from the past 240 hour(s))  Blood Culture (routine x 2)     Status: None (Preliminary result)   Collection Time: 09/25/15 10:35 AM  Result Value Ref Range Status   Specimen Description BLOOD RIGHT HAND  Final   Special Requests IN PEDIATRIC BOTTLE 1ML  Final   Culture   Final    NO GROWTH 3 DAYS Performed at [facility]    Report Status PENDING  Incomplete  Urine culture     Status: Abnormal   Collection Time: 09/25/15 11:46 AM  Result Value Ref Range Status   Specimen Description URINE, RANDOM PED BAG  Final   Special Requests cipro  Final    Culture 10,000 COLONIES/mL YEAST (A)  Final   Report Status 09/27/2015 FINAL  Final  Blood Culture (routine x 2)     Status: None (Preliminary result)   Collection Time: 09/25/15  3:12 PM  Result Value Ref Range Status   Specimen Description BLOOD RIGHT HAND  Final   Special Requests BOTTLES DRAWN AEROBIC AND ANAEROBIC 5CC  Final   Culture   Final    NO GROWTH 3 DAYS Performed at [facility]  Report Status PENDING  Incomplete  MRSA PCR Screening     Status: Abnormal   Collection Time: 09/26/15  3:14 AM  Result Value Ref Range Status   MRSA by PCR POSITIVE (A) NEGATIVE Final    Comment:        The GeneXpert MRSA Assay (FDA approved for NASAL specimens only), is one component of a comprehensive MRSA colonization surveillance program. It is not intended to diagnose MRSA infection nor to guide or monitor treatment for MRSA infections. RESULT CALLED TO, READ BACK BY AND VERIFIED WITH: S.WALTON RN AT 0809 ON 09/26/15 BY S.VANHOORNE      Labs: Basic Metabolic Panel:  Recent Labs Lab 09/25/15 1037 09/26/15 0503 09/27/15 0407 09/28/15 0439  NA 131* 134* 137 140  K 5.0 5.0 4.4 4.1  CL 104 108 111 114*  CO2 15* 14* 15* 16*  GLUCOSE 110* 94 100* 98  BUN 85* 76* 76* 69*  CREATININE 7.07* 6.87* 6.66* 6.40*  CALCIUM 8.8* 8.2* 8.5* 8.2*   Liver Function Tests:  Recent Labs Lab 09/27/15 0407 09/28/15 0439  AST 15 9*  ALT 14 13*  ALKPHOS 61 57  BILITOT 0.2* 0.3  PROT 5.5* 5.5*  ALBUMIN 2.3* 2.3*   No results for input(s): LIPASE, AMYLASE in the last 168 hours. No results for input(s): AMMONIA in the last 168 hours. CBC:  Recent Labs Lab 09/25/15 1037 09/26/15 0503 09/27/15 0407 09/28/15 0439  WBC 15.5* 12.9* 8.9 12.1*  NEUTROABS 14.5*  --   --   --   HGB 10.8* 9.6* 9.5* 8.5*  HCT 34.7* 28.9* 29.7* 26.2*  MCV 84.2 82.6 84.1 82.1  PLT 312 264 256 279   Cardiac Enzymes: No results for input(s): CKTOTAL, CKMB, CKMBINDEX, TROPONINI in the last 168  hours. BNP: BNP (last 3 results)  Recent Labs  02/26/15 0530 05/23/15 0602 07/04/15 1014  BNP 35.3 37.2 183.0*     Signed:  Leea Rambeau  [facility] 09/28/2015, 4:32 PM

## 2015-09-28 NOTE — Progress Notes (Signed)
Pharmacy Antibiotic Note  [name] is a 70 y.o. female admitted on 09/25/2015 with fever and sepsis d/t suspected pyelonephritis following routine R PCN exchange yesterday. Started on cipro x 2 days prior to exchange. Pharmacy has been consulted for cefepime dosing.  CrCl < 20 ml/min at baseline.  Temp: now afebrile WBC: slightly elev again Renal: SCr ~4 at baseline, slowly improving, CrCl ~9CG  Plan: Cont Cefepime 250mg  IV q24h Follow renal function Not treating yeast in urine   Height: 5\' 3"  (160 cm) Weight: 194 lb 10.7 oz (88.3 kg) IBW/kg (Calculated) : 52.4  Temp (24hrs), Avg:98.4 F (36.9 C), Min:97.6 F (36.4 C), Max:99.1 F (37.3 C)   Recent Labs Lab 09/25/15 1037 09/25/15 1045 09/26/15 0503 09/27/15 0407 09/28/15 0439  WBC 15.5*  --  12.9* 8.9 12.1*  CREATININE 7.07*  --  6.87* 6.66* 6.40*  LATICACIDVEN  --  1.13  --   --   --     Estimated Creatinine Clearance: 8.6 mL/min (by C-G formula based on Cr of 6.4).    Allergies  Allergen Reactions  . Vicodin [Hydrocodone-Acetaminophen] Nausea And Vomiting  . Chlorhexidine Rash    Sunburn    rash  . Percocet [Oxycodone-Acetaminophen] Nausea And Vomiting   Antimicrobials this admission: 4/7 >> Zosyn x1 4/7 >> Cefepime >>  Dose adjustments this admission: ---  Microbiology results: Recent MSSA, pseudomonas (pan-sens) in urine 4/7 BCx: NGTD 4/7 UCx: 10,000 yeast, per MD not significant 4/8 MRSA PCR: positive  Thank you for allowing pharmacy to be a part of this patient's care.  Romeo Rabon, PharmD, pager [phone]. 09/28/2015,1:04 PM.

## 2015-09-28 NOTE — Discharge Instructions (Signed)
Follow with [name], MD in 5-7 days  Please get a complete blood count and chemistry panel checked by your Primary MD at your next visit, and again as instructed by your Primary MD. Please get your medications reviewed and adjusted by your Primary MD.  Please request your Primary MD to go over all Hospital Tests and Procedure/Radiological results at the follow up, please get all Hospital records sent to your Prim MD by signing hospital release before you go home.  If you had Pneumonia of Lung problems at the Hospital: Please get a 2 view Chest X ray done in 6-8 weeks after hospital discharge or sooner if instructed by your Primary MD.  If you have Congestive Heart Failure: Please call your Cardiologist or Primary MD anytime you have any of the following symptoms:  1) 3 pound weight gain in 24 hours or 5 pounds in 1 week  2) shortness of breath, with or without a dry hacking cough  3) swelling in the hands, feet or stomach  4) if you have to sleep on extra pillows at night in order to breathe  Follow cardiac low salt diet and 1.5 lit/day fluid restriction.  If you have diabetes Accuchecks 4 times/day, Once in AM empty stomach and then before each meal. Log in all results and show them to your primary doctor at your next visit. If any glucose reading is under 80 or above 300 call your primary MD immediately.  If you have Seizure/Convulsions/Epilepsy: Please do not drive, operate heavy machinery, participate in activities at heights or participate in high speed sports until you have seen by Primary MD or a Neurologist and advised to do so again.  If you had Gastrointestinal Bleeding: Please ask your Primary MD to check a complete blood count within one week of discharge or at your next visit. Your endoscopic/colonoscopic biopsies that are pending at the time of discharge, will also need to followed by your Primary MD.  Get Medicines reviewed and adjusted. Please take all your  medications with you for your next visit with your Primary MD  Please request your Primary MD to go over all hospital tests and procedure/radiological results at the follow up, please ask your Primary MD to get all Hospital records sent to his/her office.  If you experience worsening of your admission symptoms, develop shortness of breath, life threatening emergency, suicidal or homicidal thoughts you must seek medical attention immediately by calling 911 or calling your MD immediately  if symptoms less severe.  You must read complete instructions/literature along with all the possible adverse reactions/side effects for all the Medicines you take and that have been prescribed to you. Take any new Medicines after you have completely understood and accpet all the possible adverse reactions/side effects.   Do not drive or operate heavy machinery when taking Pain medications.   Do not take more than prescribed Pain, Sleep and Anxiety Medications  Special Instructions: If you have smoked or chewed Tobacco  in the last 2 yrs please stop smoking, stop any regular Alcohol  and or any Recreational drug use.  Wear Seat belts while driving.  Please note You were cared for by a hospitalist during your hospital stay. If you have any questions about your discharge medications or the care you received while you were in the hospital after you are discharged, you can call the unit and asked to speak with the hospitalist on call if the hospitalist that took care of you is not available. Once  you are discharged, your primary care physician will handle any further medical issues. Please note that NO REFILLS for any discharge medications will be authorized once you are discharged, as it is imperative that you return to your primary care physician (or establish a relationship with a primary care physician if you do not have one) for your aftercare needs so that they can reassess your need for medications and monitor your  lab values.  You can reach the hospitalist office at phone [phone] or fax [phone]   If you do not have a primary care physician, you can call [phone] for a physician referral.  Activity: As tolerated with Full fall precautions use walker/cane & assistance as needed  Diet: renal  Disposition Home

## 2015-09-30 LAB — CULTURE, BLOOD (ROUTINE X 2)
Culture: NO GROWTH
Culture: NO GROWTH

## 2015-10-01 ENCOUNTER — Encounter (HOSPITAL_COMMUNITY)
Admission: RE | Admit: 2015-10-01 | Discharge: 2015-10-01 | Disposition: A | Discharge status: Home / Self Care | Payer: Medicare Other | Source: Ambulatory Visit | Attending: Nephrology | Admitting: Nephrology

## 2015-10-01 DIAGNOSIS — D631 Anemia in chronic kidney disease: Secondary | ICD-10-CM | POA: Diagnosis not present

## 2015-10-01 DIAGNOSIS — N184 Chronic kidney disease, stage 4 (severe): Secondary | ICD-10-CM | POA: Diagnosis not present

## 2015-10-01 DIAGNOSIS — Z5181 Encounter for therapeutic drug level monitoring: Secondary | ICD-10-CM | POA: Diagnosis not present

## 2015-10-01 DIAGNOSIS — Z79899 Other long term (current) drug therapy: Secondary | ICD-10-CM | POA: Diagnosis not present

## 2015-10-01 LAB — RENAL FUNCTION PANEL
Albumin: 2.5 g/dL — ABNORMAL LOW (ref 3.5–5.0)
Anion gap: 13 (ref 5–15)
BUN: 71 mg/dL — ABNORMAL HIGH (ref 6–20)
CO2: 17 mmol/L — ABNORMAL LOW (ref 22–32)
Calcium: 8.7 mg/dL — ABNORMAL LOW (ref 8.9–10.3)
Chloride: 109 mmol/L (ref 101–111)
Creatinine, Ser: 6.43 mg/dL — ABNORMAL HIGH (ref 0.44–1.00)
GFR calc Af Amer: 7 mL/min — ABNORMAL LOW (ref >60)
GFR calc non Af Amer: 6 mL/min — ABNORMAL LOW (ref >60)
Glucose, Bld: 93 mg/dL (ref 65–99)
Phosphorus: 5.5 mg/dL — ABNORMAL HIGH (ref 2.5–4.6)
Potassium: 4.5 mmol/L (ref 3.5–5.1)
Sodium: 139 mmol/L (ref 135–145)

## 2015-10-01 LAB — IRON AND TIBC
Iron: 41 ug/dL (ref 28–170)
Saturation Ratios: 21 % (ref 10.4–31.8)
TIBC: 199 ug/dL — ABNORMAL LOW (ref 250–450)
UIBC: 158 ug/dL

## 2015-10-01 LAB — FERRITIN: Ferritin: 206 ng/mL (ref 11–307)

## 2015-10-01 MED ORDER — DARBEPOETIN ALFA 100 MCG/0.5ML IJ SOSY
100.0000 ug | PREFILLED_SYRINGE | INTRAMUSCULAR | Status: DC
  Administered 2015-10-01: 100 ug via SUBCUTANEOUS

## 2015-10-01 MED ORDER — DARBEPOETIN ALFA 100 MCG/0.5ML IJ SOSY
PREFILLED_SYRINGE | INTRAMUSCULAR | Status: AC
  Filled 2015-10-01: qty 0.5

## 2015-10-02 LAB — POCT HEMOGLOBIN-HEMACUE: Hemoglobin: 9.1 g/dL — ABNORMAL LOW (ref 12.0–15.0)

## 2015-10-06 DIAGNOSIS — N1330 Unspecified hydronephrosis: Secondary | ICD-10-CM | POA: Diagnosis not present

## 2015-10-06 DIAGNOSIS — Z87440 Personal history of urinary (tract) infections: Secondary | ICD-10-CM | POA: Diagnosis not present

## 2015-10-06 DIAGNOSIS — N390 Urinary tract infection, site not specified: Secondary | ICD-10-CM | POA: Diagnosis not present

## 2015-10-07 DIAGNOSIS — N200 Calculus of kidney: Secondary | ICD-10-CM | POA: Diagnosis not present

## 2015-10-07 DIAGNOSIS — N184 Chronic kidney disease, stage 4 (severe): Secondary | ICD-10-CM | POA: Diagnosis not present

## 2015-10-07 DIAGNOSIS — A419 Sepsis, unspecified organism: Secondary | ICD-10-CM | POA: Diagnosis not present

## 2015-10-08 DIAGNOSIS — N179 Acute kidney failure, unspecified: Secondary | ICD-10-CM | POA: Diagnosis not present

## 2015-10-08 DIAGNOSIS — N185 Chronic kidney disease, stage 5: Secondary | ICD-10-CM | POA: Diagnosis not present

## 2015-10-08 DIAGNOSIS — N3010 Interstitial cystitis (chronic) without hematuria: Secondary | ICD-10-CM | POA: Diagnosis not present

## 2015-10-08 DIAGNOSIS — N139 Obstructive and reflux uropathy, unspecified: Secondary | ICD-10-CM | POA: Diagnosis not present

## 2015-10-08 DIAGNOSIS — Z95828 Presence of other vascular implants and grafts: Secondary | ICD-10-CM | POA: Diagnosis not present

## 2015-10-08 DIAGNOSIS — D649 Anemia, unspecified: Secondary | ICD-10-CM | POA: Diagnosis not present

## 2015-10-08 DIAGNOSIS — I10 Essential (primary) hypertension: Secondary | ICD-10-CM | POA: Diagnosis not present

## 2015-10-08 DIAGNOSIS — I770 Arteriovenous fistula, acquired: Secondary | ICD-10-CM | POA: Diagnosis not present

## 2015-10-09 ENCOUNTER — Encounter (HOSPITAL_COMMUNITY)
Admission: RE | Admit: 2015-10-09 | Discharge: 2015-10-09 | Disposition: A | Discharge status: Home / Self Care | Payer: Medicare Other | Source: Ambulatory Visit | Attending: Nephrology | Admitting: Nephrology

## 2015-10-09 ENCOUNTER — Encounter (HOSPITAL_COMMUNITY): Payer: Self-pay | Admitting: *Deleted

## 2015-10-09 DIAGNOSIS — N184 Chronic kidney disease, stage 4 (severe): Secondary | ICD-10-CM | POA: Diagnosis not present

## 2015-10-09 MED ORDER — SODIUM CHLORIDE 0.9 % IV SOLN
510.0000 mg | Freq: Once | INTRAVENOUS | Status: AC
  Administered 2015-10-09: 510 mg via INTRAVENOUS
  Filled 2015-10-09: qty 17

## 2015-10-09 NOTE — Progress Notes (Signed)
Pt denies any acute cardiopulmonary issues. Pt is under the care of Dr. Irish Lack, Cardiology. Pt made aware to stop otc vitamins, fish oil, herbal medications and NSAID's. Pt verbalized understanding of all pre-op instructions.

## 2015-10-10 ENCOUNTER — Ambulatory Visit (HOSPITAL_COMMUNITY)
Admission: RE | Admit: 2015-10-10 | Discharge: 2015-10-10 | Disposition: A | Discharge status: Home / Self Care | Payer: Medicare Other | Source: Ambulatory Visit | Attending: Interventional Radiology | Admitting: Interventional Radiology

## 2015-10-10 ENCOUNTER — Other Ambulatory Visit: Payer: Self-pay | Admitting: Interventional Radiology

## 2015-10-10 DIAGNOSIS — N1330 Unspecified hydronephrosis: Secondary | ICD-10-CM | POA: Diagnosis not present

## 2015-10-11 MED ORDER — DEXTROSE 5 % IV SOLN
1.5000 g | INTRAVENOUS | Status: AC
  Administered 2015-10-12: 1.5 g via INTRAVENOUS
  Filled 2015-10-11: qty 1.5

## 2015-10-12 ENCOUNTER — Ambulatory Visit (HOSPITAL_COMMUNITY): Payer: Medicare Other | Admitting: Anesthesiology

## 2015-10-12 ENCOUNTER — Encounter (HOSPITAL_COMMUNITY)
Admission: RE | Disposition: A | Discharge status: Home / Self Care | Payer: Self-pay | Source: Ambulatory Visit | Attending: Vascular Surgery

## 2015-10-12 ENCOUNTER — Observation Stay (HOSPITAL_COMMUNITY)
Admission: RE | Admit: 2015-10-12 | Discharge: 2015-10-13 | Disposition: A | Discharge status: Home / Self Care | Payer: Medicare Other | Source: Ambulatory Visit | Attending: Vascular Surgery | Admitting: Vascular Surgery

## 2015-10-12 ENCOUNTER — Encounter (HOSPITAL_COMMUNITY): Payer: Self-pay | Admitting: General Practice

## 2015-10-12 ENCOUNTER — Telehealth: Payer: Self-pay | Admitting: Vascular Surgery

## 2015-10-12 DIAGNOSIS — N183 Chronic kidney disease, stage 3 unspecified: Secondary | ICD-10-CM | POA: Diagnosis present

## 2015-10-12 DIAGNOSIS — Z992 Dependence on renal dialysis: Secondary | ICD-10-CM | POA: Insufficient documentation

## 2015-10-12 DIAGNOSIS — N185 Chronic kidney disease, stage 5: Secondary | ICD-10-CM | POA: Insufficient documentation

## 2015-10-12 DIAGNOSIS — I132 Hypertensive heart and chronic kidney disease with heart failure and with stage 5 chronic kidney disease, or end stage renal disease: Secondary | ICD-10-CM | POA: Diagnosis not present

## 2015-10-12 DIAGNOSIS — I252 Old myocardial infarction: Secondary | ICD-10-CM | POA: Diagnosis not present

## 2015-10-12 DIAGNOSIS — E785 Hyperlipidemia, unspecified: Secondary | ICD-10-CM | POA: Insufficient documentation

## 2015-10-12 DIAGNOSIS — I2510 Atherosclerotic heart disease of native coronary artery without angina pectoris: Secondary | ICD-10-CM | POA: Insufficient documentation

## 2015-10-12 DIAGNOSIS — Z885 Allergy status to narcotic agent status: Secondary | ICD-10-CM | POA: Insufficient documentation

## 2015-10-12 DIAGNOSIS — I739 Peripheral vascular disease, unspecified: Secondary | ICD-10-CM | POA: Diagnosis not present

## 2015-10-12 DIAGNOSIS — I509 Heart failure, unspecified: Secondary | ICD-10-CM | POA: Diagnosis not present

## 2015-10-12 DIAGNOSIS — K219 Gastro-esophageal reflux disease without esophagitis: Secondary | ICD-10-CM | POA: Diagnosis not present

## 2015-10-12 DIAGNOSIS — I129 Hypertensive chronic kidney disease with stage 1 through stage 4 chronic kidney disease, or unspecified chronic kidney disease: Secondary | ICD-10-CM | POA: Diagnosis present

## 2015-10-12 HISTORY — PX: BASCILIC VEIN TRANSPOSITION: SHX5742

## 2015-10-12 LAB — POCT I-STAT 4, (NA,K, GLUC, HGB,HCT)
Glucose, Bld: 100 mg/dL — ABNORMAL HIGH (ref 65–99)
HCT: 34 % — ABNORMAL LOW (ref 36.0–46.0)
Hemoglobin: 11.6 g/dL — ABNORMAL LOW (ref 12.0–15.0)
Potassium: 5.1 mmol/L (ref 3.5–5.1)
Sodium: 139 mmol/L (ref 135–145)

## 2015-10-12 SURGERY — TRANSPOSITION, VEIN, BASILIC
Anesthesia: Monitor Anesthesia Care | Site: Arm Upper | Laterality: Left

## 2015-10-12 MED ORDER — LIDOCAINE HCL (PF) 1 % IJ SOLN
INTRAMUSCULAR | Status: AC
  Filled 2015-10-12: qty 30

## 2015-10-12 MED ORDER — METOPROLOL TARTRATE 1 MG/ML IV SOLN: 2.0000 mg | INTRAVENOUS | Status: DC | PRN

## 2015-10-12 MED ORDER — METOPROLOL TARTRATE 12.5 MG HALF TABLET
12.5000 mg | ORAL_TABLET | Freq: Two times a day (BID) | ORAL | Status: DC
  Administered 2015-10-12: 12.5 mg via ORAL
  Filled 2015-10-12 (×2): qty 1

## 2015-10-12 MED ORDER — MIDAZOLAM HCL 2 MG/2ML IJ SOLN
INTRAMUSCULAR | Status: AC
  Filled 2015-10-12: qty 2

## 2015-10-12 MED ORDER — FENTANYL CITRATE (PF) 100 MCG/2ML IJ SOLN
25.0000 ug | INTRAMUSCULAR | Status: DC | PRN
  Administered 2015-10-12 (×2): 25 ug via INTRAVENOUS

## 2015-10-12 MED ORDER — TIMOLOL MALEATE 0.5 % OP SOLN
1.0000 [drp] | Freq: Every morning | OPHTHALMIC | Status: DC
  Filled 2015-10-12: qty 5

## 2015-10-12 MED ORDER — LIDOCAINE HCL (CARDIAC) 20 MG/ML IV SOLN
INTRAVENOUS | Status: DC | PRN
  Administered 2015-10-12: 80 mg via INTRAVENOUS

## 2015-10-12 MED ORDER — MORPHINE SULFATE (PF) 2 MG/ML IV SOLN
1.0000 mg | INTRAVENOUS | Status: DC | PRN
  Filled 2015-10-12: qty 1

## 2015-10-12 MED ORDER — HYDRALAZINE HCL 20 MG/ML IJ SOLN: 5.0000 mg | INTRAMUSCULAR | Status: DC | PRN

## 2015-10-12 MED ORDER — SODIUM CHLORIDE 0.9% FLUSH
3.0000 mL | Freq: Two times a day (BID) | INTRAVENOUS | Status: DC
  Administered 2015-10-12: 3 mL via INTRAVENOUS

## 2015-10-12 MED ORDER — LIDOCAINE HCL (PF) 1 % IJ SOLN
INTRAMUSCULAR | Status: DC | PRN
  Administered 2015-10-12: 30 mL

## 2015-10-12 MED ORDER — ONDANSETRON HCL 4 MG/2ML IJ SOLN: 4.0000 mg | Freq: Four times a day (QID) | INTRAMUSCULAR | Status: DC | PRN

## 2015-10-12 MED ORDER — ACETAMINOPHEN 500 MG PO TABS: 500.0000 mg | ORAL_TABLET | Freq: Four times a day (QID) | ORAL | Status: DC | PRN

## 2015-10-12 MED ORDER — NEPRO/CARBSTEADY PO LIQD
237.0000 mL | ORAL | Status: DC
  Administered 2015-10-12: 237 mL via ORAL
  Filled 2015-10-12 (×3): qty 237

## 2015-10-12 MED ORDER — FENTANYL CITRATE (PF) 100 MCG/2ML IJ SOLN
INTRAMUSCULAR | Status: DC | PRN
  Administered 2015-10-12: 75 ug via INTRAVENOUS
  Administered 2015-10-12 (×3): 25 ug via INTRAVENOUS

## 2015-10-12 MED ORDER — PHENYLEPHRINE HCL 10 MG/ML IJ SOLN
INTRAMUSCULAR | Status: DC | PRN
  Administered 2015-10-12: 40 ug via INTRAVENOUS
  Administered 2015-10-12: 20 ug via INTRAVENOUS
  Administered 2015-10-12 (×4): 40 ug via INTRAVENOUS
  Administered 2015-10-12: 20 ug via INTRAVENOUS
  Administered 2015-10-12: 40 ug via INTRAVENOUS

## 2015-10-12 MED ORDER — LIDOCAINE HCL (CARDIAC) 20 MG/ML IV SOLN
INTRAVENOUS | Status: AC
  Filled 2015-10-12: qty 5

## 2015-10-12 MED ORDER — PHENOL 1.4 % MT LIQD: 1.0000 | OROMUCOSAL | Status: DC | PRN

## 2015-10-12 MED ORDER — PHENYLEPHRINE 40 MCG/ML (10ML) SYRINGE FOR IV PUSH (FOR BLOOD PRESSURE SUPPORT)
PREFILLED_SYRINGE | INTRAVENOUS | Status: AC
  Filled 2015-10-12: qty 10

## 2015-10-12 MED ORDER — SODIUM CHLORIDE 0.9 % IV SOLN
INTRAVENOUS | Status: DC | PRN
  Administered 2015-10-12: 500 mL

## 2015-10-12 MED ORDER — GUAIFENESIN-DM 100-10 MG/5ML PO SYRP: 15.0000 mL | ORAL_SOLUTION | ORAL | Status: DC | PRN

## 2015-10-12 MED ORDER — PROPOFOL 10 MG/ML IV BOLUS
INTRAVENOUS | Status: DC | PRN
  Administered 2015-10-12: 150 mg via INTRAVENOUS
  Administered 2015-10-12: 10 mg via INTRAVENOUS
  Administered 2015-10-12 (×2): 20 mg via INTRAVENOUS

## 2015-10-12 MED ORDER — BISACODYL 10 MG RE SUPP: 10.0000 mg | Freq: Every day | RECTAL | Status: DC | PRN

## 2015-10-12 MED ORDER — TRAMADOL HCL 50 MG PO TABS: 50.0000 mg | ORAL_TABLET | Freq: Four times a day (QID) | ORAL | Status: DC | PRN

## 2015-10-12 MED ORDER — FENTANYL CITRATE (PF) 100 MCG/2ML IJ SOLN
INTRAMUSCULAR | Status: AC
  Administered 2015-10-12: 25 ug via INTRAVENOUS
  Filled 2015-10-12: qty 2

## 2015-10-12 MED ORDER — PROPOFOL 10 MG/ML IV BOLUS
INTRAVENOUS | Status: AC
  Filled 2015-10-12: qty 40

## 2015-10-12 MED ORDER — SALINE SPRAY 0.65 % NA SOLN: 1.0000 | Freq: Three times a day (TID) | NASAL | Status: DC | PRN

## 2015-10-12 MED ORDER — ONDANSETRON HCL 4 MG/2ML IJ SOLN
INTRAMUSCULAR | Status: DC | PRN
  Administered 2015-10-12: 4 mg via INTRAVENOUS

## 2015-10-12 MED ORDER — HEMOSTATIC AGENTS (NO CHARGE) OPTIME
TOPICAL | Status: DC | PRN
  Administered 2015-10-12 (×2): 1 via TOPICAL

## 2015-10-12 MED ORDER — LABETALOL HCL 5 MG/ML IV SOLN: 10.0000 mg | INTRAVENOUS | Status: DC | PRN

## 2015-10-12 MED ORDER — SODIUM CHLORIDE 0.9% FLUSH: 3.0000 mL | INTRAVENOUS | Status: DC | PRN

## 2015-10-12 MED ORDER — FAMOTIDINE 20 MG PO TABS
20.0000 mg | ORAL_TABLET | Freq: Two times a day (BID) | ORAL | Status: DC
  Administered 2015-10-12 – 2015-10-13 (×3): 20 mg via ORAL
  Filled 2015-10-12 (×3): qty 1

## 2015-10-12 MED ORDER — LATANOPROST 0.005 % OP SOLN
1.0000 [drp] | Freq: Every day | OPHTHALMIC | Status: DC
  Administered 2015-10-12: 1 [drp] via OPHTHALMIC
  Filled 2015-10-12: qty 2.5

## 2015-10-12 MED ORDER — ONDANSETRON HCL 4 MG/2ML IJ SOLN
INTRAMUSCULAR | Status: AC
  Filled 2015-10-12: qty 2

## 2015-10-12 MED ORDER — SODIUM CHLORIDE 0.9 % IV SOLN: 250.0000 mL | INTRAVENOUS | Status: DC | PRN

## 2015-10-12 MED ORDER — SODIUM CHLORIDE 0.9 % IV SOLN
INTRAVENOUS | Status: DC
  Administered 2015-10-12 (×2): via INTRAVENOUS

## 2015-10-12 MED ORDER — FENTANYL CITRATE (PF) 250 MCG/5ML IJ SOLN
INTRAMUSCULAR | Status: AC
  Filled 2015-10-12: qty 5

## 2015-10-12 MED ORDER — 0.9 % SODIUM CHLORIDE (POUR BTL) OPTIME
TOPICAL | Status: DC | PRN
  Administered 2015-10-12: 1000 mL

## 2015-10-12 MED ORDER — SODIUM BICARBONATE 650 MG PO TABS
650.0000 mg | ORAL_TABLET | Freq: Two times a day (BID) | ORAL | Status: DC
  Administered 2015-10-12 – 2015-10-13 (×3): 650 mg via ORAL
  Filled 2015-10-12 (×3): qty 1

## 2015-10-12 MED ORDER — TRAMADOL HCL 50 MG PO TABS
50.0000 mg | ORAL_TABLET | Freq: Four times a day (QID) | ORAL | Status: DC | PRN
  Administered 2015-10-12 – 2015-10-13 (×3): 50 mg via ORAL
  Filled 2015-10-12 (×3): qty 1

## 2015-10-12 SURGICAL SUPPLY — 44 items
CANISTER SUCTION 2500CC (MISCELLANEOUS) ×2 IMPLANT
CLIP TI MEDIUM 24 (CLIP) ×2 IMPLANT
CLIP TI WIDE RED SMALL 24 (CLIP) ×2 IMPLANT
CORDS BIPOLAR (ELECTRODE) IMPLANT
COVER PROBE W GEL 5X96 (DRAPES) ×1 IMPLANT
COVER SURGICAL LIGHT HANDLE (MISCELLANEOUS) ×1 IMPLANT
DECANTER SPIKE VIAL GLASS SM (MISCELLANEOUS) ×2 IMPLANT
DRAPE ORTHO SPLIT 77X108 STRL (DRAPES) ×2
DRAPE SURG ORHT 6 SPLT 77X108 (DRAPES) IMPLANT
DRSG COVADERM 4X10 (GAUZE/BANDAGES/DRESSINGS) IMPLANT
DRSG COVADERM 4X8 (GAUZE/BANDAGES/DRESSINGS) IMPLANT
ELECT REM PT RETURN 9FT ADLT (ELECTROSURGICAL) ×2
ELECTRODE REM PT RTRN 9FT ADLT (ELECTROSURGICAL) ×1 IMPLANT
GLOVE BIO SURGEON STRL SZ 6.5 (GLOVE) ×2 IMPLANT
GLOVE BIO SURGEON STRL SZ7 (GLOVE) ×2 IMPLANT
GLOVE BIOGEL PI IND STRL 6.5 (GLOVE) IMPLANT
GLOVE BIOGEL PI IND STRL 7.0 (GLOVE) IMPLANT
GLOVE BIOGEL PI IND STRL 7.5 (GLOVE) ×1 IMPLANT
GLOVE BIOGEL PI INDICATOR 6.5 (GLOVE) ×1
GLOVE BIOGEL PI INDICATOR 7.0 (GLOVE) ×2
GLOVE BIOGEL PI INDICATOR 7.5 (GLOVE) ×1
GLOVE ECLIPSE 7.5 STRL STRAW (GLOVE) ×1 IMPLANT
GLOVE SURG SS PI 6.5 STRL IVOR (GLOVE) ×2 IMPLANT
GOWN SPEC L4 XLG W/TWL (GOWN DISPOSABLE) ×1 IMPLANT
GOWN STRL REUS W/ TWL LRG LVL3 (GOWN DISPOSABLE) ×3 IMPLANT
GOWN STRL REUS W/TWL LRG LVL3 (GOWN DISPOSABLE) ×8
KIT BASIN OR (CUSTOM PROCEDURE TRAY) ×2 IMPLANT
KIT ROOM TURNOVER OR (KITS) ×2 IMPLANT
LIQUID BAND (GAUZE/BANDAGES/DRESSINGS) ×2 IMPLANT
NS IRRIG 1000ML POUR BTL (IV SOLUTION) ×2 IMPLANT
PACK CV ACCESS (CUSTOM PROCEDURE TRAY) ×2 IMPLANT
PAD ARMBOARD 7.5X6 YLW CONV (MISCELLANEOUS) ×4 IMPLANT
SPONGE SURGIFOAM ABS GEL 100 (HEMOSTASIS) IMPLANT
STAPLER VISISTAT 35W (STAPLE) IMPLANT
SUT MNCRL AB 4-0 PS2 18 (SUTURE) ×3 IMPLANT
SUT PROLENE 6 0 BV (SUTURE) ×2 IMPLANT
SUT PROLENE 7 0 BV 1 (SUTURE) ×1 IMPLANT
SUT SILK 2 0 SH (SUTURE) ×2 IMPLANT
SUT VIC AB 2-0 CT1 27 (SUTURE) ×2
SUT VIC AB 2-0 CT1 TAPERPNT 27 (SUTURE) ×1 IMPLANT
SUT VIC AB 3-0 SH 27 (SUTURE) ×8
SUT VIC AB 3-0 SH 27X BRD (SUTURE) ×2 IMPLANT
UNDERPAD 30X30 INCONTINENT (UNDERPADS AND DIAPERS) ×2 IMPLANT
WATER STERILE IRR 1000ML POUR (IV SOLUTION) ×2 IMPLANT

## 2015-10-12 NOTE — Telephone Encounter (Signed)
sched appt 6/2 at 8:30. Spoke to pt's daughter. Pt will be out of town, resched appt for 6/9 at 10.

## 2015-10-12 NOTE — Care Management Obs Status (Signed)
[place] NOTIFICATION   Patient Details  Name: [name] MRN: [id number] Date of Birth: Oct 05, 1945   Medicare Observation Status Notification Given:  Yes    Erenest Rasher, RN 10/12/2015, 1:57 PM

## 2015-10-12 NOTE — Anesthesia Procedure Notes (Signed)
Procedure Name: LMA Insertion Date/Time: 10/12/2015 7:38 AM Performed by: Terrill Mohr Pre-anesthesia Checklist: Emergency Drugs available, Patient identified, Suction available and Patient being monitored Patient Re-evaluated:Patient Re-evaluated prior to inductionOxygen Delivery Method: Circle system utilized Preoxygenation: Pre-oxygenation with 100% oxygen Intubation Type: IV induction Ventilation: Mask ventilation without difficulty LMA: LMA inserted LMA Size: 4.0 Number of attempts: 1 Placement Confirmation: positive ETCO2 and breath sounds checked- equal and bilateral Tube secured with: Tape (taped across cheeks) Dental Injury: Teeth and Oropharynx as per pre-operative assessment

## 2015-10-12 NOTE — H&P (Signed)
Brief History and Physical  History of Present Illness  [name] is a 70 y.o. female who presents with chief complaint: chronic kidney disease stage 3.  The patient presents today for L 2nd stage BVT.    Past Medical History  Diagnosis Date  . GERD (gastroesophageal reflux disease)   . Glaucoma   . H/O renal calculi 2002 & 2006  . H/O hiatal hernia   . Headache(784.0)     migraine  . Pneumonia     dx 10-06-2014 per CXR--  on 10-27-2014 pt states finished antibiotic and denies cough or fever  . Hyperlipidemia   . History of MI (myocardial infarction)     10/ 2013 in setting of Septic Shock  . History of atrial fibrillation     10/ 2013  in setting of Septic Shock  . History of CHF (congestive heart failure)     10/ 2013 in setting of septic shock  . Sigmoid diverticulosis   . Nephrolithiasis     bilateral  . CKD (chronic kidney disease), stage III     nephrologist-  dr Florene Glen  . Right ureteral stone   . History of acute respiratory failure     10/ 2013  -- ventilated in setting septic shock  . Dysrhythmia     Afib in 2013 when she had septic shock related to stone ureteral obstruction  . Myocardial infarction [facility])     Was reported in 2013 during hospitalization with septic shock  . Septic shock ([place]) 04/04/2012  . Sepsis ([place])   . Hypertension     medication removed from regimen due to low blood pressure   . Peripheral vascular disease ([place])   . S/P hemodialysis catheter insertion ([place]) 04/11/2015     right anterior chest , only used once   . History of nephrostomy 04/11/2015     currently inplace 04/28/2015   . History of blood transfusion 04/13/2015   . Low iron     hx  . Glaucoma   . History of blood product transfusion   . Coronary artery disease   . Complication of anesthesia     use a little anesthesia , per patient MD states she quit breathing (2016); hard to wake up    Past Surgical History  Procedure Laterality Date  . Cystoscopy w/  ureteral stent placement  04/04/2012    Procedure: CYSTOSCOPY WITH RETROGRADE PYELOGRAM/URETERAL STENT PLACEMENT;  Surgeon: Ailene Rud, MD;  Location: [place];  Service: Urology;  Laterality: Left;  . Total abdominal hysterectomy w/ bilateral salpingoophorectomy  1993    secondary to fibroids  . Breast biopsy Left 08/23/07    benign fibrocystic with duct ectasia  . Cardiac catheterization  07-11-2012  dr Irish Lack    Abnormal stress test/   normal coronary arteries/  LVEDP  23mmHg  . Cardiovascular stress test  06-26-2012  dr Irish Lack    marked ischemia in the basal anterior, mid anterior, apical inferior regions/  normal LVF, ef 63%  . Transthoracic echocardiogram  04-09-2012    normal LVF,  ef 60-65%,  mild LAE,  mild TR, trivial MR and PR  . Cataract extraction w/ intraocular lens  implant, bilateral    . Knee arthroscopy Left 02-14-2003  . Laparoscopic cholecystectomy  03-23-2005  . Extracorporeal shock wave lithotripsy  05-28-2012  &  10-08-2012  . Cystoscopy with stent placement Right 10/28/2014    Procedure: RIGHT URETERAL STENT PLACEMENT;  Surgeon: Irine Seal, MD;  Location:  [place];  Service: Urology;  Laterality: Right;  . Cystoscopy/retrograde/ureteroscopy/stone extraction with basket Right 11/21/2014    Procedure: CYSTOSCOPY/RIGHT RETROGRADE PYELOGRAM/RIGHT URETEROSCOPY/BASKET EXTRACTION/RIGHT PYELOSCOPY/LASER OF STONE/RIGHT DOUBLE J STENT;  Surgeon: Carolan Clines, MD;  Location: [place];  Service: Urology;  Laterality: Right;  . Holmium laser application Right [id number]    Procedure: HOLMIUM LASER APPLICATION;  Surgeon: Carolan Clines, MD;  Location[facility];  Service: Urology;  Laterality: Right;  . Cystoscopy with stent placement Right 02/26/2015    Procedure: CYSTOSCOPY RETROGRADE PYELOGRAM WITH STENT PLACEMENT;  Surgeon: Cleon Gustin, MD;  Location: [facility];  Service: Urology;  Laterality: Right;  .  Cystoscopy w/ ureteral stent placement Bilateral 05/04/2015    Procedure: CYSTOSCOPY WITH BILATERAL RETROGRADE PYELOGRAM/ WITH INTERPRETATION, EXCHANGE OF RIGHT URETERAL STENT REPLACEMENT AND PLACEMENT LEFT URETERAL STENT PLACEMENT EXAMINATION OF VAGINA;  Surgeon: Carolan Clines, MD;  Location: [facility];  Service: Urology;  Laterality: Bilateral;  . Av fistula placement Left 06/02/2015    Procedure: BRACHIOCEPHALIC ARTERIOVENOUS (AV) FISTULA CREATION ;  Surgeon: Conrad [place], MD;  Location: [place];  Service: Vascular;  Laterality: Left;  . Abdominal hysterectomy    . Bascilic vein transposition Left 07/27/2015    Procedure: FIRST STAGE BASILIC VEIN TRANSPOSITION LEFT UPPER ARM;  Surgeon: Conrad , MD;  Location: [place];  Service: Vascular;  Laterality: Left;    Social History   Social History  . Marital Status: Widowed    Spouse Name: N/A  . Number of Children: 1  . Years of Education: N/A   Occupational History  . retired   . legal assistant    Social History Main Topics  . Smoking status: Never Smoker   . Smokeless tobacco: Never Used  . Alcohol Use: No  . Drug Use: No  . Sexual Activity: No     Comment: widow husband passed 5/05 with lung cancer   Other Topics Concern  . Not on file   Social History Narrative    Family History  Problem Relation Age of Onset  . Hypertension Mother   . Cancer Mother 47    breast  . Dementia Mother   . Hypertension Brother   . Diabetes Brother   . Heart disease Brother     before age 6  . Cancer Father 52    pancreatic  . Heart failure Paternal Grandmother   . Bladder Cancer Maternal Grandfather     No current facility-administered medications on file prior to encounter.   Current Outpatient Prescriptions on File Prior to Encounter  Medication Sig Dispense Refill  . acetaminophen (TYLENOL) 500 MG tablet Take 500 mg by mouth every 6 (six) hours as needed for moderate pain or headache.     . bimatoprost (LUMIGAN) 0.01 % SOLN  Place 1 drop into both eyes at bedtime.    . metoprolol tartrate (LOPRESSOR) 25 MG tablet TAKE 1/2 TABLET BY MOUTH TWICE DAILY (Patient taking differently: Take one-half tablet (12.5 mg) by mouth twice daily.) 30 tablet 3  . Nutritional Supplements (FEEDING SUPPLEMENT, NEPRO CARB STEADY,) LIQD Take 237 mLs by mouth daily. 30 Can 0  . ranitidine (ZANTAC) 150 MG tablet Take 150 mg by mouth 2 (two) times daily as needed for heartburn.    . saccharomyces boulardii (FLORASTOR) 250 MG capsule Take 1 capsule (250 mg total) by mouth 2 (two) times daily. 60 capsule 0  . sodium bicarbonate 650 MG tablet Take 1 tablet (650 mg total) by  mouth 2 (two) times daily. 60 tablet 0  . sodium chloride (OCEAN) 0.65 % SOLN nasal spray Place 1 spray into both nostrils 3 (three) times daily as needed for congestion.    . timolol (BETIMOL) 0.5 % ophthalmic solution Place 1 drop into both eyes every morning.       Allergies  Allergen Reactions  . Vicodin [Hydrocodone-Acetaminophen] Nausea And Vomiting  . Chlorhexidine Rash    Sunburn    rash  . Percocet [Oxycodone-Acetaminophen] Nausea And Vomiting    Review of Systems: As listed above, otherwise negative.  Physical Examination  Filed Vitals:   10/12/15 0626  BP: 119/59  Pulse: 73  Temp: 97.8 F (36.6 C)  Resp: 20  Height: 5\' 3"  (1.6 m)  Weight: 178 lb (80.74 kg)  SpO2: 100%    General: A&O x 3, WDWN  Pulmonary: Sym exp, good air movt, CTAB, no rales, rhonchi, & wheezing  Cardiac: RRR, Nl S1, S2, no Murmurs, rubs or gallops  Gastrointestinal: soft, NTND, -G/R, - HSM, - masses, - CVAT B  Musculoskeletal: M/S 5/5 throughout , Extremities without ischemic changes , palpable thrill in L arm  Laboratory See iStat  Medical Decision Making  [name] is a 70 y.o. female who presents with: CKD Stage 3.   The patient is scheduled for: L 2nd stage BVT  Risk, benefits, and alternatives to access surgery were discussed.  The patient is aware  the risks include but are not limited to: bleeding, infection, steal syndrome, nerve damage, ischemic monomelic neuropathy, failure to mature, and need for additional procedures.  The patient is aware of the risks and agrees to proceed.  Adele Barthel, MD [facility] [place] Office: [phone] Pager: [phone]  10/12/2015, 7:22 AM

## 2015-10-12 NOTE — Transfer of Care (Signed)
Immediate Anesthesia Transfer of Care Note  Patient: [name]  Procedure(s) Performed: Procedure(s): SECOND STAGE BASILIC VEIN TRANSPOSITION LEFT ARM (Left)  Patient Location: PACU  Anesthesia Type:General  Level of Consciousness: awake and patient cooperative  Airway & Oxygen Therapy: Patient Spontanous Breathing and Patient connected to nasal cannula oxygen  Post-op Assessment: Report given to RN, Post -op Vital signs reviewed and stable and Patient moving all extremities  Post vital signs: Reviewed and stable  Last Vitals:  Filed Vitals:   10/12/15 0626 10/12/15 0930  BP: 119/59   Pulse: 73   Temp: 36.6 C 36.3 C  Resp: 20     Complications: No apparent anesthesia complications

## 2015-10-12 NOTE — Op Note (Signed)
    OPERATIVE NOTE   PROCEDURE: left second stage basilic vein transposition (brachiobasilic arteriovenous fistula) placement  PRE-OPERATIVE DIAGNOSIS: chronic kidney disease stage III   POST-OPERATIVE DIAGNOSIS: same as above   SURGEON: Adele Barthel, MD  ASSISTANT(S): Leontine Locket, PAC   ANESTHESIA: general  ESTIMATED BLOOD LOSS: 50 cc  FINDING(S): 1.  Fistula >6 mm most of fistula except for cubital vein (4-5 mm) 2.  Strong thrill throughout  SPECIMEN(S):  none  INDICATIONS:   [name] is a 70 y.o. female who presents with chronic kidney disease stage 3 s/p failed left brachiocephalic arteriovenous fistula and s/p successful first stage basilic vein transposition.  The patient is scheduled for left second stage basilic vein transposition.  The patient is aware the risks include but are not limited to: bleeding, infection, steal syndrome, nerve damage, ischemic monomelic neuropathy, failure to mature, and need for additional procedures.  The patient is aware of the risks of the procedure and elects to proceed forward.  DESCRIPTION: After full informed written consent was obtained from the patient, the patient was brought back to the operating room and placed supine upon the operating table.  Prior to induction, the patient received IV antibiotics.   After obtaining adequate anesthesia, the patient was then prepped and draped in the standard fashion for a left arm access procedure.  I turned my attention first to identifying the patient's brachiobasilic arteriovenous fistula.  Using SonoSite guidance, the location of this fistula was marked out on the skin.   At this point, I injected local anesthetic to obtain a field block of the antecubitum.  In total, I injected about 30 mL of a 1% lidocaine without epinephrine.  I made an longitudinal incision over the fistula from its arterial anastomosis up to its axillary extent.  I carefully dissected the fistula away from its adjacent  nerves, though the median antecubital sensory nerve draped over the fistula.  I had to sharply dissect out the nerve distally to free up the nerve from tension on the fistula.  Eventually the entirety of this fistula was mobilized and I dissected a plane on top of the bicipital fascia with electrocautery.  The fistula was secured in its new location with loosely placed interrupted 3-0 Vicryl stitches tied to side branches and the fascia.  The deep subcutaneous tissue was inspected for bleeding.  Bleeding was controlled with electrocautery and placement of large pieces of thrombin and gelfoam.  I washed out the surgical site after waiting a few minutes, and there was no further bleeding.  The fascia was reapproximated with interrupted stitches of 3-0 Vicryl to eliminate some of the deep space.  The superficial subcutaneous tissue was then reapproximated along the incision line with a running stitch of 3-0 Vicryl.  The skin was then reapproximated with a running subcuticular of 4-0 Monocryl.  The skin was then cleaned, dried, and reinforced with Dermabond.  The patient tolerated this procedure well.    COMPLICATIONS: none  CONDITION: stable  Adele Barthel, MD [facility] [place] Office: [phone] Pager: [phone]  10/12/2015, 9:13 AM

## 2015-10-12 NOTE — Telephone Encounter (Signed)
-----   Message from Mena Goes, RN sent at 10/12/2015  9:31 AM EDT ----- Regarding: schedule   ----- Message -----    From: Gabriel Earing, PA-C    Sent: 10/12/2015   9:18 AM      To: Vvs Charge Pool  S/p 2nd stage BVT 10/12/15.  F/u with [facility]LC in 4 weeks.  No duplex.  Thanks, Aldona Bar

## 2015-10-12 NOTE — Progress Notes (Signed)
   Daily Progress Note  Pt is being admitted overnight at the request of the family for overnight observation in accordance with [facility] policy.  Adele Barthel, MD [facility] [place] Office: [phone] Pager: [phone]  10/12/2015, 11:13 AM

## 2015-10-12 NOTE — Anesthesia Preprocedure Evaluation (Addendum)
Anesthesia Evaluation  Patient identified by MRN, date of birth, ID band Patient awake    Reviewed: Allergy & Precautions, NPO status , Patient's Chart, lab work & pertinent test results  Airway Mallampati: II  TM Distance: >3 FB Neck ROM: Full    Dental  (+) Teeth Intact, Dental Advisory Given   Pulmonary    breath sounds clear to auscultation       Cardiovascular hypertension, Pt. on home beta blockers and Pt. on medications + CAD, + Past MI and + Peripheral Vascular Disease  + dysrhythmias  Rhythm:Regular Rate:Normal     Neuro/Psych  Headaches,    GI/Hepatic hiatal hernia, GERD  Controlled and Medicated,  Endo/Other    Renal/GU ESRF and DialysisRenal diseasePt not yet on HD, had one cycle during sepsis episode in 2013. Also, she has a right nephrostomy bag.     Musculoskeletal   Abdominal   Peds  Hematology  (+) anemia ,   Anesthesia Other Findings   Reproductive/Obstetrics                            Anesthesia Physical Anesthesia Plan  ASA: III  Anesthesia Plan: General and MAC   Post-op Pain Management:    Induction: Intravenous  Airway Management Planned: LMA  Additional Equipment:   Intra-op Plan:   Post-operative Plan: Extubation in OR  Informed Consent: I have reviewed the patients History and Physical, chart, labs and discussed the procedure including the risks, benefits and alternatives for the proposed anesthesia with the patient or authorized representative who has indicated his/her understanding and acceptance.   Dental advisory given  Plan Discussed with: CRNA and Surgeon  Anesthesia Plan Comments:         Anesthesia Quick Evaluation

## 2015-10-12 NOTE — Discharge Instructions (Signed)
° ° °  10/12/2015 [name] [id number] [birth year]  Surgeon(s): Conrad [place]uff, MD  Procedure(s): SECOND STAGE BASILIC VEIN TRANSPOSITION LEFT ARM  x Do not stick fistula for 6 weeks

## 2015-10-12 NOTE — Care Management Note (Signed)
Case Management Note  Patient Details  Name: GALIA DITOMASO MRN: [id number] Date of Birth: 09-Dec-1945  Subjective/Objective:         left second stage basilic vein transposition (brachiobasilic arteriovenous fistula) placement            Action/Plan: Discharge Planning: NCM spoke to pt at bedside. States she lives with dtr and is independent at home. She can afford her medications. No NCM needs identified.   PCP- Lajean Manes MD   Expected Discharge Date:  10/12/2015               Expected Discharge Plan:  Home/Self Care  In-House Referral:  NA  Discharge planning Services  CM Consult  Post Acute Care Choice:  NA Choice offered to:  NA  DME Arranged:  N/A DME Agency:  NA  HH Arranged:  NA HH Agency:  NA  Status of Service:  Completed, signed off  Medicare Important Message Given:    Date Medicare IM Given:    Medicare IM give by:    Date Additional Medicare IM Given:    Additional Medicare Important Message give by:     If discussed at [place] of Stay Meetings, dates discussed:    Additional Comments:  Erenest Rasher, RN 10/12/2015, 2:11 PM

## 2015-10-12 NOTE — Anesthesia Postprocedure Evaluation (Signed)
Anesthesia Post Note  Patient: [name]  Procedure(s) Performed: Procedure(s) (LRB): SECOND STAGE BASILIC VEIN TRANSPOSITION LEFT ARM (Left)  Patient location during evaluation: PACU Anesthesia Type: General Level of consciousness: awake and alert Pain management: pain level controlled Vital Signs Assessment: post-procedure vital signs reviewed and stable Respiratory status: spontaneous breathing, nonlabored ventilation, respiratory function stable and patient connected to nasal cannula oxygen Cardiovascular status: blood pressure returned to baseline and stable Postop Assessment: no signs of nausea or vomiting Anesthetic complications: no    Last Vitals:  Filed Vitals:   10/12/15 1000 10/12/15 1015  BP: 111/59 115/60  Pulse: 73 72  Temp:  36.3 C  Resp: 8 15    Last Pain:  Filed Vitals:   10/12/15 1017  PainSc: 4                  Kada Friesen,JAMES TERRILL

## 2015-10-13 ENCOUNTER — Encounter (HOSPITAL_COMMUNITY): Payer: Self-pay | Admitting: General Practice

## 2015-10-13 DIAGNOSIS — I132 Hypertensive heart and chronic kidney disease with heart failure and with stage 5 chronic kidney disease, or end stage renal disease: Secondary | ICD-10-CM | POA: Diagnosis not present

## 2015-10-13 NOTE — Discharge Summary (Signed)
Discharge Summary    [name] [birth year] 70 y.o. female  [id number]  Admission Date: 10/12/2015  Discharge Date: 10/13/15  Physician: Conrad Savanna, MD  Admission Diagnosis: Stage V Chronic Kidney Disease N18.5   HPI:   This is a 70 y.o. female who presents with chief complaint: chronic kidney disease stage 3. The patient presents today for L 2nd stage BVT.  Hospital Course:  The patient was admitted to the hospital and taken to the operating room on 10/12/2015 and underwent: left second stage basilic vein transposition (brachiobasilic arteriovenous fistula) placement    The pt tolerated the procedure well and was transported to the PACU in good condition.   She was admitted for observation as she did not have anyone to stay with her.  By POD 1, she is doing well.  Her fistula has a good thrill.  She does not have any evidence of steal.  The remainder of the hospital course consisted of increasing mobilization and increasing intake of solids without difficulty.  CBC    Component Value Date/Time   WBC 12.1* 09/28/2015 0439   RBC 3.19* 09/28/2015 0439   RBC 2.71* 04/08/2015 0915   HGB 11.6* 10/12/2015 0645   HGB 13.6 09/16/2013 1129   HCT 34.0* 10/12/2015 0645   HCT 23.6* 04/08/2015 0545   PLT 279 09/28/2015 0439   MCV 82.1 09/28/2015 0439   MCH 26.6 09/28/2015 0439   MCHC 32.4 09/28/2015 0439   RDW 16.9* 09/28/2015 0439   LYMPHSABS 0.4* 09/25/2015 1037   MONOABS 0.6 09/25/2015 1037   EOSABS 0.0 09/25/2015 1037   BASOSABS 0.0 09/25/2015 1037    BMET    Component Value Date/Time   NA 139 10/12/2015 0645   K 5.1 10/12/2015 0645   CL 109 10/01/2015 1030   CO2 17* 10/01/2015 1030   GLUCOSE 100* 10/12/2015 0645   BUN 71* 10/01/2015 1030   CREATININE 6.43* 10/01/2015 1030   CALCIUM 8.7* 10/01/2015 1030   GFRNONAA 6* 10/01/2015 1030   GFRAA 7* 10/01/2015 1030      Discharge Instructions    Call MD for:  redness, tenderness, or signs of infection  (pain, swelling, bleeding, redness, odor or green/yellow discharge around incision site)    Complete by:  As directed      Call MD for:  severe or increased pain, loss or decreased feeling  in affected limb(s)    Complete by:  As directed      Call MD for:  temperature >100.5    Complete by:  As directed      Discharge wound care:    Complete by:  As directed   Shower daily with soap and water starting 10/14/15     Driving Restrictions    Complete by:  As directed   No driving for 24 hours and while taking pain medication.     Lifting restrictions    Complete by:  As directed   No lifting for 3 weeks     Resume previous diet    Complete by:  As directed            Discharge Diagnosis:  Stage V Chronic Kidney Disease N18.5  Secondary Diagnosis: Patient Active Problem List   Diagnosis Date Noted  . CKD (chronic kidney disease) stage 3, GFR 30-59 ml/min 10/12/2015  . Pyelonephritis 09/25/2015  . Sepsis ([place]) 09/25/2015  . CKD (chronic kidney disease) stage 5, GFR less than 15 ml/min (HCC) 09/25/2015  . Sepsis secondary to UTI ([place])  08/14/2015  . Anemia of renal disease 08/14/2015  . Severe protein-calorie malnutrition ([place]) 05/27/2015  . End stage renal disease ([place])   . Acute renal failure superimposed on stage 4 chronic kidney disease ([place])   . Normocytic anemia 11/23/2014  . Essential hypertension, benign 08/14/2013  . Glaucoma    Past Medical History  Diagnosis Date  . GERD (gastroesophageal reflux disease)   . Glaucoma   . H/O renal calculi 2002 & 2006  . H/O hiatal hernia   . Headache(784.0)     migraine  . Pneumonia     dx 10-06-2014 per CXR--  on 10-27-2014 pt states finished antibiotic and denies cough or fever  . Hyperlipidemia   . History of MI (myocardial infarction)     10/ 2013 in setting of Septic Shock  . History of atrial fibrillation     10/ 2013  in setting of Septic Shock  . History of CHF (congestive heart failure)     10/ 2013 in setting of  septic shock  . Sigmoid diverticulosis   . Nephrolithiasis     bilateral  . CKD (chronic kidney disease), stage III     nephrologist-  dr Florene Glen  . Right ureteral stone   . History of acute respiratory failure     10/ 2013  -- ventilated in setting septic shock  . Dysrhythmia     Afib in 2013 when she had septic shock related to stone ureteral obstruction  . Myocardial infarction [facility])     Was reported in 2013 during hospitalization with septic shock  . Septic shock ([place]) 04/04/2012  . Sepsis ([place])   . Hypertension     medication removed from regimen due to low blood pressure   . Peripheral vascular disease ([place])   . S/P hemodialysis catheter insertion ([place]) 04/11/2015     right anterior chest , only used once   . History of nephrostomy 04/11/2015     currently inplace 04/28/2015   . History of blood transfusion 04/13/2015   . Low iron     hx  . Glaucoma   . History of blood product transfusion   . Coronary artery disease   . Complication of anesthesia     use a little anesthesia , per patient MD states she quit breathing (2016); hard to wake up       Medication List    TAKE these medications        acetaminophen 500 MG tablet  Commonly known as:  TYLENOL  Take 500 mg by mouth every 6 (six) hours as needed for moderate pain or headache.     bimatoprost 0.01 % Soln  Commonly known as:  LUMIGAN  Place 1 drop into both eyes at bedtime.     feeding supplement (NEPRO CARB STEADY) Liqd  Take 237 mLs by mouth daily.     metoprolol tartrate 25 MG tablet  Commonly known as:  LOPRESSOR  TAKE 1/2 TABLET BY MOUTH TWICE DAILY     NYAMYC 100000 UNIT/GM Powd  Apply 1 application topically 2 (two) times daily.     ranitidine 150 MG tablet  Commonly known as:  ZANTAC  Take 150 mg by mouth 2 (two) times daily as needed for heartburn.     saccharomyces boulardii 250 MG capsule  Commonly known as:  FLORASTOR  Take 1 capsule (250 mg total) by mouth 2 (two) times daily.      sodium bicarbonate 650 MG tablet  Take 1 tablet (650 mg total)  by mouth 2 (two) times daily.     sodium chloride 0.65 % Soln nasal spray  Commonly known as:  OCEAN  Place 1 spray into both nostrils 3 (three) times daily as needed for congestion.     timolol 0.5 % ophthalmic solution  Commonly known as:  BETIMOL  Place 1 drop into both eyes every morning.     traMADol 50 MG tablet  Commonly known as:  ULTRAM  Take 1 tablet (50 mg total) by mouth every 6 (six) hours as needed.        Prescriptions given: Ultram #20 No Refill  Disposition: home  Patient's condition: is Good  Follow up: 1. Dr. Bridgett Larsson in 4 weeks   Leontine Locket, PA-C [facility] [phone] 10/13/2015  7:26 AM   Addendum  I have independently interviewed and examined the patient, and I agree with the physician assistant's discharge summary.  This patient had an uneventful left second stage basilic vein transposition.  She will follow up in the office in 4 weeks for a wound check.  Adele Barthel, MD [facility] [place] Office: [phone] Pager: [phone]  10/14/2015, 2:42 PM

## 2015-10-13 NOTE — Progress Notes (Signed)
Patient and family given discharge instructions medication list and paper prescription, Patient given follow up appointments also. And Vascular site care discharge instructions. All questions were answered will discharge home as ordered. Ramondo Dietze, Bettina Gavia RN

## 2015-10-13 NOTE — Progress Notes (Signed)
  Postoperative hemodialysis access     Date of Surgery:  10/12/15 Surgeon: Bridgett Larsson  Subjective:  C/o soreness over fistula  PHYSICAL EXAMINATION:  Filed Vitals:   10/13/15 0228 10/13/15 0543  BP: 119/47 108/50  Pulse: 73 82  Temp: 97.9 F (36.6 C) 98 F (36.7 C)  Resp: 18 18    Incision is clean and dry Sensation in digits is intact;  There is  Thrill  The graft/fistula is palpable    ASSESSMENT/PLAN:  [name] is a 70 y.o. year old female who is s/p left second stage basilic vein transposition (brachiobasilic arteriovenous fistula) placement.  -graft/fistula is patent -pt does not have evidence of steal sx -f/u with Dr. Bridgett Larsson in 4 weeks to check maturation of AVF    Leontine Locket, PA-C [facility] [phone]

## 2015-10-15 ENCOUNTER — Encounter (HOSPITAL_COMMUNITY)
Admission: RE | Admit: 2015-10-15 | Discharge: 2015-10-15 | Disposition: A | Discharge status: Home / Self Care | Payer: Medicare Other | Source: Ambulatory Visit | Attending: Nephrology | Admitting: Nephrology

## 2015-10-15 DIAGNOSIS — N184 Chronic kidney disease, stage 4 (severe): Secondary | ICD-10-CM | POA: Diagnosis not present

## 2015-10-16 ENCOUNTER — Ambulatory Visit: Payer: Medicare Other | Admitting: Nurse Practitioner

## 2015-10-22 ENCOUNTER — Other Ambulatory Visit (HOSPITAL_COMMUNITY): Payer: Self-pay | Admitting: *Deleted

## 2015-10-23 ENCOUNTER — Encounter (HOSPITAL_COMMUNITY)
Admission: RE | Admit: 2015-10-23 | Discharge: 2015-10-23 | Disposition: A | Discharge status: Home / Self Care | Payer: Medicare Other | Source: Ambulatory Visit | Attending: Nephrology | Admitting: Nephrology

## 2015-10-23 DIAGNOSIS — N184 Chronic kidney disease, stage 4 (severe): Secondary | ICD-10-CM | POA: Diagnosis not present

## 2015-10-23 DIAGNOSIS — Z5181 Encounter for therapeutic drug level monitoring: Secondary | ICD-10-CM | POA: Insufficient documentation

## 2015-10-23 DIAGNOSIS — Z79899 Other long term (current) drug therapy: Secondary | ICD-10-CM | POA: Diagnosis not present

## 2015-10-23 DIAGNOSIS — D631 Anemia in chronic kidney disease: Secondary | ICD-10-CM | POA: Insufficient documentation

## 2015-10-23 MED ORDER — HEPARIN SODIUM (PORCINE) 1000 UNIT/ML IJ SOLN
3600.0000 [IU] | Freq: Once | INTRAMUSCULAR | Status: AC
  Administered 2015-10-23: 3200 [IU] via INTRAVENOUS
  Filled 2015-10-23: qty 3.6

## 2015-10-28 ENCOUNTER — Ambulatory Visit (INDEPENDENT_AMBULATORY_CARE_PROVIDER_SITE_OTHER): Payer: Medicare Other | Admitting: Nurse Practitioner

## 2015-10-28 ENCOUNTER — Encounter: Payer: Self-pay | Admitting: Nurse Practitioner

## 2015-10-28 VITALS — BP 116/60 | HR 80 | Ht 62.5 in | Wt 174.0 lb

## 2015-10-28 DIAGNOSIS — Z0000 Encounter for general adult medical examination without abnormal findings: Secondary | ICD-10-CM

## 2015-10-28 DIAGNOSIS — Z01419 Encounter for gynecological examination (general) (routine) without abnormal findings: Secondary | ICD-10-CM | POA: Diagnosis not present

## 2015-10-28 MED ORDER — NYSTATIN 100000 UNIT/GM EX CREA: 1.0000 "application " | TOPICAL_CREAM | Freq: Two times a day (BID) | CUTANEOUS | Status: DC

## 2015-10-28 MED ORDER — TRIAMCINOLONE ACETONIDE 0.025 % EX OINT: 1.0000 "application " | TOPICAL_OINTMENT | Freq: Two times a day (BID) | CUTANEOUS | Status: DC

## 2015-10-28 MED ORDER — FLUCONAZOLE 150 MG PO TABS: ORAL_TABLET | ORAL | Status: DC

## 2015-10-28 NOTE — Progress Notes (Signed)
Patient ID: [name], female   DOB: [birth year], 70 y.o.   MRN: [id number]  70 y.o. G59P1001 Widowed Caucasian Fe here for annual exam.  Since last here she has had 12 hospital admissions for various events. She is now in acute renal failure stage IV -V.   She has had right CV line insertion 04/10/15 (internal jugular permanent hemodynamic catheter).  She also has Basilic vein transposition 10/12/15  left arm in preparation for future renal dialysis if that becomes necessary.  She has had urosepsis, septic shock in 2013 with ventilation and A fib, MRSA, C diff, etc.  Right nephrostomy tube inserted several times; this one now for over a month and gets changed every 6 weeks.  Now followed by PCP, nephrologist and urologist.  She feels OK in general.  She really misses her part time job at [employer] as she was able to be with someone and felt that she was helping.  At times feels overwhelmed.  She does have a current problem with yeast infection under left breast and in the perineum.  She does occasionally have urine leakage and therefore wears a pad daily.  She has used Nystatin powder prn under the breast.  Patient's last menstrual period was 01/19/1992 (approximate).          Sexually active: No.  The current method of family planning is none and abstinence.    Exercising: Yes.    walking Smoker:  no  Health Maintenance: Pap: 07/31/07, Negative MMG: 10/02/14, 3D, Bi-Rads 1: Negative - unable to get a new Mammo due to placement of catheter. Colonoscopy:  01/18/10, negative, repeat 5 years BMD: 10/02/14 T Score: 0.5 Spine / -2.0 Right Femur Neck / -1.6 Left Femur Neck / -2.2 Left Radius TDaP:  2010 Shingles: 2012 Pneumonia: 2015, 2012 Hep C and HIV: not done today Labs: in EPIC   reports that she has never smoked. She has never used smokeless tobacco. She reports that she does not drink alcohol or use illicit drugs.  Past Medical History  Diagnosis Date  . GERD (gastroesophageal reflux disease)    . Glaucoma   . H/O renal calculi 2002 & 2006  . H/O hiatal hernia   . Headache(784.0)     migraine  . Pneumonia     dx 10-06-2014 per CXR--  on 10-27-2014 pt states finished antibiotic and denies cough or fever  . Hyperlipidemia   . History of MI (myocardial infarction)     10/ 2013 in setting of Septic Shock  . History of atrial fibrillation     10/ 2013  in setting of Septic Shock  . History of CHF (congestive heart failure)     10/ 2013 in setting of septic shock  . Sigmoid diverticulosis   . Nephrolithiasis     bilateral  . CKD (chronic kidney disease), stage III     nephrologist-  dr Florene Glen  . Right ureteral stone   . History of acute respiratory failure     10/ 2013  -- ventilated in setting septic shock  . Dysrhythmia     Afib in 2013 when she had septic shock related to stone ureteral obstruction  . Myocardial infarction [facility])     Was reported in 2013 during hospitalization with septic shock  . Septic shock ([place]) 04/04/2012  . Sepsis ([place])   . Hypertension     medication removed from regimen due to low blood pressure   . Peripheral vascular disease ([place])   . S/P  hemodialysis catheter insertion ([place]) 04/11/2015     right anterior chest , only used once   . History of nephrostomy 04/11/2015     currently inplace 04/28/2015   . History of blood transfusion 04/13/2015   . Low iron     hx  . Glaucoma   . History of blood product transfusion   . Coronary artery disease   . Complication of anesthesia     use a little anesthesia , per patient MD states she quit breathing (2016); hard to wake up    Past Surgical History  Procedure Laterality Date  . Cystoscopy w/ ureteral stent placement  04/04/2012    Procedure: CYSTOSCOPY WITH RETROGRADE PYELOGRAM/URETERAL STENT PLACEMENT;  Surgeon: Ailene Rud, MD;  Location: [place];  Service: Urology;  Laterality: Left;  . Total abdominal hysterectomy w/ bilateral salpingoophorectomy  1993    secondary to fibroids  .  Breast biopsy Left 08/23/07    benign fibrocystic with duct ectasia  . Cardiac catheterization  07-11-2012  dr Irish Lack    Abnormal stress test/   normal coronary arteries/  LVEDP  6mmHg  . Cardiovascular stress test  06-26-2012  dr Irish Lack    marked ischemia in the basal anterior, mid anterior, apical inferior regions/  normal LVF, ef 63%  . Transthoracic echocardiogram  04-09-2012    normal LVF,  ef 60-65%,  mild LAE,  mild TR, trivial MR and PR  . Cataract extraction w/ intraocular lens  implant, bilateral    . Knee arthroscopy Left 02-14-2003  . Laparoscopic cholecystectomy  03-23-2005  . Extracorporeal shock wave lithotripsy  05-28-2012  &  10-08-2012  . Cystoscopy with stent placement Right 10/28/2014    Procedure: RIGHT URETERAL STENT PLACEMENT;  Surgeon: Irine Seal, MD;  Location: [facility];  Service: Urology;  Laterality: Right;  . Cystoscopy/retrograde/ureteroscopy/stone extraction with basket Right 11/21/2014    Procedure: CYSTOSCOPY/RIGHT RETROGRADE PYELOGRAM/RIGHT URETEROSCOPY/BASKET EXTRACTION/RIGHT PYELOSCOPY/LASER OF STONE/RIGHT DOUBLE J STENT;  Surgeon: Carolan Clines, MD;  Location: [place];  Service: Urology;  Laterality: Right;  . Holmium laser application Right [id number]    Procedure: HOLMIUM LASER APPLICATION;  Surgeon: Carolan Clines, MD;  Location: [facility];  Service: Urology;  Laterality: Right;  . Cystoscopy with stent placement Right 02/26/2015    Procedure: CYSTOSCOPY RETROGRADE PYELOGRAM WITH STENT PLACEMENT;  Surgeon: Cleon Gustin, MD;  Location: [facility];  Service: Urology;  Laterality: Right;  . Cystoscopy w/ ureteral stent placement Bilateral 05/04/2015    Procedure: CYSTOSCOPY WITH BILATERAL RETROGRADE PYELOGRAM/ WITH INTERPRETATION, EXCHANGE OF RIGHT URETERAL STENT REPLACEMENT AND PLACEMENT LEFT URETERAL STENT PLACEMENT EXAMINATION OF VAGINA;  Surgeon: Carolan Clines, MD;  Location: [facility];   Service: Urology;  Laterality: Bilateral;  . Av fistula placement Left 06/02/2015    Procedure: BRACHIOCEPHALIC ARTERIOVENOUS (AV) FISTULA CREATION ;  Surgeon: Conrad Derby, MD;  Location: [place];  Service: Vascular;  Laterality: Left;  . Abdominal hysterectomy    . Bascilic vein transposition Left 07/27/2015    Procedure: FIRST STAGE BASILIC VEIN TRANSPOSITION LEFT UPPER ARM;  Surgeon: Conrad Whittemore, MD;  Location: [place];  Service: Vascular;  Laterality: Left;  . Bascilic vein transposition Left 09/2015    second phase  . Bascilic vein transposition Left 10/12/2015    Procedure: SECOND STAGE BASILIC VEIN TRANSPOSITION LEFT ARM;  Surgeon: Conrad Mission Woods, MD;  Location: [place];  Service: Vascular;  Laterality: Left;    Current Outpatient Prescriptions  Medication Sig Dispense  Refill  . acetaminophen (TYLENOL) 500 MG tablet Take 500 mg by mouth every 6 (six) hours as needed for moderate pain or headache.     . bimatoprost (LUMIGAN) 0.01 % SOLN Place 1 drop into both eyes at bedtime.    . metoprolol tartrate (LOPRESSOR) 25 MG tablet TAKE 1/2 TABLET BY MOUTH TWICE DAILY (Patient taking differently: Take one-half tablet (12.5 mg) by mouth twice daily.) 30 tablet 3  . Nutritional Supplements (FEEDING SUPPLEMENT, NEPRO CARB STEADY,) LIQD Take 237 mLs by mouth daily. 30 Can 0  . Nystatin (NYAMYC) 100000 UNIT/GM POWD Apply 1 application topically 2 (two) times daily.   0  . ranitidine (ZANTAC) 150 MG tablet Take 150 mg by mouth 2 (two) times daily as needed for heartburn.    . saccharomyces boulardii (FLORASTOR) 250 MG capsule Take 1 capsule (250 mg total) by mouth 2 (two) times daily. 60 capsule 0  . sodium bicarbonate 650 MG tablet Take 1 tablet (650 mg total) by mouth 2 (two) times daily. (Patient taking differently: Take 650 mg by mouth 3 (three) times daily. ) 60 tablet 0  . sodium chloride (OCEAN) 0.65 % SOLN nasal spray Place 1 spray into both nostrils 3 (three) times daily as needed for congestion.    .  timolol (BETIMOL) 0.5 % ophthalmic solution Place 1 drop into both eyes every morning.     . traMADol (ULTRAM) 50 MG tablet Take 1 tablet (50 mg total) by mouth every 6 (six) hours as needed. 20 tablet 0  . nystatin cream (MYCOSTATIN) Apply 1 application topically 2 (two) times daily. Apply to affected area BID for up to 7 days. 30 g 3  . triamcinolone (KENALOG) 0.025 % ointment Apply 1 application topically 2 (two) times daily. 30 g 3   No current facility-administered medications for this visit.    Family History  Problem Relation Age of Onset  . Hypertension Mother   . Cancer Mother 61    breast  . Dementia Mother   . Hypertension Brother   . Diabetes Brother   . Heart disease Brother     before age 67  . Cancer Father 60    pancreatic  . Heart failure Paternal Grandmother   . Bladder Cancer Maternal Grandfather     ROS:  Pertinent items are noted in HPI.  Otherwise, a comprehensive ROS was negative.  Exam:   BP 116/60 mmHg  Pulse 80  Ht 5' 2.5" (1.588 m)  Wt 174 lb (78.926 kg)  BMI 31.30 kg/m2  LMP 01/19/1992 (Approximate) Height: 5' 2.5" (158.8 cm) Ht Readings from Last 3 Encounters:  10/28/15 5' 2.5" (1.588 m)  10/23/15 5\' 3"  (1.6 m)  10/15/15 5\' 3"  (1.6 m)    General appearance: alert, cooperative and appears stated age Head: Normocephalic, without obvious abnormality, atraumatic Neck: no adenopathy, supple, symmetrical, trachea midline and thyroid normal to inspection and palpation Lungs: clear to auscultation bilaterally Breasts: normal appearance, no masses or tenderness, red rash noted under left breast.  Above the right breast is a fairly large CV line with a double lumen. Heart: regular rate and rhythm Abdomen: soft, non-tender; no masses,  no organomegaly.  There is a dressing and a right tephrostomy tube with about 30 cc urine in the bag. Extremities: extremities normal, atraumatic, no cyanosis or edema.  Left arm with recent vein transposition  surgery Skin: Skin color, texture, turgor normal. No rashes or lesions Lymph nodes: Cervical, supraclavicular, and axillary nodes normal. No abnormal inguinal nodes  palpated Neurologic: Grossly normal   Pelvic: External genitalia: multiple area of fungal infections from the groin, suprapubic area down to the rectum.                   Urethra:  normal appearing urethra with no masses, tenderness or lesions              Bartholin's and Skene's: normal                 Vagina: normal appearing vagina with normal color and discharge, no lesions              Cervix: absent              Pap taken: No. Bimanual Exam:  Uterus:  uterus absent              Adnexa: no mass, fullness, tenderness               Rectovaginal: Confirms               Anus:  normal sphincter tone, no lesions  Chaperone present: yes  A:  Well Woman with normal exam  Postmenopausal on ERT 1993 - fall 2014 S/P TAH/ BSO secondary to fibroids 1993 History of renal calculi with Lithotripsy X 2 New diagnosis of CKD stage IV/V,  CV line and basilic vein transposition left arm for future renal dialysis  New insertion of right nephrostomy tube with a change every 6 weeks Yeast vulva vaginitis and under left breast    P:   Reviewed health and wellness pertinent to exam  Pap smear as above  Will have her to use Nystatin Powder under the left breast prn  Would like to start her on Diflucan 150 mg X 3 doses if allowed by Nephrologist.  Dr. Abel Presto office is called and they will consult with him and get back to [name] today.  She is given Triamcinolone and Nystatin that she can mix together and apply topically  Emotional support is given  Counseled on breast self exam, adequate intake of calcium and vitamin D, diet and exercise return annually or prn  An After Visit Summary was printed and given to the patient.   Per Mechele Claude at Dr. Abel Presto office [place] for  Diflucan

## 2015-10-28 NOTE — Patient Instructions (Signed)

## 2015-10-29 ENCOUNTER — Encounter (HOSPITAL_COMMUNITY)
Admission: RE | Admit: 2015-10-29 | Discharge: 2015-10-29 | Disposition: A | Discharge status: Home / Self Care | Payer: Medicare Other | Source: Ambulatory Visit | Attending: Nephrology | Admitting: Nephrology

## 2015-10-29 DIAGNOSIS — D631 Anemia in chronic kidney disease: Secondary | ICD-10-CM | POA: Diagnosis not present

## 2015-10-29 DIAGNOSIS — N184 Chronic kidney disease, stage 4 (severe): Secondary | ICD-10-CM | POA: Diagnosis not present

## 2015-10-29 DIAGNOSIS — Z79899 Other long term (current) drug therapy: Secondary | ICD-10-CM | POA: Diagnosis not present

## 2015-10-29 DIAGNOSIS — Z5181 Encounter for therapeutic drug level monitoring: Secondary | ICD-10-CM | POA: Diagnosis not present

## 2015-10-29 LAB — RENAL FUNCTION PANEL
Albumin: 2.4 g/dL — ABNORMAL LOW (ref 3.5–5.0)
Anion gap: 14 (ref 5–15)
BUN: 89 mg/dL — ABNORMAL HIGH (ref 6–20)
CO2: 19 mmol/L — ABNORMAL LOW (ref 22–32)
Calcium: 9.2 mg/dL (ref 8.9–10.3)
Chloride: 105 mmol/L (ref 101–111)
Creatinine, Ser: 8.35 mg/dL — ABNORMAL HIGH (ref 0.44–1.00)
GFR calc Af Amer: 5 mL/min — ABNORMAL LOW (ref >60)
GFR calc non Af Amer: 4 mL/min — ABNORMAL LOW (ref >60)
Glucose, Bld: 118 mg/dL — ABNORMAL HIGH (ref 65–99)
Phosphorus: 6 mg/dL — ABNORMAL HIGH (ref 2.5–4.6)
Potassium: 5 mmol/L (ref 3.5–5.1)
Sodium: 138 mmol/L (ref 135–145)

## 2015-10-29 LAB — FERRITIN: Ferritin: 312 ng/mL — ABNORMAL HIGH (ref 11–307)

## 2015-10-29 LAB — IRON AND TIBC
Iron: 41 ug/dL (ref 28–170)
Saturation Ratios: 23 % (ref 10.4–31.8)
TIBC: 175 ug/dL — ABNORMAL LOW (ref 250–450)
UIBC: 134 ug/dL

## 2015-10-29 LAB — POCT HEMOGLOBIN-HEMACUE: Hemoglobin: 10 g/dL — ABNORMAL LOW (ref 12.0–15.0)

## 2015-10-29 MED ORDER — DARBEPOETIN ALFA 200 MCG/0.4ML IJ SOSY
PREFILLED_SYRINGE | INTRAMUSCULAR | Status: AC
  Administered 2015-10-29: 200 ug via SUBCUTANEOUS
  Filled 2015-10-29: qty 0.4

## 2015-10-29 MED ORDER — DARBEPOETIN ALFA 200 MCG/0.4ML IJ SOSY: 200.0000 ug | PREFILLED_SYRINGE | INTRAMUSCULAR | Status: DC

## 2015-10-30 ENCOUNTER — Encounter (HOSPITAL_COMMUNITY): Payer: Self-pay | Admitting: Emergency Medicine

## 2015-10-30 ENCOUNTER — Inpatient Hospital Stay (HOSPITAL_COMMUNITY)
Admission: EM | Admit: 2015-10-30 | Discharge: 2015-11-11 | DRG: 698 | Disposition: A | Discharge status: Home / Self Care | Payer: Medicare Other | Attending: Internal Medicine | Admitting: Internal Medicine

## 2015-10-30 DIAGNOSIS — T83512A Infection and inflammatory reaction due to nephrostomy catheter, initial encounter: Secondary | ICD-10-CM | POA: Diagnosis not present

## 2015-10-30 DIAGNOSIS — I4891 Unspecified atrial fibrillation: Secondary | ICD-10-CM | POA: Diagnosis not present

## 2015-10-30 DIAGNOSIS — A4152 Sepsis due to Pseudomonas: Secondary | ICD-10-CM | POA: Diagnosis present

## 2015-10-30 DIAGNOSIS — Z87440 Personal history of urinary (tract) infections: Secondary | ICD-10-CM

## 2015-10-30 DIAGNOSIS — D631 Anemia in chronic kidney disease: Secondary | ICD-10-CM | POA: Diagnosis present

## 2015-10-30 DIAGNOSIS — N185 Chronic kidney disease, stage 5: Secondary | ICD-10-CM | POA: Diagnosis not present

## 2015-10-30 DIAGNOSIS — N1330 Unspecified hydronephrosis: Secondary | ICD-10-CM | POA: Diagnosis present

## 2015-10-30 DIAGNOSIS — R0602 Shortness of breath: Secondary | ICD-10-CM

## 2015-10-30 DIAGNOSIS — Z961 Presence of intraocular lens: Secondary | ICD-10-CM | POA: Diagnosis present

## 2015-10-30 DIAGNOSIS — I1311 Hypertensive heart and chronic kidney disease without heart failure, with stage 5 chronic kidney disease, or end stage renal disease: Secondary | ICD-10-CM | POA: Diagnosis not present

## 2015-10-30 DIAGNOSIS — N390 Urinary tract infection, site not specified: Secondary | ICD-10-CM

## 2015-10-30 DIAGNOSIS — I252 Old myocardial infarction: Secondary | ICD-10-CM

## 2015-10-30 DIAGNOSIS — G9341 Metabolic encephalopathy: Secondary | ICD-10-CM | POA: Diagnosis present

## 2015-10-30 DIAGNOSIS — Z992 Dependence on renal dialysis: Secondary | ICD-10-CM

## 2015-10-30 DIAGNOSIS — A419 Sepsis, unspecified organism: Secondary | ICD-10-CM | POA: Diagnosis not present

## 2015-10-30 DIAGNOSIS — N179 Acute kidney failure, unspecified: Secondary | ICD-10-CM | POA: Diagnosis not present

## 2015-10-30 DIAGNOSIS — E875 Hyperkalemia: Secondary | ICD-10-CM | POA: Diagnosis not present

## 2015-10-30 DIAGNOSIS — Z87442 Personal history of urinary calculi: Secondary | ICD-10-CM

## 2015-10-30 DIAGNOSIS — B965 Pseudomonas (aeruginosa) (mallei) (pseudomallei) as the cause of diseases classified elsewhere: Secondary | ICD-10-CM | POA: Diagnosis present

## 2015-10-30 DIAGNOSIS — Z9842 Cataract extraction status, left eye: Secondary | ICD-10-CM

## 2015-10-30 DIAGNOSIS — I10 Essential (primary) hypertension: Secondary | ICD-10-CM | POA: Diagnosis not present

## 2015-10-30 DIAGNOSIS — I129 Hypertensive chronic kidney disease with stage 1 through stage 4 chronic kidney disease, or unspecified chronic kidney disease: Secondary | ICD-10-CM | POA: Diagnosis not present

## 2015-10-30 DIAGNOSIS — R079 Chest pain, unspecified: Secondary | ICD-10-CM | POA: Diagnosis not present

## 2015-10-30 DIAGNOSIS — Y846 Urinary catheterization as the cause of abnormal reaction of the patient, or of later complication, without mention of misadventure at the time of the procedure: Secondary | ICD-10-CM | POA: Diagnosis present

## 2015-10-30 DIAGNOSIS — I480 Paroxysmal atrial fibrillation: Secondary | ICD-10-CM | POA: Diagnosis not present

## 2015-10-30 DIAGNOSIS — N183 Chronic kidney disease, stage 3 (moderate): Secondary | ICD-10-CM | POA: Diagnosis not present

## 2015-10-30 DIAGNOSIS — R509 Fever, unspecified: Secondary | ICD-10-CM | POA: Diagnosis not present

## 2015-10-30 DIAGNOSIS — H409 Unspecified glaucoma: Secondary | ICD-10-CM | POA: Diagnosis present

## 2015-10-30 DIAGNOSIS — I2510 Atherosclerotic heart disease of native coronary artery without angina pectoris: Secondary | ICD-10-CM | POA: Diagnosis present

## 2015-10-30 DIAGNOSIS — Z8052 Family history of malignant neoplasm of bladder: Secondary | ICD-10-CM

## 2015-10-30 DIAGNOSIS — N139 Obstructive and reflux uropathy, unspecified: Secondary | ICD-10-CM

## 2015-10-30 DIAGNOSIS — Z8249 Family history of ischemic heart disease and other diseases of the circulatory system: Secondary | ICD-10-CM

## 2015-10-30 DIAGNOSIS — N189 Chronic kidney disease, unspecified: Secondary | ICD-10-CM | POA: Diagnosis not present

## 2015-10-30 DIAGNOSIS — Z9841 Cataract extraction status, right eye: Secondary | ICD-10-CM

## 2015-10-30 DIAGNOSIS — R0789 Other chest pain: Secondary | ICD-10-CM | POA: Diagnosis not present

## 2015-10-30 DIAGNOSIS — N186 End stage renal disease: Secondary | ICD-10-CM | POA: Diagnosis not present

## 2015-10-30 DIAGNOSIS — K219 Gastro-esophageal reflux disease without esophagitis: Secondary | ICD-10-CM | POA: Diagnosis present

## 2015-10-30 HISTORY — DX: End stage renal disease: N18.6

## 2015-10-30 MED ORDER — SODIUM CHLORIDE 0.9 % IV SOLN
1000.0000 mL | Freq: Once | INTRAVENOUS | Status: AC
  Administered 2015-10-31: 1000 mL via INTRAVENOUS

## 2015-10-30 MED ORDER — SODIUM CHLORIDE 0.9 % IV SOLN
1000.0000 mL | INTRAVENOUS | Status: DC
  Administered 2015-10-31: 1000 mL via INTRAVENOUS

## 2015-10-30 NOTE — ED Notes (Signed)
Pt comes to Ed from home, via ems c/o of fever and possible UTI.Nephrostomy tube present. Pt took a 1000 mg tylenol before EMS arrival. Pt has a hx of UTI.  V/s on arrival 130/64, pulse 86, rr 16, [id number].  Allergies to pain meds.  Other medical hx afib, CHF,  Gerd. Family otw.

## 2015-10-30 NOTE — ED Provider Notes (Signed)
CSN: [id number]     Arrival date & time 10/30/15  2303 History   First MD Initiated Contact with Patient 10/30/15 2319     Chief Complaint  Patient presents with  . Fever    101.3 pt took a 1000 mg tylenol before ems arrive   . Urinary Tract Infection    hx of UTI      (Consider location/radiation/quality/duration/timing/severity/associated sxs/prior Treatment) Patient is a 70 y.o. female presenting with fever and urinary tract infection. The history is provided by the patient.  Fever Urinary Tract Infection Associated symptoms: fever   She Had onset this afternoon of chills and fever to 101.3. She was also confused at home. She has a right nephrostomy tube and has had problems with urosepsis. Prior episodes of generally been around the time that the catheter was changed, but catheter has not been changed recently. She denies cough or nausea or vomiting. Confusion has improved. She was treated with acetaminophen at home.  Past Medical History  Diagnosis Date  . GERD (gastroesophageal reflux disease)   . Glaucoma   . H/O renal calculi 2002 & 2006  . H/O hiatal hernia   . Headache(784.0)     migraine  . Pneumonia     dx 10-06-2014 per CXR--  on 10-27-2014 pt states finished antibiotic and denies cough or fever  . Hyperlipidemia   . History of MI (myocardial infarction)     10/ 2013 in setting of Septic Shock  . History of atrial fibrillation     10/ 2013  in setting of Septic Shock  . History of CHF (congestive heart failure)     10/ 2013 in setting of septic shock  . Sigmoid diverticulosis   . Nephrolithiasis     bilateral  . CKD (chronic kidney disease), stage III     nephrologist-  dr Florene Glen  . Right ureteral stone   . History of acute respiratory failure     10/ 2013  -- ventilated in setting septic shock  . Dysrhythmia     Afib in 2013 when she had septic shock related to stone ureteral obstruction  . Myocardial infarction [facility])     Was reported in 2013 during  hospitalization with septic shock  . Septic shock ([place]) 04/04/2012  . Sepsis ([place])   . Hypertension     medication removed from regimen due to low blood pressure   . Peripheral vascular disease ([place])   . S/P hemodialysis catheter insertion ([place]) 04/11/2015     right anterior chest , only used once   . History of nephrostomy 04/11/2015     currently inplace 04/28/2015   . History of blood transfusion 04/13/2015   . Low iron     hx  . Glaucoma   . History of blood product transfusion   . Coronary artery disease   . Complication of anesthesia     use a little anesthesia , per patient MD states she quit breathing (2016); hard to wake up   Past Surgical History  Procedure Laterality Date  . Cystoscopy w/ ureteral stent placement  04/04/2012    Procedure: CYSTOSCOPY WITH RETROGRADE PYELOGRAM/URETERAL STENT PLACEMENT;  Surgeon: Ailene Rud, MD;  Location: [place];  Service: Urology;  Laterality: Left;  . Total abdominal hysterectomy w/ bilateral salpingoophorectomy  1993    secondary to fibroids  . Breast biopsy Left 08/23/07    benign fibrocystic with duct ectasia  . Cardiac catheterization  07-11-2012  dr Irish Lack    Abnormal  stress test/   normal coronary arteries/  LVEDP  7mmHg  . Cardiovascular stress test  06-26-2012  dr Irish Lack    marked ischemia in the basal anterior, mid anterior, apical inferior regions/  normal LVF, ef 63%  . Transthoracic echocardiogram  04-09-2012    normal LVF,  ef 60-65%,  mild LAE,  mild TR, trivial MR and PR  . Cataract extraction w/ intraocular lens  implant, bilateral    . Knee arthroscopy Left 02-14-2003  . Laparoscopic cholecystectomy  03-23-2005  . Extracorporeal shock wave lithotripsy  05-28-2012  &  10-08-2012  . Cystoscopy with stent placement Right 10/28/2014    Procedure: RIGHT URETERAL STENT PLACEMENT;  Surgeon: Irine Seal, MD;  Location: [facility];  Service: Urology;  Laterality: Right;  .  Cystoscopy/retrograde/ureteroscopy/stone extraction with basket Right 11/21/2014    Procedure: CYSTOSCOPY/RIGHT RETROGRADE PYELOGRAM/RIGHT URETEROSCOPY/BASKET EXTRACTION/RIGHT PYELOSCOPY/LASER OF STONE/RIGHT DOUBLE J STENT;  Surgeon: Carolan Clines, MD;  Location: [place];  Service: Urology;  Laterality: Right;  . Holmium laser application Right [id number]    Procedure: HOLMIUM LASER APPLICATION;  Surgeon: Carolan Clines, MD;  Location: [facility];  Service: Urology;  Laterality: Right;  . Cystoscopy with stent placement Right 02/26/2015    Procedure: CYSTOSCOPY RETROGRADE PYELOGRAM WITH STENT PLACEMENT;  Surgeon: Cleon Gustin, MD;  Location: [facility];  Service: Urology;  Laterality: Right;  . Cystoscopy w/ ureteral stent placement Bilateral 05/04/2015    Procedure: CYSTOSCOPY WITH BILATERAL RETROGRADE PYELOGRAM/ WITH INTERPRETATION, EXCHANGE OF RIGHT URETERAL STENT REPLACEMENT AND PLACEMENT LEFT URETERAL STENT PLACEMENT EXAMINATION OF VAGINA;  Surgeon: Carolan Clines, MD;  Location: [facility];  Service: Urology;  Laterality: Bilateral;  . Av fistula placement Left 06/02/2015    Procedure: BRACHIOCEPHALIC ARTERIOVENOUS (AV) FISTULA CREATION ;  Surgeon: Conrad Neosho, MD;  Location: [place];  Service: Vascular;  Laterality: Left;  . Abdominal hysterectomy    . Bascilic vein transposition Left 07/27/2015    Procedure: FIRST STAGE BASILIC VEIN TRANSPOSITION LEFT UPPER ARM;  Surgeon: Conrad [place], MD;  Location: [place];  Service: Vascular;  Laterality: Left;  . Bascilic vein transposition Left 09/2015    second phase  . Bascilic vein transposition Left 10/12/2015    Procedure: SECOND STAGE BASILIC VEIN TRANSPOSITION LEFT ARM;  Surgeon: Conrad Garden, MD;  [facility];  Service: Vascular;  Laterality: Left;   Family History  Problem Relation Age of Onset  . Hypertension Mother   . Cancer Mother 2    breast  . Dementia Mother   . Hypertension Brother   .  Diabetes Brother   . Heart disease Brother     before age 53  . Cancer Father 65    pancreatic  . Heart failure Paternal Grandmother   . Bladder Cancer Maternal Grandfather    Social History  Substance Use Topics  . Smoking status: Never Smoker   . Smokeless tobacco: Never Used  . Alcohol Use: No   OB History    Gravida Para Term Preterm AB TAB SAB Ectopic Multiple Living   1 1 1  0 0 0 0 0 0 1     Review of Systems  Constitutional: Positive for fever.  All other systems reviewed and are negative.     Allergies  Vicodin; Chlorhexidine; and Percocet  Home Medications   Prior to Admission medications   Medication Sig Start Date End Date Taking? Authorizing Provider  acetaminophen (TYLENOL) 500 MG tablet Take 1,000 mg by mouth  every 6 (six) hours as needed for moderate pain or headache.    Yes Historical Provider, MD  bimatoprost (LUMIGAN) 0.01 % SOLN Place 1 drop into both eyes at bedtime.   Yes Historical Provider, MD  metoprolol tartrate (LOPRESSOR) 25 MG tablet TAKE 1/2 TABLET BY MOUTH TWICE DAILY Patient taking differently: Take one-half tablet (12.5 mg) by mouth twice daily. 09/02/15  Yes Jettie Booze, MD  Nutritional Supplements (FEEDING SUPPLEMENT, NEPRO CARB STEADY,) LIQD Take 237 mLs by mouth daily. Patient taking differently: Take 237 mLs by mouth daily as needed (nutritional supplement).  05/07/15  Yes Maryann Mikhail, DO  Nystatin ([place]) 100000 UNIT/GM POWD Apply 1 application topically 2 (two) times daily.  09/16/15  Yes Historical Provider, MD  nystatin cream (MYCOSTATIN) Apply 1 application topically 2 (two) times daily. Apply to affected area BID for up to 7 days. 10/28/15  Yes Kem Boroughs, FNP  ranitidine (ZANTAC) 150 MG tablet Take 150 mg by mouth 2 (two) times daily as needed for heartburn.   Yes Historical Provider, MD  saccharomyces boulardii (FLORASTOR) 250 MG capsule Take 1 capsule (250 mg total) by mouth 2 (two) times daily. 07/08/15  Yes Donne Hazel, MD  sodium bicarbonate 650 MG tablet Take 1 tablet (650 mg total) by mouth 2 (two) times daily. Patient taking differently: Take 650 mg by mouth 3 (three) times daily.  05/30/15  Yes Robbie Lis, MD  sodium chloride (OCEAN) 0.65 % SOLN nasal spray Place 1 spray into both nostrils 3 (three) times daily as needed for congestion.   Yes Historical Provider, MD  timolol (BETIMOL) 0.5 % ophthalmic solution Place 1 drop into both eyes every morning.    Yes Historical Provider, MD  traMADol (ULTRAM) 50 MG tablet Take 1 tablet (50 mg total) by mouth every 6 (six) hours as needed. Patient taking differently: Take 50 mg by mouth every 6 (six) hours as needed for moderate pain or severe pain.  10/12/15  Yes Samantha J Rhyne, PA-C  triamcinolone (KENALOG) 0.025 % ointment Apply 1 application topically 2 (two) times daily. 10/28/15  Yes Kem Boroughs, FNP  fluconazole (DIFLUCAN) 150 MG tablet Take one tablet and repeat in 48 hours.  Then again 3 days later Patient not taking: Reported on 10/30/2015 10/28/15   Kem Boroughs, FNP   BP 124/65 mmHg  Pulse 74  Temp(Src) 98.2 F (36.8 C) (Oral)  Resp 20  SpO2 98%  LMP 01/19/1992 (Approximate) Physical Exam  Nursing note and vitals reviewed.  70 year old female, resting comfortably and in no acute distress. Vital signs are normal. Oxygen saturation is 98%, which is normal. Head is normocephalic and atraumatic. PERRLA, EOMI. Oropharynx is clear. Neck is nontender and supple without adenopathy or JVD. Back is nontender and there is no CVA tenderness. Lungs are clear without rales, wheezes, or rhonchi. Chest is nontender. Heart has regular rate and rhythm without murmur. Abdomen is soft, flat, nontender without masses or hepatosplenomegaly and peristalsis is normoactive. Right nephrostomy tube in place draining clear urine. Extremities have no cyanosis or edema, full range of motion is present. Skin is warm and dry without rash. Neurologic: Mental  status is normal, cranial nerves are intact, there are no motor or sensory deficits.  ED Course  Procedures (including critical care time) Labs Review Results for orders placed or performed during the hospital encounter of [id number]  Basic metabolic panel  Result Value Ref Range   Sodium 137 135 - 145 mmol/L   Potassium 5.2 (  H) 3.5 - 5.1 mmol/L   Chloride 104 101 - 111 mmol/L   CO2 19 (L) 22 - 32 mmol/L   Glucose, Bld 100 (H) 65 - 99 mg/dL   BUN 82 (H) 6 - 20 mg/dL   Creatinine, Ser 8.06 (H) 0.44 - 1.00 mg/dL   Calcium 9.4 8.9 - 10.3 mg/dL   GFR calc non Af Amer 4 (L) >60 mL/min   GFR calc Af Amer 5 (L) >60 mL/min   Anion gap 14 5 - 15  CBC with Differential  Result Value Ref Range   WBC 20.7 (H) 4.0 - 10.5 K/uL   RBC 3.60 (L) 3.87 - 5.11 MIL/uL   Hemoglobin 9.7 (L) 12.0 - 15.0 g/dL   HCT 30.2 (L) 36.0 - 46.0 %   MCV 83.9 78.0 - 100.0 fL   MCH 26.9 26.0 - 34.0 pg   MCHC 32.1 30.0 - 36.0 g/dL   RDW 16.8 (H) 11.5 - 15.5 %   Platelets 385 150 - 400 K/uL   Neutrophils Relative % 89 %   Neutro Abs 18.3 (H) 1.7 - 7.7 K/uL   Lymphocytes Relative 6 %   Lymphs Abs 1.3 0.7 - 4.0 K/uL   Monocytes Relative 4 %   Monocytes Absolute 0.9 0.1 - 1.0 K/uL   Eosinophils Relative 1 %   Eosinophils Absolute 0.1 0.0 - 0.7 K/uL   Basophils Relative 0 %   Basophils Absolute 0.0 0.0 - 0.1 K/uL  Urinalysis, Routine w reflex microscopic  Result Value Ref Range   Color, Urine YELLOW YELLOW   APPearance TURBID (A) CLEAR   Specific Gravity, Urine 1.011 1.005 - 1.030   pH 6.0 5.0 - 8.0   Glucose, UA NEGATIVE NEGATIVE mg/dL   Hgb urine dipstick LARGE (A) NEGATIVE   Bilirubin Urine NEGATIVE NEGATIVE   Ketones, ur NEGATIVE NEGATIVE mg/dL   Protein, ur 100 (A) NEGATIVE mg/dL   Nitrite NEGATIVE NEGATIVE   Leukocytes, UA LARGE (A) NEGATIVE  Urine microscopic-add on  Result Value Ref Range   Squamous Epithelial / LPF NONE SEEN NONE SEEN   WBC, UA TOO NUMEROUS TO COUNT 0 - 5 WBC/hpf   RBC / HPF 6-30  0 - 5 RBC/hpf   Bacteria, UA RARE (A) NONE SEEN   Casts HYALINE CASTS (A) NEGATIVE  I-Stat CG4 Lactic Acid, ED  Result Value Ref Range   Lactic Acid, Venous 0.97 0.5 - 2.0 mmol/L   I have personally reviewed and evaluated these images and lab results as part of my medical decision-making.   MDM   Final diagnoses:  Urinary tract infection without hematuria, site unspecified  Acute on chronic renal failure (HCC)  Anemia of renal disease    Fever at home of 4 inpatient with known history of recurrent urosepsis. Vital signs are normal now and she does not appear to be in distress. Old records are reviewed confirming hospitalization one month ago for urosepsis related to changing nephrostomy tube. Screening labs are obtained as well as urine culture.  Lactic acid is come back normal but WBC is markedly elevated at 20,000. Renal function is stable compared with 2 days ago, but significantly worse than upon discharge from the hospital last month. Patient is not clinically uremic. Last time in the hospital, she was treated with cefepime with good clinical response. Given that this is healthcare associated urinary tract infection, she started on vancomycin plus cefepime. Case is discussed with Dr. Tamala Julian of [facility] who agrees to admit the patient.  Delora Fuel, MD [phone]

## 2015-10-30 NOTE — ED Notes (Signed)
Bed: [facility] Expected date:  Expected time:  Means of arrival:  Comments: EMS 69yo UTI / Fever

## 2015-10-31 ENCOUNTER — Inpatient Hospital Stay (HOSPITAL_COMMUNITY): Payer: Medicare Other

## 2015-10-31 ENCOUNTER — Encounter (HOSPITAL_COMMUNITY): Payer: Self-pay | Admitting: *Deleted

## 2015-10-31 DIAGNOSIS — I4891 Unspecified atrial fibrillation: Secondary | ICD-10-CM | POA: Diagnosis not present

## 2015-10-31 DIAGNOSIS — Z87442 Personal history of urinary calculi: Secondary | ICD-10-CM | POA: Diagnosis not present

## 2015-10-31 DIAGNOSIS — N390 Urinary tract infection, site not specified: Secondary | ICD-10-CM | POA: Diagnosis present

## 2015-10-31 DIAGNOSIS — K219 Gastro-esophageal reflux disease without esophagitis: Secondary | ICD-10-CM | POA: Diagnosis present

## 2015-10-31 DIAGNOSIS — Z9841 Cataract extraction status, right eye: Secondary | ICD-10-CM | POA: Diagnosis not present

## 2015-10-31 DIAGNOSIS — Z8249 Family history of ischemic heart disease and other diseases of the circulatory system: Secondary | ICD-10-CM | POA: Diagnosis not present

## 2015-10-31 DIAGNOSIS — Z992 Dependence on renal dialysis: Secondary | ICD-10-CM | POA: Diagnosis not present

## 2015-10-31 DIAGNOSIS — I120 Hypertensive chronic kidney disease with stage 5 chronic kidney disease or end stage renal disease: Secondary | ICD-10-CM | POA: Diagnosis not present

## 2015-10-31 DIAGNOSIS — E875 Hyperkalemia: Secondary | ICD-10-CM | POA: Diagnosis present

## 2015-10-31 DIAGNOSIS — N185 Chronic kidney disease, stage 5: Secondary | ICD-10-CM

## 2015-10-31 DIAGNOSIS — D631 Anemia in chronic kidney disease: Secondary | ICD-10-CM

## 2015-10-31 DIAGNOSIS — N19 Unspecified kidney failure: Secondary | ICD-10-CM | POA: Diagnosis not present

## 2015-10-31 DIAGNOSIS — R0789 Other chest pain: Secondary | ICD-10-CM | POA: Diagnosis not present

## 2015-10-31 DIAGNOSIS — Z9842 Cataract extraction status, left eye: Secondary | ICD-10-CM | POA: Diagnosis not present

## 2015-10-31 DIAGNOSIS — H409 Unspecified glaucoma: Secondary | ICD-10-CM

## 2015-10-31 DIAGNOSIS — B965 Pseudomonas (aeruginosa) (mallei) (pseudomallei) as the cause of diseases classified elsewhere: Secondary | ICD-10-CM | POA: Diagnosis present

## 2015-10-31 DIAGNOSIS — Z8052 Family history of malignant neoplasm of bladder: Secondary | ICD-10-CM | POA: Diagnosis not present

## 2015-10-31 DIAGNOSIS — R509 Fever, unspecified: Secondary | ICD-10-CM | POA: Diagnosis not present

## 2015-10-31 DIAGNOSIS — I252 Old myocardial infarction: Secondary | ICD-10-CM | POA: Diagnosis not present

## 2015-10-31 DIAGNOSIS — A419 Sepsis, unspecified organism: Secondary | ICD-10-CM | POA: Diagnosis not present

## 2015-10-31 DIAGNOSIS — G9341 Metabolic encephalopathy: Secondary | ICD-10-CM | POA: Diagnosis not present

## 2015-10-31 DIAGNOSIS — N189 Chronic kidney disease, unspecified: Secondary | ICD-10-CM | POA: Diagnosis not present

## 2015-10-31 DIAGNOSIS — I1311 Hypertensive heart and chronic kidney disease without heart failure, with stage 5 chronic kidney disease, or end stage renal disease: Secondary | ICD-10-CM | POA: Diagnosis present

## 2015-10-31 DIAGNOSIS — I10 Essential (primary) hypertension: Secondary | ICD-10-CM | POA: Diagnosis not present

## 2015-10-31 DIAGNOSIS — A4152 Sepsis due to Pseudomonas: Secondary | ICD-10-CM | POA: Diagnosis not present

## 2015-10-31 DIAGNOSIS — Z87440 Personal history of urinary (tract) infections: Secondary | ICD-10-CM | POA: Diagnosis not present

## 2015-10-31 DIAGNOSIS — E872 Acidosis: Secondary | ICD-10-CM | POA: Diagnosis not present

## 2015-10-31 DIAGNOSIS — R0602 Shortness of breath: Secondary | ICD-10-CM | POA: Diagnosis present

## 2015-10-31 DIAGNOSIS — I2510 Atherosclerotic heart disease of native coronary artery without angina pectoris: Secondary | ICD-10-CM | POA: Diagnosis present

## 2015-10-31 DIAGNOSIS — Y846 Urinary catheterization as the cause of abnormal reaction of the patient, or of later complication, without mention of misadventure at the time of the procedure: Secondary | ICD-10-CM | POA: Diagnosis present

## 2015-10-31 DIAGNOSIS — Z961 Presence of intraocular lens: Secondary | ICD-10-CM | POA: Diagnosis present

## 2015-10-31 DIAGNOSIS — I480 Paroxysmal atrial fibrillation: Secondary | ICD-10-CM | POA: Diagnosis not present

## 2015-10-31 DIAGNOSIS — N186 End stage renal disease: Secondary | ICD-10-CM | POA: Diagnosis not present

## 2015-10-31 DIAGNOSIS — R079 Chest pain, unspecified: Secondary | ICD-10-CM | POA: Diagnosis not present

## 2015-10-31 DIAGNOSIS — N1330 Unspecified hydronephrosis: Secondary | ICD-10-CM | POA: Diagnosis not present

## 2015-10-31 DIAGNOSIS — N179 Acute kidney failure, unspecified: Secondary | ICD-10-CM | POA: Diagnosis not present

## 2015-10-31 DIAGNOSIS — T83512A Infection and inflammatory reaction due to nephrostomy catheter, initial encounter: Secondary | ICD-10-CM | POA: Diagnosis not present

## 2015-10-31 LAB — CBC WITH DIFFERENTIAL/PLATELET
Basophils Absolute: 0 10*3/uL (ref 0.0–0.1)
Basophils Relative: 0 %
Eosinophils Absolute: 0.1 10*3/uL (ref 0.0–0.7)
Eosinophils Relative: 1 %
HCT: 30.2 % — ABNORMAL LOW (ref 36.0–46.0)
Hemoglobin: 9.7 g/dL — ABNORMAL LOW (ref 12.0–15.0)
Lymphocytes Relative: 6 %
Lymphs Abs: 1.3 10*3/uL (ref 0.7–4.0)
MCH: 26.9 pg (ref 26.0–34.0)
MCHC: 32.1 g/dL (ref 30.0–36.0)
MCV: 83.9 fL (ref 78.0–100.0)
Monocytes Absolute: 0.9 10*3/uL (ref 0.1–1.0)
Monocytes Relative: 4 %
Neutro Abs: 18.3 10*3/uL — ABNORMAL HIGH (ref 1.7–7.7)
Neutrophils Relative %: 89 %
Platelets: 385 10*3/uL (ref 150–400)
RBC: 3.6 MIL/uL — ABNORMAL LOW (ref 3.87–5.11)
RDW: 16.8 % — ABNORMAL HIGH (ref 11.5–15.5)
WBC: 20.7 10*3/uL — ABNORMAL HIGH (ref 4.0–10.5)

## 2015-10-31 LAB — BASIC METABOLIC PANEL
Anion gap: 13 (ref 5–15)
Anion gap: 14 (ref 5–15)
BUN: 80 mg/dL — ABNORMAL HIGH (ref 6–20)
BUN: 82 mg/dL — ABNORMAL HIGH (ref 6–20)
CO2: 17 mmol/L — ABNORMAL LOW (ref 22–32)
CO2: 19 mmol/L — ABNORMAL LOW (ref 22–32)
Calcium: 8.6 mg/dL — ABNORMAL LOW (ref 8.9–10.3)
Calcium: 9.4 mg/dL (ref 8.9–10.3)
Chloride: 104 mmol/L (ref 101–111)
Chloride: 108 mmol/L (ref 101–111)
Creatinine, Ser: 7.39 mg/dL — ABNORMAL HIGH (ref 0.44–1.00)
Creatinine, Ser: 8.06 mg/dL — ABNORMAL HIGH (ref 0.44–1.00)
GFR calc Af Amer: 5 mL/min — ABNORMAL LOW (ref >60)
GFR calc Af Amer: 6 mL/min — ABNORMAL LOW (ref >60)
GFR calc non Af Amer: 4 mL/min — ABNORMAL LOW (ref >60)
GFR calc non Af Amer: 5 mL/min — ABNORMAL LOW (ref >60)
Glucose, Bld: 100 mg/dL — ABNORMAL HIGH (ref 65–99)
Glucose, Bld: 96 mg/dL (ref 65–99)
Potassium: 5.1 mmol/L (ref 3.5–5.1)
Potassium: 5.2 mmol/L — ABNORMAL HIGH (ref 3.5–5.1)
Sodium: 137 mmol/L (ref 135–145)
Sodium: 138 mmol/L (ref 135–145)

## 2015-10-31 LAB — URINE MICROSCOPIC-ADD ON: Squamous Epithelial / LPF: NONE SEEN

## 2015-10-31 LAB — URINALYSIS, ROUTINE W REFLEX MICROSCOPIC
Bilirubin Urine: NEGATIVE
Glucose, UA: NEGATIVE mg/dL
Ketones, ur: NEGATIVE mg/dL
Nitrite: NEGATIVE
Protein, ur: 100 mg/dL — AB
Specific Gravity, Urine: 1.011 (ref 1.005–1.030)
pH: 6 (ref 5.0–8.0)

## 2015-10-31 LAB — MRSA PCR SCREENING: MRSA by PCR: NEGATIVE

## 2015-10-31 LAB — I-STAT CG4 LACTIC ACID, ED: Lactic Acid, Venous: 0.97 mmol/L (ref 0.5–2.0)

## 2015-10-31 MED ORDER — ACETAMINOPHEN 325 MG PO TABS
650.0000 mg | ORAL_TABLET | Freq: Four times a day (QID) | ORAL | Status: DC | PRN
Start: 2015-10-31 — End: 2015-11-11
  Administered 2015-10-31 – 2015-11-10 (×15): 650 mg via ORAL
  Filled 2015-10-31 (×13): qty 2

## 2015-10-31 MED ORDER — SALINE SPRAY 0.65 % NA SOLN
1.0000 | Freq: Three times a day (TID) | NASAL | Status: DC | PRN
  Filled 2015-10-31: qty 44

## 2015-10-31 MED ORDER — METOPROLOL TARTRATE 12.5 MG HALF TABLET
12.5000 mg | ORAL_TABLET | Freq: Two times a day (BID) | ORAL | Status: DC
  Administered 2015-10-31 – 2015-11-05 (×11): 12.5 mg via ORAL
  Filled 2015-10-31 (×11): qty 1

## 2015-10-31 MED ORDER — ONDANSETRON HCL 4 MG/2ML IJ SOLN
4.0000 mg | Freq: Four times a day (QID) | INTRAMUSCULAR | Status: DC | PRN
  Administered 2015-10-31 – 2015-11-03 (×4): 4 mg via INTRAVENOUS
  Filled 2015-10-31 (×5): qty 2

## 2015-10-31 MED ORDER — ACETAMINOPHEN 650 MG RE SUPP: 650.0000 mg | Freq: Four times a day (QID) | RECTAL | Status: DC | PRN

## 2015-10-31 MED ORDER — TRAMADOL HCL 50 MG PO TABS
50.0000 mg | ORAL_TABLET | Freq: Four times a day (QID) | ORAL | Status: DC | PRN
  Filled 2015-10-31: qty 1

## 2015-10-31 MED ORDER — ONDANSETRON HCL 4 MG PO TABS
4.0000 mg | ORAL_TABLET | Freq: Four times a day (QID) | ORAL | Status: DC | PRN
Start: 2015-10-31 — End: 2015-11-11

## 2015-10-31 MED ORDER — ONDANSETRON HCL 4 MG/2ML IJ SOLN
4.0000 mg | INTRAMUSCULAR | Status: AC | PRN
  Administered 2015-10-31 (×2): 4 mg via INTRAVENOUS
  Filled 2015-10-31: qty 2

## 2015-10-31 MED ORDER — TIMOLOL MALEATE 0.5 % OP SOLN
1.0000 [drp] | Freq: Every morning | OPHTHALMIC | Status: DC
  Administered 2015-10-31 – 2015-11-11 (×12): 1 [drp] via OPHTHALMIC
  Filled 2015-10-31: qty 5

## 2015-10-31 MED ORDER — VANCOMYCIN HCL IN DEXTROSE 1-5 GM/200ML-% IV SOLN
1000.0000 mg | Freq: Once | INTRAVENOUS | Status: AC
  Administered 2015-10-31: 1000 mg via INTRAVENOUS
  Filled 2015-10-31: qty 200

## 2015-10-31 MED ORDER — NYSTATIN 100000 UNIT/GM EX POWD
Freq: Two times a day (BID) | CUTANEOUS | Status: DC
  Filled 2015-10-31: qty 15

## 2015-10-31 MED ORDER — SACCHAROMYCES BOULARDII 250 MG PO CAPS
250.0000 mg | ORAL_CAPSULE | Freq: Two times a day (BID) | ORAL | Status: DC
  Administered 2015-10-31 – 2015-11-11 (×23): 250 mg via ORAL
  Filled 2015-10-31 (×24): qty 1

## 2015-10-31 MED ORDER — DEXTROSE 5 % IV SOLN
1.0000 g | INTRAVENOUS | Status: DC
  Administered 2015-11-01 – 2015-11-04 (×4): 1 g via INTRAVENOUS
  Filled 2015-10-31 (×7): qty 1

## 2015-10-31 MED ORDER — LATANOPROST 0.005 % OP SOLN
1.0000 [drp] | Freq: Every day | OPHTHALMIC | Status: DC
  Administered 2015-11-01 – 2015-11-10 (×10): 1 [drp] via OPHTHALMIC
  Filled 2015-10-31 (×2): qty 2.5

## 2015-10-31 MED ORDER — NYSTATIN 100000 UNIT/GM EX CREA
1.0000 "application " | TOPICAL_CREAM | Freq: Two times a day (BID) | CUTANEOUS | Status: DC | PRN
  Administered 2015-10-31 – 2015-11-10 (×6): 1 via TOPICAL
  Filled 2015-10-31 (×2): qty 15

## 2015-10-31 MED ORDER — HEPARIN SODIUM (PORCINE) 5000 UNIT/ML IJ SOLN
5000.0000 [IU] | Freq: Three times a day (TID) | INTRAMUSCULAR | Status: DC
  Administered 2015-10-31 – 2015-11-06 (×17): 5000 [IU] via SUBCUTANEOUS
  Filled 2015-10-31 (×14): qty 1

## 2015-10-31 MED ORDER — ALBUTEROL SULFATE (2.5 MG/3ML) 0.083% IN NEBU: 2.5000 mg | INHALATION_SOLUTION | RESPIRATORY_TRACT | Status: DC | PRN

## 2015-10-31 MED ORDER — TRAMADOL HCL 50 MG PO TABS
50.0000 mg | ORAL_TABLET | Freq: Two times a day (BID) | ORAL | Status: DC | PRN
  Administered 2015-10-31 – 2015-11-03 (×2): 50 mg via ORAL
  Filled 2015-10-31: qty 1

## 2015-10-31 MED ORDER — FAMOTIDINE 20 MG PO TABS
20.0000 mg | ORAL_TABLET | Freq: Two times a day (BID) | ORAL | Status: DC
  Administered 2015-10-31 – 2015-11-01 (×4): 20 mg via ORAL
  Filled 2015-10-31 (×4): qty 1

## 2015-10-31 MED ORDER — TRIAMCINOLONE ACETONIDE 0.025 % EX CREA
1.0000 "application " | TOPICAL_CREAM | Freq: Two times a day (BID) | CUTANEOUS | Status: DC
  Administered 2015-10-31 – 2015-11-11 (×22): 1 via TOPICAL
  Filled 2015-10-31 (×2): qty 15

## 2015-10-31 MED ORDER — ACETAMINOPHEN 325 MG PO TABS
650.0000 mg | ORAL_TABLET | Freq: Once | ORAL | Status: AC
  Administered 2015-10-31: 650 mg via ORAL
  Filled 2015-10-31: qty 2

## 2015-10-31 MED ORDER — DEXTROSE 5 % IV SOLN
1.0000 g | Freq: Once | INTRAVENOUS | Status: AC
  Administered 2015-10-31: 1 g via INTRAVENOUS
  Filled 2015-10-31 (×2): qty 1

## 2015-10-31 MED ORDER — SODIUM BICARBONATE 650 MG PO TABS
650.0000 mg | ORAL_TABLET | Freq: Three times a day (TID) | ORAL | Status: DC
Start: 2015-10-31 — End: 2015-11-01
  Administered 2015-10-31 – 2015-11-01 (×4): 650 mg via ORAL
  Filled 2015-10-31 (×4): qty 1

## 2015-10-31 MED ORDER — SODIUM CHLORIDE 0.9 % IV SOLN
INTRAVENOUS | Status: DC
  Administered 2015-10-31: 1000 mL via INTRAVENOUS

## 2015-10-31 MED ORDER — NEPRO/CARBSTEADY PO LIQD
237.0000 mL | Freq: Every day | ORAL | Status: DC | PRN
  Filled 2015-10-31: qty 237

## 2015-10-31 NOTE — Progress Notes (Signed)
Nursing Note: Pt arrived via stretcher and was able to stand and get in bed.Pt immed. Needed to go to bathroom.Felt she may have a BM but did not.Awake,alert and oriented x4.Denies pain.Pt has 2 IV sites and has R nephrostomy tube in place w/ dry dsg and drains light yellow,slightly cloudy urine.Pt has R chest HD cathter -double lumen.Pt has dry ,brtiitle toe nails[fungal-like],pt says not new.Pt has L upper arm fistula that is new +bruit and + thrill.incision,dark,dry.Pt has been treated for fungal infection prior to admission and has redness to L side of abd and to both buttocks.Pt says the doctor ordered creme that she has been using at home.MAE well.Pt oriented to room and call bell and educated to call staff for assistance when getting up.Pt resting quietly inbed.wbb

## 2015-10-31 NOTE — Progress Notes (Signed)
Report called to RN at [facility] that will receive patient.  Patient stable from AM assessment. [facility] called to transport patient.

## 2015-10-31 NOTE — Progress Notes (Signed)
Patient to be transported to [facility].  Patient picked up by [facility].  Report given to [facility].  Patient stable for transfer.  Zandra Abts [facility]  10/31/2015  1:48 PM

## 2015-10-31 NOTE — Consult Note (Signed)
Renal Consultation Note Requesting Physician:  [name] Reason for Consult:  Known CKD5, worsening renal failure  HPI: The patient is a 70 y.o. year-old WF with known CKD5, HTN, R PCN tube (changed every 6 weeks in IR) R IJ TDC (placed 04/11/15, HD once, and gets weekly flush/heparin exchange in [facility]) who has had several admissions for fevers/UTI usually around the time of stent change (most recently prior to the current admission 09/2015).  She recently (10/12/15) had a left 2nd stage BVT AVF done (1st stage was done 06/02/15) by Dr. Bridgett Larsson.  She was brought to [facility] ED last PM with fever (at home to 100.9, and confusion.  She is due for tube change this week. Of note is that she had the [facility] flushed on Thursday, 1 day prior to onset of fever and confusion.   At the time of admission to [facility], creatinine was 8.06 (6.43 early April) and at the time of Dr. Abel Presto last office appt he made the appt that she could "start dialysis at any time" but they have been trying to wait out maturation of her AVF. She has had cultures done, ATB's started. Creatinine today after some IVF was down to 7.39. As of this evening, her confusion has cleared.  She denies edema, anorexia, nausea, vomiting or other uremic symptoms. She has been afebrile since admission. She has been transferred to [facility] in the event that HD is needed.  CREATININE, SER  Date/Time Value Ref Range Status  10/31/2015 12:39 PM 7.39* 0.44 - 1.00 mg/dL Final  10/31/2015 12:09 AM 8.06* 0.44 - 1.00 mg/dL Final  10/29/2015 10:00 AM 8.35* 0.44 - 1.00 mg/dL Final  10/01/2015 10:30 AM 6.43* 0.44 - 1.00 mg/dL Final  09/28/2015 04:39 AM 6.40* 0.44 - 1.00 mg/dL Final  09/27/2015 04:07 AM 6.66* 0.44 - 1.00 mg/dL Final  09/26/2015 05:03 AM 6.87* 0.44 - 1.00 mg/dL Final  09/25/2015 10:37 AM 7.07* 0.44 - 1.00 mg/dL Final  09/17/2015 02:26 PM 6.21* 0.44 - 1.00 mg/dL Final  09/03/2015 10:42 AM 6.25* 0.44 - 1.00 mg/dL Final  08/27/2015 01:18 PM 6.49* 0.44 - 1.00 mg/dL Final   08/20/2015 11:16 AM 4.99* 0.44 - 1.00 mg/dL Final  08/18/2015 08:31 AM 4.83* 0.44 - 1.00 mg/dL Final  08/17/2015 04:53 AM 5.21* 0.44 - 1.00 mg/dL Final  08/16/2015 05:13 AM 5.02* 0.44 - 1.00 mg/dL Final  08/15/2015 05:03 AM 5.21* 0.44 - 1.00 mg/dL Final  08/14/2015 06:08 AM 5.25* 0.44 - 1.00 mg/dL Final  08/13/2015 11:00 PM 5.36* 0.44 - 1.00 mg/dL Final  08/06/2015 10:35 AM 5.20* 0.44 - 1.00 mg/dL Final  07/16/2015 12:51 PM 3.96* 0.44 - 1.00 mg/dL Final  07/08/2015 07:00 AM 4.32* 0.44 - 1.00 mg/dL Final  07/07/2015 05:05 AM 4.45* 0.44 - 1.00 mg/dL Final  07/06/2015 04:35 AM 4.61* 0.44 - 1.00 mg/dL Final  07/05/2015 04:18 AM 4.69* 0.44 - 1.00 mg/dL Final  07/04/2015 10:14 AM 4.71* 0.44 - 1.00 mg/dL Final    Past Medical History  Diagnosis Date  . GERD (gastroesophageal reflux disease)   . Glaucoma   . H/O renal calculi 2002 & 2006  . H/O hiatal hernia   . Headache(784.0)     migraine  . Pneumonia     dx 10-06-2014 per CXR--  on 10-27-2014 pt states finished antibiotic and denies cough or fever  . Hyperlipidemia   . History of MI (myocardial infarction)     10/ 2013 in setting of Septic Shock  . History of atrial fibrillation  10/ 2013  in setting of Septic Shock  . History of CHF (congestive heart failure)     10/ 2013 in setting of septic shock  . Sigmoid diverticulosis   . Nephrolithiasis     bilateral  . CKD (chronic kidney disease), stage III     nephrologist-  dr Florene Glen  . Right ureteral stone   . History of acute respiratory failure     10/ 2013  -- ventilated in setting septic shock  . Dysrhythmia     Afib in 2013 when she had septic shock related to stone ureteral obstruction  . Myocardial infarction [facility])     Was reported in 2013 during hospitalization with septic shock  . Septic shock ([place]) 04/04/2012  . Sepsis ([place])   . Hypertension     medication removed from regimen due to low blood pressure   . Peripheral vascular disease ([place])   . S/P hemodialysis  catheter insertion ([place]) 04/11/2015     right anterior chest , only used once   . History of nephrostomy 04/11/2015     currently inplace 04/28/2015   . History of blood transfusion 04/13/2015   . Low iron     hx  . Glaucoma   . History of blood product transfusion   . Coronary artery disease   . Complication of anesthesia     use a little anesthesia , per patient MD states she quit breathing (2016); hard to wake up    Past Surgical History:  Past Surgical History  Procedure Laterality Date  . Cystoscopy w/ ureteral stent placement  04/04/2012    Procedure: CYSTOSCOPY WITH RETROGRADE PYELOGRAM/URETERAL STENT PLACEMENT;  Surgeon: Ailene Rud, MD;  Location: [place];  Service: Urology;  Laterality: Left;  . Total abdominal hysterectomy w/ bilateral salpingoophorectomy  1993    secondary to fibroids  . Breast biopsy Left 08/23/07    benign fibrocystic with duct ectasia  . Cardiac catheterization  07-11-2012  dr Irish Lack    Abnormal stress test/   normal coronary arteries/  LVEDP  69mmHg  . Cardiovascular stress test  06-26-2012  dr Irish Lack    marked ischemia in the basal anterior, mid anterior, apical inferior regions/  normal LVF, ef 63%  . Transthoracic echocardiogram  04-09-2012    normal LVF,  ef 60-65%,  mild LAE,  mild TR, trivial MR and PR  . Cataract extraction w/ intraocular lens  implant, bilateral    . Knee arthroscopy Left 02-14-2003  . Laparoscopic cholecystectomy  03-23-2005  . Extracorporeal shock wave lithotripsy  05-28-2012  &  10-08-2012  . Cystoscopy with stent placement Right 10/28/2014    Procedure: RIGHT URETERAL STENT PLACEMENT;  Surgeon: Irine Seal, MD;  Location: [facility];  Service: Urology;  Laterality: Right;  . Cystoscopy/retrograde/ureteroscopy/stone extraction with basket Right 11/21/2014    Procedure: CYSTOSCOPY/RIGHT RETROGRADE PYELOGRAM/RIGHT URETEROSCOPY/BASKET EXTRACTION/RIGHT PYELOSCOPY/LASER OF STONE/RIGHT DOUBLE J STENT;   Surgeon: Carolan Clines, MD;  Location: [place];  Service: Urology;  Laterality: Right;  . Holmium laser application Right [id number]    Procedure: HOLMIUM LASER APPLICATION;  Surgeon: Carolan Clines, MD;  Location: [facility];  Service: Urology;  Laterality: Right;  . Cystoscopy with stent placement Right 02/26/2015    Procedure: CYSTOSCOPY RETROGRADE PYELOGRAM WITH STENT PLACEMENT;  Surgeon: Cleon Gustin, MD;  Location: [facility];  Service: Urology;  Laterality: Right;  . Cystoscopy w/ ureteral stent placement Bilateral 05/04/2015    Procedure: CYSTOSCOPY WITH BILATERAL RETROGRADE PYELOGRAM/  WITH INTERPRETATION, EXCHANGE OF RIGHT URETERAL STENT REPLACEMENT AND PLACEMENT LEFT URETERAL STENT PLACEMENT EXAMINATION OF VAGINA;  Surgeon: Carolan Clines, MD;  Location: [facility];  Service: Urology;  Laterality: Bilateral;  . Av fistula placement Left 06/02/2015    Procedure: BRACHIOCEPHALIC ARTERIOVENOUS (AV) FISTULA CREATION ;  Surgeon: Conrad Hallock, MD;  Location: [place];  Service: Vascular;  Laterality: Left;  . Abdominal hysterectomy    . Bascilic vein transposition Left 07/27/2015    Procedure: FIRST STAGE BASILIC VEIN TRANSPOSITION LEFT UPPER ARM;  Surgeon: Conrad [place], MD;  Location: [place];  Service: Vascular;  Laterality: Left;  . Bascilic vein transposition Left 09/2015    second phase  . Bascilic vein transposition Left 10/12/2015    Procedure: SECOND STAGE BASILIC VEIN TRANSPOSITION LEFT ARM;  Surgeon: Conrad Spackenkill, MD;  Location: [facility];  Service: Vascular;  Laterality: Left;    Family History  Problem Relation Age of Onset  . Hypertension Mother   . Cancer Mother 61    breast  . Dementia Mother   . Hypertension Brother   . Diabetes Brother   . Heart disease Brother     before age 41  . Cancer Father 37    pancreatic  . Heart failure Paternal Grandmother   . Bladder Cancer Maternal Grandfather    Social History:  reports that she has never  smoked. She has never used smokeless tobacco. She reports that she does not drink alcohol or use illicit drugs.   Allergies  Allergen Reactions  . Vicodin [Hydrocodone-Acetaminophen] Nausea And Vomiting  . Chlorhexidine Rash    Sunburn    rash  . Percocet [Oxycodone-Acetaminophen] Nausea And Vomiting    Home medications: Prior to Admission medications   Medication Sig Start Date End Date Taking? Authorizing Provider  acetaminophen (TYLENOL) 500 MG tablet Take 1,000 mg by mouth every 6 (six) hours as needed for moderate pain or headache.    Yes Historical Provider, MD  bimatoprost (LUMIGAN) 0.01 % SOLN Place 1 drop into both eyes at bedtime.   Yes Historical Provider, MD  metoprolol tartrate (LOPRESSOR) 25 MG tablet TAKE 1/2 TABLET BY MOUTH TWICE DAILY Patient taking differently: Take one-half tablet (12.5 mg) by mouth twice daily. 09/02/15  Yes Jettie Booze, MD  Nutritional Supplements (FEEDING SUPPLEMENT, NEPRO CARB STEADY,) LIQD Take 237 mLs by mouth daily. Patient taking differently: Take 237 mLs by mouth daily as needed (nutritional supplement).  05/07/15  Yes Maryann Mikhail, DO  Nystatin ([place]) 100000 UNIT/GM POWD Apply 1 application topically 2 (two) times daily.  09/16/15  Yes Historical Provider, MD  nystatin cream (MYCOSTATIN) Apply 1 application topically 2 (two) times daily. Apply to affected area BID for up to 7 days. 10/28/15  Yes Kem Boroughs, FNP  ranitidine (ZANTAC) 150 MG tablet Take 150 mg by mouth 2 (two) times daily as needed for heartburn.   Yes Historical Provider, MD  saccharomyces boulardii (FLORASTOR) 250 MG capsule Take 1 capsule (250 mg total) by mouth 2 (two) times daily. 07/08/15  Yes Donne Hazel, MD  sodium bicarbonate 650 MG tablet Take 1 tablet (650 mg total) by mouth 2 (two) times daily. Patient taking differently: Take 650 mg by mouth 3 (three) times daily.  05/30/15  Yes Robbie Lis, MD  sodium chloride (OCEAN) 0.65 % SOLN nasal spray Place 1  spray into both nostrils 3 (three) times daily as needed for congestion.   Yes Historical Provider, MD  timolol (BETIMOL) 0.5 % ophthalmic  solution Place 1 drop into both eyes every morning.    Yes Historical Provider, MD  traMADol (ULTRAM) 50 MG tablet Take 1 tablet (50 mg total) by mouth every 6 (six) hours as needed. Patient taking differently: Take 50 mg by mouth every 6 (six) hours as needed for moderate pain or severe pain.  10/12/15  Yes Samantha J Rhyne, PA-C  triamcinolone (KENALOG) 0.025 % ointment Apply 1 application topically 2 (two) times daily. 10/28/15  Yes Kem Boroughs, FNP  fluconazole (DIFLUCAN) 150 MG tablet Take one tablet and repeat in 48 hours.  Then again 3 days later Patient not taking: Reported on 10/30/2015 10/28/15   Kem Boroughs, FNP    Inpatient medications: . [START ON 11/01/2015] ceFEPime (MAXIPIME) IV  1 g Intravenous Q24H  . famotidine  20 mg Oral BID  . heparin  5,000 Units Subcutaneous Q8H  . latanoprost  1 drop Both Eyes QHS  . metoprolol tartrate  12.5 mg Oral BID  . saccharomyces boulardii  250 mg Oral BID  . sodium bicarbonate  650 mg Oral TID  . timolol  1 drop Both Eyes q morning - 10a  . triamcinolone  1 application Topical BID   . sodium chloride 50 mL/hr at 10/31/15 0900   Review of Systems See HPI  Physical Exam: BP 123/56 mmHg  Pulse 77  Temp(Src) 97.5 F (36.4 C) (Oral)  Resp 18  SpO2 100%  LMP 01/19/1992 (Approximate) Gen: Very nice older WF NAD Fully oriented Skin: no rash, cyanosis Neck: no JVD, no bruits Chest: Clear Heart: Regular rhythm S1S2 normal No s3 or S4 No murmur Right TDC IJ (03/2015) exit site looks fine Abdomen: soft, no focal abd tenderness Right perc neph tube with dry dressing in place. Cloudy urine in bag and tubing Ext: No edema Left upper arm AVF with healing incisions. No drainage or wound dehiscence Neuro: alert, Ox3, no focal deficit   Labs: Basic Metabolic Panel:  Recent Labs Lab 10/29/15 1000  10/31/15 0009 10/31/15 1239  NA 138 137 138  K 5.0 5.2* 5.1  CL 105 104 108  CO2 19* 19* 17*  GLUCOSE 118* 100* 96  BUN 89* 82* 80*  CREATININE 8.35* 8.06* 7.39*  CALCIUM 9.2 9.4 8.6*  PHOS 6.0*  --   --      Recent Labs Lab 10/29/15 1000  ALBUMIN 2.4*     Recent Labs Lab 10/29/15 1012 10/31/15 0009  WBC  --  20.7*  NEUTROABS  --  18.3*  HGB 10.0* 9.7*  HCT  --  30.2*  MCV  --  83.9  PLT  --  385    Iron Studies:  Recent Labs Lab 10/29/15 1000  IRON 41  TIBC 175*  FERRITIN 312*   Blood cultures pending Urine culture pending  Xrays/Other Studies: Dg Chest Port 1 View  10/31/2015  CLINICAL DATA:  Fever.  Urinary tract infection. EXAM: PORTABLE CHEST 1 VIEW COMPARISON:  09/17/2015 FINDINGS: There is a dual-lumen right jugular central line extending into the low SVC. There is a hiatal hernia. Mild linear scarring or atelectasis in the left base. No confluent consolidation. No large effusion. No pneumothorax. IMPRESSION: Hiatal hernia.  Linear scarring or atelectasis in the left base. Electronically Signed   By: Andreas Newport M.D.   On: 10/31/2015 05:28   Background 70 y.o. year-old WF with known CKD5, recently (10/12/15)  left 2nd stage BVT AVF (1st stage was done 06/02/15) by Dr. Bridgett Larsson, HTN, R PCN tube (changed every  6 weeks in IR) Has R IJ TDC (placed 04/11/15, HD once, and gets weekly flush/heparin exchange in [facility]) Has had several admissions for fevers/UTI usually around the time of PCN  change (most recently prior to the current admission 09/2015).    She was brought to [facility] ED last PM with fever (at home to 100.9, and confusion.  She is due for tube change this week. Of note is that she had the [facility] flushed on Thursday, 1 day prior to onset of fever and confusion.   At time of admission to [facility], creatinine was 8.06 (6.43 early April) and at the time of Dr. Abel Presto last office appt he made the appt that she could "start dialysis at any time" but they have been trying  to wait out maturation of her AVF. She has had cultures done, ATB's started. WBC on adm 20,000.  Creatinine today after some IVF was down to 7.39. As of this evening, her confusion has cleared.  She denies edema, anorexia, nausea, vomiting or other uremic symptoms. She has been afebrile since admission. She has been transferred to [facility] in the event that HD is needed. We are asked to see.   1. CKD 5 - GFR <15 for some time.  Creatinine up to 8 at time of admission (most recently has been running in the 6's so not much change in GFR). Today 7.38 BUN 80 after IVF overnight. No uremic symptoms whatsoever. I think can stop IVF now that awake and alert. No emerging need for dialysis. Pt would really like to be able to postpone until AVF matured but has TDC in place if we do need to start.   2. Febrile illness - this time PRECEDING PCN change (BUT 1 day after TDC flush). Urinary vs TDC most likely sources. Afebrile since admission on cefipime/vanco. Cultures pending. Trending WBC 3. Confusion - MS has cleared. Related to fever/infection.  4. Anemia - on Aranesp 200 once a month at [facility]. Last dosed 5/11. TSat at that time 23, Hb 10. Current Hb 9.7 5. Chronic obstructive uropathy - due for R PCN change this week. Will try to get done while here.  6. Metabolic acidosis - mild. Continue oral sodium bicarbonate 7. R PCN - due for PCN exchange this week (every 6 weeks) - usually done at [facility] - will get done here while hospitalized.  Jamal Maes,  MD [facility] [phone] pager 10/31/2015, 6:27 PM

## 2015-10-31 NOTE — Progress Notes (Signed)
PROGRESS NOTE    [name]  [id number] DOB: 1946/01/20 DOA: 10/30/2015 PCP: [name], MD  Outpatient Specialists: Dr. Florene Glen    Brief Narrative:  70 y.o. female with medical history significant of CKD stage V, nephrolithiasis, HTN, CAD, anemia of chronic disease, s/p right nephrostomy tube; who presents with complaints of 1 day history of confusion and fever. Patient noted to have presenting WBC of 20k with UA suggestive of UTI. Patient was started on IVF and antibiotics. Of note, patient is also followed by Dr. Florene Glen for stage 5 CKD with fistula placed in the past few weeks. Presenting Cr noted to be 8.06 with BUN of 82.   Assessment & Plan:   Principal Problem:   Sepsis secondary to UTI [facility]) Active Problems:   Glaucoma   Essential hypertension, benign   Anemia of renal disease   CKD (chronic kidney disease) stage 5, GFR less than 15 ml/min (HCC)   Hyperkalemia  Sepsis secondary to UTI: Patient presented with 100.63F temp with elevated WBC of 20.7, and positive urinalysis. Previous history of Pseudomonas infection noted in the past. - Sepsis protocol initiated - BP stable with normal lactate, thus cont to hold off on aggressive fluids secondary to renal failure - Follow-up urine culture and blood cultures - Empiric vancomycin and cefepime started on admit  Chronic kidney disease stage V: Presenting Creatinine elevated at 8.06 with a BUN of 82 - K of 5.2, clinically not volume overloaded, appears mildly dehydrated - Have continued on 50cc/hr fluids - Renal diet - Strict intake and output - Continue sodium bicarbonate - Overnight, staff reports 250cc urine output. Nephrostomy tubes noted to be patent - Pt reportedly planned for eventual HD in the near future. Have discussed case with Nephrology who recommends tx to [facility] for closer monitoring  Shortness of breath: Patient with no peripheral signs of fluid overload at this time - Check chest x-ray  -  Oxygen as needed   Hyperkalemia: Acute. Potassium just mildly elevated at 5.2 - Continue with gentle IV fluids  - Repeat K of 5.1  Essential hypertension - Continued on metoprolol  Glaucoma  - continue home eyedrops   GERD - Cont H2 blocker  Anemia of chronic renal disease - Hgb stable, currently 9.7 (was 10 on 5/11)   DVT prophylaxis: Heparin subQ Code Status: Full Family Communication: Pt in room, family at bedside Disposition Plan: Transfer to [facility]   Consultants:   Nephrology  Procedures:     Antimicrobials: Anti-infectives    Start     Dose/Rate Route Frequency Ordered Stop   11/01/15 0600  ceFEPIme (MAXIPIME) 1 g in dextrose 5 % 50 mL IVPB     1 g 100 mL/hr over 30 Minutes Intravenous Every 24 hours 10/31/15 0441     10/31/15 0315  vancomycin (VANCOCIN) IVPB 1000 mg/200 mL premix     1,000 mg 200 mL/hr over 60 Minutes Intravenous  Once 10/31/15 0301 10/31/15 0416   10/31/15 0315  ceFEPIme (MAXIPIME) 1 g in dextrose 5 % 50 mL IVPB     1 g 100 mL/hr over 30 Minutes Intravenous  Once 10/31/15 0304 10/31/15 0610           Subjective: No complaints this AM. Denies sob  Objective: Filed Vitals:   10/31/15 0500 10/31/15 1334 10/31/15 1439 10/31/15 1701  BP:  113/53 141/60 123/56  Pulse:  72 75 77  Temp: 98.2 F (36.8 C) 97.9 F (36.6 C) 97.6 F (36.4 C) 97.5 F (36.4  C)  TempSrc: Oral Oral Oral Oral  Resp:  18 16 18   SpO2: 99% 100% 100% 100%    Intake/Output Summary (Last 24 hours) at 10/31/15 1847 Last data filed at 10/31/15 1800  Gross per 24 hour  Intake    900 ml  Output    875 ml  Net     25 ml   There were no vitals filed for this visit.  Examination:  General exam: Appears calm and comfortable, laying in bed Respiratory system: Clear to auscultation. Respiratory effort normal. Cardiovascular system: S1 & S2 heard, RRR. No pedal edema. Gastrointestinal system: Abdomen is nondistended, soft and nontender. No organomegaly or masses  felt. Normal bowel sounds heard. Central nervous system: Alert and oriented. No focal neurological deficits. Extremities: Symmetric 5 x 5 power. Skin: No rashes, lesions Psychiatry: Judgement and insight appear normal. Mood & affect appropriate.     Data Reviewed: I have personally reviewed following labs and imaging studies  CBC:  Recent Labs Lab 10/29/15 1012 10/31/15 0009  WBC  --  20.7*  NEUTROABS  --  18.3*  HGB 10.0* 9.7*  HCT  --  30.2*  MCV  --  83.9  PLT  --  0000000   Basic Metabolic Panel:  Recent Labs Lab 10/29/15 1000 10/31/15 0009 10/31/15 1239  NA 138 137 138  K 5.0 5.2* 5.1  CL 105 104 108  CO2 19* 19* 17*  GLUCOSE 118* 100* 96  BUN 89* 82* 80*  CREATININE 8.35* 8.06* 7.39*  CALCIUM 9.2 9.4 8.6*  PHOS 6.0*  --   --    GFR: Estimated Creatinine Clearance: 7.1 mL/min (by C-G formula based on Cr of 7.39). Liver Function Tests:  Recent Labs Lab 10/29/15 1000  ALBUMIN 2.4*   No results for input(s): LIPASE, AMYLASE in the last 168 hours. No results for input(s): AMMONIA in the last 168 hours. Coagulation Profile: No results for input(s): INR, PROTIME in the last 168 hours. Cardiac Enzymes: No results for input(s): CKTOTAL, CKMB, CKMBINDEX, TROPONINI in the last 168 hours. BNP (last 3 results) No results for input(s): PROBNP in the last 8760 hours. HbA1C: No results for input(s): HGBA1C in the last 72 hours. CBG: No results for input(s): GLUCAP in the last 168 hours. Lipid Profile: No results for input(s): CHOL, HDL, LDLCALC, TRIG, CHOLHDL, LDLDIRECT in the last 72 hours. Thyroid Function Tests: No results for input(s): TSH, T4TOTAL, FREET4, T3FREE, THYROIDAB in the last 72 hours. Anemia Panel:  Recent Labs  10/29/15 1000  FERRITIN 312*  TIBC 175*  IRON 41   Urine analysis:    Component Value Date/Time   COLORURINE YELLOW 10/31/2015 0034   APPEARANCEUR TURBID* 10/31/2015 0034   LABSPEC 1.011 10/31/2015 0034   PHURINE 6.0 10/31/2015  0034   GLUCOSEU NEGATIVE 10/31/2015 0034   HGBUR LARGE* 10/31/2015 0034   BILIRUBINUR NEGATIVE 10/31/2015 0034   BILIRUBINUR neg 09/18/2014 0920   KETONESUR NEGATIVE 10/31/2015 0034   PROTEINUR 100* 10/31/2015 0034   PROTEINUR 2+ 09/18/2014 0920   UROBILINOGEN 0.2 05/02/2015 2020   UROBILINOGEN negative 09/18/2014 0920   NITRITE NEGATIVE 10/31/2015 0034   NITRITE neg 09/18/2014 0920   LEUKOCYTESUR LARGE* 10/31/2015 0034   Sepsis Labs:  Recent Labs Lab 10/31/15 0021  LATICACIDVEN 0.97    Recent Results (from the past 240 hour(s))  MRSA PCR Screening     Status: None   Collection Time: 10/31/15  7:37 AM  Result Value Ref Range Status   MRSA by PCR NEGATIVE  NEGATIVE Final    Comment:        The GeneXpert MRSA Assay (FDA approved for NASAL specimens only), is one component of a comprehensive MRSA colonization surveillance program. It is not intended to diagnose MRSA infection nor to guide or monitor treatment for MRSA infections.          Radiology Studies: Dg Chest Port 1 View  10/31/2015  CLINICAL DATA:  Fever.  Urinary tract infection. EXAM: PORTABLE CHEST 1 VIEW COMPARISON:  09/17/2015 FINDINGS: There is a dual-lumen right jugular central line extending into the low SVC. There is a hiatal hernia. Mild linear scarring or atelectasis in the left base. No confluent consolidation. No large effusion. No pneumothorax. IMPRESSION: Hiatal hernia.  Linear scarring or atelectasis in the left base. Electronically Signed   By: Andreas Newport M.D.   On: 10/31/2015 05:28        Scheduled Meds: . [START ON 11/01/2015] ceFEPime (MAXIPIME) IV  1 g Intravenous Q24H  . famotidine  20 mg Oral BID  . heparin  5,000 Units Subcutaneous Q8H  . latanoprost  1 drop Both Eyes QHS  . metoprolol tartrate  12.5 mg Oral BID  . saccharomyces boulardii  250 mg Oral BID  . sodium bicarbonate  650 mg Oral TID  . timolol  1 drop Both Eyes q morning - 10a  . triamcinolone  1 application  Topical BID   Continuous Infusions: . sodium chloride 50 mL/hr at 10/31/15 0900     LOS: 0 days    Phoenyx Paulsen, Orpah Melter, MD [facility] Pager [phone]  If 7PM-7AM, please contact night-coverage [email or web address] Password [facility] 10/31/2015, 6:47 PM

## 2015-10-31 NOTE — Progress Notes (Signed)
Pharmacy Antibiotic Note  [name] is a 70 y.o. female admitted on 10/30/2015 with sepsis and UTI.  Pharmacy has been consulted for Vancomycin, cefepime dosing.  Patient has very poor renal function on admit  Plan: Vancomycin 1gm iv x1 (given),check level in 48hr with 5/15 AM labs (ordered), if SCr does not improve, but earlier if SCr does improve Cefepime 1gm x1, then 1gm iv q24hr until CrCl improves past 30 mL/min       Temp (24hrs), Avg:98.4 F (36.9 C), Min:98.2 F (36.8 C), Max:98.6 F (37 C)   Recent Labs Lab 10/29/15 1000 10/31/15 0009 10/31/15 0021  WBC  --  20.7*  --   CREATININE 8.35* 8.06*  --   LATICACIDVEN  --   --  0.97    Estimated Creatinine Clearance: 6.5 mL/min (by C-G formula based on Cr of 8.06).    Allergies  Allergen Reactions  . Vicodin [Hydrocodone-Acetaminophen] Nausea And Vomiting  . Chlorhexidine Rash    Sunburn    rash  . Percocet [Oxycodone-Acetaminophen] Nausea And Vomiting    Antimicrobials this admission: Cefepime 5/13 >> Vancomycin 5/13 >>  Dose adjustments this admission: -  Microbiology results: Pending  Thank you for allowing pharmacy to be a part of this patient's care.  Nani Skillern Crowford 10/31/2015 4:42 AM

## 2015-10-31 NOTE — Progress Notes (Signed)
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   Orders have been reviewed and implemented. Will continue to assess and monitor pt.  Carole Civil, RN, Lissa Morales

## 2015-10-31 NOTE — H&P (Addendum)
History and Physical    [name] [id number] DOB: 1946/01/09 DOA: 10/30/2015  Referring MD/NP/PA: Dr. Roxanne Mins PCP: [name], MD  Outpatient Specialists: Nephrology Dr. Florene Glen Patient coming from: Home  Chief Complaint: Confusion and fever  HPI: [name] is a 70 y.o. female with medical history significant of CKD stage V, nephrolithiasis, HTN, CAD, anemia of chronic disease, s/p right nephrostomy tube; who presents with complaints of 1 day history of confusion and fever. Symptoms started yesterday evening. Patient was noted to get very cold with chills and a family member checked her temperature and it was 100.85F which is very high for her. She took a Tylenol with some improvement of fever. Associated symptoms included a headache, some mild shortness of breath, and some confusion. These constellation of symptoms usually signify that the patient likely has a urinary tract infection. Patient notes that she normally gets a urinary check infection every month to 6 weeks and usually falls around the time spent for her to have her nephrostomy tube catheter changed out. She scheduled to have her next nephrostomy tube catheter changed out on the 18th. Daughter states that she just cleaned the bandages around approximately tube yesterday for which there are no signs of infection. Denies any change in urine output, significant weight changes, chest pain, rash, changes in vision, diarrhea, or focal weakness. Patient notes that 3 weeks ago she had a fistula placed in preparation for possible need of dialysis. Follows with Dr. Florene Glen of nephrology. She also has a port that can be accessed as well as need for hemodialysis. After being on antibiotics patient normally develops diarrhea, but states that the Florastor helps decrease this from happening.  ED Course: Upon admission into the emergency department patient's vital signs were seen to be within normal limits. WBC 20.7, hemoglobin 9.7,  platelets 385, potassium 5.2, CO2 19, chloride 104, BUN 82, creatinine 8.06. Urinalysis obtained was positive for rare bacteria, hyaline casts, large hemoglobin, large leukocytes, and too numerous to count WBCs. Started on vancomycin and cefepime the ED and was given 1 L of IV fluids. TRH called to admit.  Review of Systems: As per HPI otherwise 10 point review of systems negative.    Past Medical History  Diagnosis Date  . GERD (gastroesophageal reflux disease)   . Glaucoma   . H/O renal calculi 2002 & 2006  . H/O hiatal hernia   . Headache(784.0)     migraine  . Pneumonia     dx 10-06-2014 per CXR--  on 10-27-2014 pt states finished antibiotic and denies cough or fever  . Hyperlipidemia   . History of MI (myocardial infarction)     10/ 2013 in setting of Septic Shock  . History of atrial fibrillation     10/ 2013  in setting of Septic Shock  . History of CHF (congestive heart failure)     10/ 2013 in setting of septic shock  . Sigmoid diverticulosis   . Nephrolithiasis     bilateral  . CKD (chronic kidney disease), stage III     nephrologist-  dr Florene Glen  . Right ureteral stone   . History of acute respiratory failure     10/ 2013  -- ventilated in setting septic shock  . Dysrhythmia     Afib in 2013 when she had septic shock related to stone ureteral obstruction  . Myocardial infarction [facility])     Was reported in 2013 during hospitalization with septic shock  . Septic shock ([place]) 04/04/2012  .  Sepsis ([place])   . Hypertension     medication removed from regimen due to low blood pressure   . Peripheral vascular disease ([place])   . S/P hemodialysis catheter insertion ([place]) 04/11/2015     right anterior chest , only used once   . History of nephrostomy 04/11/2015     currently inplace 04/28/2015   . History of blood transfusion 04/13/2015   . Low iron     hx  . Glaucoma   . History of blood product transfusion   . Coronary artery disease   . Complication of anesthesia      use a little anesthesia , per patient MD states she quit breathing (2016); hard to wake up    Past Surgical History  Procedure Laterality Date  . Cystoscopy w/ ureteral stent placement  04/04/2012    Procedure: CYSTOSCOPY WITH RETROGRADE PYELOGRAM/URETERAL STENT PLACEMENT;  Surgeon: Ailene Rud, MD;  Location: [place];  Service: Urology;  Laterality: Left;  . Total abdominal hysterectomy w/ bilateral salpingoophorectomy  1993    secondary to fibroids  . Breast biopsy Left 08/23/07    benign fibrocystic with duct ectasia  . Cardiac catheterization  07-11-2012  dr Irish Lack    Abnormal stress test/   normal coronary arteries/  LVEDP  52mmHg  . Cardiovascular stress test  06-26-2012  dr Irish Lack    marked ischemia in the basal anterior, mid anterior, apical inferior regions/  normal LVF, ef 63%  . Transthoracic echocardiogram  04-09-2012    normal LVF,  ef 60-65%,  mild LAE,  mild TR, trivial MR and PR  . Cataract extraction w/ intraocular lens  implant, bilateral    . Knee arthroscopy Left 02-14-2003  . Laparoscopic cholecystectomy  03-23-2005  . Extracorporeal shock wave lithotripsy  05-28-2012  &  10-08-2012  . Cystoscopy with stent placement Right 10/28/2014    Procedure: RIGHT URETERAL STENT PLACEMENT;  Surgeon: Irine Seal, MD;  [facility];  Service: Urology;  Laterality: Right;  . Cystoscopy/retrograde/ureteroscopy/stone extraction with basket Right 11/21/2014    Procedure: CYSTOSCOPY/RIGHT RETROGRADE PYELOGRAM/RIGHT URETEROSCOPY/BASKET EXTRACTION/RIGHT PYELOSCOPY/LASER OF STONE/RIGHT DOUBLE J STENT;  Surgeon: Carolan Clines, MD;  Location: [place];  Service: Urology;  Laterality: Right;  . Holmium laser application Right [id number]    Procedure: HOLMIUM LASER APPLICATION;  Surgeon: Carolan Clines, MD;  Location[facility];  Service: Urology;  Laterality: Right;  . Cystoscopy with stent placement Right 02/26/2015     Procedure: CYSTOSCOPY RETROGRADE PYELOGRAM WITH STENT PLACEMENT;  Surgeon: Cleon Gustin, MD;  Location: [facility];  Service: Urology;  Laterality: Right;  . Cystoscopy w/ ureteral stent placement Bilateral 05/04/2015    Procedure: CYSTOSCOPY WITH BILATERAL RETROGRADE PYELOGRAM/ WITH INTERPRETATION, EXCHANGE OF RIGHT URETERAL STENT REPLACEMENT AND PLACEMENT LEFT URETERAL STENT PLACEMENT EXAMINATION OF VAGINA;  Surgeon: Carolan Clines, MD;  Location: [facility];  Service: Urology;  Laterality: Bilateral;  . Av fistula placement Left 06/02/2015    Procedure: BRACHIOCEPHALIC ARTERIOVENOUS (AV) FISTULA CREATION ;  Surgeon: Conrad Coburg, MD;  Location: [place];  Service: Vascular;  Laterality: Left;  . Abdominal hysterectomy    . Bascilic vein transposition Left 07/27/2015    Procedure: FIRST STAGE BASILIC VEIN TRANSPOSITION LEFT UPPER ARM;  Surgeon: Conrad Pryor Creek, MD;  Location: [place];  Service: Vascular;  Laterality: Left;  . Bascilic vein transposition Left 09/2015    second phase  . Bascilic vein transposition Left 10/12/2015    Procedure: SECOND STAGE BASILIC  VEIN TRANSPOSITION LEFT ARM;  Surgeon: Conrad Keizer, MD;  Location: [place];  Service: Vascular;  Laterality: Left;     reports that she has never smoked. She has never used smokeless tobacco. She reports that she does not drink alcohol or use illicit drugs.  Allergies  Allergen Reactions  . Vicodin [Hydrocodone-Acetaminophen] Nausea And Vomiting  . Chlorhexidine Rash    Sunburn    rash  . Percocet [Oxycodone-Acetaminophen] Nausea And Vomiting    Family History  Problem Relation Age of Onset  . Hypertension Mother   . Cancer Mother 37    breast  . Dementia Mother   . Hypertension Brother   . Diabetes Brother   . Heart disease Brother     before age 68  . Cancer Father 38    pancreatic  . Heart failure Paternal Grandmother   . Bladder Cancer Maternal Grandfather     Prior to Admission medications   Medication Sig Start Date End  Date Taking? Authorizing Provider  acetaminophen (TYLENOL) 500 MG tablet Take 1,000 mg by mouth every 6 (six) hours as needed for moderate pain or headache.    Yes Historical Provider, MD  bimatoprost (LUMIGAN) 0.01 % SOLN Place 1 drop into both eyes at bedtime.   Yes Historical Provider, MD  metoprolol tartrate (LOPRESSOR) 25 MG tablet TAKE 1/2 TABLET BY MOUTH TWICE DAILY Patient taking differently: Take one-half tablet (12.5 mg) by mouth twice daily. 09/02/15  Yes Jettie Booze, MD  Nutritional Supplements (FEEDING SUPPLEMENT, NEPRO CARB STEADY,) LIQD Take 237 mLs by mouth daily. Patient taking differently: Take 237 mLs by mouth daily as needed (nutritional supplement).  05/07/15  Yes Maryann Mikhail, DO  Nystatin ([place]) 100000 UNIT/GM POWD Apply 1 application topically 2 (two) times daily.  09/16/15  Yes Historical Provider, MD  nystatin cream (MYCOSTATIN) Apply 1 application topically 2 (two) times daily. Apply to affected area BID for up to 7 days. 10/28/15  Yes Kem Boroughs, FNP  ranitidine (ZANTAC) 150 MG tablet Take 150 mg by mouth 2 (two) times daily as needed for heartburn.   Yes Historical Provider, MD  saccharomyces boulardii (FLORASTOR) 250 MG capsule Take 1 capsule (250 mg total) by mouth 2 (two) times daily. 07/08/15  Yes Donne Hazel, MD  sodium bicarbonate 650 MG tablet Take 1 tablet (650 mg total) by mouth 2 (two) times daily. Patient taking differently: Take 650 mg by mouth 3 (three) times daily.  05/30/15  Yes Robbie Lis, MD  sodium chloride (OCEAN) 0.65 % SOLN nasal spray Place 1 spray into both nostrils 3 (three) times daily as needed for congestion.   Yes Historical Provider, MD  timolol (BETIMOL) 0.5 % ophthalmic solution Place 1 drop into both eyes every morning.    Yes Historical Provider, MD  traMADol (ULTRAM) 50 MG tablet Take 1 tablet (50 mg total) by mouth every 6 (six) hours as needed. Patient taking differently: Take 50 mg by mouth every 6 (six) hours as  needed for moderate pain or severe pain.  10/12/15  Yes Samantha J Rhyne, PA-C  triamcinolone (KENALOG) 0.025 % ointment Apply 1 application topically 2 (two) times daily. 10/28/15  Yes Kem Boroughs, FNP  fluconazole (DIFLUCAN) 150 MG tablet Take one tablet and repeat in 48 hours.  Then again 3 days later Patient not taking: Reported on 10/30/2015 10/28/15   Kem Boroughs, FNP    Physical Exam: Filed Vitals:   10/30/15 2322 10/31/15 0254  BP: 124/65 124/65  Pulse: 74  74  Temp: 98.2 F (36.8 C) 98.6 F (37 C)  TempSrc: Oral Oral  Resp: 20 20  SpO2: 98% 98%      Constitutional:Elderly female in some mild distress, but alert and able to follow commands.  Filed Vitals:   10/30/15 2322 10/31/15 0254  BP: 124/65 124/65  Pulse: 74 74  Temp: 98.2 F (36.8 C) 98.6 F (37 C)  TempSrc: Oral Oral  Resp: 20 20  SpO2: 98% 98%   Eyes: PERRL, lids and conjunctivae normal ENMT: Mucous membranes are moist. Posterior pharynx clear of any exudate or lesions.Normal dentition.  Neck: normal, supple, no masses, no thyromegaly Respiratory: clear to auscultation bilaterally, no wheezing, no crackles. Normal respiratory effort. No accessory muscle use.  Cardiovascular: Regular rate and rhythm, no murmurs / rubs / gallops. No extremity edema. 2+ pedal pulses. No carotid bruits. Port present at the right upper chest Abdomen: no tenderness, no masses palpated. No hepatosplenomegaly. Bowel sounds positive.  nephrostomy tube on the right side of the back Musculoskeletal: no clubbing / cyanosis. No joint deformity upper and lower extremities. Good ROM, no contractures. Normal muscle tone.  Skin: no rashes, lesions, ulcers. No induration Neurologic: CN 2-12 grossly intact. Sensation intact, DTR normal. Strength 5/5 in all 4.  Psychiatric: Normal judgment and insight. Alert and oriented x 3. Normal mood.     Labs on Admission: I have personally reviewed following labs and imaging studies  CBC:  Recent  Labs Lab 10/29/15 1012 10/31/15 0009  WBC  --  20.7*  NEUTROABS  --  18.3*  HGB 10.0* 9.7*  HCT  --  30.2*  MCV  --  83.9  PLT  --  0000000   Basic Metabolic Panel:  Recent Labs Lab 10/29/15 1000 10/31/15 0009  NA 138 137  K 5.0 5.2*  CL 105 104  CO2 19* 19*  GLUCOSE 118* 100*  BUN 89* 82*  CREATININE 8.35* 8.06*  CALCIUM 9.2 9.4  PHOS 6.0*  --    GFR: Estimated Creatinine Clearance: 6.5 mL/min (by C-G formula based on Cr of 8.06). Liver Function Tests:  Recent Labs Lab 10/29/15 1000  ALBUMIN 2.4*   No results for input(s): LIPASE, AMYLASE in the last 168 hours. No results for input(s): AMMONIA in the last 168 hours. Coagulation Profile: No results for input(s): INR, PROTIME in the last 168 hours. Cardiac Enzymes: No results for input(s): CKTOTAL, CKMB, CKMBINDEX, TROPONINI in the last 168 hours. BNP (last 3 results) No results for input(s): PROBNP in the last 8760 hours. HbA1C: No results for input(s): HGBA1C in the last 72 hours. CBG: No results for input(s): GLUCAP in the last 168 hours. Lipid Profile: No results for input(s): CHOL, HDL, LDLCALC, TRIG, CHOLHDL, LDLDIRECT in the last 72 hours. Thyroid Function Tests: No results for input(s): TSH, T4TOTAL, FREET4, T3FREE, THYROIDAB in the last 72 hours. Anemia Panel:  Recent Labs  10/29/15 1000  FERRITIN 312*  TIBC 175*  IRON 41   Urine analysis:    Component Value Date/Time   COLORURINE YELLOW 10/31/2015 0034   APPEARANCEUR TURBID* 10/31/2015 0034   LABSPEC 1.011 10/31/2015 0034   PHURINE 6.0 10/31/2015 0034   GLUCOSEU NEGATIVE 10/31/2015 0034   HGBUR LARGE* 10/31/2015 0034   BILIRUBINUR NEGATIVE 10/31/2015 0034   BILIRUBINUR neg 09/18/2014 0920   KETONESUR NEGATIVE 10/31/2015 0034   PROTEINUR 100* 10/31/2015 0034   PROTEINUR 2+ 09/18/2014 0920   UROBILINOGEN 0.2 05/02/2015 2020   UROBILINOGEN negative 09/18/2014 0920   NITRITE NEGATIVE 10/31/2015  0034   NITRITE neg 09/18/2014 0920    LEUKOCYTESUR LARGE* 10/31/2015 0034   Sepsis Labs: No results found for this or any previous visit (from the past 240 hour(s)).   Radiological Exams on Admission: No results found.    Assessment/Plan Sepsis secondary to UTI: Patient with reports of fever up to 100.40F(pta) with elevated WBC of 20.7, and positive urinalysis. Previous history of Pseudomonas infection noted in the past. - Admit to MedSurg - Sepsis protocol initiated held off on aggressive fluids secondary to renal failure - Follow-up urine culture and blood cultures - Antibiotics of vancomycin and cefepime given frequent hospitalizations, de-escalate therapy when able - Tylenol when necessary pain   Chronic kidney disease stage V: Creatinine elevated at 8.06 with a BUN of 82, but still with adequate urine output. Patient has access for possible future need of hemodialysis with a port and recently had a fistula placed 3 weeks ago.  - Renal diet - Strict intake and output - Continue sodium bicarbonate - Notify nephrology that the patient's here  Shortness of breath: Patient with no peripheral signs of fluid overload at this time - Check chest x-ray  - Oxygen as needed   Hyperkalemia: Acute. Potassium just mildly elevated at 5.2 - Check EKG - Continue with gentle IV fluids  - Recheck BMP at noon  Essential hypertension - Continue metoprolol  Glaucoma  - continue home eyedrops   GERD - Pharmacy substitution of Pepcid for ranitidine  Anemia of chronic renal disease - Continue to monitor  DVT prophylaxis:  heparin  Code Status: Full Family Communication: Discussed plan with daughter present at bedside  Disposition Plan: Possible discharge home in 2-3  days Consults called: None Admission status: Inpatient MedSurg   Norval Morton MD [facility] Pager [phone]  If 7PM-7AM, please contact night-coverage [email or web address] Password [facility]  10/31/2015, 3:45 AM

## 2015-11-01 LAB — RENAL FUNCTION PANEL
Albumin: 2.4 g/dL — ABNORMAL LOW (ref 3.5–5.0)
Anion gap: 14 (ref 5–15)
BUN: 73 mg/dL — ABNORMAL HIGH (ref 6–20)
CO2: 16 mmol/L — ABNORMAL LOW (ref 22–32)
Calcium: 9 mg/dL (ref 8.9–10.3)
Chloride: 108 mmol/L (ref 101–111)
Creatinine, Ser: 7.69 mg/dL — ABNORMAL HIGH (ref 0.44–1.00)
GFR calc Af Amer: 5 mL/min — ABNORMAL LOW (ref >60)
GFR calc non Af Amer: 5 mL/min — ABNORMAL LOW (ref >60)
Glucose, Bld: 86 mg/dL (ref 65–99)
Phosphorus: 5.3 mg/dL — ABNORMAL HIGH (ref 2.5–4.6)
Potassium: 5 mmol/L (ref 3.5–5.1)
Sodium: 138 mmol/L (ref 135–145)

## 2015-11-01 LAB — CBC
HCT: 30.6 % — ABNORMAL LOW (ref 36.0–46.0)
Hemoglobin: 9.4 g/dL — ABNORMAL LOW (ref 12.0–15.0)
MCH: 27 pg (ref 26.0–34.0)
MCHC: 30.7 g/dL (ref 30.0–36.0)
MCV: 87.9 fL (ref 78.0–100.0)
Platelets: 344 10*3/uL (ref 150–400)
RBC: 3.48 MIL/uL — ABNORMAL LOW (ref 3.87–5.11)
RDW: 17.4 % — ABNORMAL HIGH (ref 11.5–15.5)
WBC: 8.7 10*3/uL (ref 4.0–10.5)

## 2015-11-01 LAB — GLUCOSE, CAPILLARY: Glucose-Capillary: 73 mg/dL (ref 65–99)

## 2015-11-01 MED ORDER — SODIUM BICARBONATE 650 MG PO TABS
1300.0000 mg | ORAL_TABLET | Freq: Three times a day (TID) | ORAL | Status: DC
  Administered 2015-11-01 – 2015-11-03 (×7): 1300 mg via ORAL
  Filled 2015-11-01 (×7): qty 2

## 2015-11-01 MED ORDER — VANCOMYCIN HCL 500 MG IV SOLR
500.0000 mg | Freq: Once | INTRAVENOUS | Status: AC
  Administered 2015-11-01: 500 mg via INTRAVENOUS
  Filled 2015-11-01: qty 500

## 2015-11-01 MED ORDER — NEPRO/CARBSTEADY PO LIQD
237.0000 mL | Freq: Three times a day (TID) | ORAL | Status: DC
  Administered 2015-11-02 – 2015-11-11 (×12): 237 mL via ORAL
  Filled 2015-11-01 (×27): qty 237

## 2015-11-01 NOTE — Progress Notes (Signed)
Encounter reviewed by Dr. Brook Amundson C. Silva.  

## 2015-11-01 NOTE — Progress Notes (Signed)
Pharmacy Antibiotic Note  [name] is a 70 y.o. female admitted on 10/30/2015 with sepsis secondary to UTI.  Pharmacy has been consulted for vancomycin/cefepime dosing. Patient has already received vancomycin 1g in the ED and is on cefepime 1g IV q24h. Patient with poor renal function, SCr 7.69 but good UOP of 0.7 cc/kg/hr.   Plan: - Will give an additional vancomycin 500 mg to complete loading dose (1500 mg total) - Vancomycin random with AM labs tomorrow - Continue cefepime 1g IV q24h - Monitor C&S, CBC and LOT  Height: 5\' 3"  (160 cm) Weight: 183 lb 11.2 oz (83.326 kg) IBW/kg (Calculated) : 52.4  Temp (24hrs), Avg:97.7 F (36.5 C), Min:97.5 F (36.4 C), Max:97.9 F (36.6 C)   Recent Labs Lab 10/29/15 1000 10/31/15 0009 10/31/15 0021 10/31/15 1239 11/01/15 0608  WBC  --  20.7*  --   --  8.7  CREATININE 8.35* 8.06*  --  7.39* 7.69*  LATICACIDVEN  --   --  0.97  --   --     Estimated Creatinine Clearance: 7 mL/min (by C-G formula based on Cr of 7.69).    Allergies  Allergen Reactions  . Vicodin [Hydrocodone-Acetaminophen] Nausea And Vomiting  . Chlorhexidine Rash    Sunburn    rash  . Percocet [Oxycodone-Acetaminophen] Nausea And Vomiting    Antimicrobials this admission: Vancomycin 5/13 >> Cefepime 5/13 >>  Microbiology results: 5/13 BCx -  5/13 UCx - >100K pseudomonas 5/13 MRSA PCR - neg  Thank you for allowing pharmacy to be a part of this patient's care.  Dimitri Ped, PharmD. PGY-1 Pharmacy Resident Pager: [phone] 11/01/2015 11:26 AM

## 2015-11-01 NOTE — Progress Notes (Signed)
PROGRESS NOTE    [name]  [id number] DOB: 03/19/1946 DOA: 10/30/2015 PCP: [name], MD  Outpatient Specialists: Dr. Florene Glen    Brief Narrative:  70 y.o. female with medical history significant of CKD stage V, nephrolithiasis, HTN, CAD, anemia of chronic disease, s/p right nephrostomy tube; who presents with complaints of 1 day history of confusion and fever. Patient noted to have presenting WBC of 20k with UA suggestive of UTI. Patient was started on IVF and antibiotics. Of note, patient is also followed by Dr. Florene Glen for stage 5 CKD with fistula placed in the past few weeks. Presenting Cr noted to be 8.06 with BUN of 82.   Assessment & Plan:   Principal Problem:   Sepsis secondary to UTI [facility]) Active Problems:   Glaucoma   Essential hypertension, benign   Anemia of renal disease   CKD (chronic kidney disease) stage 5, GFR less than 15 ml/min (HCC)   Hyperkalemia  Sepsis secondary to UTI:  - Sepsis criteria with 100.88F temp , WBC of 20.7, and positive urinalysis. Previous history of Pseudomonas infection noted in the past. - Follow-up urine culture and blood cultures - Empiric vancomycin and cefepime started on admission, - Will continue with vancomycin given she has tunneled dialysis catheter pending cultures.  Chronic kidney disease stage V:  - Presenting Creatinine elevated at 8.06 with a BUN of 82, he sliding creatinine around 6 - Renal consult greatly appreciated, management per renal, continue to hold IV fluids for now, - Improving, creatinine is 7.6 today - Patient has aVF, and pelvis and tunneled dialysis catheter if dialysis is needed to be started.  Chronic obstructive uropathy - Patient with right nephrostomy tube, supposed to be changed this week, renal consulted IR  Acute encephalopathy  - In the setting of infectious process, resolved,  Essential hypertension - Continued on metoprolol  Glaucoma  - continue home eyedrops   GERD -  Cont H2 blocker  Anemia of chronic renal disease - Aranesp per renal   DVT prophylaxis: Heparin subQ Code Status: Full Family Communication: None at bedside Disposition Plan: Pending further workup   Consultants:   Nephrology  Procedures:     Antimicrobials: Anti-infectives    Start     Dose/Rate Route Frequency Ordered Stop   11/01/15 0600  ceFEPIme (MAXIPIME) 1 g in dextrose 5 % 50 mL IVPB     1 g 100 mL/hr over 30 Minutes Intravenous Every 24 hours 10/31/15 0441     10/31/15 0315  vancomycin (VANCOCIN) IVPB 1000 mg/200 mL premix     1,000 mg 200 mL/hr over 60 Minutes Intravenous  Once 10/31/15 0301 10/31/15 0416   10/31/15 0315  ceFEPIme (MAXIPIME) 1 g in dextrose 5 % 50 mL IVPB     1 g 100 mL/hr over 30 Minutes Intravenous  Once 10/31/15 0304 10/31/15 0610          Subjective: No complaints this AM. Denies sob  Objective: Filed Vitals:   10/31/15 1701 10/31/15 2131 11/01/15 0510 11/01/15 0900  BP: 123/56 137/67 137/61 125/54  Pulse: 77 75 73 85  Temp: 97.5 F (36.4 C) 97.7 F (36.5 C) 97.7 F (36.5 C) 97.6 F (36.4 C)  TempSrc: Oral Oral Oral Oral  Resp: 18 20 18 18   Height:  5\' 3"  (1.6 m)    Weight:  83.326 kg (183 lb 11.2 oz)    SpO2: 100% 99% 100% 97%    Intake/Output Summary (Last 24 hours) at 11/01/15 1106 Last data filed  at 11/01/15 0600  Gross per 24 hour  Intake   1010 ml  Output   1250 ml  Net   -240 ml   Filed Weights   10/31/15 2131  Weight: 83.326 kg (183 lb 11.2 oz)    Examination:  General exam: Appears calm and comfortable, laying in bed Respiratory system: Clear to auscultation. Respiratory effort normal. Cardiovascular system: S1 & S2 heard, RRR. No pedal edema. Gastrointestinal system: Abdomen is nondistended, soft and nontender. No organomegaly or masses felt. Normal bowel sounds heard, right nephrostomy tube. Central nervous system: Alert and oriented. No focal neurological deficits. Extremities: Symmetric 5 x 5  power. Skin: No rashes, lesions Psychiatry: Judgement and insight appear normal. Mood & affect appropriate.     Data Reviewed: I have personally reviewed following labs and imaging studies  CBC:  Recent Labs Lab 10/29/15 1012 10/31/15 0009 11/01/15 0608  WBC  --  20.7* 8.7  NEUTROABS  --  18.3*  --   HGB 10.0* 9.7* 9.4*  HCT  --  30.2* 30.6*  MCV  --  83.9 87.9  PLT  --  [id number]   Basic Metabolic Panel:  Recent Labs Lab 10/29/15 1000 10/31/15 0009 10/31/15 1239 11/01/15 0608  NA 138 137 138 138  K 5.0 5.2* 5.1 5.0  CL 105 104 108 108  CO2 19* 19* 17* 16*  GLUCOSE 118* 100* 96 86  BUN 89* 82* 80* 73*  CREATININE 8.35* 8.06* 7.39* 7.69*  CALCIUM 9.2 9.4 8.6* 9.0  PHOS 6.0*  --   --  5.3*   GFR: Estimated Creatinine Clearance: 7 mL/min (by C-G formula based on Cr of 7.69). Liver Function Tests:  Recent Labs Lab 10/29/15 1000 11/01/15 0608  ALBUMIN 2.4* 2.4*   No results for input(s): LIPASE, AMYLASE in the last 168 hours. No results for input(s): AMMONIA in the last 168 hours. Coagulation Profile: No results for input(s): INR, PROTIME in the last 168 hours. Cardiac Enzymes: No results for input(s): CKTOTAL, CKMB, CKMBINDEX, TROPONINI in the last 168 hours. BNP (last 3 results) No results for input(s): PROBNP in the last 8760 hours. HbA1C: No results for input(s): HGBA1C in the last 72 hours. CBG:  Recent Labs Lab 11/01/15 0227  GLUCAP 73   Lipid Profile: No results for input(s): CHOL, HDL, LDLCALC, TRIG, CHOLHDL, LDLDIRECT in the last 72 hours. Thyroid Function Tests: No results for input(s): TSH, T4TOTAL, FREET4, T3FREE, THYROIDAB in the last 72 hours. Anemia Panel: No results for input(s): VITAMINB12, FOLATE, FERRITIN, TIBC, IRON, RETICCTPCT in the last 72 hours. Urine analysis:    Component Value Date/Time   COLORURINE YELLOW 10/31/2015 0034   APPEARANCEUR TURBID* 10/31/2015 0034   LABSPEC 1.011 10/31/2015 0034   PHURINE 6.0 10/31/2015  0034   GLUCOSEU NEGATIVE 10/31/2015 0034   HGBUR LARGE* 10/31/2015 0034   BILIRUBINUR NEGATIVE 10/31/2015 0034   BILIRUBINUR neg 09/18/2014 0920   KETONESUR NEGATIVE 10/31/2015 0034   PROTEINUR 100* 10/31/2015 0034   PROTEINUR 2+ 09/18/2014 0920   UROBILINOGEN 0.2 05/02/2015 2020   UROBILINOGEN negative 09/18/2014 0920   NITRITE NEGATIVE 10/31/2015 0034   NITRITE neg 09/18/2014 0920   LEUKOCYTESUR LARGE* 10/31/2015 0034   Sepsis Labs:  Recent Labs Lab 10/31/15 0021  LATICACIDVEN 0.97    Recent Results (from the past 240 hour(s))  Culture, blood (routine x 2)     Status: None (Preliminary result)   Collection Time: 10/31/15 12:10 AM  Result Value Ref Range Status   Specimen Description BLOOD RIGHT  FOREARM  Final   Special Requests BOTTLES DRAWN AEROBIC AND ANAEROBIC 5CC  Final   Culture   Final    NO GROWTH 1 DAY Performed at [facility]    Report Status PENDING  Incomplete  Urine culture     Status: Abnormal (Preliminary result)   Collection Time: 10/31/15 12:34 AM  Result Value Ref Range Status   Specimen Description URINE, RANDOM  Final   Special Requests NONE  Final   Culture >=100,000 COLONIES/mL PSEUDOMONAS AERUGINOSA (A)  Final   Report Status PENDING  Incomplete  Culture, blood (routine x 2)     Status: None (Preliminary result)   Collection Time: 10/31/15 12:56 AM  Result Value Ref Range Status   Specimen Description BLOOD RIGHT ARM  Final   Special Requests BOTTLES DRAWN AEROBIC AND ANAEROBIC 10CC  Final   Culture   Final    NO GROWTH 1 DAY Performed at [facility]    Report Status PENDING  Incomplete  MRSA PCR Screening     Status: None   Collection Time: 10/31/15  7:37 AM  Result Value Ref Range Status   MRSA by PCR NEGATIVE NEGATIVE Final    Comment:        The GeneXpert MRSA Assay (FDA approved for NASAL specimens only), is one component of a comprehensive MRSA colonization surveillance program. It is not intended to diagnose  MRSA infection nor to guide or monitor treatment for MRSA infections.          Radiology Studies: Dg Chest Port 1 View  10/31/2015  CLINICAL DATA:  Fever.  Urinary tract infection. EXAM: PORTABLE CHEST 1 VIEW COMPARISON:  09/17/2015 FINDINGS: There is a dual-lumen right jugular central line extending into the low SVC. There is a hiatal hernia. Mild linear scarring or atelectasis in the left base. No confluent consolidation. No large effusion. No pneumothorax. IMPRESSION: Hiatal hernia.  Linear scarring or atelectasis in the left base. Electronically Signed   By: Andreas Newport M.D.   On: 10/31/2015 05:28        Scheduled Meds: . ceFEPime (MAXIPIME) IV  1 g Intravenous Q24H  . famotidine  20 mg Oral BID  . heparin  5,000 Units Subcutaneous Q8H  . latanoprost  1 drop Both Eyes QHS  . metoprolol tartrate  12.5 mg Oral BID  . saccharomyces boulardii  250 mg Oral BID  . sodium bicarbonate  650 mg Oral TID  . timolol  1 drop Both Eyes q morning - 10a  . triamcinolone  1 application Topical BID   Continuous Infusions:     LOS: 1 day    Yasin Ducat, MD [facility] Pager [phone]  If 7PM-7AM, please contact night-coverage [email or web address] Password [email or web address] 11/01/2015, 11:06 AM

## 2015-11-01 NOTE — Progress Notes (Signed)
[facility] Rounding Note  Subjective/Interval Events:  Feeling fine now Asking for Nepro for lunch meal No nausea Urine culture + pseudomonas  Objective Vital signs in last 24 hours: Filed Vitals:   10/31/15 1439 10/31/15 1701 10/31/15 2131 11/01/15 0510  BP: 141/60 123/56 137/67 137/61  Pulse: 75 77 75 73  Temp: 97.6 F (36.4 C) 97.5 F (36.4 C) 97.7 F (36.5 C) 97.7 F (36.5 C)  TempSrc: Oral Oral Oral Oral  Resp: 16 18 20 18   Height:   5\' 3"  (1.6 m)   Weight:   83.326 kg (183 lb 11.2 oz)   SpO2: 100% 100% 99% 100%   Weight change:   Intake/Output Summary (Last 24 hours) at 11/01/15 0626 Last data filed at 11/01/15 0600  Gross per 24 hour  Intake   1310 ml  Output   1375 ml  Net    -65 ml   Physical Exam:  BP 137/61 mmHg  Pulse 73  Temp(Src) 97.7 F (36.5 C) (Oral)  Resp 18  Ht 5\' 3"  (1.6 m)  Wt 83.326 kg (183 lb 11.2 oz)  BMI 32.55 kg/m2  SpO2 100%  LMP 01/19/1992 (Approximate) Gen: Very nice older WF NAD Neck: no JVD, no bruits Chest: Clear Heart: Regular rhythm S1S2 normal No s3 or S4 No murmur Right TDC IJ (03/2015)  Abdomen: soft, no focal abd tenderness Right perc neph tube with dry dressing in place. Cloudy urine in bag and tubing Ext: No edema of LE's Left upper arm AVF with healing incisions. No drainage or wound dehiscence Neuro: alert, Ox3, no focal deficit   Labs:   Recent Labs Lab 10/29/15 1000 10/31/15 0009 10/31/15 1239 11/01/15 0608  NA 138 137 138 138  K 5.0 5.2* 5.1 5.0  CL 105 104 108 108  CO2 19* 19* 17* 16*  GLUCOSE 118* 100* 96 86  BUN 89* 82* 80* 73*  CREATININE 8.35* 8.06* 7.39* 7.69*  CALCIUM 9.2 9.4 8.6* 9.0  PHOS 6.0*  --   --  5.3*     Recent Labs Lab 10/29/15 1000  ALBUMIN 2.4*     Recent Labs Lab 10/29/15 1012 10/31/15 0009 11/01/15 0608  WBC  --  20.7* 8.7  NEUTROABS  --  18.3*  --   HGB 10.0* 9.7* 9.4*  HCT  --  30.2* 30.6*  MCV  --  83.9 87.9  PLT  --  385 344     Recent Labs Lab  11/01/15 0227  GLUCAP 73    Iron Studies:  Recent Labs Lab 10/29/15 1000  IRON 41  TIBC 175*  FERRITIN 312*   Studies/Results: Dg Chest Port 1 View  10/31/2015  CLINICAL DATA:  Fever.  Urinary tract infection. EXAM: PORTABLE CHEST 1 VIEW COMPARISON:  09/17/2015 FINDINGS: There is a dual-lumen right jugular central line extending into the low SVC. There is a hiatal hernia. Mild linear scarring or atelectasis in the left base. No confluent consolidation. No large effusion. No pneumothorax. IMPRESSION: Hiatal hernia.  Linear scarring or atelectasis in the left base. Electronically Signed   By: Andreas Newport M.D.   On: 10/31/2015 05:28   Medications:   . ceFEPime (MAXIPIME) IV  1 g Intravenous Q24H  . famotidine  20 mg Oral BID  . heparin  5,000 Units Subcutaneous Q8H  . latanoprost  1 drop Both Eyes QHS  . metoprolol tartrate  12.5 mg Oral BID  . saccharomyces boulardii  250 mg Oral BID  . sodium bicarbonate  650 mg  Oral TID  . timolol  1 drop Both Eyes q morning - 10a  . triamcinolone  1 application Topical BID  . vancomycin  500 mg Intravenous Once     Background 70 y.o. year-old WF with known CKD5 (Dr. Florene Glen), HTN, R PCN tube (changed every 6 weeks in IR and due to be changed this week) R IJ TDC (placed 04/11/15, HD once, and gets weekly flush/heparin exchange in [facility]) w/several prior admissions for fevers/UTI usually around the time of stent change (most recently prior to the current admission 09/2015). She has CKD5 and has not yet started HD On 10/12/15 had a left 2nd stage BVT AVF done (1st stage was done 06/02/15) by Dr. Bridgett Larsson. Was brought to [facility] ED 5/12 with fever (at home to 100.9), and confusion. She is due for tube change this week. Of note is that she had the [facility] flushed on Thursday, 1 day prior to onset of fever and confusion.  At the time of admission to [facility], creatinine was 8.06 (6.43 early April) and at the time of Dr. Abel Presto last office appt he made the appt that she  could "start dialysis at any time" but they have been trying to wait out maturation of her AVF. Cultures done, ATB's started.  Transferred to [facility] in the event that HD needed and we were asked to see. At time of consultation, afebrile and confusion  cleared. Denied edema, anorexia, nausea, vomiting or other uremic symptoms.    1. CKD 5 - GFR <15 for some time. Creatinine up to 8 at time of admission (most recently has been running in the 6's so not much change in GFR). Today 7.69 BUN 73. IVF stopped. No uremic symptoms whatsoever. No emerging need for dialysis. Pt would really like to be able to postpone until AVF matured but has TDC in place if we do need to start. 2. Febrile illness - this time PRECEDING PCN change. Afebrile since admission on cefipime/vanco. Blood cultures neg, urine + pseudomonas >100,000. S/p 1 dose vanco and on cefipime. Marked improvement leukocytosis. 3. Confusion - MS has cleared. Related to fever/infection.  4. Anemia - on Aranesp 200 once a month at [facility]. Last dosed 5/11. TSat at that time 23, Hb 10. Current Hb 9.4 5. Chronic obstructive uropathy - due for R PCN change this week. Will try to get done while here.  6. Metabolic acidosis - mild. Continue oral sodium bicarbonate and bump dose to 2 TID. 7. R PCN - due for PCN exchange this week (every 6 weeks) - usually done at [facility] - will get done here while hospitalized.   Jamal Maes, MD [facility] [phone] pager 11/01/2015, 6:26 AM

## 2015-11-02 LAB — RENAL FUNCTION PANEL
Albumin: 2.4 g/dL — ABNORMAL LOW (ref 3.5–5.0)
Anion gap: 13 (ref 5–15)
BUN: 72 mg/dL — ABNORMAL HIGH (ref 6–20)
CO2: 20 mmol/L — ABNORMAL LOW (ref 22–32)
Calcium: 9 mg/dL (ref 8.9–10.3)
Chloride: 106 mmol/L (ref 101–111)
Creatinine, Ser: 7.87 mg/dL — ABNORMAL HIGH (ref 0.44–1.00)
GFR calc Af Amer: 5 mL/min — ABNORMAL LOW (ref >60)
GFR calc non Af Amer: 5 mL/min — ABNORMAL LOW (ref >60)
Glucose, Bld: 95 mg/dL (ref 65–99)
Phosphorus: 5 mg/dL — ABNORMAL HIGH (ref 2.5–4.6)
Potassium: 4.8 mmol/L (ref 3.5–5.1)
Sodium: 139 mmol/L (ref 135–145)

## 2015-11-02 LAB — CBC
HCT: 30 % — ABNORMAL LOW (ref 36.0–46.0)
Hemoglobin: 9.2 g/dL — ABNORMAL LOW (ref 12.0–15.0)
MCH: 26.7 pg (ref 26.0–34.0)
MCHC: 30.7 g/dL (ref 30.0–36.0)
MCV: 87 fL (ref 78.0–100.0)
Platelets: 355 10*3/uL (ref 150–400)
RBC: 3.45 MIL/uL — ABNORMAL LOW (ref 3.87–5.11)
RDW: 17.6 % — ABNORMAL HIGH (ref 11.5–15.5)
WBC: 11.2 10*3/uL — ABNORMAL HIGH (ref 4.0–10.5)

## 2015-11-02 LAB — TROPONIN I: Troponin I: <0.03 ng/mL (ref <0.031)

## 2015-11-02 LAB — HEPATITIS B SURFACE ANTIGEN: Hepatitis B Surface Ag: NEGATIVE

## 2015-11-02 LAB — VANCOMYCIN, RANDOM: Vancomycin Rm: 22 ug/mL

## 2015-11-02 MED ORDER — ASPIRIN 325 MG PO TABS
325.0000 mg | ORAL_TABLET | Freq: Once | ORAL | Status: AC
  Administered 2015-11-02: 325 mg via ORAL
  Filled 2015-11-02 (×2): qty 1

## 2015-11-02 MED ORDER — MORPHINE SULFATE (PF) 2 MG/ML IV SOLN
1.0000 mg | INTRAVENOUS | Status: DC | PRN
  Administered 2015-11-02 – 2015-11-03 (×3): 1 mg via INTRAVENOUS
  Filled 2015-11-02 (×3): qty 1

## 2015-11-02 MED ORDER — NITROGLYCERIN 0.4 MG SL SUBL
SUBLINGUAL_TABLET | SUBLINGUAL | Status: AC
  Administered 2015-11-02: 0.4 mg
  Filled 2015-11-02: qty 1

## 2015-11-02 MED ORDER — FAMOTIDINE 20 MG PO TABS
20.0000 mg | ORAL_TABLET | Freq: Every day | ORAL | Status: DC
  Administered 2015-11-02 – 2015-11-11 (×10): 20 mg via ORAL
  Filled 2015-11-02 (×10): qty 1

## 2015-11-02 MED ORDER — ALUM & MAG HYDROXIDE-SIMETH 200-200-20 MG/5ML PO SUSP
30.0000 mL | Freq: Four times a day (QID) | ORAL | Status: DC | PRN
  Administered 2015-11-02 – 2015-11-03 (×4): 30 mL via ORAL
  Filled 2015-11-02 (×4): qty 30

## 2015-11-02 MED ORDER — NITROGLYCERIN 0.4 MG SL SUBL
0.4000 mg | SUBLINGUAL_TABLET | SUBLINGUAL | Status: DC | PRN
  Administered 2015-11-02 – 2015-11-03 (×4): 0.4 mg via SUBLINGUAL
  Filled 2015-11-02: qty 1

## 2015-11-02 MED ORDER — DARBEPOETIN ALFA 60 MCG/0.3ML IJ SOSY
60.0000 ug | PREFILLED_SYRINGE | INTRAMUSCULAR | Status: DC
  Filled 2015-11-02: qty 0.3

## 2015-11-02 NOTE — Progress Notes (Signed)
Report given to 3S RN.  Transported patient to 3S10.  Family member accompanied patient during transfer.  Jillyn Ledger, MBA, BSN, RN

## 2015-11-02 NOTE — Progress Notes (Signed)
[facility] Rounding Note  Subjective/Interval Events:  Feels ok, not swollen, ok appetite Pt spoke with Dr. Florene Glen on phone -- he has advised RRT start and I agree; pt accepts Urine culture + pseudomonas, Sn to cefepime  Objective Vital signs in last 24 hours: Filed Vitals:   11/01/15 0510 11/01/15 0900 11/01/15 2255 11/02/15 0520  BP: 137/61 125/54 118/58 113/50  Pulse: 73 85 71 78  Temp: 97.7 F (36.5 C) 97.6 F (36.4 C) 98.7 F (37.1 C) 97.6 F (36.4 C)  TempSrc: Oral Oral Oral Oral  Resp: 18 18 16 18   Height:      Weight:   80.74 kg (178 lb)   SpO2: 100% 97% 100% 98%   Weight change: -2.586 kg (-5 lb 11.2 oz)  Intake/Output Summary (Last 24 hours) at 11/02/15 1143 Last data filed at 11/02/15 0906  Gross per 24 hour  Intake    632 ml  Output   1260 ml  Net   -628 ml   Physical Exam:  BP 113/50 mmHg  Pulse 78  Temp(Src) 97.6 F (36.4 C) (Oral)  Resp 18  Ht 5\' 3"  (1.6 m)  Wt 80.74 kg (178 lb)  BMI 31.54 kg/m2  SpO2 98%  LMP 01/19/1992 (Approximate) Gen: Very nice older WF NAD Neck: no JVD, no bruits Chest: Clear Heart: Regular rhythm S1S2 normal No s3 or S4 No murmur Right TDC IJ (03/2015)  Abdomen: soft, no focal abd tenderness Right perc neph tube with dry dressing in place. Cloudy urine in bag and tubing Ext: No edema of LE's Left upper arm AVF with healing incisions. No drainage or wound dehiscence Neuro: alert, Ox3, no focal deficit   Labs:   Recent Labs Lab 10/29/15 1000 10/31/15 0009 10/31/15 1239 11/01/15 0608 11/02/15 0534  NA 138 137 138 138 139  K 5.0 5.2* 5.1 5.0 4.8  CL 105 104 108 108 106  CO2 19* 19* 17* 16* 20*  GLUCOSE 118* 100* 96 86 95  BUN 89* 82* 80* 73* 72*  CREATININE 8.35* 8.06* 7.39* 7.69* 7.87*  CALCIUM 9.2 9.4 8.6* 9.0 9.0  PHOS 6.0*  --   --  5.3* 5.0*     Recent Labs Lab 10/29/15 1000 11/01/15 0608 11/02/15 0534  ALBUMIN 2.4* 2.4* 2.4*     Recent Labs Lab 10/29/15 1012 10/31/15 0009 11/01/15 0608  11/02/15 0534  WBC  --  20.7* 8.7 11.2*  NEUTROABS  --  18.3*  --   --   HGB 10.0* 9.7* 9.4* 9.2*  HCT  --  30.2* 30.6* 30.0*  MCV  --  83.9 87.9 87.0  PLT  --  385 344 355     Recent Labs Lab 11/01/15 0227  GLUCAP 73    Iron Studies:   Recent Labs Lab 10/29/15 1000  IRON 41  TIBC 175*  FERRITIN 312*   Studies/Results: No results found. Medications:   . ceFEPime (MAXIPIME) IV  1 g Intravenous Q24H  . famotidine  20 mg Oral Daily  . feeding supplement (NEPRO CARB STEADY)  237 mL Oral TID BM  . heparin  5,000 Units Subcutaneous Q8H  . latanoprost  1 drop Both Eyes QHS  . metoprolol tartrate  12.5 mg Oral BID  . saccharomyces boulardii  250 mg Oral BID  . sodium bicarbonate  1,300 mg Oral TID  . timolol  1 drop Both Eyes q morning - 10a  . triamcinolone  1 application Topical BID     Background 70 y.o. year-old  WF with known CKD5 (Dr. Florene Glen), HTN, R PCN tube (changed every 6 weeks in IR and due to be changed this week) R IJ TDC (placed 04/11/15, HD once, and gets weekly flush/heparin exchange in [facility]) w/several prior admissions for fevers/UTI usually around the time of stent change (most recently prior to the current admission 09/2015). She has CKD5 and has not yet started HD On 10/12/15 had a left 2nd stage BVT AVF done (1st stage was done 06/02/15) by Dr. Bridgett Larsson. Was brought to [facility] ED 5/12 with fever (at home to 100.9), and confusion. She is due for tube change this week. Of note is that she had the [facility] flushed on Thursday, 1 day prior to onset of fever and confusion.  At the time of admission to [facility], creatinine was 8.06 (6.43 early April) and at the time of Dr. Abel Presto last office appt he made the appt that she could "start dialysis at any time" but they have been trying to wait out maturation of her AVF. Cultures done, ATB's started.  Transferred to [facility] in the event that HD needed and we were asked to see. At time of consultation, afebrile and confusion  cleared. Denied  edema, anorexia, nausea, vomiting or other uremic symptoms.    1. CKD 5 - GFR <15 for some time.  1. After discussing with Dr. Florene Glen will begin RRT 2. HD#1 today, No UF, 2H, Tight heparin 3. HD#2 Tomorrow 5/16 4. Using TDC 5. Maturing AVF 2. Febrile illness likely 2/2 pseudomonal UTI, Sn to cefepime - this time PRECEDING PCN change. Afebrile since admission and WBC down on cefipime/vanco. On Vanc also for chronic TDC prescence 3. Confusion - MS has cleared. Related to fever/infection.  4. Anemia - Last Aranesp 200 5/11, dose weekly at 60 weekly, no FE with infection 5. Chronic obstructive uropathy - due for R PCN change this week. Will try to get done while here. Now that starting HD will try to remove PCN as long term plan 6. Metabolic acidosis - mild. Can stop NaHCO3 7. R PCN - due for PCN exchange this week (every 6 weeks) - usually done at [facility] - will get done here while hospitalized.   [place] [facility] [phone] pager 11/02/2015, 11:43 AM

## 2015-11-02 NOTE — Clinical Documentation Improvement (Signed)
Hospitalist  Would you please further clarify the diagnosis of encephalopathy?   Metabolic  Toxic  Hypertensive  Other  Clinically Undetermined    Please exercise your independent, professional judgment when responding. A specific answer is not anticipated or expected.   Thank You, [place] [phone]

## 2015-11-02 NOTE — Progress Notes (Signed)
Patient with C/O chest pian after HD, EKG with no acute ST changes, will cycle troponins, on nitro SL prn, morphine prn, will give one dose aspirin and transfer to stepdown Phillips Climes MD

## 2015-11-02 NOTE — Progress Notes (Signed)
PROGRESS NOTE    [name]  [id number] DOB: 1945/09/19 DOA: 10/30/2015 PCP: [name], MD  Outpatient Specialists: Dr. Florene Glen    Brief Narrative:  70 y.o. female with medical history significant of CKD stage V, nephrolithiasis, HTN, CAD, anemia of chronic disease, s/p right nephrostomy tube; who presents with complaints of 1 day history of confusion and fever. Patient noted to have presenting WBC of 20k with UA suggestive of UTI. Patient was started on IVF and antibiotics. Of note, patient is also followed by Dr. Florene Glen for stage 5 CKD with fistula placed in the past few weeks, already has tunneled dialysis catheter, Presenting Cr noted to be 8.06 with BUN of 82, no significant improvement with IV fluids, seen by renal, patient to start hemodialysis on 5/15   Assessment & Plan:   Principal Problem:   Sepsis secondary to UTI [facility]) Active Problems:   Glaucoma   Essential hypertension, benign   Anemia of renal disease   CKD (chronic kidney disease) stage 5, GFR less than 15 ml/min (HCC)   Hyperkalemia  Sepsis secondary to UTI:  - Sepsis criteria with 100.59F temp , WBC of 20.7, and positive urinalysis. Previous history of Pseudomonas infection noted in the past. - Empiric vancomycin and cefepime started on admission, will stop IV vancomycin giving negative lobe cultures - Urine culture growing Pseudomonas aeruginosa, sensitive to cefepime.  Chronic kidney disease stage V:  - Presenting Creatinine elevated at 8.06 with a BUN of 82, he outpatient creatinine around 6 - Renal consult greatly appreciated, management per renal, Patient has left arm AVF, and  and tunneled dialysis catheter if dialysis is needed to be started. - Patient to start chemotherapy doses today. Renal  Chronic obstructive uropathy - Patient with right nephrostomy tube, supposed to be changed this week, renal consulted IR  Acute encephalopathy  - In the setting of infectious process,  resolved,  Essential hypertension - Continued on metoprolol  Glaucoma  - continue home eyedrops   GERD - Cont H2 blocker  Anemia of chronic renal disease - Aranesp per renal   DVT prophylaxis: Heparin subQ Code Status: Full Family Communication: None at bedside Disposition Plan: Pending further workup, dialysis and she did during hospital stay   Consultants:   Nephrology  Procedures:     Antimicrobials: Anti-infectives    Start     Dose/Rate Route Frequency Ordered Stop   11/01/15 1200  vancomycin (VANCOCIN) 500 mg in sodium chloride 0.9 % 100 mL IVPB     500 mg 100 mL/hr over 60 Minutes Intravenous  Once 11/01/15 1131 11/01/15 1637   11/01/15 0600  ceFEPIme (MAXIPIME) 1 g in dextrose 5 % 50 mL IVPB     1 g 100 mL/hr over 30 Minutes Intravenous Every 24 hours 10/31/15 0441     10/31/15 0315  vancomycin (VANCOCIN) IVPB 1000 mg/200 mL premix     1,000 mg 200 mL/hr over 60 Minutes Intravenous  Once 10/31/15 0301 10/31/15 0416   10/31/15 0315  ceFEPIme (MAXIPIME) 1 g in dextrose 5 % 50 mL IVPB     1 g 100 mL/hr over 30 Minutes Intravenous  Once 10/31/15 0304 10/31/15 0610          Subjective: No complaints this AM. Denies sob  Objective: Filed Vitals:   11/01/15 0900 11/01/15 2255 11/02/15 0520 11/02/15 1239  BP: 125/54 118/58 113/50 144/59  Pulse: 85 71 78 81  Temp: 97.6 F (36.4 C) 98.7 F (37.1 C) 97.6 F (36.4 C) 97.7  F (36.5 C)  TempSrc: Oral Oral Oral Oral  Resp: 18 16 18 16   Height:      Weight:  80.74 kg (178 lb)    SpO2: 97% 100% 98% 98%    Intake/Output Summary (Last 24 hours) at 11/02/15 1306 Last data filed at 11/02/15 1235  Gross per 24 hour  Intake    632 ml  Output   1610 ml  Net   -978 ml   Filed Weights   10/31/15 2131 11/01/15 2255  Weight: 83.326 kg (183 lb 11.2 oz) 80.74 kg (178 lb)    Examination:  General exam: Appears calm and comfortable, laying in bed Respiratory system: Clear to auscultation. Respiratory  effort normal. Cardiovascular system: S1 & S2 heard, RRR. No pedal edema. Gastrointestinal system: Abdomen is nondistended, soft and nontender. No organomegaly or masses felt. Normal bowel sounds heard, right nephrostomy tube. Central nervous system: Alert and oriented. No focal neurological deficits. Extremities: Symmetric 5 x 5 power. Skin: No rashes, lesions Psychiatry: Judgement and insight appear normal. Mood & affect appropriate.     Data Reviewed: I have personally reviewed following labs and imaging studies  CBC:  Recent Labs Lab 10/29/15 1012 10/31/15 0009 11/01/15 0608 11/02/15 0534  WBC  --  20.7* 8.7 11.2*  NEUTROABS  --  18.3*  --   --   HGB 10.0* 9.7* 9.4* 9.2*  HCT  --  30.2* 30.6* 30.0*  MCV  --  83.9 87.9 87.0  PLT  --  385 344 [id number]   Basic Metabolic Panel:  Recent Labs Lab 10/29/15 1000 10/31/15 0009 10/31/15 1239 11/01/15 0608 11/02/15 0534  NA 138 137 138 138 139  K 5.0 5.2* 5.1 5.0 4.8  CL 105 104 108 108 106  CO2 19* 19* 17* 16* 20*  GLUCOSE 118* 100* 96 86 95  BUN 89* 82* 80* 73* 72*  CREATININE 8.35* 8.06* 7.39* 7.69* 7.87*  CALCIUM 9.2 9.4 8.6* 9.0 9.0  PHOS 6.0*  --   --  5.3* 5.0*   GFR: Estimated Creatinine Clearance: 6.7 mL/min (by C-G formula based on Cr of 7.87). Liver Function Tests:  Recent Labs Lab 10/29/15 1000 11/01/15 0608 11/02/15 0534  ALBUMIN 2.4* 2.4* 2.4*   No results for input(s): LIPASE, AMYLASE in the last 168 hours. No results for input(s): AMMONIA in the last 168 hours. Coagulation Profile: No results for input(s): INR, PROTIME in the last 168 hours. Cardiac Enzymes: No results for input(s): CKTOTAL, CKMB, CKMBINDEX, TROPONINI in the last 168 hours. BNP (last 3 results) No results for input(s): PROBNP in the last 8760 hours. HbA1C: No results for input(s): HGBA1C in the last 72 hours. CBG:  Recent Labs Lab 11/01/15 0227  GLUCAP 73   Lipid Profile: No results for input(s): CHOL, HDL, LDLCALC, TRIG,  CHOLHDL, LDLDIRECT in the last 72 hours. Thyroid Function Tests: No results for input(s): TSH, T4TOTAL, FREET4, T3FREE, THYROIDAB in the last 72 hours. Anemia Panel: No results for input(s): VITAMINB12, FOLATE, FERRITIN, TIBC, IRON, RETICCTPCT in the last 72 hours. Urine analysis:    Component Value Date/Time   COLORURINE YELLOW 10/31/2015 0034   APPEARANCEUR TURBID* 10/31/2015 0034   LABSPEC 1.011 10/31/2015 0034   PHURINE 6.0 10/31/2015 0034   GLUCOSEU NEGATIVE 10/31/2015 0034   HGBUR LARGE* 10/31/2015 0034   BILIRUBINUR NEGATIVE 10/31/2015 0034   BILIRUBINUR neg 09/18/2014 0920   KETONESUR NEGATIVE 10/31/2015 0034   PROTEINUR 100* 10/31/2015 0034   PROTEINUR 2+ 09/18/2014 0920   UROBILINOGEN 0.2 05/02/2015  2020   UROBILINOGEN negative 09/18/2014 0920   NITRITE NEGATIVE 10/31/2015 0034   NITRITE neg 09/18/2014 0920   LEUKOCYTESUR LARGE* 10/31/2015 0034   Sepsis Labs:  Recent Labs Lab 10/31/15 0021  LATICACIDVEN 0.97    Recent Results (from the past 240 hour(s))  Culture, blood (routine x 2)     Status: None (Preliminary result)   Collection Time: 10/31/15 12:10 AM  Result Value Ref Range Status   Specimen Description BLOOD RIGHT FOREARM  Final   Special Requests BOTTLES DRAWN AEROBIC AND ANAEROBIC 5CC  Final   Culture   Final    NO GROWTH 1 DAY Performed at [facility]    Report Status PENDING  Incomplete  Urine culture     Status: Abnormal (Preliminary result)   Collection Time: 10/31/15 12:34 AM  Result Value Ref Range Status   Specimen Description URINE, RANDOM  Final   Special Requests NONE  Final   Culture >=100,000 COLONIES/mL PSEUDOMONAS AERUGINOSA (A)  Final   Report Status PENDING  Incomplete   Organism ID, Bacteria PSEUDOMONAS AERUGINOSA (A)  Final      Susceptibility   Pseudomonas aeruginosa - MIC*    CEFTAZIDIME 4 SENSITIVE Sensitive     CIPROFLOXACIN 0.5 INTERMEDIATE Intermediate     GENTAMICIN 2 SENSITIVE Sensitive     IMIPENEM 1  SENSITIVE Sensitive     PIP/TAZO 16 SENSITIVE Sensitive     CEFEPIME Value in next row Sensitive      2 SENSITIVEPerformed at [facility]    * >=100,000 COLONIES/mL PSEUDOMONAS AERUGINOSA  Culture, blood (routine x 2)     Status: None (Preliminary result)   Collection Time: 10/31/15 12:56 AM  Result Value Ref Range Status   Specimen Description BLOOD RIGHT ARM  Final   Special Requests BOTTLES DRAWN AEROBIC AND ANAEROBIC 10CC  Final   Culture   Final    NO GROWTH 1 DAY Performed at [facility]    Report Status PENDING  Incomplete  MRSA PCR Screening     Status: None   Collection Time: 10/31/15  7:37 AM  Result Value Ref Range Status   MRSA by PCR NEGATIVE NEGATIVE Final    Comment:        The GeneXpert MRSA Assay (FDA approved for NASAL specimens only), is one component of a comprehensive MRSA colonization surveillance program. It is not intended to diagnose MRSA infection nor to guide or monitor treatment for MRSA infections.          Radiology Studies: No results found.      Scheduled Meds: . ceFEPime (MAXIPIME) IV  1 g Intravenous Q24H  . [START ON 11/06/2015] darbepoetin (ARANESP) injection - DIALYSIS  60 mcg Intravenous Q Fri-HD  . famotidine  20 mg Oral Daily  . feeding supplement (NEPRO CARB STEADY)  237 mL Oral TID BM  . heparin  5,000 Units Subcutaneous Q8H  . latanoprost  1 drop Both Eyes QHS  . metoprolol tartrate  12.5 mg Oral BID  . saccharomyces boulardii  250 mg Oral BID  . sodium bicarbonate  1,300 mg Oral TID  . timolol  1 drop Both Eyes q morning - 10a  . triamcinolone  1 application Topical BID   Continuous Infusions:     LOS: 2 days    Phillips Climes, MD [facility] Pager [phone]  If 7PM-7AM, please contact night-coverage [email or web address] Password [email or web address] 11/02/2015, 1:06 PM

## 2015-11-02 NOTE — Clinical Documentation Improvement (Signed)
Hospitalist  Would you please clarify if nephrostomy tube contributed to UTI as has in the past?  UTI  UTI due to nephrostomy Tube  Other  Clinically Undetermined  Supporting Information: Pt and daughter state that UTI happens every 4 - 6 weeks around time of nephrostomy tube change.   Please exercise your independent, professional judgment when responding. A specific answer is not anticipated or expected.   Thank You, [place] [phone]

## 2015-11-02 NOTE — Progress Notes (Signed)
Patient returned from hemodialysis with reports of chest pain.  Patient pain score of 5 out of 10 on return to 6E08.  [facility] admin.  Informed patient of pending transfer to stepdown.  Jillyn Ledger, MBA, BSN, RN

## 2015-11-02 NOTE — Progress Notes (Signed)
Patient completed 2 hours of dialysis with no fluid removal as scheduled. During post assessment patient advised chest pain level was at a 9. Notified Charge RN advised to contact attending, MD Elgergawy. Placed call to floor nurse to let know why there would be a delay in returning patient. Per MD request stat EKG completed, gave nitro after  5 minutes chest pain level at an 8, BP 130/54 gave nitro once again BP 109/51, pain level at a 6 or 7, after 5 minutes patient began to feel nauseous. Reapplied 02 patient states chest pain at a 5. Notified floor nurse unable to obtain troponin lab due to patient being completely off machine when initial request made. Patient was returned to room alert oriented, vitals stable.

## 2015-11-03 ENCOUNTER — Inpatient Hospital Stay (HOSPITAL_COMMUNITY): Payer: Medicare Other

## 2015-11-03 DIAGNOSIS — R079 Chest pain, unspecified: Secondary | ICD-10-CM

## 2015-11-03 LAB — BASIC METABOLIC PANEL
Anion gap: 14 (ref 5–15)
BUN: 48 mg/dL — ABNORMAL HIGH (ref 6–20)
CO2: 24 mmol/L (ref 22–32)
Calcium: 9.1 mg/dL (ref 8.9–10.3)
Chloride: 101 mmol/L (ref 101–111)
Creatinine, Ser: 6.52 mg/dL — ABNORMAL HIGH (ref 0.44–1.00)
GFR calc Af Amer: 7 mL/min — ABNORMAL LOW (ref >60)
GFR calc non Af Amer: 6 mL/min — ABNORMAL LOW (ref >60)
Glucose, Bld: 126 mg/dL — ABNORMAL HIGH (ref 65–99)
Potassium: 4.6 mmol/L (ref 3.5–5.1)
Sodium: 139 mmol/L (ref 135–145)

## 2015-11-03 LAB — URINE CULTURE: Culture: >=100000 — AB

## 2015-11-03 LAB — HEPATITIS B SURFACE ANTIBODY,QUALITATIVE: Hep B S Ab: NONREACTIVE

## 2015-11-03 LAB — CBC
HCT: 30 % — ABNORMAL LOW (ref 36.0–46.0)
Hemoglobin: 9.5 g/dL — ABNORMAL LOW (ref 12.0–15.0)
MCH: 27.6 pg (ref 26.0–34.0)
MCHC: 31.7 g/dL (ref 30.0–36.0)
MCV: 87.2 fL (ref 78.0–100.0)
Platelets: 245 10*3/uL (ref 150–400)
RBC: 3.44 MIL/uL — ABNORMAL LOW (ref 3.87–5.11)
RDW: 17.6 % — ABNORMAL HIGH (ref 11.5–15.5)
WBC: 16.4 10*3/uL — ABNORMAL HIGH (ref 4.0–10.5)

## 2015-11-03 LAB — TROPONIN I
Troponin I: 0.03 ng/mL (ref <0.031)
Troponin I: <0.03 ng/mL (ref <0.031)

## 2015-11-03 LAB — HEPATITIS B CORE ANTIBODY, TOTAL: Hep B Core Total Ab: NEGATIVE

## 2015-11-03 MED ORDER — LORAZEPAM 2 MG/ML IJ SOLN
0.5000 mg | Freq: Once | INTRAMUSCULAR | Status: AC
  Administered 2015-11-03: 0.5 mg via INTRAVENOUS
  Filled 2015-11-03: qty 1

## 2015-11-03 MED ORDER — MORPHINE SULFATE (PF) 4 MG/ML IV SOLN
4.0000 mg | Freq: Once | INTRAVENOUS | Status: AC
Start: 2015-11-03 — End: 2015-11-03
  Administered 2015-11-03: 4 mg via INTRAVENOUS
  Filled 2015-11-03: qty 1

## 2015-11-03 NOTE — Progress Notes (Signed)
Admit: 10/30/2015 LOS: 3  84F CKD5, chronic R PCN, admit with pseudomonal UTI, new start HD  Subjective:  HD#1 yesterday, some CP during/post not improved with NTG, troponin unchanged normal CP free currently For HD#2 Today   05/15 0701 - 05/16 0700 In: 512 [P.O.:462; IV Piggyback:50] Out: 1125 [id number]  Filed Weights   11/02/15 1717 11/02/15 1950 11/03/15 0259  Weight: 80.2 kg (176 lb 12.9 oz) 81.1 kg (178 lb 12.7 oz) 79.1 kg (174 lb 6.1 oz)    Scheduled Meds: . ceFEPime (MAXIPIME) IV  1 g Intravenous Q24H  . [START ON 11/06/2015] darbepoetin (ARANESP) injection - DIALYSIS  60 mcg Intravenous Q Fri-HD  . famotidine  20 mg Oral Daily  . feeding supplement (NEPRO CARB STEADY)  237 mL Oral TID BM  . heparin  5,000 Units Subcutaneous Q8H  . latanoprost  1 drop Both Eyes QHS  . metoprolol tartrate  12.5 mg Oral BID  . saccharomyces boulardii  250 mg Oral BID  . sodium bicarbonate  1,300 mg Oral TID  . timolol  1 drop Both Eyes q morning - 10a  . triamcinolone  1 application Topical BID   Continuous Infusions:  PRN Meds:.acetaminophen **OR** acetaminophen, albuterol, alum & mag hydroxide-simeth, feeding supplement (NEPRO CARB STEADY), morphine injection, nitroGLYCERIN, nystatin cream, ondansetron **OR** ondansetron (ZOFRAN) IV, sodium chloride, traMADol  Current Labs: reviewed  B Cx 5/13x2: NGTD  Physical Exam:  Blood pressure 114/64, pulse 81, temperature 98.1 F (36.7 C), temperature source Oral, resp. rate 16, height 5\' 3"  (1.6 m), weight 79.1 kg (174 lb 6.1 oz), last menstrual period 01/19/1992, SpO2 95 %. NAD, RRR, no rub CTAB No edema R IJ TDC Maturing BVT +B/T in LUE  A 1. New ESRD 1. Tunneled HD Cath 2. Maturing AVF 3. CLIP started 5/15, likely to [facility] 2. Pseudomonal UTI on cefepime with PCN Present on cefepime 3. Chronic PCN 4. CP episode 5/15 at end of HD and thereafter, troponin negative 5. AMS  P 1. HD #2 today 2. CP w/u per primary; doesn't appear  was cardiac   Pearson Grippe MD 11/03/2015, 12:50 PM   Recent Labs Lab 10/29/15 1000  11/01/15 0608 11/02/15 0534 11/03/15 0543  NA 138  < > 138 139 139  K 5.0  < > 5.0 4.8 4.6  CL 105  < > 108 106 101  CO2 19*  < > 16* 20* 24  GLUCOSE 118*  < > 86 95 126*  BUN 89*  < > 73* 72* 48*  CREATININE 8.35*  < > 7.69* 7.87* 6.52*  CALCIUM 9.2  < > 9.0 9.0 9.1  PHOS 6.0*  --  5.3* 5.0*  --   < > = values in this interval not displayed.  Recent Labs Lab 10/31/15 0009 11/01/15 0608 11/02/15 0534 11/03/15 0543  WBC 20.7* 8.7 11.2* 16.4*  NEUTROABS 18.3*  --   --   --   HGB 9.7* 9.4* 9.2* 9.5*  HCT 30.2* 30.6* 30.0* 30.0*  MCV 83.9 87.9 87.0 87.2  PLT 385 344 355 245

## 2015-11-03 NOTE — Care Management Important Message (Signed)
Important Message  Patient Details  Name: ZYLEAH STRAUGHTER MRN: [id number] Date of Birth: 07/21/1945   Medicare Important Message Given:  Yes    Zenon Mayo, RN 11/03/2015, 12:41 [place] Message  Patient Details  Name: KIMBRELY TORI MRN: [id number] Date of Birth: 1945-07-06   Medicare Important Message Given:  Yes    Zenon Mayo, RN 11/03/2015, 12:41 PM

## 2015-11-03 NOTE — Progress Notes (Signed)
PROGRESS NOTE    [name]  [id number] DOB: Aug 04, 1945 DOA: 10/30/2015 PCP: [name], MD  Outpatient Specialists: Dr. Florene Glen    Brief Narrative:  70 y.o. female with medical history significant of CKD stage V, nephrolithiasis, HTN, CAD, anemia of chronic disease, s/p right nephrostomy tube; who presents with complaints of 1 day history of confusion and fever. Patient noted to have presenting WBC of 20k with UA suggestive of UTI. Patient was started on IVF and antibiotics.  patient is also followed by Dr. Florene Glen as outpatient for stage 5 CKD with fistula placed in the past few weeks, already has tunneled dialysis catheter, Presenting Cr noted to be 8.06 with BUN of 82, no significant improvement with IV fluids, seen by renal, patient to start hemodialysis on 5/15, was transferred to stepdown 5/15 for an episode of chest pain post hemodialysis, with negative workup so far.   Assessment & Plan:   Principal Problem:   Sepsis secondary to UTI [facility]) Active Problems:   Glaucoma   Essential hypertension, benign   Anemia of renal disease   CKD (chronic kidney disease) stage 5, GFR less than 15 ml/min (HCC)   Hyperkalemia  Sepsis secondary to UTI related to nephrostomy tube. - Sepsis criteria with 100.56F temp , WBC of 20.7, and positive urinalysis. Previous history of Pseudomonas infection noted in the past. - Empiric vancomycin and cefepime started on admission, stopped IV vancomycin giving negative lobe cultures - Urine culture growing Pseudomonas aeruginosa, sensitive to cefepime.  Chronic kidney disease stage V:  - Presenting Creatinine elevated at 8.06 with a BUN of 82, he outpatient creatinine around 6 - Renal consult greatly appreciated, management per renal, Patient has left arm AVF, as well tunneled dialysis catheter if dialysis is needed to be started. - Patient started hemodialysis for session on 11/02/2015. - Management per renal  Chronic obstructive  uropathy - Patient with right nephrostomy tube, supposed to be changed this week, renal consulted IR to exchange nephrostomy tube.  Acute encephalopathy  - In the setting of infectious process, resolved,  Essential hypertension - Continued on metoprolol  Glaucoma  - continue home eyedrops   GERD - Cont H2 blocker  Anemia of chronic renal disease - Aranesp per renal  Chest pain - Muscular skeletal feature, troponins negative 3, EKG nonacute, with repeat EKG today, and we will check 2-D echo  Leukocytosis - Patient with a blood cell count of 16.4 K today, she is afebrile, nontoxic appearing, recheck CBC in a.m.   DVT prophylaxis: Heparin subQ Code Status: Full Family Communication: None at bedside Disposition Plan: Pending further plans regarding long-term hemodialysis   Consultants:   Nephrology  Procedures:     Antimicrobials: Anti-infectives    Start     Dose/Rate Route Frequency Ordered Stop   11/01/15 1200  vancomycin (VANCOCIN) 500 mg in sodium chloride 0.9 % 100 mL IVPB     500 mg 100 mL/hr over 60 Minutes Intravenous  Once 11/01/15 1131 11/01/15 1637   11/01/15 0600  ceFEPIme (MAXIPIME) 1 g in dextrose 5 % 50 mL IVPB     1 g 100 mL/hr over 30 Minutes Intravenous Every 24 hours 10/31/15 0441     10/31/15 0315  vancomycin (VANCOCIN) IVPB 1000 mg/200 mL premix     1,000 mg 200 mL/hr over 60 Minutes Intravenous  Once 10/31/15 0301 10/31/15 0416   10/31/15 0315  ceFEPIme (MAXIPIME) 1 g in dextrose 5 % 50 mL IVPB     1 g 100  mL/hr over 30 Minutes Intravenous  Once 10/31/15 0304 10/31/15 0610          Subjective: Complaints of chest pain after hemodialysis yesterday, no recurrence today.  Objective: Filed Vitals:   11/03/15 0207 11/03/15 0209 11/03/15 0259 11/03/15 0716  BP: 97/85  118/57 115/67  Pulse: 87 86 87 86  Temp:   98.2 F (36.8 C) 97.9 F (36.6 C)  TempSrc:   Oral Oral  Resp: 20 19 17 19   Height:      Weight:   79.1 kg (174 lb 6.1  oz)   SpO2: 92% 95% 95% 95%    Intake/Output Summary (Last 24 hours) at 11/03/15 1037 Last data filed at 11/03/15 0900  Gross per 24 hour  Intake    410 ml  Output   1025 ml  Net   -615 ml   Filed Weights   11/02/15 1717 11/02/15 1950 11/03/15 0259  Weight: 80.2 kg (176 lb 12.9 oz) 81.1 kg (178 lb 12.7 oz) 79.1 kg (174 lb 6.1 oz)    Examination:  General exam: Appears calm and comfortable, laying in bed Respiratory system: Clear to auscultation. Respiratory effort normal. Cardiovascular system: S1 & S2 heard, RRR. No pedal edema. Gastrointestinal system: Abdomen is nondistended, soft and nontender.  BS+, right nephrostomy tube, had reproducible chest pain on palpation this a.m. Central nervous system: Alert and oriented. No focal neurological deficits. Extremities: Symmetric 5 x 5 power. Skin: No rashes, lesions Psychiatry: Judgement and insight appear normal. Mood & affect appropriate.     Data Reviewed: I have personally reviewed following labs and imaging studies  CBC:  Recent Labs Lab 10/29/15 1012 10/31/15 0009 11/01/15 0608 11/02/15 0534 11/03/15 0543  WBC  --  20.7* 8.7 11.2* 16.4*  NEUTROABS  --  18.3*  --   --   --   HGB 10.0* 9.7* 9.4* 9.2* 9.5*  HCT  --  30.2* 30.6* 30.0* 30.0*  MCV  --  83.9 87.9 87.0 87.2  PLT  --  385 344 355 [id number]   Basic Metabolic Panel:  Recent Labs Lab 10/29/15 1000 10/31/15 0009 10/31/15 1239 11/01/15 0608 11/02/15 0534 11/03/15 0543  NA 138 137 138 138 139 139  K 5.0 5.2* 5.1 5.0 4.8 4.6  CL 105 104 108 108 106 101  CO2 19* 19* 17* 16* 20* 24  GLUCOSE 118* 100* 96 86 95 126*  BUN 89* 82* 80* 73* 72* 48*  CREATININE 8.35* 8.06* 7.39* 7.69* 7.87* 6.52*  CALCIUM 9.2 9.4 8.6* 9.0 9.0 9.1  PHOS 6.0*  --   --  5.3* 5.0*  --    GFR: Estimated Creatinine Clearance: 8 mL/min (by C-G formula based on Cr of 6.52). Liver Function Tests:  Recent Labs Lab 10/29/15 1000 11/01/15 0608 11/02/15 0534  ALBUMIN 2.4* 2.4* 2.4*     No results for input(s): LIPASE, AMYLASE in the last 168 hours. No results for input(s): AMMONIA in the last 168 hours. Coagulation Profile: No results for input(s): INR, PROTIME in the last 168 hours. Cardiac Enzymes:  Recent Labs Lab 11/02/15 2010 11/03/15 0007 11/03/15 0543  TROPONINI <0.03 <0.03 0.03   BNP (last 3 results) No results for input(s): PROBNP in the last 8760 hours. HbA1C: No results for input(s): HGBA1C in the last 72 hours. CBG:  Recent Labs Lab 11/01/15 0227  GLUCAP 73   Lipid Profile: No results for input(s): CHOL, HDL, LDLCALC, TRIG, CHOLHDL, LDLDIRECT in the last 72 hours. Thyroid Function Tests: No results for  input(s): TSH, T4TOTAL, FREET4, T3FREE, THYROIDAB in the last 72 hours. Anemia Panel: No results for input(s): VITAMINB12, FOLATE, FERRITIN, TIBC, IRON, RETICCTPCT in the last 72 hours. Urine analysis:    Component Value Date/Time   COLORURINE YELLOW 10/31/2015 0034   APPEARANCEUR TURBID* 10/31/2015 0034   LABSPEC 1.011 10/31/2015 0034   PHURINE 6.0 10/31/2015 0034   GLUCOSEU NEGATIVE 10/31/2015 0034   HGBUR LARGE* 10/31/2015 0034   BILIRUBINUR NEGATIVE 10/31/2015 0034   BILIRUBINUR neg 09/18/2014 0920   KETONESUR NEGATIVE 10/31/2015 0034   PROTEINUR 100* 10/31/2015 0034   PROTEINUR 2+ 09/18/2014 0920   UROBILINOGEN 0.2 05/02/2015 2020   UROBILINOGEN negative 09/18/2014 0920   NITRITE NEGATIVE 10/31/2015 0034   NITRITE neg 09/18/2014 0920   LEUKOCYTESUR LARGE* 10/31/2015 0034   Sepsis Labs:  Recent Labs Lab 10/31/15 0021  LATICACIDVEN 0.97    Recent Results (from the past 240 hour(s))  Culture, blood (routine x 2)     Status: None (Preliminary result)   Collection Time: 10/31/15 12:10 AM  Result Value Ref Range Status   Specimen Description BLOOD RIGHT FOREARM  Final   Special Requests BOTTLES DRAWN AEROBIC AND ANAEROBIC 5CC  Final   Culture   Final    NO GROWTH 1 DAY Performed at [facility]    Report Status  PENDING  Incomplete  Urine culture     Status: Abnormal   Collection Time: 10/31/15 12:34 AM  Result Value Ref Range Status   Specimen Description URINE, RANDOM  Final   Special Requests NONE  Final   Culture >=100,000 COLONIES/mL PSEUDOMONAS AERUGINOSA (A)  Final   Report Status 11/03/2015 FINAL  Final   Organism ID, Bacteria PSEUDOMONAS AERUGINOSA (A)  Final      Susceptibility   Pseudomonas aeruginosa - MIC*    CEFTAZIDIME 4 SENSITIVE Sensitive     CIPROFLOXACIN 0.5 INTERMEDIATE Intermediate     GENTAMICIN 2 SENSITIVE Sensitive     IMIPENEM 1 SENSITIVE Sensitive     PIP/TAZO 16 SENSITIVE Sensitive     CEFEPIME 2 SENSITIVE Sensitive     * >=100,000 COLONIES/mL PSEUDOMONAS AERUGINOSA  Culture, blood (routine x 2)     Status: None (Preliminary result)   Collection Time: 10/31/15 12:56 AM  Result Value Ref Range Status   Specimen Description BLOOD RIGHT ARM  Final   Special Requests BOTTLES DRAWN AEROBIC AND ANAEROBIC 10CC  Final   Culture   Final    NO GROWTH 1 DAY Performed at [facility]    Report Status PENDING  Incomplete  MRSA PCR Screening     Status: None   Collection Time: 10/31/15  7:37 AM  Result Value Ref Range Status   MRSA by PCR NEGATIVE NEGATIVE Final    Comment:        The GeneXpert MRSA Assay (FDA approved for NASAL specimens only), is one component of a comprehensive MRSA colonization surveillance program. It is not intended to diagnose MRSA infection nor to guide or monitor treatment for MRSA infections.          Radiology Studies: No results found.      Scheduled Meds: . ceFEPime (MAXIPIME) IV  1 g Intravenous Q24H  . [START ON 11/06/2015] darbepoetin (ARANESP) injection - DIALYSIS  60 mcg Intravenous Q Fri-HD  . famotidine  20 mg Oral Daily  . feeding supplement (NEPRO CARB STEADY)  237 mL Oral TID BM  . heparin  5,000 Units Subcutaneous Q8H  . latanoprost  1 drop Both Eyes  QHS  . metoprolol tartrate  12.5 mg Oral BID  .  saccharomyces boulardii  250 mg Oral BID  . sodium bicarbonate  1,300 mg Oral TID  . timolol  1 drop Both Eyes q morning - 10a  . triamcinolone  1 application Topical BID   Continuous Infusions:     LOS: 3 days    Ailene Royal, MD [facility] Pager [phone]  If 7PM-7AM, please contact night-coverage [email or web address] Password [facility] 11/03/2015, 10:37 AM

## 2015-11-03 NOTE — Progress Notes (Signed)
11/03/2015 4:48 PM  Pt extremely nauseous, actively vomiting.  Not due for another dose of zofran until 1721.  Dr. Waldron Labs paged, orders to give next dose of prn zofran now.  Medication administered to patient.  Will monitor. [name]

## 2015-11-03 NOTE — Progress Notes (Signed)
11/03/2015 3:49 PM  Pt transferred from 3S via wheelchair.  Pt fully alert and oriented, independent.  Complains of 2/10 chest pressure that is constant for her.  Vitals stable.  Full assessment to EPIC.  Skin intact, assessed with Thermon Leyland, RN.  Patient placed on tele box 13 and confirmed with CCMD.  Milton Ferguson, RN second confirm with CCMD.  Pt was oriented to room/unit, and was instructed on how to utilize the call bell, to which she verbalized understanding.  Will continue to monitor. Princella Pellegrini

## 2015-11-03 NOTE — Progress Notes (Signed)
Pt with persistent pain to mid, upper chest, that pt is rating 9/10 and describes as pressure "like someone is stepping on my chest." Pt has received IV morphine and 3 doses of SL nitroglycerin per PRN orders with no relief from pain. NP on call has been paged at this time. Also note vitals below. Pt now dry heaving and complaining of nausea.  Filed Vitals:   11/03/15 0155 11/03/15 0200 11/03/15 0207 11/03/15 0209  BP: 130/62 122/65 97/85   Pulse: 85 90 87 86  Temp:      TempSrc:      Resp: 21 17 20 19   Height:      Weight:      SpO2: 96% 94% 92% 95%

## 2015-11-03 NOTE — Progress Notes (Signed)
  Echocardiogram 2D Echocardiogram has been performed.  [name] 11/03/2015, 1:48 PM

## 2015-11-03 NOTE — Plan of Care (Signed)
Problem: Food- and Nutrition-Related Knowledge Deficit (NB-1.1) Goal: Nutrition education Formal process to instruct or train a patient/client in a skill or to impart knowledge to help patients/clients voluntarily manage or modify food choices and eating behavior to maintain or improve health. Outcome: Completed/Met Date Met:  11/03/15 Nutrition Education Note  RD consulted for Renal Education. Provided Choose-A-Meal Booklet to patient/family. Reviewed food groups and provided written recommended serving sizes specifically determined for patient's current nutritional status.   Explained why diet restrictions are needed and provided lists of foods to limit/avoid that are high potassium, sodium, and phosphorus. Provided specific recommendations on safer alternatives of these foods. Strongly encouraged compliance of this diet.   Discussed importance of protein intake at each meal and snack. Provided examples of how to maximize protein intake throughout the day. Discussed need for fluid restriction with dialysis, importance of minimizing weight gain between HD treatments, and renal-friendly beverage options.  Encouraged pt to discuss specific diet questions/concerns with RD at HD outpatient facility. Teach back method used.  Expect fair compliance.  Body mass index is 30.9 kg/(m^2). Pt meets criteria for obese based on current BMI.  Current diet order is renal, patient is consuming approximately 50-75% of meals at this time. Labs and medications reviewed. No further nutrition interventions warranted at this time. RD contact information provided. If additional nutrition issues arise, please re-consult RD.  Satira Anis. Jacquelyne Quarry, MS, RD LDN Inpatient Clinical Dietitian Pager [phone]

## 2015-11-04 ENCOUNTER — Inpatient Hospital Stay (HOSPITAL_COMMUNITY): Payer: Medicare Other

## 2015-11-04 ENCOUNTER — Encounter (HOSPITAL_COMMUNITY): Payer: Medicare Other

## 2015-11-04 LAB — CBC
HCT: 30.7 % — ABNORMAL LOW (ref 36.0–46.0)
Hemoglobin: 9.4 g/dL — ABNORMAL LOW (ref 12.0–15.0)
MCH: 26.9 pg (ref 26.0–34.0)
MCHC: 30.6 g/dL (ref 30.0–36.0)
MCV: 88 fL (ref 78.0–100.0)
Platelets: 286 10*3/uL (ref 150–400)
RBC: 3.49 MIL/uL — ABNORMAL LOW (ref 3.87–5.11)
RDW: 18.1 % — ABNORMAL HIGH (ref 11.5–15.5)
WBC: 12.6 10*3/uL — ABNORMAL HIGH (ref 4.0–10.5)

## 2015-11-04 LAB — BASIC METABOLIC PANEL
Anion gap: 14 (ref 5–15)
BUN: 53 mg/dL — ABNORMAL HIGH (ref 6–20)
CO2: 24 mmol/L (ref 22–32)
Calcium: 9 mg/dL (ref 8.9–10.3)
Chloride: 100 mmol/L — ABNORMAL LOW (ref 101–111)
Creatinine, Ser: 6.69 mg/dL — ABNORMAL HIGH (ref 0.44–1.00)
GFR calc Af Amer: 6 mL/min — ABNORMAL LOW (ref >60)
GFR calc non Af Amer: 6 mL/min — ABNORMAL LOW (ref >60)
Glucose, Bld: 102 mg/dL — ABNORMAL HIGH (ref 65–99)
Potassium: 4.9 mmol/L (ref 3.5–5.1)
Sodium: 138 mmol/L (ref 135–145)

## 2015-11-04 LAB — ECHOCARDIOGRAM COMPLETE
Height: 63 in
Weight: 2790.14 oz

## 2015-11-04 MED ORDER — LIDOCAINE-PRILOCAINE 2.5-2.5 % EX CREA: 1.0000 "application " | TOPICAL_CREAM | CUTANEOUS | Status: DC | PRN

## 2015-11-04 MED ORDER — LIDOCAINE HCL (PF) 1 % IJ SOLN: 5.0000 mL | INTRAMUSCULAR | Status: DC | PRN

## 2015-11-04 MED ORDER — IOPAMIDOL (ISOVUE-300) INJECTION 61%
INTRAVENOUS | Status: AC
  Administered 2015-11-04: 10 mL
  Filled 2015-11-04: qty 50

## 2015-11-04 MED ORDER — PENTAFLUOROPROP-TETRAFLUOROETH EX AERO: 1.0000 "application " | INHALATION_SPRAY | CUTANEOUS | Status: DC | PRN

## 2015-11-04 MED ORDER — LIDOCAINE HCL 1 % IJ SOLN
INTRAMUSCULAR | Status: AC
  Filled 2015-11-04: qty 20

## 2015-11-04 MED ORDER — HEPARIN SODIUM (PORCINE) 1000 UNIT/ML DIALYSIS: 20.0000 [IU]/kg | INTRAMUSCULAR | Status: DC | PRN

## 2015-11-04 MED ORDER — SODIUM CHLORIDE 0.9 % IV SOLN: 100.0000 mL | INTRAVENOUS | Status: DC | PRN

## 2015-11-04 MED ORDER — ALTEPLASE 2 MG IJ SOLR: 2.0000 mg | Freq: Once | INTRAMUSCULAR | Status: DC | PRN

## 2015-11-04 MED ORDER — HEPARIN SODIUM (PORCINE) 1000 UNIT/ML DIALYSIS: 1000.0000 [IU] | INTRAMUSCULAR | Status: DC | PRN

## 2015-11-04 NOTE — Procedures (Addendum)
I was present at this dialysis session. I have reviewed the session itself and made appropriate changes.   She says to have PCN exchange today?   Tolerating HD #2 w/o problems. On 3K bath.    Looksl ike will be able to start HD 5/20 at [facility] -- will plan for HD 5/18.  Needs PCN exchange and ABX defined.  Can do ceftazidime at HD Unit if desired.     Recent Labs Lab 11/02/15 0534  11/04/15 0515  NA 139  < > 138  K 4.8  < > 4.9  CL 106  < > 100*  CO2 20*  < > 24  GLUCOSE 95  < > 102*  BUN 72*  < > 53*  CREATININE 7.87*  < > 6.69*  CALCIUM 9.0  < > 9.0  PHOS 5.0*  --   --   < > = values in this interval not displayed.   Recent Labs Lab 10/31/15 0009  11/02/15 0534 11/03/15 0543 11/04/15 0515  WBC 20.7*  < > 11.2* 16.4* 12.6*  NEUTROABS 18.3*  --   --   --   --   HGB 9.7*  < > 9.2* 9.5* 9.4*  HCT 30.2*  < > 30.0* 30.0* 30.7*  MCV 83.9  < > 87.0 87.2 88.0  PLT 385  < > 355 245 286  < > = values in this interval not displayed.  Scheduled Meds: . ceFEPime (MAXIPIME) IV  1 g Intravenous Q24H  . [START ON 11/06/2015] darbepoetin (ARANESP) injection - DIALYSIS  60 mcg Intravenous Q Fri-HD  . famotidine  20 mg Oral Daily  . feeding supplement (NEPRO CARB STEADY)  237 mL Oral TID BM  . heparin  5,000 Units Subcutaneous Q8H  . latanoprost  1 drop Both Eyes QHS  . metoprolol tartrate  12.5 mg Oral BID  . saccharomyces boulardii  250 mg Oral BID  . sodium bicarbonate  1,300 mg Oral TID  . timolol  1 drop Both Eyes q morning - 10a  . triamcinolone  1 application Topical BID   Continuous Infusions:  PRN Meds:.acetaminophen **OR** acetaminophen, albuterol, alum & mag hydroxide-simeth, feeding supplement (NEPRO CARB STEADY), morphine injection, nitroGLYCERIN, nystatin cream, ondansetron **OR** ondansetron (ZOFRAN) IV, sodium chloride, traMADol   Pearson Grippe  MD 11/04/2015, 9:12 AM

## 2015-11-04 NOTE — Progress Notes (Signed)
11/04/2015 4:26 PM Patient was instructed to visit her outpatient center on Friday to sign paperwork and consents so she can begin treatment on Saturday. Thank you.

## 2015-11-04 NOTE — Progress Notes (Signed)
PROGRESS NOTE    [name]  [id number] DOB: [birth year] DOA: 10/30/2015 PCP: [name], MD  Outpatient Specialists: Dr. Florene Glen    Brief Narrative:  70 y.o. female with medical history significant of CKD stage V, nephrolithiasis, HTN, CAD, anemia of chronic disease, s/p right nephrostomy tube; who presented with complaints of 1 day history of confusion and fever. Patient noted to have  WBC of 20k with UA suggestive of UTI. Patient was started on IVF and antibiotics. She is followed by Dr. Florene Glen as outpatient for stage 5 CKD with fistula placed in the past few weeks, already has tunneled dialysis catheter. Presenting Cr noted to be 8.06 with BUN of 82, no significant improvement with IV fluids, seen by renal, started hemodialysis on 5/15, was transferred to stepdown 5/15 for an episode of chest pain post hemodialysis, with negative workup so far.   Assessment & Plan:   Principal Problem:   Sepsis secondary to UTI [facility]) Active Problems:   Glaucoma   Essential hypertension, benign   Anemia of renal disease   CKD (chronic kidney disease) stage 5, GFR less than 15 ml/min (HCC)   Hyperkalemia  Sepsis secondary to UTI related to nephrostomy tube. - Sepsis criteria with 100.42F temp , WBC of 20.7, and positive urinalysis. Previous history of Pseudomonas infection noted in the past. - Empiric vancomycin and cefepime started on admission, stopped IV vancomycin giving negative cultures - Urine culture growing Pseudomonas aeruginosa, sensitive to cefepime-if unable to give across dialysis then consider changing to ceftaz edema. Blood cultures 2: Negative to date..  Chronic kidney disease stage V:  - Presenting Creatinine elevated at 8.06 with a BUN of 82, he outpatient creatinine around 6 - Patient has left arm AVF, as well tunneled dialysis catheter. - Patient started hemodialysis on 11/02/2015. - Management per renal  Chronic obstructive uropathy - Patient with right  nephrostomy tube, changed 5/17 by IR.   Acute encephalopathy  - In the setting of infectious process, resolved,  Essential hypertension - Continued on metoprolol  Glaucoma  - continue home eyedrops   GERD - Cont H2 blocker  Anemia of chronic renal disease - Aranesp per renal  Chest pain - Muscular skeletal feature, troponins negative 3, EKG nonacute, with repeat EKG, and 2-D echo: LVEF 55-60 percent, no wall motion abnormalities and grade 1 diastolic dysfunction.  Leukocytosis - improving  Transient A. Fib - Noted on telemetry 5/16 night. As per review of her cardiologist's note on 11/21/14: History of A. fib in the context of acute illness, taken off amiodarone, resolved cardiomyopathy. Continue monitoring on telemetry overnight for recurrence.   DVT prophylaxis: Heparin subQ Code Status: Full Family Communication: None at bedside Disposition Plan: Pending further plans regarding long-term hemodialysis   Consultants:   Nephrology  Procedures:   HD  Antimicrobials: Anti-infectives    Start     Dose/Rate Route Frequency Ordered Stop   11/01/15 1200  vancomycin (VANCOCIN) 500 mg in sodium chloride 0.9 % 100 mL IVPB     500 mg 100 mL/hr over 60 Minutes Intravenous  Once 11/01/15 1131 11/01/15 1637   11/01/15 0600  ceFEPIme (MAXIPIME) 1 g in dextrose 5 % 50 mL IVPB     1 g 100 mL/hr over 30 Minutes Intravenous Every 24 hours 10/31/15 0441     10/31/15 0315  vancomycin (VANCOCIN) IVPB 1000 mg/200 mL premix     1,000 mg 200 mL/hr over 60 Minutes Intravenous  Once 10/31/15 0301 10/31/15 0416   10/31/15 0315  ceFEPIme (MAXIPIME) 1 g in dextrose 5 % 50 mL IVPB     1 g 100 mL/hr over 30 Minutes Intravenous  Once 10/31/15 0304 10/31/15 0610          Subjective: Seen at HD- denies complaints. Denies pain.  Objective: Filed Vitals:   11/04/15 0930 11/04/15 1000 11/04/15 1025 11/04/15 1102  BP: 113/59 118/62 122/56 105/51  Pulse: 94 94 84 98  Temp:   98.2 F  (36.8 C) 97.9 F (36.6 C)  TempSrc:   Oral Oral  Resp: 17  12 16   Height:      Weight:   78.4 kg (172 lb 13.5 oz)   SpO2:   97% 97%    Intake/Output Summary (Last 24 hours) at 11/04/15 1639 Last data filed at 11/04/15 1400  Gross per 24 hour  Intake    360 ml  Output   1600 ml  Net  -1240 ml   Filed Weights   11/03/15 2300 11/04/15 0711 11/04/15 1025  Weight: 80.8 kg (178 lb 2.1 oz) 79.7 kg (175 lb 11.3 oz) 78.4 kg (172 lb 13.5 oz)    Examination:  General exam: Appears calm and comfortable, laying in bed undergoing HD this morning. Respiratory system: Clear to auscultation. Respiratory effort normal. Cardiovascular system: S1 & S2 heard, RRR. No pedal edema. Telemetry: Sinus rhythm. Overnight? A. fib with heart rate in the 120s-130s. Gastrointestinal system: Abdomen is nondistended, soft and nontender.  BS+, right nephrostomy tube, had reproducible chest pain on palpation this a.m. Central nervous system: Alert and oriented. No focal neurological deficits. Extremities: Symmetric 5 x 5 power. Skin: No rashes, lesions Psychiatry: Judgement and insight appear normal. Mood & affect appropriate.     Data Reviewed: I have personally reviewed following labs and imaging studies  CBC:  Recent Labs Lab 10/31/15 0009 11/01/15 0608 11/02/15 0534 11/03/15 0543 11/04/15 0515  WBC 20.7* 8.7 11.2* 16.4* 12.6*  NEUTROABS 18.3*  --   --   --   --   HGB 9.7* 9.4* 9.2* 9.5* 9.4*  HCT 30.2* 30.6* 30.0* 30.0* 30.7*  MCV 83.9 87.9 87.0 87.2 88.0  PLT 385 344 355 245 [id number]   Basic Metabolic Panel:  Recent Labs Lab 10/29/15 1000  10/31/15 1239 11/01/15 0608 11/02/15 0534 11/03/15 0543 11/04/15 0515  NA 138  < > 138 138 139 139 138  K 5.0  < > 5.1 5.0 4.8 4.6 4.9  CL 105  < > 108 108 106 101 100*  CO2 19*  < > 17* 16* 20* 24 24  GLUCOSE 118*  < > 96 86 95 126* 102*  BUN 89*  < > 80* 73* 72* 48* 53*  CREATININE 8.35*  < > 7.39* 7.69* 7.87* 6.52* 6.69*  CALCIUM 9.2  < > 8.6*  9.0 9.0 9.1 9.0  PHOS 6.0*  --   --  5.3* 5.0*  --   --   < > = values in this interval not displayed. GFR: Estimated Creatinine Clearance: 7.8 mL/min (by C-G formula based on Cr of 6.69). Liver Function Tests:  Recent Labs Lab 10/29/15 1000 11/01/15 0608 11/02/15 0534  ALBUMIN 2.4* 2.4* 2.4*   No results for input(s): LIPASE, AMYLASE in the last 168 hours. No results for input(s): AMMONIA in the last 168 hours. Coagulation Profile: No results for input(s): INR, PROTIME in the last 168 hours. Cardiac Enzymes:  Recent Labs Lab 11/02/15 2010 11/03/15 0007 11/03/15 0543  TROPONINI <0.03 <0.03 0.03   BNP (last 3  results) No results for input(s): PROBNP in the last 8760 hours. HbA1C: No results for input(s): HGBA1C in the last 72 hours. CBG:  Recent Labs Lab 11/01/15 0227  GLUCAP 73   Lipid Profile: No results for input(s): CHOL, HDL, LDLCALC, TRIG, CHOLHDL, LDLDIRECT in the last 72 hours. Thyroid Function Tests: No results for input(s): TSH, T4TOTAL, FREET4, T3FREE, THYROIDAB in the last 72 hours. Anemia Panel: No results for input(s): VITAMINB12, FOLATE, FERRITIN, TIBC, IRON, RETICCTPCT in the last 72 hours. Urine analysis:    Component Value Date/Time   COLORURINE YELLOW 10/31/2015 0034   APPEARANCEUR TURBID* 10/31/2015 0034   LABSPEC 1.011 10/31/2015 0034   PHURINE 6.0 10/31/2015 0034   GLUCOSEU NEGATIVE 10/31/2015 0034   HGBUR LARGE* 10/31/2015 0034   BILIRUBINUR NEGATIVE 10/31/2015 0034   BILIRUBINUR neg 09/18/2014 0920   KETONESUR NEGATIVE 10/31/2015 0034   PROTEINUR 100* 10/31/2015 0034   PROTEINUR 2+ 09/18/2014 0920   UROBILINOGEN 0.2 05/02/2015 2020   UROBILINOGEN negative 09/18/2014 0920   NITRITE NEGATIVE 10/31/2015 0034   NITRITE neg 09/18/2014 0920   LEUKOCYTESUR LARGE* 10/31/2015 0034   Sepsis Labs:  Recent Labs Lab 10/31/15 0021  LATICACIDVEN 0.97    Recent Results (from the past 240 hour(s))  Culture, blood (routine x 2)     Status:  None (Preliminary result)   Collection Time: 10/31/15 12:10 AM  Result Value Ref Range Status   Specimen Description BLOOD RIGHT FOREARM  Final   Special Requests BOTTLES DRAWN AEROBIC AND ANAEROBIC 5CC  Final   Culture   Final    NO GROWTH 4 DAYS Performed at [facility]    Report Status PENDING  Incomplete  Urine culture     Status: Abnormal   Collection Time: 10/31/15 12:34 AM  Result Value Ref Range Status   Specimen Description URINE, RANDOM  Final   Special Requests NONE  Final   Culture >=100,000 COLONIES/mL PSEUDOMONAS AERUGINOSA (A)  Final   Report Status 11/03/2015 FINAL  Final   Organism ID, Bacteria PSEUDOMONAS AERUGINOSA (A)  Final      Susceptibility   Pseudomonas aeruginosa - MIC*    CEFTAZIDIME 4 SENSITIVE Sensitive     CIPROFLOXACIN 0.5 INTERMEDIATE Intermediate     GENTAMICIN 2 SENSITIVE Sensitive     IMIPENEM 1 SENSITIVE Sensitive     PIP/TAZO 16 SENSITIVE Sensitive     CEFEPIME 2 SENSITIVE Sensitive     * >=100,000 COLONIES/mL PSEUDOMONAS AERUGINOSA  Culture, blood (routine x 2)     Status: None (Preliminary result)   Collection Time: 10/31/15 12:56 AM  Result Value Ref Range Status   Specimen Description BLOOD RIGHT ARM  Final   Special Requests BOTTLES DRAWN AEROBIC AND ANAEROBIC 10CC  Final   Culture   Final    NO GROWTH 4 DAYS Performed at [facility]    Report Status PENDING  Incomplete  MRSA PCR Screening     Status: None   Collection Time: 10/31/15  7:37 AM  Result Value Ref Range Status   MRSA by PCR NEGATIVE NEGATIVE Final    Comment:        The GeneXpert MRSA Assay (FDA approved for NASAL specimens only), is one component of a comprehensive MRSA colonization surveillance program. It is not intended to diagnose MRSA infection nor to guide or monitor treatment for MRSA infections.          Radiology Studies: Ir Nephrostomy Exchange Right  11/04/2015  CLINICAL DATA:  Obstructive right hydronephrosis, chronic  indwelling right percutaneous nephrostomy catheter and double-J ureteral stent. Routine exchange. EXAM: RIGHT PERCUTANEOUS NEPHROSTOMY CATHETER EXCHANGE UNDER FLUOROSCOPY FLUOROSCOPY TIME:  0.9 minutes, 31  uGym2 DAP TECHNIQUE: The nephrostomy tube and surrounding skin were prepped with Betadine, draped in usual sterile fashion. A small amount of contrast was injected through the right nephrostomy catheter to opacify the renal collecting system. The catheter was cut and exchanged over a 0.035" angiographic wire for a new 10-French DAWSON MUELLER pigtail catheter, formed centrally within the collecting system under fluoroscopy. Contrast injection confirms appropriate positioning. The catheter was secured externally with a Statlock device. The patient tolerated the procedure well. Complications: None. IMPRESSION: 1. Technically successful exchange of right nephrostomy catheter under fluoroscopy Electronically Signed   By: Lucrezia Europe M.D.   On: 11/04/2015 15:11        Scheduled Meds: . ceFEPime (MAXIPIME) IV  1 g Intravenous Q24H  . [START ON 11/06/2015] darbepoetin (ARANESP) injection - DIALYSIS  60 mcg Intravenous Q Fri-HD  . famotidine  20 mg Oral Daily  . feeding supplement (NEPRO CARB STEADY)  237 mL Oral TID BM  . heparin  5,000 Units Subcutaneous Q8H  . latanoprost  1 drop Both Eyes QHS  . lidocaine      . metoprolol tartrate  12.5 mg Oral BID  . saccharomyces boulardii  250 mg Oral BID  . timolol  1 drop Both Eyes q morning - 10a  . triamcinolone  1 application Topical BID   Continuous Infusions:     LOS: 4 days    Antonina Deziel, MD [facility] Pager [phone]  If 7PM-7AM, please contact night-coverage [email or web address] Password [facility] 11/04/2015, 4:39 PM

## 2015-11-04 NOTE — Progress Notes (Signed)
11/04/2015 8:17 AM Hemodialysis Outpatient Note; this patient has been accepted at [facility] located on  [facility] on a Tuesday, Thursday and Saturday 2nd shift schedule. The center would like to begin treatment on Tuesday May 23rd at 11:50 AM. Thank you. Gordy Savers

## 2015-11-05 ENCOUNTER — Other Ambulatory Visit (HOSPITAL_COMMUNITY): Payer: Medicare Other

## 2015-11-05 ENCOUNTER — Encounter (HOSPITAL_COMMUNITY): Payer: Self-pay | Admitting: Physician Assistant

## 2015-11-05 DIAGNOSIS — I480 Paroxysmal atrial fibrillation: Secondary | ICD-10-CM | POA: Diagnosis not present

## 2015-11-05 LAB — RENAL FUNCTION PANEL
Albumin: 2.2 g/dL — ABNORMAL LOW (ref 3.5–5.0)
Anion gap: 14 (ref 5–15)
BUN: 30 mg/dL — ABNORMAL HIGH (ref 6–20)
CO2: 26 mmol/L (ref 22–32)
Calcium: 8.7 mg/dL — ABNORMAL LOW (ref 8.9–10.3)
Chloride: 96 mmol/L — ABNORMAL LOW (ref 101–111)
Creatinine, Ser: 4.88 mg/dL — ABNORMAL HIGH (ref 0.44–1.00)
GFR calc Af Amer: 10 mL/min — ABNORMAL LOW (ref >60)
GFR calc non Af Amer: 8 mL/min — ABNORMAL LOW (ref >60)
Glucose, Bld: 145 mg/dL — ABNORMAL HIGH (ref 65–99)
Phosphorus: 3.1 mg/dL (ref 2.5–4.6)
Potassium: 4.1 mmol/L (ref 3.5–5.1)
Sodium: 136 mmol/L (ref 135–145)

## 2015-11-05 LAB — CBC
HCT: 34.7 % — ABNORMAL LOW (ref 36.0–46.0)
Hemoglobin: 10.6 g/dL — ABNORMAL LOW (ref 12.0–15.0)
MCH: 26.7 pg (ref 26.0–34.0)
MCHC: 30.5 g/dL (ref 30.0–36.0)
MCV: 87.4 fL (ref 78.0–100.0)
Platelets: 295 10*3/uL (ref 150–400)
RBC: 3.97 MIL/uL (ref 3.87–5.11)
RDW: 17.8 % — ABNORMAL HIGH (ref 11.5–15.5)
WBC: 13.5 10*3/uL — ABNORMAL HIGH (ref 4.0–10.5)

## 2015-11-05 LAB — CULTURE, BLOOD (ROUTINE X 2)
Culture: NO GROWTH
Culture: NO GROWTH

## 2015-11-05 LAB — TSH: TSH: 1.242 u[IU]/mL (ref 0.350–4.500)

## 2015-11-05 MED ORDER — DEXTROSE 5 % IV SOLN
2.0000 g | INTRAVENOUS | Status: DC
  Administered 2015-11-05 – 2015-11-10 (×3): 2 g via INTRAVENOUS
  Filled 2015-11-05 (×6): qty 2

## 2015-11-05 MED ORDER — METOPROLOL TARTRATE 25 MG PO TABS
25.0000 mg | ORAL_TABLET | Freq: Two times a day (BID) | ORAL | Status: DC
  Administered 2015-11-05 – 2015-11-06 (×2): 25 mg via ORAL
  Filled 2015-11-05 (×3): qty 1

## 2015-11-05 MED ORDER — AMIODARONE HCL IN DEXTROSE 360-4.14 MG/200ML-% IV SOLN
60.0000 mg/h | INTRAVENOUS | Status: DC
  Administered 2015-11-05: 60 mg/h via INTRAVENOUS
  Filled 2015-11-05 (×2): qty 200

## 2015-11-05 MED ORDER — AMIODARONE LOAD VIA INFUSION
150.0000 mg | Freq: Once | INTRAVENOUS | Status: AC
  Administered 2015-11-05: 150 mg via INTRAVENOUS
  Filled 2015-11-05: qty 83.34

## 2015-11-05 MED ORDER — METOPROLOL TARTRATE 5 MG/5ML IV SOLN
2.5000 mg | INTRAVENOUS | Status: DC | PRN
  Administered 2015-11-05: 2.5 mg via INTRAVENOUS
  Filled 2015-11-05: qty 5

## 2015-11-05 MED ORDER — AMIODARONE HCL IN DEXTROSE 360-4.14 MG/200ML-% IV SOLN
30.0000 mg/h | INTRAVENOUS | Status: DC
  Administered 2015-11-05 – 2015-11-07 (×3): 30 mg/h via INTRAVENOUS
  Filled 2015-11-05 (×3): qty 200

## 2015-11-05 NOTE — Progress Notes (Signed)
Amiodarone Drug - Drug Interaction Consult Note  Recommendations: Monitor hemodynamics and electrolytes while on amiodarone.   Amiodarone is metabolized by the cytochrome P450 system and therefore has the potential to cause many drug interactions. Amiodarone has an average plasma half-life of 50 days (range 20 to 100 days).   There is potential for drug interactions to occur several weeks or months after stopping treatment and the onset of drug interactions may be slow after initiating amiodarone.   []  Statins: Increased risk of myopathy. Simvastatin- restrict dose to 20mg  daily. Other statins: counsel patients to report any muscle pain or weakness immediately.  []  Anticoagulants: Amiodarone can increase anticoagulant effect. Consider warfarin dose reduction. Patients should be monitored closely and the dose of anticoagulant altered accordingly, remembering that amiodarone levels take several weeks to stabilize.  []  Antiepileptics: Amiodarone can increase plasma concentration of phenytoin, the dose should be reduced. Note that small changes in phenytoin dose can result in large changes in levels. Monitor patient and counsel on signs of toxicity.  [x]  Beta blockers: increased risk of bradycardia, AV block and myocardial depression. Sotalol - avoid concomitant use.  []   Calcium channel blockers (diltiazem and verapamil): increased risk of bradycardia, AV block and myocardial depression.  []   Cyclosporine: Amiodarone increases levels of cyclosporine. Reduced dose of cyclosporine is recommended.  []  Digoxin dose should be halved when amiodarone is started.  []  Diuretics: increased risk of cardiotoxicity if hypokalemia occurs.  []  Oral hypoglycemic agents (glyburide, glipizide, glimepiride): increased risk of hypoglycemia. Patient's glucose levels should be monitored closely when initiating amiodarone therapy.   []  Drugs that prolong the QT interval:  Torsades de pointes risk may be increased  with concurrent use - avoid if possible.  Monitor QTc, also keep magnesium/potassium WNL if concurrent therapy can't be avoided. Marland Kitchen Antibiotics: e.g. fluoroquinolones, erythromycin. . Antiarrhythmics: e.g. quinidine, procainamide, disopyramide, sotalol. . Antipsychotics: e.g. phenothiazines, haloperidol.  . Lithium, tricyclic antidepressants, and methadone. Thank You,  Sergei Delo, Rande Lawman  11/05/2015 5:10 PM

## 2015-11-05 NOTE — Procedures (Signed)
I was present at this dialysis session. I have reviewed the session itself and made appropriate changes.   Tolerating Tx.  PCN exchanged yesterday.  Will treat with ceftazidime for 14d for PsA UTI.  WIll need urology eval durign that period for ? If hardware can be removed.    OK For discharge today.  Next HD 5/20 at [facility].    Recent Labs Lab 11/05/15 0718  NA 136  K 4.1  CL 96*  CO2 26  GLUCOSE 145*  BUN 30*  CREATININE 4.88*  CALCIUM 8.7*  PHOS 3.1     Recent Labs Lab 10/31/15 0009  11/03/15 0543 11/04/15 0515 11/05/15 0530  WBC 20.7*  < > 16.4* 12.6* 13.5*  NEUTROABS 18.3*  --   --   --   --   HGB 9.7*  < > 9.5* 9.4* 10.6*  HCT 30.2*  < > 30.0* 30.7* 34.7*  MCV 83.9  < > 87.2 88.0 87.4  PLT 385  < > 245 286 295  < > = values in this interval not displayed.  Scheduled Meds: . ceFEPime (MAXIPIME) IV  1 g Intravenous Q24H  . [START ON 11/06/2015] darbepoetin (ARANESP) injection - DIALYSIS  60 mcg Intravenous Q Fri-HD  . famotidine  20 mg Oral Daily  . feeding supplement (NEPRO CARB STEADY)  237 mL Oral TID BM  . heparin  5,000 Units Subcutaneous Q8H  . latanoprost  1 drop Both Eyes QHS  . metoprolol tartrate  12.5 mg Oral BID  . saccharomyces boulardii  250 mg Oral BID  . timolol  1 drop Both Eyes q morning - 10a  . triamcinolone  1 application Topical BID   Continuous Infusions:  PRN Meds:.acetaminophen **OR** acetaminophen, albuterol, alum & mag hydroxide-simeth, feeding supplement (NEPRO CARB STEADY), morphine injection, nitroGLYCERIN, nystatin cream, ondansetron **OR** ondansetron (ZOFRAN) IV, sodium chloride, traMADol   Pearson Grippe  MD 11/05/2015, 8:48 AM

## 2015-11-05 NOTE — Consult Note (Signed)
CARDIOLOGY CONSULT NOTE   Patient ID: [name] MRN: [id number] DOB/AGE: 11-17-1945 70 y.o.  Admit date: 10/30/2015  Requesting Physician: Dr. Algis Liming Primary Physician:   [name], MD Primary Cardiologist:  Dr. Irish Lack  Reason for Consultation:   afib with RVR  HPI: [name] is a 70 y.o. female with a history of CKD stage V, nephrolithiasis s/p right nephrostomy tube, HTN, and previous LV dysfunction and PAF in the setting of sepsis who presented to [facility] ED on 10/30/15 with confusion and fever. She was found to have sepsis 2/2 UTI and nephrostomy tube. She went into afib with RVR today and cardiology was consulted.   He was last seen by Dr. Irish Lack in 07/2013 for regular cardiac follow up. She had a history of LV dysfunction (EF 20-25%) related to sepsis in 2013. She had a Type II MI at the time. She also had transient AFib as well. EF was severely decreased but returned to normal just 1 week later by 2D ECHO. In 06/2012 she had an abnormal nuclear stress test followed by a normal cardiac cath with no CAD. She was taken off amiodarone. She has not been seen by cardiology since that time.   She is followed by Dr. Florene Glen as outpatient for stage 5 CKD. She recently (10/12/15) had a left 2nd stage BVT AV fistula done (1st stage was done 06/02/15) by Dr. Bridgett Larsson.Dr. Abel Presto last office appt he made the appt that she could "start dialysis at any time" but they have been trying to wait out maturation of her AVF  She has acute on chronic kidney disease partly from obstructive uropathy. She finally had a right nephrostomy tube placed in 03/2015  (changed every 6 weeks in IR). She has had several admissions for fevers/UTI usually around the time of stent change (most recently prior to the current admission 09/2015).  She presented again on 10/30/15 with confusion and fever. Patient noted to [facility] of 20k with UA suggestive of UTI. Patient was started on IVF and antibiotics.  Presenting Cr noted to be 8.06 with BUN of 82, no significant improvement with IV fluids, seen by renal, started hemodialysis on 5/15. 2D ECHO on 11/03/15 showed normal EF, G1DD, mild RAE, severe LAE, PA pressure 42.  On 11/04/15 PM she went into afib with RVR. She is completely asymptomatic with this. No CP or SOB. SHe does not feel palpitations. No LE edema, orthopnea or PND. NO dizziness or syncope. She did have one episode of chest pain that felt like an intense pressure during and after her first HD session on 11/02/15. This resolved after a combination of mylanta, ASA, SL NTG and morphine. It has not recurred since. She is currently feeling much better but wary about going home today.     Past Medical History  Diagnosis Date  . GERD (gastroesophageal reflux disease)   . Glaucoma   . H/O renal calculi 2002 & 2006  . H/O hiatal hernia   . Headache(784.0)     migraine  . Pneumonia     dx 10-06-2014 per CXR--  on 10-27-2014 pt states finished antibiotic and denies cough or fever  . Hyperlipidemia   . History of MI (myocardial infarction)     10/ 2013 in setting of Septic Shock  . History of atrial fibrillation     10/ 2013  in setting of Septic Shock  . History of CHF (congestive heart failure)     10/ 2013 in setting  of septic shock  . Sigmoid diverticulosis   . Nephrolithiasis     bilateral  . CKD (chronic kidney disease), stage III     nephrologist-  dr Florene Glen  . Right ureteral stone   . History of acute respiratory failure     10/ 2013  -- ventilated in setting septic shock  . Dysrhythmia     Afib in 2013 when she had septic shock related to stone ureteral obstruction  . Myocardial infarction [facility])     Was reported in 2013 during hospitalization with septic shock  . Septic shock ([place]) 04/04/2012  . Sepsis ([place])   . Hypertension     medication removed from regimen due to low blood pressure   . Peripheral vascular disease ([place])   . S/P hemodialysis catheter insertion ([place])  04/11/2015     right anterior chest , only used once   . History of nephrostomy 04/11/2015     currently inplace 04/28/2015   . History of blood transfusion 04/13/2015   . Low iron     hx  . Glaucoma   . History of blood product transfusion   . Coronary artery disease   . Complication of anesthesia     use a little anesthesia , per patient MD states she quit breathing (2016); hard to wake up     Past Surgical History  Procedure Laterality Date  . Cystoscopy w/ ureteral stent placement  04/04/2012    Procedure: CYSTOSCOPY WITH RETROGRADE PYELOGRAM/URETERAL STENT PLACEMENT;  Surgeon: Ailene Rud, MD;  Location: [place];  Service: Urology;  Laterality: Left;  . Total abdominal hysterectomy w/ bilateral salpingoophorectomy  1993    secondary to fibroids  . Breast biopsy Left 08/23/07    benign fibrocystic with duct ectasia  . Cardiac catheterization  07-11-2012  dr Irish Lack    Abnormal stress test/   normal coronary arteries/  LVEDP  29mmHg  . Cardiovascular stress test  06-26-2012  dr Irish Lack    marked ischemia in the basal anterior, mid anterior, apical inferior regions/  normal LVF, ef 63%  . Transthoracic echocardiogram  04-09-2012    normal LVF,  ef 60-65%,  mild LAE,  mild TR, trivial MR and PR  . Cataract extraction w/ intraocular lens  implant, bilateral    . Knee arthroscopy Left 02-14-2003  . Laparoscopic cholecystectomy  03-23-2005  . Extracorporeal shock wave lithotripsy  05-28-2012  &  10-08-2012  . Cystoscopy with stent placement Right 10/28/2014    Procedure: RIGHT URETERAL STENT PLACEMENT;  Surgeon: Irine Seal, MD;  Location: [facility];  Service: Urology;  Laterality: Right;  . Cystoscopy/retrograde/ureteroscopy/stone extraction with basket Right 11/21/2014    Procedure: CYSTOSCOPY/RIGHT RETROGRADE PYELOGRAM/RIGHT URETEROSCOPY/BASKET EXTRACTION/RIGHT PYELOSCOPY/LASER OF STONE/RIGHT DOUBLE J STENT;  Surgeon: Carolan Clines, MD;  Location: [place];  Service: Urology;  Laterality: Right;  . Holmium laser application Right [id number]    Procedure: HOLMIUM LASER APPLICATION;  Surgeon: Carolan Clines, MD;  Location[facility];  Service: Urology;  Laterality: Right;  . Cystoscopy with stent placement Right 02/26/2015    Procedure: CYSTOSCOPY RETROGRADE PYELOGRAM WITH STENT PLACEMENT;  Surgeon: Cleon Gustin, MD;  Location: [facility];  Service: Urology;  Laterality: Right;  . Cystoscopy w/ ureteral stent placement Bilateral 05/04/2015    Procedure: CYSTOSCOPY WITH BILATERAL RETROGRADE PYELOGRAM/ WITH INTERPRETATION, EXCHANGE OF RIGHT URETERAL STENT REPLACEMENT AND PLACEMENT LEFT URETERAL STENT PLACEMENT EXAMINATION OF VAGINA;  Surgeon: Carolan Clines, MD;  Location: [facility];  Service: Urology;  Laterality: Bilateral;  . Av fistula placement Left 06/02/2015    Procedure: BRACHIOCEPHALIC ARTERIOVENOUS (AV) FISTULA CREATION ;  Surgeon: Conrad Kelso, MD;  Location: [place];  Service: Vascular;  Laterality: Left;  . Abdominal hysterectomy    . Bascilic vein transposition Left 07/27/2015    Procedure: FIRST STAGE BASILIC VEIN TRANSPOSITION LEFT UPPER ARM;  Surgeon: Conrad Butts, MD;  Location: [place];  Service: Vascular;  Laterality: Left;  . Bascilic vein transposition Left 09/2015    second phase  . Bascilic vein transposition Left 10/12/2015    Procedure: SECOND STAGE BASILIC VEIN TRANSPOSITION LEFT ARM;  Surgeon: Conrad Brownwood, MD;  Location: [place];  Service: Vascular;  Laterality: Left;    Allergies  Allergen Reactions  . Vicodin [Hydrocodone-Acetaminophen] Nausea And Vomiting  . Chlorhexidine Rash    Sunburn    rash  . Percocet [Oxycodone-Acetaminophen] Nausea And Vomiting    I have reviewed the patient's current medications . cefTAZidime (FORTAZ)  IV  2 g Intravenous Q T,Th,Sa-HD  . [START ON 11/06/2015] darbepoetin (ARANESP) injection - DIALYSIS  60 mcg Intravenous Q Fri-HD  . famotidine  20 mg Oral  Daily  . feeding supplement (NEPRO CARB STEADY)  237 mL Oral TID BM  . heparin  5,000 Units Subcutaneous Q8H  . latanoprost  1 drop Both Eyes QHS  . metoprolol tartrate  12.5 mg Oral BID  . saccharomyces boulardii  250 mg Oral BID  . timolol  1 drop Both Eyes q morning - 10a  . triamcinolone  1 application Topical BID     acetaminophen **OR** acetaminophen, albuterol, alum & mag hydroxide-simeth, feeding supplement (NEPRO CARB STEADY), metoprolol, morphine injection, nitroGLYCERIN, nystatin cream, ondansetron **OR** ondansetron (ZOFRAN) IV, sodium chloride, traMADol  Prior to Admission medications   Medication Sig Start Date End Date Taking? Authorizing Provider  acetaminophen (TYLENOL) 500 MG tablet Take 1,000 mg by mouth every 6 (six) hours as needed for moderate pain or headache.    Yes Historical Provider, MD  bimatoprost (LUMIGAN) 0.01 % SOLN Place 1 drop into both eyes at bedtime.   Yes Historical Provider, MD  metoprolol tartrate (LOPRESSOR) 25 MG tablet TAKE 1/2 TABLET BY MOUTH TWICE DAILY Patient taking differently: Take one-half tablet (12.5 mg) by mouth twice daily. 09/02/15  Yes Jettie Booze, MD  Nutritional Supplements (FEEDING SUPPLEMENT, NEPRO CARB STEADY,) LIQD Take 237 mLs by mouth daily. Patient taking differently: Take 237 mLs by mouth daily as needed (nutritional supplement).  05/07/15  Yes Maryann Mikhail, DO  Nystatin ([place]) 100000 UNIT/GM POWD Apply 1 application topically 2 (two) times daily.  09/16/15  Yes Historical Provider, MD  nystatin cream (MYCOSTATIN) Apply 1 application topically 2 (two) times daily. Apply to affected area BID for up to 7 days. 10/28/15  Yes Kem Boroughs, FNP  ranitidine (ZANTAC) 150 MG tablet Take 150 mg by mouth 2 (two) times daily as needed for heartburn.   Yes Historical Provider, MD  saccharomyces boulardii (FLORASTOR) 250 MG capsule Take 1 capsule (250 mg total) by mouth 2 (two) times daily. 07/08/15  Yes Donne Hazel, MD    sodium bicarbonate 650 MG tablet Take 1 tablet (650 mg total) by mouth 2 (two) times daily. Patient taking differently: Take 650 mg by mouth 3 (three) times daily.  05/30/15  Yes Robbie Lis, MD  sodium chloride (OCEAN) 0.65 % SOLN nasal spray Place 1 spray into both nostrils 3 (three) times daily as needed for congestion.  Yes Historical Provider, MD  timolol (BETIMOL) 0.5 % ophthalmic solution Place 1 drop into both eyes every morning.    Yes Historical Provider, MD  traMADol (ULTRAM) 50 MG tablet Take 1 tablet (50 mg total) by mouth every 6 (six) hours as needed. Patient taking differently: Take 50 mg by mouth every 6 (six) hours as needed for moderate pain or severe pain.  10/12/15  Yes Samantha J Rhyne, PA-C  triamcinolone (KENALOG) 0.025 % ointment Apply 1 application topically 2 (two) times daily. 10/28/15  Yes Kem Boroughs, FNP  fluconazole (DIFLUCAN) 150 MG tablet Take one tablet and repeat in 48 hours.  Then again 3 days later Patient not taking: Reported on 10/30/2015 10/28/15   Kem Boroughs, FNP     Social History   Social History  . Marital Status: Widowed    Spouse Name: N/A  . Number of Children: 1  . Years of Education: N/A   Occupational History  . retired   . legal assistant    Social History Main Topics  . Smoking status: Never Smoker   . Smokeless tobacco: Never Used  . Alcohol Use: No  . Drug Use: No  . Sexual Activity: No     Comment: widow husband passed 5/05 with lung cancer   Other Topics Concern  . Not on file   Social History Narrative    Family Status  Relation Status Death Age  . Mother Deceased 32    dementia  . Brother Deceased 64    DM & heart disease  . Father Deceased 74    pancreatic ca   Family History  Problem Relation Age of Onset  . Hypertension Mother   . Cancer Mother 33    breast  . Dementia Mother   . Hypertension Brother   . Diabetes Brother   . Heart disease Brother     before age 35  . Cancer Father 52     pancreatic  . Heart failure Paternal Grandmother   . Bladder Cancer Maternal Grandfather      ROS:  Full 14 point review of systems complete and found to be negative unless listed above.  Physical Exam: Blood pressure 110/50, pulse 115, temperature 97.8 F (36.6 C), temperature source Oral, resp. rate 18, height 5\' 3"  (1.6 m), weight 171 lb 8.3 oz (77.8 kg), last menstrual period 01/19/1992, SpO2 98 %.  General: Well developed, well nourished, female in no acute distress Head: Eyes PERRLA, No xanthomas.   Normocephalic and atraumatic, oropharynx without edema or exudate. Lungs: CTAB Heart: HRRR S1 S2, no rub/gallop, Heart irregular rate and rhythm, tachy with S1, S2  murmur. pulses are 2+ extrem.   Neck: No carotid bruits. No lymphadenopathy. No  JVD. Abdomen: Bowel sounds present, abdomen soft and non-tender without masses or hernias noted. Msk:  No spine or cva tenderness. No weakness, no joint deformities or effusions. Extremities: No clubbing or cyanosis.  No LE edema.  Neuro: Alert and oriented X 3. No focal deficits noted. Psych:  Good affect, responds appropriately Skin: No rashes or lesions noted.  Labs:   Lab Results  Component Value Date   WBC 13.5* 11/05/2015   HGB 10.6* 11/05/2015   HCT 34.7* 11/05/2015   MCV 87.4 11/05/2015   PLT 295 11/05/2015   No results for input(s): INR in the last 72 hours.  Recent Labs Lab 11/05/15 0718  NA 136  K 4.1  CL 96*  CO2 26  BUN 30*  CREATININE 4.88*  CALCIUM  8.7*  GLUCOSE 145*  ALBUMIN 2.2*   MAGNESIUM  Date Value Ref Range Status  05/25/2015 1.6* 1.7 - 2.4 mg/dL Final    Recent Labs  11/02/15 2010 11/03/15 0007 11/03/15 0543  TROPONINI <0.03 <0.03 0.03    Lab Results  Component Value Date   CHOL 123 07/04/2015   HDL 43 07/04/2015   LDLCALC 63 07/04/2015   TRIG 84 07/04/2015     Echo: 11/03/2015 LV EF: 55% - 60% Study Conclusions - Left ventricle: The cavity size was normal. Wall thickness was   increased in a pattern of mild LVH. There was mild concentric  hypertrophy. Systolic function was normal. The estimated ejection  fraction was in the range of 55% to 60%. Wall motion was normal;  there were no regional wall motion abnormalities. Doppler  parameters are consistent with abnormal left ventricular  relaxation (grade 1 diastolic dysfunction). Doppler parameters  are consistent with indeterminate ventricular filling pressure. - Aortic valve: Transvalvular velocity was within the normal range.  There was no stenosis. There was no regurgitation. - Mitral valve: There was trivial regurgitation. - Left atrium: The atrium was severely dilated. - Right ventricle: The cavity size was normal. Wall thickness was  normal. Systolic function was normal. - Right atrium: The atrium was mildly dilated. - Atrial septum: No defect or patent foramen ovale was identified  by color flow Doppler. - Tricuspid valve: Transvalvular velocity was within the normal  range. There was mild regurgitation. - Pulmonary arteries: Systolic pressure was mildly increased. PA  peak pressure: 42 mm Hg (S). - Inferior vena cava: The vessel was normal in size. The  respirophasic diameter changes were blunted (< 50%), consistent  with elevated central venous pressure.  ECG:  Atrial fibrillation with rapid ventricular response HR 137  Radiology:  Ir Nephrostomy Exchange Right  11/04/2015  CLINICAL DATA:  Obstructive right hydronephrosis, chronic indwelling right percutaneous nephrostomy catheter and double-J ureteral stent. Routine exchange. EXAM: RIGHT PERCUTANEOUS NEPHROSTOMY CATHETER EXCHANGE UNDER FLUOROSCOPY FLUOROSCOPY TIME:  0.9 minutes, 55  uGym2 DAP TECHNIQUE: The nephrostomy tube and surrounding skin were prepped with Betadine, draped in usual sterile fashion. A small amount of contrast was injected through the right nephrostomy catheter to opacify the renal collecting system. The catheter was cut  and exchanged over a 0.035" angiographic wire for a new 10-French DAWSON MUELLER pigtail catheter, formed centrally within the collecting system under fluoroscopy. Contrast injection confirms appropriate positioning. The catheter was secured externally with a Statlock device. The patient tolerated the procedure well. Complications: None. IMPRESSION: 1. Technically successful exchange of right nephrostomy catheter under fluoroscopy Electronically Signed   By: Lucrezia Europe M.D.   On: 11/04/2015 15:11    ASSESSMENT AND PLAN:    Principal Problem:   Sepsis secondary to UTI [facility]) Active Problems:   Glaucoma   Essential hypertension, benign   Anemia of renal disease   CKD (chronic kidney disease) stage 5, GFR less than 15 ml/min (HCC)   Hyperkalemia    [name] is a 70 y.o. female with a history of CKD stage V, nephrolithiasis s/p right nephrostomy tube, HTN, and previous LV dysfunction and PAF in the setting of sepsis who presented to [facility] ED on 10/30/15 with confusion and fever. She was found to have sepsis 2/2 UTI and nephrostomy tube. She went into afib with RVR today and cardiology was consulted.    Atrial fibrillation with RVR: went into afib late last night. She had one previous episode of afib in  2013 in a similar situation in the setting of sepsis. She has not been anticoagulated but was previously treated with amiodarone. She is asymptomatic with atrial fibrillation and denies chest pain or SOB.  -- She is currently on her home dose of Lopressor 12.mg BID. Rate note well controlled in 110-150s. BPs are soft. Could probably increase Lopressor to 25mg  BID but worry about her getting hypotensive with HD. Consider loading with amiodarone. -- CHADSVASC score 4 (CHF, HTN, Age, F sex). Will likely need long term AC with coumadin given her ESRD.  -- 2D ECHO with normal EF and severe LAE. Will check TSH  Chest pain: she did have one episode of chest pain during her first HD session on 11/02/15.  ECG with no acute ST or TW changes. Serial troponin negative. She had an abnormal myoview in 06/2012 followed by a cardiac cath with no CAD. No further chest pain. Would not pursue further ischemic w/u.  ESRD on HD: initiated 11/02/15. Followed by Dr. Florene Glen  Essential hypertension: BPs have been soft. Continue Lopressor 12.5mg  BID  Hx of Cardiomyopathy: Related to sepsis. Resolved by all subsequent echos. ECHO this admission with normal EF.   Sepsis: resolving. Per IM  Signed: Angelena Form, PA-C 11/05/2015 12:29 PM  Pager [phone]  Co-Sign MD  Patient examined chart reviewed She feels "sick" some stomach upset No chest pain. Afib rate still high Fistula in LUE not mature Dialysis catheter in right subclavian tunneled.  Cath 2014 no CAD and enzymes Negative with no acute ECG changes. Agree with increased lopressor dose and iv amiodarone  She has a cough With pain from previous nausea vomiting will write for some cough Supressant  Jenkins Rouge

## 2015-11-05 NOTE — Progress Notes (Signed)
PROGRESS NOTE    [name]  [id number] DOB: [birth year] DOA: 10/30/2015 PCP: [name], MD  Outpatient Specialists: Dr. Florene Glen    Brief Narrative:  70 y.o. female with medical history significant of CKD stage V, nephrolithiasis, HTN, CAD, anemia of chronic disease, s/p right nephrostomy tube; who presented with complaints of 1 day history of confusion and fever. Patient noted to have  WBC of 20k with UA suggestive of UTI. Patient was started on IVF and antibiotics. She is followed by Dr. Florene Glen as outpatient for stage 5 CKD with fistula placed in the past few weeks, already has tunneled dialysis catheter. Presenting Cr noted to be 8.06 with BUN of 82, no significant improvement with IV fluids, seen by renal, started hemodialysis on 5/15, was transferred to stepdown 5/15 for an episode of chest pain post hemodialysis, with negative workup so far.   Assessment & Plan:   Principal Problem:   Sepsis secondary to UTI [facility]) Active Problems:   Glaucoma   Essential hypertension, benign   Anemia of renal disease   CKD (chronic kidney disease) stage 5, GFR less than 15 ml/min (HCC)   Hyperkalemia  Sepsis secondary to UTI related to nephrostomy tube. - Sepsis criteria with 100.44F temp , WBC of 20.7, and positive urinalysis. Previous history of Pseudomonas infection noted in the past. - Empiric vancomycin and cefepime started on admission, stopped IV vancomycin giving negative cultures - Urine culture growing Pseudomonas aeruginosa, sensitive to cefepime-if unable to give across dialysis then consider changing to ceftaz edema. Blood cultures 2: Negative to date.. - Changed to IV Fortaz across HD-as discussed with nephrology, complete total 14 days course and then consider suppressive antibiotics. Outpatient follow-up with Dr. Era Bumpers, urology.  Chronic kidney disease stage V:  - Presenting Creatinine elevated at 8.06 with a BUN of 82, he outpatient creatinine around 6 -  Patient has left arm AVF, as well tunneled dialysis catheter. - Patient started hemodialysis on 11/02/2015. - Management per renal. Sees Dr. Florene Glen as outpatient.  Chronic obstructive uropathy - Patient with right nephrostomy tube, changed 5/17 by IR. Outpatient follow-up with Dr. Era Bumpers.  Acute encephalopathy  - In the setting of infectious process, resolved,  Essential hypertension - Continued on metoprolol  Glaucoma  - continue home eyedrops   GERD - Cont H2 blocker  Anemia of chronic renal disease - Aranesp per renal  Chest pain - Muscular skeletal feature, troponins negative 3, EKG nonacute, with repeat EKG, and 2-D echo: LVEF 55-60 percent, no wall motion abnormalities and grade 1 diastolic dysfunction.  Leukocytosis - improving  Paroxysmal A. fib with RVR - Noted on telemetry 5/16 night. As per review of her cardiologist's note on 11/21/14: History of A. fib in the context of acute illness, taken off amiodarone, resolved cardiomyopathy.  - Patient returned from HD on 5/18 with heart rates in the 130s. EKG shows A. fib with RVR. - Cardiology consulted >await final recommendations: may consider increasing BB's Vs Amiodarone and starting Coumadin.   DVT prophylaxis: Heparin subQ Code Status: Full Family Communication: Discussed with patient. No family at bedside. Disposition Plan: DC home when medically stable.   Consultants:   Nephrology  Procedures:   HD  Antimicrobials: Anti-infectives    Start     Dose/Rate Route Frequency Ordered Stop   11/05/15 1200  cefTAZidime (FORTAZ) 2 g in dextrose 5 % 50 mL IVPB     2 g 100 mL/hr over 30 Minutes Intravenous Every T-Th-Sa (Hemodialysis) 11/05/15 1033  11/01/15 1200  vancomycin (VANCOCIN) 500 mg in sodium chloride 0.9 % 100 mL IVPB     500 mg 100 mL/hr over 60 Minutes Intravenous  Once 11/01/15 1131 11/01/15 1637   11/01/15 0600  ceFEPIme (MAXIPIME) 1 g in dextrose 5 % 50 mL IVPB  Status:  Discontinued      1 g 100 mL/hr over 30 Minutes Intravenous Every 24 hours 10/31/15 0441 11/05/15 1030   10/31/15 0315  vancomycin (VANCOCIN) IVPB 1000 mg/200 mL premix     1,000 mg 200 mL/hr over 60 Minutes Intravenous  Once 10/31/15 0301 10/31/15 0416   10/31/15 0315  ceFEPIme (MAXIPIME) 1 g in dextrose 5 % 50 mL IVPB     1 g 100 mL/hr over 30 Minutes Intravenous  Once 10/31/15 0304 10/31/15 0610          Subjective: Seen at HD- denies complaints. Denies pain.After patient returned from hemodialysis, RN reported irregular tachycardia in the 130s, asymptomatic of dyspnea, chest pain, palpitations, dizziness or lightheadedness.  Objective: Filed Vitals:   11/05/15 1000 11/05/15 1030 11/05/15 1055 11/05/15 1159  BP: 115/59 105/57 110/63 110/50  Pulse: 108 99 118 115  Temp:   98 F (36.7 C) 97.8 F (36.6 C)  TempSrc:   Oral Oral  Resp:   20 18  Height:      Weight:   77.8 kg (171 lb 8.3 oz)   SpO2:   99% 98%    Intake/Output Summary (Last 24 hours) at 11/05/15 1537 Last data filed at 11/05/15 1435  Gross per 24 hour  Intake    540 ml  Output   1805 ml  Net  -1265 ml   Filed Weights   11/04/15 2301 11/05/15 0700 11/05/15 1055  Weight: 79.4 kg (175 lb 0.7 oz) 78.6 kg (173 lb 4.5 oz) 77.8 kg (171 lb 8.3 oz)    Examination:  General exam: Appears calm and comfortable, laying in bed undergoing HD this morning. Respiratory system: Clear to auscultation. Respiratory effort normal. Cardiovascular system: S1 & S2 heard, RRR. No pedal edema.  Gastrointestinal system: Abdomen is nondistended, soft and nontender.  BS+, right nephrostomy tube site without acute findings Central nervous system: Alert and oriented. No focal neurological deficits. Extremities: Symmetric 5 x 5 power. Skin: No rashes, lesions Psychiatry: Judgement and insight appear normal. Mood & affect appropriate.     Data Reviewed: I have personally reviewed following labs and imaging studies  CBC:  Recent Labs Lab  10/31/15 0009 11/01/15 0608 11/02/15 0534 11/03/15 0543 11/04/15 0515 11/05/15 0530  WBC 20.7* 8.7 11.2* 16.4* 12.6* 13.5*  NEUTROABS 18.3*  --   --   --   --   --   HGB 9.7* 9.4* 9.2* 9.5* 9.4* 10.6*  HCT 30.2* 30.6* 30.0* 30.0* 30.7* 34.7*  MCV 83.9 87.9 87.0 87.2 88.0 87.4  PLT 385 344 355 245 286 [id number]   Basic Metabolic Panel:  Recent Labs Lab 11/01/15 0608 11/02/15 0534 11/03/15 0543 11/04/15 0515 11/05/15 0718  NA 138 139 139 138 136  K 5.0 4.8 4.6 4.9 4.1  CL 108 106 101 100* 96*  CO2 16* 20* 24 24 26   GLUCOSE 86 95 126* 102* 145*  BUN 73* 72* 48* 53* 30*  CREATININE 7.69* 7.87* 6.52* 6.69* 4.88*  CALCIUM 9.0 9.0 9.1 9.0 8.7*  PHOS 5.3* 5.0*  --   --  3.1   GFR: Estimated Creatinine Clearance: 10.6 mL/min (by C-G formula based on Cr of 4.88). Liver Function Tests:  Recent Labs Lab 11/01/15 0608 11/02/15 0534 11/05/15 0718  ALBUMIN 2.4* 2.4* 2.2*   No results for input(s): LIPASE, AMYLASE in the last 168 hours. No results for input(s): AMMONIA in the last 168 hours. Coagulation Profile: No results for input(s): INR, PROTIME in the last 168 hours. Cardiac Enzymes:  Recent Labs Lab 11/02/15 2010 11/03/15 0007 11/03/15 0543  TROPONINI <0.03 <0.03 0.03   BNP (last 3 results) No results for input(s): PROBNP in the last 8760 hours. HbA1C: No results for input(s): HGBA1C in the last 72 hours. CBG:  Recent Labs Lab 11/01/15 0227  GLUCAP 73   Lipid Profile: No results for input(s): CHOL, HDL, LDLCALC, TRIG, CHOLHDL, LDLDIRECT in the last 72 hours. Thyroid Function Tests: No results for input(s): TSH, T4TOTAL, FREET4, T3FREE, THYROIDAB in the last 72 hours. Anemia Panel: No results for input(s): VITAMINB12, FOLATE, FERRITIN, TIBC, IRON, RETICCTPCT in the last 72 hours. Urine analysis:    Component Value Date/Time   COLORURINE YELLOW 10/31/2015 0034   APPEARANCEUR TURBID* 10/31/2015 0034   LABSPEC 1.011 10/31/2015 0034   PHURINE 6.0 10/31/2015  0034   GLUCOSEU NEGATIVE 10/31/2015 0034   HGBUR LARGE* 10/31/2015 0034   BILIRUBINUR NEGATIVE 10/31/2015 0034   BILIRUBINUR neg 09/18/2014 0920   KETONESUR NEGATIVE 10/31/2015 0034   PROTEINUR 100* 10/31/2015 0034   PROTEINUR 2+ 09/18/2014 0920   UROBILINOGEN 0.2 05/02/2015 2020   UROBILINOGEN negative 09/18/2014 0920   NITRITE NEGATIVE 10/31/2015 0034   NITRITE neg 09/18/2014 0920   LEUKOCYTESUR LARGE* 10/31/2015 0034   Sepsis Labs:  Recent Labs Lab 10/31/15 0021  LATICACIDVEN 0.97    Recent Results (from the past 240 hour(s))  Culture, blood (routine x 2)     Status: None   Collection Time: 10/31/15 12:10 AM  Result Value Ref Range Status   Specimen Description BLOOD RIGHT FOREARM  Final   Special Requests BOTTLES DRAWN AEROBIC AND ANAEROBIC 5CC  Final   Culture   Final    NO GROWTH 5 DAYS Performed at [facility]    Report Status 11/05/2015 FINAL  Final  Urine culture     Status: Abnormal   Collection Time: 10/31/15 12:34 AM  Result Value Ref Range Status   Specimen Description URINE, RANDOM  Final   Special Requests NONE  Final   Culture >=100,000 COLONIES/mL PSEUDOMONAS AERUGINOSA (A)  Final   Report Status 11/03/2015 FINAL  Final   Organism ID, Bacteria PSEUDOMONAS AERUGINOSA (A)  Final      Susceptibility   Pseudomonas aeruginosa - MIC*    CEFTAZIDIME 4 SENSITIVE Sensitive     CIPROFLOXACIN 0.5 INTERMEDIATE Intermediate     GENTAMICIN 2 SENSITIVE Sensitive     IMIPENEM 1 SENSITIVE Sensitive     PIP/TAZO 16 SENSITIVE Sensitive     CEFEPIME 2 SENSITIVE Sensitive     * >=100,000 COLONIES/mL PSEUDOMONAS AERUGINOSA  Culture, blood (routine x 2)     Status: None   Collection Time: 10/31/15 12:56 AM  Result Value Ref Range Status   Specimen Description BLOOD RIGHT ARM  Final   Special Requests BOTTLES DRAWN AEROBIC AND ANAEROBIC 10CC  Final   Culture   Final    NO GROWTH 5 DAYS Performed at [facility]    Report Status 11/05/2015 FINAL   Final  MRSA PCR Screening     Status: None   Collection Time: 10/31/15  7:37 AM  Result Value Ref Range Status   MRSA by PCR NEGATIVE NEGATIVE Final  Comment:        The GeneXpert MRSA Assay (FDA approved for NASAL specimens only), is one component of a comprehensive MRSA colonization surveillance program. It is not intended to diagnose MRSA infection nor to guide or monitor treatment for MRSA infections.          Radiology Studies: Ir Nephrostomy Exchange Right  11/04/2015  CLINICAL DATA:  Obstructive right hydronephrosis, chronic indwelling right percutaneous nephrostomy catheter and double-J ureteral stent. Routine exchange. EXAM: RIGHT PERCUTANEOUS NEPHROSTOMY CATHETER EXCHANGE UNDER FLUOROSCOPY FLUOROSCOPY TIME:  0.9 minutes, 26  uGym2 DAP TECHNIQUE: The nephrostomy tube and surrounding skin were prepped with Betadine, draped in usual sterile fashion. A small amount of contrast was injected through the right nephrostomy catheter to opacify the renal collecting system. The catheter was cut and exchanged over a 0.035" angiographic wire for a new 10-French DAWSON MUELLER pigtail catheter, formed centrally within the collecting system under fluoroscopy. Contrast injection confirms appropriate positioning. The catheter was secured externally with a Statlock device. The patient tolerated the procedure well. Complications: None. IMPRESSION: 1. Technically successful exchange of right nephrostomy catheter under fluoroscopy Electronically Signed   By: Lucrezia Europe M.D.   On: 11/04/2015 15:11        Scheduled Meds: . cefTAZidime (FORTAZ)  IV  2 g Intravenous Q T,Th,Sa-HD  . [START ON 11/06/2015] darbepoetin (ARANESP) injection - DIALYSIS  60 mcg Intravenous Q Fri-HD  . famotidine  20 mg Oral Daily  . feeding supplement (NEPRO CARB STEADY)  237 mL Oral TID BM  . heparin  5,000 Units Subcutaneous Q8H  . latanoprost  1 drop Both Eyes QHS  . metoprolol tartrate  12.5 mg Oral BID  .  saccharomyces boulardii  250 mg Oral BID  . timolol  1 drop Both Eyes q morning - 10a  . triamcinolone  1 application Topical BID   Continuous Infusions:     LOS: 5 days    HONGALGI,ANAND, MD [facility] Pager [phone]  If 7PM-7AM, please contact night-coverage [email or web address] Password [facility] 11/05/2015, 3:37 PM

## 2015-11-05 NOTE — Progress Notes (Signed)
Pharmacy Antibiotic Note  [name] is a 70 y.o. female admitted on 10/30/2015 with sepsis secondary to UTI.  Pharmacy was consulted on 11/01/15  for vancomycin/cefepime dosing. Vancomycin discontinued 5/15.  Urine culture grew >100k Pseudomonas.  Patient with h/o CKD stage 5 , new start HD on 11/02/15.  HD #3 today .  Nephrologist recommends to change antibiotic to Ceftazidime post HD, as it is available to give at outpatient dialysis center.   Plan: Discontinue Cefepime Start Ceftazidime 2gm IV after each HD, QTTS- dose due today - Monitor C&S, CBC and LOT  Height: 5\' 3"  (160 cm) Weight: 173 lb 4.5 oz (78.6 kg) IBW/kg (Calculated) : 52.4  Temp (24hrs), Avg:97.9 F (36.6 C), Min:97 F (36.1 C), Max:98.4 F (36.9 C)   Recent Labs Lab 10/31/15 0021  11/01/15 0608 11/02/15 0534 11/03/15 0543 11/04/15 0515 11/05/15 0530 11/05/15 0718  WBC  --   --  8.7 11.2* 16.4* 12.6* 13.5*  --   CREATININE  --   < > 7.69* 7.87* 6.52* 6.69*  --  4.88*  LATICACIDVEN 0.97  --   --   --   --   --   --   --   VANCORANDOM  --   --   --  22  --   --   --   --   < > = values in this interval not displayed.  Estimated Creatinine Clearance: 10.7 mL/min (by C-G formula based on Cr of 4.88).    Allergies  Allergen Reactions  . Vicodin [Hydrocodone-Acetaminophen] Nausea And Vomiting  . Chlorhexidine Rash    Sunburn    rash  . Percocet [Oxycodone-Acetaminophen] Nausea And Vomiting    Antimicrobials this admission: Vancomycin 5/13 >>5/15 Cefepime 5/13 >>5/18 Ceftazidime 5/18>>  Microbiology results: 5/13 BCx - ngtd x 4 days 5/13 UCx - >100K pseudomonas (sens to ceftazidime, cefepime, imipenem, zosyn, gentamicin.  Intermediate to cipro)  5/13 MRSA PCR - neg  Thank you for allowing pharmacy to be a part of this patient's care. Nicole Cella, RPh Clinical Pharmacist Pager: [phone] 11/05/2015 10:33 AM

## 2015-11-06 DIAGNOSIS — L299 Pruritus, unspecified: Secondary | ICD-10-CM | POA: Insufficient documentation

## 2015-11-06 DIAGNOSIS — D631 Anemia in chronic kidney disease: Secondary | ICD-10-CM | POA: Insufficient documentation

## 2015-11-06 DIAGNOSIS — D509 Iron deficiency anemia, unspecified: Secondary | ICD-10-CM | POA: Insufficient documentation

## 2015-11-06 DIAGNOSIS — A419 Sepsis, unspecified organism: Secondary | ICD-10-CM

## 2015-11-06 DIAGNOSIS — N390 Urinary tract infection, site not specified: Secondary | ICD-10-CM

## 2015-11-06 DIAGNOSIS — I4891 Unspecified atrial fibrillation: Secondary | ICD-10-CM | POA: Diagnosis not present

## 2015-11-06 DIAGNOSIS — R0602 Shortness of breath: Secondary | ICD-10-CM

## 2015-11-06 DIAGNOSIS — N2581 Secondary hyperparathyroidism of renal origin: Secondary | ICD-10-CM | POA: Insufficient documentation

## 2015-11-06 DIAGNOSIS — D688 Other specified coagulation defects: Secondary | ICD-10-CM | POA: Insufficient documentation

## 2015-11-06 HISTORY — DX: Shortness of breath: R06.02

## 2015-11-06 LAB — HEPARIN LEVEL (UNFRACTIONATED): Heparin Unfractionated: 0.2 IU/mL — ABNORMAL LOW (ref 0.30–0.70)

## 2015-11-06 MED ORDER — GUAIFENESIN-DM 100-10 MG/5ML PO SYRP
5.0000 mL | ORAL_SOLUTION | Freq: Four times a day (QID) | ORAL | Status: DC | PRN
  Administered 2015-11-06 – 2015-11-07 (×4): 5 mL via ORAL
  Filled 2015-11-06 (×4): qty 5

## 2015-11-06 MED ORDER — WARFARIN SODIUM 5 MG PO TABS
5.0000 mg | ORAL_TABLET | Freq: Once | ORAL | Status: AC
Start: 2015-11-06 — End: 2015-11-06
  Administered 2015-11-06: 5 mg via ORAL
  Filled 2015-11-06: qty 1

## 2015-11-06 MED ORDER — HEPARIN (PORCINE) IN NACL 100-0.45 UNIT/ML-% IJ SOLN
1500.0000 [IU]/h | INTRAMUSCULAR | Status: DC
Start: 2015-11-06 — End: 2015-11-11
  Administered 2015-11-06: 1050 [IU]/h via INTRAVENOUS
  Administered 2015-11-07 – 2015-11-09 (×2): 1350 [IU]/h via INTRAVENOUS
  Administered 2015-11-10: 1500 [IU]/h via INTRAVENOUS
  Administered 2015-11-10: 1350 [IU]/h via INTRAVENOUS
  Filled 2015-11-06 (×7): qty 250

## 2015-11-06 MED ORDER — WARFARIN - PHARMACIST DOSING INPATIENT
Freq: Every day | Status: DC
Start: 2015-11-06 — End: 2015-11-11

## 2015-11-06 MED ORDER — DARBEPOETIN ALFA 60 MCG/0.3ML IJ SOSY
60.0000 ug | PREFILLED_SYRINGE | INTRAMUSCULAR | Status: DC
  Administered 2015-11-07: 60 ug via INTRAVENOUS
  Filled 2015-11-06: qty 0.3

## 2015-11-06 NOTE — Progress Notes (Signed)
ANTICOAGULATION CONSULT NOTE - Initial Consult  Pharmacy Consult for heparin + warfarin Indication: atrial fibrillation  Allergies  Allergen Reactions  . Vicodin [Hydrocodone-Acetaminophen] Nausea And Vomiting  . Chlorhexidine Rash    Sunburn    rash  . Percocet [Oxycodone-Acetaminophen] Nausea And Vomiting    Patient Measurements: Height: 5\' 3"  (160 cm) Weight: 171 lb 8.3 oz (77.8 kg) IBW/kg (Calculated) : 52.4 Heparin Dosing Weight: 70kg  Vital Signs: BP: 93/51 mmHg (05/19 1000)  Labs:  Recent Labs  11/04/15 0515 11/05/15 0530 11/05/15 0718  HGB 9.4* 10.6*  --   HCT 30.7* 34.7*  --   PLT 286 295  --   CREATININE 6.69*  --  4.88*    Estimated Creatinine Clearance: 10.6 mL/min (by C-G formula based on Cr of 4.88).   Assessment: 56 YOF, new ESRD on HD patient, with AFib with RVR. Cardiology consulted, to start warfarin and heparin IV. Last dose of subq heparin this morning ~0600. Baseline Hgb 10.6, plts 295. Next due for Aranesp dose tomorrow. Is not on IV iron. No bleeding noted.  Goal of Therapy:  INR 2-3 Heparin level 0.3-0.7 units/ml Monitor platelets by anticoagulation protocol: Yes   Plan:  -Heparin 1050 units/hr -HL at 2200 -Warfarin 5mg  po x1 -Daily HL, INR, and CBC  Remo Kirschenmann D. Amil Bouwman, PharmD, BCPS Clinical Pharmacist Pager: [phone] 11/06/2015 1:39 PM

## 2015-11-06 NOTE — Progress Notes (Signed)
Admit: 10/30/2015 LOS: 6  70F CKD5, chronic R PCN, admit with pseudomonal UTI, new start HD as ESRD  Subjective:  Developed AFib with RVR overnight Cardiology following, starting coumadin HD#3 yesterday, 1L UF, post weight 77.8kg   05/18 0701 - 05/19 0700 In: 660 [P.O.:660] Out: 1855 [Urine:850]  Filed Weights   11/04/15 2301 11/05/15 0700 11/05/15 1055  Weight: 79.4 kg (175 lb 0.7 oz) 78.6 kg (173 lb 4.5 oz) 77.8 kg (171 lb 8.3 oz)    Scheduled Meds: . cefTAZidime (FORTAZ)  IV  2 g Intravenous Q T,Th,Sa-HD  . darbepoetin (ARANESP) injection - DIALYSIS  60 mcg Intravenous Q Fri-HD  . famotidine  20 mg Oral Daily  . feeding supplement (NEPRO CARB STEADY)  237 mL Oral TID BM  . heparin  5,000 Units Subcutaneous Q8H  . latanoprost  1 drop Both Eyes QHS  . metoprolol tartrate  25 mg Oral BID  . saccharomyces boulardii  250 mg Oral BID  . timolol  1 drop Both Eyes q morning - 10a  . triamcinolone  1 application Topical BID   Continuous Infusions: . amiodarone 30 mg/hr (11/06/15 1012)   PRN Meds:.acetaminophen **OR** acetaminophen, albuterol, alum & mag hydroxide-simeth, feeding supplement (NEPRO CARB STEADY), guaiFENesin-dextromethorphan, metoprolol, morphine injection, nitroGLYCERIN, nystatin cream, ondansetron **OR** ondansetron (ZOFRAN) IV, sodium chloride, traMADol  Current Labs: reviewed  B Cx 5/13x2: NGTD  Physical Exam:  Blood pressure 102/63, pulse 84, temperature 97.7 F (36.5 C), temperature source Oral, resp. rate 16, height 5\' 3"  (1.6 m), weight 77.8 kg (171 lb 8.3 oz), last menstrual period 01/19/1992, SpO2 99 %. NAD, RRR, no rub CTAB No edema R IJ TDC Maturing BVT +B/T in LUE  A 1. New ESRD 1. Tunneled HD Cath present prior to admission 2. Maturing AVF 3. CLIP to [facility] complete on THS Schedule 2. Pseudomonal UTI on cefepime with PCN Present on ceftazidime 3. Chronic PCN 4. CP episode 5/15 at end of HD and thereafter, troponin negative 5. AMS  resolved 6. AFib with RVR 7. Anemia on ESA 8. CKD-BMD: Phos ok  P 1. Cont HD on THS schedule 2. Check PTH   Pearson Grippe MD 11/06/2015, 11:57 AM   Recent Labs Lab 11/01/15 [id number] 11/02/15 0534 11/03/15 0543 11/04/15 0515 11/05/15 0718  NA 138 139 139 138 136  K 5.0 4.8 4.6 4.9 4.1  CL 108 106 101 100* 96*  CO2 16* 20* 24 24 26   GLUCOSE 86 95 126* 102* 145*  BUN 73* 72* 48* 53* 30*  CREATININE 7.69* 7.87* 6.52* 6.69* 4.88*  CALCIUM 9.0 9.0 9.1 9.0 8.7*  PHOS 5.3* 5.0*  --   --  3.1    Recent Labs Lab 10/31/15 0009  11/03/15 0543 11/04/15 0515 11/05/15 0530  WBC 20.7*  < > 16.4* 12.6* 13.5*  NEUTROABS 18.3*  --   --   --   --   HGB 9.7*  < > 9.5* 9.4* 10.6*  HCT 30.2*  < > 30.0* 30.7* 34.7*  MCV 83.9  < > 87.2 88.0 87.4  PLT 385  < > 245 286 295  < > = values in this interval not displayed.

## 2015-11-06 NOTE — Progress Notes (Signed)
Utilization review completed.  

## 2015-11-06 NOTE — Care Management Note (Signed)
Case Management Note Marvetta Gibbons RN, BSN Unit 2W-Case Manager [phone]  Patient Details  Name: [name] MRN: [id number] Date of Birth: 03/07/1946  Subjective/Objective:   Pt admitted with sepsis, went into afib  Action/Plan: PTA pt lived at home with daughter- independent- - plan to return home with daughter- pt currently on IV amio gtt-  Hx HD T/T/S-- no anticipated CM needs. Will follow  Expected Discharge Date:                  Expected Discharge Plan:  Home/Self Care  In-House Referral:     Discharge planning Services  CM Consult, Medication Assistance  Post Acute Care Choice:    Choice offered to:     DME Arranged:    DME Agency:     HH Arranged:    [place] Agency:     Status of Service:  Completed, signed off  Medicare Important Message Given:  Yes Date Medicare IM Given:    Medicare IM give by:    Date Additional Medicare IM Given:    Additional Medicare Important Message give by:     If discussed at [place] of Stay Meetings, dates discussed:    Additional Comments:  Dawayne Patricia, RN 11/06/2015, 12:03 PM

## 2015-11-06 NOTE — Progress Notes (Signed)
ANTICOAGULATION CONSULT NOTE - Consult  Pharmacy Consult for heparin  Indication: atrial fibrillation  Allergies  Allergen Reactions  . Vicodin [Hydrocodone-Acetaminophen] Nausea And Vomiting  . Chlorhexidine Rash    Sunburn    rash  . Percocet [Oxycodone-Acetaminophen] Nausea And Vomiting    Patient Measurements: Height: 5\' 3"  (160 cm) Weight: 171 lb 8.3 oz (77.8 kg) IBW/kg (Calculated) : 52.4 Heparin Dosing Weight: 70kg  Vital Signs: Temp: 97.6 F (36.4 C) (05/19 2015) Temp Source: Oral (05/19 2015) BP: 110/61 mmHg (05/19 2246) Pulse Rate: 108 (05/19 2015)  Labs:  Recent Labs  11/04/15 0515 11/05/15 0530 11/05/15 0718 11/06/15 2201  HGB 9.4* 10.6*  --   --   HCT 30.7* 34.7*  --   --   PLT 286 295  --   --   HEPARINUNFRC  --   --   --  0.20*  CREATININE 6.69*  --  4.88*  --     Estimated Creatinine Clearance: 10.6 mL/min (by C-G formula based on Cr of 4.88).   Assessment: 89 YOF, new ESRD on HD patient, with AFib with RVR. Cardiology consulted, to start warfarin and heparin IV. Last dose of subq heparin this morning ~0600. Baseline Hgb 10.6, plts 295. Next due for Aranesp dose tomorrow. Is not on IV iron. No bleeding noted.  Goal of Therapy:  INR 2-3 Heparin level 0.3-0.7 units/ml Monitor platelets by anticoagulation protocol: Yes   Plan:  -Heparin 1200 units/hr increase  Lavon Bothwell S. Alford Highland, PharmD, [facility] Clinical Staff Pharmacist Pager [phone]   11/06/2015 10:51 PM

## 2015-11-06 NOTE — Care Management Important Message (Signed)
Important Message  Patient Details  Name: [name] MRN: [id number] Date of Birth: Dec 12, 1945   Medicare Important Message Given:  Yes    Dawayne Patricia, RN 11/06/2015, 11:28 AM

## 2015-11-06 NOTE — Progress Notes (Signed)
Patient ID: [name], female   DOB: 04/05/1946, 70 y.o.   MRN: [id number]    Subjective:  Feels better HR better controlled  Objective:  Filed Vitals:   11/05/15 1159 11/05/15 1648 11/05/15 1857 11/05/15 2030  BP: 110/50 117/59 108/66 102/63  Pulse: 115 103 91 84  Temp: 97.8 F (36.6 C) 98.6 F (37 C)  97.7 F (36.5 C)  TempSrc: Oral Oral  Oral  Resp: 18 17  16   Height:      Weight:      SpO2: 98% 98%  99%    Intake/Output from previous day:  Intake/Output Summary (Last 24 hours) at 11/06/15 0734 Last data filed at 11/05/15 1824  Gross per 24 hour  Intake    660 ml  Output   1855 ml  Net  -1195 ml    Physical Exam: Affect appropriate Chronically ill white female  HEENT: normal Neck supple with no adenopathy JVP normal no bruits no thyromegaly right tunneled subclavian line  Lungs clear with no wheezing and good diaphragmatic motion Heart:  S1/S2 SEM  murmur, no rub, gallop or click PMI normal Abdomen: benighn, BS positve, no tenderness, no AAA no bruit.  No HSM or HJR Distal pulses intact  Non mature LUE fistula with thrill  No edema Neuro non-focal Skin warm and dry No muscular weakness   Lab Results: Basic Metabolic Panel:  Recent Labs  11/04/15 0515 11/05/15 0718  NA 138 136  K 4.9 4.1  CL 100* 96*  CO2 24 26  GLUCOSE 102* 145*  BUN 53* 30*  CREATININE 6.69* 4.88*  CALCIUM 9.0 8.7*  PHOS  --  3.1   Liver Function Tests:  Recent Labs  11/05/15 0718  ALBUMIN 2.2*   No results for input(s): LIPASE, AMYLASE in the last 72 hours. CBC:  Recent Labs  11/04/15 0515 11/05/15 0530  WBC 12.6* 13.5*  HGB 9.4* 10.6*  HCT 30.7* 34.7*  MCV 88.0 87.4  PLT 286 295   Thyroid Function Tests:  Recent Labs  11/05/15 1417  TSH 1.242    Imaging: Ir Nephrostomy Exchange Right  11/04/2015  CLINICAL DATA:  Obstructive right hydronephrosis, chronic indwelling right percutaneous nephrostomy catheter and double-J ureteral stent. Routine  exchange. EXAM: RIGHT PERCUTANEOUS NEPHROSTOMY CATHETER EXCHANGE UNDER FLUOROSCOPY FLUOROSCOPY TIME:  0.9 minutes, 79  uGym2 DAP TECHNIQUE: The nephrostomy tube and surrounding skin were prepped with Betadine, draped in usual sterile fashion. A small amount of contrast was injected through the right nephrostomy catheter to opacify the renal collecting system. The catheter was cut and exchanged over a 0.035" angiographic wire for a new 10-French DAWSON MUELLER pigtail catheter, formed centrally within the collecting system under fluoroscopy. Contrast injection confirms appropriate positioning. The catheter was secured externally with a Statlock device. The patient tolerated the procedure well. Complications: None. IMPRESSION: 1. Technically successful exchange of right nephrostomy catheter under fluoroscopy Electronically Signed   By: Lucrezia Europe M.D.   On: 11/04/2015 15:11    Cardiac Studies:  ECG: afib rate 137 nonspecific ST changes   Telemetry:  AFib rates 90-100  Echo: EF 55-60% no significant valve DX  Medications:   . cefTAZidime (FORTAZ)  IV  2 g Intravenous Q T,Th,Sa-HD  . darbepoetin (ARANESP) injection - DIALYSIS  60 mcg Intravenous Q Fri-HD  . famotidine  20 mg Oral Daily  . feeding supplement (NEPRO CARB STEADY)  237 mL Oral TID BM  . heparin  5,000 Units Subcutaneous Q8H  . latanoprost  1  drop Both Eyes QHS  . metoprolol tartrate  25 mg Oral BID  . saccharomyces boulardii  250 mg Oral BID  . timolol  1 drop Both Eyes q morning - 10a  . triamcinolone  1 application Topical BID     . amiodarone 30 mg/hr (11/05/15 2148)    Assessment/Plan:   PAF:  Start coumadin.  Continue heparin continue bid lopressor for rate control Dialysis: Tomorrow fistula not mature Access right tunnelled subclavian line UTI:  On Fortaz plan per primary service  Jenkins Rouge 11/06/2015, 7:34 AM

## 2015-11-06 NOTE — Progress Notes (Addendum)
PROGRESS NOTE    [name]  [id number] DOB: 15-Sep-1945 DOA: 10/30/2015 PCP: [name], MD  Outpatient Specialists: Dr. Florene Glen    Brief Narrative:  70 y.o. female with medical history significant of CKD stage V, nephrolithiasis, HTN, CAD, anemia of chronic disease, s/p right nephrostomy tube; who presented with complaints of 1 day history of confusion and fever. Patient noted to have  WBC of 20k with UA suggestive of UTI. Patient was started on IVF and antibiotics. She is followed by Dr. Florene Glen as outpatient for stage 5 CKD with fistula placed in the past few weeks, already has tunneled dialysis catheter. Presenting Cr noted to be 8.06 with BUN of 82, no significant improvement with IV fluids, seen by renal, started hemodialysis on 5/15, was transferred to stepdown 5/15 for an episode of chest pain post hemodialysis, with negative workup so far. Patient was about to be discharged on 5/18, then went into A. fib with RVR. Transferred to stepdown unit again and started on IV amiodarone drip. Cardiology consulted.   Assessment & Plan:   Principal Problem:   PAF (paroxysmal atrial fibrillation) (HCC) Active Problems:   Essential hypertension, benign   End stage renal disease (HCC)   Anemia of renal disease   CKD (chronic kidney disease) stage 5, GFR less than 15 ml/min (HCC)   Urinary tract infectious disease  Sepsis secondary to UTI related to nephrostomy tube. - Sepsis criteria with 100.87F temp , WBC of 20.7, and positive urinalysis. Previous history of Pseudomonas infection noted in the past. - Empiric vancomycin and cefepime started on admission, stopped IV vancomycin giving negative cultures - Urine culture growing Pseudomonas aeruginosa, cefepime > changed to ceftaz edema across HD. Blood cultures 2: Negative. - As discussed with nephrology, complete total 14 days course and then consider suppressive antibiotics. Outpatient follow-up with Dr. Era Bumpers, urology. -  Sepsis has resolved.  Chronic kidney disease stage V:  - Presenting Creatinine elevated at 8.06 with a BUN of 82, outpatient creatinine around 6 - Patient has left arm AVF, as well tunneled dialysis catheter. - Patient started hemodialysis on 11/02/2015-new to dialysis. - Management per renal. Sees Dr. Florene Glen as outpatient.  Chronic obstructive uropathy - Patient with right nephrostomy tube, changed 5/17 by IR. Outpatient follow-up with Dr. Era Bumpers.  Acute encephalopathy  - In the setting of infectious process, resolved,  Essential hypertension - Continued on metoprolol  Glaucoma  - continue home eyedrops   GERD - Cont H2 blocker  Anemia of chronic renal disease - Aranesp per renal  Chest pain - Muscular skeletal feature, troponins negative 3, EKG nonacute, with repeat EKG, and 2-D echo: LVEF 55-60 percent, no wall motion abnormalities and grade 1 diastolic dysfunction.  Leukocytosis - improving  Paroxysmal A. fib with RVR - Noted on telemetry 5/16 night. As per review of her cardiologist's note on 11/21/14: History of A. fib in the context of acute illness, taken off amiodarone, resolved cardiomyopathy.  - Patient returned from HD on 5/18 and noted to be in A. fib with RVR in the 130s. - Cardiology consultation and follow-up appreciated. Patient transferred to stepdown 5/18, started on IV amiodarone infusion, metoprolol dose increased from 12.5-25 MG bid. Rate better controlled. IV heparin bridging & Coumadin initiated 5/19. - TSH: Normal   DVT prophylaxis: IV heparin bridging and Coumadin Code Status: Full Family Communication: Discussed with patient. No family at bedside. Disposition Plan: DC home when medically stable.   Consultants:   Nephrology  Cardiology  Procedures:  HD  Antimicrobials:  IV cefepime 5/12 > 5/17  IV Fortaz across HD 5/18 >  IV vancomycin 5/12 > 5/14    Subjective: Denies complaints. States that during episode of rapid A.  fib on 5/18, just felt uneasy but denies palpitations, chest pain, dyspnea or lightheadedness.  Objective: Filed Vitals:   11/05/15 1857 11/05/15 2030 11/06/15 1000 11/06/15 1447  BP: 108/66 102/63 93/51 116/65  Pulse: 91 84  102  Temp:  97.7 F (36.5 C)  97.9 F (36.6 C)  TempSrc:  Oral  Oral  Resp:  16  18  Height:      Weight:      SpO2:  99%  98%    Intake/Output Summary (Last 24 hours) at 11/06/15 1508 Last data filed at 11/06/15 1448  Gross per 24 hour  Intake    600 ml  Output    450 ml  Net    150 ml   Filed Weights   11/04/15 2301 11/05/15 0700 11/05/15 1055  Weight: 79.4 kg (175 lb 0.7 oz) 78.6 kg (173 lb 4.5 oz) 77.8 kg (171 lb 8.3 oz)    Examination:  General exam: Pleasant elderly female lying comfortably in bed. Respiratory system: Clear to auscultation. Respiratory effort normal. Cardiovascular system: S1 & S2 heard, irregularly irregular. No JVD, murmurs. Telemetry: A. fib with ventricular rate in the 90s-100s.  Gastrointestinal system: Abdomen is nondistended, soft and nontender.  BS+, right nephrostomy tube site without acute findings Central nervous system: Alert and oriented. No focal neurological deficits. Extremities: Symmetric 5 x 5 power. Skin: No rashes, lesions Psychiatry: Judgement and insight appear normal. Mood & affect appropriate.     Data Reviewed: I have personally reviewed following labs and imaging studies  CBC:  Recent Labs Lab 10/31/15 0009 11/01/15 0608 11/02/15 0534 11/03/15 0543 11/04/15 0515 11/05/15 0530  WBC 20.7* 8.7 11.2* 16.4* 12.6* 13.5*  NEUTROABS 18.3*  --   --   --   --   --   HGB 9.7* 9.4* 9.2* 9.5* 9.4* 10.6*  HCT 30.2* 30.6* 30.0* 30.0* 30.7* 34.7*  MCV 83.9 87.9 87.0 87.2 88.0 87.4  PLT 385 344 355 245 286 [id number]   Basic Metabolic Panel:  Recent Labs Lab 11/01/15 0608 11/02/15 0534 11/03/15 0543 11/04/15 0515 11/05/15 0718  NA 138 139 139 138 136  K 5.0 4.8 4.6 4.9 4.1  CL 108 106 101 100* 96*    CO2 16* 20* 24 24 26   GLUCOSE 86 95 126* 102* 145*  BUN 73* 72* 48* 53* 30*  CREATININE 7.69* 7.87* 6.52* 6.69* 4.88*  CALCIUM 9.0 9.0 9.1 9.0 8.7*  PHOS 5.3* 5.0*  --   --  3.1   GFR: Estimated Creatinine Clearance: 10.6 mL/min (by C-G formula based on Cr of 4.88). Liver Function Tests:  Recent Labs Lab 11/01/15 0608 11/02/15 0534 11/05/15 0718  ALBUMIN 2.4* 2.4* 2.2*   No results for input(s): LIPASE, AMYLASE in the last 168 hours. No results for input(s): AMMONIA in the last 168 hours. Coagulation Profile: No results for input(s): INR, PROTIME in the last 168 hours. Cardiac Enzymes:  Recent Labs Lab 11/02/15 2010 11/03/15 0007 11/03/15 0543  TROPONINI <0.03 <0.03 0.03   BNP (last 3 results) No results for input(s): PROBNP in the last 8760 hours. HbA1C: No results for input(s): HGBA1C in the last 72 hours. CBG:  Recent Labs Lab 11/01/15 0227  GLUCAP 73   Lipid Profile: No results for input(s): CHOL, HDL, LDLCALC, TRIG, CHOLHDL, LDLDIRECT  in the last 72 hours. Thyroid Function Tests:  Recent Labs  11/05/15 1417  TSH 1.242   Anemia Panel: No results for input(s): VITAMINB12, FOLATE, FERRITIN, TIBC, IRON, RETICCTPCT in the last 72 hours. Urine analysis:    Component Value Date/Time   COLORURINE YELLOW 10/31/2015 0034   APPEARANCEUR TURBID* 10/31/2015 0034   LABSPEC 1.011 10/31/2015 0034   PHURINE 6.0 10/31/2015 0034   GLUCOSEU NEGATIVE 10/31/2015 0034   HGBUR LARGE* 10/31/2015 0034   BILIRUBINUR NEGATIVE 10/31/2015 0034   BILIRUBINUR neg 09/18/2014 0920   KETONESUR NEGATIVE 10/31/2015 0034   PROTEINUR 100* 10/31/2015 0034   PROTEINUR 2+ 09/18/2014 0920   UROBILINOGEN 0.2 05/02/2015 2020   UROBILINOGEN negative 09/18/2014 0920   NITRITE NEGATIVE 10/31/2015 0034   NITRITE neg 09/18/2014 0920   LEUKOCYTESUR LARGE* 10/31/2015 0034   Sepsis Labs:  Recent Labs Lab 10/31/15 0021  LATICACIDVEN 0.97    Recent Results (from the past 240 hour(s))   Culture, blood (routine x 2)     Status: None   Collection Time: 10/31/15 12:10 AM  Result Value Ref Range Status   Specimen Description BLOOD RIGHT FOREARM  Final   Special Requests BOTTLES DRAWN AEROBIC AND ANAEROBIC 5CC  Final   Culture   Final    NO GROWTH 5 DAYS Performed at [facility]    Report Status 11/05/2015 FINAL  Final  Urine culture     Status: Abnormal   Collection Time: 10/31/15 12:34 AM  Result Value Ref Range Status   Specimen Description URINE, RANDOM  Final   Special Requests NONE  Final   Culture >=100,000 COLONIES/mL PSEUDOMONAS AERUGINOSA (A)  Final   Report Status 11/03/2015 FINAL  Final   Organism ID, Bacteria PSEUDOMONAS AERUGINOSA (A)  Final      Susceptibility   Pseudomonas aeruginosa - MIC*    CEFTAZIDIME 4 SENSITIVE Sensitive     CIPROFLOXACIN 0.5 INTERMEDIATE Intermediate     GENTAMICIN 2 SENSITIVE Sensitive     IMIPENEM 1 SENSITIVE Sensitive     PIP/TAZO 16 SENSITIVE Sensitive     CEFEPIME 2 SENSITIVE Sensitive     * >=100,000 COLONIES/mL PSEUDOMONAS AERUGINOSA  Culture, blood (routine x 2)     Status: None   Collection Time: 10/31/15 12:56 AM  Result Value Ref Range Status   Specimen Description BLOOD RIGHT ARM  Final   Special Requests BOTTLES DRAWN AEROBIC AND ANAEROBIC 10CC  Final   Culture   Final    NO GROWTH 5 DAYS Performed at [facility]    Report Status 11/05/2015 FINAL  Final  MRSA PCR Screening     Status: None   Collection Time: 10/31/15  7:37 AM  Result Value Ref Range Status   MRSA by PCR NEGATIVE NEGATIVE Final    Comment:        The GeneXpert MRSA Assay (FDA approved for NASAL specimens only), is one component of a comprehensive MRSA colonization surveillance program. It is not intended to diagnose MRSA infection nor to guide or monitor treatment for MRSA infections.          Radiology Studies: No results found.      Scheduled Meds: . cefTAZidime (FORTAZ)  IV  2 g Intravenous Q  T,Th,Sa-HD  . [START ON 11/07/2015] darbepoetin (ARANESP) injection - DIALYSIS  60 mcg Intravenous Q Sat-HD  . famotidine  20 mg Oral Daily  . feeding supplement (NEPRO CARB STEADY)  237 mL Oral TID BM  . latanoprost  1 drop Both  Eyes QHS  . metoprolol tartrate  25 mg Oral BID  . saccharomyces boulardii  250 mg Oral BID  . timolol  1 drop Both Eyes q morning - 10a  . triamcinolone  1 application Topical BID  . warfarin  5 mg Oral ONCE-1800  . Warfarin - Pharmacist Dosing Inpatient   Does not apply q1800   Continuous Infusions: . amiodarone 30 mg/hr (11/06/15 1012)  . heparin       LOS: 6 days    [facility], MD [facility] Pager [phone]  If 7PM-7AM, please contact night-coverage [email or web address] Password [email or web address] 11/06/2015, 3:08 PM

## 2015-11-07 ENCOUNTER — Encounter (HOSPITAL_COMMUNITY): Payer: Self-pay | Admitting: Nephrology

## 2015-11-07 DIAGNOSIS — I4891 Unspecified atrial fibrillation: Secondary | ICD-10-CM

## 2015-11-07 LAB — CBC
HCT: 31.5 % — ABNORMAL LOW (ref 36.0–46.0)
HCT: 34 % — ABNORMAL LOW (ref 36.0–46.0)
Hemoglobin: 9.4 g/dL — ABNORMAL LOW (ref 12.0–15.0)
Hemoglobin: 10.2 g/dL — ABNORMAL LOW (ref 12.0–15.0)
MCH: 25.9 pg — ABNORMAL LOW (ref 26.0–34.0)
MCH: 25.8 pg — ABNORMAL LOW (ref 26.0–34.0)
MCHC: 29.8 g/dL — ABNORMAL LOW (ref 30.0–36.0)
MCHC: 30 g/dL (ref 30.0–36.0)
MCV: 86.8 fL (ref 78.0–100.0)
MCV: 85.9 fL (ref 78.0–100.0)
Platelets: 237 10*3/uL (ref 150–400)
Platelets: 152 10*3/uL (ref 150–400)
RBC: 3.63 MIL/uL — ABNORMAL LOW (ref 3.87–5.11)
RBC: 3.96 MIL/uL (ref 3.87–5.11)
RDW: 17.8 % — ABNORMAL HIGH (ref 11.5–15.5)
RDW: 17.3 % — ABNORMAL HIGH (ref 11.5–15.5)
WBC: 10.1 10*3/uL (ref 4.0–10.5)
WBC: 14.7 10*3/uL — ABNORMAL HIGH (ref 4.0–10.5)

## 2015-11-07 LAB — RENAL FUNCTION PANEL
Albumin: 2.2 g/dL — ABNORMAL LOW (ref 3.5–5.0)
Anion gap: 13 (ref 5–15)
BUN: 34 mg/dL — ABNORMAL HIGH (ref 6–20)
CO2: 25 mmol/L (ref 22–32)
Calcium: 8.8 mg/dL — ABNORMAL LOW (ref 8.9–10.3)
Chloride: 98 mmol/L — ABNORMAL LOW (ref 101–111)
Creatinine, Ser: 5.73 mg/dL — ABNORMAL HIGH (ref 0.44–1.00)
GFR calc Af Amer: 8 mL/min — ABNORMAL LOW (ref >60)
GFR calc non Af Amer: 7 mL/min — ABNORMAL LOW (ref >60)
Glucose, Bld: 93 mg/dL (ref 65–99)
Phosphorus: 3.6 mg/dL (ref 2.5–4.6)
Potassium: 4.1 mmol/L (ref 3.5–5.1)
Sodium: 136 mmol/L (ref 135–145)

## 2015-11-07 LAB — HEPARIN LEVEL (UNFRACTIONATED)
Heparin Unfractionated: 0.29 IU/mL — ABNORMAL LOW (ref 0.30–0.70)
Heparin Unfractionated: 0.39 IU/mL (ref 0.30–0.70)

## 2015-11-07 LAB — PROTIME-INR
INR: 1.21 (ref 0.00–1.49)
Prothrombin Time: 15.5 seconds — ABNORMAL HIGH (ref 11.6–15.2)

## 2015-11-07 MED ORDER — LIDOCAINE-PRILOCAINE 2.5-2.5 % EX CREA
1.0000 "application " | TOPICAL_CREAM | CUTANEOUS | Status: DC | PRN
  Filled 2015-11-07: qty 5

## 2015-11-07 MED ORDER — AMIODARONE HCL IN DEXTROSE 360-4.14 MG/200ML-% IV SOLN: 60.0000 mg/h | INTRAVENOUS | Status: DC

## 2015-11-07 MED ORDER — SODIUM CHLORIDE 0.9 % IV SOLN: 100.0000 mL | INTRAVENOUS | Status: DC | PRN

## 2015-11-07 MED ORDER — HEPARIN SODIUM (PORCINE) 1000 UNIT/ML DIALYSIS: 20.0000 [IU]/kg | INTRAMUSCULAR | Status: DC | PRN

## 2015-11-07 MED ORDER — DARBEPOETIN ALFA 60 MCG/0.3ML IJ SOSY
PREFILLED_SYRINGE | INTRAMUSCULAR | Status: AC
  Filled 2015-11-07: qty 0.3

## 2015-11-07 MED ORDER — WARFARIN SODIUM 5 MG PO TABS
5.0000 mg | ORAL_TABLET | Freq: Once | ORAL | Status: AC
  Administered 2015-11-07: 5 mg via ORAL
  Filled 2015-11-07: qty 1

## 2015-11-07 MED ORDER — METOPROLOL TARTRATE 25 MG PO TABS
25.0000 mg | ORAL_TABLET | Freq: Three times a day (TID) | ORAL | Status: DC
  Administered 2015-11-07 – 2015-11-08 (×3): 25 mg via ORAL
  Filled 2015-11-07 (×4): qty 1

## 2015-11-07 MED ORDER — ALTEPLASE 2 MG IJ SOLR
2.0000 mg | Freq: Once | INTRAMUSCULAR | Status: DC | PRN
Start: 2015-11-07 — End: 2015-11-10

## 2015-11-07 MED ORDER — AMIODARONE HCL IN DEXTROSE 360-4.14 MG/200ML-% IV SOLN: 30.0000 mg/h | INTRAVENOUS | Status: DC

## 2015-11-07 MED ORDER — LIDOCAINE HCL (PF) 1 % IJ SOLN: 5.0000 mL | INTRAMUSCULAR | Status: DC | PRN

## 2015-11-07 MED ORDER — PENTAFLUOROPROP-TETRAFLUOROETH EX AERO: 1.0000 "application " | INHALATION_SPRAY | CUTANEOUS | Status: DC | PRN

## 2015-11-07 MED ORDER — HEPARIN SODIUM (PORCINE) 1000 UNIT/ML DIALYSIS: 1000.0000 [IU] | INTRAMUSCULAR | Status: DC | PRN

## 2015-11-07 NOTE — Progress Notes (Signed)
Patient ID: [name], female   DOB: [birth year], 70 y.o.   MRN: [id number]    Subjective:  Feels better for dialysis today   Objective:  Filed Vitals:   11/06/15 1000 11/06/15 1447 11/06/15 2015 11/06/15 2246  BP: 93/51 116/65 143/58 110/61  Pulse:  102 108   Temp:  97.9 F (36.6 C) 97.6 F (36.4 C)   TempSrc:  Oral Oral   Resp:  18 18   Height:      Weight:      SpO2:  98% 100%     Intake/Output from previous day:  Intake/Output Summary (Last 24 hours) at 11/07/15 [id number] Last data filed at 11/06/15 1448  Gross per 24 hour  Intake    240 ml  Output    400 ml  Net   -160 ml    Physical Exam: Affect appropriate Chronically ill white female  HEENT: normal Neck supple with no adenopathy JVP normal no bruits no thyromegaly right tunneled subclavian line  Lungs clear with no wheezing and good diaphragmatic motion Heart:  S1/S2 SEM  murmur, no rub, gallop or click PMI normal Abdomen: benighn, BS positve, no tenderness, no AAA no bruit.  No HSM or HJR Distal pulses intact  Non mature LUE fistula with thrill  No edema Neuro non-focal Skin warm and dry No muscular weakness   Lab Results: Basic Metabolic Panel:  Recent Labs  11/05/15 0718  NA 136  K 4.1  CL 96*  CO2 26  GLUCOSE 145*  BUN 30*  CREATININE 4.88*  CALCIUM 8.7*  PHOS 3.1   Liver Function Tests:  Recent Labs  11/05/15 0718  ALBUMIN 2.2*   CBC:  Recent Labs  11/05/15 0530 11/07/15 0322  WBC 13.5* 10.1  HGB 10.6* 9.4*  HCT 34.7* 31.5*  MCV 87.4 86.8  PLT 295 237   Thyroid Function Tests:  Recent Labs  11/05/15 1417  TSH 1.242    Imaging: No results found.  Cardiac Studies:  ECG: afib rate 137 nonspecific ST changes   Telemetry:  AFib rates 90-100  Echo: EF 55-60% no significant valve DX  Medications:   . cefTAZidime (FORTAZ)  IV  2 g Intravenous Q T,Th,Sa-HD  . darbepoetin (ARANESP) injection - DIALYSIS  60 mcg Intravenous Q Sat-HD  . famotidine  20 mg Oral Daily   . feeding supplement (NEPRO CARB STEADY)  237 mL Oral TID BM  . latanoprost  1 drop Both Eyes QHS  . metoprolol tartrate  25 mg Oral BID  . saccharomyces boulardii  250 mg Oral BID  . timolol  1 drop Both Eyes q morning - 10a  . triamcinolone  1 application Topical BID  . Warfarin - Pharmacist Dosing Inpatient   Does not apply q1800     . amiodarone 30 mg/hr (11/07/15 0029)  . heparin 1,350 Units/hr (11/07/15 0640)    Assessment/Plan:   PAF:  Has not converted now 4 days in will d/c amiodarone and increase lopressor to tid. Consider [place] 4 weeks Stop heparin when INR Rx.  Will arrange outpatient cardiac f/u  Dialysis: Today  fistula not mature Access right tunnelled subclavian line UTI:  On Fortaz plan per primary service  Jenkins Rouge 11/07/2015, 7:33 AM

## 2015-11-07 NOTE — Progress Notes (Signed)
Admit: 10/30/2015 LOS: 7  54F CKD5, chronic R PCN, admit with pseudomonal UTI, new start HD as ESRD  Subjective:  In HD Persistent AFib  05/19 0701 - 05/20 0700 In: 240 [P.O.:240] Out: 400 [Urine:400]  Filed Weights   11/04/15 2301 11/05/15 0700 11/05/15 1055  Weight: 79.4 kg (175 lb 0.7 oz) 78.6 kg (173 lb 4.5 oz) 77.8 kg (171 lb 8.3 oz)    Scheduled Meds: . cefTAZidime (FORTAZ)  IV  2 g Intravenous Q T,Th,Sa-HD  . darbepoetin (ARANESP) injection - DIALYSIS  60 mcg Intravenous Q Sat-HD  . famotidine  20 mg Oral Daily  . feeding supplement (NEPRO CARB STEADY)  237 mL Oral TID BM  . latanoprost  1 drop Both Eyes QHS  . metoprolol tartrate  25 mg Oral TID  . saccharomyces boulardii  250 mg Oral BID  . timolol  1 drop Both Eyes q morning - 10a  . triamcinolone  1 application Topical BID  . Warfarin - Pharmacist Dosing Inpatient   Does not apply q1800   Continuous Infusions: . heparin 1,350 Units/hr (11/07/15 0640)   PRN Meds:.acetaminophen **OR** acetaminophen, albuterol, alum & mag hydroxide-simeth, feeding supplement (NEPRO CARB STEADY), guaiFENesin-dextromethorphan, metoprolol, morphine injection, nitroGLYCERIN, nystatin cream, ondansetron **OR** ondansetron (ZOFRAN) IV, sodium chloride, traMADol  Current Labs: reviewed  B Cx 5/13x2: NGTD  Physical Exam:  Blood pressure 110/61, pulse 108, temperature 97.6 F (36.4 C), temperature source Oral, resp. rate 18, height 5\' 3"  (1.6 m), weight 77.8 kg (171 lb 8.3 oz), last menstrual period 01/19/1992, SpO2 100 %. NAD, RRR, no rub CTAB No edema R IJ TDC Maturing BVT +B/T in LUE  A 1. New ESRD 1. Tunneled HD Cath present prior to admission 2. Maturing AVF 3. CLIP to [facility] complete on THS Schedule 4. EDW of 77.5kg seems reasonable 2. Pseudomonal UTI on with PCN Present on ceftazidime 3. Chronic PCN 4. CP episode 5/15 at end of HD and thereafter, troponin negative 5. AMS resolved 6. AFib with RVR awaiting therapeutic  INR 7. Anemia on ESA 8. CKD-BMD: Phos ok  P 1. HD today 2. Check PTH   Pearson Grippe MD 11/07/2015, 11:21 AM   Recent Labs Lab 11/01/15 [id number] 11/02/15 0534 11/03/15 0543 11/04/15 0515 11/05/15 0718  NA 138 139 139 138 136  K 5.0 4.8 4.6 4.9 4.1  CL 108 106 101 100* 96*  CO2 16* 20* 24 24 26   GLUCOSE 86 95 126* 102* 145*  BUN 73* 72* 48* 53* 30*  CREATININE 7.69* 7.87* 6.52* 6.69* 4.88*  CALCIUM 9.0 9.0 9.1 9.0 8.7*  PHOS 5.3* 5.0*  --   --  3.1    Recent Labs Lab 11/04/15 0515 11/05/15 0530 11/07/15 0322  WBC 12.6* 13.5* 10.1  HGB 9.4* 10.6* 9.4*  HCT 30.7* 34.7* 31.5*  MCV 88.0 87.4 86.8  PLT 286 295 237

## 2015-11-07 NOTE — Progress Notes (Signed)
PROGRESS NOTE    [name]  [id number] DOB: 22-Dec-1945 DOA: 10/30/2015 PCP: [name], MD  Outpatient Specialists: Dr. Florene Glen    Brief Narrative:  70 y.o. female with medical history significant of CKD stage V, nephrolithiasis, HTN, CAD, anemia of chronic disease, s/p right nephrostomy tube; who presented with complaints of 1 day history of confusion and fever. Patient noted to have  WBC of 20k with UA suggestive of UTI. Patient was started on IVF and antibiotics. She is followed by Dr. Florene Glen as outpatient for stage 5 CKD with fistula placed in the past few weeks, already has tunneled dialysis catheter. Presenting Cr noted to be 8.06 with BUN of 82, no significant improvement with IV fluids, seen by renal, started hemodialysis on 5/15, was transferred to stepdown 5/15 for an episode of chest pain post hemodialysis, with negative workup so far. Patient was about to be discharged on 5/18, then went into A. fib with RVR. Transferred to stepdown unit again and started on IV amiodarone drip. Cardiology consulted.   Assessment & Plan:   Principal Problem:   Atrial fibrillation with RVR (HCC) Active Problems:   Essential hypertension, benign   End stage renal disease (HCC)   Anemia of renal disease   CKD (chronic kidney disease) stage 5, GFR less than 15 ml/min (HCC)   Urinary tract infectious disease   PAF (paroxysmal atrial fibrillation) ([place])  Sepsis secondary to UTI related to nephrostomy tube. - Sepsis criteria with 100.45F temp , WBC of 20.7, and positive urinalysis. Previous history of Pseudomonas infection noted in the past. - Empiric vancomycin and cefepime started on admission, stopped IV vancomycin giving negative cultures - Urine culture growing Pseudomonas aeruginosa, cefepime > changed to ceftaz edema across HD. Blood cultures 2: Negative. - As discussed with nephrology, complete total 14 days course and then consider suppressive antibiotics. Outpatient  follow-up with Dr. Era Bumpers, urology. - Sepsis has resolved.  Chronic kidney disease stage V:  - Presenting Creatinine elevated at 8.06 with a BUN of 82, outpatient creatinine around 6 - Patient has left arm AVF, as well tunneled dialysis catheter. - Patient started hemodialysis on 11/02/2015-new to dialysis. - Management per renal. Sees Dr. Florene Glen as outpatient.  Chronic obstructive uropathy - Patient with right nephrostomy tube, changed 5/17 by IR. Outpatient follow-up with Dr. Era Bumpers.  Acute encephalopathy  - In the setting of infectious process, resolved,  Essential hypertension - Continued on metoprolol  Glaucoma  - continue home eyedrops   GERD - Cont H2 blocker  Anemia of chronic renal disease - Aranesp per renal  Chest pain - Muscular skeletal feature, troponins negative 3, EKG nonacute, with repeat EKG, and 2-D echo: LVEF 55-60 percent, no wall motion abnormalities and grade 1 diastolic dysfunction.  Leukocytosis - improving  Paroxysmal A. fib with RVR - Noted on telemetry 5/16 night. As per review of her cardiologist's note on 11/21/14: History of A. fib in the context of acute illness, taken off amiodarone, resolved cardiomyopathy.  - Patient returned from HD on 5/18 and noted to be in A. fib with RVR in the 130s. - Cardiology following. Patient transferred to stepdown 5/18, started on IV amiodarone infusion, metoprolol dose increased from 12.5-25 MG bid. Rate better controlled. IV heparin bridging & Coumadin initiated 5/19. - TSH: Normal - Despite being on IV amiodarone for a couple days, remained in A. fib. Cardiology discontinued amiodarone on 5/20. Increased metoprolol to 25 MG 3 times a day and plan assessment for cardioversion in one month.  DVT prophylaxis: IV heparin bridging and Coumadin Code Status: Full Family Communication: Discussed with patient. No family at bedside. Disposition Plan: DC home when medically stable & INR  therapeutic.   Consultants:   Nephrology  Cardiology  Procedures:   HD  Antimicrobials:  IV cefepime 5/12 > 5/17  IV Fortaz across HD 5/18 >  IV vancomycin 5/12 > 5/14    Subjective: Denies complaints. No palpitations, chest pain or dyspnea. States that she ambulated to the bathroom this morning.  Objective: Filed Vitals:   11/07/15 1230 11/07/15 1300 11/07/15 1330 11/07/15 1359  BP: 109/71 63/43 99/59  108/67  Pulse: 110 92 94 104  Temp:    98.6 F (37 C)  TempSrc:    Oral  Resp:    18  Height:      Weight:    78.7 kg (173 lb 8 oz)  SpO2:    98%    Intake/Output Summary (Last 24 hours) at 11/07/15 1602 Last data filed at 11/07/15 1359  Gross per 24 hour  Intake      0 ml  Output    209 ml  Net   -209 ml   Filed Weights   11/05/15 1055 11/07/15 1045 11/07/15 1359  Weight: 77.8 kg (171 lb 8.3 oz) 78.7 kg (173 lb 8 oz) 78.7 kg (173 lb 8 oz)    Examination:  General exam: Pleasant elderly female lying comfortably in bed. Respiratory system: Clear to auscultation. Respiratory effort normal. Cardiovascular system: S1 & S2 heard, irregularly irregular. No JVD, murmurs. Telemetry: A. fib with ventricular rate in the 90s-100s.  Gastrointestinal system: Abdomen is nondistended, soft and nontender.  BS+, right nephrostomy tube site without acute findings Central nervous system: Alert and oriented. No focal neurological deficits. Extremities: Symmetric 5 x 5 power. Skin: No rashes, lesions Psychiatry: Judgement and insight appear normal. Mood & affect appropriate.     Data Reviewed: I have personally reviewed following labs and imaging studies  CBC:  Recent Labs Lab 11/02/15 0534 11/03/15 0543 11/04/15 0515 11/05/15 0530 11/07/15 0322  WBC 11.2* 16.4* 12.6* 13.5* 10.1  HGB 9.2* 9.5* 9.4* 10.6* 9.4*  HCT 30.0* 30.0* 30.7* 34.7* 31.5*  MCV 87.0 87.2 88.0 87.4 86.8  PLT 355 245 286 295 [id number]   Basic Metabolic Panel:  Recent Labs Lab 11/01/15 0608  11/02/15 0534 11/03/15 0543 11/04/15 0515 11/05/15 0718 11/07/15 1107  NA 138 139 139 138 136 136  K 5.0 4.8 4.6 4.9 4.1 4.1  CL 108 106 101 100* 96* 98*  CO2 16* 20* 24 24 26 25   GLUCOSE 86 95 126* 102* 145* 93  BUN 73* 72* 48* 53* 30* 34*  CREATININE 7.69* 7.87* 6.52* 6.69* 4.88* 5.73*  CALCIUM 9.0 9.0 9.1 9.0 8.7* 8.8*  PHOS 5.3* 5.0*  --   --  3.1 3.6   GFR: Estimated Creatinine Clearance: 9.1 mL/min (by C-G formula based on Cr of 5.73). Liver Function Tests:  Recent Labs Lab 11/01/15 0608 11/02/15 0534 11/05/15 0718 11/07/15 1107  ALBUMIN 2.4* 2.4* 2.2* 2.2*   No results for input(s): LIPASE, AMYLASE in the last 168 hours. No results for input(s): AMMONIA in the last 168 hours. Coagulation Profile:  Recent Labs Lab 11/07/15 0322  INR 1.21   Cardiac Enzymes:  Recent Labs Lab 11/02/15 2010 11/03/15 0007 11/03/15 0543  TROPONINI <0.03 <0.03 0.03   BNP (last 3 results) No results for input(s): PROBNP in the last 8760 hours. HbA1C: No results for input(s): HGBA1C in the last  72 hours. CBG:  Recent Labs Lab 11/01/15 0227  GLUCAP 73   Lipid Profile: No results for input(s): CHOL, HDL, LDLCALC, TRIG, CHOLHDL, LDLDIRECT in the last 72 hours. Thyroid Function Tests:  Recent Labs  11/05/15 1417  TSH 1.242   Anemia Panel: No results for input(s): VITAMINB12, FOLATE, FERRITIN, TIBC, IRON, RETICCTPCT in the last 72 hours. Urine analysis:    Component Value Date/Time   COLORURINE YELLOW 10/31/2015 0034   APPEARANCEUR TURBID* 10/31/2015 0034   LABSPEC 1.011 10/31/2015 0034   PHURINE 6.0 10/31/2015 0034   GLUCOSEU NEGATIVE 10/31/2015 0034   HGBUR LARGE* 10/31/2015 0034   BILIRUBINUR NEGATIVE 10/31/2015 0034   BILIRUBINUR neg 09/18/2014 0920   KETONESUR NEGATIVE 10/31/2015 0034   PROTEINUR 100* 10/31/2015 0034   PROTEINUR 2+ 09/18/2014 0920   UROBILINOGEN 0.2 05/02/2015 2020   UROBILINOGEN negative 09/18/2014 0920   NITRITE NEGATIVE 10/31/2015  0034   NITRITE neg 09/18/2014 0920   LEUKOCYTESUR LARGE* 10/31/2015 0034   Sepsis Labs: No results for input(s): PROCALCITON, LATICACIDVEN in the last 168 hours.  Recent Results (from the past 240 hour(s))  Culture, blood (routine x 2)     Status: None   Collection Time: 10/31/15 12:10 AM  Result Value Ref Range Status   Specimen Description BLOOD RIGHT FOREARM  Final   Special Requests BOTTLES DRAWN AEROBIC AND ANAEROBIC 5CC  Final   Culture   Final    NO GROWTH 5 DAYS Performed at [facility]    Report Status 11/05/2015 FINAL  Final  Urine culture     Status: Abnormal   Collection Time: 10/31/15 12:34 AM  Result Value Ref Range Status   Specimen Description URINE, RANDOM  Final   Special Requests NONE  Final   Culture >=100,000 COLONIES/mL PSEUDOMONAS AERUGINOSA (A)  Final   Report Status 11/03/2015 FINAL  Final   Organism ID, Bacteria PSEUDOMONAS AERUGINOSA (A)  Final      Susceptibility   Pseudomonas aeruginosa - MIC*    CEFTAZIDIME 4 SENSITIVE Sensitive     CIPROFLOXACIN 0.5 INTERMEDIATE Intermediate     GENTAMICIN 2 SENSITIVE Sensitive     IMIPENEM 1 SENSITIVE Sensitive     PIP/TAZO 16 SENSITIVE Sensitive     CEFEPIME 2 SENSITIVE Sensitive     * >=100,000 COLONIES/mL PSEUDOMONAS AERUGINOSA  Culture, blood (routine x 2)     Status: None   Collection Time: 10/31/15 12:56 AM  Result Value Ref Range Status   Specimen Description BLOOD RIGHT ARM  Final   Special Requests BOTTLES DRAWN AEROBIC AND ANAEROBIC 10CC  Final   Culture   Final    NO GROWTH 5 DAYS Performed at [facility]    Report Status 11/05/2015 FINAL  Final  MRSA PCR Screening     Status: None   Collection Time: 10/31/15  7:37 AM  Result Value Ref Range Status   MRSA by PCR NEGATIVE NEGATIVE Final    Comment:        The GeneXpert MRSA Assay (FDA approved for NASAL specimens only), is one component of a comprehensive MRSA colonization surveillance program. It is not intended to  diagnose MRSA infection nor to guide or monitor treatment for MRSA infections.          Radiology Studies: No results found.      Scheduled Meds: . cefTAZidime (FORTAZ)  IV  2 g Intravenous Q T,Th,Sa-HD  . darbepoetin (ARANESP) injection - DIALYSIS  60 mcg Intravenous Q Sat-HD  . famotidine  20  mg Oral Daily  . feeding supplement (NEPRO CARB STEADY)  237 mL Oral TID BM  . latanoprost  1 drop Both Eyes QHS  . metoprolol tartrate  25 mg Oral TID  . saccharomyces boulardii  250 mg Oral BID  . timolol  1 drop Both Eyes q morning - 10a  . triamcinolone  1 application Topical BID  . warfarin  5 mg Oral ONCE-1800  . Warfarin - Pharmacist Dosing Inpatient   Does not apply q1800   Continuous Infusions: . heparin 1,350 Units/hr (11/07/15 0640)     LOS: 7 days    [facility], MD [facility] Pager [phone]  If 7PM-7AM, please contact night-coverage [email or web address] Password [email or web address] 11/07/2015, 4:02 PM

## 2015-11-07 NOTE — Progress Notes (Addendum)
ANTICOAGULATION CONSULT NOTE - Follow Up Consult  Pharmacy Consult for heparin/warfarin Indication: atrial fibrillation  Allergies  Allergen Reactions  . Vicodin [Hydrocodone-Acetaminophen] Nausea And Vomiting  . Chlorhexidine Rash    Sunburn    rash  . Percocet [Oxycodone-Acetaminophen] Nausea And Vomiting    Patient Measurements: Height: 5\' 3"  (160 cm) Weight: 173 lb 8 oz (78.7 kg) (standing) IBW/kg (Calculated) : 52.4 Heparin Dosing Weight: 70kg  Vital Signs: Temp: 97.6 F (36.4 C) (05/20 1045) Temp Source: Oral (05/20 1045) BP: 99/59 mmHg (05/20 1330) Pulse Rate: 94 (05/20 1330)  Labs:  Recent Labs  11/05/15 0530 11/05/15 0718 11/06/15 2201 11/07/15 0322 11/07/15 1107  HGB 10.6*  --   --  9.4*  --   HCT 34.7*  --   --  31.5*  --   PLT 295  --   --  237  --   LABPROT  --   --   --  15.5*  --   INR  --   --   --  1.21  --   HEPARINUNFRC  --   --  0.20* 0.29*  --   CREATININE  --  4.88*  --   --  5.73*    Estimated Creatinine Clearance: 9.1 mL/min (by C-G formula based on Cr of 5.73).  Assessment:  34 YOF, new ESRD on HD patient, with AFib with RVR.Pharmacy consulted to start warfarin and heparin IV.  Goal of Therapy:  INR 2-3, HL 0.3 -0.7 Monitor platelets by anticoagulation protocol: Yes   Plan:  Warfarin po 5 mg x 1 tonight Continue heparin >> checking 8 hr HL Daily HL, CBC, INR Monitor for S&S of bleed  Angela Burke, PharmD Pharmacy Resident Pager: [phone] 11/07/2015,1:55 PM  Addendum: Heparin level is now therapeutic, continue current dose and confirm dosing with AM labs.  Salome Arnt, PharmD, BCPS Pager # [phone] 11/07/2015 5:07 PM

## 2015-11-07 NOTE — Progress Notes (Signed)
[place] for heparin  Indication: atrial fibrillation  Allergies  Allergen Reactions  . Vicodin [Hydrocodone-Acetaminophen] Nausea And Vomiting  . Chlorhexidine Rash    Sunburn    rash  . Percocet [Oxycodone-Acetaminophen] Nausea And Vomiting    Patient Measurements: Height: 5\' 3"  (160 cm) Weight: 171 lb 8.3 oz (77.8 kg) IBW/kg (Calculated) : 52.4 Heparin Dosing Weight: 70kg  Vital Signs: Temp: 97.6 F (36.4 C) (05/19 2015) Temp Source: Oral (05/19 2015) BP: 110/61 mmHg (05/19 2246) Pulse Rate: 108 (05/19 2015)  Labs:  Recent Labs  11/05/15 0530 11/05/15 0718 11/06/15 2201 11/07/15 0322  HGB 10.6*  --   --  9.4*  HCT 34.7*  --   --  31.5*  PLT 295  --   --  237  LABPROT  --   --   --  15.5*  INR  --   --   --  1.21  HEPARINUNFRC  --   --  0.20* 0.29*  CREATININE  --  4.88*  --   --     Estimated Creatinine Clearance: 10.6 mL/min (by C-G formula based on Cr of 4.88).   Assessment: 69 YOF on heparin bridge to coumadin. Heparin level remains slightly subtherapeutic on 1200 units/hr. Hgb and plt down some. No issues with line or bleeding reported per RN.  Goal of Therapy:  INR 2-3 Heparin level 0.3-0.7 units/ml Monitor platelets by anticoagulation protocol: Yes   Plan:  Increase heparin to 1350 units/hr  Will f/u 8hr heparin level  Sherlon Handing, PharmD, BCPS Clinical pharmacist, pager [phone] 11/07/2015 5:58 AM

## 2015-11-08 LAB — CBC
HCT: 31.6 % — ABNORMAL LOW (ref 36.0–46.0)
Hemoglobin: 9.4 g/dL — ABNORMAL LOW (ref 12.0–15.0)
MCH: 26 pg (ref 26.0–34.0)
MCHC: 29.7 g/dL — ABNORMAL LOW (ref 30.0–36.0)
MCV: 87.3 fL (ref 78.0–100.0)
Platelets: 159 10*3/uL (ref 150–400)
RBC: 3.62 MIL/uL — ABNORMAL LOW (ref 3.87–5.11)
RDW: 17.7 % — ABNORMAL HIGH (ref 11.5–15.5)
WBC: 9 10*3/uL (ref 4.0–10.5)

## 2015-11-08 LAB — PROTIME-INR
INR: 1.3 (ref 0.00–1.49)
Prothrombin Time: 16.4 seconds — ABNORMAL HIGH (ref 11.6–15.2)

## 2015-11-08 LAB — HEPARIN LEVEL (UNFRACTIONATED): Heparin Unfractionated: 0.4 IU/mL (ref 0.30–0.70)

## 2015-11-08 MED ORDER — METOPROLOL TARTRATE 25 MG PO TABS
25.0000 mg | ORAL_TABLET | ORAL | Status: DC
  Administered 2015-11-08 – 2015-11-11 (×6): 25 mg via ORAL
  Filled 2015-11-08 (×5): qty 1

## 2015-11-08 MED ORDER — WARFARIN SODIUM 7.5 MG PO TABS
7.5000 mg | ORAL_TABLET | Freq: Once | ORAL | Status: AC
  Administered 2015-11-08: 7.5 mg via ORAL
  Filled 2015-11-08: qty 1

## 2015-11-08 MED ORDER — COUMADIN BOOK
Freq: Once | Status: AC
  Administered 2015-11-08: 14:00:00
  Filled 2015-11-08: qty 1

## 2015-11-08 MED ORDER — WARFARIN VIDEO
Freq: Once | Status: AC
  Administered 2015-11-08: 14:00:00

## 2015-11-08 MED ORDER — METOPROLOL TARTRATE 25 MG PO TABS
25.0000 mg | ORAL_TABLET | ORAL | Status: DC
  Administered 2015-11-10 (×2): 25 mg via ORAL
  Filled 2015-11-08 (×3): qty 1

## 2015-11-08 NOTE — Progress Notes (Signed)
Admit: 10/30/2015 LOS: 8  30F CKD5, chronic R PCN, admit with pseudomonal UTI, new start HD as ESRD  Subjective:  Tol HD yesterday: Tx #4, UF only 0.2L 2/2 IDH -- post weight 78.7kg Persistent AFib  05/20 0701 - 05/21 0700 In: -  Out: 209   Filed Weights   11/05/15 1055 11/07/15 1045 11/07/15 1359  Weight: 77.8 kg (171 lb 8.3 oz) 78.7 kg (173 lb 8 oz) 78.7 kg (173 lb 8 oz)    Scheduled Meds: . cefTAZidime (FORTAZ)  IV  2 g Intravenous Q T,Th,Sa-HD  . darbepoetin (ARANESP) injection - DIALYSIS  60 mcg Intravenous Q Sat-HD  . famotidine  20 mg Oral Daily  . feeding supplement (NEPRO CARB STEADY)  237 mL Oral TID BM  . latanoprost  1 drop Both Eyes QHS  . metoprolol tartrate  25 mg Oral TID  . saccharomyces boulardii  250 mg Oral BID  . timolol  1 drop Both Eyes q morning - 10a  . triamcinolone  1 application Topical BID  . Warfarin - Pharmacist Dosing Inpatient   Does not apply q1800   Continuous Infusions: . heparin 1,350 Units/hr (11/07/15 1618)   PRN Meds:.sodium chloride, sodium chloride, acetaminophen **OR** acetaminophen, albuterol, alteplase, alum & mag hydroxide-simeth, feeding supplement (NEPRO CARB STEADY), guaiFENesin-dextromethorphan, heparin, heparin, heparin, lidocaine (PF), lidocaine-prilocaine, metoprolol, morphine injection, nitroGLYCERIN, nystatin cream, ondansetron **OR** ondansetron (ZOFRAN) IV, pentafluoroprop-tetrafluoroeth, sodium chloride, traMADol  Current Labs: reviewed  B Cx 5/13x2: NGTD  Physical Exam:  Blood pressure 104/50, pulse 79, temperature 98.2 F (36.8 C), temperature source Oral, resp. rate 18, height _0  (1.6 m), weight 78.7 kg (173 lb 8 oz), last menstrual period 01/19/1992, SpO2 99 %. NAD, RRR, no rub CTAB No edema R IJ TDC Maturing BVT +B/T in LUE  A 1. New ESRD 1. Tunneled HD Cath present prior to admission 2. Maturing AVF 3. CLIP to [facility] complete on THS Schedule 4. EDW of 78.5kg seems reasonable 2. Pseudomonal UTI with PCN  pesent on ceftazidime 3. Chronic PCN 4. CP episode 5/15 at end of HD and thereafter, troponin negative 5. AMS resolved 6. AFib with RVR awaiting therapeutic INR 7. Anemia on ESA 8. CKD-BMD: Phos ok  P 1. HD On THS schedule 2. Await therapeutic INR  Pearson Grippe MD 11/08/2015, 10:55 AM   Recent Labs Lab 11/02/15 0534  11/04/15 0515 11/05/15 0718 11/07/15 1107  NA 139  < > 138 136 136  K 4.8  < > 4.9 4.1 4.1  CL 106  < > 100* 96* 98*  CO2 20*  < > _1 GLUCOSE 95  < > 102* 145* 93  BUN 72*  < > 53* 30* 34*  CREATININE 7.87*  < > 6.69* 4.88* 5.73*  CALCIUM 9.0  < > 9.0 8.7* 8.8*  PHOS 5.0*  --   --  3.1 3.6  < > = values in this interval not displayed.  Recent Labs Lab 11/07/15 0322 11/07/15 1528 11/08/15 0324  WBC 10.1 14.7* 9.0  HGB 9.4* 10.2* 9.4*  HCT 31.5* 34.0* 31.6*  MCV 86.8 85.9 87.3  PLT 237 152 159

## 2015-11-08 NOTE — Discharge Instructions (Signed)

## 2015-11-08 NOTE — Progress Notes (Signed)
Patient ID: [name], female   DOB: 02/18/1946, 70 y.o.   MRN: [id number]    Subjective:  Good dialysis yesterday   Objective:  Filed Vitals:   11/07/15 1617 11/07/15 2033 11/07/15 2215 11/08/15 0450  BP: 130/56 115/69 107/52 104/50  Pulse: 130 93 81 79  Temp:  98.2 F (36.8 C)  98.2 F (36.8 C)  TempSrc:  Oral  Oral  Resp:  18  18  Height:      Weight:      SpO2:  100%  99%    Intake/Output from previous day:  Intake/Output Summary (Last 24 hours) at 11/08/15 0756 Last data filed at 11/07/15 1359  Gross per 24 hour  Intake      0 ml  Output    209 ml  Net   -209 ml    Physical Exam: Affect appropriate Chronically ill white female  HEENT: normal Neck supple with no adenopathy JVP normal no bruits no thyromegaly right tunneled subclavian line  Lungs clear with no wheezing and good diaphragmatic motion Heart:  S1/S2 SEM  murmur, no rub, gallop or click PMI normal Abdomen: benighn, BS positve, no tenderness, no AAA no bruit.  No HSM or HJR Distal pulses intact  Non mature LUE fistula with thrill  No edema Neuro non-focal Skin warm and dry No muscular weakness   Lab Results: Basic Metabolic Panel:  Recent Labs  11/07/15 1107  NA 136  K 4.1  CL 98*  CO2 25  GLUCOSE 93  BUN 34*  CREATININE 5.73*  CALCIUM 8.8*  PHOS 3.6   Liver Function Tests:  Recent Labs  11/07/15 1107  ALBUMIN 2.2*   CBC:  Recent Labs  11/07/15 1528 11/08/15 0324  WBC 14.7* 9.0  HGB 10.2* 9.4*  HCT 34.0* 31.6*  MCV 85.9 87.3  PLT 152 159   Thyroid Function Tests:  Recent Labs  11/05/15 1417  TSH 1.242    Imaging: No results found.  Cardiac Studies:  ECG: afib rate 137 nonspecific ST changes   Telemetry:  AFib rates 90-100  Echo: EF 55-60% no significant valve DX  Medications:   . cefTAZidime (FORTAZ)  IV  2 g Intravenous Q T,Th,Sa-HD  . darbepoetin (ARANESP) injection - DIALYSIS  60 mcg Intravenous Q Sat-HD  . famotidine  20 mg Oral Daily  .  feeding supplement (NEPRO CARB STEADY)  237 mL Oral TID BM  . latanoprost  1 drop Both Eyes QHS  . metoprolol tartrate  25 mg Oral TID  . saccharomyces boulardii  250 mg Oral BID  . timolol  1 drop Both Eyes q morning - 10a  . triamcinolone  1 application Topical BID  . Warfarin - Pharmacist Dosing Inpatient   Does not apply q1800     . heparin 1,350 Units/hr (11/07/15 1618)    Assessment/Plan:   PAF:  Has not converted now 5 days in will amiodarone d/c Lopressor tid  Consider [place] 4 weeks Stop heparin when INR Rx.  Will arrange outpatient cardiac f/u  Dialysis: Yesterday   fistula not mature Access right tunnelled subclavian line UTI:  On Fortaz plan per primary service ? Change to PO  D/C when INR Rx   Lab Results  Component Value Date   INR 1.30 11/08/2015   INR 1.21 11/07/2015   INR 1.27 09/25/2015     Jenkins Rouge 11/08/2015, 7:56 AM

## 2015-11-08 NOTE — Progress Notes (Signed)
PROGRESS NOTE    [name]  [id number] DOB: [birth year] DOA: 10/30/2015 PCP: [name], MD  Outpatient Specialists: Dr. Florene Glen    Brief Narrative:  70 y.o. female with medical history significant of CKD stage V, nephrolithiasis, HTN, CAD, anemia of chronic disease, s/p right nephrostomy tube; who presented with complaints of 1 day history of confusion and fever. Patient noted to have  WBC of 20k with UA suggestive of UTI. Patient was started on IVF and antibiotics. She is followed by Dr. Florene Glen as outpatient for stage 5 CKD with fistula placed in the past few weeks, already has tunneled dialysis catheter. Presenting Cr noted to be 8.06 with BUN of 82, no significant improvement with IV fluids, seen by renal, started hemodialysis on 5/15, was transferred to stepdown 5/15 for an episode of chest pain post hemodialysis, with negative workup so far. Patient was about to be discharged on 5/18, then went into A. fib with RVR. Transferred to stepdown unit again and started on IV amiodarone drip. Cardiology consulted.   Assessment & Plan:   Principal Problem:   Atrial fibrillation with RVR (HCC) Active Problems:   Essential hypertension, benign   End stage renal disease (HCC)   Anemia of renal disease   CKD (chronic kidney disease) stage 5, GFR less than 15 ml/min (HCC)   Urinary tract infectious disease   PAF (paroxysmal atrial fibrillation) (HCC)  Sepsis secondary to UTI related to chronic percutaneous nephrostomy - Sepsis criteria present on admission. Prior history of Pseudomonas infection. - Empiric vancomycin and cefepime started on admission, stopped IV vancomycin giving negative cultures - Urine culture growing Pseudomonas aeruginosa, cefepime > changed to ceftazidime across HD. Blood cultures 2: Negative. - As discussed with nephrology, complete total 14 days course and then consider suppressive antibiotics. Outpatient follow-up with Dr. Era Bumpers, urology. - Sepsis  has resolved.  New ESRD - Presenting Creatinine elevated at 8.06 with a BUN of 82, outpatient creatinine around 6 - Tunneled HD cath present on admission. Maturing LUE AVF - Patient started hemodialysis on 11/02/2015-new to dialysis. EDW: 78.5 KG - Management per renal. Sees Dr. Florene Glen as outpatient.  Chronic obstructive uropathy with chronic PCN - Patient with right nephrostomy tube, changed 5/17 by IR. Outpatient follow-up with Dr. Era Bumpers.  Acute encephalopathy  - In the setting of infectious process, resolved,  Essential hypertension - Continued on metoprolol. Intermittent low blood pressures-discussed with cardiology, may change metoprolol to twice a day on days of HD.  GERD - Cont H2 blocker  Anemia of chronic renal disease - Aranesp per renal  Chest pain - Muscular skeletal feature, troponins negative 3, EKG nonacute, with repeat EKG, and 2-D echo: LVEF 55-60 percent, no wall motion abnormalities and grade 1 diastolic dysfunction.  Leukocytosis - Resolved  Paroxysmal A. fib with RVR - Prior history of A. fib in the context of acute illness, had been taken off amiodarone. Cardiomyopathy had resolved. - On 5/18, developed A. fib with RVR-relatively asymptomatic. Cardiology consulted. Transferred to stepdown unit. IV amiodarone started. Despite couple days of IV amiodarone, remained in A. fib. Amiodarone discontinued. Metoprolol titrated up. IV heparin bridging and Coumadin initiated. - DC home when INR therapeutic. Cardiology will consider DCCV in 4 weeks. - TSH normal.   DVT prophylaxis: IV heparin bridging and Coumadin Code Status: Full Family Communication: Discussed with patient. No family at bedside. Disposition Plan: DC home when medically stable & INR therapeutic.   Consultants:   Nephrology  Cardiology  Procedures:   HD  Antimicrobials:  IV cefepime 5/12 > 5/17  IV Fortaz across HD 5/18 >  IV vancomycin 5/12 > 5/14    Subjective: Denies  complaints. No palpitations, chest pain or dyspnea.   Objective: Filed Vitals:   11/07/15 1617 11/07/15 2033 11/07/15 2215 11/08/15 0450  BP: 130/56 115/69 107/52 104/50  Pulse: 130 93 81 79  Temp:  98.2 F (36.8 C)  98.2 F (36.8 C)  TempSrc:  Oral  Oral  Resp:  18  18  Height:      Weight:      SpO2:  100%  99%    Intake/Output Summary (Last 24 hours) at 11/08/15 1329 Last data filed at 11/07/15 1359  Gross per 24 hour  Intake      0 ml  Output    209 ml  Net   -209 ml   Filed Weights   11/05/15 1055 11/07/15 1045 11/07/15 1359  Weight: 77.8 kg (171 lb 8.3 oz) 78.7 kg (173 lb 8 oz) 78.7 kg (173 lb 8 oz)    Examination:  General exam: Pleasant elderly female sitting up comfortably in chair this morning. Respiratory system: Clear to auscultation. Respiratory effort normal. Cardiovascular system: S1 & S2 heard, irregularly irregular. No JVD, murmurs. Telemetry: Reverted to sinus rhythm on 5/20 at 8:50 PM.  Gastrointestinal system: Abdomen is nondistended, soft and nontender.  BS+, right nephrostomy tube site without acute findings Central nervous system: Alert and oriented. No focal neurological deficits. Extremities: Symmetric 5 x 5 power. Skin: No rashes, lesions Psychiatry: Judgement and insight appear normal. Mood & affect appropriate.     Data Reviewed: I have personally reviewed following labs and imaging studies  CBC:  Recent Labs Lab 11/04/15 0515 11/05/15 0530 11/07/15 0322 11/07/15 1528 11/08/15 0324  WBC 12.6* 13.5* 10.1 14.7* 9.0  HGB 9.4* 10.6* 9.4* 10.2* 9.4*  HCT 30.7* 34.7* 31.5* 34.0* 31.6*  MCV 88.0 87.4 86.8 85.9 87.3  PLT 286 295 237 152 [id number]   Basic Metabolic Panel:  Recent Labs Lab 11/02/15 0534 11/03/15 0543 11/04/15 0515 11/05/15 0718 11/07/15 1107  NA 139 139 138 136 136  K 4.8 4.6 4.9 4.1 4.1  CL 106 101 100* 96* 98*  CO2 20* 24 24 26 25   GLUCOSE 95 126* 102* 145* 93  BUN 72* 48* 53* 30* 34*  CREATININE 7.87* 6.52*  6.69* 4.88* 5.73*  CALCIUM 9.0 9.1 9.0 8.7* 8.8*  PHOS 5.0*  --   --  3.1 3.6   GFR: Estimated Creatinine Clearance: 9.1 mL/min (by C-G formula based on Cr of 5.73). Liver Function Tests:  Recent Labs Lab 11/02/15 0534 11/05/15 0718 11/07/15 1107  ALBUMIN 2.4* 2.2* 2.2*   No results for input(s): LIPASE, AMYLASE in the last 168 hours. No results for input(s): AMMONIA in the last 168 hours. Coagulation Profile:  Recent Labs Lab 11/07/15 0322 11/08/15 0324  INR 1.21 1.30   Cardiac Enzymes:  Recent Labs Lab 11/02/15 2010 11/03/15 0007 11/03/15 0543  TROPONINI <0.03 <0.03 0.03   BNP (last 3 results) No results for input(s): PROBNP in the last 8760 hours. HbA1C: No results for input(s): HGBA1C in the last 72 hours. CBG: No results for input(s): GLUCAP in the last 168 hours. Lipid Profile: No results for input(s): CHOL, HDL, LDLCALC, TRIG, CHOLHDL, LDLDIRECT in the last 72 hours. Thyroid Function Tests:  Recent Labs  11/05/15 1417  TSH 1.242   Anemia Panel: No results for input(s): VITAMINB12, FOLATE, FERRITIN, TIBC, IRON, RETICCTPCT in the last 72  hours. Urine analysis:    Component Value Date/Time   COLORURINE YELLOW 10/31/2015 0034   APPEARANCEUR TURBID* 10/31/2015 0034   LABSPEC 1.011 10/31/2015 0034   PHURINE 6.0 10/31/2015 0034   GLUCOSEU NEGATIVE 10/31/2015 0034   HGBUR LARGE* 10/31/2015 0034   BILIRUBINUR NEGATIVE 10/31/2015 0034   BILIRUBINUR neg 09/18/2014 0920   KETONESUR NEGATIVE 10/31/2015 0034   PROTEINUR 100* 10/31/2015 0034   PROTEINUR 2+ 09/18/2014 0920   UROBILINOGEN 0.2 05/02/2015 2020   UROBILINOGEN negative 09/18/2014 0920   NITRITE NEGATIVE 10/31/2015 0034   NITRITE neg 09/18/2014 0920   LEUKOCYTESUR LARGE* 10/31/2015 0034   Sepsis Labs: No results for input(s): PROCALCITON, LATICACIDVEN in the last 168 hours.  Recent Results (from the past 240 hour(s))  Culture, blood (routine x 2)     Status: None   Collection Time:  10/31/15 12:10 AM  Result Value Ref Range Status   Specimen Description BLOOD RIGHT FOREARM  Final   Special Requests BOTTLES DRAWN AEROBIC AND ANAEROBIC 5CC  Final   Culture   Final    NO GROWTH 5 DAYS Performed at [facility]    Report Status 11/05/2015 FINAL  Final  Urine culture     Status: Abnormal   Collection Time: 10/31/15 12:34 AM  Result Value Ref Range Status   Specimen Description URINE, RANDOM  Final   Special Requests NONE  Final   Culture >=100,000 COLONIES/mL PSEUDOMONAS AERUGINOSA (A)  Final   Report Status 11/03/2015 FINAL  Final   Organism ID, Bacteria PSEUDOMONAS AERUGINOSA (A)  Final      Susceptibility   Pseudomonas aeruginosa - MIC*    CEFTAZIDIME 4 SENSITIVE Sensitive     CIPROFLOXACIN 0.5 INTERMEDIATE Intermediate     GENTAMICIN 2 SENSITIVE Sensitive     IMIPENEM 1 SENSITIVE Sensitive     PIP/TAZO 16 SENSITIVE Sensitive     CEFEPIME 2 SENSITIVE Sensitive     * >=100,000 COLONIES/mL PSEUDOMONAS AERUGINOSA  Culture, blood (routine x 2)     Status: None   Collection Time: 10/31/15 12:56 AM  Result Value Ref Range Status   Specimen Description BLOOD RIGHT ARM  Final   Special Requests BOTTLES DRAWN AEROBIC AND ANAEROBIC 10CC  Final   Culture   Final    NO GROWTH 5 DAYS Performed at [facility]    Report Status 11/05/2015 FINAL  Final  MRSA PCR Screening     Status: None   Collection Time: 10/31/15  7:37 AM  Result Value Ref Range Status   MRSA by PCR NEGATIVE NEGATIVE Final    Comment:        The GeneXpert MRSA Assay (FDA approved for NASAL specimens only), is one component of a comprehensive MRSA colonization surveillance program. It is not intended to diagnose MRSA infection nor to guide or monitor treatment for MRSA infections.          Radiology Studies: No results found.      Scheduled Meds: . cefTAZidime (FORTAZ)  IV  2 g Intravenous Q T,Th,Sa-HD  . darbepoetin (ARANESP) injection - DIALYSIS  60 mcg  Intravenous Q Sat-HD  . famotidine  20 mg Oral Daily  . feeding supplement (NEPRO CARB STEADY)  237 mL Oral TID BM  . latanoprost  1 drop Both Eyes QHS  . metoprolol tartrate  25 mg Oral TID  . saccharomyces boulardii  250 mg Oral BID  . timolol  1 drop Both Eyes q morning - 10a  . triamcinolone  1 application  Topical BID  . warfarin  7.5 mg Oral ONCE-1800  . Warfarin - Pharmacist Dosing Inpatient   Does not apply q1800   Continuous Infusions: . heparin 1,350 Units/hr (11/07/15 1618)     LOS: 8 days    Vickie Melnik, MD [facility] Pager [phone]  If 7PM-7AM, please contact night-coverage [email or web address] Password [email or web address] 11/08/2015, 1:29 PM

## 2015-11-08 NOTE — Progress Notes (Signed)
ANTICOAGULATION CONSULT NOTE - Follow Up Consult  Pharmacy Consult for heparin/warfarin Indication: atrial fibrillation  Allergies  Allergen Reactions  . Vicodin [Hydrocodone-Acetaminophen] Nausea And Vomiting  . Chlorhexidine Rash    Sunburn    rash  . Percocet [Oxycodone-Acetaminophen] Nausea And Vomiting    Patient Measurements: Height: 5\' 3"  (160 cm) Weight: 173 lb 8 oz (78.7 kg) IBW/kg (Calculated) : 52.4 Heparin Dosing Weight: 70kg  Vital Signs: Temp: 98.2 F (36.8 C) (05/21 0450) Temp Source: Oral (05/21 0450) BP: 104/50 mmHg (05/21 0450) Pulse Rate: 79 (05/21 0450)  Labs:  Recent Labs  11/07/15 0322 11/07/15 1107 11/07/15 1528 11/08/15 0324  HGB 9.4*  --  10.2* 9.4*  HCT 31.5*  --  34.0* 31.6*  PLT 237  --  152 159  LABPROT 15.5*  --   --  16.4*  INR 1.21  --   --  1.30  HEPARINUNFRC 0.29*  --  0.39 0.40  CREATININE  --  5.73*  --   --     Estimated Creatinine Clearance: 9.1 mL/min (by C-G formula based on Cr of 5.73).  Assessment: 66 YOF, new ESRD on HD patient, with AFib with RVR.Pharmacy consulted to start warfarin and heparin IV. Heparin level remains therapeutic. INR is low at 1.3. No bleeding noted  Goal of Therapy:  INR 2-3, HL 0.3 -0.7 Monitor platelets by anticoagulation protocol: Yes   Plan:  - Warfarin 7.5mg  PO x 1 tonight - Continue heparin gtt 1350 units/hr - Daily heparin level, INR and CBC  Salome Arnt, PharmD, BCPS Pager # [phone] 11/08/2015 11:32 AM

## 2015-11-09 DIAGNOSIS — I10 Essential (primary) hypertension: Secondary | ICD-10-CM

## 2015-11-09 DIAGNOSIS — N186 End stage renal disease: Secondary | ICD-10-CM

## 2015-11-09 LAB — CBC
HCT: 30.1 % — ABNORMAL LOW (ref 36.0–46.0)
Hemoglobin: 9 g/dL — ABNORMAL LOW (ref 12.0–15.0)
MCH: 26.4 pg (ref 26.0–34.0)
MCHC: 29.9 g/dL — ABNORMAL LOW (ref 30.0–36.0)
MCV: 88.3 fL (ref 78.0–100.0)
Platelets: 157 10*3/uL (ref 150–400)
RBC: 3.41 MIL/uL — ABNORMAL LOW (ref 3.87–5.11)
RDW: 18 % — ABNORMAL HIGH (ref 11.5–15.5)
WBC: 9.2 10*3/uL (ref 4.0–10.5)

## 2015-11-09 LAB — HEPARIN LEVEL (UNFRACTIONATED): Heparin Unfractionated: 0.4 IU/mL (ref 0.30–0.70)

## 2015-11-09 LAB — PROTIME-INR
INR: 1.39 (ref 0.00–1.49)
Prothrombin Time: 17.1 seconds — ABNORMAL HIGH (ref 11.6–15.2)

## 2015-11-09 MED ORDER — WARFARIN SODIUM 7.5 MG PO TABS
7.5000 mg | ORAL_TABLET | Freq: Once | ORAL | Status: AC
  Administered 2015-11-09: 7.5 mg via ORAL
  Filled 2015-11-09: qty 1

## 2015-11-09 NOTE — Progress Notes (Signed)
[place] for heparin/warfarin Indication: atrial fibrillation  Allergies  Allergen Reactions  . Vicodin [Hydrocodone-Acetaminophen] Nausea And Vomiting  . Chlorhexidine Rash    Sunburn    rash  . Percocet [Oxycodone-Acetaminophen] Nausea And Vomiting    Patient Measurements: Height: 5\' 3"  (160 cm) Weight: 172 lb 1.6 oz (78.064 kg) IBW/kg (Calculated) : 52.4 Heparin Dosing Weight: 70kg  Vital Signs: Temp: 98 F (36.7 C) (05/22 1129) Temp Source: Oral (05/22 1129) BP: 104/54 mmHg (05/22 1129) Pulse Rate: 87 (05/22 1129)  Labs:  Recent Labs  11/07/15 0322 11/07/15 1107 11/07/15 1528 11/08/15 0324 11/09/15 0340  HGB 9.4*  --  10.2* 9.4* 9.0*  HCT 31.5*  --  34.0* 31.6* 30.1*  PLT 237  --  152 159 157  LABPROT 15.5*  --   --  16.4* 17.1*  INR 1.21  --   --  1.30 1.39  HEPARINUNFRC 0.29*  --  0.39 0.40 0.40  CREATININE  --  5.73*  --   --   --     Estimated Creatinine Clearance: 9 mL/min (by C-G formula based on Cr of 5.73).  Assessment: 70 year old female, new ESRD on HD patient, with AFib with RVR. Pharmacy consulted to start warfarin and heparin IV. Heparin level remains therapeutic. INR is low at 1.39, as expected after only 3 doses. No bleeding noted.   Goal of Therapy:  INR 2-3, HL 0.3 -0.7 Monitor platelets by anticoagulation protocol: Yes    Plan:  - Warfarin 7.5 mg po x1 - Continue heparin gtt 1350 units/hr - Daily heparin level, INR and CBC - Stop heparin when INR > 2   Hughes Better, PharmD, BCPS Clinical Pharmacist 11/09/2015 11:58 AM

## 2015-11-09 NOTE — Progress Notes (Signed)
Patient ID: [name], female   DOB: Oct 28, 1945, 70 y.o.   MRN: [id number]  [facility] Progress Note   Assessment/ Plan:   1. Pseudomonas UTI/sepsis with chronic right PCN: On ceftazidime-will need clarification of duration of therapy to see if this needs to be continued at hemodialysis. Markers of infection appear to be improving-afebrile and with improvement of leukocytosis. 2. ESRD: Plan for next hemodialysis tomorrow-no acute needs noted at this time. Clinically appears to be euvolemic and without significant uremic signs-appetite still lagging. 3. Anemia: Low hemoglobin levels, no overt loss, continue to monitor on ESA. Iron saturations borderline low, will give single dose of intravenous iron 4. CKD-MBD: Calcium/phosphorus currently at acceptable levels-not on binders. May need this in the near future. 5. Nutrition: Low albumin likely from chronic malnutrition/chronic kidney disease as well as chronic inflammatory complex from PCN/recurrent UTI. 6. Hypertension: Blood pressures currently acceptable, continue monitoring current therapy  Subjective:   Reports to be feeling better but inquires about chronic suppressive therapy for UTIs.    Objective:   BP 105/50 mmHg  Pulse 82  Temp(Src) 98 F (36.7 C) (Oral)  Resp 18  Ht 5\' 3"  (1.6 m)  Wt 78.064 kg (172 lb 1.6 oz)  BMI 30.49 kg/m2  SpO2 97%  LMP 01/19/1992 (Approximate)  Physical Exam: [id number] resting in a recliner watching television [id number] regular rhythm, normal rate, S1 and S2 normal Resp: Clear to auscultation bilaterally, no rales Abd: Soft, flat, nontender Ext: No lower extremity edema. With right IJ TDC and maturing left BBF.  Labs: BMET  Recent Labs Lab 11/03/15 0543 11/04/15 0515 11/05/15 0718 11/07/15 1107  NA 139 138 136 136  K 4.6 4.9 4.1 4.1  CL 101 100* 96* 98*  CO2 24 24 26 25   GLUCOSE 126* 102* 145* 93  BUN 48* 53* 30* 34*  CREATININE 6.52* 6.69* 4.88* 5.73*  CALCIUM  9.1 9.0 8.7* 8.8*  PHOS  --   --  3.1 3.6   CBC  Recent Labs Lab 11/07/15 0322 11/07/15 1528 11/08/15 0324 11/09/15 0340  WBC 10.1 14.7* 9.0 9.2  HGB 9.4* 10.2* 9.4* 9.0*  HCT 31.5* 34.0* 31.6* 30.1*  MCV 86.8 85.9 87.3 88.3  PLT 237 152 159 157   Medications:    . cefTAZidime (FORTAZ)  IV  2 g Intravenous Q T,Th,Sa-HD  . darbepoetin (ARANESP) injection - DIALYSIS  60 mcg Intravenous Q Sat-HD  . famotidine  20 mg Oral Daily  . feeding supplement (NEPRO CARB STEADY)  237 mL Oral TID BM  . latanoprost  1 drop Both Eyes QHS  . metoprolol tartrate  25 mg Oral 3 times per day on Sun Mon Wed Fri   And  . [START ON 11/10/2015] metoprolol tartrate  25 mg Oral 2 times per day on Tue Thu Sat  . saccharomyces boulardii  250 mg Oral BID  . timolol  1 drop Both Eyes q morning - 10a  . triamcinolone  1 application Topical BID  . Warfarin - Pharmacist Dosing Inpatient   Does not apply [id number]   Elmarie Shiley, MD 11/09/2015, 10:23 AM

## 2015-11-09 NOTE — Progress Notes (Signed)
PROGRESS NOTE    [name]  [id number] DOB: 1945-07-23 DOA: 10/30/2015 PCP: [name], MD  Outpatient Specialists: Dr. Florene Glen    Brief Narrative:  70 y.o. female with medical history significant of CKD stage V, nephrolithiasis, HTN, CAD, anemia of chronic disease, s/p right nephrostomy tube; who presented with complaints of 1 day history of confusion and fever. Patient noted to have  WBC of 20k with UA suggestive of UTI. Patient was started on IVF and antibiotics. She is followed by Dr. Florene Glen as outpatient for stage 5 CKD with fistula placed in the past few weeks, already has tunneled dialysis catheter. Presenting Cr noted to be 8.06 with BUN of 82, no significant improvement with IV fluids, seen by renal, started hemodialysis on 5/15, was transferred to stepdown 5/15 for an episode of chest pain post hemodialysis, with negative workup so far. Patient was about to be discharged on 5/18, then went into A. fib with RVR. Transferred to stepdown unit again and started on IV amiodarone drip. Cardiology consulted. Reverted to sinus rhythm a couple days ago. Awaiting INR to become therapeutic prior to discharge home.   Assessment & Plan:   Principal Problem:   Atrial fibrillation with RVR (HCC) Active Problems:   Essential hypertension, benign   End stage renal disease (HCC)   Anemia of renal disease   CKD (chronic kidney disease) stage 5, GFR less than 15 ml/min (HCC)   Urinary tract infectious disease   PAF (paroxysmal atrial fibrillation) (HCC)  Sepsis secondary to UTI related to chronic percutaneous nephrostomy - Sepsis criteria present on admission. Prior history of Pseudomonas infection. - Empiric vancomycin and cefepime started on admission, stopped IV vancomycin giving negative cultures - Urine culture growing Pseudomonas aeruginosa, cefepime > changed to ceftazidime across HD. Blood cultures 2: Negative. - As discussed with nephrology, complete total 14 days  course (start date 10/30/15) and then consider suppressive antibiotics. Outpatient follow-up with Dr. Era Bumpers, urology. - Sepsis has resolved.  New ESRD - Presenting Creatinine elevated at 8.06 with a BUN of 82, outpatient creatinine around 6 - Tunneled HD cath present on admission. Maturing LUE AVF - Patient started hemodialysis on 11/02/2015-new to dialysis. EDW: 78.5 KG - Management per renal. Sees Dr. Florene Glen as outpatient.  Chronic obstructive uropathy with chronic PCN - Patient with right nephrostomy tube, changed 5/17 by IR. Outpatient follow-up with Dr. Era Bumpers.  Acute encephalopathy  - In the setting of infectious process, resolved,  Essential hypertension - Continued on metoprolol. Intermittent low blood pressures-discussed with cardiology, may change metoprolol to twice a day on days of HD.  GERD - Cont H2 blocker  Anemia of chronic renal disease - Aranesp per renal  Chest pain - Muscular skeletal feature, troponins negative 3, EKG nonacute, with repeat EKG, and 2-D echo: LVEF 55-60 percent, no wall motion abnormalities and grade 1 diastolic dysfunction.  Leukocytosis - Resolved  Paroxysmal A. fib with RVR - Prior history of A. fib in the context of acute illness, had been taken off amiodarone. Cardiomyopathy had resolved. - On 5/18, developed A. fib with RVR-relatively asymptomatic. Cardiology consulted. Transferred to stepdown unit. IV amiodarone started. Despite couple days of IV amiodarone, remained in A. fib. Amiodarone discontinued. Metoprolol titrated up. IV heparin bridging and Coumadin initiated. INR 1.3. Awaiting INR to become therapeutic >2 prior to discharge. - DC home when INR therapeutic. Cardiology will consider DCCV in 4 weeks. - TSH normal. - Cardiology will arrange outpatient cardiac f/u with Dr. Irish Lack and coumadin clinic at  [facility].   DVT prophylaxis: IV heparin bridging and Coumadin Code Status: Full Family Communication: Discussed  with patient. No family at bedside. Disposition Plan: DC home when medically stable & INR therapeutic.   Consultants:   Nephrology  Cardiology  Procedures:   HD  Antimicrobials:  IV cefepime 5/12 > 5/17  IV Fortaz across HD 5/18 >  IV vancomycin 5/12 > 5/14    Subjective: Denies complaints. No palpitations, chest pain or dyspnea.   Objective: Filed Vitals:   11/08/15 2045 11/08/15 2132 11/09/15 0401 11/09/15 1129  BP: 102/47 117/57 105/50 104/54  Pulse: 87 95 82 87  Temp: 98 F (36.7 C) 98 F (36.7 C) 98 F (36.7 C) 98 F (36.7 C)  TempSrc: Oral Oral Oral Oral  Resp: 18  18 18   Height:      Weight:   78.064 kg (172 lb 1.6 oz)   SpO2: 98% 99% 97% 100%    Intake/Output Summary (Last 24 hours) at 11/09/15 1142 Last data filed at 11/09/15 0900  Gross per 24 hour  Intake    639 ml  Output   1200 ml  Net   -561 ml   Filed Weights   11/07/15 1045 11/07/15 1359 11/09/15 0401  Weight: 78.7 kg (173 lb 8 oz) 78.7 kg (173 lb 8 oz) 78.064 kg (172 lb 1.6 oz)    Examination:  General exam: Pleasant elderly female sitting up comfortably in chair this morning. Respiratory system: Clear to auscultation. Respiratory effort normal. Cardiovascular system: S1 & S2 heard, irregularly irregular. No JVD, murmurs. Telemetry: Reverted to sinus rhythm on 5/20 at 8:50 PM. Remains in sinus rhythm. Gastrointestinal system: Abdomen is nondistended, soft and nontender.  BS+, right nephrostomy tube site without acute findings Central nervous system: Alert and oriented. No focal neurological deficits. Extremities: Symmetric 5 x 5 power. Skin: No rashes, lesions Psychiatry: Judgement and insight appear normal. Mood & affect appropriate.     Data Reviewed: I have personally reviewed following labs and imaging studies  CBC:  Recent Labs Lab 11/05/15 0530 11/07/15 0322 11/07/15 1528 11/08/15 0324 11/09/15 0340  WBC 13.5* 10.1 14.7* 9.0 9.2  HGB 10.6* 9.4* 10.2* 9.4* 9.0*    HCT 34.7* 31.5* 34.0* 31.6* 30.1*  MCV 87.4 86.8 85.9 87.3 88.3  PLT 295 237 152 159 [id number]   Basic Metabolic Panel:  Recent Labs Lab 11/03/15 0543 11/04/15 0515 11/05/15 0718 11/07/15 1107  NA 139 138 136 136  K 4.6 4.9 4.1 4.1  CL 101 100* 96* 98*  CO2 24 24 26 25   GLUCOSE 126* 102* 145* 93  BUN 48* 53* 30* 34*  CREATININE 6.52* 6.69* 4.88* 5.73*  CALCIUM 9.1 9.0 8.7* 8.8*  PHOS  --   --  3.1 3.6   GFR: Estimated Creatinine Clearance: 9 mL/min (by C-G formula based on Cr of 5.73). Liver Function Tests:  Recent Labs Lab 11/05/15 0718 11/07/15 1107  ALBUMIN 2.2* 2.2*   No results for input(s): LIPASE, AMYLASE in the last 168 hours. No results for input(s): AMMONIA in the last 168 hours. Coagulation Profile:  Recent Labs Lab 11/07/15 0322 11/08/15 0324 11/09/15 0340  INR 1.21 1.30 1.39   Cardiac Enzymes:  Recent Labs Lab 11/02/15 2010 11/03/15 0007 11/03/15 0543  TROPONINI <0.03 <0.03 0.03   BNP (last 3 results) No results for input(s): PROBNP in the last 8760 hours. HbA1C: No results for input(s): HGBA1C in the last 72 hours. CBG: No results for input(s): GLUCAP in the last 168  hours. Lipid Profile: No results for input(s): CHOL, HDL, LDLCALC, TRIG, CHOLHDL, LDLDIRECT in the last 72 hours. Thyroid Function Tests: No results for input(s): TSH, T4TOTAL, FREET4, T3FREE, THYROIDAB in the last 72 hours. Anemia Panel: No results for input(s): VITAMINB12, FOLATE, FERRITIN, TIBC, IRON, RETICCTPCT in the last 72 hours. Urine analysis:    Component Value Date/Time   COLORURINE YELLOW 10/31/2015 0034   APPEARANCEUR TURBID* 10/31/2015 0034   LABSPEC 1.011 10/31/2015 0034   PHURINE 6.0 10/31/2015 0034   GLUCOSEU NEGATIVE 10/31/2015 0034   HGBUR LARGE* 10/31/2015 0034   BILIRUBINUR NEGATIVE 10/31/2015 0034   BILIRUBINUR neg 09/18/2014 0920   KETONESUR NEGATIVE 10/31/2015 0034   PROTEINUR 100* 10/31/2015 0034   PROTEINUR 2+ 09/18/2014 0920   UROBILINOGEN  0.2 05/02/2015 2020   UROBILINOGEN negative 09/18/2014 0920   NITRITE NEGATIVE 10/31/2015 0034   NITRITE neg 09/18/2014 0920   LEUKOCYTESUR LARGE* 10/31/2015 0034   Sepsis Labs: No results for input(s): PROCALCITON, LATICACIDVEN in the last 168 hours.  Recent Results (from the past 240 hour(s))  Culture, blood (routine x 2)     Status: None   Collection Time: 10/31/15 12:10 AM  Result Value Ref Range Status   Specimen Description BLOOD RIGHT FOREARM  Final   Special Requests BOTTLES DRAWN AEROBIC AND ANAEROBIC 5CC  Final   Culture   Final    NO GROWTH 5 DAYS Performed at [facility]    Report Status 11/05/2015 FINAL  Final  Urine culture     Status: Abnormal   Collection Time: 10/31/15 12:34 AM  Result Value Ref Range Status   Specimen Description URINE, RANDOM  Final   Special Requests NONE  Final   Culture >=100,000 COLONIES/mL PSEUDOMONAS AERUGINOSA (A)  Final   Report Status 11/03/2015 FINAL  Final   Organism ID, Bacteria PSEUDOMONAS AERUGINOSA (A)  Final      Susceptibility   Pseudomonas aeruginosa - MIC*    CEFTAZIDIME 4 SENSITIVE Sensitive     CIPROFLOXACIN 0.5 INTERMEDIATE Intermediate     GENTAMICIN 2 SENSITIVE Sensitive     IMIPENEM 1 SENSITIVE Sensitive     PIP/TAZO 16 SENSITIVE Sensitive     CEFEPIME 2 SENSITIVE Sensitive     * >=100,000 COLONIES/mL PSEUDOMONAS AERUGINOSA  Culture, blood (routine x 2)     Status: None   Collection Time: 10/31/15 12:56 AM  Result Value Ref Range Status   Specimen Description BLOOD RIGHT ARM  Final   Special Requests BOTTLES DRAWN AEROBIC AND ANAEROBIC 10CC  Final   Culture   Final    NO GROWTH 5 DAYS Performed at [facility]    Report Status 11/05/2015 FINAL  Final  MRSA PCR Screening     Status: None   Collection Time: 10/31/15  7:37 AM  Result Value Ref Range Status   MRSA by PCR NEGATIVE NEGATIVE Final    Comment:        The GeneXpert MRSA Assay (FDA approved for NASAL specimens only), is one  component of a comprehensive MRSA colonization surveillance program. It is not intended to diagnose MRSA infection nor to guide or monitor treatment for MRSA infections.          Radiology Studies: No results found.      Scheduled Meds: . cefTAZidime (FORTAZ)  IV  2 g Intravenous Q T,Th,Sa-HD  . darbepoetin (ARANESP) injection - DIALYSIS  60 mcg Intravenous Q Sat-HD  . famotidine  20 mg Oral Daily  . feeding supplement (NEPRO CARB STEADY)  237 mL Oral TID BM  . latanoprost  1 drop Both Eyes QHS  . metoprolol tartrate  25 mg Oral 3 times per day on Sun Mon Wed Fri   And  . [START ON 11/10/2015] metoprolol tartrate  25 mg Oral 2 times per day on Tue Thu Sat  . saccharomyces boulardii  250 mg Oral BID  . timolol  1 drop Both Eyes q morning - 10a  . triamcinolone  1 application Topical BID  . Warfarin - Pharmacist Dosing Inpatient   Does not apply q1800   Continuous Infusions: . heparin 1,350 Units/hr (11/09/15 0358)     LOS: 9 days    Coni Homesley, MD [facility] Pager [phone]  If 7PM-7AM, please contact night-coverage [email or web address] Password [facility] 11/09/2015, 11:42 AM

## 2015-11-09 NOTE — Progress Notes (Signed)
Patient ID: [name], female   DOB: 1945/12/16, 70 y.o.   MRN: [id number]    Subjective:  Converted to NSR overnight. Feels well. INR subtherapeutic.  Objective:  Filed Vitals:   11/08/15 1447 11/08/15 2045 11/08/15 2132 11/09/15 0401  BP: 124/51 102/47 117/57 105/50  Pulse: 87 87 95 82  Temp:  98 F (36.7 C) 98 F (36.7 C) 98 F (36.7 C)  TempSrc:  Oral Oral Oral  Resp: 20 18  18   Height:      Weight:    172 lb 1.6 oz (78.064 kg)  SpO2: 99% 98% 99% 97%    Intake/Output from previous day:  Intake/Output Summary (Last 24 hours) at 11/09/15 [id number] Last data filed at 11/09/15 0900  Gross per 24 hour  Intake    639 ml  Output   1200 ml  Net   -561 ml    Physical Exam: Affect appropriate Chronically ill white female  HEENT: normal Neck supple with no adenopathy JVP normal no bruits no thyromegaly right tunneled subclavian line  Lungs clear with no wheezing and good diaphragmatic motion Heart:  S1/S2 SEM  murmur, no rub, gallop or click PMI normal Abdomen: benighn, BS positve, no tenderness, no AAA no bruit.  No HSM or HJR Distal pulses intact  Non mature LUE fistula with thrill  No edema Neuro non-focal Skin warm and dry No muscular weakness   Lab Results: Basic Metabolic Panel:  Recent Labs  11/07/15 1107  NA 136  K 4.1  CL 98*  CO2 25  GLUCOSE 93  BUN 34*  CREATININE 5.73*  CALCIUM 8.8*  PHOS 3.6   Liver Function Tests:  Recent Labs  11/07/15 1107  ALBUMIN 2.2*   CBC:  Recent Labs  11/08/15 0324 11/09/15 0340  WBC 9.0 9.2  HGB 9.4* 9.0*  HCT 31.6* 30.1*  MCV 87.3 88.3  PLT 159 157   Thyroid Function Tests: No results for input(s): TSH, T4TOTAL, T3FREE, THYROIDAB in the last 72 hours.  Invalid input(s): FREET3  Imaging: No results found.  Medications:   . cefTAZidime (FORTAZ)  IV  2 g Intravenous Q T,Th,Sa-HD  . darbepoetin (ARANESP) injection - DIALYSIS  60 mcg Intravenous Q Sat-HD  . famotidine  20 mg Oral Daily  .  feeding supplement (NEPRO CARB STEADY)  237 mL Oral TID BM  . latanoprost  1 drop Both Eyes QHS  . metoprolol tartrate  25 mg Oral 3 times per day on Sun Mon Wed Fri   And  . [START ON 11/10/2015] metoprolol tartrate  25 mg Oral 2 times per day on Tue Thu Sat  . saccharomyces boulardii  250 mg Oral BID  . timolol  1 drop Both Eyes q morning - 10a  . triamcinolone  1 application Topical BID  . Warfarin - Pharmacist Dosing Inpatient   Does not apply q1800     . heparin 1,350 Units/hr (11/09/15 0358)    Assessment/Plan:   PAF:  Now in sinus rhythm. On metoprolol, amiodarone stopped. Stop heparin when INR Rx.  Will arrange outpatient cardiac f/u with Dr. Irish Lack and coumadin clinic at [facility].  Dialysis: fistula not mature Access right tunnelled subclavian line  UTI:  On Fortaz plan per primary service ? Change to PO  D/C when INR Rx   Lab Results  Component Value Date   INR 1.39 11/09/2015   INR 1.30 11/08/2015   INR 1.21 11/07/2015   Cardiology will sign-off. Call with questions.  Chrissie Noa  C. Debara Pickett, MD, [facility] Attending Cardiologist [place] 11/09/2015, 9:42 AM

## 2015-11-09 NOTE — Care Management Important Message (Signed)
Important Message  Patient Details  Name: [name] MRN: [id number] Date of Birth: 1945-07-19   Medicare Important Message Given:  Yes    Nathen May 11/09/2015, 1:33 PM

## 2015-11-10 LAB — RENAL FUNCTION PANEL
Albumin: 2.3 g/dL — ABNORMAL LOW (ref 3.5–5.0)
Anion gap: 9 (ref 5–15)
BUN: 37 mg/dL — ABNORMAL HIGH (ref 6–20)
CO2: 25 mmol/L (ref 22–32)
Calcium: 9.2 mg/dL (ref 8.9–10.3)
Chloride: 102 mmol/L (ref 101–111)
Creatinine, Ser: 6.95 mg/dL — ABNORMAL HIGH (ref 0.44–1.00)
GFR calc Af Amer: 6 mL/min — ABNORMAL LOW (ref >60)
GFR calc non Af Amer: 5 mL/min — ABNORMAL LOW (ref >60)
Glucose, Bld: 93 mg/dL (ref 65–99)
Phosphorus: 4.4 mg/dL (ref 2.5–4.6)
Potassium: 4.1 mmol/L (ref 3.5–5.1)
Sodium: 136 mmol/L (ref 135–145)

## 2015-11-10 LAB — HEPARIN LEVEL (UNFRACTIONATED)
Heparin Unfractionated: >2.2 IU/mL — ABNORMAL HIGH (ref 0.30–0.70)
Heparin Unfractionated: 0.29 IU/mL — ABNORMAL LOW (ref 0.30–0.70)
Heparin Unfractionated: 0.58 IU/mL (ref 0.30–0.70)

## 2015-11-10 LAB — CBC
HCT: 30.1 % — ABNORMAL LOW (ref 36.0–46.0)
Hemoglobin: 9 g/dL — ABNORMAL LOW (ref 12.0–15.0)
MCH: 26.9 pg (ref 26.0–34.0)
MCHC: 29.9 g/dL — ABNORMAL LOW (ref 30.0–36.0)
MCV: 89.9 fL (ref 78.0–100.0)
Platelets: 194 10*3/uL (ref 150–400)
RBC: 3.35 MIL/uL — ABNORMAL LOW (ref 3.87–5.11)
RDW: 18.2 % — ABNORMAL HIGH (ref 11.5–15.5)
WBC: 7.5 10*3/uL (ref 4.0–10.5)

## 2015-11-10 LAB — PROTIME-INR
INR: 1.9 — ABNORMAL HIGH (ref 0.00–1.49)
Prothrombin Time: 21.7 seconds — ABNORMAL HIGH (ref 11.6–15.2)

## 2015-11-10 LAB — PTH, INTACT AND CALCIUM
Calcium, Total (PTH): 8.8 mg/dL (ref 8.7–10.3)
PTH: 51 pg/mL (ref 15–65)

## 2015-11-10 MED ORDER — ACETAMINOPHEN 325 MG PO TABS
ORAL_TABLET | ORAL | Status: AC
  Filled 2015-11-10: qty 2

## 2015-11-10 MED ORDER — WARFARIN SODIUM 7.5 MG PO TABS
7.5000 mg | ORAL_TABLET | Freq: Once | ORAL | Status: AC
  Administered 2015-11-10: 7.5 mg via ORAL
  Filled 2015-11-10: qty 1

## 2015-11-10 NOTE — Progress Notes (Addendum)
[facility] for heparin/warfarin Indication: atrial fibrillation  Allergies  Allergen Reactions  . Vicodin [Hydrocodone-Acetaminophen] Nausea And Vomiting  . Chlorhexidine Rash    Sunburn    rash  . Percocet [Oxycodone-Acetaminophen] Nausea And Vomiting    Patient Measurements: Height: 5\' 3"  (160 cm) Weight: 173 lb 1 oz (78.5 kg) IBW/kg (Calculated) : 52.4 Heparin Dosing Weight: 70kg  Vital Signs: Temp: 97.8 F (36.6 C) (05/23 1316) Temp Source: Oral (05/23 1316) BP: 128/60 mmHg (05/23 1316) Pulse Rate: 97 (05/23 1316)  Labs:  Recent Labs  11/08/15 0324 11/09/15 0340 11/10/15 0815 11/10/15 1143  HGB 9.4* 9.0* 9.0*  --   HCT 31.6* 30.1* 30.1*  --   PLT 159 157 194  --   LABPROT 16.4* 17.1*  --  21.7*  INR 1.30 1.39  --  1.90*  HEPARINUNFRC 0.40 0.40 >2.20* 0.29*  CREATININE  --   --  6.95*  --     Estimated Creatinine Clearance: 7.5 mL/min (by C-G formula based on Cr of 6.95).  Assessment: 70 year old female, new ESRD on HD patient, with AFib with RVR. Pharmacy consulted to start warfarin and heparin IV. Heparin level is subtherapeutic, will increase rate. INR 1.9. No bleeding noted. HL drawn earlier this morning resulted at 2.2, this was obsolete due to being drawn incorrectly.   Goal of Therapy:  INR 2-3, HL 0.3 -0.7 Monitor platelets by anticoagulation protocol: Yes    Plan:  - Warfarin 7.5 mg po x1 - Increase heparin gtt to 1500 units/hr - Daily heparin level, INR and CBC - Stop heparin when INR > 2   Hughes Better, PharmD, BCPS Clinical Pharmacist 11/10/2015 1:36 PM    Addendum: Heparin level is now therapeutic at 0.58. Continue heparin at 1500 units/hr and follow up AM labs.  Nena Jordan, PharmD, BCPS 11/10/2015, 10:07 PM

## 2015-11-10 NOTE — Procedures (Signed)
Patient seen on Hemodialysis. In atrial fibrillation and HR in 90-110 range with mild hypotension--UF goal decreased. Next HD thursday QB 350, UF goal 1.5L Treatment adjusted as needed.  Elmarie Shiley MD [facility]. Office # [phone] Pager # [phone] 11:04 AM

## 2015-11-10 NOTE — Progress Notes (Signed)
Utilization review completed.  

## 2015-11-10 NOTE — Progress Notes (Signed)
PROGRESS NOTE    [name]  [id number] DOB: May 20, 1946 DOA: 10/30/2015 PCP: [name], MD  Outpatient Specialists: Dr. Florene Glen    Brief Narrative:  70 y.o. female with medical history significant of CKD stage V, nephrolithiasis, HTN, CAD, anemia of chronic disease, s/p right nephrostomy tube; who presented with complaints of 1 day history of confusion and fever. Patient noted to have  WBC of 20k with UA suggestive of UTI. Patient was started on IVF and antibiotics. She is followed by Dr. Florene Glen as outpatient for stage 5 CKD with fistula placed in the past few weeks, already has tunneled dialysis catheter. Presenting Cr noted to be 8.06 with BUN of 82, no significant improvement with IV fluids, seen by renal, started hemodialysis on 5/15, was transferred to stepdown 5/15 for an episode of chest pain post hemodialysis, with negative workup so far. Patient was about to be discharged on 5/18, then went into A. fib with RVR. Transferred to stepdown unit again and started on IV amiodarone drip. Cardiology consulted. Reverted to sinus rhythm a couple days ago. Awaiting INR to become therapeutic prior to discharge home.   Assessment & Plan:   Principal Problem:   Atrial fibrillation with RVR (HCC) Active Problems:   Essential hypertension, benign   End stage renal disease (HCC)   Anemia of renal disease   CKD (chronic kidney disease) stage 5, GFR less than 15 ml/min (HCC)   Urinary tract infectious disease   PAF (paroxysmal atrial fibrillation) (HCC)  Sepsis secondary to UTI related to chronic percutaneous nephrostomy - Sepsis criteria present on admission. Prior history of Pseudomonas infection. - Empiric vancomycin and cefepime started on admission, stopped IV vancomycin giving negative cultures - Urine culture growing Pseudomonas aeruginosa, cefepime > changed to ceftazidime across HD. Blood cultures 2: Negative. - As discussed with nephrology, complete total 14 days  course (start date 10/30/15) and then consider suppressive antibiotics. Outpatient follow-up with Dr. Era Bumpers, Urology. - Sepsis has resolved.  New ESRD - Presenting Creatinine elevated at 8.06 with a BUN of 82, outpatient creatinine around 6 - Tunneled HD cath present on admission. Maturing LUE AVF - Patient started hemodialysis on 11/02/2015-new to dialysis. EDW: 78.5 KG - Management per renal. Sees Dr. Florene Glen as outpatient.  Chronic obstructive uropathy (obstructive right hydronephrosis) with chronic PCN - Patient with right nephrostomy tube, changed 5/17 by IR. Outpatient follow-up with Dr. Era Bumpers and? IR.  Acute encephalopathy  - In the setting of infectious process, resolved,  Essential hypertension - Continued on metoprolol. Intermittent low blood pressures-discussed with cardiology, may change metoprolol to twice a day on days of HD.  GERD - Cont H2 blocker  Anemia of chronic renal disease - Aranesp per renal  Chest pain - Muscular skeletal feature, troponins negative 3, EKG nonacute, with repeat EKG, and 2-D echo: LVEF 55-60 percent, no wall motion abnormalities and grade 1 diastolic dysfunction.  Leukocytosis - Resolved  Paroxysmal A. fib with RVR - Prior history of A. fib in the context of acute illness, had been taken off amiodarone. Cardiomyopathy had resolved. - On 5/18, developed A. fib with RVR-relatively asymptomatic. Cardiology consulted. Transferred to stepdown unit. IV amiodarone started. Despite couple days of IV amiodarone, remained in A. fib. Amiodarone discontinued. Metoprolol titrated up. IV heparin bridging and Coumadin initiated. INR 1.3. Awaiting INR to become therapeutic >2 prior to discharge. - DC home when INR therapeutic. Cardiology will consider DCCV in 4 weeks. - TSH normal. INR: 1.9 on 11/10/15. Hopefully should be therapeutic by  tomorrow. - Cardiology will arrange outpatient cardiac f/u with Dr. Irish Lack and coumadin clinic at [facility].   DVT prophylaxis: IV heparin bridging and Coumadin Code Status: Full Family Communication: Discussed with patient. No family at bedside. Disposition Plan: DC home when medically stable & INR therapeutic, possibly 11/11/15.   Consultants:   Nephrology  Cardiology  Interventional radiology  Procedures:   HD  Technically successful exchange of right nephrostomy catheter under fluoroscopy by IR on 5/17  Antimicrobials:  IV cefepime 5/12 > 5/17  IV Fortaz across HD 5/18 >  IV vancomycin 5/12 > 5/14    Subjective: Denies complaints. No palpitations, chest pain or dyspnea.   Objective: Filed Vitals:   11/10/15 1100 11/10/15 1130 11/10/15 1200 11/10/15 1316  BP: 99/65 108/68 118/69 128/60  Pulse: 101 100 84 97  Temp:    97.8 F (36.6 C)  TempSrc:    Oral  Resp: 18 25 19 18   Height:      Weight:      SpO2:    98%    Intake/Output Summary (Last 24 hours) at 11/10/15 1328 Last data filed at 11/10/15 0708  Gross per 24 hour  Intake    480 ml  Output   1960 ml  Net  -1480 ml   Filed Weights   11/09/15 0401 11/10/15 0425 11/10/15 0821  Weight: 78.064 kg (172 lb 1.6 oz) 78.563 kg (173 lb 3.2 oz) 78.5 kg (173 lb 1 oz)    Examination:  General exam: Pleasant elderly female sitting up comfortably in chair this morning. Respiratory system: Clear to auscultation. Respiratory effort normal. Cardiovascular system: S1 & S2 heard, irregularly irregular. No JVD, murmurs. Telemetry: Reverted to sinus rhythm on 5/20 at 8:50 PM. Remains in sinus rhythm this morning. Gastrointestinal system: Abdomen is nondistended, soft and nontender.  BS+, right nephrostomy tube site without acute findings Central nervous system: Alert and oriented. No focal neurological deficits. Extremities: Symmetric 5 x 5 power. Skin: No rashes, lesions Psychiatry: Judgement and insight appear normal. Mood & affect appropriate.     Data Reviewed: I have personally reviewed following labs and  imaging studies  CBC:  Recent Labs Lab 11/07/15 0322 11/07/15 1528 11/08/15 0324 11/09/15 0340 11/10/15 0815  WBC 10.1 14.7* 9.0 9.2 7.5  HGB 9.4* 10.2* 9.4* 9.0* 9.0*  HCT 31.5* 34.0* 31.6* 30.1* 30.1*  MCV 86.8 85.9 87.3 88.3 89.9  PLT 237 152 159 157 [id number]   Basic Metabolic Panel:  Recent Labs Lab 11/04/15 0515 11/05/15 0718 11/07/15 1107 11/10/15 0815  NA 138 136 136 136  K 4.9 4.1 4.1 4.1  CL 100* 96* 98* 102  CO2 24 26 25 25   GLUCOSE 102* 145* 93 93  BUN 53* 30* 34* 37*  CREATININE 6.69* 4.88* 5.73* 6.95*  CALCIUM 9.0 8.7* 8.8*  8.8 9.2  PHOS  --  3.1 3.6 4.4   GFR: Estimated Creatinine Clearance: 7.5 mL/min (by C-G formula based on Cr of 6.95). Liver Function Tests:  Recent Labs Lab 11/05/15 0718 11/07/15 1107 11/10/15 0815  ALBUMIN 2.2* 2.2* 2.3*   No results for input(s): LIPASE, AMYLASE in the last 168 hours. No results for input(s): AMMONIA in the last 168 hours. Coagulation Profile:  Recent Labs Lab 11/07/15 0322 11/08/15 0324 11/09/15 0340 11/10/15 1143  INR 1.21 1.30 1.39 1.90*   Cardiac Enzymes: No results for input(s): CKTOTAL, CKMB, CKMBINDEX, TROPONINI in the last 168 hours. BNP (last 3 results) No results for input(s): PROBNP in the last 8760  hours. HbA1C: No results for input(s): HGBA1C in the last 72 hours. CBG: No results for input(s): GLUCAP in the last 168 hours. Lipid Profile: No results for input(s): CHOL, HDL, LDLCALC, TRIG, CHOLHDL, LDLDIRECT in the last 72 hours. Thyroid Function Tests: No results for input(s): TSH, T4TOTAL, FREET4, T3FREE, THYROIDAB in the last 72 hours. Anemia Panel: No results for input(s): VITAMINB12, FOLATE, FERRITIN, TIBC, IRON, RETICCTPCT in the last 72 hours. Urine analysis:    Component Value Date/Time   COLORURINE YELLOW 10/31/2015 0034   APPEARANCEUR TURBID* 10/31/2015 0034   LABSPEC 1.011 10/31/2015 0034   PHURINE 6.0 10/31/2015 0034   GLUCOSEU NEGATIVE 10/31/2015 0034   HGBUR  LARGE* 10/31/2015 0034   BILIRUBINUR NEGATIVE 10/31/2015 0034   BILIRUBINUR neg 09/18/2014 0920   KETONESUR NEGATIVE 10/31/2015 0034   PROTEINUR 100* 10/31/2015 0034   PROTEINUR 2+ 09/18/2014 0920   UROBILINOGEN 0.2 05/02/2015 2020   UROBILINOGEN negative 09/18/2014 0920   NITRITE NEGATIVE 10/31/2015 0034   NITRITE neg 09/18/2014 0920   LEUKOCYTESUR LARGE* 10/31/2015 0034   Sepsis Labs: No results for input(s): PROCALCITON, LATICACIDVEN in the last 168 hours.  No results found for this or any previous visit (from the past 240 hour(s)).       Radiology Studies: No results found.      Scheduled Meds: . acetaminophen      . cefTAZidime (FORTAZ)  IV  2 g Intravenous Q T,Th,Sa-HD  . darbepoetin (ARANESP) injection - DIALYSIS  60 mcg Intravenous Q Sat-HD  . famotidine  20 mg Oral Daily  . feeding supplement (NEPRO CARB STEADY)  237 mL Oral TID BM  . latanoprost  1 drop Both Eyes QHS  . metoprolol tartrate  25 mg Oral 3 times per day on Sun Mon Wed Fri   And  . metoprolol tartrate  25 mg Oral 2 times per day on Tue Thu Sat  . saccharomyces boulardii  250 mg Oral BID  . timolol  1 drop Both Eyes q morning - 10a  . triamcinolone  1 application Topical BID  . Warfarin - Pharmacist Dosing Inpatient   Does not apply q1800   Continuous Infusions: . heparin 1,350 Units/hr (11/10/15 0142)     LOS: 10 days    Lyrical Sowle, MD [facility] Pager [phone]  If 7PM-7AM, please contact night-coverage [email or web address] Password [email or web address] 11/10/2015, 1:28 PM

## 2015-11-11 DIAGNOSIS — N179 Acute kidney failure, unspecified: Secondary | ICD-10-CM | POA: Insufficient documentation

## 2015-11-11 DIAGNOSIS — N189 Chronic kidney disease, unspecified: Secondary | ICD-10-CM

## 2015-11-11 LAB — PROTIME-INR
INR: 2.64 — ABNORMAL HIGH (ref 0.00–1.49)
Prothrombin Time: 27.8 seconds — ABNORMAL HIGH (ref 11.6–15.2)

## 2015-11-11 LAB — CBC
HCT: 30.8 % — ABNORMAL LOW (ref 36.0–46.0)
Hemoglobin: 9.3 g/dL — ABNORMAL LOW (ref 12.0–15.0)
MCH: 26.5 pg (ref 26.0–34.0)
MCHC: 30.2 g/dL (ref 30.0–36.0)
MCV: 87.7 fL (ref 78.0–100.0)
Platelets: 161 10*3/uL (ref 150–400)
RBC: 3.51 MIL/uL — ABNORMAL LOW (ref 3.87–5.11)
RDW: 18.2 % — ABNORMAL HIGH (ref 11.5–15.5)
WBC: 7.6 10*3/uL (ref 4.0–10.5)

## 2015-11-11 LAB — HEPARIN LEVEL (UNFRACTIONATED): Heparin Unfractionated: >2.2 IU/mL — ABNORMAL HIGH (ref 0.30–0.70)

## 2015-11-11 MED ORDER — RENA-VITE PO TABS
1.0000 | ORAL_TABLET | Freq: Every day | ORAL | Status: DC
Start: 2015-11-11 — End: 2015-11-11

## 2015-11-11 MED ORDER — CEFTAZIDIME 2 G IJ SOLR: 2.0000 g | INTRAMUSCULAR | Status: DC

## 2015-11-11 MED ORDER — WARFARIN SODIUM 5 MG PO TABS: 5.0000 mg | ORAL_TABLET | Freq: Once | ORAL | Status: DC

## 2015-11-11 MED ORDER — GUAIFENESIN-DM 100-10 MG/5ML PO SYRP: 5.0000 mL | ORAL_SOLUTION | Freq: Four times a day (QID) | ORAL | Status: DC | PRN

## 2015-11-11 MED ORDER — METOPROLOL TARTRATE 25 MG PO TABS
25.0000 mg | ORAL_TABLET | Freq: Two times a day (BID) | ORAL | Status: DC
Start: 2015-11-11 — End: 2016-02-24

## 2015-11-11 NOTE — Discharge Summary (Addendum)
Physician Discharge Summary  [name] [id number] DOB: 08-Aug-1945 DOA: 10/30/2015  PCP: [name], MD  Admit date: 10/30/2015 Discharge date: 11/11/2015  Recommendations for Outpatient Follow-up:  1. Pt will need to follow up with PCP in 1-2 weeks post discharge 2. Please see instruction on ABX continuation below under medication section   Discharge Diagnoses:  Principal Problem:   Atrial fibrillation with RVR ([place]) Active Problems:   End stage renal disease (HCC)   Anemia of renal disease  Discharge Condition: Stable  Diet recommendation: renal diet discussed with pt   History of present illness:  70 y.o. female with medical history significant of CKD stage V, nephrolithiasis, HTN, CAD, anemia of chronic disease, s/p right nephrostomy tube; who presented with complaints of 1 day history of confusion and fever. Patient noted to have WBC of 20k with UA suggestive of UTI. Patient was started on IVF and antibiotics. She is followed by Dr. Florene Glen as outpatient for stage 5 CKD with fistula placed in the past few weeks, already had tunneled dialysis catheter. Presenting Cr noted to be 8.06 with BUN of 82, no significant improvement with IV fluids, seen by renal, started hemodialysis on 5/15, was transferred to stepdown 5/15 for an episode of chest pain post hemodialysis, with negative workup so far. Patient was about to be discharged on 5/18, then went into A. fib with RVR.  Assessment & Plan:  Sepsis secondary to UTI related to chronic percutaneous nephrostomy - Sepsis criteria present on admission. Prior history of Pseudomonas infection. - Empiric vancomycin and cefepime started on admission, stopped IV vancomycin given negative cultures - Urine culture growing Pseudomonas aeruginosa, cefepime > changed to ceftazidime across HD. Blood cultures 2: Negative. - Dr. Algis Liming discussed with nephrology, pt complete total 14 days course and then consider suppressive  antibiotics. Outpatient follow-up with Dr. Era Bumpers, Urology. - Sepsis has resolved.  New ESRD - Tunneled HD cath present on admission. Maturing LUE AVF - Patient started hemodialysis on 11/02/2015-new to dialysis. EDW: 78.5 KG - continue with HD outpatient and next session is tomorrow AM  Chronic obstructive uropathy (obstructive right hydronephrosis) with chronic PCN - Patient with right nephrostomy tube, changed 5/17 by IR. Outpatient follow-up with Dr. Era Bumpers and? IR.  Acute encephalopathy secondary to Pseudomonas UTI Sepsis  - resolved   Essential hypertension - Continue home medical regimen   GERD - Cont H2 blocker  Anemia of chronic renal disease - Aranesp per renal  Chest pain - Muscular skeletal feature - resolved   Leukocytosis - Resolved  Paroxysmal A. fib with RVR - Prior history of A. fib in the context of acute illness, had been taken off amiodarone. Cardiomyopathy had resolved. - On 5/18, developed A. fib with RVR-relatively asymptomatic. Cardiology consulted. Transferred to stepdown unit. IV amiodarone started. Despite couple days of IV amiodarone, remained in A. fib. Amiodarone discontinued. Metoprolol titrated up. IV heparin bridging and Coumadin - INR therapeutic and pt wants to go home today  - Cardiology will arrange outpatient cardiac f/u with Dr. Irish Lack and coumadin clinic at [facility].   Code Status: Full Family Communication: Discussed with patient. Pt declined my offer to update family, my phone number provided for additional questions  Disposition Plan: DC home    Consultants:   Nephrology  Cardiology  Interventional radiology  Procedures:   HD  Technically successful exchange of right nephrostomy catheter under fluoroscopy by IR on 5/17  Antimicrobials:  IV cefepime 5/12 > 5/17  IV Fortaz across HD 5/18 >  IV vancomycin 5/12 > 5/14   Procedures/Studies: Dg Chest Port 1 View  10/31/2015  Hiatal hernia.  Linear  scarring or atelectasis in the left base.   Ir Nephrostomy Exchange Right  11/04/2015   Technically successful exchange of right nephrostomy catheter under fluoroscopy   Discharge Exam: Filed Vitals:   11/11/15 0448 11/11/15 1131  BP: 119/55 103/51  Pulse: 79 88  Temp: 98.3 F (36.8 C)   Resp: 18    Filed Vitals:   11/10/15 1316 11/10/15 2126 11/11/15 0448 11/11/15 1131  BP: 128/60 109/59 119/55 103/51  Pulse: 97 88 79 88  Temp: 97.8 F (36.6 C) 98.1 F (36.7 C) 98.3 F (36.8 C)   TempSrc: Oral Oral Oral   Resp: 18 18 18    Height:      Weight:   77.701 kg (171 lb 4.8 oz)   SpO2: 98% 100% 97%     General: Pt is alert, follows commands appropriately, not in acute distress Cardiovascular: Regular rate and rhythm, no rubs, no gallops Respiratory: Clear to auscultation bilaterally, no wheezing, no crackles, no rhonchi Abdominal: Soft, non tender, non distended, bowel sounds +, no guarding  Discharge Instructions  Discharge Instructions    Diet - low sodium heart healthy    Complete by:  As directed      Increase activity slowly    Complete by:  As directed             Medication List    STOP taking these medications        fluconazole 150 MG tablet  Commonly known as:  DIFLUCAN     sodium bicarbonate 650 MG tablet      TAKE these medications        acetaminophen 500 MG tablet  Commonly known as:  TYLENOL  Take 1,000 mg by mouth every 6 (six) hours as needed for moderate pain or headache.     bimatoprost 0.01 % Soln  Commonly known as:  LUMIGAN  Place 1 drop into both eyes at bedtime.     cefTAZidime 2 g in dextrose 5 % 50 mL  Inject 2 g into the vein Every Tuesday,Thursday,and Saturday with dialysis. 2 gm IV via HD in Tu-Thurs-Sat. Start date was May 18th, 2017 and continue until June 17th, 2017. This will complete 14 days therapy. After that continuation of possible suppressive Antibiotic therapy to be discuss with primary care provider.     feeding  supplement (NEPRO CARB STEADY) Liqd  Take 237 mLs by mouth daily.     guaiFENesin-dextromethorphan 100-10 MG/5ML syrup  Commonly known as:  ROBITUSSIN DM  Take 5 mLs by mouth every 6 (six) hours as needed for cough.     metoprolol tartrate 25 MG tablet  Commonly known as:  LOPRESSOR  Take 1 tablet (25 mg total) by mouth 2 (two) times daily.     nystatin cream  Commonly known as:  MYCOSTATIN  Apply 1 application topically 2 (two) times daily. Apply to affected area BID for up to 7 days.     ranitidine 150 MG tablet  Commonly known as:  ZANTAC  Take 150 mg by mouth 2 (two) times daily as needed for heartburn.     saccharomyces boulardii 250 MG capsule  Commonly known as:  FLORASTOR  Take 1 capsule (250 mg total) by mouth 2 (two) times daily.     sodium chloride 0.65 % Soln nasal spray  Commonly known as:  OCEAN  Place 1 spray into both  nostrils 3 (three) times daily as needed for congestion.     timolol 0.5 % ophthalmic solution  Commonly known as:  BETIMOL  Place 1 drop into both eyes every morning.     traMADol 50 MG tablet  Commonly known as:  ULTRAM  Take 1 tablet (50 mg total) by mouth every 6 (six) hours as needed.     triamcinolone 0.025 % ointment  Commonly known as:  KENALOG  Apply 1 application topically 2 (two) times daily.     warfarin 5 MG tablet  Commonly known as:  COUMADIN  Take 1 tablet (5 mg total) by mouth one time only at 6 PM.           Follow-up Information    Follow up with Larae Grooms, MD.   Specialties:  Cardiology, Radiology, Interventional Cardiology   Why:  Office will call you with your follow up appointments date and times.   Contact information:   [id number] [place] [address] [phone]       Follow up with [name], MD.   Specialty:  Internal Medicine   Contact information:   [place] [employer] [address] [place] [address] [phone]       Call Faye Ramsay, MD.    Specialty:  Internal Medicine   Why:  As needed call my cell phone [phone]   Contact information:   [address] [place] [address] [phone]        The results of significant diagnostics from this hospitalization (including imaging, microbiology, ancillary and laboratory) are listed below for reference.     Microbiology: No results found for this or any previous visit (from the past 240 hour(s)).   Labs: Basic Metabolic Panel:  Recent Labs Lab 11/05/15 0718 11/07/15 1107 11/10/15 0815  NA 136 136 136  K 4.1 4.1 4.1  CL 96* 98* 102  CO2 26 25 25   GLUCOSE 145* 93 93  BUN 30* 34* 37*  CREATININE 4.88* 5.73* 6.95*  CALCIUM 8.7* 8.8*  8.8 9.2  PHOS 3.1 3.6 4.4   Liver Function Tests:  Recent Labs Lab 11/05/15 0718 11/07/15 1107 11/10/15 0815  ALBUMIN 2.2* 2.2* 2.3*   CBC:  Recent Labs Lab 11/07/15 1528 11/08/15 0324 11/09/15 0340 11/10/15 0815 11/11/15 0330  WBC 14.7* 9.0 9.2 7.5 7.6  HGB 10.2* 9.4* 9.0* 9.0* 9.3*  HCT 34.0* 31.6* 30.1* 30.1* 30.8*  MCV 85.9 87.3 88.3 89.9 87.7  PLT 152 159 157 194 161    BNP (last 3 results)  Recent Labs  02/26/15 0530 05/23/15 0602 07/04/15 1014  BNP 35.3 37.2 183.0*    SIGNED: Time coordinating discharge: 30 minutes  MAGICK-Apostolos Blagg, MD  [facility] 11/11/2015, 1:49 PM Pager [phone]  If 7PM-7AM, please contact night-coverage [email or web address] Password [email or web address]

## 2015-11-11 NOTE — Progress Notes (Signed)
[facility] for warfarin Indication: atrial fibrillation  Allergies  Allergen Reactions  . Vicodin [Hydrocodone-Acetaminophen] Nausea And Vomiting  . Chlorhexidine Rash    Sunburn    rash  . Percocet [Oxycodone-Acetaminophen] Nausea And Vomiting    Patient Measurements: Height: 5\' 3"  (160 cm) Weight: 171 lb 4.8 oz (77.701 kg) IBW/kg (Calculated) : 52.4 Heparin Dosing Weight: 70kg  Vital Signs: Temp: 98.3 F (36.8 C) (05/24 0448) Temp Source: Oral (05/24 0448) BP: 119/55 mmHg (05/24 0448) Pulse Rate: 79 (05/24 0448)  Labs:  Recent Labs  11/09/15 0340 11/10/15 0815 11/10/15 1143 11/10/15 2138 11/11/15 0330  HGB 9.0* 9.0*  --   --  9.3*  HCT 30.1* 30.1*  --   --  30.8*  PLT 157 194  --   --  161  LABPROT 17.1*  --  21.7*  --  27.8*  INR 1.39  --  1.90*  --  2.64*  HEPARINUNFRC 0.40 >2.20* 0.29* 0.58 >2.20*  CREATININE  --  6.95*  --   --   --     Estimated Creatinine Clearance: 7.4 mL/min (by C-G formula based on Cr of 6.95).  Assessment: 71 year old female, new ESRD on HD patient, with AFib with RVR. Pharmacy consulted to start warfarin and heparin infusion. HL was supratherapeutic this morning but her INR is therapeutic, therefore will discontinue heparin gtt. INR 2.6.    Goal of Therapy:  INR 2-3 Monitor platelets by anticoagulation protocol: Yes    Plan:  - Warfarin 5 mg po x1 - Daily INR    Hughes Better, PharmD, BCPS Clinical Pharmacist 11/11/2015 9:31 AM

## 2015-11-11 NOTE — Progress Notes (Signed)
Patient discharged home with daughter. IV was dc'd and was intact. Medications and discharge directions were educated on and they stated that they understood.

## 2015-11-11 NOTE — Care Management Important Message (Signed)
Important Message  Patient Details  Name: [name] MRN: [id number] Date of Birth: December 08, 1945   Medicare Important Message Given:  Yes    Loann Quill 11/11/2015, 11:18 AM

## 2015-11-11 NOTE — Progress Notes (Signed)
Patient ID: [name], female   DOB: 01-22-1946, 70 y.o.   MRN: [id number]  [facility] Progress Note   Assessment/ Plan:   1. Pseudomonas UTI/sepsis with chronic right PCN: On ceftazidime to be continued at hemodialysis until 11/14/15. Markers of infection appear to be improving-afebrile and with improvement of leukocytosis. She will need chronic suppressive antimicrobial therapy with indwelling percutaneous nephrostomy and thereafter, evaluation by urology for ability to discontinue nephrostomy. 2. ESRD: Clinically appears to be euvolemic and without significant uremic signs. She is to undergo hemodialysis on a Tuesday/Thursday/Saturday schedule at [facility] beginning tomorrow if she is discharged today. 3. Anemia: Low hemoglobin levels, no overt loss, continue to monitor on ESA. Status post intravenous iron. 4. CKD-MBD: Calcium/phosphorus currently at acceptable levels-not on binders. May need this in the near future. 5. Nutrition: Low albumin likely from chronic malnutrition/chronic kidney disease as well as chronic inflammatory complex from PCN/recurrent UTI. 6. Hypertension: Blood pressures currently acceptable, continue monitoring current therapy  Subjective:   She had atrial fibrillation with some RVR at hemodialysis yesterday. INR therapeutic this morning at 2.6. Possibly to be discharged later today    Objective:   BP 119/55 mmHg  Pulse 79  Temp(Src) 98.3 F (36.8 C) (Oral)  Resp 18  Ht 5\' 3"  (1.6 m)  Wt 77.701 kg (171 lb 4.8 oz)  BMI 30.35 kg/m2  SpO2 97%  LMP 01/19/1992 (Approximate)  Physical Exam: [id number] resting in bed [id number] Irregularly irregular, normal rate, S1 and S2 normal Resp: Clear to auscultation bilaterally, no rales Abd: Soft, flat, nontender Ext: No lower extremity edema. With right IJ TDC and maturing left BBF.  Labs: BMET  Recent Labs Lab 11/05/15 0718 11/07/15 1107 11/10/15 0815  NA 136  136 136  K 4.1 4.1 4.1  CL 96* 98* 102  CO2 26 25 25   GLUCOSE 145* 93 93  BUN 30* 34* 37*  CREATININE 4.88* 5.73* 6.95*  CALCIUM 8.7* 8.8*  8.8 9.2  PHOS 3.1 3.6 4.4   CBC  Recent Labs Lab 11/08/15 0324 11/09/15 0340 11/10/15 0815 11/11/15 0330  WBC 9.0 9.2 7.5 7.6  HGB 9.4* 9.0* 9.0* 9.3*  HCT 31.6* 30.1* 30.1* 30.8*  MCV 87.3 88.3 89.9 87.7  PLT 159 157 194 161   Medications:    . cefTAZidime (FORTAZ)  IV  2 g Intravenous Q T,Th,Sa-HD  . darbepoetin (ARANESP) injection - DIALYSIS  60 mcg Intravenous Q Sat-HD  . famotidine  20 mg Oral Daily  . feeding supplement (NEPRO CARB STEADY)  237 mL Oral TID BM  . latanoprost  1 drop Both Eyes QHS  . metoprolol tartrate  25 mg Oral 3 times per day on Sun Mon Wed Fri   And  . metoprolol tartrate  25 mg Oral 2 times per day on Tue Thu Sat  . saccharomyces boulardii  250 mg Oral BID  . timolol  1 drop Both Eyes q morning - 10a  . triamcinolone  1 application Topical BID  . warfarin  5 mg Oral ONCE-1800  . Warfarin - Pharmacist Dosing Inpatient   Does not apply [id number]   Elmarie Shiley, MD 11/11/2015, 9:47 AM

## 2015-11-12 DIAGNOSIS — N390 Urinary tract infection, site not specified: Secondary | ICD-10-CM | POA: Diagnosis not present

## 2015-11-12 DIAGNOSIS — T8249XA Other complication of vascular dialysis catheter, initial encounter: Secondary | ICD-10-CM | POA: Diagnosis not present

## 2015-11-12 DIAGNOSIS — T82818A Embolism of vascular prosthetic devices, implants and grafts, initial encounter: Secondary | ICD-10-CM | POA: Diagnosis not present

## 2015-11-12 DIAGNOSIS — D509 Iron deficiency anemia, unspecified: Secondary | ICD-10-CM | POA: Diagnosis not present

## 2015-11-12 DIAGNOSIS — Z111 Encounter for screening for respiratory tuberculosis: Secondary | ICD-10-CM | POA: Diagnosis not present

## 2015-11-12 DIAGNOSIS — N186 End stage renal disease: Secondary | ICD-10-CM | POA: Diagnosis not present

## 2015-11-12 DIAGNOSIS — R51 Headache: Secondary | ICD-10-CM | POA: Diagnosis not present

## 2015-11-12 DIAGNOSIS — D688 Other specified coagulation defects: Secondary | ICD-10-CM | POA: Diagnosis not present

## 2015-11-13 ENCOUNTER — Ambulatory Visit (INDEPENDENT_AMBULATORY_CARE_PROVIDER_SITE_OTHER): Payer: Medicare Other | Admitting: *Deleted

## 2015-11-13 DIAGNOSIS — Z5181 Encounter for therapeutic drug level monitoring: Secondary | ICD-10-CM

## 2015-11-13 DIAGNOSIS — I480 Paroxysmal atrial fibrillation: Secondary | ICD-10-CM | POA: Diagnosis not present

## 2015-11-13 DIAGNOSIS — Z7189 Other specified counseling: Secondary | ICD-10-CM | POA: Insufficient documentation

## 2015-11-13 DIAGNOSIS — I4891 Unspecified atrial fibrillation: Secondary | ICD-10-CM

## 2015-11-13 DIAGNOSIS — Z515 Encounter for palliative care: Secondary | ICD-10-CM | POA: Insufficient documentation

## 2015-11-13 LAB — POCT INR: INR: 3.3

## 2015-11-13 NOTE — Progress Notes (Signed)

## 2015-11-13 NOTE — Patient Instructions (Signed)

## 2015-11-18 DIAGNOSIS — Z992 Dependence on renal dialysis: Secondary | ICD-10-CM | POA: Diagnosis not present

## 2015-11-18 DIAGNOSIS — I129 Hypertensive chronic kidney disease with stage 1 through stage 4 chronic kidney disease, or unspecified chronic kidney disease: Secondary | ICD-10-CM | POA: Diagnosis not present

## 2015-11-18 DIAGNOSIS — N186 End stage renal disease: Secondary | ICD-10-CM | POA: Diagnosis not present

## 2015-11-19 ENCOUNTER — Encounter (HOSPITAL_COMMUNITY): Payer: Self-pay | Admitting: Emergency Medicine

## 2015-11-19 ENCOUNTER — Emergency Department (HOSPITAL_COMMUNITY): Payer: Medicare Other

## 2015-11-19 DIAGNOSIS — Z992 Dependence on renal dialysis: Secondary | ICD-10-CM

## 2015-11-19 DIAGNOSIS — H409 Unspecified glaucoma: Secondary | ICD-10-CM | POA: Diagnosis present

## 2015-11-19 DIAGNOSIS — Z961 Presence of intraocular lens: Secondary | ICD-10-CM | POA: Diagnosis present

## 2015-11-19 DIAGNOSIS — Z7901 Long term (current) use of anticoagulants: Secondary | ICD-10-CM

## 2015-11-19 DIAGNOSIS — Z9109 Other allergy status, other than to drugs and biological substances: Secondary | ICD-10-CM

## 2015-11-19 DIAGNOSIS — D688 Other specified coagulation defects: Secondary | ICD-10-CM | POA: Diagnosis not present

## 2015-11-19 DIAGNOSIS — K449 Diaphragmatic hernia without obstruction or gangrene: Secondary | ICD-10-CM | POA: Diagnosis present

## 2015-11-19 DIAGNOSIS — Z9842 Cataract extraction status, left eye: Secondary | ICD-10-CM

## 2015-11-19 DIAGNOSIS — I2510 Atherosclerotic heart disease of native coronary artery without angina pectoris: Secondary | ICD-10-CM | POA: Diagnosis present

## 2015-11-19 DIAGNOSIS — Y95 Nosocomial condition: Secondary | ICD-10-CM | POA: Diagnosis present

## 2015-11-19 DIAGNOSIS — J90 Pleural effusion, not elsewhere classified: Principal | ICD-10-CM | POA: Diagnosis present

## 2015-11-19 DIAGNOSIS — I313 Pericardial effusion (noninflammatory): Secondary | ICD-10-CM | POA: Diagnosis not present

## 2015-11-19 DIAGNOSIS — I480 Paroxysmal atrial fibrillation: Secondary | ICD-10-CM | POA: Diagnosis present

## 2015-11-19 DIAGNOSIS — Z90710 Acquired absence of both cervix and uterus: Secondary | ICD-10-CM

## 2015-11-19 DIAGNOSIS — J189 Pneumonia, unspecified organism: Secondary | ICD-10-CM | POA: Diagnosis present

## 2015-11-19 DIAGNOSIS — R079 Chest pain, unspecified: Secondary | ICD-10-CM | POA: Diagnosis not present

## 2015-11-19 DIAGNOSIS — Z9841 Cataract extraction status, right eye: Secondary | ICD-10-CM

## 2015-11-19 DIAGNOSIS — I132 Hypertensive heart and chronic kidney disease with heart failure and with stage 5 chronic kidney disease, or end stage renal disease: Secondary | ICD-10-CM | POA: Diagnosis present

## 2015-11-19 DIAGNOSIS — N186 End stage renal disease: Secondary | ICD-10-CM | POA: Diagnosis present

## 2015-11-19 DIAGNOSIS — D631 Anemia in chronic kidney disease: Secondary | ICD-10-CM | POA: Diagnosis not present

## 2015-11-19 DIAGNOSIS — I319 Disease of pericardium, unspecified: Secondary | ICD-10-CM | POA: Diagnosis not present

## 2015-11-19 DIAGNOSIS — I10 Essential (primary) hypertension: Secondary | ICD-10-CM | POA: Diagnosis not present

## 2015-11-19 DIAGNOSIS — R091 Pleurisy: Secondary | ICD-10-CM | POA: Diagnosis not present

## 2015-11-19 DIAGNOSIS — T8249XA Other complication of vascular dialysis catheter, initial encounter: Secondary | ICD-10-CM | POA: Diagnosis not present

## 2015-11-19 DIAGNOSIS — R0789 Other chest pain: Secondary | ICD-10-CM | POA: Diagnosis not present

## 2015-11-19 DIAGNOSIS — Z936 Other artificial openings of urinary tract status: Secondary | ICD-10-CM | POA: Diagnosis not present

## 2015-11-19 DIAGNOSIS — R51 Headache: Secondary | ICD-10-CM | POA: Diagnosis not present

## 2015-11-19 DIAGNOSIS — I5032 Chronic diastolic (congestive) heart failure: Secondary | ICD-10-CM | POA: Diagnosis present

## 2015-11-19 DIAGNOSIS — I252 Old myocardial infarction: Secondary | ICD-10-CM

## 2015-11-19 DIAGNOSIS — D509 Iron deficiency anemia, unspecified: Secondary | ICD-10-CM | POA: Diagnosis not present

## 2015-11-19 DIAGNOSIS — R071 Chest pain on breathing: Secondary | ICD-10-CM | POA: Diagnosis not present

## 2015-11-19 DIAGNOSIS — E785 Hyperlipidemia, unspecified: Secondary | ICD-10-CM | POA: Diagnosis not present

## 2015-11-19 DIAGNOSIS — I517 Cardiomegaly: Secondary | ICD-10-CM | POA: Diagnosis not present

## 2015-11-19 DIAGNOSIS — I482 Chronic atrial fibrillation: Secondary | ICD-10-CM | POA: Diagnosis not present

## 2015-11-19 DIAGNOSIS — E876 Hypokalemia: Secondary | ICD-10-CM | POA: Diagnosis not present

## 2015-11-19 DIAGNOSIS — R0781 Pleurodynia: Secondary | ICD-10-CM | POA: Diagnosis not present

## 2015-11-19 DIAGNOSIS — R0902 Hypoxemia: Secondary | ICD-10-CM | POA: Diagnosis not present

## 2015-11-19 DIAGNOSIS — I739 Peripheral vascular disease, unspecified: Secondary | ICD-10-CM | POA: Diagnosis present

## 2015-11-19 DIAGNOSIS — J948 Other specified pleural conditions: Secondary | ICD-10-CM | POA: Diagnosis not present

## 2015-11-19 DIAGNOSIS — Z885 Allergy status to narcotic agent status: Secondary | ICD-10-CM

## 2015-11-19 DIAGNOSIS — N139 Obstructive and reflux uropathy, unspecified: Secondary | ICD-10-CM | POA: Diagnosis present

## 2015-11-19 DIAGNOSIS — K219 Gastro-esophageal reflux disease without esophagitis: Secondary | ICD-10-CM | POA: Diagnosis present

## 2015-11-19 DIAGNOSIS — R0602 Shortness of breath: Secondary | ICD-10-CM | POA: Diagnosis not present

## 2015-11-19 DIAGNOSIS — Z79899 Other long term (current) drug therapy: Secondary | ICD-10-CM

## 2015-11-19 LAB — CBC
HCT: 28.9 % — ABNORMAL LOW (ref 36.0–46.0)
Hemoglobin: 8.8 g/dL — ABNORMAL LOW (ref 12.0–15.0)
MCH: 26.7 pg (ref 26.0–34.0)
MCHC: 30.4 g/dL (ref 30.0–36.0)
MCV: 87.6 fL (ref 78.0–100.0)
Platelets: 274 10*3/uL (ref 150–400)
RBC: 3.3 MIL/uL — ABNORMAL LOW (ref 3.87–5.11)
RDW: 16.7 % — ABNORMAL HIGH (ref 11.5–15.5)
WBC: 7.5 10*3/uL (ref 4.0–10.5)

## 2015-11-19 LAB — BASIC METABOLIC PANEL
Anion gap: 8 (ref 5–15)
BUN: 19 mg/dL (ref 6–20)
CO2: 25 mmol/L (ref 22–32)
Calcium: 7.9 mg/dL — ABNORMAL LOW (ref 8.9–10.3)
Chloride: 99 mmol/L — ABNORMAL LOW (ref 101–111)
Creatinine, Ser: 4.2 mg/dL — ABNORMAL HIGH (ref 0.44–1.00)
GFR calc Af Amer: 11 mL/min — ABNORMAL LOW (ref >60)
GFR calc non Af Amer: 10 mL/min — ABNORMAL LOW (ref >60)
Glucose, Bld: 123 mg/dL — ABNORMAL HIGH (ref 65–99)
Potassium: 3.6 mmol/L (ref 3.5–5.1)
Sodium: 132 mmol/L — ABNORMAL LOW (ref 135–145)

## 2015-11-19 NOTE — ED Notes (Signed)
Pt states after her dialysis today she started having a centralized chest pressure and shortness of breath. Pt states she also felt some palpations and then took some lopressor that made her feel a little better.

## 2015-11-20 ENCOUNTER — Encounter: Payer: Self-pay | Admitting: Vascular Surgery

## 2015-11-20 ENCOUNTER — Encounter (HOSPITAL_COMMUNITY): Payer: Self-pay | Admitting: Internal Medicine

## 2015-11-20 ENCOUNTER — Inpatient Hospital Stay (HOSPITAL_COMMUNITY)
Admission: EM | Admit: 2015-11-20 | Discharge: 2015-11-23 | DRG: 186 | Disposition: A | Discharge status: Home / Self Care | Payer: Medicare Other | Attending: Internal Medicine | Admitting: Internal Medicine

## 2015-11-20 ENCOUNTER — Observation Stay (HOSPITAL_COMMUNITY): Payer: Medicare Other

## 2015-11-20 ENCOUNTER — Encounter: Payer: Medicare Other | Admitting: Vascular Surgery

## 2015-11-20 DIAGNOSIS — I480 Paroxysmal atrial fibrillation: Secondary | ICD-10-CM | POA: Diagnosis present

## 2015-11-20 DIAGNOSIS — I3139 Other pericardial effusion (noninflammatory): Secondary | ICD-10-CM | POA: Insufficient documentation

## 2015-11-20 DIAGNOSIS — I132 Hypertensive heart and chronic kidney disease with heart failure and with stage 5 chronic kidney disease, or end stage renal disease: Secondary | ICD-10-CM | POA: Diagnosis not present

## 2015-11-20 DIAGNOSIS — R079 Chest pain, unspecified: Secondary | ICD-10-CM

## 2015-11-20 DIAGNOSIS — I10 Essential (primary) hypertension: Secondary | ICD-10-CM | POA: Diagnosis present

## 2015-11-20 DIAGNOSIS — J90 Pleural effusion, not elsewhere classified: Secondary | ICD-10-CM | POA: Insufficient documentation

## 2015-11-20 DIAGNOSIS — R071 Chest pain on breathing: Secondary | ICD-10-CM | POA: Diagnosis not present

## 2015-11-20 DIAGNOSIS — R091 Pleurisy: Secondary | ICD-10-CM | POA: Insufficient documentation

## 2015-11-20 DIAGNOSIS — J948 Other specified pleural conditions: Secondary | ICD-10-CM

## 2015-11-20 DIAGNOSIS — N189 Chronic kidney disease, unspecified: Secondary | ICD-10-CM | POA: Diagnosis present

## 2015-11-20 DIAGNOSIS — J189 Pneumonia, unspecified organism: Secondary | ICD-10-CM | POA: Diagnosis not present

## 2015-11-20 DIAGNOSIS — I319 Disease of pericardium, unspecified: Secondary | ICD-10-CM | POA: Diagnosis not present

## 2015-11-20 DIAGNOSIS — N186 End stage renal disease: Secondary | ICD-10-CM | POA: Diagnosis present

## 2015-11-20 DIAGNOSIS — R0902 Hypoxemia: Secondary | ICD-10-CM | POA: Insufficient documentation

## 2015-11-20 DIAGNOSIS — D631 Anemia in chronic kidney disease: Secondary | ICD-10-CM | POA: Diagnosis present

## 2015-11-20 DIAGNOSIS — Z9889 Other specified postprocedural states: Secondary | ICD-10-CM | POA: Insufficient documentation

## 2015-11-20 DIAGNOSIS — I313 Pericardial effusion (noninflammatory): Secondary | ICD-10-CM | POA: Insufficient documentation

## 2015-11-20 DIAGNOSIS — I517 Cardiomegaly: Secondary | ICD-10-CM | POA: Diagnosis not present

## 2015-11-20 HISTORY — DX: Chronic diastolic (congestive) heart failure: I50.32

## 2015-11-20 HISTORY — DX: Encounter for observation for other suspected diseases and conditions ruled out: Z03.89

## 2015-11-20 HISTORY — DX: Paroxysmal atrial fibrillation: I48.0

## 2015-11-20 HISTORY — DX: Chest pain, unspecified: R07.9

## 2015-11-20 LAB — CBC WITH DIFFERENTIAL/PLATELET
Basophils Absolute: 0 10*3/uL (ref 0.0–0.1)
Basophils Relative: 1 %
Eosinophils Absolute: 0.1 10*3/uL (ref 0.0–0.7)
Eosinophils Relative: 1 %
HCT: 28.9 % — ABNORMAL LOW (ref 36.0–46.0)
Hemoglobin: 8.6 g/dL — ABNORMAL LOW (ref 12.0–15.0)
Lymphocytes Relative: 20 %
Lymphs Abs: 1.1 10*3/uL (ref 0.7–4.0)
MCH: 26.6 pg (ref 26.0–34.0)
MCHC: 29.8 g/dL — ABNORMAL LOW (ref 30.0–36.0)
MCV: 89.5 fL (ref 78.0–100.0)
Monocytes Absolute: 0.6 10*3/uL (ref 0.1–1.0)
Monocytes Relative: 11 %
Neutro Abs: 3.5 10*3/uL (ref 1.7–7.7)
Neutrophils Relative %: 67 %
Platelets: 242 10*3/uL (ref 150–400)
RBC: 3.23 MIL/uL — ABNORMAL LOW (ref 3.87–5.11)
RDW: 16.7 % — ABNORMAL HIGH (ref 11.5–15.5)
WBC: 5.3 10*3/uL (ref 4.0–10.5)

## 2015-11-20 LAB — COMPREHENSIVE METABOLIC PANEL
ALT: 14 U/L (ref 14–54)
AST: 11 U/L — ABNORMAL LOW (ref 15–41)
Albumin: 2.3 g/dL — ABNORMAL LOW (ref 3.5–5.0)
Alkaline Phosphatase: 63 U/L (ref 38–126)
Anion gap: 10 (ref 5–15)
BUN: 22 mg/dL — ABNORMAL HIGH (ref 6–20)
CO2: 26 mmol/L (ref 22–32)
Calcium: 8.5 mg/dL — ABNORMAL LOW (ref 8.9–10.3)
Chloride: 98 mmol/L — ABNORMAL LOW (ref 101–111)
Creatinine, Ser: 5.02 mg/dL — ABNORMAL HIGH (ref 0.44–1.00)
GFR calc Af Amer: 9 mL/min — ABNORMAL LOW (ref >60)
GFR calc non Af Amer: 8 mL/min — ABNORMAL LOW (ref >60)
Glucose, Bld: 94 mg/dL (ref 65–99)
Potassium: 3.6 mmol/L (ref 3.5–5.1)
Sodium: 134 mmol/L — ABNORMAL LOW (ref 135–145)
Total Bilirubin: 0.4 mg/dL (ref 0.3–1.2)
Total Protein: 5.7 g/dL — ABNORMAL LOW (ref 6.5–8.1)

## 2015-11-20 LAB — URINALYSIS, ROUTINE W REFLEX MICROSCOPIC
Bilirubin Urine: NEGATIVE
Glucose, UA: NEGATIVE mg/dL
Ketones, ur: NEGATIVE mg/dL
Nitrite: NEGATIVE
Protein, ur: 100 mg/dL — AB
Specific Gravity, Urine: 1.007 (ref 1.005–1.030)
pH: 7.5 (ref 5.0–8.0)

## 2015-11-20 LAB — BODY FLUID CELL COUNT WITH DIFFERENTIAL
Eos, Fluid: 0 %
Lymphs, Fluid: 25 %
Monocyte-Macrophage-Serous Fluid: 62 % (ref 50–90)
Neutrophil Count, Fluid: 13 % (ref 0–25)
Other Cells, Fluid: 0 %
Total Nucleated Cell Count, Fluid: 610 cu mm (ref 0–1000)

## 2015-11-20 LAB — TROPONIN I
Troponin I: <0.03 ng/mL (ref <0.031)
Troponin I: <0.03 ng/mL (ref <0.031)
Troponin I: <0.03 ng/mL (ref <0.031)

## 2015-11-20 LAB — SEDIMENTATION RATE: Sed Rate: 81 mm/hr — ABNORMAL HIGH (ref 0–22)

## 2015-11-20 LAB — GRAM STAIN: Gram Stain: NONE SEEN

## 2015-11-20 LAB — URINE MICROSCOPIC-ADD ON

## 2015-11-20 LAB — PROTIME-INR
INR: 2.62 — ABNORMAL HIGH (ref 0.00–1.49)
Prothrombin Time: 27.6 seconds — ABNORMAL HIGH (ref 11.6–15.2)

## 2015-11-20 LAB — PROTEIN, TOTAL: Total Protein: 5.8 g/dL — ABNORMAL LOW (ref 6.5–8.1)

## 2015-11-20 LAB — LACTATE DEHYDROGENASE, PLEURAL OR PERITONEAL FLUID: LD, Fluid: 103 U/L — ABNORMAL HIGH (ref 3–23)

## 2015-11-20 LAB — GLUCOSE, SEROUS FLUID: Glucose, Fluid: 100 mg/dL

## 2015-11-20 LAB — D-DIMER, QUANTITATIVE (NOT AT ARMC): D-Dimer, Quant: 3.25 ug/mL-FEU — ABNORMAL HIGH (ref 0.00–0.50)

## 2015-11-20 LAB — PROTEIN, BODY FLUID: Total protein, fluid: 3.1 g/dL

## 2015-11-20 LAB — PROCALCITONIN: Procalcitonin: 0.27 ng/mL

## 2015-11-20 LAB — LACTATE DEHYDROGENASE: LDH: 245 U/L — ABNORMAL HIGH (ref 98–192)

## 2015-11-20 LAB — I-STAT TROPONIN, ED: Troponin i, poc: 0 ng/mL (ref 0.00–0.08)

## 2015-11-20 LAB — CHOLESTEROL, TOTAL: Cholesterol: 146 mg/dL (ref 0–200)

## 2015-11-20 MED ORDER — SODIUM CHLORIDE 0.9% FLUSH
3.0000 mL | Freq: Two times a day (BID) | INTRAVENOUS | Status: DC
  Administered 2015-11-20 – 2015-11-22 (×5): 3 mL via INTRAVENOUS

## 2015-11-20 MED ORDER — SACCHAROMYCES BOULARDII 250 MG PO CAPS
250.0000 mg | ORAL_CAPSULE | Freq: Two times a day (BID) | ORAL | Status: DC
  Administered 2015-11-20 – 2015-11-23 (×7): 250 mg via ORAL
  Filled 2015-11-20 (×7): qty 1

## 2015-11-20 MED ORDER — ONDANSETRON HCL 4 MG PO TABS: 4.0000 mg | ORAL_TABLET | Freq: Four times a day (QID) | ORAL | Status: DC | PRN

## 2015-11-20 MED ORDER — TIMOLOL HEMIHYDRATE 0.5 % OP SOLN
1.0000 [drp] | Freq: Every morning | OPHTHALMIC | Status: DC
Start: 2015-11-20 — End: 2015-11-20

## 2015-11-20 MED ORDER — VANCOMYCIN HCL 10 G IV SOLR
1500.0000 mg | Freq: Once | INTRAVENOUS | Status: AC
  Administered 2015-11-20: 1500 mg via INTRAVENOUS
  Filled 2015-11-20: qty 1500

## 2015-11-20 MED ORDER — AMIODARONE HCL 200 MG PO TABS
400.0000 mg | ORAL_TABLET | Freq: Two times a day (BID) | ORAL | Status: DC
  Administered 2015-11-20 – 2015-11-23 (×7): 400 mg via ORAL
  Filled 2015-11-20 (×7): qty 2

## 2015-11-20 MED ORDER — ONDANSETRON HCL 4 MG/2ML IJ SOLN: 4.0000 mg | Freq: Four times a day (QID) | INTRAMUSCULAR | Status: DC | PRN

## 2015-11-20 MED ORDER — ACETAMINOPHEN 650 MG RE SUPP: 650.0000 mg | Freq: Four times a day (QID) | RECTAL | Status: DC | PRN

## 2015-11-20 MED ORDER — KETOROLAC TROMETHAMINE 15 MG/ML IJ SOLN
15.0000 mg | INTRAMUSCULAR | Status: DC
  Filled 2015-11-20: qty 1

## 2015-11-20 MED ORDER — VANCOMYCIN HCL IN DEXTROSE 750-5 MG/150ML-% IV SOLN
750.0000 mg | INTRAVENOUS | Status: DC
  Administered 2015-11-21: 750 mg via INTRAVENOUS
  Filled 2015-11-20: qty 150

## 2015-11-20 MED ORDER — IBUPROFEN 800 MG PO TABS: 800.0000 mg | ORAL_TABLET | Freq: Four times a day (QID) | ORAL | Status: DC | PRN

## 2015-11-20 MED ORDER — TIMOLOL MALEATE 0.5 % OP SOLN
1.0000 [drp] | Freq: Every day | OPHTHALMIC | Status: DC
  Administered 2015-11-20 – 2015-11-23 (×4): 1 [drp] via OPHTHALMIC
  Filled 2015-11-20: qty 5

## 2015-11-20 MED ORDER — ACETAMINOPHEN 325 MG PO TABS
650.0000 mg | ORAL_TABLET | Freq: Four times a day (QID) | ORAL | Status: DC | PRN
  Administered 2015-11-20 – 2015-11-23 (×4): 650 mg via ORAL
  Filled 2015-11-20 (×4): qty 2

## 2015-11-20 MED ORDER — PIPERACILLIN-TAZOBACTAM IN DEX 2-0.25 GM/50ML IV SOLN
2.2500 g | Freq: Three times a day (TID) | INTRAVENOUS | Status: DC
  Administered 2015-11-20 – 2015-11-22 (×8): 2.25 g via INTRAVENOUS
  Filled 2015-11-20 (×12): qty 50

## 2015-11-20 MED ORDER — METOPROLOL TARTRATE 25 MG PO TABS
25.0000 mg | ORAL_TABLET | Freq: Two times a day (BID) | ORAL | Status: DC
  Administered 2015-11-20 – 2015-11-23 (×6): 25 mg via ORAL
  Filled 2015-11-20 (×7): qty 1

## 2015-11-20 MED ORDER — FAMOTIDINE 20 MG PO TABS
20.0000 mg | ORAL_TABLET | Freq: Two times a day (BID) | ORAL | Status: DC
  Administered 2015-11-20 – 2015-11-21 (×3): 20 mg via ORAL
  Filled 2015-11-20 (×3): qty 1

## 2015-11-20 MED ORDER — LATANOPROST 0.005 % OP SOLN
1.0000 [drp] | Freq: Every day | OPHTHALMIC | Status: DC
  Administered 2015-11-20 – 2015-11-22 (×3): 1 [drp] via OPHTHALMIC
  Filled 2015-11-20: qty 2.5

## 2015-11-20 MED ORDER — NEPRO/CARBSTEADY PO LIQD
237.0000 mL | Freq: Every day | ORAL | Status: DC | PRN
  Filled 2015-11-20: qty 237

## 2015-11-20 MED ORDER — GUAIFENESIN-DM 100-10 MG/5ML PO SYRP
5.0000 mL | ORAL_SOLUTION | Freq: Four times a day (QID) | ORAL | Status: DC | PRN
Start: 2015-11-20 — End: 2015-11-23

## 2015-11-20 MED ORDER — AMIODARONE HCL 200 MG PO TABS: 400.0000 mg | ORAL_TABLET | Freq: Every day | ORAL | Status: DC

## 2015-11-20 MED ORDER — DEXTROSE 5 % IV SOLN: 2.0000 g | INTRAVENOUS | Status: DC

## 2015-11-20 MED ORDER — WARFARIN - PHARMACIST DOSING INPATIENT: Freq: Every day | Status: DC

## 2015-11-20 NOTE — Progress Notes (Signed)
PROGRESS NOTE  [name]  [id number] DOB: 1946-01-05  DOA: 11/20/2015 PCP: [name], MD   Brief Narrative:  70 year old female with a PMH of new ESRD on TTS HD (new HD started during recent hospitalization), nephrolithiasis, HTN, CAD, anemia of chronic disease, s/p chronic right percutaneous nephrostomy tube that was changed during recent admission, hospitalized 10/30/15-11/11/15 for sepsis from right PCN complicated pseudomonas UTI, new HD & PAF with RVR newly started on Coumadin, presented to [facility] ED on 11/19/15 with complaints of left-sided chest pressure and difficulty breathing which started after HD. Chest x-ray showed moderate left pleural effusion. Admitted for further evaluation. Cardiology and nephrology consulted.   Assessment & Plan:   Principal Problem:   Chest pain Active Problems:   Essential hypertension, benign   End stage renal disease (HCC)   Anemia of renal disease   PAF (paroxysmal atrial fibrillation) (HCC)   Pleural effusion, left   Chest pain - Atypical per patient's report.? Related to pleural effusion. Troponin 1 negative. - Cardiology input appreciated. History of normal coronaries 2014. ACS felt less likely. Continue to cycle troponins. Small pericardial effusion on CT chest. Clinically not uremic and is undergoing regular hemodialysis (last HD on 6/1). - D-dimer 3.25 probably nonspecific and of unclear significance. Patient has been consistently anticoagulated since 5/24.  - CT chest without contrast showed left lung base opacification due to combination of consolidation and small-to-moderate layering left pleural effusion, favoring pneumonia. However patient is afebrile, no leukocytosis and is already on Fortaz across dialysis for Pseudomonas UTI (may have just completed a couple days back). Will consult pulmonology to consider diagnostic and therapeutic thoracentesis.  Right pleural effusion - DD: Volume overload from ESRD and HD,  parapneumonic versus other etiology. - Ceftaz per pharmacy. - Pulmonology consultation for diagnostic and therapeutic right thoracentesis. Not sure about pneumonia. Trend pro-calcitonin.   ESRD on HD TTS - Nephrology consulted. Last HD on 6/1. Not clinically overtly volume overloaded or uremic.  Recent sepsis secondary to Pseudomonas UTI complicating chronic right PCN - Patient probably completed IV ceftazidime 2 week course (start date was 10/30/15). No urinary symptoms reported. Discuss with urology regarding need for suppressive antibiotics.  Chronic obstructive uropathy with chronic right PCN - Right PCN was changed by IR on 11/04/15. Outpatient follow-up with urology/Dr. Era Bumpers  Essential hypertension - Controlled  GERD/large hiatal hernia by CT chest - H2 blockers  Anemia of chronic kidney disease - No bleeding reported. Follow CBCs closely.  Paroxysmal A. fib with RVR - Continue metoprolol. Therapeutically anticoagulated on Coumadin-Coumadin held and switched to IV heparin for possible procedures. Cardiology follow-up appreciated.   DVT prophylaxis: INR therapeutic today. Coumadin held and starting IV heparin when INR <2 in case procedures needed. Code Status:  Full Family Communication:  Discussed with patient. No family at bedside. Disposition Plan: DC home when medically stable.   Consultants:   Nephrology  cardiology  Procedures:   Has chronic right PCA and which was changed by IR on 11/04/15.  Antimicrobials:   [name] across HD    Subjective: Denies chest pressure or dyspnea this morning. Denied palpitations, fever. Has minimal chronic dry cough. No pain at PCN site or dysuria or urinary urgency.  Objective:  Filed Vitals:   11/20/15 0230 11/20/15 0300 11/20/15 0426 11/20/15 0947  BP: 108/54 114/57 120/57 106/60  Pulse: 84 82 83 96  Temp:   97.8 F (36.6 C)   TempSrc:   Oral   Resp: 17 19 18    Height:  5\' 3"  (1.6 m)   Weight:   80.3 kg (177 lb  0.5 oz)   SpO2: 94% 96% 93%     Intake/Output Summary (Last 24 hours) at 11/20/15 1037 Last data filed at 11/20/15 0800  Gross per 24 hour  Intake  24050 ml  Output      0 ml  Net  24050 ml   Filed Weights   11/19/15 2201 11/20/15 0426  Weight: 79.379 kg (175 lb) 80.3 kg (177 lb 0.5 oz)    Examination:  General exam: Pleasant elderly female sitting up comfortably in chair this morning. Respiratory system: : Reduced breath sounds in the left base with bronchial breath sounds on top. Rest of lung fields clear to auscultation. Respiratory effort normal. Cardiovascular system: S1 & S2 heard,  irregularly irregular. No JVD, murmurs, rubs, gallops or clicks. No pedal edema.. Telemetry: PAF with ventricular rate in the 90s-110s. Gastrointestinal system: Abdomen is nondistended, soft and nontender. No organomegaly or masses felt. Normal bowel sounds heard. Central nervous system: Alert and oriented. No focal neurological deficits. Extremities: Symmetric 5 x 5 power. Skin: No rashes, lesions or ulcers Psychiatry: Judgement and insight appear normal. Mood & affect appropriate.     Data Reviewed: I have personally reviewed following labs and imaging studies  CBC:  Recent Labs Lab 11/19/15 2214 11/20/15 0538  WBC 7.5 5.3  NEUTROABS  --  3.5  HGB 8.8* 8.6*  HCT 28.9* 28.9*  MCV 87.6 89.5  PLT 274 [id number]   Basic Metabolic Panel:  Recent Labs Lab 11/19/15 2214 11/20/15 0538  NA 132* 134*  K 3.6 3.6  CL 99* 98*  CO2 25 26  GLUCOSE 123* 94  BUN 19 22*  CREATININE 4.20* 5.02*  CALCIUM 7.9* 8.5*   GFR: Estimated Creatinine Clearance: 10.3 mL/min (by C-G formula based on Cr of 5.02). Liver Function Tests:  Recent Labs Lab 11/20/15 0538  AST 11*  ALT 14  ALKPHOS 63  BILITOT 0.4  PROT 5.7*  ALBUMIN 2.3*   No results for input(s): LIPASE, AMYLASE in the last 168 hours. No results for input(s): AMMONIA in the last 168 hours. Coagulation Profile:  Recent Labs Lab  11/20/15 0538  INR 2.62*   Cardiac Enzymes:  Recent Labs Lab 11/20/15 0538  TROPONINI <0.03   BNP (last 3 results) No results for input(s): PROBNP in the last 8760 hours. HbA1C: No results for input(s): HGBA1C in the last 72 hours. CBG: No results for input(s): GLUCAP in the last 168 hours. Lipid Profile: No results for input(s): CHOL, HDL, LDLCALC, TRIG, CHOLHDL, LDLDIRECT in the last 72 hours. Thyroid Function Tests: No results for input(s): TSH, T4TOTAL, FREET4, T3FREE, THYROIDAB in the last 72 hours. Anemia Panel: No results for input(s): VITAMINB12, FOLATE, FERRITIN, TIBC, IRON, RETICCTPCT in the last 72 hours.  Sepsis Labs: No results for input(s): PROCALCITON, LATICACIDVEN in the last 168 hours.  No results found for this or any previous visit (from the past 240 hour(s)).       Radiology Studies: Dg Chest 2 View  11/19/2015  CLINICAL DATA:  Left-sided chest pain. EXAM: CHEST  2 VIEW COMPARISON:  10/31/2015 FINDINGS: Right-sided dialysis catheter tip in the SVC. Development of moderate size left pleural effusion from prior. There is a retrocardiac hiatal hernia. Small right pleural effusion. Cardiomediastinal contours are unchanged, partially obscured by pleural fluid. Mild chronic bronchitic changes without pulmonary edema. No pneumothorax. IMPRESSION: 1. Moderate left and trace right pleural effusions, new from prior exam. 2. Hiatal hernia.  Electronically Signed   By: Jeb Levering M.D.   On: 11/19/2015 23:23   Ct Chest Wo Contrast  11/20/2015  CLINICAL DATA:  70 year old female with left side chest pain. Pleural effusions. End-stage renal disease. Initial encounter. EXAM: CT CHEST WITHOUT CONTRAST TECHNIQUE: Multidetector CT imaging of the chest was performed following the standard protocol without IV contrast. COMPARISON:  Chest radiographs 11/19/2015 and earlier. CT Abdomen and Pelvis 05/02/2015. FINDINGS: Chronic moderate to large gastric hiatal hernia. The left  lung base is completely opacified and this is due to a combination of small to moderate left pleural effusion and left lower lobe consolidation with air bronchograms (series 7, image 79). Aside from mild atelectasis the left lung base was clear in November. There is a small layering pleural effusion on the right. Both lungs otherwise are clear aside from mild biapical scarring. Major airways are patent. There is a new pericardial effusion which is small, but somewhat intermediate in density raising the possibility of complex fluid. Stable cardiac size. Calcified coronary artery atherosclerosis, with no significant calcified aortic atherosclerosis in the chest. No thoracic lymphadenopathy. Right IJ approach dual lumen dialysis or free cyst type catheter. Sub cm left thyroid nodule, does not meet consensus criteria for ultrasound follow-up. Negative visualized noncontrast liver and spleen in the upper abdomen. No osseous abnormality identified in the chest. IMPRESSION: 1. Left lung base opacification due to a combination of left lower lobe consolidation and small to moderate layering left pleural effusion. Favor pneumonia. 2. Small but intermediate density pericardial effusion which is new since November. Consider pericarditis or other exudative pericardial process. 3. Trace right pleural effusion. Chronic large gastric hiatal hernia. Calcified coronary artery atherosclerosis. Electronically Signed   By: Genevie Ann M.D.   On: 11/20/2015 09:23        Scheduled Meds: . [START ON 11/21/2015] ceftAZIDime (FORTAZ) 2 g IVPB  2 g Intravenous Q T,Th,Sa-HD  . famotidine  20 mg Oral BID  . latanoprost  1 drop Both Eyes QHS  . metoprolol tartrate  25 mg Oral BID  . saccharomyces boulardii  250 mg Oral BID  . sodium chloride flush  3 mL Intravenous Q12H  . timolol  1 drop Both Eyes Daily   Continuous Infusions:       Time spent: 45 minutes.    [facility], MD [facility] Pager [phone]  If  7PM-7AM, please contact night-coverage [email or web address] Password [facility] 11/20/2015, 10:37 AM

## 2015-11-20 NOTE — Consult Note (Signed)
PULMONARY / CRITICAL CARE MEDICINE   Name: [name] MRN: [id number] DOB: [birth year]    ADMISSION DATE:  11/20/2015 CONSULTATION DATE:  11/20/15  REFERRING MD:  Tera Partridge MD  CHIEF COMPLAINT:  Chest pain, pleural effusion with consolidation.    HISTORY OF PRESENT ILLNESS:   70 year old with past medical history of diastolic CHF, HTN, A. fib, end-stage renal disease on HD, status post nephrostomy placement for obstructive uropathy , recent admission from 5/12 - 11/11/15 for  for pseudomonas urosepsis. The hospital course was complicated by Afib with RVR.   Now readmitted with lt chest pain, pressure after HD associated with dyspnea.. CT scan shows Lt LL consolidation with associated effusion. PCCM called for evaluation.  PAST MEDICAL HISTORY :  She  has a past medical history of GERD (gastroesophageal reflux disease); Glaucoma; H/O renal calculi (2002 & 2006); H/O hiatal hernia; Headache(784.0); Pneumonia; Hyperlipidemia; PAF (paroxysmal atrial fibrillation) ([place]); Chronic diastolic CHF (congestive heart failure) ([place]); Sigmoid diverticulosis; Nephrolithiasis; Right ureteral stone; Hypertension; S/P hemodialysis catheter insertion ([place]) (04/11/2015 ); History of nephrostomy (04/11/2015 ); History of blood transfusion (04/13/2015 ); Low iron; Glaucoma; History of blood product transfusion; Complication of anesthesia; ESRD (end stage renal disease) ([place]); LV dysfunction; Normal coronary arteries (2014); and Hiatal hernia.  PAST SURGICAL HISTORY: She  has past surgical history that includes Cystoscopy w/ ureteral stent placement (04/04/2012); Total abdominal hysterectomy w/ bilateral salpingoophorectomy (1993); Breast biopsy (Left, 08/23/07); Cardiac catheterization (07-11-2012  dr Irish Lack); Cardiovascular stress test (06-26-2012  dr Irish Lack); transthoracic echocardiogram (04-09-2012); Cataract extraction w/ intraocular lens  implant, bilateral; Knee arthroscopy (Left, 02-14-2003); Laparoscopic  cholecystectomy (03-23-2005); Extracorporeal shock wave lithotripsy (05-28-2012  &  10-08-2012); Cystoscopy with stent placement (Right, 10/28/2014); Cystoscopy/retrograde/ureteroscopy/stone extraction with basket (Right, 11/21/2014); Holmium laser application (Right, [id number]); Cystoscopy with stent placement (Right, 02/26/2015); Cystoscopy w/ ureteral stent placement (Bilateral, 05/04/2015); AV fistula placement (Left, 06/02/2015); Abdominal hysterectomy; Bascilic vein transposition (Left, 07/27/2015); Bascilic vein transposition (Left, [id number]); and Bascilic vein transposition (Left, 10/12/2015).  Allergies  Allergen Reactions  . Vicodin [Hydrocodone-Acetaminophen] Nausea And Vomiting  . Chlorhexidine Rash    Sunburn    rash  . Percocet [Oxycodone-Acetaminophen] Nausea And Vomiting    No current facility-administered medications on file prior to encounter.   Current Outpatient Prescriptions on File Prior to Encounter  Medication Sig  . acetaminophen (TYLENOL) 500 MG tablet Take 1,000 mg by mouth every 6 (six) hours as needed for moderate pain or headache.   . bimatoprost (LUMIGAN) 0.01 % SOLN Place 1 drop into both eyes at bedtime.  . cefTAZidime 2 g in dextrose 5 % 50 mL Inject 2 g into the vein Every Tuesday,Thursday,and Saturday with dialysis. 2 gm IV via HD in Tu-Thurs-Sat. Start date was May 18th, 2017 and continue until June 17th, 2017. This will complete 14 days therapy. After that continuation of possible suppressive Antibiotic therapy to be discuss with primary care provider.  Marland Kitchen guaiFENesin-dextromethorphan (ROBITUSSIN DM) 100-10 MG/5ML syrup Take 5 mLs by mouth every 6 (six) hours as needed for cough.  . metoprolol tartrate (LOPRESSOR) 25 MG tablet Take 1 tablet (25 mg total) by mouth 2 (two) times daily.  . Nutritional Supplements (FEEDING SUPPLEMENT, NEPRO CARB STEADY,) LIQD Take 237 mLs by mouth daily. (Patient taking differently: Take 237 mLs by mouth daily as needed (nutritional  supplement). )  . ranitidine (ZANTAC) 150 MG tablet Take 150 mg by mouth 2 (two) times daily as needed for heartburn.  . saccharomyces boulardii (FLORASTOR) 250 MG capsule Take 1 capsule (  250 mg total) by mouth 2 (two) times daily.  . sodium chloride (OCEAN) 0.65 % SOLN nasal spray Place 1 spray into both nostrils 3 (three) times daily as needed for congestion.  . timolol (BETIMOL) 0.5 % ophthalmic solution Place 1 drop into both eyes every morning.   . warfarin (COUMADIN) 5 MG tablet Take 1 tablet (5 mg total) by mouth one time only at 6 PM.    FAMILY HISTORY:  Her indicated that her mother is deceased. She indicated that her father is deceased. She indicated that her brother is deceased.   SOCIAL HISTORY: She  reports that she has never smoked. She has never used smokeless tobacco. She reports that she does not drink alcohol or use illicit drugs.  REVIEW OF SYSTEMS:   Left chest pain, dyspnea, chest tightness. Denies any cough, sputum production, fevers, chills. Denies any nausea, vomiting, diarrhea, consultation. Denies any palpitation. All other review of systems are negative  SUBJECTIVE:   VITAL SIGNS: BP 106/60 mmHg  Pulse 96  Temp(Src) 97.8 F (36.6 C) (Oral)  Resp 18  Ht 5\' 3"  (1.6 m)  Wt 177 lb 0.5 oz (80.3 kg)  BMI 31.37 kg/m2  SpO2 93%  LMP 01/19/1992 (Approximate)  HEMODYNAMICS:    VENTILATOR SETTINGS:    INTAKE / OUTPUT:    PHYSICAL EXAMINATION: General:  Awake, alert, No apparent distress Neuro: Oriented. No focal deficits HEENT:  No thyromegaly, JVD Cardiovascular:  Irregular No MRG Lungs:  Reduced BS at lt base with crackles  Abdomen:  Soft, Distended, + BS Musculoskeletal:  Normal tone and bulk Skin:  Intact  LABS:  BMET  Recent Labs Lab 11/19/15 2214 11/20/15 0538  NA 132* 134*  K 3.6 3.6  CL 99* 98*  CO2 25 26  BUN 19 22*  CREATININE 4.20* 5.02*  GLUCOSE 123* 94    Electrolytes  Recent Labs Lab 11/19/15 2214 11/20/15 0538   CALCIUM 7.9* 8.5*    CBC  Recent Labs Lab 11/19/15 2214 11/20/15 0538  WBC 7.5 5.3  HGB 8.8* 8.6*  HCT 28.9* 28.9*  PLT 274 242    Coag's  Recent Labs Lab 11/20/15 0538  INR 2.62*    Sepsis Markers No results for input(s): LATICACIDVEN, PROCALCITON, O2SATVEN in the last 168 hours.  ABG No results for input(s): PHART, PCO2ART, PO2ART in the last 168 hours.  Liver Enzymes  Recent Labs Lab 11/20/15 0538  AST 11*  ALT 14  ALKPHOS 63  BILITOT 0.4  ALBUMIN 2.3*    Cardiac Enzymes  Recent Labs Lab 11/20/15 0538  TROPONINI <0.03    Glucose No results for input(s): GLUCAP in the last 168 hours.  Imaging Dg Chest 2 View  11/19/2015  CLINICAL DATA:  Left-sided chest pain. EXAM: CHEST  2 VIEW COMPARISON:  10/31/2015 FINDINGS: Right-sided dialysis catheter tip in the SVC. Development of moderate size left pleural effusion from prior. There is a retrocardiac hiatal hernia. Small right pleural effusion. Cardiomediastinal contours are unchanged, partially obscured by pleural fluid. Mild chronic bronchitic changes without pulmonary edema. No pneumothorax. IMPRESSION: 1. Moderate left and trace right pleural effusions, new from prior exam. 2. Hiatal hernia. Electronically Signed   By: Jeb Levering M.D.   On: 11/19/2015 23:23   Ct Chest Wo Contrast  11/20/2015  CLINICAL DATA:  70 year old female with left side chest pain. Pleural effusions. End-stage renal disease. Initial encounter. EXAM: CT CHEST WITHOUT CONTRAST TECHNIQUE: Multidetector CT imaging of the chest was performed following the standard protocol without IV  contrast. COMPARISON:  Chest radiographs 11/19/2015 and earlier. CT Abdomen and Pelvis 05/02/2015. FINDINGS: Chronic moderate to large gastric hiatal hernia. The left lung base is completely opacified and this is due to a combination of small to moderate left pleural effusion and left lower lobe consolidation with air bronchograms (series 7, image 79). Aside  from mild atelectasis the left lung base was clear in November. There is a small layering pleural effusion on the right. Both lungs otherwise are clear aside from mild biapical scarring. Major airways are patent. There is a new pericardial effusion which is small, but somewhat intermediate in density raising the possibility of complex fluid. Stable cardiac size. Calcified coronary artery atherosclerosis, with no significant calcified aortic atherosclerosis in the chest. No thoracic lymphadenopathy. Right IJ approach dual lumen dialysis or free cyst type catheter. Sub cm left thyroid nodule, does not meet consensus criteria for ultrasound follow-up. Negative visualized noncontrast liver and spleen in the upper abdomen. No osseous abnormality identified in the chest. IMPRESSION: 1. Left lung base opacification due to a combination of left lower lobe consolidation and small to moderate layering left pleural effusion. Favor pneumonia. 2. Small but intermediate density pericardial effusion which is new since November. Consider pericarditis or other exudative pericardial process. 3. Trace right pleural effusion. Chronic large gastric hiatal hernia. Calcified coronary artery atherosclerosis. Electronically Signed   By: Genevie Ann M.D.   On: 11/20/2015 09:23     STUDIES:  CT chest 11/20/15 >> left lower lobe consolidative changes with associated effusion. Images reviewed.  CULTURES: Ucx 6/2 >  ANTIBIOTICS: Vanco 6/2 > Zosyn 6/2 >  SIGNIFICANT EVENTS:  LINES/TUBES:  DISCUSSION: 69 year old with recent admission for urosepsis. Now readmitted with left lower lobe chest pain, dyspnea. CT scan findings concerning for left lower lobe HCAP with associated effusion.  Reccs: - Recommend treating with broad spectrum antibiotics for HCAP. - Check cultures, Procalcitonin - Thoracentesis today.  Marshell Garfinkel MD  Pulmonary and Critical Care Pager [phone] If no answer or after 3pm call:  [phone] 11/20/2015, 2:03 PM

## 2015-11-20 NOTE — ED Provider Notes (Signed)
CSN: [id number]     Arrival date & time 11/19/15  2153 History  By signing my name below, I, [name], attest that this documentation has been prepared under the direction and in the presence of Amy Balls, MD. Electronically signed, [name], ED Scribe. 11/20/2015. 2:23 AM.   Chief Complaint  Patient presents with  . Chest Pain   The history is provided by the patient and a relative. No language interpreter was used.   HPI Comments: [name] is a 70 y.o. female with a PMHx of Nephrolithiasis, CKD, CHF, ESRD, sepsis, UTI, MI(2013), A-fib, who presents to the Emergency Department complaining of constant, gradually worsening chest pain onset yesterday.. Pt describes the discomfort as pressure on her chest and states pain is "at the top of her heart". Pt reports associated symptoms of SOB and dry cough.  Pt takes Coumadin daily. Pt denies any fever. Pt underwent last dialysis treatment yesterday. Pt's daughter reports she has gone into A-fib 4 or 5 times since starting dialysis. Pt currently has a nephrostomy tube in place to the right kidney and passes minimal urine through her bladder. Pt was recently discharged from the hospital on 5/24.   Past Medical History  Diagnosis Date  . GERD (gastroesophageal reflux disease)   . Glaucoma   . H/O renal calculi 2002 & 2006  . H/O hiatal hernia   . Headache(784.0)     migraine  . Pneumonia     dx 10-06-2014 per CXR--  on 10-27-2014 pt states finished antibiotic and denies cough or fever  . Hyperlipidemia   . History of atrial fibrillation     10/ 2013  in setting of Septic Shock  . History of CHF (congestive heart failure)     10/ 2013 in setting of septic shock  . Sigmoid diverticulosis   . Nephrolithiasis     bilateral  . CKD (chronic kidney disease), stage III     nephrologist-  dr Florene Glen  . Right ureteral stone   . Hypertension     medication removed from regimen due to low blood pressure   . Peripheral vascular disease ([place])    . S/P hemodialysis catheter insertion ([place]) 04/11/2015     right anterior chest , only used once   . History of nephrostomy 04/11/2015     currently inplace 04/28/2015   . History of blood transfusion 04/13/2015   . Low iron     hx  . Glaucoma   . History of blood product transfusion   . Complication of anesthesia     use a little anesthesia , per patient MD states she quit breathing (2016); hard to wake up  . ESRD (end stage renal disease) [facility])    Past Surgical History  Procedure Laterality Date  . Cystoscopy w/ ureteral stent placement  04/04/2012    Procedure: CYSTOSCOPY WITH RETROGRADE PYELOGRAM/URETERAL STENT PLACEMENT;  Surgeon: Ailene Rud, MD;  Location: [place];  Service: Urology;  Laterality: Left;  . Total abdominal hysterectomy w/ bilateral salpingoophorectomy  1993    secondary to fibroids  . Breast biopsy Left 08/23/07    benign fibrocystic with duct ectasia  . Cardiac catheterization  07-11-2012  dr Irish Lack    Abnormal stress test/   normal coronary arteries/  LVEDP  27mmHg  . Cardiovascular stress test  06-26-2012  dr Irish Lack    marked ischemia in the basal anterior, mid anterior, apical inferior regions/  normal LVF, ef 63%  . Transthoracic echocardiogram  04-09-2012  normal LVF,  ef 60-65%,  mild LAE,  mild TR, trivial MR and PR  . Cataract extraction w/ intraocular lens  implant, bilateral    . Knee arthroscopy Left 02-14-2003  . Laparoscopic cholecystectomy  03-23-2005  . Extracorporeal shock wave lithotripsy  05-28-2012  &  10-08-2012  . Cystoscopy with stent placement Right 10/28/2014    Procedure: RIGHT URETERAL STENT PLACEMENT;  Surgeon: Irine Seal, MD;  Location: [facility];  Service: Urology;  Laterality: Right;  . Cystoscopy/retrograde/ureteroscopy/stone extraction with basket Right 11/21/2014    Procedure: CYSTOSCOPY/RIGHT RETROGRADE PYELOGRAM/RIGHT URETEROSCOPY/BASKET EXTRACTION/RIGHT PYELOSCOPY/LASER OF STONE/RIGHT DOUBLE J  STENT;  Surgeon: Carolan Clines, MD;  Location: [place];  Service: Urology;  Laterality: Right;  . Holmium laser application Right [id number]    Procedure: HOLMIUM LASER APPLICATION;  Surgeon: Carolan Clines, MD;  Location: [facility];  Service: Urology;  Laterality: Right;  . Cystoscopy with stent placement Right 02/26/2015    Procedure: CYSTOSCOPY RETROGRADE PYELOGRAM WITH STENT PLACEMENT;  Surgeon: Cleon Gustin, MD;  Location: [facility];  Service: Urology;  Laterality: Right;  . Cystoscopy w/ ureteral stent placement Bilateral 05/04/2015    Procedure: CYSTOSCOPY WITH BILATERAL RETROGRADE PYELOGRAM/ WITH INTERPRETATION, EXCHANGE OF RIGHT URETERAL STENT REPLACEMENT AND PLACEMENT LEFT URETERAL STENT PLACEMENT EXAMINATION OF VAGINA;  Surgeon: Carolan Clines, MD;  Location: [facility];  Service: Urology;  Laterality: Bilateral;  . Av fistula placement Left 06/02/2015    Procedure: BRACHIOCEPHALIC ARTERIOVENOUS (AV) FISTULA CREATION ;  Surgeon: Conrad [place], MD;  Location: [place];  Service: Vascular;  Laterality: Left;  . Abdominal hysterectomy    . Bascilic vein transposition Left 07/27/2015    Procedure: FIRST STAGE BASILIC VEIN TRANSPOSITION LEFT UPPER ARM;  Surgeon: Conrad [place], MD;  Location: [place];  Service: Vascular;  Laterality: Left;  . Bascilic vein transposition Left 09/2015    second phase  . Bascilic vein transposition Left 10/12/2015    Procedure: SECOND STAGE BASILIC VEIN TRANSPOSITION LEFT ARM;  Surgeon: Conrad [place], MD;  Location: [facility];  Service: Vascular;  Laterality: Left;   Family History  Problem Relation Age of Onset  . Hypertension Mother   . Cancer Mother 32    breast  . Dementia Mother   . Hypertension Brother   . Diabetes Brother   . Heart disease Brother     before age 56  . Cancer Father 39    pancreatic  . Heart failure Paternal Grandmother   . Bladder Cancer Maternal Grandfather    Social History  Substance Use Topics   . Smoking status: Never Smoker   . Smokeless tobacco: Never Used  . Alcohol Use: No   OB History    Gravida Para Term Preterm AB TAB SAB Ectopic Multiple Living   1 1 1  0 0 0 0 0 0 1     Review of Systems  Constitutional: Negative for fever.  Respiratory: Positive for cough and shortness of breath.   Cardiovascular: Positive for chest pain.  All other systems reviewed and are negative.     Allergies  Vicodin; Chlorhexidine; and Percocet  Home Medications   Prior to Admission medications   Medication Sig Start Date End Date Taking? Authorizing Provider  acetaminophen (TYLENOL) 500 MG tablet Take 1,000 mg by mouth every 6 (six) hours as needed for moderate pain or headache.     Historical Provider, MD  bimatoprost (LUMIGAN) 0.01 % SOLN Place 1 drop into both eyes at bedtime.  Historical Provider, MD  cefTAZidime 2 g in dextrose 5 % 50 mL Inject 2 g into the vein Every Tuesday,Thursday,and Saturday with dialysis. 2 gm IV via HD in Tu-Thurs-Sat. Start date was May 18th, 2017 and continue until June 17th, 2017. This will complete 14 days therapy. After that continuation of possible suppressive Antibiotic therapy to be discuss with primary care provider. 11/11/15   Theodis Blaze, MD  guaiFENesin-dextromethorphan (ROBITUSSIN DM) 100-10 MG/5ML syrup Take 5 mLs by mouth every 6 (six) hours as needed for cough. 11/11/15   Theodis Blaze, MD  metoprolol tartrate (LOPRESSOR) 25 MG tablet Take 1 tablet (25 mg total) by mouth 2 (two) times daily. 11/11/15   Theodis Blaze, MD  Nutritional Supplements (FEEDING SUPPLEMENT, NEPRO CARB STEADY,) LIQD Take 237 mLs by mouth daily. Patient taking differently: Take 237 mLs by mouth daily as needed (nutritional supplement).  05/07/15   Maryann Mikhail, DO  nystatin cream (MYCOSTATIN) Apply 1 application topically 2 (two) times daily. Apply to affected area BID for up to 7 days. 10/28/15   Kem Boroughs, FNP  ranitidine (ZANTAC) 150 MG tablet Take 150 mg by  mouth 2 (two) times daily as needed for heartburn.    Historical Provider, MD  saccharomyces boulardii (FLORASTOR) 250 MG capsule Take 1 capsule (250 mg total) by mouth 2 (two) times daily. 07/08/15   Donne Hazel, MD  sodium chloride (OCEAN) 0.65 % SOLN nasal spray Place 1 spray into both nostrils 3 (three) times daily as needed for congestion.    Historical Provider, MD  timolol (BETIMOL) 0.5 % ophthalmic solution Place 1 drop into both eyes every morning.     Historical Provider, MD  traMADol (ULTRAM) 50 MG tablet Take 1 tablet (50 mg total) by mouth every 6 (six) hours as needed. Patient taking differently: Take 50 mg by mouth every 6 (six) hours as needed for moderate pain or severe pain.  10/12/15   Samantha J Rhyne, PA-C  triamcinolone (KENALOG) 0.025 % ointment Apply 1 application topically 2 (two) times daily. 10/28/15   Kem Boroughs, FNP  warfarin (COUMADIN) 5 MG tablet Take 1 tablet (5 mg total) by mouth one time only at 6 PM. 11/11/15   Theodis Blaze, MD   BP 97/55 mmHg  Pulse 94  Temp(Src) 98 F (36.7 C) (Oral)  Resp 20  Ht 5\' 3"  (1.6 m)  Wt 175 lb (79.379 kg)  BMI 31.01 kg/m2  SpO2 96%  LMP 01/19/1992 (Approximate) Physical Exam  Constitutional: She is oriented to person, place, and time. She appears well-developed and well-nourished. No distress.  HENT:  Head: Normocephalic and atraumatic.  Nose: Nose normal.  Mouth/Throat: Oropharynx is clear and moist. No oropharyngeal exudate.  Eyes: Conjunctivae and EOM are normal. Pupils are equal, round, and reactive to light. No scleral icterus.  Neck: Normal range of motion. Neck supple. No JVD present. No tracheal deviation present. No thyromegaly present.  Cardiovascular: Normal rate, regular rhythm and normal heart sounds.  Exam reveals no gallop and no friction rub.   No murmur heard. Pulmonary/Chest: Effort normal and breath sounds normal. No respiratory distress. She has no wheezes. She exhibits no tenderness.  Abdominal:  Soft. Bowel sounds are normal. She exhibits no distension and no mass. There is no tenderness. There is no rebound and no guarding.  Genitourinary:  Right tunnel dialysis catheter, LUE av fistula with palpable thrill, Right sided nephrostomy tube with minimal output  Musculoskeletal: Normal range of motion. She exhibits edema. She  exhibits no tenderness.  Bilateral lower extremity edema.  Lymphadenopathy:    She has no cervical adenopathy.  Neurological: She is alert and oriented to person, place, and time. No cranial nerve deficit. She exhibits normal muscle tone.  Skin: Skin is warm and dry. No rash noted. No erythema. No pallor.  Nursing note and vitals reviewed.   ED Course  Procedures  DIAGNOSTIC STUDIES: Oxygen Saturation is 96% on RA, normal by my interpretation.  COORDINATION OF CARE: 2:23 AM-Will order lab work and CXR. Discussed treatment plan with pt at bedside and pt agreed to plan.   Labs Review Labs Reviewed  BASIC METABOLIC PANEL - Abnormal; Notable for the following:    Sodium 132 (*)    Chloride 99 (*)    Glucose, Bld 123 (*)    Creatinine, Ser 4.20 (*)    Calcium 7.9 (*)    GFR calc non Af Amer 10 (*)    GFR calc Af Amer 11 (*)    All other components within normal limits  CBC - Abnormal; Notable for the following:    RBC 3.30 (*)    Hemoglobin 8.8 (*)    HCT 28.9 (*)    RDW 16.7 (*)    All other components within normal limits  URINE CULTURE  URINALYSIS, ROUTINE W REFLEX MICROSCOPIC (NOT AT [facility])  Randolm Idol, ED    Imaging Review Dg Chest 2 View  11/19/2015  CLINICAL DATA:  Left-sided chest pain. EXAM: CHEST  2 VIEW COMPARISON:  10/31/2015 FINDINGS: Right-sided dialysis catheter tip in the SVC. Development of moderate size left pleural effusion from prior. There is a retrocardiac hiatal hernia. Small right pleural effusion. Cardiomediastinal contours are unchanged, partially obscured by pleural fluid. Mild chronic bronchitic changes without  pulmonary edema. No pneumothorax. IMPRESSION: 1. Moderate left and trace right pleural effusions, new from prior exam. 2. Hiatal hernia. Electronically Signed   By: Jeb Levering M.D.   On: 11/19/2015 23:23   I have personally reviewed and evaluated these images and lab results as part of my medical decision-making.   EKG Interpretation   Date/Time:  Friday November 20 2015 00:14:34 EDT Ventricular Rate:  93 PR Interval:  156 QRS Duration: 90 QT Interval:  370 QTC Calculation: 460 R Axis:   85 Text Interpretation:  Normal sinus rhythm Normal ECG No significant change  since last tracing Confirmed by Glynn Octave [phone]) on 11/20/2015  1:38:12 AM      MDM   Final diagnoses:  None   Patient presents to the ED for chest pressure concerning for ACS.  Daughter states this sometimes reflects UTI as well, bu the patient just emptied her bag prior to ED arrival. Initial troponin an EKG are negative.  Patient is still complaining of chest pressure.  She will require admission for further evaluation.  Dr. Hal Hope accepts patient to telemetry.   I personally performed the services described in this documentation, which was scribed in my presence. The recorded information has been reviewed and is accurate.      Amy Balls, MD 11/20/15 [phone]

## 2015-11-20 NOTE — Progress Notes (Addendum)
ANTICOAGULATION CONSULT NOTE - Initial Consult  Pharmacy Consult for heparin Indication: atrial fibrillation and r/o ACS  Allergies  Allergen Reactions  . Vicodin [Hydrocodone-Acetaminophen] Nausea And Vomiting  . Chlorhexidine Rash    Sunburn    rash  . Percocet [Oxycodone-Acetaminophen] Nausea And Vomiting    Patient Measurements: Height: 5\' 3"  (160 cm) Weight: 177 lb 0.5 oz (80.3 kg) IBW/kg (Calculated) : 52.4  Vital Signs: Temp: 97.8 F (36.6 C) (06/02 0426) Temp Source: Oral (06/02 0426) BP: 120/57 mmHg (06/02 0426) Pulse Rate: 83 (06/02 0426)  Labs:  Recent Labs  11/19/15 2214  HGB 8.8*  HCT 28.9*  PLT 274  CREATININE 4.20*    Estimated Creatinine Clearance: 12.3 mL/min (by C-G formula based on Cr of 4.2).   Medical History: Past Medical History  Diagnosis Date  . GERD (gastroesophageal reflux disease)   . Glaucoma   . H/O renal calculi 2002 & 2006  . H/O hiatal hernia   . Headache(784.0)     migraine  . Pneumonia     dx 10-06-2014 per CXR--  on 10-27-2014 pt states finished antibiotic and denies cough or fever  . Hyperlipidemia   . History of atrial fibrillation     10/ 2013  in setting of Septic Shock  . History of CHF (congestive heart failure)     10/ 2013 in setting of septic shock  . Sigmoid diverticulosis   . Nephrolithiasis     bilateral  . CKD (chronic kidney disease), stage III     nephrologist-  dr Florene Glen  . Right ureteral stone   . Hypertension     medication removed from regimen due to low blood pressure   . Peripheral vascular disease ([place])   . S/P hemodialysis catheter insertion ([place]) 04/11/2015     right anterior chest , only used once   . History of nephrostomy 04/11/2015     currently inplace 04/28/2015   . History of blood transfusion 04/13/2015   . Low iron     hx  . Glaucoma   . History of blood product transfusion   . Complication of anesthesia     use a little anesthesia , per patient MD states she quit breathing  (2016); hard to wake up  . ESRD (end stage renal disease) ([place])     Medications:  Prescriptions prior to admission  Medication Sig Dispense Refill Last Dose  . acetaminophen (TYLENOL) 500 MG tablet Take 1,000 mg by mouth every 6 (six) hours as needed for moderate pain or headache.    Past Week at Unknown time  . bimatoprost (LUMIGAN) 0.01 % SOLN Place 1 drop into both eyes at bedtime.   11/18/2015  . cefTAZidime 2 g in dextrose 5 % 50 mL Inject 2 g into the vein Every Tuesday,Thursday,and Saturday with dialysis. 2 gm IV via HD in Tu-Thurs-Sat. Start date was May 18th, 2017 and continue until June 17th, 2017. This will complete 14 days therapy. After that continuation of possible suppressive Antibiotic therapy to be discuss with primary care provider. 10 Syringe 0 11/19/2015 at Unknown time  . guaiFENesin-dextromethorphan (ROBITUSSIN DM) 100-10 MG/5ML syrup Take 5 mLs by mouth every 6 (six) hours as needed for cough. 118 mL 0 Past Week at Unknown time  . metoprolol tartrate (LOPRESSOR) 25 MG tablet Take 1 tablet (25 mg total) by mouth 2 (two) times daily. 60 tablet 3 11/19/2015 at 1800  . Nutritional Supplements (FEEDING SUPPLEMENT, NEPRO CARB STEADY,) LIQD Take 237 mLs by mouth daily. (  Patient taking differently: Take 237 mLs by mouth daily as needed (nutritional supplement). ) 30 Can 0 Past Week at Unknown time  . ranitidine (ZANTAC) 150 MG tablet Take 150 mg by mouth 2 (two) times daily as needed for heartburn.   Past Week at Unknown time  . saccharomyces boulardii (FLORASTOR) 250 MG capsule Take 1 capsule (250 mg total) by mouth 2 (two) times daily. 60 capsule 0 11/19/2015 at Unknown time  . sodium chloride (OCEAN) 0.65 % SOLN nasal spray Place 1 spray into both nostrils 3 (three) times daily as needed for congestion.   Past Week at Unknown time  . timolol (BETIMOL) 0.5 % ophthalmic solution Place 1 drop into both eyes every morning.    11/19/2015 at Unknown time  . warfarin (COUMADIN) 5 MG tablet Take 1  tablet (5 mg total) by mouth one time only at 6 PM. 30 tablet 0 11/19/2015 at 1800   Scheduled:  . [START ON 11/21/2015] ceftAZIDime (FORTAZ) 2 g IVPB  2 g Intravenous Q T,Th,Sa-HD  . famotidine  20 mg Oral BID  . latanoprost  1 drop Both Eyes QHS  . metoprolol tartrate  25 mg Oral BID  . saccharomyces boulardii  250 mg Oral BID  . sodium chloride flush  3 mL Intravenous Q12H  . timolol  1 drop Both Eyes q morning - 10a    Assessment: 70yo female c/o worsening CP/pressure since yesterday associated w/ SOB and dry cough, initial troponin negative, pt's daughter reports that UTI sometimes causes these sx in pt, UA pending, to transition from Coumadin to heparin for Afib during admission; last dose of Coumadin taken 6/1 prior to arrival, had anticoag clinic visit 5/26 where Coumadin dose was changed.  Goal of Therapy:  Heparin level 0.3-0.7 units/ml   Plan:  Will obtain current INR and dose heparin if/when INR <2.  Wynona Neat, PharmD, BCPS  11/20/2015,5:25 AM

## 2015-11-20 NOTE — Progress Notes (Signed)
Addendum  Discussed with CCM. CT chest suspicious for consolidation/pneumonia. Recommended starting empiric IV antibiotics (vancomycin and Zosyn started) and await thoracentesis when able (INR elevated).  Vernell Leep, MD, FACP, FHM. [facility] Pager [phone]  If 7PM-7AM, please contact night-coverage [email or web address] Password [email or web address] 11/20/2015, 12:02 PM

## 2015-11-20 NOTE — Progress Notes (Signed)
  Pt admitted to OBS  Status for CP/now  SP L thoracentesis / SHE has  ESRD (new start HD TTS  Last HD yest [facility]/  Recently 5/12- 11/11/15  admitted for sepsis secondary to UTI with nephrostomy tube discharged on Fortaz/  With last dose on  11/14/15 per Dr Posey Pronto note . Now in room sp Thoracentesis , no cos walked to Bathroom without cos . Will wrtie for HD  First Shift in AM .      Please notify us if admitted to inpatient status and we will provide full consult       Ernest Haber, PA-C [place] [phone] 11/20/2015,11:00 AM    Pt seen, examined and agree w A/P as above.  Kelly Splinter MD [facility] pager [phone]    cell [phone] 11/20/2015, 4:58 PM

## 2015-11-20 NOTE — Progress Notes (Signed)
[place] for heparin Indication: atrial fibrillation and r/o ACS  Allergies  Allergen Reactions  . Vicodin [Hydrocodone-Acetaminophen] Nausea And Vomiting  . Chlorhexidine Rash    Sunburn    rash  . Percocet [Oxycodone-Acetaminophen] Nausea And Vomiting    Patient Measurements: Height: 5\' 3"  (160 cm) Weight: 177 lb 0.5 oz (80.3 kg) IBW/kg (Calculated) : 52.4  Vital Signs: Temp: 97.8 F (36.6 C) (06/02 0426) Temp Source: Oral (06/02 0426) BP: 106/60 mmHg (06/02 0947) Pulse Rate: 96 (06/02 0947)  Labs:  Recent Labs  11/19/15 2214 11/20/15 0538  HGB 8.8* 8.6*  HCT 28.9* 28.9*  PLT 274 242  LABPROT  --  27.6*  INR  --  2.62*  CREATININE 4.20* 5.02*  TROPONINI  --  <0.03    Estimated Creatinine Clearance: 10.3 mL/min (by C-G formula based on Cr of 5.02).   Medical History: Past Medical History  Diagnosis Date  . GERD (gastroesophageal reflux disease)   . Glaucoma   . H/O renal calculi 2002 & 2006  . H/O hiatal hernia   . Headache(784.0)     migraine  . Pneumonia     dx 10-06-2014 per CXR--  on 10-27-2014 pt states finished antibiotic and denies cough or fever  . Hyperlipidemia   . PAF (paroxysmal atrial fibrillation) ([place])     10/ 2013  in setting of Septic Shock  . Chronic diastolic CHF (congestive heart failure) ([place])     10/ 2013 in setting of septic shock  . Sigmoid diverticulosis   . Nephrolithiasis     bilateral  . Right ureteral stone   . Hypertension     medication removed from regimen due to low blood pressure   . S/P hemodialysis catheter insertion ([place]) 04/11/2015     right anterior chest , only used once   . History of nephrostomy 04/11/2015     currently inplace 04/28/2015   . History of blood transfusion 04/13/2015   . Low iron     hx  . Glaucoma   . History of blood product transfusion   . Complication of anesthesia     use a little anesthesia , per patient MD states she quit breathing (2016);  hard to wake up  . ESRD (end stage renal disease) ([place])   . LV dysfunction     a. h/o LV dysfunction EF 20-25% in 2013 due to sepsis.  . Normal coronary arteries 2014  . Hiatal hernia     a. CT 2017: large gastric hiatal hernia.    Medications:  Prescriptions prior to admission  Medication Sig Dispense Refill Last Dose  . acetaminophen (TYLENOL) 500 MG tablet Take 1,000 mg by mouth every 6 (six) hours as needed for moderate pain or headache.    Past Week at Unknown time  . bimatoprost (LUMIGAN) 0.01 % SOLN Place 1 drop into both eyes at bedtime.   11/18/2015  . cefTAZidime 2 g in dextrose 5 % 50 mL Inject 2 g into the vein Every Tuesday,Thursday,and Saturday with dialysis. 2 gm IV via HD in Tu-Thurs-Sat. Start date was May 18th, 2017 and continue until June 17th, 2017. This will complete 14 days therapy. After that continuation of possible suppressive Antibiotic therapy to be discuss with primary care provider. 10 Syringe 0 11/19/2015 at Unknown time  . guaiFENesin-dextromethorphan (ROBITUSSIN DM) 100-10 MG/5ML syrup Take 5 mLs by mouth every 6 (six) hours as needed for cough. 118 mL 0 Past Week at Unknown time  .  metoprolol tartrate (LOPRESSOR) 25 MG tablet Take 1 tablet (25 mg total) by mouth 2 (two) times daily. 60 tablet 3 11/19/2015 at 1800  . Nutritional Supplements (FEEDING SUPPLEMENT, NEPRO CARB STEADY,) LIQD Take 237 mLs by mouth daily. (Patient taking differently: Take 237 mLs by mouth daily as needed (nutritional supplement). ) 30 Can 0 Past Week at Unknown time  . ranitidine (ZANTAC) 150 MG tablet Take 150 mg by mouth 2 (two) times daily as needed for heartburn.   Past Week at Unknown time  . saccharomyces boulardii (FLORASTOR) 250 MG capsule Take 1 capsule (250 mg total) by mouth 2 (two) times daily. 60 capsule 0 11/19/2015 at Unknown time  . sodium chloride (OCEAN) 0.65 % SOLN nasal spray Place 1 spray into both nostrils 3 (three) times daily as needed for congestion.   Past Week at  Unknown time  . timolol (BETIMOL) 0.5 % ophthalmic solution Place 1 drop into both eyes every morning.    11/19/2015 at Unknown time  . warfarin (COUMADIN) 5 MG tablet Take 1 tablet (5 mg total) by mouth one time only at 6 PM. 30 tablet 0 11/19/2015 at 1800   Scheduled:  . [START ON 11/21/2015] ceftAZIDime (FORTAZ) 2 g IVPB  2 g Intravenous Q T,Th,Sa-HD  . famotidine  20 mg Oral BID  . latanoprost  1 drop Both Eyes QHS  . metoprolol tartrate  25 mg Oral BID  . saccharomyces boulardii  250 mg Oral BID  . sodium chloride flush  3 mL Intravenous Q12H  . timolol  1 drop Both Eyes Daily    Assessment: 70yo female c/o worsening CP/pressure since yesterday associated w/ SOB and dry cough. She is on coumadin PTA for afib and on hold for possible thoracentesis. Pharmacy to dose heparin if INR < 2.0 -INR= 2.63  Goal of Therapy:  Heparin level 0.3-0.7 units/ml   Plan:  -No heparin needed -Daily PT/INR  Hildred Laser, Pharm D 11/20/2015 10:09 AM

## 2015-11-20 NOTE — Progress Notes (Signed)
ANTIBIOTIC CONSULT NOTE - INITIAL  Pharmacy Consult for vancomycin/zosyn Indication: pneumonia  Allergies  Allergen Reactions  . Vicodin [Hydrocodone-Acetaminophen] Nausea And Vomiting  . Chlorhexidine Rash    Sunburn    rash  . Percocet [Oxycodone-Acetaminophen] Nausea And Vomiting    Patient Measurements: Height: 5\' 3"  (160 cm) Weight: 177 lb 0.5 oz (80.3 kg) IBW/kg (Calculated) : 52.4   Vital Signs: Temp: 97.8 F (36.6 C) (06/02 0426) Temp Source: Oral (06/02 0426) BP: 106/60 mmHg (06/02 0947) Pulse Rate: 96 (06/02 0947) Intake/Output from previous day:   Intake/Output from this shift: Total I/O In: [id number] [P.O.:24050] Out: -   Labs:  Recent Labs  11/19/15 2214 11/20/15 0538  WBC 7.5 5.3  HGB 8.8* 8.6*  PLT 274 242  CREATININE 4.20* 5.02*   Estimated Creatinine Clearance: 10.3 mL/min (by C-G formula based on Cr of 5.02). No results for input(s): VANCOTROUGH, VANCOPEAK, VANCORANDOM, GENTTROUGH, GENTPEAK, GENTRANDOM, TOBRATROUGH, TOBRAPEAK, TOBRARND, AMIKACINPEAK, AMIKACINTROU, AMIKACIN in the last 72 hours.   Microbiology: Recent Results (from the past 720 hour(s))  Culture, blood (routine x 2)     Status: None   Collection Time: 10/31/15 12:10 AM  Result Value Ref Range Status   Specimen Description BLOOD RIGHT FOREARM  Final   Special Requests BOTTLES DRAWN AEROBIC AND ANAEROBIC 5CC  Final   Culture   Final    NO GROWTH 5 DAYS Performed at [facility]    Report Status 11/05/2015 FINAL  Final  Urine culture     Status: Abnormal   Collection Time: 10/31/15 12:34 AM  Result Value Ref Range Status   Specimen Description URINE, RANDOM  Final   Special Requests NONE  Final   Culture >=100,000 COLONIES/mL PSEUDOMONAS AERUGINOSA (A)  Final   Report Status 11/03/2015 FINAL  Final   Organism ID, Bacteria PSEUDOMONAS AERUGINOSA (A)  Final      Susceptibility   Pseudomonas aeruginosa - MIC*    CEFTAZIDIME 4 SENSITIVE Sensitive     CIPROFLOXACIN 0.5  INTERMEDIATE Intermediate     GENTAMICIN 2 SENSITIVE Sensitive     IMIPENEM 1 SENSITIVE Sensitive     PIP/TAZO 16 SENSITIVE Sensitive     CEFEPIME 2 SENSITIVE Sensitive     * >=100,000 COLONIES/mL PSEUDOMONAS AERUGINOSA  Culture, blood (routine x 2)     Status: None   Collection Time: 10/31/15 12:56 AM  Result Value Ref Range Status   Specimen Description BLOOD RIGHT ARM  Final   Special Requests BOTTLES DRAWN AEROBIC AND ANAEROBIC 10CC  Final   Culture   Final    NO GROWTH 5 DAYS Performed at [facility]    Report Status 11/05/2015 FINAL  Final  MRSA PCR Screening     Status: None   Collection Time: 10/31/15  7:37 AM  Result Value Ref Range Status   MRSA by PCR NEGATIVE NEGATIVE Final    Comment:        The GeneXpert MRSA Assay (FDA approved for NASAL specimens only), is one component of a comprehensive MRSA colonization surveillance program. It is not intended to diagnose MRSA infection nor to guide or monitor treatment for MRSA infections.     Medical History: Past Medical History  Diagnosis Date  . GERD (gastroesophageal reflux disease)   . Glaucoma   . H/O renal calculi 2002 & 2006  . H/O hiatal hernia   . Headache(784.0)     migraine  . Pneumonia     dx 10-06-2014 per CXR--  on 10-27-2014 pt  states finished antibiotic and denies cough or fever  . Hyperlipidemia   . PAF (paroxysmal atrial fibrillation) ([place])     10/ 2013  in setting of Septic Shock  . Chronic diastolic CHF (congestive heart failure) ([place])     10/ 2013 in setting of septic shock  . Sigmoid diverticulosis   . Nephrolithiasis     bilateral  . Right ureteral stone   . Hypertension     medication removed from regimen due to low blood pressure   . S/P hemodialysis catheter insertion ([place]) 04/11/2015     right anterior chest , only used once   . History of nephrostomy 04/11/2015     currently inplace 04/28/2015   . History of blood transfusion 04/13/2015   . Low iron     hx  .  Glaucoma   . History of blood product transfusion   . Complication of anesthesia     use a little anesthesia , per patient MD states she quit breathing (2016); hard to wake up  . ESRD (end stage renal disease) ([place])   . LV dysfunction     a. h/o LV dysfunction EF 20-25% in 2013 due to sepsis.  . Normal coronary arteries 2014  . Hiatal hernia     a. CT 2017: large gastric hiatal hernia.    Medications:  Prescriptions prior to admission  Medication Sig Dispense Refill Last Dose  . acetaminophen (TYLENOL) 500 MG tablet Take 1,000 mg by mouth every 6 (six) hours as needed for moderate pain or headache.    Past Week at Unknown time  . bimatoprost (LUMIGAN) 0.01 % SOLN Place 1 drop into both eyes at bedtime.   11/18/2015  . cefTAZidime 2 g in dextrose 5 % 50 mL Inject 2 g into the vein Every Tuesday,Thursday,and Saturday with dialysis. 2 gm IV via HD in Tu-Thurs-Sat. Start date was May 18th, 2017 and continue until June 17th, 2017. This will complete 14 days therapy. After that continuation of possible suppressive Antibiotic therapy to be discuss with primary care provider. 10 Syringe 0 11/19/2015 at Unknown time  . guaiFENesin-dextromethorphan (ROBITUSSIN DM) 100-10 MG/5ML syrup Take 5 mLs by mouth every 6 (six) hours as needed for cough. 118 mL 0 Past Week at Unknown time  . metoprolol tartrate (LOPRESSOR) 25 MG tablet Take 1 tablet (25 mg total) by mouth 2 (two) times daily. 60 tablet 3 11/19/2015 at 1800  . Nutritional Supplements (FEEDING SUPPLEMENT, NEPRO CARB STEADY,) LIQD Take 237 mLs by mouth daily. (Patient taking differently: Take 237 mLs by mouth daily as needed (nutritional supplement). ) 30 Can 0 Past Week at Unknown time  . ranitidine (ZANTAC) 150 MG tablet Take 150 mg by mouth 2 (two) times daily as needed for heartburn.   Past Week at Unknown time  . saccharomyces boulardii (FLORASTOR) 250 MG capsule Take 1 capsule (250 mg total) by mouth 2 (two) times daily. 60 capsule 0 11/19/2015 at  Unknown time  . sodium chloride (OCEAN) 0.65 % SOLN nasal spray Place 1 spray into both nostrils 3 (three) times daily as needed for congestion.   Past Week at Unknown time  . timolol (BETIMOL) 0.5 % ophthalmic solution Place 1 drop into both eyes every morning.    11/19/2015 at Unknown time  . warfarin (COUMADIN) 5 MG tablet Take 1 tablet (5 mg total) by mouth one time only at 6 PM. 30 tablet 0 11/19/2015 at 1800   Scheduled:  . amiodarone  400 mg Oral BID  Followed by  . [START ON 11/27/2015] amiodarone  400 mg Oral Daily  . famotidine  20 mg Oral BID  . latanoprost  1 drop Both Eyes QHS  . metoprolol tartrate  25 mg Oral BID  . saccharomyces boulardii  250 mg Oral BID  . sodium chloride flush  3 mL Intravenous Q12H  . timolol  1 drop Both Eyes Daily    Assessment: 70 yo female with concern for PNA and noted on fortaz PTA for pseudomonas UTI (5/13 urine cultures; pansensitive). He is noted with ESRD on HD TTS (no HD orders yet). Pharmacy consulted to dose vancomycin and zosyn   6/2 vanc 6/2 zosyn  Cefepime 5/13 >>5/18 Ceftazidime (started last admit) 5/18>>6/2  6/2 urine 6/2 blood x2   Goal of Therapy:  Pre-HD vancomycin level= 15-20  Plan:  -Zosyn 2.75gm IV q8h -Vancomycin 1500mg  IV followed by 750mg  IV TTS -Will follow HD schedule, cultures and clinical progress  Hildred Laser, Pharm D 11/20/2015 1:11 PM

## 2015-11-20 NOTE — H&P (Signed)
History and Physical    [name] [id number] DOB: 1946/02/21 DOA: 11/20/2015  PCP: [name], MD  Patient coming from: Home.  Chief Complaint: Chest pain.  HPI: [name] is a 70 y.o. female with medical history significant of recently admitted for sepsis secondary to UTI with nephrostomy tube discharged on Fortaz and during admission patient also was in A. fib with RVR presents to the ER after patient started developing chest pressure last evening after dialysis. Patient's pain and discomfort is mostly the left side of the chest with nonproductive cough. Patient states she has been having nonproductive cough for last 1 week. Denies any fever chills nausea vomiting or diarrhea. Chest pressure and pain is mostly persistent nonradiating has no relation to exertion. X-rays in the ER shows moderate left pleural effusion with trace right-sided pleural effusion. EKG shows normal sinus rhythm with low voltage. Patient has been admitted for further management.   ED Course: See history of present illness.  Review of Systems: As per HPI otherwise 10 point review of systems negative.    Past Medical History  Diagnosis Date  . GERD (gastroesophageal reflux disease)   . Glaucoma   . H/O renal calculi 2002 & 2006  . H/O hiatal hernia   . Headache(784.0)     migraine  . Pneumonia     dx 10-06-2014 per CXR--  on 10-27-2014 pt states finished antibiotic and denies cough or fever  . Hyperlipidemia   . History of atrial fibrillation     10/ 2013  in setting of Septic Shock  . History of CHF (congestive heart failure)     10/ 2013 in setting of septic shock  . Sigmoid diverticulosis   . Nephrolithiasis     bilateral  . CKD (chronic kidney disease), stage III     nephrologist-  dr Florene Glen  . Right ureteral stone   . Hypertension     medication removed from regimen due to low blood pressure   . Peripheral vascular disease ([place])   . S/P hemodialysis catheter insertion ([place])  04/11/2015     right anterior chest , only used once   . History of nephrostomy 04/11/2015     currently inplace 04/28/2015   . History of blood transfusion 04/13/2015   . Low iron     hx  . Glaucoma   . History of blood product transfusion   . Complication of anesthesia     use a little anesthesia , per patient MD states she quit breathing (2016); hard to wake up  . ESRD (end stage renal disease) [facility])     Past Surgical History  Procedure Laterality Date  . Cystoscopy w/ ureteral stent placement  04/04/2012    Procedure: CYSTOSCOPY WITH RETROGRADE PYELOGRAM/URETERAL STENT PLACEMENT;  Surgeon: Ailene Rud, MD;  Location: [place];  Service: Urology;  Laterality: Left;  . Total abdominal hysterectomy w/ bilateral salpingoophorectomy  1993    secondary to fibroids  . Breast biopsy Left 08/23/07    benign fibrocystic with duct ectasia  . Cardiac catheterization  07-11-2012  dr Irish Lack    Abnormal stress test/   normal coronary arteries/  LVEDP  28mmHg  . Cardiovascular stress test  06-26-2012  dr Irish Lack    marked ischemia in the basal anterior, mid anterior, apical inferior regions/  normal LVF, ef 63%  . Transthoracic echocardiogram  04-09-2012    normal LVF,  ef 60-65%,  mild LAE,  mild TR, trivial MR and PR  .  Cataract extraction w/ intraocular lens  implant, bilateral    . Knee arthroscopy Left 02-14-2003  . Laparoscopic cholecystectomy  03-23-2005  . Extracorporeal shock wave lithotripsy  05-28-2012  &  10-08-2012  . Cystoscopy with stent placement Right 10/28/2014    Procedure: RIGHT URETERAL STENT PLACEMENT;  Surgeon: Irine Seal, MD;  Location[facility];  Service: Urology;  Laterality: Right;  . Cystoscopy/retrograde/ureteroscopy/stone extraction with basket Right 11/21/2014    Procedure: CYSTOSCOPY/RIGHT RETROGRADE PYELOGRAM/RIGHT URETEROSCOPY/BASKET EXTRACTION/RIGHT PYELOSCOPY/LASER OF STONE/RIGHT DOUBLE J STENT;  Surgeon: Carolan Clines, MD;   Location: [place];  Service: Urology;  Laterality: Right;  . Holmium laser application Right [id number]    Procedure: HOLMIUM LASER APPLICATION;  Surgeon: Carolan Clines, MD;  Location: [facility];  Service: Urology;  Laterality: Right;  . Cystoscopy with stent placement Right 02/26/2015    Procedure: CYSTOSCOPY RETROGRADE PYELOGRAM WITH STENT PLACEMENT;  Surgeon: Cleon Gustin, MD;  Location: [facility];  Service: Urology;  Laterality: Right;  . Cystoscopy w/ ureteral stent placement Bilateral 05/04/2015    Procedure: CYSTOSCOPY WITH BILATERAL RETROGRADE PYELOGRAM/ WITH INTERPRETATION, EXCHANGE OF RIGHT URETERAL STENT REPLACEMENT AND PLACEMENT LEFT URETERAL STENT PLACEMENT EXAMINATION OF VAGINA;  Surgeon: Carolan Clines, MD;  Location: [facility];  Service: Urology;  Laterality: Bilateral;  . Av fistula placement Left 06/02/2015    Procedure: BRACHIOCEPHALIC ARTERIOVENOUS (AV) FISTULA CREATION ;  Surgeon: Conrad [place] Hills, MD;  Location: [place];  Service: Vascular;  Laterality: Left;  . Abdominal hysterectomy    . Bascilic vein transposition Left 07/27/2015    Procedure: FIRST STAGE BASILIC VEIN TRANSPOSITION LEFT UPPER ARM;  Surgeon: Conrad Elkhorn, MD;  Location: [place];  Service: Vascular;  Laterality: Left;  . Bascilic vein transposition Left 09/2015    second phase  . Bascilic vein transposition Left 10/12/2015    Procedure: SECOND STAGE BASILIC VEIN TRANSPOSITION LEFT ARM;  Surgeon: Conrad Goulds, MD;  Location: [place];  Service: Vascular;  Laterality: Left;     reports that she has never smoked. She has never used smokeless tobacco. She reports that she does not drink alcohol or use illicit drugs.  Allergies  Allergen Reactions  . Vicodin [Hydrocodone-Acetaminophen] Nausea And Vomiting  . Chlorhexidine Rash    Sunburn    rash  . Percocet [Oxycodone-Acetaminophen] Nausea And Vomiting    Family History  Problem Relation Age of Onset  . Hypertension Mother     . Cancer Mother 34    breast  . Dementia Mother   . Hypertension Brother   . Diabetes Brother   . Heart disease Brother     before age 13  . Cancer Father 84    pancreatic  . Heart failure Paternal Grandmother   . Bladder Cancer Maternal Grandfather     Prior to Admission medications   Medication Sig Start Date End Date Taking? Authorizing Provider  acetaminophen (TYLENOL) 500 MG tablet Take 1,000 mg by mouth every 6 (six) hours as needed for moderate pain or headache.    Yes Historical Provider, MD  bimatoprost (LUMIGAN) 0.01 % SOLN Place 1 drop into both eyes at bedtime.   Yes Historical Provider, MD  cefTAZidime 2 g in dextrose 5 % 50 mL Inject 2 g into the vein Every Tuesday,Thursday,and Saturday with dialysis. 2 gm IV via HD in Tu-Thurs-Sat. Start date was May 18th, 2017 and continue until June 17th, 2017. This will complete 14 days therapy. After that continuation of possible suppressive Antibiotic therapy  to be discuss with primary care provider. 11/11/15  Yes Theodis Blaze, MD  guaiFENesin-dextromethorphan (ROBITUSSIN DM) 100-10 MG/5ML syrup Take 5 mLs by mouth every 6 (six) hours as needed for cough. 11/11/15  Yes Theodis Blaze, MD  metoprolol tartrate (LOPRESSOR) 25 MG tablet Take 1 tablet (25 mg total) by mouth 2 (two) times daily. 11/11/15  Yes Theodis Blaze, MD  Nutritional Supplements (FEEDING SUPPLEMENT, NEPRO CARB STEADY,) LIQD Take 237 mLs by mouth daily. Patient taking differently: Take 237 mLs by mouth daily as needed (nutritional supplement).  05/07/15  Yes Maryann Mikhail, DO  ranitidine (ZANTAC) 150 MG tablet Take 150 mg by mouth 2 (two) times daily as needed for heartburn.   Yes Historical Provider, MD  saccharomyces boulardii (FLORASTOR) 250 MG capsule Take 1 capsule (250 mg total) by mouth 2 (two) times daily. 07/08/15  Yes Donne Hazel, MD  sodium chloride (OCEAN) 0.65 % SOLN nasal spray Place 1 spray into both nostrils 3 (three) times daily as needed for congestion.    Yes Historical Provider, MD  timolol (BETIMOL) 0.5 % ophthalmic solution Place 1 drop into both eyes every morning.    Yes Historical Provider, MD  warfarin (COUMADIN) 5 MG tablet Take 1 tablet (5 mg total) by mouth one time only at 6 PM. 11/11/15  Yes Theodis Blaze, MD    Physical Exam: Filed Vitals:   11/20/15 0010 11/20/15 0230 11/20/15 0300 11/20/15 0426  BP: 97/55 108/54 114/57 120/57  Pulse: 94 84 82 83  Temp: 98 F (36.7 C)   97.8 F (36.6 C)  TempSrc: Oral   Oral  Resp: 20 17 19 18   Height:    5\' 3"  (1.6 m)  Weight:    177 lb 0.5 oz (80.3 kg)  SpO2: 96% 94% 96% 93%      Constitutional: Not in acute distress. Filed Vitals:   11/20/15 0010 11/20/15 0230 11/20/15 0300 11/20/15 0426  BP: 97/55 108/54 114/57 120/57  Pulse: 94 84 82 83  Temp: 98 F (36.7 C)   97.8 F (36.6 C)  TempSrc: Oral   Oral  Resp: 20 17 19 18   Height:    5\' 3"  (1.6 m)  Weight:    177 lb 0.5 oz (80.3 kg)  SpO2: 96% 94% 96% 93%   Eyes: Anicteric no pallor. ENMT: No discharge from the ears eyes nose and mouth. Neck: No mass felt. No JVD appreciated. Respiratory: No rhonchi or crepitations. Cardiovascular: S1 and S2 heard. Abdomen: Soft nontender bowel sounds present. Right-sided nephrostomy tube seen. Musculoskeletal: No edema. Skin: No rash. Neurologic: Alert awake oriented to time place and person. Moves all extremities. Psychiatric: Appears normal.   Labs on Admission: I have personally reviewed following labs and imaging studies  CBC:  Recent Labs Lab 11/19/15 2214  WBC 7.5  HGB 8.8*  HCT 28.9*  MCV 87.6  PLT 123456   Basic Metabolic Panel:  Recent Labs Lab 11/19/15 2214  NA 132*  K 3.6  CL 99*  CO2 25  GLUCOSE 123*  BUN 19  CREATININE 4.20*  CALCIUM 7.9*   GFR: Estimated Creatinine Clearance: 12.3 mL/min (by C-G formula based on Cr of 4.2). Liver Function Tests: No results for input(s): AST, ALT, ALKPHOS, BILITOT, PROT, ALBUMIN in the last 168 hours. No results  for input(s): LIPASE, AMYLASE in the last 168 hours. No results for input(s): AMMONIA in the last 168 hours. Coagulation Profile:  Recent Labs Lab 11/13/15 1035  INR 3.3  Cardiac Enzymes: No results for input(s): CKTOTAL, CKMB, CKMBINDEX, TROPONINI in the last 168 hours. BNP (last 3 results) No results for input(s): PROBNP in the last 8760 hours. HbA1C: No results for input(s): HGBA1C in the last 72 hours. CBG: No results for input(s): GLUCAP in the last 168 hours. Lipid Profile: No results for input(s): CHOL, HDL, LDLCALC, TRIG, CHOLHDL, LDLDIRECT in the last 72 hours. Thyroid Function Tests: No results for input(s): TSH, T4TOTAL, FREET4, T3FREE, THYROIDAB in the last 72 hours. Anemia Panel: No results for input(s): VITAMINB12, FOLATE, FERRITIN, TIBC, IRON, RETICCTPCT in the last 72 hours. Urine analysis:    Component Value Date/Time   COLORURINE YELLOW 10/31/2015 0034   APPEARANCEUR TURBID* 10/31/2015 0034   LABSPEC 1.011 10/31/2015 0034   PHURINE 6.0 10/31/2015 0034   GLUCOSEU NEGATIVE 10/31/2015 0034   HGBUR LARGE* 10/31/2015 0034   BILIRUBINUR NEGATIVE 10/31/2015 0034   BILIRUBINUR neg 09/18/2014 0920   KETONESUR NEGATIVE 10/31/2015 0034   PROTEINUR 100* 10/31/2015 0034   PROTEINUR 2+ 09/18/2014 0920   UROBILINOGEN 0.2 05/02/2015 2020   UROBILINOGEN negative 09/18/2014 0920   NITRITE NEGATIVE 10/31/2015 0034   NITRITE neg 09/18/2014 0920   LEUKOCYTESUR LARGE* 10/31/2015 0034   Sepsis Labs: @LABRCNTIP (procalcitonin:4,lacticidven:4) )No results found for this or any previous visit (from the past 240 hour(s)).   Radiological Exams on Admission: Dg Chest 2 View  11/19/2015  CLINICAL DATA:  Left-sided chest pain. EXAM: CHEST  2 VIEW COMPARISON:  10/31/2015 FINDINGS: Right-sided dialysis catheter tip in the SVC. Development of moderate size left pleural effusion from prior. There is a retrocardiac hiatal hernia. Small right pleural effusion. Cardiomediastinal contours  are unchanged, partially obscured by pleural fluid. Mild chronic bronchitic changes without pulmonary edema. No pneumothorax. IMPRESSION: 1. Moderate left and trace right pleural effusions, new from prior exam. 2. Hiatal hernia. Electronically Signed   By: Jeb Levering M.D.   On: 11/19/2015 23:23    EKG: Independently reviewed. Normal sinus rhythm low voltage.  Assessment/Plan Principal Problem:   Chest pain Active Problems:   Essential hypertension, benign   End stage renal disease (HCC)   Anemia of renal disease   PAF (paroxysmal atrial fibrillation) (H[facility])   Pleural effusion, left    #1. Chest pain - given that patient has moderate pleural effusion which is new, could be contributing to patient's chest discomfort. At this time I have ordered a CT chest and if shows significant pleural effusion or loculation may need thoracentesis for further studies. Patient is also on empiric antibiotics for now for UTI sepsis recently. Cycle cardiac markers and check d-dimer. For now I will place patient on heparin instead of Coumadin in case patient needs thoracentesis. Recent 2-D echo showed EF of 55-60% with grade 1 diastolic dysfunction. #2. ESRD on hemodialysis, patient is new to dialysis since last admission 2 weeks ago - gets dialyzed on Tuesday Thursday and Saturday and had dialysis yesterday. Closely follow intake output and metabolic panel. Nephrology consult for dialysis. #3. Paroxysmal atrial fibrillation - chads 2 vasc score is 2. Patient is on Coumadin which will be held for heparin for possible procedure (paracentesis). Continue metoprolol. #4. Chronic anemia probably from ESRD - follow CBC. #5. Hypertension - Continue metoprolol.   #6. Recent admission for sepsis from UTI with nephrostomy tube  - patient is on [place]. Patient is supposed to be on [place] of complete course of 14 days. I will check with pharmacy for the end date. Following which patient is to be on suppressive antibiotic.  DVT prophylaxis: Heparin. Code Status: Full code. Family Communication: No family at the bedside.  Disposition Plan: Home.  Consults called: None.  Admission status: Observation. Telemetry.    Rise Patience MD [facility] Pager [phone].  If 7PM-7AM, please contact night-coverage [email or web address] Password [email or web address]  11/20/2015, 5:25 AM

## 2015-11-20 NOTE — Consult Note (Signed)
Cardiology Consultation Note    Patient ID: [name], MRN: [id number], DOB/AGE: 12/05/1945 70 y.o. Admit date: 11/20/2015   Date of Consult: 11/20/2015 Primary Physician: [name], MD Primary Cardiologist: Irish Lack  Chief Complaint: chest pain Reason for Consultation: chest pain Requesting MD: Dr. Algis Liming  HPI: [name] is a 70 y/o F with history of HTN, ESRD now on HD T/Th/Sat, paroxysmal atrial fibrillation, HLD, h/o LV dysfunction, normal cors [id number], chronic diastolic CHF, recurrent sepsis s/p perc nephrostomy, chronic anemia whom we are asked to see for atrial fib.   Per review of chart she has had prior issues with sepsis c/b LV dysfunction, type II MI, and transient atrial fib, initially in 2013. EF was 20-25% at that time but recovered quickly. She has required prior ureteral stenting due to infection from kidney stones. In 07/2012 she had an abnormal nuclear stress test followed by a normal cardiac cath with no CAD. She was taken off amiodarone. She has progressed to ESRD this year. She has had 7 admissions in the last 6 months most often for acute UTI/pyelo with sepsis. She was seen by cardiology during her last admission 5/12-5/24 for atrial fib with RVR. She was treated with amiodarone - initially did not convert but per Dr. Lysbeth Penner last note she had converted to NSR and amiodarone was stopped. Metoprolol was titrated. TSH wnl 11/05/15. She was discharged on Coumadin as well. 2D Echo 11/03/15: mild LVH, EF 55-60%, grade 1 DD, severely dilated LA, mild TR, PASP 42.   She was readmitted overnight with chest pain. Yesterday after she got home from dialysis he was relaxing and around 6pm developed focal left sided chest discomfort without radiation, nausea, vomiting, or palpitations. It did not feel like when she has gone into afib in the past. Symptoms persisted (initially 8/10) so she sought medical care. She was still having the discomfort in the ER when EKG showed NSR. It  is somewhat worse with inspiration. Not worse with palpation or exertion although she hasn't done much. She has noticed some dyspnea since yesterday. No LEE or syncope. She did not try anything to ease the pain; it spontaneously improved to a 2/10 but has remained constant ever since yesterday. CXR showed moderate L pleural effusion and trace R pleural effusion new from prior. CT without contrast showed left lung base consolidation with laying pleural effusion favored to represent pneumonia, with small but intermediate density pericardial effusion new since November (consider pericarditis), chronic large gastric hiatal hernia, calcified coronary atherosclerosis. Labs pertinent for Hgb 8.6 (prev 9-10), albumin 2.3, troponin neg x 2, INR 2.62, d-dimer 3.25. She is afebrile with normal WBC but has had a dry cough ever since last D/c. She also reports a 40lb weight loss in the last year which she attributes to frequent hospitalizations disrupting her usual food pattern and appetite.  This morning in the early morning hours she went into PAF (with brief returns to sinus) initially with high rates spiking to 140s but now currently in the 90s without particular intervention other than taking scheduled oral metoprolol. She has had issues with her BP dropping at HD and was told by nephro outpatient PA to inquire about whether a dose adjustment was necessary for metoprolol given concomitant timolol. IM is holding Coumadin and putting her on heparin while considering thoracentesis.   Past Medical History  Diagnosis Date  . GERD (gastroesophageal reflux disease)   . Glaucoma   . H/O renal calculi 2002 & 2006  .  H/O hiatal hernia   . Headache(784.0)     migraine  . Pneumonia     dx 10-06-2014 per CXR--  on 10-27-2014 pt states finished antibiotic and denies cough or fever  . Hyperlipidemia   . PAF (paroxysmal atrial fibrillation) ([place])     10/ 2013  in setting of Septic Shock  . Chronic diastolic CHF  (congestive heart failure) ([place])     10/ 2013 in setting of septic shock  . Sigmoid diverticulosis   . Nephrolithiasis     bilateral  . Right ureteral stone   . Hypertension     medication removed from regimen due to low blood pressure   . S/P hemodialysis catheter insertion ([place]) 04/11/2015     right anterior chest , only used once   . History of nephrostomy 04/11/2015     currently inplace 04/28/2015   . History of blood transfusion 04/13/2015   . Low iron     hx  . Glaucoma   . History of blood product transfusion   . Complication of anesthesia     use a little anesthesia , per patient MD states she quit breathing (2016); hard to wake up  . ESRD (end stage renal disease) ([place])   . LV dysfunction     a. h/o LV dysfunction EF 20-25% in 2013 due to sepsis.  . Normal coronary arteries 2014  . Hiatal hernia     a. CT 2017: large gastric hiatal hernia.      Surgical History:  Past Surgical History  Procedure Laterality Date  . Cystoscopy w/ ureteral stent placement  04/04/2012    Procedure: CYSTOSCOPY WITH RETROGRADE PYELOGRAM/URETERAL STENT PLACEMENT;  Surgeon: Ailene Rud, MD;  Location: [place];  Service: Urology;  Laterality: Left;  . Total abdominal hysterectomy w/ bilateral salpingoophorectomy  1993    secondary to fibroids  . Breast biopsy Left 08/23/07    benign fibrocystic with duct ectasia  . Cardiac catheterization  07-11-2012  dr Irish Lack    Abnormal stress test/   normal coronary arteries/  LVEDP  15mmHg  . Cardiovascular stress test  06-26-2012  dr Irish Lack    marked ischemia in the basal anterior, mid anterior, apical inferior regions/  normal LVF, ef 63%  . Transthoracic echocardiogram  04-09-2012    normal LVF,  ef 60-65%,  mild LAE,  mild TR, trivial MR and PR  . Cataract extraction w/ intraocular lens  implant, bilateral    . Knee arthroscopy Left 02-14-2003  . Laparoscopic cholecystectomy  03-23-2005  . Extracorporeal shock wave lithotripsy   05-28-2012  &  10-08-2012  . Cystoscopy with stent placement Right 10/28/2014    Procedure: RIGHT URETERAL STENT PLACEMENT;  Surgeon: Irine Seal, MD;  Location: [facility];  Service: Urology;  Laterality: Right;  . Cystoscopy/retrograde/ureteroscopy/stone extraction with basket Right 11/21/2014    Procedure: CYSTOSCOPY/RIGHT RETROGRADE PYELOGRAM/RIGHT URETEROSCOPY/BASKET EXTRACTION/RIGHT PYELOSCOPY/LASER OF STONE/RIGHT DOUBLE J STENT;  Surgeon: Carolan Clines, MD;  Location: [place];  Service: Urology;  Laterality: Right;  . Holmium laser application Right [id number]    Procedure: HOLMIUM LASER APPLICATION;  Surgeon: Carolan Clines, MD;  Location: [facility];  Service: Urology;  Laterality: Right;  . Cystoscopy with stent placement Right 02/26/2015    Procedure: CYSTOSCOPY RETROGRADE PYELOGRAM WITH STENT PLACEMENT;  Surgeon: Cleon Gustin, MD;  Location: [facility];  Service: Urology;  Laterality: Right;  . Cystoscopy w/ ureteral stent placement Bilateral 05/04/2015    Procedure: CYSTOSCOPY WITH  BILATERAL RETROGRADE PYELOGRAM/ WITH INTERPRETATION, EXCHANGE OF RIGHT URETERAL STENT REPLACEMENT AND PLACEMENT LEFT URETERAL STENT PLACEMENT EXAMINATION OF VAGINA;  Surgeon: Carolan Clines, MD;  Location: [facility];  Service: Urology;  Laterality: Bilateral;  . Av fistula placement Left 06/02/2015    Procedure: BRACHIOCEPHALIC ARTERIOVENOUS (AV) FISTULA CREATION ;  Surgeon: Conrad Gorham, MD;  Location: [place];  Service: Vascular;  Laterality: Left;  . Abdominal hysterectomy    . Bascilic vein transposition Left 07/27/2015    Procedure: FIRST STAGE BASILIC VEIN TRANSPOSITION LEFT UPPER ARM;  Surgeon: Conrad [place], MD;  Location: [place];  Service: Vascular;  Laterality: Left;  . Bascilic vein transposition Left 09/2015    second phase  . Bascilic vein transposition Left 10/12/2015    Procedure: SECOND STAGE BASILIC VEIN TRANSPOSITION LEFT ARM;  Surgeon: Conrad Kinde, MD;  Location: [place];  Service: Vascular;  Laterality: Left;     Home Meds: Prior to Admission medications   Medication Sig Start Date End Date Taking? Authorizing Provider  acetaminophen (TYLENOL) 500 MG tablet Take 1,000 mg by mouth every 6 (six) hours as needed for moderate pain or headache.    Yes Historical Provider, MD  bimatoprost (LUMIGAN) 0.01 % SOLN Place 1 drop into both eyes at bedtime.   Yes Historical Provider, MD  cefTAZidime 2 g in dextrose 5 % 50 mL Inject 2 g into the vein Every Tuesday,Thursday,and Saturday with dialysis. 2 gm IV via HD in Tu-Thurs-Sat. Start date was May 18th, 2017 and continue until June 17th, 2017. This will complete 14 days therapy. After that continuation of possible suppressive Antibiotic therapy to be discuss with primary care provider. 11/11/15  Yes Theodis Blaze, MD  guaiFENesin-dextromethorphan (ROBITUSSIN DM) 100-10 MG/5ML syrup Take 5 mLs by mouth every 6 (six) hours as needed for cough. 11/11/15  Yes Theodis Blaze, MD  metoprolol tartrate (LOPRESSOR) 25 MG tablet Take 1 tablet (25 mg total) by mouth 2 (two) times daily. 11/11/15  Yes Theodis Blaze, MD  Nutritional Supplements (FEEDING SUPPLEMENT, NEPRO CARB STEADY,) LIQD Take 237 mLs by mouth daily. Patient taking differently: Take 237 mLs by mouth daily as needed (nutritional supplement).  05/07/15  Yes Maryann Mikhail, DO  ranitidine (ZANTAC) 150 MG tablet Take 150 mg by mouth 2 (two) times daily as needed for heartburn.   Yes Historical Provider, MD  saccharomyces boulardii (FLORASTOR) 250 MG capsule Take 1 capsule (250 mg total) by mouth 2 (two) times daily. 07/08/15  Yes Donne Hazel, MD  sodium chloride (OCEAN) 0.65 % SOLN nasal spray Place 1 spray into both nostrils 3 (three) times daily as needed for congestion.   Yes Historical Provider, MD  timolol (BETIMOL) 0.5 % ophthalmic solution Place 1 drop into both eyes every morning.    Yes Historical Provider, MD  warfarin (COUMADIN) 5 MG  tablet Take 1 tablet (5 mg total) by mouth one time only at 6 PM. 11/11/15  Yes Theodis Blaze, MD    Inpatient Medications:  . [START ON 11/21/2015] ceftAZIDime (FORTAZ) 2 g IVPB  2 g Intravenous Q T,Th,Sa-HD  . famotidine  20 mg Oral BID  . latanoprost  1 drop Both Eyes QHS  . metoprolol tartrate  25 mg Oral BID  . saccharomyces boulardii  250 mg Oral BID  . sodium chloride flush  3 mL Intravenous Q12H  . timolol  1 drop Both Eyes Daily      Allergies:  Allergies  Allergen Reactions  .  Vicodin [Hydrocodone-Acetaminophen] Nausea And Vomiting  . Chlorhexidine Rash    Sunburn    rash  . Percocet [Oxycodone-Acetaminophen] Nausea And Vomiting    Social History   Social History  . Marital Status: Widowed    Spouse Name: N/A  . Number of Children: 1  . Years of Education: N/A   Occupational History  . retired   . legal assistant    Social History Main Topics  . Smoking status: Never Smoker   . Smokeless tobacco: Never Used  . Alcohol Use: No  . Drug Use: No  . Sexual Activity: No     Comment: widow husband passed 5/05 with lung cancer   Other Topics Concern  . Not on file   Social History Narrative     Family History  Problem Relation Age of Onset  . Hypertension Mother   . Cancer Mother 78    breast  . Dementia Mother   . Hypertension Brother   . Diabetes Brother   . Heart disease Brother     before age 57  . Cancer Father 76    pancreatic  . Heart failure Paternal Grandmother   . Bladder Cancer Maternal Grandfather      Review of Systems: All other systems reviewed and are otherwise negative except as noted above.  Labs:  Recent Labs  11/20/15 0538  TROPONINI <0.03   Lab Results  Component Value Date   WBC 5.3 11/20/2015   HGB 8.6* 11/20/2015   HCT 28.9* 11/20/2015   MCV 89.5 11/20/2015   PLT 242 11/20/2015    Recent Labs Lab 11/20/15 0538  NA 134*  K 3.6  CL 98*  CO2 26  BUN 22*  CREATININE 5.02*  CALCIUM 8.5*  PROT 5.7*  BILITOT  0.4  ALKPHOS 63  ALT 14  AST 11*  GLUCOSE 94   Lab Results  Component Value Date   CHOL 123 07/04/2015   HDL 43 07/04/2015   LDLCALC 63 07/04/2015   TRIG 84 07/04/2015   Lab Results  Component Value Date   DDIMER 3.25* 11/20/2015    Radiology/Studies:  Dg Chest 2 View  11/19/2015  CLINICAL DATA:  Left-sided chest pain. EXAM: CHEST  2 VIEW COMPARISON:  10/31/2015 FINDINGS: Right-sided dialysis catheter tip in the SVC. Development of moderate size left pleural effusion from prior. There is a retrocardiac hiatal hernia. Small right pleural effusion. Cardiomediastinal contours are unchanged, partially obscured by pleural fluid. Mild chronic bronchitic changes without pulmonary edema. No pneumothorax. IMPRESSION: 1. Moderate left and trace right pleural effusions, new from prior exam. 2. Hiatal hernia. Electronically Signed   By: Jeb Levering M.D.   On: 11/19/2015 23:23   Ct Chest Wo Contrast  11/20/2015  CLINICAL DATA:  70 year old female with left side chest pain. Pleural effusions. End-stage renal disease. Initial encounter. EXAM: CT CHEST WITHOUT CONTRAST TECHNIQUE: Multidetector CT imaging of the chest was performed following the standard protocol without IV contrast. COMPARISON:  Chest radiographs 11/19/2015 and earlier. CT Abdomen and Pelvis 05/02/2015. FINDINGS: Chronic moderate to large gastric hiatal hernia. The left lung base is completely opacified and this is due to a combination of small to moderate left pleural effusion and left lower lobe consolidation with air bronchograms (series 7, image 79). Aside from mild atelectasis the left lung base was clear in November. There is a small layering pleural effusion on the right. Both lungs otherwise are clear aside from mild biapical scarring. Major airways are patent. There is a new pericardial  effusion which is small, but somewhat intermediate in density raising the possibility of complex fluid. Stable cardiac size. Calcified coronary  artery atherosclerosis, with no significant calcified aortic atherosclerosis in the chest. No thoracic lymphadenopathy. Right IJ approach dual lumen dialysis or free cyst type catheter. Sub cm left thyroid nodule, does not meet consensus criteria for ultrasound follow-up. Negative visualized noncontrast liver and spleen in the upper abdomen. No osseous abnormality identified in the chest. IMPRESSION: 1. Left lung base opacification due to a combination of left lower lobe consolidation and small to moderate layering left pleural effusion. Favor pneumonia. 2. Small but intermediate density pericardial effusion which is new since November. Consider pericarditis or other exudative pericardial process. 3. Trace right pleural effusion. Chronic large gastric hiatal hernia. Calcified coronary artery atherosclerosis. Electronically Signed   By: Genevie Ann M.D.   On: 11/20/2015 09:23   Dg Chest Port 1 View  10/31/2015  CLINICAL DATA:  Fever.  Urinary tract infection. EXAM: PORTABLE CHEST 1 VIEW COMPARISON:  09/17/2015 FINDINGS: There is a dual-lumen right jugular central line extending into the low SVC. There is a hiatal hernia. Mild linear scarring or atelectasis in the left base. No confluent consolidation. No large effusion. No pneumothorax. IMPRESSION: Hiatal hernia.  Linear scarring or atelectasis in the left base. Electronically Signed   By: Andreas Newport M.D.   On: 10/31/2015 05:28   Ir Nephrostomy Exchange Right  11/04/2015  CLINICAL DATA:  Obstructive right hydronephrosis, chronic indwelling right percutaneous nephrostomy catheter and double-J ureteral stent. Routine exchange. EXAM: RIGHT PERCUTANEOUS NEPHROSTOMY CATHETER EXCHANGE UNDER FLUOROSCOPY FLUOROSCOPY TIME:  0.9 minutes, 38  uGym2 DAP TECHNIQUE: The nephrostomy tube and surrounding skin were prepped with Betadine, draped in usual sterile fashion. A small amount of contrast was injected through the right nephrostomy catheter to opacify the renal  collecting system. The catheter was cut and exchanged over a 0.035" angiographic wire for a new 10-French DAWSON MUELLER pigtail catheter, formed centrally within the collecting system under fluoroscopy. Contrast injection confirms appropriate positioning. The catheter was secured externally with a Statlock device. The patient tolerated the procedure well. Complications: None. IMPRESSION: 1. Technically successful exchange of right nephrostomy catheter under fluoroscopy Electronically Signed   By: Lucrezia Europe M.D.   On: 11/04/2015 15:11    Wt Readings from Last 3 Encounters:  11/20/15 177 lb 0.5 oz (80.3 kg)  11/11/15 171 lb 4.8 oz (77.701 kg)  10/29/15 175 lb (79.379 kg)    EKG:  - Initially NSR 97 nonspecific ST segment sagging inferiorly and V4-V6, unchanged from prior F/u EKG showed atrial fib.  Physical Exam: Blood pressure 106/60, pulse 96, temperature 97.8 F (36.6 C), temperature source Oral, resp. rate 18, height 5\' 3"  (1.6 m), weight 177 lb 0.5 oz (80.3 kg), last menstrual period 01/19/1992, SpO2 93 %. Body mass index is 31.37 kg/(m^2). General: Well developed, well nourished WF in no acute distress. Head: Normocephalic, atraumatic, sclera non-icteric, no xanthomas, nares are without discharge.  Neck: JVD not elevated. Lungs: Diminished BS left base, otherwise clear without wheezes, rales, or rhonchi. Breathing is unlabored. Heart: Irregularly irregular with S1 S2. No murmurs, rubs, or gallops appreciated. Abdomen: Soft, non-tender, non-distended with normoactive bowel sounds. No hepatomegaly. No rebound/guarding. No obvious abdominal masses. Msk:  Strength and tone appear normal for age. Extremities: No clubbing or cyanosis. No edema. Distal pedal pulses are 2+ and equal bilaterally.   Neuro: Alert and oriented X 3. No facial asymmetry. No focal deficit. Moves all extremities spontaneously. Psych:  Responds to questions appropriately with a normal affect.     Assessment and  Plan   1. Chest pain - symptoms atypical, h/o normal cors 2014. With persistent chest discomfort since 6pm and negative troponins, ACS felt less likely. Continue to cycle troponin. Question contribution of pleural effusion and pericardial effusion +/- pericarditis. She does report some inspirational chest discomfort. She is not uremic. Will defer decision of thoracentesis to IM. With d-dimer of 3.25 suspect inflammatory process with recent infection and effusions in two different sites - ?order CTA to exclude PE. Will start with 2D echo to further eval pericardial effusion.  2. Possible pericardial effusion - small by CT. She is not currently hypotensive or with distant heart sounds. Check 2D echo.  3. Paroxysmal atrial fibrillation - she has required amiodarone during prior admissions to restore sinus rhythm. With severely dilated LA I do not suspect that she will maintain sinus without chronic antiarrhythmic therapy. She is on anticoagulation. CHADSVASC = 4. HR presently in the 90s on BB. Per review with MD, start amiodarone 400mg  BID x 7 days then decrease to 400mg  daily. (She previously got several days of IV amio last admission end of May.) Recent TSH and LFTs ok. With prior normal cors and normal EF on recent echo could consider flecainide long term instead of amiodarone but would need updated ischemic eval which we would defer to outpatient setting given recent CP issues being sorted out.  4. H/o chronic diastolic CHF and LV dysfunction - recent EF normal. Appears euvolemic. Volume controlled by HD.  5. Chronic anemia - per IM.  Signed, Charlie Pitter PA-C 11/20/2015, 9:49 AM Pager: [phone]  I have personally seen and examined this patient with Melina Copa, PA-C. I agree with the assessment and plan as outlined above. She is known to have PAF and multiple prior UTI with sepsis. She has had prior cath with normal coronary arteries in 2014. Now admitted with ongoing, persistent left sided chest  pain x 18 hours. Troponin is negative. This does not appear to be ACS. There is a small pericardial effusion noted on CT scan. Her d-dimer is elevated but she has been on coumadin chronically. She also has a pleural effusion. She is also back in atrial fibrillation with controlled rate. She is now ESRD on HD. Exam with normally developed female, NAD, irreg irreg, no murmurs, basilar crackles bilaterally, no LE edema. Labs reviewed. EKG reviewed. Agree with echo to assess for pericardial effusion. Will load with po amiodarone given atrial fib. She is on coumadin. Her pain could be from pericarditis but also likely that it could be from her pleural effusion. We will follow up after her echo.   Lauree Chandler 11/20/2015 11:41 AM

## 2015-11-20 NOTE — Procedures (Signed)
Thoracentesis Procedure Note  Pre-operative Diagnosis: left pleural effusion   Post-operative Diagnosis: same  Indications: evaluation of pleural fluid  Procedure Details  Consent: Informed consent was obtained. Risks of the procedure were discussed including: infection, bleeding, pain, pneumothorax.  Under sterile conditions the patient was positioned. Betadine solution and sterile drapes were utilized.  1% buffered lidocaine was used to anesthetize the  rib space which was identified by real time [name]. Fluid was obtained without any difficulties and minimal blood loss.  A dressing was applied to the wound and wound care instructions were provided.   Findings 550 ml of cloudy pleural fluid was obtained. A sample was sent to Pathology for cytogenetics, flow, and cell counts, as well as for infection analysis.  Complications:  None; patient tolerated the procedure well.          Condition: unstable  Plan A follow up chest x-ray was ordered.  Erick Colace ACNP-BC [place] Pager # [phone] OR # [phone] if no answer

## 2015-11-21 ENCOUNTER — Other Ambulatory Visit (HOSPITAL_COMMUNITY): Payer: Medicare Other

## 2015-11-21 DIAGNOSIS — K449 Diaphragmatic hernia without obstruction or gangrene: Secondary | ICD-10-CM | POA: Diagnosis present

## 2015-11-21 DIAGNOSIS — I313 Pericardial effusion (noninflammatory): Secondary | ICD-10-CM | POA: Diagnosis present

## 2015-11-21 DIAGNOSIS — I132 Hypertensive heart and chronic kidney disease with heart failure and with stage 5 chronic kidney disease, or end stage renal disease: Secondary | ICD-10-CM | POA: Diagnosis not present

## 2015-11-21 DIAGNOSIS — Z9842 Cataract extraction status, left eye: Secondary | ICD-10-CM | POA: Diagnosis not present

## 2015-11-21 DIAGNOSIS — Z79899 Other long term (current) drug therapy: Secondary | ICD-10-CM | POA: Diagnosis not present

## 2015-11-21 DIAGNOSIS — Z936 Other artificial openings of urinary tract status: Secondary | ICD-10-CM | POA: Diagnosis not present

## 2015-11-21 DIAGNOSIS — Z9841 Cataract extraction status, right eye: Secondary | ICD-10-CM | POA: Diagnosis not present

## 2015-11-21 DIAGNOSIS — H409 Unspecified glaucoma: Secondary | ICD-10-CM | POA: Diagnosis present

## 2015-11-21 DIAGNOSIS — R079 Chest pain, unspecified: Secondary | ICD-10-CM | POA: Diagnosis not present

## 2015-11-21 DIAGNOSIS — R071 Chest pain on breathing: Secondary | ICD-10-CM | POA: Diagnosis not present

## 2015-11-21 DIAGNOSIS — Z9889 Other specified postprocedural states: Secondary | ICD-10-CM

## 2015-11-21 DIAGNOSIS — N185 Chronic kidney disease, stage 5: Secondary | ICD-10-CM | POA: Diagnosis not present

## 2015-11-21 DIAGNOSIS — R0902 Hypoxemia: Secondary | ICD-10-CM | POA: Diagnosis not present

## 2015-11-21 DIAGNOSIS — Z7901 Long term (current) use of anticoagulants: Secondary | ICD-10-CM | POA: Diagnosis not present

## 2015-11-21 DIAGNOSIS — I739 Peripheral vascular disease, unspecified: Secondary | ICD-10-CM | POA: Diagnosis present

## 2015-11-21 DIAGNOSIS — I5032 Chronic diastolic (congestive) heart failure: Secondary | ICD-10-CM | POA: Diagnosis present

## 2015-11-21 DIAGNOSIS — J90 Pleural effusion, not elsewhere classified: Principal | ICD-10-CM

## 2015-11-21 DIAGNOSIS — N186 End stage renal disease: Secondary | ICD-10-CM | POA: Diagnosis not present

## 2015-11-21 DIAGNOSIS — I480 Paroxysmal atrial fibrillation: Secondary | ICD-10-CM | POA: Diagnosis not present

## 2015-11-21 DIAGNOSIS — I2510 Atherosclerotic heart disease of native coronary artery without angina pectoris: Secondary | ICD-10-CM | POA: Diagnosis present

## 2015-11-21 DIAGNOSIS — Y95 Nosocomial condition: Secondary | ICD-10-CM | POA: Diagnosis present

## 2015-11-21 DIAGNOSIS — I252 Old myocardial infarction: Secondary | ICD-10-CM | POA: Diagnosis not present

## 2015-11-21 DIAGNOSIS — N19 Unspecified kidney failure: Secondary | ICD-10-CM | POA: Diagnosis not present

## 2015-11-21 DIAGNOSIS — N139 Obstructive and reflux uropathy, unspecified: Secondary | ICD-10-CM | POA: Diagnosis present

## 2015-11-21 DIAGNOSIS — E872 Acidosis: Secondary | ICD-10-CM | POA: Diagnosis not present

## 2015-11-21 DIAGNOSIS — I10 Essential (primary) hypertension: Secondary | ICD-10-CM | POA: Diagnosis not present

## 2015-11-21 DIAGNOSIS — Z885 Allergy status to narcotic agent status: Secondary | ICD-10-CM | POA: Diagnosis not present

## 2015-11-21 DIAGNOSIS — J948 Other specified pleural conditions: Secondary | ICD-10-CM | POA: Diagnosis not present

## 2015-11-21 DIAGNOSIS — J189 Pneumonia, unspecified organism: Secondary | ICD-10-CM | POA: Diagnosis present

## 2015-11-21 DIAGNOSIS — Z961 Presence of intraocular lens: Secondary | ICD-10-CM | POA: Diagnosis present

## 2015-11-21 DIAGNOSIS — I120 Hypertensive chronic kidney disease with stage 5 chronic kidney disease or end stage renal disease: Secondary | ICD-10-CM | POA: Diagnosis not present

## 2015-11-21 DIAGNOSIS — R091 Pleurisy: Secondary | ICD-10-CM | POA: Diagnosis not present

## 2015-11-21 DIAGNOSIS — D631 Anemia in chronic kidney disease: Secondary | ICD-10-CM | POA: Diagnosis not present

## 2015-11-21 DIAGNOSIS — R0781 Pleurodynia: Secondary | ICD-10-CM | POA: Diagnosis not present

## 2015-11-21 DIAGNOSIS — K219 Gastro-esophageal reflux disease without esophagitis: Secondary | ICD-10-CM | POA: Diagnosis present

## 2015-11-21 DIAGNOSIS — Z992 Dependence on renal dialysis: Secondary | ICD-10-CM | POA: Diagnosis not present

## 2015-11-21 DIAGNOSIS — Z90710 Acquired absence of both cervix and uterus: Secondary | ICD-10-CM | POA: Diagnosis not present

## 2015-11-21 DIAGNOSIS — E785 Hyperlipidemia, unspecified: Secondary | ICD-10-CM | POA: Diagnosis present

## 2015-11-21 DIAGNOSIS — Z9109 Other allergy status, other than to drugs and biological substances: Secondary | ICD-10-CM | POA: Diagnosis not present

## 2015-11-21 DIAGNOSIS — I319 Disease of pericardium, unspecified: Secondary | ICD-10-CM | POA: Diagnosis not present

## 2015-11-21 HISTORY — DX: Pneumonia, unspecified organism: J18.9

## 2015-11-21 LAB — CBC
HCT: 27.8 % — ABNORMAL LOW (ref 36.0–46.0)
Hemoglobin: 8.3 g/dL — ABNORMAL LOW (ref 12.0–15.0)
MCH: 26.3 pg (ref 26.0–34.0)
MCHC: 29.9 g/dL — ABNORMAL LOW (ref 30.0–36.0)
MCV: 88.3 fL (ref 78.0–100.0)
Platelets: 293 10*3/uL (ref 150–400)
RBC: 3.15 MIL/uL — ABNORMAL LOW (ref 3.87–5.11)
RDW: 16.8 % — ABNORMAL HIGH (ref 11.5–15.5)
WBC: 6 10*3/uL (ref 4.0–10.5)

## 2015-11-21 LAB — RENAL FUNCTION PANEL
Albumin: 2.3 g/dL — ABNORMAL LOW (ref 3.5–5.0)
Anion gap: 11 (ref 5–15)
BUN: 34 mg/dL — ABNORMAL HIGH (ref 6–20)
CO2: 26 mmol/L (ref 22–32)
Calcium: 8.7 mg/dL — ABNORMAL LOW (ref 8.9–10.3)
Chloride: 98 mmol/L — ABNORMAL LOW (ref 101–111)
Creatinine, Ser: 6.68 mg/dL — ABNORMAL HIGH (ref 0.44–1.00)
GFR calc Af Amer: 6 mL/min — ABNORMAL LOW (ref >60)
GFR calc non Af Amer: 6 mL/min — ABNORMAL LOW (ref >60)
Glucose, Bld: 89 mg/dL (ref 65–99)
Phosphorus: 5.3 mg/dL — ABNORMAL HIGH (ref 2.5–4.6)
Potassium: 4 mmol/L (ref 3.5–5.1)
Sodium: 135 mmol/L (ref 135–145)

## 2015-11-21 LAB — URINE CULTURE: Culture: 4000 — AB

## 2015-11-21 LAB — PROTIME-INR
INR: 2.45 — ABNORMAL HIGH (ref 0.00–1.49)
Prothrombin Time: 26.3 seconds — ABNORMAL HIGH (ref 11.6–15.2)

## 2015-11-21 MED ORDER — WARFARIN - PHARMACIST DOSING INPATIENT
Freq: Every day | Status: DC
  Administered 2015-11-21 – 2015-11-22 (×2)

## 2015-11-21 MED ORDER — FAMOTIDINE 20 MG PO TABS
20.0000 mg | ORAL_TABLET | Freq: Every day | ORAL | Status: DC
  Administered 2015-11-22 – 2015-11-23 (×2): 20 mg via ORAL
  Filled 2015-11-21 (×2): qty 1

## 2015-11-21 MED ORDER — VANCOMYCIN HCL IN DEXTROSE 750-5 MG/150ML-% IV SOLN
INTRAVENOUS | Status: AC
  Filled 2015-11-21: qty 150

## 2015-11-21 MED ORDER — WARFARIN SODIUM 5 MG PO TABS
5.0000 mg | ORAL_TABLET | Freq: Once | ORAL | Status: AC
  Administered 2015-11-21: 5 mg via ORAL
  Filled 2015-11-21: qty 1

## 2015-11-21 NOTE — Progress Notes (Signed)
PROGRESS NOTE  [name]  [id number] DOB: 1945-06-28  DOA: 11/20/2015 PCP: [name], MD   Brief Narrative:  70 year old female with a PMH of new ESRD on TTS HD (new HD started during recent hospitalization), nephrolithiasis, HTN, CAD, anemia of chronic disease, s/p chronic right percutaneous nephrostomy tube that was changed during recent admission, hospitalized 10/30/15-11/11/15 for sepsis from right PCN complicated pseudomonas UTI, new HD & PAF with RVR newly started on Coumadin, presented to [facility] ED on 11/19/15 with complaints of left-sided chest pressure and difficulty breathing which started after HD. Chest x-ray showed moderate left pleural effusion. Admitted for further evaluation. Cardiology and nephrology consulted.   Assessment & Plan:   Principal Problem:   Chest pain Active Problems:   Essential hypertension, benign   End stage renal disease (HCC)   Anemia of renal disease   PAF (paroxysmal atrial fibrillation) (HCC)   Pleural effusion, left   Pain in the chest   Pericardial effusion   Pleural effusion   Chest pain - Atypical per patient's report. Likely related to pleural effusion. Troponin 1 negative. - Cardiology input appreciated. History of normal coronaries 2014. ACS felt less likely. Continue to cycle troponins. Small pericardial effusion on CT chest. Clinically not uremic and is undergoing regular hemodialysis (last HD on 6/1). - D-dimer 3.25 probably nonspecific and of unclear significance. Patient has been consistently anticoagulated since 5/24.  - CT chest without contrast showed left lung base opacification due to combination of consolidation and small-to-moderate layering left pleural effusion, favoring pneumonia.  - Resolved after left therapeutic and diagnostic thoracentesis on 6/2.  Left lower lobe healthcare associated pneumonia - Seen on CT chest without contrast on 6/2. Started IV vancomycin and Zosyn per pharmacy.  Left pleural  effusion - DD: Volume overload from ESRD and HD, parapneumonic versus other etiology. - Started IV vancomycin and Zosyn per pharmacy on 6/2 - Pulmonology consultation appreciated. Status post left thoracentesis of 550 mL on 6/2. Predominantly lymphocytic. The area did follow outstanding culture and other results. Chest x-ray 5/6. Improved.   ESRD on HD TTS - Nephrology consulted. Last HD on 6/1. Not clinically overtly volume overloaded or uremic. Seen at HD on 6/3.  Recent sepsis secondary to Pseudomonas UTI complicating chronic right PCN - completed IV ceftazidime 2 week course (start date was 10/30/15). No urinary symptoms reported. Discuss with urology regarding need for suppressive antibiotics.  Chronic obstructive uropathy with chronic right PCN - Right PCN was changed by IR on 11/04/15. Outpatient follow-up with urology/Dr. Era Bumpers  Essential hypertension - Controlled  GERD/large hiatal hernia by CT chest - H2 blockers  Anemia of chronic kidney disease - No bleeding reported. Follow CBCs closely.  Paroxysmal A. fib with RVR - Continue metoprolol & Coumadin. Cardiology input appreciated. Started amiodarone. Reverted to sinus rhythm. Follow-up with Dr.Varanasi as outpatient.   DVT prophylaxis: Anticoagulated on Coumadin per pharmacy. Code Status:  Full Family Communication:  Discussed with patient. No family at bedside. Disposition Plan: DC home when medically stable, possibly early next week.   Consultants:   Nephrology  cardiology  Procedures:   Has chronic right PCA and which was changed by IR on 11/04/15.  Left thoracentesis by pulmonology on 6/2 yielding 550 mL of cloudy pleural fluid.  Antimicrobials:   IV vancomycin 6/2 >  IV Zosyn 6/2 >   Subjective: Feels much better. Chest pain and dyspnea resolved after left thoracentesis on 6/2. No recurrence of symptoms. Denies any other complaints. Seen at HD this morning.  Objective:  Filed Vitals:    11/21/15 1030 11/21/15 1100 11/21/15 1103 11/21/15 1148  BP: 105/56 108/51 111/57 109/43  Pulse: 71 73 72 75  Temp:   97.6 F (36.4 C) 98.2 F (36.8 C)  TempSrc:   Oral Oral  Resp:   23 18  Height:      Weight:   78.8 kg (173 lb 11.6 oz)   SpO2:   100% 97%    Intake/Output Summary (Last 24 hours) at 11/21/15 1156 Last data filed at 11/21/15 1103  Gross per 24 hour  Intake    480 ml  Output   1500 ml  Net  -1020 ml   Filed Weights   11/21/15 0453 11/21/15 0733 11/21/15 1103  Weight: 80.1 kg (176 lb 9.4 oz) 80.3 kg (177 lb 0.5 oz) 78.8 kg (173 lb 11.6 oz)    Examination:  General exam: Pleasant elderly female Lying comfortably supine in bed undergoing HD this morning. Respiratory system: : Improved breath sounds left base. Rest of lung fields clear to auscultation. Respiratory effort normal. Cardiovascular system: S1 & S2 heard, RRR. No JVD, murmurs, rubs, gallops or clicks. No pedal edema.. Telemetry: SR. Gastrointestinal system: Abdomen is nondistended, soft and nontender. No organomegaly or masses felt. Normal bowel sounds heard. Central nervous system: Alert and oriented. No focal neurological deficits. Extremities: Symmetric 5 x 5 power. Skin: No rashes, lesions or ulcers Psychiatry: Judgement and insight appear normal. Mood & affect appropriate.     Data Reviewed: I have personally reviewed following labs and imaging studies  CBC:  Recent Labs Lab 11/19/15 2214 11/20/15 0538 11/21/15 0800  WBC 7.5 5.3 6.0  NEUTROABS  --  3.5  --   HGB 8.8* 8.6* 8.3*  HCT 28.9* 28.9* 27.8*  MCV 87.6 89.5 88.3  PLT 274 242 0000000   Basic Metabolic Panel:  Recent Labs Lab 11/19/15 2214 11/20/15 0538 11/21/15 0800  NA 132* 134* 135  K 3.6 3.6 4.0  CL 99* 98* 98*  CO2 25 26 26   GLUCOSE 123* 94 89  BUN 19 22* 34*  CREATININE 4.20* 5.02* 6.68*  CALCIUM 7.9* 8.5* 8.7*  PHOS  --   --  5.3*   GFR: Estimated Creatinine Clearance: 7.7 mL/min (by C-G formula based on Cr of  6.68). Liver Function Tests:  Recent Labs Lab 11/20/15 0538 11/20/15 1302 11/21/15 0800  AST 11*  --   --   ALT 14  --   --   ALKPHOS 63  --   --   BILITOT 0.4  --   --   PROT 5.7* 5.8*  --   ALBUMIN 2.3*  --  2.3*   No results for input(s): LIPASE, AMYLASE in the last 168 hours. No results for input(s): AMMONIA in the last 168 hours. Coagulation Profile:  Recent Labs Lab 11/20/15 0538 11/21/15 0323  INR 2.62* 2.45*   Cardiac Enzymes:  Recent Labs Lab 11/20/15 0538 11/20/15 1135 11/20/15 1656  TROPONINI <0.03 <0.03 <0.03   BNP (last 3 results) No results for input(s): PROBNP in the last 8760 hours. HbA1C: No results for input(s): HGBA1C in the last 72 hours. CBG: No results for input(s): GLUCAP in the last 168 hours. Lipid Profile:  Recent Labs  11/20/15 1302  CHOL 146   Thyroid Function Tests: No results for input(s): TSH, T4TOTAL, FREET4, T3FREE, THYROIDAB in the last 72 hours. Anemia Panel: No results for input(s): VITAMINB12, FOLATE, FERRITIN, TIBC, IRON, RETICCTPCT in the last 72 hours.  Sepsis Labs:  Recent Labs Lab 11/20/15 1135  PROCALCITON 0.27    Recent Results (from the past 240 hour(s))  Urine culture     Status: Abnormal   Collection Time: 11/20/15  5:16 AM  Result Value Ref Range Status   Specimen Description URINE, CATHETERIZED  Final   Special Requests NONE  Final   Culture 4,000 COLONIES/mL INSIGNIFICANT GROWTH (A)  Final   Report Status 11/21/2015 FINAL  Final  Gram stain     Status: None   Collection Time: 11/20/15 12:58 PM  Result Value Ref Range Status   Specimen Description FLUID LEFT PLEURAL  Final   Special Requests NONE  Final   Gram Stain NO WBC SEEN NO ORGANISMS SEEN   Final   Report Status 11/20/2015 FINAL  Final  Culture, body fluid-bottle     Status: None (Preliminary result)   Collection Time: 11/20/15 12:58 PM  Result Value Ref Range Status   Specimen Description FLUID LEFT PLEURAL  Final   Special  Requests NONE  Final   Culture NO GROWTH < 24 HOURS  Final   Report Status PENDING  Incomplete         Radiology Studies: Dg Chest 2 View  11/19/2015  CLINICAL DATA:  Left-sided chest pain. EXAM: CHEST  2 VIEW COMPARISON:  10/31/2015 FINDINGS: Right-sided dialysis catheter tip in the SVC. Development of moderate size left pleural effusion from prior. There is a retrocardiac hiatal hernia. Small right pleural effusion. Cardiomediastinal contours are unchanged, partially obscured by pleural fluid. Mild chronic bronchitic changes without pulmonary edema. No pneumothorax. IMPRESSION: 1. Moderate left and trace right pleural effusions, new from prior exam. 2. Hiatal hernia. Electronically Signed   By: Jeb Levering M.D.   On: 11/19/2015 23:23   Ct Chest Wo Contrast  11/20/2015  CLINICAL DATA:  70 year old female with left side chest pain. Pleural effusions. End-stage renal disease. Initial encounter. EXAM: CT CHEST WITHOUT CONTRAST TECHNIQUE: Multidetector CT imaging of the chest was performed following the standard protocol without IV contrast. COMPARISON:  Chest radiographs 11/19/2015 and earlier. CT Abdomen and Pelvis 05/02/2015. FINDINGS: Chronic moderate to large gastric hiatal hernia. The left lung base is completely opacified and this is due to a combination of small to moderate left pleural effusion and left lower lobe consolidation with air bronchograms (series 7, image 79). Aside from mild atelectasis the left lung base was clear in November. There is a small layering pleural effusion on the right. Both lungs otherwise are clear aside from mild biapical scarring. Major airways are patent. There is a new pericardial effusion which is small, but somewhat intermediate in density raising the possibility of complex fluid. Stable cardiac size. Calcified coronary artery atherosclerosis, with no significant calcified aortic atherosclerosis in the chest. No thoracic lymphadenopathy. Right IJ approach dual  lumen dialysis or free cyst type catheter. Sub cm left thyroid nodule, does not meet consensus criteria for ultrasound follow-up. Negative visualized noncontrast liver and spleen in the upper abdomen. No osseous abnormality identified in the chest. IMPRESSION: 1. Left lung base opacification due to a combination of left lower lobe consolidation and small to moderate layering left pleural effusion. Favor pneumonia. 2. Small but intermediate density pericardial effusion which is new since November. Consider pericarditis or other exudative pericardial process. 3. Trace right pleural effusion. Chronic large gastric hiatal hernia. Calcified coronary artery atherosclerosis. Electronically Signed   By: Genevie Ann M.D.   On: 11/20/2015 09:23   Dg Chest Port 1 View  11/20/2015  CLINICAL DATA:  Status post thoracentesis EXAM: PORTABLE CHEST 1 VIEW COMPARISON:  11/19/2015 FINDINGS: Moderate cardiac enlargement. There is a right chest wall dialysis catheter with tips in the right atrium. Decrease in left pleural effusion status post thoracentesis. No pneumothorax identified. IMPRESSION: 1. No pneumothorax after left thoracentesis. Electronically Signed   By: Kerby Moors M.D.   On: 11/20/2015 13:11        Scheduled Meds: . amiodarone  400 mg Oral BID   Followed by  . [START ON 11/27/2015] amiodarone  400 mg Oral Daily  . famotidine  20 mg Oral BID  . latanoprost  1 drop Both Eyes QHS  . metoprolol tartrate  25 mg Oral BID  . piperacillin-tazobactam (ZOSYN)  IV  2.25 g Intravenous Q8H  . saccharomyces boulardii  250 mg Oral BID  . sodium chloride flush  3 mL Intravenous Q12H  . timolol  1 drop Both Eyes Daily  . vancomycin  750 mg Intravenous Q T,Th,Sa-HD   Continuous Infusions:       Time spent: 25 minutes.    [facility], MD [facility] Pager [phone]  If 7PM-7AM, please contact night-coverage [email or web address] Password [email or web address] 11/21/2015, 11:56 AM

## 2015-11-21 NOTE — Progress Notes (Signed)
[place] for coumadin Indication: atrial fibrillation and r/o ACS  Allergies  Allergen Reactions  . Vicodin [Hydrocodone-Acetaminophen] Nausea And Vomiting  . Chlorhexidine Rash    Sunburn    rash  . Percocet [Oxycodone-Acetaminophen] Nausea And Vomiting    Patient Measurements: Height: 5\' 3"  (160 cm) Weight: 173 lb 11.6 oz (78.8 kg) IBW/kg (Calculated) : 52.4  Vital Signs: Temp: 98.2 F (36.8 C) (06/03 1148) Temp Source: Oral (06/03 1148) BP: 109/43 mmHg (06/03 1148) Pulse Rate: 75 (06/03 1148)  Labs:  Recent Labs  11/19/15 2214 11/20/15 0538 11/20/15 1135 11/20/15 1656 11/21/15 0323 11/21/15 0800  HGB 8.8* 8.6*  --   --   --  8.3*  HCT 28.9* 28.9*  --   --   --  27.8*  PLT 274 242  --   --   --  293  LABPROT  --  27.6*  --   --  26.3*  --   INR  --  2.62*  --   --  2.45*  --   CREATININE 4.20* 5.02*  --   --   --  6.68*  TROPONINI  --  <0.03 <0.03 <0.03  --   --     Estimated Creatinine Clearance: 7.7 mL/min (by C-G formula based on Cr of 6.68).  Scheduled:  . amiodarone  400 mg Oral BID   Followed by  . [START ON 11/27/2015] amiodarone  400 mg Oral Daily  . famotidine  20 mg Oral BID  . latanoprost  1 drop Both Eyes QHS  . metoprolol tartrate  25 mg Oral BID  . piperacillin-tazobactam (ZOSYN)  IV  2.25 g Intravenous Q8H  . saccharomyces boulardii  250 mg Oral BID  . sodium chloride flush  3 mL Intravenous Q12H  . timolol  1 drop Both Eyes Daily  . vancomycin  750 mg Intravenous Q T,Th,Sa-HD    Assessment: 70yo female c/o worsening CP/pressure. She is on coumadin PTA for afib and this has been on hold for thoracentesis (done 6/2). Pharmacy consulted to resume coumadin  -INR= 2.45  Coumadin dose: 5mg /d (last taken 6/1)  Goal of Therapy:  INR= 2-3   Plan:  -Coumadin 5mg  po today -Daily PT/INR  Hildred Laser, Pharm D 11/21/2015 1:10 PM

## 2015-11-21 NOTE — Consult Note (Deleted)
PULMONARY / CRITICAL CARE MEDICINE   Name: [name] MRN: [id number] DOB: 1946-05-14    ADMISSION DATE:  11/20/2015 CONSULTATION DATE:  11/20/15  REFERRING MD:  Tera Partridge MD  CHIEF COMPLAINT:  Chest pain, pleural effusion with consolidation.    HISTORY OF PRESENT ILLNESS:   70 year old with past medical history of diastolic CHF, HTN, A. fib, end-stage renal disease on HD, status post nephrostomy placement for obstructive uropathy , recent admission from 5/12 - 11/11/15 for  for pseudomonas urosepsis. The hospital course was complicated by Afib with RVR.   Now readmitted with lt chest pain, pressure after HD associated with dyspnea.. CT scan shows Lt LL consolidation with associated effusion. PCCM called for evaluation.   SUBJECTIVE:  Feeling much better, no sob or cp or cough   VITAL SIGNS: BP 100/58 mmHg  Pulse 67  Temp(Src) 98.1 F (36.7 C) (Oral)  Resp 18  Ht 5\' 3"  (1.6 m)  Wt 177 lb 0.5 oz (80.3 kg)  BMI 31.37 kg/m2  SpO2 99%  LMP 01/19/1992 (Approximate)  FIO2 = 2lpm NP  HEMODYNAMICS:    VENTILATOR SETTINGS:    INTAKE / OUTPUT: I/O last 3 completed shifts: In: 24530 [P.O.:24530] Out: -   PHYSICAL EXAMINATION: General:  Awake, alert, No apparent distress lying back at 30 degrees s wob on HD  Neuro: Oriented. No focal deficits HEENT:  No thyromegaly, JVD Cardiovascular:  Irregular No MRG Lungs:  Slightly reduced BS at lt base with crackles  Abdomen:  Soft, Distended, + BS Musculoskeletal:  Normal tone and bulk Skin:  Intact  LABS:  BMET  Recent Labs Lab 11/19/15 2214 11/20/15 0538 11/21/15 0800  NA 132* 134* 135  K 3.6 3.6 4.0  CL 99* 98* 98*  CO2 25 26 26   BUN 19 22* 34*  CREATININE 4.20* 5.02* 6.68*  GLUCOSE 123* 94 89    Electrolytes  Recent Labs Lab 11/19/15 2214 11/20/15 0538 11/21/15 0800  CALCIUM 7.9* 8.5* 8.7*  PHOS  --   --  5.3*    CBC  Recent Labs Lab 11/19/15 2214 11/20/15 0538 11/21/15 0800  WBC 7.5 5.3 6.0   HGB 8.8* 8.6* 8.3*  HCT 28.9* 28.9* 27.8*  PLT 274 242 293    Coag's  Recent Labs Lab 11/20/15 0538 11/21/15 0323  INR 2.62* 2.45*    Sepsis Markers  Recent Labs Lab 11/20/15 1135  PROCALCITON 0.27    ABG No results for input(s): PHART, PCO2ART, PO2ART in the last 168 hours.  Liver Enzymes  Recent Labs Lab 11/20/15 0538 11/21/15 0800  AST 11*  --   ALT 14  --   ALKPHOS 63  --   BILITOT 0.4  --   ALBUMIN 2.3* 2.3*    Cardiac Enzymes  Recent Labs Lab 11/20/15 0538 11/20/15 1135 11/20/15 1656  TROPONINI <0.03 <0.03 <0.03    Glucose No results for input(s): GLUCAP in the last 168 hours.  Imaging Ct Chest Wo Contrast  11/20/2015  CLINICAL DATA:  70 year old female with left side chest pain. Pleural effusions. End-stage renal disease. Initial encounter. EXAM: CT CHEST WITHOUT CONTRAST TECHNIQUE: Multidetector CT imaging of the chest was performed following the standard protocol without IV contrast. COMPARISON:  Chest radiographs 11/19/2015 and earlier. CT Abdomen and Pelvis 05/02/2015. FINDINGS: Chronic moderate to large gastric hiatal hernia. The left lung base is completely opacified and this is due to a combination of small to moderate left pleural effusion and left lower lobe consolidation with air bronchograms (series 7,  image 79). Aside from mild atelectasis the left lung base was clear in November. There is a small layering pleural effusion on the right. Both lungs otherwise are clear aside from mild biapical scarring. Major airways are patent. There is a new pericardial effusion which is small, but somewhat intermediate in density raising the possibility of complex fluid. Stable cardiac size. Calcified coronary artery atherosclerosis, with no significant calcified aortic atherosclerosis in the chest. No thoracic lymphadenopathy. Right IJ approach dual lumen dialysis or free cyst type catheter. Sub cm left thyroid nodule, does not meet consensus criteria for  ultrasound follow-up. Negative visualized noncontrast liver and spleen in the upper abdomen. No osseous abnormality identified in the chest. IMPRESSION: 1. Left lung base opacification due to a combination of left lower lobe consolidation and small to moderate layering left pleural effusion. Favor pneumonia. 2. Small but intermediate density pericardial effusion which is new since November. Consider pericarditis or other exudative pericardial process. 3. Trace right pleural effusion. Chronic large gastric hiatal hernia. Calcified coronary artery atherosclerosis. Electronically Signed   By: Genevie Ann M.D.   On: 11/20/2015 09:23   Dg Chest Port 1 View  11/20/2015  CLINICAL DATA:  Status post thoracentesis EXAM: PORTABLE CHEST 1 VIEW COMPARISON:  11/19/2015 FINDINGS: Moderate cardiac enlargement. There is a right chest wall dialysis catheter with tips in the right atrium. Decrease in left pleural effusion status post thoracentesis. No pneumothorax identified. IMPRESSION: 1. No pneumothorax after left thoracentesis. Electronically Signed   By: Kerby Moors M.D.   On: 11/20/2015 13:11     STUDIES:  CT chest 11/20/15 >> left lower lobe consolidative changes with associated effusion. Images reviewed.  Micro studies/CULTURES: Procalcitonin    6/2 = 0.27  Ucx 6/2 > Pleural fluid 6/2   ANTIBIOTICS: Vanco 6/2 > Zosyn 6/2 >  SIGNIFICANT EVENTS:  L thoracentesis 6/2 x 550 cc  = borderline exudate/ L >P,  Wbc 610 with nl glucose      DISCUSSION: 70 year old with recent admission for urosepsis. Now readmitted with left lower lobe chest pain, dyspnea. CT scan findings concerning for left lower lobe HCAP with associated effusion.  Reccs: - Recommend continue treating with broad spectrum antibiotics for HCAP. - Check cultures when available - PCCM to check again on 6/5, call in meantime if needed     Christinia Gully, MD Pulmonary and [place] [phone] After 5:30  PM or weekends, call [phone]

## 2015-11-21 NOTE — Progress Notes (Signed)
PULMONARY / CRITICAL CARE MEDICINE   Name: Amy MELOTT MRN: [id number] DOB: February 20, 1946    ADMISSION DATE:  11/20/2015 CONSULTATION DATE:  11/20/15  REFERRING MD:  Tera Partridge MD  CHIEF COMPLAINT:  Chest pain, pleural effusion with consolidation.    HISTORY OF PRESENT ILLNESS:   70 year old with past medical history of diastolic CHF, HTN, A. fib, end-stage renal disease on HD, status post nephrostomy placement for obstructive uropathy , recent admission from 5/12 - 11/11/15 for  for pseudomonas urosepsis. The hospital course was complicated by Afib with RVR.   Now readmitted with lt chest pain, pressure after HD associated with dyspnea.. CT scan shows Lt LL consolidation with associated effusion. PCCM called for evaluation.   SUBJECTIVE:  Feeling much better, no sob or cp or cough   VITAL SIGNS: BP 106/54 mmHg  Pulse 72  Temp(Src) 98.1 F (36.7 C) (Oral)  Resp 18  Ht 5\' 3"  (1.6 m)  Wt 177 lb 0.5 oz (80.3 kg)  BMI 31.37 kg/m2  SpO2 99%  LMP 01/19/1992 (Approximate)  FIO2 = 2lpm NP  HEMODYNAMICS:    VENTILATOR SETTINGS:    INTAKE / OUTPUT: I/O last 3 completed shifts: In: 24530 [P.O.:24530] Out: -   PHYSICAL EXAMINATION: General:  Awake, alert, No apparent distress lying back at 30 degrees s wob on HD  Neuro: Oriented. No focal deficits HEENT:  No thyromegaly, JVD Cardiovascular:  Irregular No MRG Lungs:  Slightly reduced BS at lt base with crackles  Abdomen:  Soft, Distended, + BS Musculoskeletal:  Normal tone and bulk Skin:  Intact  LABS:  BMET  Recent Labs Lab 11/19/15 2214 11/20/15 0538 11/21/15 0800  NA 132* 134* 135  K 3.6 3.6 4.0  CL 99* 98* 98*  CO2 25 26 26   BUN 19 22* 34*  CREATININE 4.20* 5.02* 6.68*  GLUCOSE 123* 94 89    Electrolytes  Recent Labs Lab 11/19/15 2214 11/20/15 0538 11/21/15 0800  CALCIUM 7.9* 8.5* 8.7*  PHOS  --   --  5.3*    CBC  Recent Labs Lab 11/19/15 2214 11/20/15 0538 11/21/15 0800  WBC 7.5 5.3 6.0   HGB 8.8* 8.6* 8.3*  HCT 28.9* 28.9* 27.8*  PLT 274 242 293    Coag's  Recent Labs Lab 11/20/15 0538 11/21/15 0323  INR 2.62* 2.45*    Sepsis Markers  Recent Labs Lab 11/20/15 1135  PROCALCITON 0.27    ABG No results for input(s): PHART, PCO2ART, PO2ART in the last 168 hours.  Liver Enzymes  Recent Labs Lab 11/20/15 0538 11/21/15 0800  AST 11*  --   ALT 14  --   ALKPHOS 63  --   BILITOT 0.4  --   ALBUMIN 2.3* 2.3*    Cardiac Enzymes  Recent Labs Lab 11/20/15 0538 11/20/15 1135 11/20/15 1656  TROPONINI <0.03 <0.03 <0.03    Glucose No results for input(s): GLUCAP in the last 168 hours.  Imaging Dg Chest Port 1 View  11/20/2015  CLINICAL DATA:  Status post thoracentesis EXAM: PORTABLE CHEST 1 VIEW COMPARISON:  11/19/2015 FINDINGS: Moderate cardiac enlargement. There is a right chest wall dialysis catheter with tips in the right atrium. Decrease in left pleural effusion status post thoracentesis. No pneumothorax identified. IMPRESSION: 1. No pneumothorax after left thoracentesis. Electronically Signed   By: Kerby Moors M.D.   On: 11/20/2015 13:11     STUDIES:  CT chest 11/20/15 >> left lower lobe consolidative changes with associated effusion. Images reviewed.  Micro studies/CULTURES: Procalcitonin  6/2 = 0.27  Ucx 6/2 > Pleural fluid 6/2   ANTIBIOTICS: Vanco 6/2 > Zosyn 6/2 >  SIGNIFICANT EVENTS:  L thoracentesis 6/2 x 550 cc  = borderline exudate/ L >P,  Wbc 610 with nl glucose      DISCUSSION: 70 year old with recent admission for urosepsis. Now readmitted with left lower lobe chest pain, dyspnea. CT scan findings concerning for left lower lobe HCAP with associated effusion.  Reccs: - Recommend continue treating with broad spectrum antibiotics for HCAP. - Check cultures when available - PCCM to check again on 6/5, call in meantime if needed     Christinia Gully, MD Pulmonary and [place]  [phone] After 5:30 PM or weekends, call [phone]

## 2015-11-21 NOTE — Progress Notes (Signed)
PROGRESS NOTE  Subjective:   70 y/o F with history of HTN, ESRD now on HD T/Th/Sat, paroxysmal atrial fibrillation, HLD, h/o LV dysfunction, normal cors 123456, chronic diastolic CHF, recurrent sepsis s/p perc nephrostomy, chronic anemia whom we are asked to see for atrial fib.   She is examined up in HD.  She is in NSR   Objective:    Vital Signs:   Temp:  [97.6 F (36.4 C)-98.1 F (36.7 C)] 97.6 F (36.4 C) (06/03 1103) Pulse Rate:  [67-88] 72 (06/03 1103) Resp:  [18-23] 23 (06/03 1103) BP: (99-115)/(45-62) 108/51 mmHg (06/03 1100) SpO2:  [99 %-100 %] 100 % (06/03 1103) Weight:  [176 lb 9.4 oz (80.1 kg)-177 lb 0.5 oz (80.3 kg)] 177 lb 0.5 oz (80.3 kg) (06/03 0733)  Last BM Date: 11/20/15   24-hour weight change: Weight change: 1 lb 9.4 oz (0.721 kg)  Weight trends: Filed Weights   11/20/15 0426 11/21/15 0453 11/21/15 0733  Weight: 177 lb 0.5 oz (80.3 kg) 176 lb 9.4 oz (80.1 kg) 177 lb 0.5 oz (80.3 kg)    Intake/Output:  06/02 0701 - 06/03 0700 In: [id number] [P.O.:24530] Out: -  Total I/O In: -  Out: 1500 [Other:1500]   Physical Exam: BP 108/51 mmHg  Pulse 72  Temp(Src) 97.6 F (36.4 C) (Oral)  Resp 23  Ht 5\' 3"  (1.6 m)  Wt 177 lb 0.5 oz (80.3 kg)  BMI 31.37 kg/m2  SpO2 100%  LMP 01/19/1992 (Approximate)  Wt Readings from Last 3 Encounters:  11/21/15 177 lb 0.5 oz (80.3 kg)  11/11/15 171 lb 4.8 oz (77.701 kg)  10/29/15 175 lb (79.379 kg)    General: Vital signs reviewed and noted.   Head: Normocephalic, atraumatic.  Eyes: conjunctivae/corneas clear.  EOM's intact.   Throat: normal  Neck:  normal   Lungs:    clear   Heart:  RR   Abdomen:  Soft, non-tender, non-distended    Extremities: No edema    Neurologic: A&O X3, CN II - XII are grossly intact.   Psych: Normal     Labs: BMET:  Recent Labs  11/20/15 0538 11/21/15 0800  NA 134* 135  K 3.6 4.0  CL 98* 98*  CO2 26 26  GLUCOSE 94 89  BUN 22* 34*  CREATININE 5.02* 6.68*    CALCIUM 8.5* 8.7*  PHOS  --  5.3*    Liver function tests:  Recent Labs  11/20/15 0538 11/20/15 1302 11/21/15 0800  AST 11*  --   --   ALT 14  --   --   ALKPHOS 63  --   --   BILITOT 0.4  --   --   PROT 5.7* 5.8*  --   ALBUMIN 2.3*  --  2.3*   No results for input(s): LIPASE, AMYLASE in the last 72 hours.  CBC:  Recent Labs  11/20/15 0538 11/21/15 0800  WBC 5.3 6.0  NEUTROABS 3.5  --   HGB 8.6* 8.3*  HCT 28.9* 27.8*  MCV 89.5 88.3  PLT 242 293    Cardiac Enzymes:  Recent Labs  11/20/15 0538 11/20/15 1135 11/20/15 1656  TROPONINI <0.03 <0.03 <0.03    Coagulation Studies:  Recent Labs  11/20/15 0538 11/21/15 0323  LABPROT 27.6* 26.3*  INR 2.62* 2.45*    Other: Invalid input(s): POCBNP  Recent Labs  11/20/15 0538  DDIMER 3.25*   No results for input(s): HGBA1C in the last 72 hours.  Recent Labs  11/20/15 1302  CHOL 146   No results for input(s): TSH, T4TOTAL, T3FREE, THYROIDAB in the last 72 hours.  Invalid input(s): FREET3 No results for input(s): VITAMINB12, FOLATE, FERRITIN, TIBC, IRON, RETICCTPCT in the last 72 hours.   Other results:  tele  ( personally reviewed )  -NSR   Medications:    Infusions:    Scheduled Medications: . amiodarone  400 mg Oral BID   Followed by  . [START ON 11/27/2015] amiodarone  400 mg Oral Daily  . famotidine  20 mg Oral BID  . latanoprost  1 drop Both Eyes QHS  . metoprolol tartrate  25 mg Oral BID  . piperacillin-tazobactam (ZOSYN)  IV  2.25 g Intravenous Q8H  . saccharomyces boulardii  250 mg Oral BID  . sodium chloride flush  3 mL Intravenous Q12H  . timolol  1 drop Both Eyes Daily  . vancomycin  750 mg Intravenous Q T,Th,Sa-HD    Assessment/ Plan:   Principal Problem:   Chest pain Active Problems:   Essential hypertension, benign   End stage renal disease (HCC)   Anemia of renal disease   PAF (paroxysmal atrial fibrillation) (HCC)   Pleural effusion, left   Pain in the chest    Pericardial effusion   Pleural effusion  1.  Paroxysmal Atrial fib:   She is back on amio and is in NSR. I agree with the plan to cntinue amio for now. Follow up with Dr. Irish Lack in the office.   2. HTN  :  BP is well controlled,.    Disposition:  Length of Stay:   Ramond Dial., MD, [facility] 11/21/2015, 11:10 AM Office [phone] Pager [phone]

## 2015-11-22 ENCOUNTER — Inpatient Hospital Stay (HOSPITAL_COMMUNITY): Payer: Medicare Other

## 2015-11-22 DIAGNOSIS — J189 Pneumonia, unspecified organism: Secondary | ICD-10-CM

## 2015-11-22 DIAGNOSIS — I10 Essential (primary) hypertension: Secondary | ICD-10-CM

## 2015-11-22 DIAGNOSIS — R0781 Pleurodynia: Secondary | ICD-10-CM

## 2015-11-22 DIAGNOSIS — I319 Disease of pericardium, unspecified: Secondary | ICD-10-CM

## 2015-11-22 LAB — PROTIME-INR
INR: 2.36 — ABNORMAL HIGH (ref 0.00–1.49)
Prothrombin Time: 25.5 seconds — ABNORMAL HIGH (ref 11.6–15.2)

## 2015-11-22 LAB — ECHOCARDIOGRAM LIMITED
Height: 63 in
Weight: 2804.8 oz

## 2015-11-22 LAB — PROCALCITONIN: Procalcitonin: 0.16 ng/mL

## 2015-11-22 MED ORDER — WARFARIN SODIUM 5 MG PO TABS
5.0000 mg | ORAL_TABLET | Freq: Once | ORAL | Status: AC
  Administered 2015-11-22: 5 mg via ORAL
  Filled 2015-11-22: qty 1

## 2015-11-22 NOTE — Progress Notes (Signed)
[place] for coumadin Indication: atrial fibrillation and r/o ACS  Allergies  Allergen Reactions  . Vicodin [Hydrocodone-Acetaminophen] Nausea And Vomiting  . Chlorhexidine Rash    Sunburn    rash  . Percocet [Oxycodone-Acetaminophen] Nausea And Vomiting    Patient Measurements: Height: 5\' 3"  (160 cm) Weight: 175 lb 4.8 oz (79.516 kg) IBW/kg (Calculated) : 52.4  Vital Signs: Temp: 97.9 F (36.6 C) (06/04 0500) Temp Source: Oral (06/04 0500) BP: 101/52 mmHg (06/04 0500) Pulse Rate: 73 (06/04 0500)  Labs:  Recent Labs  11/19/15 2214 11/20/15 0538 11/20/15 1135 11/20/15 1656 11/21/15 0323 11/21/15 0800 11/22/15 0234  HGB 8.8* 8.6*  --   --   --  8.3*  --   HCT 28.9* 28.9*  --   --   --  27.8*  --   PLT 274 242  --   --   --  293  --   LABPROT  --  27.6*  --   --  26.3*  --  25.5*  INR  --  2.62*  --   --  2.45*  --  2.36*  CREATININE 4.20* 5.02*  --   --   --  6.68*  --   TROPONINI  --  <0.03 <0.03 <0.03  --   --   --     Estimated Creatinine Clearance: 7.7 mL/min (by C-G formula based on Cr of 6.68).  Scheduled:  . amiodarone  400 mg Oral BID   Followed by  . [START ON 11/27/2015] amiodarone  400 mg Oral Daily  . famotidine  20 mg Oral Daily  . latanoprost  1 drop Both Eyes QHS  . metoprolol tartrate  25 mg Oral BID  . piperacillin-tazobactam (ZOSYN)  IV  2.25 g Intravenous Q8H  . saccharomyces boulardii  250 mg Oral BID  . sodium chloride flush  3 mL Intravenous Q12H  . timolol  1 drop Both Eyes Daily  . vancomycin  750 mg Intravenous Q T,Th,Sa-HD  . Warfarin - Pharmacist Dosing Inpatient   Does not apply q1800    Assessment: 70yo female c/o worsening CP/pressure. She is on coumadin PTA for afib and this has been on hold for thoracentesis (done 6/2). Pharmacy consulted to resume coumadin. Amiodarone added 6/2. -INR= 2.36  Coumadin dose: 5mg /d (last taken 6/1)  Goal of Therapy:  INR= 2-3   Plan:  -Coumadin 5mg   po today -Daily PT/INR  Hildred Laser, Pharm D 11/22/2015 11:30 AM

## 2015-11-22 NOTE — Progress Notes (Signed)
PROGRESS NOTE  Subjective:   70 y/o F with history of HTN, ESRD now on HD T/Th/Sat, paroxysmal atrial fibrillation, HLD, h/o LV dysfunction, normal cors 123456, chronic diastolic CHF, recurrent sepsis s/p perc nephrostomy, chronic anemia whom we are asked to see for atrial fib.   She is examined up in HD.  She is in NSR   Objective:    Vital Signs:   Temp:  [97.4 F (36.3 C)-98.3 F (36.8 C)] 97.9 F (36.6 C) (06/04 0500) Pulse Rate:  [56-75] 73 (06/04 0500) Resp:  [14-23] 18 (06/04 0500) BP: (94-121)/(43-59) 101/52 mmHg (06/04 0500) SpO2:  [97 %-100 %] 98 % (06/04 0500) Weight:  [173 lb 11.6 oz (78.8 kg)-175 lb 4.8 oz (79.516 kg)] 175 lb 4.8 oz (79.516 kg) (06/04 0500)  Last BM Date: 11/21/15   24-hour weight change: Weight change: 7.1 oz (0.2 kg)  Weight trends: Filed Weights   11/21/15 0733 11/21/15 1103 11/22/15 0500  Weight: 177 lb 0.5 oz (80.3 kg) 173 lb 11.6 oz (78.8 kg) 175 lb 4.8 oz (79.516 kg)    Intake/Output:  06/03 0701 - 06/04 0700 In: 240 [P.O.:240] Out: 1800 [Urine:300]     Physical Exam: BP 101/52 mmHg  Pulse 73  Temp(Src) 97.9 F (36.6 C) (Oral)  Resp 18  Ht 5\' 3"  (1.6 m)  Wt 175 lb 4.8 oz (79.516 kg)  BMI 31.06 kg/m2  SpO2 98%  LMP 01/19/1992 (Approximate)  Wt Readings from Last 3 Encounters:  11/22/15 175 lb 4.8 oz (79.516 kg)  11/11/15 171 lb 4.8 oz (77.701 kg)  10/29/15 175 lb (79.379 kg)    General: Vital signs reviewed and noted.   Head: Normocephalic, atraumatic.  Eyes: conjunctivae/corneas clear.  EOM's intact.   Throat: normal  Neck:  normal   Lungs:    clear   Heart:  RR   Abdomen:  Soft, non-tender, non-distended    Extremities: No edema    Neurologic: A&O X3, CN II - XII are grossly intact.   Psych: Normal     Labs: BMET:  Recent Labs  11/20/15 0538 11/21/15 0800  NA 134* 135  K 3.6 4.0  CL 98* 98*  CO2 26 26  GLUCOSE 94 89  BUN 22* 34*  CREATININE 5.02* 6.68*  CALCIUM 8.5* 8.7*  PHOS  --   5.3*    Liver function tests:  Recent Labs  11/20/15 0538 11/20/15 1302 11/21/15 0800  AST 11*  --   --   ALT 14  --   --   ALKPHOS 63  --   --   BILITOT 0.4  --   --   PROT 5.7* 5.8*  --   ALBUMIN 2.3*  --  2.3*   No results for input(s): LIPASE, AMYLASE in the last 72 hours.  CBC:  Recent Labs  11/20/15 0538 11/21/15 0800  WBC 5.3 6.0  NEUTROABS 3.5  --   HGB 8.6* 8.3*  HCT 28.9* 27.8*  MCV 89.5 88.3  PLT 242 293    Cardiac Enzymes:  Recent Labs  11/20/15 0538 11/20/15 1135 11/20/15 1656  TROPONINI <0.03 <0.03 <0.03    Coagulation Studies:  Recent Labs  11/20/15 0538 11/21/15 0323 11/22/15 0234  LABPROT 27.6* 26.3* 25.5*  INR 2.62* 2.45* 2.36*    Other: Invalid input(s): POCBNP  Recent Labs  11/20/15 0538  DDIMER 3.25*   No results for input(s): HGBA1C in the last 72 hours.  Recent Labs  11/20/15 1302  CHOL 146  No results for input(s): TSH, T4TOTAL, T3FREE, THYROIDAB in the last 72 hours.  Invalid input(s): FREET3 No results for input(s): VITAMINB12, FOLATE, FERRITIN, TIBC, IRON, RETICCTPCT in the last 72 hours.   Other results:  tele  ( personally reviewed )  -NSR   Medications:    Infusions:    Scheduled Medications: . amiodarone  400 mg Oral BID   Followed by  . [START ON 11/27/2015] amiodarone  400 mg Oral Daily  . famotidine  20 mg Oral Daily  . latanoprost  1 drop Both Eyes QHS  . metoprolol tartrate  25 mg Oral BID  . piperacillin-tazobactam (ZOSYN)  IV  2.25 g Intravenous Q8H  . saccharomyces boulardii  250 mg Oral BID  . sodium chloride flush  3 mL Intravenous Q12H  . timolol  1 drop Both Eyes Daily  . vancomycin  750 mg Intravenous Q T,Th,Sa-HD  . Warfarin - Pharmacist Dosing Inpatient   Does not apply q1800    Assessment/ Plan:   Principal Problem:   Chest pain Active Problems:   Essential hypertension, benign   End stage renal disease (HCC)   Anemia of renal disease   PAF (paroxysmal atrial  fibrillation) (HCC)   Pleural effusion, left   Pain in the chest   Pericardial effusion   Pleural effusion   S/P thoracentesis   HCAP (healthcare-associated pneumonia)  1.  Paroxysmal Atrial fib:   She is back on amio and is in NSR. I agree with the plan to cntinue amio for now. Follow up with Dr. Irish Lack in the office.   2. HTN  :  BP is well controlled   No new recs. Will sign off . Call for further questions.   Disposition:  Length of Stay: 1  Thayer Headings, Brooke Bonito., MD, [facility] 11/22/2015, 10:50 AM Office [phone] Pager [phone]

## 2015-11-22 NOTE — Progress Notes (Signed)
PROGRESS NOTE  SERENTIY HOSBACH  [id number] DOB: 08-27-1945  DOA: 11/20/2015 PCP: [name], MD   Brief Narrative:  70 year old female with a PMH of new ESRD on TTS HD (new HD started during recent hospitalization), nephrolithiasis, HTN, CAD, anemia of chronic disease, s/p chronic right percutaneous nephrostomy tube that was changed during recent admission, hospitalized 10/30/15-11/11/15 for sepsis from right PCN complicated pseudomonas UTI, new HD & PAF with RVR newly started on Coumadin, presented to [facility] ED on 11/19/15 with complaints of left-sided chest pressure and difficulty breathing which started after HD. Chest x-ray showed moderate left pleural effusion. Admitted for further evaluation. Cardiology and nephrology consulted. Status post left thoracentesis 6/2. Clinically improved. Follow-up chest x-ray 6/5 and possible discharge home 6/5.   Assessment & Plan:   Principal Problem:   Chest pain Active Problems:   Essential hypertension, benign   End stage renal disease (HCC)   Anemia of renal disease   PAF (paroxysmal atrial fibrillation) (HCC)   Pleural effusion, left   Pain in the chest   Pericardial effusion   Pleural effusion   S/P thoracentesis   HCAP (healthcare-associated pneumonia)   Chest pain - Atypical per patient's report. Likely related to pleural effusion. Troponin 1 negative. - Cardiology input appreciated. History of normal coronaries 2014. ACS felt less likely. Continue to cycle troponins. Small pericardial effusion on CT chest. Clinically not uremic and is undergoing regular hemodialysis (last HD on 6/1). - D-dimer 3.25 probably nonspecific and of unclear significance. Patient has been consistently anticoagulated since 5/24.  - CT chest without contrast showed left lung base opacification due to combination of consolidation and small-to-moderate layering left pleural effusion, favoring pneumonia.  - Resolved after left therapeutic and diagnostic  thoracentesis on 6/2.  Left lower lobe healthcare associated pneumonia - Seen on CT chest without contrast on 6/2. Started IV vancomycin and Zosyn per pharmacy. Consider transitioning to oral antibiotics at discharge 6/5.  Left pleural effusion - DD: Volume overload from ESRD and HD, parapneumonic versus other etiology. - Started IV vancomycin and Zosyn per pharmacy on 6/2 - Pulmonology consultation appreciated. Status post left thoracentesis of 550 mL on 6/2. Predominantly lymphocytic. Follow outstanding culture and other results. Repeat Chest x-ray 6/5 and consider discharge home. - Pleural fluid culture: Negative to date.   ESRD on HD TTS - Nephrology consulted. Last HD on 6/1. Not clinically overtly volume overloaded or uremic. Had HD on 6/3.  Recent sepsis secondary to Pseudomonas UTI complicating chronic right PCN - completed IV ceftazidime 2 week course (start date was 10/30/15). No urinary symptoms reported. Discuss with urology regarding need for suppressive antibiotics.  Chronic obstructive uropathy with chronic right PCN - Right PCN was changed by IR on 11/04/15. Outpatient follow-up with urology/Dr. Era Bumpers  Essential hypertension - Controlled  GERD/large hiatal hernia by CT chest - H2 blockers  Anemia of chronic kidney disease - No bleeding reported. Follow CBCs closely.  Paroxysmal A. fib with RVR - Continue metoprolol & Coumadin. Cardiology input appreciated. Started amiodarone. Reverted to sinus rhythm. Follow-up with Dr.Varanasi as outpatient. Remains in sinus rhythm. INR therapeutic.   DVT prophylaxis: Anticoagulated on Coumadin per pharmacy. Code Status:  Full Family Communication:  Discussed with patient. No family at bedside. Disposition Plan: DC home possibly 6/5.   Consultants:   Nephrology  cardiology  Procedures:   Has chronic right PCA and which was changed by IR on 11/04/15.  Left thoracentesis by pulmonology on 6/2 yielding 550 mL of cloudy  pleural fluid.  Antimicrobials:   IV vancomycin 6/2 >  IV Zosyn 6/2 >   Subjective: Occasional left-sided chest pain only on deep inspiration. No dyspnea.  Objective:  Filed Vitals:   11/21/15 1148 11/21/15 1435 11/21/15 2224 11/22/15 0500  BP: 109/43 94/43 121/59 101/52  Pulse: 75 56 75 73  Temp: 98.2 F (36.8 C) 98.3 F (36.8 C) 97.4 F (36.3 C) 97.9 F (36.6 C)  TempSrc: Oral Oral Oral Oral  Resp: 18 14 14 18   Height:      Weight:    79.516 kg (175 lb 4.8 oz)  SpO2: 97%  100% 98%    Intake/Output Summary (Last 24 hours) at 11/22/15 1048 Last data filed at 11/22/15 0504  Gross per 24 hour  Intake    240 ml  Output   1800 ml  Net  -1560 ml   Filed Weights   11/21/15 0733 11/21/15 1103 11/22/15 0500  Weight: 80.3 kg (177 lb 0.5 oz) 78.8 kg (173 lb 11.6 oz) 79.516 kg (175 lb 4.8 oz)    Examination:  General exam: Pleasant elderly female ambulating comfortably in the room. Respiratory system: : Improved breath sounds left base. Rest of lung fields clear to auscultation. Respiratory effort normal. No pleural rub appreciated. Cardiovascular system: S1 & S2 heard, RRR. No JVD, murmurs, rubs, gallops or clicks. No pedal edema.. Telemetry: SR. Gastrointestinal system: Abdomen is nondistended, soft and nontender. No organomegaly or masses felt. Normal bowel sounds heard. Central nervous system: Alert and oriented. No focal neurological deficits. Extremities: Symmetric 5 x 5 power. Skin: No rashes, lesions or ulcers Psychiatry: Judgement and insight appear normal. Mood & affect appropriate.     Data Reviewed: I have personally reviewed following labs and imaging studies  CBC:  Recent Labs Lab 11/19/15 2214 11/20/15 0538 11/21/15 0800  WBC 7.5 5.3 6.0  NEUTROABS  --  3.5  --   HGB 8.8* 8.6* 8.3*  HCT 28.9* 28.9* 27.8*  MCV 87.6 89.5 88.3  PLT 274 242 0000000   Basic Metabolic Panel:  Recent Labs Lab 11/19/15 2214 11/20/15 0538 11/21/15 0800  NA 132*  134* 135  K 3.6 3.6 4.0  CL 99* 98* 98*  CO2 25 26 26   GLUCOSE 123* 94 89  BUN 19 22* 34*  CREATININE 4.20* 5.02* 6.68*  CALCIUM 7.9* 8.5* 8.7*  PHOS  --   --  5.3*   GFR: Estimated Creatinine Clearance: 7.7 mL/min (by C-G formula based on Cr of 6.68). Liver Function Tests:  Recent Labs Lab 11/20/15 0538 11/20/15 1302 11/21/15 0800  AST 11*  --   --   ALT 14  --   --   ALKPHOS 63  --   --   BILITOT 0.4  --   --   PROT 5.7* 5.8*  --   ALBUMIN 2.3*  --  2.3*   No results for input(s): LIPASE, AMYLASE in the last 168 hours. No results for input(s): AMMONIA in the last 168 hours. Coagulation Profile:  Recent Labs Lab 11/20/15 0538 11/21/15 0323 11/22/15 0234  INR 2.62* 2.45* 2.36*   Cardiac Enzymes:  Recent Labs Lab 11/20/15 0538 11/20/15 1135 11/20/15 1656  TROPONINI <0.03 <0.03 <0.03   BNP (last 3 results) No results for input(s): PROBNP in the last 8760 hours. HbA1C: No results for input(s): HGBA1C in the last 72 hours. CBG: No results for input(s): GLUCAP in the last 168 hours. Lipid Profile:  Recent Labs  11/20/15 1302  CHOL 146   Thyroid Function Tests:  No results for input(s): TSH, T4TOTAL, FREET4, T3FREE, THYROIDAB in the last 72 hours. Anemia Panel: No results for input(s): VITAMINB12, FOLATE, FERRITIN, TIBC, IRON, RETICCTPCT in the last 72 hours.  Sepsis Labs:  Recent Labs Lab 11/20/15 1135 11/22/15 0234  PROCALCITON 0.27 0.16    Recent Results (from the past 240 hour(s))  Urine culture     Status: Abnormal   Collection Time: 11/20/15  5:16 AM  Result Value Ref Range Status   Specimen Description URINE, CATHETERIZED  Final   Special Requests NONE  Final   Culture 4,000 COLONIES/mL INSIGNIFICANT GROWTH (A)  Final   Report Status 11/21/2015 FINAL  Final  Gram stain     Status: None   Collection Time: 11/20/15 12:58 PM  Result Value Ref Range Status   Specimen Description FLUID LEFT PLEURAL  Final   Special Requests NONE  Final     Gram Stain NO WBC SEEN NO ORGANISMS SEEN   Final   Report Status 11/20/2015 FINAL  Final  Culture, body fluid-bottle     Status: None (Preliminary result)   Collection Time: 11/20/15 12:58 PM  Result Value Ref Range Status   Specimen Description FLUID LEFT PLEURAL  Final   Special Requests NONE  Final   Culture NO GROWTH < 24 HOURS  Final   Report Status PENDING  Incomplete         Radiology Studies: Dg Chest Port 1 View  11/20/2015  CLINICAL DATA:  Status post thoracentesis EXAM: PORTABLE CHEST 1 VIEW COMPARISON:  11/19/2015 FINDINGS: Moderate cardiac enlargement. There is a right chest wall dialysis catheter with tips in the right atrium. Decrease in left pleural effusion status post thoracentesis. No pneumothorax identified. IMPRESSION: 1. No pneumothorax after left thoracentesis. Electronically Signed   By: Kerby Moors M.D.   On: 11/20/2015 13:11        Scheduled Meds: . amiodarone  400 mg Oral BID   Followed by  . [START ON 11/27/2015] amiodarone  400 mg Oral Daily  . famotidine  20 mg Oral Daily  . latanoprost  1 drop Both Eyes QHS  . metoprolol tartrate  25 mg Oral BID  . piperacillin-tazobactam (ZOSYN)  IV  2.25 g Intravenous Q8H  . saccharomyces boulardii  250 mg Oral BID  . sodium chloride flush  3 mL Intravenous Q12H  . timolol  1 drop Both Eyes Daily  . vancomycin  750 mg Intravenous Q T,Th,Sa-HD  . Warfarin - Pharmacist Dosing Inpatient   Does not apply q1800   Continuous Infusions:    LOS: 1 day    Time spent: 25 minutes.    [facility], MD [facility] Pager [phone]  If 7PM-7AM, please contact night-coverage [email or web address] Password [email or web address] 11/22/2015, 10:48 AM

## 2015-11-22 NOTE — Progress Notes (Signed)
  Echocardiogram 2D Echocardiogram has been performed.  [name] 11/22/2015, 2:48 PM

## 2015-11-23 ENCOUNTER — Inpatient Hospital Stay (HOSPITAL_COMMUNITY): Payer: Medicare Other

## 2015-11-23 DIAGNOSIS — J90 Pleural effusion, not elsewhere classified: Secondary | ICD-10-CM | POA: Insufficient documentation

## 2015-11-23 DIAGNOSIS — R091 Pleurisy: Secondary | ICD-10-CM | POA: Insufficient documentation

## 2015-11-23 DIAGNOSIS — R0902 Hypoxemia: Secondary | ICD-10-CM

## 2015-11-23 DIAGNOSIS — N186 End stage renal disease: Secondary | ICD-10-CM

## 2015-11-23 LAB — CBC
HCT: 30.1 % — ABNORMAL LOW (ref 36.0–46.0)
Hemoglobin: 9 g/dL — ABNORMAL LOW (ref 12.0–15.0)
MCH: 26.5 pg (ref 26.0–34.0)
MCHC: 29.9 g/dL — ABNORMAL LOW (ref 30.0–36.0)
MCV: 88.8 fL (ref 78.0–100.0)
Platelets: 347 10*3/uL (ref 150–400)
RBC: 3.39 MIL/uL — ABNORMAL LOW (ref 3.87–5.11)
RDW: 16.6 % — ABNORMAL HIGH (ref 11.5–15.5)
WBC: 7 10*3/uL (ref 4.0–10.5)

## 2015-11-23 LAB — PROTIME-INR
INR: 2.59 — ABNORMAL HIGH (ref 0.00–1.49)
Prothrombin Time: 27.4 seconds — ABNORMAL HIGH (ref 11.6–15.2)

## 2015-11-23 LAB — PH, BODY FLUID: pH, Body Fluid: 8.3

## 2015-11-23 MED ORDER — WARFARIN SODIUM 5 MG PO TABS: 5.0000 mg | ORAL_TABLET | Freq: Once | ORAL | Status: DC

## 2015-11-23 MED ORDER — AMIODARONE HCL 400 MG PO TABS: 400.0000 mg | ORAL_TABLET | Freq: Every day | ORAL | Status: DC

## 2015-11-23 MED ORDER — AMIODARONE HCL 400 MG PO TABS: 400.0000 mg | ORAL_TABLET | Freq: Two times a day (BID) | ORAL | Status: DC

## 2015-11-23 MED ORDER — LEVOFLOXACIN 500 MG PO TABS: 500.0000 mg | ORAL_TABLET | ORAL | Status: DC

## 2015-11-23 MED ORDER — LEVOFLOXACIN 750 MG PO TABS
750.0000 mg | ORAL_TABLET | Freq: Once | ORAL | Status: AC
  Administered 2015-11-23: 750 mg via ORAL
  Filled 2015-11-23: qty 1

## 2015-11-23 MED ORDER — WARFARIN SODIUM 5 MG PO TABS: 5.0000 mg | ORAL_TABLET | Freq: Every day | ORAL | Status: DC

## 2015-11-23 MED ORDER — LEVOFLOXACIN 500 MG PO TABS: 500.0000 mg | ORAL_TABLET | Freq: Once | ORAL | Status: DC

## 2015-11-23 NOTE — Care Management Important Message (Signed)
Important Message  Patient Details  Name: [name] MRN: [id number] Date of Birth: [birth year]   Medicare Important Message Given:  Yes    Dejia Ebron Abena 11/23/2015, 11:57 AM

## 2015-11-23 NOTE — Progress Notes (Signed)
[place] for coumadin Indication: atrial fibrillation and r/o ACS  Allergies  Allergen Reactions  . Vicodin [Hydrocodone-Acetaminophen] Nausea And Vomiting  . Chlorhexidine Rash    Sunburn    rash  . Percocet [Oxycodone-Acetaminophen] Nausea And Vomiting    Patient Measurements: Height: 5\' 3"  (160 cm) Weight: 176 lb (79.833 kg) IBW/kg (Calculated) : 52.4  Vital Signs: Temp: 98.2 F (36.8 C) (06/05 0446) Temp Source: Oral (06/05 0446) BP: 107/54 mmHg (06/05 0938) Pulse Rate: 79 (06/05 0938)  Labs:  Recent Labs  11/20/15 1135 11/20/15 1656 11/21/15 0323 11/21/15 0800 11/22/15 0234 11/23/15 0340  HGB  --   --   --  8.3*  --  9.0*  HCT  --   --   --  27.8*  --  30.1*  PLT  --   --   --  293  --  347  LABPROT  --   --  26.3*  --  25.5* 27.4*  INR  --   --  2.45*  --  2.36* 2.59*  CREATININE  --   --   --  6.68*  --   --   TROPONINI <0.03 <0.03  --   --   --   --     Estimated Creatinine Clearance: 7.7 mL/min (by C-G formula based on Cr of 6.68).  Scheduled:  . amiodarone  400 mg Oral BID   Followed by  . [START ON 11/27/2015] amiodarone  400 mg Oral Daily  . famotidine  20 mg Oral Daily  . latanoprost  1 drop Both Eyes QHS  . metoprolol tartrate  25 mg Oral BID  . piperacillin-tazobactam (ZOSYN)  IV  2.25 g Intravenous Q8H  . saccharomyces boulardii  250 mg Oral BID  . sodium chloride flush  3 mL Intravenous Q12H  . timolol  1 drop Both Eyes Daily  . vancomycin  750 mg Intravenous Q T,Th,Sa-HD  . Warfarin - Pharmacist Dosing Inpatient   Does not apply q1800    Assessment: 70yo female c/o worsening CP/pressure. She is on coumadin PTA for afib - previously on hold for thoracentesis (completed 6/2). Pharmacy consulted to resume coumadin on 6/3. Amiodarone added 6/2 - may affect INR.  INR 2.36>2.59. Hg 9 stable, plt wnl. No bleed documented.  Coumadin dose: 5mg /d (last taken 6/1)  Goal of Therapy:  INR= 2-3   Plan:   -Coumadin 5mg  x 1 dose tonight -Daily PT/INR, monitor s/sx bleeding   [name], PharmD, BCPS Clinical Pharmacist Pager [phone] 11/23/2015 11:17 AM

## 2015-11-23 NOTE — Progress Notes (Signed)
Patient's IV infiltrated and zosyn was unable to be completed. Called [facility] to see if the patient needed another IV or if she could have PO antibiotics. Per [facility], patient did not need any more zosyn and it was okay not to place another IV. Patient will be possible d/c.

## 2015-11-23 NOTE — Progress Notes (Signed)
Pharmacy Antibiotic Note  [name] is a 70 y.o. female admitted on 11/20/2015 with pneumonia.  Pharmacy has been consulted for vanc/zosyn dosing. Fortaz for pseudomonas UTI/sepsis (on PTA; appears plan was for 14 days total). [name] D/C'd on 6/2. Per MD note, to consider oral abx 6/5. Af, wbc wnl, PCT 0.27>0.16. ESRD on HD TTS   Plan: -Vanc 750mg  IV qHD - TTS -Zosyn 2.25g IV q8h -Follow up LOT, cultures, clinical progress - may change to PO abx 6/5 per MD note -Pre-HD VR as indicated  Height: 5\' 3"  (160 cm) Weight: 176 lb (79.833 kg) IBW/kg (Calculated) : 52.4  Temp (24hrs), Avg:98 F (36.7 C), Min:97.7 F (36.5 C), Max:98.2 F (36.8 C)   Recent Labs Lab 11/19/15 2214 11/20/15 0538 11/21/15 0800 11/23/15 0340  WBC 7.5 5.3 6.0 7.0  CREATININE 4.20* 5.02* 6.68*  --     Estimated Creatinine Clearance: 7.7 mL/min (by C-G formula based on Cr of 6.68).    Allergies  Allergen Reactions  . Vicodin [Hydrocodone-Acetaminophen] Nausea And Vomiting  . Chlorhexidine Rash    Sunburn    rash  . Percocet [Oxycodone-Acetaminophen] Nausea And Vomiting    Antimicrobials this admission: 6/2 vanc>> 6/2 zosyn>> Cefepime 5/13 >>5/18 Ceftazidime (started last admit) 5/18>>6/2  Dose adjustments this admission:   Microbiology results: 6/2 urine: neg 6/2 pleural fluid: ngtd 5/13 urine pseudomonas (pan-S)   [name], PharmD, [facility] Clinical Pharmacist Pager [phone] 11/23/2015 11:21 AM

## 2015-11-23 NOTE — Discharge Instructions (Signed)
Pleural Effusion °A pleural effusion is an abnormal buildup of fluid in the layers of tissue between your lungs and the inside of your chest (pleural space). These two layers of tissue that line both your lungs and the inside of your chest are called pleura. Usually, there is no air in the space between the pleura, only a thin layer of fluid. If left untreated, a large amount of fluid can build up and cause the lung to collapse. A pleural effusion is usually caused by another disease that requires treatment. °The two main types of pleural effusion are: °· Transudative pleural effusion. This happens when fluid leaks into the pleural space because of a low protein count in your blood or high blood pressure in your vessels. Heart failure often causes this. °· Exudative infusion. This occurs when fluid collects in the pleural space from blocked blood vessels or lymph vessels. Some lung diseases, injuries, and cancers can cause this type of effusion. °CAUSES °Pleural effusion can be caused by: °· Heart failure. °· A blood clot in the lung (pulmonary embolism). °· Pneumonia. °· Cancer. °· Liver failure (cirrhosis). °· Kidney disease. °· Complications from surgery, such as from open heart surgery. °SIGNS AND SYMPTOMS °In some cases, pleural effusion may cause no symptoms. Symptoms can include: °· Shortness of breath, especially when lying down. °· Chest pain, often worse when taking a deep breath. °· Fever. °· Dry cough that is lasting (chronic). °· Hiccups. °· Rapid breathing. °An underlying condition that is causing the pleural effusion (such as heart failure, pneumonia, blood clots, tuberculosis, or cancer) may also cause additional symptoms. °DIAGNOSIS °Your health care provider may suspect pleural effusion based on your symptoms and medical history. Your health care provider will also do a physical exam and a chest X-ray. If the X-ray shows there is fluid in your chest, you may need to have this fluid removed using a  needle (thoracentesis) so it can be tested. °You may also have: °· Imaging studies of the chest, such as: °¨ Ultrasound. °¨ CT scan. °· Blood tests for kidney and liver function. °TREATMENT °Treatment depends on the cause of the pleural effusion. Treatment may include: °· Taking antibiotic medicines to clear up an infection that is causing the pleural effusion. °· Placing a tube in the chest to drain the effusion (tube thoracostomy). This procedure is often used when there is an infection in the fluid. °· Surgery to remove the fibrous outer layer of tissue from the pleural space (decortication). °· Thoracentesis, which can improve cough and shortness of breath. °· A procedure to put medicine into the chest cavity to seal the pleural space to prevent fluid buildup (pleurodesis). °· Chemotherapy and radiation therapy. These may be required in the case of cancerous (malignant) pleural effusion. °HOME CARE INSTRUCTIONS °· Take medicines only as directed by your health care provider. °· Keep track of how long you can gently exercise before you get short of breath. Try simply walking at first. °· Do not use any tobacco products, including cigarettes, chewing tobacco, or electronic cigarettes. If you need help quitting, ask your health care provider. °· Keep all follow-up visits as directed by your health care provider. This is important. °SEEK MEDICAL CARE IF: °· The amount of time that you are able to exercise decreases or does not improve with time. °· You have pain or signs of infection at the puncture site if you had thoracentesis. Watch for: °¨ Drainage. °¨ Redness. °¨ Swelling. °· You have a fever. °  SEEK IMMEDIATE MEDICAL CARE IF: °· You are short of breath. °· You develop chest pain. °· You develop a new cough. °MAKE SURE YOU: °· Understand these instructions. °· Will watch your condition. °· Will get help right away if you are not doing well or get worse. °  °This information is not intended to replace advice  given to you by your health care provider. Make sure you discuss any questions you have with your health care provider. °  °Document Released: 06/06/2005 Document Revised: 06/27/2014 Document Reviewed: 10/30/2013 °Elsevier Interactive Patient Education ©2016 [employer] ° °

## 2015-11-23 NOTE — Progress Notes (Signed)
PULMONARY / CRITICAL CARE MEDICINE   Name: [name] MRN: [id number] DOB: 1946/06/19    ADMISSION DATE:  11/20/2015 CONSULTATION DATE:  11/20/15  REFERRING MD:  Tera Partridge MD  CHIEF COMPLAINT:  Chest pain, pleural effusion with consolidation.    HISTORY OF PRESENT ILLNESS:   70 year old with past medical history of diastolic CHF, HTN, A. fib, end-stage renal disease on HD, status post nephrostomy placement for obstructive uropathy , recent admission from 5/12 - 11/11/15 for  for pseudomonas urosepsis. The hospital course was complicated by Afib with RVR.   Now readmitted with lt chest pain, pressure after HD associated with dyspnea.. CT scan shows Lt LL consolidation with associated effusion. PCCM called for evaluation.   SUBJECTIVE:  Feeling better, ready to go home.  VITAL SIGNS: BP 96/49 mmHg  Pulse 65  Temp(Src) 98.3 F (36.8 C) (Oral)  Resp 16  Ht 5\' 3"  (1.6 m)  Wt 79.833 kg (176 lb)  BMI 31.18 kg/m2  SpO2 97%  LMP 01/19/1992 (Approximate)  FIO2 = 2lpm NP  HEMODYNAMICS:    VENTILATOR SETTINGS:    INTAKE / OUTPUT: I/O last 3 completed shifts: In: 840 [P.O.:840] Out: 300 [Urine:300]  PHYSICAL EXAMINATION: General:  Awake, alert, No apparent distress lying back at 30 degrees s wob on HD  Neuro: Oriented. No focal deficits HEENT:  No thyromegaly, JVD Cardiovascular:  Irregular No MRG Lungs:  clear Abdomen:  Soft, Distended, + BS Musculoskeletal:  Normal tone and bulk Skin:  Intact  LABS:  BMET  Recent Labs Lab 11/19/15 2214 11/20/15 0538 11/21/15 0800  NA 132* 134* 135  K 3.6 3.6 4.0  CL 99* 98* 98*  CO2 25 26 26   BUN 19 22* 34*  CREATININE 4.20* 5.02* 6.68*  GLUCOSE 123* 94 89    Electrolytes  Recent Labs Lab 11/19/15 2214 11/20/15 0538 11/21/15 0800  CALCIUM 7.9* 8.5* 8.7*  PHOS  --   --  5.3*    CBC  Recent Labs Lab 11/20/15 0538 11/21/15 0800 11/23/15 0340  WBC 5.3 6.0 7.0  HGB 8.6* 8.3* 9.0*  HCT 28.9* 27.8* 30.1*   PLT 242 293 347    Coag's  Recent Labs Lab 11/21/15 0323 11/22/15 0234 11/23/15 0340  INR 2.45* 2.36* 2.59*    Sepsis Markers  Recent Labs Lab 11/20/15 1135 11/22/15 0234  PROCALCITON 0.27 0.16    ABG No results for input(s): PHART, PCO2ART, PO2ART in the last 168 hours.  Liver Enzymes  Recent Labs Lab 11/20/15 0538 11/21/15 0800  AST 11*  --   ALT 14  --   ALKPHOS 63  --   BILITOT 0.4  --   ALBUMIN 2.3* 2.3*    Cardiac Enzymes  Recent Labs Lab 11/20/15 0538 11/20/15 1135 11/20/15 1656  TROPONINI <0.03 <0.03 <0.03    Glucose No results for input(s): GLUCAP in the last 168 hours.  Imaging Dg Chest 2 View  11/23/2015  CLINICAL DATA:  Generalized weakness, healthcare associated pneumonia, left pleural effusion, end-stage renal disease. EXAM: CHEST  2 VIEW COMPARISON:  Portable chest x-ray of November 20, 2015 FINDINGS: There is persistent increased density at the left lung base. The right lung base is clear. The cardiac silhouette remains enlarged. The pulmonary vascularity remains mildly engorged. The dual-lumen dialysis catheter tip projects over the midportion of the SVC. IMPRESSION: Persistent left basilar atelectasis or pneumonia with small left pleural effusion. Stable mild cardiomegaly without significant pulmonary vascular congestion. Electronically Signed   By: David  Martinique M.D.  On: 11/23/2015 07:43     STUDIES:  CT chest 11/20/15 >> left lower lobe consolidative changes with associated effusion. Images reviewed.  Micro studies/CULTURES: Procalcitonin    6/2 = 0.27  Ucx 6/2 > L pleural 6/2 > neg  ANTIBIOTICS: Vanco 6/2 > Zosyn 6/2 >  SIGNIFICANT EVENTS:  L thoracentesis 6/2 x 550 cc  = Transudate LHD 0.4 ratio, Prot 0.5 ratio  Wbc 610 with nl glucose     DISCUSSION: 70 year old with recent admission for urosepsis. Now readmitted with left lower lobe chest pain, dyspnea. CT scan findings concerning for left lower lobe HCAP with associated  effusion.   Reccs: - Recommend continue treating ABX. Narrow to Levaquin for total 7 days - Follow up with PCP, CXR if chest pain or dyspnea returns. - Clear for discharge from pulmonary standpoint  Georgann Housekeeper, AGACNP-BC [facility] Pulmonology/Critical Care Pager [phone] or [phone]  11/23/2015 1:58 PM  Attending Note:  70 year old female being treated with a LLL HCAP and associated effusion.  Patient was recently here with urosepsis.  Pain has subsided at this point.  I reviewed CXR myself, LLL infiltrate and effusion noted.  On exam, CP is improved and bibasilar crackles noted.  Discussed with PCCM-NP.  HCAP:  - D/C HCAP coverage.  - Levaquin for 5 more days.  - F/U on culture.  Left sided pleural effusion:  - CXR with primary.  - If recurs then will need another thora.  Chest pain: pleurisy due to PNA.  - NSAIDs.  - F/U with CXR if worse.  Hypoxemia:  - Titrate O2 for sat of 88-92%.  - D/C O2.  PCCM will sign off, please call back if needed.  Patient seen and examined, agree with above note.  I dictated the care and orders written for this patient under my direction.  Rush Farmer, MD [phone]

## 2015-11-23 NOTE — Progress Notes (Signed)
Pharmacy Antibiotic Note  [name] is a 70 y.o. female admitted on 11/20/2015 with pneumonia.  Pharmacy has been consulted for vanc/zosyn >> Levo PO dosing. Fortaz for pseudomonas UTI/sepsis (on PTA; appears plan was for 14 days total). [name] D/C'd on 6/2. Af, wbc wnl, PCT 0.27>0.16. ESRD on HD TTS   Plan: -D/c vanc/zosyn -Start levofloxacin 750mg  PO x1; then 500mg  PO q48h through 6/8  -Follow up cultures, clinical progress  Height: 5\' 3"  (160 cm) Weight: 176 lb (79.833 kg) IBW/kg (Calculated) : 52.4  Temp (24hrs), Avg:98.1 F (36.7 C), Min:97.7 F (36.5 C), Max:98.3 F (36.8 C)   Recent Labs Lab 11/19/15 2214 11/20/15 0538 11/21/15 0800 11/23/15 0340  WBC 7.5 5.3 6.0 7.0  CREATININE 4.20* 5.02* 6.68*  --     Estimated Creatinine Clearance: 7.7 mL/min (by C-G formula based on Cr of 6.68).    Allergies  Allergen Reactions  . Vicodin [Hydrocodone-Acetaminophen] Nausea And Vomiting  . Chlorhexidine Rash    Sunburn    rash  . Percocet [Oxycodone-Acetaminophen] Nausea And Vomiting    Antimicrobials this admission: 6/5 levo PO >> (6/8) 6/2 vanc>>6/5 6/2 zosyn>>6/5 Cefepime 5/13 >>5/18 Ceftazidime (started last admit) 5/18>>6/2  Dose adjustments this admission:   Microbiology results: 6/2 urine: neg 6/2 pleural fluid: ngtd 5/13 urine pseudomonas (pan-S)   [name], PharmD, BCPS Clinical Pharmacist Pager [phone] 11/23/2015 3:05 PM

## 2015-11-23 NOTE — Discharge Summary (Signed)
Physician Discharge Summary  [name]  [id number]  DOB: Jul 04, 1945  DOA: 11/20/2015  PCP: [name], MD  Admit date: 11/20/2015 Discharge date: 11/23/2015  Time spent: Greater than 30 minutes  Recommendations for Outpatient Follow-up:  1. Dr. Lajean Manes, PCP in 1 week. Please follow final pleural culture results that were sent from the hospital. 2. Recommend follow-up chest x-ray if chest pain or dyspnea returns or in 3-4 weeks to ensure resolution of pneumonia findings. [place], Cardiology in 1 week. 4. Hemodialysis Center: Advised to keep regular hemodialysis appointments on Tuesdays, Thursdays, Saturdays. Needs blood draw for lab work (PT, INR) across HD-next on 11/24/15 and these results are to be forwarded to Dr. Irish Lack for Coumadin dose adjustment. Also periodically draw CBC and renal panel as per nephrologist's discretion. 5. Interventional radiology: Patient is supposed to follow up with them for change of right percutaneous nephrostomy in 6 weeks from 11/04/15. 6. Dr. Carolan Clines, Urology: Patient states that she has a appointment in August but is advised to call for earlier follow-up. Decision to be made regarding need for suppressive antibiotics. (I have left a message with Dr. Era Bumpers to discuss)  Discharge Diagnoses:  Principal Problem:   Chest pain Active Problems:   Essential hypertension, benign   End stage renal disease (HCC)   Anemia of renal disease   PAF (paroxysmal atrial fibrillation) (HCC)   Pleural effusion, left   Pain in the chest   Pericardial effusion   Pleural effusion   S/P thoracentesis   HCAP (healthcare-associated pneumonia)   Pleural effusion on left   Discharge Condition: Improved & Stable  Diet recommendation: Heart healthy diet.  Filed Weights   11/21/15 1103 11/22/15 0500 11/23/15 0446  Weight: 78.8 kg (173 lb 11.6 oz) 79.516 kg (175 lb 4.8 oz) 79.833 kg (176 lb)    History of present illness:   70 year old female with a PMH of new ESRD on TTS HD (new HD started during recent hospitalization), nephrolithiasis, HTN, CAD, anemia of chronic disease, s/p chronic right percutaneous nephrostomy tube that was changed during recent admission, hospitalized 10/30/15-11/11/15 for sepsis from right PCN complicated pseudomonas UTI, new HD & PAF with RVR newly started on Coumadin, presented to [facility] ED on 11/19/15 with complaints of left-sided chest pressure and difficulty breathing which started after HD. Chest x-ray showed moderate left pleural effusion. Admitted for further evaluation. Cardiology and nephrology consulted. Status post left thoracentesis 6/2. Clinically improved.  Hospital Course:   Chest pain - Atypical per patient's report. Likely related to pleural effusion. Troponin 1 negative. - Cardiology input appreciated. History of normal coronaries 2014. ACS felt less likely. Small pericardial effusion on CT chest. Clinically not uremic and is undergoing regular hemodialysis (last HD on 6/1). - D-dimer 3.25 probably nonspecific and of unclear significance. Patient has been consistently anticoagulated since 5/24.  - CT chest without contrast showed left lung base opacification due to combination of consolidation and small-to-moderate layering left pleural effusion, favoring pneumonia.  - Chest pain resolved after left therapeutic and diagnostic thoracentesis on 6/2.  Left lower lobe healthcare associated pneumonia - Seen on CT chest without contrast on 6/2. Started IV vancomycin and Zosyn per pharmacy and has completed 3 days - clinically and radiologically improved. Seen by pulmonology today. Pleural effusion is transudative. Recommended discharging on oral levofloxacin to complete total 7 days treatment. - Recommend follow-up chest x-ray if chest pain or dyspnea returns or in 3-4 weeks to ensure resolution of pneumonia findings.  Left pleural  effusion - DD: Volume overload from ESRD and HD,  parapneumonic versus other etiology. - Started IV vancomycin and Zosyn per pharmacy on 6/2 and completed 3 days treatment. - Pulmonology consultation appreciated. Status post left thoracentesis of 550 mL on 6/2. Predominantly lymphocytic. - - clinically and radiologically improved. Seen by pulmonology today. Pleural effusion is transudative. Recommended discharging on oral levofloxacin to complete total 7 days treatment. - Recommend follow-up chest x-ray if chest pain or dyspnea returns or in 3-4 weeks to ensure resolution of pneumonia findings. - Pleural fluid cytopathology Diagnosis PLEURAL FLUID, LEFT (SPECIMEN 1OF 1 COLLECTED 11-20-2015) REACTIVE APPEARING MESOTHELIAL CELLS PRESENT.  ESRD on HD TTS - Nephrology consulted. Last HD on 6/1. Not clinically overtly volume overloaded or uremic. Had HD on 6/3. Discussed with Dr. Joelyn Oms, nephrology and advised him of patient's discharge today. She will continue outpatient regular dialysis on TTS schedule. Discussed with Dr. Joelyn Oms regarding lab draws for INR at next HD.  Recent sepsis secondary to Pseudomonas UTI complicating chronic right PCN - completed IV ceftazidime 2 week course (start date was 10/30/15). No urinary symptoms reported. Follow-up with urology regarding need for suppressive antibiotics.  Chronic obstructive uropathy with chronic right PCN - Right PCN was changed by IR on 11/04/15. Outpatient follow-up with urology/Dr. Era Bumpers. As per patient, she is supposed to follow up with IR for change of percutaneous nephrostomy in 6 weeks from previous change.  Essential hypertension - Controlled  GERD/large hiatal hernia by CT chest - H2 blockers  Anemia of chronic kidney disease - No bleeding reported. Stable.  Paroxysmal A. fib with RVR - Continue metoprolol & Coumadin. Cardiology input appreciated. Started amiodarone. Reverted to sinus rhythm. Follow-up with Dr.Varanasi as outpatient. Remains in sinus rhythm. INR therapeutic. -  As per cardiology, amiodarone 400 twice a day 1 week followed by 400 MG daily.   Consultants:   Nephrology  Cardiology  Procedures:   Has chronic right PCA and which was changed by IR on 11/04/15.  Left thoracentesis by pulmonology on 6/2 yielding 550 mL of cloudy pleural fluid.  2-D echocardiogram 11/22/15: Study Conclusions  - Left ventricle: The cavity size was normal. Systolic function was  normal. The estimated ejection fraction was in the range of 60%  to 65%. Wall motion was normal; there were no regional wall  motion abnormalities. The transmitral flow pattern was not  recorded. - Mitral valve: There was mild regurgitation. - Atrial septum: There was increased thickness of the septum,  consistent with lipomatous hypertrophy. - Pulmonary arteries: PA peak pressure: 34 mm Hg (S). - Pericardium, extracardiac: A possible, trivial, free-flowing  pericardial effusion was identified along the right ventricular  free wall.  Discharge Exam:  Complaints: No complaints. Denies dyspnea or chest pain.  Filed Vitals:   11/22/15 2133 11/23/15 0446 11/23/15 0938 11/23/15 1256  BP: 114/49 101/47 107/54 96/49  Pulse: 76 67 79 65  Temp: 97.7 F (36.5 C) 98.2 F (36.8 C)  98.3 F (36.8 C)  TempSrc: Oral Oral  Oral  Resp: 16 16  16   Height:      Weight:  79.833 kg (176 lb)    SpO2: 99% 97% 100% 97%    General exam: Pleasant elderly female ambulating comfortably in the room. Respiratory system: : Improved breath sounds left base/slightly diminished. Rest of lung fields clear to auscultation. Respiratory effort normal. No pleural rub appreciated. Cardiovascular system: S1 & S2 heard, RRR. No JVD, murmurs, rubs, gallops or clicks. No pedal edema.. Telemetry: SR. Gastrointestinal system:  Abdomen is nondistended, soft and nontender. No organomegaly or masses felt. Normal bowel sounds heard. Central nervous system: Alert and oriented. No focal neurological  deficits. Extremities: Symmetric 5 x 5 power. Skin: No rashes, lesions or ulcers Psychiatry: Judgement and insight appear normal. Mood & affect appropriate.   Discharge Instructions      Discharge Instructions    Call MD for:  difficulty breathing, headache or visual disturbances    Complete by:  As directed      Call MD for:  extreme fatigue    Complete by:  As directed      Call MD for:  persistant dizziness or light-headedness    Complete by:  As directed      Call MD for:  persistant nausea and vomiting    Complete by:  As directed      Call MD for:  redness, tenderness, or signs of infection (pain, swelling, redness, odor or green/yellow discharge around incision site)    Complete by:  As directed      Call MD for:  severe uncontrolled pain    Complete by:  As directed      Call MD for:  temperature >100.4    Complete by:  As directed      Diet - low sodium heart healthy    Complete by:  As directed      Increase activity slowly    Complete by:  As directed             Medication List    STOP taking these medications        cefTAZidime 2 g in dextrose 5 % 50 mL      TAKE these medications        acetaminophen 500 MG tablet  Commonly known as:  TYLENOL  Take 1,000 mg by mouth every 6 (six) hours as needed for moderate pain or headache.     amiodarone 400 MG tablet  Commonly known as:  PACERONE  Take 1 tablet (400 mg total) by mouth 2 (two) times daily. Stop after 11/26/2015 doses.     amiodarone 400 MG tablet  Commonly known as:  PACERONE  Take 1 tablet (400 mg total) by mouth daily. Start on 11/27/2015.  Start taking on:  11/27/2015     bimatoprost 0.01 % Soln  Commonly known as:  LUMIGAN  Place 1 drop into both eyes at bedtime.     feeding supplement (NEPRO CARB STEADY) Liqd  Take 237 mLs by mouth daily.     guaiFENesin-dextromethorphan 100-10 MG/5ML syrup  Commonly known as:  ROBITUSSIN DM  Take 5 mLs by mouth every 6 (six) hours as needed for cough.      levofloxacin 500 MG tablet  Commonly known as:  LEVAQUIN  Take 1 tablet (500 mg total) by mouth once. Last dose to be taken on 11/25/2015.  Start taking on:  11/25/2015     metoprolol tartrate 25 MG tablet  Commonly known as:  LOPRESSOR  Take 1 tablet (25 mg total) by mouth 2 (two) times daily.     ranitidine 150 MG tablet  Commonly known as:  ZANTAC  Take 150 mg by mouth 2 (two) times daily as needed for heartburn.     saccharomyces boulardii 250 MG capsule  Commonly known as:  FLORASTOR  Take 1 capsule (250 mg total) by mouth 2 (two) times daily.     sodium chloride 0.65 % Soln nasal spray  Commonly known as:  OCEAN  Place 1 spray into both nostrils 3 (three) times daily as needed for congestion.     timolol 0.5 % ophthalmic solution  Commonly known as:  BETIMOL  Place 1 drop into both eyes every morning.     warfarin 5 MG tablet  Commonly known as:  COUMADIN  Take 1 tablet (5 mg total) by mouth daily at 6 PM.       Follow-up Information    Follow up with [name], MD. Schedule an appointment as soon as possible for a visit in 1 week.   Specialty:  Internal Medicine   Contact information:   [address] [place] [address] [phone]       Follow up with Larae Grooms, MD. Schedule an appointment as soon as possible for a visit in 1 week.   Specialties:  Cardiology, Radiology, Interventional Cardiology   Contact information:   [id number] [address] [phone]       Follow up with Hemodialysis Center.   Why:  Keep regular dialysis appointments on Tues/Thur/Sat's. INR/Coumadin levels to be checked at Dialysis (next draw on 11/24/2015) and results to be sent to Dr. Irish Lack for dose adjustments.      Get Medicines reviewed and adjusted: Please take all your medications with you for your next visit with your Primary MD  Please request your Primary MD to go over all hospital tests and  procedure/radiological results at the follow up. Please ask your Primary MD to get all Hospital records sent to his/her office.  If you experience worsening of your admission symptoms, develop shortness of breath, life threatening emergency, suicidal or homicidal thoughts you must seek medical attention immediately by calling 911 or calling your MD immediately if symptoms less severe.  You must read complete instructions/literature along with all the possible adverse reactions/side effects for all the Medicines you take and that have been prescribed to you. Take any new Medicines after you have completely understood and accept all the possible adverse reactions/side effects.   Do not drive when taking pain medications.   Do not take more than prescribed Pain, Sleep and Anxiety Medications  Special Instructions: If you have smoked or chewed Tobacco in the last 2 yrs please stop smoking, stop any regular Alcohol and or any Recreational drug use.  Wear Seat belts while driving.  Please note  You were cared for by a hospitalist during your hospital stay. Once you are discharged, your primary care physician will handle any further medical issues. Please note that NO REFILLS for any discharge medications will be authorized once you are discharged, as it is imperative that you return to your primary care physician (or establish a relationship with a primary care physician if you do not have one) for your aftercare needs so that they can reassess your need for medications and monitor your lab values.    The results of significant diagnostics from this hospitalization (including imaging, microbiology, ancillary and laboratory) are listed below for reference.    Significant Diagnostic Studies: Dg Chest 2 View  11/23/2015  CLINICAL DATA:  Generalized weakness, healthcare associated pneumonia, left pleural effusion, end-stage renal disease. EXAM: CHEST  2 VIEW COMPARISON:  Portable chest x-ray of November 20, 2015 FINDINGS: There is persistent increased density at the left lung base. The right lung base is clear. The cardiac silhouette remains enlarged. The pulmonary vascularity remains mildly engorged. The dual-lumen dialysis catheter tip projects over the midportion of the SVC.  IMPRESSION: Persistent left basilar atelectasis or pneumonia with small left pleural effusion. Stable mild cardiomegaly without significant pulmonary vascular congestion. Electronically Signed   By: David  Martinique M.D.   On: 11/23/2015 07:43   Dg Chest 2 View  11/19/2015  CLINICAL DATA:  Left-sided chest pain. EXAM: CHEST  2 VIEW COMPARISON:  10/31/2015 FINDINGS: Right-sided dialysis catheter tip in the SVC. Development of moderate size left pleural effusion from prior. There is a retrocardiac hiatal hernia. Small right pleural effusion. Cardiomediastinal contours are unchanged, partially obscured by pleural fluid. Mild chronic bronchitic changes without pulmonary edema. No pneumothorax. IMPRESSION: 1. Moderate left and trace right pleural effusions, new from prior exam. 2. Hiatal hernia. Electronically Signed   By: Jeb Levering M.D.   On: 11/19/2015 23:23   Ct Chest Wo Contrast  11/20/2015  CLINICAL DATA:  70 year old female with left side chest pain. Pleural effusions. End-stage renal disease. Initial encounter. EXAM: CT CHEST WITHOUT CONTRAST TECHNIQUE: Multidetector CT imaging of the chest was performed following the standard protocol without IV contrast. COMPARISON:  Chest radiographs 11/19/2015 and earlier. CT Abdomen and Pelvis 05/02/2015. FINDINGS: Chronic moderate to large gastric hiatal hernia. The left lung base is completely opacified and this is due to a combination of small to moderate left pleural effusion and left lower lobe consolidation with air bronchograms (series 7, image 79). Aside from mild atelectasis the left lung base was clear in November. There is a small layering pleural effusion on the right. Both lungs  otherwise are clear aside from mild biapical scarring. Major airways are patent. There is a new pericardial effusion which is small, but somewhat intermediate in density raising the possibility of complex fluid. Stable cardiac size. Calcified coronary artery atherosclerosis, with no significant calcified aortic atherosclerosis in the chest. No thoracic lymphadenopathy. Right IJ approach dual lumen dialysis or free cyst type catheter. Sub cm left thyroid nodule, does not meet consensus criteria for ultrasound follow-up. Negative visualized noncontrast liver and spleen in the upper abdomen. No osseous abnormality identified in the chest. IMPRESSION: 1. Left lung base opacification due to a combination of left lower lobe consolidation and small to moderate layering left pleural effusion. Favor pneumonia. 2. Small but intermediate density pericardial effusion which is new since November. Consider pericarditis or other exudative pericardial process. 3. Trace right pleural effusion. Chronic large gastric hiatal hernia. Calcified coronary artery atherosclerosis. Electronically Signed   By: Genevie Ann M.D.   On: 11/20/2015 09:23   Dg Chest Port 1 View  11/20/2015  CLINICAL DATA:  Status post thoracentesis EXAM: PORTABLE CHEST 1 VIEW COMPARISON:  11/19/2015 FINDINGS: Moderate cardiac enlargement. There is a right chest wall dialysis catheter with tips in the right atrium. Decrease in left pleural effusion status post thoracentesis. No pneumothorax identified. IMPRESSION: 1. No pneumothorax after left thoracentesis. Electronically Signed   By: Kerby Moors M.D.   On: 11/20/2015 13:11   Dg Chest Port 1 View  10/31/2015  CLINICAL DATA:  Fever.  Urinary tract infection. EXAM: PORTABLE CHEST 1 VIEW COMPARISON:  09/17/2015 FINDINGS: There is a dual-lumen right jugular central line extending into the low SVC. There is a hiatal hernia. Mild linear scarring or atelectasis in the left base. No confluent consolidation. No large  effusion. No pneumothorax. IMPRESSION: Hiatal hernia.  Linear scarring or atelectasis in the left base. Electronically Signed   By: Andreas Newport M.D.   On: 10/31/2015 05:28   Ir Nephrostomy Exchange Right  11/04/2015  CLINICAL DATA:  Obstructive right hydronephrosis, chronic  indwelling right percutaneous nephrostomy catheter and double-J ureteral stent. Routine exchange. EXAM: RIGHT PERCUTANEOUS NEPHROSTOMY CATHETER EXCHANGE UNDER FLUOROSCOPY FLUOROSCOPY TIME:  0.9 minutes, 31  uGym2 DAP TECHNIQUE: The nephrostomy tube and surrounding skin were prepped with Betadine, draped in usual sterile fashion. A small amount of contrast was injected through the right nephrostomy catheter to opacify the renal collecting system. The catheter was cut and exchanged over a 0.035" angiographic wire for a new 10-French DAWSON MUELLER pigtail catheter, formed centrally within the collecting system under fluoroscopy. Contrast injection confirms appropriate positioning. The catheter was secured externally with a Statlock device. The patient tolerated the procedure well. Complications: None. IMPRESSION: 1. Technically successful exchange of right nephrostomy catheter under fluoroscopy Electronically Signed   By: Lucrezia Europe M.D.   On: 11/04/2015 15:11    Microbiology: Recent Results (from the past 240 hour(s))  Urine culture     Status: Abnormal   Collection Time: 11/20/15  5:16 AM  Result Value Ref Range Status   Specimen Description URINE, CATHETERIZED  Final   Special Requests NONE  Final   Culture 4,000 COLONIES/mL INSIGNIFICANT GROWTH (A)  Final   Report Status 11/21/2015 FINAL  Final  Gram stain     Status: None   Collection Time: 11/20/15 12:58 PM  Result Value Ref Range Status   Specimen Description FLUID LEFT PLEURAL  Final   Special Requests NONE  Final   Gram Stain NO WBC SEEN NO ORGANISMS SEEN   Final   Report Status 11/20/2015 FINAL  Final  Culture, body fluid-bottle     Status: None (Preliminary  result)   Collection Time: 11/20/15 12:58 PM  Result Value Ref Range Status   Specimen Description FLUID LEFT PLEURAL  Final   Special Requests NONE  Final   Culture NO GROWTH 3 DAYS  Final   Report Status PENDING  Incomplete     Labs: Basic Metabolic Panel:  Recent Labs Lab 11/19/15 2214 11/20/15 0538 11/21/15 0800  NA 132* 134* 135  K 3.6 3.6 4.0  CL 99* 98* 98*  CO2 25 26 26   GLUCOSE 123* 94 89  BUN 19 22* 34*  CREATININE 4.20* 5.02* 6.68*  CALCIUM 7.9* 8.5* 8.7*  PHOS  --   --  5.3*   Liver Function Tests:  Recent Labs Lab 11/20/15 0538 11/20/15 1302 11/21/15 0800  AST 11*  --   --   ALT 14  --   --   ALKPHOS 63  --   --   BILITOT 0.4  --   --   PROT 5.7* 5.8*  --   ALBUMIN 2.3*  --  2.3*   No results for input(s): LIPASE, AMYLASE in the last 168 hours. No results for input(s): AMMONIA in the last 168 hours. CBC:  Recent Labs Lab 11/19/15 2214 11/20/15 0538 11/21/15 0800 11/23/15 0340  WBC 7.5 5.3 6.0 7.0  NEUTROABS  --  3.5  --   --   HGB 8.8* 8.6* 8.3* 9.0*  HCT 28.9* 28.9* 27.8* 30.1*  MCV 87.6 89.5 88.3 88.8  PLT 274 242 293 347   Cardiac Enzymes:  Recent Labs Lab 11/20/15 0538 11/20/15 1135 11/20/15 1656  TROPONINI <0.03 <0.03 <0.03   BNP: BNP (last 3 results)  Recent Labs  02/26/15 0530 05/23/15 0602 07/04/15 1014  BNP 35.3 37.2 183.0*    ProBNP (last 3 results) No results for input(s): PROBNP in the last 8760 hours.  CBG: No results for input(s): GLUCAP in the last 168 hours.  Signed:  Vernell Leep, MD, FACP, FHM. [facility] Pager [phone]  If 7PM-7AM, please contact night-coverage [email or web address] Password [email or web address] 11/23/2015, 3:29 PM

## 2015-11-25 LAB — CULTURE, BODY FLUID-BOTTLE

## 2015-11-25 LAB — CHOLESTEROL, BODY FLUID: Cholesterol, Fluid: 74 mg/dL

## 2015-11-25 LAB — CULTURE, BODY FLUID W GRAM STAIN -BOTTLE: Culture: NO GROWTH

## 2015-11-26 LAB — ADENOSIDE DEAMINASE, PLEURAL FL: ADENOSIDE DEAMINASE, PLEURAL FL: <1.6 U/L (ref 0.0–9.4)

## 2015-11-26 NOTE — Progress Notes (Signed)
    Postoperative Access Visit   History of Present Illness  [name] is a 70 y.o. year old female who presents for postoperative follow-up for: L 2nd BVT (Date: 10/12/15).  The patient's wounds are healed.  The patient notes no steal symptoms.  The patient is able to complete their activities of daily living.  The patient's current symptoms are: none.  Pt was admitted with ESRD and pleural effusion.  She has started HD via R IJV TDC  For VQI Use Only  PRE-ADM LIVING: Home  AMB STATUS: Ambulatory  Physical Examination Filed Vitals:   11/27/15 1018  BP: 128/68  Pulse: 67  Temp: 97.2 F (36.2 C)  Resp: 18    LUE: Incision is healed, skin feels warm, hand grip is 5/5, sensation in digits is intact, palpable thrill, bruit can be auscultated   Medical Decision Making  [name] is a 70 y.o. year old female who presents s/p L 2nd BVT.   The patient's access is ready for use.  The patient's tunneled dialysis catheter can be removed after two successful cannulations and completed dialysis treatments.  Thank you for allowing [name] to participate in this patient's care.  Adele Barthel, MD, FACS [facility] Office: [phone] Pager: [phone]

## 2015-11-27 ENCOUNTER — Encounter: Payer: Self-pay | Admitting: Vascular Surgery

## 2015-11-27 ENCOUNTER — Ambulatory Visit (INDEPENDENT_AMBULATORY_CARE_PROVIDER_SITE_OTHER): Payer: Medicare Other | Admitting: Vascular Surgery

## 2015-11-27 VITALS — BP 128/68 | HR 67 | Temp 97.2°F | Resp 18 | Ht 63.0 in | Wt 173.0 lb

## 2015-11-27 DIAGNOSIS — N186 End stage renal disease: Secondary | ICD-10-CM

## 2015-11-30 ENCOUNTER — Encounter: Payer: Self-pay | Admitting: Physician Assistant

## 2015-11-30 ENCOUNTER — Ambulatory Visit (INDEPENDENT_AMBULATORY_CARE_PROVIDER_SITE_OTHER): Payer: Medicare Other

## 2015-11-30 ENCOUNTER — Ambulatory Visit (INDEPENDENT_AMBULATORY_CARE_PROVIDER_SITE_OTHER): Payer: Medicare Other | Admitting: Physician Assistant

## 2015-11-30 VITALS — BP 130/60 | HR 72 | Ht 63.0 in | Wt 174.0 lb

## 2015-11-30 DIAGNOSIS — J90 Pleural effusion, not elsewhere classified: Secondary | ICD-10-CM

## 2015-11-30 DIAGNOSIS — I480 Paroxysmal atrial fibrillation: Secondary | ICD-10-CM | POA: Diagnosis not present

## 2015-11-30 DIAGNOSIS — I10 Essential (primary) hypertension: Secondary | ICD-10-CM

## 2015-11-30 DIAGNOSIS — J948 Other specified pleural conditions: Secondary | ICD-10-CM | POA: Diagnosis not present

## 2015-11-30 DIAGNOSIS — Z5181 Encounter for therapeutic drug level monitoring: Secondary | ICD-10-CM | POA: Diagnosis not present

## 2015-11-30 DIAGNOSIS — N186 End stage renal disease: Secondary | ICD-10-CM

## 2015-11-30 DIAGNOSIS — I4891 Unspecified atrial fibrillation: Secondary | ICD-10-CM | POA: Diagnosis not present

## 2015-11-30 DIAGNOSIS — Z79899 Other long term (current) drug therapy: Secondary | ICD-10-CM

## 2015-11-30 LAB — POCT INR: INR: 3.6

## 2015-11-30 MED ORDER — AMIODARONE HCL 200 MG PO TABS: 200.0000 mg | ORAL_TABLET | Freq: Every day | ORAL | Status: DC

## 2015-11-30 NOTE — Progress Notes (Signed)
Cardiology Office Note:    Date:  11/30/2015   ID:  [name], DOB 19-Feb-1946, MRN [id number]  PCP:  [name], MD  Cardiologist:  Dr. Casandra Doffing   Electrophysiologist:  n/a  Referring MD: Lajean Manes, MD   Chief Complaint  Patient presents with  . Hospitalization Follow-up    AF in setting of pneumonia    History of Present Illness:     [name] is a 70 y.o. female with a hx of Dilated cardiomyopathy with EF 20-25% in the setting of sepsis in 0000000 complicated by demand ischemia and transient atrial fibrillation. She had an abnormal stress test that prompted cardiac catheterization which demonstrated normal coronary arteries in 2014. Other history includes HL, HTN, diastolic HF, ESRD on hemodialysis, recurrent sepsis status post percutaneous nephrostomy, chronic anemia. Last seen by Dr. Irish Lack 6/16.  She progressed to ESRD this year. She has had 7 admissions in the last 6 months most often for acute UTI/pyelo with sepsis. She was seen by cardiology during her last admission 5/12-5/24 for atrial fib with RVR. She was treated with amiodarone - initially did not convert but per Dr. Lysbeth Penner last note she had converted to NSR and amiodarone was stopped. Metoprolol was titrated.  2D Echo 11/03/15: mild LVH, EF 55-60%, grade 1 DD, severely dilated LA, mild TR, PASP 42.   Admitted 6/2-6/5 with chest pain.  CXR showed moderate L pleural effusion and trace R pleural effusion new from prior. CT without contrast showed left lung base consolidation with layering pleural effusion favored to represent pneumonia, with small but intermediate density pericardial effusion new since November (consider pericarditis), chronic large gastric hiatal hernia, calcified coronary atherosclerosis. Albumin was 2.3. Cardiac enzymes were negative. D-dimer was elevated at 3.25. Patient was treated with antibiotics and underwent left thoracentesis. She was followed by pulmonology. Pleural effusion  was transudative.  She developed AF with RVR in the hospital and was seen by cardiology.  Repeat echo demonstrated normal LV function and possible trivial pericardial effusion. She was loaded with amiodarone for an attempt at rhythm control.  She returned to NSR.   She returns for post hospitalization follow-up.  Here with her daughter.  She is feeling better since DC.  Denies significant dyspnea.  She denies recurrent chest discomfort.  Denies orthopnea, PND, edema.  Denies syncope. She goes to dialysis on Tues, Thursday, Sat.  Her coumadin is due to be checked tomorrow.   Past Medical History  Diagnosis Date  . GERD (gastroesophageal reflux disease)   . Glaucoma   . H/O hiatal hernia     a. CT 2017: large gastric hiatal hernia.  Marland Kitchen Headache(784.0)     migraine  . Pneumonia     dx 10-06-2014 per CXR--  on 10-27-2014 pt states finished antibiotic and denies cough or fever  . Hyperlipidemia   . PAF (paroxysmal atrial fibrillation) ([place])     a. 10/ 2013  in setting of Septic Shock //  b. recurrent during admit for pneumonia, L effusion >> placed on Amiodarone // Coumadin for anticoagulation  . Chronic diastolic CHF (congestive heart failure) ([place])     10/ 2013 in setting of septic shock  . Sigmoid diverticulosis   . Nephrolithiasis 2002, 2006    bilateral  . Hypertension     medication removed from regimen due to low blood pressure   . S/P hemodialysis catheter insertion ([place]) 04/11/2015     right anterior chest , only used once   . History  of nephrostomy 04/11/2015     currently inplace 04/28/2015   . History of blood transfusion 04/13/2015   . Iron deficiency     hx  . Glaucoma   . Complication of anesthesia     use a little anesthesia , per patient MD states she quit breathing (2016); hard to wake up  . ESRD (end stage renal disease) ([place])     dialysis Tues, Thurs, Sat  . Cardiomyopathy ([place])     a. h/o LV dysfunction EF 20-25% in 2013 due to sepsis.>> improved to normal   .  Normal coronary arteries 2014    a. LHC in 1/14: normal coronary arteries  . History of echocardiogram     a. Echo 6/17: EF 60-65%, normal wall motion, mild MR, atrial septal lipomatous hypertrophy, PASP 34 mmHg, possible trivial free-flowing pericardial effusion along RV free wall // b. Echo 5/17: Mild LVH, EF 55-60%, normal wall motion, grade 1 diastolic dysfunction, trivial MR, severe LAE, mild RAE, PASP 42 mmHg  . History of nuclear stress test     a. Myoview 1/14 - Marked ischemia in the basal anterior, mid anterior, apical septal and apical inferior regions, EF 63% >> LHC normal     Past Surgical History  Procedure Laterality Date  . Cystoscopy w/ ureteral stent placement  04/04/2012    Procedure: CYSTOSCOPY WITH RETROGRADE PYELOGRAM/URETERAL STENT PLACEMENT;  Surgeon: Ailene Rud, MD;  Location: [facility];  Service: Urology;  Laterality: Left;  . Total abdominal hysterectomy w/ bilateral salpingoophorectomy  1993    secondary to fibroids  . Breast biopsy Left 08/23/07    benign fibrocystic with duct ectasia  . Cardiac catheterization  07-11-2012  dr Irish Lack    Abnormal stress test/   normal coronary arteries/  LVEDP  3mmHg  . Cardiovascular stress test  06-26-2012  dr Irish Lack    marked ischemia in the basal anterior, mid anterior, apical inferior regions/  normal LVF, ef 63%  . Transthoracic echocardiogram  04-09-2012    normal LVF,  ef 60-65%,  mild LAE,  mild TR, trivial MR and PR  . Cataract extraction w/ intraocular lens  implant, bilateral    . Knee arthroscopy Left 02-14-2003  . Laparoscopic cholecystectomy  03-23-2005  . Extracorporeal shock wave lithotripsy  05-28-2012  &  10-08-2012  . Cystoscopy with stent placement Right 10/28/2014    Procedure: RIGHT URETERAL STENT PLACEMENT;  Surgeon: Irine Seal, MD;  Location: [facility];  Service: Urology;  Laterality: Right;  . Cystoscopy/retrograde/ureteroscopy/stone extraction with basket Right 11/21/2014     Procedure: CYSTOSCOPY/RIGHT RETROGRADE PYELOGRAM/RIGHT URETEROSCOPY/BASKET EXTRACTION/RIGHT PYELOSCOPY/LASER OF STONE/RIGHT DOUBLE J STENT;  Surgeon: Carolan Clines, MD;  Location: [facility];  Service: Urology;  Laterality: Right;  . Holmium laser application Right [id number]    Procedure: HOLMIUM LASER APPLICATION;  Surgeon: Carolan Clines, MD;  Location: [facility];  Service: Urology;  Laterality: Right;  . Cystoscopy with stent placement Right 02/26/2015    Procedure: CYSTOSCOPY RETROGRADE PYELOGRAM WITH STENT PLACEMENT;  Surgeon: Cleon Gustin, MD;  Location: [facility];  Service: Urology;  Laterality: Right;  . Cystoscopy w/ ureteral stent placement Bilateral 05/04/2015    Procedure: CYSTOSCOPY WITH BILATERAL RETROGRADE PYELOGRAM/ WITH INTERPRETATION, EXCHANGE OF RIGHT URETERAL STENT REPLACEMENT AND PLACEMENT LEFT URETERAL STENT PLACEMENT EXAMINATION OF VAGINA;  Surgeon: Carolan Clines, MD;  Location: [facility];  Service: Urology;  Laterality: Bilateral;  . Av fistula placement Left 06/02/2015    Procedure: BRACHIOCEPHALIC ARTERIOVENOUS (AV) FISTULA  CREATION ;  Surgeon: Conrad Evergreen, MD;  Location: [facility];  Service: Vascular;  Laterality: Left;  . Abdominal hysterectomy    . Bascilic vein transposition Left 07/27/2015    Procedure: FIRST STAGE BASILIC VEIN TRANSPOSITION LEFT UPPER ARM;  Surgeon: Conrad Palmdale, MD;  Location: [facility];  Service: Vascular;  Laterality: Left;  . Bascilic vein transposition Left 09/2015    second phase  . Bascilic vein transposition Left 10/12/2015    Procedure: SECOND STAGE BASILIC VEIN TRANSPOSITION LEFT ARM;  Surgeon: Conrad [place], MD;  Location[facility];  Service: Vascular;  Laterality: Left;    Current Medications: Outpatient Prescriptions Prior to Visit  Medication Sig Dispense Refill  . acetaminophen (TYLENOL) 500 MG tablet Take 1,000 mg by mouth every 6 (six) hours as needed for moderate pain or headache.     . bimatoprost  (LUMIGAN) 0.01 % SOLN Place 1 drop into both eyes at bedtime.    . metoprolol tartrate (LOPRESSOR) 25 MG tablet Take 1 tablet (25 mg total) by mouth 2 (two) times daily. 60 tablet 3  . Nutritional Supplements (FEEDING SUPPLEMENT, NEPRO CARB STEADY,) LIQD Take 237 mLs by mouth daily. (Patient taking differently: Take 237 mLs by mouth daily as needed (nutritional supplement). ) 30 Can 0  . ranitidine (ZANTAC) 150 MG tablet Take 150 mg by mouth 2 (two) times daily as needed for heartburn.    . saccharomyces boulardii (FLORASTOR) 250 MG capsule Take 1 capsule (250 mg total) by mouth 2 (two) times daily. 60 capsule 0  . sodium chloride (OCEAN) 0.65 % SOLN nasal spray Place 1 spray into both nostrils 3 (three) times daily as needed for congestion.    . timolol (BETIMOL) 0.5 % ophthalmic solution Place 1 drop into both eyes every morning.     . warfarin (COUMADIN) 5 MG tablet Take 1 tablet (5 mg total) by mouth daily at 6 PM. 30 tablet 0  . amiodarone (PACERONE) 400 MG tablet Take 1 tablet (400 mg total) by mouth daily. Start on 11/27/2015. 30 tablet 0  . guaiFENesin-dextromethorphan (ROBITUSSIN DM) 100-10 MG/5ML syrup Take 5 mLs by mouth every 6 (six) hours as needed for cough. (Patient not taking: Reported on 11/30/2015) 118 mL 0   No facility-administered medications prior to visit.      Allergies:   Vicodin; Chlorhexidine; and Percocet   Social History   Social History  . Marital Status: Widowed    Spouse Name: N/A  . Number of Children: 1  . Years of Education: N/A   Occupational History  . retired   . legal assistant    Social History Main Topics  . Smoking status: Never Smoker   . Smokeless tobacco: Never Used  . Alcohol Use: No  . Drug Use: No  . Sexual Activity: No     Comment: widow husband passed 5/05 with lung cancer   Other Topics Concern  . None   Social History Narrative     Family History:  The patient's family history includes Bladder Cancer in her maternal  grandfather; Cancer (age of onset: 53) in her father; Cancer (age of onset: 72) in her mother; Dementia in her mother; Diabetes in her brother; Heart disease in her brother; Heart failure in her paternal grandmother; Hypertension in her brother and mother.   ROS:   Please see the history of present illness.    Review of Systems  Cardiovascular: Positive for chest pain and irregular heartbeat.  Respiratory: Positive for cough.  Hematologic/Lymphatic: Bruises/bleeds easily.   All other systems reviewed and are negative.   Physical Exam:    VS:  BP 130/60 mmHg  Pulse 72  Ht 5\' 3"  (1.6 m)  Wt 174 lb (78.926 kg)  BMI 30.83 kg/m2  SpO2 97%  LMP 01/19/1992 (Approximate)   Physical Exam  Constitutional: She is oriented to person, place, and time. She appears well-developed and well-nourished.  HENT:  Head: Normocephalic and atraumatic.  Neck: Normal range of motion. No JVD present.  Cardiovascular: Normal rate, regular rhythm and normal heart sounds.   No murmur heard. Pulmonary/Chest: Effort normal and breath sounds normal. She has no wheezes. She has no rales.  Abdominal: Soft. There is no tenderness.  Musculoskeletal: Normal range of motion. She exhibits no edema.  Neurological: She is alert and oriented to person, place, and time.  Skin: Skin is warm and dry.  Psychiatric: She has a normal mood and affect.    Wt Readings from Last 3 Encounters:  11/30/15 174 lb (78.926 kg)  11/27/15 173 lb (78.472 kg)  11/23/15 176 lb (79.833 kg)      Studies/Labs Reviewed:     EKG:  EKG is  ordered today.  The ekg ordered today demonstrates NSR, HR 72, rightward axis, QTC 440 ms  Recent Labs: 05/25/2015: Magnesium 1.6* 07/04/2015: B Natriuretic Peptide 183.0* 11/05/2015: TSH 1.242 11/20/2015: ALT 14 11/21/2015: BUN 34*; Creatinine, Ser 6.68*; Potassium 4.0; Sodium 135 11/23/2015: Hemoglobin 9.0*; Platelets 347   Recent Lipid Panel    Component Value Date/Time   CHOL 146 11/20/2015 1302     TRIG 84 07/04/2015 1014   HDL 43 07/04/2015 1014   CHOLHDL 2.9 07/04/2015 1014   VLDL 17 07/04/2015 1014   LDLCALC 63 07/04/2015 1014    Additional studies/ records that were reviewed today include:   Echo 11/22/15  - Left ventricle: The cavity size was normal. Systolic function was   normal. The estimated ejection fraction was in the range of 60%   to 65%. Wall motion was normal; there were no regional wall   motion abnormalities. The transmitral flow pattern was not   recorded. - Mitral valve: There was mild regurgitation. - Atrial septum: There was increased thickness of the septum,   consistent with lipomatous hypertrophy. - Pulmonary arteries: PA peak pressure: 34 mm Hg (S). - Pericardium, extracardiac: A possible, trivial, free-flowing   pericardial effusion was identified along the right ventricular   free wall.  Echo 11/03/15  - Left ventricle: The cavity size was normal. Wall thickness was   increased in a pattern of mild LVH. There was mild concentric   hypertrophy. Systolic function was normal. The estimated ejection   fraction was in the range of 55% to 60%. Wall motion was normal;   there were no regional wall motion abnormalities. Doppler   parameters are consistent with abnormal left ventricular   relaxation (grade 1 diastolic dysfunction). Doppler parameters   are consistent with indeterminate ventricular filling pressure. - Aortic valve: Transvalvular velocity was within the normal range.   There was no stenosis. There was no regurgitation. - Mitral valve: There was trivial regurgitation. - Left atrium: The atrium was severely dilated. - Right ventricle: The cavity size was normal. Wall thickness was   normal. Systolic function was normal. - Right atrium: The atrium was mildly dilated. - Atrial septum: No defect or patent foramen ovale was identified   by color flow Doppler. - Tricuspid valve: Transvalvular velocity was within the normal   range.  There was  mild regurgitation. - Pulmonary arteries: Systolic pressure was mildly increased. PA   peak pressure: 42 mm Hg (S). - Inferior vena cava: The vessel was normal in size. The   respirophasic diameter changes were blunted (< 50%), consistent   with elevated central venous pressure.  [place] 1/14  ANGIOGRAPHIC DATA:    LM widely patent. LAD appears angiographically normal.   LCx widely patent.  There is a small first obtuse marginal which is also widely patent.   RCA angiographically normal. LVEDP was 13 mmHg.  Myoview 1/14 Marked ischemia in the basal anterior, mid anterior, apical septal and apical inferior regions, EF 63%   ASSESSMENT:     1. PAF (paroxysmal atrial fibrillation) ([place])   2. Essential hypertension, benign   3. End stage renal disease ([place])   4. Pleural effusion, left   5. On amiodarone therapy     PLAN:     In order of problems listed above:  1. PAF - Maintaining NSR on amiodarone.  Will continue.  Continue Coumadin for anticoagulation (CHADS2-VASc=3 - HTN, female, age > 56).  Continue Amiodarone 400 mg QD x 1 week, then reduce to 200 mg QD.  2. HTN - BP controlled on current regimen.  3. ESRD - She is on Tues, Thurs, Sat dialysis.  4. L Pleural Effusion - Recent admit with pneumonia and L effusion s/p thoracentesis.  Effusion was a transudate and she was treated with IV >> oral antibiotics.  She will FU with her PCP for repeat CXR.  5. Amiodarone Rx -  Arrange LFTs, TSH in 6-8 weeks.  Arrange PFTs with DLCO.  She knows to get annual eye exams.     Medication Adjustments/Labs and Tests Ordered: Current medicines are reviewed at length with the patient today.  Concerns regarding medicines are outlined above.  Medication changes, Labs and Tests ordered today are outlined in the Patient Instructions noted below. Patient Instructions  Medication Instructions:  1. TAKE AMIODARONE 400 MG FOR ONE WEEK AND THEN ... 2. THEN ON 12/07/2015 START TAKING AMIODARONE 200 MG  ONCE A DAY  If you need a refill on your cardiac medications before your next appointment, please call your pharmacy. Labwork:  RETURN IN 6 TO 8 WEEKS FOR LABS TSH AND LFT  ON SAME DAY AS DR Irish Lack  VISIT  Testing/Procedures:  IN ONE MONTH Your physician has recommended that you have a pulmonary function test. Pulmonary Function Tests are a group of tests that measure how well air moves in and out of your lungs. Follow-Up:  WITH DR Irish Lack  IN 2 TO 3 MONTHS  Any Other Special Instructions Will Be Listed Below (If Applicable).   Signed, Richardson Dopp, PA-C  11/30/2015 12:06 PM    [facility] [place], [place],   [address] Phone: [phone]; Fax: [phone]

## 2015-11-30 NOTE — Patient Instructions (Addendum)
Medication Instructions:  1. TAKE AMIODARONE 400 MG FOR ONE WEEK AND THEN ... 2. THEN ON 12/07/2015 START TAKING AMIODARONE 200 MG ONCE A DAY  If you need a refill on your cardiac medications before your next appointment, please call your pharmacy. Labwork:  RETURN IN 6 TO 8 WEEKS FOR LABS TSH AND LFT  ON SAME DAY AS DR Irish Lack  VISIT  Testing/Procedures:  IN ONE MONTH Your physician has recommended that you have a pulmonary function test. Pulmonary Function Tests are a group of tests that measure how well air moves in and out of your lungs. Follow-Up:  WITH DR Irish Lack  IN 2 TO 3 MONTHS  Any Other Special Instructions Will Be Listed Below (If Applicable).

## 2015-12-07 ENCOUNTER — Ambulatory Visit (INDEPENDENT_AMBULATORY_CARE_PROVIDER_SITE_OTHER): Payer: Medicare Other | Admitting: *Deleted

## 2015-12-07 DIAGNOSIS — I480 Paroxysmal atrial fibrillation: Secondary | ICD-10-CM

## 2015-12-07 DIAGNOSIS — Z5181 Encounter for therapeutic drug level monitoring: Secondary | ICD-10-CM

## 2015-12-07 DIAGNOSIS — I4891 Unspecified atrial fibrillation: Secondary | ICD-10-CM

## 2015-12-07 LAB — POCT INR: INR: 5.1

## 2015-12-07 NOTE — Patient Instructions (Signed)
Follow anticoagulation safety measures, reviewed with patient

## 2015-12-11 ENCOUNTER — Ambulatory Visit (INDEPENDENT_AMBULATORY_CARE_PROVIDER_SITE_OTHER): Payer: Medicare Other

## 2015-12-11 DIAGNOSIS — I480 Paroxysmal atrial fibrillation: Secondary | ICD-10-CM | POA: Diagnosis not present

## 2015-12-11 DIAGNOSIS — Z5181 Encounter for therapeutic drug level monitoring: Secondary | ICD-10-CM

## 2015-12-11 DIAGNOSIS — I4891 Unspecified atrial fibrillation: Secondary | ICD-10-CM | POA: Diagnosis not present

## 2015-12-11 LAB — POCT INR: INR: 3.3

## 2015-12-17 ENCOUNTER — Other Ambulatory Visit: Payer: Self-pay | Admitting: Radiology

## 2015-12-18 ENCOUNTER — Ambulatory Visit (INDEPENDENT_AMBULATORY_CARE_PROVIDER_SITE_OTHER): Payer: Medicare Other

## 2015-12-18 DIAGNOSIS — Z5181 Encounter for therapeutic drug level monitoring: Secondary | ICD-10-CM

## 2015-12-18 DIAGNOSIS — I129 Hypertensive chronic kidney disease with stage 1 through stage 4 chronic kidney disease, or unspecified chronic kidney disease: Secondary | ICD-10-CM | POA: Diagnosis not present

## 2015-12-18 DIAGNOSIS — I480 Paroxysmal atrial fibrillation: Secondary | ICD-10-CM | POA: Diagnosis not present

## 2015-12-18 DIAGNOSIS — I4891 Unspecified atrial fibrillation: Secondary | ICD-10-CM

## 2015-12-18 DIAGNOSIS — N186 End stage renal disease: Secondary | ICD-10-CM | POA: Diagnosis not present

## 2015-12-18 DIAGNOSIS — Z992 Dependence on renal dialysis: Secondary | ICD-10-CM | POA: Diagnosis not present

## 2015-12-18 LAB — POCT INR: INR: 2.5

## 2015-12-19 DIAGNOSIS — T8249XA Other complication of vascular dialysis catheter, initial encounter: Secondary | ICD-10-CM | POA: Diagnosis not present

## 2015-12-19 DIAGNOSIS — N186 End stage renal disease: Secondary | ICD-10-CM | POA: Diagnosis not present

## 2015-12-19 DIAGNOSIS — I482 Chronic atrial fibrillation: Secondary | ICD-10-CM | POA: Diagnosis not present

## 2015-12-19 DIAGNOSIS — D688 Other specified coagulation defects: Secondary | ICD-10-CM | POA: Diagnosis not present

## 2015-12-19 DIAGNOSIS — R51 Headache: Secondary | ICD-10-CM | POA: Diagnosis not present

## 2015-12-19 DIAGNOSIS — Z23 Encounter for immunization: Secondary | ICD-10-CM | POA: Diagnosis not present

## 2015-12-19 DIAGNOSIS — D509 Iron deficiency anemia, unspecified: Secondary | ICD-10-CM | POA: Diagnosis not present

## 2015-12-21 ENCOUNTER — Other Ambulatory Visit (HOSPITAL_COMMUNITY): Payer: Self-pay | Admitting: Interventional Radiology

## 2015-12-21 ENCOUNTER — Other Ambulatory Visit (HOSPITAL_COMMUNITY): Payer: Medicare Other

## 2015-12-21 ENCOUNTER — Ambulatory Visit (HOSPITAL_COMMUNITY)
Admission: RE | Admit: 2015-12-21 | Discharge: 2015-12-21 | Disposition: A | Discharge status: Home / Self Care | Payer: Medicare Other | Source: Ambulatory Visit | Attending: Interventional Radiology | Admitting: Interventional Radiology

## 2015-12-21 ENCOUNTER — Encounter (HOSPITAL_COMMUNITY): Payer: Self-pay

## 2015-12-21 DIAGNOSIS — N1330 Unspecified hydronephrosis: Secondary | ICD-10-CM

## 2015-12-21 DIAGNOSIS — Z436 Encounter for attention to other artificial openings of urinary tract: Secondary | ICD-10-CM | POA: Diagnosis not present

## 2015-12-21 DIAGNOSIS — N136 Pyonephrosis: Secondary | ICD-10-CM | POA: Insufficient documentation

## 2015-12-21 DIAGNOSIS — N131 Hydronephrosis with ureteral stricture, not elsewhere classified: Secondary | ICD-10-CM | POA: Diagnosis not present

## 2015-12-21 MED ORDER — IOPAMIDOL (ISOVUE-300) INJECTION 61%
50.0000 mL | Freq: Once | INTRAVENOUS | Status: AC | PRN
  Administered 2015-12-21: 5 mL

## 2015-12-21 MED ORDER — DEXTROSE 5 % IV SOLN
2.0000 g | Freq: Once | INTRAVENOUS | Status: AC
  Administered 2015-12-21: 2 g via INTRAVENOUS
  Filled 2015-12-21: qty 2

## 2015-12-21 MED ORDER — SODIUM CHLORIDE 0.9 % IV SOLN: INTRAVENOUS | Status: DC

## 2015-12-21 NOTE — Progress Notes (Signed)
Pt came back to short stay to finish IV antibiotics.  Pt received no conscious sedation.

## 2015-12-21 NOTE — Progress Notes (Signed)
Iv antibiotic resumed.  Pt watching tv, sipping water.

## 2015-12-21 NOTE — Procedures (Signed)
Interventional Radiology Procedure Note  Procedure: Routine exchange of right PCN, with new 59F DM placed.  Complications: None Recommendations:  - Ok to shower tomorrow - Do not submerge   - Routine exchange in 6 weeks. If there is desire to have the PCN removed in the future, would discuss with nephrology directly given the patient's history of urosepsis and recurrent UTI/pyonephrosis - Routine care   Signed,  Dulcy Fanny. Earleen Newport, DO

## 2015-12-21 NOTE — Progress Notes (Signed)
Noted that Pt's IV site had infiltrated upon arrival to short stay,  Iv antibiotics paused. removed iv catheter, pressure dressing applied.

## 2015-12-21 NOTE — Progress Notes (Signed)
Iv antibiotic complete, pt to be d/c home with daughter.

## 2015-12-21 NOTE — Discharge Instructions (Signed)
-   Ok to shower tomorrow - Do not submerge  - Routine exchange in 6 weeks  Percutaneous Nephrostomy, Care After Refer to this sheet in the next few weeks. These instructions provide you with information on caring for yourself after your procedure. Your health care provider may also give you more specific instructions. Your treatment has been planned according to current medical practices, but problems sometimes occur. Call your health care provider if you have any problems or questions after your procedure. WHAT TO EXPECT AFTER THE PROCEDURE You will need to remain lying down for several hours. HOME CARE INSTRUCTIONS  Your nephrostomy tube is connected to a leg bag or bedside drainage bag. Always keep the tubing, the leg bag, or the bedside drainage bags below the level of the kidney so that the urine drains freely.  During the day, if you are connecting the nephrostomy tube to a leg bag, be sure there are no kinks in the tubing and that the urine is draining freely.  At night, you may want to connect the nephrostomy tube or the leg bag to a larger bedside drainage bag.  Change the dressing as often as directed by your health care provider, or if it becomes wet.  Gently remove the tapes and dressing from around the nephrostomy tube. Be careful not to pull on the tube while removing the dressing.  Wash the skin around the tube, rinse well, and dry.  Place two split drain sponges in and around the tube exit site.  Place tape around edge of the dressing.  Secure the nephrostomy tubing. Remember to make certain that the nephrostomy tube does not kink or become pinched closed. It can be useful to wrap any exposed tubing going from the nephrostomy tube to any of the connecting tubes to either the leg bag or drainage bag with an elastic bandage.  Every three weeks, replace the leg bag, drainage bag, and any extension tubing connected to your nephrostomy tube. Your health care provider will  explain how to change the drainage bag and extension tubing. SEEK MEDICAL CARE IF:  You experience any problems with any of the valves or tubing.  You have persistent pain or soreness in your back.  You have a fever or chills. SEEK IMMEDIATE MEDICAL CARE IF:  You have abdominal pain during the first week.  You have a new appearance of blood in your urine.  You have back pain that is not relieved by your medicine.  You have drainage, redness, swelling, or pain at the tube insertion site.  You have decreased urine output.  Your nephrostomy tube comes out.   This information is not intended to replace advice given to you by your health care provider. Make sure you discuss any questions you have with your health care provider.   Document Released: 01/28/2004 Document Revised: 06/27/2014 Document Reviewed: 01/31/2013 Elsevier Interactive Patient Education [employer].

## 2015-12-21 NOTE — Progress Notes (Signed)
Pt difficult IV start, IV team is held up, anesthesia was called and they started IV site in order to finish IV antibiotics.

## 2015-12-25 ENCOUNTER — Ambulatory Visit (INDEPENDENT_AMBULATORY_CARE_PROVIDER_SITE_OTHER): Payer: Medicare Other | Admitting: *Deleted

## 2015-12-25 DIAGNOSIS — I480 Paroxysmal atrial fibrillation: Secondary | ICD-10-CM

## 2015-12-25 DIAGNOSIS — Z5181 Encounter for therapeutic drug level monitoring: Secondary | ICD-10-CM | POA: Diagnosis not present

## 2015-12-25 DIAGNOSIS — I4891 Unspecified atrial fibrillation: Secondary | ICD-10-CM

## 2015-12-25 LAB — POCT INR: INR: 1.8

## 2016-01-01 ENCOUNTER — Ambulatory Visit (INDEPENDENT_AMBULATORY_CARE_PROVIDER_SITE_OTHER): Payer: Medicare Other | Admitting: *Deleted

## 2016-01-01 DIAGNOSIS — I4891 Unspecified atrial fibrillation: Secondary | ICD-10-CM | POA: Diagnosis not present

## 2016-01-01 DIAGNOSIS — I480 Paroxysmal atrial fibrillation: Secondary | ICD-10-CM | POA: Diagnosis not present

## 2016-01-01 DIAGNOSIS — Z5181 Encounter for therapeutic drug level monitoring: Secondary | ICD-10-CM | POA: Diagnosis not present

## 2016-01-01 LAB — POCT INR: INR: 2.1

## 2016-01-02 DIAGNOSIS — D688 Other specified coagulation defects: Secondary | ICD-10-CM | POA: Diagnosis not present

## 2016-01-02 DIAGNOSIS — D509 Iron deficiency anemia, unspecified: Secondary | ICD-10-CM | POA: Diagnosis not present

## 2016-01-02 DIAGNOSIS — R51 Headache: Secondary | ICD-10-CM | POA: Diagnosis not present

## 2016-01-02 DIAGNOSIS — Z23 Encounter for immunization: Secondary | ICD-10-CM | POA: Diagnosis not present

## 2016-01-02 DIAGNOSIS — N186 End stage renal disease: Secondary | ICD-10-CM | POA: Diagnosis not present

## 2016-01-02 DIAGNOSIS — I482 Chronic atrial fibrillation: Secondary | ICD-10-CM | POA: Diagnosis not present

## 2016-01-02 DIAGNOSIS — T8249XA Other complication of vascular dialysis catheter, initial encounter: Secondary | ICD-10-CM | POA: Diagnosis not present

## 2016-01-15 ENCOUNTER — Ambulatory Visit (INDEPENDENT_AMBULATORY_CARE_PROVIDER_SITE_OTHER): Payer: Medicare Other | Admitting: *Deleted

## 2016-01-15 DIAGNOSIS — Z5181 Encounter for therapeutic drug level monitoring: Secondary | ICD-10-CM | POA: Diagnosis not present

## 2016-01-15 DIAGNOSIS — N186 End stage renal disease: Secondary | ICD-10-CM | POA: Diagnosis not present

## 2016-01-15 DIAGNOSIS — I480 Paroxysmal atrial fibrillation: Secondary | ICD-10-CM

## 2016-01-15 DIAGNOSIS — I120 Hypertensive chronic kidney disease with stage 5 chronic kidney disease or end stage renal disease: Secondary | ICD-10-CM | POA: Diagnosis not present

## 2016-01-15 DIAGNOSIS — I4891 Unspecified atrial fibrillation: Secondary | ICD-10-CM | POA: Diagnosis not present

## 2016-01-15 DIAGNOSIS — Z1389 Encounter for screening for other disorder: Secondary | ICD-10-CM | POA: Diagnosis not present

## 2016-01-15 DIAGNOSIS — Z79899 Other long term (current) drug therapy: Secondary | ICD-10-CM | POA: Diagnosis not present

## 2016-01-15 DIAGNOSIS — K219 Gastro-esophageal reflux disease without esophagitis: Secondary | ICD-10-CM | POA: Diagnosis not present

## 2016-01-15 DIAGNOSIS — N185 Chronic kidney disease, stage 5: Secondary | ICD-10-CM | POA: Diagnosis not present

## 2016-01-15 DIAGNOSIS — Z0000 Encounter for general adult medical examination without abnormal findings: Secondary | ICD-10-CM | POA: Diagnosis not present

## 2016-01-15 DIAGNOSIS — N200 Calculus of kidney: Secondary | ICD-10-CM | POA: Diagnosis not present

## 2016-01-15 LAB — POCT INR: INR: 1.6

## 2016-01-18 DIAGNOSIS — Z992 Dependence on renal dialysis: Secondary | ICD-10-CM | POA: Diagnosis not present

## 2016-01-18 DIAGNOSIS — I129 Hypertensive chronic kidney disease with stage 1 through stage 4 chronic kidney disease, or unspecified chronic kidney disease: Secondary | ICD-10-CM | POA: Diagnosis not present

## 2016-01-18 DIAGNOSIS — N186 End stage renal disease: Secondary | ICD-10-CM | POA: Diagnosis not present

## 2016-01-19 DIAGNOSIS — R51 Headache: Secondary | ICD-10-CM | POA: Diagnosis not present

## 2016-01-19 DIAGNOSIS — R6883 Chills (without fever): Secondary | ICD-10-CM | POA: Diagnosis not present

## 2016-01-19 DIAGNOSIS — D509 Iron deficiency anemia, unspecified: Secondary | ICD-10-CM | POA: Diagnosis not present

## 2016-01-19 DIAGNOSIS — N186 End stage renal disease: Secondary | ICD-10-CM | POA: Diagnosis not present

## 2016-01-19 DIAGNOSIS — D631 Anemia in chronic kidney disease: Secondary | ICD-10-CM | POA: Diagnosis not present

## 2016-01-19 DIAGNOSIS — T8249XA Other complication of vascular dialysis catheter, initial encounter: Secondary | ICD-10-CM | POA: Diagnosis not present

## 2016-01-19 DIAGNOSIS — D688 Other specified coagulation defects: Secondary | ICD-10-CM | POA: Diagnosis not present

## 2016-01-19 DIAGNOSIS — N390 Urinary tract infection, site not specified: Secondary | ICD-10-CM | POA: Diagnosis not present

## 2016-01-19 DIAGNOSIS — Z23 Encounter for immunization: Secondary | ICD-10-CM | POA: Diagnosis not present

## 2016-01-19 DIAGNOSIS — I482 Chronic atrial fibrillation: Secondary | ICD-10-CM | POA: Diagnosis not present

## 2016-01-26 DIAGNOSIS — N184 Chronic kidney disease, stage 4 (severe): Secondary | ICD-10-CM | POA: Diagnosis not present

## 2016-01-26 DIAGNOSIS — N3020 Other chronic cystitis without hematuria: Secondary | ICD-10-CM | POA: Diagnosis not present

## 2016-01-26 DIAGNOSIS — N3021 Other chronic cystitis with hematuria: Secondary | ICD-10-CM | POA: Diagnosis not present

## 2016-01-26 DIAGNOSIS — N1330 Unspecified hydronephrosis: Secondary | ICD-10-CM | POA: Diagnosis not present

## 2016-01-27 ENCOUNTER — Ambulatory Visit
Admission: RE | Admit: 2016-01-27 | Discharge: 2016-01-27 | Disposition: A | Discharge status: Home / Self Care | Payer: Medicare Other | Source: Ambulatory Visit | Attending: Geriatric Medicine | Admitting: Geriatric Medicine

## 2016-01-27 ENCOUNTER — Other Ambulatory Visit: Payer: Self-pay | Admitting: Geriatric Medicine

## 2016-01-27 DIAGNOSIS — R0781 Pleurodynia: Secondary | ICD-10-CM | POA: Diagnosis not present

## 2016-01-27 DIAGNOSIS — R079 Chest pain, unspecified: Secondary | ICD-10-CM | POA: Diagnosis not present

## 2016-01-27 DIAGNOSIS — N186 End stage renal disease: Secondary | ICD-10-CM | POA: Diagnosis not present

## 2016-01-27 DIAGNOSIS — I129 Hypertensive chronic kidney disease with stage 1 through stage 4 chronic kidney disease, or unspecified chronic kidney disease: Secondary | ICD-10-CM | POA: Diagnosis not present

## 2016-01-29 ENCOUNTER — Ambulatory Visit (INDEPENDENT_AMBULATORY_CARE_PROVIDER_SITE_OTHER): Payer: Medicare Other | Admitting: Pharmacist

## 2016-01-29 DIAGNOSIS — Z5181 Encounter for therapeutic drug level monitoring: Secondary | ICD-10-CM

## 2016-01-29 DIAGNOSIS — I4891 Unspecified atrial fibrillation: Secondary | ICD-10-CM

## 2016-01-29 DIAGNOSIS — I480 Paroxysmal atrial fibrillation: Secondary | ICD-10-CM

## 2016-01-29 LAB — POCT INR: INR: 1.5

## 2016-01-30 ENCOUNTER — Encounter (HOSPITAL_COMMUNITY): Payer: Self-pay

## 2016-01-30 ENCOUNTER — Inpatient Hospital Stay (HOSPITAL_COMMUNITY)
Admission: EM | Admit: 2016-01-30 | Discharge: 2016-02-03 | DRG: 871 | Disposition: A | Discharge status: Home / Self Care | Payer: Medicare Other | Attending: Internal Medicine | Admitting: Internal Medicine

## 2016-01-30 ENCOUNTER — Inpatient Hospital Stay (HOSPITAL_COMMUNITY): Payer: Medicare Other

## 2016-01-30 ENCOUNTER — Emergency Department (HOSPITAL_COMMUNITY): Payer: Medicare Other

## 2016-01-30 DIAGNOSIS — IMO0002 Reserved for concepts with insufficient information to code with codable children: Secondary | ICD-10-CM

## 2016-01-30 DIAGNOSIS — Z885 Allergy status to narcotic agent status: Secondary | ICD-10-CM

## 2016-01-30 DIAGNOSIS — I120 Hypertensive chronic kidney disease with stage 5 chronic kidney disease or end stage renal disease: Secondary | ICD-10-CM | POA: Diagnosis not present

## 2016-01-30 DIAGNOSIS — Z7901 Long term (current) use of anticoagulants: Secondary | ICD-10-CM | POA: Diagnosis not present

## 2016-01-30 DIAGNOSIS — Z87440 Personal history of urinary (tract) infections: Secondary | ICD-10-CM

## 2016-01-30 DIAGNOSIS — Z22322 Carrier or suspected carrier of Methicillin resistant Staphylococcus aureus: Secondary | ICD-10-CM | POA: Diagnosis not present

## 2016-01-30 DIAGNOSIS — A419 Sepsis, unspecified organism: Principal | ICD-10-CM

## 2016-01-30 DIAGNOSIS — Z8249 Family history of ischemic heart disease and other diseases of the circulatory system: Secondary | ICD-10-CM | POA: Diagnosis not present

## 2016-01-30 DIAGNOSIS — K219 Gastro-esophageal reflux disease without esophagitis: Secondary | ICD-10-CM | POA: Diagnosis present

## 2016-01-30 DIAGNOSIS — Z79899 Other long term (current) drug therapy: Secondary | ICD-10-CM

## 2016-01-30 DIAGNOSIS — R531 Weakness: Secondary | ICD-10-CM | POA: Diagnosis not present

## 2016-01-30 DIAGNOSIS — R918 Other nonspecific abnormal finding of lung field: Secondary | ICD-10-CM | POA: Diagnosis not present

## 2016-01-30 DIAGNOSIS — R6883 Chills (without fever): Secondary | ICD-10-CM | POA: Insufficient documentation

## 2016-01-30 DIAGNOSIS — E785 Hyperlipidemia, unspecified: Secondary | ICD-10-CM | POA: Diagnosis present

## 2016-01-30 DIAGNOSIS — Z888 Allergy status to other drugs, medicaments and biological substances status: Secondary | ICD-10-CM

## 2016-01-30 DIAGNOSIS — N135 Crossing vessel and stricture of ureter without hydronephrosis: Secondary | ICD-10-CM | POA: Diagnosis not present

## 2016-01-30 DIAGNOSIS — Z992 Dependence on renal dialysis: Secondary | ICD-10-CM | POA: Diagnosis not present

## 2016-01-30 DIAGNOSIS — I480 Paroxysmal atrial fibrillation: Secondary | ICD-10-CM | POA: Diagnosis not present

## 2016-01-30 DIAGNOSIS — R404 Transient alteration of awareness: Secondary | ICD-10-CM | POA: Diagnosis not present

## 2016-01-30 DIAGNOSIS — I5032 Chronic diastolic (congestive) heart failure: Secondary | ICD-10-CM | POA: Diagnosis not present

## 2016-01-30 DIAGNOSIS — B965 Pseudomonas (aeruginosa) (mallei) (pseudomallei) as the cause of diseases classified elsewhere: Secondary | ICD-10-CM | POA: Diagnosis present

## 2016-01-30 DIAGNOSIS — Z833 Family history of diabetes mellitus: Secondary | ICD-10-CM

## 2016-01-30 DIAGNOSIS — D631 Anemia in chronic kidney disease: Secondary | ICD-10-CM | POA: Diagnosis present

## 2016-01-30 DIAGNOSIS — I429 Cardiomyopathy, unspecified: Secondary | ICD-10-CM | POA: Diagnosis not present

## 2016-01-30 DIAGNOSIS — I10 Essential (primary) hypertension: Secondary | ICD-10-CM | POA: Diagnosis present

## 2016-01-30 DIAGNOSIS — N390 Urinary tract infection, site not specified: Secondary | ICD-10-CM | POA: Diagnosis present

## 2016-01-30 DIAGNOSIS — I132 Hypertensive heart and chronic kidney disease with heart failure and with stage 5 chronic kidney disease, or end stage renal disease: Secondary | ICD-10-CM | POA: Diagnosis present

## 2016-01-30 DIAGNOSIS — H409 Unspecified glaucoma: Secondary | ICD-10-CM | POA: Diagnosis present

## 2016-01-30 DIAGNOSIS — N186 End stage renal disease: Secondary | ICD-10-CM | POA: Diagnosis not present

## 2016-01-30 DIAGNOSIS — J189 Pneumonia, unspecified organism: Secondary | ICD-10-CM | POA: Diagnosis present

## 2016-01-30 DIAGNOSIS — R509 Fever, unspecified: Secondary | ICD-10-CM | POA: Diagnosis not present

## 2016-01-30 DIAGNOSIS — E876 Hypokalemia: Secondary | ICD-10-CM | POA: Diagnosis not present

## 2016-01-30 DIAGNOSIS — M7989 Other specified soft tissue disorders: Secondary | ICD-10-CM | POA: Diagnosis not present

## 2016-01-30 DIAGNOSIS — I4891 Unspecified atrial fibrillation: Secondary | ICD-10-CM | POA: Diagnosis not present

## 2016-01-30 LAB — CBC WITH DIFFERENTIAL/PLATELET
Basophils Absolute: 0 10*3/uL (ref 0.0–0.1)
Basophils Relative: 0 %
Eosinophils Absolute: 0 10*3/uL (ref 0.0–0.7)
Eosinophils Relative: 0 %
HCT: 30.3 % — ABNORMAL LOW (ref 36.0–46.0)
Hemoglobin: 9.3 g/dL — ABNORMAL LOW (ref 12.0–15.0)
Lymphocytes Relative: 4 %
Lymphs Abs: 0.5 10*3/uL — ABNORMAL LOW (ref 0.7–4.0)
MCH: 27.1 pg (ref 26.0–34.0)
MCHC: 30.7 g/dL (ref 30.0–36.0)
MCV: 88.3 fL (ref 78.0–100.0)
Monocytes Absolute: 0.7 10*3/uL (ref 0.1–1.0)
Monocytes Relative: 5 %
Neutro Abs: 12.9 10*3/uL — ABNORMAL HIGH (ref 1.7–7.7)
Neutrophils Relative %: 91 %
Platelets: 184 10*3/uL (ref 150–400)
RBC: 3.43 MIL/uL — ABNORMAL LOW (ref 3.87–5.11)
RDW: 17.9 % — ABNORMAL HIGH (ref 11.5–15.5)
WBC: 14.2 10*3/uL — ABNORMAL HIGH (ref 4.0–10.5)

## 2016-01-30 LAB — URINALYSIS, ROUTINE W REFLEX MICROSCOPIC
Bilirubin Urine: NEGATIVE
Glucose, UA: NEGATIVE mg/dL
Ketones, ur: NEGATIVE mg/dL
Nitrite: NEGATIVE
Protein, ur: 100 mg/dL — AB
Specific Gravity, Urine: 1.014 (ref 1.005–1.030)
pH: 8 (ref 5.0–8.0)

## 2016-01-30 LAB — COMPREHENSIVE METABOLIC PANEL
ALT: 25 U/L (ref 14–54)
AST: 36 U/L (ref 15–41)
Albumin: 2.7 g/dL — ABNORMAL LOW (ref 3.5–5.0)
Alkaline Phosphatase: 82 U/L (ref 38–126)
Anion gap: 8 (ref 5–15)
BUN: 9 mg/dL (ref 6–20)
CO2: 31 mmol/L (ref 22–32)
Calcium: 8 mg/dL — ABNORMAL LOW (ref 8.9–10.3)
Chloride: 95 mmol/L — ABNORMAL LOW (ref 101–111)
Creatinine, Ser: 2.81 mg/dL — ABNORMAL HIGH (ref 0.44–1.00)
GFR calc Af Amer: 18 mL/min — ABNORMAL LOW (ref >60)
GFR calc non Af Amer: 16 mL/min — ABNORMAL LOW (ref >60)
Glucose, Bld: 113 mg/dL — ABNORMAL HIGH (ref 65–99)
Potassium: 3.2 mmol/L — ABNORMAL LOW (ref 3.5–5.1)
Sodium: 134 mmol/L — ABNORMAL LOW (ref 135–145)
Total Bilirubin: 0.7 mg/dL (ref 0.3–1.2)
Total Protein: 5.8 g/dL — ABNORMAL LOW (ref 6.5–8.1)

## 2016-01-30 LAB — LACTIC ACID, PLASMA
Lactic Acid, Venous: 1.1 mmol/L (ref 0.5–1.9)
Lactic Acid, Venous: 1.8 mmol/L (ref 0.5–1.9)

## 2016-01-30 LAB — URINE MICROSCOPIC-ADD ON

## 2016-01-30 LAB — I-STAT CG4 LACTIC ACID, ED
Lactic Acid, Venous: 0.8 mmol/L (ref 0.5–1.9)
Lactic Acid, Venous: 1.02 mmol/L (ref 0.5–1.9)

## 2016-01-30 LAB — TROPONIN I: Troponin I: 0.03 ng/mL (ref <0.03)

## 2016-01-30 LAB — PROCALCITONIN: Procalcitonin: 0.49 ng/mL

## 2016-01-30 LAB — PROTIME-INR
INR: 1.66
Prothrombin Time: 19.8 seconds — ABNORMAL HIGH (ref 11.4–15.2)

## 2016-01-30 MED ORDER — LATANOPROST 0.005 % OP SOLN
1.0000 [drp] | Freq: Every day | OPHTHALMIC | Status: DC
  Administered 2016-01-31 – 2016-02-02 (×3): 1 [drp] via OPHTHALMIC
  Filled 2016-01-30 (×3): qty 2.5

## 2016-01-30 MED ORDER — TIMOLOL MALEATE 0.5 % OP SOLN
1.0000 [drp] | Freq: Every morning | OPHTHALMIC | Status: DC
  Administered 2016-02-01 – 2016-02-03 (×3): 1 [drp] via OPHTHALMIC
  Filled 2016-01-30: qty 5

## 2016-01-30 MED ORDER — VANCOMYCIN HCL IN DEXTROSE 750-5 MG/150ML-% IV SOLN
750.0000 mg | INTRAVENOUS | Status: DC
  Administered 2016-02-02: 750 mg via INTRAVENOUS
  Filled 2016-01-30: qty 150

## 2016-01-30 MED ORDER — FAMOTIDINE 20 MG PO TABS
20.0000 mg | ORAL_TABLET | Freq: Two times a day (BID) | ORAL | Status: DC
  Administered 2016-01-31 (×2): 20 mg via ORAL
  Filled 2016-01-30 (×2): qty 1

## 2016-01-30 MED ORDER — ACETAMINOPHEN 500 MG PO TABS
1000.0000 mg | ORAL_TABLET | Freq: Four times a day (QID) | ORAL | Status: DC | PRN
  Administered 2016-01-31 – 2016-02-03 (×9): 1000 mg via ORAL
  Filled 2016-01-30 (×9): qty 2

## 2016-01-30 MED ORDER — NEPRO/CARBSTEADY PO LIQD
237.0000 mL | Freq: Every day | ORAL | Status: DC | PRN
Start: 2016-01-30 — End: 2016-02-03
  Filled 2016-01-30: qty 237

## 2016-01-30 MED ORDER — PIPERACILLIN-TAZOBACTAM 3.375 G IVPB
3.3750 g | Freq: Two times a day (BID) | INTRAVENOUS | Status: DC
  Administered 2016-01-31 – 2016-02-03 (×6): 3.375 g via INTRAVENOUS
  Filled 2016-01-30 (×10): qty 50

## 2016-01-30 MED ORDER — PIPERACILLIN-TAZOBACTAM 3.375 G IVPB 30 MIN
3.3750 g | Freq: Once | INTRAVENOUS | Status: AC
  Administered 2016-01-30: 3.375 g via INTRAVENOUS
  Filled 2016-01-30: qty 50

## 2016-01-30 MED ORDER — AMIODARONE HCL 200 MG PO TABS
200.0000 mg | ORAL_TABLET | Freq: Every day | ORAL | Status: DC
  Administered 2016-01-31 – 2016-02-03 (×4): 200 mg via ORAL
  Filled 2016-01-30 (×4): qty 1

## 2016-01-30 MED ORDER — SACCHAROMYCES BOULARDII 250 MG PO CAPS
250.0000 mg | ORAL_CAPSULE | Freq: Two times a day (BID) | ORAL | Status: DC
  Administered 2016-01-31 – 2016-02-03 (×7): 250 mg via ORAL
  Filled 2016-01-30 (×7): qty 1

## 2016-01-30 MED ORDER — SALINE SPRAY 0.65 % NA SOLN: 1.0000 | Freq: Three times a day (TID) | NASAL | Status: DC | PRN

## 2016-01-30 MED ORDER — ADULT MULTIVITAMIN W/MINERALS CH
1.0000 | ORAL_TABLET | Freq: Every day | ORAL | Status: DC
  Administered 2016-01-31 – 2016-02-03 (×5): 1 via ORAL
  Filled 2016-01-30 (×5): qty 1

## 2016-01-30 MED ORDER — VANCOMYCIN HCL IN DEXTROSE 750-5 MG/150ML-% IV SOLN
750.0000 mg | Freq: Once | INTRAVENOUS | Status: AC
  Administered 2016-01-30: 750 mg via INTRAVENOUS
  Filled 2016-01-30: qty 150

## 2016-01-30 MED ORDER — WARFARIN SODIUM 2.5 MG PO TABS
2.5000 mg | ORAL_TABLET | ORAL | Status: AC
Start: 2016-01-30 — End: 2016-01-30
  Administered 2016-01-30: 2.5 mg via ORAL
  Filled 2016-01-30: qty 1

## 2016-01-30 MED ORDER — SODIUM CHLORIDE 0.9 % IV BOLUS (SEPSIS)
500.0000 mL | Freq: Once | INTRAVENOUS | Status: AC
  Administered 2016-01-30: 500 mL via INTRAVENOUS

## 2016-01-30 MED ORDER — WARFARIN - PHARMACIST DOSING INPATIENT
Freq: Every day | Status: DC
  Administered 2016-02-02: 18:00:00

## 2016-01-30 MED ORDER — ACETAMINOPHEN 325 MG PO TABS
325.0000 mg | ORAL_TABLET | Freq: Once | ORAL | Status: AC
  Administered 2016-01-30: 325 mg via ORAL
  Filled 2016-01-30: qty 1

## 2016-01-30 NOTE — Progress Notes (Signed)
ANTICOAGULATION CONSULT NOTE - Initial Consult  Pharmacy Consult for warfarin Indication: atrial fibrillation  Allergies  Allergen Reactions  . Vicodin [Hydrocodone-Acetaminophen] Nausea And Vomiting  . Chlorhexidine Rash    Sunburn    rash  . Percocet [Oxycodone-Acetaminophen] Nausea And Vomiting    Patient Measurements: Height: 5\' 3"  (160 cm) Weight: 175 lb (79.4 kg) IBW/kg (Calculated) : 52.4   Vital Signs: Temp: 102.4 F (39.1 C) (08/12 1740) Temp Source: Rectal (08/12 1740) BP: 101/60 (08/12 1900) Pulse Rate: 91 (08/12 1900)  Labs:  Recent Labs  01/29/16 0851 01/30/16 1736  HGB  --  9.3*  HCT  --  30.3*  PLT  --  184  LABPROT  --  19.8*  INR 1.5 1.66  CREATININE  --  2.81*  TROPONINI  --  0.03*    Estimated Creatinine Clearance: 18.3 mL/min (by C-G formula based on SCr of 2.81 mg/dL).   Medical History: Past Medical History:  Diagnosis Date  . Cardiomyopathy ([place])    a. h/o LV dysfunction EF 20-25% in 2013 due to sepsis.>> improved to normal   . Chronic diastolic CHF (congestive heart failure) ([place])    10/ 2013 in setting of septic shock  . Complication of anesthesia    use a little anesthesia , per patient MD states she quit breathing (2016); hard to wake up  . ESRD (end stage renal disease) ([place])    dialysis Tues, Thurs, Sat  . GERD (gastroesophageal reflux disease)   . Glaucoma   . Glaucoma   . H/O hiatal hernia    a. CT 2017: large gastric hiatal hernia.  Marland Kitchen Headache(784.0)    migraine  . History of blood transfusion 04/13/2015   . History of echocardiogram    a. Echo 6/17: EF 60-65%, normal wall motion, mild MR, atrial septal lipomatous hypertrophy, PASP 34 mmHg, possible trivial free-flowing pericardial effusion along RV free wall // b. Echo 5/17: Mild LVH, EF 55-60%, normal wall motion, grade 1 diastolic dysfunction, trivial MR, severe LAE, mild RAE, PASP 42 mmHg  . History of nephrostomy 04/11/2015    currently inplace 04/28/2015   .  History of nuclear stress test    a. Myoview 1/14 - Marked ischemia in the basal anterior, mid anterior, apical septal and apical inferior regions, EF 63% >> LHC normal   . Hyperlipidemia   . Hypertension    medication removed from regimen due to low blood pressure   . Iron deficiency    hx  . Nephrolithiasis 2002, 2006   bilateral  . Normal coronary arteries 2014   a. LHC in 1/14: normal coronary arteries  . PAF (paroxysmal atrial fibrillation) ([place])    a. 10/ 2013  in setting of Septic Shock //  b. recurrent during admit for pneumonia, L effusion >> placed on Amiodarone // Coumadin for anticoagulation  . Pneumonia    dx 10-06-2014 per CXR--  on 10-27-2014 pt states finished antibiotic and denies cough or fever  . S/P hemodialysis catheter insertion ([place]) 04/11/2015    right anterior chest , only used once   . Sigmoid diverticulosis     Assessment: 32 yof on warfarin pta for afib. INR 1.66 on admit (1.5 at yesterday's anticoag clinic visit). Will continue with dosing change made at visit. Hg 9.3, plt wnl, no bleed documented.  PTA dose: 2.5mg  except 5mg  on MTh (per 8/11 Anticoag visit note). Last dose 8/11 pta  Goal of Therapy:  INR 2-3 Monitor platelets by anticoagulation protocol: Yes  Plan:  Warfarin 2.5mg  x1 dose tonight Daily INR, monitor s/sx bleeding   [name], PharmD, [facility] Clinical Pharmacist Pager [phone] 01/30/2016 7:35 PM

## 2016-01-30 NOTE — ED Triage Notes (Signed)
Per [facility], Pt receives Dialysis TThS. Pt received full treatment today when she noticed to be extremely fatigue and generalized weakness. Pt was given 1000 mg of Vancomycin at Dialysis Clinic by MD. Pt has Hx of UTI and Sepsis. Given 4 mf of Zofran. Had episode of nausea with EMS. Vitals per EMS 114/50, 100 HR, 98% on RA

## 2016-01-30 NOTE — ED Notes (Signed)
Daughter reports confusion that started today.

## 2016-01-30 NOTE — Progress Notes (Signed)
Pharmacy Antibiotic Note  [name] is a 70 y.o. female admitted on 01/30/2016 with sepsis.  Pharmacy has been consulted for vancomycin and zosyn dosing.  Pt received vancomycin 1g at the dialysis clinic. Will give additional 750mg  for total load of 1750mg  along with zosyn 3.375g IV once in the ED.  Plan: Vancomycin 750mg  IV qHD Zosyn 3.375g IV q12h Monitor culture data, HD plan and clinical course VT at SS prn  Height: 5\' 3"  (160 cm) Weight: 175 lb (79.4 kg) IBW/kg (Calculated) : 52.4  Temp (24hrs), Avg:100.6 F (38.1 C), Min:98.7 F (37.1 C), Max:102.4 F (39.1 C)   Recent Labs Lab 01/30/16 1734 01/30/16 1736  WBC  --  14.2*  CREATININE  --  2.81*  LATICACIDVEN 0.80  --     Estimated Creatinine Clearance: 18.3 mL/min (by C-G formula based on SCr of 2.81 mg/dL).    Allergies  Allergen Reactions  . Vicodin [Hydrocodone-Acetaminophen] Nausea And Vomiting  . Chlorhexidine Rash    Sunburn    rash  . Percocet [Oxycodone-Acetaminophen] Nausea And Vomiting    Antimicrobials this admission: Vanc 8/12 >>  Zosyn 8/12 >>   Dose adjustments this admission: n/a  Microbiology results: 8/12 BCx: sent 8/12 UCx: sent   Sputum:    MRSA PCR:    Andrey Cota. Diona Foley, PharmD, BCPS Clinical Pharmacist 01/30/2016 6:21 PM

## 2016-01-30 NOTE — ED Notes (Signed)
Notified radiology that pt is ready for xray

## 2016-01-30 NOTE — ED Provider Notes (Addendum)
[facility] DEPT Provider Note   CSN: [id number] Arrival date & time: 01/30/16  1639  First Provider Contact:  First MD Initiated Contact with Patient 01/30/16 1642        History   Chief Complaint Chief Complaint  Patient presents with  . Weakness    HPI [name] is a 70 y.o. female.  Patient with hx esrd on HD, was at her normal HD today, when became hypotensive, clammy, nauseated.  Patient was given vancomycin iv for suspected infection, and transferred to ED via EMS.  Patient denies fevers. No chills. Hx recurrent uti/sepsis, and has right ureteral stent and urostomy. Still makes small amounts urine. No dysuria or urgency. Denies flank or back pain. No abd pain. No vomiting or diarrhea. Denies headache. No chest pain or discomfort. No sob. No cough or uri c/o.  Is noted to have erythematous area over left upper extremity HD graft, however pt/family indicate that is normal for her.  Right chest HD cath, without no increased pain, erythema or discharge.      The history is provided by the patient, the EMS personnel and a relative.    Past Medical History:  Diagnosis Date  . Cardiomyopathy ([place])    a. h/o LV dysfunction EF 20-25% in 2013 due to sepsis.>> improved to normal   . Chronic diastolic CHF (congestive heart failure) ([place])    10/ 2013 in setting of septic shock  . Complication of anesthesia    use a little anesthesia , per patient MD states she quit breathing (2016); hard to wake up  . ESRD (end stage renal disease) ([place])    dialysis Tues, Thurs, Sat  . GERD (gastroesophageal reflux disease)   . Glaucoma   . Glaucoma   . H/O hiatal hernia    a. CT 2017: large gastric hiatal hernia.  Marland Kitchen Headache(784.0)    migraine  . History of blood transfusion 04/13/2015   . History of echocardiogram    a. Echo 6/17: EF 60-65%, normal wall motion, mild MR, atrial septal lipomatous hypertrophy, PASP 34 mmHg, possible trivial free-flowing pericardial effusion along RV  free wall // b. Echo 5/17: Mild LVH, EF 55-60%, normal wall motion, grade 1 diastolic dysfunction, trivial MR, severe LAE, mild RAE, PASP 42 mmHg  . History of nephrostomy 04/11/2015    currently inplace 04/28/2015   . History of nuclear stress test    a. Myoview 1/14 - Marked ischemia in the basal anterior, mid anterior, apical septal and apical inferior regions, EF 63% >> LHC normal   . Hyperlipidemia   . Hypertension    medication removed from regimen due to low blood pressure   . Iron deficiency    hx  . Nephrolithiasis 2002, 2006   bilateral  . Normal coronary arteries 2014   a. LHC in 1/14: normal coronary arteries  . PAF (paroxysmal atrial fibrillation) ([place])    a. 10/ 2013  in setting of Septic Shock //  b. recurrent during admit for pneumonia, L effusion >> placed on Amiodarone // Coumadin for anticoagulation  . Pneumonia    dx 10-06-2014 per CXR--  on 10-27-2014 pt states finished antibiotic and denies cough or fever  . S/P hemodialysis catheter insertion ([place]) 04/11/2015    right anterior chest , only used once   . Sigmoid diverticulosis     Patient Active Problem List   Diagnosis Date Noted  . On amiodarone therapy 11/30/2015  . Pleural effusion on left   . Pleurisy   .  Hypoxemia   . HCAP (healthcare-associated pneumonia) 11/21/2015  . S/P thoracentesis   . Chest pain 11/20/2015  . Pleural effusion, left 11/20/2015  . Pain in the chest   . Pericardial effusion   . Pleural effusion   . Encounter for therapeutic drug monitoring 11/13/2015  . Atrial fibrillation with RVR ([place])   . PAF (paroxysmal atrial fibrillation) ([place]) 11/05/2015  . Urinary tract infectious disease 10/31/2015  . Pyelonephritis 09/25/2015  . Sepsis ([place]) 09/25/2015  . Anemia of renal disease 08/14/2015  . Severe protein-calorie malnutrition ([place]) 05/27/2015  . End stage renal disease ([place])   . Normocytic anemia 11/23/2014  . Essential hypertension, benign 08/14/2013  . Glaucoma     Past  Surgical History:  Procedure Laterality Date  . ABDOMINAL HYSTERECTOMY    . AV FISTULA PLACEMENT Left 06/02/2015   Procedure: BRACHIOCEPHALIC ARTERIOVENOUS (AV) FISTULA CREATION ;  Surgeon: Conrad Teutopolis, MD;  Location: [facility];  Service: Vascular;  Laterality: Left;  . BASCILIC VEIN TRANSPOSITION Left 07/27/2015   Procedure: FIRST STAGE BASILIC VEIN TRANSPOSITION LEFT UPPER ARM;  Surgeon: Conrad Coram, MD;  Location: [facility];  Service: Vascular;  Laterality: Left;  . BASCILIC VEIN TRANSPOSITION Left 09/2015   second phase  . BASCILIC VEIN TRANSPOSITION Left 10/12/2015   Procedure: SECOND STAGE BASILIC VEIN TRANSPOSITION LEFT ARM;  Surgeon: Conrad Beulah Valley, MD;  Location: [facility];  Service: Vascular;  Laterality: Left;  . BREAST BIOPSY Left 08/23/07   benign fibrocystic with duct ectasia  . CARDIAC CATHETERIZATION  07-11-2012  dr Irish Lack   Abnormal stress test/   normal coronary arteries/  LVEDP  45mmHg  . CARDIOVASCULAR STRESS TEST  06-26-2012  dr Irish Lack   marked ischemia in the basal anterior, mid anterior, apical inferior regions/  normal LVF, ef 63%  . CATARACT EXTRACTION W/ INTRAOCULAR LENS  IMPLANT, BILATERAL    . CYSTOSCOPY W/ URETERAL STENT PLACEMENT  04/04/2012   Procedure: CYSTOSCOPY WITH RETROGRADE PYELOGRAM/URETERAL STENT PLACEMENT;  Surgeon: Ailene Rud, MD;  Location: [facility];  Service: Urology;  Laterality: Left;  . CYSTOSCOPY W/ URETERAL STENT PLACEMENT Bilateral 05/04/2015   Procedure: CYSTOSCOPY WITH BILATERAL RETROGRADE PYELOGRAM/ WITH INTERPRETATION, EXCHANGE OF RIGHT URETERAL STENT REPLACEMENT AND PLACEMENT LEFT URETERAL STENT PLACEMENT EXAMINATION OF VAGINA;  Surgeon: Carolan Clines, MD;  Location: [facility];  Service: Urology;  Laterality: Bilateral;  . CYSTOSCOPY WITH STENT PLACEMENT Right 10/28/2014   Procedure: RIGHT URETERAL STENT PLACEMENT;  Surgeon: Irine Seal, MD;  [facility];  Service: Urology;  Laterality: Right;  . CYSTOSCOPY WITH STENT  PLACEMENT Right 02/26/2015   Procedure: CYSTOSCOPY RETROGRADE PYELOGRAM WITH STENT PLACEMENT;  Surgeon: Cleon Gustin, MD;  Location: [facility];  Service: Urology;  Laterality: Right;  . CYSTOSCOPY/RETROGRADE/URETEROSCOPY/STONE EXTRACTION WITH BASKET Right 11/21/2014   Procedure: CYSTOSCOPY/RIGHT RETROGRADE PYELOGRAM/RIGHT URETEROSCOPY/BASKET EXTRACTION/RIGHT PYELOSCOPY/LASER OF STONE/RIGHT DOUBLE J STENT;  Surgeon: Carolan Clines, MD;  Location: [facility];  Service: Urology;  Laterality: Right;  . EXTRACORPOREAL SHOCK WAVE LITHOTRIPSY  05-28-2012  &  10-08-2012  . HOLMIUM LASER APPLICATION Right [id number]   Procedure: HOLMIUM LASER APPLICATION;  Surgeon: Carolan Clines, MD;  [facility];  Service: Urology;  Laterality: Right;  . KNEE ARTHROSCOPY Left 02-14-2003  . LAPAROSCOPIC CHOLECYSTECTOMY  03-23-2005  . TOTAL ABDOMINAL HYSTERECTOMY W/ BILATERAL SALPINGOOPHORECTOMY  1993   secondary to fibroids  . TRANSTHORACIC ECHOCARDIOGRAM  04-09-2012   normal LVF,  ef 60-65%,  mild LAE,  mild TR, trivial MR and  PR    OB History    Gravida Para Term Preterm AB Living   1 1 1  0 0 1   SAB TAB Ectopic Multiple Live Births   0 0 0 0 1       Home Medications    Prior to Admission medications   Medication Sig Start Date End Date Taking? Authorizing Provider  acetaminophen (TYLENOL) 500 MG tablet Take 1,000 mg by mouth every 6 (six) hours as needed for moderate pain or headache.     Historical Provider, MD  amiodarone (PACERONE) 200 MG tablet Take 1 tablet (200 mg total) by mouth daily. 12/07/15   Liliane Shi, PA-C  bimatoprost (LUMIGAN) 0.01 % SOLN Place 1 drop into both eyes at bedtime.    Historical Provider, MD  metoprolol tartrate (LOPRESSOR) 25 MG tablet Take 1 tablet (25 mg total) by mouth 2 (two) times daily. 11/11/15   Theodis Blaze, MD  Multiple Vitamins-Minerals (MULTIVITAMIN WITH MINERALS) tablet Take 1 tablet by mouth daily.    Historical  Provider, MD  Nutritional Supplements (FEEDING SUPPLEMENT, NEPRO CARB STEADY,) LIQD Take 237 mLs by mouth daily. Patient taking differently: Take 237 mLs by mouth daily as needed (nutritional supplement).  05/07/15   Maryann Mikhail, DO  ranitidine (ZANTAC) 150 MG tablet Take 150 mg by mouth 2 (two) times daily as needed for heartburn.    Historical Provider, MD  saccharomyces boulardii (FLORASTOR) 250 MG capsule Take 1 capsule (250 mg total) by mouth 2 (two) times daily. 07/08/15   Donne Hazel, MD  sodium chloride (OCEAN) 0.65 % SOLN nasal spray Place 1 spray into both nostrils 3 (three) times daily as needed for congestion.    Historical Provider, MD  timolol (BETIMOL) 0.5 % ophthalmic solution Place 1 drop into both eyes every morning.     Historical Provider, MD  warfarin (COUMADIN) 5 MG tablet Take 1 tablet (5 mg total) by mouth daily at 6 PM. 11/23/15   Modena Jansky, MD    Family History Family History  Problem Relation Age of Onset  . Hypertension Mother   . Cancer Mother 65    breast  . Dementia Mother   . Hypertension Brother   . Diabetes Brother   . Heart disease Brother     before age 12  . Cancer Father 29    pancreatic  . Heart failure Paternal Grandmother   . Bladder Cancer Maternal Grandfather     Social History Social History  Substance Use Topics  . Smoking status: Never Smoker  . Smokeless tobacco: Never Used  . Alcohol use No     Allergies   Vicodin [hydrocodone-acetaminophen]; Chlorhexidine; and Percocet [oxycodone-acetaminophen]   Review of Systems Review of Systems  Constitutional: Negative for chills.  HENT: Negative for sore throat.   Eyes: Negative for redness.  Respiratory: Negative for cough and shortness of breath.   Cardiovascular: Negative for chest pain.  Gastrointestinal: Positive for nausea. Negative for abdominal pain, diarrhea and vomiting.  Genitourinary: Negative for dysuria and flank pain.  Musculoskeletal: Negative for back  pain, neck pain and neck stiffness.  Skin: Negative for rash.  Neurological: Positive for weakness. Negative for headaches.       Gradual onset frontal headache today, that family indicates is similar to previous headaches when patient has sepsis/infection. No neck pain or stiffness.   Hematological: Does not bruise/bleed easily.  Psychiatric/Behavioral: Negative for confusion.     Physical Exam Updated Vital Signs BP (!) 114/48  Pulse 89   Temp 102.4 F (39.1 C) (Rectal)   Resp 21   Ht 5\' 3"  (1.6 m)   Wt 79.4 kg   LMP 01/19/1992 (Approximate)   SpO2 100%   BMI 31.00 kg/m   Physical Exam  Constitutional: She appears well-developed and well-nourished.  HENT:  Nose: Nose normal.  Mouth/Throat: Oropharynx is clear and moist.  Eyes: Conjunctivae are normal. Pupils are equal, round, and reactive to light. No scleral icterus.  Neck: Neck supple. No tracheal deviation present.  No stiffness or rigidity  Cardiovascular: Normal rate, regular rhythm, normal heart sounds and intact distal pulses.   No murmur heard. Pulmonary/Chest: Effort normal and breath sounds normal. No respiratory distress.  Right chest HD cath without sign of infection  Abdominal: Soft. Normal appearance and bowel sounds are normal. She exhibits no distension. There is no tenderness.  Genitourinary:  Genitourinary Comments: Urostomy site without purulence/gross infection  Musculoskeletal: She exhibits no edema.  Left upper arm av graft with overlying area erythema/warmth that appears c/w cellulitis (although pt/fam indicate is chronic appearance of area)  Neurological: She is alert.  Skin: Skin is warm and dry. Capillary refill takes less than 2 seconds. No rash noted.  Psychiatric: She has a normal mood and affect.  Nursing note and vitals reviewed.    ED Treatments / Results  Labs (all labs ordered are listed, but only abnormal results are displayed) Results for orders placed or performed during the  hospital encounter of 01/30/16  Comprehensive metabolic panel  Result Value Ref Range   Sodium 134 (L) 135 - 145 mmol/L   Potassium 3.2 (L) 3.5 - 5.1 mmol/L   Chloride 95 (L) 101 - 111 mmol/L   CO2 31 22 - 32 mmol/L   Glucose, Bld 113 (H) 65 - 99 mg/dL   BUN 9 6 - 20 mg/dL   Creatinine, Ser 2.81 (H) 0.44 - 1.00 mg/dL   Calcium 8.0 (L) 8.9 - 10.3 mg/dL   Total Protein 5.8 (L) 6.5 - 8.1 g/dL   Albumin 2.7 (L) 3.5 - 5.0 g/dL   AST 36 15 - 41 U/L   ALT 25 14 - 54 U/L   Alkaline Phosphatase 82 38 - 126 U/L   Total Bilirubin 0.7 0.3 - 1.2 mg/dL   GFR calc non Af Amer 16 (L) >60 mL/min   GFR calc Af Amer 18 (L) >60 mL/min   Anion gap 8 5 - 15  CBC with Differential  Result Value Ref Range   WBC 14.2 (H) 4.0 - 10.5 K/uL   RBC 3.43 (L) 3.87 - 5.11 MIL/uL   Hemoglobin 9.3 (L) 12.0 - 15.0 g/dL   HCT 30.3 (L) 36.0 - 46.0 %   MCV 88.3 78.0 - 100.0 fL   MCH 27.1 26.0 - 34.0 pg   MCHC 30.7 30.0 - 36.0 g/dL   RDW 17.9 (H) 11.5 - 15.5 %   Platelets 184 150 - 400 K/uL   Neutrophils Relative % 91 %   Neutro Abs 12.9 (H) 1.7 - 7.7 K/uL   Lymphocytes Relative 4 %   Lymphs Abs 0.5 (L) 0.7 - 4.0 K/uL   Monocytes Relative 5 %   Monocytes Absolute 0.7 0.1 - 1.0 K/uL   Eosinophils Relative 0 %   Eosinophils Absolute 0.0 0.0 - 0.7 K/uL   Basophils Relative 0 %   Basophils Absolute 0.0 0.0 - 0.1 K/uL  Protime-INR  Result Value Ref Range   Prothrombin Time 19.8 (H) 11.4 - 15.2 seconds  INR 1.66   I-Stat CG4 Lactic Acid, ED  Result Value Ref Range   Lactic Acid, Venous 0.80 0.5 - 1.9 mmol/L   Dg Chest 2 View  Result Date: 01/30/2016 CLINICAL DATA:  Nausea and weakness EXAM: CHEST  2 VIEW COMPARISON:  01/27/2016 FINDINGS: Cardiac shadow is stable. Dialysis catheter is again seen. The lungs are well aerated bilaterally. Mild early infiltrate is noted in the left lung base. No bony abnormality is seen. IMPRESSION: Early left basilar infiltrate. Electronically Signed   By: Inez Catalina M.D.   On:  01/30/2016 18:23     EKG  EKG Interpretation  Date/Time:  Saturday January 30 2016 17:33:43 EDT Ventricular Rate:  88 PR Interval:    QRS Duration: 109 QT Interval:  414 QTC Calculation: 501 R Axis:   97 Text Interpretation:  Sinus rhythm Right axis deviation Prolonged QT interval Baseline wander in lead(s) V4 Confirmed by Ashok Cordia  MD, Lennette Bihari ([id number]) on 01/30/2016 6:34:20 PM       Radiology Dg Chest 2 View  Result Date: 01/30/2016 CLINICAL DATA:  Nausea and weakness EXAM: CHEST  2 VIEW COMPARISON:  01/27/2016 FINDINGS: Cardiac shadow is stable. Dialysis catheter is again seen. The lungs are well aerated bilaterally. Mild early infiltrate is noted in the left lung base. No bony abnormality is seen. IMPRESSION: Early left basilar infiltrate. Electronically Signed   By: Inez Catalina M.D.   On: 01/30/2016 18:23    Procedures Procedures (including critical care time)  Medications Ordered in ED Medications  piperacillin-tazobactam (ZOSYN) IVPB 3.375 g (not administered)  vancomycin (VANCOCIN) IVPB 750 mg/150 ml premix (not administered)  vancomycin (VANCOCIN) IVPB 750 mg/150 ml premix (not administered)  piperacillin-tazobactam (ZOSYN) IVPB 3.375 g (not administered)  acetaminophen (TYLENOL) tablet 325 mg (325 mg Oral Given 01/30/16 1757)     Initial Impression / Assessment and Plan / ED Course  I have reviewed the triage vital signs and the nursing notes.  Pertinent labs & imaging results that were available during my care of the patient were reviewed by me and considered in my medical decision making (see chart for details).  Clinical Course   Iv ns. Continuous pulse ox and monitor. o2 [place]. Labs. Cultures.  Patient was given vancomycin prior to arrival at HD center.  Will add zosyn.  Initial lactate normal.   Medicine service consulted for admission.   ua pending. Possible infiltrate/pna on todays cxr.  No cough or sob.   Recheck pt alert, content, bp normal. Awaiting  admission.     Final Clinical Impressions(s) / ED Diagnoses   Final diagnoses:  None    New Prescriptions New Prescriptions   No medications on file        Lajean Saver, MD 01/30/16 [id number]

## 2016-01-30 NOTE — H&P (Signed)
History and Physical    DEKIYA OBEIRNE [id number] DOB: Aug 28, 1945 DOA: 01/30/2016   PCP: [name], MD Chief Complaint:  Chief Complaint  Patient presents with  . Weakness    HPI: [name] is a 70 y.o. female with medical history significant of ESRD, dialysis TTS, EF currently 60-65%.  Patient not feeling well before dialysis today.  Had dialysis today, got clammy, had chills, hypotension.  Got sent to ED.  Denies cough, had L lower chest pain a few days ago and got CXR at docs office for that but this has resolved.  No abd pain, N/V/D.  No pain over LUE fistula site, this looks erythematous but family says it always does.  No tenderness over RIJ tunneled dialysis cath.  Symptoms are persistent in ED.  Nothing makes them better or worse.  ED Course: Given zosyn / vanc in ED.  UA looks like UTI.  CXR is called as LLL infiltrate.  Review of Systems: As per HPI otherwise 10 point review of systems negative.    Past Medical History:  Diagnosis Date  . Cardiomyopathy ([place])    a. h/o LV dysfunction EF 20-25% in 2013 due to sepsis.>> improved to normal   . Chronic diastolic CHF (congestive heart failure) ([place])    10/ 2013 in setting of septic shock  . Complication of anesthesia    use a little anesthesia , per patient MD states she quit breathing (2016); hard to wake up  . ESRD (end stage renal disease) ([place])    dialysis Tues, Thurs, Sat  . GERD (gastroesophageal reflux disease)   . Glaucoma   . Glaucoma   . H/O hiatal hernia    a. CT 2017: large gastric hiatal hernia.  Marland Kitchen Headache(784.0)    migraine  . History of blood transfusion 04/13/2015   . History of echocardiogram    a. Echo 6/17: EF 60-65%, normal wall motion, mild MR, atrial septal lipomatous hypertrophy, PASP 34 mmHg, possible trivial free-flowing pericardial effusion along RV free wall // b. Echo 5/17: Mild LVH, EF 55-60%, normal wall motion, grade 1 diastolic dysfunction, trivial MR, severe LAE, mild  RAE, PASP 42 mmHg  . History of nephrostomy 04/11/2015    currently inplace 04/28/2015   . History of nuclear stress test    a. Myoview 1/14 - Marked ischemia in the basal anterior, mid anterior, apical septal and apical inferior regions, EF 63% >> LHC normal   . Hyperlipidemia   . Hypertension    medication removed from regimen due to low blood pressure   . Iron deficiency    hx  . Nephrolithiasis 2002, 2006   bilateral  . Normal coronary arteries 2014   a. LHC in 1/14: normal coronary arteries  . PAF (paroxysmal atrial fibrillation) ([place])    a. 10/ 2013  in setting of Septic Shock //  b. recurrent during admit for pneumonia, L effusion >> placed on Amiodarone // Coumadin for anticoagulation  . Pneumonia    dx 10-06-2014 per CXR--  on 10-27-2014 pt states finished antibiotic and denies cough or fever  . S/P hemodialysis catheter insertion ([place]) 04/11/2015    right anterior chest , only used once   . Sigmoid diverticulosis     Past Surgical History:  Procedure Laterality Date  . ABDOMINAL HYSTERECTOMY    . AV FISTULA PLACEMENT Left 06/02/2015   Procedure: BRACHIOCEPHALIC ARTERIOVENOUS (AV) FISTULA CREATION ;  Surgeon: Conrad Oscoda, MD;  Location: [facility];  Service: Vascular;  Laterality:  Left;  . BASCILIC VEIN TRANSPOSITION Left 07/27/2015   Procedure: FIRST STAGE BASILIC VEIN TRANSPOSITION LEFT UPPER ARM;  Surgeon: Conrad Gypsum, MD;  Location: [facility];  Service: Vascular;  Laterality: Left;  . BASCILIC VEIN TRANSPOSITION Left 09/2015   second phase  . BASCILIC VEIN TRANSPOSITION Left 10/12/2015   Procedure: SECOND STAGE BASILIC VEIN TRANSPOSITION LEFT ARM;  Surgeon: Conrad Bourneville, MD;  Location: [facility];  Service: Vascular;  Laterality: Left;  . BREAST BIOPSY Left 08/23/07   benign fibrocystic with duct ectasia  . CARDIAC CATHETERIZATION  07-11-2012  dr Irish Lack   Abnormal stress test/   normal coronary arteries/  LVEDP  23mmHg  . CARDIOVASCULAR STRESS TEST  06-26-2012  dr Irish Lack   marked  ischemia in the basal anterior, mid anterior, apical inferior regions/  normal LVF, ef 63%  . CATARACT EXTRACTION W/ INTRAOCULAR LENS  IMPLANT, BILATERAL    . CYSTOSCOPY W/ URETERAL STENT PLACEMENT  04/04/2012   Procedure: CYSTOSCOPY WITH RETROGRADE PYELOGRAM/URETERAL STENT PLACEMENT;  Surgeon: Ailene Rud, MD;  Location: [facility];  Service: Urology;  Laterality: Left;  . CYSTOSCOPY W/ URETERAL STENT PLACEMENT Bilateral 05/04/2015   Procedure: CYSTOSCOPY WITH BILATERAL RETROGRADE PYELOGRAM/ WITH INTERPRETATION, EXCHANGE OF RIGHT URETERAL STENT REPLACEMENT AND PLACEMENT LEFT URETERAL STENT PLACEMENT EXAMINATION OF VAGINA;  Surgeon: Carolan Clines, MD;  Location: [facility];  Service: Urology;  Laterality: Bilateral;  . CYSTOSCOPY WITH STENT PLACEMENT Right 10/28/2014   Procedure: RIGHT URETERAL STENT PLACEMENT;  Surgeon: Irine Seal, MD;  Location: [facility];  Service: Urology;  Laterality: Right;  . CYSTOSCOPY WITH STENT PLACEMENT Right 02/26/2015   Procedure: CYSTOSCOPY RETROGRADE PYELOGRAM WITH STENT PLACEMENT;  Surgeon: Cleon Gustin, MD;  Location: [facility];  Service: Urology;  Laterality: Right;  . CYSTOSCOPY/RETROGRADE/URETEROSCOPY/STONE EXTRACTION WITH BASKET Right 11/21/2014   Procedure: CYSTOSCOPY/RIGHT RETROGRADE PYELOGRAM/RIGHT URETEROSCOPY/BASKET EXTRACTION/RIGHT PYELOSCOPY/LASER OF STONE/RIGHT DOUBLE J STENT;  Surgeon: Carolan Clines, MD;  Location: [facility];  Service: Urology;  Laterality: Right;  . EXTRACORPOREAL SHOCK WAVE LITHOTRIPSY  05-28-2012  &  10-08-2012  . HOLMIUM LASER APPLICATION Right [id number]   Procedure: HOLMIUM LASER APPLICATION;  Surgeon: Carolan Clines, MD;  Location: [facility];  Service: Urology;  Laterality: Right;  . KNEE ARTHROSCOPY Left 02-14-2003  . LAPAROSCOPIC CHOLECYSTECTOMY  03-23-2005  . TOTAL ABDOMINAL HYSTERECTOMY W/ BILATERAL SALPINGOOPHORECTOMY  1993   secondary to fibroids  .  TRANSTHORACIC ECHOCARDIOGRAM  04-09-2012   normal LVF,  ef 60-65%,  mild LAE,  mild TR, trivial MR and PR     reports that she has never smoked. She has never used smokeless tobacco. She reports that she does not drink alcohol or use drugs.  Allergies  Allergen Reactions  . Vicodin [Hydrocodone-Acetaminophen] Nausea And Vomiting  . Chlorhexidine Rash    Sunburn    rash  . Percocet [Oxycodone-Acetaminophen] Nausea And Vomiting    Family History  Problem Relation Age of Onset  . Hypertension Mother   . Cancer Mother 50    breast  . Dementia Mother   . Hypertension Brother   . Diabetes Brother   . Heart disease Brother     before age 63  . Cancer Father 74    pancreatic  . Heart failure Paternal Grandmother   . Bladder Cancer Maternal Grandfather       Prior to Admission medications   Medication Sig Start Date End Date Taking? Authorizing Provider  warfarin (COUMADIN) 5 MG tablet Take 1 tablet (5 mg  total) by mouth daily at 6 PM. 11/23/15  Yes Modena Jansky, MD  acetaminophen (TYLENOL) 500 MG tablet Take 1,000 mg by mouth every 6 (six) hours as needed for moderate pain or headache.     Historical Provider, MD  amiodarone (PACERONE) 200 MG tablet Take 1 tablet (200 mg total) by mouth daily. 12/07/15   Liliane Shi, PA-C  bimatoprost (LUMIGAN) 0.01 % SOLN Place 1 drop into both eyes at bedtime.    Historical Provider, MD  metoprolol tartrate (LOPRESSOR) 25 MG tablet Take 1 tablet (25 mg total) by mouth 2 (two) times daily. 11/11/15   Theodis Blaze, MD  Multiple Vitamins-Minerals (MULTIVITAMIN WITH MINERALS) tablet Take 1 tablet by mouth daily.    Historical Provider, MD  Nutritional Supplements (FEEDING SUPPLEMENT, NEPRO CARB STEADY,) LIQD Take 237 mLs by mouth daily. Patient taking differently: Take 237 mLs by mouth daily as needed (nutritional supplement).  05/07/15   Maryann Mikhail, DO  ranitidine (ZANTAC) 150 MG tablet Take 150 mg by mouth 2 (two) times daily as needed for  heartburn.    Historical Provider, MD  saccharomyces boulardii (FLORASTOR) 250 MG capsule Take 1 capsule (250 mg total) by mouth 2 (two) times daily. 07/08/15   Donne Hazel, MD  sodium chloride (OCEAN) 0.65 % SOLN nasal spray Place 1 spray into both nostrils 3 (three) times daily as needed for congestion.    Historical Provider, MD  timolol (BETIMOL) 0.5 % ophthalmic solution Place 1 drop into both eyes every morning.     Historical Provider, MD    Physical Exam: Vitals:   01/30/16 1740 01/30/16 1900 01/30/16 1930 01/30/16 1945  BP:  101/60 (!) 97/51 (!) 107/51  Pulse:  91 91 93  Resp:  18 23 17   Temp: 102.4 F (39.1 C)     TempSrc: Rectal     SpO2:  98% 99% 98%  Weight:      Height:          Constitutional: NAD, calm, comfortable Eyes: PERRL, lids and conjunctivae normal ENMT: Mucous membranes are moist. Posterior pharynx clear of any exudate or lesions.Normal dentition.  Neck: normal, supple, no masses, no thyromegaly Respiratory: clear to auscultation bilaterally, no wheezing, no crackles. Normal respiratory effort. No accessory muscle use.  Cardiovascular: Regular rate and rhythm, no murmurs / rubs / gallops. No extremity edema. 2+ pedal pulses. No carotid bruits.  Abdomen: no tenderness, no masses palpated. No hepatosplenomegaly. Bowel sounds positive.  Musculoskeletal: no clubbing / cyanosis. No joint deformity upper and lower extremities. Good ROM, no contractures. Normal muscle tone.  Skin: Mild erythema about LUE dialysis fistula, mild erythema around site of RIJ cath. Neurologic: CN 2-12 grossly intact. Sensation intact, DTR normal. Strength 5/5 in all 4.  Psychiatric: Normal judgment and insight. Alert and oriented x 3. Normal mood.    Labs on Admission: I have personally reviewed following labs and imaging studies  CBC:  Recent Labs Lab 01/30/16 1736  WBC 14.2*  NEUTROABS 12.9*  HGB 9.3*  HCT 30.3*  MCV 88.3  PLT [id number]   Basic Metabolic Panel:  Recent  Labs Lab 01/30/16 1736  NA 134*  K 3.2*  CL 95*  CO2 31  GLUCOSE 113*  BUN 9  CREATININE 2.81*  CALCIUM 8.0*   GFR: Estimated Creatinine Clearance: 18.3 mL/min (by C-G formula based on SCr of 2.81 mg/dL). Liver Function Tests:  Recent Labs Lab 01/30/16 1736  AST 36  ALT 25  ALKPHOS 82  BILITOT 0.7  PROT 5.8*  ALBUMIN 2.7*   No results for input(s): LIPASE, AMYLASE in the last 168 hours. No results for input(s): AMMONIA in the last 168 hours. Coagulation Profile:  Recent Labs Lab 01/29/16 0851 01/30/16 1736  INR 1.5 1.66   Cardiac Enzymes:  Recent Labs Lab 01/30/16 1736  TROPONINI 0.03*   BNP (last 3 results) No results for input(s): PROBNP in the last 8760 hours. HbA1C: No results for input(s): HGBA1C in the last 72 hours. CBG: No results for input(s): GLUCAP in the last 168 hours. Lipid Profile: No results for input(s): CHOL, HDL, LDLCALC, TRIG, CHOLHDL, LDLDIRECT in the last 72 hours. Thyroid Function Tests: No results for input(s): TSH, T4TOTAL, FREET4, T3FREE, THYROIDAB in the last 72 hours. Anemia Panel: No results for input(s): VITAMINB12, FOLATE, FERRITIN, TIBC, IRON, RETICCTPCT in the last 72 hours. Urine analysis:    Component Value Date/Time   COLORURINE YELLOW 01/30/2016 1745   APPEARANCEUR TURBID (A) 01/30/2016 1745   LABSPEC 1.014 01/30/2016 1745   PHURINE 8.0 01/30/2016 1745   GLUCOSEU NEGATIVE 01/30/2016 1745   HGBUR MODERATE (A) 01/30/2016 1745   BILIRUBINUR NEGATIVE 01/30/2016 1745   BILIRUBINUR neg 09/18/2014 0920   KETONESUR NEGATIVE 01/30/2016 1745   PROTEINUR 100 (A) 01/30/2016 1745   UROBILINOGEN 0.2 05/02/2015 2020   NITRITE NEGATIVE 01/30/2016 1745   LEUKOCYTESUR LARGE (A) 01/30/2016 1745   Sepsis Labs: @LABRCNTIP (procalcitonin:4,lacticidven:4) )No results found for this or any previous visit (from the past 240 hour(s)).   Radiological Exams on Admission: Dg Chest 2 View  Result Date: 01/30/2016 CLINICAL DATA:   Nausea and weakness EXAM: CHEST  2 VIEW COMPARISON:  01/27/2016 FINDINGS: Cardiac shadow is stable. Dialysis catheter is again seen. The lungs are well aerated bilaterally. Mild early infiltrate is noted in the left lung base. No bony abnormality is seen. IMPRESSION: Early left basilar infiltrate. Electronically Signed   By: Inez Catalina M.D.   On: 01/30/2016 18:23    EKG: Independently reviewed.  Assessment/Plan Principal Problem:   Sepsis ([place]) Active Problems:   Essential hypertension, benign   End stage renal disease ([facility])   PAF (paroxysmal atrial fibrillation) ([facility])    Sepsis - possible sources include: UTI, LUE fistula, LLL PNA, RIJ line  Urine and blood Cx pending  Broad spectrum coverage with zosyn / vanc for now  Giving 500cc bolus of IVF for now  Serial lactates  Procalcitonin  Repeat CBC and BMP in AM  ESRD - left message on dialysis consult line  HTN - holding metoprolol  PAF -   continue amiodarone  holding metoprolol  currently in sinus rhythm, if she goes into A.Fib RVR, strategy will be to use short acting (ie cardizem gtt) due to potential for hypotension with sepsis.  Continue coumadin   DVT prophylaxis: Coumadin Code Status: Full Family Communication: No family in room Consults called: Left message on dialysis consult line Admission status: Admit to inpatient   Etta Quill DO [facility] Pager [phone] from 7PM-7AM  If 7AM-7PM, please contact the day physician for the patient [email or web address] Password [facility]  01/30/2016, 8:01 PM

## 2016-01-30 NOTE — ED Notes (Signed)
Patient transported to Ultrasound 

## 2016-01-31 LAB — MRSA PCR SCREENING: MRSA by PCR: POSITIVE — AB

## 2016-01-31 LAB — BASIC METABOLIC PANEL
Anion gap: 13 (ref 5–15)
BUN: 15 mg/dL (ref 6–20)
CO2: 27 mmol/L (ref 22–32)
Calcium: 8.6 mg/dL — ABNORMAL LOW (ref 8.9–10.3)
Chloride: 95 mmol/L — ABNORMAL LOW (ref 101–111)
Creatinine, Ser: 4.06 mg/dL — ABNORMAL HIGH (ref 0.44–1.00)
GFR calc Af Amer: 12 mL/min — ABNORMAL LOW (ref >60)
GFR calc non Af Amer: 10 mL/min — ABNORMAL LOW (ref >60)
Glucose, Bld: 94 mg/dL (ref 65–99)
Potassium: 3.5 mmol/L (ref 3.5–5.1)
Sodium: 135 mmol/L (ref 135–145)

## 2016-01-31 LAB — CBC
HCT: 34.2 % — ABNORMAL LOW (ref 36.0–46.0)
Hemoglobin: 10.2 g/dL — ABNORMAL LOW (ref 12.0–15.0)
MCH: 27.2 pg (ref 26.0–34.0)
MCHC: 29.8 g/dL — ABNORMAL LOW (ref 30.0–36.0)
MCV: 91.2 fL (ref 78.0–100.0)
Platelets: 175 10*3/uL (ref 150–400)
RBC: 3.75 MIL/uL — ABNORMAL LOW (ref 3.87–5.11)
RDW: 18.3 % — ABNORMAL HIGH (ref 11.5–15.5)
WBC: 10.1 10*3/uL (ref 4.0–10.5)

## 2016-01-31 LAB — PROTIME-INR
INR: 1.64
Prothrombin Time: 19.6 seconds — ABNORMAL HIGH (ref 11.4–15.2)

## 2016-01-31 MED ORDER — CHLORHEXIDINE GLUCONATE CLOTH 2 % EX PADS: 6.0000 | MEDICATED_PAD | Freq: Every day | CUTANEOUS | Status: DC

## 2016-01-31 MED ORDER — MUPIROCIN 2 % EX OINT
1.0000 "application " | TOPICAL_OINTMENT | Freq: Two times a day (BID) | CUTANEOUS | Status: DC
  Administered 2016-02-01 – 2016-02-03 (×5): 1 via NASAL
  Filled 2016-01-31 (×3): qty 22

## 2016-01-31 MED ORDER — SODIUM CHLORIDE 0.9 % IV BOLUS (SEPSIS)
500.0000 mL | Freq: Once | INTRAVENOUS | Status: AC
  Administered 2016-01-31: 500 mL via INTRAVENOUS

## 2016-01-31 MED ORDER — WARFARIN SODIUM 2 MG PO TABS
4.0000 mg | ORAL_TABLET | Freq: Once | ORAL | Status: AC
  Administered 2016-01-31: 4 mg via ORAL
  Filled 2016-01-31: qty 1
  Filled 2016-01-31: qty 2

## 2016-01-31 MED ORDER — FAMOTIDINE 20 MG PO TABS
20.0000 mg | ORAL_TABLET | Freq: Every day | ORAL | Status: DC
  Administered 2016-02-01 – 2016-02-03 (×3): 20 mg via ORAL
  Filled 2016-01-31 (×3): qty 1

## 2016-01-31 NOTE — Progress Notes (Signed)
[facility] PROGRESS NOTE  [name] [id number] DOB: [birth year] DOA: 01/30/2016 PCP: [name], MD  Assessment/Plan: Sepsis - possible sources include: UTI, LUE fistula, LLL PNA, RIJ line                       Urine and blood Cx pending                       Broad spectrum coverage with zosyn / vanc for now                       Giving 500cc bolus of IVF for now again                       Serial lactates good                       Procalcitonin elevated 0.49                       Repeat CBC and BMP in AM   Doing well resp wise  ESRD - left message on dialysis consult line, T-TH- Sat sch - last complete HD on Saturday  HTN - holding metoprolol  PAF -                        continue amiodarone                       holding metoprolol                       currently in sinus rhythm, if she goes into A.Fib RVR, strategy will be to use short acting (ie cardizem gtt) due to potential for hypotension with sepsis.                       Continue coumadin   DVT prophylaxis: Coumadin Code Status: Full Family Communication: No family in room Consults called: Admitter - Left message on dialysis consult line Admission status: Admit to inpatient   Consultants:  Nephro  Procedures:  none  Antibiotics:  Vanc/Zosyn 8/12-->P (indicate start date, and stop date if known)  HPI/Subjective: No events on tele. No events per nursing. Denies CP. No SOB.   Objective: Vitals:   01/31/16 1800 01/31/16 2000  BP: (!) 88/59 (!) 94/59  Pulse: 94 92  Resp: 16 (!) 22  Temp: 99.4 F (37.4 C) 98.6 F (37 C)    Intake/Output Summary (Last 24 hours) at 01/31/16 2203 Last data filed at 01/31/16 1900  Gross per 24 hour  Intake              530 ml  Output              220 ml  Net              310 ml   Filed Weights   01/30/16 1650 01/31/16 1445  Weight: 79.4 kg (175 lb) 79.1 kg (174 lb 6.1 oz)    Exam:  General:  No diaphoresis, anxious, no acute  distress Cardiovascular: Regular rate and rhythm no murmurs rubs or gallops Respiratory: Clear to auscultation bilaterally no more breathing Abdomen: Nondistended bowel sounds normal nontender palpation Musculoskeletal: Moving all extremities, no deformity, 5 out of 5 strength   Skin: Immature  fistual with burit L arm, nephrostomy in R back, HD access in R ches  Data Reviewed: Basic Metabolic Panel:  Recent Labs Lab 01/30/16 1736 01/31/16 0452  NA 134* 135  K 3.2* 3.5  CL 95* 95*  CO2 31 27  GLUCOSE 113* 94  BUN 9 15  CREATININE 2.81* 4.06*  CALCIUM 8.0* 8.6*   Liver Function Tests:  Recent Labs Lab 01/30/16 1736  AST 36  ALT 25  ALKPHOS 82  BILITOT 0.7  PROT 5.8*  ALBUMIN 2.7*   No results for input(s): LIPASE, AMYLASE in the last 168 hours. No results for input(s): AMMONIA in the last 168 hours. CBC:  Recent Labs Lab 01/30/16 1736 01/31/16 0452  WBC 14.2* 10.1  NEUTROABS 12.9*  --   HGB 9.3* 10.2*  HCT 30.3* 34.2*  MCV 88.3 91.2  PLT 184 175   Cardiac Enzymes:  Recent Labs Lab 01/30/16 1736  TROPONINI 0.03*   BNP (last 3 results)  Recent Labs  02/26/15 0530 05/23/15 0602 07/04/15 1014  BNP 35.3 37.2 183.0*    ProBNP (last 3 results) No results for input(s): PROBNP in the last 8760 hours.  CBG: No results for input(s): GLUCAP in the last 168 hours.  Recent Results (from the past 240 hour(s))  Culture, blood (Routine x 2)     Status: None (Preliminary result)   Collection Time: 01/30/16  5:18 PM  Result Value Ref Range Status   Specimen Description RIGHT ANTECUBITAL  Final   Special Requests IN PEDIATRIC BOTTLE 3CC  Final   Culture NO GROWTH < 24 HOURS  Final   Report Status PENDING  Incomplete  Culture, blood (Routine x 2)     Status: None (Preliminary result)   Collection Time: 01/30/16  5:28 PM  Result Value Ref Range Status   Specimen Description BLOOD RIGHT HAND  Final   Special Requests BOTTLES DRAWN AEROBIC AND ANAEROBIC 5CC   Final   Culture NO GROWTH < 24 HOURS  Final   Report Status PENDING  Incomplete  Urine culture     Status: None (Preliminary result)   Collection Time: 01/30/16  5:45 PM  Result Value Ref Range Status   Specimen Description URINE, CATHETERIZED  Final   Special Requests NONE  Final   Culture CULTURE REINCUBATED FOR BETTER GROWTH  Final   Report Status PENDING  Incomplete  MRSA PCR Screening     Status: Abnormal   Collection Time: 01/31/16  2:29 PM  Result Value Ref Range Status   MRSA by PCR POSITIVE (A) NEGATIVE Final    Comment:        The GeneXpert MRSA Assay (FDA approved for NASAL specimens only), is one component of a comprehensive MRSA colonization surveillance program. It is not intended to diagnose MRSA infection nor to guide or monitor treatment for MRSA infections. RESULT CALLED TO, READ BACK BY AND VERIFIED WITH: S.ADAMS RN AT 1652 01/31/16 BY A.DAVIS      Studies: Dg Chest 2 View  Result Date: 01/30/2016 CLINICAL DATA:  Nausea and weakness EXAM: CHEST  2 VIEW COMPARISON:  01/27/2016 FINDINGS: Cardiac shadow is stable. Dialysis catheter is again seen. The lungs are well aerated bilaterally. Mild early infiltrate is noted in the left lung base. No bony abnormality is seen. IMPRESSION: Early left basilar infiltrate. Electronically Signed   By: Inez Catalina M.D.   On: 01/30/2016 18:23   [name] Extrem Up Left Ltd  Result Date: 01/30/2016 CLINICAL DATA:  Soft tissue swelling at dialysis fistula  site, check for possible abscess EXAM: ULTRASOUND left UPPER EXTREMITY LIMITED TECHNIQUE: Ultrasound examination of the upper extremity soft tissues was performed in the area of clinical concern. COMPARISON:  None FINDINGS: No fluid collection is identified to suggest focal abscess. The patient's known fistula is patent. No significant edema is noted. IMPRESSION: No evidence of focal abscess. Electronically Signed   By: Inez Catalina M.D.   On: 01/30/2016 20:25    Scheduled Meds: .  amiodarone  200 mg Oral Daily  . [START ON 02/01/2016] famotidine  20 mg Oral Daily  . latanoprost  1 drop Both Eyes QHS  . multivitamin with minerals  1 tablet Oral Daily  . piperacillin-tazobactam (ZOSYN)  IV  3.375 g Intravenous Q12H  . saccharomyces boulardii  250 mg Oral BID  . sodium chloride  500 mL Intravenous Once  . timolol  1 drop Both Eyes q morning - 10a  . [START ON 02/02/2016] vancomycin  750 mg Intravenous Q T,Th,Sa-HD  . Warfarin - Pharmacist Dosing Inpatient   Does not apply q1800   Continuous Infusions:   Principal Problem:   Sepsis ([place]) Active Problems:   Essential hypertension, benign   End stage renal disease (H[facility])   PAF (paroxysmal atrial fibrillation) ([place])    Time spent: [facility] Pager [place]. If 7PM-7AM, please contact night-coverage at [email or web address], password [email or web address] 01/31/2016, 10:03 PM  LOS: 1 day

## 2016-01-31 NOTE — Progress Notes (Signed)
Patient has elevated temperature of 103. Tylenol has been given per PRN orders. Patient is also wrapped up in several blankets and the room temperature is rather warm which may be contributing to the elevation she is experiencing.

## 2016-01-31 NOTE — ED Notes (Signed)
Ordered renal diet tray 

## 2016-01-31 NOTE — Progress Notes (Signed)
ANTICOAGULATION CONSULT NOTE - FOLLOW UP  Pharmacy Consult:  Coumadin Indication: atrial fibrillation  Allergies  Allergen Reactions  . Vicodin [Hydrocodone-Acetaminophen] Nausea And Vomiting  . Chlorhexidine Rash    Sunburn    rash  . Percocet [Oxycodone-Acetaminophen] Nausea And Vomiting    Patient Measurements: Height: 5\' 3"  (160 cm) Weight: 175 lb (79.4 kg) IBW/kg (Calculated) : 52.4   Vital Signs: Temp: 99.5 F (37.5 C) (08/13 1040) Temp Source: Oral (08/13 1040) BP: 101/46 (08/13 1200) Pulse Rate: 78 (08/13 1200)  Labs:  Recent Labs  01/29/16 0851 01/30/16 1736 01/31/16 0452  HGB  --  9.3* 10.2*  HCT  --  30.3* 34.2*  PLT  --  184 175  LABPROT  --  19.8* 19.6*  INR 1.5 1.66 1.64  CREATININE  --  2.81* 4.06*  TROPONINI  --  0.03*  --     Estimated Creatinine Clearance: 12.7 mL/min (by C-G formula based on SCr of 4.06 mg/dL).   Assessment: 39 YOF with history of AFib to continue on Coumadin.  INR sub-therapeutic; no bleeding reported.  Home Coumadin dose: 2.5mg  daily except 5mg  on Mon/Thurs   Goal of Therapy:  INR 2-3    Plan:  - Coumadin 4mg  PO today - Daily PT / INR   Yamil Dougher D. Mina Marble, PharmD, BCPS Pager:  [phone] 01/31/2016, 1:16 PM

## 2016-01-31 NOTE — ED Notes (Signed)
IV team at bedside 

## 2016-01-31 NOTE — ED Notes (Signed)
Pt sleeping. 

## 2016-01-31 NOTE — Consult Note (Signed)
Renal Service Consult Note [facility]  [name] 01/31/2016 [name] Requesting Physician:  Dr Aggie Moats  Reason for Consult:  ESRD pt with fevers HPI: The patient is a 70 y.o. year-old with hx of recurrent UTI/ R hydro w ureteral stent and PCN, hx Cdif, ESRD started HD in May of this year. Has an HD cath and a maturing LUA AVF which is not ready yet for use per pt.  She presented after HD yest with chills, sweats and rigors. BP''s dropped some.  No abd pain, n/v/d no joint pain or HA.  Admitted and started on IV abx vanc/ zosyn.  Reportedly blood cx's were drawn at HD on 8/12, I called [facility] and they don't have anything there yet w her name on it.  Blood cx's done here are negative today.  Reportedly she did get abx at HD yest, but not able to corroborate that today.  Patient feels " rotten", no appetitie, warm, chills off and on.  No n/v/d or abd pain, no prod cough. CXR shows possible L basilar retrocardiac infiltrate, vs old scarring.  She was here in June w chest pain and L effusion, had L thora with transudate returned.  Denies sig SOB or cough or hemoptysis. Will see for HD.     Admits: 2016 May - septic/ acute pyelo, ureteral stent 5/10 per urol, Cdif, a/c renal, afib, urine cx+yeast Sept - septic/ UTI/ pyelo, a/c renal, diast HF Oct - a/c renal close to needing HD, R stent and R perc per urol, CKD 4/5 Nov - sepsis/ pyelo, mult species on Ucx, dc augmentin low dose w Cdif hx per urol Dec - septic shock/ pyelo/ UTI/ persist R hydro, staph in urine rx po abx 2017 Jan - septic after perc neph tube change, blood/ urine cx's neg, dc po abx Feb - septic, pseudomonas UTI, R perc in place, a/c renal Apr - septic / UTI cx neg rx'd abx Apr - 2nd stage BVT May - sepsis/ UTI w pseudomonas rx with Fortax at HD, new ESRD, maturing AVF Jun - chest pain w/u neg except L PNA/ effusion by CT, IV abx, L thora transudate, PAF   ROS  denies CP  no joint pain   no HA  no blurry vision  no rash  no diarrhea  no nausea/ vomiting  no dysuria  no difficulty voiding  no change in urine color    Past Medical History  Past Medical History:  Diagnosis Date  . Cardiomyopathy ([place])    a. h/o LV dysfunction EF 20-25% in 2013 due to sepsis.>> improved to normal   . Chronic diastolic CHF (congestive heart failure) ([place])    10/ 2013 in setting of septic shock  . Complication of anesthesia    use a little anesthesia , per patient MD states she quit breathing (2016); hard to wake up  . ESRD (end stage renal disease) ([place])    dialysis Tues, Thurs, Sat  . GERD (gastroesophageal reflux disease)   . Glaucoma   . Glaucoma   . H/O hiatal hernia    a. CT 2017: large gastric hiatal hernia.  Marland Kitchen Headache(784.0)    migraine  . History of blood transfusion 04/13/2015   . History of echocardiogram    a. Echo 6/17: EF 60-65%, normal wall motion, mild MR, atrial septal lipomatous hypertrophy, PASP 34 mmHg, possible trivial free-flowing pericardial effusion along RV free wall // b. Echo 5/17: Mild LVH, EF 55-60%, normal wall motion, grade 1  diastolic dysfunction, trivial MR, severe LAE, mild RAE, PASP 42 mmHg  . History of nephrostomy 04/11/2015    currently inplace 04/28/2015   . History of nuclear stress test    a. Myoview 1/14 - Marked ischemia in the basal anterior, mid anterior, apical septal and apical inferior regions, EF 63% >> LHC normal   . Hyperlipidemia   . Hypertension    medication removed from regimen due to low blood pressure   . Iron deficiency    hx  . Nephrolithiasis 2002, 2006   bilateral  . Normal coronary arteries 2014   a. LHC in 1/14: normal coronary arteries  . PAF (paroxysmal atrial fibrillation) ([place])    a. 10/ 2013  in setting of Septic Shock //  b. recurrent during admit for pneumonia, L effusion >> placed on Amiodarone // Coumadin for anticoagulation  . Pneumonia    dx 10-06-2014 per CXR--  on 10-27-2014 pt states finished antibiotic and  denies cough or fever  . S/P hemodialysis catheter insertion ([place]) 04/11/2015    right anterior chest , only used once   . Sigmoid diverticulosis    Past Surgical History  Past Surgical History:  Procedure Laterality Date  . ABDOMINAL HYSTERECTOMY    . AV FISTULA PLACEMENT Left 06/02/2015   Procedure: BRACHIOCEPHALIC ARTERIOVENOUS (AV) FISTULA CREATION ;  Surgeon: Conrad Bensley, MD;  Location: [place];  Service: Vascular;  Laterality: Left;  . BASCILIC VEIN TRANSPOSITION Left 07/27/2015   Procedure: FIRST STAGE BASILIC VEIN TRANSPOSITION LEFT UPPER ARM;  Surgeon: Conrad Minco, MD;  Location: [place];  Service: Vascular;  Laterality: Left;  . BASCILIC VEIN TRANSPOSITION Left 09/2015   second phase  . BASCILIC VEIN TRANSPOSITION Left 10/12/2015   Procedure: SECOND STAGE BASILIC VEIN TRANSPOSITION LEFT ARM;  Surgeon: Conrad Vista West, MD;  Location: [place];  Service: Vascular;  Laterality: Left;  . BREAST BIOPSY Left 08/23/07   benign fibrocystic with duct ectasia  . CARDIAC CATHETERIZATION  07-11-2012  dr Irish Lack   Abnormal stress test/   normal coronary arteries/  LVEDP  59mmHg  . CARDIOVASCULAR STRESS TEST  06-26-2012  dr Irish Lack   marked ischemia in the basal anterior, mid anterior, apical inferior regions/  normal LVF, ef 63%  . CATARACT EXTRACTION W/ INTRAOCULAR LENS  IMPLANT, BILATERAL    . CYSTOSCOPY W/ URETERAL STENT PLACEMENT  04/04/2012   Procedure: CYSTOSCOPY WITH RETROGRADE PYELOGRAM/URETERAL STENT PLACEMENT;  Surgeon: Ailene Rud, MD;  Location: [place];  Service: Urology;  Laterality: Left;  . CYSTOSCOPY W/ URETERAL STENT PLACEMENT Bilateral 05/04/2015   Procedure: CYSTOSCOPY WITH BILATERAL RETROGRADE PYELOGRAM/ WITH INTERPRETATION, EXCHANGE OF RIGHT URETERAL STENT REPLACEMENT AND PLACEMENT LEFT URETERAL STENT PLACEMENT EXAMINATION OF VAGINA;  Surgeon: Carolan Clines, MD;  Location: [facility];  Service: Urology;  Laterality: Bilateral;  . CYSTOSCOPY WITH STENT PLACEMENT Right  10/28/2014   Procedure: RIGHT URETERAL STENT PLACEMENT;  Surgeon: Irine Seal, MD;  Location[facility];  Service: Urology;  Laterality: Right;  . CYSTOSCOPY WITH STENT PLACEMENT Right 02/26/2015   Procedure: CYSTOSCOPY RETROGRADE PYELOGRAM WITH STENT PLACEMENT;  Surgeon: Cleon Gustin, MD;  Location: [facility];  Service: Urology;  Laterality: Right;  . CYSTOSCOPY/RETROGRADE/URETEROSCOPY/STONE EXTRACTION WITH BASKET Right 11/21/2014   Procedure: CYSTOSCOPY/RIGHT RETROGRADE PYELOGRAM/RIGHT URETEROSCOPY/BASKET EXTRACTION/RIGHT PYELOSCOPY/LASER OF STONE/RIGHT DOUBLE J STENT;  Surgeon: Carolan Clines, MD;  Location: [place];  Service: Urology;  Laterality: Right;  . EXTRACORPOREAL SHOCK WAVE LITHOTRIPSY  05-28-2012  &  10-08-2012  .  HOLMIUM LASER APPLICATION Right [id number]   Procedure: HOLMIUM LASER APPLICATION;  Surgeon: Carolan Clines, MD;  Location: [facility];  Service: Urology;  Laterality: Right;  . KNEE ARTHROSCOPY Left 02-14-2003  . LAPAROSCOPIC CHOLECYSTECTOMY  03-23-2005  . TOTAL ABDOMINAL HYSTERECTOMY W/ BILATERAL SALPINGOOPHORECTOMY  1993   secondary to fibroids  . TRANSTHORACIC ECHOCARDIOGRAM  04-09-2012   normal LVF,  ef 60-65%,  mild LAE,  mild TR, trivial MR and PR   Family History  Family History  Problem Relation Age of Onset  . Hypertension Mother   . Cancer Mother 6    breast  . Dementia Mother   . Hypertension Brother   . Diabetes Brother   . Heart disease Brother     before age 74  . Cancer Father 73    pancreatic  . Heart failure Paternal Grandmother   . Bladder Cancer Maternal Grandfather    Social History  reports that she has never smoked. She has never used smokeless tobacco. She reports that she does not drink alcohol or use drugs. Allergies  Allergies  Allergen Reactions  . Vicodin [Hydrocodone-Acetaminophen] Nausea And Vomiting  . Chlorhexidine Rash    Sunburn    rash  . Percocet  [Oxycodone-Acetaminophen] Nausea And Vomiting   Home medications Prior to Admission medications   Medication Sig Start Date End Date Taking? Authorizing Provider  acetaminophen (TYLENOL) 500 MG tablet Take 1,000 mg by mouth every 6 (six) hours as needed for moderate pain or headache.    Yes Historical Provider, MD  amiodarone (PACERONE) 200 MG tablet Take 1 tablet (200 mg total) by mouth daily. 12/07/15  Yes Scott T Weaver, PA-C  bimatoprost (LUMIGAN) 0.01 % SOLN Place 1 drop into both eyes at bedtime.   Yes Historical Provider, MD  calcium acetate (PHOSLO) 667 MG capsule Take 667 mg by mouth 3 (three) times daily with meals.   Yes Historical Provider, MD  metoprolol tartrate (LOPRESSOR) 25 MG tablet Take 1 tablet (25 mg total) by mouth 2 (two) times daily. Patient taking differently: Take 12.5 mg by mouth 2 (two) times daily.  11/11/15  Yes Theodis Blaze, MD  Multiple Vitamins-Minerals (MULTIVITAMIN WITH MINERALS) tablet Take 1 tablet by mouth daily.   Yes Historical Provider, MD  Nutritional Supplements (FEEDING SUPPLEMENT, NEPRO CARB STEADY,) LIQD Take 237 mLs by mouth daily. Patient taking differently: Take 237 mLs by mouth daily as needed (nutritional supplement).  05/07/15  Yes Maryann Mikhail, DO  ranitidine (ZANTAC) 150 MG tablet Take 150 mg by mouth 2 (two) times daily as needed for heartburn.   Yes Historical Provider, MD  saccharomyces boulardii (FLORASTOR) 250 MG capsule Take 1 capsule (250 mg total) by mouth 2 (two) times daily. 07/08/15  Yes Donne Hazel, MD  sodium chloride (OCEAN) 0.65 % SOLN nasal spray Place 1 spray into both nostrils 3 (three) times daily as needed for congestion.   Yes Historical Provider, MD  timolol (BETIMOL) 0.5 % ophthalmic solution Place 1 drop into both eyes every morning.    Yes Historical Provider, MD  warfarin (COUMADIN) 5 MG tablet Take 2.5-5 mg by mouth daily. Take 2.5mg  every day except on Monday and Thursdays take 5mg  tablet per patient    Yes  Historical Provider, MD  warfarin (COUMADIN) 5 MG tablet Take 1 tablet (5 mg total) by mouth daily at 6 PM. Patient not taking: Reported on 01/30/2016 11/23/15   Modena Jansky, MD   Liver Function Tests  Recent Labs Lab 01/30/16  1736  AST 36  ALT 25  ALKPHOS 82  BILITOT 0.7  PROT 5.8*  ALBUMIN 2.7*   No results for input(s): LIPASE, AMYLASE in the last 168 hours. CBC  Recent Labs Lab 01/30/16 1736 01/31/16 0452  WBC 14.2* 10.1  NEUTROABS 12.9*  --   HGB 9.3* 10.2*  HCT 30.3* 34.2*  MCV 88.3 91.2  PLT 184 0000000   Basic Metabolic Panel  Recent Labs Lab 01/30/16 1736 01/31/16 0452  NA 134* 135  K 3.2* 3.5  CL 95* 95*  CO2 31 27  GLUCOSE 113* 94  BUN 9 15  CREATININE 2.81* 4.06*  CALCIUM 8.0* 8.6*   Iron/TIBC/Ferritin/ %Sat    Component Value Date/Time   IRON 41 10/29/2015 1000   TIBC 175 (L) 10/29/2015 1000   FERRITIN 312 (H) 10/29/2015 1000   IRONPCTSAT 23 10/29/2015 1000    Vitals:   01/31/16 0500 01/31/16 0545 01/31/16 0615 01/31/16 0645  BP: (!) 103/54 (!) 97/42 (!) 103/53 (!) 106/43  Pulse: 78 80 79 84  Resp: 22 (!) 32 (!) 27 16  Temp:      TempSrc:      SpO2: 95% 99% 93% 99%  Weight:      Height:       Exam Gen dark glasses, no distress, chron ill appearing No rash, cyanosis or gangrene Sclera anicteric, throat clear  No jvd or bruits Chest clear bilat RRR no MRG Abd soft ntnd no mass or ascites +bs +obese GU - has R flank perc neph, clean exit site, no urine in bag MS no joint effusions or deformit Ext no LE edema / no wounds or ulcers Neuro is alert, Ox 3 , nf LUA AVF +bruit, moderately mature; R IJ cath clean exit site  ECHO LVEF 60% on Jun 2017 study  Dialysis: TTS [facility]  4h 50min  79kg  2/2 bath  Hep 2200  LUA AVF (Feb/ Apr 2017 #1/#2 stage BVT) / R IJ TDC Venofer 50/wk, no esa, no binder Calc 0.5 ug Comp- good BP - normal Getting to EDW - yes   Assessment: 1.  Fevers - high fevers, suspect UTI.  Blood cx's neg here , but  need to f/u blood cx's from OP HD on Sat 0000000 to be certain that she doesn't have a catheter infection.   2.  Chronic R hydro - internal JJ and perc neph in place, changing out on schedule 3.  ESRD started HD about 3-4 mos ago, using IJ cath, AVF almost ready 4.  Vol is at dry wt, no vol issues 5.  Afib - on amio, BB   Plan - f/u OP cultures tomorrow, keep HD cath in for now, f/u urine/ perc neph cx's too, Abx  Kelly Splinter MD [place] pager [phone]    cell [phone] 01/31/2016, 9:17 AM

## 2016-01-31 NOTE — ED Notes (Signed)
Lunch tray ordered 

## 2016-01-31 NOTE — ED Notes (Signed)
Attempted report 

## 2016-01-31 NOTE — ED Notes (Signed)
Placed on hospital bed for comfort

## 2016-02-01 ENCOUNTER — Ambulatory Visit (HOSPITAL_COMMUNITY): Payer: Medicare Other

## 2016-02-01 ENCOUNTER — Encounter (HOSPITAL_COMMUNITY): Payer: Self-pay | Admitting: Interventional Radiology

## 2016-02-01 ENCOUNTER — Inpatient Hospital Stay (HOSPITAL_COMMUNITY): Payer: Medicare Other

## 2016-02-01 ENCOUNTER — Inpatient Hospital Stay (HOSPITAL_COMMUNITY): Admission: RE | Admit: 2016-02-01 | Payer: Medicare Other | Source: Ambulatory Visit

## 2016-02-01 HISTORY — PX: IR GENERIC HISTORICAL: IMG1180011

## 2016-02-01 LAB — CBC WITH DIFFERENTIAL/PLATELET
Basophils Absolute: 0 10*3/uL (ref 0.0–0.1)
Basophils Relative: 0 %
Eosinophils Absolute: 0 10*3/uL (ref 0.0–0.7)
Eosinophils Relative: 0 %
HCT: 28.6 % — ABNORMAL LOW (ref 36.0–46.0)
Hemoglobin: 8.7 g/dL — ABNORMAL LOW (ref 12.0–15.0)
Lymphocytes Relative: 8 %
Lymphs Abs: 0.6 10*3/uL — ABNORMAL LOW (ref 0.7–4.0)
MCH: 26.8 pg (ref 26.0–34.0)
MCHC: 30.4 g/dL (ref 30.0–36.0)
MCV: 88 fL (ref 78.0–100.0)
Monocytes Absolute: 0.3 10*3/uL (ref 0.1–1.0)
Monocytes Relative: 4 %
Neutro Abs: 7.2 10*3/uL (ref 1.7–7.7)
Neutrophils Relative %: 88 %
Platelets: 167 10*3/uL (ref 150–400)
RBC: 3.25 MIL/uL — ABNORMAL LOW (ref 3.87–5.11)
RDW: 18.2 % — ABNORMAL HIGH (ref 11.5–15.5)
WBC: 8.2 10*3/uL (ref 4.0–10.5)

## 2016-02-01 LAB — RENAL FUNCTION PANEL
Albumin: 2.3 g/dL — ABNORMAL LOW (ref 3.5–5.0)
Anion gap: 11 (ref 5–15)
BUN: 25 mg/dL — ABNORMAL HIGH (ref 6–20)
CO2: 26 mmol/L (ref 22–32)
Calcium: 8.1 mg/dL — ABNORMAL LOW (ref 8.9–10.3)
Chloride: 98 mmol/L — ABNORMAL LOW (ref 101–111)
Creatinine, Ser: 5.83 mg/dL — ABNORMAL HIGH (ref 0.44–1.00)
GFR calc Af Amer: 8 mL/min — ABNORMAL LOW (ref >60)
GFR calc non Af Amer: 7 mL/min — ABNORMAL LOW (ref >60)
Glucose, Bld: 95 mg/dL (ref 65–99)
Phosphorus: 3.2 mg/dL (ref 2.5–4.6)
Potassium: 3.4 mmol/L — ABNORMAL LOW (ref 3.5–5.1)
Sodium: 135 mmol/L (ref 135–145)

## 2016-02-01 LAB — URINE CULTURE: Culture: 20000 — AB

## 2016-02-01 LAB — PROTIME-INR
INR: 1.88
Prothrombin Time: 21.8 seconds — ABNORMAL HIGH (ref 11.4–15.2)

## 2016-02-01 MED ORDER — SODIUM CHLORIDE 0.9 % IV SOLN: 100.0000 mL | INTRAVENOUS | Status: DC | PRN

## 2016-02-01 MED ORDER — ALTEPLASE 2 MG IJ SOLR: 2.0000 mg | Freq: Once | INTRAMUSCULAR | Status: DC | PRN

## 2016-02-01 MED ORDER — HEPARIN SODIUM (PORCINE) 1000 UNIT/ML DIALYSIS
20.0000 [IU]/kg | INTRAMUSCULAR | Status: DC | PRN
Start: 2016-02-01 — End: 2016-02-02

## 2016-02-01 MED ORDER — HEPARIN SODIUM (PORCINE) 1000 UNIT/ML DIALYSIS: 1000.0000 [IU] | INTRAMUSCULAR | Status: DC | PRN

## 2016-02-01 MED ORDER — IOPAMIDOL (ISOVUE-300) INJECTION 61%
INTRAVENOUS | Status: AC
  Administered 2016-02-01: 5 mL
  Filled 2016-02-01: qty 50

## 2016-02-01 MED ORDER — LIDOCAINE HCL 1 % IJ SOLN
INTRAMUSCULAR | Status: AC
  Administered 2016-02-01: 5 mL
  Filled 2016-02-01: qty 20

## 2016-02-01 MED ORDER — WARFARIN SODIUM 5 MG PO TABS
5.0000 mg | ORAL_TABLET | Freq: Once | ORAL | Status: AC
  Administered 2016-02-01: 5 mg via ORAL
  Filled 2016-02-01: qty 1

## 2016-02-01 NOTE — Progress Notes (Signed)
Report called to [facility]. No questions or concerns. Transporting patient within the next few minutes.

## 2016-02-01 NOTE — Progress Notes (Signed)
Admit: 01/30/2016 LOS: 2  6F ESRD THS [facility] longstanding urological issues admit with fever, chills, malaise.    Subjective:  Reports feeling improved this AM on Vanc/Zosyn  No recent fevers No Cx data back from kidney center yet  08/13 0701 - 08/14 0700 In: 1250 [P.O.:600; I.V.:50; IV Piggyback:600] Out: 220 [Urine:220]  Filed Weights   01/30/16 1650 01/31/16 1445  Weight: 79.4 kg (175 lb) 79.1 kg (174 lb 6.1 oz)    Scheduled Meds: . amiodarone  200 mg Oral Daily  . famotidine  20 mg Oral Daily  . latanoprost  1 drop Both Eyes QHS  . multivitamin with minerals  1 tablet Oral Daily  . mupirocin ointment  1 application Nasal BID  . piperacillin-tazobactam (ZOSYN)  IV  3.375 g Intravenous Q12H  . saccharomyces boulardii  250 mg Oral BID  . timolol  1 drop Both Eyes q morning - 10a  . [START ON 02/02/2016] vancomycin  750 mg Intravenous Q T,Th,Sa-HD  . Warfarin - Pharmacist Dosing Inpatient   Does not apply q1800   Continuous Infusions:  PRN Meds:.acetaminophen, feeding supplement (NEPRO CARB STEADY), sodium chloride  Current Labs: reviewed  8/12 UCx pending 8/12 B Cx x2 pending   Physical Exam:  Blood pressure 113/63, pulse 92, temperature 98.4 F (36.9 C), temperature source Oral, resp. rate 18, height 5\' 3"  (1.6 m), weight 79.1 kg (174 lb 6.1 oz), last menstrual period 01/19/1992, SpO2 100 %. no distress, chron ill appearing No rash, cyanosis or gangrene Sclera anicteric, throat clear  No jvd or bruits Chest clear bilat RRR no MRG Abd soft ntnd no mass or ascites +bs +obese GU - has R flank perc neph, clean exit site, no urine in bag MS no joint effusions or deformit Ext no LE edema / no wounds or ulcers Neuro is alert, Ox 3 , nf LUA AVF +bruit, moderately mature; R IJ cath clean exit site  Dialysis: TTS [facility]  4h 68min  79kg  2/2 bath  Hep 2200  LUA AVF (Feb/ Apr 2017 #1/#2 stage BVT) / R IJ TDC Venofer 50/wk, no esa, no binder Calc 0.5 ug Comp- good BP -  normal Getting to EDW - yes   A 1. Fevers suspected UTI, Cx data U + B NGTD; rec Vanc at end of Tx 8/12 2. Chronic R Hydro, chronic PCN + ureteral stent 3. ESRD TH[facility], using TDC, maturing AVF 4. AFib  5. Leukocytosis, resolved  P 1. Cont to f/u outpt / inpt  B Cx 2. HD tomorrow   Pearson Grippe MD 02/01/2016, 9:02 AM   Recent Labs Lab 01/30/16 1736 01/31/16 0452 02/01/16 0345  NA 134* 135 135  K 3.2* 3.5 3.4*  CL 95* 95* 98*  CO2 31 27 26   GLUCOSE 113* 94 95  BUN 9 15 25*  CREATININE 2.81* 4.06* 5.83*  CALCIUM 8.0* 8.6* 8.1*  PHOS  --   --  3.2    Recent Labs Lab 01/30/16 1736 01/31/16 0452 02/01/16 0345  WBC 14.2* 10.1 8.2  NEUTROABS 12.9*  --  7.2  HGB 9.3* 10.2* 8.7*  HCT 30.3* 34.2* 28.6*  MCV 88.3 91.2 88.0  PLT 184 175 167

## 2016-02-01 NOTE — Progress Notes (Signed)
PROGRESS NOTE    [name]  [id number] DOB: [birth year] DOA: 01/30/2016 PCP: [name], MD    Brief Narrative:  [name] is a 70 y.o. female with medical history significant of ESRD, dialysis TTS, presents with sepsis with hypotension and possible left sided pneumonia.   Assessment & Plan:   Principal Problem:   Sepsis ([place]) Active Problems:   Essential hypertension, benign   End stage renal disease (H[facility])   PAF (paroxysmal atrial fibrillation) ([facility])   Sepsis from possible left lower lobe pneumonia, questionable UTI : Urine cultures show 20,000 proteus, pan sensitive.  Blood cultures are pending.  On RA with good oxygen sats.  Resume IV antibiotics to complete the course.  Hypotension resolved.  Normal lactic acid.  Negative procalcitonin.   Hypertension: bp parameters have been borderline, and have improved.    ESRD on HD: - HD as per renal.   Anemia of chronic disease: monitor.   Leukocytosis resolved.   Mild elevation of troponin: probably demand ischemia from sepsis.  No chest pain.   PAF: In sinus,on amiodarone.  On coumadin as per pharmacy.  INR is subtherapeutic.   H/o nephrostomy tube: IR consulted for replacement.    Some redness around the fistula on admission: none seen today.      DVT prophylaxis: coumadin.  Code Status: (full code.  Family Communication: family at bedside.  Disposition Plan: transfer to telemetry.    Consultants:   none   Procedures:   none   Antimicrobials:  Vancomycin zosyn 8/14.   Subjective: NO NEW COMPLAINTS.  Objective: Vitals:   02/01/16 0500 02/01/16 0600 02/01/16 0700 02/01/16 0725  BP: (!) 94/47 (!) 93/49  113/63  Pulse: 81 74 74 92  Resp: 15 15 14 18   Temp:    98.4 F (36.9 C)  TempSrc:    Oral  SpO2: 99% 99% 99% 100%  Weight:      Height:        Intake/Output Summary (Last 24 hours) at 02/01/16 0946 Last data filed at 02/01/16 0200  Gross per 24 hour    Intake             1250 ml  Output              220 ml  Net             1030 ml   Filed Weights   01/30/16 1650 01/31/16 1445  Weight: 79.4 kg (175 lb) 79.1 kg (174 lb 6.1 oz)    Examination:  General exam: Appears calm and comfortable  Respiratory system: Clear to auscultation. Respiratory effort normal. Cardiovascular system: S1 & S2 heard, RRR. No JVD, murmurs, rubs, gallops or clicks. No pedal edema. Gastrointestinal system: Abdomen is nondistended, soft and nontender. No organomegaly or masses felt. Normal bowel sounds heard. Central nervous system: Alert and oriented. No focal neurological deficits. Extremities: Symmetric 5 x 5 power. Skin: No rashes, lesions or ulcers     Data Reviewed: I have personally reviewed following labs and imaging studies  CBC:  Recent Labs Lab 01/30/16 1736 01/31/16 0452 02/01/16 0345  WBC 14.2* 10.1 8.2  NEUTROABS 12.9*  --  7.2  HGB 9.3* 10.2* 8.7*  HCT 30.3* 34.2* 28.6*  MCV 88.3 91.2 88.0  PLT 184 175 [id number]   Basic Metabolic Panel:  Recent Labs Lab 01/30/16 1736 01/31/16 0452 02/01/16 0345  NA 134* 135 135  K 3.2* 3.5 3.4*  CL 95* 95* 98*  CO2 31 27  26  GLUCOSE 113* 94 95  BUN 9 15 25*  CREATININE 2.81* 4.06* 5.83*  CALCIUM 8.0* 8.6* 8.1*  PHOS  --   --  3.2   GFR: Estimated Creatinine Clearance: 8.8 mL/min (by C-G formula based on SCr of 5.83 mg/dL). Liver Function Tests:  Recent Labs Lab 01/30/16 1736 02/01/16 0345  AST 36  --   ALT 25  --   ALKPHOS 82  --   BILITOT 0.7  --   PROT 5.8*  --   ALBUMIN 2.7* 2.3*   No results for input(s): LIPASE, AMYLASE in the last 168 hours. No results for input(s): AMMONIA in the last 168 hours. Coagulation Profile:  Recent Labs Lab 01/29/16 0851 01/30/16 1736 01/31/16 0452 02/01/16 0345  INR 1.5 1.66 1.64 1.88   Cardiac Enzymes:  Recent Labs Lab 01/30/16 1736  TROPONINI 0.03*   BNP (last 3 results) No results for input(s): PROBNP in the last 8760  hours. HbA1C: No results for input(s): HGBA1C in the last 72 hours. CBG: No results for input(s): GLUCAP in the last 168 hours. Lipid Profile: No results for input(s): CHOL, HDL, LDLCALC, TRIG, CHOLHDL, LDLDIRECT in the last 72 hours. Thyroid Function Tests: No results for input(s): TSH, T4TOTAL, FREET4, T3FREE, THYROIDAB in the last 72 hours. Anemia Panel: No results for input(s): VITAMINB12, FOLATE, FERRITIN, TIBC, IRON, RETICCTPCT in the last 72 hours. Sepsis Labs:  Recent Labs Lab 01/30/16 1734 01/30/16 1949 01/30/16 1958 01/30/16 2310  PROCALCITON  --  0.49  --   --   LATICACIDVEN 0.80 1.1 1.02 1.8    Recent Results (from the past 240 hour(s))  Culture, blood (Routine x 2)     Status: None (Preliminary result)   Collection Time: 01/30/16  5:18 PM  Result Value Ref Range Status   Specimen Description RIGHT ANTECUBITAL  Final   Special Requests IN PEDIATRIC BOTTLE 3CC  Final   Culture NO GROWTH < 24 HOURS  Final   Report Status PENDING  Incomplete  Culture, blood (Routine x 2)     Status: None (Preliminary result)   Collection Time: 01/30/16  5:28 PM  Result Value Ref Range Status   Specimen Description BLOOD RIGHT HAND  Final   Special Requests BOTTLES DRAWN AEROBIC AND ANAEROBIC 5CC  Final   Culture NO GROWTH < 24 HOURS  Final   Report Status PENDING  Incomplete  Urine culture     Status: Abnormal   Collection Time: 01/30/16  5:45 PM  Result Value Ref Range Status   Specimen Description URINE, CATHETERIZED  Final   Special Requests NONE  Final   Culture 20,000 COLONIES/mL PROTEUS MIRABILIS (A)  Final   Report Status 02/01/2016 FINAL  Final   Organism ID, Bacteria PROTEUS MIRABILIS (A)  Final      Susceptibility   Proteus mirabilis - MIC*    AMPICILLIN <=2 SENSITIVE Sensitive     CEFAZOLIN <=4 SENSITIVE Sensitive     CEFTRIAXONE <=1 SENSITIVE Sensitive     CIPROFLOXACIN <=0.25 SENSITIVE Sensitive     GENTAMICIN 2 SENSITIVE Sensitive     IMIPENEM 2 SENSITIVE  Sensitive     NITROFURANTOIN 64 RESISTANT Resistant     TRIMETH/SULFA <=20 SENSITIVE Sensitive     AMPICILLIN/SULBACTAM <=2 SENSITIVE Sensitive     PIP/TAZO <=4 SENSITIVE Sensitive     * 20,000 COLONIES/mL PROTEUS MIRABILIS  MRSA PCR Screening     Status: Abnormal   Collection Time: 01/31/16  2:29 PM  Result Value Ref Range Status  MRSA by PCR POSITIVE (A) NEGATIVE Final    Comment:        The GeneXpert MRSA Assay (FDA approved for NASAL specimens only), is one component of a comprehensive MRSA colonization surveillance program. It is not intended to diagnose MRSA infection nor to guide or monitor treatment for MRSA infections. RESULT CALLED TO, READ BACK BY AND VERIFIED WITH: S.ADAMS RN AT 1652 01/31/16 BY A.DAVIS          Radiology Studies: Dg Chest 2 View  Result Date: 01/30/2016 CLINICAL DATA:  Nausea and weakness EXAM: CHEST  2 VIEW COMPARISON:  01/27/2016 FINDINGS: Cardiac shadow is stable. Dialysis catheter is again seen. The lungs are well aerated bilaterally. Mild early infiltrate is noted in the left lung base. No bony abnormality is seen. IMPRESSION: Early left basilar infiltrate. Electronically Signed   By: Inez Catalina M.D.   On: 01/30/2016 18:23   [name] Extrem Up Left Ltd  Result Date: 01/30/2016 CLINICAL DATA:  Soft tissue swelling at dialysis fistula site, check for possible abscess EXAM: ULTRASOUND left UPPER EXTREMITY LIMITED TECHNIQUE: Ultrasound examination of the upper extremity soft tissues was performed in the area of clinical concern. COMPARISON:  None FINDINGS: No fluid collection is identified to suggest focal abscess. The patient's known fistula is patent. No significant edema is noted. IMPRESSION: No evidence of focal abscess. Electronically Signed   By: Inez Catalina M.D.   On: 01/30/2016 20:25        Scheduled Meds: . amiodarone  200 mg Oral Daily  . famotidine  20 mg Oral Daily  . latanoprost  1 drop Both Eyes QHS  . multivitamin with minerals   1 tablet Oral Daily  . mupirocin ointment  1 application Nasal BID  . piperacillin-tazobactam (ZOSYN)  IV  3.375 g Intravenous Q12H  . saccharomyces boulardii  250 mg Oral BID  . timolol  1 drop Both Eyes q morning - 10a  . [START ON 02/02/2016] vancomycin  750 mg Intravenous Q T,Th,Sa-HD  . warfarin  5 mg Oral ONCE-1800  . Warfarin - Pharmacist Dosing Inpatient   Does not apply q1800   Continuous Infusions:    LOS: 2 days    Time spent:30 MINUTES.     Hosie Poisson, MD [facility] Pager [phone]   If 7PM-7AM, please contact night-coverage [email or web address] Password [facility] 02/01/2016, 9:46 AM

## 2016-02-01 NOTE — Procedures (Signed)
S/p RT PCN EXCHG NO COMP STABLE FULL REPORT IN PACS

## 2016-02-01 NOTE — Progress Notes (Signed)
ANTICOAGULATION CONSULT NOTE - FOLLOW UP  Pharmacy Consult:  Coumadin Indication: atrial fibrillation  Allergies  Allergen Reactions  . Vicodin [Hydrocodone-Acetaminophen] Nausea And Vomiting  . Chlorhexidine Rash    Sunburn    rash  . Percocet [Oxycodone-Acetaminophen] Nausea And Vomiting    Patient Measurements: Height: 5\' 3"  (160 cm) Weight: 174 lb 6.1 oz (79.1 kg) IBW/kg (Calculated) : 52.4   Vital Signs: Temp: 98.4 F (36.9 C) (08/14 0725) Temp Source: Oral (08/14 0725) BP: 113/63 (08/14 0725) Pulse Rate: 92 (08/14 0725)  Labs:  Recent Labs  01/30/16 1736 01/31/16 0452 02/01/16 0345  HGB 9.3* 10.2* 8.7*  HCT 30.3* 34.2* 28.6*  PLT 184 175 167  LABPROT 19.8* 19.6* 21.8*  INR 1.66 1.64 1.88  CREATININE 2.81* 4.06* 5.83*  TROPONINI 0.03*  --   --     Estimated Creatinine Clearance: 8.8 mL/min (by C-G formula based on SCr of 5.83 mg/dL).   Assessment: 65 YOF with history of AFib to continue on Coumadin.  INR continues to be sub-therapeutic at 1.88 this am now trending up. No bleeding reported. Hgb down this am to 8.7 without any bleeding complications noted, hgb seems to be rather labile based on previous results, plt ct ok.  Home Coumadin dose: 2.5mg  daily except 5mg  on Mon/Thurs   Goal of Therapy:  INR 2-3   Plan:  - Coumadin 5 mg PO today - Daily PT / INR  Erin Hearing PharmD., BCPS Clinical Pharmacist Pager [phone] 02/01/2016 9:31 AM

## 2016-02-02 LAB — RENAL FUNCTION PANEL
Albumin: 2.2 g/dL — ABNORMAL LOW (ref 3.5–5.0)
Anion gap: 11 (ref 5–15)
BUN: 35 mg/dL — ABNORMAL HIGH (ref 6–20)
CO2: 26 mmol/L (ref 22–32)
Calcium: 8.5 mg/dL — ABNORMAL LOW (ref 8.9–10.3)
Chloride: 99 mmol/L — ABNORMAL LOW (ref 101–111)
Creatinine, Ser: 7.37 mg/dL — ABNORMAL HIGH (ref 0.44–1.00)
GFR calc Af Amer: 6 mL/min — ABNORMAL LOW (ref >60)
GFR calc non Af Amer: 5 mL/min — ABNORMAL LOW (ref >60)
Glucose, Bld: 100 mg/dL — ABNORMAL HIGH (ref 65–99)
Phosphorus: 3.1 mg/dL (ref 2.5–4.6)
Potassium: 3.2 mmol/L — ABNORMAL LOW (ref 3.5–5.1)
Sodium: 136 mmol/L (ref 135–145)

## 2016-02-02 LAB — CBC
HCT: 28 % — ABNORMAL LOW (ref 36.0–46.0)
Hemoglobin: 8.7 g/dL — ABNORMAL LOW (ref 12.0–15.0)
MCH: 27.4 pg (ref 26.0–34.0)
MCHC: 31.1 g/dL (ref 30.0–36.0)
MCV: 88.3 fL (ref 78.0–100.0)
Platelets: 196 10*3/uL (ref 150–400)
RBC: 3.17 MIL/uL — ABNORMAL LOW (ref 3.87–5.11)
RDW: 18.3 % — ABNORMAL HIGH (ref 11.5–15.5)
WBC: 8.1 10*3/uL (ref 4.0–10.5)

## 2016-02-02 LAB — PROTIME-INR
INR: 2.05
Prothrombin Time: 23.5 seconds — ABNORMAL HIGH (ref 11.4–15.2)

## 2016-02-02 LAB — HEPATITIS B SURFACE ANTIGEN: Hepatitis B Surface Ag: NEGATIVE

## 2016-02-02 MED ORDER — VANCOMYCIN HCL IN DEXTROSE 750-5 MG/150ML-% IV SOLN
INTRAVENOUS | Status: AC
  Administered 2016-02-02: 750 mg via INTRAVENOUS
  Filled 2016-02-02: qty 150

## 2016-02-02 MED ORDER — WARFARIN SODIUM 2.5 MG PO TABS
2.5000 mg | ORAL_TABLET | Freq: Once | ORAL | Status: AC
  Administered 2016-02-02: 2.5 mg via ORAL
  Filled 2016-02-02: qty 1

## 2016-02-02 MED ORDER — POTASSIUM CHLORIDE CRYS ER 20 MEQ PO TBCR
40.0000 meq | EXTENDED_RELEASE_TABLET | Freq: Once | ORAL | Status: AC
  Administered 2016-02-02: 40 meq via ORAL
  Filled 2016-02-02: qty 2

## 2016-02-02 NOTE — Progress Notes (Signed)
ANTICOAGULATION CONSULT NOTE - FOLLOW UP  Pharmacy Consult:  Coumadin Indication: atrial fibrillation  Allergies  Allergen Reactions  . Vicodin [Hydrocodone-Acetaminophen] Nausea And Vomiting  . Chlorhexidine Rash    Sunburn    rash  . Percocet [Oxycodone-Acetaminophen] Nausea And Vomiting    Patient Measurements: Height: 5\' 3"  (160 cm) Weight: 172 lb 13.5 oz (78.4 kg) IBW/kg (Calculated) : 52.4   Vital Signs: Temp: 98 F (36.7 C) (08/15 1102) Temp Source: Oral (08/15 1102) BP: 103/49 (08/15 1102) Pulse Rate: 90 (08/15 1102)  Labs:  Recent Labs  01/30/16 1736 01/31/16 0452 02/01/16 0345 02/02/16 0700  HGB 9.3* 10.2* 8.7* 8.7*  HCT 30.3* 34.2* 28.6* 28.0*  PLT 184 175 167 196  LABPROT 19.8* 19.6* 21.8*  --   INR 1.66 1.64 1.88  --   CREATININE 2.81* 4.06* 5.83* 7.37*  TROPONINI 0.03*  --   --   --     Estimated Creatinine Clearance: 6.9 mL/min (by C-G formula based on SCr of 7.37 mg/dL).   Assessment: 82 YOF with history of AFib to continue on Coumadin. Missed INR draw this AM d/t HD, INR 1.88 yesterday, likely ~ 2-2.5 now. Hgb down this am to 8.7 without any bleeding complications noted, hgb seems to be rather labile based on previous results, plt ct ok.  Home Coumadin dose: 2.5mg  daily except 5mg  on Mon/Thurs   Goal of Therapy:  INR 2-3   Plan:  - Coumadin 2.5 mg PO today - Daily PT / INR  Maryanna Shape, PharmD, BCPS  Clinical Pharmacist  Pager: [phone]   02/02/2016 11:37 AM

## 2016-02-02 NOTE — Procedures (Signed)
I was present at this dialysis session. I have reviewed the session itself and made appropriate changes.   4K bath.  3L UF.  Tol well using TDC.  PCN exchanged yesterday Low grade fever this AM No micro results from HD unit, I'm not sure taht cultures were obtained.   U Cx low positive for proteus; remains Vancomycin / Zosyn; BCx x2 NGTD   Filed Weights   01/31/16 1445 02/01/16 2021 02/02/16 0657  Weight: 79.1 kg (174 lb 6.1 oz) 80.5 kg (177 lb 7.5 oz) 81 kg (178 lb 9.2 oz)     Recent Labs Lab 02/02/16 0700  NA 136  K 3.2*  CL 99*  CO2 26  GLUCOSE 100*  BUN 35*  CREATININE 7.37*  CALCIUM 8.5*  PHOS 3.1     Recent Labs Lab 01/30/16 1736 01/31/16 0452 02/01/16 0345 02/02/16 0700  WBC 14.2* 10.1 8.2 8.1  NEUTROABS 12.9*  --  7.2  --   HGB 9.3* 10.2* 8.7* 8.7*  HCT 30.3* 34.2* 28.6* 28.0*  MCV 88.3 91.2 88.0 88.3  PLT 184 175 167 196    Scheduled Meds: . amiodarone  200 mg Oral Daily  . famotidine  20 mg Oral Daily  . latanoprost  1 drop Both Eyes QHS  . multivitamin with minerals  1 tablet Oral Daily  . mupirocin ointment  1 application Nasal BID  . piperacillin-tazobactam (ZOSYN)  IV  3.375 g Intravenous Q12H  . saccharomyces boulardii  250 mg Oral BID  . timolol  1 drop Both Eyes q morning - 10a  . vancomycin  750 mg Intravenous Q T,Th,Sa-HD  . Warfarin - Pharmacist Dosing Inpatient   Does not apply q1800   Continuous Infusions:  PRN Meds:.sodium chloride, sodium chloride, acetaminophen, alteplase, feeding supplement (NEPRO CARB STEADY), heparin, heparin, sodium chloride   Pearson Grippe  MD 02/02/2016, 8:48 AM

## 2016-02-02 NOTE — Progress Notes (Signed)
I asked pt at 1238

## 2016-02-02 NOTE — Progress Notes (Signed)
Pharmacy Antibiotic Note  [name] is a 70 y.o. female admitted on 01/30/2016 with sepsis.  Pharmacy has been consulted for vancomycin and zosyn dosing. Tmax 100.1, WBC WNL, LA up 1.8, PCT 0.49.  Received 750mg  with HD 8/15  Plan: Vancomycin 750mg  IV qHD Zosyn 3.375g IV q12h Monitor culture data, HD plan and clinical course VT at [facility] if continues Consider de-escalate to ctx if blood culture remains negative?  Height: 5\' 3"  (160 cm) Weight: 172 lb 13.5 oz (78.4 kg) IBW/kg (Calculated) : 52.4  Temp (24hrs), Avg:98.7 F (37.1 C), Min:97.7 F (36.5 C), Max:100.1 F (37.8 C)   Recent Labs Lab 01/30/16 1734 01/30/16 1736 01/30/16 1949 01/30/16 1958 01/30/16 2310 01/31/16 0452 02/01/16 0345 02/02/16 0700  WBC  --  14.2*  --   --   --  10.1 8.2 8.1  CREATININE  --  2.81*  --   --   --  4.06* 5.83* 7.37*  LATICACIDVEN 0.80  --  1.1 1.02 1.8  --   --   --     Estimated Creatinine Clearance: 6.9 mL/min (by C-G formula based on SCr of 7.37 mg/dL).    Allergies  Allergen Reactions  . Vicodin [Hydrocodone-Acetaminophen] Nausea And Vomiting  . Chlorhexidine Rash    Sunburn    rash  . Percocet [Oxycodone-Acetaminophen] Nausea And Vomiting    Antimicrobials this admission: Vanc 8/12 >>  Zosyn 8/12 >>   Dose adjustments this admission: n/a  Microbiology results: 8/12 BCx x2 -ngtd 8/12 UCx -proteus-pan sensitive except for nitrofurantoin  Maryanna Shape, PharmD, BCPS  Clinical Pharmacist  Pager: [phone]   02/02/2016 11:34 AM

## 2016-02-02 NOTE — Progress Notes (Signed)
Patient ID: [name], female   DOB: 11/23/1945, 70 y.o.   MRN: [id number]  PROGRESS NOTE    [name]  VAN:[id number] DOB: 04-Dec-1945 DOA: 01/30/2016  PCP: [name], MD   Brief Narrative:  70 y.o.femalewith past medical history significant of ESRD on hemodialysis TTS who presented to [facility] ED with sepsis with hypotension and possible left sided pneumonia.   Assessment & Plan:   Sepsis from possible left lower lobe pneumonia, proteus mirabilis UTI - Sepsis criteria met on admission, secondary to possible left lobe pneumonia - CXR on admission with early left basilar infiltrate - On vanco and zosyn - Blood cultures preliminary reports so far negative  - Urine culture showed proteus mirabilis species - Continue current abx until final blood culture report back   ESRD on HD - Management per renal - HD TTS  Anemia of chronic kidney disease - hemoglobin stable .   Mild elevation of troponin - Probably demand ischemia from sepsis.  - No reports of chest pain   PAF - CHADS vasc score 3 - On anticoagulation with coumadin - Rate controlled with amiodarone   H/o nephrostomy tube - S/P nephrostomy exchange 8/14  Hypokalemia - Supplemented    DVT prophylaxis: On full dose anticoagulation with coumadin  Code Status: full code  Family Communication: no family at the bedside this am Disposition Plan: home once sepsis etiology resolves    Consultants:   Nephrology   Procedures:   HD TTS  Antimicrobials:   Vanco and zosyn with HD -->   Subjective: No overnight events.   Objective: Vitals:   02/02/16 1030 02/02/16 1100 02/02/16 1102 02/02/16 1146  BP: (!) 107/53 (!) 111/53 (!) 103/49 (!) 124/41  Pulse: 86 87 90 94  Resp:   18 18  Temp:   98 F (36.7 C) 98 F (36.7 C)  TempSrc:   Oral Oral  SpO2:   100% 100%  Weight:   78.4 kg (172 lb 13.5 oz)   Height:        Intake/Output Summary (Last 24 hours) at 02/02/16 1224 Last data filed  at 02/02/16 1150  Gross per 24 hour  Intake              600 ml  Output             2855 ml  Net            -2255 ml   Filed Weights   02/01/16 2021 02/02/16 0657 02/02/16 1102  Weight: 80.5 kg (177 lb 7.5 oz) 81 kg (178 lb 9.2 oz) 78.4 kg (172 lb 13.5 oz)    Examination:  General exam: Appears calm and comfortable  Respiratory system: Clear to auscultation. Respiratory effort normal. Cardiovascular system: S1 & S2 heard, Rate conrtrolled Gastrointestinal system: Abdomen is nondistended, soft and nontender. No organomegaly or masses felt. Normal bowel sounds heard. (+) nephrostomy  Central nervous system: Alert and oriented. No focal neurological deficits. Extremities: Symmetric 5 x 5 power. Skin: No rashes, lesions or ulcers Psychiatry: Judgement and insight appear normal. Mood & affect appropriate.   Data Reviewed: I have personally reviewed following labs and imaging studies  CBC:  Recent Labs Lab 01/30/16 1736 01/31/16 0452 02/01/16 0345 02/02/16 0700  WBC 14.2* 10.1 8.2 8.1  NEUTROABS 12.9*  --  7.2  --   HGB 9.3* 10.2* 8.7* 8.7*  HCT 30.3* 34.2* 28.6* 28.0*  MCV 88.3 91.2 88.0 88.3  PLT 184 175 167 196  Basic Metabolic Panel:  Recent Labs Lab 01/30/16 1736 01/31/16 0452 02/01/16 0345 02/02/16 0700  NA 134* 135 135 136  K 3.2* 3.5 3.4* 3.2*  CL 95* 95* 98* 99*  CO2 _0 GLUCOSE 113* 94 95 100*  BUN 9 15 25* 35*  CREATININE 2.81* 4.06* 5.83* 7.37*  CALCIUM 8.0* 8.6* 8.1* 8.5*  PHOS  --   --  3.2 3.1   GFR: Estimated Creatinine Clearance: 6.9 mL/min (by C-G formula based on SCr of 7.37 mg/dL). Liver Function Tests:  Recent Labs Lab 01/30/16 1736 02/01/16 0345 02/02/16 0700  AST 36  --   --   ALT 25  --   --   ALKPHOS 82  --   --   BILITOT 0.7  --   --   PROT 5.8*  --   --   ALBUMIN 2.7* 2.3* 2.2*   No results for input(s): LIPASE, AMYLASE in the last 168 hours. No results for input(s): AMMONIA in the last 168 hours. Coagulation  Profile:  Recent Labs Lab 01/29/16 0851 01/30/16 1736 01/31/16 0452 02/01/16 0345  INR 1.5 1.66 1.64 1.88   Cardiac Enzymes:  Recent Labs Lab 01/30/16 1736  TROPONINI 0.03*   BNP (last 3 results) No results for input(s): PROBNP in the last 8760 hours. HbA1C: No results for input(s): HGBA1C in the last 72 hours. CBG: No results for input(s): GLUCAP in the last 168 hours. Lipid Profile: No results for input(s): CHOL, HDL, LDLCALC, TRIG, CHOLHDL, LDLDIRECT in the last 72 hours. Thyroid Function Tests: No results for input(s): TSH, T4TOTAL, FREET4, T3FREE, THYROIDAB in the last 72 hours. Anemia Panel: No results for input(s): VITAMINB12, FOLATE, FERRITIN, TIBC, IRON, RETICCTPCT in the last 72 hours. Urine analysis:    Component Value Date/Time   COLORURINE YELLOW 01/30/2016 1745   APPEARANCEUR TURBID (A) 01/30/2016 1745   LABSPEC 1.014 01/30/2016 1745   PHURINE 8.0 01/30/2016 1745   GLUCOSEU NEGATIVE 01/30/2016 1745   HGBUR MODERATE (A) 01/30/2016 1745   BILIRUBINUR NEGATIVE 01/30/2016 1745   BILIRUBINUR neg 09/18/2014 0920   KETONESUR NEGATIVE 01/30/2016 1745   PROTEINUR 100 (A) 01/30/2016 1745   UROBILINOGEN 0.2 05/02/2015 2020   NITRITE NEGATIVE 01/30/2016 1745   LEUKOCYTESUR LARGE (A) 01/30/2016 1745   Sepsis Labs: _1 (procalcitonin:4,lacticidven:4)   Culture, blood (Routine x 2)     Status: None (Preliminary result)   Collection Time: 01/30/16  5:18 PM  Result Value Ref Range Status   Specimen Description RIGHT ANTECUBITAL  Final   Special Requests IN PEDIATRIC BOTTLE 3CC  Final   Culture NO GROWTH 2 DAYS  Final   Report Status PENDING  Incomplete  Culture, blood (Routine x 2)     Status: None (Preliminary result)   Collection Time: 01/30/16  5:28 PM  Result Value Ref Range Status   Specimen Description BLOOD RIGHT HAND  Final   Culture NO GROWTH 2 DAYS  Final   Report Status PENDING  Incomplete  Urine culture     Status: Abnormal   Collection  Time: 01/30/16  5:45 PM  Result Value Ref Range Status   Specimen Description URINE, CATHETERIZED  Final   Special Requests NONE  Final   Report Status 02/01/2016 FINAL  Final   Organism ID, Bacteria PROTEUS MIRABILIS (A)  Final      Susceptibility   Proteus mirabilis - MIC*    AMPICILLIN <=2 SENSITIVE Sensitive     CEFAZOLIN <=4 SENSITIVE Sensitive     CEFTRIAXONE <=1  SENSITIVE Sensitive     CIPROFLOXACIN <=0.25 SENSITIVE Sensitive     GENTAMICIN 2 SENSITIVE Sensitive     IMIPENEM 2 SENSITIVE Sensitive     NITROFURANTOIN 64 RESISTANT Resistant     TRIMETH/SULFA <=20 SENSITIVE Sensitive     AMPICILLIN/SULBACTAM <=2 SENSITIVE Sensitive     PIP/TAZO <=4 SENSITIVE Sensitive     * 20,000 COLONIES/mL PROTEUS MIRABILIS  MRSA PCR Screening     Status: Abnormal   Collection Time: 01/31/16  2:29 PM  Result Value Ref Range Status   MRSA by PCR POSITIVE (A) NEGATIVE Final      Radiology Studies: Dg Chest 2 View Result Date: 01/30/2016 Early left basilar infiltrate. Electronically Signed   By: Inez Catalina M.D.   On: 01/30/2016 18:23   [name] Extrem Up Left Ltd Result Date: 01/30/2016 No evidence of focal abscess. Electronically Signed   By: Inez Catalina M.D.   On: 01/30/2016 20:25   Ir Nephrostomy Exchange Right Result Date: 02/01/2016 Successful fluoroscopic exchange of the 10 French right nephrostomy (10 Leeroy Bock Bettey Mare catheter).     Scheduled Meds: . amiodarone  200 mg Oral Daily  . famotidine  20 mg Oral Daily  . latanoprost  1 drop Both Eyes QHS  . multivitamin with minerals  1 tablet Oral Daily  . mupirocin ointment  1 application Nasal BID  . piperacillin-tazobactam   3.375 g Intravenous Q12H  . saccharomyces boulardii  250 mg Oral BID  . timolol  1 drop Both Eyes q morning - 10a  . vancomycin  750 mg Intravenous Q T,Th,Sa-HD  . warfarin  2.5 mg Oral ONCE-1800   Continuous Infusions:    LOS: 3 days    Time spent: 25 minutes  Greater than 50% of the time spent on  counseling and coordinating the care.   Leisa Lenz, MD [facility] Pager [phone]  If 7PM-7AM, please contact night-coverage [email or web address] Password [facility] 02/02/2016, 12:24 PM

## 2016-02-03 DIAGNOSIS — A419 Sepsis, unspecified organism: Principal | ICD-10-CM

## 2016-02-03 DIAGNOSIS — N186 End stage renal disease: Secondary | ICD-10-CM

## 2016-02-03 DIAGNOSIS — I10 Essential (primary) hypertension: Secondary | ICD-10-CM

## 2016-02-03 DIAGNOSIS — I480 Paroxysmal atrial fibrillation: Secondary | ICD-10-CM

## 2016-02-03 LAB — PROTIME-INR
INR: 2.42
Prothrombin Time: 26.8 seconds — ABNORMAL HIGH (ref 11.4–15.2)

## 2016-02-03 LAB — BASIC METABOLIC PANEL
Anion gap: 8 (ref 5–15)
BUN: 17 mg/dL (ref 6–20)
CO2: 26 mmol/L (ref 22–32)
Calcium: 8.6 mg/dL — ABNORMAL LOW (ref 8.9–10.3)
Chloride: 104 mmol/L (ref 101–111)
Creatinine, Ser: 4.23 mg/dL — ABNORMAL HIGH (ref 0.44–1.00)
GFR calc Af Amer: 11 mL/min — ABNORMAL LOW (ref >60)
GFR calc non Af Amer: 10 mL/min — ABNORMAL LOW (ref >60)
Glucose, Bld: 95 mg/dL (ref 65–99)
Potassium: 4.1 mmol/L (ref 3.5–5.1)
Sodium: 138 mmol/L (ref 135–145)

## 2016-02-03 MED ORDER — AMOXICILLIN-POT CLAVULANATE 500-125 MG PO TABS: 1.0000 | ORAL_TABLET | Freq: Every day | ORAL | Status: DC

## 2016-02-03 MED ORDER — AMOXICILLIN-POT CLAVULANATE 500-125 MG PO TABS: 1.0000 | ORAL_TABLET | Freq: Every day | ORAL | 0 refills | Status: DC

## 2016-02-03 MED ORDER — WARFARIN SODIUM 2.5 MG PO TABS: 2.5000 mg | ORAL_TABLET | Freq: Once | ORAL | Status: DC

## 2016-02-03 MED ORDER — WARFARIN SODIUM 5 MG PO TABS: 5.0000 mg | ORAL_TABLET | Freq: Every day | ORAL | Status: DC

## 2016-02-03 MED ORDER — DARBEPOETIN ALFA 100 MCG/0.5ML IJ SOSY: 100.0000 ug | PREFILLED_SYRINGE | INTRAMUSCULAR | Status: DC

## 2016-02-03 NOTE — Progress Notes (Signed)
ANTICOAGULATION CONSULT NOTE - FOLLOW UP  Pharmacy Consult:  Coumadin Indication: atrial fibrillation  Allergies  Allergen Reactions  . Vicodin [Hydrocodone-Acetaminophen] Nausea And Vomiting  . Chlorhexidine Rash    Sunburn    rash  . Percocet [Oxycodone-Acetaminophen] Nausea And Vomiting    Patient Measurements: Height: 5\' 3"  (160 cm) Weight: 178 lb 9.2 oz (81 kg) IBW/kg (Calculated) : 52.4   Vital Signs: Temp: 97.5 F (36.4 C) (08/16 0440) Temp Source: Oral (08/16 0440) BP: 113/53 (08/16 0440) Pulse Rate: 80 (08/16 0440)  Labs:  Recent Labs  02/01/16 0345 02/02/16 0700 02/02/16 1216 02/03/16 0550  HGB 8.7* 8.7*  --   --   HCT 28.6* 28.0*  --   --   PLT 167 196  --   --   LABPROT 21.8*  --  23.5* 26.8*  INR 1.88  --  2.05 2.42  CREATININE 5.83* 7.37*  --  4.23*    Estimated Creatinine Clearance: 12.3 mL/min (by C-G formula based on SCr of 4.23 mg/dL).   Assessment: 1 YOF with history of AFib to continue on Coumadin. INR 2.42 this mroning. Hgb 8.7. no bleeding complications noted, hgb seems to be rather labile based on previous results, plt ct ok.  Home Coumadin dose: 2.5mg  daily except 5mg  on Mon/Thurs   Goal of Therapy:  INR 2-3   Plan:  - Coumadin 2.5 mg PO today - Daily PT / INR  Maryanna Shape, PharmD, BCPS  Clinical Pharmacist  Pager: [phone]   02/03/2016 8:27 AM

## 2016-02-03 NOTE — Progress Notes (Signed)
Discharge:  Patient being discharged to home with daughter. IV removed. Telemetry removed. All discharge instructions reviewed. Scripts with patient. Patient has follow-up appt made for her. All belongings with patient. Patient left floor in stable condition.

## 2016-02-03 NOTE — Discharge Summary (Signed)
PATIENT DETAILS Name: [name] Age: 70 y.o. Sex: female Date of Birth: [birth year] MRN: [id number]. Admitting Physician: Etta Quill, DO WE:[id number] Marcello Moores, MD  Admit Date: 01/30/2016 Discharge date: 02/03/2016  Recommendations for Outpatient Follow-up:  1. Follow up with PCP in 1-2 weeks 2. Please obtain BMP/CBC in one week 3. Please follow up on the following pending results: follow blood cultures till final (negative at the time of discharge)  Admitted From:  Home  Disposition: [place]: No  Equipment/Devices: Has chronic right nephrostomy tube in place-exchanged on 8/14.  Discharge Condition: Stable  CODE STATUS: FULL CODE  Diet recommendation:  Heart Healthy  Brief Summary: See H&P, Labs, Consult and Test reports for all details in brief, patient is a 70 year old female with a history of ESRD on hemodialysis who presented with fever, chills and weakness. She was suspected to have a possible pneumonia and a complicated UTI (has chronic left nephrostomy in place). She was admitted and started on empiric antibiotics.   Brief Hospital Course: Sepsisfrom possible left lower lobe pneumonia, proteus mirabilis UTI: Either due to a possible pneumonia or a complicated UTI. Urine cultures positive for Proteus that is mostly pansensitive. Blood cultures are negative to date. Patient has a hemodialysis catheter and also has a right nephrostomy tube in place. She was managed with intravenous vancomycin and Zosyn. She no longer is febrile and does not appear toxic. She is wanting to go home today, since she has improved significantly, she is stable for discharge. We will transition her to Augmentin for 2 more days to complete a seven-day course of empiric antibiotics. Blood cultures will need to be followed by PCP until final-negative at the time of discharge.   End-stage renal disease: On hemodialysis (TTS), followed closely by nephrology during this  hospital stay.  Minimal troponin elevation: Suspect of no clinical significance-likely secondary to ESRD. Most recent echocardiogram in June 2017 showed preserved EF without any wall motion abnormalities. No further workup required.  Anemia of chronic disease: Likely related to ESRD-hemoglobin stable, continue outpatient monitoring per nephrology.  History of paroxysmal atrial fibrillation: Rate controlled with amiodarone and metoprolol. On Coumadin chronically, INR therapeutic. Resume prior dosing of Coumadin on discharge.  History of right nephrostomy tube: This was exchanged on 8/14 by IR, she follows with urology-she claims that this will be removed shortly in the next few weeks.  Procedures/Studies: Right nephrostomy tube exchanged 8/14  Discharge Diagnoses:  Principal Problem:   Sepsis ([place]) Active Problems:   Essential hypertension, benign   End stage renal disease (H[facility])   PAF (paroxysmal atrial fibrillation) ([place])   Discharge Instructions:  Activity:  As tolerated with Full fall precautions use walker/cane & assistance as needed  Discharge Instructions    Call MD for:  temperature >100.4    Complete by:  As directed   Diet - low sodium heart healthy    Complete by:  As directed   Discharge instructions    Complete by:  As directed   Follow with Primary MD  [name], MD  and other consultant's as instructed your Hospitalist MD  Please get a complete blood count and chemistry panel checked by your Primary MD at your next visit, and again as instructed by your Primary MD.  Get Medicines reviewed and adjusted: Please take all your medications with you for your next visit with your Primary MD  Laboratory/radiological data: Please request your Primary MD to go over all hospital tests and procedure/radiological results  at the follow up, please ask your Primary MD to get all Hospital records sent to his/her office.  In some cases, they will be blood work, cultures  and biopsy results pending at the time of your discharge. Please request that your primary care M.D. follows up on these results.  Also Note the following: If you experience worsening of your admission symptoms, develop shortness of breath, life threatening emergency, suicidal or homicidal thoughts you must seek medical attention immediately by calling 911 or calling your MD immediately  if symptoms less severe.  You must read complete instructions/literature along with all the possible adverse reactions/side effects for all the Medicines you take and that have been prescribed to you. Take any new Medicines after you have completely understood and accpet all the possible adverse reactions/side effects.   Do not drive when taking Pain medications or sleeping medications (Benzodaizepines)  Do not take more than prescribed Pain, Sleep and Anxiety Medications. It is not advisable to combine anxiety,sleep and pain medications without talking with your primary care practitioner  Special Instructions: If you have smoked or chewed Tobacco  in the last 2 yrs please stop smoking, stop any regular Alcohol  and or any Recreational drug use.  Wear Seat belts while driving.  Please note: You were cared for by a hospitalist during your hospital stay. Once you are discharged, your primary care physician will handle any further medical issues. Please note that NO REFILLS for any discharge medications will be authorized once you are discharged, as it is imperative that you return to your primary care physician (or establish a relationship with a primary care physician if you do not have one) for your post hospital discharge needs so that they can reassess your need for medications and monitor your lab values.   Increase activity slowly    Complete by:  As directed       Medication List    TAKE these medications   acetaminophen 500 MG tablet Commonly known as:  TYLENOL Take 1,000 mg by mouth every 6 (six)  hours as needed for moderate pain or headache.   amiodarone 200 MG tablet Commonly known as:  PACERONE Take 1 tablet (200 mg total) by mouth daily.   amoxicillin-clavulanate 500-125 MG tablet Commonly known as:  AUGMENTIN Take 1 tablet (500 mg total) by mouth daily. Start taking on:  02/04/2016   bimatoprost 0.01 % Soln Commonly known as:  LUMIGAN Place 1 drop into both eyes at bedtime.   calcium acetate 667 MG capsule Commonly known as:  PHOSLO Take 667 mg by mouth 3 (three) times daily with meals.   feeding supplement (NEPRO CARB STEADY) Liqd Take 237 mLs by mouth daily. What changed:  when to take this  reasons to take this   metoprolol tartrate 25 MG tablet Commonly known as:  LOPRESSOR Take 1 tablet (25 mg total) by mouth 2 (two) times daily. What changed:  how much to take   multivitamin with minerals tablet Take 1 tablet by mouth daily.   ranitidine 150 MG tablet Commonly known as:  ZANTAC Take 150 mg by mouth 2 (two) times daily as needed for heartburn.   saccharomyces boulardii 250 MG capsule Commonly known as:  FLORASTOR Take 1 capsule (250 mg total) by mouth 2 (two) times daily.   sodium chloride 0.65 % Soln nasal spray Commonly known as:  OCEAN Place 1 spray into both nostrils 3 (three) times daily as needed for congestion.   timolol 0.5 % ophthalmic  solution Commonly known as:  BETIMOL Place 1 drop into both eyes every morning.   warfarin 5 MG tablet Commonly known as:  COUMADIN Take 2.5-5 mg by mouth daily. Take 2.5mg  every day except on Monday and Thursdays take 5mg  tablet per patient What changed:  Another medication with the same name was changed. Make sure you understand how and when to take each.   warfarin 5 MG tablet Commonly known as:  COUMADIN Take 1 tablet (5 mg total) by mouth daily at 6 PM. On Mondays and Thursdays only What changed:  additional instructions       Allergies  Allergen Reactions  . Vicodin  [Hydrocodone-Acetaminophen] Nausea And Vomiting  . Chlorhexidine Rash    Sunburn    rash  . Percocet [Oxycodone-Acetaminophen] Nausea And Vomiting     Consultations:   nephrology   Other Procedures/Studies: Dg Chest 2 View  Result Date: 01/30/2016 CLINICAL DATA:  Nausea and weakness EXAM: CHEST  2 VIEW COMPARISON:  01/27/2016 FINDINGS: Cardiac shadow is stable. Dialysis catheter is again seen. The lungs are well aerated bilaterally. Mild early infiltrate is noted in the left lung base. No bony abnormality is seen. IMPRESSION: Early left basilar infiltrate. Electronically Signed   By: Inez Catalina M.D.   On: 01/30/2016 18:23   Dg Chest 2 View  Result Date: 01/27/2016 CLINICAL DATA:  Dialysis pt with Lt anterior pleuritic chest pain pain, past hx of effusion in chest EXAM: CHEST  2 VIEW COMPARISON:  11/23/2015 FINDINGS: Cardiac silhouette is normal in size and configuration. Small hiatal hernia. No mediastinal or hilar masses or convincing adenopathy. Mild linear/reticular opacity at the left lung base. This most likely atelectasis. Stable. Lungs are otherwise clear. No pleural effusion or pneumothorax. Right anterior chest wall, internal jugular, dual-lumen central venous catheter is stable and well positioned. Bony thorax is demineralized but intact. IMPRESSION: 1. No acute cardiopulmonary disease. 2. Small hiatal hernia. Electronically Signed   By: Lajean Manes M.D.   On: 01/27/2016 15:03   [name] Extrem Up Left Ltd  Result Date: 01/30/2016 CLINICAL DATA:  Soft tissue swelling at dialysis fistula site, check for possible abscess EXAM: ULTRASOUND left UPPER EXTREMITY LIMITED TECHNIQUE: Ultrasound examination of the upper extremity soft tissues was performed in the area of clinical concern. COMPARISON:  None FINDINGS: No fluid collection is identified to suggest focal abscess. The patient's known fistula is patent. No significant edema is noted. IMPRESSION: No evidence of focal abscess.  Electronically Signed   By: Inez Catalina M.D.   On: 01/30/2016 20:25   Ir Nephrostomy Exchange Right  Result Date: 02/01/2016 INDICATION: Chronic right ureteral obstruction, chronic right external nephrostomy, routine exchange EXAM: IR EXCHANGE NEPHROSTOMY RIGHT COMPARISON:  12/21/2015 MEDICATIONS: 1% lidocaine locally ANESTHESIA/SEDATION: None. CONTRAST:  10 cc Omnipaque 300 - administered into the collecting system(s) FLUOROSCOPY TIME:  Fluoroscopy Time: 1 minutes 6 seconds (7 mGy). COMPLICATIONS: None immediate. PROCEDURE: Informed written consent was obtained from the patient after a thorough discussion of the procedural risks, benefits and alternatives. All questions were addressed. Maximal Sterile Barrier Technique was utilized including caps, mask, sterile gowns, sterile gloves, sterile drape, hand hygiene and skin antiseptic. A timeout was performed prior to the initiation of the procedure. Under sterile conditions and local anesthesia, the existing right [place] nephrostomy catheter was cut and removed. A new 10 French catheter was advanced into the collecting system. The retention loop was formed within a mid to lower pole calyx. Position confirmed with contrast injection. There  is new good urine output. Catheter secured with a StatLock device and connected to external gravity drainage. No immediate complication. Patient tolerated the procedure well. IMPRESSION: Successful fluoroscopic exchange of the 10 French right nephrostomy (10 Leeroy Bock Bettey Mare catheter). Electronically Signed   By: Jerilynn Mages.  Shick M.D.   On: 02/01/2016 14:22     TODAY-DAY OF DISCHARGE:  Subjective:   Carleisha Newbery today has no headache,no chest abdominal pain,no new weakness tingling or numbness, feels much better wants to go home today.   Objective:   Blood pressure (!) 118/55, pulse 96, temperature 98.5 F (36.9 C), temperature source Oral, resp. rate 18, height 5\' 3"  (1.6 m), weight 81 kg (178 lb  9.2 oz), last menstrual period 01/19/1992, SpO2 98 %.  Intake/Output Summary (Last 24 hours) at 02/03/16 1130 Last data filed at 02/03/16 0900  Gross per 24 hour  Intake             1967 ml  Output              215 ml  Net             1752 ml   Filed Weights   02/02/16 0657 02/02/16 1102 02/02/16 2046  Weight: 81 kg (178 lb 9.2 oz) 78.4 kg (172 lb 13.5 oz) 81 kg (178 lb 9.2 oz)    Exam: Awake Alert, Oriented *3, No new F.N deficits, Normal affect [place].AT,PERRAL Supple Neck,No JVD, No cervical lymphadenopathy appriciated.  Symmetrical Chest wall movement, Good air movement bilaterally, CTAB RRR,No Gallops,Rubs or new Murmurs, No Parasternal Heave +ve B.Sounds, Abd Soft, Non tender, No organomegaly appriciated, No rebound -guarding or rigidity. No Cyanosis, Clubbing or edema, No new Rash or bruise   PERTINENT RADIOLOGIC STUDIES: Dg Chest 2 View  Result Date: 01/30/2016 CLINICAL DATA:  Nausea and weakness EXAM: CHEST  2 VIEW COMPARISON:  01/27/2016 FINDINGS: Cardiac shadow is stable. Dialysis catheter is again seen. The lungs are well aerated bilaterally. Mild early infiltrate is noted in the left lung base. No bony abnormality is seen. IMPRESSION: Early left basilar infiltrate. Electronically Signed   By: Inez Catalina M.D.   On: 01/30/2016 18:23   Dg Chest 2 View  Result Date: 01/27/2016 CLINICAL DATA:  Dialysis pt with Lt anterior pleuritic chest pain pain, past hx of effusion in chest EXAM: CHEST  2 VIEW COMPARISON:  11/23/2015 FINDINGS: Cardiac silhouette is normal in size and configuration. Small hiatal hernia. No mediastinal or hilar masses or convincing adenopathy. Mild linear/reticular opacity at the left lung base. This most likely atelectasis. Stable. Lungs are otherwise clear. No pleural effusion or pneumothorax. Right anterior chest wall, internal jugular, dual-lumen central venous catheter is stable and well positioned. Bony thorax is demineralized but intact. IMPRESSION: 1. No  acute cardiopulmonary disease. 2. Small hiatal hernia. Electronically Signed   By: Lajean Manes M.D.   On: 01/27/2016 15:03   [name] Extrem Up Left Ltd  Result Date: 01/30/2016 CLINICAL DATA:  Soft tissue swelling at dialysis fistula site, check for possible abscess EXAM: ULTRASOUND left UPPER EXTREMITY LIMITED TECHNIQUE: Ultrasound examination of the upper extremity soft tissues was performed in the area of clinical concern. COMPARISON:  None FINDINGS: No fluid collection is identified to suggest focal abscess. The patient's known fistula is patent. No significant edema is noted. IMPRESSION: No evidence of focal abscess. Electronically Signed   By: Inez Catalina M.D.   On: 01/30/2016 20:25   Ir Nephrostomy Exchange Right  Result Date: 02/01/2016 INDICATION: Chronic right  ureteral obstruction, chronic right external nephrostomy, routine exchange EXAM: IR EXCHANGE NEPHROSTOMY RIGHT COMPARISON:  12/21/2015 MEDICATIONS: 1% lidocaine locally ANESTHESIA/SEDATION: None. CONTRAST:  10 cc Omnipaque 300 - administered into the collecting system(s) FLUOROSCOPY TIME:  Fluoroscopy Time: 1 minutes 6 seconds (7 mGy). COMPLICATIONS: None immediate. PROCEDURE: Informed written consent was obtained from the patient after a thorough discussion of the procedural risks, benefits and alternatives. All questions were addressed. Maximal Sterile Barrier Technique was utilized including caps, mask, sterile gowns, sterile gloves, sterile drape, hand hygiene and skin antiseptic. A timeout was performed prior to the initiation of the procedure. Under sterile conditions and local anesthesia, the existing right [place] nephrostomy catheter was cut and removed. A new 10 French catheter was advanced into the collecting system. The retention loop was formed within a mid to lower pole calyx. Position confirmed with contrast injection. There is new good urine output. Catheter secured with a StatLock device and connected to  external gravity drainage. No immediate complication. Patient tolerated the procedure well. IMPRESSION: Successful fluoroscopic exchange of the 10 French right nephrostomy (10 Leeroy Bock Bettey Mare catheter). Electronically Signed   By: Jerilynn Mages.  Shick M.D.   On: 02/01/2016 14:22     PERTINENT LAB RESULTS: CBC:  Recent Labs  02/01/16 0345 02/02/16 0700  WBC 8.2 8.1  HGB 8.7* 8.7*  HCT 28.6* 28.0*  PLT 167 196   CMET CMP     Component Value Date/Time   NA 138 02/03/2016 0550   K 4.1 02/03/2016 0550   CL 104 02/03/2016 0550   CO2 26 02/03/2016 0550   GLUCOSE 95 02/03/2016 0550   BUN 17 02/03/2016 0550   CREATININE 4.23 (H) 02/03/2016 0550   CALCIUM 8.6 (L) 02/03/2016 0550   CALCIUM 8.8 11/07/2015 1107   PROT 5.8 (L) 01/30/2016 1736   ALBUMIN 2.2 (L) 02/02/2016 0700   AST 36 01/30/2016 1736   ALT 25 01/30/2016 1736   ALKPHOS 82 01/30/2016 1736   BILITOT 0.7 01/30/2016 1736   GFRNONAA 10 (L) 02/03/2016 0550   GFRAA 11 (L) 02/03/2016 0550    GFR Estimated Creatinine Clearance: 12.3 mL/min (by C-G formula based on SCr of 4.23 mg/dL). No results for input(s): LIPASE, AMYLASE in the last 72 hours. No results for input(s): CKTOTAL, CKMB, CKMBINDEX, TROPONINI in the last 72 hours. Invalid input(s): POCBNP No results for input(s): DDIMER in the last 72 hours. No results for input(s): HGBA1C in the last 72 hours. No results for input(s): CHOL, HDL, LDLCALC, TRIG, CHOLHDL, LDLDIRECT in the last 72 hours. No results for input(s): TSH, T4TOTAL, T3FREE, THYROIDAB in the last 72 hours.  Invalid input(s): FREET3 No results for input(s): VITAMINB12, FOLATE, FERRITIN, TIBC, IRON, RETICCTPCT in the last 72 hours. Coags:  Recent Labs  02/02/16 1216 02/03/16 0550  INR 2.05 2.42   Microbiology: Recent Results (from the past 240 hour(s))  Culture, blood (Routine x 2)     Status: None (Preliminary result)   Collection Time: 01/30/16  5:18 PM  Result Value Ref Range Status   Specimen  Description BLOOD RIGHT ANTECUBITAL  Final   Special Requests IN PEDIATRIC BOTTLE 3CC  Final   Culture NO GROWTH 3 DAYS  Final   Report Status PENDING  Incomplete  Culture, blood (Routine x 2)     Status: None (Preliminary result)   Collection Time: 01/30/16  5:28 PM  Result Value Ref Range Status   Specimen Description BLOOD RIGHT HAND  Final   Special Requests BOTTLES DRAWN AEROBIC  AND ANAEROBIC 5CC  Final   Culture NO GROWTH 3 DAYS  Final   Report Status PENDING  Incomplete  Urine culture     Status: Abnormal   Collection Time: 01/30/16  5:45 PM  Result Value Ref Range Status   Specimen Description URINE, CATHETERIZED  Final   Special Requests NONE  Final   Culture 20,000 COLONIES/mL PROTEUS MIRABILIS (A)  Final   Report Status 02/01/2016 FINAL  Final   Organism ID, Bacteria PROTEUS MIRABILIS (A)  Final      Susceptibility   Proteus mirabilis - MIC*    AMPICILLIN <=2 SENSITIVE Sensitive     CEFAZOLIN <=4 SENSITIVE Sensitive     CEFTRIAXONE <=1 SENSITIVE Sensitive     CIPROFLOXACIN <=0.25 SENSITIVE Sensitive     GENTAMICIN 2 SENSITIVE Sensitive     IMIPENEM 2 SENSITIVE Sensitive     NITROFURANTOIN 64 RESISTANT Resistant     TRIMETH/SULFA <=20 SENSITIVE Sensitive     AMPICILLIN/SULBACTAM <=2 SENSITIVE Sensitive     PIP/TAZO <=4 SENSITIVE Sensitive     * 20,000 COLONIES/mL PROTEUS MIRABILIS  MRSA PCR Screening     Status: Abnormal   Collection Time: 01/31/16  2:29 PM  Result Value Ref Range Status   MRSA by PCR POSITIVE (A) NEGATIVE Final    Comment:        The GeneXpert MRSA Assay (FDA approved for NASAL specimens only), is one component of a comprehensive MRSA colonization surveillance program. It is not intended to diagnose MRSA infection nor to guide or monitor treatment for MRSA infections. RESULT CALLED TO, READ BACK BY AND VERIFIED WITH: S.ADAMS RN AT 1652 01/31/16 BY A.DAVIS     FURTHER DISCHARGE INSTRUCTIONS:  Get Medicines reviewed and adjusted: Please  take all your medications with you for your next visit with your Primary MD  Laboratory/radiological data: Please request your Primary MD to go over all hospital tests and procedure/radiological results at the follow up, please ask your Primary MD to get all Hospital records sent to his/her office.  In some cases, they will be blood work, cultures and biopsy results pending at the time of your discharge. Please request that your primary care M.D. goes through all the records of your hospital data and follows up on these results.  Also Note the following: If you experience worsening of your admission symptoms, develop shortness of breath, life threatening emergency, suicidal or homicidal thoughts you must seek medical attention immediately by calling 911 or calling your MD immediately  if symptoms less severe.  You must read complete instructions/literature along with all the possible adverse reactions/side effects for all the Medicines you take and that have been prescribed to you. Take any new Medicines after you have completely understood and accpet all the possible adverse reactions/side effects.   Do not drive when taking Pain medications or sleeping medications (Benzodaizepines)  Do not take more than prescribed Pain, Sleep and Anxiety Medications. It is not advisable to combine anxiety,sleep and pain medications without talking with your primary care practitioner  Special Instructions: If you have smoked or chewed Tobacco  in the last 2 yrs please stop smoking, stop any regular Alcohol  and or any Recreational drug use.  Wear Seat belts while driving.  Please note: You were cared for by a hospitalist during your hospital stay. Once you are discharged, your primary care physician will handle any further medical issues. Please note that NO REFILLS for any discharge medications will be authorized once you are discharged, as  it is imperative that you return to your primary care physician (or  establish a relationship with a primary care physician if you do not have one) for your post hospital discharge needs so that they can reassess your need for medications and monitor your lab values.  Total Time spent coordinating discharge including counseling, education and face to face time equals 45 minutes.  SignedOren Binet 02/03/2016 11:30 AM

## 2016-02-03 NOTE — Progress Notes (Signed)
Admit: 01/30/2016 LOS: 4  49F ESRD [facility] longstanding urological issues admit with fever, chills, malaise.    Subjective:  HD yesterday, 2.5L UF; tol well Feels great this AM B Cx 8/12 NGTD Afebrile   08/15 0701 - 08/16 0700 In: 1977 [P.O.:1527; NG/GT:200; IV Piggyback:250] Out: 2715 [Urine:215]  Filed Weights   02/02/16 0657 02/02/16 1102 02/02/16 2046  Weight: 81 kg (178 lb 9.2 oz) 78.4 kg (172 lb 13.5 oz) 81 kg (178 lb 9.2 oz)    Scheduled Meds: . amiodarone  200 mg Oral Daily  . famotidine  20 mg Oral Daily  . latanoprost  1 drop Both Eyes QHS  . multivitamin with minerals  1 tablet Oral Daily  . mupirocin ointment  1 application Nasal BID  . piperacillin-tazobactam (ZOSYN)  IV  3.375 g Intravenous Q12H  . saccharomyces boulardii  250 mg Oral BID  . timolol  1 drop Both Eyes q morning - 10a  . vancomycin  750 mg Intravenous Q T,Th,Sa-HD  . warfarin  2.5 mg Oral ONCE-1800  . Warfarin - Pharmacist Dosing Inpatient   Does not apply q1800   Continuous Infusions:  PRN Meds:.acetaminophen, feeding supplement (NEPRO CARB STEADY), sodium chloride  Current Labs: reviewed  8/12 UCx pending 8/12 B Cx x2 pending   Physical Exam:  Blood pressure (!) 113/53, pulse 80, temperature 97.5 F (36.4 C), temperature source Oral, resp. rate 18, height 5\' 3"  (1.6 m), weight 81 kg (178 lb 9.2 oz), last menstrual period 01/19/1992, SpO2 98 %. no distress, chron ill appearing No rash, cyanosis or gangrene Sclera anicteric, throat clear  No jvd or bruits Chest clear bilat RRR no MRG Abd soft ntnd no mass or ascites +bs +obese GU - has R flank perc neph, clean exit site, no urine in bag MS no joint effusions or deformit Ext no LE edema / no wounds or ulcers Neuro is alert, Ox 3 , nf LUA AVF +bruit, moderately mature; R IJ cath clean exit site  Dialysis: T[facility]  4h 57min  79kg  2/2 bath  Hep 2200  LUA AVF (Feb/ Apr 2017 #1/#2 stage BVT) / R IJ TDC Venofer 50/wk, no esa, no  binder Calc 0.5 ug Comp- good BP - normal Getting to EDW - yes   A 1. Fevers suspected UTI, Low level proteus in urine, Inpt B Cx NGTD but prior had rec Vanc at end of Tx 8/12 2. Chronic R Hydro, chronic PCN + ureteral stent; PCN exchanged during admission 3. ESRD [facility], using TDC, maturing AVF 4. AFib  5. Leukocytosis, resolved 6. Anemia, not on ESA as outpt  P 1. Stable / improved 2. Start ESA next HD Tx 3. HD tomorrow, 3K, EDW 78.5kg, tight hep, TDC   Pearson Grippe MD 02/03/2016, 9:26 AM   Recent Labs Lab 02/01/16 0345 02/02/16 0700 02/03/16 0550  NA 135 136 138  K 3.4* 3.2* 4.1  CL 98* 99* 104  CO2 26 26 26   GLUCOSE 95 100* 95  BUN 25* 35* 17  CREATININE 5.83* 7.37* 4.23*  CALCIUM 8.1* 8.5* 8.6*  PHOS 3.2 3.1  --     Recent Labs Lab 01/30/16 1736 01/31/16 0452 02/01/16 0345 02/02/16 0700  WBC 14.2* 10.1 8.2 8.1  NEUTROABS 12.9*  --  7.2  --   HGB 9.3* 10.2* 8.7* 8.7*  HCT 30.3* 34.2* 28.6* 28.0*  MCV 88.3 91.2 88.0 88.3  PLT 184 175 167 196

## 2016-02-04 LAB — CULTURE, BLOOD (ROUTINE X 2)
Culture: NO GROWTH
Culture: NO GROWTH

## 2016-02-05 ENCOUNTER — Other Ambulatory Visit (HOSPITAL_COMMUNITY): Payer: Self-pay | Admitting: Urology

## 2016-02-05 DIAGNOSIS — N1330 Unspecified hydronephrosis: Secondary | ICD-10-CM | POA: Diagnosis not present

## 2016-02-05 DIAGNOSIS — N184 Chronic kidney disease, stage 4 (severe): Secondary | ICD-10-CM | POA: Diagnosis not present

## 2016-02-05 DIAGNOSIS — N135 Crossing vessel and stricture of ureter without hydronephrosis: Secondary | ICD-10-CM

## 2016-02-05 DIAGNOSIS — N3020 Other chronic cystitis without hematuria: Secondary | ICD-10-CM | POA: Diagnosis not present

## 2016-02-05 DIAGNOSIS — Z87442 Personal history of urinary calculi: Secondary | ICD-10-CM | POA: Diagnosis not present

## 2016-02-08 ENCOUNTER — Ambulatory Visit (INDEPENDENT_AMBULATORY_CARE_PROVIDER_SITE_OTHER): Payer: Medicare Other

## 2016-02-08 DIAGNOSIS — N186 End stage renal disease: Secondary | ICD-10-CM | POA: Diagnosis not present

## 2016-02-08 DIAGNOSIS — I480 Paroxysmal atrial fibrillation: Secondary | ICD-10-CM

## 2016-02-08 DIAGNOSIS — I129 Hypertensive chronic kidney disease with stage 1 through stage 4 chronic kidney disease, or unspecified chronic kidney disease: Secondary | ICD-10-CM | POA: Diagnosis not present

## 2016-02-08 DIAGNOSIS — Z5181 Encounter for therapeutic drug level monitoring: Secondary | ICD-10-CM

## 2016-02-08 DIAGNOSIS — N3000 Acute cystitis without hematuria: Secondary | ICD-10-CM | POA: Diagnosis not present

## 2016-02-08 DIAGNOSIS — I4891 Unspecified atrial fibrillation: Secondary | ICD-10-CM

## 2016-02-08 LAB — POCT INR: INR: 2.2

## 2016-02-16 ENCOUNTER — Other Ambulatory Visit: Payer: Self-pay

## 2016-02-16 MED ORDER — WARFARIN SODIUM 5 MG PO TABS: ORAL_TABLET | ORAL | 3 refills | Status: DC

## 2016-02-18 DIAGNOSIS — Z992 Dependence on renal dialysis: Secondary | ICD-10-CM | POA: Diagnosis not present

## 2016-02-18 DIAGNOSIS — I129 Hypertensive chronic kidney disease with stage 1 through stage 4 chronic kidney disease, or unspecified chronic kidney disease: Secondary | ICD-10-CM | POA: Diagnosis not present

## 2016-02-18 DIAGNOSIS — N186 End stage renal disease: Secondary | ICD-10-CM | POA: Diagnosis not present

## 2016-02-19 ENCOUNTER — Ambulatory Visit (HOSPITAL_COMMUNITY)
Admission: RE | Admit: 2016-02-19 | Discharge: 2016-02-19 | Disposition: A | Discharge status: Home / Self Care | Payer: Medicare Other | Source: Ambulatory Visit | Attending: Urology | Admitting: Urology

## 2016-02-19 ENCOUNTER — Telehealth: Payer: Self-pay | Admitting: Interventional Cardiology

## 2016-02-19 DIAGNOSIS — N135 Crossing vessel and stricture of ureter without hydronephrosis: Secondary | ICD-10-CM

## 2016-02-19 NOTE — Discharge Instructions (Signed)
Incision Care °An incision is when a surgeon cuts into your body. After surgery, the incision needs to be cared for properly to prevent infection.  °HOW TO CARE FOR YOUR INCISION °· Take medicines only as directed by your health care provider. °· There are many different ways to close and cover an incision, including stitches, skin glue, and adhesive strips. Follow your health care provider's instructions on: °¨ Incision care. °¨ Bandage (dressing) changes and removal. °¨ Incision closure removal. °· Do not take baths, swim, or use a hot tub until your health care provider approves. You may shower as directed by your health care provider. °· Resume your normal diet and activities as directed. °· Use anti-itch medicine (such as an antihistamine) as directed by your health care provider. The incision may itch while it is healing. Do not pick or scratch at the incision. °· Drink enough fluid to keep your urine clear or pale yellow. °SEEK MEDICAL CARE IF:  °· You have drainage, redness, swelling, or pain at your incision site. °· You have muscle aches, chills, or a general ill feeling. °· You notice a bad smell coming from the incision or dressing. °· Your incision edges separate after the sutures, staples, or skin adhesive strips have been removed. °· You have persistent nausea or vomiting. °· You have a fever. °· You are dizzy. °SEEK IMMEDIATE MEDICAL CARE IF:  °· You have a rash. °· You faint. °· You have difficulty breathing. °MAKE SURE YOU:  °· Understand these instructions. °· Will watch your condition. °· Will get help right away if you are not doing well or get worse. °  °This information is not intended to replace advice given to you by your health care provider. Make sure you discuss any questions you have with your health care provider. °  °Document Released: 12/24/2004 Document Revised: 06/27/2014 Document Reviewed: 07/31/2013 °Elsevier Interactive Patient Education ©2016 [employer] ° °

## 2016-02-19 NOTE — Telephone Encounter (Signed)
PT  STATED SHE   WAS  CALLED TO  CONFIRM  UPCOMING APPTS. PT  AWARE TO BE  HERE  AT  11:00 ON 02-24-16  ./CY

## 2016-02-19 NOTE — Telephone Encounter (Signed)
New message ° ° ° °Pt verbalized that she is returning call  °

## 2016-02-20 DIAGNOSIS — R51 Headache: Secondary | ICD-10-CM | POA: Diagnosis not present

## 2016-02-20 DIAGNOSIS — Z23 Encounter for immunization: Secondary | ICD-10-CM | POA: Diagnosis not present

## 2016-02-20 DIAGNOSIS — T8249XA Other complication of vascular dialysis catheter, initial encounter: Secondary | ICD-10-CM | POA: Diagnosis not present

## 2016-02-20 DIAGNOSIS — R52 Pain, unspecified: Secondary | ICD-10-CM | POA: Diagnosis not present

## 2016-02-20 DIAGNOSIS — D509 Iron deficiency anemia, unspecified: Secondary | ICD-10-CM | POA: Diagnosis not present

## 2016-02-20 DIAGNOSIS — D631 Anemia in chronic kidney disease: Secondary | ICD-10-CM | POA: Diagnosis not present

## 2016-02-20 DIAGNOSIS — N390 Urinary tract infection, site not specified: Secondary | ICD-10-CM | POA: Diagnosis not present

## 2016-02-20 DIAGNOSIS — I482 Chronic atrial fibrillation: Secondary | ICD-10-CM | POA: Diagnosis not present

## 2016-02-20 DIAGNOSIS — D688 Other specified coagulation defects: Secondary | ICD-10-CM | POA: Diagnosis not present

## 2016-02-20 DIAGNOSIS — N186 End stage renal disease: Secondary | ICD-10-CM | POA: Diagnosis not present

## 2016-02-23 ENCOUNTER — Encounter: Payer: Self-pay | Admitting: Interventional Cardiology

## 2016-02-23 DIAGNOSIS — N390 Urinary tract infection, site not specified: Secondary | ICD-10-CM | POA: Diagnosis not present

## 2016-02-23 DIAGNOSIS — R51 Headache: Secondary | ICD-10-CM | POA: Diagnosis not present

## 2016-02-23 DIAGNOSIS — T8249XA Other complication of vascular dialysis catheter, initial encounter: Secondary | ICD-10-CM | POA: Diagnosis not present

## 2016-02-23 DIAGNOSIS — D509 Iron deficiency anemia, unspecified: Secondary | ICD-10-CM | POA: Diagnosis not present

## 2016-02-23 DIAGNOSIS — N186 End stage renal disease: Secondary | ICD-10-CM | POA: Diagnosis not present

## 2016-02-23 DIAGNOSIS — R52 Pain, unspecified: Secondary | ICD-10-CM | POA: Diagnosis not present

## 2016-02-23 DIAGNOSIS — D688 Other specified coagulation defects: Secondary | ICD-10-CM | POA: Diagnosis not present

## 2016-02-23 DIAGNOSIS — Z23 Encounter for immunization: Secondary | ICD-10-CM | POA: Diagnosis not present

## 2016-02-23 DIAGNOSIS — I482 Chronic atrial fibrillation: Secondary | ICD-10-CM | POA: Diagnosis not present

## 2016-02-23 DIAGNOSIS — D631 Anemia in chronic kidney disease: Secondary | ICD-10-CM | POA: Diagnosis not present

## 2016-02-24 ENCOUNTER — Other Ambulatory Visit: Payer: Medicare Other | Admitting: *Deleted

## 2016-02-24 ENCOUNTER — Other Ambulatory Visit (HOSPITAL_COMMUNITY): Payer: Self-pay | Admitting: Urology

## 2016-02-24 ENCOUNTER — Ambulatory Visit (INDEPENDENT_AMBULATORY_CARE_PROVIDER_SITE_OTHER): Payer: Medicare Other | Admitting: Pharmacist

## 2016-02-24 ENCOUNTER — Ambulatory Visit (INDEPENDENT_AMBULATORY_CARE_PROVIDER_SITE_OTHER): Payer: Medicare Other | Admitting: Interventional Cardiology

## 2016-02-24 ENCOUNTER — Ambulatory Visit (HOSPITAL_COMMUNITY)
Admission: RE | Admit: 2016-02-24 | Discharge: 2016-02-24 | Disposition: A | Discharge status: Home / Self Care | Payer: Medicare Other | Source: Ambulatory Visit | Attending: Urology | Admitting: Urology

## 2016-02-24 ENCOUNTER — Encounter: Payer: Self-pay | Admitting: Interventional Cardiology

## 2016-02-24 VITALS — BP 120/60 | HR 83 | Ht 63.0 in | Wt 177.4 lb

## 2016-02-24 DIAGNOSIS — Z79899 Other long term (current) drug therapy: Secondary | ICD-10-CM | POA: Diagnosis not present

## 2016-02-24 DIAGNOSIS — I9581 Postprocedural hypotension: Secondary | ICD-10-CM

## 2016-02-24 DIAGNOSIS — N135 Crossing vessel and stricture of ureter without hydronephrosis: Secondary | ICD-10-CM

## 2016-02-24 DIAGNOSIS — I480 Paroxysmal atrial fibrillation: Secondary | ICD-10-CM

## 2016-02-24 DIAGNOSIS — Z5181 Encounter for therapeutic drug level monitoring: Secondary | ICD-10-CM | POA: Diagnosis not present

## 2016-02-24 DIAGNOSIS — I4891 Unspecified atrial fibrillation: Secondary | ICD-10-CM

## 2016-02-24 DIAGNOSIS — N186 End stage renal disease: Secondary | ICD-10-CM | POA: Diagnosis not present

## 2016-02-24 DIAGNOSIS — I10 Essential (primary) hypertension: Secondary | ICD-10-CM | POA: Diagnosis not present

## 2016-02-24 HISTORY — PX: IR GENERIC HISTORICAL: IMG1180011

## 2016-02-24 LAB — HEPATIC FUNCTION PANEL
ALT: 6 U/L (ref 6–29)
AST: 13 U/L (ref 10–35)
Albumin: 3.6 g/dL (ref 3.6–5.1)
Alkaline Phosphatase: 82 U/L (ref 33–130)
Bilirubin, Direct: 0.1 mg/dL (ref <=0.2)
Indirect Bilirubin: 0.3 mg/dL (ref 0.2–1.2)
Total Bilirubin: 0.4 mg/dL (ref 0.2–1.2)
Total Protein: 6.2 g/dL (ref 6.1–8.1)

## 2016-02-24 LAB — TSH: TSH: 2.77 mIU/L

## 2016-02-24 LAB — POCT INR: INR: 1.7

## 2016-02-24 NOTE — Progress Notes (Signed)
Cardiology Office Note   Date:  02/24/2016   ID:  [name], DOB 02/17/1946, MRN [id number]  PCP:  [name], MD    No chief complaint on file. f/u PAF   Wt Readings from Last 3 Encounters:  02/02/16 178 lb 9.2 oz (81 kg)  12/21/15 172 lb (78 kg)  11/30/15 174 lb (78.9 kg)       History of Present Illness: [name] is a 70 y.o. female  with a hx of Dilated cardiomyopathy with EF 20-25% in the setting of sepsis in 0000000 complicated by demand ischemia and transient atrial fibrillation. She had an abnormal stress test that prompted cardiac catheterization which demonstrated normal coronary arteries in 2014. Other history includes HL, HTN, diastolic HF, ESRD on hemodialysis, recurrent sepsis status post percutaneous nephrostomy, chronic anemia.  She progressed to ESRD this year. She has had multiple admissions in 2017, most often for acute UTI/pyelo with sepsis. She was seen by cardiology during her last admission 5/12-5/24 for atrial fib with RVR. She was treated with amiodarone - initially did not convert but per Dr. Lysbeth Penner last note she had converted to NSR and amiodarone was stopped. Metoprolol was titrated.  2D Echo 11/03/15: mild LVH, EF 55-60%, grade 1 DD, severely dilated LA, mild TR, PASP 42.   Admitted 6/2-11/23/15 with chest pain.  CXR showed moderate L pleural effusion and trace R pleural effusion new from prior. CT without contrast showed left lung base consolidation with layering pleural effusion favored to represent pneumonia, with small but intermediate density pericardial effusion new since November (consider pericarditis), chronic large gastric hiatal hernia, calcified coronary atherosclerosis. Albumin was 2.3. Cardiac enzymes were negative. D-dimer was elevated at 3.25. Patient was treated with antibiotics and underwent left thoracentesis. She was followed by pulmonology. Pleural effusion was transudative.  She developed AF with RVR in the hospital and  was seen by cardiology.  Repeat echo demonstrated normal LV function and possible trivial pericardial effusion. She was loaded with amiodarone for an attempt at rhythm control.  She returned to NSR.   She saw [name] a few months ago and was doing well.  Since that visit, her effusion has cleared.  SHe has hypotension at the end of dialysis treatment.  Metoprolol was decreased. No bleeding issues on Coumadin.  No AFib sx.  Tolerating Amiodarone.    Past Medical History:  Diagnosis Date  . Cardiomyopathy ([place])    a. h/o LV dysfunction EF 20-25% in 2013 due to sepsis.>> improved to normal   . Chronic diastolic CHF (congestive heart failure) ([place])    10/ 2013 in setting of septic shock  . Complication of anesthesia    use a little anesthesia , per patient MD states she quit breathing (2016); hard to wake up  . ESRD (end stage renal disease) ([place])    dialysis Tues, Thurs, Sat  . GERD (gastroesophageal reflux disease)   . Glaucoma   . Glaucoma   . H/O hiatal hernia    a. CT 2017: large gastric hiatal hernia.  Marland Kitchen Headache(784.0)    migraine  . History of blood transfusion 04/13/2015   . History of echocardiogram    a. Echo 6/17: EF 60-65%, normal wall motion, mild MR, atrial septal lipomatous hypertrophy, PASP 34 mmHg, possible trivial free-flowing pericardial effusion along RV free wall // b. Echo 5/17: Mild LVH, EF 55-60%, normal wall motion, grade 1 diastolic dysfunction, trivial MR, severe LAE, mild RAE, PASP 42 mmHg  . History  of nephrostomy 04/11/2015    currently inplace 04/28/2015   . History of nuclear stress test    a. Myoview 1/14 - Marked ischemia in the basal anterior, mid anterior, apical septal and apical inferior regions, EF 63% >> LHC normal   . Hyperlipidemia   . Hypertension    medication removed from regimen due to low blood pressure   . Iron deficiency    hx  . Nephrolithiasis 2002, 2006   bilateral  . Normal coronary arteries 2014   a. LHC in 1/14: normal  coronary arteries  . PAF (paroxysmal atrial fibrillation) ([place])    a. 10/ 2013  in setting of Septic Shock //  b. recurrent during admit for pneumonia, L effusion >> placed on Amiodarone // Coumadin for anticoagulation  . Pneumonia    dx 10-06-2014 per CXR--  on 10-27-2014 pt states finished antibiotic and denies cough or fever  . S/P hemodialysis catheter insertion ([place]) 04/11/2015    right anterior chest , only used once   . Sigmoid diverticulosis     Past Surgical History:  Procedure Laterality Date  . ABDOMINAL HYSTERECTOMY    . AV FISTULA PLACEMENT Left 06/02/2015   Procedure: BRACHIOCEPHALIC ARTERIOVENOUS (AV) FISTULA CREATION ;  Surgeon: Conrad Moodus, MD;  Location: [facility];  Service: Vascular;  Laterality: Left;  . BASCILIC VEIN TRANSPOSITION Left 07/27/2015   Procedure: FIRST STAGE BASILIC VEIN TRANSPOSITION LEFT UPPER ARM;  Surgeon: Conrad Mackville, MD;  Location: [facility];  Service: Vascular;  Laterality: Left;  . BASCILIC VEIN TRANSPOSITION Left 09/2015   second phase  . BASCILIC VEIN TRANSPOSITION Left 10/12/2015   Procedure: SECOND STAGE BASILIC VEIN TRANSPOSITION LEFT ARM;  Surgeon: Conrad New River, MD;  Location: [facility];  Service: Vascular;  Laterality: Left;  . BREAST BIOPSY Left 08/23/07   benign fibrocystic with duct ectasia  . CARDIAC CATHETERIZATION  07-11-2012  dr Irish Lack   Abnormal stress test/   normal coronary arteries/  LVEDP  37mmHg  . CARDIOVASCULAR STRESS TEST  06-26-2012  dr Irish Lack   marked ischemia in the basal anterior, mid anterior, apical inferior regions/  normal LVF, ef 63%  . CATARACT EXTRACTION W/ INTRAOCULAR LENS  IMPLANT, BILATERAL    . CYSTOSCOPY W/ URETERAL STENT PLACEMENT  04/04/2012   Procedure: CYSTOSCOPY WITH RETROGRADE PYELOGRAM/URETERAL STENT PLACEMENT;  Surgeon: Ailene Rud, MD;  Location: [facility];  Service: Urology;  Laterality: Left;  . CYSTOSCOPY W/ URETERAL STENT PLACEMENT Bilateral 05/04/2015   Procedure: CYSTOSCOPY WITH BILATERAL  RETROGRADE PYELOGRAM/ WITH INTERPRETATION, EXCHANGE OF RIGHT URETERAL STENT REPLACEMENT AND PLACEMENT LEFT URETERAL STENT PLACEMENT EXAMINATION OF VAGINA;  Surgeon: Carolan Clines, MD;  Location: [facility];  Service: Urology;  Laterality: Bilateral;  . CYSTOSCOPY WITH STENT PLACEMENT Right 10/28/2014   Procedure: RIGHT URETERAL STENT PLACEMENT;  Surgeon: Irine Seal, MD;  [facility];  Service: Urology;  Laterality: Right;  . CYSTOSCOPY WITH STENT PLACEMENT Right 02/26/2015   Procedure: CYSTOSCOPY RETROGRADE PYELOGRAM WITH STENT PLACEMENT;  Surgeon: Cleon Gustin, MD;  Location: [facility];  Service: Urology;  Laterality: Right;  . CYSTOSCOPY/RETROGRADE/URETEROSCOPY/STONE EXTRACTION WITH BASKET Right 11/21/2014   Procedure: CYSTOSCOPY/RIGHT RETROGRADE PYELOGRAM/RIGHT URETEROSCOPY/BASKET EXTRACTION/RIGHT PYELOSCOPY/LASER OF STONE/RIGHT DOUBLE J STENT;  Surgeon: Carolan Clines, MD;  Location: [facility];  Service: Urology;  Laterality: Right;  . EXTRACORPOREAL SHOCK WAVE LITHOTRIPSY  05-28-2012  &  10-08-2012  . HOLMIUM LASER APPLICATION Right [id number]   Procedure: HOLMIUM LASER APPLICATION;  Surgeon: Carolan Clines, MD;  Location: [facility];  Service: Urology;  Laterality: Right;  . IR GENERIC HISTORICAL  02/01/2016   IR NEPHROSTOMY EXCHANGE RIGHT 02/01/2016 Greggory Keen, MD [facility]  . KNEE ARTHROSCOPY Left 02-14-2003  . LAPAROSCOPIC CHOLECYSTECTOMY  03-23-2005  . TOTAL ABDOMINAL HYSTERECTOMY W/ BILATERAL SALPINGOOPHORECTOMY  1993   secondary to fibroids  . TRANSTHORACIC ECHOCARDIOGRAM  04-09-2012   normal LVF,  ef 60-65%,  mild LAE,  mild TR, trivial MR and PR     Current Outpatient Prescriptions  Medication Sig Dispense Refill  . acetaminophen (TYLENOL) 500 MG tablet Take 1,000 mg by mouth every 6 (six) hours as needed for moderate pain or headache.     Marland Kitchen amiodarone (PACERONE) 200 MG tablet Take 1 tablet (200 mg total) by  mouth daily. 30 tablet 11  . bimatoprost (LUMIGAN) 0.01 % SOLN Place 1 drop into both eyes at bedtime.    . calcium acetate (PHOSLO) 667 MG capsule Take 667 mg by mouth 3 (three) times daily with meals.    . metoprolol tartrate (LOPRESSOR) 25 MG tablet Take 1 tablet (25 mg total) by mouth 2 (two) times daily. (Patient taking differently: Take 12.5 mg by mouth 2 (two) times daily. ) 60 tablet 3  . Multiple Vitamins-Minerals (MULTIVITAMIN WITH MINERALS) tablet Take 1 tablet by mouth daily.    . Nutritional Supplements (FEEDING SUPPLEMENT, NEPRO CARB STEADY,) LIQD Take 237 mLs by mouth daily. (Patient taking differently: Take 237 mLs by mouth daily as needed (nutritional supplement). ) 30 Can 0  . ranitidine (ZANTAC) 150 MG tablet Take 150 mg by mouth 2 (two) times daily as needed for heartburn.    . saccharomyces boulardii (FLORASTOR) 250 MG capsule Take 1 capsule (250 mg total) by mouth 2 (two) times daily. 60 capsule 0  . sodium chloride (OCEAN) 0.65 % SOLN nasal spray Place 1 spray into both nostrils 3 (three) times daily as needed for congestion.    . timolol (BETIMOL) 0.5 % ophthalmic solution Place 1 drop into both eyes every morning.     . warfarin (COUMADIN) 5 MG tablet Take as directed by [facility] 30 tablet 3   No current facility-administered medications for this visit.     Allergies:   Vicodin [hydrocodone-acetaminophen]; Chlorhexidine; and Percocet [oxycodone-acetaminophen]    Social History:  The patient  reports that she has never smoked. She has never used smokeless tobacco. She reports that she does not drink alcohol or use drugs.   Family History:  The patient's family history includes Bladder Cancer in her maternal grandfather; Cancer (age of onset: 13) in her father; Cancer (age of onset: 58) in her mother; Dementia in her mother; Diabetes in her brother; Heart disease in her brother; Heart failure in her paternal grandmother; Hypertension in her brother and mother.     ROS:  Please see the history of present illness.   Otherwise, review of systems are positive for recurrent UTI.   All other systems are reviewed and negative.    PHYSICAL EXAM: VS:  LMP 01/19/1992 (Approximate)  , BMI There is no height or weight on file to calculate BMI. GEN: Well nourished, well developed, in no acute distress  HEENT: normal  Neck: no JVD, carotid bruits, or masses Cardiac: RRR; no murmurs, rubs, or gallops,no edema  Respiratory:  clear to auscultation bilaterally, normal work of breathing GI: soft, nontender, nondistended, + BS MS: no deformity or atrophy  Skin: warm and dry, no rash Neuro:  Strength and sensation are intact Psych:  euthymic mood, full affect     Recent Labs: 05/25/2015: Magnesium 1.6 07/04/2015: B Natriuretic Peptide 183.0 11/05/2015: TSH 1.242 01/30/2016: ALT 25 02/02/2016: Hemoglobin 8.7; Platelets 196 02/03/2016: BUN 17; Creatinine, Ser 4.23; Potassium 4.1; Sodium 138   Lipid Panel    Component Value Date/Time   CHOL 146 11/20/2015 1302   TRIG 84 07/04/2015 1014   HDL 43 07/04/2015 1014   CHOLHDL 2.9 07/04/2015 1014   VLDL 17 07/04/2015 1014   LDLCALC 63 07/04/2015 1014     Other studies Reviewed: Additional studies/ records that were reviewed today with results demonstrating: Normal LV function in June 2017.   ASSESSMENT AND PLAN:  1. PAF: Maintaining NSR.  She needs to have her TSH and LFTs checked every 6 months due to Amio.  Hopefully this can be one at dialysis.   Will send to Dr. Jimmy Footman. 2. HTN: Blood pressure well controlled. She was having low blood pressure at the end of dialysis. Have asked her to not take metoprolol on the morning she has dialysis. She can take the evening dose. Hopefully, this will help control her blood pressure. 3. ESRD: She gets dialysis on Tuesday Thursdays and Saturday.  Still has had repeat urinary tract infections. Various medical devices will be removed to try to decrease the chance of  infection.   Current medicines are reviewed at length with the patient today.  The patient concerns regarding her medicines were addressed.  The following changes have been made:  No change  Labs/ tests ordered today include:  No orders of the defined types were placed in this encounter.   Recommend 150 minutes/week of aerobic exercise Low fat, low carb, high fiber diet recommended  Disposition:   FU in 1 year   Signed, Larae Grooms, MD  02/24/2016 1:09 AM    [facility] [address], [place], [place]  [address] Phone: [phone]; Fax: [phone]

## 2016-02-24 NOTE — Procedures (Signed)
Patient came to IR to remove right nephrostomy tube.  Patient states that JJ stent has previously been removed.  Right PCN was cut and removed without complication.  Gauze tegaderm dressing applied.  Patient instructed to wait 24 hours to shower and change bandage as needed.  Patient instructed to not submerge PCN site under water until after the skin site has completely healed.

## 2016-02-24 NOTE — Patient Instructions (Signed)
Medication Instructions:  Do not take Metoprolol on days that you have dialysis. All other medications remain the same.  Labwork: None  Testing/Procedures: None  Follow-Up: Your physician wants you to follow-up in: 1 year. You will receive a reminder letter in the mail two months in advance. If you don't receive a letter, please call our office to schedule the follow-up appointment.    If you need a refill on your cardiac medications before your next appointment, please call your pharmacy.

## 2016-02-26 DIAGNOSIS — Z452 Encounter for adjustment and management of vascular access device: Secondary | ICD-10-CM | POA: Diagnosis not present

## 2016-03-09 ENCOUNTER — Ambulatory Visit (INDEPENDENT_AMBULATORY_CARE_PROVIDER_SITE_OTHER): Payer: Medicare Other | Admitting: *Deleted

## 2016-03-09 DIAGNOSIS — I480 Paroxysmal atrial fibrillation: Secondary | ICD-10-CM | POA: Diagnosis not present

## 2016-03-09 DIAGNOSIS — I4891 Unspecified atrial fibrillation: Secondary | ICD-10-CM | POA: Diagnosis not present

## 2016-03-09 DIAGNOSIS — Z5181 Encounter for therapeutic drug level monitoring: Secondary | ICD-10-CM

## 2016-03-09 LAB — POCT INR: INR: 2

## 2016-03-10 DIAGNOSIS — R52 Pain, unspecified: Secondary | ICD-10-CM | POA: Insufficient documentation

## 2016-03-19 DIAGNOSIS — N186 End stage renal disease: Secondary | ICD-10-CM | POA: Diagnosis not present

## 2016-03-19 DIAGNOSIS — I129 Hypertensive chronic kidney disease with stage 1 through stage 4 chronic kidney disease, or unspecified chronic kidney disease: Secondary | ICD-10-CM | POA: Diagnosis not present

## 2016-03-19 DIAGNOSIS — Z992 Dependence on renal dialysis: Secondary | ICD-10-CM | POA: Diagnosis not present

## 2016-03-22 DIAGNOSIS — D509 Iron deficiency anemia, unspecified: Secondary | ICD-10-CM | POA: Diagnosis not present

## 2016-03-22 DIAGNOSIS — N186 End stage renal disease: Secondary | ICD-10-CM | POA: Diagnosis not present

## 2016-03-22 DIAGNOSIS — D688 Other specified coagulation defects: Secondary | ICD-10-CM | POA: Diagnosis not present

## 2016-03-22 DIAGNOSIS — I482 Chronic atrial fibrillation: Secondary | ICD-10-CM | POA: Diagnosis not present

## 2016-03-22 DIAGNOSIS — R51 Headache: Secondary | ICD-10-CM | POA: Diagnosis not present

## 2016-03-22 DIAGNOSIS — R52 Pain, unspecified: Secondary | ICD-10-CM | POA: Diagnosis not present

## 2016-03-25 ENCOUNTER — Ambulatory Visit (INDEPENDENT_AMBULATORY_CARE_PROVIDER_SITE_OTHER): Payer: Medicare Other | Admitting: *Deleted

## 2016-03-25 DIAGNOSIS — Z5181 Encounter for therapeutic drug level monitoring: Secondary | ICD-10-CM | POA: Diagnosis not present

## 2016-03-25 DIAGNOSIS — I4891 Unspecified atrial fibrillation: Secondary | ICD-10-CM | POA: Diagnosis not present

## 2016-03-25 DIAGNOSIS — I480 Paroxysmal atrial fibrillation: Secondary | ICD-10-CM

## 2016-03-25 LAB — POCT INR: INR: 2.3

## 2016-04-15 ENCOUNTER — Ambulatory Visit (INDEPENDENT_AMBULATORY_CARE_PROVIDER_SITE_OTHER): Payer: Medicare Other | Admitting: *Deleted

## 2016-04-15 DIAGNOSIS — Z5181 Encounter for therapeutic drug level monitoring: Secondary | ICD-10-CM | POA: Diagnosis not present

## 2016-04-15 DIAGNOSIS — I4891 Unspecified atrial fibrillation: Secondary | ICD-10-CM | POA: Diagnosis not present

## 2016-04-15 DIAGNOSIS — I480 Paroxysmal atrial fibrillation: Secondary | ICD-10-CM

## 2016-04-15 LAB — POCT INR: INR: 2.2

## 2016-04-19 DIAGNOSIS — N186 End stage renal disease: Secondary | ICD-10-CM | POA: Diagnosis not present

## 2016-04-19 DIAGNOSIS — I129 Hypertensive chronic kidney disease with stage 1 through stage 4 chronic kidney disease, or unspecified chronic kidney disease: Secondary | ICD-10-CM | POA: Diagnosis not present

## 2016-04-19 DIAGNOSIS — Z992 Dependence on renal dialysis: Secondary | ICD-10-CM | POA: Diagnosis not present

## 2016-04-21 DIAGNOSIS — D631 Anemia in chronic kidney disease: Secondary | ICD-10-CM | POA: Diagnosis not present

## 2016-04-21 DIAGNOSIS — I482 Chronic atrial fibrillation: Secondary | ICD-10-CM | POA: Diagnosis not present

## 2016-04-21 DIAGNOSIS — D509 Iron deficiency anemia, unspecified: Secondary | ICD-10-CM | POA: Diagnosis not present

## 2016-04-21 DIAGNOSIS — N186 End stage renal disease: Secondary | ICD-10-CM | POA: Diagnosis not present

## 2016-04-21 DIAGNOSIS — D688 Other specified coagulation defects: Secondary | ICD-10-CM | POA: Diagnosis not present

## 2016-04-21 DIAGNOSIS — R51 Headache: Secondary | ICD-10-CM | POA: Diagnosis not present

## 2016-04-21 DIAGNOSIS — E8779 Other fluid overload: Secondary | ICD-10-CM | POA: Diagnosis not present

## 2016-04-21 DIAGNOSIS — R52 Pain, unspecified: Secondary | ICD-10-CM | POA: Diagnosis not present

## 2016-05-10 ENCOUNTER — Inpatient Hospital Stay (HOSPITAL_COMMUNITY)
Admission: EM | Admit: 2016-05-10 | Discharge: 2016-05-12 | DRG: 871 | Disposition: A | Discharge status: Home / Self Care | Payer: Medicare Other | Attending: Internal Medicine | Admitting: Internal Medicine

## 2016-05-10 ENCOUNTER — Emergency Department (HOSPITAL_COMMUNITY): Payer: Medicare Other

## 2016-05-10 ENCOUNTER — Encounter (HOSPITAL_COMMUNITY): Payer: Self-pay | Admitting: Emergency Medicine

## 2016-05-10 DIAGNOSIS — W010XXA Fall on same level from slipping, tripping and stumbling without subsequent striking against object, initial encounter: Secondary | ICD-10-CM | POA: Diagnosis present

## 2016-05-10 DIAGNOSIS — Z7901 Long term (current) use of anticoagulants: Secondary | ICD-10-CM

## 2016-05-10 DIAGNOSIS — Z90710 Acquired absence of both cervix and uterus: Secondary | ICD-10-CM

## 2016-05-10 DIAGNOSIS — J189 Pneumonia, unspecified organism: Secondary | ICD-10-CM

## 2016-05-10 DIAGNOSIS — I420 Dilated cardiomyopathy: Secondary | ICD-10-CM | POA: Diagnosis present

## 2016-05-10 DIAGNOSIS — Y95 Nosocomial condition: Secondary | ICD-10-CM | POA: Diagnosis present

## 2016-05-10 DIAGNOSIS — D631 Anemia in chronic kidney disease: Secondary | ICD-10-CM | POA: Diagnosis not present

## 2016-05-10 DIAGNOSIS — D638 Anemia in other chronic diseases classified elsewhere: Secondary | ICD-10-CM | POA: Diagnosis present

## 2016-05-10 DIAGNOSIS — E860 Dehydration: Secondary | ICD-10-CM | POA: Diagnosis present

## 2016-05-10 DIAGNOSIS — Z833 Family history of diabetes mellitus: Secondary | ICD-10-CM

## 2016-05-10 DIAGNOSIS — E8889 Other specified metabolic disorders: Secondary | ICD-10-CM | POA: Diagnosis present

## 2016-05-10 DIAGNOSIS — N2581 Secondary hyperparathyroidism of renal origin: Secondary | ICD-10-CM | POA: Diagnosis not present

## 2016-05-10 DIAGNOSIS — Z992 Dependence on renal dialysis: Secondary | ICD-10-CM | POA: Diagnosis not present

## 2016-05-10 DIAGNOSIS — K219 Gastro-esophageal reflux disease without esophagitis: Secondary | ICD-10-CM | POA: Diagnosis present

## 2016-05-10 DIAGNOSIS — N1330 Unspecified hydronephrosis: Secondary | ICD-10-CM | POA: Diagnosis not present

## 2016-05-10 DIAGNOSIS — I10 Essential (primary) hypertension: Secondary | ICD-10-CM | POA: Diagnosis present

## 2016-05-10 DIAGNOSIS — R531 Weakness: Secondary | ICD-10-CM | POA: Diagnosis not present

## 2016-05-10 DIAGNOSIS — Z6832 Body mass index (BMI) 32.0-32.9, adult: Secondary | ICD-10-CM | POA: Diagnosis not present

## 2016-05-10 DIAGNOSIS — R509 Fever, unspecified: Secondary | ICD-10-CM | POA: Diagnosis present

## 2016-05-10 DIAGNOSIS — Z8249 Family history of ischemic heart disease and other diseases of the circulatory system: Secondary | ICD-10-CM | POA: Diagnosis not present

## 2016-05-10 DIAGNOSIS — R918 Other nonspecific abnormal finding of lung field: Secondary | ICD-10-CM | POA: Diagnosis not present

## 2016-05-10 DIAGNOSIS — E785 Hyperlipidemia, unspecified: Secondary | ICD-10-CM | POA: Diagnosis present

## 2016-05-10 DIAGNOSIS — I480 Paroxysmal atrial fibrillation: Secondary | ICD-10-CM | POA: Diagnosis present

## 2016-05-10 DIAGNOSIS — H409 Unspecified glaucoma: Secondary | ICD-10-CM | POA: Diagnosis present

## 2016-05-10 DIAGNOSIS — Y92009 Unspecified place in unspecified non-institutional (private) residence as the place of occurrence of the external cause: Secondary | ICD-10-CM

## 2016-05-10 DIAGNOSIS — N390 Urinary tract infection, site not specified: Secondary | ICD-10-CM | POA: Diagnosis not present

## 2016-05-10 DIAGNOSIS — N186 End stage renal disease: Secondary | ICD-10-CM | POA: Diagnosis not present

## 2016-05-10 DIAGNOSIS — N189 Chronic kidney disease, unspecified: Secondary | ICD-10-CM | POA: Diagnosis present

## 2016-05-10 DIAGNOSIS — N329 Bladder disorder, unspecified: Secondary | ICD-10-CM | POA: Diagnosis not present

## 2016-05-10 DIAGNOSIS — I132 Hypertensive heart and chronic kidney disease with heart failure and with stage 5 chronic kidney disease, or end stage renal disease: Secondary | ICD-10-CM | POA: Diagnosis present

## 2016-05-10 DIAGNOSIS — R404 Transient alteration of awareness: Secondary | ICD-10-CM | POA: Diagnosis not present

## 2016-05-10 DIAGNOSIS — E43 Unspecified severe protein-calorie malnutrition: Secondary | ICD-10-CM | POA: Diagnosis not present

## 2016-05-10 DIAGNOSIS — Z87440 Personal history of urinary (tract) infections: Secondary | ICD-10-CM | POA: Diagnosis not present

## 2016-05-10 DIAGNOSIS — I5032 Chronic diastolic (congestive) heart failure: Secondary | ICD-10-CM | POA: Diagnosis not present

## 2016-05-10 DIAGNOSIS — A419 Sepsis, unspecified organism: Principal | ICD-10-CM | POA: Diagnosis present

## 2016-05-10 DIAGNOSIS — R0989 Other specified symptoms and signs involving the circulatory and respiratory systems: Secondary | ICD-10-CM

## 2016-05-10 DIAGNOSIS — Z8052 Family history of malignant neoplasm of bladder: Secondary | ICD-10-CM | POA: Diagnosis not present

## 2016-05-10 DIAGNOSIS — I120 Hypertensive chronic kidney disease with stage 5 chronic kidney disease or end stage renal disease: Secondary | ICD-10-CM | POA: Diagnosis not present

## 2016-05-10 DIAGNOSIS — R0602 Shortness of breath: Secondary | ICD-10-CM | POA: Diagnosis not present

## 2016-05-10 HISTORY — DX: Pneumonia, unspecified organism: J18.9

## 2016-05-10 HISTORY — DX: Urinary tract infection, site not specified: N39.0

## 2016-05-10 LAB — COMPREHENSIVE METABOLIC PANEL
ALT: 21 U/L (ref 14–54)
AST: 22 U/L (ref 15–41)
Albumin: 2.5 g/dL — ABNORMAL LOW (ref 3.5–5.0)
Alkaline Phosphatase: 81 U/L (ref 38–126)
Anion gap: 11 (ref 5–15)
BUN: 28 mg/dL — ABNORMAL HIGH (ref 6–20)
CO2: 26 mmol/L (ref 22–32)
Calcium: 8.3 mg/dL — ABNORMAL LOW (ref 8.9–10.3)
Chloride: 94 mmol/L — ABNORMAL LOW (ref 101–111)
Creatinine, Ser: 5.93 mg/dL — ABNORMAL HIGH (ref 0.44–1.00)
GFR calc Af Amer: 7 mL/min — ABNORMAL LOW (ref >60)
GFR calc non Af Amer: 6 mL/min — ABNORMAL LOW (ref >60)
Glucose, Bld: 107 mg/dL — ABNORMAL HIGH (ref 65–99)
Potassium: 3.5 mmol/L (ref 3.5–5.1)
Sodium: 131 mmol/L — ABNORMAL LOW (ref 135–145)
Total Bilirubin: 0.6 mg/dL (ref 0.3–1.2)
Total Protein: 5.6 g/dL — ABNORMAL LOW (ref 6.5–8.1)

## 2016-05-10 LAB — URINALYSIS, ROUTINE W REFLEX MICROSCOPIC
Bilirubin Urine: NEGATIVE
Glucose, UA: NEGATIVE mg/dL
Ketones, ur: NEGATIVE mg/dL
Nitrite: NEGATIVE
Protein, ur: >300 mg/dL — AB
Specific Gravity, Urine: 1.01 (ref 1.005–1.030)
pH: 7 (ref 5.0–8.0)

## 2016-05-10 LAB — RENAL FUNCTION PANEL
Albumin: 2.7 g/dL — ABNORMAL LOW (ref 3.5–5.0)
Anion gap: 12 (ref 5–15)
BUN: 27 mg/dL — ABNORMAL HIGH (ref 6–20)
CO2: 25 mmol/L (ref 22–32)
Calcium: 8.6 mg/dL — ABNORMAL LOW (ref 8.9–10.3)
Chloride: 96 mmol/L — ABNORMAL LOW (ref 101–111)
Creatinine, Ser: 5.86 mg/dL — ABNORMAL HIGH (ref 0.44–1.00)
GFR calc Af Amer: 8 mL/min — ABNORMAL LOW (ref >60)
GFR calc non Af Amer: 7 mL/min — ABNORMAL LOW (ref >60)
Glucose, Bld: 127 mg/dL — ABNORMAL HIGH (ref 65–99)
Phosphorus: 2.6 mg/dL (ref 2.5–4.6)
Potassium: 3.7 mmol/L (ref 3.5–5.1)
Sodium: 133 mmol/L — ABNORMAL LOW (ref 135–145)

## 2016-05-10 LAB — CBC
HCT: 30.2 % — ABNORMAL LOW (ref 36.0–46.0)
Hemoglobin: 9.5 g/dL — ABNORMAL LOW (ref 12.0–15.0)
MCH: 29.2 pg (ref 26.0–34.0)
MCHC: 31.5 g/dL (ref 30.0–36.0)
MCV: 92.9 fL (ref 78.0–100.0)
Platelets: 166 10*3/uL (ref 150–400)
RBC: 3.25 MIL/uL — ABNORMAL LOW (ref 3.87–5.11)
RDW: 15.2 % (ref 11.5–15.5)
WBC: 21.5 10*3/uL — ABNORMAL HIGH (ref 4.0–10.5)

## 2016-05-10 LAB — URINE MICROSCOPIC-ADD ON: Squamous Epithelial / LPF: NONE SEEN

## 2016-05-10 LAB — I-STAT CHEM 8, ED
BUN: 29 mg/dL — ABNORMAL HIGH (ref 6–20)
Calcium, Ion: 1.01 mmol/L — ABNORMAL LOW (ref 1.15–1.40)
Chloride: 96 mmol/L — ABNORMAL LOW (ref 101–111)
Creatinine, Ser: 6.2 mg/dL — ABNORMAL HIGH (ref 0.44–1.00)
Glucose, Bld: 101 mg/dL — ABNORMAL HIGH (ref 65–99)
HCT: 26 % — ABNORMAL LOW (ref 36.0–46.0)
Hemoglobin: 8.8 g/dL — ABNORMAL LOW (ref 12.0–15.0)
Potassium: 3.6 mmol/L (ref 3.5–5.1)
Sodium: 131 mmol/L — ABNORMAL LOW (ref 135–145)
TCO2: 26 mmol/L (ref 0–100)

## 2016-05-10 LAB — PROTIME-INR
INR: 2.12
Prothrombin Time: 24.1 seconds — ABNORMAL HIGH (ref 11.4–15.2)

## 2016-05-10 LAB — CBG MONITORING, ED: Glucose-Capillary: 130 mg/dL — ABNORMAL HIGH (ref 65–99)

## 2016-05-10 LAB — LACTIC ACID, PLASMA
Lactic Acid, Venous: 1.8 mmol/L (ref 0.5–1.9)
Lactic Acid, Venous: 1.4 mmol/L (ref 0.5–1.9)

## 2016-05-10 LAB — I-STAT CG4 LACTIC ACID, ED: Lactic Acid, Venous: 1.01 mmol/L (ref 0.5–1.9)

## 2016-05-10 MED ORDER — FAMOTIDINE 20 MG PO TABS
10.0000 mg | ORAL_TABLET | Freq: Every day | ORAL | Status: DC
  Administered 2016-05-10 – 2016-05-12 (×3): 10 mg via ORAL
  Filled 2016-05-10 (×3): qty 1

## 2016-05-10 MED ORDER — SALINE SPRAY 0.65 % NA SOLN
1.0000 | Freq: Three times a day (TID) | NASAL | Status: DC | PRN
  Filled 2016-05-10: qty 44

## 2016-05-10 MED ORDER — SACCHAROMYCES BOULARDII 250 MG PO CAPS
250.0000 mg | ORAL_CAPSULE | Freq: Two times a day (BID) | ORAL | Status: DC
  Administered 2016-05-10 – 2016-05-12 (×4): 250 mg via ORAL
  Filled 2016-05-10 (×4): qty 1

## 2016-05-10 MED ORDER — TIMOLOL HEMIHYDRATE 0.5 % OP SOLN: 1.0000 [drp] | Freq: Every morning | OPHTHALMIC | Status: DC

## 2016-05-10 MED ORDER — ACETAMINOPHEN 325 MG PO TABS
650.0000 mg | ORAL_TABLET | Freq: Once | ORAL | Status: AC
  Administered 2016-05-10: 650 mg via ORAL
  Filled 2016-05-10: qty 2

## 2016-05-10 MED ORDER — ACETAMINOPHEN 325 MG PO TABS
650.0000 mg | ORAL_TABLET | Freq: Four times a day (QID) | ORAL | Status: DC | PRN
  Administered 2016-05-10 – 2016-05-12 (×4): 650 mg via ORAL
  Filled 2016-05-10 (×4): qty 2

## 2016-05-10 MED ORDER — DEXTROSE 5 % IV SOLN: 1.0000 g | Freq: Three times a day (TID) | INTRAVENOUS | Status: DC

## 2016-05-10 MED ORDER — NEPRO/CARBSTEADY PO LIQD
237.0000 mL | Freq: Every day | ORAL | Status: DC | PRN
  Filled 2016-05-10: qty 237

## 2016-05-10 MED ORDER — ACETAMINOPHEN 650 MG RE SUPP: 650.0000 mg | Freq: Four times a day (QID) | RECTAL | Status: DC | PRN

## 2016-05-10 MED ORDER — VANCOMYCIN HCL 10 G IV SOLR
1750.0000 mg | Freq: Once | INTRAVENOUS | Status: AC
  Administered 2016-05-10: 1750 mg via INTRAVENOUS
  Filled 2016-05-10: qty 1750

## 2016-05-10 MED ORDER — ONDANSETRON HCL 4 MG/2ML IJ SOLN: 4.0000 mg | Freq: Four times a day (QID) | INTRAMUSCULAR | Status: DC | PRN

## 2016-05-10 MED ORDER — CALCIUM ACETATE (PHOS BINDER) 667 MG PO CAPS
667.0000 mg | ORAL_CAPSULE | Freq: Three times a day (TID) | ORAL | Status: DC
  Administered 2016-05-10 – 2016-05-12 (×5): 667 mg via ORAL
  Filled 2016-05-10 (×5): qty 1

## 2016-05-10 MED ORDER — WARFARIN - PHARMACIST DOSING INPATIENT
Freq: Every day | Status: DC
  Administered 2016-05-10: 18:00:00

## 2016-05-10 MED ORDER — SODIUM CHLORIDE 0.9 % IV BOLUS (SEPSIS)
1000.0000 mL | Freq: Once | INTRAVENOUS | Status: AC
  Administered 2016-05-10: 1000 mL via INTRAVENOUS

## 2016-05-10 MED ORDER — DEXTROSE 5 % IV SOLN
1.0000 g | Freq: Once | INTRAVENOUS | Status: DC
  Filled 2016-05-10: qty 1

## 2016-05-10 MED ORDER — AMIODARONE HCL 200 MG PO TABS
200.0000 mg | ORAL_TABLET | Freq: Every day | ORAL | Status: DC
  Administered 2016-05-10 – 2016-05-12 (×3): 200 mg via ORAL
  Filled 2016-05-10 (×3): qty 1

## 2016-05-10 MED ORDER — CEFTRIAXONE SODIUM 1 G IJ SOLR
1.0000 g | Freq: Once | INTRAMUSCULAR | Status: AC
Start: 2016-05-10 — End: 2016-05-10
  Administered 2016-05-10: 1 g via INTRAVENOUS
  Filled 2016-05-10: qty 10

## 2016-05-10 MED ORDER — LATANOPROST 0.005 % OP SOLN
1.0000 [drp] | Freq: Every day | OPHTHALMIC | Status: DC
  Administered 2016-05-10 – 2016-05-11 (×2): 1 [drp] via OPHTHALMIC
  Filled 2016-05-10: qty 2.5

## 2016-05-10 MED ORDER — ONDANSETRON HCL 4 MG PO TABS: 4.0000 mg | ORAL_TABLET | Freq: Four times a day (QID) | ORAL | Status: DC | PRN

## 2016-05-10 MED ORDER — DEXTROSE 5 % IV SOLN
1.0000 g | INTRAVENOUS | Status: DC
  Administered 2016-05-10 – 2016-05-11 (×2): 1 g via INTRAVENOUS
  Filled 2016-05-10 (×4): qty 1

## 2016-05-10 MED ORDER — SODIUM CHLORIDE 0.9% FLUSH
3.0000 mL | Freq: Two times a day (BID) | INTRAVENOUS | Status: DC
  Administered 2016-05-10 – 2016-05-12 (×4): 3 mL via INTRAVENOUS

## 2016-05-10 MED ORDER — WARFARIN SODIUM 2.5 MG PO TABS
2.5000 mg | ORAL_TABLET | Freq: Once | ORAL | Status: AC
  Administered 2016-05-10: 2.5 mg via ORAL
  Filled 2016-05-10: qty 1

## 2016-05-10 MED ORDER — TIMOLOL MALEATE 0.5 % OP SOLN
1.0000 [drp] | Freq: Every morning | OPHTHALMIC | Status: DC
  Administered 2016-05-11 – 2016-05-12 (×2): 1 [drp] via OPHTHALMIC
  Filled 2016-05-10: qty 5

## 2016-05-10 MED ORDER — SODIUM CHLORIDE 0.9 % IV SOLN
INTRAVENOUS | Status: AC
  Administered 2016-05-10: 50 mL/h via INTRAVENOUS

## 2016-05-10 MED ORDER — HEPARIN SODIUM (PORCINE) 5000 UNIT/ML IJ SOLN
5000.0000 [IU] | Freq: Three times a day (TID) | INTRAMUSCULAR | Status: DC
  Administered 2016-05-10: 5000 [IU] via SUBCUTANEOUS
  Filled 2016-05-10: qty 1

## 2016-05-10 NOTE — ED Notes (Signed)
EDP stated pt could have oral fluids.

## 2016-05-10 NOTE — ED Notes (Signed)
Called pharmacy to have Maxipime verified unable to obtain in pyxis.

## 2016-05-10 NOTE — ED Notes (Signed)
Pt CBG was 130 notified Facilities manager)

## 2016-05-10 NOTE — ED Provider Notes (Signed)
[facility] DEPT Provider Note   CSN: [id number] Arrival date & time: 05/10/16  1050     History   Chief Complaint Chief Complaint  Patient presents with  . Fall  . Weakness    HPI [name] is a 70 y.o. female.  HPI  70 year old female with significant history listed below including ESRD on dialysis Tuesday Thursday since Saturdays presents to the ED after mechanical fall while getting out of bed and increasing fatigue over the past several days. Patient reports that she had dialysis on Thursday however was cut short due to infiltration and return on Friday for continued dialysis. Patient again showed up to her Saturday session and had a full session on that day as well too. Patient was too weak to get off the floor this morning and thus EMS was contacted. She denies any significant trauma during the fall. No LOC or head trauma. No notable alleviating or aggravating factors. Upon EMS arrival patient was noted to be febrile to 104 with low blood pressures. Patient was given small IV fluid bolus which improved her blood pressure. Currently patient denies any chest pain, shortness of breath, nausea, vomiting, diarrhea, abdominal pain, urinary symptoms. Of note patient does report frequent urinary tract infections due to required instrumentation which was removed several months ago. Since then patient has not had a urinary tract infection.   Past Medical History:  Diagnosis Date  . Cardiomyopathy ([place])    a. h/o LV dysfunction EF 20-25% in 2013 due to sepsis.>> improved to normal   . Chronic diastolic CHF (congestive heart failure) ([place])    10/ 2013 in setting of septic shock  . Complication of anesthesia    use a little anesthesia , per patient MD states she quit breathing (2016); hard to wake up  . ESRD (end stage renal disease) ([place])    dialysis Tues, Thurs, Sat  . GERD (gastroesophageal reflux disease)   . Glaucoma   . Glaucoma   . H/O hiatal hernia    a. CT 2017:  large gastric hiatal hernia.  Marland Kitchen Headache(784.0)    migraine  . History of blood transfusion 04/13/2015   . History of echocardiogram    a. Echo 6/17: EF 60-65%, normal wall motion, mild MR, atrial septal lipomatous hypertrophy, PASP 34 mmHg, possible trivial free-flowing pericardial effusion along RV free wall // b. Echo 5/17: Mild LVH, EF 55-60%, normal wall motion, grade 1 diastolic dysfunction, trivial MR, severe LAE, mild RAE, PASP 42 mmHg  . History of nephrostomy 04/11/2015    currently inplace 04/28/2015   . History of nuclear stress test    a. Myoview 1/14 - Marked ischemia in the basal anterior, mid anterior, apical septal and apical inferior regions, EF 63% >> LHC normal   . Hyperlipidemia   . Hypertension    medication removed from regimen due to low blood pressure   . Iron deficiency    hx  . Nephrolithiasis 2002, 2006   bilateral  . Normal coronary arteries 2014   a. LHC in 1/14: normal coronary arteries  . PAF (paroxysmal atrial fibrillation) ([place])    a. 10/ 2013  in setting of Septic Shock //  b. recurrent during admit for pneumonia, L effusion >> placed on Amiodarone // Coumadin for anticoagulation  . Pneumonia    dx 10-06-2014 per CXR--  on 10-27-2014 pt states finished antibiotic and denies cough or fever  . S/P hemodialysis catheter insertion ([place]) 04/11/2015    right anterior chest , only  used once   . Sigmoid diverticulosis     Patient Active Problem List   Diagnosis Date Noted  . PNA (pneumonia) 05/10/2016  . On amiodarone therapy 11/30/2015  . Pleural effusion on left   . Pleurisy   . Hypoxemia   . HCAP (healthcare-associated pneumonia) 11/21/2015  . S/P thoracentesis   . Chest pain 11/20/2015  . Pleural effusion, left 11/20/2015  . Pain in the chest   . Pericardial effusion   . Pleural effusion   . Encounter for therapeutic drug monitoring 11/13/2015  . Atrial fibrillation with RVR ([place])   . PAF (paroxysmal atrial fibrillation) ([place]) 11/05/2015  .  Urinary tract infectious disease 10/31/2015  . Pyelonephritis 09/25/2015  . Sepsis ([place]) 09/25/2015  . Anemia of renal disease 08/14/2015  . Severe protein-calorie malnutrition ([place]) 05/27/2015  . End stage renal disease ([place])   . Normocytic anemia 11/23/2014  . Essential hypertension, benign 08/14/2013  . Glaucoma     Past Surgical History:  Procedure Laterality Date  . ABDOMINAL HYSTERECTOMY    . AV FISTULA PLACEMENT Left 06/02/2015   Procedure: BRACHIOCEPHALIC ARTERIOVENOUS (AV) FISTULA CREATION ;  Surgeon: Conrad Rodriguez Camp, MD;  Location: [facility];  Service: Vascular;  Laterality: Left;  . BASCILIC VEIN TRANSPOSITION Left 07/27/2015   Procedure: FIRST STAGE BASILIC VEIN TRANSPOSITION LEFT UPPER ARM;  Surgeon: Conrad Isabela, MD;  Location: [facility];  Service: Vascular;  Laterality: Left;  . BASCILIC VEIN TRANSPOSITION Left 09/2015   second phase  . BASCILIC VEIN TRANSPOSITION Left 10/12/2015   Procedure: SECOND STAGE BASILIC VEIN TRANSPOSITION LEFT ARM;  Surgeon: Conrad Niles, MD;  Location: [facility];  Service: Vascular;  Laterality: Left;  . BREAST BIOPSY Left 08/23/07   benign fibrocystic with duct ectasia  . CARDIAC CATHETERIZATION  07-11-2012  dr Irish Lack   Abnormal stress test/   normal coronary arteries/  LVEDP  12mmHg  . CARDIOVASCULAR STRESS TEST  06-26-2012  dr Irish Lack   marked ischemia in the basal anterior, mid anterior, apical inferior regions/  normal LVF, ef 63%  . CATARACT EXTRACTION W/ INTRAOCULAR LENS  IMPLANT, BILATERAL    . CYSTOSCOPY W/ URETERAL STENT PLACEMENT  04/04/2012   Procedure: CYSTOSCOPY WITH RETROGRADE PYELOGRAM/URETERAL STENT PLACEMENT;  Surgeon: Ailene Rud, MD;  Location: [facility];  Service: Urology;  Laterality: Left;  . CYSTOSCOPY W/ URETERAL STENT PLACEMENT Bilateral 05/04/2015   Procedure: CYSTOSCOPY WITH BILATERAL RETROGRADE PYELOGRAM/ WITH INTERPRETATION, EXCHANGE OF RIGHT URETERAL STENT REPLACEMENT AND PLACEMENT LEFT URETERAL STENT PLACEMENT EXAMINATION  OF VAGINA;  Surgeon: Carolan Clines, MD;  Location: [facility];  Service: Urology;  Laterality: Bilateral;  . CYSTOSCOPY WITH STENT PLACEMENT Right 10/28/2014   Procedure: RIGHT URETERAL STENT PLACEMENT;  Surgeon: Irine Seal, MD;  Location[facility];  Service: Urology;  Laterality: Right;  . CYSTOSCOPY WITH STENT PLACEMENT Right 02/26/2015   Procedure: CYSTOSCOPY RETROGRADE PYELOGRAM WITH STENT PLACEMENT;  Surgeon: Cleon Gustin, MD;  Location: [facility];  Service: Urology;  Laterality: Right;  . CYSTOSCOPY/RETROGRADE/URETEROSCOPY/STONE EXTRACTION WITH BASKET Right 11/21/2014   Procedure: CYSTOSCOPY/RIGHT RETROGRADE PYELOGRAM/RIGHT URETEROSCOPY/BASKET EXTRACTION/RIGHT PYELOSCOPY/LASER OF STONE/RIGHT DOUBLE J STENT;  Surgeon: Carolan Clines, MD;  Location: [facility];  Service: Urology;  Laterality: Right;  . EXTRACORPOREAL SHOCK WAVE LITHOTRIPSY  05-28-2012  &  10-08-2012  . HOLMIUM LASER APPLICATION Right 08/20/2949   Procedure: HOLMIUM LASER APPLICATION;  Surgeon: Carolan Clines, MD;  Location: [facility];  Service: Urology;  Laterality: Right;  . IR GENERIC HISTORICAL  02/01/2016   IR NEPHROSTOMY EXCHANGE RIGHT 02/01/2016 Greggory Keen, MD [facility]  . IR GENERIC HISTORICAL  02/24/2016   IR PATIENT EVAL TECH 0-60 MINS 02/24/2016 Aletta Edouard, MD [facility]  . KNEE ARTHROSCOPY Left 02-14-2003  . LAPAROSCOPIC CHOLECYSTECTOMY  03-23-2005  . TOTAL ABDOMINAL HYSTERECTOMY W/ BILATERAL SALPINGOOPHORECTOMY  1993   secondary to fibroids  . TRANSTHORACIC ECHOCARDIOGRAM  04-09-2012   normal LVF,  ef 60-65%,  mild LAE,  mild TR, trivial MR and PR    OB History    Gravida Para Term Preterm AB Living   1 1 1  0 0 1   SAB TAB Ectopic Multiple Live Births   0 0 0 0 1       Home Medications    Prior to Admission medications   Medication Sig Start Date End Date Taking? Authorizing Provider  acetaminophen (TYLENOL) 500 MG tablet Take 1,000 mg  by mouth every 6 (six) hours as needed for moderate pain or headache.    Yes Historical Provider, MD  amiodarone (PACERONE) 200 MG tablet Take 1 tablet (200 mg total) by mouth daily. 12/07/15  Yes [place] T Weaver, PA-C  bimatoprost (LUMIGAN) 0.01 % SOLN Place 1 drop into both eyes at bedtime.   Yes Historical Provider, MD  calcium acetate (PHOSLO) 667 MG capsule Take 667 mg by mouth 3 (three) times daily with meals.   Yes Historical Provider, MD  Multiple Vitamins-Minerals (MULTIVITAMIN WITH MINERALS) tablet Take 1 tablet by mouth daily.   Yes Historical Provider, MD  Nutritional Supplements (FEEDING SUPPLEMENT, NEPRO CARB STEADY,) LIQD Take 237 mLs by mouth daily. Patient taking differently: Take 237 mLs by mouth daily as needed (nutritional supplement).  05/07/15  Yes Maryann Mikhail, DO  ranitidine (ZANTAC) 150 MG tablet Take 150 mg by mouth 2 (two) times daily as needed for heartburn.   Yes Historical Provider, MD  saccharomyces boulardii (FLORASTOR) 250 MG capsule Take 1 capsule (250 mg total) by mouth 2 (two) times daily. 07/08/15  Yes Donne Hazel, MD  sodium chloride (OCEAN) 0.65 % SOLN nasal spray Place 1 spray into both nostrils 3 (three) times daily as needed for congestion.   Yes Historical Provider, MD  timolol (BETIMOL) 0.5 % ophthalmic solution Place 1 drop into both eyes every morning.    Yes Historical Provider, MD  warfarin (COUMADIN) 5 MG tablet Take as directed by [facility] Patient taking differently: Take 2.5-5 mg by mouth daily at 6 PM. TAKES 5MG  ON MON, WED AND FRI  TAKES 2.5MG  ALL OTHER DAYS 02/16/16  Yes Jettie Booze, MD    Family History Family History  Problem Relation Age of Onset  . Hypertension Mother   . Cancer Mother 71    breast  . Dementia Mother   . Hypertension Brother   . Diabetes Brother   . Heart disease Brother     before age 51  . Cancer Father 88    pancreatic  . Heart failure Paternal Grandmother   . Bladder Cancer Maternal  Grandfather     Social History Social History  Substance Use Topics  . Smoking status: Never Smoker  . Smokeless tobacco: Never Used  . Alcohol use No     Allergies   Vicodin [hydrocodone-acetaminophen]; Chlorhexidine; and Percocet [oxycodone-acetaminophen]   Review of Systems Review of Systems Ten systems are reviewed and are negative for acute change except as noted in the HPI   Physical Exam Updated Vital Signs BP (!) 111/48 (BP Location: Right Arm)  Pulse 98   Temp 100.2 F (37.9 C) (Oral)   Resp 23   Ht 5\' 3"  (1.6 m)   Wt 193 lb (87.5 kg)   LMP 01/19/1992 (Approximate)   SpO2 99%   BMI 34.19 kg/m   Physical Exam  Constitutional: She is oriented to person, place, and time. She appears well-developed and well-nourished. She appears ill. No distress.  HENT:  Head: Normocephalic and atraumatic.  Nose: Nose normal.  Eyes: Conjunctivae and EOM are normal. Pupils are equal, round, and reactive to light. Right eye exhibits no discharge. Left eye exhibits no discharge. No scleral icterus.  Neck: Normal range of motion. Neck supple.  Cardiovascular: Normal rate and regular rhythm.  Exam reveals no gallop and no friction rub.   No murmur heard. Left bicep AV fistula with good thrill  Pulmonary/Chest: Effort normal and breath sounds normal. No stridor. No respiratory distress. She has no rales.  Abdominal: Soft. She exhibits no distension. There is no tenderness.  Musculoskeletal: She exhibits no edema or tenderness.  Neurological: She is alert and oriented to person, place, and time.  Skin: Skin is warm and dry. No rash noted. She is not diaphoretic. No erythema.  Psychiatric: She has a normal mood and affect.  Vitals reviewed.    ED Treatments / Results  Labs (all labs ordered are listed, but only abnormal results are displayed) Labs Reviewed  CBC - Abnormal; Notable for the following:       Result Value   WBC 21.5 (*)    RBC 3.25 (*)    Hemoglobin 9.5 (*)      HCT 30.2 (*)    All other components within normal limits  URINALYSIS, ROUTINE W REFLEX MICROSCOPIC (NOT AT [facility]) - Abnormal; Notable for the following:    Color, Urine RED (*)    APPearance TURBID (*)    Hgb urine dipstick LARGE (*)    Protein, ur >300 (*)    Leukocytes, UA LARGE (*)    All other components within normal limits  PROTIME-INR - Abnormal; Notable for the following:    Prothrombin Time 24.1 (*)    All other components within normal limits  URINE MICROSCOPIC-ADD ON - Abnormal; Notable for the following:    Bacteria, UA MANY (*)    All other components within normal limits  RENAL FUNCTION PANEL - Abnormal; Notable for the following:    Sodium 133 (*)    Chloride 96 (*)    Glucose, Bld 127 (*)    BUN 27 (*)    Creatinine, Ser 5.86 (*)    Calcium 8.6 (*)    Albumin 2.7 (*)    GFR calc non Af Amer 7 (*)    GFR calc Af Amer 8 (*)    All other components within normal limits  CBG MONITORING, ED - Abnormal; Notable for the following:    Glucose-Capillary 130 (*)    All other components within normal limits  I-STAT CHEM 8, ED - Abnormal; Notable for the following:    Sodium 131 (*)    Chloride 96 (*)    BUN 29 (*)    Creatinine, Ser 6.20 (*)    Glucose, Bld 101 (*)    Calcium, Ion 1.01 (*)    Hemoglobin 8.8 (*)    HCT 26.0 (*)    All other components within normal limits  CULTURE, BLOOD (ROUTINE X 2)  CULTURE, BLOOD (ROUTINE X 2)  URINE CULTURE  CULTURE, BLOOD (ROUTINE X 2)  CULTURE, BLOOD (  ROUTINE X 2)  CULTURE, EXPECTORATED SPUTUM-ASSESSMENT  GRAM STAIN  COMPREHENSIVE METABOLIC PANEL  HIV ANTIBODY (ROUTINE TESTING)  STREP PNEUMONIAE URINARY ANTIGEN  COMPREHENSIVE METABOLIC PANEL  CBC  I-STAT CG4 LACTIC ACID, ED  I-STAT CG4 LACTIC ACID, ED    EKG  EKG Interpretation  Date/Time:  Tuesday May 10 2016 10:52:00 EST Ventricular Rate:  96 PR Interval:    QRS Duration: 94 QT Interval:  410 QTC Calculation: 519 R Axis:   98 Text Interpretation:   Sinus rhythm Right axis deviation Prolonged QT interval No significant change since last tracing Confirmed by [place] ([id number]) on 05/10/2016 10:57:18 AM       Radiology Dg Chest 2 View  Result Date: 05/10/2016 CLINICAL DATA:  Shortness of breath. EXAM: CHEST  2 VIEW COMPARISON:  Radiographs of January 30, 2016. FINDINGS: Stable cardiomegaly. No pneumothorax is noted. Hypoinflation of the lungs is noted. Right lung is clear. Increased left basilar opacity is noted concerning for atelectasis or infiltrate. No significant pleural effusion is noted. Bony thorax is unremarkable. Right internal jugular dialysis catheter noted on prior exam has been removed. IMPRESSION: Mildly increased left basilar opacity is noted concerning for worsening atelectasis or infiltrate. Electronically Signed   By: Marijo Conception, M.D.   On: 05/10/2016 12:14    Procedures Procedures (including critical care time)  Medications Ordered in ED Medications  calcium acetate (PHOSLO) capsule 667 mg (not administered)  amiodarone (PACERONE) tablet 200 mg (200 mg Oral Given 05/10/16 1624)  saccharomyces boulardii (FLORASTOR) capsule 250 mg (not administered)  feeding supplement (NEPRO CARB STEADY) liquid 237 mL (not administered)  sodium chloride (OCEAN) 0.65 % nasal spray 1 spray (not administered)  famotidine (PEPCID) tablet 10 mg (10 mg Oral Given 05/10/16 1624)  timolol (BETIMOL) 0.5 % ophthalmic solution 1 drop (not administered)  latanoprost (XALATAN) 0.005 % ophthalmic solution 1 drop (not administered)  heparin injection 5,000 Units (5,000 Units Subcutaneous Given 05/10/16 1626)  sodium chloride flush (NS) 0.9 % injection 3 mL (not administered)  0.9 %  sodium chloride infusion (50 mL/hr Intravenous New Bag/Given 05/10/16 1622)  acetaminophen (TYLENOL) tablet 650 mg (not administered)    Or  acetaminophen (TYLENOL) suppository 650 mg (not administered)  ondansetron (ZOFRAN) tablet 4 mg (not administered)      Or  ondansetron (ZOFRAN) injection 4 mg (not administered)  vancomycin (VANCOCIN) 1,750 mg in sodium chloride 0.9 % 500 mL IVPB (1,750 mg Intravenous New Bag/Given 05/10/16 1655)  ceFEPIme (MAXIPIME) 1 g in dextrose 5 % 50 mL IVPB (not administered)  sodium chloride 0.9 % bolus 1,000 mL (0 mLs Intravenous Stopped 05/10/16 1300)  cefTRIAXone (ROCEPHIN) 1 g in dextrose 5 % 50 mL IVPB (0 g Intravenous Stopped 05/10/16 1336)  acetaminophen (TYLENOL) tablet 650 mg (650 mg Oral Given 05/10/16 1502)     Initial Impression / Assessment and Plan / ED Course  I have reviewed the triage vital signs and the nursing notes.  Pertinent labs & imaging results that were available during my care of the patient were reviewed by me and considered in my medical decision making (see chart for details).  Clinical Course     Workup revealed urinary tract infection with significant leukocytosis. Patient requiring continued IV fluid administration for blood pressure control. She does not meet sepsis criteria with normal lactic acid and maintaining blood pressures above systolics of 31S however she does have soft blood pressures that require continued IV fluids. Given her history of ESRD and recent extensive dialysis,  I feel that there is complaining of dehydration continued into her soft blood pressures. Patient was given IV Rocephin and admitted to hospitalist team for continued management.  Final Clinical Impressions(s) / ED Diagnoses   Final diagnoses:  Lower urinary tract infectious disease  Dehydration    Disposition: Admit  Condition: fair    Fatima Blank, MD 05/10/16 1724

## 2016-05-10 NOTE — ED Notes (Signed)
Blood cultures x 2 drawn earlier today.

## 2016-05-10 NOTE — ED Notes (Signed)
Pt assisted off of the bedpan with the assistance of [place], [place].  Performed pericare on the pt, and applied moisturizing cream to the pt's anus.  Repositioned pt on the bed.

## 2016-05-10 NOTE — H&P (Signed)
History and Physical    [name] LGX:[id number] DOB: 02/14/1946 DOA: 05/10/2016  PCP: [name], MD Patient coming from: home  Chief Complaint: fall/generalized weakness/fever  HPI: [name] is a very pleasant 70 y.o. female with medical history significant for end-stage renal disease Tuesday Thursday Saturday client, hypertension, PAF, cardiomyopathy, a tract infection disease status post stents presents to the emergency Department chief complaint of generalized weakness and fall and fever. Initial evaluation in the emergency department reveals a urinary tract infection a chest x-ray concerning for pneumonia.  Information is obtained from the patient. She states that she had an extra dialysis session lately as on last Thursday she was unable to complete her session and with the upcoming holiday her schedule was changed so she's had dialysis 3 out of the last 4 days and was feeling a little weak. This morning she was sitting on side of the bed reached for the telephone" fell my feet go out from underneath me". He reports she had difficulty getting up and needed to call EMS for systems. She denies hitting her head. She denies any recent illness fever nausea vomiting diarrhea. She denies dysuria hematuria frequency or urgency. She denies chest pain palpitation shortness of breath cough. She states that during dialysis yesterday but they take her temperature before and after the session and she was afebrile at this times. EMS was called and reported a temperature of 104 heart rate of 104 oxygen saturations 97% on 3 L a pressure of 99. He was provided with Tylenol and route.  ED Course: The emergency department she has a temperature of 102.2 she is hypotensive tachycardic with tachypnea. She is provided with 1 L normal saline and Rocephin is initiated. Her pressure improved and she got up to go to the bathroom and again became hypotensive.  Review of Systems: As per HPI otherwise  10 point review of systems negative.   Ambulatory Status: Malaise independently with steady gait independent with ADLs  Past Medical History:  Diagnosis Date  . Cardiomyopathy ([place])    a. h/o LV dysfunction EF 20-25% in 2013 due to sepsis.>> improved to normal   . Chronic diastolic CHF (congestive heart failure) ([place])    10/ 2013 in setting of septic shock  . Complication of anesthesia    use a little anesthesia , per patient MD states she quit breathing (2016); hard to wake up  . ESRD (end stage renal disease) ([place])    dialysis Tues, Thurs, Sat  . GERD (gastroesophageal reflux disease)   . Glaucoma   . Glaucoma   . H/O hiatal hernia    a. CT 2017: large gastric hiatal hernia.  Marland Kitchen Headache(784.0)    migraine  . History of blood transfusion 04/13/2015   . History of echocardiogram    a. Echo 6/17: EF 60-65%, normal wall motion, mild MR, atrial septal lipomatous hypertrophy, PASP 34 mmHg, possible trivial free-flowing pericardial effusion along RV free wall // b. Echo 5/17: Mild LVH, EF 55-60%, normal wall motion, grade 1 diastolic dysfunction, trivial MR, severe LAE, mild RAE, PASP 42 mmHg  . History of nephrostomy 04/11/2015    currently inplace 04/28/2015   . History of nuclear stress test    a. Myoview 1/14 - Marked ischemia in the basal anterior, mid anterior, apical septal and apical inferior regions, EF 63% >> LHC normal   . Hyperlipidemia   . Hypertension    medication removed from regimen due to low blood pressure   . Iron  deficiency    hx  . Nephrolithiasis 2002, 2006   bilateral  . Normal coronary arteries 2014   a. LHC in 1/14: normal coronary arteries  . PAF (paroxysmal atrial fibrillation) ([place])    a. 10/ 2013  in setting of Septic Shock //  b. recurrent during admit for pneumonia, L effusion >> placed on Amiodarone // Coumadin for anticoagulation  . Pneumonia    dx 10-06-2014 per CXR--  on 10-27-2014 pt states finished antibiotic and denies cough or fever  . S/P  hemodialysis catheter insertion ([facility]) 04/11/2015    right anterior chest , only used once   . Sigmoid diverticulosis     Past Surgical History:  Procedure Laterality Date  . ABDOMINAL HYSTERECTOMY    . AV FISTULA PLACEMENT Left 06/02/2015   Procedure: BRACHIOCEPHALIC ARTERIOVENOUS (AV) FISTULA CREATION ;  Surgeon: Conrad Strasburg, MD;  Location: [place];  Service: Vascular;  Laterality: Left;  . BASCILIC VEIN TRANSPOSITION Left 07/27/2015   Procedure: FIRST STAGE BASILIC VEIN TRANSPOSITION LEFT UPPER ARM;  Surgeon: Conrad [place], MD;  Location: [place];  Service: Vascular;  Laterality: Left;  . BASCILIC VEIN TRANSPOSITION Left 09/2015   second phase  . BASCILIC VEIN TRANSPOSITION Left 10/12/2015   Procedure: SECOND STAGE BASILIC VEIN TRANSPOSITION LEFT ARM;  Surgeon: Conrad Buford, MD;  Location: [place];  Service: Vascular;  Laterality: Left;  . BREAST BIOPSY Left 08/23/07   benign fibrocystic with duct ectasia  . CARDIAC CATHETERIZATION  07-11-2012  dr Irish Lack   Abnormal stress test/   normal coronary arteries/  LVEDP  19mmHg  . CARDIOVASCULAR STRESS TEST  06-26-2012  dr Irish Lack   marked ischemia in the basal anterior, mid anterior, apical inferior regions/  normal LVF, ef 63%  . CATARACT EXTRACTION W/ INTRAOCULAR LENS  IMPLANT, BILATERAL    . CYSTOSCOPY W/ URETERAL STENT PLACEMENT  04/04/2012   Procedure: CYSTOSCOPY WITH RETROGRADE PYELOGRAM/URETERAL STENT PLACEMENT;  Surgeon: Ailene Rud, MD;  Location: [place];  Service: Urology;  Laterality: Left;  . CYSTOSCOPY W/ URETERAL STENT PLACEMENT Bilateral 05/04/2015   Procedure: CYSTOSCOPY WITH BILATERAL RETROGRADE PYELOGRAM/ WITH INTERPRETATION, EXCHANGE OF RIGHT URETERAL STENT REPLACEMENT AND PLACEMENT LEFT URETERAL STENT PLACEMENT EXAMINATION OF VAGINA;  Surgeon: Carolan Clines, MD;  Location: [facility];  Service: Urology;  Laterality: Bilateral;  . CYSTOSCOPY WITH STENT PLACEMENT Right 10/28/2014   Procedure: RIGHT URETERAL STENT PLACEMENT;   Surgeon: Irine Seal, MD;  [facility];  Service: Urology;  Laterality: Right;  . CYSTOSCOPY WITH STENT PLACEMENT Right 02/26/2015   Procedure: CYSTOSCOPY RETROGRADE PYELOGRAM WITH STENT PLACEMENT;  Surgeon: Cleon Gustin, MD;  Location: [facility];  Service: Urology;  Laterality: Right;  . CYSTOSCOPY/RETROGRADE/URETEROSCOPY/STONE EXTRACTION WITH BASKET Right 11/21/2014   Procedure: CYSTOSCOPY/RIGHT RETROGRADE PYELOGRAM/RIGHT URETEROSCOPY/BASKET EXTRACTION/RIGHT PYELOSCOPY/LASER OF STONE/RIGHT DOUBLE J STENT;  Surgeon: Carolan Clines, MD;  Location: [place];  Service: Urology;  Laterality: Right;  . EXTRACORPOREAL SHOCK WAVE LITHOTRIPSY  05-28-2012  &  10-08-2012  . HOLMIUM LASER APPLICATION Right 06/25/1094   Procedure: HOLMIUM LASER APPLICATION;  Surgeon: Carolan Clines, MD;  Location: [facility];  Service: Urology;  Laterality: Right;  . IR GENERIC HISTORICAL  02/01/2016   IR NEPHROSTOMY EXCHANGE RIGHT 02/01/2016 Greggory Keen, MD [facility]  . IR GENERIC HISTORICAL  02/24/2016   IR PATIENT EVAL TECH 0-60 MINS 02/24/2016 Aletta Edouard, MD [facility]  . KNEE ARTHROSCOPY Left 02-14-2003  . LAPAROSCOPIC CHOLECYSTECTOMY  03-23-2005  . TOTAL ABDOMINAL  HYSTERECTOMY W/ BILATERAL SALPINGOOPHORECTOMY  1993   secondary to fibroids  . TRANSTHORACIC ECHOCARDIOGRAM  04-09-2012   normal LVF,  ef 60-65%,  mild LAE,  mild TR, trivial MR and PR    Social History   Social History  . Marital status: Widowed    Spouse name: N/A  . Number of children: 1  . Years of education: N/A   Occupational History  . retired   . legal assistant    Social History Main Topics  . Smoking status: Never Smoker  . Smokeless tobacco: Never Used  . Alcohol use No  . Drug use: No  . Sexual activity: No     Comment: widow husband passed 5/05 with lung cancer   Other Topics Concern  . Not on file   Social History Narrative  . No narrative on file     Allergies  Allergen Reactions  . Vicodin [Hydrocodone-Acetaminophen] Nausea And Vomiting  . Chlorhexidine Rash    Sunburn    rash  . Percocet [Oxycodone-Acetaminophen] Nausea And Vomiting    Family History  Problem Relation Age of Onset  . Hypertension Mother   . Cancer Mother 61    breast  . Dementia Mother   . Hypertension Brother   . Diabetes Brother   . Heart disease Brother     before age 1  . Cancer Father 57    pancreatic  . Heart failure Paternal Grandmother   . Bladder Cancer Maternal Grandfather     Prior to Admission medications   Medication Sig Start Date End Date Taking? Authorizing Provider  acetaminophen (TYLENOL) 500 MG tablet Take 1,000 mg by mouth every 6 (six) hours as needed for moderate pain or headache.     Historical Provider, MD  amiodarone (PACERONE) 200 MG tablet Take 1 tablet (200 mg total) by mouth daily. 12/07/15   Liliane Shi, PA-C  bimatoprost (LUMIGAN) 0.01 % SOLN Place 1 drop into both eyes at bedtime.    Historical Provider, MD  calcium acetate (PHOSLO) 667 MG capsule Take 667 mg by mouth 3 (three) times daily with meals.    Historical Provider, MD  metoprolol tartrate (LOPRESSOR) 25 MG tablet Take 12.5 mg by mouth 2 (two) times daily. 01/24/16   Historical Provider, MD  Multiple Vitamins-Minerals (MULTIVITAMIN WITH MINERALS) tablet Take 1 tablet by mouth daily.    Historical Provider, MD  Nutritional Supplements (FEEDING SUPPLEMENT, NEPRO CARB STEADY,) LIQD Take 237 mLs by mouth daily. Patient taking differently: Take 237 mLs by mouth daily as needed (nutritional supplement).  05/07/15   Maryann Mikhail, DO  ranitidine (ZANTAC) 150 MG tablet Take 150 mg by mouth 2 (two) times daily as needed for heartburn.    Historical Provider, MD  saccharomyces boulardii (FLORASTOR) 250 MG capsule Take 1 capsule (250 mg total) by mouth 2 (two) times daily. 07/08/15   Donne Hazel, MD  sodium chloride (OCEAN) 0.65 % SOLN nasal spray Place 1 spray into  both nostrils 3 (three) times daily as needed for congestion.    Historical Provider, MD  timolol (BETIMOL) 0.5 % ophthalmic solution Place 1 drop into both eyes every morning.     Historical Provider, MD  warfarin (COUMADIN) 5 MG tablet Take as directed by [facility] 02/16/16   Jettie Booze, MD    Physical Exam: Vitals:   05/10/16 1445 05/10/16 1456 05/10/16 1500 05/10/16 1515  BP: 101/55  (!) 100/48 (!) 114/52  Pulse: 86  87 83  Resp: 23  22  21  Temp:  98.4 F (36.9 C)    TempSrc:  Oral    SpO2: 100%  100% 100%  Weight:      Height:         General:  Appears calm and comfortable No acute distress Eyes:  PERRL, EOMI, normal lids, iris ENT:  grossly normal hearing, lips & tongue, his membranes of her mouth are pink slightly dry Neck:  no LAD, masses or thyromegaly Cardiovascular:  Tachycardic but regular, no m/r/g. No LE edema.  Respiratory:  CTA bilaterally, no w/r/r. Normal respiratory effort. Abdomen:  soft, ntnd, positive bowel sounds no guarding or rebounding Skin:  no rash or induration seen on limited exam Musculoskeletal:  grossly normal tone BUE/BLE, good ROM, no bony abnormality, left upper extremity dialysis fistula site unremarkable Psychiatric:  grossly normal mood and affect, speech fluent and appropriate, AOx3 Neurologic:  CN 2-12 grossly intact, moves all extremities in coordinated fashion, sensation intact  Labs on Admission: I have personally reviewed following labs and imaging studies  CBC:  Recent Labs Lab 05/10/16 1133  WBC 21.5*  HGB 9.5*  HCT 30.2*  MCV 92.9  PLT 716   Basic Metabolic Panel:  Recent Labs Lab 05/10/16 1133  NA 133*  K 3.7  CL 96*  CO2 25  GLUCOSE 127*  BUN 27*  CREATININE 5.86*  CALCIUM 8.6*  PHOS 2.6   GFR: Estimated Creatinine Clearance: 9.2 mL/min (by C-G formula based on SCr of 5.86 mg/dL (H)). Liver Function Tests:  Recent Labs Lab 05/10/16 1133  ALBUMIN 2.7*   No results for input(s): LIPASE,  AMYLASE in the last 168 hours. No results for input(s): AMMONIA in the last 168 hours. Coagulation Profile:  Recent Labs Lab 05/10/16 1133  INR 2.12   Cardiac Enzymes: No results for input(s): CKTOTAL, CKMB, CKMBINDEX, TROPONINI in the last 168 hours. BNP (last 3 results) No results for input(s): PROBNP in the last 8760 hours. HbA1C: No results for input(s): HGBA1C in the last 72 hours. CBG:  Recent Labs Lab 05/10/16 1107  GLUCAP 130*   Lipid Profile: No results for input(s): CHOL, HDL, LDLCALC, TRIG, CHOLHDL, LDLDIRECT in the last 72 hours. Thyroid Function Tests: No results for input(s): TSH, T4TOTAL, FREET4, T3FREE, THYROIDAB in the last 72 hours. Anemia Panel: No results for input(s): VITAMINB12, FOLATE, FERRITIN, TIBC, IRON, RETICCTPCT in the last 72 hours. Urine analysis:    Component Value Date/Time   COLORURINE RED (A) 05/10/2016 1253   APPEARANCEUR TURBID (A) 05/10/2016 1253   LABSPEC 1.010 05/10/2016 1253   PHURINE 7.0 05/10/2016 1253   GLUCOSEU NEGATIVE 05/10/2016 1253   HGBUR LARGE (A) 05/10/2016 1253   BILIRUBINUR NEGATIVE 05/10/2016 1253   BILIRUBINUR neg 09/18/2014 0920   KETONESUR NEGATIVE 05/10/2016 1253   PROTEINUR >300 (A) 05/10/2016 1253   UROBILINOGEN 0.2 05/02/2015 2020   NITRITE NEGATIVE 05/10/2016 1253   LEUKOCYTESUR LARGE (A) 05/10/2016 1253    Creatinine Clearance: Estimated Creatinine Clearance: 9.2 mL/min (by C-G formula based on SCr of 5.86 mg/dL (H)).  Sepsis Labs: @LABRCNTIP (procalcitonin:4,lacticidven:4) )No results found for this or any previous visit (from the past 240 hour(s)).   Radiological Exams on Admission: Dg Chest 2 View  Result Date: 05/10/2016 CLINICAL DATA:  Shortness of breath. EXAM: CHEST  2 VIEW COMPARISON:  Radiographs of January 30, 2016. FINDINGS: Stable cardiomegaly. No pneumothorax is noted. Hypoinflation of the lungs is noted. Right lung is clear. Increased left basilar opacity is noted concerning for  atelectasis or infiltrate. No significant pleural  effusion is noted. Bony thorax is unremarkable. Right internal jugular dialysis catheter noted on prior exam has been removed. IMPRESSION: Mildly increased left basilar opacity is noted concerning for worsening atelectasis or infiltrate. Electronically Signed   By: Marijo Conception, M.D.   On: 05/10/2016 12:14    EKG: Independently reviewed. Sinus rhythm Right axis deviation Prolonged QT interval No significant change since last tracing  Assessment/Plan Principal Problem:   Sepsis ([place]) Active Problems:   Essential hypertension, benign   Severe protein-calorie malnutrition ([facility])   Anemia of renal disease   Urinary tract infectious disease   PAF (paroxysmal atrial fibrillation) ([place])   HCAP (healthcare-associated pneumonia)   PNA (pneumonia)   #1. Sepsis. Likely related to urinary tract infection and/or pneumonia per x-ray. WBCs 21.5 patient is febrile with a temperature of 102.2 tachycardia with tachypnea. Lactic acid within the limits of normal -Admit to telemetry -Follow blood cultures -Sputum culture -Follow urine culture -IV fluids per protocol being in mind end-stage renal disease but patient does make urine. He received 1 L in the emergency department we'll continue normal saline at 50 mL an hour for 10 more hours -Antibiotics for healthcare associated pneumonia -Strep pneumo urine antigen -Hold home antihypertensives -trend lactic acid -Up each CBC and BMP in the morning  #2. Healthcare associated pneumonia -See #2  #3. Urinary tract infection disease. With history of frequent UTIs. She states she had had a urinary tract infection 4 months. History of nephrostomy tubes that she says a been replaced -Follow urine culture -Antibiotics as noted above -Supportive therapy  #4. End-stage renal disease. She is a Tuesday Thursday dialysis patient. She still makes urine. She states her dialysis schedule is different this week due to  the holiday. She states she was unable to complete recent session and has had extra sessions due to upcoming holiday which has left her feel weak. Current creatinine pending. -Follow CMET -doubt she needs dialysis today -Nephrology contacted per ED physician  #5. Hypertension. Patient was hypotensive on it admission as noted above. Be related to extra dialysis over the last couple of days. Home medications include metoprolol -We'll hold this for now -Monitor closely -Resume as indicated  6. PAF. chadvasc score 4. On Coumadin. INR 2.12. EKG as noted above. Home meds also include amiodarone -coumadin per pharmacy -continue amiodaron  #7. Anemia of chronic disease. Myoglobin 9.5. Chart review indicates this is close to baseline. No signs symptoms of active bleeding -Monitor    DVT prophylaxis: heparin  Code Status: full  Family Communication: none present  Disposition Plan: home when ready  Consults called: requested ED MD call nephrology  Admission status: inpatient    Radene Gunning MD [facility]  If 7PM-7AM, please contact night-coverage [email or web address] Password [email or web address]  05/10/2016, 4:12 PM

## 2016-05-10 NOTE — ED Notes (Signed)
Spoke with Doctor then phlebotomy regarding CMP drawn earlier today and delayed results.

## 2016-05-10 NOTE — ED Notes (Signed)
Observed [name] perform an in and out cathether.  Sent urine sample to the main lab.

## 2016-05-10 NOTE — ED Notes (Signed)
EDP notified of patient's low BP.  Patient unable to urinate. Having runny stools.

## 2016-05-10 NOTE — ED Notes (Signed)
EKG given to Dr. Cardama  

## 2016-05-10 NOTE — Progress Notes (Addendum)
Pharmacy Antibiotic Note + Anticoagulation Note  [name] is a 70 y.o. female admitted on 05/10/2016 after falling at home.  Pt  Tm/24h 104,  LA wnl, pt is ESRD on HD-TuThSat. Initiating abx for possible pneumonia. Pt received one dose of Rocephin earlier today. Will still schedule cefepime for today due to broader coverage.  Pt is on warfarin PTA for AFib. Current INR 2.1 PTA dose: 5 mg on Mon/Wed/Fri and 2.5 mg on all other days   Plan: -Vancomycin 1750 mg IV x 1 then 1 g IV qHD -Continue cefepime 1g/24h - while off schedule for holiday week  -Monitor HD schedule and tolerance -F/u cultures -VR as needed -Warfarin 2.5 mg po x1 -Daily INR   Height: 5\' 3"  (160 cm) Weight: 193 lb (87.5 kg) IBW/kg (Calculated) : 52.4  Temp (24hrs), Avg:100.3 F (37.9 C), Min:98.4 F (36.9 C), Max:102.2 F (39 C)   Recent Labs Lab 05/10/16 1133 05/10/16 1142  WBC 21.5*  --   LATICACIDVEN  --  1.01    CrCl cannot be calculated (Patient's most recent lab result is older than the maximum 21 days allowed.).    Allergies  Allergen Reactions  . Vicodin [Hydrocodone-Acetaminophen] Nausea And Vomiting  . Chlorhexidine Rash    Sunburn    rash  . Percocet [Oxycodone-Acetaminophen] Nausea And Vomiting    Antimicrobials this admission: 11/21 vancomycin > 11/21 cefepime > 11/21 ceftriaxone x1   Dose adjustments this admission: NA   Microbiology results: 11/21 blood cx:  11/21 urine cx: 11/21 resp cx:   Thank you for allowing pharmacy to be a part of this patient's care.  Harvel Quale 05/10/2016 3:57 PM

## 2016-05-10 NOTE — ED Notes (Signed)
Spoke with Pharmacist will change time for Maxipime and to give Vancomycin.

## 2016-05-10 NOTE — ED Notes (Signed)
Ice water given to pt per Marzetta Board [employer])

## 2016-05-10 NOTE — ED Triage Notes (Addendum)
Per EMS, patient was at home and fell in floor.  She lives with her daughter. Pt was trying to get to phone and fell due to weakness.  Daughter stated that pt has had an increase in weakness for the past 12 hours.  Temp 104., 99/52, HR 104, 3L 97%, Sinus Tach on EKG.  Medical history of ESRD.  No blood thinners noted.  1000 mg of tylenol given en route.  22G IV in right hand.  Pt denies pain.  Pt denies hitting head.  Pt denies numbness.

## 2016-05-10 NOTE — ED Notes (Addendum)
Spoke with Phlebotomy will draw blood and blood cultures.

## 2016-05-10 NOTE — ED Notes (Signed)
Pericare provided to pt and her soiled underwear was discarded.  Pt placed on bedpan and is currently having diarrhea.

## 2016-05-10 NOTE — ED Notes (Signed)
Ice water given to pt per [name], [name])

## 2016-05-11 ENCOUNTER — Inpatient Hospital Stay (HOSPITAL_COMMUNITY): Payer: Medicare Other

## 2016-05-11 ENCOUNTER — Encounter (HOSPITAL_COMMUNITY): Payer: Self-pay | Admitting: General Practice

## 2016-05-11 DIAGNOSIS — I10 Essential (primary) hypertension: Secondary | ICD-10-CM

## 2016-05-11 DIAGNOSIS — D631 Anemia in chronic kidney disease: Secondary | ICD-10-CM

## 2016-05-11 DIAGNOSIS — A419 Sepsis, unspecified organism: Principal | ICD-10-CM

## 2016-05-11 DIAGNOSIS — E860 Dehydration: Secondary | ICD-10-CM

## 2016-05-11 LAB — COMPREHENSIVE METABOLIC PANEL
ALT: 19 U/L (ref 14–54)
AST: 17 U/L (ref 15–41)
Albumin: 2.4 g/dL — ABNORMAL LOW (ref 3.5–5.0)
Alkaline Phosphatase: 79 U/L (ref 38–126)
Anion gap: 11 (ref 5–15)
BUN: 32 mg/dL — ABNORMAL HIGH (ref 6–20)
CO2: 26 mmol/L (ref 22–32)
Calcium: 8.9 mg/dL (ref 8.9–10.3)
Chloride: 98 mmol/L — ABNORMAL LOW (ref 101–111)
Creatinine, Ser: 6.94 mg/dL — ABNORMAL HIGH (ref 0.44–1.00)
GFR calc Af Amer: 6 mL/min — ABNORMAL LOW (ref >60)
GFR calc non Af Amer: 5 mL/min — ABNORMAL LOW (ref >60)
Glucose, Bld: 87 mg/dL (ref 65–99)
Potassium: 3.6 mmol/L (ref 3.5–5.1)
Sodium: 135 mmol/L (ref 135–145)
Total Bilirubin: 0.5 mg/dL (ref 0.3–1.2)
Total Protein: 5.5 g/dL — ABNORMAL LOW (ref 6.5–8.1)

## 2016-05-11 LAB — URINE CULTURE: Culture: NO GROWTH

## 2016-05-11 LAB — CBC
HCT: 28.1 % — ABNORMAL LOW (ref 36.0–46.0)
Hemoglobin: 9 g/dL — ABNORMAL LOW (ref 12.0–15.0)
MCH: 29.8 pg (ref 26.0–34.0)
MCHC: 32 g/dL (ref 30.0–36.0)
MCV: 93 fL (ref 78.0–100.0)
Platelets: 210 10*3/uL (ref 150–400)
RBC: 3.02 MIL/uL — ABNORMAL LOW (ref 3.87–5.11)
RDW: 15.6 % — ABNORMAL HIGH (ref 11.5–15.5)
WBC: 14.9 10*3/uL — ABNORMAL HIGH (ref 4.0–10.5)

## 2016-05-11 LAB — STREP PNEUMONIAE URINARY ANTIGEN: Strep Pneumo Urinary Antigen: NEGATIVE

## 2016-05-11 LAB — PROTIME-INR
INR: 2.48
Prothrombin Time: 27.3 seconds — ABNORMAL HIGH (ref 11.4–15.2)

## 2016-05-11 LAB — HIV ANTIBODY (ROUTINE TESTING W REFLEX): HIV Screen 4th Generation wRfx: NONREACTIVE

## 2016-05-11 LAB — MRSA PCR SCREENING: MRSA by PCR: POSITIVE — AB

## 2016-05-11 LAB — HEPATITIS B SURFACE ANTIGEN: Hepatitis B Surface Ag: NEGATIVE

## 2016-05-11 MED ORDER — SODIUM CHLORIDE 0.9 % IV SOLN
62.5000 mg | Freq: Once | INTRAVENOUS | Status: AC
  Administered 2016-05-11: 62.5 mg via INTRAVENOUS
  Filled 2016-05-11 (×2): qty 5

## 2016-05-11 MED ORDER — CHLORHEXIDINE GLUCONATE CLOTH 2 % EX PADS: 6.0000 | MEDICATED_PAD | Freq: Every day | CUTANEOUS | Status: DC

## 2016-05-11 MED ORDER — RENA-VITE PO TABS
1.0000 | ORAL_TABLET | Freq: Every day | ORAL | Status: DC
  Administered 2016-05-11: 1 via ORAL
  Filled 2016-05-11: qty 1

## 2016-05-11 MED ORDER — DARBEPOETIN ALFA 60 MCG/0.3ML IJ SOSY
PREFILLED_SYRINGE | INTRAMUSCULAR | Status: AC
  Administered 2016-05-11: 60 ug via INTRAVENOUS
  Filled 2016-05-11: qty 0.3

## 2016-05-11 MED ORDER — VANCOMYCIN HCL IN DEXTROSE 750-5 MG/150ML-% IV SOLN
INTRAVENOUS | Status: AC
  Administered 2016-05-11: 750 mg via INTRAVENOUS
  Filled 2016-05-11: qty 150

## 2016-05-11 MED ORDER — WARFARIN SODIUM 5 MG PO TABS
5.0000 mg | ORAL_TABLET | Freq: Once | ORAL | Status: AC
  Administered 2016-05-11: 5 mg via ORAL
  Filled 2016-05-11: qty 1

## 2016-05-11 MED ORDER — VANCOMYCIN HCL IN DEXTROSE 750-5 MG/150ML-% IV SOLN
750.0000 mg | Freq: Once | INTRAVENOUS | Status: AC
  Administered 2016-05-11: 750 mg via INTRAVENOUS
  Filled 2016-05-11: qty 150

## 2016-05-11 MED ORDER — DARBEPOETIN ALFA 60 MCG/0.3ML IJ SOSY
60.0000 ug | PREFILLED_SYRINGE | Freq: Once | INTRAMUSCULAR | Status: AC
  Administered 2016-05-11: 60 ug via INTRAVENOUS

## 2016-05-11 MED ORDER — MUPIROCIN 2 % EX OINT
1.0000 "application " | TOPICAL_OINTMENT | Freq: Two times a day (BID) | CUTANEOUS | Status: DC
  Administered 2016-05-11 – 2016-05-12 (×3): 1 via NASAL
  Filled 2016-05-11: qty 22

## 2016-05-11 NOTE — Progress Notes (Signed)
[facility] PROGRESS NOTE  [name]:[id number] DOB: Jan 20, 1946 DOA: 05/10/2016   PCP: [name], MD     Assessment/Plan: Principal Problem:   Sepsis ([place]) Active Problems:   Essential hypertension, benign   Severe protein-calorie malnutrition ([place])   Anemia of renal disease   Urinary tract infectious disease   PAF (paroxysmal atrial fibrillation) ([place])   HCAP (healthcare-associated pneumonia)   PNA (pneumonia)   Dehydration   [name] is a very pleasant 70 y.o. female with medical history significant for end-stage renal disease Tuesday Thursday Saturday client, hypertension, PAF, cardiomyopathy, a tract infection disease status post stents presents to the emergency Department chief complaint of generalized weakness and fall and fever. Initial evaluation in the emergency department reveals a urinary tract infection a chest x-ray concerning for pneumonia  Assessment and plan  #1. Sepsis. Likely related to urinary tract infection and/or pneumonia per x-ray. WBCs 21.5>14.9, patient is febrile with a temperature of 102.2 tachycardia with tachypnea. Lactic acid within the limits of normal  Continue telemetry -Follow blood cultures -Sputum culture -Follow urine culture -IV fluids per protocol being in mind end-stage renal disease but patient does make urine. He received 1 L in the emergency department we'll continue normal saline at 50 mL an hour for 10 more hours -Antibiotics for healthcare associated pneumonia -Strep pneumo urine antigen -Hold home antihypertensives -trend lactic acid -Up each CBC and BMP in the morning  #2. Healthcare associated pneumonia -See #2  #3. Urinary tract infection disease. With history of frequent UTIs. She states she had had a urinary tract infection 4 months. History of nephrostomy tubes that she says a been replaced -Follow urine culture -Antibiotics as noted above Renal ultrasound to rule out  obstruction   #4. End-stage renal disease. She is a Tuesday Thursday dialysis patient. She still makes urine. She states her dialysis schedule is different this week due to the holiday. She states she was unable to complete recent session and has had extra sessions due to upcoming holiday which has left her feel weak. Current creatinine pending. -Follow CMET  Does not appear to be volume overloaded -Nephrology contacted again,   discussed with Dr. Mercy Moore   #5. Hypertension. Patient was hypotensive on it admission as noted above. Be related to extra dialysis over the last couple of days. Home medications include metoprolol -We'll hold this for now -Monitor closely -Resume as indicated  6. PAF. chadvasc score 4. On Coumadin. INR 2.12. EKG as noted above. Home meds also include amiodarone -coumadin per pharmacy -continue amiodarone  #7. Anemia of chronic disease. Myoglobin 9.5>9.0. Chart review indicates this is close to baseline. No signs symptoms of active bleeding -Monitor  #8 dilated cardiomyopathy with EF of 20-25%, now improved     DVT prophylaxsis    Code Status:  Full code     Family Communication: Discussed in detail with the patient, all imaging results, lab results explained to the patient   Disposition Plan:  1-2 days      Consultants:  Nephrology  Procedures:  None  Antibiotics: Anti-infectives    Start     Dose/Rate Route Frequency Ordered Stop   05/10/16 2200  ceFEPIme (MAXIPIME) 1 g in dextrose 5 % 50 mL IVPB     1 g 100 mL/hr over 30 Minutes Intravenous Every 24 hours 05/10/16 1654     05/10/16 1700  ceFEPIme (MAXIPIME) 1 g in dextrose 5 % 50 mL IVPB  Status:  Discontinued  1 g 100 mL/hr over 30 Minutes Intravenous  Once 05/10/16 1650 05/10/16 1654   05/10/16 1630  vancomycin (VANCOCIN) 1,750 mg in sodium chloride 0.9 % 500 mL IVPB     1,750 mg 250 mL/hr over 120 Minutes Intravenous  Once 05/10/16 1554 05/10/16 2203   05/10/16 1600   ceFEPIme (MAXIPIME) 1 g in dextrose 5 % 50 mL IVPB  Status:  Discontinued     1 g 100 mL/hr over 30 Minutes Intravenous Every 8 hours 05/10/16 1546 05/10/16 1652   05/10/16 1230  cefTRIAXone (ROCEPHIN) 1 g in dextrose 5 % 50 mL IVPB     1 g 100 mL/hr over 30 Minutes Intravenous  Once 05/10/16 1223 05/10/16 1336         HPI/Subjective: Patient states that she feels better, afebrile overnight, denies any chest pain shortness of breath or cough  Objective: Vitals:   05/11/16 0051 05/11/16 0053 05/11/16 0108 05/11/16 0553  BP: (!) 103/39 (!) 84/44 (!) 118/56 (!) 110/93  Pulse: 83   85  Resp:    20  Temp: 97.9 F (36.6 C)   98.5 F (36.9 C)  TempSrc: Oral   Oral  SpO2: 96%   100%  Weight:    83.7 kg (184 lb 9.6 oz)  Height:        Intake/Output Summary (Last 24 hours) at 05/11/16 0905 Last data filed at 05/11/16 0602  Gross per 24 hour  Intake          1904.67 ml  Output              101 ml  Net          1803.67 ml    Exam:  Examination:  General exam: Appears calm and comfortable  Respiratory system: Clear to auscultation. Respiratory effort normal. Cardiovascular system: S1 & S2 heard, RRR. No JVD, murmurs, rubs, gallops or clicks. No pedal edema. Gastrointestinal system: Abdomen is nondistended, soft and nontender. No organomegaly or masses felt. Normal bowel sounds heard. Central nervous system: Alert and oriented. No focal neurological deficits. Extremities: Symmetric 5 x 5 power. Skin: No rashes, lesions or ulcers Psychiatry: Judgement and insight appear normal. Mood & affect appropriate.     Data Reviewed: I have personally reviewed following labs and imaging studies  Micro Results Recent Results (from the past 240 hour(s))  MRSA PCR Screening     Status: Abnormal   Collection Time: 05/11/16  3:01 AM  Result Value Ref Range Status   MRSA by PCR POSITIVE (A) NEGATIVE Final    Comment:        The GeneXpert MRSA Assay (FDA approved for NASAL  specimens only), is one component of a comprehensive MRSA colonization surveillance program. It is not intended to diagnose MRSA infection nor to guide or monitor treatment for MRSA infections. RESULT CALLED TO, READ BACK BY AND VERIFIED WITH: S THOMAS,RN @0519  05/11/16 MKELLY,MLT     Radiology Reports Dg Chest 2 View  Result Date: 05/10/2016 CLINICAL DATA:  Shortness of breath. EXAM: CHEST  2 VIEW COMPARISON:  Radiographs of January 30, 2016. FINDINGS: Stable cardiomegaly. No pneumothorax is noted. Hypoinflation of the lungs is noted. Right lung is clear. Increased left basilar opacity is noted concerning for atelectasis or infiltrate. No significant pleural effusion is noted. Bony thorax is unremarkable. Right internal jugular dialysis catheter noted on prior exam has been removed. IMPRESSION: Mildly increased left basilar opacity is noted concerning for worsening atelectasis or infiltrate. Electronically Signed   By:  Marijo Conception, M.D.   On: 05/10/2016 12:14   Dg Chest Port 1 View  Result Date: 05/11/2016 CLINICAL DATA:  Bibasilar crackles on physical exam EXAM: PORTABLE CHEST 1 VIEW COMPARISON:  Chest x-ray of 05/10/2016 FINDINGS: Aeration of the lungs has improved. Opacity remains particularly at the left lung base most consistent with atelectasis and possibly small left effusion. The right lung is clear. Cardiomegaly is stable. IMPRESSION: 1. Improved aeration. 2. Persistent opacity at the left lung base most consistent with atelectasis and possible small effusion Electronically Signed   By: Ivar Drape M.D.   On: 05/11/2016 08:06     CBC  Recent Labs Lab 05/10/16 1133 05/10/16 1648 05/11/16 0457  WBC 21.5*  --  14.9*  HGB 9.5* 8.8* 9.0*  HCT 30.2* 26.0* 28.1*  PLT 166  --  210  MCV 92.9  --  93.0  MCH 29.2  --  29.8  MCHC 31.5  --  32.0  RDW 15.2  --  15.6*    Chemistries   Recent Labs Lab 05/10/16 1133 05/10/16 1638 05/10/16 1648 05/11/16 0457  NA 133* 131*  131* 135  K 3.7 3.5 3.6 3.6  CL 96* 94* 96* 98*  CO2 25 26  --  26  GLUCOSE 127* 107* 101* 87  BUN 27* 28* 29* 32*  CREATININE 5.86* 5.93* 6.20* 6.94*  CALCIUM 8.6* 8.3*  --  8.9  AST  --  22  --  17  ALT  --  21  --  19  ALKPHOS  --  81  --  79  BILITOT  --  0.6  --  0.5   ------------------------------------------------------------------------------------------------------------------ estimated creatinine clearance is 7.6 mL/min (by C-G formula based on SCr of 6.94 mg/dL (H)). ------------------------------------------------------------------------------------------------------------------ No results for input(s): HGBA1C in the last 72 hours. ------------------------------------------------------------------------------------------------------------------ No results for input(s): CHOL, HDL, LDLCALC, TRIG, CHOLHDL, LDLDIRECT in the last 72 hours. ------------------------------------------------------------------------------------------------------------------ No results for input(s): TSH, T4TOTAL, T3FREE, THYROIDAB in the last 72 hours.  Invalid input(s): FREET3 ------------------------------------------------------------------------------------------------------------------ No results for input(s): VITAMINB12, FOLATE, FERRITIN, TIBC, IRON, RETICCTPCT in the last 72 hours.  Coagulation profile  Recent Labs Lab 05/10/16 1133 05/11/16 0457  INR 2.12 2.48    No results for input(s): DDIMER in the last 72 hours.  Cardiac Enzymes No results for input(s): CKMB, TROPONINI, MYOGLOBIN in the last 168 hours.  Invalid input(s): CK ------------------------------------------------------------------------------------------------------------------ Invalid input(s): POCBNP   CBG:  Recent Labs Lab 05/10/16 1107  GLUCAP 130*       Studies: Dg Chest 2 View  Result Date: 05/10/2016 CLINICAL DATA:  Shortness of breath. EXAM: CHEST  2 VIEW COMPARISON:  Radiographs of January 30, 2016. FINDINGS: Stable cardiomegaly. No pneumothorax is noted. Hypoinflation of the lungs is noted. Right lung is clear. Increased left basilar opacity is noted concerning for atelectasis or infiltrate. No significant pleural effusion is noted. Bony thorax is unremarkable. Right internal jugular dialysis catheter noted on prior exam has been removed. IMPRESSION: Mildly increased left basilar opacity is noted concerning for worsening atelectasis or infiltrate. Electronically Signed   By: Marijo Conception, M.D.   On: 05/10/2016 12:14   Dg Chest Port 1 View  Result Date: 05/11/2016 CLINICAL DATA:  Bibasilar crackles on physical exam EXAM: PORTABLE CHEST 1 VIEW COMPARISON:  Chest x-ray of 05/10/2016 FINDINGS: Aeration of the lungs has improved. Opacity remains particularly at the left lung base most consistent with atelectasis and possibly small left effusion. The right lung is clear. Cardiomegaly is  stable. IMPRESSION: 1. Improved aeration. 2. Persistent opacity at the left lung base most consistent with atelectasis and possible small effusion Electronically Signed   By: Ivar Drape M.D.   On: 05/11/2016 08:06      Lab Results  Component Value Date   HGBA1C 5.6 11/13/2014   Lab Results  Component Value Date   LDLCALC 63 07/04/2015   CREATININE 6.94 (H) 05/11/2016       Scheduled Meds: . amiodarone  200 mg Oral Daily  . calcium acetate  667 mg Oral TID WC  . ceFEPime (MAXIPIME) IV  1 g Intravenous Q24H  . Chlorhexidine Gluconate Cloth  6 each Topical Q0600  . famotidine  10 mg Oral Daily  . latanoprost  1 drop Both Eyes QHS  . mupirocin ointment  1 application Nasal BID  . saccharomyces boulardii  250 mg Oral BID  . sodium chloride flush  3 mL Intravenous Q12H  . timolol  1 drop Both Eyes q morning - 10a  . Warfarin - Pharmacist Dosing Inpatient   Does not apply q1800   Continuous Infusions:   LOS: 1 day    Time spent: >30 MINS    [facility] Pager  [phone]. If 7PM-7AM, please contact night-coverage at [email or web address], password [facility] 05/11/2016, 9:05 AM  LOS: 1 day

## 2016-05-11 NOTE — Procedures (Signed)
Pt seen on HD.  Ap 150 Vp 180  BFR 400.  SBP 104.  Tolerating HD well so far.

## 2016-05-11 NOTE — Progress Notes (Signed)
ANTICOAGULATION CONSULT NOTE - Follow Up Consult  Pharmacy Consult for Coumadin  Indication: atrial fibrillation  Allergies  Allergen Reactions  . Vicodin [Hydrocodone-Acetaminophen] Nausea And Vomiting  . Chlorhexidine Rash    Sunburn    rash  . Percocet [Oxycodone-Acetaminophen] Nausea And Vomiting    Patient Measurements: Height: 5\' 3"  (160 cm) Weight: 184 lb 9.6 oz (83.7 kg) (scale b) IBW/kg (Calculated) : 52.4  Vital Signs: Temp: 98.5 F (36.9 C) (11/22 0553) Temp Source: Oral (11/22 0553) BP: 110/93 (11/22 0553) Pulse Rate: 85 (11/22 0553)  Labs:  Recent Labs  05/10/16 1133 05/10/16 1638 05/10/16 1648 05/11/16 0457  HGB 9.5*  --  8.8* 9.0*  HCT 30.2*  --  26.0* 28.1*  PLT 166  --   --  210  LABPROT 24.1*  --   --  27.3*  INR 2.12  --   --  2.48  CREATININE 5.86* 5.93* 6.20* 6.94*    Estimated Creatinine Clearance: 7.6 mL/min (by C-G formula based on SCr of 6.94 mg/dL (H)).  Assessment: 70 year old female on Coumadin PTA for Afib Dose PTA = 5 mg MWF, 2.5 mg other days INR therapeutic on admission  INR today = 2.48  Goal of Therapy:  INR 2-3 Monitor platelets by anticoagulation protocol: Yes   Plan:  Coumadin 5 mg po x 1 dose tonight per home regimen Daily INR  Thank you Anette Guarneri, PharmD [phone]  05/11/2016,10:21 AM

## 2016-05-11 NOTE — Consult Note (Signed)
[facility] Renal Consultation Note    Indication for Consultation:  Management of ESRD/hemodialysis; anemia, hypertension/volume and secondary hyperparathyroidism PCP:  HPI: [name] is a 70 y.o. female with ESRD, CM, EF 20-25%, afib, prior hx recurrent UTIx right hydro  with ureteral stent/nephrostomy - all removed.  She felt bad Monday evening and went to bed.  When she got up Tuesday, she had on socks then slipped and fell while walking on her laminate floor.  She crawled to the phone and called her daughter.  She denied N, V, fever or chills.  Her post HD temp was 98.8 on Monday. She had no dysuria, urgency or frequency. No foul odor or hematuria. She only had a slight dry no SOB or CP. She tells me she normally has no significant symptoms when she has a UTI.   Upon presentation yesterday, she had a temp to 100.2 then later 102. BP 111/48, WBC up to 21.5 now down to 14.9, hgb 9, CXR ? Opacity LLL. She feels much better today.  Past Medical History:  Diagnosis Date  . Cardiomyopathy ([place])    a. h/o LV dysfunction EF 20-25% in 2013 due to sepsis.>> improved to normal   . Chronic diastolic CHF (congestive heart failure) ([place])    10/ 2013 in setting of septic shock  . Complication of anesthesia    use a little anesthesia , per patient MD states she quit breathing (2016); hard to wake up  . ESRD (end stage renal disease) ([place])    dialysis Tues, Thurs, Sat  . GERD (gastroesophageal reflux disease)   . Glaucoma   . Glaucoma   . H/O hiatal hernia    a. CT 2017: large gastric hiatal hernia.  Marland Kitchen Headache(784.0)    migraine  . History of blood transfusion 04/13/2015   . History of echocardiogram    a. Echo 6/17: EF 60-65%, normal wall motion, mild MR, atrial septal lipomatous hypertrophy, PASP 34 mmHg, possible trivial free-flowing pericardial effusion along RV free wall // b. Echo 5/17: Mild LVH, EF 55-60%, normal wall motion, grade 1 diastolic dysfunction, trivial MR,  severe LAE, mild RAE, PASP 42 mmHg  . History of nephrostomy 04/11/2015    currently inplace 04/28/2015   . History of nuclear stress test    a. Myoview 1/14 - Marked ischemia in the basal anterior, mid anterior, apical septal and apical inferior regions, EF 63% >> LHC normal   . Hyperlipidemia   . Hypertension    medication removed from regimen due to low blood pressure   . Iron deficiency    hx  . Nephrolithiasis 2002, 2006   bilateral  . Normal coronary arteries 2014   a. LHC in 1/14: normal coronary arteries  . PAF (paroxysmal atrial fibrillation) ([place])    a. 10/ 2013  in setting of Septic Shock //  b. recurrent during admit for pneumonia, L effusion >> placed on Amiodarone // Coumadin for anticoagulation  . Pneumonia    dx 10-06-2014 per CXR--  on 10-27-2014 pt states finished antibiotic and denies cough or fever  . S/P hemodialysis catheter insertion ([place]) 04/11/2015    right anterior chest , only used once   . Sigmoid diverticulosis    Past Surgical History:  Procedure Laterality Date  . ABDOMINAL HYSTERECTOMY    . AV FISTULA PLACEMENT Left 06/02/2015   Procedure: BRACHIOCEPHALIC ARTERIOVENOUS (AV) FISTULA CREATION ;  Surgeon: Conrad [place], MD;  Location: [place];  Service: Vascular;  Laterality: Left;  .  BASCILIC VEIN TRANSPOSITION Left 07/27/2015   Procedure: FIRST STAGE BASILIC VEIN TRANSPOSITION LEFT UPPER ARM;  Surgeon: Conrad [place], MD;  Location: [place];  Service: Vascular;  Laterality: Left;  . BASCILIC VEIN TRANSPOSITION Left 09/2015   second phase  . BASCILIC VEIN TRANSPOSITION Left 10/12/2015   Procedure: SECOND STAGE BASILIC VEIN TRANSPOSITION LEFT ARM;  Surgeon: Conrad Liscomb, MD;  Location: [place];  Service: Vascular;  Laterality: Left;  . BREAST BIOPSY Left 08/23/07   benign fibrocystic with duct ectasia  . CARDIAC CATHETERIZATION  07-11-2012  dr Irish Lack   Abnormal stress test/   normal coronary arteries/  LVEDP  61mmHg  . CARDIOVASCULAR STRESS TEST  06-26-2012  dr  Irish Lack   marked ischemia in the basal anterior, mid anterior, apical inferior regions/  normal LVF, ef 63%  . CATARACT EXTRACTION W/ INTRAOCULAR LENS  IMPLANT, BILATERAL    . CYSTOSCOPY W/ URETERAL STENT PLACEMENT  04/04/2012   Procedure: CYSTOSCOPY WITH RETROGRADE PYELOGRAM/URETERAL STENT PLACEMENT;  Surgeon: Ailene Rud, MD;  Location: [place];  Service: Urology;  Laterality: Left;  . CYSTOSCOPY W/ URETERAL STENT PLACEMENT Bilateral 05/04/2015   Procedure: CYSTOSCOPY WITH BILATERAL RETROGRADE PYELOGRAM/ WITH INTERPRETATION, EXCHANGE OF RIGHT URETERAL STENT REPLACEMENT AND PLACEMENT LEFT URETERAL STENT PLACEMENT EXAMINATION OF VAGINA;  Surgeon: Carolan Clines, MD;  Location: [facility];  Service: Urology;  Laterality: Bilateral;  . CYSTOSCOPY WITH STENT PLACEMENT Right 10/28/2014   Procedure: RIGHT URETERAL STENT PLACEMENT;  Surgeon: Irine Seal, MD;  Location: [facility];  Service: Urology;  Laterality: Right;  . CYSTOSCOPY WITH STENT PLACEMENT Right 02/26/2015   Procedure: CYSTOSCOPY RETROGRADE PYELOGRAM WITH STENT PLACEMENT;  Surgeon: Cleon Gustin, MD;  Location: [facility];  Service: Urology;  Laterality: Right;  . CYSTOSCOPY/RETROGRADE/URETEROSCOPY/STONE EXTRACTION WITH BASKET Right 11/21/2014   Procedure: CYSTOSCOPY/RIGHT RETROGRADE PYELOGRAM/RIGHT URETEROSCOPY/BASKET EXTRACTION/RIGHT PYELOSCOPY/LASER OF STONE/RIGHT DOUBLE J STENT;  Surgeon: Carolan Clines, MD;  Location: [place];  Service: Urology;  Laterality: Right;  . EXTRACORPOREAL SHOCK WAVE LITHOTRIPSY  05-28-2012  &  10-08-2012  . HOLMIUM LASER APPLICATION Right 09/18/2876   Procedure: HOLMIUM LASER APPLICATION;  Surgeon: Carolan Clines, MD;  Location[facility];  Service: Urology;  Laterality: Right;  . IR GENERIC HISTORICAL  02/01/2016   IR NEPHROSTOMY EXCHANGE RIGHT 02/01/2016 Greggory Keen, MD [facility]  . IR GENERIC HISTORICAL  02/24/2016   IR PATIENT EVAL TECH 0-60  MINS 02/24/2016 Aletta Edouard, MD [facility]  . KNEE ARTHROSCOPY Left 02-14-2003  . LAPAROSCOPIC CHOLECYSTECTOMY  03-23-2005  . TOTAL ABDOMINAL HYSTERECTOMY W/ BILATERAL SALPINGOOPHORECTOMY  1993   secondary to fibroids  . TRANSTHORACIC ECHOCARDIOGRAM  04-09-2012   normal LVF,  ef 60-65%,  mild LAE,  mild TR, trivial MR and PR   Family History  Problem Relation Age of Onset  . Hypertension Mother   . Cancer Mother 74    breast  . Dementia Mother   . Hypertension Brother   . Diabetes Brother   . Heart disease Brother     before age 31  . Cancer Father 61    pancreatic  . Heart failure Paternal Grandmother   . Bladder Cancer Maternal Grandfather    Social History:  reports that she has never smoked. She has never used smokeless tobacco. She reports that she does not drink alcohol or use drugs. Allergies  Allergen Reactions  . Vicodin [Hydrocodone-Acetaminophen] Nausea And Vomiting  . Chlorhexidine Rash    Sunburn    rash  .  Percocet [Oxycodone-Acetaminophen] Nausea And Vomiting   Prior to Admission medications   Medication Sig Start Date End Date Taking? Authorizing Provider  acetaminophen (TYLENOL) 500 MG tablet Take 1,000 mg by mouth every 6 (six) hours as needed for moderate pain or headache.    Yes Historical Provider, MD  amiodarone (PACERONE) 200 MG tablet Take 1 tablet (200 mg total) by mouth daily. 12/07/15  Yes Scott T Weaver, PA-C  bimatoprost (LUMIGAN) 0.01 % SOLN Place 1 drop into both eyes at bedtime.   Yes Historical Provider, MD  calcium acetate (PHOSLO) 667 MG capsule Take 667 mg by mouth 3 (three) times daily with meals.   Yes Historical Provider, MD  Multiple Vitamins-Minerals (MULTIVITAMIN WITH MINERALS) tablet Take 1 tablet by mouth daily.   Yes Historical Provider, MD  Nutritional Supplements (FEEDING SUPPLEMENT, NEPRO CARB STEADY,) LIQD Take 237 mLs by mouth daily. Patient taking differently: Take 237 mLs by mouth daily as needed (nutritional supplement).   05/07/15  Yes Maryann Mikhail, DO  ranitidine (ZANTAC) 150 MG tablet Take 150 mg by mouth 2 (two) times daily as needed for heartburn.   Yes Historical Provider, MD  saccharomyces boulardii (FLORASTOR) 250 MG capsule Take 1 capsule (250 mg total) by mouth 2 (two) times daily. 07/08/15  Yes Donne Hazel, MD  sodium chloride (OCEAN) 0.65 % SOLN nasal spray Place 1 spray into both nostrils 3 (three) times daily as needed for congestion.   Yes Historical Provider, MD  timolol (BETIMOL) 0.5 % ophthalmic solution Place 1 drop into both eyes every morning.    Yes Historical Provider, MD  warfarin (COUMADIN) 5 MG tablet Take as directed by Coumadin Clinic Patient taking differently: Take 2.5-5 mg by mouth daily at 6 PM. TAKES 5MG  ON MON, WED AND FRI  TAKES 2.5MG  ALL OTHER DAYS 02/16/16  Yes Jettie Booze, MD   Current Facility-Administered Medications  Medication Dose Route Frequency Provider Last Rate Last Dose  . acetaminophen (TYLENOL) tablet 650 mg  650 mg Oral Q6H PRN Radene Gunning, NP   650 mg at 05/11/16 0636   Or  . acetaminophen (TYLENOL) suppository 650 mg  650 mg Rectal Q6H PRN Radene Gunning, NP      . amiodarone (PACERONE) tablet 200 mg  200 mg Oral Daily Lezlie Octave Black, NP   200 mg at 05/11/16 1023  . calcium acetate (PHOSLO) capsule 667 mg  667 mg Oral TID WC Radene Gunning, NP   667 mg at 05/11/16 0175  . ceFEPIme (MAXIPIME) 1 g in dextrose 5 % 50 mL IVPB  1 g Intravenous Q24H Jake Church Masters, RPH   1 g at 05/10/16 2203  . Chlorhexidine Gluconate Cloth 2 % PADS 6 each  6 each Topical Q0600 Reyne Dumas, MD      . Darbepoetin Alfa (ARANESP) injection 60 mcg  60 mcg Intravenous Once in dialysis Alric Seton, PA-C      . famotidine (PEPCID) tablet 10 mg  10 mg Oral Daily Radene Gunning, NP   10 mg at 05/11/16 1022  . feeding supplement (NEPRO CARB STEADY) liquid 237 mL  237 mL Oral Daily PRN Radene Gunning, NP      . ferric gluconate (NULECIT) 62.5 mg in sodium chloride 0.9 % 100 mL  IVPB  62.5 mg Intravenous Once in dialysis Alric Seton, PA-C      . latanoprost (XALATAN) 0.005 % ophthalmic solution 1 drop  1 drop Both Eyes QHS Radene Gunning, NP  1 drop at 05/10/16 2217  . multivitamin (RENA-VIT) tablet 1 tablet  1 tablet Oral QHS Alric Seton, PA-C      . mupirocin ointment (BACTROBAN) 2 % 1 application  1 application Nasal BID Reyne Dumas, MD   1 application at 00/93/81 1022  . ondansetron (ZOFRAN) tablet 4 mg  4 mg Oral Q6H PRN Radene Gunning, NP       Or  . ondansetron [facility]) injection 4 mg  4 mg Intravenous Q6H PRN Lezlie Octave Black, NP      . saccharomyces boulardii (FLORASTOR) capsule 250 mg  250 mg Oral BID Lezlie Octave Black, NP   250 mg at 05/11/16 1022  . sodium chloride (OCEAN) 0.65 % nasal spray 1 spray  1 spray Each Nare TID PRN Radene Gunning, NP      . sodium chloride flush (NS) 0.9 % injection 3 mL  3 mL Intravenous Q12H Radene Gunning, NP   3 mL at 05/11/16 1025  . timolol (TIMOPTIC) 0.5 % ophthalmic solution 1 drop  1 drop Both Eyes q morning - 10a Elwin Mocha, MD   1 drop at 05/11/16 1021  . warfarin (COUMADIN) tablet 5 mg  5 mg Oral ONCE-1800 Reyne Dumas, MD      . Warfarin - Pharmacist Dosing Inpatient   Does not apply [place]       Labs: Basic Metabolic Panel:  Recent Labs Lab 05/10/16 1133 05/10/16 1638 05/10/16 1648 05/11/16 0457  NA 133* 131* 131* 135  K 3.7 3.5 3.6 3.6  CL 96* 94* 96* 98*  CO2 25 26  --  26  GLUCOSE 127* 107* 101* 87  BUN 27* 28* 29* 32*  CREATININE 5.86* 5.93* 6.20* 6.94*  CALCIUM 8.6* 8.3*  --  8.9  PHOS 2.6  --   --   --    Liver Function Tests:  Recent Labs Lab 05/10/16 1133 05/10/16 1638 05/11/16 0457  AST  --  22 17  ALT  --  21 19  ALKPHOS  --  81 79  BILITOT  --  0.6 0.5  PROT  --  5.6* 5.5*  ALBUMIN 2.7* 2.5* 2.4*   No results for input(s): LIPASE, AMYLASE in the last 168 hours. No results for input(s): AMMONIA in the last 168 hours. CBC:  Recent Labs Lab 05/10/16 1133  05/10/16 1648 05/11/16 0457  WBC 21.5*  --  14.9*  HGB 9.5* 8.8* 9.0*  HCT 30.2* 26.0* 28.1*  MCV 92.9  --  93.0  PLT 166  --  210   Lab Results  Component Value Date   INR 2.48 05/11/2016   INR 2.12 05/10/2016   INR 2.2 04/15/2016    Cardiac Enzymes: No results for input(s): CKTOTAL, CKMB, CKMBINDEX, TROPONINI in the last 168 hours. CBG:  Recent Labs Lab 05/10/16 1107  GLUCAP 130*   Iron Studies: No results for input(s): IRON, TIBC, TRANSFERRIN, FERRITIN in the last 72 hours. Studies/Results: Dg Chest 2 View  Result Date: 05/10/2016 CLINICAL DATA:  Shortness of breath. EXAM: CHEST  2 VIEW COMPARISON:  Radiographs of January 30, 2016. FINDINGS: Stable cardiomegaly. No pneumothorax is noted. Hypoinflation of the lungs is noted. Right lung is clear. Increased left basilar opacity is noted concerning for atelectasis or infiltrate. No significant pleural effusion is noted. Bony thorax is unremarkable. Right internal jugular dialysis catheter noted on prior exam has been removed. IMPRESSION: Mildly increased left basilar opacity is noted concerning for worsening atelectasis  or infiltrate. Electronically Signed   By: Marijo Conception, M.D.   On: 05/10/2016 12:14   US Renal  Result Date: 05/11/2016 CLINICAL DATA:  Recurrent urinary tract infections, history of bilateral ureteral stents, the patient is on dialysis EXAM: RENAL / URINARY TRACT ULTRASOUND COMPLETE COMPARISON:  Ultrasound of the kidneys of 05/23/2015 FINDINGS: Right Kidney: Length: 9.9 cm. Mild to moderate hydronephrosis is present. No echogenic foci are noted. The parenchyma of the right kidney is echogenic consistent with chronic renal medical disease. Left Kidney: Length: 9.4 cm. Moderate left hydronephrosis is noted. The parenchyma of the left kidney also is echogenic. Bladder: The urinary bladder is completely decompressed and cannot be evaluated. IMPRESSION: 1. Bilateral moderate hydronephrosis. 2. Echogenic renal  parenchyma consistent with chronic renal medical disease. 3. The urinary bladder is decompressed and cannot be evaluated. Electronically Signed   By: Ivar Drape M.D.   On: 05/11/2016 10:21   Dg Chest Port 1 View  Result Date: 05/11/2016 CLINICAL DATA:  Bibasilar crackles on physical exam EXAM: PORTABLE CHEST 1 VIEW COMPARISON:  Chest x-ray of 05/10/2016 FINDINGS: Aeration of the lungs has improved. Opacity remains particularly at the left lung base most consistent with atelectasis and possibly small left effusion. The right lung is clear. Cardiomegaly is stable. IMPRESSION: 1. Improved aeration. 2. Persistent opacity at the left lung base most consistent with atelectasis and possible small effusion Electronically Signed   By: Ivar Drape M.D.   On: 05/11/2016 08:06    ROS: As per HPI otherwise negative.  Physical Exam: Vitals:   05/11/16 0051 05/11/16 0053 05/11/16 0108 05/11/16 0553  BP: (!) 103/39 (!) 84/44 (!) 118/56 (!) 110/93  Pulse: 83   85  Resp:    20  Temp: 97.9 F (36.6 C)   98.5 F (36.9 C)  TempSrc: Oral   Oral  SpO2: 96%   100%  Weight:    83.7 kg (184 lb 9.6 oz)  Height:         General: WDWN NAD, pale Head: NCAT sclera not icteric MMM Neck: Supple.  Lungs: CTA bilaterally without wheezes, rales, or rhonchi. Breathing is unlabored. Heart: RRR with S1 S2.  Abdomen: soft NT + BS Lower extremities:without edema or open wounds, dystorphic nails  Neuro: A & O  X 3. Moves all extremities spontaneously. Psych:  Responds to questions appropriately with a normal affect. Dialysis Access: left upper AVF - infiltrated last week- no problems since then  Dialysis Orders: [facility] TTS 4 hr 180 400/800 EDW 83.5 left upper AVF heparin 2200 2 K 2 Ca venofer 50/week - last Mircera was 75 on 9/4- hgb has been trending down - last was 10.7 1116 38% sat iPTH 133- no VDRA  Assessment/Plan: 1. Sepsis -- BC pending - urine culture no growth on culture; access looks ok, no GI sx- WBC improving  on current abtx; afebrile this am 2. ESRD -  TTS - last HD Monday on holiday schedule - plan HD today K 3.6 use 4 K bath- then back on schedule Saturday 3. Hypertension/volume  - no BP meds - leaving below usually at 83 kg - re-eval EDW while here 4. Anemia  - hgb trending down - resume ESA - continue weekly Fe 5. Metabolic bone disease -  No VDRA/ only on phoslo 6. Nutrition - renal diet + vits 7. Afib - needs updated med list at dialysis unit - amio was not on this list - only on coumadin -which is managed by the  coumadin clinic  Myriam Jacobson, PA-C [facility] Beeper [phone] 05/11/2016, 11:22 AM  I have seen and examined this patient and agree with plan per Amalia Hailey.  70yo WF admitted after a fall at home but found to be febrile with high WBC.  Has hx of UTI's and though UA showed TNTC WBC, the culture is neg.  BC neg to date.  Placed on empiric AB and feeling much better today.  CXR ? LLL opacity.  Plan HD today. [name] T,MD 05/11/2016 12:14 PM

## 2016-05-12 LAB — BLOOD CULTURE ID PANEL (REFLEXED)

## 2016-05-12 LAB — PROTIME-INR
INR: 2.53
Prothrombin Time: 27.7 seconds — ABNORMAL HIGH (ref 11.4–15.2)

## 2016-05-12 MED ORDER — AMOXICILLIN-POT CLAVULANATE 500-125 MG PO TABS: 500.0000 mg | ORAL_TABLET | Freq: Every day | ORAL | 0 refills | Status: AC

## 2016-05-12 MED ORDER — AMOXICILLIN-POT CLAVULANATE 500-125 MG PO TABS
500.0000 mg | ORAL_TABLET | Freq: Every day | ORAL | Status: DC
  Administered 2016-05-12: 500 mg via ORAL
  Filled 2016-05-12: qty 1

## 2016-05-12 MED ORDER — AMOXICILLIN-POT CLAVULANATE 500-125 MG PO TABS: 1.0000 | ORAL_TABLET | Freq: Two times a day (BID) | ORAL | 0 refills | Status: DC

## 2016-05-12 NOTE — Progress Notes (Signed)
Pharmacy Antibiotic Note  [name] is a 70 y.o. female admitted on 05/10/2016 with pneumonia.  Pharmacy has been consulted for Augmentin dosing. Pt is ESRD on HD.   Plan: Augmentin 500 mg PO q24h  F/u LOT  Height: 5\' 3"  (160 cm) Weight: 181 lb 14.4 oz (82.5 kg) (Scale B) IBW/kg (Calculated) : 52.4  Temp (24hrs), Avg:98.7 F (37.1 C), Min:97.7 F (36.5 C), Max:100.4 F (38 C)   Recent Labs Lab 05/10/16 1133 05/10/16 1142 05/10/16 1638 05/10/16 1648 05/10/16 1856 05/10/16 2138 05/11/16 0457  WBC 21.5*  --   --   --   --   --  14.9*  CREATININE 5.86*  --  5.93* 6.20*  --   --  6.94*  LATICACIDVEN  --  1.01  --   --  1.8 1.4  --     Estimated Creatinine Clearance: 7.6 mL/min (by C-G formula based on SCr of 6.94 mg/dL (H)).    Allergies  Allergen Reactions  . Vicodin [Hydrocodone-Acetaminophen] Nausea And Vomiting  . Chlorhexidine Rash    Sunburn    rash  . Percocet [Oxycodone-Acetaminophen] Nausea And Vomiting    Antimicrobials this admission: 11/21 cefriaxone x1 11/21 vancomycin (1750 load given 11/21) > 11/21 cefepime > 1/23 Augmentin >>  Microbiology results: 11/21 BCx: NGTD 11/21 UCx: negative  11/22 MRSA PCR: positive  Thank you for allowing pharmacy to be a part of this patient's care.  Nekeshia Lenhardt L. Alper Guilmette, PharmD Infectious Diseases Clinical Pharmacist Pager: [phone] 05/12/2016 9:12 AM

## 2016-05-12 NOTE — Progress Notes (Signed)
Orders received for pt discharge.  Discharge summary printed and reviewed with pt.  Explained medication regimen, and pt had no further questions at this time.  IV removed and site remains clean, dry, intact.  Telemetry removed.  Pt in stable condition and awaiting transport. 

## 2016-05-12 NOTE — Progress Notes (Signed)
S: No new CO.  Feels well O:BP (!) 122/52 (BP Location: Right Arm)   Pulse 87   Temp 97.7 F (36.5 C) (Oral)   Resp 18   Ht 5\' 3"  (1.6 m)   Wt 82.5 kg (181 lb 14.4 oz) Comment: Scale B  LMP 01/19/1992 (Approximate)   SpO2 95%   BMI 32.22 kg/m   Intake/Output Summary (Last 24 hours) at 05/12/16 6269 Last data filed at 05/12/16 0600  Gross per 24 hour  Intake              653 ml  Output             1055 ml  Net             -402 ml   Weight change: -3.344 kg (-7 lb 6 oz) SWN:IOEVO and alert CVS: RRR Resp:clear Abd:+ BS NTND Ext: No edema LUA AVF + bruit NEURO: CNI Ox3 no asterixis   . amiodarone  200 mg Oral Daily  . calcium acetate  667 mg Oral TID WC  . ceFEPime (MAXIPIME) IV  1 g Intravenous Q24H  . Chlorhexidine Gluconate Cloth  6 each Topical Q0600  . famotidine  10 mg Oral Daily  . latanoprost  1 drop Both Eyes QHS  . multivitamin  1 tablet Oral QHS  . mupirocin ointment  1 application Nasal BID  . saccharomyces boulardii  250 mg Oral BID  . sodium chloride flush  3 mL Intravenous Q12H  . timolol  1 drop Both Eyes q morning - 10a  . Warfarin - Pharmacist Dosing Inpatient   Does not apply q1800   Dg Chest 2 View  Result Date: 05/10/2016 CLINICAL DATA:  Shortness of breath. EXAM: CHEST  2 VIEW COMPARISON:  Radiographs of January 30, 2016. FINDINGS: Stable cardiomegaly. No pneumothorax is noted. Hypoinflation of the lungs is noted. Right lung is clear. Increased left basilar opacity is noted concerning for atelectasis or infiltrate. No significant pleural effusion is noted. Bony thorax is unremarkable. Right internal jugular dialysis catheter noted on prior exam has been removed. IMPRESSION: Mildly increased left basilar opacity is noted concerning for worsening atelectasis or infiltrate. Electronically Signed   By: Marijo Conception, M.D.   On: 05/10/2016 12:14   US Renal  Result Date: 05/11/2016 CLINICAL DATA:  Recurrent urinary tract infections, history of bilateral  ureteral stents, the patient is on dialysis EXAM: RENAL / URINARY TRACT ULTRASOUND COMPLETE COMPARISON:  Ultrasound of the kidneys of 05/23/2015 FINDINGS: Right Kidney: Length: 9.9 cm. Mild to moderate hydronephrosis is present. No echogenic foci are noted. The parenchyma of the right kidney is echogenic consistent with chronic renal medical disease. Left Kidney: Length: 9.4 cm. Moderate left hydronephrosis is noted. The parenchyma of the left kidney also is echogenic. Bladder: The urinary bladder is completely decompressed and cannot be evaluated. IMPRESSION: 1. Bilateral moderate hydronephrosis. 2. Echogenic renal parenchyma consistent with chronic renal medical disease. 3. The urinary bladder is decompressed and cannot be evaluated. Electronically Signed   By: Ivar Drape M.D.   On: 05/11/2016 10:21   Dg Chest Port 1 View  Result Date: 05/11/2016 CLINICAL DATA:  Bibasilar crackles on physical exam EXAM: PORTABLE CHEST 1 VIEW COMPARISON:  Chest x-ray of 05/10/2016 FINDINGS: Aeration of the lungs has improved. Opacity remains particularly at the left lung base most consistent with atelectasis and possibly small left effusion. The right lung is clear. Cardiomegaly is stable. IMPRESSION: 1. Improved aeration. 2. Persistent opacity at the left  lung base most consistent with atelectasis and possible small effusion Electronically Signed   By: Ivar Drape M.D.   On: 05/11/2016 08:06   BMET    Component Value Date/Time   NA 135 05/11/2016 0457   K 3.6 05/11/2016 0457   CL 98 (L) 05/11/2016 0457   CO2 26 05/11/2016 0457   GLUCOSE 87 05/11/2016 0457   BUN 32 (H) 05/11/2016 0457   CREATININE 6.94 (H) 05/11/2016 0457   CALCIUM 8.9 05/11/2016 0457   CALCIUM 8.8 11/07/2015 1107   GFRNONAA 5 (L) 05/11/2016 0457   GFRAA 6 (L) 05/11/2016 0457   CBC    Component Value Date/Time   WBC 14.9 (H) 05/11/2016 0457   RBC 3.02 (L) 05/11/2016 0457   HGB 9.0 (L) 05/11/2016 0457   HGB 13.6 09/16/2013 1129   HCT  28.1 (L) 05/11/2016 0457   HCT 23.6 (L) 04/08/2015 0545   PLT 210 05/11/2016 0457   MCV 93.0 05/11/2016 0457   MCH 29.8 05/11/2016 0457   MCHC 32.0 05/11/2016 0457   RDW 15.6 (H) 05/11/2016 0457   LYMPHSABS 0.6 (L) 02/01/2016 0345   MONOABS 0.3 02/01/2016 0345   EOSABS 0.0 02/01/2016 0345   BASOSABS 0.0 02/01/2016 0345     Assessment: 1. Sepsis syndrome.  Cultures neg to date.  Low grade temp yest 2.  Anemia 3. Hx A fib 4. ESRD 5. HTN Plan: 1.  Cont Ab for now and if remains afebrile then maybe switch to PO in AM.  Grew proteus in urine back in august that was fairly sens so ? Change to augmentin PO 2. Plan HD Sat if still here 3. Recheck labs in AM   Shirline Kendle T

## 2016-05-12 NOTE — Discharge Summary (Signed)
Physician Discharge Summary  [name] MRN: [id number] DOB/AGE: 1945/07/24 70 y.o.  PCP: [name], MD   Admit date: 05/10/2016 Discharge date: 05/12/2016  Discharge Diagnoses:    Principal Problem:   Sepsis [facility]) Active Problems:   Essential hypertension, benign   Severe protein-calorie malnutrition ([place])   Anemia of renal disease   Urinary tract infectious disease   PAF (paroxysmal atrial fibrillation) ([place])   HCAP (healthcare-associated pneumonia)   PNA (pneumonia)   Dehydration    Follow-up recommendations Follow-up with PCP in 3-5 days , including all  additional recommended appointments as below Follow-up CBC, CMP in 3-5 days Follow-up PT/INR on 11/27 PCP requested an early follow-up on the results of blood cultures/urine culture that were drawn on 11/21     Current Discharge Medication List    START taking these medications   Details  amoxicillin-clavulanate (AUGMENTIN) 500-125 MG tablet Take 1 tablet (500 mg total) by mouth daily. Qty: 10 tablet, Refills: 0      CONTINUE these medications which have NOT CHANGED   Details  acetaminophen (TYLENOL) 500 MG tablet Take 1,000 mg by mouth every 6 (six) hours as needed for moderate pain or headache.     amiodarone (PACERONE) 200 MG tablet Take 1 tablet (200 mg total) by mouth daily. Qty: 30 tablet, Refills: 11   Associated Diagnoses: PAF (paroxysmal atrial fibrillation) (HCC)    bimatoprost (LUMIGAN) 0.01 % SOLN Place 1 drop into both eyes at bedtime.    calcium acetate (PHOSLO) 667 MG capsule Take 667 mg by mouth 3 (three) times daily with meals.    Multiple Vitamins-Minerals (MULTIVITAMIN WITH MINERALS) tablet Take 1 tablet by mouth daily.    Nutritional Supplements (FEEDING SUPPLEMENT, NEPRO CARB STEADY,) LIQD Take 237 mLs by mouth daily. Qty: 30 Can, Refills: 0    ranitidine (ZANTAC) 150 MG tablet Take 150 mg by mouth 2 (two) times daily as needed for heartburn.    saccharomyces  boulardii (FLORASTOR) 250 MG capsule Take 1 capsule (250 mg total) by mouth 2 (two) times daily. Qty: 60 capsule, Refills: 0    sodium chloride (OCEAN) 0.65 % SOLN nasal spray Place 1 spray into both nostrils 3 (three) times daily as needed for congestion.    timolol (BETIMOL) 0.5 % ophthalmic solution Place 1 drop into both eyes every morning.     warfarin (COUMADIN) 5 MG tablet Take as directed by [facility]madin Clinic Qty: 30 tablet, Refills: 3         Discharge Condition:Stable   Discharge Instructions Get Medicines reviewed and adjusted: Please take all your medications with you for your next visit with your Primary MD  Please request your Primary MD to go over all hospital tests and procedure/radiological results at the follow up, please ask your Primary MD to get all Hospital records sent to his/her office.  If you experience worsening of your admission symptoms, develop shortness of breath, life threatening emergency, suicidal or homicidal thoughts you must seek medical attention immediately by calling 911 or calling your MD immediately if symptoms less severe.  You must read complete instructions/literature along with all the possible adverse reactions/side effects for all the Medicines you take and that have been prescribed to you. Take any new Medicines after you have completely understood and accpet all the possible adverse reactions/side effects.   Do not drive when taking Pain medications.   Do not take more than prescribed Pain, Sleep and Anxiety Medications  Special Instructions: If you have smoked or chewed Tobacco  in the last 2 yrs please stop smoking, stop any regular Alcohol and or any Recreational drug use.  Wear Seat belts while driving.  Please note  You were cared for by a hospitalist during your hospital stay. Once you are discharged, your primary care physician will handle any further medical issues. Please note that NO REFILLS for any discharge medications  will be authorized once you are discharged, as it is imperative that you return to your primary care physician (or establish a relationship with a primary care physician if you do not have one) for your aftercare needs so that they can reassess your need for medications and monitor your lab values.     Allergies  Allergen Reactions  . Vicodin [Hydrocodone-Acetaminophen] Nausea And Vomiting  . Chlorhexidine Rash    Sunburn    rash  . Percocet [Oxycodone-Acetaminophen] Nausea And Vomiting      Disposition: 01-Home or Self Care   Consults: Nephrology     Significant Diagnostic Studies:  Dg Chest 2 View  Result Date: 05/10/2016 CLINICAL DATA:  Shortness of breath. EXAM: CHEST  2 VIEW COMPARISON:  Radiographs of January 30, 2016. FINDINGS: Stable cardiomegaly. No pneumothorax is noted. Hypoinflation of the lungs is noted. Right lung is clear. Increased left basilar opacity is noted concerning for atelectasis or infiltrate. No significant pleural effusion is noted. Bony thorax is unremarkable. Right internal jugular dialysis catheter noted on prior exam has been removed. IMPRESSION: Mildly increased left basilar opacity is noted concerning for worsening atelectasis or infiltrate. Electronically Signed   By: Marijo Conception, M.D.   On: 05/10/2016 12:14   US Renal  Result Date: 05/11/2016 CLINICAL DATA:  Recurrent urinary tract infections, history of bilateral ureteral stents, the patient is on dialysis EXAM: RENAL / URINARY TRACT ULTRASOUND COMPLETE COMPARISON:  Ultrasound of the kidneys of 05/23/2015 FINDINGS: Right Kidney: Length: 9.9 cm. Mild to moderate hydronephrosis is present. No echogenic foci are noted. The parenchyma of the right kidney is echogenic consistent with chronic renal medical disease. Left Kidney: Length: 9.4 cm. Moderate left hydronephrosis is noted. The parenchyma of the left kidney also is echogenic. Bladder: The urinary bladder is completely decompressed and cannot  be evaluated. IMPRESSION: 1. Bilateral moderate hydronephrosis. 2. Echogenic renal parenchyma consistent with chronic renal medical disease. 3. The urinary bladder is decompressed and cannot be evaluated. Electronically Signed   By: Ivar Drape M.D.   On: 05/11/2016 10:21   Dg Chest Port 1 View  Result Date: 05/11/2016 CLINICAL DATA:  Bibasilar crackles on physical exam EXAM: PORTABLE CHEST 1 VIEW COMPARISON:  Chest x-ray of 05/10/2016 FINDINGS: Aeration of the lungs has improved. Opacity remains particularly at the left lung base most consistent with atelectasis and possibly small left effusion. The right lung is clear. Cardiomegaly is stable. IMPRESSION: 1. Improved aeration. 2. Persistent opacity at the left lung base most consistent with atelectasis and possible small effusion Electronically Signed   By: Ivar Drape M.D.   On: 05/11/2016 08:06        Filed Weights   05/11/16 1405 05/11/16 1814 05/12/16 0504  Weight: 84.2 kg (185 lb 10 oz) 82.5 kg (181 lb 14.1 oz) 82.5 kg (181 lb 14.4 oz)     Microbiology: Recent Results (from the past 240 hour(s))  Culture, blood (Routine x 2)     Status: None (Preliminary result)   Collection Time: 05/10/16 11:32 AM  Result Value Ref Range Status   Specimen Description BLOOD RIGHT ANTECUBITAL  Final  Special Requests BOTTLES DRAWN AEROBIC ONLY 5CC  Final   Culture NO GROWTH 1 DAY  Final   Report Status PENDING  Incomplete  Culture, blood (Routine x 2)     Status: None (Preliminary result)   Collection Time: 05/10/16 11:36 AM  Result Value Ref Range Status   Specimen Description BLOOD RIGHT HAND  Final   Special Requests BOTTLES DRAWN AEROBIC AND ANAEROBIC 5CC  Final   Culture NO GROWTH 1 DAY  Final   Report Status PENDING  Incomplete  Urine culture     Status: None   Collection Time: 05/10/16 12:53 PM  Result Value Ref Range Status   Specimen Description URINE, RANDOM  Final   Special Requests NONE  Final   Culture NO GROWTH  Final    Report Status 05/11/2016 FINAL  Final  MRSA PCR Screening     Status: Abnormal   Collection Time: 05/11/16  3:01 AM  Result Value Ref Range Status   MRSA by PCR POSITIVE (A) NEGATIVE Final    Comment:        The GeneXpert MRSA Assay (FDA approved for NASAL specimens only), is one component of a comprehensive MRSA colonization surveillance program. It is not intended to diagnose MRSA infection nor to guide or monitor treatment for MRSA infections. RESULT CALLED TO, READ BACK BY AND VERIFIED WITH: S THOMAS,RN '@[id number]'$  05/11/16 MKELLY,MLT        Blood Culture    Component Value Date/Time   SDES URINE, RANDOM 05/10/2016 1253   SPECREQUEST NONE 05/10/2016 1253   CULT NO GROWTH 05/10/2016 1253   REPTSTATUS 05/11/2016 FINAL 05/10/2016 1253      Labs: Results for orders placed or performed during the hospital encounter of 05/10/16 (from the past 48 hour(s))  CBG monitoring, ED     Status: Abnormal   Collection Time: 05/10/16 11:07 AM  Result Value Ref Range   Glucose-Capillary 130 (H) 65 - 99 mg/dL   Comment 1 Notify RN    Comment 2 Document in Chart   Culture, blood (Routine x 2)     Status: None (Preliminary result)   Collection Time: 05/10/16 11:32 AM  Result Value Ref Range   Specimen Description BLOOD RIGHT ANTECUBITAL    Special Requests BOTTLES DRAWN AEROBIC ONLY 5CC    Culture NO GROWTH 1 DAY    Report Status PENDING   CBC     Status: Abnormal   Collection Time: 05/10/16 11:33 AM  Result Value Ref Range   WBC 21.5 (H) 4.0 - 10.5 K/uL   RBC 3.25 (L) 3.87 - 5.11 MIL/uL   Hemoglobin 9.5 (L) 12.0 - 15.0 g/dL   HCT 30.2 (L) 36.0 - 46.0 %   MCV 92.9 78.0 - 100.0 fL   MCH 29.2 26.0 - 34.0 pg   MCHC 31.5 30.0 - 36.0 g/dL   RDW 15.2 11.5 - 15.5 %   Platelets 166 150 - 400 K/uL  Protime-INR     Status: Abnormal   Collection Time: 05/10/16 11:33 AM  Result Value Ref Range   Prothrombin Time 24.1 (H) 11.4 - 15.2 seconds   INR 2.12   Renal function panel     Status:  Abnormal   Collection Time: 05/10/16 11:33 AM  Result Value Ref Range   Sodium 133 (L) 135 - 145 mmol/L   Potassium 3.7 3.5 - 5.1 mmol/L   Chloride 96 (L) 101 - 111 mmol/L   CO2 25 22 - 32 mmol/L   Glucose, Bld 127 (H)  65 - 99 mg/dL   BUN 27 (H) 6 - 20 mg/dL   Creatinine, Ser 5.86 (H) 0.44 - 1.00 mg/dL   Calcium 8.6 (L) 8.9 - 10.3 mg/dL   Phosphorus 2.6 2.5 - 4.6 mg/dL   Albumin 2.7 (L) 3.5 - 5.0 g/dL   GFR calc non Af Amer 7 (L) >60 mL/min   GFR calc Af Amer 8 (L) >60 mL/min    Comment: (NOTE) The eGFR has been calculated using the CKD EPI equation. This calculation has not been validated in all clinical situations. eGFR's persistently <60 mL/min signify possible Chronic Kidney Disease.    Anion gap 12 5 - 15  Culture, blood (Routine x 2)     Status: None (Preliminary result)   Collection Time: 05/10/16 11:36 AM  Result Value Ref Range   Specimen Description BLOOD RIGHT HAND    Special Requests BOTTLES DRAWN AEROBIC AND ANAEROBIC 5CC    Culture NO GROWTH 1 DAY    Report Status PENDING   I-Stat CG4 Lactic Acid, ED     Status: None   Collection Time: 05/10/16 11:42 AM  Result Value Ref Range   Lactic Acid, Venous 1.01 0.5 - 1.9 mmol/L  Urinalysis, Routine w reflex microscopic     Status: Abnormal   Collection Time: 05/10/16 12:53 PM  Result Value Ref Range   Color, Urine RED (A) YELLOW    Comment: BIOCHEMICALS MAY BE AFFECTED BY COLOR   APPearance TURBID (A) CLEAR   Specific Gravity, Urine 1.010 1.005 - 1.030   pH 7.0 5.0 - 8.0   Glucose, UA NEGATIVE NEGATIVE mg/dL   Hgb urine dipstick LARGE (A) NEGATIVE   Bilirubin Urine NEGATIVE NEGATIVE   Ketones, ur NEGATIVE NEGATIVE mg/dL   Protein, ur >300 (A) NEGATIVE mg/dL   Nitrite NEGATIVE NEGATIVE   Leukocytes, UA LARGE (A) NEGATIVE  Urine culture     Status: None   Collection Time: 05/10/16 12:53 PM  Result Value Ref Range   Specimen Description URINE, RANDOM    Special Requests NONE    Culture NO GROWTH    Report  Status 05/11/2016 FINAL   Urine microscopic-add on     Status: Abnormal   Collection Time: 05/10/16 12:53 PM  Result Value Ref Range   Squamous Epithelial / LPF NONE SEEN NONE SEEN   WBC, UA TOO NUMEROUS TO COUNT 0 - 5 WBC/hpf   RBC / HPF 0-5 0 - 5 RBC/hpf   Bacteria, UA MANY (A) NONE SEEN  Strep pneumoniae urinary antigen     Status: None   Collection Time: 05/10/16  3:41 PM  Result Value Ref Range   Strep Pneumo Urinary Antigen NEGATIVE NEGATIVE    Comment:        Infection due to S. pneumoniae cannot be absolutely ruled out since the antigen present may be below the detection limit of the test.   Comprehensive metabolic panel     Status: Abnormal   Collection Time: 05/10/16  4:38 PM  Result Value Ref Range   Sodium 131 (L) 135 - 145 mmol/L   Potassium 3.5 3.5 - 5.1 mmol/L   Chloride 94 (L) 101 - 111 mmol/L   CO2 26 22 - 32 mmol/L   Glucose, Bld 107 (H) 65 - 99 mg/dL   BUN 28 (H) 6 - 20 mg/dL   Creatinine, Ser 5.93 (H) 0.44 - 1.00 mg/dL   Calcium 8.3 (L) 8.9 - 10.3 mg/dL   Total Protein 5.6 (L) 6.5 - 8.1 g/dL  Albumin 2.5 (L) 3.5 - 5.0 g/dL   AST 22 15 - 41 U/L   ALT 21 14 - 54 U/L   Alkaline Phosphatase 81 38 - 126 U/L   Total Bilirubin 0.6 0.3 - 1.2 mg/dL   GFR calc non Af Amer 6 (L) >60 mL/min   GFR calc Af Amer 7 (L) >60 mL/min    Comment: (NOTE) The eGFR has been calculated using the CKD EPI equation. This calculation has not been validated in all clinical situations. eGFR's persistently <60 mL/min signify possible Chronic Kidney Disease.    Anion gap 11 5 - 15  HIV antibody     Status: None   Collection Time: 05/10/16  4:38 PM  Result Value Ref Range   HIV Screen 4th Generation wRfx Non Reactive Non Reactive    Comment: (NOTE) Performed At: [facility] [place], [place] [id number] Lindon Romp MD PZ:[id number]   I-Stat Chem 8, ED     Status: Abnormal   Collection Time: 05/10/16  4:48 PM  Result Value Ref Range   Sodium 131  (L) 135 - 145 mmol/L   Potassium 3.6 3.5 - 5.1 mmol/L   Chloride 96 (L) 101 - 111 mmol/L   BUN 29 (H) 6 - 20 mg/dL   Creatinine, Ser 6.20 (H) 0.44 - 1.00 mg/dL   Glucose, Bld 101 (H) 65 - 99 mg/dL   Calcium, Ion 1.01 (L) 1.15 - 1.40 mmol/L   TCO2 26 0 - 100 mmol/L   Hemoglobin 8.8 (L) 12.0 - 15.0 g/dL   HCT 26.0 (L) 36.0 - 46.0 %  Lactic acid, plasma     Status: None   Collection Time: 05/10/16  6:56 PM  Result Value Ref Range   Lactic Acid, Venous 1.8 0.5 - 1.9 mmol/L  Lactic acid, plasma     Status: None   Collection Time: 05/10/16  9:38 PM  Result Value Ref Range   Lactic Acid, Venous 1.4 0.5 - 1.9 mmol/L  MRSA PCR Screening     Status: Abnormal   Collection Time: 05/11/16  3:01 AM  Result Value Ref Range   MRSA by PCR POSITIVE (A) NEGATIVE    Comment:        The GeneXpert MRSA Assay (FDA approved for NASAL specimens only), is one component of a comprehensive MRSA colonization surveillance program. It is not intended to diagnose MRSA infection nor to guide or monitor treatment for MRSA infections. RESULT CALLED TO, READ BACK BY AND VERIFIED WITH: S THOMAS,RN '@[id number]'$  05/11/16 MKELLY,MLT   Comprehensive metabolic panel     Status: Abnormal   Collection Time: 05/11/16  4:57 AM  Result Value Ref Range   Sodium 135 135 - 145 mmol/L   Potassium 3.6 3.5 - 5.1 mmol/L   Chloride 98 (L) 101 - 111 mmol/L   CO2 26 22 - 32 mmol/L   Glucose, Bld 87 65 - 99 mg/dL   BUN 32 (H) 6 - 20 mg/dL   Creatinine, Ser 6.94 (H) 0.44 - 1.00 mg/dL   Calcium 8.9 8.9 - 10.3 mg/dL   Total Protein 5.5 (L) 6.5 - 8.1 g/dL   Albumin 2.4 (L) 3.5 - 5.0 g/dL   AST 17 15 - 41 U/L   ALT 19 14 - 54 U/L   Alkaline Phosphatase 79 38 - 126 U/L   Total Bilirubin 0.5 0.3 - 1.2 mg/dL   GFR calc non Af Amer 5 (L) >60 mL/min   GFR calc Af Amer 6 (L) >  60 mL/min    Comment: (NOTE) The eGFR has been calculated using the CKD EPI equation. This calculation has not been validated in all clinical situations. eGFR's  persistently <60 mL/min signify possible Chronic Kidney Disease.    Anion gap 11 5 - 15  CBC     Status: Abnormal   Collection Time: 05/11/16  4:57 AM  Result Value Ref Range   WBC 14.9 (H) 4.0 - 10.5 K/uL   RBC 3.02 (L) 3.87 - 5.11 MIL/uL   Hemoglobin 9.0 (L) 12.0 - 15.0 g/dL   HCT 28.1 (L) 36.0 - 46.0 %   MCV 93.0 78.0 - 100.0 fL   MCH 29.8 26.0 - 34.0 pg   MCHC 32.0 30.0 - 36.0 g/dL   RDW 15.6 (H) 11.5 - 15.5 %   Platelets 210 150 - 400 K/uL  Protime-INR     Status: Abnormal   Collection Time: 05/11/16  4:57 AM  Result Value Ref Range   Prothrombin Time 27.3 (H) 11.4 - 15.2 seconds   INR 2.48   Hepatitis B surface antigen     Status: None   Collection Time: 05/11/16  2:19 PM  Result Value Ref Range   Hepatitis B Surface Ag Negative Negative    Comment: (NOTE) Stat results given to Myrna Blazer 05/11/2016 at 21:38 Performed At: [facility] [place], [place] [id number] Lindon Romp MD NU:[id number]   Protime-INR     Status: Abnormal   Collection Time: 05/12/16  4:35 AM  Result Value Ref Range   Prothrombin Time 27.7 (H) 11.4 - 15.2 seconds   INR 2.53      Lipid Panel     Component Value Date/Time   CHOL 146 11/20/2015 1302   TRIG 84 07/04/2015 1014   HDL 43 07/04/2015 1014   CHOLHDL 2.9 07/04/2015 1014   VLDL 17 07/04/2015 1014   LDLCALC 63 07/04/2015 1014     Lab Results  Component Value Date   HGBA1C 5.6 11/13/2014        Amy Gamma Murrayis a very pleasant 70 y.o.femalewith medical history significant for end-stage renal disease Tuesday Thursday Saturday client,  , CM, EF 20-25%, afib, prior hx recurrent UTIx right hydro  with ureteral stent/nephrostomy - all removed, presents to the emergency Department chief complaint of generalized weakness , fever, and falls. Initial evaluation in the emergency department reveals a urinary tract infection a chest x-ray concerning for pneumonia Upon presentation yesterday, she had a temp  to 100.2 then later 102. BP 111/48, WBC up to 21.5 now down to 14.9, hgb 9, CXR ? Opacity LLL.    Assessment and plan  #1. Sepsis. Likely related to urinary tract infection and/or left lower lobe pneumonia per x-ray. WBCs 21.5>14.9 improving, patient presented febrile with a temperature of 102.2 tachycardia with tachypnea. Lactic acid within the limits of normal Telemetry uneventful Blood culture urine culture still pending Antibiotics narrowed based on urine culture from 8/14 that showed Proteus mirabilis PCP requested to continue to follow results of culture Patient resuscitated with IV fluids in the ER -Initially placed on broad-spectrum Antibiotics for healthcare associated pneumonia, namely cefepime and vancomycin -Strep pneumo urine antigen negative Now switched to Augmentin 10 days  #2. Healthcare associated pneumonia -See #2, afebrile overnight, switched antibiotics to Augmentin   #3. Urinary tract infection disease. With history of frequent UTIs. She states she had had a urinary tract infection 4 months. History of nephrostomy tubes/ureteral stents-all removed by Dr. Gaynelle Arabian -Follow urine culture -  Antibiotics as noted above Renal ultrasound continues to show bilateral moderate hydronephrosis and then patient would benefit from follow-up with Dr. Gaynelle Arabian in the outpatient setting   #4. End-stage renal disease. She is a Tuesday Thursday dialysis patient. She still makes urine. She states her dialysis schedule is different this week due to the holiday. She states she was unable to complete recent session and has had extra sessions due to upcoming holiday which has left her feel weak.  -Nephrology contacted again to continue hemodialysis on schedule,   discussed with Dr. Mercy Moore   #5. Hypertension. Patient was hypotensive on it admission as noted above. Be related to extra dialysis over the last couple of days. Home medications include metoprolol -We'll hold this for  now -Monitor closely -Resume as indicated  6. PAF. chadvasc score 4. On Coumadin. INR therapeutic prior to discharge. EKG as noted above. Home meds also include amiodarone INR check on 11/27 with PCP -continue amiodarone  #7. Anemia of chronic disease. Myoglobin 9.5>9.0. Chart review indicates this is close to baseline. No signs symptoms of active bleeding    #8 dilated cardiomyopathy with EF of 20-25%, now improved     Discharge Exam: *  Blood pressure (!) 122/52, pulse 87, temperature 97.7 F (36.5 C), temperature source Oral, resp. rate 18, height '5\' 3"'$  (1.6 m), weight 82.5 kg (181 lb 14.4 oz), last menstrual period 01/19/1992, SpO2 95 %.  General exam: Appears calm and comfortable  Respiratory system: Clear to auscultation. Respiratory effort normal. Cardiovascular system: S1 & S2 heard, RRR. No JVD, murmurs, rubs, gallops or clicks. No pedal edema. Gastrointestinal system: Abdomen is nondistended, soft and nontender. No organomegaly or masses felt. Normal bowel sounds heard. Central nervous system: Alert and oriented. No focal neurological deficits. Extremities: Symmetric 5 x 5 power. Skin: No rashes, lesions or ulcers Psychiatry: Judgement and insight appear normal. Mood & affect appropriate.     Follow-up Temple, MD. Schedule an appointment as soon as possible for a visit in 2 day(s).   Specialty:  Internal Medicine Why:  Hospital follow-up, INR check 11/27 Contact information: [address] [place] [address] [phone]        SIGMUND I TANNENBAUM, MD. Call in 1 week(s).   Specialty:  Urology Why:  Call urology to make appointment for bilateral hydronephrosis Contact information: [place] [address] [phone]           Signed: Reyne Dumas 05/12/2016, 9:10 AM        Time spent >45 mins

## 2016-05-12 NOTE — Progress Notes (Signed)
PHARMACY - PHYSICIAN COMMUNICATION CRITICAL VALUE ALERT - BLOOD CULTURE IDENTIFICATION (BCID)  Results for orders placed or performed during the hospital encounter of 05/10/16  Blood Culture ID Panel (Reflexed) (Collected: 05/10/2016 11:36 AM)  Result Value Ref Range   Enterococcus species NOT DETECTED NOT DETECTED   Listeria monocytogenes NOT DETECTED NOT DETECTED   Staphylococcus species NOT DETECTED NOT DETECTED   Staphylococcus aureus NOT DETECTED NOT DETECTED   Streptococcus species NOT DETECTED NOT DETECTED   Streptococcus agalactiae NOT DETECTED NOT DETECTED   Streptococcus pneumoniae NOT DETECTED NOT DETECTED   Streptococcus pyogenes NOT DETECTED NOT DETECTED   Acinetobacter baumannii NOT DETECTED NOT DETECTED   Enterobacteriaceae species NOT DETECTED NOT DETECTED   Enterobacter cloacae complex NOT DETECTED NOT DETECTED   Escherichia coli NOT DETECTED NOT DETECTED   Klebsiella oxytoca NOT DETECTED NOT DETECTED   Klebsiella pneumoniae NOT DETECTED NOT DETECTED   Proteus species NOT DETECTED NOT DETECTED   Serratia marcescens NOT DETECTED NOT DETECTED   Haemophilus influenzae NOT DETECTED NOT DETECTED   Neisseria meningitidis NOT DETECTED NOT DETECTED   Pseudomonas aeruginosa NOT DETECTED NOT DETECTED   Candida albicans NOT DETECTED NOT DETECTED   Candida glabrata NOT DETECTED NOT DETECTED   Candida krusei NOT DETECTED NOT DETECTED   Candida parapsilosis NOT DETECTED NOT DETECTED   Candida tropicalis NOT DETECTED NOT DETECTED    Name of physician (or Provider) Contacted: Dr. Allyson Sabal  Changes to prescribed antibiotics required: No changes recommended, continue augmentin for now  Georgina Peer 05/12/2016  12:49 PM

## 2016-05-14 LAB — CULTURE, BLOOD (ROUTINE X 2)

## 2016-05-15 LAB — CULTURE, BLOOD (ROUTINE X 2): Culture: NO GROWTH

## 2016-05-18 ENCOUNTER — Ambulatory Visit (INDEPENDENT_AMBULATORY_CARE_PROVIDER_SITE_OTHER): Payer: Medicare Other | Admitting: Cardiology

## 2016-05-18 DIAGNOSIS — Z79899 Other long term (current) drug therapy: Secondary | ICD-10-CM | POA: Diagnosis not present

## 2016-05-18 DIAGNOSIS — J181 Lobar pneumonia, unspecified organism: Secondary | ICD-10-CM | POA: Diagnosis not present

## 2016-05-18 DIAGNOSIS — N186 End stage renal disease: Secondary | ICD-10-CM | POA: Diagnosis not present

## 2016-05-18 DIAGNOSIS — I480 Paroxysmal atrial fibrillation: Secondary | ICD-10-CM

## 2016-05-18 DIAGNOSIS — Z5181 Encounter for therapeutic drug level monitoring: Secondary | ICD-10-CM

## 2016-05-18 DIAGNOSIS — N3000 Acute cystitis without hematuria: Secondary | ICD-10-CM | POA: Diagnosis not present

## 2016-05-18 DIAGNOSIS — I4891 Unspecified atrial fibrillation: Secondary | ICD-10-CM

## 2016-05-18 DIAGNOSIS — I129 Hypertensive chronic kidney disease with stage 1 through stage 4 chronic kidney disease, or unspecified chronic kidney disease: Secondary | ICD-10-CM | POA: Diagnosis not present

## 2016-05-19 DIAGNOSIS — Z992 Dependence on renal dialysis: Secondary | ICD-10-CM | POA: Diagnosis not present

## 2016-05-19 DIAGNOSIS — I129 Hypertensive chronic kidney disease with stage 1 through stage 4 chronic kidney disease, or unspecified chronic kidney disease: Secondary | ICD-10-CM | POA: Diagnosis not present

## 2016-05-19 DIAGNOSIS — N186 End stage renal disease: Secondary | ICD-10-CM | POA: Diagnosis not present

## 2016-05-21 DIAGNOSIS — R52 Pain, unspecified: Secondary | ICD-10-CM | POA: Diagnosis not present

## 2016-05-21 DIAGNOSIS — D688 Other specified coagulation defects: Secondary | ICD-10-CM | POA: Diagnosis not present

## 2016-05-21 DIAGNOSIS — D509 Iron deficiency anemia, unspecified: Secondary | ICD-10-CM | POA: Diagnosis not present

## 2016-05-21 DIAGNOSIS — N186 End stage renal disease: Secondary | ICD-10-CM | POA: Diagnosis not present

## 2016-05-21 DIAGNOSIS — R51 Headache: Secondary | ICD-10-CM | POA: Diagnosis not present

## 2016-05-21 DIAGNOSIS — I482 Chronic atrial fibrillation: Secondary | ICD-10-CM | POA: Diagnosis not present

## 2016-06-03 ENCOUNTER — Other Ambulatory Visit: Payer: Self-pay | Admitting: Geriatric Medicine

## 2016-06-03 ENCOUNTER — Ambulatory Visit
Admission: RE | Admit: 2016-06-03 | Discharge: 2016-06-03 | Disposition: A | Discharge status: Home / Self Care | Payer: Medicare Other | Source: Ambulatory Visit | Attending: Geriatric Medicine | Admitting: Geriatric Medicine

## 2016-06-03 DIAGNOSIS — J181 Lobar pneumonia, unspecified organism: Principal | ICD-10-CM

## 2016-06-03 DIAGNOSIS — J189 Pneumonia, unspecified organism: Secondary | ICD-10-CM | POA: Diagnosis not present

## 2016-06-08 ENCOUNTER — Ambulatory Visit (INDEPENDENT_AMBULATORY_CARE_PROVIDER_SITE_OTHER): Payer: Medicare Other | Admitting: *Deleted

## 2016-06-08 DIAGNOSIS — Z5181 Encounter for therapeutic drug level monitoring: Secondary | ICD-10-CM

## 2016-06-08 DIAGNOSIS — I4891 Unspecified atrial fibrillation: Secondary | ICD-10-CM | POA: Diagnosis not present

## 2016-06-08 DIAGNOSIS — I480 Paroxysmal atrial fibrillation: Secondary | ICD-10-CM

## 2016-06-08 LAB — POCT INR: INR: 1.9

## 2016-06-19 DIAGNOSIS — N186 End stage renal disease: Secondary | ICD-10-CM | POA: Diagnosis not present

## 2016-06-19 DIAGNOSIS — I129 Hypertensive chronic kidney disease with stage 1 through stage 4 chronic kidney disease, or unspecified chronic kidney disease: Secondary | ICD-10-CM | POA: Diagnosis not present

## 2016-06-19 DIAGNOSIS — Z992 Dependence on renal dialysis: Secondary | ICD-10-CM | POA: Diagnosis not present

## 2016-06-21 DIAGNOSIS — D688 Other specified coagulation defects: Secondary | ICD-10-CM | POA: Diagnosis not present

## 2016-06-21 DIAGNOSIS — N186 End stage renal disease: Secondary | ICD-10-CM | POA: Diagnosis not present

## 2016-06-21 DIAGNOSIS — R51 Headache: Secondary | ICD-10-CM | POA: Diagnosis not present

## 2016-06-21 DIAGNOSIS — D509 Iron deficiency anemia, unspecified: Secondary | ICD-10-CM | POA: Diagnosis not present

## 2016-06-21 DIAGNOSIS — I482 Chronic atrial fibrillation: Secondary | ICD-10-CM | POA: Diagnosis not present

## 2016-06-21 DIAGNOSIS — R52 Pain, unspecified: Secondary | ICD-10-CM | POA: Diagnosis not present

## 2016-06-21 DIAGNOSIS — D631 Anemia in chronic kidney disease: Secondary | ICD-10-CM | POA: Diagnosis not present

## 2016-06-21 DIAGNOSIS — N2581 Secondary hyperparathyroidism of renal origin: Secondary | ICD-10-CM | POA: Diagnosis not present

## 2016-06-29 ENCOUNTER — Ambulatory Visit (INDEPENDENT_AMBULATORY_CARE_PROVIDER_SITE_OTHER): Payer: Medicare Other | Admitting: *Deleted

## 2016-06-29 DIAGNOSIS — Z5181 Encounter for therapeutic drug level monitoring: Secondary | ICD-10-CM

## 2016-06-29 DIAGNOSIS — B9789 Other viral agents as the cause of diseases classified elsewhere: Secondary | ICD-10-CM | POA: Diagnosis not present

## 2016-06-29 DIAGNOSIS — N186 End stage renal disease: Secondary | ICD-10-CM | POA: Diagnosis not present

## 2016-06-29 DIAGNOSIS — I480 Paroxysmal atrial fibrillation: Secondary | ICD-10-CM | POA: Diagnosis not present

## 2016-06-29 DIAGNOSIS — J069 Acute upper respiratory infection, unspecified: Secondary | ICD-10-CM | POA: Diagnosis not present

## 2016-06-29 DIAGNOSIS — I4891 Unspecified atrial fibrillation: Secondary | ICD-10-CM

## 2016-06-29 DIAGNOSIS — I129 Hypertensive chronic kidney disease with stage 1 through stage 4 chronic kidney disease, or unspecified chronic kidney disease: Secondary | ICD-10-CM | POA: Diagnosis not present

## 2016-06-29 LAB — POCT INR: INR: 1.8

## 2016-07-01 ENCOUNTER — Encounter (HOSPITAL_COMMUNITY): Payer: Self-pay | Admitting: Emergency Medicine

## 2016-07-01 ENCOUNTER — Inpatient Hospital Stay (HOSPITAL_COMMUNITY)
Admission: EM | Admit: 2016-07-01 | Discharge: 2016-07-04 | DRG: 193 | Disposition: A | Discharge status: Home / Self Care | Payer: Medicare Other | Attending: Internal Medicine | Admitting: Internal Medicine

## 2016-07-01 ENCOUNTER — Emergency Department (HOSPITAL_COMMUNITY): Payer: Medicare Other

## 2016-07-01 DIAGNOSIS — R0902 Hypoxemia: Secondary | ICD-10-CM | POA: Diagnosis not present

## 2016-07-01 DIAGNOSIS — I10 Essential (primary) hypertension: Secondary | ICD-10-CM | POA: Diagnosis present

## 2016-07-01 DIAGNOSIS — N186 End stage renal disease: Secondary | ICD-10-CM | POA: Diagnosis present

## 2016-07-01 DIAGNOSIS — J189 Pneumonia, unspecified organism: Secondary | ICD-10-CM | POA: Diagnosis not present

## 2016-07-01 DIAGNOSIS — J181 Lobar pneumonia, unspecified organism: Secondary | ICD-10-CM | POA: Diagnosis not present

## 2016-07-01 DIAGNOSIS — N2581 Secondary hyperparathyroidism of renal origin: Secondary | ICD-10-CM | POA: Diagnosis not present

## 2016-07-01 DIAGNOSIS — Z8614 Personal history of Methicillin resistant Staphylococcus aureus infection: Secondary | ICD-10-CM

## 2016-07-01 DIAGNOSIS — Z7901 Long term (current) use of anticoagulants: Secondary | ICD-10-CM

## 2016-07-01 DIAGNOSIS — R404 Transient alteration of awareness: Secondary | ICD-10-CM | POA: Diagnosis not present

## 2016-07-01 DIAGNOSIS — Z8249 Family history of ischemic heart disease and other diseases of the circulatory system: Secondary | ICD-10-CM

## 2016-07-01 DIAGNOSIS — Z803 Family history of malignant neoplasm of breast: Secondary | ICD-10-CM

## 2016-07-01 DIAGNOSIS — Z885 Allergy status to narcotic agent status: Secondary | ICD-10-CM

## 2016-07-01 DIAGNOSIS — Z961 Presence of intraocular lens: Secondary | ICD-10-CM | POA: Diagnosis present

## 2016-07-01 DIAGNOSIS — Z800 Family history of malignant neoplasm of digestive organs: Secondary | ICD-10-CM

## 2016-07-01 DIAGNOSIS — H409 Unspecified glaucoma: Secondary | ICD-10-CM | POA: Diagnosis present

## 2016-07-01 DIAGNOSIS — I480 Paroxysmal atrial fibrillation: Secondary | ICD-10-CM | POA: Diagnosis not present

## 2016-07-01 DIAGNOSIS — D631 Anemia in chronic kidney disease: Secondary | ICD-10-CM | POA: Diagnosis not present

## 2016-07-01 DIAGNOSIS — I429 Cardiomyopathy, unspecified: Secondary | ICD-10-CM | POA: Diagnosis not present

## 2016-07-01 DIAGNOSIS — E8889 Other specified metabolic disorders: Secondary | ICD-10-CM | POA: Diagnosis not present

## 2016-07-01 DIAGNOSIS — R05 Cough: Secondary | ICD-10-CM | POA: Diagnosis not present

## 2016-07-01 DIAGNOSIS — I5032 Chronic diastolic (congestive) heart failure: Secondary | ICD-10-CM | POA: Diagnosis not present

## 2016-07-01 DIAGNOSIS — I132 Hypertensive heart and chronic kidney disease with heart failure and with stage 5 chronic kidney disease, or end stage renal disease: Secondary | ICD-10-CM | POA: Diagnosis not present

## 2016-07-01 DIAGNOSIS — Z8052 Family history of malignant neoplasm of bladder: Secondary | ICD-10-CM

## 2016-07-01 DIAGNOSIS — E785 Hyperlipidemia, unspecified: Secondary | ICD-10-CM | POA: Diagnosis present

## 2016-07-01 DIAGNOSIS — Z833 Family history of diabetes mellitus: Secondary | ICD-10-CM

## 2016-07-01 DIAGNOSIS — R531 Weakness: Secondary | ICD-10-CM | POA: Diagnosis not present

## 2016-07-01 DIAGNOSIS — K219 Gastro-esophageal reflux disease without esophagitis: Secondary | ICD-10-CM | POA: Diagnosis present

## 2016-07-01 DIAGNOSIS — Z9842 Cataract extraction status, left eye: Secondary | ICD-10-CM

## 2016-07-01 DIAGNOSIS — Z992 Dependence on renal dialysis: Secondary | ICD-10-CM

## 2016-07-01 DIAGNOSIS — Z9841 Cataract extraction status, right eye: Secondary | ICD-10-CM

## 2016-07-01 DIAGNOSIS — Z79899 Other long term (current) drug therapy: Secondary | ICD-10-CM

## 2016-07-01 LAB — CBC WITH DIFFERENTIAL/PLATELET
Basophils Absolute: 0 10*3/uL (ref 0.0–0.1)
Basophils Relative: 0 %
Eosinophils Absolute: 0 10*3/uL (ref 0.0–0.7)
Eosinophils Relative: 0 %
HCT: 32.7 % — ABNORMAL LOW (ref 36.0–46.0)
Hemoglobin: 10.4 g/dL — ABNORMAL LOW (ref 12.0–15.0)
Lymphocytes Relative: 8 %
Lymphs Abs: 0.7 10*3/uL (ref 0.7–4.0)
MCH: 30.1 pg (ref 26.0–34.0)
MCHC: 31.8 g/dL (ref 30.0–36.0)
MCV: 94.8 fL (ref 78.0–100.0)
Monocytes Absolute: 0.2 10*3/uL (ref 0.1–1.0)
Monocytes Relative: 3 %
Neutro Abs: 8.4 10*3/uL — ABNORMAL HIGH (ref 1.7–7.7)
Neutrophils Relative %: 89 %
Platelets: 185 10*3/uL (ref 150–400)
RBC: 3.45 MIL/uL — ABNORMAL LOW (ref 3.87–5.11)
RDW: 15.5 % (ref 11.5–15.5)
WBC: 9.4 10*3/uL (ref 4.0–10.5)

## 2016-07-01 LAB — COMPREHENSIVE METABOLIC PANEL
ALT: 21 U/L (ref 14–54)
AST: 25 U/L (ref 15–41)
Albumin: 3.1 g/dL — ABNORMAL LOW (ref 3.5–5.0)
Alkaline Phosphatase: 81 U/L (ref 38–126)
Anion gap: 14 (ref 5–15)
BUN: 32 mg/dL — ABNORMAL HIGH (ref 6–20)
CO2: 25 mmol/L (ref 22–32)
Calcium: 8.7 mg/dL — ABNORMAL LOW (ref 8.9–10.3)
Chloride: 97 mmol/L — ABNORMAL LOW (ref 101–111)
Creatinine, Ser: 5.34 mg/dL — ABNORMAL HIGH (ref 0.44–1.00)
GFR calc Af Amer: 8 mL/min — ABNORMAL LOW (ref >60)
GFR calc non Af Amer: 7 mL/min — ABNORMAL LOW (ref >60)
Glucose, Bld: 101 mg/dL — ABNORMAL HIGH (ref 65–99)
Potassium: 3.9 mmol/L (ref 3.5–5.1)
Sodium: 136 mmol/L (ref 135–145)
Total Bilirubin: 0.3 mg/dL (ref 0.3–1.2)
Total Protein: 5.8 g/dL — ABNORMAL LOW (ref 6.5–8.1)

## 2016-07-01 LAB — I-STAT CG4 LACTIC ACID, ED: Lactic Acid, Venous: 1.45 mmol/L (ref 0.5–1.9)

## 2016-07-01 MED ORDER — ACETAMINOPHEN 500 MG PO TABS
1000.0000 mg | ORAL_TABLET | Freq: Once | ORAL | Status: DC
  Filled 2016-07-01: qty 2

## 2016-07-01 MED ORDER — ONDANSETRON 4 MG PO TBDP
4.0000 mg | ORAL_TABLET | Freq: Once | ORAL | Status: AC
  Administered 2016-07-01: 4 mg via ORAL
  Filled 2016-07-01: qty 1

## 2016-07-01 NOTE — ED Notes (Signed)
Pt up to desk, stating she wants to leave.  This Rn encouraged patient to stay, will work on bed, patient agreeable.

## 2016-07-01 NOTE — ED Triage Notes (Addendum)
Patient with nausea, vomiting and chills for the last hour per EMS.  Patient is feeling weak and has body aches.  Patient with fever.  Patient is a dialysis patient and is due tomorrow for dialysis. Patient took two APAP before coming to ED at 6pm.

## 2016-07-02 DIAGNOSIS — J189 Pneumonia, unspecified organism: Secondary | ICD-10-CM

## 2016-07-02 DIAGNOSIS — H409 Unspecified glaucoma: Secondary | ICD-10-CM | POA: Diagnosis present

## 2016-07-02 DIAGNOSIS — J181 Lobar pneumonia, unspecified organism: Secondary | ICD-10-CM

## 2016-07-02 DIAGNOSIS — Z9841 Cataract extraction status, right eye: Secondary | ICD-10-CM | POA: Diagnosis not present

## 2016-07-02 DIAGNOSIS — R0902 Hypoxemia: Secondary | ICD-10-CM

## 2016-07-02 DIAGNOSIS — Z7901 Long term (current) use of anticoagulants: Secondary | ICD-10-CM | POA: Diagnosis not present

## 2016-07-02 DIAGNOSIS — Z8249 Family history of ischemic heart disease and other diseases of the circulatory system: Secondary | ICD-10-CM | POA: Diagnosis not present

## 2016-07-02 DIAGNOSIS — D631 Anemia in chronic kidney disease: Secondary | ICD-10-CM | POA: Diagnosis not present

## 2016-07-02 DIAGNOSIS — I5032 Chronic diastolic (congestive) heart failure: Secondary | ICD-10-CM | POA: Diagnosis present

## 2016-07-02 DIAGNOSIS — Z79899 Other long term (current) drug therapy: Secondary | ICD-10-CM | POA: Diagnosis not present

## 2016-07-02 DIAGNOSIS — I10 Essential (primary) hypertension: Secondary | ICD-10-CM

## 2016-07-02 DIAGNOSIS — K219 Gastro-esophageal reflux disease without esophagitis: Secondary | ICD-10-CM | POA: Diagnosis present

## 2016-07-02 DIAGNOSIS — I132 Hypertensive heart and chronic kidney disease with heart failure and with stage 5 chronic kidney disease, or end stage renal disease: Secondary | ICD-10-CM | POA: Diagnosis present

## 2016-07-02 DIAGNOSIS — I120 Hypertensive chronic kidney disease with stage 5 chronic kidney disease or end stage renal disease: Secondary | ICD-10-CM | POA: Diagnosis not present

## 2016-07-02 DIAGNOSIS — Z833 Family history of diabetes mellitus: Secondary | ICD-10-CM | POA: Diagnosis not present

## 2016-07-02 DIAGNOSIS — E8889 Other specified metabolic disorders: Secondary | ICD-10-CM | POA: Diagnosis present

## 2016-07-02 DIAGNOSIS — Z9842 Cataract extraction status, left eye: Secondary | ICD-10-CM | POA: Diagnosis not present

## 2016-07-02 DIAGNOSIS — I429 Cardiomyopathy, unspecified: Secondary | ICD-10-CM | POA: Diagnosis present

## 2016-07-02 DIAGNOSIS — Z885 Allergy status to narcotic agent status: Secondary | ICD-10-CM | POA: Diagnosis not present

## 2016-07-02 DIAGNOSIS — N2581 Secondary hyperparathyroidism of renal origin: Secondary | ICD-10-CM | POA: Diagnosis not present

## 2016-07-02 DIAGNOSIS — Z8052 Family history of malignant neoplasm of bladder: Secondary | ICD-10-CM | POA: Diagnosis not present

## 2016-07-02 DIAGNOSIS — Z961 Presence of intraocular lens: Secondary | ICD-10-CM | POA: Diagnosis present

## 2016-07-02 DIAGNOSIS — I480 Paroxysmal atrial fibrillation: Secondary | ICD-10-CM | POA: Diagnosis not present

## 2016-07-02 DIAGNOSIS — Z992 Dependence on renal dialysis: Secondary | ICD-10-CM | POA: Diagnosis not present

## 2016-07-02 DIAGNOSIS — Z8614 Personal history of Methicillin resistant Staphylococcus aureus infection: Secondary | ICD-10-CM | POA: Diagnosis not present

## 2016-07-02 DIAGNOSIS — N186 End stage renal disease: Secondary | ICD-10-CM | POA: Diagnosis not present

## 2016-07-02 DIAGNOSIS — E785 Hyperlipidemia, unspecified: Secondary | ICD-10-CM | POA: Diagnosis present

## 2016-07-02 HISTORY — DX: Pneumonia, unspecified organism: J18.9

## 2016-07-02 LAB — URINALYSIS, ROUTINE W REFLEX MICROSCOPIC
Bilirubin Urine: NEGATIVE
Glucose, UA: NEGATIVE mg/dL
Hgb urine dipstick: NEGATIVE
Ketones, ur: NEGATIVE mg/dL
Leukocytes, UA: NEGATIVE
Nitrite: NEGATIVE
Protein, ur: NEGATIVE mg/dL
Specific Gravity, Urine: 1.021 (ref 1.005–1.030)
pH: 8 (ref 5.0–8.0)

## 2016-07-02 LAB — RENAL FUNCTION PANEL
Albumin: 2.8 g/dL — ABNORMAL LOW (ref 3.5–5.0)
Anion gap: 13 (ref 5–15)
BUN: 35 mg/dL — ABNORMAL HIGH (ref 6–20)
CO2: 25 mmol/L (ref 22–32)
Calcium: 8.1 mg/dL — ABNORMAL LOW (ref 8.9–10.3)
Chloride: 96 mmol/L — ABNORMAL LOW (ref 101–111)
Creatinine, Ser: 5.72 mg/dL — ABNORMAL HIGH (ref 0.44–1.00)
GFR calc Af Amer: 8 mL/min — ABNORMAL LOW (ref >60)
GFR calc non Af Amer: 7 mL/min — ABNORMAL LOW (ref >60)
Glucose, Bld: 116 mg/dL — ABNORMAL HIGH (ref 65–99)
Phosphorus: 2.7 mg/dL (ref 2.5–4.6)
Potassium: 3.8 mmol/L (ref 3.5–5.1)
Sodium: 134 mmol/L — ABNORMAL LOW (ref 135–145)

## 2016-07-02 LAB — INFLUENZA PANEL BY PCR (TYPE A & B)
Influenza A By PCR: NEGATIVE
Influenza B By PCR: NEGATIVE

## 2016-07-02 LAB — HIV ANTIBODY (ROUTINE TESTING W REFLEX): HIV Screen 4th Generation wRfx: NONREACTIVE

## 2016-07-02 LAB — PROTIME-INR
INR: 1.63
Prothrombin Time: 19.5 seconds — ABNORMAL HIGH (ref 11.4–15.2)

## 2016-07-02 LAB — STREP PNEUMONIAE URINARY ANTIGEN: Strep Pneumo Urinary Antigen: NEGATIVE

## 2016-07-02 LAB — MRSA PCR SCREENING: MRSA by PCR: NEGATIVE

## 2016-07-02 MED ORDER — TIMOLOL MALEATE 0.5 % OP SOLN
1.0000 [drp] | Freq: Every morning | OPHTHALMIC | Status: DC
  Administered 2016-07-02 – 2016-07-04 (×3): 1 [drp] via OPHTHALMIC
  Filled 2016-07-02 (×2): qty 5

## 2016-07-02 MED ORDER — OSELTAMIVIR PHOSPHATE 30 MG PO CAPS
30.0000 mg | ORAL_CAPSULE | ORAL | Status: DC
  Filled 2016-07-02: qty 1

## 2016-07-02 MED ORDER — PENTAFLUOROPROP-TETRAFLUOROETH EX AERO: 1.0000 "application " | INHALATION_SPRAY | CUTANEOUS | Status: DC | PRN

## 2016-07-02 MED ORDER — LATANOPROST 0.005 % OP SOLN
1.0000 [drp] | Freq: Every day | OPHTHALMIC | Status: DC
Start: 2016-07-02 — End: 2016-07-04
  Administered 2016-07-03 (×2): 1 [drp] via OPHTHALMIC
  Filled 2016-07-02 (×2): qty 2.5

## 2016-07-02 MED ORDER — WARFARIN SODIUM 7.5 MG PO TABS
7.5000 mg | ORAL_TABLET | Freq: Once | ORAL | Status: AC
  Administered 2016-07-02: 7.5 mg via ORAL
  Filled 2016-07-02: qty 1

## 2016-07-02 MED ORDER — HEPARIN SODIUM (PORCINE) 1000 UNIT/ML DIALYSIS: 1000.0000 [IU] | INTRAMUSCULAR | Status: DC | PRN

## 2016-07-02 MED ORDER — LIDOCAINE-PRILOCAINE 2.5-2.5 % EX CREA: 1.0000 "application " | TOPICAL_CREAM | CUTANEOUS | Status: DC | PRN

## 2016-07-02 MED ORDER — ACETAMINOPHEN 500 MG PO TABS
1000.0000 mg | ORAL_TABLET | Freq: Four times a day (QID) | ORAL | Status: DC | PRN
  Administered 2016-07-02 – 2016-07-04 (×5): 1000 mg via ORAL
  Filled 2016-07-02 (×5): qty 2

## 2016-07-02 MED ORDER — LIDOCAINE HCL (PF) 1 % IJ SOLN: 5.0000 mL | INTRAMUSCULAR | Status: DC | PRN

## 2016-07-02 MED ORDER — CALCIUM ACETATE (PHOS BINDER) 667 MG PO CAPS
1334.0000 mg | ORAL_CAPSULE | Freq: Three times a day (TID) | ORAL | Status: DC
  Administered 2016-07-02 – 2016-07-04 (×6): 1334 mg via ORAL
  Filled 2016-07-02 (×7): qty 2

## 2016-07-02 MED ORDER — SODIUM CHLORIDE 0.9 % IV SOLN: 100.0000 mL | INTRAVENOUS | Status: DC | PRN

## 2016-07-02 MED ORDER — VANCOMYCIN HCL IN DEXTROSE 1-5 GM/200ML-% IV SOLN
1000.0000 mg | Freq: Once | INTRAVENOUS | Status: DC
  Administered 2016-07-02: 1000 mg via INTRAVENOUS
  Filled 2016-07-02: qty 200

## 2016-07-02 MED ORDER — VANCOMYCIN HCL IN DEXTROSE 1-5 GM/200ML-% IV SOLN: 1000.0000 mg | INTRAVENOUS | Status: DC

## 2016-07-02 MED ORDER — WARFARIN - PHARMACIST DOSING INPATIENT
Freq: Every day | Status: DC
  Administered 2016-07-02: 21:00:00

## 2016-07-02 MED ORDER — SACCHAROMYCES BOULARDII 250 MG PO CAPS
250.0000 mg | ORAL_CAPSULE | Freq: Two times a day (BID) | ORAL | Status: DC
  Administered 2016-07-02 – 2016-07-04 (×5): 250 mg via ORAL
  Filled 2016-07-02 (×5): qty 1

## 2016-07-02 MED ORDER — DEXTROSE 5 % IV SOLN
1.0000 g | INTRAVENOUS | Status: DC
  Administered 2016-07-02 – 2016-07-03 (×2): 1 g via INTRAVENOUS
  Filled 2016-07-02 (×2): qty 1

## 2016-07-02 MED ORDER — FAMOTIDINE 20 MG PO TABS
20.0000 mg | ORAL_TABLET | Freq: Every day | ORAL | Status: DC
  Administered 2016-07-03 – 2016-07-04 (×2): 20 mg via ORAL
  Filled 2016-07-02 (×2): qty 1

## 2016-07-02 MED ORDER — HEPARIN SODIUM (PORCINE) 1000 UNIT/ML DIALYSIS: 20.0000 [IU]/kg | INTRAMUSCULAR | Status: DC | PRN

## 2016-07-02 MED ORDER — DEXTROSE 5 % IV SOLN
1.0000 g | Freq: Once | INTRAVENOUS | Status: AC
  Administered 2016-07-02: 1 g via INTRAVENOUS
  Filled 2016-07-02: qty 10

## 2016-07-02 MED ORDER — VANCOMYCIN HCL 10 G IV SOLR
1750.0000 mg | Freq: Once | INTRAVENOUS | Status: AC
  Administered 2016-07-02: 1750 mg via INTRAVENOUS
  Filled 2016-07-02: qty 1750

## 2016-07-02 MED ORDER — AMIODARONE HCL 200 MG PO TABS
200.0000 mg | ORAL_TABLET | Freq: Every day | ORAL | Status: DC
  Administered 2016-07-02 – 2016-07-04 (×3): 200 mg via ORAL
  Filled 2016-07-02 (×3): qty 1

## 2016-07-02 MED ORDER — ADULT MULTIVITAMIN W/MINERALS CH
1.0000 | ORAL_TABLET | Freq: Every day | ORAL | Status: DC
  Administered 2016-07-02 – 2016-07-04 (×3): 1 via ORAL
  Filled 2016-07-02 (×3): qty 1

## 2016-07-02 MED ORDER — FAMOTIDINE 20 MG PO TABS
20.0000 mg | ORAL_TABLET | Freq: Two times a day (BID) | ORAL | Status: DC
  Administered 2016-07-02: 20 mg via ORAL
  Filled 2016-07-02: qty 1

## 2016-07-02 MED ORDER — DEXTROSE 5 % IV SOLN
500.0000 mg | Freq: Once | INTRAVENOUS | Status: AC
  Administered 2016-07-02: 500 mg via INTRAVENOUS
  Filled 2016-07-02: qty 500

## 2016-07-02 MED ORDER — DEXTROSE 5 % IV SOLN
2.0000 g | INTRAVENOUS | Status: DC
  Administered 2016-07-02: 2 g via INTRAVENOUS
  Filled 2016-07-02: qty 2

## 2016-07-02 MED ORDER — ALTEPLASE 2 MG IJ SOLR: 2.0000 mg | Freq: Once | INTRAMUSCULAR | Status: DC | PRN

## 2016-07-02 NOTE — ED Notes (Signed)
Ambulating pt on the unit pt ambulated well when pt started to ambulated pt o2 was 97% when returning to her pt o2 sats dropped to 87% asked pt if she felt SOB pt says no no other complaints noted at this time

## 2016-07-02 NOTE — Progress Notes (Signed)
PT Cancellation Note  Patient Details Name: [name] MRN: [id number] DOB: 09-02-1945   Cancelled Treatment:    Reason Eval/Treat Not Completed: Patient at procedure or test/unavailable.  Patient in HD this pm.  Will return tomorrow for PT evaluation.   Despina Pole 07/02/2016, 5:10 PM Carita Pian. Sanjuana Kava, [place] Pager [phone]

## 2016-07-02 NOTE — ED Notes (Addendum)
Pt reports have a fever, SOB, confusion, and dry cough. Pt went to her PCP on Wednesday and was given Guifenasin with codeine. Family reports 101.7. Pt has a history of pneumonia.

## 2016-07-02 NOTE — Progress Notes (Signed)
PROGRESS NOTE        PATIENT DETAILS Name: [name] Age: 71 y.o. Sex: female Date of Birth: Mar 15, 1946 Admit Date: 07/01/2016 Admitting Physician Etta Quill, DO ZOX:[id number],WJX THOMAS, MD  Brief Narrative: Patient is a 71 y.o. female with past medical history of ESRD on HD-TTS, paroxysmal atrial fibrillation on anticoagulation admitted with fever, confusion, found to have PNA. See below for further details  Subjective: Feels much better  Assessment/Plan: HCAP: Clinically improved, continue vancomycin and cefepime. Influenza PCR negative. Blood cultures pending. Follow clinical course  ESRD: HD TTS-nephrology consulted.  Paroxysmal atrial fibrillation: Heart rate appears to be adequately controlled on amiodarone, INR subtherapeutic-pharmacy dosing Coumadin while inpatient  Anemia: Likely related to ESRD, defer to nephrology  DVT Prophylaxis: Full dose anticoagulation with Coumadin  Code Status: Full code   Family Communication: None at bedside  Disposition Plan: Remain inpatient-but will plan on Home health vs SNF on discharge  Antimicrobial agents: Anti-infectives    Start     Dose/Rate Route Frequency Ordered Stop   07/05/16 1200  vancomycin (VANCOCIN) IVPB 1000 mg/200 mL premix     1,000 mg 200 mL/hr over 60 Minutes Intravenous Every T-Th-Sa (Hemodialysis) 07/02/16 1128     07/02/16 2000  ceFEPIme (MAXIPIME) 1 g in dextrose 5 % 50 mL IVPB     1 g 100 mL/hr over 30 Minutes Intravenous Every 24 hours 07/02/16 1118     07/02/16 1200  vancomycin (VANCOCIN) IVPB 1000 mg/200 mL premix  Status:  Discontinued     1,000 mg 200 mL/hr over 60 Minutes Intravenous Every T-Th-Sa (Hemodialysis) 07/02/16 0410 07/02/16 1128   07/02/16 0630  ceFEPIme (MAXIPIME) 2 g in dextrose 5 % 50 mL IVPB  Status:  Discontinued     2 g 100 mL/hr over 30 Minutes Intravenous Once per day on Sun Wed Fri 07/02/16 0622 07/02/16 1118   07/02/16 0500  oseltamivir  (TAMIFLU) capsule 30 mg  Status:  Discontinued     30 mg Oral Every T-Th-Sa (1800) 07/02/16 0406 07/02/16 0558   07/02/16 0415  vancomycin (VANCOCIN) 1,750 mg in sodium chloride 0.9 % 500 mL IVPB     1,750 mg 250 mL/hr over 120 Minutes Intravenous  Once 07/02/16 0410 07/02/16 0716   07/02/16 0200  vancomycin (VANCOCIN) IVPB 1000 mg/200 mL premix  Status:  Discontinued     1,000 mg 200 mL/hr over 60 Minutes Intravenous  Once 07/02/16 0158 07/02/16 0510   07/02/16 0200  azithromycin (ZITHROMAX) 500 mg in dextrose 5 % 250 mL IVPB     500 mg 250 mL/hr over 60 Minutes Intravenous  Once 07/02/16 0158 07/02/16 0510   07/02/16 0200  cefTRIAXone (ROCEPHIN) 1 g in dextrose 5 % 50 mL IVPB     1 g 100 mL/hr over 30 Minutes Intravenous  Once 07/02/16 0158 07/02/16 0407      Procedures: None  CONSULTS:  nephrology  Time spent: 25- minutes-Greater than 50% of this time was spent in counseling, explanation of diagnosis, planning of further management, and coordination of care.  MEDICATIONS: Scheduled Meds: . amiodarone  200 mg Oral Daily  . calcium acetate  1,334 mg Oral TID WC  . ceFEPime (MAXIPIME) IV  1 g Intravenous Q24H  . [START ON 07/03/2016] famotidine  20 mg Oral Daily  . latanoprost  1 drop Both Eyes QHS  .  multivitamin with minerals  1 tablet Oral Daily  . saccharomyces boulardii  250 mg Oral BID  . timolol  1 drop Both Eyes q morning - 10a  . [START ON 07/05/2016] vancomycin  1,000 mg Intravenous Q T,Th,Sa-HD  . warfarin  7.5 mg Oral ONCE-1800  . Warfarin - Pharmacist Dosing Inpatient   Does not apply q1800   Continuous Infusions: PRN Meds:.acetaminophen   PHYSICAL EXAM: Vital signs: Vitals:   07/02/16 0400 07/02/16 0415 07/02/16 0504 07/02/16 0934  BP: (!) 124/51 (!) 140/50 (!) 130/48 (!) 96/51  Pulse: 88 85 92 83  Resp: 18 25 (!) 22 20  Temp:   98.1 F (36.7 C) 98.3 F (36.8 C)  TempSrc:   Oral Oral  SpO2: 100% 98% 100% 100%  Weight: 82.5 kg (181 lb 14.1 oz)  82.9  kg (182 lb 12.2 oz)   Height: 5\' 3"  (1.6 m)  5\' 3"  (1.6 m)    Filed Weights   07/02/16 0400 07/02/16 0504  Weight: 82.5 kg (181 lb 14.1 oz) 82.9 kg (182 lb 12.2 oz)   Body mass index is 32.37 kg/m.   General appearance :Awake, alert, not in any distress. Speech Clear. Not toxic Looking Eyes:, pupils equally reactive to light and accomodation,no scleral icterus.Pink conjunctiva HEENT: Atraumatic and Normocephalic Neck: supple, no JVD. No cervical lymphadenopathy. No thyromegaly Resp:Good air entry bilaterally, no added sounds  CVS: S1 S2 regular, no murmurs.  GI: Bowel sounds present, Non tender and not distended with no gaurding, rigidity or rebound.No organomegaly Extremities: B/L Lower Ext shows no edema, both legs are warm to touch Neurology:  speech clear,Non focal, sensation is grossly intact. Psychiatric: Normal judgment and insight. Alert and oriented x 3. Normal mood. Musculoskeletal:No digital cyanosis Skin:No Rash, warm and dry Wounds:N/A  I have personally reviewed following labs and imaging studies  LABORATORY DATA: CBC:  Recent Labs Lab 07/01/16 1946  WBC 9.4  NEUTROABS 8.4*  HGB 10.4*  HCT 32.7*  MCV 94.8  PLT 449    Basic Metabolic Panel:  Recent Labs Lab 07/01/16 1946  NA 136  K 3.9  CL 97*  CO2 25  GLUCOSE 101*  BUN 32*  CREATININE 5.34*  CALCIUM 8.7*    GFR: Estimated Creatinine Clearance: 9.9 mL/min (by C-G formula based on SCr of 5.34 mg/dL (H)).  Liver Function Tests:  Recent Labs Lab 07/01/16 1946  AST 25  ALT 21  ALKPHOS 81  BILITOT 0.3  PROT 5.8*  ALBUMIN 3.1*   No results for input(s): LIPASE, AMYLASE in the last 168 hours. No results for input(s): AMMONIA in the last 168 hours.  Coagulation Profile:  Recent Labs Lab 06/29/16 1124 07/02/16 0120  INR 1.8 1.63    Cardiac Enzymes: No results for input(s): CKTOTAL, CKMB, CKMBINDEX, TROPONINI in the last 168 hours.  BNP (last 3 results) No results for input(s):  PROBNP in the last 8760 hours.  HbA1C: No results for input(s): HGBA1C in the last 72 hours.  CBG: No results for input(s): GLUCAP in the last 168 hours.  Lipid Profile: No results for input(s): CHOL, HDL, LDLCALC, TRIG, CHOLHDL, LDLDIRECT in the last 72 hours.  Thyroid Function Tests: No results for input(s): TSH, T4TOTAL, FREET4, T3FREE, THYROIDAB in the last 72 hours.  Anemia Panel: No results for input(s): VITAMINB12, FOLATE, FERRITIN, TIBC, IRON, RETICCTPCT in the last 72 hours.  Urine analysis:    Component Value Date/Time   COLORURINE YELLOW 07/01/2016 1940   APPEARANCEUR CLOUDY (A) 07/01/2016 1940  LABSPEC 1.021 07/01/2016 1940   PHURINE 8.0 07/01/2016 1940   GLUCOSEU NEGATIVE 07/01/2016 1940   HGBUR NEGATIVE 07/01/2016 1940   BILIRUBINUR NEGATIVE 07/01/2016 1940   BILIRUBINUR neg 09/18/2014 0920   KETONESUR NEGATIVE 07/01/2016 1940   PROTEINUR NEGATIVE 07/01/2016 1940   UROBILINOGEN 0.2 05/02/2015 2020   NITRITE NEGATIVE 07/01/2016 1940   LEUKOCYTESUR NEGATIVE 07/01/2016 1940    Sepsis Labs: Lactic Acid, Venous    Component Value Date/Time   LATICACIDVEN 1.45 07/01/2016 2007    MICROBIOLOGY: Recent Results (from the past 240 hour(s))  MRSA PCR Screening     Status: None   Collection Time: 07/02/16  5:53 AM  Result Value Ref Range Status   MRSA by PCR NEGATIVE NEGATIVE Final    Comment:        The GeneXpert MRSA Assay (FDA approved for NASAL specimens only), is one component of a comprehensive MRSA colonization surveillance program. It is not intended to diagnose MRSA infection nor to guide or monitor treatment for MRSA infections.     RADIOLOGY STUDIES/RESULTS: Dg Chest 2 View  Result Date: 07/01/2016 CLINICAL DATA:  Patient started feeling bad at 6 p.m. today. Dry cough. Fever. EXAM: CHEST  2 VIEW COMPARISON:  05/24/2016. FINDINGS: Cardiomegaly. Hiatal hernia. There is LEFT lower lobe opacity consistent with early pneumonia, worse from  priors. Mild vascular congestion. No significant effusion or pneumothorax. IMPRESSION: Cardiomegaly.  Early LEFT lower lobe pneumonia. Electronically Signed   By: Staci Righter M.D.   On: 07/01/2016 20:39   Dg Chest 2 View  Result Date: 06/03/2016 CLINICAL DATA:  Followup left lower lobe pneumonia. EXAM: CHEST  2 VIEW COMPARISON:  05/11/2016. FINDINGS: Stable mildly enlarged cardiac silhouette. Moderate to large-sized hiatal hernia. Clear lungs diffuse prominence of the pulmonary vasculature and interstitial markings with mild improvement. No pleural fluid. IMPRESSION: 1. Resolved left lower lobe pneumonia or atelectasis. 2. Stable mild cardiomegaly. 3. Moderate to large-sized hiatal hernia. 4. Pulmonary vascular congestion with mild improvement. 5. Mild chronic interstitial lung disease. Electronically Signed   By: Claudie Revering M.D.   On: 06/03/2016 12:45     LOS: 0 days   Oren Binet, MD  [facility] Pager:[phone]  If 7PM-7AM, please contact night-coverage [email or web address] Password [facility] 07/02/2016, 12:51 PM

## 2016-07-02 NOTE — ED Provider Notes (Signed)
By signing my name below, I, Neta Mends, attest that this documentation has been prepared under the direction and in the presence of Tappen, DO . Electronically Signed: Neta Mends, ED Scribe. 07/02/2016. 12:44 AM.   TIME SEEN: 12:39 AM   CHIEF COMPLAINT:  Chief Complaint  Patient presents with  . Influenza  . Fever    HPI:  HPI Comments:  [name] is a 71 y.o. female with PMHx of Atrial fibrillation on Coumadin, cardiomyopathy, end-stage renal disease on hemodialysis Tuesday, Thursday and Saturday who was last dialyzed on Thursday who presents to the Emergency Department via EMS complaining of a continuing fever since yesterday. Pt states that her fever was 101.7 at home. Pt complains of associated confusion that has improved, chills, diarrhea that resolved Monday, mild diffuse throbbing headache, generalized weakness, and generalized body aches. Pt received a flu shot and pneumonia vaccination this year. Per triage note, pt took 2 APAP before coming to the ED at 1800 yesterday. Pt denies vomiting, chest pain, abdominal pain. No neck pain or neck stiffness. No rash.   ROS: See HPI Constitutional: Positive for fever, chills Eyes: no drainage  ENT: no runny nose   Cardiovascular:  no chest pain  Resp: no SOB  GI: no vomiting GU: Positive for diarrhea; no dysuria Integumentary: no rash  Allergy: no hives  Musculoskeletal: Positive for generalized body aches; no leg swelling  Neurological: Positive for confusion; no slurred speech ROS otherwise negative  PAST MEDICAL HISTORY/PAST SURGICAL HISTORY:  Past Medical History:  Diagnosis Date  . Cardiomyopathy ([place])    a. h/o LV dysfunction EF 20-25% in 2013 due to sepsis.>> improved to normal   . Chronic diastolic CHF (congestive heart failure) ([place])    10/ 2013 in setting of septic shock  . Complication of anesthesia    use a little anesthesia , per patient MD states she quit breathing (2016); hard to  wake up  . ESRD (end stage renal disease) ([place])    dialysis Tues, Thurs, Sat  . GERD (gastroesophageal reflux disease)   . Glaucoma   . Glaucoma   . H/O hiatal hernia    a. CT 2017: large gastric hiatal hernia.  Marland Kitchen Headache(784.0)    migraine  . History of blood transfusion 04/13/2015   . History of echocardiogram    a. Echo 6/17: EF 60-65%, normal wall motion, mild MR, atrial septal lipomatous hypertrophy, PASP 34 mmHg, possible trivial free-flowing pericardial effusion along RV free wall // b. Echo 5/17: Mild LVH, EF 55-60%, normal wall motion, grade 1 diastolic dysfunction, trivial MR, severe LAE, mild RAE, PASP 42 mmHg  . History of nephrostomy 04/11/2015    currently inplace 04/28/2015   . History of nuclear stress test    a. Myoview 1/14 - Marked ischemia in the basal anterior, mid anterior, apical septal and apical inferior regions, EF 63% >> LHC normal   . Hyperlipidemia   . Hypertension    medication removed from regimen due to low blood pressure   . Iron deficiency    hx  . Nephrolithiasis 2002, 2006   bilateral  . Normal coronary arteries 2014   a. LHC in 1/14: normal coronary arteries  . PAF (paroxysmal atrial fibrillation) ([place])    a. 10/ 2013  in setting of Septic Shock //  b. recurrent during admit for pneumonia, L effusion >> placed on Amiodarone // Coumadin for anticoagulation  . Pneumonia    dx 10-06-2014 per CXR--  on  10-27-2014 pt states finished antibiotic and denies cough or fever  . S/P hemodialysis catheter insertion ([place]) 04/11/2015    right anterior chest , only used once   . Sigmoid diverticulosis   . UTI (urinary tract infection) 05/10/2016    MEDICATIONS:  Prior to Admission medications   Medication Sig Start Date End Date Taking? Authorizing Provider  acetaminophen (TYLENOL) 500 MG tablet Take 1,000 mg by mouth every 6 (six) hours as needed for moderate pain or headache.     Historical Provider, MD  amiodarone (PACERONE) 200 MG tablet Take 1 tablet  (200 mg total) by mouth daily. 12/07/15   Liliane Shi, PA-C  bimatoprost (LUMIGAN) 0.01 % SOLN Place 1 drop into both eyes at bedtime.    Historical Provider, MD  calcium acetate (PHOSLO) 667 MG capsule Take 667 mg by mouth 3 (three) times daily with meals.    Historical Provider, MD  cephALEXin (KEFLEX) 250 MG capsule Take 250 mg by mouth daily.    Historical Provider, MD  Multiple Vitamins-Minerals (MULTIVITAMIN WITH MINERALS) tablet Take 1 tablet by mouth daily.    Historical Provider, MD  Nutritional Supplements (FEEDING SUPPLEMENT, NEPRO CARB STEADY,) LIQD Take 237 mLs by mouth daily. Patient taking differently: Take 237 mLs by mouth daily as needed (nutritional supplement).  05/07/15   Maryann Mikhail, DO  ranitidine (ZANTAC) 150 MG tablet Take 150 mg by mouth 2 (two) times daily as needed for heartburn.    Historical Provider, MD  saccharomyces boulardii (FLORASTOR) 250 MG capsule Take 1 capsule (250 mg total) by mouth 2 (two) times daily. 07/08/15   Donne Hazel, MD  sodium chloride (OCEAN) 0.65 % SOLN nasal spray Place 1 spray into both nostrils 3 (three) times daily as needed for congestion.    Historical Provider, MD  timolol (BETIMOL) 0.5 % ophthalmic solution Place 1 drop into both eyes every morning.     Historical Provider, MD  warfarin (COUMADIN) 5 MG tablet Take as directed by [facility] Patient taking differently: Take 2.5-5 mg by mouth daily at 6 PM. TAKES 5MG  ON MON, WED AND FRI  TAKES 2.5MG  ALL OTHER DAYS 02/16/16   Jettie Booze, MD    ALLERGIES:  Allergies  Allergen Reactions  . Vicodin [Hydrocodone-Acetaminophen] Nausea And Vomiting  . Chlorhexidine Rash    Sunburn    rash  . Percocet [Oxycodone-Acetaminophen] Nausea And Vomiting    SOCIAL HISTORY:  Social History  Substance Use Topics  . Smoking status: Never Smoker  . Smokeless tobacco: Never Used  . Alcohol use No    FAMILY HISTORY: Family History  Problem Relation Age of Onset  .  Hypertension Mother   . Cancer Mother 45    breast  . Dementia Mother   . Hypertension Brother   . Diabetes Brother   . Heart disease Brother     before age 44  . Cancer Father 14    pancreatic  . Heart failure Paternal Grandmother   . Bladder Cancer Maternal Grandfather     EXAM: BP (!) 124/48 (BP Location: Right Arm)   Pulse 92   Temp 98.9 F (37.2 C) (Oral)   Resp 16   LMP 01/19/1992 (Approximate)   SpO2 94%  CONSTITUTIONAL: Alert and oriented and responds appropriately to questions. Well-appearing; well-nourished, elderly, febrile but in no distress HEAD: Normocephalic EYES: Conjunctivae clear, PERRL, EOMI ENT: normal nose; no rhinorrhea; moist mucous membranes NECK: Supple, no meningismus, no nuchal rigidity, no LAD  CARD: RRR; S1 and  S2 appreciated; no murmurs, no clicks, no rubs, no gallops RESP: Normal chest excursion without splinting or tachypnea; breath sounds clear and equal bilaterally; no wheezes, no rhonchi, no rales, patient is hypoxic with ambulating and sats in the low 90s at rest off of oxygen, no respiratory distress, speaking full sentences ABD/GI: Normal bowel sounds; non-distended; soft, non-tender, no rebound, no guarding, no peritoneal signs, no hepatosplenomegaly BACK:  The back appears normal and is non-tender to palpation, there is no CVA tenderness EXT: AV fistula in left upper extremity with good thrill and no erythema, warmth, tenderness, or swelling, and 2+ left radial pulse. Normal ROM in all joints; non-tender to palpation; no edema; normal capillary refill; no cyanosis, no calf tenderness or swelling    SKIN: Normal color for age and race; warm; no rash NEURO: Moves all extremities equally, sensation to light touch intact diffusely, cranial nerves II through XII intact, normal speech PSYCH: The patient's mood and manner are appropriate. Grooming and personal hygiene are appropriate.  MEDICAL DECISION MAKING: Patient here with community-acquired  pneumonia. She does have a history of dialysis and has been MRSA positive before. Will cover with azithromycin, ceftriaxone and vancomycin. Will obtain blood cultures. Given she is hypoxic, has new oxygen requirement, will admit. PCP is Dr. Felipa Eth.  ED PROGRESS: 3:25 AM  Discussed patient's case with Hospitalist, Dr. Alcario Drought.  Recommend admission to medical/surgical, inpatient bed.  I will place holding orders per their request. Patient and family (if present) updated with plan. Care transferred to hospitalist service.  I reviewed all nursing notes, vitals, pertinent old records, EKGs, labs, imaging (as available).     I personally performed the services described in this documentation, which was scribed in my presence. The recorded information has been reviewed and is accurate.     [place], DO 07/02/16 [phone]

## 2016-07-02 NOTE — Procedures (Signed)
  I was present at this dialysis session, have reviewed the session itself and made  appropriate changes Kelly Splinter MD [place] pager [phone]   07/02/2016, 5:06 PM

## 2016-07-02 NOTE — Progress Notes (Signed)
Pharmacy Antibiotic Note  [name] is a 71 y.o. female admitted on 07/01/2016 with pneumonia.  Pharmacy has been consulted for vancomycin and cefepime dosing. ESRD on HD TTS. WBC wnl, afebrile.   Patient received two doses of vancomycin pre-HD today (1000 mg x1 and 1750 mg x1). HD ordered by nephrology for today 1/13. Estimated post-HD level ~20.5 mcg/mL. Will not schedule vancomycin dose for today. Instead will begin vancomycin 1g qHD on TuThSat on Tuesday 1/16.   Also received cefepime 2g IV x1 then with HD. Changed to cefepime 1g IV q24hr given post-HD beginning tonight.   Plan: Vancomycin 1000mg  IV qHD TuThSat to begin Tuesday 1/16.  Cefepime 1 g q24hr to be given in the evening starting 1/13 at 2000.  Monitor clinical picture, culture results, HD schedule, and LOT Vancomycin levels as indicated.   Height: 5\' 3"  (160 cm) Weight: 182 lb 12.2 oz (82.9 kg) IBW/kg (Calculated) : 52.4  Temp (24hrs), Avg:99.4 F (37.4 C), Min:98.1 F (36.7 C), Max:102.1 F (38.9 C)   Recent Labs Lab 07/01/16 1946 07/01/16 2007  WBC 9.4  --   CREATININE 5.34*  --   LATICACIDVEN  --  1.45    Estimated Creatinine Clearance: 9.9 mL/min (by C-G formula based on SCr of 5.34 mg/dL (H)).    Allergies  Allergen Reactions  . Vicodin [Hydrocodone-Acetaminophen] Nausea And Vomiting  . Chlorhexidine Rash    Sunburn    rash  . Percocet [Oxycodone-Acetaminophen] Nausea And Vomiting   Antimicrobials this admission:  1/13 vanc >>  1/13 cefepime >>  1/13 Azith x1 1/13 CTX x1  Dose adjustments this admission:  Cefepime 2g x1 given pre-HD (for HD 1/13) >> changed to cefepime 1g q24h at 2000 daily starting 1/13 Vancomycin 1g + 1750mg  given pre-HD, estimated level ~ 20.5 post-HD, NO vanc ordered for 1/13 HD session   Microbiology results:  1/13 blood cx x2: sent 1/13 MRSA pcr: neg  1/13 flu A: neg   Argie Ramming, PharmD Pharmacy Resident  Pager [phone] 07/02/16 11:41 AM

## 2016-07-02 NOTE — H&P (Signed)
History and Physical    [name] GLO:[id number] DOB: 23-Aug-1945 DOA: 07/01/2016   PCP: [name], MD Chief Complaint:  Chief Complaint  Patient presents with  . Influenza  . Fever    HPI: [name] is a 71 y.o. female with medical history significant of ESRD, HTN.  HD TTS.  Patient presents to the ED with c/o fever, cough, bodyaches, headache.  Symptoms onset yesterday.  Symptoms are persistent.  Tylenol provides minimal relief.  Tm 101.7 at home.  She has had pneumonia shot as well as flu shot this year.  ED Course: Work up in ED shows LLL PNA.  Review of Systems: As per HPI otherwise 10 point review of systems negative.    Past Medical History:  Diagnosis Date  . Cardiomyopathy ([place])    a. h/o LV dysfunction EF 20-25% in 2013 due to sepsis.>> improved to normal   . Chronic diastolic CHF (congestive heart failure) ([place])    10/ 2013 in setting of septic shock  . Complication of anesthesia    use a little anesthesia , per patient MD states she quit breathing (2016); hard to wake up  . ESRD (end stage renal disease) ([place])    dialysis Tues, Thurs, Sat  . GERD (gastroesophageal reflux disease)   . Glaucoma   . Glaucoma   . H/O hiatal hernia    a. CT 2017: large gastric hiatal hernia.  Marland Kitchen Headache(784.0)    migraine  . History of blood transfusion 04/13/2015   . History of echocardiogram    a. Echo 6/17: EF 60-65%, normal wall motion, mild MR, atrial septal lipomatous hypertrophy, PASP 34 mmHg, possible trivial free-flowing pericardial effusion along RV free wall // b. Echo 5/17: Mild LVH, EF 55-60%, normal wall motion, grade 1 diastolic dysfunction, trivial MR, severe LAE, mild RAE, PASP 42 mmHg  . History of nephrostomy 04/11/2015    currently inplace 04/28/2015   . History of nuclear stress test    a. Myoview 1/14 - Marked ischemia in the basal anterior, mid anterior, apical septal and apical inferior regions, EF 63% >> LHC normal   . Hyperlipidemia   .  Hypertension    medication removed from regimen due to low blood pressure   . Iron deficiency    hx  . Nephrolithiasis 2002, 2006   bilateral  . Normal coronary arteries 2014   a. LHC in 1/14: normal coronary arteries  . PAF (paroxysmal atrial fibrillation) ([place])    a. 10/ 2013  in setting of Septic Shock //  b. recurrent during admit for pneumonia, L effusion >> placed on Amiodarone // Coumadin for anticoagulation  . Pneumonia    dx 10-06-2014 per CXR--  on 10-27-2014 pt states finished antibiotic and denies cough or fever  . S/P hemodialysis catheter insertion ([place]) 04/11/2015    right anterior chest , only used once   . Sigmoid diverticulosis   . UTI (urinary tract infection) 05/10/2016    Past Surgical History:  Procedure Laterality Date  . ABDOMINAL HYSTERECTOMY    . AV FISTULA PLACEMENT Left 06/02/2015   Procedure: BRACHIOCEPHALIC ARTERIOVENOUS (AV) FISTULA CREATION ;  Surgeon: Conrad Tonasket, MD;  Location: [facility];  Service: Vascular;  Laterality: Left;  . BASCILIC VEIN TRANSPOSITION Left 07/27/2015   Procedure: FIRST STAGE BASILIC VEIN TRANSPOSITION LEFT UPPER ARM;  Surgeon: Conrad Defiance, MD;  Location: [facility];  Service: Vascular;  Laterality: Left;  . BASCILIC VEIN TRANSPOSITION Left 09/2015   second phase  .  BASCILIC VEIN TRANSPOSITION Left 10/12/2015   Procedure: SECOND STAGE BASILIC VEIN TRANSPOSITION LEFT ARM;  Surgeon: Conrad Fessenden, MD;  Location: [facility];  Service: Vascular;  Laterality: Left;  . BREAST BIOPSY Left 08/23/07   benign fibrocystic with duct ectasia  . CARDIAC CATHETERIZATION  07-11-2012  dr Irish Lack   Abnormal stress test/   normal coronary arteries/  LVEDP  70mmHg  . CARDIOVASCULAR STRESS TEST  06-26-2012  dr Irish Lack   marked ischemia in the basal anterior, mid anterior, apical inferior regions/  normal LVF, ef 63%  . CATARACT EXTRACTION W/ INTRAOCULAR LENS  IMPLANT, BILATERAL    . CYSTOSCOPY W/ URETERAL STENT PLACEMENT  04/04/2012   Procedure: CYSTOSCOPY WITH  RETROGRADE PYELOGRAM/URETERAL STENT PLACEMENT;  Surgeon: Ailene Rud, MD;  Location: [facility];  Service: Urology;  Laterality: Left;  . CYSTOSCOPY W/ URETERAL STENT PLACEMENT Bilateral 05/04/2015   Procedure: CYSTOSCOPY WITH BILATERAL RETROGRADE PYELOGRAM/ WITH INTERPRETATION, EXCHANGE OF RIGHT URETERAL STENT REPLACEMENT AND PLACEMENT LEFT URETERAL STENT PLACEMENT EXAMINATION OF VAGINA;  Surgeon: Carolan Clines, MD;  Location: [facility];  Service: Urology;  Laterality: Bilateral;  . CYSTOSCOPY WITH STENT PLACEMENT Right 10/28/2014   Procedure: RIGHT URETERAL STENT PLACEMENT;  Surgeon: Irine Seal, MD;  Location: [facility];  Service: Urology;  Laterality: Right;  . CYSTOSCOPY WITH STENT PLACEMENT Right 02/26/2015   Procedure: CYSTOSCOPY RETROGRADE PYELOGRAM WITH STENT PLACEMENT;  Surgeon: Cleon Gustin, MD;  Location: [facility];  Service: Urology;  Laterality: Right;  . CYSTOSCOPY/RETROGRADE/URETEROSCOPY/STONE EXTRACTION WITH BASKET Right 11/21/2014   Procedure: CYSTOSCOPY/RIGHT RETROGRADE PYELOGRAM/RIGHT URETEROSCOPY/BASKET EXTRACTION/RIGHT PYELOSCOPY/LASER OF STONE/RIGHT DOUBLE J STENT;  Surgeon: Carolan Clines, MD;  Location: [facility];  Service: Urology;  Laterality: Right;  . EXTRACORPOREAL SHOCK WAVE LITHOTRIPSY  05-28-2012  &  10-08-2012  . HOLMIUM LASER APPLICATION Right 07/23/7626   Procedure: HOLMIUM LASER APPLICATION;  Surgeon: Carolan Clines, MD;  Location: [facility];  Service: Urology;  Laterality: Right;  . IR GENERIC HISTORICAL  02/01/2016   IR NEPHROSTOMY EXCHANGE RIGHT 02/01/2016 Greggory Keen, MD [facility]  . IR GENERIC HISTORICAL  02/24/2016   IR PATIENT EVAL TECH 0-60 MINS 02/24/2016 Aletta Edouard, MD [facility]  . KNEE ARTHROSCOPY Left 02-14-2003  . LAPAROSCOPIC CHOLECYSTECTOMY  03-23-2005  . TOTAL ABDOMINAL HYSTERECTOMY W/ BILATERAL SALPINGOOPHORECTOMY  1993   secondary to fibroids  . TRANSTHORACIC ECHOCARDIOGRAM   04-09-2012   normal LVF,  ef 60-65%,  mild LAE,  mild TR, trivial MR and PR     reports that she has never smoked. She has never used smokeless tobacco. She reports that she does not drink alcohol or use drugs.  Allergies  Allergen Reactions  . Vicodin [Hydrocodone-Acetaminophen] Nausea And Vomiting  . Chlorhexidine Rash    Sunburn    rash  . Percocet [Oxycodone-Acetaminophen] Nausea And Vomiting    Family History  Problem Relation Age of Onset  . Hypertension Mother   . Cancer Mother 22    breast  . Dementia Mother   . Hypertension Brother   . Diabetes Brother   . Heart disease Brother     before age 71  . Cancer Father 87    pancreatic  . Heart failure Paternal Grandmother   . Bladder Cancer Maternal Grandfather       Prior to Admission medications   Medication Sig Start Date End Date Taking? Authorizing Provider  acetaminophen (TYLENOL) 500 MG tablet Take 1,000 mg by mouth every 6 (six) hours as needed for moderate  pain or headache.    Yes Historical Provider, MD  amiodarone (PACERONE) 200 MG tablet Take 1 tablet (200 mg total) by mouth daily. 12/07/15  Yes Scott T Weaver, PA-C  bimatoprost (LUMIGAN) 0.01 % SOLN Place 1 drop into both eyes at bedtime.   Yes Historical Provider, MD  calcium acetate (PHOSLO) 667 MG capsule Take 1,334 mg by mouth 3 (three) times daily with meals.    Yes Historical Provider, MD  cephALEXin (KEFLEX) 250 MG capsule Take 250 mg by mouth daily.   Yes Historical Provider, MD  Multiple Vitamins-Minerals (MULTIVITAMIN WITH MINERALS) tablet Take 1 tablet by mouth daily.   Yes Historical Provider, MD  ranitidine (ZANTAC) 150 MG tablet Take 150 mg by mouth 2 (two) times daily as needed for heartburn.   Yes Historical Provider, MD  saccharomyces boulardii (FLORASTOR) 250 MG capsule Take 1 capsule (250 mg total) by mouth 2 (two) times daily. 07/08/15  Yes Donne Hazel, MD  sodium chloride (OCEAN) 0.65 % SOLN nasal spray Place 1 spray into both nostrils 3  (three) times daily as needed for congestion.   Yes Historical Provider, MD  timolol (BETIMOL) 0.5 % ophthalmic solution Place 1 drop into both eyes every morning.    Yes Historical Provider, MD  warfarin (COUMADIN) 5 MG tablet Take as directed by [facility] Patient taking differently: Take 2.5-5 mg by mouth daily at 6 PM. TAKES 5MG  ON MON, WED AND FRI  TAKES 2.5MG  ALL OTHER DAYS 02/16/16  Yes Jettie Booze, MD    Physical Exam: Vitals:   07/02/16 0315 07/02/16 0330 07/02/16 0345 07/02/16 0400  BP: (!) 115/45 (!) 129/51 (!) 141/54 (!) 124/51  Pulse: 86 86 87 88  Resp: 16 18 17 18   Temp:      TempSrc:      SpO2: 95% 96% 100% 100%  Weight:    82.5 kg (181 lb 14.1 oz)  Height:    5\' 3"  (1.6 m)      Constitutional: NAD, calm, comfortable Eyes: PERRL, lids and conjunctivae normal ENMT: Mucous membranes are moist. Posterior pharynx clear of any exudate or lesions.Normal dentition.  Neck: normal, supple, no masses, no thyromegaly Respiratory: clear to auscultation bilaterally, no wheezing, no crackles. Normal respiratory effort. No accessory muscle use.  Cardiovascular: Regular rate and rhythm, no murmurs / rubs / gallops. No extremity edema. 2+ pedal pulses. No carotid bruits.  Abdomen: no tenderness, no masses palpated. No hepatosplenomegaly. Bowel sounds positive.  Musculoskeletal: no clubbing / cyanosis. No joint deformity upper and lower extremities. Good ROM, no contractures. Normal muscle tone.  Skin: no rashes, lesions, ulcers. No induration Neurologic: CN 2-12 grossly intact. Sensation intact, DTR normal. Strength 5/5 in all 4.  Psychiatric: Normal judgment and insight. Alert and oriented x 3. Normal mood.    Labs on Admission: I have personally reviewed following labs and imaging studies  CBC:  Recent Labs Lab 07/01/16 1946  WBC 9.4  NEUTROABS 8.4*  HGB 10.4*  HCT 32.7*  MCV 94.8  PLT 093   Basic Metabolic Panel:  Recent Labs Lab 07/01/16 1946  NA  136  K 3.9  CL 97*  CO2 25  GLUCOSE 101*  BUN 32*  CREATININE 5.34*  CALCIUM 8.7*   GFR: Estimated Creatinine Clearance: 9.8 mL/min (by C-G formula based on SCr of 5.34 mg/dL (H)). Liver Function Tests:  Recent Labs Lab 07/01/16 1946  AST 25  ALT 21  ALKPHOS 81  BILITOT 0.3  PROT 5.8*  ALBUMIN 3.1*  No results for input(s): LIPASE, AMYLASE in the last 168 hours. No results for input(s): AMMONIA in the last 168 hours. Coagulation Profile:  Recent Labs Lab 06/29/16 1124 07/02/16 0120  INR 1.8 1.63   Cardiac Enzymes: No results for input(s): CKTOTAL, CKMB, CKMBINDEX, TROPONINI in the last 168 hours. BNP (last 3 results) No results for input(s): PROBNP in the last 8760 hours. HbA1C: No results for input(s): HGBA1C in the last 72 hours. CBG: No results for input(s): GLUCAP in the last 168 hours. Lipid Profile: No results for input(s): CHOL, HDL, LDLCALC, TRIG, CHOLHDL, LDLDIRECT in the last 72 hours. Thyroid Function Tests: No results for input(s): TSH, T4TOTAL, FREET4, T3FREE, THYROIDAB in the last 72 hours. Anemia Panel: No results for input(s): VITAMINB12, FOLATE, FERRITIN, TIBC, IRON, RETICCTPCT in the last 72 hours. Urine analysis:    Component Value Date/Time   COLORURINE YELLOW 07/01/2016 1940   APPEARANCEUR CLOUDY (A) 07/01/2016 1940   LABSPEC 1.021 07/01/2016 1940   PHURINE 8.0 07/01/2016 1940   GLUCOSEU NEGATIVE 07/01/2016 1940   HGBUR NEGATIVE 07/01/2016 1940   BILIRUBINUR NEGATIVE 07/01/2016 1940   BILIRUBINUR neg 09/18/2014 0920   KETONESUR NEGATIVE 07/01/2016 1940   PROTEINUR NEGATIVE 07/01/2016 1940   UROBILINOGEN 0.2 05/02/2015 2020   NITRITE NEGATIVE 07/01/2016 1940   LEUKOCYTESUR NEGATIVE 07/01/2016 1940   Sepsis Labs: @LABRCNTIP (procalcitonin:4,lacticidven:4) )No results found for this or any previous visit (from the past 240 hour(s)).   Radiological Exams on Admission: Dg Chest 2 View  Result Date: 07/01/2016 CLINICAL DATA:   Patient started feeling bad at 6 p.m. today. Dry cough. Fever. EXAM: CHEST  2 VIEW COMPARISON:  05/24/2016. FINDINGS: Cardiomegaly. Hiatal hernia. There is LEFT lower lobe opacity consistent with early pneumonia, worse from priors. Mild vascular congestion. No significant effusion or pneumothorax. IMPRESSION: Cardiomegaly.  Early LEFT lower lobe pneumonia. Electronically Signed   By: Staci Righter M.D.   On: 07/01/2016 20:39    EKG: Independently reviewed.  Assessment/Plan Principal Problem:   CAP (community acquired pneumonia) Active Problems:   Essential hypertension, benign   End stage renal disease ([facility])   PAF (paroxysmal atrial fibrillation) ([facility])   Hypoxemia    1. CAP of LLL - 1. Rocephin, azithromycin, and will add vancomycin due to h/o MRSA+ nasal swab 2. PNA pathway 3. Cultures pending 4. O2 as needed 2. ESRD - 1. Left voicemail with nephrology consult line for routine dialysis while inpatient 2. No emergent needs for tonight 3. HTN - continue home meds 4. PAF - continue amiodarone, continue coumadin per pharm   DVT prophylaxis: coumadin Code Status: Full Family Communication: No family in room Consults called: Voicemail left for nephrology for dialysis Admission status: Admit to inpatient with new O2 requirement.   Etta Quill DO [facility] Pager [phone] from 7PM-7AM  If 7AM-7PM, please contact the day physician for the patient [email or web address] Password [email or web address]  07/02/2016, 4:09 AM

## 2016-07-02 NOTE — Progress Notes (Signed)
ANTICOAGULATION CONSULT NOTE - Initial Consult  Pharmacy Consult for Coumadin Indication: atrial fibrillation  Allergies  Allergen Reactions  . Vicodin [Hydrocodone-Acetaminophen] Nausea And Vomiting  . Chlorhexidine Rash    Sunburn    rash  . Percocet [Oxycodone-Acetaminophen] Nausea And Vomiting    Patient Measurements: Height: 5\' 3"  (160 cm) Weight: 181 lb 14.1 oz (82.5 kg) IBW/kg (Calculated) : 52.4  Vital Signs: Temp: 98.9 F (37.2 C) (01/12 2246) Temp Source: Oral (01/12 2246) BP: 124/51 (01/13 0400) Pulse Rate: 88 (01/13 0400)  Labs:  Recent Labs  06/29/16 1124 07/01/16 1946 07/02/16 0120  HGB  --  10.4*  --   HCT  --  32.7*  --   PLT  --  185  --   LABPROT  --   --  19.5*  INR 1.8  --  1.63  CREATININE  --  5.34*  --     Estimated Creatinine Clearance: 9.8 mL/min (by C-G formula based on SCr of 5.34 mg/dL (H)).   Medical History: Past Medical History:  Diagnosis Date  . Cardiomyopathy ([place])    a. h/o LV dysfunction EF 20-25% in 2013 due to sepsis.>> improved to normal   . Chronic diastolic CHF (congestive heart failure) ([place])    10/ 2013 in setting of septic shock  . Complication of anesthesia    use a little anesthesia , per patient MD states she quit breathing (2016); hard to wake up  . ESRD (end stage renal disease) ([place])    dialysis Tues, Thurs, Sat  . GERD (gastroesophageal reflux disease)   . Glaucoma   . Glaucoma   . H/O hiatal hernia    a. CT 2017: large gastric hiatal hernia.  Marland Kitchen Headache(784.0)    migraine  . History of blood transfusion 04/13/2015   . History of echocardiogram    a. Echo 6/17: EF 60-65%, normal wall motion, mild MR, atrial septal lipomatous hypertrophy, PASP 34 mmHg, possible trivial free-flowing pericardial effusion along RV free wall // b. Echo 5/17: Mild LVH, EF 55-60%, normal wall motion, grade 1 diastolic dysfunction, trivial MR, severe LAE, mild RAE, PASP 42 mmHg  . History of nephrostomy 04/11/2015    currently  inplace 04/28/2015   . History of nuclear stress test    a. Myoview 1/14 - Marked ischemia in the basal anterior, mid anterior, apical septal and apical inferior regions, EF 63% >> LHC normal   . Hyperlipidemia   . Hypertension    medication removed from regimen due to low blood pressure   . Iron deficiency    hx  . Nephrolithiasis 2002, 2006   bilateral  . Normal coronary arteries 2014   a. LHC in 1/14: normal coronary arteries  . PAF (paroxysmal atrial fibrillation) ([place])    a. 10/ 2013  in setting of Septic Shock //  b. recurrent during admit for pneumonia, L effusion >> placed on Amiodarone // Coumadin for anticoagulation  . Pneumonia    dx 10-06-2014 per CXR--  on 10-27-2014 pt states finished antibiotic and denies cough or fever  . S/P hemodialysis catheter insertion ([place]) 04/11/2015    right anterior chest , only used once   . Sigmoid diverticulosis   . UTI (urinary tract infection) 05/10/2016     Assessment: 71yo female to continue Coumadin for Afib during admission for PNA; current INR below goal w/ lower INR than last clinic visit 3d ago when pt was instructed to take a higher dose; last dose of Coumadin taken 1/12.  Goal of Therapy:  INR 2-3   Plan:  Will give boosted Coumadin dose of 7.5mg  x1 today and monitor INR for dose adjustments.  Wynona Neat, PharmD, BCPS  07/02/2016,4:13 AM

## 2016-07-02 NOTE — Consult Note (Signed)
[facility] Renal Consultation Note    Indication for Consultation:  Management of ESRD/hemodialysis; anemia, hypertension/volume and secondary hyperparathyroidism  HPI: [name] is a 71 y.o. female with ESRD on hemodialysis TTS, CM, chronic CHF,  PAF on Coumadin,  h/o recurrent UTI. Most recently hospital admit in 04/2016 with sepsis related to pneumonia vs UTI. She presented to ED yesterday evening with fever, chills, nausea. Reports fever with temp 1047F at home yesterday. Has been having a dry cough x 1 week, not improving.  ED course: BP 124/48 Pulse 92 Temp 102.47F Rsp 16 SpO2 94% Labs WBC 9.4 K 3.9 Hgb 10.4 CXR ?LLL opacity Admitted for further evaluation and treatment. Empiric antibiotics started. Blood and sputum cultures collected.  Currently she reports fatigue, cough, some SOB when walking to bathroom this morning, but feels better than yesterday. She denies chills, nausea, vomiting, chest pain or abdominal pain. She dialyzes at [facility], last HD was 1/11, compliant with HD, no recent issues noted.     Past Medical History:  Diagnosis Date  . Cardiomyopathy ([place])    a. h/o LV dysfunction EF 20-25% in 2013 due to sepsis.>> improved to normal   . Chronic diastolic CHF (congestive heart failure) ([place])    10/ 2013 in setting of septic shock  . Complication of anesthesia    use a little anesthesia , per patient MD states she quit breathing (2016); hard to wake up  . ESRD (end stage renal disease) ([place])    dialysis Tues, Thurs, Sat  . GERD (gastroesophageal reflux disease)   . Glaucoma   . Glaucoma   . H/O hiatal hernia    a. CT 2017: large gastric hiatal hernia.  Marland Kitchen Headache(784.0)    migraine  . History of blood transfusion 04/13/2015   . History of echocardiogram    a. Echo 6/17: EF 60-65%, normal wall motion, mild MR, atrial septal lipomatous hypertrophy, PASP 34 mmHg, possible trivial free-flowing pericardial effusion along RV free wall //  b. Echo 5/17: Mild LVH, EF 55-60%, normal wall motion, grade 1 diastolic dysfunction, trivial MR, severe LAE, mild RAE, PASP 42 mmHg  . History of nephrostomy 04/11/2015    currently inplace 04/28/2015   . History of nuclear stress test    a. Myoview 1/14 - Marked ischemia in the basal anterior, mid anterior, apical septal and apical inferior regions, EF 63% >> LHC normal   . Hyperlipidemia   . Hypertension    medication removed from regimen due to low blood pressure   . Iron deficiency    hx  . Nephrolithiasis 2002, 2006   bilateral  . Normal coronary arteries 2014   a. LHC in 1/14: normal coronary arteries  . PAF (paroxysmal atrial fibrillation) ([place])    a. 10/ 2013  in setting of Septic Shock //  b. recurrent during admit for pneumonia, L effusion >> placed on Amiodarone // Coumadin for anticoagulation  . Pneumonia    dx 10-06-2014 per CXR--  on 10-27-2014 pt states finished antibiotic and denies cough or fever  . S/P hemodialysis catheter insertion ([place]) 04/11/2015    right anterior chest , only used once   . Sigmoid diverticulosis   . UTI (urinary tract infection) 05/10/2016   Past Surgical History:  Procedure Laterality Date  . ABDOMINAL HYSTERECTOMY    . AV FISTULA PLACEMENT Left 06/02/2015   Procedure: BRACHIOCEPHALIC ARTERIOVENOUS (AV) FISTULA CREATION ;  Surgeon: Conrad Redwater, MD;  Location: [place];  Service: Vascular;  Laterality: Left;  . BASCILIC VEIN TRANSPOSITION Left 07/27/2015   Procedure: FIRST STAGE BASILIC VEIN TRANSPOSITION LEFT UPPER ARM;  Surgeon: Conrad Seneca, MD;  Location: [place];  Service: Vascular;  Laterality: Left;  . BASCILIC VEIN TRANSPOSITION Left 09/2015   second phase  . BASCILIC VEIN TRANSPOSITION Left 10/12/2015   Procedure: SECOND STAGE BASILIC VEIN TRANSPOSITION LEFT ARM;  Surgeon: Conrad Quanah, MD;  Location: [place];  Service: Vascular;  Laterality: Left;  . BREAST BIOPSY Left 08/23/07   benign fibrocystic with duct ectasia  . CARDIAC CATHETERIZATION   07-11-2012  dr Irish Lack   Abnormal stress test/   normal coronary arteries/  LVEDP  36mmHg  . CARDIOVASCULAR STRESS TEST  06-26-2012  dr Irish Lack   marked ischemia in the basal anterior, mid anterior, apical inferior regions/  normal LVF, ef 63%  . CATARACT EXTRACTION W/ INTRAOCULAR LENS  IMPLANT, BILATERAL    . CYSTOSCOPY W/ URETERAL STENT PLACEMENT  04/04/2012   Procedure: CYSTOSCOPY WITH RETROGRADE PYELOGRAM/URETERAL STENT PLACEMENT;  Surgeon: Ailene Rud, MD;  Location: [place];  Service: Urology;  Laterality: Left;  . CYSTOSCOPY W/ URETERAL STENT PLACEMENT Bilateral 05/04/2015   Procedure: CYSTOSCOPY WITH BILATERAL RETROGRADE PYELOGRAM/ WITH INTERPRETATION, EXCHANGE OF RIGHT URETERAL STENT REPLACEMENT AND PLACEMENT LEFT URETERAL STENT PLACEMENT EXAMINATION OF VAGINA;  Surgeon: Carolan Clines, MD;  Location: [facility];  Service: Urology;  Laterality: Bilateral;  . CYSTOSCOPY WITH STENT PLACEMENT Right 10/28/2014   Procedure: RIGHT URETERAL STENT PLACEMENT;  Surgeon: Irine Seal, MD;  Location: [facility];  Service: Urology;  Laterality: Right;  . CYSTOSCOPY WITH STENT PLACEMENT Right 02/26/2015   Procedure: CYSTOSCOPY RETROGRADE PYELOGRAM WITH STENT PLACEMENT;  Surgeon: Cleon Gustin, MD;  Location: [facility];  Service: Urology;  Laterality: Right;  . CYSTOSCOPY/RETROGRADE/URETEROSCOPY/STONE EXTRACTION WITH BASKET Right 11/21/2014   Procedure: CYSTOSCOPY/RIGHT RETROGRADE PYELOGRAM/RIGHT URETEROSCOPY/BASKET EXTRACTION/RIGHT PYELOSCOPY/LASER OF STONE/RIGHT DOUBLE J STENT;  Surgeon: Carolan Clines, MD;  Location: [place];  Service: Urology;  Laterality: Right;  . EXTRACORPOREAL SHOCK WAVE LITHOTRIPSY  05-28-2012  &  10-08-2012  . HOLMIUM LASER APPLICATION Right 07/25/2351   Procedure: HOLMIUM LASER APPLICATION;  Surgeon: Carolan Clines, MD;  Location: [facility];  Service: Urology;  Laterality: Right;  . IR GENERIC HISTORICAL  02/01/2016    IR NEPHROSTOMY EXCHANGE RIGHT 02/01/2016 Greggory Keen, MD [facility]  . IR GENERIC HISTORICAL  02/24/2016   IR PATIENT EVAL TECH 0-60 MINS 02/24/2016 Aletta Edouard, MD [facility]  . KNEE ARTHROSCOPY Left 02-14-2003  . LAPAROSCOPIC CHOLECYSTECTOMY  03-23-2005  . TOTAL ABDOMINAL HYSTERECTOMY W/ BILATERAL SALPINGOOPHORECTOMY  1993   secondary to fibroids  . TRANSTHORACIC ECHOCARDIOGRAM  04-09-2012   normal LVF,  ef 60-65%,  mild LAE,  mild TR, trivial MR and PR   Family History  Problem Relation Age of Onset  . Hypertension Mother   . Cancer Mother 38    breast  . Dementia Mother   . Hypertension Brother   . Diabetes Brother   . Heart disease Brother     before age 34  . Cancer Father 32    pancreatic  . Heart failure Paternal Grandmother   . Bladder Cancer Maternal Grandfather    Social History:  reports that she has never smoked. She has never used smokeless tobacco. She reports that she does not drink alcohol or use drugs. Allergies  Allergen Reactions  . Vicodin [Hydrocodone-Acetaminophen] Nausea And Vomiting  . Chlorhexidine Rash    Sunburn  rash  . Percocet [Oxycodone-Acetaminophen] Nausea And Vomiting   Prior to Admission medications   Medication Sig Start Date End Date Taking? Authorizing Provider  acetaminophen (TYLENOL) 500 MG tablet Take 1,000 mg by mouth every 6 (six) hours as needed for moderate pain or headache.    Yes Historical Provider, MD  amiodarone (PACERONE) 200 MG tablet Take 1 tablet (200 mg total) by mouth daily. 12/07/15  Yes Scott T Weaver, PA-C  bimatoprost (LUMIGAN) 0.01 % SOLN Place 1 drop into both eyes at bedtime.   Yes Historical Provider, MD  calcium acetate (PHOSLO) 667 MG capsule Take 1,334 mg by mouth 3 (three) times daily with meals.    Yes Historical Provider, MD  cephALEXin (KEFLEX) 250 MG capsule Take 250 mg by mouth daily.   Yes Historical Provider, MD  Multiple Vitamins-Minerals (MULTIVITAMIN WITH MINERALS) tablet Take 1 tablet by  mouth daily.   Yes Historical Provider, MD  ranitidine (ZANTAC) 150 MG tablet Take 150 mg by mouth 2 (two) times daily as needed for heartburn.   Yes Historical Provider, MD  saccharomyces boulardii (FLORASTOR) 250 MG capsule Take 1 capsule (250 mg total) by mouth 2 (two) times daily. 07/08/15  Yes Donne Hazel, MD  sodium chloride (OCEAN) 0.65 % SOLN nasal spray Place 1 spray into both nostrils 3 (three) times daily as needed for congestion.   Yes Historical Provider, MD  timolol (BETIMOL) 0.5 % ophthalmic solution Place 1 drop into both eyes every morning.    Yes Historical Provider, MD  warfarin (COUMADIN) 5 MG tablet Take as directed by [facility]mad[facility] Clinic Patient taking differently: Take 2.5-5 mg by mouth daily at 6 PM. TAKES 5MG  ON MON, WED AND FRI  TAKES 2.5MG  ALL OTHER DAYS 02/16/16  Yes Jettie Booze, MD   Current Facility-Administered Medications  Medication Dose Route Frequency Provider Last Rate Last Dose  . acetaminophen (TYLENOL) tablet 1,000 mg  1,000 mg Oral Q6H PRN Etta Quill, DO   1,000 mg at 07/02/16 6578  . amiodarone (PACERONE) tablet 200 mg  200 mg Oral Daily Etta Quill, DO   200 mg at 07/02/16 1024  . calcium acetate (PHOSLO) capsule 1,334 mg  1,334 mg Oral TID WC Etta Quill, DO   1,334 mg at 07/02/16 1024  . ceFEPIme (MAXIPIME) 2 g in dextrose 5 % 50 mL IVPB  2 g Intravenous Once per day on Sun Wed Fri Laren Everts, RPH   2 g at 07/02/16 1024  . famotidine (PEPCID) tablet 20 mg  20 mg Oral BID Etta Quill, DO   20 mg at 07/02/16 1024  . latanoprost (XALATAN) 0.005 % ophthalmic solution 1 drop  1 drop Both Eyes QHS Etta Quill, DO      . multivitamin with minerals tablet 1 tablet  1 tablet Oral Daily Etta Quill, DO   1 tablet at 07/02/16 1024  . saccharomyces boulardii (FLORASTOR) capsule 250 mg  250 mg Oral BID Etta Quill, DO   250 mg at 07/02/16 1024  . timolol (TIMOPTIC) 0.5 % ophthalmic solution 1 drop  1 drop Both Eyes q morning -  10a Etta Quill, DO      . vancomycin (VANCOCIN) IVPB 1000 mg/200 mL premix  1,000 mg Intravenous Q T,Th,Sa-HD Veronda P Bryk, RPH      . warfarin (COUMADIN) tablet 7.5 mg  7.5 mg Oral ONCE-1800 Veronda P Bryk, RPH      . Warfarin - Pharmacist Dosing Inpatient  Does not apply q1800 Laren Everts, [facility]       Labs: Basic Metabolic Panel:  Recent Labs Lab 07/01/16 1946  NA 136  K 3.9  CL 97*  CO2 25  GLUCOSE 101*  BUN 32*  CREATININE 5.34*  CALCIUM 8.7*   Liver Function Tests:  Recent Labs Lab 07/01/16 1946  AST 25  ALT 21  ALKPHOS 81  BILITOT 0.3  PROT 5.8*  ALBUMIN 3.1*   No results for input(s): LIPASE, AMYLASE in the last 168 hours. No results for input(s): AMMONIA in the last 168 hours. CBC:  Recent Labs Lab 07/01/16 1946  WBC 9.4  NEUTROABS 8.4*  HGB 10.4*  HCT 32.7*  MCV 94.8  PLT 185   Cardiac Enzymes: No results for input(s): CKTOTAL, CKMB, CKMBINDEX, TROPONINI in the last 168 hours. CBG: No results for input(s): GLUCAP in the last 168 hours. Iron Studies: No results for input(s): IRON, TIBC, TRANSFERRIN, FERRITIN in the last 72 hours. Studies/Results: Dg Chest 2 View  Result Date: 07/01/2016 CLINICAL DATA:  Patient started feeling bad at 6 p.m. today. Dry cough. Fever. EXAM: CHEST  2 VIEW COMPARISON:  05/24/2016. FINDINGS: Cardiomegaly. Hiatal hernia. There is LEFT lower lobe opacity consistent with early pneumonia, worse from priors. Mild vascular congestion. No significant effusion or pneumothorax. IMPRESSION: Cardiomegaly.  Early LEFT lower lobe pneumonia. Electronically Signed   By: Staci Righter M.D.   On: 07/01/2016 20:39    ROS: As per HPI otherwise negative.  Physical Exam: Vitals:   07/02/16 0400 07/02/16 0415 07/02/16 0504 07/02/16 0934  BP: (!) 124/51 (!) 140/50 (!) 130/48 (!) 96/51  Pulse: 88 85 92 83  Resp: 18 25 (!) 22 20  Temp:   98.1 F (36.7 C) 98.3 F (36.8 C)  TempSrc:   Oral Oral  SpO2: 100% 98% 100% 100%  Weight:  82.5 kg (181 lb 14.1 oz)  82.9 kg (182 lb 12.2 oz)   Height: 5\' 3"  (1.6 m)  5\' 3"  (1.6 m)      General: WDWN NAD Head: NCAT sclera not icteric MMM Neck: Supple.  Lungs: Breathing is unlabored slightly diminshed at bases  Heart: RRR with S1 S2.  Abdomen: soft NT + BS Lower extremities:without edema or ischemic changes, no open wounds  Neuro: A & O  X 3. Moves all extremities spontaneously. Psych:  Responds to questions appropriately with a normal affect. Dialysis Access: LUE AVF +thrill   Dialysis Orders:  [facility] TTS 4h 180 F BFR 400/800 2K/2Ca EDW 84.5 kg -Heparin bolus 2200 U IV q HD -Venofer 50 mg IV q wk No ESA No VDRA  Assessment/Plan: 1.  Fever/cough CXR with ? LLL infiltrate - per primary - afebrile overnight, on IV vanc/cefepime, blood/sputum cx pending  2.  ESRD -  TTS - cont on schedule  3.  Hypertension/volume  - BP controlled no BP meds/ below EDW by weights today - follow  4.  Anemia  - Hgb 10.4 - follow No OP ESA /  5.  Metabolic bone disease -  No VDRA/ Cont PhosLo binder  6.  Nutrition - renal diet/vitamins  7.  Afib - on amiodarone/Coumadin - managed by Coumadin Clinic   Lynnda Child PA-C [place] Pager [phone] 07/02/2016, 10:41 AM   Pt seen, examined and agree w A/P as above.  Kelly Splinter MD [employer] pager [phone]   07/02/2016, 2:13 PM

## 2016-07-02 NOTE — Progress Notes (Addendum)
Pharmacy Antibiotic Note  [name] is a 71 y.o. female admitted on 07/01/2016 with pneumonia.  Pharmacy has been consulted for vancomycin and cefepime dosing.  Plan: Vancomycin 1750mg  IV x1 followed by 1000mg  IV after each HD for goal vanc level 15-25. Cefepime 2g IV now and after each HD.   Height: 5\' 3"  (160 cm) Weight: 181 lb 14.1 oz (82.5 kg) IBW/kg (Calculated) : 52.4  Temp (24hrs), Avg:100.5 F (38.1 C), Min:98.9 F (37.2 C), Max:102.1 F (38.9 C)   Recent Labs Lab 07/01/16 1946 07/01/16 2007  WBC 9.4  --   CREATININE 5.34*  --   LATICACIDVEN  --  1.45    Estimated Creatinine Clearance: 9.8 mL/min (by C-G formula based on SCr of 5.34 mg/dL (H)).    Allergies  Allergen Reactions  . Vicodin [Hydrocodone-Acetaminophen] Nausea And Vomiting  . Chlorhexidine Rash    Sunburn    rash  . Percocet [Oxycodone-Acetaminophen] Nausea And Vomiting     Thank you for allowing pharmacy to be a part of this patient's care.  Wynona Neat, PharmD, BCPS  07/02/2016 4:11 AM

## 2016-07-02 NOTE — ED Notes (Signed)
Placed pt on 2 liters of o2 due to pt complaining of SOB

## 2016-07-03 DIAGNOSIS — I480 Paroxysmal atrial fibrillation: Secondary | ICD-10-CM

## 2016-07-03 DIAGNOSIS — N186 End stage renal disease: Secondary | ICD-10-CM

## 2016-07-03 LAB — RENAL FUNCTION PANEL
Albumin: 2.4 g/dL — ABNORMAL LOW (ref 3.5–5.0)
Anion gap: 8 (ref 5–15)
BUN: 22 mg/dL — ABNORMAL HIGH (ref 6–20)
CO2: 30 mmol/L (ref 22–32)
Calcium: 8.7 mg/dL — ABNORMAL LOW (ref 8.9–10.3)
Chloride: 100 mmol/L — ABNORMAL LOW (ref 101–111)
Creatinine, Ser: 4.36 mg/dL — ABNORMAL HIGH (ref 0.44–1.00)
GFR calc Af Amer: 11 mL/min — ABNORMAL LOW (ref >60)
GFR calc non Af Amer: 9 mL/min — ABNORMAL LOW (ref >60)
Glucose, Bld: 94 mg/dL (ref 65–99)
Phosphorus: 2.9 mg/dL (ref 2.5–4.6)
Potassium: 3.7 mmol/L (ref 3.5–5.1)
Sodium: 138 mmol/L (ref 135–145)

## 2016-07-03 LAB — URINALYSIS, ROUTINE W REFLEX MICROSCOPIC
Bilirubin Urine: NEGATIVE
Glucose, UA: NEGATIVE mg/dL
Ketones, ur: NEGATIVE mg/dL
Nitrite: NEGATIVE
Protein, ur: 100 mg/dL — AB
Specific Gravity, Urine: 1.008 (ref 1.005–1.030)
pH: 8 (ref 5.0–8.0)

## 2016-07-03 LAB — CBC
HCT: 29.7 % — ABNORMAL LOW (ref 36.0–46.0)
Hemoglobin: 9.6 g/dL — ABNORMAL LOW (ref 12.0–15.0)
MCH: 30.8 pg (ref 26.0–34.0)
MCHC: 32.3 g/dL (ref 30.0–36.0)
MCV: 95.2 fL (ref 78.0–100.0)
Platelets: 172 10*3/uL (ref 150–400)
RBC: 3.12 MIL/uL — ABNORMAL LOW (ref 3.87–5.11)
RDW: 15.9 % — ABNORMAL HIGH (ref 11.5–15.5)
WBC: 10.8 10*3/uL — ABNORMAL HIGH (ref 4.0–10.5)

## 2016-07-03 LAB — VANCOMYCIN, RANDOM: Vancomycin Rm: 33

## 2016-07-03 LAB — PROTIME-INR
INR: 2.18
Prothrombin Time: 24.7 seconds — ABNORMAL HIGH (ref 11.4–15.2)

## 2016-07-03 LAB — HEPATITIS B SURFACE ANTIGEN: Hepatitis B Surface Ag: NEGATIVE

## 2016-07-03 MED ORDER — WARFARIN SODIUM 2.5 MG PO TABS
2.5000 mg | ORAL_TABLET | Freq: Once | ORAL | Status: AC
  Administered 2016-07-03: 2.5 mg via ORAL
  Filled 2016-07-03: qty 1

## 2016-07-03 MED ORDER — ONDANSETRON HCL 4 MG/2ML IJ SOLN
4.0000 mg | Freq: Four times a day (QID) | INTRAMUSCULAR | Status: DC | PRN
  Administered 2016-07-03: 4 mg via INTRAVENOUS
  Filled 2016-07-03: qty 2

## 2016-07-03 MED ORDER — PROMETHAZINE HCL 25 MG/ML IJ SOLN
12.5000 mg | Freq: Four times a day (QID) | INTRAMUSCULAR | Status: DC | PRN
  Administered 2016-07-03: 12.5 mg via INTRAVENOUS
  Filled 2016-07-03: qty 1

## 2016-07-03 NOTE — Evaluation (Signed)
Physical Therapy Evaluation Patient Details Name: CHRISTAN CICCARELLI MRN: [id number] DOB: 08-Mar-1946 Today's Date: 07/03/2016   History of Present Illness  Patient is a 71 yo female admitted 07/01/16 with CAP, confusion, anemia.    PMH:  ESRD on HD, PAF, HTN, cardiomyopathy, CHF  Clinical Impression  Patient is functioning at Mod I level for all mobility and gait.  No loss of balance during gait today.  No further acute PT needs identified - PT will sign off.    Follow Up Recommendations No PT follow up;Supervision for mobility/OOB    Equipment Recommendations  None recommended by PT    Recommendations for Other Services       Precautions / Restrictions Precautions Precautions: None Restrictions Weight Bearing Restrictions: No      Mobility  Bed Mobility Overal bed mobility: Independent                Transfers Overall transfer level: Modified independent Equipment used: None             General transfer comment: Increased time.  Ambulation/Gait Ambulation/Gait assistance: Modified independent (Device/Increase time) Ambulation Distance (Feet): 80 Feet Assistive device: None Gait Pattern/deviations: Step-through pattern;Decreased stride length Gait velocity: decreased Gait velocity interpretation: Below normal speed for age/gender General Gait Details: Patient with good gait pattern and balance.  Patient reports she is walking slower than usual.    Per patient report, she has been getting up to bathroom on her own.  Stairs            Wheelchair Mobility    Modified Rankin (Stroke Patients Only)       Balance Overall balance assessment: No apparent balance deficits (not formally assessed)                                           Pertinent Vitals/Pain Pain Assessment: No/denies pain    Home Living Family/patient expects to be discharged to:: Private residence Living Arrangements: Children Available Help at Discharge:  Family;Available PRN/intermittently Type of Home: House Home Access: Stairs to enter Entrance Stairs-Rails: Psychiatric nurse of Steps: 3 Home Layout: One level Home Equipment: Walker - 2 wheels;Cane - single point      Prior Function Level of Independence: Independent         Comments: Patient drives, and takes herself to HD.  Active - walks the dog.   No h/o falls.     Hand Dominance        Extremity/Trunk Assessment   Upper Extremity Assessment Upper Extremity Assessment: Overall WFL for tasks assessed    Lower Extremity Assessment Lower Extremity Assessment: Overall WFL for tasks assessed       Communication   Communication: No difficulties  Cognition Arousal/Alertness: Awake/alert Behavior During Therapy: WFL for tasks assessed/performed Overall Cognitive Status: Within Functional Limits for tasks assessed                      General Comments      Exercises     Assessment/Plan    PT Assessment Patent does not need any further PT services  PT Problem List            PT Treatment Interventions      PT Goals (Current goals can be found in the Care Plan section)  Acute Rehab PT Goals PT Goal Formulation: All assessment and education complete, DC  therapy    Frequency     Barriers to discharge        Co-evaluation               End of Session Equipment Utilized During Treatment: Gait belt Activity Tolerance: Patient tolerated treatment well Patient left: in bed;with call bell/phone within reach (sitting EOB) Nurse Communication: Mobility status         Time: 1443-1540 PT Time Calculation (min) (ACUTE ONLY): 10 min   Charges:   PT Evaluation $PT Eval Low Complexity: 1 Procedure     PT G Codes:        Despina Pole 07/27/2016, 8:14 PM Carita Pian. Sanjuana Kava, [place] Pager [phone]

## 2016-07-03 NOTE — Progress Notes (Signed)
PROGRESS NOTE        PATIENT DETAILS Name: [name] Age: 71 y.o. Sex: female Date of Birth: May 20, 1946 Admit Date: 07/01/2016 Admitting Physician Etta Quill, DO OEH:[id number],IBB THOMAS, MD  Brief Narrative: Patient is a 71 y.o. female with past medical history of ESRD on HD-TTS, paroxysmal atrial fibrillation on anticoagulation admitted with fever, confusion, found to have PNA. See below for further details  Subjective: Cough better-but patient with nausea today. Had BM earlier today  Assessment/Plan: HCAP: Clinically improved, continue vancomycin and cefepime. Influenza PCR negative. Blood cultures negative so far.. Follow clinical course  Nausea:?cause-abd exam is benign-had BM earlier today-supportive care with anti-emetics-follow  ESRD: HD TTS-nephrology following.  Paroxysmal atrial fibrillation: Heart rate appears to be adequately controlled on amiodarone, INR therapeutic-pharmacy dosing Coumadin while inpatient  Anemia: Likely related to ESRD, defer to nephrology  DVT Prophylaxis: Full dose anticoagulation with Coumadin  Code Status: Full code   Family Communication: daughter at bedside  Disposition Plan: Remain inpatient-but will plan on Home health vs SNF on discharge  Antimicrobial agents: Anti-infectives    Start     Dose/Rate Route Frequency Ordered Stop   07/05/16 1200  vancomycin (VANCOCIN) IVPB 1000 mg/200 mL premix     1,000 mg 200 mL/hr over 60 Minutes Intravenous Every T-Th-Sa (Hemodialysis) 07/02/16 1128     07/02/16 2000  ceFEPIme (MAXIPIME) 1 g in dextrose 5 % 50 mL IVPB     1 g 100 mL/hr over 30 Minutes Intravenous Every 24 hours 07/02/16 1118     07/02/16 1200  vancomycin (VANCOCIN) IVPB 1000 mg/200 mL premix  Status:  Discontinued     1,000 mg 200 mL/hr over 60 Minutes Intravenous Every T-Th-Sa (Hemodialysis) 07/02/16 0410 07/02/16 1128   07/02/16 0630  ceFEPIme (MAXIPIME) 2 g in dextrose 5 % 50 mL IVPB   Status:  Discontinued     2 g 100 mL/hr over 30 Minutes Intravenous Once per day on Sun Wed Fri 07/02/16 0622 07/02/16 1118   07/02/16 0500  oseltamivir (TAMIFLU) capsule 30 mg  Status:  Discontinued     30 mg Oral Every T-Th-Sa (1800) 07/02/16 0406 07/02/16 0558   07/02/16 0415  vancomycin (VANCOCIN) 1,750 mg in sodium chloride 0.9 % 500 mL IVPB     1,750 mg 250 mL/hr over 120 Minutes Intravenous  Once 07/02/16 0410 07/02/16 0716   07/02/16 0200  vancomycin (VANCOCIN) IVPB 1000 mg/200 mL premix  Status:  Discontinued     1,000 mg 200 mL/hr over 60 Minutes Intravenous  Once 07/02/16 0158 07/02/16 0510   07/02/16 0200  azithromycin (ZITHROMAX) 500 mg in dextrose 5 % 250 mL IVPB     500 mg 250 mL/hr over 60 Minutes Intravenous  Once 07/02/16 0158 07/02/16 0510   07/02/16 0200  cefTRIAXone (ROCEPHIN) 1 g in dextrose 5 % 50 mL IVPB     1 g 100 mL/hr over 30 Minutes Intravenous  Once 07/02/16 0158 07/02/16 0407      Procedures: None  CONSULTS:  nephrology  Time spent: 25- minutes-Greater than 50% of this time was spent in counseling, explanation of diagnosis, planning of further management, and coordination of care.  MEDICATIONS: Scheduled Meds: . amiodarone  200 mg Oral Daily  . calcium acetate  1,334 mg Oral TID WC  . ceFEPime (MAXIPIME) IV  1 g Intravenous Q24H  .  famotidine  20 mg Oral Daily  . latanoprost  1 drop Both Eyes QHS  . multivitamin with minerals  1 tablet Oral Daily  . saccharomyces boulardii  250 mg Oral BID  . timolol  1 drop Both Eyes q morning - 10a  . [START ON 07/05/2016] vancomycin  1,000 mg Intravenous Q T,Th,Sa-HD  . warfarin  2.5 mg Oral ONCE-1800  . Warfarin - Pharmacist Dosing Inpatient   Does not apply q1800   Continuous Infusions: PRN Meds:.sodium chloride, sodium chloride, acetaminophen, alteplase, heparin, heparin, lidocaine (PF), lidocaine-prilocaine, ondansetron (ZOFRAN) IV, pentafluoroprop-tetrafluoroeth, promethazine   PHYSICAL EXAM: Vital  signs: Vitals:   07/02/16 1828 07/02/16 2159 07/03/16 0531 07/03/16 0920  BP: (!) 143/51 (!) 106/41 (!) 115/47 (!) 102/45  Pulse: 87 81 72 68  Resp: 18 20 16 16   Temp: 98.8 F (37.1 C) 98.5 F (36.9 C) 97.7 F (36.5 C) 98 F (36.7 C)  TempSrc: Oral Oral Oral Oral  SpO2: 99% 96% 100% 97%  Weight: 84.2 kg (185 lb 10 oz) 84.1 kg (185 lb 8 oz)    Height:       Filed Weights   07/02/16 1500 07/02/16 1828 07/02/16 2159  Weight: 85.7 kg (188 lb 15 oz) 84.2 kg (185 lb 10 oz) 84.1 kg (185 lb 8 oz)   Body mass index is 32.86 kg/m.   General appearance :Awake, alert, not in any distress. Speech Clear. Not toxic Looking Eyes:, pupils equally reactive to light and accomodation,no scleral icterus.Pink conjunctiva HEENT: Atraumatic and Normocephalic Neck: supple, no JVD. No cervical lymphadenopathy. No thyromegaly Resp:Good air entry bilaterally, no added sounds  CVS: S1 S2 regular, no murmurs.  GI: Bowel sounds present, Non tender and not distended with no gaurding, rigidity or rebound.No organomegaly Extremities: B/L Lower Ext shows no edema, both legs are warm to touch Neurology:  speech clear,Non focal, sensation is grossly intact. Psychiatric: Normal judgment and insight. Alert and oriented x 3. Normal mood. Musculoskeletal:No digital cyanosis Skin:No Rash, warm and dry Wounds:N/A  I have personally reviewed following labs and imaging studies  LABORATORY DATA: CBC:  Recent Labs Lab 07/01/16 1946 07/03/16 0606  WBC 9.4 10.8*  NEUTROABS 8.4*  --   HGB 10.4* 9.6*  HCT 32.7* 29.7*  MCV 94.8 95.2  PLT 185 672    Basic Metabolic Panel:  Recent Labs Lab 07/01/16 1946 07/02/16 0530 07/03/16 0606  NA 136 134* 138  K 3.9 3.8 3.7  CL 97* 96* 100*  CO2 25 25 30   GLUCOSE 101* 116* 94  BUN 32* 35* 22*  CREATININE 5.34* 5.72* 4.36*  CALCIUM 8.7* 8.1* 8.7*  PHOS  --  2.7 2.9    GFR: Estimated Creatinine Clearance: 12.2 mL/min (by C-G formula based on SCr of 4.36 mg/dL  (H)).  Liver Function Tests:  Recent Labs Lab 07/01/16 1946 07/02/16 0530 07/03/16 0606  AST 25  --   --   ALT 21  --   --   ALKPHOS 81  --   --   BILITOT 0.3  --   --   PROT 5.8*  --   --   ALBUMIN 3.1* 2.8* 2.4*   No results for input(s): LIPASE, AMYLASE in the last 168 hours. No results for input(s): AMMONIA in the last 168 hours.  Coagulation Profile:  Recent Labs Lab 06/29/16 1124 07/02/16 0120 07/03/16 0606  INR 1.8 1.63 2.18    Cardiac Enzymes: No results for input(s): CKTOTAL, CKMB, CKMBINDEX, TROPONINI in the last 168 hours.  BNP (last 3 results) No results for input(s): PROBNP in the last 8760 hours.  HbA1C: No results for input(s): HGBA1C in the last 72 hours.  CBG: No results for input(s): GLUCAP in the last 168 hours.  Lipid Profile: No results for input(s): CHOL, HDL, LDLCALC, TRIG, CHOLHDL, LDLDIRECT in the last 72 hours.  Thyroid Function Tests: No results for input(s): TSH, T4TOTAL, FREET4, T3FREE, THYROIDAB in the last 72 hours.  Anemia Panel: No results for input(s): VITAMINB12, FOLATE, FERRITIN, TIBC, IRON, RETICCTPCT in the last 72 hours.  Urine analysis:    Component Value Date/Time   COLORURINE YELLOW 07/03/2016 0030   APPEARANCEUR TURBID (A) 07/03/2016 0030   LABSPEC 1.008 07/03/2016 0030   PHURINE 8.0 07/03/2016 0030   GLUCOSEU NEGATIVE 07/03/2016 0030   HGBUR MODERATE (A) 07/03/2016 0030   BILIRUBINUR NEGATIVE 07/03/2016 0030   BILIRUBINUR neg 09/18/2014 0920   KETONESUR NEGATIVE 07/03/2016 0030   PROTEINUR 100 (A) 07/03/2016 0030   UROBILINOGEN 0.2 05/02/2015 2020   NITRITE NEGATIVE 07/03/2016 0030   LEUKOCYTESUR LARGE (A) 07/03/2016 0030    Sepsis Labs: Lactic Acid, Venous    Component Value Date/Time   LATICACIDVEN 1.45 07/01/2016 2007    MICROBIOLOGY: Recent Results (from the past 240 hour(s))  Culture, blood (Routine X 2) w Reflex to ID Panel     Status: None (Preliminary result)   Collection Time: 07/02/16   2:50 AM  Result Value Ref Range Status   Specimen Description BLOOD RIGHT ARM  Final   Special Requests BOTTLES DRAWN AEROBIC AND ANAEROBIC 5ML  Final   Culture NO GROWTH 1 DAY  Final   Report Status PENDING  Incomplete  Culture, blood (Routine X 2) w Reflex to ID Panel     Status: None (Preliminary result)   Collection Time: 07/02/16  3:00 AM  Result Value Ref Range Status   Specimen Description BLOOD RIGHT HAND  Final   Special Requests BOTTLES DRAWN AEROBIC AND ANAEROBIC 5ML  Final   Culture NO GROWTH 1 DAY  Final   Report Status PENDING  Incomplete  Culture, blood (routine x 2) Call MD if unable to obtain prior to antibiotics being given     Status: None (Preliminary result)   Collection Time: 07/02/16  5:36 AM  Result Value Ref Range Status   Specimen Description BLOOD RIGHT HAND  Final   Special Requests BOTTLES DRAWN AEROBIC AND ANAEROBIC 10CC EA  Final   Culture NO GROWTH 1 DAY  Final   Report Status PENDING  Incomplete  Culture, blood (routine x 2) Call MD if unable to obtain prior to antibiotics being given     Status: None (Preliminary result)   Collection Time: 07/02/16  5:48 AM  Result Value Ref Range Status   Specimen Description BLOOD RIGHT WRIST  Final   Special Requests BOTTLES DRAWN AEROBIC AND ANAEROBIC 5CC EA  Final   Culture NO GROWTH 1 DAY  Final   Report Status PENDING  Incomplete  MRSA PCR Screening     Status: None   Collection Time: 07/02/16  5:53 AM  Result Value Ref Range Status   MRSA by PCR NEGATIVE NEGATIVE Final    Comment:        The GeneXpert MRSA Assay (FDA approved for NASAL specimens only), is one component of a comprehensive MRSA colonization surveillance program. It is not intended to diagnose MRSA infection nor to guide or monitor treatment for MRSA infections.     RADIOLOGY STUDIES/RESULTS: Dg Chest 2 View  Result Date: 07/01/2016 CLINICAL DATA:  Patient started feeling bad at 6 p.m. today. Dry cough. Fever. EXAM: CHEST  2 VIEW  COMPARISON:  05/24/2016. FINDINGS: Cardiomegaly. Hiatal hernia. There is LEFT lower lobe opacity consistent with early pneumonia, worse from priors. Mild vascular congestion. No significant effusion or pneumothorax. IMPRESSION: Cardiomegaly.  Early LEFT lower lobe pneumonia. Electronically Signed   By: Staci Righter M.D.   On: 07/01/2016 20:39     LOS: 1 day   Oren Binet, MD  [facility] Pager:[phone]  If 7PM-7AM, please contact night-coverage [email or web address] Password [email or web address] 07/03/2016, 3:31 PM

## 2016-07-03 NOTE — Progress Notes (Signed)
Pharmacy Antibiotic Note  [name] is a 71 y.o. female admitted on 07/01/2016 with pneumonia.  Pharmacy has been consulted for vancomycin and cefepime dosing. ESRD on HD TTS. WBC wnl, afebrile.   Patient received two doses of vancomycin pre-HD on 1/13. Due to high dose vancomycin pre-HD, vancomycin dose held on 1/13. Vancomycin random elevated this AM at 33 mcg/mL. Would consider obtaining additional level on 1/16 in AM to guide dosing.   Plan: Vancomycin 1000mg  IV qHD TuThSat to begin Tuesday 1/16.  Cefepime 1 g q24hr (dosed at 8PM) Monitor clinical picture, culture results, HD schedule, and LOT Vancomycin levels as indicated - consider 1/16 with AM labs  Height: 5\' 3"  (160 cm) Weight: 185 lb 8 oz (84.1 kg) IBW/kg (Calculated) : 52.4  Temp (24hrs), Avg:98.3 F (36.8 C), Min:97.7 F (36.5 C), Max:98.8 F (37.1 C)   Recent Labs Lab 07/01/16 1946 07/01/16 2007 07/02/16 0530 07/03/16 0606  WBC 9.4  --   --  10.8*  CREATININE 5.34*  --  5.72* 4.36*  LATICACIDVEN  --  1.45  --   --   VANCORANDOM  --   --   --  33    Estimated Creatinine Clearance: 12.2 mL/min (by C-G formula based on SCr of 4.36 mg/dL (H)).    Allergies  Allergen Reactions  . Vicodin [Hydrocodone-Acetaminophen] Nausea And Vomiting  . Chlorhexidine Rash    Sunburn    rash  . Percocet [Oxycodone-Acetaminophen] Nausea And Vomiting   Antimicrobials this admission:  1/13 vanc >>  1/13 cefepime >>  1/13 Azith x1 1/13 CTX x1  Dose adjustments this admission:  Cefepime 2g x1 given pre-HD (for HD 1/13) >> changed to cefepime 1g q24h at 2000 daily starting 1/13 Vancomycin 1g + 1750mg  given pre-HD, NO vanc ordered for 1/13 HD session   Microbiology results:  1/13 blood cx x2: sent 1/13 MRSA pcr: neg  1/13 flu A: neg   Argie Ramming, PharmD Pharmacy Resident  Pager [phone] 07/03/16 8:40 AM

## 2016-07-03 NOTE — Progress Notes (Signed)
[name] Progress Note  Dialysis Orders:  [facility] TTS 4h 180 F BFR 400/800 2K/2Ca EDW 84.5 kg -Heparin bolus 2200 U IV q HD -Venofer 50 mg IV q wk No ESA No VDRA  Assessment/Plan: 1.  Fever/cough CXR with ? LLL infiltrate - per primary - afebrile overnight, on IV vanc/cefepime, blood/sputum cx ngtd 2.  ESRD -  TTS - cont on schedule  3.  Hypertension/volume  - BP controlled no BP meds/ no volume excess on exam - below EDW  4.  Anemia  - Hgb 9.6 - follow No OP ESA may need to resume  5.  Metabolic bone disease -  No VDRA/ Cont PhosLo binder  6.  Nutrition - renal diet/vitamins  7.  Afib - on amiodarone/Coumadin - managed by Coumadin Clinic as OP   Lynnda Child PA-C [place] Pager [phone] 07/03/2016,1:25 PM  LOS: 1 day   Pt seen, examined and agree w A/P as above.  Kelly Splinter MD [facility] pager [phone]   07/03/2016, 2:55 PM    Subjective: Sleepy, felt nauseated - meds helping,  no vomiting, cough improving   Objective Vitals:   07/02/16 1828 07/02/16 2159 07/03/16 0531 07/03/16 0920  BP: (!) 143/51 (!) 106/41 (!) 115/47 (!) 102/45  Pulse: 87 81 72 68  Resp: 18 20 16 16   Temp: 98.8 F (37.1 C) 98.5 F (36.9 C) 97.7 F (36.5 C) 98 F (36.7 C)  TempSrc: Oral Oral Oral Oral  SpO2: 99% 96% 100% 97%  Weight: 84.2 kg (185 lb 10 oz) 84.1 kg (185 lb 8 oz)    Height:       Physical Exam General: WN Elderly female NAD Heart: RRR Lungs: Breathing unlabored, diminished at bases but improved over 1/13 exam  Abdomen: soft NT Extremities: no LE edema  Dialysis Access: LUE AVF +thrill   Additional Objective Labs: Basic Metabolic Panel:  Recent Labs Lab 07/01/16 1946 07/02/16 0530 07/03/16 0606  NA 136 134* 138  K 3.9 3.8 3.7  CL 97* 96* 100*  CO2 25 25 30   GLUCOSE 101* 116* 94  BUN 32* 35* 22*  CREATININE 5.34* 5.72* 4.36*  CALCIUM 8.7* 8.1* 8.7*  PHOS  --  2.7 2.9   Liver Function  Tests:  Recent Labs Lab 07/01/16 1946 07/02/16 0530 07/03/16 0606  AST 25  --   --   ALT 21  --   --   ALKPHOS 81  --   --   BILITOT 0.3  --   --   PROT 5.8*  --   --   ALBUMIN 3.1* 2.8* 2.4*   No results for input(s): LIPASE, AMYLASE in the last 168 hours. CBC:  Recent Labs Lab 07/01/16 1946 07/03/16 0606  WBC 9.4 10.8*  NEUTROABS 8.4*  --   HGB 10.4* 9.6*  HCT 32.7* 29.7*  MCV 94.8 95.2  PLT 185 172   Blood Culture    Component Value Date/Time   SDES BLOOD RIGHT WRIST 07/02/2016 0548   SPECREQUEST BOTTLES DRAWN AEROBIC AND ANAEROBIC 5CC EA 07/02/2016 0548   CULT NO GROWTH 1 DAY 07/02/2016 0548   REPTSTATUS PENDING 07/02/2016 0548    Cardiac Enzymes: No results for input(s): CKTOTAL, CKMB, CKMBINDEX, TROPONINI in the last 168 hours. CBG: No results for input(s): GLUCAP in the last 168 hours. Iron Studies: No results for input(s): IRON, TIBC, TRANSFERRIN, FERRITIN in the last 72 hours. Lab Results  Component Value Date   INR 2.18 07/03/2016  INR 1.63 07/02/2016   INR 1.8 06/29/2016   Medications:  . amiodarone  200 mg Oral Daily  . calcium acetate  1,334 mg Oral TID WC  . ceFEPime (MAXIPIME) IV  1 g Intravenous Q24H  . famotidine  20 mg Oral Daily  . latanoprost  1 drop Both Eyes QHS  . multivitamin with minerals  1 tablet Oral Daily  . saccharomyces boulardii  250 mg Oral BID  . timolol  1 drop Both Eyes q morning - 10a  . [START ON 07/05/2016] vancomycin  1,000 mg Intravenous Q T,Th,Sa-HD  . warfarin  2.5 mg Oral ONCE-1800  . Warfarin - Pharmacist Dosing Inpatient   Does not apply [phone]

## 2016-07-03 NOTE — Progress Notes (Signed)
ANTICOAGULATION CONSULT NOTE - Initial Consult  Pharmacy Consult for Coumadin Indication: atrial fibrillation  Allergies  Allergen Reactions  . Vicodin [Hydrocodone-Acetaminophen] Nausea And Vomiting  . Chlorhexidine Rash    Sunburn    rash  . Percocet [Oxycodone-Acetaminophen] Nausea And Vomiting    Patient Measurements: Height: 5\' 3"  (160 cm) Weight: 185 lb 8 oz (84.1 kg) IBW/kg (Calculated) : 52.4  Vital Signs: Temp: 97.7 F (36.5 C) (01/14 0531) Temp Source: Oral (01/14 0531) BP: 115/47 (01/14 0531) Pulse Rate: 72 (01/14 0531)  Labs:  Recent Labs  07/01/16 1946 07/02/16 0120 07/02/16 0530 07/03/16 0606  HGB 10.4*  --   --  9.6*  HCT 32.7*  --   --  29.7*  PLT 185  --   --  172  LABPROT  --  19.5*  --  24.7*  INR  --  1.63  --  2.18  CREATININE 5.34*  --  5.72* 4.36*    Estimated Creatinine Clearance: 12.2 mL/min (by C-G formula based on SCr of 4.36 mg/dL (H)).   Medical History: Past Medical History:  Diagnosis Date  . Cardiomyopathy ([place])    a. h/o LV dysfunction EF 20-25% in 2013 due to sepsis.>> improved to normal   . Chronic diastolic CHF (congestive heart failure) ([place])    10/ 2013 in setting of septic shock  . Complication of anesthesia    use a little anesthesia , per patient MD states she quit breathing (2016); hard to wake up  . ESRD (end stage renal disease) ([place])    dialysis Tues, Thurs, Sat  . GERD (gastroesophageal reflux disease)   . Glaucoma   . Glaucoma   . H/O hiatal hernia    a. CT 2017: large gastric hiatal hernia.  Marland Kitchen Headache(784.0)    migraine  . History of blood transfusion 04/13/2015   . History of echocardiogram    a. Echo 6/17: EF 60-65%, normal wall motion, mild MR, atrial septal lipomatous hypertrophy, PASP 34 mmHg, possible trivial free-flowing pericardial effusion along RV free wall // b. Echo 5/17: Mild LVH, EF 55-60%, normal wall motion, grade 1 diastolic dysfunction, trivial MR, severe LAE, mild RAE, PASP 42 mmHg  .  History of nephrostomy 04/11/2015    currently inplace 04/28/2015   . History of nuclear stress test    a. Myoview 1/14 - Marked ischemia in the basal anterior, mid anterior, apical septal and apical inferior regions, EF 63% >> LHC normal   . Hyperlipidemia   . Hypertension    medication removed from regimen due to low blood pressure   . Iron deficiency    hx  . Nephrolithiasis 2002, 2006   bilateral  . Normal coronary arteries 2014   a. LHC in 1/14: normal coronary arteries  . PAF (paroxysmal atrial fibrillation) ([place])    a. 10/ 2013  in setting of Septic Shock //  b. recurrent during admit for pneumonia, L effusion >> placed on Amiodarone // Coumadin for anticoagulation  . Pneumonia    dx 10-06-2014 per CXR--  on 10-27-2014 pt states finished antibiotic and denies cough or fever  . S/P hemodialysis catheter insertion ([place]) 04/11/2015    right anterior chest , only used once   . Sigmoid diverticulosis   . UTI (urinary tract infection) 05/10/2016    Assessment: 71yo female to continue warfarin for Afib during admission for PNA. INR therapeutic (2.18) today after a one-time dose of 7.5mg . CBC stable and no s/s bleeding noted.   Per last [facility]  visit: warfarin 5mg  on MWFSat and 2.5mg  on TuThSun  Goal of Therapy:  INR 2-3   Plan:  Coumadin dose of 2.5 mg x1 today  Daily INR, CBC prn  Monitor for s/s bleeding   Argie Ramming, PharmD Pharmacy Resident  Pager [phone] 07/03/16 8:47 AM

## 2016-07-03 NOTE — Discharge Instructions (Signed)

## 2016-07-03 NOTE — Progress Notes (Signed)
Urine sent to lab for U/A.

## 2016-07-04 LAB — PROTIME-INR
INR: 2.82
Prothrombin Time: 30.3 seconds — ABNORMAL HIGH (ref 11.4–15.2)

## 2016-07-04 MED ORDER — LEVOFLOXACIN 750 MG PO TABS
750.0000 mg | ORAL_TABLET | Freq: Once | ORAL | Status: AC
  Administered 2016-07-04: 750 mg via ORAL
  Filled 2016-07-04: qty 1

## 2016-07-04 MED ORDER — LEVOFLOXACIN 500 MG PO TABS: 500.0000 mg | ORAL_TABLET | ORAL | Status: DC

## 2016-07-04 MED ORDER — LEVOFLOXACIN 500 MG PO TABS: 500.0000 mg | ORAL_TABLET | ORAL | 0 refills | Status: DC

## 2016-07-04 MED ORDER — LEVOFLOXACIN IN D5W 750 MG/150ML IV SOLN: 750.0000 mg | INTRAVENOUS | Status: DC

## 2016-07-04 MED ORDER — WARFARIN SODIUM 5 MG PO TABS: ORAL_TABLET | ORAL | 3 refills | Status: DC

## 2016-07-04 MED ORDER — WARFARIN SODIUM 1 MG PO TABS
1.0000 mg | ORAL_TABLET | Freq: Once | ORAL | Status: DC
  Filled 2016-07-04: qty 1

## 2016-07-04 NOTE — Progress Notes (Signed)
Subjective:  No cos / last HD  Sat / for dc today   Objective Vital signs in last 24 hours: Vitals:   07/03/16 0920 07/03/16 1716 07/03/16 2214 07/04/16 0603  BP: (!) 102/45 104/88 (!) 114/56 (!) 111/54  Pulse: 68 65 72 67  Resp: 16 18 16 16   Temp: 98 F (36.7 C) 98.3 F (36.8 C) 97.9 F (36.6 C) 98.3 F (36.8 C)  TempSrc: Oral Oral Oral Oral  SpO2: 97% 98% 97% 98%  Weight:      Height:       Weight change:   Physical Exam General: WN  WF Elderly appears younger than age /  NAD Heart: RRR Lungs:  CTA  BILAT Breathing unlabored,  Abdomen: soft NT Extremities: no LE edema  Dialysis Access: LUE AVF +thrill    [facility] TTS 4h 180 F BFR 400/800 2K/2Ca EDW 84.5 kg -Heparin bolus 2200 U IV q HD -Venofer 50 mg IV q wk No ESA No VDRA  Assessment/Plan: 1. Fever/cough CXR with ? LLL infiltrate- per primary - on IV vanc/cefepime, blood/sputum cx ngtd 2. ESRD- TTS - cont on schedule  3. Hypertension/volume- BP controlled no BP meds/ no volume excess on exam - below EDW slightly  With 84 kg new edw 4. Anemia- Hgb 9.6 -  No OP ESA need to resume at [place]. Metabolic bone disease- No VDRA/ Cont PhosLo binder  6. Nutrition- renal diet/vitamins  7. Afib - on amiodarone/Coumadin - managed by Coumadin Clinic as OP   Ernest Haber, PA-C [facility] [phone] 07/04/2016,8:04 AM  LOS: 2 days   Labs: Basic Metabolic Panel:  Recent Labs Lab 07/01/16 1946 07/02/16 0530 07/03/16 0606  NA 136 134* 138  K 3.9 3.8 3.7  CL 97* 96* 100*  CO2 25 25 30   GLUCOSE 101* 116* 94  BUN 32* 35* 22*  CREATININE 5.34* 5.72* 4.36*  CALCIUM 8.7* 8.1* 8.7*  PHOS  --  2.7 2.9   Liver Function Tests:  Recent Labs Lab 07/01/16 1946 07/02/16 0530 07/03/16 0606  AST 25  --   --   ALT 21  --   --   ALKPHOS 81  --   --   BILITOT 0.3  --   --   PROT 5.8*  --   --   ALBUMIN 3.1* 2.8* 2.4*   No results for input(s): LIPASE, AMYLASE in the last 168  hours. No results for input(s): AMMONIA in the last 168 hours. CBC:  Recent Labs Lab 07/01/16 1946 07/03/16 0606  WBC 9.4 10.8*  NEUTROABS 8.4*  --   HGB 10.4* 9.6*  HCT 32.7* 29.7*  MCV 94.8 95.2  PLT 185 172   Cardiac Enzymes: No results for input(s): CKTOTAL, CKMB, CKMBINDEX, TROPONINI in the last 168 hours. CBG: No results for input(s): GLUCAP in the last 168 hours.  Studies/Results: No results found. Medications:  . amiodarone  200 mg Oral Daily  . calcium acetate  1,334 mg Oral TID WC  . ceFEPime (MAXIPIME) IV  1 g Intravenous Q24H  . famotidine  20 mg Oral Daily  . latanoprost  1 drop Both Eyes QHS  . multivitamin with minerals  1 tablet Oral Daily  . saccharomyces boulardii  250 mg Oral BID  . timolol  1 drop Both Eyes q morning - 10a  . [START ON 07/05/2016] vancomycin  1,000 mg Intravenous Q T,Th,Sa-HD  . Warfarin - Pharmacist Dosing Inpatient   Does not apply [phone]

## 2016-07-04 NOTE — Progress Notes (Signed)
ANTICOAGULATION CONSULT NOTE - Follow-Up  Pharmacy Consult for Coumadin Indication: atrial fibrillation  Patient Measurements: Height: 5\' 3"  (160 cm) Weight: 185 lb 8 oz (84.1 kg) IBW/kg (Calculated) : 52.4  Labs:  Recent Labs  07/01/16 1946 07/02/16 0120 07/02/16 0530 07/03/16 0606 07/04/16 0518  HGB 10.4*  --   --  9.6*  --   HCT 32.7*  --   --  29.7*  --   PLT 185  --   --  172  --   LABPROT  --  19.5*  --  24.7* 30.3*  INR  --  1.63  --  2.18 2.82  CREATININE 5.34*  --  5.72* 4.36*  --     Estimated Creatinine Clearance: 12.2 mL/min (by C-G formula based on SCr of 4.36 mg/dL (H)).  Assessment: 51 YOF who was continued on warfarin from PTA for hx Afib.   INR today remains therapeutic and continues to rise despite a dose reduction yesterday (INR 2.82 << 2.18, goal of 2-3). The patient was also noted to start Levaquin today which will increase warfarin sensitivity. No CBC today - no overt s/sx of bleeding noted.   While on Levaquin - would recommend the patient only take 2.5 mg/day until the next INR check given the interaction.   Per last [facility] visit: warfarin 5mg  on MWFSat and 2.5mg  on TuThSun  Goal of Therapy:  INR 2-3   Plan:  1. Warfarin 1 mg x 1 dose at 1800 today (if still here) 2. Will continue to monitor for any signs/symptoms of bleeding and will follow up with PT/INR in the a.m. (if still here)  Thank you for allowing pharmacy to be a part of this patient's care.  Alycia Rossetti, PharmD, BCPS Clinical Pharmacist Pager: [phone] Clinical phone for 07/04/2016 from 7a-3:30p: [phone] If after 3:30p, please call main pharmacy at: [phone] 07/04/2016 11:10 AM

## 2016-07-04 NOTE — Progress Notes (Signed)
Pharmacy Antibiotic Note  [name] is a 71 y.o. female admitted on 07/01/2016 with pneumonia.  Pharmacy has been consulted to transition the patient from Vancomycin + Cefepime to Levaquin for continued PNA coverage. ESRD-TTS  Plan: 1. Levaquin 750 mg po x 1 dose followed by 500 mg po every 48 hours 2. Pharmacy will sign off as no further dose adjustments are expected  Height: 5\' 3"  (160 cm) Weight: 185 lb 8 oz (84.1 kg) IBW/kg (Calculated) : 52.4  Temp (24hrs), Avg:98.1 F (36.7 C), Min:97.9 F (36.6 C), Max:98.3 F (36.8 C)   Recent Labs Lab 07/01/16 1946 07/01/16 2007 07/02/16 0530 07/03/16 0606  WBC 9.4  --   --  10.8*  CREATININE 5.34*  --  5.72* 4.36*  LATICACIDVEN  --  1.45  --   --   VANCORANDOM  --   --   --  33    Estimated Creatinine Clearance: 12.2 mL/min (by C-G formula based on SCr of 4.36 mg/dL (H)).    Allergies  Allergen Reactions  . Vicodin [Hydrocodone-Acetaminophen] Nausea And Vomiting  . Chlorhexidine Rash    Sunburn    rash  . Percocet [Oxycodone-Acetaminophen] Nausea And Vomiting   Antimicrobials this admission:  1/13 vanc >>  1/15 1/13 cefepime >> 1/15 1/13 Azith x1 1/13 CTX x1 1/15 LVQ >>  Dose adjustments this admission:  Cefepime 2g x1 given pre-HD (for HD 1/13) >> changed to cefepime 1g q24h at 2000 daily starting 1/13 Vancomycin 1g + 1750mg  given pre-HD, NO vanc ordered for 1/13 HD session   Microbiology results:  1/13 blood cx x2: sent 1/13 MRSA pcr: neg  1/13 flu A: neg   Thank you for allowing pharmacy to be a part of this patient's care.  Alycia Rossetti, PharmD, BCPS Clinical Pharmacist Pager: [phone] Clinical phone for 07/04/2016 from 7a-3:30p: [phone] If after 3:30p, please call main pharmacy at: [phone] 07/04/2016 9:10 AM

## 2016-07-04 NOTE — Discharge Summary (Addendum)
PATIENT DETAILS Name: [name] Age: 71 y.o. Sex: female Date of Birth: [birth year] MRN: [id number]. Admitting Physician: Etta Quill, DO GEX:[id number],WNU Marcello Moores, MD  Admit Date: 07/01/2016 Discharge date: 07/04/2016  Recommendations for Outpatient Follow-up:  1. Follow up with PCP in 1-2 weeks 2. Please obtain BMP/CBC in one week 3. Please repeat chest x-ray in 4-6 weeks to document resolution of pneumonia  Admitted From:  Home  Disposition: [place]: No  Equipment/Devices: None  Discharge Condition: Stable  CODE STATUS: FULL CODE  Diet recommendation:  Heart Healthy  Brief Summary: See H&P, Labs, Consult and Test reports for all details in brief, Patient is a 71 y.o. female with past medical history of ESRD on HD-TTS, paroxysmal atrial fibrillation on anticoagulation admitted with fever, confusion, found to have PNA. See below for further details  Brief Hospital Course: HCAP: Clinically improved, initially she was on broad-spectrum antibiotics with vancomycin and cefepime.  Influenza PCR negative. Blood cultures negative so far. Since clinically improved, will transition to levofloxacin. PCP to repeat chest x-ray in 4-6 weeks to document resolution. Spoke with nephrology-Dr. Inge Rise for discharge from his point of view  Nausea:?cause-occurred on 1/14-managed with just supportive measures. No vomiting, had p.m. on 1/14-resolved with supportive care. Tolerating breakfast and feels much better.   ESRD: HD TTS-nephrology followed during this hospital stay  Paroxysmal atrial fibrillation: Heart rate appears to be adequately controlled on amiodarone, INR therapeutic-pharmacy dosing Coumadin while inpatient-resume prior Coumadin dosing on discharge on 1/20-till then take 2.5 mg daily (as on Levaquin)  Anemia: Likely related to ESRD, defer to nephrology in the outpatient setting  Procedures/Studies: None  Discharge Diagnoses:  Principal  Problem:   CAP (community acquired pneumonia) Active Problems:   Essential hypertension, benign   End stage renal disease ([place])   PAF (paroxysmal atrial fibrillation) ([facility])   Hypoxemia   Discharge Instructions:  Activity:  As tolerated with Full fall precautions use walker/cane & assistance as needed   Discharge Instructions    Call MD for:  difficulty breathing, headache or visual disturbances    Complete by:  As directed    Call MD for:  severe uncontrolled pain    Complete by:  As directed    Diet - low sodium heart healthy    Complete by:  As directed    Increase activity slowly    Complete by:  As directed      Allergies as of 07/04/2016      Reactions   Vicodin [hydrocodone-acetaminophen] Nausea And Vomiting   Chlorhexidine Rash   Sunburn    rash   Percocet [oxycodone-acetaminophen] Nausea And Vomiting      Medication List    STOP taking these medications   cephALEXin 250 MG capsule Commonly known as:  KEFLEX     TAKE these medications   acetaminophen 500 MG tablet Commonly known as:  TYLENOL Take 1,000 mg by mouth every 6 (six) hours as needed for moderate pain or headache.   amiodarone 200 MG tablet Commonly known as:  PACERONE Take 1 tablet (200 mg total) by mouth daily.   bimatoprost 0.01 % Soln Commonly known as:  LUMIGAN Place 1 drop into both eyes at bedtime.   calcium acetate 667 MG capsule Commonly known as:  PHOSLO Take 1,334 mg by mouth 3 (three) times daily with meals.   levofloxacin 500 MG tablet Commonly known as:  LEVAQUIN Take 1 tablet (500 mg total) by mouth every other day. Start taking on:  07/06/2016   multivitamin with minerals tablet Take 1 tablet by mouth daily.   ranitidine 150 MG tablet Commonly known as:  ZANTAC Take 150 mg by mouth 2 (two) times daily as needed for heartburn.   saccharomyces boulardii 250 MG capsule Commonly known as:  FLORASTOR Take 1 capsule (250 mg total) by mouth 2 (two) times daily.   sodium  chloride 0.65 % Soln nasal spray Commonly known as:  OCEAN Place 1 spray into both nostrils 3 (three) times daily as needed for congestion.   timolol 0.5 % ophthalmic solution Commonly known as:  BETIMOL Place 1 drop into both eyes every morning.   warfarin 5 MG tablet Commonly known as:  COUMADIN For this week take 2.5 mg of coumadin daily, starting on 1/20 take coumadin as directed by the coumadin clinic (2.5-5 mg, Oral, Daily-1800, TAKES 5MG  ON MON, WED AND FRI) What changed:  additional instructions      Follow-up Information    [name], MD. Schedule an appointment as soon as possible for a visit in 1 week(s).   Specialty:  Internal Medicine Contact information: [address] [place] [address] [phone]        Hemodialysis clinic Follow up.   Why:  Please resume at your prior schedule         Allergies  Allergen Reactions  . Vicodin [Hydrocodone-Acetaminophen] Nausea And Vomiting  . Chlorhexidine Rash    Sunburn    rash  . Percocet [Oxycodone-Acetaminophen] Nausea And Vomiting      Consultations:  Renal   Other Procedures/Studies: Dg Chest 2 View  Result Date: 07/01/2016 CLINICAL DATA:  Patient started feeling bad at 6 p.m. today. Dry cough. Fever. EXAM: CHEST  2 VIEW COMPARISON:  05/24/2016. FINDINGS: Cardiomegaly. Hiatal hernia. There is LEFT lower lobe opacity consistent with early pneumonia, worse from priors. Mild vascular congestion. No significant effusion or pneumothorax. IMPRESSION: Cardiomegaly.  Early LEFT lower lobe pneumonia. Electronically Signed   By: Staci Righter M.D.   On: 07/01/2016 20:39     TODAY-DAY OF DISCHARGE:  Subjective:   [name] today has no headache,no chest abdominal pain,no new weakness tingling or numbness, feels much better wants to go home today.   Objective:   Blood pressure (!) 134/56, pulse 70, temperature 97.4 F (36.3 C), temperature source Oral, resp. rate 17, height  5\' 3"  (1.6 m), weight 84.1 kg (185 lb 8 oz), last menstrual period 01/19/1992, SpO2 100 %.  Intake/Output Summary (Last 24 hours) at 07/04/16 1147 Last data filed at 07/04/16 0930  Gross per 24 hour  Intake              400 ml  Output              350 ml  Net               50 ml   Filed Weights   07/02/16 1500 07/02/16 1828 07/02/16 2159  Weight: 85.7 kg (188 lb 15 oz) 84.2 kg (185 lb 10 oz) 84.1 kg (185 lb 8 oz)    Exam: Awake Alert, Oriented *3, No new F.N deficits, Normal affect .AT,PERRAL Supple Neck,No JVD, No cervical lymphadenopathy appriciated.  Symmetrical Chest wall movement, Good air movement bilaterally, CTAB RRR,No Gallops,Rubs or new Murmurs, No Parasternal Heave +ve B.Sounds, Abd Soft, Non tender, No organomegaly appriciated, No rebound -guarding or rigidity. No Cyanosis, Clubbing or edema, No new Rash or bruise   PERTINENT RADIOLOGIC STUDIES: Dg Chest 2  View  Result Date: 07/01/2016 CLINICAL DATA:  Patient started feeling bad at 6 p.m. today. Dry cough. Fever. EXAM: CHEST  2 VIEW COMPARISON:  05/24/2016. FINDINGS: Cardiomegaly. Hiatal hernia. There is LEFT lower lobe opacity consistent with early pneumonia, worse from priors. Mild vascular congestion. No significant effusion or pneumothorax. IMPRESSION: Cardiomegaly.  Early LEFT lower lobe pneumonia. Electronically Signed   By: Staci Righter M.D.   On: 07/01/2016 20:39     PERTINENT LAB RESULTS: CBC:  Recent Labs  07/01/16 1946 07/03/16 0606  WBC 9.4 10.8*  HGB 10.4* 9.6*  HCT 32.7* 29.7*  PLT 185 172   CMET CMP     Component Value Date/Time   NA 138 07/03/2016 0606   K 3.7 07/03/2016 0606   CL 100 (L) 07/03/2016 0606   CO2 30 07/03/2016 0606   GLUCOSE 94 07/03/2016 0606   BUN 22 (H) 07/03/2016 0606   CREATININE 4.36 (H) 07/03/2016 0606   CALCIUM 8.7 (L) 07/03/2016 0606   CALCIUM 8.8 11/07/2015 1107   PROT 5.8 (L) 07/01/2016 1946   ALBUMIN 2.4 (L) 07/03/2016 0606   AST 25 07/01/2016 1946    ALT 21 07/01/2016 1946   ALKPHOS 81 07/01/2016 1946   BILITOT 0.3 07/01/2016 1946   GFRNONAA 9 (L) 07/03/2016 0606   GFRAA 11 (L) 07/03/2016 0606    GFR Estimated Creatinine Clearance: 12.2 mL/min (by C-G formula based on SCr of 4.36 mg/dL (H)). No results for input(s): LIPASE, AMYLASE in the last 72 hours. No results for input(s): CKTOTAL, CKMB, CKMBINDEX, TROPONINI in the last 72 hours. Invalid input(s): POCBNP No results for input(s): DDIMER in the last 72 hours. No results for input(s): HGBA1C in the last 72 hours. No results for input(s): CHOL, HDL, LDLCALC, TRIG, CHOLHDL, LDLDIRECT in the last 72 hours. No results for input(s): TSH, T4TOTAL, T3FREE, THYROIDAB in the last 72 hours.  Invalid input(s): FREET3 No results for input(s): VITAMINB12, FOLATE, FERRITIN, TIBC, IRON, RETICCTPCT in the last 72 hours. Coags:  Recent Labs  07/03/16 0606 07/04/16 0518  INR 2.18 2.82   Microbiology: Recent Results (from the past 240 hour(s))  Culture, blood (Routine X 2) w Reflex to ID Panel     Status: None (Preliminary result)   Collection Time: 07/02/16  2:50 AM  Result Value Ref Range Status   Specimen Description BLOOD RIGHT ARM  Final   Special Requests BOTTLES DRAWN AEROBIC AND ANAEROBIC 5ML  Final   Culture NO GROWTH 1 DAY  Final   Report Status PENDING  Incomplete  Culture, blood (Routine X 2) w Reflex to ID Panel     Status: None (Preliminary result)   Collection Time: 07/02/16  3:00 AM  Result Value Ref Range Status   Specimen Description BLOOD RIGHT HAND  Final   Special Requests BOTTLES DRAWN AEROBIC AND ANAEROBIC 5ML  Final   Culture NO GROWTH 1 DAY  Final   Report Status PENDING  Incomplete  Culture, blood (routine x 2) Call MD if unable to obtain prior to antibiotics being given     Status: None (Preliminary result)   Collection Time: 07/02/16  5:36 AM  Result Value Ref Range Status   Specimen Description BLOOD RIGHT HAND  Final   Special Requests BOTTLES DRAWN  AEROBIC AND ANAEROBIC 10CC EA  Final   Culture NO GROWTH 1 DAY  Final   Report Status PENDING  Incomplete  Culture, blood (routine x 2) Call MD if unable to obtain prior to antibiotics being given  Status: None (Preliminary result)   Collection Time: 07/02/16  5:48 AM  Result Value Ref Range Status   Specimen Description BLOOD RIGHT WRIST  Final   Special Requests BOTTLES DRAWN AEROBIC AND ANAEROBIC 5CC EA  Final   Culture NO GROWTH 1 DAY  Final   Report Status PENDING  Incomplete  MRSA PCR Screening     Status: None   Collection Time: 07/02/16  5:53 AM  Result Value Ref Range Status   MRSA by PCR NEGATIVE NEGATIVE Final    Comment:        The GeneXpert MRSA Assay (FDA approved for NASAL specimens only), is one component of a comprehensive MRSA colonization surveillance program. It is not intended to diagnose MRSA infection nor to guide or monitor treatment for MRSA infections.     FURTHER DISCHARGE INSTRUCTIONS:  Get Medicines reviewed and adjusted: Please take all your medications with you for your next visit with your Primary MD  Laboratory/radiological data: Please request your Primary MD to go over all hospital tests and procedure/radiological results at the follow up, please ask your Primary MD to get all Hospital records sent to his/her office.  In some cases, they will be blood work, cultures and biopsy results pending at the time of your discharge. Please request that your primary care M.D. goes through all the records of your hospital data and follows up on these results.  Also Note the following: If you experience worsening of your admission symptoms, develop shortness of breath, life threatening emergency, suicidal or homicidal thoughts you must seek medical attention immediately by calling 911 or calling your MD immediately  if symptoms less severe.  You must read complete instructions/literature along with all the possible adverse reactions/side effects for  all the Medicines you take and that have been prescribed to you. Take any new Medicines after you have completely understood and accpet all the possible adverse reactions/side effects.   Do not drive when taking Pain medications or sleeping medications (Benzodaizepines)  Do not take more than prescribed Pain, Sleep and Anxiety Medications. It is not advisable to combine anxiety,sleep and pain medications without talking with your primary care practitioner  Special Instructions: If you have smoked or chewed Tobacco  in the last 2 yrs please stop smoking, stop any regular Alcohol  and or any Recreational drug use.  Wear Seat belts while driving.  Please note: You were cared for by a hospitalist during your hospital stay. Once you are discharged, your primary care physician will handle any further medical issues. Please note that NO REFILLS for any discharge medications will be authorized once you are discharged, as it is imperative that you return to your primary care physician (or establish a relationship with a primary care physician if you do not have one) for your post hospital discharge needs so that they can reassess your need for medications and monitor your lab values.  Total Time spent coordinating discharge including counseling, education and face to face time equals 45 minutes.  SignedOren Binet 07/04/2016 11:47 AM

## 2016-07-04 NOTE — Progress Notes (Signed)
[name] to be D/C'd Home per MD order.  Discussed prescriptions and follow up appointments with the patient. Prescriptions given to patient, medication list explained in detail. Pt verbalized understanding.  Allergies as of 07/04/2016      Reactions   Vicodin [hydrocodone-acetaminophen] Nausea And Vomiting   Chlorhexidine Rash   Sunburn    rash   Percocet [oxycodone-acetaminophen] Nausea And Vomiting      Medication List    STOP taking these medications   cephALEXin 250 MG capsule Commonly known as:  KEFLEX     TAKE these medications   acetaminophen 500 MG tablet Commonly known as:  TYLENOL Take 1,000 mg by mouth every 6 (six) hours as needed for moderate pain or headache.   amiodarone 200 MG tablet Commonly known as:  PACERONE Take 1 tablet (200 mg total) by mouth daily.   bimatoprost 0.01 % Soln Commonly known as:  LUMIGAN Place 1 drop into both eyes at bedtime.   calcium acetate 667 MG capsule Commonly known as:  PHOSLO Take 1,334 mg by mouth 3 (three) times daily with meals.   levofloxacin 500 MG tablet Commonly known as:  LEVAQUIN Take 1 tablet (500 mg total) by mouth every other day. Start taking on:  07/06/2016   multivitamin with minerals tablet Take 1 tablet by mouth daily.   ranitidine 150 MG tablet Commonly known as:  ZANTAC Take 150 mg by mouth 2 (two) times daily as needed for heartburn.   saccharomyces boulardii 250 MG capsule Commonly known as:  FLORASTOR Take 1 capsule (250 mg total) by mouth 2 (two) times daily.   sodium chloride 0.65 % Soln nasal spray Commonly known as:  OCEAN Place 1 spray into both nostrils 3 (three) times daily as needed for congestion.   timolol 0.5 % ophthalmic solution Commonly known as:  BETIMOL Place 1 drop into both eyes every morning.   warfarin 5 MG tablet Commonly known as:  COUMADIN For this week take 2.5 mg of coumadin daily, starting on 1/20 take coumadin as directed by the coumadin clinic (2.5-5 mg,  Oral, Daily-1800, TAKES 5MG  ON MON, WED AND FRI) What changed:  additional instructions       Vitals:   07/04/16 0603 07/04/16 1009  BP: (!) 111/54 (!) 134/56  Pulse: 67 70  Resp: 16 17  Temp: 98.3 F (36.8 C) 97.4 F (36.3 C)    Skin clean, dry and intact without evidence of skin break down, no evidence of skin tears noted. IV catheter discontinued intact. Site without signs and symptoms of complications. Dressing and pressure applied. Pt denies pain at this time. No complaints noted.  An After Visit Summary was printed and given to the patient. Patient escorted via [place], and D/C home via private auto.  Emilio Math, RN [facility] Phone [phone]

## 2016-07-07 LAB — CULTURE, BLOOD (ROUTINE X 2)
Culture: NO GROWTH
Culture: NO GROWTH
Culture: NO GROWTH
Culture: NO GROWTH

## 2016-07-13 ENCOUNTER — Ambulatory Visit (INDEPENDENT_AMBULATORY_CARE_PROVIDER_SITE_OTHER): Payer: Medicare Other | Admitting: *Deleted

## 2016-07-13 DIAGNOSIS — I4891 Unspecified atrial fibrillation: Secondary | ICD-10-CM | POA: Diagnosis not present

## 2016-07-13 DIAGNOSIS — I480 Paroxysmal atrial fibrillation: Secondary | ICD-10-CM | POA: Diagnosis not present

## 2016-07-13 DIAGNOSIS — Z5181 Encounter for therapeutic drug level monitoring: Secondary | ICD-10-CM | POA: Diagnosis not present

## 2016-07-13 LAB — POCT INR: INR: 2.5

## 2016-07-18 ENCOUNTER — Ambulatory Visit
Admission: RE | Admit: 2016-07-18 | Discharge: 2016-07-18 | Disposition: A | Discharge status: Home / Self Care | Payer: Medicare Other | Source: Ambulatory Visit | Attending: Geriatric Medicine | Admitting: Geriatric Medicine

## 2016-07-18 ENCOUNTER — Other Ambulatory Visit: Payer: Self-pay | Admitting: Geriatric Medicine

## 2016-07-18 DIAGNOSIS — Z6831 Body mass index (BMI) 31.0-31.9, adult: Secondary | ICD-10-CM | POA: Diagnosis not present

## 2016-07-18 DIAGNOSIS — Z0000 Encounter for general adult medical examination without abnormal findings: Secondary | ICD-10-CM

## 2016-07-18 DIAGNOSIS — E669 Obesity, unspecified: Secondary | ICD-10-CM | POA: Diagnosis not present

## 2016-07-18 DIAGNOSIS — J181 Lobar pneumonia, unspecified organism: Secondary | ICD-10-CM | POA: Diagnosis not present

## 2016-07-18 DIAGNOSIS — I480 Paroxysmal atrial fibrillation: Secondary | ICD-10-CM | POA: Diagnosis not present

## 2016-07-18 DIAGNOSIS — N186 End stage renal disease: Secondary | ICD-10-CM | POA: Diagnosis not present

## 2016-07-18 DIAGNOSIS — J189 Pneumonia, unspecified organism: Secondary | ICD-10-CM | POA: Diagnosis not present

## 2016-07-18 DIAGNOSIS — I129 Hypertensive chronic kidney disease with stage 1 through stage 4 chronic kidney disease, or unspecified chronic kidney disease: Secondary | ICD-10-CM | POA: Diagnosis not present

## 2016-07-18 IMAGING — NM NM RENAL IMAGING FLOW W/ PHARM
2 series · 12 of 12 positions shown · non-contrast
Comparison: None.

CLINICAL DATA: Evaluate bilateral hydronephrosis

EXAM:
NUCLEAR MEDICINE RENAL SCAN WITH DIURETIC ADMINISTRATION
TECHNIQUE: Radionuclide angiographic and sequential renal images were obtained
after intravenous injection of radiopharmaceutical. Imaging was
continued during slow intravenous injection of Lasix approximately
15 minutes after the start of the examination.
RADIOPHARMACEUTICALS:  14.6 4echnetium-33m MAG3 IV

[Series 1: renal scan · 4.14mm/px · 6 of 40 frames shown (1 of 2)]
[frame 4/40  full-range]
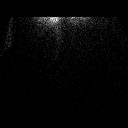
[frame 10/40  full-range]
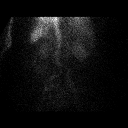
[frame 17/40  full-range]
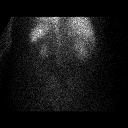
[frame 24/40  full-range]
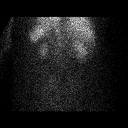
[frame 30/40  full-range]
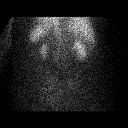
[frame 37/40  full-range]
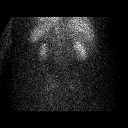

[Series 1: renal scan · 4.14mm/px · 6 of 50 frames shown (2 of 2)]
[frame 5/50]
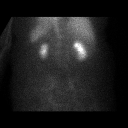
[frame 13/50]
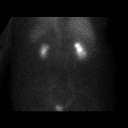
[frame 21/50]
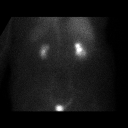
[frame 30/50]
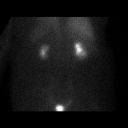
[frame 38/50]
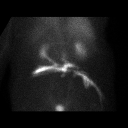
[frame 46/50]
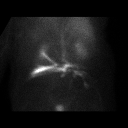

[12 of 12 positions shown; findings below may reference images not displayed]

FINDINGS: Flow: Diminished and the delayed perfusion to the left kidney
identified. Relative normal profusion to the right kidney noted. The
appearance is similar to 04/08/2015.

Left renogram: There is delayed cortical uptake, excretion and
clearance of the radiopharmaceutical by the smaller appearing left
kidney.

Right renogram: There is normal cortical uptake, excretion and
clearance of the radiopharmaceutical by the normal sized right
kidney.

Differential:

Left kidney = 31 %

Right kidney = 69 %

T1/2 post Lasix :

Left kidney = not achieved min

Right kidney = not achieved min
IMPRESSION: 1. The split renal function is equal to 31% from the left kidney and
69% from the right kidney. Previously 28% and 72%.
2. Delayed and diminished perfusion, cortical uptake and excretion
of the radiopharmaceutical by the left kidney.
3. Normal perfusion, and cortical uptake of the radiopharmaceutical
by the right kidney. There is also normal excretion and clearance of
the radiopharmaceutical.
4. No evidence for high-grade obstructive uropathy.

## 2016-07-20 DIAGNOSIS — I120 Hypertensive chronic kidney disease with stage 5 chronic kidney disease or end stage renal disease: Secondary | ICD-10-CM | POA: Diagnosis not present

## 2016-07-20 DIAGNOSIS — N186 End stage renal disease: Secondary | ICD-10-CM | POA: Diagnosis not present

## 2016-07-20 DIAGNOSIS — Z992 Dependence on renal dialysis: Secondary | ICD-10-CM | POA: Diagnosis not present

## 2016-07-21 DIAGNOSIS — I482 Chronic atrial fibrillation: Secondary | ICD-10-CM | POA: Diagnosis not present

## 2016-07-21 DIAGNOSIS — D509 Iron deficiency anemia, unspecified: Secondary | ICD-10-CM | POA: Diagnosis not present

## 2016-07-21 DIAGNOSIS — R51 Headache: Secondary | ICD-10-CM | POA: Diagnosis not present

## 2016-07-21 DIAGNOSIS — N186 End stage renal disease: Secondary | ICD-10-CM | POA: Diagnosis not present

## 2016-07-21 DIAGNOSIS — N2581 Secondary hyperparathyroidism of renal origin: Secondary | ICD-10-CM | POA: Diagnosis not present

## 2016-07-21 DIAGNOSIS — D688 Other specified coagulation defects: Secondary | ICD-10-CM | POA: Diagnosis not present

## 2016-07-21 DIAGNOSIS — D631 Anemia in chronic kidney disease: Secondary | ICD-10-CM | POA: Diagnosis not present

## 2016-07-21 DIAGNOSIS — R52 Pain, unspecified: Secondary | ICD-10-CM | POA: Diagnosis not present

## 2016-07-23 DIAGNOSIS — D688 Other specified coagulation defects: Secondary | ICD-10-CM | POA: Diagnosis not present

## 2016-07-23 DIAGNOSIS — R52 Pain, unspecified: Secondary | ICD-10-CM | POA: Diagnosis not present

## 2016-07-23 DIAGNOSIS — N2581 Secondary hyperparathyroidism of renal origin: Secondary | ICD-10-CM | POA: Diagnosis not present

## 2016-07-23 DIAGNOSIS — I482 Chronic atrial fibrillation: Secondary | ICD-10-CM | POA: Diagnosis not present

## 2016-07-23 DIAGNOSIS — N186 End stage renal disease: Secondary | ICD-10-CM | POA: Diagnosis not present

## 2016-07-23 DIAGNOSIS — D631 Anemia in chronic kidney disease: Secondary | ICD-10-CM | POA: Diagnosis not present

## 2016-07-23 DIAGNOSIS — D509 Iron deficiency anemia, unspecified: Secondary | ICD-10-CM | POA: Diagnosis not present

## 2016-07-23 DIAGNOSIS — R51 Headache: Secondary | ICD-10-CM | POA: Diagnosis not present

## 2016-07-26 DIAGNOSIS — R51 Headache: Secondary | ICD-10-CM | POA: Diagnosis not present

## 2016-07-26 DIAGNOSIS — D509 Iron deficiency anemia, unspecified: Secondary | ICD-10-CM | POA: Diagnosis not present

## 2016-07-26 DIAGNOSIS — I482 Chronic atrial fibrillation: Secondary | ICD-10-CM | POA: Diagnosis not present

## 2016-07-26 DIAGNOSIS — N2581 Secondary hyperparathyroidism of renal origin: Secondary | ICD-10-CM | POA: Diagnosis not present

## 2016-07-26 DIAGNOSIS — D631 Anemia in chronic kidney disease: Secondary | ICD-10-CM | POA: Diagnosis not present

## 2016-07-26 DIAGNOSIS — R52 Pain, unspecified: Secondary | ICD-10-CM | POA: Diagnosis not present

## 2016-07-26 DIAGNOSIS — N186 End stage renal disease: Secondary | ICD-10-CM | POA: Diagnosis not present

## 2016-07-26 DIAGNOSIS — D688 Other specified coagulation defects: Secondary | ICD-10-CM | POA: Diagnosis not present

## 2016-07-27 ENCOUNTER — Ambulatory Visit (INDEPENDENT_AMBULATORY_CARE_PROVIDER_SITE_OTHER): Payer: Medicare Other | Admitting: *Deleted

## 2016-07-27 DIAGNOSIS — I480 Paroxysmal atrial fibrillation: Secondary | ICD-10-CM

## 2016-07-27 DIAGNOSIS — Z5181 Encounter for therapeutic drug level monitoring: Secondary | ICD-10-CM | POA: Diagnosis not present

## 2016-07-27 DIAGNOSIS — I4891 Unspecified atrial fibrillation: Secondary | ICD-10-CM | POA: Diagnosis not present

## 2016-07-27 LAB — POCT INR: INR: 2.1

## 2016-07-28 DIAGNOSIS — D631 Anemia in chronic kidney disease: Secondary | ICD-10-CM | POA: Diagnosis not present

## 2016-07-28 DIAGNOSIS — N2581 Secondary hyperparathyroidism of renal origin: Secondary | ICD-10-CM | POA: Diagnosis not present

## 2016-07-28 DIAGNOSIS — R51 Headache: Secondary | ICD-10-CM | POA: Diagnosis not present

## 2016-07-28 DIAGNOSIS — D509 Iron deficiency anemia, unspecified: Secondary | ICD-10-CM | POA: Diagnosis not present

## 2016-07-28 DIAGNOSIS — N186 End stage renal disease: Secondary | ICD-10-CM | POA: Diagnosis not present

## 2016-07-28 DIAGNOSIS — I482 Chronic atrial fibrillation: Secondary | ICD-10-CM | POA: Diagnosis not present

## 2016-07-28 DIAGNOSIS — R52 Pain, unspecified: Secondary | ICD-10-CM | POA: Diagnosis not present

## 2016-07-28 DIAGNOSIS — D688 Other specified coagulation defects: Secondary | ICD-10-CM | POA: Diagnosis not present

## 2016-07-30 DIAGNOSIS — R51 Headache: Secondary | ICD-10-CM | POA: Diagnosis not present

## 2016-07-30 DIAGNOSIS — D509 Iron deficiency anemia, unspecified: Secondary | ICD-10-CM | POA: Diagnosis not present

## 2016-07-30 DIAGNOSIS — N186 End stage renal disease: Secondary | ICD-10-CM | POA: Diagnosis not present

## 2016-07-30 DIAGNOSIS — N2581 Secondary hyperparathyroidism of renal origin: Secondary | ICD-10-CM | POA: Diagnosis not present

## 2016-07-30 DIAGNOSIS — D688 Other specified coagulation defects: Secondary | ICD-10-CM | POA: Diagnosis not present

## 2016-07-30 DIAGNOSIS — R52 Pain, unspecified: Secondary | ICD-10-CM | POA: Diagnosis not present

## 2016-07-30 DIAGNOSIS — I482 Chronic atrial fibrillation: Secondary | ICD-10-CM | POA: Diagnosis not present

## 2016-07-30 DIAGNOSIS — D631 Anemia in chronic kidney disease: Secondary | ICD-10-CM | POA: Diagnosis not present

## 2016-08-02 DIAGNOSIS — D509 Iron deficiency anemia, unspecified: Secondary | ICD-10-CM | POA: Diagnosis not present

## 2016-08-02 DIAGNOSIS — N186 End stage renal disease: Secondary | ICD-10-CM | POA: Diagnosis not present

## 2016-08-02 DIAGNOSIS — N2581 Secondary hyperparathyroidism of renal origin: Secondary | ICD-10-CM | POA: Diagnosis not present

## 2016-08-02 DIAGNOSIS — I482 Chronic atrial fibrillation: Secondary | ICD-10-CM | POA: Diagnosis not present

## 2016-08-02 DIAGNOSIS — R52 Pain, unspecified: Secondary | ICD-10-CM | POA: Diagnosis not present

## 2016-08-02 DIAGNOSIS — D688 Other specified coagulation defects: Secondary | ICD-10-CM | POA: Diagnosis not present

## 2016-08-02 DIAGNOSIS — R51 Headache: Secondary | ICD-10-CM | POA: Diagnosis not present

## 2016-08-02 DIAGNOSIS — D631 Anemia in chronic kidney disease: Secondary | ICD-10-CM | POA: Diagnosis not present

## 2016-08-03 ENCOUNTER — Other Ambulatory Visit: Payer: Self-pay | Admitting: Interventional Cardiology

## 2016-08-04 ENCOUNTER — Other Ambulatory Visit: Payer: Self-pay | Admitting: *Deleted

## 2016-08-04 DIAGNOSIS — I482 Chronic atrial fibrillation: Secondary | ICD-10-CM | POA: Diagnosis not present

## 2016-08-04 DIAGNOSIS — N186 End stage renal disease: Secondary | ICD-10-CM | POA: Diagnosis not present

## 2016-08-04 DIAGNOSIS — D688 Other specified coagulation defects: Secondary | ICD-10-CM | POA: Diagnosis not present

## 2016-08-04 DIAGNOSIS — N2581 Secondary hyperparathyroidism of renal origin: Secondary | ICD-10-CM | POA: Diagnosis not present

## 2016-08-04 DIAGNOSIS — R51 Headache: Secondary | ICD-10-CM | POA: Diagnosis not present

## 2016-08-04 DIAGNOSIS — D509 Iron deficiency anemia, unspecified: Secondary | ICD-10-CM | POA: Diagnosis not present

## 2016-08-04 DIAGNOSIS — R52 Pain, unspecified: Secondary | ICD-10-CM | POA: Diagnosis not present

## 2016-08-04 DIAGNOSIS — D631 Anemia in chronic kidney disease: Secondary | ICD-10-CM | POA: Diagnosis not present

## 2016-08-06 DIAGNOSIS — R52 Pain, unspecified: Secondary | ICD-10-CM | POA: Diagnosis not present

## 2016-08-06 DIAGNOSIS — D631 Anemia in chronic kidney disease: Secondary | ICD-10-CM | POA: Diagnosis not present

## 2016-08-06 DIAGNOSIS — I482 Chronic atrial fibrillation: Secondary | ICD-10-CM | POA: Diagnosis not present

## 2016-08-06 DIAGNOSIS — N186 End stage renal disease: Secondary | ICD-10-CM | POA: Diagnosis not present

## 2016-08-06 DIAGNOSIS — R51 Headache: Secondary | ICD-10-CM | POA: Diagnosis not present

## 2016-08-06 DIAGNOSIS — D509 Iron deficiency anemia, unspecified: Secondary | ICD-10-CM | POA: Diagnosis not present

## 2016-08-06 DIAGNOSIS — D688 Other specified coagulation defects: Secondary | ICD-10-CM | POA: Diagnosis not present

## 2016-08-06 DIAGNOSIS — N2581 Secondary hyperparathyroidism of renal origin: Secondary | ICD-10-CM | POA: Diagnosis not present

## 2016-08-09 DIAGNOSIS — D509 Iron deficiency anemia, unspecified: Secondary | ICD-10-CM | POA: Diagnosis not present

## 2016-08-09 DIAGNOSIS — R51 Headache: Secondary | ICD-10-CM | POA: Diagnosis not present

## 2016-08-09 DIAGNOSIS — I482 Chronic atrial fibrillation: Secondary | ICD-10-CM | POA: Diagnosis not present

## 2016-08-09 DIAGNOSIS — D631 Anemia in chronic kidney disease: Secondary | ICD-10-CM | POA: Diagnosis not present

## 2016-08-09 DIAGNOSIS — D688 Other specified coagulation defects: Secondary | ICD-10-CM | POA: Diagnosis not present

## 2016-08-09 DIAGNOSIS — R52 Pain, unspecified: Secondary | ICD-10-CM | POA: Diagnosis not present

## 2016-08-09 DIAGNOSIS — N186 End stage renal disease: Secondary | ICD-10-CM | POA: Diagnosis not present

## 2016-08-09 DIAGNOSIS — N2581 Secondary hyperparathyroidism of renal origin: Secondary | ICD-10-CM | POA: Diagnosis not present

## 2016-08-11 DIAGNOSIS — D688 Other specified coagulation defects: Secondary | ICD-10-CM | POA: Diagnosis not present

## 2016-08-11 DIAGNOSIS — D631 Anemia in chronic kidney disease: Secondary | ICD-10-CM | POA: Diagnosis not present

## 2016-08-11 DIAGNOSIS — R52 Pain, unspecified: Secondary | ICD-10-CM | POA: Diagnosis not present

## 2016-08-11 DIAGNOSIS — D509 Iron deficiency anemia, unspecified: Secondary | ICD-10-CM | POA: Diagnosis not present

## 2016-08-11 DIAGNOSIS — R51 Headache: Secondary | ICD-10-CM | POA: Diagnosis not present

## 2016-08-11 DIAGNOSIS — I482 Chronic atrial fibrillation: Secondary | ICD-10-CM | POA: Diagnosis not present

## 2016-08-11 DIAGNOSIS — N2581 Secondary hyperparathyroidism of renal origin: Secondary | ICD-10-CM | POA: Diagnosis not present

## 2016-08-11 DIAGNOSIS — N186 End stage renal disease: Secondary | ICD-10-CM | POA: Diagnosis not present

## 2016-08-13 DIAGNOSIS — D509 Iron deficiency anemia, unspecified: Secondary | ICD-10-CM | POA: Diagnosis not present

## 2016-08-13 DIAGNOSIS — N186 End stage renal disease: Secondary | ICD-10-CM | POA: Diagnosis not present

## 2016-08-13 DIAGNOSIS — N2581 Secondary hyperparathyroidism of renal origin: Secondary | ICD-10-CM | POA: Diagnosis not present

## 2016-08-13 DIAGNOSIS — D688 Other specified coagulation defects: Secondary | ICD-10-CM | POA: Diagnosis not present

## 2016-08-13 DIAGNOSIS — R52 Pain, unspecified: Secondary | ICD-10-CM | POA: Diagnosis not present

## 2016-08-13 DIAGNOSIS — D631 Anemia in chronic kidney disease: Secondary | ICD-10-CM | POA: Diagnosis not present

## 2016-08-13 DIAGNOSIS — I482 Chronic atrial fibrillation: Secondary | ICD-10-CM | POA: Diagnosis not present

## 2016-08-13 DIAGNOSIS — R51 Headache: Secondary | ICD-10-CM | POA: Diagnosis not present

## 2016-08-16 DIAGNOSIS — D688 Other specified coagulation defects: Secondary | ICD-10-CM | POA: Diagnosis not present

## 2016-08-16 DIAGNOSIS — D631 Anemia in chronic kidney disease: Secondary | ICD-10-CM | POA: Diagnosis not present

## 2016-08-16 DIAGNOSIS — R51 Headache: Secondary | ICD-10-CM | POA: Diagnosis not present

## 2016-08-16 DIAGNOSIS — D509 Iron deficiency anemia, unspecified: Secondary | ICD-10-CM | POA: Diagnosis not present

## 2016-08-16 DIAGNOSIS — R52 Pain, unspecified: Secondary | ICD-10-CM | POA: Diagnosis not present

## 2016-08-16 DIAGNOSIS — N2581 Secondary hyperparathyroidism of renal origin: Secondary | ICD-10-CM | POA: Diagnosis not present

## 2016-08-16 DIAGNOSIS — N186 End stage renal disease: Secondary | ICD-10-CM | POA: Diagnosis not present

## 2016-08-16 DIAGNOSIS — I482 Chronic atrial fibrillation: Secondary | ICD-10-CM | POA: Diagnosis not present

## 2016-08-17 DIAGNOSIS — Z992 Dependence on renal dialysis: Secondary | ICD-10-CM | POA: Diagnosis not present

## 2016-08-17 DIAGNOSIS — N186 End stage renal disease: Secondary | ICD-10-CM | POA: Diagnosis not present

## 2016-08-17 DIAGNOSIS — I120 Hypertensive chronic kidney disease with stage 5 chronic kidney disease or end stage renal disease: Secondary | ICD-10-CM | POA: Diagnosis not present

## 2016-08-19 ENCOUNTER — Other Ambulatory Visit: Payer: Self-pay | Admitting: Gastroenterology

## 2016-08-19 ENCOUNTER — Ambulatory Visit (INDEPENDENT_AMBULATORY_CARE_PROVIDER_SITE_OTHER): Payer: Medicare Other | Admitting: *Deleted

## 2016-08-19 DIAGNOSIS — Z5181 Encounter for therapeutic drug level monitoring: Secondary | ICD-10-CM | POA: Diagnosis not present

## 2016-08-19 DIAGNOSIS — I480 Paroxysmal atrial fibrillation: Secondary | ICD-10-CM

## 2016-08-19 DIAGNOSIS — I4891 Unspecified atrial fibrillation: Secondary | ICD-10-CM | POA: Diagnosis not present

## 2016-08-19 LAB — POCT INR: INR: 2.4

## 2016-09-17 DIAGNOSIS — N186 End stage renal disease: Secondary | ICD-10-CM | POA: Diagnosis not present

## 2016-09-17 DIAGNOSIS — I129 Hypertensive chronic kidney disease with stage 1 through stage 4 chronic kidney disease, or unspecified chronic kidney disease: Secondary | ICD-10-CM | POA: Diagnosis not present

## 2016-09-17 DIAGNOSIS — Z992 Dependence on renal dialysis: Secondary | ICD-10-CM | POA: Diagnosis not present

## 2016-09-23 ENCOUNTER — Ambulatory Visit (INDEPENDENT_AMBULATORY_CARE_PROVIDER_SITE_OTHER): Payer: Medicare Other | Admitting: *Deleted

## 2016-09-23 DIAGNOSIS — I4891 Unspecified atrial fibrillation: Secondary | ICD-10-CM

## 2016-09-23 DIAGNOSIS — Z5181 Encounter for therapeutic drug level monitoring: Secondary | ICD-10-CM | POA: Diagnosis not present

## 2016-09-23 DIAGNOSIS — I480 Paroxysmal atrial fibrillation: Secondary | ICD-10-CM

## 2016-09-23 LAB — POCT INR: INR: 1.9

## 2016-09-26 ENCOUNTER — Telehealth: Payer: Self-pay | Admitting: *Deleted

## 2016-09-26 ENCOUNTER — Telehealth: Payer: Self-pay | Admitting: Pharmacist

## 2016-09-26 NOTE — Telephone Encounter (Signed)
Spoke with Katrina at Dr Durenda Age office and she states for clearance for the colonoscopy to write on prescription form and fax to her office at [phone]

## 2016-09-26 NOTE — Telephone Encounter (Signed)
Pt to have colonoscopy on 10/17/16. Pt on warfarin for Afib with CHADS <4 and no history of stroke. Ok to hold warfarin 5 day prior to procedure. Resume evening of procedure unless otherwise advised by MD.

## 2016-10-07 ENCOUNTER — Ambulatory Visit (INDEPENDENT_AMBULATORY_CARE_PROVIDER_SITE_OTHER): Payer: Medicare Other | Admitting: *Deleted

## 2016-10-07 DIAGNOSIS — I4891 Unspecified atrial fibrillation: Secondary | ICD-10-CM

## 2016-10-07 DIAGNOSIS — Z5181 Encounter for therapeutic drug level monitoring: Secondary | ICD-10-CM

## 2016-10-07 DIAGNOSIS — I480 Paroxysmal atrial fibrillation: Secondary | ICD-10-CM | POA: Diagnosis not present

## 2016-10-07 LAB — POCT INR: INR: 2.3

## 2016-10-07 NOTE — Patient Instructions (Signed)
Colonoscopy 10/17/16 Last dose of Coumadin on 10/11/16 Resume your Coumadin once instructed to do so by GI Doctor. Once Coumadin is resumed take an extra 1/2 tablet along with regular dose for 2 days then resume normal dose.

## 2016-10-12 ENCOUNTER — Encounter (HOSPITAL_COMMUNITY): Payer: Self-pay | Admitting: *Deleted

## 2016-10-17 ENCOUNTER — Encounter (HOSPITAL_COMMUNITY): Payer: Self-pay

## 2016-10-17 ENCOUNTER — Encounter (HOSPITAL_COMMUNITY)
Admission: RE | Disposition: A | Discharge status: Home / Self Care | Payer: Self-pay | Source: Ambulatory Visit | Attending: Gastroenterology

## 2016-10-17 ENCOUNTER — Ambulatory Visit (HOSPITAL_COMMUNITY)
Admission: RE | Admit: 2016-10-17 | Discharge: 2016-10-17 | Disposition: A | Discharge status: Home / Self Care | Payer: Medicare Other | Source: Ambulatory Visit | Attending: Gastroenterology | Admitting: Gastroenterology

## 2016-10-17 ENCOUNTER — Ambulatory Visit (HOSPITAL_COMMUNITY): Payer: Medicare Other | Admitting: Certified Registered Nurse Anesthetist

## 2016-10-17 DIAGNOSIS — D120 Benign neoplasm of cecum: Secondary | ICD-10-CM | POA: Insufficient documentation

## 2016-10-17 DIAGNOSIS — Z87442 Personal history of urinary calculi: Secondary | ICD-10-CM | POA: Diagnosis not present

## 2016-10-17 DIAGNOSIS — I739 Peripheral vascular disease, unspecified: Secondary | ICD-10-CM | POA: Diagnosis not present

## 2016-10-17 DIAGNOSIS — N186 End stage renal disease: Secondary | ICD-10-CM | POA: Diagnosis not present

## 2016-10-17 DIAGNOSIS — Z992 Dependence on renal dialysis: Secondary | ICD-10-CM | POA: Insufficient documentation

## 2016-10-17 DIAGNOSIS — Z936 Other artificial openings of urinary tract status: Secondary | ICD-10-CM | POA: Insufficient documentation

## 2016-10-17 DIAGNOSIS — Z1211 Encounter for screening for malignant neoplasm of colon: Secondary | ICD-10-CM | POA: Insufficient documentation

## 2016-10-17 DIAGNOSIS — D649 Anemia, unspecified: Secondary | ICD-10-CM | POA: Diagnosis not present

## 2016-10-17 DIAGNOSIS — Z90710 Acquired absence of both cervix and uterus: Secondary | ICD-10-CM | POA: Diagnosis not present

## 2016-10-17 DIAGNOSIS — Z800 Family history of malignant neoplasm of digestive organs: Secondary | ICD-10-CM | POA: Diagnosis not present

## 2016-10-17 DIAGNOSIS — I120 Hypertensive chronic kidney disease with stage 5 chronic kidney disease or end stage renal disease: Secondary | ICD-10-CM | POA: Insufficient documentation

## 2016-10-17 DIAGNOSIS — I252 Old myocardial infarction: Secondary | ICD-10-CM | POA: Insufficient documentation

## 2016-10-17 DIAGNOSIS — K219 Gastro-esophageal reflux disease without esophagitis: Secondary | ICD-10-CM | POA: Diagnosis not present

## 2016-10-17 DIAGNOSIS — I129 Hypertensive chronic kidney disease with stage 1 through stage 4 chronic kidney disease, or unspecified chronic kidney disease: Secondary | ICD-10-CM | POA: Diagnosis not present

## 2016-10-17 HISTORY — PX: COLONOSCOPY WITH PROPOFOL: SHX5780

## 2016-10-17 SURGERY — COLONOSCOPY WITH PROPOFOL
Anesthesia: Monitor Anesthesia Care

## 2016-10-17 MED ORDER — PROPOFOL 10 MG/ML IV BOLUS
INTRAVENOUS | Status: DC | PRN
  Administered 2016-10-17 (×2): 10 mg via INTRAVENOUS
  Administered 2016-10-17: 20 mg via INTRAVENOUS
  Administered 2016-10-17: 10 mg via INTRAVENOUS
  Administered 2016-10-17: 20 mg via INTRAVENOUS
  Administered 2016-10-17: 10 mg via INTRAVENOUS
  Administered 2016-10-17: 30 mg via INTRAVENOUS
  Administered 2016-10-17: 10 mg via INTRAVENOUS
  Administered 2016-10-17: 20 mg via INTRAVENOUS

## 2016-10-17 MED ORDER — LACTATED RINGERS IV SOLN
INTRAVENOUS | Status: DC
  Administered 2016-10-17: 08:00:00 via INTRAVENOUS

## 2016-10-17 MED ORDER — PROPOFOL 10 MG/ML IV BOLUS
INTRAVENOUS | Status: AC
  Filled 2016-10-17: qty 60

## 2016-10-17 MED ORDER — SODIUM CHLORIDE 0.9 % IV SOLN
INTRAVENOUS | Status: DC
  Administered 2016-10-17: 1000 mL via INTRAVENOUS

## 2016-10-17 MED ORDER — PROPOFOL 500 MG/50ML IV EMUL
INTRAVENOUS | Status: DC | PRN
  Administered 2016-10-17: 100 ug/kg/min via INTRAVENOUS

## 2016-10-17 SURGICAL SUPPLY — 22 items

## 2016-10-17 NOTE — H&P (Signed)
Procedure: Screening colonoscopy. Chronic warfarin anticoagulation due to permanent atrial fibrillation. Brother was diagnosed with colon cancer before age 72. 02/16/2010 normal screening colonoscopy was performed. Hemodialysis performed on Saturday-Tuesday-Thursday  History: The patient is a 71 year old female born 1946/03/25. Her brother was diagnosed with colon cancer before age 49. She is scheduled to undergo a repeat screening colonoscopy today. She stopped taking Coumadin 5 days ago.  Past medical history: Kidney stones. Hemodialysis. Hypertension. Atrial fibrillation. Gastroesophageal reflux. Hysterectomy. Knee surgery. Cholecystectomy. Bilateral cataract surgery. Kidney stone removal.  Exam: The patient is alert and lying comfortably on the endoscopy stretcher. Abdomen is soft and nontender to palpation. Lungs are clear to auscultation. Cardiac exam reveals a regular rhythm. Patient takes warfarin chronically due to atrial fibrillation.  Plan: Proceed with screening colonoscopy

## 2016-10-17 NOTE — Op Note (Signed)
[facility] Patient Name: [name] Procedure Date: 10/17/2016 MRN: [id number] Attending MD: Garlan Fair , MD Date of Birth: 01/05/1946 CSN: [id number] Age: 71 Admit Type: Outpatient Procedure:                Colonoscopy Indications:              Screening in patient at increased risk: Family                            history of 1st-degree relative with colorectal                            cancer before age 22 years Providers:                Garlan Fair, MD, Hilma Favors, RN, Ralene Bathe, Technician, Heide Scales, CRNA Referring MD:              Medicines:                Propofol per Anesthesia Complications:            No immediate complications. Estimated Blood Loss:     Estimated blood loss was minimal. Procedure:                Pre-Anesthesia Assessment:                           - Prior to the procedure, a History and Physical                            was performed, and patient medications and                            allergies were reviewed. The patient's tolerance of                            previous anesthesia was also reviewed. The risks                            and benefits of the procedure and the sedation                            options and risks were discussed with the patient.                            All questions were answered, and informed consent                            was obtained. Prior Anticoagulants: The patient has                            taken Coumadin (warfarin), last dose was 5 days  prior to procedure. ASA Grade Assessment: III - A                            patient with severe systemic disease. After                            reviewing the risks and benefits, the patient was                            deemed in satisfactory condition to undergo the                            procedure.                           After obtaining informed consent, the  colonoscope                            was passed under direct vision. Throughout the                            procedure, the patient's blood pressure, pulse, and                            oxygen saturations were monitored continuously. The                            EC-[id number] ([id number]) scope was introduced through                            the anus and advanced to the the cecum, identified                            by appendiceal orifice and ileocecal valve. The                            colonoscopy was technically difficult and complex                            due to significant looping. The patient tolerated                            the procedure well. The quality of the bowel                            preparation was good. The appendiceal orifice and                            the rectum were photographed. Scope In: 9:00:34 AM Scope Out: 9:32:42 AM Scope Withdrawal Time: 0 hours 18 minutes 6 seconds  Total Procedure Duration: 0 hours 32 minutes 8 seconds  Findings:      The perianal and digital rectal examinations were normal.      A 6 mm polyp was found in the cecum. The  polyp was sessile. The polyp       was removed with a cold snare. Resection and retrieval were complete.       Two endoclips were applied to the polypectomy site.      The exam was otherwise without abnormality. Extensive left colonic       diverticulosis was present Impression:               - One 6 mm polyp in the cecum, removed with a cold                            snare. Resected and retrieved.                           - The examination was otherwise normal. Moderate Sedation:      N/A- Per Anesthesia Care Recommendation:           - Patient has a contact number available for                            emergencies. The signs and symptoms of potential                            delayed complications were discussed with the                            patient. Return to normal activities tomorrow.                             Written discharge instructions were provided to the                            patient.                           - Repeat colonoscopy in 5 years for surveillance.                           - Resume previous diet.                           - Continue present medications. Procedure Code(s):        --- Professional ---                           [phone], Colonoscopy, flexible; with removal of                            tumor(s), polyp(s), or other lesion(s) by snare                            technique Diagnosis Code(s):        --- Professional ---                           Z80.0, Family history of malignant neoplasm of  digestive organs                           D12.0, Benign neoplasm of cecum CPT copyright 2016 American Medical Association. All rights reserved. The codes documented in this report are preliminary and upon coder review may  be revised to meet current compliance requirements. Earle Gell, MD Garlan Fair, MD 10/17/2016 9:39:23 AM This report has been signed electronically. Number of Addenda: 0

## 2016-10-17 NOTE — Discharge Instructions (Signed)
YOU HAD AN ENDOSCOPIC PROCEDURE TODAY: Refer to the procedure report and other information in the discharge instructions given to you for any specific questions about what was found during the examination. If this information does not answer your questions, please call Dr. Johnson office at [phone] to clarify.  ° °YOU SHOULD EXPECT: Some feelings of bloating in the abdomen. Passage of more gas than usual. Walking can help get rid of the air that was put into your GI tract during the procedure and reduce the bloating. If you had a lower endoscopy (such as a colonoscopy or flexible sigmoidoscopy) you may notice spotting of blood in your stool or on the toilet paper. Some abdominal soreness may be present for a day or two, also. ° °DIET: Your first meal following the procedure should be a light meal and then it is ok to progress to your normal diet. A half-sandwich or bowl of soup is an example of a good first meal. Heavy or fried foods are harder to digest and may make you feel nauseous or bloated. Drink plenty of fluids but you should avoid alcoholic beverages for 24 hours. If you had a esophageal dilation, please see attached instructions for diet.  ° °ACTIVITY: Your care partner should take you home directly after the procedure. You should plan to take it easy, moving slowly for the rest of the day. You can resume normal activity the day after the procedure however YOU SHOULD NOT DRIVE, use power tools, machinery or perform tasks that involve climbing or major physical exertion for 24 hours (because of the sedation medicines used during the test).  ° °SYMPTOMS TO REPORT IMMEDIATELY: °A gastroenterologist can be reached at any hour. Please call [phone] for any of the following symptoms:  °Following lower endoscopy (colonoscopy, flexible sigmoidoscopy) °Excessive amounts of blood in the stool  °Significant tenderness, worsening of abdominal pains  °Swelling of the abdomen that is new, acute  °Fever of 100°  or higher  °Following upper endoscopy (EGD, EUS, ERCP, esophageal dilation) °Vomiting of blood or coffee ground material  °New, significant abdominal pain  °New, significant chest pain or pain under the shoulder blades  °Painful or persistently difficult swallowing  °New shortness of breath  °Black, tarry-looking or red, bloody stools ° °FOLLOW UP:  °If any biopsies were taken you will be contacted by phone or by letter within the next 1-3 weeks. Call [phone]  if you have not heard about the biopsies in 3 weeks.  °Please also call with any specific questions about appointments or follow up tests. ° ° °

## 2016-10-17 NOTE — Anesthesia Postprocedure Evaluation (Addendum)
Anesthesia Post Note  Patient: [name]  Procedure(s) Performed: Procedure(s) (LRB): COLONOSCOPY WITH PROPOFOL (N/A)  Patient location during evaluation: PACU Anesthesia Type: MAC Level of consciousness: awake Pain management: pain level controlled Vital Signs Assessment: post-procedure vital signs reviewed and stable Respiratory status: spontaneous breathing Cardiovascular status: stable Postop Assessment: no signs of nausea or vomiting Anesthetic complications: no        Last Vitals:  Vitals:   10/17/16 0732 10/17/16 0939  BP: (!) 158/65 (!) 111/47  Pulse: 77 69  Resp: 15 14  Temp: 36.6 C 36.6 C    Last Pain:  Vitals:   10/17/16 0939  TempSrc: Oral   Pain Goal:                 Bynum Mccullars JR,JOHN Damyia Strider

## 2016-10-17 NOTE — Transfer of Care (Signed)
Immediate Anesthesia Transfer of Care Note  Patient: [name]  Procedure(s) Performed: Procedure(s): COLONOSCOPY WITH PROPOFOL (N/A)  Patient Location: PACU  Anesthesia Type:MAC  Level of Consciousness: Patient easily awoken, sedated, comfortable, cooperative, following commands, responds to stimulation.   Airway & Oxygen Therapy: Patient spontaneously breathing, ventilating well, oxygen via simple oxygen mask.  Post-op Assessment: Report given to PACU RN, vital signs reviewed and stable, moving all extremities.   Post vital signs: Reviewed and stable.  Complications: No apparent anesthesia complications  Vitals:   10/17/16 0732  BP: (!) 158/65  Pulse: 77  Resp: 15  Temp: 36.6 C    Last Pain:  Vitals:   10/17/16 0732  TempSrc: Oral         Complications: No apparent anesthesia complications

## 2016-10-17 NOTE — Anesthesia Preprocedure Evaluation (Addendum)
Anesthesia Evaluation  Patient identified by MRN, date of birth, ID band Patient awake    Reviewed: Allergy & Precautions, NPO status , Patient's Chart, lab work & pertinent test results  Airway Mallampati: II  TM Distance: >3 FB Neck ROM: Full    Dental no notable dental hx. (+) Teeth Intact   Pulmonary    Pulmonary exam normal breath sounds clear to auscultation       Cardiovascular hypertension, Pt. on medications + CAD, + Past MI and + Peripheral Vascular Disease  Normal cardiovascular exam+ dysrhythmias  Rhythm:Regular Rate:Normal + Systolic murmurs    Neuro/Psych  Headaches,    GI/Hepatic hiatal hernia, GERD  Controlled and Medicated,  Endo/Other    Renal/GU ESRF and DialysisRenal diseasePt not yet on HD, had one cycle during sepsis episode in 2013. Also, she has a right nephrostomy bag.     Musculoskeletal negative musculoskeletal ROS (+)   Abdominal (+) + obese,   Peds  Hematology  (+) anemia ,   Anesthesia Other Findings [place], [place] [address]                            [phone]  ------------------------------------------------------------------- Transthoracic Echocardiography  (Report amended )  Patient:    [name], [place] MR #:       [id number] Study Date: 11/22/2015 Gender:     F Age:        71 Height:     160 cm Weight:     79.5 kg BSA:        1.91 m^2 Pt. Status: Room:       2W28C   Ambrose Pancoast, Dayna N  REFERRING    Melina Copa N  SONOGRAPHER  [facility]  ATTENDING    [place], [place] [id number]  PERFORMING   [facility], Inpatient  cc:  ------------------------------------------------------------------- LV EF: 60% -   65%  ------------------------------------------------------------------- Indications:      Pericardial effusion 423.9,  suspected, pre-procedure  Reproductive/Obstetrics                            Anesthesia Physical  Anesthesia Plan  ASA: III  Anesthesia Plan: MAC   Post-op Pain Management:    Induction: Intravenous  Airway Management Planned: Natural Airway and Simple Face Mask  Additional Equipment:   Intra-op Plan:   Post-operative Plan:   Informed Consent: I have reviewed the patients History and Physical, chart, labs and discussed the procedure including the risks, benefits and alternatives for the proposed anesthesia with the patient or authorized representative who has indicated his/her understanding and acceptance.   Dental advisory given  Plan Discussed with: CRNA and Surgeon  Anesthesia Plan Comments:         Anesthesia Quick Evaluation

## 2016-10-19 ENCOUNTER — Encounter (HOSPITAL_COMMUNITY): Payer: Self-pay | Admitting: Gastroenterology

## 2016-10-24 ENCOUNTER — Ambulatory Visit (INDEPENDENT_AMBULATORY_CARE_PROVIDER_SITE_OTHER): Payer: Medicare Other | Admitting: *Deleted

## 2016-10-24 DIAGNOSIS — I480 Paroxysmal atrial fibrillation: Secondary | ICD-10-CM

## 2016-10-24 DIAGNOSIS — I4891 Unspecified atrial fibrillation: Secondary | ICD-10-CM

## 2016-10-24 DIAGNOSIS — Z5181 Encounter for therapeutic drug level monitoring: Secondary | ICD-10-CM

## 2016-10-24 LAB — POCT INR: INR: 1.7

## 2016-10-31 ENCOUNTER — Encounter: Payer: Self-pay | Admitting: Nurse Practitioner

## 2016-10-31 ENCOUNTER — Ambulatory Visit (INDEPENDENT_AMBULATORY_CARE_PROVIDER_SITE_OTHER): Payer: Medicare Other | Admitting: Nurse Practitioner

## 2016-10-31 VITALS — BP 110/64 | HR 80 | Ht 62.25 in | Wt 190.0 lb

## 2016-10-31 DIAGNOSIS — N184 Chronic kidney disease, stage 4 (severe): Secondary | ICD-10-CM | POA: Diagnosis not present

## 2016-10-31 DIAGNOSIS — Z01411 Encounter for gynecological examination (general) (routine) with abnormal findings: Secondary | ICD-10-CM

## 2016-10-31 DIAGNOSIS — B372 Candidiasis of skin and nail: Secondary | ICD-10-CM

## 2016-10-31 DIAGNOSIS — M85852 Other specified disorders of bone density and structure, left thigh: Secondary | ICD-10-CM

## 2016-10-31 DIAGNOSIS — M85851 Other specified disorders of bone density and structure, right thigh: Secondary | ICD-10-CM

## 2016-10-31 DIAGNOSIS — Z0000 Encounter for general adult medical examination without abnormal findings: Secondary | ICD-10-CM | POA: Diagnosis not present

## 2016-10-31 MED ORDER — NYSTATIN 100000 UNIT/GM EX CREA
TOPICAL_CREAM | CUTANEOUS | 2 refills | Status: DC
Start: 2016-10-31 — End: 2017-11-25

## 2016-10-31 MED ORDER — TRIAMCINOLONE ACETONIDE 0.025 % EX OINT: 1.0000 "application " | TOPICAL_OINTMENT | Freq: Two times a day (BID) | CUTANEOUS | 2 refills | Status: DC

## 2016-10-31 NOTE — Progress Notes (Signed)
71 y.o. G66P1001 Widowed  Caucasian Fe here for annual exam.  The only new health problems is pneumonia with hospital = last one in January this year.  She is on low dose Keflex to recurrent UTI.  Started kidney dialysis  10/2015.  Goes on  T - Th -Sat.    Wants to go to beach with daughter and dog.  May get dialysis at the beach.  Daughter lives with her.  Patient's last menstrual period was 01/19/1992 (approximate).          Sexually active: No.  The current method of family planning is abstinence and status post hysterectomy.    Exercising: Yes.    walking the dog 2 times per day Smoker:  no  Health Maintenance: Pap: 07/31/07, Negative (hyst due to fibroids) History of Abnormal Pap: no MMG: 10/02/14, 3D-yes, Density Category B, Bi-Rads 1:  Negative Self Breast exams: no Colonoscopy: 10/17/16, polyp, repeat in 5 years BMD: 10/02/14 T Score: 0.5 Spine / -2.0 Right Femur Neck / -1.6 Left Femur Neck / -1.3 Left 1/3 Radius TDaP: 06/20/08 Shingles: completed Pneumonia: 2018 after pneumonia Hep C: never, will have Dr. Felipa Eth test in August HIV: 07/19/16 Labs: Multiple labs in EPIC   reports that she has never smoked. She has never used smokeless tobacco. She reports that she does not drink alcohol or use drugs.  Past Medical History:  Diagnosis Date  . Cardiomyopathy ([place])    a. h/o LV dysfunction EF 20-25% in 2013 due to sepsis.>> improved to normal   . Chronic diastolic CHF (congestive heart failure) ([place])    10/ 2013 in setting of septic shock  . Complication of anesthesia    use a little anesthesia , per patient MD states she quit breathing (2016); hard to wake up  . ESRD (end stage renal disease) ([place])    dialysis Tues, Thurs, Sat [address], sees dr deterding   . GERD (gastroesophageal reflux disease)   . Glaucoma    both eyes  . H/O hiatal hernia    a. CT 2017: large gastric hiatal hernia.  Marland Kitchen Headache(784.0)    migraine hx of  . History of blood transfusion 04/13/2015   .  History of echocardiogram    a. Echo 6/17: EF 60-65%, normal wall motion, mild MR, atrial septal lipomatous hypertrophy, PASP 34 mmHg, possible trivial free-flowing pericardial effusion along RV free wall // b. Echo 5/17: Mild LVH, EF 55-60%, normal wall motion, grade 1 diastolic dysfunction, trivial MR, severe LAE, mild RAE, PASP 42 mmHg  . History of nephrostomy ([place]) 04/11/2015   currently inplace 04/28/2015  removed now  . History of nuclear stress test    a. Myoview 1/14 - Marked ischemia in the basal anterior, mid anterior, apical septal and apical inferior regions, EF 63% >> LHC normal   . Hyperlipidemia   . Hypertension    medication removed from regimen due to low blood pressure   . Iron deficiency    hx  . Nephrolithiasis 2002, 2006   bilateral  . Normal coronary arteries 2014   a. LHC in 1/14: normal coronary arteries  . PAF (paroxysmal atrial fibrillation) ([place])    a. 10/ 2013  in setting of Septic Shock //  b. recurrent during admit for pneumonia, L effusion >> placed on Amiodarone // Coumadin for anticoagulation  . Pneumonia jan 2018, last tme lungs clear now   dx 10-06-2014 per CXR--  on 10-27-2014 pt states finished antibiotic and denies cough or fever  .  S/P hemodialysis catheter insertion ([place]) 04/11/2015    right anterior chest , only used once   . Sigmoid diverticulosis   . UTI (urinary tract infection) 05/10/2016    Past Surgical History:  Procedure Laterality Date  . AV FISTULA PLACEMENT Left 06/02/2015   Procedure: BRACHIOCEPHALIC ARTERIOVENOUS (AV) FISTULA CREATION ;  Surgeon: Conrad Brent, MD;  Location: [place];  Service: Vascular;  Laterality: Left;  . BASCILIC VEIN TRANSPOSITION Left 07/27/2015   Procedure: FIRST STAGE BASILIC VEIN TRANSPOSITION LEFT UPPER ARM;  Surgeon: Conrad [place], MD;  Location: [place];  Service: Vascular;  Laterality: Left;  . BASCILIC VEIN TRANSPOSITION Left 09/2015   second phase  . BASCILIC VEIN TRANSPOSITION Left 10/12/2015   Procedure:  SECOND STAGE BASILIC VEIN TRANSPOSITION LEFT ARM;  Surgeon: Conrad Chesterbrook, MD;  Location: [place];  Service: Vascular;  Laterality: Left;  . BREAST BIOPSY Left 08/23/07   benign fibrocystic with duct ectasia  . CARDIAC CATHETERIZATION  07-11-2012  dr Irish Lack   Abnormal stress test/   normal coronary arteries/  LVEDP  74mmHg  . CARDIOVASCULAR STRESS TEST  06-26-2012  dr Irish Lack   marked ischemia in the basal anterior, mid anterior, apical inferior regions/  normal LVF, ef 63%  . CATARACT EXTRACTION W/ INTRAOCULAR LENS  IMPLANT, BILATERAL    . COLONOSCOPY WITH PROPOFOL N/A 10/17/2016   Procedure: COLONOSCOPY WITH PROPOFOL;  Surgeon: Garlan Fair, MD;  Location: [facility];  Service: Endoscopy;  Laterality: N/A;  . CYSTOSCOPY W/ URETERAL STENT PLACEMENT  04/04/2012   Procedure: CYSTOSCOPY WITH RETROGRADE PYELOGRAM/URETERAL STENT PLACEMENT;  Surgeon: Ailene Rud, MD;  Location: [place];  Service: Urology;  Laterality: Left;  . CYSTOSCOPY W/ URETERAL STENT PLACEMENT Bilateral 05/04/2015   Procedure: CYSTOSCOPY WITH BILATERAL RETROGRADE PYELOGRAM/ WITH INTERPRETATION, EXCHANGE OF RIGHT URETERAL STENT REPLACEMENT AND PLACEMENT LEFT URETERAL STENT PLACEMENT EXAMINATION OF VAGINA;  Surgeon: Carolan Clines, MD;  Location: [facility];  Service: Urology;  Laterality: Bilateral;  . CYSTOSCOPY WITH STENT PLACEMENT Right 10/28/2014   Procedure: RIGHT URETERAL STENT PLACEMENT;  Surgeon: Irine Seal, MD;  Location[facility];  Service: Urology;  Laterality: Right;  . CYSTOSCOPY WITH STENT PLACEMENT Right 02/26/2015   Procedure: CYSTOSCOPY RETROGRADE PYELOGRAM WITH STENT PLACEMENT;  Surgeon: Cleon Gustin, MD;  Location: [facility];  Service: Urology;  Laterality: Right;  . CYSTOSCOPY/RETROGRADE/URETEROSCOPY/STONE EXTRACTION WITH BASKET Right 11/21/2014   Procedure: CYSTOSCOPY/RIGHT RETROGRADE PYELOGRAM/RIGHT URETEROSCOPY/BASKET EXTRACTION/RIGHT PYELOSCOPY/LASER OF STONE/RIGHT DOUBLE J STENT;   Surgeon: Carolan Clines, MD;  Location: [place];  Service: Urology;  Laterality: Right;  . EXTRACORPOREAL SHOCK WAVE LITHOTRIPSY  05-28-2012  &  10-08-2012  . HOLMIUM LASER APPLICATION Right 09/24/960   Procedure: HOLMIUM LASER APPLICATION;  Surgeon: Carolan Clines, MD;  Location: [facility];  Service: Urology;  Laterality: Right;  . IR GENERIC HISTORICAL  02/01/2016   IR NEPHROSTOMY EXCHANGE RIGHT 02/01/2016 Greggory Keen, MD [facility]  . IR GENERIC HISTORICAL  02/24/2016   IR PATIENT EVAL TECH 0-60 MINS 02/24/2016 Aletta Edouard, MD [facility]  . KNEE ARTHROSCOPY Left 02-14-2003  . LAPAROSCOPIC CHOLECYSTECTOMY  03-23-2005  . TOTAL ABDOMINAL HYSTERECTOMY W/ BILATERAL SALPINGOOPHORECTOMY  1993   secondary to fibroids  . TRANSTHORACIC ECHOCARDIOGRAM  04-09-2012   normal LVF,  ef 60-65%,  mild LAE,  mild TR, trivial MR and PR    Current Outpatient Prescriptions  Medication Sig Dispense Refill  . acetaminophen (TYLENOL) 500 MG tablet Take 1,000 mg by mouth every 6 (  six) hours as needed for moderate pain or headache.     Marland Kitchen amiodarone (PACERONE) 200 MG tablet Take 1 tablet (200 mg total) by mouth daily. 30 tablet 11  . bimatoprost (LUMIGAN) 0.01 % SOLN Place 1 drop into both eyes at bedtime.    . calcium acetate (PHOSLO) 667 MG capsule Take 1,334 mg by mouth 3 (three) times daily with meals.     . cephALEXin (KEFLEX) 250 MG capsule Take 250 mg by mouth daily.    . Multiple Vitamins-Minerals (MULTIVITAMIN WITH MINERALS) tablet Take 1 tablet by mouth daily.    . ranitidine (ZANTAC) 150 MG tablet Take 150 mg by mouth 2 (two) times daily as needed for heartburn.    . saccharomyces boulardii (FLORASTOR) 250 MG capsule Take 1 capsule (250 mg total) by mouth 2 (two) times daily. 60 capsule 0  . sodium chloride (OCEAN) 0.65 % SOLN nasal spray Place 1 spray into both nostrils 3 (three) times daily as needed for congestion.    . timolol (BETIMOL) 0.5 %  ophthalmic solution Place 1 drop into both eyes every morning.     . warfarin (COUMADIN) 5 MG tablet TAKE AS DIRECTED BY COUMADIN CLINIC 30 tablet 3  . nystatin cream (MYCOSTATIN) Apply to affected area BID as needed 30 g 2  . triamcinolone (KENALOG) 0.025 % ointment Apply 1 application topically 2 (two) times daily. 30 g 2   No current facility-administered medications for this visit.     Family History  Problem Relation Age of Onset  . Hypertension Mother   . Cancer Mother 32       breast  . Dementia Mother   . Hypertension Brother   . Diabetes Brother   . Heart disease Brother        before age 48  . Cancer Father 64       pancreatic  . Heart failure Paternal Grandmother   . Bladder Cancer Maternal Grandfather     ROS:  Pertinent items are noted in HPI.  Otherwise, a comprehensive ROS was negative.  Exam:   BP 110/64 (BP Location: Right Arm, Patient Position: Sitting, Cuff Size: Large)   Pulse 80   Ht 5' 2.25" (1.581 m)   Wt 190 lb (86.2 kg)   LMP 01/19/1992 (Approximate)   BMI 34.47 kg/m  Height: 5' 2.25" (158.1 cm) Ht Readings from Last 3 Encounters:  10/31/16 5' 2.25" (1.581 m)  10/17/16 5\' 3"  (1.6 m)  07/02/16 5\' 3"  (1.6 m)    General appearance: alert, cooperative and appears stated age Head: Normocephalic, without obvious abnormality, atraumatic Neck: no adenopathy, supple, symmetrical, trachea midline and thyroid normal to inspection and palpation Lungs: clear to auscultation bilaterally Breasts: normal appearance, no masses or tenderness Heart: regular rate and rhythm Abdomen: soft, non-tender; no masses,  no organomegaly Extremities: extremities normal, atraumatic, no cyanosis or edema, CV line in left arm without redness. Skin: Skin color, texture, turgor normal. No rashes or lesions Lymph nodes: Cervical, supraclavicular, and axillary nodes normal. No abnormal inguinal nodes palpated Neurologic: Grossly normal   Pelvic: External genitalia:  no  lesions, but generalizes redness extending to the rectum from external yeast.              Urethra:  normal appearing urethra with no masses, tenderness or lesions              Bartholin's and Skene's: normal  Vagina: atrophic appearing vagina with normal color and no discharge, no lesions              Cervix: absent              Pap taken: No. Bimanual Exam:  Uterus:  uterus absent              Adnexa: no mass, fullness, tenderness               Rectovaginal: Confirms               Anus:  normal sphincter tone, no lesions  Chaperone present: no  A:  Well Woman with normal exam    Postmenopausal on ERT 1993 - fall 2014 S/P TAH/ BSO secondary to fibroids 1993 History of renal calculi with Lithotripsy X 2  Osteopenia of hips Diagnosis of CKD stage IV/V,  CV line and basilic vein transposition left arm for dialysis since 10/2015 gets treatment on T -Th- Sat  Pneumonia X 2 = 11/17 and 06/2016             Yeast vulva vaginitis  - most likely aggravated with use of Keflex daily   P:   Reviewed health and wellness pertinent to exam  Pap smear: no  Mammogram is past due but has been in hospital this year -will schedule  Also note for BMD [facility] to [facility]  Af refill of Triamcinolone and Nystatin is sent to her pharmacy to use topically for external yeast prn  Counseled on breast self exam, mammography screening, adequate intake of calcium and vitamin D, diet and exercise return annually or prn  An After Visit Summary was printed and given to the patient.

## 2016-10-31 NOTE — Patient Instructions (Addendum)

## 2016-11-02 ENCOUNTER — Ambulatory Visit (INDEPENDENT_AMBULATORY_CARE_PROVIDER_SITE_OTHER): Payer: Medicare Other | Admitting: *Deleted

## 2016-11-02 DIAGNOSIS — Z5181 Encounter for therapeutic drug level monitoring: Secondary | ICD-10-CM | POA: Diagnosis not present

## 2016-11-02 DIAGNOSIS — I480 Paroxysmal atrial fibrillation: Secondary | ICD-10-CM | POA: Diagnosis not present

## 2016-11-02 DIAGNOSIS — I4891 Unspecified atrial fibrillation: Secondary | ICD-10-CM

## 2016-11-02 LAB — POCT INR: INR: 2.3

## 2016-11-02 NOTE — Progress Notes (Signed)
Encounter reviewed Milon Dethloff, MD   

## 2016-11-12 IMAGING — DX DG CHEST 2V
2 series · 2 of 2 positions shown · non-contrast
Comparison: Portable chest x-ray November 20, 2015

CLINICAL DATA: Generalized weakness, healthcare associated
pneumonia, left pleural effusion, end-stage renal disease.

EXAM:
CHEST  2 VIEW

[w chest pa]
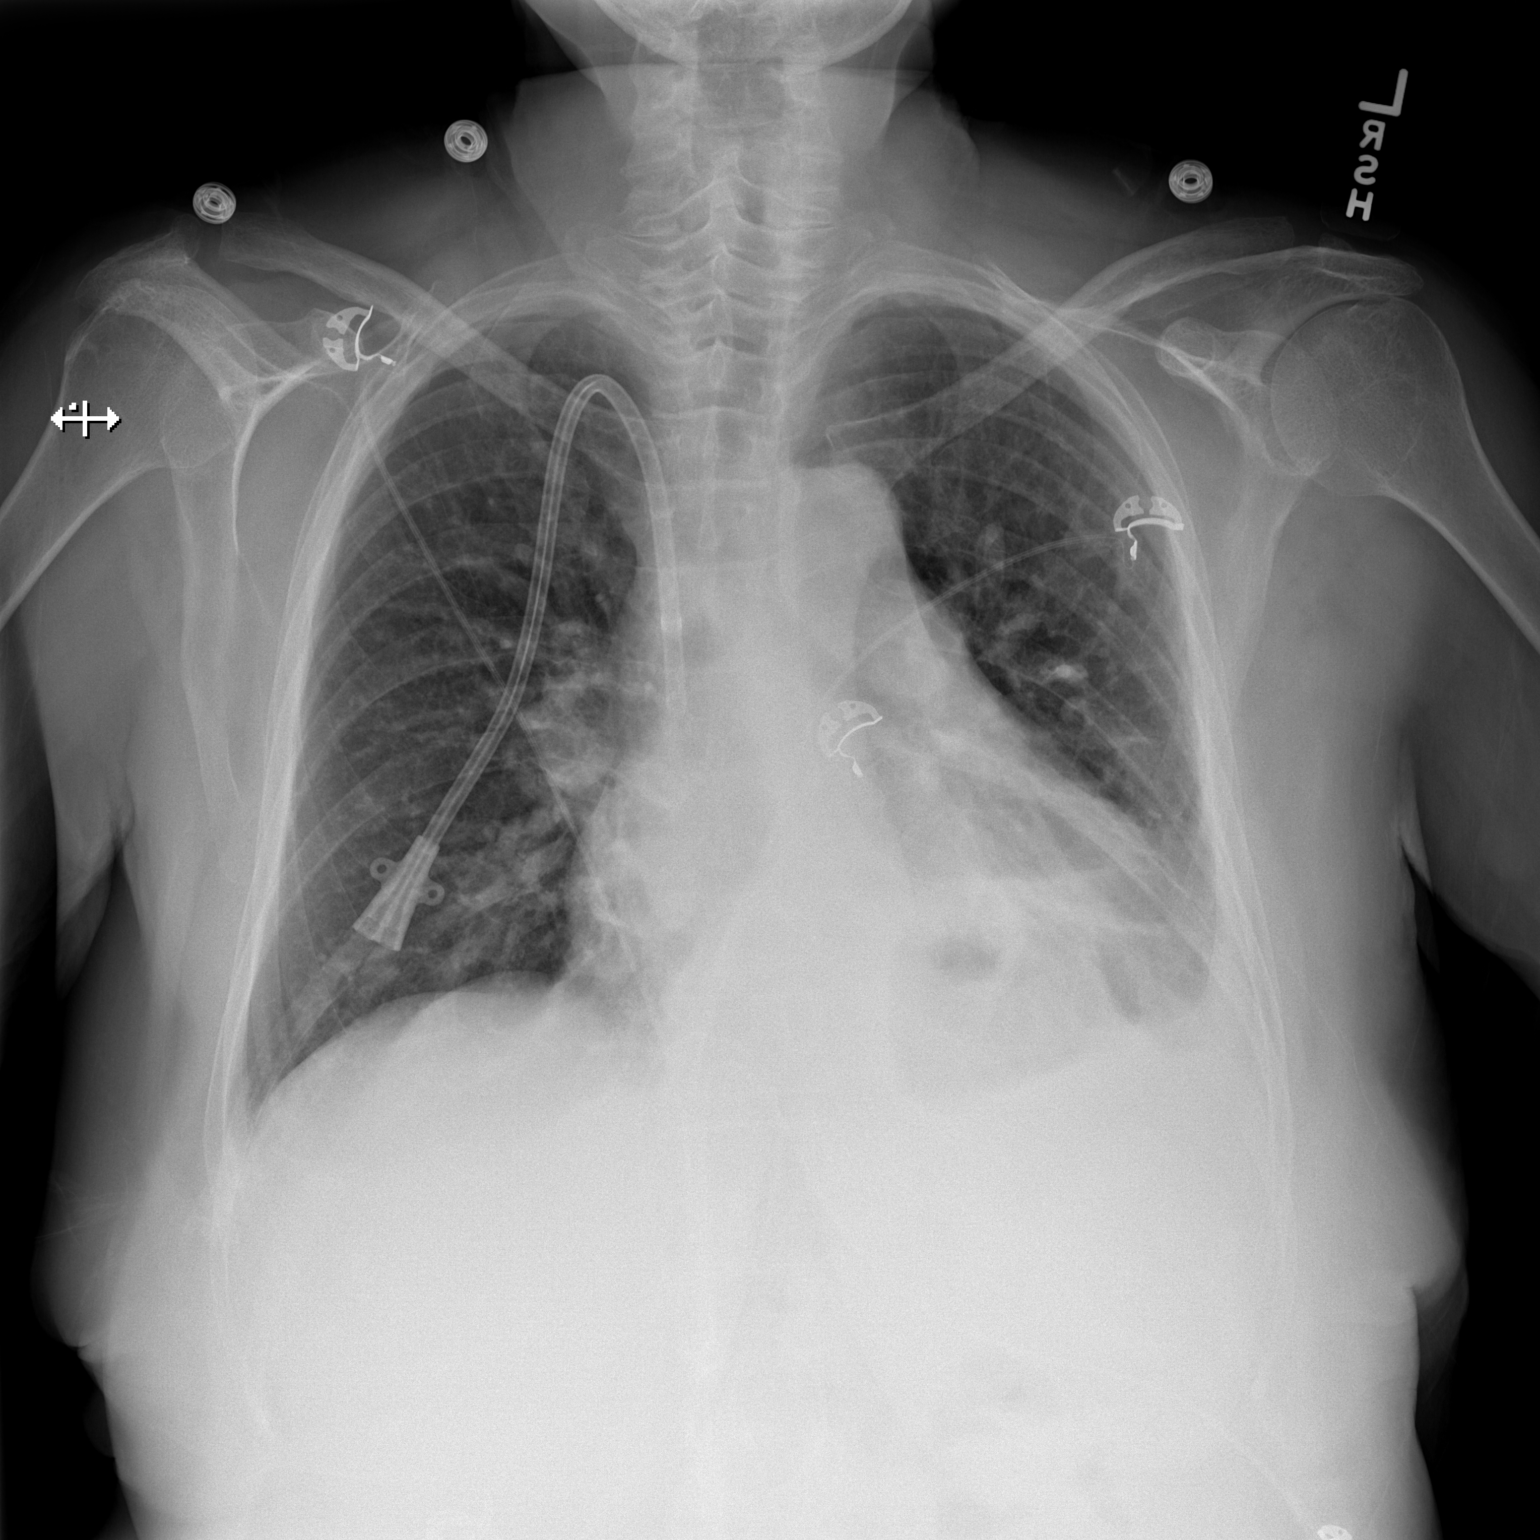

[w chest lat]
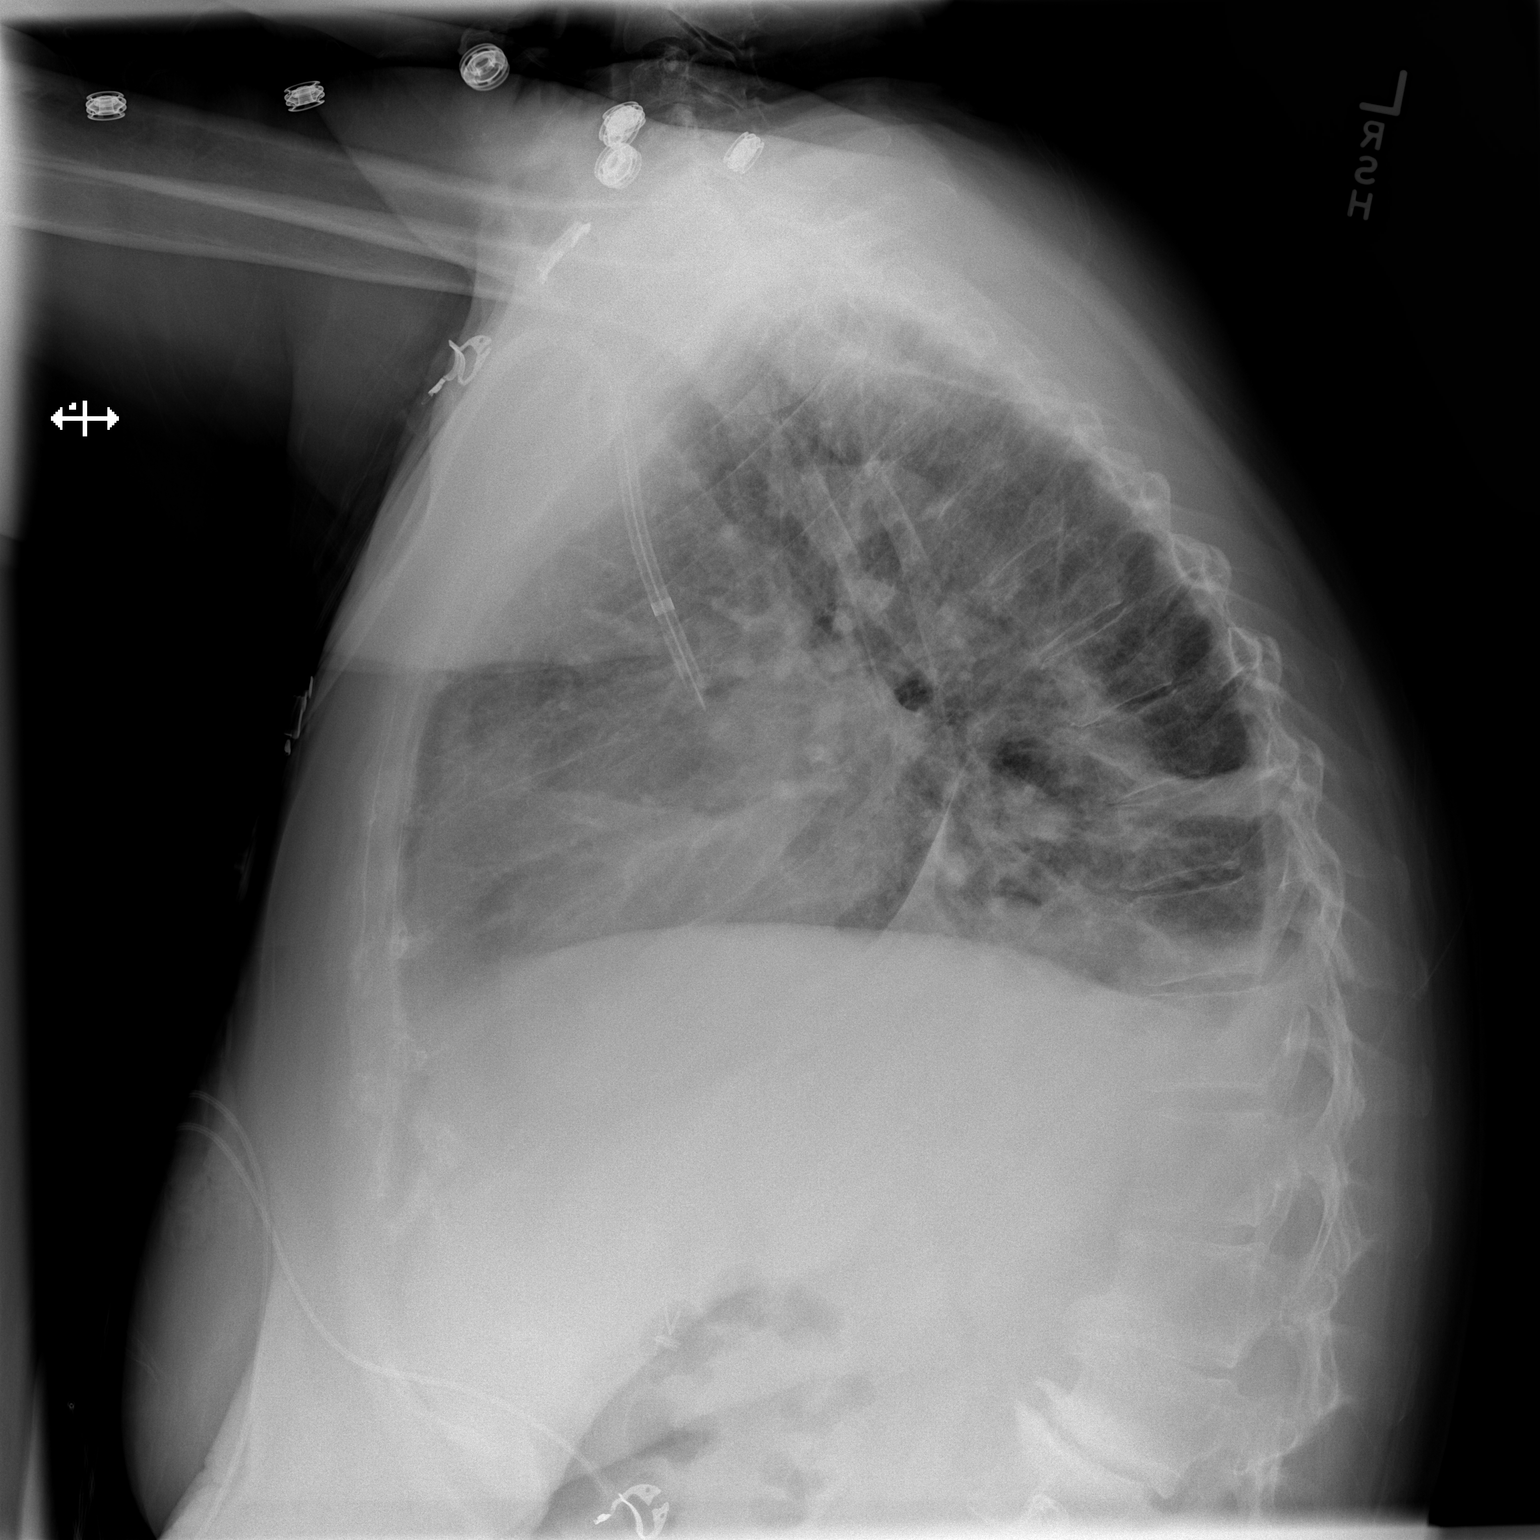

[2 of 2 positions shown; findings below may reference images not displayed]

FINDINGS: There is persistent increased density at the left lung base. The
right lung base is clear. The cardiac silhouette remains enlarged.
The pulmonary vascularity remains mildly engorged. The dual-lumen
dialysis catheter tip projects over the midportion of the SVC.
IMPRESSION: Persistent left basilar atelectasis or pneumonia with small left
pleural effusion. Stable mild cardiomegaly without significant
pulmonary vascular congestion.

## 2016-11-16 ENCOUNTER — Ambulatory Visit (INDEPENDENT_AMBULATORY_CARE_PROVIDER_SITE_OTHER): Payer: Medicare Other | Admitting: *Deleted

## 2016-11-16 DIAGNOSIS — I480 Paroxysmal atrial fibrillation: Secondary | ICD-10-CM

## 2016-11-16 DIAGNOSIS — I4891 Unspecified atrial fibrillation: Secondary | ICD-10-CM | POA: Diagnosis not present

## 2016-11-16 DIAGNOSIS — Z5181 Encounter for therapeutic drug level monitoring: Secondary | ICD-10-CM

## 2016-11-16 LAB — POCT INR: INR: 1.9

## 2016-11-17 DIAGNOSIS — I129 Hypertensive chronic kidney disease with stage 1 through stage 4 chronic kidney disease, or unspecified chronic kidney disease: Secondary | ICD-10-CM | POA: Diagnosis not present

## 2016-11-17 DIAGNOSIS — Z992 Dependence on renal dialysis: Secondary | ICD-10-CM | POA: Diagnosis not present

## 2016-11-17 DIAGNOSIS — N186 End stage renal disease: Secondary | ICD-10-CM | POA: Diagnosis not present

## 2016-11-25 NOTE — Addendum Note (Signed)
Addendum  created 11/25/16 1209 by Lyn Hollingshead, MD   Sign clinical note

## 2016-11-30 ENCOUNTER — Ambulatory Visit (INDEPENDENT_AMBULATORY_CARE_PROVIDER_SITE_OTHER): Payer: Medicare Other | Admitting: *Deleted

## 2016-11-30 DIAGNOSIS — I4891 Unspecified atrial fibrillation: Secondary | ICD-10-CM

## 2016-11-30 DIAGNOSIS — Z5181 Encounter for therapeutic drug level monitoring: Secondary | ICD-10-CM | POA: Diagnosis not present

## 2016-11-30 DIAGNOSIS — I480 Paroxysmal atrial fibrillation: Secondary | ICD-10-CM | POA: Diagnosis not present

## 2016-11-30 LAB — POCT INR: INR: 2.4

## 2016-12-17 DIAGNOSIS — I129 Hypertensive chronic kidney disease with stage 1 through stage 4 chronic kidney disease, or unspecified chronic kidney disease: Secondary | ICD-10-CM | POA: Diagnosis not present

## 2016-12-17 DIAGNOSIS — N186 End stage renal disease: Secondary | ICD-10-CM | POA: Diagnosis not present

## 2016-12-17 DIAGNOSIS — Z992 Dependence on renal dialysis: Secondary | ICD-10-CM | POA: Diagnosis not present

## 2016-12-25 ENCOUNTER — Other Ambulatory Visit: Payer: Self-pay | Admitting: Physician Assistant

## 2016-12-25 DIAGNOSIS — I480 Paroxysmal atrial fibrillation: Secondary | ICD-10-CM

## 2016-12-26 ENCOUNTER — Ambulatory Visit (INDEPENDENT_AMBULATORY_CARE_PROVIDER_SITE_OTHER): Payer: Medicare Other

## 2016-12-26 DIAGNOSIS — I480 Paroxysmal atrial fibrillation: Secondary | ICD-10-CM

## 2016-12-26 DIAGNOSIS — I4891 Unspecified atrial fibrillation: Secondary | ICD-10-CM | POA: Diagnosis not present

## 2016-12-26 DIAGNOSIS — Z5181 Encounter for therapeutic drug level monitoring: Secondary | ICD-10-CM

## 2016-12-26 LAB — POCT INR: INR: 2.2

## 2017-01-09 ENCOUNTER — Other Ambulatory Visit: Payer: Self-pay | Admitting: Interventional Cardiology

## 2017-01-17 DIAGNOSIS — N186 End stage renal disease: Secondary | ICD-10-CM | POA: Diagnosis not present

## 2017-01-17 DIAGNOSIS — Z992 Dependence on renal dialysis: Secondary | ICD-10-CM | POA: Diagnosis not present

## 2017-01-17 DIAGNOSIS — I129 Hypertensive chronic kidney disease with stage 1 through stage 4 chronic kidney disease, or unspecified chronic kidney disease: Secondary | ICD-10-CM | POA: Diagnosis not present

## 2017-01-19 DIAGNOSIS — R51 Headache: Secondary | ICD-10-CM | POA: Diagnosis not present

## 2017-01-19 DIAGNOSIS — D688 Other specified coagulation defects: Secondary | ICD-10-CM | POA: Diagnosis not present

## 2017-01-19 DIAGNOSIS — I482 Chronic atrial fibrillation: Secondary | ICD-10-CM | POA: Diagnosis not present

## 2017-01-19 DIAGNOSIS — N2581 Secondary hyperparathyroidism of renal origin: Secondary | ICD-10-CM | POA: Diagnosis not present

## 2017-01-19 DIAGNOSIS — N186 End stage renal disease: Secondary | ICD-10-CM | POA: Diagnosis not present

## 2017-01-19 DIAGNOSIS — D509 Iron deficiency anemia, unspecified: Secondary | ICD-10-CM | POA: Diagnosis not present

## 2017-01-20 ENCOUNTER — Telehealth: Payer: Self-pay | Admitting: Obstetrics and Gynecology

## 2017-01-20 NOTE — Telephone Encounter (Signed)
Left message on voicemail to call and reschedule cancelled appointment. Mail letter

## 2017-01-21 DIAGNOSIS — D509 Iron deficiency anemia, unspecified: Secondary | ICD-10-CM | POA: Diagnosis not present

## 2017-01-21 DIAGNOSIS — I482 Chronic atrial fibrillation: Secondary | ICD-10-CM | POA: Diagnosis not present

## 2017-01-21 DIAGNOSIS — R51 Headache: Secondary | ICD-10-CM | POA: Diagnosis not present

## 2017-01-21 DIAGNOSIS — N186 End stage renal disease: Secondary | ICD-10-CM | POA: Diagnosis not present

## 2017-01-21 DIAGNOSIS — D688 Other specified coagulation defects: Secondary | ICD-10-CM | POA: Diagnosis not present

## 2017-01-21 DIAGNOSIS — N2581 Secondary hyperparathyroidism of renal origin: Secondary | ICD-10-CM | POA: Diagnosis not present

## 2017-01-23 ENCOUNTER — Ambulatory Visit (INDEPENDENT_AMBULATORY_CARE_PROVIDER_SITE_OTHER): Payer: Medicare Other

## 2017-01-23 DIAGNOSIS — I480 Paroxysmal atrial fibrillation: Secondary | ICD-10-CM | POA: Diagnosis not present

## 2017-01-23 DIAGNOSIS — Z5181 Encounter for therapeutic drug level monitoring: Secondary | ICD-10-CM

## 2017-01-23 DIAGNOSIS — I4891 Unspecified atrial fibrillation: Secondary | ICD-10-CM | POA: Diagnosis not present

## 2017-01-23 LAB — POCT INR: INR: 2.8

## 2017-01-24 DIAGNOSIS — R51 Headache: Secondary | ICD-10-CM | POA: Diagnosis not present

## 2017-01-24 DIAGNOSIS — D509 Iron deficiency anemia, unspecified: Secondary | ICD-10-CM | POA: Diagnosis not present

## 2017-01-24 DIAGNOSIS — N186 End stage renal disease: Secondary | ICD-10-CM | POA: Diagnosis not present

## 2017-01-24 DIAGNOSIS — I482 Chronic atrial fibrillation: Secondary | ICD-10-CM | POA: Diagnosis not present

## 2017-01-24 DIAGNOSIS — D688 Other specified coagulation defects: Secondary | ICD-10-CM | POA: Diagnosis not present

## 2017-01-24 DIAGNOSIS — N2581 Secondary hyperparathyroidism of renal origin: Secondary | ICD-10-CM | POA: Diagnosis not present

## 2017-01-26 DIAGNOSIS — D688 Other specified coagulation defects: Secondary | ICD-10-CM | POA: Diagnosis not present

## 2017-01-26 DIAGNOSIS — D509 Iron deficiency anemia, unspecified: Secondary | ICD-10-CM | POA: Diagnosis not present

## 2017-01-26 DIAGNOSIS — N2581 Secondary hyperparathyroidism of renal origin: Secondary | ICD-10-CM | POA: Diagnosis not present

## 2017-01-26 DIAGNOSIS — R51 Headache: Secondary | ICD-10-CM | POA: Diagnosis not present

## 2017-01-26 DIAGNOSIS — I482 Chronic atrial fibrillation: Secondary | ICD-10-CM | POA: Diagnosis not present

## 2017-01-26 DIAGNOSIS — N186 End stage renal disease: Secondary | ICD-10-CM | POA: Diagnosis not present

## 2017-01-28 DIAGNOSIS — N186 End stage renal disease: Secondary | ICD-10-CM | POA: Diagnosis not present

## 2017-01-28 DIAGNOSIS — D509 Iron deficiency anemia, unspecified: Secondary | ICD-10-CM | POA: Diagnosis not present

## 2017-01-28 DIAGNOSIS — R51 Headache: Secondary | ICD-10-CM | POA: Diagnosis not present

## 2017-01-28 DIAGNOSIS — N2581 Secondary hyperparathyroidism of renal origin: Secondary | ICD-10-CM | POA: Diagnosis not present

## 2017-01-28 DIAGNOSIS — I482 Chronic atrial fibrillation: Secondary | ICD-10-CM | POA: Diagnosis not present

## 2017-01-28 DIAGNOSIS — D688 Other specified coagulation defects: Secondary | ICD-10-CM | POA: Diagnosis not present

## 2017-01-31 DIAGNOSIS — N186 End stage renal disease: Secondary | ICD-10-CM | POA: Diagnosis not present

## 2017-01-31 DIAGNOSIS — D509 Iron deficiency anemia, unspecified: Secondary | ICD-10-CM | POA: Diagnosis not present

## 2017-01-31 DIAGNOSIS — I482 Chronic atrial fibrillation: Secondary | ICD-10-CM | POA: Diagnosis not present

## 2017-01-31 DIAGNOSIS — R51 Headache: Secondary | ICD-10-CM | POA: Diagnosis not present

## 2017-01-31 DIAGNOSIS — D688 Other specified coagulation defects: Secondary | ICD-10-CM | POA: Diagnosis not present

## 2017-01-31 DIAGNOSIS — N2581 Secondary hyperparathyroidism of renal origin: Secondary | ICD-10-CM | POA: Diagnosis not present

## 2017-02-02 DIAGNOSIS — I482 Chronic atrial fibrillation: Secondary | ICD-10-CM | POA: Diagnosis not present

## 2017-02-02 DIAGNOSIS — D509 Iron deficiency anemia, unspecified: Secondary | ICD-10-CM | POA: Diagnosis not present

## 2017-02-02 DIAGNOSIS — R51 Headache: Secondary | ICD-10-CM | POA: Diagnosis not present

## 2017-02-02 DIAGNOSIS — D688 Other specified coagulation defects: Secondary | ICD-10-CM | POA: Diagnosis not present

## 2017-02-02 DIAGNOSIS — N2581 Secondary hyperparathyroidism of renal origin: Secondary | ICD-10-CM | POA: Diagnosis not present

## 2017-02-02 DIAGNOSIS — N186 End stage renal disease: Secondary | ICD-10-CM | POA: Diagnosis not present

## 2017-02-03 DIAGNOSIS — R310 Gross hematuria: Secondary | ICD-10-CM | POA: Diagnosis not present

## 2017-02-03 DIAGNOSIS — K219 Gastro-esophageal reflux disease without esophagitis: Secondary | ICD-10-CM | POA: Diagnosis not present

## 2017-02-03 DIAGNOSIS — Z0000 Encounter for general adult medical examination without abnormal findings: Secondary | ICD-10-CM | POA: Diagnosis not present

## 2017-02-03 DIAGNOSIS — I129 Hypertensive chronic kidney disease with stage 1 through stage 4 chronic kidney disease, or unspecified chronic kidney disease: Secondary | ICD-10-CM | POA: Diagnosis not present

## 2017-02-03 DIAGNOSIS — E781 Pure hyperglyceridemia: Secondary | ICD-10-CM | POA: Diagnosis not present

## 2017-02-04 DIAGNOSIS — N186 End stage renal disease: Secondary | ICD-10-CM | POA: Diagnosis not present

## 2017-02-04 DIAGNOSIS — R51 Headache: Secondary | ICD-10-CM | POA: Diagnosis not present

## 2017-02-04 DIAGNOSIS — I482 Chronic atrial fibrillation: Secondary | ICD-10-CM | POA: Diagnosis not present

## 2017-02-04 DIAGNOSIS — D509 Iron deficiency anemia, unspecified: Secondary | ICD-10-CM | POA: Diagnosis not present

## 2017-02-04 DIAGNOSIS — N2581 Secondary hyperparathyroidism of renal origin: Secondary | ICD-10-CM | POA: Diagnosis not present

## 2017-02-04 DIAGNOSIS — D688 Other specified coagulation defects: Secondary | ICD-10-CM | POA: Diagnosis not present

## 2017-02-06 ENCOUNTER — Other Ambulatory Visit: Payer: Self-pay | Admitting: Geriatric Medicine

## 2017-02-06 DIAGNOSIS — R011 Cardiac murmur, unspecified: Secondary | ICD-10-CM

## 2017-02-07 DIAGNOSIS — N2581 Secondary hyperparathyroidism of renal origin: Secondary | ICD-10-CM | POA: Diagnosis not present

## 2017-02-07 DIAGNOSIS — D509 Iron deficiency anemia, unspecified: Secondary | ICD-10-CM | POA: Diagnosis not present

## 2017-02-07 DIAGNOSIS — N186 End stage renal disease: Secondary | ICD-10-CM | POA: Diagnosis not present

## 2017-02-07 DIAGNOSIS — R51 Headache: Secondary | ICD-10-CM | POA: Diagnosis not present

## 2017-02-07 DIAGNOSIS — I482 Chronic atrial fibrillation: Secondary | ICD-10-CM | POA: Diagnosis not present

## 2017-02-07 DIAGNOSIS — D688 Other specified coagulation defects: Secondary | ICD-10-CM | POA: Diagnosis not present

## 2017-02-09 DIAGNOSIS — I482 Chronic atrial fibrillation: Secondary | ICD-10-CM | POA: Diagnosis not present

## 2017-02-09 DIAGNOSIS — N186 End stage renal disease: Secondary | ICD-10-CM | POA: Diagnosis not present

## 2017-02-09 DIAGNOSIS — D688 Other specified coagulation defects: Secondary | ICD-10-CM | POA: Diagnosis not present

## 2017-02-09 DIAGNOSIS — N2581 Secondary hyperparathyroidism of renal origin: Secondary | ICD-10-CM | POA: Diagnosis not present

## 2017-02-09 DIAGNOSIS — R51 Headache: Secondary | ICD-10-CM | POA: Diagnosis not present

## 2017-02-09 DIAGNOSIS — D509 Iron deficiency anemia, unspecified: Secondary | ICD-10-CM | POA: Diagnosis not present

## 2017-02-11 DIAGNOSIS — R51 Headache: Secondary | ICD-10-CM | POA: Diagnosis not present

## 2017-02-11 DIAGNOSIS — D509 Iron deficiency anemia, unspecified: Secondary | ICD-10-CM | POA: Diagnosis not present

## 2017-02-11 DIAGNOSIS — N186 End stage renal disease: Secondary | ICD-10-CM | POA: Diagnosis not present

## 2017-02-11 DIAGNOSIS — D688 Other specified coagulation defects: Secondary | ICD-10-CM | POA: Diagnosis not present

## 2017-02-11 DIAGNOSIS — I482 Chronic atrial fibrillation: Secondary | ICD-10-CM | POA: Diagnosis not present

## 2017-02-11 DIAGNOSIS — N2581 Secondary hyperparathyroidism of renal origin: Secondary | ICD-10-CM | POA: Diagnosis not present

## 2017-02-14 DIAGNOSIS — N2581 Secondary hyperparathyroidism of renal origin: Secondary | ICD-10-CM | POA: Diagnosis not present

## 2017-02-14 DIAGNOSIS — D509 Iron deficiency anemia, unspecified: Secondary | ICD-10-CM | POA: Diagnosis not present

## 2017-02-14 DIAGNOSIS — D688 Other specified coagulation defects: Secondary | ICD-10-CM | POA: Diagnosis not present

## 2017-02-14 DIAGNOSIS — I482 Chronic atrial fibrillation: Secondary | ICD-10-CM | POA: Diagnosis not present

## 2017-02-14 DIAGNOSIS — N186 End stage renal disease: Secondary | ICD-10-CM | POA: Diagnosis not present

## 2017-02-14 DIAGNOSIS — R51 Headache: Secondary | ICD-10-CM | POA: Diagnosis not present

## 2017-02-16 DIAGNOSIS — R51 Headache: Secondary | ICD-10-CM | POA: Diagnosis not present

## 2017-02-16 DIAGNOSIS — D688 Other specified coagulation defects: Secondary | ICD-10-CM | POA: Diagnosis not present

## 2017-02-16 DIAGNOSIS — N2581 Secondary hyperparathyroidism of renal origin: Secondary | ICD-10-CM | POA: Diagnosis not present

## 2017-02-16 DIAGNOSIS — N186 End stage renal disease: Secondary | ICD-10-CM | POA: Diagnosis not present

## 2017-02-16 DIAGNOSIS — I482 Chronic atrial fibrillation: Secondary | ICD-10-CM | POA: Diagnosis not present

## 2017-02-16 DIAGNOSIS — D509 Iron deficiency anemia, unspecified: Secondary | ICD-10-CM | POA: Diagnosis not present

## 2017-02-17 DIAGNOSIS — Z992 Dependence on renal dialysis: Secondary | ICD-10-CM | POA: Diagnosis not present

## 2017-02-17 DIAGNOSIS — N186 End stage renal disease: Secondary | ICD-10-CM | POA: Diagnosis not present

## 2017-02-17 DIAGNOSIS — I129 Hypertensive chronic kidney disease with stage 1 through stage 4 chronic kidney disease, or unspecified chronic kidney disease: Secondary | ICD-10-CM | POA: Diagnosis not present

## 2017-02-18 DIAGNOSIS — N2581 Secondary hyperparathyroidism of renal origin: Secondary | ICD-10-CM | POA: Diagnosis not present

## 2017-02-18 DIAGNOSIS — N186 End stage renal disease: Secondary | ICD-10-CM | POA: Diagnosis not present

## 2017-02-18 DIAGNOSIS — D688 Other specified coagulation defects: Secondary | ICD-10-CM | POA: Diagnosis not present

## 2017-02-18 DIAGNOSIS — D509 Iron deficiency anemia, unspecified: Secondary | ICD-10-CM | POA: Diagnosis not present

## 2017-02-18 DIAGNOSIS — Z5181 Encounter for therapeutic drug level monitoring: Secondary | ICD-10-CM | POA: Diagnosis not present

## 2017-02-18 DIAGNOSIS — E039 Hypothyroidism, unspecified: Secondary | ICD-10-CM | POA: Diagnosis not present

## 2017-02-18 DIAGNOSIS — D631 Anemia in chronic kidney disease: Secondary | ICD-10-CM | POA: Diagnosis not present

## 2017-02-18 DIAGNOSIS — R51 Headache: Secondary | ICD-10-CM | POA: Diagnosis not present

## 2017-02-18 DIAGNOSIS — I482 Chronic atrial fibrillation: Secondary | ICD-10-CM | POA: Diagnosis not present

## 2017-02-21 DIAGNOSIS — Z5181 Encounter for therapeutic drug level monitoring: Secondary | ICD-10-CM | POA: Diagnosis not present

## 2017-02-21 DIAGNOSIS — D631 Anemia in chronic kidney disease: Secondary | ICD-10-CM | POA: Diagnosis not present

## 2017-02-21 DIAGNOSIS — I482 Chronic atrial fibrillation: Secondary | ICD-10-CM | POA: Diagnosis not present

## 2017-02-21 DIAGNOSIS — E039 Hypothyroidism, unspecified: Secondary | ICD-10-CM | POA: Diagnosis not present

## 2017-02-21 DIAGNOSIS — N2581 Secondary hyperparathyroidism of renal origin: Secondary | ICD-10-CM | POA: Diagnosis not present

## 2017-02-21 DIAGNOSIS — R51 Headache: Secondary | ICD-10-CM | POA: Diagnosis not present

## 2017-02-21 DIAGNOSIS — N186 End stage renal disease: Secondary | ICD-10-CM | POA: Diagnosis not present

## 2017-02-21 DIAGNOSIS — D688 Other specified coagulation defects: Secondary | ICD-10-CM | POA: Diagnosis not present

## 2017-02-21 DIAGNOSIS — D509 Iron deficiency anemia, unspecified: Secondary | ICD-10-CM | POA: Diagnosis not present

## 2017-02-23 DIAGNOSIS — R51 Headache: Secondary | ICD-10-CM | POA: Diagnosis not present

## 2017-02-23 DIAGNOSIS — E039 Hypothyroidism, unspecified: Secondary | ICD-10-CM | POA: Diagnosis not present

## 2017-02-23 DIAGNOSIS — N186 End stage renal disease: Secondary | ICD-10-CM | POA: Diagnosis not present

## 2017-02-23 DIAGNOSIS — D631 Anemia in chronic kidney disease: Secondary | ICD-10-CM | POA: Diagnosis not present

## 2017-02-23 DIAGNOSIS — I482 Chronic atrial fibrillation: Secondary | ICD-10-CM | POA: Diagnosis not present

## 2017-02-23 DIAGNOSIS — N2581 Secondary hyperparathyroidism of renal origin: Secondary | ICD-10-CM | POA: Diagnosis not present

## 2017-02-23 DIAGNOSIS — D688 Other specified coagulation defects: Secondary | ICD-10-CM | POA: Diagnosis not present

## 2017-02-23 DIAGNOSIS — D509 Iron deficiency anemia, unspecified: Secondary | ICD-10-CM | POA: Diagnosis not present

## 2017-02-23 DIAGNOSIS — Z5181 Encounter for therapeutic drug level monitoring: Secondary | ICD-10-CM | POA: Diagnosis not present

## 2017-02-25 DIAGNOSIS — E039 Hypothyroidism, unspecified: Secondary | ICD-10-CM | POA: Diagnosis not present

## 2017-02-25 DIAGNOSIS — N2581 Secondary hyperparathyroidism of renal origin: Secondary | ICD-10-CM | POA: Diagnosis not present

## 2017-02-25 DIAGNOSIS — I482 Chronic atrial fibrillation: Secondary | ICD-10-CM | POA: Diagnosis not present

## 2017-02-25 DIAGNOSIS — D688 Other specified coagulation defects: Secondary | ICD-10-CM | POA: Diagnosis not present

## 2017-02-25 DIAGNOSIS — R51 Headache: Secondary | ICD-10-CM | POA: Diagnosis not present

## 2017-02-25 DIAGNOSIS — N186 End stage renal disease: Secondary | ICD-10-CM | POA: Diagnosis not present

## 2017-02-25 DIAGNOSIS — Z5181 Encounter for therapeutic drug level monitoring: Secondary | ICD-10-CM | POA: Diagnosis not present

## 2017-02-25 DIAGNOSIS — D509 Iron deficiency anemia, unspecified: Secondary | ICD-10-CM | POA: Diagnosis not present

## 2017-02-25 DIAGNOSIS — D631 Anemia in chronic kidney disease: Secondary | ICD-10-CM | POA: Diagnosis not present

## 2017-02-28 DIAGNOSIS — N186 End stage renal disease: Secondary | ICD-10-CM | POA: Diagnosis not present

## 2017-02-28 DIAGNOSIS — D688 Other specified coagulation defects: Secondary | ICD-10-CM | POA: Diagnosis not present

## 2017-02-28 DIAGNOSIS — D631 Anemia in chronic kidney disease: Secondary | ICD-10-CM | POA: Diagnosis not present

## 2017-02-28 DIAGNOSIS — Z5181 Encounter for therapeutic drug level monitoring: Secondary | ICD-10-CM | POA: Diagnosis not present

## 2017-02-28 DIAGNOSIS — D509 Iron deficiency anemia, unspecified: Secondary | ICD-10-CM | POA: Diagnosis not present

## 2017-02-28 DIAGNOSIS — E039 Hypothyroidism, unspecified: Secondary | ICD-10-CM | POA: Diagnosis not present

## 2017-02-28 DIAGNOSIS — R51 Headache: Secondary | ICD-10-CM | POA: Diagnosis not present

## 2017-02-28 DIAGNOSIS — I482 Chronic atrial fibrillation: Secondary | ICD-10-CM | POA: Diagnosis not present

## 2017-02-28 DIAGNOSIS — N2581 Secondary hyperparathyroidism of renal origin: Secondary | ICD-10-CM | POA: Diagnosis not present

## 2017-03-02 DIAGNOSIS — R51 Headache: Secondary | ICD-10-CM | POA: Diagnosis not present

## 2017-03-02 DIAGNOSIS — N186 End stage renal disease: Secondary | ICD-10-CM | POA: Diagnosis not present

## 2017-03-02 DIAGNOSIS — Z5181 Encounter for therapeutic drug level monitoring: Secondary | ICD-10-CM | POA: Diagnosis not present

## 2017-03-02 DIAGNOSIS — N2581 Secondary hyperparathyroidism of renal origin: Secondary | ICD-10-CM | POA: Diagnosis not present

## 2017-03-02 DIAGNOSIS — D631 Anemia in chronic kidney disease: Secondary | ICD-10-CM | POA: Diagnosis not present

## 2017-03-02 DIAGNOSIS — I482 Chronic atrial fibrillation: Secondary | ICD-10-CM | POA: Diagnosis not present

## 2017-03-02 DIAGNOSIS — D688 Other specified coagulation defects: Secondary | ICD-10-CM | POA: Diagnosis not present

## 2017-03-02 DIAGNOSIS — D509 Iron deficiency anemia, unspecified: Secondary | ICD-10-CM | POA: Diagnosis not present

## 2017-03-02 DIAGNOSIS — E039 Hypothyroidism, unspecified: Secondary | ICD-10-CM | POA: Diagnosis not present

## 2017-03-04 DIAGNOSIS — I482 Chronic atrial fibrillation: Secondary | ICD-10-CM | POA: Diagnosis not present

## 2017-03-04 DIAGNOSIS — R51 Headache: Secondary | ICD-10-CM | POA: Diagnosis not present

## 2017-03-04 DIAGNOSIS — E039 Hypothyroidism, unspecified: Secondary | ICD-10-CM | POA: Diagnosis not present

## 2017-03-04 DIAGNOSIS — D631 Anemia in chronic kidney disease: Secondary | ICD-10-CM | POA: Diagnosis not present

## 2017-03-04 DIAGNOSIS — N186 End stage renal disease: Secondary | ICD-10-CM | POA: Diagnosis not present

## 2017-03-04 DIAGNOSIS — N2581 Secondary hyperparathyroidism of renal origin: Secondary | ICD-10-CM | POA: Diagnosis not present

## 2017-03-04 DIAGNOSIS — D509 Iron deficiency anemia, unspecified: Secondary | ICD-10-CM | POA: Diagnosis not present

## 2017-03-04 DIAGNOSIS — D688 Other specified coagulation defects: Secondary | ICD-10-CM | POA: Diagnosis not present

## 2017-03-04 DIAGNOSIS — Z5181 Encounter for therapeutic drug level monitoring: Secondary | ICD-10-CM | POA: Diagnosis not present

## 2017-03-06 DIAGNOSIS — I482 Chronic atrial fibrillation: Secondary | ICD-10-CM | POA: Diagnosis not present

## 2017-03-06 DIAGNOSIS — D509 Iron deficiency anemia, unspecified: Secondary | ICD-10-CM | POA: Diagnosis not present

## 2017-03-06 DIAGNOSIS — Z5181 Encounter for therapeutic drug level monitoring: Secondary | ICD-10-CM | POA: Diagnosis not present

## 2017-03-06 DIAGNOSIS — N186 End stage renal disease: Secondary | ICD-10-CM | POA: Diagnosis not present

## 2017-03-06 DIAGNOSIS — N2581 Secondary hyperparathyroidism of renal origin: Secondary | ICD-10-CM | POA: Diagnosis not present

## 2017-03-06 DIAGNOSIS — D631 Anemia in chronic kidney disease: Secondary | ICD-10-CM | POA: Diagnosis not present

## 2017-03-06 DIAGNOSIS — D688 Other specified coagulation defects: Secondary | ICD-10-CM | POA: Diagnosis not present

## 2017-03-06 DIAGNOSIS — R51 Headache: Secondary | ICD-10-CM | POA: Diagnosis not present

## 2017-03-06 DIAGNOSIS — E039 Hypothyroidism, unspecified: Secondary | ICD-10-CM | POA: Diagnosis not present

## 2017-03-07 ENCOUNTER — Ambulatory Visit (INDEPENDENT_AMBULATORY_CARE_PROVIDER_SITE_OTHER): Payer: Medicare Other | Admitting: Interventional Cardiology

## 2017-03-07 ENCOUNTER — Ambulatory Visit (INDEPENDENT_AMBULATORY_CARE_PROVIDER_SITE_OTHER): Payer: Medicare Other | Admitting: *Deleted

## 2017-03-07 ENCOUNTER — Encounter: Payer: Self-pay | Admitting: Interventional Cardiology

## 2017-03-07 ENCOUNTER — Ambulatory Visit (HOSPITAL_COMMUNITY): Payer: Medicare Other | Attending: Cardiology

## 2017-03-07 ENCOUNTER — Other Ambulatory Visit: Payer: Self-pay

## 2017-03-07 ENCOUNTER — Other Ambulatory Visit: Payer: Self-pay | Admitting: *Deleted

## 2017-03-07 VITALS — BP 122/72 | HR 77 | Ht 62.0 in | Wt 194.6 lb

## 2017-03-07 DIAGNOSIS — N186 End stage renal disease: Secondary | ICD-10-CM | POA: Diagnosis not present

## 2017-03-07 DIAGNOSIS — I480 Paroxysmal atrial fibrillation: Secondary | ICD-10-CM | POA: Diagnosis not present

## 2017-03-07 DIAGNOSIS — I10 Essential (primary) hypertension: Secondary | ICD-10-CM | POA: Diagnosis not present

## 2017-03-07 DIAGNOSIS — I350 Nonrheumatic aortic (valve) stenosis: Secondary | ICD-10-CM | POA: Insufficient documentation

## 2017-03-07 DIAGNOSIS — I132 Hypertensive heart and chronic kidney disease with heart failure and with stage 5 chronic kidney disease, or end stage renal disease: Secondary | ICD-10-CM | POA: Insufficient documentation

## 2017-03-07 DIAGNOSIS — Z8249 Family history of ischemic heart disease and other diseases of the circulatory system: Secondary | ICD-10-CM | POA: Insufficient documentation

## 2017-03-07 DIAGNOSIS — Z5181 Encounter for therapeutic drug level monitoring: Secondary | ICD-10-CM

## 2017-03-07 DIAGNOSIS — I509 Heart failure, unspecified: Secondary | ICD-10-CM | POA: Diagnosis not present

## 2017-03-07 DIAGNOSIS — R011 Cardiac murmur, unspecified: Secondary | ICD-10-CM

## 2017-03-07 DIAGNOSIS — I4891 Unspecified atrial fibrillation: Secondary | ICD-10-CM | POA: Insufficient documentation

## 2017-03-07 DIAGNOSIS — E785 Hyperlipidemia, unspecified: Secondary | ICD-10-CM | POA: Insufficient documentation

## 2017-03-07 LAB — POCT INR: INR: 2.7

## 2017-03-07 MED ORDER — AMIODARONE HCL 200 MG PO TABS: 200.0000 mg | ORAL_TABLET | Freq: Every day | ORAL | 11 refills | Status: DC

## 2017-03-07 MED ORDER — WARFARIN SODIUM 5 MG PO TABS: ORAL_TABLET | ORAL | 4 refills | Status: DC

## 2017-03-07 NOTE — Progress Notes (Addendum)
Cardiology Office Note   Date:  03/07/2017   ID:  [name], DOB [birth year], MRN [id number]  PCP:  Lajean Manes, MD    No chief complaint on file. f/u PAF   Wt Readings from Last 3 Encounters:  03/07/17 194 lb 9.6 oz (88.3 kg)  10/31/16 190 lb (86.2 kg)  10/17/16 185 lb (83.9 kg)       History of Present Illness: [name] is a 71 y.o. female  with a hx of Dilated cardiomyopathy with EF 20-25% in the setting of sepsis in 7741 complicated by demand ischemia and transient atrial fibrillation. She had an abnormal stress test that prompted cardiac catheterization which demonstrated normal coronary arteries in 2014. Other history includes HL, HTN, diastolic HF, ESRD on hemodialysis, recurrent sepsis status post percutaneous nephrostomy, chronic anemia.  She progressed to ESRD in 2017.  Admitted 6/2-11/23/15 with chest pain. CXR showed moderate L pleural effusion and trace R pleural effusion new from prior. CT without contrast showed left lung base consolidation with layering pleural effusion favored to represent pneumonia, with small but intermediate density pericardial effusion new since November (consider pericarditis), chronic large gastric hiatal hernia, calcified coronary atherosclerosis. Albumin was 2.3. Cardiac enzymes were negative. D-dimer was elevated at 3.25. Patient was treated with antibiotics and underwent left thoracentesis. She was followed by pulmonology. Pleural effusion was transudative. She developed AF with RVR in the hospital and was seen by cardiology. Repeat echo demonstrated normal LV function and possible trivial pericardial effusion. She was loaded with amiodarone for an attempt at rhythm control. She returned to NSR.  Effusion resolved on subsequent echo.   Echo in 9/18: normal LV function with mild AS.  No effusion.    Overall, she feels well.  Denies : Chest pain. Dizziness. Leg edema. Nitroglycerin use. Orthopnea. Palpitations. Paroxysmal  nocturnal dyspnea. Syncope.   She has some DOE and dyspnea with dialysis.  No regular walking program.     Past Medical History:  Diagnosis Date  . Cardiomyopathy ([place])    a. h/o LV dysfunction EF 20-25% in 2013 due to sepsis.>> improved to normal   . Chronic diastolic CHF (congestive heart failure) ([place])    10/ 2013 in setting of septic shock  . Complication of anesthesia    use a little anesthesia , per patient MD states she quit breathing (2016); hard to wake up  . ESRD (end stage renal disease) ([place])    dialysis Tues, Thurs, Sat [address], sees dr deterding   . GERD (gastroesophageal reflux disease)   . Glaucoma    both eyes  . H/O hiatal hernia    a. CT 2017: large gastric hiatal hernia.  Marland Kitchen Headache(784.0)    migraine hx of  . History of blood transfusion 04/13/2015   . History of echocardiogram    a. Echo 6/17: EF 60-65%, normal wall motion, mild MR, atrial septal lipomatous hypertrophy, PASP 34 mmHg, possible trivial free-flowing pericardial effusion along RV free wall // b. Echo 5/17: Mild LVH, EF 55-60%, normal wall motion, grade 1 diastolic dysfunction, trivial MR, severe LAE, mild RAE, PASP 42 mmHg  . History of nephrostomy ([place]) 04/11/2015   currently inplace 04/28/2015  removed now  . History of nuclear stress test    a. Myoview 1/14 - Marked ischemia in the basal anterior, mid anterior, apical septal and apical inferior regions, EF 63% >> LHC normal   . Hyperlipidemia   . Hypertension    medication removed from regimen due  to low blood pressure   . Iron deficiency    hx  . Nephrolithiasis 2002, 2006   bilateral  . Normal coronary arteries 2014   a. LHC in 1/14: normal coronary arteries  . PAF (paroxysmal atrial fibrillation) ([place])    a. 10/ 2013  in setting of Septic Shock //  b. recurrent during admit for pneumonia, L effusion >> placed on Amiodarone // Coumadin for anticoagulation  . Pneumonia jan 2018, last tme lungs clear now   dx 10-06-2014 per CXR--   on 10-27-2014 pt states finished antibiotic and denies cough or fever  . S/P hemodialysis catheter insertion ([place]) 04/11/2015    right anterior chest , only used once   . Sigmoid diverticulosis   . UTI (urinary tract infection) 05/10/2016    Past Surgical History:  Procedure Laterality Date  . AV FISTULA PLACEMENT Left 06/02/2015   Procedure: BRACHIOCEPHALIC ARTERIOVENOUS (AV) FISTULA CREATION ;  Surgeon: Conrad Dresden, MD;  Location: [place];  Service: Vascular;  Laterality: Left;  . BASCILIC VEIN TRANSPOSITION Left 07/27/2015   Procedure: FIRST STAGE BASILIC VEIN TRANSPOSITION LEFT UPPER ARM;  Surgeon: Conrad Montgomery, MD;  Location: [place];  Service: Vascular;  Laterality: Left;  . BASCILIC VEIN TRANSPOSITION Left 09/2015   second phase  . BASCILIC VEIN TRANSPOSITION Left 10/12/2015   Procedure: SECOND STAGE BASILIC VEIN TRANSPOSITION LEFT ARM;  Surgeon: Conrad [place] Hill, MD;  Location: [place];  Service: Vascular;  Laterality: Left;  . BREAST BIOPSY Left 08/23/07   benign fibrocystic with duct ectasia  . CARDIAC CATHETERIZATION  07-11-2012  dr Irish Lack   Abnormal stress test/   normal coronary arteries/  LVEDP  55mmHg  . CARDIOVASCULAR STRESS TEST  06-26-2012  dr Irish Lack   marked ischemia in the basal anterior, mid anterior, apical inferior regions/  normal LVF, ef 63%  . CATARACT EXTRACTION W/ INTRAOCULAR LENS  IMPLANT, BILATERAL    . COLONOSCOPY WITH PROPOFOL N/A 10/17/2016   Procedure: COLONOSCOPY WITH PROPOFOL;  Surgeon: Garlan Fair, MD;  Location: [facility];  Service: Endoscopy;  Laterality: N/A;  . CYSTOSCOPY W/ URETERAL STENT PLACEMENT  04/04/2012   Procedure: CYSTOSCOPY WITH RETROGRADE PYELOGRAM/URETERAL STENT PLACEMENT;  Surgeon: Ailene Rud, MD;  Location: [place];  Service: Urology;  Laterality: Left;  . CYSTOSCOPY W/ URETERAL STENT PLACEMENT Bilateral 05/04/2015   Procedure: CYSTOSCOPY WITH BILATERAL RETROGRADE PYELOGRAM/ WITH INTERPRETATION, EXCHANGE OF RIGHT URETERAL STENT  REPLACEMENT AND PLACEMENT LEFT URETERAL STENT PLACEMENT EXAMINATION OF VAGINA;  Surgeon: Carolan Clines, MD;  Location: [facility];  Service: Urology;  Laterality: Bilateral;  . CYSTOSCOPY WITH STENT PLACEMENT Right 10/28/2014   Procedure: RIGHT URETERAL STENT PLACEMENT;  Surgeon: Irine Seal, MD;  Location: [facility];  Service: Urology;  Laterality: Right;  . CYSTOSCOPY WITH STENT PLACEMENT Right 02/26/2015   Procedure: CYSTOSCOPY RETROGRADE PYELOGRAM WITH STENT PLACEMENT;  Surgeon: Cleon Gustin, MD;  Location: [facility];  Service: Urology;  Laterality: Right;  . CYSTOSCOPY/RETROGRADE/URETEROSCOPY/STONE EXTRACTION WITH BASKET Right 11/21/2014   Procedure: CYSTOSCOPY/RIGHT RETROGRADE PYELOGRAM/RIGHT URETEROSCOPY/BASKET EXTRACTION/RIGHT PYELOSCOPY/LASER OF STONE/RIGHT DOUBLE J STENT;  Surgeon: Carolan Clines, MD;  Location: [place];  Service: Urology;  Laterality: Right;  . EXTRACORPOREAL SHOCK WAVE LITHOTRIPSY  05-28-2012  &  10-08-2012  . HOLMIUM LASER APPLICATION Right 07/27/7410   Procedure: HOLMIUM LASER APPLICATION;  Surgeon: Carolan Clines, MD;  Location: [facility];  Service: Urology;  Laterality: Right;  . IR GENERIC HISTORICAL  02/01/2016   IR NEPHROSTOMY EXCHANGE  RIGHT 02/01/2016 Greggory Keen, MD [facility]  . IR GENERIC HISTORICAL  02/24/2016   IR PATIENT EVAL TECH 0-60 MINS 02/24/2016 Aletta Edouard, MD [facility]  . KNEE ARTHROSCOPY Left 02-14-2003  . LAPAROSCOPIC CHOLECYSTECTOMY  03-23-2005  . TOTAL ABDOMINAL HYSTERECTOMY W/ BILATERAL SALPINGOOPHORECTOMY  1993   secondary to fibroids  . TRANSTHORACIC ECHOCARDIOGRAM  04-09-2012   normal LVF,  ef 60-65%,  mild LAE,  mild TR, trivial MR and PR     Current Outpatient Prescriptions  Medication Sig Dispense Refill  . acetaminophen (TYLENOL) 500 MG tablet Take 1,000 mg by mouth every 6 (six) hours as needed for moderate pain or headache.     Marland Kitchen amiodarone (PACERONE) 200 MG tablet  TAKE 1 TABLET BY MOUTH DAILY 30 tablet 2  . bimatoprost (LUMIGAN) 0.01 % SOLN Place 1 drop into both eyes at bedtime.    . calcium acetate (PHOSLO) 667 MG capsule Take 1,334 mg by mouth 3 (three) times daily with meals.     . cephALEXin (KEFLEX) 250 MG capsule Take 250 mg by mouth daily.    . Multiple Vitamins-Minerals (MULTIVITAMIN WITH MINERALS) tablet Take 1 tablet by mouth daily.    Marland Kitchen nystatin cream (MYCOSTATIN) Apply to affected area BID as needed 30 g 2  . ranitidine (ZANTAC) 150 MG tablet Take 150 mg by mouth 2 (two) times daily as needed for heartburn.    . saccharomyces boulardii (FLORASTOR) 250 MG capsule Take 1 capsule (250 mg total) by mouth 2 (two) times daily. 60 capsule 0  . sodium chloride (OCEAN) 0.65 % SOLN nasal spray Place 1 spray into both nostrils 3 (three) times daily as needed for congestion.    . timolol (BETIMOL) 0.5 % ophthalmic solution Place 1 drop into both eyes every morning.     . triamcinolone (KENALOG) 0.025 % ointment Apply 1 application topically 2 (two) times daily. 30 g 2  . warfarin (COUMADIN) 5 MG tablet TAKE AS DIRECTED BY COUMADIN CLINIC. 30 TABLETS = 30 DAYS 30 tablet 3   No current facility-administered medications for this visit.     Allergies:   Vicodin [hydrocodone-acetaminophen]; Chlorhexidine; and Percocet [oxycodone-acetaminophen]    Social History:  The patient  reports that she has never smoked. She has never used smokeless tobacco. She reports that she does not drink alcohol or use drugs.   Family History:  The patient's family history includes Bladder Cancer in her maternal grandfather; Cancer (age of onset: 28) in her father; Cancer (age of onset: 21) in her mother; Dementia in her mother; Diabetes in her brother; Heart disease in her brother; Heart failure in her paternal grandmother; Hypertension in her brother and mother.    ROS:  Please see the history of present illness.   Otherwise, review of systems are positive for occasional low  BP with dialysis.   All other systems are reviewed and negative.    PHYSICAL EXAM: VS:  BP 122/72   Pulse 77   Ht 5\' 2"  (1.575 m)   Wt 194 lb 9.6 oz (88.3 kg)   LMP 01/19/1992 (Approximate)   SpO2 99%   BMI 35.59 kg/m  , BMI Body mass index is 35.59 kg/m. GEN: Well nourished, well developed, in no acute distress  HEENT: normal  Neck: no JVD, carotid bruits, or masses Cardiac: RRR; 2/6 systolic murmur,; no rubs, or gallops,no edema  Respiratory:  clear to auscultation bilaterally, normal work of breathing GI: soft, nontender, nondistended, + BS MS: no deformity or atrophy  Skin:  warm and dry, no rash Neuro:  Strength and sensation are intact Psych: euthymic mood, full affect   EKG:   The ekg ordered today demonstrates NSR, septal Q waves, no ST changes   Recent Labs: 07/01/2016: ALT 21 07/03/2016: BUN 22; Creatinine, Ser 4.36; Hemoglobin 9.6; Platelets 172; Potassium 3.7; Sodium 138   Lipid Panel    Component Value Date/Time   CHOL 146 11/20/2015 1302   TRIG 84 07/04/2015 1014   HDL 43 07/04/2015 1014   CHOLHDL 2.9 07/04/2015 1014   VLDL 17 07/04/2015 1014   LDLCALC 63 07/04/2015 1014     Other studies Reviewed: Additional studies/ records that were reviewed today with results demonstrating: labs reviewed, no TSH or LFTs within the last 6 months.  Lipids well controlled in 8/18. 2018 echo reviewed as above.    ASSESSMENT AND PLAN:  1. PAF: Maintaining normal sinus rhythm on amiodarone. Continue routine checks of her LFTs and thyroid function tests.  2. Hypertension:BP has been well controlled.  Sometimes, low BP at dialysis.  She can have leg cramps during dialysis as well.   Continue current meds for now.  3. Mild AS:  Causing her heart murmur. No sx of severe AS.  Continue to follow.  4. Prior cardiomyopathy has resolved   Current medicines are reviewed at length with the patient today.  The patient concerns regarding her medicines were addressed.  The  following changes have been made:  No change  Labs/ tests ordered today include:  No orders of the defined types were placed in this encounter.   Recommend 150 minutes/week of aerobic exercise Low fat, low carb, high fiber diet recommended  Disposition:   FU in 1 year   Signed, Larae Grooms, MD  03/07/2017 3:48 PM    [facility] [place], [place], [place]  [address] Phone: [phone]; Fax: [phone]

## 2017-03-07 NOTE — Patient Instructions (Signed)
Medication Instructions:  Your physician recommends that you continue on your current medications as directed. Please refer to the Current Medication list given to you today.   Labwork: Please have a TSH and LFT drawn when you go to dialysis  Testing/Procedures: None ordered  Follow-Up: Your physician wants you to follow-up in: 1 year with Dr. Irish Lack. You will receive a reminder letter in the mail two months in advance. If you don't receive a letter, please call our office to schedule the follow-up appointment.   Any Other Special Instructions Will Be Listed Below (If Applicable).     If you need a refill on your cardiac medications before your next appointment, please call your pharmacy.

## 2017-03-09 DIAGNOSIS — I482 Chronic atrial fibrillation: Secondary | ICD-10-CM | POA: Diagnosis not present

## 2017-03-09 DIAGNOSIS — D509 Iron deficiency anemia, unspecified: Secondary | ICD-10-CM | POA: Diagnosis not present

## 2017-03-09 DIAGNOSIS — D688 Other specified coagulation defects: Secondary | ICD-10-CM | POA: Diagnosis not present

## 2017-03-09 DIAGNOSIS — E039 Hypothyroidism, unspecified: Secondary | ICD-10-CM | POA: Insufficient documentation

## 2017-03-09 DIAGNOSIS — Z5181 Encounter for therapeutic drug level monitoring: Secondary | ICD-10-CM | POA: Diagnosis not present

## 2017-03-09 DIAGNOSIS — N186 End stage renal disease: Secondary | ICD-10-CM | POA: Diagnosis not present

## 2017-03-09 DIAGNOSIS — N2581 Secondary hyperparathyroidism of renal origin: Secondary | ICD-10-CM | POA: Diagnosis not present

## 2017-03-09 DIAGNOSIS — R51 Headache: Secondary | ICD-10-CM | POA: Diagnosis not present

## 2017-03-09 DIAGNOSIS — D631 Anemia in chronic kidney disease: Secondary | ICD-10-CM | POA: Diagnosis not present

## 2017-03-11 DIAGNOSIS — D688 Other specified coagulation defects: Secondary | ICD-10-CM | POA: Diagnosis not present

## 2017-03-11 DIAGNOSIS — E039 Hypothyroidism, unspecified: Secondary | ICD-10-CM | POA: Diagnosis not present

## 2017-03-11 DIAGNOSIS — D509 Iron deficiency anemia, unspecified: Secondary | ICD-10-CM | POA: Diagnosis not present

## 2017-03-11 DIAGNOSIS — R51 Headache: Secondary | ICD-10-CM | POA: Diagnosis not present

## 2017-03-11 DIAGNOSIS — Z5181 Encounter for therapeutic drug level monitoring: Secondary | ICD-10-CM | POA: Diagnosis not present

## 2017-03-11 DIAGNOSIS — N2581 Secondary hyperparathyroidism of renal origin: Secondary | ICD-10-CM | POA: Diagnosis not present

## 2017-03-11 DIAGNOSIS — I482 Chronic atrial fibrillation: Secondary | ICD-10-CM | POA: Diagnosis not present

## 2017-03-11 DIAGNOSIS — D631 Anemia in chronic kidney disease: Secondary | ICD-10-CM | POA: Diagnosis not present

## 2017-03-11 DIAGNOSIS — N186 End stage renal disease: Secondary | ICD-10-CM | POA: Diagnosis not present

## 2017-03-14 DIAGNOSIS — R51 Headache: Secondary | ICD-10-CM | POA: Diagnosis not present

## 2017-03-14 DIAGNOSIS — N2581 Secondary hyperparathyroidism of renal origin: Secondary | ICD-10-CM | POA: Diagnosis not present

## 2017-03-14 DIAGNOSIS — D509 Iron deficiency anemia, unspecified: Secondary | ICD-10-CM | POA: Diagnosis not present

## 2017-03-14 DIAGNOSIS — D688 Other specified coagulation defects: Secondary | ICD-10-CM | POA: Diagnosis not present

## 2017-03-14 DIAGNOSIS — N186 End stage renal disease: Secondary | ICD-10-CM | POA: Diagnosis not present

## 2017-03-14 DIAGNOSIS — I482 Chronic atrial fibrillation: Secondary | ICD-10-CM | POA: Diagnosis not present

## 2017-03-14 DIAGNOSIS — E039 Hypothyroidism, unspecified: Secondary | ICD-10-CM | POA: Diagnosis not present

## 2017-03-14 DIAGNOSIS — D631 Anemia in chronic kidney disease: Secondary | ICD-10-CM | POA: Diagnosis not present

## 2017-03-14 DIAGNOSIS — Z5181 Encounter for therapeutic drug level monitoring: Secondary | ICD-10-CM | POA: Diagnosis not present

## 2017-03-16 DIAGNOSIS — D509 Iron deficiency anemia, unspecified: Secondary | ICD-10-CM | POA: Diagnosis not present

## 2017-03-16 DIAGNOSIS — R51 Headache: Secondary | ICD-10-CM | POA: Diagnosis not present

## 2017-03-16 DIAGNOSIS — Z5181 Encounter for therapeutic drug level monitoring: Secondary | ICD-10-CM | POA: Diagnosis not present

## 2017-03-16 DIAGNOSIS — D631 Anemia in chronic kidney disease: Secondary | ICD-10-CM | POA: Diagnosis not present

## 2017-03-16 DIAGNOSIS — N2581 Secondary hyperparathyroidism of renal origin: Secondary | ICD-10-CM | POA: Diagnosis not present

## 2017-03-16 DIAGNOSIS — E039 Hypothyroidism, unspecified: Secondary | ICD-10-CM | POA: Diagnosis not present

## 2017-03-16 DIAGNOSIS — N186 End stage renal disease: Secondary | ICD-10-CM | POA: Diagnosis not present

## 2017-03-16 DIAGNOSIS — D688 Other specified coagulation defects: Secondary | ICD-10-CM | POA: Diagnosis not present

## 2017-03-16 DIAGNOSIS — I482 Chronic atrial fibrillation: Secondary | ICD-10-CM | POA: Diagnosis not present

## 2017-03-18 DIAGNOSIS — R51 Headache: Secondary | ICD-10-CM | POA: Diagnosis not present

## 2017-03-18 DIAGNOSIS — I482 Chronic atrial fibrillation: Secondary | ICD-10-CM | POA: Diagnosis not present

## 2017-03-18 DIAGNOSIS — D631 Anemia in chronic kidney disease: Secondary | ICD-10-CM | POA: Diagnosis not present

## 2017-03-18 DIAGNOSIS — Z5181 Encounter for therapeutic drug level monitoring: Secondary | ICD-10-CM | POA: Diagnosis not present

## 2017-03-18 DIAGNOSIS — D509 Iron deficiency anemia, unspecified: Secondary | ICD-10-CM | POA: Diagnosis not present

## 2017-03-18 DIAGNOSIS — N2581 Secondary hyperparathyroidism of renal origin: Secondary | ICD-10-CM | POA: Diagnosis not present

## 2017-03-18 DIAGNOSIS — N186 End stage renal disease: Secondary | ICD-10-CM | POA: Diagnosis not present

## 2017-03-18 DIAGNOSIS — E039 Hypothyroidism, unspecified: Secondary | ICD-10-CM | POA: Diagnosis not present

## 2017-03-18 DIAGNOSIS — D688 Other specified coagulation defects: Secondary | ICD-10-CM | POA: Diagnosis not present

## 2017-03-19 DIAGNOSIS — Z992 Dependence on renal dialysis: Secondary | ICD-10-CM | POA: Diagnosis not present

## 2017-03-19 DIAGNOSIS — I129 Hypertensive chronic kidney disease with stage 1 through stage 4 chronic kidney disease, or unspecified chronic kidney disease: Secondary | ICD-10-CM | POA: Diagnosis not present

## 2017-03-19 DIAGNOSIS — N186 End stage renal disease: Secondary | ICD-10-CM | POA: Diagnosis not present

## 2017-03-21 DIAGNOSIS — E039 Hypothyroidism, unspecified: Secondary | ICD-10-CM | POA: Diagnosis not present

## 2017-03-21 DIAGNOSIS — R51 Headache: Secondary | ICD-10-CM | POA: Diagnosis not present

## 2017-03-21 DIAGNOSIS — D688 Other specified coagulation defects: Secondary | ICD-10-CM | POA: Diagnosis not present

## 2017-03-21 DIAGNOSIS — N2581 Secondary hyperparathyroidism of renal origin: Secondary | ICD-10-CM | POA: Diagnosis not present

## 2017-03-21 DIAGNOSIS — N186 End stage renal disease: Secondary | ICD-10-CM | POA: Diagnosis not present

## 2017-03-21 DIAGNOSIS — Z5181 Encounter for therapeutic drug level monitoring: Secondary | ICD-10-CM | POA: Diagnosis not present

## 2017-03-21 DIAGNOSIS — D509 Iron deficiency anemia, unspecified: Secondary | ICD-10-CM | POA: Diagnosis not present

## 2017-03-21 DIAGNOSIS — I482 Chronic atrial fibrillation: Secondary | ICD-10-CM | POA: Diagnosis not present

## 2017-03-21 DIAGNOSIS — D631 Anemia in chronic kidney disease: Secondary | ICD-10-CM | POA: Diagnosis not present

## 2017-03-22 DIAGNOSIS — Z23 Encounter for immunization: Secondary | ICD-10-CM | POA: Diagnosis not present

## 2017-03-23 DIAGNOSIS — D509 Iron deficiency anemia, unspecified: Secondary | ICD-10-CM | POA: Diagnosis not present

## 2017-03-23 DIAGNOSIS — E039 Hypothyroidism, unspecified: Secondary | ICD-10-CM | POA: Diagnosis not present

## 2017-03-23 DIAGNOSIS — R51 Headache: Secondary | ICD-10-CM | POA: Diagnosis not present

## 2017-03-23 DIAGNOSIS — I482 Chronic atrial fibrillation: Secondary | ICD-10-CM | POA: Diagnosis not present

## 2017-03-23 DIAGNOSIS — N186 End stage renal disease: Secondary | ICD-10-CM | POA: Diagnosis not present

## 2017-03-23 DIAGNOSIS — D631 Anemia in chronic kidney disease: Secondary | ICD-10-CM | POA: Diagnosis not present

## 2017-03-23 DIAGNOSIS — N2581 Secondary hyperparathyroidism of renal origin: Secondary | ICD-10-CM | POA: Diagnosis not present

## 2017-03-23 DIAGNOSIS — Z5181 Encounter for therapeutic drug level monitoring: Secondary | ICD-10-CM | POA: Diagnosis not present

## 2017-03-23 DIAGNOSIS — D688 Other specified coagulation defects: Secondary | ICD-10-CM | POA: Diagnosis not present

## 2017-03-25 DIAGNOSIS — D509 Iron deficiency anemia, unspecified: Secondary | ICD-10-CM | POA: Diagnosis not present

## 2017-03-25 DIAGNOSIS — R51 Headache: Secondary | ICD-10-CM | POA: Diagnosis not present

## 2017-03-25 DIAGNOSIS — E039 Hypothyroidism, unspecified: Secondary | ICD-10-CM | POA: Diagnosis not present

## 2017-03-25 DIAGNOSIS — D688 Other specified coagulation defects: Secondary | ICD-10-CM | POA: Diagnosis not present

## 2017-03-25 DIAGNOSIS — D631 Anemia in chronic kidney disease: Secondary | ICD-10-CM | POA: Diagnosis not present

## 2017-03-25 DIAGNOSIS — N186 End stage renal disease: Secondary | ICD-10-CM | POA: Diagnosis not present

## 2017-03-25 DIAGNOSIS — N2581 Secondary hyperparathyroidism of renal origin: Secondary | ICD-10-CM | POA: Diagnosis not present

## 2017-03-25 DIAGNOSIS — I482 Chronic atrial fibrillation: Secondary | ICD-10-CM | POA: Diagnosis not present

## 2017-03-25 DIAGNOSIS — Z5181 Encounter for therapeutic drug level monitoring: Secondary | ICD-10-CM | POA: Diagnosis not present

## 2017-03-28 DIAGNOSIS — N2581 Secondary hyperparathyroidism of renal origin: Secondary | ICD-10-CM | POA: Diagnosis not present

## 2017-03-28 DIAGNOSIS — R51 Headache: Secondary | ICD-10-CM | POA: Diagnosis not present

## 2017-03-28 DIAGNOSIS — Z5181 Encounter for therapeutic drug level monitoring: Secondary | ICD-10-CM | POA: Diagnosis not present

## 2017-03-28 DIAGNOSIS — D631 Anemia in chronic kidney disease: Secondary | ICD-10-CM | POA: Diagnosis not present

## 2017-03-28 DIAGNOSIS — D509 Iron deficiency anemia, unspecified: Secondary | ICD-10-CM | POA: Diagnosis not present

## 2017-03-28 DIAGNOSIS — N186 End stage renal disease: Secondary | ICD-10-CM | POA: Diagnosis not present

## 2017-03-28 DIAGNOSIS — D688 Other specified coagulation defects: Secondary | ICD-10-CM | POA: Diagnosis not present

## 2017-03-28 DIAGNOSIS — E039 Hypothyroidism, unspecified: Secondary | ICD-10-CM | POA: Diagnosis not present

## 2017-03-28 DIAGNOSIS — I482 Chronic atrial fibrillation: Secondary | ICD-10-CM | POA: Diagnosis not present

## 2017-03-30 DIAGNOSIS — D509 Iron deficiency anemia, unspecified: Secondary | ICD-10-CM | POA: Diagnosis not present

## 2017-03-30 DIAGNOSIS — D631 Anemia in chronic kidney disease: Secondary | ICD-10-CM | POA: Diagnosis not present

## 2017-03-30 DIAGNOSIS — I482 Chronic atrial fibrillation: Secondary | ICD-10-CM | POA: Diagnosis not present

## 2017-03-30 DIAGNOSIS — E039 Hypothyroidism, unspecified: Secondary | ICD-10-CM | POA: Diagnosis not present

## 2017-03-30 DIAGNOSIS — N186 End stage renal disease: Secondary | ICD-10-CM | POA: Diagnosis not present

## 2017-03-30 DIAGNOSIS — N2581 Secondary hyperparathyroidism of renal origin: Secondary | ICD-10-CM | POA: Diagnosis not present

## 2017-03-30 DIAGNOSIS — R51 Headache: Secondary | ICD-10-CM | POA: Diagnosis not present

## 2017-03-30 DIAGNOSIS — Z5181 Encounter for therapeutic drug level monitoring: Secondary | ICD-10-CM | POA: Diagnosis not present

## 2017-03-30 DIAGNOSIS — D688 Other specified coagulation defects: Secondary | ICD-10-CM | POA: Diagnosis not present

## 2017-04-01 DIAGNOSIS — R51 Headache: Secondary | ICD-10-CM | POA: Diagnosis not present

## 2017-04-01 DIAGNOSIS — N186 End stage renal disease: Secondary | ICD-10-CM | POA: Diagnosis not present

## 2017-04-01 DIAGNOSIS — D688 Other specified coagulation defects: Secondary | ICD-10-CM | POA: Diagnosis not present

## 2017-04-01 DIAGNOSIS — N2581 Secondary hyperparathyroidism of renal origin: Secondary | ICD-10-CM | POA: Diagnosis not present

## 2017-04-01 DIAGNOSIS — Z5181 Encounter for therapeutic drug level monitoring: Secondary | ICD-10-CM | POA: Diagnosis not present

## 2017-04-01 DIAGNOSIS — I482 Chronic atrial fibrillation: Secondary | ICD-10-CM | POA: Diagnosis not present

## 2017-04-01 DIAGNOSIS — E039 Hypothyroidism, unspecified: Secondary | ICD-10-CM | POA: Diagnosis not present

## 2017-04-01 DIAGNOSIS — D631 Anemia in chronic kidney disease: Secondary | ICD-10-CM | POA: Diagnosis not present

## 2017-04-01 DIAGNOSIS — D509 Iron deficiency anemia, unspecified: Secondary | ICD-10-CM | POA: Diagnosis not present

## 2017-04-04 DIAGNOSIS — N186 End stage renal disease: Secondary | ICD-10-CM | POA: Diagnosis not present

## 2017-04-04 DIAGNOSIS — R51 Headache: Secondary | ICD-10-CM | POA: Diagnosis not present

## 2017-04-04 DIAGNOSIS — Z5181 Encounter for therapeutic drug level monitoring: Secondary | ICD-10-CM | POA: Diagnosis not present

## 2017-04-04 DIAGNOSIS — D631 Anemia in chronic kidney disease: Secondary | ICD-10-CM | POA: Diagnosis not present

## 2017-04-04 DIAGNOSIS — D509 Iron deficiency anemia, unspecified: Secondary | ICD-10-CM | POA: Diagnosis not present

## 2017-04-04 DIAGNOSIS — I482 Chronic atrial fibrillation: Secondary | ICD-10-CM | POA: Diagnosis not present

## 2017-04-04 DIAGNOSIS — E039 Hypothyroidism, unspecified: Secondary | ICD-10-CM | POA: Diagnosis not present

## 2017-04-04 DIAGNOSIS — D688 Other specified coagulation defects: Secondary | ICD-10-CM | POA: Diagnosis not present

## 2017-04-04 DIAGNOSIS — N2581 Secondary hyperparathyroidism of renal origin: Secondary | ICD-10-CM | POA: Diagnosis not present

## 2017-04-06 DIAGNOSIS — N2581 Secondary hyperparathyroidism of renal origin: Secondary | ICD-10-CM | POA: Diagnosis not present

## 2017-04-06 DIAGNOSIS — D509 Iron deficiency anemia, unspecified: Secondary | ICD-10-CM | POA: Diagnosis not present

## 2017-04-06 DIAGNOSIS — N186 End stage renal disease: Secondary | ICD-10-CM | POA: Diagnosis not present

## 2017-04-06 DIAGNOSIS — I482 Chronic atrial fibrillation: Secondary | ICD-10-CM | POA: Diagnosis not present

## 2017-04-06 DIAGNOSIS — R51 Headache: Secondary | ICD-10-CM | POA: Diagnosis not present

## 2017-04-06 DIAGNOSIS — E039 Hypothyroidism, unspecified: Secondary | ICD-10-CM | POA: Diagnosis not present

## 2017-04-06 DIAGNOSIS — Z5181 Encounter for therapeutic drug level monitoring: Secondary | ICD-10-CM | POA: Diagnosis not present

## 2017-04-06 DIAGNOSIS — D688 Other specified coagulation defects: Secondary | ICD-10-CM | POA: Diagnosis not present

## 2017-04-06 DIAGNOSIS — D631 Anemia in chronic kidney disease: Secondary | ICD-10-CM | POA: Diagnosis not present

## 2017-04-08 DIAGNOSIS — D509 Iron deficiency anemia, unspecified: Secondary | ICD-10-CM | POA: Diagnosis not present

## 2017-04-08 DIAGNOSIS — N2581 Secondary hyperparathyroidism of renal origin: Secondary | ICD-10-CM | POA: Diagnosis not present

## 2017-04-08 DIAGNOSIS — N186 End stage renal disease: Secondary | ICD-10-CM | POA: Diagnosis not present

## 2017-04-08 DIAGNOSIS — E039 Hypothyroidism, unspecified: Secondary | ICD-10-CM | POA: Diagnosis not present

## 2017-04-08 DIAGNOSIS — D688 Other specified coagulation defects: Secondary | ICD-10-CM | POA: Diagnosis not present

## 2017-04-08 DIAGNOSIS — D631 Anemia in chronic kidney disease: Secondary | ICD-10-CM | POA: Diagnosis not present

## 2017-04-08 DIAGNOSIS — R51 Headache: Secondary | ICD-10-CM | POA: Diagnosis not present

## 2017-04-08 DIAGNOSIS — I482 Chronic atrial fibrillation: Secondary | ICD-10-CM | POA: Diagnosis not present

## 2017-04-08 DIAGNOSIS — Z5181 Encounter for therapeutic drug level monitoring: Secondary | ICD-10-CM | POA: Diagnosis not present

## 2017-04-11 DIAGNOSIS — I482 Chronic atrial fibrillation: Secondary | ICD-10-CM | POA: Diagnosis not present

## 2017-04-11 DIAGNOSIS — D509 Iron deficiency anemia, unspecified: Secondary | ICD-10-CM | POA: Diagnosis not present

## 2017-04-11 DIAGNOSIS — D631 Anemia in chronic kidney disease: Secondary | ICD-10-CM | POA: Diagnosis not present

## 2017-04-11 DIAGNOSIS — N2581 Secondary hyperparathyroidism of renal origin: Secondary | ICD-10-CM | POA: Diagnosis not present

## 2017-04-11 DIAGNOSIS — R51 Headache: Secondary | ICD-10-CM | POA: Diagnosis not present

## 2017-04-11 DIAGNOSIS — Z5181 Encounter for therapeutic drug level monitoring: Secondary | ICD-10-CM | POA: Diagnosis not present

## 2017-04-11 DIAGNOSIS — E039 Hypothyroidism, unspecified: Secondary | ICD-10-CM | POA: Diagnosis not present

## 2017-04-11 DIAGNOSIS — N186 End stage renal disease: Secondary | ICD-10-CM | POA: Diagnosis not present

## 2017-04-11 DIAGNOSIS — D688 Other specified coagulation defects: Secondary | ICD-10-CM | POA: Diagnosis not present

## 2017-04-13 DIAGNOSIS — Z5181 Encounter for therapeutic drug level monitoring: Secondary | ICD-10-CM | POA: Diagnosis not present

## 2017-04-13 DIAGNOSIS — N2581 Secondary hyperparathyroidism of renal origin: Secondary | ICD-10-CM | POA: Diagnosis not present

## 2017-04-13 DIAGNOSIS — N186 End stage renal disease: Secondary | ICD-10-CM | POA: Diagnosis not present

## 2017-04-13 DIAGNOSIS — D631 Anemia in chronic kidney disease: Secondary | ICD-10-CM | POA: Diagnosis not present

## 2017-04-13 DIAGNOSIS — D688 Other specified coagulation defects: Secondary | ICD-10-CM | POA: Diagnosis not present

## 2017-04-13 DIAGNOSIS — R51 Headache: Secondary | ICD-10-CM | POA: Diagnosis not present

## 2017-04-13 DIAGNOSIS — I482 Chronic atrial fibrillation: Secondary | ICD-10-CM | POA: Diagnosis not present

## 2017-04-13 DIAGNOSIS — D509 Iron deficiency anemia, unspecified: Secondary | ICD-10-CM | POA: Diagnosis not present

## 2017-04-13 DIAGNOSIS — E039 Hypothyroidism, unspecified: Secondary | ICD-10-CM | POA: Diagnosis not present

## 2017-04-15 DIAGNOSIS — D631 Anemia in chronic kidney disease: Secondary | ICD-10-CM | POA: Diagnosis not present

## 2017-04-15 DIAGNOSIS — Z5181 Encounter for therapeutic drug level monitoring: Secondary | ICD-10-CM | POA: Diagnosis not present

## 2017-04-15 DIAGNOSIS — D688 Other specified coagulation defects: Secondary | ICD-10-CM | POA: Diagnosis not present

## 2017-04-15 DIAGNOSIS — N2581 Secondary hyperparathyroidism of renal origin: Secondary | ICD-10-CM | POA: Diagnosis not present

## 2017-04-15 DIAGNOSIS — N186 End stage renal disease: Secondary | ICD-10-CM | POA: Diagnosis not present

## 2017-04-15 DIAGNOSIS — D509 Iron deficiency anemia, unspecified: Secondary | ICD-10-CM | POA: Diagnosis not present

## 2017-04-15 DIAGNOSIS — I482 Chronic atrial fibrillation: Secondary | ICD-10-CM | POA: Diagnosis not present

## 2017-04-15 DIAGNOSIS — R51 Headache: Secondary | ICD-10-CM | POA: Diagnosis not present

## 2017-04-15 DIAGNOSIS — E039 Hypothyroidism, unspecified: Secondary | ICD-10-CM | POA: Diagnosis not present

## 2017-04-18 DIAGNOSIS — N186 End stage renal disease: Secondary | ICD-10-CM | POA: Diagnosis not present

## 2017-04-18 DIAGNOSIS — D631 Anemia in chronic kidney disease: Secondary | ICD-10-CM | POA: Diagnosis not present

## 2017-04-18 DIAGNOSIS — D688 Other specified coagulation defects: Secondary | ICD-10-CM | POA: Diagnosis not present

## 2017-04-18 DIAGNOSIS — Z5181 Encounter for therapeutic drug level monitoring: Secondary | ICD-10-CM | POA: Diagnosis not present

## 2017-04-18 DIAGNOSIS — N2581 Secondary hyperparathyroidism of renal origin: Secondary | ICD-10-CM | POA: Diagnosis not present

## 2017-04-18 DIAGNOSIS — I482 Chronic atrial fibrillation: Secondary | ICD-10-CM | POA: Diagnosis not present

## 2017-04-18 DIAGNOSIS — E039 Hypothyroidism, unspecified: Secondary | ICD-10-CM | POA: Diagnosis not present

## 2017-04-18 DIAGNOSIS — D509 Iron deficiency anemia, unspecified: Secondary | ICD-10-CM | POA: Diagnosis not present

## 2017-04-18 DIAGNOSIS — R51 Headache: Secondary | ICD-10-CM | POA: Diagnosis not present

## 2017-04-19 ENCOUNTER — Ambulatory Visit (INDEPENDENT_AMBULATORY_CARE_PROVIDER_SITE_OTHER): Payer: Medicare Other | Admitting: *Deleted

## 2017-04-19 DIAGNOSIS — I4891 Unspecified atrial fibrillation: Secondary | ICD-10-CM | POA: Diagnosis not present

## 2017-04-19 DIAGNOSIS — N186 End stage renal disease: Secondary | ICD-10-CM | POA: Diagnosis not present

## 2017-04-19 DIAGNOSIS — Z992 Dependence on renal dialysis: Secondary | ICD-10-CM | POA: Diagnosis not present

## 2017-04-19 DIAGNOSIS — Z5181 Encounter for therapeutic drug level monitoring: Secondary | ICD-10-CM | POA: Diagnosis not present

## 2017-04-19 DIAGNOSIS — I480 Paroxysmal atrial fibrillation: Secondary | ICD-10-CM

## 2017-04-19 DIAGNOSIS — I129 Hypertensive chronic kidney disease with stage 1 through stage 4 chronic kidney disease, or unspecified chronic kidney disease: Secondary | ICD-10-CM | POA: Diagnosis not present

## 2017-04-19 LAB — POCT INR: INR: 3.3

## 2017-04-20 DIAGNOSIS — D688 Other specified coagulation defects: Secondary | ICD-10-CM | POA: Diagnosis not present

## 2017-04-20 DIAGNOSIS — N186 End stage renal disease: Secondary | ICD-10-CM | POA: Diagnosis not present

## 2017-04-20 DIAGNOSIS — Z5181 Encounter for therapeutic drug level monitoring: Secondary | ICD-10-CM | POA: Diagnosis not present

## 2017-04-20 DIAGNOSIS — I482 Chronic atrial fibrillation: Secondary | ICD-10-CM | POA: Diagnosis not present

## 2017-04-20 DIAGNOSIS — D631 Anemia in chronic kidney disease: Secondary | ICD-10-CM | POA: Diagnosis not present

## 2017-04-20 DIAGNOSIS — R51 Headache: Secondary | ICD-10-CM | POA: Diagnosis not present

## 2017-04-20 DIAGNOSIS — D509 Iron deficiency anemia, unspecified: Secondary | ICD-10-CM | POA: Diagnosis not present

## 2017-04-20 DIAGNOSIS — E039 Hypothyroidism, unspecified: Secondary | ICD-10-CM | POA: Diagnosis not present

## 2017-04-20 DIAGNOSIS — N2581 Secondary hyperparathyroidism of renal origin: Secondary | ICD-10-CM | POA: Diagnosis not present

## 2017-04-22 DIAGNOSIS — D631 Anemia in chronic kidney disease: Secondary | ICD-10-CM | POA: Diagnosis not present

## 2017-04-22 DIAGNOSIS — N2581 Secondary hyperparathyroidism of renal origin: Secondary | ICD-10-CM | POA: Diagnosis not present

## 2017-04-22 DIAGNOSIS — N186 End stage renal disease: Secondary | ICD-10-CM | POA: Diagnosis not present

## 2017-04-22 DIAGNOSIS — D688 Other specified coagulation defects: Secondary | ICD-10-CM | POA: Diagnosis not present

## 2017-04-22 DIAGNOSIS — Z5181 Encounter for therapeutic drug level monitoring: Secondary | ICD-10-CM | POA: Diagnosis not present

## 2017-04-22 DIAGNOSIS — E039 Hypothyroidism, unspecified: Secondary | ICD-10-CM | POA: Diagnosis not present

## 2017-04-22 DIAGNOSIS — R51 Headache: Secondary | ICD-10-CM | POA: Diagnosis not present

## 2017-04-22 DIAGNOSIS — D509 Iron deficiency anemia, unspecified: Secondary | ICD-10-CM | POA: Diagnosis not present

## 2017-04-22 DIAGNOSIS — I482 Chronic atrial fibrillation: Secondary | ICD-10-CM | POA: Diagnosis not present

## 2017-04-25 DIAGNOSIS — D509 Iron deficiency anemia, unspecified: Secondary | ICD-10-CM | POA: Diagnosis not present

## 2017-04-25 DIAGNOSIS — R51 Headache: Secondary | ICD-10-CM | POA: Diagnosis not present

## 2017-04-25 DIAGNOSIS — N186 End stage renal disease: Secondary | ICD-10-CM | POA: Diagnosis not present

## 2017-04-25 DIAGNOSIS — Z5181 Encounter for therapeutic drug level monitoring: Secondary | ICD-10-CM | POA: Diagnosis not present

## 2017-04-25 DIAGNOSIS — D688 Other specified coagulation defects: Secondary | ICD-10-CM | POA: Diagnosis not present

## 2017-04-25 DIAGNOSIS — E039 Hypothyroidism, unspecified: Secondary | ICD-10-CM | POA: Diagnosis not present

## 2017-04-25 DIAGNOSIS — I482 Chronic atrial fibrillation: Secondary | ICD-10-CM | POA: Diagnosis not present

## 2017-04-25 DIAGNOSIS — D631 Anemia in chronic kidney disease: Secondary | ICD-10-CM | POA: Diagnosis not present

## 2017-04-25 DIAGNOSIS — N2581 Secondary hyperparathyroidism of renal origin: Secondary | ICD-10-CM | POA: Diagnosis not present

## 2017-04-27 DIAGNOSIS — D631 Anemia in chronic kidney disease: Secondary | ICD-10-CM | POA: Diagnosis not present

## 2017-04-27 DIAGNOSIS — Z5181 Encounter for therapeutic drug level monitoring: Secondary | ICD-10-CM | POA: Diagnosis not present

## 2017-04-27 DIAGNOSIS — R51 Headache: Secondary | ICD-10-CM | POA: Diagnosis not present

## 2017-04-27 DIAGNOSIS — D688 Other specified coagulation defects: Secondary | ICD-10-CM | POA: Diagnosis not present

## 2017-04-27 DIAGNOSIS — N186 End stage renal disease: Secondary | ICD-10-CM | POA: Diagnosis not present

## 2017-04-27 DIAGNOSIS — I482 Chronic atrial fibrillation: Secondary | ICD-10-CM | POA: Diagnosis not present

## 2017-04-27 DIAGNOSIS — E039 Hypothyroidism, unspecified: Secondary | ICD-10-CM | POA: Diagnosis not present

## 2017-04-27 DIAGNOSIS — D509 Iron deficiency anemia, unspecified: Secondary | ICD-10-CM | POA: Diagnosis not present

## 2017-04-27 DIAGNOSIS — N2581 Secondary hyperparathyroidism of renal origin: Secondary | ICD-10-CM | POA: Diagnosis not present

## 2017-04-29 DIAGNOSIS — D631 Anemia in chronic kidney disease: Secondary | ICD-10-CM | POA: Diagnosis not present

## 2017-04-29 DIAGNOSIS — N186 End stage renal disease: Secondary | ICD-10-CM | POA: Diagnosis not present

## 2017-04-29 DIAGNOSIS — N2581 Secondary hyperparathyroidism of renal origin: Secondary | ICD-10-CM | POA: Diagnosis not present

## 2017-04-29 DIAGNOSIS — R51 Headache: Secondary | ICD-10-CM | POA: Diagnosis not present

## 2017-04-29 DIAGNOSIS — I482 Chronic atrial fibrillation: Secondary | ICD-10-CM | POA: Diagnosis not present

## 2017-04-29 DIAGNOSIS — E039 Hypothyroidism, unspecified: Secondary | ICD-10-CM | POA: Diagnosis not present

## 2017-04-29 DIAGNOSIS — D509 Iron deficiency anemia, unspecified: Secondary | ICD-10-CM | POA: Diagnosis not present

## 2017-04-29 DIAGNOSIS — Z5181 Encounter for therapeutic drug level monitoring: Secondary | ICD-10-CM | POA: Diagnosis not present

## 2017-04-29 DIAGNOSIS — D688 Other specified coagulation defects: Secondary | ICD-10-CM | POA: Diagnosis not present

## 2017-05-02 DIAGNOSIS — N186 End stage renal disease: Secondary | ICD-10-CM | POA: Diagnosis not present

## 2017-05-02 DIAGNOSIS — D509 Iron deficiency anemia, unspecified: Secondary | ICD-10-CM | POA: Diagnosis not present

## 2017-05-02 DIAGNOSIS — Z5181 Encounter for therapeutic drug level monitoring: Secondary | ICD-10-CM | POA: Diagnosis not present

## 2017-05-02 DIAGNOSIS — R51 Headache: Secondary | ICD-10-CM | POA: Diagnosis not present

## 2017-05-02 DIAGNOSIS — D688 Other specified coagulation defects: Secondary | ICD-10-CM | POA: Diagnosis not present

## 2017-05-02 DIAGNOSIS — N2581 Secondary hyperparathyroidism of renal origin: Secondary | ICD-10-CM | POA: Diagnosis not present

## 2017-05-02 DIAGNOSIS — I482 Chronic atrial fibrillation: Secondary | ICD-10-CM | POA: Diagnosis not present

## 2017-05-02 DIAGNOSIS — E039 Hypothyroidism, unspecified: Secondary | ICD-10-CM | POA: Diagnosis not present

## 2017-05-02 DIAGNOSIS — D631 Anemia in chronic kidney disease: Secondary | ICD-10-CM | POA: Diagnosis not present

## 2017-05-04 DIAGNOSIS — N2581 Secondary hyperparathyroidism of renal origin: Secondary | ICD-10-CM | POA: Diagnosis not present

## 2017-05-04 DIAGNOSIS — Z5181 Encounter for therapeutic drug level monitoring: Secondary | ICD-10-CM | POA: Diagnosis not present

## 2017-05-04 DIAGNOSIS — N186 End stage renal disease: Secondary | ICD-10-CM | POA: Diagnosis not present

## 2017-05-04 DIAGNOSIS — R51 Headache: Secondary | ICD-10-CM | POA: Diagnosis not present

## 2017-05-04 DIAGNOSIS — D631 Anemia in chronic kidney disease: Secondary | ICD-10-CM | POA: Diagnosis not present

## 2017-05-04 DIAGNOSIS — I482 Chronic atrial fibrillation: Secondary | ICD-10-CM | POA: Diagnosis not present

## 2017-05-04 DIAGNOSIS — E039 Hypothyroidism, unspecified: Secondary | ICD-10-CM | POA: Diagnosis not present

## 2017-05-04 DIAGNOSIS — D509 Iron deficiency anemia, unspecified: Secondary | ICD-10-CM | POA: Diagnosis not present

## 2017-05-04 DIAGNOSIS — D688 Other specified coagulation defects: Secondary | ICD-10-CM | POA: Diagnosis not present

## 2017-05-06 DIAGNOSIS — E039 Hypothyroidism, unspecified: Secondary | ICD-10-CM | POA: Diagnosis not present

## 2017-05-06 DIAGNOSIS — D509 Iron deficiency anemia, unspecified: Secondary | ICD-10-CM | POA: Diagnosis not present

## 2017-05-06 DIAGNOSIS — D631 Anemia in chronic kidney disease: Secondary | ICD-10-CM | POA: Diagnosis not present

## 2017-05-06 DIAGNOSIS — N186 End stage renal disease: Secondary | ICD-10-CM | POA: Diagnosis not present

## 2017-05-06 DIAGNOSIS — R51 Headache: Secondary | ICD-10-CM | POA: Diagnosis not present

## 2017-05-06 DIAGNOSIS — D688 Other specified coagulation defects: Secondary | ICD-10-CM | POA: Diagnosis not present

## 2017-05-06 DIAGNOSIS — I482 Chronic atrial fibrillation: Secondary | ICD-10-CM | POA: Diagnosis not present

## 2017-05-06 DIAGNOSIS — N2581 Secondary hyperparathyroidism of renal origin: Secondary | ICD-10-CM | POA: Diagnosis not present

## 2017-05-06 DIAGNOSIS — Z5181 Encounter for therapeutic drug level monitoring: Secondary | ICD-10-CM | POA: Diagnosis not present

## 2017-05-08 DIAGNOSIS — D688 Other specified coagulation defects: Secondary | ICD-10-CM | POA: Diagnosis not present

## 2017-05-08 DIAGNOSIS — E039 Hypothyroidism, unspecified: Secondary | ICD-10-CM | POA: Diagnosis not present

## 2017-05-08 DIAGNOSIS — N2581 Secondary hyperparathyroidism of renal origin: Secondary | ICD-10-CM | POA: Diagnosis not present

## 2017-05-08 DIAGNOSIS — Z5181 Encounter for therapeutic drug level monitoring: Secondary | ICD-10-CM | POA: Diagnosis not present

## 2017-05-08 DIAGNOSIS — N186 End stage renal disease: Secondary | ICD-10-CM | POA: Diagnosis not present

## 2017-05-08 DIAGNOSIS — D509 Iron deficiency anemia, unspecified: Secondary | ICD-10-CM | POA: Diagnosis not present

## 2017-05-08 DIAGNOSIS — R51 Headache: Secondary | ICD-10-CM | POA: Diagnosis not present

## 2017-05-08 DIAGNOSIS — D631 Anemia in chronic kidney disease: Secondary | ICD-10-CM | POA: Diagnosis not present

## 2017-05-08 DIAGNOSIS — I482 Chronic atrial fibrillation: Secondary | ICD-10-CM | POA: Diagnosis not present

## 2017-05-10 DIAGNOSIS — R51 Headache: Secondary | ICD-10-CM | POA: Diagnosis not present

## 2017-05-10 DIAGNOSIS — D688 Other specified coagulation defects: Secondary | ICD-10-CM | POA: Diagnosis not present

## 2017-05-10 DIAGNOSIS — N186 End stage renal disease: Secondary | ICD-10-CM | POA: Diagnosis not present

## 2017-05-10 DIAGNOSIS — Z5181 Encounter for therapeutic drug level monitoring: Secondary | ICD-10-CM | POA: Diagnosis not present

## 2017-05-10 DIAGNOSIS — I482 Chronic atrial fibrillation: Secondary | ICD-10-CM | POA: Diagnosis not present

## 2017-05-10 DIAGNOSIS — N2581 Secondary hyperparathyroidism of renal origin: Secondary | ICD-10-CM | POA: Diagnosis not present

## 2017-05-10 DIAGNOSIS — E039 Hypothyroidism, unspecified: Secondary | ICD-10-CM | POA: Diagnosis not present

## 2017-05-10 DIAGNOSIS — D631 Anemia in chronic kidney disease: Secondary | ICD-10-CM | POA: Diagnosis not present

## 2017-05-10 DIAGNOSIS — D509 Iron deficiency anemia, unspecified: Secondary | ICD-10-CM | POA: Diagnosis not present

## 2017-05-13 DIAGNOSIS — D509 Iron deficiency anemia, unspecified: Secondary | ICD-10-CM | POA: Diagnosis not present

## 2017-05-13 DIAGNOSIS — N2581 Secondary hyperparathyroidism of renal origin: Secondary | ICD-10-CM | POA: Diagnosis not present

## 2017-05-13 DIAGNOSIS — R51 Headache: Secondary | ICD-10-CM | POA: Diagnosis not present

## 2017-05-13 DIAGNOSIS — D631 Anemia in chronic kidney disease: Secondary | ICD-10-CM | POA: Diagnosis not present

## 2017-05-13 DIAGNOSIS — Z5181 Encounter for therapeutic drug level monitoring: Secondary | ICD-10-CM | POA: Diagnosis not present

## 2017-05-13 DIAGNOSIS — E039 Hypothyroidism, unspecified: Secondary | ICD-10-CM | POA: Diagnosis not present

## 2017-05-13 DIAGNOSIS — I482 Chronic atrial fibrillation: Secondary | ICD-10-CM | POA: Diagnosis not present

## 2017-05-13 DIAGNOSIS — D688 Other specified coagulation defects: Secondary | ICD-10-CM | POA: Diagnosis not present

## 2017-05-13 DIAGNOSIS — N186 End stage renal disease: Secondary | ICD-10-CM | POA: Diagnosis not present

## 2017-05-16 DIAGNOSIS — R51 Headache: Secondary | ICD-10-CM | POA: Diagnosis not present

## 2017-05-16 DIAGNOSIS — N186 End stage renal disease: Secondary | ICD-10-CM | POA: Diagnosis not present

## 2017-05-16 DIAGNOSIS — E039 Hypothyroidism, unspecified: Secondary | ICD-10-CM | POA: Diagnosis not present

## 2017-05-16 DIAGNOSIS — I482 Chronic atrial fibrillation: Secondary | ICD-10-CM | POA: Diagnosis not present

## 2017-05-16 DIAGNOSIS — N2581 Secondary hyperparathyroidism of renal origin: Secondary | ICD-10-CM | POA: Diagnosis not present

## 2017-05-16 DIAGNOSIS — D688 Other specified coagulation defects: Secondary | ICD-10-CM | POA: Diagnosis not present

## 2017-05-16 DIAGNOSIS — D631 Anemia in chronic kidney disease: Secondary | ICD-10-CM | POA: Diagnosis not present

## 2017-05-16 DIAGNOSIS — D509 Iron deficiency anemia, unspecified: Secondary | ICD-10-CM | POA: Diagnosis not present

## 2017-05-16 DIAGNOSIS — Z5181 Encounter for therapeutic drug level monitoring: Secondary | ICD-10-CM | POA: Diagnosis not present

## 2017-05-17 ENCOUNTER — Ambulatory Visit (INDEPENDENT_AMBULATORY_CARE_PROVIDER_SITE_OTHER): Payer: Medicare Other | Admitting: *Deleted

## 2017-05-17 DIAGNOSIS — Z5181 Encounter for therapeutic drug level monitoring: Secondary | ICD-10-CM | POA: Diagnosis not present

## 2017-05-17 DIAGNOSIS — I4891 Unspecified atrial fibrillation: Secondary | ICD-10-CM | POA: Diagnosis not present

## 2017-05-17 DIAGNOSIS — I480 Paroxysmal atrial fibrillation: Secondary | ICD-10-CM | POA: Diagnosis not present

## 2017-05-17 LAB — POCT INR: INR: 3.2

## 2017-05-17 NOTE — Patient Instructions (Signed)
Today take 1/2 tablet then start taking 1/2 tablet daily except 1 tablet on Mondays, Wednesdays, and Fridays.  Call [facility] [phone], with any new medications or if scheduled for any procedures.  Recheck in 2 weeks.

## 2017-05-18 DIAGNOSIS — N2581 Secondary hyperparathyroidism of renal origin: Secondary | ICD-10-CM | POA: Diagnosis not present

## 2017-05-18 DIAGNOSIS — Z5181 Encounter for therapeutic drug level monitoring: Secondary | ICD-10-CM | POA: Diagnosis not present

## 2017-05-18 DIAGNOSIS — D631 Anemia in chronic kidney disease: Secondary | ICD-10-CM | POA: Diagnosis not present

## 2017-05-18 DIAGNOSIS — D509 Iron deficiency anemia, unspecified: Secondary | ICD-10-CM | POA: Diagnosis not present

## 2017-05-18 DIAGNOSIS — N186 End stage renal disease: Secondary | ICD-10-CM | POA: Diagnosis not present

## 2017-05-18 DIAGNOSIS — D688 Other specified coagulation defects: Secondary | ICD-10-CM | POA: Diagnosis not present

## 2017-05-18 DIAGNOSIS — E039 Hypothyroidism, unspecified: Secondary | ICD-10-CM | POA: Diagnosis not present

## 2017-05-18 DIAGNOSIS — I482 Chronic atrial fibrillation: Secondary | ICD-10-CM | POA: Diagnosis not present

## 2017-05-18 DIAGNOSIS — R51 Headache: Secondary | ICD-10-CM | POA: Diagnosis not present

## 2017-05-19 DIAGNOSIS — Z992 Dependence on renal dialysis: Secondary | ICD-10-CM | POA: Diagnosis not present

## 2017-05-19 DIAGNOSIS — N186 End stage renal disease: Secondary | ICD-10-CM | POA: Diagnosis not present

## 2017-05-19 DIAGNOSIS — I129 Hypertensive chronic kidney disease with stage 1 through stage 4 chronic kidney disease, or unspecified chronic kidney disease: Secondary | ICD-10-CM | POA: Diagnosis not present

## 2017-05-20 DIAGNOSIS — D688 Other specified coagulation defects: Secondary | ICD-10-CM | POA: Diagnosis not present

## 2017-05-20 DIAGNOSIS — N2581 Secondary hyperparathyroidism of renal origin: Secondary | ICD-10-CM | POA: Diagnosis not present

## 2017-05-20 DIAGNOSIS — N186 End stage renal disease: Secondary | ICD-10-CM | POA: Diagnosis not present

## 2017-05-20 DIAGNOSIS — D509 Iron deficiency anemia, unspecified: Secondary | ICD-10-CM | POA: Diagnosis not present

## 2017-05-20 DIAGNOSIS — R51 Headache: Secondary | ICD-10-CM | POA: Diagnosis not present

## 2017-05-20 DIAGNOSIS — D631 Anemia in chronic kidney disease: Secondary | ICD-10-CM | POA: Diagnosis not present

## 2017-05-23 DIAGNOSIS — N2581 Secondary hyperparathyroidism of renal origin: Secondary | ICD-10-CM | POA: Diagnosis not present

## 2017-05-23 DIAGNOSIS — D509 Iron deficiency anemia, unspecified: Secondary | ICD-10-CM | POA: Diagnosis not present

## 2017-05-23 DIAGNOSIS — N186 End stage renal disease: Secondary | ICD-10-CM | POA: Diagnosis not present

## 2017-05-23 DIAGNOSIS — D688 Other specified coagulation defects: Secondary | ICD-10-CM | POA: Diagnosis not present

## 2017-05-23 DIAGNOSIS — D631 Anemia in chronic kidney disease: Secondary | ICD-10-CM | POA: Diagnosis not present

## 2017-05-23 DIAGNOSIS — R51 Headache: Secondary | ICD-10-CM | POA: Diagnosis not present

## 2017-05-25 DIAGNOSIS — N186 End stage renal disease: Secondary | ICD-10-CM | POA: Diagnosis not present

## 2017-05-25 DIAGNOSIS — D688 Other specified coagulation defects: Secondary | ICD-10-CM | POA: Diagnosis not present

## 2017-05-25 DIAGNOSIS — N2581 Secondary hyperparathyroidism of renal origin: Secondary | ICD-10-CM | POA: Diagnosis not present

## 2017-05-25 DIAGNOSIS — D509 Iron deficiency anemia, unspecified: Secondary | ICD-10-CM | POA: Diagnosis not present

## 2017-05-25 DIAGNOSIS — D631 Anemia in chronic kidney disease: Secondary | ICD-10-CM | POA: Diagnosis not present

## 2017-05-25 DIAGNOSIS — R51 Headache: Secondary | ICD-10-CM | POA: Diagnosis not present

## 2017-05-27 DIAGNOSIS — R51 Headache: Secondary | ICD-10-CM | POA: Diagnosis not present

## 2017-05-27 DIAGNOSIS — D509 Iron deficiency anemia, unspecified: Secondary | ICD-10-CM | POA: Diagnosis not present

## 2017-05-27 DIAGNOSIS — N2581 Secondary hyperparathyroidism of renal origin: Secondary | ICD-10-CM | POA: Diagnosis not present

## 2017-05-27 DIAGNOSIS — N186 End stage renal disease: Secondary | ICD-10-CM | POA: Diagnosis not present

## 2017-05-27 DIAGNOSIS — D631 Anemia in chronic kidney disease: Secondary | ICD-10-CM | POA: Diagnosis not present

## 2017-05-27 DIAGNOSIS — D688 Other specified coagulation defects: Secondary | ICD-10-CM | POA: Diagnosis not present

## 2017-05-30 DIAGNOSIS — D688 Other specified coagulation defects: Secondary | ICD-10-CM | POA: Diagnosis not present

## 2017-05-30 DIAGNOSIS — D509 Iron deficiency anemia, unspecified: Secondary | ICD-10-CM | POA: Diagnosis not present

## 2017-05-30 DIAGNOSIS — N2581 Secondary hyperparathyroidism of renal origin: Secondary | ICD-10-CM | POA: Diagnosis not present

## 2017-05-30 DIAGNOSIS — N186 End stage renal disease: Secondary | ICD-10-CM | POA: Diagnosis not present

## 2017-05-30 DIAGNOSIS — D631 Anemia in chronic kidney disease: Secondary | ICD-10-CM | POA: Diagnosis not present

## 2017-05-30 DIAGNOSIS — R51 Headache: Secondary | ICD-10-CM | POA: Diagnosis not present

## 2017-06-01 DIAGNOSIS — D509 Iron deficiency anemia, unspecified: Secondary | ICD-10-CM | POA: Diagnosis not present

## 2017-06-01 DIAGNOSIS — D688 Other specified coagulation defects: Secondary | ICD-10-CM | POA: Diagnosis not present

## 2017-06-01 DIAGNOSIS — N2581 Secondary hyperparathyroidism of renal origin: Secondary | ICD-10-CM | POA: Diagnosis not present

## 2017-06-01 DIAGNOSIS — D631 Anemia in chronic kidney disease: Secondary | ICD-10-CM | POA: Diagnosis not present

## 2017-06-01 DIAGNOSIS — N186 End stage renal disease: Secondary | ICD-10-CM | POA: Diagnosis not present

## 2017-06-01 DIAGNOSIS — R51 Headache: Secondary | ICD-10-CM | POA: Diagnosis not present

## 2017-06-02 ENCOUNTER — Ambulatory Visit (INDEPENDENT_AMBULATORY_CARE_PROVIDER_SITE_OTHER): Payer: Medicare Other | Admitting: *Deleted

## 2017-06-02 DIAGNOSIS — I480 Paroxysmal atrial fibrillation: Secondary | ICD-10-CM

## 2017-06-02 DIAGNOSIS — I4891 Unspecified atrial fibrillation: Secondary | ICD-10-CM

## 2017-06-02 DIAGNOSIS — Z5181 Encounter for therapeutic drug level monitoring: Secondary | ICD-10-CM

## 2017-06-02 LAB — POCT INR: INR: 2.6

## 2017-06-02 NOTE — Patient Instructions (Signed)
Description   Continue taking 1/2 tablet daily except 1 tablet on Mondays, Wednesdays, and Fridays.  Call [facility] [phone], with any new medications or if scheduled for any procedures.  Recheck in 3 weeks.

## 2017-06-03 DIAGNOSIS — N2581 Secondary hyperparathyroidism of renal origin: Secondary | ICD-10-CM | POA: Diagnosis not present

## 2017-06-03 DIAGNOSIS — D631 Anemia in chronic kidney disease: Secondary | ICD-10-CM | POA: Diagnosis not present

## 2017-06-03 DIAGNOSIS — R51 Headache: Secondary | ICD-10-CM | POA: Diagnosis not present

## 2017-06-03 DIAGNOSIS — D688 Other specified coagulation defects: Secondary | ICD-10-CM | POA: Diagnosis not present

## 2017-06-03 DIAGNOSIS — N186 End stage renal disease: Secondary | ICD-10-CM | POA: Diagnosis not present

## 2017-06-03 DIAGNOSIS — D509 Iron deficiency anemia, unspecified: Secondary | ICD-10-CM | POA: Diagnosis not present

## 2017-06-06 DIAGNOSIS — N2581 Secondary hyperparathyroidism of renal origin: Secondary | ICD-10-CM | POA: Diagnosis not present

## 2017-06-06 DIAGNOSIS — D688 Other specified coagulation defects: Secondary | ICD-10-CM | POA: Diagnosis not present

## 2017-06-06 DIAGNOSIS — D631 Anemia in chronic kidney disease: Secondary | ICD-10-CM | POA: Diagnosis not present

## 2017-06-06 DIAGNOSIS — N186 End stage renal disease: Secondary | ICD-10-CM | POA: Diagnosis not present

## 2017-06-06 DIAGNOSIS — D509 Iron deficiency anemia, unspecified: Secondary | ICD-10-CM | POA: Diagnosis not present

## 2017-06-06 DIAGNOSIS — R51 Headache: Secondary | ICD-10-CM | POA: Diagnosis not present

## 2017-06-08 DIAGNOSIS — R51 Headache: Secondary | ICD-10-CM | POA: Diagnosis not present

## 2017-06-08 DIAGNOSIS — D509 Iron deficiency anemia, unspecified: Secondary | ICD-10-CM | POA: Diagnosis not present

## 2017-06-08 DIAGNOSIS — N186 End stage renal disease: Secondary | ICD-10-CM | POA: Diagnosis not present

## 2017-06-08 DIAGNOSIS — N2581 Secondary hyperparathyroidism of renal origin: Secondary | ICD-10-CM | POA: Diagnosis not present

## 2017-06-08 DIAGNOSIS — D688 Other specified coagulation defects: Secondary | ICD-10-CM | POA: Diagnosis not present

## 2017-06-08 DIAGNOSIS — D631 Anemia in chronic kidney disease: Secondary | ICD-10-CM | POA: Diagnosis not present

## 2017-06-10 DIAGNOSIS — D631 Anemia in chronic kidney disease: Secondary | ICD-10-CM | POA: Diagnosis not present

## 2017-06-10 DIAGNOSIS — D509 Iron deficiency anemia, unspecified: Secondary | ICD-10-CM | POA: Diagnosis not present

## 2017-06-10 DIAGNOSIS — R51 Headache: Secondary | ICD-10-CM | POA: Diagnosis not present

## 2017-06-10 DIAGNOSIS — N186 End stage renal disease: Secondary | ICD-10-CM | POA: Diagnosis not present

## 2017-06-10 DIAGNOSIS — N2581 Secondary hyperparathyroidism of renal origin: Secondary | ICD-10-CM | POA: Diagnosis not present

## 2017-06-10 DIAGNOSIS — D688 Other specified coagulation defects: Secondary | ICD-10-CM | POA: Diagnosis not present

## 2017-06-12 DIAGNOSIS — D688 Other specified coagulation defects: Secondary | ICD-10-CM | POA: Diagnosis not present

## 2017-06-12 DIAGNOSIS — D509 Iron deficiency anemia, unspecified: Secondary | ICD-10-CM | POA: Diagnosis not present

## 2017-06-12 DIAGNOSIS — R51 Headache: Secondary | ICD-10-CM | POA: Diagnosis not present

## 2017-06-12 DIAGNOSIS — N186 End stage renal disease: Secondary | ICD-10-CM | POA: Diagnosis not present

## 2017-06-12 DIAGNOSIS — N2581 Secondary hyperparathyroidism of renal origin: Secondary | ICD-10-CM | POA: Diagnosis not present

## 2017-06-12 DIAGNOSIS — D631 Anemia in chronic kidney disease: Secondary | ICD-10-CM | POA: Diagnosis not present

## 2017-06-15 DIAGNOSIS — D509 Iron deficiency anemia, unspecified: Secondary | ICD-10-CM | POA: Diagnosis not present

## 2017-06-15 DIAGNOSIS — R51 Headache: Secondary | ICD-10-CM | POA: Diagnosis not present

## 2017-06-15 DIAGNOSIS — N186 End stage renal disease: Secondary | ICD-10-CM | POA: Diagnosis not present

## 2017-06-15 DIAGNOSIS — D631 Anemia in chronic kidney disease: Secondary | ICD-10-CM | POA: Diagnosis not present

## 2017-06-15 DIAGNOSIS — D688 Other specified coagulation defects: Secondary | ICD-10-CM | POA: Diagnosis not present

## 2017-06-15 DIAGNOSIS — N2581 Secondary hyperparathyroidism of renal origin: Secondary | ICD-10-CM | POA: Diagnosis not present

## 2017-06-17 DIAGNOSIS — N2581 Secondary hyperparathyroidism of renal origin: Secondary | ICD-10-CM | POA: Diagnosis not present

## 2017-06-17 DIAGNOSIS — D631 Anemia in chronic kidney disease: Secondary | ICD-10-CM | POA: Diagnosis not present

## 2017-06-17 DIAGNOSIS — D509 Iron deficiency anemia, unspecified: Secondary | ICD-10-CM | POA: Diagnosis not present

## 2017-06-17 DIAGNOSIS — R51 Headache: Secondary | ICD-10-CM | POA: Diagnosis not present

## 2017-06-17 DIAGNOSIS — N186 End stage renal disease: Secondary | ICD-10-CM | POA: Diagnosis not present

## 2017-06-17 DIAGNOSIS — D688 Other specified coagulation defects: Secondary | ICD-10-CM | POA: Diagnosis not present

## 2017-06-19 DIAGNOSIS — N186 End stage renal disease: Secondary | ICD-10-CM | POA: Diagnosis not present

## 2017-06-19 DIAGNOSIS — R51 Headache: Secondary | ICD-10-CM | POA: Diagnosis not present

## 2017-06-19 DIAGNOSIS — D631 Anemia in chronic kidney disease: Secondary | ICD-10-CM | POA: Diagnosis not present

## 2017-06-19 DIAGNOSIS — D509 Iron deficiency anemia, unspecified: Secondary | ICD-10-CM | POA: Diagnosis not present

## 2017-06-19 DIAGNOSIS — Z992 Dependence on renal dialysis: Secondary | ICD-10-CM | POA: Diagnosis not present

## 2017-06-19 DIAGNOSIS — I129 Hypertensive chronic kidney disease with stage 1 through stage 4 chronic kidney disease, or unspecified chronic kidney disease: Secondary | ICD-10-CM | POA: Diagnosis not present

## 2017-06-19 DIAGNOSIS — D688 Other specified coagulation defects: Secondary | ICD-10-CM | POA: Diagnosis not present

## 2017-06-19 DIAGNOSIS — N2581 Secondary hyperparathyroidism of renal origin: Secondary | ICD-10-CM | POA: Diagnosis not present

## 2017-06-22 DIAGNOSIS — D688 Other specified coagulation defects: Secondary | ICD-10-CM | POA: Diagnosis not present

## 2017-06-22 DIAGNOSIS — R51 Headache: Secondary | ICD-10-CM | POA: Diagnosis not present

## 2017-06-22 DIAGNOSIS — I482 Chronic atrial fibrillation: Secondary | ICD-10-CM | POA: Diagnosis not present

## 2017-06-22 DIAGNOSIS — N186 End stage renal disease: Secondary | ICD-10-CM | POA: Diagnosis not present

## 2017-06-22 DIAGNOSIS — E039 Hypothyroidism, unspecified: Secondary | ICD-10-CM | POA: Diagnosis not present

## 2017-06-22 DIAGNOSIS — D509 Iron deficiency anemia, unspecified: Secondary | ICD-10-CM | POA: Diagnosis not present

## 2017-06-22 DIAGNOSIS — N2581 Secondary hyperparathyroidism of renal origin: Secondary | ICD-10-CM | POA: Diagnosis not present

## 2017-06-22 DIAGNOSIS — Z5181 Encounter for therapeutic drug level monitoring: Secondary | ICD-10-CM | POA: Diagnosis not present

## 2017-06-23 ENCOUNTER — Ambulatory Visit (INDEPENDENT_AMBULATORY_CARE_PROVIDER_SITE_OTHER): Payer: Medicare Other | Admitting: *Deleted

## 2017-06-23 DIAGNOSIS — I4891 Unspecified atrial fibrillation: Secondary | ICD-10-CM | POA: Diagnosis not present

## 2017-06-23 DIAGNOSIS — I480 Paroxysmal atrial fibrillation: Secondary | ICD-10-CM | POA: Diagnosis not present

## 2017-06-23 DIAGNOSIS — Z5181 Encounter for therapeutic drug level monitoring: Secondary | ICD-10-CM

## 2017-06-23 LAB — POCT INR: INR: 2.1

## 2017-06-23 NOTE — Patient Instructions (Signed)
Description   Continue taking 1/2 tablet daily except 1 tablet on Mondays, Wednesdays, and Fridays.  Call [facility] [phone], with any new medications or if scheduled for any procedures.  Recheck in 4 weeks.

## 2017-06-24 DIAGNOSIS — N186 End stage renal disease: Secondary | ICD-10-CM | POA: Diagnosis not present

## 2017-06-24 DIAGNOSIS — Z5181 Encounter for therapeutic drug level monitoring: Secondary | ICD-10-CM | POA: Diagnosis not present

## 2017-06-24 DIAGNOSIS — D509 Iron deficiency anemia, unspecified: Secondary | ICD-10-CM | POA: Diagnosis not present

## 2017-06-24 DIAGNOSIS — R51 Headache: Secondary | ICD-10-CM | POA: Diagnosis not present

## 2017-06-24 DIAGNOSIS — D688 Other specified coagulation defects: Secondary | ICD-10-CM | POA: Diagnosis not present

## 2017-06-24 DIAGNOSIS — E039 Hypothyroidism, unspecified: Secondary | ICD-10-CM | POA: Diagnosis not present

## 2017-06-24 DIAGNOSIS — N2581 Secondary hyperparathyroidism of renal origin: Secondary | ICD-10-CM | POA: Diagnosis not present

## 2017-06-24 DIAGNOSIS — I482 Chronic atrial fibrillation: Secondary | ICD-10-CM | POA: Diagnosis not present

## 2017-06-27 DIAGNOSIS — N186 End stage renal disease: Secondary | ICD-10-CM | POA: Diagnosis not present

## 2017-06-27 DIAGNOSIS — Z5181 Encounter for therapeutic drug level monitoring: Secondary | ICD-10-CM | POA: Diagnosis not present

## 2017-06-27 DIAGNOSIS — D688 Other specified coagulation defects: Secondary | ICD-10-CM | POA: Diagnosis not present

## 2017-06-27 DIAGNOSIS — I482 Chronic atrial fibrillation: Secondary | ICD-10-CM | POA: Diagnosis not present

## 2017-06-27 DIAGNOSIS — R51 Headache: Secondary | ICD-10-CM | POA: Diagnosis not present

## 2017-06-27 DIAGNOSIS — D509 Iron deficiency anemia, unspecified: Secondary | ICD-10-CM | POA: Diagnosis not present

## 2017-06-27 DIAGNOSIS — N2581 Secondary hyperparathyroidism of renal origin: Secondary | ICD-10-CM | POA: Diagnosis not present

## 2017-06-27 DIAGNOSIS — E039 Hypothyroidism, unspecified: Secondary | ICD-10-CM | POA: Diagnosis not present

## 2017-06-29 DIAGNOSIS — R51 Headache: Secondary | ICD-10-CM | POA: Diagnosis not present

## 2017-06-29 DIAGNOSIS — D688 Other specified coagulation defects: Secondary | ICD-10-CM | POA: Diagnosis not present

## 2017-06-29 DIAGNOSIS — N186 End stage renal disease: Secondary | ICD-10-CM | POA: Diagnosis not present

## 2017-06-29 DIAGNOSIS — Z5181 Encounter for therapeutic drug level monitoring: Secondary | ICD-10-CM | POA: Diagnosis not present

## 2017-06-29 DIAGNOSIS — E039 Hypothyroidism, unspecified: Secondary | ICD-10-CM | POA: Diagnosis not present

## 2017-06-29 DIAGNOSIS — I482 Chronic atrial fibrillation: Secondary | ICD-10-CM | POA: Diagnosis not present

## 2017-06-29 DIAGNOSIS — D509 Iron deficiency anemia, unspecified: Secondary | ICD-10-CM | POA: Diagnosis not present

## 2017-06-29 DIAGNOSIS — N2581 Secondary hyperparathyroidism of renal origin: Secondary | ICD-10-CM | POA: Diagnosis not present

## 2017-06-30 DIAGNOSIS — Z5181 Encounter for therapeutic drug level monitoring: Secondary | ICD-10-CM | POA: Diagnosis not present

## 2017-06-30 DIAGNOSIS — R51 Headache: Secondary | ICD-10-CM | POA: Diagnosis not present

## 2017-06-30 DIAGNOSIS — E039 Hypothyroidism, unspecified: Secondary | ICD-10-CM | POA: Diagnosis not present

## 2017-06-30 DIAGNOSIS — N186 End stage renal disease: Secondary | ICD-10-CM | POA: Diagnosis not present

## 2017-06-30 DIAGNOSIS — D509 Iron deficiency anemia, unspecified: Secondary | ICD-10-CM | POA: Diagnosis not present

## 2017-06-30 DIAGNOSIS — I482 Chronic atrial fibrillation: Secondary | ICD-10-CM | POA: Diagnosis not present

## 2017-06-30 DIAGNOSIS — D688 Other specified coagulation defects: Secondary | ICD-10-CM | POA: Diagnosis not present

## 2017-06-30 DIAGNOSIS — N2581 Secondary hyperparathyroidism of renal origin: Secondary | ICD-10-CM | POA: Diagnosis not present

## 2017-07-04 DIAGNOSIS — N2581 Secondary hyperparathyroidism of renal origin: Secondary | ICD-10-CM | POA: Diagnosis not present

## 2017-07-04 DIAGNOSIS — R51 Headache: Secondary | ICD-10-CM | POA: Diagnosis not present

## 2017-07-04 DIAGNOSIS — I482 Chronic atrial fibrillation: Secondary | ICD-10-CM | POA: Diagnosis not present

## 2017-07-04 DIAGNOSIS — E039 Hypothyroidism, unspecified: Secondary | ICD-10-CM | POA: Diagnosis not present

## 2017-07-04 DIAGNOSIS — D509 Iron deficiency anemia, unspecified: Secondary | ICD-10-CM | POA: Diagnosis not present

## 2017-07-04 DIAGNOSIS — N186 End stage renal disease: Secondary | ICD-10-CM | POA: Diagnosis not present

## 2017-07-04 DIAGNOSIS — Z5181 Encounter for therapeutic drug level monitoring: Secondary | ICD-10-CM | POA: Diagnosis not present

## 2017-07-04 DIAGNOSIS — D688 Other specified coagulation defects: Secondary | ICD-10-CM | POA: Diagnosis not present

## 2017-07-06 DIAGNOSIS — R51 Headache: Secondary | ICD-10-CM | POA: Diagnosis not present

## 2017-07-06 DIAGNOSIS — N2581 Secondary hyperparathyroidism of renal origin: Secondary | ICD-10-CM | POA: Diagnosis not present

## 2017-07-06 DIAGNOSIS — E039 Hypothyroidism, unspecified: Secondary | ICD-10-CM | POA: Diagnosis not present

## 2017-07-06 DIAGNOSIS — D688 Other specified coagulation defects: Secondary | ICD-10-CM | POA: Diagnosis not present

## 2017-07-06 DIAGNOSIS — I482 Chronic atrial fibrillation: Secondary | ICD-10-CM | POA: Diagnosis not present

## 2017-07-06 DIAGNOSIS — N186 End stage renal disease: Secondary | ICD-10-CM | POA: Diagnosis not present

## 2017-07-06 DIAGNOSIS — D509 Iron deficiency anemia, unspecified: Secondary | ICD-10-CM | POA: Diagnosis not present

## 2017-07-06 DIAGNOSIS — Z5181 Encounter for therapeutic drug level monitoring: Secondary | ICD-10-CM | POA: Diagnosis not present

## 2017-07-08 DIAGNOSIS — N186 End stage renal disease: Secondary | ICD-10-CM | POA: Diagnosis not present

## 2017-07-08 DIAGNOSIS — R51 Headache: Secondary | ICD-10-CM | POA: Diagnosis not present

## 2017-07-08 DIAGNOSIS — D509 Iron deficiency anemia, unspecified: Secondary | ICD-10-CM | POA: Diagnosis not present

## 2017-07-08 DIAGNOSIS — N2581 Secondary hyperparathyroidism of renal origin: Secondary | ICD-10-CM | POA: Diagnosis not present

## 2017-07-08 DIAGNOSIS — I482 Chronic atrial fibrillation: Secondary | ICD-10-CM | POA: Diagnosis not present

## 2017-07-08 DIAGNOSIS — Z5181 Encounter for therapeutic drug level monitoring: Secondary | ICD-10-CM | POA: Diagnosis not present

## 2017-07-08 DIAGNOSIS — E039 Hypothyroidism, unspecified: Secondary | ICD-10-CM | POA: Diagnosis not present

## 2017-07-08 DIAGNOSIS — D688 Other specified coagulation defects: Secondary | ICD-10-CM | POA: Diagnosis not present

## 2017-07-11 DIAGNOSIS — D688 Other specified coagulation defects: Secondary | ICD-10-CM | POA: Diagnosis not present

## 2017-07-11 DIAGNOSIS — I482 Chronic atrial fibrillation: Secondary | ICD-10-CM | POA: Diagnosis not present

## 2017-07-11 DIAGNOSIS — N186 End stage renal disease: Secondary | ICD-10-CM | POA: Diagnosis not present

## 2017-07-11 DIAGNOSIS — Z5181 Encounter for therapeutic drug level monitoring: Secondary | ICD-10-CM | POA: Diagnosis not present

## 2017-07-11 DIAGNOSIS — R51 Headache: Secondary | ICD-10-CM | POA: Diagnosis not present

## 2017-07-11 DIAGNOSIS — D509 Iron deficiency anemia, unspecified: Secondary | ICD-10-CM | POA: Diagnosis not present

## 2017-07-11 DIAGNOSIS — E039 Hypothyroidism, unspecified: Secondary | ICD-10-CM | POA: Diagnosis not present

## 2017-07-11 DIAGNOSIS — N2581 Secondary hyperparathyroidism of renal origin: Secondary | ICD-10-CM | POA: Diagnosis not present

## 2017-07-13 DIAGNOSIS — E039 Hypothyroidism, unspecified: Secondary | ICD-10-CM | POA: Diagnosis not present

## 2017-07-13 DIAGNOSIS — Z5181 Encounter for therapeutic drug level monitoring: Secondary | ICD-10-CM | POA: Diagnosis not present

## 2017-07-13 DIAGNOSIS — I482 Chronic atrial fibrillation: Secondary | ICD-10-CM | POA: Diagnosis not present

## 2017-07-13 DIAGNOSIS — D688 Other specified coagulation defects: Secondary | ICD-10-CM | POA: Diagnosis not present

## 2017-07-13 DIAGNOSIS — D509 Iron deficiency anemia, unspecified: Secondary | ICD-10-CM | POA: Diagnosis not present

## 2017-07-13 DIAGNOSIS — N186 End stage renal disease: Secondary | ICD-10-CM | POA: Diagnosis not present

## 2017-07-13 DIAGNOSIS — N2581 Secondary hyperparathyroidism of renal origin: Secondary | ICD-10-CM | POA: Diagnosis not present

## 2017-07-13 DIAGNOSIS — R51 Headache: Secondary | ICD-10-CM | POA: Diagnosis not present

## 2017-07-15 DIAGNOSIS — Z5181 Encounter for therapeutic drug level monitoring: Secondary | ICD-10-CM | POA: Diagnosis not present

## 2017-07-15 DIAGNOSIS — I482 Chronic atrial fibrillation: Secondary | ICD-10-CM | POA: Diagnosis not present

## 2017-07-15 DIAGNOSIS — E039 Hypothyroidism, unspecified: Secondary | ICD-10-CM | POA: Diagnosis not present

## 2017-07-15 DIAGNOSIS — N2581 Secondary hyperparathyroidism of renal origin: Secondary | ICD-10-CM | POA: Diagnosis not present

## 2017-07-15 DIAGNOSIS — R51 Headache: Secondary | ICD-10-CM | POA: Diagnosis not present

## 2017-07-15 DIAGNOSIS — N186 End stage renal disease: Secondary | ICD-10-CM | POA: Diagnosis not present

## 2017-07-15 DIAGNOSIS — D688 Other specified coagulation defects: Secondary | ICD-10-CM | POA: Diagnosis not present

## 2017-07-15 DIAGNOSIS — D509 Iron deficiency anemia, unspecified: Secondary | ICD-10-CM | POA: Diagnosis not present

## 2017-07-18 DIAGNOSIS — D688 Other specified coagulation defects: Secondary | ICD-10-CM | POA: Diagnosis not present

## 2017-07-18 DIAGNOSIS — I482 Chronic atrial fibrillation: Secondary | ICD-10-CM | POA: Diagnosis not present

## 2017-07-18 DIAGNOSIS — R51 Headache: Secondary | ICD-10-CM | POA: Diagnosis not present

## 2017-07-18 DIAGNOSIS — N186 End stage renal disease: Secondary | ICD-10-CM | POA: Diagnosis not present

## 2017-07-18 DIAGNOSIS — N2581 Secondary hyperparathyroidism of renal origin: Secondary | ICD-10-CM | POA: Diagnosis not present

## 2017-07-18 DIAGNOSIS — D509 Iron deficiency anemia, unspecified: Secondary | ICD-10-CM | POA: Diagnosis not present

## 2017-07-18 DIAGNOSIS — Z5181 Encounter for therapeutic drug level monitoring: Secondary | ICD-10-CM | POA: Diagnosis not present

## 2017-07-18 DIAGNOSIS — E039 Hypothyroidism, unspecified: Secondary | ICD-10-CM | POA: Diagnosis not present

## 2017-07-20 DIAGNOSIS — N186 End stage renal disease: Secondary | ICD-10-CM | POA: Diagnosis not present

## 2017-07-20 DIAGNOSIS — Z992 Dependence on renal dialysis: Secondary | ICD-10-CM | POA: Diagnosis not present

## 2017-07-20 DIAGNOSIS — I129 Hypertensive chronic kidney disease with stage 1 through stage 4 chronic kidney disease, or unspecified chronic kidney disease: Secondary | ICD-10-CM | POA: Diagnosis not present

## 2017-07-20 DIAGNOSIS — I482 Chronic atrial fibrillation: Secondary | ICD-10-CM | POA: Diagnosis not present

## 2017-07-20 DIAGNOSIS — Z5181 Encounter for therapeutic drug level monitoring: Secondary | ICD-10-CM | POA: Diagnosis not present

## 2017-07-20 DIAGNOSIS — R51 Headache: Secondary | ICD-10-CM | POA: Diagnosis not present

## 2017-07-20 DIAGNOSIS — D688 Other specified coagulation defects: Secondary | ICD-10-CM | POA: Diagnosis not present

## 2017-07-20 DIAGNOSIS — N2581 Secondary hyperparathyroidism of renal origin: Secondary | ICD-10-CM | POA: Diagnosis not present

## 2017-07-20 DIAGNOSIS — D509 Iron deficiency anemia, unspecified: Secondary | ICD-10-CM | POA: Diagnosis not present

## 2017-07-20 DIAGNOSIS — E039 Hypothyroidism, unspecified: Secondary | ICD-10-CM | POA: Diagnosis not present

## 2017-07-21 ENCOUNTER — Ambulatory Visit (INDEPENDENT_AMBULATORY_CARE_PROVIDER_SITE_OTHER): Payer: Medicare Other | Admitting: *Deleted

## 2017-07-21 DIAGNOSIS — I480 Paroxysmal atrial fibrillation: Secondary | ICD-10-CM | POA: Diagnosis not present

## 2017-07-21 DIAGNOSIS — I4891 Unspecified atrial fibrillation: Secondary | ICD-10-CM | POA: Diagnosis not present

## 2017-07-21 DIAGNOSIS — I129 Hypertensive chronic kidney disease with stage 1 through stage 4 chronic kidney disease, or unspecified chronic kidney disease: Secondary | ICD-10-CM | POA: Diagnosis not present

## 2017-07-21 DIAGNOSIS — Z5181 Encounter for therapeutic drug level monitoring: Secondary | ICD-10-CM

## 2017-07-21 DIAGNOSIS — N186 End stage renal disease: Secondary | ICD-10-CM | POA: Diagnosis not present

## 2017-07-21 DIAGNOSIS — Z992 Dependence on renal dialysis: Secondary | ICD-10-CM | POA: Diagnosis not present

## 2017-07-21 LAB — POCT INR: INR: 2.3

## 2017-07-21 NOTE — Patient Instructions (Signed)
Description   Continue taking 1/2 tablet daily except 1 tablet on Mondays, Wednesdays, and Fridays.  Call [facility] Clinic [phone], with any new medications or if scheduled for any procedures.  Recheck in 5 weeks.

## 2017-07-22 DIAGNOSIS — I482 Chronic atrial fibrillation: Secondary | ICD-10-CM | POA: Diagnosis not present

## 2017-07-22 DIAGNOSIS — N186 End stage renal disease: Secondary | ICD-10-CM | POA: Diagnosis not present

## 2017-07-22 DIAGNOSIS — D631 Anemia in chronic kidney disease: Secondary | ICD-10-CM | POA: Diagnosis not present

## 2017-07-22 DIAGNOSIS — R52 Pain, unspecified: Secondary | ICD-10-CM | POA: Diagnosis not present

## 2017-07-22 DIAGNOSIS — N2581 Secondary hyperparathyroidism of renal origin: Secondary | ICD-10-CM | POA: Diagnosis not present

## 2017-07-22 DIAGNOSIS — Z5181 Encounter for therapeutic drug level monitoring: Secondary | ICD-10-CM | POA: Diagnosis not present

## 2017-07-22 DIAGNOSIS — E039 Hypothyroidism, unspecified: Secondary | ICD-10-CM | POA: Diagnosis not present

## 2017-07-22 DIAGNOSIS — D509 Iron deficiency anemia, unspecified: Secondary | ICD-10-CM | POA: Diagnosis not present

## 2017-07-22 DIAGNOSIS — D688 Other specified coagulation defects: Secondary | ICD-10-CM | POA: Diagnosis not present

## 2017-07-25 DIAGNOSIS — N186 End stage renal disease: Secondary | ICD-10-CM | POA: Diagnosis not present

## 2017-07-25 DIAGNOSIS — E039 Hypothyroidism, unspecified: Secondary | ICD-10-CM | POA: Diagnosis not present

## 2017-07-25 DIAGNOSIS — Z5181 Encounter for therapeutic drug level monitoring: Secondary | ICD-10-CM | POA: Diagnosis not present

## 2017-07-25 DIAGNOSIS — R52 Pain, unspecified: Secondary | ICD-10-CM | POA: Diagnosis not present

## 2017-07-25 DIAGNOSIS — D631 Anemia in chronic kidney disease: Secondary | ICD-10-CM | POA: Diagnosis not present

## 2017-07-25 DIAGNOSIS — N2581 Secondary hyperparathyroidism of renal origin: Secondary | ICD-10-CM | POA: Diagnosis not present

## 2017-07-25 DIAGNOSIS — D688 Other specified coagulation defects: Secondary | ICD-10-CM | POA: Diagnosis not present

## 2017-07-25 DIAGNOSIS — D509 Iron deficiency anemia, unspecified: Secondary | ICD-10-CM | POA: Diagnosis not present

## 2017-07-25 DIAGNOSIS — I482 Chronic atrial fibrillation: Secondary | ICD-10-CM | POA: Diagnosis not present

## 2017-07-27 DIAGNOSIS — Z5181 Encounter for therapeutic drug level monitoring: Secondary | ICD-10-CM | POA: Diagnosis not present

## 2017-07-27 DIAGNOSIS — N186 End stage renal disease: Secondary | ICD-10-CM | POA: Diagnosis not present

## 2017-07-27 DIAGNOSIS — R52 Pain, unspecified: Secondary | ICD-10-CM | POA: Diagnosis not present

## 2017-07-27 DIAGNOSIS — E039 Hypothyroidism, unspecified: Secondary | ICD-10-CM | POA: Diagnosis not present

## 2017-07-27 DIAGNOSIS — D631 Anemia in chronic kidney disease: Secondary | ICD-10-CM | POA: Diagnosis not present

## 2017-07-27 DIAGNOSIS — D509 Iron deficiency anemia, unspecified: Secondary | ICD-10-CM | POA: Diagnosis not present

## 2017-07-27 DIAGNOSIS — I482 Chronic atrial fibrillation: Secondary | ICD-10-CM | POA: Diagnosis not present

## 2017-07-27 DIAGNOSIS — D688 Other specified coagulation defects: Secondary | ICD-10-CM | POA: Diagnosis not present

## 2017-07-27 DIAGNOSIS — N2581 Secondary hyperparathyroidism of renal origin: Secondary | ICD-10-CM | POA: Diagnosis not present

## 2017-07-29 DIAGNOSIS — E039 Hypothyroidism, unspecified: Secondary | ICD-10-CM | POA: Diagnosis not present

## 2017-07-29 DIAGNOSIS — N186 End stage renal disease: Secondary | ICD-10-CM | POA: Diagnosis not present

## 2017-07-29 DIAGNOSIS — Z5181 Encounter for therapeutic drug level monitoring: Secondary | ICD-10-CM | POA: Diagnosis not present

## 2017-07-29 DIAGNOSIS — N2581 Secondary hyperparathyroidism of renal origin: Secondary | ICD-10-CM | POA: Diagnosis not present

## 2017-07-29 DIAGNOSIS — I482 Chronic atrial fibrillation: Secondary | ICD-10-CM | POA: Diagnosis not present

## 2017-07-29 DIAGNOSIS — R52 Pain, unspecified: Secondary | ICD-10-CM | POA: Diagnosis not present

## 2017-07-29 DIAGNOSIS — D509 Iron deficiency anemia, unspecified: Secondary | ICD-10-CM | POA: Diagnosis not present

## 2017-07-29 DIAGNOSIS — D688 Other specified coagulation defects: Secondary | ICD-10-CM | POA: Diagnosis not present

## 2017-07-29 DIAGNOSIS — D631 Anemia in chronic kidney disease: Secondary | ICD-10-CM | POA: Diagnosis not present

## 2017-08-01 DIAGNOSIS — N186 End stage renal disease: Secondary | ICD-10-CM | POA: Diagnosis not present

## 2017-08-01 DIAGNOSIS — E039 Hypothyroidism, unspecified: Secondary | ICD-10-CM | POA: Diagnosis not present

## 2017-08-01 DIAGNOSIS — I482 Chronic atrial fibrillation: Secondary | ICD-10-CM | POA: Diagnosis not present

## 2017-08-01 DIAGNOSIS — D688 Other specified coagulation defects: Secondary | ICD-10-CM | POA: Diagnosis not present

## 2017-08-01 DIAGNOSIS — N2581 Secondary hyperparathyroidism of renal origin: Secondary | ICD-10-CM | POA: Diagnosis not present

## 2017-08-01 DIAGNOSIS — Z5181 Encounter for therapeutic drug level monitoring: Secondary | ICD-10-CM | POA: Diagnosis not present

## 2017-08-01 DIAGNOSIS — D509 Iron deficiency anemia, unspecified: Secondary | ICD-10-CM | POA: Diagnosis not present

## 2017-08-01 DIAGNOSIS — D631 Anemia in chronic kidney disease: Secondary | ICD-10-CM | POA: Diagnosis not present

## 2017-08-01 DIAGNOSIS — R52 Pain, unspecified: Secondary | ICD-10-CM | POA: Diagnosis not present

## 2017-08-03 DIAGNOSIS — D688 Other specified coagulation defects: Secondary | ICD-10-CM | POA: Diagnosis not present

## 2017-08-03 DIAGNOSIS — N2581 Secondary hyperparathyroidism of renal origin: Secondary | ICD-10-CM | POA: Diagnosis not present

## 2017-08-03 DIAGNOSIS — N186 End stage renal disease: Secondary | ICD-10-CM | POA: Diagnosis not present

## 2017-08-03 DIAGNOSIS — D509 Iron deficiency anemia, unspecified: Secondary | ICD-10-CM | POA: Diagnosis not present

## 2017-08-03 DIAGNOSIS — I482 Chronic atrial fibrillation: Secondary | ICD-10-CM | POA: Diagnosis not present

## 2017-08-03 DIAGNOSIS — E039 Hypothyroidism, unspecified: Secondary | ICD-10-CM | POA: Diagnosis not present

## 2017-08-03 DIAGNOSIS — R52 Pain, unspecified: Secondary | ICD-10-CM | POA: Diagnosis not present

## 2017-08-03 DIAGNOSIS — Z5181 Encounter for therapeutic drug level monitoring: Secondary | ICD-10-CM | POA: Diagnosis not present

## 2017-08-03 DIAGNOSIS — D631 Anemia in chronic kidney disease: Secondary | ICD-10-CM | POA: Diagnosis not present

## 2017-08-05 DIAGNOSIS — D509 Iron deficiency anemia, unspecified: Secondary | ICD-10-CM | POA: Diagnosis not present

## 2017-08-05 DIAGNOSIS — R52 Pain, unspecified: Secondary | ICD-10-CM | POA: Diagnosis not present

## 2017-08-05 DIAGNOSIS — E039 Hypothyroidism, unspecified: Secondary | ICD-10-CM | POA: Diagnosis not present

## 2017-08-05 DIAGNOSIS — N2581 Secondary hyperparathyroidism of renal origin: Secondary | ICD-10-CM | POA: Diagnosis not present

## 2017-08-05 DIAGNOSIS — I482 Chronic atrial fibrillation: Secondary | ICD-10-CM | POA: Diagnosis not present

## 2017-08-05 DIAGNOSIS — Z5181 Encounter for therapeutic drug level monitoring: Secondary | ICD-10-CM | POA: Diagnosis not present

## 2017-08-05 DIAGNOSIS — D631 Anemia in chronic kidney disease: Secondary | ICD-10-CM | POA: Diagnosis not present

## 2017-08-05 DIAGNOSIS — D688 Other specified coagulation defects: Secondary | ICD-10-CM | POA: Diagnosis not present

## 2017-08-05 DIAGNOSIS — N186 End stage renal disease: Secondary | ICD-10-CM | POA: Diagnosis not present

## 2017-08-08 DIAGNOSIS — E039 Hypothyroidism, unspecified: Secondary | ICD-10-CM | POA: Diagnosis not present

## 2017-08-08 DIAGNOSIS — R52 Pain, unspecified: Secondary | ICD-10-CM | POA: Diagnosis not present

## 2017-08-08 DIAGNOSIS — I482 Chronic atrial fibrillation: Secondary | ICD-10-CM | POA: Diagnosis not present

## 2017-08-08 DIAGNOSIS — D631 Anemia in chronic kidney disease: Secondary | ICD-10-CM | POA: Diagnosis not present

## 2017-08-08 DIAGNOSIS — N186 End stage renal disease: Secondary | ICD-10-CM | POA: Diagnosis not present

## 2017-08-08 DIAGNOSIS — D509 Iron deficiency anemia, unspecified: Secondary | ICD-10-CM | POA: Diagnosis not present

## 2017-08-08 DIAGNOSIS — Z5181 Encounter for therapeutic drug level monitoring: Secondary | ICD-10-CM | POA: Diagnosis not present

## 2017-08-08 DIAGNOSIS — N2581 Secondary hyperparathyroidism of renal origin: Secondary | ICD-10-CM | POA: Diagnosis not present

## 2017-08-08 DIAGNOSIS — D688 Other specified coagulation defects: Secondary | ICD-10-CM | POA: Diagnosis not present

## 2017-08-09 DIAGNOSIS — I5032 Chronic diastolic (congestive) heart failure: Secondary | ICD-10-CM | POA: Diagnosis not present

## 2017-08-09 DIAGNOSIS — I129 Hypertensive chronic kidney disease with stage 1 through stage 4 chronic kidney disease, or unspecified chronic kidney disease: Secondary | ICD-10-CM | POA: Diagnosis not present

## 2017-08-09 DIAGNOSIS — R110 Nausea: Secondary | ICD-10-CM | POA: Diagnosis not present

## 2017-08-09 DIAGNOSIS — N186 End stage renal disease: Secondary | ICD-10-CM | POA: Diagnosis not present

## 2017-08-10 DIAGNOSIS — Z5181 Encounter for therapeutic drug level monitoring: Secondary | ICD-10-CM | POA: Diagnosis not present

## 2017-08-10 DIAGNOSIS — D509 Iron deficiency anemia, unspecified: Secondary | ICD-10-CM | POA: Diagnosis not present

## 2017-08-10 DIAGNOSIS — D688 Other specified coagulation defects: Secondary | ICD-10-CM | POA: Diagnosis not present

## 2017-08-10 DIAGNOSIS — N2581 Secondary hyperparathyroidism of renal origin: Secondary | ICD-10-CM | POA: Diagnosis not present

## 2017-08-10 DIAGNOSIS — D631 Anemia in chronic kidney disease: Secondary | ICD-10-CM | POA: Diagnosis not present

## 2017-08-10 DIAGNOSIS — E039 Hypothyroidism, unspecified: Secondary | ICD-10-CM | POA: Diagnosis not present

## 2017-08-10 DIAGNOSIS — N186 End stage renal disease: Secondary | ICD-10-CM | POA: Diagnosis not present

## 2017-08-10 DIAGNOSIS — I482 Chronic atrial fibrillation: Secondary | ICD-10-CM | POA: Diagnosis not present

## 2017-08-10 DIAGNOSIS — R52 Pain, unspecified: Secondary | ICD-10-CM | POA: Diagnosis not present

## 2017-08-12 DIAGNOSIS — E039 Hypothyroidism, unspecified: Secondary | ICD-10-CM | POA: Diagnosis not present

## 2017-08-12 DIAGNOSIS — D631 Anemia in chronic kidney disease: Secondary | ICD-10-CM | POA: Diagnosis not present

## 2017-08-12 DIAGNOSIS — I482 Chronic atrial fibrillation: Secondary | ICD-10-CM | POA: Diagnosis not present

## 2017-08-12 DIAGNOSIS — D688 Other specified coagulation defects: Secondary | ICD-10-CM | POA: Diagnosis not present

## 2017-08-12 DIAGNOSIS — Z5181 Encounter for therapeutic drug level monitoring: Secondary | ICD-10-CM | POA: Diagnosis not present

## 2017-08-12 DIAGNOSIS — D509 Iron deficiency anemia, unspecified: Secondary | ICD-10-CM | POA: Diagnosis not present

## 2017-08-12 DIAGNOSIS — N186 End stage renal disease: Secondary | ICD-10-CM | POA: Diagnosis not present

## 2017-08-12 DIAGNOSIS — R52 Pain, unspecified: Secondary | ICD-10-CM | POA: Diagnosis not present

## 2017-08-12 DIAGNOSIS — N2581 Secondary hyperparathyroidism of renal origin: Secondary | ICD-10-CM | POA: Diagnosis not present

## 2017-08-15 DIAGNOSIS — D631 Anemia in chronic kidney disease: Secondary | ICD-10-CM | POA: Diagnosis not present

## 2017-08-15 DIAGNOSIS — Z5181 Encounter for therapeutic drug level monitoring: Secondary | ICD-10-CM | POA: Diagnosis not present

## 2017-08-15 DIAGNOSIS — N2581 Secondary hyperparathyroidism of renal origin: Secondary | ICD-10-CM | POA: Diagnosis not present

## 2017-08-15 DIAGNOSIS — R52 Pain, unspecified: Secondary | ICD-10-CM | POA: Diagnosis not present

## 2017-08-15 DIAGNOSIS — I482 Chronic atrial fibrillation: Secondary | ICD-10-CM | POA: Diagnosis not present

## 2017-08-15 DIAGNOSIS — E039 Hypothyroidism, unspecified: Secondary | ICD-10-CM | POA: Diagnosis not present

## 2017-08-15 DIAGNOSIS — N186 End stage renal disease: Secondary | ICD-10-CM | POA: Diagnosis not present

## 2017-08-15 DIAGNOSIS — D688 Other specified coagulation defects: Secondary | ICD-10-CM | POA: Diagnosis not present

## 2017-08-15 DIAGNOSIS — D509 Iron deficiency anemia, unspecified: Secondary | ICD-10-CM | POA: Diagnosis not present

## 2017-08-17 DIAGNOSIS — D688 Other specified coagulation defects: Secondary | ICD-10-CM | POA: Diagnosis not present

## 2017-08-17 DIAGNOSIS — R52 Pain, unspecified: Secondary | ICD-10-CM | POA: Diagnosis not present

## 2017-08-17 DIAGNOSIS — N186 End stage renal disease: Secondary | ICD-10-CM | POA: Diagnosis not present

## 2017-08-17 DIAGNOSIS — N2581 Secondary hyperparathyroidism of renal origin: Secondary | ICD-10-CM | POA: Diagnosis not present

## 2017-08-17 DIAGNOSIS — D509 Iron deficiency anemia, unspecified: Secondary | ICD-10-CM | POA: Diagnosis not present

## 2017-08-17 DIAGNOSIS — Z5181 Encounter for therapeutic drug level monitoring: Secondary | ICD-10-CM | POA: Diagnosis not present

## 2017-08-17 DIAGNOSIS — E039 Hypothyroidism, unspecified: Secondary | ICD-10-CM | POA: Diagnosis not present

## 2017-08-17 DIAGNOSIS — D631 Anemia in chronic kidney disease: Secondary | ICD-10-CM | POA: Diagnosis not present

## 2017-08-17 DIAGNOSIS — I482 Chronic atrial fibrillation: Secondary | ICD-10-CM | POA: Diagnosis not present

## 2017-08-19 DIAGNOSIS — D509 Iron deficiency anemia, unspecified: Secondary | ICD-10-CM | POA: Diagnosis not present

## 2017-08-19 DIAGNOSIS — D688 Other specified coagulation defects: Secondary | ICD-10-CM | POA: Diagnosis not present

## 2017-08-19 DIAGNOSIS — N186 End stage renal disease: Secondary | ICD-10-CM | POA: Diagnosis not present

## 2017-08-19 DIAGNOSIS — N2581 Secondary hyperparathyroidism of renal origin: Secondary | ICD-10-CM | POA: Diagnosis not present

## 2017-08-19 DIAGNOSIS — Z5181 Encounter for therapeutic drug level monitoring: Secondary | ICD-10-CM | POA: Diagnosis not present

## 2017-08-19 DIAGNOSIS — R51 Headache: Secondary | ICD-10-CM | POA: Diagnosis not present

## 2017-08-19 DIAGNOSIS — E039 Hypothyroidism, unspecified: Secondary | ICD-10-CM | POA: Diagnosis not present

## 2017-08-19 DIAGNOSIS — I482 Chronic atrial fibrillation: Secondary | ICD-10-CM | POA: Diagnosis not present

## 2017-08-22 DIAGNOSIS — Z5181 Encounter for therapeutic drug level monitoring: Secondary | ICD-10-CM | POA: Diagnosis not present

## 2017-08-22 DIAGNOSIS — R51 Headache: Secondary | ICD-10-CM | POA: Diagnosis not present

## 2017-08-22 DIAGNOSIS — I482 Chronic atrial fibrillation: Secondary | ICD-10-CM | POA: Diagnosis not present

## 2017-08-22 DIAGNOSIS — D688 Other specified coagulation defects: Secondary | ICD-10-CM | POA: Diagnosis not present

## 2017-08-22 DIAGNOSIS — N186 End stage renal disease: Secondary | ICD-10-CM | POA: Diagnosis not present

## 2017-08-22 DIAGNOSIS — N2581 Secondary hyperparathyroidism of renal origin: Secondary | ICD-10-CM | POA: Diagnosis not present

## 2017-08-22 DIAGNOSIS — E039 Hypothyroidism, unspecified: Secondary | ICD-10-CM | POA: Diagnosis not present

## 2017-08-22 DIAGNOSIS — D509 Iron deficiency anemia, unspecified: Secondary | ICD-10-CM | POA: Diagnosis not present

## 2017-08-24 DIAGNOSIS — Z5181 Encounter for therapeutic drug level monitoring: Secondary | ICD-10-CM | POA: Diagnosis not present

## 2017-08-24 DIAGNOSIS — E039 Hypothyroidism, unspecified: Secondary | ICD-10-CM | POA: Diagnosis not present

## 2017-08-24 DIAGNOSIS — N2581 Secondary hyperparathyroidism of renal origin: Secondary | ICD-10-CM | POA: Diagnosis not present

## 2017-08-24 DIAGNOSIS — N186 End stage renal disease: Secondary | ICD-10-CM | POA: Diagnosis not present

## 2017-08-24 DIAGNOSIS — I482 Chronic atrial fibrillation: Secondary | ICD-10-CM | POA: Diagnosis not present

## 2017-08-24 DIAGNOSIS — D688 Other specified coagulation defects: Secondary | ICD-10-CM | POA: Diagnosis not present

## 2017-08-24 DIAGNOSIS — D509 Iron deficiency anemia, unspecified: Secondary | ICD-10-CM | POA: Diagnosis not present

## 2017-08-24 DIAGNOSIS — R51 Headache: Secondary | ICD-10-CM | POA: Diagnosis not present

## 2017-08-25 ENCOUNTER — Ambulatory Visit: Payer: Medicare Other | Admitting: *Deleted

## 2017-08-25 DIAGNOSIS — I4891 Unspecified atrial fibrillation: Secondary | ICD-10-CM | POA: Diagnosis not present

## 2017-08-25 DIAGNOSIS — Z5181 Encounter for therapeutic drug level monitoring: Secondary | ICD-10-CM | POA: Diagnosis not present

## 2017-08-25 DIAGNOSIS — I480 Paroxysmal atrial fibrillation: Secondary | ICD-10-CM

## 2017-08-25 LAB — POCT INR: INR: 1.9

## 2017-08-25 NOTE — Patient Instructions (Signed)
Description   Today take 1.5 tablets then continue taking 1/2 tablet daily except 1 tablet on Mondays, Wednesdays, and Fridays.  Call [facility] Clinic [phone], with any new medications or if scheduled for any procedures.  Recheck in 4 weeks.

## 2017-08-26 DIAGNOSIS — I482 Chronic atrial fibrillation: Secondary | ICD-10-CM | POA: Diagnosis not present

## 2017-08-26 DIAGNOSIS — N2581 Secondary hyperparathyroidism of renal origin: Secondary | ICD-10-CM | POA: Diagnosis not present

## 2017-08-26 DIAGNOSIS — D688 Other specified coagulation defects: Secondary | ICD-10-CM | POA: Diagnosis not present

## 2017-08-26 DIAGNOSIS — D509 Iron deficiency anemia, unspecified: Secondary | ICD-10-CM | POA: Diagnosis not present

## 2017-08-26 DIAGNOSIS — R51 Headache: Secondary | ICD-10-CM | POA: Diagnosis not present

## 2017-08-26 DIAGNOSIS — E039 Hypothyroidism, unspecified: Secondary | ICD-10-CM | POA: Diagnosis not present

## 2017-08-26 DIAGNOSIS — N186 End stage renal disease: Secondary | ICD-10-CM | POA: Diagnosis not present

## 2017-08-26 DIAGNOSIS — Z5181 Encounter for therapeutic drug level monitoring: Secondary | ICD-10-CM | POA: Diagnosis not present

## 2017-08-29 DIAGNOSIS — E039 Hypothyroidism, unspecified: Secondary | ICD-10-CM | POA: Diagnosis not present

## 2017-08-29 DIAGNOSIS — Z5181 Encounter for therapeutic drug level monitoring: Secondary | ICD-10-CM | POA: Diagnosis not present

## 2017-08-29 DIAGNOSIS — D509 Iron deficiency anemia, unspecified: Secondary | ICD-10-CM | POA: Diagnosis not present

## 2017-08-29 DIAGNOSIS — I482 Chronic atrial fibrillation: Secondary | ICD-10-CM | POA: Diagnosis not present

## 2017-08-29 DIAGNOSIS — N2581 Secondary hyperparathyroidism of renal origin: Secondary | ICD-10-CM | POA: Diagnosis not present

## 2017-08-29 DIAGNOSIS — D688 Other specified coagulation defects: Secondary | ICD-10-CM | POA: Diagnosis not present

## 2017-08-29 DIAGNOSIS — R51 Headache: Secondary | ICD-10-CM | POA: Diagnosis not present

## 2017-08-29 DIAGNOSIS — N186 End stage renal disease: Secondary | ICD-10-CM | POA: Diagnosis not present

## 2017-08-30 DIAGNOSIS — J069 Acute upper respiratory infection, unspecified: Secondary | ICD-10-CM | POA: Diagnosis not present

## 2017-08-30 DIAGNOSIS — R05 Cough: Secondary | ICD-10-CM | POA: Diagnosis not present

## 2017-08-30 DIAGNOSIS — J3489 Other specified disorders of nose and nasal sinuses: Secondary | ICD-10-CM | POA: Diagnosis not present

## 2017-08-31 DIAGNOSIS — D509 Iron deficiency anemia, unspecified: Secondary | ICD-10-CM | POA: Diagnosis not present

## 2017-08-31 DIAGNOSIS — Z5181 Encounter for therapeutic drug level monitoring: Secondary | ICD-10-CM | POA: Diagnosis not present

## 2017-08-31 DIAGNOSIS — N186 End stage renal disease: Secondary | ICD-10-CM | POA: Diagnosis not present

## 2017-08-31 DIAGNOSIS — R51 Headache: Secondary | ICD-10-CM | POA: Diagnosis not present

## 2017-08-31 DIAGNOSIS — D688 Other specified coagulation defects: Secondary | ICD-10-CM | POA: Diagnosis not present

## 2017-08-31 DIAGNOSIS — N2581 Secondary hyperparathyroidism of renal origin: Secondary | ICD-10-CM | POA: Diagnosis not present

## 2017-08-31 DIAGNOSIS — E039 Hypothyroidism, unspecified: Secondary | ICD-10-CM | POA: Diagnosis not present

## 2017-08-31 DIAGNOSIS — I482 Chronic atrial fibrillation: Secondary | ICD-10-CM | POA: Diagnosis not present

## 2017-09-01 DIAGNOSIS — R300 Dysuria: Secondary | ICD-10-CM | POA: Diagnosis not present

## 2017-09-02 DIAGNOSIS — D688 Other specified coagulation defects: Secondary | ICD-10-CM | POA: Diagnosis not present

## 2017-09-02 DIAGNOSIS — Z5181 Encounter for therapeutic drug level monitoring: Secondary | ICD-10-CM | POA: Diagnosis not present

## 2017-09-02 DIAGNOSIS — N2581 Secondary hyperparathyroidism of renal origin: Secondary | ICD-10-CM | POA: Diagnosis not present

## 2017-09-02 DIAGNOSIS — R51 Headache: Secondary | ICD-10-CM | POA: Diagnosis not present

## 2017-09-02 DIAGNOSIS — D509 Iron deficiency anemia, unspecified: Secondary | ICD-10-CM | POA: Diagnosis not present

## 2017-09-02 DIAGNOSIS — I482 Chronic atrial fibrillation: Secondary | ICD-10-CM | POA: Diagnosis not present

## 2017-09-02 DIAGNOSIS — E039 Hypothyroidism, unspecified: Secondary | ICD-10-CM | POA: Diagnosis not present

## 2017-09-02 DIAGNOSIS — N186 End stage renal disease: Secondary | ICD-10-CM | POA: Diagnosis not present

## 2017-09-05 DIAGNOSIS — R51 Headache: Secondary | ICD-10-CM | POA: Diagnosis not present

## 2017-09-05 DIAGNOSIS — I482 Chronic atrial fibrillation: Secondary | ICD-10-CM | POA: Diagnosis not present

## 2017-09-05 DIAGNOSIS — N186 End stage renal disease: Secondary | ICD-10-CM | POA: Diagnosis not present

## 2017-09-05 DIAGNOSIS — Z5181 Encounter for therapeutic drug level monitoring: Secondary | ICD-10-CM | POA: Diagnosis not present

## 2017-09-05 DIAGNOSIS — D509 Iron deficiency anemia, unspecified: Secondary | ICD-10-CM | POA: Diagnosis not present

## 2017-09-05 DIAGNOSIS — D688 Other specified coagulation defects: Secondary | ICD-10-CM | POA: Diagnosis not present

## 2017-09-05 DIAGNOSIS — N2581 Secondary hyperparathyroidism of renal origin: Secondary | ICD-10-CM | POA: Diagnosis not present

## 2017-09-05 DIAGNOSIS — E039 Hypothyroidism, unspecified: Secondary | ICD-10-CM | POA: Diagnosis not present

## 2017-09-07 DIAGNOSIS — Z5181 Encounter for therapeutic drug level monitoring: Secondary | ICD-10-CM | POA: Diagnosis not present

## 2017-09-07 DIAGNOSIS — D509 Iron deficiency anemia, unspecified: Secondary | ICD-10-CM | POA: Diagnosis not present

## 2017-09-07 DIAGNOSIS — R51 Headache: Secondary | ICD-10-CM | POA: Diagnosis not present

## 2017-09-07 DIAGNOSIS — D688 Other specified coagulation defects: Secondary | ICD-10-CM | POA: Diagnosis not present

## 2017-09-07 DIAGNOSIS — I482 Chronic atrial fibrillation: Secondary | ICD-10-CM | POA: Diagnosis not present

## 2017-09-07 DIAGNOSIS — N2581 Secondary hyperparathyroidism of renal origin: Secondary | ICD-10-CM | POA: Diagnosis not present

## 2017-09-07 DIAGNOSIS — N186 End stage renal disease: Secondary | ICD-10-CM | POA: Diagnosis not present

## 2017-09-07 DIAGNOSIS — E039 Hypothyroidism, unspecified: Secondary | ICD-10-CM | POA: Diagnosis not present

## 2017-09-08 ENCOUNTER — Ambulatory Visit: Payer: Medicare Other | Admitting: Pharmacist

## 2017-09-08 DIAGNOSIS — I480 Paroxysmal atrial fibrillation: Secondary | ICD-10-CM | POA: Diagnosis not present

## 2017-09-08 DIAGNOSIS — Z5181 Encounter for therapeutic drug level monitoring: Secondary | ICD-10-CM

## 2017-09-08 DIAGNOSIS — I4891 Unspecified atrial fibrillation: Secondary | ICD-10-CM | POA: Diagnosis not present

## 2017-09-08 LAB — PROTIME-INR
INR: 5.3 (ref 0.8–1.2)
Prothrombin Time: 55.9 s — ABNORMAL HIGH (ref 9.1–12.0)

## 2017-09-08 LAB — POCT INR: INR: 6.3

## 2017-09-08 NOTE — Patient Instructions (Signed)
Description   Do not take any Coumadin until we call you. Go to the ER with any bleeding.     

## 2017-09-09 DIAGNOSIS — R51 Headache: Secondary | ICD-10-CM | POA: Diagnosis not present

## 2017-09-09 DIAGNOSIS — I482 Chronic atrial fibrillation: Secondary | ICD-10-CM | POA: Diagnosis not present

## 2017-09-09 DIAGNOSIS — N2581 Secondary hyperparathyroidism of renal origin: Secondary | ICD-10-CM | POA: Diagnosis not present

## 2017-09-09 DIAGNOSIS — D688 Other specified coagulation defects: Secondary | ICD-10-CM | POA: Diagnosis not present

## 2017-09-09 DIAGNOSIS — Z5181 Encounter for therapeutic drug level monitoring: Secondary | ICD-10-CM | POA: Diagnosis not present

## 2017-09-09 DIAGNOSIS — N186 End stage renal disease: Secondary | ICD-10-CM | POA: Diagnosis not present

## 2017-09-09 DIAGNOSIS — D509 Iron deficiency anemia, unspecified: Secondary | ICD-10-CM | POA: Diagnosis not present

## 2017-09-09 DIAGNOSIS — E039 Hypothyroidism, unspecified: Secondary | ICD-10-CM | POA: Diagnosis not present

## 2017-09-12 DIAGNOSIS — D688 Other specified coagulation defects: Secondary | ICD-10-CM | POA: Diagnosis not present

## 2017-09-12 DIAGNOSIS — E039 Hypothyroidism, unspecified: Secondary | ICD-10-CM | POA: Diagnosis not present

## 2017-09-12 DIAGNOSIS — Z5181 Encounter for therapeutic drug level monitoring: Secondary | ICD-10-CM | POA: Diagnosis not present

## 2017-09-12 DIAGNOSIS — I482 Chronic atrial fibrillation: Secondary | ICD-10-CM | POA: Diagnosis not present

## 2017-09-12 DIAGNOSIS — D509 Iron deficiency anemia, unspecified: Secondary | ICD-10-CM | POA: Diagnosis not present

## 2017-09-12 DIAGNOSIS — N2581 Secondary hyperparathyroidism of renal origin: Secondary | ICD-10-CM | POA: Diagnosis not present

## 2017-09-12 DIAGNOSIS — R51 Headache: Secondary | ICD-10-CM | POA: Diagnosis not present

## 2017-09-12 DIAGNOSIS — N186 End stage renal disease: Secondary | ICD-10-CM | POA: Diagnosis not present

## 2017-09-14 DIAGNOSIS — D509 Iron deficiency anemia, unspecified: Secondary | ICD-10-CM | POA: Diagnosis not present

## 2017-09-14 DIAGNOSIS — N186 End stage renal disease: Secondary | ICD-10-CM | POA: Diagnosis not present

## 2017-09-14 DIAGNOSIS — E039 Hypothyroidism, unspecified: Secondary | ICD-10-CM | POA: Diagnosis not present

## 2017-09-14 DIAGNOSIS — Z5181 Encounter for therapeutic drug level monitoring: Secondary | ICD-10-CM | POA: Diagnosis not present

## 2017-09-14 DIAGNOSIS — D688 Other specified coagulation defects: Secondary | ICD-10-CM | POA: Diagnosis not present

## 2017-09-14 DIAGNOSIS — R51 Headache: Secondary | ICD-10-CM | POA: Diagnosis not present

## 2017-09-14 DIAGNOSIS — I482 Chronic atrial fibrillation: Secondary | ICD-10-CM | POA: Diagnosis not present

## 2017-09-14 DIAGNOSIS — N2581 Secondary hyperparathyroidism of renal origin: Secondary | ICD-10-CM | POA: Diagnosis not present

## 2017-09-15 ENCOUNTER — Ambulatory Visit: Payer: Medicare Other | Admitting: *Deleted

## 2017-09-15 DIAGNOSIS — I480 Paroxysmal atrial fibrillation: Secondary | ICD-10-CM

## 2017-09-15 DIAGNOSIS — I4891 Unspecified atrial fibrillation: Secondary | ICD-10-CM | POA: Diagnosis not present

## 2017-09-15 DIAGNOSIS — Z5181 Encounter for therapeutic drug level monitoring: Secondary | ICD-10-CM

## 2017-09-15 LAB — POCT INR: INR: 1.4

## 2017-09-15 NOTE — Patient Instructions (Signed)
Description   Today take 1.5 tablets, tomorrow take 1 tablet,  then resume normal dose of 1/2 tablet daily except 1 tablet on Monday, Wednesday, and Friday. Recheck INR in 10 days.

## 2017-09-16 DIAGNOSIS — N2581 Secondary hyperparathyroidism of renal origin: Secondary | ICD-10-CM | POA: Diagnosis not present

## 2017-09-16 DIAGNOSIS — E039 Hypothyroidism, unspecified: Secondary | ICD-10-CM | POA: Diagnosis not present

## 2017-09-16 DIAGNOSIS — Z5181 Encounter for therapeutic drug level monitoring: Secondary | ICD-10-CM | POA: Diagnosis not present

## 2017-09-16 DIAGNOSIS — D509 Iron deficiency anemia, unspecified: Secondary | ICD-10-CM | POA: Diagnosis not present

## 2017-09-16 DIAGNOSIS — N186 End stage renal disease: Secondary | ICD-10-CM | POA: Diagnosis not present

## 2017-09-16 DIAGNOSIS — R51 Headache: Secondary | ICD-10-CM | POA: Diagnosis not present

## 2017-09-16 DIAGNOSIS — I482 Chronic atrial fibrillation: Secondary | ICD-10-CM | POA: Diagnosis not present

## 2017-09-16 DIAGNOSIS — D688 Other specified coagulation defects: Secondary | ICD-10-CM | POA: Diagnosis not present

## 2017-09-17 DIAGNOSIS — I129 Hypertensive chronic kidney disease with stage 1 through stage 4 chronic kidney disease, or unspecified chronic kidney disease: Secondary | ICD-10-CM | POA: Diagnosis not present

## 2017-09-17 DIAGNOSIS — N186 End stage renal disease: Secondary | ICD-10-CM | POA: Diagnosis not present

## 2017-09-17 DIAGNOSIS — Z992 Dependence on renal dialysis: Secondary | ICD-10-CM | POA: Diagnosis not present

## 2017-09-19 DIAGNOSIS — N186 End stage renal disease: Secondary | ICD-10-CM | POA: Diagnosis not present

## 2017-09-19 DIAGNOSIS — D509 Iron deficiency anemia, unspecified: Secondary | ICD-10-CM | POA: Diagnosis not present

## 2017-09-19 DIAGNOSIS — E039 Hypothyroidism, unspecified: Secondary | ICD-10-CM | POA: Diagnosis not present

## 2017-09-19 DIAGNOSIS — I482 Chronic atrial fibrillation: Secondary | ICD-10-CM | POA: Diagnosis not present

## 2017-09-19 DIAGNOSIS — N2581 Secondary hyperparathyroidism of renal origin: Secondary | ICD-10-CM | POA: Diagnosis not present

## 2017-09-19 DIAGNOSIS — Z5181 Encounter for therapeutic drug level monitoring: Secondary | ICD-10-CM | POA: Diagnosis not present

## 2017-09-19 DIAGNOSIS — R51 Headache: Secondary | ICD-10-CM | POA: Diagnosis not present

## 2017-09-19 DIAGNOSIS — D688 Other specified coagulation defects: Secondary | ICD-10-CM | POA: Diagnosis not present

## 2017-09-19 DIAGNOSIS — D631 Anemia in chronic kidney disease: Secondary | ICD-10-CM | POA: Diagnosis not present

## 2017-09-19 DIAGNOSIS — R52 Pain, unspecified: Secondary | ICD-10-CM | POA: Diagnosis not present

## 2017-09-21 DIAGNOSIS — I482 Chronic atrial fibrillation: Secondary | ICD-10-CM | POA: Diagnosis not present

## 2017-09-21 DIAGNOSIS — N186 End stage renal disease: Secondary | ICD-10-CM | POA: Diagnosis not present

## 2017-09-21 DIAGNOSIS — R52 Pain, unspecified: Secondary | ICD-10-CM | POA: Diagnosis not present

## 2017-09-21 DIAGNOSIS — N2581 Secondary hyperparathyroidism of renal origin: Secondary | ICD-10-CM | POA: Diagnosis not present

## 2017-09-21 DIAGNOSIS — D509 Iron deficiency anemia, unspecified: Secondary | ICD-10-CM | POA: Diagnosis not present

## 2017-09-21 DIAGNOSIS — E039 Hypothyroidism, unspecified: Secondary | ICD-10-CM | POA: Diagnosis not present

## 2017-09-21 DIAGNOSIS — D631 Anemia in chronic kidney disease: Secondary | ICD-10-CM | POA: Diagnosis not present

## 2017-09-21 DIAGNOSIS — Z5181 Encounter for therapeutic drug level monitoring: Secondary | ICD-10-CM | POA: Diagnosis not present

## 2017-09-21 DIAGNOSIS — R51 Headache: Secondary | ICD-10-CM | POA: Diagnosis not present

## 2017-09-21 DIAGNOSIS — D688 Other specified coagulation defects: Secondary | ICD-10-CM | POA: Diagnosis not present

## 2017-09-23 DIAGNOSIS — D509 Iron deficiency anemia, unspecified: Secondary | ICD-10-CM | POA: Diagnosis not present

## 2017-09-23 DIAGNOSIS — D631 Anemia in chronic kidney disease: Secondary | ICD-10-CM | POA: Diagnosis not present

## 2017-09-23 DIAGNOSIS — N2581 Secondary hyperparathyroidism of renal origin: Secondary | ICD-10-CM | POA: Diagnosis not present

## 2017-09-23 DIAGNOSIS — R51 Headache: Secondary | ICD-10-CM | POA: Diagnosis not present

## 2017-09-23 DIAGNOSIS — R52 Pain, unspecified: Secondary | ICD-10-CM | POA: Diagnosis not present

## 2017-09-23 DIAGNOSIS — I482 Chronic atrial fibrillation: Secondary | ICD-10-CM | POA: Diagnosis not present

## 2017-09-23 DIAGNOSIS — Z5181 Encounter for therapeutic drug level monitoring: Secondary | ICD-10-CM | POA: Diagnosis not present

## 2017-09-23 DIAGNOSIS — N186 End stage renal disease: Secondary | ICD-10-CM | POA: Diagnosis not present

## 2017-09-23 DIAGNOSIS — D688 Other specified coagulation defects: Secondary | ICD-10-CM | POA: Diagnosis not present

## 2017-09-23 DIAGNOSIS — E039 Hypothyroidism, unspecified: Secondary | ICD-10-CM | POA: Diagnosis not present

## 2017-09-25 ENCOUNTER — Ambulatory Visit: Payer: Medicare Other | Admitting: Pharmacist

## 2017-09-25 DIAGNOSIS — I480 Paroxysmal atrial fibrillation: Secondary | ICD-10-CM

## 2017-09-25 DIAGNOSIS — I4891 Unspecified atrial fibrillation: Secondary | ICD-10-CM

## 2017-09-25 DIAGNOSIS — Z5181 Encounter for therapeutic drug level monitoring: Secondary | ICD-10-CM

## 2017-09-25 LAB — POCT INR: INR: 2

## 2017-09-25 NOTE — Patient Instructions (Signed)
Continue taking 1/2 tablet daily except 1 tablet on Monday, Wednesday, and Friday. Recheck INR in 3 weeks

## 2017-09-26 DIAGNOSIS — R52 Pain, unspecified: Secondary | ICD-10-CM | POA: Diagnosis not present

## 2017-09-26 DIAGNOSIS — Z5181 Encounter for therapeutic drug level monitoring: Secondary | ICD-10-CM | POA: Diagnosis not present

## 2017-09-26 DIAGNOSIS — D631 Anemia in chronic kidney disease: Secondary | ICD-10-CM | POA: Diagnosis not present

## 2017-09-26 DIAGNOSIS — E039 Hypothyroidism, unspecified: Secondary | ICD-10-CM | POA: Diagnosis not present

## 2017-09-26 DIAGNOSIS — I482 Chronic atrial fibrillation: Secondary | ICD-10-CM | POA: Diagnosis not present

## 2017-09-26 DIAGNOSIS — R51 Headache: Secondary | ICD-10-CM | POA: Diagnosis not present

## 2017-09-26 DIAGNOSIS — N186 End stage renal disease: Secondary | ICD-10-CM | POA: Diagnosis not present

## 2017-09-26 DIAGNOSIS — D688 Other specified coagulation defects: Secondary | ICD-10-CM | POA: Diagnosis not present

## 2017-09-26 DIAGNOSIS — D509 Iron deficiency anemia, unspecified: Secondary | ICD-10-CM | POA: Diagnosis not present

## 2017-09-26 DIAGNOSIS — N2581 Secondary hyperparathyroidism of renal origin: Secondary | ICD-10-CM | POA: Diagnosis not present

## 2017-09-28 DIAGNOSIS — Z5181 Encounter for therapeutic drug level monitoring: Secondary | ICD-10-CM | POA: Diagnosis not present

## 2017-09-28 DIAGNOSIS — D631 Anemia in chronic kidney disease: Secondary | ICD-10-CM | POA: Diagnosis not present

## 2017-09-28 DIAGNOSIS — R52 Pain, unspecified: Secondary | ICD-10-CM | POA: Diagnosis not present

## 2017-09-28 DIAGNOSIS — R51 Headache: Secondary | ICD-10-CM | POA: Diagnosis not present

## 2017-09-28 DIAGNOSIS — N186 End stage renal disease: Secondary | ICD-10-CM | POA: Diagnosis not present

## 2017-09-28 DIAGNOSIS — I482 Chronic atrial fibrillation: Secondary | ICD-10-CM | POA: Diagnosis not present

## 2017-09-28 DIAGNOSIS — D509 Iron deficiency anemia, unspecified: Secondary | ICD-10-CM | POA: Diagnosis not present

## 2017-09-28 DIAGNOSIS — D688 Other specified coagulation defects: Secondary | ICD-10-CM | POA: Diagnosis not present

## 2017-09-28 DIAGNOSIS — N2581 Secondary hyperparathyroidism of renal origin: Secondary | ICD-10-CM | POA: Diagnosis not present

## 2017-09-28 DIAGNOSIS — E039 Hypothyroidism, unspecified: Secondary | ICD-10-CM | POA: Diagnosis not present

## 2017-09-30 DIAGNOSIS — I482 Chronic atrial fibrillation: Secondary | ICD-10-CM | POA: Diagnosis not present

## 2017-09-30 DIAGNOSIS — N2581 Secondary hyperparathyroidism of renal origin: Secondary | ICD-10-CM | POA: Diagnosis not present

## 2017-09-30 DIAGNOSIS — D509 Iron deficiency anemia, unspecified: Secondary | ICD-10-CM | POA: Diagnosis not present

## 2017-09-30 DIAGNOSIS — Z5181 Encounter for therapeutic drug level monitoring: Secondary | ICD-10-CM | POA: Diagnosis not present

## 2017-09-30 DIAGNOSIS — D631 Anemia in chronic kidney disease: Secondary | ICD-10-CM | POA: Diagnosis not present

## 2017-09-30 DIAGNOSIS — N186 End stage renal disease: Secondary | ICD-10-CM | POA: Diagnosis not present

## 2017-09-30 DIAGNOSIS — R51 Headache: Secondary | ICD-10-CM | POA: Diagnosis not present

## 2017-09-30 DIAGNOSIS — R52 Pain, unspecified: Secondary | ICD-10-CM | POA: Diagnosis not present

## 2017-09-30 DIAGNOSIS — E039 Hypothyroidism, unspecified: Secondary | ICD-10-CM | POA: Diagnosis not present

## 2017-09-30 DIAGNOSIS — D688 Other specified coagulation defects: Secondary | ICD-10-CM | POA: Diagnosis not present

## 2017-10-03 DIAGNOSIS — Z5181 Encounter for therapeutic drug level monitoring: Secondary | ICD-10-CM | POA: Diagnosis not present

## 2017-10-03 DIAGNOSIS — E039 Hypothyroidism, unspecified: Secondary | ICD-10-CM | POA: Diagnosis not present

## 2017-10-03 DIAGNOSIS — N2581 Secondary hyperparathyroidism of renal origin: Secondary | ICD-10-CM | POA: Diagnosis not present

## 2017-10-03 DIAGNOSIS — R52 Pain, unspecified: Secondary | ICD-10-CM | POA: Diagnosis not present

## 2017-10-03 DIAGNOSIS — R51 Headache: Secondary | ICD-10-CM | POA: Diagnosis not present

## 2017-10-03 DIAGNOSIS — D688 Other specified coagulation defects: Secondary | ICD-10-CM | POA: Diagnosis not present

## 2017-10-03 DIAGNOSIS — N186 End stage renal disease: Secondary | ICD-10-CM | POA: Diagnosis not present

## 2017-10-03 DIAGNOSIS — D631 Anemia in chronic kidney disease: Secondary | ICD-10-CM | POA: Diagnosis not present

## 2017-10-03 DIAGNOSIS — D509 Iron deficiency anemia, unspecified: Secondary | ICD-10-CM | POA: Diagnosis not present

## 2017-10-03 DIAGNOSIS — I482 Chronic atrial fibrillation: Secondary | ICD-10-CM | POA: Diagnosis not present

## 2017-10-05 DIAGNOSIS — R52 Pain, unspecified: Secondary | ICD-10-CM | POA: Diagnosis not present

## 2017-10-05 DIAGNOSIS — I482 Chronic atrial fibrillation: Secondary | ICD-10-CM | POA: Diagnosis not present

## 2017-10-05 DIAGNOSIS — Z5181 Encounter for therapeutic drug level monitoring: Secondary | ICD-10-CM | POA: Diagnosis not present

## 2017-10-05 DIAGNOSIS — N186 End stage renal disease: Secondary | ICD-10-CM | POA: Diagnosis not present

## 2017-10-05 DIAGNOSIS — N2581 Secondary hyperparathyroidism of renal origin: Secondary | ICD-10-CM | POA: Diagnosis not present

## 2017-10-05 DIAGNOSIS — D509 Iron deficiency anemia, unspecified: Secondary | ICD-10-CM | POA: Diagnosis not present

## 2017-10-05 DIAGNOSIS — D688 Other specified coagulation defects: Secondary | ICD-10-CM | POA: Diagnosis not present

## 2017-10-05 DIAGNOSIS — R51 Headache: Secondary | ICD-10-CM | POA: Diagnosis not present

## 2017-10-05 DIAGNOSIS — D631 Anemia in chronic kidney disease: Secondary | ICD-10-CM | POA: Diagnosis not present

## 2017-10-05 DIAGNOSIS — E039 Hypothyroidism, unspecified: Secondary | ICD-10-CM | POA: Diagnosis not present

## 2017-10-07 DIAGNOSIS — Z5181 Encounter for therapeutic drug level monitoring: Secondary | ICD-10-CM | POA: Diagnosis not present

## 2017-10-07 DIAGNOSIS — D509 Iron deficiency anemia, unspecified: Secondary | ICD-10-CM | POA: Diagnosis not present

## 2017-10-07 DIAGNOSIS — R51 Headache: Secondary | ICD-10-CM | POA: Diagnosis not present

## 2017-10-07 DIAGNOSIS — E039 Hypothyroidism, unspecified: Secondary | ICD-10-CM | POA: Diagnosis not present

## 2017-10-07 DIAGNOSIS — N186 End stage renal disease: Secondary | ICD-10-CM | POA: Diagnosis not present

## 2017-10-07 DIAGNOSIS — R52 Pain, unspecified: Secondary | ICD-10-CM | POA: Diagnosis not present

## 2017-10-07 DIAGNOSIS — D688 Other specified coagulation defects: Secondary | ICD-10-CM | POA: Diagnosis not present

## 2017-10-07 DIAGNOSIS — D631 Anemia in chronic kidney disease: Secondary | ICD-10-CM | POA: Diagnosis not present

## 2017-10-07 DIAGNOSIS — N2581 Secondary hyperparathyroidism of renal origin: Secondary | ICD-10-CM | POA: Diagnosis not present

## 2017-10-07 DIAGNOSIS — I482 Chronic atrial fibrillation: Secondary | ICD-10-CM | POA: Diagnosis not present

## 2017-10-10 DIAGNOSIS — R52 Pain, unspecified: Secondary | ICD-10-CM | POA: Diagnosis not present

## 2017-10-10 DIAGNOSIS — E039 Hypothyroidism, unspecified: Secondary | ICD-10-CM | POA: Diagnosis not present

## 2017-10-10 DIAGNOSIS — R51 Headache: Secondary | ICD-10-CM | POA: Diagnosis not present

## 2017-10-10 DIAGNOSIS — D509 Iron deficiency anemia, unspecified: Secondary | ICD-10-CM | POA: Diagnosis not present

## 2017-10-10 DIAGNOSIS — Z5181 Encounter for therapeutic drug level monitoring: Secondary | ICD-10-CM | POA: Diagnosis not present

## 2017-10-10 DIAGNOSIS — I482 Chronic atrial fibrillation: Secondary | ICD-10-CM | POA: Diagnosis not present

## 2017-10-10 DIAGNOSIS — D688 Other specified coagulation defects: Secondary | ICD-10-CM | POA: Diagnosis not present

## 2017-10-10 DIAGNOSIS — N186 End stage renal disease: Secondary | ICD-10-CM | POA: Diagnosis not present

## 2017-10-10 DIAGNOSIS — D631 Anemia in chronic kidney disease: Secondary | ICD-10-CM | POA: Diagnosis not present

## 2017-10-10 DIAGNOSIS — N2581 Secondary hyperparathyroidism of renal origin: Secondary | ICD-10-CM | POA: Diagnosis not present

## 2017-10-12 DIAGNOSIS — I482 Chronic atrial fibrillation: Secondary | ICD-10-CM | POA: Diagnosis not present

## 2017-10-12 DIAGNOSIS — R52 Pain, unspecified: Secondary | ICD-10-CM | POA: Diagnosis not present

## 2017-10-12 DIAGNOSIS — D631 Anemia in chronic kidney disease: Secondary | ICD-10-CM | POA: Diagnosis not present

## 2017-10-12 DIAGNOSIS — D688 Other specified coagulation defects: Secondary | ICD-10-CM | POA: Diagnosis not present

## 2017-10-12 DIAGNOSIS — R51 Headache: Secondary | ICD-10-CM | POA: Diagnosis not present

## 2017-10-12 DIAGNOSIS — E039 Hypothyroidism, unspecified: Secondary | ICD-10-CM | POA: Diagnosis not present

## 2017-10-12 DIAGNOSIS — D509 Iron deficiency anemia, unspecified: Secondary | ICD-10-CM | POA: Diagnosis not present

## 2017-10-12 DIAGNOSIS — Z5181 Encounter for therapeutic drug level monitoring: Secondary | ICD-10-CM | POA: Diagnosis not present

## 2017-10-12 DIAGNOSIS — N2581 Secondary hyperparathyroidism of renal origin: Secondary | ICD-10-CM | POA: Diagnosis not present

## 2017-10-12 DIAGNOSIS — N186 End stage renal disease: Secondary | ICD-10-CM | POA: Diagnosis not present

## 2017-10-14 DIAGNOSIS — Z5181 Encounter for therapeutic drug level monitoring: Secondary | ICD-10-CM | POA: Diagnosis not present

## 2017-10-14 DIAGNOSIS — R52 Pain, unspecified: Secondary | ICD-10-CM | POA: Diagnosis not present

## 2017-10-14 DIAGNOSIS — D688 Other specified coagulation defects: Secondary | ICD-10-CM | POA: Diagnosis not present

## 2017-10-14 DIAGNOSIS — N186 End stage renal disease: Secondary | ICD-10-CM | POA: Diagnosis not present

## 2017-10-14 DIAGNOSIS — R51 Headache: Secondary | ICD-10-CM | POA: Diagnosis not present

## 2017-10-14 DIAGNOSIS — D509 Iron deficiency anemia, unspecified: Secondary | ICD-10-CM | POA: Diagnosis not present

## 2017-10-14 DIAGNOSIS — I482 Chronic atrial fibrillation: Secondary | ICD-10-CM | POA: Diagnosis not present

## 2017-10-14 DIAGNOSIS — E039 Hypothyroidism, unspecified: Secondary | ICD-10-CM | POA: Diagnosis not present

## 2017-10-14 DIAGNOSIS — N2581 Secondary hyperparathyroidism of renal origin: Secondary | ICD-10-CM | POA: Diagnosis not present

## 2017-10-14 DIAGNOSIS — D631 Anemia in chronic kidney disease: Secondary | ICD-10-CM | POA: Diagnosis not present

## 2017-10-16 ENCOUNTER — Ambulatory Visit: Payer: Medicare Other | Admitting: *Deleted

## 2017-10-16 DIAGNOSIS — Z5181 Encounter for therapeutic drug level monitoring: Secondary | ICD-10-CM

## 2017-10-16 DIAGNOSIS — I4891 Unspecified atrial fibrillation: Secondary | ICD-10-CM

## 2017-10-16 DIAGNOSIS — I480 Paroxysmal atrial fibrillation: Secondary | ICD-10-CM | POA: Diagnosis not present

## 2017-10-16 LAB — POCT INR: INR: 2.4

## 2017-10-16 NOTE — Patient Instructions (Signed)
Description   Continue taking 1/2 tablet daily except 1 tablet on Monday, Wednesday, and Friday. Recheck INR in 4 weeks

## 2017-10-17 DIAGNOSIS — N2581 Secondary hyperparathyroidism of renal origin: Secondary | ICD-10-CM | POA: Diagnosis not present

## 2017-10-17 DIAGNOSIS — E039 Hypothyroidism, unspecified: Secondary | ICD-10-CM | POA: Diagnosis not present

## 2017-10-17 DIAGNOSIS — I482 Chronic atrial fibrillation: Secondary | ICD-10-CM | POA: Diagnosis not present

## 2017-10-17 DIAGNOSIS — I129 Hypertensive chronic kidney disease with stage 1 through stage 4 chronic kidney disease, or unspecified chronic kidney disease: Secondary | ICD-10-CM | POA: Diagnosis not present

## 2017-10-17 DIAGNOSIS — R52 Pain, unspecified: Secondary | ICD-10-CM | POA: Diagnosis not present

## 2017-10-17 DIAGNOSIS — D688 Other specified coagulation defects: Secondary | ICD-10-CM | POA: Diagnosis not present

## 2017-10-17 DIAGNOSIS — D509 Iron deficiency anemia, unspecified: Secondary | ICD-10-CM | POA: Diagnosis not present

## 2017-10-17 DIAGNOSIS — R51 Headache: Secondary | ICD-10-CM | POA: Diagnosis not present

## 2017-10-17 DIAGNOSIS — Z992 Dependence on renal dialysis: Secondary | ICD-10-CM | POA: Diagnosis not present

## 2017-10-17 DIAGNOSIS — N186 End stage renal disease: Secondary | ICD-10-CM | POA: Diagnosis not present

## 2017-10-17 DIAGNOSIS — D631 Anemia in chronic kidney disease: Secondary | ICD-10-CM | POA: Diagnosis not present

## 2017-10-17 DIAGNOSIS — Z5181 Encounter for therapeutic drug level monitoring: Secondary | ICD-10-CM | POA: Diagnosis not present

## 2017-10-19 DIAGNOSIS — D509 Iron deficiency anemia, unspecified: Secondary | ICD-10-CM | POA: Diagnosis not present

## 2017-10-19 DIAGNOSIS — E039 Hypothyroidism, unspecified: Secondary | ICD-10-CM | POA: Diagnosis not present

## 2017-10-19 DIAGNOSIS — N2581 Secondary hyperparathyroidism of renal origin: Secondary | ICD-10-CM | POA: Diagnosis not present

## 2017-10-19 DIAGNOSIS — N186 End stage renal disease: Secondary | ICD-10-CM | POA: Diagnosis not present

## 2017-10-19 DIAGNOSIS — Z5181 Encounter for therapeutic drug level monitoring: Secondary | ICD-10-CM | POA: Diagnosis not present

## 2017-10-19 DIAGNOSIS — I482 Chronic atrial fibrillation: Secondary | ICD-10-CM | POA: Diagnosis not present

## 2017-10-19 DIAGNOSIS — D688 Other specified coagulation defects: Secondary | ICD-10-CM | POA: Diagnosis not present

## 2017-10-21 DIAGNOSIS — N186 End stage renal disease: Secondary | ICD-10-CM | POA: Diagnosis not present

## 2017-10-21 DIAGNOSIS — D509 Iron deficiency anemia, unspecified: Secondary | ICD-10-CM | POA: Diagnosis not present

## 2017-10-21 DIAGNOSIS — D688 Other specified coagulation defects: Secondary | ICD-10-CM | POA: Diagnosis not present

## 2017-10-21 DIAGNOSIS — Z5181 Encounter for therapeutic drug level monitoring: Secondary | ICD-10-CM | POA: Diagnosis not present

## 2017-10-21 DIAGNOSIS — E039 Hypothyroidism, unspecified: Secondary | ICD-10-CM | POA: Diagnosis not present

## 2017-10-21 DIAGNOSIS — N2581 Secondary hyperparathyroidism of renal origin: Secondary | ICD-10-CM | POA: Diagnosis not present

## 2017-10-21 DIAGNOSIS — I482 Chronic atrial fibrillation: Secondary | ICD-10-CM | POA: Diagnosis not present

## 2017-10-24 DIAGNOSIS — I482 Chronic atrial fibrillation: Secondary | ICD-10-CM | POA: Diagnosis not present

## 2017-10-24 DIAGNOSIS — D509 Iron deficiency anemia, unspecified: Secondary | ICD-10-CM | POA: Diagnosis not present

## 2017-10-24 DIAGNOSIS — E039 Hypothyroidism, unspecified: Secondary | ICD-10-CM | POA: Diagnosis not present

## 2017-10-24 DIAGNOSIS — D688 Other specified coagulation defects: Secondary | ICD-10-CM | POA: Diagnosis not present

## 2017-10-24 DIAGNOSIS — N2581 Secondary hyperparathyroidism of renal origin: Secondary | ICD-10-CM | POA: Diagnosis not present

## 2017-10-24 DIAGNOSIS — N186 End stage renal disease: Secondary | ICD-10-CM | POA: Diagnosis not present

## 2017-10-24 DIAGNOSIS — Z5181 Encounter for therapeutic drug level monitoring: Secondary | ICD-10-CM | POA: Diagnosis not present

## 2017-10-26 DIAGNOSIS — N186 End stage renal disease: Secondary | ICD-10-CM | POA: Diagnosis not present

## 2017-10-26 DIAGNOSIS — D688 Other specified coagulation defects: Secondary | ICD-10-CM | POA: Diagnosis not present

## 2017-10-26 DIAGNOSIS — I482 Chronic atrial fibrillation: Secondary | ICD-10-CM | POA: Diagnosis not present

## 2017-10-26 DIAGNOSIS — N2581 Secondary hyperparathyroidism of renal origin: Secondary | ICD-10-CM | POA: Diagnosis not present

## 2017-10-26 DIAGNOSIS — Z5181 Encounter for therapeutic drug level monitoring: Secondary | ICD-10-CM | POA: Diagnosis not present

## 2017-10-26 DIAGNOSIS — D509 Iron deficiency anemia, unspecified: Secondary | ICD-10-CM | POA: Diagnosis not present

## 2017-10-26 DIAGNOSIS — E039 Hypothyroidism, unspecified: Secondary | ICD-10-CM | POA: Diagnosis not present

## 2017-10-28 DIAGNOSIS — N2581 Secondary hyperparathyroidism of renal origin: Secondary | ICD-10-CM | POA: Diagnosis not present

## 2017-10-28 DIAGNOSIS — D688 Other specified coagulation defects: Secondary | ICD-10-CM | POA: Diagnosis not present

## 2017-10-28 DIAGNOSIS — Z5181 Encounter for therapeutic drug level monitoring: Secondary | ICD-10-CM | POA: Diagnosis not present

## 2017-10-28 DIAGNOSIS — D509 Iron deficiency anemia, unspecified: Secondary | ICD-10-CM | POA: Diagnosis not present

## 2017-10-28 DIAGNOSIS — I482 Chronic atrial fibrillation: Secondary | ICD-10-CM | POA: Diagnosis not present

## 2017-10-28 DIAGNOSIS — E039 Hypothyroidism, unspecified: Secondary | ICD-10-CM | POA: Diagnosis not present

## 2017-10-28 DIAGNOSIS — N186 End stage renal disease: Secondary | ICD-10-CM | POA: Diagnosis not present

## 2017-10-31 DIAGNOSIS — D509 Iron deficiency anemia, unspecified: Secondary | ICD-10-CM | POA: Diagnosis not present

## 2017-10-31 DIAGNOSIS — E039 Hypothyroidism, unspecified: Secondary | ICD-10-CM | POA: Diagnosis not present

## 2017-10-31 DIAGNOSIS — Z5181 Encounter for therapeutic drug level monitoring: Secondary | ICD-10-CM | POA: Diagnosis not present

## 2017-10-31 DIAGNOSIS — I482 Chronic atrial fibrillation: Secondary | ICD-10-CM | POA: Diagnosis not present

## 2017-10-31 DIAGNOSIS — N2581 Secondary hyperparathyroidism of renal origin: Secondary | ICD-10-CM | POA: Diagnosis not present

## 2017-10-31 DIAGNOSIS — N186 End stage renal disease: Secondary | ICD-10-CM | POA: Diagnosis not present

## 2017-10-31 DIAGNOSIS — D688 Other specified coagulation defects: Secondary | ICD-10-CM | POA: Diagnosis not present

## 2017-11-02 DIAGNOSIS — N186 End stage renal disease: Secondary | ICD-10-CM | POA: Diagnosis not present

## 2017-11-02 DIAGNOSIS — I482 Chronic atrial fibrillation: Secondary | ICD-10-CM | POA: Diagnosis not present

## 2017-11-02 DIAGNOSIS — N2581 Secondary hyperparathyroidism of renal origin: Secondary | ICD-10-CM | POA: Diagnosis not present

## 2017-11-02 DIAGNOSIS — Z5181 Encounter for therapeutic drug level monitoring: Secondary | ICD-10-CM | POA: Diagnosis not present

## 2017-11-02 DIAGNOSIS — D688 Other specified coagulation defects: Secondary | ICD-10-CM | POA: Diagnosis not present

## 2017-11-02 DIAGNOSIS — D509 Iron deficiency anemia, unspecified: Secondary | ICD-10-CM | POA: Diagnosis not present

## 2017-11-02 DIAGNOSIS — E039 Hypothyroidism, unspecified: Secondary | ICD-10-CM | POA: Diagnosis not present

## 2017-11-03 ENCOUNTER — Ambulatory Visit: Payer: Medicare Other | Admitting: Nurse Practitioner

## 2017-11-04 DIAGNOSIS — N186 End stage renal disease: Secondary | ICD-10-CM | POA: Diagnosis not present

## 2017-11-04 DIAGNOSIS — N2581 Secondary hyperparathyroidism of renal origin: Secondary | ICD-10-CM | POA: Diagnosis not present

## 2017-11-04 DIAGNOSIS — Z5181 Encounter for therapeutic drug level monitoring: Secondary | ICD-10-CM | POA: Diagnosis not present

## 2017-11-04 DIAGNOSIS — I482 Chronic atrial fibrillation: Secondary | ICD-10-CM | POA: Diagnosis not present

## 2017-11-04 DIAGNOSIS — E039 Hypothyroidism, unspecified: Secondary | ICD-10-CM | POA: Diagnosis not present

## 2017-11-04 DIAGNOSIS — D688 Other specified coagulation defects: Secondary | ICD-10-CM | POA: Diagnosis not present

## 2017-11-04 DIAGNOSIS — D509 Iron deficiency anemia, unspecified: Secondary | ICD-10-CM | POA: Diagnosis not present

## 2017-11-07 DIAGNOSIS — E039 Hypothyroidism, unspecified: Secondary | ICD-10-CM | POA: Diagnosis not present

## 2017-11-07 DIAGNOSIS — N186 End stage renal disease: Secondary | ICD-10-CM | POA: Diagnosis not present

## 2017-11-07 DIAGNOSIS — D688 Other specified coagulation defects: Secondary | ICD-10-CM | POA: Diagnosis not present

## 2017-11-07 DIAGNOSIS — Z5181 Encounter for therapeutic drug level monitoring: Secondary | ICD-10-CM | POA: Diagnosis not present

## 2017-11-07 DIAGNOSIS — N2581 Secondary hyperparathyroidism of renal origin: Secondary | ICD-10-CM | POA: Diagnosis not present

## 2017-11-07 DIAGNOSIS — D509 Iron deficiency anemia, unspecified: Secondary | ICD-10-CM | POA: Diagnosis not present

## 2017-11-07 DIAGNOSIS — I482 Chronic atrial fibrillation: Secondary | ICD-10-CM | POA: Diagnosis not present

## 2017-11-09 DIAGNOSIS — I482 Chronic atrial fibrillation: Secondary | ICD-10-CM | POA: Diagnosis not present

## 2017-11-09 DIAGNOSIS — D509 Iron deficiency anemia, unspecified: Secondary | ICD-10-CM | POA: Diagnosis not present

## 2017-11-09 DIAGNOSIS — Z5181 Encounter for therapeutic drug level monitoring: Secondary | ICD-10-CM | POA: Diagnosis not present

## 2017-11-09 DIAGNOSIS — N186 End stage renal disease: Secondary | ICD-10-CM | POA: Diagnosis not present

## 2017-11-09 DIAGNOSIS — E039 Hypothyroidism, unspecified: Secondary | ICD-10-CM | POA: Diagnosis not present

## 2017-11-09 DIAGNOSIS — D688 Other specified coagulation defects: Secondary | ICD-10-CM | POA: Diagnosis not present

## 2017-11-09 DIAGNOSIS — N2581 Secondary hyperparathyroidism of renal origin: Secondary | ICD-10-CM | POA: Diagnosis not present

## 2017-11-11 DIAGNOSIS — N2581 Secondary hyperparathyroidism of renal origin: Secondary | ICD-10-CM | POA: Diagnosis not present

## 2017-11-11 DIAGNOSIS — N186 End stage renal disease: Secondary | ICD-10-CM | POA: Diagnosis not present

## 2017-11-11 DIAGNOSIS — I482 Chronic atrial fibrillation: Secondary | ICD-10-CM | POA: Diagnosis not present

## 2017-11-11 DIAGNOSIS — Z5181 Encounter for therapeutic drug level monitoring: Secondary | ICD-10-CM | POA: Diagnosis not present

## 2017-11-11 DIAGNOSIS — D688 Other specified coagulation defects: Secondary | ICD-10-CM | POA: Diagnosis not present

## 2017-11-11 DIAGNOSIS — E039 Hypothyroidism, unspecified: Secondary | ICD-10-CM | POA: Diagnosis not present

## 2017-11-11 DIAGNOSIS — D509 Iron deficiency anemia, unspecified: Secondary | ICD-10-CM | POA: Diagnosis not present

## 2017-11-14 DIAGNOSIS — E039 Hypothyroidism, unspecified: Secondary | ICD-10-CM | POA: Diagnosis not present

## 2017-11-14 DIAGNOSIS — Z5181 Encounter for therapeutic drug level monitoring: Secondary | ICD-10-CM | POA: Diagnosis not present

## 2017-11-14 DIAGNOSIS — N186 End stage renal disease: Secondary | ICD-10-CM | POA: Diagnosis not present

## 2017-11-14 DIAGNOSIS — D688 Other specified coagulation defects: Secondary | ICD-10-CM | POA: Diagnosis not present

## 2017-11-14 DIAGNOSIS — N2581 Secondary hyperparathyroidism of renal origin: Secondary | ICD-10-CM | POA: Diagnosis not present

## 2017-11-14 DIAGNOSIS — D509 Iron deficiency anemia, unspecified: Secondary | ICD-10-CM | POA: Diagnosis not present

## 2017-11-14 DIAGNOSIS — I482 Chronic atrial fibrillation: Secondary | ICD-10-CM | POA: Diagnosis not present

## 2017-11-15 ENCOUNTER — Ambulatory Visit: Payer: Medicare Other | Admitting: *Deleted

## 2017-11-15 DIAGNOSIS — I4891 Unspecified atrial fibrillation: Secondary | ICD-10-CM

## 2017-11-15 DIAGNOSIS — I480 Paroxysmal atrial fibrillation: Secondary | ICD-10-CM

## 2017-11-15 DIAGNOSIS — Z5181 Encounter for therapeutic drug level monitoring: Secondary | ICD-10-CM | POA: Diagnosis not present

## 2017-11-15 LAB — POCT INR: INR: 1.9 — AB (ref 2.0–3.0)

## 2017-11-15 NOTE — Patient Instructions (Signed)
Description   Today May 29th take 1 and 1/2 tablets then continue taking 1/2 tablet daily except 1 tablet on Monday, Wednesday, and Friday. Recheck INR in 2 weeks

## 2017-11-16 DIAGNOSIS — D688 Other specified coagulation defects: Secondary | ICD-10-CM | POA: Diagnosis not present

## 2017-11-16 DIAGNOSIS — N2581 Secondary hyperparathyroidism of renal origin: Secondary | ICD-10-CM | POA: Diagnosis not present

## 2017-11-16 DIAGNOSIS — D509 Iron deficiency anemia, unspecified: Secondary | ICD-10-CM | POA: Diagnosis not present

## 2017-11-16 DIAGNOSIS — I482 Chronic atrial fibrillation: Secondary | ICD-10-CM | POA: Diagnosis not present

## 2017-11-16 DIAGNOSIS — E039 Hypothyroidism, unspecified: Secondary | ICD-10-CM | POA: Diagnosis not present

## 2017-11-16 DIAGNOSIS — Z5181 Encounter for therapeutic drug level monitoring: Secondary | ICD-10-CM | POA: Diagnosis not present

## 2017-11-16 DIAGNOSIS — N186 End stage renal disease: Secondary | ICD-10-CM | POA: Diagnosis not present

## 2017-11-17 DIAGNOSIS — I129 Hypertensive chronic kidney disease with stage 1 through stage 4 chronic kidney disease, or unspecified chronic kidney disease: Secondary | ICD-10-CM | POA: Diagnosis not present

## 2017-11-17 DIAGNOSIS — Z992 Dependence on renal dialysis: Secondary | ICD-10-CM | POA: Diagnosis not present

## 2017-11-17 DIAGNOSIS — N186 End stage renal disease: Secondary | ICD-10-CM | POA: Diagnosis not present

## 2017-11-18 DIAGNOSIS — R197 Diarrhea, unspecified: Secondary | ICD-10-CM | POA: Diagnosis not present

## 2017-11-18 DIAGNOSIS — N186 End stage renal disease: Secondary | ICD-10-CM | POA: Diagnosis not present

## 2017-11-18 DIAGNOSIS — E039 Hypothyroidism, unspecified: Secondary | ICD-10-CM | POA: Diagnosis not present

## 2017-11-18 DIAGNOSIS — D509 Iron deficiency anemia, unspecified: Secondary | ICD-10-CM | POA: Diagnosis not present

## 2017-11-18 DIAGNOSIS — D688 Other specified coagulation defects: Secondary | ICD-10-CM | POA: Diagnosis not present

## 2017-11-18 DIAGNOSIS — Z5181 Encounter for therapeutic drug level monitoring: Secondary | ICD-10-CM | POA: Diagnosis not present

## 2017-11-18 DIAGNOSIS — R51 Headache: Secondary | ICD-10-CM | POA: Diagnosis not present

## 2017-11-18 DIAGNOSIS — D631 Anemia in chronic kidney disease: Secondary | ICD-10-CM | POA: Diagnosis not present

## 2017-11-18 DIAGNOSIS — I482 Chronic atrial fibrillation: Secondary | ICD-10-CM | POA: Diagnosis not present

## 2017-11-18 DIAGNOSIS — N2581 Secondary hyperparathyroidism of renal origin: Secondary | ICD-10-CM | POA: Diagnosis not present

## 2017-11-21 DIAGNOSIS — N186 End stage renal disease: Secondary | ICD-10-CM | POA: Diagnosis not present

## 2017-11-21 DIAGNOSIS — D688 Other specified coagulation defects: Secondary | ICD-10-CM | POA: Diagnosis not present

## 2017-11-21 DIAGNOSIS — N2581 Secondary hyperparathyroidism of renal origin: Secondary | ICD-10-CM | POA: Diagnosis not present

## 2017-11-21 DIAGNOSIS — D509 Iron deficiency anemia, unspecified: Secondary | ICD-10-CM | POA: Diagnosis not present

## 2017-11-21 DIAGNOSIS — R51 Headache: Secondary | ICD-10-CM | POA: Diagnosis not present

## 2017-11-21 DIAGNOSIS — E039 Hypothyroidism, unspecified: Secondary | ICD-10-CM | POA: Diagnosis not present

## 2017-11-21 DIAGNOSIS — D631 Anemia in chronic kidney disease: Secondary | ICD-10-CM | POA: Diagnosis not present

## 2017-11-21 DIAGNOSIS — I482 Chronic atrial fibrillation: Secondary | ICD-10-CM | POA: Diagnosis not present

## 2017-11-21 DIAGNOSIS — R197 Diarrhea, unspecified: Secondary | ICD-10-CM | POA: Diagnosis not present

## 2017-11-21 DIAGNOSIS — Z5181 Encounter for therapeutic drug level monitoring: Secondary | ICD-10-CM | POA: Diagnosis not present

## 2017-11-23 DIAGNOSIS — N2581 Secondary hyperparathyroidism of renal origin: Secondary | ICD-10-CM | POA: Diagnosis not present

## 2017-11-23 DIAGNOSIS — I482 Chronic atrial fibrillation: Secondary | ICD-10-CM | POA: Diagnosis not present

## 2017-11-23 DIAGNOSIS — D631 Anemia in chronic kidney disease: Secondary | ICD-10-CM | POA: Diagnosis not present

## 2017-11-23 DIAGNOSIS — Z5181 Encounter for therapeutic drug level monitoring: Secondary | ICD-10-CM | POA: Diagnosis not present

## 2017-11-23 DIAGNOSIS — E039 Hypothyroidism, unspecified: Secondary | ICD-10-CM | POA: Diagnosis not present

## 2017-11-23 DIAGNOSIS — R197 Diarrhea, unspecified: Secondary | ICD-10-CM | POA: Diagnosis not present

## 2017-11-23 DIAGNOSIS — D509 Iron deficiency anemia, unspecified: Secondary | ICD-10-CM | POA: Diagnosis not present

## 2017-11-23 DIAGNOSIS — N186 End stage renal disease: Secondary | ICD-10-CM | POA: Diagnosis not present

## 2017-11-23 DIAGNOSIS — R51 Headache: Secondary | ICD-10-CM | POA: Diagnosis not present

## 2017-11-23 DIAGNOSIS — D688 Other specified coagulation defects: Secondary | ICD-10-CM | POA: Diagnosis not present

## 2017-11-24 ENCOUNTER — Observation Stay (HOSPITAL_COMMUNITY)
Admission: EM | Admit: 2017-11-24 | Discharge: 2017-11-27 | Disposition: A | Discharge status: Home / Self Care | Payer: Medicare Other | Attending: Family Medicine | Admitting: Family Medicine

## 2017-11-24 ENCOUNTER — Emergency Department (HOSPITAL_COMMUNITY): Payer: Medicare Other

## 2017-11-24 ENCOUNTER — Encounter (HOSPITAL_COMMUNITY): Payer: Self-pay | Admitting: Emergency Medicine

## 2017-11-24 DIAGNOSIS — I480 Paroxysmal atrial fibrillation: Secondary | ICD-10-CM | POA: Diagnosis not present

## 2017-11-24 DIAGNOSIS — I429 Cardiomyopathy, unspecified: Secondary | ICD-10-CM | POA: Diagnosis not present

## 2017-11-24 DIAGNOSIS — Z7901 Long term (current) use of anticoagulants: Secondary | ICD-10-CM | POA: Diagnosis not present

## 2017-11-24 DIAGNOSIS — Z992 Dependence on renal dialysis: Secondary | ICD-10-CM | POA: Insufficient documentation

## 2017-11-24 DIAGNOSIS — I5032 Chronic diastolic (congestive) heart failure: Secondary | ICD-10-CM | POA: Diagnosis not present

## 2017-11-24 DIAGNOSIS — Z8249 Family history of ischemic heart disease and other diseases of the circulatory system: Secondary | ICD-10-CM | POA: Diagnosis not present

## 2017-11-24 DIAGNOSIS — H409 Unspecified glaucoma: Secondary | ICD-10-CM | POA: Insufficient documentation

## 2017-11-24 DIAGNOSIS — K449 Diaphragmatic hernia without obstruction or gangrene: Secondary | ICD-10-CM | POA: Insufficient documentation

## 2017-11-24 DIAGNOSIS — D631 Anemia in chronic kidney disease: Secondary | ICD-10-CM | POA: Diagnosis not present

## 2017-11-24 DIAGNOSIS — G43909 Migraine, unspecified, not intractable, without status migrainosus: Secondary | ICD-10-CM | POA: Diagnosis not present

## 2017-11-24 DIAGNOSIS — I132 Hypertensive heart and chronic kidney disease with heart failure and with stage 5 chronic kidney disease, or end stage renal disease: Secondary | ICD-10-CM | POA: Diagnosis not present

## 2017-11-24 DIAGNOSIS — Z87440 Personal history of urinary (tract) infections: Secondary | ICD-10-CM | POA: Insufficient documentation

## 2017-11-24 DIAGNOSIS — E785 Hyperlipidemia, unspecified: Secondary | ICD-10-CM | POA: Insufficient documentation

## 2017-11-24 DIAGNOSIS — N12 Tubulo-interstitial nephritis, not specified as acute or chronic: Secondary | ICD-10-CM

## 2017-11-24 DIAGNOSIS — N261 Atrophy of kidney (terminal): Secondary | ICD-10-CM | POA: Diagnosis not present

## 2017-11-24 DIAGNOSIS — K429 Umbilical hernia without obstruction or gangrene: Secondary | ICD-10-CM | POA: Diagnosis not present

## 2017-11-24 DIAGNOSIS — R109 Unspecified abdominal pain: Secondary | ICD-10-CM | POA: Diagnosis not present

## 2017-11-24 DIAGNOSIS — R319 Hematuria, unspecified: Secondary | ICD-10-CM | POA: Diagnosis not present

## 2017-11-24 DIAGNOSIS — N186 End stage renal disease: Secondary | ICD-10-CM | POA: Diagnosis not present

## 2017-11-24 DIAGNOSIS — N136 Pyonephrosis: Secondary | ICD-10-CM | POA: Diagnosis not present

## 2017-11-24 DIAGNOSIS — Z885 Allergy status to narcotic agent status: Secondary | ICD-10-CM | POA: Insufficient documentation

## 2017-11-24 DIAGNOSIS — N2581 Secondary hyperparathyroidism of renal origin: Secondary | ICD-10-CM | POA: Insufficient documentation

## 2017-11-24 DIAGNOSIS — K219 Gastro-esophageal reflux disease without esophagitis: Secondary | ICD-10-CM | POA: Diagnosis not present

## 2017-11-24 DIAGNOSIS — K439 Ventral hernia without obstruction or gangrene: Secondary | ICD-10-CM | POA: Insufficient documentation

## 2017-11-24 DIAGNOSIS — N132 Hydronephrosis with renal and ureteral calculous obstruction: Secondary | ICD-10-CM | POA: Diagnosis not present

## 2017-11-24 DIAGNOSIS — R5383 Other fatigue: Secondary | ICD-10-CM | POA: Diagnosis not present

## 2017-11-24 LAB — BASIC METABOLIC PANEL
Anion gap: 12 (ref 5–15)
BUN: 30 mg/dL — ABNORMAL HIGH (ref 6–20)
CO2: 27 mmol/L (ref 22–32)
Calcium: 8.7 mg/dL — ABNORMAL LOW (ref 8.9–10.3)
Chloride: 98 mmol/L — ABNORMAL LOW (ref 101–111)
Creatinine, Ser: 5.73 mg/dL — ABNORMAL HIGH (ref 0.44–1.00)
GFR calc Af Amer: 8 mL/min — ABNORMAL LOW (ref >60)
GFR calc non Af Amer: 7 mL/min — ABNORMAL LOW (ref >60)
Glucose, Bld: 118 mg/dL — ABNORMAL HIGH (ref 65–99)
Potassium: 4 mmol/L (ref 3.5–5.1)
Sodium: 137 mmol/L (ref 135–145)

## 2017-11-24 LAB — CBC WITH DIFFERENTIAL/PLATELET
Abs Immature Granulocytes: 0.1 10*3/uL (ref 0.0–0.1)
Basophils Absolute: 0 10*3/uL (ref 0.0–0.1)
Basophils Relative: 0 %
Eosinophils Absolute: 0.1 10*3/uL (ref 0.0–0.7)
Eosinophils Relative: 1 %
HCT: 35.2 % — ABNORMAL LOW (ref 36.0–46.0)
Hemoglobin: 11.3 g/dL — ABNORMAL LOW (ref 12.0–15.0)
Immature Granulocytes: 1 %
Lymphocytes Relative: 3 %
Lymphs Abs: 0.4 10*3/uL — ABNORMAL LOW (ref 0.7–4.0)
MCH: 30.4 pg (ref 26.0–34.0)
MCHC: 32.1 g/dL (ref 30.0–36.0)
MCV: 94.6 fL (ref 78.0–100.0)
Monocytes Absolute: 0.8 10*3/uL (ref 0.1–1.0)
Monocytes Relative: 5 %
Neutro Abs: 14.7 10*3/uL — ABNORMAL HIGH (ref 1.7–7.7)
Neutrophils Relative %: 90 %
Platelets: 208 10*3/uL (ref 150–400)
RBC: 3.72 MIL/uL — ABNORMAL LOW (ref 3.87–5.11)
RDW: 14.8 % (ref 11.5–15.5)
WBC: 16.1 10*3/uL — ABNORMAL HIGH (ref 4.0–10.5)

## 2017-11-24 MED ORDER — ONDANSETRON HCL 4 MG/2ML IJ SOLN
4.0000 mg | Freq: Once | INTRAMUSCULAR | Status: AC
  Administered 2017-11-24: 4 mg via INTRAVENOUS
  Filled 2017-11-24: qty 2

## 2017-11-24 MED ORDER — MORPHINE SULFATE (PF) 4 MG/ML IV SOLN
4.0000 mg | Freq: Once | INTRAVENOUS | Status: AC
  Administered 2017-11-24: 4 mg via INTRAVENOUS
  Filled 2017-11-24: qty 1

## 2017-11-24 MED ORDER — MORPHINE SULFATE (PF) 4 MG/ML IV SOLN: 4.0000 mg | Freq: Once | INTRAVENOUS | Status: DC

## 2017-11-24 NOTE — ED Triage Notes (Signed)
Patient reports hematuria with right lower back pain onset this afternoon , pt.stated pain is similar to her kidney stone in the past , denies hematuria or fever . HD q Tue/Thur/Sat.

## 2017-11-25 ENCOUNTER — Encounter (HOSPITAL_COMMUNITY): Payer: Self-pay

## 2017-11-25 ENCOUNTER — Other Ambulatory Visit: Payer: Self-pay

## 2017-11-25 DIAGNOSIS — D72829 Elevated white blood cell count, unspecified: Secondary | ICD-10-CM | POA: Diagnosis not present

## 2017-11-25 DIAGNOSIS — N1330 Unspecified hydronephrosis: Secondary | ICD-10-CM | POA: Diagnosis not present

## 2017-11-25 DIAGNOSIS — N200 Calculus of kidney: Secondary | ICD-10-CM

## 2017-11-25 DIAGNOSIS — I480 Paroxysmal atrial fibrillation: Secondary | ICD-10-CM

## 2017-11-25 DIAGNOSIS — N186 End stage renal disease: Secondary | ICD-10-CM | POA: Diagnosis not present

## 2017-11-25 DIAGNOSIS — N10 Acute tubulo-interstitial nephritis: Secondary | ICD-10-CM | POA: Diagnosis not present

## 2017-11-25 DIAGNOSIS — Z7901 Long term (current) use of anticoagulants: Secondary | ICD-10-CM

## 2017-11-25 DIAGNOSIS — N12 Tubulo-interstitial nephritis, not specified as acute or chronic: Secondary | ICD-10-CM | POA: Diagnosis not present

## 2017-11-25 DIAGNOSIS — N132 Hydronephrosis with renal and ureteral calculous obstruction: Secondary | ICD-10-CM | POA: Diagnosis not present

## 2017-11-25 DIAGNOSIS — R310 Gross hematuria: Secondary | ICD-10-CM | POA: Diagnosis not present

## 2017-11-25 LAB — I-STAT CG4 LACTIC ACID, ED
Lactic Acid, Venous: 1.56 mmol/L (ref 0.5–1.9)
Lactic Acid, Venous: 1.28 mmol/L (ref 0.5–1.9)

## 2017-11-25 LAB — URINALYSIS, MICROSCOPIC (REFLEX)
RBC / HPF: >50 RBC/hpf (ref 0–5)
WBC, UA: >50 WBC/hpf (ref 0–5)

## 2017-11-25 LAB — PROTIME-INR
INR: 2.46
Prothrombin Time: 26.5 seconds — ABNORMAL HIGH (ref 11.4–15.2)

## 2017-11-25 LAB — URINALYSIS, ROUTINE W REFLEX MICROSCOPIC
Glucose, UA: 250 mg/dL — AB
Ketones, ur: 15 mg/dL — AB
Nitrite: POSITIVE — AB
Protein, ur: >300 mg/dL — AB
Specific Gravity, Urine: 1.015 (ref 1.005–1.030)
pH: 8 (ref 5.0–8.0)

## 2017-11-25 LAB — MRSA PCR SCREENING: MRSA by PCR: POSITIVE — AB

## 2017-11-25 MED ORDER — ACETAMINOPHEN 500 MG PO TABS
1000.0000 mg | ORAL_TABLET | Freq: Four times a day (QID) | ORAL | Status: DC | PRN
  Filled 2017-11-25: qty 2

## 2017-11-25 MED ORDER — HYDROMORPHONE HCL 1 MG/ML IJ SOLN
0.5000 mg | INTRAMUSCULAR | Status: AC | PRN
  Administered 2017-11-25 – 2017-11-26 (×3): 0.5 mg via INTRAVENOUS
  Filled 2017-11-25 (×3): qty 0.5

## 2017-11-25 MED ORDER — CHLORHEXIDINE GLUCONATE CLOTH 2 % EX PADS: 6.0000 | MEDICATED_PAD | Freq: Every day | CUTANEOUS | Status: DC

## 2017-11-25 MED ORDER — HYDROMORPHONE HCL 2 MG/ML IJ SOLN
0.5000 mg | Freq: Once | INTRAMUSCULAR | Status: AC
  Administered 2017-11-25: 0.5 mg via INTRAVENOUS
  Filled 2017-11-25: qty 1

## 2017-11-25 MED ORDER — CALCITRIOL 0.25 MCG PO CAPS: 0.2500 ug | ORAL_CAPSULE | ORAL | Status: DC

## 2017-11-25 MED ORDER — ONDANSETRON HCL 4 MG/2ML IJ SOLN
4.0000 mg | Freq: Once | INTRAMUSCULAR | Status: AC
  Administered 2017-11-25: 4 mg via INTRAVENOUS
  Filled 2017-11-25: qty 2

## 2017-11-25 MED ORDER — ONDANSETRON HCL 4 MG/2ML IJ SOLN: 4.0000 mg | Freq: Four times a day (QID) | INTRAMUSCULAR | Status: DC | PRN

## 2017-11-25 MED ORDER — SODIUM CHLORIDE 0.9 % IV SOLN
2.0000 g | Freq: Once | INTRAVENOUS | Status: AC
  Administered 2017-11-25: 2 g via INTRAVENOUS
  Filled 2017-11-25: qty 20

## 2017-11-25 MED ORDER — SACCHAROMYCES BOULARDII 250 MG PO CAPS
250.0000 mg | ORAL_CAPSULE | Freq: Two times a day (BID) | ORAL | Status: DC
  Administered 2017-11-25 – 2017-11-27 (×4): 250 mg via ORAL
  Filled 2017-11-25 (×5): qty 1

## 2017-11-25 MED ORDER — AMIODARONE HCL 200 MG PO TABS
200.0000 mg | ORAL_TABLET | Freq: Every day | ORAL | Status: DC
  Administered 2017-11-25 – 2017-11-27 (×3): 200 mg via ORAL
  Filled 2017-11-25 (×3): qty 1

## 2017-11-25 MED ORDER — WARFARIN - PHARMACIST DOSING INPATIENT: Freq: Every day | Status: DC

## 2017-11-25 MED ORDER — SODIUM CHLORIDE 0.9 % IV BOLUS
250.0000 mL | Freq: Once | INTRAVENOUS | Status: AC
  Administered 2017-11-25: 250 mL via INTRAVENOUS

## 2017-11-25 MED ORDER — TIMOLOL MALEATE 0.5 % OP SOLN
1.0000 [drp] | Freq: Every morning | OPHTHALMIC | Status: DC
  Administered 2017-11-25 – 2017-11-27 (×3): 1 [drp] via OPHTHALMIC
  Filled 2017-11-25: qty 5

## 2017-11-25 MED ORDER — MUPIROCIN 2 % EX OINT
1.0000 "application " | TOPICAL_OINTMENT | Freq: Two times a day (BID) | CUTANEOUS | Status: DC
  Administered 2017-11-25 – 2017-11-27 (×4): 1 via NASAL
  Filled 2017-11-25 (×2): qty 22

## 2017-11-25 MED ORDER — WARFARIN SODIUM 2.5 MG PO TABS
2.5000 mg | ORAL_TABLET | Freq: Once | ORAL | Status: AC
  Administered 2017-11-25: 2.5 mg via ORAL
  Filled 2017-11-25: qty 1

## 2017-11-25 MED ORDER — CALCIUM ACETATE (PHOS BINDER) 667 MG PO CAPS
1334.0000 mg | ORAL_CAPSULE | Freq: Three times a day (TID) | ORAL | Status: DC
  Administered 2017-11-25 – 2017-11-27 (×6): 1334 mg via ORAL
  Filled 2017-11-25 (×7): qty 2

## 2017-11-25 MED ORDER — LATANOPROST 0.005 % OP SOLN
1.0000 [drp] | Freq: Every day | OPHTHALMIC | Status: DC
  Administered 2017-11-25 – 2017-11-26 (×2): 1 [drp] via OPHTHALMIC
  Filled 2017-11-25: qty 2.5

## 2017-11-25 MED ORDER — NEPRO/CARBSTEADY PO LIQD
237.0000 mL | Freq: Two times a day (BID) | ORAL | Status: DC
  Administered 2017-11-25 – 2017-11-27 (×4): 237 mL via ORAL
  Filled 2017-11-25 (×8): qty 237

## 2017-11-25 MED ORDER — ADULT MULTIVITAMIN W/MINERALS CH
1.0000 | ORAL_TABLET | Freq: Every day | ORAL | Status: DC
  Administered 2017-11-25 – 2017-11-27 (×3): 1 via ORAL
  Filled 2017-11-25 (×3): qty 1

## 2017-11-25 MED ORDER — SODIUM CHLORIDE 0.9 % IV SOLN
2.0000 g | INTRAVENOUS | Status: DC
  Administered 2017-11-26 – 2017-11-27 (×2): 2 g via INTRAVENOUS
  Filled 2017-11-25 (×3): qty 20

## 2017-11-25 MED ORDER — SALINE SPRAY 0.65 % NA SOLN: 1.0000 | Freq: Three times a day (TID) | NASAL | Status: DC | PRN

## 2017-11-25 NOTE — Progress Notes (Signed)
ANTICOAGULATION CONSULT NOTE - Initial Consult  Pharmacy Consult for warfarin  Indication: atrial fibrillation  Allergies  Allergen Reactions  . Vicodin [Hydrocodone-Acetaminophen] Nausea And Vomiting  . Chlorhexidine Rash    Sunburn    rash  . Percocet [Oxycodone-Acetaminophen] Nausea And Vomiting    Patient Measurements: Height: 5\' 3"  (160 cm) Weight: 197 lb 1.6 oz (89.4 kg) IBW/kg (Calculated) : 52.4   Vital Signs: Temp: 97.6 F (36.4 C) (06/08 0827) Temp Source: Oral (06/08 0827) BP: 154/61 (06/08 0827) Pulse Rate: 74 (06/08 0827)  Labs: Recent Labs    11/24/17 2108 11/25/17 0233  HGB 11.3*  --   HCT 35.2*  --   PLT 208  --   LABPROT  --  26.5*  INR  --  2.46  CREATININE 5.73*  --     Estimated Creatinine Clearance: 9.3 mL/min (A) (by C-G formula based on SCr of 5.73 mg/dL (H)).   Medical History:  Medications:  Scheduled:  . amiodarone  200 mg Oral Daily  . calcium acetate  1,334 mg Oral TID WC  . feeding supplement (NEPRO CARB STEADY)  237 mL Oral BID BM  . latanoprost  1 drop Both Eyes QHS  . multivitamin with minerals  1 tablet Oral Daily  . saccharomyces boulardii  250 mg Oral BID  . timolol  1 drop Both Eyes q morning - 10a    Assessment: Pharmacy consulted to resume PTA warfarin for Afib. Patient reported last dose taken on 6/7 evening. Baseline INR this admission is 2.46. Noted that patient here with UTI with hematuria. Will monitor closely for s/sx of bleeding while on warfarin here. Hemoglobin slight low at 11.3, platelets wnl.   Goal of Therapy:  INR 2-3 Monitor platelets by anticoagulation protocol: Yes   Plan:  Warfarin 2.5mg  PO x 1 tonight (per PTA dosing) INR/CBC daily  Monitor for s/sx of bleeding   Jalene Mullet, Pharm.D. PGY1 Pharmacy Resident 11/25/2017 9:18 AM Main Pharmacy: [phone]

## 2017-11-25 NOTE — Consult Note (Addendum)
[facility] Renal Consultation Note    Indication for Consultation:  Management of ESRD/hemodialysis; anemia, hypertension/volume and secondary hyperparathyroidism PCP:  HPI: [name] is a 72 y.o. female with ESRD on HD TTS at [facility]. PMH also significant for chronic CM, chronic CHF EF 55-60% in 02/2017,  Afib on chronic Coumadin, recurrent UTIs, Hx nephrolithiasis.    She is admitted with R pyelonephritis. She presented to ED with hematuria, abd pain and chills. CT renal study showing mild right-sided hydronephrosis with moderate right perinephric inflammation/fluid. Labs K 4.0, Lactic acid 1.28, WBC 16.1, Hgb 11.3. Blood/urine cultures collected. Rocephin started and urology consult pending. Seen in room. She feels better than she did last night. Rushie Chestnut thinks she may have passed a stone, but not sure. Denies fevers, CP,SOB, N,V.   Dialyzing on TTS schedule. No reported issues with dialysis recently.      Past Medical History:  Diagnosis Date  . Cardiomyopathy ([place])    a. h/o LV dysfunction EF 20-25% in 2013 due to sepsis.>> improved to normal   . Chronic diastolic CHF (congestive heart failure) ([place])    10/ 2013 in setting of septic shock  . Complication of anesthesia    use a little anesthesia , per patient MD states she quit breathing (2016); hard to wake up  . ESRD (end stage renal disease) ([place])    dialysis Tues, Thurs, Sat [address], sees dr deterding   . GERD (gastroesophageal reflux disease)   . Glaucoma    both eyes  . H/O hiatal hernia    a. CT 2017: large gastric hiatal hernia.  Marland Kitchen Headache(784.0)    migraine hx of  . History of blood transfusion 04/13/2015   . History of echocardiogram    a. Echo 6/17: EF 60-65%, normal wall motion, mild MR, atrial septal lipomatous hypertrophy, PASP 34 mmHg, possible trivial free-flowing pericardial effusion along RV free wall // b. Echo 5/17: Mild LVH, EF 55-60%, normal wall motion, grade 1  diastolic dysfunction, trivial MR, severe LAE, mild RAE, PASP 42 mmHg  . History of nephrostomy 04/11/2015   currently inplace 04/28/2015  removed now  . History of nuclear stress test    a. Myoview 1/14 - Marked ischemia in the basal anterior, mid anterior, apical septal and apical inferior regions, EF 63% >> LHC normal   . Hyperlipidemia   . Hypertension    medication removed from regimen due to low blood pressure   . Iron deficiency    hx  . Nephrolithiasis 2002, 2006   bilateral  . Normal coronary arteries 2014   a. LHC in 1/14: normal coronary arteries  . PAF (paroxysmal atrial fibrillation) ([place])    a. 10/ 2013  in setting of Septic Shock //  b. recurrent during admit for pneumonia, L effusion >> placed on Amiodarone // Coumadin for anticoagulation  . Pneumonia jan 2018, last tme lungs clear now   dx 10-06-2014 per CXR--  on 10-27-2014 pt states finished antibiotic and denies cough or fever  . S/P hemodialysis catheter insertion ([place]) 04/11/2015    right anterior chest , only used once   . Sigmoid diverticulosis   . UTI (urinary tract infection) 05/10/2016   Past Surgical History:  Procedure Laterality Date  . AV FISTULA PLACEMENT Left 06/02/2015   Procedure: BRACHIOCEPHALIC ARTERIOVENOUS (AV) FISTULA CREATION ;  Surgeon: Conrad Fisher, MD;  Location: [place];  Service: Vascular;  Laterality: Left;  . BASCILIC VEIN TRANSPOSITION Left 07/27/2015   Procedure:  FIRST STAGE BASILIC VEIN TRANSPOSITION LEFT UPPER ARM;  Surgeon: Conrad Richville, MD;  Location: [place];  Service: Vascular;  Laterality: Left;  . BASCILIC VEIN TRANSPOSITION Left 09/2015   second phase  . BASCILIC VEIN TRANSPOSITION Left 10/12/2015   Procedure: SECOND STAGE BASILIC VEIN TRANSPOSITION LEFT ARM;  Surgeon: Conrad , MD;  Location: [place];  Service: Vascular;  Laterality: Left;  . BREAST BIOPSY Left 08/23/07   benign fibrocystic with duct ectasia  . CARDIAC CATHETERIZATION  07-11-2012  dr Irish Lack   Abnormal stress  test/   normal coronary arteries/  LVEDP  63mmHg  . CARDIOVASCULAR STRESS TEST  06-26-2012  dr Irish Lack   marked ischemia in the basal anterior, mid anterior, apical inferior regions/  normal LVF, ef 63%  . CATARACT EXTRACTION W/ INTRAOCULAR LENS  IMPLANT, BILATERAL    . COLONOSCOPY WITH PROPOFOL N/A 10/17/2016   Procedure: COLONOSCOPY WITH PROPOFOL;  Surgeon: Garlan Fair, MD;  Location: [facility];  Service: Endoscopy;  Laterality: N/A;  . CYSTOSCOPY W/ URETERAL STENT PLACEMENT  04/04/2012   Procedure: CYSTOSCOPY WITH RETROGRADE PYELOGRAM/URETERAL STENT PLACEMENT;  Surgeon: Ailene Rud, MD;  Location: [place];  Service: Urology;  Laterality: Left;  . CYSTOSCOPY W/ URETERAL STENT PLACEMENT Bilateral 05/04/2015   Procedure: CYSTOSCOPY WITH BILATERAL RETROGRADE PYELOGRAM/ WITH INTERPRETATION, EXCHANGE OF RIGHT URETERAL STENT REPLACEMENT AND PLACEMENT LEFT URETERAL STENT PLACEMENT EXAMINATION OF VAGINA;  Surgeon: Carolan Clines, MD;  Location: [facility];  Service: Urology;  Laterality: Bilateral;  . CYSTOSCOPY WITH STENT PLACEMENT Right 10/28/2014   Procedure: RIGHT URETERAL STENT PLACEMENT;  Surgeon: Irine Seal, MD;  Location: [facility];  Service: Urology;  Laterality: Right;  . CYSTOSCOPY WITH STENT PLACEMENT Right 02/26/2015   Procedure: CYSTOSCOPY RETROGRADE PYELOGRAM WITH STENT PLACEMENT;  Surgeon: Cleon Gustin, MD;  Location: [facility];  Service: Urology;  Laterality: Right;  . CYSTOSCOPY/RETROGRADE/URETEROSCOPY/STONE EXTRACTION WITH BASKET Right 11/21/2014   Procedure: CYSTOSCOPY/RIGHT RETROGRADE PYELOGRAM/RIGHT URETEROSCOPY/BASKET EXTRACTION/RIGHT PYELOSCOPY/LASER OF STONE/RIGHT DOUBLE J STENT;  Surgeon: Carolan Clines, MD;  Location: [place];  Service: Urology;  Laterality: Right;  . EXTRACORPOREAL SHOCK WAVE LITHOTRIPSY  05-28-2012  &  10-08-2012  . HOLMIUM LASER APPLICATION Right 12/26/4694   Procedure: HOLMIUM LASER APPLICATION;  Surgeon:  Carolan Clines, MD;  [facility];  Service: Urology;  Laterality: Right;  . IR GENERIC HISTORICAL  02/01/2016   IR NEPHROSTOMY EXCHANGE RIGHT 02/01/2016 Greggory Keen, MD [facility]  . IR GENERIC HISTORICAL  02/24/2016   IR PATIENT EVAL TECH 0-60 MINS 02/24/2016 Aletta Edouard, MD [facility]  . KNEE ARTHROSCOPY Left 02-14-2003  . LAPAROSCOPIC CHOLECYSTECTOMY  03-23-2005  . TOTAL ABDOMINAL HYSTERECTOMY W/ BILATERAL SALPINGOOPHORECTOMY  1993   secondary to fibroids  . TRANSTHORACIC ECHOCARDIOGRAM  04-09-2012   normal LVF,  ef 60-65%,  mild LAE,  mild TR, trivial MR and PR   Family History  Problem Relation Age of Onset  . Hypertension Mother   . Cancer Mother 75       breast  . Dementia Mother   . Hypertension Brother   . Diabetes Brother   . Heart disease Brother        before age 3  . Cancer Father 62       pancreatic  . Heart failure Paternal Grandmother   . Bladder Cancer Maternal Grandfather    Social History:  reports that she has never smoked. She has never used smokeless tobacco. She reports that she does not  drink alcohol or use drugs. Allergies  Allergen Reactions  . Vicodin [Hydrocodone-Acetaminophen] Nausea And Vomiting  . Chlorhexidine Rash    Sunburn    rash  . Percocet [Oxycodone-Acetaminophen] Nausea And Vomiting   Prior to Admission medications   Medication Sig Start Date End Date Taking? Authorizing Provider  acetaminophen (TYLENOL) 500 MG tablet Take 1,000 mg by mouth every 6 (six) hours as needed for moderate pain or headache.    Yes [provider]  amiodarone (PACERONE) 200 MG tablet Take 1 tablet (200 mg total) by mouth daily. 03/07/17  Yes Jettie Booze, MD  bimatoprost (LUMIGAN) 0.01 % SOLN Place 1 drop into both eyes at bedtime.   Yes [provider]  calcium acetate (PHOSLO) 667 MG capsule Take 1,334 mg by mouth 3 (three) times daily with meals.    Yes [provider]  cephALEXin (KEFLEX)  250 MG capsule Take 250 mg by mouth daily.   Yes [provider]  Multiple Vitamins-Minerals (MULTIVITAMIN WITH MINERALS) tablet Take 1 tablet by mouth daily.   Yes [provider]  ranitidine (ZANTAC) 150 MG tablet Take 150 mg by mouth 2 (two) times daily as needed for heartburn.   Yes [provider]  saccharomyces boulardii (FLORASTOR) 250 MG capsule Take 1 capsule (250 mg total) by mouth 2 (two) times daily. 07/08/15  Yes Donne Hazel, MD  sodium chloride (OCEAN) 0.65 % SOLN nasal spray Place 1 spray into both nostrils 3 (three) times daily as needed for congestion.   Yes [provider]  timolol (BETIMOL) 0.5 % ophthalmic solution Place 1 drop into both eyes every morning.    Yes [provider]  warfarin (COUMADIN) 5 MG tablet TAKE AS DIRECTED BY COUMADIN CLINIC. Patient taking differently: Take 5 mg by mouth daily. 5 mg on Monday, Wednesday and Friday. All other days take 2.5 mg 03/07/17  Yes Jettie Booze, MD   Current Facility-Administered Medications  Medication Dose Route Frequency Provider Last Rate Last Dose  . acetaminophen (TYLENOL) tablet 1,000 mg  1,000 mg Oral Q6H PRN Florencia Reasons, MD      . amiodarone (PACERONE) tablet 200 mg  200 mg Oral Daily Florencia Reasons, MD   200 mg at 11/25/17 1100  . calcium acetate (PHOSLO) capsule 1,334 mg  1,334 mg Oral TID WC Florencia Reasons, MD   1,334 mg at 11/25/17 1117  . [START ON 11/26/2017] cefTRIAXone (ROCEPHIN) 2 g in sodium chloride 0.9 % 100 mL IVPB  2 g Intravenous Q24H Florencia Reasons, MD      . feeding supplement (NEPRO CARB STEADY) liquid 237 mL  237 mL Oral BID BM Florencia Reasons, MD   237 mL at 11/25/17 1100  . latanoprost (XALATAN) 0.005 % ophthalmic solution 1 drop  1 drop Both Eyes QHS Florencia Reasons, MD      . multivitamin with minerals tablet 1 tablet  1 tablet Oral Daily Florencia Reasons, MD   1 tablet at 11/25/17 1100  . saccharomyces boulardii (FLORASTOR) capsule 250 mg  250 mg Oral BID Florencia Reasons, MD   250 mg at 11/25/17  1100  . sodium chloride (OCEAN) 0.65 % nasal spray 1 spray  1 spray Each Nare TID PRN Florencia Reasons, MD      . timolol (TIMOPTIC) 0.5 % ophthalmic solution 1 drop  1 drop Both Eyes q morning - 10a Florencia Reasons, MD   1 drop at 11/25/17 1100  . warfarin (COUMADIN) tablet 2.5 mg  2.5 mg Oral  Ritta Slot, RPH      . Warfarin - Pharmacist Dosing Inpatient   Does not apply q1800 Jalene Mullet, RPH        ROS: As per HPI otherwise negative.  Physical Exam: Vitals:   11/25/17 0630 11/25/17 0715 11/25/17 0827 11/25/17 1100  BP: (!) 132/52 (!) 112/47 (!) 154/61 (!) 145/76  Pulse: 77 67 74 73  Resp: 19  18   Temp:   97.6 F (36.4 C)   TempSrc:   Oral   SpO2: 94% 97% 95%   Weight:   89.4 kg (197 lb 1.6 oz)   Height:   5\' 3"  (1.6 m)      General: WDWN obese female  Head: NCAT sclera not icteric MMM Neck: Supple. No JVD No masses Lungs: CTA bilaterally without wheezes, rales, or rhonchi. Breathing is unlabored. Heart: RRR with S1 S2 Abdomen: soft NT + BS Lower extremities:without edema or ischemic changes, no open wounds  Neuro: A & O  X 3. Moves all extremities spontaneously. Psych:  Pleasant. Normal affect  Dialysis Access: L forearm AVF +bruit   Labs: Basic Metabolic Panel: Recent Labs  Lab 11/24/17 2108  NA 137  K 4.0  CL 98*  CO2 27  GLUCOSE 118*  BUN 30*  CREATININE 5.73*  CALCIUM 8.7*   Liver Function Tests: No results for input(s): AST, ALT, ALKPHOS, BILITOT, PROT, ALBUMIN in the last 168 hours. No results for input(s): LIPASE, AMYLASE in the last 168 hours. No results for input(s): AMMONIA in the last 168 hours. CBC: Recent Labs  Lab 11/24/17 2108  WBC 16.1*  NEUTROABS 14.7*  HGB 11.3*  HCT 35.2*  MCV 94.6  PLT 208   Cardiac Enzymes: No results for input(s): CKTOTAL, CKMB, CKMBINDEX, TROPONINI in the last 168 hours. CBG: No results for input(s): GLUCAP in the last 168 hours. Iron Studies: No results for input(s): IRON, TIBC, TRANSFERRIN, FERRITIN in  the last 72 hours. Studies/Results: Ct Renal Stone Study  Result Date: 11/24/2017 CLINICAL DATA:  Hematuria with right lower back pain beginning this afternoon. EXAM: CT ABDOMEN AND PELVIS WITHOUT CONTRAST TECHNIQUE: Multidetector CT imaging of the abdomen and pelvis was performed following the standard protocol without IV contrast. COMPARISON:  05/02/2015 FINDINGS: Lower chest: Mild left basilar atelectasis. Moderate size hiatal hernia. Moderate calcified plaque over the left main and lateral circumflex coronary arteries. Hepatobiliary: Previous cholecystectomy. The liver and biliary tree are normal. Pancreas: Normal. Spleen: Normal. Adrenals/Urinary Tract: Adrenal glands are normal. Atrophic left kidney with a few punctate calcifications unchanged. Subtle chronic prominence of the left intrarenal collecting system and ureter. No left ureteral stones. There is mild right-sided hydronephrosis with moderate right perinephric inflammation/fluid. Single punctate stone over the lower pole right kidney. Right ureter is dilated proximally. Possible faint punctate stone over the distal right ureter although this is not occlusive. Bladder is collapsed. Stomach/Bowel: Moderate size hiatal hernia. Small bowel is within normal. Appendix is normal. There is diverticulosis of the colon most prominent over the sigmoid colon without active inflammation. Vascular/Lymphatic: Slight increase in size of a periaortic lymph node measuring 1.4 cm by long axis. Small retrocaval lymph node unchanged. Vascular structures are unremarkable. Reproductive: Previous hysterectomy. Other: Small umbilical hernia containing only mesenteric fat. Diastases of the rectus abdominus muscles over the lower abdomen/pelvis with associated herniation of peritoneal fat unchanged. Musculoskeletal: Degenerative changes of the spine without significant change. IMPRESSION: Bilateral nephrolithiasis. Mild right-sided hydronephrosis with moderate right  perinephric inflammation/fluid. Possible punctate distal right ureteral stone,  although not conclusive. Findings may be due to recent passage of a stone versus infection. Colonic diverticulosis. Umbilical hernia and midline lower abdominal/pelvic ventral hernia containing only peritoneal fat unchanged. Moderate size hiatal hernia. Atherosclerotic coronary artery disease. Electronically Signed   By: Marin Olp M.D.   On: 11/24/2017 22:23    Dialysis Orders:  [facility] TTS 4h 180NRe 400/800 EDW 89kg 2K/2Ca -L AVF No heparin -Calcitriol 0.29mcg TIW  Assessment/Plan: 1. R pyelonephritis/ R hydro/bilat nephrolithiasis seen on CT. Blood/urine cultures pending. Urology consult pending. On Rocephin - per primary 2. ESRD -  TTS. For HD today on schedule.  3. Hypertension/volume  - BP controlled/Volume stable. No excess on exam 4. Anemia  - Hgb 11.3. No ESA needs currently  5. Metabolic bone disease -  Continue Calcitriol/binders  6. Nutrition - Renal diet vitamins 7. P AFib -Amiodarone/Coumadin per pharmacy   Lynnda Child PA-C [facility] Pager [phone] 11/25/2017, 1:10 PM    Pt seen, examined and agree w A/P as above.  Kelly Splinter MD [employer] pager [phone]   11/25/2017, 3:20 PM

## 2017-11-25 NOTE — Progress Notes (Signed)
Page to Dr Erlinda Hong  3e26 [name] requesting pain medication more than Tylenol before dialysis, hydro/oxy allergies.

## 2017-11-25 NOTE — Consult Note (Addendum)
Urology Consult   Physician requesting consult: Florencia Reasons, MD  Reason for consult: Right pyelonephritis, possible kidney stone  History of Present Illness: [name] is a 72 y.o. female w/ a hx of ESRD, recurrent UTIs (currently treated with daily Keflex--last UTI was 2 years ago) and kidney stones who presented to the [facility] ED on 11/24/17 with a 24 hour hx of acute right flank pain and gross hematuria.  The patient describes the pain as sharp, intermittent and without radiation.  The pain is alleviated with narcotic pain medications and has no aggrevating factors.   She had a CTSS on 6/7 that demonstrated right sided hydro with a possible right distal ureteral stone vs recently passed stone.  Currently, the patient is resting comfortably in bed and has no complaints.  She is voiding w/o difficulty (she still voids several times per day) and states that her hematuria is still present, but improving.   Past Medical History:  Diagnosis Date  . Cardiomyopathy ([place])    a. h/o LV dysfunction EF 20-25% in 2013 due to sepsis.>> improved to normal   . Chronic diastolic CHF (congestive heart failure) ([place])    10/ 2013 in setting of septic shock  . Complication of anesthesia    use a little anesthesia , per patient MD states she quit breathing (2016); hard to wake up  . ESRD (end stage renal disease) ([place])    dialysis Tues, Thurs, Sat [address], sees dr deterding   . GERD (gastroesophageal reflux disease)   . Glaucoma    both eyes  . H/O hiatal hernia    a. CT 2017: large gastric hiatal hernia.  Marland Kitchen Headache(784.0)    migraine hx of  . History of blood transfusion 04/13/2015   . History of echocardiogram    a. Echo 6/17: EF 60-65%, normal wall motion, mild MR, atrial septal lipomatous hypertrophy, PASP 34 mmHg, possible trivial free-flowing pericardial effusion along RV free wall // b. Echo 5/17: Mild LVH, EF 55-60%, normal wall motion, grade 1 diastolic dysfunction, trivial MR, severe LAE, mild  RAE, PASP 42 mmHg  . History of nephrostomy 04/11/2015   currently inplace 04/28/2015  removed now  . History of nuclear stress test    a. Myoview 1/14 - Marked ischemia in the basal anterior, mid anterior, apical septal and apical inferior regions, EF 63% >> LHC normal   . Hyperlipidemia   . Hypertension    medication removed from regimen due to low blood pressure   . Iron deficiency    hx  . Nephrolithiasis 2002, 2006   bilateral  . Normal coronary arteries 2014   a. LHC in 1/14: normal coronary arteries  . PAF (paroxysmal atrial fibrillation) ([place])    a. 10/ 2013  in setting of Septic Shock //  b. recurrent during admit for pneumonia, L effusion >> placed on Amiodarone // Coumadin for anticoagulation  . Pneumonia jan 2018, last tme lungs clear now   dx 10-06-2014 per CXR--  on 10-27-2014 pt states finished antibiotic and denies cough or fever  . S/P hemodialysis catheter insertion ([place]) 04/11/2015    right anterior chest , only used once   . Sigmoid diverticulosis   . UTI (urinary tract infection) 05/10/2016    Past Surgical History:  Procedure Laterality Date  . AV FISTULA PLACEMENT Left 06/02/2015   Procedure: BRACHIOCEPHALIC ARTERIOVENOUS (AV) FISTULA CREATION ;  Surgeon: Conrad Warson Woods, MD;  Location: [place];  Service: Vascular;  Laterality: Left;  . BASCILIC  VEIN TRANSPOSITION Left 07/27/2015   Procedure: FIRST STAGE BASILIC VEIN TRANSPOSITION LEFT UPPER ARM;  Surgeon: Conrad Walthall, MD;  Location: [place];  Service: Vascular;  Laterality: Left;  . BASCILIC VEIN TRANSPOSITION Left 09/2015   second phase  . BASCILIC VEIN TRANSPOSITION Left 10/12/2015   Procedure: SECOND STAGE BASILIC VEIN TRANSPOSITION LEFT ARM;  Surgeon: Conrad Astatula, MD;  Location: [place];  Service: Vascular;  Laterality: Left;  . BREAST BIOPSY Left 08/23/07   benign fibrocystic with duct ectasia  . CARDIAC CATHETERIZATION  07-11-2012  dr Irish Lack   Abnormal stress test/   normal coronary arteries/  LVEDP  14mmHg  .  CARDIOVASCULAR STRESS TEST  06-26-2012  dr Irish Lack   marked ischemia in the basal anterior, mid anterior, apical inferior regions/  normal LVF, ef 63%  . CATARACT EXTRACTION W/ INTRAOCULAR LENS  IMPLANT, BILATERAL    . COLONOSCOPY WITH PROPOFOL N/A 10/17/2016   Procedure: COLONOSCOPY WITH PROPOFOL;  Surgeon: Garlan Fair, MD;  Location: [facility];  Service: Endoscopy;  Laterality: N/A;  . CYSTOSCOPY W/ URETERAL STENT PLACEMENT  04/04/2012   Procedure: CYSTOSCOPY WITH RETROGRADE PYELOGRAM/URETERAL STENT PLACEMENT;  Surgeon: Ailene Rud, MD;  Location: [place];  Service: Urology;  Laterality: Left;  . CYSTOSCOPY W/ URETERAL STENT PLACEMENT Bilateral 05/04/2015   Procedure: CYSTOSCOPY WITH BILATERAL RETROGRADE PYELOGRAM/ WITH INTERPRETATION, EXCHANGE OF RIGHT URETERAL STENT REPLACEMENT AND PLACEMENT LEFT URETERAL STENT PLACEMENT EXAMINATION OF VAGINA;  Surgeon: Carolan Clines, MD;  Location: [facility];  Service: Urology;  Laterality: Bilateral;  . CYSTOSCOPY WITH STENT PLACEMENT Right 10/28/2014   Procedure: RIGHT URETERAL STENT PLACEMENT;  Surgeon: Irine Seal, MD;  Location[facility];  Service: Urology;  Laterality: Right;  . CYSTOSCOPY WITH STENT PLACEMENT Right 02/26/2015   Procedure: CYSTOSCOPY RETROGRADE PYELOGRAM WITH STENT PLACEMENT;  Surgeon: Cleon Gustin, MD;  Location: [facility];  Service: Urology;  Laterality: Right;  . CYSTOSCOPY/RETROGRADE/URETEROSCOPY/STONE EXTRACTION WITH BASKET Right 11/21/2014   Procedure: CYSTOSCOPY/RIGHT RETROGRADE PYELOGRAM/RIGHT URETEROSCOPY/BASKET EXTRACTION/RIGHT PYELOSCOPY/LASER OF STONE/RIGHT DOUBLE J STENT;  Surgeon: Carolan Clines, MD;  Location: [place];  Service: Urology;  Laterality: Right;  . EXTRACORPOREAL SHOCK WAVE LITHOTRIPSY  05-28-2012  &  10-08-2012  . HOLMIUM LASER APPLICATION Right 1/0/2585   Procedure: HOLMIUM LASER APPLICATION;  Surgeon: Carolan Clines, MD;  Location: [facility];  Service: Urology;  Laterality: Right;  . IR GENERIC HISTORICAL  02/01/2016   IR NEPHROSTOMY EXCHANGE RIGHT 02/01/2016 Greggory Keen, MD [facility]  . IR GENERIC HISTORICAL  02/24/2016   IR PATIENT EVAL TECH 0-60 MINS 02/24/2016 Aletta Edouard, MD [facility]  . KNEE ARTHROSCOPY Left 02-14-2003  . LAPAROSCOPIC CHOLECYSTECTOMY  03-23-2005  . TOTAL ABDOMINAL HYSTERECTOMY W/ BILATERAL SALPINGOOPHORECTOMY  1993   secondary to fibroids  . TRANSTHORACIC ECHOCARDIOGRAM  04-09-2012   normal LVF,  ef 60-65%,  mild LAE,  mild TR, trivial MR and PR    Current Hospital Medications:  Home Meds:  Current Meds  Medication Sig  . acetaminophen (TYLENOL) 500 MG tablet Take 1,000 mg by mouth every 6 (six) hours as needed for moderate pain or headache.   Marland Kitchen amiodarone (PACERONE) 200 MG tablet Take 1 tablet (200 mg total) by mouth daily.  . bimatoprost (LUMIGAN) 0.01 % SOLN Place 1 drop into both eyes at bedtime.  . calcium acetate (PHOSLO) 667 MG capsule Take 1,334 mg by mouth 3 (three) times daily with meals.   . cephALEXin (KEFLEX) 250 MG capsule Take 250  mg by mouth daily.  . Multiple Vitamins-Minerals (MULTIVITAMIN WITH MINERALS) tablet Take 1 tablet by mouth daily.  . ranitidine (ZANTAC) 150 MG tablet Take 150 mg by mouth 2 (two) times daily as needed for heartburn.  . saccharomyces boulardii (FLORASTOR) 250 MG capsule Take 1 capsule (250 mg total) by mouth 2 (two) times daily.  . sodium chloride (OCEAN) 0.65 % SOLN nasal spray Place 1 spray into both nostrils 3 (three) times daily as needed for congestion.  . timolol (BETIMOL) 0.5 % ophthalmic solution Place 1 drop into both eyes every morning.   . warfarin (COUMADIN) 5 MG tablet TAKE AS DIRECTED BY COUMADIN CLINIC. (Patient taking differently: Take 5 mg by mouth daily. 5 mg on Monday, Wednesday and Friday. All other days take 2.5 mg)    Scheduled Meds: . amiodarone  200 mg Oral Daily  . calcitRIOL  0.25 mcg Oral Q T,Th,Sa-HD  . calcium  acetate  1,334 mg Oral TID WC  . feeding supplement (NEPRO CARB STEADY)  237 mL Oral BID BM  . latanoprost  1 drop Both Eyes QHS  . multivitamin with minerals  1 tablet Oral Daily  . saccharomyces boulardii  250 mg Oral BID  . timolol  1 drop Both Eyes q morning - 10a  . warfarin  2.5 mg Oral ONCE-1800  . Warfarin - Pharmacist Dosing Inpatient   Does not apply q1800   Continuous Infusions: . [START ON 11/26/2017] cefTRIAXone (ROCEPHIN)  IV     PRN Meds:.acetaminophen, sodium chloride  Allergies:  Allergies  Allergen Reactions  . Vicodin [Hydrocodone-Acetaminophen] Nausea And Vomiting  . Chlorhexidine Rash    Sunburn    rash  . Percocet [Oxycodone-Acetaminophen] Nausea And Vomiting    Family History  Problem Relation Age of Onset  . Hypertension Mother   . Cancer Mother 8       breast  . Dementia Mother   . Hypertension Brother   . Diabetes Brother   . Heart disease Brother        before age 79  . Cancer Father 57       pancreatic  . Heart failure Paternal Grandmother   . Bladder Cancer Maternal Grandfather     Social History:  reports that she has never smoked. She has never used smokeless tobacco. She reports that she does not drink alcohol or use drugs.  ROS: A complete review of systems was performed.  All systems are negative except for pertinent findings as noted.  Physical Exam:  Vital signs in last 24 hours: Temp:  [97.6 F (36.4 C)-99.4 F (37.4 C)] 97.6 F (36.4 C) (06/08 0827) Pulse Rate:  [67-83] 73 (06/08 1100) Resp:  [16-19] 18 (06/08 0827) BP: (112-157)/(42-85) 145/76 (06/08 1100) SpO2:  [83 %-97 %] 95 % (06/08 0827) Weight:  [89.4 kg (197 lb 1.6 oz)] 89.4 kg (197 lb 1.6 oz) (06/08 0827) Constitutional:  Alert and oriented, No acute distress Cardiovascular: Regular rate and rhythm, No JVD Respiratory: Normal respiratory effort, Lungs clear bilaterally GI: Abdomen is soft, nontender, nondistended, no abdominal masses GU: No CVA  tenderness Lymphatic: No lymphadenopathy Neurologic: Grossly intact, no focal deficits Psychiatric: Normal mood and affect  Laboratory Data:  Recent Labs    11/24/17 2108  WBC 16.1*  HGB 11.3*  HCT 35.2*  PLT 208    Recent Labs    11/24/17 2108  NA 137  K 4.0  CL 98*  GLUCOSE 118*  BUN 30*  CALCIUM 8.7*  CREATININE 5.73*  Results for orders placed or performed during the hospital encounter of 11/24/17 (from the past 24 hour(s))  Basic metabolic panel     Status: Abnormal   Collection Time: 11/24/17  9:08 PM  Result Value Ref Range   Sodium 137 135 - 145 mmol/L   Potassium 4.0 3.5 - 5.1 mmol/L   Chloride 98 (L) 101 - 111 mmol/L   CO2 27 22 - 32 mmol/L   Glucose, Bld 118 (H) 65 - 99 mg/dL   BUN 30 (H) 6 - 20 mg/dL   Creatinine, Ser 5.73 (H) 0.44 - 1.00 mg/dL   Calcium 8.7 (L) 8.9 - 10.3 mg/dL   GFR calc non Af Amer 7 (L) >60 mL/min   GFR calc Af Amer 8 (L) >60 mL/min   Anion gap 12 5 - 15  CBC with Differential     Status: Abnormal   Collection Time: 11/24/17  9:08 PM  Result Value Ref Range   WBC 16.1 (H) 4.0 - 10.5 K/uL   RBC 3.72 (L) 3.87 - 5.11 MIL/uL   Hemoglobin 11.3 (L) 12.0 - 15.0 g/dL   HCT 35.2 (L) 36.0 - 46.0 %   MCV 94.6 78.0 - 100.0 fL   MCH 30.4 26.0 - 34.0 pg   MCHC 32.1 30.0 - 36.0 g/dL   RDW 14.8 11.5 - 15.5 %   Platelets 208 150 - 400 K/uL   Neutrophils Relative % 90 %   Neutro Abs 14.7 (H) 1.7 - 7.7 K/uL   Lymphocytes Relative 3 %   Lymphs Abs 0.4 (L) 0.7 - 4.0 K/uL   Monocytes Relative 5 %   Monocytes Absolute 0.8 0.1 - 1.0 K/uL   Eosinophils Relative 1 %   Eosinophils Absolute 0.1 0.0 - 0.7 K/uL   Basophils Relative 0 %   Basophils Absolute 0.0 0.0 - 0.1 K/uL   Immature Granulocytes 1 %   Abs Immature Granulocytes 0.1 0.0 - 0.1 K/uL  Protime-INR     Status: Abnormal   Collection Time: 11/25/17  2:33 AM  Result Value Ref Range   Prothrombin Time 26.5 (H) 11.4 - 15.2 seconds   INR 2.46   I-Stat CG4 Lactic Acid, ED     Status:  None   Collection Time: 11/25/17  2:41 AM  Result Value Ref Range   Lactic Acid, Venous 1.56 0.5 - 1.9 mmol/L  Urinalysis, Routine w reflex microscopic     Status: Abnormal   Collection Time: 11/25/17  3:04 AM  Result Value Ref Range   Color, Urine RED (A) YELLOW   APPearance TURBID (A) CLEAR   Specific Gravity, Urine 1.015 1.005 - 1.030   pH 8.0 5.0 - 8.0   Glucose, UA 250 (A) NEGATIVE mg/dL   Hgb urine dipstick LARGE (A) NEGATIVE   Bilirubin Urine SMALL (A) NEGATIVE   Ketones, ur 15 (A) NEGATIVE mg/dL   Protein, ur >300 (A) NEGATIVE mg/dL   Nitrite POSITIVE (A) NEGATIVE   Leukocytes, UA LARGE (A) NEGATIVE  Urinalysis, Microscopic (reflex)     Status: Abnormal   Collection Time: 11/25/17  3:04 AM  Result Value Ref Range   RBC / HPF >50 0 - 5 RBC/hpf   WBC, UA >50 0 - 5 WBC/hpf   Bacteria, UA FEW (A) NONE SEEN   Squamous Epithelial / LPF 0-5 0 - 5  I-Stat CG4 Lactic Acid, ED     Status: None   Collection Time: 11/25/17  4:43 AM  Result Value Ref Range   Lactic Acid,  Venous 1.28 0.5 - 1.9 mmol/L  MRSA PCR Screening     Status: Abnormal   Collection Time: 11/25/17  8:46 AM  Result Value Ref Range   MRSA by PCR POSITIVE (A) NEGATIVE   Recent Results (from the past 240 hour(s))  MRSA PCR Screening     Status: Abnormal   Collection Time: 11/25/17  8:46 AM  Result Value Ref Range Status   MRSA by PCR POSITIVE (A) NEGATIVE Final    Comment:        The GeneXpert MRSA Assay (FDA approved for NASAL specimens only), is one component of a comprehensive MRSA colonization surveillance program. It is not intended to diagnose MRSA infection nor to guide or monitor treatment for MRSA infections. RESULT CALLED TO, READ BACK BY AND VERIFIED WITH: AKarrie Doffing, RN AT 1207 ON 11/25/17 BY C. JESSUP, MLT     Renal Function: Recent Labs    11/24/17 2108  CREATININE 5.73*   Estimated Creatinine Clearance: 9.3 mL/min (A) (by C-G formula based on SCr of 5.73 mg/dL (H)).  Radiologic  Imaging: Ct Renal Stone Study  Result Date: 11/24/2017 CLINICAL DATA:  Hematuria with right lower back pain beginning this afternoon. EXAM: CT ABDOMEN AND PELVIS WITHOUT CONTRAST TECHNIQUE: Multidetector CT imaging of the abdomen and pelvis was performed following the standard protocol without IV contrast. COMPARISON:  05/02/2015 FINDINGS: Lower chest: Mild left basilar atelectasis. Moderate size hiatal hernia. Moderate calcified plaque over the left main and lateral circumflex coronary arteries. Hepatobiliary: Previous cholecystectomy. The liver and biliary tree are normal. Pancreas: Normal. Spleen: Normal. Adrenals/Urinary Tract: Adrenal glands are normal. Atrophic left kidney with a few punctate calcifications unchanged. Subtle chronic prominence of the left intrarenal collecting system and ureter. No left ureteral stones. There is mild right-sided hydronephrosis with moderate right perinephric inflammation/fluid. Single punctate stone over the lower pole right kidney. Right ureter is dilated proximally. Possible faint punctate stone over the distal right ureter although this is not occlusive. Bladder is collapsed. Stomach/Bowel: Moderate size hiatal hernia. Small bowel is within normal. Appendix is normal. There is diverticulosis of the colon most prominent over the sigmoid colon without active inflammation. Vascular/Lymphatic: Slight increase in size of a periaortic lymph node measuring 1.4 cm by long axis. Small retrocaval lymph node unchanged. Vascular structures are unremarkable. Reproductive: Previous hysterectomy. Other: Small umbilical hernia containing only mesenteric fat. Diastases of the rectus abdominus muscles over the lower abdomen/pelvis with associated herniation of peritoneal fat unchanged. Musculoskeletal: Degenerative changes of the spine without significant change. IMPRESSION: Bilateral nephrolithiasis. Mild right-sided hydronephrosis with moderate right perinephric inflammation/fluid.  Possible punctate distal right ureteral stone, although not conclusive. Findings may be due to recent passage of a stone versus infection. Colonic diverticulosis. Umbilical hernia and midline lower abdominal/pelvic ventral hernia containing only peritoneal fat unchanged. Moderate size hiatal hernia. Atherosclerotic coronary artery disease. Electronically Signed   By: Marin Olp M.D.   On: 11/24/2017 22:23    I independently reviewed the above imaging studies.  Impression/Recommendation Acute right pyelonephritis Possible punctate right distal ureteral stone  Right hydronephrosis ESRD  -Blood and urine cx pending.  Currently on IV rocephin.  AFVSS and sxs controlled. -Will continue to monitor her clinically.  IF she has a stone, the likelihood of her passing it w/o surgical intervention is 80-90%. -NPO after midnight for possible stent placement should her clinical status deteriorate   Ellison Hughs, MD [facility] 11/25/2017, 1:20 PM

## 2017-11-25 NOTE — ED Notes (Signed)
Pt's O2 sats dropped to 90, placed on 2L nasal canula.

## 2017-11-25 NOTE — ED Notes (Signed)
Attempted report x1. 

## 2017-11-25 NOTE — Progress Notes (Signed)
Patient refusing to let tech do vitals since RN checked her BP with medication administration over one hour ago.

## 2017-11-25 NOTE — H&P (Signed)
History and Physical  [name] XTG:[id number] DOB: July 30, 1945 DOA: 11/24/2017  Referring physician: EDP PCP: Lajean Manes, MD   Chief Complaint: right Sided flank pain  HPI: [name] is a 72 y.o. female   History of end-stage renal disease on dialysis Tuesday Thursday Saturday, history of PAF on Coumadin and amiodarone, history of bilateral kidney stone, history of right-sided nephrostomy tube removed 2 years ago, presented to the emergency room due to complaint of right-sided flank pain and transient gross hematuria. She has associated nausea, no fever.  She states she has not had UTI in the last 2 years until now, since her urologist started her on a daily Keflex prophylaxis.  ED course: T-max 99.4 on presentation, heart rate and blood pressure stable no hypoxia.  Sinus rhythm on tele. WBC elevated at 16, Actiq acid on presentation 1.56, turbid urine with RBC and large hemoglobin large leukocyte positive nitrite. Ct renal stone study: Bilateral nephrolithiasis. Mild right-sided hydronephrosis with moderate right perinephric inflammation/fluid. Possible punctate distal right ureteral stone, although not conclusive. Findings may be due to recent passage of a stone versus infection", urine culture and blood culture collected in the ED, started on Rocephin hospitalist called to further manage the patient.  Review of Systems:  Detail per HPI, Review of systems are otherwise negative  Past Medical History:  Diagnosis Date  . Cardiomyopathy ([place])    a. h/o LV dysfunction EF 20-25% in 2013 due to sepsis.>> improved to normal   . Chronic diastolic CHF (congestive heart failure) ([place])    10/ 2013 in setting of septic shock  . Complication of anesthesia    use a little anesthesia , per patient MD states she quit breathing (2016); hard to wake up  . ESRD (end stage renal disease) ([place])    dialysis Tues, Thurs, Sat [address], sees dr deterding   . GERD (gastroesophageal reflux  disease)   . Glaucoma    both eyes  . H/O hiatal hernia    a. CT 2017: large gastric hiatal hernia.  Marland Kitchen Headache(784.0)    migraine hx of  . History of blood transfusion 04/13/2015   . History of echocardiogram    a. Echo 6/17: EF 60-65%, normal wall motion, mild MR, atrial septal lipomatous hypertrophy, PASP 34 mmHg, possible trivial free-flowing pericardial effusion along RV free wall // b. Echo 5/17: Mild LVH, EF 55-60%, normal wall motion, grade 1 diastolic dysfunction, trivial MR, severe LAE, mild RAE, PASP 42 mmHg  . History of nephrostomy 04/11/2015   currently inplace 04/28/2015  removed now  . History of nuclear stress test    a. Myoview 1/14 - Marked ischemia in the basal anterior, mid anterior, apical septal and apical inferior regions, EF 63% >> LHC normal   . Hyperlipidemia   . Hypertension    medication removed from regimen due to low blood pressure   . Iron deficiency    hx  . Nephrolithiasis 2002, 2006   bilateral  . Normal coronary arteries 2014   a. LHC in 1/14: normal coronary arteries  . PAF (paroxysmal atrial fibrillation) ([place])    a. 10/ 2013  in setting of Septic Shock //  b. recurrent during admit for pneumonia, L effusion >> placed on Amiodarone // Coumadin for anticoagulation  . Pneumonia jan 2018, last tme lungs clear now   dx 10-06-2014 per CXR--  on 10-27-2014 pt states finished antibiotic and denies cough or fever  . S/P hemodialysis catheter insertion ([place]) 04/11/2015  right anterior chest , only used once   . Sigmoid diverticulosis   . UTI (urinary tract infection) 05/10/2016   Past Surgical History:  Procedure Laterality Date  . AV FISTULA PLACEMENT Left 06/02/2015   Procedure: BRACHIOCEPHALIC ARTERIOVENOUS (AV) FISTULA CREATION ;  Surgeon: Conrad Sidney, MD;  Location: [place];  Service: Vascular;  Laterality: Left;  . BASCILIC VEIN TRANSPOSITION Left 07/27/2015   Procedure: FIRST STAGE BASILIC VEIN TRANSPOSITION LEFT UPPER ARM;  Surgeon: Conrad Hueytown, MD;  Location: [place];  Service: Vascular;  Laterality: Left;  . BASCILIC VEIN TRANSPOSITION Left 09/2015   second phase  . BASCILIC VEIN TRANSPOSITION Left 10/12/2015   Procedure: SECOND STAGE BASILIC VEIN TRANSPOSITION LEFT ARM;  Surgeon: Conrad Accord, MD;  Location: [facility];  Service: Vascular;  Laterality: Left;  . BREAST BIOPSY Left 08/23/07   benign fibrocystic with duct ectasia  . CARDIAC CATHETERIZATION  07-11-2012  dr Irish Lack   Abnormal stress test/   normal coronary arteries/  LVEDP  60mmHg  . CARDIOVASCULAR STRESS TEST  06-26-2012  dr Irish Lack   marked ischemia in the basal anterior, mid anterior, apical inferior regions/  normal LVF, ef 63%  . CATARACT EXTRACTION W/ INTRAOCULAR LENS  IMPLANT, BILATERAL    . COLONOSCOPY WITH PROPOFOL N/A 10/17/2016   Procedure: COLONOSCOPY WITH PROPOFOL;  Surgeon: Garlan Fair, MD;  Location: [facility];  Service: Endoscopy;  Laterality: N/A;  . CYSTOSCOPY W/ URETERAL STENT PLACEMENT  04/04/2012   Procedure: CYSTOSCOPY WITH RETROGRADE PYELOGRAM/URETERAL STENT PLACEMENT;  Surgeon: Ailene Rud, MD;  Location: [place];  Service: Urology;  Laterality: Left;  . CYSTOSCOPY W/ URETERAL STENT PLACEMENT Bilateral 05/04/2015   Procedure: CYSTOSCOPY WITH BILATERAL RETROGRADE PYELOGRAM/ WITH INTERPRETATION, EXCHANGE OF RIGHT URETERAL STENT REPLACEMENT AND PLACEMENT LEFT URETERAL STENT PLACEMENT EXAMINATION OF VAGINA;  Surgeon: Carolan Clines, MD;  Location: [facility];  Service: Urology;  Laterality: Bilateral;  . CYSTOSCOPY WITH STENT PLACEMENT Right 10/28/2014   Procedure: RIGHT URETERAL STENT PLACEMENT;  Surgeon: Irine Seal, MD;  Location: [facility];  Service: Urology;  Laterality: Right;  . CYSTOSCOPY WITH STENT PLACEMENT Right 02/26/2015   Procedure: CYSTOSCOPY RETROGRADE PYELOGRAM WITH STENT PLACEMENT;  Surgeon: Cleon Gustin, MD;  Location: [facility];  Service: Urology;  Laterality: Right;  .  CYSTOSCOPY/RETROGRADE/URETEROSCOPY/STONE EXTRACTION WITH BASKET Right 11/21/2014   Procedure: CYSTOSCOPY/RIGHT RETROGRADE PYELOGRAM/RIGHT URETEROSCOPY/BASKET EXTRACTION/RIGHT PYELOSCOPY/LASER OF STONE/RIGHT DOUBLE J STENT;  Surgeon: Carolan Clines, MD;  Location: [place];  Service: Urology;  Laterality: Right;  . EXTRACORPOREAL SHOCK WAVE LITHOTRIPSY  05-28-2012  &  10-08-2012  . HOLMIUM LASER APPLICATION Right 12/23/1023   Procedure: HOLMIUM LASER APPLICATION;  Surgeon: Carolan Clines, MD;  Location: [facility];  Service: Urology;  Laterality: Right;  . IR GENERIC HISTORICAL  02/01/2016   IR NEPHROSTOMY EXCHANGE RIGHT 02/01/2016 Greggory Keen, MD [facility]  . IR GENERIC HISTORICAL  02/24/2016   IR PATIENT EVAL TECH 0-60 MINS 02/24/2016 Aletta Edouard, MD [facility]  . KNEE ARTHROSCOPY Left 02-14-2003  . LAPAROSCOPIC CHOLECYSTECTOMY  03-23-2005  . TOTAL ABDOMINAL HYSTERECTOMY W/ BILATERAL SALPINGOOPHORECTOMY  1993   secondary to fibroids  . TRANSTHORACIC ECHOCARDIOGRAM  04-09-2012   normal LVF,  ef 60-65%,  mild LAE,  mild TR, trivial MR and PR   Social History:  reports that she has never smoked. She has never used smokeless tobacco. She reports that she does not drink alcohol or use drugs. Patient lives at home & is able  to participate in activities of daily living independently   Allergies  Allergen Reactions  . Vicodin [Hydrocodone-Acetaminophen] Nausea And Vomiting  . Chlorhexidine Rash    Sunburn    rash  . Percocet [Oxycodone-Acetaminophen] Nausea And Vomiting    Family History  Problem Relation Age of Onset  . Hypertension Mother   . Cancer Mother 66       breast  . Dementia Mother   . Hypertension Brother   . Diabetes Brother   . Heart disease Brother        before age 92  . Cancer Father 24       pancreatic  . Heart failure Paternal Grandmother   . Bladder Cancer Maternal Grandfather       Prior to Admission medications     Medication Sig Start Date End Date Taking? Authorizing Provider  acetaminophen (TYLENOL) 500 MG tablet Take 1,000 mg by mouth every 6 (six) hours as needed for moderate pain or headache.    Yes [provider]  amiodarone (PACERONE) 200 MG tablet Take 1 tablet (200 mg total) by mouth daily. 03/07/17  Yes Jettie Booze, MD  bimatoprost (LUMIGAN) 0.01 % SOLN Place 1 drop into both eyes at bedtime.   Yes [provider]  calcium acetate (PHOSLO) 667 MG capsule Take 1,334 mg by mouth 3 (three) times daily with meals.    Yes [provider]  cephALEXin (KEFLEX) 250 MG capsule Take 250 mg by mouth daily.   Yes [provider]  Multiple Vitamins-Minerals (MULTIVITAMIN WITH MINERALS) tablet Take 1 tablet by mouth daily.   Yes [provider]  ranitidine (ZANTAC) 150 MG tablet Take 150 mg by mouth 2 (two) times daily as needed for heartburn.   Yes [provider]  saccharomyces boulardii (FLORASTOR) 250 MG capsule Take 1 capsule (250 mg total) by mouth 2 (two) times daily. 07/08/15  Yes Donne Hazel, MD  sodium chloride (OCEAN) 0.65 % SOLN nasal spray Place 1 spray into both nostrils 3 (three) times daily as needed for congestion.   Yes [provider]  timolol (BETIMOL) 0.5 % ophthalmic solution Place 1 drop into both eyes every morning.    Yes [provider]  warfarin (COUMADIN) 5 MG tablet TAKE AS DIRECTED BY COUMADIN CLINIC. Patient taking differently: Take 5 mg by mouth daily. 5 mg on Monday, Wednesday and Friday. All other days take 2.5 mg 03/07/17  Yes Jettie Booze, MD    Physical Exam: BP (!) 154/61 (BP Location: Right Arm)   Pulse 74   Temp 97.6 F (36.4 C) (Oral)   Resp 18   Ht 5\' 3"  (1.6 m)   Wt 89.4 kg (197 lb 1.6 oz)   LMP 01/19/1992 (Approximate)   SpO2 95%   BMI 34.91 kg/m   General:  NAD Eyes: PERRL ENT: unremarkable Neck: supple, no JVD Cardiovascular: RRR, + 2/6 precordial murmur (  reports chronic) Respiratory: CTABL Abdomen: soft/NT/ND, positive bowel sounds, well healed scar on right flank from previous nephrostomy Skin: no rash Musculoskeletal:  No edema Psychiatric: calm/cooperative Neurologic: no focal findings            Labs on Admission:  Basic Metabolic Panel: Recent Labs  Lab 11/24/17 2108  NA 137  K 4.0  CL 98*  CO2 27  GLUCOSE 118*  BUN 30*  CREATININE 5.73*  CALCIUM 8.7*   Liver Function Tests: No results for input(s): AST, ALT, ALKPHOS, BILITOT, PROT, ALBUMIN in the last 168 hours.  No results for input(s): LIPASE, AMYLASE in the last 168 hours. No results for input(s): AMMONIA in the last 168 hours. CBC: Recent Labs  Lab 11/24/17 2108  WBC 16.1*  NEUTROABS 14.7*  HGB 11.3*  HCT 35.2*  MCV 94.6  PLT 208   Cardiac Enzymes: No results for input(s): CKTOTAL, CKMB, CKMBINDEX, TROPONINI in the last 168 hours.  BNP (last 3 results) No results for input(s): BNP in the last 8760 hours.  ProBNP (last 3 results) No results for input(s): PROBNP in the last 8760 hours.  CBG: No results for input(s): GLUCAP in the last 168 hours.  Radiological Exams on Admission: Ct Renal Stone Study  Result Date: 11/24/2017 CLINICAL DATA:  Hematuria with right lower back pain beginning this afternoon. EXAM: CT ABDOMEN AND PELVIS WITHOUT CONTRAST TECHNIQUE: Multidetector CT imaging of the abdomen and pelvis was performed following the standard protocol without IV contrast. COMPARISON:  05/02/2015 FINDINGS: Lower chest: Mild left basilar atelectasis. Moderate size hiatal hernia. Moderate calcified plaque over the left main and lateral circumflex coronary arteries. Hepatobiliary: Previous cholecystectomy. The liver and biliary tree are normal. Pancreas: Normal. Spleen: Normal. Adrenals/Urinary Tract: Adrenal glands are normal. Atrophic left kidney with a few punctate calcifications unchanged. Subtle chronic prominence of the left intrarenal collecting system  and ureter. No left ureteral stones. There is mild right-sided hydronephrosis with moderate right perinephric inflammation/fluid. Single punctate stone over the lower pole right kidney. Right ureter is dilated proximally. Possible faint punctate stone over the distal right ureter although this is not occlusive. Bladder is collapsed. Stomach/Bowel: Moderate size hiatal hernia. Small bowel is within normal. Appendix is normal. There is diverticulosis of the colon most prominent over the sigmoid colon without active inflammation. Vascular/Lymphatic: Slight increase in size of a periaortic lymph node measuring 1.4 cm by long axis. Small retrocaval lymph node unchanged. Vascular structures are unremarkable. Reproductive: Previous hysterectomy. Other: Small umbilical hernia containing only mesenteric fat. Diastases of the rectus abdominus muscles over the lower abdomen/pelvis with associated herniation of peritoneal fat unchanged. Musculoskeletal: Degenerative changes of the spine without significant change. IMPRESSION: Bilateral nephrolithiasis. Mild right-sided hydronephrosis with moderate right perinephric inflammation/fluid. Possible punctate distal right ureteral stone, although not conclusive. Findings may be due to recent passage of a stone versus infection. Colonic diverticulosis. Umbilical hernia and midline lower abdominal/pelvic ventral hernia containing only peritoneal fat unchanged. Moderate size hiatal hernia. Atherosclerotic coronary artery disease. Electronically Signed   By: Marin Olp M.D.   On: 11/24/2017 22:23      Assessment/Plan Present on Admission: . Pyelonephritis  Pyelonephritis, with mild right-sided hydronephrosis, possible punctate distal right ureteral stone -currently Pain-free, hemodynamically stable -Blood culture urine culture in process -continue Rocephin 2 g daily -Case discussed with urology Dr. Lovena Neighbours who will see patient in consult, per Dr. Lovena Neighbours most likely  conservative management, patient urology follow-up.  ESRD on HD MWF -Management per nephrology  PAF: Currently in sinus rhythm continue amiodarone Coumadin per pharmacy  Moderate size hiatal hernia -Currently denies symptom  DVT prophylaxis: On Coumadin  Consultants:  Urology Nephrology  Code Status: full   Family Communication:  Patient   Disposition Plan: admit to med telemetry  Time spent: 41mins  Florencia Reasons MD, PhD [facility] Pager [phone] If 7PM-7AM, please contact night-coverage at [email or web address], password [facility]

## 2017-11-25 NOTE — Progress Notes (Signed)
Signed and held orders released per department protocol.

## 2017-11-25 NOTE — ED Provider Notes (Signed)
[facility] EMERGENCY DEPARTMENT Provider Note   CSN: [id number] Arrival date & time: 11/24/17  2017     History   Chief Complaint Chief Complaint  Patient presents with  . Hematuria    Hx: Kidney Stone    HPI [name] is a 72 y.o. female.  HPI 72 year old female with extensive medical history as below including end-stage renal disease, CHF, history of recurrent kidney stones and UTIs here with right flank pain.  Patient states her symptoms started yesterday.  She states that she developed blood in her urine but had no pain at that time.  Approximately 1 to 2 hours later, yesterday afternoon, she began to develop gradual onset and progressive worsening aching, throbbing, no constant right flank pain.  She said associated nausea.  The pain has not moved.  She is continued to make urine and that does not appear overtly bloody anymore.  Symptoms feel similar to her stones which she is also a history of infected stones with sepsis in the past.  No other complaints.  No recent trauma.  No change in her bowel movements.  No cough or sputum production.  Past Medical History:  Diagnosis Date  . Cardiomyopathy ([place])    a. h/o LV dysfunction EF 20-25% in 2013 due to sepsis.>> improved to normal   . Chronic diastolic CHF (congestive heart failure) ([place])    10/ 2013 in setting of septic shock  . Complication of anesthesia    use a little anesthesia , per patient MD states she quit breathing (2016); hard to wake up  . ESRD (end stage renal disease) ([place])    dialysis Tues, Thurs, Sat [address], sees dr deterding   . GERD (gastroesophageal reflux disease)   . Glaucoma    both eyes  . H/O hiatal hernia    a. CT 2017: large gastric hiatal hernia.  Marland Kitchen Headache(784.0)    migraine hx of  . History of blood transfusion 04/13/2015   . History of echocardiogram    a. Echo 6/17: EF 60-65%, normal wall motion, mild MR, atrial septal lipomatous hypertrophy, PASP 34 mmHg,  possible trivial free-flowing pericardial effusion along RV free wall // b. Echo 5/17: Mild LVH, EF 55-60%, normal wall motion, grade 1 diastolic dysfunction, trivial MR, severe LAE, mild RAE, PASP 42 mmHg  . History of nephrostomy 04/11/2015   currently inplace 04/28/2015  removed now  . History of nuclear stress test    a. Myoview 1/14 - Marked ischemia in the basal anterior, mid anterior, apical septal and apical inferior regions, EF 63% >> LHC normal   . Hyperlipidemia   . Hypertension    medication removed from regimen due to low blood pressure   . Iron deficiency    hx  . Nephrolithiasis 2002, 2006   bilateral  . Normal coronary arteries 2014   a. LHC in 1/14: normal coronary arteries  . PAF (paroxysmal atrial fibrillation) ([place])    a. 10/ 2013  in setting of Septic Shock //  b. recurrent during admit for pneumonia, L effusion >> placed on Amiodarone // Coumadin for anticoagulation  . Pneumonia jan 2018, last tme lungs clear now   dx 10-06-2014 per CXR--  on 10-27-2014 pt states finished antibiotic and denies cough or fever  . S/P hemodialysis catheter insertion ([place]) 04/11/2015    right anterior chest , only used once   . Sigmoid diverticulosis   . UTI (urinary tract infection) 05/10/2016    Patient Active Problem  List   Diagnosis Date Noted  . CAP (community acquired pneumonia) 07/02/2016  . Dehydration   . PNA (pneumonia) 05/10/2016  . On amiodarone therapy 11/30/2015  . Pleural effusion on left   . Pleurisy   . Hypoxemia   . HCAP (healthcare-associated pneumonia) 11/21/2015  . S/P thoracentesis   . Chest pain 11/20/2015  . Pleural effusion, left 11/20/2015  . Pain in the chest   . Pericardial effusion   . Pleural effusion   . Encounter for therapeutic drug monitoring 11/13/2015  . Atrial fibrillation with RVR ([place])   . PAF (paroxysmal atrial fibrillation) ([place]) 11/05/2015  . Urinary tract infectious disease 10/31/2015  . Pyelonephritis 09/25/2015  . Sepsis ([place])  09/25/2015  . Anemia of renal disease 08/14/2015  . Severe protein-calorie malnutrition ([place]) 05/27/2015  . End stage renal disease ([place])   . Normocytic anemia 11/23/2014  . Essential hypertension, benign 08/14/2013  . Glaucoma     Past Surgical History:  Procedure Laterality Date  . AV FISTULA PLACEMENT Left 06/02/2015   Procedure: BRACHIOCEPHALIC ARTERIOVENOUS (AV) FISTULA CREATION ;  Surgeon: Conrad Diehlstadt, MD;  Location: [facility];  Service: Vascular;  Laterality: Left;  . BASCILIC VEIN TRANSPOSITION Left 07/27/2015   Procedure: FIRST STAGE BASILIC VEIN TRANSPOSITION LEFT UPPER ARM;  Surgeon: Conrad Treynor, MD;  Location: [facility];  Service: Vascular;  Laterality: Left;  . BASCILIC VEIN TRANSPOSITION Left 09/2015   second phase  . BASCILIC VEIN TRANSPOSITION Left 10/12/2015   Procedure: SECOND STAGE BASILIC VEIN TRANSPOSITION LEFT ARM;  Surgeon: Conrad Nyssa, MD;  Location: [facility];  Service: Vascular;  Laterality: Left;  . BREAST BIOPSY Left 08/23/07   benign fibrocystic with duct ectasia  . CARDIAC CATHETERIZATION  07-11-2012  dr Irish Lack   Abnormal stress test/   normal coronary arteries/  LVEDP  21mmHg  . CARDIOVASCULAR STRESS TEST  06-26-2012  dr Irish Lack   marked ischemia in the basal anterior, mid anterior, apical inferior regions/  normal LVF, ef 63%  . CATARACT EXTRACTION W/ INTRAOCULAR LENS  IMPLANT, BILATERAL    . COLONOSCOPY WITH PROPOFOL N/A 10/17/2016   Procedure: COLONOSCOPY WITH PROPOFOL;  Surgeon: Garlan Fair, MD;  Location: [facility];  Service: Endoscopy;  Laterality: N/A;  . CYSTOSCOPY W/ URETERAL STENT PLACEMENT  04/04/2012   Procedure: CYSTOSCOPY WITH RETROGRADE PYELOGRAM/URETERAL STENT PLACEMENT;  Surgeon: Ailene Rud, MD;  Location: [facility];  Service: Urology;  Laterality: Left;  . CYSTOSCOPY W/ URETERAL STENT PLACEMENT Bilateral 05/04/2015   Procedure: CYSTOSCOPY WITH BILATERAL RETROGRADE PYELOGRAM/ WITH INTERPRETATION, EXCHANGE OF RIGHT URETERAL STENT  REPLACEMENT AND PLACEMENT LEFT URETERAL STENT PLACEMENT EXAMINATION OF VAGINA;  Surgeon: Carolan Clines, MD;  Location: [facility];  Service: Urology;  Laterality: Bilateral;  . CYSTOSCOPY WITH STENT PLACEMENT Right 10/28/2014   Procedure: RIGHT URETERAL STENT PLACEMENT;  Surgeon: Irine Seal, MD;  Location: [facility];  Service: Urology;  Laterality: Right;  . CYSTOSCOPY WITH STENT PLACEMENT Right 02/26/2015   Procedure: CYSTOSCOPY RETROGRADE PYELOGRAM WITH STENT PLACEMENT;  Surgeon: Cleon Gustin, MD;  Location: [facility];  Service: Urology;  Laterality: Right;  . CYSTOSCOPY/RETROGRADE/URETEROSCOPY/STONE EXTRACTION WITH BASKET Right 11/21/2014   Procedure: CYSTOSCOPY/RIGHT RETROGRADE PYELOGRAM/RIGHT URETEROSCOPY/BASKET EXTRACTION/RIGHT PYELOSCOPY/LASER OF STONE/RIGHT DOUBLE J STENT;  Surgeon: Carolan Clines, MD;  Location: [facility];  Service: Urology;  Laterality: Right;  . EXTRACORPOREAL SHOCK WAVE LITHOTRIPSY  05-28-2012  &  10-08-2012  . HOLMIUM LASER APPLICATION Right 06/23/9700   Procedure: HOLMIUM LASER APPLICATION;  Surgeon:  Carolan Clines, MD;  [facility];  Service: Urology;  Laterality: Right;  . IR GENERIC HISTORICAL  02/01/2016   IR NEPHROSTOMY EXCHANGE RIGHT 02/01/2016 Greggory Keen, MD [facility]  . IR GENERIC HISTORICAL  02/24/2016   IR PATIENT EVAL TECH 0-60 MINS 02/24/2016 Aletta Edouard, MD [facility]  . KNEE ARTHROSCOPY Left 02-14-2003  . LAPAROSCOPIC CHOLECYSTECTOMY  03-23-2005  . TOTAL ABDOMINAL HYSTERECTOMY W/ BILATERAL SALPINGOOPHORECTOMY  1993   secondary to fibroids  . TRANSTHORACIC ECHOCARDIOGRAM  04-09-2012   normal LVF,  ef 60-65%,  mild LAE,  mild TR, trivial MR and PR     OB History    Gravida  1   Para  1   Term  1   Preterm  0   AB  0   Living  1     SAB  0   TAB  0   Ectopic  0   Multiple  0   Live Births  1            Home Medications    Prior to Admission medications    Medication Sig Start Date End Date Taking? Authorizing Provider  acetaminophen (TYLENOL) 500 MG tablet Take 1,000 mg by mouth every 6 (six) hours as needed for moderate pain or headache.    Yes [provider]  amiodarone (PACERONE) 200 MG tablet Take 1 tablet (200 mg total) by mouth daily. 03/07/17  Yes Jettie Booze, MD  bimatoprost (LUMIGAN) 0.01 % SOLN Place 1 drop into both eyes at bedtime.   Yes [provider]  calcium acetate (PHOSLO) 667 MG capsule Take 1,334 mg by mouth 3 (three) times daily with meals.    Yes [provider]  cephALEXin (KEFLEX) 250 MG capsule Take 250 mg by mouth daily.   Yes [provider]  Multiple Vitamins-Minerals (MULTIVITAMIN WITH MINERALS) tablet Take 1 tablet by mouth daily.   Yes [provider]  ranitidine (ZANTAC) 150 MG tablet Take 150 mg by mouth 2 (two) times daily as needed for heartburn.   Yes [provider]  saccharomyces boulardii (FLORASTOR) 250 MG capsule Take 1 capsule (250 mg total) by mouth 2 (two) times daily. 07/08/15  Yes Donne Hazel, MD  sodium chloride (OCEAN) 0.65 % SOLN nasal spray Place 1 spray into both nostrils 3 (three) times daily as needed for congestion.   Yes [provider]  timolol (BETIMOL) 0.5 % ophthalmic solution Place 1 drop into both eyes every morning.    Yes [provider]  warfarin (COUMADIN) 5 MG tablet TAKE AS DIRECTED BY COUMADIN CLINIC. Patient taking differently: Take 5 mg by mouth daily. 5 mg on Monday, Wednesday and Friday. All other days take 2.5 mg 03/07/17  Yes Jettie Booze, MD    Family History Family History  Problem Relation Age of Onset  . Hypertension Mother   . Cancer Mother 15       breast  . Dementia Mother   . Hypertension Brother   . Diabetes Brother   . Heart disease Brother        before age 20  . Cancer Father 14       pancreatic  . Heart failure Paternal Grandmother   . Bladder Cancer Maternal  Grandfather     Social History Social History   Tobacco Use  . Smoking status: Never Smoker  . Smokeless tobacco: Never Used  Substance Use Topics  . Alcohol use: No  Alcohol/week: 0.0 oz  . Drug use: No     Allergies   Vicodin [hydrocodone-acetaminophen]; Chlorhexidine; and Percocet [oxycodone-acetaminophen]   Review of Systems Review of Systems  Constitutional: Positive for fatigue. Negative for chills and fever.  HENT: Negative for congestion, rhinorrhea and sore throat.   Eyes: Negative for visual disturbance.  Respiratory: Negative for cough, shortness of breath and wheezing.   Cardiovascular: Negative for chest pain and leg swelling.  Gastrointestinal: Negative for abdominal pain, diarrhea, nausea and vomiting.  Genitourinary: Positive for flank pain and hematuria. Negative for dysuria, vaginal bleeding and vaginal discharge.  Musculoskeletal: Negative for neck pain.  Skin: Negative for rash.  Allergic/Immunologic: Negative for immunocompromised state.  Neurological: Negative for syncope and headaches.  Hematological: Does not bruise/bleed easily.  All other systems reviewed and are negative.    Physical Exam Updated Vital Signs BP (!) 137/59   Pulse 73   Temp 99.3 F (37.4 C) (Rectal)   Resp 16   LMP 01/19/1992 (Approximate)   SpO2 97%   Physical Exam  Constitutional: She is oriented to person, place, and time. She appears well-developed and well-nourished. No distress.  HENT:  Head: Normocephalic and atraumatic.  Eyes: Conjunctivae are normal.  Neck: Neck supple.  Cardiovascular: Normal rate, regular rhythm and normal heart sounds. Exam reveals no friction rub.  No murmur heard. Pulmonary/Chest: Effort normal and breath sounds normal. No respiratory distress. She has no wheezes. She has no rales.  Abdominal: Soft. She exhibits no distension.  Mild right CVA tenderness.  Abdomen otherwise soft and nontender without rebound or guarding.    Musculoskeletal: She exhibits no edema.  Neurological: She is alert and oriented to person, place, and time. She exhibits normal muscle tone.  Skin: Skin is warm. Capillary refill takes less than 2 seconds.  Psychiatric: She has a normal mood and affect.  Nursing note and vitals reviewed.    ED Treatments / Results  Labs (all labs ordered are listed, but only abnormal results are displayed) Labs Reviewed  BASIC METABOLIC PANEL - Abnormal; Notable for the following components:      Result Value   Chloride 98 (*)    Glucose, Bld 118 (*)    BUN 30 (*)    Creatinine, Ser 5.73 (*)    Calcium 8.7 (*)    GFR calc non Af Amer 7 (*)    GFR calc Af Amer 8 (*)    All other components within normal limits  CBC WITH DIFFERENTIAL/PLATELET - Abnormal; Notable for the following components:   WBC 16.1 (*)    RBC 3.72 (*)    Hemoglobin 11.3 (*)    HCT 35.2 (*)    Neutro Abs 14.7 (*)    Lymphs Abs 0.4 (*)    All other components within normal limits  URINALYSIS, ROUTINE W REFLEX MICROSCOPIC - Abnormal; Notable for the following components:   Color, Urine RED (*)    APPearance TURBID (*)    Glucose, UA 250 (*)    Hgb urine dipstick LARGE (*)    Bilirubin Urine SMALL (*)    Ketones, ur 15 (*)    Protein, ur >300 (*)    Nitrite POSITIVE (*)    Leukocytes, UA LARGE (*)    All other components within normal limits  PROTIME-INR - Abnormal; Notable for the following components:   Prothrombin Time 26.5 (*)    All other components within normal limits  URINALYSIS, MICROSCOPIC (REFLEX) - Abnormal; Notable for the following components:   Bacteria,  UA FEW (*)    All other components within normal limits  CULTURE, BLOOD (ROUTINE X 2)  CULTURE, BLOOD (ROUTINE X 2)  URINE CULTURE  I-STAT CG4 LACTIC ACID, ED  I-STAT CG4 LACTIC ACID, ED    EKG None  Radiology Ct Renal Stone Study  Result Date: 11/24/2017 CLINICAL DATA:  Hematuria with right lower back pain beginning this afternoon. EXAM: CT  ABDOMEN AND PELVIS WITHOUT CONTRAST TECHNIQUE: Multidetector CT imaging of the abdomen and pelvis was performed following the standard protocol without IV contrast. COMPARISON:  05/02/2015 FINDINGS: Lower chest: Mild left basilar atelectasis. Moderate size hiatal hernia. Moderate calcified plaque over the left main and lateral circumflex coronary arteries. Hepatobiliary: Previous cholecystectomy. The liver and biliary tree are normal. Pancreas: Normal. Spleen: Normal. Adrenals/Urinary Tract: Adrenal glands are normal. Atrophic left kidney with a few punctate calcifications unchanged. Subtle chronic prominence of the left intrarenal collecting system and ureter. No left ureteral stones. There is mild right-sided hydronephrosis with moderate right perinephric inflammation/fluid. Single punctate stone over the lower pole right kidney. Right ureter is dilated proximally. Possible faint punctate stone over the distal right ureter although this is not occlusive. Bladder is collapsed. Stomach/Bowel: Moderate size hiatal hernia. Small bowel is within normal. Appendix is normal. There is diverticulosis of the colon most prominent over the sigmoid colon without active inflammation. Vascular/Lymphatic: Slight increase in size of a periaortic lymph node measuring 1.4 cm by long axis. Small retrocaval lymph node unchanged. Vascular structures are unremarkable. Reproductive: Previous hysterectomy. Other: Small umbilical hernia containing only mesenteric fat. Diastases of the rectus abdominus muscles over the lower abdomen/pelvis with associated herniation of peritoneal fat unchanged. Musculoskeletal: Degenerative changes of the spine without significant change. IMPRESSION: Bilateral nephrolithiasis. Mild right-sided hydronephrosis with moderate right perinephric inflammation/fluid. Possible punctate distal right ureteral stone, although not conclusive. Findings may be due to recent passage of a stone versus infection. Colonic  diverticulosis. Umbilical hernia and midline lower abdominal/pelvic ventral hernia containing only peritoneal fat unchanged. Moderate size hiatal hernia. Atherosclerotic coronary artery disease. Electronically Signed   By: Marin Olp M.D.   On: 11/24/2017 22:23    Procedures Procedures (including critical care time)  Medications Ordered in ED Medications  cefTRIAXone (ROCEPHIN) 2 g in sodium chloride 0.9 % 100 mL IVPB (has no administration in time range)  sodium chloride 0.9 % bolus 250 mL (has no administration in time range)  morphine 4 MG/ML injection 4 mg (4 mg Intravenous Given 11/24/17 2129)  ondansetron (ZOFRAN) injection 4 mg (4 mg Intravenous Given 11/24/17 2129)  HYDROmorphone (DILAUDID) injection 0.5 mg (0.5 mg Intravenous Given 11/25/17 0232)  ondansetron (ZOFRAN) injection 4 mg (4 mg Intravenous Given 11/25/17 0232)     Initial Impression / Assessment and Plan / ED Course  I have reviewed the triage vital signs and the nursing notes.  Pertinent labs & imaging results that were available during my care of the patient were reviewed by me and considered in my medical decision making (see chart for details).     72 year old female with complicated past medical history as above here with right flank pain.  Patient also with reported hematuria.  CT imaging shows right-sided stranding concerning for possible recently passed stone versus infection.  Patient has a borderline fever here with significant leukocytosis and pyuria with positive nitrites.  Concern for pyelonephritis.  Given her multiple comorbidities, will start on IV Rocephin and plan for admission.  Final Clinical Impressions(s) / ED Diagnoses   Final diagnoses:  Pyelonephritis  ED Discharge Orders    None       Duffy Bruce, MD 11/25/17 [phone]

## 2017-11-25 NOTE — Progress Notes (Signed)
Signed orders are locked by pharmacy, will release when this changes.

## 2017-11-25 NOTE — Progress Notes (Signed)
Paged MD regarding admission orders and pt arrival to unit

## 2017-11-25 NOTE — Progress Notes (Signed)
HD orders modified from 11-25-2017 to 11-26-2017  Per DR Jonnie Finner.  Primary RN, Caryl Pina notied of above

## 2017-11-26 DIAGNOSIS — N12 Tubulo-interstitial nephritis, not specified as acute or chronic: Secondary | ICD-10-CM | POA: Diagnosis not present

## 2017-11-26 DIAGNOSIS — N10 Acute tubulo-interstitial nephritis: Secondary | ICD-10-CM | POA: Diagnosis not present

## 2017-11-26 DIAGNOSIS — R310 Gross hematuria: Secondary | ICD-10-CM | POA: Diagnosis not present

## 2017-11-26 DIAGNOSIS — D72829 Elevated white blood cell count, unspecified: Secondary | ICD-10-CM | POA: Diagnosis not present

## 2017-11-26 DIAGNOSIS — N132 Hydronephrosis with renal and ureteral calculous obstruction: Secondary | ICD-10-CM | POA: Diagnosis not present

## 2017-11-26 LAB — CBC
HCT: 31.3 % — ABNORMAL LOW (ref 36.0–46.0)
Hemoglobin: 9.9 g/dL — ABNORMAL LOW (ref 12.0–15.0)
MCH: 30.3 pg (ref 26.0–34.0)
MCHC: 31.6 g/dL (ref 30.0–36.0)
MCV: 95.7 fL (ref 78.0–100.0)
Platelets: 174 10*3/uL (ref 150–400)
RBC: 3.27 MIL/uL — ABNORMAL LOW (ref 3.87–5.11)
RDW: 15.3 % (ref 11.5–15.5)
WBC: 11.4 10*3/uL — ABNORMAL HIGH (ref 4.0–10.5)

## 2017-11-26 LAB — URINE CULTURE: Special Requests: NORMAL

## 2017-11-26 LAB — PROTIME-INR
INR: 2.3
Prothrombin Time: 25.1 seconds — ABNORMAL HIGH (ref 11.4–15.2)

## 2017-11-26 MED ORDER — HEPARIN SODIUM (PORCINE) 1000 UNIT/ML DIALYSIS: 20.0000 [IU]/kg | INTRAMUSCULAR | Status: DC | PRN

## 2017-11-26 MED ORDER — LIDOCAINE-PRILOCAINE 2.5-2.5 % EX CREA: 1.0000 "application " | TOPICAL_CREAM | CUTANEOUS | Status: DC | PRN

## 2017-11-26 MED ORDER — WARFARIN SODIUM 2.5 MG PO TABS
2.5000 mg | ORAL_TABLET | Freq: Once | ORAL | Status: AC
  Administered 2017-11-26: 2.5 mg via ORAL
  Filled 2017-11-26: qty 1

## 2017-11-26 MED ORDER — PENTAFLUOROPROP-TETRAFLUOROETH EX AERO: 1.0000 "application " | INHALATION_SPRAY | CUTANEOUS | Status: DC | PRN

## 2017-11-26 MED ORDER — SODIUM CHLORIDE 0.9 % IV SOLN: 100.0000 mL | INTRAVENOUS | Status: DC | PRN

## 2017-11-26 MED ORDER — LIDOCAINE HCL (PF) 1 % IJ SOLN: 5.0000 mL | INTRAMUSCULAR | Status: DC | PRN

## 2017-11-26 MED ORDER — HEPARIN SODIUM (PORCINE) 1000 UNIT/ML DIALYSIS: 1000.0000 [IU] | INTRAMUSCULAR | Status: DC | PRN

## 2017-11-26 MED ORDER — ACETAMINOPHEN 500 MG PO TABS
1000.0000 mg | ORAL_TABLET | Freq: Three times a day (TID) | ORAL | Status: DC | PRN
  Administered 2017-11-27: 1000 mg via ORAL
  Filled 2017-11-26: qty 2

## 2017-11-26 NOTE — Progress Notes (Signed)
Patient resting comfortably during shift report. Denies complaints.  

## 2017-11-26 NOTE — Progress Notes (Signed)
[facility] Progress Note  Subjective: brief R flank pain episode yesterday  Vitals:   11/25/17 2014 11/25/17 2343 11/26/17 0320 11/26/17 0507  BP: (!) 132/51 (!) 153/57  136/61  Pulse: 75 82  75  Resp: 18 18  18   Temp: 98.2 F (36.8 C) 97.8 F (36.6 C)  (!) 97.2 F (36.2 C)  TempSrc: Oral Oral  Oral  SpO2: 93% 93%  92%  Weight:   90.1 kg (198 lb 11.2 oz)   Height:        Inpatient medications: . amiodarone  200 mg Oral Daily  . calcitRIOL  0.25 mcg Oral Q T,Th,Sa-HD  . calcium acetate  1,334 mg Oral TID WC  . Chlorhexidine Gluconate Cloth  6 each Topical Q0600  . feeding supplement (NEPRO CARB STEADY)  237 mL Oral BID BM  . latanoprost  1 drop Both Eyes QHS  . multivitamin with minerals  1 tablet Oral Daily  . mupirocin ointment  1 application Nasal BID  . saccharomyces boulardii  250 mg Oral BID  . timolol  1 drop Both Eyes q morning - 10a  . Warfarin - Pharmacist Dosing Inpatient   Does not apply q1800   . cefTRIAXone (ROCEPHIN)  IV Stopped (11/26/17 0728)   acetaminophen, HYDROmorphone (DILAUDID) injection, ondansetron (ZOFRAN) IV, sodium chloride  Exam: General: WDWN obese female  Head: NCAT sclera not icteric MMM Neck: Supple. No JVD No masses Lungs: CTA bilaterally without wheezes, rales, or rhonchi. Breathing is unlabored. Heart: RRR with S1 S2 Abdomen: soft NT + BS Lower extremities:without edema or ischemic changes, no open wounds  Neuro: A & O  X 3. Moves all extremities spontaneously. Psych:  Pleasant. Normal affect  Dialysis Access: L forearm AVF +bruit     Dialysis:  [facility] TTS 4h 180NRe 400/800 EDW 89kg 2K/2Ca -L AVF No heparin -Calcitriol 0.49mcg TIW  Assessment: 1. R pyelonephritis/ R hydro/ possible distal R ureteral stone - on IV abx, blood cx's pending and urine cx's mult species. Urology following w/ no surg intervention for now as long as improving.  2. ESRD - TTS HD.  No HD yest d/t staffing, plan HD this afternoon/  evening.  3. Hypertension/volume  - BP controlled vol ok. No excess on exam under dry.  4. Anemia  - Hgb 11.3. No ESA needs currently  5. Metabolic bone disease -  Continue Calcitriol/binders  6. Nutrition - Renal diet vitamins 7. P AFib -Amiodarone/Coumadin per pharmacy    Plan - as above   Kelly Splinter MD [place] pager [phone]   11/26/2017, 10:52 AM   Recent Labs  Lab 11/24/17 2108  NA 137  K 4.0  CL 98*  CO2 27  GLUCOSE 118*  BUN 30*  CREATININE 5.73*  CALCIUM 8.7*   No results for input(s): AST, ALT, ALKPHOS, BILITOT, PROT, ALBUMIN in the last 168 hours. Recent Labs  Lab 11/24/17 2108 11/26/17 0742  WBC 16.1* 11.4*  NEUTROABS 14.7*  --   HGB 11.3* 9.9*  HCT 35.2* 31.3*  MCV 94.6 95.7  PLT 208 174   Iron/TIBC/Ferritin/ %Sat    Component Value Date/Time   IRON 41 10/29/2015 1000   TIBC 175 (L) 10/29/2015 1000   FERRITIN 312 (H) 10/29/2015 1000   IRONPCTSAT 23 10/29/2015 1000

## 2017-11-26 NOTE — Progress Notes (Signed)
ANTICOAGULATION CONSULT NOTE - Initial Consult  Pharmacy Consult for warfarin  Indication: atrial fibrillation  Allergies  Allergen Reactions  . Vicodin [Hydrocodone-Acetaminophen] Nausea And Vomiting  . Chlorhexidine Rash    Sunburn    rash  . Percocet [Oxycodone-Acetaminophen] Nausea And Vomiting    Patient Measurements: Height: 5\' 3"  (160 cm) Weight: 198 lb 11.2 oz (90.1 kg) IBW/kg (Calculated) : 52.4   Vital Signs: Temp: 97.2 F (36.2 C) (06/09 0507) Temp Source: Oral (06/09 0507) BP: 136/61 (06/09 0507) Pulse Rate: 75 (06/09 0507)  Labs: Recent Labs    11/24/17 2108 11/25/17 0233 11/26/17 0742  HGB 11.3*  --  9.9*  HCT 35.2*  --  31.3*  PLT 208  --  174  LABPROT  --  26.5* 25.1*  INR  --  2.46 2.30  CREATININE 5.73*  --   --     Estimated Creatinine Clearance: 9.3 mL/min (A) (by C-G formula based on SCr of 5.73 mg/dL (H)).   Assessment: Pharmacy consulted to resume PTA warfarin for Afib. Patient reported last dose taken on 6/7 evening. Baseline INR this admission is 2.30. Noted that patient here with UTI with hematuria, now resolving. Will continue to monitor closely for s/sx of bleeding while on warfarin here. Hemoglobin down to 9.9, platelets wnl.   Goal of Therapy:  INR 2-3 Monitor platelets by anticoagulation protocol: Yes   Plan:  Warfarin 2.5mg  PO x 1 tonight (per PTA dosing) INR/CBC daily  Monitor for s/sx of bleeding   Jalene Mullet, Pharm.D. PGY1 Pharmacy Resident 11/26/2017 11:37 AM Phone: [phone]

## 2017-11-26 NOTE — Progress Notes (Signed)
Subjective: The patient had a short episode of right-sided flank pain last night that quickly resolved with a dose of Dilaudid.  She denies any flank discomfort this morning.  Hematuria resolved.  Denies nausea/vomiting or chills.  She has remained afebrile over the last shift.  WBC improving  Objective: Vital signs in last 24 hours: Temp:  [97.2 F (36.2 C)-98.2 F (36.8 C)] 97.2 F (36.2 C) (06/09 0507) Pulse Rate:  [73-82] 75 (06/09 0507) Resp:  [18] 18 (06/09 0507) BP: (132-153)/(51-76) 136/61 (06/09 0507) SpO2:  [92 %-93 %] 92 % (06/09 0507) Weight:  [90.1 kg (198 lb 11.2 oz)] 90.1 kg (198 lb 11.2 oz) (06/09 0320)  Intake/Output from previous day: 06/08 0701 - 06/09 0700 In: 1040 [P.O.:940; IV Piggyback:100] Out: 600 [Urine:600]  Intake/Output this shift: No intake/output data recorded.  Physical Exam:  General: Alert and oriented CV: RRR, palpable distal pulses Lungs: CTAB, equal chest rise Abdomen: Soft, NTND, no rebound or guarding Gu: No CVAT Ext: NT, No erythema  Lab Results: Recent Labs    11/24/17 2108 11/26/17 0742  HGB 11.3* 9.9*  HCT 35.2* 31.3*   BMET Recent Labs    11/24/17 2108  NA 137  K 4.0  CL 98*  CO2 27  GLUCOSE 118*  BUN 30*  CREATININE 5.73*  CALCIUM 8.7*     Studies/Results: Ct Renal Stone Study  Result Date: 11/24/2017 CLINICAL DATA:  Hematuria with right lower back pain beginning this afternoon. EXAM: CT ABDOMEN AND PELVIS WITHOUT CONTRAST TECHNIQUE: Multidetector CT imaging of the abdomen and pelvis was performed following the standard protocol without IV contrast. COMPARISON:  05/02/2015 FINDINGS: Lower chest: Mild left basilar atelectasis. Moderate size hiatal hernia. Moderate calcified plaque over the left main and lateral circumflex coronary arteries. Hepatobiliary: Previous cholecystectomy. The liver and biliary tree are normal. Pancreas: Normal. Spleen: Normal. Adrenals/Urinary Tract: Adrenal glands are normal. Atrophic  left kidney with a few punctate calcifications unchanged. Subtle chronic prominence of the left intrarenal collecting system and ureter. No left ureteral stones. There is mild right-sided hydronephrosis with moderate right perinephric inflammation/fluid. Single punctate stone over the lower pole right kidney. Right ureter is dilated proximally. Possible faint punctate stone over the distal right ureter although this is not occlusive. Bladder is collapsed. Stomach/Bowel: Moderate size hiatal hernia. Small bowel is within normal. Appendix is normal. There is diverticulosis of the colon most prominent over the sigmoid colon without active inflammation. Vascular/Lymphatic: Slight increase in size of a periaortic lymph node measuring 1.4 cm by long axis. Small retrocaval lymph node unchanged. Vascular structures are unremarkable. Reproductive: Previous hysterectomy. Other: Small umbilical hernia containing only mesenteric fat. Diastases of the rectus abdominus muscles over the lower abdomen/pelvis with associated herniation of peritoneal fat unchanged. Musculoskeletal: Degenerative changes of the spine without significant change. IMPRESSION: Bilateral nephrolithiasis. Mild right-sided hydronephrosis with moderate right perinephric inflammation/fluid. Possible punctate distal right ureteral stone, although not conclusive. Findings may be due to recent passage of a stone versus infection. Colonic diverticulosis. Umbilical hernia and midline lower abdominal/pelvic ventral hernia containing only peritoneal fat unchanged. Moderate size hiatal hernia. Atherosclerotic coronary artery disease. Electronically Signed   By: Marin Olp M.D.   On: 11/24/2017 22:23    Assessment/Plan: Acute right pyelonephritis-improving Possible punctate right distal ureteral stone  Right hydronephrosis ESRD  -Her urine culture had mixed growth and her blood cultures are pending.  Currently improving clinically on IV Rocephin.  Recommend  a total of 2 weeks of antibiotic coverage.  She was previously  taking Keflex once daily for recurrent UTI prophylaxis. -Again, in the event that she has a kidney stone, the calcification in question is measuring only 1 to 2 mm in greatest dimension and will likely pass without surgical intervention.  Will arrange outpatient follow-up in 1 to 2 weeks. -Hemodialysis today    LOS: 1 day   Ellison Hughs, MD [facility] Pager: [phone]  11/26/2017, 9:24 AM

## 2017-11-26 NOTE — Progress Notes (Signed)
PROGRESS NOTE    [name]  GNF:[id number] DOB: Apr 25, 1946 DOA: 11/24/2017 PCP: Lajean Manes, MD   Brief Narrative:  History of end-stage renal disease on dialysis Tuesday Thursday Saturday, history of PAF on Coumadin and amiodarone, history of bilateral kidney stone, history of right-sided nephrostomy tube removed 2 years ago, presented to the emergency room due to complaint of right-sided flank pain and transient gross hematuria. She has associated nausea, no fever.  She states she has not had UTI in the last 2 years until now, since her urologist started her on a daily Keflex prophylaxis.  Assessment & Plan:   Active Problems:   Pyelonephritis   Pyelonephritis, with mild right-sided hydronephrosis, possible punctate distal right ureteral stone -currently Pain-free, hemodynamically stable -Blood culture pending - urine cx with multiple species -continue Rocephin 2 g daily -Case discussed with urology Dr. Lovena Neighbours - recommending 2 weeks abx, they'll arrange outpatient follow up  ESRD on HD MWF -Management per nephrology  PAF: Currently in sinus rhythm continue amiodarone Coumadin per pharmacy  Moderate size hiatal hernia -Currently denies symptom  Leukocytosis: 2/2 above, improved  Anemia: follow, downtrending, likely dilutional  DVT prophylaxis: warfarin  Code Status: full  Family Communication: friends and family at bedside Disposition Plan: pending improvement   Consultants:   Urology  nephrology  Procedures:   none  Antimicrobials:  Anti-infectives (From admission, onward)   Start     Dose/Rate Route Frequency Ordered Stop   11/26/17 0600  cefTRIAXone (ROCEPHIN) 2 g in sodium chloride 0.9 % 100 mL IVPB     2 g 200 mL/hr over 30 Minutes Intravenous Every 24 hours 11/25/17 0855     11/25/17 0415  cefTRIAXone (ROCEPHIN) 2 g in sodium chloride 0.9 % 100 mL IVPB     2 g 200 mL/hr over 30 Minutes Intravenous  Once 11/25/17 0408 11/25/17 0520      Subjective: Friday started passing blood in urine, then had right sided back pain. Doing ok now.  Objective: Vitals:   11/25/17 2343 11/26/17 0320 11/26/17 0507 11/26/17 1403  BP: (!) 153/57  136/61 (!) 148/59  Pulse: 82  75 72  Resp: 18  18 20   Temp: 97.8 F (36.6 C)  (!) 97.2 F (36.2 C) 98 F (36.7 C)  TempSrc: Oral  Oral Oral  SpO2: 93%  92% 93%  Weight:  90.1 kg (198 lb 11.2 oz)    Height:        Intake/Output Summary (Last 24 hours) at 11/26/2017 1819 Last data filed at 11/26/2017 1800 Gross per 24 hour  Intake 560 ml  Output 600 ml  Net -40 ml   Filed Weights   11/25/17 0827 11/26/17 0320  Weight: 89.4 kg (197 lb 1.6 oz) 90.1 kg (198 lb 11.2 oz)    Examination:  General exam: Appears calm and comfortable  Respiratory system: Clear to auscultation. Respiratory effort normal. Cardiovascular system: S1 & S2 heard, RRR. No JVD, murmurs, rubs, gallops or clicks. No pedal edema. Gastrointestinal system: Abdomen is nondistended, soft and nontender. No organomegaly or masses felt. Normal bowel sounds heard. No CVA tenderness Central nervous system: Alert and oriented. No focal neurological deficits. Extremities: Symmetric 5 x 5 power. Skin: No rashes, lesions or ulcers Psychiatry: Judgement and insight appear normal. Mood & affect appropriate.     Data Reviewed: I have personally reviewed following labs and imaging studies  CBC: Recent Labs  Lab 11/24/17 2108 11/26/17 0742  WBC 16.1* 11.4*  NEUTROABS 14.7*  --  HGB 11.3* 9.9*  HCT 35.2* 31.3*  MCV 94.6 95.7  PLT 208 761   Basic Metabolic Panel: Recent Labs  Lab 11/24/17 2108  NA 137  K 4.0  CL 98*  CO2 27  GLUCOSE 118*  BUN 30*  CREATININE 5.73*  CALCIUM 8.7*   GFR: Estimated Creatinine Clearance: 9.3 mL/min (A) (by C-G formula based on SCr of 5.73 mg/dL (H)). Liver Function Tests: No results for input(s): AST, ALT, ALKPHOS, BILITOT, PROT, ALBUMIN in the last 168 hours. No results for  input(s): LIPASE, AMYLASE in the last 168 hours. No results for input(s): AMMONIA in the last 168 hours. Coagulation Profile: Recent Labs  Lab 11/25/17 0233 11/26/17 0742  INR 2.46 2.30   Cardiac Enzymes: No results for input(s): CKTOTAL, CKMB, CKMBINDEX, TROPONINI in the last 168 hours. BNP (last 3 results) No results for input(s): PROBNP in the last 8760 hours. HbA1C: No results for input(s): HGBA1C in the last 72 hours. CBG: No results for input(s): GLUCAP in the last 168 hours. Lipid Profile: No results for input(s): CHOL, HDL, LDLCALC, TRIG, CHOLHDL, LDLDIRECT in the last 72 hours. Thyroid Function Tests: No results for input(s): TSH, T4TOTAL, FREET4, T3FREE, THYROIDAB in the last 72 hours. Anemia Panel: No results for input(s): VITAMINB12, FOLATE, FERRITIN, TIBC, IRON, RETICCTPCT in the last 72 hours. Sepsis Labs: Recent Labs  Lab 11/25/17 0241 11/25/17 0443  LATICACIDVEN 1.56 1.28    Recent Results (from the past 240 hour(s))  Blood culture (routine x 2)     Status: None (Preliminary result)   Collection Time: 11/25/17  4:27 AM  Result Value Ref Range Status   Specimen Description BLOOD RIGHT HAND  Final   Special Requests   Final    BOTTLES DRAWN AEROBIC AND ANAEROBIC Blood Culture adequate volume   Culture   Final    NO GROWTH 1 DAY Performed at [facility], [place] [address] [place], [place] [address]    Report Status PENDING  Incomplete  Blood culture (routine x 2)     Status: None (Preliminary result)   Collection Time: 11/25/17  4:30 AM  Result Value Ref Range Status   Specimen Description BLOOD RIGHT LOWER ARM  Final   Special Requests   Final    BOTTLES DRAWN AEROBIC AND ANAEROBIC Blood Culture adequate volume   Culture   Final    NO GROWTH 1 DAY Performed at [facility], [place] [address], [place], [place] [address]    Report Status PENDING  Incomplete  Urine culture     Status: Abnormal   Collection Time: 11/25/17  7:48 AM  Result  Value Ref Range Status   Specimen Description URINE, CATHETERIZED  Final   Special Requests   Final    Normal Performed at [facility], [place] [address], [place], [place] [address]    Culture MULTIPLE SPECIES PRESENT, SUGGEST RECOLLECTION (A)  Final   Report Status 11/26/2017 FINAL  Final  MRSA PCR Screening     Status: Abnormal   Collection Time: 11/25/17  8:46 AM  Result Value Ref Range Status   MRSA by PCR POSITIVE (A) NEGATIVE Final    Comment:        The GeneXpert MRSA Assay (FDA approved for NASAL specimens only), is one component of a comprehensive MRSA colonization surveillance program. It is not intended to diagnose MRSA infection nor to guide or monitor treatment for MRSA infections. RESULT CALLED TO, READ BACK BY AND VERIFIED WITH: A. TOBIAS,  RN AT 1207 ON 11/25/17 BY Colin Rhein, MLT          Radiology Studies: Ct Renal Stone Study  Result Date: 11/24/2017 CLINICAL DATA:  Hematuria with right lower back pain beginning this afternoon. EXAM: CT ABDOMEN AND PELVIS WITHOUT CONTRAST TECHNIQUE: Multidetector CT imaging of the abdomen and pelvis was performed following the standard protocol without IV contrast. COMPARISON:  05/02/2015 FINDINGS: Lower chest: Mild left basilar atelectasis. Moderate size hiatal hernia. Moderate calcified plaque over the left main and lateral circumflex coronary arteries. Hepatobiliary: Previous cholecystectomy. The liver and biliary tree are normal. Pancreas: Normal. Spleen: Normal. Adrenals/Urinary Tract: Adrenal glands are normal. Atrophic left kidney with a few punctate calcifications unchanged. Subtle chronic prominence of the left intrarenal collecting system and ureter. No left ureteral stones. There is mild right-sided hydronephrosis with moderate right perinephric inflammation/fluid. Single punctate stone over the lower pole right kidney. Right ureter is dilated proximally. Possible faint punctate stone over the distal right ureter  although this is not occlusive. Bladder is collapsed. Stomach/Bowel: Moderate size hiatal hernia. Small bowel is within normal. Appendix is normal. There is diverticulosis of the colon most prominent over the sigmoid colon without active inflammation. Vascular/Lymphatic: Slight increase in size of a periaortic lymph node measuring 1.4 cm by long axis. Small retrocaval lymph node unchanged. Vascular structures are unremarkable. Reproductive: Previous hysterectomy. Other: Small umbilical hernia containing only mesenteric fat. Diastases of the rectus abdominus muscles over the lower abdomen/pelvis with associated herniation of peritoneal fat unchanged. Musculoskeletal: Degenerative changes of the spine without significant change. IMPRESSION: Bilateral nephrolithiasis. Mild right-sided hydronephrosis with moderate right perinephric inflammation/fluid. Possible punctate distal right ureteral stone, although not conclusive. Findings may be due to recent passage of a stone versus infection. Colonic diverticulosis. Umbilical hernia and midline lower abdominal/pelvic ventral hernia containing only peritoneal fat unchanged. Moderate size hiatal hernia. Atherosclerotic coronary artery disease. Electronically Signed   By: Marin Olp M.D.   On: 11/24/2017 22:23        Scheduled Meds: . amiodarone  200 mg Oral Daily  . calcitRIOL  0.25 mcg Oral Q T,Th,Sa-HD  . calcium acetate  1,334 mg Oral TID WC  . Chlorhexidine Gluconate Cloth  6 each Topical Q0600  . feeding supplement (NEPRO CARB STEADY)  237 mL Oral BID BM  . latanoprost  1 drop Both Eyes QHS  . multivitamin with minerals  1 tablet Oral Daily  . mupirocin ointment  1 application Nasal BID  . saccharomyces boulardii  250 mg Oral BID  . timolol  1 drop Both Eyes q morning - 10a  . warfarin  2.5 mg Oral ONCE-1800  . Warfarin - Pharmacist Dosing Inpatient   Does not apply q1800   Continuous Infusions: . cefTRIAXone (ROCEPHIN)  IV Stopped (11/26/17 0728)      LOS: 1 day    Time spent: over 30 min    Fayrene Helper, MD [facility] Pager [phone]  If 7PM-7AM, please contact night-coverage [email or web address] Password [facility] 11/26/2017, 6:19 PM

## 2017-11-27 DIAGNOSIS — N12 Tubulo-interstitial nephritis, not specified as acute or chronic: Secondary | ICD-10-CM | POA: Diagnosis not present

## 2017-11-27 LAB — BASIC METABOLIC PANEL
Anion gap: 9 (ref 5–15)
BUN: 19 mg/dL (ref 6–20)
CO2: 31 mmol/L (ref 22–32)
Calcium: 8.3 mg/dL — ABNORMAL LOW (ref 8.9–10.3)
Chloride: 99 mmol/L — ABNORMAL LOW (ref 101–111)
Creatinine, Ser: 4.74 mg/dL — ABNORMAL HIGH (ref 0.44–1.00)
GFR calc Af Amer: 10 mL/min — ABNORMAL LOW (ref >60)
GFR calc non Af Amer: 8 mL/min — ABNORMAL LOW (ref >60)
Glucose, Bld: 80 mg/dL (ref 65–99)
Potassium: 3.7 mmol/L (ref 3.5–5.1)
Sodium: 139 mmol/L (ref 135–145)

## 2017-11-27 LAB — CBC
HCT: 30.7 % — ABNORMAL LOW (ref 36.0–46.0)
Hemoglobin: 9.7 g/dL — ABNORMAL LOW (ref 12.0–15.0)
MCH: 30.4 pg (ref 26.0–34.0)
MCHC: 31.6 g/dL (ref 30.0–36.0)
MCV: 96.2 fL (ref 78.0–100.0)
Platelets: 203 10*3/uL (ref 150–400)
RBC: 3.19 MIL/uL — ABNORMAL LOW (ref 3.87–5.11)
RDW: 15.1 % (ref 11.5–15.5)
WBC: 8.5 10*3/uL (ref 4.0–10.5)

## 2017-11-27 LAB — MAGNESIUM: Magnesium: 2 mg/dL (ref 1.7–2.4)

## 2017-11-27 LAB — PROTIME-INR
INR: 1.78
Prothrombin Time: 20.5 seconds — ABNORMAL HIGH (ref 11.4–15.2)

## 2017-11-27 MED ORDER — CHLORHEXIDINE GLUCONATE CLOTH 2 % EX PADS
6.0000 | MEDICATED_PAD | Freq: Every day | CUTANEOUS | Status: DC
  Administered 2017-11-27: 6 via TOPICAL

## 2017-11-27 MED ORDER — WARFARIN SODIUM 5 MG PO TABS: 5.0000 mg | ORAL_TABLET | Freq: Once | ORAL | Status: DC

## 2017-11-27 MED ORDER — CEFDINIR 300 MG PO CAPS: 300.0000 mg | ORAL_CAPSULE | ORAL | 0 refills | Status: AC

## 2017-11-27 NOTE — Progress Notes (Signed)
Initial Nutrition Assessment  DOCUMENTATION CODES:   Obesity unspecified  INTERVENTION:   -Continue Nepro Shake po BID, each supplement provides 425 kcal and 19 grams protein -Continue MVI with minerals daily  NUTRITION DIAGNOSIS:   Increased nutrient needs related to chronic illness(ESRD on HD) as evidenced by estimated needs.  GOAL:   Patient will meet greater than or equal to 90% of their needs  MONITOR:   PO intake, Supplement acceptance, Labs, Weight trends, Skin, I & O's  REASON FOR ASSESSMENT:   Malnutrition Screening Tool    ASSESSMENT:   [name] is a 72 year old female with history of end-stage renal disease on dialysis Tuesday Thursday Saturday, history of PAF on Coumadin and amiodarone, history of bilateral kidney stone, history of right-sided nephrostomy tube removed 2 years ago, presented to the emergency room due to complaint of right-sided flank pain and transient gross hematuria. She has associated nausea, no fever.  She states she has not had UTI in the last 2 years until now, since her urologist started her on a daily Keflex prophylaxis.  Pt admitted with acute rt pyelonephritis.   6/8- s/p urology consults- awaiting blood and using cultures. No plan for surgical intervention at this time.  Spoke with pt at bedside, who reports her appetite is always very good. She consumes 3 meals per day PTA and eats "anything and everything". She reports a decreased appetite since hospitalization ("I just don't have an appetite; this always happens when I'm in the hospital"). She reports she has been consuming Nepro shakes to assist with optimizing nutritional status. She estimates she consumed approximately 50% of breakfast this morning. Documented meal completion 25-100%.   Pt denies any weight loss. She reports UBW around 200#. Her HD dry wt is 88 kg.   Discussed importance of good meal and supplement intake to promote healing. Pt amenable to continue Nepro  supplements.   Medications reviewed and include calcium acetate.   Labs reviewed.   NUTRITION - FOCUSED PHYSICAL EXAM:    Most Recent Value  Orbital Region  No depletion  Upper Arm Region  No depletion  Thoracic and Lumbar Region  No depletion  Buccal Region  No depletion  Temple Region  No depletion  Clavicle Bone Region  No depletion  Clavicle and Acromion Bone Region  No depletion  Scapular Bone Region  No depletion  Dorsal Hand  No depletion  Patellar Region  No depletion  Anterior Thigh Region  No depletion  Posterior Calf Region  No depletion  Edema (RD Assessment)  Mild  Hair  Reviewed  Eyes  Reviewed  Mouth  Reviewed  Skin  Reviewed  Nails  Reviewed       Diet Order:   Diet Order           Diet heart healthy/carb modified Room service appropriate? Yes; Fluid consistency: Thin  Diet effective now          EDUCATION NEEDS:   Education needs have been addressed  Skin:  Skin Assessment: Reviewed RN Assessment  Last BM:  11/24/17  Height:   Ht Readings from Last 1 Encounters:  11/25/17 5\' 3"  (1.6 m)    Weight:   Wt Readings from Last 1 Encounters:  11/27/17 193 lb 3.2 oz (87.6 kg)    Ideal Body Weight:  52.3 kg  BMI:  Body mass index is 34.22 kg/m.  Estimated Nutritional Needs:   Kcal:  1550-1750  Protein:  70-85 grams  Fluid:  per MD  Fredrick Dray A. Jimmye Norman, RD, LDN, CDE Pager: [phone] After hours Pager: [phone]

## 2017-11-27 NOTE — Progress Notes (Signed)
[facility] for warfarin  Indication: atrial fibrillation  Allergies  Allergen Reactions  . Vicodin [Hydrocodone-Acetaminophen] Nausea And Vomiting  . Chlorhexidine Rash    Sunburn    rash  . Percocet [Oxycodone-Acetaminophen] Nausea And Vomiting   Patient Measurements: Height: 5\' 3"  (160 cm) Weight: 193 lb 3.2 oz (87.6 kg)(scale b) IBW/kg (Calculated) : 52.4  Vital Signs: Temp: 98.4 F (36.9 C) (06/10 0447) Temp Source: Oral (06/10 0447) BP: 146/61 (06/10 0447) Pulse Rate: 79 (06/10 0447)  Labs: Recent Labs    11/24/17 2108 11/25/17 0233 11/26/17 0742 11/27/17 0509  HGB 11.3*  --  9.9* 9.7*  HCT 35.2*  --  31.3* 30.7*  PLT 208  --  174 203  LABPROT  --  26.5* 25.1* 20.5*  INR  --  2.46 2.30 1.78  CREATININE 5.73*  --   --  4.74*   Estimated Creatinine Clearance: 11.1 mL/min (A) (by C-G formula based on SCr of 4.74 mg/dL (H)).  Assessment: Pharmacy consulted to resume PTA warfarin for Afib. Patient reported last dose taken on 6/7 evening. Baseline INR this admission is 2.30. Noted that patient here with UTI with hematuria, now resolving. Will continue to monitor closely for s/sx of bleeding while on warfarin here. HgB down from admit, no overt bleeding documented.   INR subtherapeutic: 1.78, the provider does not wish to use bridge therapy at this time  PTA dose: 5mg  MWF and 2.5mg  all other days   Goal of Therapy:  INR 2-3 Monitor platelets by anticoagulation protocol: Yes   Plan:  Warfarin 5mg  PO x 1 tonight (per PTA dosing) INR/CBC daily  Monitor for s/sx of bleeding   Georga Bora, PharmD Clinical Pharmacist 11/27/2017 8:08 AM

## 2017-11-27 NOTE — Progress Notes (Signed)
Pt discharge instructions reviewed with pt. Pt verbalizes understanding and states she has no questions. Pt belonging with pt. Pt is not in distress. Pt discharged via wheelchair. Pt's daughter is driving her home.

## 2017-11-27 NOTE — Discharge Summary (Signed)
Physician Discharge Summary  [name] BPZ:[id number] DOB: 1946/04/03 DOA: 11/24/2017  PCP: Lajean Manes, MD  Admit date: 11/24/2017 Discharge date: 11/27/2017  Time spent: 40 minutes  Recommendations for Outpatient Follow-up:  1. Follow up outpatient CBC/CMP 2. Follow up INR on Wednesday in coumadin clinic (subtherapeutic today) 3. Follow up with urology in about 2 weeks   4. Continue cefdinir for additional 11 days for total 2 weeks antibiotics, then resume keflex for suppression 5. Follow final culture results 6. Follow up ventral/umbilical hernia as outpatient  Discharge Diagnoses:  Active Problems:   Pyelonephritis   Discharge Condition: stable  Filed Weights   11/26/17 1803 11/26/17 2203 11/27/17 0447  Weight: 89.9 kg (198 lb 3.1 oz) 88.7 kg (195 lb 8.8 oz) 87.6 kg (193 lb 3.2 oz)    History of present illness:  History of end-stage renal disease on dialysis Tuesday Thursday Saturday,history of PAF on Coumadin and amiodarone,history of bilateral kidney stone,history of right-sided nephrostomy tube removed 2 years ago,presented to the emergency room due to complaint of right-sided flank painand transient gross hematuria. She has associated nausea, no fever.She states she has not had UTI in the last 2 years until now,since her urologist started her on Kota Ciancio daily Keflex prophylaxis.  She was treated for pyelonephritis.  She was noted to have mild right sided hydronephrosis and possible punctate distal right ureteral stone.  She was seen by She improved on ceftriaxone.  She was seen by urology who recommended 2 weeks of antibiotics and noted that the calcificatio in question would likely pass without surgical intervention.  Will follow up in 1-2 weeks with urology.  Hospital Course:  Pyelonephritis,with mild right-sided hydronephrosis,possible punctate distal right ureteral stone -currentlyPain-free,hemodynamically stable -Blood culture NGTD x 2 days - urine cx with  multiple species -continueRocephin 2 g daily -> transitioned to cefdinir at discharge -Case discussed with urology Dr. Lovena Neighbours - recommending 2 weeks abx, they'll arrange outpatient follow up  ESRD on HD MWF -Management per nephrology  PAF: Currently in sinus rhythm continue amiodarone Coumadin per pharmacy - INR subtherapeutic on day of discharge.  Discussed with pharmacy, recommending 5 mg tonight (which is patient's typical dose) and then continuing home regimen.  Has appointment with coumadin clinic on Wednesday.  Moderate size hiatal hernia  Umbilical and ventral hernias -Currently denies symptom - follow up outpatient  Leukocytosis: 2/2 above, improved.  Improved.  Anemia: follow, downtrending, likely dilutional.  Stable on day of discharge  Procedures:  none  Consultations:  Urology  nephrology  Discharge Exam: Vitals:   11/27/17 0821 11/27/17 1229  BP: (!) 155/65 (!) 151/72  Pulse: 75 73  Resp:  20  Temp:  97.9 F (36.6 C)  SpO2:  95%   General: No acute distress. Cardiovascular: Heart sounds show Vic Esco regular rate, and rhythm. No gallops or rubs. No murmurs. No JVD. Lungs: Clear to auscultation bilaterally with good air movement. No rales, rhonchi or wheezes. Abdomen: Soft, nontender, nondistended with normal active bowel sounds. No masses. No hepatosplenomegaly. No CVA tenderness Neurological: Alert and oriented 3. Moves all extremities 4 with equal strength. Cranial nerves II through XII grossly intact. Skin: Warm and dry. No rashes or lesions. Extremities: No clubbing or cyanosis. No edema. Pedal pulses 2+. Psychiatric: Mood and affect are normal. Insight and judgment are appropriate.   Discharge Instructions   Discharge Instructions    Call MD for:  difficulty breathing, headache or visual disturbances   Complete by:  As directed    Call  MD for:  extreme fatigue   Complete by:  As directed    Call MD for:  hives   Complete by:  As directed     Call MD for:  persistant dizziness or light-headedness   Complete by:  As directed    Call MD for:  persistant nausea and vomiting   Complete by:  As directed    Call MD for:  redness, tenderness, or signs of infection (pain, swelling, redness, odor or green/yellow discharge around incision site)   Complete by:  As directed    Call MD for:  severe uncontrolled pain   Complete by:  As directed    Call MD for:  temperature >100.4   Complete by:  As directed    Diet - low sodium heart healthy   Complete by:  As directed    Discharge instructions   Complete by:  As directed    You were seen for [name] urinary tract infection and kidney stone.  Please call [facility] for [name] follow up appointment.    Your INR (what we use to monitor your warfarin) was Ethyn Schetter little low today.  Continue your current dose for now and follow up as scheduled on Wednesday for [name] repeat INR check (they may need to adjust your dose).  We will discharge you with cefdinir.  Take this every over day in the afternoon (after your dialysis session).  After you complete this course of antibiotics, you can restart your keflex.   Return if you have new, recurrent, or worsening symptoms.  Please ask your PCP to request records from this hospitalization so they know what was done and what the next steps will be.   Increase activity slowly   Complete by:  As directed      Allergies as of 11/27/2017      Reactions   Vicodin [hydrocodone-acetaminophen] Nausea And Vomiting   Chlorhexidine Rash   Sunburn    rash   Percocet [oxycodone-acetaminophen] Nausea And Vomiting      Medication List    TAKE these medications   acetaminophen 500 MG tablet Commonly known as:  TYLENOL Take 1,000 mg by mouth every 6 (six) hours as needed for moderate pain or headache.   amiodarone 200 MG tablet Commonly known as:  PACERONE Take 1 tablet (200 mg total) by mouth daily.   bimatoprost 0.01 % Soln Commonly known as:  LUMIGAN Place 1  drop into both eyes at bedtime.   calcium acetate 667 MG capsule Commonly known as:  PHOSLO Take 1,334 mg by mouth 3 (three) times daily with meals.   cefdinir 300 MG capsule Commonly known as:  OMNICEF Take 1 capsule (300 mg total) by mouth every other day for 11 days. (take in the evening after dialysis starting 6/11 - resume your keflex once this is complete)   cephALEXin 250 MG capsule Commonly known as:  KEFLEX Take 250 mg by mouth daily.   multivitamin with minerals tablet Take 1 tablet by mouth daily.   ranitidine 150 MG tablet Commonly known as:  ZANTAC Take 150 mg by mouth 2 (two) times daily as needed for heartburn.   saccharomyces boulardii 250 MG capsule Commonly known as:  FLORASTOR Take 1 capsule (250 mg total) by mouth 2 (two) times daily.   sodium chloride 0.65 % Soln nasal spray Commonly known as:  OCEAN Place 1 spray into both nostrils 3 (three) times daily as needed for congestion.   timolol 0.5 % ophthalmic solution Commonly known  as:  BETIMOL Place 1 drop into both eyes every morning.   warfarin 5 MG tablet Commonly known as:  COUMADIN Take as directed. If you are unsure how to take this medication, talk to your nurse or doctor. Original instructions:  TAKE AS DIRECTED BY COUMADIN CLINIC. What changed:    how much to take  how to take this  when to take this  additional instructions      Allergies  Allergen Reactions  . Vicodin [Hydrocodone-Acetaminophen] Nausea And Vomiting  . Chlorhexidine Rash    Sunburn    rash  . Percocet [Oxycodone-Acetaminophen] Nausea And Vomiting   Follow-up Information    Ceasar Mons, MD In 2 weeks.   Specialty:  Urology Contact information: [place] [address] [phone]        Lajean Manes, MD Follow up.   Specialty:  Internal Medicine Why:  call for Peniel Hass follow up appointment Contact information: [address] [place]  [address] [phone]            The results of significant diagnostics from this hospitalization (including imaging, microbiology, ancillary and laboratory) are listed below for reference.    Significant Diagnostic Studies: Ct Renal Stone Study  Result Date: 11/24/2017 CLINICAL DATA:  Hematuria with right lower back pain beginning this afternoon. EXAM: CT ABDOMEN AND PELVIS WITHOUT CONTRAST TECHNIQUE: Multidetector CT imaging of the abdomen and pelvis was performed following the standard protocol without IV contrast. COMPARISON:  05/02/2015 FINDINGS: Lower chest: Mild left basilar atelectasis. Moderate size hiatal hernia. Moderate calcified plaque over the left main and lateral circumflex coronary arteries. Hepatobiliary: Previous cholecystectomy. The liver and biliary tree are normal. Pancreas: Normal. Spleen: Normal. Adrenals/Urinary Tract: Adrenal glands are normal. Atrophic left kidney with Chealsey Miyamoto few punctate calcifications unchanged. Subtle chronic prominence of the left intrarenal collecting system and ureter. No left ureteral stones. There is mild right-sided hydronephrosis with moderate right perinephric inflammation/fluid. Single punctate stone over the lower pole right kidney. Right ureter is dilated proximally. Possible faint punctate stone over the distal right ureter although this is not occlusive. Bladder is collapsed. Stomach/Bowel: Moderate size hiatal hernia. Small bowel is within normal. Appendix is normal. There is diverticulosis of the colon most prominent over the sigmoid colon without active inflammation. Vascular/Lymphatic: Slight increase in size of Fayette Gasner periaortic lymph node measuring 1.4 cm by long axis. Small retrocaval lymph node unchanged. Vascular structures are unremarkable. Reproductive: Previous hysterectomy. Other: Small umbilical hernia containing only mesenteric fat. Diastases of the rectus abdominus muscles over the lower abdomen/pelvis with associated herniation of  peritoneal fat unchanged. Musculoskeletal: Degenerative changes of the spine without significant change. IMPRESSION: Bilateral nephrolithiasis. Mild right-sided hydronephrosis with moderate right perinephric inflammation/fluid. Possible punctate distal right ureteral stone, although not conclusive. Findings may be due to recent passage of Clarrissa Shimkus stone versus infection. Colonic diverticulosis. Umbilical hernia and midline lower abdominal/pelvic ventral hernia containing only peritoneal fat unchanged. Moderate size hiatal hernia. Atherosclerotic coronary artery disease. Electronically Signed   By: Marin Olp M.D.   On: 11/24/2017 22:23    Microbiology: Recent Results (from the past 240 hour(s))  Blood culture (routine x 2)     Status: None (Preliminary result)   Collection Time: 11/25/17  4:27 AM  Result Value Ref Range Status   Specimen Description BLOOD RIGHT HAND  Final   Special Requests   Final    BOTTLES DRAWN AEROBIC AND ANAEROBIC Blood Culture adequate volume   Culture  Final    NO GROWTH 2 DAYS Performed at [facility], [place] [address] [place], [place] [address]    Report Status PENDING  Incomplete  Blood culture (routine x 2)     Status: None (Preliminary result)   Collection Time: 11/25/17  4:30 AM  Result Value Ref Range Status   Specimen Description BLOOD RIGHT LOWER ARM  Final   Special Requests   Final    BOTTLES DRAWN AEROBIC AND ANAEROBIC Blood Culture adequate volume   Culture   Final    NO GROWTH 2 DAYS Performed at [facility], [place] [address] [place], [place] [address]    Report Status PENDING  Incomplete  Urine culture     Status: Abnormal   Collection Time: 11/25/17  7:48 AM  Result Value Ref Range Status   Specimen Description URINE, CATHETERIZED  Final   Special Requests   Final    Normal Performed at [facility], [place] [address] [place],  [address]    Culture MULTIPLE SPECIES PRESENT, SUGGEST RECOLLECTION (Nicodemus Denk)  Final   Report  Status 11/26/2017 FINAL  Final  MRSA PCR Screening     Status: Abnormal   Collection Time: 11/25/17  8:46 AM  Result Value Ref Range Status   MRSA by PCR POSITIVE (Yarimar Lavis) NEGATIVE Final    Comment:        The GeneXpert MRSA Assay (FDA approved for NASAL specimens only), is one component of Amarri Satterly comprehensive MRSA colonization surveillance program. It is not intended to diagnose MRSA infection nor to guide or monitor treatment for MRSA infections. RESULT CALLED TO, READ BACK BY AND VERIFIED WITH: AKarrie Doffing, RN AT 1207 ON 11/25/17 BY C. JESSUP, MLT      Labs: Basic Metabolic Panel: Recent Labs  Lab 11/24/17 2108 11/27/17 0509  NA 137 139  K 4.0 3.7  CL 98* 99*  CO2 27 31  GLUCOSE 118* 80  BUN 30* 19  CREATININE 5.73* 4.74*  CALCIUM 8.7* 8.3*  MG  --  2.0   Liver Function Tests: No results for input(s): AST, ALT, ALKPHOS, BILITOT, PROT, ALBUMIN in the last 168 hours. No results for input(s): LIPASE, AMYLASE in the last 168 hours. No results for input(s): AMMONIA in the last 168 hours. CBC: Recent Labs  Lab 11/24/17 2108 11/26/17 0742 11/27/17 0509  WBC 16.1* 11.4* 8.5  NEUTROABS 14.7*  --   --   HGB 11.3* 9.9* 9.7*  HCT 35.2* 31.3* 30.7*  MCV 94.6 95.7 96.2  PLT 208 174 203   Cardiac Enzymes: No results for input(s): CKTOTAL, CKMB, CKMBINDEX, TROPONINI in the last 168 hours. BNP: BNP (last 3 results) No results for input(s): BNP in the last 8760 hours.  ProBNP (last 3 results) No results for input(s): PROBNP in the last 8760 hours.  CBG: No results for input(s): GLUCAP in the last 168 hours.     Signed:  Fayrene Helper MD.  [facility] 11/27/2017, 4:05 PM

## 2017-11-27 NOTE — Progress Notes (Addendum)
[name] Progress Note  Subjective: feeling better again today  Vitals:   11/26/17 2203 11/27/17 0019 11/27/17 0447 11/27/17 0821  BP: 127/60 (!) 124/49 (!) 146/61 (!) 155/65  Pulse: 75 78 79 75  Resp: 16 18 18    Temp: 98.4 F (36.9 C) 99.1 F (37.3 C) 98.4 F (36.9 C)   TempSrc: Oral Oral Oral   SpO2: 95% 94% 90%   Weight: 88.7 kg (195 lb 8.8 oz)  87.6 kg (193 lb 3.2 oz)   Height:        Inpatient medications: . amiodarone  200 mg Oral Daily  . calcitRIOL  0.25 mcg Oral Q T,Th,Sa-HD  . calcium acetate  1,334 mg Oral TID WC  . Chlorhexidine Gluconate Cloth  6 each Topical Q0600  . feeding supplement (NEPRO CARB STEADY)  237 mL Oral BID BM  . latanoprost  1 drop Both Eyes QHS  . multivitamin with minerals  1 tablet Oral Daily  . mupirocin ointment  1 application Nasal BID  . saccharomyces boulardii  250 mg Oral BID  . timolol  1 drop Both Eyes q morning - 10a  . warfarin  5 mg Oral ONCE-1800  . Warfarin - Pharmacist Dosing Inpatient   Does not apply q1800   . cefTRIAXone (ROCEPHIN)  IV Stopped (11/27/17 0630)   acetaminophen, ondansetron (ZOFRAN) IV, sodium chloride  Exam: General: WDWN obese female  Head: NCAT sclera not icteric MMM Neck: Supple. No JVD No masses Lungs: CTA bilaterally without wheezes, rales, or rhonchi. Breathing is unlabored. Heart: RRR with S1 S2 Abdomen: soft NT + BS Lower extremities:without edema or ischemic changes, no open wounds  Neuro: A & O  X 3. Moves all extremities spontaneously. Psych:  Pleasant. Normal affect  Dialysis Access: L forearm AVF +bruit     Dialysis:  [facility] TTS 4h 180NRe 400/800 EDW 89kg 2K/2Ca L AVF No heparin -Calcitriol 0.61mcg TIW  Assessment: 1. R pyelonephritis/ R hydro/ possible distal R ureteral stone - on IV abx, blood cx's negative and urine cx's mult species. Urology following w/ no surg intervention planned, cont medical Rx for now 2. ESRD - TTS HD.  Had HD yest in place of Sat.  HD  Tuesday if here 3. Hypertension/volume  - BP's ok.  No excess on exam under dry.  4. Anemia  - Hgb 11.3. No ESA needs currently  5. Metabolic bone disease -  Continue Calcitriol/binders  6. Nutrition - Renal diet vitamins 7. P AFib -Amiodarone/Coumadin per pharmacy    Plan - as above   Kelly Splinter MD [place] pager [phone]   11/27/2017, 9:01 AM   Recent Labs  Lab 11/24/17 2108 11/27/17 0509  NA 137 139  K 4.0 3.7  CL 98* 99*  CO2 27 31  GLUCOSE 118* 80  BUN 30* 19  CREATININE 5.73* 4.74*  CALCIUM 8.7* 8.3*   No results for input(s): AST, ALT, ALKPHOS, BILITOT, PROT, ALBUMIN in the last 168 hours. Recent Labs  Lab 11/24/17 2108 11/26/17 0742 11/27/17 0509  WBC 16.1* 11.4* 8.5  NEUTROABS 14.7*  --   --   HGB 11.3* 9.9* 9.7*  HCT 35.2* 31.3* 30.7*  MCV 94.6 95.7 96.2  PLT 208 174 203   Iron/TIBC/Ferritin/ %Sat    Component Value Date/Time   IRON 41 10/29/2015 1000   TIBC 175 (L) 10/29/2015 1000   FERRITIN 312 (H) 10/29/2015 1000   IRONPCTSAT 23 10/29/2015 1000

## 2017-11-27 NOTE — Care Management CC44 (Signed)
Condition Code 44 Documentation Completed  Patient Details  Name: [name] MRN: [id number] Date of Birth: [birth year]   Condition Code 44 given:  Yes Patient signature on Condition Code 44 notice:  Yes Documentation of 2 MD's agreement:  Yes Code 44 added to claim:  Yes    Royston Bake, RN 11/27/2017, 4:20 PM

## 2017-11-27 NOTE — Care Management Obs Status (Signed)
[facility] NOTIFICATION   Patient Details  Name: [name] MRN: [id number] Date of Birth: May 09, 1946   Medicare Observation Status Notification Given:  Yes    Royston Bake, RN 11/27/2017, 4:20 PM

## 2017-11-28 DIAGNOSIS — E039 Hypothyroidism, unspecified: Secondary | ICD-10-CM | POA: Diagnosis not present

## 2017-11-28 DIAGNOSIS — D631 Anemia in chronic kidney disease: Secondary | ICD-10-CM | POA: Diagnosis not present

## 2017-11-28 DIAGNOSIS — I482 Chronic atrial fibrillation: Secondary | ICD-10-CM | POA: Diagnosis not present

## 2017-11-28 DIAGNOSIS — N2581 Secondary hyperparathyroidism of renal origin: Secondary | ICD-10-CM | POA: Diagnosis not present

## 2017-11-28 DIAGNOSIS — D509 Iron deficiency anemia, unspecified: Secondary | ICD-10-CM | POA: Diagnosis not present

## 2017-11-28 DIAGNOSIS — Z5181 Encounter for therapeutic drug level monitoring: Secondary | ICD-10-CM | POA: Diagnosis not present

## 2017-11-28 DIAGNOSIS — D688 Other specified coagulation defects: Secondary | ICD-10-CM | POA: Diagnosis not present

## 2017-11-28 DIAGNOSIS — R197 Diarrhea, unspecified: Secondary | ICD-10-CM | POA: Diagnosis not present

## 2017-11-28 DIAGNOSIS — N186 End stage renal disease: Secondary | ICD-10-CM | POA: Diagnosis not present

## 2017-11-28 DIAGNOSIS — R51 Headache: Secondary | ICD-10-CM | POA: Diagnosis not present

## 2017-11-29 ENCOUNTER — Ambulatory Visit: Payer: Medicare Other | Admitting: *Deleted

## 2017-11-29 DIAGNOSIS — Z5181 Encounter for therapeutic drug level monitoring: Secondary | ICD-10-CM | POA: Diagnosis not present

## 2017-11-29 DIAGNOSIS — I4891 Unspecified atrial fibrillation: Secondary | ICD-10-CM

## 2017-11-29 DIAGNOSIS — I480 Paroxysmal atrial fibrillation: Secondary | ICD-10-CM | POA: Diagnosis not present

## 2017-11-29 LAB — POCT INR: INR: 1.9 — AB (ref 2.0–3.0)

## 2017-11-29 NOTE — Patient Instructions (Signed)
Description   Today June 12th take 1 and 1/2 tablets then continue taking 1/2 tablet daily except 1 tablet on Monday, Wednesday, and Friday. Recheck INR in 2 weeks Call with any new medications or if scheduled for any procedures or with any questions [phone]

## 2017-11-30 DIAGNOSIS — E039 Hypothyroidism, unspecified: Secondary | ICD-10-CM | POA: Diagnosis not present

## 2017-11-30 DIAGNOSIS — D631 Anemia in chronic kidney disease: Secondary | ICD-10-CM | POA: Diagnosis not present

## 2017-11-30 DIAGNOSIS — Z5181 Encounter for therapeutic drug level monitoring: Secondary | ICD-10-CM | POA: Diagnosis not present

## 2017-11-30 DIAGNOSIS — N186 End stage renal disease: Secondary | ICD-10-CM | POA: Diagnosis not present

## 2017-11-30 DIAGNOSIS — D688 Other specified coagulation defects: Secondary | ICD-10-CM | POA: Diagnosis not present

## 2017-11-30 DIAGNOSIS — R51 Headache: Secondary | ICD-10-CM | POA: Diagnosis not present

## 2017-11-30 DIAGNOSIS — I482 Chronic atrial fibrillation: Secondary | ICD-10-CM | POA: Diagnosis not present

## 2017-11-30 DIAGNOSIS — D509 Iron deficiency anemia, unspecified: Secondary | ICD-10-CM | POA: Diagnosis not present

## 2017-11-30 DIAGNOSIS — R197 Diarrhea, unspecified: Secondary | ICD-10-CM | POA: Diagnosis not present

## 2017-11-30 DIAGNOSIS — N2581 Secondary hyperparathyroidism of renal origin: Secondary | ICD-10-CM | POA: Diagnosis not present

## 2017-11-30 LAB — CULTURE, BLOOD (ROUTINE X 2)
Culture: NO GROWTH
Culture: NO GROWTH
Special Requests: ADEQUATE
Special Requests: ADEQUATE

## 2017-12-02 DIAGNOSIS — I482 Chronic atrial fibrillation: Secondary | ICD-10-CM | POA: Diagnosis not present

## 2017-12-02 DIAGNOSIS — D509 Iron deficiency anemia, unspecified: Secondary | ICD-10-CM | POA: Diagnosis not present

## 2017-12-02 DIAGNOSIS — R197 Diarrhea, unspecified: Secondary | ICD-10-CM | POA: Diagnosis not present

## 2017-12-02 DIAGNOSIS — D688 Other specified coagulation defects: Secondary | ICD-10-CM | POA: Diagnosis not present

## 2017-12-02 DIAGNOSIS — D631 Anemia in chronic kidney disease: Secondary | ICD-10-CM | POA: Diagnosis not present

## 2017-12-02 DIAGNOSIS — R51 Headache: Secondary | ICD-10-CM | POA: Diagnosis not present

## 2017-12-02 DIAGNOSIS — Z5181 Encounter for therapeutic drug level monitoring: Secondary | ICD-10-CM | POA: Diagnosis not present

## 2017-12-02 DIAGNOSIS — N2581 Secondary hyperparathyroidism of renal origin: Secondary | ICD-10-CM | POA: Diagnosis not present

## 2017-12-02 DIAGNOSIS — N186 End stage renal disease: Secondary | ICD-10-CM | POA: Diagnosis not present

## 2017-12-02 DIAGNOSIS — E039 Hypothyroidism, unspecified: Secondary | ICD-10-CM | POA: Diagnosis not present

## 2017-12-05 DIAGNOSIS — E039 Hypothyroidism, unspecified: Secondary | ICD-10-CM | POA: Diagnosis not present

## 2017-12-05 DIAGNOSIS — R197 Diarrhea, unspecified: Secondary | ICD-10-CM | POA: Diagnosis not present

## 2017-12-05 DIAGNOSIS — N2581 Secondary hyperparathyroidism of renal origin: Secondary | ICD-10-CM | POA: Diagnosis not present

## 2017-12-05 DIAGNOSIS — D688 Other specified coagulation defects: Secondary | ICD-10-CM | POA: Diagnosis not present

## 2017-12-05 DIAGNOSIS — N186 End stage renal disease: Secondary | ICD-10-CM | POA: Diagnosis not present

## 2017-12-05 DIAGNOSIS — D631 Anemia in chronic kidney disease: Secondary | ICD-10-CM | POA: Diagnosis not present

## 2017-12-05 DIAGNOSIS — D509 Iron deficiency anemia, unspecified: Secondary | ICD-10-CM | POA: Diagnosis not present

## 2017-12-05 DIAGNOSIS — Z5181 Encounter for therapeutic drug level monitoring: Secondary | ICD-10-CM | POA: Diagnosis not present

## 2017-12-05 DIAGNOSIS — R51 Headache: Secondary | ICD-10-CM | POA: Diagnosis not present

## 2017-12-05 DIAGNOSIS — I482 Chronic atrial fibrillation: Secondary | ICD-10-CM | POA: Diagnosis not present

## 2017-12-07 DIAGNOSIS — I482 Chronic atrial fibrillation: Secondary | ICD-10-CM | POA: Diagnosis not present

## 2017-12-07 DIAGNOSIS — D631 Anemia in chronic kidney disease: Secondary | ICD-10-CM | POA: Diagnosis not present

## 2017-12-07 DIAGNOSIS — Z5181 Encounter for therapeutic drug level monitoring: Secondary | ICD-10-CM | POA: Diagnosis not present

## 2017-12-07 DIAGNOSIS — R51 Headache: Secondary | ICD-10-CM | POA: Diagnosis not present

## 2017-12-07 DIAGNOSIS — D688 Other specified coagulation defects: Secondary | ICD-10-CM | POA: Diagnosis not present

## 2017-12-07 DIAGNOSIS — E039 Hypothyroidism, unspecified: Secondary | ICD-10-CM | POA: Diagnosis not present

## 2017-12-07 DIAGNOSIS — N186 End stage renal disease: Secondary | ICD-10-CM | POA: Diagnosis not present

## 2017-12-07 DIAGNOSIS — N2581 Secondary hyperparathyroidism of renal origin: Secondary | ICD-10-CM | POA: Diagnosis not present

## 2017-12-07 DIAGNOSIS — R197 Diarrhea, unspecified: Secondary | ICD-10-CM | POA: Diagnosis not present

## 2017-12-07 DIAGNOSIS — D509 Iron deficiency anemia, unspecified: Secondary | ICD-10-CM | POA: Diagnosis not present

## 2017-12-08 DIAGNOSIS — N12 Tubulo-interstitial nephritis, not specified as acute or chronic: Secondary | ICD-10-CM | POA: Diagnosis not present

## 2017-12-08 DIAGNOSIS — N186 End stage renal disease: Secondary | ICD-10-CM | POA: Diagnosis not present

## 2017-12-08 DIAGNOSIS — I129 Hypertensive chronic kidney disease with stage 1 through stage 4 chronic kidney disease, or unspecified chronic kidney disease: Secondary | ICD-10-CM | POA: Diagnosis not present

## 2017-12-08 DIAGNOSIS — I480 Paroxysmal atrial fibrillation: Secondary | ICD-10-CM | POA: Diagnosis not present

## 2017-12-09 DIAGNOSIS — I482 Chronic atrial fibrillation: Secondary | ICD-10-CM | POA: Diagnosis not present

## 2017-12-09 DIAGNOSIS — E039 Hypothyroidism, unspecified: Secondary | ICD-10-CM | POA: Diagnosis not present

## 2017-12-09 DIAGNOSIS — D631 Anemia in chronic kidney disease: Secondary | ICD-10-CM | POA: Diagnosis not present

## 2017-12-09 DIAGNOSIS — D688 Other specified coagulation defects: Secondary | ICD-10-CM | POA: Diagnosis not present

## 2017-12-09 DIAGNOSIS — Z5181 Encounter for therapeutic drug level monitoring: Secondary | ICD-10-CM | POA: Diagnosis not present

## 2017-12-09 DIAGNOSIS — N186 End stage renal disease: Secondary | ICD-10-CM | POA: Diagnosis not present

## 2017-12-09 DIAGNOSIS — R197 Diarrhea, unspecified: Secondary | ICD-10-CM | POA: Diagnosis not present

## 2017-12-09 DIAGNOSIS — D509 Iron deficiency anemia, unspecified: Secondary | ICD-10-CM | POA: Diagnosis not present

## 2017-12-09 DIAGNOSIS — R51 Headache: Secondary | ICD-10-CM | POA: Diagnosis not present

## 2017-12-09 DIAGNOSIS — N2581 Secondary hyperparathyroidism of renal origin: Secondary | ICD-10-CM | POA: Diagnosis not present

## 2017-12-11 DIAGNOSIS — N130 Hydronephrosis with ureteropelvic junction obstruction: Secondary | ICD-10-CM | POA: Diagnosis not present

## 2017-12-11 DIAGNOSIS — N3020 Other chronic cystitis without hematuria: Secondary | ICD-10-CM | POA: Diagnosis not present

## 2017-12-11 DIAGNOSIS — N186 End stage renal disease: Secondary | ICD-10-CM | POA: Diagnosis not present

## 2017-12-12 DIAGNOSIS — E039 Hypothyroidism, unspecified: Secondary | ICD-10-CM | POA: Diagnosis not present

## 2017-12-12 DIAGNOSIS — D509 Iron deficiency anemia, unspecified: Secondary | ICD-10-CM | POA: Diagnosis not present

## 2017-12-12 DIAGNOSIS — D631 Anemia in chronic kidney disease: Secondary | ICD-10-CM | POA: Diagnosis not present

## 2017-12-12 DIAGNOSIS — N186 End stage renal disease: Secondary | ICD-10-CM | POA: Diagnosis not present

## 2017-12-12 DIAGNOSIS — D688 Other specified coagulation defects: Secondary | ICD-10-CM | POA: Diagnosis not present

## 2017-12-12 DIAGNOSIS — R51 Headache: Secondary | ICD-10-CM | POA: Diagnosis not present

## 2017-12-12 DIAGNOSIS — R197 Diarrhea, unspecified: Secondary | ICD-10-CM | POA: Diagnosis not present

## 2017-12-12 DIAGNOSIS — N2581 Secondary hyperparathyroidism of renal origin: Secondary | ICD-10-CM | POA: Diagnosis not present

## 2017-12-12 DIAGNOSIS — Z5181 Encounter for therapeutic drug level monitoring: Secondary | ICD-10-CM | POA: Diagnosis not present

## 2017-12-12 DIAGNOSIS — I482 Chronic atrial fibrillation: Secondary | ICD-10-CM | POA: Diagnosis not present

## 2017-12-13 ENCOUNTER — Ambulatory Visit: Payer: Medicare Other | Admitting: *Deleted

## 2017-12-13 DIAGNOSIS — Z5181 Encounter for therapeutic drug level monitoring: Secondary | ICD-10-CM

## 2017-12-13 DIAGNOSIS — I480 Paroxysmal atrial fibrillation: Secondary | ICD-10-CM

## 2017-12-13 DIAGNOSIS — I4891 Unspecified atrial fibrillation: Secondary | ICD-10-CM | POA: Diagnosis not present

## 2017-12-13 LAB — POCT INR: INR: 2.6 (ref 2.0–3.0)

## 2017-12-13 NOTE — Patient Instructions (Signed)
Description   Continue taking 1/2 tablet daily except 1 tablet on Monday, Wednesday, and Friday. Recheck INR in 3 weeks Call with any new medications or if scheduled for any procedures or with any questions [phone]

## 2017-12-14 DIAGNOSIS — D688 Other specified coagulation defects: Secondary | ICD-10-CM | POA: Diagnosis not present

## 2017-12-14 DIAGNOSIS — R197 Diarrhea, unspecified: Secondary | ICD-10-CM | POA: Diagnosis not present

## 2017-12-14 DIAGNOSIS — N186 End stage renal disease: Secondary | ICD-10-CM | POA: Diagnosis not present

## 2017-12-14 DIAGNOSIS — D631 Anemia in chronic kidney disease: Secondary | ICD-10-CM | POA: Diagnosis not present

## 2017-12-14 DIAGNOSIS — R51 Headache: Secondary | ICD-10-CM | POA: Diagnosis not present

## 2017-12-14 DIAGNOSIS — N2581 Secondary hyperparathyroidism of renal origin: Secondary | ICD-10-CM | POA: Diagnosis not present

## 2017-12-14 DIAGNOSIS — E039 Hypothyroidism, unspecified: Secondary | ICD-10-CM | POA: Diagnosis not present

## 2017-12-14 DIAGNOSIS — Z5181 Encounter for therapeutic drug level monitoring: Secondary | ICD-10-CM | POA: Diagnosis not present

## 2017-12-14 DIAGNOSIS — I482 Chronic atrial fibrillation: Secondary | ICD-10-CM | POA: Diagnosis not present

## 2017-12-14 DIAGNOSIS — D509 Iron deficiency anemia, unspecified: Secondary | ICD-10-CM | POA: Diagnosis not present

## 2017-12-16 DIAGNOSIS — D509 Iron deficiency anemia, unspecified: Secondary | ICD-10-CM | POA: Diagnosis not present

## 2017-12-16 DIAGNOSIS — I482 Chronic atrial fibrillation: Secondary | ICD-10-CM | POA: Diagnosis not present

## 2017-12-16 DIAGNOSIS — R197 Diarrhea, unspecified: Secondary | ICD-10-CM | POA: Diagnosis not present

## 2017-12-16 DIAGNOSIS — D688 Other specified coagulation defects: Secondary | ICD-10-CM | POA: Diagnosis not present

## 2017-12-16 DIAGNOSIS — R51 Headache: Secondary | ICD-10-CM | POA: Diagnosis not present

## 2017-12-16 DIAGNOSIS — D631 Anemia in chronic kidney disease: Secondary | ICD-10-CM | POA: Diagnosis not present

## 2017-12-16 DIAGNOSIS — N186 End stage renal disease: Secondary | ICD-10-CM | POA: Diagnosis not present

## 2017-12-16 DIAGNOSIS — N2581 Secondary hyperparathyroidism of renal origin: Secondary | ICD-10-CM | POA: Diagnosis not present

## 2017-12-16 DIAGNOSIS — Z5181 Encounter for therapeutic drug level monitoring: Secondary | ICD-10-CM | POA: Diagnosis not present

## 2017-12-16 DIAGNOSIS — E039 Hypothyroidism, unspecified: Secondary | ICD-10-CM | POA: Diagnosis not present

## 2017-12-17 DIAGNOSIS — N186 End stage renal disease: Secondary | ICD-10-CM | POA: Diagnosis not present

## 2017-12-17 DIAGNOSIS — I129 Hypertensive chronic kidney disease with stage 1 through stage 4 chronic kidney disease, or unspecified chronic kidney disease: Secondary | ICD-10-CM | POA: Diagnosis not present

## 2017-12-17 DIAGNOSIS — Z992 Dependence on renal dialysis: Secondary | ICD-10-CM | POA: Diagnosis not present

## 2017-12-19 DIAGNOSIS — E039 Hypothyroidism, unspecified: Secondary | ICD-10-CM | POA: Diagnosis not present

## 2017-12-19 DIAGNOSIS — I482 Chronic atrial fibrillation: Secondary | ICD-10-CM | POA: Diagnosis not present

## 2017-12-19 DIAGNOSIS — N2581 Secondary hyperparathyroidism of renal origin: Secondary | ICD-10-CM | POA: Diagnosis not present

## 2017-12-19 DIAGNOSIS — Z5181 Encounter for therapeutic drug level monitoring: Secondary | ICD-10-CM | POA: Diagnosis not present

## 2017-12-19 DIAGNOSIS — N186 End stage renal disease: Secondary | ICD-10-CM | POA: Diagnosis not present

## 2017-12-19 DIAGNOSIS — D509 Iron deficiency anemia, unspecified: Secondary | ICD-10-CM | POA: Diagnosis not present

## 2017-12-19 DIAGNOSIS — D631 Anemia in chronic kidney disease: Secondary | ICD-10-CM | POA: Diagnosis not present

## 2017-12-19 DIAGNOSIS — D688 Other specified coagulation defects: Secondary | ICD-10-CM | POA: Diagnosis not present

## 2017-12-21 DIAGNOSIS — N186 End stage renal disease: Secondary | ICD-10-CM | POA: Diagnosis not present

## 2017-12-21 DIAGNOSIS — D509 Iron deficiency anemia, unspecified: Secondary | ICD-10-CM | POA: Diagnosis not present

## 2017-12-21 DIAGNOSIS — I482 Chronic atrial fibrillation: Secondary | ICD-10-CM | POA: Diagnosis not present

## 2017-12-21 DIAGNOSIS — D688 Other specified coagulation defects: Secondary | ICD-10-CM | POA: Diagnosis not present

## 2017-12-21 DIAGNOSIS — N2581 Secondary hyperparathyroidism of renal origin: Secondary | ICD-10-CM | POA: Diagnosis not present

## 2017-12-21 DIAGNOSIS — E039 Hypothyroidism, unspecified: Secondary | ICD-10-CM | POA: Diagnosis not present

## 2017-12-21 DIAGNOSIS — D631 Anemia in chronic kidney disease: Secondary | ICD-10-CM | POA: Diagnosis not present

## 2017-12-21 DIAGNOSIS — Z5181 Encounter for therapeutic drug level monitoring: Secondary | ICD-10-CM | POA: Diagnosis not present

## 2017-12-23 DIAGNOSIS — D688 Other specified coagulation defects: Secondary | ICD-10-CM | POA: Diagnosis not present

## 2017-12-23 DIAGNOSIS — D509 Iron deficiency anemia, unspecified: Secondary | ICD-10-CM | POA: Diagnosis not present

## 2017-12-23 DIAGNOSIS — N2581 Secondary hyperparathyroidism of renal origin: Secondary | ICD-10-CM | POA: Diagnosis not present

## 2017-12-23 DIAGNOSIS — D631 Anemia in chronic kidney disease: Secondary | ICD-10-CM | POA: Diagnosis not present

## 2017-12-23 DIAGNOSIS — E039 Hypothyroidism, unspecified: Secondary | ICD-10-CM | POA: Diagnosis not present

## 2017-12-23 DIAGNOSIS — N186 End stage renal disease: Secondary | ICD-10-CM | POA: Diagnosis not present

## 2017-12-23 DIAGNOSIS — I482 Chronic atrial fibrillation: Secondary | ICD-10-CM | POA: Diagnosis not present

## 2017-12-23 DIAGNOSIS — Z5181 Encounter for therapeutic drug level monitoring: Secondary | ICD-10-CM | POA: Diagnosis not present

## 2017-12-26 ENCOUNTER — Telehealth: Payer: Self-pay | Admitting: Pharmacist

## 2017-12-26 ENCOUNTER — Ambulatory Visit: Payer: Medicare Other | Admitting: *Deleted

## 2017-12-26 DIAGNOSIS — D509 Iron deficiency anemia, unspecified: Secondary | ICD-10-CM | POA: Diagnosis not present

## 2017-12-26 DIAGNOSIS — D688 Other specified coagulation defects: Secondary | ICD-10-CM | POA: Diagnosis not present

## 2017-12-26 DIAGNOSIS — I4891 Unspecified atrial fibrillation: Secondary | ICD-10-CM | POA: Diagnosis not present

## 2017-12-26 DIAGNOSIS — I482 Chronic atrial fibrillation: Secondary | ICD-10-CM | POA: Diagnosis not present

## 2017-12-26 DIAGNOSIS — Z5181 Encounter for therapeutic drug level monitoring: Secondary | ICD-10-CM

## 2017-12-26 DIAGNOSIS — D631 Anemia in chronic kidney disease: Secondary | ICD-10-CM | POA: Diagnosis not present

## 2017-12-26 DIAGNOSIS — N186 End stage renal disease: Secondary | ICD-10-CM | POA: Diagnosis not present

## 2017-12-26 DIAGNOSIS — I480 Paroxysmal atrial fibrillation: Secondary | ICD-10-CM | POA: Diagnosis not present

## 2017-12-26 DIAGNOSIS — N2581 Secondary hyperparathyroidism of renal origin: Secondary | ICD-10-CM | POA: Diagnosis not present

## 2017-12-26 DIAGNOSIS — E039 Hypothyroidism, unspecified: Secondary | ICD-10-CM | POA: Diagnosis not present

## 2017-12-26 LAB — POCT INR: INR: 1.8 — AB (ref 2.0–3.0)

## 2017-12-26 NOTE — Patient Instructions (Signed)
Description   Today July 9th take 1 tablet then continue taking 1/2 tablet daily except 1 tablet on Monday, Wednesday, and Friday. Recheck INR in 2 weeks Call with any new medications or if scheduled for any procedures or with any questions [phone] Taking Bactrim DS tablet only on Dialysis days for 14 days will finish July 27th Faxed results to Dr Jackson Latino office as requested [phone]

## 2017-12-26 NOTE — Telephone Encounter (Signed)
Received call from Amber with Dr Gilford Rile at [facility] who reports that they started pt on Bactrim DS BID x 14 days on 6/27 for a UTI. They are calling to ask [name] to monitor patient's INR more frequently.  Pt ideally should have had an INR check within 3 days of starting Bactrim. Called patient and she states she has been taking Bactrim only on dialysis days. Scheduled INR check for today. Dr Gilford Rile office would like [name] to fax her INR level to his office as an [place]. Fax [phone].

## 2017-12-28 DIAGNOSIS — E039 Hypothyroidism, unspecified: Secondary | ICD-10-CM | POA: Diagnosis not present

## 2017-12-28 DIAGNOSIS — N2581 Secondary hyperparathyroidism of renal origin: Secondary | ICD-10-CM | POA: Diagnosis not present

## 2017-12-28 DIAGNOSIS — D688 Other specified coagulation defects: Secondary | ICD-10-CM | POA: Diagnosis not present

## 2017-12-28 DIAGNOSIS — Z5181 Encounter for therapeutic drug level monitoring: Secondary | ICD-10-CM | POA: Diagnosis not present

## 2017-12-28 DIAGNOSIS — N186 End stage renal disease: Secondary | ICD-10-CM | POA: Diagnosis not present

## 2017-12-28 DIAGNOSIS — D509 Iron deficiency anemia, unspecified: Secondary | ICD-10-CM | POA: Diagnosis not present

## 2017-12-28 DIAGNOSIS — I482 Chronic atrial fibrillation: Secondary | ICD-10-CM | POA: Diagnosis not present

## 2017-12-28 DIAGNOSIS — D631 Anemia in chronic kidney disease: Secondary | ICD-10-CM | POA: Diagnosis not present

## 2017-12-30 DIAGNOSIS — N2581 Secondary hyperparathyroidism of renal origin: Secondary | ICD-10-CM | POA: Diagnosis not present

## 2017-12-30 DIAGNOSIS — Z5181 Encounter for therapeutic drug level monitoring: Secondary | ICD-10-CM | POA: Diagnosis not present

## 2017-12-30 DIAGNOSIS — I482 Chronic atrial fibrillation: Secondary | ICD-10-CM | POA: Diagnosis not present

## 2017-12-30 DIAGNOSIS — D631 Anemia in chronic kidney disease: Secondary | ICD-10-CM | POA: Diagnosis not present

## 2017-12-30 DIAGNOSIS — D688 Other specified coagulation defects: Secondary | ICD-10-CM | POA: Diagnosis not present

## 2017-12-30 DIAGNOSIS — N186 End stage renal disease: Secondary | ICD-10-CM | POA: Diagnosis not present

## 2017-12-30 DIAGNOSIS — D509 Iron deficiency anemia, unspecified: Secondary | ICD-10-CM | POA: Diagnosis not present

## 2017-12-30 DIAGNOSIS — E039 Hypothyroidism, unspecified: Secondary | ICD-10-CM | POA: Diagnosis not present

## 2018-01-02 DIAGNOSIS — Z5181 Encounter for therapeutic drug level monitoring: Secondary | ICD-10-CM | POA: Diagnosis not present

## 2018-01-02 DIAGNOSIS — N2581 Secondary hyperparathyroidism of renal origin: Secondary | ICD-10-CM | POA: Diagnosis not present

## 2018-01-02 DIAGNOSIS — I482 Chronic atrial fibrillation: Secondary | ICD-10-CM | POA: Diagnosis not present

## 2018-01-02 DIAGNOSIS — N186 End stage renal disease: Secondary | ICD-10-CM | POA: Diagnosis not present

## 2018-01-02 DIAGNOSIS — D688 Other specified coagulation defects: Secondary | ICD-10-CM | POA: Diagnosis not present

## 2018-01-02 DIAGNOSIS — D631 Anemia in chronic kidney disease: Secondary | ICD-10-CM | POA: Diagnosis not present

## 2018-01-02 DIAGNOSIS — E039 Hypothyroidism, unspecified: Secondary | ICD-10-CM | POA: Diagnosis not present

## 2018-01-02 DIAGNOSIS — D509 Iron deficiency anemia, unspecified: Secondary | ICD-10-CM | POA: Diagnosis not present

## 2018-01-04 DIAGNOSIS — D631 Anemia in chronic kidney disease: Secondary | ICD-10-CM | POA: Diagnosis not present

## 2018-01-04 DIAGNOSIS — D688 Other specified coagulation defects: Secondary | ICD-10-CM | POA: Diagnosis not present

## 2018-01-04 DIAGNOSIS — N186 End stage renal disease: Secondary | ICD-10-CM | POA: Diagnosis not present

## 2018-01-04 DIAGNOSIS — E039 Hypothyroidism, unspecified: Secondary | ICD-10-CM | POA: Diagnosis not present

## 2018-01-04 DIAGNOSIS — N2581 Secondary hyperparathyroidism of renal origin: Secondary | ICD-10-CM | POA: Diagnosis not present

## 2018-01-04 DIAGNOSIS — I482 Chronic atrial fibrillation: Secondary | ICD-10-CM | POA: Diagnosis not present

## 2018-01-04 DIAGNOSIS — Z5181 Encounter for therapeutic drug level monitoring: Secondary | ICD-10-CM | POA: Diagnosis not present

## 2018-01-04 DIAGNOSIS — D509 Iron deficiency anemia, unspecified: Secondary | ICD-10-CM | POA: Diagnosis not present

## 2018-01-06 DIAGNOSIS — I482 Chronic atrial fibrillation: Secondary | ICD-10-CM | POA: Diagnosis not present

## 2018-01-06 DIAGNOSIS — Z5181 Encounter for therapeutic drug level monitoring: Secondary | ICD-10-CM | POA: Diagnosis not present

## 2018-01-06 DIAGNOSIS — N186 End stage renal disease: Secondary | ICD-10-CM | POA: Diagnosis not present

## 2018-01-06 DIAGNOSIS — E039 Hypothyroidism, unspecified: Secondary | ICD-10-CM | POA: Diagnosis not present

## 2018-01-06 DIAGNOSIS — D688 Other specified coagulation defects: Secondary | ICD-10-CM | POA: Diagnosis not present

## 2018-01-06 DIAGNOSIS — N2581 Secondary hyperparathyroidism of renal origin: Secondary | ICD-10-CM | POA: Diagnosis not present

## 2018-01-06 DIAGNOSIS — D509 Iron deficiency anemia, unspecified: Secondary | ICD-10-CM | POA: Diagnosis not present

## 2018-01-06 DIAGNOSIS — D631 Anemia in chronic kidney disease: Secondary | ICD-10-CM | POA: Diagnosis not present

## 2018-01-09 DIAGNOSIS — D509 Iron deficiency anemia, unspecified: Secondary | ICD-10-CM | POA: Diagnosis not present

## 2018-01-09 DIAGNOSIS — D631 Anemia in chronic kidney disease: Secondary | ICD-10-CM | POA: Diagnosis not present

## 2018-01-09 DIAGNOSIS — D688 Other specified coagulation defects: Secondary | ICD-10-CM | POA: Diagnosis not present

## 2018-01-09 DIAGNOSIS — N186 End stage renal disease: Secondary | ICD-10-CM | POA: Diagnosis not present

## 2018-01-09 DIAGNOSIS — E039 Hypothyroidism, unspecified: Secondary | ICD-10-CM | POA: Diagnosis not present

## 2018-01-09 DIAGNOSIS — N2581 Secondary hyperparathyroidism of renal origin: Secondary | ICD-10-CM | POA: Diagnosis not present

## 2018-01-09 DIAGNOSIS — Z5181 Encounter for therapeutic drug level monitoring: Secondary | ICD-10-CM | POA: Diagnosis not present

## 2018-01-09 DIAGNOSIS — I482 Chronic atrial fibrillation: Secondary | ICD-10-CM | POA: Diagnosis not present

## 2018-01-10 ENCOUNTER — Ambulatory Visit: Payer: Medicare Other | Admitting: *Deleted

## 2018-01-10 DIAGNOSIS — I480 Paroxysmal atrial fibrillation: Secondary | ICD-10-CM | POA: Diagnosis not present

## 2018-01-10 DIAGNOSIS — I4891 Unspecified atrial fibrillation: Secondary | ICD-10-CM

## 2018-01-10 DIAGNOSIS — Z5181 Encounter for therapeutic drug level monitoring: Secondary | ICD-10-CM | POA: Diagnosis not present

## 2018-01-10 LAB — POCT INR: INR: 2.8 (ref 2.0–3.0)

## 2018-01-10 NOTE — Patient Instructions (Signed)
Description   Continue taking 1/2 tablet daily except 1 tablet on Monday, Wednesday, and Friday. Recheck INR in 3 weeks Call with any new medications or if scheduled for any procedures or with any questions [phone]

## 2018-01-11 DIAGNOSIS — E039 Hypothyroidism, unspecified: Secondary | ICD-10-CM | POA: Diagnosis not present

## 2018-01-11 DIAGNOSIS — N186 End stage renal disease: Secondary | ICD-10-CM | POA: Diagnosis not present

## 2018-01-11 DIAGNOSIS — Z5181 Encounter for therapeutic drug level monitoring: Secondary | ICD-10-CM | POA: Diagnosis not present

## 2018-01-11 DIAGNOSIS — N2581 Secondary hyperparathyroidism of renal origin: Secondary | ICD-10-CM | POA: Diagnosis not present

## 2018-01-11 DIAGNOSIS — I482 Chronic atrial fibrillation: Secondary | ICD-10-CM | POA: Diagnosis not present

## 2018-01-11 DIAGNOSIS — D631 Anemia in chronic kidney disease: Secondary | ICD-10-CM | POA: Diagnosis not present

## 2018-01-11 DIAGNOSIS — D688 Other specified coagulation defects: Secondary | ICD-10-CM | POA: Diagnosis not present

## 2018-01-11 DIAGNOSIS — D509 Iron deficiency anemia, unspecified: Secondary | ICD-10-CM | POA: Diagnosis not present

## 2018-01-13 DIAGNOSIS — N186 End stage renal disease: Secondary | ICD-10-CM | POA: Diagnosis not present

## 2018-01-13 DIAGNOSIS — N2581 Secondary hyperparathyroidism of renal origin: Secondary | ICD-10-CM | POA: Diagnosis not present

## 2018-01-13 DIAGNOSIS — D688 Other specified coagulation defects: Secondary | ICD-10-CM | POA: Diagnosis not present

## 2018-01-13 DIAGNOSIS — I482 Chronic atrial fibrillation: Secondary | ICD-10-CM | POA: Diagnosis not present

## 2018-01-13 DIAGNOSIS — E039 Hypothyroidism, unspecified: Secondary | ICD-10-CM | POA: Diagnosis not present

## 2018-01-13 DIAGNOSIS — D509 Iron deficiency anemia, unspecified: Secondary | ICD-10-CM | POA: Diagnosis not present

## 2018-01-13 DIAGNOSIS — Z5181 Encounter for therapeutic drug level monitoring: Secondary | ICD-10-CM | POA: Diagnosis not present

## 2018-01-13 DIAGNOSIS — D631 Anemia in chronic kidney disease: Secondary | ICD-10-CM | POA: Diagnosis not present

## 2018-01-14 ENCOUNTER — Other Ambulatory Visit: Payer: Self-pay | Admitting: Interventional Cardiology

## 2018-01-16 DIAGNOSIS — D688 Other specified coagulation defects: Secondary | ICD-10-CM | POA: Diagnosis not present

## 2018-01-16 DIAGNOSIS — D509 Iron deficiency anemia, unspecified: Secondary | ICD-10-CM | POA: Diagnosis not present

## 2018-01-16 DIAGNOSIS — I482 Chronic atrial fibrillation: Secondary | ICD-10-CM | POA: Diagnosis not present

## 2018-01-16 DIAGNOSIS — N2581 Secondary hyperparathyroidism of renal origin: Secondary | ICD-10-CM | POA: Diagnosis not present

## 2018-01-16 DIAGNOSIS — D631 Anemia in chronic kidney disease: Secondary | ICD-10-CM | POA: Diagnosis not present

## 2018-01-16 DIAGNOSIS — Z5181 Encounter for therapeutic drug level monitoring: Secondary | ICD-10-CM | POA: Diagnosis not present

## 2018-01-16 DIAGNOSIS — E039 Hypothyroidism, unspecified: Secondary | ICD-10-CM | POA: Diagnosis not present

## 2018-01-16 DIAGNOSIS — N186 End stage renal disease: Secondary | ICD-10-CM | POA: Diagnosis not present

## 2018-01-17 DIAGNOSIS — I129 Hypertensive chronic kidney disease with stage 1 through stage 4 chronic kidney disease, or unspecified chronic kidney disease: Secondary | ICD-10-CM | POA: Diagnosis not present

## 2018-01-17 DIAGNOSIS — Z992 Dependence on renal dialysis: Secondary | ICD-10-CM | POA: Diagnosis not present

## 2018-01-17 DIAGNOSIS — N186 End stage renal disease: Secondary | ICD-10-CM | POA: Diagnosis not present

## 2018-01-18 DIAGNOSIS — Z5181 Encounter for therapeutic drug level monitoring: Secondary | ICD-10-CM | POA: Diagnosis not present

## 2018-01-18 DIAGNOSIS — N186 End stage renal disease: Secondary | ICD-10-CM | POA: Diagnosis not present

## 2018-01-18 DIAGNOSIS — N2581 Secondary hyperparathyroidism of renal origin: Secondary | ICD-10-CM | POA: Diagnosis not present

## 2018-01-18 DIAGNOSIS — I482 Chronic atrial fibrillation: Secondary | ICD-10-CM | POA: Diagnosis not present

## 2018-01-18 DIAGNOSIS — E039 Hypothyroidism, unspecified: Secondary | ICD-10-CM | POA: Diagnosis not present

## 2018-01-18 DIAGNOSIS — D509 Iron deficiency anemia, unspecified: Secondary | ICD-10-CM | POA: Diagnosis not present

## 2018-01-18 DIAGNOSIS — D688 Other specified coagulation defects: Secondary | ICD-10-CM | POA: Diagnosis not present

## 2018-01-20 DIAGNOSIS — N2581 Secondary hyperparathyroidism of renal origin: Secondary | ICD-10-CM | POA: Diagnosis not present

## 2018-01-20 DIAGNOSIS — I482 Chronic atrial fibrillation: Secondary | ICD-10-CM | POA: Diagnosis not present

## 2018-01-20 DIAGNOSIS — D688 Other specified coagulation defects: Secondary | ICD-10-CM | POA: Diagnosis not present

## 2018-01-20 DIAGNOSIS — N186 End stage renal disease: Secondary | ICD-10-CM | POA: Diagnosis not present

## 2018-01-20 DIAGNOSIS — D509 Iron deficiency anemia, unspecified: Secondary | ICD-10-CM | POA: Diagnosis not present

## 2018-01-20 DIAGNOSIS — Z5181 Encounter for therapeutic drug level monitoring: Secondary | ICD-10-CM | POA: Diagnosis not present

## 2018-01-20 DIAGNOSIS — E039 Hypothyroidism, unspecified: Secondary | ICD-10-CM | POA: Diagnosis not present

## 2018-01-22 DIAGNOSIS — R8271 Bacteriuria: Secondary | ICD-10-CM | POA: Diagnosis not present

## 2018-01-22 DIAGNOSIS — N130 Hydronephrosis with ureteropelvic junction obstruction: Secondary | ICD-10-CM | POA: Diagnosis not present

## 2018-01-23 DIAGNOSIS — E039 Hypothyroidism, unspecified: Secondary | ICD-10-CM | POA: Diagnosis not present

## 2018-01-23 DIAGNOSIS — N186 End stage renal disease: Secondary | ICD-10-CM | POA: Diagnosis not present

## 2018-01-23 DIAGNOSIS — N2581 Secondary hyperparathyroidism of renal origin: Secondary | ICD-10-CM | POA: Diagnosis not present

## 2018-01-23 DIAGNOSIS — Z5181 Encounter for therapeutic drug level monitoring: Secondary | ICD-10-CM | POA: Diagnosis not present

## 2018-01-23 DIAGNOSIS — D688 Other specified coagulation defects: Secondary | ICD-10-CM | POA: Diagnosis not present

## 2018-01-23 DIAGNOSIS — D509 Iron deficiency anemia, unspecified: Secondary | ICD-10-CM | POA: Diagnosis not present

## 2018-01-23 DIAGNOSIS — I482 Chronic atrial fibrillation: Secondary | ICD-10-CM | POA: Diagnosis not present

## 2018-01-25 DIAGNOSIS — D509 Iron deficiency anemia, unspecified: Secondary | ICD-10-CM | POA: Diagnosis not present

## 2018-01-25 DIAGNOSIS — D688 Other specified coagulation defects: Secondary | ICD-10-CM | POA: Diagnosis not present

## 2018-01-25 DIAGNOSIS — N2581 Secondary hyperparathyroidism of renal origin: Secondary | ICD-10-CM | POA: Diagnosis not present

## 2018-01-25 DIAGNOSIS — I482 Chronic atrial fibrillation: Secondary | ICD-10-CM | POA: Diagnosis not present

## 2018-01-25 DIAGNOSIS — E039 Hypothyroidism, unspecified: Secondary | ICD-10-CM | POA: Diagnosis not present

## 2018-01-25 DIAGNOSIS — Z5181 Encounter for therapeutic drug level monitoring: Secondary | ICD-10-CM | POA: Diagnosis not present

## 2018-01-25 DIAGNOSIS — N186 End stage renal disease: Secondary | ICD-10-CM | POA: Diagnosis not present

## 2018-01-27 DIAGNOSIS — N2581 Secondary hyperparathyroidism of renal origin: Secondary | ICD-10-CM | POA: Diagnosis not present

## 2018-01-27 DIAGNOSIS — D688 Other specified coagulation defects: Secondary | ICD-10-CM | POA: Diagnosis not present

## 2018-01-27 DIAGNOSIS — E039 Hypothyroidism, unspecified: Secondary | ICD-10-CM | POA: Diagnosis not present

## 2018-01-27 DIAGNOSIS — Z5181 Encounter for therapeutic drug level monitoring: Secondary | ICD-10-CM | POA: Diagnosis not present

## 2018-01-27 DIAGNOSIS — N186 End stage renal disease: Secondary | ICD-10-CM | POA: Diagnosis not present

## 2018-01-27 DIAGNOSIS — D509 Iron deficiency anemia, unspecified: Secondary | ICD-10-CM | POA: Diagnosis not present

## 2018-01-27 DIAGNOSIS — I482 Chronic atrial fibrillation: Secondary | ICD-10-CM | POA: Diagnosis not present

## 2018-01-30 DIAGNOSIS — Z5181 Encounter for therapeutic drug level monitoring: Secondary | ICD-10-CM | POA: Diagnosis not present

## 2018-01-30 DIAGNOSIS — N2581 Secondary hyperparathyroidism of renal origin: Secondary | ICD-10-CM | POA: Diagnosis not present

## 2018-01-30 DIAGNOSIS — D509 Iron deficiency anemia, unspecified: Secondary | ICD-10-CM | POA: Diagnosis not present

## 2018-01-30 DIAGNOSIS — D688 Other specified coagulation defects: Secondary | ICD-10-CM | POA: Diagnosis not present

## 2018-01-30 DIAGNOSIS — N186 End stage renal disease: Secondary | ICD-10-CM | POA: Diagnosis not present

## 2018-01-30 DIAGNOSIS — I482 Chronic atrial fibrillation: Secondary | ICD-10-CM | POA: Diagnosis not present

## 2018-01-30 DIAGNOSIS — E039 Hypothyroidism, unspecified: Secondary | ICD-10-CM | POA: Diagnosis not present

## 2018-01-31 ENCOUNTER — Ambulatory Visit: Payer: Medicare Other | Admitting: Pharmacist

## 2018-01-31 DIAGNOSIS — Z5181 Encounter for therapeutic drug level monitoring: Secondary | ICD-10-CM | POA: Diagnosis not present

## 2018-01-31 DIAGNOSIS — I4891 Unspecified atrial fibrillation: Secondary | ICD-10-CM

## 2018-01-31 DIAGNOSIS — I480 Paroxysmal atrial fibrillation: Secondary | ICD-10-CM

## 2018-01-31 LAB — POCT INR: INR: 2.3 (ref 2.0–3.0)

## 2018-01-31 NOTE — Patient Instructions (Signed)
Description   Continue taking 1/2 tablet daily except 1 tablet on Monday, Wednesday, and Friday. Recheck INR in 5 weeks Call with any new medications or if scheduled for any procedures or with any questions [phone]

## 2018-02-01 DIAGNOSIS — N2581 Secondary hyperparathyroidism of renal origin: Secondary | ICD-10-CM | POA: Diagnosis not present

## 2018-02-01 DIAGNOSIS — E039 Hypothyroidism, unspecified: Secondary | ICD-10-CM | POA: Diagnosis not present

## 2018-02-01 DIAGNOSIS — Z5181 Encounter for therapeutic drug level monitoring: Secondary | ICD-10-CM | POA: Diagnosis not present

## 2018-02-01 DIAGNOSIS — D688 Other specified coagulation defects: Secondary | ICD-10-CM | POA: Diagnosis not present

## 2018-02-01 DIAGNOSIS — D509 Iron deficiency anemia, unspecified: Secondary | ICD-10-CM | POA: Diagnosis not present

## 2018-02-01 DIAGNOSIS — I482 Chronic atrial fibrillation: Secondary | ICD-10-CM | POA: Diagnosis not present

## 2018-02-01 DIAGNOSIS — N186 End stage renal disease: Secondary | ICD-10-CM | POA: Diagnosis not present

## 2018-02-03 DIAGNOSIS — N2581 Secondary hyperparathyroidism of renal origin: Secondary | ICD-10-CM | POA: Diagnosis not present

## 2018-02-03 DIAGNOSIS — Z5181 Encounter for therapeutic drug level monitoring: Secondary | ICD-10-CM | POA: Diagnosis not present

## 2018-02-03 DIAGNOSIS — E039 Hypothyroidism, unspecified: Secondary | ICD-10-CM | POA: Diagnosis not present

## 2018-02-03 DIAGNOSIS — N186 End stage renal disease: Secondary | ICD-10-CM | POA: Diagnosis not present

## 2018-02-03 DIAGNOSIS — D688 Other specified coagulation defects: Secondary | ICD-10-CM | POA: Diagnosis not present

## 2018-02-03 DIAGNOSIS — D509 Iron deficiency anemia, unspecified: Secondary | ICD-10-CM | POA: Diagnosis not present

## 2018-02-03 DIAGNOSIS — I482 Chronic atrial fibrillation: Secondary | ICD-10-CM | POA: Diagnosis not present

## 2018-02-05 DIAGNOSIS — I480 Paroxysmal atrial fibrillation: Secondary | ICD-10-CM | POA: Diagnosis not present

## 2018-02-05 DIAGNOSIS — I129 Hypertensive chronic kidney disease with stage 1 through stage 4 chronic kidney disease, or unspecified chronic kidney disease: Secondary | ICD-10-CM | POA: Diagnosis not present

## 2018-02-05 DIAGNOSIS — E781 Pure hyperglyceridemia: Secondary | ICD-10-CM | POA: Diagnosis not present

## 2018-02-05 DIAGNOSIS — Z0000 Encounter for general adult medical examination without abnormal findings: Secondary | ICD-10-CM | POA: Diagnosis not present

## 2018-02-06 DIAGNOSIS — I482 Chronic atrial fibrillation: Secondary | ICD-10-CM | POA: Diagnosis not present

## 2018-02-06 DIAGNOSIS — D688 Other specified coagulation defects: Secondary | ICD-10-CM | POA: Diagnosis not present

## 2018-02-06 DIAGNOSIS — Z5181 Encounter for therapeutic drug level monitoring: Secondary | ICD-10-CM | POA: Diagnosis not present

## 2018-02-06 DIAGNOSIS — N186 End stage renal disease: Secondary | ICD-10-CM | POA: Diagnosis not present

## 2018-02-06 DIAGNOSIS — D509 Iron deficiency anemia, unspecified: Secondary | ICD-10-CM | POA: Diagnosis not present

## 2018-02-06 DIAGNOSIS — N2581 Secondary hyperparathyroidism of renal origin: Secondary | ICD-10-CM | POA: Diagnosis not present

## 2018-02-06 DIAGNOSIS — E039 Hypothyroidism, unspecified: Secondary | ICD-10-CM | POA: Diagnosis not present

## 2018-02-08 DIAGNOSIS — D688 Other specified coagulation defects: Secondary | ICD-10-CM | POA: Diagnosis not present

## 2018-02-08 DIAGNOSIS — N186 End stage renal disease: Secondary | ICD-10-CM | POA: Diagnosis not present

## 2018-02-08 DIAGNOSIS — Z5181 Encounter for therapeutic drug level monitoring: Secondary | ICD-10-CM | POA: Diagnosis not present

## 2018-02-08 DIAGNOSIS — D509 Iron deficiency anemia, unspecified: Secondary | ICD-10-CM | POA: Diagnosis not present

## 2018-02-08 DIAGNOSIS — I482 Chronic atrial fibrillation: Secondary | ICD-10-CM | POA: Diagnosis not present

## 2018-02-08 DIAGNOSIS — E039 Hypothyroidism, unspecified: Secondary | ICD-10-CM | POA: Diagnosis not present

## 2018-02-08 DIAGNOSIS — N2581 Secondary hyperparathyroidism of renal origin: Secondary | ICD-10-CM | POA: Diagnosis not present

## 2018-02-10 DIAGNOSIS — I482 Chronic atrial fibrillation: Secondary | ICD-10-CM | POA: Diagnosis not present

## 2018-02-10 DIAGNOSIS — E039 Hypothyroidism, unspecified: Secondary | ICD-10-CM | POA: Diagnosis not present

## 2018-02-10 DIAGNOSIS — N186 End stage renal disease: Secondary | ICD-10-CM | POA: Diagnosis not present

## 2018-02-10 DIAGNOSIS — D509 Iron deficiency anemia, unspecified: Secondary | ICD-10-CM | POA: Diagnosis not present

## 2018-02-10 DIAGNOSIS — D688 Other specified coagulation defects: Secondary | ICD-10-CM | POA: Diagnosis not present

## 2018-02-10 DIAGNOSIS — Z5181 Encounter for therapeutic drug level monitoring: Secondary | ICD-10-CM | POA: Diagnosis not present

## 2018-02-10 DIAGNOSIS — N2581 Secondary hyperparathyroidism of renal origin: Secondary | ICD-10-CM | POA: Diagnosis not present

## 2018-02-13 DIAGNOSIS — N2581 Secondary hyperparathyroidism of renal origin: Secondary | ICD-10-CM | POA: Diagnosis not present

## 2018-02-13 DIAGNOSIS — D509 Iron deficiency anemia, unspecified: Secondary | ICD-10-CM | POA: Diagnosis not present

## 2018-02-13 DIAGNOSIS — Z5181 Encounter for therapeutic drug level monitoring: Secondary | ICD-10-CM | POA: Diagnosis not present

## 2018-02-13 DIAGNOSIS — I482 Chronic atrial fibrillation: Secondary | ICD-10-CM | POA: Diagnosis not present

## 2018-02-13 DIAGNOSIS — N186 End stage renal disease: Secondary | ICD-10-CM | POA: Diagnosis not present

## 2018-02-13 DIAGNOSIS — E039 Hypothyroidism, unspecified: Secondary | ICD-10-CM | POA: Diagnosis not present

## 2018-02-13 DIAGNOSIS — D688 Other specified coagulation defects: Secondary | ICD-10-CM | POA: Diagnosis not present

## 2018-02-15 ENCOUNTER — Telehealth: Payer: Self-pay | Admitting: Interventional Cardiology

## 2018-02-15 DIAGNOSIS — D509 Iron deficiency anemia, unspecified: Secondary | ICD-10-CM | POA: Diagnosis not present

## 2018-02-15 DIAGNOSIS — N186 End stage renal disease: Secondary | ICD-10-CM | POA: Diagnosis not present

## 2018-02-15 DIAGNOSIS — Z5181 Encounter for therapeutic drug level monitoring: Secondary | ICD-10-CM | POA: Diagnosis not present

## 2018-02-15 DIAGNOSIS — I482 Chronic atrial fibrillation: Secondary | ICD-10-CM | POA: Diagnosis not present

## 2018-02-15 DIAGNOSIS — D688 Other specified coagulation defects: Secondary | ICD-10-CM | POA: Diagnosis not present

## 2018-02-15 DIAGNOSIS — E039 Hypothyroidism, unspecified: Secondary | ICD-10-CM | POA: Diagnosis not present

## 2018-02-15 DIAGNOSIS — N2581 Secondary hyperparathyroidism of renal origin: Secondary | ICD-10-CM | POA: Diagnosis not present

## 2018-02-15 NOTE — Telephone Encounter (Signed)
Returned call to patient to offer appointment for 9/5. Patient has dialysis on T/TH and cannot have appointments on those days. Patient denies having any complaints at this time. Patient will keep appointment for 11/1.

## 2018-02-15 NOTE — Telephone Encounter (Signed)
Patient returning call to nurse  

## 2018-02-17 DIAGNOSIS — Z5181 Encounter for therapeutic drug level monitoring: Secondary | ICD-10-CM | POA: Diagnosis not present

## 2018-02-17 DIAGNOSIS — D509 Iron deficiency anemia, unspecified: Secondary | ICD-10-CM | POA: Diagnosis not present

## 2018-02-17 DIAGNOSIS — I129 Hypertensive chronic kidney disease with stage 1 through stage 4 chronic kidney disease, or unspecified chronic kidney disease: Secondary | ICD-10-CM | POA: Diagnosis not present

## 2018-02-17 DIAGNOSIS — E039 Hypothyroidism, unspecified: Secondary | ICD-10-CM | POA: Diagnosis not present

## 2018-02-17 DIAGNOSIS — D688 Other specified coagulation defects: Secondary | ICD-10-CM | POA: Diagnosis not present

## 2018-02-17 DIAGNOSIS — N186 End stage renal disease: Secondary | ICD-10-CM | POA: Diagnosis not present

## 2018-02-17 DIAGNOSIS — Z992 Dependence on renal dialysis: Secondary | ICD-10-CM | POA: Diagnosis not present

## 2018-02-17 DIAGNOSIS — I482 Chronic atrial fibrillation: Secondary | ICD-10-CM | POA: Diagnosis not present

## 2018-02-17 DIAGNOSIS — N2581 Secondary hyperparathyroidism of renal origin: Secondary | ICD-10-CM | POA: Diagnosis not present

## 2018-02-20 DIAGNOSIS — D688 Other specified coagulation defects: Secondary | ICD-10-CM | POA: Diagnosis not present

## 2018-02-20 DIAGNOSIS — Z5181 Encounter for therapeutic drug level monitoring: Secondary | ICD-10-CM | POA: Diagnosis not present

## 2018-02-20 DIAGNOSIS — R51 Headache: Secondary | ICD-10-CM | POA: Diagnosis not present

## 2018-02-20 DIAGNOSIS — D631 Anemia in chronic kidney disease: Secondary | ICD-10-CM | POA: Diagnosis not present

## 2018-02-20 DIAGNOSIS — I482 Chronic atrial fibrillation: Secondary | ICD-10-CM | POA: Diagnosis not present

## 2018-02-20 DIAGNOSIS — D509 Iron deficiency anemia, unspecified: Secondary | ICD-10-CM | POA: Diagnosis not present

## 2018-02-20 DIAGNOSIS — N2581 Secondary hyperparathyroidism of renal origin: Secondary | ICD-10-CM | POA: Diagnosis not present

## 2018-02-20 DIAGNOSIS — N186 End stage renal disease: Secondary | ICD-10-CM | POA: Diagnosis not present

## 2018-02-20 DIAGNOSIS — E039 Hypothyroidism, unspecified: Secondary | ICD-10-CM | POA: Diagnosis not present

## 2018-02-22 DIAGNOSIS — D688 Other specified coagulation defects: Secondary | ICD-10-CM | POA: Diagnosis not present

## 2018-02-22 DIAGNOSIS — I482 Chronic atrial fibrillation: Secondary | ICD-10-CM | POA: Diagnosis not present

## 2018-02-22 DIAGNOSIS — R51 Headache: Secondary | ICD-10-CM | POA: Diagnosis not present

## 2018-02-22 DIAGNOSIS — N186 End stage renal disease: Secondary | ICD-10-CM | POA: Diagnosis not present

## 2018-02-22 DIAGNOSIS — Z5181 Encounter for therapeutic drug level monitoring: Secondary | ICD-10-CM | POA: Diagnosis not present

## 2018-02-22 DIAGNOSIS — D631 Anemia in chronic kidney disease: Secondary | ICD-10-CM | POA: Diagnosis not present

## 2018-02-22 DIAGNOSIS — E039 Hypothyroidism, unspecified: Secondary | ICD-10-CM | POA: Diagnosis not present

## 2018-02-22 DIAGNOSIS — D509 Iron deficiency anemia, unspecified: Secondary | ICD-10-CM | POA: Diagnosis not present

## 2018-02-22 DIAGNOSIS — N2581 Secondary hyperparathyroidism of renal origin: Secondary | ICD-10-CM | POA: Diagnosis not present

## 2018-02-24 DIAGNOSIS — D509 Iron deficiency anemia, unspecified: Secondary | ICD-10-CM | POA: Diagnosis not present

## 2018-02-24 DIAGNOSIS — I482 Chronic atrial fibrillation: Secondary | ICD-10-CM | POA: Diagnosis not present

## 2018-02-24 DIAGNOSIS — D688 Other specified coagulation defects: Secondary | ICD-10-CM | POA: Diagnosis not present

## 2018-02-24 DIAGNOSIS — E039 Hypothyroidism, unspecified: Secondary | ICD-10-CM | POA: Diagnosis not present

## 2018-02-24 DIAGNOSIS — D631 Anemia in chronic kidney disease: Secondary | ICD-10-CM | POA: Diagnosis not present

## 2018-02-24 DIAGNOSIS — N186 End stage renal disease: Secondary | ICD-10-CM | POA: Diagnosis not present

## 2018-02-24 DIAGNOSIS — R51 Headache: Secondary | ICD-10-CM | POA: Diagnosis not present

## 2018-02-24 DIAGNOSIS — N2581 Secondary hyperparathyroidism of renal origin: Secondary | ICD-10-CM | POA: Diagnosis not present

## 2018-02-24 DIAGNOSIS — Z5181 Encounter for therapeutic drug level monitoring: Secondary | ICD-10-CM | POA: Diagnosis not present

## 2018-02-27 DIAGNOSIS — D688 Other specified coagulation defects: Secondary | ICD-10-CM | POA: Diagnosis not present

## 2018-02-27 DIAGNOSIS — E039 Hypothyroidism, unspecified: Secondary | ICD-10-CM | POA: Diagnosis not present

## 2018-02-27 DIAGNOSIS — N2581 Secondary hyperparathyroidism of renal origin: Secondary | ICD-10-CM | POA: Diagnosis not present

## 2018-02-27 DIAGNOSIS — N186 End stage renal disease: Secondary | ICD-10-CM | POA: Diagnosis not present

## 2018-02-27 DIAGNOSIS — D631 Anemia in chronic kidney disease: Secondary | ICD-10-CM | POA: Diagnosis not present

## 2018-02-27 DIAGNOSIS — Z5181 Encounter for therapeutic drug level monitoring: Secondary | ICD-10-CM | POA: Diagnosis not present

## 2018-02-27 DIAGNOSIS — D509 Iron deficiency anemia, unspecified: Secondary | ICD-10-CM | POA: Diagnosis not present

## 2018-02-27 DIAGNOSIS — R51 Headache: Secondary | ICD-10-CM | POA: Diagnosis not present

## 2018-02-27 DIAGNOSIS — I482 Chronic atrial fibrillation: Secondary | ICD-10-CM | POA: Diagnosis not present

## 2018-03-01 DIAGNOSIS — E039 Hypothyroidism, unspecified: Secondary | ICD-10-CM | POA: Diagnosis not present

## 2018-03-01 DIAGNOSIS — R51 Headache: Secondary | ICD-10-CM | POA: Diagnosis not present

## 2018-03-01 DIAGNOSIS — D509 Iron deficiency anemia, unspecified: Secondary | ICD-10-CM | POA: Diagnosis not present

## 2018-03-01 DIAGNOSIS — D688 Other specified coagulation defects: Secondary | ICD-10-CM | POA: Diagnosis not present

## 2018-03-01 DIAGNOSIS — N2581 Secondary hyperparathyroidism of renal origin: Secondary | ICD-10-CM | POA: Diagnosis not present

## 2018-03-01 DIAGNOSIS — D631 Anemia in chronic kidney disease: Secondary | ICD-10-CM | POA: Diagnosis not present

## 2018-03-01 DIAGNOSIS — I482 Chronic atrial fibrillation: Secondary | ICD-10-CM | POA: Diagnosis not present

## 2018-03-01 DIAGNOSIS — N186 End stage renal disease: Secondary | ICD-10-CM | POA: Diagnosis not present

## 2018-03-01 DIAGNOSIS — Z5181 Encounter for therapeutic drug level monitoring: Secondary | ICD-10-CM | POA: Diagnosis not present

## 2018-03-03 DIAGNOSIS — E039 Hypothyroidism, unspecified: Secondary | ICD-10-CM | POA: Diagnosis not present

## 2018-03-03 DIAGNOSIS — R51 Headache: Secondary | ICD-10-CM | POA: Diagnosis not present

## 2018-03-03 DIAGNOSIS — D509 Iron deficiency anemia, unspecified: Secondary | ICD-10-CM | POA: Diagnosis not present

## 2018-03-03 DIAGNOSIS — Z5181 Encounter for therapeutic drug level monitoring: Secondary | ICD-10-CM | POA: Diagnosis not present

## 2018-03-03 DIAGNOSIS — N2581 Secondary hyperparathyroidism of renal origin: Secondary | ICD-10-CM | POA: Diagnosis not present

## 2018-03-03 DIAGNOSIS — D631 Anemia in chronic kidney disease: Secondary | ICD-10-CM | POA: Diagnosis not present

## 2018-03-03 DIAGNOSIS — N186 End stage renal disease: Secondary | ICD-10-CM | POA: Diagnosis not present

## 2018-03-03 DIAGNOSIS — D688 Other specified coagulation defects: Secondary | ICD-10-CM | POA: Diagnosis not present

## 2018-03-03 DIAGNOSIS — I482 Chronic atrial fibrillation: Secondary | ICD-10-CM | POA: Diagnosis not present

## 2018-03-05 DIAGNOSIS — M810 Age-related osteoporosis without current pathological fracture: Secondary | ICD-10-CM | POA: Diagnosis not present

## 2018-03-05 DIAGNOSIS — M8588 Other specified disorders of bone density and structure, other site: Secondary | ICD-10-CM | POA: Diagnosis not present

## 2018-03-06 DIAGNOSIS — Z5181 Encounter for therapeutic drug level monitoring: Secondary | ICD-10-CM | POA: Diagnosis not present

## 2018-03-06 DIAGNOSIS — R51 Headache: Secondary | ICD-10-CM | POA: Diagnosis not present

## 2018-03-06 DIAGNOSIS — D631 Anemia in chronic kidney disease: Secondary | ICD-10-CM | POA: Diagnosis not present

## 2018-03-06 DIAGNOSIS — E039 Hypothyroidism, unspecified: Secondary | ICD-10-CM | POA: Diagnosis not present

## 2018-03-06 DIAGNOSIS — N186 End stage renal disease: Secondary | ICD-10-CM | POA: Diagnosis not present

## 2018-03-06 DIAGNOSIS — D509 Iron deficiency anemia, unspecified: Secondary | ICD-10-CM | POA: Diagnosis not present

## 2018-03-06 DIAGNOSIS — I482 Chronic atrial fibrillation: Secondary | ICD-10-CM | POA: Diagnosis not present

## 2018-03-06 DIAGNOSIS — D688 Other specified coagulation defects: Secondary | ICD-10-CM | POA: Diagnosis not present

## 2018-03-06 DIAGNOSIS — N2581 Secondary hyperparathyroidism of renal origin: Secondary | ICD-10-CM | POA: Diagnosis not present

## 2018-03-08 DIAGNOSIS — R51 Headache: Secondary | ICD-10-CM | POA: Diagnosis not present

## 2018-03-08 DIAGNOSIS — N2581 Secondary hyperparathyroidism of renal origin: Secondary | ICD-10-CM | POA: Diagnosis not present

## 2018-03-08 DIAGNOSIS — D509 Iron deficiency anemia, unspecified: Secondary | ICD-10-CM | POA: Diagnosis not present

## 2018-03-08 DIAGNOSIS — I482 Chronic atrial fibrillation: Secondary | ICD-10-CM | POA: Diagnosis not present

## 2018-03-08 DIAGNOSIS — D631 Anemia in chronic kidney disease: Secondary | ICD-10-CM | POA: Diagnosis not present

## 2018-03-08 DIAGNOSIS — D688 Other specified coagulation defects: Secondary | ICD-10-CM | POA: Diagnosis not present

## 2018-03-08 DIAGNOSIS — N186 End stage renal disease: Secondary | ICD-10-CM | POA: Diagnosis not present

## 2018-03-08 DIAGNOSIS — Z5181 Encounter for therapeutic drug level monitoring: Secondary | ICD-10-CM | POA: Diagnosis not present

## 2018-03-08 DIAGNOSIS — E039 Hypothyroidism, unspecified: Secondary | ICD-10-CM | POA: Diagnosis not present

## 2018-03-09 ENCOUNTER — Ambulatory Visit: Payer: Medicare Other | Admitting: Interventional Cardiology

## 2018-03-09 ENCOUNTER — Ambulatory Visit: Payer: Medicare Other | Admitting: *Deleted

## 2018-03-09 DIAGNOSIS — Z5181 Encounter for therapeutic drug level monitoring: Secondary | ICD-10-CM | POA: Diagnosis not present

## 2018-03-09 DIAGNOSIS — I4891 Unspecified atrial fibrillation: Secondary | ICD-10-CM | POA: Diagnosis not present

## 2018-03-09 DIAGNOSIS — I480 Paroxysmal atrial fibrillation: Secondary | ICD-10-CM

## 2018-03-09 LAB — POCT INR: INR: 2.3 (ref 2.0–3.0)

## 2018-03-09 NOTE — Patient Instructions (Signed)
Description   Continue taking 1/2 tablet daily except 1 tablet on Monday, Wednesday, and Friday. Recheck INR in 6 weeks Call with any new medications or if scheduled for any procedures or with any questions [phone]

## 2018-03-10 DIAGNOSIS — D631 Anemia in chronic kidney disease: Secondary | ICD-10-CM | POA: Diagnosis not present

## 2018-03-10 DIAGNOSIS — Z5181 Encounter for therapeutic drug level monitoring: Secondary | ICD-10-CM | POA: Diagnosis not present

## 2018-03-10 DIAGNOSIS — I482 Chronic atrial fibrillation: Secondary | ICD-10-CM | POA: Diagnosis not present

## 2018-03-10 DIAGNOSIS — D688 Other specified coagulation defects: Secondary | ICD-10-CM | POA: Diagnosis not present

## 2018-03-10 DIAGNOSIS — D509 Iron deficiency anemia, unspecified: Secondary | ICD-10-CM | POA: Diagnosis not present

## 2018-03-10 DIAGNOSIS — N186 End stage renal disease: Secondary | ICD-10-CM | POA: Diagnosis not present

## 2018-03-10 DIAGNOSIS — N2581 Secondary hyperparathyroidism of renal origin: Secondary | ICD-10-CM | POA: Diagnosis not present

## 2018-03-10 DIAGNOSIS — R51 Headache: Secondary | ICD-10-CM | POA: Diagnosis not present

## 2018-03-10 DIAGNOSIS — E039 Hypothyroidism, unspecified: Secondary | ICD-10-CM | POA: Diagnosis not present

## 2018-03-13 DIAGNOSIS — R51 Headache: Secondary | ICD-10-CM | POA: Diagnosis not present

## 2018-03-13 DIAGNOSIS — N186 End stage renal disease: Secondary | ICD-10-CM | POA: Diagnosis not present

## 2018-03-13 DIAGNOSIS — Z5181 Encounter for therapeutic drug level monitoring: Secondary | ICD-10-CM | POA: Diagnosis not present

## 2018-03-13 DIAGNOSIS — D509 Iron deficiency anemia, unspecified: Secondary | ICD-10-CM | POA: Diagnosis not present

## 2018-03-13 DIAGNOSIS — I482 Chronic atrial fibrillation: Secondary | ICD-10-CM | POA: Diagnosis not present

## 2018-03-13 DIAGNOSIS — D631 Anemia in chronic kidney disease: Secondary | ICD-10-CM | POA: Diagnosis not present

## 2018-03-13 DIAGNOSIS — E039 Hypothyroidism, unspecified: Secondary | ICD-10-CM | POA: Diagnosis not present

## 2018-03-13 DIAGNOSIS — N2581 Secondary hyperparathyroidism of renal origin: Secondary | ICD-10-CM | POA: Diagnosis not present

## 2018-03-13 DIAGNOSIS — D688 Other specified coagulation defects: Secondary | ICD-10-CM | POA: Diagnosis not present

## 2018-03-15 DIAGNOSIS — D688 Other specified coagulation defects: Secondary | ICD-10-CM | POA: Diagnosis not present

## 2018-03-15 DIAGNOSIS — E039 Hypothyroidism, unspecified: Secondary | ICD-10-CM | POA: Diagnosis not present

## 2018-03-15 DIAGNOSIS — N2581 Secondary hyperparathyroidism of renal origin: Secondary | ICD-10-CM | POA: Diagnosis not present

## 2018-03-15 DIAGNOSIS — R51 Headache: Secondary | ICD-10-CM | POA: Diagnosis not present

## 2018-03-15 DIAGNOSIS — I482 Chronic atrial fibrillation: Secondary | ICD-10-CM | POA: Diagnosis not present

## 2018-03-15 DIAGNOSIS — N186 End stage renal disease: Secondary | ICD-10-CM | POA: Diagnosis not present

## 2018-03-15 DIAGNOSIS — D631 Anemia in chronic kidney disease: Secondary | ICD-10-CM | POA: Diagnosis not present

## 2018-03-15 DIAGNOSIS — Z5181 Encounter for therapeutic drug level monitoring: Secondary | ICD-10-CM | POA: Diagnosis not present

## 2018-03-15 DIAGNOSIS — D509 Iron deficiency anemia, unspecified: Secondary | ICD-10-CM | POA: Diagnosis not present

## 2018-03-17 DIAGNOSIS — D631 Anemia in chronic kidney disease: Secondary | ICD-10-CM | POA: Diagnosis not present

## 2018-03-17 DIAGNOSIS — R51 Headache: Secondary | ICD-10-CM | POA: Diagnosis not present

## 2018-03-17 DIAGNOSIS — N2581 Secondary hyperparathyroidism of renal origin: Secondary | ICD-10-CM | POA: Diagnosis not present

## 2018-03-17 DIAGNOSIS — I482 Chronic atrial fibrillation: Secondary | ICD-10-CM | POA: Diagnosis not present

## 2018-03-17 DIAGNOSIS — N186 End stage renal disease: Secondary | ICD-10-CM | POA: Diagnosis not present

## 2018-03-17 DIAGNOSIS — D509 Iron deficiency anemia, unspecified: Secondary | ICD-10-CM | POA: Diagnosis not present

## 2018-03-17 DIAGNOSIS — E039 Hypothyroidism, unspecified: Secondary | ICD-10-CM | POA: Diagnosis not present

## 2018-03-17 DIAGNOSIS — D688 Other specified coagulation defects: Secondary | ICD-10-CM | POA: Diagnosis not present

## 2018-03-17 DIAGNOSIS — Z5181 Encounter for therapeutic drug level monitoring: Secondary | ICD-10-CM | POA: Diagnosis not present

## 2018-03-19 DIAGNOSIS — M810 Age-related osteoporosis without current pathological fracture: Secondary | ICD-10-CM | POA: Diagnosis not present

## 2018-03-19 DIAGNOSIS — Z992 Dependence on renal dialysis: Secondary | ICD-10-CM | POA: Diagnosis not present

## 2018-03-19 DIAGNOSIS — N186 End stage renal disease: Secondary | ICD-10-CM | POA: Diagnosis not present

## 2018-03-19 DIAGNOSIS — Z23 Encounter for immunization: Secondary | ICD-10-CM | POA: Diagnosis not present

## 2018-03-19 DIAGNOSIS — I129 Hypertensive chronic kidney disease with stage 1 through stage 4 chronic kidney disease, or unspecified chronic kidney disease: Secondary | ICD-10-CM | POA: Diagnosis not present

## 2018-03-20 DIAGNOSIS — A4902 Methicillin resistant Staphylococcus aureus infection, unspecified site: Secondary | ICD-10-CM | POA: Diagnosis not present

## 2018-03-20 DIAGNOSIS — N186 End stage renal disease: Secondary | ICD-10-CM | POA: Diagnosis not present

## 2018-03-20 DIAGNOSIS — E039 Hypothyroidism, unspecified: Secondary | ICD-10-CM | POA: Diagnosis not present

## 2018-03-20 DIAGNOSIS — I482 Chronic atrial fibrillation, unspecified: Secondary | ICD-10-CM | POA: Diagnosis not present

## 2018-03-20 DIAGNOSIS — D688 Other specified coagulation defects: Secondary | ICD-10-CM | POA: Diagnosis not present

## 2018-03-20 DIAGNOSIS — D509 Iron deficiency anemia, unspecified: Secondary | ICD-10-CM | POA: Diagnosis not present

## 2018-03-20 DIAGNOSIS — N2581 Secondary hyperparathyroidism of renal origin: Secondary | ICD-10-CM | POA: Diagnosis not present

## 2018-03-20 DIAGNOSIS — Z5181 Encounter for therapeutic drug level monitoring: Secondary | ICD-10-CM | POA: Diagnosis not present

## 2018-03-22 DIAGNOSIS — I482 Chronic atrial fibrillation, unspecified: Secondary | ICD-10-CM | POA: Diagnosis not present

## 2018-03-22 DIAGNOSIS — A4902 Methicillin resistant Staphylococcus aureus infection, unspecified site: Secondary | ICD-10-CM | POA: Insufficient documentation

## 2018-03-22 DIAGNOSIS — N2581 Secondary hyperparathyroidism of renal origin: Secondary | ICD-10-CM | POA: Diagnosis not present

## 2018-03-22 DIAGNOSIS — D688 Other specified coagulation defects: Secondary | ICD-10-CM | POA: Diagnosis not present

## 2018-03-22 DIAGNOSIS — N186 End stage renal disease: Secondary | ICD-10-CM | POA: Diagnosis not present

## 2018-03-22 DIAGNOSIS — Z5181 Encounter for therapeutic drug level monitoring: Secondary | ICD-10-CM | POA: Diagnosis not present

## 2018-03-22 DIAGNOSIS — E039 Hypothyroidism, unspecified: Secondary | ICD-10-CM | POA: Diagnosis not present

## 2018-03-22 DIAGNOSIS — D509 Iron deficiency anemia, unspecified: Secondary | ICD-10-CM | POA: Diagnosis not present

## 2018-03-22 HISTORY — DX: Methicillin resistant Staphylococcus aureus infection, unspecified site: A49.02

## 2018-03-24 DIAGNOSIS — I482 Chronic atrial fibrillation, unspecified: Secondary | ICD-10-CM | POA: Diagnosis not present

## 2018-03-24 DIAGNOSIS — D509 Iron deficiency anemia, unspecified: Secondary | ICD-10-CM | POA: Diagnosis not present

## 2018-03-24 DIAGNOSIS — N2581 Secondary hyperparathyroidism of renal origin: Secondary | ICD-10-CM | POA: Diagnosis not present

## 2018-03-24 DIAGNOSIS — E039 Hypothyroidism, unspecified: Secondary | ICD-10-CM | POA: Diagnosis not present

## 2018-03-24 DIAGNOSIS — Z5181 Encounter for therapeutic drug level monitoring: Secondary | ICD-10-CM | POA: Diagnosis not present

## 2018-03-24 DIAGNOSIS — A4902 Methicillin resistant Staphylococcus aureus infection, unspecified site: Secondary | ICD-10-CM | POA: Diagnosis not present

## 2018-03-24 DIAGNOSIS — N186 End stage renal disease: Secondary | ICD-10-CM | POA: Diagnosis not present

## 2018-03-24 DIAGNOSIS — D688 Other specified coagulation defects: Secondary | ICD-10-CM | POA: Diagnosis not present

## 2018-03-25 ENCOUNTER — Other Ambulatory Visit: Payer: Self-pay | Admitting: Interventional Cardiology

## 2018-03-25 DIAGNOSIS — I480 Paroxysmal atrial fibrillation: Secondary | ICD-10-CM

## 2018-03-27 DIAGNOSIS — I482 Chronic atrial fibrillation, unspecified: Secondary | ICD-10-CM | POA: Diagnosis not present

## 2018-03-27 DIAGNOSIS — Z5181 Encounter for therapeutic drug level monitoring: Secondary | ICD-10-CM | POA: Diagnosis not present

## 2018-03-27 DIAGNOSIS — D688 Other specified coagulation defects: Secondary | ICD-10-CM | POA: Diagnosis not present

## 2018-03-27 DIAGNOSIS — D509 Iron deficiency anemia, unspecified: Secondary | ICD-10-CM | POA: Diagnosis not present

## 2018-03-27 DIAGNOSIS — N2581 Secondary hyperparathyroidism of renal origin: Secondary | ICD-10-CM | POA: Diagnosis not present

## 2018-03-27 DIAGNOSIS — E039 Hypothyroidism, unspecified: Secondary | ICD-10-CM | POA: Diagnosis not present

## 2018-03-27 DIAGNOSIS — A4902 Methicillin resistant Staphylococcus aureus infection, unspecified site: Secondary | ICD-10-CM | POA: Diagnosis not present

## 2018-03-27 DIAGNOSIS — N186 End stage renal disease: Secondary | ICD-10-CM | POA: Diagnosis not present

## 2018-03-29 DIAGNOSIS — D509 Iron deficiency anemia, unspecified: Secondary | ICD-10-CM | POA: Diagnosis not present

## 2018-03-29 DIAGNOSIS — N186 End stage renal disease: Secondary | ICD-10-CM | POA: Diagnosis not present

## 2018-03-29 DIAGNOSIS — A4902 Methicillin resistant Staphylococcus aureus infection, unspecified site: Secondary | ICD-10-CM | POA: Diagnosis not present

## 2018-03-29 DIAGNOSIS — E039 Hypothyroidism, unspecified: Secondary | ICD-10-CM | POA: Diagnosis not present

## 2018-03-29 DIAGNOSIS — N2581 Secondary hyperparathyroidism of renal origin: Secondary | ICD-10-CM | POA: Diagnosis not present

## 2018-03-29 DIAGNOSIS — I482 Chronic atrial fibrillation, unspecified: Secondary | ICD-10-CM | POA: Diagnosis not present

## 2018-03-29 DIAGNOSIS — Z5181 Encounter for therapeutic drug level monitoring: Secondary | ICD-10-CM | POA: Diagnosis not present

## 2018-03-29 DIAGNOSIS — D688 Other specified coagulation defects: Secondary | ICD-10-CM | POA: Diagnosis not present

## 2018-03-31 DIAGNOSIS — I482 Chronic atrial fibrillation, unspecified: Secondary | ICD-10-CM | POA: Diagnosis not present

## 2018-03-31 DIAGNOSIS — N2581 Secondary hyperparathyroidism of renal origin: Secondary | ICD-10-CM | POA: Diagnosis not present

## 2018-03-31 DIAGNOSIS — N186 End stage renal disease: Secondary | ICD-10-CM | POA: Diagnosis not present

## 2018-03-31 DIAGNOSIS — E039 Hypothyroidism, unspecified: Secondary | ICD-10-CM | POA: Diagnosis not present

## 2018-03-31 DIAGNOSIS — Z5181 Encounter for therapeutic drug level monitoring: Secondary | ICD-10-CM | POA: Diagnosis not present

## 2018-03-31 DIAGNOSIS — D688 Other specified coagulation defects: Secondary | ICD-10-CM | POA: Diagnosis not present

## 2018-03-31 DIAGNOSIS — D509 Iron deficiency anemia, unspecified: Secondary | ICD-10-CM | POA: Diagnosis not present

## 2018-03-31 DIAGNOSIS — A4902 Methicillin resistant Staphylococcus aureus infection, unspecified site: Secondary | ICD-10-CM | POA: Diagnosis not present

## 2018-04-03 DIAGNOSIS — E039 Hypothyroidism, unspecified: Secondary | ICD-10-CM | POA: Diagnosis not present

## 2018-04-03 DIAGNOSIS — Z5181 Encounter for therapeutic drug level monitoring: Secondary | ICD-10-CM | POA: Diagnosis not present

## 2018-04-03 DIAGNOSIS — I482 Chronic atrial fibrillation, unspecified: Secondary | ICD-10-CM | POA: Diagnosis not present

## 2018-04-03 DIAGNOSIS — N2581 Secondary hyperparathyroidism of renal origin: Secondary | ICD-10-CM | POA: Diagnosis not present

## 2018-04-03 DIAGNOSIS — A4902 Methicillin resistant Staphylococcus aureus infection, unspecified site: Secondary | ICD-10-CM | POA: Diagnosis not present

## 2018-04-03 DIAGNOSIS — N186 End stage renal disease: Secondary | ICD-10-CM | POA: Diagnosis not present

## 2018-04-03 DIAGNOSIS — D509 Iron deficiency anemia, unspecified: Secondary | ICD-10-CM | POA: Diagnosis not present

## 2018-04-03 DIAGNOSIS — D688 Other specified coagulation defects: Secondary | ICD-10-CM | POA: Diagnosis not present

## 2018-04-05 DIAGNOSIS — A4902 Methicillin resistant Staphylococcus aureus infection, unspecified site: Secondary | ICD-10-CM | POA: Diagnosis not present

## 2018-04-05 DIAGNOSIS — D509 Iron deficiency anemia, unspecified: Secondary | ICD-10-CM | POA: Diagnosis not present

## 2018-04-05 DIAGNOSIS — N186 End stage renal disease: Secondary | ICD-10-CM | POA: Diagnosis not present

## 2018-04-05 DIAGNOSIS — E039 Hypothyroidism, unspecified: Secondary | ICD-10-CM | POA: Diagnosis not present

## 2018-04-05 DIAGNOSIS — N2581 Secondary hyperparathyroidism of renal origin: Secondary | ICD-10-CM | POA: Diagnosis not present

## 2018-04-05 DIAGNOSIS — Z5181 Encounter for therapeutic drug level monitoring: Secondary | ICD-10-CM | POA: Diagnosis not present

## 2018-04-05 DIAGNOSIS — I482 Chronic atrial fibrillation, unspecified: Secondary | ICD-10-CM | POA: Diagnosis not present

## 2018-04-05 DIAGNOSIS — D688 Other specified coagulation defects: Secondary | ICD-10-CM | POA: Diagnosis not present

## 2018-04-09 DIAGNOSIS — N186 End stage renal disease: Secondary | ICD-10-CM | POA: Diagnosis not present

## 2018-04-09 DIAGNOSIS — D509 Iron deficiency anemia, unspecified: Secondary | ICD-10-CM | POA: Diagnosis not present

## 2018-04-09 DIAGNOSIS — Z5181 Encounter for therapeutic drug level monitoring: Secondary | ICD-10-CM | POA: Diagnosis not present

## 2018-04-09 DIAGNOSIS — D688 Other specified coagulation defects: Secondary | ICD-10-CM | POA: Diagnosis not present

## 2018-04-09 DIAGNOSIS — I482 Chronic atrial fibrillation, unspecified: Secondary | ICD-10-CM | POA: Diagnosis not present

## 2018-04-09 DIAGNOSIS — N2581 Secondary hyperparathyroidism of renal origin: Secondary | ICD-10-CM | POA: Diagnosis not present

## 2018-04-09 DIAGNOSIS — E039 Hypothyroidism, unspecified: Secondary | ICD-10-CM | POA: Diagnosis not present

## 2018-04-09 DIAGNOSIS — A4902 Methicillin resistant Staphylococcus aureus infection, unspecified site: Secondary | ICD-10-CM | POA: Diagnosis not present

## 2018-04-10 DIAGNOSIS — A4902 Methicillin resistant Staphylococcus aureus infection, unspecified site: Secondary | ICD-10-CM | POA: Diagnosis not present

## 2018-04-10 DIAGNOSIS — D509 Iron deficiency anemia, unspecified: Secondary | ICD-10-CM | POA: Diagnosis not present

## 2018-04-10 DIAGNOSIS — D688 Other specified coagulation defects: Secondary | ICD-10-CM | POA: Diagnosis not present

## 2018-04-10 DIAGNOSIS — E039 Hypothyroidism, unspecified: Secondary | ICD-10-CM | POA: Diagnosis not present

## 2018-04-10 DIAGNOSIS — I482 Chronic atrial fibrillation, unspecified: Secondary | ICD-10-CM | POA: Diagnosis not present

## 2018-04-10 DIAGNOSIS — N2581 Secondary hyperparathyroidism of renal origin: Secondary | ICD-10-CM | POA: Diagnosis not present

## 2018-04-10 DIAGNOSIS — Z5181 Encounter for therapeutic drug level monitoring: Secondary | ICD-10-CM | POA: Diagnosis not present

## 2018-04-10 DIAGNOSIS — N186 End stage renal disease: Secondary | ICD-10-CM | POA: Diagnosis not present

## 2018-04-12 DIAGNOSIS — E039 Hypothyroidism, unspecified: Secondary | ICD-10-CM | POA: Diagnosis not present

## 2018-04-12 DIAGNOSIS — I482 Chronic atrial fibrillation, unspecified: Secondary | ICD-10-CM | POA: Diagnosis not present

## 2018-04-12 DIAGNOSIS — N2581 Secondary hyperparathyroidism of renal origin: Secondary | ICD-10-CM | POA: Diagnosis not present

## 2018-04-12 DIAGNOSIS — N186 End stage renal disease: Secondary | ICD-10-CM | POA: Diagnosis not present

## 2018-04-12 DIAGNOSIS — Z5181 Encounter for therapeutic drug level monitoring: Secondary | ICD-10-CM | POA: Diagnosis not present

## 2018-04-12 DIAGNOSIS — A4902 Methicillin resistant Staphylococcus aureus infection, unspecified site: Secondary | ICD-10-CM | POA: Diagnosis not present

## 2018-04-12 DIAGNOSIS — D688 Other specified coagulation defects: Secondary | ICD-10-CM | POA: Diagnosis not present

## 2018-04-12 DIAGNOSIS — D509 Iron deficiency anemia, unspecified: Secondary | ICD-10-CM | POA: Diagnosis not present

## 2018-04-13 DIAGNOSIS — N1330 Unspecified hydronephrosis: Secondary | ICD-10-CM | POA: Diagnosis not present

## 2018-04-13 DIAGNOSIS — R310 Gross hematuria: Secondary | ICD-10-CM | POA: Diagnosis not present

## 2018-04-14 DIAGNOSIS — I482 Chronic atrial fibrillation, unspecified: Secondary | ICD-10-CM | POA: Diagnosis not present

## 2018-04-14 DIAGNOSIS — D509 Iron deficiency anemia, unspecified: Secondary | ICD-10-CM | POA: Diagnosis not present

## 2018-04-14 DIAGNOSIS — N2581 Secondary hyperparathyroidism of renal origin: Secondary | ICD-10-CM | POA: Diagnosis not present

## 2018-04-14 DIAGNOSIS — E039 Hypothyroidism, unspecified: Secondary | ICD-10-CM | POA: Diagnosis not present

## 2018-04-14 DIAGNOSIS — D688 Other specified coagulation defects: Secondary | ICD-10-CM | POA: Diagnosis not present

## 2018-04-14 DIAGNOSIS — A4902 Methicillin resistant Staphylococcus aureus infection, unspecified site: Secondary | ICD-10-CM | POA: Diagnosis not present

## 2018-04-14 DIAGNOSIS — N186 End stage renal disease: Secondary | ICD-10-CM | POA: Diagnosis not present

## 2018-04-14 DIAGNOSIS — Z5181 Encounter for therapeutic drug level monitoring: Secondary | ICD-10-CM | POA: Diagnosis not present

## 2018-04-17 DIAGNOSIS — N186 End stage renal disease: Secondary | ICD-10-CM | POA: Diagnosis not present

## 2018-04-17 DIAGNOSIS — D688 Other specified coagulation defects: Secondary | ICD-10-CM | POA: Diagnosis not present

## 2018-04-17 DIAGNOSIS — N2581 Secondary hyperparathyroidism of renal origin: Secondary | ICD-10-CM | POA: Diagnosis not present

## 2018-04-17 DIAGNOSIS — E039 Hypothyroidism, unspecified: Secondary | ICD-10-CM | POA: Diagnosis not present

## 2018-04-17 DIAGNOSIS — D509 Iron deficiency anemia, unspecified: Secondary | ICD-10-CM | POA: Diagnosis not present

## 2018-04-17 DIAGNOSIS — A4902 Methicillin resistant Staphylococcus aureus infection, unspecified site: Secondary | ICD-10-CM | POA: Diagnosis not present

## 2018-04-17 DIAGNOSIS — I482 Chronic atrial fibrillation, unspecified: Secondary | ICD-10-CM | POA: Diagnosis not present

## 2018-04-17 DIAGNOSIS — Z5181 Encounter for therapeutic drug level monitoring: Secondary | ICD-10-CM | POA: Diagnosis not present

## 2018-04-19 DIAGNOSIS — N2581 Secondary hyperparathyroidism of renal origin: Secondary | ICD-10-CM | POA: Diagnosis not present

## 2018-04-19 DIAGNOSIS — D509 Iron deficiency anemia, unspecified: Secondary | ICD-10-CM | POA: Diagnosis not present

## 2018-04-19 DIAGNOSIS — D688 Other specified coagulation defects: Secondary | ICD-10-CM | POA: Diagnosis not present

## 2018-04-19 DIAGNOSIS — A4902 Methicillin resistant Staphylococcus aureus infection, unspecified site: Secondary | ICD-10-CM | POA: Diagnosis not present

## 2018-04-19 DIAGNOSIS — N186 End stage renal disease: Secondary | ICD-10-CM | POA: Diagnosis not present

## 2018-04-19 DIAGNOSIS — Z992 Dependence on renal dialysis: Secondary | ICD-10-CM | POA: Diagnosis not present

## 2018-04-19 DIAGNOSIS — E039 Hypothyroidism, unspecified: Secondary | ICD-10-CM | POA: Diagnosis not present

## 2018-04-19 DIAGNOSIS — I482 Chronic atrial fibrillation, unspecified: Secondary | ICD-10-CM | POA: Diagnosis not present

## 2018-04-19 DIAGNOSIS — Z5181 Encounter for therapeutic drug level monitoring: Secondary | ICD-10-CM | POA: Diagnosis not present

## 2018-04-19 DIAGNOSIS — I129 Hypertensive chronic kidney disease with stage 1 through stage 4 chronic kidney disease, or unspecified chronic kidney disease: Secondary | ICD-10-CM | POA: Diagnosis not present

## 2018-04-20 ENCOUNTER — Encounter: Payer: Self-pay | Admitting: Interventional Cardiology

## 2018-04-20 ENCOUNTER — Ambulatory Visit (INDEPENDENT_AMBULATORY_CARE_PROVIDER_SITE_OTHER): Payer: Medicare Other

## 2018-04-20 ENCOUNTER — Ambulatory Visit: Payer: Medicare Other | Admitting: Interventional Cardiology

## 2018-04-20 VITALS — BP 150/72 | HR 76 | Ht 63.0 in | Wt 192.0 lb

## 2018-04-20 DIAGNOSIS — Z79899 Other long term (current) drug therapy: Secondary | ICD-10-CM | POA: Diagnosis not present

## 2018-04-20 DIAGNOSIS — I10 Essential (primary) hypertension: Secondary | ICD-10-CM

## 2018-04-20 DIAGNOSIS — Z7901 Long term (current) use of anticoagulants: Secondary | ICD-10-CM

## 2018-04-20 DIAGNOSIS — I4891 Unspecified atrial fibrillation: Secondary | ICD-10-CM

## 2018-04-20 DIAGNOSIS — I480 Paroxysmal atrial fibrillation: Secondary | ICD-10-CM

## 2018-04-20 DIAGNOSIS — Z5181 Encounter for therapeutic drug level monitoring: Secondary | ICD-10-CM | POA: Diagnosis not present

## 2018-04-20 DIAGNOSIS — I350 Nonrheumatic aortic (valve) stenosis: Secondary | ICD-10-CM | POA: Diagnosis not present

## 2018-04-20 LAB — POCT INR: INR: 2.1 (ref 2.0–3.0)

## 2018-04-20 NOTE — Patient Instructions (Signed)
Description   Continue taking 1/2 tablet daily except 1 tablet on Mondays, Wednesdays, and Fridays. Recheck INR in 6 weeks. Call with any new medications or if scheduled for any procedures or with any questions [phone]

## 2018-04-20 NOTE — Patient Instructions (Signed)
Medication Instructions:  Your physician recommends that you continue on your current medications as directed. Please refer to the Current Medication list given to you today.  If you need a refill on your cardiac medications before your next appointment, please call your pharmacy.   Lab work: Your physician recommends that you return for lab work (LFTS and TSH) on 06/01/18 the same day as your coumadin appointment   If you have labs (blood work) drawn today and your tests are completely normal, you will receive your results only by: Marland Kitchen MyChart Message (if you have MyChart) OR . A paper copy in the mail If you have any lab test that is abnormal or we need to change your treatment, we will call you to review the results.  Testing/Procedures: A chest x-ray takes a picture of the organs and structures inside the chest, including the heart, lungs, and blood vessels. This test can show several things, including, whether the heart is enlarges; whether fluid is building up in the lungs; and whether pacemaker / defibrillator leads are still in place.  Chest X-ray Instructions:    1. You may have this done at the [facility], located in the [place] on the 1st floor.    2. You do no have to have an appointment.    3. [place], [place] [address]        [phone]        Monday - Friday  8:00 am - 5:00 pm   Follow-Up: At [facility], you and your health needs are our priority.  As part of our continuing mission to provide you with exceptional heart care, we have created designated Provider Care Teams.  These Care Teams include your primary Cardiologist (physician) and Advanced Practice Providers (APPs -  Physician Assistants and Nurse Practitioners) who all work together to provide you with the care you need, when you need it. . You will need a follow up appointment in 1 year.  Please call our office 2 months in advance to schedule this  appointment.  You may see Casandra Doffing, MD or one of the following Advanced Practice Providers on your designated Care Team:   . Lyda Jester, PA-C . Dayna Dunn, PA-C . Ermalinda Barrios, PA-C  Any Other Special Instructions Will Be Listed Below (If Applicable).

## 2018-04-20 NOTE — Progress Notes (Signed)
Cardiology Office Note   Date:  04/20/2018   ID:  [name], DOB 11/05/1945, MRN [id number]  PCP:  Lajean Manes, MD    No chief complaint on file.  AFib  Wt Readings from Last 3 Encounters:  04/20/18 192 lb (87.1 kg)  11/27/17 193 lb 3.2 oz (87.6 kg)  03/07/17 194 lb 9.6 oz (88.3 kg)       History of Present Illness: [name] is a 72 y.o. female  with a hx of Dilated cardiomyopathy with EF 20-25% in the setting of sepsis in 3267 complicated by demand ischemia and transient atrial fibrillation. She had an abnormal stress test that prompted cardiac catheterization which demonstrated normal coronary arteries in 2014. Other history includes HL, HTN, diastolic HF, ESRD on hemodialysis, recurrent sepsis status post percutaneous nephrostomy, chronic anemia.  She progressed to ESRD in 2017.  Admitted 6/2-6/5/17with chest pain. CXR showed moderate L pleural effusion and trace R pleural effusion new from prior. CT without contrast showed left lung base consolidation with layering pleural effusion favored to represent pneumonia, with small but intermediate density pericardial effusion new since November (consider pericarditis), chronic large gastric hiatal hernia, calcified coronary atherosclerosis. Albumin was 2.3. Cardiac enzymes were negative. D-dimer was elevated at 3.25. Patient was treated with antibiotics and underwent left thoracentesis. She was followed by pulmonology. Pleural effusion was transudative. She developed AF with RVR in the hospital and was seen by cardiology. Repeat echo demonstrated normal LV function and possible trivial pericardial effusion. She was loaded with amiodarone for an attempt at rhythm control. She returned to NSR.  Effusion resolved on subsequent echo.   Echo in 9/18: normal LV function with mild AS.  No effusion.    Doing well. Tolerating dialysis.  SHe walks daily.  Sometimes, she walks up hill sometimes, going to church.  Denies :  Chest pain. Dizziness. Leg edema. Nitroglycerin use. Orthopnea. Palpitations. Paroxysmal nocturnal dyspnea. Shortness of breath. Syncope.     Past Medical History:  Diagnosis Date  . Cardiomyopathy ([facility])    a. h/o LV dysfunction EF 20-25% in 2013 due to sepsis.>> improved to normal   . Chronic diastolic CHF (congestive heart failure) ([place])    10/ 2013 in setting of septic shock  . Complication of anesthesia    use a little anesthesia , per patient MD states she quit breathing (2016); hard to wake up  . ESRD (end stage renal disease) ([place])    dialysis Tues, Thurs, Sat [address], sees dr deterding   . GERD (gastroesophageal reflux disease)   . Glaucoma    both eyes  . H/O hiatal hernia    a. CT 2017: large gastric hiatal hernia.  Marland Kitchen Headache(784.0)    migraine hx of  . History of blood transfusion 04/13/2015   . History of echocardiogram    a. Echo 6/17: EF 60-65%, normal wall motion, mild MR, atrial septal lipomatous hypertrophy, PASP 34 mmHg, possible trivial free-flowing pericardial effusion along RV free wall // b. Echo 5/17: Mild LVH, EF 55-60%, normal wall motion, grade 1 diastolic dysfunction, trivial MR, severe LAE, mild RAE, PASP 42 mmHg  . History of nephrostomy 04/11/2015   currently inplace 04/28/2015  removed now  . History of nuclear stress test    a. Myoview 1/14 - Marked ischemia in the basal anterior, mid anterior, apical septal and apical inferior regions, EF 63% >> LHC normal   . Hyperlipidemia   . Hypertension    medication removed from regimen  due to low blood pressure   . Iron deficiency    hx  . Nephrolithiasis 2002, 2006   bilateral  . Normal coronary arteries 2014   a. LHC in 1/14: normal coronary arteries  . PAF (paroxysmal atrial fibrillation) ([place])    a. 10/ 2013  in setting of Septic Shock //  b. recurrent during admit for pneumonia, L effusion >> placed on Amiodarone // Coumadin for anticoagulation  . Pneumonia jan 2018, last tme lungs clear now    dx 10-06-2014 per CXR--  on 10-27-2014 pt states finished antibiotic and denies cough or fever  . S/P hemodialysis catheter insertion ([place]) 04/11/2015    right anterior chest , only used once   . Sigmoid diverticulosis   . UTI (urinary tract infection) 05/10/2016    Past Surgical History:  Procedure Laterality Date  . AV FISTULA PLACEMENT Left 06/02/2015   Procedure: BRACHIOCEPHALIC ARTERIOVENOUS (AV) FISTULA CREATION ;  Surgeon: Conrad Edgar, MD;  Location: [place] [facility];  Service: Vascular;  Laterality: Left;  . BASCILIC VEIN TRANSPOSITION Left 07/27/2015   Procedure: FIRST STAGE BASILIC VEIN TRANSPOSITION LEFT UPPER ARM;  Surgeon: Conrad Oak Hills, MD;  Location: [facility];  Service: Vascular;  Laterality: Left;  . BASCILIC VEIN TRANSPOSITION Left 09/2015   second phase  . BASCILIC VEIN TRANSPOSITION Left 10/12/2015   Procedure: SECOND STAGE BASILIC VEIN TRANSPOSITION LEFT ARM;  Surgeon: Conrad Greenwood, MD;  Location: [place] [facility];  Service: Vascular;  Laterality: Left;  . BREAST BIOPSY Left 08/23/07   benign fibrocystic with duct ectasia  . CARDIAC CATHETERIZATION  07-11-2012  dr Irish Lack   Abnormal stress test/   normal coronary arteries/  LVEDP  3mmHg  . CARDIOVASCULAR STRESS TEST  06-26-2012  dr Irish Lack   marked ischemia in the basal anterior, mid anterior, apical inferior regions/  normal LVF, ef 63%  . CATARACT EXTRACTION W/ INTRAOCULAR LENS  IMPLANT, BILATERAL    . COLONOSCOPY WITH PROPOFOL N/A 10/17/2016   Procedure: COLONOSCOPY WITH PROPOFOL;  Surgeon: Garlan Fair, MD;  Location: [facility];  Service: Endoscopy;  Laterality: N/A;  . CYSTOSCOPY W/ URETERAL STENT PLACEMENT  04/04/2012   Procedure: CYSTOSCOPY WITH RETROGRADE PYELOGRAM/URETERAL STENT PLACEMENT;  Surgeon: Ailene Rud, MD;  Location: [facility];  Service: Urology;  Laterality: Left;  . CYSTOSCOPY W/ URETERAL STENT PLACEMENT Bilateral 05/04/2015   Procedure: CYSTOSCOPY WITH BILATERAL RETROGRADE PYELOGRAM/ WITH INTERPRETATION, EXCHANGE  OF RIGHT URETERAL STENT REPLACEMENT AND PLACEMENT LEFT URETERAL STENT PLACEMENT EXAMINATION OF VAGINA;  Surgeon: Carolan Clines, MD;  Location: [facility];  Service: Urology;  Laterality: Bilateral;  . CYSTOSCOPY WITH STENT PLACEMENT Right 10/28/2014   Procedure: RIGHT URETERAL STENT PLACEMENT;  Surgeon: Irine Seal, MD;  Location: [facility];  Service: Urology;  Laterality: Right;  . CYSTOSCOPY WITH STENT PLACEMENT Right 02/26/2015   Procedure: CYSTOSCOPY RETROGRADE PYELOGRAM WITH STENT PLACEMENT;  Surgeon: Cleon Gustin, MD;  Location: [facility];  Service: Urology;  Laterality: Right;  . CYSTOSCOPY/RETROGRADE/URETEROSCOPY/STONE EXTRACTION WITH BASKET Right 11/21/2014   Procedure: CYSTOSCOPY/RIGHT RETROGRADE PYELOGRAM/RIGHT URETEROSCOPY/BASKET EXTRACTION/RIGHT PYELOSCOPY/LASER OF STONE/RIGHT DOUBLE J STENT;  Surgeon: Carolan Clines, MD;  Location: [place];  Service: Urology;  Laterality: Right;  . EXTRACORPOREAL SHOCK WAVE LITHOTRIPSY  05-28-2012  &  10-08-2012  . HOLMIUM LASER APPLICATION Right 12/23/1948   Procedure: HOLMIUM LASER APPLICATION;  Surgeon: Carolan Clines, MD;  Location: [facility];  Service: Urology;  Laterality: Right;  . IR GENERIC HISTORICAL  02/01/2016   IR NEPHROSTOMY  EXCHANGE RIGHT 02/01/2016 Greggory Keen, MD [facility]  . IR GENERIC HISTORICAL  02/24/2016   IR PATIENT EVAL TECH 0-60 MINS 02/24/2016 Aletta Edouard, MD [facility]  . KNEE ARTHROSCOPY Left 02-14-2003  . LAPAROSCOPIC CHOLECYSTECTOMY  03-23-2005  . TOTAL ABDOMINAL HYSTERECTOMY W/ BILATERAL SALPINGOOPHORECTOMY  1993   secondary to fibroids  . TRANSTHORACIC ECHOCARDIOGRAM  04-09-2012   normal LVF,  ef 60-65%,  mild LAE,  mild TR, trivial MR and PR     Current Outpatient Medications  Medication Sig Dispense Refill  . acetaminophen (TYLENOL) 500 MG tablet Take 1,000 mg by mouth every 6 (six) hours as needed for moderate pain or headache.     Marland Kitchen amiodarone  (PACERONE) 200 MG tablet Take 1 tablet (200 mg total) by mouth daily. Please keep upcoming appt in November with Dr. Irish Lack for future refills. Thank you 90 tablet 0  . bimatoprost (LUMIGAN) 0.01 % SOLN Place 1 drop into both eyes at bedtime.    . calcium acetate (PHOSLO) 667 MG capsule Take 1,334 mg by mouth 3 (three) times daily with meals.     . cephALEXin (KEFLEX) 250 MG capsule Take 250 mg by mouth daily.    . Multiple Vitamins-Minerals (MULTIVITAMIN WITH MINERALS) tablet Take 1 tablet by mouth daily.    . ranitidine (ZANTAC) 150 MG tablet Take 150 mg by mouth 2 (two) times daily as needed for heartburn.    . saccharomyces boulardii (FLORASTOR) 250 MG capsule Take 1 capsule (250 mg total) by mouth 2 (two) times daily. 60 capsule 0  . sodium chloride (OCEAN) 0.65 % SOLN nasal spray Place 1 spray into both nostrils 3 (three) times daily as needed for congestion.    . timolol (BETIMOL) 0.5 % ophthalmic solution Place 1 drop into both eyes every morning.     . warfarin (COUMADIN) 5 MG tablet TAKE AS DIRECTED BY COUMADIN CLINIC 30 tablet 2   No current facility-administered medications for this visit.     Allergies:   Vicodin [hydrocodone-acetaminophen]; Chlorhexidine; and Percocet [oxycodone-acetaminophen]    Social History:  The patient  reports that she has never smoked. She has never used smokeless tobacco. She reports that she does not drink alcohol or use drugs.   Family History:  The patient's family history includes Bladder Cancer in her maternal grandfather; Cancer (age of onset: 79) in her father; Cancer (age of onset: 25) in her mother; Dementia in her mother; Diabetes in her brother; Heart disease in her brother; Heart failure in her paternal grandmother; Hypertension in her brother and mother.    ROS:  Please see the history of present illness.   Otherwise, review of systems are positive for doing well.   All other systems are reviewed and negative.    PHYSICAL EXAM: VS:  BP  (!) 150/72   Pulse 76   Ht 5\' 3"  (1.6 m)   Wt 192 lb (87.1 kg)   LMP 01/19/1992 (Approximate)   SpO2 96%   BMI 34.01 kg/m  , BMI Body mass index is 34.01 kg/m. GEN: Well nourished, well developed, in no acute distress  HEENT: normal  Neck: no JVD, carotid bruits, or masses Cardiac: RRR; 2/6 systolic murmur, no rubs, or gallops,no edema  Respiratory:  clear to auscultation bilaterally, normal work of breathing GI: soft, nontender, nondistended, + BS MS: no deformity or atrophy  Skin: warm and dry, no rash Neuro:  Strength and sensation are intact Psych: euthymic mood, full affect   EKG:   The ekg  ordered today demonstrates NSR, incomplete RBBB, no ST changes   Recent Labs: 11/27/2017: BUN 19; Creatinine, Ser 4.74; Hemoglobin 9.7; Magnesium 2.0; Platelets 203; Potassium 3.7; Sodium 139   Lipid Panel    Component Value Date/Time   CHOL 146 11/20/2015 1302   TRIG 84 07/04/2015 1014   HDL 43 07/04/2015 1014   CHOLHDL 2.9 07/04/2015 1014   VLDL 17 07/04/2015 1014   LDLCALC 63 07/04/2015 1014     Other studies Reviewed: Additional studies/ records that were reviewed today with results demonstrating: 2018 echo showed mild AS.   ASSESSMENT AND PLAN:  1. PAF: In NSR. On amiodarone.  Will check thyroid and liver tests since she is on Amiodarone. Will need chest xray as well for routine screening.  Coumadin for stroke prevention. 2. Hypertension: High before dialysis, but better after dialysis. The current medical regimen is effective;  continue present plan and medications. 3. Mild AS: No sx of severe AS.  4. Prior cardiomyopathy:  No CHF sx.  5. Anticoagulated: Coumadin Managed in our office.    Current medicines are reviewed at length with the patient today.  The patient concerns regarding her medicines were addressed.  The following changes have been made:  No change  Labs/ tests ordered today include:  No orders of the defined types were placed in this  encounter.   Recommend 150 minutes/week of aerobic exercise Low fat, low carb, high fiber diet recommended  Disposition:   FU in 1 year   Signed, Larae Grooms, MD  04/20/2018 3:21 PM    [facility] [address], [place], [place]  [address] Phone: [phone]; Fax: [phone]

## 2018-04-21 DIAGNOSIS — R51 Headache: Secondary | ICD-10-CM | POA: Diagnosis not present

## 2018-04-21 DIAGNOSIS — D509 Iron deficiency anemia, unspecified: Secondary | ICD-10-CM | POA: Diagnosis not present

## 2018-04-21 DIAGNOSIS — Z5181 Encounter for therapeutic drug level monitoring: Secondary | ICD-10-CM | POA: Diagnosis not present

## 2018-04-21 DIAGNOSIS — Z8614 Personal history of Methicillin resistant Staphylococcus aureus infection: Secondary | ICD-10-CM | POA: Diagnosis not present

## 2018-04-21 DIAGNOSIS — D688 Other specified coagulation defects: Secondary | ICD-10-CM | POA: Diagnosis not present

## 2018-04-21 DIAGNOSIS — N2581 Secondary hyperparathyroidism of renal origin: Secondary | ICD-10-CM | POA: Diagnosis not present

## 2018-04-21 DIAGNOSIS — I482 Chronic atrial fibrillation, unspecified: Secondary | ICD-10-CM | POA: Diagnosis not present

## 2018-04-21 DIAGNOSIS — D631 Anemia in chronic kidney disease: Secondary | ICD-10-CM | POA: Diagnosis not present

## 2018-04-21 DIAGNOSIS — E039 Hypothyroidism, unspecified: Secondary | ICD-10-CM | POA: Diagnosis not present

## 2018-04-21 DIAGNOSIS — N186 End stage renal disease: Secondary | ICD-10-CM | POA: Diagnosis not present

## 2018-04-23 ENCOUNTER — Ambulatory Visit
Admission: RE | Admit: 2018-04-23 | Discharge: 2018-04-23 | Disposition: A | Discharge status: Home / Self Care | Payer: Medicare Other | Source: Ambulatory Visit | Attending: Interventional Cardiology | Admitting: Interventional Cardiology

## 2018-04-23 DIAGNOSIS — I10 Essential (primary) hypertension: Secondary | ICD-10-CM

## 2018-04-23 DIAGNOSIS — I350 Nonrheumatic aortic (valve) stenosis: Secondary | ICD-10-CM

## 2018-04-23 DIAGNOSIS — K449 Diaphragmatic hernia without obstruction or gangrene: Secondary | ICD-10-CM | POA: Diagnosis not present

## 2018-04-23 DIAGNOSIS — Z79899 Other long term (current) drug therapy: Secondary | ICD-10-CM

## 2018-04-23 DIAGNOSIS — I480 Paroxysmal atrial fibrillation: Secondary | ICD-10-CM

## 2018-04-24 DIAGNOSIS — Z8614 Personal history of Methicillin resistant Staphylococcus aureus infection: Secondary | ICD-10-CM | POA: Diagnosis not present

## 2018-04-24 DIAGNOSIS — D631 Anemia in chronic kidney disease: Secondary | ICD-10-CM | POA: Diagnosis not present

## 2018-04-24 DIAGNOSIS — Z5181 Encounter for therapeutic drug level monitoring: Secondary | ICD-10-CM | POA: Diagnosis not present

## 2018-04-24 DIAGNOSIS — D688 Other specified coagulation defects: Secondary | ICD-10-CM | POA: Diagnosis not present

## 2018-04-24 DIAGNOSIS — E039 Hypothyroidism, unspecified: Secondary | ICD-10-CM | POA: Diagnosis not present

## 2018-04-24 DIAGNOSIS — I482 Chronic atrial fibrillation, unspecified: Secondary | ICD-10-CM | POA: Diagnosis not present

## 2018-04-24 DIAGNOSIS — N186 End stage renal disease: Secondary | ICD-10-CM | POA: Diagnosis not present

## 2018-04-24 DIAGNOSIS — R51 Headache: Secondary | ICD-10-CM | POA: Diagnosis not present

## 2018-04-24 DIAGNOSIS — D509 Iron deficiency anemia, unspecified: Secondary | ICD-10-CM | POA: Diagnosis not present

## 2018-04-24 DIAGNOSIS — N2581 Secondary hyperparathyroidism of renal origin: Secondary | ICD-10-CM | POA: Diagnosis not present

## 2018-04-26 DIAGNOSIS — D509 Iron deficiency anemia, unspecified: Secondary | ICD-10-CM | POA: Diagnosis not present

## 2018-04-26 DIAGNOSIS — Z8614 Personal history of Methicillin resistant Staphylococcus aureus infection: Secondary | ICD-10-CM | POA: Diagnosis not present

## 2018-04-26 DIAGNOSIS — D631 Anemia in chronic kidney disease: Secondary | ICD-10-CM | POA: Diagnosis not present

## 2018-04-26 DIAGNOSIS — N186 End stage renal disease: Secondary | ICD-10-CM | POA: Diagnosis not present

## 2018-04-26 DIAGNOSIS — D688 Other specified coagulation defects: Secondary | ICD-10-CM | POA: Diagnosis not present

## 2018-04-26 DIAGNOSIS — I482 Chronic atrial fibrillation, unspecified: Secondary | ICD-10-CM | POA: Diagnosis not present

## 2018-04-26 DIAGNOSIS — Z5181 Encounter for therapeutic drug level monitoring: Secondary | ICD-10-CM | POA: Diagnosis not present

## 2018-04-26 DIAGNOSIS — N2581 Secondary hyperparathyroidism of renal origin: Secondary | ICD-10-CM | POA: Diagnosis not present

## 2018-04-26 DIAGNOSIS — E039 Hypothyroidism, unspecified: Secondary | ICD-10-CM | POA: Diagnosis not present

## 2018-04-26 DIAGNOSIS — R51 Headache: Secondary | ICD-10-CM | POA: Diagnosis not present

## 2018-04-28 DIAGNOSIS — E039 Hypothyroidism, unspecified: Secondary | ICD-10-CM | POA: Diagnosis not present

## 2018-04-28 DIAGNOSIS — N186 End stage renal disease: Secondary | ICD-10-CM | POA: Diagnosis not present

## 2018-04-28 DIAGNOSIS — N2581 Secondary hyperparathyroidism of renal origin: Secondary | ICD-10-CM | POA: Diagnosis not present

## 2018-04-28 DIAGNOSIS — Z8614 Personal history of Methicillin resistant Staphylococcus aureus infection: Secondary | ICD-10-CM | POA: Diagnosis not present

## 2018-04-28 DIAGNOSIS — Z5181 Encounter for therapeutic drug level monitoring: Secondary | ICD-10-CM | POA: Diagnosis not present

## 2018-04-28 DIAGNOSIS — R51 Headache: Secondary | ICD-10-CM | POA: Diagnosis not present

## 2018-04-28 DIAGNOSIS — D631 Anemia in chronic kidney disease: Secondary | ICD-10-CM | POA: Diagnosis not present

## 2018-04-28 DIAGNOSIS — D688 Other specified coagulation defects: Secondary | ICD-10-CM | POA: Diagnosis not present

## 2018-04-28 DIAGNOSIS — I482 Chronic atrial fibrillation, unspecified: Secondary | ICD-10-CM | POA: Diagnosis not present

## 2018-04-28 DIAGNOSIS — D509 Iron deficiency anemia, unspecified: Secondary | ICD-10-CM | POA: Diagnosis not present

## 2018-05-01 DIAGNOSIS — E039 Hypothyroidism, unspecified: Secondary | ICD-10-CM | POA: Diagnosis not present

## 2018-05-01 DIAGNOSIS — D509 Iron deficiency anemia, unspecified: Secondary | ICD-10-CM | POA: Diagnosis not present

## 2018-05-01 DIAGNOSIS — Z5181 Encounter for therapeutic drug level monitoring: Secondary | ICD-10-CM | POA: Diagnosis not present

## 2018-05-01 DIAGNOSIS — Z8614 Personal history of Methicillin resistant Staphylococcus aureus infection: Secondary | ICD-10-CM | POA: Diagnosis not present

## 2018-05-01 DIAGNOSIS — N2581 Secondary hyperparathyroidism of renal origin: Secondary | ICD-10-CM | POA: Diagnosis not present

## 2018-05-01 DIAGNOSIS — I482 Chronic atrial fibrillation, unspecified: Secondary | ICD-10-CM | POA: Diagnosis not present

## 2018-05-01 DIAGNOSIS — N186 End stage renal disease: Secondary | ICD-10-CM | POA: Diagnosis not present

## 2018-05-01 DIAGNOSIS — D688 Other specified coagulation defects: Secondary | ICD-10-CM | POA: Diagnosis not present

## 2018-05-01 DIAGNOSIS — D631 Anemia in chronic kidney disease: Secondary | ICD-10-CM | POA: Diagnosis not present

## 2018-05-01 DIAGNOSIS — R51 Headache: Secondary | ICD-10-CM | POA: Diagnosis not present

## 2018-05-02 DIAGNOSIS — N3000 Acute cystitis without hematuria: Secondary | ICD-10-CM | POA: Diagnosis not present

## 2018-05-02 DIAGNOSIS — Z8614 Personal history of Methicillin resistant Staphylococcus aureus infection: Secondary | ICD-10-CM | POA: Insufficient documentation

## 2018-05-02 DIAGNOSIS — R310 Gross hematuria: Secondary | ICD-10-CM | POA: Diagnosis not present

## 2018-05-02 DIAGNOSIS — N1330 Unspecified hydronephrosis: Secondary | ICD-10-CM | POA: Diagnosis not present

## 2018-05-02 DIAGNOSIS — N186 End stage renal disease: Secondary | ICD-10-CM | POA: Diagnosis not present

## 2018-05-03 DIAGNOSIS — Z5181 Encounter for therapeutic drug level monitoring: Secondary | ICD-10-CM | POA: Diagnosis not present

## 2018-05-03 DIAGNOSIS — R51 Headache: Secondary | ICD-10-CM | POA: Diagnosis not present

## 2018-05-03 DIAGNOSIS — Z8614 Personal history of Methicillin resistant Staphylococcus aureus infection: Secondary | ICD-10-CM | POA: Diagnosis not present

## 2018-05-03 DIAGNOSIS — D688 Other specified coagulation defects: Secondary | ICD-10-CM | POA: Diagnosis not present

## 2018-05-03 DIAGNOSIS — E039 Hypothyroidism, unspecified: Secondary | ICD-10-CM | POA: Diagnosis not present

## 2018-05-03 DIAGNOSIS — D509 Iron deficiency anemia, unspecified: Secondary | ICD-10-CM | POA: Diagnosis not present

## 2018-05-03 DIAGNOSIS — D631 Anemia in chronic kidney disease: Secondary | ICD-10-CM | POA: Diagnosis not present

## 2018-05-03 DIAGNOSIS — I482 Chronic atrial fibrillation, unspecified: Secondary | ICD-10-CM | POA: Diagnosis not present

## 2018-05-03 DIAGNOSIS — N2581 Secondary hyperparathyroidism of renal origin: Secondary | ICD-10-CM | POA: Diagnosis not present

## 2018-05-03 DIAGNOSIS — N186 End stage renal disease: Secondary | ICD-10-CM | POA: Diagnosis not present

## 2018-05-05 DIAGNOSIS — D688 Other specified coagulation defects: Secondary | ICD-10-CM | POA: Diagnosis not present

## 2018-05-05 DIAGNOSIS — E039 Hypothyroidism, unspecified: Secondary | ICD-10-CM | POA: Diagnosis not present

## 2018-05-05 DIAGNOSIS — D509 Iron deficiency anemia, unspecified: Secondary | ICD-10-CM | POA: Diagnosis not present

## 2018-05-05 DIAGNOSIS — R51 Headache: Secondary | ICD-10-CM | POA: Diagnosis not present

## 2018-05-05 DIAGNOSIS — Z8614 Personal history of Methicillin resistant Staphylococcus aureus infection: Secondary | ICD-10-CM | POA: Diagnosis not present

## 2018-05-05 DIAGNOSIS — N186 End stage renal disease: Secondary | ICD-10-CM | POA: Diagnosis not present

## 2018-05-05 DIAGNOSIS — N2581 Secondary hyperparathyroidism of renal origin: Secondary | ICD-10-CM | POA: Diagnosis not present

## 2018-05-05 DIAGNOSIS — I482 Chronic atrial fibrillation, unspecified: Secondary | ICD-10-CM | POA: Diagnosis not present

## 2018-05-05 DIAGNOSIS — Z5181 Encounter for therapeutic drug level monitoring: Secondary | ICD-10-CM | POA: Diagnosis not present

## 2018-05-05 DIAGNOSIS — D631 Anemia in chronic kidney disease: Secondary | ICD-10-CM | POA: Diagnosis not present

## 2018-05-07 DIAGNOSIS — R310 Gross hematuria: Secondary | ICD-10-CM | POA: Diagnosis not present

## 2018-05-07 DIAGNOSIS — N1330 Unspecified hydronephrosis: Secondary | ICD-10-CM | POA: Diagnosis not present

## 2018-05-08 DIAGNOSIS — R51 Headache: Secondary | ICD-10-CM | POA: Diagnosis not present

## 2018-05-08 DIAGNOSIS — D631 Anemia in chronic kidney disease: Secondary | ICD-10-CM | POA: Diagnosis not present

## 2018-05-08 DIAGNOSIS — N186 End stage renal disease: Secondary | ICD-10-CM | POA: Diagnosis not present

## 2018-05-08 DIAGNOSIS — N2581 Secondary hyperparathyroidism of renal origin: Secondary | ICD-10-CM | POA: Diagnosis not present

## 2018-05-08 DIAGNOSIS — D509 Iron deficiency anemia, unspecified: Secondary | ICD-10-CM | POA: Diagnosis not present

## 2018-05-08 DIAGNOSIS — Z8614 Personal history of Methicillin resistant Staphylococcus aureus infection: Secondary | ICD-10-CM | POA: Diagnosis not present

## 2018-05-08 DIAGNOSIS — Z5181 Encounter for therapeutic drug level monitoring: Secondary | ICD-10-CM | POA: Diagnosis not present

## 2018-05-08 DIAGNOSIS — E039 Hypothyroidism, unspecified: Secondary | ICD-10-CM | POA: Diagnosis not present

## 2018-05-08 DIAGNOSIS — D688 Other specified coagulation defects: Secondary | ICD-10-CM | POA: Diagnosis not present

## 2018-05-08 DIAGNOSIS — I482 Chronic atrial fibrillation, unspecified: Secondary | ICD-10-CM | POA: Diagnosis not present

## 2018-05-10 DIAGNOSIS — D631 Anemia in chronic kidney disease: Secondary | ICD-10-CM | POA: Diagnosis not present

## 2018-05-10 DIAGNOSIS — N2581 Secondary hyperparathyroidism of renal origin: Secondary | ICD-10-CM | POA: Diagnosis not present

## 2018-05-10 DIAGNOSIS — D509 Iron deficiency anemia, unspecified: Secondary | ICD-10-CM | POA: Diagnosis not present

## 2018-05-10 DIAGNOSIS — I482 Chronic atrial fibrillation, unspecified: Secondary | ICD-10-CM | POA: Diagnosis not present

## 2018-05-10 DIAGNOSIS — N186 End stage renal disease: Secondary | ICD-10-CM | POA: Diagnosis not present

## 2018-05-10 DIAGNOSIS — D688 Other specified coagulation defects: Secondary | ICD-10-CM | POA: Diagnosis not present

## 2018-05-10 DIAGNOSIS — Z8614 Personal history of Methicillin resistant Staphylococcus aureus infection: Secondary | ICD-10-CM | POA: Diagnosis not present

## 2018-05-10 DIAGNOSIS — R51 Headache: Secondary | ICD-10-CM | POA: Diagnosis not present

## 2018-05-10 DIAGNOSIS — E039 Hypothyroidism, unspecified: Secondary | ICD-10-CM | POA: Diagnosis not present

## 2018-05-10 DIAGNOSIS — Z5181 Encounter for therapeutic drug level monitoring: Secondary | ICD-10-CM | POA: Diagnosis not present

## 2018-05-12 DIAGNOSIS — I482 Chronic atrial fibrillation, unspecified: Secondary | ICD-10-CM | POA: Diagnosis not present

## 2018-05-12 DIAGNOSIS — D688 Other specified coagulation defects: Secondary | ICD-10-CM | POA: Diagnosis not present

## 2018-05-12 DIAGNOSIS — E039 Hypothyroidism, unspecified: Secondary | ICD-10-CM | POA: Diagnosis not present

## 2018-05-12 DIAGNOSIS — N186 End stage renal disease: Secondary | ICD-10-CM | POA: Diagnosis not present

## 2018-05-12 DIAGNOSIS — R51 Headache: Secondary | ICD-10-CM | POA: Diagnosis not present

## 2018-05-12 DIAGNOSIS — Z8614 Personal history of Methicillin resistant Staphylococcus aureus infection: Secondary | ICD-10-CM | POA: Diagnosis not present

## 2018-05-12 DIAGNOSIS — D631 Anemia in chronic kidney disease: Secondary | ICD-10-CM | POA: Diagnosis not present

## 2018-05-12 DIAGNOSIS — N2581 Secondary hyperparathyroidism of renal origin: Secondary | ICD-10-CM | POA: Diagnosis not present

## 2018-05-12 DIAGNOSIS — Z5181 Encounter for therapeutic drug level monitoring: Secondary | ICD-10-CM | POA: Diagnosis not present

## 2018-05-12 DIAGNOSIS — D509 Iron deficiency anemia, unspecified: Secondary | ICD-10-CM | POA: Diagnosis not present

## 2018-05-14 DIAGNOSIS — Z5181 Encounter for therapeutic drug level monitoring: Secondary | ICD-10-CM | POA: Diagnosis not present

## 2018-05-14 DIAGNOSIS — I482 Chronic atrial fibrillation, unspecified: Secondary | ICD-10-CM | POA: Diagnosis not present

## 2018-05-14 DIAGNOSIS — D688 Other specified coagulation defects: Secondary | ICD-10-CM | POA: Diagnosis not present

## 2018-05-14 DIAGNOSIS — D631 Anemia in chronic kidney disease: Secondary | ICD-10-CM | POA: Diagnosis not present

## 2018-05-14 DIAGNOSIS — R51 Headache: Secondary | ICD-10-CM | POA: Diagnosis not present

## 2018-05-14 DIAGNOSIS — N2581 Secondary hyperparathyroidism of renal origin: Secondary | ICD-10-CM | POA: Diagnosis not present

## 2018-05-14 DIAGNOSIS — E039 Hypothyroidism, unspecified: Secondary | ICD-10-CM | POA: Diagnosis not present

## 2018-05-14 DIAGNOSIS — N186 End stage renal disease: Secondary | ICD-10-CM | POA: Diagnosis not present

## 2018-05-14 DIAGNOSIS — Z8614 Personal history of Methicillin resistant Staphylococcus aureus infection: Secondary | ICD-10-CM | POA: Diagnosis not present

## 2018-05-14 DIAGNOSIS — D509 Iron deficiency anemia, unspecified: Secondary | ICD-10-CM | POA: Diagnosis not present

## 2018-05-18 DIAGNOSIS — E039 Hypothyroidism, unspecified: Secondary | ICD-10-CM | POA: Diagnosis not present

## 2018-05-18 DIAGNOSIS — R51 Headache: Secondary | ICD-10-CM | POA: Diagnosis not present

## 2018-05-18 DIAGNOSIS — N2581 Secondary hyperparathyroidism of renal origin: Secondary | ICD-10-CM | POA: Diagnosis not present

## 2018-05-18 DIAGNOSIS — D509 Iron deficiency anemia, unspecified: Secondary | ICD-10-CM | POA: Diagnosis not present

## 2018-05-18 DIAGNOSIS — D688 Other specified coagulation defects: Secondary | ICD-10-CM | POA: Diagnosis not present

## 2018-05-18 DIAGNOSIS — N186 End stage renal disease: Secondary | ICD-10-CM | POA: Diagnosis not present

## 2018-05-18 DIAGNOSIS — Z8614 Personal history of Methicillin resistant Staphylococcus aureus infection: Secondary | ICD-10-CM | POA: Diagnosis not present

## 2018-05-18 DIAGNOSIS — D631 Anemia in chronic kidney disease: Secondary | ICD-10-CM | POA: Diagnosis not present

## 2018-05-18 DIAGNOSIS — Z5181 Encounter for therapeutic drug level monitoring: Secondary | ICD-10-CM | POA: Diagnosis not present

## 2018-05-18 DIAGNOSIS — I482 Chronic atrial fibrillation, unspecified: Secondary | ICD-10-CM | POA: Diagnosis not present

## 2018-05-19 DIAGNOSIS — N186 End stage renal disease: Secondary | ICD-10-CM | POA: Diagnosis not present

## 2018-05-19 DIAGNOSIS — Z5181 Encounter for therapeutic drug level monitoring: Secondary | ICD-10-CM | POA: Diagnosis not present

## 2018-05-19 DIAGNOSIS — I482 Chronic atrial fibrillation, unspecified: Secondary | ICD-10-CM | POA: Diagnosis not present

## 2018-05-19 DIAGNOSIS — Z8614 Personal history of Methicillin resistant Staphylococcus aureus infection: Secondary | ICD-10-CM | POA: Diagnosis not present

## 2018-05-19 DIAGNOSIS — I129 Hypertensive chronic kidney disease with stage 1 through stage 4 chronic kidney disease, or unspecified chronic kidney disease: Secondary | ICD-10-CM | POA: Diagnosis not present

## 2018-05-19 DIAGNOSIS — E039 Hypothyroidism, unspecified: Secondary | ICD-10-CM | POA: Diagnosis not present

## 2018-05-19 DIAGNOSIS — Z992 Dependence on renal dialysis: Secondary | ICD-10-CM | POA: Diagnosis not present

## 2018-05-19 DIAGNOSIS — R51 Headache: Secondary | ICD-10-CM | POA: Diagnosis not present

## 2018-05-19 DIAGNOSIS — N2581 Secondary hyperparathyroidism of renal origin: Secondary | ICD-10-CM | POA: Diagnosis not present

## 2018-05-19 DIAGNOSIS — D631 Anemia in chronic kidney disease: Secondary | ICD-10-CM | POA: Diagnosis not present

## 2018-05-19 DIAGNOSIS — D688 Other specified coagulation defects: Secondary | ICD-10-CM | POA: Diagnosis not present

## 2018-05-19 DIAGNOSIS — D509 Iron deficiency anemia, unspecified: Secondary | ICD-10-CM | POA: Diagnosis not present

## 2018-05-22 DIAGNOSIS — N2581 Secondary hyperparathyroidism of renal origin: Secondary | ICD-10-CM | POA: Diagnosis not present

## 2018-05-22 DIAGNOSIS — Z5181 Encounter for therapeutic drug level monitoring: Secondary | ICD-10-CM | POA: Diagnosis not present

## 2018-05-22 DIAGNOSIS — D688 Other specified coagulation defects: Secondary | ICD-10-CM | POA: Diagnosis not present

## 2018-05-22 DIAGNOSIS — N186 End stage renal disease: Secondary | ICD-10-CM | POA: Diagnosis not present

## 2018-05-22 DIAGNOSIS — D509 Iron deficiency anemia, unspecified: Secondary | ICD-10-CM | POA: Diagnosis not present

## 2018-05-22 DIAGNOSIS — D631 Anemia in chronic kidney disease: Secondary | ICD-10-CM | POA: Diagnosis not present

## 2018-05-22 DIAGNOSIS — E039 Hypothyroidism, unspecified: Secondary | ICD-10-CM | POA: Diagnosis not present

## 2018-05-22 DIAGNOSIS — I482 Chronic atrial fibrillation, unspecified: Secondary | ICD-10-CM | POA: Diagnosis not present

## 2018-05-24 ENCOUNTER — Other Ambulatory Visit: Payer: Self-pay | Admitting: Interventional Cardiology

## 2018-05-24 DIAGNOSIS — N186 End stage renal disease: Secondary | ICD-10-CM | POA: Diagnosis not present

## 2018-05-24 DIAGNOSIS — Z5181 Encounter for therapeutic drug level monitoring: Secondary | ICD-10-CM | POA: Diagnosis not present

## 2018-05-24 DIAGNOSIS — N2581 Secondary hyperparathyroidism of renal origin: Secondary | ICD-10-CM | POA: Diagnosis not present

## 2018-05-24 DIAGNOSIS — D688 Other specified coagulation defects: Secondary | ICD-10-CM | POA: Diagnosis not present

## 2018-05-24 DIAGNOSIS — D631 Anemia in chronic kidney disease: Secondary | ICD-10-CM | POA: Diagnosis not present

## 2018-05-24 DIAGNOSIS — I482 Chronic atrial fibrillation, unspecified: Secondary | ICD-10-CM | POA: Diagnosis not present

## 2018-05-24 DIAGNOSIS — D509 Iron deficiency anemia, unspecified: Secondary | ICD-10-CM | POA: Diagnosis not present

## 2018-05-24 DIAGNOSIS — E039 Hypothyroidism, unspecified: Secondary | ICD-10-CM | POA: Diagnosis not present

## 2018-05-26 DIAGNOSIS — Z5181 Encounter for therapeutic drug level monitoring: Secondary | ICD-10-CM | POA: Diagnosis not present

## 2018-05-26 DIAGNOSIS — D509 Iron deficiency anemia, unspecified: Secondary | ICD-10-CM | POA: Diagnosis not present

## 2018-05-26 DIAGNOSIS — D688 Other specified coagulation defects: Secondary | ICD-10-CM | POA: Diagnosis not present

## 2018-05-26 DIAGNOSIS — N2581 Secondary hyperparathyroidism of renal origin: Secondary | ICD-10-CM | POA: Diagnosis not present

## 2018-05-26 DIAGNOSIS — I482 Chronic atrial fibrillation, unspecified: Secondary | ICD-10-CM | POA: Diagnosis not present

## 2018-05-26 DIAGNOSIS — D631 Anemia in chronic kidney disease: Secondary | ICD-10-CM | POA: Diagnosis not present

## 2018-05-26 DIAGNOSIS — E039 Hypothyroidism, unspecified: Secondary | ICD-10-CM | POA: Diagnosis not present

## 2018-05-26 DIAGNOSIS — N186 End stage renal disease: Secondary | ICD-10-CM | POA: Diagnosis not present

## 2018-05-29 DIAGNOSIS — D631 Anemia in chronic kidney disease: Secondary | ICD-10-CM | POA: Diagnosis not present

## 2018-05-29 DIAGNOSIS — N2581 Secondary hyperparathyroidism of renal origin: Secondary | ICD-10-CM | POA: Diagnosis not present

## 2018-05-29 DIAGNOSIS — D509 Iron deficiency anemia, unspecified: Secondary | ICD-10-CM | POA: Diagnosis not present

## 2018-05-29 DIAGNOSIS — N186 End stage renal disease: Secondary | ICD-10-CM | POA: Diagnosis not present

## 2018-05-29 DIAGNOSIS — D688 Other specified coagulation defects: Secondary | ICD-10-CM | POA: Diagnosis not present

## 2018-05-29 DIAGNOSIS — Z5181 Encounter for therapeutic drug level monitoring: Secondary | ICD-10-CM | POA: Diagnosis not present

## 2018-05-29 DIAGNOSIS — E039 Hypothyroidism, unspecified: Secondary | ICD-10-CM | POA: Diagnosis not present

## 2018-05-29 DIAGNOSIS — I482 Chronic atrial fibrillation, unspecified: Secondary | ICD-10-CM | POA: Diagnosis not present

## 2018-05-30 DIAGNOSIS — N3020 Other chronic cystitis without hematuria: Secondary | ICD-10-CM | POA: Diagnosis not present

## 2018-05-31 DIAGNOSIS — E039 Hypothyroidism, unspecified: Secondary | ICD-10-CM | POA: Diagnosis not present

## 2018-05-31 DIAGNOSIS — I482 Chronic atrial fibrillation, unspecified: Secondary | ICD-10-CM | POA: Diagnosis not present

## 2018-05-31 DIAGNOSIS — D688 Other specified coagulation defects: Secondary | ICD-10-CM | POA: Diagnosis not present

## 2018-05-31 DIAGNOSIS — D509 Iron deficiency anemia, unspecified: Secondary | ICD-10-CM | POA: Diagnosis not present

## 2018-05-31 DIAGNOSIS — Z5181 Encounter for therapeutic drug level monitoring: Secondary | ICD-10-CM | POA: Diagnosis not present

## 2018-05-31 DIAGNOSIS — D631 Anemia in chronic kidney disease: Secondary | ICD-10-CM | POA: Diagnosis not present

## 2018-05-31 DIAGNOSIS — N186 End stage renal disease: Secondary | ICD-10-CM | POA: Diagnosis not present

## 2018-05-31 DIAGNOSIS — N2581 Secondary hyperparathyroidism of renal origin: Secondary | ICD-10-CM | POA: Diagnosis not present

## 2018-06-01 ENCOUNTER — Ambulatory Visit: Payer: Medicare Other | Admitting: Pharmacist

## 2018-06-01 ENCOUNTER — Other Ambulatory Visit: Payer: Medicare Other | Admitting: *Deleted

## 2018-06-01 DIAGNOSIS — I10 Essential (primary) hypertension: Secondary | ICD-10-CM | POA: Diagnosis not present

## 2018-06-01 DIAGNOSIS — I350 Nonrheumatic aortic (valve) stenosis: Secondary | ICD-10-CM

## 2018-06-01 DIAGNOSIS — Z5181 Encounter for therapeutic drug level monitoring: Secondary | ICD-10-CM | POA: Diagnosis not present

## 2018-06-01 DIAGNOSIS — Z79899 Other long term (current) drug therapy: Secondary | ICD-10-CM

## 2018-06-01 DIAGNOSIS — I4891 Unspecified atrial fibrillation: Secondary | ICD-10-CM

## 2018-06-01 DIAGNOSIS — I480 Paroxysmal atrial fibrillation: Secondary | ICD-10-CM | POA: Diagnosis not present

## 2018-06-01 LAB — POCT INR: INR: 2.1 (ref 2.0–3.0)

## 2018-06-01 NOTE — Patient Instructions (Signed)
Continue taking 1/2 tablet daily except 1 tablet on Mondays, Wednesdays, and Fridays. Recheck INR in 6 weeks. Call with any new medications or if scheduled for any procedures or with any questions [phone]

## 2018-06-02 DIAGNOSIS — N186 End stage renal disease: Secondary | ICD-10-CM | POA: Diagnosis not present

## 2018-06-02 DIAGNOSIS — D631 Anemia in chronic kidney disease: Secondary | ICD-10-CM | POA: Diagnosis not present

## 2018-06-02 DIAGNOSIS — E039 Hypothyroidism, unspecified: Secondary | ICD-10-CM | POA: Diagnosis not present

## 2018-06-02 DIAGNOSIS — D509 Iron deficiency anemia, unspecified: Secondary | ICD-10-CM | POA: Diagnosis not present

## 2018-06-02 DIAGNOSIS — Z5181 Encounter for therapeutic drug level monitoring: Secondary | ICD-10-CM | POA: Diagnosis not present

## 2018-06-02 DIAGNOSIS — D688 Other specified coagulation defects: Secondary | ICD-10-CM | POA: Diagnosis not present

## 2018-06-02 DIAGNOSIS — N2581 Secondary hyperparathyroidism of renal origin: Secondary | ICD-10-CM | POA: Diagnosis not present

## 2018-06-02 DIAGNOSIS — I482 Chronic atrial fibrillation, unspecified: Secondary | ICD-10-CM | POA: Diagnosis not present

## 2018-06-02 LAB — HEPATIC FUNCTION PANEL
ALT: 15 IU/L (ref 0–32)
AST: 19 IU/L (ref 0–40)
Albumin: 4.1 g/dL (ref 3.5–4.8)
Alkaline Phosphatase: 133 IU/L — ABNORMAL HIGH (ref 39–117)
Bilirubin Total: 0.2 mg/dL (ref 0.0–1.2)
Bilirubin, Direct: 0.07 mg/dL (ref 0.00–0.40)
Total Protein: 6.2 g/dL (ref 6.0–8.5)

## 2018-06-02 LAB — TSH: TSH: 2.76 u[IU]/mL (ref 0.450–4.500)

## 2018-06-05 DIAGNOSIS — D688 Other specified coagulation defects: Secondary | ICD-10-CM | POA: Diagnosis not present

## 2018-06-05 DIAGNOSIS — N186 End stage renal disease: Secondary | ICD-10-CM | POA: Diagnosis not present

## 2018-06-05 DIAGNOSIS — E039 Hypothyroidism, unspecified: Secondary | ICD-10-CM | POA: Diagnosis not present

## 2018-06-05 DIAGNOSIS — D631 Anemia in chronic kidney disease: Secondary | ICD-10-CM | POA: Diagnosis not present

## 2018-06-05 DIAGNOSIS — I482 Chronic atrial fibrillation, unspecified: Secondary | ICD-10-CM | POA: Diagnosis not present

## 2018-06-05 DIAGNOSIS — N2581 Secondary hyperparathyroidism of renal origin: Secondary | ICD-10-CM | POA: Diagnosis not present

## 2018-06-05 DIAGNOSIS — D509 Iron deficiency anemia, unspecified: Secondary | ICD-10-CM | POA: Diagnosis not present

## 2018-06-05 DIAGNOSIS — Z5181 Encounter for therapeutic drug level monitoring: Secondary | ICD-10-CM | POA: Diagnosis not present

## 2018-06-07 DIAGNOSIS — Z5181 Encounter for therapeutic drug level monitoring: Secondary | ICD-10-CM | POA: Diagnosis not present

## 2018-06-07 DIAGNOSIS — N2581 Secondary hyperparathyroidism of renal origin: Secondary | ICD-10-CM | POA: Diagnosis not present

## 2018-06-07 DIAGNOSIS — E039 Hypothyroidism, unspecified: Secondary | ICD-10-CM | POA: Diagnosis not present

## 2018-06-07 DIAGNOSIS — D509 Iron deficiency anemia, unspecified: Secondary | ICD-10-CM | POA: Diagnosis not present

## 2018-06-07 DIAGNOSIS — D631 Anemia in chronic kidney disease: Secondary | ICD-10-CM | POA: Diagnosis not present

## 2018-06-07 DIAGNOSIS — I482 Chronic atrial fibrillation, unspecified: Secondary | ICD-10-CM | POA: Diagnosis not present

## 2018-06-07 DIAGNOSIS — N186 End stage renal disease: Secondary | ICD-10-CM | POA: Diagnosis not present

## 2018-06-07 DIAGNOSIS — D688 Other specified coagulation defects: Secondary | ICD-10-CM | POA: Diagnosis not present

## 2018-06-09 DIAGNOSIS — N2581 Secondary hyperparathyroidism of renal origin: Secondary | ICD-10-CM | POA: Diagnosis not present

## 2018-06-09 DIAGNOSIS — D631 Anemia in chronic kidney disease: Secondary | ICD-10-CM | POA: Diagnosis not present

## 2018-06-09 DIAGNOSIS — N186 End stage renal disease: Secondary | ICD-10-CM | POA: Diagnosis not present

## 2018-06-09 DIAGNOSIS — E039 Hypothyroidism, unspecified: Secondary | ICD-10-CM | POA: Diagnosis not present

## 2018-06-09 DIAGNOSIS — D688 Other specified coagulation defects: Secondary | ICD-10-CM | POA: Diagnosis not present

## 2018-06-09 DIAGNOSIS — I482 Chronic atrial fibrillation, unspecified: Secondary | ICD-10-CM | POA: Diagnosis not present

## 2018-06-09 DIAGNOSIS — D509 Iron deficiency anemia, unspecified: Secondary | ICD-10-CM | POA: Diagnosis not present

## 2018-06-09 DIAGNOSIS — Z5181 Encounter for therapeutic drug level monitoring: Secondary | ICD-10-CM | POA: Diagnosis not present

## 2018-06-11 DIAGNOSIS — N2581 Secondary hyperparathyroidism of renal origin: Secondary | ICD-10-CM | POA: Diagnosis not present

## 2018-06-11 DIAGNOSIS — D509 Iron deficiency anemia, unspecified: Secondary | ICD-10-CM | POA: Diagnosis not present

## 2018-06-11 DIAGNOSIS — E039 Hypothyroidism, unspecified: Secondary | ICD-10-CM | POA: Diagnosis not present

## 2018-06-11 DIAGNOSIS — D688 Other specified coagulation defects: Secondary | ICD-10-CM | POA: Diagnosis not present

## 2018-06-11 DIAGNOSIS — N186 End stage renal disease: Secondary | ICD-10-CM | POA: Diagnosis not present

## 2018-06-11 DIAGNOSIS — D631 Anemia in chronic kidney disease: Secondary | ICD-10-CM | POA: Diagnosis not present

## 2018-06-11 DIAGNOSIS — I482 Chronic atrial fibrillation, unspecified: Secondary | ICD-10-CM | POA: Diagnosis not present

## 2018-06-11 DIAGNOSIS — Z5181 Encounter for therapeutic drug level monitoring: Secondary | ICD-10-CM | POA: Diagnosis not present

## 2018-06-14 DIAGNOSIS — D688 Other specified coagulation defects: Secondary | ICD-10-CM | POA: Diagnosis not present

## 2018-06-14 DIAGNOSIS — E039 Hypothyroidism, unspecified: Secondary | ICD-10-CM | POA: Diagnosis not present

## 2018-06-14 DIAGNOSIS — N2581 Secondary hyperparathyroidism of renal origin: Secondary | ICD-10-CM | POA: Diagnosis not present

## 2018-06-14 DIAGNOSIS — D509 Iron deficiency anemia, unspecified: Secondary | ICD-10-CM | POA: Diagnosis not present

## 2018-06-14 DIAGNOSIS — I482 Chronic atrial fibrillation, unspecified: Secondary | ICD-10-CM | POA: Diagnosis not present

## 2018-06-14 DIAGNOSIS — D631 Anemia in chronic kidney disease: Secondary | ICD-10-CM | POA: Diagnosis not present

## 2018-06-14 DIAGNOSIS — Z5181 Encounter for therapeutic drug level monitoring: Secondary | ICD-10-CM | POA: Diagnosis not present

## 2018-06-14 DIAGNOSIS — N186 End stage renal disease: Secondary | ICD-10-CM | POA: Diagnosis not present

## 2018-06-16 DIAGNOSIS — Z5181 Encounter for therapeutic drug level monitoring: Secondary | ICD-10-CM | POA: Diagnosis not present

## 2018-06-16 DIAGNOSIS — N2581 Secondary hyperparathyroidism of renal origin: Secondary | ICD-10-CM | POA: Diagnosis not present

## 2018-06-16 DIAGNOSIS — E039 Hypothyroidism, unspecified: Secondary | ICD-10-CM | POA: Diagnosis not present

## 2018-06-16 DIAGNOSIS — N186 End stage renal disease: Secondary | ICD-10-CM | POA: Diagnosis not present

## 2018-06-16 DIAGNOSIS — D509 Iron deficiency anemia, unspecified: Secondary | ICD-10-CM | POA: Diagnosis not present

## 2018-06-16 DIAGNOSIS — I482 Chronic atrial fibrillation, unspecified: Secondary | ICD-10-CM | POA: Diagnosis not present

## 2018-06-16 DIAGNOSIS — D631 Anemia in chronic kidney disease: Secondary | ICD-10-CM | POA: Diagnosis not present

## 2018-06-16 DIAGNOSIS — D688 Other specified coagulation defects: Secondary | ICD-10-CM | POA: Diagnosis not present

## 2018-06-17 ENCOUNTER — Other Ambulatory Visit: Payer: Self-pay | Admitting: Interventional Cardiology

## 2018-06-17 DIAGNOSIS — I480 Paroxysmal atrial fibrillation: Secondary | ICD-10-CM

## 2018-06-18 DIAGNOSIS — N186 End stage renal disease: Secondary | ICD-10-CM | POA: Diagnosis not present

## 2018-06-18 DIAGNOSIS — I482 Chronic atrial fibrillation, unspecified: Secondary | ICD-10-CM | POA: Diagnosis not present

## 2018-06-18 DIAGNOSIS — Z5181 Encounter for therapeutic drug level monitoring: Secondary | ICD-10-CM | POA: Diagnosis not present

## 2018-06-18 DIAGNOSIS — E039 Hypothyroidism, unspecified: Secondary | ICD-10-CM | POA: Diagnosis not present

## 2018-06-18 DIAGNOSIS — D688 Other specified coagulation defects: Secondary | ICD-10-CM | POA: Diagnosis not present

## 2018-06-18 DIAGNOSIS — D509 Iron deficiency anemia, unspecified: Secondary | ICD-10-CM | POA: Diagnosis not present

## 2018-06-18 DIAGNOSIS — N2581 Secondary hyperparathyroidism of renal origin: Secondary | ICD-10-CM | POA: Diagnosis not present

## 2018-06-18 DIAGNOSIS — D631 Anemia in chronic kidney disease: Secondary | ICD-10-CM | POA: Diagnosis not present

## 2018-06-19 DIAGNOSIS — I129 Hypertensive chronic kidney disease with stage 1 through stage 4 chronic kidney disease, or unspecified chronic kidney disease: Secondary | ICD-10-CM | POA: Diagnosis not present

## 2018-06-19 DIAGNOSIS — N186 End stage renal disease: Secondary | ICD-10-CM | POA: Diagnosis not present

## 2018-06-19 DIAGNOSIS — Z992 Dependence on renal dialysis: Secondary | ICD-10-CM | POA: Diagnosis not present

## 2018-06-21 DIAGNOSIS — I482 Chronic atrial fibrillation, unspecified: Secondary | ICD-10-CM | POA: Diagnosis not present

## 2018-06-21 DIAGNOSIS — D509 Iron deficiency anemia, unspecified: Secondary | ICD-10-CM | POA: Diagnosis not present

## 2018-06-21 DIAGNOSIS — D631 Anemia in chronic kidney disease: Secondary | ICD-10-CM | POA: Diagnosis not present

## 2018-06-21 DIAGNOSIS — N186 End stage renal disease: Secondary | ICD-10-CM | POA: Diagnosis not present

## 2018-06-21 DIAGNOSIS — Z5181 Encounter for therapeutic drug level monitoring: Secondary | ICD-10-CM | POA: Diagnosis not present

## 2018-06-21 DIAGNOSIS — N2581 Secondary hyperparathyroidism of renal origin: Secondary | ICD-10-CM | POA: Diagnosis not present

## 2018-06-21 DIAGNOSIS — D688 Other specified coagulation defects: Secondary | ICD-10-CM | POA: Diagnosis not present

## 2018-06-21 DIAGNOSIS — E039 Hypothyroidism, unspecified: Secondary | ICD-10-CM | POA: Diagnosis not present

## 2018-06-23 DIAGNOSIS — N2581 Secondary hyperparathyroidism of renal origin: Secondary | ICD-10-CM | POA: Diagnosis not present

## 2018-06-23 DIAGNOSIS — N186 End stage renal disease: Secondary | ICD-10-CM | POA: Diagnosis not present

## 2018-06-23 DIAGNOSIS — Z5181 Encounter for therapeutic drug level monitoring: Secondary | ICD-10-CM | POA: Diagnosis not present

## 2018-06-23 DIAGNOSIS — I482 Chronic atrial fibrillation, unspecified: Secondary | ICD-10-CM | POA: Diagnosis not present

## 2018-06-23 DIAGNOSIS — E039 Hypothyroidism, unspecified: Secondary | ICD-10-CM | POA: Diagnosis not present

## 2018-06-23 DIAGNOSIS — D631 Anemia in chronic kidney disease: Secondary | ICD-10-CM | POA: Diagnosis not present

## 2018-06-23 DIAGNOSIS — D688 Other specified coagulation defects: Secondary | ICD-10-CM | POA: Diagnosis not present

## 2018-06-23 DIAGNOSIS — D509 Iron deficiency anemia, unspecified: Secondary | ICD-10-CM | POA: Diagnosis not present

## 2018-06-26 DIAGNOSIS — D688 Other specified coagulation defects: Secondary | ICD-10-CM | POA: Diagnosis not present

## 2018-06-26 DIAGNOSIS — D509 Iron deficiency anemia, unspecified: Secondary | ICD-10-CM | POA: Diagnosis not present

## 2018-06-26 DIAGNOSIS — E039 Hypothyroidism, unspecified: Secondary | ICD-10-CM | POA: Diagnosis not present

## 2018-06-26 DIAGNOSIS — Z5181 Encounter for therapeutic drug level monitoring: Secondary | ICD-10-CM | POA: Diagnosis not present

## 2018-06-26 DIAGNOSIS — N186 End stage renal disease: Secondary | ICD-10-CM | POA: Diagnosis not present

## 2018-06-26 DIAGNOSIS — D631 Anemia in chronic kidney disease: Secondary | ICD-10-CM | POA: Diagnosis not present

## 2018-06-26 DIAGNOSIS — N2581 Secondary hyperparathyroidism of renal origin: Secondary | ICD-10-CM | POA: Diagnosis not present

## 2018-06-26 DIAGNOSIS — I482 Chronic atrial fibrillation, unspecified: Secondary | ICD-10-CM | POA: Diagnosis not present

## 2018-06-28 DIAGNOSIS — N186 End stage renal disease: Secondary | ICD-10-CM | POA: Diagnosis not present

## 2018-06-28 DIAGNOSIS — E039 Hypothyroidism, unspecified: Secondary | ICD-10-CM | POA: Diagnosis not present

## 2018-06-28 DIAGNOSIS — D688 Other specified coagulation defects: Secondary | ICD-10-CM | POA: Diagnosis not present

## 2018-06-28 DIAGNOSIS — Z5181 Encounter for therapeutic drug level monitoring: Secondary | ICD-10-CM | POA: Diagnosis not present

## 2018-06-28 DIAGNOSIS — N2581 Secondary hyperparathyroidism of renal origin: Secondary | ICD-10-CM | POA: Diagnosis not present

## 2018-06-28 DIAGNOSIS — D631 Anemia in chronic kidney disease: Secondary | ICD-10-CM | POA: Diagnosis not present

## 2018-06-28 DIAGNOSIS — D509 Iron deficiency anemia, unspecified: Secondary | ICD-10-CM | POA: Diagnosis not present

## 2018-06-28 DIAGNOSIS — I482 Chronic atrial fibrillation, unspecified: Secondary | ICD-10-CM | POA: Diagnosis not present

## 2018-06-30 DIAGNOSIS — D631 Anemia in chronic kidney disease: Secondary | ICD-10-CM | POA: Diagnosis not present

## 2018-06-30 DIAGNOSIS — D688 Other specified coagulation defects: Secondary | ICD-10-CM | POA: Diagnosis not present

## 2018-06-30 DIAGNOSIS — D509 Iron deficiency anemia, unspecified: Secondary | ICD-10-CM | POA: Diagnosis not present

## 2018-06-30 DIAGNOSIS — E039 Hypothyroidism, unspecified: Secondary | ICD-10-CM | POA: Diagnosis not present

## 2018-06-30 DIAGNOSIS — I482 Chronic atrial fibrillation, unspecified: Secondary | ICD-10-CM | POA: Diagnosis not present

## 2018-06-30 DIAGNOSIS — Z5181 Encounter for therapeutic drug level monitoring: Secondary | ICD-10-CM | POA: Diagnosis not present

## 2018-06-30 DIAGNOSIS — N186 End stage renal disease: Secondary | ICD-10-CM | POA: Diagnosis not present

## 2018-06-30 DIAGNOSIS — N2581 Secondary hyperparathyroidism of renal origin: Secondary | ICD-10-CM | POA: Diagnosis not present

## 2018-07-03 DIAGNOSIS — E039 Hypothyroidism, unspecified: Secondary | ICD-10-CM | POA: Diagnosis not present

## 2018-07-03 DIAGNOSIS — N186 End stage renal disease: Secondary | ICD-10-CM | POA: Diagnosis not present

## 2018-07-03 DIAGNOSIS — D631 Anemia in chronic kidney disease: Secondary | ICD-10-CM | POA: Diagnosis not present

## 2018-07-03 DIAGNOSIS — D688 Other specified coagulation defects: Secondary | ICD-10-CM | POA: Diagnosis not present

## 2018-07-03 DIAGNOSIS — N2581 Secondary hyperparathyroidism of renal origin: Secondary | ICD-10-CM | POA: Diagnosis not present

## 2018-07-03 DIAGNOSIS — I482 Chronic atrial fibrillation, unspecified: Secondary | ICD-10-CM | POA: Diagnosis not present

## 2018-07-03 DIAGNOSIS — D509 Iron deficiency anemia, unspecified: Secondary | ICD-10-CM | POA: Diagnosis not present

## 2018-07-03 DIAGNOSIS — Z5181 Encounter for therapeutic drug level monitoring: Secondary | ICD-10-CM | POA: Diagnosis not present

## 2018-07-05 DIAGNOSIS — E039 Hypothyroidism, unspecified: Secondary | ICD-10-CM | POA: Diagnosis not present

## 2018-07-05 DIAGNOSIS — N2581 Secondary hyperparathyroidism of renal origin: Secondary | ICD-10-CM | POA: Diagnosis not present

## 2018-07-05 DIAGNOSIS — I482 Chronic atrial fibrillation, unspecified: Secondary | ICD-10-CM | POA: Diagnosis not present

## 2018-07-05 DIAGNOSIS — N186 End stage renal disease: Secondary | ICD-10-CM | POA: Diagnosis not present

## 2018-07-05 DIAGNOSIS — D688 Other specified coagulation defects: Secondary | ICD-10-CM | POA: Diagnosis not present

## 2018-07-05 DIAGNOSIS — D509 Iron deficiency anemia, unspecified: Secondary | ICD-10-CM | POA: Diagnosis not present

## 2018-07-05 DIAGNOSIS — D631 Anemia in chronic kidney disease: Secondary | ICD-10-CM | POA: Diagnosis not present

## 2018-07-05 DIAGNOSIS — Z5181 Encounter for therapeutic drug level monitoring: Secondary | ICD-10-CM | POA: Diagnosis not present

## 2018-07-07 DIAGNOSIS — N2581 Secondary hyperparathyroidism of renal origin: Secondary | ICD-10-CM | POA: Diagnosis not present

## 2018-07-07 DIAGNOSIS — D509 Iron deficiency anemia, unspecified: Secondary | ICD-10-CM | POA: Diagnosis not present

## 2018-07-07 DIAGNOSIS — Z5181 Encounter for therapeutic drug level monitoring: Secondary | ICD-10-CM | POA: Diagnosis not present

## 2018-07-07 DIAGNOSIS — E039 Hypothyroidism, unspecified: Secondary | ICD-10-CM | POA: Diagnosis not present

## 2018-07-07 DIAGNOSIS — D631 Anemia in chronic kidney disease: Secondary | ICD-10-CM | POA: Diagnosis not present

## 2018-07-07 DIAGNOSIS — N186 End stage renal disease: Secondary | ICD-10-CM | POA: Diagnosis not present

## 2018-07-07 DIAGNOSIS — D688 Other specified coagulation defects: Secondary | ICD-10-CM | POA: Diagnosis not present

## 2018-07-07 DIAGNOSIS — I482 Chronic atrial fibrillation, unspecified: Secondary | ICD-10-CM | POA: Diagnosis not present

## 2018-07-10 DIAGNOSIS — N186 End stage renal disease: Secondary | ICD-10-CM | POA: Diagnosis not present

## 2018-07-10 DIAGNOSIS — I482 Chronic atrial fibrillation, unspecified: Secondary | ICD-10-CM | POA: Diagnosis not present

## 2018-07-10 DIAGNOSIS — D688 Other specified coagulation defects: Secondary | ICD-10-CM | POA: Diagnosis not present

## 2018-07-10 DIAGNOSIS — E039 Hypothyroidism, unspecified: Secondary | ICD-10-CM | POA: Diagnosis not present

## 2018-07-10 DIAGNOSIS — N2581 Secondary hyperparathyroidism of renal origin: Secondary | ICD-10-CM | POA: Diagnosis not present

## 2018-07-10 DIAGNOSIS — D509 Iron deficiency anemia, unspecified: Secondary | ICD-10-CM | POA: Diagnosis not present

## 2018-07-10 DIAGNOSIS — D631 Anemia in chronic kidney disease: Secondary | ICD-10-CM | POA: Diagnosis not present

## 2018-07-10 DIAGNOSIS — Z5181 Encounter for therapeutic drug level monitoring: Secondary | ICD-10-CM | POA: Diagnosis not present

## 2018-07-12 DIAGNOSIS — D688 Other specified coagulation defects: Secondary | ICD-10-CM | POA: Diagnosis not present

## 2018-07-12 DIAGNOSIS — Z5181 Encounter for therapeutic drug level monitoring: Secondary | ICD-10-CM | POA: Diagnosis not present

## 2018-07-12 DIAGNOSIS — N2581 Secondary hyperparathyroidism of renal origin: Secondary | ICD-10-CM | POA: Diagnosis not present

## 2018-07-12 DIAGNOSIS — D509 Iron deficiency anemia, unspecified: Secondary | ICD-10-CM | POA: Diagnosis not present

## 2018-07-12 DIAGNOSIS — E039 Hypothyroidism, unspecified: Secondary | ICD-10-CM | POA: Diagnosis not present

## 2018-07-12 DIAGNOSIS — N186 End stage renal disease: Secondary | ICD-10-CM | POA: Diagnosis not present

## 2018-07-12 DIAGNOSIS — I482 Chronic atrial fibrillation, unspecified: Secondary | ICD-10-CM | POA: Diagnosis not present

## 2018-07-12 DIAGNOSIS — D631 Anemia in chronic kidney disease: Secondary | ICD-10-CM | POA: Diagnosis not present

## 2018-07-13 ENCOUNTER — Ambulatory Visit: Payer: Medicare Other

## 2018-07-13 DIAGNOSIS — Z5181 Encounter for therapeutic drug level monitoring: Secondary | ICD-10-CM

## 2018-07-13 DIAGNOSIS — I4891 Unspecified atrial fibrillation: Secondary | ICD-10-CM

## 2018-07-13 DIAGNOSIS — I480 Paroxysmal atrial fibrillation: Secondary | ICD-10-CM

## 2018-07-13 LAB — POCT INR: INR: 2.3 (ref 2.0–3.0)

## 2018-07-13 NOTE — Patient Instructions (Signed)
Description   Continue taking 1/2 tablet daily except 1 tablet on Mondays, Wednesdays, and Fridays. Recheck INR in 6 weeks. Call with any new medications or if scheduled for any procedures or with any questions [phone]

## 2018-07-14 DIAGNOSIS — Z5181 Encounter for therapeutic drug level monitoring: Secondary | ICD-10-CM | POA: Diagnosis not present

## 2018-07-14 DIAGNOSIS — D631 Anemia in chronic kidney disease: Secondary | ICD-10-CM | POA: Diagnosis not present

## 2018-07-14 DIAGNOSIS — N2581 Secondary hyperparathyroidism of renal origin: Secondary | ICD-10-CM | POA: Diagnosis not present

## 2018-07-14 DIAGNOSIS — D688 Other specified coagulation defects: Secondary | ICD-10-CM | POA: Diagnosis not present

## 2018-07-14 DIAGNOSIS — E039 Hypothyroidism, unspecified: Secondary | ICD-10-CM | POA: Diagnosis not present

## 2018-07-14 DIAGNOSIS — D509 Iron deficiency anemia, unspecified: Secondary | ICD-10-CM | POA: Diagnosis not present

## 2018-07-14 DIAGNOSIS — I482 Chronic atrial fibrillation, unspecified: Secondary | ICD-10-CM | POA: Diagnosis not present

## 2018-07-14 DIAGNOSIS — N186 End stage renal disease: Secondary | ICD-10-CM | POA: Diagnosis not present

## 2018-07-17 DIAGNOSIS — E039 Hypothyroidism, unspecified: Secondary | ICD-10-CM | POA: Diagnosis not present

## 2018-07-17 DIAGNOSIS — D509 Iron deficiency anemia, unspecified: Secondary | ICD-10-CM | POA: Diagnosis not present

## 2018-07-17 DIAGNOSIS — N2581 Secondary hyperparathyroidism of renal origin: Secondary | ICD-10-CM | POA: Diagnosis not present

## 2018-07-17 DIAGNOSIS — D631 Anemia in chronic kidney disease: Secondary | ICD-10-CM | POA: Diagnosis not present

## 2018-07-17 DIAGNOSIS — Z5181 Encounter for therapeutic drug level monitoring: Secondary | ICD-10-CM | POA: Diagnosis not present

## 2018-07-17 DIAGNOSIS — N186 End stage renal disease: Secondary | ICD-10-CM | POA: Diagnosis not present

## 2018-07-17 DIAGNOSIS — I482 Chronic atrial fibrillation, unspecified: Secondary | ICD-10-CM | POA: Diagnosis not present

## 2018-07-17 DIAGNOSIS — D688 Other specified coagulation defects: Secondary | ICD-10-CM | POA: Diagnosis not present

## 2018-07-19 DIAGNOSIS — D631 Anemia in chronic kidney disease: Secondary | ICD-10-CM | POA: Diagnosis not present

## 2018-07-19 DIAGNOSIS — N2581 Secondary hyperparathyroidism of renal origin: Secondary | ICD-10-CM | POA: Diagnosis not present

## 2018-07-19 DIAGNOSIS — Z5181 Encounter for therapeutic drug level monitoring: Secondary | ICD-10-CM | POA: Diagnosis not present

## 2018-07-19 DIAGNOSIS — D688 Other specified coagulation defects: Secondary | ICD-10-CM | POA: Diagnosis not present

## 2018-07-19 DIAGNOSIS — E039 Hypothyroidism, unspecified: Secondary | ICD-10-CM | POA: Diagnosis not present

## 2018-07-19 DIAGNOSIS — D509 Iron deficiency anemia, unspecified: Secondary | ICD-10-CM | POA: Diagnosis not present

## 2018-07-19 DIAGNOSIS — N186 End stage renal disease: Secondary | ICD-10-CM | POA: Diagnosis not present

## 2018-07-19 DIAGNOSIS — I482 Chronic atrial fibrillation, unspecified: Secondary | ICD-10-CM | POA: Diagnosis not present

## 2018-07-20 DIAGNOSIS — Z992 Dependence on renal dialysis: Secondary | ICD-10-CM | POA: Diagnosis not present

## 2018-07-20 DIAGNOSIS — I129 Hypertensive chronic kidney disease with stage 1 through stage 4 chronic kidney disease, or unspecified chronic kidney disease: Secondary | ICD-10-CM | POA: Diagnosis not present

## 2018-07-20 DIAGNOSIS — N186 End stage renal disease: Secondary | ICD-10-CM | POA: Diagnosis not present

## 2018-07-21 DIAGNOSIS — N186 End stage renal disease: Secondary | ICD-10-CM | POA: Diagnosis not present

## 2018-07-21 DIAGNOSIS — Z23 Encounter for immunization: Secondary | ICD-10-CM | POA: Diagnosis not present

## 2018-07-21 DIAGNOSIS — D509 Iron deficiency anemia, unspecified: Secondary | ICD-10-CM | POA: Diagnosis not present

## 2018-07-21 DIAGNOSIS — I482 Chronic atrial fibrillation, unspecified: Secondary | ICD-10-CM | POA: Diagnosis not present

## 2018-07-21 DIAGNOSIS — E039 Hypothyroidism, unspecified: Secondary | ICD-10-CM | POA: Diagnosis not present

## 2018-07-21 DIAGNOSIS — Z5181 Encounter for therapeutic drug level monitoring: Secondary | ICD-10-CM | POA: Diagnosis not present

## 2018-07-21 DIAGNOSIS — D688 Other specified coagulation defects: Secondary | ICD-10-CM | POA: Diagnosis not present

## 2018-07-21 DIAGNOSIS — N2581 Secondary hyperparathyroidism of renal origin: Secondary | ICD-10-CM | POA: Diagnosis not present

## 2018-07-24 DIAGNOSIS — Z23 Encounter for immunization: Secondary | ICD-10-CM | POA: Diagnosis not present

## 2018-07-24 DIAGNOSIS — N2581 Secondary hyperparathyroidism of renal origin: Secondary | ICD-10-CM | POA: Diagnosis not present

## 2018-07-24 DIAGNOSIS — N186 End stage renal disease: Secondary | ICD-10-CM | POA: Diagnosis not present

## 2018-07-24 DIAGNOSIS — E039 Hypothyroidism, unspecified: Secondary | ICD-10-CM | POA: Diagnosis not present

## 2018-07-24 DIAGNOSIS — I482 Chronic atrial fibrillation, unspecified: Secondary | ICD-10-CM | POA: Diagnosis not present

## 2018-07-24 DIAGNOSIS — D509 Iron deficiency anemia, unspecified: Secondary | ICD-10-CM | POA: Diagnosis not present

## 2018-07-24 DIAGNOSIS — D688 Other specified coagulation defects: Secondary | ICD-10-CM | POA: Diagnosis not present

## 2018-07-24 DIAGNOSIS — Z5181 Encounter for therapeutic drug level monitoring: Secondary | ICD-10-CM | POA: Diagnosis not present

## 2018-07-26 DIAGNOSIS — I482 Chronic atrial fibrillation, unspecified: Secondary | ICD-10-CM | POA: Diagnosis not present

## 2018-07-26 DIAGNOSIS — Z23 Encounter for immunization: Secondary | ICD-10-CM | POA: Diagnosis not present

## 2018-07-26 DIAGNOSIS — E039 Hypothyroidism, unspecified: Secondary | ICD-10-CM | POA: Diagnosis not present

## 2018-07-26 DIAGNOSIS — D688 Other specified coagulation defects: Secondary | ICD-10-CM | POA: Diagnosis not present

## 2018-07-26 DIAGNOSIS — N2581 Secondary hyperparathyroidism of renal origin: Secondary | ICD-10-CM | POA: Diagnosis not present

## 2018-07-26 DIAGNOSIS — Z5181 Encounter for therapeutic drug level monitoring: Secondary | ICD-10-CM | POA: Diagnosis not present

## 2018-07-26 DIAGNOSIS — D509 Iron deficiency anemia, unspecified: Secondary | ICD-10-CM | POA: Diagnosis not present

## 2018-07-26 DIAGNOSIS — N186 End stage renal disease: Secondary | ICD-10-CM | POA: Diagnosis not present

## 2018-07-28 DIAGNOSIS — N186 End stage renal disease: Secondary | ICD-10-CM | POA: Diagnosis not present

## 2018-07-28 DIAGNOSIS — D688 Other specified coagulation defects: Secondary | ICD-10-CM | POA: Diagnosis not present

## 2018-07-28 DIAGNOSIS — N2581 Secondary hyperparathyroidism of renal origin: Secondary | ICD-10-CM | POA: Diagnosis not present

## 2018-07-28 DIAGNOSIS — Z5181 Encounter for therapeutic drug level monitoring: Secondary | ICD-10-CM | POA: Diagnosis not present

## 2018-07-28 DIAGNOSIS — Z23 Encounter for immunization: Secondary | ICD-10-CM | POA: Diagnosis not present

## 2018-07-28 DIAGNOSIS — E039 Hypothyroidism, unspecified: Secondary | ICD-10-CM | POA: Diagnosis not present

## 2018-07-28 DIAGNOSIS — D509 Iron deficiency anemia, unspecified: Secondary | ICD-10-CM | POA: Diagnosis not present

## 2018-07-28 DIAGNOSIS — I482 Chronic atrial fibrillation, unspecified: Secondary | ICD-10-CM | POA: Diagnosis not present

## 2018-07-31 DIAGNOSIS — I482 Chronic atrial fibrillation, unspecified: Secondary | ICD-10-CM | POA: Diagnosis not present

## 2018-07-31 DIAGNOSIS — N186 End stage renal disease: Secondary | ICD-10-CM | POA: Diagnosis not present

## 2018-07-31 DIAGNOSIS — N2581 Secondary hyperparathyroidism of renal origin: Secondary | ICD-10-CM | POA: Diagnosis not present

## 2018-07-31 DIAGNOSIS — Z5181 Encounter for therapeutic drug level monitoring: Secondary | ICD-10-CM | POA: Diagnosis not present

## 2018-07-31 DIAGNOSIS — Z23 Encounter for immunization: Secondary | ICD-10-CM | POA: Diagnosis not present

## 2018-07-31 DIAGNOSIS — D688 Other specified coagulation defects: Secondary | ICD-10-CM | POA: Diagnosis not present

## 2018-07-31 DIAGNOSIS — E039 Hypothyroidism, unspecified: Secondary | ICD-10-CM | POA: Diagnosis not present

## 2018-07-31 DIAGNOSIS — D509 Iron deficiency anemia, unspecified: Secondary | ICD-10-CM | POA: Diagnosis not present

## 2018-08-02 DIAGNOSIS — D509 Iron deficiency anemia, unspecified: Secondary | ICD-10-CM | POA: Diagnosis not present

## 2018-08-02 DIAGNOSIS — I482 Chronic atrial fibrillation, unspecified: Secondary | ICD-10-CM | POA: Diagnosis not present

## 2018-08-02 DIAGNOSIS — N186 End stage renal disease: Secondary | ICD-10-CM | POA: Diagnosis not present

## 2018-08-02 DIAGNOSIS — Z5181 Encounter for therapeutic drug level monitoring: Secondary | ICD-10-CM | POA: Diagnosis not present

## 2018-08-02 DIAGNOSIS — E039 Hypothyroidism, unspecified: Secondary | ICD-10-CM | POA: Diagnosis not present

## 2018-08-02 DIAGNOSIS — N2581 Secondary hyperparathyroidism of renal origin: Secondary | ICD-10-CM | POA: Diagnosis not present

## 2018-08-02 DIAGNOSIS — D688 Other specified coagulation defects: Secondary | ICD-10-CM | POA: Diagnosis not present

## 2018-08-02 DIAGNOSIS — Z23 Encounter for immunization: Secondary | ICD-10-CM | POA: Diagnosis not present

## 2018-08-04 DIAGNOSIS — I482 Chronic atrial fibrillation, unspecified: Secondary | ICD-10-CM | POA: Diagnosis not present

## 2018-08-04 DIAGNOSIS — Z23 Encounter for immunization: Secondary | ICD-10-CM | POA: Diagnosis not present

## 2018-08-04 DIAGNOSIS — N2581 Secondary hyperparathyroidism of renal origin: Secondary | ICD-10-CM | POA: Diagnosis not present

## 2018-08-04 DIAGNOSIS — N186 End stage renal disease: Secondary | ICD-10-CM | POA: Diagnosis not present

## 2018-08-04 DIAGNOSIS — Z5181 Encounter for therapeutic drug level monitoring: Secondary | ICD-10-CM | POA: Diagnosis not present

## 2018-08-04 DIAGNOSIS — E039 Hypothyroidism, unspecified: Secondary | ICD-10-CM | POA: Diagnosis not present

## 2018-08-04 DIAGNOSIS — D688 Other specified coagulation defects: Secondary | ICD-10-CM | POA: Diagnosis not present

## 2018-08-04 DIAGNOSIS — D509 Iron deficiency anemia, unspecified: Secondary | ICD-10-CM | POA: Diagnosis not present

## 2018-08-07 DIAGNOSIS — I482 Chronic atrial fibrillation, unspecified: Secondary | ICD-10-CM | POA: Diagnosis not present

## 2018-08-07 DIAGNOSIS — D688 Other specified coagulation defects: Secondary | ICD-10-CM | POA: Diagnosis not present

## 2018-08-07 DIAGNOSIS — N186 End stage renal disease: Secondary | ICD-10-CM | POA: Diagnosis not present

## 2018-08-07 DIAGNOSIS — D509 Iron deficiency anemia, unspecified: Secondary | ICD-10-CM | POA: Diagnosis not present

## 2018-08-07 DIAGNOSIS — Z5181 Encounter for therapeutic drug level monitoring: Secondary | ICD-10-CM | POA: Diagnosis not present

## 2018-08-07 DIAGNOSIS — E039 Hypothyroidism, unspecified: Secondary | ICD-10-CM | POA: Diagnosis not present

## 2018-08-07 DIAGNOSIS — N2581 Secondary hyperparathyroidism of renal origin: Secondary | ICD-10-CM | POA: Diagnosis not present

## 2018-08-07 DIAGNOSIS — Z23 Encounter for immunization: Secondary | ICD-10-CM | POA: Diagnosis not present

## 2018-08-08 DIAGNOSIS — N186 End stage renal disease: Secondary | ICD-10-CM | POA: Diagnosis not present

## 2018-08-08 DIAGNOSIS — I480 Paroxysmal atrial fibrillation: Secondary | ICD-10-CM | POA: Diagnosis not present

## 2018-08-08 DIAGNOSIS — I5032 Chronic diastolic (congestive) heart failure: Secondary | ICD-10-CM | POA: Diagnosis not present

## 2018-08-08 DIAGNOSIS — I129 Hypertensive chronic kidney disease with stage 1 through stage 4 chronic kidney disease, or unspecified chronic kidney disease: Secondary | ICD-10-CM | POA: Diagnosis not present

## 2018-08-09 DIAGNOSIS — D509 Iron deficiency anemia, unspecified: Secondary | ICD-10-CM | POA: Diagnosis not present

## 2018-08-09 DIAGNOSIS — E039 Hypothyroidism, unspecified: Secondary | ICD-10-CM | POA: Diagnosis not present

## 2018-08-09 DIAGNOSIS — Z23 Encounter for immunization: Secondary | ICD-10-CM | POA: Diagnosis not present

## 2018-08-09 DIAGNOSIS — N186 End stage renal disease: Secondary | ICD-10-CM | POA: Diagnosis not present

## 2018-08-09 DIAGNOSIS — I482 Chronic atrial fibrillation, unspecified: Secondary | ICD-10-CM | POA: Diagnosis not present

## 2018-08-09 DIAGNOSIS — Z5181 Encounter for therapeutic drug level monitoring: Secondary | ICD-10-CM | POA: Diagnosis not present

## 2018-08-09 DIAGNOSIS — N2581 Secondary hyperparathyroidism of renal origin: Secondary | ICD-10-CM | POA: Diagnosis not present

## 2018-08-09 DIAGNOSIS — D688 Other specified coagulation defects: Secondary | ICD-10-CM | POA: Diagnosis not present

## 2018-08-11 DIAGNOSIS — N186 End stage renal disease: Secondary | ICD-10-CM | POA: Diagnosis not present

## 2018-08-11 DIAGNOSIS — D509 Iron deficiency anemia, unspecified: Secondary | ICD-10-CM | POA: Diagnosis not present

## 2018-08-11 DIAGNOSIS — Z23 Encounter for immunization: Secondary | ICD-10-CM | POA: Diagnosis not present

## 2018-08-11 DIAGNOSIS — N2581 Secondary hyperparathyroidism of renal origin: Secondary | ICD-10-CM | POA: Diagnosis not present

## 2018-08-11 DIAGNOSIS — E039 Hypothyroidism, unspecified: Secondary | ICD-10-CM | POA: Diagnosis not present

## 2018-08-11 DIAGNOSIS — Z5181 Encounter for therapeutic drug level monitoring: Secondary | ICD-10-CM | POA: Diagnosis not present

## 2018-08-11 DIAGNOSIS — D688 Other specified coagulation defects: Secondary | ICD-10-CM | POA: Diagnosis not present

## 2018-08-11 DIAGNOSIS — I482 Chronic atrial fibrillation, unspecified: Secondary | ICD-10-CM | POA: Diagnosis not present

## 2018-08-14 DIAGNOSIS — I482 Chronic atrial fibrillation, unspecified: Secondary | ICD-10-CM | POA: Diagnosis not present

## 2018-08-14 DIAGNOSIS — Z23 Encounter for immunization: Secondary | ICD-10-CM | POA: Diagnosis not present

## 2018-08-14 DIAGNOSIS — N2581 Secondary hyperparathyroidism of renal origin: Secondary | ICD-10-CM | POA: Diagnosis not present

## 2018-08-14 DIAGNOSIS — Z5181 Encounter for therapeutic drug level monitoring: Secondary | ICD-10-CM | POA: Diagnosis not present

## 2018-08-14 DIAGNOSIS — D688 Other specified coagulation defects: Secondary | ICD-10-CM | POA: Diagnosis not present

## 2018-08-14 DIAGNOSIS — D509 Iron deficiency anemia, unspecified: Secondary | ICD-10-CM | POA: Diagnosis not present

## 2018-08-14 DIAGNOSIS — E039 Hypothyroidism, unspecified: Secondary | ICD-10-CM | POA: Diagnosis not present

## 2018-08-14 DIAGNOSIS — N186 End stage renal disease: Secondary | ICD-10-CM | POA: Diagnosis not present

## 2018-08-15 ENCOUNTER — Other Ambulatory Visit: Payer: Self-pay | Admitting: Interventional Cardiology

## 2018-08-15 NOTE — Telephone Encounter (Signed)
Pt is requesting refill on WARFARIN/COUMADIN 5 MG. Please address thank you.

## 2018-08-16 DIAGNOSIS — N2581 Secondary hyperparathyroidism of renal origin: Secondary | ICD-10-CM | POA: Diagnosis not present

## 2018-08-16 DIAGNOSIS — N186 End stage renal disease: Secondary | ICD-10-CM | POA: Diagnosis not present

## 2018-08-16 DIAGNOSIS — Z5181 Encounter for therapeutic drug level monitoring: Secondary | ICD-10-CM | POA: Diagnosis not present

## 2018-08-16 DIAGNOSIS — Z23 Encounter for immunization: Secondary | ICD-10-CM | POA: Diagnosis not present

## 2018-08-16 DIAGNOSIS — D688 Other specified coagulation defects: Secondary | ICD-10-CM | POA: Diagnosis not present

## 2018-08-16 DIAGNOSIS — D509 Iron deficiency anemia, unspecified: Secondary | ICD-10-CM | POA: Diagnosis not present

## 2018-08-16 DIAGNOSIS — I482 Chronic atrial fibrillation, unspecified: Secondary | ICD-10-CM | POA: Diagnosis not present

## 2018-08-16 DIAGNOSIS — E039 Hypothyroidism, unspecified: Secondary | ICD-10-CM | POA: Diagnosis not present

## 2018-08-18 DIAGNOSIS — D509 Iron deficiency anemia, unspecified: Secondary | ICD-10-CM | POA: Diagnosis not present

## 2018-08-18 DIAGNOSIS — Z23 Encounter for immunization: Secondary | ICD-10-CM | POA: Diagnosis not present

## 2018-08-18 DIAGNOSIS — E039 Hypothyroidism, unspecified: Secondary | ICD-10-CM | POA: Diagnosis not present

## 2018-08-18 DIAGNOSIS — I129 Hypertensive chronic kidney disease with stage 1 through stage 4 chronic kidney disease, or unspecified chronic kidney disease: Secondary | ICD-10-CM | POA: Diagnosis not present

## 2018-08-18 DIAGNOSIS — N2581 Secondary hyperparathyroidism of renal origin: Secondary | ICD-10-CM | POA: Diagnosis not present

## 2018-08-18 DIAGNOSIS — Z5181 Encounter for therapeutic drug level monitoring: Secondary | ICD-10-CM | POA: Diagnosis not present

## 2018-08-18 DIAGNOSIS — D688 Other specified coagulation defects: Secondary | ICD-10-CM | POA: Diagnosis not present

## 2018-08-18 DIAGNOSIS — I482 Chronic atrial fibrillation, unspecified: Secondary | ICD-10-CM | POA: Diagnosis not present

## 2018-08-18 DIAGNOSIS — N186 End stage renal disease: Secondary | ICD-10-CM | POA: Diagnosis not present

## 2018-08-18 DIAGNOSIS — Z992 Dependence on renal dialysis: Secondary | ICD-10-CM | POA: Diagnosis not present

## 2018-08-22 DIAGNOSIS — N2581 Secondary hyperparathyroidism of renal origin: Secondary | ICD-10-CM | POA: Diagnosis not present

## 2018-08-22 DIAGNOSIS — N186 End stage renal disease: Secondary | ICD-10-CM | POA: Diagnosis not present

## 2018-08-22 DIAGNOSIS — D688 Other specified coagulation defects: Secondary | ICD-10-CM | POA: Diagnosis not present

## 2018-08-22 DIAGNOSIS — D631 Anemia in chronic kidney disease: Secondary | ICD-10-CM | POA: Diagnosis not present

## 2018-08-22 DIAGNOSIS — E039 Hypothyroidism, unspecified: Secondary | ICD-10-CM | POA: Diagnosis not present

## 2018-08-22 DIAGNOSIS — D509 Iron deficiency anemia, unspecified: Secondary | ICD-10-CM | POA: Diagnosis not present

## 2018-08-22 DIAGNOSIS — Z5181 Encounter for therapeutic drug level monitoring: Secondary | ICD-10-CM | POA: Diagnosis not present

## 2018-08-22 DIAGNOSIS — I482 Chronic atrial fibrillation, unspecified: Secondary | ICD-10-CM | POA: Diagnosis not present

## 2018-08-23 ENCOUNTER — Encounter (HOSPITAL_COMMUNITY): Payer: Self-pay | Admitting: Internal Medicine

## 2018-08-23 ENCOUNTER — Inpatient Hospital Stay (HOSPITAL_COMMUNITY)
Admission: EM | Admit: 2018-08-23 | Discharge: 2018-08-27 | DRG: 391 | Disposition: A | Discharge status: Home / Self Care | Payer: Medicare Other | Attending: Internal Medicine | Admitting: Internal Medicine

## 2018-08-23 ENCOUNTER — Other Ambulatory Visit: Payer: Self-pay

## 2018-08-23 DIAGNOSIS — K921 Melena: Secondary | ICD-10-CM | POA: Diagnosis not present

## 2018-08-23 DIAGNOSIS — Z9841 Cataract extraction status, right eye: Secondary | ICD-10-CM

## 2018-08-23 DIAGNOSIS — I132 Hypertensive heart and chronic kidney disease with heart failure and with stage 5 chronic kidney disease, or end stage renal disease: Secondary | ICD-10-CM | POA: Diagnosis present

## 2018-08-23 DIAGNOSIS — Z7901 Long term (current) use of anticoagulants: Secondary | ICD-10-CM

## 2018-08-23 DIAGNOSIS — I5042 Chronic combined systolic (congestive) and diastolic (congestive) heart failure: Secondary | ICD-10-CM | POA: Diagnosis present

## 2018-08-23 DIAGNOSIS — Z9842 Cataract extraction status, left eye: Secondary | ICD-10-CM

## 2018-08-23 DIAGNOSIS — E785 Hyperlipidemia, unspecified: Secondary | ICD-10-CM | POA: Diagnosis present

## 2018-08-23 DIAGNOSIS — Z8052 Family history of malignant neoplasm of bladder: Secondary | ICD-10-CM

## 2018-08-23 DIAGNOSIS — N2581 Secondary hyperparathyroidism of renal origin: Secondary | ICD-10-CM | POA: Diagnosis present

## 2018-08-23 DIAGNOSIS — Z8249 Family history of ischemic heart disease and other diseases of the circulatory system: Secondary | ICD-10-CM | POA: Diagnosis not present

## 2018-08-23 DIAGNOSIS — K449 Diaphragmatic hernia without obstruction or gangrene: Secondary | ICD-10-CM | POA: Diagnosis present

## 2018-08-23 DIAGNOSIS — I480 Paroxysmal atrial fibrillation: Secondary | ICD-10-CM | POA: Diagnosis present

## 2018-08-23 DIAGNOSIS — Z87440 Personal history of urinary (tract) infections: Secondary | ICD-10-CM | POA: Diagnosis not present

## 2018-08-23 DIAGNOSIS — E876 Hypokalemia: Secondary | ICD-10-CM | POA: Diagnosis not present

## 2018-08-23 DIAGNOSIS — N189 Chronic kidney disease, unspecified: Secondary | ICD-10-CM | POA: Diagnosis not present

## 2018-08-23 DIAGNOSIS — Z885 Allergy status to narcotic agent status: Secondary | ICD-10-CM

## 2018-08-23 DIAGNOSIS — A0811 Acute gastroenteropathy due to Norwalk agent: Secondary | ICD-10-CM | POA: Diagnosis not present

## 2018-08-23 DIAGNOSIS — K219 Gastro-esophageal reflux disease without esophagitis: Secondary | ICD-10-CM | POA: Diagnosis present

## 2018-08-23 DIAGNOSIS — I959 Hypotension, unspecified: Secondary | ICD-10-CM | POA: Diagnosis not present

## 2018-08-23 DIAGNOSIS — Z87442 Personal history of urinary calculi: Secondary | ICD-10-CM | POA: Diagnosis not present

## 2018-08-23 DIAGNOSIS — Z992 Dependence on renal dialysis: Secondary | ICD-10-CM

## 2018-08-23 DIAGNOSIS — H409 Unspecified glaucoma: Secondary | ICD-10-CM | POA: Diagnosis present

## 2018-08-23 DIAGNOSIS — E039 Hypothyroidism, unspecified: Secondary | ICD-10-CM | POA: Diagnosis not present

## 2018-08-23 DIAGNOSIS — N186 End stage renal disease: Secondary | ICD-10-CM | POA: Diagnosis not present

## 2018-08-23 DIAGNOSIS — K922 Gastrointestinal hemorrhage, unspecified: Secondary | ICD-10-CM | POA: Diagnosis not present

## 2018-08-23 DIAGNOSIS — R011 Cardiac murmur, unspecified: Secondary | ICD-10-CM | POA: Diagnosis present

## 2018-08-23 DIAGNOSIS — K529 Noninfective gastroenteritis and colitis, unspecified: Principal | ICD-10-CM | POA: Diagnosis present

## 2018-08-23 DIAGNOSIS — D688 Other specified coagulation defects: Secondary | ICD-10-CM | POA: Diagnosis not present

## 2018-08-23 DIAGNOSIS — K222 Esophageal obstruction: Secondary | ICD-10-CM | POA: Diagnosis not present

## 2018-08-23 DIAGNOSIS — D631 Anemia in chronic kidney disease: Secondary | ICD-10-CM | POA: Diagnosis present

## 2018-08-23 DIAGNOSIS — Z90710 Acquired absence of both cervix and uterus: Secondary | ICD-10-CM

## 2018-08-23 DIAGNOSIS — R0902 Hypoxemia: Secondary | ICD-10-CM | POA: Diagnosis not present

## 2018-08-23 DIAGNOSIS — Z888 Allergy status to other drugs, medicaments and biological substances status: Secondary | ICD-10-CM | POA: Diagnosis not present

## 2018-08-23 DIAGNOSIS — I10 Essential (primary) hypertension: Secondary | ICD-10-CM | POA: Diagnosis not present

## 2018-08-23 DIAGNOSIS — D509 Iron deficiency anemia, unspecified: Secondary | ICD-10-CM | POA: Diagnosis not present

## 2018-08-23 DIAGNOSIS — Z90722 Acquired absence of ovaries, bilateral: Secondary | ICD-10-CM

## 2018-08-23 DIAGNOSIS — I429 Cardiomyopathy, unspecified: Secondary | ICD-10-CM | POA: Diagnosis not present

## 2018-08-23 DIAGNOSIS — Z79899 Other long term (current) drug therapy: Secondary | ICD-10-CM

## 2018-08-23 DIAGNOSIS — R195 Other fecal abnormalities: Secondary | ICD-10-CM | POA: Diagnosis not present

## 2018-08-23 DIAGNOSIS — I482 Chronic atrial fibrillation, unspecified: Secondary | ICD-10-CM | POA: Diagnosis not present

## 2018-08-23 DIAGNOSIS — I120 Hypertensive chronic kidney disease with stage 5 chronic kidney disease or end stage renal disease: Secondary | ICD-10-CM | POA: Diagnosis not present

## 2018-08-23 DIAGNOSIS — D649 Anemia, unspecified: Secondary | ICD-10-CM | POA: Diagnosis not present

## 2018-08-23 DIAGNOSIS — R197 Diarrhea, unspecified: Secondary | ICD-10-CM | POA: Diagnosis not present

## 2018-08-23 DIAGNOSIS — Z833 Family history of diabetes mellitus: Secondary | ICD-10-CM

## 2018-08-23 DIAGNOSIS — Z5181 Encounter for therapeutic drug level monitoring: Secondary | ICD-10-CM | POA: Diagnosis not present

## 2018-08-23 DIAGNOSIS — Z961 Presence of intraocular lens: Secondary | ICD-10-CM | POA: Diagnosis present

## 2018-08-23 HISTORY — DX: Gastrointestinal hemorrhage, unspecified: K92.2

## 2018-08-23 HISTORY — DX: Hypokalemia: E87.6

## 2018-08-23 LAB — CBC WITH DIFFERENTIAL/PLATELET
Abs Immature Granulocytes: 0.02 10*3/uL (ref 0.00–0.07)
Basophils Absolute: 0 10*3/uL (ref 0.0–0.1)
Basophils Relative: 0 %
Eosinophils Absolute: 0.1 10*3/uL (ref 0.0–0.5)
Eosinophils Relative: 2 %
HCT: 34.3 % — ABNORMAL LOW (ref 36.0–46.0)
Hemoglobin: 11.2 g/dL — ABNORMAL LOW (ref 12.0–15.0)
Immature Granulocytes: 1 %
Lymphocytes Relative: 20 %
Lymphs Abs: 0.9 10*3/uL (ref 0.7–4.0)
MCH: 30.7 pg (ref 26.0–34.0)
MCHC: 32.7 g/dL (ref 30.0–36.0)
MCV: 94 fL (ref 80.0–100.0)
Monocytes Absolute: 0.5 10*3/uL (ref 0.1–1.0)
Monocytes Relative: 12 %
Neutro Abs: 2.9 10*3/uL (ref 1.7–7.7)
Neutrophils Relative %: 65 %
Platelets: 191 10*3/uL (ref 150–400)
RBC: 3.65 MIL/uL — ABNORMAL LOW (ref 3.87–5.11)
RDW: 14.6 % (ref 11.5–15.5)
WBC: 4.4 10*3/uL (ref 4.0–10.5)
nRBC: 0 % (ref 0.0–0.2)

## 2018-08-23 LAB — MAGNESIUM: Magnesium: 1.9 mg/dL (ref 1.7–2.4)

## 2018-08-23 LAB — BASIC METABOLIC PANEL
Anion gap: 15 (ref 5–15)
BUN: 8 mg/dL (ref 8–23)
CO2: 27 mmol/L (ref 22–32)
Calcium: 7.6 mg/dL — ABNORMAL LOW (ref 8.9–10.3)
Chloride: 93 mmol/L — ABNORMAL LOW (ref 98–111)
Creatinine, Ser: 2.64 mg/dL — ABNORMAL HIGH (ref 0.44–1.00)
GFR calc Af Amer: 20 mL/min — ABNORMAL LOW (ref >60)
GFR calc non Af Amer: 17 mL/min — ABNORMAL LOW (ref >60)
Glucose, Bld: 69 mg/dL — ABNORMAL LOW (ref 70–99)
Potassium: 2.5 mmol/L — CL (ref 3.5–5.1)
Sodium: 135 mmol/L (ref 135–145)

## 2018-08-23 LAB — PROTIME-INR
INR: 2.3 — ABNORMAL HIGH (ref 0.8–1.2)
Prothrombin Time: 25 seconds — ABNORMAL HIGH (ref 11.4–15.2)

## 2018-08-23 LAB — POC OCCULT BLOOD, ED: Fecal Occult Bld: POSITIVE — AB

## 2018-08-23 LAB — APTT: aPTT: 39 seconds — ABNORMAL HIGH (ref 24–36)

## 2018-08-23 LAB — TYPE AND SCREEN
ABO/RH(D): O POS
Antibody Screen: NEGATIVE

## 2018-08-23 MED ORDER — TIMOLOL HEMIHYDRATE 0.5 % OP SOLN: 1.0000 [drp] | Freq: Every morning | OPHTHALMIC | Status: DC

## 2018-08-23 MED ORDER — AMIODARONE HCL 200 MG PO TABS
200.0000 mg | ORAL_TABLET | Freq: Every day | ORAL | Status: DC
  Administered 2018-08-24 – 2018-08-26 (×2): 200 mg via ORAL
  Filled 2018-08-23 (×3): qty 1

## 2018-08-23 MED ORDER — SODIUM CHLORIDE 0.9 % IV SOLN
80.0000 mg | Freq: Once | INTRAVENOUS | Status: AC
  Administered 2018-08-23: 80 mg via INTRAVENOUS
  Filled 2018-08-23: qty 80

## 2018-08-23 MED ORDER — LATANOPROST 0.005 % OP SOLN
1.0000 [drp] | Freq: Every day | OPHTHALMIC | Status: DC
  Administered 2018-08-23 – 2018-08-26 (×4): 1 [drp] via OPHTHALMIC
  Filled 2018-08-23: qty 2.5

## 2018-08-23 MED ORDER — CALCIUM ACETATE (PHOS BINDER) 667 MG PO CAPS
1334.0000 mg | ORAL_CAPSULE | Freq: Three times a day (TID) | ORAL | Status: DC
  Administered 2018-08-24 – 2018-08-27 (×6): 1334 mg via ORAL
  Filled 2018-08-23 (×12): qty 2

## 2018-08-23 MED ORDER — ONDANSETRON HCL 4 MG PO TABS: 4.0000 mg | ORAL_TABLET | Freq: Four times a day (QID) | ORAL | Status: DC | PRN

## 2018-08-23 MED ORDER — SALINE SPRAY 0.65 % NA SOLN
1.0000 | Freq: Three times a day (TID) | NASAL | Status: DC | PRN
  Administered 2018-08-26: 1 via NASAL
  Filled 2018-08-23: qty 44

## 2018-08-23 MED ORDER — POTASSIUM CHLORIDE CRYS ER 20 MEQ PO TBCR
40.0000 meq | EXTENDED_RELEASE_TABLET | Freq: Once | ORAL | Status: AC
  Administered 2018-08-23: 40 meq via ORAL
  Filled 2018-08-23: qty 2

## 2018-08-23 MED ORDER — ONDANSETRON HCL 4 MG/2ML IJ SOLN: 4.0000 mg | Freq: Four times a day (QID) | INTRAMUSCULAR | Status: DC | PRN

## 2018-08-23 MED ORDER — TIMOLOL MALEATE 0.5 % OP SOLN
1.0000 [drp] | Freq: Every day | OPHTHALMIC | Status: DC
  Administered 2018-08-24 – 2018-08-27 (×3): 1 [drp] via OPHTHALMIC
  Filled 2018-08-23: qty 5

## 2018-08-23 MED ORDER — CEPHALEXIN 250 MG PO CAPS
250.0000 mg | ORAL_CAPSULE | Freq: Every day | ORAL | Status: DC
  Administered 2018-08-24 – 2018-08-27 (×3): 250 mg via ORAL
  Filled 2018-08-23 (×4): qty 1

## 2018-08-23 MED ORDER — SACCHAROMYCES BOULARDII 250 MG PO CAPS
250.0000 mg | ORAL_CAPSULE | Freq: Two times a day (BID) | ORAL | Status: DC
  Administered 2018-08-24 – 2018-08-27 (×6): 250 mg via ORAL
  Filled 2018-08-23 (×6): qty 1

## 2018-08-23 MED ORDER — PANTOPRAZOLE SODIUM 40 MG IV SOLR
40.0000 mg | Freq: Two times a day (BID) | INTRAVENOUS | Status: DC
  Administered 2018-08-24 – 2018-08-26 (×5): 40 mg via INTRAVENOUS
  Filled 2018-08-23 (×5): qty 40

## 2018-08-23 MED ORDER — LOPERAMIDE HCL 2 MG PO CAPS: 4.0000 mg | ORAL_CAPSULE | Freq: Four times a day (QID) | ORAL | Status: DC | PRN

## 2018-08-23 NOTE — ED Provider Notes (Signed)
[facility] EMERGENCY DEPARTMENT Provider Note   CSN: [id number] Arrival date & time: 08/23/18  1538    History   Chief Complaint Chief Complaint  Patient presents with  . Rectal Bleeding    HPI CAILIN GEBEL is a 73 y.o. female.     HPI  73 year old female with history of ESRD on hemodialysis, chronic diastolic CHF, A. fib on Coumadin comes in with chief complaint of vomiting and diarrhea.  Last night patient started having dark and tarry bowel movements.  She has had about 6 episodes of loose bowel movements since it started.  She is also had about 5 episodes of emesis which are brown in color.  She denies seeing any gross blood.  Patient also denies any abdominal pain.  She has no known history of upper GI bleed.  She denies any heavy NSAID use, heavy alcohol use or steroids.  Past Medical History:  Diagnosis Date  . Cardiomyopathy ([place])    a. h/o LV dysfunction EF 20-25% in 2013 due to sepsis.>> improved to normal   . Chronic diastolic CHF (congestive heart failure) ([place])    10/ 2013 in setting of septic shock  . Complication of anesthesia    use a little anesthesia , per patient MD states she quit breathing (2016); hard to wake up  . ESRD (end stage renal disease) ([place])    dialysis Tues, Thurs, Sat [address], sees dr deterding   . GERD (gastroesophageal reflux disease)   . Glaucoma    both eyes  . H/O hiatal hernia    a. CT 2017: large gastric hiatal hernia.  Marland Kitchen Headache(784.0)    migraine hx of  . History of blood transfusion 04/13/2015   . History of echocardiogram    a. Echo 6/17: EF 60-65%, normal wall motion, mild MR, atrial septal lipomatous hypertrophy, PASP 34 mmHg, possible trivial free-flowing pericardial effusion along RV free wall // b. Echo 5/17: Mild LVH, EF 55-60%, normal wall motion, grade 1 diastolic dysfunction, trivial MR, severe LAE, mild RAE, PASP 42 mmHg  . History of nephrostomy 04/11/2015   currently inplace 04/28/2015   removed now  . History of nuclear stress test    a. Myoview 1/14 - Marked ischemia in the basal anterior, mid anterior, apical septal and apical inferior regions, EF 63% >> LHC normal   . Hyperlipidemia   . Hypertension    medication removed from regimen due to low blood pressure   . Iron deficiency    hx  . Nephrolithiasis 2002, 2006   bilateral  . Normal coronary arteries 2014   a. LHC in 1/14: normal coronary arteries  . PAF (paroxysmal atrial fibrillation) ([place])    a. 10/ 2013  in setting of Septic Shock //  b. recurrent during admit for pneumonia, L effusion >> placed on Amiodarone // Coumadin for anticoagulation  . Pneumonia jan 2018, last tme lungs clear now   dx 10-06-2014 per CXR--  on 10-27-2014 pt states finished antibiotic and denies cough or fever  . S/P hemodialysis catheter insertion ([place]) 04/11/2015    right anterior chest , only used once   . Sigmoid diverticulosis   . UTI (urinary tract infection) 05/10/2016    Patient Active Problem List   Diagnosis Date Noted  . CAP (community acquired pneumonia) 07/02/2016  . Dehydration   . PNA (pneumonia) 05/10/2016  . On amiodarone therapy 11/30/2015  . Pleural effusion on left   . Pleurisy   . Hypoxemia   .  HCAP (healthcare-associated pneumonia) 11/21/2015  . S/P thoracentesis   . Chest pain 11/20/2015  . Pleural effusion, left 11/20/2015  . Pain in the chest   . Pericardial effusion   . Pleural effusion   . Encounter for therapeutic drug monitoring 11/13/2015  . Atrial fibrillation with RVR ([place])   . PAF (paroxysmal atrial fibrillation) ([place]) 11/05/2015  . Urinary tract infectious disease 10/31/2015  . Pyelonephritis 09/25/2015  . Sepsis ([place]) 09/25/2015  . Anemia of renal disease 08/14/2015  . Severe protein-calorie malnutrition ([place]) 05/27/2015  . End stage renal disease ([place])   . Normocytic anemia 11/23/2014  . Essential hypertension, benign 08/14/2013  . Glaucoma     Past Surgical History:  Procedure  Laterality Date  . AV FISTULA PLACEMENT Left 06/02/2015   Procedure: BRACHIOCEPHALIC ARTERIOVENOUS (AV) FISTULA CREATION ;  Surgeon: Conrad Wells, MD;  Location: [facility];  Service: Vascular;  Laterality: Left;  . BASCILIC VEIN TRANSPOSITION Left 07/27/2015   Procedure: FIRST STAGE BASILIC VEIN TRANSPOSITION LEFT UPPER ARM;  Surgeon: Conrad Mack, MD;  Location: [facility];  Service: Vascular;  Laterality: Left;  . BASCILIC VEIN TRANSPOSITION Left 09/2015   second phase  . BASCILIC VEIN TRANSPOSITION Left 10/12/2015   Procedure: SECOND STAGE BASILIC VEIN TRANSPOSITION LEFT ARM;  Surgeon: Conrad Penuelas, MD;  Location: [facility];  Service: Vascular;  Laterality: Left;  . BREAST BIOPSY Left 08/23/07   benign fibrocystic with duct ectasia  . CARDIAC CATHETERIZATION  07-11-2012  dr Irish Lack   Abnormal stress test/   normal coronary arteries/  LVEDP  41mmHg  . CARDIOVASCULAR STRESS TEST  06-26-2012  dr Irish Lack   marked ischemia in the basal anterior, mid anterior, apical inferior regions/  normal LVF, ef 63%  . CATARACT EXTRACTION W/ INTRAOCULAR LENS  IMPLANT, BILATERAL    . COLONOSCOPY WITH PROPOFOL N/A 10/17/2016   Procedure: COLONOSCOPY WITH PROPOFOL;  Surgeon: Garlan Fair, MD;  Location: [facility];  Service: Endoscopy;  Laterality: N/A;  . CYSTOSCOPY W/ URETERAL STENT PLACEMENT  04/04/2012   Procedure: CYSTOSCOPY WITH RETROGRADE PYELOGRAM/URETERAL STENT PLACEMENT;  Surgeon: Ailene Rud, MD;  Location: [facility];  Service: Urology;  Laterality: Left;  . CYSTOSCOPY W/ URETERAL STENT PLACEMENT Bilateral 05/04/2015   Procedure: CYSTOSCOPY WITH BILATERAL RETROGRADE PYELOGRAM/ WITH INTERPRETATION, EXCHANGE OF RIGHT URETERAL STENT REPLACEMENT AND PLACEMENT LEFT URETERAL STENT PLACEMENT EXAMINATION OF VAGINA;  Surgeon: Carolan Clines, MD;  Location: [facility];  Service: Urology;  Laterality: Bilateral;  . CYSTOSCOPY WITH STENT PLACEMENT Right 10/28/2014   Procedure: RIGHT URETERAL STENT PLACEMENT;  Surgeon:  Irine Seal, MD;  Location[facility];  Service: Urology;  Laterality: Right;  . CYSTOSCOPY WITH STENT PLACEMENT Right 02/26/2015   Procedure: CYSTOSCOPY RETROGRADE PYELOGRAM WITH STENT PLACEMENT;  Surgeon: Cleon Gustin, MD;  Location: [facility];  Service: Urology;  Laterality: Right;  . CYSTOSCOPY/RETROGRADE/URETEROSCOPY/STONE EXTRACTION WITH BASKET Right 11/21/2014   Procedure: CYSTOSCOPY/RIGHT RETROGRADE PYELOGRAM/RIGHT URETEROSCOPY/BASKET EXTRACTION/RIGHT PYELOSCOPY/LASER OF STONE/RIGHT DOUBLE J STENT;  Surgeon: Carolan Clines, MD;  Location: [facility];  Service: Urology;  Laterality: Right;  . EXTRACORPOREAL SHOCK WAVE LITHOTRIPSY  05-28-2012  &  10-08-2012  . HOLMIUM LASER APPLICATION Right 12/18/6965   Procedure: HOLMIUM LASER APPLICATION;  Surgeon: Carolan Clines, MD;  Location[facility];  Service: Urology;  Laterality: Right;  . IR GENERIC HISTORICAL  02/01/2016   IR NEPHROSTOMY EXCHANGE RIGHT 02/01/2016 Greggory Keen, MD [facility]  . IR GENERIC HISTORICAL  02/24/2016   IR PATIENT  EVAL TECH 0-60 MINS 02/24/2016 Aletta Edouard, MD [facility]  . KNEE ARTHROSCOPY Left 02-14-2003  . LAPAROSCOPIC CHOLECYSTECTOMY  03-23-2005  . TOTAL ABDOMINAL HYSTERECTOMY W/ BILATERAL SALPINGOOPHORECTOMY  1993   secondary to fibroids  . TRANSTHORACIC ECHOCARDIOGRAM  04-09-2012   normal LVF,  ef 60-65%,  mild LAE,  mild TR, trivial MR and PR     OB History    Gravida  1   Para  1   Term  1   Preterm  0   AB  0   Living  1     SAB  0   TAB  0   Ectopic  0   Multiple  0   Live Births  1            Home Medications    Prior to Admission medications   Medication Sig Start Date End Date Taking? Authorizing Provider  acetaminophen (TYLENOL) 500 MG tablet Take 1,000 mg by mouth every 6 (six) hours as needed for moderate pain or headache.     [provider]  amiodarone (PACERONE) 200 MG tablet TAKE ONE TABLET BY MOUTH  DAILY *PLEASE KEEP UPCOMING APPOINTMENT IN NOVEMBER FOR FUTURE REFILLS* 06/18/18   Jettie Booze, MD  bimatoprost (LUMIGAN) 0.01 % SOLN Place 1 drop into both eyes at bedtime.    [provider]  calcium acetate (PHOSLO) 667 MG capsule Take 1,334 mg by mouth 3 (three) times daily with meals.     [provider]  cephALEXin (KEFLEX) 250 MG capsule Take 250 mg by mouth daily.    [provider]  Multiple Vitamins-Minerals (MULTIVITAMIN WITH MINERALS) tablet Take 1 tablet by mouth daily.    [provider]  ranitidine (ZANTAC) 150 MG tablet Take 150 mg by mouth 2 (two) times daily as needed for heartburn.    [provider]  saccharomyces boulardii (FLORASTOR) 250 MG capsule Take 1 capsule (250 mg total) by mouth 2 (two) times daily. 07/08/15   Donne Hazel, MD  sodium chloride (OCEAN) 0.65 % SOLN nasal spray Place 1 spray into both nostrils 3 (three) times daily as needed for congestion.    [provider]  timolol (BETIMOL) 0.5 % ophthalmic solution Place 1 drop into both eyes every morning.     [provider]  warfarin (COUMADIN) 5 MG tablet TAKE AS DIRECTED BY COUMADIN CLINIC 08/16/18   Jettie Booze, MD    Family History Family History  Problem Relation Age of Onset  . Hypertension Mother   . Cancer Mother 69       breast  . Dementia Mother   . Hypertension Brother   . Diabetes Brother   . Heart disease Brother        before age 43  . Cancer Father 52       pancreatic  . Heart failure Paternal Grandmother   . Bladder Cancer Maternal Grandfather     Social History Social History   Tobacco Use  . Smoking status: Never Smoker  . Smokeless tobacco: Never Used  Substance Use Topics  . Alcohol use: No    Alcohol/week: 0.0 standard drinks  . Drug use: No     Allergies   Vicodin [hydrocodone-acetaminophen]; Chlorhexidine; and Percocet [oxycodone-acetaminophen]   Review of Systems Review of Systems    Constitutional: Positive for activity change.  Gastrointestinal: Positive for diarrhea and vomiting. Negative for abdominal pain.  Neurological: Positive for dizziness and weakness.  Hematological: Bruises/bleeds easily.  All  other systems reviewed and are negative.    Physical Exam Updated Vital Signs BP (!) 127/102   Pulse 80   Temp 97.7 F (36.5 C) (Oral)   Resp 18   Ht 5\' 3"  (1.6 m)   Wt 86.2 kg   LMP 01/19/1992 (Approximate)   SpO2 97%   BMI 33.66 kg/m   Physical Exam Vitals signs and nursing note reviewed.  Constitutional:      Appearance: She is well-developed.  HENT:     Head: Normocephalic and atraumatic.  Neck:     Musculoskeletal: Normal range of motion and neck supple.  Cardiovascular:     Rate and Rhythm: Normal rate.  Pulmonary:     Effort: Pulmonary effort is normal.  Abdominal:     General: Bowel sounds are normal.     Comments: Abdominal exam was benign.  Patient does not have significant stool burden in the rectal vault.  I did not appreciate any hemorrhoids nor did we have gross melena, but stools are guaiac positive  Skin:    General: Skin is warm and dry.  Neurological:     Mental Status: She is alert and oriented to person, place, and time.      ED Treatments / Results  Labs (all labs ordered are listed, but only abnormal results are displayed) Labs Reviewed  CBC WITH DIFFERENTIAL/PLATELET  PROTIME-INR  APTT  BASIC METABOLIC PANEL  POC OCCULT BLOOD, ED  TYPE AND SCREEN    EKG None  Radiology No results found.  Procedures Procedures (including critical care time)  Medications Ordered in ED Medications  pantoprazole (PROTONIX) 80 mg in sodium chloride 0.9 % 100 mL IVPB (has no administration in time range)     Initial Impression / Assessment and Plan / ED Course  I have reviewed the triage vital signs and the nursing notes.  Pertinent labs & imaging results that were available during my care of the patient were reviewed  by me and considered in my medical decision making (see chart for details).        73 year old female with history of ESRD on hemodialysis, diastolic CHF, A. fib on Coumadin and diverticulosis comes in with chief complaint of dark tarry stools and coffee ground emesis.  DDx includes: Esophagitis Mallory Weiss tear [place]  Variceal bleeding PUD/Gastritis/ulcers Diverticular bleed Colon cancer Rectal bleed Internal hemorrhoids  Clinical concerns are for likely upper GI source for the bleed.  Appropriate labs have been ordered.  She also has history of C. difficile colitis and is on chronic cephalosporin, however suspicion for this being C. difficile colitis is low right now.  Appropriate labs are ordered and results are pending.  She will need admission to the hospital. Final Clinical Impressions(s) / ED Diagnoses   Final diagnoses:  None    ED Discharge Orders    None       Varney Biles, MD 08/23/18 [phone]

## 2018-08-23 NOTE — ED Notes (Signed)
Dr. Therisa Doyne "[facility]" paged to [phone]-per Dr. Jearld Lesch by Levada Dy

## 2018-08-23 NOTE — Progress Notes (Signed)
Received report from Judson Roch, [place] in ED. Patient transported to room by Judson Roch. Ambulatory to bathroom. [place] to room and vitals collected. Admission completed.

## 2018-08-23 NOTE — ED Notes (Signed)
unsuccessful  Attempt x 2 for iv start ; pt states she usually has to have the IV team come and start a IV

## 2018-08-23 NOTE — H&P (Signed)
History and Physical    MARIAVICTORIA NOTTINGHAM STM:[id number] DOB: 1946/05/18 DOA: 08/23/2018  PCP: Lajean Manes, MD   Patient coming from: Home.  I have personally briefly reviewed patient's old medical records in [place]  Chief Complaint: Diarrhea.  HPI: [name] is a 73 y.o. female with medical history significant of cardiomyopathy, chronic diastolic CHF, ESRD on hemodialysis, GERD, hiatal hernia, glaucoma, history of nephrostomy, hyperlipidemia, hypertension, ESRD anemia, nephrolithiasis, history of pneumonia, history of C. difficile who is brought to the emergency department due to having diarrhea for the past 4 days and melena since yesterday.  She also complains of having some nausea and emesis, but none since yesterday.  She states that she was positive for the flu about a week ago.  She denies fever, chills, further sore throat or rhinorrhea.  Denies dyspnea, wheezing or hemoptysis.  No chest pain, palpitations, dizziness, diaphoresis or pitting edema of the lower extremities.  No dysuria, frequency or hematuria.  Denies polyuria, polydipsia, polyphagia or blurred vision.  ED Course: Initial vital signs temperature 97.7 F, pulse 80, respirations 18, blood pressure 127/102 mmHg and O2 sat 97% on room air.  The patient received a 1000 mL NS bolus, 80 mg of Protonix and 40 mEq of KCl in the emergency department.  She also received "a bag of fluids" during hemodialysis.   White count is 4.4, hemoglobin 11.2 g/dL and platelets 191.PT 25.0, INR 2.3 and APTT 39.  Sodium 135, potassium 2.5, chloride 93 and CO2 27 mmol/L.  Glucose 69, BUN 8, creatinine 2.64 and calcium 7.6 mg/dL. Stool guaiac was positive.  Review of Systems: As per HPI otherwise 10 point review of systems negative.   Past Medical History:  Diagnosis Date  . Cardiomyopathy ([place])    a. h/o LV dysfunction EF 20-25% in 2013 due to sepsis.>> improved to normal   . Chronic diastolic CHF (congestive heart failure) ([place])      10/ 2013 in setting of septic shock  . Complication of anesthesia    use a little anesthesia , per patient MD states she quit breathing (2016); hard to wake up  . ESRD (end stage renal disease) ([place])    dialysis Tues, Thurs, Sat [address], sees dr deterding   . GERD (gastroesophageal reflux disease)   . Glaucoma    both eyes  . H/O hiatal hernia    a. CT 2017: large gastric hiatal hernia.  Marland Kitchen Headache(784.0)    migraine hx of  . History of blood transfusion 04/13/2015   . History of echocardiogram    a. Echo 6/17: EF 60-65%, normal wall motion, mild MR, atrial septal lipomatous hypertrophy, PASP 34 mmHg, possible trivial free-flowing pericardial effusion along RV free wall // b. Echo 5/17: Mild LVH, EF 55-60%, normal wall motion, grade 1 diastolic dysfunction, trivial MR, severe LAE, mild RAE, PASP 42 mmHg  . History of nephrostomy 04/11/2015   currently inplace 04/28/2015  removed now  . History of nuclear stress test    a. Myoview 1/14 - Marked ischemia in the basal anterior, mid anterior, apical septal and apical inferior regions, EF 63% >> LHC normal   . Hyperlipidemia   . Hypertension    medication removed from regimen due to low blood pressure   . Iron deficiency    hx  . Nephrolithiasis 2002, 2006   bilateral  . Normal coronary arteries 2014   a. LHC in 1/14: normal coronary arteries  . PAF (paroxysmal atrial fibrillation) ([place])  a. 10/ 2013  in setting of Septic Shock //  b. recurrent during admit for pneumonia, L effusion >> placed on Amiodarone // Coumadin for anticoagulation  . Pneumonia jan 2018, last tme lungs clear now   dx 10-06-2014 per CXR--  on 10-27-2014 pt states finished antibiotic and denies cough or fever  . S/P hemodialysis catheter insertion ([place]) 04/11/2015    right anterior chest , only used once   . Sigmoid diverticulosis   . UTI (urinary tract infection) 05/10/2016    Past Surgical History:  Procedure Laterality Date  . AV FISTULA PLACEMENT  Left 06/02/2015   Procedure: BRACHIOCEPHALIC ARTERIOVENOUS (AV) FISTULA CREATION ;  Surgeon: Conrad [place], MD;  Location: [place];  Service: Vascular;  Laterality: Left;  . BASCILIC VEIN TRANSPOSITION Left 07/27/2015   Procedure: FIRST STAGE BASILIC VEIN TRANSPOSITION LEFT UPPER ARM;  Surgeon: Conrad Martinsburg, MD;  Location: [place];  Service: Vascular;  Laterality: Left;  . BASCILIC VEIN TRANSPOSITION Left 09/2015   second phase  . BASCILIC VEIN TRANSPOSITION Left 10/12/2015   Procedure: SECOND STAGE BASILIC VEIN TRANSPOSITION LEFT ARM;  Surgeon: Conrad , MD;  Location: [facility];  Service: Vascular;  Laterality: Left;  . BREAST BIOPSY Left 08/23/07   benign fibrocystic with duct ectasia  . CARDIAC CATHETERIZATION  07-11-2012  dr Irish Lack   Abnormal stress test/   normal coronary arteries/  LVEDP  14mmHg  . CARDIOVASCULAR STRESS TEST  06-26-2012  dr Irish Lack   marked ischemia in the basal anterior, mid anterior, apical inferior regions/  normal LVF, ef 63%  . CATARACT EXTRACTION W/ INTRAOCULAR LENS  IMPLANT, BILATERAL    . COLONOSCOPY WITH PROPOFOL N/A 10/17/2016   Procedure: COLONOSCOPY WITH PROPOFOL;  Surgeon: Garlan Fair, MD;  Location: [facility];  Service: Endoscopy;  Laterality: N/A;  . CYSTOSCOPY W/ URETERAL STENT PLACEMENT  04/04/2012   Procedure: CYSTOSCOPY WITH RETROGRADE PYELOGRAM/URETERAL STENT PLACEMENT;  Surgeon: Ailene Rud, MD;  Location: [facility];  Service: Urology;  Laterality: Left;  . CYSTOSCOPY W/ URETERAL STENT PLACEMENT Bilateral 05/04/2015   Procedure: CYSTOSCOPY WITH BILATERAL RETROGRADE PYELOGRAM/ WITH INTERPRETATION, EXCHANGE OF RIGHT URETERAL STENT REPLACEMENT AND PLACEMENT LEFT URETERAL STENT PLACEMENT EXAMINATION OF VAGINA;  Surgeon: Carolan Clines, MD;  Location: [facility];  Service: Urology;  Laterality: Bilateral;  . CYSTOSCOPY WITH STENT PLACEMENT Right 10/28/2014   Procedure: RIGHT URETERAL STENT PLACEMENT;  Surgeon: Irine Seal, MD;  Location[facility];  Service: Urology;  Laterality: Right;  . CYSTOSCOPY WITH STENT PLACEMENT Right 02/26/2015   Procedure: CYSTOSCOPY RETROGRADE PYELOGRAM WITH STENT PLACEMENT;  Surgeon: Cleon Gustin, MD;  Location: [facility];  Service: Urology;  Laterality: Right;  . CYSTOSCOPY/RETROGRADE/URETEROSCOPY/STONE EXTRACTION WITH BASKET Right 11/21/2014   Procedure: CYSTOSCOPY/RIGHT RETROGRADE PYELOGRAM/RIGHT URETEROSCOPY/BASKET EXTRACTION/RIGHT PYELOSCOPY/LASER OF STONE/RIGHT DOUBLE J STENT;  Surgeon: Carolan Clines, MD;  Location: [place];  Service: Urology;  Laterality: Right;  . EXTRACORPOREAL SHOCK WAVE LITHOTRIPSY  05-28-2012  &  10-08-2012  . HOLMIUM LASER APPLICATION Right 4/0/3474   Procedure: HOLMIUM LASER APPLICATION;  Surgeon: Carolan Clines, MD;  Location: [facility];  Service: Urology;  Laterality: Right;  . IR GENERIC HISTORICAL  02/01/2016   IR NEPHROSTOMY EXCHANGE RIGHT 02/01/2016 Greggory Keen, MD [facility]  . IR GENERIC HISTORICAL  02/24/2016   IR PATIENT EVAL TECH 0-60 MINS 02/24/2016 Aletta Edouard, MD [facility]  . KNEE ARTHROSCOPY Left 02-14-2003  . LAPAROSCOPIC CHOLECYSTECTOMY  03-23-2005  . TOTAL ABDOMINAL HYSTERECTOMY W/  BILATERAL SALPINGOOPHORECTOMY  1993   secondary to fibroids  . TRANSTHORACIC ECHOCARDIOGRAM  04-09-2012   normal LVF,  ef 60-65%,  mild LAE,  mild TR, trivial MR and PR     reports that she has never smoked. She has never used smokeless tobacco. She reports that she does not drink alcohol or use drugs.  Allergies  Allergen Reactions  . Vicodin [Hydrocodone-Acetaminophen] Nausea And Vomiting  . Chlorhexidine Rash    Sunburn    rash  . Percocet [Oxycodone-Acetaminophen] Nausea And Vomiting    Family History  Problem Relation Age of Onset  . Hypertension Mother   . Cancer Mother 43       breast  . Dementia Mother   . Hypertension Brother   . Diabetes Brother   . Heart disease Brother        before age 47    . Cancer Father 62       pancreatic  . Heart failure Paternal Grandmother   . Bladder Cancer Maternal Grandfather    Prior to Admission medications   Medication Sig Start Date End Date Taking? Authorizing Provider  acetaminophen (TYLENOL) 500 MG tablet Take 1,000 mg by mouth every 6 (six) hours as needed for moderate pain or headache.    Yes [provider]  amiodarone (PACERONE) 200 MG tablet TAKE ONE TABLET BY MOUTH DAILY *PLEASE KEEP UPCOMING APPOINTMENT IN NOVEMBER FOR FUTURE REFILLS* Patient taking differently: Take 200 mg by mouth daily.  06/18/18  Yes Jettie Booze, MD  bimatoprost (LUMIGAN) 0.01 % SOLN Place 1 drop into both eyes at bedtime.   Yes [provider]  calcium acetate (PHOSLO) 667 MG capsule Take 1,334 mg by mouth 3 (three) times daily with meals.    Yes [provider]  cephALEXin (KEFLEX) 250 MG capsule Take 250 mg by mouth daily.   Yes [provider]  loperamide (IMODIUM A-D) 2 MG tablet Take 4-6 mg by mouth 4 (four) times daily as needed for diarrhea or loose stools.   Yes [provider]  Multiple Vitamins-Minerals (MULTIVITAMIN WITH MINERALS) tablet Take 1 tablet by mouth daily.   Yes [provider]  ondansetron (ZOFRAN) 4 MG tablet Take 4 mg by mouth every 8 (eight) hours as needed for nausea or vomiting.   Yes [provider]  saccharomyces boulardii (FLORASTOR) 250 MG capsule Take 1 capsule (250 mg total) by mouth 2 (two) times daily. 07/08/15  Yes Donne Hazel, MD  sodium chloride (OCEAN) 0.65 % SOLN nasal spray Place 1 spray into both nostrils 3 (three) times daily as needed for congestion.   Yes [provider]  timolol (BETIMOL) 0.5 % ophthalmic solution Place 1 drop into both eyes every morning.    Yes [provider]  warfarin (COUMADIN) 5 MG tablet TAKE AS DIRECTED BY COUMADIN CLINIC Patient taking differently: Take 2.5-5 mg by mouth See admin instructions. Monday,  Wednesday, Friday- takes a whole tablet (5mg ); Tuesday, Thursday, Saturday and Sunday- takes a half tablet (2.5mg ) 08/16/18  Yes Jettie Booze, MD    Physical Exam: Vitals:   08/23/18 1546 08/23/18 1547 08/23/18 1701 08/23/18 1800  BP: (!) 127/102  (!) 111/58 (!) 107/49  Pulse: 80  78 74  Resp: 18  (!) 21 14  Temp: 97.7 F (36.5 C)     TempSrc: Oral     SpO2: 97%  94% 97%  Weight:  86.2 kg    Height:  5\' 3"  (1.6 m)  Constitutional: NAD, calm, comfortable Eyes: PERRL, lids and conjunctivae normal ENMT: Mucous membranes are moist. Posterior pharynx clear of any exudate or lesions. Neck: normal, supple, no masses, no thyromegaly Respiratory: clear to auscultation bilaterally, no wheezing, no crackles. Normal respiratory effort. No accessory muscle use.  Cardiovascular: Regular rate and rhythm, 2/6 SEM, gallops. No extremity edema. 2+ pedal pulses. No carotid bruits.  AV fistula on LUE with good thrill and no erythema. Abdomen: Soft, no tenderness, no masses palpated. No hepatosplenomegaly. Bowel sounds positive.  Musculoskeletal: no clubbing / cyanosis. No joint deformity upper and lower extremities. Good ROM, no contractures. Normal muscle tone.  Skin: Right sided nephrostomy scar. Neurologic: CN 2-12 grossly intact. Sensation intact, DTR normal. Strength 5/5 in all 4.  Psychiatric: Normal judgment and insight. Alert and oriented x 3. Normal mood.   Labs on Admission: I have personally reviewed following labs and imaging studies  CBC: Recent Labs  Lab 08/23/18 1605  WBC 4.4  NEUTROABS 2.9  HGB 11.2*  HCT 34.3*  MCV 94.0  PLT 182   Basic Metabolic Panel: Recent Labs  Lab 08/23/18 1605 08/23/18 1616  NA 135  --   K 2.5*  --   CL 93*  --   CO2 27  --   GLUCOSE 69*  --   BUN 8  --   CREATININE 2.64*  --   CALCIUM 7.6*  --   MG  --  1.9   GFR: Estimated Creatinine Clearance: 19.7 mL/min (A) (by C-G formula based on SCr of 2.64 mg/dL (H)). Liver Function  Tests: No results for input(s): AST, ALT, ALKPHOS, BILITOT, PROT, ALBUMIN in the last 168 hours. No results for input(s): LIPASE, AMYLASE in the last 168 hours. No results for input(s): AMMONIA in the last 168 hours. Coagulation Profile: Recent Labs  Lab 08/23/18 1605  INR 2.3*   Cardiac Enzymes: No results for input(s): CKTOTAL, CKMB, CKMBINDEX, TROPONINI in the last 168 hours. BNP (last 3 results) No results for input(s): PROBNP in the last 8760 hours. HbA1C: No results for input(s): HGBA1C in the last 72 hours. CBG: No results for input(s): GLUCAP in the last 168 hours. Lipid Profile: No results for input(s): CHOL, HDL, LDLCALC, TRIG, CHOLHDL, LDLDIRECT in the last 72 hours. Thyroid Function Tests: No results for input(s): TSH, T4TOTAL, FREET4, T3FREE, THYROIDAB in the last 72 hours. Anemia Panel: No results for input(s): VITAMINB12, FOLATE, FERRITIN, TIBC, IRON, RETICCTPCT in the last 72 hours. Urine analysis:    Component Value Date/Time   COLORURINE RED (A) 11/25/2017 0304   APPEARANCEUR TURBID (A) 11/25/2017 0304   LABSPEC 1.015 11/25/2017 0304   PHURINE 8.0 11/25/2017 0304   GLUCOSEU 250 (A) 11/25/2017 0304   HGBUR LARGE (A) 11/25/2017 0304   BILIRUBINUR SMALL (A) 11/25/2017 0304   BILIRUBINUR neg 09/18/2014 0920   KETONESUR 15 (A) 11/25/2017 0304   PROTEINUR >300 (A) 11/25/2017 0304   UROBILINOGEN 0.2 05/02/2015 2020   NITRITE POSITIVE (A) 11/25/2017 0304   LEUKOCYTESUR LARGE (A) 11/25/2017 0304    Radiological Exams on Admission: No results found.  EKG: Independently reviewed.    Assessment/Plan Principal Problem:   UGI bleed Admit to progressive unit/inpatient. Supplemental oxygen as needed. Defer IV fluids due to history of ESRD. Protonix 40 mg IVP every 12 hours. Monitor hematocrit and hemoglobin. Transfuse as needed. GI to see in a.m.  Active Problems:   Glaucoma Continue timolol drops.    Essential hypertension, benign Not on  antihypertensives. Continue lifestyle modification. Monitor blood pressure.  End stage renal disease ([place]) Next HD should be on Saturday. Will consult nephrology.    Anemia of renal disease Monitor H&H. Epogen per nephrology.    PAF (paroxysmal atrial fibrillation) ([facility]) CHA?DS?-VASc Score of at least 5. Continue amiodarone 200 mg p.o. daily. Hold warfarin    Hypokalemia Replaced. Follow-up potassium level.    Hyperlipidemia Continue lifestyle modifications.    GERD (gastroesophageal reflux disease) Protonix.    Hypertension Not on antihypertensives. Continue lifestyle modifications. Monitor blood pressure.    DVT prophylaxis: SCDs. Code Status: Full code. Family Communication: Disposition Plan: Admit for treatment, H&H monitoring and GI evaluation in a.m. Consults called: GI consulted by ED and will evaluate in a.m. Admission status: Inpatient/progressive unit.   Reubin Milan MD [facility]  08/23/2018, 6:56 PM   This document was prepared using Dragon voice recognition software and may contain some unintended transcription errors.

## 2018-08-23 NOTE — ED Notes (Signed)
ED TO INPATIENT HANDOFF REPORT  ED Nurse Name and Phone #: Judson Roch [phone]  S Name/Age/Gender [name] 73 y.o. female Room/Bed: 024C/024C  Code Status   Code Status: Full Code  Home/SNF/Other Home Patient oriented to: self, place, time and situation Is this baseline? Yes   Triage Complete: Triage complete  Chief Complaint possible gi bleed  Triage Note Pt c/o dark colored diarrhea that began yesterday ; pt denies any abd pain ; pt reports that she has been having diarrhea x 4 days ; pt denies any n/v at this time ; pt teseted + flu x 1week ago ; pt completed Dialysis today , pt on a T,Th,SAT schedule    Allergies Allergies  Allergen Reactions  . Vicodin [Hydrocodone-Acetaminophen] Nausea And Vomiting  . Chlorhexidine Rash    Sunburn    rash  . Percocet [Oxycodone-Acetaminophen] Nausea And Vomiting    Level of Care/Admitting Diagnosis ED Disposition    ED Disposition Condition [facility]: [place] [[id number]]  Level of Care: Progressive [102]  Diagnosis: UGI bleed [267063]  Admitting Physician: Reubin Milan [[id number]]  Attending Physician: Reubin Milan [[id number]]  Estimated length of stay: past midnight tomorrow  Certification:: I certify this patient will need inpatient services for at least 2 midnights  PT Class (Do Not Modify): Inpatient [101]  PT Acc Code (Do Not Modify): Private [1]       B Medical/Surgery History Past Medical History:  Diagnosis Date  . Cardiomyopathy ([place])    a. h/o LV dysfunction EF 20-25% in 2013 due to sepsis.>> improved to normal   . Chronic diastolic CHF (congestive heart failure) ([place])    10/ 2013 in setting of septic shock  . Complication of anesthesia    use a little anesthesia , per patient MD states she quit breathing (2016); hard to wake up  . ESRD (end stage renal disease) ([place])    dialysis Tues, Thurs, Sat [address], sees dr deterding   . GERD (gastroesophageal  reflux disease)   . Glaucoma    both eyes  . H/O hiatal hernia    a. CT 2017: large gastric hiatal hernia.  Marland Kitchen Headache(784.0)    migraine hx of  . History of blood transfusion 04/13/2015   . History of echocardiogram    a. Echo 6/17: EF 60-65%, normal wall motion, mild MR, atrial septal lipomatous hypertrophy, PASP 34 mmHg, possible trivial free-flowing pericardial effusion along RV free wall // b. Echo 5/17: Mild LVH, EF 55-60%, normal wall motion, grade 1 diastolic dysfunction, trivial MR, severe LAE, mild RAE, PASP 42 mmHg  . History of nephrostomy 04/11/2015   currently inplace 04/28/2015  removed now  . History of nuclear stress test    a. Myoview 1/14 - Marked ischemia in the basal anterior, mid anterior, apical septal and apical inferior regions, EF 63% >> LHC normal   . Hyperlipidemia   . Hypertension    medication removed from regimen due to low blood pressure   . Iron deficiency    hx  . Nephrolithiasis 2002, 2006   bilateral  . Normal coronary arteries 2014   a. LHC in 1/14: normal coronary arteries  . PAF (paroxysmal atrial fibrillation) ([place])    a. 10/ 2013  in setting of Septic Shock //  b. recurrent during admit for pneumonia, L effusion >> placed on Amiodarone // Coumadin for anticoagulation  . Pneumonia jan 2018, last tme lungs clear now   dx  10-06-2014 per CXR--  on 10-27-2014 pt states finished antibiotic and denies cough or fever  . S/P hemodialysis catheter insertion ([place]) 04/11/2015    right anterior chest , only used once   . Sigmoid diverticulosis   . UTI (urinary tract infection) 05/10/2016   Past Surgical History:  Procedure Laterality Date  . AV FISTULA PLACEMENT Left 06/02/2015   Procedure: BRACHIOCEPHALIC ARTERIOVENOUS (AV) FISTULA CREATION ;  Surgeon: Conrad Yalobusha, MD;  Location: [facility];  Service: Vascular;  Laterality: Left;  . BASCILIC VEIN TRANSPOSITION Left 07/27/2015   Procedure: FIRST STAGE BASILIC VEIN TRANSPOSITION LEFT UPPER ARM;  Surgeon: Conrad Cordova, MD;  Location: [facility];  Service: Vascular;  Laterality: Left;  . BASCILIC VEIN TRANSPOSITION Left 09/2015   second phase  . BASCILIC VEIN TRANSPOSITION Left 10/12/2015   Procedure: SECOND STAGE BASILIC VEIN TRANSPOSITION LEFT ARM;  Surgeon: Conrad Spring Hill, MD;  Location: [facility];  Service: Vascular;  Laterality: Left;  . BREAST BIOPSY Left 08/23/07   benign fibrocystic with duct ectasia  . CARDIAC CATHETERIZATION  07-11-2012  dr Irish Lack   Abnormal stress test/   normal coronary arteries/  LVEDP  23mmHg  . CARDIOVASCULAR STRESS TEST  06-26-2012  dr Irish Lack   marked ischemia in the basal anterior, mid anterior, apical inferior regions/  normal LVF, ef 63%  . CATARACT EXTRACTION W/ INTRAOCULAR LENS  IMPLANT, BILATERAL    . COLONOSCOPY WITH PROPOFOL N/A 10/17/2016   Procedure: COLONOSCOPY WITH PROPOFOL;  Surgeon: Garlan Fair, MD;  Location: [facility];  Service: Endoscopy;  Laterality: N/A;  . CYSTOSCOPY W/ URETERAL STENT PLACEMENT  04/04/2012   Procedure: CYSTOSCOPY WITH RETROGRADE PYELOGRAM/URETERAL STENT PLACEMENT;  Surgeon: Ailene Rud, MD;  Location: [facility];  Service: Urology;  Laterality: Left;  . CYSTOSCOPY W/ URETERAL STENT PLACEMENT Bilateral 05/04/2015   Procedure: CYSTOSCOPY WITH BILATERAL RETROGRADE PYELOGRAM/ WITH INTERPRETATION, EXCHANGE OF RIGHT URETERAL STENT REPLACEMENT AND PLACEMENT LEFT URETERAL STENT PLACEMENT EXAMINATION OF VAGINA;  Surgeon: Carolan Clines, MD;  Location: [facility];  Service: Urology;  Laterality: Bilateral;  . CYSTOSCOPY WITH STENT PLACEMENT Right 10/28/2014   Procedure: RIGHT URETERAL STENT PLACEMENT;  Surgeon: Irine Seal, MD;  Location: [facility];  Service: Urology;  Laterality: Right;  . CYSTOSCOPY WITH STENT PLACEMENT Right 02/26/2015   Procedure: CYSTOSCOPY RETROGRADE PYELOGRAM WITH STENT PLACEMENT;  Surgeon: Cleon Gustin, MD;  Location: [facility];  Service: Urology;  Laterality: Right;  .  CYSTOSCOPY/RETROGRADE/URETEROSCOPY/STONE EXTRACTION WITH BASKET Right 11/21/2014   Procedure: CYSTOSCOPY/RIGHT RETROGRADE PYELOGRAM/RIGHT URETEROSCOPY/BASKET EXTRACTION/RIGHT PYELOSCOPY/LASER OF STONE/RIGHT DOUBLE J STENT;  Surgeon: Carolan Clines, MD;  Location: [facility];  Service: Urology;  Laterality: Right;  . EXTRACORPOREAL SHOCK WAVE LITHOTRIPSY  05-28-2012  &  10-08-2012  . HOLMIUM LASER APPLICATION Right 08/24/6281   Procedure: HOLMIUM LASER APPLICATION;  Surgeon: Carolan Clines, MD;  Location: [facility];  Service: Urology;  Laterality: Right;  . IR GENERIC HISTORICAL  02/01/2016   IR NEPHROSTOMY EXCHANGE RIGHT 02/01/2016 Greggory Keen, MD [facility]  . IR GENERIC HISTORICAL  02/24/2016   IR PATIENT EVAL TECH 0-60 MINS 02/24/2016 Aletta Edouard, MD [facility]  . KNEE ARTHROSCOPY Left 02-14-2003  . LAPAROSCOPIC CHOLECYSTECTOMY  03-23-2005  . TOTAL ABDOMINAL HYSTERECTOMY W/ BILATERAL SALPINGOOPHORECTOMY  1993   secondary to fibroids  . TRANSTHORACIC ECHOCARDIOGRAM  04-09-2012   normal LVF,  ef 60-65%,  mild LAE,  mild TR, trivial MR and PR     A IV Location/Drains/Wounds Patient Lines/Drains/Airways  Status   Active Line/Drains/Airways    Name:   Placement date:   Placement time:   Site:   Days:   Peripheral IV 08/23/18 Right;Anterior Forearm   08/23/18    1657    Forearm   less than 1   Fistula / Graft Left Upper arm Arteriovenous fistula   06/02/15    0841    Upper arm   1178   Fistula / Graft Left   -    -    -      Fistula / Graft Left Upper arm Arteriovenous fistula   07/27/15    1117    Upper arm   1123   Airway   10/17/16    0741     675   Incision (Closed) 07/27/15 Arm Left   07/27/15    1101     1123   Incision (Closed) 10/12/15 Arm Left   10/12/15    0802     1046          Intake/Output Last 24 hours  Intake/Output Summary (Last 24 hours) at 08/23/2018 2055 Last data filed at 08/23/2018 1728 Gross per 24 hour  Intake 98.37 ml   Output -  Net 98.37 ml    Labs/Imaging Results for orders placed or performed during the hospital encounter of 08/23/18 (from the past 48 hour(s))  CBC WITH DIFFERENTIAL     Status: Abnormal   Collection Time: 08/23/18  4:05 PM  Result Value Ref Range   WBC 4.4 4.0 - 10.5 K/uL   RBC 3.65 (L) 3.87 - 5.11 MIL/uL   Hemoglobin 11.2 (L) 12.0 - 15.0 g/dL   HCT 34.3 (L) 36.0 - 46.0 %   MCV 94.0 80.0 - 100.0 fL   MCH 30.7 26.0 - 34.0 pg   MCHC 32.7 30.0 - 36.0 g/dL   RDW 14.6 11.5 - 15.5 %   Platelets 191 150 - 400 K/uL   nRBC 0.0 0.0 - 0.2 %   Neutrophils Relative % 65 %   Neutro Abs 2.9 1.7 - 7.7 K/uL   Lymphocytes Relative 20 %   Lymphs Abs 0.9 0.7 - 4.0 K/uL   Monocytes Relative 12 %   Monocytes Absolute 0.5 0.1 - 1.0 K/uL   Eosinophils Relative 2 %   Eosinophils Absolute 0.1 0.0 - 0.5 K/uL   Basophils Relative 0 %   Basophils Absolute 0.0 0.0 - 0.1 K/uL   Immature Granulocytes 1 %   Abs Immature Granulocytes 0.02 0.00 - 0.07 K/uL    Comment: Performed at [facility], [address], [place], [place] [address]  Protime-INR     Status: Abnormal   Collection Time: 08/23/18  4:05 PM  Result Value Ref Range   Prothrombin Time 25.0 (H) 11.4 - 15.2 seconds   INR 2.3 (H) 0.8 - 1.2    Comment: (NOTE) INR goal varies based on device and disease states. Performed at [facility], [place] [address] [place], [place] [address]   APTT     Status: Abnormal   Collection Time: 08/23/18  4:05 PM  Result Value Ref Range   aPTT 39 (H) 24 - 36 seconds    Comment:        IF BASELINE aPTT IS ELEVATED, SUGGEST PATIENT RISK ASSESSMENT BE USED TO DETERMINE APPROPRIATE ANTICOAGULANT THERAPY. Performed at [facility], [place] [address], [place], [place] [address]   Basic metabolic panel     Status: Abnormal   Collection  Time: 08/23/18  4:05 PM  Result Value Ref Range   Sodium 135 135 - 145 mmol/L   Potassium 2.5 (LL) 3.5 - 5.1 mmol/L    Comment: CRITICAL RESULT CALLED TO,  READ BACK BY AND VERIFIED WITH: K BROWN,RN 1711 08/23/2018 D BRADLEY    Chloride 93 (L) 98 - 111 mmol/L   CO2 27 22 - 32 mmol/L   Glucose, Bld 69 (L) 70 - 99 mg/dL   BUN 8 8 - 23 mg/dL   Creatinine, Ser 2.64 (H) 0.44 - 1.00 mg/dL   Calcium 7.6 (L) 8.9 - 10.3 mg/dL   GFR calc non Af Amer 17 (L) >60 mL/min   GFR calc Af Amer 20 (L) >60 mL/min   Anion gap 15 5 - 15    Comment: Performed at [facility], [place] [address] [place], [place] [address]  Magnesium     Status: None   Collection Time: 08/23/18  4:16 PM  Result Value Ref Range   Magnesium 1.9 1.7 - 2.4 mg/dL    Comment: Performed at [facility], [place] [address], [place], [place] [address]  POC occult blood, ED     Status: Abnormal   Collection Time: 08/23/18  4:29 PM  Result Value Ref Range   Fecal Occult Bld POSITIVE (A) NEGATIVE  Type and screen [place]     Status: None   Collection Time: 08/23/18  5:01 PM  Result Value Ref Range   ABO/RH(D) O POS    Antibody Screen NEG    Sample Expiration      08/26/2018 Performed at [facility], [place] [address], [place], [place] [address]    No results found.  Pending Labs [employer] (From admission, onward)    Start     Ordered   08/24/18 0400  CBC WITH DIFFERENTIAL  Daily,   R    Question:  Specimen collection method  Answer:  IV Team=IV Team collect   08/23/18 1859   08/23/18 2200  Hemoglobin and hematocrit, blood  Once,   R    Question:  Specimen collection method  Answer:  IV Team=IV Team collect   08/23/18 1852   Signed and Held  Hemoglobin and hematocrit, blood  Now then every 12 hours,   R    Question:  Specimen collection method  Answer:  IV Team=IV Team collect   Signed and Held          Vitals/Pain Today's Vitals   08/23/18 1930 08/23/18 2015 08/23/18 2030 08/23/18 2053  BP: 116/64 97/65 (!) 110/47   Pulse: 73 73 75   Resp: 19 11 15    Temp:      TempSrc:      SpO2: 96% 95% 92%   Weight:      Height:      PainSc:     0-No pain    Isolation Precautions No active isolations  Medications Medications  ondansetron (ZOFRAN) tablet 4 mg (has no administration in time range)    Or  ondansetron (ZOFRAN) injection 4 mg (has no administration in time range)  pantoprazole (PROTONIX) 80 mg in sodium chloride 0.9 % 100 mL IVPB (0 mg Intravenous Stopped 08/23/18 1728)  potassium chloride SA (K-DUR,KLOR-CON) CR tablet 40 mEq (40 mEq Oral Given 08/23/18 1806)    Mobility walks Low fall risk   Focused Assessments gi   R Recommendations: See Admitting Provider Note  Report given to:   Additional Notes:  Restricted on  LUE. A/O x4, ambulatory

## 2018-08-23 NOTE — ED Triage Notes (Addendum)
Pt c/o dark colored diarrhea that began yesterday ; pt denies any abd pain ; pt reports that she has been having diarrhea x 4 days ; pt denies any n/v at this time ; pt teseted + flu x 1week ago ; pt completed Dialysis today , pt on a T,Th,SAT schedule

## 2018-08-24 ENCOUNTER — Encounter (HOSPITAL_COMMUNITY): Payer: Self-pay | Admitting: Internal Medicine

## 2018-08-24 DIAGNOSIS — K921 Melena: Secondary | ICD-10-CM

## 2018-08-24 DIAGNOSIS — I480 Paroxysmal atrial fibrillation: Secondary | ICD-10-CM

## 2018-08-24 DIAGNOSIS — N186 End stage renal disease: Secondary | ICD-10-CM

## 2018-08-24 DIAGNOSIS — E876 Hypokalemia: Secondary | ICD-10-CM

## 2018-08-24 LAB — BASIC METABOLIC PANEL
Anion gap: 8 (ref 5–15)
BUN: 14 mg/dL (ref 8–23)
CO2: 31 mmol/L (ref 22–32)
Calcium: 7.7 mg/dL — ABNORMAL LOW (ref 8.9–10.3)
Chloride: 95 mmol/L — ABNORMAL LOW (ref 98–111)
Creatinine, Ser: 3.83 mg/dL — ABNORMAL HIGH (ref 0.44–1.00)
GFR calc Af Amer: 13 mL/min — ABNORMAL LOW (ref >60)
GFR calc non Af Amer: 11 mL/min — ABNORMAL LOW (ref >60)
Glucose, Bld: 86 mg/dL (ref 70–99)
Potassium: 2.9 mmol/L — ABNORMAL LOW (ref 3.5–5.1)
Sodium: 134 mmol/L — ABNORMAL LOW (ref 135–145)

## 2018-08-24 LAB — CBC WITH DIFFERENTIAL/PLATELET
Abs Immature Granulocytes: 0.04 10*3/uL (ref 0.00–0.07)
Basophils Absolute: 0 10*3/uL (ref 0.0–0.1)
Basophils Relative: 0 %
Eosinophils Absolute: 0.1 10*3/uL (ref 0.0–0.5)
Eosinophils Relative: 1 %
HCT: 31.7 % — ABNORMAL LOW (ref 36.0–46.0)
Hemoglobin: 10.6 g/dL — ABNORMAL LOW (ref 12.0–15.0)
Immature Granulocytes: 1 %
Lymphocytes Relative: 13 %
Lymphs Abs: 0.7 10*3/uL (ref 0.7–4.0)
MCH: 31 pg (ref 26.0–34.0)
MCHC: 33.4 g/dL (ref 30.0–36.0)
MCV: 92.7 fL (ref 80.0–100.0)
Monocytes Absolute: 0.5 10*3/uL (ref 0.1–1.0)
Monocytes Relative: 10 %
Neutro Abs: 4 10*3/uL (ref 1.7–7.7)
Neutrophils Relative %: 75 %
Platelets: 189 10*3/uL (ref 150–400)
RBC: 3.42 MIL/uL — ABNORMAL LOW (ref 3.87–5.11)
RDW: 14.8 % (ref 11.5–15.5)
WBC: 5.4 10*3/uL (ref 4.0–10.5)
nRBC: 0 % (ref 0.0–0.2)

## 2018-08-24 LAB — COMPREHENSIVE METABOLIC PANEL
ALT: 29 U/L (ref 0–44)
AST: 35 U/L (ref 15–41)
Albumin: 2.4 g/dL — ABNORMAL LOW (ref 3.5–5.0)
Alkaline Phosphatase: 54 U/L (ref 38–126)
Anion gap: 11 (ref 5–15)
BUN: 18 mg/dL (ref 8–23)
CO2: 27 mmol/L (ref 22–32)
Calcium: 7.7 mg/dL — ABNORMAL LOW (ref 8.9–10.3)
Chloride: 97 mmol/L — ABNORMAL LOW (ref 98–111)
Creatinine, Ser: 4.52 mg/dL — ABNORMAL HIGH (ref 0.44–1.00)
GFR calc Af Amer: 10 mL/min — ABNORMAL LOW (ref >60)
GFR calc non Af Amer: 9 mL/min — ABNORMAL LOW (ref >60)
Glucose, Bld: 77 mg/dL (ref 70–99)
Potassium: 3.2 mmol/L — ABNORMAL LOW (ref 3.5–5.1)
Sodium: 135 mmol/L (ref 135–145)
Total Bilirubin: 0.9 mg/dL (ref 0.3–1.2)
Total Protein: 4.6 g/dL — ABNORMAL LOW (ref 6.5–8.1)

## 2018-08-24 LAB — PROTIME-INR
INR: 2.8 — ABNORMAL HIGH (ref 0.8–1.2)
Prothrombin Time: 28.9 seconds — ABNORMAL HIGH (ref 11.4–15.2)

## 2018-08-24 LAB — CBC
HCT: 31.8 % — ABNORMAL LOW (ref 36.0–46.0)
Hemoglobin: 10.4 g/dL — ABNORMAL LOW (ref 12.0–15.0)
MCH: 30.1 pg (ref 26.0–34.0)
MCHC: 32.7 g/dL (ref 30.0–36.0)
MCV: 92.2 fL (ref 80.0–100.0)
Platelets: 195 10*3/uL (ref 150–400)
RBC: 3.45 MIL/uL — ABNORMAL LOW (ref 3.87–5.11)
RDW: 14.6 % (ref 11.5–15.5)
WBC: 5.7 10*3/uL (ref 4.0–10.5)
nRBC: 0 % (ref 0.0–0.2)

## 2018-08-24 LAB — HEMOGLOBIN AND HEMATOCRIT, BLOOD
HCT: 30.4 % — ABNORMAL LOW (ref 36.0–46.0)
Hemoglobin: 10 g/dL — ABNORMAL LOW (ref 12.0–15.0)

## 2018-08-24 LAB — MRSA PCR SCREENING: MRSA by PCR: NEGATIVE

## 2018-08-24 MED ORDER — PHYTONADIONE 5 MG PO TABS
5.0000 mg | ORAL_TABLET | Freq: Once | ORAL | Status: AC
  Administered 2018-08-24: 5 mg via ORAL
  Filled 2018-08-24: qty 1

## 2018-08-24 MED ORDER — ACETAMINOPHEN 325 MG PO TABS
650.0000 mg | ORAL_TABLET | ORAL | Status: DC | PRN
  Administered 2018-08-24 – 2018-08-26 (×3): 650 mg via ORAL
  Filled 2018-08-24 (×2): qty 2

## 2018-08-24 MED ORDER — POTASSIUM CHLORIDE CRYS ER 20 MEQ PO TBCR
40.0000 meq | EXTENDED_RELEASE_TABLET | Freq: Once | ORAL | Status: AC
  Administered 2018-08-24: 40 meq via ORAL
  Filled 2018-08-24: qty 2

## 2018-08-24 MED ORDER — CHLORHEXIDINE GLUCONATE CLOTH 2 % EX PADS: 6.0000 | MEDICATED_PAD | Freq: Every day | CUTANEOUS | Status: DC

## 2018-08-24 NOTE — Progress Notes (Addendum)
PROGRESS NOTE  [name] UUV:[id number] DOB: 1945-09-13 DOA: 08/23/2018 PCP: Lajean Manes, MD  HPI/Recap of past 24 hours:  Report diarrhea appear has stopped ,denies ab pain, no fever, hgb stable  Reports daughter had similar symptoms but milder that just lasted for one day  Daughter and pastor at bedside with patient's permission   Assessment/Plan: Principal Problem:   UGI bleed Active Problems:   Glaucoma   Essential hypertension, benign   End stage renal disease (H[facility])   Anemia of renal disease   PAF (paroxysmal atrial fibrillation) ([facility])   Hypokalemia   Hyperlipidemia   GERD (gastroesophageal reflux disease)   Hypertension  Melena/FOBT+ -plan to scope in am if INR less than 2 -hold coumadin, will give vitk -will follow gi recommendation  PAF maintained sinus rhythm on amiodarone, coumadin held due to GI bleed /planned scope    Hypokalemia: Likely due to vomiting and diarrhea Replace k   ESRD on HD TTS oliguria  Plan to dialyze after endoscope tomorrow Case discussed with nephrology Dr Jonnie Finner   h/o bilateral kidney stones, h/o right sided nephrostomy tube in 2017, removed,  H/o pyelonephritis, h/o recurrent uti On chronic abx keflex daily, on probiotics  H/o C diff  Reports diarrhea resolved, last bm was formed, no ab pain, no fever, no leukocytosis  Code Status: full  Family Communication: patient and daughter at bedside  Disposition Plan: home in 1-2 days , pending clinical improvement and GI /nephrology clearance   Consultants:  GI  nephrology  Procedures:  EGD planned on 3/7 if inr is less than 2  Antibiotics:  Chronic keflex daily   Objective: BP (!) 124/58 (BP Location: Right Arm)   Pulse 69   Temp 97.6 F (36.4 C) (Oral)   Resp 14   Ht 5\' 3"  (1.6 m)   Wt 86.2 kg   LMP 01/19/1992 (Approximate)   SpO2 97%   BMI 33.66 kg/m   Intake/Output Summary (Last 24 hours) at 08/24/2018 1153 Last data filed at 08/23/2018  1728 Gross per 24 hour  Intake 98.37 ml  Output -  Net 98.37 ml   Filed Weights   08/23/18 1547  Weight: 86.2 kg    Exam: Patient is examined daily including today on 08/24/2018, exams remain the same as of yesterday except that has changed    General:  NAD  Cardiovascular: RRR  Respiratory: CTABL  Abdomen: Soft/ND/NT, positive BS  Musculoskeletal: No Edema  Neuro: alert, oriented   Data Reviewed: Basic Metabolic Panel: Recent Labs  Lab 08/23/18 1605 08/23/18 1616 08/24/18 0140 08/24/18 0948  NA 135  --  134* 135  K 2.5*  --  2.9* 3.2*  CL 93*  --  95* 97*  CO2 27  --  31 27  GLUCOSE 69*  --  86 77  BUN 8  --  14 18  CREATININE 2.64*  --  3.83* 4.52*  CALCIUM 7.6*  --  7.7* 7.7*  MG  --  1.9  --   --    Liver Function Tests: Recent Labs  Lab 08/24/18 0948  AST 35  ALT 29  ALKPHOS 54  BILITOT 0.9  PROT 4.6*  ALBUMIN 2.4*   No results for input(s): LIPASE, AMYLASE in the last 168 hours. No results for input(s): AMMONIA in the last 168 hours. CBC: Recent Labs  Lab 08/23/18 1605 08/24/18 0147 08/24/18 0948  WBC 4.4 5.7 5.4  NEUTROABS 2.9  --  4.0  HGB 11.2* 10.4* 10.6*  HCT  34.3* 31.8* 31.7*  MCV 94.0 92.2 92.7  PLT 191 195 189   Cardiac Enzymes:   No results for input(s): CKTOTAL, CKMB, CKMBINDEX, TROPONINI in the last 168 hours. BNP (last 3 results) No results for input(s): BNP in the last 8760 hours.  ProBNP (last 3 results) No results for input(s): PROBNP in the last 8760 hours.  CBG: No results for input(s): GLUCAP in the last 168 hours.  No results found for this or any previous visit (from the past 240 hour(s)).   Studies: No results found.  Scheduled Meds: . amiodarone  200 mg Oral Daily  . calcium acetate  1,334 mg Oral TID WC  . cephALEXin  250 mg Oral Daily  . latanoprost  1 drop Both Eyes QHS  . pantoprazole (PROTONIX) IV  40 mg Intravenous Q12H  . phytonadione  5 mg Oral Once  . saccharomyces boulardii  250 mg Oral  BID  . timolol  1 drop Both Eyes Daily    Continuous Infusions:   Time spent: 76mins, case discussed with [facility] I have personally reviewed and interpreted on  08/24/2018 daily labs, tele strips, imagings as discussed above under date review session and assessment and plans.  I reviewed all nursing notes, pharmacy notes, consultant notes,  vitals, pertinent old records  I have discussed plan of care as described above with RN , patient and family on 08/24/2018   Florencia Reasons MD, PhD  [facility] Pager [phone]. If 7PM-7AM, please contact night-coverage at [email or web address], password [facility] 08/24/2018, 11:53 AM  LOS: 1 day

## 2018-08-24 NOTE — Progress Notes (Signed)
Airborne and contact precautions in place. Noted history of positive MRSA PCR. Patient reports positive for the flu last week.

## 2018-08-24 NOTE — Consult Note (Signed)
[facility] Consultation Note  Referring Provider: Dr. Florencia Reasons [facility]) Primary Care Physician:  Lajean Manes, MD  Reason for Consultation:  Dark stools, hemoccult positive stools, anemia  HPI: [name] is a 73 y.o. female admitted for above reasons.  She has many medical problems, including chronic anticoagulation with warfarin.  She was in her static state of GI health until a few days ago; at that time, she developed acute onset nausea, vomiting and diarrhea.  After a couple days, her n/v/d resolved, but then she developed dark stools, which she currently still has.  No abdominal pain, hematochezia, hematemesis.  Had colonoscopy about 2 years ago, routine, which showed polyp.  No prior endoscopy.  No NSAIDs.   Past Medical History:  Diagnosis Date  . Cardiomyopathy ([place])    a. h/o LV dysfunction EF 20-25% in 2013 due to sepsis.>> improved to normal   . Chronic diastolic CHF (congestive heart failure) ([place])    10/ 2013 in setting of septic shock  . Complication of anesthesia    use a little anesthesia , per patient MD states she quit breathing (2016); hard to wake up  . ESRD (end stage renal disease) ([place])    dialysis Tues, Thurs, Sat [address], sees dr deterding   . GERD (gastroesophageal reflux disease)   . Glaucoma    both eyes  . H/O hiatal hernia    a. CT 2017: large gastric hiatal hernia.  Marland Kitchen Headache(784.0)    migraine hx of  . History of blood transfusion 04/13/2015   . History of echocardiogram    a. Echo 6/17: EF 60-65%, normal wall motion, mild MR, atrial septal lipomatous hypertrophy, PASP 34 mmHg, possible trivial free-flowing pericardial effusion along RV free wall // b. Echo 5/17: Mild LVH, EF 55-60%, normal wall motion, grade 1 diastolic dysfunction, trivial MR, severe LAE, mild RAE, PASP 42 mmHg  . History of nephrostomy 04/11/2015   currently inplace 04/28/2015  removed now  . History of nuclear stress test    a. Myoview 1/14 - Marked ischemia in  the basal anterior, mid anterior, apical septal and apical inferior regions, EF 63% >> LHC normal   . Hyperlipidemia   . Hypertension    medication removed from regimen due to low blood pressure   . Iron deficiency    hx  . Nephrolithiasis 2002, 2006   bilateral  . Normal coronary arteries 2014   a. LHC in 1/14: normal coronary arteries  . PAF (paroxysmal atrial fibrillation) ([place])    a. 10/ 2013  in setting of Septic Shock //  b. recurrent during admit for pneumonia, L effusion >> placed on Amiodarone // Coumadin for anticoagulation  . Pneumonia jan 2018, last tme lungs clear now   dx 10-06-2014 per CXR--  on 10-27-2014 pt states finished antibiotic and denies cough or fever  . S/P hemodialysis catheter insertion ([place]) 04/11/2015    right anterior chest , only used once   . Sigmoid diverticulosis   . UTI (urinary tract infection) 05/10/2016    Past Surgical History:  Procedure Laterality Date  . AV FISTULA PLACEMENT Left 06/02/2015   Procedure: BRACHIOCEPHALIC ARTERIOVENOUS (AV) FISTULA CREATION ;  Surgeon: Conrad Woonsocket, MD;  Location: [place];  Service: Vascular;  Laterality: Left;  . BASCILIC VEIN TRANSPOSITION Left 07/27/2015   Procedure: FIRST STAGE BASILIC VEIN TRANSPOSITION LEFT UPPER ARM;  Surgeon: Conrad Oskaloosa, MD;  Location: [place];  Service: Vascular;  Laterality: Left;  . BASCILIC VEIN TRANSPOSITION  Left 09/2015   second phase  . BASCILIC VEIN TRANSPOSITION Left 10/12/2015   Procedure: SECOND STAGE BASILIC VEIN TRANSPOSITION LEFT ARM;  Surgeon: Conrad , MD;  Location: [place];  Service: Vascular;  Laterality: Left;  . BREAST BIOPSY Left 08/23/07   benign fibrocystic with duct ectasia  . CARDIAC CATHETERIZATION  07-11-2012  dr Irish Lack   Abnormal stress test/   normal coronary arteries/  LVEDP  57mmHg  . CARDIOVASCULAR STRESS TEST  06-26-2012  dr Irish Lack   marked ischemia in the basal anterior, mid anterior, apical inferior regions/  normal LVF, ef 63%  . CATARACT EXTRACTION  W/ INTRAOCULAR LENS  IMPLANT, BILATERAL    . COLONOSCOPY WITH PROPOFOL N/A 10/17/2016   Procedure: COLONOSCOPY WITH PROPOFOL;  Surgeon: Garlan Fair, MD;  Location: [facility];  Service: Endoscopy;  Laterality: N/A;  . CYSTOSCOPY W/ URETERAL STENT PLACEMENT  04/04/2012   Procedure: CYSTOSCOPY WITH RETROGRADE PYELOGRAM/URETERAL STENT PLACEMENT;  Surgeon: Ailene Rud, MD;  Location: [place];  Service: Urology;  Laterality: Left;  . CYSTOSCOPY W/ URETERAL STENT PLACEMENT Bilateral 05/04/2015   Procedure: CYSTOSCOPY WITH BILATERAL RETROGRADE PYELOGRAM/ WITH INTERPRETATION, EXCHANGE OF RIGHT URETERAL STENT REPLACEMENT AND PLACEMENT LEFT URETERAL STENT PLACEMENT EXAMINATION OF VAGINA;  Surgeon: Carolan Clines, MD;  Location: [facility];  Service: Urology;  Laterality: Bilateral;  . CYSTOSCOPY WITH STENT PLACEMENT Right 10/28/2014   Procedure: RIGHT URETERAL STENT PLACEMENT;  Surgeon: Irine Seal, MD;  Location[facility];  Service: Urology;  Laterality: Right;  . CYSTOSCOPY WITH STENT PLACEMENT Right 02/26/2015   Procedure: CYSTOSCOPY RETROGRADE PYELOGRAM WITH STENT PLACEMENT;  Surgeon: Cleon Gustin, MD;  Location: [facility];  Service: Urology;  Laterality: Right;  . CYSTOSCOPY/RETROGRADE/URETEROSCOPY/STONE EXTRACTION WITH BASKET Right 11/21/2014   Procedure: CYSTOSCOPY/RIGHT RETROGRADE PYELOGRAM/RIGHT URETEROSCOPY/BASKET EXTRACTION/RIGHT PYELOSCOPY/LASER OF STONE/RIGHT DOUBLE J STENT;  Surgeon: Carolan Clines, MD;  Location: [place];  Service: Urology;  Laterality: Right;  . EXTRACORPOREAL SHOCK WAVE LITHOTRIPSY  05-28-2012  &  10-08-2012  . HOLMIUM LASER APPLICATION Right 02/23/931   Procedure: HOLMIUM LASER APPLICATION;  Surgeon: Carolan Clines, MD;  Location: [facility];  Service: Urology;  Laterality: Right;  . IR GENERIC HISTORICAL  02/01/2016   IR NEPHROSTOMY EXCHANGE RIGHT 02/01/2016 Greggory Keen, MD [facility]  . IR GENERIC  HISTORICAL  02/24/2016   IR PATIENT EVAL TECH 0-60 MINS 02/24/2016 Aletta Edouard, MD [facility]  . KNEE ARTHROSCOPY Left 02-14-2003  . LAPAROSCOPIC CHOLECYSTECTOMY  03-23-2005  . TOTAL ABDOMINAL HYSTERECTOMY W/ BILATERAL SALPINGOOPHORECTOMY  1993   secondary to fibroids  . TRANSTHORACIC ECHOCARDIOGRAM  04-09-2012   normal LVF,  ef 60-65%,  mild LAE,  mild TR, trivial MR and PR    Prior to Admission medications   Medication Sig Start Date End Date Taking? Authorizing Provider  acetaminophen (TYLENOL) 500 MG tablet Take 1,000 mg by mouth every 6 (six) hours as needed for moderate pain or headache.    Yes [provider]  amiodarone (PACERONE) 200 MG tablet TAKE ONE TABLET BY MOUTH DAILY *PLEASE KEEP UPCOMING APPOINTMENT IN NOVEMBER FOR FUTURE REFILLS* Patient taking differently: Take 200 mg by mouth daily.  06/18/18  Yes Jettie Booze, MD  bimatoprost (LUMIGAN) 0.01 % SOLN Place 1 drop into both eyes at bedtime.   Yes [provider]  calcium acetate (PHOSLO) 667 MG capsule Take 1,334 mg by mouth 3 (three) times daily with meals.    Yes [provider]  cephALEXin (KEFLEX) 250 MG  capsule Take 250 mg by mouth daily.   Yes [provider]  loperamide (IMODIUM A-D) 2 MG tablet Take 4-6 mg by mouth 4 (four) times daily as needed for diarrhea or loose stools.   Yes [provider]  Multiple Vitamins-Minerals (MULTIVITAMIN WITH MINERALS) tablet Take 1 tablet by mouth daily.   Yes [provider]  ondansetron (ZOFRAN) 4 MG tablet Take 4 mg by mouth every 8 (eight) hours as needed for nausea or vomiting.   Yes [provider]  saccharomyces boulardii (FLORASTOR) 250 MG capsule Take 1 capsule (250 mg total) by mouth 2 (two) times daily. 07/08/15  Yes Donne Hazel, MD  sodium chloride (OCEAN) 0.65 % SOLN nasal spray Place 1 spray into both nostrils 3 (three) times daily as needed for congestion.   Yes [provider]  timolol  (BETIMOL) 0.5 % ophthalmic solution Place 1 drop into both eyes every morning.    Yes [provider]  warfarin (COUMADIN) 5 MG tablet TAKE AS DIRECTED BY COUMADIN CLINIC Patient taking differently: Take 2.5-5 mg by mouth See admin instructions. Monday, Wednesday, Friday- takes a whole tablet (5mg ); Tuesday, Thursday, Saturday and Sunday- takes a half tablet (2.5mg ) 08/16/18  Yes Jettie Booze, MD    Current Facility-Administered Medications  Medication Dose Route Frequency Provider Last Rate Last Dose  . acetaminophen (TYLENOL) tablet 650 mg  650 mg Oral Q4H PRN Schorr, Rhetta Mura, NP   650 mg at 08/24/18 0021  . amiodarone (PACERONE) tablet 200 mg  200 mg Oral Daily Reubin Milan, MD   200 mg at 08/24/18 1012  . calcium acetate (PHOSLO) capsule 1,334 mg  1,334 mg Oral TID WC Reubin Milan, MD      . cephALEXin [facility]) capsule 250 mg  250 mg Oral Daily Reubin Milan, MD   250 mg at 08/24/18 1014  . latanoprost (XALATAN) 0.005 % ophthalmic solution 1 drop  1 drop Both Eyes QHS Reubin Milan, MD   1 drop at 08/23/18 2308  . loperamide (IMODIUM) capsule 4 mg  4 mg Oral QID PRN Reubin Milan, MD      . ondansetron [facility]) tablet 4 mg  4 mg Oral Q6H PRN Reubin Milan, MD       Or  . ondansetron [facility]) injection 4 mg  4 mg Intravenous Q6H PRN Reubin Milan, MD      . pantoprazole (PROTONIX) injection 40 mg  40 mg Intravenous Q12H Reubin Milan, MD   40 mg at 08/24/18 1017  . saccharomyces boulardii (FLORASTOR) capsule 250 mg  250 mg Oral BID Reubin Milan, MD   250 mg at 08/24/18 1013  . sodium chloride (OCEAN) 0.65 % nasal spray 1 spray  1 spray Each Nare TID PRN Reubin Milan, MD      . timolol (TIMOPTIC) 0.5 % ophthalmic solution 1 drop  1 drop Both Eyes Daily Reubin Milan, MD   1 drop at 08/24/18 1019    Allergies as of 08/23/2018 - Review Complete 08/23/2018  Allergen Reaction Noted  . Vicodin  [hydrocodone-acetaminophen] Nausea And Vomiting 05/23/2012  . Chlorhexidine Rash 06/02/2015  . Percocet [oxycodone-acetaminophen] Nausea And Vomiting 05/22/2012    Family History  Problem Relation Age of Onset  . Hypertension Mother   . Cancer Mother 50       breast  . Dementia Mother   . Hypertension Brother   . Diabetes Brother   . Heart disease Brother  before age 60  . Cancer Father 8       pancreatic  . Heart failure Paternal Grandmother   . Bladder Cancer Maternal Grandfather     Social History   Socioeconomic History  . Marital status: Widowed    Spouse name: Not on file  . Number of children: 1  . Years of education: Not on file  . Highest education level: Not on file  Occupational History  . Occupation: retired  . Occupation: Herbalist  Social Needs  . Financial resource strain: Not on file  . Food insecurity:    Worry: Not on file    Inability: Not on file  . Transportation needs:    Medical: Not on file    Non-medical: Not on file  Tobacco Use  . Smoking status: Never Smoker  . Smokeless tobacco: Never Used  Substance and Sexual Activity  . Alcohol use: No    Alcohol/week: 0.0 standard drinks  . Drug use: No  . Sexual activity: Never    Birth control/protection: Post-menopausal, Surgical    Comment: widow husband passed 5/05 with lung cancer  Lifestyle  . Physical activity:    Days per week: Not on file    Minutes per session: Not on file  . Stress: Not on file  Relationships  . Social connections:    Talks on phone: Not on file    Gets together: Not on file    Attends religious service: Not on file    Active member of club or organization: Not on file    Attends meetings of clubs or organizations: Not on file    Relationship status: Not on file  . Intimate partner violence:    Fear of current or ex partner: Not on file    Emotionally abused: Not on file    Physically abused: Not on file    Forced sexual activity: Not on file   Other Topics Concern  . Not on file  Social History Narrative  . Not on file    Review of Systems: As per HPI, all others negative  Physical Exam: Vital signs in last 24 hours: Temp:  [97.5 F (36.4 C)-97.7 F (36.5 C)] 97.5 F (36.4 C) (03/06 0753) Pulse Rate:  [72-80] 75 (03/06 0451) Resp:  [11-21] 14 (03/06 0451) BP: (97-127)/(44-102) 123/55 (03/06 0753) SpO2:  [92 %-98 %] 96 % (03/06 0753) Weight:  [86.2 kg] 86.2 kg (03/05 1547) Last BM Date: 08/23/18 General:   Alert, overweight, Well-developed, well-nourished, pleasant and cooperative in NAD Head:  Normocephalic and atraumatic. Eyes:  Sclera clear, no icterus.   Conjunctiva pink. Ears:  Normal auditory acuity. Nose:  No deformity, discharge,  or lesions. Mouth:  No deformity or lesions.  Oropharynx pink & moist. Neck:  Supple; no masses or thyromegaly. Lungs:  Clear throughout to auscultation.   No wheezes, crackles, or rhonchi. No acute distress. Heart:  Regular rate and rhythm; IV/VI SEM RUSB Abdomen:  Soft, nontender and nondistended. No masses, hepatosplenomegaly or hernias noted. Normal bowel sounds, without guarding, and without rebound.     Msk:  Symmetrical without gross deformities. Normal posture. Pulses:  Normal pulses noted. Extremities:  + thrill (avf) LUE; Without clubbing or edema. Neurologic:  Alert and  oriented x4;  grossly normal neurologically. Skin:  Scattered ecchymoses, otherwise intact without significant lesions or rashes. Psych:  Alert and cooperative. Normal mood and affect.   Lab Results: Recent Labs    08/23/18 1605 08/24/18 0147 08/24/18 0948  WBC  4.4 5.7 5.4  HGB 11.2* 10.4* 10.6*  HCT 34.3* 31.8* 31.7*  PLT 191 195 189   BMET Recent Labs    08/23/18 1605 08/24/18 0140 08/24/18 0948  NA 135 134* 135  K 2.5* 2.9* 3.2*  CL 93* 95* 97*  CO2 27 31 27   GLUCOSE 69* 86 77  BUN 8 14 18   CREATININE 2.64* 3.83* 4.52*  CALCIUM 7.6* 7.7* 7.7*   LFT Recent Labs     08/24/18 0948  PROT 4.6*  ALBUMIN 2.4*  AST 35  ALT 29  ALKPHOS 54  BILITOT 0.9   PT/INR Recent Labs    08/23/18 1605 08/24/18 0948  LABPROT 25.0* 28.9*  INR 2.3* 2.8*    Studies/Results: No results found.  Impression:  1.  Dark stools.  Suspect from bismuth subsalicylate, but low-grade GI bleed can't be excluded. 2.  Chronic anemia. 3.  Nausea, vomiting; resolved; suspect viral gastroenteritis. 4.  ESRD, CHF, a. Fib,  multiple comorbidities. 5.  Chronic anticoagulation (warfarin).  Plan:  1.  Hold warfarin. 2.  Plan for EGD tomorrow, if INR < 2 and if we can coordinate around her dialysis (which is supposed to be done tomorrow); would consider IV vitamin K (but defer to [facility]). 3.  PPI. 4.  Full liquid diet ok, NPO after midnight. 5.  Risks (bleeding, infection, bowel perforation that could require surgery, sedation-related changes in cardiopulmonary systems), benefits (identification and possible treatment of source of symptoms, exclusion of certain causes of symptoms), and alternatives (watchful waiting, radiographic imaging studies, empiric medical treatment) of upper endoscopy (EGD) were explained to patient/family in detail and patient wishes to proceed. 6.  [facility] will follow.   LOS: 1 day   Marbeth Smedley M  08/24/2018, 11:37 AM  Cell [phone] If no answer or after 5 PM call [phone]

## 2018-08-24 NOTE — Progress Notes (Deleted)
[facility] Renal Consultation Note    Indication for Consultation:  Management of ESRD/hemodialysis; anemia, hypertension/volume and secondary hyperparathyroidism  KPT:[id number], Hal, MD  HPI: [name] is a 73 y.o. female. ESRD on HD TTS at [facility].  Past medical history significant for CM, CHF w/EF 55-60% in 02/2017, A fib on chronic coumadin, HLD, recurrent UTIs, hx nephrolithiasis, glaucoma, GERD and Hx diverticulitis.    Patient presented to the ED yesterday due to vomiting and diarrhea x 4 days.  Reports melena starting yesterday.  Tested flu + last week and has been on Tamiflu.  Reports feeling better today, nausea and vomiting resolved.  Denies CP, SOB, abdominal pain, hematochezia, hematemesis, dizziness, weakness, fever and chills.  Pertinent findings since admission include K 2.5 now improved to 3.2 s/p supplements, and +FOBT.   Of note patient is compliant with prescribed dialysis regimen and has been completing treatments without complications.  Missed dialysis on Tuesday due to illness but made up the treatment on Wed and completed her normal dialysis yesterday.  Since illness began patient has been leaving dialysis under her estimated dry weight.     Past Medical History:  Diagnosis Date  . Cardiomyopathy ([place])    a. h/o LV dysfunction EF 20-25% in 2013 due to sepsis.>> improved to normal   . Chronic diastolic CHF (congestive heart failure) ([place])    10/ 2013 in setting of septic shock  . Complication of anesthesia    use a little anesthesia , per patient MD states she quit breathing (2016); hard to wake up  . ESRD (end stage renal disease) ([place])    dialysis Tues, Thurs, Sat [address], sees dr deterding   . GERD (gastroesophageal reflux disease)   . Glaucoma    both eyes  . H/O hiatal hernia    a. CT 2017: large gastric hiatal hernia.  Marland Kitchen Headache(784.0)    migraine hx of  . History of blood transfusion 04/13/2015   . History of  echocardiogram    a. Echo 6/17: EF 60-65%, normal wall motion, mild MR, atrial septal lipomatous hypertrophy, PASP 34 mmHg, possible trivial free-flowing pericardial effusion along RV free wall // b. Echo 5/17: Mild LVH, EF 55-60%, normal wall motion, grade 1 diastolic dysfunction, trivial MR, severe LAE, mild RAE, PASP 42 mmHg  . History of nephrostomy 04/11/2015   currently inplace 04/28/2015  removed now  . History of nuclear stress test    a. Myoview 1/14 - Marked ischemia in the basal anterior, mid anterior, apical septal and apical inferior regions, EF 63% >> LHC normal   . Hyperlipidemia   . Hypertension    medication removed from regimen due to low blood pressure   . Iron deficiency    hx  . Nephrolithiasis 2002, 2006   bilateral  . Normal coronary arteries 2014   a. LHC in 1/14: normal coronary arteries  . PAF (paroxysmal atrial fibrillation) ([place])    a. 10/ 2013  in setting of Septic Shock //  b. recurrent during admit for pneumonia, L effusion >> placed on Amiodarone // Coumadin for anticoagulation  . Pneumonia jan 2018, last tme lungs clear now   dx 10-06-2014 per CXR--  on 10-27-2014 pt states finished antibiotic and denies cough or fever  . S/P hemodialysis catheter insertion ([place]) 04/11/2015    right anterior chest , only used once   . Sigmoid diverticulosis   . UTI (urinary tract infection) 05/10/2016   Past Surgical History:  Procedure Laterality  Date  . AV FISTULA PLACEMENT Left 06/02/2015   Procedure: BRACHIOCEPHALIC ARTERIOVENOUS (AV) FISTULA CREATION ;  Surgeon: Conrad St. Clair, MD;  Location: [place];  Service: Vascular;  Laterality: Left;  . BASCILIC VEIN TRANSPOSITION Left 07/27/2015   Procedure: FIRST STAGE BASILIC VEIN TRANSPOSITION LEFT UPPER ARM;  Surgeon: Conrad [place], MD;  Location: [place];  Service: Vascular;  Laterality: Left;  . BASCILIC VEIN TRANSPOSITION Left 09/2015   second phase  . BASCILIC VEIN TRANSPOSITION Left 10/12/2015   Procedure: SECOND STAGE  BASILIC VEIN TRANSPOSITION LEFT ARM;  Surgeon: Conrad [place], MD;  Location: [place];  Service: Vascular;  Laterality: Left;  . BREAST BIOPSY Left 08/23/07   benign fibrocystic with duct ectasia  . CARDIAC CATHETERIZATION  07-11-2012  dr Irish Lack   Abnormal stress test/   normal coronary arteries/  LVEDP  5mmHg  . CARDIOVASCULAR STRESS TEST  06-26-2012  dr Irish Lack   marked ischemia in the basal anterior, mid anterior, apical inferior regions/  normal LVF, ef 63%  . CATARACT EXTRACTION W/ INTRAOCULAR LENS  IMPLANT, BILATERAL    . COLONOSCOPY WITH PROPOFOL N/A 10/17/2016   Procedure: COLONOSCOPY WITH PROPOFOL;  Surgeon: Garlan Fair, MD;  Location: [facility];  Service: Endoscopy;  Laterality: N/A;  . CYSTOSCOPY W/ URETERAL STENT PLACEMENT  04/04/2012   Procedure: CYSTOSCOPY WITH RETROGRADE PYELOGRAM/URETERAL STENT PLACEMENT;  Surgeon: Ailene Rud, MD;  Location: [place];  Service: Urology;  Laterality: Left;  . CYSTOSCOPY W/ URETERAL STENT PLACEMENT Bilateral 05/04/2015   Procedure: CYSTOSCOPY WITH BILATERAL RETROGRADE PYELOGRAM/ WITH INTERPRETATION, EXCHANGE OF RIGHT URETERAL STENT REPLACEMENT AND PLACEMENT LEFT URETERAL STENT PLACEMENT EXAMINATION OF VAGINA;  Surgeon: Carolan Clines, MD;  Location: [facility];  Service: Urology;  Laterality: Bilateral;  . CYSTOSCOPY WITH STENT PLACEMENT Right 10/28/2014   Procedure: RIGHT URETERAL STENT PLACEMENT;  Surgeon: Irine Seal, MD;  Location: [facility];  Service: Urology;  Laterality: Right;  . CYSTOSCOPY WITH STENT PLACEMENT Right 02/26/2015   Procedure: CYSTOSCOPY RETROGRADE PYELOGRAM WITH STENT PLACEMENT;  Surgeon: Cleon Gustin, MD;  Location: [facility];  Service: Urology;  Laterality: Right;  . CYSTOSCOPY/RETROGRADE/URETEROSCOPY/STONE EXTRACTION WITH BASKET Right 11/21/2014   Procedure: CYSTOSCOPY/RIGHT RETROGRADE PYELOGRAM/RIGHT URETEROSCOPY/BASKET EXTRACTION/RIGHT PYELOSCOPY/LASER OF STONE/RIGHT DOUBLE J STENT;  Surgeon:  Carolan Clines, MD;  Location: [place];  Service: Urology;  Laterality: Right;  . EXTRACORPOREAL SHOCK WAVE LITHOTRIPSY  05-28-2012  &  10-08-2012  . HOLMIUM LASER APPLICATION Right 08/26/298   Procedure: HOLMIUM LASER APPLICATION;  Surgeon: Carolan Clines, MD;  Location: [facility];  Service: Urology;  Laterality: Right;  . IR GENERIC HISTORICAL  02/01/2016   IR NEPHROSTOMY EXCHANGE RIGHT 02/01/2016 Greggory Keen, MD [facility]  . IR GENERIC HISTORICAL  02/24/2016   IR PATIENT EVAL TECH 0-60 MINS 02/24/2016 Aletta Edouard, MD [facility]  . KNEE ARTHROSCOPY Left 02-14-2003  . LAPAROSCOPIC CHOLECYSTECTOMY  03-23-2005  . TOTAL ABDOMINAL HYSTERECTOMY W/ BILATERAL SALPINGOOPHORECTOMY  1993   secondary to fibroids  . TRANSTHORACIC ECHOCARDIOGRAM  04-09-2012   normal LVF,  ef 60-65%,  mild LAE,  mild TR, trivial MR and PR   Family History  Problem Relation Age of Onset  . Hypertension Mother   . Cancer Mother 88       breast  . Dementia Mother   . Hypertension Brother   . Diabetes Brother   . Heart disease Brother        before age 53  . Cancer Father 42  pancreatic  . Heart failure Paternal Grandmother   . Bladder Cancer Maternal Grandfather    Social History:  reports that she has never smoked. She has never used smokeless tobacco. She reports that she does not drink alcohol or use drugs. Allergies  Allergen Reactions  . Vicodin [Hydrocodone-Acetaminophen] Nausea And Vomiting  . Chlorhexidine Rash    Sunburn    rash  . Percocet [Oxycodone-Acetaminophen] Nausea And Vomiting   Prior to Admission medications   Medication Sig Start Date End Date Taking? Authorizing Provider  acetaminophen (TYLENOL) 500 MG tablet Take 1,000 mg by mouth every 6 (six) hours as needed for moderate pain or headache.    Yes [provider]  amiodarone (PACERONE) 200 MG tablet TAKE ONE TABLET BY MOUTH DAILY *PLEASE KEEP UPCOMING APPOINTMENT IN  NOVEMBER FOR FUTURE REFILLS* Patient taking differently: Take 200 mg by mouth daily.  06/18/18  Yes Jettie Booze, MD  bimatoprost (LUMIGAN) 0.01 % SOLN Place 1 drop into both eyes at bedtime.   Yes [provider]  calcium acetate (PHOSLO) 667 MG capsule Take 1,334 mg by mouth 3 (three) times daily with meals.    Yes [provider]  cephALEXin (KEFLEX) 250 MG capsule Take 250 mg by mouth daily.   Yes [provider]  loperamide (IMODIUM A-D) 2 MG tablet Take 4-6 mg by mouth 4 (four) times daily as needed for diarrhea or loose stools.   Yes [provider]  Multiple Vitamins-Minerals (MULTIVITAMIN WITH MINERALS) tablet Take 1 tablet by mouth daily.   Yes [provider]  ondansetron (ZOFRAN) 4 MG tablet Take 4 mg by mouth every 8 (eight) hours as needed for nausea or vomiting.   Yes [provider]  saccharomyces boulardii (FLORASTOR) 250 MG capsule Take 1 capsule (250 mg total) by mouth 2 (two) times daily. 07/08/15  Yes Donne Hazel, MD  sodium chloride (OCEAN) 0.65 % SOLN nasal spray Place 1 spray into both nostrils 3 (three) times daily as needed for congestion.   Yes [provider]  timolol (BETIMOL) 0.5 % ophthalmic solution Place 1 drop into both eyes every morning.    Yes [provider]  warfarin (COUMADIN) 5 MG tablet TAKE AS DIRECTED BY COUMADIN CLINIC Patient taking differently: Take 2.5-5 mg by mouth See admin instructions. Monday, Wednesday, Friday- takes a whole tablet (5mg ); Tuesday, Thursday, Saturday and Sunday- takes a half tablet (2.5mg ) 08/16/18  Yes Jettie Booze, MD   Current Facility-Administered Medications  Medication Dose Route Frequency Provider Last Rate Last Dose  . acetaminophen (TYLENOL) tablet 650 mg  650 mg Oral Q4H PRN Schorr, Rhetta Mura, NP   650 mg at 08/24/18 0021  . amiodarone (PACERONE) tablet 200 mg  200 mg Oral Daily Reubin Milan, MD   200 mg at 08/24/18 1012  .  calcium acetate (PHOSLO) capsule 1,334 mg  1,334 mg Oral TID WC Reubin Milan, MD      . cephALEXin [facility]) capsule 250 mg  250 mg Oral Daily Reubin Milan, MD   250 mg at 08/24/18 1014  . latanoprost (XALATAN) 0.005 % ophthalmic solution 1 drop  1 drop Both Eyes QHS Reubin Milan, MD   1 drop at 08/23/18 2308  . loperamide (IMODIUM) capsule 4 mg  4 mg Oral QID PRN Reubin Milan, MD      . ondansetron [facility]) tablet 4 mg  4 mg Oral Q6H PRN Reubin Milan, MD  Or  . ondansetron (ZOFRAN) injection 4 mg  4 mg Intravenous Q6H PRN Reubin Milan, MD      . pantoprazole (PROTONIX) injection 40 mg  40 mg Intravenous Q12H Reubin Milan, MD   40 mg at 08/24/18 1017  . phytonadione (VITAMIN K) tablet 5 mg  5 mg Oral Once Florencia Reasons, MD      . potassium chloride SA (K-DUR,KLOR-CON) CR tablet 40 mEq  40 mEq Oral Once Florencia Reasons, MD      . saccharomyces boulardii (FLORASTOR) capsule 250 mg  250 mg Oral BID Reubin Milan, MD   250 mg at 08/24/18 1013  . sodium chloride (OCEAN) 0.65 % nasal spray 1 spray  1 spray Each Nare TID PRN Reubin Milan, MD      . timolol (TIMOPTIC) 0.5 % ophthalmic solution 1 drop  1 drop Both Eyes Daily Reubin Milan, MD   1 drop at 08/24/18 1019   Labs: Basic Metabolic Panel: Recent Labs  Lab 08/23/18 1605 08/24/18 0140 08/24/18 0948  NA 135 134* 135  K 2.5* 2.9* 3.2*  CL 93* 95* 97*  CO2 27 31 27   GLUCOSE 69* 86 77  BUN 8 14 18   CREATININE 2.64* 3.83* 4.52*  CALCIUM 7.6* 7.7* 7.7*   Liver Function Tests: Recent Labs  Lab 08/24/18 0948  AST 35  ALT 29  ALKPHOS 54  BILITOT 0.9  PROT 4.6*  ALBUMIN 2.4*   No results for input(s): LIPASE, AMYLASE in the last 168 hours. No results for input(s): AMMONIA in the last 168 hours. CBC: Recent Labs  Lab 08/23/18 1605 08/24/18 0147 08/24/18 0948  WBC 4.4 5.7 5.4  NEUTROABS 2.9  --  4.0  HGB 11.2* 10.4* 10.6*  HCT 34.3* 31.8* 31.7*  MCV 94.0 92.2 92.7   PLT 191 195 189   Cardiac Enzymes: No results for input(s): CKTOTAL, CKMB, CKMBINDEX, TROPONINI in the last 168 hours. CBG: No results for input(s): GLUCAP in the last 168 hours. Iron Studies: No results for input(s): IRON, TIBC, TRANSFERRIN, FERRITIN in the last 72 hours. Studies/Results: No results found.  ROS: All others negative except those listed in HPI.  Physical Exam: Vitals:   08/24/18 0147 08/24/18 0451 08/24/18 0753 08/24/18 1143  BP: (!) 119/44 (!) 114/46 (!) 123/55 (!) 124/58  Pulse: 72 75  69  Resp: 18 14    Temp: 97.7 F (36.5 C) (!) 97.5 F (36.4 C) (!) 97.5 F (36.4 C) 97.6 F (36.4 C)  TempSrc:  Oral Oral Oral  SpO2: 96% 94% 96% 97%  Weight:      Height:         General: WDWN, NAD, well appearing female, laying in bed Head: NCAT sclera not icteric MMM Neck: Supple. No lymphadenopathy Lungs: CTA bilaterally. No wheeze, rales or rhonchi. Breathing is unlabored. Heart: RRR. +3/9 systolic murmur, no rubs or gallops.  Abdomen: soft, nontender, +BS, no guarding, no rebound tenderness  Lower extremities:no edema, ischemic changes, or open wounds  Neuro: AAOx3. Moves all extremities spontaneously. Psych:  Responds to questions appropriately with a normal affect. Dialysis Access: LU AVF +b/t  Dialysis Orders:  TTS - [facility]  4hrs, BFR 400, DFR 800,  EDW 88kg, 2K/ 2Ca  Access: LU AVF  Heparin 1000 units Calcitriol 0.5 mcg PO qHD  Assessment/Plan: 1.  UGIB in setting of AOCKD- +FOBT, Hgb stable at 10.6. Not on ESA. Follow trends. GI consulting.  2.  ESRD -  On HD TTS.  Plans  for HD tomorrow per regular schedule using 4K bath. K 3.2.  K low on presentation likely due to decrease oral intake and just finishing dialysis prior to labs. K repleated. Follow. 3.  Hypertension/volume  - BP well controlled.  Minimal gains at OP.  Under edw since illness began.  Titrate down volume as tolerated keeping SBP>100.  4.  Secondary Hyperparathyroidism -  Ca and phos at goal.  Continue VDRA and binders.  5.  Nutrition - Renal diet w/fluid restrictions. Alb 2.4, +protein supplements. Renal vitamin. 6.  CM/Hx HFrEF 7. PAF - cont amiodarone, coumadin held, per primary 8. Glaucoma - timolol drops 9. GERD - Protonix   Jen Mow, PA-C [facility] Pager: [phone] 08/24/2018, 1:07 PM   Pt seen, examined and agree w A/P as above.  [facility] 08/24/2018, 3:18 PM

## 2018-08-25 ENCOUNTER — Inpatient Hospital Stay (HOSPITAL_COMMUNITY): Payer: Medicare Other | Admitting: Registered Nurse

## 2018-08-25 ENCOUNTER — Encounter (HOSPITAL_COMMUNITY)
Admission: EM | Disposition: A | Discharge status: Home / Self Care | Payer: Self-pay | Source: Home / Self Care | Attending: Internal Medicine

## 2018-08-25 ENCOUNTER — Encounter (HOSPITAL_COMMUNITY): Payer: Self-pay

## 2018-08-25 DIAGNOSIS — N189 Chronic kidney disease, unspecified: Secondary | ICD-10-CM

## 2018-08-25 DIAGNOSIS — D631 Anemia in chronic kidney disease: Secondary | ICD-10-CM

## 2018-08-25 DIAGNOSIS — K449 Diaphragmatic hernia without obstruction or gangrene: Secondary | ICD-10-CM

## 2018-08-25 HISTORY — PX: ESOPHAGOGASTRODUODENOSCOPY (EGD) WITH PROPOFOL: SHX5813

## 2018-08-25 LAB — COMPREHENSIVE METABOLIC PANEL
ALT: 27 U/L (ref 0–44)
AST: 28 U/L (ref 15–41)
Albumin: 2.6 g/dL — ABNORMAL LOW (ref 3.5–5.0)
Alkaline Phosphatase: 60 U/L (ref 38–126)
Anion gap: 8 (ref 5–15)
BUN: 27 mg/dL — ABNORMAL HIGH (ref 8–23)
CO2: 27 mmol/L (ref 22–32)
Calcium: 8.3 mg/dL — ABNORMAL LOW (ref 8.9–10.3)
Chloride: 100 mmol/L (ref 98–111)
Creatinine, Ser: 5.88 mg/dL — ABNORMAL HIGH (ref 0.44–1.00)
GFR calc Af Amer: 8 mL/min — ABNORMAL LOW (ref >60)
GFR calc non Af Amer: 7 mL/min — ABNORMAL LOW (ref >60)
Glucose, Bld: 88 mg/dL (ref 70–99)
Potassium: 3.3 mmol/L — ABNORMAL LOW (ref 3.5–5.1)
Sodium: 135 mmol/L (ref 135–145)
Total Bilirubin: 0.7 mg/dL (ref 0.3–1.2)
Total Protein: 5.1 g/dL — ABNORMAL LOW (ref 6.5–8.1)

## 2018-08-25 LAB — GASTROINTESTINAL PANEL BY PCR, STOOL (REPLACES STOOL CULTURE)

## 2018-08-25 LAB — CBC WITH DIFFERENTIAL/PLATELET
Abs Immature Granulocytes: 0.05 10*3/uL (ref 0.00–0.07)
Basophils Absolute: 0 10*3/uL (ref 0.0–0.1)
Basophils Relative: 0 %
Eosinophils Absolute: 0.2 10*3/uL (ref 0.0–0.5)
Eosinophils Relative: 3 %
HCT: 30 % — ABNORMAL LOW (ref 36.0–46.0)
Hemoglobin: 9.9 g/dL — ABNORMAL LOW (ref 12.0–15.0)
Immature Granulocytes: 1 %
Lymphocytes Relative: 18 %
Lymphs Abs: 1 10*3/uL (ref 0.7–4.0)
MCH: 30.7 pg (ref 26.0–34.0)
MCHC: 33 g/dL (ref 30.0–36.0)
MCV: 92.9 fL (ref 80.0–100.0)
Monocytes Absolute: 0.6 10*3/uL (ref 0.1–1.0)
Monocytes Relative: 10 %
Neutro Abs: 4 10*3/uL (ref 1.7–7.7)
Neutrophils Relative %: 68 %
Platelets: 188 10*3/uL (ref 150–400)
RBC: 3.23 MIL/uL — ABNORMAL LOW (ref 3.87–5.11)
RDW: 14.6 % (ref 11.5–15.5)
WBC: 5.8 10*3/uL (ref 4.0–10.5)
nRBC: 0 % (ref 0.0–0.2)

## 2018-08-25 LAB — HEMOGLOBIN AND HEMATOCRIT, BLOOD
HCT: 34.7 % — ABNORMAL LOW (ref 36.0–46.0)
Hemoglobin: 11.3 g/dL — ABNORMAL LOW (ref 12.0–15.0)

## 2018-08-25 LAB — PROTIME-INR
INR: 1.3 — ABNORMAL HIGH (ref 0.8–1.2)
Prothrombin Time: 16.4 seconds — ABNORMAL HIGH (ref 11.4–15.2)

## 2018-08-25 SURGERY — ESOPHAGOGASTRODUODENOSCOPY (EGD) WITH PROPOFOL
Anesthesia: Monitor Anesthesia Care | Laterality: Left

## 2018-08-25 MED ORDER — WARFARIN SODIUM 7.5 MG PO TABS
7.5000 mg | ORAL_TABLET | Freq: Once | ORAL | Status: AC
  Administered 2018-08-25: 7.5 mg via ORAL
  Filled 2018-08-25: qty 1

## 2018-08-25 MED ORDER — PROPOFOL 500 MG/50ML IV EMUL
INTRAVENOUS | Status: DC | PRN
  Administered 2018-08-25: 75 ug/kg/min via INTRAVENOUS

## 2018-08-25 MED ORDER — PROPOFOL 10 MG/ML IV BOLUS
INTRAVENOUS | Status: DC | PRN
  Administered 2018-08-25: 30 mg via INTRAVENOUS
  Administered 2018-08-25: 20 mg via INTRAVENOUS

## 2018-08-25 MED ORDER — WARFARIN - PHARMACIST DOSING INPATIENT: Freq: Every day | Status: DC

## 2018-08-25 MED ORDER — SODIUM CHLORIDE 0.9 % IV SOLN
INTRAVENOUS | Status: DC
  Administered 2018-08-25: 10:00:00 via INTRAVENOUS

## 2018-08-25 MED ORDER — LIDOCAINE 2% (20 MG/ML) 5 ML SYRINGE
INTRAMUSCULAR | Status: DC | PRN
  Administered 2018-08-25: 60 mg via INTRAVENOUS

## 2018-08-25 SURGICAL SUPPLY — 15 items

## 2018-08-25 NOTE — Consult Note (Signed)
[facility] Renal Consultation Note    Indication for Consultation:  Management of ESRD/hemodialysis; anemia, hypertension/volume and secondary hyperparathyroidism  YQM:[id number], Hal, MD  HPI: [name] is a 73 y.o. female. ESRD on HD TTS at [facility].  Past medical history significant for CM, CHF w/EF 55-60% in 02/2017, A fib on chronic coumadin, HLD, recurrent UTIs, hx nephrolithiasis, glaucoma, GERD and Hx diverticulitis.    Patient presented to the ED yesterday due to vomiting and diarrhea x 4 days.  Reports melena starting yesterday.  Tested flu + last week and has been on Tamiflu.  Reports feeling better today, nausea and vomiting resolved.  Denies CP, SOB, abdominal pain, hematochezia, hematemesis, dizziness, weakness, fever and chills.  Pertinent findings since admission include K 2.5 now improved to 3.2 s/p supplements, and +FOBT.   Of note patient is compliant with prescribed dialysis regimen and has been completing treatments without complications.  Missed dialysis on Tuesday due to illness but made up the treatment on Wed and completed her normal dialysis yesterday.  Since illness began patient has been leaving dialysis under her estimated dry weight.     Past Medical History:  Diagnosis Date  . Cardiomyopathy ([place])    a. h/o LV dysfunction EF 20-25% in 2013 due to sepsis.>> improved to normal   . Chronic diastolic CHF (congestive heart failure) ([place])    10/ 2013 in setting of septic shock  . Complication of anesthesia    use a little anesthesia , per patient MD states she quit breathing (2016); hard to wake up  . ESRD (end stage renal disease) ([place])    dialysis Tues, Thurs, Sat [address], sees dr deterding   . GERD (gastroesophageal reflux disease)   . Glaucoma    both eyes  . H/O hiatal hernia    a. CT 2017: large gastric hiatal hernia.  Marland Kitchen Headache(784.0)    migraine hx of  . History of blood transfusion 04/13/2015   . History of  echocardiogram    a. Echo 6/17: EF 60-65%, normal wall motion, mild MR, atrial septal lipomatous hypertrophy, PASP 34 mmHg, possible trivial free-flowing pericardial effusion along RV free wall // b. Echo 5/17: Mild LVH, EF 55-60%, normal wall motion, grade 1 diastolic dysfunction, trivial MR, severe LAE, mild RAE, PASP 42 mmHg  . History of nephrostomy 04/11/2015   currently inplace 04/28/2015  removed now  . History of nuclear stress test    a. Myoview 1/14 - Marked ischemia in the basal anterior, mid anterior, apical septal and apical inferior regions, EF 63% >> LHC normal   . Hyperlipidemia   . Hypertension    medication removed from regimen due to low blood pressure   . Iron deficiency    hx  . Nephrolithiasis 2002, 2006   bilateral  . Normal coronary arteries 2014   a. LHC in 1/14: normal coronary arteries  . PAF (paroxysmal atrial fibrillation) ([place])    a. 10/ 2013  in setting of Septic Shock //  b. recurrent during admit for pneumonia, L effusion >> placed on Amiodarone // Coumadin for anticoagulation  . Pneumonia jan 2018, last tme lungs clear now   dx 10-06-2014 per CXR--  on 10-27-2014 pt states finished antibiotic and denies cough or fever  . S/P hemodialysis catheter insertion ([place]) 04/11/2015    right anterior chest , only used once   . Sigmoid diverticulosis   . UTI (urinary tract infection) 05/10/2016   Past Surgical History:  Procedure Laterality  Date  . AV FISTULA PLACEMENT Left 06/02/2015   Procedure: BRACHIOCEPHALIC ARTERIOVENOUS (AV) FISTULA CREATION ;  Surgeon: Conrad Gratz, MD;  Location: [place];  Service: Vascular;  Laterality: Left;  . BASCILIC VEIN TRANSPOSITION Left 07/27/2015   Procedure: FIRST STAGE BASILIC VEIN TRANSPOSITION LEFT UPPER ARM;  Surgeon: Conrad Guayabal, MD;  Location: [place];  Service: Vascular;  Laterality: Left;  . BASCILIC VEIN TRANSPOSITION Left 09/2015   second phase  . BASCILIC VEIN TRANSPOSITION Left 10/12/2015   Procedure: SECOND STAGE  BASILIC VEIN TRANSPOSITION LEFT ARM;  Surgeon: Conrad Simsboro, MD;  Location: [place];  Service: Vascular;  Laterality: Left;  . BREAST BIOPSY Left 08/23/07   benign fibrocystic with duct ectasia  . CARDIAC CATHETERIZATION  07-11-2012  dr Irish Lack   Abnormal stress test/   normal coronary arteries/  LVEDP  69mmHg  . CARDIOVASCULAR STRESS TEST  06-26-2012  dr Irish Lack   marked ischemia in the basal anterior, mid anterior, apical inferior regions/  normal LVF, ef 63%  . CATARACT EXTRACTION W/ INTRAOCULAR LENS  IMPLANT, BILATERAL    . COLONOSCOPY WITH PROPOFOL N/A 10/17/2016   Procedure: COLONOSCOPY WITH PROPOFOL;  Surgeon: Garlan Fair, MD;  [facility];  Service: Endoscopy;  Laterality: N/A;  . CYSTOSCOPY W/ URETERAL STENT PLACEMENT  04/04/2012   Procedure: CYSTOSCOPY WITH RETROGRADE PYELOGRAM/URETERAL STENT PLACEMENT;  Surgeon: Ailene Rud, MD;  Location: [place];  Service: Urology;  Laterality: Left;  . CYSTOSCOPY W/ URETERAL STENT PLACEMENT Bilateral 05/04/2015   Procedure: CYSTOSCOPY WITH BILATERAL RETROGRADE PYELOGRAM/ WITH INTERPRETATION, EXCHANGE OF RIGHT URETERAL STENT REPLACEMENT AND PLACEMENT LEFT URETERAL STENT PLACEMENT EXAMINATION OF VAGINA;  Surgeon: Carolan Clines, MD;  [facility];  Service: Urology;  Laterality: Bilateral;  . CYSTOSCOPY WITH STENT PLACEMENT Right 10/28/2014   Procedure: RIGHT URETERAL STENT PLACEMENT;  Surgeon: Irine Seal, MD;  Location: [facility];  Service: Urology;  Laterality: Right;  . CYSTOSCOPY WITH STENT PLACEMENT Right 02/26/2015   Procedure: CYSTOSCOPY RETROGRADE PYELOGRAM WITH STENT PLACEMENT;  Surgeon: Cleon Gustin, MD;  [facility];  Service: Urology;  Laterality: Right;  . CYSTOSCOPY/RETROGRADE/URETEROSCOPY/STONE EXTRACTION WITH BASKET Right 11/21/2014   Procedure: CYSTOSCOPY/RIGHT RETROGRADE PYELOGRAM/RIGHT URETEROSCOPY/BASKET EXTRACTION/RIGHT PYELOSCOPY/LASER OF STONE/RIGHT DOUBLE J STENT;  Surgeon:  Carolan Clines, MD;  Location: [place];  Service: Urology;  Laterality: Right;  . EXTRACORPOREAL SHOCK WAVE LITHOTRIPSY  05-28-2012  &  10-08-2012  . HOLMIUM LASER APPLICATION Right 07/29/9240   Procedure: HOLMIUM LASER APPLICATION;  Surgeon: Carolan Clines, MD;  Location: [facility];  Service: Urology;  Laterality: Right;  . IR GENERIC HISTORICAL  02/01/2016   IR NEPHROSTOMY EXCHANGE RIGHT 02/01/2016 Greggory Keen, MD [facility]  . IR GENERIC HISTORICAL  02/24/2016   IR PATIENT EVAL TECH 0-60 MINS 02/24/2016 Aletta Edouard, MD [facility]  . KNEE ARTHROSCOPY Left 02-14-2003  . LAPAROSCOPIC CHOLECYSTECTOMY  03-23-2005  . TOTAL ABDOMINAL HYSTERECTOMY W/ BILATERAL SALPINGOOPHORECTOMY  1993   secondary to fibroids  . TRANSTHORACIC ECHOCARDIOGRAM  04-09-2012   normal LVF,  ef 60-65%,  mild LAE,  mild TR, trivial MR and PR   Family History  Problem Relation Age of Onset  . Hypertension Mother   . Cancer Mother 62       breast  . Dementia Mother   . Hypertension Brother   . Diabetes Brother   . Heart disease Brother        before age 46  . Cancer Father 89  pancreatic  . Heart failure Paternal Grandmother   . Bladder Cancer Maternal Grandfather    Social History:  reports that she has never smoked. She has never used smokeless tobacco. She reports that she does not drink alcohol or use drugs. Allergies  Allergen Reactions  . Vicodin [Hydrocodone-Acetaminophen] Nausea And Vomiting  . Chlorhexidine Rash    Sunburn    rash  . Percocet [Oxycodone-Acetaminophen] Nausea And Vomiting   Prior to Admission medications   Medication Sig Start Date End Date Taking? Authorizing Provider  acetaminophen (TYLENOL) 500 MG tablet Take 1,000 mg by mouth every 6 (six) hours as needed for moderate pain or headache.    Yes [provider]  amiodarone (PACERONE) 200 MG tablet TAKE ONE TABLET BY MOUTH DAILY *PLEASE KEEP UPCOMING APPOINTMENT IN  NOVEMBER FOR FUTURE REFILLS* Patient taking differently: Take 200 mg by mouth daily.  06/18/18  Yes Jettie Booze, MD  bimatoprost (LUMIGAN) 0.01 % SOLN Place 1 drop into both eyes at bedtime.   Yes [provider]  calcium acetate (PHOSLO) 667 MG capsule Take 1,334 mg by mouth 3 (three) times daily with meals.    Yes [provider]  cephALEXin (KEFLEX) 250 MG capsule Take 250 mg by mouth daily.   Yes [provider]  loperamide (IMODIUM A-D) 2 MG tablet Take 4-6 mg by mouth 4 (four) times daily as needed for diarrhea or loose stools.   Yes [provider]  Multiple Vitamins-Minerals (MULTIVITAMIN WITH MINERALS) tablet Take 1 tablet by mouth daily.   Yes [provider]  ondansetron (ZOFRAN) 4 MG tablet Take 4 mg by mouth every 8 (eight) hours as needed for nausea or vomiting.   Yes [provider]  saccharomyces boulardii (FLORASTOR) 250 MG capsule Take 1 capsule (250 mg total) by mouth 2 (two) times daily. 07/08/15  Yes Donne Hazel, MD  sodium chloride (OCEAN) 0.65 % SOLN nasal spray Place 1 spray into both nostrils 3 (three) times daily as needed for congestion.   Yes [provider]  timolol (BETIMOL) 0.5 % ophthalmic solution Place 1 drop into both eyes every morning.    Yes [provider]  warfarin (COUMADIN) 5 MG tablet TAKE AS DIRECTED BY COUMADIN CLINIC Patient taking differently: Take 2.5-5 mg by mouth See admin instructions. Monday, Wednesday, Friday- takes a whole tablet (5mg ); Tuesday, Thursday, Saturday and Sunday- takes a half tablet (2.5mg ) 08/16/18  Yes Jettie Booze, MD   Current Facility-Administered Medications  Medication Dose Route Frequency Provider Last Rate Last Dose  . acetaminophen (TYLENOL) tablet 650 mg  650 mg Oral Q4H PRN Schorr, Rhetta Mura, NP   650 mg at 08/24/18 0021  . amiodarone (PACERONE) tablet 200 mg  200 mg Oral Daily Reubin Milan, MD   200 mg at 08/24/18 1012  .  calcium acetate (PHOSLO) capsule 1,334 mg  1,334 mg Oral TID WC Reubin Milan, MD      . cephALEXin [facility]) capsule 250 mg  250 mg Oral Daily Reubin Milan, MD   250 mg at 08/24/18 1014  . latanoprost (XALATAN) 0.005 % ophthalmic solution 1 drop  1 drop Both Eyes QHS Reubin Milan, MD   1 drop at 08/23/18 2308  . loperamide (IMODIUM) capsule 4 mg  4 mg Oral QID PRN Reubin Milan, MD      . ondansetron [facility]) tablet 4 mg  4 mg Oral Q6H PRN Reubin Milan, MD  Or  . ondansetron (ZOFRAN) injection 4 mg  4 mg Intravenous Q6H PRN Reubin Milan, MD      . pantoprazole (PROTONIX) injection 40 mg  40 mg Intravenous Q12H Reubin Milan, MD   40 mg at 08/24/18 1017  . phytonadione (VITAMIN K) tablet 5 mg  5 mg Oral Once Florencia Reasons, MD      . potassium chloride SA (K-DUR,KLOR-CON) CR tablet 40 mEq  40 mEq Oral Once Florencia Reasons, MD      . saccharomyces boulardii (FLORASTOR) capsule 250 mg  250 mg Oral BID Reubin Milan, MD   250 mg at 08/24/18 1013  . sodium chloride (OCEAN) 0.65 % nasal spray 1 spray  1 spray Each Nare TID PRN Reubin Milan, MD      . timolol (TIMOPTIC) 0.5 % ophthalmic solution 1 drop  1 drop Both Eyes Daily Reubin Milan, MD   1 drop at 08/24/18 1019   Labs: Basic Metabolic Panel: Recent Labs  Lab 08/23/18 1605 08/24/18 0140 08/24/18 0948  NA 135 134* 135  K 2.5* 2.9* 3.2*  CL 93* 95* 97*  CO2 27 31 27   GLUCOSE 69* 86 77  BUN 8 14 18   CREATININE 2.64* 3.83* 4.52*  CALCIUM 7.6* 7.7* 7.7*   Liver Function Tests: Recent Labs  Lab 08/24/18 0948  AST 35  ALT 29  ALKPHOS 54  BILITOT 0.9  PROT 4.6*  ALBUMIN 2.4*   No results for input(s): LIPASE, AMYLASE in the last 168 hours. No results for input(s): AMMONIA in the last 168 hours. CBC: Recent Labs  Lab 08/23/18 1605 08/24/18 0147 08/24/18 0948  WBC 4.4 5.7 5.4  NEUTROABS 2.9  --  4.0  HGB 11.2* 10.4* 10.6*  HCT 34.3* 31.8* 31.7*  MCV 94.0 92.2 92.7   PLT 191 195 189   Cardiac Enzymes: No results for input(s): CKTOTAL, CKMB, CKMBINDEX, TROPONINI in the last 168 hours. CBG: No results for input(s): GLUCAP in the last 168 hours. Iron Studies: No results for input(s): IRON, TIBC, TRANSFERRIN, FERRITIN in the last 72 hours. Studies/Results: No results found.  ROS: All others negative except those listed in HPI.  Physical Exam: Vitals:   08/24/18 0147 08/24/18 0451 08/24/18 0753 08/24/18 1143  BP: (!) 119/44 (!) 114/46 (!) 123/55 (!) 124/58  Pulse: 72 75  69  Resp: 18 14    Temp: 97.7 F (36.5 C) (!) 97.5 F (36.4 C) (!) 97.5 F (36.4 C) 97.6 F (36.4 C)  TempSrc:  Oral Oral Oral  SpO2: 96% 94% 96% 97%  Weight:      Height:         General: WDWN, NAD, well appearing female, laying in bed Head: NCAT sclera not icteric MMM Neck: Supple. No lymphadenopathy Lungs: CTA bilaterally. No wheeze, rales or rhonchi. Breathing is unlabored. Heart: RRR. +1/0 systolic murmur, no rubs or gallops.  Abdomen: soft, nontender, +BS, no guarding, no rebound tenderness  Lower extremities:no edema, ischemic changes, or open wounds  Neuro: AAOx3. Moves all extremities spontaneously. Psych:  Responds to questions appropriately with a normal affect. Dialysis Access: LU AVF +b/t  Dialysis Orders:  TTS - [facility]  4hrs, BFR 400, DFR 800,  EDW 88kg, 2K/ 2Ca  Access: LU AVF  Heparin 1000 units Calcitriol 0.5 mcg PO qHD  Assessment/Plan: 1.  UGIB in setting of AOCKD- +FOBT, Hgb stable at 10.6. Not on ESA. Follow trends. GI consulting.  2.  ESRD -  On HD TTS.  Plans  for HD tomorrow per regular schedule using 4K bath. K 3.2.  K low on presentation likely due to decrease oral intake and just finishing dialysis prior to labs. K repleated. Follow. 3.  Hypertension/volume  - BP well controlled.  Minimal gains at OP.  Under edw since illness began.  Titrate down volume as tolerated keeping SBP>100.  4.  Secondary Hyperparathyroidism -  Ca and phos at goal.  Continue VDRA and binders.  5.  Nutrition - Renal diet w/fluid restrictions. Alb 2.4, +protein supplements. Renal vitamin. 6.  CM/Hx HFrEF 7. PAF - cont amiodarone, coumadin held, per primary 8. Glaucoma - timolol drops 9. GERD - Protonix   Jen Mow, PA-C [facility] Pager: [phone] 08/24/2018, 1:07 PM   [facility] Renal Consultation Note    Indication for Consultation:  Management of ESRD/hemodialysis; anemia, hypertension/volume and secondary hyperparathyroidism  KPV:[id number], Hal, MD  HPI: ZULEYKA KLOC is a 73 y.o. female. ESRD on HD TTS at [facility].  Past medical history significant for CM, CHF w/EF 55-60% in 02/2017, A fib on chronic coumadin, HLD, recurrent UTIs, hx nephrolithiasis, glaucoma, GERD and Hx diverticulitis.    Patient presented to the ED yesterday due to vomiting and diarrhea x 4 days.  Reports melena starting yesterday.  Tested flu + last week and has been on Tamiflu.  Reports feeling better today, nausea and vomiting resolved.  Denies CP, SOB, abdominal pain, hematochezia, hematemesis, dizziness, weakness, fever and chills.  Pertinent findings since admission include K 2.5 now improved to 3.2 s/p supplements, and +FOBT.   Of note patient is compliant with prescribed dialysis regimen and has been completing treatments without complications.  Missed dialysis on Tuesday due to illness but made up the treatment on Wed and completed her normal dialysis yesterday.  Since illness began patient has been leaving dialysis under her estimated dry weight.     Past Medical History:  Diagnosis Date  . Cardiomyopathy ([place])    a. h/o LV dysfunction EF 20-25% in 2013 due to sepsis.>> improved to normal   . Chronic diastolic CHF (congestive heart failure) ([place])    10/ 2013 in setting of septic shock  . Complication of anesthesia    use a little anesthesia , per patient MD states she quit breathing (2016); hard to wake up  . ESRD  (end stage renal disease) ([place])    dialysis Tues, Thurs, Sat [address], sees dr deterding   . GERD (gastroesophageal reflux disease)   . Glaucoma    both eyes  . H/O hiatal hernia    a. CT 2017: large gastric hiatal hernia.  Marland Kitchen Headache(784.0)    migraine hx of  . History of blood transfusion 04/13/2015   . History of echocardiogram    a. Echo 6/17: EF 60-65%, normal wall motion, mild MR, atrial septal lipomatous hypertrophy, PASP 34 mmHg, possible trivial free-flowing pericardial effusion along RV free wall // b. Echo 5/17: Mild LVH, EF 55-60%, normal wall motion, grade 1 diastolic dysfunction, trivial MR, severe LAE, mild RAE, PASP 42 mmHg  . History of nephrostomy 04/11/2015   currently inplace 04/28/2015  removed now  . History of nuclear stress test    a. Myoview 1/14 - Marked ischemia in the basal anterior, mid anterior, apical septal and apical inferior regions, EF 63% >> LHC normal   . Hyperlipidemia   . Hypertension    medication removed from regimen due to low blood pressure   . Iron deficiency  hx  . Nephrolithiasis 2002, 2006   bilateral  . Normal coronary arteries 2014   a. LHC in 1/14: normal coronary arteries  . PAF (paroxysmal atrial fibrillation) ([place])    a. 10/ 2013  in setting of Septic Shock //  b. recurrent during admit for pneumonia, L effusion >> placed on Amiodarone // Coumadin for anticoagulation  . Pneumonia jan 2018, last tme lungs clear now   dx 10-06-2014 per CXR--  on 10-27-2014 pt states finished antibiotic and denies cough or fever  . S/P hemodialysis catheter insertion ([place]) 04/11/2015    right anterior chest , only used once   . Sigmoid diverticulosis   . UTI (urinary tract infection) 05/10/2016   Past Surgical History:  Procedure Laterality Date  . AV FISTULA PLACEMENT Left 06/02/2015   Procedure: BRACHIOCEPHALIC ARTERIOVENOUS (AV) FISTULA CREATION ;  Surgeon: Conrad [place], MD;  Location: [place];  Service: Vascular;  Laterality: Left;  .  BASCILIC VEIN TRANSPOSITION Left 07/27/2015   Procedure: FIRST STAGE BASILIC VEIN TRANSPOSITION LEFT UPPER ARM;  Surgeon: Conrad [place], MD;  Location: [place];  Service: Vascular;  Laterality: Left;  . BASCILIC VEIN TRANSPOSITION Left 09/2015   second phase  . BASCILIC VEIN TRANSPOSITION Left 10/12/2015   Procedure: SECOND STAGE BASILIC VEIN TRANSPOSITION LEFT ARM;  Surgeon: Conrad , MD;  Location: [place];  Service: Vascular;  Laterality: Left;  . BREAST BIOPSY Left 08/23/07   benign fibrocystic with duct ectasia  . CARDIAC CATHETERIZATION  07-11-2012  dr Irish Lack   Abnormal stress test/   normal coronary arteries/  LVEDP  24mmHg  . CARDIOVASCULAR STRESS TEST  06-26-2012  dr Irish Lack   marked ischemia in the basal anterior, mid anterior, apical inferior regions/  normal LVF, ef 63%  . CATARACT EXTRACTION W/ INTRAOCULAR LENS  IMPLANT, BILATERAL    . COLONOSCOPY WITH PROPOFOL N/A 10/17/2016   Procedure: COLONOSCOPY WITH PROPOFOL;  Surgeon: Garlan Fair, MD;  [facility];  Service: Endoscopy;  Laterality: N/A;  . CYSTOSCOPY W/ URETERAL STENT PLACEMENT  04/04/2012   Procedure: CYSTOSCOPY WITH RETROGRADE PYELOGRAM/URETERAL STENT PLACEMENT;  Surgeon: Ailene Rud, MD;  Location: [place];  Service: Urology;  Laterality: Left;  . CYSTOSCOPY W/ URETERAL STENT PLACEMENT Bilateral 05/04/2015   Procedure: CYSTOSCOPY WITH BILATERAL RETROGRADE PYELOGRAM/ WITH INTERPRETATION, EXCHANGE OF RIGHT URETERAL STENT REPLACEMENT AND PLACEMENT LEFT URETERAL STENT PLACEMENT EXAMINATION OF VAGINA;  Surgeon: Carolan Clines, MD;  [facility];  Service: Urology;  Laterality: Bilateral;  . CYSTOSCOPY WITH STENT PLACEMENT Right 10/28/2014   Procedure: RIGHT URETERAL STENT PLACEMENT;  Surgeon: Irine Seal, MD;  Location: [facility];  Service: Urology;  Laterality: Right;  . CYSTOSCOPY WITH STENT PLACEMENT Right 02/26/2015   Procedure: CYSTOSCOPY RETROGRADE PYELOGRAM WITH STENT PLACEMENT;   Surgeon: Cleon Gustin, MD;  [facility];  Service: Urology;  Laterality: Right;  . CYSTOSCOPY/RETROGRADE/URETEROSCOPY/STONE EXTRACTION WITH BASKET Right 11/21/2014   Procedure: CYSTOSCOPY/RIGHT RETROGRADE PYELOGRAM/RIGHT URETEROSCOPY/BASKET EXTRACTION/RIGHT PYELOSCOPY/LASER OF STONE/RIGHT DOUBLE J STENT;  Surgeon: Carolan Clines, MD;  Location: [place];  Service: Urology;  Laterality: Right;  . EXTRACORPOREAL SHOCK WAVE LITHOTRIPSY  05-28-2012  &  10-08-2012  . HOLMIUM LASER APPLICATION Right 10/26/5636   Procedure: HOLMIUM LASER APPLICATION;  Surgeon: Carolan Clines, MD;  Location: [facility];  Service: Urology;  Laterality: Right;  . IR GENERIC HISTORICAL  02/01/2016   IR NEPHROSTOMY EXCHANGE RIGHT 02/01/2016 Greggory Keen, MD [facility]  . IR GENERIC HISTORICAL  02/24/2016   IR PATIENT EVAL TECH 0-60 MINS 02/24/2016 Aletta Edouard, MD [facility]  . KNEE ARTHROSCOPY Left 02-14-2003  . LAPAROSCOPIC CHOLECYSTECTOMY  03-23-2005  . TOTAL ABDOMINAL HYSTERECTOMY W/ BILATERAL SALPINGOOPHORECTOMY  1993   secondary to fibroids  . TRANSTHORACIC ECHOCARDIOGRAM  04-09-2012   normal LVF,  ef 60-65%,  mild LAE,  mild TR, trivial MR and PR   Family History  Problem Relation Age of Onset  . Hypertension Mother   . Cancer Mother 59       breast  . Dementia Mother   . Hypertension Brother   . Diabetes Brother   . Heart disease Brother        before age 46  . Cancer Father 12       pancreatic  . Heart failure Paternal Grandmother   . Bladder Cancer Maternal Grandfather    Social History:  reports that she has never smoked. She has never used smokeless tobacco. She reports that she does not drink alcohol or use drugs. Allergies  Allergen Reactions  . Vicodin [Hydrocodone-Acetaminophen] Nausea And Vomiting  . Chlorhexidine Rash    Sunburn    rash  . Percocet [Oxycodone-Acetaminophen] Nausea And Vomiting   Prior to Admission medications    Medication Sig Start Date End Date Taking? Authorizing Provider  acetaminophen (TYLENOL) 500 MG tablet Take 1,000 mg by mouth every 6 (six) hours as needed for moderate pain or headache.    Yes [provider]  amiodarone (PACERONE) 200 MG tablet TAKE ONE TABLET BY MOUTH DAILY *PLEASE KEEP UPCOMING APPOINTMENT IN NOVEMBER FOR FUTURE REFILLS* Patient taking differently: Take 200 mg by mouth daily.  06/18/18  Yes Jettie Booze, MD  bimatoprost (LUMIGAN) 0.01 % SOLN Place 1 drop into both eyes at bedtime.   Yes [provider]  calcium acetate (PHOSLO) 667 MG capsule Take 1,334 mg by mouth 3 (three) times daily with meals.    Yes [provider]  cephALEXin (KEFLEX) 250 MG capsule Take 250 mg by mouth daily.   Yes [provider]  loperamide (IMODIUM A-D) 2 MG tablet Take 4-6 mg by mouth 4 (four) times daily as needed for diarrhea or loose stools.   Yes [provider]  Multiple Vitamins-Minerals (MULTIVITAMIN WITH MINERALS) tablet Take 1 tablet by mouth daily.   Yes [provider]  ondansetron (ZOFRAN) 4 MG tablet Take 4 mg by mouth every 8 (eight) hours as needed for nausea or vomiting.   Yes [provider]  saccharomyces boulardii (FLORASTOR) 250 MG capsule Take 1 capsule (250 mg total) by mouth 2 (two) times daily. 07/08/15  Yes Donne Hazel, MD  sodium chloride (OCEAN) 0.65 % SOLN nasal spray Place 1 spray into both nostrils 3 (three) times daily as needed for congestion.   Yes [provider]  timolol (BETIMOL) 0.5 % ophthalmic solution Place 1 drop into both eyes every morning.    Yes [provider]  warfarin (COUMADIN) 5 MG tablet TAKE AS DIRECTED BY COUMADIN CLINIC Patient taking differently: Take 2.5-5 mg by mouth See admin instructions. Monday, Wednesday, Friday- takes a whole tablet (5mg ); Tuesday, Thursday, Saturday and Sunday- takes a half tablet (2.5mg ) 08/16/18  Yes Jettie Booze, MD    Current Facility-Administered Medications  Medication Dose Route Frequency Provider Last Rate Last Dose  . acetaminophen (TYLENOL) tablet 650 mg  650 mg Oral Q4H PRN Schorr, Rhetta Mura, NP   650 mg at 08/24/18 0021  . amiodarone (PACERONE) tablet 200  mg  200 mg Oral Daily Reubin Milan, MD   200 mg at 08/24/18 1012  . calcium acetate (PHOSLO) capsule 1,334 mg  1,334 mg Oral TID WC Reubin Milan, MD      . cephALEXin [facility]) capsule 250 mg  250 mg Oral Daily Reubin Milan, MD   250 mg at 08/24/18 1014  . latanoprost (XALATAN) 0.005 % ophthalmic solution 1 drop  1 drop Both Eyes QHS Reubin Milan, MD   1 drop at 08/23/18 2308  . loperamide (IMODIUM) capsule 4 mg  4 mg Oral QID PRN Reubin Milan, MD      . ondansetron [facility]) tablet 4 mg  4 mg Oral Q6H PRN Reubin Milan, MD       Or  . ondansetron [facility]) injection 4 mg  4 mg Intravenous Q6H PRN Reubin Milan, MD      . pantoprazole (PROTONIX) injection 40 mg  40 mg Intravenous Q12H Reubin Milan, MD   40 mg at 08/24/18 1017  . phytonadione (VITAMIN K) tablet 5 mg  5 mg Oral Once Florencia Reasons, MD      . potassium chloride SA (K-DUR,KLOR-CON) CR tablet 40 mEq  40 mEq Oral Once Florencia Reasons, MD      . saccharomyces boulardii (FLORASTOR) capsule 250 mg  250 mg Oral BID Reubin Milan, MD   250 mg at 08/24/18 1013  . sodium chloride (OCEAN) 0.65 % nasal spray 1 spray  1 spray Each Nare TID PRN Reubin Milan, MD      . timolol (TIMOPTIC) 0.5 % ophthalmic solution 1 drop  1 drop Both Eyes Daily Reubin Milan, MD   1 drop at 08/24/18 1019   Labs: Basic Metabolic Panel: Recent Labs  Lab 08/23/18 1605 08/24/18 0140 08/24/18 0948  NA 135 134* 135  K 2.5* 2.9* 3.2*  CL 93* 95* 97*  CO2 27 31 27   GLUCOSE 69* 86 77  BUN 8 14 18   CREATININE 2.64* 3.83* 4.52*  CALCIUM 7.6* 7.7* 7.7*   Liver Function Tests: Recent Labs  Lab 08/24/18 0948  AST 35  ALT 29  ALKPHOS 54  BILITOT 0.9   PROT 4.6*  ALBUMIN 2.4*   No results for input(s): LIPASE, AMYLASE in the last 168 hours. No results for input(s): AMMONIA in the last 168 hours. CBC: Recent Labs  Lab 08/23/18 1605 08/24/18 0147 08/24/18 0948  WBC 4.4 5.7 5.4  NEUTROABS 2.9  --  4.0  HGB 11.2* 10.4* 10.6*  HCT 34.3* 31.8* 31.7*  MCV 94.0 92.2 92.7  PLT 191 195 189   Cardiac Enzymes: No results for input(s): CKTOTAL, CKMB, CKMBINDEX, TROPONINI in the last 168 hours. CBG: No results for input(s): GLUCAP in the last 168 hours. Iron Studies: No results for input(s): IRON, TIBC, TRANSFERRIN, FERRITIN in the last 72 hours. Studies/Results: No results found.  ROS: All others negative except those listed in HPI.  Physical Exam: Vitals:   08/24/18 0147 08/24/18 0451 08/24/18 0753 08/24/18 1143  BP: (!) 119/44 (!) 114/46 (!) 123/55 (!) 124/58  Pulse: 72 75  69  Resp: 18 14    Temp: 97.7 F (36.5 C) (!) 97.5 F (36.4 C) (!) 97.5 F (36.4 C) 97.6 F (36.4 C)  TempSrc:  Oral Oral Oral  SpO2: 96% 94% 96% 97%  Weight:      Height:         General: WDWN, NAD, well appearing female, laying in bed Head:  NCAT sclera not icteric MMM Neck: Supple. No lymphadenopathy Lungs: CTA bilaterally. No wheeze, rales or rhonchi. Breathing is unlabored. Heart: RRR. +3/1 systolic murmur, no rubs or gallops.  Abdomen: soft, nontender, +BS, no guarding, no rebound tenderness  Lower extremities:no edema, ischemic changes, or open wounds  Neuro: AAOx3. Moves all extremities spontaneously. Psych:  Responds to questions appropriately with a normal affect. Dialysis Access: LU AVF +b/t  Dialysis Orders:  TTS - [facility]  4hrs, BFR 400, DFR 800,  EDW 88kg, 2K/ 2Ca  Access: LU AVF  Heparin 1000 units Calcitriol 0.5 mcg PO qHD  Assessment/Plan: 10.  UGIB in setting of AOCKD- +FOBT, Hgb stable at 10.6. Not on ESA. Follow trends. GI consulting.  11.  ESRD -  On HD TTS.  Plans for HD tomorrow per regular schedule using 4K bath. K 3.2.   K low on presentation likely due to decrease oral intake and just finishing dialysis prior to labs. K repleated. Follow. 12.  Hypertension/volume  - BP well controlled.  Minimal gains at OP.  Under edw since illness began.  Titrate down volume as tolerated keeping SBP>100.  13.  Secondary Hyperparathyroidism -  Ca and phos at goal. Continue VDRA and binders.  14.  Nutrition - Renal diet w/fluid restrictions. Alb 2.4, +protein supplements. Renal vitamin. 15.  CM/Hx HFrEF 16. PAF - cont amiodarone, coumadin held, per primary 17. Glaucoma - timolol drops 18. GERD - Protonix   Jen Mow, PA-C [facility] Pager: [phone] 08/24/2018, 1:07 PM   Pt seen, examined and agree w A/P as above.  [facility] 08/24/2018, 3:18 PM

## 2018-08-25 NOTE — Transfer of Care (Signed)
Immediate Anesthesia Transfer of Care Note  Patient: [name]  Procedure(s) Performed: ESOPHAGOGASTRODUODENOSCOPY (EGD) WITH PROPOFOL (Left )  Patient Location: PACU and Endoscopy Unit  Anesthesia Type:MAC  Level of Consciousness: awake, alert  and oriented  Airway & Oxygen Therapy: Patient Spontanous Breathing  Post-op Assessment: Report given to RN and Post -op Vital signs reviewed and stable  Post vital signs: Reviewed and stable  Last Vitals:  Vitals Value Taken Time  BP    Temp    Pulse    Resp 15 08/25/2018 10:28 AM  SpO2    Vitals shown include unvalidated device data.  Last Pain:  Vitals:   08/25/18 0927  TempSrc: Axillary  PainSc: 0-No pain         Complications: No apparent anesthesia complications

## 2018-08-25 NOTE — Anesthesia Procedure Notes (Signed)
Date/Time: 08/25/2018 10:05 AM Performed by: Trinna Post., CRNA Pre-anesthesia Checklist: Patient identified, Emergency Drugs available, Suction available, Patient being monitored and Timeout performed Patient Re-evaluated:Patient Re-evaluated prior to induction Oxygen Delivery Method: Nasal cannula Preoxygenation: Pre-oxygenation with 100% oxygen Induction Type: IV induction Placement Confirmation: positive ETCO2

## 2018-08-25 NOTE — Progress Notes (Signed)
PROGRESS NOTE  [name] JJO:[id number] DOB: Apr 21, 1946 DOA: 08/23/2018 PCP: Lajean Manes, MD  HPI/Recap of past 24 hours:  She is returned from EGD, no bm today, no n/v denies ab pain, no fever, hgb stable She is to have dialysis today  Reports daughter had similar symptoms but milder that just lasted for one day   Assessment/Plan: Principal Problem:   UGI bleed Active Problems:   Glaucoma   Essential hypertension, benign   End stage renal disease (HCC)   Anemia of renal disease   PAF (paroxysmal atrial fibrillation) (HCC)   Hypokalemia   Hyperlipidemia   GERD (gastroesophageal reflux disease)   Hypertension   Melena  Melena/FOBT+ --hold coumadin held since admission, she received vitkx1 on 3/6 -s/p EGD, +large hiatal hernia, no overt bleed, GI oked to restart coumadin  PAF maintained sinus rhythm on amiodarone,  coumadin held since admission, resumed on 3/7 after EGD per gi recommendation    Hypokalemia: Likely due to vomiting and diarrhea Replace k   ESRD on HD TTS oliguria  Plan per nephrology    h/o bilateral kidney stones, h/o right sided nephrostomy tube in 2017, removed,  H/o pyelonephritis, h/o recurrent uti On chronic abx keflex daily, on probiotics  H/o C diff  Reports diarrhea resolved, last bm was formed, no ab pain, no fever, no leukocytosis  Code Status: full  Family Communication: patient and daughter at bedside on 3/6  Disposition Plan: home in am   Consultants:  GI  nephrology  Procedures:  EGD on 3/7   Dialysis   Antibiotics:  Chronic keflex daily   Objective: BP (!) 130/51 (BP Location: Right Arm)   Pulse 64   Temp (!) 97.2 F (36.2 C) (Axillary)   Resp 12   Ht 5\' 3"  (1.6 m)   Wt 86.2 kg   LMP 01/19/1992 (Approximate)   SpO2 98%   BMI 33.66 kg/m   Intake/Output Summary (Last 24 hours) at 08/25/2018 1247 Last data filed at 08/25/2018 1016 Gross per 24 hour  Intake 100 ml  Output 0 ml  Net 100 ml    Filed Weights   08/23/18 1547 08/25/18 0927  Weight: 86.2 kg 86.2 kg    Exam: Patient is examined daily including today on 08/25/2018, exams remain the same as of yesterday except that has changed    General:  NAD  Cardiovascular: RRR  Respiratory: CTABL  Abdomen: Soft/ND/NT, positive BS  Musculoskeletal: No Edema  Neuro: alert, oriented   Data Reviewed: Basic Metabolic Panel: Recent Labs  Lab 08/23/18 1605 08/23/18 1616 08/24/18 0140 08/24/18 0948 08/25/18 0440  NA 135  --  134* 135 135  K 2.5*  --  2.9* 3.2* 3.3*  CL 93*  --  95* 97* 100  CO2 27  --  31 27 27   GLUCOSE 69*  --  86 77 88  BUN 8  --  14 18 27*  CREATININE 2.64*  --  3.83* 4.52* 5.88*  CALCIUM 7.6*  --  7.7* 7.7* 8.3*  MG  --  1.9  --   --   --    Liver Function Tests: Recent Labs  Lab 08/24/18 0948 08/25/18 0440  AST 35 28  ALT 29 27  ALKPHOS 54 60  BILITOT 0.9 0.7  PROT 4.6* 5.1*  ALBUMIN 2.4* 2.6*   No results for input(s): LIPASE, AMYLASE in the last 168 hours. No results for input(s): AMMONIA in the last 168 hours. CBC: Recent Labs  Lab 08/23/18 1605  08/24/18 0147 08/24/18 0948 08/24/18 2257 08/25/18 0440 08/25/18 1131  WBC 4.4 5.7 5.4  --  5.8  --   NEUTROABS 2.9  --  4.0  --  4.0  --   HGB 11.2* 10.4* 10.6* 10.0* 9.9* 11.3*  HCT 34.3* 31.8* 31.7* 30.4* 30.0* 34.7*  MCV 94.0 92.2 92.7  --  92.9  --   PLT 191 195 189  --  188  --    Cardiac Enzymes:   No results for input(s): CKTOTAL, CKMB, CKMBINDEX, TROPONINI in the last 168 hours. BNP (last 3 results) No results for input(s): BNP in the last 8760 hours.  ProBNP (last 3 results) No results for input(s): PROBNP in the last 8760 hours.  CBG: No results for input(s): GLUCAP in the last 168 hours.  Recent Results (from the past 240 hour(s))  MRSA PCR Screening     Status: None   Collection Time: 08/24/18 10:11 AM  Result Value Ref Range Status   MRSA by PCR NEGATIVE NEGATIVE Final    Comment:        The  GeneXpert MRSA Assay (FDA approved for NASAL specimens only), is one component of a comprehensive MRSA colonization surveillance program. It is not intended to diagnose MRSA infection nor to guide or monitor treatment for MRSA infections. Performed at [facility], [place] [address] [place], [place] [address]      Studies: No results found.  Scheduled Meds: . amiodarone  200 mg Oral Daily  . calcium acetate  1,334 mg Oral TID WC  . cephALEXin  250 mg Oral Daily  . Chlorhexidine Gluconate Cloth  6 each Topical Q0600  . latanoprost  1 drop Both Eyes QHS  . pantoprazole (PROTONIX) IV  40 mg Intravenous Q12H  . saccharomyces boulardii  250 mg Oral BID  . timolol  1 drop Both Eyes Daily    Continuous Infusions:   Time spent: 14mins,  I have personally reviewed and interpreted on  08/25/2018 daily labs,  imagings as discussed above under date review session and assessment and plans.  I reviewed all nursing notes, pharmacy notes, consultant notes,  vitals, pertinent old records  I have discussed plan of care as described above with RN , patient on 08/25/2018   Florencia Reasons MD, PhD  [facility] Pager [phone]. If 7PM-7AM, please contact night-coverage at [email or web address], password [facility] 08/25/2018, 12:47 PM  LOS: 2 days

## 2018-08-25 NOTE — Progress Notes (Signed)
[name]  Subjective: doing well, had EGD this am w/o any bleeding / ulcers, etc no c/o today.   Vitals:   08/25/18 0927 08/25/18 1029 08/25/18 1034 08/25/18 1147  BP: (!) 138/49 96/67 (!) 108/44 (!) 130/51  Pulse: 72 68 66 64  Resp: 11 15 14 12   Temp: 97.9 F (36.6 C) 97.9 F (36.6 C)  (!) 97.2 F (36.2 C)  TempSrc: Axillary Oral  Axillary  SpO2: 98% 97% 98% 98%  Weight: 86.2 kg     Height: 5\' 3"  (1.6 m)       Inpatient medications: . amiodarone  200 mg Oral Daily  . calcium acetate  1,334 mg Oral TID WC  . cephALEXin  250 mg Oral Daily  . Chlorhexidine Gluconate Cloth  6 each Topical Q0600  . latanoprost  1 drop Both Eyes QHS  . pantoprazole (PROTONIX) IV  40 mg Intravenous Q12H  . saccharomyces boulardii  250 mg Oral BID  . timolol  1 drop Both Eyes Daily  . warfarin  7.5 mg Oral ONCE-1800  . Warfarin - Pharmacist Dosing Inpatient   Does not apply q1800    acetaminophen, loperamide, ondansetron **OR** ondansetron (ZOFRAN) IV, sodium chloride  Iron/TIBC/Ferritin/ %Sat    Component Value Date/Time   IRON 41 10/29/2015 1000   TIBC 175 (L) 10/29/2015 1000   FERRITIN 312 (H) 10/29/2015 1000   IRONPCTSAT 23 10/29/2015 1000    Exam: General: WDWN, NAD, well appearing female, laying in bed Neck: Supple. No lymphadenopathy Lungs: CTA bilaterally. No wheeze, rales or rhonchi. Heart: RRR. +5/1 systolic murmur, no rubs or gallops.  Abdomen: soft, nontender, +BS, no guarding, no rebound tenderness  Lower extremities:no edema, ischemic changes Neuro: AAOx3. Moves all extremities spontaneously. Dialysis Access: LU AVF +b/t  Dialysis Orders:  TTS - [facility]  4hrs, BFR 400, DFR 800,  EDW 88kg, 2K/ 2Ca  Access: LU AVF  Heparin 1000 units Calcitriol 0.5 mcg PO qHD  Assessment/Plan: 10.  UGIB in setting of AOCKD- +FOBT, Hgb stable at 10.6. Not on ESA. Follow trends. EGD did not show any source of bleeding or active bleeding. GI gave OK to  resume coumadin.  11.  ESRD - HD TTS.  HD today.  12.  Hypertension/volume  - BP well controlled.  Minimal gains at OP.  Under edw since illness began.  Titrate down volume as tolerated keeping SBP>100.  13.  Secondary Hyperparathyroidism -  Ca and phos at goal. Continue VDRA and binders.  14.  Nutrition - Renal diet w/fluid restrictions. Alb 2.4, +protein supplements. Renal vitamin. 15.  CM/Hx HFrEF 16. PAF - cont amiodarone, coumadin restarting, per primary 17. Glaucoma - timolol drops 18. GERD - Protonix    [name] 08/25/2018, 3:32 PM  Recent Labs  Lab 08/24/18 0948 08/25/18 0440  NA 135 135  K 3.2* 3.3*  CL 97* 100  CO2 27 27  GLUCOSE 77 88  BUN 18 27*  CREATININE 4.52* 5.88*  CALCIUM 7.7* 8.3*  ALBUMIN 2.4* 2.6*  INR 2.8* 1.3*   Recent Labs  Lab 08/24/18 0948 08/25/18 0440  AST 35 28  ALT 29 27  ALKPHOS 54 60  BILITOT 0.9 0.7  PROT 4.6* 5.1*   Recent Labs  Lab 08/24/18 0948  08/25/18 0440 08/25/18 1131  WBC 5.4  --  5.8  --   NEUTROABS 4.0  --  4.0  --   HGB 10.6*   < > 9.9* 11.3*  HCT 31.7*   < >  30.0* 34.7*  MCV 92.7  --  92.9  --   PLT 189  --  188  --    < > = values in this interval not displayed.

## 2018-08-25 NOTE — Progress Notes (Signed)
Patient's endoscopy negative for source of heme positive stool or dark stools.  Please see dictated report for details.  The patient does have a history of a large hiatal hernia, which was confirmed endoscopically.  It is large complicated in configuration.  I will make arrangements for the patient to see me as an outpatient to consider further evaluation of the hiatal hernia and to monitor her Hemoccult status.  Recommendations:  1.  Have ordered patient to resume renal diet (she feels ready to eat) 2.  No contraindication to resumption of anticoagulation at this time 3.  From GI bleeding standpoint, patient is okay for discharge whenever you feel it is appropriate; however, based on her tolerance of diet, resolution of diarrhea and whether you want to achieve therapeutic INR prior to discharge, you might find that further the hospital is necessary. 4.  As mentioned above, we will contact patient for follow-up of her large hiatal hernia 5.  We will sign off.  However, please call us if you have any questions pertaining to this patient's care, or if we can be of further assistance in her case.  Cleotis Nipper, M.D. Pager [phone] If no answer or after 5 PM call [phone]

## 2018-08-25 NOTE — Anesthesia Preprocedure Evaluation (Signed)
Anesthesia Evaluation  Patient identified by MRN, date of birth, ID band Patient awake    Reviewed: Allergy & Precautions, NPO status , Patient's Chart, lab work & pertinent test results  History of Anesthesia Complications (+) PROLONGED EMERGENCE and history of anesthetic complications  Airway Mallampati: III  TM Distance: >3 FB Neck ROM: Full    Dental  (+) Teeth Intact   Pulmonary Recent URI , Resolved,    breath sounds clear to auscultation       Cardiovascular hypertension, (-) angina+ CAD and +CHF  + dysrhythmias Atrial Fibrillation  Rhythm:Regular     Neuro/Psych  Headaches, negative psych ROS   GI/Hepatic Neg liver ROS, hiatal hernia, GERD  ,? GI bleed, denies nausea   Endo/Other  negative endocrine ROS  Renal/GU ESRF and DialysisRenal diseaseLUE fistula/graft, last dialysis 3/5 without complications     Musculoskeletal negative musculoskeletal ROS (+)   Abdominal   Peds  Hematology  (+) anemia ,   Anesthesia Other Findings   Reproductive/Obstetrics                             Anesthesia Physical Anesthesia Plan  ASA: III  Anesthesia Plan: MAC   Post-op Pain Management:    Induction: Intravenous  PONV Risk Score and Plan: 2 and Treatment may vary due to age or medical condition and Propofol infusion  Airway Management Planned: Nasal Cannula  Additional Equipment: None  Intra-op Plan:   Post-operative Plan:   Informed Consent: I have reviewed the patients History and Physical, chart, labs and discussed the procedure including the risks, benefits and alternatives for the proposed anesthesia with the patient or authorized representative who has indicated his/her understanding and acceptance.     Dental advisory given  Plan Discussed with: CRNA and Surgeon  Anesthesia Plan Comments:         Anesthesia Quick Evaluation

## 2018-08-25 NOTE — Anesthesia Postprocedure Evaluation (Signed)
Anesthesia Post Note  Patient: [name]  Procedure(s) Performed: ESOPHAGOGASTRODUODENOSCOPY (EGD) WITH PROPOFOL (Left )     Patient location during evaluation: Endoscopy Anesthesia Type: MAC Level of consciousness: awake and alert Pain management: pain level controlled Vital Signs Assessment: post-procedure vital signs reviewed and stable Respiratory status: spontaneous breathing, nonlabored ventilation, respiratory function stable and patient connected to nasal cannula oxygen Cardiovascular status: stable and blood pressure returned to baseline Postop Assessment: no apparent nausea or vomiting Anesthetic complications: no    Last Vitals:  Vitals:   08/25/18 1147 08/25/18 1706  BP: (!) 130/51 (!) 120/47  Pulse: 64 66  Resp: 12 12  Temp: (!) 36.2 C 36.4 C  SpO2: 98% 96%    Last Pain:  Vitals:   08/25/18 1706  TempSrc: Oral  PainSc:                  [name]

## 2018-08-25 NOTE — Progress Notes (Signed)
Pt tx moved to 08/25/2018; called Candace Cruise, RN called and notified.

## 2018-08-25 NOTE — Interval H&P Note (Signed)
History and Physical Interval Note:  08/25/2018 9:45 AM  [name]  has presented today for surgery, with the diagnosis of dark stools, anemia.  The various methods of treatment have been discussed with the patient. After consideration of risks, benefits and other options for treatment, the patient has consented to  Procedure(s): ESOPHAGOGASTRODUODENOSCOPY (EGD) WITH PROPOFOL (Left) as a surgical intervention.  The patient's history has been reviewed, patient examined, no change in status, stable for surgery.  I have reviewed the patient's chart and labs.  Questions were answered to the patient's satisfaction.     [name]

## 2018-08-25 NOTE — Progress Notes (Signed)
ANTICOAGULATION CONSULT NOTE - Initial Consult  Pharmacy Consult for warfarin Indication: atrial fibrillation  Allergies  Allergen Reactions  . Vicodin [Hydrocodone-Acetaminophen] Nausea And Vomiting  . Chlorhexidine Rash    Sunburn    rash  . Percocet [Oxycodone-Acetaminophen] Nausea And Vomiting    Patient Measurements: Height: 5\' 3"  (160 cm) Weight: 190 lb (86.2 kg) IBW/kg (Calculated) : 52.4   Vital Signs: Temp: 97.2 F (36.2 C) (03/07 1147) Temp Source: Axillary (03/07 1147) BP: 130/51 (03/07 1147) Pulse Rate: 64 (03/07 1147)  Labs: Recent Labs    08/23/18 1605 08/24/18 0140 08/24/18 0147 08/24/18 0948 08/24/18 2257 08/25/18 0440 08/25/18 1131  HGB 11.2*  --  10.4* 10.6* 10.0* 9.9* 11.3*  HCT 34.3*  --  31.8* 31.7* 30.4* 30.0* 34.7*  PLT 191  --  195 189  --  188  --   APTT 39*  --   --   --   --   --   --   LABPROT 25.0*  --   --  28.9*  --  16.4*  --   INR 2.3*  --   --  2.8*  --  1.3*  --   CREATININE 2.64* 3.83*  --  4.52*  --  5.88*  --     Estimated Creatinine Clearance: 8.9 mL/min (A) (by C-G formula based on SCr of 5.88 mg/dL (H)).   Medical History: Past Medical History:  Diagnosis Date  . Cardiomyopathy ([place])    a. h/o LV dysfunction EF 20-25% in 2013 due to sepsis.>> improved to normal   . Chronic diastolic CHF (congestive heart failure) ([place])    10/ 2013 in setting of septic shock  . Complication of anesthesia    use a little anesthesia , per patient MD states she quit breathing (2016); hard to wake up  . ESRD (end stage renal disease) ([place])    dialysis Tues, Thurs, Sat [address], sees dr deterding   . GERD (gastroesophageal reflux disease)   . Glaucoma    both eyes  . H/O hiatal hernia    a. CT 2017: large gastric hiatal hernia.  Marland Kitchen Headache(784.0)    migraine hx of  . History of blood transfusion 04/13/2015   . History of echocardiogram    a. Echo 6/17: EF 60-65%, normal wall motion, mild MR, atrial septal lipomatous  hypertrophy, PASP 34 mmHg, possible trivial free-flowing pericardial effusion along RV free wall // b. Echo 5/17: Mild LVH, EF 55-60%, normal wall motion, grade 1 diastolic dysfunction, trivial MR, severe LAE, mild RAE, PASP 42 mmHg  . History of nephrostomy 04/11/2015   currently inplace 04/28/2015  removed now  . History of nuclear stress test    a. Myoview 1/14 - Marked ischemia in the basal anterior, mid anterior, apical septal and apical inferior regions, EF 63% >> LHC normal   . Hyperlipidemia   . Hypertension    medication removed from regimen due to low blood pressure   . Iron deficiency    hx  . Nephrolithiasis 2002, 2006   bilateral  . Normal coronary arteries 2014   a. LHC in 1/14: normal coronary arteries  . PAF (paroxysmal atrial fibrillation) ([place])    a. 10/ 2013  in setting of Septic Shock //  b. recurrent during admit for pneumonia, L effusion >> placed on Amiodarone // Coumadin for anticoagulation  . Pneumonia jan 2018, last tme lungs clear now   dx 10-06-2014 per CXR--  on 10-27-2014 pt states finished antibiotic and denies  cough or fever  . S/P hemodialysis catheter insertion ([place]) 04/11/2015    right anterior chest , only used once   . Sigmoid diverticulosis   . UTI (urinary tract infection) 05/10/2016    Medications:  Scheduled:  . amiodarone  200 mg Oral Daily  . calcium acetate  1,334 mg Oral TID WC  . cephALEXin  250 mg Oral Daily  . Chlorhexidine Gluconate Cloth  6 each Topical Q0600  . latanoprost  1 drop Both Eyes QHS  . pantoprazole (PROTONIX) IV  40 mg Intravenous Q12H  . saccharomyces boulardii  250 mg Oral BID  . timolol  1 drop Both Eyes Daily    Assessment: 73 yo F admitted for diarrhea/melena. PTA warfarin intitially held on admit, but endoscopy on 3/7 negative for GIB. Pharmacy consulted to resume warfarin. INR 1.3 today, patient did receive 5 mg of vit K on 3/6.   PTA warfarin regimen: 5mg  MWF, 2.5mg  all other days.   Goal of Therapy:  INR  2-3 Monitor platelets by anticoagulation protocol: Yes   Plan:  Warfarin 7.5mg  x1 Daily INR Monitor CBC, s/s bleeding  Harrietta Guardian, PharmD PGY1 Pharmacy Resident 08/25/2018    12:53 PM Please check AMION for all [place] numbers

## 2018-08-25 NOTE — Op Note (Signed)
[facility] Patient Name: [name] Procedure Date : 08/25/2018 MRN: [id number] Attending MD: Ronald Lobo , MD Date of Birth: 1946/02/16 CSN: [id number] Age: 73 Admit Type: Inpatient Procedure:                Upper GI endoscopy Indications:              Heme positive stool in a patient on coumadin                            (therapeutic INR), recent n/v/diarr c/w                            gastroenteritis, dark stools after taking                            PeptoBismol Providers:                Ronald Lobo, MD, Burtis Junes, RN, Laverda Sorenson,                            Technician, Dewitt Hoes, CRNA Referring MD:              Medicines:                Monitored Anesthesia Care Complications:            No immediate complications. Estimated Blood Loss:     Estimated blood loss: none. Procedure:                Pre-Anesthesia Assessment:                           - Prior to the procedure, a History and Physical                            was performed, and patient medications and                            allergies were reviewed. The patient's tolerance of                            previous anesthesia was also reviewed. The risks                            and benefits of the procedure and the sedation                            options and risks were discussed with the patient.                            All questions were answered, and informed consent                            was obtained. Prior Anticoagulants: The patient has                            taken Coumadin (  warfarin), last dose was 2 days                            prior to procedure. ASA Grade Assessment: III - A                            patient with severe systemic disease. After                            reviewing the risks and benefits, the patient was                            deemed in satisfactory condition to undergo the                            procedure.  After obtaining informed consent, the endoscope was                            passed under direct vision. Throughout the                            procedure, the patient's blood pressure, pulse, and                            oxygen saturations were monitored continuously. The                            GIF-H190 ([id number] Olympus gastroscope was                            introduced through the mouth, and advanced to the                            second part of duodenum. The upper GI endoscopy was                            technically difficult and complex due to unusual                            anatomy. The patient tolerated the procedure well. Scope In: Scope Out: Findings:      A non-obstructing and moderate Schatzki ring was found at the       gastroesophageal junction.      A very large hiatal hernia was present. It is estimated that 1/3 of the       stomach is above the diaphragm. During initial advancement of the scope,       the orientation and angulation of the hiatal hernia pouch was such that       it was very difficult to make the turn into the distal stomach, because       the scope preferentially coiled in the hiatal hernia pouch. Finally,       with loop reduction and persistent effort, I was able to reach the       distal stomach and duodenum. On retroflexed viewing, the  diaphragmatic       hiatus was noted to be very patulous, probably about 8 cm across.      The exam of the stomach was otherwise normal. Specifically, although       there was no evidence of Cameron erosions at the diaphragmatic hiatus,       nor gastric erosions, ulcers, polyps, AVM's, or gastritis.      There was a small amount of bile in the stomach at the start of the       procedure.      The examined duodenum was normal.      Upon retroflexed viewing, it appeared as though this was a sliding       hiatal hernia. Impression:               - No bleeding or source of heme positivity seen on                             this exam.                           - Non-obstructing moderate Schatzki ring.                           - Large hiatal hernia, with angulation of the pouch                            making for a difficult exam of the distal stomach.                           - Normal examined duodenum.                           - No specimens collected. Recommendation:           - Observe patient's clinical course.                           - Do an upper GI series at appointment to be                            scheduled. I will arrange outpatient f/u with me                            for this purpose. Procedure Code(s):        --- Professional ---                           [phone], Esophagogastroduodenoscopy, flexible,                            transoral; diagnostic, including collection of                            specimen(s) by brushing or washing, when performed                            (separate procedure) Diagnosis Code(s):        ---  Professional ---                           K22.2, Esophageal obstruction                           K44.9, Diaphragmatic hernia without obstruction or                            gangrene                           R19.5, Other fecal abnormalities CPT copyright 2018 [employer]. All rights reserved. The codes documented in this report are preliminary and upon coder review may  be revised to meet current compliance requirements. Ronald Lobo, MD 08/25/2018 10:41:28 AM This report has been signed electronically. Number of Addenda: 0

## 2018-08-26 ENCOUNTER — Encounter (HOSPITAL_COMMUNITY): Payer: Self-pay | Admitting: Gastroenterology

## 2018-08-26 DIAGNOSIS — A0811 Acute gastroenteropathy due to Norwalk agent: Secondary | ICD-10-CM

## 2018-08-26 DIAGNOSIS — Z7901 Long term (current) use of anticoagulants: Secondary | ICD-10-CM

## 2018-08-26 LAB — CBC WITH DIFFERENTIAL/PLATELET
Abs Immature Granulocytes: 0.13 10*3/uL — ABNORMAL HIGH (ref 0.00–0.07)
Basophils Absolute: 0 10*3/uL (ref 0.0–0.1)
Basophils Relative: 0 %
Eosinophils Absolute: 0.2 10*3/uL (ref 0.0–0.5)
Eosinophils Relative: 3 %
HCT: 31.5 % — ABNORMAL LOW (ref 36.0–46.0)
Hemoglobin: 10.4 g/dL — ABNORMAL LOW (ref 12.0–15.0)
Immature Granulocytes: 2 %
Lymphocytes Relative: 15 %
Lymphs Abs: 1.2 10*3/uL (ref 0.7–4.0)
MCH: 30.9 pg (ref 26.0–34.0)
MCHC: 33 g/dL (ref 30.0–36.0)
MCV: 93.5 fL (ref 80.0–100.0)
Monocytes Absolute: 0.7 10*3/uL (ref 0.1–1.0)
Monocytes Relative: 8 %
Neutro Abs: 5.9 10*3/uL (ref 1.7–7.7)
Neutrophils Relative %: 72 %
Platelets: 229 10*3/uL (ref 150–400)
RBC: 3.37 MIL/uL — ABNORMAL LOW (ref 3.87–5.11)
RDW: 14.7 % (ref 11.5–15.5)
WBC: 8.1 10*3/uL (ref 4.0–10.5)
nRBC: 0 % (ref 0.0–0.2)

## 2018-08-26 LAB — BASIC METABOLIC PANEL
Anion gap: 11 (ref 5–15)
BUN: 36 mg/dL — ABNORMAL HIGH (ref 8–23)
CO2: 27 mmol/L (ref 22–32)
Calcium: 8.8 mg/dL — ABNORMAL LOW (ref 8.9–10.3)
Chloride: 100 mmol/L (ref 98–111)
Creatinine, Ser: 6.98 mg/dL — ABNORMAL HIGH (ref 0.44–1.00)
GFR calc Af Amer: 6 mL/min — ABNORMAL LOW (ref >60)
GFR calc non Af Amer: 5 mL/min — ABNORMAL LOW (ref >60)
Glucose, Bld: 96 mg/dL (ref 70–99)
Potassium: 3.5 mmol/L (ref 3.5–5.1)
Sodium: 138 mmol/L (ref 135–145)

## 2018-08-26 LAB — PROTIME-INR
INR: 1.1 (ref 0.8–1.2)
Prothrombin Time: 14.3 seconds (ref 11.4–15.2)

## 2018-08-26 LAB — MAGNESIUM: Magnesium: 2.1 mg/dL (ref 1.7–2.4)

## 2018-08-26 MED ORDER — WARFARIN SODIUM 5 MG PO TABS
10.0000 mg | ORAL_TABLET | Freq: Once | ORAL | Status: AC
  Administered 2018-08-26: 10 mg via ORAL
  Filled 2018-08-26 (×2): qty 2

## 2018-08-26 NOTE — Progress Notes (Signed)
PROGRESS NOTE  [name] DDU:[id number] DOB: Jun 25, 1945 DOA: 08/23/2018 PCP: Lajean Manes, MD  HPI/Recap of past 24 hours:  Diarrhea, n/v resolved, she is tested + for Norovirus by gi panel  denies ab pain, no fever, hgb stable She did not get her dialysis yesterday due to dialysis unit of over capacity, she did not get dialyzed today due to access issues   Assessment/Plan: Principal Problem:   UGI bleed Active Problems:   Glaucoma   Essential hypertension, benign   ESRD (end stage renal disease) (HCC)   Anemia of renal disease   PAF (paroxysmal atrial fibrillation) (HCC)   Hypokalemia   Hyperlipidemia   GERD (gastroesophageal reflux disease)   Hypertension   Melena   Hiatal hernia  Diarrhea/Melena/FOBT+ --hold coumadin held since admission, she received vitkx1 on 3/6 -s/p EGD, +large hiatal hernia, no overt bleed, GI oked to restart coumadin -She is tested + for norovirus -gi symptom resolved  PAF maintained sinus rhythm on amiodarone,  coumadin held since admission, resumed on 3/7 after EGD per gi recommendation   Monitor INR daily   Hypokalemia: Likely due to vomiting and diarrhea k replaced   ESRD on HD TTS oliguria  Plan per nephrology    h/o bilateral kidney stones, h/o right sided nephrostomy tube in 2017, removed,  H/o pyelonephritis, h/o recurrent uti On chronic abx keflex daily, on probiotics  H/o C diff  Reports diarrhea resolved, last bm was formed, no ab pain, no fever, no leukocytosis  Code Status: full  Family Communication: patient and daughter at bedside on 3/6  Disposition Plan: home in am if able to get dialysis   Consultants:  GI  nephrology  Procedures:  EGD on 3/7   Dialysis   Antibiotics:  Chronic keflex daily   Objective: BP (!) 135/52 (BP Location: Right Arm)   Pulse 68   Temp 97.9 F (36.6 C) (Oral)   Resp 20   Ht 5\' 3"  (1.6 m)   Wt 86.2 kg   LMP 01/19/1992 (Approximate)   SpO2 96%   BMI 33.66  kg/m  No intake or output data in the 24 hours ending 08/26/18 1639 Filed Weights   08/23/18 1547 08/25/18 0927  Weight: 86.2 kg 86.2 kg    Exam: Patient is examined daily including today on 08/26/2018, exams remain the same as of yesterday except that has changed    General:  NAD  Cardiovascular: RRR  Respiratory: CTABL  Abdomen: Soft/ND/NT, positive BS  Musculoskeletal: No Edema  Neuro: alert, oriented   Data Reviewed: Basic Metabolic Panel: Recent Labs  Lab 08/23/18 1605 08/23/18 1616 08/24/18 0140 08/24/18 0948 08/25/18 0440 08/26/18 0825  NA 135  --  134* 135 135 138  K 2.5*  --  2.9* 3.2* 3.3* 3.5  CL 93*  --  95* 97* 100 100  CO2 27  --  31 27 27 27   GLUCOSE 69*  --  86 77 88 96  BUN 8  --  14 18 27* 36*  CREATININE 2.64*  --  3.83* 4.52* 5.88* 6.98*  CALCIUM 7.6*  --  7.7* 7.7* 8.3* 8.8*  MG  --  1.9  --   --   --  2.1   Liver Function Tests: Recent Labs  Lab 08/24/18 0948 08/25/18 0440  AST 35 28  ALT 29 27  ALKPHOS 54 60  BILITOT 0.9 0.7  PROT 4.6* 5.1*  ALBUMIN 2.4* 2.6*   No results for input(s): LIPASE, AMYLASE in the last  168 hours. No results for input(s): AMMONIA in the last 168 hours. CBC: Recent Labs  Lab 08/23/18 1605 08/24/18 0147 08/24/18 0948 08/24/18 2257 08/25/18 0440 08/25/18 1131 08/26/18 0825  WBC 4.4 5.7 5.4  --  5.8  --  8.1  NEUTROABS 2.9  --  4.0  --  4.0  --  5.9  HGB 11.2* 10.4* 10.6* 10.0* 9.9* 11.3* 10.4*  HCT 34.3* 31.8* 31.7* 30.4* 30.0* 34.7* 31.5*  MCV 94.0 92.2 92.7  --  92.9  --  93.5  PLT 191 195 189  --  188  --  229   Cardiac Enzymes:   No results for input(s): CKTOTAL, CKMB, CKMBINDEX, TROPONINI in the last 168 hours. BNP (last 3 results) No results for input(s): BNP in the last 8760 hours.  ProBNP (last 3 results) No results for input(s): PROBNP in the last 8760 hours.  CBG: No results for input(s): GLUCAP in the last 168 hours.  Recent Results (from the past 240 hour(s))    Gastrointestinal Panel by PCR , Stool     Status: Abnormal   Collection Time: 08/24/18  8:07 AM  Result Value Ref Range Status   Campylobacter species NOT DETECTED NOT DETECTED Final   Plesimonas shigelloides NOT DETECTED NOT DETECTED Final   Salmonella species NOT DETECTED NOT DETECTED Final   Yersinia enterocolitica NOT DETECTED NOT DETECTED Final   Vibrio species NOT DETECTED NOT DETECTED Final   Vibrio cholerae NOT DETECTED NOT DETECTED Final   Enteroaggregative E coli (EAEC) NOT DETECTED NOT DETECTED Final   Enteropathogenic E coli (EPEC) NOT DETECTED NOT DETECTED Final   Enterotoxigenic E coli (ETEC) NOT DETECTED NOT DETECTED Final   Shiga like toxin producing E coli (STEC) NOT DETECTED NOT DETECTED Final   Shigella/Enteroinvasive E coli (EIEC) NOT DETECTED NOT DETECTED Final   Cryptosporidium NOT DETECTED NOT DETECTED Final   Cyclospora cayetanensis NOT DETECTED NOT DETECTED Final   Entamoeba histolytica NOT DETECTED NOT DETECTED Final   Giardia lamblia NOT DETECTED NOT DETECTED Final   Adenovirus F40/41 NOT DETECTED NOT DETECTED Final   Astrovirus NOT DETECTED NOT DETECTED Final   Norovirus GI/GII DETECTED (A) NOT DETECTED Final    Comment: RESULT CALLED TO, READ BACK BY AND VERIFIED WITH: TRANACE DAYE AT 1511 ON 08/25/2018 [place].    Rotavirus A NOT DETECTED NOT DETECTED Final   Sapovirus (I, II, IV, and V) NOT DETECTED NOT DETECTED Final    Comment: Performed at [facility], [address] [place], [place] [address]  MRSA PCR Screening     Status: None   Collection Time: 08/24/18 10:11 AM  Result Value Ref Range Status   MRSA by PCR NEGATIVE NEGATIVE Final    Comment:        The GeneXpert MRSA Assay (FDA approved for NASAL specimens only), is one component of a comprehensive MRSA colonization surveillance program. It is not intended to diagnose MRSA infection nor to guide or monitor treatment for MRSA infections. Performed at [facility], [place] [address] [place], [place] [address]      Studies: No results found.  Scheduled Meds: . amiodarone  200 mg Oral Daily  . calcium acetate  1,334 mg Oral TID WC  . cephALEXin  250 mg Oral Daily  . latanoprost  1 drop Both Eyes QHS  . pantoprazole (PROTONIX) IV  40 mg Intravenous Q12H  . saccharomyces boulardii  250 mg Oral BID  . timolol  1 drop Both Eyes Daily  .  warfarin  10 mg Oral ONCE-1800  . Warfarin - Pharmacist Dosing Inpatient   Does not apply q1800    Continuous Infusions:   Time spent: 19mins,  I have personally reviewed and interpreted on  08/26/2018 daily labs,  imagings as discussed above under date review session and assessment and plans.  I reviewed all nursing notes, pharmacy notes, consultant notes,  vitals, pertinent old records  I have discussed plan of care as described above with RN , patient on 08/26/2018   Florencia Reasons MD, PhD  [facility] Pager [phone]. If 7PM-7AM, please contact night-coverage at [email or web address], password [facility] 08/26/2018, 4:39 PM  LOS: 3 days

## 2018-08-26 NOTE — Progress Notes (Signed)
[name] Progress Note  Subjective: Hb stable at 10.4.  Unable to stick AVF successfully today, did not get HD today.   Vitals:   08/26/18 0405 08/26/18 0800 08/26/18 0900 08/26/18 1518  BP: (!) 120/52 (!) 114/58 (!) 120/50 (!) 135/52  Pulse: 63 64 64 68  Resp:  (!) 21 14 20   Temp: (!) 97.5 F (36.4 C)  97.8 F (36.6 C) 97.9 F (36.6 C)  TempSrc: Oral  Oral Oral  SpO2: 94% 100% 96% 96%  Weight:      Height:        Inpatient medications: . amiodarone  200 mg Oral Daily  . calcium acetate  1,334 mg Oral TID WC  . cephALEXin  250 mg Oral Daily  . latanoprost  1 drop Both Eyes QHS  . pantoprazole (PROTONIX) IV  40 mg Intravenous Q12H  . saccharomyces boulardii  250 mg Oral BID  . timolol  1 drop Both Eyes Daily  . warfarin  10 mg Oral ONCE-1800  . Warfarin - Pharmacist Dosing Inpatient   Does not apply q1800    acetaminophen, loperamide, ondansetron **OR** ondansetron (ZOFRAN) IV, sodium chloride  Iron/TIBC/Ferritin/ %Sat    Component Value Date/Time   IRON 41 10/29/2015 1000   TIBC 175 (L) 10/29/2015 1000   FERRITIN 312 (H) 10/29/2015 1000   IRONPCTSAT 23 10/29/2015 1000    Exam: General: WDWN, NAD, well appearing female, laying in bed Neck: Supple. No lymphadenopathy Lungs: CTA bilaterally. No wheeze, rales or rhonchi. Heart: RRR. +5/8 systolic murmur, no rubs or gallops.  Abdomen: soft, nontender, +BS, no guarding, no rebound tenderness  Lower extremities:no edema, ischemic changes Neuro: AAOx3. Moves all extremities spontaneously. Dialysis Access: LU AVF +b/t  Dialysis Orders:  TTS - [facility]  4hrs, BFR 400, DFR 800,  EDW 88kg, 2K/ 2Ca  Access: LU AVF  Heparin 1000 units Calcitriol 0.5 mcg PO qHD  Assessment/Plan: 10.  UGIB in setting of AOCKD- +FOBT, Hgb stable at 10.6. Not on ESA. Follow trends. EGD did not show any source of bleeding or active bleeding. GI gave OK to resume coumadin.  11.  ESRD - HD TTS.  Unable to stick AVF  successfully today. Will bring back for HD 1st shift in am tomorrow.  +bruit.   12.  Hypertension/volume  - BP well controlled.  Minimal gains at OP.  Under edw since illness began.  Titrate down volume as tolerated keeping SBP>100.  13.  Secondary Hyperparathyroidism -  Ca and phos at goal. Continue VDRA and binders.  14.  Nutrition - Renal diet w/fluid restrictions. Alb 2.4, +protein supplements. Renal vitamin. 15.  CM/Hx HFrEF 16. PAF - cont amiodarone, coumadin restarting, per primary 17. Glaucoma - timolol drops 18. GERD - Protonix  19. Dispo - stable for dc if can get HD in am.    [facility] 08/26/2018, 3:48 PM  Recent Labs  Lab 08/24/18 0948 08/25/18 0440 08/26/18 0825  NA 135 135 138  K 3.2* 3.3* 3.5  CL 97* 100 100  CO2 27 27 27   GLUCOSE 77 88 96  BUN 18 27* 36*  CREATININE 4.52* 5.88* 6.98*  CALCIUM 7.7* 8.3* 8.8*  ALBUMIN 2.4* 2.6*  --   INR 2.8* 1.3* 1.1   Recent Labs  Lab 08/24/18 0948 08/25/18 0440  AST 35 28  ALT 29 27  ALKPHOS 54 60  BILITOT 0.9 0.7  PROT 4.6* 5.1*   Recent Labs  Lab 08/25/18 0440 08/25/18 1131 08/26/18 0825  WBC 5.8  --  8.1  NEUTROABS 4.0  --  5.9  HGB 9.9* 11.3* 10.4*  HCT 30.0* 34.7* 31.5*  MCV 92.9  --  93.5  PLT 188  --  229

## 2018-08-26 NOTE — Progress Notes (Signed)
ANTICOAGULATION CONSULT NOTE - Initial Consult  Pharmacy Consult for warfarin Indication: atrial fibrillation  Allergies  Allergen Reactions  . Vicodin [Hydrocodone-Acetaminophen] Nausea And Vomiting  . Chlorhexidine Rash    Sunburn    rash  . Percocet [Oxycodone-Acetaminophen] Nausea And Vomiting    Patient Measurements: Height: 5\' 3"  (160 cm) Weight: 190 lb (86.2 kg) IBW/kg (Calculated) : 52.4   Vital Signs: Temp: 97.5 F (36.4 C) (03/08 0405) Temp Source: Oral (03/08 0405) BP: 114/58 (03/08 0800) Pulse Rate: 64 (03/08 0800)  Labs: Recent Labs    08/23/18 1605  08/24/18 0948  08/25/18 0440 08/25/18 1131 08/26/18 0825  HGB 11.2*   < > 10.6*   < > 9.9* 11.3* 10.4*  HCT 34.3*   < > 31.7*   < > 30.0* 34.7* 31.5*  PLT 191   < > 189  --  188  --  229  APTT 39*  --   --   --   --   --   --   LABPROT 25.0*  --  28.9*  --  16.4*  --  14.3  INR 2.3*  --  2.8*  --  1.3*  --  1.1  CREATININE 2.64*   < > 4.52*  --  5.88*  --  6.98*   < > = values in this interval not displayed.    Estimated Creatinine Clearance: 7.5 mL/min (A) (by C-G formula based on SCr of 6.98 mg/dL (H)).   Medical History: Past Medical History:  Diagnosis Date  . Cardiomyopathy ([place])    a. h/o LV dysfunction EF 20-25% in 2013 due to sepsis.>> improved to normal   . Chronic diastolic CHF (congestive heart failure) ([place])    10/ 2013 in setting of septic shock  . Complication of anesthesia    use a little anesthesia , per patient MD states she quit breathing (2016); hard to wake up  . ESRD (end stage renal disease) ([place])    dialysis Tues, Thurs, Sat [address], sees dr deterding   . GERD (gastroesophageal reflux disease)   . Glaucoma    both eyes  . H/O hiatal hernia    a. CT 2017: large gastric hiatal hernia.  Marland Kitchen Headache(784.0)    migraine hx of  . History of blood transfusion 04/13/2015   . History of echocardiogram    a. Echo 6/17: EF 60-65%, normal wall motion, mild MR, atrial septal  lipomatous hypertrophy, PASP 34 mmHg, possible trivial free-flowing pericardial effusion along RV free wall // b. Echo 5/17: Mild LVH, EF 55-60%, normal wall motion, grade 1 diastolic dysfunction, trivial MR, severe LAE, mild RAE, PASP 42 mmHg  . History of nephrostomy 04/11/2015   currently inplace 04/28/2015  removed now  . History of nuclear stress test    a. Myoview 1/14 - Marked ischemia in the basal anterior, mid anterior, apical septal and apical inferior regions, EF 63% >> LHC normal   . Hyperlipidemia   . Hypertension    medication removed from regimen due to low blood pressure   . Iron deficiency    hx  . Nephrolithiasis 2002, 2006   bilateral  . Normal coronary arteries 2014   a. LHC in 1/14: normal coronary arteries  . PAF (paroxysmal atrial fibrillation) ([place])    a. 10/ 2013  in setting of Septic Shock //  b. recurrent during admit for pneumonia, L effusion >> placed on Amiodarone // Coumadin for anticoagulation  . Pneumonia jan 2018, last tme lungs clear now  dx 10-06-2014 per CXR--  on 10-27-2014 pt states finished antibiotic and denies cough or fever  . S/P hemodialysis catheter insertion ([place]) 04/11/2015    right anterior chest , only used once   . Sigmoid diverticulosis   . UTI (urinary tract infection) 05/10/2016    Medications:  Scheduled:  . amiodarone  200 mg Oral Daily  . calcium acetate  1,334 mg Oral TID WC  . cephALEXin  250 mg Oral Daily  . latanoprost  1 drop Both Eyes QHS  . pantoprazole (PROTONIX) IV  40 mg Intravenous Q12H  . saccharomyces boulardii  250 mg Oral BID  . timolol  1 drop Both Eyes Daily  . Warfarin - Pharmacist Dosing Inpatient   Does not apply q1800    Assessment: 73 yo F admitted for diarrhea/melena. PTA warfarin intitially held on admit, but endoscopy on 3/7 negative for GIB. Pharmacy consulted to resume warfarin. Patient received 5 mg of vit K on 3/6. INR subtherapeutic today at 1.1.  PTA warfarin regimen: 5mg  MWF, 2.5mg  all  other days.   Goal of Therapy:  INR 2-3 Monitor platelets by anticoagulation protocol: Yes   Plan:  Warfarin 10 mg x1 Daily INR Monitor CBC, s/s bleeding  Harrietta Guardian, PharmD PGY1 Pharmacy Resident 08/26/2018    10:31 AM Please check AMION for all [place] numbers

## 2018-08-26 NOTE — Progress Notes (Signed)
Unable to successfully cannulate venous access for hemodialysis treatment. 3 attempts made. Dr. Jonnie Finner made aware orders to return patient back to room. Pt. Stable. Alert and oriented

## 2018-08-26 NOTE — Progress Notes (Signed)
Pt left unit to dialysis about 120 pm. Alert and oriented. OOB to bathroom just prior to leaving unit. No distress evidenced or voiced by patient. Simmie Davies RN

## 2018-08-27 LAB — RENAL FUNCTION PANEL
Albumin: 2.6 g/dL — ABNORMAL LOW (ref 3.5–5.0)
Anion gap: 11 (ref 5–15)
BUN: 45 mg/dL — ABNORMAL HIGH (ref 8–23)
CO2: 25 mmol/L (ref 22–32)
Calcium: 8.3 mg/dL — ABNORMAL LOW (ref 8.9–10.3)
Chloride: 100 mmol/L (ref 98–111)
Creatinine, Ser: 7.81 mg/dL — ABNORMAL HIGH (ref 0.44–1.00)
GFR calc Af Amer: 5 mL/min — ABNORMAL LOW (ref >60)
GFR calc non Af Amer: 5 mL/min — ABNORMAL LOW (ref >60)
Glucose, Bld: 95 mg/dL (ref 70–99)
Phosphorus: 3.1 mg/dL (ref 2.5–4.6)
Potassium: 3.8 mmol/L (ref 3.5–5.1)
Sodium: 136 mmol/L (ref 135–145)

## 2018-08-27 LAB — CBC WITH DIFFERENTIAL/PLATELET
Abs Immature Granulocytes: 0.13 10*3/uL — ABNORMAL HIGH (ref 0.00–0.07)
Basophils Absolute: 0 10*3/uL (ref 0.0–0.1)
Basophils Relative: 0 %
Eosinophils Absolute: 0.2 10*3/uL (ref 0.0–0.5)
Eosinophils Relative: 3 %
HCT: 28.9 % — ABNORMAL LOW (ref 36.0–46.0)
Hemoglobin: 9.2 g/dL — ABNORMAL LOW (ref 12.0–15.0)
Immature Granulocytes: 2 %
Lymphocytes Relative: 16 %
Lymphs Abs: 1.1 10*3/uL (ref 0.7–4.0)
MCH: 30 pg (ref 26.0–34.0)
MCHC: 31.8 g/dL (ref 30.0–36.0)
MCV: 94.1 fL (ref 80.0–100.0)
Monocytes Absolute: 0.6 10*3/uL (ref 0.1–1.0)
Monocytes Relative: 8 %
Neutro Abs: 4.8 10*3/uL (ref 1.7–7.7)
Neutrophils Relative %: 71 %
Platelets: 203 10*3/uL (ref 150–400)
RBC: 3.07 MIL/uL — ABNORMAL LOW (ref 3.87–5.11)
RDW: 15 % (ref 11.5–15.5)
WBC: 6.8 10*3/uL (ref 4.0–10.5)
nRBC: 0 % (ref 0.0–0.2)

## 2018-08-27 LAB — HEPATITIS B SURFACE ANTIGEN: Hepatitis B Surface Ag: NEGATIVE

## 2018-08-27 LAB — PROTIME-INR
INR: 1.6 — ABNORMAL HIGH (ref 0.8–1.2)
Prothrombin Time: 18.6 seconds — ABNORMAL HIGH (ref 11.4–15.2)

## 2018-08-27 MED ORDER — PENTAFLUOROPROP-TETRAFLUOROETH EX AERO: 1.0000 "application " | INHALATION_SPRAY | CUTANEOUS | Status: DC | PRN

## 2018-08-27 MED ORDER — SODIUM CHLORIDE 0.9 % IV SOLN: 100.0000 mL | INTRAVENOUS | Status: DC | PRN

## 2018-08-27 MED ORDER — ALTEPLASE 2 MG IJ SOLR: 2.0000 mg | Freq: Once | INTRAMUSCULAR | Status: DC | PRN

## 2018-08-27 MED ORDER — LIDOCAINE-PRILOCAINE 2.5-2.5 % EX CREA
1.0000 "application " | TOPICAL_CREAM | CUTANEOUS | Status: DC | PRN
  Filled 2018-08-27: qty 5

## 2018-08-27 MED ORDER — LIDOCAINE HCL (PF) 1 % IJ SOLN: 5.0000 mL | INTRAMUSCULAR | Status: DC | PRN

## 2018-08-27 MED ORDER — WARFARIN SODIUM 5 MG PO TABS
5.0000 mg | ORAL_TABLET | Freq: Once | ORAL | Status: DC
  Filled 2018-08-27: qty 1

## 2018-08-27 MED ORDER — PANTOPRAZOLE SODIUM 40 MG PO TBEC
40.0000 mg | DELAYED_RELEASE_TABLET | Freq: Two times a day (BID) | ORAL | Status: DC
  Administered 2018-08-27: 40 mg via ORAL
  Filled 2018-08-27 (×2): qty 1

## 2018-08-27 MED ORDER — HEPARIN SODIUM (PORCINE) 1000 UNIT/ML DIALYSIS: 1000.0000 [IU] | INTRAMUSCULAR | Status: DC | PRN

## 2018-08-27 NOTE — Procedures (Signed)
Patient seen and examined on Hemodialysis. BP (!) 105/50   Pulse 72   Temp 97.7 F (36.5 C) (Oral)   Resp 20   Ht 5\' 3"  (1.6 m)   Wt 86.9 kg   LMP 01/19/1992 (Approximate)   SpO2 93%   BMI 33.94 kg/m   QB 400 mL/ min LUE AVF, UF goal even.  Tolerating treatment without complaints at this time.   Madelon Lips MD [facility]  pgr [phone] 9:51 AM

## 2018-08-27 NOTE — Progress Notes (Signed)
ANTICOAGULATION CONSULT NOTE - Initial Consult  Pharmacy Consult for warfarin Indication: atrial fibrillation  Allergies  Allergen Reactions  . Vicodin [Hydrocodone-Acetaminophen] Nausea And Vomiting  . Chlorhexidine Rash    Sunburn    rash  . Percocet [Oxycodone-Acetaminophen] Nausea And Vomiting    Patient Measurements: Height: 5\' 3"  (160 cm) Weight: 191 lb 9.3 oz (86.9 kg) IBW/kg (Calculated) : 52.4   Vital Signs: Temp: 97.7 F (36.5 C) (03/09 0730) Temp Source: Oral (03/09 0730) BP: 105/50 (03/09 0930) Pulse Rate: 72 (03/09 0930)  Labs: Recent Labs    08/25/18 0440 08/25/18 1131 08/26/18 0825 08/27/18 0430 08/27/18 0811  HGB 9.9* 11.3* 10.4* 9.2*  --   HCT 30.0* 34.7* 31.5* 28.9*  --   PLT 188  --  229 203  --   LABPROT 16.4*  --  14.3 18.6*  --   INR 1.3*  --  1.1 1.6*  --   CREATININE 5.88*  --  6.98*  --  7.81*    Estimated Creatinine Clearance: 6.7 mL/min (A) (by C-G formula based on SCr of 7.81 mg/dL (H)).   Medical History: Past Medical History:  Diagnosis Date  . Cardiomyopathy ([place])    a. h/o LV dysfunction EF 20-25% in 2013 due to sepsis.>> improved to normal   . Chronic diastolic CHF (congestive heart failure) ([place])    10/ 2013 in setting of septic shock  . Complication of anesthesia    use a little anesthesia , per patient MD states she quit breathing (2016); hard to wake up  . ESRD (end stage renal disease) ([place])    dialysis Tues, Thurs, Sat [address], sees dr deterding   . GERD (gastroesophageal reflux disease)   . Glaucoma    both eyes  . H/O hiatal hernia    a. CT 2017: large gastric hiatal hernia.  Marland Kitchen Headache(784.0)    migraine hx of  . History of blood transfusion 04/13/2015   . History of echocardiogram    a. Echo 6/17: EF 60-65%, normal wall motion, mild MR, atrial septal lipomatous hypertrophy, PASP 34 mmHg, possible trivial free-flowing pericardial effusion along RV free wall // b. Echo 5/17: Mild LVH, EF 55-60%, normal wall  motion, grade 1 diastolic dysfunction, trivial MR, severe LAE, mild RAE, PASP 42 mmHg  . History of nephrostomy 04/11/2015   currently inplace 04/28/2015  removed now  . History of nuclear stress test    a. Myoview 1/14 - Marked ischemia in the basal anterior, mid anterior, apical septal and apical inferior regions, EF 63% >> LHC normal   . Hyperlipidemia   . Hypertension    medication removed from regimen due to low blood pressure   . Iron deficiency    hx  . Nephrolithiasis 2002, 2006   bilateral  . Normal coronary arteries 2014   a. LHC in 1/14: normal coronary arteries  . PAF (paroxysmal atrial fibrillation) ([place])    a. 10/ 2013  in setting of Septic Shock //  b. recurrent during admit for pneumonia, L effusion >> placed on Amiodarone // Coumadin for anticoagulation  . Pneumonia jan 2018, last tme lungs clear now   dx 10-06-2014 per CXR--  on 10-27-2014 pt states finished antibiotic and denies cough or fever  . S/P hemodialysis catheter insertion ([place]) 04/11/2015    right anterior chest , only used once   . Sigmoid diverticulosis   . UTI (urinary tract infection) 05/10/2016    Medications:  Scheduled:  . amiodarone  200 mg Oral  Daily  . calcium acetate  1,334 mg Oral TID WC  . cephALEXin  250 mg Oral Daily  . latanoprost  1 drop Both Eyes QHS  . pantoprazole  40 mg Oral BID  . saccharomyces boulardii  250 mg Oral BID  . timolol  1 drop Both Eyes Daily  . Warfarin - Pharmacist Dosing Inpatient   Does not apply q1800    Assessment: 73 yo F admitted for diarrhea/melena. PTA warfarin intitially held on admit, but endoscopy on 3/7 negative for GIB. Pharmacy consulted to resume warfarin. Patient received 5 mg of vit K on 3/6. INR subtherapeutic today at 1.1.  PTA warfarin regimen: 5mg  MWF, 2.5mg  all other days.   Goal of Therapy:  INR 2-3 Monitor platelets by anticoagulation protocol: Yes   Plan:  Resume warfarin PTA dosing (warfarin 5 mg MWF, 2.5mg  all other days)  Daily  INR while inpatient  Follow up out patient in 2-3 days with Coumadin Clinic Monitor CBC, s/s bleeding  Azzie Roup D PGY1 Pharmacy Resident  Phone [phone] Please use [facility] for clinical pharmacists numbers  08/27/2018      9:46 AM

## 2018-08-27 NOTE — Progress Notes (Signed)
Discharge instructions given to pt. Questions answered. IV removed. No new prescriptions. No equipment to give. Pt taken to car via wheelchair with all belongings.

## 2018-08-27 NOTE — Progress Notes (Signed)
PT Cancellation Note  Patient Details Name: YULITZA SHORTS MRN: [id number] DOB: 07-05-1945   Cancelled Treatment:    Reason Eval/Treat Not Completed: Patient at procedure or test/unavailable HD   Darby Fleeman B Kateria Cutrona 08/27/2018, 8:37 AM Elwyn Reach, PT Acute Rehabilitation Services Pager: [phone] Office: [phone]

## 2018-08-27 NOTE — Progress Notes (Addendum)
[facility] Progress Note   Assessment/ Plan:   Dialysis Orders: TTS - [facility] 4hrs, BFR 400, DFR 800, EDW 88kg, 2K/ 2Ca  Access: LU AVF  Heparin 1000 units Calcitriol 0.5 mcg PO qHD  Assessment/Plan: 1UGIB in setting of AOCKD- +FOBT, Hgb stable at 10.6. Not on ESA. Follow trends. EGD did not show any source of bleeding or active bleeding. GI gave OK to resume coumadin. GI pathogen panel + for norovirus.   2. ESRD - HD TTS. Unable to stick AVF successfully Sat and Sun. Able to stick today, Monday.  Appears that there is a resolving infiltration which occurred as OP.  If repeated infiltrations could consider fgram--> access flows as OP are OK.     3. Hypertension/volume - BP well controlled. Minimal gains at OP. Under edw since illness began. Titrate down volume as tolerated keeping SBP>100.  4.  Secondary Hyperparathyroidism - Ca and phos at goal. Continue VDRA and binders.  5. Nutrition - Renal diet w/fluid restrictions. Alb 2.4, +protein supplements. Renal vitamin. 6.  CM/Hx HFrEF 7.  PAF - cont amiodarone, coumadin restarting, per primary 8.  Glaucoma - timolol drops 9.  ERD - Protonix  10. Dispo - stable for dc   Subjective:    Seen on HD.  Feeling much better.  Able to cannulate successfully- per RN staff appears that difficulty cannulating the past few days d/t previous infiltration (happened Tuesday at OP dialysis).     Objective:   BP (!) 105/50   Pulse 72   Temp 97.7 F (36.5 C) (Oral)   Resp 20   Ht 5\' 3"  (1.6 m)   Wt 86.9 kg   LMP 01/19/1992 (Approximate)   SpO2 93%   BMI 33.94 kg/m   Physical Exam: Gen: NAD CVS: RRR Resp: clear bilaterally Abd: obese  Ext: no LE edema ACCESS: LUE AVF + T/B, accessed, resolving sig infiltration  Labs: BMET Recent Labs  Lab 08/23/18 1605 08/24/18 0140 08/24/18 0948 08/25/18 0440 08/26/18 0825 08/27/18 0811  NA 135 134* 135 135 138 136  K 2.5* 2.9* 3.2* 3.3* 3.5 3.8  CL 93* 95* 97* 100  100 100  CO2 27 31 27 27 27 25   GLUCOSE 69* 86 77 88 96 95  BUN 8 14 18  27* 36* 45*  CREATININE 2.64* 3.83* 4.52* 5.88* 6.98* 7.81*  CALCIUM 7.6* 7.7* 7.7* 8.3* 8.8* 8.3*  PHOS  --   --   --   --   --  3.1   CBC Recent Labs  Lab 08/24/18 0948  08/25/18 0440 08/25/18 1131 08/26/18 0825 08/27/18 0430  WBC 5.4  --  5.8  --  8.1 6.8  NEUTROABS 4.0  --  4.0  --  5.9 4.8  HGB 10.6*   < > 9.9* 11.3* 10.4* 9.2*  HCT 31.7*   < > 30.0* 34.7* 31.5* 28.9*  MCV 92.7  --  92.9  --  93.5 94.1  PLT 189  --  188  --  229 203   < > = values in this interval not displayed.    @IMGRELPRIORS @ Medications:    . amiodarone  200 mg Oral Daily  . calcium acetate  1,334 mg Oral TID WC  . cephALEXin  250 mg Oral Daily  . latanoprost  1 drop Both Eyes QHS  . pantoprazole  40 mg Oral BID  . saccharomyces boulardii  250 mg Oral BID  . timolol  1 drop Both Eyes Daily  . Warfarin - Pharmacist  Dosing Inpatient   Does not apply [place], MD [facility] pgr [phone] 08/27/2018, 9:43 AM

## 2018-08-27 NOTE — Discharge Summary (Signed)
Discharge Summary  [name] WUJ:[id number] DOB: 12-30-1945  PCP: Amy Manes, MD  Admit date: 08/23/2018 Discharge date: 08/27/2018  Time spent: 4mins, more than 50% time spent on coordination of care.  Recommendations for Outpatient Follow-up:  1. F/u with PCP within a week  for hospital discharge follow up, repeat cbc/bmp at follow up 2. F/u with coumadin clinic 3. Continue dialysis 4. F/u with [facility]  Discharge Diagnoses:  Active Hospital Problems   Diagnosis Date Noted  . UGI bleed 08/23/2018  . Gastroenteritis due to norovirus   . Anticoagulated on Coumadin   . Hiatal hernia   . Melena   . Hypokalemia 08/23/2018  . Hyperlipidemia 08/23/2018  . GERD (gastroesophageal reflux disease) 08/23/2018  . Hypertension 08/23/2018  . PAF (paroxysmal atrial fibrillation) ([place]) 11/05/2015  . Anemia of renal disease 08/14/2015  . ESRD (end stage renal disease) ([place])   . Essential hypertension, benign 08/14/2013  . Glaucoma     Resolved Hospital Problems  No resolved problems to display.    Discharge Condition: stable  Diet recommendation: heart healthy/renal diet  Filed Weights   08/25/18 0927 08/27/18 0730 08/27/18 1136  Weight: 86.2 kg 86.9 kg 85.5 kg    History of present illness: (per admitting MD Dr [name]) PCP: Amy Manes, MD   Patient coming from: Home.  I have personally briefly reviewed patient's old medical records in [place]  Chief Complaint: Diarrhea.  HPI: [name] is a 73 y.o. female with medical history significant of cardiomyopathy, chronic diastolic CHF, ESRD on hemodialysis, GERD, hiatal hernia, glaucoma, history of nephrostomy, hyperlipidemia, hypertension, ESRD anemia, nephrolithiasis, history of pneumonia, history of C. difficile who is brought to the emergency department due to having diarrhea for the past 4 days and melena since yesterday.  She also complains of having some nausea and emesis, but none since  yesterday.  She states that she was positive for the flu about a week ago.  She denies fever, chills, further sore throat or rhinorrhea.  Denies dyspnea, wheezing or hemoptysis.  No chest pain, palpitations, dizziness, diaphoresis or pitting edema of the lower extremities.  No dysuria, frequency or hematuria.  Denies polyuria, polydipsia, polyphagia or blurred vision.  ED Course: Initial vital signs temperature 97.7 F, pulse 80, respirations 18, blood pressure 127/102 mmHg and O2 sat 97% on room air.  The patient received a 1000 mL NS bolus, 80 mg of Protonix and 40 mEq of KCl in the emergency department.  She also received "a bag of fluids" during hemodialysis.   White count is 4.4, hemoglobin 11.2 g/dL and platelets 191.PT 25.0, INR 2.3 and APTT 39.  Sodium 135, potassium 2.5, chloride 93 and CO2 27 mmol/L.  Glucose 69, BUN 8, creatinine 2.64 and calcium 7.6 mg/dL. Stool guaiac was positive.   Hospital Course:  Principal Problem:   UGI bleed Active Problems:   Glaucoma   Essential hypertension, benign   ESRD (end stage renal disease) ([facility])   Anemia of renal disease   PAF (paroxysmal atrial fibrillation) ([facility])   Hypokalemia   Hyperlipidemia   GERD (gastroesophageal reflux disease)   Hypertension   Melena   Hiatal hernia   Gastroenteritis due to norovirus   Anticoagulated on Coumadin   Diarrhea/Melena/FOBT+ --hold coumadin held since admission, she received vitkx1 on 3/6 -s/p EGD, +large hiatal hernia, no overt bleed, GI oked to restart coumadin -She is tested + for norovirus -gi symptom resolved  PAF maintained sinus rhythm on amiodarone,  coumadin held  since admission, resumed on 3/7 after EGD per gi recommendation   Follow up with coumadin clinic  Hypokalemia: Likely due to vomiting and diarrhea k replaced   ESRD on HD TTS oliguria  Plan per nephrology    h/o bilateral kidney stones, h/o right sided nephrostomy tube in 2017, removed,  H/o pyelonephritis,  h/o recurrent uti On chronic abx keflex daily, on probiotics  H/o C diff  Reports diarrhea resolved, last bm was formed, no ab pain, no fever, no leukocytosis  Code Status: full  Family Communication: patient and daughter at bedside on 3/6  Disposition Plan: home    Consultants:  GI  nephrology  Procedures:  EGD on 3/7   Dialysis   Antibiotics:  Chronic keflex daily   Discharge Exam: BP (!) 118/54 (BP Location: Right Arm)   Pulse 73   Temp 97.9 F (36.6 C) (Oral)   Resp 18   Ht 5\' 3"  (1.6 m)   Wt 85.5 kg   LMP 01/19/1992 (Approximate)   SpO2 94%   BMI 33.39 kg/m   General: NAD Cardiovascular: RRR Respiratory: CTABL  Discharge Instructions You were cared for by a hospitalist during your hospital stay. If you have any questions about your discharge medications or the care you received while you were in the hospital after you are discharged, you can call the unit and asked to speak with the hospitalist on call if the hospitalist that took care of you is not available. Once you are discharged, your primary care physician will handle any further medical issues. Please note that NO REFILLS for any discharge medications will be authorized once you are discharged, as it is imperative that you return to your primary care physician (or establish a relationship with a primary care physician if you do not have one) for your aftercare needs so that they can reassess your need for medications and monitor your lab values.  Discharge Instructions    Diet - low sodium heart healthy   Complete by:  As directed    Renal diet   Increase activity slowly   Complete by:  As directed      Allergies as of 08/27/2018      Reactions   Vicodin [hydrocodone-acetaminophen] Nausea And Vomiting   Chlorhexidine Rash   Sunburn    rash   Percocet [oxycodone-acetaminophen] Nausea And Vomiting      Medication List    TAKE these medications   acetaminophen 500 MG  tablet Commonly known as:  TYLENOL Take 1,000 mg by mouth every 6 (six) hours as needed for moderate pain or headache.   amiodarone 200 MG tablet Commonly known as:  PACERONE TAKE ONE TABLET BY MOUTH DAILY *PLEASE KEEP UPCOMING APPOINTMENT IN NOVEMBER FOR FUTURE REFILLS* What changed:  See the new instructions.   bimatoprost 0.01 % Soln Commonly known as:  LUMIGAN Place 1 drop into both eyes at bedtime.   calcium acetate 667 MG capsule Commonly known as:  PHOSLO Take 1,334 mg by mouth 3 (three) times daily with meals.   cephALEXin 250 MG capsule Commonly known as:  KEFLEX Take 250 mg by mouth daily.   loperamide 2 MG tablet Commonly known as:  IMODIUM A-D Take 4-6 mg by mouth 4 (four) times daily as needed for diarrhea or loose stools.   multivitamin with minerals tablet Take 1 tablet by mouth daily.   ondansetron 4 MG tablet Commonly known as:  ZOFRAN Take 4 mg by mouth every 8 (eight) hours as needed for nausea  or vomiting.   saccharomyces boulardii 250 MG capsule Commonly known as:  FLORASTOR Take 1 capsule (250 mg total) by mouth 2 (two) times daily.   sodium chloride 0.65 % Soln nasal spray Commonly known as:  OCEAN Place 1 spray into both nostrils 3 (three) times daily as needed for congestion.   timolol 0.5 % ophthalmic solution Commonly known as:  BETIMOL Place 1 drop into both eyes every morning.   warfarin 5 MG tablet Commonly known as:  COUMADIN Take as directed. If you are unsure how to take this medication, talk to your nurse or doctor. Original instructions:  TAKE AS DIRECTED BY COUMADIN CLINIC What changed:  See the new instructions.      Allergies  Allergen Reactions  . Vicodin [Hydrocodone-Acetaminophen] Nausea And Vomiting  . Chlorhexidine Rash    Sunburn    rash  . Percocet [Oxycodone-Acetaminophen] Nausea And Vomiting   Follow-up Information    Stoneking, Hal, MD Follow up in 1 week(s).   Specialty:  Internal Medicine Why:  hospital  discharge follow up, repeat cbc/bmp at follow up. Contact information: [address] [place] [address] [phone]        f/u with GI regarding hiatal hernia and blood in stool. Follow up.        f/u with coumadin clinic tomorrow Follow up on 08/28/2018.   Why:  check INR tomorrow, coumadin dose adjustment per coumadin clinic       continue outpatient dialysis on your regular schedule Follow up.            The results of significant diagnostics from this hospitalization (including imaging, microbiology, ancillary and laboratory) are listed below for reference.    Significant Diagnostic Studies: No results found.  Microbiology: Recent Results (from the past 240 hour(s))  Gastrointestinal Panel by PCR , Stool     Status: Abnormal   Collection Time: 08/24/18  8:07 AM  Result Value Ref Range Status   Campylobacter species NOT DETECTED NOT DETECTED Final   Plesimonas shigelloides NOT DETECTED NOT DETECTED Final   Salmonella species NOT DETECTED NOT DETECTED Final   Yersinia enterocolitica NOT DETECTED NOT DETECTED Final   Vibrio species NOT DETECTED NOT DETECTED Final   Vibrio cholerae NOT DETECTED NOT DETECTED Final   Enteroaggregative E coli (EAEC) NOT DETECTED NOT DETECTED Final   Enteropathogenic E coli (EPEC) NOT DETECTED NOT DETECTED Final   Enterotoxigenic E coli (ETEC) NOT DETECTED NOT DETECTED Final   Shiga like toxin producing E coli (STEC) NOT DETECTED NOT DETECTED Final   Shigella/Enteroinvasive E coli (EIEC) NOT DETECTED NOT DETECTED Final   Cryptosporidium NOT DETECTED NOT DETECTED Final   Cyclospora cayetanensis NOT DETECTED NOT DETECTED Final   Entamoeba histolytica NOT DETECTED NOT DETECTED Final   Giardia lamblia NOT DETECTED NOT DETECTED Final   Adenovirus F40/41 NOT DETECTED NOT DETECTED Final   Astrovirus NOT DETECTED NOT DETECTED Final   Norovirus GI/GII DETECTED (A) NOT DETECTED Final    Comment: RESULT CALLED TO, READ BACK BY  AND VERIFIED WITH: TRANACE DAYE AT 1511 ON 08/25/2018 [place].    Rotavirus A NOT DETECTED NOT DETECTED Final   Sapovirus (I, II, IV, and V) NOT DETECTED NOT DETECTED Final    Comment: Performed at [facility], [address] [place], [place] [address]  MRSA PCR Screening     Status: None   Collection Time: 08/24/18 10:11 AM  Result Value Ref Range Status   MRSA by PCR NEGATIVE NEGATIVE  Final    Comment:        The GeneXpert MRSA Assay (FDA approved for NASAL specimens only), is one component of a comprehensive MRSA colonization surveillance program. It is not intended to diagnose MRSA infection nor to guide or monitor treatment for MRSA infections. Performed at [facility], [place] [address] [place], [place] [address]      Labs: Basic Metabolic Panel: Recent Labs  Lab 08/23/18 1616 08/24/18 0140 08/24/18 0948 08/25/18 0440 08/26/18 0825 08/27/18 0811  NA  --  134* 135 135 138 136  K  --  2.9* 3.2* 3.3* 3.5 3.8  CL  --  95* 97* 100 100 100  CO2  --  31 27 27 27 25   GLUCOSE  --  86 77 88 96 95  BUN  --  14 18 27* 36* 45*  CREATININE  --  3.83* 4.52* 5.88* 6.98* 7.81*  CALCIUM  --  7.7* 7.7* 8.3* 8.8* 8.3*  MG 1.9  --   --   --  2.1  --   PHOS  --   --   --   --   --  3.1   Liver Function Tests: Recent Labs  Lab 08/24/18 0948 08/25/18 0440 08/27/18 0811  AST 35 28  --   ALT 29 27  --   ALKPHOS 54 60  --   BILITOT 0.9 0.7  --   PROT 4.6* 5.1*  --   ALBUMIN 2.4* 2.6* 2.6*   No results for input(s): LIPASE, AMYLASE in the last 168 hours. No results for input(s): AMMONIA in the last 168 hours. CBC: Recent Labs  Lab 08/23/18 1605 08/24/18 0147 08/24/18 0948 08/24/18 2257 08/25/18 0440 08/25/18 1131 08/26/18 0825 08/27/18 0430  WBC 4.4 5.7 5.4  --  5.8  --  8.1 6.8  NEUTROABS 2.9  --  4.0  --  4.0  --  5.9 4.8  HGB 11.2* 10.4* 10.6* 10.0* 9.9* 11.3* 10.4* 9.2*  HCT 34.3* 31.8* 31.7* 30.4* 30.0* 34.7* 31.5* 28.9*  MCV 94.0 92.2 92.7  --   92.9  --  93.5 94.1  PLT 191 195 189  --  188  --  229 203   Cardiac Enzymes: No results for input(s): CKTOTAL, CKMB, CKMBINDEX, TROPONINI in the last 168 hours. BNP: BNP (last 3 results) No results for input(s): BNP in the last 8760 hours.  ProBNP (last 3 results) No results for input(s): PROBNP in the last 8760 hours.  CBG: No results for input(s): GLUCAP in the last 168 hours.     Signed:  Florencia Reasons MD, PhD  [facility] 08/27/2018, 12:00 PM

## 2018-08-28 DIAGNOSIS — I482 Chronic atrial fibrillation, unspecified: Secondary | ICD-10-CM | POA: Diagnosis not present

## 2018-08-28 DIAGNOSIS — N186 End stage renal disease: Secondary | ICD-10-CM | POA: Diagnosis not present

## 2018-08-28 DIAGNOSIS — N2581 Secondary hyperparathyroidism of renal origin: Secondary | ICD-10-CM | POA: Diagnosis not present

## 2018-08-28 DIAGNOSIS — Z5181 Encounter for therapeutic drug level monitoring: Secondary | ICD-10-CM | POA: Diagnosis not present

## 2018-08-28 DIAGNOSIS — D509 Iron deficiency anemia, unspecified: Secondary | ICD-10-CM | POA: Diagnosis not present

## 2018-08-28 DIAGNOSIS — D688 Other specified coagulation defects: Secondary | ICD-10-CM | POA: Diagnosis not present

## 2018-08-28 DIAGNOSIS — E039 Hypothyroidism, unspecified: Secondary | ICD-10-CM | POA: Diagnosis not present

## 2018-08-28 DIAGNOSIS — D631 Anemia in chronic kidney disease: Secondary | ICD-10-CM | POA: Diagnosis not present

## 2018-08-29 ENCOUNTER — Ambulatory Visit: Payer: Medicare Other | Admitting: *Deleted

## 2018-08-29 ENCOUNTER — Other Ambulatory Visit: Payer: Self-pay

## 2018-08-29 DIAGNOSIS — I480 Paroxysmal atrial fibrillation: Secondary | ICD-10-CM

## 2018-08-29 DIAGNOSIS — I4891 Unspecified atrial fibrillation: Secondary | ICD-10-CM

## 2018-08-29 DIAGNOSIS — Z5181 Encounter for therapeutic drug level monitoring: Secondary | ICD-10-CM | POA: Diagnosis not present

## 2018-08-29 LAB — POCT INR: INR: 2.7 (ref 2.0–3.0)

## 2018-08-29 NOTE — Patient Instructions (Addendum)
Description   Today take 1/2 tablet then continue taking 1/2 tablet daily except 1 tablet on Mondays, Wednesdays, and Fridays. Recheck INR on Monday (usually 6 weeks). Call with any new medications or if scheduled for any procedures or with any questions [phone]

## 2018-08-30 DIAGNOSIS — D631 Anemia in chronic kidney disease: Secondary | ICD-10-CM | POA: Diagnosis not present

## 2018-08-30 DIAGNOSIS — N186 End stage renal disease: Secondary | ICD-10-CM | POA: Diagnosis not present

## 2018-08-30 DIAGNOSIS — E039 Hypothyroidism, unspecified: Secondary | ICD-10-CM | POA: Diagnosis not present

## 2018-08-30 DIAGNOSIS — D509 Iron deficiency anemia, unspecified: Secondary | ICD-10-CM | POA: Diagnosis not present

## 2018-08-30 DIAGNOSIS — D688 Other specified coagulation defects: Secondary | ICD-10-CM | POA: Diagnosis not present

## 2018-08-30 DIAGNOSIS — Z5181 Encounter for therapeutic drug level monitoring: Secondary | ICD-10-CM | POA: Diagnosis not present

## 2018-08-30 DIAGNOSIS — I482 Chronic atrial fibrillation, unspecified: Secondary | ICD-10-CM | POA: Diagnosis not present

## 2018-08-30 DIAGNOSIS — N2581 Secondary hyperparathyroidism of renal origin: Secondary | ICD-10-CM | POA: Diagnosis not present

## 2018-09-01 DIAGNOSIS — E039 Hypothyroidism, unspecified: Secondary | ICD-10-CM | POA: Diagnosis not present

## 2018-09-01 DIAGNOSIS — D688 Other specified coagulation defects: Secondary | ICD-10-CM | POA: Diagnosis not present

## 2018-09-01 DIAGNOSIS — Z5181 Encounter for therapeutic drug level monitoring: Secondary | ICD-10-CM | POA: Diagnosis not present

## 2018-09-01 DIAGNOSIS — I482 Chronic atrial fibrillation, unspecified: Secondary | ICD-10-CM | POA: Diagnosis not present

## 2018-09-01 DIAGNOSIS — N2581 Secondary hyperparathyroidism of renal origin: Secondary | ICD-10-CM | POA: Diagnosis not present

## 2018-09-01 DIAGNOSIS — N186 End stage renal disease: Secondary | ICD-10-CM | POA: Diagnosis not present

## 2018-09-01 DIAGNOSIS — D631 Anemia in chronic kidney disease: Secondary | ICD-10-CM | POA: Diagnosis not present

## 2018-09-01 DIAGNOSIS — D509 Iron deficiency anemia, unspecified: Secondary | ICD-10-CM | POA: Diagnosis not present

## 2018-09-03 ENCOUNTER — Other Ambulatory Visit: Payer: Self-pay

## 2018-09-03 ENCOUNTER — Ambulatory Visit: Payer: Medicare Other | Admitting: Pharmacist

## 2018-09-03 DIAGNOSIS — Z5181 Encounter for therapeutic drug level monitoring: Secondary | ICD-10-CM | POA: Diagnosis not present

## 2018-09-03 DIAGNOSIS — I480 Paroxysmal atrial fibrillation: Secondary | ICD-10-CM

## 2018-09-03 DIAGNOSIS — I4891 Unspecified atrial fibrillation: Secondary | ICD-10-CM | POA: Diagnosis not present

## 2018-09-03 LAB — POCT INR: INR: 2.2 (ref 2.0–3.0)

## 2018-09-03 NOTE — Patient Instructions (Signed)
Description   Continue taking 1/2 tablet daily except 1 tablet on Mondays, Wednesdays, and Fridays. Recheck INR in 6 weeks. Call with any new medications or if scheduled for any procedures or with any questions [phone]

## 2018-09-04 DIAGNOSIS — E039 Hypothyroidism, unspecified: Secondary | ICD-10-CM | POA: Diagnosis not present

## 2018-09-04 DIAGNOSIS — Z5181 Encounter for therapeutic drug level monitoring: Secondary | ICD-10-CM | POA: Diagnosis not present

## 2018-09-04 DIAGNOSIS — I482 Chronic atrial fibrillation, unspecified: Secondary | ICD-10-CM | POA: Diagnosis not present

## 2018-09-04 DIAGNOSIS — N2581 Secondary hyperparathyroidism of renal origin: Secondary | ICD-10-CM | POA: Diagnosis not present

## 2018-09-04 DIAGNOSIS — N186 End stage renal disease: Secondary | ICD-10-CM | POA: Diagnosis not present

## 2018-09-04 DIAGNOSIS — D509 Iron deficiency anemia, unspecified: Secondary | ICD-10-CM | POA: Diagnosis not present

## 2018-09-04 DIAGNOSIS — D631 Anemia in chronic kidney disease: Secondary | ICD-10-CM | POA: Diagnosis not present

## 2018-09-04 DIAGNOSIS — D688 Other specified coagulation defects: Secondary | ICD-10-CM | POA: Diagnosis not present

## 2018-09-06 DIAGNOSIS — N186 End stage renal disease: Secondary | ICD-10-CM | POA: Diagnosis not present

## 2018-09-06 DIAGNOSIS — D631 Anemia in chronic kidney disease: Secondary | ICD-10-CM | POA: Diagnosis not present

## 2018-09-06 DIAGNOSIS — Z5181 Encounter for therapeutic drug level monitoring: Secondary | ICD-10-CM | POA: Diagnosis not present

## 2018-09-06 DIAGNOSIS — D688 Other specified coagulation defects: Secondary | ICD-10-CM | POA: Diagnosis not present

## 2018-09-06 DIAGNOSIS — I482 Chronic atrial fibrillation, unspecified: Secondary | ICD-10-CM | POA: Diagnosis not present

## 2018-09-06 DIAGNOSIS — D509 Iron deficiency anemia, unspecified: Secondary | ICD-10-CM | POA: Diagnosis not present

## 2018-09-06 DIAGNOSIS — N2581 Secondary hyperparathyroidism of renal origin: Secondary | ICD-10-CM | POA: Diagnosis not present

## 2018-09-06 DIAGNOSIS — E039 Hypothyroidism, unspecified: Secondary | ICD-10-CM | POA: Diagnosis not present

## 2018-09-08 DIAGNOSIS — I482 Chronic atrial fibrillation, unspecified: Secondary | ICD-10-CM | POA: Diagnosis not present

## 2018-09-08 DIAGNOSIS — D509 Iron deficiency anemia, unspecified: Secondary | ICD-10-CM | POA: Diagnosis not present

## 2018-09-08 DIAGNOSIS — D631 Anemia in chronic kidney disease: Secondary | ICD-10-CM | POA: Diagnosis not present

## 2018-09-08 DIAGNOSIS — D688 Other specified coagulation defects: Secondary | ICD-10-CM | POA: Diagnosis not present

## 2018-09-08 DIAGNOSIS — N186 End stage renal disease: Secondary | ICD-10-CM | POA: Diagnosis not present

## 2018-09-08 DIAGNOSIS — N2581 Secondary hyperparathyroidism of renal origin: Secondary | ICD-10-CM | POA: Diagnosis not present

## 2018-09-08 DIAGNOSIS — E039 Hypothyroidism, unspecified: Secondary | ICD-10-CM | POA: Diagnosis not present

## 2018-09-08 DIAGNOSIS — Z5181 Encounter for therapeutic drug level monitoring: Secondary | ICD-10-CM | POA: Diagnosis not present

## 2018-09-11 DIAGNOSIS — E039 Hypothyroidism, unspecified: Secondary | ICD-10-CM | POA: Diagnosis not present

## 2018-09-11 DIAGNOSIS — D509 Iron deficiency anemia, unspecified: Secondary | ICD-10-CM | POA: Diagnosis not present

## 2018-09-11 DIAGNOSIS — N2581 Secondary hyperparathyroidism of renal origin: Secondary | ICD-10-CM | POA: Diagnosis not present

## 2018-09-11 DIAGNOSIS — N186 End stage renal disease: Secondary | ICD-10-CM | POA: Diagnosis not present

## 2018-09-11 DIAGNOSIS — Z5181 Encounter for therapeutic drug level monitoring: Secondary | ICD-10-CM | POA: Diagnosis not present

## 2018-09-11 DIAGNOSIS — D688 Other specified coagulation defects: Secondary | ICD-10-CM | POA: Diagnosis not present

## 2018-09-11 DIAGNOSIS — D631 Anemia in chronic kidney disease: Secondary | ICD-10-CM | POA: Diagnosis not present

## 2018-09-11 DIAGNOSIS — I482 Chronic atrial fibrillation, unspecified: Secondary | ICD-10-CM | POA: Diagnosis not present

## 2018-09-13 DIAGNOSIS — N2581 Secondary hyperparathyroidism of renal origin: Secondary | ICD-10-CM | POA: Diagnosis not present

## 2018-09-13 DIAGNOSIS — D631 Anemia in chronic kidney disease: Secondary | ICD-10-CM | POA: Diagnosis not present

## 2018-09-13 DIAGNOSIS — N186 End stage renal disease: Secondary | ICD-10-CM | POA: Diagnosis not present

## 2018-09-13 DIAGNOSIS — Z5181 Encounter for therapeutic drug level monitoring: Secondary | ICD-10-CM | POA: Diagnosis not present

## 2018-09-13 DIAGNOSIS — D688 Other specified coagulation defects: Secondary | ICD-10-CM | POA: Diagnosis not present

## 2018-09-13 DIAGNOSIS — D509 Iron deficiency anemia, unspecified: Secondary | ICD-10-CM | POA: Diagnosis not present

## 2018-09-13 DIAGNOSIS — I482 Chronic atrial fibrillation, unspecified: Secondary | ICD-10-CM | POA: Diagnosis not present

## 2018-09-13 DIAGNOSIS — E039 Hypothyroidism, unspecified: Secondary | ICD-10-CM | POA: Diagnosis not present

## 2018-09-15 DIAGNOSIS — E039 Hypothyroidism, unspecified: Secondary | ICD-10-CM | POA: Diagnosis not present

## 2018-09-15 DIAGNOSIS — N186 End stage renal disease: Secondary | ICD-10-CM | POA: Diagnosis not present

## 2018-09-15 DIAGNOSIS — I482 Chronic atrial fibrillation, unspecified: Secondary | ICD-10-CM | POA: Diagnosis not present

## 2018-09-15 DIAGNOSIS — D509 Iron deficiency anemia, unspecified: Secondary | ICD-10-CM | POA: Diagnosis not present

## 2018-09-15 DIAGNOSIS — D631 Anemia in chronic kidney disease: Secondary | ICD-10-CM | POA: Diagnosis not present

## 2018-09-15 DIAGNOSIS — D688 Other specified coagulation defects: Secondary | ICD-10-CM | POA: Diagnosis not present

## 2018-09-15 DIAGNOSIS — N2581 Secondary hyperparathyroidism of renal origin: Secondary | ICD-10-CM | POA: Diagnosis not present

## 2018-09-15 DIAGNOSIS — Z5181 Encounter for therapeutic drug level monitoring: Secondary | ICD-10-CM | POA: Diagnosis not present

## 2018-09-18 DIAGNOSIS — Z992 Dependence on renal dialysis: Secondary | ICD-10-CM | POA: Diagnosis not present

## 2018-09-18 DIAGNOSIS — N186 End stage renal disease: Secondary | ICD-10-CM | POA: Diagnosis not present

## 2018-09-18 DIAGNOSIS — D688 Other specified coagulation defects: Secondary | ICD-10-CM | POA: Diagnosis not present

## 2018-09-18 DIAGNOSIS — D631 Anemia in chronic kidney disease: Secondary | ICD-10-CM | POA: Diagnosis not present

## 2018-09-18 DIAGNOSIS — I129 Hypertensive chronic kidney disease with stage 1 through stage 4 chronic kidney disease, or unspecified chronic kidney disease: Secondary | ICD-10-CM | POA: Diagnosis not present

## 2018-09-18 DIAGNOSIS — E039 Hypothyroidism, unspecified: Secondary | ICD-10-CM | POA: Diagnosis not present

## 2018-09-18 DIAGNOSIS — D509 Iron deficiency anemia, unspecified: Secondary | ICD-10-CM | POA: Diagnosis not present

## 2018-09-18 DIAGNOSIS — N2581 Secondary hyperparathyroidism of renal origin: Secondary | ICD-10-CM | POA: Diagnosis not present

## 2018-09-18 DIAGNOSIS — Z5181 Encounter for therapeutic drug level monitoring: Secondary | ICD-10-CM | POA: Diagnosis not present

## 2018-09-18 DIAGNOSIS — I482 Chronic atrial fibrillation, unspecified: Secondary | ICD-10-CM | POA: Diagnosis not present

## 2018-09-20 DIAGNOSIS — D631 Anemia in chronic kidney disease: Secondary | ICD-10-CM | POA: Diagnosis not present

## 2018-09-20 DIAGNOSIS — Z5181 Encounter for therapeutic drug level monitoring: Secondary | ICD-10-CM | POA: Diagnosis not present

## 2018-09-20 DIAGNOSIS — R51 Headache: Secondary | ICD-10-CM | POA: Diagnosis not present

## 2018-09-20 DIAGNOSIS — N186 End stage renal disease: Secondary | ICD-10-CM | POA: Diagnosis not present

## 2018-09-20 DIAGNOSIS — I482 Chronic atrial fibrillation, unspecified: Secondary | ICD-10-CM | POA: Diagnosis not present

## 2018-09-20 DIAGNOSIS — E039 Hypothyroidism, unspecified: Secondary | ICD-10-CM | POA: Diagnosis not present

## 2018-09-20 DIAGNOSIS — D688 Other specified coagulation defects: Secondary | ICD-10-CM | POA: Diagnosis not present

## 2018-09-20 DIAGNOSIS — N2581 Secondary hyperparathyroidism of renal origin: Secondary | ICD-10-CM | POA: Diagnosis not present

## 2018-09-20 DIAGNOSIS — D509 Iron deficiency anemia, unspecified: Secondary | ICD-10-CM | POA: Diagnosis not present

## 2018-09-22 DIAGNOSIS — E039 Hypothyroidism, unspecified: Secondary | ICD-10-CM | POA: Diagnosis not present

## 2018-09-22 DIAGNOSIS — D688 Other specified coagulation defects: Secondary | ICD-10-CM | POA: Diagnosis not present

## 2018-09-22 DIAGNOSIS — D631 Anemia in chronic kidney disease: Secondary | ICD-10-CM | POA: Diagnosis not present

## 2018-09-22 DIAGNOSIS — N2581 Secondary hyperparathyroidism of renal origin: Secondary | ICD-10-CM | POA: Diagnosis not present

## 2018-09-22 DIAGNOSIS — I482 Chronic atrial fibrillation, unspecified: Secondary | ICD-10-CM | POA: Diagnosis not present

## 2018-09-22 DIAGNOSIS — R51 Headache: Secondary | ICD-10-CM | POA: Diagnosis not present

## 2018-09-22 DIAGNOSIS — N186 End stage renal disease: Secondary | ICD-10-CM | POA: Diagnosis not present

## 2018-09-22 DIAGNOSIS — D509 Iron deficiency anemia, unspecified: Secondary | ICD-10-CM | POA: Diagnosis not present

## 2018-09-22 DIAGNOSIS — Z5181 Encounter for therapeutic drug level monitoring: Secondary | ICD-10-CM | POA: Diagnosis not present

## 2018-09-25 DIAGNOSIS — I482 Chronic atrial fibrillation, unspecified: Secondary | ICD-10-CM | POA: Diagnosis not present

## 2018-09-25 DIAGNOSIS — D631 Anemia in chronic kidney disease: Secondary | ICD-10-CM | POA: Diagnosis not present

## 2018-09-25 DIAGNOSIS — D688 Other specified coagulation defects: Secondary | ICD-10-CM | POA: Diagnosis not present

## 2018-09-25 DIAGNOSIS — R51 Headache: Secondary | ICD-10-CM | POA: Diagnosis not present

## 2018-09-25 DIAGNOSIS — D509 Iron deficiency anemia, unspecified: Secondary | ICD-10-CM | POA: Diagnosis not present

## 2018-09-25 DIAGNOSIS — E039 Hypothyroidism, unspecified: Secondary | ICD-10-CM | POA: Diagnosis not present

## 2018-09-25 DIAGNOSIS — Z5181 Encounter for therapeutic drug level monitoring: Secondary | ICD-10-CM | POA: Diagnosis not present

## 2018-09-25 DIAGNOSIS — N186 End stage renal disease: Secondary | ICD-10-CM | POA: Diagnosis not present

## 2018-09-25 DIAGNOSIS — N2581 Secondary hyperparathyroidism of renal origin: Secondary | ICD-10-CM | POA: Diagnosis not present

## 2018-09-27 DIAGNOSIS — N2581 Secondary hyperparathyroidism of renal origin: Secondary | ICD-10-CM | POA: Diagnosis not present

## 2018-09-27 DIAGNOSIS — D631 Anemia in chronic kidney disease: Secondary | ICD-10-CM | POA: Diagnosis not present

## 2018-09-27 DIAGNOSIS — Z5181 Encounter for therapeutic drug level monitoring: Secondary | ICD-10-CM | POA: Diagnosis not present

## 2018-09-27 DIAGNOSIS — R51 Headache: Secondary | ICD-10-CM | POA: Diagnosis not present

## 2018-09-27 DIAGNOSIS — N186 End stage renal disease: Secondary | ICD-10-CM | POA: Diagnosis not present

## 2018-09-27 DIAGNOSIS — D688 Other specified coagulation defects: Secondary | ICD-10-CM | POA: Diagnosis not present

## 2018-09-27 DIAGNOSIS — E039 Hypothyroidism, unspecified: Secondary | ICD-10-CM | POA: Diagnosis not present

## 2018-09-27 DIAGNOSIS — D509 Iron deficiency anemia, unspecified: Secondary | ICD-10-CM | POA: Diagnosis not present

## 2018-09-27 DIAGNOSIS — I482 Chronic atrial fibrillation, unspecified: Secondary | ICD-10-CM | POA: Diagnosis not present

## 2018-09-29 DIAGNOSIS — I482 Chronic atrial fibrillation, unspecified: Secondary | ICD-10-CM | POA: Diagnosis not present

## 2018-09-29 DIAGNOSIS — Z5181 Encounter for therapeutic drug level monitoring: Secondary | ICD-10-CM | POA: Diagnosis not present

## 2018-09-29 DIAGNOSIS — D688 Other specified coagulation defects: Secondary | ICD-10-CM | POA: Diagnosis not present

## 2018-09-29 DIAGNOSIS — N2581 Secondary hyperparathyroidism of renal origin: Secondary | ICD-10-CM | POA: Diagnosis not present

## 2018-09-29 DIAGNOSIS — D509 Iron deficiency anemia, unspecified: Secondary | ICD-10-CM | POA: Diagnosis not present

## 2018-09-29 DIAGNOSIS — D631 Anemia in chronic kidney disease: Secondary | ICD-10-CM | POA: Diagnosis not present

## 2018-09-29 DIAGNOSIS — N186 End stage renal disease: Secondary | ICD-10-CM | POA: Diagnosis not present

## 2018-09-29 DIAGNOSIS — R51 Headache: Secondary | ICD-10-CM | POA: Diagnosis not present

## 2018-09-29 DIAGNOSIS — E039 Hypothyroidism, unspecified: Secondary | ICD-10-CM | POA: Diagnosis not present

## 2018-10-02 DIAGNOSIS — N2581 Secondary hyperparathyroidism of renal origin: Secondary | ICD-10-CM | POA: Diagnosis not present

## 2018-10-02 DIAGNOSIS — I482 Chronic atrial fibrillation, unspecified: Secondary | ICD-10-CM | POA: Diagnosis not present

## 2018-10-02 DIAGNOSIS — E039 Hypothyroidism, unspecified: Secondary | ICD-10-CM | POA: Diagnosis not present

## 2018-10-02 DIAGNOSIS — D631 Anemia in chronic kidney disease: Secondary | ICD-10-CM | POA: Diagnosis not present

## 2018-10-02 DIAGNOSIS — R51 Headache: Secondary | ICD-10-CM | POA: Diagnosis not present

## 2018-10-02 DIAGNOSIS — Z5181 Encounter for therapeutic drug level monitoring: Secondary | ICD-10-CM | POA: Diagnosis not present

## 2018-10-02 DIAGNOSIS — N186 End stage renal disease: Secondary | ICD-10-CM | POA: Diagnosis not present

## 2018-10-02 DIAGNOSIS — D509 Iron deficiency anemia, unspecified: Secondary | ICD-10-CM | POA: Diagnosis not present

## 2018-10-02 DIAGNOSIS — D688 Other specified coagulation defects: Secondary | ICD-10-CM | POA: Diagnosis not present

## 2018-10-04 DIAGNOSIS — N186 End stage renal disease: Secondary | ICD-10-CM | POA: Diagnosis not present

## 2018-10-04 DIAGNOSIS — D509 Iron deficiency anemia, unspecified: Secondary | ICD-10-CM | POA: Diagnosis not present

## 2018-10-04 DIAGNOSIS — D631 Anemia in chronic kidney disease: Secondary | ICD-10-CM | POA: Diagnosis not present

## 2018-10-04 DIAGNOSIS — D688 Other specified coagulation defects: Secondary | ICD-10-CM | POA: Diagnosis not present

## 2018-10-04 DIAGNOSIS — Z5181 Encounter for therapeutic drug level monitoring: Secondary | ICD-10-CM | POA: Diagnosis not present

## 2018-10-04 DIAGNOSIS — R51 Headache: Secondary | ICD-10-CM | POA: Diagnosis not present

## 2018-10-04 DIAGNOSIS — E039 Hypothyroidism, unspecified: Secondary | ICD-10-CM | POA: Diagnosis not present

## 2018-10-04 DIAGNOSIS — I482 Chronic atrial fibrillation, unspecified: Secondary | ICD-10-CM | POA: Diagnosis not present

## 2018-10-04 DIAGNOSIS — N2581 Secondary hyperparathyroidism of renal origin: Secondary | ICD-10-CM | POA: Diagnosis not present

## 2018-10-06 DIAGNOSIS — N186 End stage renal disease: Secondary | ICD-10-CM | POA: Diagnosis not present

## 2018-10-06 DIAGNOSIS — N2581 Secondary hyperparathyroidism of renal origin: Secondary | ICD-10-CM | POA: Diagnosis not present

## 2018-10-06 DIAGNOSIS — D509 Iron deficiency anemia, unspecified: Secondary | ICD-10-CM | POA: Diagnosis not present

## 2018-10-06 DIAGNOSIS — D688 Other specified coagulation defects: Secondary | ICD-10-CM | POA: Diagnosis not present

## 2018-10-06 DIAGNOSIS — Z5181 Encounter for therapeutic drug level monitoring: Secondary | ICD-10-CM | POA: Diagnosis not present

## 2018-10-06 DIAGNOSIS — I482 Chronic atrial fibrillation, unspecified: Secondary | ICD-10-CM | POA: Diagnosis not present

## 2018-10-06 DIAGNOSIS — E039 Hypothyroidism, unspecified: Secondary | ICD-10-CM | POA: Diagnosis not present

## 2018-10-06 DIAGNOSIS — D631 Anemia in chronic kidney disease: Secondary | ICD-10-CM | POA: Diagnosis not present

## 2018-10-06 DIAGNOSIS — R51 Headache: Secondary | ICD-10-CM | POA: Diagnosis not present

## 2018-10-09 DIAGNOSIS — E039 Hypothyroidism, unspecified: Secondary | ICD-10-CM | POA: Diagnosis not present

## 2018-10-09 DIAGNOSIS — Z5181 Encounter for therapeutic drug level monitoring: Secondary | ICD-10-CM | POA: Diagnosis not present

## 2018-10-09 DIAGNOSIS — D688 Other specified coagulation defects: Secondary | ICD-10-CM | POA: Diagnosis not present

## 2018-10-09 DIAGNOSIS — R51 Headache: Secondary | ICD-10-CM | POA: Diagnosis not present

## 2018-10-09 DIAGNOSIS — D509 Iron deficiency anemia, unspecified: Secondary | ICD-10-CM | POA: Diagnosis not present

## 2018-10-09 DIAGNOSIS — N186 End stage renal disease: Secondary | ICD-10-CM | POA: Diagnosis not present

## 2018-10-09 DIAGNOSIS — N2581 Secondary hyperparathyroidism of renal origin: Secondary | ICD-10-CM | POA: Diagnosis not present

## 2018-10-09 DIAGNOSIS — I482 Chronic atrial fibrillation, unspecified: Secondary | ICD-10-CM | POA: Diagnosis not present

## 2018-10-09 DIAGNOSIS — D631 Anemia in chronic kidney disease: Secondary | ICD-10-CM | POA: Diagnosis not present

## 2018-10-10 DIAGNOSIS — I129 Hypertensive chronic kidney disease with stage 1 through stage 4 chronic kidney disease, or unspecified chronic kidney disease: Secondary | ICD-10-CM | POA: Diagnosis not present

## 2018-10-10 DIAGNOSIS — K922 Gastrointestinal hemorrhage, unspecified: Secondary | ICD-10-CM | POA: Diagnosis not present

## 2018-10-10 DIAGNOSIS — N186 End stage renal disease: Secondary | ICD-10-CM | POA: Diagnosis not present

## 2018-10-11 DIAGNOSIS — N186 End stage renal disease: Secondary | ICD-10-CM | POA: Diagnosis not present

## 2018-10-11 DIAGNOSIS — I482 Chronic atrial fibrillation, unspecified: Secondary | ICD-10-CM | POA: Diagnosis not present

## 2018-10-11 DIAGNOSIS — D631 Anemia in chronic kidney disease: Secondary | ICD-10-CM | POA: Diagnosis not present

## 2018-10-11 DIAGNOSIS — D509 Iron deficiency anemia, unspecified: Secondary | ICD-10-CM | POA: Diagnosis not present

## 2018-10-11 DIAGNOSIS — E039 Hypothyroidism, unspecified: Secondary | ICD-10-CM | POA: Diagnosis not present

## 2018-10-11 DIAGNOSIS — R51 Headache: Secondary | ICD-10-CM | POA: Diagnosis not present

## 2018-10-11 DIAGNOSIS — Z5181 Encounter for therapeutic drug level monitoring: Secondary | ICD-10-CM | POA: Diagnosis not present

## 2018-10-11 DIAGNOSIS — D688 Other specified coagulation defects: Secondary | ICD-10-CM | POA: Diagnosis not present

## 2018-10-11 DIAGNOSIS — N2581 Secondary hyperparathyroidism of renal origin: Secondary | ICD-10-CM | POA: Diagnosis not present

## 2018-10-12 ENCOUNTER — Telehealth: Payer: Self-pay

## 2018-10-12 NOTE — Telephone Encounter (Signed)

## 2018-10-13 DIAGNOSIS — D688 Other specified coagulation defects: Secondary | ICD-10-CM | POA: Diagnosis not present

## 2018-10-13 DIAGNOSIS — N2581 Secondary hyperparathyroidism of renal origin: Secondary | ICD-10-CM | POA: Diagnosis not present

## 2018-10-13 DIAGNOSIS — R51 Headache: Secondary | ICD-10-CM | POA: Diagnosis not present

## 2018-10-13 DIAGNOSIS — N186 End stage renal disease: Secondary | ICD-10-CM | POA: Diagnosis not present

## 2018-10-13 DIAGNOSIS — E039 Hypothyroidism, unspecified: Secondary | ICD-10-CM | POA: Diagnosis not present

## 2018-10-13 DIAGNOSIS — Z5181 Encounter for therapeutic drug level monitoring: Secondary | ICD-10-CM | POA: Diagnosis not present

## 2018-10-13 DIAGNOSIS — I482 Chronic atrial fibrillation, unspecified: Secondary | ICD-10-CM | POA: Diagnosis not present

## 2018-10-13 DIAGNOSIS — D631 Anemia in chronic kidney disease: Secondary | ICD-10-CM | POA: Diagnosis not present

## 2018-10-13 DIAGNOSIS — D509 Iron deficiency anemia, unspecified: Secondary | ICD-10-CM | POA: Diagnosis not present

## 2018-10-15 ENCOUNTER — Ambulatory Visit (INDEPENDENT_AMBULATORY_CARE_PROVIDER_SITE_OTHER): Payer: Medicare Other | Admitting: *Deleted

## 2018-10-15 ENCOUNTER — Other Ambulatory Visit: Payer: Self-pay

## 2018-10-15 DIAGNOSIS — I4891 Unspecified atrial fibrillation: Secondary | ICD-10-CM | POA: Diagnosis not present

## 2018-10-15 DIAGNOSIS — Z5181 Encounter for therapeutic drug level monitoring: Secondary | ICD-10-CM | POA: Diagnosis not present

## 2018-10-15 DIAGNOSIS — I480 Paroxysmal atrial fibrillation: Secondary | ICD-10-CM | POA: Diagnosis not present

## 2018-10-15 LAB — POCT INR: INR: 2 (ref 2.0–3.0)

## 2018-10-16 DIAGNOSIS — I482 Chronic atrial fibrillation, unspecified: Secondary | ICD-10-CM | POA: Diagnosis not present

## 2018-10-16 DIAGNOSIS — D631 Anemia in chronic kidney disease: Secondary | ICD-10-CM | POA: Diagnosis not present

## 2018-10-16 DIAGNOSIS — D509 Iron deficiency anemia, unspecified: Secondary | ICD-10-CM | POA: Diagnosis not present

## 2018-10-16 DIAGNOSIS — N186 End stage renal disease: Secondary | ICD-10-CM | POA: Diagnosis not present

## 2018-10-16 DIAGNOSIS — Z5181 Encounter for therapeutic drug level monitoring: Secondary | ICD-10-CM | POA: Diagnosis not present

## 2018-10-16 DIAGNOSIS — E039 Hypothyroidism, unspecified: Secondary | ICD-10-CM | POA: Diagnosis not present

## 2018-10-16 DIAGNOSIS — D688 Other specified coagulation defects: Secondary | ICD-10-CM | POA: Diagnosis not present

## 2018-10-16 DIAGNOSIS — R51 Headache: Secondary | ICD-10-CM | POA: Diagnosis not present

## 2018-10-16 DIAGNOSIS — N2581 Secondary hyperparathyroidism of renal origin: Secondary | ICD-10-CM | POA: Diagnosis not present

## 2018-10-18 DIAGNOSIS — R51 Headache: Secondary | ICD-10-CM | POA: Diagnosis not present

## 2018-10-18 DIAGNOSIS — I129 Hypertensive chronic kidney disease with stage 1 through stage 4 chronic kidney disease, or unspecified chronic kidney disease: Secondary | ICD-10-CM | POA: Diagnosis not present

## 2018-10-18 DIAGNOSIS — D631 Anemia in chronic kidney disease: Secondary | ICD-10-CM | POA: Diagnosis not present

## 2018-10-18 DIAGNOSIS — Z992 Dependence on renal dialysis: Secondary | ICD-10-CM | POA: Diagnosis not present

## 2018-10-18 DIAGNOSIS — Z5181 Encounter for therapeutic drug level monitoring: Secondary | ICD-10-CM | POA: Diagnosis not present

## 2018-10-18 DIAGNOSIS — N2581 Secondary hyperparathyroidism of renal origin: Secondary | ICD-10-CM | POA: Diagnosis not present

## 2018-10-18 DIAGNOSIS — D509 Iron deficiency anemia, unspecified: Secondary | ICD-10-CM | POA: Diagnosis not present

## 2018-10-18 DIAGNOSIS — E039 Hypothyroidism, unspecified: Secondary | ICD-10-CM | POA: Diagnosis not present

## 2018-10-18 DIAGNOSIS — D688 Other specified coagulation defects: Secondary | ICD-10-CM | POA: Diagnosis not present

## 2018-10-18 DIAGNOSIS — I482 Chronic atrial fibrillation, unspecified: Secondary | ICD-10-CM | POA: Diagnosis not present

## 2018-10-18 DIAGNOSIS — N186 End stage renal disease: Secondary | ICD-10-CM | POA: Diagnosis not present

## 2018-10-20 DIAGNOSIS — N2581 Secondary hyperparathyroidism of renal origin: Secondary | ICD-10-CM | POA: Diagnosis not present

## 2018-10-20 DIAGNOSIS — I482 Chronic atrial fibrillation, unspecified: Secondary | ICD-10-CM | POA: Diagnosis not present

## 2018-10-20 DIAGNOSIS — D631 Anemia in chronic kidney disease: Secondary | ICD-10-CM | POA: Diagnosis not present

## 2018-10-20 DIAGNOSIS — Z5181 Encounter for therapeutic drug level monitoring: Secondary | ICD-10-CM | POA: Diagnosis not present

## 2018-10-20 DIAGNOSIS — N186 End stage renal disease: Secondary | ICD-10-CM | POA: Diagnosis not present

## 2018-10-20 DIAGNOSIS — D688 Other specified coagulation defects: Secondary | ICD-10-CM | POA: Diagnosis not present

## 2018-10-20 DIAGNOSIS — E039 Hypothyroidism, unspecified: Secondary | ICD-10-CM | POA: Diagnosis not present

## 2018-10-20 DIAGNOSIS — D509 Iron deficiency anemia, unspecified: Secondary | ICD-10-CM | POA: Diagnosis not present

## 2018-10-23 DIAGNOSIS — D688 Other specified coagulation defects: Secondary | ICD-10-CM | POA: Diagnosis not present

## 2018-10-23 DIAGNOSIS — D631 Anemia in chronic kidney disease: Secondary | ICD-10-CM | POA: Diagnosis not present

## 2018-10-23 DIAGNOSIS — D509 Iron deficiency anemia, unspecified: Secondary | ICD-10-CM | POA: Diagnosis not present

## 2018-10-23 DIAGNOSIS — N2581 Secondary hyperparathyroidism of renal origin: Secondary | ICD-10-CM | POA: Diagnosis not present

## 2018-10-23 DIAGNOSIS — Z5181 Encounter for therapeutic drug level monitoring: Secondary | ICD-10-CM | POA: Diagnosis not present

## 2018-10-23 DIAGNOSIS — N186 End stage renal disease: Secondary | ICD-10-CM | POA: Diagnosis not present

## 2018-10-23 DIAGNOSIS — E039 Hypothyroidism, unspecified: Secondary | ICD-10-CM | POA: Diagnosis not present

## 2018-10-23 DIAGNOSIS — I482 Chronic atrial fibrillation, unspecified: Secondary | ICD-10-CM | POA: Diagnosis not present

## 2018-10-25 DIAGNOSIS — D688 Other specified coagulation defects: Secondary | ICD-10-CM | POA: Diagnosis not present

## 2018-10-25 DIAGNOSIS — D631 Anemia in chronic kidney disease: Secondary | ICD-10-CM | POA: Diagnosis not present

## 2018-10-25 DIAGNOSIS — E039 Hypothyroidism, unspecified: Secondary | ICD-10-CM | POA: Diagnosis not present

## 2018-10-25 DIAGNOSIS — N2581 Secondary hyperparathyroidism of renal origin: Secondary | ICD-10-CM | POA: Diagnosis not present

## 2018-10-25 DIAGNOSIS — Z5181 Encounter for therapeutic drug level monitoring: Secondary | ICD-10-CM | POA: Diagnosis not present

## 2018-10-25 DIAGNOSIS — D509 Iron deficiency anemia, unspecified: Secondary | ICD-10-CM | POA: Diagnosis not present

## 2018-10-25 DIAGNOSIS — I482 Chronic atrial fibrillation, unspecified: Secondary | ICD-10-CM | POA: Diagnosis not present

## 2018-10-25 DIAGNOSIS — N186 End stage renal disease: Secondary | ICD-10-CM | POA: Diagnosis not present

## 2018-10-27 DIAGNOSIS — D631 Anemia in chronic kidney disease: Secondary | ICD-10-CM | POA: Diagnosis not present

## 2018-10-27 DIAGNOSIS — D688 Other specified coagulation defects: Secondary | ICD-10-CM | POA: Diagnosis not present

## 2018-10-27 DIAGNOSIS — Z5181 Encounter for therapeutic drug level monitoring: Secondary | ICD-10-CM | POA: Diagnosis not present

## 2018-10-27 DIAGNOSIS — N2581 Secondary hyperparathyroidism of renal origin: Secondary | ICD-10-CM | POA: Diagnosis not present

## 2018-10-27 DIAGNOSIS — D509 Iron deficiency anemia, unspecified: Secondary | ICD-10-CM | POA: Diagnosis not present

## 2018-10-27 DIAGNOSIS — E039 Hypothyroidism, unspecified: Secondary | ICD-10-CM | POA: Diagnosis not present

## 2018-10-27 DIAGNOSIS — N186 End stage renal disease: Secondary | ICD-10-CM | POA: Diagnosis not present

## 2018-10-27 DIAGNOSIS — I482 Chronic atrial fibrillation, unspecified: Secondary | ICD-10-CM | POA: Diagnosis not present

## 2018-10-30 DIAGNOSIS — N186 End stage renal disease: Secondary | ICD-10-CM | POA: Diagnosis not present

## 2018-10-30 DIAGNOSIS — D688 Other specified coagulation defects: Secondary | ICD-10-CM | POA: Diagnosis not present

## 2018-10-30 DIAGNOSIS — D509 Iron deficiency anemia, unspecified: Secondary | ICD-10-CM | POA: Diagnosis not present

## 2018-10-30 DIAGNOSIS — Z5181 Encounter for therapeutic drug level monitoring: Secondary | ICD-10-CM | POA: Diagnosis not present

## 2018-10-30 DIAGNOSIS — N2581 Secondary hyperparathyroidism of renal origin: Secondary | ICD-10-CM | POA: Diagnosis not present

## 2018-10-30 DIAGNOSIS — E039 Hypothyroidism, unspecified: Secondary | ICD-10-CM | POA: Diagnosis not present

## 2018-10-30 DIAGNOSIS — I482 Chronic atrial fibrillation, unspecified: Secondary | ICD-10-CM | POA: Diagnosis not present

## 2018-10-30 DIAGNOSIS — D631 Anemia in chronic kidney disease: Secondary | ICD-10-CM | POA: Diagnosis not present

## 2018-10-31 DIAGNOSIS — K222 Esophageal obstruction: Secondary | ICD-10-CM | POA: Diagnosis not present

## 2018-10-31 DIAGNOSIS — R1310 Dysphagia, unspecified: Secondary | ICD-10-CM | POA: Diagnosis not present

## 2018-10-31 DIAGNOSIS — K449 Diaphragmatic hernia without obstruction or gangrene: Secondary | ICD-10-CM | POA: Diagnosis not present

## 2018-10-31 DIAGNOSIS — R195 Other fecal abnormalities: Secondary | ICD-10-CM | POA: Diagnosis not present

## 2018-11-01 DIAGNOSIS — N186 End stage renal disease: Secondary | ICD-10-CM | POA: Diagnosis not present

## 2018-11-01 DIAGNOSIS — Z5181 Encounter for therapeutic drug level monitoring: Secondary | ICD-10-CM | POA: Diagnosis not present

## 2018-11-01 DIAGNOSIS — D688 Other specified coagulation defects: Secondary | ICD-10-CM | POA: Diagnosis not present

## 2018-11-01 DIAGNOSIS — I482 Chronic atrial fibrillation, unspecified: Secondary | ICD-10-CM | POA: Diagnosis not present

## 2018-11-01 DIAGNOSIS — D509 Iron deficiency anemia, unspecified: Secondary | ICD-10-CM | POA: Diagnosis not present

## 2018-11-01 DIAGNOSIS — E039 Hypothyroidism, unspecified: Secondary | ICD-10-CM | POA: Diagnosis not present

## 2018-11-01 DIAGNOSIS — N2581 Secondary hyperparathyroidism of renal origin: Secondary | ICD-10-CM | POA: Diagnosis not present

## 2018-11-01 DIAGNOSIS — D631 Anemia in chronic kidney disease: Secondary | ICD-10-CM | POA: Diagnosis not present

## 2018-11-03 DIAGNOSIS — D631 Anemia in chronic kidney disease: Secondary | ICD-10-CM | POA: Diagnosis not present

## 2018-11-03 DIAGNOSIS — N2581 Secondary hyperparathyroidism of renal origin: Secondary | ICD-10-CM | POA: Diagnosis not present

## 2018-11-03 DIAGNOSIS — D688 Other specified coagulation defects: Secondary | ICD-10-CM | POA: Diagnosis not present

## 2018-11-03 DIAGNOSIS — N186 End stage renal disease: Secondary | ICD-10-CM | POA: Diagnosis not present

## 2018-11-03 DIAGNOSIS — I482 Chronic atrial fibrillation, unspecified: Secondary | ICD-10-CM | POA: Diagnosis not present

## 2018-11-03 DIAGNOSIS — E039 Hypothyroidism, unspecified: Secondary | ICD-10-CM | POA: Diagnosis not present

## 2018-11-03 DIAGNOSIS — Z5181 Encounter for therapeutic drug level monitoring: Secondary | ICD-10-CM | POA: Diagnosis not present

## 2018-11-03 DIAGNOSIS — D509 Iron deficiency anemia, unspecified: Secondary | ICD-10-CM | POA: Diagnosis not present

## 2018-11-06 DIAGNOSIS — D688 Other specified coagulation defects: Secondary | ICD-10-CM | POA: Diagnosis not present

## 2018-11-06 DIAGNOSIS — N2581 Secondary hyperparathyroidism of renal origin: Secondary | ICD-10-CM | POA: Diagnosis not present

## 2018-11-06 DIAGNOSIS — E039 Hypothyroidism, unspecified: Secondary | ICD-10-CM | POA: Diagnosis not present

## 2018-11-06 DIAGNOSIS — N186 End stage renal disease: Secondary | ICD-10-CM | POA: Diagnosis not present

## 2018-11-06 DIAGNOSIS — Z5181 Encounter for therapeutic drug level monitoring: Secondary | ICD-10-CM | POA: Diagnosis not present

## 2018-11-06 DIAGNOSIS — D631 Anemia in chronic kidney disease: Secondary | ICD-10-CM | POA: Diagnosis not present

## 2018-11-06 DIAGNOSIS — D509 Iron deficiency anemia, unspecified: Secondary | ICD-10-CM | POA: Diagnosis not present

## 2018-11-06 DIAGNOSIS — I482 Chronic atrial fibrillation, unspecified: Secondary | ICD-10-CM | POA: Diagnosis not present

## 2018-11-08 DIAGNOSIS — D688 Other specified coagulation defects: Secondary | ICD-10-CM | POA: Diagnosis not present

## 2018-11-08 DIAGNOSIS — I482 Chronic atrial fibrillation, unspecified: Secondary | ICD-10-CM | POA: Diagnosis not present

## 2018-11-08 DIAGNOSIS — D509 Iron deficiency anemia, unspecified: Secondary | ICD-10-CM | POA: Diagnosis not present

## 2018-11-08 DIAGNOSIS — N2581 Secondary hyperparathyroidism of renal origin: Secondary | ICD-10-CM | POA: Diagnosis not present

## 2018-11-08 DIAGNOSIS — N186 End stage renal disease: Secondary | ICD-10-CM | POA: Diagnosis not present

## 2018-11-08 DIAGNOSIS — D631 Anemia in chronic kidney disease: Secondary | ICD-10-CM | POA: Diagnosis not present

## 2018-11-08 DIAGNOSIS — Z5181 Encounter for therapeutic drug level monitoring: Secondary | ICD-10-CM | POA: Diagnosis not present

## 2018-11-08 DIAGNOSIS — E039 Hypothyroidism, unspecified: Secondary | ICD-10-CM | POA: Diagnosis not present

## 2018-11-10 DIAGNOSIS — D509 Iron deficiency anemia, unspecified: Secondary | ICD-10-CM | POA: Diagnosis not present

## 2018-11-10 DIAGNOSIS — D688 Other specified coagulation defects: Secondary | ICD-10-CM | POA: Diagnosis not present

## 2018-11-10 DIAGNOSIS — Z5181 Encounter for therapeutic drug level monitoring: Secondary | ICD-10-CM | POA: Diagnosis not present

## 2018-11-10 DIAGNOSIS — N186 End stage renal disease: Secondary | ICD-10-CM | POA: Diagnosis not present

## 2018-11-10 DIAGNOSIS — D631 Anemia in chronic kidney disease: Secondary | ICD-10-CM | POA: Diagnosis not present

## 2018-11-10 DIAGNOSIS — E039 Hypothyroidism, unspecified: Secondary | ICD-10-CM | POA: Diagnosis not present

## 2018-11-10 DIAGNOSIS — I482 Chronic atrial fibrillation, unspecified: Secondary | ICD-10-CM | POA: Diagnosis not present

## 2018-11-10 DIAGNOSIS — N2581 Secondary hyperparathyroidism of renal origin: Secondary | ICD-10-CM | POA: Diagnosis not present

## 2018-11-13 DIAGNOSIS — D509 Iron deficiency anemia, unspecified: Secondary | ICD-10-CM | POA: Diagnosis not present

## 2018-11-13 DIAGNOSIS — Z5181 Encounter for therapeutic drug level monitoring: Secondary | ICD-10-CM | POA: Diagnosis not present

## 2018-11-13 DIAGNOSIS — N2581 Secondary hyperparathyroidism of renal origin: Secondary | ICD-10-CM | POA: Diagnosis not present

## 2018-11-13 DIAGNOSIS — I482 Chronic atrial fibrillation, unspecified: Secondary | ICD-10-CM | POA: Diagnosis not present

## 2018-11-13 DIAGNOSIS — D688 Other specified coagulation defects: Secondary | ICD-10-CM | POA: Diagnosis not present

## 2018-11-13 DIAGNOSIS — N186 End stage renal disease: Secondary | ICD-10-CM | POA: Diagnosis not present

## 2018-11-13 DIAGNOSIS — E039 Hypothyroidism, unspecified: Secondary | ICD-10-CM | POA: Diagnosis not present

## 2018-11-13 DIAGNOSIS — D631 Anemia in chronic kidney disease: Secondary | ICD-10-CM | POA: Diagnosis not present

## 2018-11-15 DIAGNOSIS — D631 Anemia in chronic kidney disease: Secondary | ICD-10-CM | POA: Diagnosis not present

## 2018-11-15 DIAGNOSIS — Z5181 Encounter for therapeutic drug level monitoring: Secondary | ICD-10-CM | POA: Diagnosis not present

## 2018-11-15 DIAGNOSIS — I482 Chronic atrial fibrillation, unspecified: Secondary | ICD-10-CM | POA: Diagnosis not present

## 2018-11-15 DIAGNOSIS — D509 Iron deficiency anemia, unspecified: Secondary | ICD-10-CM | POA: Diagnosis not present

## 2018-11-15 DIAGNOSIS — N2581 Secondary hyperparathyroidism of renal origin: Secondary | ICD-10-CM | POA: Diagnosis not present

## 2018-11-15 DIAGNOSIS — D688 Other specified coagulation defects: Secondary | ICD-10-CM | POA: Diagnosis not present

## 2018-11-15 DIAGNOSIS — E039 Hypothyroidism, unspecified: Secondary | ICD-10-CM | POA: Diagnosis not present

## 2018-11-15 DIAGNOSIS — N186 End stage renal disease: Secondary | ICD-10-CM | POA: Diagnosis not present

## 2018-11-17 DIAGNOSIS — D688 Other specified coagulation defects: Secondary | ICD-10-CM | POA: Diagnosis not present

## 2018-11-17 DIAGNOSIS — Z5181 Encounter for therapeutic drug level monitoring: Secondary | ICD-10-CM | POA: Diagnosis not present

## 2018-11-17 DIAGNOSIS — D509 Iron deficiency anemia, unspecified: Secondary | ICD-10-CM | POA: Diagnosis not present

## 2018-11-17 DIAGNOSIS — N186 End stage renal disease: Secondary | ICD-10-CM | POA: Diagnosis not present

## 2018-11-17 DIAGNOSIS — E039 Hypothyroidism, unspecified: Secondary | ICD-10-CM | POA: Diagnosis not present

## 2018-11-17 DIAGNOSIS — D631 Anemia in chronic kidney disease: Secondary | ICD-10-CM | POA: Diagnosis not present

## 2018-11-17 DIAGNOSIS — I482 Chronic atrial fibrillation, unspecified: Secondary | ICD-10-CM | POA: Diagnosis not present

## 2018-11-17 DIAGNOSIS — N2581 Secondary hyperparathyroidism of renal origin: Secondary | ICD-10-CM | POA: Diagnosis not present

## 2018-11-18 DIAGNOSIS — I129 Hypertensive chronic kidney disease with stage 1 through stage 4 chronic kidney disease, or unspecified chronic kidney disease: Secondary | ICD-10-CM | POA: Diagnosis not present

## 2018-11-18 DIAGNOSIS — N186 End stage renal disease: Secondary | ICD-10-CM | POA: Diagnosis not present

## 2018-11-18 DIAGNOSIS — Z992 Dependence on renal dialysis: Secondary | ICD-10-CM | POA: Diagnosis not present

## 2018-11-20 DIAGNOSIS — I482 Chronic atrial fibrillation, unspecified: Secondary | ICD-10-CM | POA: Diagnosis not present

## 2018-11-20 DIAGNOSIS — D688 Other specified coagulation defects: Secondary | ICD-10-CM | POA: Diagnosis not present

## 2018-11-20 DIAGNOSIS — D631 Anemia in chronic kidney disease: Secondary | ICD-10-CM | POA: Diagnosis not present

## 2018-11-20 DIAGNOSIS — N2581 Secondary hyperparathyroidism of renal origin: Secondary | ICD-10-CM | POA: Diagnosis not present

## 2018-11-20 DIAGNOSIS — N186 End stage renal disease: Secondary | ICD-10-CM | POA: Diagnosis not present

## 2018-11-20 DIAGNOSIS — E039 Hypothyroidism, unspecified: Secondary | ICD-10-CM | POA: Diagnosis not present

## 2018-11-20 DIAGNOSIS — D509 Iron deficiency anemia, unspecified: Secondary | ICD-10-CM | POA: Diagnosis not present

## 2018-11-20 DIAGNOSIS — Z5181 Encounter for therapeutic drug level monitoring: Secondary | ICD-10-CM | POA: Diagnosis not present

## 2018-11-22 DIAGNOSIS — E039 Hypothyroidism, unspecified: Secondary | ICD-10-CM | POA: Diagnosis not present

## 2018-11-22 DIAGNOSIS — Z5181 Encounter for therapeutic drug level monitoring: Secondary | ICD-10-CM | POA: Diagnosis not present

## 2018-11-22 DIAGNOSIS — N2581 Secondary hyperparathyroidism of renal origin: Secondary | ICD-10-CM | POA: Diagnosis not present

## 2018-11-22 DIAGNOSIS — I482 Chronic atrial fibrillation, unspecified: Secondary | ICD-10-CM | POA: Diagnosis not present

## 2018-11-22 DIAGNOSIS — N186 End stage renal disease: Secondary | ICD-10-CM | POA: Diagnosis not present

## 2018-11-22 DIAGNOSIS — D509 Iron deficiency anemia, unspecified: Secondary | ICD-10-CM | POA: Diagnosis not present

## 2018-11-22 DIAGNOSIS — D688 Other specified coagulation defects: Secondary | ICD-10-CM | POA: Diagnosis not present

## 2018-11-22 DIAGNOSIS — D631 Anemia in chronic kidney disease: Secondary | ICD-10-CM | POA: Diagnosis not present

## 2018-11-24 DIAGNOSIS — D631 Anemia in chronic kidney disease: Secondary | ICD-10-CM | POA: Diagnosis not present

## 2018-11-24 DIAGNOSIS — N186 End stage renal disease: Secondary | ICD-10-CM | POA: Diagnosis not present

## 2018-11-24 DIAGNOSIS — D688 Other specified coagulation defects: Secondary | ICD-10-CM | POA: Diagnosis not present

## 2018-11-24 DIAGNOSIS — N2581 Secondary hyperparathyroidism of renal origin: Secondary | ICD-10-CM | POA: Diagnosis not present

## 2018-11-24 DIAGNOSIS — D509 Iron deficiency anemia, unspecified: Secondary | ICD-10-CM | POA: Diagnosis not present

## 2018-11-24 DIAGNOSIS — I482 Chronic atrial fibrillation, unspecified: Secondary | ICD-10-CM | POA: Diagnosis not present

## 2018-11-24 DIAGNOSIS — E039 Hypothyroidism, unspecified: Secondary | ICD-10-CM | POA: Diagnosis not present

## 2018-11-24 DIAGNOSIS — Z5181 Encounter for therapeutic drug level monitoring: Secondary | ICD-10-CM | POA: Diagnosis not present

## 2018-11-27 ENCOUNTER — Telehealth: Payer: Self-pay

## 2018-11-27 DIAGNOSIS — E039 Hypothyroidism, unspecified: Secondary | ICD-10-CM | POA: Diagnosis not present

## 2018-11-27 DIAGNOSIS — D509 Iron deficiency anemia, unspecified: Secondary | ICD-10-CM | POA: Diagnosis not present

## 2018-11-27 DIAGNOSIS — N186 End stage renal disease: Secondary | ICD-10-CM | POA: Diagnosis not present

## 2018-11-27 DIAGNOSIS — I482 Chronic atrial fibrillation, unspecified: Secondary | ICD-10-CM | POA: Diagnosis not present

## 2018-11-27 DIAGNOSIS — Z5181 Encounter for therapeutic drug level monitoring: Secondary | ICD-10-CM | POA: Diagnosis not present

## 2018-11-27 DIAGNOSIS — N2581 Secondary hyperparathyroidism of renal origin: Secondary | ICD-10-CM | POA: Diagnosis not present

## 2018-11-27 DIAGNOSIS — D688 Other specified coagulation defects: Secondary | ICD-10-CM | POA: Diagnosis not present

## 2018-11-27 DIAGNOSIS — D631 Anemia in chronic kidney disease: Secondary | ICD-10-CM | POA: Diagnosis not present

## 2018-11-27 NOTE — Telephone Encounter (Signed)
lmom to move appt and for prescreen  

## 2018-11-28 ENCOUNTER — Telehealth: Payer: Self-pay | Admitting: *Deleted

## 2018-11-28 NOTE — Telephone Encounter (Signed)

## 2018-11-29 DIAGNOSIS — N186 End stage renal disease: Secondary | ICD-10-CM | POA: Diagnosis not present

## 2018-11-29 DIAGNOSIS — E039 Hypothyroidism, unspecified: Secondary | ICD-10-CM | POA: Diagnosis not present

## 2018-11-29 DIAGNOSIS — I482 Chronic atrial fibrillation, unspecified: Secondary | ICD-10-CM | POA: Diagnosis not present

## 2018-11-29 DIAGNOSIS — Z5181 Encounter for therapeutic drug level monitoring: Secondary | ICD-10-CM | POA: Diagnosis not present

## 2018-11-29 DIAGNOSIS — D631 Anemia in chronic kidney disease: Secondary | ICD-10-CM | POA: Diagnosis not present

## 2018-11-29 DIAGNOSIS — D688 Other specified coagulation defects: Secondary | ICD-10-CM | POA: Diagnosis not present

## 2018-11-29 DIAGNOSIS — D509 Iron deficiency anemia, unspecified: Secondary | ICD-10-CM | POA: Diagnosis not present

## 2018-11-29 DIAGNOSIS — N2581 Secondary hyperparathyroidism of renal origin: Secondary | ICD-10-CM | POA: Diagnosis not present

## 2018-12-01 DIAGNOSIS — D509 Iron deficiency anemia, unspecified: Secondary | ICD-10-CM | POA: Diagnosis not present

## 2018-12-01 DIAGNOSIS — N186 End stage renal disease: Secondary | ICD-10-CM | POA: Diagnosis not present

## 2018-12-01 DIAGNOSIS — I482 Chronic atrial fibrillation, unspecified: Secondary | ICD-10-CM | POA: Diagnosis not present

## 2018-12-01 DIAGNOSIS — E039 Hypothyroidism, unspecified: Secondary | ICD-10-CM | POA: Diagnosis not present

## 2018-12-01 DIAGNOSIS — N2581 Secondary hyperparathyroidism of renal origin: Secondary | ICD-10-CM | POA: Diagnosis not present

## 2018-12-01 DIAGNOSIS — D631 Anemia in chronic kidney disease: Secondary | ICD-10-CM | POA: Diagnosis not present

## 2018-12-01 DIAGNOSIS — D688 Other specified coagulation defects: Secondary | ICD-10-CM | POA: Diagnosis not present

## 2018-12-01 DIAGNOSIS — Z5181 Encounter for therapeutic drug level monitoring: Secondary | ICD-10-CM | POA: Diagnosis not present

## 2018-12-03 ENCOUNTER — Other Ambulatory Visit: Payer: Self-pay

## 2018-12-03 ENCOUNTER — Ambulatory Visit: Payer: Medicare Other | Admitting: Pharmacist

## 2018-12-03 DIAGNOSIS — I4891 Unspecified atrial fibrillation: Secondary | ICD-10-CM | POA: Diagnosis not present

## 2018-12-03 DIAGNOSIS — Z5181 Encounter for therapeutic drug level monitoring: Secondary | ICD-10-CM

## 2018-12-03 DIAGNOSIS — I480 Paroxysmal atrial fibrillation: Secondary | ICD-10-CM

## 2018-12-03 LAB — POCT INR: INR: 2.4 (ref 2.0–3.0)

## 2018-12-03 NOTE — Patient Instructions (Signed)
Description   Continue taking 1/2 tablet daily except 1 tablet on Mondays, Wednesdays, and Fridays. Recheck INR in 8 weeks. Call with any new medications or if scheduled for any procedures or with any questions [phone]

## 2018-12-04 DIAGNOSIS — D631 Anemia in chronic kidney disease: Secondary | ICD-10-CM | POA: Diagnosis not present

## 2018-12-04 DIAGNOSIS — D688 Other specified coagulation defects: Secondary | ICD-10-CM | POA: Diagnosis not present

## 2018-12-04 DIAGNOSIS — I482 Chronic atrial fibrillation, unspecified: Secondary | ICD-10-CM | POA: Diagnosis not present

## 2018-12-04 DIAGNOSIS — N186 End stage renal disease: Secondary | ICD-10-CM | POA: Diagnosis not present

## 2018-12-04 DIAGNOSIS — N2581 Secondary hyperparathyroidism of renal origin: Secondary | ICD-10-CM | POA: Diagnosis not present

## 2018-12-04 DIAGNOSIS — D509 Iron deficiency anemia, unspecified: Secondary | ICD-10-CM | POA: Diagnosis not present

## 2018-12-04 DIAGNOSIS — E039 Hypothyroidism, unspecified: Secondary | ICD-10-CM | POA: Diagnosis not present

## 2018-12-04 DIAGNOSIS — Z5181 Encounter for therapeutic drug level monitoring: Secondary | ICD-10-CM | POA: Diagnosis not present

## 2018-12-06 DIAGNOSIS — N2581 Secondary hyperparathyroidism of renal origin: Secondary | ICD-10-CM | POA: Diagnosis not present

## 2018-12-06 DIAGNOSIS — D688 Other specified coagulation defects: Secondary | ICD-10-CM | POA: Diagnosis not present

## 2018-12-06 DIAGNOSIS — Z5181 Encounter for therapeutic drug level monitoring: Secondary | ICD-10-CM | POA: Diagnosis not present

## 2018-12-06 DIAGNOSIS — N186 End stage renal disease: Secondary | ICD-10-CM | POA: Diagnosis not present

## 2018-12-06 DIAGNOSIS — D631 Anemia in chronic kidney disease: Secondary | ICD-10-CM | POA: Diagnosis not present

## 2018-12-06 DIAGNOSIS — E039 Hypothyroidism, unspecified: Secondary | ICD-10-CM | POA: Diagnosis not present

## 2018-12-06 DIAGNOSIS — D509 Iron deficiency anemia, unspecified: Secondary | ICD-10-CM | POA: Diagnosis not present

## 2018-12-06 DIAGNOSIS — I482 Chronic atrial fibrillation, unspecified: Secondary | ICD-10-CM | POA: Diagnosis not present

## 2018-12-08 DIAGNOSIS — E039 Hypothyroidism, unspecified: Secondary | ICD-10-CM | POA: Diagnosis not present

## 2018-12-08 DIAGNOSIS — N186 End stage renal disease: Secondary | ICD-10-CM | POA: Diagnosis not present

## 2018-12-08 DIAGNOSIS — N2581 Secondary hyperparathyroidism of renal origin: Secondary | ICD-10-CM | POA: Diagnosis not present

## 2018-12-08 DIAGNOSIS — D688 Other specified coagulation defects: Secondary | ICD-10-CM | POA: Diagnosis not present

## 2018-12-08 DIAGNOSIS — I482 Chronic atrial fibrillation, unspecified: Secondary | ICD-10-CM | POA: Diagnosis not present

## 2018-12-08 DIAGNOSIS — Z5181 Encounter for therapeutic drug level monitoring: Secondary | ICD-10-CM | POA: Diagnosis not present

## 2018-12-08 DIAGNOSIS — D631 Anemia in chronic kidney disease: Secondary | ICD-10-CM | POA: Diagnosis not present

## 2018-12-08 DIAGNOSIS — D509 Iron deficiency anemia, unspecified: Secondary | ICD-10-CM | POA: Diagnosis not present

## 2018-12-11 DIAGNOSIS — N186 End stage renal disease: Secondary | ICD-10-CM | POA: Diagnosis not present

## 2018-12-11 DIAGNOSIS — E039 Hypothyroidism, unspecified: Secondary | ICD-10-CM | POA: Diagnosis not present

## 2018-12-11 DIAGNOSIS — N2581 Secondary hyperparathyroidism of renal origin: Secondary | ICD-10-CM | POA: Diagnosis not present

## 2018-12-11 DIAGNOSIS — D688 Other specified coagulation defects: Secondary | ICD-10-CM | POA: Diagnosis not present

## 2018-12-11 DIAGNOSIS — Z5181 Encounter for therapeutic drug level monitoring: Secondary | ICD-10-CM | POA: Diagnosis not present

## 2018-12-11 DIAGNOSIS — I482 Chronic atrial fibrillation, unspecified: Secondary | ICD-10-CM | POA: Diagnosis not present

## 2018-12-11 DIAGNOSIS — D509 Iron deficiency anemia, unspecified: Secondary | ICD-10-CM | POA: Diagnosis not present

## 2018-12-11 DIAGNOSIS — D631 Anemia in chronic kidney disease: Secondary | ICD-10-CM | POA: Diagnosis not present

## 2018-12-13 DIAGNOSIS — Z5181 Encounter for therapeutic drug level monitoring: Secondary | ICD-10-CM | POA: Diagnosis not present

## 2018-12-13 DIAGNOSIS — E039 Hypothyroidism, unspecified: Secondary | ICD-10-CM | POA: Diagnosis not present

## 2018-12-13 DIAGNOSIS — D509 Iron deficiency anemia, unspecified: Secondary | ICD-10-CM | POA: Diagnosis not present

## 2018-12-13 DIAGNOSIS — N186 End stage renal disease: Secondary | ICD-10-CM | POA: Diagnosis not present

## 2018-12-13 DIAGNOSIS — I482 Chronic atrial fibrillation, unspecified: Secondary | ICD-10-CM | POA: Diagnosis not present

## 2018-12-13 DIAGNOSIS — D688 Other specified coagulation defects: Secondary | ICD-10-CM | POA: Diagnosis not present

## 2018-12-13 DIAGNOSIS — N2581 Secondary hyperparathyroidism of renal origin: Secondary | ICD-10-CM | POA: Diagnosis not present

## 2018-12-13 DIAGNOSIS — D631 Anemia in chronic kidney disease: Secondary | ICD-10-CM | POA: Diagnosis not present

## 2018-12-15 DIAGNOSIS — N2581 Secondary hyperparathyroidism of renal origin: Secondary | ICD-10-CM | POA: Diagnosis not present

## 2018-12-15 DIAGNOSIS — Z5181 Encounter for therapeutic drug level monitoring: Secondary | ICD-10-CM | POA: Diagnosis not present

## 2018-12-15 DIAGNOSIS — E039 Hypothyroidism, unspecified: Secondary | ICD-10-CM | POA: Diagnosis not present

## 2018-12-15 DIAGNOSIS — N186 End stage renal disease: Secondary | ICD-10-CM | POA: Diagnosis not present

## 2018-12-15 DIAGNOSIS — I482 Chronic atrial fibrillation, unspecified: Secondary | ICD-10-CM | POA: Diagnosis not present

## 2018-12-15 DIAGNOSIS — D688 Other specified coagulation defects: Secondary | ICD-10-CM | POA: Diagnosis not present

## 2018-12-15 DIAGNOSIS — D631 Anemia in chronic kidney disease: Secondary | ICD-10-CM | POA: Diagnosis not present

## 2018-12-15 DIAGNOSIS — D509 Iron deficiency anemia, unspecified: Secondary | ICD-10-CM | POA: Diagnosis not present

## 2018-12-18 DIAGNOSIS — E039 Hypothyroidism, unspecified: Secondary | ICD-10-CM | POA: Diagnosis not present

## 2018-12-18 DIAGNOSIS — I482 Chronic atrial fibrillation, unspecified: Secondary | ICD-10-CM | POA: Diagnosis not present

## 2018-12-18 DIAGNOSIS — D509 Iron deficiency anemia, unspecified: Secondary | ICD-10-CM | POA: Diagnosis not present

## 2018-12-18 DIAGNOSIS — Z5181 Encounter for therapeutic drug level monitoring: Secondary | ICD-10-CM | POA: Diagnosis not present

## 2018-12-18 DIAGNOSIS — Z992 Dependence on renal dialysis: Secondary | ICD-10-CM | POA: Diagnosis not present

## 2018-12-18 DIAGNOSIS — D631 Anemia in chronic kidney disease: Secondary | ICD-10-CM | POA: Diagnosis not present

## 2018-12-18 DIAGNOSIS — D688 Other specified coagulation defects: Secondary | ICD-10-CM | POA: Diagnosis not present

## 2018-12-18 DIAGNOSIS — N186 End stage renal disease: Secondary | ICD-10-CM | POA: Diagnosis not present

## 2018-12-18 DIAGNOSIS — N2581 Secondary hyperparathyroidism of renal origin: Secondary | ICD-10-CM | POA: Diagnosis not present

## 2018-12-18 DIAGNOSIS — I129 Hypertensive chronic kidney disease with stage 1 through stage 4 chronic kidney disease, or unspecified chronic kidney disease: Secondary | ICD-10-CM | POA: Diagnosis not present

## 2018-12-20 DIAGNOSIS — D631 Anemia in chronic kidney disease: Secondary | ICD-10-CM | POA: Diagnosis not present

## 2018-12-20 DIAGNOSIS — N186 End stage renal disease: Secondary | ICD-10-CM | POA: Diagnosis not present

## 2018-12-20 DIAGNOSIS — Z5181 Encounter for therapeutic drug level monitoring: Secondary | ICD-10-CM | POA: Diagnosis not present

## 2018-12-20 DIAGNOSIS — N2581 Secondary hyperparathyroidism of renal origin: Secondary | ICD-10-CM | POA: Diagnosis not present

## 2018-12-20 DIAGNOSIS — E039 Hypothyroidism, unspecified: Secondary | ICD-10-CM | POA: Diagnosis not present

## 2018-12-20 DIAGNOSIS — D509 Iron deficiency anemia, unspecified: Secondary | ICD-10-CM | POA: Diagnosis not present

## 2018-12-20 DIAGNOSIS — D688 Other specified coagulation defects: Secondary | ICD-10-CM | POA: Diagnosis not present

## 2018-12-20 DIAGNOSIS — I482 Chronic atrial fibrillation, unspecified: Secondary | ICD-10-CM | POA: Diagnosis not present

## 2018-12-22 DIAGNOSIS — N2581 Secondary hyperparathyroidism of renal origin: Secondary | ICD-10-CM | POA: Diagnosis not present

## 2018-12-22 DIAGNOSIS — I482 Chronic atrial fibrillation, unspecified: Secondary | ICD-10-CM | POA: Diagnosis not present

## 2018-12-22 DIAGNOSIS — D509 Iron deficiency anemia, unspecified: Secondary | ICD-10-CM | POA: Diagnosis not present

## 2018-12-22 DIAGNOSIS — N186 End stage renal disease: Secondary | ICD-10-CM | POA: Diagnosis not present

## 2018-12-22 DIAGNOSIS — E039 Hypothyroidism, unspecified: Secondary | ICD-10-CM | POA: Diagnosis not present

## 2018-12-22 DIAGNOSIS — Z5181 Encounter for therapeutic drug level monitoring: Secondary | ICD-10-CM | POA: Diagnosis not present

## 2018-12-22 DIAGNOSIS — D688 Other specified coagulation defects: Secondary | ICD-10-CM | POA: Diagnosis not present

## 2018-12-22 DIAGNOSIS — D631 Anemia in chronic kidney disease: Secondary | ICD-10-CM | POA: Diagnosis not present

## 2018-12-25 DIAGNOSIS — D509 Iron deficiency anemia, unspecified: Secondary | ICD-10-CM | POA: Diagnosis not present

## 2018-12-25 DIAGNOSIS — I482 Chronic atrial fibrillation, unspecified: Secondary | ICD-10-CM | POA: Diagnosis not present

## 2018-12-25 DIAGNOSIS — D631 Anemia in chronic kidney disease: Secondary | ICD-10-CM | POA: Diagnosis not present

## 2018-12-25 DIAGNOSIS — D688 Other specified coagulation defects: Secondary | ICD-10-CM | POA: Diagnosis not present

## 2018-12-25 DIAGNOSIS — N186 End stage renal disease: Secondary | ICD-10-CM | POA: Diagnosis not present

## 2018-12-25 DIAGNOSIS — E039 Hypothyroidism, unspecified: Secondary | ICD-10-CM | POA: Diagnosis not present

## 2018-12-25 DIAGNOSIS — N2581 Secondary hyperparathyroidism of renal origin: Secondary | ICD-10-CM | POA: Diagnosis not present

## 2018-12-25 DIAGNOSIS — Z5181 Encounter for therapeutic drug level monitoring: Secondary | ICD-10-CM | POA: Diagnosis not present

## 2018-12-27 DIAGNOSIS — N2581 Secondary hyperparathyroidism of renal origin: Secondary | ICD-10-CM | POA: Diagnosis not present

## 2018-12-27 DIAGNOSIS — D688 Other specified coagulation defects: Secondary | ICD-10-CM | POA: Diagnosis not present

## 2018-12-27 DIAGNOSIS — I482 Chronic atrial fibrillation, unspecified: Secondary | ICD-10-CM | POA: Diagnosis not present

## 2018-12-27 DIAGNOSIS — E039 Hypothyroidism, unspecified: Secondary | ICD-10-CM | POA: Diagnosis not present

## 2018-12-27 DIAGNOSIS — D631 Anemia in chronic kidney disease: Secondary | ICD-10-CM | POA: Diagnosis not present

## 2018-12-27 DIAGNOSIS — Z5181 Encounter for therapeutic drug level monitoring: Secondary | ICD-10-CM | POA: Diagnosis not present

## 2018-12-27 DIAGNOSIS — D509 Iron deficiency anemia, unspecified: Secondary | ICD-10-CM | POA: Diagnosis not present

## 2018-12-27 DIAGNOSIS — N186 End stage renal disease: Secondary | ICD-10-CM | POA: Diagnosis not present

## 2018-12-29 DIAGNOSIS — E039 Hypothyroidism, unspecified: Secondary | ICD-10-CM | POA: Diagnosis not present

## 2018-12-29 DIAGNOSIS — I482 Chronic atrial fibrillation, unspecified: Secondary | ICD-10-CM | POA: Diagnosis not present

## 2018-12-29 DIAGNOSIS — D509 Iron deficiency anemia, unspecified: Secondary | ICD-10-CM | POA: Diagnosis not present

## 2018-12-29 DIAGNOSIS — N186 End stage renal disease: Secondary | ICD-10-CM | POA: Diagnosis not present

## 2018-12-29 DIAGNOSIS — D688 Other specified coagulation defects: Secondary | ICD-10-CM | POA: Diagnosis not present

## 2018-12-29 DIAGNOSIS — Z5181 Encounter for therapeutic drug level monitoring: Secondary | ICD-10-CM | POA: Diagnosis not present

## 2018-12-29 DIAGNOSIS — N2581 Secondary hyperparathyroidism of renal origin: Secondary | ICD-10-CM | POA: Diagnosis not present

## 2018-12-29 DIAGNOSIS — D631 Anemia in chronic kidney disease: Secondary | ICD-10-CM | POA: Diagnosis not present

## 2019-01-01 DIAGNOSIS — D631 Anemia in chronic kidney disease: Secondary | ICD-10-CM | POA: Diagnosis not present

## 2019-01-01 DIAGNOSIS — D509 Iron deficiency anemia, unspecified: Secondary | ICD-10-CM | POA: Diagnosis not present

## 2019-01-01 DIAGNOSIS — N2581 Secondary hyperparathyroidism of renal origin: Secondary | ICD-10-CM | POA: Diagnosis not present

## 2019-01-01 DIAGNOSIS — D688 Other specified coagulation defects: Secondary | ICD-10-CM | POA: Diagnosis not present

## 2019-01-01 DIAGNOSIS — N186 End stage renal disease: Secondary | ICD-10-CM | POA: Diagnosis not present

## 2019-01-01 DIAGNOSIS — E039 Hypothyroidism, unspecified: Secondary | ICD-10-CM | POA: Diagnosis not present

## 2019-01-01 DIAGNOSIS — Z5181 Encounter for therapeutic drug level monitoring: Secondary | ICD-10-CM | POA: Diagnosis not present

## 2019-01-01 DIAGNOSIS — I482 Chronic atrial fibrillation, unspecified: Secondary | ICD-10-CM | POA: Diagnosis not present

## 2019-01-03 DIAGNOSIS — N2581 Secondary hyperparathyroidism of renal origin: Secondary | ICD-10-CM | POA: Diagnosis not present

## 2019-01-03 DIAGNOSIS — D688 Other specified coagulation defects: Secondary | ICD-10-CM | POA: Diagnosis not present

## 2019-01-03 DIAGNOSIS — D509 Iron deficiency anemia, unspecified: Secondary | ICD-10-CM | POA: Diagnosis not present

## 2019-01-03 DIAGNOSIS — D631 Anemia in chronic kidney disease: Secondary | ICD-10-CM | POA: Diagnosis not present

## 2019-01-03 DIAGNOSIS — Z5181 Encounter for therapeutic drug level monitoring: Secondary | ICD-10-CM | POA: Diagnosis not present

## 2019-01-03 DIAGNOSIS — N186 End stage renal disease: Secondary | ICD-10-CM | POA: Diagnosis not present

## 2019-01-03 DIAGNOSIS — E039 Hypothyroidism, unspecified: Secondary | ICD-10-CM | POA: Diagnosis not present

## 2019-01-03 DIAGNOSIS — I482 Chronic atrial fibrillation, unspecified: Secondary | ICD-10-CM | POA: Diagnosis not present

## 2019-01-05 DIAGNOSIS — D688 Other specified coagulation defects: Secondary | ICD-10-CM | POA: Diagnosis not present

## 2019-01-05 DIAGNOSIS — D509 Iron deficiency anemia, unspecified: Secondary | ICD-10-CM | POA: Diagnosis not present

## 2019-01-05 DIAGNOSIS — D631 Anemia in chronic kidney disease: Secondary | ICD-10-CM | POA: Diagnosis not present

## 2019-01-05 DIAGNOSIS — N2581 Secondary hyperparathyroidism of renal origin: Secondary | ICD-10-CM | POA: Diagnosis not present

## 2019-01-05 DIAGNOSIS — E039 Hypothyroidism, unspecified: Secondary | ICD-10-CM | POA: Diagnosis not present

## 2019-01-05 DIAGNOSIS — N186 End stage renal disease: Secondary | ICD-10-CM | POA: Diagnosis not present

## 2019-01-05 DIAGNOSIS — I482 Chronic atrial fibrillation, unspecified: Secondary | ICD-10-CM | POA: Diagnosis not present

## 2019-01-05 DIAGNOSIS — Z5181 Encounter for therapeutic drug level monitoring: Secondary | ICD-10-CM | POA: Diagnosis not present

## 2019-01-08 DIAGNOSIS — D631 Anemia in chronic kidney disease: Secondary | ICD-10-CM | POA: Diagnosis not present

## 2019-01-08 DIAGNOSIS — D509 Iron deficiency anemia, unspecified: Secondary | ICD-10-CM | POA: Diagnosis not present

## 2019-01-08 DIAGNOSIS — N186 End stage renal disease: Secondary | ICD-10-CM | POA: Diagnosis not present

## 2019-01-08 DIAGNOSIS — E039 Hypothyroidism, unspecified: Secondary | ICD-10-CM | POA: Diagnosis not present

## 2019-01-08 DIAGNOSIS — I482 Chronic atrial fibrillation, unspecified: Secondary | ICD-10-CM | POA: Diagnosis not present

## 2019-01-08 DIAGNOSIS — D688 Other specified coagulation defects: Secondary | ICD-10-CM | POA: Diagnosis not present

## 2019-01-08 DIAGNOSIS — Z5181 Encounter for therapeutic drug level monitoring: Secondary | ICD-10-CM | POA: Diagnosis not present

## 2019-01-08 DIAGNOSIS — N2581 Secondary hyperparathyroidism of renal origin: Secondary | ICD-10-CM | POA: Diagnosis not present

## 2019-01-10 DIAGNOSIS — D631 Anemia in chronic kidney disease: Secondary | ICD-10-CM | POA: Diagnosis not present

## 2019-01-10 DIAGNOSIS — Z5181 Encounter for therapeutic drug level monitoring: Secondary | ICD-10-CM | POA: Diagnosis not present

## 2019-01-10 DIAGNOSIS — D509 Iron deficiency anemia, unspecified: Secondary | ICD-10-CM | POA: Diagnosis not present

## 2019-01-10 DIAGNOSIS — I482 Chronic atrial fibrillation, unspecified: Secondary | ICD-10-CM | POA: Diagnosis not present

## 2019-01-10 DIAGNOSIS — D688 Other specified coagulation defects: Secondary | ICD-10-CM | POA: Diagnosis not present

## 2019-01-10 DIAGNOSIS — N186 End stage renal disease: Secondary | ICD-10-CM | POA: Diagnosis not present

## 2019-01-10 DIAGNOSIS — N2581 Secondary hyperparathyroidism of renal origin: Secondary | ICD-10-CM | POA: Diagnosis not present

## 2019-01-10 DIAGNOSIS — E039 Hypothyroidism, unspecified: Secondary | ICD-10-CM | POA: Diagnosis not present

## 2019-01-12 DIAGNOSIS — D509 Iron deficiency anemia, unspecified: Secondary | ICD-10-CM | POA: Diagnosis not present

## 2019-01-12 DIAGNOSIS — N186 End stage renal disease: Secondary | ICD-10-CM | POA: Diagnosis not present

## 2019-01-12 DIAGNOSIS — N2581 Secondary hyperparathyroidism of renal origin: Secondary | ICD-10-CM | POA: Diagnosis not present

## 2019-01-12 DIAGNOSIS — E039 Hypothyroidism, unspecified: Secondary | ICD-10-CM | POA: Diagnosis not present

## 2019-01-12 DIAGNOSIS — D631 Anemia in chronic kidney disease: Secondary | ICD-10-CM | POA: Diagnosis not present

## 2019-01-12 DIAGNOSIS — Z5181 Encounter for therapeutic drug level monitoring: Secondary | ICD-10-CM | POA: Diagnosis not present

## 2019-01-12 DIAGNOSIS — I482 Chronic atrial fibrillation, unspecified: Secondary | ICD-10-CM | POA: Diagnosis not present

## 2019-01-12 DIAGNOSIS — D688 Other specified coagulation defects: Secondary | ICD-10-CM | POA: Diagnosis not present

## 2019-01-15 DIAGNOSIS — E039 Hypothyroidism, unspecified: Secondary | ICD-10-CM | POA: Diagnosis not present

## 2019-01-15 DIAGNOSIS — I482 Chronic atrial fibrillation, unspecified: Secondary | ICD-10-CM | POA: Diagnosis not present

## 2019-01-15 DIAGNOSIS — Z5181 Encounter for therapeutic drug level monitoring: Secondary | ICD-10-CM | POA: Diagnosis not present

## 2019-01-15 DIAGNOSIS — D688 Other specified coagulation defects: Secondary | ICD-10-CM | POA: Diagnosis not present

## 2019-01-15 DIAGNOSIS — D631 Anemia in chronic kidney disease: Secondary | ICD-10-CM | POA: Diagnosis not present

## 2019-01-15 DIAGNOSIS — N186 End stage renal disease: Secondary | ICD-10-CM | POA: Diagnosis not present

## 2019-01-15 DIAGNOSIS — N2581 Secondary hyperparathyroidism of renal origin: Secondary | ICD-10-CM | POA: Diagnosis not present

## 2019-01-15 DIAGNOSIS — D509 Iron deficiency anemia, unspecified: Secondary | ICD-10-CM | POA: Diagnosis not present

## 2019-01-17 DIAGNOSIS — D631 Anemia in chronic kidney disease: Secondary | ICD-10-CM | POA: Diagnosis not present

## 2019-01-17 DIAGNOSIS — Z5181 Encounter for therapeutic drug level monitoring: Secondary | ICD-10-CM | POA: Diagnosis not present

## 2019-01-17 DIAGNOSIS — N186 End stage renal disease: Secondary | ICD-10-CM | POA: Diagnosis not present

## 2019-01-17 DIAGNOSIS — N2581 Secondary hyperparathyroidism of renal origin: Secondary | ICD-10-CM | POA: Diagnosis not present

## 2019-01-17 DIAGNOSIS — I482 Chronic atrial fibrillation, unspecified: Secondary | ICD-10-CM | POA: Diagnosis not present

## 2019-01-17 DIAGNOSIS — D688 Other specified coagulation defects: Secondary | ICD-10-CM | POA: Diagnosis not present

## 2019-01-17 DIAGNOSIS — E039 Hypothyroidism, unspecified: Secondary | ICD-10-CM | POA: Diagnosis not present

## 2019-01-17 DIAGNOSIS — D509 Iron deficiency anemia, unspecified: Secondary | ICD-10-CM | POA: Diagnosis not present

## 2019-01-18 DIAGNOSIS — I129 Hypertensive chronic kidney disease with stage 1 through stage 4 chronic kidney disease, or unspecified chronic kidney disease: Secondary | ICD-10-CM | POA: Diagnosis not present

## 2019-01-18 DIAGNOSIS — N186 End stage renal disease: Secondary | ICD-10-CM | POA: Diagnosis not present

## 2019-01-18 DIAGNOSIS — Z992 Dependence on renal dialysis: Secondary | ICD-10-CM | POA: Diagnosis not present

## 2019-01-19 DIAGNOSIS — Z992 Dependence on renal dialysis: Secondary | ICD-10-CM | POA: Diagnosis not present

## 2019-01-19 DIAGNOSIS — N186 End stage renal disease: Secondary | ICD-10-CM | POA: Diagnosis not present

## 2019-01-19 DIAGNOSIS — Z5181 Encounter for therapeutic drug level monitoring: Secondary | ICD-10-CM | POA: Diagnosis not present

## 2019-01-19 DIAGNOSIS — D509 Iron deficiency anemia, unspecified: Secondary | ICD-10-CM | POA: Diagnosis not present

## 2019-01-19 DIAGNOSIS — D688 Other specified coagulation defects: Secondary | ICD-10-CM | POA: Diagnosis not present

## 2019-01-19 DIAGNOSIS — I482 Chronic atrial fibrillation, unspecified: Secondary | ICD-10-CM | POA: Diagnosis not present

## 2019-01-19 DIAGNOSIS — D631 Anemia in chronic kidney disease: Secondary | ICD-10-CM | POA: Diagnosis not present

## 2019-01-19 DIAGNOSIS — N2581 Secondary hyperparathyroidism of renal origin: Secondary | ICD-10-CM | POA: Diagnosis not present

## 2019-01-19 DIAGNOSIS — E039 Hypothyroidism, unspecified: Secondary | ICD-10-CM | POA: Diagnosis not present

## 2019-01-19 DIAGNOSIS — R51 Headache: Secondary | ICD-10-CM | POA: Diagnosis not present

## 2019-01-22 DIAGNOSIS — N186 End stage renal disease: Secondary | ICD-10-CM | POA: Diagnosis not present

## 2019-01-22 DIAGNOSIS — I482 Chronic atrial fibrillation, unspecified: Secondary | ICD-10-CM | POA: Diagnosis not present

## 2019-01-22 DIAGNOSIS — N2581 Secondary hyperparathyroidism of renal origin: Secondary | ICD-10-CM | POA: Diagnosis not present

## 2019-01-22 DIAGNOSIS — Z992 Dependence on renal dialysis: Secondary | ICD-10-CM | POA: Diagnosis not present

## 2019-01-22 DIAGNOSIS — D631 Anemia in chronic kidney disease: Secondary | ICD-10-CM | POA: Diagnosis not present

## 2019-01-22 DIAGNOSIS — D509 Iron deficiency anemia, unspecified: Secondary | ICD-10-CM | POA: Diagnosis not present

## 2019-01-22 DIAGNOSIS — R51 Headache: Secondary | ICD-10-CM | POA: Diagnosis not present

## 2019-01-22 DIAGNOSIS — Z5181 Encounter for therapeutic drug level monitoring: Secondary | ICD-10-CM | POA: Diagnosis not present

## 2019-01-22 DIAGNOSIS — E039 Hypothyroidism, unspecified: Secondary | ICD-10-CM | POA: Diagnosis not present

## 2019-01-22 DIAGNOSIS — D688 Other specified coagulation defects: Secondary | ICD-10-CM | POA: Diagnosis not present

## 2019-01-24 DIAGNOSIS — D509 Iron deficiency anemia, unspecified: Secondary | ICD-10-CM | POA: Diagnosis not present

## 2019-01-24 DIAGNOSIS — Z992 Dependence on renal dialysis: Secondary | ICD-10-CM | POA: Diagnosis not present

## 2019-01-24 DIAGNOSIS — D631 Anemia in chronic kidney disease: Secondary | ICD-10-CM | POA: Diagnosis not present

## 2019-01-24 DIAGNOSIS — I482 Chronic atrial fibrillation, unspecified: Secondary | ICD-10-CM | POA: Diagnosis not present

## 2019-01-24 DIAGNOSIS — R51 Headache: Secondary | ICD-10-CM | POA: Diagnosis not present

## 2019-01-24 DIAGNOSIS — Z5181 Encounter for therapeutic drug level monitoring: Secondary | ICD-10-CM | POA: Diagnosis not present

## 2019-01-24 DIAGNOSIS — E039 Hypothyroidism, unspecified: Secondary | ICD-10-CM | POA: Diagnosis not present

## 2019-01-24 DIAGNOSIS — D688 Other specified coagulation defects: Secondary | ICD-10-CM | POA: Diagnosis not present

## 2019-01-24 DIAGNOSIS — N186 End stage renal disease: Secondary | ICD-10-CM | POA: Diagnosis not present

## 2019-01-24 DIAGNOSIS — N2581 Secondary hyperparathyroidism of renal origin: Secondary | ICD-10-CM | POA: Diagnosis not present

## 2019-01-26 DIAGNOSIS — D509 Iron deficiency anemia, unspecified: Secondary | ICD-10-CM | POA: Diagnosis not present

## 2019-01-26 DIAGNOSIS — N186 End stage renal disease: Secondary | ICD-10-CM | POA: Diagnosis not present

## 2019-01-26 DIAGNOSIS — D688 Other specified coagulation defects: Secondary | ICD-10-CM | POA: Diagnosis not present

## 2019-01-26 DIAGNOSIS — N2581 Secondary hyperparathyroidism of renal origin: Secondary | ICD-10-CM | POA: Diagnosis not present

## 2019-01-26 DIAGNOSIS — D631 Anemia in chronic kidney disease: Secondary | ICD-10-CM | POA: Diagnosis not present

## 2019-01-26 DIAGNOSIS — R51 Headache: Secondary | ICD-10-CM | POA: Diagnosis not present

## 2019-01-26 DIAGNOSIS — Z5181 Encounter for therapeutic drug level monitoring: Secondary | ICD-10-CM | POA: Diagnosis not present

## 2019-01-26 DIAGNOSIS — E039 Hypothyroidism, unspecified: Secondary | ICD-10-CM | POA: Diagnosis not present

## 2019-01-26 DIAGNOSIS — I482 Chronic atrial fibrillation, unspecified: Secondary | ICD-10-CM | POA: Diagnosis not present

## 2019-01-26 DIAGNOSIS — Z992 Dependence on renal dialysis: Secondary | ICD-10-CM | POA: Diagnosis not present

## 2019-01-28 ENCOUNTER — Other Ambulatory Visit: Payer: Self-pay

## 2019-01-28 ENCOUNTER — Ambulatory Visit (INDEPENDENT_AMBULATORY_CARE_PROVIDER_SITE_OTHER): Payer: Medicare Other

## 2019-01-28 DIAGNOSIS — Z5181 Encounter for therapeutic drug level monitoring: Secondary | ICD-10-CM

## 2019-01-28 DIAGNOSIS — I4891 Unspecified atrial fibrillation: Secondary | ICD-10-CM | POA: Diagnosis not present

## 2019-01-28 DIAGNOSIS — I480 Paroxysmal atrial fibrillation: Secondary | ICD-10-CM

## 2019-01-28 LAB — POCT INR: INR: 2.4 (ref 2.0–3.0)

## 2019-01-28 NOTE — Patient Instructions (Signed)
Description   Continue taking 1/2 tablet daily except 1 tablet on Mondays, Wednesdays, and Fridays. Recheck INR in 8 weeks. Call with any new medications or if scheduled for any procedures or with any questions [phone]

## 2019-01-29 DIAGNOSIS — N2581 Secondary hyperparathyroidism of renal origin: Secondary | ICD-10-CM | POA: Diagnosis not present

## 2019-01-29 DIAGNOSIS — I482 Chronic atrial fibrillation, unspecified: Secondary | ICD-10-CM | POA: Diagnosis not present

## 2019-01-29 DIAGNOSIS — Z992 Dependence on renal dialysis: Secondary | ICD-10-CM | POA: Diagnosis not present

## 2019-01-29 DIAGNOSIS — Z5181 Encounter for therapeutic drug level monitoring: Secondary | ICD-10-CM | POA: Diagnosis not present

## 2019-01-29 DIAGNOSIS — D688 Other specified coagulation defects: Secondary | ICD-10-CM | POA: Diagnosis not present

## 2019-01-29 DIAGNOSIS — N186 End stage renal disease: Secondary | ICD-10-CM | POA: Diagnosis not present

## 2019-01-29 DIAGNOSIS — E039 Hypothyroidism, unspecified: Secondary | ICD-10-CM | POA: Diagnosis not present

## 2019-01-29 DIAGNOSIS — D631 Anemia in chronic kidney disease: Secondary | ICD-10-CM | POA: Diagnosis not present

## 2019-01-29 DIAGNOSIS — D509 Iron deficiency anemia, unspecified: Secondary | ICD-10-CM | POA: Diagnosis not present

## 2019-01-29 DIAGNOSIS — R51 Headache: Secondary | ICD-10-CM | POA: Diagnosis not present

## 2019-01-31 DIAGNOSIS — D688 Other specified coagulation defects: Secondary | ICD-10-CM | POA: Diagnosis not present

## 2019-01-31 DIAGNOSIS — N2581 Secondary hyperparathyroidism of renal origin: Secondary | ICD-10-CM | POA: Diagnosis not present

## 2019-01-31 DIAGNOSIS — R51 Headache: Secondary | ICD-10-CM | POA: Diagnosis not present

## 2019-01-31 DIAGNOSIS — E039 Hypothyroidism, unspecified: Secondary | ICD-10-CM | POA: Diagnosis not present

## 2019-01-31 DIAGNOSIS — D509 Iron deficiency anemia, unspecified: Secondary | ICD-10-CM | POA: Diagnosis not present

## 2019-01-31 DIAGNOSIS — D631 Anemia in chronic kidney disease: Secondary | ICD-10-CM | POA: Diagnosis not present

## 2019-01-31 DIAGNOSIS — N186 End stage renal disease: Secondary | ICD-10-CM | POA: Diagnosis not present

## 2019-01-31 DIAGNOSIS — Z992 Dependence on renal dialysis: Secondary | ICD-10-CM | POA: Diagnosis not present

## 2019-01-31 DIAGNOSIS — I482 Chronic atrial fibrillation, unspecified: Secondary | ICD-10-CM | POA: Diagnosis not present

## 2019-01-31 DIAGNOSIS — Z5181 Encounter for therapeutic drug level monitoring: Secondary | ICD-10-CM | POA: Diagnosis not present

## 2019-02-02 DIAGNOSIS — Z992 Dependence on renal dialysis: Secondary | ICD-10-CM | POA: Diagnosis not present

## 2019-02-02 DIAGNOSIS — D631 Anemia in chronic kidney disease: Secondary | ICD-10-CM | POA: Diagnosis not present

## 2019-02-02 DIAGNOSIS — R51 Headache: Secondary | ICD-10-CM | POA: Diagnosis not present

## 2019-02-02 DIAGNOSIS — I482 Chronic atrial fibrillation, unspecified: Secondary | ICD-10-CM | POA: Diagnosis not present

## 2019-02-02 DIAGNOSIS — Z5181 Encounter for therapeutic drug level monitoring: Secondary | ICD-10-CM | POA: Diagnosis not present

## 2019-02-02 DIAGNOSIS — D509 Iron deficiency anemia, unspecified: Secondary | ICD-10-CM | POA: Diagnosis not present

## 2019-02-02 DIAGNOSIS — E039 Hypothyroidism, unspecified: Secondary | ICD-10-CM | POA: Diagnosis not present

## 2019-02-02 DIAGNOSIS — N186 End stage renal disease: Secondary | ICD-10-CM | POA: Diagnosis not present

## 2019-02-02 DIAGNOSIS — D688 Other specified coagulation defects: Secondary | ICD-10-CM | POA: Diagnosis not present

## 2019-02-02 DIAGNOSIS — N2581 Secondary hyperparathyroidism of renal origin: Secondary | ICD-10-CM | POA: Diagnosis not present

## 2019-02-03 ENCOUNTER — Other Ambulatory Visit: Payer: Self-pay | Admitting: Interventional Cardiology

## 2019-02-04 ENCOUNTER — Other Ambulatory Visit: Payer: Self-pay

## 2019-02-04 MED ORDER — WARFARIN SODIUM 5 MG PO TABS: ORAL_TABLET | ORAL | 0 refills | Status: DC

## 2019-02-05 DIAGNOSIS — D631 Anemia in chronic kidney disease: Secondary | ICD-10-CM | POA: Diagnosis not present

## 2019-02-05 DIAGNOSIS — I482 Chronic atrial fibrillation, unspecified: Secondary | ICD-10-CM | POA: Diagnosis not present

## 2019-02-05 DIAGNOSIS — D509 Iron deficiency anemia, unspecified: Secondary | ICD-10-CM | POA: Diagnosis not present

## 2019-02-05 DIAGNOSIS — E039 Hypothyroidism, unspecified: Secondary | ICD-10-CM | POA: Diagnosis not present

## 2019-02-05 DIAGNOSIS — Z992 Dependence on renal dialysis: Secondary | ICD-10-CM | POA: Diagnosis not present

## 2019-02-05 DIAGNOSIS — N2581 Secondary hyperparathyroidism of renal origin: Secondary | ICD-10-CM | POA: Diagnosis not present

## 2019-02-05 DIAGNOSIS — D688 Other specified coagulation defects: Secondary | ICD-10-CM | POA: Diagnosis not present

## 2019-02-05 DIAGNOSIS — N186 End stage renal disease: Secondary | ICD-10-CM | POA: Diagnosis not present

## 2019-02-05 DIAGNOSIS — Z5181 Encounter for therapeutic drug level monitoring: Secondary | ICD-10-CM | POA: Diagnosis not present

## 2019-02-05 DIAGNOSIS — R51 Headache: Secondary | ICD-10-CM | POA: Diagnosis not present

## 2019-02-07 DIAGNOSIS — N186 End stage renal disease: Secondary | ICD-10-CM | POA: Diagnosis not present

## 2019-02-07 DIAGNOSIS — I482 Chronic atrial fibrillation, unspecified: Secondary | ICD-10-CM | POA: Diagnosis not present

## 2019-02-07 DIAGNOSIS — Z992 Dependence on renal dialysis: Secondary | ICD-10-CM | POA: Diagnosis not present

## 2019-02-07 DIAGNOSIS — N2581 Secondary hyperparathyroidism of renal origin: Secondary | ICD-10-CM | POA: Diagnosis not present

## 2019-02-07 DIAGNOSIS — E039 Hypothyroidism, unspecified: Secondary | ICD-10-CM | POA: Diagnosis not present

## 2019-02-07 DIAGNOSIS — Z5181 Encounter for therapeutic drug level monitoring: Secondary | ICD-10-CM | POA: Diagnosis not present

## 2019-02-07 DIAGNOSIS — R51 Headache: Secondary | ICD-10-CM | POA: Diagnosis not present

## 2019-02-07 DIAGNOSIS — D631 Anemia in chronic kidney disease: Secondary | ICD-10-CM | POA: Diagnosis not present

## 2019-02-07 DIAGNOSIS — D509 Iron deficiency anemia, unspecified: Secondary | ICD-10-CM | POA: Diagnosis not present

## 2019-02-07 DIAGNOSIS — D688 Other specified coagulation defects: Secondary | ICD-10-CM | POA: Diagnosis not present

## 2019-02-09 DIAGNOSIS — D631 Anemia in chronic kidney disease: Secondary | ICD-10-CM | POA: Diagnosis not present

## 2019-02-09 DIAGNOSIS — N186 End stage renal disease: Secondary | ICD-10-CM | POA: Diagnosis not present

## 2019-02-09 DIAGNOSIS — D688 Other specified coagulation defects: Secondary | ICD-10-CM | POA: Diagnosis not present

## 2019-02-09 DIAGNOSIS — E039 Hypothyroidism, unspecified: Secondary | ICD-10-CM | POA: Diagnosis not present

## 2019-02-09 DIAGNOSIS — Z5181 Encounter for therapeutic drug level monitoring: Secondary | ICD-10-CM | POA: Diagnosis not present

## 2019-02-09 DIAGNOSIS — Z992 Dependence on renal dialysis: Secondary | ICD-10-CM | POA: Diagnosis not present

## 2019-02-09 DIAGNOSIS — I482 Chronic atrial fibrillation, unspecified: Secondary | ICD-10-CM | POA: Diagnosis not present

## 2019-02-09 DIAGNOSIS — N2581 Secondary hyperparathyroidism of renal origin: Secondary | ICD-10-CM | POA: Diagnosis not present

## 2019-02-09 DIAGNOSIS — D509 Iron deficiency anemia, unspecified: Secondary | ICD-10-CM | POA: Diagnosis not present

## 2019-02-09 DIAGNOSIS — R51 Headache: Secondary | ICD-10-CM | POA: Diagnosis not present

## 2019-02-11 DIAGNOSIS — Z0000 Encounter for general adult medical examination without abnormal findings: Secondary | ICD-10-CM | POA: Diagnosis not present

## 2019-02-11 DIAGNOSIS — I129 Hypertensive chronic kidney disease with stage 1 through stage 4 chronic kidney disease, or unspecified chronic kidney disease: Secondary | ICD-10-CM | POA: Diagnosis not present

## 2019-02-11 DIAGNOSIS — E781 Pure hyperglyceridemia: Secondary | ICD-10-CM | POA: Diagnosis not present

## 2019-02-11 DIAGNOSIS — K219 Gastro-esophageal reflux disease without esophagitis: Secondary | ICD-10-CM | POA: Diagnosis not present

## 2019-02-12 DIAGNOSIS — Z5181 Encounter for therapeutic drug level monitoring: Secondary | ICD-10-CM | POA: Diagnosis not present

## 2019-02-12 DIAGNOSIS — R51 Headache: Secondary | ICD-10-CM | POA: Diagnosis not present

## 2019-02-12 DIAGNOSIS — D509 Iron deficiency anemia, unspecified: Secondary | ICD-10-CM | POA: Diagnosis not present

## 2019-02-12 DIAGNOSIS — D688 Other specified coagulation defects: Secondary | ICD-10-CM | POA: Diagnosis not present

## 2019-02-12 DIAGNOSIS — E039 Hypothyroidism, unspecified: Secondary | ICD-10-CM | POA: Diagnosis not present

## 2019-02-12 DIAGNOSIS — Z992 Dependence on renal dialysis: Secondary | ICD-10-CM | POA: Diagnosis not present

## 2019-02-12 DIAGNOSIS — N186 End stage renal disease: Secondary | ICD-10-CM | POA: Diagnosis not present

## 2019-02-12 DIAGNOSIS — I482 Chronic atrial fibrillation, unspecified: Secondary | ICD-10-CM | POA: Diagnosis not present

## 2019-02-12 DIAGNOSIS — N2581 Secondary hyperparathyroidism of renal origin: Secondary | ICD-10-CM | POA: Diagnosis not present

## 2019-02-12 DIAGNOSIS — D631 Anemia in chronic kidney disease: Secondary | ICD-10-CM | POA: Diagnosis not present

## 2019-02-14 DIAGNOSIS — D631 Anemia in chronic kidney disease: Secondary | ICD-10-CM | POA: Diagnosis not present

## 2019-02-14 DIAGNOSIS — Z5181 Encounter for therapeutic drug level monitoring: Secondary | ICD-10-CM | POA: Diagnosis not present

## 2019-02-14 DIAGNOSIS — E039 Hypothyroidism, unspecified: Secondary | ICD-10-CM | POA: Diagnosis not present

## 2019-02-14 DIAGNOSIS — N186 End stage renal disease: Secondary | ICD-10-CM | POA: Diagnosis not present

## 2019-02-14 DIAGNOSIS — I482 Chronic atrial fibrillation, unspecified: Secondary | ICD-10-CM | POA: Diagnosis not present

## 2019-02-14 DIAGNOSIS — Z992 Dependence on renal dialysis: Secondary | ICD-10-CM | POA: Diagnosis not present

## 2019-02-14 DIAGNOSIS — D688 Other specified coagulation defects: Secondary | ICD-10-CM | POA: Diagnosis not present

## 2019-02-14 DIAGNOSIS — R51 Headache: Secondary | ICD-10-CM | POA: Diagnosis not present

## 2019-02-14 DIAGNOSIS — D509 Iron deficiency anemia, unspecified: Secondary | ICD-10-CM | POA: Diagnosis not present

## 2019-02-14 DIAGNOSIS — N2581 Secondary hyperparathyroidism of renal origin: Secondary | ICD-10-CM | POA: Diagnosis not present

## 2019-02-16 DIAGNOSIS — I482 Chronic atrial fibrillation, unspecified: Secondary | ICD-10-CM | POA: Diagnosis not present

## 2019-02-16 DIAGNOSIS — N186 End stage renal disease: Secondary | ICD-10-CM | POA: Diagnosis not present

## 2019-02-16 DIAGNOSIS — D509 Iron deficiency anemia, unspecified: Secondary | ICD-10-CM | POA: Diagnosis not present

## 2019-02-16 DIAGNOSIS — N2581 Secondary hyperparathyroidism of renal origin: Secondary | ICD-10-CM | POA: Diagnosis not present

## 2019-02-16 DIAGNOSIS — Z992 Dependence on renal dialysis: Secondary | ICD-10-CM | POA: Diagnosis not present

## 2019-02-16 DIAGNOSIS — R51 Headache: Secondary | ICD-10-CM | POA: Diagnosis not present

## 2019-02-16 DIAGNOSIS — D631 Anemia in chronic kidney disease: Secondary | ICD-10-CM | POA: Diagnosis not present

## 2019-02-16 DIAGNOSIS — E039 Hypothyroidism, unspecified: Secondary | ICD-10-CM | POA: Diagnosis not present

## 2019-02-16 DIAGNOSIS — D688 Other specified coagulation defects: Secondary | ICD-10-CM | POA: Diagnosis not present

## 2019-02-16 DIAGNOSIS — Z5181 Encounter for therapeutic drug level monitoring: Secondary | ICD-10-CM | POA: Diagnosis not present

## 2019-02-18 DIAGNOSIS — Z992 Dependence on renal dialysis: Secondary | ICD-10-CM | POA: Diagnosis not present

## 2019-02-18 DIAGNOSIS — I129 Hypertensive chronic kidney disease with stage 1 through stage 4 chronic kidney disease, or unspecified chronic kidney disease: Secondary | ICD-10-CM | POA: Diagnosis not present

## 2019-02-18 DIAGNOSIS — N186 End stage renal disease: Secondary | ICD-10-CM | POA: Diagnosis not present

## 2019-02-19 DIAGNOSIS — D631 Anemia in chronic kidney disease: Secondary | ICD-10-CM | POA: Diagnosis not present

## 2019-02-19 DIAGNOSIS — E039 Hypothyroidism, unspecified: Secondary | ICD-10-CM | POA: Diagnosis not present

## 2019-02-19 DIAGNOSIS — D509 Iron deficiency anemia, unspecified: Secondary | ICD-10-CM | POA: Diagnosis not present

## 2019-02-19 DIAGNOSIS — I482 Chronic atrial fibrillation, unspecified: Secondary | ICD-10-CM | POA: Diagnosis not present

## 2019-02-19 DIAGNOSIS — Z992 Dependence on renal dialysis: Secondary | ICD-10-CM | POA: Diagnosis not present

## 2019-02-19 DIAGNOSIS — N186 End stage renal disease: Secondary | ICD-10-CM | POA: Diagnosis not present

## 2019-02-19 DIAGNOSIS — N2581 Secondary hyperparathyroidism of renal origin: Secondary | ICD-10-CM | POA: Diagnosis not present

## 2019-02-19 DIAGNOSIS — D688 Other specified coagulation defects: Secondary | ICD-10-CM | POA: Diagnosis not present

## 2019-02-19 DIAGNOSIS — Z5181 Encounter for therapeutic drug level monitoring: Secondary | ICD-10-CM | POA: Diagnosis not present

## 2019-02-21 DIAGNOSIS — D631 Anemia in chronic kidney disease: Secondary | ICD-10-CM | POA: Diagnosis not present

## 2019-02-21 DIAGNOSIS — Z5181 Encounter for therapeutic drug level monitoring: Secondary | ICD-10-CM | POA: Diagnosis not present

## 2019-02-21 DIAGNOSIS — I482 Chronic atrial fibrillation, unspecified: Secondary | ICD-10-CM | POA: Diagnosis not present

## 2019-02-21 DIAGNOSIS — D688 Other specified coagulation defects: Secondary | ICD-10-CM | POA: Diagnosis not present

## 2019-02-21 DIAGNOSIS — N186 End stage renal disease: Secondary | ICD-10-CM | POA: Diagnosis not present

## 2019-02-21 DIAGNOSIS — E039 Hypothyroidism, unspecified: Secondary | ICD-10-CM | POA: Diagnosis not present

## 2019-02-21 DIAGNOSIS — N2581 Secondary hyperparathyroidism of renal origin: Secondary | ICD-10-CM | POA: Diagnosis not present

## 2019-02-21 DIAGNOSIS — D509 Iron deficiency anemia, unspecified: Secondary | ICD-10-CM | POA: Diagnosis not present

## 2019-02-21 DIAGNOSIS — Z992 Dependence on renal dialysis: Secondary | ICD-10-CM | POA: Diagnosis not present

## 2019-02-23 DIAGNOSIS — Z992 Dependence on renal dialysis: Secondary | ICD-10-CM | POA: Diagnosis not present

## 2019-02-23 DIAGNOSIS — E039 Hypothyroidism, unspecified: Secondary | ICD-10-CM | POA: Diagnosis not present

## 2019-02-23 DIAGNOSIS — D509 Iron deficiency anemia, unspecified: Secondary | ICD-10-CM | POA: Diagnosis not present

## 2019-02-23 DIAGNOSIS — N2581 Secondary hyperparathyroidism of renal origin: Secondary | ICD-10-CM | POA: Diagnosis not present

## 2019-02-23 DIAGNOSIS — D688 Other specified coagulation defects: Secondary | ICD-10-CM | POA: Diagnosis not present

## 2019-02-23 DIAGNOSIS — I482 Chronic atrial fibrillation, unspecified: Secondary | ICD-10-CM | POA: Diagnosis not present

## 2019-02-23 DIAGNOSIS — Z5181 Encounter for therapeutic drug level monitoring: Secondary | ICD-10-CM | POA: Diagnosis not present

## 2019-02-23 DIAGNOSIS — N186 End stage renal disease: Secondary | ICD-10-CM | POA: Diagnosis not present

## 2019-02-23 DIAGNOSIS — D631 Anemia in chronic kidney disease: Secondary | ICD-10-CM | POA: Diagnosis not present

## 2019-02-26 DIAGNOSIS — I482 Chronic atrial fibrillation, unspecified: Secondary | ICD-10-CM | POA: Diagnosis not present

## 2019-02-26 DIAGNOSIS — N186 End stage renal disease: Secondary | ICD-10-CM | POA: Diagnosis not present

## 2019-02-26 DIAGNOSIS — D631 Anemia in chronic kidney disease: Secondary | ICD-10-CM | POA: Diagnosis not present

## 2019-02-26 DIAGNOSIS — Z5181 Encounter for therapeutic drug level monitoring: Secondary | ICD-10-CM | POA: Diagnosis not present

## 2019-02-26 DIAGNOSIS — N2581 Secondary hyperparathyroidism of renal origin: Secondary | ICD-10-CM | POA: Diagnosis not present

## 2019-02-26 DIAGNOSIS — Z992 Dependence on renal dialysis: Secondary | ICD-10-CM | POA: Diagnosis not present

## 2019-02-26 DIAGNOSIS — E039 Hypothyroidism, unspecified: Secondary | ICD-10-CM | POA: Diagnosis not present

## 2019-02-26 DIAGNOSIS — D688 Other specified coagulation defects: Secondary | ICD-10-CM | POA: Diagnosis not present

## 2019-02-26 DIAGNOSIS — D509 Iron deficiency anemia, unspecified: Secondary | ICD-10-CM | POA: Diagnosis not present

## 2019-02-28 ENCOUNTER — Encounter: Payer: Self-pay | Admitting: Interventional Cardiology

## 2019-02-28 DIAGNOSIS — I482 Chronic atrial fibrillation, unspecified: Secondary | ICD-10-CM | POA: Diagnosis not present

## 2019-02-28 DIAGNOSIS — N186 End stage renal disease: Secondary | ICD-10-CM | POA: Diagnosis not present

## 2019-02-28 DIAGNOSIS — Z992 Dependence on renal dialysis: Secondary | ICD-10-CM | POA: Diagnosis not present

## 2019-02-28 DIAGNOSIS — D688 Other specified coagulation defects: Secondary | ICD-10-CM | POA: Diagnosis not present

## 2019-02-28 DIAGNOSIS — D631 Anemia in chronic kidney disease: Secondary | ICD-10-CM | POA: Diagnosis not present

## 2019-02-28 DIAGNOSIS — N2581 Secondary hyperparathyroidism of renal origin: Secondary | ICD-10-CM | POA: Diagnosis not present

## 2019-02-28 DIAGNOSIS — D509 Iron deficiency anemia, unspecified: Secondary | ICD-10-CM | POA: Diagnosis not present

## 2019-02-28 DIAGNOSIS — Z5181 Encounter for therapeutic drug level monitoring: Secondary | ICD-10-CM | POA: Diagnosis not present

## 2019-02-28 DIAGNOSIS — E039 Hypothyroidism, unspecified: Secondary | ICD-10-CM | POA: Diagnosis not present

## 2019-02-28 LAB — PROTIME-INR: INR: 3 — AB (ref <=1.1)

## 2019-02-28 LAB — POCT INR

## 2019-03-02 DIAGNOSIS — N186 End stage renal disease: Secondary | ICD-10-CM | POA: Diagnosis not present

## 2019-03-02 DIAGNOSIS — Z5181 Encounter for therapeutic drug level monitoring: Secondary | ICD-10-CM | POA: Diagnosis not present

## 2019-03-02 DIAGNOSIS — D509 Iron deficiency anemia, unspecified: Secondary | ICD-10-CM | POA: Diagnosis not present

## 2019-03-02 DIAGNOSIS — E039 Hypothyroidism, unspecified: Secondary | ICD-10-CM | POA: Diagnosis not present

## 2019-03-02 DIAGNOSIS — D631 Anemia in chronic kidney disease: Secondary | ICD-10-CM | POA: Diagnosis not present

## 2019-03-02 DIAGNOSIS — N2581 Secondary hyperparathyroidism of renal origin: Secondary | ICD-10-CM | POA: Diagnosis not present

## 2019-03-02 DIAGNOSIS — I482 Chronic atrial fibrillation, unspecified: Secondary | ICD-10-CM | POA: Diagnosis not present

## 2019-03-02 DIAGNOSIS — D688 Other specified coagulation defects: Secondary | ICD-10-CM | POA: Diagnosis not present

## 2019-03-02 DIAGNOSIS — Z992 Dependence on renal dialysis: Secondary | ICD-10-CM | POA: Diagnosis not present

## 2019-03-05 ENCOUNTER — Ambulatory Visit (INDEPENDENT_AMBULATORY_CARE_PROVIDER_SITE_OTHER): Payer: Medicare Other | Admitting: Cardiology

## 2019-03-05 DIAGNOSIS — I4891 Unspecified atrial fibrillation: Secondary | ICD-10-CM

## 2019-03-05 DIAGNOSIS — D688 Other specified coagulation defects: Secondary | ICD-10-CM | POA: Diagnosis not present

## 2019-03-05 DIAGNOSIS — D509 Iron deficiency anemia, unspecified: Secondary | ICD-10-CM | POA: Diagnosis not present

## 2019-03-05 DIAGNOSIS — I482 Chronic atrial fibrillation, unspecified: Secondary | ICD-10-CM | POA: Diagnosis not present

## 2019-03-05 DIAGNOSIS — Z5181 Encounter for therapeutic drug level monitoring: Secondary | ICD-10-CM

## 2019-03-05 DIAGNOSIS — N2581 Secondary hyperparathyroidism of renal origin: Secondary | ICD-10-CM | POA: Diagnosis not present

## 2019-03-05 DIAGNOSIS — Z992 Dependence on renal dialysis: Secondary | ICD-10-CM | POA: Diagnosis not present

## 2019-03-05 DIAGNOSIS — I480 Paroxysmal atrial fibrillation: Secondary | ICD-10-CM

## 2019-03-05 DIAGNOSIS — N186 End stage renal disease: Secondary | ICD-10-CM | POA: Diagnosis not present

## 2019-03-05 DIAGNOSIS — D631 Anemia in chronic kidney disease: Secondary | ICD-10-CM | POA: Diagnosis not present

## 2019-03-05 DIAGNOSIS — E039 Hypothyroidism, unspecified: Secondary | ICD-10-CM | POA: Diagnosis not present

## 2019-03-05 NOTE — Patient Instructions (Signed)
Description   INR checked at dialysis last week, pt aware to continue taking 1/2 tablet daily except 1 tablet on Mondays, Wednesdays, and Fridays. She will keep f/u INR check in 1 month (scheduled 8 weeks from last in office INR check). Call with any new medications or if scheduled for any procedures or with any questions [phone]

## 2019-03-07 DIAGNOSIS — Z5181 Encounter for therapeutic drug level monitoring: Secondary | ICD-10-CM | POA: Diagnosis not present

## 2019-03-07 DIAGNOSIS — D631 Anemia in chronic kidney disease: Secondary | ICD-10-CM | POA: Diagnosis not present

## 2019-03-07 DIAGNOSIS — E039 Hypothyroidism, unspecified: Secondary | ICD-10-CM | POA: Diagnosis not present

## 2019-03-07 DIAGNOSIS — D509 Iron deficiency anemia, unspecified: Secondary | ICD-10-CM | POA: Diagnosis not present

## 2019-03-07 DIAGNOSIS — N186 End stage renal disease: Secondary | ICD-10-CM | POA: Diagnosis not present

## 2019-03-07 DIAGNOSIS — I482 Chronic atrial fibrillation, unspecified: Secondary | ICD-10-CM | POA: Diagnosis not present

## 2019-03-07 DIAGNOSIS — Z992 Dependence on renal dialysis: Secondary | ICD-10-CM | POA: Diagnosis not present

## 2019-03-07 DIAGNOSIS — N2581 Secondary hyperparathyroidism of renal origin: Secondary | ICD-10-CM | POA: Diagnosis not present

## 2019-03-07 DIAGNOSIS — D688 Other specified coagulation defects: Secondary | ICD-10-CM | POA: Diagnosis not present

## 2019-03-09 DIAGNOSIS — D509 Iron deficiency anemia, unspecified: Secondary | ICD-10-CM | POA: Diagnosis not present

## 2019-03-09 DIAGNOSIS — I482 Chronic atrial fibrillation, unspecified: Secondary | ICD-10-CM | POA: Diagnosis not present

## 2019-03-09 DIAGNOSIS — D688 Other specified coagulation defects: Secondary | ICD-10-CM | POA: Diagnosis not present

## 2019-03-09 DIAGNOSIS — N2581 Secondary hyperparathyroidism of renal origin: Secondary | ICD-10-CM | POA: Diagnosis not present

## 2019-03-09 DIAGNOSIS — Z5181 Encounter for therapeutic drug level monitoring: Secondary | ICD-10-CM | POA: Diagnosis not present

## 2019-03-09 DIAGNOSIS — N186 End stage renal disease: Secondary | ICD-10-CM | POA: Diagnosis not present

## 2019-03-09 DIAGNOSIS — E039 Hypothyroidism, unspecified: Secondary | ICD-10-CM | POA: Diagnosis not present

## 2019-03-09 DIAGNOSIS — Z992 Dependence on renal dialysis: Secondary | ICD-10-CM | POA: Diagnosis not present

## 2019-03-09 DIAGNOSIS — D631 Anemia in chronic kidney disease: Secondary | ICD-10-CM | POA: Diagnosis not present

## 2019-03-12 DIAGNOSIS — D688 Other specified coagulation defects: Secondary | ICD-10-CM | POA: Diagnosis not present

## 2019-03-12 DIAGNOSIS — Z5181 Encounter for therapeutic drug level monitoring: Secondary | ICD-10-CM | POA: Diagnosis not present

## 2019-03-12 DIAGNOSIS — I482 Chronic atrial fibrillation, unspecified: Secondary | ICD-10-CM | POA: Diagnosis not present

## 2019-03-12 DIAGNOSIS — N2581 Secondary hyperparathyroidism of renal origin: Secondary | ICD-10-CM | POA: Diagnosis not present

## 2019-03-12 DIAGNOSIS — E039 Hypothyroidism, unspecified: Secondary | ICD-10-CM | POA: Diagnosis not present

## 2019-03-12 DIAGNOSIS — D509 Iron deficiency anemia, unspecified: Secondary | ICD-10-CM | POA: Diagnosis not present

## 2019-03-12 DIAGNOSIS — N186 End stage renal disease: Secondary | ICD-10-CM | POA: Diagnosis not present

## 2019-03-12 DIAGNOSIS — Z992 Dependence on renal dialysis: Secondary | ICD-10-CM | POA: Diagnosis not present

## 2019-03-12 DIAGNOSIS — D631 Anemia in chronic kidney disease: Secondary | ICD-10-CM | POA: Diagnosis not present

## 2019-03-13 DIAGNOSIS — Z23 Encounter for immunization: Secondary | ICD-10-CM | POA: Diagnosis not present

## 2019-03-14 DIAGNOSIS — N186 End stage renal disease: Secondary | ICD-10-CM | POA: Diagnosis not present

## 2019-03-14 DIAGNOSIS — N2581 Secondary hyperparathyroidism of renal origin: Secondary | ICD-10-CM | POA: Diagnosis not present

## 2019-03-14 DIAGNOSIS — I482 Chronic atrial fibrillation, unspecified: Secondary | ICD-10-CM | POA: Diagnosis not present

## 2019-03-14 DIAGNOSIS — D631 Anemia in chronic kidney disease: Secondary | ICD-10-CM | POA: Diagnosis not present

## 2019-03-14 DIAGNOSIS — D688 Other specified coagulation defects: Secondary | ICD-10-CM | POA: Diagnosis not present

## 2019-03-14 DIAGNOSIS — Z992 Dependence on renal dialysis: Secondary | ICD-10-CM | POA: Diagnosis not present

## 2019-03-14 DIAGNOSIS — Z5181 Encounter for therapeutic drug level monitoring: Secondary | ICD-10-CM | POA: Diagnosis not present

## 2019-03-14 DIAGNOSIS — D509 Iron deficiency anemia, unspecified: Secondary | ICD-10-CM | POA: Diagnosis not present

## 2019-03-14 DIAGNOSIS — E039 Hypothyroidism, unspecified: Secondary | ICD-10-CM | POA: Diagnosis not present

## 2019-03-16 DIAGNOSIS — I482 Chronic atrial fibrillation, unspecified: Secondary | ICD-10-CM | POA: Diagnosis not present

## 2019-03-16 DIAGNOSIS — D509 Iron deficiency anemia, unspecified: Secondary | ICD-10-CM | POA: Diagnosis not present

## 2019-03-16 DIAGNOSIS — N186 End stage renal disease: Secondary | ICD-10-CM | POA: Diagnosis not present

## 2019-03-16 DIAGNOSIS — D631 Anemia in chronic kidney disease: Secondary | ICD-10-CM | POA: Diagnosis not present

## 2019-03-16 DIAGNOSIS — D688 Other specified coagulation defects: Secondary | ICD-10-CM | POA: Diagnosis not present

## 2019-03-16 DIAGNOSIS — N2581 Secondary hyperparathyroidism of renal origin: Secondary | ICD-10-CM | POA: Diagnosis not present

## 2019-03-16 DIAGNOSIS — Z992 Dependence on renal dialysis: Secondary | ICD-10-CM | POA: Diagnosis not present

## 2019-03-16 DIAGNOSIS — E039 Hypothyroidism, unspecified: Secondary | ICD-10-CM | POA: Diagnosis not present

## 2019-03-16 DIAGNOSIS — Z5181 Encounter for therapeutic drug level monitoring: Secondary | ICD-10-CM | POA: Diagnosis not present

## 2019-03-19 DIAGNOSIS — Z992 Dependence on renal dialysis: Secondary | ICD-10-CM | POA: Diagnosis not present

## 2019-03-19 DIAGNOSIS — N2581 Secondary hyperparathyroidism of renal origin: Secondary | ICD-10-CM | POA: Diagnosis not present

## 2019-03-19 DIAGNOSIS — D631 Anemia in chronic kidney disease: Secondary | ICD-10-CM | POA: Diagnosis not present

## 2019-03-19 DIAGNOSIS — I482 Chronic atrial fibrillation, unspecified: Secondary | ICD-10-CM | POA: Diagnosis not present

## 2019-03-19 DIAGNOSIS — D509 Iron deficiency anemia, unspecified: Secondary | ICD-10-CM | POA: Diagnosis not present

## 2019-03-19 DIAGNOSIS — Z5181 Encounter for therapeutic drug level monitoring: Secondary | ICD-10-CM | POA: Diagnosis not present

## 2019-03-19 DIAGNOSIS — N186 End stage renal disease: Secondary | ICD-10-CM | POA: Diagnosis not present

## 2019-03-19 DIAGNOSIS — D688 Other specified coagulation defects: Secondary | ICD-10-CM | POA: Diagnosis not present

## 2019-03-19 DIAGNOSIS — E039 Hypothyroidism, unspecified: Secondary | ICD-10-CM | POA: Diagnosis not present

## 2019-03-20 DIAGNOSIS — N186 End stage renal disease: Secondary | ICD-10-CM | POA: Diagnosis not present

## 2019-03-20 DIAGNOSIS — Z992 Dependence on renal dialysis: Secondary | ICD-10-CM | POA: Diagnosis not present

## 2019-03-20 DIAGNOSIS — I129 Hypertensive chronic kidney disease with stage 1 through stage 4 chronic kidney disease, or unspecified chronic kidney disease: Secondary | ICD-10-CM | POA: Diagnosis not present

## 2019-03-21 DIAGNOSIS — D688 Other specified coagulation defects: Secondary | ICD-10-CM | POA: Diagnosis not present

## 2019-03-21 DIAGNOSIS — D631 Anemia in chronic kidney disease: Secondary | ICD-10-CM | POA: Diagnosis not present

## 2019-03-21 DIAGNOSIS — I482 Chronic atrial fibrillation, unspecified: Secondary | ICD-10-CM | POA: Diagnosis not present

## 2019-03-21 DIAGNOSIS — R519 Headache, unspecified: Secondary | ICD-10-CM | POA: Diagnosis not present

## 2019-03-21 DIAGNOSIS — Z992 Dependence on renal dialysis: Secondary | ICD-10-CM | POA: Diagnosis not present

## 2019-03-21 DIAGNOSIS — D509 Iron deficiency anemia, unspecified: Secondary | ICD-10-CM | POA: Diagnosis not present

## 2019-03-21 DIAGNOSIS — N186 End stage renal disease: Secondary | ICD-10-CM | POA: Diagnosis not present

## 2019-03-21 DIAGNOSIS — N2581 Secondary hyperparathyroidism of renal origin: Secondary | ICD-10-CM | POA: Diagnosis not present

## 2019-03-23 DIAGNOSIS — R519 Headache, unspecified: Secondary | ICD-10-CM | POA: Diagnosis not present

## 2019-03-23 DIAGNOSIS — D631 Anemia in chronic kidney disease: Secondary | ICD-10-CM | POA: Diagnosis not present

## 2019-03-23 DIAGNOSIS — D509 Iron deficiency anemia, unspecified: Secondary | ICD-10-CM | POA: Diagnosis not present

## 2019-03-23 DIAGNOSIS — N2581 Secondary hyperparathyroidism of renal origin: Secondary | ICD-10-CM | POA: Diagnosis not present

## 2019-03-23 DIAGNOSIS — N186 End stage renal disease: Secondary | ICD-10-CM | POA: Diagnosis not present

## 2019-03-23 DIAGNOSIS — I482 Chronic atrial fibrillation, unspecified: Secondary | ICD-10-CM | POA: Diagnosis not present

## 2019-03-23 DIAGNOSIS — D688 Other specified coagulation defects: Secondary | ICD-10-CM | POA: Diagnosis not present

## 2019-03-23 DIAGNOSIS — Z992 Dependence on renal dialysis: Secondary | ICD-10-CM | POA: Diagnosis not present

## 2019-03-25 ENCOUNTER — Other Ambulatory Visit: Payer: Self-pay

## 2019-03-25 ENCOUNTER — Ambulatory Visit: Payer: Medicare Other | Admitting: *Deleted

## 2019-03-25 DIAGNOSIS — I480 Paroxysmal atrial fibrillation: Secondary | ICD-10-CM

## 2019-03-25 DIAGNOSIS — Z5181 Encounter for therapeutic drug level monitoring: Secondary | ICD-10-CM

## 2019-03-25 DIAGNOSIS — I4891 Unspecified atrial fibrillation: Secondary | ICD-10-CM | POA: Diagnosis not present

## 2019-03-25 LAB — POCT INR: INR: 2.6 (ref 2.0–3.0)

## 2019-03-25 NOTE — Patient Instructions (Signed)
Description   Continue taking 1/2 tablet daily except 1 tablet on Mondays, Wednesdays, and Fridays. Recheck INR 8 weeks. Call with any new medications or if scheduled for any procedures or with any questions [phone]

## 2019-03-26 DIAGNOSIS — I482 Chronic atrial fibrillation, unspecified: Secondary | ICD-10-CM | POA: Diagnosis not present

## 2019-03-26 DIAGNOSIS — Z992 Dependence on renal dialysis: Secondary | ICD-10-CM | POA: Diagnosis not present

## 2019-03-26 DIAGNOSIS — R519 Headache, unspecified: Secondary | ICD-10-CM | POA: Diagnosis not present

## 2019-03-26 DIAGNOSIS — D509 Iron deficiency anemia, unspecified: Secondary | ICD-10-CM | POA: Diagnosis not present

## 2019-03-26 DIAGNOSIS — N2581 Secondary hyperparathyroidism of renal origin: Secondary | ICD-10-CM | POA: Diagnosis not present

## 2019-03-26 DIAGNOSIS — D631 Anemia in chronic kidney disease: Secondary | ICD-10-CM | POA: Diagnosis not present

## 2019-03-26 DIAGNOSIS — N186 End stage renal disease: Secondary | ICD-10-CM | POA: Diagnosis not present

## 2019-03-26 DIAGNOSIS — D688 Other specified coagulation defects: Secondary | ICD-10-CM | POA: Diagnosis not present

## 2019-03-28 DIAGNOSIS — D688 Other specified coagulation defects: Secondary | ICD-10-CM | POA: Diagnosis not present

## 2019-03-28 DIAGNOSIS — N186 End stage renal disease: Secondary | ICD-10-CM | POA: Diagnosis not present

## 2019-03-28 DIAGNOSIS — N2581 Secondary hyperparathyroidism of renal origin: Secondary | ICD-10-CM | POA: Diagnosis not present

## 2019-03-28 DIAGNOSIS — R519 Headache, unspecified: Secondary | ICD-10-CM | POA: Diagnosis not present

## 2019-03-28 DIAGNOSIS — D631 Anemia in chronic kidney disease: Secondary | ICD-10-CM | POA: Diagnosis not present

## 2019-03-28 DIAGNOSIS — D509 Iron deficiency anemia, unspecified: Secondary | ICD-10-CM | POA: Diagnosis not present

## 2019-03-28 DIAGNOSIS — I482 Chronic atrial fibrillation, unspecified: Secondary | ICD-10-CM | POA: Diagnosis not present

## 2019-03-28 DIAGNOSIS — Z992 Dependence on renal dialysis: Secondary | ICD-10-CM | POA: Diagnosis not present

## 2019-03-30 DIAGNOSIS — D631 Anemia in chronic kidney disease: Secondary | ICD-10-CM | POA: Diagnosis not present

## 2019-03-30 DIAGNOSIS — N2581 Secondary hyperparathyroidism of renal origin: Secondary | ICD-10-CM | POA: Diagnosis not present

## 2019-03-30 DIAGNOSIS — I482 Chronic atrial fibrillation, unspecified: Secondary | ICD-10-CM | POA: Diagnosis not present

## 2019-03-30 DIAGNOSIS — D509 Iron deficiency anemia, unspecified: Secondary | ICD-10-CM | POA: Diagnosis not present

## 2019-03-30 DIAGNOSIS — Z992 Dependence on renal dialysis: Secondary | ICD-10-CM | POA: Diagnosis not present

## 2019-03-30 DIAGNOSIS — N186 End stage renal disease: Secondary | ICD-10-CM | POA: Diagnosis not present

## 2019-03-30 DIAGNOSIS — R519 Headache, unspecified: Secondary | ICD-10-CM | POA: Diagnosis not present

## 2019-03-30 DIAGNOSIS — D688 Other specified coagulation defects: Secondary | ICD-10-CM | POA: Diagnosis not present

## 2019-04-02 DIAGNOSIS — D688 Other specified coagulation defects: Secondary | ICD-10-CM | POA: Diagnosis not present

## 2019-04-02 DIAGNOSIS — Z992 Dependence on renal dialysis: Secondary | ICD-10-CM | POA: Diagnosis not present

## 2019-04-02 DIAGNOSIS — N186 End stage renal disease: Secondary | ICD-10-CM | POA: Diagnosis not present

## 2019-04-02 DIAGNOSIS — I482 Chronic atrial fibrillation, unspecified: Secondary | ICD-10-CM | POA: Diagnosis not present

## 2019-04-02 DIAGNOSIS — R519 Headache, unspecified: Secondary | ICD-10-CM | POA: Diagnosis not present

## 2019-04-02 DIAGNOSIS — D631 Anemia in chronic kidney disease: Secondary | ICD-10-CM | POA: Diagnosis not present

## 2019-04-02 DIAGNOSIS — D509 Iron deficiency anemia, unspecified: Secondary | ICD-10-CM | POA: Diagnosis not present

## 2019-04-02 DIAGNOSIS — N2581 Secondary hyperparathyroidism of renal origin: Secondary | ICD-10-CM | POA: Diagnosis not present

## 2019-04-04 DIAGNOSIS — D509 Iron deficiency anemia, unspecified: Secondary | ICD-10-CM | POA: Diagnosis not present

## 2019-04-04 DIAGNOSIS — D688 Other specified coagulation defects: Secondary | ICD-10-CM | POA: Diagnosis not present

## 2019-04-04 DIAGNOSIS — D631 Anemia in chronic kidney disease: Secondary | ICD-10-CM | POA: Diagnosis not present

## 2019-04-04 DIAGNOSIS — I482 Chronic atrial fibrillation, unspecified: Secondary | ICD-10-CM | POA: Diagnosis not present

## 2019-04-04 DIAGNOSIS — N2581 Secondary hyperparathyroidism of renal origin: Secondary | ICD-10-CM | POA: Diagnosis not present

## 2019-04-04 DIAGNOSIS — Z992 Dependence on renal dialysis: Secondary | ICD-10-CM | POA: Diagnosis not present

## 2019-04-04 DIAGNOSIS — N186 End stage renal disease: Secondary | ICD-10-CM | POA: Diagnosis not present

## 2019-04-04 DIAGNOSIS — R519 Headache, unspecified: Secondary | ICD-10-CM | POA: Diagnosis not present

## 2019-04-06 DIAGNOSIS — Z992 Dependence on renal dialysis: Secondary | ICD-10-CM | POA: Diagnosis not present

## 2019-04-06 DIAGNOSIS — R519 Headache, unspecified: Secondary | ICD-10-CM | POA: Diagnosis not present

## 2019-04-06 DIAGNOSIS — N186 End stage renal disease: Secondary | ICD-10-CM | POA: Diagnosis not present

## 2019-04-06 DIAGNOSIS — D631 Anemia in chronic kidney disease: Secondary | ICD-10-CM | POA: Diagnosis not present

## 2019-04-06 DIAGNOSIS — I482 Chronic atrial fibrillation, unspecified: Secondary | ICD-10-CM | POA: Diagnosis not present

## 2019-04-06 DIAGNOSIS — N2581 Secondary hyperparathyroidism of renal origin: Secondary | ICD-10-CM | POA: Diagnosis not present

## 2019-04-06 DIAGNOSIS — D688 Other specified coagulation defects: Secondary | ICD-10-CM | POA: Diagnosis not present

## 2019-04-06 DIAGNOSIS — D509 Iron deficiency anemia, unspecified: Secondary | ICD-10-CM | POA: Diagnosis not present

## 2019-04-08 DIAGNOSIS — T7840XA Allergy, unspecified, initial encounter: Secondary | ICD-10-CM | POA: Insufficient documentation

## 2019-04-09 DIAGNOSIS — R519 Headache, unspecified: Secondary | ICD-10-CM | POA: Diagnosis not present

## 2019-04-09 DIAGNOSIS — D509 Iron deficiency anemia, unspecified: Secondary | ICD-10-CM | POA: Diagnosis not present

## 2019-04-09 DIAGNOSIS — N2581 Secondary hyperparathyroidism of renal origin: Secondary | ICD-10-CM | POA: Diagnosis not present

## 2019-04-09 DIAGNOSIS — I482 Chronic atrial fibrillation, unspecified: Secondary | ICD-10-CM | POA: Diagnosis not present

## 2019-04-09 DIAGNOSIS — Z992 Dependence on renal dialysis: Secondary | ICD-10-CM | POA: Diagnosis not present

## 2019-04-09 DIAGNOSIS — N186 End stage renal disease: Secondary | ICD-10-CM | POA: Diagnosis not present

## 2019-04-09 DIAGNOSIS — D631 Anemia in chronic kidney disease: Secondary | ICD-10-CM | POA: Diagnosis not present

## 2019-04-09 DIAGNOSIS — D688 Other specified coagulation defects: Secondary | ICD-10-CM | POA: Diagnosis not present

## 2019-04-11 DIAGNOSIS — I482 Chronic atrial fibrillation, unspecified: Secondary | ICD-10-CM | POA: Diagnosis not present

## 2019-04-11 DIAGNOSIS — R519 Headache, unspecified: Secondary | ICD-10-CM | POA: Diagnosis not present

## 2019-04-11 DIAGNOSIS — D631 Anemia in chronic kidney disease: Secondary | ICD-10-CM | POA: Diagnosis not present

## 2019-04-11 DIAGNOSIS — Z992 Dependence on renal dialysis: Secondary | ICD-10-CM | POA: Diagnosis not present

## 2019-04-11 DIAGNOSIS — N2581 Secondary hyperparathyroidism of renal origin: Secondary | ICD-10-CM | POA: Diagnosis not present

## 2019-04-11 DIAGNOSIS — N186 End stage renal disease: Secondary | ICD-10-CM | POA: Diagnosis not present

## 2019-04-11 DIAGNOSIS — D688 Other specified coagulation defects: Secondary | ICD-10-CM | POA: Diagnosis not present

## 2019-04-11 DIAGNOSIS — D509 Iron deficiency anemia, unspecified: Secondary | ICD-10-CM | POA: Diagnosis not present

## 2019-04-13 DIAGNOSIS — R519 Headache, unspecified: Secondary | ICD-10-CM | POA: Diagnosis not present

## 2019-04-13 DIAGNOSIS — I482 Chronic atrial fibrillation, unspecified: Secondary | ICD-10-CM | POA: Diagnosis not present

## 2019-04-13 DIAGNOSIS — D631 Anemia in chronic kidney disease: Secondary | ICD-10-CM | POA: Diagnosis not present

## 2019-04-13 DIAGNOSIS — D688 Other specified coagulation defects: Secondary | ICD-10-CM | POA: Diagnosis not present

## 2019-04-13 DIAGNOSIS — D509 Iron deficiency anemia, unspecified: Secondary | ICD-10-CM | POA: Diagnosis not present

## 2019-04-13 DIAGNOSIS — N186 End stage renal disease: Secondary | ICD-10-CM | POA: Diagnosis not present

## 2019-04-13 DIAGNOSIS — N2581 Secondary hyperparathyroidism of renal origin: Secondary | ICD-10-CM | POA: Diagnosis not present

## 2019-04-13 DIAGNOSIS — Z992 Dependence on renal dialysis: Secondary | ICD-10-CM | POA: Diagnosis not present

## 2019-04-16 DIAGNOSIS — N2581 Secondary hyperparathyroidism of renal origin: Secondary | ICD-10-CM | POA: Diagnosis not present

## 2019-04-16 DIAGNOSIS — Z992 Dependence on renal dialysis: Secondary | ICD-10-CM | POA: Diagnosis not present

## 2019-04-16 DIAGNOSIS — D509 Iron deficiency anemia, unspecified: Secondary | ICD-10-CM | POA: Diagnosis not present

## 2019-04-16 DIAGNOSIS — N186 End stage renal disease: Secondary | ICD-10-CM | POA: Diagnosis not present

## 2019-04-16 DIAGNOSIS — I482 Chronic atrial fibrillation, unspecified: Secondary | ICD-10-CM | POA: Diagnosis not present

## 2019-04-16 DIAGNOSIS — D631 Anemia in chronic kidney disease: Secondary | ICD-10-CM | POA: Diagnosis not present

## 2019-04-16 DIAGNOSIS — R519 Headache, unspecified: Secondary | ICD-10-CM | POA: Diagnosis not present

## 2019-04-16 DIAGNOSIS — D688 Other specified coagulation defects: Secondary | ICD-10-CM | POA: Diagnosis not present

## 2019-04-18 DIAGNOSIS — Z992 Dependence on renal dialysis: Secondary | ICD-10-CM | POA: Diagnosis not present

## 2019-04-18 DIAGNOSIS — N2581 Secondary hyperparathyroidism of renal origin: Secondary | ICD-10-CM | POA: Diagnosis not present

## 2019-04-18 DIAGNOSIS — I482 Chronic atrial fibrillation, unspecified: Secondary | ICD-10-CM | POA: Diagnosis not present

## 2019-04-18 DIAGNOSIS — D509 Iron deficiency anemia, unspecified: Secondary | ICD-10-CM | POA: Diagnosis not present

## 2019-04-18 DIAGNOSIS — R519 Headache, unspecified: Secondary | ICD-10-CM | POA: Diagnosis not present

## 2019-04-18 DIAGNOSIS — N186 End stage renal disease: Secondary | ICD-10-CM | POA: Diagnosis not present

## 2019-04-18 DIAGNOSIS — D631 Anemia in chronic kidney disease: Secondary | ICD-10-CM | POA: Diagnosis not present

## 2019-04-18 DIAGNOSIS — D688 Other specified coagulation defects: Secondary | ICD-10-CM | POA: Diagnosis not present

## 2019-04-20 DIAGNOSIS — D631 Anemia in chronic kidney disease: Secondary | ICD-10-CM | POA: Diagnosis not present

## 2019-04-20 DIAGNOSIS — Z992 Dependence on renal dialysis: Secondary | ICD-10-CM | POA: Diagnosis not present

## 2019-04-20 DIAGNOSIS — D509 Iron deficiency anemia, unspecified: Secondary | ICD-10-CM | POA: Diagnosis not present

## 2019-04-20 DIAGNOSIS — N186 End stage renal disease: Secondary | ICD-10-CM | POA: Diagnosis not present

## 2019-04-20 DIAGNOSIS — I482 Chronic atrial fibrillation, unspecified: Secondary | ICD-10-CM | POA: Diagnosis not present

## 2019-04-20 DIAGNOSIS — D688 Other specified coagulation defects: Secondary | ICD-10-CM | POA: Diagnosis not present

## 2019-04-20 DIAGNOSIS — I129 Hypertensive chronic kidney disease with stage 1 through stage 4 chronic kidney disease, or unspecified chronic kidney disease: Secondary | ICD-10-CM | POA: Diagnosis not present

## 2019-04-20 DIAGNOSIS — R519 Headache, unspecified: Secondary | ICD-10-CM | POA: Diagnosis not present

## 2019-04-20 DIAGNOSIS — N2581 Secondary hyperparathyroidism of renal origin: Secondary | ICD-10-CM | POA: Diagnosis not present

## 2019-04-23 DIAGNOSIS — Z5181 Encounter for therapeutic drug level monitoring: Secondary | ICD-10-CM | POA: Diagnosis not present

## 2019-04-23 DIAGNOSIS — N2581 Secondary hyperparathyroidism of renal origin: Secondary | ICD-10-CM | POA: Diagnosis not present

## 2019-04-23 DIAGNOSIS — E039 Hypothyroidism, unspecified: Secondary | ICD-10-CM | POA: Diagnosis not present

## 2019-04-23 DIAGNOSIS — R519 Headache, unspecified: Secondary | ICD-10-CM | POA: Diagnosis not present

## 2019-04-23 DIAGNOSIS — N186 End stage renal disease: Secondary | ICD-10-CM | POA: Diagnosis not present

## 2019-04-23 DIAGNOSIS — D631 Anemia in chronic kidney disease: Secondary | ICD-10-CM | POA: Diagnosis not present

## 2019-04-23 DIAGNOSIS — R197 Diarrhea, unspecified: Secondary | ICD-10-CM | POA: Diagnosis not present

## 2019-04-23 DIAGNOSIS — Z992 Dependence on renal dialysis: Secondary | ICD-10-CM | POA: Diagnosis not present

## 2019-04-23 DIAGNOSIS — I482 Chronic atrial fibrillation, unspecified: Secondary | ICD-10-CM | POA: Diagnosis not present

## 2019-04-23 DIAGNOSIS — D688 Other specified coagulation defects: Secondary | ICD-10-CM | POA: Diagnosis not present

## 2019-04-25 DIAGNOSIS — N2581 Secondary hyperparathyroidism of renal origin: Secondary | ICD-10-CM | POA: Diagnosis not present

## 2019-04-25 DIAGNOSIS — E039 Hypothyroidism, unspecified: Secondary | ICD-10-CM | POA: Diagnosis not present

## 2019-04-25 DIAGNOSIS — D688 Other specified coagulation defects: Secondary | ICD-10-CM | POA: Diagnosis not present

## 2019-04-25 DIAGNOSIS — I482 Chronic atrial fibrillation, unspecified: Secondary | ICD-10-CM | POA: Diagnosis not present

## 2019-04-25 DIAGNOSIS — R519 Headache, unspecified: Secondary | ICD-10-CM | POA: Diagnosis not present

## 2019-04-25 DIAGNOSIS — Z992 Dependence on renal dialysis: Secondary | ICD-10-CM | POA: Diagnosis not present

## 2019-04-25 DIAGNOSIS — D631 Anemia in chronic kidney disease: Secondary | ICD-10-CM | POA: Diagnosis not present

## 2019-04-25 DIAGNOSIS — Z5181 Encounter for therapeutic drug level monitoring: Secondary | ICD-10-CM | POA: Diagnosis not present

## 2019-04-25 DIAGNOSIS — R197 Diarrhea, unspecified: Secondary | ICD-10-CM | POA: Diagnosis not present

## 2019-04-25 DIAGNOSIS — N186 End stage renal disease: Secondary | ICD-10-CM | POA: Diagnosis not present

## 2019-04-27 DIAGNOSIS — D631 Anemia in chronic kidney disease: Secondary | ICD-10-CM | POA: Diagnosis not present

## 2019-04-27 DIAGNOSIS — N186 End stage renal disease: Secondary | ICD-10-CM | POA: Diagnosis not present

## 2019-04-27 DIAGNOSIS — E039 Hypothyroidism, unspecified: Secondary | ICD-10-CM | POA: Diagnosis not present

## 2019-04-27 DIAGNOSIS — R197 Diarrhea, unspecified: Secondary | ICD-10-CM | POA: Diagnosis not present

## 2019-04-27 DIAGNOSIS — Z5181 Encounter for therapeutic drug level monitoring: Secondary | ICD-10-CM | POA: Diagnosis not present

## 2019-04-27 DIAGNOSIS — R519 Headache, unspecified: Secondary | ICD-10-CM | POA: Diagnosis not present

## 2019-04-27 DIAGNOSIS — I482 Chronic atrial fibrillation, unspecified: Secondary | ICD-10-CM | POA: Diagnosis not present

## 2019-04-27 DIAGNOSIS — N2581 Secondary hyperparathyroidism of renal origin: Secondary | ICD-10-CM | POA: Diagnosis not present

## 2019-04-27 DIAGNOSIS — Z992 Dependence on renal dialysis: Secondary | ICD-10-CM | POA: Diagnosis not present

## 2019-04-27 DIAGNOSIS — D688 Other specified coagulation defects: Secondary | ICD-10-CM | POA: Diagnosis not present

## 2019-04-30 DIAGNOSIS — R197 Diarrhea, unspecified: Secondary | ICD-10-CM | POA: Diagnosis not present

## 2019-04-30 DIAGNOSIS — N2581 Secondary hyperparathyroidism of renal origin: Secondary | ICD-10-CM | POA: Diagnosis not present

## 2019-04-30 DIAGNOSIS — D688 Other specified coagulation defects: Secondary | ICD-10-CM | POA: Diagnosis not present

## 2019-04-30 DIAGNOSIS — N186 End stage renal disease: Secondary | ICD-10-CM | POA: Diagnosis not present

## 2019-04-30 DIAGNOSIS — D631 Anemia in chronic kidney disease: Secondary | ICD-10-CM | POA: Diagnosis not present

## 2019-04-30 DIAGNOSIS — R519 Headache, unspecified: Secondary | ICD-10-CM | POA: Diagnosis not present

## 2019-04-30 DIAGNOSIS — Z992 Dependence on renal dialysis: Secondary | ICD-10-CM | POA: Diagnosis not present

## 2019-04-30 DIAGNOSIS — Z5181 Encounter for therapeutic drug level monitoring: Secondary | ICD-10-CM | POA: Diagnosis not present

## 2019-04-30 DIAGNOSIS — I482 Chronic atrial fibrillation, unspecified: Secondary | ICD-10-CM | POA: Diagnosis not present

## 2019-04-30 DIAGNOSIS — E039 Hypothyroidism, unspecified: Secondary | ICD-10-CM | POA: Diagnosis not present

## 2019-05-02 DIAGNOSIS — R197 Diarrhea, unspecified: Secondary | ICD-10-CM | POA: Diagnosis not present

## 2019-05-02 DIAGNOSIS — I482 Chronic atrial fibrillation, unspecified: Secondary | ICD-10-CM | POA: Diagnosis not present

## 2019-05-02 DIAGNOSIS — N186 End stage renal disease: Secondary | ICD-10-CM | POA: Diagnosis not present

## 2019-05-02 DIAGNOSIS — R519 Headache, unspecified: Secondary | ICD-10-CM | POA: Diagnosis not present

## 2019-05-02 DIAGNOSIS — Z992 Dependence on renal dialysis: Secondary | ICD-10-CM | POA: Diagnosis not present

## 2019-05-02 DIAGNOSIS — D631 Anemia in chronic kidney disease: Secondary | ICD-10-CM | POA: Diagnosis not present

## 2019-05-02 DIAGNOSIS — Z5181 Encounter for therapeutic drug level monitoring: Secondary | ICD-10-CM | POA: Diagnosis not present

## 2019-05-02 DIAGNOSIS — N2581 Secondary hyperparathyroidism of renal origin: Secondary | ICD-10-CM | POA: Diagnosis not present

## 2019-05-02 DIAGNOSIS — D688 Other specified coagulation defects: Secondary | ICD-10-CM | POA: Diagnosis not present

## 2019-05-02 DIAGNOSIS — E039 Hypothyroidism, unspecified: Secondary | ICD-10-CM | POA: Diagnosis not present

## 2019-05-04 DIAGNOSIS — Z992 Dependence on renal dialysis: Secondary | ICD-10-CM | POA: Diagnosis not present

## 2019-05-04 DIAGNOSIS — E039 Hypothyroidism, unspecified: Secondary | ICD-10-CM | POA: Diagnosis not present

## 2019-05-04 DIAGNOSIS — R197 Diarrhea, unspecified: Secondary | ICD-10-CM | POA: Diagnosis not present

## 2019-05-04 DIAGNOSIS — D688 Other specified coagulation defects: Secondary | ICD-10-CM | POA: Diagnosis not present

## 2019-05-04 DIAGNOSIS — I482 Chronic atrial fibrillation, unspecified: Secondary | ICD-10-CM | POA: Diagnosis not present

## 2019-05-04 DIAGNOSIS — N2581 Secondary hyperparathyroidism of renal origin: Secondary | ICD-10-CM | POA: Diagnosis not present

## 2019-05-04 DIAGNOSIS — Z5181 Encounter for therapeutic drug level monitoring: Secondary | ICD-10-CM | POA: Diagnosis not present

## 2019-05-04 DIAGNOSIS — N186 End stage renal disease: Secondary | ICD-10-CM | POA: Diagnosis not present

## 2019-05-04 DIAGNOSIS — R519 Headache, unspecified: Secondary | ICD-10-CM | POA: Diagnosis not present

## 2019-05-04 DIAGNOSIS — D631 Anemia in chronic kidney disease: Secondary | ICD-10-CM | POA: Diagnosis not present

## 2019-05-07 DIAGNOSIS — N2581 Secondary hyperparathyroidism of renal origin: Secondary | ICD-10-CM | POA: Diagnosis not present

## 2019-05-07 DIAGNOSIS — R519 Headache, unspecified: Secondary | ICD-10-CM | POA: Diagnosis not present

## 2019-05-07 DIAGNOSIS — Z992 Dependence on renal dialysis: Secondary | ICD-10-CM | POA: Diagnosis not present

## 2019-05-07 DIAGNOSIS — I482 Chronic atrial fibrillation, unspecified: Secondary | ICD-10-CM | POA: Diagnosis not present

## 2019-05-07 DIAGNOSIS — N186 End stage renal disease: Secondary | ICD-10-CM | POA: Diagnosis not present

## 2019-05-07 DIAGNOSIS — D688 Other specified coagulation defects: Secondary | ICD-10-CM | POA: Diagnosis not present

## 2019-05-07 DIAGNOSIS — E039 Hypothyroidism, unspecified: Secondary | ICD-10-CM | POA: Diagnosis not present

## 2019-05-07 DIAGNOSIS — Z5181 Encounter for therapeutic drug level monitoring: Secondary | ICD-10-CM | POA: Diagnosis not present

## 2019-05-07 DIAGNOSIS — R197 Diarrhea, unspecified: Secondary | ICD-10-CM | POA: Diagnosis not present

## 2019-05-07 DIAGNOSIS — D631 Anemia in chronic kidney disease: Secondary | ICD-10-CM | POA: Diagnosis not present

## 2019-05-09 DIAGNOSIS — R519 Headache, unspecified: Secondary | ICD-10-CM | POA: Diagnosis not present

## 2019-05-09 DIAGNOSIS — Z5181 Encounter for therapeutic drug level monitoring: Secondary | ICD-10-CM | POA: Diagnosis not present

## 2019-05-09 DIAGNOSIS — E039 Hypothyroidism, unspecified: Secondary | ICD-10-CM | POA: Diagnosis not present

## 2019-05-09 DIAGNOSIS — Z992 Dependence on renal dialysis: Secondary | ICD-10-CM | POA: Diagnosis not present

## 2019-05-09 DIAGNOSIS — N186 End stage renal disease: Secondary | ICD-10-CM | POA: Diagnosis not present

## 2019-05-09 DIAGNOSIS — D631 Anemia in chronic kidney disease: Secondary | ICD-10-CM | POA: Diagnosis not present

## 2019-05-09 DIAGNOSIS — I482 Chronic atrial fibrillation, unspecified: Secondary | ICD-10-CM | POA: Diagnosis not present

## 2019-05-09 DIAGNOSIS — D688 Other specified coagulation defects: Secondary | ICD-10-CM | POA: Diagnosis not present

## 2019-05-09 DIAGNOSIS — R197 Diarrhea, unspecified: Secondary | ICD-10-CM | POA: Diagnosis not present

## 2019-05-09 DIAGNOSIS — N2581 Secondary hyperparathyroidism of renal origin: Secondary | ICD-10-CM | POA: Diagnosis not present

## 2019-05-11 DIAGNOSIS — R519 Headache, unspecified: Secondary | ICD-10-CM | POA: Diagnosis not present

## 2019-05-11 DIAGNOSIS — I482 Chronic atrial fibrillation, unspecified: Secondary | ICD-10-CM | POA: Diagnosis not present

## 2019-05-11 DIAGNOSIS — N186 End stage renal disease: Secondary | ICD-10-CM | POA: Diagnosis not present

## 2019-05-11 DIAGNOSIS — Z5181 Encounter for therapeutic drug level monitoring: Secondary | ICD-10-CM | POA: Diagnosis not present

## 2019-05-11 DIAGNOSIS — R197 Diarrhea, unspecified: Secondary | ICD-10-CM | POA: Diagnosis not present

## 2019-05-11 DIAGNOSIS — D688 Other specified coagulation defects: Secondary | ICD-10-CM | POA: Diagnosis not present

## 2019-05-11 DIAGNOSIS — E039 Hypothyroidism, unspecified: Secondary | ICD-10-CM | POA: Diagnosis not present

## 2019-05-11 DIAGNOSIS — Z992 Dependence on renal dialysis: Secondary | ICD-10-CM | POA: Diagnosis not present

## 2019-05-11 DIAGNOSIS — N2581 Secondary hyperparathyroidism of renal origin: Secondary | ICD-10-CM | POA: Diagnosis not present

## 2019-05-11 DIAGNOSIS — D631 Anemia in chronic kidney disease: Secondary | ICD-10-CM | POA: Diagnosis not present

## 2019-05-13 DIAGNOSIS — N186 End stage renal disease: Secondary | ICD-10-CM | POA: Diagnosis not present

## 2019-05-13 DIAGNOSIS — I482 Chronic atrial fibrillation, unspecified: Secondary | ICD-10-CM | POA: Diagnosis not present

## 2019-05-13 DIAGNOSIS — N2581 Secondary hyperparathyroidism of renal origin: Secondary | ICD-10-CM | POA: Diagnosis not present

## 2019-05-13 DIAGNOSIS — E039 Hypothyroidism, unspecified: Secondary | ICD-10-CM | POA: Diagnosis not present

## 2019-05-13 DIAGNOSIS — R519 Headache, unspecified: Secondary | ICD-10-CM | POA: Diagnosis not present

## 2019-05-13 DIAGNOSIS — Z5181 Encounter for therapeutic drug level monitoring: Secondary | ICD-10-CM | POA: Diagnosis not present

## 2019-05-13 DIAGNOSIS — R197 Diarrhea, unspecified: Secondary | ICD-10-CM | POA: Diagnosis not present

## 2019-05-13 DIAGNOSIS — D631 Anemia in chronic kidney disease: Secondary | ICD-10-CM | POA: Diagnosis not present

## 2019-05-13 DIAGNOSIS — D688 Other specified coagulation defects: Secondary | ICD-10-CM | POA: Diagnosis not present

## 2019-05-13 DIAGNOSIS — Z992 Dependence on renal dialysis: Secondary | ICD-10-CM | POA: Diagnosis not present

## 2019-05-15 ENCOUNTER — Other Ambulatory Visit: Payer: Self-pay | Admitting: Pharmacist

## 2019-05-15 DIAGNOSIS — R519 Headache, unspecified: Secondary | ICD-10-CM | POA: Diagnosis not present

## 2019-05-15 DIAGNOSIS — Z992 Dependence on renal dialysis: Secondary | ICD-10-CM | POA: Diagnosis not present

## 2019-05-15 DIAGNOSIS — N186 End stage renal disease: Secondary | ICD-10-CM | POA: Diagnosis not present

## 2019-05-15 DIAGNOSIS — E039 Hypothyroidism, unspecified: Secondary | ICD-10-CM | POA: Diagnosis not present

## 2019-05-15 DIAGNOSIS — Z5181 Encounter for therapeutic drug level monitoring: Secondary | ICD-10-CM | POA: Diagnosis not present

## 2019-05-15 DIAGNOSIS — R197 Diarrhea, unspecified: Secondary | ICD-10-CM | POA: Diagnosis not present

## 2019-05-15 DIAGNOSIS — D631 Anemia in chronic kidney disease: Secondary | ICD-10-CM | POA: Diagnosis not present

## 2019-05-15 DIAGNOSIS — I482 Chronic atrial fibrillation, unspecified: Secondary | ICD-10-CM | POA: Diagnosis not present

## 2019-05-15 DIAGNOSIS — D688 Other specified coagulation defects: Secondary | ICD-10-CM | POA: Diagnosis not present

## 2019-05-15 DIAGNOSIS — N2581 Secondary hyperparathyroidism of renal origin: Secondary | ICD-10-CM | POA: Diagnosis not present

## 2019-05-15 MED ORDER — WARFARIN SODIUM 5 MG PO TABS: ORAL_TABLET | ORAL | 0 refills | Status: DC

## 2019-05-18 DIAGNOSIS — R197 Diarrhea, unspecified: Secondary | ICD-10-CM | POA: Diagnosis not present

## 2019-05-18 DIAGNOSIS — N2581 Secondary hyperparathyroidism of renal origin: Secondary | ICD-10-CM | POA: Diagnosis not present

## 2019-05-18 DIAGNOSIS — D631 Anemia in chronic kidney disease: Secondary | ICD-10-CM | POA: Diagnosis not present

## 2019-05-18 DIAGNOSIS — Z992 Dependence on renal dialysis: Secondary | ICD-10-CM | POA: Diagnosis not present

## 2019-05-18 DIAGNOSIS — Z5181 Encounter for therapeutic drug level monitoring: Secondary | ICD-10-CM | POA: Diagnosis not present

## 2019-05-18 DIAGNOSIS — D688 Other specified coagulation defects: Secondary | ICD-10-CM | POA: Diagnosis not present

## 2019-05-18 DIAGNOSIS — N186 End stage renal disease: Secondary | ICD-10-CM | POA: Diagnosis not present

## 2019-05-18 DIAGNOSIS — E039 Hypothyroidism, unspecified: Secondary | ICD-10-CM | POA: Diagnosis not present

## 2019-05-18 DIAGNOSIS — R519 Headache, unspecified: Secondary | ICD-10-CM | POA: Diagnosis not present

## 2019-05-18 DIAGNOSIS — I482 Chronic atrial fibrillation, unspecified: Secondary | ICD-10-CM | POA: Diagnosis not present

## 2019-05-20 DIAGNOSIS — I129 Hypertensive chronic kidney disease with stage 1 through stage 4 chronic kidney disease, or unspecified chronic kidney disease: Secondary | ICD-10-CM | POA: Diagnosis not present

## 2019-05-20 DIAGNOSIS — N186 End stage renal disease: Secondary | ICD-10-CM | POA: Diagnosis not present

## 2019-05-20 DIAGNOSIS — Z992 Dependence on renal dialysis: Secondary | ICD-10-CM | POA: Diagnosis not present

## 2019-05-21 DIAGNOSIS — D631 Anemia in chronic kidney disease: Secondary | ICD-10-CM | POA: Diagnosis not present

## 2019-05-21 DIAGNOSIS — D509 Iron deficiency anemia, unspecified: Secondary | ICD-10-CM | POA: Diagnosis not present

## 2019-05-21 DIAGNOSIS — N2581 Secondary hyperparathyroidism of renal origin: Secondary | ICD-10-CM | POA: Diagnosis not present

## 2019-05-21 DIAGNOSIS — R519 Headache, unspecified: Secondary | ICD-10-CM | POA: Diagnosis not present

## 2019-05-21 DIAGNOSIS — I482 Chronic atrial fibrillation, unspecified: Secondary | ICD-10-CM | POA: Diagnosis not present

## 2019-05-21 DIAGNOSIS — N186 End stage renal disease: Secondary | ICD-10-CM | POA: Diagnosis not present

## 2019-05-21 DIAGNOSIS — D688 Other specified coagulation defects: Secondary | ICD-10-CM | POA: Diagnosis not present

## 2019-05-21 DIAGNOSIS — Z79899 Other long term (current) drug therapy: Secondary | ICD-10-CM | POA: Diagnosis not present

## 2019-05-21 DIAGNOSIS — Z992 Dependence on renal dialysis: Secondary | ICD-10-CM | POA: Diagnosis not present

## 2019-05-21 DIAGNOSIS — R197 Diarrhea, unspecified: Secondary | ICD-10-CM | POA: Diagnosis not present

## 2019-05-22 ENCOUNTER — Other Ambulatory Visit: Payer: Self-pay | Admitting: Geriatric Medicine

## 2019-05-22 DIAGNOSIS — Z1231 Encounter for screening mammogram for malignant neoplasm of breast: Secondary | ICD-10-CM

## 2019-05-23 DIAGNOSIS — R519 Headache, unspecified: Secondary | ICD-10-CM | POA: Diagnosis not present

## 2019-05-23 DIAGNOSIS — Z992 Dependence on renal dialysis: Secondary | ICD-10-CM | POA: Diagnosis not present

## 2019-05-23 DIAGNOSIS — N186 End stage renal disease: Secondary | ICD-10-CM | POA: Diagnosis not present

## 2019-05-23 DIAGNOSIS — D688 Other specified coagulation defects: Secondary | ICD-10-CM | POA: Diagnosis not present

## 2019-05-23 DIAGNOSIS — D631 Anemia in chronic kidney disease: Secondary | ICD-10-CM | POA: Diagnosis not present

## 2019-05-23 DIAGNOSIS — D509 Iron deficiency anemia, unspecified: Secondary | ICD-10-CM | POA: Diagnosis not present

## 2019-05-23 DIAGNOSIS — R197 Diarrhea, unspecified: Secondary | ICD-10-CM | POA: Diagnosis not present

## 2019-05-23 DIAGNOSIS — I482 Chronic atrial fibrillation, unspecified: Secondary | ICD-10-CM | POA: Diagnosis not present

## 2019-05-23 DIAGNOSIS — N2581 Secondary hyperparathyroidism of renal origin: Secondary | ICD-10-CM | POA: Diagnosis not present

## 2019-05-23 DIAGNOSIS — Z79899 Other long term (current) drug therapy: Secondary | ICD-10-CM | POA: Diagnosis not present

## 2019-05-24 DIAGNOSIS — M25562 Pain in left knee: Secondary | ICD-10-CM | POA: Diagnosis not present

## 2019-05-25 DIAGNOSIS — I482 Chronic atrial fibrillation, unspecified: Secondary | ICD-10-CM | POA: Diagnosis not present

## 2019-05-25 DIAGNOSIS — D688 Other specified coagulation defects: Secondary | ICD-10-CM | POA: Diagnosis not present

## 2019-05-25 DIAGNOSIS — R519 Headache, unspecified: Secondary | ICD-10-CM | POA: Diagnosis not present

## 2019-05-25 DIAGNOSIS — R197 Diarrhea, unspecified: Secondary | ICD-10-CM | POA: Diagnosis not present

## 2019-05-25 DIAGNOSIS — N2581 Secondary hyperparathyroidism of renal origin: Secondary | ICD-10-CM | POA: Diagnosis not present

## 2019-05-25 DIAGNOSIS — D509 Iron deficiency anemia, unspecified: Secondary | ICD-10-CM | POA: Diagnosis not present

## 2019-05-25 DIAGNOSIS — D631 Anemia in chronic kidney disease: Secondary | ICD-10-CM | POA: Diagnosis not present

## 2019-05-25 DIAGNOSIS — N186 End stage renal disease: Secondary | ICD-10-CM | POA: Diagnosis not present

## 2019-05-25 DIAGNOSIS — Z79899 Other long term (current) drug therapy: Secondary | ICD-10-CM | POA: Diagnosis not present

## 2019-05-25 DIAGNOSIS — Z992 Dependence on renal dialysis: Secondary | ICD-10-CM | POA: Diagnosis not present

## 2019-05-25 NOTE — Progress Notes (Signed)
Cardiology Office Note   Date:  05/27/2019   ID:  [name], DOB [birth year], MRN [id number]  PCP:  Lajean Manes, MD    No chief complaint on file.  PAF  Wt Readings from Last 3 Encounters:  05/27/19 184 lb 12.8 oz (83.8 kg)  08/27/18 188 lb 7.9 oz (85.5 kg)  04/20/18 192 lb (87.1 kg)       History of Present Illness: [name] is a 73 y.o. female   with a hx of Dilated cardiomyopathy with EF 20-25% in the setting of sepsis in 9937 complicated by demand ischemia and transient atrial fibrillation. She had an abnormal stress test that prompted cardiac catheterization which demonstrated normal coronary arteries in 2014. Other history includes HL, HTN, diastolic HF, ESRD on hemodialysis, recurrent sepsis status post percutaneous nephrostomy, chronic anemia.  She progressed to [facility] 2017.Admitted 6/2-6/5/17with chest pain. CXR showed moderate L pleural effusion and trace R pleural effusion new from prior. CT without contrast showed left lung base consolidation with layering pleural effusion favored to represent pneumonia, with small but intermediate density pericardial effusion new since November (consider pericarditis), chronic large gastric hiatal hernia, calcified coronary atherosclerosis. Albumin was 2.3. Cardiac enzymes were negative. D-dimer was elevated at 3.25. Patient was treated with antibiotics and underwent left thoracentesis. She was followed by pulmonology. Pleural effusion was transudative. She developed AF with RVR in the hospital and was seen by cardiology. Repeat echo demonstrated normal LV function and possible trivial pericardial effusion. She was loaded with amiodarone for an attempt at rhythm control. She returned to NSR.  Effusion resolved on subsequent echo.  Echo in 9/18: normal LV function with mild AS. No effusion.   Denies : Chest pain. Dizziness. Leg edema. Nitroglycerin use. Orthopnea. Palpitations. Paroxysmal nocturnal dyspnea.  Shortness of breath. Syncope.   No cardiac issues with dialysis. She has leg cramps with dialysis, towards the end of the sessions. She has some knee problems and will be seeing ortho.  Walking limited by knee pain.  Past Medical History:  Diagnosis Date   Cardiomyopathy ([place])    a. h/o LV dysfunction EF 20-25% in 2013 due to sepsis.>> improved to normal    Chronic diastolic CHF (congestive heart failure) ([place])    10/ 2013 in setting of septic shock   Complication of anesthesia    use a little anesthesia , per patient MD states she quit breathing (2016); hard to wake up   ESRD (end stage renal disease) ([place])    dialysis Tues, Thurs, Sat [address], sees dr deterding    GERD (gastroesophageal reflux disease)    Glaucoma    both eyes   H/O hiatal hernia    a. CT 2017: large gastric hiatal hernia.   Headache(784.0)    migraine hx of   History of blood transfusion 04/13/2015    History of echocardiogram    a. Echo 6/17: EF 60-65%, normal wall motion, mild MR, atrial septal lipomatous hypertrophy, PASP 34 mmHg, possible trivial free-flowing pericardial effusion along RV free wall // b. Echo 5/17: Mild LVH, EF 55-60%, normal wall motion, grade 1 diastolic dysfunction, trivial MR, severe LAE, mild RAE, PASP 42 mmHg   History of nephrostomy 04/11/2015   currently inplace 04/28/2015  removed now   History of nuclear stress test    a. Myoview 1/14 - Marked ischemia in the basal anterior, mid anterior, apical septal and apical inferior regions, EF 63% >> LHC normal    Hyperlipidemia  Hypertension    medication removed from regimen due to low blood pressure    Iron deficiency    hx   Nephrolithiasis 2002, 2006   bilateral   Normal coronary arteries 2014   a. LHC in 1/14: normal coronary arteries   PAF (paroxysmal atrial fibrillation) ([place])    a. 10/ 2013  in setting of Septic Shock //  b. recurrent during admit for pneumonia, L effusion >> placed on Amiodarone //  Coumadin for anticoagulation   Pneumonia jan 2018, last tme lungs clear now   dx 10-06-2014 per CXR--  on 10-27-2014 pt states finished antibiotic and denies cough or fever   S/P hemodialysis catheter insertion ([place]) 04/11/2015    right anterior chest , only used once    Sigmoid diverticulosis    UTI (urinary tract infection) 05/10/2016    Past Surgical History:  Procedure Laterality Date   AV FISTULA PLACEMENT Left 06/02/2015   Procedure: BRACHIOCEPHALIC ARTERIOVENOUS (AV) FISTULA CREATION ;  Surgeon: Conrad Owaneco, MD;  Location: [place];  Service: Vascular;  Laterality: Left;   [place] Left 07/27/2015   Procedure: FIRST STAGE BASILIC VEIN TRANSPOSITION LEFT UPPER ARM;  Surgeon: Conrad Nikiski, MD;  Location: [place];  Service: Vascular;  Laterality: Left;   [place] Left 09/2015   second phase   [place] Left 10/12/2015   Procedure: SECOND STAGE BASILIC VEIN TRANSPOSITION LEFT ARM;  Surgeon: Conrad [place], MD;  Location: [place];  Service: Vascular;  Laterality: Left;   BREAST BIOPSY Left 08/23/07   benign fibrocystic with duct ectasia   CARDIAC CATHETERIZATION  07-11-2012  dr Irish Lack   Abnormal stress test/   normal coronary arteries/  LVEDP  53mmHg   CARDIOVASCULAR STRESS TEST  06-26-2012  dr Irish Lack   marked ischemia in the basal anterior, mid anterior, apical inferior regions/  normal LVF, ef 63%   CATARACT EXTRACTION W/ INTRAOCULAR LENS  IMPLANT, BILATERAL     COLONOSCOPY WITH PROPOFOL N/A 10/17/2016   Procedure: COLONOSCOPY WITH PROPOFOL;  Surgeon: Garlan Fair, MD;  Location: [facility];  Service: Endoscopy;  Laterality: N/A;   CYSTOSCOPY W/ URETERAL STENT PLACEMENT  04/04/2012   Procedure: CYSTOSCOPY WITH RETROGRADE PYELOGRAM/URETERAL STENT PLACEMENT;  Surgeon: Ailene Rud, MD;  Location: [place];  Service: Urology;  Laterality: Left;   CYSTOSCOPY W/ URETERAL STENT PLACEMENT Bilateral 05/04/2015    Procedure: CYSTOSCOPY WITH BILATERAL RETROGRADE PYELOGRAM/ WITH INTERPRETATION, EXCHANGE OF RIGHT URETERAL STENT REPLACEMENT AND PLACEMENT LEFT URETERAL STENT PLACEMENT EXAMINATION OF VAGINA;  Surgeon: Carolan Clines, MD;  Location: [facility];  Service: Urology;  Laterality: Bilateral;   CYSTOSCOPY WITH STENT PLACEMENT Right 10/28/2014   Procedure: RIGHT URETERAL STENT PLACEMENT;  Surgeon: Irine Seal, MD;  [facility];  Service: Urology;  Laterality: Right;   CYSTOSCOPY WITH STENT PLACEMENT Right 02/26/2015   Procedure: CYSTOSCOPY RETROGRADE PYELOGRAM WITH STENT PLACEMENT;  Surgeon: Cleon Gustin, MD;  Location: [facility];  Service: Urology;  Laterality: Right;   CYSTOSCOPY/RETROGRADE/URETEROSCOPY/STONE EXTRACTION WITH BASKET Right 11/21/2014   Procedure: CYSTOSCOPY/RIGHT RETROGRADE PYELOGRAM/RIGHT URETEROSCOPY/BASKET EXTRACTION/RIGHT PYELOSCOPY/LASER OF STONE/RIGHT DOUBLE J STENT;  Surgeon: Carolan Clines, MD;  Location: [place];  Service: Urology;  Laterality: Right;   ESOPHAGOGASTRODUODENOSCOPY (EGD) WITH PROPOFOL Left 08/25/2018   Procedure: ESOPHAGOGASTRODUODENOSCOPY (EGD) WITH PROPOFOL;  Surgeon: Ronald Lobo, MD;  Location: [place];  Service: Endoscopy;  Laterality: Left;   EXTRACORPOREAL SHOCK WAVE LITHOTRIPSY  05-28-2012  &  10-08-2012   HOLMIUM  LASER APPLICATION Right 02/23/1883   Procedure: HOLMIUM LASER APPLICATION;  Surgeon: Carolan Clines, MD;  Location: [facility];  Service: Urology;  Laterality: Right;   IR GENERIC HISTORICAL  02/01/2016   IR NEPHROSTOMY EXCHANGE RIGHT 02/01/2016 Greggory Keen, MD [facility]   IR GENERIC HISTORICAL  02/24/2016   IR PATIENT EVAL TECH 0-60 MINS 02/24/2016 Aletta Edouard, MD [facility]   KNEE ARTHROSCOPY Left 02-14-2003   LAPAROSCOPIC CHOLECYSTECTOMY  03-23-2005   TOTAL ABDOMINAL HYSTERECTOMY W/ BILATERAL SALPINGOOPHORECTOMY  1993   secondary to fibroids   TRANSTHORACIC  ECHOCARDIOGRAM  04-09-2012   normal LVF,  ef 60-65%,  mild LAE,  mild TR, trivial MR and PR     Current Outpatient Medications  Medication Sig Dispense Refill   acetaminophen (TYLENOL) 500 MG tablet Take 1,000 mg by mouth every 6 (six) hours as needed for moderate pain or headache.      amiodarone (PACERONE) 200 MG tablet TAKE ONE TABLET BY MOUTH DAILY *PLEASE KEEP UPCOMING APPOINTMENT IN NOVEMBER FOR FUTURE REFILLS* 90 tablet 3   bimatoprost (LUMIGAN) 0.01 % SOLN Place 1 drop into both eyes at bedtime.     calcium acetate (PHOSLO) 667 MG capsule Take 1,334 mg by mouth 3 (three) times daily with meals.      cephALEXin (KEFLEX) 250 MG capsule Take 250 mg by mouth daily.     loperamide (IMODIUM A-D) 2 MG tablet Take 4-6 mg by mouth 4 (four) times daily as needed for diarrhea or loose stools.     Multiple Vitamins-Minerals (MULTIVITAMIN WITH MINERALS) tablet Take 1 tablet by mouth daily.     ondansetron (ZOFRAN) 4 MG tablet Take 4 mg by mouth every 8 (eight) hours as needed for nausea or vomiting.     saccharomyces boulardii (FLORASTOR) 250 MG capsule Take 1 capsule (250 mg total) by mouth 2 (two) times daily. 60 capsule 0   sodium chloride (OCEAN) 0.65 % SOLN nasal spray Place 1 spray into both nostrils 3 (three) times daily as needed for congestion.     timolol (BETIMOL) 0.5 % ophthalmic solution Place 1 drop into both eyes every morning.      warfarin (COUMADIN) 5 MG tablet Take 1/2 to 1 tablet daily as directed by Coumadin clinic 90 tablet 0   No current facility-administered medications for this visit.     Allergies:   Astemizole, Fluorouracil, Vicodin [hydrocodone-acetaminophen], Chlorhexidine, and Percocet [oxycodone-acetaminophen]    Social History:  The patient  reports that she has never smoked. She has never used smokeless tobacco. She reports that she does not drink alcohol or use drugs.   Family History:  The patient's family history includes Bladder Cancer in her  maternal grandfather; Cancer (age of onset: 69) in her father; Cancer (age of onset: 63) in her mother; Dementia in her mother; Diabetes in her brother; Heart disease in her brother; Heart failure in her paternal grandmother; Hypertension in her brother and mother.    ROS:  Please see the history of present illness.   Otherwise, review of systems are positive for knee pain, cramps.   All other systems are reviewed and negative.    PHYSICAL EXAM: VS:  BP 140/60    Pulse 83    Ht 5\' 3"  (1.6 m)    Wt 184 lb 12.8 oz (83.8 kg)    LMP 01/19/1992 (Approximate)    SpO2 90%    BMI 32.74 kg/m  , BMI Body mass index is 32.74 kg/m. GEN: Well nourished, well developed,  in no acute distress  HEENT: normal  Neck: no JVD, carotid bruits, or masses Cardiac: RRR; 3/6 systolic murmur, rubs, or gallops,; 1+ Left leg edema , tr right leg edema- left usually bigger than the right. Respiratory:  clear to auscultation bilaterally, normal work of breathing GI: soft, nontender, nondistended, + BS MS: no deformity or atrophy  Skin: warm and dry, no rash Neuro:  Strength and sensation are intact Psych: euthymic mood, full affect   EKG:   The ekg ordered today demonstrates NSR, no ST changes in the past   Recent Labs: 06/01/2018: TSH 2.760 08/25/2018: ALT 27 08/26/2018: Magnesium 2.1 08/27/2018: BUN 45; Creatinine, Ser 7.81; Hemoglobin 9.2; Platelets 203; Potassium 3.8; Sodium 136   Lipid Panel    Component Value Date/Time   CHOL 146 11/20/2015 1302   TRIG 84 07/04/2015 1014   HDL 43 07/04/2015 1014   CHOLHDL 2.9 07/04/2015 1014   VLDL 17 07/04/2015 1014   LDLCALC 63 07/04/2015 1014     Other studies Reviewed: Additional studies/ records that were reviewed today with results demonstrating: 2018 echo: - Left ventricle: The cavity size was normal. Wall thickness was   normal. Systolic function was normal. The estimated ejection   fraction was in the range of 55% to 60%. Wall motion was normal;   there were  no regional wall motion abnormalities. Features are   consistent with a pseudonormal left ventricular filling pattern,   with concomitant abnormal relaxation and increased filling   pressure (grade 2 diastolic dysfunction). - Aortic valve: There was mild stenosis. Valve area (VTI): 2.17   cm^2. Valve area (Vmax): 1.96 cm^2. Valve area (Vmean): 1.9 cm^2. - Mitral valve: Calcified annulus. - Pulmonary arteries: Systolic pressure was mildly increased.  Impressions:  - Normal LV systolic function; moderate diastolic dysfunction;   calcified aortic valve (not well visualized); mild AS (mean   gradient 18 mmHg); mild TR with mildly elevated pulmonary   pressure..   ASSESSMENT AND PLAN:  1. PAF: In NSR.  Tolerating Amio. 2. HTN: The current medical regimen is effective;  continue present plan and medications. 3. Mild AS: Echo from 2018, no sx of severe AS. 4. Prior cardiomyopathy: Resolved nonischemic cardiomyopathy in the past.  5. Anticoagulated: Warfarin for stroke prevention.  If you need an injection, ok to come off Coumadin if needed. INR 5.4 today.  Will be of Coumadin for a few days.   6. ESRD: Dialysis 3x/week.   Current medicines are reviewed at length with the patient today.  The patient concerns regarding her medicines were addressed.  The following changes have been made:  No change  Labs/ tests ordered today include: TSH and LFTs to be drawn at dialysis No orders of the defined types were placed in this encounter.   Recommend 150 minutes/week of aerobic exercise Low fat, low carb, high fiber diet recommended  Disposition:   FU in 1 year   Signed, Larae Grooms, MD  05/27/2019 10:36 AM    [facility] [address], [place], [place]  [address] Phone: [phone]; Fax: [phone]

## 2019-05-27 ENCOUNTER — Other Ambulatory Visit: Payer: Self-pay

## 2019-05-27 ENCOUNTER — Ambulatory Visit (INDEPENDENT_AMBULATORY_CARE_PROVIDER_SITE_OTHER): Payer: Medicare Other | Admitting: Interventional Cardiology

## 2019-05-27 ENCOUNTER — Encounter: Payer: Self-pay | Admitting: Interventional Cardiology

## 2019-05-27 ENCOUNTER — Ambulatory Visit (INDEPENDENT_AMBULATORY_CARE_PROVIDER_SITE_OTHER): Payer: Medicare Other | Admitting: Pharmacist

## 2019-05-27 VITALS — BP 140/60 | HR 83 | Ht 63.0 in | Wt 184.8 lb

## 2019-05-27 DIAGNOSIS — Z5181 Encounter for therapeutic drug level monitoring: Secondary | ICD-10-CM | POA: Diagnosis not present

## 2019-05-27 DIAGNOSIS — Z7901 Long term (current) use of anticoagulants: Secondary | ICD-10-CM | POA: Diagnosis not present

## 2019-05-27 DIAGNOSIS — I10 Essential (primary) hypertension: Secondary | ICD-10-CM | POA: Diagnosis not present

## 2019-05-27 DIAGNOSIS — I350 Nonrheumatic aortic (valve) stenosis: Secondary | ICD-10-CM | POA: Diagnosis not present

## 2019-05-27 DIAGNOSIS — I480 Paroxysmal atrial fibrillation: Secondary | ICD-10-CM

## 2019-05-27 DIAGNOSIS — I4891 Unspecified atrial fibrillation: Secondary | ICD-10-CM

## 2019-05-27 DIAGNOSIS — I428 Other cardiomyopathies: Secondary | ICD-10-CM

## 2019-05-27 DIAGNOSIS — M1712 Unilateral primary osteoarthritis, left knee: Secondary | ICD-10-CM | POA: Diagnosis not present

## 2019-05-27 LAB — POCT INR: INR: 5.4 — AB (ref 2.0–3.0)

## 2019-05-27 MED ORDER — AMIODARONE HCL 200 MG PO TABS: ORAL_TABLET | ORAL | 3 refills | Status: DC

## 2019-05-27 NOTE — Patient Instructions (Signed)
Description   Skip your Coumadin today and tomorrow, take 1/2 tablet on Wednesday, then resume normal dose 1/2 tablet daily except 1 tablet on Mondays, Wednesdays, and Fridays. Recheck INR 2 weeks. Call with any new medications or if scheduled for any procedures or with any questions [phone]

## 2019-05-27 NOTE — Patient Instructions (Signed)
Medication Instructions:  Your physician recommends that you continue on your current medications as directed. Please refer to the Current Medication list given to you today.  *If you need a refill on your cardiac medications before your next appointment, please call your pharmacy*  Lab Work: Please have TSH and LFTs checked with your Primary Care Doctor  If you have labs (blood work) drawn today and your tests are completely normal, you will receive your results only by: Marland Kitchen MyChart Message (if you have MyChart) OR . A paper copy in the mail If you have any lab test that is abnormal or we need to change your treatment, we will call you to review the results.  Testing/Procedures: None ordered  Follow-Up: At [facility], you and your health needs are our priority.  As part of our continuing mission to provide you with exceptional heart care, we have created designated Provider Care Teams.  These Care Teams include your primary Cardiologist (physician) and Advanced Practice Providers (APPs -  Physician Assistants and Nurse Practitioners) who all work together to provide you with the care you need, when you need it.  Your next appointment:   12 month(s)  The format for your next appointment:   In Person  Provider:   You may see Larae Grooms, MD or one of the following Advanced Practice Providers on your designated Care Team:    Melina Copa, PA-C  Ermalinda Barrios, PA-C   Other Instructions

## 2019-05-28 DIAGNOSIS — Z79899 Other long term (current) drug therapy: Secondary | ICD-10-CM | POA: Diagnosis not present

## 2019-05-28 DIAGNOSIS — Z992 Dependence on renal dialysis: Secondary | ICD-10-CM | POA: Diagnosis not present

## 2019-05-28 DIAGNOSIS — D688 Other specified coagulation defects: Secondary | ICD-10-CM | POA: Diagnosis not present

## 2019-05-28 DIAGNOSIS — R519 Headache, unspecified: Secondary | ICD-10-CM | POA: Diagnosis not present

## 2019-05-28 DIAGNOSIS — R197 Diarrhea, unspecified: Secondary | ICD-10-CM | POA: Diagnosis not present

## 2019-05-28 DIAGNOSIS — I482 Chronic atrial fibrillation, unspecified: Secondary | ICD-10-CM | POA: Diagnosis not present

## 2019-05-28 DIAGNOSIS — D509 Iron deficiency anemia, unspecified: Secondary | ICD-10-CM | POA: Diagnosis not present

## 2019-05-28 DIAGNOSIS — N186 End stage renal disease: Secondary | ICD-10-CM | POA: Diagnosis not present

## 2019-05-28 DIAGNOSIS — D631 Anemia in chronic kidney disease: Secondary | ICD-10-CM | POA: Diagnosis not present

## 2019-05-28 DIAGNOSIS — N2581 Secondary hyperparathyroidism of renal origin: Secondary | ICD-10-CM | POA: Diagnosis not present

## 2019-05-30 DIAGNOSIS — D631 Anemia in chronic kidney disease: Secondary | ICD-10-CM | POA: Diagnosis not present

## 2019-05-30 DIAGNOSIS — N186 End stage renal disease: Secondary | ICD-10-CM | POA: Diagnosis not present

## 2019-05-30 DIAGNOSIS — N2581 Secondary hyperparathyroidism of renal origin: Secondary | ICD-10-CM | POA: Diagnosis not present

## 2019-05-30 DIAGNOSIS — D509 Iron deficiency anemia, unspecified: Secondary | ICD-10-CM | POA: Diagnosis not present

## 2019-05-30 DIAGNOSIS — R519 Headache, unspecified: Secondary | ICD-10-CM | POA: Diagnosis not present

## 2019-05-30 DIAGNOSIS — R197 Diarrhea, unspecified: Secondary | ICD-10-CM | POA: Diagnosis not present

## 2019-05-30 DIAGNOSIS — Z79899 Other long term (current) drug therapy: Secondary | ICD-10-CM | POA: Diagnosis not present

## 2019-05-30 DIAGNOSIS — I482 Chronic atrial fibrillation, unspecified: Secondary | ICD-10-CM | POA: Diagnosis not present

## 2019-05-30 DIAGNOSIS — Z992 Dependence on renal dialysis: Secondary | ICD-10-CM | POA: Diagnosis not present

## 2019-05-30 DIAGNOSIS — D688 Other specified coagulation defects: Secondary | ICD-10-CM | POA: Diagnosis not present

## 2019-06-01 DIAGNOSIS — D509 Iron deficiency anemia, unspecified: Secondary | ICD-10-CM | POA: Diagnosis not present

## 2019-06-01 DIAGNOSIS — N186 End stage renal disease: Secondary | ICD-10-CM | POA: Diagnosis not present

## 2019-06-01 DIAGNOSIS — Z79899 Other long term (current) drug therapy: Secondary | ICD-10-CM | POA: Diagnosis not present

## 2019-06-01 DIAGNOSIS — I482 Chronic atrial fibrillation, unspecified: Secondary | ICD-10-CM | POA: Diagnosis not present

## 2019-06-01 DIAGNOSIS — Z992 Dependence on renal dialysis: Secondary | ICD-10-CM | POA: Diagnosis not present

## 2019-06-01 DIAGNOSIS — N2581 Secondary hyperparathyroidism of renal origin: Secondary | ICD-10-CM | POA: Diagnosis not present

## 2019-06-01 DIAGNOSIS — D631 Anemia in chronic kidney disease: Secondary | ICD-10-CM | POA: Diagnosis not present

## 2019-06-01 DIAGNOSIS — R519 Headache, unspecified: Secondary | ICD-10-CM | POA: Diagnosis not present

## 2019-06-01 DIAGNOSIS — R197 Diarrhea, unspecified: Secondary | ICD-10-CM | POA: Diagnosis not present

## 2019-06-01 DIAGNOSIS — D688 Other specified coagulation defects: Secondary | ICD-10-CM | POA: Diagnosis not present

## 2019-06-04 DIAGNOSIS — I482 Chronic atrial fibrillation, unspecified: Secondary | ICD-10-CM | POA: Diagnosis not present

## 2019-06-04 DIAGNOSIS — R519 Headache, unspecified: Secondary | ICD-10-CM | POA: Diagnosis not present

## 2019-06-04 DIAGNOSIS — Z79899 Other long term (current) drug therapy: Secondary | ICD-10-CM | POA: Diagnosis not present

## 2019-06-04 DIAGNOSIS — Z992 Dependence on renal dialysis: Secondary | ICD-10-CM | POA: Diagnosis not present

## 2019-06-04 DIAGNOSIS — D688 Other specified coagulation defects: Secondary | ICD-10-CM | POA: Diagnosis not present

## 2019-06-04 DIAGNOSIS — D509 Iron deficiency anemia, unspecified: Secondary | ICD-10-CM | POA: Diagnosis not present

## 2019-06-04 DIAGNOSIS — N186 End stage renal disease: Secondary | ICD-10-CM | POA: Diagnosis not present

## 2019-06-04 DIAGNOSIS — R197 Diarrhea, unspecified: Secondary | ICD-10-CM | POA: Diagnosis not present

## 2019-06-04 DIAGNOSIS — D631 Anemia in chronic kidney disease: Secondary | ICD-10-CM | POA: Diagnosis not present

## 2019-06-04 DIAGNOSIS — N2581 Secondary hyperparathyroidism of renal origin: Secondary | ICD-10-CM | POA: Diagnosis not present

## 2019-06-06 DIAGNOSIS — Z79899 Other long term (current) drug therapy: Secondary | ICD-10-CM | POA: Diagnosis not present

## 2019-06-06 DIAGNOSIS — N2581 Secondary hyperparathyroidism of renal origin: Secondary | ICD-10-CM | POA: Diagnosis not present

## 2019-06-06 DIAGNOSIS — R519 Headache, unspecified: Secondary | ICD-10-CM | POA: Diagnosis not present

## 2019-06-06 DIAGNOSIS — R197 Diarrhea, unspecified: Secondary | ICD-10-CM | POA: Diagnosis not present

## 2019-06-06 DIAGNOSIS — D509 Iron deficiency anemia, unspecified: Secondary | ICD-10-CM | POA: Diagnosis not present

## 2019-06-06 DIAGNOSIS — I482 Chronic atrial fibrillation, unspecified: Secondary | ICD-10-CM | POA: Diagnosis not present

## 2019-06-06 DIAGNOSIS — D631 Anemia in chronic kidney disease: Secondary | ICD-10-CM | POA: Diagnosis not present

## 2019-06-06 DIAGNOSIS — N186 End stage renal disease: Secondary | ICD-10-CM | POA: Diagnosis not present

## 2019-06-06 DIAGNOSIS — D688 Other specified coagulation defects: Secondary | ICD-10-CM | POA: Diagnosis not present

## 2019-06-06 DIAGNOSIS — Z992 Dependence on renal dialysis: Secondary | ICD-10-CM | POA: Diagnosis not present

## 2019-06-08 DIAGNOSIS — D509 Iron deficiency anemia, unspecified: Secondary | ICD-10-CM | POA: Diagnosis not present

## 2019-06-08 DIAGNOSIS — D631 Anemia in chronic kidney disease: Secondary | ICD-10-CM | POA: Diagnosis not present

## 2019-06-08 DIAGNOSIS — R197 Diarrhea, unspecified: Secondary | ICD-10-CM | POA: Diagnosis not present

## 2019-06-08 DIAGNOSIS — D688 Other specified coagulation defects: Secondary | ICD-10-CM | POA: Diagnosis not present

## 2019-06-08 DIAGNOSIS — N2581 Secondary hyperparathyroidism of renal origin: Secondary | ICD-10-CM | POA: Diagnosis not present

## 2019-06-08 DIAGNOSIS — R519 Headache, unspecified: Secondary | ICD-10-CM | POA: Diagnosis not present

## 2019-06-08 DIAGNOSIS — I482 Chronic atrial fibrillation, unspecified: Secondary | ICD-10-CM | POA: Diagnosis not present

## 2019-06-08 DIAGNOSIS — Z992 Dependence on renal dialysis: Secondary | ICD-10-CM | POA: Diagnosis not present

## 2019-06-08 DIAGNOSIS — Z79899 Other long term (current) drug therapy: Secondary | ICD-10-CM | POA: Diagnosis not present

## 2019-06-08 DIAGNOSIS — N186 End stage renal disease: Secondary | ICD-10-CM | POA: Diagnosis not present

## 2019-06-10 ENCOUNTER — Other Ambulatory Visit: Payer: Self-pay

## 2019-06-10 ENCOUNTER — Ambulatory Visit (INDEPENDENT_AMBULATORY_CARE_PROVIDER_SITE_OTHER): Payer: Medicare Other | Admitting: Pharmacist

## 2019-06-10 DIAGNOSIS — I4891 Unspecified atrial fibrillation: Secondary | ICD-10-CM

## 2019-06-10 DIAGNOSIS — I480 Paroxysmal atrial fibrillation: Secondary | ICD-10-CM | POA: Diagnosis not present

## 2019-06-10 DIAGNOSIS — Z5181 Encounter for therapeutic drug level monitoring: Secondary | ICD-10-CM

## 2019-06-10 LAB — POCT INR: INR: 5.1 — AB (ref 2.0–3.0)

## 2019-06-10 NOTE — Patient Instructions (Signed)
Description   Skip your Coumadin today, tomorrow, and Wednesday, then take 1 tablet on Tuesday and Thursdays and 1/2 tablet on all other days. Recheck INR 2 weeks. Call with any new medications or if scheduled for any procedures or with any questions [phone]

## 2019-06-11 DIAGNOSIS — Z79899 Other long term (current) drug therapy: Secondary | ICD-10-CM | POA: Diagnosis not present

## 2019-06-11 DIAGNOSIS — I482 Chronic atrial fibrillation, unspecified: Secondary | ICD-10-CM | POA: Diagnosis not present

## 2019-06-11 DIAGNOSIS — D509 Iron deficiency anemia, unspecified: Secondary | ICD-10-CM | POA: Diagnosis not present

## 2019-06-11 DIAGNOSIS — R519 Headache, unspecified: Secondary | ICD-10-CM | POA: Diagnosis not present

## 2019-06-11 DIAGNOSIS — D688 Other specified coagulation defects: Secondary | ICD-10-CM | POA: Diagnosis not present

## 2019-06-11 DIAGNOSIS — R197 Diarrhea, unspecified: Secondary | ICD-10-CM | POA: Diagnosis not present

## 2019-06-11 DIAGNOSIS — D631 Anemia in chronic kidney disease: Secondary | ICD-10-CM | POA: Diagnosis not present

## 2019-06-11 DIAGNOSIS — Z992 Dependence on renal dialysis: Secondary | ICD-10-CM | POA: Diagnosis not present

## 2019-06-11 DIAGNOSIS — N186 End stage renal disease: Secondary | ICD-10-CM | POA: Diagnosis not present

## 2019-06-11 DIAGNOSIS — N2581 Secondary hyperparathyroidism of renal origin: Secondary | ICD-10-CM | POA: Diagnosis not present

## 2019-06-13 DIAGNOSIS — I482 Chronic atrial fibrillation, unspecified: Secondary | ICD-10-CM | POA: Diagnosis not present

## 2019-06-13 DIAGNOSIS — N2581 Secondary hyperparathyroidism of renal origin: Secondary | ICD-10-CM | POA: Diagnosis not present

## 2019-06-13 DIAGNOSIS — Z992 Dependence on renal dialysis: Secondary | ICD-10-CM | POA: Diagnosis not present

## 2019-06-13 DIAGNOSIS — D688 Other specified coagulation defects: Secondary | ICD-10-CM | POA: Diagnosis not present

## 2019-06-13 DIAGNOSIS — R197 Diarrhea, unspecified: Secondary | ICD-10-CM | POA: Diagnosis not present

## 2019-06-13 DIAGNOSIS — R519 Headache, unspecified: Secondary | ICD-10-CM | POA: Diagnosis not present

## 2019-06-13 DIAGNOSIS — N186 End stage renal disease: Secondary | ICD-10-CM | POA: Diagnosis not present

## 2019-06-13 DIAGNOSIS — Z79899 Other long term (current) drug therapy: Secondary | ICD-10-CM | POA: Diagnosis not present

## 2019-06-13 DIAGNOSIS — D631 Anemia in chronic kidney disease: Secondary | ICD-10-CM | POA: Diagnosis not present

## 2019-06-13 DIAGNOSIS — D509 Iron deficiency anemia, unspecified: Secondary | ICD-10-CM | POA: Diagnosis not present

## 2019-06-16 DIAGNOSIS — Z79899 Other long term (current) drug therapy: Secondary | ICD-10-CM | POA: Diagnosis not present

## 2019-06-16 DIAGNOSIS — D688 Other specified coagulation defects: Secondary | ICD-10-CM | POA: Diagnosis not present

## 2019-06-16 DIAGNOSIS — I482 Chronic atrial fibrillation, unspecified: Secondary | ICD-10-CM | POA: Diagnosis not present

## 2019-06-16 DIAGNOSIS — R197 Diarrhea, unspecified: Secondary | ICD-10-CM | POA: Diagnosis not present

## 2019-06-16 DIAGNOSIS — R519 Headache, unspecified: Secondary | ICD-10-CM | POA: Diagnosis not present

## 2019-06-16 DIAGNOSIS — D631 Anemia in chronic kidney disease: Secondary | ICD-10-CM | POA: Diagnosis not present

## 2019-06-16 DIAGNOSIS — N2581 Secondary hyperparathyroidism of renal origin: Secondary | ICD-10-CM | POA: Diagnosis not present

## 2019-06-16 DIAGNOSIS — N186 End stage renal disease: Secondary | ICD-10-CM | POA: Diagnosis not present

## 2019-06-16 DIAGNOSIS — D509 Iron deficiency anemia, unspecified: Secondary | ICD-10-CM | POA: Diagnosis not present

## 2019-06-16 DIAGNOSIS — Z992 Dependence on renal dialysis: Secondary | ICD-10-CM | POA: Diagnosis not present

## 2019-06-18 DIAGNOSIS — R519 Headache, unspecified: Secondary | ICD-10-CM | POA: Diagnosis not present

## 2019-06-18 DIAGNOSIS — N186 End stage renal disease: Secondary | ICD-10-CM | POA: Diagnosis not present

## 2019-06-18 DIAGNOSIS — Z992 Dependence on renal dialysis: Secondary | ICD-10-CM | POA: Diagnosis not present

## 2019-06-18 DIAGNOSIS — N2581 Secondary hyperparathyroidism of renal origin: Secondary | ICD-10-CM | POA: Diagnosis not present

## 2019-06-18 DIAGNOSIS — D631 Anemia in chronic kidney disease: Secondary | ICD-10-CM | POA: Diagnosis not present

## 2019-06-18 DIAGNOSIS — D509 Iron deficiency anemia, unspecified: Secondary | ICD-10-CM | POA: Diagnosis not present

## 2019-06-18 DIAGNOSIS — I482 Chronic atrial fibrillation, unspecified: Secondary | ICD-10-CM | POA: Diagnosis not present

## 2019-06-18 DIAGNOSIS — Z79899 Other long term (current) drug therapy: Secondary | ICD-10-CM | POA: Diagnosis not present

## 2019-06-18 DIAGNOSIS — R197 Diarrhea, unspecified: Secondary | ICD-10-CM | POA: Diagnosis not present

## 2019-06-18 DIAGNOSIS — D688 Other specified coagulation defects: Secondary | ICD-10-CM | POA: Diagnosis not present

## 2019-06-20 DIAGNOSIS — D688 Other specified coagulation defects: Secondary | ICD-10-CM | POA: Diagnosis not present

## 2019-06-20 DIAGNOSIS — R197 Diarrhea, unspecified: Secondary | ICD-10-CM | POA: Diagnosis not present

## 2019-06-20 DIAGNOSIS — I129 Hypertensive chronic kidney disease with stage 1 through stage 4 chronic kidney disease, or unspecified chronic kidney disease: Secondary | ICD-10-CM | POA: Diagnosis not present

## 2019-06-20 DIAGNOSIS — N186 End stage renal disease: Secondary | ICD-10-CM | POA: Diagnosis not present

## 2019-06-20 DIAGNOSIS — D509 Iron deficiency anemia, unspecified: Secondary | ICD-10-CM | POA: Diagnosis not present

## 2019-06-20 DIAGNOSIS — N2581 Secondary hyperparathyroidism of renal origin: Secondary | ICD-10-CM | POA: Diagnosis not present

## 2019-06-20 DIAGNOSIS — I482 Chronic atrial fibrillation, unspecified: Secondary | ICD-10-CM | POA: Diagnosis not present

## 2019-06-20 DIAGNOSIS — D631 Anemia in chronic kidney disease: Secondary | ICD-10-CM | POA: Diagnosis not present

## 2019-06-20 DIAGNOSIS — R519 Headache, unspecified: Secondary | ICD-10-CM | POA: Diagnosis not present

## 2019-06-20 DIAGNOSIS — Z992 Dependence on renal dialysis: Secondary | ICD-10-CM | POA: Diagnosis not present

## 2019-06-20 DIAGNOSIS — Z79899 Other long term (current) drug therapy: Secondary | ICD-10-CM | POA: Diagnosis not present

## 2019-06-23 DIAGNOSIS — N186 End stage renal disease: Secondary | ICD-10-CM | POA: Diagnosis not present

## 2019-06-23 DIAGNOSIS — R519 Headache, unspecified: Secondary | ICD-10-CM | POA: Diagnosis not present

## 2019-06-23 DIAGNOSIS — D631 Anemia in chronic kidney disease: Secondary | ICD-10-CM | POA: Diagnosis not present

## 2019-06-23 DIAGNOSIS — N2581 Secondary hyperparathyroidism of renal origin: Secondary | ICD-10-CM | POA: Diagnosis not present

## 2019-06-23 DIAGNOSIS — I482 Chronic atrial fibrillation, unspecified: Secondary | ICD-10-CM | POA: Diagnosis not present

## 2019-06-23 DIAGNOSIS — D688 Other specified coagulation defects: Secondary | ICD-10-CM | POA: Diagnosis not present

## 2019-06-23 DIAGNOSIS — Z992 Dependence on renal dialysis: Secondary | ICD-10-CM | POA: Diagnosis not present

## 2019-06-23 DIAGNOSIS — D509 Iron deficiency anemia, unspecified: Secondary | ICD-10-CM | POA: Diagnosis not present

## 2019-06-23 DIAGNOSIS — Z5181 Encounter for therapeutic drug level monitoring: Secondary | ICD-10-CM | POA: Diagnosis not present

## 2019-06-23 DIAGNOSIS — R197 Diarrhea, unspecified: Secondary | ICD-10-CM | POA: Diagnosis not present

## 2019-06-23 DIAGNOSIS — E039 Hypothyroidism, unspecified: Secondary | ICD-10-CM | POA: Diagnosis not present

## 2019-06-24 ENCOUNTER — Other Ambulatory Visit: Payer: Self-pay

## 2019-06-24 ENCOUNTER — Ambulatory Visit: Payer: Medicare Other | Admitting: Pharmacist

## 2019-06-24 DIAGNOSIS — I4891 Unspecified atrial fibrillation: Secondary | ICD-10-CM

## 2019-06-24 DIAGNOSIS — I480 Paroxysmal atrial fibrillation: Secondary | ICD-10-CM

## 2019-06-24 DIAGNOSIS — Z5181 Encounter for therapeutic drug level monitoring: Secondary | ICD-10-CM

## 2019-06-24 LAB — POCT INR: INR: 2.8 (ref 2.0–3.0)

## 2019-06-24 NOTE — Patient Instructions (Signed)
Continue taking 1 tablet on Tuesday and Thursdays and 1/2 tablet on all other days. Recheck INR 3 weeks. Call with any new medications or if scheduled for any procedures or with any questions [phone]

## 2019-06-25 DIAGNOSIS — D631 Anemia in chronic kidney disease: Secondary | ICD-10-CM | POA: Diagnosis not present

## 2019-06-25 DIAGNOSIS — N186 End stage renal disease: Secondary | ICD-10-CM | POA: Diagnosis not present

## 2019-06-25 DIAGNOSIS — D688 Other specified coagulation defects: Secondary | ICD-10-CM | POA: Diagnosis not present

## 2019-06-25 DIAGNOSIS — Z992 Dependence on renal dialysis: Secondary | ICD-10-CM | POA: Diagnosis not present

## 2019-06-25 DIAGNOSIS — E039 Hypothyroidism, unspecified: Secondary | ICD-10-CM | POA: Diagnosis not present

## 2019-06-25 DIAGNOSIS — N2581 Secondary hyperparathyroidism of renal origin: Secondary | ICD-10-CM | POA: Diagnosis not present

## 2019-06-25 DIAGNOSIS — R519 Headache, unspecified: Secondary | ICD-10-CM | POA: Diagnosis not present

## 2019-06-25 DIAGNOSIS — Z5181 Encounter for therapeutic drug level monitoring: Secondary | ICD-10-CM | POA: Diagnosis not present

## 2019-06-25 DIAGNOSIS — I482 Chronic atrial fibrillation, unspecified: Secondary | ICD-10-CM | POA: Diagnosis not present

## 2019-06-25 DIAGNOSIS — R197 Diarrhea, unspecified: Secondary | ICD-10-CM | POA: Diagnosis not present

## 2019-06-25 DIAGNOSIS — D509 Iron deficiency anemia, unspecified: Secondary | ICD-10-CM | POA: Diagnosis not present

## 2019-06-26 DIAGNOSIS — M1712 Unilateral primary osteoarthritis, left knee: Secondary | ICD-10-CM | POA: Diagnosis not present

## 2019-06-27 DIAGNOSIS — I482 Chronic atrial fibrillation, unspecified: Secondary | ICD-10-CM | POA: Diagnosis not present

## 2019-06-27 DIAGNOSIS — Z5181 Encounter for therapeutic drug level monitoring: Secondary | ICD-10-CM | POA: Diagnosis not present

## 2019-06-27 DIAGNOSIS — N2581 Secondary hyperparathyroidism of renal origin: Secondary | ICD-10-CM | POA: Diagnosis not present

## 2019-06-27 DIAGNOSIS — D631 Anemia in chronic kidney disease: Secondary | ICD-10-CM | POA: Diagnosis not present

## 2019-06-27 DIAGNOSIS — R519 Headache, unspecified: Secondary | ICD-10-CM | POA: Diagnosis not present

## 2019-06-27 DIAGNOSIS — Z992 Dependence on renal dialysis: Secondary | ICD-10-CM | POA: Diagnosis not present

## 2019-06-27 DIAGNOSIS — R197 Diarrhea, unspecified: Secondary | ICD-10-CM | POA: Diagnosis not present

## 2019-06-27 DIAGNOSIS — N186 End stage renal disease: Secondary | ICD-10-CM | POA: Diagnosis not present

## 2019-06-27 DIAGNOSIS — E039 Hypothyroidism, unspecified: Secondary | ICD-10-CM | POA: Diagnosis not present

## 2019-06-27 DIAGNOSIS — D509 Iron deficiency anemia, unspecified: Secondary | ICD-10-CM | POA: Diagnosis not present

## 2019-06-27 DIAGNOSIS — D688 Other specified coagulation defects: Secondary | ICD-10-CM | POA: Diagnosis not present

## 2019-06-29 DIAGNOSIS — D688 Other specified coagulation defects: Secondary | ICD-10-CM | POA: Diagnosis not present

## 2019-06-29 DIAGNOSIS — I482 Chronic atrial fibrillation, unspecified: Secondary | ICD-10-CM | POA: Diagnosis not present

## 2019-06-29 DIAGNOSIS — N2581 Secondary hyperparathyroidism of renal origin: Secondary | ICD-10-CM | POA: Diagnosis not present

## 2019-06-29 DIAGNOSIS — Z992 Dependence on renal dialysis: Secondary | ICD-10-CM | POA: Diagnosis not present

## 2019-06-29 DIAGNOSIS — R197 Diarrhea, unspecified: Secondary | ICD-10-CM | POA: Diagnosis not present

## 2019-06-29 DIAGNOSIS — E039 Hypothyroidism, unspecified: Secondary | ICD-10-CM | POA: Diagnosis not present

## 2019-06-29 DIAGNOSIS — D509 Iron deficiency anemia, unspecified: Secondary | ICD-10-CM | POA: Diagnosis not present

## 2019-06-29 DIAGNOSIS — Z5181 Encounter for therapeutic drug level monitoring: Secondary | ICD-10-CM | POA: Diagnosis not present

## 2019-06-29 DIAGNOSIS — R519 Headache, unspecified: Secondary | ICD-10-CM | POA: Diagnosis not present

## 2019-06-29 DIAGNOSIS — D631 Anemia in chronic kidney disease: Secondary | ICD-10-CM | POA: Diagnosis not present

## 2019-06-29 DIAGNOSIS — N186 End stage renal disease: Secondary | ICD-10-CM | POA: Diagnosis not present

## 2019-07-02 DIAGNOSIS — D688 Other specified coagulation defects: Secondary | ICD-10-CM | POA: Diagnosis not present

## 2019-07-02 DIAGNOSIS — N2581 Secondary hyperparathyroidism of renal origin: Secondary | ICD-10-CM | POA: Diagnosis not present

## 2019-07-02 DIAGNOSIS — E039 Hypothyroidism, unspecified: Secondary | ICD-10-CM | POA: Diagnosis not present

## 2019-07-02 DIAGNOSIS — Z5181 Encounter for therapeutic drug level monitoring: Secondary | ICD-10-CM | POA: Diagnosis not present

## 2019-07-02 DIAGNOSIS — I482 Chronic atrial fibrillation, unspecified: Secondary | ICD-10-CM | POA: Diagnosis not present

## 2019-07-02 DIAGNOSIS — N186 End stage renal disease: Secondary | ICD-10-CM | POA: Diagnosis not present

## 2019-07-02 DIAGNOSIS — R197 Diarrhea, unspecified: Secondary | ICD-10-CM | POA: Diagnosis not present

## 2019-07-02 DIAGNOSIS — R519 Headache, unspecified: Secondary | ICD-10-CM | POA: Diagnosis not present

## 2019-07-02 DIAGNOSIS — D509 Iron deficiency anemia, unspecified: Secondary | ICD-10-CM | POA: Diagnosis not present

## 2019-07-02 DIAGNOSIS — Z992 Dependence on renal dialysis: Secondary | ICD-10-CM | POA: Diagnosis not present

## 2019-07-02 DIAGNOSIS — D631 Anemia in chronic kidney disease: Secondary | ICD-10-CM | POA: Diagnosis not present

## 2019-07-04 DIAGNOSIS — Z992 Dependence on renal dialysis: Secondary | ICD-10-CM | POA: Diagnosis not present

## 2019-07-04 DIAGNOSIS — N2581 Secondary hyperparathyroidism of renal origin: Secondary | ICD-10-CM | POA: Diagnosis not present

## 2019-07-04 DIAGNOSIS — I482 Chronic atrial fibrillation, unspecified: Secondary | ICD-10-CM | POA: Diagnosis not present

## 2019-07-04 DIAGNOSIS — N186 End stage renal disease: Secondary | ICD-10-CM | POA: Diagnosis not present

## 2019-07-04 DIAGNOSIS — R519 Headache, unspecified: Secondary | ICD-10-CM | POA: Diagnosis not present

## 2019-07-04 DIAGNOSIS — E039 Hypothyroidism, unspecified: Secondary | ICD-10-CM | POA: Diagnosis not present

## 2019-07-04 DIAGNOSIS — D509 Iron deficiency anemia, unspecified: Secondary | ICD-10-CM | POA: Diagnosis not present

## 2019-07-04 DIAGNOSIS — D688 Other specified coagulation defects: Secondary | ICD-10-CM | POA: Diagnosis not present

## 2019-07-04 DIAGNOSIS — Z5181 Encounter for therapeutic drug level monitoring: Secondary | ICD-10-CM | POA: Diagnosis not present

## 2019-07-04 DIAGNOSIS — R197 Diarrhea, unspecified: Secondary | ICD-10-CM | POA: Diagnosis not present

## 2019-07-04 DIAGNOSIS — D631 Anemia in chronic kidney disease: Secondary | ICD-10-CM | POA: Diagnosis not present

## 2019-07-06 DIAGNOSIS — Z5181 Encounter for therapeutic drug level monitoring: Secondary | ICD-10-CM | POA: Diagnosis not present

## 2019-07-06 DIAGNOSIS — R519 Headache, unspecified: Secondary | ICD-10-CM | POA: Diagnosis not present

## 2019-07-06 DIAGNOSIS — D509 Iron deficiency anemia, unspecified: Secondary | ICD-10-CM | POA: Diagnosis not present

## 2019-07-06 DIAGNOSIS — I482 Chronic atrial fibrillation, unspecified: Secondary | ICD-10-CM | POA: Diagnosis not present

## 2019-07-06 DIAGNOSIS — N186 End stage renal disease: Secondary | ICD-10-CM | POA: Diagnosis not present

## 2019-07-06 DIAGNOSIS — R197 Diarrhea, unspecified: Secondary | ICD-10-CM | POA: Diagnosis not present

## 2019-07-06 DIAGNOSIS — E039 Hypothyroidism, unspecified: Secondary | ICD-10-CM | POA: Diagnosis not present

## 2019-07-06 DIAGNOSIS — D631 Anemia in chronic kidney disease: Secondary | ICD-10-CM | POA: Diagnosis not present

## 2019-07-06 DIAGNOSIS — D688 Other specified coagulation defects: Secondary | ICD-10-CM | POA: Diagnosis not present

## 2019-07-06 DIAGNOSIS — Z992 Dependence on renal dialysis: Secondary | ICD-10-CM | POA: Diagnosis not present

## 2019-07-06 DIAGNOSIS — N2581 Secondary hyperparathyroidism of renal origin: Secondary | ICD-10-CM | POA: Diagnosis not present

## 2019-07-09 DIAGNOSIS — Z992 Dependence on renal dialysis: Secondary | ICD-10-CM | POA: Diagnosis not present

## 2019-07-09 DIAGNOSIS — R519 Headache, unspecified: Secondary | ICD-10-CM | POA: Diagnosis not present

## 2019-07-09 DIAGNOSIS — D509 Iron deficiency anemia, unspecified: Secondary | ICD-10-CM | POA: Diagnosis not present

## 2019-07-09 DIAGNOSIS — E039 Hypothyroidism, unspecified: Secondary | ICD-10-CM | POA: Diagnosis not present

## 2019-07-09 DIAGNOSIS — R197 Diarrhea, unspecified: Secondary | ICD-10-CM | POA: Diagnosis not present

## 2019-07-09 DIAGNOSIS — N2581 Secondary hyperparathyroidism of renal origin: Secondary | ICD-10-CM | POA: Diagnosis not present

## 2019-07-09 DIAGNOSIS — D688 Other specified coagulation defects: Secondary | ICD-10-CM | POA: Diagnosis not present

## 2019-07-09 DIAGNOSIS — D631 Anemia in chronic kidney disease: Secondary | ICD-10-CM | POA: Diagnosis not present

## 2019-07-09 DIAGNOSIS — Z5181 Encounter for therapeutic drug level monitoring: Secondary | ICD-10-CM | POA: Diagnosis not present

## 2019-07-09 DIAGNOSIS — I482 Chronic atrial fibrillation, unspecified: Secondary | ICD-10-CM | POA: Diagnosis not present

## 2019-07-09 DIAGNOSIS — N186 End stage renal disease: Secondary | ICD-10-CM | POA: Diagnosis not present

## 2019-07-10 DIAGNOSIS — M1712 Unilateral primary osteoarthritis, left knee: Secondary | ICD-10-CM | POA: Diagnosis not present

## 2019-07-11 DIAGNOSIS — D509 Iron deficiency anemia, unspecified: Secondary | ICD-10-CM | POA: Diagnosis not present

## 2019-07-11 DIAGNOSIS — D631 Anemia in chronic kidney disease: Secondary | ICD-10-CM | POA: Diagnosis not present

## 2019-07-11 DIAGNOSIS — E039 Hypothyroidism, unspecified: Secondary | ICD-10-CM | POA: Diagnosis not present

## 2019-07-11 DIAGNOSIS — R197 Diarrhea, unspecified: Secondary | ICD-10-CM | POA: Diagnosis not present

## 2019-07-11 DIAGNOSIS — Z5181 Encounter for therapeutic drug level monitoring: Secondary | ICD-10-CM | POA: Diagnosis not present

## 2019-07-11 DIAGNOSIS — N2581 Secondary hyperparathyroidism of renal origin: Secondary | ICD-10-CM | POA: Diagnosis not present

## 2019-07-11 DIAGNOSIS — I482 Chronic atrial fibrillation, unspecified: Secondary | ICD-10-CM | POA: Diagnosis not present

## 2019-07-11 DIAGNOSIS — R519 Headache, unspecified: Secondary | ICD-10-CM | POA: Diagnosis not present

## 2019-07-11 DIAGNOSIS — D688 Other specified coagulation defects: Secondary | ICD-10-CM | POA: Diagnosis not present

## 2019-07-11 DIAGNOSIS — N186 End stage renal disease: Secondary | ICD-10-CM | POA: Diagnosis not present

## 2019-07-11 DIAGNOSIS — Z992 Dependence on renal dialysis: Secondary | ICD-10-CM | POA: Diagnosis not present

## 2019-07-12 ENCOUNTER — Ambulatory Visit: Payer: Medicare Other | Attending: Internal Medicine

## 2019-07-12 DIAGNOSIS — Z23 Encounter for immunization: Secondary | ICD-10-CM | POA: Insufficient documentation

## 2019-07-12 NOTE — Progress Notes (Signed)
   Covid-19 Vaccination Clinic  Name:  [name]    MRN: [id number] DOB: March 22, 1946  07/12/2019  [name] was observed post Covid-19 immunization for 15 minutes without incidence. She was provided with Vaccine Information Sheet and instruction to access the V-Safe system.   [name] was instructed to call 911 with any severe reactions post vaccine: Marland Kitchen Difficulty breathing  . Swelling of your face and throat  . A fast heartbeat  . A bad rash all over your body  . Dizziness and weakness    Immunizations Administered    Name Date Dose VIS Date Route   Pfizer COVID-19 Vaccine 07/12/2019 10:35 AM 0.3 mL 05/31/2019 Intramuscular   Manufacturer: [place]   Lot: [id number]   [place]: [id number]

## 2019-07-13 DIAGNOSIS — N186 End stage renal disease: Secondary | ICD-10-CM | POA: Diagnosis not present

## 2019-07-13 DIAGNOSIS — Z992 Dependence on renal dialysis: Secondary | ICD-10-CM | POA: Diagnosis not present

## 2019-07-13 DIAGNOSIS — I482 Chronic atrial fibrillation, unspecified: Secondary | ICD-10-CM | POA: Diagnosis not present

## 2019-07-13 DIAGNOSIS — D509 Iron deficiency anemia, unspecified: Secondary | ICD-10-CM | POA: Diagnosis not present

## 2019-07-13 DIAGNOSIS — R519 Headache, unspecified: Secondary | ICD-10-CM | POA: Diagnosis not present

## 2019-07-13 DIAGNOSIS — R197 Diarrhea, unspecified: Secondary | ICD-10-CM | POA: Diagnosis not present

## 2019-07-13 DIAGNOSIS — E039 Hypothyroidism, unspecified: Secondary | ICD-10-CM | POA: Diagnosis not present

## 2019-07-13 DIAGNOSIS — D631 Anemia in chronic kidney disease: Secondary | ICD-10-CM | POA: Diagnosis not present

## 2019-07-13 DIAGNOSIS — D688 Other specified coagulation defects: Secondary | ICD-10-CM | POA: Diagnosis not present

## 2019-07-13 DIAGNOSIS — N2581 Secondary hyperparathyroidism of renal origin: Secondary | ICD-10-CM | POA: Diagnosis not present

## 2019-07-13 DIAGNOSIS — Z5181 Encounter for therapeutic drug level monitoring: Secondary | ICD-10-CM | POA: Diagnosis not present

## 2019-07-15 ENCOUNTER — Other Ambulatory Visit: Payer: Self-pay

## 2019-07-15 ENCOUNTER — Ambulatory Visit (INDEPENDENT_AMBULATORY_CARE_PROVIDER_SITE_OTHER): Payer: Medicare Other | Admitting: *Deleted

## 2019-07-15 DIAGNOSIS — I480 Paroxysmal atrial fibrillation: Secondary | ICD-10-CM

## 2019-07-15 DIAGNOSIS — I4891 Unspecified atrial fibrillation: Secondary | ICD-10-CM

## 2019-07-15 DIAGNOSIS — Z5181 Encounter for therapeutic drug level monitoring: Secondary | ICD-10-CM

## 2019-07-15 LAB — POCT INR: INR: 3.8 — AB (ref 2.0–3.0)

## 2019-07-15 NOTE — Patient Instructions (Signed)
Description   Do not take any Warfarin today then continue taking 1 tablet on Tuesday and Thursdays and 1/2 tablet on all other days. Recheck INR 3 weeks. Call with any new medications or if scheduled for any procedures or with any questions [phone]

## 2019-07-16 DIAGNOSIS — D509 Iron deficiency anemia, unspecified: Secondary | ICD-10-CM | POA: Diagnosis not present

## 2019-07-16 DIAGNOSIS — R519 Headache, unspecified: Secondary | ICD-10-CM | POA: Diagnosis not present

## 2019-07-16 DIAGNOSIS — D631 Anemia in chronic kidney disease: Secondary | ICD-10-CM | POA: Diagnosis not present

## 2019-07-16 DIAGNOSIS — N186 End stage renal disease: Secondary | ICD-10-CM | POA: Diagnosis not present

## 2019-07-16 DIAGNOSIS — R197 Diarrhea, unspecified: Secondary | ICD-10-CM | POA: Diagnosis not present

## 2019-07-16 DIAGNOSIS — N2581 Secondary hyperparathyroidism of renal origin: Secondary | ICD-10-CM | POA: Diagnosis not present

## 2019-07-16 DIAGNOSIS — E039 Hypothyroidism, unspecified: Secondary | ICD-10-CM | POA: Diagnosis not present

## 2019-07-16 DIAGNOSIS — Z992 Dependence on renal dialysis: Secondary | ICD-10-CM | POA: Diagnosis not present

## 2019-07-16 DIAGNOSIS — D688 Other specified coagulation defects: Secondary | ICD-10-CM | POA: Diagnosis not present

## 2019-07-16 DIAGNOSIS — I482 Chronic atrial fibrillation, unspecified: Secondary | ICD-10-CM | POA: Diagnosis not present

## 2019-07-16 DIAGNOSIS — Z5181 Encounter for therapeutic drug level monitoring: Secondary | ICD-10-CM | POA: Diagnosis not present

## 2019-07-17 DIAGNOSIS — M1712 Unilateral primary osteoarthritis, left knee: Secondary | ICD-10-CM | POA: Diagnosis not present

## 2019-07-18 DIAGNOSIS — R519 Headache, unspecified: Secondary | ICD-10-CM | POA: Diagnosis not present

## 2019-07-18 DIAGNOSIS — N186 End stage renal disease: Secondary | ICD-10-CM | POA: Diagnosis not present

## 2019-07-18 DIAGNOSIS — D631 Anemia in chronic kidney disease: Secondary | ICD-10-CM | POA: Diagnosis not present

## 2019-07-18 DIAGNOSIS — D509 Iron deficiency anemia, unspecified: Secondary | ICD-10-CM | POA: Diagnosis not present

## 2019-07-18 DIAGNOSIS — E039 Hypothyroidism, unspecified: Secondary | ICD-10-CM | POA: Diagnosis not present

## 2019-07-18 DIAGNOSIS — D688 Other specified coagulation defects: Secondary | ICD-10-CM | POA: Diagnosis not present

## 2019-07-18 DIAGNOSIS — I482 Chronic atrial fibrillation, unspecified: Secondary | ICD-10-CM | POA: Diagnosis not present

## 2019-07-18 DIAGNOSIS — R197 Diarrhea, unspecified: Secondary | ICD-10-CM | POA: Diagnosis not present

## 2019-07-18 DIAGNOSIS — Z992 Dependence on renal dialysis: Secondary | ICD-10-CM | POA: Diagnosis not present

## 2019-07-18 DIAGNOSIS — N2581 Secondary hyperparathyroidism of renal origin: Secondary | ICD-10-CM | POA: Diagnosis not present

## 2019-07-18 DIAGNOSIS — Z5181 Encounter for therapeutic drug level monitoring: Secondary | ICD-10-CM | POA: Diagnosis not present

## 2019-07-20 DIAGNOSIS — N186 End stage renal disease: Secondary | ICD-10-CM | POA: Diagnosis not present

## 2019-07-20 DIAGNOSIS — R519 Headache, unspecified: Secondary | ICD-10-CM | POA: Diagnosis not present

## 2019-07-20 DIAGNOSIS — D688 Other specified coagulation defects: Secondary | ICD-10-CM | POA: Diagnosis not present

## 2019-07-20 DIAGNOSIS — N2581 Secondary hyperparathyroidism of renal origin: Secondary | ICD-10-CM | POA: Diagnosis not present

## 2019-07-20 DIAGNOSIS — D509 Iron deficiency anemia, unspecified: Secondary | ICD-10-CM | POA: Diagnosis not present

## 2019-07-20 DIAGNOSIS — Z5181 Encounter for therapeutic drug level monitoring: Secondary | ICD-10-CM | POA: Diagnosis not present

## 2019-07-20 DIAGNOSIS — R197 Diarrhea, unspecified: Secondary | ICD-10-CM | POA: Diagnosis not present

## 2019-07-20 DIAGNOSIS — Z992 Dependence on renal dialysis: Secondary | ICD-10-CM | POA: Diagnosis not present

## 2019-07-20 DIAGNOSIS — D631 Anemia in chronic kidney disease: Secondary | ICD-10-CM | POA: Diagnosis not present

## 2019-07-20 DIAGNOSIS — E039 Hypothyroidism, unspecified: Secondary | ICD-10-CM | POA: Diagnosis not present

## 2019-07-20 DIAGNOSIS — I482 Chronic atrial fibrillation, unspecified: Secondary | ICD-10-CM | POA: Diagnosis not present

## 2019-07-21 DIAGNOSIS — I129 Hypertensive chronic kidney disease with stage 1 through stage 4 chronic kidney disease, or unspecified chronic kidney disease: Secondary | ICD-10-CM | POA: Diagnosis not present

## 2019-07-21 DIAGNOSIS — Z992 Dependence on renal dialysis: Secondary | ICD-10-CM | POA: Diagnosis not present

## 2019-07-21 DIAGNOSIS — N186 End stage renal disease: Secondary | ICD-10-CM | POA: Diagnosis not present

## 2019-07-23 DIAGNOSIS — Z5181 Encounter for therapeutic drug level monitoring: Secondary | ICD-10-CM | POA: Diagnosis not present

## 2019-07-23 DIAGNOSIS — N2581 Secondary hyperparathyroidism of renal origin: Secondary | ICD-10-CM | POA: Diagnosis not present

## 2019-07-23 DIAGNOSIS — I482 Chronic atrial fibrillation, unspecified: Secondary | ICD-10-CM | POA: Diagnosis not present

## 2019-07-23 DIAGNOSIS — N186 End stage renal disease: Secondary | ICD-10-CM | POA: Diagnosis not present

## 2019-07-23 DIAGNOSIS — Z992 Dependence on renal dialysis: Secondary | ICD-10-CM | POA: Diagnosis not present

## 2019-07-23 DIAGNOSIS — D509 Iron deficiency anemia, unspecified: Secondary | ICD-10-CM | POA: Diagnosis not present

## 2019-07-23 DIAGNOSIS — D631 Anemia in chronic kidney disease: Secondary | ICD-10-CM | POA: Diagnosis not present

## 2019-07-23 DIAGNOSIS — E039 Hypothyroidism, unspecified: Secondary | ICD-10-CM | POA: Diagnosis not present

## 2019-07-23 DIAGNOSIS — D688 Other specified coagulation defects: Secondary | ICD-10-CM | POA: Diagnosis not present

## 2019-07-23 DIAGNOSIS — R197 Diarrhea, unspecified: Secondary | ICD-10-CM | POA: Diagnosis not present

## 2019-07-24 DIAGNOSIS — M1712 Unilateral primary osteoarthritis, left knee: Secondary | ICD-10-CM | POA: Diagnosis not present

## 2019-07-25 DIAGNOSIS — Z5181 Encounter for therapeutic drug level monitoring: Secondary | ICD-10-CM | POA: Diagnosis not present

## 2019-07-25 DIAGNOSIS — D631 Anemia in chronic kidney disease: Secondary | ICD-10-CM | POA: Diagnosis not present

## 2019-07-25 DIAGNOSIS — E039 Hypothyroidism, unspecified: Secondary | ICD-10-CM | POA: Diagnosis not present

## 2019-07-25 DIAGNOSIS — D688 Other specified coagulation defects: Secondary | ICD-10-CM | POA: Diagnosis not present

## 2019-07-25 DIAGNOSIS — R197 Diarrhea, unspecified: Secondary | ICD-10-CM | POA: Diagnosis not present

## 2019-07-25 DIAGNOSIS — Z992 Dependence on renal dialysis: Secondary | ICD-10-CM | POA: Diagnosis not present

## 2019-07-25 DIAGNOSIS — N186 End stage renal disease: Secondary | ICD-10-CM | POA: Diagnosis not present

## 2019-07-25 DIAGNOSIS — N2581 Secondary hyperparathyroidism of renal origin: Secondary | ICD-10-CM | POA: Diagnosis not present

## 2019-07-25 DIAGNOSIS — D509 Iron deficiency anemia, unspecified: Secondary | ICD-10-CM | POA: Diagnosis not present

## 2019-07-25 DIAGNOSIS — I482 Chronic atrial fibrillation, unspecified: Secondary | ICD-10-CM | POA: Diagnosis not present

## 2019-07-27 DIAGNOSIS — N186 End stage renal disease: Secondary | ICD-10-CM | POA: Diagnosis not present

## 2019-07-27 DIAGNOSIS — D631 Anemia in chronic kidney disease: Secondary | ICD-10-CM | POA: Diagnosis not present

## 2019-07-27 DIAGNOSIS — Z5181 Encounter for therapeutic drug level monitoring: Secondary | ICD-10-CM | POA: Diagnosis not present

## 2019-07-27 DIAGNOSIS — I482 Chronic atrial fibrillation, unspecified: Secondary | ICD-10-CM | POA: Diagnosis not present

## 2019-07-27 DIAGNOSIS — Z992 Dependence on renal dialysis: Secondary | ICD-10-CM | POA: Diagnosis not present

## 2019-07-27 DIAGNOSIS — E039 Hypothyroidism, unspecified: Secondary | ICD-10-CM | POA: Diagnosis not present

## 2019-07-27 DIAGNOSIS — R197 Diarrhea, unspecified: Secondary | ICD-10-CM | POA: Diagnosis not present

## 2019-07-27 DIAGNOSIS — D688 Other specified coagulation defects: Secondary | ICD-10-CM | POA: Diagnosis not present

## 2019-07-27 DIAGNOSIS — N2581 Secondary hyperparathyroidism of renal origin: Secondary | ICD-10-CM | POA: Diagnosis not present

## 2019-07-27 DIAGNOSIS — D509 Iron deficiency anemia, unspecified: Secondary | ICD-10-CM | POA: Diagnosis not present

## 2019-07-30 DIAGNOSIS — Z5181 Encounter for therapeutic drug level monitoring: Secondary | ICD-10-CM | POA: Diagnosis not present

## 2019-07-30 DIAGNOSIS — D631 Anemia in chronic kidney disease: Secondary | ICD-10-CM | POA: Diagnosis not present

## 2019-07-30 DIAGNOSIS — I482 Chronic atrial fibrillation, unspecified: Secondary | ICD-10-CM | POA: Diagnosis not present

## 2019-07-30 DIAGNOSIS — R197 Diarrhea, unspecified: Secondary | ICD-10-CM | POA: Diagnosis not present

## 2019-07-30 DIAGNOSIS — N186 End stage renal disease: Secondary | ICD-10-CM | POA: Diagnosis not present

## 2019-07-30 DIAGNOSIS — N2581 Secondary hyperparathyroidism of renal origin: Secondary | ICD-10-CM | POA: Diagnosis not present

## 2019-07-30 DIAGNOSIS — D509 Iron deficiency anemia, unspecified: Secondary | ICD-10-CM | POA: Diagnosis not present

## 2019-07-30 DIAGNOSIS — E039 Hypothyroidism, unspecified: Secondary | ICD-10-CM | POA: Diagnosis not present

## 2019-07-30 DIAGNOSIS — Z992 Dependence on renal dialysis: Secondary | ICD-10-CM | POA: Diagnosis not present

## 2019-07-30 DIAGNOSIS — D688 Other specified coagulation defects: Secondary | ICD-10-CM | POA: Diagnosis not present

## 2019-08-01 DIAGNOSIS — N186 End stage renal disease: Secondary | ICD-10-CM | POA: Diagnosis not present

## 2019-08-01 DIAGNOSIS — Z992 Dependence on renal dialysis: Secondary | ICD-10-CM | POA: Diagnosis not present

## 2019-08-01 DIAGNOSIS — D631 Anemia in chronic kidney disease: Secondary | ICD-10-CM | POA: Diagnosis not present

## 2019-08-01 DIAGNOSIS — N2581 Secondary hyperparathyroidism of renal origin: Secondary | ICD-10-CM | POA: Diagnosis not present

## 2019-08-01 DIAGNOSIS — D509 Iron deficiency anemia, unspecified: Secondary | ICD-10-CM | POA: Diagnosis not present

## 2019-08-01 DIAGNOSIS — R197 Diarrhea, unspecified: Secondary | ICD-10-CM | POA: Diagnosis not present

## 2019-08-01 DIAGNOSIS — E039 Hypothyroidism, unspecified: Secondary | ICD-10-CM | POA: Diagnosis not present

## 2019-08-01 DIAGNOSIS — Z5181 Encounter for therapeutic drug level monitoring: Secondary | ICD-10-CM | POA: Diagnosis not present

## 2019-08-01 DIAGNOSIS — D688 Other specified coagulation defects: Secondary | ICD-10-CM | POA: Diagnosis not present

## 2019-08-01 DIAGNOSIS — I482 Chronic atrial fibrillation, unspecified: Secondary | ICD-10-CM | POA: Diagnosis not present

## 2019-08-02 ENCOUNTER — Ambulatory Visit: Payer: Medicare Other | Attending: Internal Medicine

## 2019-08-02 DIAGNOSIS — Z23 Encounter for immunization: Secondary | ICD-10-CM | POA: Insufficient documentation

## 2019-08-02 NOTE — Progress Notes (Signed)
   Covid-19 Vaccination Clinic  Name:  [name]    MRN: [id number] DOB: 1946/03/28  08/02/2019  [name] was observed post Covid-19 immunization for 15 minutes without incidence. She was provided with Vaccine Information Sheet and instruction to access the V-Safe system.   [name] was instructed to call 911 with any severe reactions post vaccine: Marland Kitchen Difficulty breathing  . Swelling of your face and throat  . A fast heartbeat  . A bad rash all over your body  . Dizziness and weakness    Immunizations Administered    Name Date Dose VIS Date Route   Pfizer COVID-19 Vaccine 08/02/2019  1:05 PM 0.3 mL 05/31/2019 Intramuscular   Manufacturer: [place]   Lot: [id number]   [place]: [id number]

## 2019-08-03 DIAGNOSIS — D631 Anemia in chronic kidney disease: Secondary | ICD-10-CM | POA: Diagnosis not present

## 2019-08-03 DIAGNOSIS — Z5181 Encounter for therapeutic drug level monitoring: Secondary | ICD-10-CM | POA: Diagnosis not present

## 2019-08-03 DIAGNOSIS — R197 Diarrhea, unspecified: Secondary | ICD-10-CM | POA: Diagnosis not present

## 2019-08-03 DIAGNOSIS — N186 End stage renal disease: Secondary | ICD-10-CM | POA: Diagnosis not present

## 2019-08-03 DIAGNOSIS — E039 Hypothyroidism, unspecified: Secondary | ICD-10-CM | POA: Diagnosis not present

## 2019-08-03 DIAGNOSIS — D509 Iron deficiency anemia, unspecified: Secondary | ICD-10-CM | POA: Diagnosis not present

## 2019-08-03 DIAGNOSIS — I482 Chronic atrial fibrillation, unspecified: Secondary | ICD-10-CM | POA: Diagnosis not present

## 2019-08-03 DIAGNOSIS — N2581 Secondary hyperparathyroidism of renal origin: Secondary | ICD-10-CM | POA: Diagnosis not present

## 2019-08-03 DIAGNOSIS — D688 Other specified coagulation defects: Secondary | ICD-10-CM | POA: Diagnosis not present

## 2019-08-03 DIAGNOSIS — Z992 Dependence on renal dialysis: Secondary | ICD-10-CM | POA: Diagnosis not present

## 2019-08-06 DIAGNOSIS — E039 Hypothyroidism, unspecified: Secondary | ICD-10-CM | POA: Diagnosis not present

## 2019-08-06 DIAGNOSIS — I482 Chronic atrial fibrillation, unspecified: Secondary | ICD-10-CM | POA: Diagnosis not present

## 2019-08-06 DIAGNOSIS — Z5181 Encounter for therapeutic drug level monitoring: Secondary | ICD-10-CM | POA: Diagnosis not present

## 2019-08-06 DIAGNOSIS — N186 End stage renal disease: Secondary | ICD-10-CM | POA: Diagnosis not present

## 2019-08-06 DIAGNOSIS — N2581 Secondary hyperparathyroidism of renal origin: Secondary | ICD-10-CM | POA: Diagnosis not present

## 2019-08-06 DIAGNOSIS — D509 Iron deficiency anemia, unspecified: Secondary | ICD-10-CM | POA: Diagnosis not present

## 2019-08-06 DIAGNOSIS — Z992 Dependence on renal dialysis: Secondary | ICD-10-CM | POA: Diagnosis not present

## 2019-08-06 DIAGNOSIS — R197 Diarrhea, unspecified: Secondary | ICD-10-CM | POA: Diagnosis not present

## 2019-08-06 DIAGNOSIS — D688 Other specified coagulation defects: Secondary | ICD-10-CM | POA: Diagnosis not present

## 2019-08-06 DIAGNOSIS — D631 Anemia in chronic kidney disease: Secondary | ICD-10-CM | POA: Diagnosis not present

## 2019-08-07 ENCOUNTER — Ambulatory Visit: Payer: Medicare Other | Admitting: Pharmacist

## 2019-08-07 ENCOUNTER — Other Ambulatory Visit: Payer: Self-pay

## 2019-08-07 DIAGNOSIS — I480 Paroxysmal atrial fibrillation: Secondary | ICD-10-CM

## 2019-08-07 DIAGNOSIS — Z5181 Encounter for therapeutic drug level monitoring: Secondary | ICD-10-CM | POA: Diagnosis not present

## 2019-08-07 DIAGNOSIS — I4891 Unspecified atrial fibrillation: Secondary | ICD-10-CM | POA: Diagnosis not present

## 2019-08-07 LAB — POCT INR: INR: 5.6 — AB (ref 2.0–3.0)

## 2019-08-07 NOTE — Patient Instructions (Signed)
Description   Skip your Coumadin for 3 days, then start taking 1/2 tablet daily except 1 tablet on Thursdays only. Recheck INR 10 days. Call with any new medications or if scheduled for any procedures or with any questions [phone]

## 2019-08-10 DIAGNOSIS — Z992 Dependence on renal dialysis: Secondary | ICD-10-CM | POA: Diagnosis not present

## 2019-08-10 DIAGNOSIS — D509 Iron deficiency anemia, unspecified: Secondary | ICD-10-CM | POA: Diagnosis not present

## 2019-08-10 DIAGNOSIS — R197 Diarrhea, unspecified: Secondary | ICD-10-CM | POA: Diagnosis not present

## 2019-08-10 DIAGNOSIS — N2581 Secondary hyperparathyroidism of renal origin: Secondary | ICD-10-CM | POA: Diagnosis not present

## 2019-08-10 DIAGNOSIS — I482 Chronic atrial fibrillation, unspecified: Secondary | ICD-10-CM | POA: Diagnosis not present

## 2019-08-10 DIAGNOSIS — N186 End stage renal disease: Secondary | ICD-10-CM | POA: Diagnosis not present

## 2019-08-10 DIAGNOSIS — D688 Other specified coagulation defects: Secondary | ICD-10-CM | POA: Diagnosis not present

## 2019-08-10 DIAGNOSIS — Z5181 Encounter for therapeutic drug level monitoring: Secondary | ICD-10-CM | POA: Diagnosis not present

## 2019-08-10 DIAGNOSIS — E039 Hypothyroidism, unspecified: Secondary | ICD-10-CM | POA: Diagnosis not present

## 2019-08-10 DIAGNOSIS — D631 Anemia in chronic kidney disease: Secondary | ICD-10-CM | POA: Diagnosis not present

## 2019-08-13 DIAGNOSIS — D688 Other specified coagulation defects: Secondary | ICD-10-CM | POA: Diagnosis not present

## 2019-08-13 DIAGNOSIS — N186 End stage renal disease: Secondary | ICD-10-CM | POA: Diagnosis not present

## 2019-08-13 DIAGNOSIS — N2581 Secondary hyperparathyroidism of renal origin: Secondary | ICD-10-CM | POA: Diagnosis not present

## 2019-08-13 DIAGNOSIS — D509 Iron deficiency anemia, unspecified: Secondary | ICD-10-CM | POA: Diagnosis not present

## 2019-08-13 DIAGNOSIS — I482 Chronic atrial fibrillation, unspecified: Secondary | ICD-10-CM | POA: Diagnosis not present

## 2019-08-13 DIAGNOSIS — D631 Anemia in chronic kidney disease: Secondary | ICD-10-CM | POA: Diagnosis not present

## 2019-08-13 DIAGNOSIS — E039 Hypothyroidism, unspecified: Secondary | ICD-10-CM | POA: Diagnosis not present

## 2019-08-13 DIAGNOSIS — Z5181 Encounter for therapeutic drug level monitoring: Secondary | ICD-10-CM | POA: Diagnosis not present

## 2019-08-13 DIAGNOSIS — R197 Diarrhea, unspecified: Secondary | ICD-10-CM | POA: Diagnosis not present

## 2019-08-13 DIAGNOSIS — Z992 Dependence on renal dialysis: Secondary | ICD-10-CM | POA: Diagnosis not present

## 2019-08-14 DIAGNOSIS — I129 Hypertensive chronic kidney disease with stage 1 through stage 4 chronic kidney disease, or unspecified chronic kidney disease: Secondary | ICD-10-CM | POA: Diagnosis not present

## 2019-08-14 DIAGNOSIS — I480 Paroxysmal atrial fibrillation: Secondary | ICD-10-CM | POA: Diagnosis not present

## 2019-08-14 DIAGNOSIS — N186 End stage renal disease: Secondary | ICD-10-CM | POA: Diagnosis not present

## 2019-08-14 DIAGNOSIS — R0600 Dyspnea, unspecified: Secondary | ICD-10-CM | POA: Diagnosis not present

## 2019-08-15 DIAGNOSIS — D509 Iron deficiency anemia, unspecified: Secondary | ICD-10-CM | POA: Diagnosis not present

## 2019-08-15 DIAGNOSIS — N2581 Secondary hyperparathyroidism of renal origin: Secondary | ICD-10-CM | POA: Diagnosis not present

## 2019-08-15 DIAGNOSIS — N186 End stage renal disease: Secondary | ICD-10-CM | POA: Diagnosis not present

## 2019-08-15 DIAGNOSIS — D631 Anemia in chronic kidney disease: Secondary | ICD-10-CM | POA: Diagnosis not present

## 2019-08-15 DIAGNOSIS — E039 Hypothyroidism, unspecified: Secondary | ICD-10-CM | POA: Diagnosis not present

## 2019-08-15 DIAGNOSIS — R197 Diarrhea, unspecified: Secondary | ICD-10-CM | POA: Diagnosis not present

## 2019-08-15 DIAGNOSIS — Z5181 Encounter for therapeutic drug level monitoring: Secondary | ICD-10-CM | POA: Diagnosis not present

## 2019-08-15 DIAGNOSIS — I482 Chronic atrial fibrillation, unspecified: Secondary | ICD-10-CM | POA: Diagnosis not present

## 2019-08-15 DIAGNOSIS — D688 Other specified coagulation defects: Secondary | ICD-10-CM | POA: Diagnosis not present

## 2019-08-15 DIAGNOSIS — Z992 Dependence on renal dialysis: Secondary | ICD-10-CM | POA: Diagnosis not present

## 2019-08-17 DIAGNOSIS — N2581 Secondary hyperparathyroidism of renal origin: Secondary | ICD-10-CM | POA: Diagnosis not present

## 2019-08-17 DIAGNOSIS — N186 End stage renal disease: Secondary | ICD-10-CM | POA: Diagnosis not present

## 2019-08-17 DIAGNOSIS — D509 Iron deficiency anemia, unspecified: Secondary | ICD-10-CM | POA: Diagnosis not present

## 2019-08-17 DIAGNOSIS — D688 Other specified coagulation defects: Secondary | ICD-10-CM | POA: Diagnosis not present

## 2019-08-17 DIAGNOSIS — Z992 Dependence on renal dialysis: Secondary | ICD-10-CM | POA: Diagnosis not present

## 2019-08-17 DIAGNOSIS — I482 Chronic atrial fibrillation, unspecified: Secondary | ICD-10-CM | POA: Diagnosis not present

## 2019-08-17 DIAGNOSIS — D631 Anemia in chronic kidney disease: Secondary | ICD-10-CM | POA: Diagnosis not present

## 2019-08-17 DIAGNOSIS — Z5181 Encounter for therapeutic drug level monitoring: Secondary | ICD-10-CM | POA: Diagnosis not present

## 2019-08-17 DIAGNOSIS — E039 Hypothyroidism, unspecified: Secondary | ICD-10-CM | POA: Diagnosis not present

## 2019-08-17 DIAGNOSIS — R197 Diarrhea, unspecified: Secondary | ICD-10-CM | POA: Diagnosis not present

## 2019-08-18 DIAGNOSIS — N186 End stage renal disease: Secondary | ICD-10-CM | POA: Diagnosis not present

## 2019-08-18 DIAGNOSIS — Z992 Dependence on renal dialysis: Secondary | ICD-10-CM | POA: Diagnosis not present

## 2019-08-18 DIAGNOSIS — I129 Hypertensive chronic kidney disease with stage 1 through stage 4 chronic kidney disease, or unspecified chronic kidney disease: Secondary | ICD-10-CM | POA: Diagnosis not present

## 2019-08-19 ENCOUNTER — Other Ambulatory Visit: Payer: Self-pay

## 2019-08-19 ENCOUNTER — Ambulatory Visit: Payer: Medicare Other | Admitting: Pharmacist

## 2019-08-19 DIAGNOSIS — Z5181 Encounter for therapeutic drug level monitoring: Secondary | ICD-10-CM | POA: Diagnosis not present

## 2019-08-19 DIAGNOSIS — I480 Paroxysmal atrial fibrillation: Secondary | ICD-10-CM

## 2019-08-19 DIAGNOSIS — I4891 Unspecified atrial fibrillation: Secondary | ICD-10-CM

## 2019-08-19 LAB — POCT INR: INR: 2.7 (ref 2.0–3.0)

## 2019-08-19 NOTE — Patient Instructions (Signed)
Description   Continue taking 1/2 tablet daily except 1 tablet on Thursdays only. Recheck INR in 3 weeks. Call with any new medications or if scheduled for any procedures or with any questions [phone]

## 2019-08-20 DIAGNOSIS — N186 End stage renal disease: Secondary | ICD-10-CM | POA: Diagnosis not present

## 2019-08-20 DIAGNOSIS — Z5181 Encounter for therapeutic drug level monitoring: Secondary | ICD-10-CM | POA: Diagnosis not present

## 2019-08-20 DIAGNOSIS — D688 Other specified coagulation defects: Secondary | ICD-10-CM | POA: Diagnosis not present

## 2019-08-20 DIAGNOSIS — R519 Headache, unspecified: Secondary | ICD-10-CM | POA: Diagnosis not present

## 2019-08-20 DIAGNOSIS — D509 Iron deficiency anemia, unspecified: Secondary | ICD-10-CM | POA: Diagnosis not present

## 2019-08-20 DIAGNOSIS — E039 Hypothyroidism, unspecified: Secondary | ICD-10-CM | POA: Diagnosis not present

## 2019-08-20 DIAGNOSIS — I482 Chronic atrial fibrillation, unspecified: Secondary | ICD-10-CM | POA: Diagnosis not present

## 2019-08-20 DIAGNOSIS — D631 Anemia in chronic kidney disease: Secondary | ICD-10-CM | POA: Diagnosis not present

## 2019-08-20 DIAGNOSIS — N2581 Secondary hyperparathyroidism of renal origin: Secondary | ICD-10-CM | POA: Diagnosis not present

## 2019-08-20 DIAGNOSIS — R197 Diarrhea, unspecified: Secondary | ICD-10-CM | POA: Diagnosis not present

## 2019-08-20 DIAGNOSIS — Z992 Dependence on renal dialysis: Secondary | ICD-10-CM | POA: Diagnosis not present

## 2019-08-21 DIAGNOSIS — M1712 Unilateral primary osteoarthritis, left knee: Secondary | ICD-10-CM | POA: Diagnosis not present

## 2019-08-22 DIAGNOSIS — Z5181 Encounter for therapeutic drug level monitoring: Secondary | ICD-10-CM | POA: Diagnosis not present

## 2019-08-22 DIAGNOSIS — D631 Anemia in chronic kidney disease: Secondary | ICD-10-CM | POA: Diagnosis not present

## 2019-08-22 DIAGNOSIS — E039 Hypothyroidism, unspecified: Secondary | ICD-10-CM | POA: Diagnosis not present

## 2019-08-22 DIAGNOSIS — R519 Headache, unspecified: Secondary | ICD-10-CM | POA: Diagnosis not present

## 2019-08-22 DIAGNOSIS — I482 Chronic atrial fibrillation, unspecified: Secondary | ICD-10-CM | POA: Diagnosis not present

## 2019-08-22 DIAGNOSIS — D509 Iron deficiency anemia, unspecified: Secondary | ICD-10-CM | POA: Diagnosis not present

## 2019-08-22 DIAGNOSIS — Z992 Dependence on renal dialysis: Secondary | ICD-10-CM | POA: Diagnosis not present

## 2019-08-22 DIAGNOSIS — N2581 Secondary hyperparathyroidism of renal origin: Secondary | ICD-10-CM | POA: Diagnosis not present

## 2019-08-22 DIAGNOSIS — R197 Diarrhea, unspecified: Secondary | ICD-10-CM | POA: Diagnosis not present

## 2019-08-22 DIAGNOSIS — D688 Other specified coagulation defects: Secondary | ICD-10-CM | POA: Diagnosis not present

## 2019-08-22 DIAGNOSIS — N186 End stage renal disease: Secondary | ICD-10-CM | POA: Diagnosis not present

## 2019-08-24 DIAGNOSIS — D631 Anemia in chronic kidney disease: Secondary | ICD-10-CM | POA: Diagnosis not present

## 2019-08-24 DIAGNOSIS — N186 End stage renal disease: Secondary | ICD-10-CM | POA: Diagnosis not present

## 2019-08-24 DIAGNOSIS — D688 Other specified coagulation defects: Secondary | ICD-10-CM | POA: Diagnosis not present

## 2019-08-24 DIAGNOSIS — R519 Headache, unspecified: Secondary | ICD-10-CM | POA: Diagnosis not present

## 2019-08-24 DIAGNOSIS — E039 Hypothyroidism, unspecified: Secondary | ICD-10-CM | POA: Diagnosis not present

## 2019-08-24 DIAGNOSIS — Z992 Dependence on renal dialysis: Secondary | ICD-10-CM | POA: Diagnosis not present

## 2019-08-24 DIAGNOSIS — D509 Iron deficiency anemia, unspecified: Secondary | ICD-10-CM | POA: Diagnosis not present

## 2019-08-24 DIAGNOSIS — Z5181 Encounter for therapeutic drug level monitoring: Secondary | ICD-10-CM | POA: Diagnosis not present

## 2019-08-24 DIAGNOSIS — R197 Diarrhea, unspecified: Secondary | ICD-10-CM | POA: Diagnosis not present

## 2019-08-24 DIAGNOSIS — I482 Chronic atrial fibrillation, unspecified: Secondary | ICD-10-CM | POA: Diagnosis not present

## 2019-08-24 DIAGNOSIS — N2581 Secondary hyperparathyroidism of renal origin: Secondary | ICD-10-CM | POA: Diagnosis not present

## 2019-08-26 ENCOUNTER — Telehealth: Payer: Self-pay | Admitting: *Deleted

## 2019-08-26 DIAGNOSIS — R0609 Other forms of dyspnea: Secondary | ICD-10-CM

## 2019-08-26 DIAGNOSIS — R0600 Dyspnea, unspecified: Secondary | ICD-10-CM

## 2019-08-26 NOTE — Telephone Encounter (Signed)
   [facility] Pre-operative Risk Assessment    Request for surgical clearance:  1. What type of surgery is being performed? LEFT TOTAL KNEE REPLACEMENT    2. When is this surgery scheduled? TBD   3. What type of clearance is required (medical clearance vs. Pharmacy clearance to hold med vs. Both)? PHARM  4. Are there any medications that need to be held prior to surgery and how long? WARFARIN   5. Practice name and name of physician performing surgery? [facility]; DR. Vonna Kotyk LANDAU   6. What is your office phone number [phone] EXT [phone] ATTN: SHERRI    7.   What is your office fax number [phone] ATTN: SHERRI  8.   Anesthesia type (None, local, MAC, general) ? CHOICE   Julaine Hua 08/26/2019, 9:21 AM  _________________________________________________________________   (provider comments below)

## 2019-08-26 NOTE — Telephone Encounter (Signed)
Left voicemail to call back.  Pharmacy to review anticoagulation. 

## 2019-08-26 NOTE — Telephone Encounter (Signed)
On warfarin for atrial fibrillation. CHADS2VASC score 4 (CHF, hypertension, age, sex). Undergoing TKA.  Hold warfarin for 5 days prior to procedure.

## 2019-08-27 DIAGNOSIS — Z5181 Encounter for therapeutic drug level monitoring: Secondary | ICD-10-CM | POA: Diagnosis not present

## 2019-08-27 DIAGNOSIS — D509 Iron deficiency anemia, unspecified: Secondary | ICD-10-CM | POA: Diagnosis not present

## 2019-08-27 DIAGNOSIS — E039 Hypothyroidism, unspecified: Secondary | ICD-10-CM | POA: Diagnosis not present

## 2019-08-27 DIAGNOSIS — R197 Diarrhea, unspecified: Secondary | ICD-10-CM | POA: Diagnosis not present

## 2019-08-27 DIAGNOSIS — D631 Anemia in chronic kidney disease: Secondary | ICD-10-CM | POA: Diagnosis not present

## 2019-08-27 DIAGNOSIS — N2581 Secondary hyperparathyroidism of renal origin: Secondary | ICD-10-CM | POA: Diagnosis not present

## 2019-08-27 DIAGNOSIS — D688 Other specified coagulation defects: Secondary | ICD-10-CM | POA: Diagnosis not present

## 2019-08-27 DIAGNOSIS — N186 End stage renal disease: Secondary | ICD-10-CM | POA: Diagnosis not present

## 2019-08-27 DIAGNOSIS — R519 Headache, unspecified: Secondary | ICD-10-CM | POA: Diagnosis not present

## 2019-08-27 DIAGNOSIS — I482 Chronic atrial fibrillation, unspecified: Secondary | ICD-10-CM | POA: Diagnosis not present

## 2019-08-27 DIAGNOSIS — Z992 Dependence on renal dialysis: Secondary | ICD-10-CM | POA: Diagnosis not present

## 2019-08-28 NOTE — Telephone Encounter (Signed)
I called [name] regarding clearance for surgery. She reports that she has been having some DOE with walking more distance, resolves with rest, for about the last 2-3 months. Also having some shortness of breath when she bends over. No chest pain/pressure/tightness. She has some mild ankle edema. Her fluid status is managed with dialysis and she says that they have been able to pull of the appropriate amount of fluid each time. She says that her PCP, Dr. Felipa Eth, had felt that she should have an echo. She has hx of prior DCM with low EF, improved to normal, and mild AS. Her last echo was in 2018.  I will order an echocardiogram to be done prior to her surgery. Will address clearance after echo.

## 2019-08-29 DIAGNOSIS — N186 End stage renal disease: Secondary | ICD-10-CM | POA: Diagnosis not present

## 2019-08-29 DIAGNOSIS — Z5181 Encounter for therapeutic drug level monitoring: Secondary | ICD-10-CM | POA: Diagnosis not present

## 2019-08-29 DIAGNOSIS — Z992 Dependence on renal dialysis: Secondary | ICD-10-CM | POA: Diagnosis not present

## 2019-08-29 DIAGNOSIS — E039 Hypothyroidism, unspecified: Secondary | ICD-10-CM | POA: Diagnosis not present

## 2019-08-29 DIAGNOSIS — R197 Diarrhea, unspecified: Secondary | ICD-10-CM | POA: Diagnosis not present

## 2019-08-29 DIAGNOSIS — R519 Headache, unspecified: Secondary | ICD-10-CM | POA: Diagnosis not present

## 2019-08-29 DIAGNOSIS — D509 Iron deficiency anemia, unspecified: Secondary | ICD-10-CM | POA: Diagnosis not present

## 2019-08-29 DIAGNOSIS — I482 Chronic atrial fibrillation, unspecified: Secondary | ICD-10-CM | POA: Diagnosis not present

## 2019-08-29 DIAGNOSIS — D688 Other specified coagulation defects: Secondary | ICD-10-CM | POA: Diagnosis not present

## 2019-08-29 DIAGNOSIS — N2581 Secondary hyperparathyroidism of renal origin: Secondary | ICD-10-CM | POA: Diagnosis not present

## 2019-08-29 DIAGNOSIS — D631 Anemia in chronic kidney disease: Secondary | ICD-10-CM | POA: Diagnosis not present

## 2019-08-29 NOTE — Telephone Encounter (Signed)
Pre-op call back, Pt is scheduled for echo on 3/29. Please let requesting provider know that surgical clearance is pending review of echo results.

## 2019-08-30 NOTE — Telephone Encounter (Signed)
I left message for [name], Dr. Luanna Cole surgery scheduler pt has echo set for 09/16/19 for pre op clearance. We will send clearance once echo has been read and pt has been cleared.

## 2019-08-31 DIAGNOSIS — R197 Diarrhea, unspecified: Secondary | ICD-10-CM | POA: Diagnosis not present

## 2019-08-31 DIAGNOSIS — D631 Anemia in chronic kidney disease: Secondary | ICD-10-CM | POA: Diagnosis not present

## 2019-08-31 DIAGNOSIS — N186 End stage renal disease: Secondary | ICD-10-CM | POA: Diagnosis not present

## 2019-08-31 DIAGNOSIS — N2581 Secondary hyperparathyroidism of renal origin: Secondary | ICD-10-CM | POA: Diagnosis not present

## 2019-08-31 DIAGNOSIS — R519 Headache, unspecified: Secondary | ICD-10-CM | POA: Diagnosis not present

## 2019-08-31 DIAGNOSIS — Z992 Dependence on renal dialysis: Secondary | ICD-10-CM | POA: Diagnosis not present

## 2019-08-31 DIAGNOSIS — D688 Other specified coagulation defects: Secondary | ICD-10-CM | POA: Diagnosis not present

## 2019-08-31 DIAGNOSIS — Z5181 Encounter for therapeutic drug level monitoring: Secondary | ICD-10-CM | POA: Diagnosis not present

## 2019-08-31 DIAGNOSIS — D509 Iron deficiency anemia, unspecified: Secondary | ICD-10-CM | POA: Diagnosis not present

## 2019-08-31 DIAGNOSIS — I482 Chronic atrial fibrillation, unspecified: Secondary | ICD-10-CM | POA: Diagnosis not present

## 2019-08-31 DIAGNOSIS — E039 Hypothyroidism, unspecified: Secondary | ICD-10-CM | POA: Diagnosis not present

## 2019-09-03 DIAGNOSIS — N2581 Secondary hyperparathyroidism of renal origin: Secondary | ICD-10-CM | POA: Diagnosis not present

## 2019-09-03 DIAGNOSIS — E039 Hypothyroidism, unspecified: Secondary | ICD-10-CM | POA: Diagnosis not present

## 2019-09-03 DIAGNOSIS — D631 Anemia in chronic kidney disease: Secondary | ICD-10-CM | POA: Diagnosis not present

## 2019-09-03 DIAGNOSIS — D688 Other specified coagulation defects: Secondary | ICD-10-CM | POA: Diagnosis not present

## 2019-09-03 DIAGNOSIS — Z992 Dependence on renal dialysis: Secondary | ICD-10-CM | POA: Diagnosis not present

## 2019-09-03 DIAGNOSIS — D509 Iron deficiency anemia, unspecified: Secondary | ICD-10-CM | POA: Diagnosis not present

## 2019-09-03 DIAGNOSIS — I482 Chronic atrial fibrillation, unspecified: Secondary | ICD-10-CM | POA: Diagnosis not present

## 2019-09-03 DIAGNOSIS — R197 Diarrhea, unspecified: Secondary | ICD-10-CM | POA: Diagnosis not present

## 2019-09-03 DIAGNOSIS — Z5181 Encounter for therapeutic drug level monitoring: Secondary | ICD-10-CM | POA: Diagnosis not present

## 2019-09-03 DIAGNOSIS — R519 Headache, unspecified: Secondary | ICD-10-CM | POA: Diagnosis not present

## 2019-09-03 DIAGNOSIS — N186 End stage renal disease: Secondary | ICD-10-CM | POA: Diagnosis not present

## 2019-09-05 DIAGNOSIS — Z5181 Encounter for therapeutic drug level monitoring: Secondary | ICD-10-CM | POA: Diagnosis not present

## 2019-09-05 DIAGNOSIS — R519 Headache, unspecified: Secondary | ICD-10-CM | POA: Diagnosis not present

## 2019-09-05 DIAGNOSIS — D688 Other specified coagulation defects: Secondary | ICD-10-CM | POA: Diagnosis not present

## 2019-09-05 DIAGNOSIS — R197 Diarrhea, unspecified: Secondary | ICD-10-CM | POA: Diagnosis not present

## 2019-09-05 DIAGNOSIS — N2581 Secondary hyperparathyroidism of renal origin: Secondary | ICD-10-CM | POA: Diagnosis not present

## 2019-09-05 DIAGNOSIS — E039 Hypothyroidism, unspecified: Secondary | ICD-10-CM | POA: Diagnosis not present

## 2019-09-05 DIAGNOSIS — N186 End stage renal disease: Secondary | ICD-10-CM | POA: Diagnosis not present

## 2019-09-05 DIAGNOSIS — Z992 Dependence on renal dialysis: Secondary | ICD-10-CM | POA: Diagnosis not present

## 2019-09-05 DIAGNOSIS — D631 Anemia in chronic kidney disease: Secondary | ICD-10-CM | POA: Diagnosis not present

## 2019-09-05 DIAGNOSIS — D509 Iron deficiency anemia, unspecified: Secondary | ICD-10-CM | POA: Diagnosis not present

## 2019-09-05 DIAGNOSIS — I482 Chronic atrial fibrillation, unspecified: Secondary | ICD-10-CM | POA: Diagnosis not present

## 2019-09-07 DIAGNOSIS — D631 Anemia in chronic kidney disease: Secondary | ICD-10-CM | POA: Diagnosis not present

## 2019-09-07 DIAGNOSIS — I482 Chronic atrial fibrillation, unspecified: Secondary | ICD-10-CM | POA: Diagnosis not present

## 2019-09-07 DIAGNOSIS — Z5181 Encounter for therapeutic drug level monitoring: Secondary | ICD-10-CM | POA: Diagnosis not present

## 2019-09-07 DIAGNOSIS — D688 Other specified coagulation defects: Secondary | ICD-10-CM | POA: Diagnosis not present

## 2019-09-07 DIAGNOSIS — Z992 Dependence on renal dialysis: Secondary | ICD-10-CM | POA: Diagnosis not present

## 2019-09-07 DIAGNOSIS — R197 Diarrhea, unspecified: Secondary | ICD-10-CM | POA: Diagnosis not present

## 2019-09-07 DIAGNOSIS — D509 Iron deficiency anemia, unspecified: Secondary | ICD-10-CM | POA: Diagnosis not present

## 2019-09-07 DIAGNOSIS — N186 End stage renal disease: Secondary | ICD-10-CM | POA: Diagnosis not present

## 2019-09-07 DIAGNOSIS — E039 Hypothyroidism, unspecified: Secondary | ICD-10-CM | POA: Diagnosis not present

## 2019-09-07 DIAGNOSIS — N2581 Secondary hyperparathyroidism of renal origin: Secondary | ICD-10-CM | POA: Diagnosis not present

## 2019-09-07 DIAGNOSIS — R519 Headache, unspecified: Secondary | ICD-10-CM | POA: Diagnosis not present

## 2019-09-10 DIAGNOSIS — N186 End stage renal disease: Secondary | ICD-10-CM | POA: Diagnosis not present

## 2019-09-10 DIAGNOSIS — N2581 Secondary hyperparathyroidism of renal origin: Secondary | ICD-10-CM | POA: Diagnosis not present

## 2019-09-10 DIAGNOSIS — D509 Iron deficiency anemia, unspecified: Secondary | ICD-10-CM | POA: Diagnosis not present

## 2019-09-10 DIAGNOSIS — Z5181 Encounter for therapeutic drug level monitoring: Secondary | ICD-10-CM | POA: Diagnosis not present

## 2019-09-10 DIAGNOSIS — E039 Hypothyroidism, unspecified: Secondary | ICD-10-CM | POA: Diagnosis not present

## 2019-09-10 DIAGNOSIS — R197 Diarrhea, unspecified: Secondary | ICD-10-CM | POA: Diagnosis not present

## 2019-09-10 DIAGNOSIS — I482 Chronic atrial fibrillation, unspecified: Secondary | ICD-10-CM | POA: Diagnosis not present

## 2019-09-10 DIAGNOSIS — D631 Anemia in chronic kidney disease: Secondary | ICD-10-CM | POA: Diagnosis not present

## 2019-09-10 DIAGNOSIS — Z992 Dependence on renal dialysis: Secondary | ICD-10-CM | POA: Diagnosis not present

## 2019-09-10 DIAGNOSIS — R519 Headache, unspecified: Secondary | ICD-10-CM | POA: Diagnosis not present

## 2019-09-10 DIAGNOSIS — D688 Other specified coagulation defects: Secondary | ICD-10-CM | POA: Diagnosis not present

## 2019-09-12 DIAGNOSIS — Z992 Dependence on renal dialysis: Secondary | ICD-10-CM | POA: Diagnosis not present

## 2019-09-12 DIAGNOSIS — R519 Headache, unspecified: Secondary | ICD-10-CM | POA: Diagnosis not present

## 2019-09-12 DIAGNOSIS — Z5181 Encounter for therapeutic drug level monitoring: Secondary | ICD-10-CM | POA: Diagnosis not present

## 2019-09-12 DIAGNOSIS — N186 End stage renal disease: Secondary | ICD-10-CM | POA: Diagnosis not present

## 2019-09-12 DIAGNOSIS — E039 Hypothyroidism, unspecified: Secondary | ICD-10-CM | POA: Diagnosis not present

## 2019-09-12 DIAGNOSIS — D509 Iron deficiency anemia, unspecified: Secondary | ICD-10-CM | POA: Diagnosis not present

## 2019-09-12 DIAGNOSIS — R197 Diarrhea, unspecified: Secondary | ICD-10-CM | POA: Diagnosis not present

## 2019-09-12 DIAGNOSIS — D688 Other specified coagulation defects: Secondary | ICD-10-CM | POA: Diagnosis not present

## 2019-09-12 DIAGNOSIS — I482 Chronic atrial fibrillation, unspecified: Secondary | ICD-10-CM | POA: Diagnosis not present

## 2019-09-12 DIAGNOSIS — D631 Anemia in chronic kidney disease: Secondary | ICD-10-CM | POA: Diagnosis not present

## 2019-09-12 DIAGNOSIS — N2581 Secondary hyperparathyroidism of renal origin: Secondary | ICD-10-CM | POA: Diagnosis not present

## 2019-09-14 DIAGNOSIS — D688 Other specified coagulation defects: Secondary | ICD-10-CM | POA: Diagnosis not present

## 2019-09-14 DIAGNOSIS — Z5181 Encounter for therapeutic drug level monitoring: Secondary | ICD-10-CM | POA: Diagnosis not present

## 2019-09-14 DIAGNOSIS — R197 Diarrhea, unspecified: Secondary | ICD-10-CM | POA: Diagnosis not present

## 2019-09-14 DIAGNOSIS — N186 End stage renal disease: Secondary | ICD-10-CM | POA: Diagnosis not present

## 2019-09-14 DIAGNOSIS — Z992 Dependence on renal dialysis: Secondary | ICD-10-CM | POA: Diagnosis not present

## 2019-09-14 DIAGNOSIS — D509 Iron deficiency anemia, unspecified: Secondary | ICD-10-CM | POA: Diagnosis not present

## 2019-09-14 DIAGNOSIS — D631 Anemia in chronic kidney disease: Secondary | ICD-10-CM | POA: Diagnosis not present

## 2019-09-14 DIAGNOSIS — N2581 Secondary hyperparathyroidism of renal origin: Secondary | ICD-10-CM | POA: Diagnosis not present

## 2019-09-14 DIAGNOSIS — I482 Chronic atrial fibrillation, unspecified: Secondary | ICD-10-CM | POA: Diagnosis not present

## 2019-09-14 DIAGNOSIS — E039 Hypothyroidism, unspecified: Secondary | ICD-10-CM | POA: Diagnosis not present

## 2019-09-14 DIAGNOSIS — R519 Headache, unspecified: Secondary | ICD-10-CM | POA: Diagnosis not present

## 2019-09-16 ENCOUNTER — Ambulatory Visit: Payer: Medicare Other | Admitting: *Deleted

## 2019-09-16 ENCOUNTER — Other Ambulatory Visit: Payer: Self-pay

## 2019-09-16 ENCOUNTER — Ambulatory Visit (HOSPITAL_COMMUNITY): Payer: Medicare Other | Attending: Cardiology

## 2019-09-16 DIAGNOSIS — I480 Paroxysmal atrial fibrillation: Secondary | ICD-10-CM | POA: Diagnosis not present

## 2019-09-16 DIAGNOSIS — R0600 Dyspnea, unspecified: Secondary | ICD-10-CM

## 2019-09-16 DIAGNOSIS — I4891 Unspecified atrial fibrillation: Secondary | ICD-10-CM

## 2019-09-16 DIAGNOSIS — R0609 Other forms of dyspnea: Secondary | ICD-10-CM

## 2019-09-16 DIAGNOSIS — Z5181 Encounter for therapeutic drug level monitoring: Secondary | ICD-10-CM

## 2019-09-16 LAB — POCT INR: INR: 3.4 — AB (ref 2.0–3.0)

## 2019-09-16 NOTE — Patient Instructions (Signed)
Description   Hold warfarin today, then continue taking 1/2 tablet daily except 1 tablet on Thursdays only. Recheck INR in 3 weeks. Call with any new medications or if scheduled for any procedures or with any questions [phone]

## 2019-09-17 DIAGNOSIS — E039 Hypothyroidism, unspecified: Secondary | ICD-10-CM | POA: Diagnosis not present

## 2019-09-17 DIAGNOSIS — Z5181 Encounter for therapeutic drug level monitoring: Secondary | ICD-10-CM | POA: Diagnosis not present

## 2019-09-17 DIAGNOSIS — D509 Iron deficiency anemia, unspecified: Secondary | ICD-10-CM | POA: Diagnosis not present

## 2019-09-17 DIAGNOSIS — N186 End stage renal disease: Secondary | ICD-10-CM | POA: Diagnosis not present

## 2019-09-17 DIAGNOSIS — R197 Diarrhea, unspecified: Secondary | ICD-10-CM | POA: Diagnosis not present

## 2019-09-17 DIAGNOSIS — R519 Headache, unspecified: Secondary | ICD-10-CM | POA: Diagnosis not present

## 2019-09-17 DIAGNOSIS — Z992 Dependence on renal dialysis: Secondary | ICD-10-CM | POA: Diagnosis not present

## 2019-09-17 DIAGNOSIS — D688 Other specified coagulation defects: Secondary | ICD-10-CM | POA: Diagnosis not present

## 2019-09-17 DIAGNOSIS — I482 Chronic atrial fibrillation, unspecified: Secondary | ICD-10-CM | POA: Diagnosis not present

## 2019-09-17 DIAGNOSIS — N2581 Secondary hyperparathyroidism of renal origin: Secondary | ICD-10-CM | POA: Diagnosis not present

## 2019-09-17 DIAGNOSIS — D631 Anemia in chronic kidney disease: Secondary | ICD-10-CM | POA: Diagnosis not present

## 2019-09-18 DIAGNOSIS — N186 End stage renal disease: Secondary | ICD-10-CM | POA: Diagnosis not present

## 2019-09-18 DIAGNOSIS — Z992 Dependence on renal dialysis: Secondary | ICD-10-CM | POA: Diagnosis not present

## 2019-09-18 DIAGNOSIS — I129 Hypertensive chronic kidney disease with stage 1 through stage 4 chronic kidney disease, or unspecified chronic kidney disease: Secondary | ICD-10-CM | POA: Diagnosis not present

## 2019-09-19 DIAGNOSIS — R52 Pain, unspecified: Secondary | ICD-10-CM | POA: Diagnosis not present

## 2019-09-19 DIAGNOSIS — Z992 Dependence on renal dialysis: Secondary | ICD-10-CM | POA: Diagnosis not present

## 2019-09-19 DIAGNOSIS — D688 Other specified coagulation defects: Secondary | ICD-10-CM | POA: Diagnosis not present

## 2019-09-19 DIAGNOSIS — R519 Headache, unspecified: Secondary | ICD-10-CM | POA: Diagnosis not present

## 2019-09-19 DIAGNOSIS — N2581 Secondary hyperparathyroidism of renal origin: Secondary | ICD-10-CM | POA: Diagnosis not present

## 2019-09-19 DIAGNOSIS — Z5181 Encounter for therapeutic drug level monitoring: Secondary | ICD-10-CM | POA: Diagnosis not present

## 2019-09-19 DIAGNOSIS — E039 Hypothyroidism, unspecified: Secondary | ICD-10-CM | POA: Diagnosis not present

## 2019-09-19 DIAGNOSIS — D509 Iron deficiency anemia, unspecified: Secondary | ICD-10-CM | POA: Diagnosis not present

## 2019-09-19 DIAGNOSIS — R197 Diarrhea, unspecified: Secondary | ICD-10-CM | POA: Diagnosis not present

## 2019-09-19 DIAGNOSIS — N186 End stage renal disease: Secondary | ICD-10-CM | POA: Diagnosis not present

## 2019-09-19 DIAGNOSIS — I482 Chronic atrial fibrillation, unspecified: Secondary | ICD-10-CM | POA: Diagnosis not present

## 2019-09-20 NOTE — Telephone Encounter (Signed)
With normal echo results, I am ok clearing her if she has not had any recent sx of chest pain or SHOB.  I last saw her in 05/2019.  If we can get a sense that she is not having sx with walking, I can clear her, or even do a televisit if that is easier for her.  JV

## 2019-09-21 DIAGNOSIS — I482 Chronic atrial fibrillation, unspecified: Secondary | ICD-10-CM | POA: Diagnosis not present

## 2019-09-21 DIAGNOSIS — D688 Other specified coagulation defects: Secondary | ICD-10-CM | POA: Diagnosis not present

## 2019-09-21 DIAGNOSIS — N2581 Secondary hyperparathyroidism of renal origin: Secondary | ICD-10-CM | POA: Diagnosis not present

## 2019-09-21 DIAGNOSIS — Z5181 Encounter for therapeutic drug level monitoring: Secondary | ICD-10-CM | POA: Diagnosis not present

## 2019-09-21 DIAGNOSIS — N186 End stage renal disease: Secondary | ICD-10-CM | POA: Diagnosis not present

## 2019-09-21 DIAGNOSIS — R519 Headache, unspecified: Secondary | ICD-10-CM | POA: Diagnosis not present

## 2019-09-21 DIAGNOSIS — R52 Pain, unspecified: Secondary | ICD-10-CM | POA: Diagnosis not present

## 2019-09-21 DIAGNOSIS — D509 Iron deficiency anemia, unspecified: Secondary | ICD-10-CM | POA: Diagnosis not present

## 2019-09-21 DIAGNOSIS — Z992 Dependence on renal dialysis: Secondary | ICD-10-CM | POA: Diagnosis not present

## 2019-09-21 DIAGNOSIS — E039 Hypothyroidism, unspecified: Secondary | ICD-10-CM | POA: Diagnosis not present

## 2019-09-21 DIAGNOSIS — R197 Diarrhea, unspecified: Secondary | ICD-10-CM | POA: Diagnosis not present

## 2019-09-23 NOTE — Telephone Encounter (Signed)
Called and spoke to patient. She denies having any chest pain, but states that she has had some SOB with walking and exertion. She states that she would like to keep appointment on 4/12 to discuss with Dr. Irish Lack.

## 2019-09-24 DIAGNOSIS — Z5181 Encounter for therapeutic drug level monitoring: Secondary | ICD-10-CM | POA: Diagnosis not present

## 2019-09-24 DIAGNOSIS — D509 Iron deficiency anemia, unspecified: Secondary | ICD-10-CM | POA: Diagnosis not present

## 2019-09-24 DIAGNOSIS — D688 Other specified coagulation defects: Secondary | ICD-10-CM | POA: Diagnosis not present

## 2019-09-24 DIAGNOSIS — N186 End stage renal disease: Secondary | ICD-10-CM | POA: Diagnosis not present

## 2019-09-24 DIAGNOSIS — N2581 Secondary hyperparathyroidism of renal origin: Secondary | ICD-10-CM | POA: Diagnosis not present

## 2019-09-24 DIAGNOSIS — I482 Chronic atrial fibrillation, unspecified: Secondary | ICD-10-CM | POA: Diagnosis not present

## 2019-09-24 DIAGNOSIS — R52 Pain, unspecified: Secondary | ICD-10-CM | POA: Diagnosis not present

## 2019-09-24 DIAGNOSIS — E039 Hypothyroidism, unspecified: Secondary | ICD-10-CM | POA: Diagnosis not present

## 2019-09-24 DIAGNOSIS — R197 Diarrhea, unspecified: Secondary | ICD-10-CM | POA: Diagnosis not present

## 2019-09-24 DIAGNOSIS — R519 Headache, unspecified: Secondary | ICD-10-CM | POA: Diagnosis not present

## 2019-09-24 DIAGNOSIS — Z992 Dependence on renal dialysis: Secondary | ICD-10-CM | POA: Diagnosis not present

## 2019-09-26 DIAGNOSIS — R197 Diarrhea, unspecified: Secondary | ICD-10-CM | POA: Diagnosis not present

## 2019-09-26 DIAGNOSIS — N2581 Secondary hyperparathyroidism of renal origin: Secondary | ICD-10-CM | POA: Diagnosis not present

## 2019-09-26 DIAGNOSIS — Z5181 Encounter for therapeutic drug level monitoring: Secondary | ICD-10-CM | POA: Diagnosis not present

## 2019-09-26 DIAGNOSIS — R519 Headache, unspecified: Secondary | ICD-10-CM | POA: Diagnosis not present

## 2019-09-26 DIAGNOSIS — N186 End stage renal disease: Secondary | ICD-10-CM | POA: Diagnosis not present

## 2019-09-26 DIAGNOSIS — I482 Chronic atrial fibrillation, unspecified: Secondary | ICD-10-CM | POA: Diagnosis not present

## 2019-09-26 DIAGNOSIS — D509 Iron deficiency anemia, unspecified: Secondary | ICD-10-CM | POA: Diagnosis not present

## 2019-09-26 DIAGNOSIS — R52 Pain, unspecified: Secondary | ICD-10-CM | POA: Diagnosis not present

## 2019-09-26 DIAGNOSIS — D688 Other specified coagulation defects: Secondary | ICD-10-CM | POA: Diagnosis not present

## 2019-09-26 DIAGNOSIS — E039 Hypothyroidism, unspecified: Secondary | ICD-10-CM | POA: Diagnosis not present

## 2019-09-26 DIAGNOSIS — Z992 Dependence on renal dialysis: Secondary | ICD-10-CM | POA: Diagnosis not present

## 2019-09-27 ENCOUNTER — Telehealth: Payer: Self-pay | Admitting: Interventional Cardiology

## 2019-09-27 NOTE — Telephone Encounter (Signed)
I spoke with patient who is requesting daughter attend appointment with her on Monday to go over test results. Patient also uses walker and needs assistance with ambulating. I told her I would make note that her daughter would be attending appointment on 4/12 with her.

## 2019-09-27 NOTE — Telephone Encounter (Signed)
New Message:    Pt have an appt on Monday(09-30-19). She wants her daughter to come in with her, she is getting test results. She need her daughter there to understand what the doctor is saying about her results.

## 2019-09-28 DIAGNOSIS — N2581 Secondary hyperparathyroidism of renal origin: Secondary | ICD-10-CM | POA: Diagnosis not present

## 2019-09-28 DIAGNOSIS — R197 Diarrhea, unspecified: Secondary | ICD-10-CM | POA: Diagnosis not present

## 2019-09-28 DIAGNOSIS — I482 Chronic atrial fibrillation, unspecified: Secondary | ICD-10-CM | POA: Diagnosis not present

## 2019-09-28 DIAGNOSIS — D509 Iron deficiency anemia, unspecified: Secondary | ICD-10-CM | POA: Diagnosis not present

## 2019-09-28 DIAGNOSIS — R519 Headache, unspecified: Secondary | ICD-10-CM | POA: Diagnosis not present

## 2019-09-28 DIAGNOSIS — Z992 Dependence on renal dialysis: Secondary | ICD-10-CM | POA: Diagnosis not present

## 2019-09-28 DIAGNOSIS — E039 Hypothyroidism, unspecified: Secondary | ICD-10-CM | POA: Diagnosis not present

## 2019-09-28 DIAGNOSIS — D688 Other specified coagulation defects: Secondary | ICD-10-CM | POA: Diagnosis not present

## 2019-09-28 DIAGNOSIS — R52 Pain, unspecified: Secondary | ICD-10-CM | POA: Diagnosis not present

## 2019-09-28 DIAGNOSIS — N186 End stage renal disease: Secondary | ICD-10-CM | POA: Diagnosis not present

## 2019-09-28 DIAGNOSIS — Z5181 Encounter for therapeutic drug level monitoring: Secondary | ICD-10-CM | POA: Diagnosis not present

## 2019-09-29 NOTE — Progress Notes (Signed)
Cardiology Office Note   Date:  09/29/2019   ID:  [name], DOB Mar 08, 1946, MRN [id number]  PCP:  Lajean Manes, MD    No chief complaint on file.  Preop eval  Wt Readings from Last 3 Encounters:  05/27/19 184 lb 12.8 oz (83.8 kg)  08/27/18 188 lb 7.9 oz (85.5 kg)  04/20/18 192 lb (87.1 kg)       History of Present Illness: [name] is a 74 y.o. female  with a hx of Dilated cardiomyopathy with EF 20-25% in the setting of sepsis in 8786 complicated by demand ischemia and transient atrial fibrillation. She had an abnormal stress test that prompted cardiac catheterization which demonstrated normal coronary arteries in 2014. Other history includes HL, HTN, diastolic HF, ESRD on hemodialysis, recurrent sepsis status post percutaneous nephrostomy, chronic anemia.  She progressed to [facility] 2017.Admitted 6/2-6/5/17with chest pain. CXR showed moderate L pleural effusion and trace R pleural effusion new from prior. CT without contrast showed left lung base consolidation with layering pleural effusion favored to represent pneumonia, with small but intermediate density pericardial effusion new since November (consider pericarditis), chronic large gastric hiatal hernia, calcified coronary atherosclerosis. Albumin was 2.3. Cardiac enzymes were negative. D-dimer was elevated at 3.25. Patient was treated with antibiotics and underwent left thoracentesis. She was followed by pulmonology. Pleural effusion was transudative. She developed AF with RVR in the hospital and was seen by cardiology. Repeat echo demonstrated normal LV function and possible trivial pericardial effusion. She was loaded with amiodarone for an attempt at rhythm control. She returned to NSR.  Effusion resolved on subsequent echo.  Echo in 9/18: "Normal LV systolic function; mild LVH; grade 2 diastolic dysfunction;  calcified aortic valve with moderate to severe AS and trace AI; moderate  to severe MR;  severe LAE; mild TR with moderate pulmonary hypertension.  2. Left ventricular ejection fraction, by estimation, is 55 to 60%. The  left ventricle has normal function. The left ventricle has no regional  wall motion abnormalities. There is mild left ventricular hypertrophy.  Left ventricular diastolic parameters  are consistent with Grade II diastolic dysfunction (pseudonormalization).  3. Right ventricular systolic function is normal. The right ventricular  size is normal. There is moderately elevated pulmonary artery systolic  pressure.  4. Left atrial size was severely dilated.  5. The mitral valve is normal in structure. Moderate to severe mitral  valve regurgitation. No evidence of mitral stenosis.  6. The aortic valve has an indeterminant number of cusps. Aortic valve  regurgitation is trivial. Moderate to severe aortic valve stenosis.  7. The inferior vena cava is dilated in size with >50% respiratory  variability, suggesting right atrial pressure of 8 mmHg."  Dr. Lucretia Roers be doing her knee surgery.  Her walking is limited by the knee pain.  Denies : Chest pain. Dizziness. Leg edema. Nitroglycerin use. Orthopnea. Palpitations. Paroxysmal nocturnal dyspnea.  Syncope.   Mild DOE with walking.  Past Medical History:  Diagnosis Date  . Cardiomyopathy ([place])    a. h/o LV dysfunction EF 20-25% in 2013 due to sepsis.>> improved to normal   . Chronic diastolic CHF (congestive heart failure) ([place])    10/ 2013 in setting of septic shock  . Complication of anesthesia    use a little anesthesia , per patient MD states she quit breathing (2016); hard to wake up  . ESRD (end stage renal disease) ([place])    dialysis Tues, Thurs, Sat [address], sees dr deterding   .  GERD (gastroesophageal reflux disease)   . Glaucoma    both eyes  . H/O hiatal hernia    a. CT 2017: large gastric hiatal hernia.  Marland Kitchen Headache(784.0)    migraine hx of  . History of blood transfusion 04/13/2015   .  History of echocardiogram    a. Echo 6/17: EF 60-65%, normal wall motion, mild MR, atrial septal lipomatous hypertrophy, PASP 34 mmHg, possible trivial free-flowing pericardial effusion along RV free wall // b. Echo 5/17: Mild LVH, EF 55-60%, normal wall motion, grade 1 diastolic dysfunction, trivial MR, severe LAE, mild RAE, PASP 42 mmHg  . History of nephrostomy 04/11/2015   currently inplace 04/28/2015  removed now  . History of nuclear stress test    a. Myoview 1/14 - Marked ischemia in the basal anterior, mid anterior, apical septal and apical inferior regions, EF 63% >> LHC normal   . Hyperlipidemia   . Hypertension    medication removed from regimen due to low blood pressure   . Iron deficiency    hx  . Nephrolithiasis 2002, 2006   bilateral  . Normal coronary arteries 2014   a. LHC in 1/14: normal coronary arteries  . PAF (paroxysmal atrial fibrillation) ([place])    a. 10/ 2013  in setting of Septic Shock //  b. recurrent during admit for pneumonia, L effusion >> placed on Amiodarone // Coumadin for anticoagulation  . Pneumonia jan 2018, last tme lungs clear now   dx 10-06-2014 per CXR--  on 10-27-2014 pt states finished antibiotic and denies cough or fever  . S/P hemodialysis catheter insertion ([place]) 04/11/2015    right anterior chest , only used once   . Sigmoid diverticulosis   . UTI (urinary tract infection) 05/10/2016    Past Surgical History:  Procedure Laterality Date  . AV FISTULA PLACEMENT Left 06/02/2015   Procedure: BRACHIOCEPHALIC ARTERIOVENOUS (AV) FISTULA CREATION ;  Surgeon: Conrad Elm Creek, MD;  Location: [facility];  Service: Vascular;  Laterality: Left;  . BASCILIC VEIN TRANSPOSITION Left 07/27/2015   Procedure: FIRST STAGE BASILIC VEIN TRANSPOSITION LEFT UPPER ARM;  Surgeon: Conrad Knightsen, MD;  Location: [facility];  Service: Vascular;  Laterality: Left;  . BASCILIC VEIN TRANSPOSITION Left 09/2015   second phase  . BASCILIC VEIN TRANSPOSITION Left 10/12/2015   Procedure: SECOND  STAGE BASILIC VEIN TRANSPOSITION LEFT ARM;  Surgeon: Conrad Phillips, MD;  Location: [facility];  Service: Vascular;  Laterality: Left;  . BREAST BIOPSY Left 08/23/07   benign fibrocystic with duct ectasia  . CARDIAC CATHETERIZATION  07-11-2012  dr Irish Lack   Abnormal stress test/   normal coronary arteries/  LVEDP  53mmHg  . CARDIOVASCULAR STRESS TEST  06-26-2012  dr Irish Lack   marked ischemia in the basal anterior, mid anterior, apical inferior regions/  normal LVF, ef 63%  . CATARACT EXTRACTION W/ INTRAOCULAR LENS  IMPLANT, BILATERAL    . COLONOSCOPY WITH PROPOFOL N/A 10/17/2016   Procedure: COLONOSCOPY WITH PROPOFOL;  Surgeon: Garlan Fair, MD;  Location: [facility];  Service: Endoscopy;  Laterality: N/A;  . CYSTOSCOPY W/ URETERAL STENT PLACEMENT  04/04/2012   Procedure: CYSTOSCOPY WITH RETROGRADE PYELOGRAM/URETERAL STENT PLACEMENT;  Surgeon: Ailene Rud, MD;  Location: [facility];  Service: Urology;  Laterality: Left;  . CYSTOSCOPY W/ URETERAL STENT PLACEMENT Bilateral 05/04/2015   Procedure: CYSTOSCOPY WITH BILATERAL RETROGRADE PYELOGRAM/ WITH INTERPRETATION, EXCHANGE OF RIGHT URETERAL STENT REPLACEMENT AND PLACEMENT LEFT URETERAL STENT PLACEMENT EXAMINATION OF VAGINA;  Surgeon: Carolan Clines, MD;  Location:  [facility];  Service: Urology;  Laterality: Bilateral;  . CYSTOSCOPY WITH STENT PLACEMENT Right 10/28/2014   Procedure: RIGHT URETERAL STENT PLACEMENT;  Surgeon: Irine Seal, MD;  Location: [facility];  Service: Urology;  Laterality: Right;  . CYSTOSCOPY WITH STENT PLACEMENT Right 02/26/2015   Procedure: CYSTOSCOPY RETROGRADE PYELOGRAM WITH STENT PLACEMENT;  Surgeon: Cleon Gustin, MD;  Location: [facility];  Service: Urology;  Laterality: Right;  . CYSTOSCOPY/RETROGRADE/URETEROSCOPY/STONE EXTRACTION WITH BASKET Right 11/21/2014   Procedure: CYSTOSCOPY/RIGHT RETROGRADE PYELOGRAM/RIGHT URETEROSCOPY/BASKET EXTRACTION/RIGHT PYELOSCOPY/LASER OF STONE/RIGHT DOUBLE J STENT;   Surgeon: Carolan Clines, MD;  Location: [facility];  Service: Urology;  Laterality: Right;  . ESOPHAGOGASTRODUODENOSCOPY (EGD) WITH PROPOFOL Left 08/25/2018   Procedure: ESOPHAGOGASTRODUODENOSCOPY (EGD) WITH PROPOFOL;  Surgeon: Ronald Lobo, MD;  Location: [facility];  Service: Endoscopy;  Laterality: Left;  . EXTRACORPOREAL SHOCK WAVE LITHOTRIPSY  05-28-2012  &  10-08-2012  . HOLMIUM LASER APPLICATION Right 06/27/15   Procedure: HOLMIUM LASER APPLICATION;  Surgeon: Carolan Clines, MD;  Location: [facility];  Service: Urology;  Laterality: Right;  . IR GENERIC HISTORICAL  02/01/2016   IR NEPHROSTOMY EXCHANGE RIGHT 02/01/2016 Greggory Keen, MD [facility]  . IR GENERIC HISTORICAL  02/24/2016   IR PATIENT EVAL TECH 0-60 MINS 02/24/2016 Aletta Edouard, MD [facility]  . KNEE ARTHROSCOPY Left 02-14-2003  . LAPAROSCOPIC CHOLECYSTECTOMY  03-23-2005  . TOTAL ABDOMINAL HYSTERECTOMY W/ BILATERAL SALPINGOOPHORECTOMY  1993   secondary to fibroids  . TRANSTHORACIC ECHOCARDIOGRAM  04-09-2012   normal LVF,  ef 60-65%,  mild LAE,  mild TR, trivial MR and PR     Current Outpatient Medications  Medication Sig Dispense Refill  . acetaminophen (TYLENOL) 500 MG tablet Take 1,000 mg by mouth every 6 (six) hours as needed for moderate pain or headache.     Marland Kitchen amiodarone (PACERONE) 200 MG tablet TAKE ONE TABLET BY MOUTH DAILY 90 tablet 3  . bimatoprost (LUMIGAN) 0.01 % SOLN Place 1 drop into both eyes at bedtime.    . calcium acetate (PHOSLO) 667 MG capsule Take 1,334 mg by mouth 3 (three) times daily with meals.     . cephALEXin (KEFLEX) 250 MG capsule Take 250 mg by mouth daily.    Marland Kitchen loperamide (IMODIUM A-D) 2 MG tablet Take 4-6 mg by mouth 4 (four) times daily as needed for diarrhea or loose stools.    . Multiple Vitamins-Minerals (MULTIVITAMIN WITH MINERALS) tablet Take 1 tablet by mouth daily.    . ondansetron (ZOFRAN) 4 MG tablet Take 4 mg by mouth every 8 (eight)  hours as needed for nausea or vomiting.    . saccharomyces boulardii (FLORASTOR) 250 MG capsule Take 1 capsule (250 mg total) by mouth 2 (two) times daily. 60 capsule 0  . sodium chloride (OCEAN) 0.65 % SOLN nasal spray Place 1 spray into both nostrils 3 (three) times daily as needed for congestion.    . timolol (BETIMOL) 0.5 % ophthalmic solution Place 1 drop into both eyes every morning.     . warfarin (COUMADIN) 5 MG tablet Take 1/2 to 1 tablet daily as directed by Coumadin clinic 90 tablet 0   No current facility-administered medications for this visit.    Allergies:   Astemizole, Fluorouracil, Vicodin [hydrocodone-acetaminophen], Chlorhexidine, and Percocet [oxycodone-acetaminophen]    Social History:  The patient  reports that she has never smoked. She has never used smokeless tobacco. She reports that she does not drink alcohol or use drugs.   Family History:  The  patient's family history includes Bladder Cancer in her maternal grandfather; Cancer (age of onset: 53) in her father; Cancer (age of onset: 81) in her mother; Dementia in her mother; Diabetes in her brother; Heart disease in her brother; Heart failure in her paternal grandmother; Hypertension in her brother and mother.    ROS:  Please see the history of present illness.   Otherwise, review of systems are positive for knee pain.   All other systems are reviewed and negative.    PHYSICAL EXAM: VS:  LMP 01/19/1992 (Approximate)  , BMI There is no height or weight on file to calculate BMI. GEN: Well nourished, well developed, in no acute distress  HEENT: normal  Neck: no JVD, carotid bruits, or masses Cardiac: RRR; 3/6 systolic murmur, no rubs, or gallops,; 1+ LE pitting edema bilaterally Respiratory:  clear to auscultation bilaterally, normal work of breathing GI: soft, nontender, nondistended, + BS MS: no deformity or atrophy  Skin: warm and dry, no rash Neuro:  Strength and sensation are intact Psych: euthymic mood,  full affect   EKG:   The ekg ordered in 05/2019 demonstrates NSR, NO ST chnages   Recent Labs: No results found for requested labs within last 8760 hours.   Lipid Panel    Component Value Date/Time   CHOL 146 11/20/2015 1302   TRIG 84 07/04/2015 1014   HDL 43 07/04/2015 1014   CHOLHDL 2.9 07/04/2015 1014   VLDL 17 07/04/2015 1014   LDLCALC 63 07/04/2015 1014     Other studies Reviewed: Additional studies/ records that were reviewed today with results demonstrating: 2021 echo reviewed.   ASSESSMENT AND PLAN:  1. PAF/preop clearance:  No AFib sx of late.  No anginal sx.  No further cardiac testing needed before knee surgery.  Moderate AS, with mean gradient of 34 mm Hg.  Avoid excess IV fluids and hypotension.  I had a long discussion with the patient and her daughter regarding surgical clearance.  I do think the benefits of knee replacement from a cardiac standpoint outweigh the risks.  We do want her to be more mobile and more active.  Her aortic stenosis and mitral regurgitation noted by echo do not appear to be causing any heart failure symptoms.  Avoid volume overload in the perioperative period. 2. HTN: The current medical regimen is effective;  continue present plan and medications.  3. Anticoagulated: Warfarin for stroke prevention.  Will need to come off of warfarin for surgery. 4. ESRD: Dialysis 3 times a week. 5. Prior cardiomyopathy has resolved.  EF is still normal.  Mild dyspnea on exertion.  This is likely related to a combination of factors including some deconditioning.  She does not appear to have any pulmonary edema on exam.   Current medicines are reviewed at length with the patient today.  The patient concerns regarding her medicines were addressed.  The following changes have been made:  No change  Labs/ tests ordered today include:  No orders of the defined types were placed in this encounter.   Recommend 150 minutes/week of aerobic exercise Low fat,  low carb, high fiber diet recommended  Disposition:   FU in Dec as scheduled   Signed, Larae Grooms, MD  09/29/2019 11:04 PM    [facility] [address], [place], [place]  [address] Phone: [phone]; Fax: [phone]

## 2019-09-30 ENCOUNTER — Encounter: Payer: Self-pay | Admitting: Interventional Cardiology

## 2019-09-30 ENCOUNTER — Ambulatory Visit: Payer: Medicare Other | Admitting: Interventional Cardiology

## 2019-09-30 ENCOUNTER — Other Ambulatory Visit: Payer: Self-pay

## 2019-09-30 VITALS — BP 158/68 | HR 81 | Ht 63.0 in | Wt 186.0 lb

## 2019-09-30 DIAGNOSIS — I10 Essential (primary) hypertension: Secondary | ICD-10-CM | POA: Diagnosis not present

## 2019-09-30 DIAGNOSIS — Z01810 Encounter for preprocedural cardiovascular examination: Secondary | ICD-10-CM

## 2019-09-30 DIAGNOSIS — N186 End stage renal disease: Secondary | ICD-10-CM

## 2019-09-30 DIAGNOSIS — I480 Paroxysmal atrial fibrillation: Secondary | ICD-10-CM | POA: Diagnosis not present

## 2019-09-30 DIAGNOSIS — I350 Nonrheumatic aortic (valve) stenosis: Secondary | ICD-10-CM | POA: Diagnosis not present

## 2019-09-30 DIAGNOSIS — Z7901 Long term (current) use of anticoagulants: Secondary | ICD-10-CM

## 2019-09-30 NOTE — Patient Instructions (Signed)
Medication Instructions:  Your physician recommends that you continue on your current medications as directed. Please refer to the Current Medication list given to you today.  *If you need a refill on your cardiac medications before your next appointment, please call your pharmacy*   Lab Work: None ordered  If you have labs (blood work) drawn today and your tests are completely normal, you will receive your results only by: Marland Kitchen MyChart Message (if you have MyChart) OR . A paper copy in the mail If you have any lab test that is abnormal or we need to change your treatment, we will call you to review the results.   Testing/Procedures: None ordered   Follow-Up: At [facility], you and your health needs are our priority.  As part of our continuing mission to provide you with exceptional heart care, we have created designated Provider Care Teams.  These Care Teams include your primary Cardiologist (physician) and Advanced Practice Providers (APPs -  Physician Assistants and Nurse Practitioners) who all work together to provide you with the care you need, when you need it.  We recommend signing up for the patient portal called "MyChart".  Sign up information is provided on this After Visit Summary.  MyChart is used to connect with patients for Virtual Visits (Telemedicine).  Patients are able to view lab/test results, encounter notes, upcoming appointments, etc.  Non-urgent messages can be sent to your provider as well.   To learn more about what you can do with MyChart, go to [email or web address].    Your next appointment:   December 2021  The format for your next appointment:   In Person  Provider:   You may see Larae Grooms, MD or one of the following Advanced Practice Providers on your designated Care Team:    Melina Copa, PA-C  Ermalinda Barrios, PA-C    Other Instructions

## 2019-10-01 DIAGNOSIS — N2581 Secondary hyperparathyroidism of renal origin: Secondary | ICD-10-CM | POA: Diagnosis not present

## 2019-10-01 DIAGNOSIS — D509 Iron deficiency anemia, unspecified: Secondary | ICD-10-CM | POA: Diagnosis not present

## 2019-10-01 DIAGNOSIS — Z992 Dependence on renal dialysis: Secondary | ICD-10-CM | POA: Diagnosis not present

## 2019-10-01 DIAGNOSIS — R52 Pain, unspecified: Secondary | ICD-10-CM | POA: Diagnosis not present

## 2019-10-01 DIAGNOSIS — Z5181 Encounter for therapeutic drug level monitoring: Secondary | ICD-10-CM | POA: Diagnosis not present

## 2019-10-01 DIAGNOSIS — D688 Other specified coagulation defects: Secondary | ICD-10-CM | POA: Diagnosis not present

## 2019-10-01 DIAGNOSIS — R197 Diarrhea, unspecified: Secondary | ICD-10-CM | POA: Diagnosis not present

## 2019-10-01 DIAGNOSIS — N186 End stage renal disease: Secondary | ICD-10-CM | POA: Diagnosis not present

## 2019-10-01 DIAGNOSIS — R519 Headache, unspecified: Secondary | ICD-10-CM | POA: Diagnosis not present

## 2019-10-01 DIAGNOSIS — I482 Chronic atrial fibrillation, unspecified: Secondary | ICD-10-CM | POA: Diagnosis not present

## 2019-10-01 DIAGNOSIS — E039 Hypothyroidism, unspecified: Secondary | ICD-10-CM | POA: Diagnosis not present

## 2019-10-03 DIAGNOSIS — N186 End stage renal disease: Secondary | ICD-10-CM | POA: Diagnosis not present

## 2019-10-03 DIAGNOSIS — E039 Hypothyroidism, unspecified: Secondary | ICD-10-CM | POA: Diagnosis not present

## 2019-10-03 DIAGNOSIS — D509 Iron deficiency anemia, unspecified: Secondary | ICD-10-CM | POA: Diagnosis not present

## 2019-10-03 DIAGNOSIS — Z992 Dependence on renal dialysis: Secondary | ICD-10-CM | POA: Diagnosis not present

## 2019-10-03 DIAGNOSIS — R52 Pain, unspecified: Secondary | ICD-10-CM | POA: Diagnosis not present

## 2019-10-03 DIAGNOSIS — Z5181 Encounter for therapeutic drug level monitoring: Secondary | ICD-10-CM | POA: Diagnosis not present

## 2019-10-03 DIAGNOSIS — D688 Other specified coagulation defects: Secondary | ICD-10-CM | POA: Diagnosis not present

## 2019-10-03 DIAGNOSIS — I482 Chronic atrial fibrillation, unspecified: Secondary | ICD-10-CM | POA: Diagnosis not present

## 2019-10-03 DIAGNOSIS — R519 Headache, unspecified: Secondary | ICD-10-CM | POA: Diagnosis not present

## 2019-10-03 DIAGNOSIS — R197 Diarrhea, unspecified: Secondary | ICD-10-CM | POA: Diagnosis not present

## 2019-10-03 DIAGNOSIS — N2581 Secondary hyperparathyroidism of renal origin: Secondary | ICD-10-CM | POA: Diagnosis not present

## 2019-10-04 ENCOUNTER — Ambulatory Visit: Payer: Medicare Other | Admitting: Interventional Cardiology

## 2019-10-05 DIAGNOSIS — Z5181 Encounter for therapeutic drug level monitoring: Secondary | ICD-10-CM | POA: Diagnosis not present

## 2019-10-05 DIAGNOSIS — Z992 Dependence on renal dialysis: Secondary | ICD-10-CM | POA: Diagnosis not present

## 2019-10-05 DIAGNOSIS — E039 Hypothyroidism, unspecified: Secondary | ICD-10-CM | POA: Diagnosis not present

## 2019-10-05 DIAGNOSIS — D688 Other specified coagulation defects: Secondary | ICD-10-CM | POA: Diagnosis not present

## 2019-10-05 DIAGNOSIS — N186 End stage renal disease: Secondary | ICD-10-CM | POA: Diagnosis not present

## 2019-10-05 DIAGNOSIS — R519 Headache, unspecified: Secondary | ICD-10-CM | POA: Diagnosis not present

## 2019-10-05 DIAGNOSIS — D509 Iron deficiency anemia, unspecified: Secondary | ICD-10-CM | POA: Diagnosis not present

## 2019-10-05 DIAGNOSIS — N2581 Secondary hyperparathyroidism of renal origin: Secondary | ICD-10-CM | POA: Diagnosis not present

## 2019-10-05 DIAGNOSIS — R52 Pain, unspecified: Secondary | ICD-10-CM | POA: Diagnosis not present

## 2019-10-05 DIAGNOSIS — R197 Diarrhea, unspecified: Secondary | ICD-10-CM | POA: Diagnosis not present

## 2019-10-05 DIAGNOSIS — I482 Chronic atrial fibrillation, unspecified: Secondary | ICD-10-CM | POA: Diagnosis not present

## 2019-10-07 ENCOUNTER — Other Ambulatory Visit: Payer: Self-pay

## 2019-10-07 ENCOUNTER — Ambulatory Visit (INDEPENDENT_AMBULATORY_CARE_PROVIDER_SITE_OTHER): Payer: Medicare Other | Admitting: *Deleted

## 2019-10-07 DIAGNOSIS — Z5181 Encounter for therapeutic drug level monitoring: Secondary | ICD-10-CM

## 2019-10-07 DIAGNOSIS — I4891 Unspecified atrial fibrillation: Secondary | ICD-10-CM

## 2019-10-07 DIAGNOSIS — I480 Paroxysmal atrial fibrillation: Secondary | ICD-10-CM | POA: Diagnosis not present

## 2019-10-07 LAB — POCT INR: INR: 1.9 — AB (ref 2.0–3.0)

## 2019-10-07 NOTE — Patient Instructions (Addendum)
Description    Take 1 tablet today and then continue taking 1/2 tablet daily except 1 tablet on Thursdays. Hold warfarin 5 days prior to procedure. Resume Warfarin in the evening of procedure or as directed by doctor (take an extra half tablet with usual dose for 2 days then resume normal dose). Recheck INR 1 week post procedure.  Call with any new medications or if scheduled for any procedures or with any questions [phone]

## 2019-10-08 DIAGNOSIS — I482 Chronic atrial fibrillation, unspecified: Secondary | ICD-10-CM | POA: Diagnosis not present

## 2019-10-08 DIAGNOSIS — R197 Diarrhea, unspecified: Secondary | ICD-10-CM | POA: Diagnosis not present

## 2019-10-08 DIAGNOSIS — R519 Headache, unspecified: Secondary | ICD-10-CM | POA: Diagnosis not present

## 2019-10-08 DIAGNOSIS — E039 Hypothyroidism, unspecified: Secondary | ICD-10-CM | POA: Diagnosis not present

## 2019-10-08 DIAGNOSIS — N2581 Secondary hyperparathyroidism of renal origin: Secondary | ICD-10-CM | POA: Diagnosis not present

## 2019-10-08 DIAGNOSIS — N186 End stage renal disease: Secondary | ICD-10-CM | POA: Diagnosis not present

## 2019-10-08 DIAGNOSIS — Z5181 Encounter for therapeutic drug level monitoring: Secondary | ICD-10-CM | POA: Diagnosis not present

## 2019-10-08 DIAGNOSIS — D509 Iron deficiency anemia, unspecified: Secondary | ICD-10-CM | POA: Diagnosis not present

## 2019-10-08 DIAGNOSIS — D688 Other specified coagulation defects: Secondary | ICD-10-CM | POA: Diagnosis not present

## 2019-10-08 DIAGNOSIS — R52 Pain, unspecified: Secondary | ICD-10-CM | POA: Diagnosis not present

## 2019-10-08 DIAGNOSIS — Z992 Dependence on renal dialysis: Secondary | ICD-10-CM | POA: Diagnosis not present

## 2019-10-10 DIAGNOSIS — D509 Iron deficiency anemia, unspecified: Secondary | ICD-10-CM | POA: Diagnosis not present

## 2019-10-10 DIAGNOSIS — N186 End stage renal disease: Secondary | ICD-10-CM | POA: Diagnosis not present

## 2019-10-10 DIAGNOSIS — I482 Chronic atrial fibrillation, unspecified: Secondary | ICD-10-CM | POA: Diagnosis not present

## 2019-10-10 DIAGNOSIS — E039 Hypothyroidism, unspecified: Secondary | ICD-10-CM | POA: Diagnosis not present

## 2019-10-10 DIAGNOSIS — N2581 Secondary hyperparathyroidism of renal origin: Secondary | ICD-10-CM | POA: Diagnosis not present

## 2019-10-10 DIAGNOSIS — R52 Pain, unspecified: Secondary | ICD-10-CM | POA: Diagnosis not present

## 2019-10-10 DIAGNOSIS — R519 Headache, unspecified: Secondary | ICD-10-CM | POA: Diagnosis not present

## 2019-10-10 DIAGNOSIS — Z992 Dependence on renal dialysis: Secondary | ICD-10-CM | POA: Diagnosis not present

## 2019-10-10 DIAGNOSIS — Z5181 Encounter for therapeutic drug level monitoring: Secondary | ICD-10-CM | POA: Diagnosis not present

## 2019-10-10 DIAGNOSIS — D688 Other specified coagulation defects: Secondary | ICD-10-CM | POA: Diagnosis not present

## 2019-10-10 DIAGNOSIS — R197 Diarrhea, unspecified: Secondary | ICD-10-CM | POA: Diagnosis not present

## 2019-10-12 DIAGNOSIS — E039 Hypothyroidism, unspecified: Secondary | ICD-10-CM | POA: Diagnosis not present

## 2019-10-12 DIAGNOSIS — D509 Iron deficiency anemia, unspecified: Secondary | ICD-10-CM | POA: Diagnosis not present

## 2019-10-12 DIAGNOSIS — R52 Pain, unspecified: Secondary | ICD-10-CM | POA: Diagnosis not present

## 2019-10-12 DIAGNOSIS — R197 Diarrhea, unspecified: Secondary | ICD-10-CM | POA: Diagnosis not present

## 2019-10-12 DIAGNOSIS — N2581 Secondary hyperparathyroidism of renal origin: Secondary | ICD-10-CM | POA: Diagnosis not present

## 2019-10-12 DIAGNOSIS — I482 Chronic atrial fibrillation, unspecified: Secondary | ICD-10-CM | POA: Diagnosis not present

## 2019-10-12 DIAGNOSIS — N186 End stage renal disease: Secondary | ICD-10-CM | POA: Diagnosis not present

## 2019-10-12 DIAGNOSIS — D688 Other specified coagulation defects: Secondary | ICD-10-CM | POA: Diagnosis not present

## 2019-10-12 DIAGNOSIS — Z5181 Encounter for therapeutic drug level monitoring: Secondary | ICD-10-CM | POA: Diagnosis not present

## 2019-10-12 DIAGNOSIS — Z992 Dependence on renal dialysis: Secondary | ICD-10-CM | POA: Diagnosis not present

## 2019-10-12 DIAGNOSIS — R519 Headache, unspecified: Secondary | ICD-10-CM | POA: Diagnosis not present

## 2019-10-15 DIAGNOSIS — D688 Other specified coagulation defects: Secondary | ICD-10-CM | POA: Diagnosis not present

## 2019-10-15 DIAGNOSIS — R52 Pain, unspecified: Secondary | ICD-10-CM | POA: Diagnosis not present

## 2019-10-15 DIAGNOSIS — Z992 Dependence on renal dialysis: Secondary | ICD-10-CM | POA: Diagnosis not present

## 2019-10-15 DIAGNOSIS — R197 Diarrhea, unspecified: Secondary | ICD-10-CM | POA: Diagnosis not present

## 2019-10-15 DIAGNOSIS — D509 Iron deficiency anemia, unspecified: Secondary | ICD-10-CM | POA: Diagnosis not present

## 2019-10-15 DIAGNOSIS — N186 End stage renal disease: Secondary | ICD-10-CM | POA: Diagnosis not present

## 2019-10-15 DIAGNOSIS — E039 Hypothyroidism, unspecified: Secondary | ICD-10-CM | POA: Diagnosis not present

## 2019-10-15 DIAGNOSIS — R519 Headache, unspecified: Secondary | ICD-10-CM | POA: Diagnosis not present

## 2019-10-15 DIAGNOSIS — N2581 Secondary hyperparathyroidism of renal origin: Secondary | ICD-10-CM | POA: Diagnosis not present

## 2019-10-15 DIAGNOSIS — I482 Chronic atrial fibrillation, unspecified: Secondary | ICD-10-CM | POA: Diagnosis not present

## 2019-10-15 DIAGNOSIS — Z5181 Encounter for therapeutic drug level monitoring: Secondary | ICD-10-CM | POA: Diagnosis not present

## 2019-10-16 ENCOUNTER — Other Ambulatory Visit: Payer: Self-pay | Admitting: Interventional Cardiology

## 2019-10-17 DIAGNOSIS — R52 Pain, unspecified: Secondary | ICD-10-CM | POA: Diagnosis not present

## 2019-10-17 DIAGNOSIS — D688 Other specified coagulation defects: Secondary | ICD-10-CM | POA: Diagnosis not present

## 2019-10-17 DIAGNOSIS — R197 Diarrhea, unspecified: Secondary | ICD-10-CM | POA: Diagnosis not present

## 2019-10-17 DIAGNOSIS — Z5181 Encounter for therapeutic drug level monitoring: Secondary | ICD-10-CM | POA: Diagnosis not present

## 2019-10-17 DIAGNOSIS — N186 End stage renal disease: Secondary | ICD-10-CM | POA: Diagnosis not present

## 2019-10-17 DIAGNOSIS — R519 Headache, unspecified: Secondary | ICD-10-CM | POA: Diagnosis not present

## 2019-10-17 DIAGNOSIS — D509 Iron deficiency anemia, unspecified: Secondary | ICD-10-CM | POA: Diagnosis not present

## 2019-10-17 DIAGNOSIS — N2581 Secondary hyperparathyroidism of renal origin: Secondary | ICD-10-CM | POA: Diagnosis not present

## 2019-10-17 DIAGNOSIS — E039 Hypothyroidism, unspecified: Secondary | ICD-10-CM | POA: Diagnosis not present

## 2019-10-17 DIAGNOSIS — Z992 Dependence on renal dialysis: Secondary | ICD-10-CM | POA: Diagnosis not present

## 2019-10-17 DIAGNOSIS — I482 Chronic atrial fibrillation, unspecified: Secondary | ICD-10-CM | POA: Diagnosis not present

## 2019-10-17 NOTE — Progress Notes (Signed)
[address] [place] [address] [place] [facility] [address] [place] [address] Phone: [phone] Fax: [phone]    Your procedure is scheduled on Tuesday, May 4th.  Report to [facility] Main Entrance "A" at 5:30 A.M., and check in at the Admitting office.  Call this number if you have problems the morning of surgery:  [phone]  Call [phone] if you have any questions prior to your surgery date Monday-Friday 8am-4pm   Remember:  Do not eat after midnight the night before your surgery  You may drink clear liquids until 4:30 a.m. the morning of your surgery.   Clear liquids allowed are: Water, Non-Citrus Juices (without pulp), Carbonated Beverages, Clear Tea, Black Coffee Only, and Gatorade   Enhanced Recovery after Surgery for Orthopedics Enhanced Recovery after Surgery is a protocol used to improve the stress on your body and your recovery after surgery.  Patient Instructions  . The night before surgery:  o No food after midnight. ONLY clear liquids after midnight  .  Marland Kitchen The day of surgery (if you do NOT have diabetes):  o Drink ONE (1) Pre-Surgery Clear Ensure by 4:30 A.M. the morning of surgery. Drink in ONE setting, if possible DO NOT SIP. o This drink was given to you during your hospital  pre-op appointment visit. o Nothing else to drink after completing the  Pre-Surgery Clear Ensure.         If you have questions, please contact your surgeon's office.   Take these medicines the morning of surgery with A SIP OF WATER  amiodarone (PACERONE)  If needed - acetaminophen (TYLENOL), ondansetron (ZOFRAN), sodium chloride (OCEAN)/nasal spray, timolol (BETIMOL)/eye drops      Follow your surgeon's instructions on when to stop warfarin (COUMADIN).  If no instructions were given by your surgeon then you will need to call the office to get those instructions.    As of today, STOP taking any Aspirin (unless otherwise instructed by your surgeon) and  Aspirin containing products, Aleve, Naproxen, Ibuprofen, Motrin, Advil, Goody's, BC's, all herbal medications, fish oil, and all vitamins.             Do not wear jewelry, make up, or nail polish            Do not wear lotions, powders, perfumes, or deodorant.            Do not shave 48 hours prior to surgery.              Do not bring valuables to the hospital.            [facility] is not responsible for any belongings or valuables.  Do NOT Smoke (Tobacco/Vapping) or drink Alcohol 24 hours prior to your procedure If you use a CPAP at night, you may bring all equipment for your overnight stay.   Contacts, glasses, dentures or bridgework may not be worn into surgery.      For patients admitted to the hospital, discharge time will be determined by your treatment team.   Patients discharged the day of surgery will not be allowed to drive home, and someone needs to stay with them for 24 hours.  Special instructions:   [facility]- Preparing For Surgery  Before surgery, you can play an important role. Because skin is not sterile, your skin needs to be as free of germs as possible. You can reduce the number of germs on your skin by washing with CHG (chlorahexidine gluconate) Soap  before surgery.  CHG is an antiseptic cleaner which kills germs and bonds with the skin to continue killing germs even after washing.    Oral Hygiene is also important to reduce your risk of infection.  Remember - BRUSH YOUR TEETH THE MORNING OF SURGERY WITH YOUR REGULAR TOOTHPASTE  Please do not use if you have an allergy to CHG or antibacterial soaps. If your skin becomes reddened/irritated stop using the CHG.  Do not shave (including legs and underarms) for at least 48 hours prior to first CHG shower. It is OK to shave your face.  Please follow these instructions carefully.   1. Shower the NIGHT BEFORE SURGERY and the MORNING OF SURGERY with CHG Soap.   2. If you chose to wash your hair, wash your hair first as  usual with your normal shampoo.  3. After you shampoo, rinse your hair and body thoroughly to remove the shampoo.  4. Use CHG as you would any other liquid soap. You can apply CHG directly to the skin and wash gently with a scrungie or a clean washcloth.   5. Apply the CHG Soap to your body ONLY FROM THE NECK DOWN.  Do not use on open wounds or open sores. Avoid contact with your eyes, ears, mouth and genitals (private parts). Wash Face and genitals (private parts)  with your normal soap.   6. Wash thoroughly, paying special attention to the area where your surgery will be performed.  7. Thoroughly rinse your body with warm water from the neck down.  8. DO NOT shower/wash with your normal soap after using and rinsing off the CHG Soap.  9. Pat yourself dry with a CLEAN TOWEL.  10. Wear CLEAN PAJAMAS to bed the night before surgery, wear comfortable clothes the morning of surgery  11. Place CLEAN SHEETS on your bed the night of your first shower and DO NOT SLEEP WITH PETS.  Day of Surgery: Shower with CHG soap as instructed above. Do not apply any deodorants/lotions.  Please wear clean clothes to the hospital/surgery center.   Remember to brush your teeth WITH YOUR REGULAR TOOTHPASTE.   Please read over the following fact sheets that you were given.

## 2019-10-18 ENCOUNTER — Encounter (HOSPITAL_COMMUNITY)
Admission: RE | Admit: 2019-10-18 | Discharge: 2019-10-18 | Disposition: A | Discharge status: Home / Self Care | Payer: Medicare Other | Source: Ambulatory Visit | Attending: Surgery | Admitting: Surgery

## 2019-10-18 ENCOUNTER — Other Ambulatory Visit (HOSPITAL_COMMUNITY)
Admission: RE | Admit: 2019-10-18 | Discharge: 2019-10-18 | Disposition: A | Discharge status: Home / Self Care | Payer: Medicare Other | Source: Ambulatory Visit | Attending: Orthopedic Surgery | Admitting: Orthopedic Surgery

## 2019-10-18 ENCOUNTER — Encounter (HOSPITAL_COMMUNITY): Payer: Self-pay

## 2019-10-18 ENCOUNTER — Other Ambulatory Visit: Payer: Self-pay

## 2019-10-18 DIAGNOSIS — Z01812 Encounter for preprocedural laboratory examination: Secondary | ICD-10-CM | POA: Diagnosis not present

## 2019-10-18 DIAGNOSIS — Z992 Dependence on renal dialysis: Secondary | ICD-10-CM | POA: Diagnosis not present

## 2019-10-18 DIAGNOSIS — Z20822 Contact with and (suspected) exposure to covid-19: Secondary | ICD-10-CM | POA: Insufficient documentation

## 2019-10-18 DIAGNOSIS — N186 End stage renal disease: Secondary | ICD-10-CM | POA: Diagnosis not present

## 2019-10-18 DIAGNOSIS — I129 Hypertensive chronic kidney disease with stage 1 through stage 4 chronic kidney disease, or unspecified chronic kidney disease: Secondary | ICD-10-CM | POA: Diagnosis not present

## 2019-10-18 HISTORY — DX: Personal history of urinary calculi: Z87.442

## 2019-10-18 HISTORY — DX: Shortness of breath: R06.02

## 2019-10-18 HISTORY — DX: Unilateral primary osteoarthritis, left knee: M17.12

## 2019-10-18 LAB — CBC
HCT: 38.5 % (ref 36.0–46.0)
Hemoglobin: 11.8 g/dL — ABNORMAL LOW (ref 12.0–15.0)
MCH: 29.4 pg (ref 26.0–34.0)
MCHC: 30.6 g/dL (ref 30.0–36.0)
MCV: 95.8 fL (ref 80.0–100.0)
Platelets: 208 10*3/uL (ref 150–400)
RBC: 4.02 MIL/uL (ref 3.87–5.11)
RDW: 17.6 % — ABNORMAL HIGH (ref 11.5–15.5)
WBC: 9.7 10*3/uL (ref 4.0–10.5)
nRBC: 0 % (ref 0.0–0.2)

## 2019-10-18 LAB — BASIC METABOLIC PANEL
Anion gap: 14 (ref 5–15)
BUN: 27 mg/dL — ABNORMAL HIGH (ref 8–23)
CO2: 30 mmol/L (ref 22–32)
Calcium: 9.6 mg/dL (ref 8.9–10.3)
Chloride: 94 mmol/L — ABNORMAL LOW (ref 98–111)
Creatinine, Ser: 5.01 mg/dL — ABNORMAL HIGH (ref 0.44–1.00)
GFR calc Af Amer: 9 mL/min — ABNORMAL LOW (ref >60)
GFR calc non Af Amer: 8 mL/min — ABNORMAL LOW (ref >60)
Glucose, Bld: 86 mg/dL (ref 70–99)
Potassium: 3.9 mmol/L (ref 3.5–5.1)
Sodium: 138 mmol/L (ref 135–145)

## 2019-10-18 LAB — PROTIME-INR
INR: 1.6 — ABNORMAL HIGH (ref 0.8–1.2)
Prothrombin Time: 18.4 seconds — ABNORMAL HIGH (ref 11.4–15.2)

## 2019-10-18 LAB — SURGICAL PCR SCREEN
MRSA, PCR: NEGATIVE
Staphylococcus aureus: NEGATIVE

## 2019-10-18 NOTE — Progress Notes (Signed)
PCP - Dr. Lajean Manes Cardiologist - Dr. Larae Grooms Neprhologist - Dr. Jeneen Rinks Deterding  PPM/ICD - denies  Chest x-ray - N/A EKG - 05/26/2020 Stress Test - 2014 ECHO - 3/02-2020  Cardiac Cath - denies  Sleep Study - denies CPAP - N/A  Blood Thinner Instructions: COUMADIN: Last dose per patient 10/16/2019 Aspirin Instructions: N/A  ERAS Protcol - No  COVID TEST- Scheduled for today 10/18/2019 after PAT appointment, Patient verbalized understanding of self-quarantine instructions, appointment time and place.  Anesthesia review: YES, cardiac history, cardiac clearance, HD patient. HD days are Tues, Thurs, Sat. Patient stated since surgery is on Tuesday, it has been arranged to have dialysis on Monday, and stay overnight to have dialysis in the hospital on Wednesday.  Patient denies shortness of breath, fever, cough and chest pain at PAT appointment  All instructions explained to the patient, with a verbal understanding of the material. Patient agrees to go over the instructions while at home for a better understanding. Patient also instructed to self quarantine after being tested for COVID-19. The opportunity to ask questions was provided.

## 2019-10-19 DIAGNOSIS — D631 Anemia in chronic kidney disease: Secondary | ICD-10-CM | POA: Diagnosis not present

## 2019-10-19 DIAGNOSIS — I482 Chronic atrial fibrillation, unspecified: Secondary | ICD-10-CM | POA: Diagnosis not present

## 2019-10-19 DIAGNOSIS — R519 Headache, unspecified: Secondary | ICD-10-CM | POA: Diagnosis not present

## 2019-10-19 DIAGNOSIS — D509 Iron deficiency anemia, unspecified: Secondary | ICD-10-CM | POA: Diagnosis not present

## 2019-10-19 DIAGNOSIS — R197 Diarrhea, unspecified: Secondary | ICD-10-CM | POA: Diagnosis not present

## 2019-10-19 DIAGNOSIS — N390 Urinary tract infection, site not specified: Secondary | ICD-10-CM | POA: Diagnosis not present

## 2019-10-19 DIAGNOSIS — E039 Hypothyroidism, unspecified: Secondary | ICD-10-CM | POA: Diagnosis not present

## 2019-10-19 DIAGNOSIS — Z992 Dependence on renal dialysis: Secondary | ICD-10-CM | POA: Diagnosis not present

## 2019-10-19 DIAGNOSIS — N186 End stage renal disease: Secondary | ICD-10-CM | POA: Diagnosis not present

## 2019-10-19 DIAGNOSIS — D688 Other specified coagulation defects: Secondary | ICD-10-CM | POA: Diagnosis not present

## 2019-10-19 DIAGNOSIS — N2581 Secondary hyperparathyroidism of renal origin: Secondary | ICD-10-CM | POA: Diagnosis not present

## 2019-10-19 DIAGNOSIS — M109 Gout, unspecified: Secondary | ICD-10-CM | POA: Diagnosis not present

## 2019-10-19 DIAGNOSIS — Z5181 Encounter for therapeutic drug level monitoring: Secondary | ICD-10-CM | POA: Diagnosis not present

## 2019-10-19 LAB — SARS CORONAVIRUS 2 (TAT 6-24 HRS): SARS Coronavirus 2: NEGATIVE

## 2019-10-21 DIAGNOSIS — Z992 Dependence on renal dialysis: Secondary | ICD-10-CM | POA: Diagnosis not present

## 2019-10-21 DIAGNOSIS — M109 Gout, unspecified: Secondary | ICD-10-CM | POA: Diagnosis not present

## 2019-10-21 DIAGNOSIS — D509 Iron deficiency anemia, unspecified: Secondary | ICD-10-CM | POA: Diagnosis not present

## 2019-10-21 DIAGNOSIS — N390 Urinary tract infection, site not specified: Secondary | ICD-10-CM | POA: Diagnosis not present

## 2019-10-21 DIAGNOSIS — Z5181 Encounter for therapeutic drug level monitoring: Secondary | ICD-10-CM | POA: Diagnosis not present

## 2019-10-21 DIAGNOSIS — D631 Anemia in chronic kidney disease: Secondary | ICD-10-CM | POA: Diagnosis not present

## 2019-10-21 DIAGNOSIS — E039 Hypothyroidism, unspecified: Secondary | ICD-10-CM | POA: Diagnosis not present

## 2019-10-21 DIAGNOSIS — R197 Diarrhea, unspecified: Secondary | ICD-10-CM | POA: Diagnosis not present

## 2019-10-21 DIAGNOSIS — N2581 Secondary hyperparathyroidism of renal origin: Secondary | ICD-10-CM | POA: Diagnosis not present

## 2019-10-21 DIAGNOSIS — R519 Headache, unspecified: Secondary | ICD-10-CM | POA: Diagnosis not present

## 2019-10-21 DIAGNOSIS — N186 End stage renal disease: Secondary | ICD-10-CM | POA: Diagnosis not present

## 2019-10-21 DIAGNOSIS — D688 Other specified coagulation defects: Secondary | ICD-10-CM | POA: Diagnosis not present

## 2019-10-21 DIAGNOSIS — I482 Chronic atrial fibrillation, unspecified: Secondary | ICD-10-CM | POA: Diagnosis not present

## 2019-10-21 NOTE — Anesthesia Preprocedure Evaluation (Addendum)
Anesthesia Evaluation  Patient identified by MRN, date of birth, ID band Patient awake    Reviewed: Allergy & Precautions, NPO status , Patient's Chart, lab work & pertinent test results  Airway Mallampati: II  TM Distance: >3 FB Neck ROM: Full    Dental  (+) Teeth Intact, Dental Advisory Given   Pulmonary    breath sounds clear to auscultation       Cardiovascular hypertension,  Rhythm:Regular Rate:Normal     Neuro/Psych    GI/Hepatic   Endo/Other    Renal/GU      Musculoskeletal   Abdominal   Peds  Hematology   Anesthesia Other Findings   Reproductive/Obstetrics                            Anesthesia Physical Anesthesia Plan  ASA: III  Anesthesia Plan: General   Post-op Pain Management:  Regional for Post-op pain   Induction:   PONV Risk Score and Plan: Ondansetron and Dexamethasone  Airway Management Planned: Oral ETT  Additional Equipment:   Intra-op Plan:   Post-operative Plan: Extubation in OR  Informed Consent: I have reviewed the patients History and Physical, chart, labs and discussed the procedure including the risks, benefits and alternatives for the proposed anesthesia with the patient or authorized representative who has indicated his/her understanding and acceptance.     Dental advisory given  Plan Discussed with: CRNA and Anesthesiologist  Anesthesia Plan Comments: (Follows with cardiology for history of Dilated CM, Afib, Mod AS (mean grad. 34 mmHg by echo 3/21), HTN. She dilated cardiomyopathy with EF 20-25% in the setting of sepsis in 0626 complicated by demand ischemia and transient atrial fibrillation. She had an abnormal stress test that prompted cardiac catheterization which demonstrated normal coronary arteries in 2014. EF subsequently improved. She had additional episodes of afib and is anticoagulated on coumadin. She was last seen by Dr. Irish Lack 09/30/19  and cleared for surgery. Per note, "Dilated cardiomyopathy with EF 20-25% in the setting of sepsis in 9485 complicated by demand ischemia and transient atrial fibrillation. She had an abnormal stress test that prompted cardiac catheterization which demonstrated normal coronary arteries in 2014. Other history includes HL, HTN, diastolic HF, ESRD on hemodialysis, recurrent sepsis status post percutaneous nephrostomy, chronic anemia."  Hx of ESRD on HD followed by Dr. Jimmy Footman. HD days are Tues, Thurs, Sat. Patient stated since surgery is on Tuesday, it has been arranged to have dialysis on Monday, and stay overnight to have dialysis in the hospital on Wednesday.  Pt reports LD coumadin 4/28. PT/INR still mildly elevated at PAT appt 1.6/18.4 Will repeat DOS.  EKG 05/27/19: Sinus rhythm. Rate 83.  TTE 09/16/19: 1. Normal LV systolic function; mild LVH; grade 2 diastolic dysfunction;  calcified aortic valve with moderate to severe AS and trace AI; moderate  to severe MR; severe LAE; mild TR with moderate pulmonary hypertension.  2. Left ventricular ejection fraction, by estimation, is 55 to 60%. The  left ventricle has normal function. The left ventricle has no regional  wall motion abnormalities. There is mild left ventricular hypertrophy.  Left ventricular diastolic parameters  are consistent with Grade II diastolic dysfunction (pseudonormalization).  3. Right ventricular systolic function is normal. The right ventricular  size is normal. There is moderately elevated pulmonary artery systolic  pressure.  4. Left atrial size was severely dilated.  5. The mitral valve is normal in structure. Moderate to severe mitral  valve regurgitation. No evidence  of mitral stenosis.  6. The aortic valve has an indeterminant number of cusps. Aortic valve  regurgitation is trivial. Moderate to severe aortic valve stenosis.  7. The inferior vena cava is dilated in size with >50% respiratory  variability,  suggesting right atrial pressure of 8 mmHg.  Cath 07/11/12: IMPRESSIONS:  Normal left main coronary artery. Normal left anterior descending artery and its branches. Small left circumflex artery and its branches. Normal right coronary artery. LVEDP 13 mmHg.   RECOMMENDATION:  Continue preventive therapy.  Patient will be aggressively hydrated post procedure with saline and bicarb drips.  Contrast use was minimized due to renal insufficiency.)       Anesthesia Quick Evaluation

## 2019-10-21 NOTE — Progress Notes (Signed)
Anesthesia Chart Review:  Follows with cardiology for history of Dilated CM, Afib, Mod AS (mean grad. 34 mmHg by echo 3/21), HTN. She dilated cardiomyopathy with EF 20-25% in the setting of sepsis in 0762 complicated by demand ischemia and transient atrial fibrillation. She had an abnormal stress test that prompted cardiac catheterization which demonstrated normal coronary arteries in 2014. EF subsequently improved. She had additional episodes of afib and is anticoagulated on coumadin. She was last seen by Dr. Irish Lack 09/30/19 and cleared for surgery. Per note, "Dilated cardiomyopathy with EF 20-25% in the setting of sepsis in 2633 complicated by demand ischemia and transient atrial fibrillation. She had an abnormal stress test that prompted cardiac catheterization which demonstrated normal coronary arteries in 2014. Other history includes HL, HTN, diastolic HF, ESRD on hemodialysis, recurrent sepsis status post percutaneous nephrostomy, chronic anemia."  Hx of ESRD on HD followed by Dr. Jimmy Footman. HD days are Tues, Thurs, Sat. Patient stated since surgery is on Tuesday, it has been arranged to have dialysis on Monday, and stay overnight to have dialysis in the hospital on Wednesday.  Pt reports LD coumadin 4/28. PT/INR still mildly elevated at PAT appt 1.6/18.4 Will repeat DOS.  EKG 05/27/19: Sinus rhythm. Rate 83.  TTE 09/16/19: 1. Normal LV systolic function; mild LVH; grade 2 diastolic dysfunction;  calcified aortic valve with moderate to severe AS and trace AI; moderate  to severe MR; severe LAE; mild TR with moderate pulmonary hypertension.  2. Left ventricular ejection fraction, by estimation, is 55 to 60%. The  left ventricle has normal function. The left ventricle has no regional  wall motion abnormalities. There is mild left ventricular hypertrophy.  Left ventricular diastolic parameters  are consistent with Grade II diastolic dysfunction (pseudonormalization).  3. Right ventricular  systolic function is normal. The right ventricular  size is normal. There is moderately elevated pulmonary artery systolic  pressure.  4. Left atrial size was severely dilated.  5. The mitral valve is normal in structure. Moderate to severe mitral  valve regurgitation. No evidence of mitral stenosis.  6. The aortic valve has an indeterminant number of cusps. Aortic valve  regurgitation is trivial. Moderate to severe aortic valve stenosis.  7. The inferior vena cava is dilated in size with >50% respiratory  variability, suggesting right atrial pressure of 8 mmHg.  Cath 07/11/12: IMPRESSIONS:  1. Normal left main coronary artery. 2. Normal left anterior descending artery and its branches. 3. Small left circumflex artery and its branches. 4. Normal right coronary artery. 5. LVEDP 13 mmHg.   RECOMMENDATION:  Continue preventive therapy.  Patient will be aggressively hydrated post procedure with saline and bicarb drips.  Contrast use was minimized due to renal insufficiency.    [name] [facility] Short Stay Center/Anesthesiology Phone [phone] 10/21/2019 10:01 AM

## 2019-10-22 ENCOUNTER — Inpatient Hospital Stay (HOSPITAL_COMMUNITY): Payer: Medicare Other | Admitting: Physician Assistant

## 2019-10-22 ENCOUNTER — Encounter (HOSPITAL_COMMUNITY): Payer: Self-pay | Admitting: Orthopedic Surgery

## 2019-10-22 ENCOUNTER — Inpatient Hospital Stay (HOSPITAL_COMMUNITY)
Admission: RE | Admit: 2019-10-22 | Discharge: 2019-10-24 | DRG: 469 | Disposition: A | Discharge status: Home Health | Payer: Medicare Other | Attending: Orthopedic Surgery | Admitting: Orthopedic Surgery

## 2019-10-22 ENCOUNTER — Inpatient Hospital Stay (HOSPITAL_COMMUNITY): Payer: Medicare Other

## 2019-10-22 ENCOUNTER — Inpatient Hospital Stay (HOSPITAL_COMMUNITY): Payer: Medicare Other | Admitting: Vascular Surgery

## 2019-10-22 ENCOUNTER — Encounter (HOSPITAL_COMMUNITY)
Admission: RE | Disposition: A | Discharge status: Home Health | Payer: Self-pay | Source: Home / Self Care | Attending: Orthopedic Surgery

## 2019-10-22 ENCOUNTER — Other Ambulatory Visit: Payer: Self-pay

## 2019-10-22 DIAGNOSIS — I219 Acute myocardial infarction, unspecified: Secondary | ICD-10-CM | POA: Diagnosis present

## 2019-10-22 DIAGNOSIS — I5032 Chronic diastolic (congestive) heart failure: Secondary | ICD-10-CM | POA: Diagnosis not present

## 2019-10-22 DIAGNOSIS — Z6832 Body mass index (BMI) 32.0-32.9, adult: Secondary | ICD-10-CM

## 2019-10-22 DIAGNOSIS — E876 Hypokalemia: Secondary | ICD-10-CM | POA: Diagnosis not present

## 2019-10-22 DIAGNOSIS — R197 Diarrhea, unspecified: Secondary | ICD-10-CM | POA: Diagnosis not present

## 2019-10-22 DIAGNOSIS — I480 Paroxysmal atrial fibrillation: Secondary | ICD-10-CM | POA: Diagnosis not present

## 2019-10-22 DIAGNOSIS — Z5181 Encounter for therapeutic drug level monitoring: Secondary | ICD-10-CM | POA: Diagnosis not present

## 2019-10-22 DIAGNOSIS — Z833 Family history of diabetes mellitus: Secondary | ICD-10-CM | POA: Diagnosis not present

## 2019-10-22 DIAGNOSIS — N2581 Secondary hyperparathyroidism of renal origin: Secondary | ICD-10-CM | POA: Diagnosis present

## 2019-10-22 DIAGNOSIS — Z961 Presence of intraocular lens: Secondary | ICD-10-CM | POA: Diagnosis present

## 2019-10-22 DIAGNOSIS — N390 Urinary tract infection, site not specified: Secondary | ICD-10-CM | POA: Diagnosis not present

## 2019-10-22 DIAGNOSIS — Z8052 Family history of malignant neoplasm of bladder: Secondary | ICD-10-CM | POA: Diagnosis not present

## 2019-10-22 DIAGNOSIS — Z955 Presence of coronary angioplasty implant and graft: Secondary | ICD-10-CM | POA: Diagnosis not present

## 2019-10-22 DIAGNOSIS — Z90710 Acquired absence of both cervix and uterus: Secondary | ICD-10-CM

## 2019-10-22 DIAGNOSIS — I2510 Atherosclerotic heart disease of native coronary artery without angina pectoris: Secondary | ICD-10-CM | POA: Diagnosis present

## 2019-10-22 DIAGNOSIS — Z9842 Cataract extraction status, left eye: Secondary | ICD-10-CM | POA: Diagnosis not present

## 2019-10-22 DIAGNOSIS — D509 Iron deficiency anemia, unspecified: Secondary | ICD-10-CM | POA: Diagnosis not present

## 2019-10-22 DIAGNOSIS — Z8249 Family history of ischemic heart disease and other diseases of the circulatory system: Secondary | ICD-10-CM

## 2019-10-22 DIAGNOSIS — Z87442 Personal history of urinary calculi: Secondary | ICD-10-CM | POA: Diagnosis not present

## 2019-10-22 DIAGNOSIS — M1712 Unilateral primary osteoarthritis, left knee: Secondary | ICD-10-CM | POA: Diagnosis not present

## 2019-10-22 DIAGNOSIS — Z96652 Presence of left artificial knee joint: Secondary | ICD-10-CM | POA: Diagnosis not present

## 2019-10-22 DIAGNOSIS — Z885 Allergy status to narcotic agent status: Secondary | ICD-10-CM

## 2019-10-22 DIAGNOSIS — D631 Anemia in chronic kidney disease: Secondary | ICD-10-CM | POA: Diagnosis present

## 2019-10-22 DIAGNOSIS — Z992 Dependence on renal dialysis: Secondary | ICD-10-CM | POA: Diagnosis not present

## 2019-10-22 DIAGNOSIS — Z79899 Other long term (current) drug therapy: Secondary | ICD-10-CM

## 2019-10-22 DIAGNOSIS — I482 Chronic atrial fibrillation, unspecified: Secondary | ICD-10-CM | POA: Diagnosis not present

## 2019-10-22 DIAGNOSIS — Z90722 Acquired absence of ovaries, bilateral: Secondary | ICD-10-CM | POA: Diagnosis not present

## 2019-10-22 DIAGNOSIS — I132 Hypertensive heart and chronic kidney disease with heart failure and with stage 5 chronic kidney disease, or end stage renal disease: Secondary | ICD-10-CM | POA: Diagnosis not present

## 2019-10-22 DIAGNOSIS — Z7901 Long term (current) use of anticoagulants: Secondary | ICD-10-CM | POA: Diagnosis not present

## 2019-10-22 DIAGNOSIS — E039 Hypothyroidism, unspecified: Secondary | ICD-10-CM | POA: Diagnosis not present

## 2019-10-22 DIAGNOSIS — E785 Hyperlipidemia, unspecified: Secondary | ICD-10-CM | POA: Diagnosis not present

## 2019-10-22 DIAGNOSIS — K219 Gastro-esophageal reflux disease without esophagitis: Secondary | ICD-10-CM | POA: Diagnosis present

## 2019-10-22 DIAGNOSIS — Z888 Allergy status to other drugs, medicaments and biological substances status: Secondary | ICD-10-CM

## 2019-10-22 DIAGNOSIS — I252 Old myocardial infarction: Secondary | ICD-10-CM

## 2019-10-22 DIAGNOSIS — H409 Unspecified glaucoma: Secondary | ICD-10-CM | POA: Diagnosis not present

## 2019-10-22 DIAGNOSIS — I429 Cardiomyopathy, unspecified: Secondary | ICD-10-CM | POA: Diagnosis present

## 2019-10-22 DIAGNOSIS — Z471 Aftercare following joint replacement surgery: Secondary | ICD-10-CM | POA: Diagnosis not present

## 2019-10-22 DIAGNOSIS — M109 Gout, unspecified: Secondary | ICD-10-CM | POA: Diagnosis not present

## 2019-10-22 DIAGNOSIS — R519 Headache, unspecified: Secondary | ICD-10-CM | POA: Diagnosis not present

## 2019-10-22 DIAGNOSIS — E669 Obesity, unspecified: Secondary | ICD-10-CM | POA: Diagnosis present

## 2019-10-22 DIAGNOSIS — N186 End stage renal disease: Secondary | ICD-10-CM | POA: Diagnosis present

## 2019-10-22 DIAGNOSIS — R0602 Shortness of breath: Secondary | ICD-10-CM | POA: Diagnosis not present

## 2019-10-22 DIAGNOSIS — M25762 Osteophyte, left knee: Secondary | ICD-10-CM | POA: Diagnosis present

## 2019-10-22 DIAGNOSIS — D688 Other specified coagulation defects: Secondary | ICD-10-CM | POA: Diagnosis not present

## 2019-10-22 DIAGNOSIS — M1611 Unilateral primary osteoarthritis, right hip: Secondary | ICD-10-CM | POA: Diagnosis present

## 2019-10-22 DIAGNOSIS — E8889 Other specified metabolic disorders: Secondary | ICD-10-CM | POA: Diagnosis present

## 2019-10-22 DIAGNOSIS — G8918 Other acute postprocedural pain: Secondary | ICD-10-CM | POA: Diagnosis not present

## 2019-10-22 DIAGNOSIS — Z9841 Cataract extraction status, right eye: Secondary | ICD-10-CM

## 2019-10-22 HISTORY — DX: Unilateral primary osteoarthritis, right hip: M16.11

## 2019-10-22 HISTORY — PX: TOTAL KNEE ARTHROPLASTY: SHX125

## 2019-10-22 LAB — POCT I-STAT, CHEM 8
BUN: 24 mg/dL — ABNORMAL HIGH (ref 8–23)
Calcium, Ion: 1.19 mmol/L (ref 1.15–1.40)
Chloride: 92 mmol/L — ABNORMAL LOW (ref 98–111)
Creatinine, Ser: 4 mg/dL — ABNORMAL HIGH (ref 0.44–1.00)
Glucose, Bld: 86 mg/dL (ref 70–99)
HCT: 34 % — ABNORMAL LOW (ref 36.0–46.0)
Hemoglobin: 11.6 g/dL — ABNORMAL LOW (ref 12.0–15.0)
Potassium: 3.6 mmol/L (ref 3.5–5.1)
Sodium: 138 mmol/L (ref 135–145)
TCO2: 35 mmol/L — ABNORMAL HIGH (ref 22–32)

## 2019-10-22 LAB — PROTIME-INR
INR: 1.2 (ref 0.8–1.2)
Prothrombin Time: 14.6 seconds (ref 11.4–15.2)

## 2019-10-22 SURGERY — ARTHROPLASTY, KNEE, TOTAL
Anesthesia: General | Site: Knee | Laterality: Left

## 2019-10-22 MED ORDER — WARFARIN SODIUM 7.5 MG PO TABS
7.5000 mg | ORAL_TABLET | Freq: Once | ORAL | Status: AC
  Administered 2019-10-22: 7.5 mg via ORAL
  Filled 2019-10-22: qty 1

## 2019-10-22 MED ORDER — ACETAMINOPHEN 325 MG PO TABS
325.0000 mg | ORAL_TABLET | Freq: Four times a day (QID) | ORAL | Status: DC | PRN
  Filled 2019-10-22: qty 2

## 2019-10-22 MED ORDER — TRANEXAMIC ACID-NACL 1000-0.7 MG/100ML-% IV SOLN
INTRAVENOUS | Status: AC
  Filled 2019-10-22: qty 100

## 2019-10-22 MED ORDER — RENA-VITE PO TABS
1.0000 | ORAL_TABLET | Freq: Every day | ORAL | Status: DC
  Administered 2019-10-22 – 2019-10-23 (×2): 1 via ORAL
  Filled 2019-10-22 (×2): qty 1

## 2019-10-22 MED ORDER — FENTANYL CITRATE (PF) 100 MCG/2ML IJ SOLN
INTRAMUSCULAR | Status: DC | PRN
  Administered 2019-10-22: 50 ug via INTRAVENOUS
  Administered 2019-10-22: 100 ug via INTRAVENOUS
  Administered 2019-10-22: 50 ug via INTRAVENOUS

## 2019-10-22 MED ORDER — LATANOPROST 0.005 % OP SOLN
1.0000 [drp] | Freq: Every day | OPHTHALMIC | Status: DC
  Administered 2019-10-22 – 2019-10-23 (×2): 1 [drp] via OPHTHALMIC
  Filled 2019-10-22: qty 2.5

## 2019-10-22 MED ORDER — CALCIUM ACETATE (PHOS BINDER) 667 MG PO CAPS
1334.0000 mg | ORAL_CAPSULE | Freq: Three times a day (TID) | ORAL | Status: DC
  Administered 2019-10-22 – 2019-10-24 (×5): 1334 mg via ORAL
  Filled 2019-10-22 (×7): qty 2

## 2019-10-22 MED ORDER — BISACODYL 10 MG RE SUPP: 10.0000 mg | Freq: Every day | RECTAL | Status: DC | PRN

## 2019-10-22 MED ORDER — AMIODARONE HCL 200 MG PO TABS
200.0000 mg | ORAL_TABLET | Freq: Every day | ORAL | Status: DC
  Administered 2019-10-23 – 2019-10-24 (×2): 200 mg via ORAL
  Filled 2019-10-22 (×4): qty 1

## 2019-10-22 MED ORDER — ACETAMINOPHEN 325 MG PO TABS
ORAL_TABLET | ORAL | Status: AC
  Administered 2019-10-22: 750 mg via ORAL
  Filled 2019-10-22: qty 3

## 2019-10-22 MED ORDER — METOCLOPRAMIDE HCL 5 MG PO TABS: 5.0000 mg | ORAL_TABLET | Freq: Three times a day (TID) | ORAL | Status: DC | PRN

## 2019-10-22 MED ORDER — ONDANSETRON HCL 4 MG/2ML IJ SOLN
4.0000 mg | Freq: Four times a day (QID) | INTRAMUSCULAR | Status: DC | PRN
  Administered 2019-10-22 – 2019-10-23 (×2): 4 mg via INTRAVENOUS
  Filled 2019-10-22 (×2): qty 2

## 2019-10-22 MED ORDER — POVIDONE-IODINE 10 % EX SWAB: 2.0000 "application " | Freq: Once | CUTANEOUS | Status: DC

## 2019-10-22 MED ORDER — PROPOFOL 10 MG/ML IV BOLUS
INTRAVENOUS | Status: AC
  Filled 2019-10-22: qty 40

## 2019-10-22 MED ORDER — ALUM & MAG HYDROXIDE-SIMETH 200-200-20 MG/5ML PO SUSP: 30.0000 mL | ORAL | Status: DC | PRN

## 2019-10-22 MED ORDER — PHENYLEPHRINE HCL-NACL 10-0.9 MG/250ML-% IV SOLN
INTRAVENOUS | Status: DC | PRN
  Administered 2019-10-22: 25 ug/min via INTRAVENOUS

## 2019-10-22 MED ORDER — OXYCODONE HCL 5 MG PO TABS
ORAL_TABLET | ORAL | Status: AC
  Filled 2019-10-22: qty 1

## 2019-10-22 MED ORDER — METHOCARBAMOL 1000 MG/10ML IJ SOLN
500.0000 mg | Freq: Four times a day (QID) | INTRAVENOUS | Status: DC | PRN
  Filled 2019-10-22: qty 5

## 2019-10-22 MED ORDER — BUPIVACAINE HCL (PF) 0.25 % IJ SOLN
INTRAMUSCULAR | Status: AC
  Filled 2019-10-22: qty 30

## 2019-10-22 MED ORDER — CEPHALEXIN 250 MG PO CAPS
250.0000 mg | ORAL_CAPSULE | Freq: Every day | ORAL | Status: DC
  Administered 2019-10-23 – 2019-10-24 (×2): 250 mg via ORAL
  Filled 2019-10-22 (×4): qty 1

## 2019-10-22 MED ORDER — CEFAZOLIN SODIUM-DEXTROSE 2-4 GM/100ML-% IV SOLN
2.0000 g | Freq: Once | INTRAVENOUS | Status: AC
  Administered 2019-10-22: 2 g via INTRAVENOUS
  Filled 2019-10-22: qty 100

## 2019-10-22 MED ORDER — HYDROMORPHONE HCL 1 MG/ML IJ SOLN: 0.5000 mg | INTRAMUSCULAR | Status: DC | PRN

## 2019-10-22 MED ORDER — SODIUM CHLORIDE 0.9 % IV SOLN
INTRAVENOUS | Status: DC | PRN
Start: 2019-10-22 — End: 2019-10-22

## 2019-10-22 MED ORDER — DOCUSATE SODIUM 100 MG PO CAPS
100.0000 mg | ORAL_CAPSULE | Freq: Two times a day (BID) | ORAL | Status: DC
  Administered 2019-10-22 – 2019-10-24 (×4): 100 mg via ORAL
  Filled 2019-10-22 (×5): qty 1

## 2019-10-22 MED ORDER — POLYETHYLENE GLYCOL 3350 17 G PO PACK: 17.0000 g | PACK | Freq: Every day | ORAL | Status: DC | PRN

## 2019-10-22 MED ORDER — FENTANYL CITRATE (PF) 100 MCG/2ML IJ SOLN
INTRAMUSCULAR | Status: AC
  Filled 2019-10-22: qty 2

## 2019-10-22 MED ORDER — OXYCODONE HCL 5 MG PO TABS
5.0000 mg | ORAL_TABLET | ORAL | Status: DC | PRN
  Administered 2019-10-22: 5 mg via ORAL
  Administered 2019-10-22 – 2019-10-23 (×2): 10 mg via ORAL
  Administered 2019-10-23: 5 mg via ORAL
  Filled 2019-10-22 (×3): qty 2

## 2019-10-22 MED ORDER — LIDOCAINE 2% (20 MG/ML) 5 ML SYRINGE
INTRAMUSCULAR | Status: DC | PRN
  Administered 2019-10-22: 30 mg via INTRAVENOUS

## 2019-10-22 MED ORDER — ACETAMINOPHEN 500 MG PO TABS: 750.0000 mg | ORAL_TABLET | Freq: Once | ORAL | Status: AC

## 2019-10-22 MED ORDER — ONDANSETRON HCL 4 MG/2ML IJ SOLN
INTRAMUSCULAR | Status: DC | PRN
  Administered 2019-10-22: 4 mg via INTRAVENOUS

## 2019-10-22 MED ORDER — TIMOLOL MALEATE 0.5 % OP SOLN
1.0000 [drp] | Freq: Every morning | OPHTHALMIC | Status: DC
  Administered 2019-10-22 – 2019-10-24 (×3): 1 [drp] via OPHTHALMIC
  Filled 2019-10-22: qty 5

## 2019-10-22 MED ORDER — ZOLPIDEM TARTRATE 5 MG PO TABS: 5.0000 mg | ORAL_TABLET | Freq: Every evening | ORAL | Status: DC | PRN

## 2019-10-22 MED ORDER — POTASSIUM CHLORIDE IN NACL 20-0.45 MEQ/L-% IV SOLN
INTRAVENOUS | Status: DC
  Filled 2019-10-22: qty 1000

## 2019-10-22 MED ORDER — CEFAZOLIN SODIUM-DEXTROSE 2-4 GM/100ML-% IV SOLN
2.0000 g | INTRAVENOUS | Status: AC
  Administered 2019-10-22: 2 g via INTRAVENOUS

## 2019-10-22 MED ORDER — PROPOFOL 10 MG/ML IV BOLUS
INTRAVENOUS | Status: DC | PRN
  Administered 2019-10-22: 160 mg via INTRAVENOUS

## 2019-10-22 MED ORDER — METOCLOPRAMIDE HCL 5 MG/ML IJ SOLN
5.0000 mg | Freq: Three times a day (TID) | INTRAMUSCULAR | Status: DC | PRN
  Administered 2019-10-22: 10 mg via INTRAVENOUS
  Filled 2019-10-22: qty 2

## 2019-10-22 MED ORDER — ACETAMINOPHEN 500 MG PO TABS
1000.0000 mg | ORAL_TABLET | Freq: Four times a day (QID) | ORAL | Status: AC
  Administered 2019-10-22 – 2019-10-23 (×4): 1000 mg via ORAL
  Filled 2019-10-22 (×4): qty 2

## 2019-10-22 MED ORDER — MAGNESIUM CITRATE PO SOLN: 1.0000 | Freq: Once | ORAL | Status: DC | PRN

## 2019-10-22 MED ORDER — 0.9 % SODIUM CHLORIDE (POUR BTL) OPTIME
TOPICAL | Status: DC | PRN
  Administered 2019-10-22: 1000 mL

## 2019-10-22 MED ORDER — TRANEXAMIC ACID-NACL 1000-0.7 MG/100ML-% IV SOLN
1000.0000 mg | Freq: Once | INTRAVENOUS | Status: AC
  Administered 2019-10-22: 1000 mg via INTRAVENOUS
  Filled 2019-10-22: qty 100

## 2019-10-22 MED ORDER — BUPIVACAINE HCL (PF) 0.25 % IJ SOLN
INTRAMUSCULAR | Status: DC | PRN
  Administered 2019-10-22: 20 mL

## 2019-10-22 MED ORDER — ONDANSETRON HCL 4 MG/2ML IJ SOLN
INTRAMUSCULAR | Status: AC
  Filled 2019-10-22: qty 2

## 2019-10-22 MED ORDER — PHENOL 1.4 % MT LIQD: 1.0000 | OROMUCOSAL | Status: DC | PRN

## 2019-10-22 MED ORDER — KETOROLAC TROMETHAMINE 30 MG/ML IJ SOLN
INTRAMUSCULAR | Status: AC
  Filled 2019-10-22: qty 1

## 2019-10-22 MED ORDER — ROCURONIUM BROMIDE 10 MG/ML (PF) SYRINGE
PREFILLED_SYRINGE | INTRAVENOUS | Status: DC | PRN
  Administered 2019-10-22: 20 mg via INTRAVENOUS

## 2019-10-22 MED ORDER — OXYCODONE HCL 5 MG PO TABS
10.0000 mg | ORAL_TABLET | ORAL | Status: DC | PRN
  Administered 2019-10-22 – 2019-10-23 (×2): 10 mg via ORAL
  Filled 2019-10-22 (×2): qty 2

## 2019-10-22 MED ORDER — MIDAZOLAM HCL 2 MG/2ML IJ SOLN
INTRAMUSCULAR | Status: AC
  Filled 2019-10-22: qty 2

## 2019-10-22 MED ORDER — ACETAMINOPHEN 500 MG PO TABS
ORAL_TABLET | ORAL | Status: AC
  Filled 2019-10-22: qty 2

## 2019-10-22 MED ORDER — ONDANSETRON HCL 4 MG/2ML IJ SOLN
4.0000 mg | Freq: Once | INTRAMUSCULAR | Status: AC | PRN
  Administered 2019-10-22: 4 mg via INTRAVENOUS

## 2019-10-22 MED ORDER — ONDANSETRON HCL 4 MG PO TABS: 4.0000 mg | ORAL_TABLET | Freq: Four times a day (QID) | ORAL | Status: DC | PRN

## 2019-10-22 MED ORDER — DEXAMETHASONE SODIUM PHOSPHATE 10 MG/ML IJ SOLN
10.0000 mg | Freq: Once | INTRAMUSCULAR | Status: AC
  Administered 2019-10-23: 10 mg via INTRAVENOUS
  Filled 2019-10-22: qty 1

## 2019-10-22 MED ORDER — KETOROLAC TROMETHAMINE 15 MG/ML IJ SOLN
7.5000 mg | Freq: Four times a day (QID) | INTRAMUSCULAR | Status: AC
  Administered 2019-10-22 – 2019-10-23 (×4): 7.5 mg via INTRAVENOUS
  Filled 2019-10-22 (×4): qty 1

## 2019-10-22 MED ORDER — SUGAMMADEX SODIUM 200 MG/2ML IV SOLN
INTRAVENOUS | Status: DC | PRN
  Administered 2019-10-22: 200 mg via INTRAVENOUS

## 2019-10-22 MED ORDER — SUCCINYLCHOLINE CHLORIDE 20 MG/ML IJ SOLN
INTRAMUSCULAR | Status: DC | PRN
  Administered 2019-10-22: 100 mg via INTRAVENOUS

## 2019-10-22 MED ORDER — SALINE SPRAY 0.65 % NA SOLN
1.0000 | Freq: Three times a day (TID) | NASAL | Status: DC | PRN
  Filled 2019-10-22: qty 44

## 2019-10-22 MED ORDER — MENTHOL 3 MG MT LOZG: 1.0000 | LOZENGE | OROMUCOSAL | Status: DC | PRN

## 2019-10-22 MED ORDER — CEFAZOLIN SODIUM-DEXTROSE 2-4 GM/100ML-% IV SOLN
INTRAVENOUS | Status: AC
  Filled 2019-10-22: qty 100

## 2019-10-22 MED ORDER — FENTANYL CITRATE (PF) 100 MCG/2ML IJ SOLN
25.0000 ug | INTRAMUSCULAR | Status: DC | PRN
  Administered 2019-10-22 (×2): 25 ug via INTRAVENOUS

## 2019-10-22 MED ORDER — SODIUM CHLORIDE 0.9 % IR SOLN
Status: DC | PRN
  Administered 2019-10-22: 1000 mL

## 2019-10-22 MED ORDER — SACCHAROMYCES BOULARDII 250 MG PO CAPS
250.0000 mg | ORAL_CAPSULE | Freq: Two times a day (BID) | ORAL | Status: DC
  Administered 2019-10-22 – 2019-10-24 (×4): 250 mg via ORAL
  Filled 2019-10-22 (×7): qty 1

## 2019-10-22 MED ORDER — MIDAZOLAM HCL 5 MG/5ML IJ SOLN
INTRAMUSCULAR | Status: DC | PRN
  Administered 2019-10-22: 1 mg via INTRAVENOUS

## 2019-10-22 MED ORDER — METHOCARBAMOL 500 MG PO TABS
500.0000 mg | ORAL_TABLET | Freq: Four times a day (QID) | ORAL | Status: DC | PRN
  Administered 2019-10-22: 500 mg via ORAL
  Filled 2019-10-22: qty 1

## 2019-10-22 MED ORDER — LIDOCAINE-PRILOCAINE 2.5-2.5 % EX CREA
1.0000 "application " | TOPICAL_CREAM | CUTANEOUS | Status: DC
  Filled 2019-10-22: qty 5

## 2019-10-22 MED ORDER — BUPIVACAINE LIPOSOME 1.3 % IJ SUSP
20.0000 mL | Freq: Once | INTRAMUSCULAR | Status: DC
  Filled 2019-10-22: qty 20

## 2019-10-22 MED ORDER — TRANEXAMIC ACID-NACL 1000-0.7 MG/100ML-% IV SOLN
1000.0000 mg | INTRAVENOUS | Status: AC
  Administered 2019-10-22: 1000 mg via INTRAVENOUS

## 2019-10-22 MED ORDER — ACETAMINOPHEN 500 MG PO TABS: 1000.0000 mg | ORAL_TABLET | Freq: Once | ORAL | Status: DC

## 2019-10-22 MED ORDER — WARFARIN - PHARMACIST DOSING INPATIENT: Freq: Every day | Status: DC

## 2019-10-22 MED ORDER — DIPHENHYDRAMINE HCL 12.5 MG/5ML PO ELIX: 12.5000 mg | ORAL_SOLUTION | ORAL | Status: DC | PRN

## 2019-10-22 MED ORDER — FENTANYL CITRATE (PF) 250 MCG/5ML IJ SOLN
INTRAMUSCULAR | Status: AC
  Filled 2019-10-22: qty 5

## 2019-10-22 MED ORDER — LOPERAMIDE HCL 2 MG PO CAPS: 4.0000 mg | ORAL_CAPSULE | Freq: Four times a day (QID) | ORAL | Status: DC | PRN

## 2019-10-22 SURGICAL SUPPLY — 65 items
BANDAGE ESMARK 6X9 LF (GAUZE/BANDAGES/DRESSINGS) ×1 IMPLANT
BIT DRILL QUICK REL 1/8 2PK SL (DRILL) IMPLANT
BLADE SAG 18X100X1.27 (BLADE) ×4 IMPLANT
BNDG CMPR 9X6 STRL LF SNTH (GAUZE/BANDAGES/DRESSINGS) ×1
BNDG CMPR MED 10X6 ELC LF (GAUZE/BANDAGES/DRESSINGS) ×1
BNDG CMPR MED 15X6 ELC VLCR LF (GAUZE/BANDAGES/DRESSINGS) ×1
BNDG ELASTIC 6X10 VLCR STRL LF (GAUZE/BANDAGES/DRESSINGS) ×2 IMPLANT
BNDG ELASTIC 6X15 VLCR STRL LF (GAUZE/BANDAGES/DRESSINGS) ×1 IMPLANT
BNDG ESMARK 6X9 LF (GAUZE/BANDAGES/DRESSINGS) ×2
BOWL SMART MIX CTS (DISPOSABLE) ×3 IMPLANT
BRNG TIB 0D 71/75X10 POST (Knees) ×1 IMPLANT
CEMENT BONE R 1X40 (Cement) ×5 IMPLANT
CLSR STERI-STRIP ANTIMIC 1/2X4 (GAUZE/BANDAGES/DRESSINGS) ×3 IMPLANT
COMP FEM VG IL 70 LT (Knees) ×2 IMPLANT
COMPONENT FEM VG IL 70 LT (Knees) IMPLANT
COMPONENT PATELLAR VGD 7.8X3 (Joint) ×1 IMPLANT
COVER SURGICAL LIGHT HANDLE (MISCELLANEOUS) ×2 IMPLANT
COVER WAND RF STERILE (DRAPES) ×2 IMPLANT
CUFF TOURN SGL QUICK 34 (TOURNIQUET CUFF) ×2
CUFF TRNQT CYL 34X4.125X (TOURNIQUET CUFF) ×1 IMPLANT
DISTAL FEMORAL PEG (Knees) ×2 IMPLANT
DRAPE EXTREMITY T 121X128X90 (DISPOSABLE) ×2 IMPLANT
DRAPE HALF SHEET 40X57 (DRAPES) ×2 IMPLANT
DRAPE U-SHAPE 47X51 STRL (DRAPES) ×2 IMPLANT
DRILL QUICK RELEASE 1/8 INCH (DRILL) ×2
DRSG MEPILEX BORDER 4X8 (GAUZE/BANDAGES/DRESSINGS) ×2 IMPLANT
DURAPREP 26ML APPLICATOR (WOUND CARE) ×2 IMPLANT
ELECT CAUTERY BLADE 6.4 (BLADE) ×2 IMPLANT
ELECT REM PT RETURN 9FT ADLT (ELECTROSURGICAL) ×2
ELECTRODE REM PT RTRN 9FT ADLT (ELECTROSURGICAL) ×1 IMPLANT
GLOVE BIOGEL PI ORTHO PRO SZ8 (GLOVE) ×2
GLOVE ORTHO TXT STRL SZ7.5 (GLOVE) ×2 IMPLANT
GLOVE PI ORTHO PRO STRL SZ8 (GLOVE) ×2 IMPLANT
GLOVE SURG ORTHO 8.0 STRL STRW (GLOVE) ×1 IMPLANT
GOWN STRL REUS W/ TWL XL LVL3 (GOWN DISPOSABLE) ×1 IMPLANT
GOWN STRL REUS W/TWL 2XL LVL3 (GOWN DISPOSABLE) ×1 IMPLANT
GOWN STRL REUS W/TWL XL LVL3 (GOWN DISPOSABLE) ×2
HANDPIECE INTERPULSE COAX TIP (DISPOSABLE) ×2
HOOD PEEL AWAY FLYTE STAYCOOL (MISCELLANEOUS) ×5 IMPLANT
IMMOBILIZER KNEE 22 UNIV (SOFTGOODS) ×2 IMPLANT
INSERT TIBIA BEAR 7/75 10 KNEE (Knees) ×1 IMPLANT
KIT BASIN OR (CUSTOM PROCEDURE TRAY) ×2 IMPLANT
KIT TURNOVER KIT B (KITS) ×2 IMPLANT
MANIFOLD NEPTUNE II (INSTRUMENTS) ×2 IMPLANT
NDL 18GX1X1/2 (RX/OR ONLY) (NEEDLE) ×1 IMPLANT
NEEDLE 18GX1X1/2 (RX/OR ONLY) (NEEDLE) ×2 IMPLANT
NS IRRIG 1000ML POUR BTL (IV SOLUTION) ×2 IMPLANT
PACK TOTAL JOINT (CUSTOM PROCEDURE TRAY) ×2 IMPLANT
PAD ABD 8X10 STRL (GAUZE/BANDAGES/DRESSINGS) ×2 IMPLANT
PAD ARMBOARD 7.5X6 YLW CONV (MISCELLANEOUS) ×4 IMPLANT
PEG FEMORAL DISTAL (Knees) IMPLANT
PLATE KNEE TIBIAL 71MM FIXED (Plate) ×1 IMPLANT
SET HNDPC FAN SPRY TIP SCT (DISPOSABLE) ×1 IMPLANT
STRIP CLOSURE SKIN 1/2X4 (GAUZE/BANDAGES/DRESSINGS) ×1 IMPLANT
SUCTION FRAZIER HANDLE 10FR (MISCELLANEOUS) ×2
SUCTION TUBE FRAZIER 10FR DISP (MISCELLANEOUS) ×1 IMPLANT
SUT VIC AB 0 CT1 27 (SUTURE) ×4
SUT VIC AB 0 CT1 27XBRD ANBCTR (SUTURE) ×1 IMPLANT
SUT VIC AB 2-0 CT1 27 (SUTURE) ×2
SUT VIC AB 2-0 CT1 TAPERPNT 27 (SUTURE) ×1 IMPLANT
SUT VIC AB 3-0 SH 8-18 (SUTURE) ×4 IMPLANT
SYR 30ML LL (SYRINGE) IMPLANT
SYR CONTROL 10ML LL (SYRINGE) ×2 IMPLANT
TOWEL GREEN STERILE (TOWEL DISPOSABLE) ×2 IMPLANT
TRAY CATH 16FR W/PLASTIC CATH (SET/KITS/TRAYS/PACK) IMPLANT

## 2019-10-22 NOTE — Op Note (Signed)
DATE OF SURGERY:  10/22/2019 TIME: 10:51 AM  PATIENT NAME:  [name]   AGE: 74 y.o.    PRE-OPERATIVE DIAGNOSIS:  Left knee primary localized osteoarthritis  POST-OPERATIVE DIAGNOSIS:  Same  PROCEDURE: Left total Knee Arthroplasty  SURGEON:  Johnny Bridge, MD   ASSISTANT:  Merlene Pulling, PA-C, present and scrubbed throughout the case, critical for assistance with exposure, retraction, instrumentation, and closure.   OPERATIVE IMPLANTS: Biomet Vanguard Fixed Bearing Posterior Stabilized Femur size 70, Tibia size 71, Patella size 34 x 7.8 mm 3-peg oval button, with a 10 mm polyethylene insert.   PREOPERATIVE INDICATIONS:  [name] is a 74 y.o. year old female with end stage bone on bone degenerative arthritis of the knee who failed conservative treatment, including injections, antiinflammatories, activity modification, and assistive devices, and had significant impairment of their activities of daily living, and elected for Total Knee Arthroplasty.   The risks, benefits, and alternatives were discussed at length including but not limited to the risks of infection, bleeding, nerve injury, stiffness, blood clots, the need for revision surgery, cardiopulmonary complications, among others, and they were willing to proceed.  OPERATIVE FINDINGS AND UNIQUE ASPECTS OF THE CASE: There was severe erosion and destruction on the tibia with hypoplasia of the lateral femoral condyle.  I cut the femur with the jig at 5 degrees of external rotation, and also cut the tibia but did not even get to the full depth of the posterior lateral defect, but I had over 85% of the tray well supported and it was not rocking.  I filled in the gap on the tibia with some cement.  During the cementing process, the cement cured slightly before I was quite ready for the patella, and I ended up removing the patellar button, because it would not fully seat into the holes, removing the cement as best as possible,  and then cementing the patella and a second batch.  Ultimately had excellent position with a patellar final insert and stable fixation.  She had a fair amount of oozing at the completion of the operation, after I let the tourniquet down, although it was not arterial, and did not appear to have significant vessel injury from what I could tell.  ESTIMATED BLOOD LOSS: 150 mL  OPERATIVE DESCRIPTION:  The patient was brought to the operative room and placed in a supine position.  Anesthesia was administered.  IV antibiotics were given.  The lower extremity was prepped and draped in the usual sterile fashion.  Time out was performed.  The leg was elevated and exsanguinated and the tourniquet was inflated.  Anterior quadriceps tendon splitting approach was performed.  The patella was everted and osteophytes were removed.  The anterior horn of the medial and lateral meniscus was removed.   The distal femur was opened with the drill and the intramedullary distal femoral cutting jig was utilized, set at 5 degrees resecting 10 mm off the distal femur.  Care was taken to protect the collateral ligaments.  Then the extramedullary tibial cutting jig was utilized making the appropriate cut using the anterior tibial crest as a reference building in appropriate posterior slope.  Care was taken during the cut to protect the medial and collateral ligaments.  The proximal tibia was removed along with the posterior horns of the menisci.  The PCL was sacrificed.    The extensor gap was measured and was approximately 46mm.    The distal femoral sizing jig was applied, taking care to  avoid notching.  Then the 4-in-1 cutting jig was applied and the anterior and posterior femur was cut, along with the chamfer cuts.  All posterior osteophytes were removed.  The flexion gap was then measured and was symmetric with the extension gap.  I completed the distal femoral preparation using the appropriate jig to prepare the  box.  The patella was then measured, and cut with the saw.  The thickness before the cut was 21.5 and after the cut was 15.  The proximal tibia sized and prepared accordingly with the reamer and the punch, and then all components were trialed with the 14mm poly insert.  The knee was found to have excellent balance and full motion.    The above named components were then cemented into place and all excess cement was removed.  The real polyethylene implant was placed.  I initially placed the patellar button on, but one of the pegs did not want to completely seat, and I was not confident that I like the position, so I removed the button, the cement was already too hard to use, so I abandoned this and then prepared a second batch of cement and cleaned the patella and the button for preparation.    After the cement had cured I released the tourniquet and confirmed excellent hemostasis with no major posterior vessel injury.    The knee was easily taken through a range of motion and the patella tracked well and the knee irrigated copiously and the parapatellar and subcutaneous tissue closed with vicryl, and monocryl with steri strips for the skin.  The wounds were injected with marcaine, and dressed with sterile gauze and the patient was awakened and returned to the PACU in stable and satisfactory condition.  There were no complications.  Total tourniquet time was 113 minutes.

## 2019-10-22 NOTE — Evaluation (Signed)
Physical Therapy Evaluation Patient Details Name: [name] MRN: [id number] DOB: [birth year] Today's Date: 10/22/2019   History of Present Illness  Pt is a 74 y/o female s/p L TKA. PMH includes cardiomyopathy, glaucoma, ESRD on HD, HTN, MI, and a fib.   Clinical Impression  Pt is s/p surgery above with deficits below. Pt requiring min A for bed mobility this session. Pt with increased nausea this session which limited mobility to EOB. Educated about knee precautions. Feel pt will progress well once feeling better. Will continue to follow acutely to maximize functional mobility independence and safety.     Follow Up Recommendations Follow surgeon's recommendation for DC plan and follow-up therapies;Supervision for mobility/OOB    Equipment Recommendations  Rolling walker with 5" wheels(if she does not have one)    Recommendations for Other Services       Precautions / Restrictions Precautions Precautions: Knee Precaution Booklet Issued: No Precaution Comments: Verbally reviewed knee precautions.  Restrictions Weight Bearing Restrictions: Yes LLE Weight Bearing: Weight bearing as tolerated      Mobility  Bed Mobility Overal bed mobility: Needs Assistance Bed Mobility: Supine to Sit;Sit to Supine     Supine to sit: Min assist Sit to supine: Min assist   General bed mobility comments: Min A for LLE assist. Pt with increased nausea that did not improve at EOB, so returned to supine.   Transfers                    Ambulation/Gait                Stairs            Wheelchair Mobility    Modified Rankin (Stroke Patients Only)       Balance Overall balance assessment: Needs assistance Sitting-balance support: Feet supported;No upper extremity supported Sitting balance-Leahy Scale: Fair                                       Pertinent Vitals/Pain Pain Assessment: Faces Faces Pain Scale: Hurts even more Pain Location: L knee   Pain Descriptors / Indicators: Aching;Grimacing;Guarding Pain Intervention(s): Limited activity within patient's tolerance;Monitored during session;Repositioned    Home Living Family/patient expects to be discharged to:: Private residence Living Arrangements: Children Available Help at Discharge: Family;Available 24 hours/day Type of Home: House Home Access: Stairs to enter Entrance Stairs-Rails: None Entrance Stairs-Number of Steps: 2(porch step, then step to get into house) Home Layout: One level Home Equipment: Walker - 4 wheels;Bedside commode      Prior Function Level of Independence: Independent with assistive device(s)         Comments: Uses rollator at home.      Hand Dominance        Extremity/Trunk Assessment   Upper Extremity Assessment Upper Extremity Assessment: Overall WFL for tasks assessed    Lower Extremity Assessment Lower Extremity Assessment: LLE deficits/detail LLE Deficits / Details: Deficits consistent with post op pain and weakness.     Cervical / Trunk Assessment Cervical / Trunk Assessment: Normal  Communication   Communication: No difficulties  Cognition Arousal/Alertness: Awake/alert Behavior During Therapy: WFL for tasks assessed/performed Overall Cognitive Status: Within Functional Limits for tasks assessed  General Comments      Exercises Total Joint Exercises Ankle Circles/Pumps: AROM;Left;10 reps;Supine   Assessment/Plan    PT Assessment Patient needs continued PT services  PT Problem List Decreased strength;Decreased range of motion;Decreased activity tolerance;Decreased balance;Decreased mobility;Decreased knowledge of use of DME;Decreased knowledge of precautions;Pain       PT Treatment Interventions DME instruction;Gait training;Stair training;Functional mobility training;Therapeutic activities;Therapeutic exercise;Balance training;Patient/family education     PT Goals (Current goals can be found in the Care Plan section)  Acute Rehab PT Goals Patient Stated Goal: to feel better PT Goal Formulation: With patient Time For Goal Achievement: 11/05/19 Potential to Achieve Goals: Good    Frequency 7X/week   Barriers to discharge        Co-evaluation               AM-PAC PT "6 Clicks" Mobility  Outcome Measure Help needed turning from your back to your side while in a flat bed without using bedrails?: None Help needed moving from lying on your back to sitting on the side of a flat bed without using bedrails?: A Little Help needed moving to and from a bed to a chair (including a wheelchair)?: A Little Help needed standing up from a chair using your arms (e.g., wheelchair or bedside chair)?: A Little Help needed to walk in hospital room?: A Little   6 Click Score: 16    End of Session   Activity Tolerance: Treatment limited secondary to medical complications (Comment)(nausea) Patient left: in bed;with call bell/phone within reach Nurse Communication: Mobility status;Other (comment)(pt with increased nausea) PT Visit Diagnosis: Muscle weakness (generalized) (M62.81);Difficulty in walking, not elsewhere classified (R26.2);Pain Pain - Right/Left: Left Pain - part of body: Knee    Time: 1539-1600 PT Time Calculation (min) (ACUTE ONLY): 21 min   Charges:   PT Evaluation $PT Eval Low Complexity: 1 Low          Lou Miner, DPT  Acute Rehabilitation Services  Pager: [phone] Office: [phone]   Rudean Hitt 10/22/2019, 5:08 PM

## 2019-10-22 NOTE — Anesthesia Postprocedure Evaluation (Signed)
Anesthesia Post Note  Patient: [name]  Procedure(s) Performed: TOTAL KNEE ARTHROPLASTY (Left Knee)     Patient location during evaluation: PACU Anesthesia Type: General Level of consciousness: awake and alert Pain management: pain level controlled Vital Signs Assessment: post-procedure vital signs reviewed and stable Respiratory status: spontaneous breathing, nonlabored ventilation, respiratory function stable and patient connected to nasal cannula oxygen Cardiovascular status: blood pressure returned to baseline and stable Postop Assessment: no apparent nausea or vomiting Anesthetic complications: no    Last Vitals:  Vitals:   10/22/19 1155 10/22/19 1227  BP: (!) 163/66 (!) 161/56  Pulse: 72 72  Resp: 19 15  Temp: 36.7 C 36.8 C  SpO2: 95% 96%    Last Pain:  Vitals:   10/22/19 1300  TempSrc:   PainSc: 8                  Fujie Dickison COKER

## 2019-10-22 NOTE — Anesthesia Procedure Notes (Signed)
Anesthesia Regional Block: Adductor canal block   Pre-Anesthetic Checklist: ,, timeout performed, Correct Patient, Correct Site, Correct Laterality, Correct Procedure, Correct Position, site marked, Risks and benefits discussed, pre-op evaluation,  At surgeon's request and post-op pain management  Laterality: Left  Prep: Maximum Sterile Barrier Precautions used, chloraprep       Needles:  Injection technique: Single-shot  Needle Type: Echogenic Stimulator Needle     Needle Length: 9cm  Needle Gauge: 21     Additional Needles:   Procedures:,,,, ultrasound used (permanent image in chart),,,,  Narrative:  Start time: 10/22/2019 7:15 AM End time: 10/22/2019 7:20 AM Injection made incrementally with aspirations every 5 mL.  Performed by: Personally  Anesthesiologist: Roberts Gaudy, MD  Additional Notes: 25 cc 0.5% Bupivacaine with 1:200 epi injected easily

## 2019-10-22 NOTE — Anesthesia Procedure Notes (Addendum)
Procedure Name: Intubation Date/Time: 10/22/2019 8:02 AM Performed by: Eligha Bridegroom, CRNA Pre-anesthesia Checklist: Patient identified, Emergency Drugs available, Suction available, Patient being monitored and Timeout performed Patient Re-evaluated:Patient Re-evaluated prior to induction Oxygen Delivery Method: Circle system utilized Preoxygenation: Pre-oxygenation with 100% oxygen Induction Type: IV induction Ventilation: Mask ventilation without difficulty Laryngoscope Size: Mac and 3 Grade View: Grade I Tube type: Oral Tube size: 7.0 mm Number of attempts: 1 Airway Equipment and Method: Stylet Placement Confirmation: ETT inserted through vocal cords under direct vision,  positive ETCO2 and breath sounds checked- equal and bilateral Secured at: 21 cm Tube secured with: Tape Dental Injury: Teeth and Oropharynx as per pre-operative assessment

## 2019-10-22 NOTE — Transfer of Care (Signed)
Immediate Anesthesia Transfer of Care Note  Patient: [name]  Procedure(s) Performed: TOTAL KNEE ARTHROPLASTY (Left Knee)  Patient Location: PACU  Anesthesia Type:General  Level of Consciousness: awake, alert  and oriented  Airway & Oxygen Therapy: Patient Spontanous Breathing and Patient connected to nasal cannula oxygen  Post-op Assessment: Report given to RN and Post -op Vital signs reviewed and stable  Post vital signs: Reviewed and stable  Last Vitals:  Vitals Value Taken Time  BP 136/59 10/22/19 1110  Temp    Pulse 71 10/22/19 1112  Resp 16 10/22/19 1112  SpO2 95 % 10/22/19 1112  Vitals shown include unvalidated device data.  Last Pain:  Vitals:   10/22/19 4199  TempSrc:   PainSc: 0-No pain         Complications: No apparent anesthesia complications

## 2019-10-22 NOTE — H&P (Signed)
PREOPERATIVE H&P  Chief Complaint: left knee pain  HPI: [name] is a 74 y.o. female who presents for preoperative history and physical with a diagnosis of left knee oa. Symptoms are rated as moderate to severe, and have been worsening.  This is significantly impairing activities of daily living.  She has elected for surgical management.   She has failed injections, activity modification, anti-inflammatories, and assistive devices.  Preoperative X-rays demonstrate end stage degenerative changes with osteophyte formation, loss of joint space, subchondral sclerosis.   Past Medical History:  Diagnosis Date  . Arthritis of left knee   . Cardiomyopathy ([place])    a. h/o LV dysfunction EF 20-25% in 2013 due to sepsis.>> improved to normal   . Chronic diastolic CHF (congestive heart failure) ([place])    10/ 2013 in setting of septic shock  . Complication of anesthesia    use a little anesthesia , per patient MD states she quit breathing (2016); hard to wake up  . ESRD (end stage renal disease) ([place])    dialysis Tues, Thurs, Sat [address], sees dr deterding   . GERD (gastroesophageal reflux disease)   . Glaucoma    both eyes  . H/O hiatal hernia    a. CT 2017: large gastric hiatal hernia.  Marland Kitchen Headache(784.0)    migraine hx of  . History of blood transfusion 04/13/2015   . History of echocardiogram    a. Echo 6/17: EF 60-65%, normal wall motion, mild MR, atrial septal lipomatous hypertrophy, PASP 34 mmHg, possible trivial free-flowing pericardial effusion along RV free wall // b. Echo 5/17: Mild LVH, EF 55-60%, normal wall motion, grade 1 diastolic dysfunction, trivial MR, severe LAE, mild RAE, PASP 42 mmHg  . History of kidney stones    10/18/2019: per patient "has a couple currently one in each kidney"  . History of nephrostomy 04/11/2015   currently inplace 04/28/2015  removed now  . History of nuclear stress test    a. Myoview 1/14 - Marked ischemia in the basal anterior, mid  anterior, apical septal and apical inferior regions, EF 63% >> LHC normal   . Hyperlipidemia   . Hypertension    medication removed from regimen due to low blood pressure   . Iron deficiency    hx  . Myocardial infarction [facility]) 2013   10/18/2019: per patient "in 2013)  . Nephrolithiasis 2002, 2006   bilateral  . Normal coronary arteries 2014   a. LHC in 1/14: normal coronary arteries  . PAF (paroxysmal atrial fibrillation) ([place])    a. 10/ 2013  in setting of Septic Shock //  b. recurrent during admit for pneumonia, L effusion >> placed on Amiodarone // Coumadin for anticoagulation  . Pneumonia jan 2018, last tme lungs clear now   dx 10-06-2014 per CXR--  on 10-27-2014 pt states finished antibiotic and denies cough or fever  . S/P hemodialysis catheter insertion ([place]) 04/11/2015    right anterior chest , only used once   . Sigmoid diverticulosis   . SOB (shortness of breath) on exertion    10/18/2019: per patient "get short of breath with activity sometimes due to heart valve"  . UTI (urinary tract infection) 05/10/2016   Past Surgical History:  Procedure Laterality Date  . AV FISTULA PLACEMENT Left 06/02/2015   Procedure: BRACHIOCEPHALIC ARTERIOVENOUS (AV) FISTULA CREATION ;  Surgeon: Conrad Lake Mary Jane, MD;  Location: [place];  Service: Vascular;  Laterality: Left;  . BASCILIC VEIN TRANSPOSITION Left 07/27/2015   Procedure:  FIRST STAGE BASILIC VEIN TRANSPOSITION LEFT UPPER ARM;  Surgeon: Conrad Odenton, MD;  Location: [place];  Service: Vascular;  Laterality: Left;  . BASCILIC VEIN TRANSPOSITION Left 09/2015   second phase  . BASCILIC VEIN TRANSPOSITION Left 10/12/2015   Procedure: SECOND STAGE BASILIC VEIN TRANSPOSITION LEFT ARM;  Surgeon: Conrad Caledonia, MD;  Location: [place];  Service: Vascular;  Laterality: Left;  . BREAST BIOPSY Left 08/23/07   benign fibrocystic with duct ectasia  . CARDIAC CATHETERIZATION  07-11-2012  dr Irish Lack   Abnormal stress test/   normal coronary arteries/  LVEDP  66mmHg   . CARDIOVASCULAR STRESS TEST  06-26-2012  dr Irish Lack   marked ischemia in the basal anterior, mid anterior, apical inferior regions/  normal LVF, ef 63%  . CATARACT EXTRACTION W/ INTRAOCULAR LENS  IMPLANT, BILATERAL    . COLONOSCOPY WITH PROPOFOL N/A 10/17/2016   Procedure: COLONOSCOPY WITH PROPOFOL;  Surgeon: Garlan Fair, MD;  Location: [facility];  Service: Endoscopy;  Laterality: N/A;  . CYSTOSCOPY W/ URETERAL STENT PLACEMENT  04/04/2012   Procedure: CYSTOSCOPY WITH RETROGRADE PYELOGRAM/URETERAL STENT PLACEMENT;  Surgeon: Ailene Rud, MD;  Location: [place];  Service: Urology;  Laterality: Left;  . CYSTOSCOPY W/ URETERAL STENT PLACEMENT Bilateral 05/04/2015   Procedure: CYSTOSCOPY WITH BILATERAL RETROGRADE PYELOGRAM/ WITH INTERPRETATION, EXCHANGE OF RIGHT URETERAL STENT REPLACEMENT AND PLACEMENT LEFT URETERAL STENT PLACEMENT EXAMINATION OF VAGINA;  Surgeon: Carolan Clines, MD;  Location: [facility];  Service: Urology;  Laterality: Bilateral;  . CYSTOSCOPY WITH STENT PLACEMENT Right 10/28/2014   Procedure: RIGHT URETERAL STENT PLACEMENT;  Surgeon: Irine Seal, MD;  [facility];  Service: Urology;  Laterality: Right;  . CYSTOSCOPY WITH STENT PLACEMENT Right 02/26/2015   Procedure: CYSTOSCOPY RETROGRADE PYELOGRAM WITH STENT PLACEMENT;  Surgeon: Cleon Gustin, MD;  Location: [facility];  Service: Urology;  Laterality: Right;  . CYSTOSCOPY/RETROGRADE/URETEROSCOPY/STONE EXTRACTION WITH BASKET Right 11/21/2014   Procedure: CYSTOSCOPY/RIGHT RETROGRADE PYELOGRAM/RIGHT URETEROSCOPY/BASKET EXTRACTION/RIGHT PYELOSCOPY/LASER OF STONE/RIGHT DOUBLE J STENT;  Surgeon: Carolan Clines, MD;  Location: [place];  Service: Urology;  Laterality: Right;  . ESOPHAGOGASTRODUODENOSCOPY (EGD) WITH PROPOFOL Left 08/25/2018   Procedure: ESOPHAGOGASTRODUODENOSCOPY (EGD) WITH PROPOFOL;  Surgeon: Ronald Lobo, MD;  Location: [place];  Service: Endoscopy;   Laterality: Left;  . EXTRACORPOREAL SHOCK WAVE LITHOTRIPSY  05-28-2012  &  10-08-2012  . HOLMIUM LASER APPLICATION Right 08/26/1827   Procedure: HOLMIUM LASER APPLICATION;  Surgeon: Carolan Clines, MD;  Location: [facility];  Service: Urology;  Laterality: Right;  . IR GENERIC HISTORICAL  02/01/2016   IR NEPHROSTOMY EXCHANGE RIGHT 02/01/2016 Greggory Keen, MD [facility]  . IR GENERIC HISTORICAL  02/24/2016   IR PATIENT EVAL TECH 0-60 MINS 02/24/2016 Aletta Edouard, MD [facility]  . KNEE ARTHROSCOPY Left 02-14-2003  . LAPAROSCOPIC CHOLECYSTECTOMY  03-23-2005  . TOTAL ABDOMINAL HYSTERECTOMY W/ BILATERAL SALPINGOOPHORECTOMY  1993   secondary to fibroids  . TRANSTHORACIC ECHOCARDIOGRAM  04-09-2012   normal LVF,  ef 60-65%,  mild LAE,  mild TR, trivial MR and PR   Social History   Socioeconomic History  . Marital status: Widowed    Spouse name: Not on file  . Number of children: 1  . Years of education: Not on file  . Highest education level: Not on file  Occupational History  . Occupation: retired  . Occupation: Herbalist  Tobacco Use  . Smoking status: Never Smoker  . Smokeless tobacco: Never Used  Substance and Sexual Activity  .  Alcohol use: No    Alcohol/week: 0.0 standard drinks  . Drug use: No  . Sexual activity: Never    Birth control/protection: Post-menopausal, Surgical    Comment: widow husband passed 5/05 with lung cancer  Other Topics Concern  . Not on file  Social History Narrative  . Not on file   Social Determinants of Health   Financial Resource Strain:   . Difficulty of Paying Living Expenses:   Food Insecurity:   . Worried About Charity fundraiser in the Last Year:   . Arboriculturist in the Last Year:   Transportation Needs:   . Film/video editor (Medical):   Marland Kitchen Lack of Transportation (Non-Medical):   Physical Activity:   . Days of Exercise per Week:   . Minutes of Exercise per Session:   Stress:   . Feeling of Stress  :   Social Connections:   . Frequency of Communication with Friends and Family:   . Frequency of Social Gatherings with Friends and Family:   . Attends Religious Services:   . Active Member of Clubs or Organizations:   . Attends Archivist Meetings:   Marland Kitchen Marital Status:    Family History  Problem Relation Age of Onset  . Hypertension Mother   . Cancer Mother 15       breast  . Dementia Mother   . Hypertension Brother   . Diabetes Brother   . Heart disease Brother        before age 96  . Cancer Father 28       pancreatic  . Heart failure Paternal Grandmother   . Bladder Cancer Maternal Grandfather    Allergies  Allergen Reactions  . Astemizole Nausea And Vomiting  . Fluorouracil Rash  . Vicodin [Hydrocodone-Acetaminophen] Nausea And Vomiting  . Chlorhexidine Rash    Sunburn    rash  . Percocet [Oxycodone-Acetaminophen] Nausea And Vomiting   Prior to Admission medications   Medication Sig Start Date End Date Taking? Authorizing Provider  acetaminophen (TYLENOL) 500 MG tablet Take 1,000 mg by mouth every 6 (six) hours as needed for moderate pain or headache.    Yes [provider]  amiodarone (PACERONE) 200 MG tablet TAKE ONE TABLET BY MOUTH DAILY Patient taking differently: Take 200 mg by mouth daily.  05/27/19  Yes Jettie Booze, MD  bimatoprost (LUMIGAN) 0.01 % SOLN Place 1 drop into both eyes at bedtime.   Yes [provider]  calcium acetate (PHOSLO) 667 MG capsule Take 1,334 mg by mouth 3 (three) times daily with meals.    Yes [provider]  cephALEXin (KEFLEX) 250 MG capsule Take 250 mg by mouth daily.   Yes [provider]  lidocaine-prilocaine (EMLA) cream Apply 1 application topically 3 (three) times a week.  09/09/19  Yes [provider]  loperamide (IMODIUM A-D) 2 MG tablet Take 4-6 mg by mouth 4 (four) times daily as needed for diarrhea or loose stools.   Yes [provider]  Methoxy PEG-Epoetin  Beta (MIRCERA IJ) as needed. 07/11/19 07/09/20 Yes [provider]  multivitamin (RENA-VIT) TABS tablet Take 1 tablet by mouth at bedtime. 07/27/19  Yes [provider]  ondansetron (ZOFRAN) 4 MG tablet Take 4 mg by mouth every 8 (eight) hours as needed for nausea or vomiting.   Yes [provider]  saccharomyces boulardii (FLORASTOR) 250 MG capsule Take 1 capsule (250 mg total) by mouth 2 (two) times daily. 07/08/15  Yes  Donne Hazel, MD  sodium chloride (OCEAN) 0.65 % SOLN nasal spray Place 1 spray into both nostrils 3 (three) times daily as needed for congestion.   Yes [provider]  timolol (BETIMOL) 0.5 % ophthalmic solution Place 1 drop into both eyes every morning.    Yes [provider]  warfarin (COUMADIN) 5 MG tablet TAKE 1/2 TO 1 TABLET BY MOUTH DAILY AS DIRECTED BY COUMADIN CLINIC 10/16/19  Yes Jettie Booze, MD     Positive ROS: All other systems have been reviewed and were otherwise negative with the exception of those mentioned in the HPI and as above.  Physical Exam: General: Alert, no acute distress Cardiovascular: No pedal edema Respiratory: No cyanosis, no use of accessory musculature GI: No organomegaly, abdomen is soft and non-tender Skin: No lesions in the area of chief complaint Neurologic: Sensation intact distally Psychiatric: Patient is competent for consent with normal mood and affect Lymphatic: No axillary or cervical lymphadenopathy  MUSCULOSKELETAL: left knee with crepitance and a painful arc.  Assessment: Left knee oa   Plan: Plan for Procedure(s): TOTAL KNEE ARTHROPLASTY  The risks benefits and alternatives were discussed with the patient including but not limited to the risks of nonoperative treatment, versus surgical intervention including infection, bleeding, nerve injury,  blood clots, cardiopulmonary complications, morbidity, mortality, among others, and they were willing to proceed.    Anticipated LOS equal to or greater than 2 midnights due to - Age 81 and older with one or more of the following:  - Obesity  - Expected need for hospital services (PT, OT, Nursing) required for safe  discharge  - Anticipated need for postoperative skilled nursing care or inpatient rehab  - Active co-morbidities: Coronary Artery Disease and ESRD     Johnny Bridge, MD Cell [phone]   10/22/2019 7:43 AM

## 2019-10-22 NOTE — Progress Notes (Signed)
ANTICOAGULATION CONSULT NOTE - Initial Consult  Pharmacy Consult for Coumadin Indication: atrial fibrillation  Allergies  Allergen Reactions  . Astemizole Nausea And Vomiting  . Fluorouracil Rash  . Vicodin [Hydrocodone-Acetaminophen] Nausea And Vomiting  . Chlorhexidine Rash    Sunburn    rash  . Percocet [Oxycodone-Acetaminophen] Nausea And Vomiting    Patient Measurements: Height: 5\' 3"  (267 cm) Weight: 83.7 kg (184 lb 9 oz) IBW/kg (Calculated) : 52.4  Vital Signs: Temp: 98.3 F (36.8 C) (05/04 1227) Temp Source: Oral (05/04 0626) BP: 161/56 (05/04 1227) Pulse Rate: 72 (05/04 1227)  Labs: Recent Labs    10/22/19 0603 10/22/19 0658  HGB  --  11.6*  HCT  --  34.0*  LABPROT 14.6  --   INR 1.2  --   CREATININE  --  4.00*    Estimated Creatinine Clearance: 12.6 mL/min (A) (by C-G formula based on SCr of 4 mg/dL (H)).   Medical History: Past Medical History:  Diagnosis Date  . Arthritis of left knee   . Cardiomyopathy ([place])    a. h/o LV dysfunction EF 20-25% in 2013 due to sepsis.>> improved to normal   . Chronic diastolic CHF (congestive heart failure) ([name])    10/ 2013 in setting of septic shock  . Complication of anesthesia    use a little anesthesia , per patient MD states she quit breathing (2016); hard to wake up  . ESRD (end stage renal disease) ([place])    dialysis Tues, Thurs, Sat [address], sees dr deterding   . GERD (gastroesophageal reflux disease)   . Glaucoma    both eyes  . H/O hiatal hernia    a. CT 2017: large gastric hiatal hernia.  Marland Kitchen Headache(784.0)    migraine hx of  . History of blood transfusion 04/13/2015   . History of echocardiogram    a. Echo 6/17: EF 60-65%, normal wall motion, mild MR, atrial septal lipomatous hypertrophy, PASP 34 mmHg, possible trivial free-flowing pericardial effusion along RV free wall // b. Echo 5/17: Mild LVH, EF 55-60%, normal wall motion, grade 1 diastolic dysfunction, trivial MR, severe LAE, mild RAE,  PASP 42 mmHg  . History of kidney stones    10/18/2019: per patient "has a couple currently one in each kidney"  . History of nephrostomy 04/11/2015   currently inplace 04/28/2015  removed now  . History of nuclear stress test    a. Myoview 1/14 - Marked ischemia in the basal anterior, mid anterior, apical septal and apical inferior regions, EF 63% >> LHC normal   . Hyperlipidemia   . Hypertension    medication removed from regimen due to low blood pressure   . Iron deficiency    hx  . Myocardial infarction [facility]) 2013   10/18/2019: per patient "in 2013)  . Nephrolithiasis 2002, 2006   bilateral  . Normal coronary arteries 2014   a. LHC in 1/14: normal coronary arteries  . PAF (paroxysmal atrial fibrillation) ([place])    a. 10/ 2013  in setting of Septic Shock //  b. recurrent during admit for pneumonia, L effusion >> placed on Amiodarone // Coumadin for anticoagulation  . Pneumonia jan 2018, last tme lungs clear now   dx 10-06-2014 per CXR--  on 10-27-2014 pt states finished antibiotic and denies cough or fever  . Primary localized osteoarthritis of right hip 10/22/2019  . S/P hemodialysis catheter insertion ([place]) 04/11/2015    right anterior chest , only used once   . Sigmoid diverticulosis   .  SOB (shortness of breath) on exertion    10/18/2019: per patient "get short of breath with activity sometimes due to heart valve"  . UTI (urinary tract infection) 05/10/2016    Assessment: CC/HPI: L TKA 5/4  PMH: arthritis, CM, CHF, ESRD, GERD, glaucoma, HH, migraines, HTN, HLD, IDA, CAD, PAF,  Anticoag: Coumadin PTA on afib. INR 1.2. Hgb 11.6. - PTA dosing per patient: 2.5mg  daily except 5mg  on Thursdays.  Goal of Therapy:  INR 2-3 Monitor platelets by anticoagulation protocol: Yes   Plan:  Coumadin 7.5mg  po x 1 tonight Daily INR    Dejan Angert S. Alford Highland, PharmD, BCPS Clinical Staff Pharmacist [email or web address] Alford Highland, [place] 10/22/2019,1:03 PM

## 2019-10-22 NOTE — Plan of Care (Signed)
  Problem: Pain Managment: Goal: General experience of comfort will improve Outcome: Progressing   Problem: Safety: Goal: Ability to remain free from injury will improve Outcome: Progressing   Problem: Skin Integrity: Goal: Risk for impaired skin integrity will decrease Outcome: Progressing   

## 2019-10-23 ENCOUNTER — Encounter (HOSPITAL_COMMUNITY): Payer: Self-pay | Admitting: Orthopedic Surgery

## 2019-10-23 LAB — CBC
HCT: 31.7 % — ABNORMAL LOW (ref 36.0–46.0)
Hemoglobin: 10 g/dL — ABNORMAL LOW (ref 12.0–15.0)
MCH: 29.9 pg (ref 26.0–34.0)
MCHC: 31.5 g/dL (ref 30.0–36.0)
MCV: 94.6 fL (ref 80.0–100.0)
Platelets: 197 10*3/uL (ref 150–400)
RBC: 3.35 MIL/uL — ABNORMAL LOW (ref 3.87–5.11)
RDW: 17.6 % — ABNORMAL HIGH (ref 11.5–15.5)
WBC: 13.4 10*3/uL — ABNORMAL HIGH (ref 4.0–10.5)
nRBC: 0 % (ref 0.0–0.2)

## 2019-10-23 LAB — PROTIME-INR
INR: 1.2 (ref 0.8–1.2)
Prothrombin Time: 14.7 seconds (ref 11.4–15.2)

## 2019-10-23 LAB — BASIC METABOLIC PANEL
Anion gap: 14 (ref 5–15)
BUN: 34 mg/dL — ABNORMAL HIGH (ref 8–23)
CO2: 28 mmol/L (ref 22–32)
Calcium: 9.5 mg/dL (ref 8.9–10.3)
Chloride: 93 mmol/L — ABNORMAL LOW (ref 98–111)
Creatinine, Ser: 5.64 mg/dL — ABNORMAL HIGH (ref 0.44–1.00)
GFR calc Af Amer: 8 mL/min — ABNORMAL LOW (ref >60)
GFR calc non Af Amer: 7 mL/min — ABNORMAL LOW (ref >60)
Glucose, Bld: 125 mg/dL — ABNORMAL HIGH (ref 70–99)
Potassium: 5.1 mmol/L (ref 3.5–5.1)
Sodium: 135 mmol/L (ref 135–145)

## 2019-10-23 MED ORDER — CALCIUM CARBONATE ANTACID 500 MG PO CHEW
2.0000 | CHEWABLE_TABLET | Freq: Once | ORAL | Status: AC
  Administered 2019-10-23: 400 mg via ORAL
  Filled 2019-10-23: qty 2

## 2019-10-23 MED ORDER — SODIUM CHLORIDE 0.45 % IV SOLN: INTRAVENOUS | Status: DC

## 2019-10-23 MED ORDER — CHLORHEXIDINE GLUCONATE CLOTH 2 % EX PADS
6.0000 | MEDICATED_PAD | Freq: Every day | CUTANEOUS | Status: DC
  Administered 2019-10-24: 6 via TOPICAL

## 2019-10-23 MED ORDER — WARFARIN SODIUM 6 MG PO TABS
6.0000 mg | ORAL_TABLET | Freq: Once | ORAL | Status: AC
  Administered 2019-10-23: 6 mg via ORAL
  Filled 2019-10-23: qty 1

## 2019-10-23 NOTE — Consult Note (Addendum)
[facility] Renal Consultation Note  Requesting MD: Marchia Bond, MD  Indication for Consultation:  ESRD  Chief complaint: Left knee pain  HPI:  [name] is a 74 y.o. female with a history including end-stage renal disease on TTS HD tachycardia KCl, chronic diastolic CHF with cardiomyopathy, hypertension, and left knee OA who presented to the hospital for left total knee arthroplasty.  She underwent a left TKA on 5/4.  She last had outpatient dialysis on 5/3 off schedule in anticipation of her surgery being scheduled for 5/4.  Note on chart review she received 4 doses of toradol - order is no longer active.   She does make some urine.  She was a little out of breath working with PT and has been on low dose oxygen here.  Has been on normal saline at 75/hr.     PMHx:   Past Medical History:  Diagnosis Date  . Arthritis of left knee   . Cardiomyopathy ([place])    a. h/o LV dysfunction EF 20-25% in 2013 due to sepsis.>> improved to normal   . Chronic diastolic CHF (congestive heart failure) ([place])    10/ 2013 in setting of septic shock  . Complication of anesthesia    use a little anesthesia , per patient MD states she quit breathing (2016); hard to wake up  . ESRD (end stage renal disease) ([place])    dialysis Tues, Thurs, Sat [address], sees dr deterding   . GERD (gastroesophageal reflux disease)   . Glaucoma    both eyes  . H/O hiatal hernia    a. CT 2017: large gastric hiatal hernia.  Marland Kitchen Headache(784.0)    migraine hx of  . History of blood transfusion 04/13/2015   . History of echocardiogram    a. Echo 6/17: EF 60-65%, normal wall motion, mild MR, atrial septal lipomatous hypertrophy, PASP 34 mmHg, possible trivial free-flowing pericardial effusion along RV free wall // b. Echo 5/17: Mild LVH, EF 55-60%, normal wall motion, grade 1 diastolic dysfunction, trivial MR, severe LAE, mild RAE, PASP 42 mmHg  . History of kidney stones    10/18/2019: per patient "has a  couple currently one in each kidney"  . History of nephrostomy 04/11/2015   currently inplace 04/28/2015  removed now  . History of nuclear stress test    a. Myoview 1/14 - Marked ischemia in the basal anterior, mid anterior, apical septal and apical inferior regions, EF 63% >> LHC normal   . Hyperlipidemia   . Hypertension    medication removed from regimen due to low blood pressure   . Iron deficiency    hx  . Myocardial infarction [facility]) 2013   10/18/2019: per patient "in 2013)  . Nephrolithiasis 2002, 2006   bilateral  . Normal coronary arteries 2014   a. LHC in 1/14: normal coronary arteries  . PAF (paroxysmal atrial fibrillation) ([place])    a. 10/ 2013  in setting of Septic Shock //  b. recurrent during admit for pneumonia, L effusion >> placed on Amiodarone // Coumadin for anticoagulation  . Pneumonia jan 2018, last tme lungs clear now   dx 10-06-2014 per CXR--  on 10-27-2014 pt states finished antibiotic and denies cough or fever  . Primary localized osteoarthritis of right hip 10/22/2019  . S/P hemodialysis catheter insertion ([place]) 04/11/2015    right anterior chest , only used once   . Sigmoid diverticulosis   . SOB (shortness of breath) on exertion    10/18/2019:  per patient "get short of breath with activity sometimes due to heart valve"  . UTI (urinary tract infection) 05/10/2016    Past Surgical History:  Procedure Laterality Date  . AV FISTULA PLACEMENT Left 06/02/2015   Procedure: BRACHIOCEPHALIC ARTERIOVENOUS (AV) FISTULA CREATION ;  Surgeon: Conrad Minnetonka, MD;  Location: [place];  Service: Vascular;  Laterality: Left;  . BASCILIC VEIN TRANSPOSITION Left 07/27/2015   Procedure: FIRST STAGE BASILIC VEIN TRANSPOSITION LEFT UPPER ARM;  Surgeon: Conrad [place], MD;  Location: [place];  Service: Vascular;  Laterality: Left;  . BASCILIC VEIN TRANSPOSITION Left 09/2015   second phase  . BASCILIC VEIN TRANSPOSITION Left 10/12/2015   Procedure: SECOND STAGE BASILIC VEIN TRANSPOSITION LEFT  ARM;  Surgeon: Conrad Otsego, MD;  Location: [place];  Service: Vascular;  Laterality: Left;  . BREAST BIOPSY Left 08/23/07   benign fibrocystic with duct ectasia  . CARDIAC CATHETERIZATION  07-11-2012  dr Irish Lack   Abnormal stress test/   normal coronary arteries/  LVEDP  78mmHg  . CARDIOVASCULAR STRESS TEST  06-26-2012  dr Irish Lack   marked ischemia in the basal anterior, mid anterior, apical inferior regions/  normal LVF, ef 63%  . CATARACT EXTRACTION W/ INTRAOCULAR LENS  IMPLANT, BILATERAL    . COLONOSCOPY WITH PROPOFOL N/A 10/17/2016   Procedure: COLONOSCOPY WITH PROPOFOL;  Surgeon: Garlan Fair, MD;  Location: [facility];  Service: Endoscopy;  Laterality: N/A;  . CYSTOSCOPY W/ URETERAL STENT PLACEMENT  04/04/2012   Procedure: CYSTOSCOPY WITH RETROGRADE PYELOGRAM/URETERAL STENT PLACEMENT;  Surgeon: Ailene Rud, MD;  Location: [place];  Service: Urology;  Laterality: Left;  . CYSTOSCOPY W/ URETERAL STENT PLACEMENT Bilateral 05/04/2015   Procedure: CYSTOSCOPY WITH BILATERAL RETROGRADE PYELOGRAM/ WITH INTERPRETATION, EXCHANGE OF RIGHT URETERAL STENT REPLACEMENT AND PLACEMENT LEFT URETERAL STENT PLACEMENT EXAMINATION OF VAGINA;  Surgeon: Carolan Clines, MD;  Location: [facility];  Service: Urology;  Laterality: Bilateral;  . CYSTOSCOPY WITH STENT PLACEMENT Right 10/28/2014   Procedure: RIGHT URETERAL STENT PLACEMENT;  Surgeon: Irine Seal, MD;  [facility];  Service: Urology;  Laterality: Right;  . CYSTOSCOPY WITH STENT PLACEMENT Right 02/26/2015   Procedure: CYSTOSCOPY RETROGRADE PYELOGRAM WITH STENT PLACEMENT;  Surgeon: Cleon Gustin, MD;  Location: [facility];  Service: Urology;  Laterality: Right;  . CYSTOSCOPY/RETROGRADE/URETEROSCOPY/STONE EXTRACTION WITH BASKET Right 11/21/2014   Procedure: CYSTOSCOPY/RIGHT RETROGRADE PYELOGRAM/RIGHT URETEROSCOPY/BASKET EXTRACTION/RIGHT PYELOSCOPY/LASER OF STONE/RIGHT DOUBLE J STENT;  Surgeon: Carolan Clines, MD;  Location:  [place];  Service: Urology;  Laterality: Right;  . ESOPHAGOGASTRODUODENOSCOPY (EGD) WITH PROPOFOL Left 08/25/2018   Procedure: ESOPHAGOGASTRODUODENOSCOPY (EGD) WITH PROPOFOL;  Surgeon: Ronald Lobo, MD;  Location: [place];  Service: Endoscopy;  Laterality: Left;  . EXTRACORPOREAL SHOCK WAVE LITHOTRIPSY  05-28-2012  &  10-08-2012  . HOLMIUM LASER APPLICATION Right 10/24/4330   Procedure: HOLMIUM LASER APPLICATION;  Surgeon: Carolan Clines, MD;  Location: [facility];  Service: Urology;  Laterality: Right;  . IR GENERIC HISTORICAL  02/01/2016   IR NEPHROSTOMY EXCHANGE RIGHT 02/01/2016 Greggory Keen, MD [facility]  . IR GENERIC HISTORICAL  02/24/2016   IR PATIENT EVAL TECH 0-60 MINS 02/24/2016 Aletta Edouard, MD [facility]  . KNEE ARTHROSCOPY Left 02-14-2003  . LAPAROSCOPIC CHOLECYSTECTOMY  03-23-2005  . TOTAL ABDOMINAL HYSTERECTOMY W/ BILATERAL SALPINGOOPHORECTOMY  1993   secondary to fibroids  . TOTAL KNEE ARTHROPLASTY Left 10/22/2019  . TRANSTHORACIC ECHOCARDIOGRAM  04-09-2012   normal LVF,  ef 60-65%,  mild LAE,  mild TR, trivial  MR and PR    Family Hx:  Family History  Problem Relation Age of Onset  . Hypertension Mother   . Cancer Mother 74       breast  . Dementia Mother   . Hypertension Brother   . Diabetes Brother   . Heart disease Brother        before age 70  . Cancer Father 46       pancreatic  . Heart failure Paternal Grandmother   . Bladder Cancer Maternal Grandfather     Social History:  reports that she has never smoked. She has never used smokeless tobacco. She reports that she does not drink alcohol or use drugs.  Allergies:  Allergies  Allergen Reactions  . Astemizole Nausea And Vomiting  . Fluorouracil Rash  . Vicodin [Hydrocodone-Acetaminophen] Nausea And Vomiting  . Chlorhexidine Rash    Sunburn    rash  . Percocet [Oxycodone-Acetaminophen] Nausea And Vomiting    Medications: Prior to Admission medications    Medication Sig Start Date End Date Taking? Authorizing Provider  acetaminophen (TYLENOL) 500 MG tablet Take 1,000 mg by mouth every 6 (six) hours as needed for moderate pain or headache.    Yes [provider]  amiodarone (PACERONE) 200 MG tablet TAKE ONE TABLET BY MOUTH DAILY Patient taking differently: Take 200 mg by mouth daily.  05/27/19  Yes Jettie Booze, MD  bimatoprost (LUMIGAN) 0.01 % SOLN Place 1 drop into both eyes at bedtime.   Yes [provider]  calcium acetate (PHOSLO) 667 MG capsule Take 1,334 mg by mouth 3 (three) times daily with meals.    Yes [provider]  cephALEXin (KEFLEX) 250 MG capsule Take 250 mg by mouth daily.   Yes [provider]  lidocaine-prilocaine (EMLA) cream Apply 1 application topically 3 (three) times a week.  09/09/19  Yes [provider]  loperamide (IMODIUM A-D) 2 MG tablet Take 4-6 mg by mouth 4 (four) times daily as needed for diarrhea or loose stools.   Yes [provider]  Methoxy PEG-Epoetin Beta (MIRCERA IJ) as needed. 07/11/19 07/09/20 Yes [provider]  multivitamin (RENA-VIT) TABS tablet Take 1 tablet by mouth at bedtime. 07/27/19  Yes [provider]  ondansetron (ZOFRAN) 4 MG tablet Take 4 mg by mouth every 8 (eight) hours as needed for nausea or vomiting.   Yes [provider]  saccharomyces boulardii (FLORASTOR) 250 MG capsule Take 1 capsule (250 mg total) by mouth 2 (two) times daily. 07/08/15  Yes Donne Hazel, MD  sodium chloride (OCEAN) 0.65 % SOLN nasal spray Place 1 spray into both nostrils 3 (three) times daily as needed for congestion.   Yes [provider]  timolol (BETIMOL) 0.5 % ophthalmic solution Place 1 drop into both eyes every morning.    Yes [provider]  warfarin (COUMADIN) 5 MG tablet TAKE 1/2 TO 1 TABLET BY MOUTH DAILY AS DIRECTED BY COUMADIN CLINIC 10/16/19  Yes Jettie Booze, MD    I have reviewed the  patient's current and reported prior to admission medications.  Labs:  BMP Latest Ref Rng & Units 10/23/2019 10/22/2019 10/18/2019  Glucose 70 - 99 mg/dL 125(H) 86 86  BUN 8 - 23 mg/dL 34(H) 24(H) 27(H)  Creatinine 0.44 - 1.00 mg/dL 5.64(H) 4.00(H) 5.01(H)  Sodium 135 - 145 mmol/L 135 138 138  Potassium 3.5 - 5.1 mmol/L 5.1 3.6 3.9  Chloride 98 - 111 mmol/L 93(L) 92(L) 94(L)  CO2 22 -  32 mmol/L 28 - 30  Calcium 8.9 - 10.3 mg/dL 9.5 - 9.6   ROS:  Pertinent items noted in HPI and remainder of comprehensive ROS otherwise negative.  Physical Exam: Vitals:   10/23/19 0428 10/23/19 1257  BP: (!) 153/66 139/61  Pulse: 76 82  Resp: 16 16  Temp: 97.7 F (36.5 C) 98 F (36.7 C)  SpO2: 95% 90%     General: adult female in bed in NAD  HEENT: NCAT Eyes: EOMI sclera anicteric Neck supple trachea midline Heart: S1S2 no rub Lungs: clear to auscultation bilaterally; unlabored at rest Abdomen: soft/NT/ND Extremities: trace edema lower extremities; left knee bandaged and ice pack overlying Skin: no rash on extremities expsoed Neuro: alert and oriented x 3; provides hx and follows commands  Psych normal mood and affect Access LUE AVF bruit and thrill   Outpatient HD orders:  [facility] TTS 4 hours  400 BF/ 800 DF  83 kg EDW 2K/2 Calcium  She is not ordered ESA or activated vit D Last Hb 11.4 on 4/29  Assessment/Plan:  # End-stage renal disease - Normally TTS schedule for HD  We will plan for HD on 5/6 per TTS schedule - Stopped IV fluids - Changed to renal diet  - Please avoid any additional toradol or other NSAIDs in order to protect her residual renal function - Discontinued the mag citrate PRN   # Left knee osteoarthritis - Status post left total knee arthroplasty on 5/4 - Per orthopedics  # Hypertension - acceptable control    # Chronic diastolic CHF - Optimize volume with HD - Stopped fluids  # Anemia of CKD - Hemoglobin at goal  # Metabolic bone disease - not on  activated vit D outpatient. Continue phoslo   Claudia Desanctis 10/23/2019, 1:43 PM

## 2019-10-23 NOTE — Progress Notes (Signed)
Subjective: 1 Day Post-Op s/p Procedure(s): TOTAL KNEE ARTHROPLASTY   Patient is alert, oriented, sitting comfortably in chair. Reports pain is mild. Denies chest pain, SOB, Calf pain, adbominal pain, nausea, or vomiting. No other complaints.  Objective:  PE: VITALS:   Vitals:   10/22/19 1942 10/23/19 0010 10/23/19 0428 10/23/19 1257  BP: (!) 157/75 (!) 143/68 (!) 153/66 139/61  Pulse: 72 73 76 82  Resp: 16 16 16 16   Temp: 98.7 F (37.1 C) (!) 97.4 F (36.3 C) 97.7 F (36.5 C) 98 F (36.7 C)  TempSrc:  Oral Oral Oral  SpO2: 96% 97% 95% 90%  Weight:      Height:        ABD soft Neurovascular intact Sensation intact distally Intact pulses distally Dorsiflexion/Plantar flexion intact Incision: dressing C/D/I  LABS  Results for orders placed or performed during the hospital encounter of 10/22/19 (from the past 24 hour(s))  CBC     Status: Abnormal   Collection Time: 10/23/19  5:15 AM  Result Value Ref Range   WBC 13.4 (H) 4.0 - 10.5 K/uL   RBC 3.35 (L) 3.87 - 5.11 MIL/uL   Hemoglobin 10.0 (L) 12.0 - 15.0 g/dL   HCT 31.7 (L) 36.0 - 46.0 %   MCV 94.6 80.0 - 100.0 fL   MCH 29.9 26.0 - 34.0 pg   MCHC 31.5 30.0 - 36.0 g/dL   RDW 17.6 (H) 11.5 - 15.5 %   Platelets 197 150 - 400 K/uL   nRBC 0.0 0.0 - 0.2 %  Basic metabolic panel     Status: Abnormal   Collection Time: 10/23/19  5:15 AM  Result Value Ref Range   Sodium 135 135 - 145 mmol/L   Potassium 5.1 3.5 - 5.1 mmol/L   Chloride 93 (L) 98 - 111 mmol/L   CO2 28 22 - 32 mmol/L   Glucose, Bld 125 (H) 70 - 99 mg/dL   BUN 34 (H) 8 - 23 mg/dL   Creatinine, Ser 5.64 (H) 0.44 - 1.00 mg/dL   Calcium 9.5 8.9 - 10.3 mg/dL   GFR calc non Af Amer 7 (L) >60 mL/min   GFR calc Af Amer 8 (L) >60 mL/min   Anion gap 14 5 - 15  Protime-INR     Status: None   Collection Time: 10/23/19  5:15 AM  Result Value Ref Range   Prothrombin Time 14.7 11.4 - 15.2 seconds   INR 1.2 0.8 - 1.2    DG Knee Left Port  Result Date:  10/22/2019 CLINICAL DATA:  Status post total knee replacement EXAM: PORTABLE LEFT KNEE - 1-2 VIEW COMPARISON:  None. FINDINGS: The left knee demonstrates a total knee arthroplasty without evidence of hardware failure complication. There is no significant joint effusion. There is no fracture or dislocation. The alignment is anatomic. Post-surgical changes noted in the surrounding soft tissues. IMPRESSION: Left total knee arthroplasty. Electronically Signed   By: Kathreen Devoid   On: 10/22/2019 11:44    Assessment/Plan: Principal Problem:   Primary localized osteoarthritis of left knee Active Problems:   S/P TKR (total knee replacement), left   1 Day Post-Op s/p Procedure(s): TOTAL KNEE ARTHROPLASTY  Weightbearing: WBAT LLE Insicional and dressing care: Reinforce dressings as needed VTE prophylaxis: Home Coumadin Pain control: continue current regimen Follow - up plan: Follow up with Dr. Mardelle Matte in 2 weeks Dispo: Nephrology on board for dialysis today or tomorrow. Likely discharge home tomorrow if continuing to progress with PT and  able to have inpatient HD.   Contact information:   Weekdays 8-5 [address], [place] [phone] A fter hours and holidays please check [email or web address] for group call information for [facility] [place] 10/23/2019, 1:02 PM

## 2019-10-23 NOTE — Discharge Instructions (Addendum)
Hold Coumadin tonight 10/24/19 but resume normal home dose tomorrow (10/25/19)   INSTRUCTIONS AFTER JOINT REPLACEMENT   o Remove items at home which could result in a fall. This includes throw rugs or furniture in walking pathways o ICE to the affected joint every three hours while awake for 30 minutes at a time, for at least the first 3-5 days, and then as needed for pain and swelling.  Continue to use ice for pain and swelling. You may notice swelling that will progress down to the foot and ankle.  This is normal after surgery.  Elevate your leg when you are not up walking on it.   o Continue to use the breathing machine you got in the hospital (incentive spirometer) which will help keep your temperature down.  It is common for your temperature to cycle up and down following surgery, especially at night when you are not up moving around and exerting yourself.  The breathing machine keeps your lungs expanded and your temperature down.   DIET:  As you were doing prior to hospitalization, we recommend a well-balanced diet.  DRESSING / WOUND CARE / SHOWERING  You may change your dressing 3-5 days after surgery.  Then change the dressing every day with sterile gauze.  Please use good hand washing techniques before changing the dressing.  Do not use any lotions or creams on the incision until instructed by your surgeon.  ACTIVITY  o Increase activity slowly as tolerated, but follow the weight bearing instructions below.   o No driving for 6 weeks or until further direction given by your physician.  You cannot drive while taking narcotics.  o No lifting or carrying greater than 10 lbs. until further directed by your surgeon. o Avoid periods of inactivity such as sitting longer than an hour when not asleep. This helps prevent blood clots.  o You may return to work once you are authorized by your doctor.     WEIGHT BEARING   Weight bearing as tolerated with assist device (walker, cane, etc) as  directed, use it as long as suggested by your surgeon or therapist, typically at least 4-6 weeks.   EXERCISES  Results after joint replacement surgery are often greatly improved when you follow the exercise, range of motion and muscle strengthening exercises prescribed by your doctor. Safety measures are also important to protect the joint from further injury. Any time any of these exercises cause you to have increased pain or swelling, decrease what you are doing until you are comfortable again and then slowly increase them. If you have problems or questions, call your caregiver or physical therapist for advice.   Rehabilitation is important following a joint replacement. After just a few days of immobilization, the muscles of the leg can become weakened and shrink (atrophy).  These exercises are designed to build up the tone and strength of the thigh and leg muscles and to improve motion. Often times heat used for twenty to thirty minutes before working out will loosen up your tissues and help with improving the range of motion but do not use heat for the first two weeks following surgery (sometimes heat can increase post-operative swelling).   These exercises can be done on a training (exercise) mat, on the floor, on a table or on a bed. Use whatever works the best and is most comfortable for you.    Use music or television while you are exercising so that the exercises are a pleasant break in your day.  This will make your life better with the exercises acting as a break in your routine that you can look forward to.   Perform all exercises about fifteen times, three times per day or as directed.  You should exercise both the operative leg and the other leg as well.  Exercises include:   . Quad Sets - Tighten up the muscle on the front of the thigh (Quad) and hold for 5-10 seconds.   . Straight Leg Raises - With your knee straight (if you were given a brace, keep it on), lift the leg to 60 degrees,  hold for 3 seconds, and slowly lower the leg.  Perform this exercise against resistance later as your leg gets stronger.  . Leg Slides: Lying on your back, slowly slide your foot toward your buttocks, bending your knee up off the floor (only go as far as is comfortable). Then slowly slide your foot back down until your leg is flat on the floor again.  Glenard Haring Wings: Lying on your back spread your legs to the side as far apart as you can without causing discomfort.  . Hamstring Strength:  Lying on your back, push your heel against the floor with your leg straight by tightening up the muscles of your buttocks.  Repeat, but this time bend your knee to a comfortable angle, and push your heel against the floor.  You may put a pillow under the heel to make it more comfortable if necessary.   A rehabilitation program following joint replacement surgery can speed recovery and prevent re-injury in the future due to weakened muscles. Contact your doctor or a physical therapist for more information on knee rehabilitation.    CONSTIPATION  Constipation is defined medically as fewer than three stools per week and severe constipation as less than one stool per week.  Even if you have a regular bowel pattern at home, your normal regimen is likely to be disrupted due to multiple reasons following surgery.  Combination of anesthesia, postoperative narcotics, change in appetite and fluid intake all can affect your bowels.   YOU MUST use at least one of the following options; they are listed in order of increasing strength to get the job done.  They are all available over the counter, and you may need to use some, POSSIBLY even all of these options:    Drink plenty of fluids (prune juice may be helpful) and high fiber foods Colace 100 mg by mouth twice a day  Senokot for constipation as directed and as needed Dulcolax (bisacodyl), take with full glass of water  Miralax (polyethylene glycol) once or twice a day as  needed.  If you have tried all these things and are unable to have a bowel movement in the first 3-4 days after surgery call either your surgeon or your primary doctor.    If you experience loose stools or diarrhea, hold the medications until you stool forms back up.  If your symptoms do not get better within 1 week or if they get worse, check with your doctor.  If you experience "the worst abdominal pain ever" or develop nausea or vomiting, please contact the office immediately for further recommendations for treatment.   ITCHING:  If you experience itching with your medications, try taking only a single pain pill, or even half a pain pill at a time.  You can also use Benadryl over the counter for itching or also to help with sleep.   TED HOSE STOCKINGS:  Use stockings on both legs until for at least 2 weeks or as directed by physician office. They may be removed at night for sleeping.  MEDICATIONS:  See your medication summary on the "After Visit Summary" that nursing will review with you.  You may have some home medications which will be placed on hold until you complete the course of blood thinner medication.  It is important for you to complete the blood thinner medication as prescribed.  PRECAUTIONS:  If you experience chest pain or shortness of breath - call 911 immediately for transfer to the hospital emergency department.   If you develop a fever greater that 101 F, purulent drainage from wound, increased redness or drainage from wound, foul odor from the wound/dressing, or calf pain - CONTACT YOUR SURGEON.                                                   FOLLOW-UP APPOINTMENTS:  If you do not already have a post-op appointment, please call the office for an appointment to be seen by your surgeon.  Guidelines for how soon to be seen are listed in your "After Visit Summary", but are typically between 1-4 weeks after surgery.  OTHER INSTRUCTIONS:   Knee Replacement:  Do not place pillow  under knee, focus on keeping the knee straight while resting. CPM instructions: 0-90 degrees, 2 hours in the morning, 2 hours in the afternoon, and 2 hours in the evening. Place foam block, curve side up under heel at all times except when in CPM or when walking.  DO NOT modify, tear, cut, or change the foam block in any way.   DENTAL ANTIBIOTICS:  In most cases prophylactic antibiotics for Dental procdeures after total joint surgery are not necessary.  Exceptions are as follows:  1. History of prior total joint infection  2. Severely immunocompromised (Organ Transplant, cancer chemotherapy, Rheumatoid biologic meds such as [place])  3. Poorly controlled diabetes (A1C &gt; 8.0, blood glucose over 200)  If you have one of these conditions, contact your surgeon for an antibiotic prescription, prior to your dental procedure.   MAKE SURE YOU:  . Understand these instructions.  . Get help right away if you are not doing well or get worse.    Thank you for letting us be a part of your medical care team.  It is a privilege we respect greatly.  We hope these instructions will help you stay on track for a fast and full recovery!    Information on my medicine - Coumadin   (Warfarin)  Why was Coumadin prescribed for you? Coumadin was prescribed for you because you have a blood clot or a medical condition that can cause an increased risk of forming blood clots. Blood clots can cause serious health problems by blocking the flow of blood to the heart, lung, or brain. Coumadin can prevent harmful blood clots from forming. As a reminder your indication for Coumadin is:   Stroke Prevention Because Of Atrial Fibrillation  What test will check on my response to Coumadin? While on Coumadin (warfarin) you will need to have an INR test regularly to ensure that your dose is keeping you in the desired range. The INR (international normalized ratio) number is calculated from the result of the laboratory  test called prothrombin time (PT).  If an INR APPOINTMENT  HAS NOT ALREADY BEEN MADE FOR YOU please schedule an appointment to have this lab work done by your health care provider within 7 days. Your INR goal is usually a number between:  2 to 3 or your provider may give you a more narrow range like 2-2.5.  Ask your health care provider during an office visit what your goal INR is.  What  do you need to  know  About  COUMADIN? Take Coumadin (warfarin) exactly as prescribed by your healthcare provider about the same time each day.  DO NOT stop taking without talking to the doctor who prescribed the medication.  Stopping without other blood clot prevention medication to take the place of Coumadin may increase your risk of developing a new clot or stroke.  Get refills before you run out.  What do you do if you miss a dose? If you miss a dose, take it as soon as you remember on the same day then continue your regularly scheduled regimen the next day.  Do not take two doses of Coumadin at the same time.  Important Safety Information A possible side effect of Coumadin (Warfarin) is an increased risk of bleeding. You should call your healthcare provider right away if you experience any of the following: ? Bleeding from an injury or your nose that does not stop. ? Unusual colored urine (red or dark Braelynn Lupton) or unusual colored stools (red or black). ? Unusual bruising for unknown reasons. ? A serious fall or if you hit your head (even if there is no bleeding).  Some foods or medicines interact with Coumadin (warfarin) and might alter your response to warfarin. To help avoid this: ? Eat a balanced diet, maintaining a consistent amount of Vitamin K. ? Notify your provider about major diet changes you plan to make. ? Avoid alcohol or limit your intake to 1 drink for women and 2 drinks for men per day. (1 drink is 5 oz. wine, 12 oz. beer, or 1.5 oz. liquor.)  Make sure that ANY health care provider who  prescribes medication for you knows that you are taking Coumadin (warfarin).  Also make sure the healthcare provider who is monitoring your Coumadin knows when you have started a new medication including herbals and non-prescription products.  Coumadin (Warfarin)  Major Drug Interactions  Increased Warfarin Effect Decreased Warfarin Effect  Alcohol (large quantities) Antibiotics (esp. Septra/Bactrim, Flagyl, Cipro) Amiodarone (Cordarone) Aspirin (ASA) Cimetidine (Tagamet) Megestrol (Megace) NSAIDs (ibuprofen, naproxen, etc.) Piroxicam (Feldene) Propafenone (Rythmol SR) Propranolol (Inderal) Isoniazid (INH) Posaconazole (Noxafil) Barbiturates (Phenobarbital) Carbamazepine (Tegretol) Chlordiazepoxide (Librium) Cholestyramine (Questran) Griseofulvin Oral Contraceptives Rifampin Sucralfate (Carafate) Vitamin K   Coumadin (Warfarin) Major Herbal Interactions  Increased Warfarin Effect Decreased Warfarin Effect  Garlic Ginseng Ginkgo biloba Coenzyme Q10 Green tea St. John's wort    Coumadin (Warfarin) FOOD Interactions  Eat a consistent number of servings per week of foods HIGH in Vitamin K (1 serving =  cup)  Collards (cooked, or boiled & drained) Kale (cooked, or boiled & drained) Mustard greens (cooked, or boiled & drained) Parsley *serving size only =  cup Spinach (cooked, or boiled & drained) Swiss chard (cooked, or boiled & drained) Turnip greens (cooked, or boiled & drained)  Eat a consistent number of servings per week of foods MEDIUM-HIGH in Vitamin K (1 serving = 1 cup)  Asparagus (cooked, or boiled & drained) Broccoli (cooked, boiled & drained, or raw & chopped) Brussel sprouts (cooked, or boiled & drained) *serving size only =  cup  Lettuce, raw (green leaf, endive, romaine) Spinach, raw Turnip greens, raw & chopped   These websites have more information on Coumadin (warfarin):  FailFactory.se; VeganReport.com.au;

## 2019-10-23 NOTE — Progress Notes (Signed)
Physical Therapy Treatment Patient Details Name: [name] MRN: [id number] DOB: [birth year] Today's Date: 10/23/2019    History of Present Illness Pt is a 74 y/o female s/p L TKA. PMH includes cardiomyopathy, glaucoma, ESRD on HD, HTN, MI, and a fib.     PT Comments    Pt progressing steadily towards their physical therapy goals, verbalizing fair pain control. Session focused on continued gait training and therapeutic exercises for strengthening/ROM. Ambulating 200 feet with a walker at a min guard assist level. Presents with decreased endurance, requiring several standing rest breaks; although this seems to be close to her baseline as she is a household ambulator mainly. Will continue to progress as tolerated.    Follow Up Recommendations  Supervision for mobility/OOB;Home health PT     Equipment Recommendations  Rolling walker with 5" wheels    Recommendations for Other Services       Precautions / Restrictions Precautions Precautions: Knee Precaution Booklet Issued: No Precaution Comments: Verbally reviewed knee precautions.  Restrictions Weight Bearing Restrictions: Yes LLE Weight Bearing: Weight bearing as tolerated    Mobility  Bed Mobility Overal bed mobility: Needs Assistance Bed Mobility: Supine to Sit;Sit to Supine     Supine to sit: Min guard Sit to supine: Min assist   General bed mobility comments: Min guard to progress out of bed; no physical assist required. Light minA for LLE negotiation back into bed   Transfers Overall transfer level: Needs assistance Equipment used: Rolling walker (2 wheeled) Transfers: Sit to/from Stand Sit to Stand: Min guard         General transfer comment: Min guard to rise from edge of bed and toilet  Ambulation/Gait Ambulation/Gait assistance: Min guard Gait Distance (Feet): 200 Feet Assistive device: Rolling walker (2 wheeled) Gait Pattern/deviations: Step-to pattern;Step-through pattern;Antalgic;Decreased step  length - left;Decreased dorsiflexion - left Gait velocity: decreased Gait velocity interpretation: <1.8 ft/sec, indicate of risk for recurrent falls General Gait Details: Min cues for sequencing, neutral L foot positioning, heel strike at initial contact. Pt able to self cue for activity pacing. Requiring several short standing rest breaks.    Stairs             Wheelchair Mobility    Modified Rankin (Stroke Patients Only)       Balance Overall balance assessment: Needs assistance Sitting-balance support: Feet supported;No upper extremity supported Sitting balance-Leahy Scale: Good     Standing balance support: Bilateral upper extremity supported Standing balance-Leahy Scale: Fair                              Cognition Arousal/Alertness: Awake/alert Behavior During Therapy: WFL for tasks assessed/performed Overall Cognitive Status: Within Functional Limits for tasks assessed                                        Exercises Total Joint Exercises Quad Sets: Left;10 reps;Supine Straight Leg Raises: Left;10 reps;Supine Long Arc Quad: Left;10 reps;Seated    General Comments        Pertinent Vitals/Pain Pain Assessment: Faces Faces Pain Scale: Hurts little more Pain Location: L knee  Pain Descriptors / Indicators: Aching;Grimacing;Guarding Pain Intervention(s): Monitored during session    Home Living                      Prior Function  PT Goals (current goals can now be found in the care plan section) Acute Rehab PT Goals Patient Stated Goal: to feel better PT Goal Formulation: With patient Time For Goal Achievement: 11/05/19 Potential to Achieve Goals: Good Progress towards PT goals: Progressing toward goals    Frequency    7X/week      PT Plan Current plan remains appropriate    Co-evaluation              AM-PAC PT "6 Clicks" Mobility   Outcome Measure  Help needed turning from your  back to your side while in a flat bed without using bedrails?: None Help needed moving from lying on your back to sitting on the side of a flat bed without using bedrails?: A Little Help needed moving to and from a bed to a chair (including a wheelchair)?: A Little Help needed standing up from a chair using your arms (e.g., wheelchair or bedside chair)?: A Little Help needed to walk in hospital room?: A Little Help needed climbing 3-5 steps with a railing? : A Little 6 Click Score: 19    End of Session Equipment Utilized During Treatment: Gait belt Activity Tolerance: Patient tolerated treatment well Patient left: with call bell/phone within reach;in bed;with bed alarm set Nurse Communication: Mobility status;Patient requests pain meds PT Visit Diagnosis: Muscle weakness (generalized) (M62.81);Difficulty in walking, not elsewhere classified (R26.2);Pain Pain - Right/Left: Left Pain - part of body: Knee     Time: 1450-1530 PT Time Calculation (min) (ACUTE ONLY): 40 min  Charges:  $Gait Training: 8-22 mins $Therapeutic Activity: 23-37 mins                       Wyona Almas, PT, DPT Acute Rehabilitation Services Pager [phone] Office [phone]    Deno Etienne 10/23/2019, 5:23 PM

## 2019-10-23 NOTE — Plan of Care (Signed)

## 2019-10-23 NOTE — Progress Notes (Addendum)
Physical Therapy Treatment Patient Details Name: [name] MRN: [id number] DOB: 1945-07-21 Today's Date: 10/23/2019    History of Present Illness [name] is a 74 y/o female s/p L TKA. PMH includes cardiomyopathy, glaucoma, ESRD on HD, HTN, MI, and a fib.     [name] Comments    Patient is progressing very well towards their physical therapy goals, demonstrating improved activity tolerance this session. Session focused on initiation of home exercise program and gait training. [name] ambulating 120 feet with a walker at a min guard assist level. Demonstrates 0-88 degrees seated knee flexion. Displays left lower extremity weakness, decreased ROM, balance impairments, and gait abnormalities. Will continue to progress as tolerated.     Follow Up Recommendations  Supervision for mobility/OOB;Home health [name]     Equipment Recommendations  Rolling walker with 5" wheels    Recommendations for Other Services       Precautions / Restrictions Precautions Precautions: Knee Precaution Booklet Issued: No Precaution Comments: Verbally reviewed knee precautions.  Required Braces or Orthoses: Knee Immobilizer - Left Knee Immobilizer - Left: Discontinue once straight leg raise with < 10 degree lag Restrictions Weight Bearing Restrictions: Yes LLE Weight Bearing: Weight bearing as tolerated    Mobility  Bed Mobility Overal bed mobility: Needs Assistance Bed Mobility: Supine to Sit     Supine to sit: Min guard     General bed mobility comments: Min guard to progress out of bed; no physical assist required.   Transfers Overall transfer level: Needs assistance Equipment used: Rolling walker (2 wheeled) Transfers: Sit to/from Stand Sit to Stand: Min guard         General transfer comment: Min guard to rise from edge of bed; [name] pulling up from walker so [name] stabilized  Ambulation/Gait Ambulation/Gait assistance: Min guard Gait Distance (Feet): 120 Feet Assistive device: Rolling walker (2  wheeled) Gait Pattern/deviations: Step-to pattern;Step-through pattern;Antalgic;Decreased step length - left;Decreased dorsiflexion - left Gait velocity: decreased Gait velocity interpretation: <1.8 ft/sec, indicate of risk for recurrent falls General Gait Details: Cues for sequencing, walker proximity, decreased R step length, increased L step length, step through gait pattern. Slow and steady pace albeit antalgic gait.   Stairs             Wheelchair Mobility    Modified Rankin (Stroke Patients Only)       Balance Overall balance assessment: Needs assistance Sitting-balance support: Feet supported;No upper extremity supported Sitting balance-Leahy Scale: Good     Standing balance support: Bilateral upper extremity supported Standing balance-Leahy Scale: Fair                              Cognition Arousal/Alertness: Awake/alert Behavior During Therapy: WFL for tasks assessed/performed Overall Cognitive Status: Within Functional Limits for tasks assessed                                        Exercises Total Joint Exercises Quad Sets: Left;10 reps;Supine Heel Slides: Left;15 reps;Supine;Seated Hip ABduction/ADduction: Left;10 reps;Supine Straight Leg Raises: Left;10 reps;Supine Goniometric ROM: Seated: 0-88 degrees knee flexion    General Comments        Pertinent Vitals/Pain Pain Assessment: Faces Faces Pain Scale: Hurts little more Pain Location: L knee  Pain Descriptors / Indicators: Aching;Grimacing;Guarding Pain Intervention(s): Monitored during session;Patient requesting pain meds-RN notified;Ice applied;Repositioned    Home Living  Prior Function            [name] Goals (current goals can now be found in the care plan section) Acute Rehab [name] Goals Patient Stated Goal: to feel better [name] Goal Formulation: With patient Time For Goal Achievement: 11/05/19 Potential to Achieve Goals:  Good Progress towards [name] goals: Progressing toward goals    Frequency    7X/week      [name] Plan Discharge plan needs to be updated    Co-evaluation              AM-PAC [name] "6 Clicks" Mobility   Outcome Measure  Help needed turning from your back to your side while in a flat bed without using bedrails?: None Help needed moving from lying on your back to sitting on the side of a flat bed without using bedrails?: A Little Help needed moving to and from a bed to a chair (including a wheelchair)?: A Little Help needed standing up from a chair using your arms (e.g., wheelchair or bedside chair)?: A Little Help needed to walk in hospital room?: A Little Help needed climbing 3-5 steps with a railing? : A Little 6 Click Score: 19    End of Session Equipment Utilized During Treatment: Gait belt;Left knee immobilizer Activity Tolerance: Patient tolerated treatment well Patient left: with call bell/phone within reach;in chair;with chair alarm set Nurse Communication: Mobility status;Patient requests pain meds [name] Visit Diagnosis: Muscle weakness (generalized) (M62.81);Difficulty in walking, not elsewhere classified (R26.2);Pain Pain - Right/Left: Left Pain - part of body: Knee     Time: 1700-1749 [name] Time Calculation (min) (ACUTE ONLY): 30 min  Charges:  $Gait Training: 8-22 mins $Therapeutic Exercise: 8-22 mins                       [name], [name], [name] Acute Rehabilitation Services Pager [phone] Office [phone]    Deno Etienne 10/23/2019, 9:30 AM

## 2019-10-23 NOTE — Progress Notes (Signed)
ANTICOAGULATION CONSULT NOTE - f/u  Consult  Pharmacy Consult for Coumadin Indication: atrial fibrillation  Allergies  Allergen Reactions  . Astemizole Nausea And Vomiting  . Fluorouracil Rash  . Vicodin [Hydrocodone-Acetaminophen] Nausea And Vomiting  . Chlorhexidine Rash    Sunburn    rash  . Percocet [Oxycodone-Acetaminophen] Nausea And Vomiting    Patient Measurements: Height: 5\' 3"  (160 cm) Weight: 83.7 kg (184 lb 9 oz) IBW/kg (Calculated) : 52.4  Vital Signs: Temp: 97.7 F (36.5 C) (05/05 0428) Temp Source: Oral (05/05 0428) BP: 153/66 (05/05 0428) Pulse Rate: 76 (05/05 0428)  Labs: Recent Labs    10/22/19 0603 10/22/19 0658 10/23/19 0515  HGB  --  11.6* 10.0*  HCT  --  34.0* 31.7*  PLT  --   --  197  LABPROT 14.6  --  14.7  INR 1.2  --  1.2  CREATININE  --  4.00* 5.64*    Estimated Creatinine Clearance: 9 mL/min (A) (by C-G formula based on SCr of 5.64 mg/dL (H)).   Medical History: Past Medical History:  Diagnosis Date  . Arthritis of left knee   . Cardiomyopathy ([place])    a. h/o LV dysfunction EF 20-25% in 2013 due to sepsis.>> improved to normal   . Chronic diastolic CHF (congestive heart failure) ([place])    10/ 2013 in setting of septic shock  . Complication of anesthesia    use a little anesthesia , per patient MD states she quit breathing (2016); hard to wake up  . ESRD (end stage renal disease) ([place])    dialysis Tues, Thurs, Sat [address], sees dr deterding   . GERD (gastroesophageal reflux disease)   . Glaucoma    both eyes  . H/O hiatal hernia    a. CT 2017: large gastric hiatal hernia.  Marland Kitchen Headache(784.0)    migraine hx of  . History of blood transfusion 04/13/2015   . History of echocardiogram    a. Echo 6/17: EF 60-65%, normal wall motion, mild MR, atrial septal lipomatous hypertrophy, PASP 34 mmHg, possible trivial free-flowing pericardial effusion along RV free wall // b. Echo 5/17: Mild LVH, EF 55-60%, normal wall motion, grade 1  diastolic dysfunction, trivial MR, severe LAE, mild RAE, PASP 42 mmHg  . History of kidney stones    10/18/2019: per patient "has a couple currently one in each kidney"  . History of nephrostomy 04/11/2015   currently inplace 04/28/2015  removed now  . History of nuclear stress test    a. Myoview 1/14 - Marked ischemia in the basal anterior, mid anterior, apical septal and apical inferior regions, EF 63% >> LHC normal   . Hyperlipidemia   . Hypertension    medication removed from regimen due to low blood pressure   . Iron deficiency    hx  . Myocardial infarction [facility]) 2013   10/18/2019: per patient "in 2013)  . Nephrolithiasis 2002, 2006   bilateral  . Normal coronary arteries 2014   a. LHC in 1/14: normal coronary arteries  . PAF (paroxysmal atrial fibrillation) ([place])    a. 10/ 2013  in setting of Septic Shock //  b. recurrent during admit for pneumonia, L effusion >> placed on Amiodarone // Coumadin for anticoagulation  . Pneumonia jan 2018, last tme lungs clear now   dx 10-06-2014 per CXR--  on 10-27-2014 pt states finished antibiotic and denies cough or fever  . Primary localized osteoarthritis of right hip 10/22/2019  . S/P hemodialysis catheter insertion ([place])  04/11/2015    right anterior chest , only used once   . Sigmoid diverticulosis   . SOB (shortness of breath) on exertion    10/18/2019: per patient "get short of breath with activity sometimes due to heart valve"  . UTI (urinary tract infection) 05/10/2016    Assessment: Anticoag: Coumadin PTA for afib. INR still 1.2. Hgb down to 10 post-op. - PTA dosing per patient: 2.5mg  daily except 5mg  on Thursdays.  Renal:ESRD . K up to 5.1. Removed K+ from the IVF and messaged MD. Probably needs to cut back the fluid rate too.  Goal of Therapy:  INR 2-3 Monitor platelets by anticoagulation protocol: Yes   Plan:  Coumadin 6 mg po x 1 tonight Daily INR    Eryanna Regal S. Alford Highland, PharmD, BCPS Clinical Staff  Pharmacist [email or web address] Alford Highland, [place] 10/23/2019,8:06 AM

## 2019-10-24 DIAGNOSIS — D688 Other specified coagulation defects: Secondary | ICD-10-CM | POA: Diagnosis not present

## 2019-10-24 DIAGNOSIS — I482 Chronic atrial fibrillation, unspecified: Secondary | ICD-10-CM | POA: Diagnosis not present

## 2019-10-24 DIAGNOSIS — N390 Urinary tract infection, site not specified: Secondary | ICD-10-CM | POA: Diagnosis not present

## 2019-10-24 DIAGNOSIS — D631 Anemia in chronic kidney disease: Secondary | ICD-10-CM | POA: Diagnosis not present

## 2019-10-24 DIAGNOSIS — N186 End stage renal disease: Secondary | ICD-10-CM | POA: Diagnosis not present

## 2019-10-24 DIAGNOSIS — M109 Gout, unspecified: Secondary | ICD-10-CM | POA: Diagnosis not present

## 2019-10-24 DIAGNOSIS — Z5181 Encounter for therapeutic drug level monitoring: Secondary | ICD-10-CM | POA: Diagnosis not present

## 2019-10-24 DIAGNOSIS — Z992 Dependence on renal dialysis: Secondary | ICD-10-CM | POA: Diagnosis not present

## 2019-10-24 DIAGNOSIS — R519 Headache, unspecified: Secondary | ICD-10-CM | POA: Diagnosis not present

## 2019-10-24 DIAGNOSIS — D509 Iron deficiency anemia, unspecified: Secondary | ICD-10-CM | POA: Diagnosis not present

## 2019-10-24 DIAGNOSIS — E039 Hypothyroidism, unspecified: Secondary | ICD-10-CM | POA: Diagnosis not present

## 2019-10-24 DIAGNOSIS — R197 Diarrhea, unspecified: Secondary | ICD-10-CM | POA: Diagnosis not present

## 2019-10-24 DIAGNOSIS — N2581 Secondary hyperparathyroidism of renal origin: Secondary | ICD-10-CM | POA: Diagnosis not present

## 2019-10-24 LAB — CBC
HCT: 31.8 % — ABNORMAL LOW (ref 36.0–46.0)
Hemoglobin: 9.9 g/dL — ABNORMAL LOW (ref 12.0–15.0)
MCH: 29.2 pg (ref 26.0–34.0)
MCHC: 31.1 g/dL (ref 30.0–36.0)
MCV: 93.8 fL (ref 80.0–100.0)
Platelets: 224 10*3/uL (ref 150–400)
RBC: 3.39 MIL/uL — ABNORMAL LOW (ref 3.87–5.11)
RDW: 18 % — ABNORMAL HIGH (ref 11.5–15.5)
WBC: 13.9 10*3/uL — ABNORMAL HIGH (ref 4.0–10.5)
nRBC: 0 % (ref 0.0–0.2)

## 2019-10-24 LAB — RENAL FUNCTION PANEL
Albumin: 3.1 g/dL — ABNORMAL LOW (ref 3.5–5.0)
Anion gap: 11 (ref 5–15)
BUN: 52 mg/dL — ABNORMAL HIGH (ref 8–23)
CO2: 27 mmol/L (ref 22–32)
Calcium: 9.3 mg/dL (ref 8.9–10.3)
Chloride: 94 mmol/L — ABNORMAL LOW (ref 98–111)
Creatinine, Ser: 6.74 mg/dL — ABNORMAL HIGH (ref 0.44–1.00)
GFR calc Af Amer: 6 mL/min — ABNORMAL LOW (ref >60)
GFR calc non Af Amer: 6 mL/min — ABNORMAL LOW (ref >60)
Glucose, Bld: 123 mg/dL — ABNORMAL HIGH (ref 70–99)
Phosphorus: 4.1 mg/dL (ref 2.5–4.6)
Potassium: 4.8 mmol/L (ref 3.5–5.1)
Sodium: 132 mmol/L — ABNORMAL LOW (ref 135–145)

## 2019-10-24 LAB — PROTIME-INR
INR: 2.1 — ABNORMAL HIGH (ref 0.8–1.2)
Prothrombin Time: 22.6 seconds — ABNORMAL HIGH (ref 11.4–15.2)

## 2019-10-24 MED ORDER — WARFARIN SODIUM 5 MG PO TABS: ORAL_TABLET | ORAL | 1 refills | Status: DC

## 2019-10-24 MED ORDER — POLYETHYLENE GLYCOL 3350 17 G PO PACK: 17.0000 g | PACK | Freq: Every day | ORAL | Status: DC | PRN

## 2019-10-24 MED ORDER — OXYCODONE HCL 5 MG PO TABS: 5.0000 mg | ORAL_TABLET | ORAL | 0 refills | Status: DC | PRN

## 2019-10-24 NOTE — Progress Notes (Signed)
Renal Navigator discussed discharge plan with Marius Ditch PA, Renal PA/L. Penninger, and patient/patient's daughter. Patient can be discharged prior to HD today in order to best care for patient by the staff who know her best, and keep inpt HD unit clear for inpatients only. She will have OP HD treatment at her clinic/[facility] at 12:30pm today after discharge. (She needs to arrive at 12:10pm). Patient's daughter prefers this, though patient feels a little nervous about her ability to get in her daughter's car. She states her plan to get home is in her daughter's car, so we discussed that if she is planning to go home in her daughter's car, then she can probably go to HD in her daughter's car. However, Navigator offered to apply for [employer] [place] for Dis[employer] Lucianne Lei transportation for her next session. Patient was agreeable and appreciative. Patient states she does not have her "equipment." Navigator messaged Case Manager/H. Wile to please check in with patient regarding this need. RN notified of plan. Renal PA to send orders to OP HD clinic. Navigator obtained signatures on [employer] [place] application, which Navigator will submit.   Alphonzo Cruise, [place] Renal Navigator [phone]

## 2019-10-24 NOTE — Discharge Summary (Signed)
Discharge Summary  Patient ID: ELISHEVA FALLAS MRN: [id number] DOB/AGE: 75-Jul-1947 75 y.o.  Admit date: 10/22/2019 Discharge date: 10/24/2019  Admission Diagnoses:  Primary localized osteoarthritis of left knee  Discharge Diagnoses:  Principal Problem:   Primary localized osteoarthritis of left knee Active Problems:   S/P TKR (total knee replacement), left   Past Medical History:  Diagnosis Date  . Arthritis of left knee   . Cardiomyopathy ([place])    a. h/o LV dysfunction EF 20-25% in 2013 due to sepsis.>> improved to normal   . Chronic diastolic CHF (congestive heart failure) ([place])    10/ 2013 in setting of septic shock  . Complication of anesthesia    use a little anesthesia , per patient MD states she quit breathing (2016); hard to wake up  . ESRD (end stage renal disease) ([place])    dialysis Tues, Thurs, Sat [address], sees dr deterding   . GERD (gastroesophageal reflux disease)   . Glaucoma    both eyes  . H/O hiatal hernia    a. CT 2017: large gastric hiatal hernia.  Marland Kitchen Headache(784.0)    migraine hx of  . History of blood transfusion 04/13/2015   . History of echocardiogram    a. Echo 6/17: EF 60-65%, normal wall motion, mild MR, atrial septal lipomatous hypertrophy, PASP 34 mmHg, possible trivial free-flowing pericardial effusion along RV free wall // b. Echo 5/17: Mild LVH, EF 55-60%, normal wall motion, grade 1 diastolic dysfunction, trivial MR, severe LAE, mild RAE, PASP 42 mmHg  . History of kidney stones    10/18/2019: per patient "has a couple currently one in each kidney"  . History of nephrostomy 04/11/2015   currently inplace 04/28/2015  removed now  . History of nuclear stress test    a. Myoview 1/14 - Marked ischemia in the basal anterior, mid anterior, apical septal and apical inferior regions, EF 63% >> LHC normal   . Hyperlipidemia   . Hypertension    medication removed from regimen due to low blood pressure   . Iron deficiency    hx  . Myocardial  infarction [facility]) 2013   10/18/2019: per patient "in 2013)  . Nephrolithiasis 2002, 2006   bilateral  . Normal coronary arteries 2014   a. LHC in 1/14: normal coronary arteries  . PAF (paroxysmal atrial fibrillation) ([place])    a. 10/ 2013  in setting of Septic Shock //  b. recurrent during admit for pneumonia, L effusion >> placed on Amiodarone // Coumadin for anticoagulation  . Pneumonia jan 2018, last tme lungs clear now   dx 10-06-2014 per CXR--  on 10-27-2014 pt states finished antibiotic and denies cough or fever  . Primary localized osteoarthritis of right hip 10/22/2019  . S/P hemodialysis catheter insertion ([place]) 04/11/2015    right anterior chest , only used once   . Sigmoid diverticulosis   . SOB (shortness of breath) on exertion    10/18/2019: per patient "get short of breath with activity sometimes due to heart valve"  . UTI (urinary tract infection) 05/10/2016    Surgeries: Procedure(s): TOTAL KNEE ARTHROPLASTY on 10/22/2019   Consultants (if any): Treatment Team:  Claudia Desanctis, MD Roney Jaffe, MD  Discharged Condition: Improved  Hospital Course: SHANELLE CLONTZ is an 74 y.o. female who was admitted 10/22/2019 with a diagnosis of Primary localized osteoarthritis of left knee and went to the operating room on 10/22/2019 and underwent the above named procedures.  She was scheduled for inpatient hemodialysis  on 10/24/19, but was ready for discharge otherwise and thus was discharged so that she could have outpatient hemodialysis at her normal dialysis clinic.   She was given perioperative antibiotics:  Anti-infectives (From admission, onward)   Start     Dose/Rate Route Frequency Ordered Stop   10/23/19 1000  cephALEXin (KEFLEX) capsule 250 mg     250 mg Oral Daily 10/22/19 1230     10/22/19 2200  ceFAZolin (ANCEF) IVPB 2g/100 mL premix     2 g 200 mL/hr over 30 Minutes Intravenous  Once 10/22/19 1230 10/22/19 2135   10/22/19 0615  ceFAZolin (ANCEF) IVPB 2g/100 mL premix     2  g 200 mL/hr over 30 Minutes Intravenous On call to O.R. 10/22/19 0604 10/22/19 0800   10/22/19 0606  ceFAZolin (ANCEF) 2-4 GM/100ML-% IVPB    Note to Pharmacy: Gustavo Lah   : cabinet override      10/22/19 0606 10/22/19 1814    .  She was given sequential compression devices, early ambulation, and home coumadin for DVT prophylaxis.  She benefited maximally from the hospital stay and there were no complications.    Recent vital signs:  Vitals:   10/24/19 0457 10/24/19 0820  BP: (!) 142/61 (!) 135/58  Pulse: 79 78  Resp: 17 16  Temp: (!) 97.5 F (36.4 C) 97.6 F (36.4 C)  SpO2: 94% 91%    Recent laboratory studies:  Lab Results  Component Value Date   HGB 9.9 (L) 10/24/2019   HGB 10.0 (L) 10/23/2019   HGB 11.6 (L) 10/22/2019   Lab Results  Component Value Date   WBC 13.9 (H) 10/24/2019   PLT 224 10/24/2019   Lab Results  Component Value Date   INR 2.1 (H) 10/24/2019   Lab Results  Component Value Date   NA 132 (L) 10/24/2019   K 4.8 10/24/2019   CL 94 (L) 10/24/2019   CO2 27 10/24/2019   BUN 52 (H) 10/24/2019   CREATININE 6.74 (H) 10/24/2019   GLUCOSE 123 (H) 10/24/2019    Discharge Medications:   Allergies as of 10/24/2019      Reactions   Astemizole Nausea And Vomiting   Fluorouracil Rash   Vicodin [hydrocodone-acetaminophen] Nausea And Vomiting   Chlorhexidine Rash   Sunburn    rash   Percocet [oxycodone-acetaminophen] Nausea And Vomiting      Medication List    STOP taking these medications   acetaminophen 500 MG tablet Commonly known as: TYLENOL     TAKE these medications   amiodarone 200 MG tablet Commonly known as: PACERONE TAKE ONE TABLET BY MOUTH DAILY What changed:   how much to take  how to take this  when to take this  additional instructions   bimatoprost 0.01 % Soln Commonly known as: LUMIGAN Place 1 drop into both eyes at bedtime.   calcium acetate 667 MG capsule Commonly known as: PHOSLO Take 1,334 mg by mouth 3  (three) times daily with meals.   cephALEXin 250 MG capsule Commonly known as: KEFLEX Take 250 mg by mouth daily.   lidocaine-prilocaine cream Commonly known as: EMLA Apply 1 application topically 3 (three) times a week.   loperamide 2 MG tablet Commonly known as: IMODIUM A-D Take 4-6 mg by mouth 4 (four) times daily as needed for diarrhea or loose stools.   MIRCERA IJ as needed.   multivitamin Tabs tablet Take 1 tablet by mouth at bedtime.   ondansetron 4 MG tablet Commonly known as: ZOFRAN Take  4 mg by mouth every 8 (eight) hours as needed for nausea or vomiting.   oxyCODONE 5 MG immediate release tablet Commonly known as: Roxicodone Take 1 tablet (5 mg total) by mouth every 4 (four) hours as needed for severe pain.   saccharomyces boulardii 250 MG capsule Commonly known as: FLORASTOR Take 1 capsule (250 mg total) by mouth 2 (two) times daily.   sodium chloride 0.65 % Soln nasal spray Commonly known as: OCEAN Place 1 spray into both nostrils 3 (three) times daily as needed for congestion.   timolol 0.5 % ophthalmic solution Commonly known as: BETIMOL Place 1 drop into both eyes every morning.   warfarin 5 MG tablet Commonly known as: COUMADIN Take as directed. If you are unsure how to take this medication, talk to your nurse or doctor. Original instructions: TAKE 1/2 TO 1 TABLET BY MOUTH DAILY AS DIRECTED BY COUMADIN CLINIC  Hold Coumadin tonight 10/24/19 but resume normal home dose tomorrow (10/25/19) What changed: additional instructions       Diagnostic Studies: DG Knee Left Port  Result Date: 10/22/2019 CLINICAL DATA:  Status post total knee replacement EXAM: PORTABLE LEFT KNEE - 1-2 VIEW COMPARISON:  None. FINDINGS: The left knee demonstrates a total knee arthroplasty without evidence of hardware failure complication. There is no significant joint effusion. There is no fracture or dislocation. The alignment is anatomic. Post-surgical changes noted in the  surrounding soft tissues. IMPRESSION: Left total knee arthroplasty. Electronically Signed   By: Kathreen Devoid   On: 10/22/2019 11:44    Disposition: Discharge disposition: 01-Home or [place]    Marchia Bond, MD. Schedule an appointment as soon as possible for a visit in 2 week(s).   Specialty: Orthopedic Surgery Contact information: [address] [place] [address] [phone]            Signed: Jola Baptist 10/24/2019, 9:48 AM

## 2019-10-24 NOTE — Progress Notes (Signed)
ANTICOAGULATION CONSULT NOTE - f/u  Consult  Pharmacy Consult for Coumadin Indication: atrial fibrillation  Allergies  Allergen Reactions  . Astemizole Nausea And Vomiting  . Fluorouracil Rash  . Vicodin [Hydrocodone-Acetaminophen] Nausea And Vomiting  . Chlorhexidine Rash    Sunburn    rash  . Percocet [Oxycodone-Acetaminophen] Nausea And Vomiting    Patient Measurements: Height: 5\' 3"  (160 cm) Weight: 83.7 kg (184 lb 9 oz) IBW/kg (Calculated) : 52.4  Vital Signs: Temp: 97.6 F (36.4 C) (05/06 0820) Temp Source: Oral (05/06 0820) BP: 135/58 (05/06 0820) Pulse Rate: 78 (05/06 0820)  Labs: Recent Labs    10/22/19 0603 10/22/19 6599 10/22/19 0658 10/23/19 0515 10/24/19 0300  HGB  --  11.6*   < > 10.0* 9.9*  HCT  --  34.0*  --  31.7* 31.8*  PLT  --   --   --  197 224  LABPROT 14.6  --   --  14.7 22.6*  INR 1.2  --   --  1.2 2.1*  CREATININE  --  4.00*  --  5.64* 6.74*   < > = values in this interval not displayed.    Estimated Creatinine Clearance: 7.5 mL/min (A) (by C-G formula based on SCr of 6.74 mg/dL (H)).   Medical History: Past Medical History:  Diagnosis Date  . Arthritis of left knee   . Cardiomyopathy ([place])    a. h/o LV dysfunction EF 20-25% in 2013 due to sepsis.>> improved to normal   . Chronic diastolic CHF (congestive heart failure) ([place])    10/ 2013 in setting of septic shock  . Complication of anesthesia    use a little anesthesia , per patient MD states she quit breathing (2016); hard to wake up  . ESRD (end stage renal disease) ([place])    dialysis Tues, Thurs, Sat [address], sees dr deterding   . GERD (gastroesophageal reflux disease)   . Glaucoma    both eyes  . H/O hiatal hernia    a. CT 2017: large gastric hiatal hernia.  Marland Kitchen Headache(784.0)    migraine hx of  . History of blood transfusion 04/13/2015   . History of echocardiogram    a. Echo 6/17: EF 60-65%, normal wall motion, mild MR, atrial septal lipomatous hypertrophy, PASP 34  mmHg, possible trivial free-flowing pericardial effusion along RV free wall // b. Echo 5/17: Mild LVH, EF 55-60%, normal wall motion, grade 1 diastolic dysfunction, trivial MR, severe LAE, mild RAE, PASP 42 mmHg  . History of kidney stones    10/18/2019: per patient "has a couple currently one in each kidney"  . History of nephrostomy 04/11/2015   currently inplace 04/28/2015  removed now  . History of nuclear stress test    a. Myoview 1/14 - Marked ischemia in the basal anterior, mid anterior, apical septal and apical inferior regions, EF 63% >> LHC normal   . Hyperlipidemia   . Hypertension    medication removed from regimen due to low blood pressure   . Iron deficiency    hx  . Myocardial infarction [facility]) 2013   10/18/2019: per patient "in 2013)  . Nephrolithiasis 2002, 2006   bilateral  . Normal coronary arteries 2014   a. LHC in 1/14: normal coronary arteries  . PAF (paroxysmal atrial fibrillation) ([place])    a. 10/ 2013  in setting of Septic Shock //  b. recurrent during admit for pneumonia, L effusion >> placed on Amiodarone // Coumadin for anticoagulation  . Pneumonia jan  2018, last tme lungs clear now   dx 10-06-2014 per CXR--  on 10-27-2014 pt states finished antibiotic and denies cough or fever  . Primary localized osteoarthritis of right hip 10/22/2019  . S/P hemodialysis catheter insertion ([place]) 04/11/2015    right anterior chest , only used once   . Sigmoid diverticulosis   . SOB (shortness of breath) on exertion    10/18/2019: per patient "get short of breath with activity sometimes due to heart valve"  . UTI (urinary tract infection) 05/10/2016    Assessment:  Anticoag: Coumadin PTA for afib. INR 1.2>2.1 from loading dose. Hgb down to 9.9 post-op. - PTA dosing per patient: 2.5mg  daily except 5mg  on Thursdays.  Goal of Therapy:  INR 2-3 Monitor platelets by anticoagulation protocol: Yes   Plan:  Hold Coumadin tonight. Would then resume home dosing Daily INR Plan to  discharge home today after dialysis.    Giannie Soliday S. Alford Highland, PharmD, BCPS Clinical Staff Pharmacist [email or web address] Alford Highland, [place] Amparo [place] 10/24/2019,9:14 AM

## 2019-10-24 NOTE — Progress Notes (Signed)
Patient discharged home by private care.  All personal belongings gathered and taken by daughter with patient.  Discharge instructions given to both daughter and patient, both acknowledged understanding and denied further questions.  Patient is going straight to dialysis from here.

## 2019-10-24 NOTE — Plan of Care (Signed)

## 2019-10-24 NOTE — Progress Notes (Signed)
Subjective: 2 Days Post-Op s/p Procedure(s): TOTAL KNEE ARTHROPLASTY   Patient is alert, oriented, laying comfortably in bed. Reports moderate pain this morning but states she was able to sleep well over night. She had an episode of shortness of breath yesterday but denies any today. Denies chest pain or calf pain. Denies nausea, vomiting, or abdominal pain. Patient would like to go home today if she can.  Objective:  PE: VITALS:   Vitals:   10/23/19 1257 10/23/19 1956 10/24/19 0457 10/24/19 0820  BP: 139/61 (!) 158/81 (!) 142/61 (!) 135/58  Pulse: 82 81 79 78  Resp: 16 16 17 16   Temp: 98 F (36.7 C) 98.1 F (36.7 C) (!) 97.5 F (36.4 C) 97.6 F (36.4 C)  TempSrc: Oral Oral Oral Oral  SpO2: 90% 95% 94% 91%  Weight:      Height:       General: Alert, oriented, in no acute distress Resp; no use of accessory musculature MSK: LLE - 2+ DP pulse, dressings intact with mild drainage, EHL and FHL intact. Able to move all toes of left foot, distal sensation intact. No TTP to left calf.   LABS  Results for orders placed or performed during the hospital encounter of 10/22/19 (from the past 24 hour(s))  CBC     Status: Abnormal   Collection Time: 10/24/19  3:00 AM  Result Value Ref Range   WBC 13.9 (H) 4.0 - 10.5 K/uL   RBC 3.39 (L) 3.87 - 5.11 MIL/uL   Hemoglobin 9.9 (L) 12.0 - 15.0 g/dL   HCT 31.8 (L) 36.0 - 46.0 %   MCV 93.8 80.0 - 100.0 fL   MCH 29.2 26.0 - 34.0 pg   MCHC 31.1 30.0 - 36.0 g/dL   RDW 18.0 (H) 11.5 - 15.5 %   Platelets 224 150 - 400 K/uL   nRBC 0.0 0.0 - 0.2 %  Protime-INR     Status: Abnormal   Collection Time: 10/24/19  3:00 AM  Result Value Ref Range   Prothrombin Time 22.6 (H) 11.4 - 15.2 seconds   INR 2.1 (H) 0.8 - 1.2  Renal function panel     Status: Abnormal   Collection Time: 10/24/19  3:00 AM  Result Value Ref Range   Sodium 132 (L) 135 - 145 mmol/L   Potassium 4.8 3.5 - 5.1 mmol/L   Chloride 94 (L) 98 - 111 mmol/L   CO2 27 22 - 32  mmol/L   Glucose, Bld 123 (H) 70 - 99 mg/dL   BUN 52 (H) 8 - 23 mg/dL   Creatinine, Ser 6.74 (H) 0.44 - 1.00 mg/dL   Calcium 9.3 8.9 - 10.3 mg/dL   Phosphorus 4.1 2.5 - 4.6 mg/dL   Albumin 3.1 (L) 3.5 - 5.0 g/dL   GFR calc non Af Amer 6 (L) >60 mL/min   GFR calc Af Amer 6 (L) >60 mL/min   Anion gap 11 5 - 15    DG Knee Left Port  Result Date: 10/22/2019 CLINICAL DATA:  Status post total knee replacement EXAM: PORTABLE LEFT KNEE - 1-2 VIEW COMPARISON:  None. FINDINGS: The left knee demonstrates a total knee arthroplasty without evidence of hardware failure complication. There is no significant joint effusion. There is no fracture or dislocation. The alignment is anatomic. Post-surgical changes noted in the surrounding soft tissues. IMPRESSION: Left total knee arthroplasty. Electronically Signed   By: Kathreen Devoid   On: 10/22/2019 11:44    Assessment/Plan: Principal Problem:  Primary localized osteoarthritis of left knee Active Problems:   S/P TKR (total knee replacement), left  2 Days Post-Op s/p Procedure(s): TOTAL KNEE ARTHROPLASTY  Weightbearing: WBAT LLE Insicional and dressing care: Reinforce dressings as needed VTE prophylaxis: Home Coumadin Pain control: continue current regimen Follow - up plan: Follow up with Dr. Mardelle Matte in 2 weeks Dispo: Nephrology on board for dialysis today. Plan to discharge home today after dialysis.   Contact information:   Weekdays 8-5 [address], [place] [phone] A fter hours and holidays please check [email or web address] for group call information for [facility] [place] 10/24/2019, 8:37 AM

## 2019-10-24 NOTE — Progress Notes (Addendum)
Physical Therapy Treatment Patient Details Name: [name] MRN: [id number] DOB: July 08, 1945 Today's Date: 10/24/2019    History of Present Illness Pt is a 74 y/o female s/p L TKA. PMH includes cardiomyopathy, glaucoma, ESRD on HD, HTN, MI, and a fib.     PT Comments    Pt making excellent progress towards her physical therapy goals. Session focused on continued gait training, therapeutic exercises for ROM/strengthening, and stair training. Pt ambulating 200 feet with a walker at a min guard assist level. Able to negotiate one platform step with a walker to simulate her home set up. Achieving 95 degrees seated knee flexion today. Okay for d/c home from PT perspective. Would benefit from HHPT to continue to address deficits and maximize functional independence.    Follow Up Recommendations  Supervision for mobility/OOB;Home health PT     Equipment Recommendations  None recommended by PT (has RW)   Recommendations for Other Services       Precautions / Restrictions Precautions Precautions: Knee Precaution Booklet Issued: Yes (comment) Precaution Comments: Verbally reviewed knee precautions.  Restrictions Weight Bearing Restrictions: Yes LLE Weight Bearing: Weight bearing as tolerated    Mobility  Bed Mobility               General bed mobility comments: OOB in chair  Transfers Overall transfer level: Needs assistance Equipment used: Rolling walker (2 wheeled) Transfers: Sit to/from Stand Sit to Stand: Min guard         General transfer comment: Min guard to rise from recliner  Ambulation/Gait Ambulation/Gait assistance: Min guard Gait Distance (Feet): 200 Feet Assistive device: Rolling walker (2 wheeled) Gait Pattern/deviations: Step-through pattern;Antalgic;Decreased step length - left;Decreased dorsiflexion - left Gait velocity: decreased   General Gait Details: Min cues for equal step length, pt able to self cue for activity pacing   Stairs Stairs:  Yes Stairs assistance: Min guard Stair Management: With walker Number of Stairs: 1 General stair comments: Pt ascended/descended one platform step with walker, cues for sequencing and walker positioning   Wheelchair Mobility    Modified Rankin (Stroke Patients Only)       Balance Overall balance assessment: Needs assistance Sitting-balance support: Feet supported;No upper extremity supported Sitting balance-Leahy Scale: Good     Standing balance support: Bilateral upper extremity supported Standing balance-Leahy Scale: Fair                              Cognition Arousal/Alertness: Awake/alert Behavior During Therapy: WFL for tasks assessed/performed Overall Cognitive Status: Within Functional Limits for tasks assessed                                        Exercises Total Joint Exercises Quad Sets: Both;15 reps;Seated Heel Slides: Left;10 reps;Seated Long Arc Quad: Left;Seated;15 reps    General Comments        Pertinent Vitals/Pain Pain Assessment: Faces Faces Pain Scale: Hurts a little bit Pain Location: L knee  Pain Descriptors / Indicators: Aching;Guarding Pain Intervention(s): Monitored during session    Home Living                      Prior Function            PT Goals (current goals can now be found in the care plan section) Acute Rehab PT Goals Patient Stated Goal:  to feel better PT Goal Formulation: With patient Time For Goal Achievement: 11/05/19 Potential to Achieve Goals: Good Progress towards PT goals: Progressing toward goals    Frequency    7X/week      PT Plan Current plan remains appropriate    Co-evaluation              AM-PAC PT "6 Clicks" Mobility   Outcome Measure  Help needed turning from your back to your side while in a flat bed without using bedrails?: None Help needed moving from lying on your back to sitting on the side of a flat bed without using bedrails?: A  Little Help needed moving to and from a bed to a chair (including a wheelchair)?: A Little Help needed standing up from a chair using your arms (e.g., wheelchair or bedside chair)?: A Little Help needed to walk in hospital room?: A Little Help needed climbing 3-5 steps with a railing? : A Little 6 Click Score: 19    End of Session Equipment Utilized During Treatment: Gait belt Activity Tolerance: Patient tolerated treatment well Patient left: with call bell/phone within reach;in bed;with bed alarm set Nurse Communication: Mobility status;Patient requests pain meds PT Visit Diagnosis: Muscle weakness (generalized) (M62.81);Difficulty in walking, not elsewhere classified (R26.2);Pain Pain - Right/Left: Left Pain - part of body: Knee     Time: 9509-3267 PT Time Calculation (min) (ACUTE ONLY): 33 min  Charges:  $Gait Training: 8-22 mins $Therapeutic Exercise: 8-22 mins                       [name], PT, DPT Acute Rehabilitation Services Pager [phone] Office [phone]    Deno Etienne 10/24/2019, 9:14 AM

## 2019-10-24 NOTE — TOC Transition Note (Signed)
Transition of Care [facility]) - CM/SW Discharge Note   Patient Details  Name: [name] MRN: [id number] Date of Birth: 02-09-1946  Transition of Care [facility] CM/SW Contact:  Marilu Favre, RN Phone Number: 10/24/2019, 10:38 AM   Clinical Narrative:     Patient from home , has a walker at home already. Patient wants [place] for HHPT. Messaged and called PA. Patient needs to get to HD by 1210. NCM entered orders , will need to be signed. Butch Penny with [facility] aware.   Final next level of care: [place] Barriers to Discharge: No Barriers Identified   Patient Goals and CMS Choice Patient states their goals for this hospitalization and ongoing recovery are:: to go home CMS Medicare.gov Compare Post Acute Care list provided to:: Patient Choice offered to / list presented to : Patient  Discharge Placement                       Discharge Plan and Services   Discharge Planning Services: CM Consult Post Acute Care Choice: Home Health          DME Arranged: N/A         HH Arranged: PT [place] Agency: [place] ([facility]) Date HH Agency Contacted: 10/24/19 Time [place]: 0102 Representative spoke with at [place]: [place] ([place]) Interventions     Readmission Risk Interventions No flowsheet data found.

## 2019-10-24 NOTE — Progress Notes (Signed)
[facility] Progress Note   Subjective:   Patient seen and examined in room.  Reports feeling pretty well post surgery.  PT went well this morning.  Plan is to go home and go to OP dialysis today.  Admits to LE edema.  Denies SOB, CP, n/v/d, abdominal pain, dizziness and fatigue.   Objective Vitals:   10/23/19 1257 10/23/19 1956 10/24/19 0457 10/24/19 0820  BP: 139/61 (!) 158/81 (!) 142/61 (!) 135/58  Pulse: 82 81 79 78  Resp: 16 16 17 16   Temp: 98 F (36.7 C) 98.1 F (36.7 C) (!) 97.5 F (36.4 C) 97.6 F (36.4 C)  TempSrc: Oral Oral Oral Oral  SpO2: 90% 95% 94% 91%  Weight:      Height:       Physical Exam General:NAD, WDWN female, sitting in bedside chair Heart:RRR, +6/8 systolic murmur Lungs:CTAB Abdomen:soft, NTND Extremities:1+ edema b/l Dialysis Access: LU AVF +b/t  Filed Weights   10/22/19 0626  Weight: 83.7 kg    Intake/Output Summary (Last 24 hours) at 10/24/2019 1035 Last data filed at 10/24/2019 0900 Gross per 24 hour  Intake 720 ml  Output 350 ml  Net 370 ml    Additional Objective Labs: Basic Metabolic Panel: Recent Labs  Lab 10/18/19 1043 10/18/19 1043 10/22/19 0658 10/23/19 0515 10/24/19 0300  NA 138   < > 138 135 132*  K 3.9   < > 3.6 5.1 4.8  CL 94*   < > 92* 93* 94*  CO2 30  --   --  28 27  GLUCOSE 86   < > 86 125* 123*  BUN 27*   < > 24* 34* 52*  CREATININE 5.01*   < > 4.00* 5.64* 6.74*  CALCIUM 9.6  --   --  9.5 9.3  PHOS  --   --   --   --  4.1   < > = values in this interval not displayed.   Liver Function Tests: Recent Labs  Lab 10/24/19 0300  ALBUMIN 3.1*   CBC: Recent Labs  Lab 10/18/19 1043 10/18/19 1043 10/22/19 0658 10/23/19 0515 10/24/19 0300  WBC 9.7  --   --  13.4* 13.9*  HGB 11.8*   < > 11.6* 10.0* 9.9*  HCT 38.5   < > 34.0* 31.7* 31.8*  MCV 95.8  --   --  94.6 93.8  PLT 208  --   --  197 224   < > = values in this interval not displayed.    Lab Results  Component Value Date   INR 2.1  (H) 10/24/2019   INR 1.2 10/23/2019   INR 1.2 10/22/2019   Studies/Results: DG Knee Left Port  Result Date: 10/22/2019 CLINICAL DATA:  Status post total knee replacement EXAM: PORTABLE LEFT KNEE - 1-2 VIEW COMPARISON:  None. FINDINGS: The left knee demonstrates a total knee arthroplasty without evidence of hardware failure complication. There is no significant joint effusion. There is no fracture or dislocation. The alignment is anatomic. Post-surgical changes noted in the surrounding soft tissues. IMPRESSION: Left total knee arthroplasty. Electronically Signed   By: Kathreen Devoid   On: 10/22/2019 11:44    Medications: . methocarbamol (ROBAXIN) IV     . amiodarone  200 mg Oral Daily  . calcium acetate  1,334 mg Oral TID WC  . cephALEXin  250 mg Oral Daily  . Chlorhexidine Gluconate Cloth  6 each Topical Q0600  . docusate sodium  100 mg Oral BID  .  latanoprost  1 drop Both Eyes QHS  . lidocaine-prilocaine  1 application Topical Once per day on Mon Wed Fri  . multivitamin  1 tablet Oral QHS  . saccharomyces boulardii  250 mg Oral BID  . timolol  1 drop Both Eyes q morning - 10a  . Warfarin - Pharmacist Dosing Inpatient   Does not apply q1600    Dialysis Orders: [facility] TTS 4 hours  400 BF/ 800 DF  83 kg EDW 2K/2 Calcium  She is not ordered ESA or activated vit D Last Hb 11.4 on 4/29  Assessment/Plan: 1. OA - s/p TKA on L on 5/4 2. ESRD - on HD TTS. Plans for HD today at OP center.  K 4.8.  3. Anemia of CKD- Hgb 9.9, will start low dose ESA at OP center 4. Secondary hyperparathyroidism - Ca and phos at goal. Continue binders 5. HTN/volume - Blood pressure well controlled.  Some LE edema, does not appear volume overloaded.  Plan for HD to dry weight.  6. Nutrition - Renal diet w/fluid restrictions.   Jen Mow, PA-C [facility] Pager: [phone] 10/24/2019,10:35 AM  LOS: 2 days

## 2019-10-25 DIAGNOSIS — Z471 Aftercare following joint replacement surgery: Secondary | ICD-10-CM | POA: Diagnosis not present

## 2019-10-25 DIAGNOSIS — I132 Hypertensive heart and chronic kidney disease with heart failure and with stage 5 chronic kidney disease, or end stage renal disease: Secondary | ICD-10-CM | POA: Diagnosis not present

## 2019-10-25 DIAGNOSIS — Z96652 Presence of left artificial knee joint: Secondary | ICD-10-CM | POA: Diagnosis not present

## 2019-10-25 DIAGNOSIS — I252 Old myocardial infarction: Secondary | ICD-10-CM | POA: Diagnosis not present

## 2019-10-25 DIAGNOSIS — Z87442 Personal history of urinary calculi: Secondary | ICD-10-CM | POA: Diagnosis not present

## 2019-10-25 DIAGNOSIS — Z87440 Personal history of urinary (tract) infections: Secondary | ICD-10-CM | POA: Diagnosis not present

## 2019-10-25 DIAGNOSIS — Z7901 Long term (current) use of anticoagulants: Secondary | ICD-10-CM | POA: Diagnosis not present

## 2019-10-25 DIAGNOSIS — I429 Cardiomyopathy, unspecified: Secondary | ICD-10-CM | POA: Diagnosis not present

## 2019-10-25 DIAGNOSIS — K219 Gastro-esophageal reflux disease without esophagitis: Secondary | ICD-10-CM | POA: Diagnosis not present

## 2019-10-25 DIAGNOSIS — N186 End stage renal disease: Secondary | ICD-10-CM | POA: Diagnosis not present

## 2019-10-25 DIAGNOSIS — I480 Paroxysmal atrial fibrillation: Secondary | ICD-10-CM | POA: Diagnosis not present

## 2019-10-25 DIAGNOSIS — E611 Iron deficiency: Secondary | ICD-10-CM | POA: Diagnosis not present

## 2019-10-25 DIAGNOSIS — G43909 Migraine, unspecified, not intractable, without status migrainosus: Secondary | ICD-10-CM | POA: Diagnosis not present

## 2019-10-25 DIAGNOSIS — I5032 Chronic diastolic (congestive) heart failure: Secondary | ICD-10-CM | POA: Diagnosis not present

## 2019-10-25 DIAGNOSIS — E785 Hyperlipidemia, unspecified: Secondary | ICD-10-CM | POA: Diagnosis not present

## 2019-10-26 DIAGNOSIS — N186 End stage renal disease: Secondary | ICD-10-CM | POA: Diagnosis not present

## 2019-10-26 DIAGNOSIS — D631 Anemia in chronic kidney disease: Secondary | ICD-10-CM | POA: Diagnosis not present

## 2019-10-26 DIAGNOSIS — N390 Urinary tract infection, site not specified: Secondary | ICD-10-CM | POA: Diagnosis not present

## 2019-10-26 DIAGNOSIS — E039 Hypothyroidism, unspecified: Secondary | ICD-10-CM | POA: Diagnosis not present

## 2019-10-26 DIAGNOSIS — R197 Diarrhea, unspecified: Secondary | ICD-10-CM | POA: Diagnosis not present

## 2019-10-26 DIAGNOSIS — N2581 Secondary hyperparathyroidism of renal origin: Secondary | ICD-10-CM | POA: Diagnosis not present

## 2019-10-26 DIAGNOSIS — I482 Chronic atrial fibrillation, unspecified: Secondary | ICD-10-CM | POA: Diagnosis not present

## 2019-10-26 DIAGNOSIS — Z992 Dependence on renal dialysis: Secondary | ICD-10-CM | POA: Diagnosis not present

## 2019-10-26 DIAGNOSIS — Z5181 Encounter for therapeutic drug level monitoring: Secondary | ICD-10-CM | POA: Diagnosis not present

## 2019-10-26 DIAGNOSIS — R519 Headache, unspecified: Secondary | ICD-10-CM | POA: Diagnosis not present

## 2019-10-26 DIAGNOSIS — D509 Iron deficiency anemia, unspecified: Secondary | ICD-10-CM | POA: Diagnosis not present

## 2019-10-26 DIAGNOSIS — M109 Gout, unspecified: Secondary | ICD-10-CM | POA: Diagnosis not present

## 2019-10-26 DIAGNOSIS — D688 Other specified coagulation defects: Secondary | ICD-10-CM | POA: Diagnosis not present

## 2019-10-29 DIAGNOSIS — N390 Urinary tract infection, site not specified: Secondary | ICD-10-CM | POA: Diagnosis not present

## 2019-10-29 DIAGNOSIS — I482 Chronic atrial fibrillation, unspecified: Secondary | ICD-10-CM | POA: Diagnosis not present

## 2019-10-29 DIAGNOSIS — N186 End stage renal disease: Secondary | ICD-10-CM | POA: Diagnosis not present

## 2019-10-29 DIAGNOSIS — D688 Other specified coagulation defects: Secondary | ICD-10-CM | POA: Diagnosis not present

## 2019-10-29 DIAGNOSIS — M109 Gout, unspecified: Secondary | ICD-10-CM | POA: Diagnosis not present

## 2019-10-29 DIAGNOSIS — E039 Hypothyroidism, unspecified: Secondary | ICD-10-CM | POA: Diagnosis not present

## 2019-10-29 DIAGNOSIS — R519 Headache, unspecified: Secondary | ICD-10-CM | POA: Diagnosis not present

## 2019-10-29 DIAGNOSIS — Z5181 Encounter for therapeutic drug level monitoring: Secondary | ICD-10-CM | POA: Diagnosis not present

## 2019-10-29 DIAGNOSIS — R197 Diarrhea, unspecified: Secondary | ICD-10-CM | POA: Diagnosis not present

## 2019-10-29 DIAGNOSIS — D631 Anemia in chronic kidney disease: Secondary | ICD-10-CM | POA: Diagnosis not present

## 2019-10-29 DIAGNOSIS — N2581 Secondary hyperparathyroidism of renal origin: Secondary | ICD-10-CM | POA: Diagnosis not present

## 2019-10-29 DIAGNOSIS — Z992 Dependence on renal dialysis: Secondary | ICD-10-CM | POA: Diagnosis not present

## 2019-10-29 DIAGNOSIS — D509 Iron deficiency anemia, unspecified: Secondary | ICD-10-CM | POA: Diagnosis not present

## 2019-10-30 ENCOUNTER — Ambulatory Visit (INDEPENDENT_AMBULATORY_CARE_PROVIDER_SITE_OTHER): Payer: Medicare Other | Admitting: Pharmacist

## 2019-10-30 ENCOUNTER — Other Ambulatory Visit: Payer: Self-pay

## 2019-10-30 DIAGNOSIS — Z5181 Encounter for therapeutic drug level monitoring: Secondary | ICD-10-CM

## 2019-10-30 DIAGNOSIS — Z7901 Long term (current) use of anticoagulants: Secondary | ICD-10-CM

## 2019-10-30 DIAGNOSIS — I480 Paroxysmal atrial fibrillation: Secondary | ICD-10-CM | POA: Diagnosis not present

## 2019-10-30 DIAGNOSIS — I4891 Unspecified atrial fibrillation: Secondary | ICD-10-CM

## 2019-10-30 LAB — POCT INR: INR: 2.2 (ref 2.0–3.0)

## 2019-10-30 NOTE — Patient Instructions (Signed)
Description   Continue 1/2 tablet (2.5mg ) daily except for 1 tablet (5mg ) on Thursday.  Call with any new medications or if scheduled for any procedures or with any questions [phone]

## 2019-10-31 DIAGNOSIS — N390 Urinary tract infection, site not specified: Secondary | ICD-10-CM | POA: Diagnosis not present

## 2019-10-31 DIAGNOSIS — D631 Anemia in chronic kidney disease: Secondary | ICD-10-CM | POA: Diagnosis not present

## 2019-10-31 DIAGNOSIS — M109 Gout, unspecified: Secondary | ICD-10-CM | POA: Diagnosis not present

## 2019-10-31 DIAGNOSIS — R519 Headache, unspecified: Secondary | ICD-10-CM | POA: Diagnosis not present

## 2019-10-31 DIAGNOSIS — D688 Other specified coagulation defects: Secondary | ICD-10-CM | POA: Diagnosis not present

## 2019-10-31 DIAGNOSIS — Z5181 Encounter for therapeutic drug level monitoring: Secondary | ICD-10-CM | POA: Diagnosis not present

## 2019-10-31 DIAGNOSIS — D509 Iron deficiency anemia, unspecified: Secondary | ICD-10-CM | POA: Diagnosis not present

## 2019-10-31 DIAGNOSIS — Z992 Dependence on renal dialysis: Secondary | ICD-10-CM | POA: Diagnosis not present

## 2019-10-31 DIAGNOSIS — I482 Chronic atrial fibrillation, unspecified: Secondary | ICD-10-CM | POA: Diagnosis not present

## 2019-10-31 DIAGNOSIS — E039 Hypothyroidism, unspecified: Secondary | ICD-10-CM | POA: Diagnosis not present

## 2019-10-31 DIAGNOSIS — R197 Diarrhea, unspecified: Secondary | ICD-10-CM | POA: Diagnosis not present

## 2019-10-31 DIAGNOSIS — N186 End stage renal disease: Secondary | ICD-10-CM | POA: Diagnosis not present

## 2019-10-31 DIAGNOSIS — N2581 Secondary hyperparathyroidism of renal origin: Secondary | ICD-10-CM | POA: Diagnosis not present

## 2019-11-04 DIAGNOSIS — Z96652 Presence of left artificial knee joint: Secondary | ICD-10-CM | POA: Diagnosis not present

## 2019-11-05 DIAGNOSIS — R197 Diarrhea, unspecified: Secondary | ICD-10-CM | POA: Diagnosis not present

## 2019-11-05 DIAGNOSIS — M109 Gout, unspecified: Secondary | ICD-10-CM | POA: Diagnosis not present

## 2019-11-05 DIAGNOSIS — E039 Hypothyroidism, unspecified: Secondary | ICD-10-CM | POA: Diagnosis not present

## 2019-11-05 DIAGNOSIS — Z992 Dependence on renal dialysis: Secondary | ICD-10-CM | POA: Diagnosis not present

## 2019-11-05 DIAGNOSIS — N186 End stage renal disease: Secondary | ICD-10-CM | POA: Diagnosis not present

## 2019-11-05 DIAGNOSIS — N2581 Secondary hyperparathyroidism of renal origin: Secondary | ICD-10-CM | POA: Diagnosis not present

## 2019-11-05 DIAGNOSIS — D509 Iron deficiency anemia, unspecified: Secondary | ICD-10-CM | POA: Diagnosis not present

## 2019-11-05 DIAGNOSIS — R519 Headache, unspecified: Secondary | ICD-10-CM | POA: Diagnosis not present

## 2019-11-05 DIAGNOSIS — D631 Anemia in chronic kidney disease: Secondary | ICD-10-CM | POA: Diagnosis not present

## 2019-11-05 DIAGNOSIS — Z5181 Encounter for therapeutic drug level monitoring: Secondary | ICD-10-CM | POA: Diagnosis not present

## 2019-11-05 DIAGNOSIS — N390 Urinary tract infection, site not specified: Secondary | ICD-10-CM | POA: Diagnosis not present

## 2019-11-05 DIAGNOSIS — I482 Chronic atrial fibrillation, unspecified: Secondary | ICD-10-CM | POA: Diagnosis not present

## 2019-11-05 DIAGNOSIS — D688 Other specified coagulation defects: Secondary | ICD-10-CM | POA: Diagnosis not present

## 2019-11-07 DIAGNOSIS — D688 Other specified coagulation defects: Secondary | ICD-10-CM | POA: Diagnosis not present

## 2019-11-07 DIAGNOSIS — I482 Chronic atrial fibrillation, unspecified: Secondary | ICD-10-CM | POA: Diagnosis not present

## 2019-11-07 DIAGNOSIS — R197 Diarrhea, unspecified: Secondary | ICD-10-CM | POA: Diagnosis not present

## 2019-11-07 DIAGNOSIS — Z5181 Encounter for therapeutic drug level monitoring: Secondary | ICD-10-CM | POA: Diagnosis not present

## 2019-11-07 DIAGNOSIS — N186 End stage renal disease: Secondary | ICD-10-CM | POA: Diagnosis not present

## 2019-11-07 DIAGNOSIS — Z992 Dependence on renal dialysis: Secondary | ICD-10-CM | POA: Diagnosis not present

## 2019-11-07 DIAGNOSIS — N390 Urinary tract infection, site not specified: Secondary | ICD-10-CM | POA: Diagnosis not present

## 2019-11-07 DIAGNOSIS — D509 Iron deficiency anemia, unspecified: Secondary | ICD-10-CM | POA: Diagnosis not present

## 2019-11-07 DIAGNOSIS — N2581 Secondary hyperparathyroidism of renal origin: Secondary | ICD-10-CM | POA: Diagnosis not present

## 2019-11-07 DIAGNOSIS — R519 Headache, unspecified: Secondary | ICD-10-CM | POA: Diagnosis not present

## 2019-11-07 DIAGNOSIS — E039 Hypothyroidism, unspecified: Secondary | ICD-10-CM | POA: Diagnosis not present

## 2019-11-07 DIAGNOSIS — M109 Gout, unspecified: Secondary | ICD-10-CM | POA: Diagnosis not present

## 2019-11-07 DIAGNOSIS — D631 Anemia in chronic kidney disease: Secondary | ICD-10-CM | POA: Diagnosis not present

## 2019-11-09 DIAGNOSIS — N390 Urinary tract infection, site not specified: Secondary | ICD-10-CM | POA: Diagnosis not present

## 2019-11-09 DIAGNOSIS — R197 Diarrhea, unspecified: Secondary | ICD-10-CM | POA: Diagnosis not present

## 2019-11-09 DIAGNOSIS — Z992 Dependence on renal dialysis: Secondary | ICD-10-CM | POA: Diagnosis not present

## 2019-11-09 DIAGNOSIS — D688 Other specified coagulation defects: Secondary | ICD-10-CM | POA: Diagnosis not present

## 2019-11-09 DIAGNOSIS — I482 Chronic atrial fibrillation, unspecified: Secondary | ICD-10-CM | POA: Diagnosis not present

## 2019-11-09 DIAGNOSIS — D509 Iron deficiency anemia, unspecified: Secondary | ICD-10-CM | POA: Diagnosis not present

## 2019-11-09 DIAGNOSIS — E039 Hypothyroidism, unspecified: Secondary | ICD-10-CM | POA: Diagnosis not present

## 2019-11-09 DIAGNOSIS — N186 End stage renal disease: Secondary | ICD-10-CM | POA: Diagnosis not present

## 2019-11-09 DIAGNOSIS — Z5181 Encounter for therapeutic drug level monitoring: Secondary | ICD-10-CM | POA: Diagnosis not present

## 2019-11-09 DIAGNOSIS — N2581 Secondary hyperparathyroidism of renal origin: Secondary | ICD-10-CM | POA: Diagnosis not present

## 2019-11-09 DIAGNOSIS — D631 Anemia in chronic kidney disease: Secondary | ICD-10-CM | POA: Diagnosis not present

## 2019-11-09 DIAGNOSIS — M109 Gout, unspecified: Secondary | ICD-10-CM | POA: Diagnosis not present

## 2019-11-09 DIAGNOSIS — R519 Headache, unspecified: Secondary | ICD-10-CM | POA: Diagnosis not present

## 2019-11-12 DIAGNOSIS — R197 Diarrhea, unspecified: Secondary | ICD-10-CM | POA: Diagnosis not present

## 2019-11-12 DIAGNOSIS — N186 End stage renal disease: Secondary | ICD-10-CM | POA: Diagnosis not present

## 2019-11-12 DIAGNOSIS — E039 Hypothyroidism, unspecified: Secondary | ICD-10-CM | POA: Diagnosis not present

## 2019-11-12 DIAGNOSIS — M109 Gout, unspecified: Secondary | ICD-10-CM | POA: Diagnosis not present

## 2019-11-12 DIAGNOSIS — D631 Anemia in chronic kidney disease: Secondary | ICD-10-CM | POA: Diagnosis not present

## 2019-11-12 DIAGNOSIS — R519 Headache, unspecified: Secondary | ICD-10-CM | POA: Diagnosis not present

## 2019-11-12 DIAGNOSIS — D688 Other specified coagulation defects: Secondary | ICD-10-CM | POA: Diagnosis not present

## 2019-11-12 DIAGNOSIS — Z992 Dependence on renal dialysis: Secondary | ICD-10-CM | POA: Diagnosis not present

## 2019-11-12 DIAGNOSIS — N2581 Secondary hyperparathyroidism of renal origin: Secondary | ICD-10-CM | POA: Diagnosis not present

## 2019-11-12 DIAGNOSIS — Z5181 Encounter for therapeutic drug level monitoring: Secondary | ICD-10-CM | POA: Diagnosis not present

## 2019-11-12 DIAGNOSIS — D509 Iron deficiency anemia, unspecified: Secondary | ICD-10-CM | POA: Diagnosis not present

## 2019-11-12 DIAGNOSIS — I482 Chronic atrial fibrillation, unspecified: Secondary | ICD-10-CM | POA: Diagnosis not present

## 2019-11-12 DIAGNOSIS — N390 Urinary tract infection, site not specified: Secondary | ICD-10-CM | POA: Diagnosis not present

## 2019-11-13 ENCOUNTER — Encounter (HOSPITAL_COMMUNITY): Payer: Self-pay

## 2019-11-13 ENCOUNTER — Emergency Department (HOSPITAL_COMMUNITY)
Admission: EM | Admit: 2019-11-13 | Discharge: 2019-11-14 | Disposition: A | Discharge status: Home / Self Care | Payer: Medicare Other | Attending: Emergency Medicine | Admitting: Emergency Medicine

## 2019-11-13 ENCOUNTER — Other Ambulatory Visit: Payer: Self-pay

## 2019-11-13 DIAGNOSIS — M25572 Pain in left ankle and joints of left foot: Secondary | ICD-10-CM | POA: Diagnosis not present

## 2019-11-13 DIAGNOSIS — R1031 Right lower quadrant pain: Secondary | ICD-10-CM | POA: Insufficient documentation

## 2019-11-13 DIAGNOSIS — Z87442 Personal history of urinary calculi: Secondary | ICD-10-CM | POA: Insufficient documentation

## 2019-11-13 DIAGNOSIS — R2242 Localized swelling, mass and lump, left lower limb: Secondary | ICD-10-CM | POA: Diagnosis not present

## 2019-11-13 DIAGNOSIS — M1712 Unilateral primary osteoarthritis, left knee: Secondary | ICD-10-CM | POA: Diagnosis not present

## 2019-11-13 DIAGNOSIS — Z5321 Procedure and treatment not carried out due to patient leaving prior to being seen by health care provider: Secondary | ICD-10-CM | POA: Diagnosis not present

## 2019-11-13 DIAGNOSIS — R319 Hematuria, unspecified: Secondary | ICD-10-CM | POA: Diagnosis not present

## 2019-11-13 DIAGNOSIS — Z96652 Presence of left artificial knee joint: Secondary | ICD-10-CM | POA: Diagnosis not present

## 2019-11-13 LAB — CBC
HCT: 29.1 % — ABNORMAL LOW (ref 36.0–46.0)
Hemoglobin: 9.3 g/dL — ABNORMAL LOW (ref 12.0–15.0)
MCH: 30.1 pg (ref 26.0–34.0)
MCHC: 32 g/dL (ref 30.0–36.0)
MCV: 94.2 fL (ref 80.0–100.0)
Platelets: 320 10*3/uL (ref 150–400)
RBC: 3.09 MIL/uL — ABNORMAL LOW (ref 3.87–5.11)
RDW: 18.5 % — ABNORMAL HIGH (ref 11.5–15.5)
WBC: 11.1 10*3/uL — ABNORMAL HIGH (ref 4.0–10.5)
nRBC: 0 % (ref 0.0–0.2)

## 2019-11-13 LAB — BASIC METABOLIC PANEL
Anion gap: 10 (ref 5–15)
BUN: 23 mg/dL (ref 8–23)
CO2: 32 mmol/L (ref 22–32)
Calcium: 8.4 mg/dL — ABNORMAL LOW (ref 8.9–10.3)
Chloride: 92 mmol/L — ABNORMAL LOW (ref 98–111)
Creatinine, Ser: 5.38 mg/dL — ABNORMAL HIGH (ref 0.44–1.00)
GFR calc Af Amer: 8 mL/min — ABNORMAL LOW (ref >60)
GFR calc non Af Amer: 7 mL/min — ABNORMAL LOW (ref >60)
Glucose, Bld: 104 mg/dL — ABNORMAL HIGH (ref 70–99)
Potassium: 3.3 mmol/L — ABNORMAL LOW (ref 3.5–5.1)
Sodium: 134 mmol/L — ABNORMAL LOW (ref 135–145)

## 2019-11-13 NOTE — ED Triage Notes (Signed)
Patient complains of right flank pain with hematuria and reports decreased urination. Also complains of ongoing left foot swelling, hx of stones

## 2019-11-14 DIAGNOSIS — N2581 Secondary hyperparathyroidism of renal origin: Secondary | ICD-10-CM | POA: Diagnosis not present

## 2019-11-14 DIAGNOSIS — I482 Chronic atrial fibrillation, unspecified: Secondary | ICD-10-CM | POA: Diagnosis not present

## 2019-11-14 DIAGNOSIS — M109 Gout, unspecified: Secondary | ICD-10-CM | POA: Insufficient documentation

## 2019-11-14 DIAGNOSIS — N186 End stage renal disease: Secondary | ICD-10-CM | POA: Diagnosis not present

## 2019-11-14 DIAGNOSIS — E039 Hypothyroidism, unspecified: Secondary | ICD-10-CM | POA: Diagnosis not present

## 2019-11-14 DIAGNOSIS — N390 Urinary tract infection, site not specified: Secondary | ICD-10-CM | POA: Diagnosis not present

## 2019-11-14 DIAGNOSIS — R197 Diarrhea, unspecified: Secondary | ICD-10-CM | POA: Diagnosis not present

## 2019-11-14 DIAGNOSIS — D688 Other specified coagulation defects: Secondary | ICD-10-CM | POA: Diagnosis not present

## 2019-11-14 DIAGNOSIS — R519 Headache, unspecified: Secondary | ICD-10-CM | POA: Diagnosis not present

## 2019-11-14 DIAGNOSIS — D631 Anemia in chronic kidney disease: Secondary | ICD-10-CM | POA: Diagnosis not present

## 2019-11-14 DIAGNOSIS — D509 Iron deficiency anemia, unspecified: Secondary | ICD-10-CM | POA: Diagnosis not present

## 2019-11-14 DIAGNOSIS — Z992 Dependence on renal dialysis: Secondary | ICD-10-CM | POA: Diagnosis not present

## 2019-11-14 DIAGNOSIS — Z5181 Encounter for therapeutic drug level monitoring: Secondary | ICD-10-CM | POA: Diagnosis not present

## 2019-11-14 NOTE — ED Notes (Signed)
Pt states she is going to keep her doctors appointment in the morning and leave she has been here since 4 and is tired.

## 2019-11-15 ENCOUNTER — Other Ambulatory Visit: Payer: Self-pay

## 2019-11-15 ENCOUNTER — Ambulatory Visit (INDEPENDENT_AMBULATORY_CARE_PROVIDER_SITE_OTHER): Payer: Medicare Other | Admitting: Pharmacist

## 2019-11-15 DIAGNOSIS — I4891 Unspecified atrial fibrillation: Secondary | ICD-10-CM

## 2019-11-15 DIAGNOSIS — M1712 Unilateral primary osteoarthritis, left knee: Secondary | ICD-10-CM

## 2019-11-15 DIAGNOSIS — Z5181 Encounter for therapeutic drug level monitoring: Secondary | ICD-10-CM

## 2019-11-15 DIAGNOSIS — I480 Paroxysmal atrial fibrillation: Secondary | ICD-10-CM | POA: Diagnosis not present

## 2019-11-15 LAB — POCT INR: INR: 8 — AB (ref 2.0–3.0)

## 2019-11-15 LAB — PROTIME-INR
INR: 8.7 (ref 0.9–1.2)
Prothrombin Time: 91.7 s — ABNORMAL HIGH (ref 9.1–12.0)

## 2019-11-15 NOTE — Patient Instructions (Addendum)
Description   Stat INR back at 8.7. Advised pt to skip her warfarin for 5 days and eat extra greens. Go to ER with any bleeding. Will recheck INR next Wednesday. Normal dose is 1/2 tablet (2.5mg ) daily except for 1 tablet (5mg ) on Thursday.   Call with any new medications or if scheduled for any procedures or with any questions [phone]

## 2019-11-16 DIAGNOSIS — Z992 Dependence on renal dialysis: Secondary | ICD-10-CM | POA: Diagnosis not present

## 2019-11-16 DIAGNOSIS — M109 Gout, unspecified: Secondary | ICD-10-CM | POA: Diagnosis not present

## 2019-11-16 DIAGNOSIS — I482 Chronic atrial fibrillation, unspecified: Secondary | ICD-10-CM | POA: Diagnosis not present

## 2019-11-16 DIAGNOSIS — D509 Iron deficiency anemia, unspecified: Secondary | ICD-10-CM | POA: Diagnosis not present

## 2019-11-16 DIAGNOSIS — R197 Diarrhea, unspecified: Secondary | ICD-10-CM | POA: Diagnosis not present

## 2019-11-16 DIAGNOSIS — D688 Other specified coagulation defects: Secondary | ICD-10-CM | POA: Diagnosis not present

## 2019-11-16 DIAGNOSIS — R519 Headache, unspecified: Secondary | ICD-10-CM | POA: Diagnosis not present

## 2019-11-16 DIAGNOSIS — E039 Hypothyroidism, unspecified: Secondary | ICD-10-CM | POA: Diagnosis not present

## 2019-11-16 DIAGNOSIS — N390 Urinary tract infection, site not specified: Secondary | ICD-10-CM | POA: Diagnosis not present

## 2019-11-16 DIAGNOSIS — Z5181 Encounter for therapeutic drug level monitoring: Secondary | ICD-10-CM | POA: Diagnosis not present

## 2019-11-16 DIAGNOSIS — N2581 Secondary hyperparathyroidism of renal origin: Secondary | ICD-10-CM | POA: Diagnosis not present

## 2019-11-16 DIAGNOSIS — D631 Anemia in chronic kidney disease: Secondary | ICD-10-CM | POA: Diagnosis not present

## 2019-11-16 DIAGNOSIS — N186 End stage renal disease: Secondary | ICD-10-CM | POA: Diagnosis not present

## 2019-11-18 DIAGNOSIS — I129 Hypertensive chronic kidney disease with stage 1 through stage 4 chronic kidney disease, or unspecified chronic kidney disease: Secondary | ICD-10-CM | POA: Diagnosis not present

## 2019-11-18 DIAGNOSIS — N186 End stage renal disease: Secondary | ICD-10-CM | POA: Diagnosis not present

## 2019-11-18 DIAGNOSIS — Z992 Dependence on renal dialysis: Secondary | ICD-10-CM | POA: Diagnosis not present

## 2019-11-19 DIAGNOSIS — N186 End stage renal disease: Secondary | ICD-10-CM | POA: Diagnosis not present

## 2019-11-19 DIAGNOSIS — R197 Diarrhea, unspecified: Secondary | ICD-10-CM | POA: Diagnosis not present

## 2019-11-19 DIAGNOSIS — D688 Other specified coagulation defects: Secondary | ICD-10-CM | POA: Diagnosis not present

## 2019-11-19 DIAGNOSIS — E039 Hypothyroidism, unspecified: Secondary | ICD-10-CM | POA: Diagnosis not present

## 2019-11-19 DIAGNOSIS — I482 Chronic atrial fibrillation, unspecified: Secondary | ICD-10-CM | POA: Diagnosis not present

## 2019-11-19 DIAGNOSIS — Z992 Dependence on renal dialysis: Secondary | ICD-10-CM | POA: Diagnosis not present

## 2019-11-19 DIAGNOSIS — D631 Anemia in chronic kidney disease: Secondary | ICD-10-CM | POA: Diagnosis not present

## 2019-11-19 DIAGNOSIS — Z5181 Encounter for therapeutic drug level monitoring: Secondary | ICD-10-CM | POA: Diagnosis not present

## 2019-11-19 DIAGNOSIS — R519 Headache, unspecified: Secondary | ICD-10-CM | POA: Diagnosis not present

## 2019-11-19 DIAGNOSIS — D509 Iron deficiency anemia, unspecified: Secondary | ICD-10-CM | POA: Diagnosis not present

## 2019-11-19 DIAGNOSIS — N2581 Secondary hyperparathyroidism of renal origin: Secondary | ICD-10-CM | POA: Diagnosis not present

## 2019-11-20 ENCOUNTER — Other Ambulatory Visit: Payer: Self-pay

## 2019-11-20 ENCOUNTER — Ambulatory Visit (INDEPENDENT_AMBULATORY_CARE_PROVIDER_SITE_OTHER): Payer: Medicare Other | Admitting: Pharmacist

## 2019-11-20 DIAGNOSIS — I480 Paroxysmal atrial fibrillation: Secondary | ICD-10-CM

## 2019-11-20 DIAGNOSIS — I4891 Unspecified atrial fibrillation: Secondary | ICD-10-CM

## 2019-11-20 DIAGNOSIS — Z5181 Encounter for therapeutic drug level monitoring: Secondary | ICD-10-CM | POA: Diagnosis not present

## 2019-11-20 DIAGNOSIS — M1712 Unilateral primary osteoarthritis, left knee: Secondary | ICD-10-CM

## 2019-11-20 LAB — POCT INR: INR: 1.3 — AB (ref 2.0–3.0)

## 2019-11-20 NOTE — Patient Instructions (Signed)
Description   Take 1 tablet today and 1.5 tablets tomorrow, then resume normal dose 1/2 tablet (2.5mg ) daily except for 1 tablet (5mg ) on Thursday.  Recheck INR in 1 week. Call with any new medications or if scheduled for any procedures or with any questions [phone]

## 2019-11-21 DIAGNOSIS — R519 Headache, unspecified: Secondary | ICD-10-CM | POA: Diagnosis not present

## 2019-11-21 DIAGNOSIS — R197 Diarrhea, unspecified: Secondary | ICD-10-CM | POA: Diagnosis not present

## 2019-11-21 DIAGNOSIS — N186 End stage renal disease: Secondary | ICD-10-CM | POA: Diagnosis not present

## 2019-11-21 DIAGNOSIS — D509 Iron deficiency anemia, unspecified: Secondary | ICD-10-CM | POA: Diagnosis not present

## 2019-11-21 DIAGNOSIS — D631 Anemia in chronic kidney disease: Secondary | ICD-10-CM | POA: Diagnosis not present

## 2019-11-21 DIAGNOSIS — N2581 Secondary hyperparathyroidism of renal origin: Secondary | ICD-10-CM | POA: Diagnosis not present

## 2019-11-21 DIAGNOSIS — I482 Chronic atrial fibrillation, unspecified: Secondary | ICD-10-CM | POA: Diagnosis not present

## 2019-11-21 DIAGNOSIS — Z992 Dependence on renal dialysis: Secondary | ICD-10-CM | POA: Diagnosis not present

## 2019-11-21 DIAGNOSIS — Z5181 Encounter for therapeutic drug level monitoring: Secondary | ICD-10-CM | POA: Diagnosis not present

## 2019-11-21 DIAGNOSIS — D688 Other specified coagulation defects: Secondary | ICD-10-CM | POA: Diagnosis not present

## 2019-11-21 DIAGNOSIS — E039 Hypothyroidism, unspecified: Secondary | ICD-10-CM | POA: Diagnosis not present

## 2019-11-23 DIAGNOSIS — Z5181 Encounter for therapeutic drug level monitoring: Secondary | ICD-10-CM | POA: Diagnosis not present

## 2019-11-23 DIAGNOSIS — Z992 Dependence on renal dialysis: Secondary | ICD-10-CM | POA: Diagnosis not present

## 2019-11-23 DIAGNOSIS — R197 Diarrhea, unspecified: Secondary | ICD-10-CM | POA: Diagnosis not present

## 2019-11-23 DIAGNOSIS — D509 Iron deficiency anemia, unspecified: Secondary | ICD-10-CM | POA: Diagnosis not present

## 2019-11-23 DIAGNOSIS — E039 Hypothyroidism, unspecified: Secondary | ICD-10-CM | POA: Diagnosis not present

## 2019-11-23 DIAGNOSIS — R519 Headache, unspecified: Secondary | ICD-10-CM | POA: Diagnosis not present

## 2019-11-23 DIAGNOSIS — N2581 Secondary hyperparathyroidism of renal origin: Secondary | ICD-10-CM | POA: Diagnosis not present

## 2019-11-23 DIAGNOSIS — D631 Anemia in chronic kidney disease: Secondary | ICD-10-CM | POA: Diagnosis not present

## 2019-11-23 DIAGNOSIS — N186 End stage renal disease: Secondary | ICD-10-CM | POA: Diagnosis not present

## 2019-11-23 DIAGNOSIS — D688 Other specified coagulation defects: Secondary | ICD-10-CM | POA: Diagnosis not present

## 2019-11-23 DIAGNOSIS — I482 Chronic atrial fibrillation, unspecified: Secondary | ICD-10-CM | POA: Diagnosis not present

## 2019-11-26 DIAGNOSIS — R519 Headache, unspecified: Secondary | ICD-10-CM | POA: Diagnosis not present

## 2019-11-26 DIAGNOSIS — D688 Other specified coagulation defects: Secondary | ICD-10-CM | POA: Diagnosis not present

## 2019-11-26 DIAGNOSIS — N2581 Secondary hyperparathyroidism of renal origin: Secondary | ICD-10-CM | POA: Diagnosis not present

## 2019-11-26 DIAGNOSIS — N186 End stage renal disease: Secondary | ICD-10-CM | POA: Diagnosis not present

## 2019-11-26 DIAGNOSIS — D509 Iron deficiency anemia, unspecified: Secondary | ICD-10-CM | POA: Diagnosis not present

## 2019-11-26 DIAGNOSIS — D631 Anemia in chronic kidney disease: Secondary | ICD-10-CM | POA: Diagnosis not present

## 2019-11-26 DIAGNOSIS — I482 Chronic atrial fibrillation, unspecified: Secondary | ICD-10-CM | POA: Diagnosis not present

## 2019-11-26 DIAGNOSIS — Z992 Dependence on renal dialysis: Secondary | ICD-10-CM | POA: Diagnosis not present

## 2019-11-26 DIAGNOSIS — R197 Diarrhea, unspecified: Secondary | ICD-10-CM | POA: Diagnosis not present

## 2019-11-26 DIAGNOSIS — Z5181 Encounter for therapeutic drug level monitoring: Secondary | ICD-10-CM | POA: Diagnosis not present

## 2019-11-26 DIAGNOSIS — E039 Hypothyroidism, unspecified: Secondary | ICD-10-CM | POA: Diagnosis not present

## 2019-11-28 DIAGNOSIS — N186 End stage renal disease: Secondary | ICD-10-CM | POA: Diagnosis not present

## 2019-11-28 DIAGNOSIS — E039 Hypothyroidism, unspecified: Secondary | ICD-10-CM | POA: Diagnosis not present

## 2019-11-28 DIAGNOSIS — N2581 Secondary hyperparathyroidism of renal origin: Secondary | ICD-10-CM | POA: Diagnosis not present

## 2019-11-28 DIAGNOSIS — D631 Anemia in chronic kidney disease: Secondary | ICD-10-CM | POA: Diagnosis not present

## 2019-11-28 DIAGNOSIS — I482 Chronic atrial fibrillation, unspecified: Secondary | ICD-10-CM | POA: Diagnosis not present

## 2019-11-28 DIAGNOSIS — D509 Iron deficiency anemia, unspecified: Secondary | ICD-10-CM | POA: Diagnosis not present

## 2019-11-28 DIAGNOSIS — D688 Other specified coagulation defects: Secondary | ICD-10-CM | POA: Diagnosis not present

## 2019-11-28 DIAGNOSIS — R519 Headache, unspecified: Secondary | ICD-10-CM | POA: Diagnosis not present

## 2019-11-28 DIAGNOSIS — Z5181 Encounter for therapeutic drug level monitoring: Secondary | ICD-10-CM | POA: Diagnosis not present

## 2019-11-28 DIAGNOSIS — R197 Diarrhea, unspecified: Secondary | ICD-10-CM | POA: Diagnosis not present

## 2019-11-28 DIAGNOSIS — Z992 Dependence on renal dialysis: Secondary | ICD-10-CM | POA: Diagnosis not present

## 2019-11-29 ENCOUNTER — Ambulatory Visit (INDEPENDENT_AMBULATORY_CARE_PROVIDER_SITE_OTHER): Payer: Medicare Other | Admitting: *Deleted

## 2019-11-29 ENCOUNTER — Other Ambulatory Visit: Payer: Self-pay

## 2019-11-29 DIAGNOSIS — I4891 Unspecified atrial fibrillation: Secondary | ICD-10-CM | POA: Diagnosis not present

## 2019-11-29 DIAGNOSIS — Z5181 Encounter for therapeutic drug level monitoring: Secondary | ICD-10-CM

## 2019-11-29 DIAGNOSIS — I480 Paroxysmal atrial fibrillation: Secondary | ICD-10-CM | POA: Diagnosis not present

## 2019-11-29 LAB — POCT INR: INR: 2.2 (ref 2.0–3.0)

## 2019-11-29 NOTE — Patient Instructions (Signed)
Description   Continue normal dose 1/2 tablet (2.5mg ) daily except for 1 tablet (5mg ) on Thursday.  Recheck INR in 2 week. Call with any new medications or if scheduled for any procedures or with any questions [phone]

## 2019-11-30 DIAGNOSIS — D509 Iron deficiency anemia, unspecified: Secondary | ICD-10-CM | POA: Diagnosis not present

## 2019-11-30 DIAGNOSIS — N2581 Secondary hyperparathyroidism of renal origin: Secondary | ICD-10-CM | POA: Diagnosis not present

## 2019-11-30 DIAGNOSIS — Z992 Dependence on renal dialysis: Secondary | ICD-10-CM | POA: Diagnosis not present

## 2019-11-30 DIAGNOSIS — R197 Diarrhea, unspecified: Secondary | ICD-10-CM | POA: Diagnosis not present

## 2019-11-30 DIAGNOSIS — I482 Chronic atrial fibrillation, unspecified: Secondary | ICD-10-CM | POA: Diagnosis not present

## 2019-11-30 DIAGNOSIS — E039 Hypothyroidism, unspecified: Secondary | ICD-10-CM | POA: Diagnosis not present

## 2019-11-30 DIAGNOSIS — R519 Headache, unspecified: Secondary | ICD-10-CM | POA: Diagnosis not present

## 2019-11-30 DIAGNOSIS — Z5181 Encounter for therapeutic drug level monitoring: Secondary | ICD-10-CM | POA: Diagnosis not present

## 2019-11-30 DIAGNOSIS — D631 Anemia in chronic kidney disease: Secondary | ICD-10-CM | POA: Diagnosis not present

## 2019-11-30 DIAGNOSIS — D688 Other specified coagulation defects: Secondary | ICD-10-CM | POA: Diagnosis not present

## 2019-11-30 DIAGNOSIS — N186 End stage renal disease: Secondary | ICD-10-CM | POA: Diagnosis not present

## 2019-12-02 DIAGNOSIS — M6281 Muscle weakness (generalized): Secondary | ICD-10-CM | POA: Diagnosis not present

## 2019-12-02 DIAGNOSIS — R262 Difficulty in walking, not elsewhere classified: Secondary | ICD-10-CM | POA: Diagnosis not present

## 2019-12-02 DIAGNOSIS — M25662 Stiffness of left knee, not elsewhere classified: Secondary | ICD-10-CM | POA: Diagnosis not present

## 2019-12-03 DIAGNOSIS — N2581 Secondary hyperparathyroidism of renal origin: Secondary | ICD-10-CM | POA: Diagnosis not present

## 2019-12-03 DIAGNOSIS — I482 Chronic atrial fibrillation, unspecified: Secondary | ICD-10-CM | POA: Diagnosis not present

## 2019-12-03 DIAGNOSIS — N186 End stage renal disease: Secondary | ICD-10-CM | POA: Diagnosis not present

## 2019-12-03 DIAGNOSIS — R519 Headache, unspecified: Secondary | ICD-10-CM | POA: Diagnosis not present

## 2019-12-03 DIAGNOSIS — D509 Iron deficiency anemia, unspecified: Secondary | ICD-10-CM | POA: Diagnosis not present

## 2019-12-03 DIAGNOSIS — R197 Diarrhea, unspecified: Secondary | ICD-10-CM | POA: Diagnosis not present

## 2019-12-03 DIAGNOSIS — Z992 Dependence on renal dialysis: Secondary | ICD-10-CM | POA: Diagnosis not present

## 2019-12-03 DIAGNOSIS — Z5181 Encounter for therapeutic drug level monitoring: Secondary | ICD-10-CM | POA: Diagnosis not present

## 2019-12-03 DIAGNOSIS — D688 Other specified coagulation defects: Secondary | ICD-10-CM | POA: Diagnosis not present

## 2019-12-03 DIAGNOSIS — E039 Hypothyroidism, unspecified: Secondary | ICD-10-CM | POA: Diagnosis not present

## 2019-12-03 DIAGNOSIS — D631 Anemia in chronic kidney disease: Secondary | ICD-10-CM | POA: Diagnosis not present

## 2019-12-05 DIAGNOSIS — R197 Diarrhea, unspecified: Secondary | ICD-10-CM | POA: Diagnosis not present

## 2019-12-05 DIAGNOSIS — Z5181 Encounter for therapeutic drug level monitoring: Secondary | ICD-10-CM | POA: Diagnosis not present

## 2019-12-05 DIAGNOSIS — N186 End stage renal disease: Secondary | ICD-10-CM | POA: Diagnosis not present

## 2019-12-05 DIAGNOSIS — Z992 Dependence on renal dialysis: Secondary | ICD-10-CM | POA: Diagnosis not present

## 2019-12-05 DIAGNOSIS — I482 Chronic atrial fibrillation, unspecified: Secondary | ICD-10-CM | POA: Diagnosis not present

## 2019-12-05 DIAGNOSIS — D631 Anemia in chronic kidney disease: Secondary | ICD-10-CM | POA: Diagnosis not present

## 2019-12-05 DIAGNOSIS — D509 Iron deficiency anemia, unspecified: Secondary | ICD-10-CM | POA: Diagnosis not present

## 2019-12-05 DIAGNOSIS — E039 Hypothyroidism, unspecified: Secondary | ICD-10-CM | POA: Diagnosis not present

## 2019-12-05 DIAGNOSIS — R519 Headache, unspecified: Secondary | ICD-10-CM | POA: Diagnosis not present

## 2019-12-05 DIAGNOSIS — N2581 Secondary hyperparathyroidism of renal origin: Secondary | ICD-10-CM | POA: Diagnosis not present

## 2019-12-05 DIAGNOSIS — D688 Other specified coagulation defects: Secondary | ICD-10-CM | POA: Diagnosis not present

## 2019-12-06 DIAGNOSIS — M25662 Stiffness of left knee, not elsewhere classified: Secondary | ICD-10-CM | POA: Diagnosis not present

## 2019-12-06 DIAGNOSIS — M6281 Muscle weakness (generalized): Secondary | ICD-10-CM | POA: Diagnosis not present

## 2019-12-06 DIAGNOSIS — R262 Difficulty in walking, not elsewhere classified: Secondary | ICD-10-CM | POA: Diagnosis not present

## 2019-12-07 DIAGNOSIS — R519 Headache, unspecified: Secondary | ICD-10-CM | POA: Diagnosis not present

## 2019-12-07 DIAGNOSIS — D631 Anemia in chronic kidney disease: Secondary | ICD-10-CM | POA: Diagnosis not present

## 2019-12-07 DIAGNOSIS — Z992 Dependence on renal dialysis: Secondary | ICD-10-CM | POA: Diagnosis not present

## 2019-12-07 DIAGNOSIS — D688 Other specified coagulation defects: Secondary | ICD-10-CM | POA: Diagnosis not present

## 2019-12-07 DIAGNOSIS — E039 Hypothyroidism, unspecified: Secondary | ICD-10-CM | POA: Diagnosis not present

## 2019-12-07 DIAGNOSIS — I482 Chronic atrial fibrillation, unspecified: Secondary | ICD-10-CM | POA: Diagnosis not present

## 2019-12-07 DIAGNOSIS — D509 Iron deficiency anemia, unspecified: Secondary | ICD-10-CM | POA: Diagnosis not present

## 2019-12-07 DIAGNOSIS — N2581 Secondary hyperparathyroidism of renal origin: Secondary | ICD-10-CM | POA: Diagnosis not present

## 2019-12-07 DIAGNOSIS — R197 Diarrhea, unspecified: Secondary | ICD-10-CM | POA: Diagnosis not present

## 2019-12-07 DIAGNOSIS — N186 End stage renal disease: Secondary | ICD-10-CM | POA: Diagnosis not present

## 2019-12-07 DIAGNOSIS — Z5181 Encounter for therapeutic drug level monitoring: Secondary | ICD-10-CM | POA: Diagnosis not present

## 2019-12-09 DIAGNOSIS — M6281 Muscle weakness (generalized): Secondary | ICD-10-CM | POA: Diagnosis not present

## 2019-12-09 DIAGNOSIS — M25662 Stiffness of left knee, not elsewhere classified: Secondary | ICD-10-CM | POA: Diagnosis not present

## 2019-12-09 DIAGNOSIS — R262 Difficulty in walking, not elsewhere classified: Secondary | ICD-10-CM | POA: Diagnosis not present

## 2019-12-10 DIAGNOSIS — Z5181 Encounter for therapeutic drug level monitoring: Secondary | ICD-10-CM | POA: Diagnosis not present

## 2019-12-10 DIAGNOSIS — D688 Other specified coagulation defects: Secondary | ICD-10-CM | POA: Diagnosis not present

## 2019-12-10 DIAGNOSIS — D631 Anemia in chronic kidney disease: Secondary | ICD-10-CM | POA: Diagnosis not present

## 2019-12-10 DIAGNOSIS — N186 End stage renal disease: Secondary | ICD-10-CM | POA: Diagnosis not present

## 2019-12-10 DIAGNOSIS — N2581 Secondary hyperparathyroidism of renal origin: Secondary | ICD-10-CM | POA: Diagnosis not present

## 2019-12-10 DIAGNOSIS — D509 Iron deficiency anemia, unspecified: Secondary | ICD-10-CM | POA: Diagnosis not present

## 2019-12-10 DIAGNOSIS — E039 Hypothyroidism, unspecified: Secondary | ICD-10-CM | POA: Diagnosis not present

## 2019-12-10 DIAGNOSIS — R519 Headache, unspecified: Secondary | ICD-10-CM | POA: Diagnosis not present

## 2019-12-10 DIAGNOSIS — I482 Chronic atrial fibrillation, unspecified: Secondary | ICD-10-CM | POA: Diagnosis not present

## 2019-12-10 DIAGNOSIS — Z992 Dependence on renal dialysis: Secondary | ICD-10-CM | POA: Diagnosis not present

## 2019-12-10 DIAGNOSIS — R197 Diarrhea, unspecified: Secondary | ICD-10-CM | POA: Diagnosis not present

## 2019-12-11 ENCOUNTER — Other Ambulatory Visit: Payer: Self-pay

## 2019-12-11 ENCOUNTER — Ambulatory Visit (INDEPENDENT_AMBULATORY_CARE_PROVIDER_SITE_OTHER): Payer: Medicare Other

## 2019-12-11 DIAGNOSIS — I4891 Unspecified atrial fibrillation: Secondary | ICD-10-CM

## 2019-12-11 DIAGNOSIS — Z5181 Encounter for therapeutic drug level monitoring: Secondary | ICD-10-CM | POA: Diagnosis not present

## 2019-12-11 DIAGNOSIS — I480 Paroxysmal atrial fibrillation: Secondary | ICD-10-CM | POA: Diagnosis not present

## 2019-12-11 DIAGNOSIS — M1712 Unilateral primary osteoarthritis, left knee: Secondary | ICD-10-CM

## 2019-12-11 LAB — POCT INR: INR: 2 (ref 2.0–3.0)

## 2019-12-11 NOTE — Patient Instructions (Signed)
Continue normal dose 1/2 tablet (2.5mg ) daily except for 1 tablet (5mg ) on Thursday.  Recheck INR in 3 weeks. Call with any new medications or if scheduled for any procedures or with any questions [phone]

## 2019-12-12 DIAGNOSIS — R197 Diarrhea, unspecified: Secondary | ICD-10-CM | POA: Diagnosis not present

## 2019-12-12 DIAGNOSIS — Z5181 Encounter for therapeutic drug level monitoring: Secondary | ICD-10-CM | POA: Diagnosis not present

## 2019-12-12 DIAGNOSIS — N2581 Secondary hyperparathyroidism of renal origin: Secondary | ICD-10-CM | POA: Diagnosis not present

## 2019-12-12 DIAGNOSIS — N186 End stage renal disease: Secondary | ICD-10-CM | POA: Diagnosis not present

## 2019-12-12 DIAGNOSIS — D631 Anemia in chronic kidney disease: Secondary | ICD-10-CM | POA: Diagnosis not present

## 2019-12-12 DIAGNOSIS — Z992 Dependence on renal dialysis: Secondary | ICD-10-CM | POA: Diagnosis not present

## 2019-12-12 DIAGNOSIS — I482 Chronic atrial fibrillation, unspecified: Secondary | ICD-10-CM | POA: Diagnosis not present

## 2019-12-12 DIAGNOSIS — D509 Iron deficiency anemia, unspecified: Secondary | ICD-10-CM | POA: Diagnosis not present

## 2019-12-12 DIAGNOSIS — D688 Other specified coagulation defects: Secondary | ICD-10-CM | POA: Diagnosis not present

## 2019-12-12 DIAGNOSIS — E039 Hypothyroidism, unspecified: Secondary | ICD-10-CM | POA: Diagnosis not present

## 2019-12-12 DIAGNOSIS — R519 Headache, unspecified: Secondary | ICD-10-CM | POA: Diagnosis not present

## 2019-12-13 DIAGNOSIS — R262 Difficulty in walking, not elsewhere classified: Secondary | ICD-10-CM | POA: Diagnosis not present

## 2019-12-13 DIAGNOSIS — M25662 Stiffness of left knee, not elsewhere classified: Secondary | ICD-10-CM | POA: Diagnosis not present

## 2019-12-13 DIAGNOSIS — M6281 Muscle weakness (generalized): Secondary | ICD-10-CM | POA: Diagnosis not present

## 2019-12-14 DIAGNOSIS — N2581 Secondary hyperparathyroidism of renal origin: Secondary | ICD-10-CM | POA: Diagnosis not present

## 2019-12-14 DIAGNOSIS — D688 Other specified coagulation defects: Secondary | ICD-10-CM | POA: Diagnosis not present

## 2019-12-14 DIAGNOSIS — I482 Chronic atrial fibrillation, unspecified: Secondary | ICD-10-CM | POA: Diagnosis not present

## 2019-12-14 DIAGNOSIS — Z5181 Encounter for therapeutic drug level monitoring: Secondary | ICD-10-CM | POA: Diagnosis not present

## 2019-12-14 DIAGNOSIS — R519 Headache, unspecified: Secondary | ICD-10-CM | POA: Diagnosis not present

## 2019-12-14 DIAGNOSIS — D509 Iron deficiency anemia, unspecified: Secondary | ICD-10-CM | POA: Diagnosis not present

## 2019-12-14 DIAGNOSIS — R197 Diarrhea, unspecified: Secondary | ICD-10-CM | POA: Diagnosis not present

## 2019-12-14 DIAGNOSIS — E039 Hypothyroidism, unspecified: Secondary | ICD-10-CM | POA: Diagnosis not present

## 2019-12-14 DIAGNOSIS — Z992 Dependence on renal dialysis: Secondary | ICD-10-CM | POA: Diagnosis not present

## 2019-12-14 DIAGNOSIS — D631 Anemia in chronic kidney disease: Secondary | ICD-10-CM | POA: Diagnosis not present

## 2019-12-14 DIAGNOSIS — N186 End stage renal disease: Secondary | ICD-10-CM | POA: Diagnosis not present

## 2019-12-17 DIAGNOSIS — D688 Other specified coagulation defects: Secondary | ICD-10-CM | POA: Diagnosis not present

## 2019-12-17 DIAGNOSIS — Z992 Dependence on renal dialysis: Secondary | ICD-10-CM | POA: Diagnosis not present

## 2019-12-17 DIAGNOSIS — N2581 Secondary hyperparathyroidism of renal origin: Secondary | ICD-10-CM | POA: Diagnosis not present

## 2019-12-17 DIAGNOSIS — Z5181 Encounter for therapeutic drug level monitoring: Secondary | ICD-10-CM | POA: Diagnosis not present

## 2019-12-17 DIAGNOSIS — E039 Hypothyroidism, unspecified: Secondary | ICD-10-CM | POA: Diagnosis not present

## 2019-12-17 DIAGNOSIS — D631 Anemia in chronic kidney disease: Secondary | ICD-10-CM | POA: Diagnosis not present

## 2019-12-17 DIAGNOSIS — R197 Diarrhea, unspecified: Secondary | ICD-10-CM | POA: Diagnosis not present

## 2019-12-17 DIAGNOSIS — N186 End stage renal disease: Secondary | ICD-10-CM | POA: Diagnosis not present

## 2019-12-17 DIAGNOSIS — I482 Chronic atrial fibrillation, unspecified: Secondary | ICD-10-CM | POA: Diagnosis not present

## 2019-12-17 DIAGNOSIS — D509 Iron deficiency anemia, unspecified: Secondary | ICD-10-CM | POA: Diagnosis not present

## 2019-12-17 DIAGNOSIS — R519 Headache, unspecified: Secondary | ICD-10-CM | POA: Diagnosis not present

## 2019-12-18 DIAGNOSIS — I129 Hypertensive chronic kidney disease with stage 1 through stage 4 chronic kidney disease, or unspecified chronic kidney disease: Secondary | ICD-10-CM | POA: Diagnosis not present

## 2019-12-18 DIAGNOSIS — Z992 Dependence on renal dialysis: Secondary | ICD-10-CM | POA: Diagnosis not present

## 2019-12-18 DIAGNOSIS — N186 End stage renal disease: Secondary | ICD-10-CM | POA: Diagnosis not present

## 2019-12-18 DIAGNOSIS — R262 Difficulty in walking, not elsewhere classified: Secondary | ICD-10-CM | POA: Diagnosis not present

## 2019-12-18 DIAGNOSIS — M25662 Stiffness of left knee, not elsewhere classified: Secondary | ICD-10-CM | POA: Diagnosis not present

## 2019-12-18 DIAGNOSIS — M6281 Muscle weakness (generalized): Secondary | ICD-10-CM | POA: Diagnosis not present

## 2019-12-19 DIAGNOSIS — D688 Other specified coagulation defects: Secondary | ICD-10-CM | POA: Diagnosis not present

## 2019-12-19 DIAGNOSIS — N186 End stage renal disease: Secondary | ICD-10-CM | POA: Diagnosis not present

## 2019-12-19 DIAGNOSIS — N2581 Secondary hyperparathyroidism of renal origin: Secondary | ICD-10-CM | POA: Diagnosis not present

## 2019-12-19 DIAGNOSIS — E039 Hypothyroidism, unspecified: Secondary | ICD-10-CM | POA: Diagnosis not present

## 2019-12-19 DIAGNOSIS — R519 Headache, unspecified: Secondary | ICD-10-CM | POA: Diagnosis not present

## 2019-12-19 DIAGNOSIS — D631 Anemia in chronic kidney disease: Secondary | ICD-10-CM | POA: Diagnosis not present

## 2019-12-19 DIAGNOSIS — Z992 Dependence on renal dialysis: Secondary | ICD-10-CM | POA: Diagnosis not present

## 2019-12-19 DIAGNOSIS — D509 Iron deficiency anemia, unspecified: Secondary | ICD-10-CM | POA: Diagnosis not present

## 2019-12-19 DIAGNOSIS — Z5181 Encounter for therapeutic drug level monitoring: Secondary | ICD-10-CM | POA: Diagnosis not present

## 2019-12-19 DIAGNOSIS — R197 Diarrhea, unspecified: Secondary | ICD-10-CM | POA: Diagnosis not present

## 2019-12-19 DIAGNOSIS — I482 Chronic atrial fibrillation, unspecified: Secondary | ICD-10-CM | POA: Diagnosis not present

## 2019-12-20 DIAGNOSIS — M6281 Muscle weakness (generalized): Secondary | ICD-10-CM | POA: Diagnosis not present

## 2019-12-20 DIAGNOSIS — M25662 Stiffness of left knee, not elsewhere classified: Secondary | ICD-10-CM | POA: Diagnosis not present

## 2019-12-20 DIAGNOSIS — R262 Difficulty in walking, not elsewhere classified: Secondary | ICD-10-CM | POA: Diagnosis not present

## 2019-12-21 DIAGNOSIS — D688 Other specified coagulation defects: Secondary | ICD-10-CM | POA: Diagnosis not present

## 2019-12-21 DIAGNOSIS — R519 Headache, unspecified: Secondary | ICD-10-CM | POA: Diagnosis not present

## 2019-12-21 DIAGNOSIS — Z5181 Encounter for therapeutic drug level monitoring: Secondary | ICD-10-CM | POA: Diagnosis not present

## 2019-12-21 DIAGNOSIS — D509 Iron deficiency anemia, unspecified: Secondary | ICD-10-CM | POA: Diagnosis not present

## 2019-12-21 DIAGNOSIS — R197 Diarrhea, unspecified: Secondary | ICD-10-CM | POA: Diagnosis not present

## 2019-12-21 DIAGNOSIS — I482 Chronic atrial fibrillation, unspecified: Secondary | ICD-10-CM | POA: Diagnosis not present

## 2019-12-21 DIAGNOSIS — D631 Anemia in chronic kidney disease: Secondary | ICD-10-CM | POA: Diagnosis not present

## 2019-12-21 DIAGNOSIS — E039 Hypothyroidism, unspecified: Secondary | ICD-10-CM | POA: Diagnosis not present

## 2019-12-21 DIAGNOSIS — Z992 Dependence on renal dialysis: Secondary | ICD-10-CM | POA: Diagnosis not present

## 2019-12-21 DIAGNOSIS — N2581 Secondary hyperparathyroidism of renal origin: Secondary | ICD-10-CM | POA: Diagnosis not present

## 2019-12-21 DIAGNOSIS — N186 End stage renal disease: Secondary | ICD-10-CM | POA: Diagnosis not present

## 2019-12-24 DIAGNOSIS — R519 Headache, unspecified: Secondary | ICD-10-CM | POA: Diagnosis not present

## 2019-12-24 DIAGNOSIS — N186 End stage renal disease: Secondary | ICD-10-CM | POA: Diagnosis not present

## 2019-12-24 DIAGNOSIS — D631 Anemia in chronic kidney disease: Secondary | ICD-10-CM | POA: Diagnosis not present

## 2019-12-24 DIAGNOSIS — Z5181 Encounter for therapeutic drug level monitoring: Secondary | ICD-10-CM | POA: Diagnosis not present

## 2019-12-24 DIAGNOSIS — N2581 Secondary hyperparathyroidism of renal origin: Secondary | ICD-10-CM | POA: Diagnosis not present

## 2019-12-24 DIAGNOSIS — D509 Iron deficiency anemia, unspecified: Secondary | ICD-10-CM | POA: Diagnosis not present

## 2019-12-24 DIAGNOSIS — Z992 Dependence on renal dialysis: Secondary | ICD-10-CM | POA: Diagnosis not present

## 2019-12-24 DIAGNOSIS — I482 Chronic atrial fibrillation, unspecified: Secondary | ICD-10-CM | POA: Diagnosis not present

## 2019-12-24 DIAGNOSIS — D688 Other specified coagulation defects: Secondary | ICD-10-CM | POA: Diagnosis not present

## 2019-12-24 DIAGNOSIS — E039 Hypothyroidism, unspecified: Secondary | ICD-10-CM | POA: Diagnosis not present

## 2019-12-24 DIAGNOSIS — R197 Diarrhea, unspecified: Secondary | ICD-10-CM | POA: Diagnosis not present

## 2019-12-25 DIAGNOSIS — N186 End stage renal disease: Secondary | ICD-10-CM | POA: Diagnosis not present

## 2019-12-25 DIAGNOSIS — M25662 Stiffness of left knee, not elsewhere classified: Secondary | ICD-10-CM | POA: Diagnosis not present

## 2019-12-25 DIAGNOSIS — I480 Paroxysmal atrial fibrillation: Secondary | ICD-10-CM | POA: Diagnosis not present

## 2019-12-25 DIAGNOSIS — R262 Difficulty in walking, not elsewhere classified: Secondary | ICD-10-CM | POA: Diagnosis not present

## 2019-12-25 DIAGNOSIS — I129 Hypertensive chronic kidney disease with stage 1 through stage 4 chronic kidney disease, or unspecified chronic kidney disease: Secondary | ICD-10-CM | POA: Diagnosis not present

## 2019-12-25 DIAGNOSIS — E781 Pure hyperglyceridemia: Secondary | ICD-10-CM | POA: Diagnosis not present

## 2019-12-25 DIAGNOSIS — M6281 Muscle weakness (generalized): Secondary | ICD-10-CM | POA: Diagnosis not present

## 2019-12-26 DIAGNOSIS — D688 Other specified coagulation defects: Secondary | ICD-10-CM | POA: Diagnosis not present

## 2019-12-26 DIAGNOSIS — R197 Diarrhea, unspecified: Secondary | ICD-10-CM | POA: Diagnosis not present

## 2019-12-26 DIAGNOSIS — D509 Iron deficiency anemia, unspecified: Secondary | ICD-10-CM | POA: Diagnosis not present

## 2019-12-26 DIAGNOSIS — D631 Anemia in chronic kidney disease: Secondary | ICD-10-CM | POA: Diagnosis not present

## 2019-12-26 DIAGNOSIS — R519 Headache, unspecified: Secondary | ICD-10-CM | POA: Diagnosis not present

## 2019-12-26 DIAGNOSIS — E039 Hypothyroidism, unspecified: Secondary | ICD-10-CM | POA: Diagnosis not present

## 2019-12-26 DIAGNOSIS — I482 Chronic atrial fibrillation, unspecified: Secondary | ICD-10-CM | POA: Diagnosis not present

## 2019-12-26 DIAGNOSIS — N2581 Secondary hyperparathyroidism of renal origin: Secondary | ICD-10-CM | POA: Diagnosis not present

## 2019-12-26 DIAGNOSIS — Z992 Dependence on renal dialysis: Secondary | ICD-10-CM | POA: Diagnosis not present

## 2019-12-26 DIAGNOSIS — Z5181 Encounter for therapeutic drug level monitoring: Secondary | ICD-10-CM | POA: Diagnosis not present

## 2019-12-26 DIAGNOSIS — N186 End stage renal disease: Secondary | ICD-10-CM | POA: Diagnosis not present

## 2019-12-27 DIAGNOSIS — R262 Difficulty in walking, not elsewhere classified: Secondary | ICD-10-CM | POA: Diagnosis not present

## 2019-12-27 DIAGNOSIS — M25662 Stiffness of left knee, not elsewhere classified: Secondary | ICD-10-CM | POA: Diagnosis not present

## 2019-12-27 DIAGNOSIS — M6281 Muscle weakness (generalized): Secondary | ICD-10-CM | POA: Diagnosis not present

## 2019-12-28 DIAGNOSIS — D631 Anemia in chronic kidney disease: Secondary | ICD-10-CM | POA: Diagnosis not present

## 2019-12-28 DIAGNOSIS — N186 End stage renal disease: Secondary | ICD-10-CM | POA: Diagnosis not present

## 2019-12-28 DIAGNOSIS — I482 Chronic atrial fibrillation, unspecified: Secondary | ICD-10-CM | POA: Diagnosis not present

## 2019-12-28 DIAGNOSIS — D509 Iron deficiency anemia, unspecified: Secondary | ICD-10-CM | POA: Diagnosis not present

## 2019-12-28 DIAGNOSIS — N2581 Secondary hyperparathyroidism of renal origin: Secondary | ICD-10-CM | POA: Diagnosis not present

## 2019-12-28 DIAGNOSIS — Z992 Dependence on renal dialysis: Secondary | ICD-10-CM | POA: Diagnosis not present

## 2019-12-28 DIAGNOSIS — Z5181 Encounter for therapeutic drug level monitoring: Secondary | ICD-10-CM | POA: Diagnosis not present

## 2019-12-28 DIAGNOSIS — D688 Other specified coagulation defects: Secondary | ICD-10-CM | POA: Diagnosis not present

## 2019-12-28 DIAGNOSIS — R519 Headache, unspecified: Secondary | ICD-10-CM | POA: Diagnosis not present

## 2019-12-28 DIAGNOSIS — R197 Diarrhea, unspecified: Secondary | ICD-10-CM | POA: Diagnosis not present

## 2019-12-28 DIAGNOSIS — E039 Hypothyroidism, unspecified: Secondary | ICD-10-CM | POA: Diagnosis not present

## 2019-12-31 DIAGNOSIS — D509 Iron deficiency anemia, unspecified: Secondary | ICD-10-CM | POA: Diagnosis not present

## 2019-12-31 DIAGNOSIS — I482 Chronic atrial fibrillation, unspecified: Secondary | ICD-10-CM | POA: Diagnosis not present

## 2019-12-31 DIAGNOSIS — Z992 Dependence on renal dialysis: Secondary | ICD-10-CM | POA: Diagnosis not present

## 2019-12-31 DIAGNOSIS — R197 Diarrhea, unspecified: Secondary | ICD-10-CM | POA: Diagnosis not present

## 2019-12-31 DIAGNOSIS — R519 Headache, unspecified: Secondary | ICD-10-CM | POA: Diagnosis not present

## 2019-12-31 DIAGNOSIS — Z5181 Encounter for therapeutic drug level monitoring: Secondary | ICD-10-CM | POA: Diagnosis not present

## 2019-12-31 DIAGNOSIS — N186 End stage renal disease: Secondary | ICD-10-CM | POA: Diagnosis not present

## 2019-12-31 DIAGNOSIS — D631 Anemia in chronic kidney disease: Secondary | ICD-10-CM | POA: Diagnosis not present

## 2019-12-31 DIAGNOSIS — D688 Other specified coagulation defects: Secondary | ICD-10-CM | POA: Diagnosis not present

## 2019-12-31 DIAGNOSIS — E039 Hypothyroidism, unspecified: Secondary | ICD-10-CM | POA: Diagnosis not present

## 2019-12-31 DIAGNOSIS — N2581 Secondary hyperparathyroidism of renal origin: Secondary | ICD-10-CM | POA: Diagnosis not present

## 2020-01-01 ENCOUNTER — Other Ambulatory Visit: Payer: Self-pay

## 2020-01-01 ENCOUNTER — Ambulatory Visit (INDEPENDENT_AMBULATORY_CARE_PROVIDER_SITE_OTHER): Payer: Medicare Other | Admitting: *Deleted

## 2020-01-01 DIAGNOSIS — I480 Paroxysmal atrial fibrillation: Secondary | ICD-10-CM

## 2020-01-01 DIAGNOSIS — Z5181 Encounter for therapeutic drug level monitoring: Secondary | ICD-10-CM

## 2020-01-01 DIAGNOSIS — I4891 Unspecified atrial fibrillation: Secondary | ICD-10-CM | POA: Diagnosis not present

## 2020-01-01 LAB — POCT INR: INR: 1.9 — AB (ref 2.0–3.0)

## 2020-01-01 NOTE — Patient Instructions (Signed)
Description   Take 1 tablet today and then continue normal dose 1/2 tablet (2.5mg ) daily except for 1 tablet (5mg ) on Thursday.  Recheck INR in 3 weeks. Call with any new medications or if scheduled for any procedures or with any questions [phone]

## 2020-01-02 DIAGNOSIS — N186 End stage renal disease: Secondary | ICD-10-CM | POA: Diagnosis not present

## 2020-01-02 DIAGNOSIS — R197 Diarrhea, unspecified: Secondary | ICD-10-CM | POA: Diagnosis not present

## 2020-01-02 DIAGNOSIS — D631 Anemia in chronic kidney disease: Secondary | ICD-10-CM | POA: Diagnosis not present

## 2020-01-02 DIAGNOSIS — Z5181 Encounter for therapeutic drug level monitoring: Secondary | ICD-10-CM | POA: Diagnosis not present

## 2020-01-02 DIAGNOSIS — I482 Chronic atrial fibrillation, unspecified: Secondary | ICD-10-CM | POA: Diagnosis not present

## 2020-01-02 DIAGNOSIS — E039 Hypothyroidism, unspecified: Secondary | ICD-10-CM | POA: Diagnosis not present

## 2020-01-02 DIAGNOSIS — Z992 Dependence on renal dialysis: Secondary | ICD-10-CM | POA: Diagnosis not present

## 2020-01-02 DIAGNOSIS — R519 Headache, unspecified: Secondary | ICD-10-CM | POA: Diagnosis not present

## 2020-01-02 DIAGNOSIS — N2581 Secondary hyperparathyroidism of renal origin: Secondary | ICD-10-CM | POA: Diagnosis not present

## 2020-01-02 DIAGNOSIS — D509 Iron deficiency anemia, unspecified: Secondary | ICD-10-CM | POA: Diagnosis not present

## 2020-01-02 DIAGNOSIS — D688 Other specified coagulation defects: Secondary | ICD-10-CM | POA: Diagnosis not present

## 2020-01-04 DIAGNOSIS — Z992 Dependence on renal dialysis: Secondary | ICD-10-CM | POA: Diagnosis not present

## 2020-01-04 DIAGNOSIS — Z5181 Encounter for therapeutic drug level monitoring: Secondary | ICD-10-CM | POA: Diagnosis not present

## 2020-01-04 DIAGNOSIS — N2581 Secondary hyperparathyroidism of renal origin: Secondary | ICD-10-CM | POA: Diagnosis not present

## 2020-01-04 DIAGNOSIS — I482 Chronic atrial fibrillation, unspecified: Secondary | ICD-10-CM | POA: Diagnosis not present

## 2020-01-04 DIAGNOSIS — R519 Headache, unspecified: Secondary | ICD-10-CM | POA: Diagnosis not present

## 2020-01-04 DIAGNOSIS — D631 Anemia in chronic kidney disease: Secondary | ICD-10-CM | POA: Diagnosis not present

## 2020-01-04 DIAGNOSIS — N186 End stage renal disease: Secondary | ICD-10-CM | POA: Diagnosis not present

## 2020-01-04 DIAGNOSIS — D509 Iron deficiency anemia, unspecified: Secondary | ICD-10-CM | POA: Diagnosis not present

## 2020-01-04 DIAGNOSIS — E039 Hypothyroidism, unspecified: Secondary | ICD-10-CM | POA: Diagnosis not present

## 2020-01-04 DIAGNOSIS — D688 Other specified coagulation defects: Secondary | ICD-10-CM | POA: Diagnosis not present

## 2020-01-04 DIAGNOSIS — R197 Diarrhea, unspecified: Secondary | ICD-10-CM | POA: Diagnosis not present

## 2020-01-07 DIAGNOSIS — Z5181 Encounter for therapeutic drug level monitoring: Secondary | ICD-10-CM | POA: Diagnosis not present

## 2020-01-07 DIAGNOSIS — D631 Anemia in chronic kidney disease: Secondary | ICD-10-CM | POA: Diagnosis not present

## 2020-01-07 DIAGNOSIS — R519 Headache, unspecified: Secondary | ICD-10-CM | POA: Diagnosis not present

## 2020-01-07 DIAGNOSIS — D509 Iron deficiency anemia, unspecified: Secondary | ICD-10-CM | POA: Diagnosis not present

## 2020-01-07 DIAGNOSIS — N2581 Secondary hyperparathyroidism of renal origin: Secondary | ICD-10-CM | POA: Diagnosis not present

## 2020-01-07 DIAGNOSIS — Z992 Dependence on renal dialysis: Secondary | ICD-10-CM | POA: Diagnosis not present

## 2020-01-07 DIAGNOSIS — E039 Hypothyroidism, unspecified: Secondary | ICD-10-CM | POA: Diagnosis not present

## 2020-01-07 DIAGNOSIS — D688 Other specified coagulation defects: Secondary | ICD-10-CM | POA: Diagnosis not present

## 2020-01-07 DIAGNOSIS — N186 End stage renal disease: Secondary | ICD-10-CM | POA: Diagnosis not present

## 2020-01-07 DIAGNOSIS — I482 Chronic atrial fibrillation, unspecified: Secondary | ICD-10-CM | POA: Diagnosis not present

## 2020-01-07 DIAGNOSIS — R197 Diarrhea, unspecified: Secondary | ICD-10-CM | POA: Diagnosis not present

## 2020-01-09 DIAGNOSIS — R519 Headache, unspecified: Secondary | ICD-10-CM | POA: Diagnosis not present

## 2020-01-09 DIAGNOSIS — Z992 Dependence on renal dialysis: Secondary | ICD-10-CM | POA: Diagnosis not present

## 2020-01-09 DIAGNOSIS — Z5181 Encounter for therapeutic drug level monitoring: Secondary | ICD-10-CM | POA: Diagnosis not present

## 2020-01-09 DIAGNOSIS — D688 Other specified coagulation defects: Secondary | ICD-10-CM | POA: Diagnosis not present

## 2020-01-09 DIAGNOSIS — N186 End stage renal disease: Secondary | ICD-10-CM | POA: Diagnosis not present

## 2020-01-09 DIAGNOSIS — E039 Hypothyroidism, unspecified: Secondary | ICD-10-CM | POA: Diagnosis not present

## 2020-01-09 DIAGNOSIS — I482 Chronic atrial fibrillation, unspecified: Secondary | ICD-10-CM | POA: Diagnosis not present

## 2020-01-09 DIAGNOSIS — D509 Iron deficiency anemia, unspecified: Secondary | ICD-10-CM | POA: Diagnosis not present

## 2020-01-09 DIAGNOSIS — N2581 Secondary hyperparathyroidism of renal origin: Secondary | ICD-10-CM | POA: Diagnosis not present

## 2020-01-09 DIAGNOSIS — R197 Diarrhea, unspecified: Secondary | ICD-10-CM | POA: Diagnosis not present

## 2020-01-09 DIAGNOSIS — D631 Anemia in chronic kidney disease: Secondary | ICD-10-CM | POA: Diagnosis not present

## 2020-01-11 DIAGNOSIS — R519 Headache, unspecified: Secondary | ICD-10-CM | POA: Diagnosis not present

## 2020-01-11 DIAGNOSIS — D688 Other specified coagulation defects: Secondary | ICD-10-CM | POA: Diagnosis not present

## 2020-01-11 DIAGNOSIS — N2581 Secondary hyperparathyroidism of renal origin: Secondary | ICD-10-CM | POA: Diagnosis not present

## 2020-01-11 DIAGNOSIS — D631 Anemia in chronic kidney disease: Secondary | ICD-10-CM | POA: Diagnosis not present

## 2020-01-11 DIAGNOSIS — Z5181 Encounter for therapeutic drug level monitoring: Secondary | ICD-10-CM | POA: Diagnosis not present

## 2020-01-11 DIAGNOSIS — Z992 Dependence on renal dialysis: Secondary | ICD-10-CM | POA: Diagnosis not present

## 2020-01-11 DIAGNOSIS — E039 Hypothyroidism, unspecified: Secondary | ICD-10-CM | POA: Diagnosis not present

## 2020-01-11 DIAGNOSIS — D509 Iron deficiency anemia, unspecified: Secondary | ICD-10-CM | POA: Diagnosis not present

## 2020-01-11 DIAGNOSIS — I482 Chronic atrial fibrillation, unspecified: Secondary | ICD-10-CM | POA: Diagnosis not present

## 2020-01-11 DIAGNOSIS — N186 End stage renal disease: Secondary | ICD-10-CM | POA: Diagnosis not present

## 2020-01-11 DIAGNOSIS — R197 Diarrhea, unspecified: Secondary | ICD-10-CM | POA: Diagnosis not present

## 2020-01-14 DIAGNOSIS — Z992 Dependence on renal dialysis: Secondary | ICD-10-CM | POA: Diagnosis not present

## 2020-01-14 DIAGNOSIS — D688 Other specified coagulation defects: Secondary | ICD-10-CM | POA: Diagnosis not present

## 2020-01-14 DIAGNOSIS — R197 Diarrhea, unspecified: Secondary | ICD-10-CM | POA: Diagnosis not present

## 2020-01-14 DIAGNOSIS — I482 Chronic atrial fibrillation, unspecified: Secondary | ICD-10-CM | POA: Diagnosis not present

## 2020-01-14 DIAGNOSIS — Z5181 Encounter for therapeutic drug level monitoring: Secondary | ICD-10-CM | POA: Diagnosis not present

## 2020-01-14 DIAGNOSIS — E039 Hypothyroidism, unspecified: Secondary | ICD-10-CM | POA: Diagnosis not present

## 2020-01-14 DIAGNOSIS — N186 End stage renal disease: Secondary | ICD-10-CM | POA: Diagnosis not present

## 2020-01-14 DIAGNOSIS — N2581 Secondary hyperparathyroidism of renal origin: Secondary | ICD-10-CM | POA: Diagnosis not present

## 2020-01-14 DIAGNOSIS — R519 Headache, unspecified: Secondary | ICD-10-CM | POA: Diagnosis not present

## 2020-01-14 DIAGNOSIS — D631 Anemia in chronic kidney disease: Secondary | ICD-10-CM | POA: Diagnosis not present

## 2020-01-14 DIAGNOSIS — D509 Iron deficiency anemia, unspecified: Secondary | ICD-10-CM | POA: Diagnosis not present

## 2020-01-16 DIAGNOSIS — R519 Headache, unspecified: Secondary | ICD-10-CM | POA: Diagnosis not present

## 2020-01-16 DIAGNOSIS — D631 Anemia in chronic kidney disease: Secondary | ICD-10-CM | POA: Diagnosis not present

## 2020-01-16 DIAGNOSIS — Z992 Dependence on renal dialysis: Secondary | ICD-10-CM | POA: Diagnosis not present

## 2020-01-16 DIAGNOSIS — N186 End stage renal disease: Secondary | ICD-10-CM | POA: Diagnosis not present

## 2020-01-16 DIAGNOSIS — I482 Chronic atrial fibrillation, unspecified: Secondary | ICD-10-CM | POA: Diagnosis not present

## 2020-01-16 DIAGNOSIS — D509 Iron deficiency anemia, unspecified: Secondary | ICD-10-CM | POA: Diagnosis not present

## 2020-01-16 DIAGNOSIS — N2581 Secondary hyperparathyroidism of renal origin: Secondary | ICD-10-CM | POA: Diagnosis not present

## 2020-01-16 DIAGNOSIS — D688 Other specified coagulation defects: Secondary | ICD-10-CM | POA: Diagnosis not present

## 2020-01-16 DIAGNOSIS — Z5181 Encounter for therapeutic drug level monitoring: Secondary | ICD-10-CM | POA: Diagnosis not present

## 2020-01-16 DIAGNOSIS — E039 Hypothyroidism, unspecified: Secondary | ICD-10-CM | POA: Diagnosis not present

## 2020-01-16 DIAGNOSIS — R197 Diarrhea, unspecified: Secondary | ICD-10-CM | POA: Diagnosis not present

## 2020-01-18 DIAGNOSIS — D631 Anemia in chronic kidney disease: Secondary | ICD-10-CM | POA: Diagnosis not present

## 2020-01-18 DIAGNOSIS — I129 Hypertensive chronic kidney disease with stage 1 through stage 4 chronic kidney disease, or unspecified chronic kidney disease: Secondary | ICD-10-CM | POA: Diagnosis not present

## 2020-01-18 DIAGNOSIS — D688 Other specified coagulation defects: Secondary | ICD-10-CM | POA: Diagnosis not present

## 2020-01-18 DIAGNOSIS — N2581 Secondary hyperparathyroidism of renal origin: Secondary | ICD-10-CM | POA: Diagnosis not present

## 2020-01-18 DIAGNOSIS — I482 Chronic atrial fibrillation, unspecified: Secondary | ICD-10-CM | POA: Diagnosis not present

## 2020-01-18 DIAGNOSIS — R519 Headache, unspecified: Secondary | ICD-10-CM | POA: Diagnosis not present

## 2020-01-18 DIAGNOSIS — D509 Iron deficiency anemia, unspecified: Secondary | ICD-10-CM | POA: Diagnosis not present

## 2020-01-18 DIAGNOSIS — R197 Diarrhea, unspecified: Secondary | ICD-10-CM | POA: Diagnosis not present

## 2020-01-18 DIAGNOSIS — Z5181 Encounter for therapeutic drug level monitoring: Secondary | ICD-10-CM | POA: Diagnosis not present

## 2020-01-18 DIAGNOSIS — E039 Hypothyroidism, unspecified: Secondary | ICD-10-CM | POA: Diagnosis not present

## 2020-01-18 DIAGNOSIS — Z992 Dependence on renal dialysis: Secondary | ICD-10-CM | POA: Diagnosis not present

## 2020-01-18 DIAGNOSIS — N186 End stage renal disease: Secondary | ICD-10-CM | POA: Diagnosis not present

## 2020-01-21 DIAGNOSIS — N186 End stage renal disease: Secondary | ICD-10-CM | POA: Diagnosis not present

## 2020-01-21 DIAGNOSIS — E039 Hypothyroidism, unspecified: Secondary | ICD-10-CM | POA: Diagnosis not present

## 2020-01-21 DIAGNOSIS — I482 Chronic atrial fibrillation, unspecified: Secondary | ICD-10-CM | POA: Diagnosis not present

## 2020-01-21 DIAGNOSIS — Z5181 Encounter for therapeutic drug level monitoring: Secondary | ICD-10-CM | POA: Diagnosis not present

## 2020-01-21 DIAGNOSIS — D509 Iron deficiency anemia, unspecified: Secondary | ICD-10-CM | POA: Diagnosis not present

## 2020-01-21 DIAGNOSIS — D688 Other specified coagulation defects: Secondary | ICD-10-CM | POA: Diagnosis not present

## 2020-01-21 DIAGNOSIS — N2581 Secondary hyperparathyroidism of renal origin: Secondary | ICD-10-CM | POA: Diagnosis not present

## 2020-01-21 DIAGNOSIS — R519 Headache, unspecified: Secondary | ICD-10-CM | POA: Diagnosis not present

## 2020-01-21 DIAGNOSIS — Z992 Dependence on renal dialysis: Secondary | ICD-10-CM | POA: Diagnosis not present

## 2020-01-21 DIAGNOSIS — R197 Diarrhea, unspecified: Secondary | ICD-10-CM | POA: Diagnosis not present

## 2020-01-22 ENCOUNTER — Ambulatory Visit (INDEPENDENT_AMBULATORY_CARE_PROVIDER_SITE_OTHER): Payer: Medicare Other | Admitting: Pharmacist

## 2020-01-22 ENCOUNTER — Other Ambulatory Visit: Payer: Self-pay

## 2020-01-22 DIAGNOSIS — M1712 Unilateral primary osteoarthritis, left knee: Secondary | ICD-10-CM

## 2020-01-22 DIAGNOSIS — I480 Paroxysmal atrial fibrillation: Secondary | ICD-10-CM | POA: Diagnosis not present

## 2020-01-22 DIAGNOSIS — I4891 Unspecified atrial fibrillation: Secondary | ICD-10-CM | POA: Diagnosis not present

## 2020-01-22 DIAGNOSIS — Z5181 Encounter for therapeutic drug level monitoring: Secondary | ICD-10-CM

## 2020-01-22 LAB — POCT INR: INR: 1.6 — AB (ref 2.0–3.0)

## 2020-01-22 NOTE — Patient Instructions (Signed)
Take 1 tablet today and then start taking 1/2 tablet (2.5mg ) daily except for 1 tablet (5mg ) on Thursdays and Saturdays.  Recheck INR in 2 weeks. Call with any new medications or if scheduled for any procedures or with any questions [phone]

## 2020-01-23 DIAGNOSIS — R197 Diarrhea, unspecified: Secondary | ICD-10-CM | POA: Diagnosis not present

## 2020-01-23 DIAGNOSIS — E039 Hypothyroidism, unspecified: Secondary | ICD-10-CM | POA: Diagnosis not present

## 2020-01-23 DIAGNOSIS — D509 Iron deficiency anemia, unspecified: Secondary | ICD-10-CM | POA: Diagnosis not present

## 2020-01-23 DIAGNOSIS — I482 Chronic atrial fibrillation, unspecified: Secondary | ICD-10-CM | POA: Diagnosis not present

## 2020-01-23 DIAGNOSIS — D688 Other specified coagulation defects: Secondary | ICD-10-CM | POA: Diagnosis not present

## 2020-01-23 DIAGNOSIS — N186 End stage renal disease: Secondary | ICD-10-CM | POA: Diagnosis not present

## 2020-01-23 DIAGNOSIS — N2581 Secondary hyperparathyroidism of renal origin: Secondary | ICD-10-CM | POA: Diagnosis not present

## 2020-01-23 DIAGNOSIS — Z5181 Encounter for therapeutic drug level monitoring: Secondary | ICD-10-CM | POA: Diagnosis not present

## 2020-01-23 DIAGNOSIS — Z992 Dependence on renal dialysis: Secondary | ICD-10-CM | POA: Diagnosis not present

## 2020-01-23 DIAGNOSIS — R519 Headache, unspecified: Secondary | ICD-10-CM | POA: Diagnosis not present

## 2020-01-25 DIAGNOSIS — D509 Iron deficiency anemia, unspecified: Secondary | ICD-10-CM | POA: Diagnosis not present

## 2020-01-25 DIAGNOSIS — N2581 Secondary hyperparathyroidism of renal origin: Secondary | ICD-10-CM | POA: Diagnosis not present

## 2020-01-25 DIAGNOSIS — Z5181 Encounter for therapeutic drug level monitoring: Secondary | ICD-10-CM | POA: Diagnosis not present

## 2020-01-25 DIAGNOSIS — R519 Headache, unspecified: Secondary | ICD-10-CM | POA: Diagnosis not present

## 2020-01-25 DIAGNOSIS — R197 Diarrhea, unspecified: Secondary | ICD-10-CM | POA: Diagnosis not present

## 2020-01-25 DIAGNOSIS — Z992 Dependence on renal dialysis: Secondary | ICD-10-CM | POA: Diagnosis not present

## 2020-01-25 DIAGNOSIS — E039 Hypothyroidism, unspecified: Secondary | ICD-10-CM | POA: Diagnosis not present

## 2020-01-25 DIAGNOSIS — D688 Other specified coagulation defects: Secondary | ICD-10-CM | POA: Diagnosis not present

## 2020-01-25 DIAGNOSIS — N186 End stage renal disease: Secondary | ICD-10-CM | POA: Diagnosis not present

## 2020-01-25 DIAGNOSIS — I482 Chronic atrial fibrillation, unspecified: Secondary | ICD-10-CM | POA: Diagnosis not present

## 2020-01-28 DIAGNOSIS — Z992 Dependence on renal dialysis: Secondary | ICD-10-CM | POA: Diagnosis not present

## 2020-01-28 DIAGNOSIS — E039 Hypothyroidism, unspecified: Secondary | ICD-10-CM | POA: Diagnosis not present

## 2020-01-28 DIAGNOSIS — D509 Iron deficiency anemia, unspecified: Secondary | ICD-10-CM | POA: Diagnosis not present

## 2020-01-28 DIAGNOSIS — N2581 Secondary hyperparathyroidism of renal origin: Secondary | ICD-10-CM | POA: Diagnosis not present

## 2020-01-28 DIAGNOSIS — R197 Diarrhea, unspecified: Secondary | ICD-10-CM | POA: Diagnosis not present

## 2020-01-28 DIAGNOSIS — D688 Other specified coagulation defects: Secondary | ICD-10-CM | POA: Diagnosis not present

## 2020-01-28 DIAGNOSIS — R519 Headache, unspecified: Secondary | ICD-10-CM | POA: Diagnosis not present

## 2020-01-28 DIAGNOSIS — I482 Chronic atrial fibrillation, unspecified: Secondary | ICD-10-CM | POA: Diagnosis not present

## 2020-01-28 DIAGNOSIS — N186 End stage renal disease: Secondary | ICD-10-CM | POA: Diagnosis not present

## 2020-01-28 DIAGNOSIS — Z5181 Encounter for therapeutic drug level monitoring: Secondary | ICD-10-CM | POA: Diagnosis not present

## 2020-01-30 DIAGNOSIS — Z5181 Encounter for therapeutic drug level monitoring: Secondary | ICD-10-CM | POA: Diagnosis not present

## 2020-01-30 DIAGNOSIS — D509 Iron deficiency anemia, unspecified: Secondary | ICD-10-CM | POA: Diagnosis not present

## 2020-01-30 DIAGNOSIS — N186 End stage renal disease: Secondary | ICD-10-CM | POA: Diagnosis not present

## 2020-01-30 DIAGNOSIS — R197 Diarrhea, unspecified: Secondary | ICD-10-CM | POA: Diagnosis not present

## 2020-01-30 DIAGNOSIS — N2581 Secondary hyperparathyroidism of renal origin: Secondary | ICD-10-CM | POA: Diagnosis not present

## 2020-01-30 DIAGNOSIS — R519 Headache, unspecified: Secondary | ICD-10-CM | POA: Diagnosis not present

## 2020-01-30 DIAGNOSIS — E039 Hypothyroidism, unspecified: Secondary | ICD-10-CM | POA: Diagnosis not present

## 2020-01-30 DIAGNOSIS — I482 Chronic atrial fibrillation, unspecified: Secondary | ICD-10-CM | POA: Diagnosis not present

## 2020-01-30 DIAGNOSIS — Z992 Dependence on renal dialysis: Secondary | ICD-10-CM | POA: Diagnosis not present

## 2020-01-30 DIAGNOSIS — D688 Other specified coagulation defects: Secondary | ICD-10-CM | POA: Diagnosis not present

## 2020-02-01 DIAGNOSIS — D509 Iron deficiency anemia, unspecified: Secondary | ICD-10-CM | POA: Diagnosis not present

## 2020-02-01 DIAGNOSIS — E039 Hypothyroidism, unspecified: Secondary | ICD-10-CM | POA: Diagnosis not present

## 2020-02-01 DIAGNOSIS — Z992 Dependence on renal dialysis: Secondary | ICD-10-CM | POA: Diagnosis not present

## 2020-02-01 DIAGNOSIS — R519 Headache, unspecified: Secondary | ICD-10-CM | POA: Diagnosis not present

## 2020-02-01 DIAGNOSIS — N2581 Secondary hyperparathyroidism of renal origin: Secondary | ICD-10-CM | POA: Diagnosis not present

## 2020-02-01 DIAGNOSIS — R197 Diarrhea, unspecified: Secondary | ICD-10-CM | POA: Diagnosis not present

## 2020-02-01 DIAGNOSIS — I482 Chronic atrial fibrillation, unspecified: Secondary | ICD-10-CM | POA: Diagnosis not present

## 2020-02-01 DIAGNOSIS — D688 Other specified coagulation defects: Secondary | ICD-10-CM | POA: Diagnosis not present

## 2020-02-01 DIAGNOSIS — N186 End stage renal disease: Secondary | ICD-10-CM | POA: Diagnosis not present

## 2020-02-01 DIAGNOSIS — Z5181 Encounter for therapeutic drug level monitoring: Secondary | ICD-10-CM | POA: Diagnosis not present

## 2020-02-04 DIAGNOSIS — N2581 Secondary hyperparathyroidism of renal origin: Secondary | ICD-10-CM | POA: Diagnosis not present

## 2020-02-04 DIAGNOSIS — R519 Headache, unspecified: Secondary | ICD-10-CM | POA: Diagnosis not present

## 2020-02-04 DIAGNOSIS — N186 End stage renal disease: Secondary | ICD-10-CM | POA: Diagnosis not present

## 2020-02-04 DIAGNOSIS — D509 Iron deficiency anemia, unspecified: Secondary | ICD-10-CM | POA: Diagnosis not present

## 2020-02-04 DIAGNOSIS — R197 Diarrhea, unspecified: Secondary | ICD-10-CM | POA: Diagnosis not present

## 2020-02-04 DIAGNOSIS — I482 Chronic atrial fibrillation, unspecified: Secondary | ICD-10-CM | POA: Diagnosis not present

## 2020-02-04 DIAGNOSIS — D688 Other specified coagulation defects: Secondary | ICD-10-CM | POA: Diagnosis not present

## 2020-02-04 DIAGNOSIS — Z5181 Encounter for therapeutic drug level monitoring: Secondary | ICD-10-CM | POA: Diagnosis not present

## 2020-02-04 DIAGNOSIS — Z992 Dependence on renal dialysis: Secondary | ICD-10-CM | POA: Diagnosis not present

## 2020-02-04 DIAGNOSIS — E039 Hypothyroidism, unspecified: Secondary | ICD-10-CM | POA: Diagnosis not present

## 2020-02-05 ENCOUNTER — Other Ambulatory Visit: Payer: Self-pay

## 2020-02-05 ENCOUNTER — Ambulatory Visit (INDEPENDENT_AMBULATORY_CARE_PROVIDER_SITE_OTHER): Payer: Medicare Other | Admitting: *Deleted

## 2020-02-05 DIAGNOSIS — I4891 Unspecified atrial fibrillation: Secondary | ICD-10-CM

## 2020-02-05 DIAGNOSIS — Z5181 Encounter for therapeutic drug level monitoring: Secondary | ICD-10-CM | POA: Diagnosis not present

## 2020-02-05 DIAGNOSIS — I480 Paroxysmal atrial fibrillation: Secondary | ICD-10-CM | POA: Diagnosis not present

## 2020-02-05 LAB — POCT INR: INR: 2 (ref 2.0–3.0)

## 2020-02-05 NOTE — Patient Instructions (Addendum)
Description   Start taking 1/2 tablet (2.5mg ) daily except for 1 tablet (5mg ) on Mondays, Thursdays, and Saturdays.  Recheck INR in 3 weeks. Continue eating your normal leafy veggies. Call with any new medications or if scheduled for any procedures or with any questions [phone]

## 2020-02-06 DIAGNOSIS — E039 Hypothyroidism, unspecified: Secondary | ICD-10-CM | POA: Diagnosis not present

## 2020-02-06 DIAGNOSIS — N186 End stage renal disease: Secondary | ICD-10-CM | POA: Diagnosis not present

## 2020-02-06 DIAGNOSIS — Z5181 Encounter for therapeutic drug level monitoring: Secondary | ICD-10-CM | POA: Diagnosis not present

## 2020-02-06 DIAGNOSIS — D509 Iron deficiency anemia, unspecified: Secondary | ICD-10-CM | POA: Diagnosis not present

## 2020-02-06 DIAGNOSIS — R519 Headache, unspecified: Secondary | ICD-10-CM | POA: Diagnosis not present

## 2020-02-06 DIAGNOSIS — N2581 Secondary hyperparathyroidism of renal origin: Secondary | ICD-10-CM | POA: Diagnosis not present

## 2020-02-06 DIAGNOSIS — Z992 Dependence on renal dialysis: Secondary | ICD-10-CM | POA: Diagnosis not present

## 2020-02-06 DIAGNOSIS — D688 Other specified coagulation defects: Secondary | ICD-10-CM | POA: Diagnosis not present

## 2020-02-06 DIAGNOSIS — R197 Diarrhea, unspecified: Secondary | ICD-10-CM | POA: Diagnosis not present

## 2020-02-06 DIAGNOSIS — I482 Chronic atrial fibrillation, unspecified: Secondary | ICD-10-CM | POA: Diagnosis not present

## 2020-02-08 DIAGNOSIS — R197 Diarrhea, unspecified: Secondary | ICD-10-CM | POA: Diagnosis not present

## 2020-02-08 DIAGNOSIS — R519 Headache, unspecified: Secondary | ICD-10-CM | POA: Diagnosis not present

## 2020-02-08 DIAGNOSIS — I482 Chronic atrial fibrillation, unspecified: Secondary | ICD-10-CM | POA: Diagnosis not present

## 2020-02-08 DIAGNOSIS — E039 Hypothyroidism, unspecified: Secondary | ICD-10-CM | POA: Diagnosis not present

## 2020-02-08 DIAGNOSIS — D509 Iron deficiency anemia, unspecified: Secondary | ICD-10-CM | POA: Diagnosis not present

## 2020-02-08 DIAGNOSIS — Z992 Dependence on renal dialysis: Secondary | ICD-10-CM | POA: Diagnosis not present

## 2020-02-08 DIAGNOSIS — N2581 Secondary hyperparathyroidism of renal origin: Secondary | ICD-10-CM | POA: Diagnosis not present

## 2020-02-08 DIAGNOSIS — D688 Other specified coagulation defects: Secondary | ICD-10-CM | POA: Diagnosis not present

## 2020-02-08 DIAGNOSIS — Z5181 Encounter for therapeutic drug level monitoring: Secondary | ICD-10-CM | POA: Diagnosis not present

## 2020-02-08 DIAGNOSIS — N186 End stage renal disease: Secondary | ICD-10-CM | POA: Diagnosis not present

## 2020-02-11 DIAGNOSIS — N186 End stage renal disease: Secondary | ICD-10-CM | POA: Diagnosis not present

## 2020-02-11 DIAGNOSIS — D509 Iron deficiency anemia, unspecified: Secondary | ICD-10-CM | POA: Diagnosis not present

## 2020-02-11 DIAGNOSIS — D688 Other specified coagulation defects: Secondary | ICD-10-CM | POA: Diagnosis not present

## 2020-02-11 DIAGNOSIS — N2581 Secondary hyperparathyroidism of renal origin: Secondary | ICD-10-CM | POA: Diagnosis not present

## 2020-02-11 DIAGNOSIS — E039 Hypothyroidism, unspecified: Secondary | ICD-10-CM | POA: Diagnosis not present

## 2020-02-11 DIAGNOSIS — R519 Headache, unspecified: Secondary | ICD-10-CM | POA: Diagnosis not present

## 2020-02-11 DIAGNOSIS — I482 Chronic atrial fibrillation, unspecified: Secondary | ICD-10-CM | POA: Diagnosis not present

## 2020-02-11 DIAGNOSIS — R197 Diarrhea, unspecified: Secondary | ICD-10-CM | POA: Diagnosis not present

## 2020-02-11 DIAGNOSIS — Z992 Dependence on renal dialysis: Secondary | ICD-10-CM | POA: Diagnosis not present

## 2020-02-11 DIAGNOSIS — Z5181 Encounter for therapeutic drug level monitoring: Secondary | ICD-10-CM | POA: Diagnosis not present

## 2020-02-13 DIAGNOSIS — I482 Chronic atrial fibrillation, unspecified: Secondary | ICD-10-CM | POA: Diagnosis not present

## 2020-02-13 DIAGNOSIS — Z992 Dependence on renal dialysis: Secondary | ICD-10-CM | POA: Diagnosis not present

## 2020-02-13 DIAGNOSIS — R519 Headache, unspecified: Secondary | ICD-10-CM | POA: Diagnosis not present

## 2020-02-13 DIAGNOSIS — Z5181 Encounter for therapeutic drug level monitoring: Secondary | ICD-10-CM | POA: Diagnosis not present

## 2020-02-13 DIAGNOSIS — D688 Other specified coagulation defects: Secondary | ICD-10-CM | POA: Diagnosis not present

## 2020-02-13 DIAGNOSIS — E039 Hypothyroidism, unspecified: Secondary | ICD-10-CM | POA: Diagnosis not present

## 2020-02-13 DIAGNOSIS — D509 Iron deficiency anemia, unspecified: Secondary | ICD-10-CM | POA: Diagnosis not present

## 2020-02-13 DIAGNOSIS — R197 Diarrhea, unspecified: Secondary | ICD-10-CM | POA: Diagnosis not present

## 2020-02-13 DIAGNOSIS — N2581 Secondary hyperparathyroidism of renal origin: Secondary | ICD-10-CM | POA: Diagnosis not present

## 2020-02-13 DIAGNOSIS — N186 End stage renal disease: Secondary | ICD-10-CM | POA: Diagnosis not present

## 2020-02-14 DIAGNOSIS — E781 Pure hyperglyceridemia: Secondary | ICD-10-CM | POA: Diagnosis not present

## 2020-02-14 DIAGNOSIS — K219 Gastro-esophageal reflux disease without esophagitis: Secondary | ICD-10-CM | POA: Diagnosis not present

## 2020-02-14 DIAGNOSIS — Z1389 Encounter for screening for other disorder: Secondary | ICD-10-CM | POA: Diagnosis not present

## 2020-02-14 DIAGNOSIS — Z0000 Encounter for general adult medical examination without abnormal findings: Secondary | ICD-10-CM | POA: Diagnosis not present

## 2020-02-14 DIAGNOSIS — I129 Hypertensive chronic kidney disease with stage 1 through stage 4 chronic kidney disease, or unspecified chronic kidney disease: Secondary | ICD-10-CM | POA: Diagnosis not present

## 2020-02-15 DIAGNOSIS — D509 Iron deficiency anemia, unspecified: Secondary | ICD-10-CM | POA: Diagnosis not present

## 2020-02-15 DIAGNOSIS — I482 Chronic atrial fibrillation, unspecified: Secondary | ICD-10-CM | POA: Diagnosis not present

## 2020-02-15 DIAGNOSIS — Z5181 Encounter for therapeutic drug level monitoring: Secondary | ICD-10-CM | POA: Diagnosis not present

## 2020-02-15 DIAGNOSIS — Z992 Dependence on renal dialysis: Secondary | ICD-10-CM | POA: Diagnosis not present

## 2020-02-15 DIAGNOSIS — R197 Diarrhea, unspecified: Secondary | ICD-10-CM | POA: Diagnosis not present

## 2020-02-15 DIAGNOSIS — R519 Headache, unspecified: Secondary | ICD-10-CM | POA: Diagnosis not present

## 2020-02-15 DIAGNOSIS — N186 End stage renal disease: Secondary | ICD-10-CM | POA: Diagnosis not present

## 2020-02-15 DIAGNOSIS — D688 Other specified coagulation defects: Secondary | ICD-10-CM | POA: Diagnosis not present

## 2020-02-15 DIAGNOSIS — E039 Hypothyroidism, unspecified: Secondary | ICD-10-CM | POA: Diagnosis not present

## 2020-02-15 DIAGNOSIS — N2581 Secondary hyperparathyroidism of renal origin: Secondary | ICD-10-CM | POA: Diagnosis not present

## 2020-02-18 DIAGNOSIS — Z5181 Encounter for therapeutic drug level monitoring: Secondary | ICD-10-CM | POA: Diagnosis not present

## 2020-02-18 DIAGNOSIS — N2581 Secondary hyperparathyroidism of renal origin: Secondary | ICD-10-CM | POA: Diagnosis not present

## 2020-02-18 DIAGNOSIS — D509 Iron deficiency anemia, unspecified: Secondary | ICD-10-CM | POA: Diagnosis not present

## 2020-02-18 DIAGNOSIS — I482 Chronic atrial fibrillation, unspecified: Secondary | ICD-10-CM | POA: Diagnosis not present

## 2020-02-18 DIAGNOSIS — I129 Hypertensive chronic kidney disease with stage 1 through stage 4 chronic kidney disease, or unspecified chronic kidney disease: Secondary | ICD-10-CM | POA: Diagnosis not present

## 2020-02-18 DIAGNOSIS — R519 Headache, unspecified: Secondary | ICD-10-CM | POA: Diagnosis not present

## 2020-02-18 DIAGNOSIS — E039 Hypothyroidism, unspecified: Secondary | ICD-10-CM | POA: Diagnosis not present

## 2020-02-18 DIAGNOSIS — N186 End stage renal disease: Secondary | ICD-10-CM | POA: Diagnosis not present

## 2020-02-18 DIAGNOSIS — D688 Other specified coagulation defects: Secondary | ICD-10-CM | POA: Diagnosis not present

## 2020-02-18 DIAGNOSIS — R197 Diarrhea, unspecified: Secondary | ICD-10-CM | POA: Diagnosis not present

## 2020-02-18 DIAGNOSIS — Z992 Dependence on renal dialysis: Secondary | ICD-10-CM | POA: Diagnosis not present

## 2020-02-20 DIAGNOSIS — D631 Anemia in chronic kidney disease: Secondary | ICD-10-CM | POA: Diagnosis not present

## 2020-02-20 DIAGNOSIS — Z5181 Encounter for therapeutic drug level monitoring: Secondary | ICD-10-CM | POA: Diagnosis not present

## 2020-02-20 DIAGNOSIS — R197 Diarrhea, unspecified: Secondary | ICD-10-CM | POA: Diagnosis not present

## 2020-02-20 DIAGNOSIS — D688 Other specified coagulation defects: Secondary | ICD-10-CM | POA: Diagnosis not present

## 2020-02-20 DIAGNOSIS — Z992 Dependence on renal dialysis: Secondary | ICD-10-CM | POA: Diagnosis not present

## 2020-02-20 DIAGNOSIS — N186 End stage renal disease: Secondary | ICD-10-CM | POA: Diagnosis not present

## 2020-02-20 DIAGNOSIS — E039 Hypothyroidism, unspecified: Secondary | ICD-10-CM | POA: Diagnosis not present

## 2020-02-20 DIAGNOSIS — D509 Iron deficiency anemia, unspecified: Secondary | ICD-10-CM | POA: Diagnosis not present

## 2020-02-20 DIAGNOSIS — R519 Headache, unspecified: Secondary | ICD-10-CM | POA: Diagnosis not present

## 2020-02-20 DIAGNOSIS — I482 Chronic atrial fibrillation, unspecified: Secondary | ICD-10-CM | POA: Diagnosis not present

## 2020-02-20 DIAGNOSIS — N2581 Secondary hyperparathyroidism of renal origin: Secondary | ICD-10-CM | POA: Diagnosis not present

## 2020-02-22 DIAGNOSIS — Z992 Dependence on renal dialysis: Secondary | ICD-10-CM | POA: Diagnosis not present

## 2020-02-22 DIAGNOSIS — Z5181 Encounter for therapeutic drug level monitoring: Secondary | ICD-10-CM | POA: Diagnosis not present

## 2020-02-22 DIAGNOSIS — D688 Other specified coagulation defects: Secondary | ICD-10-CM | POA: Diagnosis not present

## 2020-02-22 DIAGNOSIS — R197 Diarrhea, unspecified: Secondary | ICD-10-CM | POA: Diagnosis not present

## 2020-02-22 DIAGNOSIS — D509 Iron deficiency anemia, unspecified: Secondary | ICD-10-CM | POA: Diagnosis not present

## 2020-02-22 DIAGNOSIS — N2581 Secondary hyperparathyroidism of renal origin: Secondary | ICD-10-CM | POA: Diagnosis not present

## 2020-02-22 DIAGNOSIS — E039 Hypothyroidism, unspecified: Secondary | ICD-10-CM | POA: Diagnosis not present

## 2020-02-22 DIAGNOSIS — N186 End stage renal disease: Secondary | ICD-10-CM | POA: Diagnosis not present

## 2020-02-22 DIAGNOSIS — I482 Chronic atrial fibrillation, unspecified: Secondary | ICD-10-CM | POA: Diagnosis not present

## 2020-02-22 DIAGNOSIS — D631 Anemia in chronic kidney disease: Secondary | ICD-10-CM | POA: Diagnosis not present

## 2020-02-22 DIAGNOSIS — R519 Headache, unspecified: Secondary | ICD-10-CM | POA: Diagnosis not present

## 2020-02-25 DIAGNOSIS — I482 Chronic atrial fibrillation, unspecified: Secondary | ICD-10-CM | POA: Diagnosis not present

## 2020-02-25 DIAGNOSIS — N186 End stage renal disease: Secondary | ICD-10-CM | POA: Diagnosis not present

## 2020-02-25 DIAGNOSIS — D631 Anemia in chronic kidney disease: Secondary | ICD-10-CM | POA: Diagnosis not present

## 2020-02-25 DIAGNOSIS — R519 Headache, unspecified: Secondary | ICD-10-CM | POA: Diagnosis not present

## 2020-02-25 DIAGNOSIS — Z992 Dependence on renal dialysis: Secondary | ICD-10-CM | POA: Diagnosis not present

## 2020-02-25 DIAGNOSIS — D509 Iron deficiency anemia, unspecified: Secondary | ICD-10-CM | POA: Diagnosis not present

## 2020-02-25 DIAGNOSIS — R197 Diarrhea, unspecified: Secondary | ICD-10-CM | POA: Diagnosis not present

## 2020-02-25 DIAGNOSIS — E039 Hypothyroidism, unspecified: Secondary | ICD-10-CM | POA: Diagnosis not present

## 2020-02-25 DIAGNOSIS — N2581 Secondary hyperparathyroidism of renal origin: Secondary | ICD-10-CM | POA: Diagnosis not present

## 2020-02-25 DIAGNOSIS — Z5181 Encounter for therapeutic drug level monitoring: Secondary | ICD-10-CM | POA: Diagnosis not present

## 2020-02-25 DIAGNOSIS — D688 Other specified coagulation defects: Secondary | ICD-10-CM | POA: Diagnosis not present

## 2020-02-26 ENCOUNTER — Other Ambulatory Visit: Payer: Self-pay

## 2020-02-26 ENCOUNTER — Ambulatory Visit (INDEPENDENT_AMBULATORY_CARE_PROVIDER_SITE_OTHER): Payer: Medicare Other | Admitting: *Deleted

## 2020-02-26 DIAGNOSIS — I4891 Unspecified atrial fibrillation: Secondary | ICD-10-CM

## 2020-02-26 DIAGNOSIS — I480 Paroxysmal atrial fibrillation: Secondary | ICD-10-CM

## 2020-02-26 DIAGNOSIS — Z5181 Encounter for therapeutic drug level monitoring: Secondary | ICD-10-CM | POA: Diagnosis not present

## 2020-02-26 LAB — POCT INR: INR: 3.2 — AB (ref 2.0–3.0)

## 2020-02-26 NOTE — Patient Instructions (Signed)
Description   Do not take tonight's dose then continue taking 1/2 tablet (2.5mg ) daily except for 1 tablet (5mg ) on Mondays, Thursdays, and Saturdays. Recheck INR in 3 weeks. Continue eating your normal leafy veggies. Call with any new medications or if scheduled for any procedures or with any questions [phone]

## 2020-02-27 DIAGNOSIS — Z992 Dependence on renal dialysis: Secondary | ICD-10-CM | POA: Diagnosis not present

## 2020-02-27 DIAGNOSIS — D631 Anemia in chronic kidney disease: Secondary | ICD-10-CM | POA: Diagnosis not present

## 2020-02-27 DIAGNOSIS — N186 End stage renal disease: Secondary | ICD-10-CM | POA: Diagnosis not present

## 2020-02-27 DIAGNOSIS — E039 Hypothyroidism, unspecified: Secondary | ICD-10-CM | POA: Diagnosis not present

## 2020-02-27 DIAGNOSIS — D688 Other specified coagulation defects: Secondary | ICD-10-CM | POA: Diagnosis not present

## 2020-02-27 DIAGNOSIS — Z5181 Encounter for therapeutic drug level monitoring: Secondary | ICD-10-CM | POA: Diagnosis not present

## 2020-02-27 DIAGNOSIS — N2581 Secondary hyperparathyroidism of renal origin: Secondary | ICD-10-CM | POA: Diagnosis not present

## 2020-02-27 DIAGNOSIS — D509 Iron deficiency anemia, unspecified: Secondary | ICD-10-CM | POA: Diagnosis not present

## 2020-02-27 DIAGNOSIS — R519 Headache, unspecified: Secondary | ICD-10-CM | POA: Diagnosis not present

## 2020-02-27 DIAGNOSIS — R197 Diarrhea, unspecified: Secondary | ICD-10-CM | POA: Diagnosis not present

## 2020-02-27 DIAGNOSIS — I482 Chronic atrial fibrillation, unspecified: Secondary | ICD-10-CM | POA: Diagnosis not present

## 2020-02-29 DIAGNOSIS — Z5181 Encounter for therapeutic drug level monitoring: Secondary | ICD-10-CM | POA: Diagnosis not present

## 2020-02-29 DIAGNOSIS — R197 Diarrhea, unspecified: Secondary | ICD-10-CM | POA: Diagnosis not present

## 2020-02-29 DIAGNOSIS — D509 Iron deficiency anemia, unspecified: Secondary | ICD-10-CM | POA: Diagnosis not present

## 2020-02-29 DIAGNOSIS — N2581 Secondary hyperparathyroidism of renal origin: Secondary | ICD-10-CM | POA: Diagnosis not present

## 2020-02-29 DIAGNOSIS — D688 Other specified coagulation defects: Secondary | ICD-10-CM | POA: Diagnosis not present

## 2020-02-29 DIAGNOSIS — N186 End stage renal disease: Secondary | ICD-10-CM | POA: Diagnosis not present

## 2020-02-29 DIAGNOSIS — R519 Headache, unspecified: Secondary | ICD-10-CM | POA: Diagnosis not present

## 2020-02-29 DIAGNOSIS — E039 Hypothyroidism, unspecified: Secondary | ICD-10-CM | POA: Diagnosis not present

## 2020-02-29 DIAGNOSIS — D631 Anemia in chronic kidney disease: Secondary | ICD-10-CM | POA: Diagnosis not present

## 2020-02-29 DIAGNOSIS — Z992 Dependence on renal dialysis: Secondary | ICD-10-CM | POA: Diagnosis not present

## 2020-02-29 DIAGNOSIS — I482 Chronic atrial fibrillation, unspecified: Secondary | ICD-10-CM | POA: Diagnosis not present

## 2020-03-03 DIAGNOSIS — Z992 Dependence on renal dialysis: Secondary | ICD-10-CM | POA: Diagnosis not present

## 2020-03-03 DIAGNOSIS — R519 Headache, unspecified: Secondary | ICD-10-CM | POA: Diagnosis not present

## 2020-03-03 DIAGNOSIS — D688 Other specified coagulation defects: Secondary | ICD-10-CM | POA: Diagnosis not present

## 2020-03-03 DIAGNOSIS — N2581 Secondary hyperparathyroidism of renal origin: Secondary | ICD-10-CM | POA: Diagnosis not present

## 2020-03-03 DIAGNOSIS — D509 Iron deficiency anemia, unspecified: Secondary | ICD-10-CM | POA: Diagnosis not present

## 2020-03-03 DIAGNOSIS — I482 Chronic atrial fibrillation, unspecified: Secondary | ICD-10-CM | POA: Diagnosis not present

## 2020-03-03 DIAGNOSIS — E039 Hypothyroidism, unspecified: Secondary | ICD-10-CM | POA: Diagnosis not present

## 2020-03-03 DIAGNOSIS — Z5181 Encounter for therapeutic drug level monitoring: Secondary | ICD-10-CM | POA: Diagnosis not present

## 2020-03-03 DIAGNOSIS — R197 Diarrhea, unspecified: Secondary | ICD-10-CM | POA: Diagnosis not present

## 2020-03-03 DIAGNOSIS — N186 End stage renal disease: Secondary | ICD-10-CM | POA: Diagnosis not present

## 2020-03-03 DIAGNOSIS — D631 Anemia in chronic kidney disease: Secondary | ICD-10-CM | POA: Diagnosis not present

## 2020-03-05 DIAGNOSIS — I482 Chronic atrial fibrillation, unspecified: Secondary | ICD-10-CM | POA: Diagnosis not present

## 2020-03-05 DIAGNOSIS — Z992 Dependence on renal dialysis: Secondary | ICD-10-CM | POA: Diagnosis not present

## 2020-03-05 DIAGNOSIS — Z5181 Encounter for therapeutic drug level monitoring: Secondary | ICD-10-CM | POA: Diagnosis not present

## 2020-03-05 DIAGNOSIS — N186 End stage renal disease: Secondary | ICD-10-CM | POA: Diagnosis not present

## 2020-03-05 DIAGNOSIS — D509 Iron deficiency anemia, unspecified: Secondary | ICD-10-CM | POA: Diagnosis not present

## 2020-03-05 DIAGNOSIS — D688 Other specified coagulation defects: Secondary | ICD-10-CM | POA: Diagnosis not present

## 2020-03-05 DIAGNOSIS — E039 Hypothyroidism, unspecified: Secondary | ICD-10-CM | POA: Diagnosis not present

## 2020-03-05 DIAGNOSIS — N2581 Secondary hyperparathyroidism of renal origin: Secondary | ICD-10-CM | POA: Diagnosis not present

## 2020-03-05 DIAGNOSIS — R197 Diarrhea, unspecified: Secondary | ICD-10-CM | POA: Diagnosis not present

## 2020-03-05 DIAGNOSIS — D631 Anemia in chronic kidney disease: Secondary | ICD-10-CM | POA: Diagnosis not present

## 2020-03-05 DIAGNOSIS — R519 Headache, unspecified: Secondary | ICD-10-CM | POA: Diagnosis not present

## 2020-03-07 DIAGNOSIS — Z992 Dependence on renal dialysis: Secondary | ICD-10-CM | POA: Diagnosis not present

## 2020-03-07 DIAGNOSIS — R197 Diarrhea, unspecified: Secondary | ICD-10-CM | POA: Diagnosis not present

## 2020-03-07 DIAGNOSIS — I482 Chronic atrial fibrillation, unspecified: Secondary | ICD-10-CM | POA: Diagnosis not present

## 2020-03-07 DIAGNOSIS — D688 Other specified coagulation defects: Secondary | ICD-10-CM | POA: Diagnosis not present

## 2020-03-07 DIAGNOSIS — N2581 Secondary hyperparathyroidism of renal origin: Secondary | ICD-10-CM | POA: Diagnosis not present

## 2020-03-07 DIAGNOSIS — N186 End stage renal disease: Secondary | ICD-10-CM | POA: Diagnosis not present

## 2020-03-07 DIAGNOSIS — Z5181 Encounter for therapeutic drug level monitoring: Secondary | ICD-10-CM | POA: Diagnosis not present

## 2020-03-07 DIAGNOSIS — R519 Headache, unspecified: Secondary | ICD-10-CM | POA: Diagnosis not present

## 2020-03-07 DIAGNOSIS — D631 Anemia in chronic kidney disease: Secondary | ICD-10-CM | POA: Diagnosis not present

## 2020-03-07 DIAGNOSIS — E039 Hypothyroidism, unspecified: Secondary | ICD-10-CM | POA: Diagnosis not present

## 2020-03-07 DIAGNOSIS — D509 Iron deficiency anemia, unspecified: Secondary | ICD-10-CM | POA: Diagnosis not present

## 2020-03-10 DIAGNOSIS — D631 Anemia in chronic kidney disease: Secondary | ICD-10-CM | POA: Diagnosis not present

## 2020-03-10 DIAGNOSIS — Z992 Dependence on renal dialysis: Secondary | ICD-10-CM | POA: Diagnosis not present

## 2020-03-10 DIAGNOSIS — N2581 Secondary hyperparathyroidism of renal origin: Secondary | ICD-10-CM | POA: Diagnosis not present

## 2020-03-10 DIAGNOSIS — E039 Hypothyroidism, unspecified: Secondary | ICD-10-CM | POA: Diagnosis not present

## 2020-03-10 DIAGNOSIS — I482 Chronic atrial fibrillation, unspecified: Secondary | ICD-10-CM | POA: Diagnosis not present

## 2020-03-10 DIAGNOSIS — R519 Headache, unspecified: Secondary | ICD-10-CM | POA: Diagnosis not present

## 2020-03-10 DIAGNOSIS — D688 Other specified coagulation defects: Secondary | ICD-10-CM | POA: Diagnosis not present

## 2020-03-10 DIAGNOSIS — D509 Iron deficiency anemia, unspecified: Secondary | ICD-10-CM | POA: Diagnosis not present

## 2020-03-10 DIAGNOSIS — N186 End stage renal disease: Secondary | ICD-10-CM | POA: Diagnosis not present

## 2020-03-10 DIAGNOSIS — Z5181 Encounter for therapeutic drug level monitoring: Secondary | ICD-10-CM | POA: Diagnosis not present

## 2020-03-10 DIAGNOSIS — R197 Diarrhea, unspecified: Secondary | ICD-10-CM | POA: Diagnosis not present

## 2020-03-12 DIAGNOSIS — Z992 Dependence on renal dialysis: Secondary | ICD-10-CM | POA: Diagnosis not present

## 2020-03-12 DIAGNOSIS — D631 Anemia in chronic kidney disease: Secondary | ICD-10-CM | POA: Diagnosis not present

## 2020-03-12 DIAGNOSIS — N2581 Secondary hyperparathyroidism of renal origin: Secondary | ICD-10-CM | POA: Diagnosis not present

## 2020-03-12 DIAGNOSIS — R519 Headache, unspecified: Secondary | ICD-10-CM | POA: Diagnosis not present

## 2020-03-12 DIAGNOSIS — Z5181 Encounter for therapeutic drug level monitoring: Secondary | ICD-10-CM | POA: Diagnosis not present

## 2020-03-12 DIAGNOSIS — N186 End stage renal disease: Secondary | ICD-10-CM | POA: Diagnosis not present

## 2020-03-12 DIAGNOSIS — R197 Diarrhea, unspecified: Secondary | ICD-10-CM | POA: Diagnosis not present

## 2020-03-12 DIAGNOSIS — D688 Other specified coagulation defects: Secondary | ICD-10-CM | POA: Diagnosis not present

## 2020-03-12 DIAGNOSIS — I482 Chronic atrial fibrillation, unspecified: Secondary | ICD-10-CM | POA: Diagnosis not present

## 2020-03-12 DIAGNOSIS — D509 Iron deficiency anemia, unspecified: Secondary | ICD-10-CM | POA: Diagnosis not present

## 2020-03-12 DIAGNOSIS — E039 Hypothyroidism, unspecified: Secondary | ICD-10-CM | POA: Diagnosis not present

## 2020-03-14 DIAGNOSIS — D688 Other specified coagulation defects: Secondary | ICD-10-CM | POA: Diagnosis not present

## 2020-03-14 DIAGNOSIS — Z992 Dependence on renal dialysis: Secondary | ICD-10-CM | POA: Diagnosis not present

## 2020-03-14 DIAGNOSIS — N186 End stage renal disease: Secondary | ICD-10-CM | POA: Diagnosis not present

## 2020-03-14 DIAGNOSIS — E039 Hypothyroidism, unspecified: Secondary | ICD-10-CM | POA: Diagnosis not present

## 2020-03-14 DIAGNOSIS — I482 Chronic atrial fibrillation, unspecified: Secondary | ICD-10-CM | POA: Diagnosis not present

## 2020-03-14 DIAGNOSIS — Z5181 Encounter for therapeutic drug level monitoring: Secondary | ICD-10-CM | POA: Diagnosis not present

## 2020-03-14 DIAGNOSIS — D631 Anemia in chronic kidney disease: Secondary | ICD-10-CM | POA: Diagnosis not present

## 2020-03-14 DIAGNOSIS — D509 Iron deficiency anemia, unspecified: Secondary | ICD-10-CM | POA: Diagnosis not present

## 2020-03-14 DIAGNOSIS — N2581 Secondary hyperparathyroidism of renal origin: Secondary | ICD-10-CM | POA: Diagnosis not present

## 2020-03-14 DIAGNOSIS — R519 Headache, unspecified: Secondary | ICD-10-CM | POA: Diagnosis not present

## 2020-03-14 DIAGNOSIS — R197 Diarrhea, unspecified: Secondary | ICD-10-CM | POA: Diagnosis not present

## 2020-03-17 DIAGNOSIS — D509 Iron deficiency anemia, unspecified: Secondary | ICD-10-CM | POA: Diagnosis not present

## 2020-03-17 DIAGNOSIS — E039 Hypothyroidism, unspecified: Secondary | ICD-10-CM | POA: Diagnosis not present

## 2020-03-17 DIAGNOSIS — R519 Headache, unspecified: Secondary | ICD-10-CM | POA: Diagnosis not present

## 2020-03-17 DIAGNOSIS — N186 End stage renal disease: Secondary | ICD-10-CM | POA: Diagnosis not present

## 2020-03-17 DIAGNOSIS — N2581 Secondary hyperparathyroidism of renal origin: Secondary | ICD-10-CM | POA: Diagnosis not present

## 2020-03-17 DIAGNOSIS — Z5181 Encounter for therapeutic drug level monitoring: Secondary | ICD-10-CM | POA: Diagnosis not present

## 2020-03-17 DIAGNOSIS — Z992 Dependence on renal dialysis: Secondary | ICD-10-CM | POA: Diagnosis not present

## 2020-03-17 DIAGNOSIS — D631 Anemia in chronic kidney disease: Secondary | ICD-10-CM | POA: Diagnosis not present

## 2020-03-17 DIAGNOSIS — D688 Other specified coagulation defects: Secondary | ICD-10-CM | POA: Diagnosis not present

## 2020-03-17 DIAGNOSIS — I482 Chronic atrial fibrillation, unspecified: Secondary | ICD-10-CM | POA: Diagnosis not present

## 2020-03-17 DIAGNOSIS — R197 Diarrhea, unspecified: Secondary | ICD-10-CM | POA: Diagnosis not present

## 2020-03-18 ENCOUNTER — Ambulatory Visit (INDEPENDENT_AMBULATORY_CARE_PROVIDER_SITE_OTHER): Payer: Medicare Other | Admitting: *Deleted

## 2020-03-18 ENCOUNTER — Other Ambulatory Visit: Payer: Self-pay

## 2020-03-18 DIAGNOSIS — I480 Paroxysmal atrial fibrillation: Secondary | ICD-10-CM | POA: Diagnosis not present

## 2020-03-18 DIAGNOSIS — Z5181 Encounter for therapeutic drug level monitoring: Secondary | ICD-10-CM

## 2020-03-18 DIAGNOSIS — I4891 Unspecified atrial fibrillation: Secondary | ICD-10-CM | POA: Diagnosis not present

## 2020-03-18 LAB — POCT INR: INR: 3.7 — AB (ref 2.0–3.0)

## 2020-03-18 NOTE — Patient Instructions (Signed)
Description    Hold warfarin today and then start taking 1/2  a tablet daily except for 1 tablet on Monday and Thursday. Recheck INR in 3 weeks. Call with any new medications or if scheduled for any procedures or with any questions [phone]

## 2020-03-19 DIAGNOSIS — Z992 Dependence on renal dialysis: Secondary | ICD-10-CM | POA: Diagnosis not present

## 2020-03-19 DIAGNOSIS — N2581 Secondary hyperparathyroidism of renal origin: Secondary | ICD-10-CM | POA: Diagnosis not present

## 2020-03-19 DIAGNOSIS — R519 Headache, unspecified: Secondary | ICD-10-CM | POA: Diagnosis not present

## 2020-03-19 DIAGNOSIS — D688 Other specified coagulation defects: Secondary | ICD-10-CM | POA: Diagnosis not present

## 2020-03-19 DIAGNOSIS — D631 Anemia in chronic kidney disease: Secondary | ICD-10-CM | POA: Diagnosis not present

## 2020-03-19 DIAGNOSIS — E039 Hypothyroidism, unspecified: Secondary | ICD-10-CM | POA: Diagnosis not present

## 2020-03-19 DIAGNOSIS — Z5181 Encounter for therapeutic drug level monitoring: Secondary | ICD-10-CM | POA: Diagnosis not present

## 2020-03-19 DIAGNOSIS — R197 Diarrhea, unspecified: Secondary | ICD-10-CM | POA: Diagnosis not present

## 2020-03-19 DIAGNOSIS — I129 Hypertensive chronic kidney disease with stage 1 through stage 4 chronic kidney disease, or unspecified chronic kidney disease: Secondary | ICD-10-CM | POA: Diagnosis not present

## 2020-03-19 DIAGNOSIS — N186 End stage renal disease: Secondary | ICD-10-CM | POA: Diagnosis not present

## 2020-03-19 DIAGNOSIS — I482 Chronic atrial fibrillation, unspecified: Secondary | ICD-10-CM | POA: Diagnosis not present

## 2020-03-19 DIAGNOSIS — D509 Iron deficiency anemia, unspecified: Secondary | ICD-10-CM | POA: Diagnosis not present

## 2020-03-21 DIAGNOSIS — N2581 Secondary hyperparathyroidism of renal origin: Secondary | ICD-10-CM | POA: Diagnosis not present

## 2020-03-21 DIAGNOSIS — D509 Iron deficiency anemia, unspecified: Secondary | ICD-10-CM | POA: Diagnosis not present

## 2020-03-21 DIAGNOSIS — E039 Hypothyroidism, unspecified: Secondary | ICD-10-CM | POA: Diagnosis not present

## 2020-03-21 DIAGNOSIS — D631 Anemia in chronic kidney disease: Secondary | ICD-10-CM | POA: Diagnosis not present

## 2020-03-21 DIAGNOSIS — N186 End stage renal disease: Secondary | ICD-10-CM | POA: Diagnosis not present

## 2020-03-21 DIAGNOSIS — Z5181 Encounter for therapeutic drug level monitoring: Secondary | ICD-10-CM | POA: Diagnosis not present

## 2020-03-21 DIAGNOSIS — R519 Headache, unspecified: Secondary | ICD-10-CM | POA: Diagnosis not present

## 2020-03-21 DIAGNOSIS — Z992 Dependence on renal dialysis: Secondary | ICD-10-CM | POA: Diagnosis not present

## 2020-03-21 DIAGNOSIS — R197 Diarrhea, unspecified: Secondary | ICD-10-CM | POA: Diagnosis not present

## 2020-03-21 DIAGNOSIS — D688 Other specified coagulation defects: Secondary | ICD-10-CM | POA: Diagnosis not present

## 2020-03-25 DIAGNOSIS — D631 Anemia in chronic kidney disease: Secondary | ICD-10-CM | POA: Diagnosis not present

## 2020-03-25 DIAGNOSIS — E039 Hypothyroidism, unspecified: Secondary | ICD-10-CM | POA: Diagnosis not present

## 2020-03-25 DIAGNOSIS — Z5181 Encounter for therapeutic drug level monitoring: Secondary | ICD-10-CM | POA: Diagnosis not present

## 2020-03-25 DIAGNOSIS — N186 End stage renal disease: Secondary | ICD-10-CM | POA: Diagnosis not present

## 2020-03-25 DIAGNOSIS — D509 Iron deficiency anemia, unspecified: Secondary | ICD-10-CM | POA: Diagnosis not present

## 2020-03-25 DIAGNOSIS — Z992 Dependence on renal dialysis: Secondary | ICD-10-CM | POA: Diagnosis not present

## 2020-03-25 DIAGNOSIS — N2581 Secondary hyperparathyroidism of renal origin: Secondary | ICD-10-CM | POA: Diagnosis not present

## 2020-03-25 DIAGNOSIS — R519 Headache, unspecified: Secondary | ICD-10-CM | POA: Diagnosis not present

## 2020-03-25 DIAGNOSIS — R197 Diarrhea, unspecified: Secondary | ICD-10-CM | POA: Diagnosis not present

## 2020-03-25 DIAGNOSIS — D688 Other specified coagulation defects: Secondary | ICD-10-CM | POA: Diagnosis not present

## 2020-03-26 DIAGNOSIS — R197 Diarrhea, unspecified: Secondary | ICD-10-CM | POA: Diagnosis not present

## 2020-03-26 DIAGNOSIS — N186 End stage renal disease: Secondary | ICD-10-CM | POA: Diagnosis not present

## 2020-03-26 DIAGNOSIS — D509 Iron deficiency anemia, unspecified: Secondary | ICD-10-CM | POA: Diagnosis not present

## 2020-03-26 DIAGNOSIS — Z992 Dependence on renal dialysis: Secondary | ICD-10-CM | POA: Diagnosis not present

## 2020-03-26 DIAGNOSIS — E039 Hypothyroidism, unspecified: Secondary | ICD-10-CM | POA: Diagnosis not present

## 2020-03-26 DIAGNOSIS — N2581 Secondary hyperparathyroidism of renal origin: Secondary | ICD-10-CM | POA: Diagnosis not present

## 2020-03-26 DIAGNOSIS — R519 Headache, unspecified: Secondary | ICD-10-CM | POA: Diagnosis not present

## 2020-03-26 DIAGNOSIS — D688 Other specified coagulation defects: Secondary | ICD-10-CM | POA: Diagnosis not present

## 2020-03-26 DIAGNOSIS — Z5181 Encounter for therapeutic drug level monitoring: Secondary | ICD-10-CM | POA: Diagnosis not present

## 2020-03-26 DIAGNOSIS — D631 Anemia in chronic kidney disease: Secondary | ICD-10-CM | POA: Diagnosis not present

## 2020-03-28 DIAGNOSIS — N2581 Secondary hyperparathyroidism of renal origin: Secondary | ICD-10-CM | POA: Diagnosis not present

## 2020-03-28 DIAGNOSIS — N186 End stage renal disease: Secondary | ICD-10-CM | POA: Diagnosis not present

## 2020-03-28 DIAGNOSIS — E039 Hypothyroidism, unspecified: Secondary | ICD-10-CM | POA: Diagnosis not present

## 2020-03-28 DIAGNOSIS — Z5181 Encounter for therapeutic drug level monitoring: Secondary | ICD-10-CM | POA: Diagnosis not present

## 2020-03-28 DIAGNOSIS — D509 Iron deficiency anemia, unspecified: Secondary | ICD-10-CM | POA: Diagnosis not present

## 2020-03-28 DIAGNOSIS — Z992 Dependence on renal dialysis: Secondary | ICD-10-CM | POA: Diagnosis not present

## 2020-03-28 DIAGNOSIS — D631 Anemia in chronic kidney disease: Secondary | ICD-10-CM | POA: Diagnosis not present

## 2020-03-28 DIAGNOSIS — R519 Headache, unspecified: Secondary | ICD-10-CM | POA: Diagnosis not present

## 2020-03-28 DIAGNOSIS — D688 Other specified coagulation defects: Secondary | ICD-10-CM | POA: Diagnosis not present

## 2020-03-28 DIAGNOSIS — R197 Diarrhea, unspecified: Secondary | ICD-10-CM | POA: Diagnosis not present

## 2020-03-31 DIAGNOSIS — D631 Anemia in chronic kidney disease: Secondary | ICD-10-CM | POA: Diagnosis not present

## 2020-03-31 DIAGNOSIS — Z5181 Encounter for therapeutic drug level monitoring: Secondary | ICD-10-CM | POA: Diagnosis not present

## 2020-03-31 DIAGNOSIS — N186 End stage renal disease: Secondary | ICD-10-CM | POA: Diagnosis not present

## 2020-03-31 DIAGNOSIS — N2581 Secondary hyperparathyroidism of renal origin: Secondary | ICD-10-CM | POA: Diagnosis not present

## 2020-03-31 DIAGNOSIS — Z992 Dependence on renal dialysis: Secondary | ICD-10-CM | POA: Diagnosis not present

## 2020-03-31 DIAGNOSIS — D509 Iron deficiency anemia, unspecified: Secondary | ICD-10-CM | POA: Diagnosis not present

## 2020-03-31 DIAGNOSIS — D688 Other specified coagulation defects: Secondary | ICD-10-CM | POA: Diagnosis not present

## 2020-03-31 DIAGNOSIS — R197 Diarrhea, unspecified: Secondary | ICD-10-CM | POA: Diagnosis not present

## 2020-03-31 DIAGNOSIS — E039 Hypothyroidism, unspecified: Secondary | ICD-10-CM | POA: Diagnosis not present

## 2020-03-31 DIAGNOSIS — R519 Headache, unspecified: Secondary | ICD-10-CM | POA: Diagnosis not present

## 2020-04-01 DIAGNOSIS — M25662 Stiffness of left knee, not elsewhere classified: Secondary | ICD-10-CM | POA: Diagnosis not present

## 2020-04-02 DIAGNOSIS — E039 Hypothyroidism, unspecified: Secondary | ICD-10-CM | POA: Diagnosis not present

## 2020-04-02 DIAGNOSIS — D631 Anemia in chronic kidney disease: Secondary | ICD-10-CM | POA: Diagnosis not present

## 2020-04-02 DIAGNOSIS — R197 Diarrhea, unspecified: Secondary | ICD-10-CM | POA: Diagnosis not present

## 2020-04-02 DIAGNOSIS — N2581 Secondary hyperparathyroidism of renal origin: Secondary | ICD-10-CM | POA: Diagnosis not present

## 2020-04-02 DIAGNOSIS — D688 Other specified coagulation defects: Secondary | ICD-10-CM | POA: Diagnosis not present

## 2020-04-02 DIAGNOSIS — Z992 Dependence on renal dialysis: Secondary | ICD-10-CM | POA: Diagnosis not present

## 2020-04-02 DIAGNOSIS — N186 End stage renal disease: Secondary | ICD-10-CM | POA: Diagnosis not present

## 2020-04-02 DIAGNOSIS — D509 Iron deficiency anemia, unspecified: Secondary | ICD-10-CM | POA: Diagnosis not present

## 2020-04-02 DIAGNOSIS — Z5181 Encounter for therapeutic drug level monitoring: Secondary | ICD-10-CM | POA: Diagnosis not present

## 2020-04-02 DIAGNOSIS — R519 Headache, unspecified: Secondary | ICD-10-CM | POA: Diagnosis not present

## 2020-04-04 DIAGNOSIS — N2581 Secondary hyperparathyroidism of renal origin: Secondary | ICD-10-CM | POA: Diagnosis not present

## 2020-04-04 DIAGNOSIS — Z5181 Encounter for therapeutic drug level monitoring: Secondary | ICD-10-CM | POA: Diagnosis not present

## 2020-04-04 DIAGNOSIS — D631 Anemia in chronic kidney disease: Secondary | ICD-10-CM | POA: Diagnosis not present

## 2020-04-04 DIAGNOSIS — E039 Hypothyroidism, unspecified: Secondary | ICD-10-CM | POA: Diagnosis not present

## 2020-04-04 DIAGNOSIS — R519 Headache, unspecified: Secondary | ICD-10-CM | POA: Diagnosis not present

## 2020-04-04 DIAGNOSIS — D509 Iron deficiency anemia, unspecified: Secondary | ICD-10-CM | POA: Diagnosis not present

## 2020-04-04 DIAGNOSIS — Z992 Dependence on renal dialysis: Secondary | ICD-10-CM | POA: Diagnosis not present

## 2020-04-04 DIAGNOSIS — D688 Other specified coagulation defects: Secondary | ICD-10-CM | POA: Diagnosis not present

## 2020-04-04 DIAGNOSIS — R197 Diarrhea, unspecified: Secondary | ICD-10-CM | POA: Diagnosis not present

## 2020-04-04 DIAGNOSIS — N186 End stage renal disease: Secondary | ICD-10-CM | POA: Diagnosis not present

## 2020-04-07 DIAGNOSIS — Z992 Dependence on renal dialysis: Secondary | ICD-10-CM | POA: Diagnosis not present

## 2020-04-07 DIAGNOSIS — N2581 Secondary hyperparathyroidism of renal origin: Secondary | ICD-10-CM | POA: Diagnosis not present

## 2020-04-07 DIAGNOSIS — D509 Iron deficiency anemia, unspecified: Secondary | ICD-10-CM | POA: Diagnosis not present

## 2020-04-07 DIAGNOSIS — R197 Diarrhea, unspecified: Secondary | ICD-10-CM | POA: Diagnosis not present

## 2020-04-07 DIAGNOSIS — N186 End stage renal disease: Secondary | ICD-10-CM | POA: Diagnosis not present

## 2020-04-07 DIAGNOSIS — D688 Other specified coagulation defects: Secondary | ICD-10-CM | POA: Diagnosis not present

## 2020-04-07 DIAGNOSIS — D631 Anemia in chronic kidney disease: Secondary | ICD-10-CM | POA: Diagnosis not present

## 2020-04-07 DIAGNOSIS — R519 Headache, unspecified: Secondary | ICD-10-CM | POA: Diagnosis not present

## 2020-04-07 DIAGNOSIS — E039 Hypothyroidism, unspecified: Secondary | ICD-10-CM | POA: Diagnosis not present

## 2020-04-07 DIAGNOSIS — Z5181 Encounter for therapeutic drug level monitoring: Secondary | ICD-10-CM | POA: Diagnosis not present

## 2020-04-08 ENCOUNTER — Other Ambulatory Visit: Payer: Self-pay

## 2020-04-08 ENCOUNTER — Ambulatory Visit (INDEPENDENT_AMBULATORY_CARE_PROVIDER_SITE_OTHER): Payer: Medicare Other | Admitting: *Deleted

## 2020-04-08 DIAGNOSIS — Z5181 Encounter for therapeutic drug level monitoring: Secondary | ICD-10-CM | POA: Diagnosis not present

## 2020-04-08 DIAGNOSIS — I480 Paroxysmal atrial fibrillation: Secondary | ICD-10-CM

## 2020-04-08 DIAGNOSIS — I4891 Unspecified atrial fibrillation: Secondary | ICD-10-CM

## 2020-04-08 LAB — POCT INR: INR: 2.3 (ref 2.0–3.0)

## 2020-04-08 NOTE — Patient Instructions (Signed)
Description    Continue taking 1/2  a tablet daily except for 1 tablet on Monday and Thursday. Recheck INR in 4 weeks. Call with any new medications or if scheduled for any procedures or with any questions [phone]

## 2020-04-09 DIAGNOSIS — R519 Headache, unspecified: Secondary | ICD-10-CM | POA: Diagnosis not present

## 2020-04-09 DIAGNOSIS — R197 Diarrhea, unspecified: Secondary | ICD-10-CM | POA: Diagnosis not present

## 2020-04-09 DIAGNOSIS — D631 Anemia in chronic kidney disease: Secondary | ICD-10-CM | POA: Diagnosis not present

## 2020-04-09 DIAGNOSIS — N186 End stage renal disease: Secondary | ICD-10-CM | POA: Diagnosis not present

## 2020-04-09 DIAGNOSIS — D509 Iron deficiency anemia, unspecified: Secondary | ICD-10-CM | POA: Diagnosis not present

## 2020-04-09 DIAGNOSIS — D688 Other specified coagulation defects: Secondary | ICD-10-CM | POA: Diagnosis not present

## 2020-04-09 DIAGNOSIS — Z992 Dependence on renal dialysis: Secondary | ICD-10-CM | POA: Diagnosis not present

## 2020-04-09 DIAGNOSIS — N2581 Secondary hyperparathyroidism of renal origin: Secondary | ICD-10-CM | POA: Diagnosis not present

## 2020-04-09 DIAGNOSIS — Z5181 Encounter for therapeutic drug level monitoring: Secondary | ICD-10-CM | POA: Diagnosis not present

## 2020-04-09 DIAGNOSIS — E039 Hypothyroidism, unspecified: Secondary | ICD-10-CM | POA: Diagnosis not present

## 2020-04-11 DIAGNOSIS — R519 Headache, unspecified: Secondary | ICD-10-CM | POA: Diagnosis not present

## 2020-04-11 DIAGNOSIS — N186 End stage renal disease: Secondary | ICD-10-CM | POA: Diagnosis not present

## 2020-04-11 DIAGNOSIS — E039 Hypothyroidism, unspecified: Secondary | ICD-10-CM | POA: Diagnosis not present

## 2020-04-11 DIAGNOSIS — R197 Diarrhea, unspecified: Secondary | ICD-10-CM | POA: Diagnosis not present

## 2020-04-11 DIAGNOSIS — N2581 Secondary hyperparathyroidism of renal origin: Secondary | ICD-10-CM | POA: Diagnosis not present

## 2020-04-11 DIAGNOSIS — Z5181 Encounter for therapeutic drug level monitoring: Secondary | ICD-10-CM | POA: Diagnosis not present

## 2020-04-11 DIAGNOSIS — D509 Iron deficiency anemia, unspecified: Secondary | ICD-10-CM | POA: Diagnosis not present

## 2020-04-11 DIAGNOSIS — Z992 Dependence on renal dialysis: Secondary | ICD-10-CM | POA: Diagnosis not present

## 2020-04-11 DIAGNOSIS — D631 Anemia in chronic kidney disease: Secondary | ICD-10-CM | POA: Diagnosis not present

## 2020-04-11 DIAGNOSIS — D688 Other specified coagulation defects: Secondary | ICD-10-CM | POA: Diagnosis not present

## 2020-04-14 DIAGNOSIS — D509 Iron deficiency anemia, unspecified: Secondary | ICD-10-CM | POA: Diagnosis not present

## 2020-04-14 DIAGNOSIS — D631 Anemia in chronic kidney disease: Secondary | ICD-10-CM | POA: Diagnosis not present

## 2020-04-14 DIAGNOSIS — Z5181 Encounter for therapeutic drug level monitoring: Secondary | ICD-10-CM | POA: Diagnosis not present

## 2020-04-14 DIAGNOSIS — Z992 Dependence on renal dialysis: Secondary | ICD-10-CM | POA: Diagnosis not present

## 2020-04-14 DIAGNOSIS — E039 Hypothyroidism, unspecified: Secondary | ICD-10-CM | POA: Diagnosis not present

## 2020-04-14 DIAGNOSIS — R519 Headache, unspecified: Secondary | ICD-10-CM | POA: Diagnosis not present

## 2020-04-14 DIAGNOSIS — R197 Diarrhea, unspecified: Secondary | ICD-10-CM | POA: Diagnosis not present

## 2020-04-14 DIAGNOSIS — D688 Other specified coagulation defects: Secondary | ICD-10-CM | POA: Diagnosis not present

## 2020-04-14 DIAGNOSIS — N186 End stage renal disease: Secondary | ICD-10-CM | POA: Diagnosis not present

## 2020-04-14 DIAGNOSIS — N2581 Secondary hyperparathyroidism of renal origin: Secondary | ICD-10-CM | POA: Diagnosis not present

## 2020-04-16 DIAGNOSIS — R197 Diarrhea, unspecified: Secondary | ICD-10-CM | POA: Diagnosis not present

## 2020-04-16 DIAGNOSIS — D509 Iron deficiency anemia, unspecified: Secondary | ICD-10-CM | POA: Diagnosis not present

## 2020-04-16 DIAGNOSIS — D688 Other specified coagulation defects: Secondary | ICD-10-CM | POA: Diagnosis not present

## 2020-04-16 DIAGNOSIS — D631 Anemia in chronic kidney disease: Secondary | ICD-10-CM | POA: Diagnosis not present

## 2020-04-16 DIAGNOSIS — N2581 Secondary hyperparathyroidism of renal origin: Secondary | ICD-10-CM | POA: Diagnosis not present

## 2020-04-16 DIAGNOSIS — Z5181 Encounter for therapeutic drug level monitoring: Secondary | ICD-10-CM | POA: Diagnosis not present

## 2020-04-16 DIAGNOSIS — R519 Headache, unspecified: Secondary | ICD-10-CM | POA: Diagnosis not present

## 2020-04-16 DIAGNOSIS — Z23 Encounter for immunization: Secondary | ICD-10-CM | POA: Diagnosis not present

## 2020-04-16 DIAGNOSIS — E039 Hypothyroidism, unspecified: Secondary | ICD-10-CM | POA: Diagnosis not present

## 2020-04-16 DIAGNOSIS — Z992 Dependence on renal dialysis: Secondary | ICD-10-CM | POA: Diagnosis not present

## 2020-04-16 DIAGNOSIS — N186 End stage renal disease: Secondary | ICD-10-CM | POA: Diagnosis not present

## 2020-04-18 DIAGNOSIS — R519 Headache, unspecified: Secondary | ICD-10-CM | POA: Diagnosis not present

## 2020-04-18 DIAGNOSIS — N2581 Secondary hyperparathyroidism of renal origin: Secondary | ICD-10-CM | POA: Diagnosis not present

## 2020-04-18 DIAGNOSIS — Z992 Dependence on renal dialysis: Secondary | ICD-10-CM | POA: Diagnosis not present

## 2020-04-18 DIAGNOSIS — E039 Hypothyroidism, unspecified: Secondary | ICD-10-CM | POA: Diagnosis not present

## 2020-04-18 DIAGNOSIS — R197 Diarrhea, unspecified: Secondary | ICD-10-CM | POA: Diagnosis not present

## 2020-04-18 DIAGNOSIS — Z5181 Encounter for therapeutic drug level monitoring: Secondary | ICD-10-CM | POA: Diagnosis not present

## 2020-04-18 DIAGNOSIS — D509 Iron deficiency anemia, unspecified: Secondary | ICD-10-CM | POA: Diagnosis not present

## 2020-04-18 DIAGNOSIS — D688 Other specified coagulation defects: Secondary | ICD-10-CM | POA: Diagnosis not present

## 2020-04-18 DIAGNOSIS — D631 Anemia in chronic kidney disease: Secondary | ICD-10-CM | POA: Diagnosis not present

## 2020-04-18 DIAGNOSIS — N186 End stage renal disease: Secondary | ICD-10-CM | POA: Diagnosis not present

## 2020-04-19 DIAGNOSIS — Z992 Dependence on renal dialysis: Secondary | ICD-10-CM | POA: Diagnosis not present

## 2020-04-19 DIAGNOSIS — N186 End stage renal disease: Secondary | ICD-10-CM | POA: Diagnosis not present

## 2020-04-19 DIAGNOSIS — I129 Hypertensive chronic kidney disease with stage 1 through stage 4 chronic kidney disease, or unspecified chronic kidney disease: Secondary | ICD-10-CM | POA: Diagnosis not present

## 2020-04-21 DIAGNOSIS — E039 Hypothyroidism, unspecified: Secondary | ICD-10-CM | POA: Diagnosis not present

## 2020-04-21 DIAGNOSIS — D509 Iron deficiency anemia, unspecified: Secondary | ICD-10-CM | POA: Diagnosis not present

## 2020-04-21 DIAGNOSIS — R519 Headache, unspecified: Secondary | ICD-10-CM | POA: Diagnosis not present

## 2020-04-21 DIAGNOSIS — R197 Diarrhea, unspecified: Secondary | ICD-10-CM | POA: Diagnosis not present

## 2020-04-21 DIAGNOSIS — I482 Chronic atrial fibrillation, unspecified: Secondary | ICD-10-CM | POA: Diagnosis not present

## 2020-04-21 DIAGNOSIS — D631 Anemia in chronic kidney disease: Secondary | ICD-10-CM | POA: Diagnosis not present

## 2020-04-21 DIAGNOSIS — Z992 Dependence on renal dialysis: Secondary | ICD-10-CM | POA: Diagnosis not present

## 2020-04-21 DIAGNOSIS — Z5181 Encounter for therapeutic drug level monitoring: Secondary | ICD-10-CM | POA: Diagnosis not present

## 2020-04-21 DIAGNOSIS — D688 Other specified coagulation defects: Secondary | ICD-10-CM | POA: Diagnosis not present

## 2020-04-21 DIAGNOSIS — N2581 Secondary hyperparathyroidism of renal origin: Secondary | ICD-10-CM | POA: Diagnosis not present

## 2020-04-21 DIAGNOSIS — N186 End stage renal disease: Secondary | ICD-10-CM | POA: Diagnosis not present

## 2020-04-23 DIAGNOSIS — D509 Iron deficiency anemia, unspecified: Secondary | ICD-10-CM | POA: Diagnosis not present

## 2020-04-23 DIAGNOSIS — N2581 Secondary hyperparathyroidism of renal origin: Secondary | ICD-10-CM | POA: Diagnosis not present

## 2020-04-23 DIAGNOSIS — N186 End stage renal disease: Secondary | ICD-10-CM | POA: Diagnosis not present

## 2020-04-23 DIAGNOSIS — I482 Chronic atrial fibrillation, unspecified: Secondary | ICD-10-CM | POA: Diagnosis not present

## 2020-04-23 DIAGNOSIS — D688 Other specified coagulation defects: Secondary | ICD-10-CM | POA: Diagnosis not present

## 2020-04-23 DIAGNOSIS — E039 Hypothyroidism, unspecified: Secondary | ICD-10-CM | POA: Diagnosis not present

## 2020-04-23 DIAGNOSIS — Z992 Dependence on renal dialysis: Secondary | ICD-10-CM | POA: Diagnosis not present

## 2020-04-23 DIAGNOSIS — R197 Diarrhea, unspecified: Secondary | ICD-10-CM | POA: Diagnosis not present

## 2020-04-23 DIAGNOSIS — R519 Headache, unspecified: Secondary | ICD-10-CM | POA: Diagnosis not present

## 2020-04-23 DIAGNOSIS — D631 Anemia in chronic kidney disease: Secondary | ICD-10-CM | POA: Diagnosis not present

## 2020-04-23 DIAGNOSIS — Z5181 Encounter for therapeutic drug level monitoring: Secondary | ICD-10-CM | POA: Diagnosis not present

## 2020-04-25 DIAGNOSIS — N2581 Secondary hyperparathyroidism of renal origin: Secondary | ICD-10-CM | POA: Diagnosis not present

## 2020-04-25 DIAGNOSIS — R519 Headache, unspecified: Secondary | ICD-10-CM | POA: Diagnosis not present

## 2020-04-25 DIAGNOSIS — I482 Chronic atrial fibrillation, unspecified: Secondary | ICD-10-CM | POA: Diagnosis not present

## 2020-04-25 DIAGNOSIS — R197 Diarrhea, unspecified: Secondary | ICD-10-CM | POA: Diagnosis not present

## 2020-04-25 DIAGNOSIS — D509 Iron deficiency anemia, unspecified: Secondary | ICD-10-CM | POA: Diagnosis not present

## 2020-04-25 DIAGNOSIS — D631 Anemia in chronic kidney disease: Secondary | ICD-10-CM | POA: Diagnosis not present

## 2020-04-25 DIAGNOSIS — Z992 Dependence on renal dialysis: Secondary | ICD-10-CM | POA: Diagnosis not present

## 2020-04-25 DIAGNOSIS — D688 Other specified coagulation defects: Secondary | ICD-10-CM | POA: Diagnosis not present

## 2020-04-25 DIAGNOSIS — E039 Hypothyroidism, unspecified: Secondary | ICD-10-CM | POA: Diagnosis not present

## 2020-04-25 DIAGNOSIS — Z5181 Encounter for therapeutic drug level monitoring: Secondary | ICD-10-CM | POA: Diagnosis not present

## 2020-04-25 DIAGNOSIS — N186 End stage renal disease: Secondary | ICD-10-CM | POA: Diagnosis not present

## 2020-04-28 DIAGNOSIS — Z5181 Encounter for therapeutic drug level monitoring: Secondary | ICD-10-CM | POA: Diagnosis not present

## 2020-04-28 DIAGNOSIS — D631 Anemia in chronic kidney disease: Secondary | ICD-10-CM | POA: Diagnosis not present

## 2020-04-28 DIAGNOSIS — R197 Diarrhea, unspecified: Secondary | ICD-10-CM | POA: Diagnosis not present

## 2020-04-28 DIAGNOSIS — N2581 Secondary hyperparathyroidism of renal origin: Secondary | ICD-10-CM | POA: Diagnosis not present

## 2020-04-28 DIAGNOSIS — R519 Headache, unspecified: Secondary | ICD-10-CM | POA: Diagnosis not present

## 2020-04-28 DIAGNOSIS — N186 End stage renal disease: Secondary | ICD-10-CM | POA: Diagnosis not present

## 2020-04-28 DIAGNOSIS — D688 Other specified coagulation defects: Secondary | ICD-10-CM | POA: Diagnosis not present

## 2020-04-28 DIAGNOSIS — E039 Hypothyroidism, unspecified: Secondary | ICD-10-CM | POA: Diagnosis not present

## 2020-04-28 DIAGNOSIS — D509 Iron deficiency anemia, unspecified: Secondary | ICD-10-CM | POA: Diagnosis not present

## 2020-04-28 DIAGNOSIS — Z992 Dependence on renal dialysis: Secondary | ICD-10-CM | POA: Diagnosis not present

## 2020-04-28 DIAGNOSIS — I482 Chronic atrial fibrillation, unspecified: Secondary | ICD-10-CM | POA: Diagnosis not present

## 2020-04-30 DIAGNOSIS — Z5181 Encounter for therapeutic drug level monitoring: Secondary | ICD-10-CM | POA: Diagnosis not present

## 2020-04-30 DIAGNOSIS — R519 Headache, unspecified: Secondary | ICD-10-CM | POA: Diagnosis not present

## 2020-04-30 DIAGNOSIS — E039 Hypothyroidism, unspecified: Secondary | ICD-10-CM | POA: Diagnosis not present

## 2020-04-30 DIAGNOSIS — N2581 Secondary hyperparathyroidism of renal origin: Secondary | ICD-10-CM | POA: Diagnosis not present

## 2020-04-30 DIAGNOSIS — D631 Anemia in chronic kidney disease: Secondary | ICD-10-CM | POA: Diagnosis not present

## 2020-04-30 DIAGNOSIS — I482 Chronic atrial fibrillation, unspecified: Secondary | ICD-10-CM | POA: Diagnosis not present

## 2020-04-30 DIAGNOSIS — D509 Iron deficiency anemia, unspecified: Secondary | ICD-10-CM | POA: Diagnosis not present

## 2020-04-30 DIAGNOSIS — R197 Diarrhea, unspecified: Secondary | ICD-10-CM | POA: Diagnosis not present

## 2020-04-30 DIAGNOSIS — D688 Other specified coagulation defects: Secondary | ICD-10-CM | POA: Diagnosis not present

## 2020-04-30 DIAGNOSIS — Z992 Dependence on renal dialysis: Secondary | ICD-10-CM | POA: Diagnosis not present

## 2020-04-30 DIAGNOSIS — N186 End stage renal disease: Secondary | ICD-10-CM | POA: Diagnosis not present

## 2020-05-02 DIAGNOSIS — Z5181 Encounter for therapeutic drug level monitoring: Secondary | ICD-10-CM | POA: Diagnosis not present

## 2020-05-02 DIAGNOSIS — R197 Diarrhea, unspecified: Secondary | ICD-10-CM | POA: Diagnosis not present

## 2020-05-02 DIAGNOSIS — D509 Iron deficiency anemia, unspecified: Secondary | ICD-10-CM | POA: Diagnosis not present

## 2020-05-02 DIAGNOSIS — N2581 Secondary hyperparathyroidism of renal origin: Secondary | ICD-10-CM | POA: Diagnosis not present

## 2020-05-02 DIAGNOSIS — D631 Anemia in chronic kidney disease: Secondary | ICD-10-CM | POA: Diagnosis not present

## 2020-05-02 DIAGNOSIS — R519 Headache, unspecified: Secondary | ICD-10-CM | POA: Diagnosis not present

## 2020-05-02 DIAGNOSIS — I482 Chronic atrial fibrillation, unspecified: Secondary | ICD-10-CM | POA: Diagnosis not present

## 2020-05-02 DIAGNOSIS — N186 End stage renal disease: Secondary | ICD-10-CM | POA: Diagnosis not present

## 2020-05-02 DIAGNOSIS — E039 Hypothyroidism, unspecified: Secondary | ICD-10-CM | POA: Diagnosis not present

## 2020-05-02 DIAGNOSIS — Z992 Dependence on renal dialysis: Secondary | ICD-10-CM | POA: Diagnosis not present

## 2020-05-02 DIAGNOSIS — D688 Other specified coagulation defects: Secondary | ICD-10-CM | POA: Diagnosis not present

## 2020-05-05 DIAGNOSIS — Z5181 Encounter for therapeutic drug level monitoring: Secondary | ICD-10-CM | POA: Diagnosis not present

## 2020-05-05 DIAGNOSIS — R197 Diarrhea, unspecified: Secondary | ICD-10-CM | POA: Diagnosis not present

## 2020-05-05 DIAGNOSIS — N186 End stage renal disease: Secondary | ICD-10-CM | POA: Diagnosis not present

## 2020-05-05 DIAGNOSIS — D509 Iron deficiency anemia, unspecified: Secondary | ICD-10-CM | POA: Diagnosis not present

## 2020-05-05 DIAGNOSIS — Z992 Dependence on renal dialysis: Secondary | ICD-10-CM | POA: Diagnosis not present

## 2020-05-05 DIAGNOSIS — N2581 Secondary hyperparathyroidism of renal origin: Secondary | ICD-10-CM | POA: Diagnosis not present

## 2020-05-05 DIAGNOSIS — D631 Anemia in chronic kidney disease: Secondary | ICD-10-CM | POA: Diagnosis not present

## 2020-05-05 DIAGNOSIS — D688 Other specified coagulation defects: Secondary | ICD-10-CM | POA: Diagnosis not present

## 2020-05-05 DIAGNOSIS — E039 Hypothyroidism, unspecified: Secondary | ICD-10-CM | POA: Diagnosis not present

## 2020-05-05 DIAGNOSIS — I482 Chronic atrial fibrillation, unspecified: Secondary | ICD-10-CM | POA: Diagnosis not present

## 2020-05-05 DIAGNOSIS — R519 Headache, unspecified: Secondary | ICD-10-CM | POA: Diagnosis not present

## 2020-05-06 ENCOUNTER — Ambulatory Visit (INDEPENDENT_AMBULATORY_CARE_PROVIDER_SITE_OTHER): Payer: Medicare Other

## 2020-05-06 ENCOUNTER — Other Ambulatory Visit: Payer: Self-pay

## 2020-05-06 DIAGNOSIS — Z5181 Encounter for therapeutic drug level monitoring: Secondary | ICD-10-CM

## 2020-05-06 DIAGNOSIS — I4891 Unspecified atrial fibrillation: Secondary | ICD-10-CM

## 2020-05-06 DIAGNOSIS — I480 Paroxysmal atrial fibrillation: Secondary | ICD-10-CM

## 2020-05-06 LAB — POCT INR: INR: 2.4 (ref 2.0–3.0)

## 2020-05-06 NOTE — Patient Instructions (Signed)
Description   Continue on same dosage 1/2 tablet daily except for 1 tablet on Mondays and Thursdays. Recheck INR in 4 weeks. Call with any new medications or if scheduled for any procedures or with any questions [phone]

## 2020-05-07 DIAGNOSIS — Z5181 Encounter for therapeutic drug level monitoring: Secondary | ICD-10-CM | POA: Diagnosis not present

## 2020-05-07 DIAGNOSIS — N186 End stage renal disease: Secondary | ICD-10-CM | POA: Diagnosis not present

## 2020-05-07 DIAGNOSIS — R519 Headache, unspecified: Secondary | ICD-10-CM | POA: Diagnosis not present

## 2020-05-07 DIAGNOSIS — R197 Diarrhea, unspecified: Secondary | ICD-10-CM | POA: Diagnosis not present

## 2020-05-07 DIAGNOSIS — E039 Hypothyroidism, unspecified: Secondary | ICD-10-CM | POA: Diagnosis not present

## 2020-05-07 DIAGNOSIS — D509 Iron deficiency anemia, unspecified: Secondary | ICD-10-CM | POA: Diagnosis not present

## 2020-05-07 DIAGNOSIS — N2581 Secondary hyperparathyroidism of renal origin: Secondary | ICD-10-CM | POA: Diagnosis not present

## 2020-05-07 DIAGNOSIS — Z992 Dependence on renal dialysis: Secondary | ICD-10-CM | POA: Diagnosis not present

## 2020-05-07 DIAGNOSIS — I482 Chronic atrial fibrillation, unspecified: Secondary | ICD-10-CM | POA: Diagnosis not present

## 2020-05-07 DIAGNOSIS — D688 Other specified coagulation defects: Secondary | ICD-10-CM | POA: Diagnosis not present

## 2020-05-07 DIAGNOSIS — D631 Anemia in chronic kidney disease: Secondary | ICD-10-CM | POA: Diagnosis not present

## 2020-05-09 DIAGNOSIS — D688 Other specified coagulation defects: Secondary | ICD-10-CM | POA: Diagnosis not present

## 2020-05-09 DIAGNOSIS — Z5181 Encounter for therapeutic drug level monitoring: Secondary | ICD-10-CM | POA: Diagnosis not present

## 2020-05-09 DIAGNOSIS — Z992 Dependence on renal dialysis: Secondary | ICD-10-CM | POA: Diagnosis not present

## 2020-05-09 DIAGNOSIS — D631 Anemia in chronic kidney disease: Secondary | ICD-10-CM | POA: Diagnosis not present

## 2020-05-09 DIAGNOSIS — I482 Chronic atrial fibrillation, unspecified: Secondary | ICD-10-CM | POA: Diagnosis not present

## 2020-05-09 DIAGNOSIS — R519 Headache, unspecified: Secondary | ICD-10-CM | POA: Diagnosis not present

## 2020-05-09 DIAGNOSIS — N186 End stage renal disease: Secondary | ICD-10-CM | POA: Diagnosis not present

## 2020-05-09 DIAGNOSIS — N2581 Secondary hyperparathyroidism of renal origin: Secondary | ICD-10-CM | POA: Diagnosis not present

## 2020-05-09 DIAGNOSIS — D509 Iron deficiency anemia, unspecified: Secondary | ICD-10-CM | POA: Diagnosis not present

## 2020-05-09 DIAGNOSIS — R197 Diarrhea, unspecified: Secondary | ICD-10-CM | POA: Diagnosis not present

## 2020-05-09 DIAGNOSIS — E039 Hypothyroidism, unspecified: Secondary | ICD-10-CM | POA: Diagnosis not present

## 2020-05-11 DIAGNOSIS — I482 Chronic atrial fibrillation, unspecified: Secondary | ICD-10-CM | POA: Diagnosis not present

## 2020-05-11 DIAGNOSIS — R197 Diarrhea, unspecified: Secondary | ICD-10-CM | POA: Diagnosis not present

## 2020-05-11 DIAGNOSIS — N186 End stage renal disease: Secondary | ICD-10-CM | POA: Diagnosis not present

## 2020-05-11 DIAGNOSIS — D688 Other specified coagulation defects: Secondary | ICD-10-CM | POA: Diagnosis not present

## 2020-05-11 DIAGNOSIS — D631 Anemia in chronic kidney disease: Secondary | ICD-10-CM | POA: Diagnosis not present

## 2020-05-11 DIAGNOSIS — N2581 Secondary hyperparathyroidism of renal origin: Secondary | ICD-10-CM | POA: Diagnosis not present

## 2020-05-11 DIAGNOSIS — R519 Headache, unspecified: Secondary | ICD-10-CM | POA: Diagnosis not present

## 2020-05-11 DIAGNOSIS — E039 Hypothyroidism, unspecified: Secondary | ICD-10-CM | POA: Diagnosis not present

## 2020-05-11 DIAGNOSIS — Z5181 Encounter for therapeutic drug level monitoring: Secondary | ICD-10-CM | POA: Diagnosis not present

## 2020-05-11 DIAGNOSIS — D509 Iron deficiency anemia, unspecified: Secondary | ICD-10-CM | POA: Diagnosis not present

## 2020-05-11 DIAGNOSIS — Z992 Dependence on renal dialysis: Secondary | ICD-10-CM | POA: Diagnosis not present

## 2020-05-13 DIAGNOSIS — R197 Diarrhea, unspecified: Secondary | ICD-10-CM | POA: Diagnosis not present

## 2020-05-13 DIAGNOSIS — N186 End stage renal disease: Secondary | ICD-10-CM | POA: Diagnosis not present

## 2020-05-13 DIAGNOSIS — I482 Chronic atrial fibrillation, unspecified: Secondary | ICD-10-CM | POA: Diagnosis not present

## 2020-05-13 DIAGNOSIS — N2581 Secondary hyperparathyroidism of renal origin: Secondary | ICD-10-CM | POA: Diagnosis not present

## 2020-05-13 DIAGNOSIS — E039 Hypothyroidism, unspecified: Secondary | ICD-10-CM | POA: Diagnosis not present

## 2020-05-13 DIAGNOSIS — Z992 Dependence on renal dialysis: Secondary | ICD-10-CM | POA: Diagnosis not present

## 2020-05-13 DIAGNOSIS — D688 Other specified coagulation defects: Secondary | ICD-10-CM | POA: Diagnosis not present

## 2020-05-13 DIAGNOSIS — D631 Anemia in chronic kidney disease: Secondary | ICD-10-CM | POA: Diagnosis not present

## 2020-05-13 DIAGNOSIS — D509 Iron deficiency anemia, unspecified: Secondary | ICD-10-CM | POA: Diagnosis not present

## 2020-05-13 DIAGNOSIS — R519 Headache, unspecified: Secondary | ICD-10-CM | POA: Diagnosis not present

## 2020-05-13 DIAGNOSIS — Z5181 Encounter for therapeutic drug level monitoring: Secondary | ICD-10-CM | POA: Diagnosis not present

## 2020-05-16 DIAGNOSIS — R197 Diarrhea, unspecified: Secondary | ICD-10-CM | POA: Diagnosis not present

## 2020-05-16 DIAGNOSIS — N2581 Secondary hyperparathyroidism of renal origin: Secondary | ICD-10-CM | POA: Diagnosis not present

## 2020-05-16 DIAGNOSIS — N186 End stage renal disease: Secondary | ICD-10-CM | POA: Diagnosis not present

## 2020-05-16 DIAGNOSIS — D631 Anemia in chronic kidney disease: Secondary | ICD-10-CM | POA: Diagnosis not present

## 2020-05-16 DIAGNOSIS — D509 Iron deficiency anemia, unspecified: Secondary | ICD-10-CM | POA: Diagnosis not present

## 2020-05-16 DIAGNOSIS — I482 Chronic atrial fibrillation, unspecified: Secondary | ICD-10-CM | POA: Diagnosis not present

## 2020-05-16 DIAGNOSIS — D688 Other specified coagulation defects: Secondary | ICD-10-CM | POA: Diagnosis not present

## 2020-05-16 DIAGNOSIS — Z992 Dependence on renal dialysis: Secondary | ICD-10-CM | POA: Diagnosis not present

## 2020-05-16 DIAGNOSIS — E039 Hypothyroidism, unspecified: Secondary | ICD-10-CM | POA: Diagnosis not present

## 2020-05-16 DIAGNOSIS — Z5181 Encounter for therapeutic drug level monitoring: Secondary | ICD-10-CM | POA: Diagnosis not present

## 2020-05-16 DIAGNOSIS — R519 Headache, unspecified: Secondary | ICD-10-CM | POA: Diagnosis not present

## 2020-05-19 DIAGNOSIS — D509 Iron deficiency anemia, unspecified: Secondary | ICD-10-CM | POA: Diagnosis not present

## 2020-05-19 DIAGNOSIS — D688 Other specified coagulation defects: Secondary | ICD-10-CM | POA: Diagnosis not present

## 2020-05-19 DIAGNOSIS — N2581 Secondary hyperparathyroidism of renal origin: Secondary | ICD-10-CM | POA: Diagnosis not present

## 2020-05-19 DIAGNOSIS — E039 Hypothyroidism, unspecified: Secondary | ICD-10-CM | POA: Diagnosis not present

## 2020-05-19 DIAGNOSIS — Z5181 Encounter for therapeutic drug level monitoring: Secondary | ICD-10-CM | POA: Diagnosis not present

## 2020-05-19 DIAGNOSIS — Z992 Dependence on renal dialysis: Secondary | ICD-10-CM | POA: Diagnosis not present

## 2020-05-19 DIAGNOSIS — N186 End stage renal disease: Secondary | ICD-10-CM | POA: Diagnosis not present

## 2020-05-19 DIAGNOSIS — I482 Chronic atrial fibrillation, unspecified: Secondary | ICD-10-CM | POA: Diagnosis not present

## 2020-05-19 DIAGNOSIS — R519 Headache, unspecified: Secondary | ICD-10-CM | POA: Diagnosis not present

## 2020-05-19 DIAGNOSIS — D631 Anemia in chronic kidney disease: Secondary | ICD-10-CM | POA: Diagnosis not present

## 2020-05-19 DIAGNOSIS — R197 Diarrhea, unspecified: Secondary | ICD-10-CM | POA: Diagnosis not present

## 2020-05-19 DIAGNOSIS — I129 Hypertensive chronic kidney disease with stage 1 through stage 4 chronic kidney disease, or unspecified chronic kidney disease: Secondary | ICD-10-CM | POA: Diagnosis not present

## 2020-05-21 DIAGNOSIS — E039 Hypothyroidism, unspecified: Secondary | ICD-10-CM | POA: Diagnosis not present

## 2020-05-21 DIAGNOSIS — N2581 Secondary hyperparathyroidism of renal origin: Secondary | ICD-10-CM | POA: Diagnosis not present

## 2020-05-21 DIAGNOSIS — D688 Other specified coagulation defects: Secondary | ICD-10-CM | POA: Diagnosis not present

## 2020-05-21 DIAGNOSIS — Z992 Dependence on renal dialysis: Secondary | ICD-10-CM | POA: Diagnosis not present

## 2020-05-21 DIAGNOSIS — D509 Iron deficiency anemia, unspecified: Secondary | ICD-10-CM | POA: Diagnosis not present

## 2020-05-21 DIAGNOSIS — Z5181 Encounter for therapeutic drug level monitoring: Secondary | ICD-10-CM | POA: Diagnosis not present

## 2020-05-21 DIAGNOSIS — I482 Chronic atrial fibrillation, unspecified: Secondary | ICD-10-CM | POA: Diagnosis not present

## 2020-05-21 DIAGNOSIS — N186 End stage renal disease: Secondary | ICD-10-CM | POA: Diagnosis not present

## 2020-05-21 DIAGNOSIS — R519 Headache, unspecified: Secondary | ICD-10-CM | POA: Diagnosis not present

## 2020-05-21 DIAGNOSIS — D631 Anemia in chronic kidney disease: Secondary | ICD-10-CM | POA: Diagnosis not present

## 2020-05-21 DIAGNOSIS — R197 Diarrhea, unspecified: Secondary | ICD-10-CM | POA: Diagnosis not present

## 2020-05-23 DIAGNOSIS — E039 Hypothyroidism, unspecified: Secondary | ICD-10-CM | POA: Diagnosis not present

## 2020-05-23 DIAGNOSIS — D509 Iron deficiency anemia, unspecified: Secondary | ICD-10-CM | POA: Diagnosis not present

## 2020-05-23 DIAGNOSIS — Z992 Dependence on renal dialysis: Secondary | ICD-10-CM | POA: Diagnosis not present

## 2020-05-23 DIAGNOSIS — N186 End stage renal disease: Secondary | ICD-10-CM | POA: Diagnosis not present

## 2020-05-23 DIAGNOSIS — R197 Diarrhea, unspecified: Secondary | ICD-10-CM | POA: Diagnosis not present

## 2020-05-23 DIAGNOSIS — R519 Headache, unspecified: Secondary | ICD-10-CM | POA: Diagnosis not present

## 2020-05-23 DIAGNOSIS — I482 Chronic atrial fibrillation, unspecified: Secondary | ICD-10-CM | POA: Diagnosis not present

## 2020-05-23 DIAGNOSIS — D631 Anemia in chronic kidney disease: Secondary | ICD-10-CM | POA: Diagnosis not present

## 2020-05-23 DIAGNOSIS — N2581 Secondary hyperparathyroidism of renal origin: Secondary | ICD-10-CM | POA: Diagnosis not present

## 2020-05-23 DIAGNOSIS — Z5181 Encounter for therapeutic drug level monitoring: Secondary | ICD-10-CM | POA: Diagnosis not present

## 2020-05-23 DIAGNOSIS — D688 Other specified coagulation defects: Secondary | ICD-10-CM | POA: Diagnosis not present

## 2020-05-26 DIAGNOSIS — D688 Other specified coagulation defects: Secondary | ICD-10-CM | POA: Diagnosis not present

## 2020-05-26 DIAGNOSIS — I482 Chronic atrial fibrillation, unspecified: Secondary | ICD-10-CM | POA: Diagnosis not present

## 2020-05-26 DIAGNOSIS — E039 Hypothyroidism, unspecified: Secondary | ICD-10-CM | POA: Diagnosis not present

## 2020-05-26 DIAGNOSIS — D509 Iron deficiency anemia, unspecified: Secondary | ICD-10-CM | POA: Diagnosis not present

## 2020-05-26 DIAGNOSIS — R197 Diarrhea, unspecified: Secondary | ICD-10-CM | POA: Diagnosis not present

## 2020-05-26 DIAGNOSIS — D631 Anemia in chronic kidney disease: Secondary | ICD-10-CM | POA: Diagnosis not present

## 2020-05-26 DIAGNOSIS — Z992 Dependence on renal dialysis: Secondary | ICD-10-CM | POA: Diagnosis not present

## 2020-05-26 DIAGNOSIS — R519 Headache, unspecified: Secondary | ICD-10-CM | POA: Diagnosis not present

## 2020-05-26 DIAGNOSIS — Z5181 Encounter for therapeutic drug level monitoring: Secondary | ICD-10-CM | POA: Diagnosis not present

## 2020-05-26 DIAGNOSIS — N2581 Secondary hyperparathyroidism of renal origin: Secondary | ICD-10-CM | POA: Diagnosis not present

## 2020-05-26 DIAGNOSIS — N186 End stage renal disease: Secondary | ICD-10-CM | POA: Diagnosis not present

## 2020-05-28 DIAGNOSIS — R519 Headache, unspecified: Secondary | ICD-10-CM | POA: Diagnosis not present

## 2020-05-28 DIAGNOSIS — D688 Other specified coagulation defects: Secondary | ICD-10-CM | POA: Diagnosis not present

## 2020-05-28 DIAGNOSIS — D631 Anemia in chronic kidney disease: Secondary | ICD-10-CM | POA: Diagnosis not present

## 2020-05-28 DIAGNOSIS — E039 Hypothyroidism, unspecified: Secondary | ICD-10-CM | POA: Diagnosis not present

## 2020-05-28 DIAGNOSIS — I482 Chronic atrial fibrillation, unspecified: Secondary | ICD-10-CM | POA: Diagnosis not present

## 2020-05-28 DIAGNOSIS — Z992 Dependence on renal dialysis: Secondary | ICD-10-CM | POA: Diagnosis not present

## 2020-05-28 DIAGNOSIS — N186 End stage renal disease: Secondary | ICD-10-CM | POA: Diagnosis not present

## 2020-05-28 DIAGNOSIS — D509 Iron deficiency anemia, unspecified: Secondary | ICD-10-CM | POA: Diagnosis not present

## 2020-05-28 DIAGNOSIS — R197 Diarrhea, unspecified: Secondary | ICD-10-CM | POA: Diagnosis not present

## 2020-05-28 DIAGNOSIS — Z5181 Encounter for therapeutic drug level monitoring: Secondary | ICD-10-CM | POA: Diagnosis not present

## 2020-05-28 DIAGNOSIS — N2581 Secondary hyperparathyroidism of renal origin: Secondary | ICD-10-CM | POA: Diagnosis not present

## 2020-05-29 ENCOUNTER — Ambulatory Visit (INDEPENDENT_AMBULATORY_CARE_PROVIDER_SITE_OTHER): Payer: Medicare Other | Admitting: Interventional Cardiology

## 2020-05-29 ENCOUNTER — Encounter: Payer: Self-pay | Admitting: Interventional Cardiology

## 2020-05-29 ENCOUNTER — Ambulatory Visit (INDEPENDENT_AMBULATORY_CARE_PROVIDER_SITE_OTHER): Payer: Medicare Other | Admitting: *Deleted

## 2020-05-29 ENCOUNTER — Other Ambulatory Visit: Payer: Self-pay

## 2020-05-29 VITALS — BP 160/64 | HR 84 | Ht 63.0 in | Wt 172.8 lb

## 2020-05-29 DIAGNOSIS — Z5181 Encounter for therapeutic drug level monitoring: Secondary | ICD-10-CM | POA: Diagnosis not present

## 2020-05-29 DIAGNOSIS — Z7901 Long term (current) use of anticoagulants: Secondary | ICD-10-CM

## 2020-05-29 DIAGNOSIS — I480 Paroxysmal atrial fibrillation: Secondary | ICD-10-CM | POA: Diagnosis not present

## 2020-05-29 DIAGNOSIS — I350 Nonrheumatic aortic (valve) stenosis: Secondary | ICD-10-CM

## 2020-05-29 DIAGNOSIS — N186 End stage renal disease: Secondary | ICD-10-CM

## 2020-05-29 DIAGNOSIS — I4891 Unspecified atrial fibrillation: Secondary | ICD-10-CM | POA: Diagnosis not present

## 2020-05-29 LAB — POCT INR: INR: 2.4 (ref 2.0–3.0)

## 2020-05-29 NOTE — Progress Notes (Signed)
Cardiology Office Note   Date:  05/29/2020   ID:  [name], DOB Oct 26, 1945, MRN [id number]  PCP:  Lajean Manes, MD    No chief complaint on file.  Aortic stenosis  Wt Readings from Last 3 Encounters:  05/29/20 172 lb 12.8 oz (78.4 kg)  10/22/19 184 lb 9 oz (83.7 kg)  10/18/19 184 lb 9 oz (83.7 kg)       History of Present Illness: [name] is a 74 y.o. female  with a hx of Dilated cardiomyopathy with EF 20-25% in the setting of sepsis in 7829 complicated by demand ischemia and transient atrial fibrillation. She had an abnormal stress test that prompted cardiac catheterization which demonstrated normal coronary arteries in 2014. Other history includes HL, HTN, diastolic HF, ESRD on hemodialysis, recurrent sepsis status post percutaneous nephrostomy, chronic anemia.  She progressed to [facility] 2017.Admitted 6/2-6/5/17with chest pain. CXR showed moderate L pleural effusion and trace R pleural effusion new from prior. CT without contrast showed left lung base consolidation with layering pleural effusion favored to represent pneumonia, with small but intermediate density pericardial effusion new since November (consider pericarditis), chronic large gastric hiatal hernia, calcified coronary atherosclerosis. Albumin was 2.3. Cardiac enzymes were negative. D-dimer was elevated at 3.25. Patient was treated with antibiotics and underwent left thoracentesis. She was followed by pulmonology. Pleural effusion was transudative. She developed AF with RVR in the hospital and was seen by cardiology. Repeat echo demonstrated normal LV function and possible trivial pericardial effusion. She was loaded with amiodarone for an attempt at rhythm control. She returned to NSR.  Effusion resolved on subsequent echo.  Echo in 9/18: "Normal LV systolic function; mild LVH; grade 2 diastolic dysfunction;  calcified aortic valve with moderate to severe AS and trace AI; moderate  to severe  MR; severe LAE; mild TR with moderate pulmonary hypertension.  2. Left ventricular ejection fraction, by estimation, is 55 to 60%. The  left ventricle has normal function. The left ventricle has no regional  wall motion abnormalities. There is mild left ventricular hypertrophy.  Left ventricular diastolic parameters  are consistent with Grade II diastolic dysfunction (pseudonormalization).  3. Right ventricular systolic function is normal. The right ventricular  size is normal. There is moderately elevated pulmonary artery systolic  pressure.  4. Left atrial size was severely dilated.  5. The mitral valve is normal in structure. Moderate to severe mitral  valve regurgitation. No evidence of mitral stenosis.  6. The aortic valve has an indeterminant number of cusps. Aortic valve  regurgitation is trivial. Moderate to severe aortic valve stenosis.  7. The inferior vena cava is dilated in size with >50% respiratory  variability, suggesting right atrial pressure of 8 mmHg."  She had TKR in 5/21 on the left.  Everything went well.   Denies : Chest pain. Dizziness. Leg edema. Nitroglycerin use. Orthopnea. Palpitations. Paroxysmal nocturnal dyspnea. Shortness of breath. Syncope.   Not really exercising, but getting around much better.  No falls.    Past Medical History:  Diagnosis Date  . Arthritis of left knee   . Cardiomyopathy ([place])    a. h/o LV dysfunction EF 20-25% in 2013 due to sepsis.>> improved to normal   . Chronic diastolic CHF (congestive heart failure) ([place])    10/ 2013 in setting of septic shock  . Complication of anesthesia    use a little anesthesia , per patient MD states she quit breathing (2016); hard to wake up  . ESRD (end  stage renal disease) ([facility])    dialysis Tues, Thurs, Sat [address], sees dr deterding   . GERD (gastroesophageal reflux disease)   . Glaucoma    both eyes  . H/O hiatal hernia    a. CT 2017: large gastric hiatal hernia.  Marland Kitchen  Headache(784.0)    migraine hx of  . History of blood transfusion 04/13/2015   . History of echocardiogram    a. Echo 6/17: EF 60-65%, normal wall motion, mild MR, atrial septal lipomatous hypertrophy, PASP 34 mmHg, possible trivial free-flowing pericardial effusion along RV free wall // b. Echo 5/17: Mild LVH, EF 55-60%, normal wall motion, grade 1 diastolic dysfunction, trivial MR, severe LAE, mild RAE, PASP 42 mmHg  . History of kidney stones    10/18/2019: per patient "has a couple currently one in each kidney"  . History of nephrostomy 04/11/2015   currently inplace 04/28/2015  removed now  . History of nuclear stress test    a. Myoview 1/14 - Marked ischemia in the basal anterior, mid anterior, apical septal and apical inferior regions, EF 63% >> LHC normal   . Hyperlipidemia   . Hypertension    medication removed from regimen due to low blood pressure   . Iron deficiency    hx  . Myocardial infarction [facility]) 2013   10/18/2019: per patient "in 2013)  . Nephrolithiasis 2002, 2006   bilateral  . Normal coronary arteries 2014   a. LHC in 1/14: normal coronary arteries  . PAF (paroxysmal atrial fibrillation) ([place])    a. 10/ 2013  in setting of Septic Shock //  b. recurrent during admit for pneumonia, L effusion >> placed on Amiodarone // Coumadin for anticoagulation  . Pneumonia jan 2018, last tme lungs clear now   dx 10-06-2014 per CXR--  on 10-27-2014 pt states finished antibiotic and denies cough or fever  . Primary localized osteoarthritis of right hip 10/22/2019  . S/P hemodialysis catheter insertion ([place]) 04/11/2015    right anterior chest , only used once   . Sigmoid diverticulosis   . SOB (shortness of breath) on exertion    10/18/2019: per patient "get short of breath with activity sometimes due to heart valve"  . UTI (urinary tract infection) 05/10/2016    Past Surgical History:  Procedure Laterality Date  . AV FISTULA PLACEMENT Left 06/02/2015   Procedure: BRACHIOCEPHALIC  ARTERIOVENOUS (AV) FISTULA CREATION ;  Surgeon: Conrad Mercersburg, MD;  Location: [place];  Service: Vascular;  Laterality: Left;  . BASCILIC VEIN TRANSPOSITION Left 07/27/2015   Procedure: FIRST STAGE BASILIC VEIN TRANSPOSITION LEFT UPPER ARM;  Surgeon: Conrad English, MD;  Location: [place];  Service: Vascular;  Laterality: Left;  . BASCILIC VEIN TRANSPOSITION Left 09/2015   second phase  . BASCILIC VEIN TRANSPOSITION Left 10/12/2015   Procedure: SECOND STAGE BASILIC VEIN TRANSPOSITION LEFT ARM;  Surgeon: Conrad , MD;  Location: [place];  Service: Vascular;  Laterality: Left;  . BREAST BIOPSY Left 08/23/07   benign fibrocystic with duct ectasia  . CARDIAC CATHETERIZATION  07-11-2012  dr Irish Lack   Abnormal stress test/   normal coronary arteries/  LVEDP  7mmHg  . CARDIOVASCULAR STRESS TEST  06-26-2012  dr Irish Lack   marked ischemia in the basal anterior, mid anterior, apical inferior regions/  normal LVF, ef 63%  . CATARACT EXTRACTION W/ INTRAOCULAR LENS  IMPLANT, BILATERAL    . COLONOSCOPY WITH PROPOFOL N/A 10/17/2016   Procedure: COLONOSCOPY WITH PROPOFOL;  Surgeon: Garlan Fair,  MD;  Location: [facility];  Service: Endoscopy;  Laterality: N/A;  . CYSTOSCOPY W/ URETERAL STENT PLACEMENT  04/04/2012   Procedure: CYSTOSCOPY WITH RETROGRADE PYELOGRAM/URETERAL STENT PLACEMENT;  Surgeon: Ailene Rud, MD;  Location: [place];  Service: Urology;  Laterality: Left;  . CYSTOSCOPY W/ URETERAL STENT PLACEMENT Bilateral 05/04/2015   Procedure: CYSTOSCOPY WITH BILATERAL RETROGRADE PYELOGRAM/ WITH INTERPRETATION, EXCHANGE OF RIGHT URETERAL STENT REPLACEMENT AND PLACEMENT LEFT URETERAL STENT PLACEMENT EXAMINATION OF VAGINA;  Surgeon: Carolan Clines, MD;  Location: [facility];  Service: Urology;  Laterality: Bilateral;  . CYSTOSCOPY WITH STENT PLACEMENT Right 10/28/2014   Procedure: RIGHT URETERAL STENT PLACEMENT;  Surgeon: Irine Seal, MD;  Location[facility];  Service: Urology;  Laterality:  Right;  . CYSTOSCOPY WITH STENT PLACEMENT Right 02/26/2015   Procedure: CYSTOSCOPY RETROGRADE PYELOGRAM WITH STENT PLACEMENT;  Surgeon: Cleon Gustin, MD;  Location: [facility];  Service: Urology;  Laterality: Right;  . CYSTOSCOPY/RETROGRADE/URETEROSCOPY/STONE EXTRACTION WITH BASKET Right 11/21/2014   Procedure: CYSTOSCOPY/RIGHT RETROGRADE PYELOGRAM/RIGHT URETEROSCOPY/BASKET EXTRACTION/RIGHT PYELOSCOPY/LASER OF STONE/RIGHT DOUBLE J STENT;  Surgeon: Carolan Clines, MD;  Location: [place];  Service: Urology;  Laterality: Right;  . ESOPHAGOGASTRODUODENOSCOPY (EGD) WITH PROPOFOL Left 08/25/2018   Procedure: ESOPHAGOGASTRODUODENOSCOPY (EGD) WITH PROPOFOL;  Surgeon: Ronald Lobo, MD;  Location: [place];  Service: Endoscopy;  Laterality: Left;  . EXTRACORPOREAL SHOCK WAVE LITHOTRIPSY  05-28-2012  &  10-08-2012  . HOLMIUM LASER APPLICATION Right 3/0/1601   Procedure: HOLMIUM LASER APPLICATION;  Surgeon: Carolan Clines, MD;  Location: [facility];  Service: Urology;  Laterality: Right;  . IR GENERIC HISTORICAL  02/01/2016   IR NEPHROSTOMY EXCHANGE RIGHT 02/01/2016 Greggory Keen, MD [facility]  . IR GENERIC HISTORICAL  02/24/2016   IR PATIENT EVAL TECH 0-60 MINS 02/24/2016 Aletta Edouard, MD [facility]  . KNEE ARTHROSCOPY Left 02-14-2003  . LAPAROSCOPIC CHOLECYSTECTOMY  03-23-2005  . TOTAL ABDOMINAL HYSTERECTOMY W/ BILATERAL SALPINGOOPHORECTOMY  1993   secondary to fibroids  . TOTAL KNEE ARTHROPLASTY Left 10/22/2019  . TOTAL KNEE ARTHROPLASTY Left 10/22/2019   Procedure: TOTAL KNEE ARTHROPLASTY;  Surgeon: Marchia Bond, MD;  Location: [place];  Service: Orthopedics;  Laterality: Left;  . TRANSTHORACIC ECHOCARDIOGRAM  04-09-2012   normal LVF,  ef 60-65%,  mild LAE,  mild TR, trivial MR and PR     Current Outpatient Medications  Medication Sig Dispense Refill  . amiodarone (PACERONE) 200 MG tablet TAKE ONE TABLET BY MOUTH DAILY (Patient taking differently:  Take 200 mg by mouth daily.) 90 tablet 3  . bimatoprost (LUMIGAN) 0.01 % SOLN Place 1 drop into both eyes at bedtime.    . calcium acetate (PHOSLO) 667 MG capsule Take 1,334 mg by mouth 3 (three) times daily with meals.     . cephALEXin (KEFLEX) 250 MG capsule Take 250 mg by mouth daily.    Marland Kitchen lidocaine-prilocaine (EMLA) cream Apply 1 application topically 3 (three) times a week.     . loperamide (IMODIUM A-D) 2 MG tablet Take 4-6 mg by mouth 4 (four) times daily as needed for diarrhea or loose stools.    . Methoxy PEG-Epoetin Beta (MIRCERA IJ) as needed.    . multivitamin (RENA-VIT) TABS tablet Take 1 tablet by mouth at bedtime.    . ondansetron (ZOFRAN) 4 MG tablet Take 4 mg by mouth every 8 (eight) hours as needed for nausea or vomiting.    Marland Kitchen oxyCODONE (ROXICODONE) 5 MG immediate release tablet Take 1 tablet (5 mg total) by mouth every 4 (  four) hours as needed for severe pain. 30 tablet 0  . saccharomyces boulardii (FLORASTOR) 250 MG capsule Take 1 capsule (250 mg total) by mouth 2 (two) times daily. 60 capsule 0  . sodium chloride (OCEAN) 0.65 % SOLN nasal spray Place 1 spray into both nostrils 3 (three) times daily as needed for congestion.    . timolol (BETIMOL) 0.5 % ophthalmic solution Place 1 drop into both eyes every morning.     . warfarin (COUMADIN) 5 MG tablet TAKE 1/2 TO 1 TABLET BY MOUTH DAILY AS DIRECTED BY COUMADIN CLINIC  Hold Coumadin tonight 10/24/19 but resume normal home dose tomorrow (10/25/19) 90 tablet 1   No current facility-administered medications for this visit.    Allergies:   Astemizole, Fluorouracil, Vicodin [hydrocodone-acetaminophen], Chlorhexidine, and Percocet [oxycodone-acetaminophen]    Social History:  The patient  reports that she has never smoked. She has never used smokeless tobacco. She reports that she does not drink alcohol and does not use drugs.   Family History:  The patient's family history includes Bladder Cancer in her maternal grandfather;  Cancer (age of onset: 36) in her father; Cancer (age of onset: 58) in her mother; Dementia in her mother; Diabetes in her brother; Heart disease in her brother; Heart failure in her paternal grandmother; Hypertension in her brother and mother.    ROS:  Please see the history of present illness.   Otherwise, review of systems are positive for leg edema, left knee feels better.   All other systems are reviewed and negative.    PHYSICAL EXAM: VS:  BP (!) 160/64   Pulse 84   Ht 5\' 3"  (1.6 m)   Wt 172 lb 12.8 oz (78.4 kg)   LMP 01/19/1992 (Approximate)   SpO2 90%   BMI 30.61 kg/m  , BMI Body mass index is 30.61 kg/m. GEN: Well nourished, well developed, in no acute distress  HEENT: normal  Neck: no JVD, carotid bruits, or masses Cardiac: RRR; 3/6 murmur, no rubs, or gallops,; 1+ bilateral leg edema  Respiratory:  clear to auscultation bilaterally, normal work of breathing GI: soft, nontender, nondistended, + BS MS: no deformity or atrophy  Skin: warm and dry, no rash Neuro:  Strength and sensation are intact Psych: euthymic mood, full affect   EKG:   The ekg ordered today demonstrates NSR, RBBB   Recent Labs: 11/13/2019: BUN 23; Creatinine, Ser 5.38; Hemoglobin 9.3; Platelets 320; Potassium 3.3; Sodium 134   Lipid Panel    Component Value Date/Time   CHOL 146 11/20/2015 1302   TRIG 84 07/04/2015 1014   HDL 43 07/04/2015 1014   CHOLHDL 2.9 07/04/2015 1014   VLDL 17 07/04/2015 1014   LDLCALC 63 07/04/2015 1014     Other studies Reviewed: Additional studies/ records that were reviewed today with results demonstrating: Labs reviewed- LDL 103 in 8/18.  Needs repeat. .   ASSESSMENT AND PLAN:  1. PAF: No sx of AFib.  Warfarin for stroke prevention.  Gettig checked today.  2. Moderate-severe AS: No syncope.  No CHF.  Tolerated surgery well.  Repeat the echo in 4/22.  May need consideration of AVR at some point if gradient worsens or if she develops sx.   3. Anticoagulated:  No  bleeding issues.  4. ESRD: Dialysis 3x/week.  Tolerating this well.    Current medicines are reviewed at length with the patient today.  The patient concerns regarding her medicines were addressed.  The following changes have been made:  No change  Labs/ tests ordered today include:  No orders of the defined types were placed in this encounter.   Recommend 150 minutes/week of aerobic exercise Low fat, low carb, high fiber diet recommended  Disposition:   FU in 1 year   Signed, Larae Grooms, MD  05/29/2020 10:01 AM    [facility] [address], [address] [place], [place]  [address] Phone: [phone]; Fax: [phone]

## 2020-05-29 NOTE — Patient Instructions (Addendum)
Description   Continue taking Warfarin 1/2 tablet daily except for 1 tablet on Mondays and Thursdays. Recheck INR in 6 weeks. Call with any new medications or if scheduled for any procedures or with any questions [phone]

## 2020-05-29 NOTE — Patient Instructions (Signed)
Medication Instructions:  Your physician recommends that you continue on your current medications as directed. Please refer to the Current Medication list given to you today.  *If you need a refill on your cardiac medications before your next appointment, please call your pharmacy*   Lab Work: Your physician recommends that you have a FASTING lipid profile done. Prescription given with orders.  If you have labs (blood work) drawn today and your tests are completely normal, you will receive your results only by: Marland Kitchen MyChart Message (if you have MyChart) OR . A paper copy in the mail If you have any lab test that is abnormal or we need to change your treatment, we will call you to review the results.   Testing/Procedures: Your physician has requested that you have an echocardiogram in April 2022. Echocardiography is a painless test that uses sound waves to create images of your heart. It provides your doctor with information about the size and shape of your heart and how well your heart's chambers and valves are working. This procedure takes approximately one hour. There are no restrictions for this procedure.     Follow-Up: At [facility], you and your health needs are our priority.  As part of our continuing mission to provide you with exceptional heart care, we have created designated Provider Care Teams.  These Care Teams include your primary Cardiologist (physician) and Advanced Practice Providers (APPs -  Physician Assistants and Nurse Practitioners) who all work together to provide you with the care you need, when you need it.  We recommend signing up for the patient portal called "MyChart".  Sign up information is provided on this After Visit Summary.  MyChart is used to connect with patients for Virtual Visits (Telemedicine).  Patients are able to view lab/test results, encounter notes, upcoming appointments, etc.  Non-urgent messages can be sent to your provider as well.   To learn  more about what you can do with MyChart, go to [email or web address].    Your next appointment:   12 month(s)  The format for your next appointment:   In Person  Provider:   You may see Larae Grooms, MD or one of the following Advanced Practice Providers on your designated Care Team:    Melina Copa, PA-C  Ermalinda Barrios, PA-C    Other Instructions None

## 2020-05-30 DIAGNOSIS — I482 Chronic atrial fibrillation, unspecified: Secondary | ICD-10-CM | POA: Diagnosis not present

## 2020-05-30 DIAGNOSIS — E039 Hypothyroidism, unspecified: Secondary | ICD-10-CM | POA: Diagnosis not present

## 2020-05-30 DIAGNOSIS — N186 End stage renal disease: Secondary | ICD-10-CM | POA: Diagnosis not present

## 2020-05-30 DIAGNOSIS — D688 Other specified coagulation defects: Secondary | ICD-10-CM | POA: Diagnosis not present

## 2020-05-30 DIAGNOSIS — Z5181 Encounter for therapeutic drug level monitoring: Secondary | ICD-10-CM | POA: Diagnosis not present

## 2020-05-30 DIAGNOSIS — R519 Headache, unspecified: Secondary | ICD-10-CM | POA: Diagnosis not present

## 2020-05-30 DIAGNOSIS — D631 Anemia in chronic kidney disease: Secondary | ICD-10-CM | POA: Diagnosis not present

## 2020-05-30 DIAGNOSIS — Z992 Dependence on renal dialysis: Secondary | ICD-10-CM | POA: Diagnosis not present

## 2020-05-30 DIAGNOSIS — D509 Iron deficiency anemia, unspecified: Secondary | ICD-10-CM | POA: Diagnosis not present

## 2020-05-30 DIAGNOSIS — N2581 Secondary hyperparathyroidism of renal origin: Secondary | ICD-10-CM | POA: Diagnosis not present

## 2020-05-30 DIAGNOSIS — R197 Diarrhea, unspecified: Secondary | ICD-10-CM | POA: Diagnosis not present

## 2020-06-02 DIAGNOSIS — R197 Diarrhea, unspecified: Secondary | ICD-10-CM | POA: Diagnosis not present

## 2020-06-02 DIAGNOSIS — I482 Chronic atrial fibrillation, unspecified: Secondary | ICD-10-CM | POA: Diagnosis not present

## 2020-06-02 DIAGNOSIS — D688 Other specified coagulation defects: Secondary | ICD-10-CM | POA: Diagnosis not present

## 2020-06-02 DIAGNOSIS — N186 End stage renal disease: Secondary | ICD-10-CM | POA: Diagnosis not present

## 2020-06-02 DIAGNOSIS — Z5181 Encounter for therapeutic drug level monitoring: Secondary | ICD-10-CM | POA: Diagnosis not present

## 2020-06-02 DIAGNOSIS — Z992 Dependence on renal dialysis: Secondary | ICD-10-CM | POA: Diagnosis not present

## 2020-06-02 DIAGNOSIS — N2581 Secondary hyperparathyroidism of renal origin: Secondary | ICD-10-CM | POA: Diagnosis not present

## 2020-06-02 DIAGNOSIS — E039 Hypothyroidism, unspecified: Secondary | ICD-10-CM | POA: Diagnosis not present

## 2020-06-02 DIAGNOSIS — D509 Iron deficiency anemia, unspecified: Secondary | ICD-10-CM | POA: Diagnosis not present

## 2020-06-02 DIAGNOSIS — D631 Anemia in chronic kidney disease: Secondary | ICD-10-CM | POA: Diagnosis not present

## 2020-06-02 DIAGNOSIS — R519 Headache, unspecified: Secondary | ICD-10-CM | POA: Diagnosis not present

## 2020-06-04 DIAGNOSIS — D509 Iron deficiency anemia, unspecified: Secondary | ICD-10-CM | POA: Diagnosis not present

## 2020-06-04 DIAGNOSIS — I482 Chronic atrial fibrillation, unspecified: Secondary | ICD-10-CM | POA: Diagnosis not present

## 2020-06-04 DIAGNOSIS — R197 Diarrhea, unspecified: Secondary | ICD-10-CM | POA: Diagnosis not present

## 2020-06-04 DIAGNOSIS — N186 End stage renal disease: Secondary | ICD-10-CM | POA: Diagnosis not present

## 2020-06-04 DIAGNOSIS — N2581 Secondary hyperparathyroidism of renal origin: Secondary | ICD-10-CM | POA: Diagnosis not present

## 2020-06-04 DIAGNOSIS — D631 Anemia in chronic kidney disease: Secondary | ICD-10-CM | POA: Diagnosis not present

## 2020-06-04 DIAGNOSIS — E039 Hypothyroidism, unspecified: Secondary | ICD-10-CM | POA: Diagnosis not present

## 2020-06-04 DIAGNOSIS — D688 Other specified coagulation defects: Secondary | ICD-10-CM | POA: Diagnosis not present

## 2020-06-04 DIAGNOSIS — R519 Headache, unspecified: Secondary | ICD-10-CM | POA: Diagnosis not present

## 2020-06-04 DIAGNOSIS — Z5181 Encounter for therapeutic drug level monitoring: Secondary | ICD-10-CM | POA: Diagnosis not present

## 2020-06-04 DIAGNOSIS — Z992 Dependence on renal dialysis: Secondary | ICD-10-CM | POA: Diagnosis not present

## 2020-06-06 DIAGNOSIS — D688 Other specified coagulation defects: Secondary | ICD-10-CM | POA: Diagnosis not present

## 2020-06-06 DIAGNOSIS — N2581 Secondary hyperparathyroidism of renal origin: Secondary | ICD-10-CM | POA: Diagnosis not present

## 2020-06-06 DIAGNOSIS — N186 End stage renal disease: Secondary | ICD-10-CM | POA: Diagnosis not present

## 2020-06-06 DIAGNOSIS — E039 Hypothyroidism, unspecified: Secondary | ICD-10-CM | POA: Diagnosis not present

## 2020-06-06 DIAGNOSIS — I482 Chronic atrial fibrillation, unspecified: Secondary | ICD-10-CM | POA: Diagnosis not present

## 2020-06-06 DIAGNOSIS — D509 Iron deficiency anemia, unspecified: Secondary | ICD-10-CM | POA: Diagnosis not present

## 2020-06-06 DIAGNOSIS — Z992 Dependence on renal dialysis: Secondary | ICD-10-CM | POA: Diagnosis not present

## 2020-06-06 DIAGNOSIS — Z5181 Encounter for therapeutic drug level monitoring: Secondary | ICD-10-CM | POA: Diagnosis not present

## 2020-06-06 DIAGNOSIS — R519 Headache, unspecified: Secondary | ICD-10-CM | POA: Diagnosis not present

## 2020-06-06 DIAGNOSIS — D631 Anemia in chronic kidney disease: Secondary | ICD-10-CM | POA: Diagnosis not present

## 2020-06-06 DIAGNOSIS — R197 Diarrhea, unspecified: Secondary | ICD-10-CM | POA: Diagnosis not present

## 2020-06-07 ENCOUNTER — Emergency Department (HOSPITAL_COMMUNITY): Payer: Medicare Other

## 2020-06-07 ENCOUNTER — Other Ambulatory Visit: Payer: Self-pay

## 2020-06-07 ENCOUNTER — Inpatient Hospital Stay (HOSPITAL_COMMUNITY)
Admission: EM | Admit: 2020-06-07 | Discharge: 2020-06-24 | DRG: 521 | Disposition: A | Discharge status: Skilled Nursing Facility | Payer: Medicare Other | Attending: Internal Medicine | Admitting: Internal Medicine

## 2020-06-07 DIAGNOSIS — W01198A Fall on same level from slipping, tripping and stumbling with subsequent striking against other object, initial encounter: Secondary | ICD-10-CM | POA: Diagnosis present

## 2020-06-07 DIAGNOSIS — H409 Unspecified glaucoma: Secondary | ICD-10-CM | POA: Diagnosis present

## 2020-06-07 DIAGNOSIS — R34 Anuria and oliguria: Secondary | ICD-10-CM | POA: Diagnosis not present

## 2020-06-07 DIAGNOSIS — K5900 Constipation, unspecified: Secondary | ICD-10-CM | POA: Diagnosis not present

## 2020-06-07 DIAGNOSIS — D649 Anemia, unspecified: Secondary | ICD-10-CM | POA: Diagnosis not present

## 2020-06-07 DIAGNOSIS — E781 Pure hyperglyceridemia: Secondary | ICD-10-CM | POA: Diagnosis not present

## 2020-06-07 DIAGNOSIS — I132 Hypertensive heart and chronic kidney disease with heart failure and with stage 5 chronic kidney disease, or end stage renal disease: Secondary | ICD-10-CM | POA: Diagnosis not present

## 2020-06-07 DIAGNOSIS — K30 Functional dyspepsia: Secondary | ICD-10-CM | POA: Diagnosis present

## 2020-06-07 DIAGNOSIS — R197 Diarrhea, unspecified: Secondary | ICD-10-CM | POA: Diagnosis present

## 2020-06-07 DIAGNOSIS — I4510 Unspecified right bundle-branch block: Secondary | ICD-10-CM | POA: Diagnosis present

## 2020-06-07 DIAGNOSIS — Z885 Allergy status to narcotic agent status: Secondary | ICD-10-CM

## 2020-06-07 DIAGNOSIS — Z90722 Acquired absence of ovaries, bilateral: Secondary | ICD-10-CM

## 2020-06-07 DIAGNOSIS — M7542 Impingement syndrome of left shoulder: Secondary | ICD-10-CM

## 2020-06-07 DIAGNOSIS — I10 Essential (primary) hypertension: Secondary | ICD-10-CM | POA: Diagnosis not present

## 2020-06-07 DIAGNOSIS — Z751 Person awaiting admission to adequate facility elsewhere: Secondary | ICD-10-CM

## 2020-06-07 DIAGNOSIS — S72002A Fracture of unspecified part of neck of left femur, initial encounter for closed fracture: Principal | ICD-10-CM | POA: Diagnosis present

## 2020-06-07 DIAGNOSIS — I420 Dilated cardiomyopathy: Secondary | ICD-10-CM | POA: Diagnosis not present

## 2020-06-07 DIAGNOSIS — G9389 Other specified disorders of brain: Secondary | ICD-10-CM | POA: Diagnosis not present

## 2020-06-07 DIAGNOSIS — Z471 Aftercare following joint replacement surgery: Secondary | ICD-10-CM | POA: Diagnosis not present

## 2020-06-07 DIAGNOSIS — Z7901 Long term (current) use of anticoagulants: Secondary | ICD-10-CM

## 2020-06-07 DIAGNOSIS — E785 Hyperlipidemia, unspecified: Secondary | ICD-10-CM | POA: Diagnosis present

## 2020-06-07 DIAGNOSIS — E669 Obesity, unspecified: Secondary | ICD-10-CM | POA: Diagnosis present

## 2020-06-07 DIAGNOSIS — R9431 Abnormal electrocardiogram [ECG] [EKG]: Secondary | ICD-10-CM | POA: Diagnosis not present

## 2020-06-07 DIAGNOSIS — I252 Old myocardial infarction: Secondary | ICD-10-CM

## 2020-06-07 DIAGNOSIS — Z20822 Contact with and (suspected) exposure to covid-19: Secondary | ICD-10-CM | POA: Diagnosis present

## 2020-06-07 DIAGNOSIS — Z8781 Personal history of (healed) traumatic fracture: Secondary | ICD-10-CM | POA: Diagnosis not present

## 2020-06-07 DIAGNOSIS — Z87442 Personal history of urinary calculi: Secondary | ICD-10-CM

## 2020-06-07 DIAGNOSIS — I350 Nonrheumatic aortic (valve) stenosis: Secondary | ICD-10-CM | POA: Diagnosis not present

## 2020-06-07 DIAGNOSIS — I517 Cardiomegaly: Secondary | ICD-10-CM | POA: Diagnosis not present

## 2020-06-07 DIAGNOSIS — I5033 Acute on chronic diastolic (congestive) heart failure: Secondary | ICD-10-CM | POA: Diagnosis not present

## 2020-06-07 DIAGNOSIS — N2581 Secondary hyperparathyroidism of renal origin: Secondary | ICD-10-CM | POA: Diagnosis present

## 2020-06-07 DIAGNOSIS — R0902 Hypoxemia: Secondary | ICD-10-CM | POA: Diagnosis not present

## 2020-06-07 DIAGNOSIS — Z221 Carrier of other intestinal infectious diseases: Secondary | ICD-10-CM

## 2020-06-07 DIAGNOSIS — I080 Rheumatic disorders of both mitral and aortic valves: Secondary | ICD-10-CM | POA: Diagnosis present

## 2020-06-07 DIAGNOSIS — K449 Diaphragmatic hernia without obstruction or gangrene: Secondary | ICD-10-CM | POA: Diagnosis not present

## 2020-06-07 DIAGNOSIS — Y92019 Unspecified place in single-family (private) house as the place of occurrence of the external cause: Secondary | ICD-10-CM

## 2020-06-07 DIAGNOSIS — Z9889 Other specified postprocedural states: Secondary | ICD-10-CM

## 2020-06-07 DIAGNOSIS — N186 End stage renal disease: Secondary | ICD-10-CM | POA: Diagnosis not present

## 2020-06-07 DIAGNOSIS — I480 Paroxysmal atrial fibrillation: Secondary | ICD-10-CM | POA: Diagnosis present

## 2020-06-07 DIAGNOSIS — Z96642 Presence of left artificial hip joint: Secondary | ICD-10-CM | POA: Diagnosis present

## 2020-06-07 DIAGNOSIS — E8889 Other specified metabolic disorders: Secondary | ICD-10-CM | POA: Diagnosis present

## 2020-06-07 DIAGNOSIS — I5043 Acute on chronic combined systolic (congestive) and diastolic (congestive) heart failure: Secondary | ICD-10-CM | POA: Diagnosis not present

## 2020-06-07 DIAGNOSIS — D631 Anemia in chronic kidney disease: Secondary | ICD-10-CM | POA: Diagnosis not present

## 2020-06-07 DIAGNOSIS — R52 Pain, unspecified: Secondary | ICD-10-CM | POA: Diagnosis not present

## 2020-06-07 DIAGNOSIS — Z6832 Body mass index (BMI) 32.0-32.9, adult: Secondary | ICD-10-CM

## 2020-06-07 DIAGNOSIS — M2550 Pain in unspecified joint: Secondary | ICD-10-CM | POA: Diagnosis not present

## 2020-06-07 DIAGNOSIS — Z992 Dependence on renal dialysis: Secondary | ICD-10-CM | POA: Diagnosis not present

## 2020-06-07 DIAGNOSIS — Z792 Long term (current) use of antibiotics: Secondary | ICD-10-CM

## 2020-06-07 DIAGNOSIS — Z79899 Other long term (current) drug therapy: Secondary | ICD-10-CM

## 2020-06-07 DIAGNOSIS — Z7401 Bed confinement status: Secondary | ICD-10-CM | POA: Diagnosis not present

## 2020-06-07 DIAGNOSIS — J9600 Acute respiratory failure, unspecified whether with hypoxia or hypercapnia: Secondary | ICD-10-CM | POA: Diagnosis not present

## 2020-06-07 DIAGNOSIS — J811 Chronic pulmonary edema: Secondary | ICD-10-CM | POA: Diagnosis not present

## 2020-06-07 DIAGNOSIS — S72009A Fracture of unspecified part of neck of unspecified femur, initial encounter for closed fracture: Secondary | ICD-10-CM | POA: Diagnosis not present

## 2020-06-07 DIAGNOSIS — Z87440 Personal history of urinary (tract) infections: Secondary | ICD-10-CM

## 2020-06-07 DIAGNOSIS — I4891 Unspecified atrial fibrillation: Secondary | ICD-10-CM | POA: Diagnosis not present

## 2020-06-07 DIAGNOSIS — I2510 Atherosclerotic heart disease of native coronary artery without angina pectoris: Secondary | ICD-10-CM | POA: Diagnosis present

## 2020-06-07 DIAGNOSIS — S0990XA Unspecified injury of head, initial encounter: Secondary | ICD-10-CM | POA: Diagnosis not present

## 2020-06-07 DIAGNOSIS — K219 Gastro-esophageal reflux disease without esophagitis: Secondary | ICD-10-CM | POA: Diagnosis present

## 2020-06-07 DIAGNOSIS — J9601 Acute respiratory failure with hypoxia: Secondary | ICD-10-CM | POA: Diagnosis present

## 2020-06-07 DIAGNOSIS — W19XXXA Unspecified fall, initial encounter: Secondary | ICD-10-CM | POA: Diagnosis not present

## 2020-06-07 DIAGNOSIS — Z01810 Encounter for preprocedural cardiovascular examination: Secondary | ICD-10-CM | POA: Diagnosis not present

## 2020-06-07 DIAGNOSIS — Z96652 Presence of left artificial knee joint: Secondary | ICD-10-CM | POA: Diagnosis present

## 2020-06-07 DIAGNOSIS — M7989 Other specified soft tissue disorders: Secondary | ICD-10-CM | POA: Diagnosis not present

## 2020-06-07 DIAGNOSIS — D72829 Elevated white blood cell count, unspecified: Secondary | ICD-10-CM

## 2020-06-07 DIAGNOSIS — S72002D Fracture of unspecified part of neck of left femur, subsequent encounter for closed fracture with routine healing: Secondary | ICD-10-CM | POA: Diagnosis not present

## 2020-06-07 DIAGNOSIS — G319 Degenerative disease of nervous system, unspecified: Secondary | ICD-10-CM | POA: Diagnosis not present

## 2020-06-07 DIAGNOSIS — I129 Hypertensive chronic kidney disease with stage 1 through stage 4 chronic kidney disease, or unspecified chronic kidney disease: Secondary | ICD-10-CM | POA: Diagnosis not present

## 2020-06-07 DIAGNOSIS — Z888 Allergy status to other drugs, medicaments and biological substances status: Secondary | ICD-10-CM

## 2020-06-07 LAB — CBC WITH DIFFERENTIAL/PLATELET
Abs Immature Granulocytes: 0.12 10*3/uL — ABNORMAL HIGH (ref 0.00–0.07)
Basophils Absolute: 0 10*3/uL (ref 0.0–0.1)
Basophils Relative: 0 %
Eosinophils Absolute: 0.1 10*3/uL (ref 0.0–0.5)
Eosinophils Relative: 1 %
HCT: 38.8 % (ref 36.0–46.0)
Hemoglobin: 11.7 g/dL — ABNORMAL LOW (ref 12.0–15.0)
Immature Granulocytes: 1 %
Lymphocytes Relative: 2 %
Lymphs Abs: 0.3 10*3/uL — ABNORMAL LOW (ref 0.7–4.0)
MCH: 29.7 pg (ref 26.0–34.0)
MCHC: 30.2 g/dL (ref 30.0–36.0)
MCV: 98.5 fL (ref 80.0–100.0)
Monocytes Absolute: 0.6 10*3/uL (ref 0.1–1.0)
Monocytes Relative: 4 %
Neutro Abs: 13.5 10*3/uL — ABNORMAL HIGH (ref 1.7–7.7)
Neutrophils Relative %: 92 %
Platelets: 230 10*3/uL (ref 150–400)
RBC: 3.94 MIL/uL (ref 3.87–5.11)
RDW: 17.9 % — ABNORMAL HIGH (ref 11.5–15.5)
WBC: 14.7 10*3/uL — ABNORMAL HIGH (ref 4.0–10.5)
nRBC: 0 % (ref 0.0–0.2)

## 2020-06-07 LAB — I-STAT CHEM 8, ED
BUN: 33 mg/dL — ABNORMAL HIGH (ref 8–23)
Calcium, Ion: 1.1 mmol/L — ABNORMAL LOW (ref 1.15–1.40)
Chloride: 97 mmol/L — ABNORMAL LOW (ref 98–111)
Creatinine, Ser: 5.3 mg/dL — ABNORMAL HIGH (ref 0.44–1.00)
Glucose, Bld: 116 mg/dL — ABNORMAL HIGH (ref 70–99)
HCT: 38 % (ref 36.0–46.0)
Hemoglobin: 12.9 g/dL (ref 12.0–15.0)
Potassium: 4.3 mmol/L (ref 3.5–5.1)
Sodium: 137 mmol/L (ref 135–145)
TCO2: 30 mmol/L (ref 22–32)

## 2020-06-07 LAB — COMPREHENSIVE METABOLIC PANEL
ALT: 17 U/L (ref 0–44)
AST: 21 U/L (ref 15–41)
Albumin: 3.3 g/dL — ABNORMAL LOW (ref 3.5–5.0)
Alkaline Phosphatase: 97 U/L (ref 38–126)
Anion gap: 15 (ref 5–15)
BUN: 31 mg/dL — ABNORMAL HIGH (ref 8–23)
CO2: 29 mmol/L (ref 22–32)
Calcium: 9.1 mg/dL (ref 8.9–10.3)
Chloride: 95 mmol/L — ABNORMAL LOW (ref 98–111)
Creatinine, Ser: 5.41 mg/dL — ABNORMAL HIGH (ref 0.44–1.00)
GFR, Estimated: 8 mL/min — ABNORMAL LOW (ref >60)
Glucose, Bld: 123 mg/dL — ABNORMAL HIGH (ref 70–99)
Potassium: 4.5 mmol/L (ref 3.5–5.1)
Sodium: 139 mmol/L (ref 135–145)
Total Bilirubin: 1.3 mg/dL — ABNORMAL HIGH (ref 0.3–1.2)
Total Protein: 6.3 g/dL — ABNORMAL LOW (ref 6.5–8.1)

## 2020-06-07 LAB — RESP PANEL BY RT-PCR (FLU A&B, COVID) ARPGX2
Influenza A by PCR: NEGATIVE
Influenza B by PCR: NEGATIVE
SARS Coronavirus 2 by RT PCR: NEGATIVE

## 2020-06-07 LAB — PROTIME-INR
INR: 2.4 — ABNORMAL HIGH (ref 0.8–1.2)
Prothrombin Time: 25 seconds — ABNORMAL HIGH (ref 11.4–15.2)

## 2020-06-07 MED ORDER — TIMOLOL MALEATE 0.5 % OP SOLN
1.0000 [drp] | Freq: Every day | OPHTHALMIC | Status: DC
  Administered 2020-06-08 – 2020-06-24 (×16): 1 [drp] via OPHTHALMIC
  Filled 2020-06-07 (×2): qty 5

## 2020-06-07 MED ORDER — AMIODARONE HCL 200 MG PO TABS
200.0000 mg | ORAL_TABLET | Freq: Every day | ORAL | Status: DC
  Administered 2020-06-08 – 2020-06-24 (×16): 200 mg via ORAL
  Filled 2020-06-07 (×17): qty 1

## 2020-06-07 MED ORDER — ONDANSETRON HCL 4 MG/2ML IJ SOLN
4.0000 mg | Freq: Once | INTRAMUSCULAR | Status: AC
  Administered 2020-06-07: 4 mg via INTRAVENOUS
  Filled 2020-06-07: qty 2

## 2020-06-07 MED ORDER — RENA-VITE PO TABS
1.0000 | ORAL_TABLET | Freq: Every day | ORAL | Status: DC
  Administered 2020-06-08 – 2020-06-23 (×15): 1 via ORAL
  Filled 2020-06-07 (×17): qty 1

## 2020-06-07 MED ORDER — HYDRALAZINE HCL 20 MG/ML IJ SOLN: 5.0000 mg | INTRAMUSCULAR | Status: DC | PRN

## 2020-06-07 MED ORDER — CALCIUM ACETATE (PHOS BINDER) 667 MG PO CAPS
1334.0000 mg | ORAL_CAPSULE | Freq: Three times a day (TID) | ORAL | Status: DC
  Administered 2020-06-09 – 2020-06-13 (×11): 1334 mg via ORAL
  Filled 2020-06-07 (×19): qty 2

## 2020-06-07 MED ORDER — HYDROMORPHONE HCL 1 MG/ML IJ SOLN
1.0000 mg | Freq: Once | INTRAMUSCULAR | Status: AC
  Administered 2020-06-07 (×2): 0.5 mg via INTRAVENOUS
  Filled 2020-06-07: qty 1

## 2020-06-07 MED ORDER — VITAMIN K1 10 MG/ML IJ SOLN
5.0000 mg | Freq: Once | INTRAVENOUS | Status: AC
  Administered 2020-06-07: 5 mg via INTRAVENOUS
  Filled 2020-06-07: qty 0.5

## 2020-06-07 MED ORDER — HYDROMORPHONE HCL 1 MG/ML IJ SOLN
0.5000 mg | INTRAMUSCULAR | Status: DC | PRN
  Administered 2020-06-07: 0.5 mg via INTRAVENOUS
  Filled 2020-06-07: qty 1

## 2020-06-07 MED ORDER — LATANOPROST 0.005 % OP SOLN
1.0000 [drp] | Freq: Every day | OPHTHALMIC | Status: DC
  Administered 2020-06-08 – 2020-06-23 (×17): 1 [drp] via OPHTHALMIC
  Filled 2020-06-07 (×2): qty 2.5

## 2020-06-07 MED ORDER — HYDROMORPHONE HCL 1 MG/ML IJ SOLN
0.5000 mg | INTRAMUSCULAR | Status: DC | PRN
  Administered 2020-06-08 – 2020-06-09 (×6): 0.5 mg via INTRAVENOUS
  Filled 2020-06-07 (×2): qty 0.5
  Filled 2020-06-07 (×3): qty 1
  Filled 2020-06-07: qty 0.5

## 2020-06-07 NOTE — ED Provider Notes (Signed)
[facility] EMERGENCY DEPARTMENT Provider Note   CSN: [id number] Arrival date & time: 06/07/20  2015     History No chief complaint on file.   [name] is a 74 y.o. female.  HPI Patient takes Coumadin for paroxysmal atrial fibrillation.  She reports she has not had any trouble with her heart for quite a while.  She had been out to dinner with family.  She came home and was closing some blinds.  She turned and tripped losing her balance.  She struck her head on the windowsill if she fell.  No loss of consciousness.  No headache.  She reports she fell onto her left hip.  She has severe pain in the left hip and cannot move it.  She denies any pain in her neck.  She denies any chest pain or difficulty breathing.  No abdominal pain.  Patient has Vicodin and Percocet listed as allergies.  She reports her allergy is nausea and vomiting.  Patient typically takes her Coumadin at night.  Her last dose was yesterday evening.  She ate shortly prior to arrival.  Patient has been on dialysis Tuesday Thursday Saturday for 5 years she reports she developed kidney failure after episode of sepsis and retained kidney stone.  Patient takes Coumadin for paroxysmal atrial fibrillation.  Family doctor is Dr. Lajean Manes.    At baseline, patient is active and living independently  No past medical history on file.  There are no problems to display for this patient.      OB History   No obstetric history on file.     No family history on file.     Home Medications Prior to Admission medications   Not on File    Allergies    Percocet [oxycodone-acetaminophen], Vicodin [hydrocodone-acetaminophen], and Chlorhexidine  Review of Systems   Review of Systems 10 systems reviewed and negative except as per HPI Physical Exam Updated Vital Signs BP (!) 175/82   Pulse 84   Temp (!) 97.5 F (36.4 C) (Oral)   Resp 16   Ht 5\' 2"  (1.575 m)   Wt 78.5 kg   SpO2 94%   BMI  31.64 kg/m   Physical Exam Constitutional:      Comments: Patient is alert and nontoxic.  No respiratory distress.  She is in severe pain.  HENT:     Head: Normocephalic and atraumatic.     Nose: Nose normal.     Mouth/Throat:     Mouth: Mucous membranes are moist.     Pharynx: Oropharynx is clear.  Eyes:     Extraocular Movements: Extraocular movements intact.  Neck:     Comments: No C-spine tenderness to palpation Cardiovascular:     Rate and Rhythm: Normal rate and regular rhythm.  Pulmonary:     Effort: Pulmonary effort is normal.     Breath sounds: Normal breath sounds.  Chest:     Chest wall: No tenderness.  Abdominal:     General: There is no distension.     Palpations: Abdomen is soft.     Tenderness: There is no abdominal tenderness. There is no guarding.  Musculoskeletal:     Comments: External rotation and shortening of the left leg.  Dorsalis pedis pulse 2+.  Severe pain with any movement of the leg peer  Skin:    General: Skin is warm and dry.  Neurological:     General: No focal deficit present.     Mental Status: She is  oriented to person, place, and time.     Coordination: Coordination normal.  Psychiatric:        Mood and Affect: Mood normal.     ED Results / Procedures / Treatments   Labs (all labs ordered are listed, but only abnormal results are displayed) Labs Reviewed  COMPREHENSIVE METABOLIC PANEL - Abnormal; Notable for the following components:      Result Value   Chloride 95 (*)    Glucose, Bld 123 (*)    BUN 31 (*)    Creatinine, Ser 5.41 (*)    Total Protein 6.3 (*)    Albumin 3.3 (*)    Total Bilirubin 1.3 (*)    GFR, Estimated 8 (*)    All other components within normal limits  CBC WITH DIFFERENTIAL/PLATELET - Abnormal; Notable for the following components:   WBC 14.7 (*)    Hemoglobin 11.7 (*)    RDW 17.9 (*)    Neutro Abs 13.5 (*)    Lymphs Abs 0.3 (*)    Abs Immature Granulocytes 0.12 (*)    All other components within  normal limits  PROTIME-INR - Abnormal; Notable for the following components:   Prothrombin Time 25.0 (*)    INR 2.4 (*)    All other components within normal limits  I-STAT CHEM 8, ED - Abnormal; Notable for the following components:   Chloride 97 (*)    BUN 33 (*)    Creatinine, Ser 5.30 (*)    Glucose, Bld 116 (*)    Calcium, Ion 1.10 (*)    All other components within normal limits  RESP PANEL BY RT-PCR (FLU A&B, COVID) [id number]    EKG EKG Interpretation  Date/Time:  Sunday June 07 2020 21:03:33 EST Ventricular Rate:  84 PR Interval:    QRS Duration: 138 QT Interval:  422 QTC Calculation: 499 R Axis:   93 Text Interpretation: Sinus rhythm Borderline prolonged PR interval Right bundle branch block agree, no old on file Confirmed by Charlesetta Shanks [phone]) on 06/07/2020 9:48:04 PM   Radiology CT Head Wo Contrast  Result Date: 06/07/2020 CLINICAL DATA:  Status post trauma. EXAM: CT HEAD WITHOUT CONTRAST TECHNIQUE: Contiguous axial images were obtained from the base of the skull through the vertex without intravenous contrast. COMPARISON:  April 11, 2012 FINDINGS: Brain: There is mild cerebral atrophy with widening of the extra-axial spaces and ventricular dilatation. There are areas of decreased attenuation within the white matter tracts of the supratentorial brain, consistent with microvascular disease changes. Vascular: No hyperdense vessel or unexpected calcification. Skull: Normal. Negative for fracture or focal lesion. Sinuses/Orbits: No acute finding. Other: None. IMPRESSION: No acute intracranial pathology. Electronically Signed   By: Virgina Norfolk M.D.   On: 06/07/2020 21:10   DG Pelvis Portable  Result Date: 06/07/2020 CLINICAL DATA:  Initial evaluation for acute trauma, fall. EXAM: PORTABLE PELVIS 1-2 VIEWS COMPARISON:  None. FINDINGS: Acute fracture extends through the left femoral neck with superior subluxation. Left femoral head remains normally position  within the acetabulum. Femoral head intact. No associated acetabular fracture. Bony pelvis intact. Limited views of the right hip demonstrate no acute osseous abnormality. No visible soft tissue injury. Underlying osteopenia. Mild degenerative changes noted within the lower lumbar spine. IMPRESSION: Acute fracture of the left femoral neck with superior subluxation. Electronically Signed   By: Jeannine Boga M.D.   On: 06/07/2020 20:44   DG Chest Portable 1 View  Result Date: 06/07/2020 CLINICAL DATA:  Initial evaluation for acute trauma, fall.  EXAM: PORTABLE CHEST 1 VIEW COMPARISON:  Prior radiograph from 04/23/2018. FINDINGS: Cardiomegaly. Mediastinal silhouette within normal limits. Moderate sized hiatal hernia noted. Lungs normally inflated. Perihilar vascular congestion without overt pulmonary edema. No visible pleural effusion. No focal infiltrates. No pneumothorax. There is question of focal osseous irregularity near the left coracoid process, favored to be secondary to patient positioning. No other acute osseous abnormality. Underlying osteopenia. IMPRESSION: 1. Question focal osseous irregularity near the left coracoid process, favored to be secondary to patient positioning. Correlation with physical exam recommended. Further assessment with dedicated radiograph of the left shoulder recommended as clinically warranted. 2. Cardiomegaly with perihilar vascular congestion without overt pulmonary edema. 3. Moderate sized hiatal hernia. Electronically Signed   By: Jeannine Boga M.D.   On: 06/07/2020 20:47    Procedures Procedures (including critical care time)  Medications Ordered in ED Medications  HYDROmorphone (DILAUDID) injection 0.5 mg (has no administration in time range)  HYDROmorphone (DILAUDID) injection 1 mg (0.5 mg Intravenous Given 06/07/20 2050)  ondansetron (ZOFRAN) injection 4 mg (4 mg Intravenous Given 06/07/20 2036)    ED Course  I have reviewed the triage vital  signs and the nursing notes.  Pertinent labs & imaging results that were available during my care of the patient were reviewed by me and considered in my medical decision making (see chart for details).    MDM Rules/Calculators/A&P                          Consult: Reviewed with Dr. Doreatha Martin orthopedics.  Request admission to hospitalist service.  Will anticipate surgical repair tomorrow.    Patient had a mechanical fall as outlined above.  Femoral neck fracture left hip.  No intracranial injury.  Patient otherwise alert and appropriate.  At this time, no signs of other significant injury.  At baseline, patient lives independently and is active.  She is on Coumadin.  She reports this is due to history of heart problems and atrial fibrillation.  She reports she is not had any problems with her heart for a long time.  Currently, patient is in sinus rhythm.  Patient had dialysis yesterday.  She is on a regular Tuesday Thursday Saturday schedule with [facility].  Patient is otherwise stable.  Plan to admit to hospitalist service for ongoing management by orthopedics and nephrology Final Clinical Impression(s) / ED Diagnoses Final diagnoses:  Closed left hip fracture, initial encounter ([facility])  ESRD (end stage renal disease) on dialysis [facility])  Anticoagulated    Rx / [facility] Orders ED Discharge Orders    None       Charlesetta Shanks, MD 06/07/20 2149

## 2020-06-07 NOTE — Progress Notes (Addendum)
Pt O2 sat 88.  Placed on 2L [place].

## 2020-06-07 NOTE — ED Notes (Signed)
Patient transported to CT 

## 2020-06-07 NOTE — Progress Notes (Signed)
Ortho Note  Received consult from Dr. Johnney Killian. 74 yo on coumadin with left femoral neck fracture. INR of 2.4. Will need hemi vs THA. I have reached out to Dr. Mardelle Matte to coordinate surgery tomorrow vs Tuesday. IV vitamin K ordered to be given tonight to correct INR for possible surgery tomorrow. NPO after midnight.  Shona Needles, MD [facility] [phone] (office) [email or web address]

## 2020-06-07 NOTE — H&P (Signed)
History and Physical    Audreyana Huntsberry QQI:[id number] DOB: 29-Apr-1946 DOA: 06/07/2020  PCP: No primary care provider on file. Patient coming from: Home  Chief Complaint: Fall  HPI: Correne Lalani is a 74 y.o. female with medical history significant of chronic combined systolic and diastolic CHF/dilated cardiomyopathy, moderate to severe aortic stenosis, CAD, paroxysmal A. fib on Coumadin, hypertension, hyperlipidemia, ESRD on HD TTS, GERD, migraine headaches, glaucoma of both eyes presenting to the ED via EMS with complaints of head injury and left hip pain after having a mechanical fall at home.  Patient states she was closing her blinds and when she turned around somehow lost her balance and fell.  She hit her head on the windowsill and fell on her left hip.  Since the fall she is having pain in her left hip.  Denies any neck pain.  Denies fevers, cough, or shortness of breath.  She has been vaccinated against Covid.  Reports bilateral lower extremity edema.  She does not use oxygen at home.  ED Course: Afebrile.  Not tachycardic.  Blood pressure elevated with systolic in the 941D to 408X.  SPO2 88% on room air, improved with 2 L supplemental oxygen.  WBC 14.7, hemoglobin 11.7 (baseline 9-10 range), platelet 230K.  Sodium 139, potassium 4.5, chloride 95, bicarb 29, BUN 31, creatinine 5.4, glucose 123.  INR 2.4.  Screening SARS-CoV-2 PCR test and influenza panel both negative.  X-ray of pelvis showing acute fracture of the left femoral neck with superior subluxation.  Chest x-ray showing questionable focal osseous irregularity near the left coracoid process, favored to be secondary to patient positioning.  Cardiomegaly with perihilar vascular congestion without overt pulmonary edema.  Moderate sized hiatal hernia.  Head CT negative for acute intracranial abnormality.  Orthopedics consulted and planning on taking the patient to the OR either tomorrow or Tuesday.  Recommending keeping patient n.p.o.  after midnight.  She was given IV vitamin K 5 mg for correction of INR.  ED provider also discussed the case with Dr. Jonnie Finner who recommended consulting daytime nephrology team for dialysis.  Other medications administered include IV Dilaudid and Zofran.  Review of Systems:  All systems reviewed and apart from history of presenting illness, are negative.  Past medical history: See HPI  Past surgical history: Please see duplicate chart under patient chart advisories.  Social history: No tobacco, ethanol, or illicit drug use.  Family history: Please see duplicate chart under patient chart advisories.  Allergies  Allergen Reactions  . Percocet [Oxycodone-Acetaminophen] Nausea And Vomiting  . Vicodin [Hydrocodone-Acetaminophen] Nausea And Vomiting  . Chlorhexidine Rash    Pt states she gets rash similar to "sunburn"    Prior to Admission medications   Medication Sig Start Date End Date Taking? Authorizing Provider  amiodarone (PACERONE) 200 MG tablet Take 200 mg by mouth daily. 03/21/20  Yes [provider]  bimatoprost (LUMIGAN) 0.01 % SOLN Place 1 drop into both eyes at bedtime.   Yes [provider]  calcium acetate (PHOSLO) 667 MG capsule Take 1,334 mg by mouth in the morning, at noon, in the evening, and at bedtime. 06/03/20  Yes [provider]  cephALEXin (KEFLEX) 250 MG capsule Take 250 mg by mouth daily. 05/24/20  Yes [provider]  diphenhydrAMINE (BENADRYL) 25 MG tablet Take 25 mg by mouth in the morning, at noon, and at bedtime.   Yes [provider]  lidocaine-prilocaine (EMLA) cream Apply 1 application topically 3 (three) times a week. 05/23/20  Yes [provider]  loperamide (IMODIUM A-D) 2 MG tablet Take 2 mg by mouth 4 (four) times daily as needed for diarrhea or loose stools.   Yes [provider]  multivitamin (RENA-VIT) TABS tablet Take 1 tablet by mouth daily. 05/11/20  Yes [provider]   saccharomyces boulardii (FLORASTOR) 250 MG capsule Take 250 mg by mouth daily.   Yes [provider]  timolol (BETIMOL) 0.5 % ophthalmic solution Place 1 drop into both eyes daily.   Yes [provider]  warfarin (COUMADIN) 5 MG tablet Take 2.5-5 mg by mouth as directed. Take 1 tablet (5 mg) on (Tues & Thurs) & Take 1/2 tablet (2.5 mg) on all other days 03/21/20  Yes [provider]    Physical Exam: Vitals:   06/07/20 2145 06/07/20 2200 06/07/20 2215 06/07/20 2230  BP: (!) 167/74 (!) 176/76 (!) 178/98 (!) 163/76  Pulse: 82 83 82 82  Resp: 14 15 12 16   Temp:      TempSrc:      SpO2: 94% 96% 95% 96%  Weight:      Height:        Physical Exam Constitutional:      General: She is not in acute distress. HENT:     Head: Normocephalic and atraumatic.  Eyes:     Extraocular Movements: Extraocular movements intact.     Conjunctiva/sclera: Conjunctivae normal.  Cardiovascular:     Rate and Rhythm: Normal rate and regular rhythm.     Pulses: Normal pulses.  Pulmonary:     Effort: Pulmonary effort is normal. No respiratory distress.     Breath sounds: Normal breath sounds. No wheezing or rales.  Abdominal:     General: Bowel sounds are normal. There is no distension.     Palpations: Abdomen is soft.     Tenderness: There is no abdominal tenderness.  Musculoskeletal:     Cervical back: Normal range of motion and neck supple.     Right lower leg: Edema present.     Left lower leg: Edema present.     Comments: +2 pitting edema of bilateral lower extremities Left lower extremity shortened and externally rotated Dorsalis pedis pulse intact bilaterally  Skin:    General: Skin is warm and dry.  Neurological:     Mental Status: She is alert and oriented to person, place, and time.     Labs on Admission: I have personally reviewed following labs and imaging studies  CBC: Recent Labs  Lab 06/07/20 2020 06/07/20 2046  WBC 14.7*  --   NEUTROABS 13.5*  --    HGB 11.7* 12.9  HCT 38.8 38.0  MCV 98.5  --   PLT 230  --    Basic Metabolic Panel: Recent Labs  Lab 06/07/20 2020 06/07/20 2046  NA 139 137  K 4.5 4.3  CL 95* 97*  CO2 29  --   GLUCOSE 123* 116*  BUN 31* 33*  CREATININE 5.41* 5.30*  CALCIUM 9.1  --    GFR: Estimated Creatinine Clearance: 8.9 mL/min (A) (by C-G formula based on SCr of 5.3 mg/dL (H)). Liver Function Tests: Recent Labs  Lab 06/07/20 2020  AST 21  ALT 17  ALKPHOS 97  BILITOT 1.3*  PROT 6.3*  ALBUMIN 3.3*   No results for input(s): LIPASE, AMYLASE in the last 168 hours. No results for input(s): AMMONIA in the last 168 hours. Coagulation Profile: Recent Labs  Lab 06/07/20 2020  INR 2.4*   Cardiac Enzymes: No results  for input(s): CKTOTAL, CKMB, CKMBINDEX, TROPONINI in the last 168 hours. BNP (last 3 results) No results for input(s): PROBNP in the last 8760 hours. HbA1C: No results for input(s): HGBA1C in the last 72 hours. CBG: No results for input(s): GLUCAP in the last 168 hours. Lipid Profile: No results for input(s): CHOL, HDL, LDLCALC, TRIG, CHOLHDL, LDLDIRECT in the last 72 hours. Thyroid Function Tests: No results for input(s): TSH, T4TOTAL, FREET4, T3FREE, THYROIDAB in the last 72 hours. Anemia Panel: No results for input(s): VITAMINB12, FOLATE, FERRITIN, TIBC, IRON, RETICCTPCT in the last 72 hours. Urine analysis: No results found for: COLORURINE, APPEARANCEUR, LABSPEC, PHURINE, GLUCOSEU, HGBUR, BILIRUBINUR, KETONESUR, PROTEINUR, UROBILINOGEN, NITRITE, LEUKOCYTESUR  Radiological Exams on Admission: CT Head Wo Contrast  Result Date: 06/07/2020 CLINICAL DATA:  Status post trauma. EXAM: CT HEAD WITHOUT CONTRAST TECHNIQUE: Contiguous axial images were obtained from the base of the skull through the vertex without intravenous contrast. COMPARISON:  April 11, 2012 FINDINGS: Brain: There is mild cerebral atrophy with widening of the extra-axial spaces and ventricular dilatation. There  are areas of decreased attenuation within the white matter tracts of the supratentorial brain, consistent with microvascular disease changes. Vascular: No hyperdense vessel or unexpected calcification. Skull: Normal. Negative for fracture or focal lesion. Sinuses/Orbits: No acute finding. Other: None. IMPRESSION: No acute intracranial pathology. Electronically Signed   By: Virgina Norfolk M.D.   On: 06/07/2020 21:10   DG Pelvis Portable  Result Date: 06/07/2020 CLINICAL DATA:  Initial evaluation for acute trauma, fall. EXAM: PORTABLE PELVIS 1-2 VIEWS COMPARISON:  None. FINDINGS: Acute fracture extends through the left femoral neck with superior subluxation. Left femoral head remains normally position within the acetabulum. Femoral head intact. No associated acetabular fracture. Bony pelvis intact. Limited views of the right hip demonstrate no acute osseous abnormality. No visible soft tissue injury. Underlying osteopenia. Mild degenerative changes noted within the lower lumbar spine. IMPRESSION: Acute fracture of the left femoral neck with superior subluxation. Electronically Signed   By: Jeannine Boga M.D.   On: 06/07/2020 20:44   DG Chest Portable 1 View  Result Date: 06/07/2020 CLINICAL DATA:  Initial evaluation for acute trauma, fall. EXAM: PORTABLE CHEST 1 VIEW COMPARISON:  Prior radiograph from 04/23/2018. FINDINGS: Cardiomegaly. Mediastinal silhouette within normal limits. Moderate sized hiatal hernia noted. Lungs normally inflated. Perihilar vascular congestion without overt pulmonary edema. No visible pleural effusion. No focal infiltrates. No pneumothorax. There is question of focal osseous irregularity near the left coracoid process, favored to be secondary to patient positioning. No other acute osseous abnormality. Underlying osteopenia. IMPRESSION: 1. Question focal osseous irregularity near the left coracoid process, favored to be secondary to patient positioning. Correlation with  physical exam recommended. Further assessment with dedicated radiograph of the left shoulder recommended as clinically warranted. 2. Cardiomegaly with perihilar vascular congestion without overt pulmonary edema. 3. Moderate sized hiatal hernia. Electronically Signed   By: Jeannine Boga M.D.   On: 06/07/2020 20:47    EKG: Independently reviewed.  Sinus rhythm, RBBB, QTC 499.  No significant change compared to prior tracing under duplicate chart.  Assessment/Plan Principal Problem:   Hip fracture (HCC) Active Problems:   Leukocytosis   Acute hypoxemic respiratory failure (HCC)   Atrial fibrillation (HCC)   ESRD (end stage renal disease) (HCC)   Acute left femoral neck fracture secondary to a mechanical fall: Seen on pelvic x-ray.  Orthopedics consulted by ED provider and planning on taking the patient to the OR either tomorrow or Tuesday.  Recommending keeping patient n.p.o.  after midnight.  She is on Coumadin for paroxysmal A. fib and INR currently 2.4.  She was given IV vitamin K 5 mg by orthopedics for correction of INR. -Keep n.p.o. after midnight.  Dilaudid as needed for pain.  Nonweightbearing.  Repeat PT/INR in a.m.  Acute hypoxemic respiratory failure secondary to volume overload in the setting of acute on chronic CHF, moderate to severe aortic stenosis, moderate to severe mitral regurgitation, and ESRD on HD TTS: SPO2 88% on room air, improved with 2 L supplemental oxygen.  SARS-CoV-2 PCR test and influenza panel both negative.  Chest x-ray showing cardiomegaly with perihilar vascular congestion without overt pulmonary edema.  Does appear volume overloaded with peripheral edema on exam.  Previous echo done in March 2021 showing normal LVEF of 81-19%, grade 2 diastolic dysfunction, moderate to severe AAS,and  moderate to severe MR. -ED provider discussed the case with Dr. Jonnie Finner who recommended consulting daytime nephrology team for dialysis. Continuous pulse ox, continue supplemental  oxygen.  Repeat echocardiogram ordered.  Monitor intake and output, daily weights, and dietary sodium plus fluid restriction when no longer n.p.o.  Leukocytosis: Possibly reactive.  Patient is afebrile and chest x-ray not suggestive of pneumonia.   -Patient is not anuric, UA ordered to rule out UTI.  Repeat CBC in a.m.  CAD: Stable.  Not endorsing any anginal symptoms at present.  Paroxysmal A. fib on Coumadin: Currently in sinus rhythm.  INR 2.4.  Patient was given IV vitamin K 5 mg orthopedics in preparation for hip surgery either tomorrow or Tuesday. -Hold Coumadin.  Monitor PT/INR.  Hypertension: Blood pressure elevated with systolic in the 147W to 295A.  Not on antihypertensives at home. -IV hydralazine PRN SBP >170.  Consult nephrology in a.m. for dialysis.  Chest x-ray abnormality: Chest x-ray showing questionable focal osseous irregularity near the left coracoid process, favored to be secondary to patient positioning.  Patient is not endorsing any shoulder pain or discomfort. -Continue to monitor  QT prolongation on EKG -Cardiac monitoring.  Monitor potassium and magnesium levels.  Avoid QT prolonging drugs if possible and repeat EKG in a.m.  DVT prophylaxis: SCDs Code Status: Patient wishes to be full code. Family Communication: Daughter at bedside. Disposition Plan: Status is: Inpatient  Remains inpatient appropriate because:Ongoing active pain requiring inpatient pain management, IV treatments appropriate due to intensity of illness or inability to take PO, Inpatient level of care appropriate due to severity of illness and Needs hip surgery   Dispo: The patient is from: Home              Anticipated d/c is to: SNF              Anticipated d/c date is: 3 days              Patient currently is not medically stable to d/c.  The medical decision making on this patient was of high complexity and the patient is at high risk for clinical deterioration, therefore this is a level 3  visit.  Shela Leff MD [facility]  If 7PM-7AM, please contact night-coverage [email or web address]  06/07/2020, 11:49 PM

## 2020-06-07 NOTE — Progress Notes (Signed)
   06/07/20 2005  Clinical Encounter Type  Visited With Patient not available  Visit Type Trauma  Referral From Nurse  Consult/Referral To Chaplain   Please contact chaplain if needed.This note was prepared by Jeanine Luz, M.Div..  For questions please contact by phone [phone].

## 2020-06-07 NOTE — ED Notes (Signed)
Pt placed on hospital bed. Ortho tech called to place traction.

## 2020-06-07 NOTE — ED Triage Notes (Addendum)
Pt bib EMS due to ground level fall at home. Pt tripped on cords and hit head on window seal. Pt takes warfarin. Pts hip also injured. 10/10 hip pain when mobile. EMS noted shortening lateral left legs.  50 fentanyl given Intranasally enroute  Vitals: BP: 186/86 HR: 84 RR: 18 O2: 89 2L

## 2020-06-07 NOTE — Progress Notes (Signed)
Pt to CT with this RN.

## 2020-06-08 ENCOUNTER — Encounter (HOSPITAL_COMMUNITY)
Admission: EM | Disposition: A | Discharge status: Skilled Nursing Facility | Payer: Self-pay | Source: Home / Self Care | Attending: Family Medicine

## 2020-06-08 ENCOUNTER — Inpatient Hospital Stay (HOSPITAL_COMMUNITY): Payer: Medicare Other

## 2020-06-08 DIAGNOSIS — S72002D Fracture of unspecified part of neck of left femur, subsequent encounter for closed fracture with routine healing: Secondary | ICD-10-CM

## 2020-06-08 DIAGNOSIS — Z992 Dependence on renal dialysis: Secondary | ICD-10-CM

## 2020-06-08 DIAGNOSIS — Z01810 Encounter for preprocedural cardiovascular examination: Secondary | ICD-10-CM

## 2020-06-08 DIAGNOSIS — I5033 Acute on chronic diastolic (congestive) heart failure: Secondary | ICD-10-CM

## 2020-06-08 DIAGNOSIS — N186 End stage renal disease: Secondary | ICD-10-CM

## 2020-06-08 DIAGNOSIS — I350 Nonrheumatic aortic (valve) stenosis: Secondary | ICD-10-CM

## 2020-06-08 DIAGNOSIS — I480 Paroxysmal atrial fibrillation: Secondary | ICD-10-CM

## 2020-06-08 DIAGNOSIS — J9601 Acute respiratory failure with hypoxia: Secondary | ICD-10-CM

## 2020-06-08 LAB — CBC
HCT: 36.9 % (ref 36.0–46.0)
Hemoglobin: 11 g/dL — ABNORMAL LOW (ref 12.0–15.0)
MCH: 29.6 pg (ref 26.0–34.0)
MCHC: 29.8 g/dL — ABNORMAL LOW (ref 30.0–36.0)
MCV: 99.5 fL (ref 80.0–100.0)
Platelets: 220 10*3/uL (ref 150–400)
RBC: 3.71 MIL/uL — ABNORMAL LOW (ref 3.87–5.11)
RDW: 17.7 % — ABNORMAL HIGH (ref 11.5–15.5)
WBC: 14 10*3/uL — ABNORMAL HIGH (ref 4.0–10.5)
nRBC: 0 % (ref 0.0–0.2)

## 2020-06-08 LAB — ECHOCARDIOGRAM COMPLETE
AR max vel: 0.71 cm2
AV Area VTI: 0.6 cm2
AV Area mean vel: 0.67 cm2
AV Mean grad: 38 mmHg
AV Peak grad: 66.9 mmHg
Ao pk vel: 4.09 m/s
Area-P 1/2: 3.53 cm2
Height: 62 in
MV M vel: 6.52 m/s
MV Peak grad: 170 mmHg
Radius: 0.5 cm
S' Lateral: 3.8 cm
Weight: 2768 oz

## 2020-06-08 LAB — RENAL FUNCTION PANEL
Albumin: 2.9 g/dL — ABNORMAL LOW (ref 3.5–5.0)
Anion gap: 15 (ref 5–15)
BUN: 41 mg/dL — ABNORMAL HIGH (ref 8–23)
CO2: 28 mmol/L (ref 22–32)
Calcium: 9 mg/dL (ref 8.9–10.3)
Chloride: 94 mmol/L — ABNORMAL LOW (ref 98–111)
Creatinine, Ser: 6.59 mg/dL — ABNORMAL HIGH (ref 0.44–1.00)
GFR, Estimated: 6 mL/min — ABNORMAL LOW (ref >60)
Glucose, Bld: 111 mg/dL — ABNORMAL HIGH (ref 70–99)
Phosphorus: 5 mg/dL — ABNORMAL HIGH (ref 2.5–4.6)
Potassium: 5.5 mmol/L — ABNORMAL HIGH (ref 3.5–5.1)
Sodium: 137 mmol/L (ref 135–145)

## 2020-06-08 LAB — BRAIN NATRIURETIC PEPTIDE: B Natriuretic Peptide: 2447.3 pg/mL — ABNORMAL HIGH (ref 0.0–100.0)

## 2020-06-08 LAB — GLUCOSE, CAPILLARY: Glucose-Capillary: 101 mg/dL — ABNORMAL HIGH (ref 70–99)

## 2020-06-08 LAB — PROTIME-INR
INR: 1.6 — ABNORMAL HIGH (ref 0.8–1.2)
Prothrombin Time: 18.7 seconds — ABNORMAL HIGH (ref 11.4–15.2)

## 2020-06-08 SURGERY — HEMIARTHROPLASTY, HIP, DIRECT ANTERIOR APPROACH, FOR FRACTURE
Anesthesia: General | Site: Hip | Laterality: Left

## 2020-06-08 MED ORDER — PENTAFLUOROPROP-TETRAFLUOROETH EX AERO: 1.0000 "application " | INHALATION_SPRAY | CUTANEOUS | Status: DC | PRN

## 2020-06-08 MED ORDER — HEPARIN SODIUM (PORCINE) 1000 UNIT/ML DIALYSIS: 20.0000 [IU]/kg | INTRAMUSCULAR | Status: DC | PRN

## 2020-06-08 MED ORDER — LIDOCAINE HCL (PF) 1 % IJ SOLN: 5.0000 mL | INTRAMUSCULAR | Status: DC | PRN

## 2020-06-08 MED ORDER — ALTEPLASE 2 MG IJ SOLR: 2.0000 mg | Freq: Once | INTRAMUSCULAR | Status: DC | PRN

## 2020-06-08 MED ORDER — SACCHAROMYCES BOULARDII 250 MG PO CAPS
250.0000 mg | ORAL_CAPSULE | Freq: Every day | ORAL | Status: DC
  Administered 2020-06-09 – 2020-06-24 (×16): 250 mg via ORAL
  Filled 2020-06-08 (×17): qty 1

## 2020-06-08 MED ORDER — SODIUM CHLORIDE 0.9 % IV SOLN: 100.0000 mL | INTRAVENOUS | Status: DC | PRN

## 2020-06-08 MED ORDER — ACETAMINOPHEN 325 MG PO TABS
650.0000 mg | ORAL_TABLET | Freq: Four times a day (QID) | ORAL | Status: DC | PRN
  Administered 2020-06-08 – 2020-06-24 (×25): 650 mg via ORAL
  Filled 2020-06-08 (×27): qty 2

## 2020-06-08 MED ORDER — DIPHENHYDRAMINE HCL 25 MG PO TABS
25.0000 mg | ORAL_TABLET | Freq: Three times a day (TID) | ORAL | Status: DC
  Filled 2020-06-08 (×2): qty 1

## 2020-06-08 MED ORDER — SODIUM CHLORIDE 0.9 % IV SOLN
100.0000 mg | INTRAVENOUS | Status: DC
  Administered 2020-06-08 – 2020-06-12 (×3): 100 mg via INTRAVENOUS
  Filled 2020-06-08 (×3): qty 5

## 2020-06-08 MED ORDER — PROCHLORPERAZINE EDISYLATE 10 MG/2ML IJ SOLN
5.0000 mg | Freq: Four times a day (QID) | INTRAMUSCULAR | Status: DC | PRN
  Administered 2020-06-08: 5 mg via INTRAVENOUS
  Filled 2020-06-08: qty 2

## 2020-06-08 MED ORDER — CEPHALEXIN 250 MG PO CAPS
250.0000 mg | ORAL_CAPSULE | Freq: Every day | ORAL | Status: DC
  Administered 2020-06-09: 250 mg via ORAL
  Filled 2020-06-08: qty 1

## 2020-06-08 MED ORDER — LIDOCAINE-PRILOCAINE 2.5-2.5 % EX CREA
1.0000 | TOPICAL_CREAM | CUTANEOUS | Status: DC | PRN
Start: 2020-06-08 — End: 2020-06-08

## 2020-06-08 NOTE — Consult Note (Signed)
Cardiology Consultation:   Patient ID: [name] MRN: [id number]; DOB: 22-Oct-1945  Admit date: 06/07/2020 Date of Consult: 06/08/2020  Primary Care Provider: No primary care provider on file. [facility] Cardiologist: Dr Irish Lack  Patient Profile:   [name] is a 74 y.o. female with a hx of paroxysmal atrial fibrillation who is being seen today for the preoperative evaluation prior to surgery for hip fracture at the request of Dr. Marchia Bond.  History of Present Illness:   [name] is a 74 year old female, who uses cane while ambulating out of her house, PMH of dilated cardiomyopathy with EF of 20-25% (EF has since normalized) in the setting of sepsis in 9983 complicated by demand ischemia, cardiac catheterization 2014 showed normal coronaries, chronic diastolic CHF, PAF on Coumadin anticoagulation, moderate to severe aortic stenosis, tolerated TKR in 5/21 well, per cardiology may need consideration of AVR at some point, hyperlipidemia, essential hypertension, ESRD on TTS HD, GERD, migraine and glaucoma presented to the ED following a mechanical fall at home with head injury and left hip pain.   She reportedly was trying to close her window blinds when she turned around, lost her balance, fell hit her head on the windowsill and landed on her left hip.  In the ED, hypoxic to 88% on room air and placed on oxygen.  CT head negative for acute abnormalities.  X-rays demonstrated acute left femoral neck fracture with superior subluxation.  Orthopedics consulted and plan surgical fixation either 12/20 or 12/21 pending hemodialysis needs by Nephrology and preop clearance by Cardiology.  The patient states that she has not had any chest pain, she can walk around her house with no problem, do all activities of daily living, she does not drive as she has problems with her knee however is able to walk around [place] without stopping because of shortness of breath.  No lower extremity edema.   No orthopnea or proximal nocturnal dyspnea.  No orthostatic hypotension and no syncope.  Diagnosis Date  . Arthritis of left knee   . Cardiomyopathy ([place])    a. h/o LV dysfunction EF 20-25% in 2013 due to sepsis.>> improved to normal   . Chronic diastolic CHF (congestive heart failure) ([place])    10/ 2013 in setting of septic shock  . Complication of anesthesia    use a little anesthesia , per patient MD states she quit breathing (2016); hard to wake up  . ESRD (end stage renal disease) ([place])    dialysis Tues, Thurs, Sat [address], sees dr deterding   . GERD (gastroesophageal reflux disease)   . Glaucoma    both eyes  . H/O hiatal hernia    a. CT 2017: large gastric hiatal hernia.  Marland Kitchen Headache(784.0)    migraine hx of  . History of blood transfusion 04/13/2015   . History of echocardiogram    a. Echo 6/17: EF 60-65%, normal wall motion, mild MR, atrial septal lipomatous hypertrophy, PASP 34 mmHg, possible trivial free-flowing pericardial effusion along RV free wall // b. Echo 5/17: Mild LVH, EF 55-60%, normal wall motion, grade 1 diastolic dysfunction, trivial MR, severe LAE, mild RAE, PASP 42 mmHg  . History of kidney stones    10/18/2019: per patient "has a couple currently one in each kidney"  . History of nephrostomy 04/11/2015   currently inplace 04/28/2015  removed now  . History of nuclear stress test    a. Myoview 1/14 - Marked ischemia in the basal anterior, mid anterior, apical septal and apical  inferior regions, EF 63% >> LHC normal   . Hyperlipidemia   . Hypertension    medication removed from regimen due to low blood pressure   . Iron deficiency    hx  . Myocardial infarction [facility]) 2013   10/18/2019: per patient "in 2013)  . Nephrolithiasis 2002, 2006   bilateral  . Normal coronary arteries 2014   a. LHC in 1/14: normal coronary arteries  . PAF (paroxysmal atrial fibrillation) ([place])    a. 10/ 2013  in setting of Septic Shock //  b.  recurrent during admit for pneumonia, L effusion >> placed on Amiodarone // Coumadin for anticoagulation  . Pneumonia jan 2018, last tme lungs clear now   dx 10-06-2014 per CXR--  on 10-27-2014 pt states finished antibiotic and denies cough or fever  . Primary localized osteoarthritis of right hip 10/22/2019  . S/P hemodialysis catheter insertion ([place] II) 04/11/2015    right anterior chest , only used once   . Sigmoid diverticulosis   . SOB (shortness of breath) on exertion    10/18/2019: per patient "get short of breath with activity sometimes due to heart valve"  . UTI (urinary tract infection) 05/10/2016         Past Surgical History:  Procedure Laterality Date  . AV FISTULA PLACEMENT Left 06/02/2015   Procedure: BRACHIOCEPHALIC ARTERIOVENOUS (AV) FISTULA CREATION ;  Surgeon: Conrad Earl Park, MD;  Location: [place];  Service: Vascular;  Laterality: Left;  . BASCILIC VEIN TRANSPOSITION Left 07/27/2015   Procedure: FIRST STAGE BASILIC VEIN TRANSPOSITION LEFT UPPER ARM;  Surgeon: Conrad Folsom, MD;  Location: [place];  Service: Vascular;  Laterality: Left;  . BASCILIC VEIN TRANSPOSITION Left 09/2015   second phase  . BASCILIC VEIN TRANSPOSITION Left 10/12/2015   Procedure: SECOND STAGE BASILIC VEIN TRANSPOSITION LEFT ARM;  Surgeon: Conrad Eagle Lake, MD;  Location: [place];  Service: Vascular;  Laterality: Left;  . BREAST BIOPSY Left 08/23/07   benign fibrocystic with duct ectasia  . CARDIAC CATHETERIZATION  07-11-2012  dr Irish Lack   Abnormal stress test/   normal coronary arteries/  LVEDP  52mmHg  . CARDIOVASCULAR STRESS TEST  06-26-2012  dr Irish Lack   marked ischemia in the basal anterior, mid anterior, apical inferior regions/  normal LVF, ef 63%  . CATARACT EXTRACTION W/ INTRAOCULAR LENS  IMPLANT, BILATERAL    . COLONOSCOPY WITH PROPOFOL N/A 10/17/2016   Procedure: COLONOSCOPY WITH PROPOFOL;  Surgeon: Garlan Fair, MD;  Location: [facility];  Service: Endoscopy;  Laterality: N/A;  .  CYSTOSCOPY W/ URETERAL STENT PLACEMENT  04/04/2012   Procedure: CYSTOSCOPY WITH RETROGRADE PYELOGRAM/URETERAL STENT PLACEMENT;  Surgeon: Ailene Rud, MD;  Location: [place];  Service: Urology;  Laterality: Left;  . CYSTOSCOPY W/ URETERAL STENT PLACEMENT Bilateral 05/04/2015   Procedure: CYSTOSCOPY WITH BILATERAL RETROGRADE PYELOGRAM/ WITH INTERPRETATION, EXCHANGE OF RIGHT URETERAL STENT REPLACEMENT AND PLACEMENT LEFT URETERAL STENT PLACEMENT EXAMINATION OF VAGINA;  Surgeon: Carolan Clines, MD;  Location: [facility];  Service: Urology;  Laterality: Bilateral;  . CYSTOSCOPY WITH STENT PLACEMENT Right 10/28/2014   Procedure: RIGHT URETERAL STENT PLACEMENT;  Surgeon: Irine Seal, MD;  Location: [facility];  Service: Urology;  Laterality: Right;  . CYSTOSCOPY WITH STENT PLACEMENT Right 02/26/2015   Procedure: CYSTOSCOPY RETROGRADE PYELOGRAM WITH STENT PLACEMENT;  Surgeon: Cleon Gustin, MD;  Location: [facility];  Service: Urology;  Laterality: Right;  . CYSTOSCOPY/RETROGRADE/URETEROSCOPY/STONE EXTRACTION WITH BASKET Right 11/21/2014   Procedure: CYSTOSCOPY/RIGHT RETROGRADE PYELOGRAM/RIGHT URETEROSCOPY/BASKET EXTRACTION/RIGHT PYELOSCOPY/LASER OF  STONE/RIGHT DOUBLE J STENT;  Surgeon: Carolan Clines, MD;  Location: [facility];  Service: Urology;  Laterality: Right;  . ESOPHAGOGASTRODUODENOSCOPY (EGD) WITH PROPOFOL Left 08/25/2018   Procedure: ESOPHAGOGASTRODUODENOSCOPY (EGD) WITH PROPOFOL;  Surgeon: Ronald Lobo, MD;  Location: [place];  Service: Endoscopy;  Laterality: Left;  . EXTRACORPOREAL SHOCK WAVE LITHOTRIPSY  05-28-2012  &  10-08-2012  . HOLMIUM LASER APPLICATION Right 3/0/1601   Procedure: HOLMIUM LASER APPLICATION;  Surgeon: Carolan Clines, MD;  Location: [facility];  Service: Urology;  Laterality: Right;  . IR GENERIC HISTORICAL  02/01/2016   IR NEPHROSTOMY EXCHANGE RIGHT 02/01/2016 Greggory Keen, MD [facility]  . IR  GENERIC HISTORICAL  02/24/2016   IR PATIENT EVAL TECH 0-60 MINS 02/24/2016 Aletta Edouard, MD [facility]  . KNEE ARTHROSCOPY Left 02-14-2003  . LAPAROSCOPIC CHOLECYSTECTOMY  03-23-2005  . TOTAL ABDOMINAL HYSTERECTOMY W/ BILATERAL SALPINGOOPHORECTOMY  1993   secondary to fibroids  . TOTAL KNEE ARTHROPLASTY Left 10/22/2019  . TOTAL KNEE ARTHROPLASTY Left 10/22/2019   Procedure: TOTAL KNEE ARTHROPLASTY;  Surgeon: Marchia Bond, MD;  Location: [place];  Service: Orthopedics;  Laterality: Left;  . TRANSTHORACIC ECHOCARDIOGRAM  04-09-2012   normal LVF,  ef 60-65%,  mild LAE,  mild TR, trivial MR and PR   Inpatient Medications: Scheduled Meds: . amiodarone  200 mg Oral Daily  . calcium acetate  1,334 mg Oral TID WC  . cephALEXin  250 mg Oral Daily  . latanoprost  1 drop Both Eyes QHS  . multivitamin  1 tablet Oral QHS  . saccharomyces boulardii  250 mg Oral Daily  . timolol  1 drop Both Eyes Daily   Continuous Infusions: . sodium chloride    . sodium chloride    . iron sucrose 100 mg (06/08/20 1741)   PRN Meds: sodium chloride, sodium chloride, acetaminophen, alteplase, heparin, hydrALAZINE, HYDROmorphone (DILAUDID) injection, lidocaine (PF), lidocaine-prilocaine, pentafluoroprop-tetrafluoroeth  Allergies:    Allergies  Allergen Reactions  . Percocet [Oxycodone-Acetaminophen] Nausea And Vomiting  . Vicodin [Hydrocodone-Acetaminophen] Nausea And Vomiting  . Chlorhexidine Rash    Pt states she gets rash similar to "sunburn"    Social History:   Social History   Socioeconomic History  . Marital status: Widowed    Spouse name: Not on file  . Number of children: Not on file  . Years of education: Not on file  . Highest education level: Not on file  Occupational History  . Not on file  Tobacco Use  . Smoking status: Not on file  . Smokeless tobacco: Not on file  Substance and Sexual Activity  . Alcohol use: Not on file  . Drug use: Not on file  . Sexual activity: Not  on file  Other Topics Concern  . Not on file  Social History Narrative  . Not on file   Social Determinants of Health   Financial Resource Strain: Not on file  Food Insecurity: Not on file  Transportation Needs: Not on file  Physical Activity: Not on file  Stress: Not on file  Social Connections: Not on file  Intimate Partner Violence: Not on file    Family History:   No family history on file.   ROS:  Please see the history of present illness.  All other ROS reviewed and negative.     Physical Exam/Data:   Vitals:   06/08/20 1810 06/08/20 1815 06/08/20 1820 06/08/20 1825  BP:   (!) 160/58   Pulse: 75 75 75 76  Resp: 17  15 19 16   Temp:   (!) 97.1 F (36.2 C)   TempSrc:   Oral   SpO2: 98% 96% 100% 100%  Weight:   77.2 kg   Height:        Intake/Output Summary (Last 24 hours) at 06/08/2020 1855 Last data filed at 06/08/2020 1820 Gross per 24 hour  Intake 50 ml  Output 800 ml  Net -750 ml   Last 3 Weights 06/08/2020 06/08/2020 06/07/2020  Weight (lbs) 170 lb 3.1 oz 171 lb 15.3 oz 173 lb  Weight (kg) 77.2 kg 78 kg 78.472 kg     Body mass index is 31.13 kg/m.  General:  Well nourished, well developed, in no acute distress, in HD HEENT: normal Lymph: no adenopathy Neck: no JVD Endocrine:  No thryomegaly Vascular: No carotid bruits; FA pulses 2+ bilaterally without bruits  Cardiac:  normal S1, S2; RRR; 4 out of 6 holosystolic murmur at the right upper sternal border, S2 present, 4 out of 6 systolic murmur at the left lower sternal border Lungs:  clear to auscultation bilaterally, no wheezing, rhonchi or rales  Abd: soft, nontender, no hepatomegaly  Ext: no edema Musculoskeletal:  No deformities, BUE and BLE strength normal and equal Skin: warm and dry  Neuro:  CNs 2-12 intact, no focal abnormalities noted Psych:  Normal affect   EKG:  The EKG was personally reviewed and demonstrates: Sinus rhythm and right bundle branch block Telemetry:  Telemetry was  personally reviewed and demonstrates: Sinus rhythm  Laboratory Data:  High Sensitivity Troponin:  No results for input(s): TROPONINIHS in the last 720 hours.   Chemistry Recent Labs  Lab 06/07/20 2020 06/07/20 2046 06/08/20 1456  NA 139 137 137  K 4.5 4.3 5.5*  CL 95* 97* 94*  CO2 29  --  28  GLUCOSE 123* 116* 111*  BUN 31* 33* 41*  CREATININE 5.41* 5.30* 6.59*  CALCIUM 9.1  --  9.0  GFRNONAA 8*  --  6*  ANIONGAP 15  --  15    Recent Labs  Lab 06/07/20 2020 06/08/20 1456  PROT 6.3*  --   ALBUMIN 3.3* 2.9*  AST 21  --   ALT 17  --   ALKPHOS 97  --   BILITOT 1.3*  --    Hematology Recent Labs  Lab 06/07/20 2020 06/07/20 2046 06/08/20 0525  WBC 14.7*  --  14.0*  RBC 3.94  --  3.71*  HGB 11.7* 12.9 11.0*  HCT 38.8 38.0 36.9  MCV 98.5  --  99.5  MCH 29.7  --  29.6  MCHC 30.2  --  29.8*  RDW 17.9*  --  17.7*  PLT 230  --  220   BNP Recent Labs  Lab 06/08/20 0525  BNP 2,447.3*    DDimer No results for input(s): DDIMER in the last 168 hours.  Radiology/Studies:  CT Head Wo Contrast  Result Date: 06/07/2020 CLINICAL DATA:  Status post trauma.  IMPRESSION: No acute intracranial pathology.   DG Pelvis Portable  Result Date: 06/07/2020 CLINICAL DATA:  Initial evaluation for acute trauma, fall. IMPRESSION: Acute fracture of the left femoral neck with superior subluxation. Electronically Signed   By: Jeannine Boga M.D.   On: 06/07/2020 20:44   DG Chest Portable 1 View  Result Date: 06/07/2020 CLINICAL DATA:  Initial evaluation for acute trauma, fall. IMPRESSION: 1. Question focal osseous irregularity near the left coracoid process, favored to be secondary to patient positioning. Correlation with physical exam recommended. Further  assessment with dedicated radiograph of the left shoulder recommended as clinically warranted. 2. Cardiomegaly with perihilar vascular congestion without overt pulmonary edema. 3. Moderate sized hiatal hernia. Electronically  Signed   By: Jeannine Boga M.D.   On: 06/07/2020 20:47   ECHOCARDIOGRAM COMPLETE 06/08/2020  1. Left ventricular ejection fraction, by estimation, is 50 to 55%. The  left ventricle has low normal function. The left ventricle has no regional  wall motion abnormalities. There is mild left ventricular hypertrophy.  Left ventricular diastolic  parameters are consistent with Grade II diastolic dysfunction  (pseudonormalization). Elevated left ventricular end-diastolic pressure.  The E/e' is 21.  2. Right ventricular systolic function is mildly reduced. The right  ventricular size is normal. There is moderately elevated pulmonary artery  systolic pressure. The estimated right ventricular systolic pressure is  38.3 mmHg.  3. Left atrial size was severely dilated.  4. Right atrial size was moderately dilated.  5. The mitral valve is abnormal. Mild to moderate mitral valve  regurgitation.  6. The tricuspid valve is abnormal. Tricuspid valve regurgitation is  moderate to severe.  7. The aortic valve is calcified. Aortic valve regurgitation is trivial.  Severe aortic valve stenosis. Aortic valve area, by VTI measures 0.60 cm.  Aortic valve mean gradient measures 38.0 mmHg. Aortic valve Vmax measures  4.09 m/s. Peak gradient 67  mmHg. DI is 0.21.  8. The inferior vena cava is dilated in size with >50% respiratory  variability, suggesting right atrial pressure of 8 mmHg.    Assessment and Plan:   1. Preoperative evaluation for intermediate risk surgery -hip replacement 1. The patient has cardiac conditions that place her to intermediate to high risk, especially moderate to severe aortic stenosis, however she is asymptomatic and can certainly achieve more than 4 METS.  She also has no active chest pain and no evidence for CHF.  She also has history of paroxysmal atrial fibrillation, she is currently in sinus rhythm, but because her CHA2DS2-VASc risk is 5 she will need to be bridged  in the perioperative.  There is no further work-up necessary prior to the surgery, we will be happy to monitor her in the perioperative.  If there are any further questions please do not hesitate to call us.   For questions or updates, please contact [place] Please consult [email or web address] for contact info under   Signed, Ena Dawley, MD  06/08/2020 6:55 PM

## 2020-06-08 NOTE — Consult Note (Signed)
ORTHOPAEDIC CONSULTATION  REQUESTING PHYSICIAN: Modena Jansky, MD  Chief Complaint: Left hip pain  HPI: Rya Rausch is a 74 y.o. female with history of chronic systolic and diastolic CHF/dilated cardiomyopathy, moderate to severe aortic stenosis, CAD, paroxysmal A. fib on Coumadin, hypertension, hyperlipidemia, ESRD on HD TTS, GERD, migraine headaches, glaucoma of both eyes, left knee TKA earlier this year who complains of left hip pain after a mechanical fall. Patient was adjusting the curtains last night and she turned around causing her to fall onto her left hip. She had sudden severe pain at her left hip and was unable to bear weight. EMS was called and she was brought to the ED. This morning patient states pain at left hip is severe, worse with movement and better with rest and pain medications. Denies chest pain, shortness of breath, vomiting, and abdominal pain. She has had some nausea since coming to the hospital. She does not walk with an assistive device at baseline.  Patient has another chart and MRN with history which will need to be merged.  No past medical history on file.  Social History   Socioeconomic History  . Marital status: Widowed    Spouse name: Not on file  . Number of children: Not on file  . Years of education: Not on file  . Highest education level: Not on file  Occupational History  . Not on file  Tobacco Use  . Smoking status: Not on file  . Smokeless tobacco: Not on file  Substance and Sexual Activity  . Alcohol use: Not on file  . Drug use: Not on file  . Sexual activity: Not on file  Other Topics Concern  . Not on file  Social History Narrative  . Not on file   Social Determinants of Health   Financial Resource Strain: Not on file  Food Insecurity: Not on file  Transportation Needs: Not on file  Physical Activity: Not on file  Stress: Not on file  Social Connections: Not on file   No family history on file. Allergies  Allergen  Reactions  . Percocet [Oxycodone-Acetaminophen] Nausea And Vomiting  . Vicodin [Hydrocodone-Acetaminophen] Nausea And Vomiting  . Chlorhexidine Rash    Pt states she gets rash similar to "sunburn"     Positive ROS: All other systems have been reviewed and were otherwise negative with the exception of those mentioned in the HPI and as above.  Physical Exam: General: Alert, laying in bed no acute distress Cardiovascular: + edema to bilateral lower extremities Respiratory: No cyanosis, no use of accessory musculature, on nasal canula. GI: No organomegaly, abdomen is soft and non-tender Skin: No lesions or ecchymosis noted at left lateral hip or groin Neurologic: Sensation intact distally Psychiatric: Patient is competent for consent with normal mood and affect Lymphatic: No axillary or cervical lymphadenopathy  MUSCULOSKELETAL: LLE - left leg shortened and externally rotated. Sensation intact to all aspects of left foot and lower leg. Significant hallux valgus deformity to first toe. Dorsiflexion and Plantarflexion intact with pain. 2+ DP pulse, foot warm and well perfused. No TTP at groin or lateral thigh.   Assessment/Plan: Left hip femoral neck fracture: - Plan for left hip hemiarthroplasty as definitive surgical treatment, patient had recent TKA which she has rehabed well - She has significant comorbidities as well is a tues, thurs, sat HD patient, plan is to have medicine, nephrology, and [place] cardiology see the patient prior to surgery - will plan for surgical intervention tomorrow to give time  for medical optimization prior to left hip hemiarthroplasty  Multiple comorbidities including acute hypoxemic respiratory failure secondary to volume overload, moderate-severe aortic stenosis, moderate to severe mitral regurgitation, ESRD on HD, Paroxysmal A. Fib on Coumadin, HTN, QT prolongation - per medicine  Ventura Bruns, PA-C    06/08/2020 7:56 AM

## 2020-06-08 NOTE — ED Notes (Signed)
Lunch tray delivered. Pt. Refused.

## 2020-06-08 NOTE — Progress Notes (Signed)
Left coracoid radiographic irregularity, unclear clinical significance, would not pursue further imaging now unless clinically warranted.  Will review further with the patient.   Awaiting medical optimization for surgery, today vs. Tomorrow.  Johnny Bridge, MD

## 2020-06-08 NOTE — Progress Notes (Signed)
PROGRESS NOTE   [name]  VFI:[id number]    DOB: 1946/06/01    DOA: 06/07/2020  PCP: No primary care provider on file.   I have briefly reviewed patients previous medical records in [facility].  Patient has an additional medical record with MRN number: [id number]   Chief complaints: Left hip pain following fall at home.  Brief Narrative:   74 year old female, lives with daughter, uses cane while ambulating out of her house, PMH of dilated cardiomyopathy with EF of 20-25% (EF has since normalized) in the setting of sepsis in 6010 complicated by demand ischemia, cardiac catheterization 2014 showed normal coronaries, chronic diastolic CHF, PAF on Coumadin anticoagulation, moderate to severe aortic stenosis, tolerated TKR in 5/21 well, per cardiology may need consideration of AVR at some point, hyperlipidemia, essential hypertension, ESRD on TTS HD, GERD, migraine and glaucoma presented to the ED following a mechanical fall at home with head injury and left hip pain.  She reportedly was trying to close her window blinds when she turned around, lost her balance, fell hit her head on the windowsill and landed on her left hip.  In the ED, hypoxic to 88% on room air and placed on oxygen.  CT head negative for acute abnormalities.  X-rays demonstrated acute left femoral neck fracture with superior subluxation.  Orthopedics consulted and plan surgical fixation either 12/20 or 12/21 pending hemodialysis needs by Nephrology and preop clearance by Cardiology.   Assessment & Plan:  Principal Problem:   Hip fracture ([place]) Active Problems:   Leukocytosis   Acute hypoxemic respiratory failure (HCC)   Atrial fibrillation (HCC)   ESRD (end stage renal disease) ([place])   Acute left hip femoral neck fracture:  Sustained post mechanical fall at home.  X-ray confirmed above fracture.  Orthopedic consultation appreciated.  Discussed with them.  Plan for left hip hemiarthroplasty pending Nephrology  evaluation for hemodialysis needs preop and preop clearance by Cardiology.  Patient is high risk given moderate to severe aortic stenosis, moderate to severe mitral regurgitation, chronic diastolic CHF in the context of ESRD on dialysis.  However she recently tolerated total knee arthroplasty in May 2021.  I have consulted Nephrology and Cardiology.  Acute respiratory failure with hypoxia:  May be related to acute on chronic diastolic CHF and patient with ESRD on hemodialysis.  Oxygen saturation 88% on room air on ED arrival.  Currently saturating in the mid 90s on 2 L/min supplemental oxygen.  Incentive spirometry.  Wean oxygen as tolerated for saturations >92%.  Acute on chronic diastolic CHF  TTE March 9323: LVEF 55-73%, grade 2 diastolic dysfunction, moderate to severe aortic stenosis and moderate to severe mitral regurgitation.  Lying comfortably supine in bed in the ED.  Does have leg edema.  Volume management across HD.  Nephrology consulted for same.  Cardiology consulted for assistance.  Moderate to severe aortic stenosis, moderate to severe mitral regurgitation  Places her at a high risk for perioperative CV complications.    However patient tolerated recent TKA without acute issues.  Avoid hypotension periprocedure.  Cardiology consulted and input pending.  ESRD on TTS hemodialysis  Last underwent dialysis on 06/06/2020.  Reports that she develops leg edema prior to next cycle of HD.  Nephrology consulted to see if she needs hemodialysis prior to surgery.  Paroxysmal atrial fibrillation:  Currently in sinus rhythm on telemetry.  Continue amiodarone  INR 2.4 on admission which was reversed with a dose of vitamin K 5 mg for upcoming surgery.  Repeat INR 1.6.  Orthopedics to advise timing of resumption of Coumadin postop.  Essential hypertension  Mildly uncontrolled.  Not on antihypertensives PTA.  Likely elevated due to pain.  Continue as needed  IV hydralazine.  Continue volume management across HD.  Anemia in ESRD  Stable.  Follow CBCs postop.  Leukocytosis  Likely stress demargination.  No clinical concern for infection.  Follow CBC.  Chest x-ray abnormality  Per chest x-ray 12/19: Question focal osseous irregularity near the left coracoid process, favored to be secondary to patient positioning.  Requested orthopedics to weigh in, if this needs any further evaluation.  Moderate-sized hiatal hernia/GERD  Does not appear to be on any medications at home.  Prolonged QTC  EKG 12/19: QTC 499 ms but in the context of RBBB morphology and hence not sure how accurate.  Minimize QT prolonging medications.  Periodically follow EKG for QTC     DVT prophylaxis: SCDs Start: 06/07/20 2354     Code Status: Full Code Family Communication: None at bedside Disposition:  Status is: Inpatient  Remains inpatient appropriate because:Inpatient level of care appropriate due to severity of illness   Dispo: The patient is from: Home              Anticipated d/c is to: SNF              Anticipated d/c date is: 3 days              Patient currently is not medically stable to d/c.        Consultants:   Orthopedics Nephrology Cardiology  Procedures:   None  Antimicrobials:    Anti-infectives (From admission, onward)   None        Subjective:  Patient seen this morning in the ED.  Reports no chest pain or dyspnea either at rest or with activity.  When she is outside her house, does ambulate with the help of cane and states that she can walk the length of her [employer] without chest pain or dyspnea.  Left lower extremity pain on movement.  Objective:   Vitals:   06/08/20 0800 06/08/20 0815 06/08/20 0836 06/08/20 1100  BP: (!) 166/72 (!) 154/66 (!) 157/88 (!) 165/66  Pulse: 79 77 76 72  Resp: (!) 21 15 11 16   Temp:   97.6 F (36.4 C)   TempSrc:   Oral   SpO2: 95% 95% 94% 96%  Weight:      Height:         General exam: Pleasant elderly female, moderately built and overweight lying comfortably supine in bed without distress. Respiratory system: Clear to auscultation. Respiratory effort normal. Cardiovascular system: S1 & S2 heard, RRR. No JVD.  Grade 3/6 pansystolic murmur best heard at the apex but radiating across the anterior chest wall.  2+ pitting right lower extremity edema greater than left.  Telemetry personally reviewed: Sinus rhythm. Gastrointestinal system: Abdomen is nondistended, soft and nontender. No organomegaly or masses felt. Normal bowel sounds heard. Central nervous system: Alert and oriented. No focal neurological deficits. Extremities: Symmetric 5 x 5 power.  Left lower extremity shortened and externally rotated.  Left upper arm with good aVF thrill. Skin: No rashes, lesions or ulcers Psychiatry: Judgement and insight appear normal. Mood & affect appropriate.     Data Reviewed:   I have personally reviewed following labs and imaging studies   CBC: Recent Labs  Lab 06/07/20 2020 06/07/20 2046 06/08/20 0525  WBC 14.7*  --  14.0*  NEUTROABS 13.5*  --   --   HGB 11.7* 12.9 11.0*  HCT 38.8 38.0 36.9  MCV 98.5  --  99.5  PLT 230  --  157    Basic Metabolic Panel: Recent Labs  Lab 06/07/20 2020 06/07/20 2046  NA 139 137  K 4.5 4.3  CL 95* 97*  CO2 29  --   GLUCOSE 123* 116*  BUN 31* 33*  CREATININE 5.41* 5.30*  CALCIUM 9.1  --     Liver Function Tests: Recent Labs  Lab 06/07/20 2020  AST 21  ALT 17  ALKPHOS 97  BILITOT 1.3*  PROT 6.3*  ALBUMIN 3.3*    CBG: No results for input(s): GLUCAP in the last 168 hours.  Microbiology Studies:   Recent Results (from the past 240 hour(s))  Resp Panel by RT-PCR (Flu A&B, Covid) Nasopharyngeal Swab     Status: None   Collection Time: 06/07/20  8:43 PM   Specimen: Nasopharyngeal Swab; Nasopharyngeal(NP) swabs in vial transport medium  Result Value Ref Range Status   SARS Coronavirus 2 by RT PCR  NEGATIVE NEGATIVE Final    Comment: (NOTE) SARS-CoV-2 target nucleic acids are NOT DETECTED.  The SARS-CoV-2 RNA is generally detectable in upper respiratory specimens during the acute phase of infection. The lowest concentration of SARS-CoV-2 viral copies this assay can detect is 138 copies/mL. A negative result does not preclude SARS-Cov-2 infection and should not be used as the sole basis for treatment or other patient management decisions. A negative result may occur with  improper specimen collection/handling, submission of specimen other than nasopharyngeal swab, presence of viral mutation(s) within the areas targeted by this assay, and inadequate number of viral copies(<138 copies/mL). A negative result must be combined with clinical observations, patient history, and epidemiological information. The expected result is Negative.  Fact Sheet for Patients:  EntrepreneurPulse.com.au  Fact Sheet for Healthcare Providers:  IncredibleEmployment.be  This test is no t yet approved or cleared by the [place] FDA and  has been authorized for detection and/or diagnosis of SARS-CoV-2 by FDA under an Emergency Use Authorization (EUA). This EUA will remain  in effect (meaning this test can be used) for the duration of the COVID-19 declaration under Section 564(b)(1) of the Act, 21 U.S.C.section 360bbb-3(b)(1), unless the authorization is terminated  or revoked sooner.       Influenza A by PCR NEGATIVE NEGATIVE Final   Influenza B by PCR NEGATIVE NEGATIVE Final    Comment: (NOTE) The Xpert Xpress SARS-CoV-2/FLU/RSV plus assay is intended as an aid in the diagnosis of influenza from Nasopharyngeal swab specimens and should not be used as a sole basis for treatment. Nasal washings and aspirates are unacceptable for Xpert Xpress SARS-CoV-2/FLU/RSV testing.  Fact Sheet for Patients: EntrepreneurPulse.com.au  Fact Sheet for  Healthcare Providers: IncredibleEmployment.be  This test is not yet approved or cleared by the [place] FDA and has been authorized for detection and/or diagnosis of SARS-CoV-2 by FDA under an Emergency Use Authorization (EUA). This EUA will remain in effect (meaning this test can be used) for the duration of the COVID-19 declaration under Section 564(b)(1) of the Act, 21 U.S.C. section 360bbb-3(b)(1), unless the authorization is terminated or revoked.  Performed at [facility], [place] [address], [place], [place] [address]      Radiology Studies:  CT Head Wo Contrast  Result Date: 06/07/2020 CLINICAL DATA:  Status post trauma. EXAM: CT HEAD WITHOUT CONTRAST TECHNIQUE: Contiguous axial images were obtained from  the base of the skull through the vertex without intravenous contrast. COMPARISON:  April 11, 2012 FINDINGS: Brain: There is mild cerebral atrophy with widening of the extra-axial spaces and ventricular dilatation. There are areas of decreased attenuation within the white matter tracts of the supratentorial brain, consistent with microvascular disease changes. Vascular: No hyperdense vessel or unexpected calcification. Skull: Normal. Negative for fracture or focal lesion. Sinuses/Orbits: No acute finding. Other: None. IMPRESSION: No acute intracranial pathology. Electronically Signed   By: Virgina Norfolk M.D.   On: 06/07/2020 21:10   DG Pelvis Portable  Result Date: 06/07/2020 CLINICAL DATA:  Initial evaluation for acute trauma, fall. EXAM: PORTABLE PELVIS 1-2 VIEWS COMPARISON:  None. FINDINGS: Acute fracture extends through the left femoral neck with superior subluxation. Left femoral head remains normally position within the acetabulum. Femoral head intact. No associated acetabular fracture. Bony pelvis intact. Limited views of the right hip demonstrate no acute osseous abnormality. No visible soft tissue injury. Underlying osteopenia. Mild  degenerative changes noted within the lower lumbar spine. IMPRESSION: Acute fracture of the left femoral neck with superior subluxation. Electronically Signed   By: Jeannine Boga M.D.   On: 06/07/2020 20:44   DG Chest Portable 1 View  Result Date: 06/07/2020 CLINICAL DATA:  Initial evaluation for acute trauma, fall. EXAM: PORTABLE CHEST 1 VIEW COMPARISON:  Prior radiograph from 04/23/2018. FINDINGS: Cardiomegaly. Mediastinal silhouette within normal limits. Moderate sized hiatal hernia noted. Lungs normally inflated. Perihilar vascular congestion without overt pulmonary edema. No visible pleural effusion. No focal infiltrates. No pneumothorax. There is question of focal osseous irregularity near the left coracoid process, favored to be secondary to patient positioning. No other acute osseous abnormality. Underlying osteopenia. IMPRESSION: 1. Question focal osseous irregularity near the left coracoid process, favored to be secondary to patient positioning. Correlation with physical exam recommended. Further assessment with dedicated radiograph of the left shoulder recommended as clinically warranted. 2. Cardiomegaly with perihilar vascular congestion without overt pulmonary edema. 3. Moderate sized hiatal hernia. Electronically Signed   By: Jeannine Boga M.D.   On: 06/07/2020 20:47     Scheduled Meds:   . amiodarone  200 mg Oral Daily  . calcium acetate  1,334 mg Oral TID WC  . latanoprost  1 drop Both Eyes QHS  . multivitamin  1 tablet Oral QHS  . timolol  1 drop Both Eyes Daily    Continuous Infusions:     LOS: 1 day     Vernell Leep, MD, [place], [facility]. [facility]    To contact the attending provider between 7A-7P or the covering provider during after hours 7P-7A, please log into the web site [email or web address] and access using universal  password for that web site. If you do not have the password, please call the hospital operator.  06/08/2020, 11:59 AM

## 2020-06-08 NOTE — Plan of Care (Signed)
  Problem: Education: Goal: Knowledge of General Education information will improve Description: Including pain rating scale, medication(s)/side effects and non-pharmacologic comfort measures Outcome: Progressing   Problem: Health Behavior/Discharge Planning: Goal: Ability to manage health-related needs will improve Outcome: Progressing   Problem: Activity: Goal: Risk for activity intolerance will decrease Outcome: Progressing   Problem: Coping: Goal: Level of anxiety will decrease Outcome: Progressing   Problem: Elimination: Goal: Will not experience complications related to bowel motility Outcome: Progressing   Problem: Pain Managment: Goal: General experience of comfort will improve Outcome: Progressing   Problem: Safety: Goal: Ability to remain free from injury will improve Outcome: Progressing   Problem: Skin Integrity: Goal: Risk for impaired skin integrity will decrease Outcome: Progressing   

## 2020-06-08 NOTE — Progress Notes (Signed)
Patient ate crackers at approximately 1 pm today, will plan to allow her to eat today. Cardiology has been consulted for cardiac clearance prior to surgery. Will plan to be NPO after midnight for planned left hip hemiarthroplasty tomorrow afternoon.

## 2020-06-08 NOTE — Consult Note (Addendum)
[facility] Renal Consultation Note    Indication for Consultation:  Management of ESRD/hemodialysis; anemia, hypertension/volume and secondary hyperparathyroidism PCP: No PCP on file  HPI: [name] is a 74 y.o. female with ESRD on hemodialysis T,Th,S at [facility]. PMH: Combined systolic and diastolic HF, PAF on coumadin, moderate to severe AS, GERD, HLD,glaucoma, anemia of chronic anemia, SHPT. She is compliant with hemodialysis prescription. Last HD 06/06/2020, left at OP EDW.   Patient had mechanical fall at home 06/07/2020 while closing blinds and fell on R hip, struck head on window sill. She was brought to ED via EMS for evaluation of L hip pain. CT of head unremarkable. Xray of pelvis revealed Acute fracture extends through the left femoral neck with superior subluxation. Orthopedics have been consulted. Plans for left hip hemiarthroplasty either today or tomorrow pending cardiology clearance. Noted to have O2 sats 88% on RA upon arrival to ED, improved with O2 via [place]. CXR done-Cardiomegaly with perihilar vascular congestion without overt pulmonary edema. Labs unremarkable for ESRD patient.   She is being seen in ED. She has external rotation and shortening of LLE. C/O pain in hip but otherwise appears stable. She agrees to plans for HD off schedule today in order to facilitate L hip repair.      Social History:  has no history on file for tobacco use, alcohol use, and drug use. Allergies  Allergen Reactions  . Percocet [Oxycodone-Acetaminophen] Nausea And Vomiting  . Vicodin [Hydrocodone-Acetaminophen] Nausea And Vomiting  . Chlorhexidine Rash    Pt states she gets rash similar to "sunburn"   Prior to Admission medications   Medication Sig Start Date End Date Taking? Authorizing Provider  amiodarone (PACERONE) 200 MG tablet Take 200 mg by mouth daily. 03/21/20  Yes [provider]  bimatoprost (LUMIGAN) 0.01 % SOLN Place 1 drop into both  eyes at bedtime.   Yes [provider]  calcium acetate (PHOSLO) 667 MG capsule Take 1,334 mg by mouth in the morning, at noon, in the evening, and at bedtime. 06/03/20  Yes [provider]  cephALEXin (KEFLEX) 250 MG capsule Take 250 mg by mouth daily. 05/24/20  Yes [provider]  diphenhydrAMINE (BENADRYL) 25 MG tablet Take 25 mg by mouth in the morning, at noon, and at bedtime.   Yes [provider]  lidocaine-prilocaine (EMLA) cream Apply 1 application topically 3 (three) times a week. 05/23/20  Yes [provider]  loperamide (IMODIUM A-D) 2 MG tablet Take 2 mg by mouth 4 (four) times daily as needed for diarrhea or loose stools.   Yes [provider]  multivitamin (RENA-VIT) TABS tablet Take 1 tablet by mouth daily. 05/11/20  Yes [provider]  saccharomyces boulardii (FLORASTOR) 250 MG capsule Take 250 mg by mouth daily.   Yes [provider]  timolol (BETIMOL) 0.5 % ophthalmic solution Place 1 drop into both eyes daily.   Yes [provider]  warfarin (COUMADIN) 5 MG tablet Take 2.5-5 mg by mouth as directed. Take 1 tablet (5 mg) on (Tues & Thurs) & Take 1/2 tablet (2.5 mg) on all other days 03/21/20  Yes [provider]   Current Facility-Administered Medications  Medication Dose Route Frequency Provider Last Rate Last Admin  . 0.9 %  sodium chloride infusion  100 mL Intravenous PRN Valentina Gu, NP      . 0.9 %  sodium chloride infusion  100 mL Intravenous PRN Valentina Gu, NP      .  acetaminophen (TYLENOL) tablet 650 mg  650 mg Oral Q6H PRN Modena Jansky, MD   650 mg at 06/08/20 1345  . alteplase (CATHFLO ACTIVASE) injection 2 mg  2 mg Intracatheter Once PRN Valentina Gu, NP      . amiodarone (PACERONE) tablet 200 mg  200 mg Oral Daily Shela Leff, MD   200 mg at 06/08/20 0934  . calcium acetate (PHOSLO) capsule 1,334 mg  1,334 mg Oral TID WC Shela Leff,  MD      . cephALEXin (KEFLEX) capsule 250 mg  250 mg Oral Daily Hongalgi, Anand D, MD      . diphenhydrAMINE (BENADRYL) tablet 25 mg  25 mg Oral TID Modena Jansky, MD      . heparin injection 1,600 Units  20 Units/kg Dialysis PRN Valentina Gu, NP      . hydrALAZINE (APRESOLINE) injection 5 mg  5 mg Intravenous Q4H PRN Shela Leff, MD      . HYDROmorphone (DILAUDID) injection 0.5 mg  0.5 mg Intravenous Q3H PRN Shela Leff, MD   0.5 mg at 06/08/20 1106  . latanoprost (XALATAN) 0.005 % ophthalmic solution 1 drop  1 drop Both Eyes QHS Shela Leff, MD   1 drop at 06/08/20 0138  . lidocaine (PF) (XYLOCAINE) 1 % injection 5 mL  5 mL Intradermal PRN Valentina Gu, NP      . lidocaine-prilocaine (EMLA) cream 1 application  1 application Topical PRN Valentina Gu, NP      . multivitamin (RENA-VIT) tablet 1 tablet  1 tablet Oral QHS Shela Leff, MD      . pentafluoroprop-tetrafluoroeth (GEBAUERS) aerosol 1 application  1 application Topical PRN Valentina Gu, NP      . prochlorperazine (COMPAZINE) injection 5 mg  5 mg Intravenous Q6H PRN Shela Leff, MD   5 mg at 06/08/20 0138  . saccharomyces boulardii (FLORASTOR) capsule 250 mg  250 mg Oral Daily Hongalgi, Anand D, MD      . timolol (TIMOPTIC) 0.5 % ophthalmic solution 1 drop  1 drop Both Eyes Daily Shela Leff, MD   1 drop at 06/08/20 0934   Labs: Basic Metabolic Panel: Recent Labs  Lab 06/07/20 2020 06/07/20 2046  NA 139 137  K 4.5 4.3  CL 95* 97*  CO2 29  --   GLUCOSE 123* 116*  BUN 31* 33*  CREATININE 5.41* 5.30*  CALCIUM 9.1  --    Liver Function Tests: Recent Labs  Lab 06/07/20 2020  AST 21  ALT 17  ALKPHOS 97  BILITOT 1.3*  PROT 6.3*  ALBUMIN 3.3*   No results for input(s): LIPASE, AMYLASE in the last 168 hours. No results for input(s): AMMONIA in the last 168 hours. CBC: Recent Labs  Lab 06/07/20 2020 06/07/20 2046 06/08/20 0525  WBC 14.7*  --   14.0*  NEUTROABS 13.5*  --   --   HGB 11.7* 12.9 11.0*  HCT 38.8 38.0 36.9  MCV 98.5  --  99.5  PLT 230  --  220   Cardiac Enzymes: No results for input(s): CKTOTAL, CKMB, CKMBINDEX, TROPONINI in the last 168 hours. CBG: No results for input(s): GLUCAP in the last 168 hours. Iron Studies: No results for input(s): IRON, TIBC, TRANSFERRIN, FERRITIN in the last 72 hours. Studies/Results: CT Head Wo Contrast  Result Date: 06/07/2020 CLINICAL DATA:  Status post trauma. EXAM: CT HEAD WITHOUT CONTRAST TECHNIQUE: Contiguous axial images were obtained from the base of the skull through the vertex  without intravenous contrast. COMPARISON:  April 11, 2012 FINDINGS: Brain: There is mild cerebral atrophy with widening of the extra-axial spaces and ventricular dilatation. There are areas of decreased attenuation within the white matter tracts of the supratentorial brain, consistent with microvascular disease changes. Vascular: No hyperdense vessel or unexpected calcification. Skull: Normal. Negative for fracture or focal lesion. Sinuses/Orbits: No acute finding. Other: None. IMPRESSION: No acute intracranial pathology. Electronically Signed   By: Virgina Norfolk M.D.   On: 06/07/2020 21:10   DG Pelvis Portable  Result Date: 06/07/2020 CLINICAL DATA:  Initial evaluation for acute trauma, fall. EXAM: PORTABLE PELVIS 1-2 VIEWS COMPARISON:  None. FINDINGS: Acute fracture extends through the left femoral neck with superior subluxation. Left femoral head remains normally position within the acetabulum. Femoral head intact. No associated acetabular fracture. Bony pelvis intact. Limited views of the right hip demonstrate no acute osseous abnormality. No visible soft tissue injury. Underlying osteopenia. Mild degenerative changes noted within the lower lumbar spine. IMPRESSION: Acute fracture of the left femoral neck with superior subluxation. Electronically Signed   By: Jeannine Boga M.D.   On: 06/07/2020  20:44   DG Chest Portable 1 View  Result Date: 06/07/2020 CLINICAL DATA:  Initial evaluation for acute trauma, fall. EXAM: PORTABLE CHEST 1 VIEW COMPARISON:  Prior radiograph from 04/23/2018. FINDINGS: Cardiomegaly. Mediastinal silhouette within normal limits. Moderate sized hiatal hernia noted. Lungs normally inflated. Perihilar vascular congestion without overt pulmonary edema. No visible pleural effusion. No focal infiltrates. No pneumothorax. There is question of focal osseous irregularity near the left coracoid process, favored to be secondary to patient positioning. No other acute osseous abnormality. Underlying osteopenia. IMPRESSION: 1. Question focal osseous irregularity near the left coracoid process, favored to be secondary to patient positioning. Correlation with physical exam recommended. Further assessment with dedicated radiograph of the left shoulder recommended as clinically warranted. 2. Cardiomegaly with perihilar vascular congestion without overt pulmonary edema. 3. Moderate sized hiatal hernia. Electronically Signed   By: Jeannine Boga M.D.   On: 06/07/2020 20:47   ECHOCARDIOGRAM COMPLETE  Result Date: 06/08/2020    ECHOCARDIOGRAM REPORT   Patient Name:   JAMYA STARRY Date of Exam: 06/08/2020 Medical Rec #:  [id number]     Height:       62.0 in Accession #:    [id number]    Weight:       173.0 lb Date of Birth:  [birth year]      BSA:          1.797 m Patient Age:    34 years      BP:           143/66 mmHg Patient Gender: F             HR:           73 bpm. Exam Location:  Inpatient Procedure: 2D Echo, Cardiac Doppler and Color Doppler Indications:    I50.33 Acute on chronic diastolic (congestive) heart failure  History:        Patient has no prior history of Echocardiogram examinations.  Sonographer:    Jonelle Sidle Dance Referring Phys: [id number] [place]  1. Left ventricular ejection fraction, by estimation, is 50 to 55%. The left ventricle has low normal function.  The left ventricle has no regional wall motion abnormalities. There is mild left ventricular hypertrophy. Left ventricular diastolic parameters are consistent with Grade II diastolic dysfunction (pseudonormalization). Elevated left ventricular end-diastolic pressure. The E/e' is 21.  2. Right ventricular systolic function is  mildly reduced. The right ventricular size is normal. There is moderately elevated pulmonary artery systolic pressure. The estimated right ventricular systolic pressure is 17.6 mmHg.  3. Left atrial size was severely dilated.  4. Right atrial size was moderately dilated.  5. The mitral valve is abnormal. Mild to moderate mitral valve regurgitation.  6. The tricuspid valve is abnormal. Tricuspid valve regurgitation is moderate to severe.  7. The aortic valve is calcified. Aortic valve regurgitation is trivial. Severe aortic valve stenosis. Aortic valve area, by VTI measures 0.60 cm. Aortic valve mean gradient measures 38.0 mmHg. Aortic valve Vmax measures 4.09 m/s. Peak gradient 67 mmHg. DI is 0.21.  8. The inferior vena cava is dilated in size with >50% respiratory variability, suggesting right atrial pressure of 8 mmHg. Comparison(s): No prior Echocardiogram. Conclusion(s)/Recommendation(s): Findings concerning for congestive heart failure and severe aortic stenosis, would recommend structural heart team evaluation. FINDINGS  Left Ventricle: Left ventricular ejection fraction, by estimation, is 50 to 55%. The left ventricle has low normal function. The left ventricle has no regional wall motion abnormalities. The left ventricular internal cavity size was normal in size. There is mild left ventricular hypertrophy. Left ventricular diastolic parameters are consistent with Grade II diastolic dysfunction (pseudonormalization). Elevated left ventricular end-diastolic pressure. The E/e' is 21. Right Ventricle: The right ventricular size is normal. No increase in right ventricular wall thickness.  Right ventricular systolic function is mildly reduced. There is moderately elevated pulmonary artery systolic pressure. The tricuspid regurgitant velocity is 3.32 m/s, and with an assumed right atrial pressure of 8 mmHg, the estimated right ventricular systolic pressure is 16.0 mmHg. Left Atrium: Left atrial size was severely dilated. Right Atrium: Right atrial size was moderately dilated. Pericardium: There is no evidence of pericardial effusion. Mitral Valve: The mitral valve is abnormal. Moderately decreased mobility of the posterior mitral valve leaflet. Mild to moderate mitral annular calcification. Mild to moderate mitral valve regurgitation. Tricuspid Valve: The tricuspid valve is abnormal. Tricuspid valve regurgitation is moderate to severe. Aortic Valve: The aortic valve is calcified. Aortic valve regurgitation is trivial. Severe aortic stenosis is present. Aortic valve mean gradient measures 38.0 mmHg. Aortic valve peak gradient measures 66.9 mmHg. Aortic valve area, by VTI measures 0.60 cm. Pulmonic Valve: The pulmonic valve was grossly normal. Pulmonic valve regurgitation is trivial. Aorta: The aortic root and ascending aorta are structurally normal, with no evidence of dilitation. Venous: The inferior vena cava is dilated in size with greater than 50% respiratory variability, suggesting right atrial pressure of 8 mmHg. IAS/Shunts: No atrial level shunt detected by color flow Doppler.  LEFT VENTRICLE PLAX 2D LVIDd:         4.60 cm  Diastology LVIDs:         3.80 cm  LV e' medial:    5.22 cm/s LV PW:         1.30 cm  LV E/e' medial:  25.9 LV IVS:        1.20 cm  LV e' lateral:   8.05 cm/s LVOT diam:     1.90 cm  LV E/e' lateral: 16.8 LV SV:         62 LV SV Index:   35 LVOT Area:     2.84 cm  RIGHT VENTRICLE            IVC RV Basal diam:  3.90 cm    IVC diam: 2.30 cm RV Mid diam:    2.50 cm RV S prime:     7.40  cm/s TAPSE (M-mode): 1.8 cm LEFT ATRIUM              Index       RIGHT ATRIUM           Index  LA diam:        5.30 cm  2.95 cm/m  RA Area:     22.30 cm LA Vol (A2C):   119.0 ml 66.21 ml/m RA Volume:   70.40 ml  39.17 ml/m LA Vol (A4C):   112.0 ml 62.31 ml/m LA Biplane Vol: 117.0 ml 65.09 ml/m  AORTIC VALVE AV Area (Vmax):    0.71 cm AV Area (Vmean):   0.67 cm AV Area (VTI):     0.60 cm AV Vmax:           409.00 cm/s AV Vmean:          284.250 cm/s AV VTI:            1.030 m AV Peak Grad:      66.9 mmHg AV Mean Grad:      38.0 mmHg LVOT Vmax:         103.00 cm/s LVOT Vmean:        67.100 cm/s LVOT VTI:          0.219 m LVOT/AV VTI ratio: 0.21  AORTA Ao Root diam: 3.00 cm Ao Asc diam:  3.20 cm MITRAL VALVE                 TRICUSPID VALVE MV Area (PHT): 3.53 cm      TR Peak grad:   44.1 mmHg MV Decel Time: 215 msec      TR Vmax:        332.00 cm/s MR Peak grad:    170.0 mmHg MR Mean grad:    106.0 mmHg  SHUNTS MR Vmax:         652.00 cm/s Systemic VTI:  0.22 m MR Vmean:        483.0 cm/s  Systemic Diam: 1.90 cm MR PISA:         1.57 cm MR PISA Eff ROA: 10 mm MR PISA Radius:  0.50 cm MV E velocity: 135.00 cm/s MV A velocity: 88.80 cm/s MV E/A ratio:  1.52 Lyman Bishop MD Electronically signed by Lyman Bishop MD Signature Date/Time: 06/08/2020/1:14:13 PM    Final     ROS: As per HPI otherwise negative.   Physical Exam: Vitals:   06/08/20 0815 06/08/20 0836 06/08/20 1100 06/08/20 1200  BP: (!) 154/66 (!) 157/88 (!) 165/66 (!) 153/69  Pulse: 77 76 72 72  Resp: 15 11 16 16   Temp:  97.6 F (36.4 C)    TempSrc:  Oral    SpO2: 95% 94% 96% 94%  Weight:      Height:         General: Pleasant elderly female in no acute distress. Head: Normocephalic, atraumatic, sclera non-icteric, mucus membranes are moist Neck: Supple. JVD not elevated. Lungs: Clear bilaterally to auscultation without wheezes, rales, or rhonchi. Breathing is unlabored. Heart: RRR with S1 S2 3/6 systolic M. SR on monitor.  Abdomen: Soft, non-tender, non-distended with normoactive bowel sounds. No rebound/guarding. No  obvious abdominal masses. M-S:  Strength and tone appear normal for age. Lower extremities: 1+ BLE pitting edema. LLE externally rotated, shortened.  Neuro: Alert and oriented X 3. Moves all extremities spontaneously. Psych:  Responds to questions appropriately with a normal affect. Dialysis Access: L AVF aneurysmal, + T/B  Dialysis  Orders: Center: [facility], T,Th, S  4 hrs 180NRe 400/800 77.5 kg 2.0 K/ 2.0 Ca UFP 4 AVF -Heparin 1000 units IV TIW -Mircera 150 mcg IV q 2 weeks (last dose 05/28/2020 last HGB 10.6 06/04/2020) -Venofer 100 mg IV X 10 doses (1/10 doses given) Tsat 19 05/28/2020.   Assessment/Plan: 1.  L Hip fracture S/P mechanical fall-Seen by orthopedics. L hemiarthroplasty planned pending cardiology clearance. Per primary/Orthopedics.  2. Acute on chronic diastolic HF-Last EF 50 to 55% 06/08/2020. Does have evidence of mild volume overload by exam/CXR. Plan for HD today off schedule. UF as tolerated. 3.  ESRD - T,Th,S. Will maintain MWF schedule this week for facilitate plan for L hip repair. K+ 4.3. Tight heparin.  4.  Hypertension/volume  - BP is volume controlled. Elevated at present but usually comes down with HD. No home BP meds. Volume as noted above.  5.  Anemia  - HGB 11.0. ESA due 06/11/2020. Continue OP Fe load.  6.  Metabolic bone disease - C Ca OK. Continue binders.  7.  Nutrition - Albumin OK. NPO at present Renal diet, renal vitamins, nepro when eating.  8. PAF-on coumadin. Management per primary/pharmacy.  9. Prolonged QTc in setting of RBBB-no benadryl or agents that could increase QTc.    Rita H. Owens Shark, NP-C 06/08/2020, 2:08 PM  [facility] Beeper [phone]    L Hip fracture S/P mechanical fall-Seen by orthopedics. left hip hemiarthroplasty pending cardiology clearance. Per primary/Orth

## 2020-06-08 NOTE — Progress Notes (Signed)
  Echocardiogram 2D Echocardiogram has been performed.  [name] 06/08/2020, 10:38 AM

## 2020-06-08 NOTE — Progress Notes (Signed)
Patient arrived to unit alert and oriented on stretcher via transport.,c/o pain 5/10 pain to left hip.

## 2020-06-09 ENCOUNTER — Encounter (HOSPITAL_COMMUNITY)
Admission: EM | Disposition: A | Discharge status: Skilled Nursing Facility | Payer: Self-pay | Source: Home / Self Care | Attending: Family Medicine

## 2020-06-09 ENCOUNTER — Inpatient Hospital Stay (HOSPITAL_COMMUNITY): Payer: Medicare Other | Admitting: Certified Registered Nurse Anesthetist

## 2020-06-09 ENCOUNTER — Inpatient Hospital Stay (HOSPITAL_COMMUNITY): Payer: Medicare Other

## 2020-06-09 ENCOUNTER — Encounter (HOSPITAL_COMMUNITY): Payer: Self-pay | Admitting: Internal Medicine

## 2020-06-09 DIAGNOSIS — Z7901 Long term (current) use of anticoagulants: Secondary | ICD-10-CM

## 2020-06-09 DIAGNOSIS — S72002A Fracture of unspecified part of neck of left femur, initial encounter for closed fracture: Secondary | ICD-10-CM | POA: Diagnosis not present

## 2020-06-09 DIAGNOSIS — I480 Paroxysmal atrial fibrillation: Secondary | ICD-10-CM | POA: Diagnosis not present

## 2020-06-09 DIAGNOSIS — J9601 Acute respiratory failure with hypoxia: Secondary | ICD-10-CM | POA: Diagnosis not present

## 2020-06-09 HISTORY — PX: HIP ARTHROPLASTY: SHX981

## 2020-06-09 LAB — BASIC METABOLIC PANEL
Anion gap: 13 (ref 5–15)
BUN: 19 mg/dL (ref 8–23)
CO2: 28 mmol/L (ref 22–32)
Calcium: 8.8 mg/dL — ABNORMAL LOW (ref 8.9–10.3)
Chloride: 98 mmol/L (ref 98–111)
Creatinine, Ser: 4.15 mg/dL — ABNORMAL HIGH (ref 0.44–1.00)
GFR, Estimated: 11 mL/min — ABNORMAL LOW (ref >60)
Glucose, Bld: 101 mg/dL — ABNORMAL HIGH (ref 70–99)
Potassium: 4.1 mmol/L (ref 3.5–5.1)
Sodium: 139 mmol/L (ref 135–145)

## 2020-06-09 LAB — CBC
HCT: 35.8 % — ABNORMAL LOW (ref 36.0–46.0)
Hemoglobin: 11.2 g/dL — ABNORMAL LOW (ref 12.0–15.0)
MCH: 31.1 pg (ref 26.0–34.0)
MCHC: 31.3 g/dL (ref 30.0–36.0)
MCV: 99.4 fL (ref 80.0–100.0)
Platelets: 201 10*3/uL (ref 150–400)
RBC: 3.6 MIL/uL — ABNORMAL LOW (ref 3.87–5.11)
RDW: 17.7 % — ABNORMAL HIGH (ref 11.5–15.5)
WBC: 11.4 10*3/uL — ABNORMAL HIGH (ref 4.0–10.5)
nRBC: 0 % (ref 0.0–0.2)

## 2020-06-09 LAB — PROTIME-INR
INR: 1.3 — ABNORMAL HIGH (ref 0.8–1.2)
Prothrombin Time: 15.9 seconds — ABNORMAL HIGH (ref 11.4–15.2)

## 2020-06-09 SURGERY — HEMIARTHROPLASTY, HIP, DIRECT ANTERIOR APPROACH, FOR FRACTURE
Anesthesia: General | Site: Hip | Laterality: Left

## 2020-06-09 MED ORDER — PROPOFOL 10 MG/ML IV BOLUS
INTRAVENOUS | Status: AC
  Filled 2020-06-09: qty 20

## 2020-06-09 MED ORDER — WARFARIN - PHARMACIST DOSING INPATIENT: Freq: Every day | Status: DC

## 2020-06-09 MED ORDER — BUPIVACAINE HCL (PF) 0.25 % IJ SOLN
INTRAMUSCULAR | Status: DC | PRN
  Administered 2020-06-09: 20 mL

## 2020-06-09 MED ORDER — ENOXAPARIN SODIUM 30 MG/0.3ML ~~LOC~~ SOLN: 30.0000 mg | SUBCUTANEOUS | Status: DC

## 2020-06-09 MED ORDER — FENTANYL CITRATE (PF) 250 MCG/5ML IJ SOLN
INTRAMUSCULAR | Status: AC
  Filled 2020-06-09: qty 5

## 2020-06-09 MED ORDER — CEFAZOLIN SODIUM-DEXTROSE 2-4 GM/100ML-% IV SOLN
2.0000 g | INTRAVENOUS | Status: AC
  Administered 2020-06-09: 2 g via INTRAVENOUS

## 2020-06-09 MED ORDER — HYDROMORPHONE HCL 1 MG/ML IJ SOLN
INTRAMUSCULAR | Status: DC | PRN
  Administered 2020-06-09: .5 mg via INTRAVENOUS

## 2020-06-09 MED ORDER — CEPHALEXIN 250 MG PO CAPS
250.0000 mg | ORAL_CAPSULE | Freq: Every day | ORAL | Status: DC
  Administered 2020-06-11 – 2020-06-24 (×14): 250 mg via ORAL
  Filled 2020-06-09 (×15): qty 1

## 2020-06-09 MED ORDER — PROPOFOL 10 MG/ML IV BOLUS
INTRAVENOUS | Status: DC | PRN
  Administered 2020-06-09: 100 mg via INTRAVENOUS

## 2020-06-09 MED ORDER — POLYETHYLENE GLYCOL 3350 17 G PO PACK: 17.0000 g | PACK | Freq: Every day | ORAL | Status: DC | PRN

## 2020-06-09 MED ORDER — OXYCODONE HCL 5 MG PO TABS
10.0000 mg | ORAL_TABLET | ORAL | Status: DC | PRN
  Administered 2020-06-21: 10 mg via ORAL
  Filled 2020-06-09 (×2): qty 2

## 2020-06-09 MED ORDER — MENTHOL 3 MG MT LOZG: 1.0000 | LOZENGE | OROMUCOSAL | Status: DC | PRN

## 2020-06-09 MED ORDER — DEXAMETHASONE SODIUM PHOSPHATE 10 MG/ML IJ SOLN
INTRAMUSCULAR | Status: AC
  Filled 2020-06-09: qty 1

## 2020-06-09 MED ORDER — ORAL CARE MOUTH RINSE
15.0000 mL | Freq: Once | OROMUCOSAL | Status: AC
  Administered 2020-06-09: 15 mL via OROMUCOSAL

## 2020-06-09 MED ORDER — SODIUM CHLORIDE 0.9 % IV SOLN: INTRAVENOUS | Status: DC

## 2020-06-09 MED ORDER — PHENOL 1.4 % MT LIQD: 1.0000 | OROMUCOSAL | Status: DC | PRN

## 2020-06-09 MED ORDER — LACTATED RINGERS IV SOLN: INTRAVENOUS | Status: DC | PRN

## 2020-06-09 MED ORDER — ENSURE PRE-SURGERY PO LIQD: 296.0000 mL | Freq: Once | ORAL | Status: DC

## 2020-06-09 MED ORDER — LIDOCAINE 2% (20 MG/ML) 5 ML SYRINGE
INTRAMUSCULAR | Status: AC
  Filled 2020-06-09: qty 5

## 2020-06-09 MED ORDER — FENTANYL CITRATE (PF) 250 MCG/5ML IJ SOLN
INTRAMUSCULAR | Status: DC | PRN
  Administered 2020-06-09 (×3): 50 ug via INTRAVENOUS

## 2020-06-09 MED ORDER — ACETAMINOPHEN 500 MG PO TABS
1000.0000 mg | ORAL_TABLET | Freq: Four times a day (QID) | ORAL | Status: AC
  Administered 2020-06-09 – 2020-06-10 (×3): 1000 mg via ORAL
  Filled 2020-06-09 (×3): qty 2

## 2020-06-09 MED ORDER — ONDANSETRON HCL 4 MG PO TABS
4.0000 mg | ORAL_TABLET | Freq: Four times a day (QID) | ORAL | Status: DC | PRN
  Administered 2020-06-21: 4 mg via ORAL
  Filled 2020-06-09: qty 1

## 2020-06-09 MED ORDER — BISACODYL 10 MG RE SUPP
10.0000 mg | Freq: Every day | RECTAL | Status: DC | PRN
Start: 2020-06-09 — End: 2020-06-14

## 2020-06-09 MED ORDER — POVIDONE-IODINE 10 % EX SWAB: 2.0000 "application " | Freq: Once | CUTANEOUS | Status: DC

## 2020-06-09 MED ORDER — NEOSTIGMINE METHYLSULFATE 3 MG/3ML IV SOSY
PREFILLED_SYRINGE | INTRAVENOUS | Status: AC
  Filled 2020-06-09: qty 6

## 2020-06-09 MED ORDER — CHLORHEXIDINE GLUCONATE 0.12 % MT SOLN
OROMUCOSAL | Status: AC
  Filled 2020-06-09: qty 15

## 2020-06-09 MED ORDER — DEXAMETHASONE SODIUM PHOSPHATE 10 MG/ML IJ SOLN
INTRAMUSCULAR | Status: DC | PRN
  Administered 2020-06-09: 4 mg via INTRAVENOUS

## 2020-06-09 MED ORDER — ROCURONIUM BROMIDE 10 MG/ML (PF) SYRINGE
PREFILLED_SYRINGE | INTRAVENOUS | Status: AC
  Filled 2020-06-09: qty 10

## 2020-06-09 MED ORDER — CEFAZOLIN SODIUM-DEXTROSE 2-4 GM/100ML-% IV SOLN
2.0000 g | Freq: Four times a day (QID) | INTRAVENOUS | Status: AC
  Administered 2020-06-09 – 2020-06-10 (×2): 2 g via INTRAVENOUS
  Filled 2020-06-09 (×2): qty 100

## 2020-06-09 MED ORDER — BUPIVACAINE HCL (PF) 0.25 % IJ SOLN
INTRAMUSCULAR | Status: AC
  Filled 2020-06-09: qty 30

## 2020-06-09 MED ORDER — GLYCOPYRROLATE 0.2 MG/ML IJ SOLN
INTRAMUSCULAR | Status: DC | PRN
  Administered 2020-06-09: .6 mg via INTRAVENOUS

## 2020-06-09 MED ORDER — METHOCARBAMOL 1000 MG/10ML IJ SOLN
500.0000 mg | Freq: Four times a day (QID) | INTRAVENOUS | Status: DC | PRN
  Filled 2020-06-09: qty 5

## 2020-06-09 MED ORDER — HYDROMORPHONE HCL 1 MG/ML IJ SOLN
INTRAMUSCULAR | Status: AC
  Filled 2020-06-09: qty 0.5

## 2020-06-09 MED ORDER — HYDROMORPHONE HCL 1 MG/ML IJ SOLN
0.5000 mg | INTRAMUSCULAR | Status: DC | PRN
  Administered 2020-06-10 – 2020-06-20 (×5): 1 mg via INTRAVENOUS
  Filled 2020-06-09 (×6): qty 1

## 2020-06-09 MED ORDER — WARFARIN SODIUM 6 MG PO TABS
7.0000 mg | ORAL_TABLET | Freq: Once | ORAL | Status: AC
  Administered 2020-06-09: 7 mg via ORAL
  Filled 2020-06-09 (×2): qty 1

## 2020-06-09 MED ORDER — ALBUMIN HUMAN 5 % IV SOLN: INTRAVENOUS | Status: DC | PRN

## 2020-06-09 MED ORDER — ONDANSETRON HCL 4 MG/2ML IJ SOLN
4.0000 mg | Freq: Four times a day (QID) | INTRAMUSCULAR | Status: DC | PRN
  Administered 2020-06-20: 4 mg via INTRAVENOUS
  Filled 2020-06-09 (×3): qty 2

## 2020-06-09 MED ORDER — ESMOLOL HCL 100 MG/10ML IV SOLN
INTRAVENOUS | Status: AC
  Filled 2020-06-09: qty 10

## 2020-06-09 MED ORDER — LIDOCAINE 2% (20 MG/ML) 5 ML SYRINGE
INTRAMUSCULAR | Status: DC | PRN
  Administered 2020-06-09: 40 mg via INTRAVENOUS

## 2020-06-09 MED ORDER — ROCURONIUM BROMIDE 10 MG/ML (PF) SYRINGE
PREFILLED_SYRINGE | INTRAVENOUS | Status: DC | PRN
  Administered 2020-06-09: 70 mg via INTRAVENOUS

## 2020-06-09 MED ORDER — METHOCARBAMOL 500 MG PO TABS
500.0000 mg | ORAL_TABLET | Freq: Four times a day (QID) | ORAL | Status: DC | PRN
  Administered 2020-06-12 – 2020-06-20 (×13): 500 mg via ORAL
  Filled 2020-06-09 (×13): qty 1

## 2020-06-09 MED ORDER — NEOSTIGMINE METHYLSULFATE 10 MG/10ML IV SOLN
INTRAVENOUS | Status: DC | PRN
  Administered 2020-06-09: 4 mg via INTRAVENOUS

## 2020-06-09 MED ORDER — ONDANSETRON HCL 4 MG/2ML IJ SOLN
INTRAMUSCULAR | Status: AC
  Filled 2020-06-09: qty 2

## 2020-06-09 MED ORDER — OXYCODONE HCL 5 MG PO TABS
5.0000 mg | ORAL_TABLET | ORAL | Status: DC | PRN
  Administered 2020-06-20 (×2): 5 mg via ORAL
  Filled 2020-06-09 (×2): qty 1

## 2020-06-09 MED ORDER — CEFAZOLIN SODIUM-DEXTROSE 2-4 GM/100ML-% IV SOLN
INTRAVENOUS | Status: AC
  Filled 2020-06-09: qty 100

## 2020-06-09 MED ORDER — GLYCOPYRROLATE PF 0.2 MG/ML IJ SOSY
PREFILLED_SYRINGE | INTRAMUSCULAR | Status: AC
  Filled 2020-06-09: qty 3

## 2020-06-09 MED ORDER — PHENYLEPHRINE HCL-NACL 10-0.9 MG/250ML-% IV SOLN
INTRAVENOUS | Status: DC | PRN
  Administered 2020-06-09: 40 ug/min via INTRAVENOUS

## 2020-06-09 MED ORDER — SODIUM CHLORIDE 0.9 % IR SOLN
Status: DC | PRN
  Administered 2020-06-09: 1000 mL

## 2020-06-09 SURGICAL SUPPLY — 48 items
BLADE SAW SGTL 18.5X63.X.64 HD (BLADE) ×2 IMPLANT
CLSR STERI-STRIP ANTIMIC 1/2X4 (GAUZE/BANDAGES/DRESSINGS) ×3 IMPLANT
COVER BACK TABLE 24X17X13 BIG (DRAPES) IMPLANT
COVER SURGICAL LIGHT HANDLE (MISCELLANEOUS) ×2 IMPLANT
DRAPE IMP U-DRAPE 54X76 (DRAPES) ×2 IMPLANT
DRAPE INCISE IOBAN 66X45 STRL (DRAPES) ×2 IMPLANT
DRAPE ORTHO SPLIT 77X108 STRL (DRAPES) ×4
DRAPE SURG ORHT 6 SPLT 77X108 (DRAPES) ×2 IMPLANT
DRAPE U-SHAPE 47X51 STRL (DRAPES) ×2 IMPLANT
DRSG MEPILEX BORDER 4X12 (GAUZE/BANDAGES/DRESSINGS) ×1 IMPLANT
DRSG MEPILEX SACRM 8.7X9.8 (GAUZE/BANDAGES/DRESSINGS) ×1 IMPLANT
DURAPREP 26ML APPLICATOR (WOUND CARE) ×3 IMPLANT
ELECT CAUTERY BLADE 6.4 (BLADE) ×2 IMPLANT
ELECT REM PT RETURN 9FT ADLT (ELECTROSURGICAL) ×2
ELECTRODE REM PT RTRN 9FT ADLT (ELECTROSURGICAL) ×1 IMPLANT
FACESHIELD WRAPAROUND (MASK) ×4 IMPLANT
FACESHIELD WRAPAROUND OR TEAM (MASK) ×2 IMPLANT
GLOVE BIO SURGEON STRL SZ7 (GLOVE) ×2 IMPLANT
GLOVE BIOGEL PI IND STRL 7.0 (GLOVE) ×1 IMPLANT
GLOVE BIOGEL PI INDICATOR 7.0 (GLOVE) ×1
GLOVE ORTHO TXT STRL SZ7.5 (GLOVE) ×2 IMPLANT
GOWN STRL REUS W/ TWL XL LVL3 (GOWN DISPOSABLE) ×1 IMPLANT
GOWN STRL REUS W/TWL 2XL LVL3 (GOWN DISPOSABLE) ×2 IMPLANT
GOWN STRL REUS W/TWL XL LVL3 (GOWN DISPOSABLE) ×2
HEAD FEM UNIPOLAR 48 OD (Hips) ×1 IMPLANT
KIT BASIN OR (CUSTOM PROCEDURE TRAY) ×2 IMPLANT
KIT TURNOVER KIT B (KITS) ×2 IMPLANT
MANIFOLD NEPTUNE II (INSTRUMENTS) ×2 IMPLANT
NEEDLE 22X1 1/2 (OR ONLY) (NEEDLE) ×2 IMPLANT
NS IRRIG 1000ML POUR BTL (IV SOLUTION) ×2 IMPLANT
PACK TOTAL JOINT (CUSTOM PROCEDURE TRAY) ×2 IMPLANT
PAD ARMBOARD 7.5X6 YLW CONV (MISCELLANEOUS) ×3 IMPLANT
PILLOW ABDUCTION MEDIUM (MISCELLANEOUS) ×2 IMPLANT
RETRIEVER SUT HEWSON (MISCELLANEOUS) ×2 IMPLANT
SPACER DEPUY (Hips) ×1 IMPLANT
STEM SUMMIT BASIC PRESSFIT SZ5 (Hips) ×1 IMPLANT
SUT FIBERWIRE #2 38 REV NDL BL (SUTURE) ×4
SUT VIC AB 0 CT1 27 (SUTURE) ×2
SUT VIC AB 0 CT1 27XBRD ANBCTR (SUTURE) ×1 IMPLANT
SUT VIC AB 1 CT1 27 (SUTURE) ×4
SUT VIC AB 1 CT1 27XBRD ANBCTR (SUTURE) ×2 IMPLANT
SUT VIC AB 3-0 SH 8-18 (SUTURE) ×2 IMPLANT
SUTURE FIBERWR#2 38 REV NDL BL (SUTURE) ×2 IMPLANT
SYR CONTROL 10ML LL (SYRINGE) ×2 IMPLANT
TOWEL GREEN STERILE (TOWEL DISPOSABLE) ×2 IMPLANT
TOWEL GREEN STERILE FF (TOWEL DISPOSABLE) ×3 IMPLANT
TRAY FOLEY MTR SLVR 16FR STAT (SET/KITS/TRAYS/PACK) ×2 IMPLANT
WATER STERILE IRR 1000ML POUR (IV SOLUTION) ×6 IMPLANT

## 2020-06-09 NOTE — Progress Notes (Signed)
The patient has been re-examined, and the chart reviewed, and there have been no interval changes to the documented history and physical.    The risks benefits and alternatives were discussed with the patient including but not limited to the risks of nonoperative treatment, versus surgical intervention including infection, bleeding, nerve injury, periprosthetic fracture, the need for revision surgery, dislocation, leg length discrepancy, blood clots, cardiopulmonary complications, morbidity, mortality, among others, and they were willing to proceed.    Left hip hemi.   Johnny Bridge, MD

## 2020-06-09 NOTE — Anesthesia Procedure Notes (Signed)
Procedure Name: Intubation Performed by: Viridiana Spaid H, CRNA Pre-anesthesia Checklist: Patient identified, Emergency Drugs available, Suction available and Patient being monitored Patient Re-evaluated:Patient Re-evaluated prior to induction Oxygen Delivery Method: Circle System Utilized Preoxygenation: Pre-oxygenation with 100% oxygen Induction Type: IV induction Ventilation: Mask ventilation without difficulty Laryngoscope Size: Miller and 2 Grade View: Grade I Tube type: Oral Tube size: 7.0 mm Number of attempts: 1 Airway Equipment and Method: Stylet and Oral airway Placement Confirmation: ETT inserted through vocal cords under direct vision,  positive ETCO2 and breath sounds checked- equal and bilateral Secured at: 23 cm Tube secured with: Tape Dental Injury: Teeth and Oropharynx as per pre-operative assessment        

## 2020-06-09 NOTE — Progress Notes (Addendum)
ANTICOAGULATION CONSULT NOTE - Initial Consult  Pharmacy Consult for Warfarin Indication:  Atrial fibrillation  Allergies  Allergen Reactions  . Percocet [Oxycodone-Acetaminophen] Nausea And Vomiting  . Vicodin [Hydrocodone-Acetaminophen] Nausea And Vomiting  . Chlorhexidine Rash    Pt states she gets rash similar to "sunburn"    Patient Measurements: Height: 5\' 2"  (157.5 cm) Weight: 76.8 kg (169 lb 5 oz) IBW/kg (Calculated) : 50.1  Vital Signs: Temp: 97.6 F (36.4 C) (12/21 2015) BP: 157/64 (12/21 2015) Pulse Rate: 75 (12/21 2015)  Labs: Recent Labs    06/07/20 2020 06/07/20 2046 06/08/20 0525 06/08/20 1456 06/09/20 0410 06/09/20 0742  HGB 11.7* 12.9 11.0*  --  11.2*  --   HCT 38.8 38.0 36.9  --  35.8*  --   PLT 230  --  220  --  201  --   LABPROT 25.0*  --  18.7*  --   --  15.9*  INR 2.4*  --  1.6*  --   --  1.3*  CREATININE 5.41* 5.30*  --  6.59* 4.15*  --     Estimated Creatinine Clearance: 11.2 mL/min (A) (by C-G formula based on SCr of 4.15 mg/dL (H)).   Medical History: History reviewed. No pertinent past medical history.  Assessment: 74 yr old female admitted on 06/07/20 with L femoral neck fracture; pt is S/P hemiarthroplasty this evening.  Pt was taking warfarin at home PTA for atrial fibrillation (5 mg on Tues/Thurs, 2.5 mg on other days); INR on admission was 2.4, INR this AM was down to 1.3. Warfarin was held since admission for surgery, and pt was given vitamin K 5 mg IV X 1 on 12/19. Pharmacy is consulted to resume warfarin dosing for atrial fibrillation.  H/H 11.2/35.8, platelets 201; albumin 2.9. Pt was on amiodarone PTA, which is continued during this admission. Post-op diet not yet ordered.  Goal of Therapy:  INR 2-3 Monitor platelets by anticoagulation protocol: Yes   Plan:  Warfarin 7 mg PO X 1 tonight Monitor daily INR, CBC Monitor for signs/symptoms of bleeding  Gillermina Hu, PharmD, BCPS, [facility] Clinical  Pharmacist 06/09/2020,8:30 PM

## 2020-06-09 NOTE — Op Note (Signed)
06/07/2020 - 06/09/2020  7:22 PM  PATIENT:  [name]   MRN: [id number]  PRE-OPERATIVE DIAGNOSIS:  Left femoral neck fracture  POST-OPERATIVE DIAGNOSIS:  Left femoral neck fracture  PROCEDURE:  Procedure(s): ARTHROPLASTY BIPOLAR HIP (HEMIARTHROPLASTY)  PREOPERATIVE INDICATIONS:  Nela Bascom is an 74 y.o. female who was admitted 06/07/2020 with a diagnosis of Hip fracture ([place]) and elected for surgical management.  The risks benefits and alternatives were discussed with the patient including but not limited to the risks of nonoperative treatment, versus surgical intervention including infection, bleeding, nerve injury, periprosthetic fracture, the need for revision surgery, dislocation, leg length discrepancy, blood clots, cardiopulmonary complications, morbidity, mortality, among others, and they were willing to proceed.  Predicted outcome is good, although there will be at least a six to nine month expected recovery.   OPERATIVE REPORT     SURGEON:  Marchia Bond, MD    ASSISTANT:  Merlene Pulling, PA-C,   (Present throughout the entire procedure,  necessary for completion of procedure in a timely manner, assisting with retraction, instrumentation, and closure)     ANESTHESIA:  general  ESTIMATED BLOOD LOSS: 638 ml    COMPLICATIONS:  None.   We did send a urine specimen, because during placement of the Foley there was purulent appearing fluid, although the patient is anuric, so it is not clearly the clinical relevance.  UNIQUE ASPECTS OF THE CASE: The femoral neck fracture was fairly high.  I had a fairly long neck.  I was between a size 4 and a size 5, and therefore felt a little bit short although it was stable, but after the trialing it was loose so I went up to the 5, I could not see it fully but I had excellent press-fit, and so excepted the slightly proud 5, with restored leg length nicely.  Bone quality was poor.     COMPONENTS:  Chemical engineer Femoral  Fracture stem size 5, with a -3 spacer and a fracture head unipolar hip ball.    PROCEDURE IN DETAIL: The patient was met in the holding area and identified.  The appropriate hip  was marked at the operative site. The patient was then transported to the OR and  placed under anesthesia.  At that point, the patient was  placed in the lateral decubitus position with the operative side up and  secured to the operating room table and all bony prominences padded.     The operative lower extremity was prepped from the iliac crest to the toes.  Sterile draping was performed.  Time out was performed prior to incision.      A routine posterolateral approach was utilized via sharp dissection  carried down to the subcutaneous tissue.  Gross bleeders were Bovie  coagulated.  The iliotibial band was identified and incised  along the length of the skin incision.  Self-retaining retractors were  inserted.  With the hip internally rotated, the short external rotators  were identified. The piriformis was tagged with FiberWire, and the hip capsule released in a T-type fashion.  The femoral neck was exposed, and I resected the femoral neck using the appropriate jig. This was performed at approximately a thumb's breadth above the lesser trochanter.    I then exposed the deep acetabulum, cleared out any tissue including the ligamentum teres, and included the hip capsule in the FiberWire used above and below the T.    I then prepared the proximal femur using the cookie-cutter, the lateralizing reamer, and  then sequentially broached.  A trial utilized, and I reduced the hip and it was found to have excellent stability with functional range of motion. The trial components were then removed.   I then place the real implant and I impacted the real head ball into place. The hip was then reduced and taken through functional range of motion and found to have excellent stability. Leg lengths were restored.  I then used a 2 mm  drill bits to pass the FiberWire suture from the capsule and piriformis through the greater trochanter, and secured this. Excellent posterior capsular repair was achieved. I also closed the T in the capsule.  I then irrigated the hip copiously again with pulse lavage, and repaired the fascia with Vicryl, followed by Vicryl for the subcutaneous tissue, Monocryl for the skin, Steri-Strips and sterile gauze. The wounds were injected. The patient was then awakened and returned to PACU in stable and satisfactory condition. There were no complications.  Marchia Bond, MD Orthopedic Surgeon [phone]   06/09/2020 7:22 PM

## 2020-06-09 NOTE — Anesthesia Preprocedure Evaluation (Addendum)
Anesthesia Evaluation  Patient identified by MRN, date of birth, ID band Patient awake    Reviewed: Allergy & Precautions, NPO status , Patient's Chart, lab work & pertinent test results  History of Anesthesia Complications Negative for: history of anesthetic complications  Airway Mallampati: II  TM Distance: >3 FB Neck ROM: Full    Dental  (+) Dental Advisory Given, Chipped   Pulmonary neg pulmonary ROS,    Pulmonary exam normal        Cardiovascular + dysrhythmias Atrial Fibrillation + Valvular Problems/Murmurs AS and MR  Rhythm:Regular Rate:Normal + Systolic murmurs  '21 TTE - EF 50 to 55%. Mild LVH. Grade II diastolic dysfunction (pseudonormalization). Right ventricular systolic function is mildly reduced. Moderately elevated pulmonary artery systolic pressure. The estimated right ventricular systolic pressure is 77.9 mmHg. LA was severely dilated. RA was moderately dilated. Mild to moderate mitral valve regurgitation. Tricuspid valve regurgitation is moderate to severe. Aortic valve regurgitation is trivial. Severe aortic valve stenosis. Aortic valve area, by VTI measures 0.60 cm. Aortic valve mean gradient measures 38.0 mmHg. Aortic valve Vmax measures 4.09 m/s. Peak gradient 67 mmHg. DI is 0.21.   Hx of reduced EF (20-25%) in setting of sepsis, since resolved as above    Neuro/Psych negative neurological ROS  negative psych ROS   GI/Hepatic negative GI ROS, Neg liver ROS,   Endo/Other   Obesity   Renal/GU ESRF and DialysisRenal disease     Musculoskeletal negative musculoskeletal ROS (+)   Abdominal   Peds  Hematology  (+) anemia ,  INR 1.3 On coumadin    Anesthesia Other Findings Covid test negative   Reproductive/Obstetrics                           Anesthesia Physical Anesthesia Plan  ASA: IV  Anesthesia Plan: General   Post-op Pain Management:    Induction:  Intravenous  PONV Risk Score and Plan: 3 and Treatment may vary due to age or medical condition and Ondansetron  Airway Management Planned: Oral ETT  Additional Equipment: Arterial line  Intra-op Plan:   Post-operative Plan: Extubation in OR  Informed Consent: I have reviewed the patients History and Physical, chart, labs and discussed the procedure including the risks, benefits and alternatives for the proposed anesthesia with the patient or authorized representative who has indicated his/her understanding and acceptance.     Dental advisory given  Plan Discussed with: CRNA and Anesthesiologist  Anesthesia Plan Comments:        Anesthesia Quick Evaluation

## 2020-06-09 NOTE — Progress Notes (Signed)
[facility] Progress Note   Subjective: Seen in room with daughter at bedside. Going for surgery this AM. No C/Os. She will continue on MWF HD schedule for now, she is aware of this and agrees.   Objective Vitals:   06/08/20 1953 06/09/20 0300 06/09/20 0425 06/09/20 0752  BP: (!) 123/53 119/61  140/63  Pulse: 76 71  78  Resp: 16 16  17   Temp: 98.4 F (36.9 C) 98.1 F (36.7 C)  98.3 F (36.8 C)  TempSrc: Oral Oral  Oral  SpO2: 91% 90%  90%  Weight:   76.8 kg   Height:       Physical Exam General: Pleasant elderly female in NAD Heart:S1 S2 3/6 systolic M.  Lungs: CTAB Abdomen:Active BS, ND Extremities: Trace BLE edema.  Dialysis Access:L AVF aneurysmal, + T/B    Additional Objective Labs: Basic Metabolic Panel: Recent Labs  Lab 06/07/20 2020 06/07/20 2046 06/08/20 1456 06/09/20 0410  NA 139 137 137 139  K 4.5 4.3 5.5* 4.1  CL 95* 97* 94* 98  CO2 29  --  28 28  GLUCOSE 123* 116* 111* 101*  BUN 31* 33* 41* 19  CREATININE 5.41* 5.30* 6.59* 4.15*  CALCIUM 9.1  --  9.0 8.8*  PHOS  --   --  5.0*  --    Liver Function Tests: Recent Labs  Lab 06/07/20 2020 06/08/20 1456  AST 21  --   ALT 17  --   ALKPHOS 97  --   BILITOT 1.3*  --   PROT 6.3*  --   ALBUMIN 3.3* 2.9*   No results for input(s): LIPASE, AMYLASE in the last 168 hours. CBC: Recent Labs  Lab 06/07/20 2020 06/07/20 2046 06/08/20 0525 06/09/20 0410  WBC 14.7*  --  14.0* 11.4*  NEUTROABS 13.5*  --   --   --   HGB 11.7* 12.9 11.0* 11.2*  HCT 38.8 38.0 36.9 35.8*  MCV 98.5  --  99.5 99.4  PLT 230  --  220 201   Blood Culture No results found for: SDES, SPECREQUEST, CULT, REPTSTATUS  Cardiac Enzymes: No results for input(s): CKTOTAL, CKMB, CKMBINDEX, TROPONINI in the last 168 hours. CBG: Recent Labs  Lab 06/08/20 2019  GLUCAP 101*   Iron Studies: No results for input(s): IRON, TIBC, TRANSFERRIN, FERRITIN in the last 72 hours. @lablastinr3 @ Studies/Results: CT Head Wo  Contrast  Result Date: 06/07/2020 CLINICAL DATA:  Status post trauma. EXAM: CT HEAD WITHOUT CONTRAST TECHNIQUE: Contiguous axial images were obtained from the base of the skull through the vertex without intravenous contrast. COMPARISON:  April 11, 2012 FINDINGS: Brain: There is mild cerebral atrophy with widening of the extra-axial spaces and ventricular dilatation. There are areas of decreased attenuation within the white matter tracts of the supratentorial brain, consistent with microvascular disease changes. Vascular: No hyperdense vessel or unexpected calcification. Skull: Normal. Negative for fracture or focal lesion. Sinuses/Orbits: No acute finding. Other: None. IMPRESSION: No acute intracranial pathology. Electronically Signed   By: Virgina Norfolk M.D.   On: 06/07/2020 21:10   DG Pelvis Portable  Result Date: 06/07/2020 CLINICAL DATA:  Initial evaluation for acute trauma, fall. EXAM: PORTABLE PELVIS 1-2 VIEWS COMPARISON:  None. FINDINGS: Acute fracture extends through the left femoral neck with superior subluxation. Left femoral head remains normally position within the acetabulum. Femoral head intact. No associated acetabular fracture. Bony pelvis intact. Limited views of the right hip demonstrate no acute osseous abnormality. No visible soft tissue injury. Underlying osteopenia.  Mild degenerative changes noted within the lower lumbar spine. IMPRESSION: Acute fracture of the left femoral neck with superior subluxation. Electronically Signed   By: Jeannine Boga M.D.   On: 06/07/2020 20:44   DG Chest Portable 1 View  Result Date: 06/07/2020 CLINICAL DATA:  Initial evaluation for acute trauma, fall. EXAM: PORTABLE CHEST 1 VIEW COMPARISON:  Prior radiograph from 04/23/2018. FINDINGS: Cardiomegaly. Mediastinal silhouette within normal limits. Moderate sized hiatal hernia noted. Lungs normally inflated. Perihilar vascular congestion without overt pulmonary edema. No visible pleural  effusion. No focal infiltrates. No pneumothorax. There is question of focal osseous irregularity near the left coracoid process, favored to be secondary to patient positioning. No other acute osseous abnormality. Underlying osteopenia. IMPRESSION: 1. Question focal osseous irregularity near the left coracoid process, favored to be secondary to patient positioning. Correlation with physical exam recommended. Further assessment with dedicated radiograph of the left shoulder recommended as clinically warranted. 2. Cardiomegaly with perihilar vascular congestion without overt pulmonary edema. 3. Moderate sized hiatal hernia. Electronically Signed   By: Jeannine Boga M.D.   On: 06/07/2020 20:47   ECHOCARDIOGRAM COMPLETE  Result Date: 06/08/2020    ECHOCARDIOGRAM REPORT   Patient Name:   [name] Date of Exam: 06/08/2020 Medical Rec #:  [id number]     Height:       62.0 in Accession #:    [id number]    Weight:       173.0 lb Date of Birth:  06-29-1945      BSA:          1.797 m Patient Age:    74 years      BP:           143/66 mmHg Patient Gender: F             HR:           73 bpm. Exam Location:  Inpatient Procedure: 2D Echo, Cardiac Doppler and Color Doppler Indications:    I50.33 Acute on chronic diastolic (congestive) heart failure  History:        Patient has no prior history of Echocardiogram examinations.  Sonographer:    Jonelle Sidle Dance Referring Phys: [id number] [place]  1. Left ventricular ejection fraction, by estimation, is 50 to 55%. The left ventricle has low normal function. The left ventricle has no regional wall motion abnormalities. There is mild left ventricular hypertrophy. Left ventricular diastolic parameters are consistent with Grade II diastolic dysfunction (pseudonormalization). Elevated left ventricular end-diastolic pressure. The E/e' is 21.  2. Right ventricular systolic function is mildly reduced. The right ventricular size is normal. There is moderately  elevated pulmonary artery systolic pressure. The estimated right ventricular systolic pressure is 61.6 mmHg.  3. Left atrial size was severely dilated.  4. Right atrial size was moderately dilated.  5. The mitral valve is abnormal. Mild to moderate mitral valve regurgitation.  6. The tricuspid valve is abnormal. Tricuspid valve regurgitation is moderate to severe.  7. The aortic valve is calcified. Aortic valve regurgitation is trivial. Severe aortic valve stenosis. Aortic valve area, by VTI measures 0.60 cm. Aortic valve mean gradient measures 38.0 mmHg. Aortic valve Vmax measures 4.09 m/s. Peak gradient 67 mmHg. DI is 0.21.  8. The inferior vena cava is dilated in size with >50% respiratory variability, suggesting right atrial pressure of 8 mmHg. Comparison(s): No prior Echocardiogram. Conclusion(s)/Recommendation(s): Findings concerning for congestive heart failure and severe aortic stenosis, would recommend structural heart team evaluation. FINDINGS  Left Ventricle: Left  ventricular ejection fraction, by estimation, is 50 to 55%. The left ventricle has low normal function. The left ventricle has no regional wall motion abnormalities. The left ventricular internal cavity size was normal in size. There is mild left ventricular hypertrophy. Left ventricular diastolic parameters are consistent with Grade II diastolic dysfunction (pseudonormalization). Elevated left ventricular end-diastolic pressure. The E/e' is 21. Right Ventricle: The right ventricular size is normal. No increase in right ventricular wall thickness. Right ventricular systolic function is mildly reduced. There is moderately elevated pulmonary artery systolic pressure. The tricuspid regurgitant velocity is 3.32 m/s, and with an assumed right atrial pressure of 8 mmHg, the estimated right ventricular systolic pressure is 19.4 mmHg. Left Atrium: Left atrial size was severely dilated. Right Atrium: Right atrial size was moderately dilated.  Pericardium: There is no evidence of pericardial effusion. Mitral Valve: The mitral valve is abnormal. Moderately decreased mobility of the posterior mitral valve leaflet. Mild to moderate mitral annular calcification. Mild to moderate mitral valve regurgitation. Tricuspid Valve: The tricuspid valve is abnormal. Tricuspid valve regurgitation is moderate to severe. Aortic Valve: The aortic valve is calcified. Aortic valve regurgitation is trivial. Severe aortic stenosis is present. Aortic valve mean gradient measures 38.0 mmHg. Aortic valve peak gradient measures 66.9 mmHg. Aortic valve area, by VTI measures 0.60 cm. Pulmonic Valve: The pulmonic valve was grossly normal. Pulmonic valve regurgitation is trivial. Aorta: The aortic root and ascending aorta are structurally normal, with no evidence of dilitation. Venous: The inferior vena cava is dilated in size with greater than 50% respiratory variability, suggesting right atrial pressure of 8 mmHg. IAS/Shunts: No atrial level shunt detected by color flow Doppler.  LEFT VENTRICLE PLAX 2D LVIDd:         4.60 cm  Diastology LVIDs:         3.80 cm  LV e' medial:    5.22 cm/s LV PW:         1.30 cm  LV E/e' medial:  25.9 LV IVS:        1.20 cm  LV e' lateral:   8.05 cm/s LVOT diam:     1.90 cm  LV E/e' lateral: 16.8 LV SV:         62 LV SV Index:   35 LVOT Area:     2.84 cm  RIGHT VENTRICLE            IVC RV Basal diam:  3.90 cm    IVC diam: 2.30 cm RV Mid diam:    2.50 cm RV S prime:     7.40 cm/s TAPSE (M-mode): 1.8 cm LEFT ATRIUM              Index       RIGHT ATRIUM           Index LA diam:        5.30 cm  2.95 cm/m  RA Area:     22.30 cm LA Vol (A2C):   119.0 ml 66.21 ml/m RA Volume:   70.40 ml  39.17 ml/m LA Vol (A4C):   112.0 ml 62.31 ml/m LA Biplane Vol: 117.0 ml 65.09 ml/m  AORTIC VALVE AV Area (Vmax):    0.71 cm AV Area (Vmean):   0.67 cm AV Area (VTI):     0.60 cm AV Vmax:           409.00 cm/s AV Vmean:          284.250 cm/s AV VTI:  1.030  m AV Peak Grad:      66.9 mmHg AV Mean Grad:      38.0 mmHg LVOT Vmax:         103.00 cm/s LVOT Vmean:        67.100 cm/s LVOT VTI:          0.219 m LVOT/AV VTI ratio: 0.21  AORTA Ao Root diam: 3.00 cm Ao Asc diam:  3.20 cm MITRAL VALVE                 TRICUSPID VALVE MV Area (PHT): 3.53 cm      TR Peak grad:   44.1 mmHg MV Decel Time: 215 msec      TR Vmax:        332.00 cm/s MR Peak grad:    170.0 mmHg MR Mean grad:    106.0 mmHg  SHUNTS MR Vmax:         652.00 cm/s Systemic VTI:  0.22 m MR Vmean:        483.0 cm/s  Systemic Diam: 1.90 cm MR PISA:         1.57 cm MR PISA Eff ROA: 10 mm MR PISA Radius:  0.50 cm MV E velocity: 135.00 cm/s MV A velocity: 88.80 cm/s MV E/A ratio:  1.52 Lyman Bishop MD Electronically signed by Lyman Bishop MD Signature Date/Time: 06/08/2020/1:14:13 PM    Final    Medications: . iron sucrose 100 mg (06/08/20 1741)   . amiodarone  200 mg Oral Daily  . calcium acetate  1,334 mg Oral TID WC  . cephALEXin  250 mg Oral Daily  . latanoprost  1 drop Both Eyes QHS  . multivitamin  1 tablet Oral QHS  . saccharomyces boulardii  250 mg Oral Daily  . timolol  1 drop Both Eyes Daily     Dialysis Orders: Center: [facility], T,Th, S  4 hrs 180NRe 400/800 77.5 kg 2.0 K/ 2.0 Ca UFP 4 AVF -Heparin 1000 units IV TIW -Mircera 150 mcg IV q 2 weeks (last dose 05/28/2020 last HGB 10.6 06/04/2020) -Venofer 100 mg IV X 10 doses (1/10 doses given) Tsat 19 05/28/2020.   Assessment/Plan: 1.  L Hip fracture S/P mechanical fall-Seen by orthopedics. L hemiarthroplasty planned for later today. Per primary/Orthopedics.  2. Acute on chronic diastolic HF-Last EF 50 to 55% 06/08/2020. Had evidence of mild volume overload by exam/CXR 12/20. Volume excess improved post HD. Monitor closely. 3.  ESRD - T,Th,S. Will maintain MWF schedule this week for facilitate plan for L hip repair. Next HD 12/22 no heparin. Issues with cramping. Lowered machine temp-change to UFP 2. 4.  Hypertension- BP is volume  controlled. Better control today. No home BP meds. .  5.  Anemia  - HGB 11.2. ESA due 06/11/2020.Will order for 12/22. Continue OP Fe load.  6.  Metabolic bone disease - C Ca OK. Continue binders.  7.  Nutrition - Albumin OK. NPO at present Renal diet, renal vitamins, nepro when eating.  8. PAF-on coumadin. Management per primary/pharmacy.  9. Prolonged QTc in setting of RBBB-no benadryl or agents that could increase QTc   Blakeley Scheier H. Kiyra Slaubaugh NP-C 06/09/2020, 12:23 PM  [employer] [phone]

## 2020-06-09 NOTE — Plan of Care (Signed)

## 2020-06-09 NOTE — Anesthesia Procedure Notes (Signed)
Arterial Line Insertion Start/End12/21/2021 4:30 PM, 06/09/2020 4:42 PM Performed by: CRNA  Patient location: Pre-op. Preanesthetic checklist: patient identified, IV checked, site marked, risks and benefits discussed, surgical consent, monitors and equipment checked, pre-op evaluation, timeout performed and anesthesia consent Lidocaine 1% used for infiltration Right, radial was placed Catheter size: 20 G Hand hygiene performed , maximum sterile barriers used  and Seldinger technique used  Attempts: 2 Procedure performed without using ultrasound guided technique. Following insertion, dressing applied. Post procedure assessment: normal and unchanged  Patient tolerated the procedure well with no immediate complications.

## 2020-06-09 NOTE — Anesthesia Postprocedure Evaluation (Signed)
Anesthesia Post Note  Patient: [name]  Procedure(s) Performed: ARTHROPLASTY BIPOLAR HIP (HEMIARTHROPLASTY) (Left Hip)     Patient location during evaluation: PACU Anesthesia Type: General Level of consciousness: sedated Pain management: pain level controlled Vital Signs Assessment: post-procedure vital signs reviewed and stable Respiratory status: spontaneous breathing and respiratory function stable Cardiovascular status: stable Postop Assessment: no apparent nausea or vomiting Anesthetic complications: no   No complications documented.  Last Vitals:  Vitals:   06/09/20 2115 06/09/20 2135  BP: (!) 163/78 (!) 145/58  Pulse: 76 71  Resp: (!) 21 17  Temp: 36.6 C 36.5 C  SpO2: 99% 94%    Last Pain:  Vitals:   06/09/20 2135  TempSrc: Oral  PainSc:                  Tyana Butzer DANIEL

## 2020-06-09 NOTE — Progress Notes (Signed)
PROGRESS NOTE  Mora Pedraza JJK:[id number] DOB: 26-Mar-1946 DOA: 06/07/2020 PCP: No primary care provider on file.   LOS: 2 days   Brief Narrative / Interim history: 74 year old female, lives with daughter, uses cane while ambulating out of her house, PMH of dilated cardiomyopathy with EF of 20-25% (EF has since normalized) in the setting of sepsis in 3716 complicated by demand ischemia, cardiac catheterization 2014 showed normal coronaries, chronic diastolic CHF, PAF on Coumadin anticoagulation, moderate to severe aortic stenosis, tolerated TKR in 5/21 well, per cardiology may need consideration of AVR at some point, hyperlipidemia, essential hypertension, ESRD on TTS HD, GERD, migraine and glaucoma presented to the ED following a mechanical fall at home with head injury and left hip pain.  She reportedly was trying to close her window blinds when she turned around, lost her balance, fell hit her head on the windowsill and landed on her left hip.  In the ED, hypoxic to 88% on room air and placed on oxygen.  CT head negative for acute abnormalities.  X-rays demonstrated acute left femoral neck fracture with superior subluxation.  Orthopedics consulted and plan surgical fixation either 12/20 or 12/21 pending hemodialysis needs by Nephrology and preop clearance by Cardiology.   Subjective / 24h Interval events: She is doing well this morning, denies any chest pain, denies any shortness of breath.  She is awaiting surgery this afternoon.  Assessment & Plan: Principal Problem Acute left hip femoral neck fracture -status post mechanical fall at home, orthopedic surgery consulted and she will be taken for operative repair this afternoon.  Active Problems Acute hypoxic respiratory failure-related to acute on chronic diastolic CHF, patient on ESRD, she required 2 L on admission, underwent hemodialysis 12/20 and currently she is on room air. -Most recent 2D echo showed an EF 96-78%, grade 2 diastolic  dysfunction  Moderate severe arctic stenosis, moderate severe mitral regurg -watch carefully fluid status postoperatively.  Cardiology consulted  ESRD on TTS hemodialysis -Underwent dialysis on 12/20.  Nephrology consulted, appreciate input  Paroxysmal A. fib -Patient on Coumadin, she was given vitamin K in preparation for surgery.  Currently she is on sinus, continue amiodarone.  Essential hypertension -Likely worse due to pain. Continue hydralazine prn  Anemia in the setting of chronic kidney disease/ESRD -Stable, follow hemoglobin postoperatively  Leukocytosis -Likely stress margination in the setting of fracture.  Monitor.  Chest x-ray abnormality -Per chest x-ray 12/19: Question focal osseous irregularity near the left coracoid process, favored to be secondary to patient positioning. -Requested orthopedics to weigh in, if this needs any further evaluation.  Moderate-sized hiatal hernia/GERD -Does not appear to be on any medications at home.   Scheduled Meds: . amiodarone  200 mg Oral Daily  . calcium acetate  1,334 mg Oral TID WC  . cephALEXin  250 mg Oral Daily  . latanoprost  1 drop Both Eyes QHS  . multivitamin  1 tablet Oral QHS  . saccharomyces boulardii  250 mg Oral Daily  . timolol  1 drop Both Eyes Daily   Continuous Infusions: . iron sucrose 100 mg (06/08/20 1741)   PRN Meds:.acetaminophen, hydrALAZINE, HYDROmorphone (DILAUDID) injection  Diet Orders (From admission, onward)    Start     Ordered   06/09/20 0001  Diet NPO time specified  Diet effective midnight        06/08/20 1528         DVT prophylaxis: SCDs Start: 06/07/20 2354    Code Status: Full Code  Family Communication: no family  at bedside   Status is: Inpatient  Remains inpatient appropriate because:Inpatient level of care appropriate due to severity of illness   Dispo: The patient is from: Home              Anticipated d/c is to: Home              Anticipated d/c date is: 3  days              Patient currently is not medically stable to d/c.  Consultants:  None   Procedures:  None   Microbiology  None   Antimicrobials: None     Objective: Vitals:   06/08/20 1905 06/08/20 1953 06/09/20 0300 06/09/20 0425  BP: (!) 146/55 (!) 123/53 119/61   Pulse: 76 76 71   Resp: 18 16 16    Temp: 97.7 F (36.5 C) 98.4 F (36.9 C) 98.1 F (36.7 C)   TempSrc: Oral Oral Oral   SpO2: 98% 91% 90%   Weight:    76.8 kg  Height:        Intake/Output Summary (Last 24 hours) at 06/09/2020 0747 Last data filed at 06/09/2020 0400 Gross per 24 hour  Intake 100 ml  Output 800 ml  Net -700 ml   Filed Weights   06/08/20 1405 06/08/20 1820 06/09/20 0425  Weight: 78 kg 77.2 kg 76.8 kg    Examination:  Constitutional: NAD Eyes: no scleral icterus ENMT: Mucous membranes are moist.  Neck: normal, supple Respiratory: clear to auscultation bilaterally, no wheezing, no crackles. Normal respiratory effort. Cardiovascular: Regular rate and rhythm, 3/6 SEM, no edema Abdomen: non distended, no tenderness. Bowel sounds positive.  Musculoskeletal: no clubbing / cyanosis.  Skin: no rashes Neurologic: non focal    Data Reviewed: I have independently reviewed following labs and imaging studies   CBC: Recent Labs  Lab 06/07/20 2020 06/07/20 2046 06/08/20 0525 06/09/20 0410  WBC 14.7*  --  14.0* 11.4*  NEUTROABS 13.5*  --   --   --   HGB 11.7* 12.9 11.0* 11.2*  HCT 38.8 38.0 36.9 35.8*  MCV 98.5  --  99.5 99.4  PLT 230  --  220 481   Basic Metabolic Panel: Recent Labs  Lab 06/07/20 2020 06/07/20 2046 06/08/20 1456 06/09/20 0410  NA 139 137 137 139  K 4.5 4.3 5.5* 4.1  CL 95* 97* 94* 98  CO2 29  --  28 28  GLUCOSE 123* 116* 111* 101*  BUN 31* 33* 41* 19  CREATININE 5.41* 5.30* 6.59* 4.15*  CALCIUM 9.1  --  9.0 8.8*  PHOS  --   --  5.0*  --    Liver Function Tests: Recent Labs  Lab 06/07/20 2020 06/08/20 1456  AST 21  --   ALT 17  --   ALKPHOS  97  --   BILITOT 1.3*  --   PROT 6.3*  --   ALBUMIN 3.3* 2.9*   Coagulation Profile: Recent Labs  Lab 06/07/20 2020 06/08/20 0525  INR 2.4* 1.6*   HbA1C: No results for input(s): HGBA1C in the last 72 hours. CBG: Recent Labs  Lab 06/08/20 2019  GLUCAP 101*    Recent Results (from the past 240 hour(s))  Resp Panel by RT-PCR (Flu A&B, Covid) Nasopharyngeal Swab     Status: None   Collection Time: 06/07/20  8:43 PM   Specimen: Nasopharyngeal Swab; Nasopharyngeal(NP) swabs in vial transport medium  Result Value Ref Range Status   SARS Coronavirus 2 by RT  PCR NEGATIVE NEGATIVE Final    Comment: (NOTE) SARS-CoV-2 target nucleic acids are NOT DETECTED.  The SARS-CoV-2 RNA is generally detectable in upper respiratory specimens during the acute phase of infection. The lowest concentration of SARS-CoV-2 viral copies this assay can detect is 138 copies/mL. A negative result does not preclude SARS-Cov-2 infection and should not be used as the sole basis for treatment or other patient management decisions. A negative result may occur with  improper specimen collection/handling, submission of specimen other than nasopharyngeal swab, presence of viral mutation(s) within the areas targeted by this assay, and inadequate number of viral copies(<138 copies/mL). A negative result must be combined with clinical observations, patient history, and epidemiological information. The expected result is Negative.  Fact Sheet for Patients:  EntrepreneurPulse.com.au  Fact Sheet for Healthcare Providers:  IncredibleEmployment.be  This test is no t yet approved or cleared by the [place] FDA and  has been authorized for detection and/or diagnosis of SARS-CoV-2 by FDA under an Emergency Use Authorization (EUA). This EUA will remain  in effect (meaning this test can be used) for the duration of the COVID-19 declaration under Section 564(b)(1) of the Act,  21 U.S.C.section 360bbb-3(b)(1), unless the authorization is terminated  or revoked sooner.       Influenza A by PCR NEGATIVE NEGATIVE Final   Influenza B by PCR NEGATIVE NEGATIVE Final    Comment: (NOTE) The Xpert Xpress SARS-CoV-2/FLU/RSV plus assay is intended as an aid in the diagnosis of influenza from Nasopharyngeal swab specimens and should not be used as a sole basis for treatment. Nasal washings and aspirates are unacceptable for Xpert Xpress SARS-CoV-2/FLU/RSV testing.  Fact Sheet for Patients: EntrepreneurPulse.com.au  Fact Sheet for Healthcare Providers: IncredibleEmployment.be  This test is not yet approved or cleared by the [place] FDA and has been authorized for detection and/or diagnosis of SARS-CoV-2 by FDA under an Emergency Use Authorization (EUA). This EUA will remain in effect (meaning this test can be used) for the duration of the COVID-19 declaration under Section 564(b)(1) of the Act, 21 U.S.C. section 360bbb-3(b)(1), unless the authorization is terminated or revoked.  Performed at [facility], [place] [address] [place], [place] [address]      Radiology Studies: ECHOCARDIOGRAM COMPLETE  Result Date: 06/08/2020    ECHOCARDIOGRAM REPORT   Patient Name:   [name] Date of Exam: 06/08/2020 Medical Rec #:  [id number]     Height:       62.0 in Accession #:    [id number]    Weight:       173.0 lb Date of Birth:  1946/04/12      BSA:          1.797 m Patient Age:    60 years      BP:           143/66 mmHg Patient Gender: F             HR:           73 bpm. Exam Location:  Inpatient Procedure: 2D Echo, Cardiac Doppler and Color Doppler Indications:    I50.33 Acute on chronic diastolic (congestive) heart failure  History:        Patient has no prior history of Echocardiogram examinations.  Sonographer:    Jonelle Sidle Dance Referring Phys: [id number] [place]  1. Left ventricular ejection fraction, by  estimation, is 50 to 55%. The left ventricle has low normal function. The left ventricle has no regional wall motion  abnormalities. There is mild left ventricular hypertrophy. Left ventricular diastolic parameters are consistent with Grade II diastolic dysfunction (pseudonormalization). Elevated left ventricular end-diastolic pressure. The E/e' is 21.  2. Right ventricular systolic function is mildly reduced. The right ventricular size is normal. There is moderately elevated pulmonary artery systolic pressure. The estimated right ventricular systolic pressure is 32.9 mmHg.  3. Left atrial size was severely dilated.  4. Right atrial size was moderately dilated.  5. The mitral valve is abnormal. Mild to moderate mitral valve regurgitation.  6. The tricuspid valve is abnormal. Tricuspid valve regurgitation is moderate to severe.  7. The aortic valve is calcified. Aortic valve regurgitation is trivial. Severe aortic valve stenosis. Aortic valve area, by VTI measures 0.60 cm. Aortic valve mean gradient measures 38.0 mmHg. Aortic valve Vmax measures 4.09 m/s. Peak gradient 67 mmHg. DI is 0.21.  8. The inferior vena cava is dilated in size with >50% respiratory variability, suggesting right atrial pressure of 8 mmHg. Comparison(s): No prior Echocardiogram. Conclusion(s)/Recommendation(s): Findings concerning for congestive heart failure and severe aortic stenosis, would recommend structural heart team evaluation. FINDINGS  Left Ventricle: Left ventricular ejection fraction, by estimation, is 50 to 55%. The left ventricle has low normal function. The left ventricle has no regional wall motion abnormalities. The left ventricular internal cavity size was normal in size. There is mild left ventricular hypertrophy. Left ventricular diastolic parameters are consistent with Grade II diastolic dysfunction (pseudonormalization). Elevated left ventricular end-diastolic pressure. The E/e' is 21. Right Ventricle: The right  ventricular size is normal. No increase in right ventricular wall thickness. Right ventricular systolic function is mildly reduced. There is moderately elevated pulmonary artery systolic pressure. The tricuspid regurgitant velocity is 3.32 m/s, and with an assumed right atrial pressure of 8 mmHg, the estimated right ventricular systolic pressure is 51.8 mmHg. Left Atrium: Left atrial size was severely dilated. Right Atrium: Right atrial size was moderately dilated. Pericardium: There is no evidence of pericardial effusion. Mitral Valve: The mitral valve is abnormal. Moderately decreased mobility of the posterior mitral valve leaflet. Mild to moderate mitral annular calcification. Mild to moderate mitral valve regurgitation. Tricuspid Valve: The tricuspid valve is abnormal. Tricuspid valve regurgitation is moderate to severe. Aortic Valve: The aortic valve is calcified. Aortic valve regurgitation is trivial. Severe aortic stenosis is present. Aortic valve mean gradient measures 38.0 mmHg. Aortic valve peak gradient measures 66.9 mmHg. Aortic valve area, by VTI measures 0.60 cm. Pulmonic Valve: The pulmonic valve was grossly normal. Pulmonic valve regurgitation is trivial. Aorta: The aortic root and ascending aorta are structurally normal, with no evidence of dilitation. Venous: The inferior vena cava is dilated in size with greater than 50% respiratory variability, suggesting right atrial pressure of 8 mmHg. IAS/Shunts: No atrial level shunt detected by color flow Doppler.  LEFT VENTRICLE PLAX 2D LVIDd:         4.60 cm  Diastology LVIDs:         3.80 cm  LV e' medial:    5.22 cm/s LV PW:         1.30 cm  LV E/e' medial:  25.9 LV IVS:        1.20 cm  LV e' lateral:   8.05 cm/s LVOT diam:     1.90 cm  LV E/e' lateral: 16.8 LV SV:         62 LV SV Index:   35 LVOT Area:     2.84 cm  RIGHT VENTRICLE  IVC RV Basal diam:  3.90 cm    IVC diam: 2.30 cm RV Mid diam:    2.50 cm RV S prime:     7.40 cm/s TAPSE  (M-mode): 1.8 cm LEFT ATRIUM              Index       RIGHT ATRIUM           Index LA diam:        5.30 cm  2.95 cm/m  RA Area:     22.30 cm LA Vol (A2C):   119.0 ml 66.21 ml/m RA Volume:   70.40 ml  39.17 ml/m LA Vol (A4C):   112.0 ml 62.31 ml/m LA Biplane Vol: 117.0 ml 65.09 ml/m  AORTIC VALVE AV Area (Vmax):    0.71 cm AV Area (Vmean):   0.67 cm AV Area (VTI):     0.60 cm AV Vmax:           409.00 cm/s AV Vmean:          284.250 cm/s AV VTI:            1.030 m AV Peak Grad:      66.9 mmHg AV Mean Grad:      38.0 mmHg LVOT Vmax:         103.00 cm/s LVOT Vmean:        67.100 cm/s LVOT VTI:          0.219 m LVOT/AV VTI ratio: 0.21  AORTA Ao Root diam: 3.00 cm Ao Asc diam:  3.20 cm MITRAL VALVE                 TRICUSPID VALVE MV Area (PHT): 3.53 cm      TR Peak grad:   44.1 mmHg MV Decel Time: 215 msec      TR Vmax:        332.00 cm/s MR Peak grad:    170.0 mmHg MR Mean grad:    106.0 mmHg  SHUNTS MR Vmax:         652.00 cm/s Systemic VTI:  0.22 m MR Vmean:        483.0 cm/s  Systemic Diam: 1.90 cm MR PISA:         1.57 cm MR PISA Eff ROA: 10 mm MR PISA Radius:  0.50 cm MV E velocity: 135.00 cm/s MV A velocity: 88.80 cm/s MV E/A ratio:  1.52 Lyman Bishop MD Electronically signed by Lyman Bishop MD Signature Date/Time: 06/08/2020/1:14:13 PM    Final     Marzetta Board, MD, PhD [facility]  Between 7 am - 7 pm I am available, please contact me via Amion or Securechat  Between 7 pm - 7 am I am not available, please contact night coverage MD/APP via Amion

## 2020-06-09 NOTE — Transfer of Care (Signed)
Immediate Anesthesia Transfer of Care Note  Patient: [name]  Procedure(s) Performed: ARTHROPLASTY BIPOLAR HIP (HEMIARTHROPLASTY) (Left Hip)  Patient Location: PACU  Anesthesia Type:General  Level of Consciousness: awake, alert  and oriented  Airway & Oxygen Therapy: Patient Spontanous Breathing and Patient connected to nasal cannula oxygen  Post-op Assessment: Report given to RN, Post -op Vital signs reviewed and stable and Patient moving all extremities  Post vital signs: Reviewed and stable  Last Vitals:  Vitals Value Taken Time  BP 157/64 06/09/20 2014  Temp    Pulse 77 06/09/20 2021  Resp 17 06/09/20 2021  SpO2 100 % 06/09/20 2021  Vitals shown include unvalidated device data.  Last Pain:  Vitals:   06/09/20 0752  TempSrc: Oral  PainSc:       Patients Stated Pain Goal: 0 (40/08/67 6195)  Complications: No complications documented.

## 2020-06-10 DIAGNOSIS — K219 Gastro-esophageal reflux disease without esophagitis: Secondary | ICD-10-CM | POA: Diagnosis not present

## 2020-06-10 DIAGNOSIS — E781 Pure hyperglyceridemia: Secondary | ICD-10-CM | POA: Diagnosis not present

## 2020-06-10 DIAGNOSIS — I129 Hypertensive chronic kidney disease with stage 1 through stage 4 chronic kidney disease, or unspecified chronic kidney disease: Secondary | ICD-10-CM | POA: Diagnosis not present

## 2020-06-10 DIAGNOSIS — I480 Paroxysmal atrial fibrillation: Secondary | ICD-10-CM | POA: Diagnosis not present

## 2020-06-10 DIAGNOSIS — Z7901 Long term (current) use of anticoagulants: Secondary | ICD-10-CM | POA: Diagnosis not present

## 2020-06-10 DIAGNOSIS — J9601 Acute respiratory failure with hypoxia: Secondary | ICD-10-CM | POA: Diagnosis not present

## 2020-06-10 DIAGNOSIS — S72002A Fracture of unspecified part of neck of left femur, initial encounter for closed fracture: Secondary | ICD-10-CM | POA: Diagnosis not present

## 2020-06-10 LAB — BASIC METABOLIC PANEL
Anion gap: 17 — ABNORMAL HIGH (ref 5–15)
BUN: 35 mg/dL — ABNORMAL HIGH (ref 8–23)
CO2: 24 mmol/L (ref 22–32)
Calcium: 8.8 mg/dL — ABNORMAL LOW (ref 8.9–10.3)
Chloride: 97 mmol/L — ABNORMAL LOW (ref 98–111)
Creatinine, Ser: 5.85 mg/dL — ABNORMAL HIGH (ref 0.44–1.00)
GFR, Estimated: 7 mL/min — ABNORMAL LOW (ref >60)
Glucose, Bld: 120 mg/dL — ABNORMAL HIGH (ref 70–99)
Potassium: 4.9 mmol/L (ref 3.5–5.1)
Sodium: 138 mmol/L (ref 135–145)

## 2020-06-10 LAB — CBC
HCT: 32.2 % — ABNORMAL LOW (ref 36.0–46.0)
Hemoglobin: 10 g/dL — ABNORMAL LOW (ref 12.0–15.0)
MCH: 30.3 pg (ref 26.0–34.0)
MCHC: 31.1 g/dL (ref 30.0–36.0)
MCV: 97.6 fL (ref 80.0–100.0)
Platelets: 203 10*3/uL (ref 150–400)
RBC: 3.3 MIL/uL — ABNORMAL LOW (ref 3.87–5.11)
RDW: 17.2 % — ABNORMAL HIGH (ref 11.5–15.5)
WBC: 10.9 10*3/uL — ABNORMAL HIGH (ref 4.0–10.5)
nRBC: 0 % (ref 0.0–0.2)

## 2020-06-10 LAB — URINE CULTURE

## 2020-06-10 LAB — HEPARIN LEVEL (UNFRACTIONATED): Heparin Unfractionated: 0.11 IU/mL — ABNORMAL LOW (ref 0.30–0.70)

## 2020-06-10 LAB — C DIFFICILE (CDIFF) QUICK SCRN (NO PCR REFLEX)
C Diff antigen: POSITIVE — AB
C Diff toxin: NEGATIVE

## 2020-06-10 LAB — PROTIME-INR
INR: 1.3 — ABNORMAL HIGH (ref 0.8–1.2)
Prothrombin Time: 15.5 seconds — ABNORMAL HIGH (ref 11.4–15.2)

## 2020-06-10 MED ORDER — PENTAFLUOROPROP-TETRAFLUOROETH EX AERO: 1.0000 "application " | INHALATION_SPRAY | CUTANEOUS | Status: DC | PRN

## 2020-06-10 MED ORDER — HEPARIN (PORCINE) 25000 UT/250ML-% IV SOLN
1750.0000 [IU]/h | INTRAVENOUS | Status: DC
  Administered 2020-06-10: 1250 [IU]/h via INTRAVENOUS
  Administered 2020-06-12: 1800 [IU]/h via INTRAVENOUS
  Administered 2020-06-12: 1750 [IU]/h via INTRAVENOUS
  Filled 2020-06-10 (×7): qty 250

## 2020-06-10 MED ORDER — PHENYLEPHRINE 40 MCG/ML (10ML) SYRINGE FOR IV PUSH (FOR BLOOD PRESSURE SUPPORT)
PREFILLED_SYRINGE | INTRAVENOUS | Status: AC
  Filled 2020-06-10: qty 10

## 2020-06-10 MED ORDER — LIDOCAINE HCL (PF) 1 % IJ SOLN: 5.0000 mL | INTRAMUSCULAR | Status: DC | PRN

## 2020-06-10 MED ORDER — WARFARIN SODIUM 5 MG PO TABS
5.0000 mg | ORAL_TABLET | Freq: Once | ORAL | Status: AC
  Administered 2020-06-10: 5 mg via ORAL
  Filled 2020-06-10 (×2): qty 1

## 2020-06-10 MED ORDER — SODIUM CHLORIDE 0.9 % IV SOLN: 100.0000 mL | INTRAVENOUS | Status: DC | PRN

## 2020-06-10 MED ORDER — LIDOCAINE-PRILOCAINE 2.5-2.5 % EX CREA: 1.0000 "application " | TOPICAL_CREAM | CUTANEOUS | Status: DC | PRN

## 2020-06-10 NOTE — Progress Notes (Signed)
Subjective: 1 Day Post-Op s/p Procedure(s): ARTHROPLASTY BIPOLAR HIP (HEMIARTHROPLASTY)  Patient states pain was well controlled overnight. No other complaints this AM,  Objective:  PE: VITALS:   Vitals:   06/09/20 2315 06/10/20 0315 06/10/20 0402 06/10/20 0754  BP: 132/61 111/64  136/79  Pulse: 71 77  73  Resp: 16 15  18   Temp: 97.9 F (36.6 C) 98.7 F (37.1 C)  98.2 F (36.8 C)  TempSrc: Oral Oral  Oral  SpO2: 90% 91%  (!) 89%  Weight:   76.5 kg   Height:       General: alert, oriented, sitting up in bed, in no acute distress GI: abdomen soft, nontender MSK: LLE - dorsiflexion and plantarflexion intact. Able to move all toes. Distal sensation intact. Foot warm and well perfused. Mild TTP to left hip. Dressing CDI.    LABS  Results for orders placed or performed during the hospital encounter of 06/07/20 (from the past 24 hour(s))  Protime-INR     Status: Abnormal   Collection Time: 06/10/20  2:43 AM  Result Value Ref Range   Prothrombin Time 15.5 (H) 11.4 - 15.2 seconds   INR 1.3 (H) 0.8 - 1.2  CBC     Status: Abnormal   Collection Time: 06/10/20  2:43 AM  Result Value Ref Range   WBC 10.9 (H) 4.0 - 10.5 K/uL   RBC 3.30 (L) 3.87 - 5.11 MIL/uL   Hemoglobin 10.0 (L) 12.0 - 15.0 g/dL   HCT 32.2 (L) 36.0 - 46.0 %   MCV 97.6 80.0 - 100.0 fL   MCH 30.3 26.0 - 34.0 pg   MCHC 31.1 30.0 - 36.0 g/dL   RDW 17.2 (H) 11.5 - 15.5 %   Platelets 203 150 - 400 K/uL   nRBC 0.0 0.0 - 0.2 %  Basic metabolic panel     Status: Abnormal   Collection Time: 06/10/20  2:43 AM  Result Value Ref Range   Sodium 138 135 - 145 mmol/L   Potassium 4.9 3.5 - 5.1 mmol/L   Chloride 97 (L) 98 - 111 mmol/L   CO2 24 22 - 32 mmol/L   Glucose, Bld 120 (H) 70 - 99 mg/dL   BUN 35 (H) 8 - 23 mg/dL   Creatinine, Ser 5.85 (H) 0.44 - 1.00 mg/dL   Calcium 8.8 (L) 8.9 - 10.3 mg/dL   GFR, Estimated 7 (L) >60 mL/min   Anion gap 17 (H) 5 - 15    ECHOCARDIOGRAM COMPLETE  Result Date:  06/08/2020    ECHOCARDIOGRAM REPORT   Patient Name:   [name] Date of Exam: 06/08/2020 Medical Rec #:  [id number]     Height:       62.0 in Accession #:    [id number]    Weight:       173.0 lb Date of Birth:  1946/02/27      BSA:          1.797 m Patient Age:    74 years      BP:           143/66 mmHg Patient Gender: F             HR:           73 bpm. Exam Location:  Inpatient Procedure: 2D Echo, Cardiac Doppler and Color Doppler Indications:    I50.33 Acute on chronic diastolic (congestive) heart failure  History:        Patient has no prior  history of Echocardiogram examinations.  Sonographer:    Jonelle Sidle Dance Referring Phys: [id number] [place]  1. Left ventricular ejection fraction, by estimation, is 50 to 55%. The left ventricle has low normal function. The left ventricle has no regional wall motion abnormalities. There is mild left ventricular hypertrophy. Left ventricular diastolic parameters are consistent with Grade II diastolic dysfunction (pseudonormalization). Elevated left ventricular end-diastolic pressure. The E/e' is 21.  2. Right ventricular systolic function is mildly reduced. The right ventricular size is normal. There is moderately elevated pulmonary artery systolic pressure. The estimated right ventricular systolic pressure is 56.8 mmHg.  3. Left atrial size was severely dilated.  4. Right atrial size was moderately dilated.  5. The mitral valve is abnormal. Mild to moderate mitral valve regurgitation.  6. The tricuspid valve is abnormal. Tricuspid valve regurgitation is moderate to severe.  7. The aortic valve is calcified. Aortic valve regurgitation is trivial. Severe aortic valve stenosis. Aortic valve area, by VTI measures 0.60 cm. Aortic valve mean gradient measures 38.0 mmHg. Aortic valve Vmax measures 4.09 m/s. Peak gradient 67 mmHg. DI is 0.21.  8. The inferior vena cava is dilated in size with >50% respiratory variability, suggesting right atrial pressure of 8  mmHg. Comparison(s): No prior Echocardiogram. Conclusion(s)/Recommendation(s): Findings concerning for congestive heart failure and severe aortic stenosis, would recommend structural heart team evaluation. FINDINGS  Left Ventricle: Left ventricular ejection fraction, by estimation, is 50 to 55%. The left ventricle has low normal function. The left ventricle has no regional wall motion abnormalities. The left ventricular internal cavity size was normal in size. There is mild left ventricular hypertrophy. Left ventricular diastolic parameters are consistent with Grade II diastolic dysfunction (pseudonormalization). Elevated left ventricular end-diastolic pressure. The E/e' is 21. Right Ventricle: The right ventricular size is normal. No increase in right ventricular wall thickness. Right ventricular systolic function is mildly reduced. There is moderately elevated pulmonary artery systolic pressure. The tricuspid regurgitant velocity is 3.32 m/s, and with an assumed right atrial pressure of 8 mmHg, the estimated right ventricular systolic pressure is 12.7 mmHg. Left Atrium: Left atrial size was severely dilated. Right Atrium: Right atrial size was moderately dilated. Pericardium: There is no evidence of pericardial effusion. Mitral Valve: The mitral valve is abnormal. Moderately decreased mobility of the posterior mitral valve leaflet. Mild to moderate mitral annular calcification. Mild to moderate mitral valve regurgitation. Tricuspid Valve: The tricuspid valve is abnormal. Tricuspid valve regurgitation is moderate to severe. Aortic Valve: The aortic valve is calcified. Aortic valve regurgitation is trivial. Severe aortic stenosis is present. Aortic valve mean gradient measures 38.0 mmHg. Aortic valve peak gradient measures 66.9 mmHg. Aortic valve area, by VTI measures 0.60 cm. Pulmonic Valve: The pulmonic valve was grossly normal. Pulmonic valve regurgitation is trivial. Aorta: The aortic root and ascending aorta  are structurally normal, with no evidence of dilitation. Venous: The inferior vena cava is dilated in size with greater than 50% respiratory variability, suggesting right atrial pressure of 8 mmHg. IAS/Shunts: No atrial level shunt detected by color flow Doppler.  LEFT VENTRICLE PLAX 2D LVIDd:         4.60 cm  Diastology LVIDs:         3.80 cm  LV e' medial:    5.22 cm/s LV PW:         1.30 cm  LV E/e' medial:  25.9 LV IVS:        1.20 cm  LV e' lateral:   8.05 cm/s LVOT diam:  1.90 cm  LV E/e' lateral: 16.8 LV SV:         62 LV SV Index:   35 LVOT Area:     2.84 cm  RIGHT VENTRICLE            IVC RV Basal diam:  3.90 cm    IVC diam: 2.30 cm RV Mid diam:    2.50 cm RV S prime:     7.40 cm/s TAPSE (M-mode): 1.8 cm LEFT ATRIUM              Index       RIGHT ATRIUM           Index LA diam:        5.30 cm  2.95 cm/m  RA Area:     22.30 cm LA Vol (A2C):   119.0 ml 66.21 ml/m RA Volume:   70.40 ml  39.17 ml/m LA Vol (A4C):   112.0 ml 62.31 ml/m LA Biplane Vol: 117.0 ml 65.09 ml/m  AORTIC VALVE AV Area (Vmax):    0.71 cm AV Area (Vmean):   0.67 cm AV Area (VTI):     0.60 cm AV Vmax:           409.00 cm/s AV Vmean:          284.250 cm/s AV VTI:            1.030 m AV Peak Grad:      66.9 mmHg AV Mean Grad:      38.0 mmHg LVOT Vmax:         103.00 cm/s LVOT Vmean:        67.100 cm/s LVOT VTI:          0.219 m LVOT/AV VTI ratio: 0.21  AORTA Ao Root diam: 3.00 cm Ao Asc diam:  3.20 cm MITRAL VALVE                 TRICUSPID VALVE MV Area (PHT): 3.53 cm      TR Peak grad:   44.1 mmHg MV Decel Time: 215 msec      TR Vmax:        332.00 cm/s MR Peak grad:    170.0 mmHg MR Mean grad:    106.0 mmHg  SHUNTS MR Vmax:         652.00 cm/s Systemic VTI:  0.22 m MR Vmean:        483.0 cm/s  Systemic Diam: 1.90 cm MR PISA:         1.57 cm MR PISA Eff ROA: 10 mm MR PISA Radius:  0.50 cm MV E velocity: 135.00 cm/s MV A velocity: 88.80 cm/s MV E/A ratio:  1.52 Lyman Bishop MD Electronically signed by Lyman Bishop MD Signature  Date/Time: 06/08/2020/1:14:13 PM    Final    DG Hip Port Unilat With Pelvis 1V Left  Result Date: 06/09/2020 CLINICAL DATA:  Status post fracture open reduction and internal fixation. EXAM: DG HIP (WITH OR WITHOUT PELVIS) 1V PORT LEFT COMPARISON:  None. FINDINGS: There is no evidence of an acute hip fracture or dislocation. A left hip replacement is noted. There is no evidence of surrounding lucency to suggest the presence of hardware loosening or infection. A mild to moderate amount of soft tissue air is seen adjacent to the proximal and medial portions of the right femoral shaft. Moderate severity lateral left hip soft tissue swelling is also seen. IMPRESSION: 1. Left hip replacement without evidence of hardware loosening or infection.  Electronically Signed   By: Virgina Norfolk M.D.   On: 06/09/2020 20:49    Assessment/Plan: Principal Problem:   Hip fracture (HCC) Active Problems:   Leukocytosis   Acute hypoxemic respiratory failure (HCC)   Atrial fibrillation (HCC)   ESRD (end stage renal disease) ([place])  1 Day Post-Op s/p Procedure(s): ARTHROPLASTY BIPOLAR HIP (HEMIARTHROPLASTY)  Weightbearing: WBAT LLE Insicional and dressing care: Reinforce dressings as needed, dressing CDI this am VTE prophylaxis: patient on Coumadin at baseline. Hbg 10 this am, spoke with medicine regarding bridging options post operatively given that cardiology recommended bridging.  Pain control: continue current regimen, patient tolerated this regimen well with TKA done this past spring Follow - up plan: 2 weeks with Dr. Mardelle Matte Dispo: HD this am, pending PT eval  Contact information:   Weekdays 8-5 Merlene Pulling, PA-C [phone] A fter hours and holidays please check [email or web address] for group call information for [facility]  Ventura Bruns 06/10/2020, 8:35 AM

## 2020-06-10 NOTE — Progress Notes (Signed)
ANTICOAGULATION CONSULT NOTE - Initial Consult  Pharmacy Consult for Warfarin and heparin Indication:  Atrial fibrillation  Allergies  Allergen Reactions  . Percocet [Oxycodone-Acetaminophen] Nausea And Vomiting  . Vicodin [Hydrocodone-Acetaminophen] Nausea And Vomiting  . Chlorhexidine Rash    Pt states she gets rash similar to "sunburn"    Patient Measurements: Height: 5\' 2"  (157.5 cm) Weight:  (76.4) IBW/kg (Calculated) : 50.1  Vital Signs: Temp: 98.9 F (37.2 C) (12/22 0830) Temp Source: Oral (12/22 0830) BP: 157/66 (12/22 0930) Pulse Rate: 71 (12/22 0930)  Labs: Recent Labs    06/08/20 0525 06/08/20 1456 06/09/20 0410 06/09/20 0742 06/10/20 0243  HGB 11.0*  --  11.2*  --  10.0*  HCT 36.9  --  35.8*  --  32.2*  PLT 220  --  201  --  203  LABPROT 18.7*  --   --  15.9* 15.5*  INR 1.6*  --   --  1.3* 1.3*  CREATININE  --  6.59* 4.15*  --  5.85*    Estimated Creatinine Clearance: 8 mL/min (A) (by C-G formula based on SCr of 5.85 mg/dL (H)).   Medical History: History reviewed. No pertinent past medical history.  Assessment: 74 yr old female admitted on 06/07/20 with L femoral neck fracture; pt is S/P hemiarthroplasty this evening.  Pt was taking warfarin at home PTA for atrial fibrillation (5 mg on Tues/Thurs, 2.5 mg on other days); INR on admission was 2.4, INR this AM was down to 1.3. Warfarin was held since admission for surgery, and pt was given vitamin K 5 mg IV X 1 on 12/19. Pharmacy is consulted to resume warfarin dosing for atrial fibrillation.  H/H 10/32.2, platelets 203; albumin 2.9. Pt was on amiodarone PTA, which is continued during this admission. .  Patient now to be started on heparin drip to bridge to warfarin. INR again 1.3 today.   Goal of Therapy:  INR 2-3  Heparin level 0.3-0.7 Monitor platelets by anticoagulation protocol: Yes   Plan:  Warfarin 5 mg PO X 1 tonight Heparin at 1250 units/hr, no bolus Heparin level in 8 hours Monitor  daily heparin level, INR, CBC Monitor for signs/symptoms of bleeding  Hopie Pellegrin A. Levada Dy, PharmD, BCPS, FNKF Clinical Pharmacist [place] Please utilize Amion for appropriate phone number to reach the unit pharmacist ([place])   06/10/2020,9:49 AM

## 2020-06-10 NOTE — Progress Notes (Signed)
PT Cancellation Note  Patient Details Name: Jo-Ann Johanning MRN: [id number] DOB: 05/15/1946   Cancelled Treatment:    Reason Eval/Treat Not Completed: Patient at procedure or test/unavailable Patient off unit at HD. PT will re-attempt eval as time allows.   Perrin Maltese, PT, DPT Acute Rehabilitation Services Pager [phone] Office [phone]    Alda Lea 06/10/2020, 1:03 PM

## 2020-06-10 NOTE — Progress Notes (Signed)
ANTICOAGULATION CONSULT NOTE - Follow Up Consult  Pharmacy Consult for heparin Indication: atrial fibrillation  Labs: Recent Labs    06/08/20 0525 06/08/20 1456 06/09/20 0410 06/09/20 0742 06/10/20 0243 06/10/20 2202  HGB 11.0*  --  11.2*  --  10.0*  --   HCT 36.9  --  35.8*  --  32.2*  --   PLT 220  --  201  --  203  --   LABPROT 18.7*  --   --  15.9* 15.5*  --   INR 1.6*  --   --  1.3* 1.3*  --   HEPARINUNFRC  --   --   --   --   --  0.11*  CREATININE  --  6.59* 4.15*  --  5.85*  --     Assessment: 74yo female subtherapeutic on heparin with initial dosing while INR below goal; no gtt issues or signs of bleeding per RN.  Goal of Therapy:  Heparin level 0.3-0.7 units/ml   Plan:  Will increase heparin gtt by 3-4 units/kg/hr to 1500 units/hr and check level in 8 hours.    Wynona Neat, PharmD, BCPS  06/10/2020,11:26 PM

## 2020-06-10 NOTE — Progress Notes (Signed)
PROGRESS NOTE  [name] VZD:[id number] DOB: 1945-09-21 DOA: 06/07/2020 PCP: No primary care provider on file.   LOS: 3 days   Brief Narrative / Interim history: 74 year old female, lives with daughter, uses cane while ambulating out of her house, PMH of dilated cardiomyopathy with EF of 20-25% (EF has since normalized) in the setting of sepsis in 2951 complicated by demand ischemia, cardiac catheterization 2014 showed normal coronaries, chronic diastolic CHF, PAF on Coumadin anticoagulation, moderate to severe aortic stenosis, tolerated TKR in 5/21 well, per cardiology may need consideration of AVR at some point, hyperlipidemia, essential hypertension, ESRD on TTS HD, GERD, migraine and glaucoma presented to the ED following a mechanical fall at home with head injury and left hip pain.  She reportedly was trying to close her window blinds when she turned around, lost her balance, fell hit her head on the windowsill and landed on her left hip.  In the ED, hypoxic to 88% on room air and placed on oxygen.  CT head negative for acute abnormalities.  X-rays demonstrated acute left femoral neck fracture with superior subluxation.  Orthopedics consulted and s/p repair 12/21   Subjective / 24h Interval events: Had surgery last night, reports improved hip pain. No chest pain, no abdominal pain, no nausea/vomiting.   Assessment & Plan: Principal Problem Acute left hip femoral neck fracture -status post mechanical fall at home, orthopedic surgery consulted and she is s/p right hemiarthroplasty on 12/21  Active Problems Acute hypoxic respiratory failure-related to acute on chronic diastolic CHF, patient on ESRD, she required 2 L on admission, underwent hemodialysis 12/20 -Most recent 2D echo showed an EF 88-41%, grade 2 diastolic dysfunction -still on O2 this morning, weak to room air as tolerated. IS  Moderate severe arctic stenosis, moderate severe mitral regurg -watch carefully fluid status  postoperatively.  Cardiology consulted  ESRD on TTS hemodialysis -Underwent dialysis on 12/20.  Nephrology consulted, appreciate input. HD hopefully today   Paroxysmal A. fib -Patient on Coumadin, she was given vitamin K in preparation for surgery.  Currently she is on sinus, continue amiodarone. -will be bridged back per cardiology, will discuss with ortho if heparin infusion OK with them from surgical standpoint.   Essential hypertension -Likely worse due to pain. Continue hydralazine prn  Anemia in the setting of chronic kidney disease/ESRD -Stable, follow hemoglobin postoperatively  Leukocytosis -Likely stress margination in the setting of fracture.  Improving.  Chest x-ray abnormality -Per chest x-ray 12/19: Question focal osseous irregularity near the left coracoid process, favored to be secondary to patient positioning. -Requested orthopedics to weigh in, if this needs any further evaluation.  Moderate-sized hiatal hernia/GERD -Does not appear to be on any medications at home.   Scheduled Meds: . acetaminophen  1,000 mg Oral Q6H  . amiodarone  200 mg Oral Daily  . calcium acetate  1,334 mg Oral TID WC  . [START ON 06/11/2020] cephALEXin  250 mg Oral Daily  . enoxaparin (LOVENOX) injection  30 mg Subcutaneous Q24H  . latanoprost  1 drop Both Eyes QHS  . multivitamin  1 tablet Oral QHS  . saccharomyces boulardii  250 mg Oral Daily  . timolol  1 drop Both Eyes Daily  . Warfarin - Pharmacist Dosing Inpatient   Does not apply q1600   Continuous Infusions: . iron sucrose 100 mg (06/08/20 1741)  . methocarbamol (ROBAXIN) IV     PRN Meds:.acetaminophen, bisacodyl, hydrALAZINE, HYDROmorphone (DILAUDID) injection, menthol-cetylpyridinium **OR** phenol, methocarbamol **OR** methocarbamol (ROBAXIN) IV, ondansetron **OR** ondansetron (ZOFRAN) IV,  oxyCODONE, oxyCODONE, polyethylene glycol  Diet Orders (From admission, onward)    Start     Ordered   06/09/20 2153  Diet renal  with fluid restriction Fluid restriction: 1200 mL Fluid; Room service appropriate? Yes; Fluid consistency: Thin  Diet effective now       Question Answer Comment  Fluid restriction: 1200 mL Fluid   Room service appropriate? Yes   Fluid consistency: Thin      06/09/20 2152         DVT prophylaxis: enoxaparin (LOVENOX) injection 30 mg Start: 06/10/20 0800 SCDs Start: 06/09/20 2153 SCDs Start: 06/07/20 2354    Code Status: Full Code  Family Communication: no family at bedside   Status is: Inpatient  Remains inpatient appropriate because:Inpatient level of care appropriate due to severity of illness  Dispo: The patient is from: Home              Anticipated d/c is to: Home              Anticipated d/c date is: 3 days              Patient currently is not medically stable to d/c.  Consultants:  None   Procedures:  None   Microbiology  None   Antimicrobials: None     Objective: Vitals:   06/09/20 2224 06/09/20 2315 06/10/20 0315 06/10/20 0402  BP: (!) 157/63 132/61 111/64   Pulse: 74 71 77   Resp: 16 16 15    Temp: 97.6 F (36.4 C) 97.9 F (36.6 C) 98.7 F (37.1 C)   TempSrc: Oral Oral Oral   SpO2: 92% 90% 91%   Weight:    76.5 kg  Height:        Intake/Output Summary (Last 24 hours) at 06/10/2020 0725 Last data filed at 06/10/2020 0300 Gross per 24 hour  Intake 1100 ml  Output 400 ml  Net 700 ml   Filed Weights   06/08/20 1820 06/09/20 0425 06/10/20 0402  Weight: 77.2 kg 76.8 kg 76.5 kg    Examination:  Constitutional: NAD Eyes: no icterus  ENMT: mmm Neck: normal, supple Respiratory: CTA biL, no wheezing, no crackles Cardiovascular: RRR, 3/6 SEM, no edema Abdomen: soft, NT, ND, BS+ Musculoskeletal: no clubbing / cyanosis.  Skin: no rashes Neurologic: no focal deficits  Data Reviewed: I have independently reviewed following labs and imaging studies   CBC: Recent Labs  Lab 06/07/20 2020 06/07/20 2046 06/08/20 0525 06/09/20 0410  06/10/20 0243  WBC 14.7*  --  14.0* 11.4* 10.9*  NEUTROABS 13.5*  --   --   --   --   HGB 11.7* 12.9 11.0* 11.2* 10.0*  HCT 38.8 38.0 36.9 35.8* 32.2*  MCV 98.5  --  99.5 99.4 97.6  PLT 230  --  220 201 706   Basic Metabolic Panel: Recent Labs  Lab 06/07/20 2020 06/07/20 2046 06/08/20 1456 06/09/20 0410 06/10/20 0243  NA 139 137 137 139 138  K 4.5 4.3 5.5* 4.1 4.9  CL 95* 97* 94* 98 97*  CO2 29  --  28 28 24   GLUCOSE 123* 116* 111* 101* 120*  BUN 31* 33* 41* 19 35*  CREATININE 5.41* 5.30* 6.59* 4.15* 5.85*  CALCIUM 9.1  --  9.0 8.8* 8.8*  PHOS  --   --  5.0*  --   --    Liver Function Tests: Recent Labs  Lab 06/07/20 2020 06/08/20 1456  AST 21  --   ALT 17  --  ALKPHOS 97  --   BILITOT 1.3*  --   PROT 6.3*  --   ALBUMIN 3.3* 2.9*   Coagulation Profile: Recent Labs  Lab 06/07/20 2020 06/08/20 0525 06/09/20 0742 06/10/20 0243  INR 2.4* 1.6* 1.3* 1.3*   HbA1C: No results for input(s): HGBA1C in the last 72 hours. CBG: Recent Labs  Lab 06/08/20 2019  GLUCAP 101*    Recent Results (from the past 240 hour(s))  Resp Panel by RT-PCR (Flu A&B, Covid) Nasopharyngeal Swab     Status: None   Collection Time: 06/07/20  8:43 PM   Specimen: Nasopharyngeal Swab; Nasopharyngeal(NP) swabs in vial transport medium  Result Value Ref Range Status   SARS Coronavirus 2 by RT PCR NEGATIVE NEGATIVE Final    Comment: (NOTE) SARS-CoV-2 target nucleic acids are NOT DETECTED.  The SARS-CoV-2 RNA is generally detectable in upper respiratory specimens during the acute phase of infection. The lowest concentration of SARS-CoV-2 viral copies this assay can detect is 138 copies/mL. A negative result does not preclude SARS-Cov-2 infection and should not be used as the sole basis for treatment or other patient management decisions. A negative result may occur with  improper specimen collection/handling, submission of specimen other than nasopharyngeal swab, presence of viral  mutation(s) within the areas targeted by this assay, and inadequate number of viral copies(<138 copies/mL). A negative result must be combined with clinical observations, patient history, and epidemiological information. The expected result is Negative.  Fact Sheet for Patients:  EntrepreneurPulse.com.au  Fact Sheet for Healthcare Providers:  IncredibleEmployment.be  This test is no t yet approved or cleared by the [place] FDA and  has been authorized for detection and/or diagnosis of SARS-CoV-2 by FDA under an Emergency Use Authorization (EUA). This EUA will remain  in effect (meaning this test can be used) for the duration of the COVID-19 declaration under Section 564(b)(1) of the Act, 21 U.S.C.section 360bbb-3(b)(1), unless the authorization is terminated  or revoked sooner.       Influenza A by PCR NEGATIVE NEGATIVE Final   Influenza B by PCR NEGATIVE NEGATIVE Final    Comment: (NOTE) The Xpert Xpress SARS-CoV-2/FLU/RSV plus assay is intended as an aid in the diagnosis of influenza from Nasopharyngeal swab specimens and should not be used as a sole basis for treatment. Nasal washings and aspirates are unacceptable for Xpert Xpress SARS-CoV-2/FLU/RSV testing.  Fact Sheet for Patients: EntrepreneurPulse.com.au  Fact Sheet for Healthcare Providers: IncredibleEmployment.be  This test is not yet approved or cleared by the [place] FDA and has been authorized for detection and/or diagnosis of SARS-CoV-2 by FDA under an Emergency Use Authorization (EUA). This EUA will remain in effect (meaning this test can be used) for the duration of the COVID-19 declaration under Section 564(b)(1) of the Act, 21 U.S.C. section 360bbb-3(b)(1), unless the authorization is terminated or revoked.  Performed at [facility], [place] [address], [place], [place] [address]      Radiology Studies: DG Hip Port  Unilat With Pelvis 1V Left  Result Date: 06/09/2020 CLINICAL DATA:  Status post fracture open reduction and internal fixation. EXAM: DG HIP (WITH OR WITHOUT PELVIS) 1V PORT LEFT COMPARISON:  None. FINDINGS: There is no evidence of an acute hip fracture or dislocation. A left hip replacement is noted. There is no evidence of surrounding lucency to suggest the presence of hardware loosening or infection. A mild to moderate amount of soft tissue air is seen adjacent to the proximal and medial portions of the right  femoral shaft. Moderate severity lateral left hip soft tissue swelling is also seen. IMPRESSION: 1. Left hip replacement without evidence of hardware loosening or infection. Electronically Signed   By: Virgina Norfolk M.D.   On: 06/09/2020 20:49    Marzetta Board, MD, PhD [facility]  Between 7 am - 7 pm I am available, please contact me via Amion or Securechat  Between 7 pm - 7 am I am not available, please contact night coverage MD/APP via Amion

## 2020-06-10 NOTE — Progress Notes (Signed)
[facility] Progress Note   Subjective: Seen on HD. Hip surgery last PM. Feels better. No issues with HD.   Objective Vitals:   06/10/20 0840 06/10/20 0900 06/10/20 0930 06/10/20 1000  BP: 137/69 (!) 154/68 (!) 157/66 (!) 162/69  Pulse: 70 71 71 78  Resp:      Temp:      TempSrc:      SpO2:      Weight:      Height:       Physical Exam General: Pleasant elderly female in NAD Heart:S1 K24/0 systolic M.  Lungs: CTAB Abdomen:Active BS, ND Extremities: Trace BLE edema. Drsg L hip intact Dialysis Access:L AVF aneurysmal cannulated   Additional Objective Labs: Basic Metabolic Panel: Recent Labs  Lab 06/08/20 1456 06/09/20 0410 06/10/20 0243  NA 137 139 138  K 5.5* 4.1 4.9  CL 94* 98 97*  CO2 28 28 24   GLUCOSE 111* 101* 120*  BUN 41* 19 35*  CREATININE 6.59* 4.15* 5.85*  CALCIUM 9.0 8.8* 8.8*  PHOS 5.0*  --   --    Liver Function Tests: Recent Labs  Lab 06/07/20 2020 06/08/20 1456  AST 21  --   ALT 17  --   ALKPHOS 97  --   BILITOT 1.3*  --   PROT 6.3*  --   ALBUMIN 3.3* 2.9*   No results for input(s): LIPASE, AMYLASE in the last 168 hours. CBC: Recent Labs  Lab 06/07/20 2020 06/07/20 2046 06/08/20 0525 06/09/20 0410 06/10/20 0243  WBC 14.7*  --  14.0* 11.4* 10.9*  NEUTROABS 13.5*  --   --   --   --   HGB 11.7*   < > 11.0* 11.2* 10.0*  HCT 38.8   < > 36.9 35.8* 32.2*  MCV 98.5  --  99.5 99.4 97.6  PLT 230  --  220 201 203   < > = values in this interval not displayed.   Blood Culture No results found for: SDES, SPECREQUEST, CULT, REPTSTATUS  Cardiac Enzymes: No results for input(s): CKTOTAL, CKMB, CKMBINDEX, TROPONINI in the last 168 hours. CBG: Recent Labs  Lab 06/08/20 2019  GLUCAP 101*   Iron Studies: No results for input(s): IRON, TIBC, TRANSFERRIN, FERRITIN in the last 72 hours. @lablastinr3 @ Studies/Results: ECHOCARDIOGRAM COMPLETE  Result Date: 06/08/2020    ECHOCARDIOGRAM REPORT   Patient Name:   ANDA SOBOTTA Date of Exam: 06/08/2020 Medical Rec #:  [id number]     Height:       62.0 in Accession #:    [id number]    Weight:       173.0 lb Date of Birth:  11-22-1945      BSA:          1.797 m Patient Age:    74 years      BP:           143/66 mmHg Patient Gender: F             HR:           73 bpm. Exam Location:  Inpatient Procedure: 2D Echo, Cardiac Doppler and Color Doppler Indications:    I50.33 Acute on chronic diastolic (congestive) heart failure  History:        Patient has no prior history of Echocardiogram examinations.  Sonographer:    Jonelle Sidle Dance Referring Phys: [id number] Merlin  1. Left ventricular ejection fraction, by estimation, is 50 to 55%. The left ventricle has low normal function. The  left ventricle has no regional wall motion abnormalities. There is mild left ventricular hypertrophy. Left ventricular diastolic parameters are consistent with Grade II diastolic dysfunction (pseudonormalization). Elevated left ventricular end-diastolic pressure. The E/e' is 21.  2. Right ventricular systolic function is mildly reduced. The right ventricular size is normal. There is moderately elevated pulmonary artery systolic pressure. The estimated right ventricular systolic pressure is 35.3 mmHg.  3. Left atrial size was severely dilated.  4. Right atrial size was moderately dilated.  5. The mitral valve is abnormal. Mild to moderate mitral valve regurgitation.  6. The tricuspid valve is abnormal. Tricuspid valve regurgitation is moderate to severe.  7. The aortic valve is calcified. Aortic valve regurgitation is trivial. Severe aortic valve stenosis. Aortic valve area, by VTI measures 0.60 cm. Aortic valve mean gradient measures 38.0 mmHg. Aortic valve Vmax measures 4.09 m/s. Peak gradient 67 mmHg. DI is 0.21.  8. The inferior vena cava is dilated in size with >50% respiratory variability, suggesting right atrial pressure of 8 mmHg. Comparison(s): No prior Echocardiogram.  Conclusion(s)/Recommendation(s): Findings concerning for congestive heart failure and severe aortic stenosis, would recommend structural heart team evaluation. FINDINGS  Left Ventricle: Left ventricular ejection fraction, by estimation, is 50 to 55%. The left ventricle has low normal function. The left ventricle has no regional wall motion abnormalities. The left ventricular internal cavity size was normal in size. There is mild left ventricular hypertrophy. Left ventricular diastolic parameters are consistent with Grade II diastolic dysfunction (pseudonormalization). Elevated left ventricular end-diastolic pressure. The E/e' is 21. Right Ventricle: The right ventricular size is normal. No increase in right ventricular wall thickness. Right ventricular systolic function is mildly reduced. There is moderately elevated pulmonary artery systolic pressure. The tricuspid regurgitant velocity is 3.32 m/s, and with an assumed right atrial pressure of 8 mmHg, the estimated right ventricular systolic pressure is 61.4 mmHg. Left Atrium: Left atrial size was severely dilated. Right Atrium: Right atrial size was moderately dilated. Pericardium: There is no evidence of pericardial effusion. Mitral Valve: The mitral valve is abnormal. Moderately decreased mobility of the posterior mitral valve leaflet. Mild to moderate mitral annular calcification. Mild to moderate mitral valve regurgitation. Tricuspid Valve: The tricuspid valve is abnormal. Tricuspid valve regurgitation is moderate to severe. Aortic Valve: The aortic valve is calcified. Aortic valve regurgitation is trivial. Severe aortic stenosis is present. Aortic valve mean gradient measures 38.0 mmHg. Aortic valve peak gradient measures 66.9 mmHg. Aortic valve area, by VTI measures 0.60 cm. Pulmonic Valve: The pulmonic valve was grossly normal. Pulmonic valve regurgitation is trivial. Aorta: The aortic root and ascending aorta are structurally normal, with no evidence of  dilitation. Venous: The inferior vena cava is dilated in size with greater than 50% respiratory variability, suggesting right atrial pressure of 8 mmHg. IAS/Shunts: No atrial level shunt detected by color flow Doppler.  LEFT VENTRICLE PLAX 2D LVIDd:         4.60 cm  Diastology LVIDs:         3.80 cm  LV e' medial:    5.22 cm/s LV PW:         1.30 cm  LV E/e' medial:  25.9 LV IVS:        1.20 cm  LV e' lateral:   8.05 cm/s LVOT diam:     1.90 cm  LV E/e' lateral: 16.8 LV SV:         62 LV SV Index:   35 LVOT Area:     2.84 cm  RIGHT VENTRICLE            IVC RV Basal diam:  3.90 cm    IVC diam: 2.30 cm RV Mid diam:    2.50 cm RV S prime:     7.40 cm/s TAPSE (M-mode): 1.8 cm LEFT ATRIUM              Index       RIGHT ATRIUM           Index LA diam:        5.30 cm  2.95 cm/m  RA Area:     22.30 cm LA Vol (A2C):   119.0 ml 66.21 ml/m RA Volume:   70.40 ml  39.17 ml/m LA Vol (A4C):   112.0 ml 62.31 ml/m LA Biplane Vol: 117.0 ml 65.09 ml/m  AORTIC VALVE AV Area (Vmax):    0.71 cm AV Area (Vmean):   0.67 cm AV Area (VTI):     0.60 cm AV Vmax:           409.00 cm/s AV Vmean:          284.250 cm/s AV VTI:            1.030 m AV Peak Grad:      66.9 mmHg AV Mean Grad:      38.0 mmHg LVOT Vmax:         103.00 cm/s LVOT Vmean:        67.100 cm/s LVOT VTI:          0.219 m LVOT/AV VTI ratio: 0.21  AORTA Ao Root diam: 3.00 cm Ao Asc diam:  3.20 cm MITRAL VALVE                 TRICUSPID VALVE MV Area (PHT): 3.53 cm      TR Peak grad:   44.1 mmHg MV Decel Time: 215 msec      TR Vmax:        332.00 cm/s MR Peak grad:    170.0 mmHg MR Mean grad:    106.0 mmHg  SHUNTS MR Vmax:         652.00 cm/s Systemic VTI:  0.22 m MR Vmean:        483.0 cm/s  Systemic Diam: 1.90 cm MR PISA:         1.57 cm MR PISA Eff ROA: 10 mm MR PISA Radius:  0.50 cm MV E velocity: 135.00 cm/s MV A velocity: 88.80 cm/s MV E/A ratio:  1.52 Lyman Bishop MD Electronically signed by Lyman Bishop MD Signature Date/Time: 06/08/2020/1:14:13 PM    Final     DG Hip Port Unilat With Pelvis 1V Left  Result Date: 06/09/2020 CLINICAL DATA:  Status post fracture open reduction and internal fixation. EXAM: DG HIP (WITH OR WITHOUT PELVIS) 1V PORT LEFT COMPARISON:  None. FINDINGS: There is no evidence of an acute hip fracture or dislocation. A left hip replacement is noted. There is no evidence of surrounding lucency to suggest the presence of hardware loosening or infection. A mild to moderate amount of soft tissue air is seen adjacent to the proximal and medial portions of the right femoral shaft. Moderate severity lateral left hip soft tissue swelling is also seen. IMPRESSION: 1. Left hip replacement without evidence of hardware loosening or infection. Electronically Signed   By: Virgina Norfolk M.D.   On: 06/09/2020 20:49   Medications: . sodium chloride    . sodium chloride    . heparin    .  iron sucrose 100 mg (06/08/20 1741)  . methocarbamol (ROBAXIN) IV     . acetaminophen  1,000 mg Oral Q6H  . amiodarone  200 mg Oral Daily  . calcium acetate  1,334 mg Oral TID WC  . [START ON 06/11/2020] cephALEXin  250 mg Oral Daily  . latanoprost  1 drop Both Eyes QHS  . multivitamin  1 tablet Oral QHS  . saccharomyces boulardii  250 mg Oral Daily  . timolol  1 drop Both Eyes Daily  . warfarin  5 mg Oral ONCE-1600  . Warfarin - Pharmacist Dosing Inpatient   Does not apply q1600     Dialysis Orders: Center:[facility], T,Th, S 4 hrs 180NRe 400/800 77.5 kg 2.0 K/ 2.0 Ca UFP 4 AVF -Heparin 1000 units IV TIW -Mircera 150 mcg IV q 2 weeks (last dose 05/28/2020 last HGB 10.6 06/04/2020) -Venofer 100 mg IV X 10 doses (1/10 doses given) Tsat 19 05/28/2020.  Assessment/Plan: 1. L Hip fracture S/P mechanical fall-Seen by orthopedics. L hemiarthroplasty 06/09/2020. Per primary/Orthopedics.  2. Acute on chronic diastolic HF-Last PC34 to 03% 06/08/2020. Had evidence of mild volume overload by exam/CXR 12/20. Volume excess improved post HD. Monitor  closely. 3. ESRD - T,Th,S. HD off schedule today and will have short HD tomorrow to resume T,Th,S schedule . Issues with cramping. Lowered machine temp-change to UFP 2. 4. Hypertension- BP is volume controlled. Better control today. No home BP meds. . 5. Anemia - HGB 10.9. ESA due 06/11/2020.Will order for 12/22. Continue OP Fe load. 6. Metabolic bone disease - C Ca OK. Continue binders. 7. Nutrition - Albumin OK. NPO at present Renal diet, renal vitamins, nepro when eating.  8. PAF-on coumadin. Management per primary/pharmacy.  9. Prolonged QTc in setting of RBBB-no benadryl or agents that could increase QTc  Shaina Gullatt H. Dilan Fullenwider NP-C 06/10/2020, 10:18 AM  [employer] [phone]

## 2020-06-11 ENCOUNTER — Encounter (HOSPITAL_COMMUNITY): Payer: Self-pay | Admitting: Orthopedic Surgery

## 2020-06-11 DIAGNOSIS — S72002A Fracture of unspecified part of neck of left femur, initial encounter for closed fracture: Secondary | ICD-10-CM | POA: Diagnosis not present

## 2020-06-11 DIAGNOSIS — Z7901 Long term (current) use of anticoagulants: Secondary | ICD-10-CM | POA: Diagnosis not present

## 2020-06-11 DIAGNOSIS — J9601 Acute respiratory failure with hypoxia: Secondary | ICD-10-CM | POA: Diagnosis not present

## 2020-06-11 DIAGNOSIS — I480 Paroxysmal atrial fibrillation: Secondary | ICD-10-CM | POA: Diagnosis not present

## 2020-06-11 LAB — RENAL FUNCTION PANEL
Albumin: 2.4 g/dL — ABNORMAL LOW (ref 3.5–5.0)
Anion gap: 15 (ref 5–15)
BUN: 40 mg/dL — ABNORMAL HIGH (ref 8–23)
CO2: 26 mmol/L (ref 22–32)
Calcium: 9.1 mg/dL (ref 8.9–10.3)
Chloride: 90 mmol/L — ABNORMAL LOW (ref 98–111)
Creatinine, Ser: 5.64 mg/dL — ABNORMAL HIGH (ref 0.44–1.00)
GFR, Estimated: 7 mL/min — ABNORMAL LOW (ref >60)
Glucose, Bld: 113 mg/dL — ABNORMAL HIGH (ref 70–99)
Phosphorus: 3.8 mg/dL (ref 2.5–4.6)
Potassium: 3.6 mmol/L (ref 3.5–5.1)
Sodium: 131 mmol/L — ABNORMAL LOW (ref 135–145)

## 2020-06-11 LAB — BASIC METABOLIC PANEL
Anion gap: 15 (ref 5–15)
BUN: 23 mg/dL (ref 8–23)
CO2: 24 mmol/L (ref 22–32)
Calcium: 8.2 mg/dL — ABNORMAL LOW (ref 8.9–10.3)
Chloride: 95 mmol/L — ABNORMAL LOW (ref 98–111)
Creatinine, Ser: 4.05 mg/dL — ABNORMAL HIGH (ref 0.44–1.00)
GFR, Estimated: 11 mL/min — ABNORMAL LOW (ref >60)
Glucose, Bld: 95 mg/dL (ref 70–99)
Potassium: 3.7 mmol/L (ref 3.5–5.1)
Sodium: 134 mmol/L — ABNORMAL LOW (ref 135–145)

## 2020-06-11 LAB — HEPARIN LEVEL (UNFRACTIONATED)
Heparin Unfractionated: 0.17 IU/mL — ABNORMAL LOW (ref 0.30–0.70)
Heparin Unfractionated: 0.24 IU/mL — ABNORMAL LOW (ref 0.30–0.70)

## 2020-06-11 LAB — CBC
HCT: 29.6 % — ABNORMAL LOW (ref 36.0–46.0)
HCT: 29.6 % — ABNORMAL LOW (ref 36.0–46.0)
Hemoglobin: 9.7 g/dL — ABNORMAL LOW (ref 12.0–15.0)
Hemoglobin: 9.1 g/dL — ABNORMAL LOW (ref 12.0–15.0)
MCH: 30.8 pg (ref 26.0–34.0)
MCH: 29.4 pg (ref 26.0–34.0)
MCHC: 32.8 g/dL (ref 30.0–36.0)
MCHC: 30.7 g/dL (ref 30.0–36.0)
MCV: 94 fL (ref 80.0–100.0)
MCV: 95.8 fL (ref 80.0–100.0)
Platelets: 233 10*3/uL (ref 150–400)
Platelets: 232 10*3/uL (ref 150–400)
RBC: 3.15 MIL/uL — ABNORMAL LOW (ref 3.87–5.11)
RBC: 3.09 MIL/uL — ABNORMAL LOW (ref 3.87–5.11)
RDW: 16.8 % — ABNORMAL HIGH (ref 11.5–15.5)
RDW: 16.6 % — ABNORMAL HIGH (ref 11.5–15.5)
WBC: 9.7 10*3/uL (ref 4.0–10.5)
WBC: 9.2 10*3/uL (ref 4.0–10.5)
nRBC: 0 % (ref 0.0–0.2)
nRBC: 0 % (ref 0.0–0.2)

## 2020-06-11 LAB — PROTIME-INR
INR: 2 — ABNORMAL HIGH (ref 0.8–1.2)
Prothrombin Time: 22.4 seconds — ABNORMAL HIGH (ref 11.4–15.2)

## 2020-06-11 MED ORDER — LOPERAMIDE HCL 2 MG PO CAPS
2.0000 mg | ORAL_CAPSULE | Freq: Two times a day (BID) | ORAL | Status: DC | PRN
  Administered 2020-06-11: 2 mg via ORAL
  Filled 2020-06-11: qty 1

## 2020-06-11 MED ORDER — SODIUM CHLORIDE 0.9 % IV SOLN: 100.0000 mL | INTRAVENOUS | Status: DC | PRN

## 2020-06-11 MED ORDER — WARFARIN SODIUM 1 MG PO TABS
1.0000 mg | ORAL_TABLET | Freq: Once | ORAL | Status: AC
  Administered 2020-06-11: 1 mg via ORAL
  Filled 2020-06-11: qty 1

## 2020-06-11 MED ORDER — LOPERAMIDE HCL 2 MG PO CAPS
2.0000 mg | ORAL_CAPSULE | Freq: Four times a day (QID) | ORAL | Status: DC | PRN
  Administered 2020-06-11 – 2020-06-23 (×12): 2 mg via ORAL
  Filled 2020-06-11 (×13): qty 1

## 2020-06-11 MED ORDER — LIDOCAINE HCL (PF) 1 % IJ SOLN: 5.0000 mL | INTRAMUSCULAR | Status: DC | PRN

## 2020-06-11 MED ORDER — PENTAFLUOROPROP-TETRAFLUOROETH EX AERO: 1.0000 "application " | INHALATION_SPRAY | CUTANEOUS | Status: DC | PRN

## 2020-06-11 NOTE — Plan of Care (Signed)
  Problem: Education: Goal: Knowledge of General Education information will improve Description: Including pain rating scale, medication(s)/side effects and non-pharmacologic comfort measures Outcome: Progressing   Problem: Coping: Goal: Level of anxiety will decrease Outcome: Progressing   Problem: Clinical Measurements: Goal: Will remain free from infection Outcome: Not Progressing

## 2020-06-11 NOTE — Progress Notes (Addendum)
[facility] Progress Note   Subjective:Had diarrhea overnight. C Diff antigen postive toxin negative. No further loose stools today.  HD today on schedule.   Objective Vitals:   06/10/20 2108 06/10/20 2335 06/11/20 0334 06/11/20 0821  BP: (!) 129/46 (!) 122/51 (!) 134/50 (!) 123/55  Pulse: 73 72 72 71  Resp: 15 16 15 18   Temp: 98 F (36.7 C) 98.1 F (36.7 C) (!) 97.3 F (36.3 C) (!) 97.5 F (36.4 C)  TempSrc: Oral Oral Oral Oral  SpO2: 94% 95% 97% 95%  Weight:      Height:       Physical Exam General:Pleasant elderly female in NAD Heart:S1 O75/6 systolic M. Lungs:CTAB Abdomen:Active BS, ND Extremities:Trace BLE edema.Drsg L hip intact Dialysis Access:L AVF aneurysmal +T/B   Additional Objective Labs: Basic Metabolic Panel: Recent Labs  Lab 06/08/20 1456 06/09/20 0410 06/10/20 0243 06/11/20 0139  NA 137 139 138 134*  K 5.5* 4.1 4.9 3.7  CL 94* 98 97* 95*  CO2 28 28 24 24   GLUCOSE 111* 101* 120* 95  BUN 41* 19 35* 23  CREATININE 6.59* 4.15* 5.85* 4.05*  CALCIUM 9.0 8.8* 8.8* 8.2*  PHOS 5.0*  --   --   --    Liver Function Tests: Recent Labs  Lab 06/07/20 2020 06/08/20 1456  AST 21  --   ALT 17  --   ALKPHOS 97  --   BILITOT 1.3*  --   PROT 6.3*  --   ALBUMIN 3.3* 2.9*   No results for input(s): LIPASE, AMYLASE in the last 168 hours. CBC: Recent Labs  Lab 06/07/20 2020 06/07/20 2046 06/08/20 0525 06/09/20 0410 06/10/20 0243 06/11/20 0139  WBC 14.7*  --  14.0* 11.4* 10.9* 9.7  NEUTROABS 13.5*  --   --   --   --   --   HGB 11.7*   < > 11.0* 11.2* 10.0* 9.7*  HCT 38.8   < > 36.9 35.8* 32.2* 29.6*  MCV 98.5  --  99.5 99.4 97.6 94.0  PLT 230  --  220 201 203 233   < > = values in this interval not displayed.   Blood Culture    Component Value Date/Time   SDES URINE, CATHETERIZED 06/09/2020 1800   SPECREQUEST  06/09/2020 1800    NONE Performed at [facility] [address], [place], [place] [address]    CULT  MULTIPLE SPECIES PRESENT, SUGGEST RECOLLECTION (A) 06/09/2020 1800   REPTSTATUS 06/10/2020 FINAL 06/09/2020 1800    Cardiac Enzymes: No results for input(s): CKTOTAL, CKMB, CKMBINDEX, TROPONINI in the last 168 hours. CBG: Recent Labs  Lab 06/08/20 2019  GLUCAP 101*   Iron Studies: No results for input(s): IRON, TIBC, TRANSFERRIN, FERRITIN in the last 72 hours. @lablastinr3 @ Studies/Results: DG Hip Port Unilat With Pelvis 1V Left  Result Date: 06/09/2020 CLINICAL DATA:  Status post fracture open reduction and internal fixation. EXAM: DG HIP (WITH OR WITHOUT PELVIS) 1V PORT LEFT COMPARISON:  None. FINDINGS: There is no evidence of an acute hip fracture or dislocation. A left hip replacement is noted. There is no evidence of surrounding lucency to suggest the presence of hardware loosening or infection. A mild to moderate amount of soft tissue air is seen adjacent to the proximal and medial portions of the right femoral shaft. Moderate severity lateral left hip soft tissue swelling is also seen. IMPRESSION: 1. Left hip replacement without evidence of hardware loosening or infection. Electronically Signed   By:  Virgina Norfolk M.D.   On: 06/09/2020 20:49   Medications: . heparin 1,650 Units/hr (06/11/20 0956)  . iron sucrose Stopped (06/10/20 1204)  . methocarbamol (ROBAXIN) IV     . amiodarone  200 mg Oral Daily  . calcium acetate  1,334 mg Oral TID WC  . cephALEXin  250 mg Oral Daily  . latanoprost  1 drop Both Eyes QHS  . multivitamin  1 tablet Oral QHS  . saccharomyces boulardii  250 mg Oral Daily  . timolol  1 drop Both Eyes Daily  . warfarin  1 mg Oral ONCE-1600  . Warfarin - Pharmacist Dosing Inpatient   Does not apply q1600     Dialysis Orders: Center:[facility], T,Th, S 4 hrs 180NRe 400/800 77.5 kg 2.0 K/ 2.0 Ca UFP 4 AVF -Heparin 1000 units IV TIW -Mircera 150 mcg IV q 2 weeks (last dose 05/28/2020 last HGB 10.6 06/04/2020) -Venofer 100 mg IV X 10 doses (1/10 doses given)  Tsat 19 05/28/2020.  Assessment/Plan: 1. L Hip fracture S/P mechanical fall-Seen by orthopedics. L hemiarthroplasty 06/09/2020. Per primary/Orthopedics.  2. Acute on chronic diastolic HF-Last JZ79 to 15% 06/08/2020.Hadevidence of mild volume overload by exam/CXR 12/20.Volume excess improved post HD. Monitor closely. 3. ESRD - T,Th,S. HD today to resume T,Th, S schedule. Next HD 06/14/2020 on Holiday Schedule. Issues with cramping. Lowered machine temp-change to UFP 2 which has helped.  4. Hypertension- BP is volume controlled.Better control today.No home BP meds.  5. Anemia - HGB 10.9.ESA due 06/11/2020.Will order for 12/23.Continue OP Fe load. 6. Metabolic bone disease - C Ca OK. Continue binders. 7. Nutrition - Albumin OK. Renal diet, renal vitamins, nepro when eating.  8. PAF-on coumadin. Management per primary/pharmacy.  9. Prolonged QTc in setting of RBBB-no benadryl or agents that could increase QTc  Illa Enlow H. Kaylin Schellenberg NP-C 06/11/2020, 11:28 AM  [employer] [phone]

## 2020-06-11 NOTE — Progress Notes (Addendum)
Inpatient Rehabilitation Admissions Coordinator                                    Inpatient rehab consult received, Noted Therapy recommending SNF vs Home with HH. I have placed a call to [place] to ask if they would consider CIR admit for this diagnosis. Until then, other rehab venue options need to be explored for [facility] Medicare is closed 12/23 until Monday 12/27. Acute team and TOC made aware.CIR beds limited into next week.  Danne Baxter, RN, MSN Rehab Admissions Coordinator [phone] 06/11/2020 1:56 PM

## 2020-06-11 NOTE — Progress Notes (Signed)
[facility] for warfarin and heparin Indication:  Atrial fibrillation  Allergies  Allergen Reactions  . Percocet [Oxycodone-Acetaminophen] Nausea And Vomiting  . Vicodin [Hydrocodone-Acetaminophen] Nausea And Vomiting  . Chlorhexidine Rash    Pt states she gets rash similar to "sunburn"    Patient Measurements: Height: 5\' 2"  (157.5 cm) Weight:  (76.4) IBW/kg (Calculated) : 50.1  Vital Signs: Temp: 97.5 F (36.4 C) (12/23 1323) Temp Source: Oral (12/23 1323) BP: 115/58 (12/23 1323) Pulse Rate: 72 (12/23 1323)  Labs: Recent Labs    06/09/20 0410 06/09/20 0742 06/10/20 0243 06/10/20 2202 06/11/20 0139 06/11/20 0742 06/11/20 1835  HGB 11.2*  --  10.0*  --  9.7*  --   --   HCT 35.8*  --  32.2*  --  29.6*  --   --   PLT 201  --  203  --  233  --   --   LABPROT  --  15.9* 15.5*  --   --  22.4*  --   INR  --  1.3* 1.3*  --   --  2.0*  --   HEPARINUNFRC  --   --   --  0.11*  --  0.17* 0.24*  CREATININE 4.15*  --  5.85*  --  4.05*  --   --     Estimated Creatinine Clearance: 11.5 mL/min (A) (by C-G formula based on SCr of 4.05 mg/dL (H)).   Medical History: History reviewed. No pertinent past medical history.  Assessment: 74 yr old female admitted on 06/07/20 with L femoral neck fracture; pt is S/P hemiarthroplasty this evening.  Pt was taking warfarin at home PTA for atrial fibrillation (5 mg on Tues/Thurs, 2.5 mg on other days); INR on admission was 2.4, INR this AM was down to 1.3. Warfarin was held since admission for surgery, and pt was given vitamin K 5 mg IV X 1 on 12/19. Pharmacy is consulted to resume warfarin dosing for atrial fibrillation; also on heparin bridge.  Heparin level is subtherapeutic at 0.17 on 1500 units/hr - no problems with infusion or bleeding per RN. INR jumped from 1.3 yesterday to 2 this morning - likely reflective of 7 mg dose from 12/21. Hgb trending down, now 9.7, platelets are normal. On amiodarone PTA,  which is continued during this admission.   HL came back low again tonight at 0.24. We will increase again and check in AM Goal of Therapy:  INR 2-3   Heparin level 0.3-0.7 units/ml Monitor platelets by anticoagulation protocol: Yes   Plan:  Increase heparin drip to 1800 units/hr, no bolus Heparin level in AM Monitor daily heparin level, INR, CBC Monitor for signs/symptoms of bleeding  Onnie Boer, PharmD, BCIDP, AAHIVP, CPP Infectious Disease Pharmacist 06/11/2020 7:27 PM

## 2020-06-11 NOTE — Progress Notes (Signed)
Subjective: 2 Days Post-Op s/p Procedure(s): ARTHROPLASTY BIPOLAR HIP (HEMIARTHROPLASTY)  Patient states left hip is sore when she is moved on bed but otherwise pain is well controlled. No other complaints.   Objective:  PE: VITALS:   Vitals:   06/10/20 1352 06/10/20 2108 06/10/20 2335 06/11/20 0334  BP: (!) 159/62 (!) 129/46 (!) 122/51 (!) 134/50  Pulse: 79 73 72 72  Resp: 18 15 16 15   Temp: 97.7 F (36.5 C) 98 F (36.7 C) 98.1 F (36.7 C) (!) 97.3 F (36.3 C)  TempSrc: Oral Oral Oral Oral  SpO2: 95% 94% 95% 97%  Weight:      Height:       General: alert, oriented, sitting up in bed, in no acute distress GI: abdomen soft, nontender MSK: LLE - dorsiflexion and plantarflexion intact. Able to move all toes. Distal sensation intact. Foot warm and well perfused. Mild TTP to left hip. Dressing CDI. No ecchymosis.   LABS  Results for orders placed or performed during the hospital encounter of 06/07/20 (from the past 24 hour(s))  C Difficile Quick Screen (NO PCR Reflex)     Status: Abnormal   Collection Time: 06/10/20  1:38 PM   Specimen: STOOL  Result Value Ref Range   C Diff antigen POSITIVE (A) NEGATIVE   C Diff toxin NEGATIVE NEGATIVE   C Diff interpretation      Results are indeterminate. Please contact the provider listed for your campus for C diff questions in [place].  Heparin level (unfractionated)     Status: Abnormal   Collection Time: 06/10/20 10:02 PM  Result Value Ref Range   Heparin Unfractionated 0.11 (L) 0.30 - 0.70 IU/mL  CBC     Status: Abnormal   Collection Time: 06/11/20  1:39 AM  Result Value Ref Range   WBC 9.7 4.0 - 10.5 K/uL   RBC 3.15 (L) 3.87 - 5.11 MIL/uL   Hemoglobin 9.7 (L) 12.0 - 15.0 g/dL   HCT 29.6 (L) 36.0 - 46.0 %   MCV 94.0 80.0 - 100.0 fL   MCH 30.8 26.0 - 34.0 pg   MCHC 32.8 30.0 - 36.0 g/dL   RDW 16.8 (H) 11.5 - 15.5 %   Platelets 233 150 - 400 K/uL   nRBC 0.0 0.0 - 0.2 %  Basic metabolic panel     Status: Abnormal    Collection Time: 06/11/20  1:39 AM  Result Value Ref Range   Sodium 134 (L) 135 - 145 mmol/L   Potassium 3.7 3.5 - 5.1 mmol/L   Chloride 95 (L) 98 - 111 mmol/L   CO2 24 22 - 32 mmol/L   Glucose, Bld 95 70 - 99 mg/dL   BUN 23 8 - 23 mg/dL   Creatinine, Ser 4.05 (H) 0.44 - 1.00 mg/dL   Calcium 8.2 (L) 8.9 - 10.3 mg/dL   GFR, Estimated 11 (L) >60 mL/min   Anion gap 15 5 - 15    DG Hip Port Unilat With Pelvis 1V Left  Result Date: 06/09/2020 CLINICAL DATA:  Status post fracture open reduction and internal fixation. EXAM: DG HIP (WITH OR WITHOUT PELVIS) 1V PORT LEFT COMPARISON:  None. FINDINGS: There is no evidence of an acute hip fracture or dislocation. A left hip replacement is noted. There is no evidence of surrounding lucency to suggest the presence of hardware loosening or infection. A mild to moderate amount of soft tissue air is seen adjacent to the proximal and medial portions of the right  femoral shaft. Moderate severity lateral left hip soft tissue swelling is also seen. IMPRESSION: 1. Left hip replacement without evidence of hardware loosening or infection. Electronically Signed   By: Virgina Norfolk M.D.   On: 06/09/2020 20:49    Assessment/Plan: Principal Problem:   Hip fracture (HCC) Active Problems:   Leukocytosis   Acute hypoxemic respiratory failure (HCC)   Atrial fibrillation (HCC)   ESRD (end stage renal disease) ([place])  2 Days Post-Op s/p Procedure(s): ARTHROPLASTY BIPOLAR HIP (HEMIARTHROPLASTY)  Weightbearing: WBAT LLE, up with therapy as tolerated Insicional and dressing care: Reinforce dressings as needed, dressing CDI this am VTE prophylaxis: Coumadin at baseline for PAF, bridging with heparin drip Pain control: continue current regimen, patient tolerated this regimen well with TKA done this past spring Follow - up plan: 2 weeks with Dr. Mardelle Matte Dispo: pending PT eval  Contact information:   Weekdays 8-5 Merlene Pulling, PA-C [phone] After hours and  holidays please check [email or web address] for group call information for [facility]  Ventura Bruns 06/11/2020, 7:19 AM

## 2020-06-11 NOTE — Evaluation (Signed)
Physical Therapy Evaluation Patient Details Name: [name] MRN: [id number] DOB: August 09, 1945 Today's Date: 06/11/2020   History of Present Illness  Pt is 74 yo female with PMH including cardiomyopathy, CHF, PAF, aortic stenosis, L TKA 5/21, hyperlipidemia, HTN, ESRD on TTS HD, GERD and migraines.  She had a fall at home while trying to close her window blinds.  Admitted with L hip femoral neck fracture and s/p posterior hemiarthroplasty on 06/09/20.  Clinical Impression   Pt admitted with above diagnosis. Pt reports she has not OOB since Sunday and is POD #2, despite this she did well for her first time OOB.  Pt required mod A for transfers and min A with ambulation a few steps.  Pt's pain was tolerable.  She was educated on hip precautions and provided min cues during transfers.  Pt is motivated and with good rehab potential.  Pt demonstrates ability to return home if she had 24 hr assist; however, unsure if she will be able to have 24 hr assist and also has to attend HD appointments 3 x week.  Considering this she could also benefit from SNF if family unable to provided needed assistance.  Pt and family to discuss the plan for d/c among themselves. If pt returns home, she has RW but would benefit from W/c for longer distances, BSC, and possible transport to HD if possible. Pt currently with functional limitations due to the deficits listed below (see PT Problem List). Pt will benefit from skilled PT to increase their independence and safety with mobility to allow discharge to the venue listed below.       Follow Up Recommendations Other (comment) (SNF vs home with 24 hr support/HHPT/HHOT  -pending family decision)    Financial risk analyst cushion (measurements PT);Wheelchair (measurements PT);3in1 (PT);Other (comment) (if home possible transport to HD)    Recommendations for Other Services       Precautions / Restrictions Precautions Precautions: Fall;Posterior  Hip Precaution Booklet Issued: Yes (comment) Precaution Comments: Provided handout and educated on hip precautions Restrictions Weight Bearing Restrictions: Yes LLE Weight Bearing: Weight bearing as tolerated      Mobility  Bed Mobility Overal bed mobility: Needs Assistance Bed Mobility: Supine to Sit     Supine to sit: Mod assist     General bed mobility comments: Required assist for L LE , pt pulled up on therapist hand and therapist used bed pad to facilitate scoot forward    Transfers Overall transfer level: Needs assistance Equipment used: Rolling walker (2 wheeled) Transfers: Sit to/from Stand Sit to Stand: Min assist         General transfer comment: Increased time to rise with cues for hip precautions  Ambulation/Gait Ambulation/Gait assistance: Min assist Gait Distance (Feet): 3 Feet Assistive device: Rolling walker (2 wheeled) Gait Pattern/deviations: Step-to pattern;Decreased stride length;Antalgic;Decreased weight shift to left Gait velocity: decreased   General Gait Details: Cues for sequencing , RW use, and posture.  Tolerated a few steps well but fatigued easily  Science writer    Modified Rankin (Stroke Patients Only)       Balance Overall balance assessment: Needs assistance Sitting-balance support: No upper extremity supported;Feet supported;Bilateral upper extremity supported Sitting balance-Leahy Scale: Fair Sitting balance - Comments: Static sit with supervision; required use of UE during LE MMT     Standing balance-Leahy Scale: Poor Standing balance comment: Required RW and min A  Pertinent Vitals/Pain Pain Assessment: 0-10 Pain Score: 4  Pain Location: L hip/back Pain Descriptors / Indicators: Discomfort;Sore Pain Intervention(s): Limited activity within patient's tolerance;Repositioned;Monitored during session    [place] expects to be  discharged to:: Private residence Living Arrangements: Children (daughter) Available Help at Discharge: Family;Available 24 hours/day Type of Home: House Home Access: Stairs to enter Entrance Stairs-Rails: None Entrance Stairs-Number of Steps: Step up onto porch Home Layout: One level Home Equipment: Grab bars - tub/shower;Cane - single point;Walker - 4 wheels      Prior Function     Gait / Transfers Assistance Needed: Typically did not use AD; occasionally used cane in community; could ambulate in community  ADL's / Homemaking Assistance Needed: Pt could do ADLs and IADLs  Comments: Has not driven in year due to knee surgery     Hand Dominance        Extremity/Trunk Assessment   Upper Extremity Assessment Upper Extremity Assessment: Generalized weakness (ROM WFL; grossly 4-4+/5 throughout UE)    Lower Extremity Assessment Lower Extremity Assessment: LLE deficits/detail;RLE deficits/detail RLE Deficits / Details: ROM WFL; MMT 5/5 LLE Deficits / Details: ROM within hip precautions; MMT: ankle 5/5, knee 3/5, hip 1/5    Cervical / Trunk Assessment Cervical / Trunk Assessment: Normal  Communication   Communication: No difficulties  Cognition Arousal/Alertness: Awake/alert Behavior During Therapy: WFL for tasks assessed/performed Overall Cognitive Status: Within Functional Limits for tasks assessed                                        General Comments General comments (skin integrity, edema, etc.): Discussed SNF vs home with family with pt and daughter.  Discussed if home would need 24 hr supv, assist with transfers, possible w/c, assist with ADLs, and possible transport to HD.  Educated on hip precautions and that pt needed min-mod A today.  Daughter and pt want to think about options.    Exercises General Exercises - Lower Extremity Ankle Circles/Pumps: AROM;20 reps;Seated Long Arc Quad: AROM;15 reps;Seated   Assessment/Plan    PT Assessment  Patient needs continued PT services  PT Problem List Decreased strength;Decreased mobility;Decreased range of motion;Decreased knowledge of precautions;Decreased activity tolerance;Decreased balance;Decreased knowledge of use of DME;Pain       PT Treatment Interventions DME instruction;Therapeutic activities;Modalities;Gait training;Therapeutic exercise;Patient/family education;Stair training;Balance training;Functional mobility training    PT Goals (Current goals can be found in the Care Plan section)  Acute Rehab PT Goals Patient Stated Goal: Pt would like to go home at discharge but wants to discuss with daughter PT Goal Formulation: With patient/family Time For Goal Achievement: 06/24/20 Potential to Achieve Goals: Good    Frequency Min 5X/week   Barriers to discharge        Co-evaluation               AM-PAC PT "6 Clicks" Mobility  Outcome Measure Help needed turning from your back to your side while in a flat bed without using bedrails?: A Little Help needed moving from lying on your back to sitting on the side of a flat bed without using bedrails?: A Lot Help needed moving to and from a bed to a chair (including a wheelchair)?: A Little Help needed standing up from a chair using your arms (e.g., wheelchair or bedside chair)?: A Little Help needed to walk in hospital room?: A Little Help needed climbing 3-5 steps with  a railing? : A Lot 6 Click Score: 16    End of Session Equipment Utilized During Treatment: Gait belt;Other (comment) (Pt on 2 L O2 at arrival with sats 95%.  Reports she does not use O2 at home.  Placed on RA and maintained 91-92%.  Left on RA, does have continuous pulse ox.) Activity Tolerance: Patient tolerated treatment well Patient left: with call bell/phone within reach;in chair;with chair alarm set Nurse Communication: Mobility status;Precautions;Weight bearing status (ASSIST OF 1; POSTERIOR Precautions) PT Visit Diagnosis: Unsteadiness on feet  (R26.81);Muscle weakness (generalized) (M62.81);Pain Pain - Right/Left: Left Pain - part of body: Hip    Time: 4103-0131 PT Time Calculation (min) (ACUTE ONLY): 34 min   Charges:   PT Evaluation $PT Eval Moderate Complexity: 1 Mod PT Treatments $Therapeutic Activity: 8-22 mins        Abran Richard, PT Acute Rehab Services Pager [phone] [facility] [phone]    Karlton Lemon 06/11/2020, 12:09 PM

## 2020-06-11 NOTE — Progress Notes (Signed)
[place] for warfarin and heparin Indication:  Atrial fibrillation  Allergies  Allergen Reactions  . Percocet [Oxycodone-Acetaminophen] Nausea And Vomiting  . Vicodin [Hydrocodone-Acetaminophen] Nausea And Vomiting  . Chlorhexidine Rash    Pt states she gets rash similar to "sunburn"    Patient Measurements: Height: 5\' 2"  (157.5 cm) Weight:  (76.4) IBW/kg (Calculated) : 50.1  Vital Signs: Temp: 97.5 F (36.4 C) (12/23 0821) Temp Source: Oral (12/23 0821) BP: 123/55 (12/23 0821) Pulse Rate: 71 (12/23 0821)  Labs: Recent Labs    06/09/20 0410 06/09/20 0742 06/10/20 0243 06/10/20 2202 06/11/20 0139 06/11/20 0742  HGB 11.2*  --  10.0*  --  9.7*  --   HCT 35.8*  --  32.2*  --  29.6*  --   PLT 201  --  203  --  233  --   LABPROT  --  15.9* 15.5*  --   --  22.4*  INR  --  1.3* 1.3*  --   --  2.0*  HEPARINUNFRC  --   --   --  0.11*  --  0.17*  CREATININE 4.15*  --  5.85*  --  4.05*  --     Estimated Creatinine Clearance: 11.5 mL/min (A) (by C-G formula based on SCr of 4.05 mg/dL (H)).   Medical History: History reviewed. No pertinent past medical history.  Assessment: 74 yr old female admitted on 06/07/20 with L femoral neck fracture; pt is S/P hemiarthroplasty this evening.  Pt was taking warfarin at home PTA for atrial fibrillation (5 mg on Tues/Thurs, 2.5 mg on other days); INR on admission was 2.4, INR this AM was down to 1.3. Warfarin was held since admission for surgery, and pt was given vitamin K 5 mg IV X 1 on 12/19. Pharmacy is consulted to resume warfarin dosing for atrial fibrillation; also on heparin bridge.  Heparin level is subtherapeutic at 0.17 on 1500 units/hr - no problems with infusion or bleeding per RN. INR jumped from 1.3 yesterday to 2 this morning - likely reflective of 7 mg dose from 12/21. Hgb trending down, now 9.7, platelets are normal. On amiodarone PTA, which is continued during this admission.   Goal  of Therapy:  INR 2-3   Heparin level 0.3-0.7 units/ml Monitor platelets by anticoagulation protocol: Yes   Plan:  Warfarin 1 mg PO tonight Increase heparin drip to 1650 units/hr, no bolus Heparin level in 8 hours Monitor daily heparin level, INR, CBC Monitor for signs/symptoms of bleeding  Thank you for involving pharmacy in this patient's care.  Renold Genta, PharmD, BCPS Clinical Pharmacist Clinical phone for 06/11/2020 until 3p is [phone] 06/11/2020 9:36 AM  **Pharmacist phone directory can be found on [email or web address] listed under [place]**

## 2020-06-11 NOTE — Progress Notes (Signed)
PROGRESS NOTE  [name] WJX:[id number] DOB: 09-25-1945 DOA: 06/07/2020 PCP: No primary care provider on file.   LOS: 4 days   Brief Narrative / Interim history: 74 year old female, lives with daughter, uses cane while ambulating out of her house, PMH of dilated cardiomyopathy with EF of 20-25% (EF has since normalized) in the setting of sepsis in 2130 complicated by demand ischemia, cardiac catheterization 2014 showed normal coronaries, chronic diastolic CHF, PAF on Coumadin anticoagulation, moderate to severe aortic stenosis, tolerated TKR in 5/21 well, per cardiology may need consideration of AVR at some point, hyperlipidemia, essential hypertension, ESRD on TTS HD, GERD, migraine and glaucoma presented to the ED following a mechanical fall at home with head injury and left hip pain.  She reportedly was trying to close her window blinds when she turned around, lost her balance, fell hit her head on the windowsill and landed on her left hip.  In the ED, hypoxic to 88% on room air and placed on oxygen.  CT head negative for acute abnormalities.  X-rays demonstrated acute left femoral neck fracture with superior subluxation.  Orthopedics consulted and s/p repair 12/21   Subjective / 24h Interval events: Feeling weak this morning.  Has not been out.  Had one diarrheal episode last night  Assessment & Plan: Principal Problem Acute left hip femoral neck fracture -status post mechanical fall at home, orthopedic surgery consulted and she is s/p right hemiarthroplasty on 12/21 -PT evaluation pending  Active Problems Acute hypoxic respiratory failure-related to acute on chronic diastolic CHF, patient on ESRD, she required 2 L on admission, underwent hemodialysis 12/20 -Most recent 2D echo showed an EF 86-57%, grade 2 diastolic dysfunction -Still on oxygen this morning.  Will go repeat dialysis today to go back on her normal schedule  Moderate severe arctic stenosis, moderate severe mitral regurg  -watch carefully fluid status postoperatively.  Cardiology consulted  ESRD on TTS hemodialysis -Underwent dialysis on 12/20.  Nephrology consulted, appreciate input.  HD again 12/22 and 12/23  Paroxysmal A. fib -Patient on Coumadin, she was given vitamin K in preparation for surgery.  Currently she is on sinus, continue amiodarone. -will be bridged back per cardiology, continue heparin/Coumadin per pharmacy  Essential hypertension -Likely worse due to pain. Continue hydralazine prn  Anemia in the setting of chronic kidney disease/ESRD -Stable, follow hemoglobin postoperatively  Leukocytosis -Likely stress margination in the setting of fracture.  Improving.  Chest x-ray abnormality -Per chest x-ray 12/19: Question focal osseous irregularity near the left coracoid process, favored to be secondary to patient positioning. -Requested orthopedics to weigh in, if this needs any further evaluation.  Moderate-sized hiatal hernia/GERD -Does not appear to be on any medications at home.   Scheduled Meds: . amiodarone  200 mg Oral Daily  . calcium acetate  1,334 mg Oral TID WC  . cephALEXin  250 mg Oral Daily  . latanoprost  1 drop Both Eyes QHS  . multivitamin  1 tablet Oral QHS  . saccharomyces boulardii  250 mg Oral Daily  . timolol  1 drop Both Eyes Daily  . warfarin  1 mg Oral ONCE-1600  . Warfarin - Pharmacist Dosing Inpatient   Does not apply q1600   Continuous Infusions: . heparin 1,650 Units/hr (06/11/20 0956)  . iron sucrose Stopped (06/10/20 1204)  . methocarbamol (ROBAXIN) IV     PRN Meds:.acetaminophen, bisacodyl, hydrALAZINE, HYDROmorphone (DILAUDID) injection, loperamide, menthol-cetylpyridinium **OR** phenol, methocarbamol **OR** methocarbamol (ROBAXIN) IV, ondansetron **OR** ondansetron (ZOFRAN) IV, oxyCODONE, oxyCODONE, polyethylene glycol  Diet  Orders (From admission, onward)    Start     Ordered   06/09/20 2153  Diet renal with fluid restriction Fluid  restriction: 1200 mL Fluid; Room service appropriate? Yes; Fluid consistency: Thin  Diet effective now       Question Answer Comment  Fluid restriction: 1200 mL Fluid   Room service appropriate? Yes   Fluid consistency: Thin      06/09/20 2152         DVT prophylaxis: SCDs Start: 06/09/20 2153 SCDs Start: 06/07/20 2354    Code Status: Full Code  Family Communication: no family at bedside   Status is: Inpatient  Remains inpatient appropriate because:Inpatient level of care appropriate due to severity of illness  Dispo: The patient is from: Home              Anticipated d/c is to: Home              Anticipated d/c date is: 3 days              Patient currently is not medically stable to d/c.  Consultants:  None   Procedures:  None   Microbiology  None   Antimicrobials: None     Objective: Vitals:   06/10/20 2108 06/10/20 2335 06/11/20 0334 06/11/20 0821  BP: (!) 129/46 (!) 122/51 (!) 134/50 (!) 123/55  Pulse: 73 72 72 71  Resp: 15 16 15 18   Temp: 98 F (36.7 C) 98.1 F (36.7 C) (!) 97.3 F (36.3 C) (!) 97.5 F (36.4 C)  TempSrc: Oral Oral Oral Oral  SpO2: 94% 95% 97% 95%  Weight:      Height:        Intake/Output Summary (Last 24 hours) at 06/11/2020 1107 Last data filed at 06/10/2020 1800 Gross per 24 hour  Intake 48.42 ml  Output 1721 ml  Net -1672.58 ml   Filed Weights   06/08/20 1820 06/09/20 0425 06/10/20 0402  Weight: 77.2 kg 76.8 kg 76.5 kg    Examination:  Constitutional: No distress Eyes: No scleral icterus ENMT: Moist mucous membranes Neck: normal, supple Respiratory: Clear bilaterally without wheezing or crackles Cardiovascular: Regular rate and rhythm, 3/6 SEM, no edema Abdomen: Soft, NT, ND, bowel sounds positive Musculoskeletal: no clubbing / cyanosis.  Skin: No rashes seen Neurologic: Nonfocal  Data Reviewed: I have independently reviewed following labs and imaging studies   CBC: Recent Labs  Lab 06/07/20 2020  06/07/20 2046 06/08/20 0525 06/09/20 0410 06/10/20 0243 06/11/20 0139  WBC 14.7*  --  14.0* 11.4* 10.9* 9.7  NEUTROABS 13.5*  --   --   --   --   --   HGB 11.7* 12.9 11.0* 11.2* 10.0* 9.7*  HCT 38.8 38.0 36.9 35.8* 32.2* 29.6*  MCV 98.5  --  99.5 99.4 97.6 94.0  PLT 230  --  220 201 203 161   Basic Metabolic Panel: Recent Labs  Lab 06/07/20 2020 06/07/20 2046 06/08/20 1456 06/09/20 0410 06/10/20 0243 06/11/20 0139  NA 139 137 137 139 138 134*  K 4.5 4.3 5.5* 4.1 4.9 3.7  CL 95* 97* 94* 98 97* 95*  CO2 29  --  28 28 24 24   GLUCOSE 123* 116* 111* 101* 120* 95  BUN 31* 33* 41* 19 35* 23  CREATININE 5.41* 5.30* 6.59* 4.15* 5.85* 4.05*  CALCIUM 9.1  --  9.0 8.8* 8.8* 8.2*  PHOS  --   --  5.0*  --   --   --  Liver Function Tests: Recent Labs  Lab 06/07/20 2020 06/08/20 1456  AST 21  --   ALT 17  --   ALKPHOS 97  --   BILITOT 1.3*  --   PROT 6.3*  --   ALBUMIN 3.3* 2.9*   Coagulation Profile: Recent Labs  Lab 06/07/20 2020 06/08/20 0525 06/09/20 0742 06/10/20 0243 06/11/20 0742  INR 2.4* 1.6* 1.3* 1.3* 2.0*   HbA1C: No results for input(s): HGBA1C in the last 72 hours. CBG: Recent Labs  Lab 06/08/20 2019  GLUCAP 101*    Recent Results (from the past 240 hour(s))  Resp Panel by RT-PCR (Flu A&B, Covid) Nasopharyngeal Swab     Status: None   Collection Time: 06/07/20  8:43 PM   Specimen: Nasopharyngeal Swab; Nasopharyngeal(NP) swabs in vial transport medium  Result Value Ref Range Status   SARS Coronavirus 2 by RT PCR NEGATIVE NEGATIVE Final    Comment: (NOTE) SARS-CoV-2 target nucleic acids are NOT DETECTED.  The SARS-CoV-2 RNA is generally detectable in upper respiratory specimens during the acute phase of infection. The lowest concentration of SARS-CoV-2 viral copies this assay can detect is 138 copies/mL. A negative result does not preclude SARS-Cov-2 infection and should not be used as the sole basis for treatment or other patient management  decisions. A negative result may occur with  improper specimen collection/handling, submission of specimen other than nasopharyngeal swab, presence of viral mutation(s) within the areas targeted by this assay, and inadequate number of viral copies(<138 copies/mL). A negative result must be combined with clinical observations, patient history, and epidemiological information. The expected result is Negative.  Fact Sheet for Patients:  EntrepreneurPulse.com.au  Fact Sheet for Healthcare Providers:  IncredibleEmployment.be  This test is no t yet approved or cleared by the [place] FDA and  has been authorized for detection and/or diagnosis of SARS-CoV-2 by FDA under an Emergency Use Authorization (EUA). This EUA will remain  in effect (meaning this test can be used) for the duration of the COVID-19 declaration under Section 564(b)(1) of the Act, 21 U.S.C.section 360bbb-3(b)(1), unless the authorization is terminated  or revoked sooner.       Influenza A by PCR NEGATIVE NEGATIVE Final   Influenza B by PCR NEGATIVE NEGATIVE Final    Comment: (NOTE) The Xpert Xpress SARS-CoV-2/FLU/RSV plus assay is intended as an aid in the diagnosis of influenza from Nasopharyngeal swab specimens and should not be used as a sole basis for treatment. Nasal washings and aspirates are unacceptable for Xpert Xpress SARS-CoV-2/FLU/RSV testing.  Fact Sheet for Patients: EntrepreneurPulse.com.au  Fact Sheet for Healthcare Providers: IncredibleEmployment.be  This test is not yet approved or cleared by the [place] FDA and has been authorized for detection and/or diagnosis of SARS-CoV-2 by FDA under an Emergency Use Authorization (EUA). This EUA will remain in effect (meaning this test can be used) for the duration of the COVID-19 declaration under Section 564(b)(1) of the Act, 21 U.S.C. section 360bbb-3(b)(1), unless the  authorization is terminated or revoked.  Performed at [facility], [place] [address], [place], [place] [address]   Urine Culture     Status: Abnormal   Collection Time: 06/09/20  6:00 PM   Specimen: Urine, Catheterized  Result Value Ref Range Status   Specimen Description URINE, CATHETERIZED  Final   Special Requests   Final    NONE Performed at [facility], [place] [address] [place], [place] [address]    Culture MULTIPLE SPECIES PRESENT, SUGGEST RECOLLECTION (A)  Final   Report Status 06/10/2020 FINAL  Final  C Difficile Quick Screen (NO PCR Reflex)     Status: Abnormal   Collection Time: 06/10/20  1:38 PM   Specimen: STOOL  Result Value Ref Range Status   C Diff antigen POSITIVE (A) NEGATIVE Final   C Diff toxin NEGATIVE NEGATIVE Final   C Diff interpretation   Final    Results are indeterminate. Please contact the provider listed for your campus for C diff questions in [place].    Comment: Performed at [facility], [place] [address] [place], [place] [address]     Radiology Studies: No results found.  Marzetta Board, MD, PhD [facility]  Between 7 am - 7 pm I am available, please contact me via Amion or Securechat  Between 7 pm - 7 am I am not available, please contact night coverage MD/APP via Amion

## 2020-06-12 DIAGNOSIS — J9601 Acute respiratory failure with hypoxia: Secondary | ICD-10-CM | POA: Diagnosis not present

## 2020-06-12 DIAGNOSIS — S72002A Fracture of unspecified part of neck of left femur, initial encounter for closed fracture: Secondary | ICD-10-CM | POA: Diagnosis not present

## 2020-06-12 DIAGNOSIS — I480 Paroxysmal atrial fibrillation: Secondary | ICD-10-CM | POA: Diagnosis not present

## 2020-06-12 DIAGNOSIS — Z7901 Long term (current) use of anticoagulants: Secondary | ICD-10-CM | POA: Diagnosis not present

## 2020-06-12 LAB — BASIC METABOLIC PANEL
Anion gap: 11 (ref 5–15)
BUN: 11 mg/dL (ref 8–23)
CO2: 29 mmol/L (ref 22–32)
Calcium: 8.2 mg/dL — ABNORMAL LOW (ref 8.9–10.3)
Chloride: 98 mmol/L (ref 98–111)
Creatinine, Ser: 2.67 mg/dL — ABNORMAL HIGH (ref 0.44–1.00)
GFR, Estimated: 18 mL/min — ABNORMAL LOW (ref >60)
Glucose, Bld: 117 mg/dL — ABNORMAL HIGH (ref 70–99)
Potassium: 4 mmol/L (ref 3.5–5.1)
Sodium: 138 mmol/L (ref 135–145)

## 2020-06-12 LAB — CBC
HCT: 31.3 % — ABNORMAL LOW (ref 36.0–46.0)
Hemoglobin: 9.7 g/dL — ABNORMAL LOW (ref 12.0–15.0)
MCH: 29.4 pg (ref 26.0–34.0)
MCHC: 31 g/dL (ref 30.0–36.0)
MCV: 94.8 fL (ref 80.0–100.0)
Platelets: 220 10*3/uL (ref 150–400)
RBC: 3.3 MIL/uL — ABNORMAL LOW (ref 3.87–5.11)
RDW: 16.7 % — ABNORMAL HIGH (ref 11.5–15.5)
WBC: 9.3 10*3/uL (ref 4.0–10.5)
nRBC: 0 % (ref 0.0–0.2)

## 2020-06-12 LAB — HEPARIN LEVEL (UNFRACTIONATED)
Heparin Unfractionated: 0.13 IU/mL — ABNORMAL LOW (ref 0.30–0.70)
Heparin Unfractionated: 0.59 IU/mL (ref 0.30–0.70)

## 2020-06-12 LAB — PROTIME-INR
INR: 2 — ABNORMAL HIGH (ref 0.8–1.2)
Prothrombin Time: 22.2 seconds — ABNORMAL HIGH (ref 11.4–15.2)

## 2020-06-12 MED ORDER — SODIUM CHLORIDE 0.9 % IV SOLN
125.0000 mg | Freq: Once | INTRAVENOUS | Status: AC
  Filled 2020-06-12: qty 10

## 2020-06-12 MED ORDER — DARBEPOETIN ALFA 25 MCG/0.42ML IJ SOSY: 25.0000 ug | PREFILLED_SYRINGE | INTRAMUSCULAR | Status: DC

## 2020-06-12 MED ORDER — DARBEPOETIN ALFA 25 MCG/0.42ML IJ SOSY: 25.0000 ug | PREFILLED_SYRINGE | Freq: Once | INTRAMUSCULAR | Status: AC

## 2020-06-12 MED ORDER — SODIUM CHLORIDE 0.9 % IV SOLN: 100.0000 mg | INTRAVENOUS | Status: DC

## 2020-06-12 MED ORDER — SODIUM CHLORIDE 0.9 % IV SOLN
125.0000 mg | INTRAVENOUS | Status: DC
  Administered 2020-06-14 – 2020-06-16 (×2): 125 mg via INTRAVENOUS
  Filled 2020-06-12 (×4): qty 10

## 2020-06-12 MED ORDER — WARFARIN SODIUM 2.5 MG PO TABS
2.5000 mg | ORAL_TABLET | Freq: Once | ORAL | Status: AC
  Administered 2020-06-12: 2.5 mg via ORAL
  Filled 2020-06-12: qty 1

## 2020-06-12 NOTE — Progress Notes (Signed)
PROGRESS NOTE  [name] QIO:[id number] DOB: 1945-12-09 DOA: 06/07/2020 PCP: No primary care provider on file.   LOS: 5 days   Brief Narrative / Interim history: 74 year old female, lives with daughter, uses cane while ambulating out of her house, PMH of dilated cardiomyopathy with EF of 20-25% (EF has since normalized) in the setting of sepsis in 3244 complicated by demand ischemia, cardiac catheterization 2014 showed normal coronaries, chronic diastolic CHF, PAF on Coumadin anticoagulation, moderate to severe aortic stenosis, tolerated TKR in 5/21 well, per cardiology may need consideration of AVR at some point, hyperlipidemia, essential hypertension, ESRD on TTS HD, GERD, migraine and glaucoma presented to the ED following a mechanical fall at home with head injury and left hip pain.  She reportedly was trying to close her window blinds when she turned around, lost her balance, fell hit her head on the windowsill and landed on her left hip.  In the ED, hypoxic to 88% on room air and placed on oxygen.  CT head negative for acute abnormalities.  X-rays demonstrated acute left femoral neck fracture with superior subluxation.  Orthopedics consulted and s/p repair 12/21   Subjective / 24h Interval events: Feeling better this morning, was up most part of the night that she got dialysis earlier this morning  Assessment & Plan: Principal Problem Acute left hip femoral neck fracture -status post mechanical fall at home, orthopedic surgery consulted and she is s/p right hemiarthroplasty on 12/21 -Consulted CIR for placement, discussed with [facility], will need insurance authorization on Monday.  Patient in agreement for CIR  Active Problems Acute hypoxic respiratory failure-related to acute on chronic diastolic CHF, patient on ESRD, she required 2 L on admission, underwent hemodialysis 12/20 -Most recent 2D echo showed an EF 01-02%, grade 2 diastolic dysfunction -On room air today  Moderate severe  arctic stenosis, moderate severe mitral regurg -watch carefully fluid status postoperatively.  Cardiology consulted and evaluated patient.  ESRD on TTS hemodialysis -Underwent dialysis on 12/20.  Nephrology consulted, appreciate input.  Getting dialysis here  Paroxysmal A. fib -Patient on Coumadin, she was given vitamin K in preparation for surgery.  Currently she is on sinus, continue amiodarone. -will be bridged back per cardiology, continue heparin/Coumadin per pharmacy, if INR greater than 2 tomorrow DC heparin  Essential hypertension -Blood pressure now normalized  Anemia in the setting of chronic kidney disease/ESRD -Stable, follow hemoglobin postoperatively, 9.7 this morning  Leukocytosis -Likely stress margination in the setting of fracture.  Resolved, WBC normal  Chest x-ray abnormality -Per chest x-ray 12/19: Question focal osseous irregularity near the left coracoid process, favored to be secondary to patient positioning. -Requested orthopedics to weigh in, if this needs any further evaluation.  Moderate-sized hiatal hernia/GERD -Does not appear to be on any medications at home.   Scheduled Meds: . amiodarone  200 mg Oral Daily  . calcium acetate  1,334 mg Oral TID WC  . cephALEXin  250 mg Oral Daily  . latanoprost  1 drop Both Eyes QHS  . multivitamin  1 tablet Oral QHS  . saccharomyces boulardii  250 mg Oral Daily  . timolol  1 drop Both Eyes Daily  . warfarin  2.5 mg Oral ONCE-1600  . Warfarin - Pharmacist Dosing Inpatient   Does not apply q1600   Continuous Infusions: . heparin 1,800 Units/hr (06/12/20 0417)  . iron sucrose 100 mg (06/12/20 0956)  . methocarbamol (ROBAXIN) IV     PRN Meds:.acetaminophen, bisacodyl, hydrALAZINE, HYDROmorphone (DILAUDID) injection, loperamide, menthol-cetylpyridinium **OR** phenol, methocarbamol **  OR** methocarbamol (ROBAXIN) IV, ondansetron **OR** ondansetron (ZOFRAN) IV, oxyCODONE, oxyCODONE, polyethylene glycol  Diet  Orders (From admission, onward)    Start     Ordered   06/09/20 2153  Diet renal with fluid restriction Fluid restriction: 1200 mL Fluid; Room service appropriate? Yes; Fluid consistency: Thin  Diet effective now       Question Answer Comment  Fluid restriction: 1200 mL Fluid   Room service appropriate? Yes   Fluid consistency: Thin      06/09/20 2152         DVT prophylaxis: SCDs Start: 06/09/20 2153 SCDs Start: Jul 07, 2020 2354    Code Status: Full Code  Family Communication: no family at bedside   Status is: Inpatient  Remains inpatient appropriate because:Inpatient level of care appropriate due to severity of illness  Dispo: The patient is from: Home              Anticipated d/c is to: Home              Anticipated d/c date is: 3 days              Patient currently is not medically stable to d/c.  Consultants:  None   Procedures:  None   Microbiology  None   Antimicrobials: None     Objective: Vitals:   06/12/20 0312 06/12/20 0500 06/12/20 0527 06/12/20 0929  BP: (!) 106/43  (!) 100/44 (!) 131/50  Pulse:   75 74  Resp: 18  16 18   Temp: 98.4 F (36.9 C)  99.5 F (37.5 C) (!) 97.5 F (36.4 C)  TempSrc: Oral  Oral Oral  SpO2:   90% 95%  Weight:  82.2 kg    Height:        Intake/Output Summary (Last 24 hours) at 06/12/2020 1008 Last data filed at 06/12/2020 0500 Gross per 24 hour  Intake 720 ml  Output 1525 ml  Net -805 ml   Filed Weights   06/10/20 0402 06/11/20 2220 06/12/20 0500  Weight: 76.5 kg 82.1 kg 82.2 kg    Examination:  Constitutional: NAD Eyes: No icterus ENMT: mmm Neck: normal, supple Respiratory: Clear bilaterally, no wheezing, no crackles Cardiovascular: Regular rate and rhythm, 3/6 SEM, no edema Abdomen: Soft, NT, ND, bowel sounds positive Musculoskeletal: no clubbing / cyanosis.  Skin: No rashes seen Neurologic: No focal deficits  Data Reviewed: I have independently reviewed following labs and imaging studies    CBC: Recent Labs  Lab 07/07/2020 2020 2020-07-07 2046 06/09/20 0410 06/10/20 0243 06/11/20 0139 06/11/20 2302 06/12/20 0615  WBC 14.7*   < > 11.4* 10.9* 9.7 9.2 9.3  NEUTROABS 13.5*  --   --   --   --   --   --   HGB 11.7*   < > 11.2* 10.0* 9.7* 9.1* 9.7*  HCT 38.8   < > 35.8* 32.2* 29.6* 29.6* 31.3*  MCV 98.5   < > 99.4 97.6 94.0 95.8 94.8  PLT 230   < > 201 203 233 232 220   < > = values in this interval not displayed.   Basic Metabolic Panel: Recent Labs  Lab 06/08/20 1456 06/09/20 0410 06/10/20 0243 06/11/20 0139 06/11/20 2302 06/12/20 0615  NA 137 139 138 134* 131* 138  K 5.5* 4.1 4.9 3.7 3.6 4.0  CL 94* 98 97* 95* 90* 98  CO2 28 28 24 24 26 29   GLUCOSE 111* 101* 120* 95 113* 117*  BUN 41* 19 35* 23 40* 11  CREATININE 6.59* 4.15* 5.85* 4.05* 5.64* 2.67*  CALCIUM 9.0 8.8* 8.8* 8.2* 9.1 8.2*  PHOS 5.0*  --   --   --  3.8  --    Liver Function Tests: Recent Labs  Lab 06/07/20 2020 06/08/20 1456 06/11/20 2302  AST 21  --   --   ALT 17  --   --   ALKPHOS 97  --   --   BILITOT 1.3*  --   --   PROT 6.3*  --   --   ALBUMIN 3.3* 2.9* 2.4*   Coagulation Profile: Recent Labs  Lab 06/08/20 0525 06/09/20 0742 06/10/20 0243 06/11/20 0742 06/12/20 0615  INR 1.6* 1.3* 1.3* 2.0* 2.0*   HbA1C: No results for input(s): HGBA1C in the last 72 hours. CBG: Recent Labs  Lab 06/08/20 2019  GLUCAP 101*    Recent Results (from the past 240 hour(s))  Resp Panel by RT-PCR (Flu A&B, Covid) Nasopharyngeal Swab     Status: None   Collection Time: 06/07/20  8:43 PM   Specimen: Nasopharyngeal Swab; Nasopharyngeal(NP) swabs in vial transport medium  Result Value Ref Range Status   SARS Coronavirus 2 by RT PCR NEGATIVE NEGATIVE Final    Comment: (NOTE) SARS-CoV-2 target nucleic acids are NOT DETECTED.  The SARS-CoV-2 RNA is generally detectable in upper respiratory specimens during the acute phase of infection. The lowest concentration of SARS-CoV-2 viral copies this  assay can detect is 138 copies/mL. A negative result does not preclude SARS-Cov-2 infection and should not be used as the sole basis for treatment or other patient management decisions. A negative result may occur with  improper specimen collection/handling, submission of specimen other than nasopharyngeal swab, presence of viral mutation(s) within the areas targeted by this assay, and inadequate number of viral copies(<138 copies/mL). A negative result must be combined with clinical observations, patient history, and epidemiological information. The expected result is Negative.  Fact Sheet for Patients:  EntrepreneurPulse.com.au  Fact Sheet for Healthcare Providers:  IncredibleEmployment.be  This test is no t yet approved or cleared by the [place] FDA and  has been authorized for detection and/or diagnosis of SARS-CoV-2 by FDA under an Emergency Use Authorization (EUA). This EUA will remain  in effect (meaning this test can be used) for the duration of the COVID-19 declaration under Section 564(b)(1) of the Act, 21 U.S.C.section 360bbb-3(b)(1), unless the authorization is terminated  or revoked sooner.       Influenza A by PCR NEGATIVE NEGATIVE Final   Influenza B by PCR NEGATIVE NEGATIVE Final    Comment: (NOTE) The Xpert Xpress SARS-CoV-2/FLU/RSV plus assay is intended as an aid in the diagnosis of influenza from Nasopharyngeal swab specimens and should not be used as a sole basis for treatment. Nasal washings and aspirates are unacceptable for Xpert Xpress SARS-CoV-2/FLU/RSV testing.  Fact Sheet for Patients: EntrepreneurPulse.com.au  Fact Sheet for Healthcare Providers: IncredibleEmployment.be  This test is not yet approved or cleared by the [place] FDA and has been authorized for detection and/or diagnosis of SARS-CoV-2 by FDA under an Emergency Use Authorization (EUA). This EUA will  remain in effect (meaning this test can be used) for the duration of the COVID-19 declaration under Section 564(b)(1) of the Act, 21 U.S.C. section 360bbb-3(b)(1), unless the authorization is terminated or revoked.  Performed at [facility], [place] [address] [place], [place] [address]   Urine Culture     Status: Abnormal   Collection Time: 06/09/20  6:00 PM  Specimen: Urine, Catheterized  Result Value Ref Range Status   Specimen Description URINE, CATHETERIZED  Final   Special Requests   Final    NONE Performed at [facility], [address] [place], [place] [address]    Culture MULTIPLE SPECIES PRESENT, SUGGEST RECOLLECTION (A)  Final   Report Status 06/10/2020 FINAL  Final  C Difficile Quick Screen (NO PCR Reflex)     Status: Abnormal   Collection Time: 06/10/20  1:38 PM   Specimen: STOOL  Result Value Ref Range Status   C Diff antigen POSITIVE (A) NEGATIVE Final   C Diff toxin NEGATIVE NEGATIVE Final   C Diff interpretation   Final    Results are indeterminate. Please contact the provider listed for your campus for C diff questions in [place].    Comment: Performed at [facility], [place] [address], [place], [place] [address]     Radiology Studies: No results found.  Marzetta Board, MD, PhD [facility]  Between 7 am - 7 pm I am available, please contact me via Amion or Securechat  Between 7 pm - 7 am I am not available, please contact night coverage MD/APP via Amion

## 2020-06-12 NOTE — Plan of Care (Signed)
  Problem: Education: Goal: Knowledge of General Education information will improve Description: Including pain rating scale, medication(s)/side effects and non-pharmacologic comfort measures Outcome: Progressing   Problem: Activity: Goal: Risk for activity intolerance will decrease Outcome: Progressing   Problem: Pain Managment: Goal: General experience of comfort will improve Outcome: Progressing   

## 2020-06-12 NOTE — Plan of Care (Signed)
Patient is s/p left hip hemiarthroplasty on 12/21, foam dsg to left hip clean dry and intact with ice pack placed for comfort. Dialysis patient - went down for dialysis tonight. On heparin drip infusing at 18 ml/h bridging back to Coumadin. Discharge plan is either home with HH, CIR, or SNF. Will continue to monitor and continue current POC.

## 2020-06-12 NOTE — NC FL2 (Signed)
[place] LEVEL OF CARE SCREENING TOOL     IDENTIFICATION  Patient Name: [name] Birthdate: 06/17/1946 Sex: female Admission Date (Current Location): 06/07/2020  [facility] and [place] Number:  Herbalist and Address:  [facility] [place], [place] [address], [place], [place] [address]      Provider Number: [id number]  Attending Physician Name and Address:  Caren Griffins, MD  Relative Name and Phone Number:  Anaeli Cornwall, [phone]    Current Level of Care: Hospital Recommended Level of Care: [place] Prior Approval Number:    Date Approved/Denied:   PASRR Number: [id number]  Discharge Plan: SNF    Current Diagnoses: Patient Active Problem List   Diagnosis Date Noted  . Hip fracture ([place]) 06/07/2020  . Leukocytosis 06/07/2020  . Acute hypoxemic respiratory failure ([place]) 06/07/2020  . Atrial fibrillation ([place]) 06/07/2020  . ESRD (end stage renal disease) ([place]) 06/07/2020    Orientation RESPIRATION BLADDER Height & Weight     Self,Time,Situation,Place  Normal Incontinent Weight: 181 lb 3.5 oz (82.2 kg) Height:  5\' 2"  (157.5 cm)  BEHAVIORAL SYMPTOMS/MOOD NEUROLOGICAL BOWEL NUTRITION STATUS      Incontinent Diet (See DC Summary)  AMBULATORY STATUS COMMUNICATION OF NEEDS Skin   Extensive Assist Verbally Surgical wounds (Surgical Incision L Hip w/ silicone dressing)                       Personal Care Assistance Level of Assistance  Bathing,Feeding,Dressing Bathing Assistance: Maximum assistance Feeding assistance: Limited assistance Dressing Assistance: Maximum assistance     Functional Limitations Info  Sight,Hearing,Speech Sight Info: Adequate Hearing Info: Adequate Speech Info: Adequate    SPECIAL CARE FACTORS FREQUENCY  PT (By licensed PT),OT (By licensed OT)     PT Frequency: 5x week OT Frequency: 5x week            Contractures Contractures Info: Not present     Additional Factors Info  Code Status,Allergies Code Status Info: Full Allergies Info: Percocet (oxycodone-acetaminophen), Vicodin (hydrocodone-acetaminophen), Chlorhexidine           Current Medications (06/12/2020):  This is the current hospital active medication list Current Facility-Administered Medications  Medication Dose Route Frequency Provider Last Rate Last Admin  . acetaminophen (TYLENOL) tablet 650 mg  650 mg Oral Q6H PRN Ventura Bruns, PA-C   650 mg at 06/12/20 0414  . amiodarone (PACERONE) tablet 200 mg  200 mg Oral Daily Merlene Pulling K, PA-C   200 mg at 06/12/20 0948  . bisacodyl (DULCOLAX) suppository 10 mg  10 mg Rectal Daily PRN Merlene Pulling K, PA-C      . calcium acetate (PHOSLO) capsule 1,334 mg  1,334 mg Oral TID WC Merlene Pulling K, PA-C   1,334 mg at 06/12/20 0848  . cephALEXin (KEFLEX) capsule 250 mg  250 mg Oral Daily Caren Griffins, MD   250 mg at 06/11/20 7672  . [START ON 06/14/2020] Darbepoetin Alfa (ARANESP) injection 25 mcg  25 mcg Intravenous Once Valentina Gu, NP      . Derrill Memo ON 06/14/2020] ferric gluconate (NULECIT) 125 mg in sodium chloride 0.9 % 100 mL IVPB  125 mg Intravenous Q T,Th,Sa-HD Valentina Gu, NP      . Derrill Memo ON 06/14/2020] ferric gluconate (NULECIT) 125 mg in sodium chloride 0.9 % 100 mL IVPB  125 mg Intravenous Once Valentina Gu, NP      . heparin ADULT infusion 100  units/mL (25000 units/287mL)  1,800 Units/hr Intravenous Continuous Onnie Boer Q, RPH-CPP 18 mL/hr at 06/12/20 0417 1,800 Units/hr at 06/12/20 0417  . hydrALAZINE (APRESOLINE) injection 5 mg  5 mg Intravenous Q4H PRN Merlene Pulling K, PA-C      . HYDROmorphone (DILAUDID) injection 0.5-1 mg  0.5-1 mg Intravenous Q4H PRN Merlene Pulling K, PA-C   1 mg at 06/12/20 1005  . latanoprost (XALATAN) 0.005 % ophthalmic solution 1 drop  1 drop Both Eyes QHS Merlene Pulling K, PA-C   1 drop at 06/11/20 2142  . loperamide (IMODIUM) capsule 2 mg  2 mg Oral QID PRN  Caren Griffins, MD   2 mg at 06/12/20 0654  . menthol-cetylpyridinium (CEPACOL) lozenge 3 mg  1 lozenge Oral PRN Merlene Pulling K, PA-C       Or  . phenol (CHLORASEPTIC) mouth spray 1 spray  1 spray Mouth/Throat PRN Merlene Pulling K, PA-C      . methocarbamol (ROBAXIN) tablet 500 mg  500 mg Oral Q6H PRN Merlene Pulling K, PA-C       Or  . methocarbamol (ROBAXIN) 500 mg in dextrose 5 % 50 mL IVPB  500 mg Intravenous Q6H PRN Merlene Pulling K, PA-C      . multivitamin (RENA-VIT) tablet 1 tablet  1 tablet Oral QHS Merlene Pulling K, PA-C   1 tablet at 06/11/20 2144  . ondansetron (ZOFRAN) tablet 4 mg  4 mg Oral Q6H PRN Merlene Pulling K, PA-C       Or  . ondansetron [facility]) injection 4 mg  4 mg Intravenous Q6H PRN Merlene Pulling K, PA-C      . oxyCODONE (Oxy IR/ROXICODONE) immediate release tablet 10-15 mg  10-15 mg Oral Q4H PRN Merlene Pulling K, PA-C      . oxyCODONE (Oxy IR/ROXICODONE) immediate release tablet 5-10 mg  5-10 mg Oral Q4H PRN Merlene Pulling K, PA-C      . polyethylene glycol (MIRALAX / GLYCOLAX) packet 17 g  17 g Oral Daily PRN Merlene Pulling K, PA-C      . saccharomyces boulardii (FLORASTOR) capsule 250 mg  250 mg Oral Daily Merlene Pulling K, PA-C   250 mg at 06/12/20 0948  . timolol (TIMOPTIC) 0.5 % ophthalmic solution 1 drop  1 drop Both Eyes Daily Merlene Pulling K, PA-C   1 drop at 06/11/20 0853  . warfarin (COUMADIN) tablet 2.5 mg  2.5 mg Oral ONCE-1600 Alvira Philips, [place]      . Warfarin - Pharmacist Dosing Inpatient   Does not apply [id number] Caren Griffins, MD   Given at 06/09/20 2252     Discharge Medications: Please see discharge summary for a list of discharge medications.  Relevant Imaging Results:  Relevant Lab Results:   Additional Information SS # [id number] [place]

## 2020-06-12 NOTE — Progress Notes (Signed)
Subjective: 3 Days Post-Op s/p Procedure(s): ARTHROPLASTY BIPOLAR HIP (HEMIARTHROPLASTY)  Patient states left hip is sore when she is moved on bed but otherwise pain is well controlled. No other complaints.   Objective:  PE: VITALS:   Vitals:   06/12/20 0312 06/12/20 0500 06/12/20 0527 06/12/20 0929  BP: (!) 106/43  (!) 100/44 (!) 131/50  Pulse:   75 74  Resp: 18  16 18   Temp: 98.4 F (36.9 C)  99.5 F (37.5 C) (!) 97.5 F (36.4 C)  TempSrc: Oral  Oral Oral  SpO2:   90% 95%  Weight:  82.2 kg    Height:       General: alert, oriented, sitting up in bed, in no acute distress GI: abdomen soft, nontender MSK: LLE - dorsiflexion and plantarflexion intact. Able to move all toes. Distal sensation intact. Foot warm and well perfused. Mild TTP to left hip. Dressing CDI. No ecchymosis.   LABS  Results for orders placed or performed during the hospital encounter of 06/07/20 (from the past 24 hour(s))  Heparin level (unfractionated)     Status: Abnormal   Collection Time: 06/11/20  6:35 PM  Result Value Ref Range   Heparin Unfractionated 0.24 (L) 0.30 - 0.70 IU/mL  Renal function panel     Status: Abnormal   Collection Time: 06/11/20 11:02 PM  Result Value Ref Range   Sodium 131 (L) 135 - 145 mmol/L   Potassium 3.6 3.5 - 5.1 mmol/L   Chloride 90 (L) 98 - 111 mmol/L   CO2 26 22 - 32 mmol/L   Glucose, Bld 113 (H) 70 - 99 mg/dL   BUN 40 (H) 8 - 23 mg/dL   Creatinine, Ser 5.64 (H) 0.44 - 1.00 mg/dL   Calcium 9.1 8.9 - 10.3 mg/dL   Phosphorus 3.8 2.5 - 4.6 mg/dL   Albumin 2.4 (L) 3.5 - 5.0 g/dL   GFR, Estimated 7 (L) >60 mL/min   Anion gap 15 5 - 15  CBC     Status: Abnormal   Collection Time: 06/11/20 11:02 PM  Result Value Ref Range   WBC 9.2 4.0 - 10.5 K/uL   RBC 3.09 (L) 3.87 - 5.11 MIL/uL   Hemoglobin 9.1 (L) 12.0 - 15.0 g/dL   HCT 29.6 (L) 36.0 - 46.0 %   MCV 95.8 80.0 - 100.0 fL   MCH 29.4 26.0 - 34.0 pg   MCHC 30.7 30.0 - 36.0 g/dL   RDW 16.6 (H) 11.5 - 15.5  %   Platelets 232 150 - 400 K/uL   nRBC 0.0 0.0 - 0.2 %  CBC     Status: Abnormal   Collection Time: 06/12/20  6:15 AM  Result Value Ref Range   WBC 9.3 4.0 - 10.5 K/uL   RBC 3.30 (L) 3.87 - 5.11 MIL/uL   Hemoglobin 9.7 (L) 12.0 - 15.0 g/dL   HCT 31.3 (L) 36.0 - 46.0 %   MCV 94.8 80.0 - 100.0 fL   MCH 29.4 26.0 - 34.0 pg   MCHC 31.0 30.0 - 36.0 g/dL   RDW 16.7 (H) 11.5 - 15.5 %   Platelets 220 150 - 400 K/uL   nRBC 0.0 0.0 - 0.2 %  Heparin level (unfractionated)     Status: Abnormal   Collection Time: 06/12/20  6:15 AM  Result Value Ref Range   Heparin Unfractionated 0.13 (L) 0.30 - 0.70 IU/mL  Protime-INR     Status: Abnormal   Collection Time: 06/12/20  6:15  AM  Result Value Ref Range   Prothrombin Time 22.2 (H) 11.4 - 15.2 seconds   INR 2.0 (H) 0.8 - 1.2  Basic metabolic panel     Status: Abnormal   Collection Time: 06/12/20  6:15 AM  Result Value Ref Range   Sodium 138 135 - 145 mmol/L   Potassium 4.0 3.5 - 5.1 mmol/L   Chloride 98 98 - 111 mmol/L   CO2 29 22 - 32 mmol/L   Glucose, Bld 117 (H) 70 - 99 mg/dL   BUN 11 8 - 23 mg/dL   Creatinine, Ser 2.67 (H) 0.44 - 1.00 mg/dL   Calcium 8.2 (L) 8.9 - 10.3 mg/dL   GFR, Estimated 18 (L) >60 mL/min   Anion gap 11 5 - 15    No results found.  Assessment/Plan: Principal Problem:   Hip fracture (HCC) Active Problems:   Leukocytosis   Acute hypoxemic respiratory failure (HCC)   Atrial fibrillation (HCC)   ESRD (end stage renal disease) (HCC)  3 Days Post-Op s/p Procedure(s): ARTHROPLASTY BIPOLAR HIP (HEMIARTHROPLASTY)  Weightbearing: WBAT LLE, up with therapy as tolerated Insicional and dressing care: Reinforce dressings as needed, dressing CDI this am, will change dressing tomorrow AM.  VTE prophylaxis: Coumadin at baseline for PAF, bridging with heparin drip Pain control: continue current regimen, patient tolerated this regimen well with TKA done this past spring Follow - up plan: 2 weeks with Dr. Mardelle Matte Dispo:  To SNF. Awaiting authorization. Probably Monday. Other care per primary team.   Contact information:   After hours and holidays please check [email or web address] for group call information for [facility] [place] 06/12/2020, 1:31 PM

## 2020-06-12 NOTE — Progress Notes (Signed)
Physical Therapy Treatment Patient Details Name: [name] MRN: [id number] DOB: January 12, 1946 Today's Date: 06/12/2020    History of Present Illness Pt is 74 yo female with PMH including cardiomyopathy, CHF, PAF, aortic stenosis, L TKA 5/21, hyperlipidemia, HTN, ESRD on TTS HD, GERD and migraines. Admitted with L hip femoral neck fracture and s/p posterior hemiarthroplasty on 06/09/20. Typically receives HD T, TH, S.    PT Comments    Patient making good progress with acute PT and advanced gait distance to ~63' with RW. She required cues for step sequencing and min assist for safe use of RW. Pt limited by UE fatigue and Lt hip pain during gait taking 2x standing rest breaks. Pt able to transfer sit<>stand from [facility] and was agreeable to sit in recliner at [facility] to visit with her daughter. Pt was able to name 2/3 posterior hip precautions and reviewed restrictions/handout again. She will continue to benefit from skilled PT interventions to progress independence with mobility. Recommend SNF follow up at this time, pt may progress to be able to discharge home with 24/7 assist from her daughter pending further progress with acute therapies.    Follow Up Recommendations  SNF     Equipment Recommendations  Wheelchair cushion (measurements PT);Wheelchair (measurements PT);3in1 (PT);Other (comment);Rolling walker with 5" wheels    Recommendations for Other Services       Precautions / Restrictions Precautions Precautions: Fall;Posterior Hip Precaution Booklet Issued: Yes (comment) Precaution Comments: Provided handout and educated on hip precautions Restrictions Weight Bearing Restrictions: No LLE Weight Bearing: Weight bearing as tolerated    Mobility  Bed Mobility Overal bed mobility: Needs Assistance Bed Mobility: Supine to Sit     Supine to sit: Min assist;HOB elevated     General bed mobility comments: cues to maintain posterior hip precautions and assist for Lt LE to move to EOB.  pt able to scoot anteriorly to get feet on floor.  Transfers Overall transfer level: Needs assistance Equipment used: Rolling walker (2 wheeled) Transfers: Sit to/from Stand Sit to Stand: Min assist         General transfer comment: cues for technique with RW and Lt LE positioning to prevent hip flexion past 90 deg. She required min assist for power up from EOB, BSC. Pt reaching back to control lowering to [facility] and recliner.  Ambulation/Gait Ambulation/Gait assistance: Min assist Gait Distance (Feet): 80 Feet Assistive device: Rolling walker (2 wheeled) Gait Pattern/deviations: Step-to pattern;Decreased stride length;Antalgic;Decreased weight shift to left Gait velocity: decr   General Gait Details: cues for sequencing step pattern and proximity to RW. No overt LOB throughout. no c/o dizziness. pt took 2 standing rest breaks due to UE fatigue.   Stairs             Wheelchair Mobility    Modified Rankin (Stroke Patients Only)       Balance Overall balance assessment: Needs assistance Sitting-balance support: No upper extremity supported;Feet supported;Bilateral upper extremity supported Sitting balance-Leahy Scale: Fair Sitting balance - Comments: At times propping with UEs - more due to fatigue or offloading than balance.   Standing balance support: During functional activity;Bilateral upper extremity supported Standing balance-Leahy Scale: Poor Standing balance comment: reliant on external support and RW.                            Cognition Arousal/Alertness: Awake/alert Behavior During Therapy: WFL for tasks assessed/performed Overall Cognitive Status: Within Functional Limits for tasks assessed  Exercises      General Comments        Pertinent Vitals/Pain Pain Assessment: 0-10 Pain Score: 4  Pain Location: Lt hip Pain Descriptors / Indicators: Discomfort;Sore Pain Intervention(s):  Limited activity within patient's tolerance;Monitored during session;Repositioned;Ice applied    Home Living Family/patient expects to be discharged to:: Private residence Living Arrangements: Children (daughter) Available Help at Discharge: Family;Available 24 hours/day Type of Home: House Home Access: Stairs to enter Entrance Stairs-Rails: None Home Layout: One level Home Equipment: Grab bars - tub/shower;Cane - single point;Walker - 4 wheels      Prior Function    Gait / Transfers Assistance Needed: Typically did not use AD; occasionally used cane in community; could ambulate in community ADL's / Homemaking Assistance Needed: Pt could do ADLs and IADLs Comments: Has not driven in year due to knee surgery   PT Goals (current goals can now be found in the care plan section) Acute Rehab PT Goals Patient Stated Goal: To get stronger and get back to independence PT Goal Formulation: With patient/family Time For Goal Achievement: 06/24/20 Potential to Achieve Goals: Good Progress towards PT goals: Progressing toward goals    Frequency    Min 5X/week      PT Plan Current plan remains appropriate    Co-evaluation              AM-PAC PT "6 Clicks" Mobility   Outcome Measure  Help needed turning from your back to your side while in a flat bed without using bedrails?: A Little Help needed moving from lying on your back to sitting on the side of a flat bed without using bedrails?: A Little Help needed moving to and from a bed to a chair (including a wheelchair)?: A Little Help needed standing up from a chair using your arms (e.g., wheelchair or bedside chair)?: A Little Help needed to walk in hospital room?: A Little Help needed climbing 3-5 steps with a railing? : A Lot 6 Click Score: 17    End of Session Equipment Utilized During Treatment: Gait belt Activity Tolerance: Patient tolerated treatment well Patient left: in chair;with call bell/phone within reach;with  chair alarm set;with family/visitor present Nurse Communication: Mobility status PT Visit Diagnosis: Unsteadiness on feet (R26.81);Muscle weakness (generalized) (M62.81);Pain Pain - Right/Left: Left Pain - part of body: Hip     Time: 4742-5956 PT Time Calculation (min) (ACUTE ONLY): 40 min  Charges:  $Gait Training: 23-37 mins $Therapeutic Activity: 8-22 mins                     [name], DPT Acute Rehabilitation Services Office [phone] Pager [phone]     [name] 06/12/2020, 1:45 PM

## 2020-06-12 NOTE — Progress Notes (Signed)
Inpatient Rehabilitation Admissions Coordinator  [facility] Medicare will not approve Cir admit for this diagnosis. Other rehab venue options need to be explored. We will not pursue Cir admit. Acute team and TOC team made aware. We will sign off.  Danne Baxter, RN, MSN Rehab Admissions Coordinator [phone] 06/12/2020 11:14 AM

## 2020-06-12 NOTE — Progress Notes (Signed)
ANTICOAGULATION CONSULT NOTE - Follow Up Consult  Pharmacy Consult for heparin Indication: atrial fibrillation  Labs: Recent Labs    06/10/20 0243 06/10/20 2202 06/11/20 0139 06/11/20 0742 06/11/20 1835 06/11/20 2302 06/12/20 0615  HGB 10.0*  --  9.7*  --   --  9.1* 9.7*  HCT 32.2*  --  29.6*  --   --  29.6* 31.3*  PLT 203  --  233  --   --  232 220  LABPROT 15.5*  --   --  22.4*  --   --  22.2*  INR 1.3*  --   --  2.0*  --   --  2.0*  HEPARINUNFRC  --    < >  --  0.17* 0.24*  --  0.13*  CREATININE 5.85*  --  4.05*  --   --  5.64* 2.67*   < > = values in this interval not displayed.    Assessment/Plan:  74yo female subtherapeutic on heparin with lower heparin level despite rate increase; RN reports that when pt returned from HD the UFH was not running or even hooked up, unclear how long infusion was held. Will continue gtt at current rate and check additional level.   Wynona Neat, PharmD, BCPS  06/12/2020,7:27 AM

## 2020-06-12 NOTE — TOC Initial Note (Addendum)
Transition of Care [facility]) - Initial/Assessment Note    Patient Details  Name: [name] MRN: [id number] Date of Birth: [birth year]  Transition of Care [facility]) CM/SW Contact:    Coralee Pesa, [place] Phone Number: 06/12/2020, 11:46 AM  Clinical Narrative:                 CSW received update that pt's insurance will not pay for CIR. CSW went to speak with pt about SNF placement. She is agreeable and notes that she has heard good things about [facility] and they are her first choice. Pt has been vaccinated and had the [place] booster as well. Pt will need a SNF that can transport to Dialysis as well. Pt noted that she will update her daughter and CSW will provide Medicare list. CSW will do workup and faxout, but unable to start insurance due to holiday.  CSW attempted to update social, but it would not allow. SS# is [phone]  Expected Discharge Plan: [place] Barriers to Discharge: SNF Pending bed offer,Insurance Authorization   Patient Goals and CMS Choice Patient states their goals for this hospitalization and ongoing recovery are:: Pt agreeable to SNF placement and prefers [employer]. CMS Medicare.gov Compare Post Acute Care list provided to:: Patient Choice offered to / list presented to : Patient  Expected Discharge Plan and Services Expected Discharge Plan: [place] Choice: [place] arrangements for the past 2 months: Single Family Home                                      Prior Living Arrangements/Services Living arrangements for the past 2 months: Single Family Home Lives with:: Self Patient language and need for interpreter reviewed:: Yes Do you feel safe going back to the place where you live?: Yes      Need for Family Participation in Patient Care: No (Comment) Care giver support system in place?: Yes (comment)   Criminal Activity/Legal Involvement Pertinent to Current  Situation/Hospitalization: No - Comment as needed  Activities of Daily Living Home Assistive Devices/Equipment: Cane (specify quad or straight) (sometimes when going out) ADL Screening (condition at time of admission) Patient's cognitive ability adequate to safely complete daily activities?: Yes Is the patient deaf or have difficulty hearing?: No Does the patient have difficulty seeing, even when wearing glasses/contacts?: No Does the patient have difficulty concentrating, remembering, or making decisions?: No Patient able to express need for assistance with ADLs?: Yes Does the patient have difficulty dressing or bathing?: No Independently performs ADLs?: Yes (appropriate for developmental age) Does the patient have difficulty walking or climbing stairs?: No Weakness of Legs: None Weakness of Arms/Hands: None  Permission Sought/Granted Permission sought to share information with : Family Supports Permission granted to share information with : Yes, Verbal Permission Granted  Share Information with NAME: [name]     Permission granted to share info w Relationship: Daughter  Permission granted to share info w Contact Information: [phone]  Emotional Assessment Appearance:: Appears stated age Attitude/Demeanor/Rapport: Engaged Affect (typically observed): Pleasant Orientation: : Oriented to Self,Oriented to Place,Oriented to  Time,Oriented to Situation Alcohol / Substance Use: Not Applicable Psych Involvement: No (comment)  Admission diagnosis:  Hip fracture ([place]) [S72.009A] Anticoagulated [Z79.01] ESRD (end stage renal disease) on dialysis ([place]) [N18.6, Z99.2] Closed left hip fracture, initial encounter ([place]) [S72.002A] Patient  Active Problem List   Diagnosis Date Noted  . Hip fracture ([place]) 06/07/2020  . Leukocytosis 06/07/2020  . Acute hypoxemic respiratory failure ([place]) 06/07/2020  . Atrial fibrillation ([place]) 06/07/2020  . ESRD (end stage renal disease) ([place])  06/07/2020   PCP:  No primary care provider on file. Pharmacy:   [facility], [address] [place] [address] [address] Phone: [phone] Fax: [phone]     Social Determinants of Health (SDOH) Interventions    Readmission Risk Interventions No flowsheet data found.

## 2020-06-12 NOTE — Evaluation (Signed)
Occupational Therapy Evaluation Patient Details Name: [name] MRN: [id number] DOB: 1945-11-25 Today's Date: 06/12/2020    History of Present Illness Pt is 74 yo female with PMH including cardiomyopathy, CHF, PAF, aortic stenosis, L TKA 5/21, hyperlipidemia, HTN, ESRD on TTS HD, GERD and migraines. Admitted with L hip femoral neck fracture and s/p posterior hemiarthroplasty on 06/09/20. Typically receives HD T, TH, S.   Clinical Impression   Mrs. Madalen Gavin is a 74 year old woman s/p hip fracture repair with posterior hip precautions who presents supine in bed and with reports of fatigue and tiredness due to HD during the night. On evaluation patient demonstrates generalized weakness, decreased activity tolerance, impaired balance and reports pain in left hip resulting in a significant decline in functional abilities. Patient mod assist for bed transfers, min assist to stand with RW and only able to take steps to head of the bed today due to fatigue. Patient set up for UB ADLs, max assist for LB bathing and toileting and total assist for LB dressing. Patient will benefit from skilled OT services while in hospital to improve deficits and learn compensatory strategies as needed in order to improve to modified independence. Patient reports she has talked to her daughter and wants to go to rehab at discharge instead of home stating "I know I need more help." Patient states she would like to do "therapy here" if possible. Patient is motivated to improve and is frustrated with herself today because she wasn't able to do more.     Follow Up Recommendations  SNF;CIR    Equipment Recommendations  Tub/shower seat    Recommendations for Other Services       Precautions / Restrictions Precautions Precautions: Fall;Posterior Hip Precaution Booklet Issued: Yes (comment) (by PT) Precaution Comments: Provided handout and educated on hip precautions Restrictions Weight Bearing Restrictions:  Yes LLE Weight Bearing: Weight bearing as tolerated      Mobility Bed Mobility Overal bed mobility: Needs Assistance Bed Mobility: Supine to Sit           General bed mobility comments: Required assist for L LE , pt pulled up on therapist hand and therapist used bed pad to facilitate scoot forward    Transfers Overall transfer level: Needs assistance Equipment used: Rolling walker (2 wheeled) Transfers: Sit to/from Stand Sit to Stand: Min assist         General transfer comment: Increased time to rise with cue and use RW. Able to take steps to the head of the bed. Patient limited by fatigue and tiredness and requested to go back to bed.    Balance Overall balance assessment: Needs assistance Sitting-balance support: No upper extremity supported;Feet supported;Bilateral upper extremity supported Sitting balance-Leahy Scale: Fair Sitting balance - Comments: At times propping with UEs - more due to fatigue or offloading than balance.   Standing balance support: During functional activity;Bilateral upper extremity supported Standing balance-Leahy Scale: Poor Standing balance comment: Required RW and min A                           ADL either performed or assessed with clinical judgement   ADL Overall ADL's : Needs assistance/impaired Eating/Feeding: Set up;Sitting   Grooming: Set up;Sitting   Upper Body Bathing: Set up;Sitting   Lower Body Bathing: Maximal assistance;Sit to/from stand   Upper Body Dressing : Set up;Sitting   Lower Body Dressing: Sit to/from stand;Total assistance   Toilet Transfer: Minimal assistance;BSC;Stand-pivot;RW  Toileting- Clothing Manipulation and Hygiene: Sit to/from stand;Maximal assistance       Functional mobility during ADLs: Minimal assistance;Rolling walker       Vision   Vision Assessment?: No apparent visual deficits     Perception     Praxis      Pertinent Vitals/Pain Pain Assessment: 0-10 Pain Score:  9  Pain Location: L hip Pain Descriptors / Indicators: Grimacing;Crying;Guarding Pain Intervention(s): Limited activity within patient's tolerance;Monitored during session;Patient requesting pain meds-RN notified;Repositioned;Ice applied     Hand Dominance Right   Extremity/Trunk Assessment Upper Extremity Assessment Upper Extremity Assessment: Overall WFL for tasks assessed   Lower Extremity Assessment Lower Extremity Assessment: Defer to PT evaluation   Cervical / Trunk Assessment Cervical / Trunk Assessment: Normal   Communication Communication Communication: No difficulties   Cognition Arousal/Alertness: Awake/alert (initially drowsy but became alert with stimuli.') Behavior During Therapy: WFL for tasks assessed/performed Overall Cognitive Status: Within Functional Limits for tasks assessed                                     General Comments       Exercises     Shoulder Instructions      Home Living Family/patient expects to be discharged to:: Private residence Living Arrangements: Children (daughter) Available Help at Discharge: Family;Available 24 hours/day Type of Home: House Home Access: Stairs to enter [employer] of Steps: Step up onto porch Entrance Stairs-Rails: None Home Layout: One level     Bathroom Shower/Tub: Teacher, early years/pre: Handicapped height     Home Equipment: Grab bars - tub/shower;Cane - single point;Walker - 4 wheels          Prior Functioning/Environment    Gait / Transfers Assistance Needed: Typically did not use AD; occasionally used cane in community; could ambulate in community ADL's / Homemaking Assistance Needed: Pt could do ADLs and IADLs   Comments: Has not driven in year due to knee surgery        OT Problem List: Decreased strength;Decreased range of motion;Decreased activity tolerance;Impaired balance (sitting and/or standing);Decreased cognition;Decreased safety  awareness;Pain;Decreased knowledge of use of DME or AE;Decreased knowledge of precautions      OT Treatment/Interventions: Self-care/ADL training;Therapeutic exercise;DME and/or AE instruction;Therapeutic activities;Patient/family education;Balance training    OT Goals(Current goals can be found in the care plan section) Acute Rehab OT Goals Patient Stated Goal: To get stronger and get back to independence OT Goal Formulation: With patient Time For Goal Achievement: 06/26/20 Potential to Achieve Goals: Good  OT Frequency: Min 2X/week   Barriers to D/C:            Co-evaluation              AM-PAC OT "6 Clicks" Daily Activity     Outcome Measure Help from another person eating meals?: A Little Help from another person taking care of personal grooming?: A Little Help from another person toileting, which includes using toliet, bedpan, or urinal?: A Lot Help from another person bathing (including washing, rinsing, drying)?: A Lot Help from another person to put on and taking off regular upper body clothing?: A Little Help from another person to put on and taking off regular lower body clothing?: Total 6 Click Score: 14   End of Session Equipment Utilized During Treatment: Rolling walker Nurse Communication: Patient requests pain meds  Activity Tolerance: Patient limited by pain;Patient limited by fatigue  Patient left: in bed;with call bell/phone within reach;with bed alarm set  OT Visit Diagnosis: Unsteadiness on feet (R26.81);Other abnormalities of gait and mobility (R26.89);History of falling (Z91.81);Pain Pain - Right/Left: Left Pain - part of body: Hip                Time: 1561-5379 OT Time Calculation (min): 22 min Charges:  OT General Charges $OT Visit: 1 Visit OT Evaluation $OT Eval Moderate Complexity: 1 Mod  Marijayne Rauth, OTR/L [place]  Office [phone] Pager: [place] 06/12/2020, 9:55 AM

## 2020-06-12 NOTE — Progress Notes (Signed)
[place] for warfarin and heparin Indication:  Atrial fibrillation  Allergies  Allergen Reactions  . Percocet [Oxycodone-Acetaminophen] Nausea And Vomiting  . Vicodin [Hydrocodone-Acetaminophen] Nausea And Vomiting  . Chlorhexidine Rash    Pt states she gets rash similar to "sunburn"    Patient Measurements: Height: 5\' 2"  (157.5 cm) Weight: 82.2 kg (181 lb 3.5 oz) IBW/kg (Calculated) : 50.1  Vital Signs: Temp: 97.5 F (36.4 C) (12/24 0929) Temp Source: Oral (12/24 0929) BP: 131/50 (12/24 0929) Pulse Rate: 74 (12/24 0929)  Labs: Recent Labs    06/10/20 0243 06/10/20 2202 06/11/20 0139 06/11/20 0742 06/11/20 1835 06/11/20 2302 06/12/20 0615  HGB 10.0*  --  9.7*  --   --  9.1* 9.7*  HCT 32.2*  --  29.6*  --   --  29.6* 31.3*  PLT 203  --  233  --   --  232 220  LABPROT 15.5*  --   --  22.4*  --   --  22.2*  INR 1.3*  --   --  2.0*  --   --  2.0*  HEPARINUNFRC  --    < >  --  0.17* 0.24*  --  0.13*  CREATININE 5.85*  --  4.05*  --   --  5.64* 2.67*   < > = values in this interval not displayed.    Estimated Creatinine Clearance: 18.1 mL/min (A) (by C-G formula based on SCr of 2.67 mg/dL (H)).   Medical History: History reviewed. No pertinent past medical history.  Assessment: 74 yr old female admitted on 06/07/20 with L femoral neck fracture; pt is S/P hemiarthroplasty this evening.  Pt was taking warfarin at home PTA for atrial fibrillation (5 mg on Tues/Thurs, 2.5 mg on other days); INR on admission was 2.4, INR this AM was down to 1.3. Warfarin was held since admission for surgery, and pt was given vitamin K 5 mg IV X 1 on 12/19. Pharmacy is consulted to resume warfarin dosing for atrial fibrillation; also on heparin bridge.  Heparin level is therapeutic at 0.59 on 1800 units/hr - will decrease to target middle range goal. INR is 2 this morning after giving low dose warfarin due to rapid jump in INR. Hgb has trended down,  now 9s, platelets are normal. On amiodarone PTA, which is continued during this admission.   Goal of Therapy:  INR 2-3   Heparin level 0.3-0.7 units/ml Monitor platelets by anticoagulation protocol: Yes   Plan:  Warfarin 2.5 mg PO tonight Decrease heparin drip to 1750 units/hr - d/c tomorrow if INR >2 Monitor daily heparin level, INR, CBC Monitor for signs/symptoms of bleeding  Thank you for involving pharmacy in this patient's care.  Renold Genta, PharmD, BCPS Clinical Pharmacist Clinical phone for 06/12/2020 until 3p is [phone] 06/12/2020 9:52 AM  **Pharmacist phone directory can be found on [email or web address] listed under [place]**

## 2020-06-12 NOTE — Progress Notes (Signed)
[facility] Progress Note   Subjective:Seen in room. One episode of diarrhea over night, Finished HD early this AM. Confused about Holiday schedule which is understandable. No specific complaints.   Will not be able to do rehab in [facility] here D/T insurance.   Objective Vitals:   06/12/20 0312 06/12/20 0500 06/12/20 0527 06/12/20 0929  BP: (!) 106/43  (!) 100/44 (!) 131/50  Pulse:   75 74  Resp: 18  16 18   Temp: 98.4 F (36.9 C)  99.5 F (37.5 C) (!) 97.5 F (36.4 C)  TempSrc: Oral  Oral Oral  SpO2:   90% 95%  Weight:  82.2 kg    Height:       Physical Exam General:Pleasant elderly female in NAD Heart:S1 E99/3 systolic M. Lungs:CTAB Abdomen:Active BS, ND Extremities:Trace BLE edema.Drsg L hip intact Dialysis Access:L AVF aneurysmal +T/B    Additional Objective Labs: Basic Metabolic Panel: Recent Labs  Lab 06/08/20 1456 06/09/20 0410 06/11/20 0139 06/11/20 2302 06/12/20 0615  NA 137   < > 134* 131* 138  K 5.5*   < > 3.7 3.6 4.0  CL 94*   < > 95* 90* 98  CO2 28   < > 24 26 29   GLUCOSE 111*   < > 95 113* 117*  BUN 41*   < > 23 40* 11  CREATININE 6.59*   < > 4.05* 5.64* 2.67*  CALCIUM 9.0   < > 8.2* 9.1 8.2*  PHOS 5.0*  --   --  3.8  --    < > = values in this interval not displayed.   Liver Function Tests: Recent Labs  Lab 06/07/20 2020 06/08/20 1456 06/11/20 2302  AST 21  --   --   ALT 17  --   --   ALKPHOS 97  --   --   BILITOT 1.3*  --   --   PROT 6.3*  --   --   ALBUMIN 3.3* 2.9* 2.4*   No results for input(s): LIPASE, AMYLASE in the last 168 hours. CBC: Recent Labs  Lab 06/07/20 2020 06/07/20 2046 06/09/20 0410 06/10/20 0243 06/11/20 0139 06/11/20 2302 06/12/20 0615  WBC 14.7*   < > 11.4* 10.9* 9.7 9.2 9.3  NEUTROABS 13.5*  --   --   --   --   --   --   HGB 11.7*   < > 11.2* 10.0* 9.7* 9.1* 9.7*  HCT 38.8   < > 35.8* 32.2* 29.6* 29.6* 31.3*  MCV 98.5   < > 99.4 97.6 94.0 95.8 94.8  PLT 230   < > 201 203 233 232  220   < > = values in this interval not displayed.   Blood Culture    Component Value Date/Time   SDES URINE, CATHETERIZED 06/09/2020 1800   SPECREQUEST  06/09/2020 1800    NONE Performed at [facility] [address], [place], [place] [address]    CULT MULTIPLE SPECIES PRESENT, SUGGEST RECOLLECTION (A) 06/09/2020 1800   REPTSTATUS 06/10/2020 FINAL 06/09/2020 1800    Cardiac Enzymes: No results for input(s): CKTOTAL, CKMB, CKMBINDEX, TROPONINI in the last 168 hours. CBG: Recent Labs  Lab 06/08/20 2019  GLUCAP 101*   Iron Studies: No results for input(s): IRON, TIBC, TRANSFERRIN, FERRITIN in the last 72 hours. @lablastinr3 @ Studies/Results: No results found. Medications: . [START ON 06/14/2020] ferric gluconate (FERRLECIT/NULECIT) IV    . [START ON 06/14/2020] ferric gluconate (FERRLECIT/NULECIT) IV    . heparin  1,800 Units/hr (06/12/20 0417)  . methocarbamol (ROBAXIN) IV     . amiodarone  200 mg Oral Daily  . calcium acetate  1,334 mg Oral TID WC  . cephALEXin  250 mg Oral Daily  . latanoprost  1 drop Both Eyes QHS  . multivitamin  1 tablet Oral QHS  . saccharomyces boulardii  250 mg Oral Daily  . timolol  1 drop Both Eyes Daily  . warfarin  2.5 mg Oral ONCE-1600  . Warfarin - Pharmacist Dosing Inpatient   Does not apply q1600     Dialysis Orders: Center:[facility], T,Th, S 4 hrs 180NRe 400/800 77.5 kg 2.0 K/ 2.0 Ca UFP 4 AVF -Heparin 1000 units IV TIW -Mircera 150 mcg IV q 2 weeks (last dose 05/28/2020 last HGB 10.6 06/04/2020) -Venofer 100 mg IV X 10 doses (1/10 doses given) Tsat 19 05/28/2020.  Assessment/Plan: 1. L Hip fracture S/P mechanical fall-Seen by orthopedics. L hemiarthroplasty12/21/2021. Per primary/Orthopedics.  2. Acute on chronic diastolic HF-Last DI26 to 41% 06/08/2020.Hadevidence of mild volume overload by exam/CXR 12/20.Volume excess improved post HD. Monitor closely. 3. ESRD - T,Th,S.HD today to resume T,Th, S schedule. Next HD  06/14/2020 on Holiday Schedule. Issues with cramping. Lowered machine temp-change to UFP 2 which has helped.  4. Hypertension- BP is volume controlled.Better control today.No home BP meds.  5. Anemia - HGB9.7ESA due 06/11/2020.Will order for Aranesp 25 mcg IV for 12/26.Continue OP Fe load. 6. Metabolic bone disease - C Ca OK. Continue binders. 7. Nutrition - Albumin OK. Renal diet, renal vitamins, nepro when eating.  8. PAF-on coumadin. Management per primary/pharmacy.  9. Prolonged QTc in setting of RBBB-no benadryl or agents that could increase QTc  Kwane Rohl H. Magdelena Kinsella NP-C 06/12/2020, 11:26 AM  [employer] [phone]

## 2020-06-13 DIAGNOSIS — I480 Paroxysmal atrial fibrillation: Secondary | ICD-10-CM | POA: Diagnosis not present

## 2020-06-13 DIAGNOSIS — J9601 Acute respiratory failure with hypoxia: Secondary | ICD-10-CM | POA: Diagnosis not present

## 2020-06-13 DIAGNOSIS — Z7901 Long term (current) use of anticoagulants: Secondary | ICD-10-CM | POA: Diagnosis not present

## 2020-06-13 DIAGNOSIS — S72002A Fracture of unspecified part of neck of left femur, initial encounter for closed fracture: Secondary | ICD-10-CM | POA: Diagnosis not present

## 2020-06-13 LAB — CBC
HCT: 32 % — ABNORMAL LOW (ref 36.0–46.0)
Hemoglobin: 9.8 g/dL — ABNORMAL LOW (ref 12.0–15.0)
MCH: 29.6 pg (ref 26.0–34.0)
MCHC: 30.6 g/dL (ref 30.0–36.0)
MCV: 96.7 fL (ref 80.0–100.0)
Platelets: 217 10*3/uL (ref 150–400)
RBC: 3.31 MIL/uL — ABNORMAL LOW (ref 3.87–5.11)
RDW: 16.6 % — ABNORMAL HIGH (ref 11.5–15.5)
WBC: 8 10*3/uL (ref 4.0–10.5)
nRBC: 0 % (ref 0.0–0.2)

## 2020-06-13 LAB — RENAL FUNCTION PANEL
Albumin: 2.4 g/dL — ABNORMAL LOW (ref 3.5–5.0)
Anion gap: 9 (ref 5–15)
BUN: 24 mg/dL — ABNORMAL HIGH (ref 8–23)
CO2: 28 mmol/L (ref 22–32)
Calcium: 8.8 mg/dL — ABNORMAL LOW (ref 8.9–10.3)
Chloride: 98 mmol/L (ref 98–111)
Creatinine, Ser: 4.33 mg/dL — ABNORMAL HIGH (ref 0.44–1.00)
GFR, Estimated: 10 mL/min — ABNORMAL LOW (ref >60)
Glucose, Bld: 87 mg/dL (ref 70–99)
Phosphorus: 2.5 mg/dL (ref 2.5–4.6)
Potassium: 4.5 mmol/L (ref 3.5–5.1)
Sodium: 135 mmol/L (ref 135–145)

## 2020-06-13 LAB — PROTIME-INR
INR: 2.2 — ABNORMAL HIGH (ref 0.8–1.2)
Prothrombin Time: 23.3 seconds — ABNORMAL HIGH (ref 11.4–15.2)

## 2020-06-13 LAB — HEPARIN LEVEL (UNFRACTIONATED): Heparin Unfractionated: 0.41 IU/mL (ref 0.30–0.70)

## 2020-06-13 MED ORDER — WARFARIN SODIUM 2.5 MG PO TABS
2.5000 mg | ORAL_TABLET | Freq: Once | ORAL | Status: AC
  Administered 2020-06-13: 2.5 mg via ORAL
  Filled 2020-06-13: qty 1

## 2020-06-13 NOTE — Progress Notes (Signed)
[facility] Progress Note   Subjective:Seen in room, slightly tearful D/T being in hospital on Xmas. Hoping for DC to [facility] Monday. Says she cannot burden her daughter doing home health PT. L hip sore but has been up in chair. HD tomorrow on schedule.      Objective Vitals:   06/12/20 0527 06/12/20 0929 06/12/20 1705 06/13/20 0500  BP: (!) 100/44 (!) 131/50 (!) 126/49 (!) 146/55  Pulse: 75 74 76 72  Resp: 16 18 18 15   Temp: 99.5 F (37.5 C) (!) 97.5 F (36.4 C) 98.2 F (36.8 C) 98.6 F (37 C)  TempSrc: Oral Oral Oral Oral  SpO2: 90% 95% 99% 93%  Weight:      Height:       Physical Exam General:Pleasant elderly female in NAD Heart:S1 G64/4 systolic M. Lungs:CTAB Abdomen:Active BS, ND Extremities:Trace BLE edema.Drsg L hip intact Dialysis Access:L AVF aneurysmal+T/B    Additional Objective Labs: Basic Metabolic Panel: Recent Labs  Lab 06/08/20 1456 06/09/20 0410 06/11/20 2302 06/12/20 0615 06/13/20 0155  NA 137   < > 131* 138 135  K 5.5*   < > 3.6 4.0 4.5  CL 94*   < > 90* 98 98  CO2 28   < > 26 29 28   GLUCOSE 111*   < > 113* 117* 87  BUN 41*   < > 40* 11 24*  CREATININE 6.59*   < > 5.64* 2.67* 4.33*  CALCIUM 9.0   < > 9.1 8.2* 8.8*  PHOS 5.0*  --  3.8  --  2.5   < > = values in this interval not displayed.   Liver Function Tests: Recent Labs  Lab 06/07/20 2020 06/08/20 1456 06/11/20 2302 06/13/20 0155  AST 21  --   --   --   ALT 17  --   --   --   ALKPHOS 97  --   --   --   BILITOT 1.3*  --   --   --   PROT 6.3*  --   --   --   ALBUMIN 3.3* 2.9* 2.4* 2.4*   No results for input(s): LIPASE, AMYLASE in the last 168 hours. CBC: Recent Labs  Lab 06/07/20 2020 06/07/20 2046 06/10/20 0243 06/11/20 0139 06/11/20 2302 06/12/20 0615 06/13/20 0155  WBC 14.7*   < > 10.9* 9.7 9.2 9.3 8.0  NEUTROABS 13.5*  --   --   --   --   --   --   HGB 11.7*   < > 10.0* 9.7* 9.1* 9.7* 9.8*  HCT 38.8   < > 32.2* 29.6* 29.6* 31.3* 32.0*   MCV 98.5   < > 97.6 94.0 95.8 94.8 96.7  PLT 230   < > 203 233 232 220 217   < > = values in this interval not displayed.   Blood Culture    Component Value Date/Time   SDES URINE, CATHETERIZED 06/09/2020 1800   SPECREQUEST  06/09/2020 1800    NONE Performed at [facility] [address], [place], [place] [address]    CULT MULTIPLE SPECIES PRESENT, SUGGEST RECOLLECTION (A) 06/09/2020 1800   REPTSTATUS 06/10/2020 FINAL 06/09/2020 1800    Cardiac Enzymes: No results for input(s): CKTOTAL, CKMB, CKMBINDEX, TROPONINI in the last 168 hours. CBG: Recent Labs  Lab 06/08/20 2019  GLUCAP 101*   Iron Studies: No results for input(s): IRON, TIBC, TRANSFERRIN, FERRITIN in the last 72 hours. @lablastinr3 @ Studies/Results: No results found. Medications: Marland Kitchen [  START ON 06/14/2020] ferric gluconate (FERRLECIT/NULECIT) IV    . [START ON 06/14/2020] ferric gluconate (FERRLECIT/NULECIT) IV    . methocarbamol (ROBAXIN) IV     . amiodarone  200 mg Oral Daily  . calcium acetate  1,334 mg Oral TID WC  . cephALEXin  250 mg Oral Daily  . [START ON 06/14/2020] darbepoetin (ARANESP) injection - DIALYSIS  25 mcg Intravenous Once  . latanoprost  1 drop Both Eyes QHS  . multivitamin  1 tablet Oral QHS  . saccharomyces boulardii  250 mg Oral Daily  . timolol  1 drop Both Eyes Daily  . Warfarin - Pharmacist Dosing Inpatient   Does not apply q1600     Dialysis Orders: Center:[facility], T,Th, S 4 hrs 180NRe 400/800 77.5 kg 2.0 K/ 2.0 Ca UFP 4 AVF -Heparin 1000 units IV TIW -Mircera 150 mcg IV q 2 weeks (last dose 05/28/2020 last HGB 10.6 06/04/2020) -Venofer 100 mg IV X 10 doses (1/10 doses given) Tsat 19 05/28/2020.  Assessment/Plan: 1. L Hip fracture S/P mechanical fall-Seen by orthopedics. L hemiarthroplasty12/21/2021. Per primary/Orthopedics.  2. Acute on chronic diastolic HF-Last YO06 to 00% 06/08/2020.Hadevidence of mild volume overload by exam/CXR 12/20.Volume excess improved  post HD. Monitor closely. 3. ESRD - T,Th,S.HDtoday to resume T,Th, Sschedule.Next HD 06/14/2020 on Holiday Schedule.Issues with cramping. Lowered machine temp-change to UFP 2 which has helped. 4. Hypertension- BP is volume controlled.Better control today.No home BP meds.  5. Anemia - HGB9.7ESA due 06/11/2020.Will order for Aranesp 25 mcg IV for 12/26.Continue OP Fe load. 6. Metabolic bone disease - C Ca OK. Continue binders. 7. Nutrition - Albumin OK. Renal diet, renal vitamins, nepro when eating.  8. PAF-on coumadin. Management per primary/pharmacy.  9. Prolonged QTc in setting of RBBB-no benadryl or agents that could increase QTc   Melbert Botelho H. Carloyn Lahue NP-C 06/13/2020, 9:46 AM  [employer] [phone]

## 2020-06-13 NOTE — Progress Notes (Signed)
Physical Therapy Treatment Patient Details Name: Chizaram Latino MRN: [id number] DOB: 10-06-1945 Today's Date: 06/13/2020    History of Present Illness Pt is a 74 y.o. female admitted 06/07/20 after fall sustaining L hip femoral neck fx. S/p posterior hemiarthroplasty 12/21. PMH includes ESRD (HD TTS), CHF, PAF, HTN, L TKA (10/2019).   PT Comments    Pt progressing well with mobility; motivated to participate and regain PLOF. Today's session focused on transfer and gait training with RW; pt reports this is her first time out of bed today. Pt remains limited by pain, generalized weakness, decreased activity tolerance and impaired balance strategies;  At high risk for falls. Continue to recommend SNF-level therapies to maximize functional mobility and independence prior to return home; pt planning for SNF.   Follow Up Recommendations  SNF;Supervision for mobility/OOB     Equipment Recommendations  3in1 (PT);Rolling walker with 5" wheels (w/c won't fit through doorways)    Recommendations for Other Services       Precautions / Restrictions Precautions Precautions: Fall;Posterior Hip Precaution Comments: Pt able to state 2/3 precautions at beginning of session, required cues for no internal rotation Restrictions Weight Bearing Restrictions: Yes LLE Weight Bearing: Weight bearing as tolerated    Mobility  Bed Mobility Overal bed mobility: Needs Assistance Bed Mobility: Supine to Sit     Supine to sit: Min assist;HOB elevated     General bed mobility comments: cues to maintain posterior hip precautions and assist for Lt LE to move to EOB. pt able to scoot anteriorly to get feet on floor.  Transfers Overall transfer level: Needs assistance Equipment used: Rolling walker (2 wheeled) Transfers: Sit to/from Stand Sit to Stand: Min assist         General transfer comment: Cues to not flex past 90'; minA to assist trnk elevation. Poor eccentric control into  sitting  Ambulation/Gait Ambulation/Gait assistance: Min assist Gait Distance (Feet): 80 Feet Assistive device: Rolling walker (2 wheeled) Gait Pattern/deviations: Step-to pattern;Step-through pattern;Decreased stride length;Antalgic;Trunk flexed Gait velocity: Decreased   General Gait Details: Cues for increased LLE WBAT, increased RLE step length and closer proximity to RW; minA for RW management; significant fatigue, denies dizziness   Stairs             Wheelchair Mobility    Modified Rankin (Stroke Patients Only)       Balance Overall balance assessment: Needs assistance Sitting-balance support: No upper extremity supported;Feet supported;Bilateral upper extremity supported Sitting balance-Leahy Scale: Fair     Standing balance support: During functional activity;Bilateral upper extremity supported Standing balance-Leahy Scale: Poor Standing balance comment: reliant on UE support                            Cognition Arousal/Alertness: Awake/alert Behavior During Therapy: WFL for tasks assessed/performed Overall Cognitive Status: Within Functional Limits for tasks assessed                                        Exercises      General Comments        Pertinent Vitals/Pain Pain Assessment: Faces Faces Pain Scale: Hurts little more Pain Location: L hip Pain Descriptors / Indicators: Discomfort;Sore Pain Intervention(s): Monitored during session;Patient requesting pain meds-RN notified;Ice applied    Home Living  Prior Function            PT Goals (current goals can now be found in the care plan section) Progress towards PT goals: Progressing toward goals    Frequency    Min 3X/week      PT Plan Frequency needs to be updated    Co-evaluation              AM-PAC PT "6 Clicks" Mobility   Outcome Measure  Help needed turning from your back to your side while in a flat bed  without using bedrails?: A Little Help needed moving from lying on your back to sitting on the side of a flat bed without using bedrails?: A Little Help needed moving to and from a bed to a chair (including a wheelchair)?: A Little Help needed standing up from a chair using your arms (e.g., wheelchair or bedside chair)?: A Little Help needed to walk in hospital room?: A Little Help needed climbing 3-5 steps with a railing? : A Lot 6 Click Score: 17    End of Session Equipment Utilized During Treatment: Gait belt Activity Tolerance: Patient tolerated treatment well Patient left: in chair;with call bell/phone within reach;with chair alarm set Nurse Communication: Mobility status PT Visit Diagnosis: Unsteadiness on feet (R26.81);Muscle weakness (generalized) (M62.81);Pain Pain - Right/Left: Left Pain - part of body: Hip     Time: [id number] PT Time Calculation (min) (ACUTE ONLY): 29 min  Charges:  $Gait Training: 8-22 mins $Therapeutic Activity: 8-22 mins                    Mabeline Caras, PT, DPT Acute Rehabilitation Services  Pager [phone] Office [place] 06/13/2020, 3:54 PM

## 2020-06-13 NOTE — Progress Notes (Signed)
ANTICOAGULATION CONSULT NOTE - Follow Up Consult  Pharmacy Consult for heparin Indication: atrial fibrillation  Labs: Recent Labs    06/11/20 0139 06/11/20 0742 06/11/20 1835 06/11/20 2302 06/12/20 0615 06/12/20 1241 06/13/20 0155  HGB 9.7*  --   --  9.1* 9.7*  --  9.8*  HCT 29.6*  --   --  29.6* 31.3*  --  32.0*  PLT 233  --   --  232 220  --  217  LABPROT  --  22.4*  --   --  22.2*  --  23.3*  INR  --  2.0*  --   --  2.0*  --  2.2*  HEPARINUNFRC  --  0.17*   < >  --  0.13* 0.59 0.41  CREATININE 4.05*  --   --  5.64* 2.67*  --   --    < > = values in this interval not displayed.    Assessment/Plan:  74yo female therapeutic on heparin and remains therapeutic on warfarin; per pharmacist-MD discussion yesterday, will d/c heparin.   Wynona Neat, PharmD, BCPS  06/13/2020,2:58 AM

## 2020-06-13 NOTE — Progress Notes (Signed)
Subjective: 4 Days Post-Op s/p Procedure(s): ARTHROPLASTY BIPOLAR HIP (HEMIARTHROPLASTY)  Patient states left hip is sore when she is moved on bed but otherwise pain is well controlled. No other complaints.   Objective:  PE: VITALS:   Vitals:   06/12/20 0527 06/12/20 0929 06/12/20 1705 06/13/20 0500  BP: (!) 100/44 (!) 131/50 (!) 126/49 (!) 146/55  Pulse: 75 74 76 72  Resp: 16 18 18 15   Temp: 99.5 F (37.5 C) (!) 97.5 F (36.4 C) 98.2 F (36.8 C) 98.6 F (37 C)  TempSrc: Oral Oral Oral Oral  SpO2: 90% 95% 99% 93%  Weight:      Height:       General: alert, oriented, sitting up in bed, in no acute distress GI: abdomen soft, nontender MSK: LLE - dorsiflexion and plantarflexion intact. Able to move all toes. Distal sensation intact. Foot warm and well perfused. Mild TTP to left hip. Dressing CDI. No ecchymosis.   LABS  Results for orders placed or performed during the hospital encounter of 06/07/20 (from the past 24 hour(s))  Heparin level (unfractionated)     Status: None   Collection Time: 06/12/20 12:41 PM  Result Value Ref Range   Heparin Unfractionated 0.59 0.30 - 0.70 IU/mL  CBC     Status: Abnormal   Collection Time: 06/13/20  1:55 AM  Result Value Ref Range   WBC 8.0 4.0 - 10.5 K/uL   RBC 3.31 (L) 3.87 - 5.11 MIL/uL   Hemoglobin 9.8 (L) 12.0 - 15.0 g/dL   HCT 32.0 (L) 36.0 - 46.0 %   MCV 96.7 80.0 - 100.0 fL   MCH 29.6 26.0 - 34.0 pg   MCHC 30.6 30.0 - 36.0 g/dL   RDW 16.6 (H) 11.5 - 15.5 %   Platelets 217 150 - 400 K/uL   nRBC 0.0 0.0 - 0.2 %  Heparin level (unfractionated)     Status: None   Collection Time: 06/13/20  1:55 AM  Result Value Ref Range   Heparin Unfractionated 0.41 0.30 - 0.70 IU/mL  Protime-INR     Status: Abnormal   Collection Time: 06/13/20  1:55 AM  Result Value Ref Range   Prothrombin Time 23.3 (H) 11.4 - 15.2 seconds   INR 2.2 (H) 0.8 - 1.2  Renal function panel     Status: Abnormal   Collection Time: 06/13/20  1:55 AM   Result Value Ref Range   Sodium 135 135 - 145 mmol/L   Potassium 4.5 3.5 - 5.1 mmol/L   Chloride 98 98 - 111 mmol/L   CO2 28 22 - 32 mmol/L   Glucose, Bld 87 70 - 99 mg/dL   BUN 24 (H) 8 - 23 mg/dL   Creatinine, Ser 4.33 (H) 0.44 - 1.00 mg/dL   Calcium 8.8 (L) 8.9 - 10.3 mg/dL   Phosphorus 2.5 2.5 - 4.6 mg/dL   Albumin 2.4 (L) 3.5 - 5.0 g/dL   GFR, Estimated 10 (L) >60 mL/min   Anion gap 9 5 - 15    No results found.  Assessment/Plan: Principal Problem:   Hip fracture (HCC) Active Problems:   Leukocytosis   Acute hypoxemic respiratory failure (HCC)   Atrial fibrillation (HCC)   ESRD (end stage renal disease) ([place])  4 Days Post-Op s/p Procedure(s): ARTHROPLASTY BIPOLAR HIP (HEMIARTHROPLASTY)  Weightbearing: WBAT LLE, up with therapy as tolerated Insicional and dressing care: Reinforce dressings as needed, dressing CDI this am, will change dressing tomorrow AM.  VTE prophylaxis: Coumadin at  baseline for PAF, bridging with heparin drip Pain control: continue current regimen, patient tolerated this regimen well with TKA done this past spring Follow - up plan: 2 weeks with Dr. Mardelle Matte Dispo: To SNF vs HHPT Awaiting authorization. Probably Monday. Other care per primary team.   Contact information:   After hours and holidays please check [email or web address] for group call information for [facility] 06/13/2020, 9:42 AM

## 2020-06-13 NOTE — Plan of Care (Signed)

## 2020-06-13 NOTE — Progress Notes (Signed)
[place] for warfarin  Indication:  Atrial fibrillation  Allergies  Allergen Reactions  . Percocet [Oxycodone-Acetaminophen] Nausea And Vomiting  . Vicodin [Hydrocodone-Acetaminophen] Nausea And Vomiting  . Chlorhexidine Rash    Pt states she gets rash similar to "sunburn"    Patient Measurements: Height: 5\' 2"  (157.5 cm) Weight: 82.2 kg (181 lb 3.5 oz) IBW/kg (Calculated) : 50.1  Vital Signs: Temp: 98 F (36.7 C) (12/25 1412) BP: 171/63 (12/25 1412) Pulse Rate: 73 (12/25 1412)  Labs: Recent Labs    06/11/20 0742 06/11/20 1835 06/11/20 2302 06/12/20 0615 06/12/20 1241 06/13/20 0155  HGB  --   --  9.1* 9.7*  --  9.8*  HCT  --   --  29.6* 31.3*  --  32.0*  PLT  --   --  232 220  --  217  LABPROT 22.4*  --   --  22.2*  --  23.3*  INR 2.0*  --   --  2.0*  --  2.2*  HEPARINUNFRC 0.17*   < >  --  0.13* 0.59 0.41  CREATININE  --   --  5.64* 2.67*  --  4.33*   < > = values in this interval not displayed.    Estimated Creatinine Clearance: 11.1 mL/min (A) (by C-G formula based on SCr of 4.33 mg/dL (H)).   Medical History: History reviewed. No pertinent past medical history.  Assessment: 74 yr old female admitted on 06/07/20 with L femoral neck fracture; pt is S/P hemiarthroplasty this evening.  Pt was taking warfarin at home PTA for atrial fibrillation (5 mg on Tues/Thurs, 2.5 mg on other days); INR on admission was 2.4, INR this AM was down to 1.3. Warfarin was held since admission for surgery, and pt was given vitamin K 5 mg IV X 1 on 12/19. Pharmacy is consulted to resume warfarin dosing for atrial fibrillation; also on heparin bridge.  Heparin stopped earlier today with INR at [facility] > 2.   Hgb stable at 9, platelets are stable 200s. On amiodarone PTA, which is continued during this admission.   Goal of Therapy:  INR 2-3   Heparin level 0.3-0.7 units/ml Monitor platelets by anticoagulation protocol: Yes   Plan:  Warfarin  2.5 mg PO tonight Monitor daily, INR, CBC Monitor for signs/symptoms of bleeding   Bonnita Nasuti Pharm.D. CPP, BCPS Clinical Pharmacist [phone] 06/13/2020 6:04 PM    **Pharmacist phone directory can be found on [email or web address] listed under [place]**

## 2020-06-13 NOTE — Progress Notes (Signed)
PROGRESS NOTE  [name] NWG:[id number] DOB: 05-13-1946 DOA: 06/07/2020 PCP: No primary care provider on file.   LOS: 6 days   Brief Narrative / Interim history: 74 year old female, lives with daughter, uses cane while ambulating out of her house, PMH of dilated cardiomyopathy with EF of 20-25% (EF has since normalized) in the setting of sepsis in 5784 complicated by demand ischemia, cardiac catheterization 2014 showed normal coronaries, chronic diastolic CHF, PAF on Coumadin anticoagulation, moderate to severe aortic stenosis, tolerated TKR in 5/21 well, per cardiology may need consideration of AVR at some point, hyperlipidemia, essential hypertension, ESRD on TTS HD, GERD, migraine and glaucoma presented to the ED following a mechanical fall at home with head injury and left hip pain.  She reportedly was trying to close her window blinds when she turned around, lost her balance, fell hit her head on the windowsill and landed on her left hip.  In the ED, hypoxic to 88% on room air and placed on oxygen.  CT head negative for acute abnormalities.  X-rays demonstrated acute left femoral neck fracture with superior subluxation.  Orthopedics consulted and s/p repair 12/21   Subjective / 24h Interval events: No significant complaints.  2 loose stools yesterday  Assessment & Plan: Principal Problem Acute left hip femoral neck fracture -status post mechanical fall at home, orthopedic surgery consulted and she is s/p right hemiarthroplasty on 12/21 -Initially requesting CIR however her insurance will not cover.  Awaiting SNF placement on Monday  Active Problems Acute hypoxic respiratory failure-related to acute on chronic diastolic CHF, patient on ESRD, she required 2 L on admission, underwent hemodialysis 12/20 -Most recent 2D echo showed an EF 69-62%, grade 2 diastolic dysfunction -On room air today  Moderate severe arctic stenosis, moderate severe mitral regurg -watch carefully fluid status  postoperatively.  Cardiology consulted and evaluated patient.  C. difficile colonization -Patient had diarrhea and has a history of C. difficile.  C. difficile was checked at the beginning of her hospital stay and she was found to be antigen positive but toxin negative.  Abdomen is soft, nontender, her WBC is normal, this is likely colonization  ESRD on TTS hemodialysis -Underwent dialysis on 12/20.  Nephrology consulted, appreciate input.  Dialysis per nephrology  Paroxysmal A. fib -Patient on Coumadin, she was given vitamin K in preparation for surgery.  Currently she is on sinus, continue amiodarone. -She was bridged back with heparin, heparin now discontinued as her INR has been in the therapeutic range for the past couple of days  Essential hypertension -Blood pressure now normalized  Anemia in the setting of chronic kidney disease/ESRD -Stable, postoperatively her hemoglobin is stable  Leukocytosis -Likely stress margination in the setting of fracture.  Resolved, WBC normal  Chest x-ray abnormality -Per chest x-ray 12/19: Question focal osseous irregularity near the left coracoid process, favored to be secondary to patient positioning. -Requested orthopedics to weigh in, if this needs any further evaluation.  Moderate-sized hiatal hernia/GERD -Does not appear to be on any medications at home.   Scheduled Meds: . amiodarone  200 mg Oral Daily  . calcium acetate  1,334 mg Oral TID WC  . cephALEXin  250 mg Oral Daily  . [START ON 06/14/2020] darbepoetin (ARANESP) injection - DIALYSIS  25 mcg Intravenous Once  . latanoprost  1 drop Both Eyes QHS  . multivitamin  1 tablet Oral QHS  . saccharomyces boulardii  250 mg Oral Daily  . timolol  1 drop Both Eyes Daily  . Warfarin -  Pharmacist Dosing Inpatient   Does not apply q1600   Continuous Infusions: . [START ON 06/14/2020] ferric gluconate (FERRLECIT/NULECIT) IV    . [START ON 06/14/2020] ferric gluconate  (FERRLECIT/NULECIT) IV    . methocarbamol (ROBAXIN) IV     PRN Meds:.acetaminophen, bisacodyl, hydrALAZINE, HYDROmorphone (DILAUDID) injection, loperamide, menthol-cetylpyridinium **OR** phenol, methocarbamol **OR** methocarbamol (ROBAXIN) IV, ondansetron **OR** ondansetron (ZOFRAN) IV, oxyCODONE, oxyCODONE, polyethylene glycol  Diet Orders (From admission, onward)    Start     Ordered   06/09/20 2153  Diet renal with fluid restriction Fluid restriction: 1200 mL Fluid; Room service appropriate? Yes; Fluid consistency: Thin  Diet effective now       Question Answer Comment  Fluid restriction: 1200 mL Fluid   Room service appropriate? Yes   Fluid consistency: Thin      06/09/20 2152         DVT prophylaxis: SCDs Start: 06/09/20 2153 SCDs Start: 06/07/20 2354    Code Status: Full Code  Family Communication: no family at bedside   Status is: Inpatient  Remains inpatient appropriate because:Inpatient level of care appropriate due to severity of illness  Dispo: The patient is from: Home              Anticipated d/c is to: Home              Anticipated d/c date is: 3 days              Patient currently is medically stable to d/c.  Consultants:  None   Procedures:  None   Microbiology  None   Antimicrobials: None     Objective: Vitals:   06/12/20 0527 06/12/20 0929 06/12/20 1705 06/13/20 0500  BP: (!) 100/44 (!) 131/50 (!) 126/49 (!) 146/55  Pulse: 75 74 76 72  Resp: 16 18 18 15   Temp: 99.5 F (37.5 C) (!) 97.5 F (36.4 C) 98.2 F (36.8 C) 98.6 F (37 C)  TempSrc: Oral Oral Oral Oral  SpO2: 90% 95% 99% 93%  Weight:      Height:        Intake/Output Summary (Last 24 hours) at 06/13/2020 0919 Last data filed at 06/12/2020 1900 Gross per 24 hour  Intake 480 ml  Output --  Net 480 ml   Filed Weights   06/10/20 0402 06/11/20 2220 06/12/20 0500  Weight: 76.5 kg 82.1 kg 82.2 kg    Examination:  Constitutional: No distress Eyes: No scleral icterus ENMT:  Moist mucous membranes Neck: normal, supple Respiratory: Lungs are clear bilaterally without wheezing Cardiovascular: Regular rate and rhythm, 3/6 SEM no new murmurs, no edema Abdomen: Soft, NT, ND, bowel sounds positive Musculoskeletal: no clubbing / cyanosis.  Skin: No rashes Neurologic: Nonfocal  Data Reviewed: I have independently reviewed following labs and imaging studies   CBC: Recent Labs  Lab 06/07/20 2020 06/07/20 2046 06/10/20 0243 06/11/20 0139 06/11/20 2302 06/12/20 0615 06/13/20 0155  WBC 14.7*   < > 10.9* 9.7 9.2 9.3 8.0  NEUTROABS 13.5*  --   --   --   --   --   --   HGB 11.7*   < > 10.0* 9.7* 9.1* 9.7* 9.8*  HCT 38.8   < > 32.2* 29.6* 29.6* 31.3* 32.0*  MCV 98.5   < > 97.6 94.0 95.8 94.8 96.7  PLT 230   < > 203 233 232 220 217   < > = values in this interval not displayed.   Basic Metabolic Panel: Recent Labs  Lab  06/08/20 1456 06/09/20 0410 06/10/20 0243 06/11/20 0139 06/11/20 2302 06/12/20 0615 06/13/20 0155  NA 137   < > 138 134* 131* 138 135  K 5.5*   < > 4.9 3.7 3.6 4.0 4.5  CL 94*   < > 97* 95* 90* 98 98  CO2 28   < > 24 24 26 29 28   GLUCOSE 111*   < > 120* 95 113* 117* 87  BUN 41*   < > 35* 23 40* 11 24*  CREATININE 6.59*   < > 5.85* 4.05* 5.64* 2.67* 4.33*  CALCIUM 9.0   < > 8.8* 8.2* 9.1 8.2* 8.8*  PHOS 5.0*  --   --   --  3.8  --  2.5   < > = values in this interval not displayed.   Liver Function Tests: Recent Labs  Lab 06/07/20 2020 06/08/20 1456 06/11/20 2302 06/13/20 0155  AST 21  --   --   --   ALT 17  --   --   --   ALKPHOS 97  --   --   --   BILITOT 1.3*  --   --   --   PROT 6.3*  --   --   --   ALBUMIN 3.3* 2.9* 2.4* 2.4*   Coagulation Profile: Recent Labs  Lab 06/09/20 0742 06/10/20 0243 06/11/20 0742 06/12/20 0615 06/13/20 0155  INR 1.3* 1.3* 2.0* 2.0* 2.2*   HbA1C: No results for input(s): HGBA1C in the last 72 hours. CBG: Recent Labs  Lab 06/08/20 2019  GLUCAP 101*    Recent Results (from the  past 240 hour(s))  Resp Panel by RT-PCR (Flu A&B, Covid) Nasopharyngeal Swab     Status: None   Collection Time: 06/07/20  8:43 PM   Specimen: Nasopharyngeal Swab; Nasopharyngeal(NP) swabs in vial transport medium  Result Value Ref Range Status   SARS Coronavirus 2 by RT PCR NEGATIVE NEGATIVE Final    Comment: (NOTE) SARS-CoV-2 target nucleic acids are NOT DETECTED.  The SARS-CoV-2 RNA is generally detectable in upper respiratory specimens during the acute phase of infection. The lowest concentration of SARS-CoV-2 viral copies this assay can detect is 138 copies/mL. A negative result does not preclude SARS-Cov-2 infection and should not be used as the sole basis for treatment or other patient management decisions. A negative result may occur with  improper specimen collection/handling, submission of specimen other than nasopharyngeal swab, presence of viral mutation(s) within the areas targeted by this assay, and inadequate number of viral copies(<138 copies/mL). A negative result must be combined with clinical observations, patient history, and epidemiological information. The expected result is Negative.  Fact Sheet for Patients:  EntrepreneurPulse.com.au  Fact Sheet for Healthcare Providers:  IncredibleEmployment.be  This test is no t yet approved or cleared by the [place] FDA and  has been authorized for detection and/or diagnosis of SARS-CoV-2 by FDA under an Emergency Use Authorization (EUA). This EUA will remain  in effect (meaning this test can be used) for the duration of the COVID-19 declaration under Section 564(b)(1) of the Act, 21 U.S.C.section 360bbb-3(b)(1), unless the authorization is terminated  or revoked sooner.       Influenza A by PCR NEGATIVE NEGATIVE Final   Influenza B by PCR NEGATIVE NEGATIVE Final    Comment: (NOTE) The Xpert Xpress SARS-CoV-2/FLU/RSV plus assay is intended as an aid in the diagnosis of  influenza from Nasopharyngeal swab specimens and should not be used as a sole basis for treatment. Nasal  washings and aspirates are unacceptable for Xpert Xpress SARS-CoV-2/FLU/RSV testing.  Fact Sheet for Patients: EntrepreneurPulse.com.au  Fact Sheet for Healthcare Providers: IncredibleEmployment.be  This test is not yet approved or cleared by the [place] FDA and has been authorized for detection and/or diagnosis of SARS-CoV-2 by FDA under an Emergency Use Authorization (EUA). This EUA will remain in effect (meaning this test can be used) for the duration of the COVID-19 declaration under Section 564(b)(1) of the Act, 21 U.S.C. section 360bbb-3(b)(1), unless the authorization is terminated or revoked.  Performed at [facility], [place] [address], [place], [place] [address]   Urine Culture     Status: Abnormal   Collection Time: 06/09/20  6:00 PM   Specimen: Urine, Catheterized  Result Value Ref Range Status   Specimen Description URINE, CATHETERIZED  Final   Special Requests   Final    NONE Performed at [facility], [place] [address] [place], [place] [address]    Culture MULTIPLE SPECIES PRESENT, SUGGEST RECOLLECTION (A)  Final   Report Status 06/10/2020 FINAL  Final  C Difficile Quick Screen (NO PCR Reflex)     Status: Abnormal   Collection Time: 06/10/20  1:38 PM   Specimen: STOOL  Result Value Ref Range Status   C Diff antigen POSITIVE (A) NEGATIVE Final   C Diff toxin NEGATIVE NEGATIVE Final   C Diff interpretation   Final    Results are indeterminate. Please contact the provider listed for your campus for C diff questions in [place].    Comment: Performed at [facility], [place] [address], [place], [place] [address]     Radiology Studies: No results found.  Marzetta Board, MD, PhD [facility]  Between 7 am - 7 pm I am available, please contact me via Amion or Securechat  Between 7 pm - 7 am I am not  available, please contact night coverage MD/APP via Amion

## 2020-06-14 DIAGNOSIS — Z7901 Long term (current) use of anticoagulants: Secondary | ICD-10-CM | POA: Diagnosis not present

## 2020-06-14 DIAGNOSIS — I480 Paroxysmal atrial fibrillation: Secondary | ICD-10-CM | POA: Diagnosis not present

## 2020-06-14 DIAGNOSIS — S72002A Fracture of unspecified part of neck of left femur, initial encounter for closed fracture: Secondary | ICD-10-CM | POA: Diagnosis not present

## 2020-06-14 DIAGNOSIS — J9601 Acute respiratory failure with hypoxia: Secondary | ICD-10-CM | POA: Diagnosis not present

## 2020-06-14 LAB — CBC
HCT: 31.1 % — ABNORMAL LOW (ref 36.0–46.0)
Hemoglobin: 9.3 g/dL — ABNORMAL LOW (ref 12.0–15.0)
MCH: 29.1 pg (ref 26.0–34.0)
MCHC: 29.9 g/dL — ABNORMAL LOW (ref 30.0–36.0)
MCV: 97.2 fL (ref 80.0–100.0)
Platelets: 269 10*3/uL (ref 150–400)
RBC: 3.2 MIL/uL — ABNORMAL LOW (ref 3.87–5.11)
RDW: 16.4 % — ABNORMAL HIGH (ref 11.5–15.5)
WBC: 9.1 10*3/uL (ref 4.0–10.5)
nRBC: 0 % (ref 0.0–0.2)

## 2020-06-14 LAB — RENAL FUNCTION PANEL
Albumin: 2.3 g/dL — ABNORMAL LOW (ref 3.5–5.0)
Anion gap: 12 (ref 5–15)
BUN: 42 mg/dL — ABNORMAL HIGH (ref 8–23)
CO2: 23 mmol/L (ref 22–32)
Calcium: 9 mg/dL (ref 8.9–10.3)
Chloride: 95 mmol/L — ABNORMAL LOW (ref 98–111)
Creatinine, Ser: 6.17 mg/dL — ABNORMAL HIGH (ref 0.44–1.00)
GFR, Estimated: 7 mL/min — ABNORMAL LOW (ref >60)
Glucose, Bld: 83 mg/dL (ref 70–99)
Phosphorus: 2.8 mg/dL (ref 2.5–4.6)
Potassium: 6.2 mmol/L — ABNORMAL HIGH (ref 3.5–5.1)
Sodium: 130 mmol/L — ABNORMAL LOW (ref 135–145)

## 2020-06-14 LAB — PROTIME-INR
INR: 2.3 — ABNORMAL HIGH (ref 0.8–1.2)
Prothrombin Time: 24.1 seconds — ABNORMAL HIGH (ref 11.4–15.2)

## 2020-06-14 MED ORDER — PENTAFLUOROPROP-TETRAFLUOROETH EX AERO: 1.0000 "application " | INHALATION_SPRAY | CUTANEOUS | Status: DC | PRN

## 2020-06-14 MED ORDER — SODIUM CHLORIDE 0.9 % IV SOLN: 100.0000 mL | INTRAVENOUS | Status: DC | PRN

## 2020-06-14 MED ORDER — LIDOCAINE HCL (PF) 1 % IJ SOLN: 5.0000 mL | INTRAMUSCULAR | Status: DC | PRN

## 2020-06-14 MED ORDER — CALCIUM ACETATE (PHOS BINDER) 667 MG PO CAPS
667.0000 mg | ORAL_CAPSULE | Freq: Three times a day (TID) | ORAL | Status: DC
  Administered 2020-06-14 – 2020-06-24 (×27): 667 mg via ORAL
  Filled 2020-06-14 (×32): qty 1

## 2020-06-14 MED ORDER — WARFARIN SODIUM 2.5 MG PO TABS
2.5000 mg | ORAL_TABLET | Freq: Once | ORAL | Status: AC
  Administered 2020-06-14: 2.5 mg via ORAL
  Filled 2020-06-14: qty 1

## 2020-06-14 MED ORDER — ACETAMINOPHEN 325 MG PO TABS
ORAL_TABLET | ORAL | Status: AC
  Administered 2020-06-14: 650 mg via ORAL
  Filled 2020-06-14: qty 2

## 2020-06-14 MED ORDER — LIDOCAINE-PRILOCAINE 2.5-2.5 % EX CREA: 1.0000 "application " | TOPICAL_CREAM | CUTANEOUS | Status: DC | PRN

## 2020-06-14 MED ORDER — DARBEPOETIN ALFA 25 MCG/0.42ML IJ SOSY
PREFILLED_SYRINGE | INTRAMUSCULAR | Status: AC
  Administered 2020-06-14: 25 ug via INTRAVENOUS
  Filled 2020-06-14: qty 0.42

## 2020-06-14 NOTE — TOC Progression Note (Signed)
Transition of Care [facility]) - Progression Note    Patient Details  Name: [name] MRN: [id number] Date of Birth: 1945/11/05  Transition of Care [facility]) CM/SW [place], [place] Phone Number: 06/14/2020, 3:59 PM  Clinical Narrative:     CSW talked to pt and daughter about bed offers. Pt would like to wait until next day to make final decision about bed. Pt is leaning towards [facility].   Expected Discharge Plan: Skilled Nursing Facility Barriers to Discharge: SNF Pending bed offer,Insurance Authorization  Expected Discharge Plan and Services Expected Discharge Plan: [place] Choice: [place] arrangements for the past 2 months: Single Family Home                                       Social Determinants of Health (SDOH) Interventions    Readmission Risk Interventions No flowsheet data found.  Emeterio Reeve, Latanya Presser, [place] Social Worker [phone]

## 2020-06-14 NOTE — Progress Notes (Signed)
[place] for warfarin  Indication:  Atrial fibrillation  Allergies  Allergen Reactions  . Percocet [Oxycodone-Acetaminophen] Nausea And Vomiting  . Vicodin [Hydrocodone-Acetaminophen] Nausea And Vomiting  . Chlorhexidine Rash    Pt states she gets rash similar to "sunburn"    Patient Measurements: Height: 5\' 2"  (157.5 cm) Weight: 83.6 kg (184 lb 4.9 oz) IBW/kg (Calculated) : 50.1  Vital Signs: Temp: 97.7 F (36.5 C) (12/26 0500) Temp Source: Oral (12/26 0500) BP: 153/58 (12/26 0500) Pulse Rate: 71 (12/26 0500)  Labs: Recent Labs    06/11/20 2302 06/12/20 0615 06/12/20 1241 06/13/20 0155 06/14/20 0103  HGB 9.1* 9.7*  --  9.8*  --   HCT 29.6* 31.3*  --  32.0*  --   PLT 232 220  --  217  --   LABPROT  --  22.2*  --  23.3* 24.1*  INR  --  2.0*  --  2.2* 2.3*  HEPARINUNFRC  --  0.13* 0.59 0.41  --   CREATININE 5.64* 2.67*  --  4.33*  --     Estimated Creatinine Clearance: 11.3 mL/min (A) (by C-G formula based on SCr of 4.33 mg/dL (H)).   Medical History: History reviewed. No pertinent past medical history.  Assessment: 74 yr old female admitted on 06/07/20 with L femoral neck fracture; pt is S/P hemiarthroplasty.  Patient was taking warfarin at home prior to admission for atrial fibrillation (5 mg on Tues/Thurs, 2.5 mg on other days).  Warfarin was held for surgery, and pt was given vitamin K 5 mg IV X 1 on 12/19. Patient transitioned to heparin infusion in preparation for surgery. Heparin infusion now off and warfarin resumed 12/24  INR remains in goal this morning. H/H stable with no bleeding noted. Will continue home regimen and monitor closely.  On amiodarone PTA, which is continued during this admission.    Goal of Therapy:  INR 2-3   Heparin level 0.3-0.7 units/ml Monitor platelets by anticoagulation protocol: Yes    Plan:  - Warfarin 2.5 mg PO tonight - Monitor daily, INR, CBC - Monitor for signs/symptoms of  bleeding     Skylene Deremer L. Devin Going, PharmD, [place] PGY2 Pharmacy Resident Weekends 7:00 am - 3:00 pm, please call [phone] 06/14/20      7:41 AM  Please check AMION for all [place] phone numbers After 10:00 PM, call the [place] [phone]

## 2020-06-14 NOTE — Progress Notes (Signed)
[facility] Progress Note   Subjective: Seen on HD. Tolerating well. No physical complaints. Stressed about SNF placement and transportation to HD unit. Emotional support to patient.     Objective Vitals:   06/14/20 0734 06/14/20 0800 06/14/20 0815 06/14/20 0845  BP: 134/69 (!) 126/51 (!) 126/51 (!) 130/49  Pulse:      Resp: 17  16 12   Temp:      TempSrc:      SpO2:      Weight:      Height:       Physical Exam General:Pleasant elderly female in NAD Heart:S1 C58/8 systolic M. Lungs:CTAB Abdomen:Active BS, ND Extremities:Trace BLE edema.Drsg L hip intact Dialysis Access:L AVF aneurysmal cannulated   Additional Objective Labs: Basic Metabolic Panel: Recent Labs  Lab 06/11/20 2302 06/12/20 0615 06/13/20 0155 06/14/20 0803  NA 131* 138 135 130*  K 3.6 4.0 4.5 6.2*  CL 90* 98 98 95*  CO2 26 29 28 23   GLUCOSE 113* 117* 87 83  BUN 40* 11 24* 42*  CREATININE 5.64* 2.67* 4.33* 6.17*  CALCIUM 9.1 8.2* 8.8* 9.0  PHOS 3.8  --  2.5 2.8   Liver Function Tests: Recent Labs  Lab 06/07/20 2020 06/08/20 1456 06/11/20 2302 06/13/20 0155 06/14/20 0803  AST 21  --   --   --   --   ALT 17  --   --   --   --   ALKPHOS 97  --   --   --   --   BILITOT 1.3*  --   --   --   --   PROT 6.3*  --   --   --   --   ALBUMIN 3.3*   < > 2.4* 2.4* 2.3*   < > = values in this interval not displayed.   No results for input(s): LIPASE, AMYLASE in the last 168 hours. CBC: Recent Labs  Lab 06/07/20 2020 06/07/20 2046 06/11/20 0139 06/11/20 2302 06/12/20 0615 06/13/20 0155 06/14/20 0806  WBC 14.7*   < > 9.7 9.2 9.3 8.0 9.1  NEUTROABS 13.5*  --   --   --   --   --   --   HGB 11.7*   < > 9.7* 9.1* 9.7* 9.8* 9.3*  HCT 38.8   < > 29.6* 29.6* 31.3* 32.0* 31.1*  MCV 98.5   < > 94.0 95.8 94.8 96.7 97.2  PLT 230   < > 233 232 220 217 269   < > = values in this interval not displayed.   Blood Culture    Component Value Date/Time   SDES URINE, CATHETERIZED  06/09/2020 1800   SPECREQUEST  06/09/2020 1800    NONE Performed at [facility] [address] [place], [place] [address]    CULT MULTIPLE SPECIES PRESENT, SUGGEST RECOLLECTION (A) 06/09/2020 1800   REPTSTATUS 06/10/2020 FINAL 06/09/2020 1800    Cardiac Enzymes: No results for input(s): CKTOTAL, CKMB, CKMBINDEX, TROPONINI in the last 168 hours. CBG: Recent Labs  Lab 06/08/20 2019  GLUCAP 101*   Iron Studies: No results for input(s): IRON, TIBC, TRANSFERRIN, FERRITIN in the last 72 hours. @lablastinr3 @ Studies/Results: No results found. Medications: . sodium chloride    . sodium chloride    . ferric gluconate (FERRLECIT/NULECIT) IV    . ferric gluconate (FERRLECIT/NULECIT) IV    . methocarbamol (ROBAXIN) IV     . amiodarone  200 mg Oral Daily  . calcium acetate  1,334 mg  Oral TID WC  . cephALEXin  250 mg Oral Daily  . darbepoetin (ARANESP) injection - DIALYSIS  25 mcg Intravenous Once  . latanoprost  1 drop Both Eyes QHS  . multivitamin  1 tablet Oral QHS  . saccharomyces boulardii  250 mg Oral Daily  . timolol  1 drop Both Eyes Daily  . warfarin  2.5 mg Oral Once  . Warfarin - Pharmacist Dosing Inpatient   Does not apply q1600     Dialysis Orders: Center:[facility], T,Th, S 4 hrs 180NRe 400/800 77.5 kg 2.0 K/ 2.0 Ca UFP 4 AVF -Heparin 1000 units IV TIW -Mircera 150 mcg IV q 2 weeks (last dose 05/28/2020 last HGB 10.6 06/04/2020) -Venofer 100 mg IV X 10 doses (1/10 doses given) Tsat 19 05/28/2020.  Assessment/Plan: 1. L Hip fracture S/P mechanical fall-Seen by orthopedics. L hemiarthroplasty12/21/2021. Per primary/Orthopedics.  2. Acute on chronic diastolic HF-Last ZO10 to 96% 06/08/2020.Hadevidence of mild volume overload by exam/CXR 12/20.Volume excess improved post HD. Monitor closely. 3. ESRD - T,Th,S.HDtoday to resume T,Th, Sschedule.HD today on Holiday Schedule. K+ 6.2, slightly hemolysis. Use 2.0 K bath. Recheck K+ this PM. Issues with  cramping. Lowered machine temp-change to UFP 2 which has helped.Please continue these orders on DC.  4. Hypertension- BP is volume controlled.Better control today.No home BP meds.  5. Anemia - HGB9.3ESA due 06/11/2020.Will order forAranesp 25 mcg IV fortoday.Continue OP Fe load. 6. Metabolic bone disease - C Ca OK. PO4 low decrease binders. 7. Nutrition - Albumin OK. Renal diet, renal vitamins, nepro when eating.  8. PAF-on coumadin. Management per primary/pharmacy.  9. Prolonged QTc in setting of RBBB-no benadryl or agents that could increase QTc  Disposition: Planning DC to SNF as insurance will not pay for in patient rehab.   Keedan Sample H. Anaia Frith NP-C 06/14/2020, 9:11 AM  [employer] [phone]

## 2020-06-14 NOTE — Plan of Care (Signed)
  Problem: Education: Goal: Knowledge of General Education information will improve Description: Including pain rating scale, medication(s)/side effects and non-pharmacologic comfort measures Outcome: Progressing   Problem: Clinical Measurements: Goal: Will remain free from infection Outcome: Progressing   Problem: Activity: Goal: Risk for activity intolerance will decrease Outcome: Progressing   

## 2020-06-14 NOTE — Progress Notes (Signed)
12/25 Pt transported by [name], transport support specialist down to dialysis. Handoff given to dialysis nurse,Adraine,RN. Pt stable. Pt complained of no pain. Pt observed to be comfortable.

## 2020-06-14 NOTE — Progress Notes (Signed)
PROGRESS NOTE  [name] VOH:[id number] DOB: Aug 25, 1945 DOA: 06/07/2020 PCP: No primary care provider on file.   LOS: 7 days   Brief Narrative / Interim history: 74 year old female, lives with daughter, uses cane while ambulating out of her house, PMH of dilated cardiomyopathy with EF of 20-25% (EF has since normalized) in the setting of sepsis in 6948 complicated by demand ischemia, cardiac catheterization 2014 showed normal coronaries, chronic diastolic CHF, PAF on Coumadin anticoagulation, moderate to severe aortic stenosis, tolerated TKR in 5/21 well, per cardiology may need consideration of AVR at some point, hyperlipidemia, essential hypertension, ESRD on TTS HD, GERD, migraine and glaucoma presented to the ED following a mechanical fall at home with head injury and left hip pain.  She reportedly was trying to close her window blinds when she turned around, lost her balance, fell hit her head on the windowsill and landed on her left hip.  In the ED, hypoxic to 88% on room air and placed on oxygen.  CT head negative for acute abnormalities.  X-rays demonstrated acute left femoral neck fracture with superior subluxation.  Orthopedics consulted and s/p repair 12/21  Subjective / 24h Interval events: No complaints, seen in dialysis.  Had couple loose stools yesterday, one of them she labeled as an "accident".  She is concerned about it  Assessment & Plan: Principal Problem Acute left hip femoral neck fracture -status post mechanical fall at home, orthopedic surgery consulted and she is s/p right hemiarthroplasty on 12/21 -Initially requesting CIR however her insurance will not cover.  Awaiting SNF placement on Monday  Active Problems Acute hypoxic respiratory failure-related to acute on chronic diastolic CHF, patient on ESRD, she required 2 L on admission, underwent hemodialysis 12/20 -Most recent 2D echo showed an EF 54-62%, grade 2 diastolic dysfunction -Remains on room air and her  respiratory status is stable, she is comfortable  Moderate severe arctic stenosis, moderate severe mitral regurg -watch carefully fluid status postoperatively.  Cardiology consulted and evaluated patient.  C. difficile colonization, loose stools -Patient had diarrhea and has a history of C. difficile.  C. difficile was checked at the beginning of her hospital stay and she was found to be antigen positive but toxin negative.  Abdomen is soft, nontender, her WBC is normal.  She is having loose stools intermittently (2/day) however no large-volume diarrhea.  She is on chronic Keflex for the past 4 years but they usually doesn't give her diarrhea.  C. difficile appears to be a colonizer right now, without a white count, abdominal pain, large-volume diarrhea she does not need treatment.  Minimize protein shakes and laxatives.  Continue probiotics  ESRD on TTS hemodialysis -Underwent dialysis on 12/20.  Nephrology consulted, appreciate input.  Dialysis per nephrology  Paroxysmal A. fib -Patient on Coumadin, she was given vitamin K in preparation for surgery.  Currently she is on sinus, continue amiodarone. -She was bridged back with heparin, heparin now discontinued as her INR has been in the therapeutic range for the past couple of days  Essential hypertension -Blood pressure now normalized  Anemia in the setting of chronic kidney disease/ESRD -Stable, postoperatively her hemoglobin is stable  Leukocytosis -Likely stress margination in the setting of fracture.  Resolved, WBC normal  Chest x-ray abnormality -Per chest x-ray 12/19: Question focal osseous irregularity near the left coracoid process, favored to be secondary to patient positioning. -Requested orthopedics to weigh in, if this needs any further evaluation.  Moderate-sized hiatal hernia/GERD -Does not appear to be on any medications  at home.  Recurrent UTIs -On chronic Keflex for 4 years  Scheduled Meds: . amiodarone  200 mg  Oral Daily  . calcium acetate  1,334 mg Oral TID WC  . cephALEXin  250 mg Oral Daily  . darbepoetin (ARANESP) injection - DIALYSIS  25 mcg Intravenous Once  . latanoprost  1 drop Both Eyes QHS  . multivitamin  1 tablet Oral QHS  . saccharomyces boulardii  250 mg Oral Daily  . timolol  1 drop Both Eyes Daily  . warfarin  2.5 mg Oral Once  . Warfarin - Pharmacist Dosing Inpatient   Does not apply q1600   Continuous Infusions: . sodium chloride    . sodium chloride    . ferric gluconate (FERRLECIT/NULECIT) IV    . ferric gluconate (FERRLECIT/NULECIT) IV    . methocarbamol (ROBAXIN) IV     PRN Meds:.sodium chloride, sodium chloride, acetaminophen, bisacodyl, hydrALAZINE, HYDROmorphone (DILAUDID) injection, lidocaine (PF), lidocaine-prilocaine, loperamide, menthol-cetylpyridinium **OR** phenol, methocarbamol **OR** methocarbamol (ROBAXIN) IV, ondansetron **OR** ondansetron (ZOFRAN) IV, oxyCODONE, oxyCODONE, pentafluoroprop-tetrafluoroeth, polyethylene glycol  Diet Orders (From admission, onward)    Start     Ordered   06/09/20 2153  Diet renal with fluid restriction Fluid restriction: 1200 mL Fluid; Room service appropriate? Yes; Fluid consistency: Thin  Diet effective now       Question Answer Comment  Fluid restriction: 1200 mL Fluid   Room service appropriate? Yes   Fluid consistency: Thin      06/09/20 2152         DVT prophylaxis: SCDs Start: 06/09/20 2153 SCDs Start: 06/07/20 2354    Code Status: Full Code  Family Communication: no family at bedside   Status is: Inpatient  Remains inpatient appropriate because:Inpatient level of care appropriate due to severity of illness  Dispo: The patient is from: Home              Anticipated d/c is to: Home              Anticipated d/c date is: 3 days              Patient currently is medically stable to d/c.  Consultants:  None   Procedures:  None   Microbiology  None   Antimicrobials: None     Objective: Vitals:    06/14/20 0726 06/14/20 0734 06/14/20 0800 06/14/20 0815  BP: 136/62 134/69 (!) (P) 126/51 (!) 126/51  Pulse:      Resp: 13 17  16   Temp:      TempSrc:      SpO2:      Weight:      Height:        Intake/Output Summary (Last 24 hours) at 06/14/2020 0846 Last data filed at 06/13/2020 1500 Gross per 24 hour  Intake 240 ml  Output --  Net 240 ml   Filed Weights   06/12/20 0500 06/14/20 0500 06/14/20 0720  Weight: 82.2 kg 83.6 kg 80.6 kg    Examination:  Constitutional: NAD Eyes: No icterus ENMT: mmm Neck: normal, supple Respiratory: Clear bilaterally, no wheezing Cardiovascular: Regular rate and rhythm, 3/6 SEM, no new murmurs, no edema Abdomen: Soft, NT, ND, bowel sounds positive Musculoskeletal: no clubbing / cyanosis.  Skin: No rashes seen Neurologic: No focal deficits  Data Reviewed: I have independently reviewed following labs and imaging studies   CBC: Recent Labs  Lab 06/07/20 2020 06/07/20 2046 06/11/20 0139 06/11/20 2302 06/12/20 0615 06/13/20 0155 06/14/20 0806  WBC 14.7*   < >  9.7 9.2 9.3 8.0 9.1  NEUTROABS 13.5*  --   --   --   --   --   --   HGB 11.7*   < > 9.7* 9.1* 9.7* 9.8* 9.3*  HCT 38.8   < > 29.6* 29.6* 31.3* 32.0* 31.1*  MCV 98.5   < > 94.0 95.8 94.8 96.7 97.2  PLT 230   < > 233 232 220 217 269   < > = values in this interval not displayed.   Basic Metabolic Panel: Recent Labs  Lab 06/08/20 1456 06/09/20 0410 06/11/20 0139 06/11/20 2302 06/12/20 0615 06/13/20 0155 06/14/20 0803  NA 137   < > 134* 131* 138 135 130*  K 5.5*   < > 3.7 3.6 4.0 4.5 6.2*  CL 94*   < > 95* 90* 98 98 95*  CO2 28   < > 24 26 29 28 23   GLUCOSE 111*   < > 95 113* 117* 87 83  BUN 41*   < > 23 40* 11 24* 42*  CREATININE 6.59*   < > 4.05* 5.64* 2.67* 4.33* 6.17*  CALCIUM 9.0   < > 8.2* 9.1 8.2* 8.8* 9.0  PHOS 5.0*  --   --  3.8  --  2.5 2.8   < > = values in this interval not displayed.   Liver Function Tests: Recent Labs  Lab 06/07/20 2020  06/08/20 1456 06/11/20 2302 06/13/20 0155 06/14/20 0803  AST 21  --   --   --   --   ALT 17  --   --   --   --   ALKPHOS 97  --   --   --   --   BILITOT 1.3*  --   --   --   --   PROT 6.3*  --   --   --   --   ALBUMIN 3.3* 2.9* 2.4* 2.4* 2.3*   Coagulation Profile: Recent Labs  Lab 06/10/20 0243 06/11/20 0742 06/12/20 0615 06/13/20 0155 06/14/20 0103  INR 1.3* 2.0* 2.0* 2.2* 2.3*   HbA1C: No results for input(s): HGBA1C in the last 72 hours. CBG: Recent Labs  Lab 06/08/20 2019  GLUCAP 101*    Recent Results (from the past 240 hour(s))  Resp Panel by RT-PCR (Flu A&B, Covid) Nasopharyngeal Swab     Status: None   Collection Time: 06/07/20  8:43 PM   Specimen: Nasopharyngeal Swab; Nasopharyngeal(NP) swabs in vial transport medium  Result Value Ref Range Status   SARS Coronavirus 2 by RT PCR NEGATIVE NEGATIVE Final    Comment: (NOTE) SARS-CoV-2 target nucleic acids are NOT DETECTED.  The SARS-CoV-2 RNA is generally detectable in upper respiratory specimens during the acute phase of infection. The lowest concentration of SARS-CoV-2 viral copies this assay can detect is 138 copies/mL. A negative result does not preclude SARS-Cov-2 infection and should not be used as the sole basis for treatment or other patient management decisions. A negative result may occur with  improper specimen collection/handling, submission of specimen other than nasopharyngeal swab, presence of viral mutation(s) within the areas targeted by this assay, and inadequate number of viral copies(<138 copies/mL). A negative result must be combined with clinical observations, patient history, and epidemiological information. The expected result is Negative.  Fact Sheet for Patients:  EntrepreneurPulse.com.au  Fact Sheet for Healthcare Providers:  IncredibleEmployment.be  This test is no t yet approved or cleared by the [place] and  has been authorized  for  detection and/or diagnosis of SARS-CoV-2 by FDA under an Emergency Use Authorization (EUA). This EUA will remain  in effect (meaning this test can be used) for the duration of the COVID-19 declaration under Section 564(b)(1) of the Act, 21 U.S.C.section 360bbb-3(b)(1), unless the authorization is terminated  or revoked sooner.       Influenza A by PCR NEGATIVE NEGATIVE Final   Influenza B by PCR NEGATIVE NEGATIVE Final    Comment: (NOTE) The Xpert Xpress SARS-CoV-2/FLU/RSV plus assay is intended as an aid in the diagnosis of influenza from Nasopharyngeal swab specimens and should not be used as a sole basis for treatment. Nasal washings and aspirates are unacceptable for Xpert Xpress SARS-CoV-2/FLU/RSV testing.  Fact Sheet for Patients: EntrepreneurPulse.com.au  Fact Sheet for Healthcare Providers: IncredibleEmployment.be  This test is not yet approved or cleared by the [place] FDA and has been authorized for detection and/or diagnosis of SARS-CoV-2 by FDA under an Emergency Use Authorization (EUA). This EUA will remain in effect (meaning this test can be used) for the duration of the COVID-19 declaration under Section 564(b)(1) of the Act, 21 U.S.C. section 360bbb-3(b)(1), unless the authorization is terminated or revoked.  Performed at [facility], [place] [address] [place], [place] [address]   Urine Culture     Status: Abnormal   Collection Time: 06/09/20  6:00 PM   Specimen: Urine, Catheterized  Result Value Ref Range Status   Specimen Description URINE, CATHETERIZED  Final   Special Requests   Final    NONE Performed at [facility], [place] [address] [place], [place] [address]    Culture MULTIPLE SPECIES PRESENT, SUGGEST RECOLLECTION (A)  Final   Report Status 06/10/2020 FINAL  Final  C Difficile Quick Screen (NO PCR Reflex)     Status: Abnormal   Collection Time: 06/10/20  1:38 PM   Specimen: STOOL  Result  Value Ref Range Status   C Diff antigen POSITIVE (A) NEGATIVE Final   C Diff toxin NEGATIVE NEGATIVE Final   C Diff interpretation   Final    Results are indeterminate. Please contact the provider listed for your campus for C diff questions in [place].    Comment: Performed at [facility], [place] [address] [place], [place] [address]     Radiology Studies: No results found.  Marzetta Board, MD, PhD [facility]  Between 7 am - 7 pm I am available, please contact me via Amion or Securechat  Between 7 pm - 7 am I am not available, please contact night coverage MD/APP via Amion

## 2020-06-14 NOTE — Procedures (Signed)
Patient seen on Hemodialysis. BP (!) 140/50   Pulse 68   Temp (!) 97.5 F (36.4 C) (Oral)   Resp 13   Ht 5\' 2"  (1.575 m)   Wt 80.6 kg   SpO2 96%   BMI 32.50 kg/m   QB 400, UF goal 3L Tolerating treatment without complaints at this time; remains optimistic for DC to SNF soon.  Elmarie Shiley MD [facility]. Office # [phone] Pager # [phone] 10:33 AM

## 2020-06-14 NOTE — Progress Notes (Deleted)
12/25 Pt confused and agitated. Dr. Donnie Mesa notified. Orders given. RN will continue to monitor patient.

## 2020-06-15 ENCOUNTER — Inpatient Hospital Stay (HOSPITAL_COMMUNITY): Payer: Medicare Other

## 2020-06-15 DIAGNOSIS — Z8781 Personal history of (healed) traumatic fracture: Secondary | ICD-10-CM

## 2020-06-15 DIAGNOSIS — Z9889 Other specified postprocedural states: Secondary | ICD-10-CM

## 2020-06-15 DIAGNOSIS — S72002A Fracture of unspecified part of neck of left femur, initial encounter for closed fracture: Secondary | ICD-10-CM | POA: Diagnosis not present

## 2020-06-15 DIAGNOSIS — S72002D Fracture of unspecified part of neck of left femur, subsequent encounter for closed fracture with routine healing: Secondary | ICD-10-CM | POA: Diagnosis not present

## 2020-06-15 DIAGNOSIS — N186 End stage renal disease: Secondary | ICD-10-CM | POA: Diagnosis not present

## 2020-06-15 LAB — CBC
HCT: 30.2 % — ABNORMAL LOW (ref 36.0–46.0)
Hemoglobin: 9.7 g/dL — ABNORMAL LOW (ref 12.0–15.0)
MCH: 30.5 pg (ref 26.0–34.0)
MCHC: 32.1 g/dL (ref 30.0–36.0)
MCV: 95 fL (ref 80.0–100.0)
Platelets: 243 10*3/uL (ref 150–400)
RBC: 3.18 MIL/uL — ABNORMAL LOW (ref 3.87–5.11)
RDW: 16.4 % — ABNORMAL HIGH (ref 11.5–15.5)
WBC: 9.3 10*3/uL (ref 4.0–10.5)
nRBC: 0 % (ref 0.0–0.2)

## 2020-06-15 LAB — PROTIME-INR
INR: 2.4 — ABNORMAL HIGH (ref 0.8–1.2)
Prothrombin Time: 25.1 seconds — ABNORMAL HIGH (ref 11.4–15.2)

## 2020-06-15 LAB — BASIC METABOLIC PANEL
Anion gap: 12 (ref 5–15)
BUN: 20 mg/dL (ref 8–23)
CO2: 28 mmol/L (ref 22–32)
Calcium: 8.6 mg/dL — ABNORMAL LOW (ref 8.9–10.3)
Chloride: 96 mmol/L — ABNORMAL LOW (ref 98–111)
Creatinine, Ser: 4.13 mg/dL — ABNORMAL HIGH (ref 0.44–1.00)
GFR, Estimated: 11 mL/min — ABNORMAL LOW (ref >60)
Glucose, Bld: 87 mg/dL (ref 70–99)
Potassium: 3.5 mmol/L (ref 3.5–5.1)
Sodium: 136 mmol/L (ref 135–145)

## 2020-06-15 MED ORDER — WARFARIN SODIUM 5 MG PO TABS
5.0000 mg | ORAL_TABLET | ORAL | Status: DC
  Administered 2020-06-16 – 2020-06-18 (×2): 5 mg via ORAL
  Filled 2020-06-15 (×2): qty 1

## 2020-06-15 MED ORDER — WARFARIN SODIUM 2.5 MG PO TABS
2.5000 mg | ORAL_TABLET | ORAL | Status: DC
  Administered 2020-06-15 – 2020-06-17 (×2): 2.5 mg via ORAL
  Filled 2020-06-15 (×2): qty 1

## 2020-06-15 MED ORDER — NEPRO/CARBSTEADY PO LIQD
237.0000 mL | Freq: Two times a day (BID) | ORAL | Status: DC
  Administered 2020-06-15 – 2020-06-20 (×6): 237 mL via ORAL

## 2020-06-15 NOTE — Plan of Care (Signed)
  Problem: Clinical Measurements: Goal: Ability to maintain clinical measurements within normal limits will improve Outcome: Progressing Goal: Will remain free from infection Outcome: Progressing   Problem: Elimination: Goal: Will not experience complications related to bowel motility Outcome: Progressing Goal: Will not experience complications related to urinary retention Outcome: Progressing

## 2020-06-15 NOTE — Progress Notes (Signed)
[facility]  PROGRESS NOTE  [name] IWL:[id number] DOB: 11-Jul-1945 DOA: 06/07/2020 PCP: No primary care provider on file.   Brief HPI:   74 year old female with history of dilated cardiomyopathy, EF of 20 to 25%, in setting of sepsis in 1740 complicated by demand ischemia, cardiac catheterization in 2014 showed normal coronaries, chronic diastolic CHF, PAF on Coumadin anticoagulation, moderate to severe aortic stenosis, tolerated TKR in/21 per cardiology may need consideration of AVR at some point, hyperlipidemia, essential hypertension, ESRD on TTS hemodialysis, GERD, migraine, glaucoma presented to the ED following mechanical fall at home with head injury and left hip pain.  In the ED she was found to be hypoxemic with O2 sats 82% on room air and placed on oxygen.  CT head was negative for acute abnormality.  X-ray of the hip showed acute left femoral neck fracture with superior subluxation.  Orthopedics was consulted and underwent repair on 06/09/2020.    Subjective   Patient seen and examined, denies any complaints.  She does have diarrhea however it is improving.   Assessment/Plan:     1. Left femoral neck fracture-s/p left hemiarthroplasty on 06/09/2020.  Initially requested CIR however insurance will not cover.  Awaiting skilled nursing facility placement. 2. Acute hypoxemic respiratory failure-secondary to acute on chronic diastolic CHF, patient on ESRD, required 2 L of oxygen, underwent hemodialysis 06/08/2020.  Most recent echo showed EF of 55 to 81%, grade 2 diastolic dysfunction. 3. C. difficile colonization, loose stool-patient has history of C. difficile, C. difficile PCR was checked however was found to have positive antigen but PCR toxin was negative.  Abdomen is soft, nontender, WBC is normal.  She is having intermittent loose stools.  She has been on chronic Keflex for past 4 years.  At this time C. difficile appears to be a colonizer without active infection.   Continue probiotics. 4. ESRD on TTS hemodialysis-patient underwent dialysis on 06/08/2020, nephrology has been consulted.  Dialysis per nephrology. 5. Paroxysmal atrial fibrillation-patient has history of possible atrial fibrillation, she received vitamin K in preparation of surgery for supratherapeutic INR.  She was bridged back with heparin, heparin has discontinued, INR is in therapeutic range. 6. Hypertension-blood pressure is stable 7. Focal osseous irregularity-seen on the chest x-ray.  There is a focal osseous irregularity near the left coracoid process, could be from patient positioning.  Dedicated x-ray of left shoulder recommended.  Will obtain x-ray of left shoulder. 8. Recurrent UTIs-continue Keflex, patient has been on Keflex for past 4 years.     COVID-19 Labs  No results for input(s): DDIMER, FERRITIN, LDH, CRP in the last 72 hours.  Lab Results  Component Value Date   [place] NEGATIVE 06/07/2020     Scheduled medications:   . amiodarone  200 mg Oral Daily  . calcium acetate  667 mg Oral TID WC  . cephALEXin  250 mg Oral Daily  . feeding supplement (NEPRO CARB STEADY)  237 mL Oral BID BM  . latanoprost  1 drop Both Eyes QHS  . multivitamin  1 tablet Oral QHS  . saccharomyces boulardii  250 mg Oral Daily  . timolol  1 drop Both Eyes Daily  . warfarin  2.5 mg Oral Once per day on Sun Mon Wed Fri Sat   And  . [START ON 06/16/2020] warfarin  5 mg Oral Once per day on Tue Thu  . Warfarin - Pharmacist Dosing Inpatient   Does not apply q1600         CBG: Recent  Labs  Lab 06/08/20 2019  GLUCAP 101*    SpO2: 97 % O2 Flow Rate (L/min): 2 L/min    CBC: Recent Labs  Lab 06/11/20 2302 06/12/20 0615 06/13/20 0155 06/14/20 0806 06/15/20 0308  WBC 9.2 9.3 8.0 9.1 9.3  HGB 9.1* 9.7* 9.8* 9.3* 9.7*  HCT 29.6* 31.3* 32.0* 31.1* 30.2*  MCV 95.8 94.8 96.7 97.2 95.0  PLT 232 220 217 269 811    Basic Metabolic Panel: Recent Labs  Lab 06/11/20 2302  06/12/20 0615 06/13/20 0155 06/14/20 0803 06/15/20 0308  NA 131* 138 135 130* 136  K 3.6 4.0 4.5 6.2* 3.5  CL 90* 98 98 95* 96*  CO2 26 29 28 23 28   GLUCOSE 113* 117* 87 83 87  BUN 40* 11 24* 42* 20  CREATININE 5.64* 2.67* 4.33* 6.17* 4.13*  CALCIUM 9.1 8.2* 8.8* 9.0 8.6*  PHOS 3.8  --  2.5 2.8  --      Liver Function Tests: Recent Labs  Lab 06/11/20 2302 06/13/20 0155 06/14/20 0803  ALBUMIN 2.4* 2.4* 2.3*     Antibiotics: Anti-infectives (From admission, onward)   Start     Dose/Rate Route Frequency Ordered Stop   06/11/20 1000  cephALEXin (KEFLEX) capsule 250 mg        250 mg Oral Daily 06/09/20 2154     06/09/20 2245  ceFAZolin (ANCEF) IVPB 2g/100 mL premix        2 g 200 mL/hr over 30 Minutes Intravenous Every 6 hours 06/09/20 2152 06/10/20 0550   06/09/20 1630  ceFAZolin (ANCEF) IVPB 2g/100 mL premix        2 g 200 mL/hr over 30 Minutes Intravenous On call to O.R. 06/09/20 1616 06/09/20 1744   06/09/20 1619  ceFAZolin (ANCEF) 2-4 GM/100ML-% IVPB       Note to Pharmacy: [name]   : cabinet override      06/09/20 1619 06/09/20 1746   06/08/20 1245  cephALEXin (KEFLEX) capsule 250 mg  Status:  Discontinued        250 mg Oral Daily 06/08/20 1241 06/09/20 2154       DVT prophylaxis: SCDs  Code Status: Full code  Family Communication: No family at bedside   Consultants:  None  Procedures:  Hemiarthroplasty left hip    Objective   Vitals:   06/15/20 0420 06/15/20 0500 06/15/20 0836 06/15/20 1300  BP: (!) 141/61  140/62 (!) 142/66  Pulse: 73  71 73  Resp: 15  16 16   Temp: (!) 97.5 F (36.4 C)  98.2 F (36.8 C) 98.3 F (36.8 C)  TempSrc: Oral  Oral Oral  SpO2: 95%  96% 97%  Weight:  84.2 kg    Height:        Intake/Output Summary (Last 24 hours) at 06/15/2020 1514 Last data filed at 06/15/2020 0900 Gross per 24 hour  Intake 350 ml  Output 500 ml  Net -150 ml    12/25 1901 - 12/27 0700 In: 110  Out: 1961 [Urine:500]  Filed  Weights   06/14/20 0500 06/14/20 0720 06/15/20 0500  Weight: 83.6 kg 80.6 kg 84.2 kg    Physical Examination:    General-appears in no acute distress  Heart-S1-S2, regular, no murmur auscultated  Lungs-clear to auscultation bilaterally, no wheezing or crackles auscultated  Abdomen-soft, nontender, no organomegaly  Extremities-no edema in the lower extremities  Neuro-alert, oriented x3, no focal deficit noted   Status is: Inpatient  Dispo: The patient is from: Home  Anticipated d/c is to: Skilled nursing facility              Anticipated d/c date is: 06/16/2020              Patient currently medically stable for discharge  Barrier to discharge-awaiting bed at skilled nursing facility            Data Reviewed:   Recent Results (from the past 240 hour(s))  Resp Panel by RT-PCR (Flu A&B, Covid) Nasopharyngeal Swab     Status: None   Collection Time: 06/07/20  8:43 PM   Specimen: Nasopharyngeal Swab; Nasopharyngeal(NP) swabs in vial transport medium  Result Value Ref Range Status   SARS Coronavirus 2 by RT PCR NEGATIVE NEGATIVE Final    Comment: (NOTE) SARS-CoV-2 target nucleic acids are NOT DETECTED.  The SARS-CoV-2 RNA is generally detectable in upper respiratory specimens during the acute phase of infection. The lowest concentration of SARS-CoV-2 viral copies this assay can detect is 138 copies/mL. A negative result does not preclude SARS-Cov-2 infection and should not be used as the sole basis for treatment or other patient management decisions. A negative result may occur with  improper specimen collection/handling, submission of specimen other than nasopharyngeal swab, presence of viral mutation(s) within the areas targeted by this assay, and inadequate number of viral copies(<138 copies/mL). A negative result must be combined with clinical observations, patient history, and epidemiological information. The expected result is  Negative.  Fact Sheet for Patients:  EntrepreneurPulse.com.au  Fact Sheet for Healthcare Providers:  IncredibleEmployment.be  This test is no t yet approved or cleared by the [place] FDA and  has been authorized for detection and/or diagnosis of SARS-CoV-2 by FDA under an Emergency Use Authorization (EUA). This EUA will remain  in effect (meaning this test can be used) for the duration of the COVID-19 declaration under Section 564(b)(1) of the Act, 21 U.S.C.section 360bbb-3(b)(1), unless the authorization is terminated  or revoked sooner.       Influenza A by PCR NEGATIVE NEGATIVE Final   Influenza B by PCR NEGATIVE NEGATIVE Final    Comment: (NOTE) The Xpert Xpress SARS-CoV-2/FLU/RSV plus assay is intended as an aid in the diagnosis of influenza from Nasopharyngeal swab specimens and should not be used as a sole basis for treatment. Nasal washings and aspirates are unacceptable for Xpert Xpress SARS-CoV-2/FLU/RSV testing.  Fact Sheet for Patients: EntrepreneurPulse.com.au  Fact Sheet for Healthcare Providers: IncredibleEmployment.be  This test is not yet approved or cleared by the [place] FDA and has been authorized for detection and/or diagnosis of SARS-CoV-2 by FDA under an Emergency Use Authorization (EUA). This EUA will remain in effect (meaning this test can be used) for the duration of the COVID-19 declaration under Section 564(b)(1) of the Act, 21 U.S.C. section 360bbb-3(b)(1), unless the authorization is terminated or revoked.  Performed at [facility], [place] [address], [place], [place] [address]   Urine Culture     Status: Abnormal   Collection Time: 06/09/20  6:00 PM   Specimen: Urine, Catheterized  Result Value Ref Range Status   Specimen Description URINE, CATHETERIZED  Final   Special Requests   Final    NONE Performed at [facility], [place] [address]  [place], [place] [address]    Culture MULTIPLE SPECIES PRESENT, SUGGEST RECOLLECTION (A)  Final   Report Status 06/10/2020 FINAL  Final  C Difficile Quick Screen (NO PCR Reflex)     Status: Abnormal   Collection Time:  06/10/20  1:38 PM   Specimen: STOOL  Result Value Ref Range Status   C Diff antigen POSITIVE (A) NEGATIVE Final   C Diff toxin NEGATIVE NEGATIVE Final   C Diff interpretation   Final    Results are indeterminate. Please contact the provider listed for your campus for C diff questions in [place].    Comment: Performed at [facility], [place] [address], [place], [place] [address]    No results for input(s): LIPASE, AMYLASE in the last 168 hours. No results for input(s): AMMONIA in the last 168 hours.  Cardiac Enzymes: No results for input(s): CKTOTAL, CKMB, CKMBINDEX, TROPONINI in the last 168 hours. BNP (last 3 results) Recent Labs    06/08/20 0525  BNP 2,447.3*      Oswald Hillock   [facility] If 7PM-7AM, please contact night-coverage at [email or web address], Office  [phone]   06/15/2020, 3:14 PM  LOS: 8 days

## 2020-06-15 NOTE — Progress Notes (Signed)
Physical Therapy Treatment Patient Details Name: [name] MRN: [id number] DOB: 1945-08-06 Today's Date: 06/15/2020    History of Present Illness Pt is a 74 y.o. female admitted 06/07/20 after fall sustaining L hip femoral neck fx. S/p posterior hemiarthroplasty 12/21. PMH includes ESRD (HD TTS), CHF, PAF, HTN, L TKA (10/2019).    PT Comments    Patient limited this session by Lt hip pain so gait limited to short distance in room. Pt required cues to recall all posterior hip precautions and assist to manage position of walker during gait initially, improved towards end of gait. Min gaurd/assist provided for pt to complete self peri-care before donning clean brief, no purewick in the room. Patient also completed exercise for Lt quad strengthening and ice provided for pain management. Acute PT will continue to progress pt as able. SNF follow up remains appropriate.   Follow Up Recommendations  SNF;Supervision for mobility/OOB     Equipment Recommendations  3in1 (PT);Rolling walker with 5" wheels    Recommendations for Other Services       Precautions / Restrictions Precautions Precautions: Fall;Posterior Hip Precaution Booklet Issued: Yes (comment) Precaution Comments: pt able to state 2/3 precautions, required cues for no internal rotation, reviewed handout Restrictions Weight Bearing Restrictions: Yes LLE Weight Bearing: Weight bearing as tolerated    Mobility  Bed Mobility Overal bed mobility: Needs Assistance Bed Mobility: Supine to Sit     Supine to sit: Min assist;HOB elevated     General bed mobility comments: pt taking extra time to move to EOB and assist required for Lt LE. pt able to scoot anteriorly at EOB.  Transfers Overall transfer level: Needs assistance Equipment used: Rolling walker (2 wheeled) Transfers: Sit to/from Stand Sit to Stand: Min assist         General transfer comment: cues to maintain posterior hip precautions. pt requires bil UE for  power up from EOB/chair with min assist needed to steady during rise/hand transition to walker.  Ambulation/Gait Ambulation/Gait assistance: Min assist Gait Distance (Feet): 16 Feet Assistive device: Rolling walker (2 wheeled) Gait Pattern/deviations: Step-to pattern;Decreased stride length;Decreased step length - left;Decreased step length - right;Decreased weight shift to left;Antalgic Gait velocity: decr   General Gait Details: cues for step pattern and position to RW, min assist to manage walker to turn. pt ambulated short distance in room due to being more limited by Lt hip pain.   Stairs             Wheelchair Mobility    Modified Rankin (Stroke Patients Only)       Balance Overall balance assessment: Needs assistance Sitting-balance support: No upper extremity supported;Feet supported;Bilateral upper extremity supported Sitting balance-Leahy Scale: Fair     Standing balance support: During functional activity;Bilateral upper extremity supported Standing balance-Leahy Scale: Poor                              Cognition Arousal/Alertness: Awake/alert Behavior During Therapy: WFL for tasks assessed/performed Overall Cognitive Status: Within Functional Limits for tasks assessed                                        Exercises General Exercises - Lower Extremity Long Arc Quad: AROM;Both;Seated;15 reps    General Comments        Pertinent Vitals/Pain Pain Assessment: Faces Faces Pain Scale: Hurts even more  Pain Location: L hip Pain Descriptors / Indicators: Discomfort;Sore Pain Intervention(s): Limited activity within patient's tolerance;Monitored during session;Repositioned;Ice applied    Home Living                      Prior Function            PT Goals (current goals can now be found in the care plan section) Acute Rehab PT Goals Patient Stated Goal: To get stronger and get back to independence PT Goal  Formulation: With patient/family Time For Goal Achievement: 06/24/20 Potential to Achieve Goals: Good Progress towards PT goals: Progressing toward goals    Frequency    Min 3X/week      PT Plan Current plan remains appropriate    Co-evaluation              AM-PAC PT "6 Clicks" Mobility   Outcome Measure  Help needed turning from your back to your side while in a flat bed without using bedrails?: A Little Help needed moving from lying on your back to sitting on the side of a flat bed without using bedrails?: A Little Help needed moving to and from a bed to a chair (including a wheelchair)?: A Little Help needed standing up from a chair using your arms (e.g., wheelchair or bedside chair)?: A Little Help needed to walk in hospital room?: A Little Help needed climbing 3-5 steps with a railing? : A Lot 6 Click Score: 17    End of Session Equipment Utilized During Treatment: Gait belt Activity Tolerance: Patient tolerated treatment well Patient left: in chair;with call bell/phone within reach;with chair alarm set Nurse Communication: Mobility status PT Visit Diagnosis: Unsteadiness on feet (R26.81);Muscle weakness (generalized) (M62.81);Pain Pain - Right/Left: Left Pain - part of body: Hip     Time: 8938-1017 PT Time Calculation (min) (ACUTE ONLY): 35 min  Charges:  $Gait Training: 8-22 mins $Therapeutic Exercise: 8-22 mins                     Verner Mould, DPT Acute Rehabilitation Services Office [phone] Pager [phone]     [name] 06/15/2020, 1:51 PM

## 2020-06-15 NOTE — Progress Notes (Signed)
[facility] for warfarin  Indication:  Atrial fibrillation  Allergies  Allergen Reactions  . Percocet [Oxycodone-Acetaminophen] Nausea And Vomiting  . Vicodin [Hydrocodone-Acetaminophen] Nausea And Vomiting  . Chlorhexidine Rash    Pt states she gets rash similar to "sunburn"    Patient Measurements: Height: 5\' 2"  (157.5 cm) Weight: 84.2 kg (185 lb 10 oz) IBW/kg (Calculated) : 50.1  Vital Signs: Temp: 98.2 F (36.8 C) (12/27 0836) Temp Source: Oral (12/27 0836) BP: 140/62 (12/27 0836) Pulse Rate: 71 (12/27 0836)  Labs: Recent Labs    06/12/20 1241 06/13/20 0155 06/13/20 0155 06/14/20 0103 06/14/20 0803 06/14/20 0806 06/15/20 0308  HGB  --  9.8*   < >  --   --  9.3* 9.7*  HCT  --  32.0*  --   --   --  31.1* 30.2*  PLT  --  217  --   --   --  269 243  LABPROT  --  23.3*  --  24.1*  --   --  25.1*  INR  --  2.2*  --  2.3*  --   --  2.4*  HEPARINUNFRC 0.59 0.41  --   --   --   --   --   CREATININE  --  4.33*  --   --  6.17*  --  4.13*   < > = values in this interval not displayed.    Estimated Creatinine Clearance: 11.8 mL/min (A) (by C-G formula based on SCr of 4.13 mg/dL (H)).   Medical History: History reviewed. No pertinent past medical history.  Assessment: 74 yr old female admitted on 06/07/20 with L femoral neck fracture; pt is S/P hemiarthroplasty.  Patient was taking warfarin at home prior to admission for atrial fibrillation (5 mg on Tues/Thurs, 2.5 mg on other days).  Warfarin was held for surgery, and pt was given vitamin K 5 mg IV X 1 on 12/19. Patient transitioned to heparin infusion in preparation for surgery. Heparin infusion now off and warfarin resumed 12/24  INR remains in goal range this morning. H/H stable with no bleeding noted.  On amiodarone PTA, which is continued during this admission.   Goal of Therapy:  INR 2-3   Monitor platelets by anticoagulation protocol: Yes   Plan:  - Warfarin 2.5 mg PO  daily except 5 mg on Tue/Thu per home dosing - Change INR to MWF - Monitor for signs/symptoms of bleeding   Thank you for involving pharmacy in this patient's care.  Renold Genta, PharmD, BCPS Clinical Pharmacist Clinical phone for 06/15/2020 until 3p is [phone] 06/15/2020 10:26 AM  **Pharmacist phone directory can be found on [email or web address] listed under [place]**

## 2020-06-15 NOTE — Progress Notes (Signed)
[name] Progress Note   Subjective:  Seen in room. Still with hip pain, but otherwise ok. No overnight CP/dyspnea. Did fine with HD yesterday.   Objective Vitals:   06/14/20 1946 06/15/20 0420 06/15/20 0500 06/15/20 0836  BP: (!) 140/50 (!) 141/61  140/62  Pulse: 73 73  71  Resp: 16 15  16   Temp: 98.8 F (37.1 C) (!) 97.5 F (36.4 C)  98.2 F (36.8 C)  TempSrc: Oral Oral  Oral  SpO2: 92% 95%  96%  Weight:   84.2 kg   Height:       Physical Exam General: Well appearing woman, NAD. Room air. Heart: RRR; 2/6 murmur Lungs: CTA anteriorly Abdomen: soft Extremities: No LE edema; SCDs in place Dialysis Access: L AVF + thrill  Additional Objective Labs: Basic Metabolic Panel: Recent Labs  Lab 06/11/20 2302 06/12/20 0615 06/13/20 0155 06/14/20 0803 06/15/20 0308  NA 131*   < > 135 130* 136  K 3.6   < > 4.5 6.2* 3.5  CL 90*   < > 98 95* 96*  CO2 26   < > 28 23 28   GLUCOSE 113*   < > 87 83 87  BUN 40*   < > 24* 42* 20  CREATININE 5.64*   < > 4.33* 6.17* 4.13*  CALCIUM 9.1   < > 8.8* 9.0 8.6*  PHOS 3.8  --  2.5 2.8  --    < > = values in this interval not displayed.   Liver Function Tests: Recent Labs  Lab 06/11/20 2302 06/13/20 0155 06/14/20 0803  ALBUMIN 2.4* 2.4* 2.3*   CBC: Recent Labs  Lab 06/11/20 2302 06/12/20 0615 06/13/20 0155 06/14/20 0806 06/15/20 0308  WBC 9.2 9.3 8.0 9.1 9.3  HGB 9.1* 9.7* 9.8* 9.3* 9.7*  HCT 29.6* 31.3* 32.0* 31.1* 30.2*  MCV 95.8 94.8 96.7 97.2 95.0  PLT 232 220 217 269 243   Medications: . ferric gluconate (FERRLECIT/NULECIT) IV 125 mg (06/14/20 1007)  . methocarbamol (ROBAXIN) IV     . amiodarone  200 mg Oral Daily  . calcium acetate  667 mg Oral TID WC  . cephALEXin  250 mg Oral Daily  . latanoprost  1 drop Both Eyes QHS  . multivitamin  1 tablet Oral QHS  . saccharomyces boulardii  250 mg Oral Daily  . timolol  1 drop Both Eyes Daily  . warfarin  2.5 mg Oral Once per day on Sun Mon Wed Fri  Sat   And  . [START ON 06/16/2020] warfarin  5 mg Oral Once per day on Tue Thu  . Warfarin - Pharmacist Dosing Inpatient   Does not apply q1600    Dialysis Orders: TTS @ [facility] 4 hrs 180NRe 400/800 77.5 kg 2.0 K/ 2.0 Ca UFP 4 AVF Heparin 1000 units IV TIW - Mircera 150 mcg IV q 2 weeks (last dose 05/28/2020 last HGB 10.6 06/04/2020) - Venofer 100 mg IV X 10 doses (1/10 doses given) Tsat 19 05/28/2020.  Assessment/Plan: 1. L Hip fracture s/p fall: S/p L hip hemiarthroplasty12/21/2021. Per primary/Orthopedics.  2. Acute on chronic diastolic HF- Last SJ62 to 55% 06/08/2020.Hadevidence of mild volume overload by exam/CXR 12/20.Volume excess improved post HD.  3. ESRD: Continue HD per TTS schedule. Issues with cramping. Lowered machine temp-change to UFP 2 which has helped.Please continue these orders on DC.  4. Hypertension - BP controlled without meds. No edema. 5. Anemia: Hgb 9.7 - Aranesp 25 mcg given 12/26 - follow for  further need.She is finishing course of IV iron. 6. Metabolic bone disease: CorrCa ok, Phos was low - Phoslo dose reduced to 1/meals. 7. Nutrition - Albumin low, restarting Nepro. 8. PAF-on coumadin. Management per primary/pharmacy.  9. Prolonged QTc in setting of RBBB-no benadryl or agents that could increase QTc   Amy Penton, PA-C 06/15/2020, 10:40 AM  [employer]

## 2020-06-16 DIAGNOSIS — I4891 Unspecified atrial fibrillation: Secondary | ICD-10-CM

## 2020-06-16 DIAGNOSIS — J9601 Acute respiratory failure with hypoxia: Secondary | ICD-10-CM | POA: Diagnosis not present

## 2020-06-16 DIAGNOSIS — N186 End stage renal disease: Secondary | ICD-10-CM | POA: Diagnosis not present

## 2020-06-16 DIAGNOSIS — S72002D Fracture of unspecified part of neck of left femur, subsequent encounter for closed fracture with routine healing: Secondary | ICD-10-CM | POA: Diagnosis not present

## 2020-06-16 LAB — RENAL FUNCTION PANEL
Albumin: 2.4 g/dL — ABNORMAL LOW (ref 3.5–5.0)
Anion gap: 12 (ref 5–15)
BUN: 37 mg/dL — ABNORMAL HIGH (ref 8–23)
CO2: 25 mmol/L (ref 22–32)
Calcium: 8.8 mg/dL — ABNORMAL LOW (ref 8.9–10.3)
Chloride: 94 mmol/L — ABNORMAL LOW (ref 98–111)
Creatinine, Ser: 6.19 mg/dL — ABNORMAL HIGH (ref 0.44–1.00)
GFR, Estimated: 7 mL/min — ABNORMAL LOW (ref >60)
Glucose, Bld: 94 mg/dL (ref 70–99)
Phosphorus: 3.2 mg/dL (ref 2.5–4.6)
Potassium: 3.9 mmol/L (ref 3.5–5.1)
Sodium: 131 mmol/L — ABNORMAL LOW (ref 135–145)

## 2020-06-16 LAB — CBC
HCT: 35.3 % — ABNORMAL LOW (ref 36.0–46.0)
Hemoglobin: 10.7 g/dL — ABNORMAL LOW (ref 12.0–15.0)
MCH: 29.5 pg (ref 26.0–34.0)
MCHC: 30.3 g/dL (ref 30.0–36.0)
MCV: 97.2 fL (ref 80.0–100.0)
Platelets: 303 10*3/uL (ref 150–400)
RBC: 3.63 MIL/uL — ABNORMAL LOW (ref 3.87–5.11)
RDW: 16.5 % — ABNORMAL HIGH (ref 11.5–15.5)
WBC: 10.8 10*3/uL — ABNORMAL HIGH (ref 4.0–10.5)
nRBC: 0 % (ref 0.0–0.2)

## 2020-06-16 LAB — CREATININE, SERUM
Creatinine, Ser: 5.9 mg/dL — ABNORMAL HIGH (ref 0.44–1.00)
GFR, Estimated: 7 mL/min — ABNORMAL LOW (ref >60)

## 2020-06-16 NOTE — Progress Notes (Signed)
Per attending MD, pt not on enteric precautions.

## 2020-06-16 NOTE — Progress Notes (Signed)
     Subjective: 7 Days Post-Op s/p Procedure(s): ARTHROPLASTY BIPOLAR HIP (HEMIARTHROPLASTY)  Reports pain as mild this morning. States she was able to get a good night sleep last night. No new paraesthesias, nausea, vomiting.   Objective:  PE: VITALS:   Vitals:   06/15/20 1300 06/15/20 2021 06/16/20 0427 06/16/20 0900  BP: (!) 142/66 (!) 140/52 (!) 141/60 139/62  Pulse: 73 74 69 65  Resp: 16 16 16 16   Temp: 98.3 F (36.8 C) 98 F (36.7 C) 97.6 F (36.4 C) 98 F (36.7 C)  TempSrc: Oral Oral Oral Oral  SpO2: 97% 93% 96% 97%  Weight:      Height:        General: alert, oriented, laying in bed, in no acute distress GI: abdomen soft, nontender MSK: LLE - dorsiflexion and plantarflexion intact. Able to move all toes. Distal sensation intact. Foot warm and well perfused. Mild TTP to left hip. Dressing CDI.No ecchymosis.   LABS  Results for orders placed or performed during the hospital encounter of 06/07/20 (from the past 24 hour(s))  Creatinine, serum     Status: Abnormal   Collection Time: 06/16/20  4:50 AM  Result Value Ref Range   Creatinine, Ser 5.90 (H) 0.44 - 1.00 mg/dL   GFR, Estimated 7 (L) >60 mL/min    DG Shoulder Left  Result Date: 06/15/2020 CLINICAL DATA:  Coracoid impingement EXAM: LEFT SHOULDER - 2+ VIEW COMPARISON:  06/07/2020 FINDINGS: Internal rotation, external rotation, and transscapular views of the left shoulder are obtained. No fracture, subluxation, or dislocation. The density seen at the base of the coracoid on prior study simply reflects trabecular markings, with no underlying bony abnormality. Joint spaces are well preserved. Left chest is clear. IMPRESSION: 1. Unremarkable left shoulder. Electronically Signed   By: Randa Ngo M.D.   On: 06/15/2020 17:51    Assessment/Plan: Principal Problem:   Hip fracture (HCC) Active Problems:   Leukocytosis   Acute hypoxemic respiratory failure (HCC)   Atrial fibrillation (HCC)   ESRD (end stage  renal disease) (HCC)  Left femoral neck fracture: 7 Days Post-Op s/p Procedure(s): ARTHROPLASTY BIPOLAR HIP (HEMIARTHROPLASTY)  Weightbearing: WBAT LLE, up with therapy Insicional and dressing care: Changed dressing this AM, Reinforce dressings as needed VTE prophylaxis: bridged back to home coumadin Pain control: continue current regimen Follow - up plan: 2 weeks with Dr. Mardelle Matte Dispo: pending SNF placement, ok to discharge from orthopedic standpoint. Will continue to follow along intermittently.  Repeat shoulder x-rays taken, these were negative. Patient denies any pain or loss of range of motion at left shoulder.  Contact information:   Weekdays 8-5 [address], [place] [phone] A fter hours and holidays please check [email or web address] for group call information for [facility]  Ventura Bruns 06/16/2020, 9:10 AM

## 2020-06-16 NOTE — Progress Notes (Signed)
[facility]  PROGRESS NOTE  [name] SKA:[id number] DOB: 08/06/1945 DOA: 06/07/2020 PCP: No primary care provider on file.   Brief HPI:   74 year old female with history of dilated cardiomyopathy, EF of 20 to 25%, in setting of sepsis in 2035 complicated by demand ischemia, cardiac catheterization in 2014 showed normal coronaries, chronic diastolic CHF, PAF on Coumadin anticoagulation, moderate to severe aortic stenosis, tolerated TKR in/21 per cardiology may need consideration of AVR at some point, hyperlipidemia, essential hypertension, ESRD on TTS hemodialysis, GERD, migraine, glaucoma presented to the ED following mechanical fall at home with head injury and left hip pain.  In the ED she was found to be hypoxemic with O2 sats 82% on room air and placed on oxygen.  CT head was negative for acute abnormality.  X-ray of the hip showed acute left femoral neck fracture with superior subluxation.  Orthopedics was consulted and underwent repair on 06/09/2020.   Subjective   Patient seen and examined. She is complaining of hip pain which is ongoing but other than that says she is doing okay overall. She previously had diarrhea but now that has resolved. Denies having any abdominal pain.   Assessment/Plan:   1. Left femoral neck fracture-s/p left hemiarthroplasty on 06/09/2020.  Initially requested CIR however insurance will not cover.  Awaiting skilled nursing facility placement. 2. Acute hypoxemic respiratory failure-secondary to acute on chronic diastolic CHF, patient on ESRD, required 2 L of oxygen, hemodialysis this afternoon.  Most recent echo showed EF of 55 to 59%, grade 2 diastolic dysfunction. 3. C. difficile colonization, loose stool-patient has history of C. difficile, C. difficile PCR was checked however was found to have positive antigen but PCR toxin was negative.  Abdomen is soft, nontender, WBC is normal. She was having intermittent loose stools. Now resolved per patient. She  has been on chronic Keflex for past 4 years.  At this time C. difficile appears to be a colonizer without active infection.  Continue probiotics. 4. ESRD on TTS hemodialysis. Nephrology has been consulted.  Dialysis per nephrology. 5. Paroxysmal atrial fibrillation-patient has history of possible atrial fibrillation, she received vitamin K in preparation of surgery for supratherapeutic INR.  She was bridged back with heparin, heparin has discontinued, INR is in therapeutic range. 6. Hypertension-blood pressure is stable 7. Focal osseous irregularity-seen on the chest x-ray.  There is a focal osseous irregularity near the left coracoid process, could be from patient positioning. X-ray of the left shoulder unremarkable per report. 8. Recurrent UTIs-continue Keflex, patient has been on Keflex for past 4 years.   COVID-19 Labs  No results for input(s): DDIMER, FERRITIN, LDH, CRP in the last 72 hours.  Lab Results  Component Value Date   [place] NEGATIVE 06/07/2020     Scheduled medications:   . amiodarone  200 mg Oral Daily  . calcium acetate  667 mg Oral TID WC  . cephALEXin  250 mg Oral Daily  . feeding supplement (NEPRO CARB STEADY)  237 mL Oral BID BM  . latanoprost  1 drop Both Eyes QHS  . multivitamin  1 tablet Oral QHS  . saccharomyces boulardii  250 mg Oral Daily  . timolol  1 drop Both Eyes Daily  . warfarin  2.5 mg Oral Once per day on Sun Mon Wed Fri Sat   And  . warfarin  5 mg Oral Once per day on Tue Thu  . Warfarin - Pharmacist Dosing Inpatient   Does not apply q1600    CBG: No  results for input(s): GLUCAP in the last 168 hours.  SpO2: 96 % O2 Flow Rate (L/min): 2 L/min    CBC: Recent Labs  Lab 06/11/20 2302 06/12/20 0615 06/13/20 0155 06/14/20 0806 06/15/20 0308  WBC 9.2 9.3 8.0 9.1 9.3  HGB 9.1* 9.7* 9.8* 9.3* 9.7*  HCT 29.6* 31.3* 32.0* 31.1* 30.2*  MCV 95.8 94.8 96.7 97.2 95.0  PLT 232 220 217 269 341    Basic Metabolic Panel: Recent Labs   Lab 06/11/20 2302 06/12/20 0615 06/13/20 0155 06/14/20 0803 06/15/20 0308 06/16/20 0450  NA 131* 138 135 130* 136  --   K 3.6 4.0 4.5 6.2* 3.5  --   CL 90* 98 98 95* 96*  --   CO2 26 29 28 23 28   --   GLUCOSE 113* 117* 87 83 87  --   BUN 40* 11 24* 42* 20  --   CREATININE 5.64* 2.67* 4.33* 6.17* 4.13* 5.90*  CALCIUM 9.1 8.2* 8.8* 9.0 8.6*  --   PHOS 3.8  --  2.5 2.8  --   --      Liver Function Tests: Recent Labs  Lab 06/11/20 2302 06/13/20 0155 06/14/20 0803  ALBUMIN 2.4* 2.4* 2.3*     Antibiotics: Anti-infectives (From admission, onward)   Start     Dose/Rate Route Frequency Ordered Stop   06/11/20 1000  cephALEXin (KEFLEX) capsule 250 mg        250 mg Oral Daily 06/09/20 2154     06/09/20 2245  ceFAZolin (ANCEF) IVPB 2g/100 mL premix        2 g 200 mL/hr over 30 Minutes Intravenous Every 6 hours 06/09/20 2152 06/10/20 0550   06/09/20 1630  ceFAZolin (ANCEF) IVPB 2g/100 mL premix        2 g 200 mL/hr over 30 Minutes Intravenous On call to O.R. 06/09/20 1616 06/09/20 1744   06/09/20 1619  ceFAZolin (ANCEF) 2-4 GM/100ML-% IVPB       Note to Pharmacy: [name]   : cabinet override      06/09/20 1619 06/09/20 1746   06/08/20 1245  cephALEXin (KEFLEX) capsule 250 mg  Status:  Discontinued        250 mg Oral Daily 06/08/20 1241 06/09/20 2154       DVT prophylaxis: SCDs, warfarin  Code Status: Full code  Family Communication: No family at bedside   Consultants:  None  Procedures:  Hemiarthroplasty left hip    Objective   Vitals:   06/15/20 0836 06/15/20 1300 06/15/20 2021 06/16/20 0427  BP: 140/62 (!) 142/66 (!) 140/52 (!) 141/60  Pulse: 71 73 74 69  Resp: 16 16 16 16   Temp: 98.2 F (36.8 C) 98.3 F (36.8 C) 98 F (36.7 C) 97.6 F (36.4 C)  TempSrc: Oral Oral Oral Oral  SpO2: 96% 97% 93% 96%  Weight:      Height:        Intake/Output Summary (Last 24 hours) at 06/16/2020 0853 Last data filed at 06/16/2020 0500 Gross per 24 hour   Intake 480 ml  Output 1200 ml  Net -720 ml    12/26 1901 - 12/28 0700 In: 590 [P.O.:480] Out: 1700 [Urine:1700]  Filed Weights   06/14/20 0500 06/14/20 0720 06/15/20 0500  Weight: 83.6 kg 80.6 kg 84.2 kg    Physical Examination:   General-sitting up in bed, appears in no acute distress  Heart-S1-S2, regular  Lungs-clear to auscultation bilaterally, no wheezing or crackles auscultated  Abdomen-soft, nontender, no organomegaly  Extremities-no edema in the lower extremities  Neuro-alert, oriented x3, no focal deficit noted   Status is: Inpatient  Dispo: The patient is from: Home              Anticipated d/c is to: Skilled nursing facility              Anticipated d/c date is: 06/16/2020              Patient currently medically stable for discharge  Barrier to discharge-awaiting bed at skilled nursing facility            Data Reviewed:   Recent Results (from the past 240 hour(s))  Resp Panel by RT-PCR (Flu A&B, Covid) Nasopharyngeal Swab     Status: None   Collection Time: 06/07/20  8:43 PM   Specimen: Nasopharyngeal Swab; Nasopharyngeal(NP) swabs in vial transport medium  Result Value Ref Range Status   SARS Coronavirus 2 by RT PCR NEGATIVE NEGATIVE Final    Comment: (NOTE) SARS-CoV-2 target nucleic acids are NOT DETECTED.  The SARS-CoV-2 RNA is generally detectable in upper respiratory specimens during the acute phase of infection. The lowest concentration of SARS-CoV-2 viral copies this assay can detect is 138 copies/mL. A negative result does not preclude SARS-Cov-2 infection and should not be used as the sole basis for treatment or other patient management decisions. A negative result may occur with  improper specimen collection/handling, submission of specimen other than nasopharyngeal swab, presence of viral mutation(s) within the areas targeted by this assay, and inadequate number of viral copies(<138 copies/mL). A negative result must be  combined with clinical observations, patient history, and epidemiological information. The expected result is Negative.  Fact Sheet for Patients:  EntrepreneurPulse.com.au  Fact Sheet for Healthcare Providers:  IncredibleEmployment.be  This test is no t yet approved or cleared by the [place] FDA and  has been authorized for detection and/or diagnosis of SARS-CoV-2 by FDA under an Emergency Use Authorization (EUA). This EUA will remain  in effect (meaning this test can be used) for the duration of the COVID-19 declaration under Section 564(b)(1) of the Act, 21 U.S.C.section 360bbb-3(b)(1), unless the authorization is terminated  or revoked sooner.       Influenza A by PCR NEGATIVE NEGATIVE Final   Influenza B by PCR NEGATIVE NEGATIVE Final    Comment: (NOTE) The Xpert Xpress SARS-CoV-2/FLU/RSV plus assay is intended as an aid in the diagnosis of influenza from Nasopharyngeal swab specimens and should not be used as a sole basis for treatment. Nasal washings and aspirates are unacceptable for Xpert Xpress SARS-CoV-2/FLU/RSV testing.  Fact Sheet for Patients: EntrepreneurPulse.com.au  Fact Sheet for Healthcare Providers: IncredibleEmployment.be  This test is not yet approved or cleared by the [place] FDA and has been authorized for detection and/or diagnosis of SARS-CoV-2 by FDA under an Emergency Use Authorization (EUA). This EUA will remain in effect (meaning this test can be used) for the duration of the COVID-19 declaration under Section 564(b)(1) of the Act, 21 U.S.C. section 360bbb-3(b)(1), unless the authorization is terminated or revoked.  Performed at [facility], [place] [address], [place], [place] [address]   Urine Culture     Status: Abnormal   Collection Time: 06/09/20  6:00 PM   Specimen: Urine, Catheterized  Result Value Ref Range Status   Specimen Description URINE,  CATHETERIZED  Final   Special Requests   Final    NONE Performed at [facility], [place] [address] [place], [place]  [address]    Culture MULTIPLE SPECIES PRESENT, SUGGEST RECOLLECTION (A)  Final   Report Status 06/10/2020 FINAL  Final  C Difficile Quick Screen (NO PCR Reflex)     Status: Abnormal   Collection Time: 06/10/20  1:38 PM   Specimen: STOOL  Result Value Ref Range Status   C Diff antigen POSITIVE (A) NEGATIVE Final   C Diff toxin NEGATIVE NEGATIVE Final   C Diff interpretation   Final    Results are indeterminate. Please contact the provider listed for your campus for C diff questions in [place].    Comment: Performed at [facility], [place] [address] [place], [place] [address]    No results for input(s): LIPASE, AMYLASE in the last 168 hours. No results for input(s): AMMONIA in the last 168 hours.  Cardiac Enzymes: No results for input(s): CKTOTAL, CKMB, CKMBINDEX, TROPONINI in the last 168 hours. BNP (last 3 results) Recent Labs    06/08/20 0525  BNP 2,447.3*      Yaakov Guthrie, MD  [facility] If 7PM-7AM, please contact night-coverage at [email or web address], Office  [phone]   06/16/2020, 8:53 AM  LOS: 9 days

## 2020-06-16 NOTE — Progress Notes (Signed)
[facility] Progress Note   Subjective:  Seen in room. Ongoing hip pain, but otherwise feels ok. Plan is for HD this afternoon.  Objective Vitals:   06/15/20 1300 06/15/20 2021 06/16/20 0427 06/16/20 0900  BP: (!) 142/66 (!) 140/52 (!) 141/60 139/62  Pulse: 73 74 69 65  Resp: 16 16 16 16   Temp: 98.3 F (36.8 C) 98 F (36.7 C) 97.6 F (36.4 C) 98 F (36.7 C)  TempSrc: Oral Oral Oral Oral  SpO2: 97% 93% 96% 97%  Weight:      Height:       Physical Exam General: Well appearing woman, NAD. Room air. Heart: RRR; 2/6 murmur Lungs: CTA anteriorly Abdomen: soft Extremities: No LE edema Dialysis Access: L AVF + thrill  Additional Objective Labs: Basic Metabolic Panel: Recent Labs  Lab 06/11/20 2302 06/12/20 0615 06/13/20 0155 06/14/20 0803 06/15/20 0308 06/16/20 0450  NA 131*   < > 135 130* 136  --   K 3.6   < > 4.5 6.2* 3.5  --   CL 90*   < > 98 95* 96*  --   CO2 26   < > 28 23 28   --   GLUCOSE 113*   < > 87 83 87  --   BUN 40*   < > 24* 42* 20  --   CREATININE 5.64*   < > 4.33* 6.17* 4.13* 5.90*  CALCIUM 9.1   < > 8.8* 9.0 8.6*  --   PHOS 3.8  --  2.5 2.8  --   --    < > = values in this interval not displayed.   Liver Function Tests: Recent Labs  Lab 06/11/20 2302 06/13/20 0155 06/14/20 0803  ALBUMIN 2.4* 2.4* 2.3*   CBC: Recent Labs  Lab 06/11/20 2302 06/12/20 0615 06/13/20 0155 06/14/20 0806 06/15/20 0308  WBC 9.2 9.3 8.0 9.1 9.3  HGB 9.1* 9.7* 9.8* 9.3* 9.7*  HCT 29.6* 31.3* 32.0* 31.1* 30.2*  MCV 95.8 94.8 96.7 97.2 95.0  PLT 232 220 217 269 243   Studies/Results: DG Shoulder Left  Result Date: 06/15/2020 CLINICAL DATA:  Coracoid impingement EXAM: LEFT SHOULDER - 2+ VIEW COMPARISON:  06/07/2020 FINDINGS: Internal rotation, external rotation, and transscapular views of the left shoulder are obtained. No fracture, subluxation, or dislocation. The density seen at the base of the coracoid on prior study simply reflects trabecular  markings, with no underlying bony abnormality. Joint spaces are well preserved. Left chest is clear. IMPRESSION: 1. Unremarkable left shoulder. Electronically Signed   By: Randa Ngo M.D.   On: 06/15/2020 17:51   Medications: . ferric gluconate (FERRLECIT/NULECIT) IV 125 mg (06/14/20 1007)  . methocarbamol (ROBAXIN) IV     . amiodarone  200 mg Oral Daily  . calcium acetate  667 mg Oral TID WC  . cephALEXin  250 mg Oral Daily  . feeding supplement (NEPRO CARB STEADY)  237 mL Oral BID BM  . latanoprost  1 drop Both Eyes QHS  . multivitamin  1 tablet Oral QHS  . saccharomyces boulardii  250 mg Oral Daily  . timolol  1 drop Both Eyes Daily  . warfarin  2.5 mg Oral Once per day on Sun Mon Wed Fri Sat   And  . warfarin  5 mg Oral Once per day on Tue Thu  . Warfarin - Pharmacist Dosing Inpatient   Does not apply q1600    Dialysis Orders: TTS @ [facility] 4 hrs 180NRe 400/800 77.5 kg 2.0  K/ 2.0 Ca UFP 4 AVF Heparin 1000 units IV TIW - Mircera 150 mcg IV q 2 weeks (last dose 05/28/2020 last HGB 10.6 06/04/2020) - Venofer 100 mg IV X 10 doses (1/10 doses given) Tsat 19 05/28/2020.  Assessment/Plan: 1. L Hip fracture s/p fall: S/p L hip hemiarthroplasty12/21/2021. Per primary/Orthopedics.  2. Acute on chronic diastolic HF- Last EL38 to 55% 06/08/2020.Hadevidence of mild volume overload by exam/CXR 12/20.Volume excess improved post HD.  3. ESRD: Continue HD per TTS schedule - HD today.Issues with cramping. Lowered machine temp-change to UFP 2 which has helped.Please continue these orders on DC. 4. Hypertension - BP controlled without meds. No edema. 5. Anemia: Hgb 9.7 - Aranesp 25 mcg given 12/26 - follow for further need.She is finishing course of IV iron. 6. Metabolic bone disease: CorrCa ok, Phos was low - Phoslo dose reduced to 1/meals. 7. Nutrition - Albumin low, restarting Nepro. 8. PAF-on coumadin. Management per primary/pharmacy.  9. Prolonged QTc in setting of RBBB-no  benadryl or agents that could increase QTc   [name], PA-C 06/16/2020, 10:22 AM  [facility]

## 2020-06-16 NOTE — Progress Notes (Signed)
Physical Therapy Treatment Patient Details Name: [name] MRN: [id number] DOB: Jun 13, 1946 Today's Date: 06/16/2020    History of Present Illness Pt is a 74 y.o. female admitted 06/07/20 after fall sustaining L hip femoral neck fx. S/p posterior hemiarthroplasty 12/21. PMH includes ESRD (HD TTS), CHF, PAF, HTN, L TKA (10/2019).    PT Comments    Patient progressing well towards PT goals. Increased time to perform all mobility. Requires Min guard-Min A for bed mobility and transfers. Improved ambulation distance with Min A and use of RW for support. Heavy reliance on BUEs on RW during ambulation requiring 2 rest breaks. Tolerated there ex. Able to recall 2/3 posterior hip precautions; reviewed them in detail. "I can never remember them all." Continue to recommend SNF. Will follow.    Follow Up Recommendations  SNF;Supervision for mobility/OOB     Equipment Recommendations  3in1 (PT);Rolling walker with 5" wheels    Recommendations for Other Services       Precautions / Restrictions Precautions Precautions: Fall;Posterior Hip Precaution Booklet Issued: Yes (comment) Precaution Comments: pt able to state 2/3 precautions, required cues for no internal rotation Restrictions Weight Bearing Restrictions: Yes LLE Weight Bearing: Weight bearing as tolerated    Mobility  Bed Mobility Overal bed mobility: Needs Assistance Bed Mobility: Supine to Sit     Supine to sit: Min guard;HOB elevated     General bed mobility comments: Able to bring LLE to EOB with increased time, use of rail.  Transfers Overall transfer level: Needs assistance Equipment used: Rolling walker (2 wheeled) Transfers: Sit to/from Stand Sit to Stand: Min guard;Min assist         General transfer comment: Min A to steady in standing, stood from EOB x1, transferred to chair post ambulation.  Ambulation/Gait Ambulation/Gait assistance: Min assist Gait Distance (Feet): 50 Feet Assistive device: Rolling  walker (2 wheeled) Gait Pattern/deviations: Step-to pattern;Decreased stride length;Decreased step length - right;Decreased weight shift to left;Antalgic;Step-through pattern;Decreased stance time - left Gait velocity: decreased   General Gait Details: Slow, step to gait initially with cues for step through gait; heavy reliance on UEs on RW. Cues to pick up feet during turns to adhere to precautions. 2 standing rest breaks due to sore UEs.   Stairs             Wheelchair Mobility    Modified Rankin (Stroke Patients Only)       Balance Overall balance assessment: Needs assistance Sitting-balance support: Feet supported;No upper extremity supported Sitting balance-Leahy Scale: Fair Sitting balance - Comments: At times propping with UEs - more due to fatigue or offloading than balance.   Standing balance support: During functional activity Standing balance-Leahy Scale: Poor Standing balance comment: Requires UE support in standing with RW.                            Cognition Arousal/Alertness: Awake/alert Behavior During Therapy: WFL for tasks assessed/performed Overall Cognitive Status: Within Functional Limits for tasks assessed                                        Exercises Total Joint Exercises Ankle Circles/Pumps: AROM;Both;10 reps;Supine Quad Sets: AROM;Both;5 reps;Supine    General Comments        Pertinent Vitals/Pain Pain Assessment: 0-10 Pain Score: 4  Faces Pain Scale: Hurts little more Pain Location: Lft hip Pain  Descriptors / Indicators: Discomfort;Sore Pain Intervention(s): Monitored during session;Repositioned;Ice applied    Home Living                      Prior Function            PT Goals (current goals can now be found in the care plan section) Progress towards PT goals: Progressing toward goals    Frequency    Min 3X/week      PT Plan Current plan remains appropriate    Co-evaluation               AM-PAC PT "6 Clicks" Mobility   Outcome Measure  Help needed turning from your back to your side while in a flat bed without using bedrails?: A Little Help needed moving from lying on your back to sitting on the side of a flat bed without using bedrails?: A Little Help needed moving to and from a bed to a chair (including a wheelchair)?: A Little Help needed standing up from a chair using your arms (e.g., wheelchair or bedside chair)?: A Little Help needed to walk in hospital room?: A Little Help needed climbing 3-5 steps with a railing? : A Lot 6 Click Score: 17    End of Session Equipment Utilized During Treatment: Gait belt Activity Tolerance: Patient tolerated treatment well Patient left: in chair;with call bell/phone within reach;with chair alarm set Nurse Communication: Mobility status PT Visit Diagnosis: Unsteadiness on feet (R26.81);Muscle weakness (generalized) (M62.81);Pain Pain - Right/Left: Left Pain - part of body: Hip     Time: 0272-5366 PT Time Calculation (min) (ACUTE ONLY): 26 min  Charges:  $Gait Training: 8-22 mins $Therapeutic Activity: 8-22 mins                     Marisa Severin, PT, DPT Acute Rehabilitation Services Pager [phone] Office [place] 06/16/2020, 1:31 PM

## 2020-06-16 NOTE — TOC Progression Note (Signed)
Transition of Care [facility]) - Progression Note    Patient Details  Name: [name] MRN: [id number] Date of Birth: [birth year]  [facility] CM/SW [place], [place] Phone Number: 06/16/2020, 4:26 PM  Clinical Narrative:     CSW made multiple attempts to give bed offers. CSW left offers on bedside table. CSW will follow up in the AM.   Expected Discharge Plan: [place] Barriers to Discharge: SNF Pending bed offer,Insurance Authorization  Expected Discharge Plan and Services Expected Discharge Plan: [place] Choice: [place] arrangements for the past 2 months: Single Family Home                                       Social Determinants of Health (SDOH) Interventions    Readmission Risk Interventions No flowsheet data found.  Emeterio Reeve, Latanya Presser, [place] Social Worker [phone]

## 2020-06-16 NOTE — Plan of Care (Signed)

## 2020-06-17 DIAGNOSIS — S72002D Fracture of unspecified part of neck of left femur, subsequent encounter for closed fracture with routine healing: Secondary | ICD-10-CM | POA: Diagnosis not present

## 2020-06-17 LAB — PROTIME-INR
INR: 2.7 — ABNORMAL HIGH (ref 0.8–1.2)
Prothrombin Time: 27.9 seconds — ABNORMAL HIGH (ref 11.4–15.2)

## 2020-06-17 NOTE — Progress Notes (Signed)
Inpatient Rehabilitation Admissions Coordinator   Noted family requests Cir consult. I will place order and we can assess for candidacy. It is doubtful that [facility] will approve for this diagnosis.  Danne Baxter, RN, MSN Rehab Admissions Coordinator [phone] 06/17/2020 4:02 PM

## 2020-06-17 NOTE — Progress Notes (Signed)
[facility] Progress Note   Subjective: Seen in room. No overnight issues - no CP/dyspnea. SNF placement pending.   Objective Vitals:   06/16/20 1939 06/17/20 0325 06/17/20 0500 06/17/20 0813  BP: (!) 120/41 (!) 135/53  (!) 155/57  Pulse: 73 69  67  Resp: 16 14  18   Temp: 97.8 F (36.6 C) 97.6 F (36.4 C)  97.7 F (36.5 C)  TempSrc: Oral Oral  Oral  SpO2: 94% 94%  96%  Weight:   79.1 kg   Height:       Physical Exam General:Well appearing woman, NAD. Room air. Heart:RRR; 2/6 murmur Lungs:CTA anteriorly Abdomen:soft Extremities:No LE edema Dialysis Access:L AVF + thrill  Additional Objective Labs: Basic Metabolic Panel: Recent Labs  Lab 06/13/20 0155 06/14/20 0803 06/15/20 0308 06/16/20 0450 06/16/20 1102  NA 135 130* 136  --  131*  K 4.5 6.2* 3.5  --  3.9  CL 98 95* 96*  --  94*  CO2 28 23 28   --  25  GLUCOSE 87 83 87  --  94  BUN 24* 42* 20  --  37*  CREATININE 4.33* 6.17* 4.13* 5.90* 6.19*  CALCIUM 8.8* 9.0 8.6*  --  8.8*  PHOS 2.5 2.8  --   --  3.2   Liver Function Tests: Recent Labs  Lab 06/13/20 0155 06/14/20 0803 06/16/20 1102  ALBUMIN 2.4* 2.3* 2.4*   CBC: Recent Labs  Lab 06/12/20 0615 06/13/20 0155 06/14/20 0806 06/15/20 0308 06/16/20 1102  WBC 9.3 8.0 9.1 9.3 10.8*  HGB 9.7* 9.8* 9.3* 9.7* 10.7*  HCT 31.3* 32.0* 31.1* 30.2* 35.3*  MCV 94.8 96.7 97.2 95.0 97.2  PLT 220 217 269 243 303   Studies/Results: DG Shoulder Left  Result Date: 06/15/2020 CLINICAL DATA:  Coracoid impingement EXAM: LEFT SHOULDER - 2+ VIEW COMPARISON:  06/07/2020 FINDINGS: Internal rotation, external rotation, and transscapular views of the left shoulder are obtained. No fracture, subluxation, or dislocation. The density seen at the base of the coracoid on prior study simply reflects trabecular markings, with no underlying bony abnormality. Joint spaces are well preserved. Left chest is clear. IMPRESSION: 1. Unremarkable left shoulder.  Electronically Signed   By: Randa Ngo M.D.   On: 06/15/2020 17:51   Medications: . ferric gluconate (FERRLECIT/NULECIT) IV 125 mg (06/16/20 1553)  . methocarbamol (ROBAXIN) IV     . amiodarone  200 mg Oral Daily  . calcium acetate  667 mg Oral TID WC  . cephALEXin  250 mg Oral Daily  . feeding supplement (NEPRO CARB STEADY)  237 mL Oral BID BM  . latanoprost  1 drop Both Eyes QHS  . multivitamin  1 tablet Oral QHS  . saccharomyces boulardii  250 mg Oral Daily  . timolol  1 drop Both Eyes Daily  . warfarin  2.5 mg Oral Once per day on Sun Mon Wed Fri Sat   And  . warfarin  5 mg Oral Once per day on Tue Thu  . Warfarin - Pharmacist Dosing Inpatient   Does not apply q1600    Dialysis Orders: TTS @ [facility] 4 hrs 180NRe 400/800 77.5 kg 2.0 K/ 2.0 Ca UFP 4 AVF Heparin 1000 units IV TIW - Mircera 150 mcg IV q 2 weeks (last dose 05/28/2020 last HGB 10.6 06/04/2020) - Venofer 100 mg IV X 10 doses (1/10 doses given) Tsat 19 05/28/2020.  Assessment/Plan: 1. L Hip fractures/p fall: S/p L hiphemiarthroplasty12/21/2021. Per primary/Orthopedics.  2. Acute on chronic diastolic HF- Last  EF50 to 55% 06/08/2020.Hadevidence of mild volume overload by exam/CXR 12/20.Volume excess improved post HD.  3. ESRD: Continue HD per TTS schedule - next HD tomorrow.Issues with cramping. Lowered machine temp-change to UFP 2 which has helped.Please continue these orders on DC. 4. Hypertension - BPreasonably controlled without meds. No edema. 5. Anemia: Hgb 10.7 -Aranesp 25 mcggiven 12/26 - follow for further need.She is finishing course of IV iron. 6. Metabolic bone disease: CorrCa ok, Phos was low - Phoslo dose reduced to 1/meals. 7. Nutrition - Albuminlow, restarted Nepro. 8. PAF-on coumadin. Management per primary/pharmacy.  9. Prolonged QTc in setting of RBBB-no benadryl or agents that could increase QTc   [name], PA-C 06/17/2020, 10:14 AM  [employer]

## 2020-06-17 NOTE — Plan of Care (Signed)

## 2020-06-17 NOTE — TOC Progression Note (Signed)
Transition of Care [facility]) - Progression Note    Patient Details  Name: [name] MRN: [id number] Date of Birth: [birth year]  Transition of [facility] CM/SW [place], [place] Phone Number: 06/17/2020, 4:09 PM  Clinical Narrative:     Pts family requested CIR. CSW reached out to [facility] admissions coordinator.  Expected Discharge Plan: Skilled Nursing Facility Barriers to Discharge: SNF Pending bed offer,Insurance Authorization  Expected Discharge Plan and Services Expected Discharge Plan: [place] Choice: [place] arrangements for the past 2 months: Single Family Home                                       Social Determinants of Health (SDOH) Interventions    Readmission Risk Interventions No flowsheet data found.  Emeterio Reeve, Latanya Presser, [place] Social Worker [phone]

## 2020-06-17 NOTE — Progress Notes (Signed)
OT Cancellation Note  Patient Details Name: [name] MRN: [id number] DOB: [birth year]   Cancelled Treatment:    Reason Eval/Treat Not Completed: Other (comment) pt reports having just received muscle relaxer feeling a little "woozy" preferring to participate in therapy later in the day, will check back as time allows for OT session.   Harley Alto., COTA/L Acute Rehabilitation Services [phone]   Precious Haws 06/17/2020, 10:38 AM

## 2020-06-17 NOTE — Progress Notes (Signed)
[place] for warfarin  Indication:  Atrial fibrillation  Allergies  Allergen Reactions  . Percocet [Oxycodone-Acetaminophen] Nausea And Vomiting  . Vicodin [Hydrocodone-Acetaminophen] Nausea And Vomiting  . Chlorhexidine Rash    Pt states she gets rash similar to "sunburn"    Patient Measurements: Height: 5\' 2"  (157.5 cm) Weight: 79.1 kg (174 lb 6.1 oz) IBW/kg (Calculated) : 50.1  Vital Signs: Temp: 97.6 F (36.4 C) (12/29 0325) Temp Source: Oral (12/29 0325) BP: 135/53 (12/29 0325) Pulse Rate: 69 (12/29 0325)  Labs: Recent Labs    06/14/20 0806 06/15/20 0308 06/16/20 0450 06/16/20 1102 06/17/20 0434  HGB 9.3* 9.7*  --  10.7*  --   HCT 31.1* 30.2*  --  35.3*  --   PLT 269 243  --  303  --   LABPROT  --  25.1*  --   --  27.9*  INR  --  2.4*  --   --  2.7*  CREATININE  --  4.13* 5.90* 6.19*  --     Estimated Creatinine Clearance: 7.6 mL/min (A) (by C-G formula based on SCr of 6.19 mg/dL (H)).   Medical History: History reviewed. No pertinent past medical history.  Assessment: 74 yr old female admitted on 06/07/20 with L femoral neck fracture; pt is S/P hemiarthroplasty.  Patient was taking warfarin at home prior to admission for atrial fibrillation (5 mg on Tues/Thurs, 2.5 mg on other days).  Warfarin was held for surgery, and pt was given vitamin K 5 mg IV X 1 on 12/19. Patient transitioned to heparin infusion in preparation for surgery. Heparin infusion now off and warfarin resumed 12/24.  INR remains in goal range this morning at 2.7 (checking MWF). CBC stable with no bleeding noted.  On amiodarone PTA, continued during this admission.   Goal of Therapy:  INR 2-3   Monitor platelets by anticoagulation protocol: Yes   Plan:  - Warfarin 2.5 mg PO daily except 5 mg on Tue/Thu per home dosing - Check INR MWF - Monitor CBC, signs/symptoms of bleeding   Arturo Morton, PharmD, BCPS Please check AMION for all [place] contact numbers Clinical Pharmacist 06/17/2020 8:06 AM

## 2020-06-17 NOTE — Progress Notes (Signed)
Occupational Therapy Treatment Patient Details Name: [name] MRN: [id number] DOB: 1946-06-06 Today's Date: 06/17/2020    History of present illness Pt is a 74 y.o. female admitted 06/07/20 after fall sustaining L hip femoral neck fx. S/p posterior hemiarthroplasty 12/21. PMH includes ESRD (HD TTS), CHF, PAF, HTN, L TKA (10/2019).   OT comments  Pt making steady progress towards OT goals this session. Session focus on functional mobility and BADL reeducation. Pt able to state 2/3 hip precautions but able to demo safe adherence to precautions functionally. Pt able to ambulate to bathroom this session ~ 14 ft with RW and min guard assist. Pt required total A for posterior pericare this session in standing d/t pain. Pt continues to present with increased pain, decreased activity tolerance and posterior hip precautions impacting pts ability to complete BADLs independently. Pt would continue to benefit from skilled occupational therapy while admitted and after d/c to address the below listed limitations in order to improve overall functional mobility and facilitate independence with BADL participation. DC plan remains appropriate, will follow acutely per POC.     Follow Up Recommendations  SNF;CIR    Equipment Recommendations  Tub/shower seat    Recommendations for Other Services      Precautions / Restrictions Precautions Precautions: Fall;Posterior Hip Precaution Booklet Issued: Yes (comment) Precaution Comments: pt able to state 2/3 precautions, required cues for no internal rotation Restrictions Weight Bearing Restrictions: Yes LLE Weight Bearing: Weight bearing as tolerated       Mobility Bed Mobility               General bed mobility comments: OOB in recliner  Transfers Overall transfer level: Needs assistance Equipment used: Rolling walker (2 wheeled) Transfers: Sit to/from Stand Sit to Stand: Min guard;Min assist         General transfer comment: pt  sit<>stand x3 from recliner and toilet, MIN guard from recliner needing cues for hand placement and overall technique, MIN A for toilet needing cues to utilize grab bars    Balance Overall balance assessment: Needs assistance Sitting-balance support: Feet supported;No upper extremity supported Sitting balance-Leahy Scale: Fair     Standing balance support: Bilateral upper extremity supported;During functional activity Standing balance-Leahy Scale: Poor Standing balance comment: Requires UE support in standing with RW.                           ADL either performed or assessed with clinical judgement   ADL Overall ADL's : Needs assistance/impaired                 Upper Body Dressing : Minimal assistance;Sitting Upper Body Dressing Details (indicate cue type and reason): to don gown as back side cover     Toilet Transfer: Minimal assistance;RW;Grab Information systems manager Details (indicate cue type and reason): pt able to ambulate to bathroom for toileting with minguard and RW needing MIN A to ascend <>descend on toilet with cues to utilize grab bars Toileting- Water quality scientist and Hygiene: Total assistance;Sit to/from stand Toileting - Clothing Manipulation Details (indicate cue type and reason): total A for posterior pericare secondary to increased pain     Functional mobility during ADLs: Minimal assistance;Min guard;Rolling walker General ADL Comments: pt continues to present with pain, decreassed activity tolerance and hip precautions impacting pts ability to complete BADL independently     Vision       Perception     Praxis  Cognition Arousal/Alertness: Awake/alert Behavior During Therapy: WFL for tasks assessed/performed Overall Cognitive Status: Within Functional Limits for tasks assessed                                 General Comments: very pleasant and motivated to do more each session        Exercises      Shoulder Instructions       General Comments      Pertinent Vitals/ Pain       Pain Assessment: 0-10 Pain Score: 7  Pain Location: Lft hip Pain Descriptors / Indicators: Discomfort;Sore;Moaning;Grimacing Pain Intervention(s): Limited activity within patient's tolerance;Monitored during session;Repositioned;Ice applied  Home Living                                          Prior Functioning/Environment              Frequency  Min 2X/week        Progress Toward Goals  OT Goals(current goals can now be found in the care plan section)  Progress towards OT goals: Progressing toward goals  Acute Rehab OT Goals Patient Stated Goal: To get stronger and get back to independence OT Goal Formulation: With patient Time For Goal Achievement: 06/26/20 Potential to Achieve Goals: Good  Plan Discharge plan remains appropriate;Frequency remains appropriate    Co-evaluation                 AM-PAC OT "6 Clicks" Daily Activity     Outcome Measure   Help from another person eating meals?: None Help from another person taking care of personal grooming?: A Little Help from another person toileting, which includes using toliet, bedpan, or urinal?: A Lot Help from another person bathing (including washing, rinsing, drying)?: A Lot Help from another person to put on and taking off regular upper body clothing?: A Little Help from another person to put on and taking off regular lower body clothing?: Total 6 Click Score: 15    End of Session Equipment Utilized During Treatment: Gait belt;Rolling walker  OT Visit Diagnosis: Unsteadiness on feet (R26.81);Other abnormalities of gait and mobility (R26.89);History of falling (Z91.81);Pain Pain - Right/Left: Left Pain - part of body: Hip   Activity Tolerance Patient tolerated treatment well   Patient Left in chair;with call bell/phone within reach;with chair alarm set   Nurse Communication Mobility  status;Other (comment) (able to walk to BR for toileting, applied ice at end of session)        Time: 8502-7741 OT Time Calculation (min): 23 min  Charges: OT General Charges $OT Visit: 1 Visit OT Treatments $Self Care/Home Management : 23-37 mins  Harley Alto., COTA/L Acute Rehabilitation Services [phone]    Precious Haws 06/17/2020, 3:09 PM

## 2020-06-17 NOTE — Progress Notes (Signed)
PROGRESS NOTE    [name]  ACZ:[id number] DOB: 08-09-1945 DOA: 06/07/2020 PCP: No primary care provider on file.   No chief complaint on file.  Brief Narrative:  74 year old female with history of dilated cardiomyopathy, EF of 20 to 25%, in setting of sepsis in 1093 complicated by demand ischemia, cardiac catheterization in 2014 showed normal coronaries, chronic diastolic CHF, PAF on Coumadin anticoagulation, moderate to severe aortic stenosis, tolerated TKR in/21 per cardiology may need consideration of AVR at some point, hyperlipidemia, essential hypertension, ESRD on TTS hemodialysis, GERD, migraine, glaucoma presented to the ED following mechanical fall at home with head injury and left hip pain.  In the ED she was found to be hypoxemic with O2 sats 82% on room air and placed on oxygen.  CT head was negative for acute abnormality.  X-ray of the hip showed acute left femoral neck fracture with superior subluxation.  Orthopedics was consulted and underwent repair on 06/09/2020.   Assessment & Plan:   Principal Problem:   Hip fracture ([place]) Active Problems:   Leukocytosis   Acute hypoxemic respiratory failure (HCC)   Atrial fibrillation (HCC)   ESRD (end stage renal disease) ([place])  1. Left femoral neck fracture: s/p left hemiarthroplasty on 06/09/2020.  Initially requested CIR however insurance will not cover.  Awaiting skilled nursing facility placement.  Pt and pt daughter requesting [name] to look into CIR again, discussed with SW.   2. Acute hypoxemic respiratory failure-secondary to acute on chronic diastolic CHF  1. patient on ESRD, required 2 L of oxygen, s/p HD -> now on RA 2. Most recent echo showed EF of 55 to 23%, grade 2 diastolic dysfunction.  3. C. difficile colonization, loose stool-patient has history of C. difficile, C. difficile PCR was checked however was found to have positive antigen but PCR toxin was negative.  Abdomen is soft, nontender, WBC is normal. She was  having intermittent loose stools. Now resolved per patient. She has been on chronic Keflex for past 4 years.  At this time C. difficile appears to be Kriss Ishler colonizer without active infection.  Continue probiotics.  4. ESRD on TTS hemodialysis. Nephrology has been consulted.  Dialysis per nephrology.  5. Paroxysmal atrial fibrillation-patient has history of possible atrial fibrillation, she received vitamin K in preparation of surgery for supratherapeutic INR.   1. Continue warfarin per pharmacy  6. Hypertension-blood pressure is stable  7. Focal osseous irregularity-seen on the chest x-ray.  There is Lakysha Kossman focal osseous irregularity near the left coracoid process, could be from patient positioning. X-ray of the left shoulder unremarkable per report.  8. Recurrent UTIs -continue Keflex, patient has been on Keflex for past 4 years.  DVT prophylaxis: warfarin  Code Status: full  Family Communication: daughter at bedside Disposition:   Status is: Inpatient  Remains inpatient appropriate because:Inpatient level of care appropriate due to severity of illness   Dispo: The patient is from: Home              Anticipated d/c is to: pending              Anticipated d/c date is: > 3 days              Patient currently is not medically stable to d/c.   Consultants:   orthopedics  Procedures:  12/21 hemiarthroplasty   Antimicrobials:  Anti-infectives (From admission, onward)   Start     Dose/Rate Route Frequency Ordered Stop   06/11/20 1000  cephALEXin (KEFLEX) capsule 250  mg        250 mg Oral Daily 06/09/20 2154     06/09/20 2245  ceFAZolin (ANCEF) IVPB 2g/100 mL premix        2 g 200 mL/hr over 30 Minutes Intravenous Every 6 hours 06/09/20 2152 06/10/20 0550   06/09/20 1630  ceFAZolin (ANCEF) IVPB 2g/100 mL premix        2 g 200 mL/hr over 30 Minutes Intravenous On call to O.R. 06/09/20 1616 06/09/20 1744   06/09/20 1619  ceFAZolin (ANCEF) 2-4 GM/100ML-% IVPB       Note to Pharmacy:  [name]   : cabinet override      06/09/20 1619 06/09/20 1746   06/08/20 1245  cephALEXin (KEFLEX) capsule 250 mg  Status:  Discontinued        250 mg Oral Daily 06/08/20 1241 06/09/20 2154     Subjective: No new complaints Daughter asking about CIR again  Objective: Vitals:   06/16/20 1939 06/17/20 0325 06/17/20 0500 06/17/20 0813  BP: (!) 120/41 (!) 135/53  (!) 155/57  Pulse: 73 69  67  Resp: 16 14  18   Temp: 97.8 F (36.6 C) 97.6 F (36.4 C)  97.7 F (36.5 C)  TempSrc: Oral Oral  Oral  SpO2: 94% 94%  96%  Weight:   79.1 kg   Height:        Intake/Output Summary (Last 24 hours) at 06/17/2020 1451 Last data filed at 06/17/2020 0500 Gross per 24 hour  Intake --  Output 2240 ml  Net -2240 ml   Filed Weights   06/16/20 1241 06/16/20 1648 06/17/20 0500  Weight: 79.8 kg 78.4 kg 79.1 kg    Examination:  General exam: Appears calm and comfortable  Respiratory system: Clear to auscultation. Respiratory effort normal. Cardiovascular system: S1 & S2 heard, RRR. Gastrointestinal system: Abdomen is nondistended, soft and nontender. Central nervous system: Alert and oriented. No focal neurological deficits. Extremities: LLE with intact dressing Skin: No rashes, lesions or ulcers Psychiatry: Judgement and insight appear normal. Mood & affect appropriate.     Data Reviewed: I have personally reviewed following labs and imaging studies  CBC: Recent Labs  Lab 06/12/20 0615 06/13/20 0155 06/14/20 0806 06/15/20 0308 06/16/20 1102  WBC 9.3 8.0 9.1 9.3 10.8*  HGB 9.7* 9.8* 9.3* 9.7* 10.7*  HCT 31.3* 32.0* 31.1* 30.2* 35.3*  MCV 94.8 96.7 97.2 95.0 97.2  PLT 220 217 269 243 449    Basic Metabolic Panel: Recent Labs  Lab 06/11/20 2302 06/12/20 0615 06/13/20 0155 06/14/20 0803 06/15/20 0308 06/16/20 0450 06/16/20 1102  NA 131* 138 135 130* 136  --  131*  K 3.6 4.0 4.5 6.2* 3.5  --  3.9  CL 90* 98 98 95* 96*  --  94*  CO2 26 29 28 23 28   --  25  GLUCOSE  113* 117* 87 83 87  --  94  BUN 40* 11 24* 42* 20  --  37*  CREATININE 5.64* 2.67* 4.33* 6.17* 4.13* 5.90* 6.19*  CALCIUM 9.1 8.2* 8.8* 9.0 8.6*  --  8.8*  PHOS 3.8  --  2.5 2.8  --   --  3.2    GFR: Estimated Creatinine Clearance: 7.6 mL/min (Darrek Leasure) (by C-G formula based on SCr of 6.19 mg/dL (H)).  Liver Function Tests: Recent Labs  Lab 06/11/20 2302 06/13/20 0155 06/14/20 0803 06/16/20 1102  ALBUMIN 2.4* 2.4* 2.3* 2.4*    CBG: No results for input(s): GLUCAP in the last 168  hours.   Recent Results (from the past 240 hour(s))  Resp Panel by RT-PCR (Flu Nobuo Nunziata&B, Covid) Nasopharyngeal Swab     Status: None   Collection Time: 06/07/20  8:43 PM   Specimen: Nasopharyngeal Swab; Nasopharyngeal(NP) swabs in vial transport medium  Result Value Ref Range Status   SARS Coronavirus 2 by RT PCR NEGATIVE NEGATIVE Final    Comment: (NOTE) SARS-CoV-2 target nucleic acids are NOT DETECTED.  The SARS-CoV-2 RNA is generally detectable in upper respiratory specimens during the acute phase of infection. The lowest concentration of SARS-CoV-2 viral copies this assay can detect is 138 copies/mL. Siriyah Ambrosius negative result does not preclude SARS-Cov-2 infection and should not be used as the sole basis for treatment or other patient management decisions. Pink Maye negative result may occur with  improper specimen collection/handling, submission of specimen other than nasopharyngeal swab, presence of viral mutation(s) within the areas targeted by this assay, and inadequate number of viral copies(<138 copies/mL). Tedra Coppernoll negative result must be combined with clinical observations, patient history, and epidemiological information. The expected result is Negative.  Fact Sheet for Patients:  EntrepreneurPulse.com.au  Fact Sheet for Healthcare Providers:  IncredibleEmployment.be  This test is no t yet approved or cleared by the [place] FDA and  has been authorized for detection  and/or diagnosis of SARS-CoV-2 by FDA under an Emergency Use Authorization (EUA). This EUA will remain  in effect (meaning this test can be used) for the duration of the COVID-19 declaration under Section 564(b)(1) of the Act, 21 U.S.C.section 360bbb-3(b)(1), unless the authorization is terminated  or revoked sooner.       Influenza Tyress Loden by PCR NEGATIVE NEGATIVE Final   Influenza B by PCR NEGATIVE NEGATIVE Final    Comment: (NOTE) The Xpert Xpress SARS-CoV-2/FLU/RSV plus assay is intended as an aid in the diagnosis of influenza from Nasopharyngeal swab specimens and should not be used as Mikale Silversmith sole basis for treatment. Nasal washings and aspirates are unacceptable for Xpert Xpress SARS-CoV-2/FLU/RSV testing.  Fact Sheet for Patients: EntrepreneurPulse.com.au  Fact Sheet for Healthcare Providers: IncredibleEmployment.be  This test is not yet approved or cleared by the [place] FDA and has been authorized for detection and/or diagnosis of SARS-CoV-2 by FDA under an Emergency Use Authorization (EUA). This EUA will remain in effect (meaning this test can be used) for the duration of the COVID-19 declaration under Section 564(b)(1) of the Act, 21 U.S.C. section 360bbb-3(b)(1), unless the authorization is terminated or revoked.  Performed at [facility], [place] [address], [place], [place] [address]   Urine Culture     Status: Abnormal   Collection Time: 06/09/20  6:00 PM   Specimen: Urine, Catheterized  Result Value Ref Range Status   Specimen Description URINE, CATHETERIZED  Final   Special Requests   Final    NONE Performed at [facility], [place] [address] [place], [place] [address]    Culture MULTIPLE SPECIES PRESENT, SUGGEST RECOLLECTION (Alesi Zachery)  Final   Report Status 06/10/2020 FINAL  Final  C Difficile Quick Screen (NO PCR Reflex)     Status: Abnormal   Collection Time: 06/10/20  1:38 PM   Specimen: STOOL  Result Value Ref Range  Status   C Diff antigen POSITIVE (Terrika Zuver) NEGATIVE Final   C Diff toxin NEGATIVE NEGATIVE Final   C Diff interpretation   Final    Results are indeterminate. Please contact the provider listed for your campus for C diff questions in [employer] [facility].    Comment: Performed at  [facility], [place] [address] [place], [place] [address]         Radiology Studies: DG Shoulder Left  Result Date: 06/15/2020 CLINICAL DATA:  Coracoid impingement EXAM: LEFT SHOULDER - 2+ VIEW COMPARISON:  06/07/2020 FINDINGS: Internal rotation, external rotation, and transscapular views of the left shoulder are obtained. No fracture, subluxation, or dislocation. The density seen at the base of the coracoid on prior study simply reflects trabecular markings, with no underlying bony abnormality. Joint spaces are well preserved. Left chest is clear. IMPRESSION: 1. Unremarkable left shoulder. Electronically Signed   By: Randa Ngo M.D.   On: 06/15/2020 17:51        Scheduled Meds: . amiodarone  200 mg Oral Daily  . calcium acetate  667 mg Oral TID WC  . cephALEXin  250 mg Oral Daily  . feeding supplement (NEPRO CARB STEADY)  237 mL Oral BID BM  . latanoprost  1 drop Both Eyes QHS  . multivitamin  1 tablet Oral QHS  . saccharomyces boulardii  250 mg Oral Daily  . timolol  1 drop Both Eyes Daily  . warfarin  2.5 mg Oral Once per day on Sun Mon Wed Fri Sat   And  . warfarin  5 mg Oral Once per day on Tue Thu  . Warfarin - Pharmacist Dosing Inpatient   Does not apply q1600   Continuous Infusions: . ferric gluconate (FERRLECIT/NULECIT) IV 125 mg (06/16/20 1553)  . methocarbamol (ROBAXIN) IV       LOS: 10 days    Time spent: over 30 min    Fayrene Helper, MD [facility]   To contact the attending provider between 7A-7P or the covering provider during after hours 7P-7A, please log into the web site [email or web address] and access using universal [place] password for that web site. If you do not have the  password, please call the hospital operator.  06/17/2020, 2:51 PM

## 2020-06-18 DIAGNOSIS — S72002D Fracture of unspecified part of neck of left femur, subsequent encounter for closed fracture with routine healing: Secondary | ICD-10-CM | POA: Diagnosis not present

## 2020-06-18 LAB — CBC WITH DIFFERENTIAL/PLATELET
Abs Immature Granulocytes: 0.11 10*3/uL — ABNORMAL HIGH (ref 0.00–0.07)
Basophils Absolute: 0.1 10*3/uL (ref 0.0–0.1)
Basophils Relative: 1 %
Eosinophils Absolute: 0.3 10*3/uL (ref 0.0–0.5)
Eosinophils Relative: 2 %
HCT: 32.3 % — ABNORMAL LOW (ref 36.0–46.0)
Hemoglobin: 10 g/dL — ABNORMAL LOW (ref 12.0–15.0)
Immature Granulocytes: 1 %
Lymphocytes Relative: 8 %
Lymphs Abs: 0.9 10*3/uL (ref 0.7–4.0)
MCH: 29.7 pg (ref 26.0–34.0)
MCHC: 31 g/dL (ref 30.0–36.0)
MCV: 95.8 fL (ref 80.0–100.0)
Monocytes Absolute: 1 10*3/uL (ref 0.1–1.0)
Monocytes Relative: 8 %
Neutro Abs: 9.8 10*3/uL — ABNORMAL HIGH (ref 1.7–7.7)
Neutrophils Relative %: 80 %
Platelets: 270 10*3/uL (ref 150–400)
RBC: 3.37 MIL/uL — ABNORMAL LOW (ref 3.87–5.11)
RDW: 16.8 % — ABNORMAL HIGH (ref 11.5–15.5)
WBC: 12.2 10*3/uL — ABNORMAL HIGH (ref 4.0–10.5)
nRBC: 0 % (ref 0.0–0.2)

## 2020-06-18 LAB — COMPREHENSIVE METABOLIC PANEL
ALT: 5 U/L (ref 0–44)
AST: 13 U/L — ABNORMAL LOW (ref 15–41)
Albumin: 2.1 g/dL — ABNORMAL LOW (ref 3.5–5.0)
Alkaline Phosphatase: 64 U/L (ref 38–126)
Anion gap: 13 (ref 5–15)
BUN: 34 mg/dL — ABNORMAL HIGH (ref 8–23)
CO2: 25 mmol/L (ref 22–32)
Calcium: 8.9 mg/dL (ref 8.9–10.3)
Chloride: 95 mmol/L — ABNORMAL LOW (ref 98–111)
Creatinine, Ser: 5.75 mg/dL — ABNORMAL HIGH (ref 0.44–1.00)
GFR, Estimated: 7 mL/min — ABNORMAL LOW (ref >60)
Glucose, Bld: 85 mg/dL (ref 70–99)
Potassium: 4.1 mmol/L (ref 3.5–5.1)
Sodium: 133 mmol/L — ABNORMAL LOW (ref 135–145)
Total Bilirubin: 0.6 mg/dL (ref 0.3–1.2)
Total Protein: 5 g/dL — ABNORMAL LOW (ref 6.5–8.1)

## 2020-06-18 LAB — MAGNESIUM: Magnesium: 2 mg/dL (ref 1.7–2.4)

## 2020-06-18 LAB — PHOSPHORUS: Phosphorus: 2.9 mg/dL (ref 2.5–4.6)

## 2020-06-18 MED ORDER — DOCUSATE SODIUM 100 MG PO CAPS
100.0000 mg | ORAL_CAPSULE | Freq: Two times a day (BID) | ORAL | Status: DC
  Administered 2020-06-18 – 2020-06-22 (×6): 100 mg via ORAL
  Filled 2020-06-18 (×10): qty 1

## 2020-06-18 MED ORDER — DOCUSATE SODIUM 100 MG PO CAPS: 100.0000 mg | ORAL_CAPSULE | Freq: Two times a day (BID) | ORAL | Status: DC

## 2020-06-18 NOTE — Progress Notes (Signed)
PROGRESS NOTE    [name]  UYQ:[id number] DOB: 08/27/1945 DOA: 06/07/2020 PCP: No primary care provider on file.   No chief complaint on file.  Brief Narrative:  74 year old female with history of dilated cardiomyopathy, EF of 20 to 25%, in setting of sepsis in 6387 complicated by demand ischemia, cardiac catheterization in 2014 showed normal coronaries, chronic diastolic CHF, PAF on Coumadin anticoagulation, moderate to severe aortic stenosis, tolerated TKR in/21 per cardiology may need consideration of AVR at some point, hyperlipidemia, essential hypertension, ESRD on TTS hemodialysis, GERD, migraine, glaucoma presented to the ED following mechanical fall at home with head injury and left hip pain.  In the ED she was found to be hypoxemic with O2 sats 82% on room air and placed on oxygen.  CT head was negative for acute abnormality.  X-ray of the hip showed acute left femoral neck fracture with superior subluxation.  Orthopedics was consulted and underwent repair on 06/09/2020.   Assessment & Plan:   Principal Problem:   Hip fracture ([place]) Active Problems:   Leukocytosis   Acute hypoxemic respiratory failure (HCC)   Atrial fibrillation (HCC)   ESRD (end stage renal disease) ([place])  1. Left femoral neck fracture: s/p left hemiarthroplasty on 06/09/2020.  Initially requested CIR however insurance will not cover.  Awaiting skilled nursing facility placement.  Pt and pt daughter requesting [name] to look into CIR again, discussed with SW.   2. Acute hypoxemic respiratory failure-secondary to acute on chronic diastolic CHF  1. patient on ESRD, required 2 L of oxygen, s/p HD -> now on RA 2. Most recent echo showed EF of 55 to 56%, grade 2 diastolic dysfunction.  3. C. difficile colonization, loose stool-patient has history of C. difficile, C. difficile PCR was checked however was found to have positive antigen but PCR toxin was negative.  Abdomen is soft, nontender, WBC is normal. She was  having intermittent loose stools. Now resolved per patient, asking for stool softener today. She has been on chronic Keflex for past 4 years.  At this time C. difficile appears to be Costantino Kohlbeck colonizer without active infection.  Continue probiotics. 1. Continue to monitor, w/u further as indicated   4. ESRD on TTS hemodialysis. Nephrology has been consulted.  Dialysis per nephrology.  5. Paroxysmal atrial fibrillation-patient has history of possible atrial fibrillation, she received vitamin K in preparation of surgery for supratherapeutic INR.   1. Continue warfarin per pharmacy  6. Hypertension-blood pressure is stable  7. Focal osseous irregularity-seen on the chest x-ray.  There is Kensley Lares focal osseous irregularity near the left coracoid process, could be from patient positioning. X-ray of the left shoulder unremarkable per report.  8. Recurrent UTIs -continue Keflex, patient has been on Keflex for past 4 years.  9. Leukocytosis: mild, follow, afebrile  DVT prophylaxis: warfarin  Code Status: full  Family Communication: daughter at bedside 12/30 Disposition:   Status is: Inpatient  Remains inpatient appropriate because:Inpatient level of care appropriate due to severity of illness   Dispo: The patient is from: Home              Anticipated d/c is to: pending              Anticipated d/c date is: > 3 days              Patient currently is not medically stable to d/c.   Consultants:   orthopedics  Procedures:  12/21 hemiarthroplasty   Antimicrobials:  Anti-infectives (From admission, onward)  Start     Dose/Rate Route Frequency Ordered Stop   06/11/20 1000  cephALEXin (KEFLEX) capsule 250 mg        250 mg Oral Daily 06/09/20 2154     06/09/20 2245  ceFAZolin (ANCEF) IVPB 2g/100 mL premix        2 g 200 mL/hr over 30 Minutes Intravenous Every 6 hours 06/09/20 2152 06/10/20 0550   06/09/20 1630  ceFAZolin (ANCEF) IVPB 2g/100 mL premix        2 g 200 mL/hr over 30 Minutes  Intravenous On call to O.R. 06/09/20 1616 06/09/20 1744   06/09/20 1619  ceFAZolin (ANCEF) 2-4 GM/100ML-% IVPB       Note to Pharmacy: [name]   : cabinet override      06/09/20 1619 06/09/20 1746   06/08/20 1245  cephALEXin (KEFLEX) capsule 250 mg  Status:  Discontinued        250 mg Oral Daily 06/08/20 1241 06/09/20 2154     Subjective: No nwe complaints today  Objective: Vitals:   06/17/20 2048 06/18/20 0341 06/18/20 0831 06/18/20 1438  BP: (!) 148/59 127/63 (!) 148/58 (!) 157/58  Pulse: 72 77 72 70  Resp: 17 16 (!) 22 18  Temp: 98.4 F (36.9 C) 97.8 F (36.6 C) 97.9 F (36.6 C) 97.7 F (36.5 C)  TempSrc: Oral  Oral Oral  SpO2: 95% 100% 93% 99%  Weight:      Height:        Intake/Output Summary (Last 24 hours) at 06/18/2020 1706 Last data filed at 06/18/2020 6063 Gross per 24 hour  Intake 240 ml  Output --  Net 240 ml   Filed Weights   06/16/20 1241 06/16/20 1648 06/17/20 0500  Weight: 79.8 kg 78.4 kg 79.1 kg    Examination:  General: No acute distress. Cardiovascular: Heart sounds show Ria Redcay regular rate, and rhythm. Lungs: Clear to auscultation bilaterally Abdomen: Soft, nontender, nondistended  Neurological: Alert and oriented 3. Moves all extremities 4. Cranial nerves II through XII grossly intact. Skin: Warm and dry. No rashes or lesions. Extremities: LLE dressing intact   Data Reviewed: I have personally reviewed following labs and imaging studies  CBC: Recent Labs  Lab 06/13/20 0155 06/14/20 0806 06/15/20 0308 06/16/20 1102 06/18/20 0228  WBC 8.0 9.1 9.3 10.8* 12.2*  NEUTROABS  --   --   --   --  9.8*  HGB 9.8* 9.3* 9.7* 10.7* 10.0*  HCT 32.0* 31.1* 30.2* 35.3* 32.3*  MCV 96.7 97.2 95.0 97.2 95.8  PLT 217 269 243 303 016    Basic Metabolic Panel: Recent Labs  Lab 06/11/20 2302 06/12/20 0615 06/13/20 0155 06/14/20 0803 06/15/20 0308 06/16/20 0450 06/16/20 1102 06/18/20 0228  NA 131*   < > 135 130* 136  --  131* 133*  K 3.6    < > 4.5 6.2* 3.5  --  3.9 4.1  CL 90*   < > 98 95* 96*  --  94* 95*  CO2 26   < > 28 23 28   --  25 25  GLUCOSE 113*   < > 87 83 87  --  94 85  BUN 40*   < > 24* 42* 20  --  37* 34*  CREATININE 5.64*   < > 4.33* 6.17* 4.13* 5.90* 6.19* 5.75*  CALCIUM 9.1   < > 8.8* 9.0 8.6*  --  8.8* 8.9  MG  --   --   --   --   --   --   --  2.0  PHOS 3.8  --  2.5 2.8  --   --  3.2 2.9   < > = values in this interval not displayed.    GFR: Estimated Creatinine Clearance: 8.2 mL/min (Shamari Lofquist) (by C-G formula based on SCr of 5.75 mg/dL (H)).  Liver Function Tests: Recent Labs  Lab 06/11/20 2302 06/13/20 0155 06/14/20 0803 06/16/20 1102 06/18/20 0228  AST  --   --   --   --  13*  ALT  --   --   --   --  5  ALKPHOS  --   --   --   --  64  BILITOT  --   --   --   --  0.6  PROT  --   --   --   --  5.0*  ALBUMIN 2.4* 2.4* 2.3* 2.4* 2.1*    CBG: No results for input(s): GLUCAP in the last 168 hours.   Recent Results (from the past 240 hour(s))  Urine Culture     Status: Abnormal   Collection Time: 06/09/20  6:00 PM   Specimen: Urine, Catheterized  Result Value Ref Range Status   Specimen Description URINE, CATHETERIZED  Final   Special Requests   Final    NONE Performed at [facility], [address] [place], [place] [address]    Culture MULTIPLE SPECIES PRESENT, SUGGEST RECOLLECTION (Neilan Rizzo)  Final   Report Status 06/10/2020 FINAL  Final  C Difficile Quick Screen (NO PCR Reflex)     Status: Abnormal   Collection Time: 06/10/20  1:38 PM   Specimen: STOOL  Result Value Ref Range Status   C Diff antigen POSITIVE (Kolleen [facility]) NEGATIVE Final   C Diff toxin NEGATIVE NEGATIVE Final   C Diff interpretation   Final    Results are indeterminate. Please contact the provider listed for your campus for C diff questions in [place].    Comment: Performed at [facility], [place] [address], [place], [place] [address]         Radiology Studies: No results found.      Scheduled Meds: . amiodarone   200 mg Oral Daily  . calcium acetate  667 mg Oral TID WC  . cephALEXin  250 mg Oral Daily  . feeding supplement (NEPRO CARB STEADY)  237 mL Oral BID BM  . latanoprost  1 drop Both Eyes QHS  . multivitamin  1 tablet Oral QHS  . saccharomyces boulardii  250 mg Oral Daily  . timolol  1 drop Both Eyes Daily  . warfarin  2.5 mg Oral Once per day on Sun Mon Wed Fri Sat   And  . warfarin  5 mg Oral Once per day on Tue Thu  . Warfarin - Pharmacist Dosing Inpatient   Does not apply q1600   Continuous Infusions: . ferric gluconate (FERRLECIT/NULECIT) IV 125 mg (06/16/20 1553)  . methocarbamol (ROBAXIN) IV       LOS: 11 days    Time spent: over 30 min    Fayrene Helper, MD [facility]   To contact the attending provider between 7A-7P or the covering provider during after hours 7P-7A, please log into the web site [email or web address] and access using universal [place] password for that web site. If you do not have the password, please call the hospital operator.  06/18/2020, 5:06 PM

## 2020-06-18 NOTE — Progress Notes (Signed)
[name] for warfarin  Indication:  Atrial fibrillation  Allergies  Allergen Reactions  . Percocet [Oxycodone-Acetaminophen] Nausea And Vomiting  . Vicodin [Hydrocodone-Acetaminophen] Nausea And Vomiting  . Chlorhexidine Rash    Pt states she gets rash similar to "sunburn"    Patient Measurements: Height: 5\' 2"  (157.5 cm) Weight: 79.1 kg (174 lb 6.1 oz) IBW/kg (Calculated) : 50.1  Vital Signs: Temp: 97.8 F (36.6 C) (12/30 0341) Temp Source: Oral (12/29 2048) BP: 127/63 (12/30 0341) Pulse Rate: 77 (12/30 0341)  Labs: Recent Labs    06/16/20 0450 06/16/20 1102 06/17/20 0434 06/18/20 0228  HGB  --  10.7*  --  10.0*  HCT  --  35.3*  --  32.3*  PLT  --  303  --  270  LABPROT  --   --  27.9*  --   INR  --   --  2.7*  --   CREATININE 5.90* 6.19*  --  5.75*    Estimated Creatinine Clearance: 8.2 mL/min (A) (by C-G formula based on SCr of 5.75 mg/dL (H)).   Medical History: History reviewed. No pertinent past medical history.  Assessment: 74 yr old female admitted on 06/07/20 with L femoral neck fracture; pt is S/P hemiarthroplasty.  Patient was taking warfarin at home prior to admission for atrial fibrillation (5 mg on Tues/Thurs, 2.5 mg on other days).  Warfarin was held for surgery, and pt was given vitamin K 5 mg IV X 1 on 12/19. Patient transitioned to heparin infusion in preparation for surgery. Heparin infusion now off and warfarin resumed 12/24.  INR remains in goal range on 12/29 at 2.7 (checking MWF). CBC stable with no bleeding noted.  On amiodarone PTA, continued during this admission.   Goal of Therapy:  INR 2-3   Monitor platelets by anticoagulation protocol: Yes   Plan:  - Warfarin 2.5 mg PO daily except 5 mg on Tue/Thu per home dosing - Check INR MWF - Monitor CBC, signs/symptoms of bleeding   Cristela Felt, PharmD Clinical Pharmacist  06/18/2020 8:27 AM

## 2020-06-18 NOTE — Progress Notes (Signed)
Inpatient Rehabilitation-Admissions Coordinator   CIR consult received. Met with pt bedside for rehab assessment. Pt reports she has been to [name] for rehab prior and would like to return. We reviewed current thearpy recommendations for SNF and how it is unlikely insurance would approve an IP Rehab stay given her current diagnosis and recommendations from therapy. With her permission, I spoke to her daughter about this as well. Both would still like to proceed with CIR and insist I begin the insurance auth case. Will begin insurance auth process for possible admit. Will follow up once there has been a determination.   Will update TOC team.   Raechel Ache, OTR/L  Rehab Admissions Coordinator  [phone] 06/18/2020 2:45 PM

## 2020-06-18 NOTE — PMR Pre-admission (Incomplete)
PMR Admission Coordinator Pre-Admission Assessment  Patient: [name] is an 74 y.o., female MRN: [id number] DOB: [birth year] Height: $RemoveBeforeDE'5\' 2"'[id number]$  (157.5 cm) Weight: 79.1 kg  Insurance Information HMO: yes    PPO:      PCP:      IPA:      80/20:      OTHER:  PRIMARY: BCBS of       Policy#: [id number]      Subscriber: patient CM Name: ***      Phone#: ***     Fax#: *** Pre-Cert#: ***     Employer:  Benefits:  Phone #: ***     Name: *** Eff. Date: 06/21/2019-still active     Deduct: $0 (does not have)      Out of Pocket Max: $4,200 ($4,200 met)      Life Max: NA CIR: $335/admission co-pay      SNF: *** Outpatient: ***     Co-Pay: *** Home Health: ***      Co-Pay: *** DME: ***     Co-Pay: *** Providers: *** SECONDARY: None      Policy#:      Phone#:   Financial Counselor:       Phone#:   The Therapist, art Information Summary" for patients in Inpatient Rehabilitation Facilities with attached "Privacy Act [place] Records" was provided and verbally reviewed with: {CHL IP Patient Family TF:[id number]}  Emergency Contact Information Contact Information    Name Relation Home Work [place] Daughter   [phone]      Current Medical History  Patient Admitting Diagnosis: Left femoral neck fracture  History of Present Illness: Pt is a 74 yo female with history of dilated cardiomyopathy, chornic diastolic CHF, PAF on coumadin, moderate to severe aortic stenosis, TKA, hyperlipidemia, essential HTN, ESRD with HD TTS, GERD, migraine, and gluacoma. Pt was admitted to [facility] on 12/19 following a mechanical fall at home with head injury and left hip pain. CT of head showed no acute abnormality, but pt was found to be hypoxemic in the ED with Sp02 82% on RA and placed on oxygen. X-ray of the hip showed an acute left femoral neck fracture with superior subluxation. Ortho was consulted and pt underwent a repair on 06/09/2020. Pt has now progressed to room air and most  recent echo showed EF of 55-60%. Pt has history of C. Diff and pt was having intermittent loose stools but has now resolved. Pt remains on dialysis TTS. Pt is to continue Warfarin per pharmacy. Pt has been evaluated by therapy with recommendation for SNF/CIR. Pt's family is insisting on CIR for short term rehab.     Patient's medical record from [facility] has been reviewed by the rehabilitation admission coordinator and physician.  Past Medical History  History reviewed. No pertinent past medical history.  Family History   family history is not on file.  Prior Rehab/Hospitalizations Has the patient had prior rehab or hospitalizations prior to admission? Yes  Has the patient had major surgery during 100 days prior to admission? Yes   Current Medications  Current Facility-Administered Medications:  .  acetaminophen (TYLENOL) tablet 650 mg, 650 mg, Oral, Q6H PRN, Ventura Bruns, PA-C, 650 mg at 06/18/20 1624 .  amiodarone (PACERONE) tablet 200 mg, 200 mg, Oral, Daily, Merlene Pulling K, PA-C, 200 mg at 06/18/20 1625 .  calcium acetate (PHOSLO) capsule 667 mg, 667 mg, Oral, TID WC, Valentina Gu, NP, 667 mg at 06/18/20 1625 .  cephALEXin (  KEFLEX) capsule 250 mg, 250 mg, Oral, Daily, Cruzita Lederer, Costin M, MD, 250 mg at 06/18/20 1009 .  feeding supplement (NEPRO CARB STEADY) liquid 237 mL, 237 mL, Oral, BID BM, Loren Racer, PA-C, 237 mL at 06/18/20 1011 .  ferric gluconate (NULECIT) 125 mg in sodium chloride 0.9 % 100 mL IVPB, 125 mg, Intravenous, Q T,Th,Sa-HD, Valentina Gu, NP, Last Rate: 110 mL/hr at 06/16/20 1553, 125 mg at 06/16/20 1553 .  hydrALAZINE (APRESOLINE) injection 5 mg, 5 mg, Intravenous, Q4H PRN, Merlene Pulling K, PA-C .  HYDROmorphone (DILAUDID) injection 0.5-1 mg, 0.5-1 mg, Intravenous, Q4H PRN, Merlene Pulling K, PA-C, 1 mg at 06/12/20 1005 .  latanoprost (XALATAN) 0.005 % ophthalmic solution 1 drop, 1 drop, Both Eyes, QHS, Brown, Dawson K,  PA-C, 1 drop at 06/17/20 2059 .  loperamide (IMODIUM) capsule 2 mg, 2 mg, Oral, QID PRN, Caren Griffins, MD, 2 mg at 06/16/20 1852 .  menthol-cetylpyridinium (CEPACOL) lozenge 3 mg, 1 lozenge, Oral, PRN **OR** phenol (CHLORASEPTIC) mouth spray 1 spray, 1 spray, Mouth/Throat, PRN, Owens Shark, Blaine K, PA-C .  methocarbamol (ROBAXIN) tablet 500 mg, 500 mg, Oral, Q6H PRN, 500 mg at 06/17/20 0942 **OR** methocarbamol (ROBAXIN) 500 mg in dextrose 5 % 50 mL IVPB, 500 mg, Intravenous, Q6H PRN, Owens Shark, Blaine K, PA-C .  multivitamin (RENA-VIT) tablet 1 tablet, 1 tablet, Oral, QHS, Ventura Bruns, PA-C, 1 tablet at 06/17/20 2103 .  ondansetron (ZOFRAN) tablet 4 mg, 4 mg, Oral, Q6H PRN **OR** ondansetron (ZOFRAN) injection 4 mg, 4 mg, Intravenous, Q6H PRN, Owens Shark, Blaine K, PA-C .  oxyCODONE (Oxy IR/ROXICODONE) immediate release tablet 10-15 mg, 10-15 mg, Oral, Q4H PRN, Owens Shark, Blaine K, PA-C .  oxyCODONE (Oxy IR/ROXICODONE) immediate release tablet 5-10 mg, 5-10 mg, Oral, Q4H PRN, Owens Shark, Blaine K, PA-C .  saccharomyces boulardii (FLORASTOR) capsule 250 mg, 250 mg, Oral, Daily, Merlene Pulling K, PA-C, 250 mg at 06/18/20 1009 .  timolol (TIMOPTIC) 0.5 % ophthalmic solution 1 drop, 1 drop, Both Eyes, Daily, Brown, Blaine K, PA-C, 1 drop at 06/18/20 1010 .  warfarin (COUMADIN) tablet 2.5 mg, 2.5 mg, Oral, Once per day on Sun Mon Wed Fri Sat, 2.5 mg at 06/17/20 1606 **AND** warfarin (COUMADIN) tablet 5 mg, 5 mg, Oral, Once per day on Tue Thu, Theresa, Jennifer D, Williams, 5 mg at 06/18/20 1624 .  Warfarin - Pharmacist Dosing Inpatient, , Does not apply, q1600, Caren Griffins, MD, Given at 06/18/20 1631  Patients Current Diet:  Diet Order            Diet renal with fluid restriction Fluid restriction: 1200 mL Fluid; Room service appropriate? Yes; Fluid consistency: Thin  Diet effective now                 Precautions / Restrictions Precautions Precautions: Fall,Posterior Hip Precaution Booklet Issued: Yes  (comment) Precaution Comments: pt able to state 2/3 precautions, required cues for no internal rotation Restrictions Weight Bearing Restrictions: Yes LLE Weight Bearing: Weight bearing as tolerated   Has the patient had 2 or more falls or a fall with injury in the past year? Yes  Prior Activity Level Community (5-7x/wk): active daily, but still required supervision at times depending on the day ; used RW and single point cane for ambulaiton  Prior Functional Level Self Care: Did the patient need help bathing, dressing, using the toilet or eating? Independent  Indoor Mobility: Did the patient need assistance with walking from room to room (with or without device)?  Independent  Stairs: Did the patient need assistance with internal or external stairs (with or without device)? Needed some help  Functional Cognition: Did the patient need help planning regular tasks such as shopping or remembering to take medications? Independent  Home Assistive Devices / Equipment Home Assistive Devices/Equipment: Cane (specify quad or straight) (sometimes when going out) Home Equipment: Grab bars - tub/shower,Cane - single point,Walker - 4 wheels  Prior Device Use: Indicate devices/aids used by the patient prior to current illness, exacerbation or injury? Walker and single point cane  Current Functional Level Cognition  Overall Cognitive Status: Within Functional Limits for tasks assessed Orientation Level: Oriented X4 General Comments: very pleasant and motivated to do more each session    Extremity Assessment (includes Sensation/Coordination)  Upper Extremity Assessment: Overall WFL for tasks assessed  Lower Extremity Assessment: Defer to PT evaluation RLE Deficits / Details: ROM WFL; MMT 5/5 LLE Deficits / Details: ROM within hip precautions; MMT: ankle 5/5, knee 3/5, hip 1/5    ADLs  Overall ADL's : Needs assistance/impaired Eating/Feeding: Set up,Sitting Grooming: Set up,Sitting Upper Body  Bathing: Set up,Sitting Lower Body Bathing: Maximal assistance,Sit to/from stand Upper Body Dressing : Minimal assistance,Sitting Upper Body Dressing Details (indicate cue type and reason): to don gown as back side cover Lower Body Dressing: Sit to/from stand,Total assistance Toilet Transfer: Minimal assistance,RW,Grab International aid/development worker Details (indicate cue type and reason): pt able to ambulate to bathroom for toileting with minguard and RW needing MIN A to ascend <>descend on toilet with cues to utilize grab bars Toileting- Water quality scientist and Hygiene: Total assistance,Sit to/from stand Toileting - Clothing Manipulation Details (indicate cue type and reason): total A for posterior pericare secondary to increased pain Functional mobility during ADLs: Minimal assistance,Min guard,Rolling walker General ADL Comments: pt continues to present with pain, decreassed activity tolerance and hip precautions impacting pts ability to complete BADL independently    Mobility  Overal bed mobility: Needs Assistance Bed Mobility: Supine to Sit Supine to sit: Min guard,HOB elevated General bed mobility comments: OOB in recliner    Transfers  Overall transfer level: Needs assistance Equipment used: Rolling walker (2 wheeled) Transfers: Sit to/from Stand Sit to Stand: Min guard,Min assist General transfer comment: pt sit<>stand x3 from recliner and toilet, MIN guard from recliner needing cues for hand placement and overall technique, MIN A for toilet needing cues to utilize grab bars    Ambulation / Gait / Stairs / Wheelchair Mobility  Ambulation/Gait Ambulation/Gait assistance: Herbalist (Feet): 50 Feet Assistive device: Rolling walker (2 wheeled) Gait Pattern/deviations: Step-to pattern,Decreased stride length,Decreased step length - right,Decreased weight shift to left,Antalgic,Step-through pattern,Decreased stance time - left General Gait Details: Slow, step to  gait initially with cues for step through gait; heavy reliance on UEs on RW. Cues to pick up feet during turns to adhere to precautions. 2 standing rest breaks due to sore UEs. Gait velocity: decreased    Posture / Balance Dynamic Sitting Balance Sitting balance - Comments: At times propping with UEs - more due to fatigue or offloading than balance. Balance Overall balance assessment: Needs assistance Sitting-balance support: Feet supported,No upper extremity supported Sitting balance-Leahy Scale: Fair Sitting balance - Comments: At times propping with UEs - more due to fatigue or offloading than balance. Standing balance support: Bilateral upper extremity supported,During functional activity Standing balance-Leahy Scale: Poor Standing balance comment: Requires UE support in standing with RW.    Special needs/care consideration Continuous Drip IV: ferric gluconate   Dialysis:  Hemodialysis Tuesday, Thursday and Saturday,   Skin: surgical incision to L hip    Previous Home Environment (from acute therapy documentation) Living Arrangements: Children (daughter) Available Help at Discharge: Family,Available 24 hours/day Type of Home: House Home Layout: One level Home Access: Stairs to enter Entrance Stairs-Rails: None Entrance Stairs-Number of Steps: Step up onto porch Bathroom Shower/Tub: Engineer, manufacturing systems: Handicapped height Home Care Services: No  Discharge Living Setting Plans for Discharge Living Setting: Patient's home,Lives with (comment) (daughter) Type of Home at Discharge: House Discharge Home Layout: One level Discharge Home Access: Level entry Discharge Bathroom Shower/Tub: Tub/shower unit Discharge Bathroom Toilet: Handicapped height Discharge Bathroom Accessibility: Yes How Accessible: Accessible via walker Does the patient have any problems obtaining your medications?: No  Social/Family/Support Systems Patient Roles:  (lives with daughter) Contact  Information: daughter: Marcelino Duster [phone] Anticipated Caregiver: daughter Anticipated Caregiver's Contact Information: see above Ability/Limitations of Caregiver: supervision Caregiver Availability: Intermittent Discharge Plan Discussed with Primary Caregiver: Yes Is Caregiver In Agreement with Plan?: Yes Does Caregiver/Family have Issues with Lodging/Transportation while Pt is in Rehab?: No  Goals Patient/Family Goal for Rehab: PT/OT: Mod I/Supervision Expected length of stay: 6-9 days Pt/Family Agrees to Admission and willing to participate: Yes Program Orientation Provided & Reviewed with Pt/Caregiver Including Roles  & Responsibilities: Yes  Barriers to Discharge: Home environment access/layout,Lack of/limited family support,Hemodialysis  Barriers to Discharge Comments: bathroom is not RW accessible. daughter has an unpredictable schedule and can only do intermittant supervision  Decrease burden of Care through IP rehab admission: Other NA  Possible need for SNF placement upon discharge: Not anticipated; pt has intermittant support form her daughter and anticipate pt can return [facility] at [facility] I/Supervision level at DC after CIR stay.   Patient Condition: I have reviewed medical records from [facility], spoken with MD , and patient and daughter. I met with patient at the bedside for inpatient rehabilitation assessment.  Patient will benefit from ongoing PT and OT, can actively participate in 3 hours of therapy a day 5 days of the week, and can make measurable gains during the admission.  Patient will also benefit from the coordinated team approach during an Inpatient Acute Rehabilitation admission.  The patient will receive intensive therapy as well as Rehabilitation physician, nursing, social worker, and care management interventions.  Due to safety, skin/wound care, disease management, medication administration, pain management and patient education the patient requires 24  hour a day rehabilitation nursing.  The patient is currently Min A with mobility and Min A to Total A for basic ADLs.  Discharge setting and therapy post discharge at home with home health is anticipated.  Patient has agreed to participate in the Acute Inpatient Rehabilitation Program and will admit ***.  Preadmission Screen Completed By:  Cheri Rous, 06/18/2020 5:07 PM ______________________________________________________________________   Discussed status with Dr. Marland Kitchen on *** at *** and received approval for admission today.  Admission Coordinator:  Cheri Rous, OT, time ***Dorna Bloom ***   Assessment/Plan: Diagnosis: 1. Does the need for close, 24 hr/day Medical supervision in concert with the patient's rehab needs make it unreasonable for this patient to be served in a less intensive setting? {yes_no_potentially:3041433} 2. Co-Morbidities requiring supervision/potential complications: *** 3. Due to {due WV:3710626}, does the patient require 24 hr/day rehab nursing? {yes_no_potentially:3041433} 4. Does the patient require coordinated care of a physician, rehab nurse, PT, OT, and SLP to address physical and functional deficits in the context of the above medical diagnosis(es)? {yes_no_potentially:3041433} Addressing deficits in the  following areas: {deficits:3041436} 5. Can the patient actively participate in an intensive therapy program of at least 3 hrs of therapy 5 days a week? {yes_no_potentially:3041433} 6. The potential for patient to make measurable gains while on inpatient rehab is {potential:3041437} 7. Anticipated functional outcomes upon discharge from inpatient rehab: {functional outcomes:304600100} PT, {functional outcomes:304600100} OT, {functional outcomes:304600100} SLP 8. Estimated rehab length of stay to reach the above functional goals is: *** 9. Anticipated discharge destination: {anticipated dc setting:21604} 10. Overall Rehab/Functional Prognosis: {potential:3041437}   MD  Signature: ***

## 2020-06-18 NOTE — Progress Notes (Signed)
[facility] Progress Note   Subjective:  Seen in room - no overnight issues. No CP/dyspnea. For HD today.  Objective Vitals:   06/17/20 1703 06/17/20 2048 06/18/20 0341 06/18/20 0831  BP: (!) 155/54 (!) 148/59 127/63 (!) 148/58  Pulse: 70 72 77 72  Resp: 16 17 16  (!) 22  Temp: 97.8 F (36.6 C) 98.4 F (36.9 C) 97.8 F (36.6 C) 97.9 F (36.6 C)  TempSrc: Oral Oral  Oral  SpO2: 98% 95% 100% 93%  Weight:      Height:       Physical Exam General:Well appearing woman, NAD. Room air. Heart:RRR; 3/6 murmur Lungs:CTA anteriorly Abdomen:soft Extremities:No LE edema Dialysis Access:L AVF + thrill  Additional Objective Labs: Basic Metabolic Panel: Recent Labs  Lab 06/14/20 0803 06/15/20 0308 06/16/20 0450 06/16/20 1102 06/18/20 0228  NA 130* 136  --  131* 133*  K 6.2* 3.5  --  3.9 4.1  CL 95* 96*  --  94* 95*  CO2 23 28  --  25 25  GLUCOSE 83 87  --  94 85  BUN 42* 20  --  37* 34*  CREATININE 6.17* 4.13* 5.90* 6.19* 5.75*  CALCIUM 9.0 8.6*  --  8.8* 8.9  PHOS 2.8  --   --  3.2 2.9   Liver Function Tests: Recent Labs  Lab 06/14/20 0803 06/16/20 1102 06/18/20 0228  AST  --   --  13*  ALT  --   --  5  ALKPHOS  --   --  64  BILITOT  --   --  0.6  PROT  --   --  5.0*  ALBUMIN 2.3* 2.4* 2.1*   CBC: Recent Labs  Lab 06/13/20 0155 06/14/20 0806 06/15/20 0308 06/16/20 1102 06/18/20 0228  WBC 8.0 9.1 9.3 10.8* 12.2*  NEUTROABS  --   --   --   --  9.8*  HGB 9.8* 9.3* 9.7* 10.7* 10.0*  HCT 32.0* 31.1* 30.2* 35.3* 32.3*  MCV 96.7 97.2 95.0 97.2 95.8  PLT 217 269 243 303 270   Medications: . ferric gluconate (FERRLECIT/NULECIT) IV 125 mg (06/16/20 1553)  . methocarbamol (ROBAXIN) IV     . amiodarone  200 mg Oral Daily  . calcium acetate  667 mg Oral TID WC  . cephALEXin  250 mg Oral Daily  . feeding supplement (NEPRO CARB STEADY)  237 mL Oral BID BM  . latanoprost  1 drop Both Eyes QHS  . multivitamin  1 tablet Oral QHS  .  saccharomyces boulardii  250 mg Oral Daily  . timolol  1 drop Both Eyes Daily  . warfarin  2.5 mg Oral Once per day on Sun Mon Wed Fri Sat   And  . warfarin  5 mg Oral Once per day on Tue Thu  . Warfarin - Pharmacist Dosing Inpatient   Does not apply q1600    Dialysis Orders: TTS @ [place] 4 hrs 180NRe 400/800 77.5 kg 2.0 K/ 2.0 Ca UFP 4 AVF Heparin 1000 units IV TIW - Mircera 150 mcg IV q 2 weeks (last dose 05/28/2020 last HGB 10.6 06/04/2020) - Venofer 100 mg IV X 10 doses (1/10 doses given) Tsat 19 05/28/2020.  Assessment/Plan: 1. L Hip fractures/p fall: S/p L hiphemiarthroplasty12/21/2021. Per primary/Orthopedics.  2. Acute on chronic diastolic HF- Last WC58 to 55% 06/08/2020.Hadevidence of mild volume overload by exam/CXR 12/20.Volume excess improved post HD.  3. ESRD: Continue HD per TTS schedule- HD today.Issues with cramping. Lowered machine  temp-change to UFP 2 which has helped.Please continue these orders on DC. 4. Hypertension - BPreasonably controlled without meds. No edema. 5. Anemia: Hgb 10 -Aranesp 25 mcggiven 12/26 - follow for further need.She is finishing course of IV iron. 6. Metabolic bone disease: CorrCa ok, Phos was low - Phoslo dose reduced to 1/meals. 7. Nutrition - Albuminlow, restarted Nepro. 8. PAF-on coumadin. Management per primary/pharmacy.  9. Prolonged QTc in setting of RBBB-no benadryl or agents that could increase QTc   [name], PA-C 06/18/2020, 10:03 AM  [employer]

## 2020-06-18 NOTE — Progress Notes (Signed)
Physical Therapy Treatment Patient Details Name: [name] MRN: [id number] DOB: 1945-07-21 Today's Date: 06/18/2020    History of Present Illness Pt is a 74 y.o. female admitted 06/07/20 after fall sustaining L hip femoral neck fx. S/p posterior hemiarthroplasty 12/21. PMH includes ESRD (HD TTS), CHF, PAF, HTN, L TKA (10/2019).    PT Comments    Pt supine on arrival, c/o fatigue after recently ambulating to/from bathroom, but agreeable to bed-level therapy session. Pt performed supine LLE A/AAROM therapeutic exercises with good tolerance, needing AA for SLR due to LLE pain/weakness. Pt able to verbalize 3/3 hip precautions this date. Pt deferred EOB/OOB mobility despite encouragement. Pt continues to benefit from skilled rehab in a post acute setting to maximize functional gains before returning home.   Follow Up Recommendations  SNF;Supervision for mobility/OOB     Equipment Recommendations  3in1 (PT);Rolling walker with 5" wheels    Recommendations for Other Services       Precautions / Restrictions Precautions Precautions: Fall;Posterior Hip Precaution Booklet Issued: Yes (comment) Precaution Comments: pt able to state 3/3 precautions this session Restrictions Weight Bearing Restrictions: Yes LLE Weight Bearing: Weight bearing as tolerated    Mobility  Bed Mobility               General bed mobility comments: pt defers EOB/OOB mobility this date  Transfers                    Ambulation/Gait                 Stairs             Wheelchair Mobility    Modified Rankin (Stroke Patients Only)       Balance                                            Cognition Arousal/Alertness: Awake/alert Behavior During Therapy: WFL for tasks assessed/performed Overall Cognitive Status: Within Functional Limits for tasks assessed                                 General Comments: anxious and pain limited this date  but cooperative as able; per pt, she had tylenol prior to session but pain not controlled      Exercises Total Joint Exercises Ankle Circles/Pumps: AROM;Both;10 reps;Supine Gluteal Sets: AROM;Strengthening;Both;10 reps;Supine Short Arc Quad: AAROM;Left;10 reps;Supine Heel Slides: AAROM;Left;10 reps;Supine Hip ABduction/ADduction: AAROM;Left;10 reps;Supine Straight Leg Raises: AAROM;Left;10 reps;Supine    General Comments        Pertinent Vitals/Pain Pain Assessment: 0-10 Pain Score: 8  Pain Location: Lft hip Pain Descriptors / Indicators: Discomfort;Sore;Moaning;Grimacing Pain Intervention(s): Monitored during session;Premedicated before session;Ice applied;Repositioned;Limited activity within patient's tolerance    Home Living                      Prior Function            PT Goals (current goals can now be found in the care plan section) Acute Rehab PT Goals Patient Stated Goal: To get stronger and get back to independence PT Goal Formulation: With patient/family Time For Goal Achievement: 06/24/20 Potential to Achieve Goals: Good Progress towards PT goals: Progressing toward goals    Frequency    Min 3X/week      PT Plan Current  plan remains appropriate    Co-evaluation              AM-PAC PT "6 Clicks" Mobility   Outcome Measure  Help needed turning from your back to your side while in a flat bed without using bedrails?: A Little Help needed moving from lying on your back to sitting on the side of a flat bed without using bedrails?: A Little Help needed moving to and from a bed to a chair (including a wheelchair)?: A Little Help needed standing up from a chair using your arms (e.g., wheelchair or bedside chair)?: A Lot Help needed to walk in hospital room?: A Lot Help needed climbing 3-5 steps with a railing? : A Lot 6 Click Score: 15    End of Session   Activity Tolerance: Patient limited by pain;Patient limited by fatigue Patient  left: in bed;with call bell/phone within reach;with bed alarm set Nurse Communication: Mobility status PT Visit Diagnosis: Unsteadiness on feet (R26.81);Muscle weakness (generalized) (M62.81);Pain Pain - Right/Left: Left Pain - part of body: Hip     Time: 3704-8889 PT Time Calculation (min) (ACUTE ONLY): 12 min  Charges:  $Therapeutic Exercise: 8-22 mins                     Lakyn Alsteen P., PTA Acute Rehabilitation Services Pager: [phone] Office: [place] 06/18/2020, 6:16 PM

## 2020-06-19 DIAGNOSIS — I129 Hypertensive chronic kidney disease with stage 1 through stage 4 chronic kidney disease, or unspecified chronic kidney disease: Secondary | ICD-10-CM | POA: Diagnosis not present

## 2020-06-19 DIAGNOSIS — Z992 Dependence on renal dialysis: Secondary | ICD-10-CM | POA: Diagnosis not present

## 2020-06-19 DIAGNOSIS — N186 End stage renal disease: Secondary | ICD-10-CM | POA: Diagnosis not present

## 2020-06-19 DIAGNOSIS — S72002D Fracture of unspecified part of neck of left femur, subsequent encounter for closed fracture with routine healing: Secondary | ICD-10-CM | POA: Diagnosis not present

## 2020-06-19 LAB — COMPREHENSIVE METABOLIC PANEL
ALT: 5 U/L (ref 0–44)
AST: 13 U/L — ABNORMAL LOW (ref 15–41)
Albumin: 2.1 g/dL — ABNORMAL LOW (ref 3.5–5.0)
Alkaline Phosphatase: 76 U/L (ref 38–126)
Anion gap: 13 (ref 5–15)
BUN: 48 mg/dL — ABNORMAL HIGH (ref 8–23)
CO2: 26 mmol/L (ref 22–32)
Calcium: 9.1 mg/dL (ref 8.9–10.3)
Chloride: 94 mmol/L — ABNORMAL LOW (ref 98–111)
Creatinine, Ser: 7.87 mg/dL — ABNORMAL HIGH (ref 0.44–1.00)
GFR, Estimated: 5 mL/min — ABNORMAL LOW (ref >60)
Glucose, Bld: 79 mg/dL (ref 70–99)
Potassium: 4.5 mmol/L (ref 3.5–5.1)
Sodium: 133 mmol/L — ABNORMAL LOW (ref 135–145)
Total Bilirubin: 1 mg/dL (ref 0.3–1.2)
Total Protein: 5.1 g/dL — ABNORMAL LOW (ref 6.5–8.1)

## 2020-06-19 LAB — CBC
HCT: 32.3 % — ABNORMAL LOW (ref 36.0–46.0)
HCT: 30 % — ABNORMAL LOW (ref 36.0–46.0)
Hemoglobin: 10 g/dL — ABNORMAL LOW (ref 12.0–15.0)
Hemoglobin: 9.8 g/dL — ABNORMAL LOW (ref 12.0–15.0)
MCH: 29.2 pg (ref 26.0–34.0)
MCH: 30.4 pg (ref 26.0–34.0)
MCHC: 31 g/dL (ref 30.0–36.0)
MCHC: 32.7 g/dL (ref 30.0–36.0)
MCV: 94.2 fL (ref 80.0–100.0)
MCV: 93.2 fL (ref 80.0–100.0)
Platelets: 318 10*3/uL (ref 150–400)
Platelets: 303 10*3/uL (ref 150–400)
RBC: 3.43 MIL/uL — ABNORMAL LOW (ref 3.87–5.11)
RBC: 3.22 MIL/uL — ABNORMAL LOW (ref 3.87–5.11)
RDW: 16.8 % — ABNORMAL HIGH (ref 11.5–15.5)
RDW: 16.9 % — ABNORMAL HIGH (ref 11.5–15.5)
WBC: 11.3 10*3/uL — ABNORMAL HIGH (ref 4.0–10.5)
WBC: 11.2 10*3/uL — ABNORMAL HIGH (ref 4.0–10.5)
nRBC: 0 % (ref 0.0–0.2)
nRBC: 0 % (ref 0.0–0.2)

## 2020-06-19 LAB — RENAL FUNCTION PANEL
Albumin: 2.2 g/dL — ABNORMAL LOW (ref 3.5–5.0)
Anion gap: 15 (ref 5–15)
BUN: 50 mg/dL — ABNORMAL HIGH (ref 8–23)
CO2: 23 mmol/L (ref 22–32)
Calcium: 8.9 mg/dL (ref 8.9–10.3)
Chloride: 94 mmol/L — ABNORMAL LOW (ref 98–111)
Creatinine, Ser: 7.84 mg/dL — ABNORMAL HIGH (ref 0.44–1.00)
GFR, Estimated: 5 mL/min — ABNORMAL LOW (ref >60)
Glucose, Bld: 109 mg/dL — ABNORMAL HIGH (ref 70–99)
Phosphorus: 3.5 mg/dL (ref 2.5–4.6)
Potassium: 4.5 mmol/L (ref 3.5–5.1)
Sodium: 132 mmol/L — ABNORMAL LOW (ref 135–145)

## 2020-06-19 LAB — PROTIME-INR
INR: 3.9 — ABNORMAL HIGH (ref 0.8–1.2)
Prothrombin Time: 36.9 seconds — ABNORMAL HIGH (ref 11.4–15.2)

## 2020-06-19 LAB — MAGNESIUM: Magnesium: 2.1 mg/dL (ref 1.7–2.4)

## 2020-06-19 LAB — PHOSPHORUS: Phosphorus: 3.5 mg/dL (ref 2.5–4.6)

## 2020-06-19 NOTE — Plan of Care (Signed)

## 2020-06-19 NOTE — Progress Notes (Signed)
PROGRESS NOTE    [name]  PYK:[id number] DOB: 1945/08/08 DOA: 06/07/2020 PCP: No primary care provider on file.   No chief complaint on file.  Brief Narrative:  74 year old female with history of dilated cardiomyopathy, EF of 20 to 25%, in setting of sepsis in 5397 complicated by demand ischemia, cardiac catheterization in 2014 showed normal coronaries, chronic diastolic CHF, PAF on Coumadin anticoagulation, moderate to severe aortic stenosis, tolerated TKR in/21 per cardiology may need consideration of AVR at some point, hyperlipidemia, essential hypertension, ESRD on TTS hemodialysis, GERD, migraine, glaucoma presented to the ED following mechanical fall at home with head injury and left hip pain.  In the ED she was found to be hypoxemic with O2 sats 82% on room air and placed on oxygen.  CT head was negative for acute abnormality.  X-ray of the hip showed acute left femoral neck fracture with superior subluxation.  Orthopedics was consulted and underwent repair on 06/09/2020.   Assessment & Plan:   Principal Problem:   Hip fracture ([place]) Active Problems:   Leukocytosis   Acute hypoxemic respiratory failure (HCC)   Atrial fibrillation (HCC)   ESRD (end stage renal disease) ([place])  1. Left femoral neck fracture: s/p left hemiarthroplasty on 06/09/2020.  Initially requested CIR however insurance will not cover.  Awaiting skilled nursing facility placement.  Pt and pt daughter requesting [name] to look into CIR again, discussed with SW. 1. Awaiting insurance auth for inpatient rehab   2. Acute hypoxemic respiratory failure-secondary to acute on chronic diastolic CHF  1. patient on ESRD, required 2 L of oxygen, s/p HD -> now on RA 2. Most recent echo showed EF of 55 to 67%, grade 2 diastolic dysfunction.  3. C. difficile colonization, loose stool-patient has history of C. difficile, C. difficile PCR was checked however was found to have positive antigen but PCR toxin was negative.   Abdomen is soft, nontender, WBC is normal. She was having intermittent loose stools. Now resolved per patient, asking for stool softener today. She has been on chronic Keflex for past 4 years.  At this time C. difficile appears to be [name] colonizer without active infection.  Continue probiotics. 1. Continue to monitor, w/u further as indicated   4. ESRD on TTS hemodialysis. Nephrology has been consulted.  Dialysis per nephrology.  5. Paroxysmal atrial fibrillation-patient has history of possible atrial fibrillation, she received vitamin K in preparation of surgery for supratherapeutic INR.   1. Continue warfarin per pharmacy - supratherapeutic today, hold warfarin tonight per pharmacy  6. Hypertension-blood pressure is stable  7. Focal osseous irregularity-seen on the chest x-ray.  There is [name] focal osseous irregularity near the left coracoid process, could be from patient positioning. X-ray of the left shoulder unremarkable per report.  8. Recurrent UTIs -continue Keflex, patient has been on Keflex for past 4 years.  9. Leukocytosis: mild, follow, afebrile  DVT prophylaxis: warfarin  Code Status: full  Family Communication: daughter at bedside 12/30 Disposition:   Status is: Inpatient  Remains inpatient appropriate because:Inpatient level of care appropriate due to severity of illness   Dispo: The patient is from: Home              Anticipated d/c is to: pending              Anticipated d/c date is: > 3 days              Patient currently is not medically stable to d/c.   Consultants:  orthopedics  Procedures:  12/21 hemiarthroplasty   Antimicrobials:  Anti-infectives (From admission, onward)   Start     Dose/Rate Route Frequency Ordered Stop   06/11/20 1000  cephALEXin (KEFLEX) capsule 250 mg        250 mg Oral Daily 06/09/20 2154     06/09/20 2245  ceFAZolin (ANCEF) IVPB 2g/100 mL premix        2 g 200 mL/hr over 30 Minutes Intravenous Every 6 hours 06/09/20 2152  06/10/20 0550   06/09/20 1630  ceFAZolin (ANCEF) IVPB 2g/100 mL premix        2 g 200 mL/hr over 30 Minutes Intravenous On call to O.R. 06/09/20 1616 06/09/20 1744   06/09/20 1619  ceFAZolin (ANCEF) 2-4 GM/100ML-% IVPB       Note to Pharmacy: [name]   : cabinet override      06/09/20 1619 06/09/20 1746   06/08/20 1245  cephALEXin (KEFLEX) capsule 250 mg  Status:  Discontinued        250 mg Oral Daily 06/08/20 1241 06/09/20 2154     Subjective: No new complaints  Objective: Vitals:   06/19/20 1056 06/19/20 1058 06/19/20 1253 06/19/20 1523  BP: (!) 116/52 (!) 126/54 (!) 117/52 (!) 149/56  Pulse: 71 71 73 75  Resp:   18 17  Temp:  97.9 F (36.6 C) 97.8 F (36.6 C) 97.7 F (36.5 C)  TempSrc:  Oral Oral Oral  SpO2:  100% 92%   Weight:      Height:        Intake/Output Summary (Last 24 hours) at 06/19/2020 1529 Last data filed at 06/19/2020 1142 Gross per 24 hour  Intake --  Output 2001 ml  Net -2001 ml   Filed Weights   06/16/20 1648 06/17/20 0500 06/19/20 0800  Weight: 78.4 kg 79.1 kg 83.8 kg    Examination:  General: No acute distress. Cardiovascular: Heart sounds show [name] regular rate, and rhythm Lungs: Clear to auscultation bilaterally  Abdomen: Soft, nontender, nondistended Neurological: Alert and oriented 3. Moves all extremities 4. Cranial nerves II through XII grossly intact. Skin: Warm and dry. No rashes or lesions. Extremities: LLE with dressing intact    Data Reviewed: I have personally reviewed following labs and imaging studies  CBC: Recent Labs  Lab 06/15/20 0308 06/16/20 1102 06/18/20 0228 06/19/20 0609 06/19/20 0824  WBC 9.3 10.8* 12.2* 11.3* 11.2*  NEUTROABS  --   --  9.8*  --   --   HGB 9.7* 10.7* 10.0* 10.0* 9.8*  HCT 30.2* 35.3* 32.3* 32.3* 30.0*  MCV 95.0 97.2 95.8 94.2 93.2  PLT 243 303 270 318 245    Basic Metabolic Panel: Recent Labs  Lab 06/14/20 0803 06/15/20 0308 06/16/20 0450 06/16/20 1102 06/18/20 0228  06/19/20 0609 06/19/20 0823  NA 130* 136  --  131* 133* 133* 132*  K 6.2* 3.5  --  3.9 4.1 4.5 4.5  CL 95* 96*  --  94* 95* 94* 94*  CO2 23 28  --  25 25 26 23   GLUCOSE 83 87  --  94 85 79 109*  BUN 42* 20  --  37* 34* 48* 50*  CREATININE 6.17* 4.13* 5.90* 6.19* 5.75* 7.87* 7.84*  CALCIUM 9.0 8.6*  --  8.8* 8.9 9.1 8.9  MG  --   --   --   --  2.0 2.1  --   PHOS 2.8  --   --  3.2 2.9 3.5 3.5  GFR: Estimated Creatinine Clearance: 6.2 mL/min ([place]) (by C-G formula based on SCr of 7.84 mg/dL (H)).  Liver Function Tests: Recent Labs  Lab 06/14/20 0803 06/16/20 1102 06/18/20 0228 06/19/20 0609 06/19/20 0823  AST  --   --  13* 13*  --   ALT  --   --  5 5  --   ALKPHOS  --   --  64 76  --   BILITOT  --   --  0.6 1.0  --   PROT  --   --  5.0* 5.1*  --   ALBUMIN 2.3* 2.4* 2.1* 2.1* 2.2*    CBG: No results for input(s): GLUCAP in the last 168 hours.   Recent Results (from the past 240 hour(s))  Urine Culture     Status: Abnormal   Collection Time: 06/09/20  6:00 PM   Specimen: Urine, Catheterized  Result Value Ref Range Status   Specimen Description URINE, CATHETERIZED  Final   Special Requests   Final    NONE Performed at [facility], [address], [place], [place] [address]    Culture MULTIPLE SPECIES PRESENT, SUGGEST RECOLLECTION ([place])  Final   Report Status 06/10/2020 FINAL  Final  C Difficile Quick Screen (NO PCR Reflex)     Status: Abnormal   Collection Time: 06/10/20  1:38 PM   Specimen: STOOL  Result Value Ref Range Status   C Diff antigen POSITIVE ([place]) NEGATIVE Final   C Diff toxin NEGATIVE NEGATIVE Final   C Diff interpretation   Final    Results are indeterminate. Please contact the provider listed for your campus for C diff questions in [place].    Comment: Performed at [facility], [place] [address], [place], [place] [address]         Radiology Studies: No results found.      Scheduled Meds: . amiodarone  200 mg Oral Daily  . calcium  acetate  667 mg Oral TID WC  . cephALEXin  250 mg Oral Daily  . docusate sodium  100 mg Oral BID  . feeding supplement (NEPRO CARB STEADY)  237 mL Oral BID BM  . latanoprost  1 drop Both Eyes QHS  . multivitamin  1 tablet Oral QHS  . saccharomyces boulardii  250 mg Oral Daily  . timolol  1 drop Both Eyes Daily  . Warfarin - Pharmacist Dosing Inpatient   Does not apply q1600   Continuous Infusions: . ferric gluconate (FERRLECIT/NULECIT) IV 125 mg (06/16/20 1553)  . methocarbamol (ROBAXIN) IV       LOS: 12 days    Time spent: over 30 min    Fayrene Helper, MD [facility]   To contact the attending provider between 7A-7P or the covering provider during after hours 7P-7A, please log into the web site [email or web address] and access using universal [place] password for that web site. If you do not have the password, please call the hospital operator.  06/19/2020, 3:29 PM

## 2020-06-19 NOTE — Progress Notes (Signed)
[place] for warfarin  Indication:  Atrial fibrillation  Allergies  Allergen Reactions  . Percocet [Oxycodone-Acetaminophen] Nausea And Vomiting  . Vicodin [Hydrocodone-Acetaminophen] Nausea And Vomiting  . Chlorhexidine Rash    Pt states she gets rash similar to "sunburn"    Patient Measurements: Height: 5\' 2"  (157.5 cm) Weight: 83.8 kg (184 lb 11.9 oz) IBW/kg (Calculated) : 50.1  Vital Signs: Temp: 97.9 F (36.6 C) (12/31 0728) Temp Source: Oral (12/31 0728) BP: 114/56 (12/31 1030) Pulse Rate: 71 (12/31 1030)  Labs: Recent Labs    06/17/20 0434 06/18/20 0228 06/18/20 0228 06/19/20 0609 06/19/20 0823 06/19/20 0824  HGB  --  10.0*   < > 10.0*  --  9.8*  HCT  --  32.3*  --  32.3*  --  30.0*  PLT  --  270  --  318  --  303  LABPROT 27.9*  --   --  36.9*  --   --   INR 2.7*  --   --  3.9*  --   --   CREATININE  --  5.75*  --  7.87* 7.84*  --    < > = values in this interval not displayed.    Estimated Creatinine Clearance: 6.2 mL/min (A) (by C-G formula based on SCr of 7.84 mg/dL (H)).   Medical History: History reviewed. No pertinent past medical history.  Assessment: 74 yr old female admitted on 06/07/20 with L femoral neck fracture; pt is S/P hemiarthroplasty.  Patient was taking warfarin at home prior to admission for atrial fibrillation (5 mg on Tues/Thurs, 2.5 mg on other days).  Warfarin was held for surgery, and pt was given vitamin K 5 mg IV X 1 on 12/19. Patient transitioned to heparin infusion in preparation for surgery. Heparin infusion now off and warfarin resumed 12/24.  Patient was receiving PTA regimen of warfarin while inpatient since 12/27. Today, INR 3.9 is supratherapeutic. No reported bleeding. Hgb 9.8.  On amiodarone PTA, continued during this admission.   Goal of Therapy:  INR 2-3   Monitor platelets by anticoagulation protocol: Yes   Plan:  - Hold warfarin tonight - Monitor INR, CBC,  signs/symptoms of bleeding daily    Cristela Felt, PharmD Clinical Pharmacist  06/19/2020 11:08 AM

## 2020-06-19 NOTE — Progress Notes (Signed)
[facility] Progress Note   Subjective:   Seen on HD (treatment rolled over from yesterday), tolerating well. No CP, SOB, dizziness. GI upset improved, now having some constipation.    Objective Vitals:   06/18/20 1438 06/18/20 2057 06/19/20 0728 06/19/20 0800  BP: (!) 157/58 (!) 149/61 129/74   Pulse: 70 70 68   Resp: 18  18   Temp: 97.7 F (36.5 C)  97.9 F (36.6 C)   TempSrc: Oral  Oral   SpO2: 99% 95%    Weight:    83.8 kg  Height:       Physical Exam General: Well developed, alert female in NAD Heart: RRR, 3/6 systolic murmur Lungs: CTA anteriorly without wheezing, rhonchi or rales Abdomen: Soft, non-tender, non-distended, +BS Extremities: No edema b/l lower extremities Dialysis Access:  LUE AVF currently accessed  Additional Objective Labs: Basic Metabolic Panel: Recent Labs  Lab 06/16/20 1102 06/18/20 0228 06/19/20 0609  NA 131* 133* 133*  K 3.9 4.1 4.5  CL 94* 95* 94*  CO2 25 25 26   GLUCOSE 94 85 79  BUN 37* 34* 48*  CREATININE 6.19* 5.75* 7.87*  CALCIUM 8.8* 8.9 9.1  PHOS 3.2 2.9 3.5   Liver Function Tests: Recent Labs  Lab 06/16/20 1102 06/18/20 0228 06/19/20 0609  AST  --  13* 13*  ALT  --  5 5  ALKPHOS  --  64 76  BILITOT  --  0.6 1.0  PROT  --  5.0* 5.1*  ALBUMIN 2.4* 2.1* 2.1*   CBC: Recent Labs  Lab 06/14/20 0806 06/15/20 0308 06/16/20 1102 06/18/20 0228 06/19/20 0609  WBC 9.1 9.3 10.8* 12.2* 11.3*  NEUTROABS  --   --   --  9.8*  --   HGB 9.3* 9.7* 10.7* 10.0* 10.0*  HCT 31.1* 30.2* 35.3* 32.3* 32.3*  MCV 97.2 95.0 97.2 95.8 94.2  PLT 269 243 303 270 318   Blood Culture    Component Value Date/Time   SDES URINE, CATHETERIZED 06/09/2020 1800   SPECREQUEST  06/09/2020 1800    NONE Performed at [facility], [address], [place], [place] [address]    CULT MULTIPLE SPECIES PRESENT, SUGGEST RECOLLECTION (A) 06/09/2020 1800   REPTSTATUS 06/10/2020 FINAL 06/09/2020 1800   Medications: . ferric  gluconate (FERRLECIT/NULECIT) IV 125 mg (06/16/20 1553)  . methocarbamol (ROBAXIN) IV     . amiodarone  200 mg Oral Daily  . calcium acetate  667 mg Oral TID WC  . cephALEXin  250 mg Oral Daily  . docusate sodium  100 mg Oral BID  . feeding supplement (NEPRO CARB STEADY)  237 mL Oral BID BM  . latanoprost  1 drop Both Eyes QHS  . multivitamin  1 tablet Oral QHS  . saccharomyces boulardii  250 mg Oral Daily  . timolol  1 drop Both Eyes Daily  . warfarin  2.5 mg Oral Once per day on Sun Mon Wed Fri Sat   And  . warfarin  5 mg Oral Once per day on Tue Thu  . Warfarin - Pharmacist Dosing Inpatient   Does not apply q1600    Dialysis Orders: TTS @ [facility] 4 hrs 180NRe 400/800 77.5 kg 2.0 K/ 2.0 Ca UFP 4 AVF Heparin 1000 units IV TIW - Mircera 150 mcg IV q 2 weeks (last dose 05/28/2020 last HGB 10.6 06/04/2020) - Venofer 100 mg IV X 10 doses (1/10 doses given) Tsat 19 05/28/2020.  Assessment/Plan:  1. L Hip fractures/p fall:  S/p L hiphemiarthroplasty12/21/2021. Pt hoping for CIR. Per primary/Orthopedics.  2. Acute on chronic diastolic HF- Last AC45 to 55% 06/08/2020.Hadevidence of mild volume overload by exam/CXR 12/20.Volume excess improved post HD.  3. ESRD: Continue HD per TTS schedule-HD today due to high pt census.Issues with cramping. Lowered machine temp-change to UFP 2 which has helped.Please continue these orders on DC. 4. Hypertension - BPreasonably controlledwithout meds. Volume management with HD, No edema. 5. Anemia: Hgb10-Aranesp 25 mcggiven 12/26 - follow for further need.She is finishing course of IV iron. 6. Metabolic bone disease: CorrCa 10.6, using low Ca bath today, Phos was low - Phoslo dose reduced to 1/meals and phos now improved to 3.5. 7. Nutrition - Albuminlow, restartedNepro. 8. PAF-on coumadin. Management per primary/pharmacy.  9. Prolonged QTc in setting of RBBB-no benadryl or agents that could increase QTc   Anice Paganini,  PA-C 06/19/2020, 8:17 AM  [facility] Pager: [phone]

## 2020-06-20 DIAGNOSIS — S72002D Fracture of unspecified part of neck of left femur, subsequent encounter for closed fracture with routine healing: Secondary | ICD-10-CM | POA: Diagnosis not present

## 2020-06-20 LAB — COMPREHENSIVE METABOLIC PANEL
ALT: 7 U/L (ref 0–44)
AST: 13 U/L — ABNORMAL LOW (ref 15–41)
Albumin: 2.3 g/dL — ABNORMAL LOW (ref 3.5–5.0)
Alkaline Phosphatase: 73 U/L (ref 38–126)
Anion gap: 12 (ref 5–15)
BUN: 28 mg/dL — ABNORMAL HIGH (ref 8–23)
CO2: 25 mmol/L (ref 22–32)
Calcium: 8.3 mg/dL — ABNORMAL LOW (ref 8.9–10.3)
Chloride: 96 mmol/L — ABNORMAL LOW (ref 98–111)
Creatinine, Ser: 5.39 mg/dL — ABNORMAL HIGH (ref 0.44–1.00)
GFR, Estimated: 8 mL/min — ABNORMAL LOW (ref >60)
Glucose, Bld: 106 mg/dL — ABNORMAL HIGH (ref 70–99)
Potassium: 3.9 mmol/L (ref 3.5–5.1)
Sodium: 133 mmol/L — ABNORMAL LOW (ref 135–145)
Total Bilirubin: 0.8 mg/dL (ref 0.3–1.2)
Total Protein: 5.3 g/dL — ABNORMAL LOW (ref 6.5–8.1)

## 2020-06-20 LAB — MAGNESIUM: Magnesium: 1.9 mg/dL (ref 1.7–2.4)

## 2020-06-20 LAB — CBC
HCT: 29.5 % — ABNORMAL LOW (ref 36.0–46.0)
Hemoglobin: 9.7 g/dL — ABNORMAL LOW (ref 12.0–15.0)
MCH: 30.7 pg (ref 26.0–34.0)
MCHC: 32.9 g/dL (ref 30.0–36.0)
MCV: 93.4 fL (ref 80.0–100.0)
Platelets: 287 10*3/uL (ref 150–400)
RBC: 3.16 MIL/uL — ABNORMAL LOW (ref 3.87–5.11)
RDW: 16.8 % — ABNORMAL HIGH (ref 11.5–15.5)
WBC: 15.3 10*3/uL — ABNORMAL HIGH (ref 4.0–10.5)
nRBC: 0 % (ref 0.0–0.2)

## 2020-06-20 LAB — PROTIME-INR
INR: 3.4 — ABNORMAL HIGH (ref 0.8–1.2)
Prothrombin Time: 33.3 seconds — ABNORMAL HIGH (ref 11.4–15.2)

## 2020-06-20 LAB — PHOSPHORUS: Phosphorus: 3.4 mg/dL (ref 2.5–4.6)

## 2020-06-20 NOTE — Progress Notes (Signed)
[place] for warfarin  Indication:  Atrial fibrillation  Allergies  Allergen Reactions  . Percocet [Oxycodone-Acetaminophen] Nausea And Vomiting  . Vicodin [Hydrocodone-Acetaminophen] Nausea And Vomiting  . Chlorhexidine Rash    Pt states she gets rash similar to "sunburn"    Patient Measurements: Height: 5\' 2"  (157.5 cm) Weight: 83.8 kg (184 lb 11.9 oz) IBW/kg (Calculated) : 50.1  Vital Signs: Temp: 97.5 F (36.4 C) (01/01 0513) Temp Source: Oral (01/01 0513) BP: 143/50 (01/01 0513) Pulse Rate: 74 (01/01 0513)  Labs: Recent Labs    06/19/20 0609 06/19/20 0823 06/19/20 0824 06/20/20 0306  HGB 10.0*  --  9.8* 9.7*  HCT 32.3*  --  30.0* 29.5*  PLT 318  --  303 287  LABPROT 36.9*  --   --  33.3*  INR 3.9*  --   --  3.4*  CREATININE 7.87* 7.84*  --  5.39*    Estimated Creatinine Clearance: 9.1 mL/min (A) (by C-G formula based on SCr of 5.39 mg/dL (H)).   Medical History: History reviewed. No pertinent past medical history.  Assessment: 75 yr old female admitted on 06/07/20 with L femoral neck fracture; pt is S/P hemiarthroplasty.  Patient was taking warfarin at home prior to admission for atrial fibrillation (5 mg on Tues/Thurs, 2.5 mg on other days).  Warfarin was held for surgery, and pt was given vitamin K 5 mg IV X 1 on 12/19. Patient transitioned to heparin infusion in preparation for surgery. Heparin infusion now off and warfarin resumed 12/24.  Patient was receiving PTA regimen of warfarin while inpatient since 12/27. Today, INR 3.4 is supratherapeutic after holding one dose. No reported bleeding. Hgb 9.7.  On amiodarone PTA, continued during this admission.   Goal of Therapy:  INR 2-3   Monitor platelets by anticoagulation protocol: Yes   Plan:  - Hold warfarin tonight - Monitor INR, CBC, signs/symptoms of bleeding daily   Berenice Bouton, PharmD, BCPS PGY2 Pharmacy Resident Phone between 7 am - 3:30 pm:  [phone]  Please check AMION for all [place] phone numbers After 10:00 PM, call [place] [phone]  06/20/2020 7:11 AM

## 2020-06-20 NOTE — Progress Notes (Signed)
Upon taking her medications, pt vomited 40mL of clear emesis.Pt stated she just continues to have pain in the left hip. PRN oxycodone given. PRN zofran given. RN will continue to monitor pt's status.

## 2020-06-20 NOTE — Progress Notes (Signed)
PROGRESS NOTE    [name]  JSE:[id number] DOB: 07-10-1945 DOA: 06/07/2020 PCP: No primary care provider on file.   No chief complaint on file.  Brief Narrative:  75 year old female with history of dilated cardiomyopathy, EF of 20 to 25%, in setting of sepsis in 0737 complicated by demand ischemia, cardiac catheterization in 2014 showed normal coronaries, chronic diastolic CHF, PAF on Coumadin anticoagulation, moderate to severe aortic stenosis, tolerated TKR in/21 per cardiology may need consideration of AVR at some point, hyperlipidemia, essential hypertension, ESRD on TTS hemodialysis, GERD, migraine, glaucoma presented to the ED following mechanical fall at home with head injury and left hip pain.  In the ED she was found to be hypoxemic with O2 sats 82% on room air and placed on oxygen.  CT head was negative for acute abnormality.  X-ray of the hip showed acute left femoral neck fracture with superior subluxation.  Orthopedics was consulted and underwent repair on 06/09/2020.   Assessment & Plan:   Principal Problem:   Hip fracture ([place]) Active Problems:   Leukocytosis   Acute hypoxemic respiratory failure (HCC)   Atrial fibrillation (HCC)   ESRD (end stage renal disease) ([name])  1. Nausea  Vomiting: with meds x1 this AM, continue to monitor  2. Left femoral neck fracture: s/p left hemiarthroplasty on 06/09/2020.  Initially requested CIR however insurance will not cover.  Awaiting skilled nursing facility placement.  Pt and pt daughter requesting [name] to look into CIR again, discussed with SW. 1. Awaiting insurance auth for inpatient rehab   3. Acute hypoxemic respiratory failure-secondary to acute on chronic diastolic CHF  1. patient on ESRD, required 2 L of oxygen, s/p HD -> now on RA 2. Most recent echo showed EF of 55 to 10%, grade 2 diastolic dysfunction.  4. C. difficile colonization, loose stool-patient has history of C. difficile, C. difficile PCR was checked however was  found to have positive antigen but PCR toxin was negative.  Abdomen is soft, nontender, WBC is normal. She was having intermittent loose stools. Now resolved per patient, asking for stool softener. She has been on chronic Keflex for past 4 years.  At this time C. difficile appears to be [name] colonizer without active infection.  Continue probiotics. 1. Continue to monitor, w/u further as indicated (diarrhea/abdominal pain)  5. ESRD on TTS hemodialysis. Nephrology has been consulted.  Dialysis per nephrology.  6. Paroxysmal atrial fibrillation-patient has history of possible atrial fibrillation, she received vitamin K in preparation of surgery for supratherapeutic INR.   1. Continue warfarin per pharmacy - supratherapeutic today, hold warfarin again tonight per pharmacy  7. Hypertension-blood pressure is stable  8. Focal osseous irregularity-seen on the chest x-ray.  There is [name] focal osseous irregularity near the left coracoid process, could be from patient positioning. X-ray of the left shoulder unremarkable per report.  9. Recurrent UTIs -continue Keflex, patient has been on Keflex for past 4 years.  10. Leukocytosis: worsening, no clear si/sx infection - afebrile, will continue to monitor   DVT prophylaxis: warfarin  Code Status: full  Family Communication: daughter at bedside 12/30 Disposition:   Status is: Inpatient  Remains inpatient appropriate because:Inpatient level of care appropriate due to severity of illness   Dispo: The patient is from: Home              Anticipated d/c is to: pending              Anticipated d/c date is: > 3 days  Patient currently is not medically stable to d/c.   Consultants:   orthopedics  Procedures:  12/21 hemiarthroplasty   Antimicrobials:  Anti-infectives (From admission, onward)   Start     Dose/Rate Route Frequency Ordered Stop   06/11/20 1000  cephALEXin (KEFLEX) capsule 250 mg        250 mg Oral Daily 06/09/20 2154      06/09/20 2245  ceFAZolin (ANCEF) IVPB 2g/100 mL premix        2 g 200 mL/hr over 30 Minutes Intravenous Every 6 hours 06/09/20 2152 06/10/20 0550   06/09/20 1630  ceFAZolin (ANCEF) IVPB 2g/100 mL premix        2 g 200 mL/hr over 30 Minutes Intravenous On call to O.R. 06/09/20 1616 06/09/20 1744   06/09/20 1619  ceFAZolin (ANCEF) 2-4 GM/100ML-% IVPB       Note to Pharmacy: [name]   : cabinet override      06/09/20 1619 06/09/20 1746   06/08/20 1245  cephALEXin (KEFLEX) capsule 250 mg  Status:  Discontinued        250 mg Oral Daily 06/08/20 1241 06/09/20 2154     Subjective: Nausea, vomiting with meds today  Objective: Vitals:   06/19/20 2145 06/20/20 0513 06/20/20 0818 06/20/20 1430  BP: (!) 140/58 (!) 143/50 (!) 138/53 (!) 133/54  Pulse: 72 74 72 74  Resp: 17 15 18 18   Temp: 98 F (36.7 C) (!) 97.5 F (36.4 C) 98.2 F (36.8 C) 98.2 F (36.8 C)  TempSrc: Oral Oral Oral Oral  SpO2: 93% 93% 97% 93%  Weight:      Height:        Intake/Output Summary (Last 24 hours) at 06/20/2020 1619 Last data filed at 06/20/2020 1300 Gross per 24 hour  Intake 120 ml  Output --  Net 120 ml   Filed Weights   06/16/20 1648 06/17/20 0500 06/19/20 0800  Weight: 78.4 kg 79.1 kg 83.8 kg    Examination:  General: No acute distress. Cardiovascular: Heart sounds show [name] regular rate, and rhythm Lungs: Clear to auscultation bilaterally Abdomen: Soft, nontender, nondistended  Neurological: Alert and oriented 3. Moves all extremities 4. Cranial nerves II through XII grossly intact. Skin: Warm and dry. No rashes or lesions. Extremities: LLE with intact dressing  Data Reviewed: I have personally reviewed following labs and imaging studies  CBC: Recent Labs  Lab 06/16/20 1102 06/18/20 0228 06/19/20 0609 06/19/20 0824 06/20/20 0306  WBC 10.8* 12.2* 11.3* 11.2* 15.3*  NEUTROABS  --  9.8*  --   --   --   HGB 10.7* 10.0* 10.0* 9.8* 9.7*  HCT 35.3* 32.3* 32.3* 30.0* 29.5*  MCV 97.2  95.8 94.2 93.2 93.4  PLT 303 270 318 303 790    Basic Metabolic Panel: Recent Labs  Lab 06/16/20 1102 06/18/20 0228 06/19/20 0609 06/19/20 0823 06/20/20 0306  NA 131* 133* 133* 132* 133*  K 3.9 4.1 4.5 4.5 3.9  CL 94* 95* 94* 94* 96*  CO2 25 25 26 23 25   GLUCOSE 94 85 79 109* 106*  BUN 37* 34* 48* 50* 28*  CREATININE 6.19* 5.75* 7.87* 7.84* 5.39*  CALCIUM 8.8* 8.9 9.1 8.9 8.3*  MG  --  2.0 2.1  --  1.9  PHOS 3.2 2.9 3.5 3.5 3.4    GFR: Estimated Creatinine Clearance: 9.1 mL/min (Almin Livingstone) (by C-G formula based on SCr of 5.39 mg/dL (H)).  Liver Function Tests: Recent Labs  Lab 06/16/20 1102 06/18/20 0228 06/19/20 2409  06/19/20 0823 06/20/20 0306  AST  --  13* 13*  --  13*  ALT  --  5 5  --  7  ALKPHOS  --  64 76  --  73  BILITOT  --  0.6 1.0  --  0.8  PROT  --  5.0* 5.1*  --  5.3*  ALBUMIN 2.4* 2.1* 2.1* 2.2* 2.3*    CBG: No results for input(s): GLUCAP in the last 168 hours.   No results found for this or any previous visit (from the past 240 hour(s)).       Radiology Studies: No results found.      Scheduled Meds: . amiodarone  200 mg Oral Daily  . calcium acetate  667 mg Oral TID WC  . cephALEXin  250 mg Oral Daily  . docusate sodium  100 mg Oral BID  . feeding supplement (NEPRO CARB STEADY)  237 mL Oral BID BM  . latanoprost  1 drop Both Eyes QHS  . multivitamin  1 tablet Oral QHS  . saccharomyces boulardii  250 mg Oral Daily  . timolol  1 drop Both Eyes Daily  . Warfarin - Pharmacist Dosing Inpatient   Does not apply q1600   Continuous Infusions: . ferric gluconate (FERRLECIT/NULECIT) IV 125 mg (06/16/20 1553)  . methocarbamol (ROBAXIN) IV       LOS: 13 days    Time spent: over 30 min    Fayrene Helper, MD [facility]   To contact the attending provider between 7A-7P or the covering provider during after hours 7P-7A, please log into the web site [email or web address] and access using universal [place] password for that web site.  If you do not have the password, please call the hospital operator.  06/20/2020, 4:19 PM

## 2020-06-20 NOTE — Progress Notes (Signed)
[name] Progress Note   Subjective:   Seen in room, having more hip pain this AM. Denies SOB, CP, palpitations, dizziness. Tolerated HD yesterday with net UF 2L.  Objective Vitals:   06/19/20 1523 06/19/20 2145 06/20/20 0513 06/20/20 0818  BP: (!) 149/56 (!) 140/58 (!) 143/50 (!) 138/53  Pulse: 75 72 74 72  Resp: 17 17 15 18   Temp: 97.7 F (36.5 C) 98 F (36.7 C) (!) 97.5 F (36.4 C) 98.2 F (36.8 C)  TempSrc: Oral Oral Oral Oral  SpO2:  93% 93% 97%  Weight:      Height:       Physical Exam  General: Well developed, alert female in NAD Heart: RRR, 3/6 systolic murmur Lungs: CTA anteriorly without wheezing, rhonchi or rales Abdomen: Soft, non-tender, non-distended, +BS Extremities: No edema b/l lower extremities Dialysis Access:  LUE AVF + bruit   Additional Objective Labs: Basic Metabolic Panel: Recent Labs  Lab 06/19/20 0609 06/19/20 0823 06/20/20 0306  NA 133* 132* 133*  K 4.5 4.5 3.9  CL 94* 94* 96*  CO2 26 23 25   GLUCOSE 79 109* 106*  BUN 48* 50* 28*  CREATININE 7.87* 7.84* 5.39*  CALCIUM 9.1 8.9 8.3*  PHOS 3.5 3.5 3.4   Liver Function Tests: Recent Labs  Lab 06/18/20 0228 06/19/20 0609 06/19/20 0823 06/20/20 0306  AST 13* 13*  --  13*  ALT 5 5  --  7  ALKPHOS 64 76  --  73  BILITOT 0.6 1.0  --  0.8  PROT 5.0* 5.1*  --  5.3*  ALBUMIN 2.1* 2.1* 2.2* 2.3*   CBC: Recent Labs  Lab 06/16/20 1102 06/18/20 0228 06/19/20 0609 06/19/20 0824 06/20/20 0306  WBC 10.8* 12.2* 11.3* 11.2* 15.3*  NEUTROABS  --  9.8*  --   --   --   HGB 10.7* 10.0* 10.0* 9.8* 9.7*  HCT 35.3* 32.3* 32.3* 30.0* 29.5*  MCV 97.2 95.8 94.2 93.2 93.4  PLT 303 270 318 303 287   Blood Culture    Component Value Date/Time   SDES URINE, CATHETERIZED 06/09/2020 1800   SPECREQUEST  06/09/2020 1800    NONE Performed at [facility], [address] [place], [place] [address]    CULT MULTIPLE SPECIES PRESENT, SUGGEST RECOLLECTION (A) 06/09/2020  1800   REPTSTATUS 06/10/2020 FINAL 06/09/2020 1800   Medications: . ferric gluconate (FERRLECIT/NULECIT) IV 125 mg (06/16/20 1553)  . methocarbamol (ROBAXIN) IV     . amiodarone  200 mg Oral Daily  . calcium acetate  667 mg Oral TID WC  . cephALEXin  250 mg Oral Daily  . docusate sodium  100 mg Oral BID  . feeding supplement (NEPRO CARB STEADY)  237 mL Oral BID BM  . latanoprost  1 drop Both Eyes QHS  . multivitamin  1 tablet Oral QHS  . saccharomyces boulardii  250 mg Oral Daily  . timolol  1 drop Both Eyes Daily  . Warfarin - Pharmacist Dosing Inpatient   Does not apply q1600    Dialysis Orders: TTS @ [facility] 4 hrs 180NRe 400/800 77.5 kg 2.0 K/ 2.0 Ca UFP 4 AVF Heparin 1000 units IV TIW - Mircera 150 mcg IV q 2 weeks (last dose 05/28/2020 last HGB 10.6 06/04/2020) - Venofer 100 mg IV X 10 doses (1/10 doses given) Tsat 19 05/28/2020.  Assessment/Plan:  1. L Hip fractures/p fall: S/p L hiphemiarthroplasty12/21/2021. Pt hoping for CIR. Per primary/Orthopedics.  2. Acute on chronic diastolic HF-  Last EF50 to 55% 06/08/2020.Hadevidence of mild volume overload by exam/CXR 12/20.Volume excess improved post HD.  3. ESRD: Usually TTS schedule, last HD yesterday due to census/staffing. HD today to get back on schedule. Issues with cramping. Lowered machine temp-change to UFP 2 which has helped.Please continue these orders on DC. 4. Hypertension - BPreasonably controlledwithout meds. Volume management with HD, No edema. 5. Anemia: Hgb9.7-Aranesp 25 mcggiven 12/26 -resume on Tuesday if Hgb still <10.She is finishing course of IV iron. 6. Metabolic bone disease: CorrCa was high but improved with low Ca bath, Phos was low - Phoslo dose reduced to 1/meals and phos now improved to 3.5. 7. Nutrition - Albuminlow, restartedNepro. 8. PAF-on coumadin. Management per primary/pharmacy.  9. Prolonged QTc in setting of RBBB-no benadryl or agents that could increase QTc  Anice Paganini, PA-C 06/20/2020, 9:22 AM  [facility] Pager: [phone]

## 2020-06-20 NOTE — Progress Notes (Signed)
Subjective: 11 Days Post-Op s/p Procedure(s): ARTHROPLASTY BIPOLAR HIP (HEMIARTHROPLASTY)   Patient is alert, oriented, laying in bed. States pain is moderate this morning, worse with movement.   Objective:  PE: VITALS:   Vitals:   06/19/20 1253 06/19/20 1523 06/19/20 2145 06/20/20 0513  BP: (!) 117/52 (!) 149/56 (!) 140/58 (!) 143/50  Pulse: 73 75 72 74  Resp: 18 17 17 15   Temp: 97.8 F (36.6 C) 97.7 F (36.5 C) 98 F (36.7 C) (!) 97.5 F (36.4 C)  TempSrc: Oral Oral Oral Oral  SpO2: 92%  93% 93%  Weight:      Height:       General: alert, oriented, laying in bed, in no acute distress GI: abdomen soft, nontender MSK: LLE - dorsiflexion and plantarflexion intact. Able to move all toes. Distal sensation intact. Foot warm and well perfused. Mild TTP to left hip. Dressing CDI.No ecchymosis.She does have some erythema to the skin where her ice pack has been laying.    LABS  Results for orders placed or performed during the hospital encounter of 06/07/20 (from the past 24 hour(s))  Renal function panel     Status: Abnormal   Collection Time: 06/19/20  8:23 AM  Result Value Ref Range   Sodium 132 (L) 135 - 145 mmol/L   Potassium 4.5 3.5 - 5.1 mmol/L   Chloride 94 (L) 98 - 111 mmol/L   CO2 23 22 - 32 mmol/L   Glucose, Bld 109 (H) 70 - 99 mg/dL   BUN 50 (H) 8 - 23 mg/dL   Creatinine, Ser 7.84 (H) 0.44 - 1.00 mg/dL   Calcium 8.9 8.9 - 10.3 mg/dL   Phosphorus 3.5 2.5 - 4.6 mg/dL   Albumin 2.2 (L) 3.5 - 5.0 g/dL   GFR, Estimated 5 (L) >60 mL/min   Anion gap 15 5 - 15  CBC     Status: Abnormal   Collection Time: 06/19/20  8:24 AM  Result Value Ref Range   WBC 11.2 (H) 4.0 - 10.5 K/uL   RBC 3.22 (L) 3.87 - 5.11 MIL/uL   Hemoglobin 9.8 (L) 12.0 - 15.0 g/dL   HCT 30.0 (L) 36.0 - 46.0 %   MCV 93.2 80.0 - 100.0 fL   MCH 30.4 26.0 - 34.0 pg   MCHC 32.7 30.0 - 36.0 g/dL   RDW 16.9 (H) 11.5 - 15.5 %   Platelets 303 150 - 400 K/uL   nRBC 0.0 0.0 - 0.2 %  CBC      Status: Abnormal   Collection Time: 06/20/20  3:06 AM  Result Value Ref Range   WBC 15.3 (H) 4.0 - 10.5 K/uL   RBC 3.16 (L) 3.87 - 5.11 MIL/uL   Hemoglobin 9.7 (L) 12.0 - 15.0 g/dL   HCT 29.5 (L) 36.0 - 46.0 %   MCV 93.4 80.0 - 100.0 fL   MCH 30.7 26.0 - 34.0 pg   MCHC 32.9 30.0 - 36.0 g/dL   RDW 16.8 (H) 11.5 - 15.5 %   Platelets 287 150 - 400 K/uL   nRBC 0.0 0.0 - 0.2 %  Protime-INR     Status: Abnormal   Collection Time: 06/20/20  3:06 AM  Result Value Ref Range   Prothrombin Time 33.3 (H) 11.4 - 15.2 seconds   INR 3.4 (H) 0.8 - 1.2  Comprehensive metabolic panel     Status: Abnormal   Collection Time: 06/20/20  3:06 AM  Result Value Ref Range   Sodium  133 (L) 135 - 145 mmol/L   Potassium 3.9 3.5 - 5.1 mmol/L   Chloride 96 (L) 98 - 111 mmol/L   CO2 25 22 - 32 mmol/L   Glucose, Bld 106 (H) 70 - 99 mg/dL   BUN 28 (H) 8 - 23 mg/dL   Creatinine, Ser 5.39 (H) 0.44 - 1.00 mg/dL   Calcium 8.3 (L) 8.9 - 10.3 mg/dL   Total Protein 5.3 (L) 6.5 - 8.1 g/dL   Albumin 2.3 (L) 3.5 - 5.0 g/dL   AST 13 (L) 15 - 41 U/L   ALT 7 0 - 44 U/L   Alkaline Phosphatase 73 38 - 126 U/L   Total Bilirubin 0.8 0.3 - 1.2 mg/dL   GFR, Estimated 8 (L) >60 mL/min   Anion gap 12 5 - 15  Magnesium     Status: None   Collection Time: 06/20/20  3:06 AM  Result Value Ref Range   Magnesium 1.9 1.7 - 2.4 mg/dL  Phosphorus     Status: None   Collection Time: 06/20/20  3:06 AM  Result Value Ref Range   Phosphorus 3.4 2.5 - 4.6 mg/dL    No results found.  Assessment/Plan:  Left femoral knee fracture: 11 Days Post-Op s/p Procedure(s): ARTHROPLASTY BIPOLAR HIP (HEMIARTHROPLASTY)  Weightbearing:WBATLLE, up with therapy Insicional and dressing care:Changed dressing this AM, Reinforce dressings as needed. Please make sure she has some sort of barrier (towel or gown) between ice and her skin/bandage when she is icing her hip.  VTE prophylaxis:home coumadin Pain control:continue current regimen Follow  - up plan:2 weekswith Dr. Mardelle Matte Dispo: ok to discharge from orthopedic standpoint. Family asking to pursue CIR again, awaiting insurance authorization.  Will continue to follow along intermittently.   Contact information:   Weekdays 8-5 [address], [place] [phone] A fter hours and holidays please check [email or web address] for group call information for [facility] 06/20/2020, 8:05 AM

## 2020-06-20 NOTE — Plan of Care (Signed)
  Problem: Activity: Goal: Risk for activity intolerance will decrease Outcome: Progressing   Problem: Coping: Goal: Level of anxiety will decrease Outcome: Progressing   Problem: Pain Managment: Goal: General experience of comfort will improve Outcome: Progressing   Problem: Safety: Goal: Ability to remain free from injury will improve Outcome: Progressing   Problem: Skin Integrity: Goal: Risk for impaired skin integrity will decrease Outcome: Progressing   

## 2020-06-20 NOTE — Plan of Care (Signed)
°  Problem: Education: Goal: Knowledge of General Education information will improve Description Including pain rating scale, medication(s)/side effects and non-pharmacologic comfort measures Outcome: Progressing   Problem: Activity: Goal: Risk for activity intolerance will decrease Outcome: Progressing   Problem: Pain Managment: Goal: General experience of comfort will improve Outcome: Progressing   Problem: Safety: Goal: Ability to remain free from injury will improve Outcome: Progressing   Problem: Skin Integrity: Goal: Risk for impaired skin integrity will decrease Outcome: Progressing   

## 2020-06-21 DIAGNOSIS — S72002D Fracture of unspecified part of neck of left femur, subsequent encounter for closed fracture with routine healing: Secondary | ICD-10-CM | POA: Diagnosis not present

## 2020-06-21 LAB — CBC
HCT: 30.6 % — ABNORMAL LOW (ref 36.0–46.0)
Hemoglobin: 9.9 g/dL — ABNORMAL LOW (ref 12.0–15.0)
MCH: 31.1 pg (ref 26.0–34.0)
MCHC: 32.4 g/dL (ref 30.0–36.0)
MCV: 96.2 fL (ref 80.0–100.0)
Platelets: 293 10*3/uL (ref 150–400)
RBC: 3.18 MIL/uL — ABNORMAL LOW (ref 3.87–5.11)
RDW: 16.9 % — ABNORMAL HIGH (ref 11.5–15.5)
WBC: 12.4 10*3/uL — ABNORMAL HIGH (ref 4.0–10.5)
nRBC: 0 % (ref 0.0–0.2)

## 2020-06-21 LAB — COMPREHENSIVE METABOLIC PANEL
ALT: 8 U/L (ref 0–44)
AST: 16 U/L (ref 15–41)
Albumin: 2.2 g/dL — ABNORMAL LOW (ref 3.5–5.0)
Alkaline Phosphatase: 77 U/L (ref 38–126)
Anion gap: 15 (ref 5–15)
BUN: 13 mg/dL (ref 8–23)
CO2: 26 mmol/L (ref 22–32)
Calcium: 8.4 mg/dL — ABNORMAL LOW (ref 8.9–10.3)
Chloride: 95 mmol/L — ABNORMAL LOW (ref 98–111)
Creatinine, Ser: 3.71 mg/dL — ABNORMAL HIGH (ref 0.44–1.00)
GFR, Estimated: 12 mL/min — ABNORMAL LOW (ref >60)
Glucose, Bld: 91 mg/dL (ref 70–99)
Potassium: 3.7 mmol/L (ref 3.5–5.1)
Sodium: 136 mmol/L (ref 135–145)
Total Bilirubin: 0.9 mg/dL (ref 0.3–1.2)
Total Protein: 5.4 g/dL — ABNORMAL LOW (ref 6.5–8.1)

## 2020-06-21 LAB — PROTIME-INR
INR: 2.9 — ABNORMAL HIGH (ref 0.8–1.2)
Prothrombin Time: 29.7 seconds — ABNORMAL HIGH (ref 11.4–15.2)

## 2020-06-21 LAB — MAGNESIUM: Magnesium: 1.9 mg/dL (ref 1.7–2.4)

## 2020-06-21 LAB — PHOSPHORUS: Phosphorus: 3.2 mg/dL (ref 2.5–4.6)

## 2020-06-21 MED ORDER — WARFARIN SODIUM 2.5 MG PO TABS
2.5000 mg | ORAL_TABLET | Freq: Once | ORAL | Status: AC
  Administered 2020-06-21: 2.5 mg via ORAL
  Filled 2020-06-21: qty 1

## 2020-06-21 NOTE — Plan of Care (Signed)

## 2020-06-21 NOTE — Progress Notes (Signed)
[facility] Progress Note   Subjective:   Patient seen in room. No new concerns today.Denies SOB, CP, palpitations, dizziness, GI upset.   Objective Vitals:   06/21/20 0030 06/21/20 0100 06/21/20 0124 06/21/20 0700  BP: (!) 108/49 (!) 106/45 (!) 113/38 (!) 123/56  Pulse: 73 75 73 75  Resp:  18 19 18   Temp:  97.6 F (36.4 C) 98.4 F (36.9 C) 98.3 F (36.8 C)  TempSrc:  Oral  Oral  SpO2:  90% 92% 93%  Weight:      Height:       Physical Exam General:Well developed, alert female in NAD Heart:RRR, 3/6 systolic murmur Lungs:CTA anteriorly without wheezing, rhonchi or rales Abdomen:Soft, non-tender, non-distended, +BS Extremities:No edema b/l lower extremities Dialysis Access:LUE AVF + bruit  Additional Objective Labs: Basic Metabolic Panel: Recent Labs  Lab 06/19/20 0823 06/20/20 0306 06/21/20 0618  NA 132* 133* 136  K 4.5 3.9 3.7  CL 94* 96* 95*  CO2 23 25 26   GLUCOSE 109* 106* 91  BUN 50* 28* 13  CREATININE 7.84* 5.39* 3.71*  CALCIUM 8.9 8.3* 8.4*  PHOS 3.5 3.4 3.2   Liver Function Tests: Recent Labs  Lab 06/19/20 0609 06/19/20 0823 06/20/20 0306 06/21/20 0618  AST 13*  --  13* 16  ALT 5  --  7 8  ALKPHOS 76  --  73 77  BILITOT 1.0  --  0.8 0.9  PROT 5.1*  --  5.3* 5.4*  ALBUMIN 2.1* 2.2* 2.3* 2.2*   CBC: Recent Labs  Lab 06/18/20 0228 06/19/20 0609 06/19/20 0824 06/20/20 0306 06/21/20 0618  WBC 12.2* 11.3* 11.2* 15.3* 12.4*  NEUTROABS 9.8*  --   --   --   --   HGB 10.0* 10.0* 9.8* 9.7* 9.9*  HCT 32.3* 32.3* 30.0* 29.5* 30.6*  MCV 95.8 94.2 93.2 93.4 96.2  PLT 270 318 303 287 293   Blood Culture    Component Value Date/Time   SDES URINE, CATHETERIZED 06/09/2020 1800   SPECREQUEST  06/09/2020 1800    NONE Performed at [facility] [address], [place], [place] [address]    CULT MULTIPLE SPECIES PRESENT, SUGGEST RECOLLECTION (A) 06/09/2020 1800   REPTSTATUS 06/10/2020 FINAL 06/09/2020 1800    Medications: . ferric gluconate (FERRLECIT/NULECIT) IV 125 mg (06/16/20 1553)  . methocarbamol (ROBAXIN) IV     . amiodarone  200 mg Oral Daily  . calcium acetate  667 mg Oral TID WC  . cephALEXin  250 mg Oral Daily  . docusate sodium  100 mg Oral BID  . feeding supplement (NEPRO CARB STEADY)  237 mL Oral BID BM  . latanoprost  1 drop Both Eyes QHS  . multivitamin  1 tablet Oral QHS  . saccharomyces boulardii  250 mg Oral Daily  . timolol  1 drop Both Eyes Daily  . warfarin  2.5 mg Oral ONCE-1600  . Warfarin - Pharmacist Dosing Inpatient   Does not apply q1600    Dialysis Orders:  TTS @ [facility] 4 hrs 180NRe 400/800 77.5 kg 2.0 K/ 2.0 Ca UFP 4 AVF Heparin 1000 units IV TIW - Mircera 150 mcg IV q 2 weeks (last dose 05/28/2020 last HGB 10.6 06/04/2020) - Venofer 100 mg IV X 10 doses (1/10 doses given) Tsat 19 05/28/2020.   Assessment/Plan:  1. L Hip fractures/p fall: S/p L hiphemiarthroplasty12/21/2021.Pt hoping for CIR.Per primary/Orthopedics.  2. Acute on chronic diastolic HF- Last JK93 to 55% 06/08/2020.Hadevidence of mild volume overload by exam/CXR  12/20.Volume excess resolved post HD.  3. ESRD: Usually TTS schedule, Issues with cramping. Lowered machine temp-change to UFP 2 which has helped.Please continue these orders on DC. Continue TTS schedule 4. Hypertension - BPreasonably controlledwithout meds.Volume management with HD,No edema. 5. Anemia: Hgb9.9-Aranesp 25 mcggiven 12/26 -resume on Tuesday if Hgb still <10.She is finishing course of IV iron. 6. Metabolic bone disease: CorrCawas high but improved with low Ca bath, Phos was low - Phoslo dose reduced to 1/mealsand phos now at goal 7. Nutrition - Albuminlow, restartedNepro. 8. PAF-on coumadin. Management per primary/pharmacy.  9. Prolonged QTc in setting of RBBB-no benadryl or agents that could increase QTc   Anice Paganini, PA-C 06/21/2020, 11:57 AM  [facility] Pager:  [phone]

## 2020-06-21 NOTE — Progress Notes (Signed)
PROGRESS NOTE    [name]  OVF:[id number] DOB: [birth year] DOA: 06/07/2020 PCP: No primary care provider on file.   No chief complaint on file.  Brief Narrative:  75 year old female with history of dilated cardiomyopathy, EF of 20 to 25%, in setting of sepsis in 8416 complicated by demand ischemia, cardiac catheterization in 2014 showed normal coronaries, chronic diastolic CHF, PAF on Coumadin anticoagulation, moderate to severe aortic stenosis, tolerated TKR in/21 per cardiology may need consideration of AVR at some point, hyperlipidemia, essential hypertension, ESRD on TTS hemodialysis, GERD, migraine, glaucoma presented to the ED following mechanical fall at home with head injury and left hip pain.  In the ED she was found to be hypoxemic with O2 sats 82% on room air and placed on oxygen.  CT head was negative for acute abnormality.  X-ray of the hip showed acute left femoral neck fracture with superior subluxation.  Orthopedics was consulted and underwent repair on 06/09/2020.   Assessment & Plan:   Principal Problem:   Hip fracture ([place]) Active Problems:   Leukocytosis   Acute hypoxemic respiratory failure (HCC)   Atrial fibrillation (HCC)   ESRD (end stage renal disease) ([place])  1. Nausea  Vomiting: nausea again this morning - ? meds - no abdominal TTP.  Will continue to monitor.  2. Left femoral neck fracture: s/p left hemiarthroplasty on 06/09/2020.  Initially requested CIR however insurance will not cover.  Awaiting skilled nursing facility placement.  Pt and pt daughter requesting [name] to look into CIR again, discussed with SW. 1. Awaiting insurance auth for inpatient rehab   3. Acute hypoxemic respiratory failure-secondary to acute on chronic diastolic CHF  1. patient on ESRD, required 2 L of oxygen, s/p HD -> now on RA 2. Most recent echo showed EF of 55 to 60%, grade 2 diastolic dysfunction.  4. C. difficile colonization, loose stool-patient has history of C. difficile,  C. difficile PCR was checked however was found to have positive antigen but PCR toxin was negative.  Abdomen is soft, nontender, WBC is normal. She was having intermittent loose stools. Now resolved per patient, asking for stool softener. She has been on chronic Keflex for past 4 years.  At this time C. difficile appears to be Bastion Bolger colonizer without active infection.  Continue probiotics. 1. Continue to monitor, w/u further as indicated (diarrhea/abdominal pain)  5. ESRD on TTS hemodialysis. Nephrology has been consulted.  Dialysis per nephrology.  6. Paroxysmal atrial fibrillation-patient has history of possible atrial fibrillation, she received vitamin K in preparation of surgery for supratherapeutic INR.   1. Continue warfarin per pharmacy - warfarin per pharmacy  7. Hypertension-blood pressure is stable  8. Focal osseous irregularity-seen on the chest x-ray.  There is Verdun Rackley focal osseous irregularity near the left coracoid process, could be from patient positioning. X-ray of the left shoulder unremarkable per report.  9. Recurrent UTIs -continue Keflex, patient has been on Keflex for past 4 years.  10. Leukocytosis: fluctuating, no clear si/sx infection - afebrile, will continue to monitor   DVT prophylaxis: warfarin  Code Status: full  Family Communication: daughter at bedside  Disposition:   Status is: Inpatient  Remains inpatient appropriate because:Inpatient level of care appropriate due to severity of illness   Dispo: The patient is from: Home              Anticipated d/c is to: pending              Anticipated d/c date is: > 3  days              Patient currently is not medically stable to d/c.   Consultants:   orthopedics  Procedures:  12/21 hemiarthroplasty   Antimicrobials:  Anti-infectives (From admission, onward)   Start     Dose/Rate Route Frequency Ordered Stop   06/11/20 1000  cephALEXin (KEFLEX) capsule 250 mg        250 mg Oral Daily 06/09/20 2154     06/09/20  2245  ceFAZolin (ANCEF) IVPB 2g/100 mL premix        2 g 200 mL/hr over 30 Minutes Intravenous Every 6 hours 06/09/20 2152 06/10/20 0550   06/09/20 1630  ceFAZolin (ANCEF) IVPB 2g/100 mL premix        2 g 200 mL/hr over 30 Minutes Intravenous On call to O.R. 06/09/20 1616 06/09/20 1744   06/09/20 1619  ceFAZolin (ANCEF) 2-4 GM/100ML-% IVPB       Note to Pharmacy: [name]   : cabinet override      06/09/20 1619 06/09/20 1746   06/08/20 1245  cephALEXin (KEFLEX) capsule 250 mg  Status:  Discontinued        250 mg Oral Daily 06/08/20 1241 06/09/20 2154     Subjective: C/o nausea again  No other complaints  Objective: Vitals:   06/21/20 0030 06/21/20 0100 06/21/20 0124 06/21/20 0700  BP: (!) 108/49 (!) 106/45 (!) 113/38 (!) 123/56  Pulse: 73 75 73 75  Resp:  18 19 18   Temp:  97.6 F (36.4 C) 98.4 F (36.9 C) 98.3 F (36.8 C)  TempSrc:  Oral  Oral  SpO2:  90% 92% 93%  Weight:      Height:        Intake/Output Summary (Last 24 hours) at 06/21/2020 1455 Last data filed at 06/21/2020 0900 Gross per 24 hour  Intake 120 ml  Output 1500 ml  Net -1380 ml   Filed Weights   06/16/20 1648 06/17/20 0500 06/19/20 0800  Weight: 78.4 kg 79.1 kg 83.8 kg    Examination:  General: No acute distress. Cardiovascular: Heart sounds show Garey Alleva regular rate, and rhythm.  Lungs: Clear to auscultation bilaterally  Abdomen: Soft, nontender, nondistended  Neurological: Alert and oriented 3. Moves all extremities 4. Cranial nerves II through XII grossly intact. Skin: Warm and dry. No rashes or lesions. Extremities: LLE with intact dressing  Data Reviewed: I have personally reviewed following labs and imaging studies  CBC: Recent Labs  Lab 06/18/20 0228 06/19/20 0609 06/19/20 0824 06/20/20 0306 06/21/20 0618  WBC 12.2* 11.3* 11.2* 15.3* 12.4*  NEUTROABS 9.8*  --   --   --   --   HGB 10.0* 10.0* 9.8* 9.7* 9.9*  HCT 32.3* 32.3* 30.0* 29.5* 30.6*  MCV 95.8 94.2 93.2 93.4 96.2  PLT 270  318 303 287 371    Basic Metabolic Panel: Recent Labs  Lab 06/18/20 0228 06/19/20 0609 06/19/20 0823 06/20/20 0306 06/21/20 0618  NA 133* 133* 132* 133* 136  K 4.1 4.5 4.5 3.9 3.7  CL 95* 94* 94* 96* 95*  CO2 25 26 23 25 26   GLUCOSE 85 79 109* 106* 91  BUN 34* 48* 50* 28* 13  CREATININE 5.75* 7.87* 7.84* 5.39* 3.71*  CALCIUM 8.9 9.1 8.9 8.3* 8.4*  MG 2.0 2.1  --  1.9 1.9  PHOS 2.9 3.5 3.5 3.4 3.2    GFR: Estimated Creatinine Clearance: 13.2 mL/min (Ramah Langhans) (by C-G formula based on SCr of 3.71 mg/dL (H)).  Liver Function Tests: Recent Labs  Lab 06/18/20 0228 06/19/20 0609 06/19/20 0823 06/20/20 0306 06/21/20 0618  AST 13* 13*  --  13* 16  ALT 5 5  --  7 8  ALKPHOS 64 76  --  73 77  BILITOT 0.6 1.0  --  0.8 0.9  PROT 5.0* 5.1*  --  5.3* 5.4*  ALBUMIN 2.1* 2.1* 2.2* 2.3* 2.2*    CBG: No results for input(s): GLUCAP in the last 168 hours.   No results found for this or any previous visit (from the past 240 hour(s)).       Radiology Studies: No results found.      Scheduled Meds: . amiodarone  200 mg Oral Daily  . calcium acetate  667 mg Oral TID WC  . cephALEXin  250 mg Oral Daily  . docusate sodium  100 mg Oral BID  . feeding supplement (NEPRO CARB STEADY)  237 mL Oral BID BM  . latanoprost  1 drop Both Eyes QHS  . multivitamin  1 tablet Oral QHS  . saccharomyces boulardii  250 mg Oral Daily  . timolol  1 drop Both Eyes Daily  . warfarin  2.5 mg Oral ONCE-1600  . Warfarin - Pharmacist Dosing Inpatient   Does not apply q1600   Continuous Infusions: . ferric gluconate (FERRLECIT/NULECIT) IV 125 mg (06/16/20 1553)  . methocarbamol (ROBAXIN) IV       LOS: 14 days    Time spent: over 30 min    Fayrene Helper, MD [facility]   To contact the attending provider between 7A-7P or the covering provider during after hours 7P-7A, please log into the web site [email or web address] and access using universal [place] password for that web site. If  you do not have the password, please call the hospital operator.  06/21/2020, 2:55 PM

## 2020-06-21 NOTE — Progress Notes (Signed)
[place] for warfarin  Indication:  Atrial fibrillation  Allergies  Allergen Reactions  . Percocet [Oxycodone-Acetaminophen] Nausea And Vomiting  . Vicodin [Hydrocodone-Acetaminophen] Nausea And Vomiting  . Chlorhexidine Rash    Pt states she gets rash similar to "sunburn"    Patient Measurements: Height: 5\' 2"  (157.5 cm) Weight: 83.8 kg (184 lb 11.9 oz) IBW/kg (Calculated) : 50.1  Vital Signs: Temp: 98.3 F (36.8 C) (01/02 0700) Temp Source: Oral (01/02 0700) BP: 123/56 (01/02 0700) Pulse Rate: 75 (01/02 0700)  Labs: Recent Labs    06/19/20 0609 06/19/20 0823 06/19/20 0824 06/20/20 0306 06/21/20 0618  HGB 10.0*  --  9.8* 9.7* 9.9*  HCT 32.3*  --  30.0* 29.5* 30.6*  PLT 318  --  303 287 293  LABPROT 36.9*  --   --  33.3* 29.7*  INR 3.9*  --   --  3.4* 2.9*  CREATININE 7.87* 7.84*  --  5.39* 3.71*    Estimated Creatinine Clearance: 13.2 mL/min (A) (by C-G formula based on SCr of 3.71 mg/dL (H)).   Medical History: History reviewed. No pertinent past medical history.  Assessment: 75 yr old female admitted on 06/07/20 with L femoral neck fracture; pt is S/P hemiarthroplasty.  Patient was taking warfarin at home prior to admission for atrial fibrillation (5 mg on Tues/Thurs, 2.5 mg on other days).  Warfarin was held for surgery, and pt was given vitamin K 5 mg IV X 1 on 12/19. Patient transitioned to heparin infusion in preparation for surgery. Heparin infusion now off and warfarin resumed 12/24.  Patient was receiving PTA regimen of warfarin while inpatient since 12/27. Today, INR 2.9 is therapeutic after holding 2 doses. No reported bleeding. Hgb 9.9.  On amiodarone PTA, continued during this admission.   Goal of Therapy:  INR 2-3   Monitor platelets by anticoagulation protocol: Yes   Plan:  - Warfarin 2.5 mg PO tonight - Monitor INR, CBC, signs/symptoms of bleeding daily   Berenice Bouton, PharmD, BCPS PGY2  Pharmacy Resident Phone between 7 am - 3:30 pm: [phone]  Please check AMION for all [place] phone numbers After 10:00 PM, call [place] [phone]  06/21/2020 9:33 AM

## 2020-06-22 DIAGNOSIS — S72002D Fracture of unspecified part of neck of left femur, subsequent encounter for closed fracture with routine healing: Secondary | ICD-10-CM | POA: Diagnosis not present

## 2020-06-22 LAB — COMPREHENSIVE METABOLIC PANEL
ALT: 7 U/L (ref 0–44)
AST: 12 U/L — ABNORMAL LOW (ref 15–41)
Albumin: 2 g/dL — ABNORMAL LOW (ref 3.5–5.0)
Alkaline Phosphatase: 75 U/L (ref 38–126)
Anion gap: 15 (ref 5–15)
BUN: 29 mg/dL — ABNORMAL HIGH (ref 8–23)
CO2: 26 mmol/L (ref 22–32)
Calcium: 8.6 mg/dL — ABNORMAL LOW (ref 8.9–10.3)
Chloride: 92 mmol/L — ABNORMAL LOW (ref 98–111)
Creatinine, Ser: 5.43 mg/dL — ABNORMAL HIGH (ref 0.44–1.00)
GFR, Estimated: 8 mL/min — ABNORMAL LOW (ref >60)
Glucose, Bld: 89 mg/dL (ref 70–99)
Potassium: 4.1 mmol/L (ref 3.5–5.1)
Sodium: 133 mmol/L — ABNORMAL LOW (ref 135–145)
Total Bilirubin: 0.6 mg/dL (ref 0.3–1.2)
Total Protein: 5.2 g/dL — ABNORMAL LOW (ref 6.5–8.1)

## 2020-06-22 LAB — CBC
HCT: 27.7 % — ABNORMAL LOW (ref 36.0–46.0)
Hemoglobin: 9 g/dL — ABNORMAL LOW (ref 12.0–15.0)
MCH: 30.5 pg (ref 26.0–34.0)
MCHC: 32.5 g/dL (ref 30.0–36.0)
MCV: 93.9 fL (ref 80.0–100.0)
Platelets: 307 10*3/uL (ref 150–400)
RBC: 2.95 MIL/uL — ABNORMAL LOW (ref 3.87–5.11)
RDW: 16.8 % — ABNORMAL HIGH (ref 11.5–15.5)
WBC: 14.9 10*3/uL — ABNORMAL HIGH (ref 4.0–10.5)
nRBC: 0 % (ref 0.0–0.2)

## 2020-06-22 LAB — PROTIME-INR
INR: 2.8 — ABNORMAL HIGH (ref 0.8–1.2)
Prothrombin Time: 28.4 seconds — ABNORMAL HIGH (ref 11.4–15.2)

## 2020-06-22 MED ORDER — WARFARIN SODIUM 2.5 MG PO TABS
2.5000 mg | ORAL_TABLET | Freq: Once | ORAL | Status: AC
  Administered 2020-06-22: 2.5 mg via ORAL
  Filled 2020-06-22: qty 1

## 2020-06-22 NOTE — Plan of Care (Signed)
°  Problem: Education: °Goal: Knowledge of General Education information will improve °Description: Including pain rating scale, medication(s)/side effects and non-pharmacologic comfort measures °Outcome: Progressing °  °Problem: Health Behavior/Discharge Planning: °Goal: Ability to manage health-related needs will improve °Outcome: Progressing °  °Problem: Clinical Measurements: °Goal: Ability to maintain clinical measurements within normal limits will improve °Outcome: Progressing °Goal: Will remain free from infection °Outcome: Progressing °Goal: Diagnostic test results will improve °Outcome: Progressing °Goal: Respiratory complications will improve °Outcome: Progressing °Goal: Cardiovascular complication will be avoided °Outcome: Progressing °  °Problem: Activity: °Goal: Risk for activity intolerance will decrease °Outcome: Progressing °  °Problem: Nutrition: °Goal: Adequate nutrition will be maintained °Outcome: Progressing °  °Problem: Coping: °Goal: Level of anxiety will decrease °Outcome: Progressing °  °Problem: Elimination: °Goal: Will not experience complications related to bowel motility °Outcome: Progressing °Goal: Will not experience complications related to urinary retention °Outcome: Progressing °  °Problem: Pain Managment: °Goal: General experience of comfort will improve °Outcome: Progressing °  °Problem: Safety: °Goal: Ability to remain free from injury will improve °Outcome: Progressing °  °Problem: Skin Integrity: °Goal: Risk for impaired skin integrity will decrease °Outcome: Progressing °  °

## 2020-06-22 NOTE — TOC Progression Note (Addendum)
Transition of Care [facility]) - Progression Note    Patient Details  Name: [name] MRN: [id number] Date of Birth: 1946-05-19  Transition of Care [facility]) CM/SW [place], LCSW Phone Number: 06/22/2020, 7:05 PM  Clinical Narrative:    CSW notified that CIR was denied by insurance. CSW will provide SNF bed offers to patient once available tomorrow and begin insurance authorization once updated PT note is in. Patient is fully vaccinated for COVID.    Expected Discharge Plan: Skilled Nursing Facility Barriers to Discharge: SNF Pending bed offer,Insurance Authorization  Expected Discharge Plan and Services Expected Discharge Plan: [place] Choice: [place] arrangements for the past 2 months: Single Family Home                                       Social Determinants of Health (SDOH) Interventions    Readmission Risk Interventions No flowsheet data found.

## 2020-06-22 NOTE — Progress Notes (Addendum)
[name] Progress Note   Subjective:  Seen in room. No overnight issues. No CP or dyspnea. Diarrhea resolved.  Objective Vitals:   06/21/20 1500 06/21/20 1932 06/22/20 0339 06/22/20 0737  BP: (!) 144/61 (!) 131/51 (!) 137/57 (!) 134/52  Pulse: 77 72 71 73  Resp: 18 18 17 18   Temp: 97.7 F (36.5 C) 98.8 F (37.1 C) 97.8 F (36.6 C) 97.7 F (36.5 C)  TempSrc: Oral Oral Oral Oral  SpO2: 96% 99% 93% 95%  Weight:      Height:       Physical Exam General:Well developed, alert female in NAD Heart:RRR, 3/6 systolic murmur Lungs:CTA anteriorly  Abdomen:Soft, non-tender Extremities:Trace L ankle edema; none in RLE. SCDs in place Dialysis Access:LUE AVF+ bruit  Additional Objective Labs: Basic Metabolic Panel: Recent Labs  Lab 06/19/20 0823 06/20/20 0306 06/21/20 0618 06/22/20 0354  NA 132* 133* 136 133*  K 4.5 3.9 3.7 4.1  CL 94* 96* 95* 92*  CO2 23 25 26 26   GLUCOSE 109* 106* 91 89  BUN 50* 28* 13 29*  CREATININE 7.84* 5.39* 3.71* 5.43*  CALCIUM 8.9 8.3* 8.4* 8.6*  PHOS 3.5 3.4 3.2  --    Liver Function Tests: Recent Labs  Lab 06/20/20 0306 06/21/20 0618 06/22/20 0354  AST 13* 16 12*  ALT 7 8 7   ALKPHOS 73 77 75  BILITOT 0.8 0.9 0.6  PROT 5.3* 5.4* 5.2*  ALBUMIN 2.3* 2.2* 2.0*   CBC: Recent Labs  Lab 06/18/20 0228 06/19/20 0609 06/19/20 0824 06/20/20 0306 06/21/20 0618 06/22/20 0354  WBC 12.2* 11.3* 11.2* 15.3* 12.4* 14.9*  NEUTROABS 9.8*  --   --   --   --   --   HGB 10.0* 10.0* 9.8* 9.7* 9.9* 9.0*  HCT 32.3* 32.3* 30.0* 29.5* 30.6* 27.7*  MCV 95.8 94.2 93.2 93.4 96.2 93.9  PLT 270 318 303 287 293 307   Medications: . ferric gluconate (FERRLECIT/NULECIT) IV 125 mg (06/16/20 1553)  . methocarbamol (ROBAXIN) IV     . amiodarone  200 mg Oral Daily  . calcium acetate  667 mg Oral TID WC  . cephALEXin  250 mg Oral Daily  . docusate sodium  100 mg Oral BID  . feeding supplement (NEPRO CARB STEADY)  237 mL Oral BID BM   . latanoprost  1 drop Both Eyes QHS  . multivitamin  1 tablet Oral QHS  . saccharomyces boulardii  250 mg Oral Daily  . timolol  1 drop Both Eyes Daily  . warfarin  2.5 mg Oral ONCE-1600  . Warfarin - Pharmacist Dosing Inpatient   Does not apply q1600    Dialysis Orders: TTS @ [facility] 4 hrs 180NRe 400/800 77.5 kg 2.0 K/ 2.0 Ca UFP 4 AVF Heparin 1000 units IV TIW - Mircera 150 mcg IV q 2 weeks (last dose 05/28/2020 last HGB 10.6 06/04/2020) - Venofer 100 mg IV X 10 doses (1/10 doses given) Tsat 19 05/28/2020.   Assessment/Plan:  1. L Hip fractures/p fall: S/p L hiphemiarthroplasty12/21/2021.Pt hoping for CIR.Per primary/Orthopedics.  2. Acute on chronic diastolic HF- Last WN02 to 55% 06/08/2020.Hadevidence of mild volume overload by exam/CXR 12/20.Volume excess resolved post HD.  3. ESRD:Usually TTS schedule,Issues with cramping. Lowered machine temp-change to UFP 2 which has helped.Please continue these orders on DC. HD tomorrow. 4. Hypertension - BPstable, but developing some mild edema - ^ UFG. 5. Anemia: Hgb9.9-Aranesp 25 mcggiven 12/26 - resume on Tuesday if Hgb still <10.She is finishing course of  IV iron. 6. Metabolic bone disease: Corr[place]awas high but improved withlow Ca bath, Phos was low - Phoslo dose reduced to 1/mealsand improved. 7. Nutrition - Albuminlow, continue Nepro. 8. PAF-on coumadin. Management per primary/pharmacy.  9. Diarrhea/C.diff colonization: improved/resolved. 10. Prolonged QTc in setting of RBBB-no benadryl or agents that could increase QTc  Addendum: s/p 2 doses IV iron here, then missed. Will d/c order for now. Can resume as outpatient after discharge. [place], PA-C 06/22/2020, 9:42 AM  [employer]

## 2020-06-22 NOTE — Progress Notes (Signed)
Inpatient Rehabilitation-Admissions Coordinator   Pt's request for CIR was denied. Notified pt who has accepted SNF placement. Left voicemail about the determination for her daughter with pt permission. AC will sign off and notify TOC team.  Raechel Ache, OTR/L  Rehab Admissions Coordinator  [phone] 06/22/2020 2:36 PM

## 2020-06-22 NOTE — Progress Notes (Signed)
Inpatient Rehabilitation-Admissions Coordinator   Still no word from insurance regarding her request for CIR. Will continue to follow.    Raechel Ache, OTR/L  Rehab Admissions Coordinator  [phone] 06/22/2020 11:50 AM

## 2020-06-22 NOTE — NC FL2 (Addendum)
[place] LEVEL OF CARE SCREENING TOOL     IDENTIFICATION  Patient Name: [name] Birthdate: May 29, 1946 Sex: female Admission Date (Current Location): 06/07/2020  [facility] [place] and [place] Number:  Herbalist and Address:  [facility] [place] [address], [place], [place] [address]      Provider Number: [id number]  Attending Physician Name and Address:  Elodia Florence., *  Relative Name and Phone Number:  [name], [phone]    Current Level of Care: Hospital Recommended Level of Care: [place] Prior Approval Number:    Date Approved/Denied:   PASRR Number: [id number]  Discharge Plan: SNF    Current Diagnoses: Patient Active Problem List   Diagnosis Date Noted   Hip fracture ([place]) 06/07/2020   Leukocytosis 06/07/2020   Acute hypoxemic respiratory failure ([place]) 06/07/2020   Atrial fibrillation ([place]) 06/07/2020   ESRD (end stage renal disease) ([place]) 06/07/2020    Orientation RESPIRATION BLADDER Height & Weight     Self,Time,Situation,Place  Normal Incontinent Weight: 184 lb 11.9 oz (83.8 kg) Height:  5\' 2"  (157.5 cm)  BEHAVIORAL SYMPTOMS/MOOD NEUROLOGICAL BOWEL NUTRITION STATUS      Incontinent Diet (See DC Summary)  AMBULATORY STATUS COMMUNICATION OF NEEDS Skin   Extensive Assist Verbally Surgical wounds (Surgical Incision L Hip w/ silicone dressing)                       Personal Care Assistance Level of Assistance  Bathing,Feeding,Dressing Bathing Assistance: Maximum assistance Feeding assistance: Limited assistance Dressing Assistance: Maximum assistance     Functional Limitations Info  Sight,Hearing,Speech Sight Info: Adequate Hearing Info: Adequate Speech Info: Adequate    SPECIAL CARE FACTORS FREQUENCY  PT (By licensed PT),OT (By licensed OT)     PT Frequency: 5x week OT Frequency: 5x week            Contractures Contractures Info: Not present     Additional Factors Info  Code Status,Allergies,Isolation Precautions Code Status Info: Full Allergies Info: Percocet (oxycodone-acetaminophen), Vicodin (hydrocodone-acetaminophen), Chlorhexidine     Isolation Precautions Info: Enteric precautions     Current Medications (06/22/2020):  This is the current hospital active medication list Current Facility-Administered Medications  Medication Dose Route Frequency Provider Last Rate Last Admin   acetaminophen (TYLENOL) tablet 650 mg  650 mg Oral Q6H PRN Merlene Pulling K, PA-C   650 mg at 06/22/20 0259   amiodarone (PACERONE) tablet 200 mg  200 mg Oral Daily Merlene Pulling K, PA-C   200 mg at 06/22/20 0935   calcium acetate (PHOSLO) capsule 667 mg  667 mg Oral TID WC Valentina Gu, NP   667 mg at 06/22/20 1714   cephALEXin (KEFLEX) capsule 250 mg  250 mg Oral Daily Caren Griffins, MD   250 mg at 06/22/20 0936   docusate sodium (COLACE) capsule 100 mg  100 mg Oral BID Elodia Florence., MD   100 mg at 06/22/20 0935   feeding supplement (NEPRO CARB STEADY) liquid 237 mL  237 mL Oral BID BM Loren Racer, PA-C   237 mL at 06/20/20 1427   hydrALAZINE (APRESOLINE) injection 5 mg  5 mg Intravenous Q4H PRN Merlene Pulling K, PA-C       HYDROmorphone (DILAUDID) injection 0.5-1 mg  0.5-1 mg Intravenous Q4H PRN Merlene Pulling K, PA-C   1 mg at 06/20/20 0440   latanoprost (XALATAN) 0.005 % ophthalmic solution 1 drop  1  drop Both Eyes QHS Merlene Pulling K, PA-C   1 drop at 06/21/20 2127   loperamide (IMODIUM) capsule 2 mg  2 mg Oral QID PRN Caren Griffins, MD   2 mg at 06/22/20 0302   menthol-cetylpyridinium (CEPACOL) lozenge 3 mg  1 lozenge Oral PRN Merlene Pulling K, PA-C       Or   phenol (CHLORASEPTIC) mouth spray 1 spray  1 spray Mouth/Throat PRN Merlene Pulling K, PA-C       methocarbamol (ROBAXIN) tablet 500 mg  500 mg Oral Q6H PRN Merlene Pulling K, PA-C   500 mg at 06/20/20 1020   Or   methocarbamol (ROBAXIN) 500 mg in  dextrose 5 % 50 mL IVPB  500 mg Intravenous Q6H PRN Merlene Pulling K, PA-C       multivitamin (RENA-VIT) tablet 1 tablet  1 tablet Oral QHS Merlene Pulling K, PA-C   1 tablet at 06/21/20 2126   ondansetron (ZOFRAN) tablet 4 mg  4 mg Oral Q6H PRN Merlene Pulling K, PA-C   4 mg at 06/21/20 0535   Or   ondansetron (ZOFRAN) injection 4 mg  4 mg Intravenous Q6H PRN Merlene Pulling K, PA-C   4 mg at 06/20/20 1034   oxyCODONE (Oxy IR/ROXICODONE) immediate release tablet 10-15 mg  10-15 mg Oral Q4H PRN Merlene Pulling K, PA-C   10 mg at 06/21/20 9201   oxyCODONE (Oxy IR/ROXICODONE) immediate release tablet 5-10 mg  5-10 mg Oral Q4H PRN Merlene Pulling K, PA-C   5 mg at 06/20/20 1216   saccharomyces boulardii (FLORASTOR) capsule 250 mg  250 mg Oral Daily Merlene Pulling K, PA-C   250 mg at 06/22/20 0935   timolol (TIMOPTIC) 0.5 % ophthalmic solution 1 drop  1 drop Both Eyes Daily Merlene Pulling K, PA-C   1 drop at 06/22/20 1135   Warfarin - Pharmacist Dosing Inpatient   Does not apply q1600 Caren Griffins, MD   Given at 06/22/20 1715     Discharge Medications: Please see discharge summary for a list of discharge medications.  Relevant Imaging Results:  Relevant Lab Results:   Additional Information SS # [id number]. Has received 3 COVID vaccines of [place]. Requires dialysis transportation TTS to [facility] on [address], [place]

## 2020-06-22 NOTE — Progress Notes (Signed)
PROGRESS NOTE    [name]  YIR:[id number] DOB: January 04, 1946 DOA: 06/07/2020 PCP: No primary care provider on file.   No chief complaint on file.  Brief Narrative:  75 year old female with history of dilated cardiomyopathy, EF of 20 to 25%, in setting of sepsis in 5009 complicated by demand ischemia, cardiac catheterization in 2014 showed normal coronaries, chronic diastolic CHF, PAF on Coumadin anticoagulation, moderate to severe aortic stenosis, tolerated TKR in/21 per cardiology may need consideration of AVR at some point, hyperlipidemia, essential hypertension, ESRD on TTS hemodialysis, GERD, migraine, glaucoma presented to the ED following mechanical fall at home with head injury and left hip pain.  In the ED she was found to be hypoxemic with O2 sats 82% on room air and placed on oxygen.  CT head was negative for acute abnormality.  X-ray of the hip showed acute left femoral neck fracture with superior subluxation.  Orthopedics was consulted and underwent repair on 06/09/2020.   Assessment & Plan:   Principal Problem:   Hip fracture ([place]) Active Problems:   Leukocytosis   Acute hypoxemic respiratory failure (HCC)   Atrial fibrillation (HCC)   ESRD (end stage renal disease) ([place])  1. Nausea  Vomiting: resolved.  2. Left femoral neck fracture: s/p left hemiarthroplasty on 06/09/2020.  Initially requested CIR however insurance will not cover.  Awaiting skilled nursing facility placement.  Pt and pt daughter requesting [name] to look into CIR again, discussed with SW. 1. Denied CIR 2. Will need to look into SNF  3. Acute hypoxemic respiratory failure-secondary to acute on chronic diastolic CHF  1. patient on ESRD, required 2 L of oxygen, s/p HD -> now on RA 2. Most recent echo showed EF of 55 to 38%, grade 2 diastolic dysfunction.  4. C. difficile colonization, loose stool-patient has history of C. difficile, C. difficile PCR was checked however was found to have positive antigen but  PCR toxin was negative.  Abdomen is soft, nontender, WBC is normal. She was having intermittent loose stools. Now resolved per patient, asking for stool softener. She has been on chronic Keflex for past 4 years.  At this time C. difficile appears to be [name] colonizer without active infection.  Continue probiotics. 1. Continue to monitor, w/u further as indicated (diarrhea/abdominal pain)  5. ESRD on TTS hemodialysis. Nephrology has been consulted.  Dialysis per nephrology.  6. Paroxysmal atrial fibrillation-patient has history of possible atrial fibrillation, she received vitamin K in preparation of surgery for supratherapeutic INR.   1. Continue warfarin per pharmacy - warfarin per pharmacy  7. Hypertension-blood pressure is stable  8. Focal osseous irregularity-seen on the chest x-ray.  There is [name] focal osseous irregularity near the left coracoid process, could be from patient positioning. X-ray of the left shoulder unremarkable per report.  9. Recurrent UTIs -continue Keflex, patient has been on Keflex for past 4 years.  10. Leukocytosis: fluctuating, no clear si/sx infection - afebrile, will continue to monitor   DVT prophylaxis: warfarin  Code Status: full  Family Communication: none at bedside Disposition:   Status is: Inpatient  Remains inpatient appropriate because:Inpatient level of care appropriate due to severity of illness   Dispo: The patient is from: Home              Anticipated d/c is to: pending              Anticipated d/c date is: > 3 days  Patient currently is not medically stable to d/c.   Consultants:   orthopedics  Procedures:  12/21 hemiarthroplasty   Antimicrobials:  Anti-infectives (From admission, onward)   Start     Dose/Rate Route Frequency Ordered Stop   06/11/20 1000  cephALEXin (KEFLEX) capsule 250 mg        250 mg Oral Daily 06/09/20 2154     06/09/20 2245  ceFAZolin (ANCEF) IVPB 2g/100 mL premix        2 g 200 mL/hr over 30  Minutes Intravenous Every 6 hours 06/09/20 2152 06/10/20 0550   06/09/20 1630  ceFAZolin (ANCEF) IVPB 2g/100 mL premix        2 g 200 mL/hr over 30 Minutes Intravenous On call to O.R. 06/09/20 1616 06/09/20 1744   06/09/20 1619  ceFAZolin (ANCEF) 2-4 GM/100ML-% IVPB       Note to Pharmacy: [name]   : cabinet override      06/09/20 1619 06/09/20 1746   06/08/20 1245  cephALEXin (KEFLEX) capsule 250 mg  Status:  Discontinued        250 mg Oral Daily 06/08/20 1241 06/09/20 2154     Subjective: Feeling ok today No nausea  Objective: Vitals:   06/21/20 1932 06/22/20 0339 06/22/20 0737 06/22/20 1500  BP: (!) 131/51 (!) 137/57 (!) 134/52 (!) 143/58  Pulse: 72 71 73 74  Resp: 18 17 18 18   Temp: 98.8 F (37.1 C) 97.8 F (36.6 C) 97.7 F (36.5 C) 98 F (36.7 C)  TempSrc: Oral Oral Oral Oral  SpO2: 99% 93% 95% 93%  Weight:      Height:        Intake/Output Summary (Last 24 hours) at 06/22/2020 1731 Last data filed at 06/22/2020 1300 Gross per 24 hour  Intake 240 ml  Output --  Net 240 ml   Filed Weights   06/16/20 1648 06/17/20 0500 06/19/20 0800  Weight: 78.4 kg 79.1 kg 83.8 kg    Examination:  General: No acute distress. Cardiovascular: Heart sounds show [name] regular rate, and rhythm Lungs: Clear to auscultation bilaterally Abdomen: Soft, nontender, nondistended Neurological: Alert and oriented 3. Moves all extremities 4. Cranial nerves II through XII grossly intact. Skin: Warm and dry. No rashes or lesions. Extremities:LLE with intact dressing   Data Reviewed: I have personally reviewed following labs and imaging studies  CBC: Recent Labs  Lab 06/18/20 0228 06/19/20 0609 06/19/20 0824 06/20/20 0306 06/21/20 0618 06/22/20 0354  WBC 12.2* 11.3* 11.2* 15.3* 12.4* 14.9*  NEUTROABS 9.8*  --   --   --   --   --   HGB 10.0* 10.0* 9.8* 9.7* 9.9* 9.0*  HCT 32.3* 32.3* 30.0* 29.5* 30.6* 27.7*  MCV 95.8 94.2 93.2 93.4 96.2 93.9  PLT 270 318 303 287 293 307     Basic Metabolic Panel: Recent Labs  Lab 06/18/20 0228 06/19/20 0609 06/19/20 0823 06/20/20 0306 06/21/20 0618 06/22/20 0354  NA 133* 133* 132* 133* 136 133*  K 4.1 4.5 4.5 3.9 3.7 4.1  CL 95* 94* 94* 96* 95* 92*  CO2 25 26 23 25 26 26   GLUCOSE 85 79 109* 106* 91 89  BUN 34* 48* 50* 28* 13 29*  CREATININE 5.75* 7.87* 7.84* 5.39* 3.71* 5.43*  CALCIUM 8.9 9.1 8.9 8.3* 8.4* 8.6*  MG 2.0 2.1  --  1.9 1.9  --   PHOS 2.9 3.5 3.5 3.4 3.2  --     GFR: Estimated Creatinine Clearance: 9 mL/min ([facility]) (by C-G  formula based on SCr of 5.43 mg/dL (H)).  Liver Function Tests: Recent Labs  Lab 06/18/20 0228 06/19/20 0609 06/19/20 0823 06/20/20 0306 06/21/20 0618 06/22/20 0354  AST 13* 13*  --  13* 16 12*  ALT 5 5  --  7 8 7   ALKPHOS 64 76  --  73 77 75  BILITOT 0.6 1.0  --  0.8 0.9 0.6  PROT 5.0* 5.1*  --  5.3* 5.4* 5.2*  ALBUMIN 2.1* 2.1* 2.2* 2.3* 2.2* 2.0*    CBG: No results for input(s): GLUCAP in the last 168 hours.   No results found for this or any previous visit (from the past 240 hour(s)).       Radiology Studies: No results found.      Scheduled Meds: . amiodarone  200 mg Oral Daily  . calcium acetate  667 mg Oral TID WC  . cephALEXin  250 mg Oral Daily  . docusate sodium  100 mg Oral BID  . feeding supplement (NEPRO CARB STEADY)  237 mL Oral BID BM  . latanoprost  1 drop Both Eyes QHS  . multivitamin  1 tablet Oral QHS  . saccharomyces boulardii  250 mg Oral Daily  . timolol  1 drop Both Eyes Daily  . Warfarin - Pharmacist Dosing Inpatient   Does not apply q1600   Continuous Infusions: . methocarbamol (ROBAXIN) IV       LOS: 15 days    Time spent: over 30 min    Fayrene Helper, MD [facility]   To contact the attending provider between 7A-7P or the covering provider during after hours 7P-7A, please log into the web site [email or web address] and access using universal [place] password for that web site. If you do not have the  password, please call the hospital operator.  06/22/2020, 5:31 PM

## 2020-06-23 DIAGNOSIS — S72002D Fracture of unspecified part of neck of left femur, subsequent encounter for closed fracture with routine healing: Secondary | ICD-10-CM | POA: Diagnosis not present

## 2020-06-23 LAB — MAGNESIUM: Magnesium: 2.1 mg/dL (ref 1.7–2.4)

## 2020-06-23 LAB — CBC
HCT: 25.5 % — ABNORMAL LOW (ref 36.0–46.0)
Hemoglobin: 8.4 g/dL — ABNORMAL LOW (ref 12.0–15.0)
MCH: 31 pg (ref 26.0–34.0)
MCHC: 32.9 g/dL (ref 30.0–36.0)
MCV: 94.1 fL (ref 80.0–100.0)
Platelets: 310 10*3/uL (ref 150–400)
RBC: 2.71 MIL/uL — ABNORMAL LOW (ref 3.87–5.11)
RDW: 16.7 % — ABNORMAL HIGH (ref 11.5–15.5)
WBC: 13.6 10*3/uL — ABNORMAL HIGH (ref 4.0–10.5)
nRBC: 0 % (ref 0.0–0.2)

## 2020-06-23 LAB — HEMOGLOBIN AND HEMATOCRIT, BLOOD
HCT: 27.2 % — ABNORMAL LOW (ref 36.0–46.0)
Hemoglobin: 8.5 g/dL — ABNORMAL LOW (ref 12.0–15.0)

## 2020-06-23 LAB — PROTIME-INR
INR: 3 — ABNORMAL HIGH (ref 0.8–1.2)
Prothrombin Time: 29.8 seconds — ABNORMAL HIGH (ref 11.4–15.2)

## 2020-06-23 LAB — COMPREHENSIVE METABOLIC PANEL
ALT: 8 U/L (ref 0–44)
AST: 13 U/L — ABNORMAL LOW (ref 15–41)
Albumin: 1.9 g/dL — ABNORMAL LOW (ref 3.5–5.0)
Alkaline Phosphatase: 65 U/L (ref 38–126)
Anion gap: 13 (ref 5–15)
BUN: 46 mg/dL — ABNORMAL HIGH (ref 8–23)
CO2: 25 mmol/L (ref 22–32)
Calcium: 8.2 mg/dL — ABNORMAL LOW (ref 8.9–10.3)
Chloride: 93 mmol/L — ABNORMAL LOW (ref 98–111)
Creatinine, Ser: 7.06 mg/dL — ABNORMAL HIGH (ref 0.44–1.00)
GFR, Estimated: 6 mL/min — ABNORMAL LOW (ref >60)
Glucose, Bld: 85 mg/dL (ref 70–99)
Potassium: 4.6 mmol/L (ref 3.5–5.1)
Sodium: 131 mmol/L — ABNORMAL LOW (ref 135–145)
Total Bilirubin: 0.6 mg/dL (ref 0.3–1.2)
Total Protein: 5 g/dL — ABNORMAL LOW (ref 6.5–8.1)

## 2020-06-23 LAB — PHOSPHORUS: Phosphorus: 4.9 mg/dL — ABNORMAL HIGH (ref 2.5–4.6)

## 2020-06-23 LAB — RESP PANEL BY RT-PCR (FLU A&B, COVID) ARPGX2
Influenza A by PCR: NEGATIVE
Influenza B by PCR: NEGATIVE
SARS Coronavirus 2 by RT PCR: NEGATIVE

## 2020-06-23 MED ORDER — WARFARIN SODIUM 1 MG PO TABS
1.0000 mg | ORAL_TABLET | Freq: Once | ORAL | Status: AC
  Administered 2020-06-23: 1 mg via ORAL
  Filled 2020-06-23: qty 1

## 2020-06-23 MED ORDER — HEPARIN SODIUM (PORCINE) 1000 UNIT/ML DIALYSIS
20.0000 [IU]/kg | INTRAMUSCULAR | Status: DC | PRN
  Filled 2020-06-23: qty 2

## 2020-06-23 NOTE — Progress Notes (Signed)
PROGRESS NOTE    [name]  FBP:[id number] DOB: 1946-06-07 DOA: 06/07/2020 PCP: No primary care provider on file.   No chief complaint on file.  Brief Narrative:  75 year old female with history of dilated cardiomyopathy, EF of 20 to 25%, in setting of sepsis in 8242 complicated by demand ischemia, cardiac catheterization in 2014 showed normal coronaries, chronic diastolic CHF, PAF on Coumadin anticoagulation, moderate to severe aortic stenosis, tolerated TKR in/21 per cardiology may need consideration of AVR at some point, hyperlipidemia, essential hypertension, ESRD on TTS hemodialysis, GERD, migraine, glaucoma presented to the ED following mechanical fall at home with head injury and left hip pain.  In the ED she was found to be hypoxemic with O2 sats 82% on room air and placed on oxygen.  CT head was negative for acute abnormality.  X-ray of the hip showed acute left femoral neck fracture with superior subluxation.  Orthopedics was consulted and underwent repair on 06/09/2020.  She's currently awaiting SNF placement. Hopefully can d/c to SNF 1/5.   Assessment & Plan:   Principal Problem:   Hip fracture ([place]) Active Problems:   Leukocytosis   Acute hypoxemic respiratory failure (HCC)   Atrial fibrillation (HCC)   ESRD (end stage renal disease) ([place])  1. Nausea  Vomiting: resolved.  2. Left femoral neck fracture: s/p left hemiarthroplasty on 06/09/2020.  Initially requested CIR however insurance will not cover.  Awaiting skilled nursing facility placement.  Pt and pt daughter requesting [name] to look into CIR again, discussed with SW. 1. Denied CIR 2. Will need to look into SNF - hopeful for 1/5  3. Anemia: gradual decline in Hb over past few days, follow.  1. Follow anemia labs 2. Transfuse as appropriate  4. Acute hypoxemic respiratory failure-secondary to acute on chronic diastolic CHF  1. patient on ESRD, required 2 L of oxygen, s/p HD -> now on RA 2. Most recent echo  showed EF of 55 to 35%, grade 2 diastolic dysfunction.  5. C. difficile colonization, loose stool-patient has history of C. difficile, C. difficile PCR was checked however was found to have positive antigen but PCR toxin was negative.  Abdomen is soft, nontender, WBC is normal. She was having intermittent loose stools. Now resolved per patient, asking for stool softener. She has been on chronic Keflex for past 4 years.  At this time C. difficile appears to be Sophina Mitten colonizer without active infection.  Continue probiotics. 1. Continue to monitor, w/u further as indicated (diarrhea/abdominal pain)  6. ESRD on TTS hemodialysis. Nephrology has been consulted.  Dialysis per nephrology.  7. Paroxysmal atrial fibrillation-patient has history of possible atrial fibrillation, she received vitamin K in preparation of surgery for supratherapeutic INR.   1. Continue warfarin per pharmacy - warfarin per pharmacy  8. Hypertension-blood pressure is stable  9. Focal osseous irregularity-seen on the chest x-ray.  There is Zion Lint focal osseous irregularity near the left coracoid process, could be from patient positioning. X-ray of the left shoulder unremarkable per report.  10. Recurrent UTIs -continue Keflex, patient has been on Keflex for past 4 years.  11. Leukocytosis: fluctuating, no clear si/sx infection - afebrile, will continue to monitor   DVT prophylaxis: warfarin  Code Status: full  Family Communication: none at bedside Disposition:   Status is: Inpatient  Remains inpatient appropriate because:Inpatient level of care appropriate due to severity of illness   Dispo: The patient is from: Home              Anticipated  d/c is to: pending              Anticipated d/c date is: > 3 days              Patient currently is not medically stable to d/c.   Consultants:   orthopedics  Procedures:  12/21 hemiarthroplasty   Antimicrobials:  Anti-infectives (From admission, onward)   Start     Dose/Rate  Route Frequency Ordered Stop   06/11/20 1000  cephALEXin (KEFLEX) capsule 250 mg        250 mg Oral Daily 06/09/20 2154     06/09/20 2245  ceFAZolin (ANCEF) IVPB 2g/100 mL premix        2 g 200 mL/hr over 30 Minutes Intravenous Every 6 hours 06/09/20 2152 06/10/20 0550   06/09/20 1630  ceFAZolin (ANCEF) IVPB 2g/100 mL premix        2 g 200 mL/hr over 30 Minutes Intravenous On call to O.R. 06/09/20 1616 06/09/20 1744   06/09/20 1619  ceFAZolin (ANCEF) 2-4 GM/100ML-% IVPB       Note to Pharmacy: [name]   : cabinet override      06/09/20 1619 06/09/20 1746   06/08/20 1245  cephALEXin (KEFLEX) capsule 250 mg  Status:  Discontinued        250 mg Oral Daily 06/08/20 1241 06/09/20 2154     Subjective: No new complaints  Objective: Vitals:   06/23/20 1000 06/23/20 1030 06/23/20 1105 06/23/20 1145  BP: (!) 116/50 (!) 95/41 (!) 100/46 (!) 101/55  Pulse: 75 72 72 71  Resp:   16 16  Temp:   98.4 F (36.9 C) 98.2 F (36.8 C)  TempSrc:   Oral Oral  SpO2:   98% 99%  Weight:   74 kg   Height:        Intake/Output Summary (Last 24 hours) at 06/23/2020 1819 Last data filed at 06/23/2020 1539 Gross per 24 hour  Intake 240 ml  Output 3650 ml  Net -3410 ml   Filed Weights   06/19/20 0800 06/23/20 0715 06/23/20 1105  Weight: 83.8 kg 77 kg 74 kg    Examination:  General: No acute distress. Cardiovascular: Heart sounds show Delylah Stanczyk regular rate, and rhythm Lungs: Clear to auscultation bilaterally Abdomen: Soft, nontender, nondistended  Neurological: Alert and oriented 3. Moves all extremities 4. Cranial nerves II through XII grossly intact. Skin: Warm and dry. No rashes or lesions. Extremities: LLE with dressing intact   Data Reviewed: I have personally reviewed following labs and imaging studies  CBC: Recent Labs  Lab 06/18/20 0228 06/19/20 0609 06/19/20 0824 06/20/20 0306 06/21/20 0618 06/22/20 0354 06/23/20 0540  WBC 12.2*   < > 11.2* 15.3* 12.4* 14.9* 13.6*  NEUTROABS  9.8*  --   --   --   --   --   --   HGB 10.0*   < > 9.8* 9.7* 9.9* 9.0* 8.4*  HCT 32.3*   < > 30.0* 29.5* 30.6* 27.7* 25.5*  MCV 95.8   < > 93.2 93.4 96.2 93.9 94.1  PLT 270   < > 303 287 293 307 310   < > = values in this interval not displayed.    Basic Metabolic Panel: Recent Labs  Lab 06/18/20 0228 06/19/20 0609 06/19/20 0823 06/20/20 0306 06/21/20 0618 06/22/20 0354 06/23/20 0540  NA 133* 133* 132* 133* 136 133* 131*  K 4.1 4.5 4.5 3.9 3.7 4.1 4.6  CL 95* 94* 94* 96* 95* 92* 93*  CO2 25 26 23 25 26 26 25   GLUCOSE 85 79 109* 106* 91 89 85  BUN 34* 48* 50* 28* 13 29* 46*  CREATININE 5.75* 7.87* 7.84* 5.39* 3.71* 5.43* 7.06*  CALCIUM 8.9 9.1 8.9 8.3* 8.4* 8.6* 8.2*  MG 2.0 2.1  --  1.9 1.9  --  2.1  PHOS 2.9 3.5 3.5 3.4 3.2  --  4.9*    GFR: Estimated Creatinine Clearance: 6.5 mL/min ([facility]hal[facility] Moriss[facility]) (by C-G formula based on SCr of 7.06 mg/dL (H)).  Liver Function Tests: Recent Labs  Lab 06/19/20 0609 06/19/20 0823 06/20/20 0306 06/21/20 0618 06/22/20 0354 06/23/20 0540  AST 13*  --  13* 16 12* 13*  ALT 5  --  7 8 7 8   ALKPHOS 76  --  73 77 75 65  BILITOT 1.0  --  0.8 0.9 0.6 0.6  PROT 5.1*  --  5.3* 5.4* 5.2* 5.0*  ALBUMIN 2.1* 2.2* 2.3* 2.2* 2.0* 1.9*    CBG: No results for input(s): GLUCAP in the last 168 hours.   No results found for this or any previous visit (from the past 240 hour(s)).       Radiology Studies: No results found.      Scheduled Meds: . amiodarone  200 mg Oral Daily  . calcium acetate  667 mg Oral TID WC  . cephALEXin  250 mg Oral Daily  . docusate sodium  100 mg Oral BID  . feeding supplement (NEPRO CARB STEADY)  237 mL Oral BID BM  . latanoprost  1 drop Both Eyes QHS  . multivitamin  1 tablet Oral QHS  . saccharomyces boulardii  250 mg Oral Daily  . timolol  1 drop Both Eyes Daily  . Warfarin - Pharmacist Dosing Inpatient   Does not apply q1600   Continuous Infusions: . methocarbamol (ROBAXIN) IV       LOS: 16 days     Time spent: over 30 min    Fayrene Helper, MD [facility]   To contact the attending provider between 7A-7P or the covering provider during after hours 7P-7A, please log into the web site [email or web address] and access using universal [place] password for that web site. If you do not have the password, please call the hospital operator.  06/23/2020, 6:19 PM

## 2020-06-23 NOTE — Progress Notes (Signed)
Physical Therapy Treatment Patient Details Name: [name] MRN: [id number] DOB: Aug 12, 1945 Today's Date: 06/23/2020    History of Present Illness Pt is a 75 y.o. female admitted 06/07/20 after fall sustaining L hip femoral neck fx. S/p posterior hemiarthroplasty 12/21. PMH includes ESRD (HD TTS), CHF, PAF, HTN, L TKA (10/2019).    PT Comments    Continuing work on functional mobility and activity tolerance;  Session focused on gait training, and she was able to take more steps today -- even with the fatigue she experiences after hemodialysis; [name] got to the edge of bed today with min assist, light mod assist to power up to stand, and min assist for in room amb. Overall progressing slwoly, but well; Anticipate continuing good progress at post-acute rehabilitation.    Follow Up Recommendations  SNF;Supervision for mobility/OOB     Equipment Recommendations  3in1 (PT);Rolling walker with 5" wheels    Recommendations for Other Services       Precautions / Restrictions Precautions Precautions: Fall;Posterior Hip Precaution Comments: pt able to state 3/3 precautions this session Restrictions LLE Weight Bearing: Weight bearing as tolerated    Mobility  Bed Mobility Overal bed mobility: Needs Assistance Bed Mobility: Supine to Sit     Supine to sit: HOB elevated;Min assist     General bed mobility comments: Min handheld assist to pull to sit; close monitor fo LLE as she tends to have a slight internally rotated position  Transfers Overall transfer level: Needs assistance Equipment used: Rolling walker (2 wheeled) Transfers: Sit to/from Stand Sit to Stand: Mod assist         General transfer comment: Light mod asssist to power up; cues to preposition for hip precautions  Ambulation/Gait Ambulation/Gait assistance: Min assist Gait Distance (Feet): 8 Feet Assistive device: Rolling walker (2 wheeled) Gait Pattern/deviations: Step-to pattern;Decreased stride  length;Decreased step length - right;Decreased weight shift to left;Antalgic;Step-through pattern;Decreased stance time - left Gait velocity: decreased   General Gait Details: Cues for sequence, and to use RW to unweigh painful LLE in stance; chair pulled behind to incr confidence   Stairs             Wheelchair Mobility    Modified Rankin (Stroke Patients Only)       Balance     Sitting balance-Leahy Scale: Fair Sitting balance - Comments: At times propping with UEs - more due to fatigue or offloading than balance.     Standing balance-Leahy Scale: Poor Standing balance comment: Requires UE support in standing with RW.                            Cognition Arousal/Alertness: Awake/alert Behavior During Therapy: WFL for tasks assessed/performed Overall Cognitive Status: Within Functional Limits for tasks assessed                                        Exercises      General Comments General comments (skin integrity, edema, etc.): Plans to dc to SNF for post-acute rehab      Pertinent Vitals/Pain Pain Assessment: 0-10 Pain Score: 4  Pain Location: Lft hip Pain Descriptors / Indicators: Discomfort;Sore;Moaning;Grimacing Pain Intervention(s): Monitored during session;Repositioned    Home Living                      Prior Function  PT Goals (current goals can now be found in the care plan section) Acute Rehab PT Goals Patient Stated Goal: To get stronger and get back to independence PT Goal Formulation: With patient/family Time For Goal Achievement: 06/24/20 Potential to Achieve Goals: Good Progress towards PT goals: Progressing toward goals    Frequency    Min 3X/week      PT Plan Current plan remains appropriate    Co-evaluation              AM-PAC PT "6 Clicks" Mobility   Outcome Measure  Help needed turning from your back to your side while in a flat bed without using bedrails?: A  Little Help needed moving from lying on your back to sitting on the side of a flat bed without using bedrails?: A Little Help needed moving to and from a bed to a chair (including a wheelchair)?: A Little Help needed standing up from a chair using your arms (e.g., wheelchair or bedside chair)?: A Lot Help needed to walk in hospital room?: A Lot Help needed climbing 3-5 steps with a railing? : A Lot 6 Click Score: 15    End of Session Equipment Utilized During Treatment: Gait belt Activity Tolerance: Patient limited by pain;Patient limited by fatigue Patient left: in chair;with call bell/phone within reach;with family/visitor present Nurse Communication: Mobility status PT Visit Diagnosis: Unsteadiness on feet (R26.81);Muscle weakness (generalized) (M62.81);Pain Pain - Right/Left: Left Pain - part of body: Hip     Time: 8403-7543 PT Time Calculation (min) (ACUTE ONLY): 15 min  Charges:  $Gait Training: 8-22 mins                     Roney Marion, PT  Acute Rehabilitation Services Pager [phone] Office [phone]    [name] 06/23/2020, 2:41 PM

## 2020-06-23 NOTE — Procedures (Signed)
I was present at this dialysis session. I have reviewed the session itself and made appropriate changes.   UF Goal 3L, BP stable.  AVF Qb 350. 2K bath. No c/o. SNF search in process.  Filed Weights   06/16/20 1648 06/17/20 0500 06/19/20 0800  Weight: 78.4 kg 79.1 kg 83.8 kg    Recent Labs  Lab 06/23/20 0540  NA 131*  K 4.6  CL 93*  CO2 25  GLUCOSE 85  BUN 46*  CREATININE 7.06*  CALCIUM 8.2*  PHOS 4.9*    Recent Labs  Lab 06/18/20 0228 06/19/20 0609 06/21/20 0618 06/22/20 0354 06/23/20 0540  WBC 12.2*   < > 12.4* 14.9* 13.6*  NEUTROABS 9.8*  --   --   --   --   HGB 10.0*   < > 9.9* 9.0* 8.4*  HCT 32.3*   < > 30.6* 27.7* 25.5*  MCV 95.8   < > 96.2 93.9 94.1  PLT 270   < > 293 307 310   < > = values in this interval not displayed.    Scheduled Meds:  amiodarone  200 mg Oral Daily   calcium acetate  667 mg Oral TID WC   cephALEXin  250 mg Oral Daily   docusate sodium  100 mg Oral BID   feeding supplement (NEPRO CARB STEADY)  237 mL Oral BID BM   latanoprost  1 drop Both Eyes QHS   multivitamin  1 tablet Oral QHS   saccharomyces boulardii  250 mg Oral Daily   timolol  1 drop Both Eyes Daily   Warfarin - Pharmacist Dosing Inpatient   Does not apply q1600   Continuous Infusions:  methocarbamol (ROBAXIN) IV     PRN Meds:.acetaminophen, heparin, hydrALAZINE, HYDROmorphone (DILAUDID) injection, loperamide, menthol-cetylpyridinium **OR** phenol, methocarbamol **OR** methocarbamol (ROBAXIN) IV, ondansetron **OR** ondansetron (ZOFRAN) IV, oxyCODONE, oxyCODONE   Pearson Grippe  MD 06/23/2020, 8:28 AM

## 2020-06-23 NOTE — Progress Notes (Signed)
PT Cancellation Note  Patient Details Name: [name] MRN: [id number] DOB: 05-Aug-1945   Cancelled Treatment:    Reason Eval/Treat Not Completed: Patient at procedure or test/unavailable   Currently in HD;   Will follow up later today as time allows;  Otherwise, will follow up for PT tomorrow;   Thank you,  Roney Marion, PT  Acute Rehabilitation Services Pager [phone] Office [phone]     Colletta [place] 06/23/2020, 9:05 AM

## 2020-06-23 NOTE — TOC Progression Note (Addendum)
Transition of Care [facility]) - Progression Note    Patient Details  Name: [name] MRN: [id number] Date of Birth: [birth year]  [facility]) CM/SW Geraldine, LCSW Phone Number: 06/23/2020, 1:39 PM  Clinical Narrative:    11:39am-CSW provided SNF bed offers to the patient. She is requesting ro talk about the options with her daughter once she arrives. Insurance is requesting an updated PT note; CSW sent message to PT.   2:45pm-CSW faxed updated PT note to insurance and requested COVID test from MD. Patient contacted CSW and requested [place]. Patient will be able to discharge tomorrow to [facility] and they have their own transport to take her to dialysis TTS 10am.    Expected Discharge Plan: [place] Barriers to Discharge: SNF Pending bed offer,Insurance Authorization  Expected Discharge Plan and Services Expected Discharge Plan: [place] Choice: [place] arrangements for the past 2 months: Single Family Home                                       Social Determinants of Health (SDOH) Interventions    Readmission Risk Interventions No flowsheet data found.

## 2020-06-23 NOTE — Progress Notes (Signed)
ANTICOAGULATION CONSULT NOTE  Pharmacy Consult:  Coumadin Indication:  Atrial fibrillation  Allergies  Allergen Reactions  . Percocet [Oxycodone-Acetaminophen] Nausea And Vomiting  . Vicodin [Hydrocodone-Acetaminophen] Nausea And Vomiting  . Chlorhexidine Rash    Pt states she gets rash similar to "sunburn"    Patient Measurements: Height: 5\' 2"  (157.5 cm) Weight: 74 kg (163 lb 2.3 oz) IBW/kg (Calculated) : 50.1  Vital Signs: Temp: 98.2 F (36.8 C) (01/04 1145) Temp Source: Oral (01/04 1145) BP: 101/55 (01/04 1145) Pulse Rate: 71 (01/04 1145)  Labs: Recent Labs    06/21/20 0618 06/22/20 0354 06/23/20 0540  HGB 9.9* 9.0* 8.4*  HCT 30.6* 27.7* 25.5*  PLT 293 307 310  LABPROT 29.7* 28.4* 29.8*  INR 2.9* 2.8* 3.0*  CREATININE 3.71* 5.43* 7.06*    Estimated Creatinine Clearance: 6.5 mL/min (A) (by C-G formula based on SCr of 7.06 mg/dL (H)).   Medical History: History reviewed. No pertinent past medical history.  Assessment: 43 YOF on Coumadin PTA for history of Afib.  Coumadin reversed on 12/19 and then transitioned to IV heparin for hemiarthroplasty for femoral neck fracture on 06/09/20.  Coumadin resumed on 06/12/20.  INR therapeutic and at the high end of normal.  Consuming </= 20% of meals.  Home Coumadin regimen:  2.5mg  PO daily except 5mg  on Tues/Thurs  Goal of Therapy:  INR 2-3   Monitor platelets by anticoagulation protocol: Yes   Plan:  Coumadin 1mg  PO today Daily PT / INR   Roddy Bellamy D. Mina Marble, PharmD, BCPS, [place] 06/23/2020, 1:16 PM

## 2020-06-23 NOTE — Plan of Care (Signed)

## 2020-06-24 ENCOUNTER — Telehealth: Payer: Self-pay | Admitting: Surgery

## 2020-06-24 DIAGNOSIS — N186 End stage renal disease: Secondary | ICD-10-CM | POA: Diagnosis not present

## 2020-06-24 DIAGNOSIS — I480 Paroxysmal atrial fibrillation: Secondary | ICD-10-CM | POA: Diagnosis not present

## 2020-06-24 DIAGNOSIS — J9601 Acute respiratory failure with hypoxia: Secondary | ICD-10-CM | POA: Diagnosis not present

## 2020-06-24 DIAGNOSIS — S72002D Fracture of unspecified part of neck of left femur, subsequent encounter for closed fracture with routine healing: Secondary | ICD-10-CM | POA: Diagnosis not present

## 2020-06-24 LAB — COMPREHENSIVE METABOLIC PANEL
ALT: 8 U/L (ref 0–44)
AST: 13 U/L — ABNORMAL LOW (ref 15–41)
Albumin: 1.9 g/dL — ABNORMAL LOW (ref 3.5–5.0)
Alkaline Phosphatase: 79 U/L (ref 38–126)
Anion gap: 13 (ref 5–15)
BUN: 27 mg/dL — ABNORMAL HIGH (ref 8–23)
CO2: 28 mmol/L (ref 22–32)
Calcium: 8.6 mg/dL — ABNORMAL LOW (ref 8.9–10.3)
Chloride: 95 mmol/L — ABNORMAL LOW (ref 98–111)
Creatinine, Ser: 4.94 mg/dL — ABNORMAL HIGH (ref 0.44–1.00)
GFR, Estimated: 9 mL/min — ABNORMAL LOW (ref >60)
Glucose, Bld: 81 mg/dL (ref 70–99)
Potassium: 4.2 mmol/L (ref 3.5–5.1)
Sodium: 136 mmol/L (ref 135–145)
Total Bilirubin: 0.5 mg/dL (ref 0.3–1.2)
Total Protein: 5.1 g/dL — ABNORMAL LOW (ref 6.5–8.1)

## 2020-06-24 LAB — CBC WITH DIFFERENTIAL/PLATELET
Abs Immature Granulocytes: 0.08 10*3/uL — ABNORMAL HIGH (ref 0.00–0.07)
Basophils Absolute: 0.1 10*3/uL (ref 0.0–0.1)
Basophils Relative: 1 %
Eosinophils Absolute: 0.3 10*3/uL (ref 0.0–0.5)
Eosinophils Relative: 2 %
HCT: 26.9 % — ABNORMAL LOW (ref 36.0–46.0)
Hemoglobin: 8.6 g/dL — ABNORMAL LOW (ref 12.0–15.0)
Immature Granulocytes: 1 %
Lymphocytes Relative: 7 %
Lymphs Abs: 0.9 10*3/uL (ref 0.7–4.0)
MCH: 30.6 pg (ref 26.0–34.0)
MCHC: 32 g/dL (ref 30.0–36.0)
MCV: 95.7 fL (ref 80.0–100.0)
Monocytes Absolute: 0.9 10*3/uL (ref 0.1–1.0)
Monocytes Relative: 7 %
Neutro Abs: 10 10*3/uL — ABNORMAL HIGH (ref 1.7–7.7)
Neutrophils Relative %: 82 %
Platelets: 346 10*3/uL (ref 150–400)
RBC: 2.81 MIL/uL — ABNORMAL LOW (ref 3.87–5.11)
RDW: 16.7 % — ABNORMAL HIGH (ref 11.5–15.5)
WBC: 12.1 10*3/uL — ABNORMAL HIGH (ref 4.0–10.5)
nRBC: 0 % (ref 0.0–0.2)

## 2020-06-24 LAB — VITAMIN B12: Vitamin B-12: 437 pg/mL (ref 180–914)

## 2020-06-24 LAB — PROTIME-INR
INR: >10 (ref 0.8–1.2)
INR: 2.6 — ABNORMAL HIGH (ref 0.8–1.2)
Prothrombin Time: >90 seconds — ABNORMAL HIGH (ref 11.4–15.2)
Prothrombin Time: 27 seconds — ABNORMAL HIGH (ref 11.4–15.2)

## 2020-06-24 LAB — IRON AND TIBC
Iron: 34 ug/dL (ref 28–170)
Saturation Ratios: 24 % (ref 10.4–31.8)
TIBC: 143 ug/dL — ABNORMAL LOW (ref 250–450)
UIBC: 109 ug/dL

## 2020-06-24 LAB — MAGNESIUM: Magnesium: 2 mg/dL (ref 1.7–2.4)

## 2020-06-24 LAB — PHOSPHORUS: Phosphorus: 3.3 mg/dL (ref 2.5–4.6)

## 2020-06-24 LAB — FOLATE: Folate: 62.4 ng/mL (ref >5.9)

## 2020-06-24 LAB — FERRITIN: Ferritin: 1400 ng/mL — ABNORMAL HIGH (ref 11–307)

## 2020-06-24 LAB — GLUCOSE, CAPILLARY: Glucose-Capillary: 86 mg/dL (ref 70–99)

## 2020-06-24 MED ORDER — WARFARIN SODIUM 2.5 MG PO TABS
2.5000 mg | ORAL_TABLET | Freq: Once | ORAL | Status: AC
  Administered 2020-06-24: 2.5 mg via ORAL
  Filled 2020-06-24: qty 1

## 2020-06-24 MED ORDER — CALCIUM ACETATE (PHOS BINDER) 667 MG PO CAPS: 667.0000 mg | ORAL_CAPSULE | Freq: Three times a day (TID) | ORAL | Status: DC

## 2020-06-24 MED ORDER — NEPRO/CARBSTEADY PO LIQD: 237.0000 mL | Freq: Two times a day (BID) | ORAL | 0 refills | Status: DC

## 2020-06-24 MED ORDER — ACETAMINOPHEN 325 MG PO TABS: 650.0000 mg | ORAL_TABLET | Freq: Four times a day (QID) | ORAL | Status: DC | PRN

## 2020-06-24 MED ORDER — DIPHENHYDRAMINE HCL 25 MG PO TABS: 25.0000 mg | ORAL_TABLET | Freq: Three times a day (TID) | ORAL | 0 refills | Status: DC | PRN

## 2020-06-24 NOTE — Progress Notes (Signed)
Tried calling report to [facility] Nurse who will be taking care of patient. Called twice and got no answer, but left a voicemail with a callback number.

## 2020-06-24 NOTE — Discharge Summary (Signed)
Discharge Summary  [name] WNU:[id number] DOB: January 04, 1946  PCP: No primary care provider on file.  Admit date: 06/07/2020 Discharge date: 06/24/2020  Time spent:40 mins  Recommendations for Outpatient Follow-up:  1. PCP 1 week 2. Orthopedics as scheduled  Discharge Diagnoses:  Active Hospital Problems   Diagnosis Date Noted  . Hip fracture ([place]) 06/07/2020  . Leukocytosis 06/07/2020  . Acute hypoxemic respiratory failure ([place]) 06/07/2020  . Atrial fibrillation ([place]) 06/07/2020  . ESRD (end stage renal disease) ([place]) 06/07/2020    Resolved Hospital Problems  No resolved problems to display.    Discharge Condition: Stable  Diet recommendation: Heart healthy  Vitals:   06/24/20 0515 06/24/20 0825  BP: (!) 132/46 (!) 134/46  Pulse: 74 77  Resp: 16   Temp: 97.7 F (36.5 C) 97.7 F (36.5 C)  SpO2: 95% 95%    History of present illness:  75 year old female with history of dilated cardiomyopathy, EF of 20 to 25%, in setting of sepsis in 0347 complicated by demand ischemia, cardiac catheterization in 2014 showed normal coronaries, chronic diastolic CHF, PAF on Coumadin anticoagulation, moderate to severe aortic stenosis, tolerated TKR in/21 per cardiology may need consideration of AVR at some point, hyperlipidemia, essential hypertension, ESRD on TTS hemodialysis, GERD, migraine, glaucoma presented to the ED following mechanical fall at home with head injury and left hip pain. In the ED she was found to be hypoxemic with O2 sats 82% on room air and placed on oxygen. CT head was negative for acute abnormality. X-ray of the hip showed acute left femoral neck fracture with superior subluxation. Orthopedics was consulted and underwent repair on 06/09/2020.    Today, patient denied any new complaints, denied worsening left hip pain, chest pain, shortness of breath, abdominal pain, nausea/vomiting, diarrhea, fever/chills.  Patient stable to be transferred to SNF for further  rehab needs    Hospital Course:  Principal Problem:   [facility]) Active Problems:   Leukocytosis   Acute hypoxemic respiratory failure (HCC)   Atrial fibrillation (HCC)   ESRD (end stage renal disease) ([place])   Left femoral neck fracture S/p left hemiarthroplasty on 06/09/2020 Follow-up with orthopedics Dr Mardelle Matte as scheduled  Normocytic anemia/anemia of chronic kidney disease Hemoglobin stable Follow-up with PCP/nephrology  Acute hypoxemic respiratory failure-secondary to acute on chronic diastolic CHF  Currently stable, on room air Most recent echo showed EF of 55 to 42%, grade 2 diastolic dysfunction Fluid management by nephrology  C. difficile colonization/history of C. Difficile C. difficile PCR showed positive antigen but PCR toxin was negative On chronic Keflex for past 4 years. At this time C. difficile appears to be a colonizer without active infection. Continue probiotics  ESRD on TTS hemodialysis HD has been arranged at SNF  Paroxysmal atrial fibrillation Continue warfarin  Hypertension BP stable  Focal osseous irregularity-seen on the chest x-ray. There is a focal osseous irregularity near the left coracoid process, could be from patient positioning. X-ray of the left shoulder unremarkable per report.  Consider repeating as an outpatient  Recurrent UTIs Continue Keflex, patient has been on Keflex for past 4 years.  Leukocytosis Afebrile Fluctuating, no clear si/sx infection      Malnutrition Type:      Malnutrition Characteristics:      Nutrition Interventions:      Estimated body mass index is 29.84 kg/m as calculated from the following:   Height as of this encounter: 5\' 2"  (1.575 m).   Weight as of this encounter: 74 kg.  Procedures: S/p left hemiarthroplasty on 06/09/2020  Consultations:  Orthopedics  Nephrology    Discharge Exam: BP (!) 134/46 (BP Location: Right Arm)   Pulse 77   Temp 97.7 F (36.5  C) (Oral)   Resp 16   Ht 5\' 2"  (1.575 m)   Wt 74 kg   SpO2 95%   BMI 29.84 kg/m   General: NAD Cardiovascular: S1, S2 present Respiratory: CTA B Extremities: BLE with no edema, L hip dressing intact   Discharge Instructions You were cared for by a hospitalist during your hospital stay. If you have any questions about your discharge medications or the care you received while you were in the hospital after you are discharged, you can call the unit and asked to speak with the hospitalist on call if the hospitalist that took care of you is not available. Once you are discharged, your primary care physician will handle any further medical issues. Please note that NO REFILLS for any discharge medications will be authorized once you are discharged, as it is imperative that you return to your primary care physician (or establish a relationship with a primary care physician if you do not have one) for your aftercare needs so that they can reassess your need for medications and monitor your lab values.  Discharge Instructions    Diet - low sodium heart healthy   Complete by: As directed    Discharge wound care:   Complete by: As directed    Wound care as per Orthopedics   Increase activity slowly   Complete by: As directed      Allergies as of 06/24/2020      Reactions   Percocet [oxycodone-acetaminophen] Nausea And Vomiting   Vicodin [hydrocodone-acetaminophen] Nausea And Vomiting   Chlorhexidine Rash   Pt states she gets rash similar to "sunburn"      Medication List    TAKE these medications   acetaminophen 325 MG tablet Commonly known as: TYLENOL Take 2 tablets (650 mg total) by mouth every 6 (six) hours as needed for mild pain, fever or headache.   amiodarone 200 MG tablet Commonly known as: PACERONE Take 200 mg by mouth daily.   bimatoprost 0.01 % Soln Commonly known as: LUMIGAN Place 1 drop into both eyes at bedtime.   calcium acetate 667 MG capsule Commonly known as:  PHOSLO Take 1 capsule (667 mg total) by mouth 3 (three) times daily with meals. What changed:   how much to take  when to take this   cephALEXin 250 MG capsule Commonly known as: KEFLEX Take 250 mg by mouth daily.   diphenhydrAMINE 25 MG tablet Commonly known as: BENADRYL Take 1 tablet (25 mg total) by mouth every 8 (eight) hours as needed. What changed:   when to take this  reasons to take this   feeding supplement (NEPRO CARB STEADY) Liqd Take 237 mLs by mouth 2 (two) times daily between meals.   lidocaine-prilocaine cream Commonly known as: EMLA Apply 1 application topically 3 (three) times a week.   loperamide 2 MG tablet Commonly known as: IMODIUM A-D Take 2 mg by mouth 4 (four) times daily as needed for diarrhea or loose stools.   multivitamin Tabs tablet Take 1 tablet by mouth daily.   saccharomyces boulardii 250 MG capsule Commonly known as: FLORASTOR Take 250 mg by mouth daily.   timolol 0.5 % ophthalmic solution Commonly known as: BETIMOL Place 1 drop into both eyes daily.   warfarin 5 MG tablet Commonly known as:  COUMADIN Take 2.5-5 mg by mouth as directed. Take 1 tablet (5 mg) on (Tues & Thurs) & Take 1/2 tablet (2.5 mg) on all other days            Discharge Care Instructions  (From admission, onward)         Start     Ordered   06/24/20 0000  Discharge wound care:       Comments: Wound care as per Orthopedics   06/24/20 0959         Allergies  Allergen Reactions  . Percocet [Oxycodone-Acetaminophen] Nausea And Vomiting  . Vicodin [Hydrocodone-Acetaminophen] Nausea And Vomiting  . Chlorhexidine Rash    Pt states she gets rash similar to "sunburn"    Follow-up Information    Marchia Bond, MD. Schedule an appointment as soon as possible for a visit in 2 weeks.   Specialty: Orthopedic Surgery Contact information: [address] [place] [address] [phone]                The results of  significant diagnostics from this hospitalization (including imaging, microbiology, ancillary and laboratory) are listed below for reference.    Significant Diagnostic Studies: CT Head Wo Contrast  Result Date: 06/07/2020 CLINICAL DATA:  Status post trauma. EXAM: CT HEAD WITHOUT CONTRAST TECHNIQUE: Contiguous axial images were obtained from the base of the skull through the vertex without intravenous contrast. COMPARISON:  April 11, 2012 FINDINGS: Brain: There is mild cerebral atrophy with widening of the extra-axial spaces and ventricular dilatation. There are areas of decreased attenuation within the white matter tracts of the supratentorial brain, consistent with microvascular disease changes. Vascular: No hyperdense vessel or unexpected calcification. Skull: Normal. Negative for fracture or focal lesion. Sinuses/Orbits: No acute finding. Other: None. IMPRESSION: No acute intracranial pathology. Electronically Signed   By: Virgina Norfolk M.D.   On: 06/07/2020 21:10   DG Pelvis Portable  Result Date: 06/07/2020 CLINICAL DATA:  Initial evaluation for acute trauma, fall. EXAM: PORTABLE PELVIS 1-2 VIEWS COMPARISON:  None. FINDINGS: Acute fracture extends through the left femoral neck with superior subluxation. Left femoral head remains normally position within the acetabulum. Femoral head intact. No associated acetabular fracture. Bony pelvis intact. Limited views of the right hip demonstrate no acute osseous abnormality. No visible soft tissue injury. Underlying osteopenia. Mild degenerative changes noted within the lower lumbar spine. IMPRESSION: Acute fracture of the left femoral neck with superior subluxation. Electronically Signed   By: Jeannine Boga M.D.   On: 06/07/2020 20:44   DG Chest Portable 1 View  Result Date: 06/07/2020 CLINICAL DATA:  Initial evaluation for acute trauma, fall. EXAM: PORTABLE CHEST 1 VIEW COMPARISON:  Prior radiograph from 04/23/2018. FINDINGS: Cardiomegaly.  Mediastinal silhouette within normal limits. Moderate sized hiatal hernia noted. Lungs normally inflated. Perihilar vascular congestion without overt pulmonary edema. No visible pleural effusion. No focal infiltrates. No pneumothorax. There is question of focal osseous irregularity near the left coracoid process, favored to be secondary to patient positioning. No other acute osseous abnormality. Underlying osteopenia. IMPRESSION: 1. Question focal osseous irregularity near the left coracoid process, favored to be secondary to patient positioning. Correlation with physical exam recommended. Further assessment with dedicated radiograph of the left shoulder recommended as clinically warranted. 2. Cardiomegaly with perihilar vascular congestion without overt pulmonary edema. 3. Moderate sized hiatal hernia. Electronically Signed   By: Jeannine Boga M.D.   On: 06/07/2020 20:47   DG Shoulder Left  Result Date: 06/15/2020 CLINICAL DATA:  Coracoid  impingement EXAM: LEFT SHOULDER - 2+ VIEW COMPARISON:  06/07/2020 FINDINGS: Internal rotation, external rotation, and transscapular views of the left shoulder are obtained. No fracture, subluxation, or dislocation. The density seen at the base of the coracoid on prior study simply reflects trabecular markings, with no underlying bony abnormality. Joint spaces are well preserved. Left chest is clear. IMPRESSION: 1. Unremarkable left shoulder. Electronically Signed   By: Randa Ngo M.D.   On: 06/15/2020 17:51   ECHOCARDIOGRAM COMPLETE  Result Date: 06/08/2020    ECHOCARDIOGRAM REPORT   Patient Name:   [name] Date of Exam: 06/08/2020 Medical Rec #:  [id number]     Height:       62.0 in Accession #:    [id number]    Weight:       173.0 lb Date of Birth:  May 03, 1946      BSA:          1.797 m Patient Age:    4 years      BP:           143/66 mmHg Patient Gender: F             HR:           73 bpm. Exam Location:  Inpatient Procedure: 2D Echo, Cardiac Doppler  and Color Doppler Indications:    I50.33 Acute on chronic diastolic (congestive) heart failure  History:        Patient has no prior history of Echocardiogram examinations.  Sonographer:    Jonelle Sidle Dance Referring Phys: [id number] [place]  1. Left ventricular ejection fraction, by estimation, is 50 to 55%. The left ventricle has low normal function. The left ventricle has no regional wall motion abnormalities. There is mild left ventricular hypertrophy. Left ventricular diastolic parameters are consistent with Grade II diastolic dysfunction (pseudonormalization). Elevated left ventricular end-diastolic pressure. The E/e' is 21.  2. Right ventricular systolic function is mildly reduced. The right ventricular size is normal. There is moderately elevated pulmonary artery systolic pressure. The estimated right ventricular systolic pressure is 38.1 mmHg.  3. Left atrial size was severely dilated.  4. Right atrial size was moderately dilated.  5. The mitral valve is abnormal. Mild to moderate mitral valve regurgitation.  6. The tricuspid valve is abnormal. Tricuspid valve regurgitation is moderate to severe.  7. The aortic valve is calcified. Aortic valve regurgitation is trivial. Severe aortic valve stenosis. Aortic valve area, by VTI measures 0.60 cm. Aortic valve mean gradient measures 38.0 mmHg. Aortic valve Vmax measures 4.09 m/s. Peak gradient 67 mmHg. DI is 0.21.  8. The inferior vena cava is dilated in size with >50% respiratory variability, suggesting right atrial pressure of 8 mmHg. Comparison(s): No prior Echocardiogram. Conclusion(s)/Recommendation(s): Findings concerning for congestive heart failure and severe aortic stenosis, would recommend structural heart team evaluation. FINDINGS  Left Ventricle: Left ventricular ejection fraction, by estimation, is 50 to 55%. The left ventricle has low normal function. The left ventricle has no regional wall motion abnormalities. The left ventricular  internal cavity size was normal in size. There is mild left ventricular hypertrophy. Left ventricular diastolic parameters are consistent with Grade II diastolic dysfunction (pseudonormalization). Elevated left ventricular end-diastolic pressure. The E/e' is 21. Right Ventricle: The right ventricular size is normal. No increase in right ventricular wall thickness. Right ventricular systolic function is mildly reduced. There is moderately elevated pulmonary artery systolic pressure. The tricuspid regurgitant velocity is 3.32 m/s, and with an assumed right atrial pressure of 8 mmHg,  the estimated right ventricular systolic pressure is 95.1 mmHg. Left Atrium: Left atrial size was severely dilated. Right Atrium: Right atrial size was moderately dilated. Pericardium: There is no evidence of pericardial effusion. Mitral Valve: The mitral valve is abnormal. Moderately decreased mobility of the posterior mitral valve leaflet. Mild to moderate mitral annular calcification. Mild to moderate mitral valve regurgitation. Tricuspid Valve: The tricuspid valve is abnormal. Tricuspid valve regurgitation is moderate to severe. Aortic Valve: The aortic valve is calcified. Aortic valve regurgitation is trivial. Severe aortic stenosis is present. Aortic valve mean gradient measures 38.0 mmHg. Aortic valve peak gradient measures 66.9 mmHg. Aortic valve area, by VTI measures 0.60 cm. Pulmonic Valve: The pulmonic valve was grossly normal. Pulmonic valve regurgitation is trivial. Aorta: The aortic root and ascending aorta are structurally normal, with no evidence of dilitation. Venous: The inferior vena cava is dilated in size with greater than 50% respiratory variability, suggesting right atrial pressure of 8 mmHg. IAS/Shunts: No atrial level shunt detected by color flow Doppler.  LEFT VENTRICLE PLAX 2D LVIDd:         4.60 cm  Diastology LVIDs:         3.80 cm  LV e' medial:    5.22 cm/s LV PW:         1.30 cm  LV E/e' medial:  25.9 LV  IVS:        1.20 cm  LV e' lateral:   8.05 cm/s LVOT diam:     1.90 cm  LV E/e' lateral: 16.8 LV SV:         62 LV SV Index:   35 LVOT Area:     2.84 cm  RIGHT VENTRICLE            IVC RV Basal diam:  3.90 cm    IVC diam: 2.30 cm RV Mid diam:    2.50 cm RV S prime:     7.40 cm/s TAPSE (M-mode): 1.8 cm LEFT ATRIUM              Index       RIGHT ATRIUM           Index LA diam:        5.30 cm  2.95 cm/m  RA Area:     22.30 cm LA Vol (A2C):   119.0 ml 66.21 ml/m RA Volume:   70.40 ml  39.17 ml/m LA Vol (A4C):   112.0 ml 62.31 ml/m LA Biplane Vol: 117.0 ml 65.09 ml/m  AORTIC VALVE AV Area (Vmax):    0.71 cm AV Area (Vmean):   0.67 cm AV Area (VTI):     0.60 cm AV Vmax:           409.00 cm/s AV Vmean:          284.250 cm/s AV VTI:            1.030 m AV Peak Grad:      66.9 mmHg AV Mean Grad:      38.0 mmHg LVOT Vmax:         103.00 cm/s LVOT Vmean:        67.100 cm/s LVOT VTI:          0.219 m LVOT/AV VTI ratio: 0.21  AORTA Ao Root diam: 3.00 cm Ao Asc diam:  3.20 cm MITRAL VALVE                 TRICUSPID VALVE MV Area (PHT): 3.53 cm  TR Peak grad:   44.1 mmHg MV Decel Time: 215 msec      TR Vmax:        332.00 cm/s MR Peak grad:    170.0 mmHg MR Mean grad:    106.0 mmHg  SHUNTS MR Vmax:         652.00 cm/s Systemic VTI:  0.22 m MR Vmean:        483.0 cm/s  Systemic Diam: 1.90 cm MR PISA:         1.57 cm MR PISA Eff ROA: 10 mm MR PISA Radius:  0.50 cm MV E velocity: 135.00 cm/s MV A velocity: 88.80 cm/s MV E/A ratio:  1.52 Lyman Bishop MD Electronically signed by Lyman Bishop MD Signature Date/Time: 06/08/2020/1:14:13 PM    Final    DG Hip Port Unilat With Pelvis 1V Left  Result Date: 06/09/2020 CLINICAL DATA:  Status post fracture open reduction and internal fixation. EXAM: DG HIP (WITH OR WITHOUT PELVIS) 1V PORT LEFT COMPARISON:  None. FINDINGS: There is no evidence of an acute hip fracture or dislocation. A left hip replacement is noted. There is no evidence of surrounding lucency to suggest the  presence of hardware loosening or infection. A mild to moderate amount of soft tissue air is seen adjacent to the proximal and medial portions of the right femoral shaft. Moderate severity lateral left hip soft tissue swelling is also seen. IMPRESSION: 1. Left hip replacement without evidence of hardware loosening or infection. Electronically Signed   By: Virgina Norfolk M.D.   On: 06/09/2020 20:49    Microbiology: Recent Results (from the past 240 hour(s))  Resp Panel by RT-PCR (Flu A&B, Covid) Nasopharyngeal Swab     Status: None   Collection Time: 06/23/20  6:18 PM   Specimen: Nasopharyngeal Swab; Nasopharyngeal(NP) swabs in vial transport medium  Result Value Ref Range Status   SARS Coronavirus 2 by RT PCR NEGATIVE NEGATIVE Final    Comment: (NOTE) SARS-CoV-2 target nucleic acids are NOT DETECTED.  The SARS-CoV-2 RNA is generally detectable in upper respiratory specimens during the acute phase of infection. The lowest concentration of SARS-CoV-2 viral copies this assay can detect is 138 copies/mL. A negative result does not preclude SARS-Cov-2 infection and should not be used as the sole basis for treatment or other patient management decisions. A negative result may occur with  improper specimen collection/handling, submission of specimen other than nasopharyngeal swab, presence of viral mutation(s) within the areas targeted by this assay, and inadequate number of viral copies(<138 copies/mL). A negative result must be combined with clinical observations, patient history, and epidemiological information. The expected result is Negative.  Fact Sheet for Patients:  EntrepreneurPulse.com.au  Fact Sheet for Healthcare Providers:  IncredibleEmployment.be  This test is no t yet approved or cleared by the [place] FDA and  has been authorized for detection and/or diagnosis of SARS-CoV-2 by FDA under an Emergency Use Authorization (EUA). This  EUA will remain  in effect (meaning this test can be used) for the duration of the COVID-19 declaration under Section 564(b)(1) of the Act, 21 U.S.C.section 360bbb-3(b)(1), unless the authorization is terminated  or revoked sooner.       Influenza A by PCR NEGATIVE NEGATIVE Final   Influenza B by PCR NEGATIVE NEGATIVE Final    Comment: (NOTE) The Xpert Xpress SARS-CoV-2/FLU/RSV plus assay is intended as an aid in the diagnosis of influenza from Nasopharyngeal swab specimens and should not be used as a sole basis for treatment. Nasal  washings and aspirates are unacceptable for Xpert Xpress SARS-CoV-2/FLU/RSV testing.  Fact Sheet for Patients: EntrepreneurPulse.com.au  Fact Sheet for Healthcare Providers: IncredibleEmployment.be  This test is not yet approved or cleared by the [place] FDA and has been authorized for detection and/or diagnosis of SARS-CoV-2 by FDA under an Emergency Use Authorization (EUA). This EUA will remain in effect (meaning this test can be used) for the duration of the COVID-19 declaration under Section 564(b)(1) of the Act, 21 U.S.C. section 360bbb-3(b)(1), unless the authorization is terminated or revoked.  Performed at [facility], [place] [address], [place], [place] [address]      Labs: Basic Metabolic Panel: Recent Labs  Lab 06/19/20 [phone] 06/19/20 0823 06/20/20 0306 06/21/20 0618 06/22/20 0354 06/23/20 0540 06/24/20 0148  NA 133* 132* 133* 136 133* 131* 136  K 4.5 4.5 3.9 3.7 4.1 4.6 4.2  CL 94* 94* 96* 95* 92* 93* 95*  CO2 26 23 25 26 26 25 28   GLUCOSE 79 109* 106* 91 89 85 81  BUN 48* 50* 28* 13 29* 46* 27*  CREATININE 7.87* 7.84* 5.39* 3.71* 5.43* 7.06* 4.94*  CALCIUM 9.1 8.9 8.3* 8.4* 8.6* 8.2* 8.6*  MG 2.1  --  1.9 1.9  --  2.1 2.0  PHOS 3.5 3.5 3.4 3.2  --  4.9* 3.3   Liver Function Tests: Recent Labs  Lab 06/20/20 0306 06/21/20 0618 06/22/20 0354 06/23/20 0540 06/24/20 0148   AST 13* 16 12* 13* 13*  ALT 7 8 7 8 8   ALKPHOS 73 77 75 65 79  BILITOT 0.8 0.9 0.6 0.6 0.5  PROT 5.3* 5.4* 5.2* 5.0* 5.1*  ALBUMIN 2.3* 2.2* 2.0* 1.9* 1.9*   No results for input(s): LIPASE, AMYLASE in the last 168 hours. No results for input(s): AMMONIA in the last 168 hours. CBC: Recent Labs  Lab 06/18/20 0228 06/19/20 0609 06/20/20 0306 06/21/20 0618 06/22/20 0354 06/23/20 0540 06/23/20 1816 06/24/20 0148  WBC 12.2*   < > 15.3* 12.4* 14.9* 13.6*  --  12.1*  NEUTROABS 9.8*  --   --   --   --   --   --  10.0*  HGB 10.0*   < > 9.7* 9.9* 9.0* 8.4* 8.5* 8.6*  HCT 32.3*   < > 29.5* 30.6* 27.7* 25.5* 27.2* 26.9*  MCV 95.8   < > 93.4 96.2 93.9 94.1  --  95.7  PLT 270   < > 287 293 307 310  --  346   < > = values in this interval not displayed.   Cardiac Enzymes: No results for input(s): CKTOTAL, CKMB, CKMBINDEX, TROPONINI in the last 168 hours. BNP: BNP (last 3 results) Recent Labs    06/08/20 0525  BNP 2,447.3*    ProBNP (last 3 results) No results for input(s): PROBNP in the last 8760 hours.  CBG: Recent Labs  Lab 06/24/20 0654  GLUCAP 86       Signed:  Alma Friendly, MD [facility] 06/24/2020, 10:06 AM

## 2020-06-24 NOTE — Discharge Instructions (Signed)
INSTRUCTIONS AFTER JOINT REPLACEMENT   o Remove items at home which could result in a fall. This includes throw rugs or furniture in walking pathways o ICE to the affected joint every three hours while awake for 30 minutes at a time, for at least the first 3-5 days, and then as needed for pain and swelling.  Continue to use ice for pain and swelling. You may notice swelling that will progress down to the foot and ankle.  This is normal after surgery.  Elevate your leg when you are not up walking on it.   o Continue to use the breathing machine you got in the hospital (incentive spirometer) which will help keep your temperature down.  It is common for your temperature to cycle up and down following surgery, especially at night when you are not up moving around and exerting yourself.  The breathing machine keeps your lungs expanded and your temperature down.   DIET:  As you were doing prior to hospitalization, we recommend a well-balanced diet.  DRESSING / WOUND CARE / SHOWERING  You may change your dressing 3-5 days after surgery.  Then change the dressing every day with sterile gauze.  Please use good hand washing techniques before changing the dressing.  Do not use any lotions or creams on the incision until instructed by your surgeon.  ACTIVITY  o Increase activity slowly as tolerated, but follow the weight bearing instructions below.   o No driving for 6 weeks or until further direction given by your physician.  You cannot drive while taking narcotics.  o No lifting or carrying greater than 10 lbs. until further directed by your surgeon. o Avoid periods of inactivity such as sitting longer than an hour when not asleep. This helps prevent blood clots.  o You may return to work once you are authorized by your doctor.     WEIGHT BEARING   Weight bearing as tolerated with assist device (walker, cane, etc) as directed, use it as long as suggested by your surgeon or therapist, typically at  least 4-6 weeks.   EXERCISES  Results after joint replacement surgery are often greatly improved when you follow the exercise, range of motion and muscle strengthening exercises prescribed by your doctor. Safety measures are also important to protect the joint from further injury. Any time any of these exercises cause you to have increased pain or swelling, decrease what you are doing until you are comfortable again and then slowly increase them. If you have problems or questions, call your caregiver or physical therapist for advice.   Rehabilitation is important following a joint replacement. After just a few days of immobilization, the muscles of the leg can become weakened and shrink (atrophy).  These exercises are designed to build up the tone and strength of the thigh and leg muscles and to improve motion. Often times heat used for twenty to thirty minutes before working out will loosen up your tissues and help with improving the range of motion but do not use heat for the first two weeks following surgery (sometimes heat can increase post-operative swelling).   These exercises can be done on a training (exercise) mat, on the floor, on a table or on a bed. Use whatever works the best and is most comfortable for you.    Use music or television while you are exercising so that the exercises are a pleasant break in your day. This will make your life better with the exercises acting as a break   in your routine that you can look forward to.   Perform all exercises about fifteen times, three times per day or as directed.  You should exercise both the operative leg and the other leg as well.  Exercises include:   . Quad Sets - Tighten up the muscle on the front of the thigh (Quad) and hold for 5-10 seconds.   . Straight Leg Raises - With your knee straight (if you were given a brace, keep it on), lift the leg to 60 degrees, hold for 3 seconds, and slowly lower the leg.  Perform this exercise against  resistance later as your leg gets stronger.  . Leg Slides: Lying on your back, slowly slide your foot toward your buttocks, bending your knee up off the floor (only go as far as is comfortable). Then slowly slide your foot back down until your leg is flat on the floor again.  . Angel Wings: Lying on your back spread your legs to the side as far apart as you can without causing discomfort.  . Hamstring Strength:  Lying on your back, push your heel against the floor with your leg straight by tightening up the muscles of your buttocks.  Repeat, but this time bend your knee to a comfortable angle, and push your heel against the floor.  You may put a pillow under the heel to make it more comfortable if necessary.   A rehabilitation program following joint replacement surgery can speed recovery and prevent re-injury in the future due to weakened muscles. Contact your doctor or a physical therapist for more information on knee rehabilitation.    CONSTIPATION  Constipation is defined medically as fewer than three stools per week and severe constipation as less than one stool per week.  Even if you have a regular bowel pattern at home, your normal regimen is likely to be disrupted due to multiple reasons following surgery.  Combination of anesthesia, postoperative narcotics, change in appetite and fluid intake all can affect your bowels.   YOU MUST use at least one of the following options; they are listed in order of increasing strength to get the job done.  They are all available over the counter, and you may need to use some, POSSIBLY even all of these options:    Drink plenty of fluids (prune juice may be helpful) and high fiber foods Colace 100 mg by mouth twice a day  Senokot for constipation as directed and as needed Dulcolax (bisacodyl), take with full glass of water  Miralax (polyethylene glycol) once or twice a day as needed.  If you have tried all these things and are unable to have a bowel  movement in the first 3-4 days after surgery call either your surgeon or your primary doctor.    If you experience loose stools or diarrhea, hold the medications until you stool forms back up.  If your symptoms do not get better within 1 week or if they get worse, check with your doctor.  If you experience "the worst abdominal pain ever" or develop nausea or vomiting, please contact the office immediately for further recommendations for treatment.   ITCHING:  If you experience itching with your medications, try taking only a single pain pill, or even half a pain pill at a time.  You can also use Benadryl over the counter for itching or also to help with sleep.   TED HOSE STOCKINGS:  Use stockings on both legs until for at least 2 weeks or as   directed by physician office. They may be removed at night for sleeping.  MEDICATIONS:  See your medication summary on the "After Visit Summary" that nursing will review with you.  You may have some home medications which will be placed on hold until you complete the course of blood thinner medication.  It is important for you to complete the blood thinner medication as prescribed.  PRECAUTIONS:  If you experience chest pain or shortness of breath - call 911 immediately for transfer to the hospital emergency department.   If you develop a fever greater that 101 F, purulent drainage from wound, increased redness or drainage from wound, foul odor from the wound/dressing, or calf pain - CONTACT YOUR SURGEON.                                                   FOLLOW-UP APPOINTMENTS:  If you do not already have a post-op appointment, please call the office for an appointment to be seen by your surgeon.  Guidelines for how soon to be seen are listed in your "After Visit Summary", but are typically between 1-4 weeks after surgery.  OTHER INSTRUCTIONS:   Knee Replacement:  Do not place pillow under knee, focus on keeping the knee straight while resting.   DENTAL  ANTIBIOTICS:  In most cases prophylactic antibiotics for Dental procdeures after total joint surgery are not necessary.  Exceptions are as follows:  1. History of prior total joint infection  2. Severely immunocompromised (Organ Transplant, cancer chemotherapy, Rheumatoid biologic meds such as Hooker)  3. Poorly controlled diabetes (A1C &gt; 8.0, blood glucose over 200)  If you have one of these conditions, contact your surgeon for an antibiotic prescription, prior to your dental procedure.   MAKE SURE YOU:  . Understand these instructions.  . Get help right away if you are not doing well or get worse.    Thank you for letting us be a part of your medical care team.  It is a privilege we respect greatly.  We hope these instructions will help you stay on track for a fast and full recovery!      Information on my medicine - Coumadin   (Warfarin)  This medication education was reviewed with me or my healthcare representative as part of my discharge preparation.    Why was Coumadin prescribed for you? Coumadin was prescribed for you because you have a blood clot or a medical condition that can cause an increased risk of forming blood clots. Blood clots can cause serious health problems by blocking the flow of blood to the heart, lung, or brain. Coumadin can prevent harmful blood clots from forming. As a reminder your indication for Coumadin is:   Stroke Prevention Because Of Atrial Fibrillation  What test will check on my response to Coumadin? While on Coumadin (warfarin) you will need to have an INR test regularly to ensure that your dose is keeping you in the desired range. The INR (international normalized ratio) number is calculated from the result of the laboratory test called prothrombin time (PT).  If an INR APPOINTMENT HAS NOT ALREADY BEEN MADE FOR YOU please schedule an appointment to have this lab work done by your health care provider within 7 days. Your INR goal is  usually a number between:  2 to 3 or your provider may give you  a more narrow range like 2-2.5.  Ask your health care provider during an office visit what your goal INR is.  What  do you need to  know  About  COUMADIN? Take Coumadin (warfarin) exactly as prescribed by your healthcare provider about the same time each day.  DO NOT stop taking without talking to the doctor who prescribed the medication.  Stopping without other blood clot prevention medication to take the place of Coumadin may increase your risk of developing a new clot or stroke.  Get refills before you run out.  What do you do if you miss a dose? If you miss a dose, take it as soon as you remember on the same day then continue your regularly scheduled regimen the next day.  Do not take two doses of Coumadin at the same time.  Important Safety Information A possible side effect of Coumadin (Warfarin) is an increased risk of bleeding. You should call your healthcare provider right away if you experience any of the following: ? Bleeding from an injury or your nose that does not stop. ? Unusual colored urine (red or dark brown) or unusual colored stools (red or black). ? Unusual bruising for unknown reasons. ? A serious fall or if you hit your head (even if there is no bleeding).  Some foods or medicines interact with Coumadin (warfarin) and might alter your response to warfarin. To help avoid this: ? Eat a balanced diet, maintaining a consistent amount of Vitamin K. ? Notify your provider about major diet changes you plan to make. ? Avoid alcohol or limit your intake to 1 drink for women and 2 drinks for men per day. (1 drink is 5 oz. wine, 12 oz. beer, or 1.5 oz. liquor.)  Make sure that ANY health care provider who prescribes medication for you knows that you are taking Coumadin (warfarin).  Also make sure the healthcare provider who is monitoring your Coumadin knows when you have started a new medication including herbals and  non-prescription products.  Coumadin (Warfarin)  Major Drug Interactions  Increased Warfarin Effect Decreased Warfarin Effect  Alcohol (large quantities) Antibiotics (esp. Septra/Bactrim, Flagyl, Cipro) Amiodarone (Cordarone) Aspirin (ASA) Cimetidine (Tagamet) Megestrol (Megace) NSAIDs (ibuprofen, naproxen, etc.) Piroxicam (Feldene) Propafenone (Rythmol SR) Propranolol (Inderal) Isoniazid (INH) Posaconazole (Noxafil) Barbiturates (Phenobarbital) Carbamazepine (Tegretol) Chlordiazepoxide (Librium) Cholestyramine (Questran) Griseofulvin Oral Contraceptives Rifampin Sucralfate (Carafate) Vitamin K   Coumadin (Warfarin) Major Herbal Interactions  Increased Warfarin Effect Decreased Warfarin Effect  Garlic Ginseng Ginkgo biloba Coenzyme Q10 Green tea St. John's wort    Coumadin (Warfarin) FOOD Interactions  Eat a consistent number of servings per week of foods HIGH in Vitamin K (1 serving =  cup)  Collards (cooked, or boiled & drained) Kale (cooked, or boiled & drained) Mustard greens (cooked, or boiled & drained) Parsley *serving size only =  cup Spinach (cooked, or boiled & drained) Swiss chard (cooked, or boiled & drained) Turnip greens (cooked, or boiled & drained)  Eat a consistent number of servings per week of foods MEDIUM-HIGH in Vitamin K (1 serving = 1 cup)  Asparagus (cooked, or boiled & drained) Broccoli (cooked, boiled & drained, or raw & chopped) Brussel sprouts (cooked, or boiled & drained) *serving size only =  cup Lettuce, raw (green leaf, endive, romaine) Spinach, raw Turnip greens, raw & chopped   These websites have more information on Coumadin (warfarin):  FailFactory.se; VeganReport.com.au;

## 2020-06-24 NOTE — TOC Progression Note (Signed)
Transition of Care [facility]) - Progression Note    Patient Details  Name: [name] MRN: [id number] Date of Birth: [birth year]  Transition of Care [facility]) CM/SW [place], LCSW Phone Number: 06/24/2020, 8:53 AM  Clinical Narrative:    CSW received insurance approval for patient to go to [place] today: #[id number], effective 06/23/20-06/25/20.   Expected Discharge Plan: Skilled Nursing Facility Barriers to Discharge: SNF Pending bed offer,Insurance Authorization  Expected Discharge Plan and Services Expected Discharge Plan: [place] Choice: [place] arrangements for the past 2 months: Single Family Home                                       Social Determinants of Health (SDOH) Interventions    Readmission Risk Interventions No flowsheet data found.

## 2020-06-24 NOTE — Progress Notes (Signed)
ANTICOAGULATION CONSULT NOTE  Pharmacy Consult:  Coumadin Indication:  Atrial fibrillation  Allergies  Allergen Reactions  . Percocet [Oxycodone-Acetaminophen] Nausea And Vomiting  . Vicodin [Hydrocodone-Acetaminophen] Nausea And Vomiting  . Chlorhexidine Rash    Pt states she gets rash similar to "sunburn"    Patient Measurements: Height: 5\' 2"  (157.5 cm) Weight: 74 kg (163 lb 2.3 oz) IBW/kg (Calculated) : 50.1  Vital Signs: Temp: 97.7 F (36.5 C) (01/05 0515) Temp Source: Oral (01/05 0515) BP: 132/46 (01/05 0515) Pulse Rate: 74 (01/05 0515)  Labs: Recent Labs    06/22/20 0354 06/23/20 0540 06/23/20 1816 06/24/20 0148 06/24/20 0442  HGB 9.0* 8.4* 8.5* 8.6*  --   HCT 27.7* 25.5* 27.2* 26.9*  --   PLT 307 310  --  346  --   LABPROT 28.4* 29.8*  --  >90.0* 27.0*  INR 2.8* 3.0*  --  >10.0* 2.6*  CREATININE 5.43* 7.06*  --  4.94*  --     Estimated Creatinine Clearance: 9.3 mL/min (A) (by C-G formula based on SCr of 4.94 mg/dL (H)).   Medical History: History reviewed. No pertinent past medical history.  Assessment: 28 YOF on Coumadin PTA for history of Afib.  Coumadin reversed on 12/19 and then transitioned to IV heparin for hemiarthroplasty for femoral neck fracture on 06/09/20.  Coumadin resumed on 06/12/20.  INR therapeutic and trending down post reduced Coumadin dose on 06/24/19.  Consuming </= 50% of meals.  Home Coumadin regimen:  2.5mg  PO daily except 5mg  on Tues/Thurs  Goal of Therapy:  INR 2-3   Monitor platelets by anticoagulation protocol: Yes   Plan:  Coumadin 2.5mg  PO today Daily PT / INR   Finn Altemose D. Mina Marble, PharmD, BCPS, [place] 06/24/2020, 7:56 AM

## 2020-06-24 NOTE — Care Management Important Message (Signed)
Important Message  Patient Details  Name: [name] MRN: [id number] Date of Birth: 1945/10/30   Medicare Important Message Given:  Yes     Grove Defina P [place] 06/24/2020, 12:50 PM

## 2020-06-24 NOTE — Progress Notes (Signed)
[facility] Progress Note   Subjective:  Seen in room - no overnight events, tells me plan is for discharge today.  Objective Vitals:   06/23/20 1145 06/23/20 2200 06/24/20 0515 06/24/20 0825  BP: (!) 101/55 (!) 137/45 (!) 132/46 (!) 134/46  Pulse: 71 70 74 77  Resp: 16 17 16    Temp: 98.2 F (36.8 C) 98.4 F (36.9 C) 97.7 F (36.5 C) 97.7 F (36.5 C)  TempSrc: Oral Oral Oral Oral  SpO2: 99% 99% 95% 95%  Weight:      Height:       Physical Exam General:Well developed, alert female in NAD Heart:RRR, 3/6 systolic murmur Lungs:CTA anteriorly  Abdomen:Soft, non-tender Extremities:No LE edema Dialysis Access:LUE AVF+ bruit  Additional Objective Labs: Basic Metabolic Panel: Recent Labs  Lab 06/21/20 0618 06/22/20 0354 06/23/20 0540 06/24/20 0148  NA 136 133* 131* 136  K 3.7 4.1 4.6 4.2  CL 95* 92* 93* 95*  CO2 26 26 25 28   GLUCOSE 91 89 85 81  BUN 13 29* 46* 27*  CREATININE 3.71* 5.43* 7.06* 4.94*  CALCIUM 8.4* 8.6* 8.2* 8.6*  PHOS 3.2  --  4.9* 3.3   Liver Function Tests: Recent Labs  Lab 06/22/20 0354 06/23/20 0540 06/24/20 0148  AST 12* 13* 13*  ALT 7 8 8   ALKPHOS 75 65 79  BILITOT 0.6 0.6 0.5  PROT 5.2* 5.0* 5.1*  ALBUMIN 2.0* 1.9* 1.9*   CBC: Recent Labs  Lab 06/18/20 0228 06/19/20 0609 06/20/20 0306 06/21/20 0618 06/22/20 0354 06/23/20 0540 06/23/20 1816 06/24/20 0148  WBC 12.2*   < > 15.3* 12.4* 14.9* 13.6*  --  12.1*  NEUTROABS 9.8*  --   --   --   --   --   --  10.0*  HGB 10.0*   < > 9.7* 9.9* 9.0* 8.4* 8.5* 8.6*  HCT 32.3*   < > 29.5* 30.6* 27.7* 25.5* 27.2* 26.9*  MCV 95.8   < > 93.4 96.2 93.9 94.1  --  95.7  PLT 270   < > 287 293 307 310  --  346   < > = values in this interval not displayed.   Medications: . methocarbamol (ROBAXIN) IV     . amiodarone  200 mg Oral Daily  . calcium acetate  667 mg Oral TID WC  . cephALEXin  250 mg Oral Daily  . docusate sodium  100 mg Oral BID  . feeding supplement  (NEPRO CARB STEADY)  237 mL Oral BID BM  . latanoprost  1 drop Both Eyes QHS  . multivitamin  1 tablet Oral QHS  . saccharomyces boulardii  250 mg Oral Daily  . timolol  1 drop Both Eyes Daily  . warfarin  2.5 mg Oral ONCE-1600  . Warfarin - Pharmacist Dosing Inpatient   Does not apply q1600    Dialysis Orders: TTS @ [facility] 4 hrs 180NRe 400/800 77.5 kg 2.0 K/ 2.0 Ca UFP 4 AVF Heparin 1000 units IV TIW - Mircera 150 mcg IV q 2 weeks (last dose 05/28/2020 last HGB 10.6 06/04/2020) - Venofer 100 mg IV X 10 doses (1/10 doses given) Tsat 19 05/28/2020.   Assessment/Plan:  1. L Hip fractures/p fall: S/p L hiphemiarthroplasty12/21/2021.Pt hoping for CIR.Per primary/Orthopedics.  2. Acute on chronic diastolic HF- Last JT70 to 55% 06/08/2020.Hadevidence of mild volume overload by exam/CXR 12/20.Volume excessresolvedpost HD.  3. ESRD:Usually TTS schedule,Issues with cramping. Lowered machine temp-change to UFP 2 which has helped.Please continue these orders on  DC.HD tomorrow. 4. Hypertension - BPstable, but has lost some weight, will lower EDW. 5. Anemia: Hgb9.9-Aranesp 25 mcggiven 12/26. 6. Metabolic bone disease: CorrCawas high but improved withlow Ca bath, Phos was low - Phoslo dose reduced to 1/mealsand improved. 7. Nutrition - Albuminlow, continue Nepro. 8. PAF-on coumadin. Management per primary/pharmacy.  9. Diarrhea/C.diff colonization: improved/resolved. 10. Prolonged QTc in setting of RBBB-no benadryl or agents that could increase QTc   [name], PA-C 06/24/2020, 10:40 AM  [employer]

## 2020-06-24 NOTE — Plan of Care (Addendum)
CRITICAL VALUE ALERT  Critical Value:  INR >10   PT >90  Date & Time Notied:  06/24/2020 @0415   Provider Notified: Rachael Fee  Orders Received/Actions taken: Redraw  New labs drawn PT 27.0 INR 2.6  Problem: Education: Goal: Knowledge of General Education information will improve Description: Including pain rating scale, medication(s)/side effects and non-pharmacologic comfort measures Outcome: Progressing   Problem: Activity: Goal: Risk for activity intolerance will decrease Outcome: Progressing   Problem: Pain Managment: Goal: General experience of comfort will improve Outcome: Progressing   Problem: Safety: Goal: Ability to remain free from injury will improve Outcome: Progressing   Problem: Skin Integrity: Goal: Risk for impaired skin integrity will decrease Outcome: Progressing

## 2020-06-24 NOTE — TOC Transition Note (Signed)
Transition of Care [facility]) - CM/SW Discharge Note   Patient Details  Name: [name] MRN: [id number] Date of Birth: 27-Jan-1946  Transition of Care [facility]) CM/SW Contact:  Benard Halsted, LCSW Phone Number: 06/24/2020, 11:12 AM   Clinical Narrative:    Patient will DC to: [place] Anticipated DC date: 06/24/20 Family notified: Daughter, Arts administrator by: Corey Harold (after paperwork is completed at 12pm)   Per MD patient ready for DC to [facility]. RN to call report prior to discharge ([phone] Room 113B). RN, patient, patient's family, and facility notified of DC. Discharge Summary and FL2 sent to facility. DC packet on chart. Ambulance transport requested for patient.   CSW will sign off for now as social work intervention is no longer needed. Please consult [name] again if new needs arise.      Final next level of care: Skilled Nursing Facility Barriers to Discharge: Barriers Resolved   Patient Goals and CMS Choice Patient states their goals for this hospitalization and ongoing recovery are:: Pt agreeable to SNF placement and prefers [facility]. CMS Medicare.gov Compare Post Acute Care list provided to:: Patient Choice offered to / list presented to : Patient  Discharge Placement   Existing PASRR number confirmed : 06/24/20          Patient chooses bed at: [place] Patient to be transferred to facility by: [facility] Name of family member notified: Daughter Patient and family notified of of transfer: 06/24/20  Discharge Plan and Services     Post Acute Care Choice: [place]                               Social Determinants of Health (SDOH) Interventions     Readmission Risk Interventions No flowsheet data found.

## 2020-06-24 NOTE — Telephone Encounter (Signed)
ED CM received call from RN on 5N concerning [facility] needing a copy of Discharge Summary. DC summary faxed to [facility].

## 2020-06-24 NOTE — Progress Notes (Signed)
Occupational Therapy Treatment Patient Details Name: Charliene Inoue MRN: [id number] DOB: 1946/03/20 Today's Date: 06/24/2020    History of present illness Pt is a 75 y.o. female admitted 06/07/20 after fall sustaining L hip femoral neck fx. S/p posterior hemiarthroplasty 12/21. PMH includes ESRD (HD TTS), CHF, PAF, HTN, L TKA (10/2019).   OT comments  Pt progressing well towards OT goals, able to demo mobility to/from bathroom using RW at min guard and minimal pain. Pt min guard for ADLs standing at sink but continues to require increased assist for LB ADLs due to posterior hip precautions. Began education on AE for ADLs with handout provided. Plan to instruct in AE use during next session to maximize independence. Pt able to return demo UE HEP well using resistive bands to maximize strength and endurance. SNF for short term rehab remains appropriate.    Follow Up Recommendations  SNF    Equipment Recommendations  Tub/shower seat;3 in 1 bedside commode    Recommendations for Other Services      Precautions / Restrictions Precautions Precautions: Fall;Posterior Hip Precaution Booklet Issued: Yes (comment) Precaution Comments: pt able to state 3/3 precautions this session Restrictions Weight Bearing Restrictions: Yes LLE Weight Bearing: Weight bearing as tolerated       Mobility Bed Mobility Overal bed mobility: Needs Assistance Bed Mobility: Supine to Sit     Supine to sit: HOB elevated;Min guard     General bed mobility comments: min guard for safety in operative LE advancement  Transfers Overall transfer level: Needs assistance Equipment used: Rolling walker (2 wheeled) Transfers: Sit to/from Stand Sit to Stand: Min assist         General transfer comment: light Min A for power up, good carryover of hand placement    Balance Overall balance assessment: Needs assistance Sitting-balance support: Feet supported;No upper extremity supported Sitting balance-Leahy Scale:  Fair     Standing balance support: Bilateral upper extremity supported;Single extremity supported;During functional activity Standing balance-Leahy Scale: Poor Standing balance comment: reliant on at least one UE support during standing ADLs                           ADL either performed or assessed with clinical judgement   ADL Overall ADL's : Needs assistance/impaired     Grooming: Min guard;Standing;Brushing hair Grooming Details (indicate cue type and reason): min guard for safety in standing at sink with RW             Lower Body Dressing: Moderate assistance;Sit to/from stand Lower Body Dressing Details (indicate cue type and reason): Mod A overall. Able to reach non-operative LE well but decreased flexibiliy in L LE. Educated on use of sock aide with handout provided - plan to instruct in use of AE during next session.             Functional mobility during ADLs: Min guard;Rolling walker General ADL Comments: Pt with improving pain and endurance     Vision   Vision Assessment?: No apparent visual deficits   Perception     Praxis      Cognition Arousal/Alertness: Awake/alert Behavior During Therapy: WFL for tasks assessed/performed Overall Cognitive Status: Within Functional Limits for tasks assessed                                          Exercises Exercises: General Upper  Extremity General Exercises - Upper Extremity Shoulder Flexion: Strengthening;Both;5 reps;Seated;Theraband Theraband Level (Shoulder Flexion): Level 1 (Yellow) Shoulder Horizontal ABduction: Strengthening;Both;5 reps;Seated;Theraband Theraband Level (Shoulder Horizontal Abduction): Level 1 (Yellow) Elbow Flexion: Strengthening;Both;5 reps;Seated;Theraband Theraband Level (Elbow Flexion): Level 1 (Yellow) Elbow Extension: Strengthening;Both;5 reps;Seated;Theraband Theraband Level (Elbow Extension): Level 1 (Yellow)   Shoulder Instructions        General Comments Provided UE HEP handout and instruction on exercises, as well as AE handout with problem solving continued for LB ADLs during next sessions    Pertinent Vitals/ Pain       Pain Assessment: 0-10 Pain Score: 2  Pain Location: Lft hip Pain Descriptors / Indicators: Discomfort;Sore Pain Intervention(s): Monitored during session  Home Living                                          Prior Functioning/Environment              Frequency  Min 2X/week        Progress Toward Goals  OT Goals(current goals can now be found in the care plan section)  Progress towards OT goals: Progressing toward goals  Acute Rehab OT Goals Patient Stated Goal: To get stronger and get back to independence OT Goal Formulation: With patient Time For Goal Achievement: 06/26/20 Potential to Achieve Goals: Good ADL Goals Pt Will Perform Lower Body Dressing: with adaptive equipment;with supervision;sit to/from stand Pt Will Transfer to Toilet: with supervision;ambulating Pt Will Perform Toileting - Clothing Manipulation and hygiene: with supervision;sit to/from stand Pt/caregiver will Perform Home Exercise Program: Both right and left upper extremity;Increased strength;With theraband;With Supervision Additional ADL Goal #1: Patient will perform 10 min functional activity or exercise activity as evidence of improving activity tolerance Additional ADL Goal #2: Patient will stand at sink to perform grooming task as evidence of improving activity tolerance and balance  Plan Discharge plan remains appropriate;Frequency remains appropriate    Co-evaluation                 AM-PAC OT "6 Clicks" Daily Activity     Outcome Measure   Help from another person eating meals?: None Help from another person taking care of personal grooming?: A Little Help from another person toileting, which includes using toliet, bedpan, or urinal?: A Lot Help from another person bathing  (including washing, rinsing, drying)?: A Lot Help from another person to put on and taking off regular upper body clothing?: A Little Help from another person to put on and taking off regular lower body clothing?: A Lot 6 Click Score: 16    End of Session Equipment Utilized During Treatment: Gait belt;Rolling walker  OT Visit Diagnosis: Unsteadiness on feet (R26.81);Other abnormalities of gait and mobility (R26.89);History of falling (Z91.81);Pain Pain - Right/Left: Left Pain - part of body: Hip   Activity Tolerance Patient tolerated treatment well   Patient Left in chair;with call bell/phone within reach;with chair alarm set   Nurse Communication Mobility status        Time: 7482-7078 OT Time Calculation (min): 37 min  Charges: OT General Charges $OT Visit: 1 Visit OT Treatments $Self Care/Home Management : 8-22 mins $Therapeutic Exercise: 8-22 mins  Layla Maw, OTR/L   Layla Maw 06/24/2020, 10:37 AM

## 2020-06-25 DIAGNOSIS — M84359A Stress fracture, hip, unspecified, initial encounter for fracture: Secondary | ICD-10-CM | POA: Insufficient documentation

## 2020-06-25 DIAGNOSIS — N186 End stage renal disease: Secondary | ICD-10-CM | POA: Diagnosis not present

## 2020-06-25 DIAGNOSIS — E039 Hypothyroidism, unspecified: Secondary | ICD-10-CM | POA: Diagnosis not present

## 2020-06-25 DIAGNOSIS — Z5181 Encounter for therapeutic drug level monitoring: Secondary | ICD-10-CM | POA: Diagnosis not present

## 2020-06-25 DIAGNOSIS — I482 Chronic atrial fibrillation, unspecified: Secondary | ICD-10-CM | POA: Diagnosis not present

## 2020-06-25 DIAGNOSIS — D688 Other specified coagulation defects: Secondary | ICD-10-CM | POA: Diagnosis not present

## 2020-06-25 DIAGNOSIS — R197 Diarrhea, unspecified: Secondary | ICD-10-CM | POA: Diagnosis not present

## 2020-06-25 DIAGNOSIS — N2581 Secondary hyperparathyroidism of renal origin: Secondary | ICD-10-CM | POA: Diagnosis not present

## 2020-06-25 DIAGNOSIS — D631 Anemia in chronic kidney disease: Secondary | ICD-10-CM | POA: Diagnosis not present

## 2020-06-25 DIAGNOSIS — U071 COVID-19: Secondary | ICD-10-CM | POA: Diagnosis not present

## 2020-06-25 DIAGNOSIS — R519 Headache, unspecified: Secondary | ICD-10-CM | POA: Diagnosis not present

## 2020-06-25 DIAGNOSIS — I120 Hypertensive chronic kidney disease with stage 5 chronic kidney disease or end stage renal disease: Secondary | ICD-10-CM | POA: Diagnosis not present

## 2020-06-25 DIAGNOSIS — D509 Iron deficiency anemia, unspecified: Secondary | ICD-10-CM | POA: Diagnosis not present

## 2020-06-25 DIAGNOSIS — T7840XA Allergy, unspecified, initial encounter: Secondary | ICD-10-CM | POA: Diagnosis not present

## 2020-06-25 DIAGNOSIS — Z992 Dependence on renal dialysis: Secondary | ICD-10-CM | POA: Diagnosis not present

## 2020-06-26 DIAGNOSIS — N186 End stage renal disease: Secondary | ICD-10-CM | POA: Diagnosis not present

## 2020-06-26 DIAGNOSIS — I5032 Chronic diastolic (congestive) heart failure: Secondary | ICD-10-CM | POA: Diagnosis not present

## 2020-06-26 DIAGNOSIS — I480 Paroxysmal atrial fibrillation: Secondary | ICD-10-CM | POA: Diagnosis not present

## 2020-06-26 DIAGNOSIS — D649 Anemia, unspecified: Secondary | ICD-10-CM | POA: Diagnosis not present

## 2020-06-27 DIAGNOSIS — R519 Headache, unspecified: Secondary | ICD-10-CM | POA: Diagnosis not present

## 2020-06-27 DIAGNOSIS — Z992 Dependence on renal dialysis: Secondary | ICD-10-CM | POA: Diagnosis not present

## 2020-06-27 DIAGNOSIS — E039 Hypothyroidism, unspecified: Secondary | ICD-10-CM | POA: Diagnosis not present

## 2020-06-27 DIAGNOSIS — D631 Anemia in chronic kidney disease: Secondary | ICD-10-CM | POA: Diagnosis not present

## 2020-06-27 DIAGNOSIS — R197 Diarrhea, unspecified: Secondary | ICD-10-CM | POA: Diagnosis not present

## 2020-06-27 DIAGNOSIS — D688 Other specified coagulation defects: Secondary | ICD-10-CM | POA: Diagnosis not present

## 2020-06-27 DIAGNOSIS — D509 Iron deficiency anemia, unspecified: Secondary | ICD-10-CM | POA: Diagnosis not present

## 2020-06-27 DIAGNOSIS — Z5181 Encounter for therapeutic drug level monitoring: Secondary | ICD-10-CM | POA: Diagnosis not present

## 2020-06-27 DIAGNOSIS — N2581 Secondary hyperparathyroidism of renal origin: Secondary | ICD-10-CM | POA: Diagnosis not present

## 2020-06-27 DIAGNOSIS — T7840XA Allergy, unspecified, initial encounter: Secondary | ICD-10-CM | POA: Diagnosis not present

## 2020-06-27 DIAGNOSIS — I482 Chronic atrial fibrillation, unspecified: Secondary | ICD-10-CM | POA: Diagnosis not present

## 2020-06-27 DIAGNOSIS — N186 End stage renal disease: Secondary | ICD-10-CM | POA: Diagnosis not present

## 2020-06-30 DIAGNOSIS — D509 Iron deficiency anemia, unspecified: Secondary | ICD-10-CM | POA: Diagnosis not present

## 2020-06-30 DIAGNOSIS — E039 Hypothyroidism, unspecified: Secondary | ICD-10-CM | POA: Diagnosis not present

## 2020-06-30 DIAGNOSIS — R197 Diarrhea, unspecified: Secondary | ICD-10-CM | POA: Diagnosis not present

## 2020-06-30 DIAGNOSIS — Z992 Dependence on renal dialysis: Secondary | ICD-10-CM | POA: Diagnosis not present

## 2020-06-30 DIAGNOSIS — N186 End stage renal disease: Secondary | ICD-10-CM | POA: Diagnosis not present

## 2020-06-30 DIAGNOSIS — T7840XA Allergy, unspecified, initial encounter: Secondary | ICD-10-CM | POA: Diagnosis not present

## 2020-06-30 DIAGNOSIS — Z5181 Encounter for therapeutic drug level monitoring: Secondary | ICD-10-CM | POA: Diagnosis not present

## 2020-06-30 DIAGNOSIS — D688 Other specified coagulation defects: Secondary | ICD-10-CM | POA: Diagnosis not present

## 2020-06-30 DIAGNOSIS — I482 Chronic atrial fibrillation, unspecified: Secondary | ICD-10-CM | POA: Diagnosis not present

## 2020-06-30 DIAGNOSIS — R519 Headache, unspecified: Secondary | ICD-10-CM | POA: Diagnosis not present

## 2020-06-30 DIAGNOSIS — N2581 Secondary hyperparathyroidism of renal origin: Secondary | ICD-10-CM | POA: Diagnosis not present

## 2020-06-30 DIAGNOSIS — D631 Anemia in chronic kidney disease: Secondary | ICD-10-CM | POA: Diagnosis not present

## 2020-07-01 DIAGNOSIS — S72002D Fracture of unspecified part of neck of left femur, subsequent encounter for closed fracture with routine healing: Secondary | ICD-10-CM | POA: Diagnosis not present

## 2020-07-02 DIAGNOSIS — D688 Other specified coagulation defects: Secondary | ICD-10-CM | POA: Diagnosis not present

## 2020-07-02 DIAGNOSIS — Z992 Dependence on renal dialysis: Secondary | ICD-10-CM | POA: Diagnosis not present

## 2020-07-02 DIAGNOSIS — D509 Iron deficiency anemia, unspecified: Secondary | ICD-10-CM | POA: Diagnosis not present

## 2020-07-02 DIAGNOSIS — N186 End stage renal disease: Secondary | ICD-10-CM | POA: Diagnosis not present

## 2020-07-02 DIAGNOSIS — Z5181 Encounter for therapeutic drug level monitoring: Secondary | ICD-10-CM | POA: Diagnosis not present

## 2020-07-02 DIAGNOSIS — I5032 Chronic diastolic (congestive) heart failure: Secondary | ICD-10-CM | POA: Diagnosis not present

## 2020-07-02 DIAGNOSIS — D649 Anemia, unspecified: Secondary | ICD-10-CM | POA: Diagnosis not present

## 2020-07-02 DIAGNOSIS — R197 Diarrhea, unspecified: Secondary | ICD-10-CM | POA: Diagnosis not present

## 2020-07-02 DIAGNOSIS — N2581 Secondary hyperparathyroidism of renal origin: Secondary | ICD-10-CM | POA: Diagnosis not present

## 2020-07-02 DIAGNOSIS — T7840XA Allergy, unspecified, initial encounter: Secondary | ICD-10-CM | POA: Diagnosis not present

## 2020-07-02 DIAGNOSIS — E039 Hypothyroidism, unspecified: Secondary | ICD-10-CM | POA: Diagnosis not present

## 2020-07-02 DIAGNOSIS — I480 Paroxysmal atrial fibrillation: Secondary | ICD-10-CM | POA: Diagnosis not present

## 2020-07-02 DIAGNOSIS — I482 Chronic atrial fibrillation, unspecified: Secondary | ICD-10-CM | POA: Diagnosis not present

## 2020-07-02 DIAGNOSIS — D631 Anemia in chronic kidney disease: Secondary | ICD-10-CM | POA: Diagnosis not present

## 2020-07-02 DIAGNOSIS — R519 Headache, unspecified: Secondary | ICD-10-CM | POA: Diagnosis not present

## 2020-07-04 DIAGNOSIS — D509 Iron deficiency anemia, unspecified: Secondary | ICD-10-CM | POA: Diagnosis not present

## 2020-07-04 DIAGNOSIS — T7840XA Allergy, unspecified, initial encounter: Secondary | ICD-10-CM | POA: Diagnosis not present

## 2020-07-04 DIAGNOSIS — Z992 Dependence on renal dialysis: Secondary | ICD-10-CM | POA: Diagnosis not present

## 2020-07-04 DIAGNOSIS — Z5181 Encounter for therapeutic drug level monitoring: Secondary | ICD-10-CM | POA: Diagnosis not present

## 2020-07-04 DIAGNOSIS — D688 Other specified coagulation defects: Secondary | ICD-10-CM | POA: Diagnosis not present

## 2020-07-04 DIAGNOSIS — E039 Hypothyroidism, unspecified: Secondary | ICD-10-CM | POA: Diagnosis not present

## 2020-07-04 DIAGNOSIS — I482 Chronic atrial fibrillation, unspecified: Secondary | ICD-10-CM | POA: Diagnosis not present

## 2020-07-04 DIAGNOSIS — N186 End stage renal disease: Secondary | ICD-10-CM | POA: Diagnosis not present

## 2020-07-04 DIAGNOSIS — R197 Diarrhea, unspecified: Secondary | ICD-10-CM | POA: Diagnosis not present

## 2020-07-04 DIAGNOSIS — R519 Headache, unspecified: Secondary | ICD-10-CM | POA: Diagnosis not present

## 2020-07-04 DIAGNOSIS — N2581 Secondary hyperparathyroidism of renal origin: Secondary | ICD-10-CM | POA: Diagnosis not present

## 2020-07-04 DIAGNOSIS — D631 Anemia in chronic kidney disease: Secondary | ICD-10-CM | POA: Diagnosis not present

## 2020-07-07 DIAGNOSIS — D509 Iron deficiency anemia, unspecified: Secondary | ICD-10-CM | POA: Diagnosis not present

## 2020-07-07 DIAGNOSIS — Z992 Dependence on renal dialysis: Secondary | ICD-10-CM | POA: Diagnosis not present

## 2020-07-07 DIAGNOSIS — R519 Headache, unspecified: Secondary | ICD-10-CM | POA: Diagnosis not present

## 2020-07-07 DIAGNOSIS — T7840XA Allergy, unspecified, initial encounter: Secondary | ICD-10-CM | POA: Diagnosis not present

## 2020-07-07 DIAGNOSIS — D631 Anemia in chronic kidney disease: Secondary | ICD-10-CM | POA: Diagnosis not present

## 2020-07-07 DIAGNOSIS — Z5181 Encounter for therapeutic drug level monitoring: Secondary | ICD-10-CM | POA: Diagnosis not present

## 2020-07-07 DIAGNOSIS — D688 Other specified coagulation defects: Secondary | ICD-10-CM | POA: Diagnosis not present

## 2020-07-07 DIAGNOSIS — E039 Hypothyroidism, unspecified: Secondary | ICD-10-CM | POA: Diagnosis not present

## 2020-07-07 DIAGNOSIS — N2581 Secondary hyperparathyroidism of renal origin: Secondary | ICD-10-CM | POA: Diagnosis not present

## 2020-07-07 DIAGNOSIS — I482 Chronic atrial fibrillation, unspecified: Secondary | ICD-10-CM | POA: Diagnosis not present

## 2020-07-07 DIAGNOSIS — R197 Diarrhea, unspecified: Secondary | ICD-10-CM | POA: Diagnosis not present

## 2020-07-07 DIAGNOSIS — N186 End stage renal disease: Secondary | ICD-10-CM | POA: Diagnosis not present

## 2020-07-09 DIAGNOSIS — D509 Iron deficiency anemia, unspecified: Secondary | ICD-10-CM | POA: Diagnosis not present

## 2020-07-09 DIAGNOSIS — Z5181 Encounter for therapeutic drug level monitoring: Secondary | ICD-10-CM | POA: Diagnosis not present

## 2020-07-09 DIAGNOSIS — N186 End stage renal disease: Secondary | ICD-10-CM | POA: Diagnosis not present

## 2020-07-09 DIAGNOSIS — I482 Chronic atrial fibrillation, unspecified: Secondary | ICD-10-CM | POA: Diagnosis not present

## 2020-07-09 DIAGNOSIS — E039 Hypothyroidism, unspecified: Secondary | ICD-10-CM | POA: Diagnosis not present

## 2020-07-09 DIAGNOSIS — D631 Anemia in chronic kidney disease: Secondary | ICD-10-CM | POA: Diagnosis not present

## 2020-07-09 DIAGNOSIS — N2581 Secondary hyperparathyroidism of renal origin: Secondary | ICD-10-CM | POA: Diagnosis not present

## 2020-07-09 DIAGNOSIS — T7840XA Allergy, unspecified, initial encounter: Secondary | ICD-10-CM | POA: Diagnosis not present

## 2020-07-09 DIAGNOSIS — R519 Headache, unspecified: Secondary | ICD-10-CM | POA: Diagnosis not present

## 2020-07-09 DIAGNOSIS — D688 Other specified coagulation defects: Secondary | ICD-10-CM | POA: Diagnosis not present

## 2020-07-09 DIAGNOSIS — R197 Diarrhea, unspecified: Secondary | ICD-10-CM | POA: Diagnosis not present

## 2020-07-09 DIAGNOSIS — Z992 Dependence on renal dialysis: Secondary | ICD-10-CM | POA: Diagnosis not present

## 2020-07-10 ENCOUNTER — Telehealth: Payer: Self-pay | Admitting: *Deleted

## 2020-07-10 NOTE — Telephone Encounter (Signed)
Daughter left a  Message stating the pt would not be able to make it here to her appt today and to call her back. Called the dtr [place] back and se stated the pt had been hospitalized and now in [place] for therapy. Did not see the admission as charts a being merged per advisory message, so looked at other chart and saw the information. Pt was in hospital from 12/19-06/24/2020 after a fracture and c-diff infection.Also, noted that her INR was greater than 10 in the hospital. Dtr states she is still having diarrhea and they are giving her Vancomycin and stopped her Keflex, advised that the vancomycin is fine with warfarin if she has restarted it. She stated someone check her mom's blood but they discontinued her warfarin. Advised that since she is there they monitoring her Warfarin and the diarrhea can cause the INR to increase so they may have it on hold due to the level. Advised that once she gets discharged to let us know and find out her new Warfarin dose as well, since it will probably be adjusted before she gets home and she verbalized understanding. Also, d/c'd appt for today.

## 2020-07-11 DIAGNOSIS — D688 Other specified coagulation defects: Secondary | ICD-10-CM | POA: Diagnosis not present

## 2020-07-11 DIAGNOSIS — E039 Hypothyroidism, unspecified: Secondary | ICD-10-CM | POA: Diagnosis not present

## 2020-07-11 DIAGNOSIS — N2581 Secondary hyperparathyroidism of renal origin: Secondary | ICD-10-CM | POA: Diagnosis not present

## 2020-07-11 DIAGNOSIS — R519 Headache, unspecified: Secondary | ICD-10-CM | POA: Diagnosis not present

## 2020-07-11 DIAGNOSIS — R197 Diarrhea, unspecified: Secondary | ICD-10-CM | POA: Diagnosis not present

## 2020-07-11 DIAGNOSIS — D631 Anemia in chronic kidney disease: Secondary | ICD-10-CM | POA: Diagnosis not present

## 2020-07-11 DIAGNOSIS — N186 End stage renal disease: Secondary | ICD-10-CM | POA: Diagnosis not present

## 2020-07-11 DIAGNOSIS — Z5181 Encounter for therapeutic drug level monitoring: Secondary | ICD-10-CM | POA: Diagnosis not present

## 2020-07-11 DIAGNOSIS — I482 Chronic atrial fibrillation, unspecified: Secondary | ICD-10-CM | POA: Diagnosis not present

## 2020-07-11 DIAGNOSIS — T7840XA Allergy, unspecified, initial encounter: Secondary | ICD-10-CM | POA: Diagnosis not present

## 2020-07-11 DIAGNOSIS — Z992 Dependence on renal dialysis: Secondary | ICD-10-CM | POA: Diagnosis not present

## 2020-07-11 DIAGNOSIS — D509 Iron deficiency anemia, unspecified: Secondary | ICD-10-CM | POA: Diagnosis not present

## 2020-07-14 DIAGNOSIS — D688 Other specified coagulation defects: Secondary | ICD-10-CM | POA: Diagnosis not present

## 2020-07-14 DIAGNOSIS — D631 Anemia in chronic kidney disease: Secondary | ICD-10-CM | POA: Diagnosis not present

## 2020-07-14 DIAGNOSIS — I482 Chronic atrial fibrillation, unspecified: Secondary | ICD-10-CM | POA: Diagnosis not present

## 2020-07-14 DIAGNOSIS — E039 Hypothyroidism, unspecified: Secondary | ICD-10-CM | POA: Diagnosis not present

## 2020-07-14 DIAGNOSIS — Z992 Dependence on renal dialysis: Secondary | ICD-10-CM | POA: Diagnosis not present

## 2020-07-14 DIAGNOSIS — N2581 Secondary hyperparathyroidism of renal origin: Secondary | ICD-10-CM | POA: Diagnosis not present

## 2020-07-14 DIAGNOSIS — R519 Headache, unspecified: Secondary | ICD-10-CM | POA: Diagnosis not present

## 2020-07-14 DIAGNOSIS — N186 End stage renal disease: Secondary | ICD-10-CM | POA: Diagnosis not present

## 2020-07-14 DIAGNOSIS — D509 Iron deficiency anemia, unspecified: Secondary | ICD-10-CM | POA: Diagnosis not present

## 2020-07-14 DIAGNOSIS — Z5181 Encounter for therapeutic drug level monitoring: Secondary | ICD-10-CM | POA: Diagnosis not present

## 2020-07-14 DIAGNOSIS — R197 Diarrhea, unspecified: Secondary | ICD-10-CM | POA: Diagnosis not present

## 2020-07-14 DIAGNOSIS — T7840XA Allergy, unspecified, initial encounter: Secondary | ICD-10-CM | POA: Diagnosis not present

## 2020-07-15 DIAGNOSIS — R300 Dysuria: Secondary | ICD-10-CM | POA: Diagnosis not present

## 2020-07-16 DIAGNOSIS — Z992 Dependence on renal dialysis: Secondary | ICD-10-CM | POA: Diagnosis not present

## 2020-07-16 DIAGNOSIS — N2581 Secondary hyperparathyroidism of renal origin: Secondary | ICD-10-CM | POA: Diagnosis not present

## 2020-07-16 DIAGNOSIS — R519 Headache, unspecified: Secondary | ICD-10-CM | POA: Diagnosis not present

## 2020-07-16 DIAGNOSIS — D631 Anemia in chronic kidney disease: Secondary | ICD-10-CM | POA: Diagnosis not present

## 2020-07-16 DIAGNOSIS — D688 Other specified coagulation defects: Secondary | ICD-10-CM | POA: Diagnosis not present

## 2020-07-16 DIAGNOSIS — E039 Hypothyroidism, unspecified: Secondary | ICD-10-CM | POA: Diagnosis not present

## 2020-07-16 DIAGNOSIS — T7840XA Allergy, unspecified, initial encounter: Secondary | ICD-10-CM | POA: Diagnosis not present

## 2020-07-16 DIAGNOSIS — D509 Iron deficiency anemia, unspecified: Secondary | ICD-10-CM | POA: Diagnosis not present

## 2020-07-16 DIAGNOSIS — I482 Chronic atrial fibrillation, unspecified: Secondary | ICD-10-CM | POA: Diagnosis not present

## 2020-07-16 DIAGNOSIS — Z5181 Encounter for therapeutic drug level monitoring: Secondary | ICD-10-CM | POA: Diagnosis not present

## 2020-07-16 DIAGNOSIS — N186 End stage renal disease: Secondary | ICD-10-CM | POA: Diagnosis not present

## 2020-07-16 DIAGNOSIS — R197 Diarrhea, unspecified: Secondary | ICD-10-CM | POA: Diagnosis not present

## 2020-07-17 ENCOUNTER — Emergency Department (HOSPITAL_COMMUNITY): Payer: Medicare Other

## 2020-07-17 ENCOUNTER — Emergency Department (HOSPITAL_COMMUNITY)
Admission: EM | Admit: 2020-07-17 | Discharge: 2020-07-17 | Disposition: A | Discharge status: Home / Self Care | Payer: Medicare Other | Attending: Emergency Medicine | Admitting: Emergency Medicine

## 2020-07-17 DIAGNOSIS — I5032 Chronic diastolic (congestive) heart failure: Secondary | ICD-10-CM | POA: Insufficient documentation

## 2020-07-17 DIAGNOSIS — G8929 Other chronic pain: Secondary | ICD-10-CM | POA: Insufficient documentation

## 2020-07-17 DIAGNOSIS — Z79899 Other long term (current) drug therapy: Secondary | ICD-10-CM | POA: Diagnosis not present

## 2020-07-17 DIAGNOSIS — R791 Abnormal coagulation profile: Secondary | ICD-10-CM

## 2020-07-17 DIAGNOSIS — I132 Hypertensive heart and chronic kidney disease with heart failure and with stage 5 chronic kidney disease, or end stage renal disease: Secondary | ICD-10-CM | POA: Diagnosis not present

## 2020-07-17 DIAGNOSIS — R0602 Shortness of breath: Secondary | ICD-10-CM | POA: Diagnosis not present

## 2020-07-17 DIAGNOSIS — R310 Gross hematuria: Secondary | ICD-10-CM | POA: Insufficient documentation

## 2020-07-17 DIAGNOSIS — Z96652 Presence of left artificial knee joint: Secondary | ICD-10-CM | POA: Insufficient documentation

## 2020-07-17 DIAGNOSIS — I10 Essential (primary) hypertension: Secondary | ICD-10-CM | POA: Diagnosis not present

## 2020-07-17 DIAGNOSIS — Z7901 Long term (current) use of anticoagulants: Secondary | ICD-10-CM | POA: Insufficient documentation

## 2020-07-17 DIAGNOSIS — M25552 Pain in left hip: Secondary | ICD-10-CM | POA: Diagnosis not present

## 2020-07-17 DIAGNOSIS — K449 Diaphragmatic hernia without obstruction or gangrene: Secondary | ICD-10-CM | POA: Diagnosis not present

## 2020-07-17 DIAGNOSIS — N186 End stage renal disease: Secondary | ICD-10-CM | POA: Insufficient documentation

## 2020-07-17 DIAGNOSIS — M2550 Pain in unspecified joint: Secondary | ICD-10-CM | POA: Diagnosis not present

## 2020-07-17 DIAGNOSIS — M25562 Pain in left knee: Secondary | ICD-10-CM | POA: Diagnosis not present

## 2020-07-17 DIAGNOSIS — I517 Cardiomegaly: Secondary | ICD-10-CM | POA: Diagnosis not present

## 2020-07-17 DIAGNOSIS — M79602 Pain in left arm: Secondary | ICD-10-CM | POA: Insufficient documentation

## 2020-07-17 DIAGNOSIS — I499 Cardiac arrhythmia, unspecified: Secondary | ICD-10-CM | POA: Diagnosis not present

## 2020-07-17 DIAGNOSIS — R0902 Hypoxemia: Secondary | ICD-10-CM | POA: Diagnosis not present

## 2020-07-17 DIAGNOSIS — Z7401 Bed confinement status: Secondary | ICD-10-CM | POA: Diagnosis not present

## 2020-07-17 DIAGNOSIS — I491 Atrial premature depolarization: Secondary | ICD-10-CM | POA: Diagnosis not present

## 2020-07-17 LAB — URINALYSIS, ROUTINE W REFLEX MICROSCOPIC

## 2020-07-17 LAB — CBC WITH DIFFERENTIAL/PLATELET
Abs Immature Granulocytes: 0.06 10*3/uL (ref 0.00–0.07)
Basophils Absolute: 0 10*3/uL (ref 0.0–0.1)
Basophils Relative: 0 %
Eosinophils Absolute: 0.1 10*3/uL (ref 0.0–0.5)
Eosinophils Relative: 1 %
HCT: 27.8 % — ABNORMAL LOW (ref 36.0–46.0)
Hemoglobin: 8.8 g/dL — ABNORMAL LOW (ref 12.0–15.0)
Immature Granulocytes: 1 %
Lymphocytes Relative: 6 %
Lymphs Abs: 0.5 10*3/uL — ABNORMAL LOW (ref 0.7–4.0)
MCH: 31.3 pg (ref 26.0–34.0)
MCHC: 31.7 g/dL (ref 30.0–36.0)
MCV: 98.9 fL (ref 80.0–100.0)
Monocytes Absolute: 0.6 10*3/uL (ref 0.1–1.0)
Monocytes Relative: 7 %
Neutro Abs: 7.3 10*3/uL (ref 1.7–7.7)
Neutrophils Relative %: 85 %
Platelets: 259 10*3/uL (ref 150–400)
RBC: 2.81 MIL/uL — ABNORMAL LOW (ref 3.87–5.11)
RDW: 18.6 % — ABNORMAL HIGH (ref 11.5–15.5)
WBC: 8.6 10*3/uL (ref 4.0–10.5)
nRBC: 0 % (ref 0.0–0.2)

## 2020-07-17 LAB — BASIC METABOLIC PANEL
Anion gap: 10 (ref 5–15)
BUN: 11 mg/dL (ref 8–23)
CO2: 32 mmol/L (ref 22–32)
Calcium: 7.6 mg/dL — ABNORMAL LOW (ref 8.9–10.3)
Chloride: 95 mmol/L — ABNORMAL LOW (ref 98–111)
Creatinine, Ser: 4.04 mg/dL — ABNORMAL HIGH (ref 0.44–1.00)
GFR, Estimated: 11 mL/min — ABNORMAL LOW (ref >60)
Glucose, Bld: 77 mg/dL (ref 70–99)
Potassium: 3.2 mmol/L — ABNORMAL LOW (ref 3.5–5.1)
Sodium: 137 mmol/L (ref 135–145)

## 2020-07-17 LAB — TROPONIN I (HIGH SENSITIVITY)
Troponin I (High Sensitivity): 38 ng/L — ABNORMAL HIGH (ref <18)
Troponin I (High Sensitivity): 36 ng/L — ABNORMAL HIGH (ref <18)

## 2020-07-17 LAB — URINALYSIS, MICROSCOPIC (REFLEX)
RBC / HPF: >50 RBC/hpf (ref 0–5)
WBC, UA: >50 WBC/hpf (ref 0–5)

## 2020-07-17 LAB — PROTIME-INR
INR: 6.3 (ref 0.8–1.2)
Prothrombin Time: 54.2 seconds — ABNORMAL HIGH (ref 11.4–15.2)

## 2020-07-17 MED ORDER — CEPHALEXIN 250 MG PO CAPS
1000.0000 mg | ORAL_CAPSULE | Freq: Once | ORAL | Status: AC
  Administered 2020-07-17: 1000 mg via ORAL
  Filled 2020-07-17: qty 4

## 2020-07-17 MED ORDER — CEPHALEXIN 500 MG PO CAPS: 500.0000 mg | ORAL_CAPSULE | Freq: Four times a day (QID) | ORAL | 0 refills | Status: DC

## 2020-07-17 NOTE — ED Notes (Signed)
Requested Network engineer to call [facility] for transport

## 2020-07-17 NOTE — Discharge Instructions (Signed)
Take your antibiotics as prescribed.  Follow up with your family doc.

## 2020-07-17 NOTE — ED Triage Notes (Signed)
Pt from [facility] for eval of hematuria x 1 week with groin pain. Had urine culture done today without results yet. Refused her morning medications other than last dose of abx for recent cdiff. Room air SpO2 88%, no c/o shob.

## 2020-07-17 NOTE — ED Provider Notes (Signed)
[facility] EMERGENCY DEPARTMENT Provider Note   CSN: [id number] Arrival date & time: 07/17/20  8527     History Chief Complaint  Patient presents with  . Hematuria    [name] is a 75 y.o. female.  75 yo F with a chief complaints of left arm pain. The patient woke up and had pain to her left arm. Tells me it just hurts and has trouble describing it further. Was a constant pain and then resolved spontaneously. Lasted for a few minutes or so. Had resolved by the time of EMS arrival. She had some shortness of breath with it and was concerned that maybe she was having a heart attack or a stroke. She also has been having hematuria for the past week. Had a supratherapeutic INR and has been holding on her Coumadin. Gets dialysis and fistula to the left arm. Denies any issues with the fistula. Denies headache or neck pain. Denies cough congestion or fever. Has a history of a heart attack but did not present with only left arm pain.  The history is provided by the patient.  Illness Severity:  Moderate Onset quality:  Gradual Duration:  2 minutes Timing:  Rare Progression:  Resolved Chronicity:  New Associated symptoms: shortness of breath   Associated symptoms: no chest pain, no congestion, no fever, no headaches, no myalgias, no nausea, no rhinorrhea, no vomiting and no wheezing        Past Medical History:  Diagnosis Date  . Arthritis of left knee   . Cardiomyopathy ([place])    a. h/o LV dysfunction EF 20-25% in 2013 due to sepsis.>> improved to normal   . Chronic diastolic CHF (congestive heart failure) ([place])    10/ 2013 in setting of septic shock  . Complication of anesthesia    use a little anesthesia , per patient MD states she quit breathing (2016); hard to wake up  . ESRD (end stage renal disease) ([place])    dialysis Tues, Thurs, Sat [address], sees dr deterding   . GERD (gastroesophageal reflux disease)   . Glaucoma    both eyes  . H/O hiatal  hernia    a. CT 2017: large gastric hiatal hernia.  Marland Kitchen Headache(784.0)    migraine hx of  . History of blood transfusion 04/13/2015   . History of echocardiogram    a. Echo 6/17: EF 60-65%, normal wall motion, mild MR, atrial septal lipomatous hypertrophy, PASP 34 mmHg, possible trivial free-flowing pericardial effusion along RV free wall // b. Echo 5/17: Mild LVH, EF 55-60%, normal wall motion, grade 1 diastolic dysfunction, trivial MR, severe LAE, mild RAE, PASP 42 mmHg  . History of kidney stones    10/18/2019: per patient "has a couple currently one in each kidney"  . History of nephrostomy 04/11/2015   currently inplace 04/28/2015  removed now  . History of nuclear stress test    a. Myoview 1/14 - Marked ischemia in the basal anterior, mid anterior, apical septal and apical inferior regions, EF 63% >> LHC normal   . Hyperlipidemia   . Hypertension    medication removed from regimen due to low blood pressure   . Iron deficiency    hx  . Myocardial infarction [facility]) 2013   10/18/2019: per patient "in 2013)  . Nephrolithiasis 2002, 2006   bilateral  . Normal coronary arteries 2014   a. LHC in 1/14: normal coronary arteries  . PAF (paroxysmal atrial fibrillation) ([place])    a. 10/  2013  in setting of Septic Shock //  b. recurrent during admit for pneumonia, L effusion >> placed on Amiodarone // Coumadin for anticoagulation  . Pneumonia jan 2018, last tme lungs clear now   dx 10-06-2014 per CXR--  on 10-27-2014 pt states finished antibiotic and denies cough or fever  . Primary localized osteoarthritis of right hip 10/22/2019  . S/P hemodialysis catheter insertion ([place]) 04/11/2015    right anterior chest , only used once   . Sigmoid diverticulosis   . SOB (shortness of breath) on exertion    10/18/2019: per patient "get short of breath with activity sometimes due to heart valve"  . UTI (urinary tract infection) 05/10/2016    Patient Active Problem List   Diagnosis Date Noted  . Primary  localized osteoarthritis of left knee 10/22/2019  . S/P TKR (total knee replacement), left 10/22/2019  . Gastroenteritis due to norovirus   . Anticoagulated on Coumadin   . Hiatal hernia   . Melena   . UGI bleed 08/23/2018  . Hypokalemia 08/23/2018  . Hyperlipidemia 08/23/2018  . GERD (gastroesophageal reflux disease) 08/23/2018  . Hypertension 08/23/2018  . CAP (community acquired pneumonia) 07/02/2016  . Dehydration   . PNA (pneumonia) 05/10/2016  . On amiodarone therapy 11/30/2015  . Pleural effusion on left   . Pleurisy   . Hypoxemia   . HCAP (healthcare-associated pneumonia) 11/21/2015  . S/P thoracentesis   . Chest pain 11/20/2015  . Pleural effusion, left 11/20/2015  . Pain in the chest   . Pericardial effusion   . Pleural effusion   . Encounter for therapeutic drug monitoring 11/13/2015  . Atrial fibrillation with RVR ([place])   . PAF (paroxysmal atrial fibrillation) ([place]) 11/05/2015  . Urinary tract infectious disease 10/31/2015  . Pyelonephritis 09/25/2015  . Sepsis ([place]) 09/25/2015  . Anemia of renal disease 08/14/2015  . Severe protein-calorie malnutrition ([place]) 05/27/2015  . ESRD (end stage renal disease) ([place])   . Normocytic anemia 11/23/2014  . Essential hypertension, benign 08/14/2013  . Glaucoma     Past Surgical History:  Procedure Laterality Date  . AV FISTULA PLACEMENT Left 06/02/2015   Procedure: BRACHIOCEPHALIC ARTERIOVENOUS (AV) FISTULA CREATION ;  Surgeon: Conrad Halesite, MD;  Location: [place];  Service: Vascular;  Laterality: Left;  . BASCILIC VEIN TRANSPOSITION Left 07/27/2015   Procedure: FIRST STAGE BASILIC VEIN TRANSPOSITION LEFT UPPER ARM;  Surgeon: Conrad Klingerstown, MD;  Location: [place];  Service: Vascular;  Laterality: Left;  . BASCILIC VEIN TRANSPOSITION Left 09/2015   second phase  . BASCILIC VEIN TRANSPOSITION Left 10/12/2015   Procedure: SECOND STAGE BASILIC VEIN TRANSPOSITION LEFT ARM;  Surgeon: Conrad Edgewood, MD;  Location: [place];  Service:  Vascular;  Laterality: Left;  . BREAST BIOPSY Left 08/23/07   benign fibrocystic with duct ectasia  . CARDIAC CATHETERIZATION  07-11-2012  dr Irish Lack   Abnormal stress test/   normal coronary arteries/  LVEDP  61mmHg  . CARDIOVASCULAR STRESS TEST  06-26-2012  dr Irish Lack   marked ischemia in the basal anterior, mid anterior, apical inferior regions/  normal LVF, ef 63%  . CATARACT EXTRACTION W/ INTRAOCULAR LENS  IMPLANT, BILATERAL    . COLONOSCOPY WITH PROPOFOL N/A 10/17/2016   Procedure: COLONOSCOPY WITH PROPOFOL;  Surgeon: Garlan Fair, MD;  Location: [facility];  Service: Endoscopy;  Laterality: N/A;  . CYSTOSCOPY W/ URETERAL STENT PLACEMENT  04/04/2012   Procedure: CYSTOSCOPY WITH RETROGRADE PYELOGRAM/URETERAL STENT PLACEMENT;  Surgeon: Ailene Rud, MD;  Location: [facility];  Service: Urology;  Laterality: Left;  . CYSTOSCOPY W/ URETERAL STENT PLACEMENT Bilateral 05/04/2015   Procedure: CYSTOSCOPY WITH BILATERAL RETROGRADE PYELOGRAM/ WITH INTERPRETATION, EXCHANGE OF RIGHT URETERAL STENT REPLACEMENT AND PLACEMENT LEFT URETERAL STENT PLACEMENT EXAMINATION OF VAGINA;  Surgeon: Carolan Clines, MD;  Location: [facility];  Service: Urology;  Laterality: Bilateral;  . CYSTOSCOPY WITH STENT PLACEMENT Right 10/28/2014   Procedure: RIGHT URETERAL STENT PLACEMENT;  Surgeon: Irine Seal, MD;  Location: [facility];  Service: Urology;  Laterality: Right;  . CYSTOSCOPY WITH STENT PLACEMENT Right 02/26/2015   Procedure: CYSTOSCOPY RETROGRADE PYELOGRAM WITH STENT PLACEMENT;  Surgeon: Cleon Gustin, MD;  Location: [facility];  Service: Urology;  Laterality: Right;  . CYSTOSCOPY/RETROGRADE/URETEROSCOPY/STONE EXTRACTION WITH BASKET Right 11/21/2014   Procedure: CYSTOSCOPY/RIGHT RETROGRADE PYELOGRAM/RIGHT URETEROSCOPY/BASKET EXTRACTION/RIGHT PYELOSCOPY/LASER OF STONE/RIGHT DOUBLE J STENT;  Surgeon: Carolan Clines, MD;  Location: [place];  Service: Urology;  Laterality:  Right;  . ESOPHAGOGASTRODUODENOSCOPY (EGD) WITH PROPOFOL Left 08/25/2018   Procedure: ESOPHAGOGASTRODUODENOSCOPY (EGD) WITH PROPOFOL;  Surgeon: Ronald Lobo, MD;  Location: [place];  Service: Endoscopy;  Laterality: Left;  . EXTRACORPOREAL SHOCK WAVE LITHOTRIPSY  05-28-2012  &  10-08-2012  . HOLMIUM LASER APPLICATION Right 07/23/7626   Procedure: HOLMIUM LASER APPLICATION;  Surgeon: Carolan Clines, MD;  Location: [facility];  Service: Urology;  Laterality: Right;  . IR GENERIC HISTORICAL  02/01/2016   IR NEPHROSTOMY EXCHANGE RIGHT 02/01/2016 Greggory Keen, MD [facility]  . IR GENERIC HISTORICAL  02/24/2016   IR PATIENT EVAL TECH 0-60 MINS 02/24/2016 Aletta Edouard, MD [facility]  . KNEE ARTHROSCOPY Left 02-14-2003  . LAPAROSCOPIC CHOLECYSTECTOMY  03-23-2005  . TOTAL ABDOMINAL HYSTERECTOMY W/ BILATERAL SALPINGOOPHORECTOMY  1993   secondary to fibroids  . TOTAL KNEE ARTHROPLASTY Left 10/22/2019  . TOTAL KNEE ARTHROPLASTY Left 10/22/2019   Procedure: TOTAL KNEE ARTHROPLASTY;  Surgeon: Marchia Bond, MD;  Location: [place];  Service: Orthopedics;  Laterality: Left;  . TRANSTHORACIC ECHOCARDIOGRAM  04-09-2012   normal LVF,  ef 60-65%,  mild LAE,  mild TR, trivial MR and PR     OB History    Gravida  1   Para  1   Term  1   Preterm  0   AB  0   Living  1     SAB  0   IAB  0   Ectopic  0   Multiple  0   Live Births  1           Family History  Problem Relation Age of Onset  . Hypertension Mother   . Cancer Mother 75       breast  . Dementia Mother   . Hypertension Brother   . Diabetes Brother   . Heart disease Brother        before age 76  . Cancer Father 99       pancreatic  . Heart failure Paternal Grandmother   . Bladder Cancer Maternal Grandfather     Social History   Tobacco Use  . Smoking status: Never Smoker  . Smokeless tobacco: Never Used  Vaping Use  . Vaping Use: Never used  Substance Use Topics  . Alcohol use: No     Alcohol/week: 0.0 standard drinks  . Drug use: No    Home Medications Prior to Admission medications   Medication Sig Start Date End Date Taking? Authorizing Provider  cephALEXin (KEFLEX) 500 MG capsule Take 1 capsule (500 mg total)  by mouth 4 (four) times daily. 07/17/20  Yes Deno Etienne, DO  amiodarone (PACERONE) 200 MG tablet TAKE ONE TABLET BY MOUTH DAILY Patient taking differently: Take 200 mg by mouth daily. 05/27/19   Jettie Booze, MD  bimatoprost (LUMIGAN) 0.01 % SOLN Place 1 drop into both eyes at bedtime.    [provider]  calcium acetate (PHOSLO) 667 MG capsule Take 1,334 mg by mouth 3 (three) times daily with meals.     [provider]  lidocaine-prilocaine (EMLA) cream Apply 1 application topically 3 (three) times a week.  09/09/19   [provider]  loperamide (IMODIUM A-D) 2 MG tablet Take 4-6 mg by mouth 4 (four) times daily as needed for diarrhea or loose stools.    [provider]  multivitamin (RENA-VIT) TABS tablet Take 1 tablet by mouth at bedtime. 07/27/19   [provider]  ondansetron (ZOFRAN) 4 MG tablet Take 4 mg by mouth every 8 (eight) hours as needed for nausea or vomiting.    [provider]  oxyCODONE (ROXICODONE) 5 MG immediate release tablet Take 1 tablet (5 mg total) by mouth every 4 (four) hours as needed for severe pain. 10/24/19   Ventura Bruns, PA-C  saccharomyces boulardii (FLORASTOR) 250 MG capsule Take 1 capsule (250 mg total) by mouth 2 (two) times daily. 07/08/15   Donne Hazel, MD  sodium chloride (OCEAN) 0.65 % SOLN nasal spray Place 1 spray into both nostrils 3 (three) times daily as needed for congestion.    [provider]  timolol (BETIMOL) 0.5 % ophthalmic solution Place 1 drop into both eyes every morning.     [provider]  warfarin (COUMADIN) 5 MG tablet TAKE 1/2 TO 1 TABLET BY MOUTH DAILY AS DIRECTED BY COUMADIN CLINIC  Hold Coumadin tonight 10/24/19 but resume  normal home dose tomorrow (10/25/19) 10/24/19   Ventura Bruns, PA-C    Allergies    Astemizole, Fluorouracil, Vicodin [hydrocodone-acetaminophen], Chlorhexidine, and Percocet [oxycodone-acetaminophen]  Review of Systems   Review of Systems  Constitutional: Negative for chills and fever.  HENT: Negative for congestion and rhinorrhea.   Eyes: Negative for redness and visual disturbance.  Respiratory: Positive for shortness of breath. Negative for wheezing.   Cardiovascular: Negative for chest pain and palpitations.  Gastrointestinal: Negative for nausea and vomiting.  Genitourinary: Negative for dysuria and urgency.  Musculoskeletal: Positive for arthralgias. Negative for myalgias.  Skin: Negative for pallor and wound.  Neurological: Negative for dizziness and headaches.    Physical Exam Updated Vital Signs BP (!) 173/59   Pulse 79   Temp 97.9 F (36.6 C) (Oral)   Resp 14   Ht 5\' 2"  (1.575 m)   Wt 77.1 kg   LMP 01/19/1992 (Approximate)   SpO2 91%   BMI 31.09 kg/m   Physical Exam Vitals and nursing note reviewed.  Constitutional:      General: She is not in acute distress.    Appearance: She is well-developed and well-nourished. She is not diaphoretic.  HENT:     Head: Normocephalic and atraumatic.  Eyes:     Extraocular Movements: EOM normal.     Pupils: Pupils are equal, round, and reactive to light.  Cardiovascular:     Rate and Rhythm: Normal rate and regular rhythm.     Heart sounds: Murmur (loud 6/6 murmur best heard in the aortic pole) heard.  No friction rub. No gallop.   Pulmonary:     Effort: Pulmonary effort is normal.  Breath sounds: No wheezing or rales.  Abdominal:     General: There is no distension.     Palpations: Abdomen is soft.     Tenderness: There is no abdominal tenderness.  Musculoskeletal:        General: No tenderness or edema.     Cervical back: Normal range of motion and neck supple.     Comments: Left AV fistula with palpable  thrill. Pulse motor and sensation intact distally. No appreciable weakness to the left upper extremity.  Skin:    General: Skin is warm and dry.  Neurological:     Mental Status: She is alert and oriented to person, place, and time.     GCS: GCS eye subscore is 4. GCS verbal subscore is 5. GCS motor subscore is 6.     Cranial Nerves: Cranial nerves are intact.     Sensory: Sensation is intact.     Motor: Motor function is intact.     Coordination: Coordination is intact.     Comments: Patient with chronic left hip and knee pain. Mild weakness on exam compared to right. Otherwise benign neurologic exam.  Psychiatric:        Mood and Affect: Mood and affect normal.        Behavior: Behavior normal.     ED Results / Procedures / Treatments   Labs (all labs ordered are listed, but only abnormal results are displayed) Labs Reviewed  CBC WITH DIFFERENTIAL/PLATELET - Abnormal; Notable for the following components:      Result Value   RBC 2.81 (*)    Hemoglobin 8.8 (*)    HCT 27.8 (*)    RDW 18.6 (*)    Lymphs Abs 0.5 (*)    All other components within normal limits  BASIC METABOLIC PANEL - Abnormal; Notable for the following components:   Potassium 3.2 (*)    Chloride 95 (*)    Creatinine, Ser 4.04 (*)    Calcium 7.6 (*)    GFR, Estimated 11 (*)    All other components within normal limits  PROTIME-INR - Abnormal; Notable for the following components:   Prothrombin Time 54.2 (*)    INR 6.3 (*)    All other components within normal limits  URINALYSIS, ROUTINE W REFLEX MICROSCOPIC - Abnormal; Notable for the following components:   Color, Urine RED (*)    APPearance TURBID (*)    Glucose, UA   (*)    Value: TEST NOT REPORTED DUE TO COLOR INTERFERENCE OF URINE PIGMENT   Hgb urine dipstick   (*)    Value: TEST NOT REPORTED DUE TO COLOR INTERFERENCE OF URINE PIGMENT   Bilirubin Urine   (*)    Value: TEST NOT REPORTED DUE TO COLOR INTERFERENCE OF URINE PIGMENT   Ketones, ur   (*)     Value: TEST NOT REPORTED DUE TO COLOR INTERFERENCE OF URINE PIGMENT   Protein, ur   (*)    Value: TEST NOT REPORTED DUE TO COLOR INTERFERENCE OF URINE PIGMENT   Nitrite   (*)    Value: TEST NOT REPORTED DUE TO COLOR INTERFERENCE OF URINE PIGMENT   Leukocytes,Ua   (*)    Value: TEST NOT REPORTED DUE TO COLOR INTERFERENCE OF URINE PIGMENT   All other components within normal limits  URINALYSIS, MICROSCOPIC (REFLEX) - Abnormal; Notable for the following components:   Bacteria, UA MANY (*)    All other components within normal limits  TROPONIN I (HIGH SENSITIVITY) - Abnormal;  Notable for the following components:   Troponin I (High Sensitivity) 38 (*)    All other components within normal limits  TROPONIN I (HIGH SENSITIVITY) - Abnormal; Notable for the following components:   Troponin I (High Sensitivity) 36 (*)    All other components within normal limits  URINE CULTURE    EKG EKG Interpretation  Date/Time:  Friday July 17 2020 10:21:57 EST Ventricular Rate:  78 PR Interval:    QRS Duration: 124 QT Interval:  482 QTC Calculation: 550 R Axis:   83 Text Interpretation: Sinus rhythm Nonspecific intraventricular conduction delay No significant change since last tracing Confirmed by Deno Etienne [phone]) on 07/17/2020 10:54:43 AM   Radiology DG Chest Port 1 View  Result Date: 07/17/2020 CLINICAL DATA:  Short of breath.  Decreased oxygen saturation. EXAM: PORTABLE CHEST 1 VIEW COMPARISON:  06/07/2020 and older studies. FINDINGS: Mild enlargement of the cardiopericardial silhouette. No mediastinal or hilar masses. Prominent pulmonary arteries. Moderate size hiatal hernia, stable. Bilateral vascular congestion with thickened bilateral interstitial opacities. No lung consolidation. No visualized pleural effusion and no pneumothorax. Skeletal structures are grossly intact. IMPRESSION: 1. Cardiomegaly with vascular congestion and interstitial thickening consistent with congestive heart  failure with interstitial pulmonary edema. Electronically Signed   By: Lajean Manes M.D.   On: 07/17/2020 10:19    Procedures Procedures   Medications Ordered in ED Medications  cephALEXin (KEFLEX) capsule 1,000 mg (has no administration in time range)    ED Course  I have reviewed the triage vital signs and the nursing notes.  Pertinent labs & imaging results that were available during my care of the patient were reviewed by me and considered in my medical decision making (see chart for details).    MDM Rules/Calculators/A&P                          75 yo F currently in a rehab facility recovering from C. difficile. Complaint of acute onset left arm pain. Had some shortness of breath without symptoms now completely resolved. The patient denied any chest pain or pressure. She was worried that she may have had a heart attack or stroke. She has a benign neurologic exam here. Seems less likely to be a TIA based on positive symptoms and short duration.  Unlikely to be MI with isolated arm symptoms but with her having shortness of breath with these will obtain serial troponins. Chest x-ray blood work. She is also complaining of hematuria though I am not sure if we will be able to get a sample with her being on dialysis likely has decreased urine output.  CXR with slight fluid overload.  Looks similar to last, though need to access patients other chart set to merge where she was recently in the hospital for a left hip fx, cdiff and was d/c to SNF.  Troponins are flat.  Patient with a very modest drop in hemoglobin.  Urine with too numerous to count bacteria large whites.  With urinary symptoms will start on antibiotics.  I was concerned because the patient was just treated for C. difficile and so I discussed case with the ED pharmacist who felt Keflex was reasonable first option.  I reviewed her urine culture information and no history of Pseudomonas for at least 5 years.  The patient's INR is  supratherapeutic.  It was 10 this past week.  She has been holding her Coumadin at her facility.  We will have her continue to  hold it.  3:48 PM:  I have discussed the diagnosis/risks/treatment options with the patient and family and believe the pt to be eligible for discharge home to follow-up with PCP. We also discussed returning to the ED immediately if new or worsening sx occur. We discussed the sx which are most concerning (e.g., sudden worsening pain, fever, inability to tolerate by mouth) that necessitate immediate return. Medications administered to the patient during their visit and any new prescriptions provided to the patient are listed below.  Medications given during this visit Medications  cephALEXin (KEFLEX) capsule 1,000 mg (has no administration in time range)     The patient appears reasonably screen and/or stabilized for discharge and I doubt any other medical condition or other [facility] requiring further screening, evaluation, or treatment in the ED at this time prior to discharge.   Final Clinical Impression(s) / ED Diagnoses Final diagnoses:  Gross hematuria  Supratherapeutic INR  Left arm pain    Rx / DC Orders ED Discharge Orders         Ordered    cephALEXin (KEFLEX) 500 MG capsule  4 times daily        07/17/20 [place], [place], DO 07/17/20 1548

## 2020-07-17 NOTE — ED Notes (Signed)
Called [facility] per Raquel Sarna

## 2020-07-17 NOTE — ED Notes (Signed)
Pt discharged back to [facility] via [facility]. Pt VSS at time of departure.

## 2020-07-17 NOTE — ED Notes (Signed)
Critical Result: INR 6.3. Notified Floyd DO.

## 2020-07-17 NOTE — ED Notes (Signed)
Called lab to have urine culture added on to culture I sent down

## 2020-07-18 DIAGNOSIS — I482 Chronic atrial fibrillation, unspecified: Secondary | ICD-10-CM | POA: Diagnosis not present

## 2020-07-18 DIAGNOSIS — N186 End stage renal disease: Secondary | ICD-10-CM | POA: Diagnosis not present

## 2020-07-18 DIAGNOSIS — Z5181 Encounter for therapeutic drug level monitoring: Secondary | ICD-10-CM | POA: Diagnosis not present

## 2020-07-18 DIAGNOSIS — R197 Diarrhea, unspecified: Secondary | ICD-10-CM | POA: Diagnosis not present

## 2020-07-18 DIAGNOSIS — D631 Anemia in chronic kidney disease: Secondary | ICD-10-CM | POA: Diagnosis not present

## 2020-07-18 DIAGNOSIS — D509 Iron deficiency anemia, unspecified: Secondary | ICD-10-CM | POA: Diagnosis not present

## 2020-07-18 DIAGNOSIS — N2581 Secondary hyperparathyroidism of renal origin: Secondary | ICD-10-CM | POA: Diagnosis not present

## 2020-07-18 DIAGNOSIS — Z992 Dependence on renal dialysis: Secondary | ICD-10-CM | POA: Diagnosis not present

## 2020-07-18 DIAGNOSIS — T7840XA Allergy, unspecified, initial encounter: Secondary | ICD-10-CM | POA: Diagnosis not present

## 2020-07-18 DIAGNOSIS — R519 Headache, unspecified: Secondary | ICD-10-CM | POA: Diagnosis not present

## 2020-07-18 DIAGNOSIS — D688 Other specified coagulation defects: Secondary | ICD-10-CM | POA: Diagnosis not present

## 2020-07-18 DIAGNOSIS — E039 Hypothyroidism, unspecified: Secondary | ICD-10-CM | POA: Diagnosis not present

## 2020-07-18 LAB — URINE CULTURE

## 2020-07-20 DIAGNOSIS — E781 Pure hyperglyceridemia: Secondary | ICD-10-CM | POA: Diagnosis not present

## 2020-07-20 DIAGNOSIS — I480 Paroxysmal atrial fibrillation: Secondary | ICD-10-CM | POA: Diagnosis not present

## 2020-07-20 DIAGNOSIS — R791 Abnormal coagulation profile: Secondary | ICD-10-CM | POA: Diagnosis not present

## 2020-07-20 DIAGNOSIS — N186 End stage renal disease: Secondary | ICD-10-CM | POA: Diagnosis not present

## 2020-07-20 DIAGNOSIS — K219 Gastro-esophageal reflux disease without esophagitis: Secondary | ICD-10-CM | POA: Diagnosis not present

## 2020-07-20 DIAGNOSIS — Z992 Dependence on renal dialysis: Secondary | ICD-10-CM | POA: Diagnosis not present

## 2020-07-20 DIAGNOSIS — I129 Hypertensive chronic kidney disease with stage 1 through stage 4 chronic kidney disease, or unspecified chronic kidney disease: Secondary | ICD-10-CM | POA: Diagnosis not present

## 2020-07-21 DIAGNOSIS — I482 Chronic atrial fibrillation, unspecified: Secondary | ICD-10-CM | POA: Diagnosis not present

## 2020-07-21 DIAGNOSIS — N186 End stage renal disease: Secondary | ICD-10-CM | POA: Diagnosis not present

## 2020-07-21 DIAGNOSIS — Z992 Dependence on renal dialysis: Secondary | ICD-10-CM | POA: Diagnosis not present

## 2020-07-21 DIAGNOSIS — E039 Hypothyroidism, unspecified: Secondary | ICD-10-CM | POA: Diagnosis not present

## 2020-07-21 DIAGNOSIS — D631 Anemia in chronic kidney disease: Secondary | ICD-10-CM | POA: Diagnosis not present

## 2020-07-21 DIAGNOSIS — N2581 Secondary hyperparathyroidism of renal origin: Secondary | ICD-10-CM | POA: Diagnosis not present

## 2020-07-21 DIAGNOSIS — D688 Other specified coagulation defects: Secondary | ICD-10-CM | POA: Diagnosis not present

## 2020-07-21 DIAGNOSIS — R519 Headache, unspecified: Secondary | ICD-10-CM | POA: Diagnosis not present

## 2020-07-21 DIAGNOSIS — R197 Diarrhea, unspecified: Secondary | ICD-10-CM | POA: Diagnosis not present

## 2020-07-21 DIAGNOSIS — Z5181 Encounter for therapeutic drug level monitoring: Secondary | ICD-10-CM | POA: Diagnosis not present

## 2020-07-23 DIAGNOSIS — Z5181 Encounter for therapeutic drug level monitoring: Secondary | ICD-10-CM | POA: Diagnosis not present

## 2020-07-23 DIAGNOSIS — N2581 Secondary hyperparathyroidism of renal origin: Secondary | ICD-10-CM | POA: Diagnosis not present

## 2020-07-23 DIAGNOSIS — E039 Hypothyroidism, unspecified: Secondary | ICD-10-CM | POA: Diagnosis not present

## 2020-07-23 DIAGNOSIS — D688 Other specified coagulation defects: Secondary | ICD-10-CM | POA: Diagnosis not present

## 2020-07-23 DIAGNOSIS — R519 Headache, unspecified: Secondary | ICD-10-CM | POA: Diagnosis not present

## 2020-07-23 DIAGNOSIS — D631 Anemia in chronic kidney disease: Secondary | ICD-10-CM | POA: Diagnosis not present

## 2020-07-23 DIAGNOSIS — R197 Diarrhea, unspecified: Secondary | ICD-10-CM | POA: Diagnosis not present

## 2020-07-23 DIAGNOSIS — N186 End stage renal disease: Secondary | ICD-10-CM | POA: Diagnosis not present

## 2020-07-23 DIAGNOSIS — Z992 Dependence on renal dialysis: Secondary | ICD-10-CM | POA: Diagnosis not present

## 2020-07-23 DIAGNOSIS — I482 Chronic atrial fibrillation, unspecified: Secondary | ICD-10-CM | POA: Diagnosis not present

## 2020-07-25 DIAGNOSIS — D631 Anemia in chronic kidney disease: Secondary | ICD-10-CM | POA: Diagnosis not present

## 2020-07-25 DIAGNOSIS — I482 Chronic atrial fibrillation, unspecified: Secondary | ICD-10-CM | POA: Diagnosis not present

## 2020-07-25 DIAGNOSIS — Z5181 Encounter for therapeutic drug level monitoring: Secondary | ICD-10-CM | POA: Diagnosis not present

## 2020-07-25 DIAGNOSIS — Z992 Dependence on renal dialysis: Secondary | ICD-10-CM | POA: Diagnosis not present

## 2020-07-25 DIAGNOSIS — R197 Diarrhea, unspecified: Secondary | ICD-10-CM | POA: Diagnosis not present

## 2020-07-25 DIAGNOSIS — N2581 Secondary hyperparathyroidism of renal origin: Secondary | ICD-10-CM | POA: Diagnosis not present

## 2020-07-25 DIAGNOSIS — N186 End stage renal disease: Secondary | ICD-10-CM | POA: Diagnosis not present

## 2020-07-25 DIAGNOSIS — E039 Hypothyroidism, unspecified: Secondary | ICD-10-CM | POA: Diagnosis not present

## 2020-07-25 DIAGNOSIS — R519 Headache, unspecified: Secondary | ICD-10-CM | POA: Diagnosis not present

## 2020-07-25 DIAGNOSIS — D688 Other specified coagulation defects: Secondary | ICD-10-CM | POA: Diagnosis not present

## 2020-07-28 ENCOUNTER — Encounter: Payer: Self-pay | Admitting: Interventional Cardiology

## 2020-07-28 DIAGNOSIS — Z992 Dependence on renal dialysis: Secondary | ICD-10-CM | POA: Diagnosis not present

## 2020-07-28 DIAGNOSIS — R519 Headache, unspecified: Secondary | ICD-10-CM | POA: Diagnosis not present

## 2020-07-28 DIAGNOSIS — E039 Hypothyroidism, unspecified: Secondary | ICD-10-CM | POA: Diagnosis not present

## 2020-07-28 DIAGNOSIS — N186 End stage renal disease: Secondary | ICD-10-CM | POA: Diagnosis not present

## 2020-07-28 DIAGNOSIS — N2581 Secondary hyperparathyroidism of renal origin: Secondary | ICD-10-CM | POA: Diagnosis not present

## 2020-07-28 DIAGNOSIS — D688 Other specified coagulation defects: Secondary | ICD-10-CM | POA: Diagnosis not present

## 2020-07-28 DIAGNOSIS — D631 Anemia in chronic kidney disease: Secondary | ICD-10-CM | POA: Diagnosis not present

## 2020-07-28 DIAGNOSIS — R197 Diarrhea, unspecified: Secondary | ICD-10-CM | POA: Diagnosis not present

## 2020-07-28 DIAGNOSIS — I482 Chronic atrial fibrillation, unspecified: Secondary | ICD-10-CM | POA: Diagnosis not present

## 2020-07-28 DIAGNOSIS — Z5181 Encounter for therapeutic drug level monitoring: Secondary | ICD-10-CM | POA: Diagnosis not present

## 2020-07-30 DIAGNOSIS — R791 Abnormal coagulation profile: Secondary | ICD-10-CM | POA: Diagnosis not present

## 2020-07-30 DIAGNOSIS — Z992 Dependence on renal dialysis: Secondary | ICD-10-CM | POA: Diagnosis not present

## 2020-07-30 DIAGNOSIS — Z5181 Encounter for therapeutic drug level monitoring: Secondary | ICD-10-CM | POA: Diagnosis not present

## 2020-07-30 DIAGNOSIS — N390 Urinary tract infection, site not specified: Secondary | ICD-10-CM | POA: Diagnosis not present

## 2020-07-30 DIAGNOSIS — E039 Hypothyroidism, unspecified: Secondary | ICD-10-CM | POA: Diagnosis not present

## 2020-07-30 DIAGNOSIS — N2581 Secondary hyperparathyroidism of renal origin: Secondary | ICD-10-CM | POA: Diagnosis not present

## 2020-07-30 DIAGNOSIS — R519 Headache, unspecified: Secondary | ICD-10-CM | POA: Diagnosis not present

## 2020-07-30 DIAGNOSIS — D631 Anemia in chronic kidney disease: Secondary | ICD-10-CM | POA: Diagnosis not present

## 2020-07-30 DIAGNOSIS — A0472 Enterocolitis due to Clostridium difficile, not specified as recurrent: Secondary | ICD-10-CM | POA: Diagnosis not present

## 2020-07-30 DIAGNOSIS — I482 Chronic atrial fibrillation, unspecified: Secondary | ICD-10-CM | POA: Diagnosis not present

## 2020-07-30 DIAGNOSIS — I10 Essential (primary) hypertension: Secondary | ICD-10-CM | POA: Diagnosis not present

## 2020-07-30 DIAGNOSIS — R197 Diarrhea, unspecified: Secondary | ICD-10-CM | POA: Diagnosis not present

## 2020-07-30 DIAGNOSIS — D688 Other specified coagulation defects: Secondary | ICD-10-CM | POA: Diagnosis not present

## 2020-07-30 DIAGNOSIS — N186 End stage renal disease: Secondary | ICD-10-CM | POA: Diagnosis not present

## 2020-07-31 DIAGNOSIS — S72002D Fracture of unspecified part of neck of left femur, subsequent encounter for closed fracture with routine healing: Secondary | ICD-10-CM | POA: Diagnosis not present

## 2020-08-01 DIAGNOSIS — R519 Headache, unspecified: Secondary | ICD-10-CM | POA: Diagnosis not present

## 2020-08-01 DIAGNOSIS — Z5181 Encounter for therapeutic drug level monitoring: Secondary | ICD-10-CM | POA: Diagnosis not present

## 2020-08-01 DIAGNOSIS — Z992 Dependence on renal dialysis: Secondary | ICD-10-CM | POA: Diagnosis not present

## 2020-08-01 DIAGNOSIS — D631 Anemia in chronic kidney disease: Secondary | ICD-10-CM | POA: Diagnosis not present

## 2020-08-01 DIAGNOSIS — N2581 Secondary hyperparathyroidism of renal origin: Secondary | ICD-10-CM | POA: Diagnosis not present

## 2020-08-01 DIAGNOSIS — E039 Hypothyroidism, unspecified: Secondary | ICD-10-CM | POA: Diagnosis not present

## 2020-08-01 DIAGNOSIS — R197 Diarrhea, unspecified: Secondary | ICD-10-CM | POA: Diagnosis not present

## 2020-08-01 DIAGNOSIS — N186 End stage renal disease: Secondary | ICD-10-CM | POA: Diagnosis not present

## 2020-08-01 DIAGNOSIS — I482 Chronic atrial fibrillation, unspecified: Secondary | ICD-10-CM | POA: Diagnosis not present

## 2020-08-01 DIAGNOSIS — D688 Other specified coagulation defects: Secondary | ICD-10-CM | POA: Diagnosis not present

## 2020-08-03 DIAGNOSIS — I5032 Chronic diastolic (congestive) heart failure: Secondary | ICD-10-CM | POA: Diagnosis not present

## 2020-08-03 DIAGNOSIS — D649 Anemia, unspecified: Secondary | ICD-10-CM | POA: Diagnosis not present

## 2020-08-03 DIAGNOSIS — N186 End stage renal disease: Secondary | ICD-10-CM | POA: Diagnosis not present

## 2020-08-03 DIAGNOSIS — I480 Paroxysmal atrial fibrillation: Secondary | ICD-10-CM | POA: Diagnosis not present

## 2020-08-04 DIAGNOSIS — D688 Other specified coagulation defects: Secondary | ICD-10-CM | POA: Diagnosis not present

## 2020-08-04 DIAGNOSIS — R197 Diarrhea, unspecified: Secondary | ICD-10-CM | POA: Diagnosis not present

## 2020-08-04 DIAGNOSIS — Z992 Dependence on renal dialysis: Secondary | ICD-10-CM | POA: Diagnosis not present

## 2020-08-04 DIAGNOSIS — D631 Anemia in chronic kidney disease: Secondary | ICD-10-CM | POA: Diagnosis not present

## 2020-08-04 DIAGNOSIS — N2581 Secondary hyperparathyroidism of renal origin: Secondary | ICD-10-CM | POA: Diagnosis not present

## 2020-08-04 DIAGNOSIS — R519 Headache, unspecified: Secondary | ICD-10-CM | POA: Diagnosis not present

## 2020-08-04 DIAGNOSIS — N186 End stage renal disease: Secondary | ICD-10-CM | POA: Diagnosis not present

## 2020-08-04 DIAGNOSIS — I482 Chronic atrial fibrillation, unspecified: Secondary | ICD-10-CM | POA: Diagnosis not present

## 2020-08-04 DIAGNOSIS — Z5181 Encounter for therapeutic drug level monitoring: Secondary | ICD-10-CM | POA: Diagnosis not present

## 2020-08-04 DIAGNOSIS — E039 Hypothyroidism, unspecified: Secondary | ICD-10-CM | POA: Diagnosis not present

## 2020-08-06 ENCOUNTER — Telehealth: Payer: Self-pay | Admitting: *Deleted

## 2020-08-06 DIAGNOSIS — D688 Other specified coagulation defects: Secondary | ICD-10-CM | POA: Diagnosis not present

## 2020-08-06 DIAGNOSIS — D631 Anemia in chronic kidney disease: Secondary | ICD-10-CM | POA: Diagnosis not present

## 2020-08-06 DIAGNOSIS — Z5181 Encounter for therapeutic drug level monitoring: Secondary | ICD-10-CM | POA: Diagnosis not present

## 2020-08-06 DIAGNOSIS — I482 Chronic atrial fibrillation, unspecified: Secondary | ICD-10-CM | POA: Diagnosis not present

## 2020-08-06 DIAGNOSIS — N2581 Secondary hyperparathyroidism of renal origin: Secondary | ICD-10-CM | POA: Diagnosis not present

## 2020-08-06 DIAGNOSIS — N186 End stage renal disease: Secondary | ICD-10-CM | POA: Diagnosis not present

## 2020-08-06 DIAGNOSIS — I4891 Unspecified atrial fibrillation: Secondary | ICD-10-CM

## 2020-08-06 DIAGNOSIS — R197 Diarrhea, unspecified: Secondary | ICD-10-CM | POA: Diagnosis not present

## 2020-08-06 DIAGNOSIS — E039 Hypothyroidism, unspecified: Secondary | ICD-10-CM | POA: Diagnosis not present

## 2020-08-06 DIAGNOSIS — R519 Headache, unspecified: Secondary | ICD-10-CM | POA: Diagnosis not present

## 2020-08-06 DIAGNOSIS — Z992 Dependence on renal dialysis: Secondary | ICD-10-CM | POA: Diagnosis not present

## 2020-08-06 NOTE — Telephone Encounter (Signed)
Pt's daughter called and stated that pt was in a SNF and was going to be d/c on 2/25 from SNF. Scheduled pt an appointment to have INR checked on 3/2 at the coumadin clinic.

## 2020-08-08 DIAGNOSIS — N186 End stage renal disease: Secondary | ICD-10-CM | POA: Diagnosis not present

## 2020-08-08 DIAGNOSIS — R197 Diarrhea, unspecified: Secondary | ICD-10-CM | POA: Diagnosis not present

## 2020-08-08 DIAGNOSIS — Z5181 Encounter for therapeutic drug level monitoring: Secondary | ICD-10-CM | POA: Diagnosis not present

## 2020-08-08 DIAGNOSIS — Z992 Dependence on renal dialysis: Secondary | ICD-10-CM | POA: Diagnosis not present

## 2020-08-08 DIAGNOSIS — I482 Chronic atrial fibrillation, unspecified: Secondary | ICD-10-CM | POA: Diagnosis not present

## 2020-08-08 DIAGNOSIS — D631 Anemia in chronic kidney disease: Secondary | ICD-10-CM | POA: Diagnosis not present

## 2020-08-08 DIAGNOSIS — D688 Other specified coagulation defects: Secondary | ICD-10-CM | POA: Diagnosis not present

## 2020-08-08 DIAGNOSIS — R519 Headache, unspecified: Secondary | ICD-10-CM | POA: Diagnosis not present

## 2020-08-08 DIAGNOSIS — E039 Hypothyroidism, unspecified: Secondary | ICD-10-CM | POA: Diagnosis not present

## 2020-08-08 DIAGNOSIS — N2581 Secondary hyperparathyroidism of renal origin: Secondary | ICD-10-CM | POA: Diagnosis not present

## 2020-08-11 DIAGNOSIS — N2581 Secondary hyperparathyroidism of renal origin: Secondary | ICD-10-CM | POA: Diagnosis not present

## 2020-08-11 DIAGNOSIS — D688 Other specified coagulation defects: Secondary | ICD-10-CM | POA: Diagnosis not present

## 2020-08-11 DIAGNOSIS — E781 Pure hyperglyceridemia: Secondary | ICD-10-CM | POA: Diagnosis not present

## 2020-08-11 DIAGNOSIS — N186 End stage renal disease: Secondary | ICD-10-CM | POA: Diagnosis not present

## 2020-08-11 DIAGNOSIS — E039 Hypothyroidism, unspecified: Secondary | ICD-10-CM | POA: Diagnosis not present

## 2020-08-11 DIAGNOSIS — Z5181 Encounter for therapeutic drug level monitoring: Secondary | ICD-10-CM | POA: Diagnosis not present

## 2020-08-11 DIAGNOSIS — I480 Paroxysmal atrial fibrillation: Secondary | ICD-10-CM | POA: Diagnosis not present

## 2020-08-11 DIAGNOSIS — I129 Hypertensive chronic kidney disease with stage 1 through stage 4 chronic kidney disease, or unspecified chronic kidney disease: Secondary | ICD-10-CM | POA: Diagnosis not present

## 2020-08-11 DIAGNOSIS — D631 Anemia in chronic kidney disease: Secondary | ICD-10-CM | POA: Diagnosis not present

## 2020-08-11 DIAGNOSIS — Z992 Dependence on renal dialysis: Secondary | ICD-10-CM | POA: Diagnosis not present

## 2020-08-11 DIAGNOSIS — K219 Gastro-esophageal reflux disease without esophagitis: Secondary | ICD-10-CM | POA: Diagnosis not present

## 2020-08-11 DIAGNOSIS — I482 Chronic atrial fibrillation, unspecified: Secondary | ICD-10-CM | POA: Diagnosis not present

## 2020-08-11 DIAGNOSIS — R197 Diarrhea, unspecified: Secondary | ICD-10-CM | POA: Diagnosis not present

## 2020-08-11 DIAGNOSIS — R519 Headache, unspecified: Secondary | ICD-10-CM | POA: Diagnosis not present

## 2020-08-13 DIAGNOSIS — D631 Anemia in chronic kidney disease: Secondary | ICD-10-CM | POA: Diagnosis not present

## 2020-08-13 DIAGNOSIS — R519 Headache, unspecified: Secondary | ICD-10-CM | POA: Diagnosis not present

## 2020-08-13 DIAGNOSIS — N2581 Secondary hyperparathyroidism of renal origin: Secondary | ICD-10-CM | POA: Diagnosis not present

## 2020-08-13 DIAGNOSIS — Z992 Dependence on renal dialysis: Secondary | ICD-10-CM | POA: Diagnosis not present

## 2020-08-13 DIAGNOSIS — E039 Hypothyroidism, unspecified: Secondary | ICD-10-CM | POA: Diagnosis not present

## 2020-08-13 DIAGNOSIS — I482 Chronic atrial fibrillation, unspecified: Secondary | ICD-10-CM | POA: Diagnosis not present

## 2020-08-13 DIAGNOSIS — N186 End stage renal disease: Secondary | ICD-10-CM | POA: Diagnosis not present

## 2020-08-13 DIAGNOSIS — Z5181 Encounter for therapeutic drug level monitoring: Secondary | ICD-10-CM | POA: Diagnosis not present

## 2020-08-13 DIAGNOSIS — R197 Diarrhea, unspecified: Secondary | ICD-10-CM | POA: Diagnosis not present

## 2020-08-13 DIAGNOSIS — D688 Other specified coagulation defects: Secondary | ICD-10-CM | POA: Diagnosis not present

## 2020-08-15 DIAGNOSIS — D631 Anemia in chronic kidney disease: Secondary | ICD-10-CM | POA: Diagnosis not present

## 2020-08-15 DIAGNOSIS — I482 Chronic atrial fibrillation, unspecified: Secondary | ICD-10-CM | POA: Diagnosis not present

## 2020-08-15 DIAGNOSIS — R197 Diarrhea, unspecified: Secondary | ICD-10-CM | POA: Diagnosis not present

## 2020-08-15 DIAGNOSIS — E039 Hypothyroidism, unspecified: Secondary | ICD-10-CM | POA: Diagnosis not present

## 2020-08-15 DIAGNOSIS — D688 Other specified coagulation defects: Secondary | ICD-10-CM | POA: Diagnosis not present

## 2020-08-15 DIAGNOSIS — N186 End stage renal disease: Secondary | ICD-10-CM | POA: Diagnosis not present

## 2020-08-15 DIAGNOSIS — R519 Headache, unspecified: Secondary | ICD-10-CM | POA: Diagnosis not present

## 2020-08-15 DIAGNOSIS — Z5181 Encounter for therapeutic drug level monitoring: Secondary | ICD-10-CM | POA: Diagnosis not present

## 2020-08-15 DIAGNOSIS — Z992 Dependence on renal dialysis: Secondary | ICD-10-CM | POA: Diagnosis not present

## 2020-08-15 DIAGNOSIS — N2581 Secondary hyperparathyroidism of renal origin: Secondary | ICD-10-CM | POA: Diagnosis not present

## 2020-08-17 ENCOUNTER — Telehealth: Payer: Self-pay | Admitting: *Deleted

## 2020-08-17 DIAGNOSIS — Z992 Dependence on renal dialysis: Secondary | ICD-10-CM | POA: Diagnosis not present

## 2020-08-17 DIAGNOSIS — N186 End stage renal disease: Secondary | ICD-10-CM | POA: Diagnosis not present

## 2020-08-17 DIAGNOSIS — I129 Hypertensive chronic kidney disease with stage 1 through stage 4 chronic kidney disease, or unspecified chronic kidney disease: Secondary | ICD-10-CM | POA: Diagnosis not present

## 2020-08-17 NOTE — Telephone Encounter (Signed)
Pt's daughter [name],  called and stated that pt was d/c from a SNF on 2/25 and the physician there stopped her warfarin and started her on Eliquis. Cancelled coumadin appointment for 3/2.    [place], [place]. Wanted clarify what dose of Eliquis pt was put on and to see if they would be able to fax over the most recent labs they drew on the patient.   Also, called pt's daughter and pt, LMOM to see if they would be able to clarify the dose of Eliquis pt was on to update the medlist.

## 2020-08-17 NOTE — Telephone Encounter (Signed)
[name],  Can you comment on the dose of Eliquis.?  THanks.

## 2020-08-17 NOTE — Telephone Encounter (Addendum)
Called and spoke to pt's daughter who stated that pt is taking Eliquis 2.5 mg BID. Pt's daughter stated that pt got blood drawn on 2/22 at [facility], but dose not know what was drawn. Pt's daughter stated she is in the process of getting pt a follow up appointment with Dr. Felipa Eth (pt's PCP).   Pt's last Scr from when she was in the hospital was 4.04 ( 07/17/2020), pt is on dialysis Weight: 77.1 kg Age: 75 yo.

## 2020-08-18 DIAGNOSIS — D688 Other specified coagulation defects: Secondary | ICD-10-CM | POA: Diagnosis not present

## 2020-08-18 DIAGNOSIS — N2581 Secondary hyperparathyroidism of renal origin: Secondary | ICD-10-CM | POA: Diagnosis not present

## 2020-08-18 DIAGNOSIS — Z992 Dependence on renal dialysis: Secondary | ICD-10-CM | POA: Diagnosis not present

## 2020-08-18 DIAGNOSIS — R519 Headache, unspecified: Secondary | ICD-10-CM | POA: Diagnosis not present

## 2020-08-18 DIAGNOSIS — R197 Diarrhea, unspecified: Secondary | ICD-10-CM | POA: Diagnosis not present

## 2020-08-18 DIAGNOSIS — D631 Anemia in chronic kidney disease: Secondary | ICD-10-CM | POA: Diagnosis not present

## 2020-08-18 DIAGNOSIS — N186 End stage renal disease: Secondary | ICD-10-CM | POA: Diagnosis not present

## 2020-08-18 NOTE — Telephone Encounter (Signed)
Per uptodate: "According to the manufacturer, no dosage adjustment necessary unless either ?76 years of age or body weight ?60 kg, then reduce to 2.5 mg twice daily. The manufacturer recommendations are derived from a single-dose pharmacokinetic and pharmacodynamic (anti-Factor Xa activity) evaluation in 8 patients Mina Marble 2016). A multiple-dose pharmacokinetic study demonstrated drug accumulation in ESKD patients requiring hemodialysis, with a dose of 2.5 mg twice daily producing exposures similar to those produced by a 5 mg twice-daily dose in patients with normal kidney function (Mavrakanas 2017). Despite this finding, a retrospective, propensity-matched cohort study of patients with ESKD requiring hemodialysis found that apixaban 5 mg twice daily resulted in fewer thromboembolic events and fewer major bleeding events, while apixaban 2.5 mg twice daily only resulted in fewer major bleeding events compared to warfarin (Siontis 2018)."  Its a challenging question to answer and there does not seem to be a consensus. From retrospective data it appears that 5mg  is more effective at preventing stroke/death and still had fewer bleeding events than warfarin.  Https://www.ahajournals.org/doi/10.1161/CIRCULATIONAHA.340.352481  I think it might be reasonable to increase the dose to 5mg  BID as long as her hematuria (most likely due to supratherapeutic INR) has resolved.

## 2020-08-19 DIAGNOSIS — R310 Gross hematuria: Secondary | ICD-10-CM | POA: Diagnosis not present

## 2020-08-19 DIAGNOSIS — Z9229 Personal history of other drug therapy: Secondary | ICD-10-CM | POA: Diagnosis not present

## 2020-08-19 DIAGNOSIS — I272 Pulmonary hypertension, unspecified: Secondary | ICD-10-CM | POA: Diagnosis not present

## 2020-08-19 NOTE — Telephone Encounter (Signed)
OK. I would hold Eliquis for 3 days and then restart the 2.5 mg BID dose.

## 2020-08-19 NOTE — Telephone Encounter (Signed)
Please see Megan's note below.  Technically, Eliquis 5 mg BID is recommended.  WOuld have to watch closely for bleeding problems.  For age over 91 or weight < 60 kb, Eliquis 2.5 mg BID is recommended.   Data is limited on this subject so I think it can be an individual decision.  I would be ok with trying te 2.5 mg BID for a little while and making sure there are no bleeding issues before trying the 5 mg BID.  JV

## 2020-08-19 NOTE — Telephone Encounter (Signed)
Agree with [name] that 5mg  BID would be better for pt given age < 69 and weight > 60kg.  ACC/AHA guidelines state "The [name] Food and Drug Administration cautiously extended apixaban use to patients with end-stage kidney disease on hemodialysis based on limited pharmacokinetic data. The recommended dose of apixaban was 5 mg twice daily with a reduction in dose to 2.5 mg twice daily for either ?76 years of age or body weight ?60 kg."

## 2020-08-19 NOTE — Telephone Encounter (Signed)
Patient's daughter notified.  She reports patient is having a great deal of bleeding with urination.  Blood noted on poise pads patient wears. Daughter describes as similar to period bleeding.  This has been going on for 10 days.  Patient saw PA at Dr Carlyle Lipa office today.  Cipro was ordered as it was felt UTI never cleared up completely. Daughter reports several lab tests were drawn today also.

## 2020-08-20 DIAGNOSIS — R197 Diarrhea, unspecified: Secondary | ICD-10-CM | POA: Diagnosis not present

## 2020-08-20 DIAGNOSIS — Z992 Dependence on renal dialysis: Secondary | ICD-10-CM | POA: Diagnosis not present

## 2020-08-20 DIAGNOSIS — R519 Headache, unspecified: Secondary | ICD-10-CM | POA: Diagnosis not present

## 2020-08-20 DIAGNOSIS — N2581 Secondary hyperparathyroidism of renal origin: Secondary | ICD-10-CM | POA: Diagnosis not present

## 2020-08-20 DIAGNOSIS — N186 End stage renal disease: Secondary | ICD-10-CM | POA: Diagnosis not present

## 2020-08-20 DIAGNOSIS — D688 Other specified coagulation defects: Secondary | ICD-10-CM | POA: Diagnosis not present

## 2020-08-20 DIAGNOSIS — D631 Anemia in chronic kidney disease: Secondary | ICD-10-CM | POA: Diagnosis not present

## 2020-08-20 NOTE — Telephone Encounter (Signed)
Patient's daughter notified of instructions from Dr Irish Lack

## 2020-08-21 ENCOUNTER — Other Ambulatory Visit: Payer: Self-pay | Admitting: Interventional Cardiology

## 2020-08-21 DIAGNOSIS — I480 Paroxysmal atrial fibrillation: Secondary | ICD-10-CM

## 2020-08-22 DIAGNOSIS — D688 Other specified coagulation defects: Secondary | ICD-10-CM | POA: Diagnosis not present

## 2020-08-22 DIAGNOSIS — N186 End stage renal disease: Secondary | ICD-10-CM | POA: Diagnosis not present

## 2020-08-22 DIAGNOSIS — N2581 Secondary hyperparathyroidism of renal origin: Secondary | ICD-10-CM | POA: Diagnosis not present

## 2020-08-22 DIAGNOSIS — R197 Diarrhea, unspecified: Secondary | ICD-10-CM | POA: Diagnosis not present

## 2020-08-22 DIAGNOSIS — Z992 Dependence on renal dialysis: Secondary | ICD-10-CM | POA: Diagnosis not present

## 2020-08-22 DIAGNOSIS — R519 Headache, unspecified: Secondary | ICD-10-CM | POA: Diagnosis not present

## 2020-08-22 DIAGNOSIS — D631 Anemia in chronic kidney disease: Secondary | ICD-10-CM | POA: Diagnosis not present

## 2020-08-24 DIAGNOSIS — E781 Pure hyperglyceridemia: Secondary | ICD-10-CM | POA: Diagnosis not present

## 2020-08-24 DIAGNOSIS — H409 Unspecified glaucoma: Secondary | ICD-10-CM | POA: Diagnosis not present

## 2020-08-24 DIAGNOSIS — I429 Cardiomyopathy, unspecified: Secondary | ICD-10-CM | POA: Diagnosis not present

## 2020-08-24 DIAGNOSIS — R32 Unspecified urinary incontinence: Secondary | ICD-10-CM | POA: Diagnosis not present

## 2020-08-24 DIAGNOSIS — G43909 Migraine, unspecified, not intractable, without status migrainosus: Secondary | ICD-10-CM | POA: Diagnosis not present

## 2020-08-24 DIAGNOSIS — I2510 Atherosclerotic heart disease of native coronary artery without angina pectoris: Secondary | ICD-10-CM | POA: Diagnosis not present

## 2020-08-24 DIAGNOSIS — S72002D Fracture of unspecified part of neck of left femur, subsequent encounter for closed fracture with routine healing: Secondary | ICD-10-CM | POA: Diagnosis not present

## 2020-08-24 DIAGNOSIS — Z992 Dependence on renal dialysis: Secondary | ICD-10-CM | POA: Diagnosis not present

## 2020-08-24 DIAGNOSIS — N186 End stage renal disease: Secondary | ICD-10-CM | POA: Diagnosis not present

## 2020-08-24 DIAGNOSIS — I480 Paroxysmal atrial fibrillation: Secondary | ICD-10-CM | POA: Diagnosis not present

## 2020-08-24 DIAGNOSIS — E785 Hyperlipidemia, unspecified: Secondary | ICD-10-CM | POA: Diagnosis not present

## 2020-08-24 DIAGNOSIS — I5042 Chronic combined systolic (congestive) and diastolic (congestive) heart failure: Secondary | ICD-10-CM | POA: Diagnosis not present

## 2020-08-24 DIAGNOSIS — I129 Hypertensive chronic kidney disease with stage 1 through stage 4 chronic kidney disease, or unspecified chronic kidney disease: Secondary | ICD-10-CM | POA: Diagnosis not present

## 2020-08-24 DIAGNOSIS — K219 Gastro-esophageal reflux disease without esophagitis: Secondary | ICD-10-CM | POA: Diagnosis not present

## 2020-08-24 DIAGNOSIS — R159 Full incontinence of feces: Secondary | ICD-10-CM | POA: Diagnosis not present

## 2020-08-24 DIAGNOSIS — Z96642 Presence of left artificial hip joint: Secondary | ICD-10-CM | POA: Diagnosis not present

## 2020-08-24 DIAGNOSIS — I350 Nonrheumatic aortic (valve) stenosis: Secondary | ICD-10-CM | POA: Diagnosis not present

## 2020-08-24 DIAGNOSIS — I132 Hypertensive heart and chronic kidney disease with heart failure and with stage 5 chronic kidney disease, or end stage renal disease: Secondary | ICD-10-CM | POA: Diagnosis not present

## 2020-08-25 DIAGNOSIS — I482 Chronic atrial fibrillation, unspecified: Secondary | ICD-10-CM | POA: Diagnosis not present

## 2020-08-25 DIAGNOSIS — E039 Hypothyroidism, unspecified: Secondary | ICD-10-CM | POA: Diagnosis not present

## 2020-08-25 DIAGNOSIS — N2581 Secondary hyperparathyroidism of renal origin: Secondary | ICD-10-CM | POA: Diagnosis not present

## 2020-08-25 DIAGNOSIS — Z992 Dependence on renal dialysis: Secondary | ICD-10-CM | POA: Diagnosis not present

## 2020-08-25 DIAGNOSIS — Z5181 Encounter for therapeutic drug level monitoring: Secondary | ICD-10-CM | POA: Diagnosis not present

## 2020-08-25 DIAGNOSIS — N186 End stage renal disease: Secondary | ICD-10-CM | POA: Diagnosis not present

## 2020-08-25 DIAGNOSIS — D688 Other specified coagulation defects: Secondary | ICD-10-CM | POA: Diagnosis not present

## 2020-08-25 DIAGNOSIS — R519 Headache, unspecified: Secondary | ICD-10-CM | POA: Diagnosis not present

## 2020-08-25 DIAGNOSIS — R197 Diarrhea, unspecified: Secondary | ICD-10-CM | POA: Diagnosis not present

## 2020-08-26 DIAGNOSIS — R310 Gross hematuria: Secondary | ICD-10-CM | POA: Diagnosis not present

## 2020-08-27 DIAGNOSIS — Z5181 Encounter for therapeutic drug level monitoring: Secondary | ICD-10-CM | POA: Diagnosis not present

## 2020-08-27 DIAGNOSIS — R197 Diarrhea, unspecified: Secondary | ICD-10-CM | POA: Diagnosis not present

## 2020-08-27 DIAGNOSIS — I482 Chronic atrial fibrillation, unspecified: Secondary | ICD-10-CM | POA: Diagnosis not present

## 2020-08-27 DIAGNOSIS — N186 End stage renal disease: Secondary | ICD-10-CM | POA: Diagnosis not present

## 2020-08-27 DIAGNOSIS — R519 Headache, unspecified: Secondary | ICD-10-CM | POA: Diagnosis not present

## 2020-08-27 DIAGNOSIS — N2581 Secondary hyperparathyroidism of renal origin: Secondary | ICD-10-CM | POA: Diagnosis not present

## 2020-08-27 DIAGNOSIS — E039 Hypothyroidism, unspecified: Secondary | ICD-10-CM | POA: Diagnosis not present

## 2020-08-27 DIAGNOSIS — D688 Other specified coagulation defects: Secondary | ICD-10-CM | POA: Diagnosis not present

## 2020-08-27 DIAGNOSIS — Z992 Dependence on renal dialysis: Secondary | ICD-10-CM | POA: Diagnosis not present

## 2020-08-28 DIAGNOSIS — S72002D Fracture of unspecified part of neck of left femur, subsequent encounter for closed fracture with routine healing: Secondary | ICD-10-CM | POA: Diagnosis not present

## 2020-08-29 DIAGNOSIS — N2581 Secondary hyperparathyroidism of renal origin: Secondary | ICD-10-CM | POA: Diagnosis not present

## 2020-08-29 DIAGNOSIS — N186 End stage renal disease: Secondary | ICD-10-CM | POA: Diagnosis not present

## 2020-08-29 DIAGNOSIS — E039 Hypothyroidism, unspecified: Secondary | ICD-10-CM | POA: Diagnosis not present

## 2020-08-29 DIAGNOSIS — Z5181 Encounter for therapeutic drug level monitoring: Secondary | ICD-10-CM | POA: Diagnosis not present

## 2020-08-29 DIAGNOSIS — R519 Headache, unspecified: Secondary | ICD-10-CM | POA: Diagnosis not present

## 2020-08-29 DIAGNOSIS — Z992 Dependence on renal dialysis: Secondary | ICD-10-CM | POA: Diagnosis not present

## 2020-08-29 DIAGNOSIS — D688 Other specified coagulation defects: Secondary | ICD-10-CM | POA: Diagnosis not present

## 2020-08-29 DIAGNOSIS — I482 Chronic atrial fibrillation, unspecified: Secondary | ICD-10-CM | POA: Diagnosis not present

## 2020-08-29 DIAGNOSIS — R197 Diarrhea, unspecified: Secondary | ICD-10-CM | POA: Diagnosis not present

## 2020-09-01 DIAGNOSIS — R197 Diarrhea, unspecified: Secondary | ICD-10-CM | POA: Diagnosis not present

## 2020-09-01 DIAGNOSIS — D631 Anemia in chronic kidney disease: Secondary | ICD-10-CM | POA: Diagnosis not present

## 2020-09-01 DIAGNOSIS — R519 Headache, unspecified: Secondary | ICD-10-CM | POA: Diagnosis not present

## 2020-09-01 DIAGNOSIS — Z992 Dependence on renal dialysis: Secondary | ICD-10-CM | POA: Diagnosis not present

## 2020-09-01 DIAGNOSIS — N2581 Secondary hyperparathyroidism of renal origin: Secondary | ICD-10-CM | POA: Diagnosis not present

## 2020-09-01 DIAGNOSIS — D688 Other specified coagulation defects: Secondary | ICD-10-CM | POA: Diagnosis not present

## 2020-09-01 DIAGNOSIS — N186 End stage renal disease: Secondary | ICD-10-CM | POA: Diagnosis not present

## 2020-09-03 DIAGNOSIS — D688 Other specified coagulation defects: Secondary | ICD-10-CM | POA: Diagnosis not present

## 2020-09-03 DIAGNOSIS — R197 Diarrhea, unspecified: Secondary | ICD-10-CM | POA: Diagnosis not present

## 2020-09-03 DIAGNOSIS — R519 Headache, unspecified: Secondary | ICD-10-CM | POA: Diagnosis not present

## 2020-09-03 DIAGNOSIS — Z992 Dependence on renal dialysis: Secondary | ICD-10-CM | POA: Diagnosis not present

## 2020-09-03 DIAGNOSIS — N186 End stage renal disease: Secondary | ICD-10-CM | POA: Diagnosis not present

## 2020-09-03 DIAGNOSIS — D631 Anemia in chronic kidney disease: Secondary | ICD-10-CM | POA: Diagnosis not present

## 2020-09-03 DIAGNOSIS — N2581 Secondary hyperparathyroidism of renal origin: Secondary | ICD-10-CM | POA: Diagnosis not present

## 2020-09-05 DIAGNOSIS — R197 Diarrhea, unspecified: Secondary | ICD-10-CM | POA: Diagnosis not present

## 2020-09-05 DIAGNOSIS — R519 Headache, unspecified: Secondary | ICD-10-CM | POA: Diagnosis not present

## 2020-09-05 DIAGNOSIS — Z992 Dependence on renal dialysis: Secondary | ICD-10-CM | POA: Diagnosis not present

## 2020-09-05 DIAGNOSIS — D631 Anemia in chronic kidney disease: Secondary | ICD-10-CM | POA: Diagnosis not present

## 2020-09-05 DIAGNOSIS — N2581 Secondary hyperparathyroidism of renal origin: Secondary | ICD-10-CM | POA: Diagnosis not present

## 2020-09-05 DIAGNOSIS — N186 End stage renal disease: Secondary | ICD-10-CM | POA: Diagnosis not present

## 2020-09-05 DIAGNOSIS — D688 Other specified coagulation defects: Secondary | ICD-10-CM | POA: Diagnosis not present

## 2020-09-08 DIAGNOSIS — N2581 Secondary hyperparathyroidism of renal origin: Secondary | ICD-10-CM | POA: Diagnosis not present

## 2020-09-08 DIAGNOSIS — D688 Other specified coagulation defects: Secondary | ICD-10-CM | POA: Diagnosis not present

## 2020-09-08 DIAGNOSIS — R197 Diarrhea, unspecified: Secondary | ICD-10-CM | POA: Diagnosis not present

## 2020-09-08 DIAGNOSIS — N186 End stage renal disease: Secondary | ICD-10-CM | POA: Diagnosis not present

## 2020-09-08 DIAGNOSIS — Z992 Dependence on renal dialysis: Secondary | ICD-10-CM | POA: Diagnosis not present

## 2020-09-08 DIAGNOSIS — R519 Headache, unspecified: Secondary | ICD-10-CM | POA: Diagnosis not present

## 2020-09-08 DIAGNOSIS — T7840XA Allergy, unspecified, initial encounter: Secondary | ICD-10-CM | POA: Diagnosis not present

## 2020-09-09 DIAGNOSIS — S72002D Fracture of unspecified part of neck of left femur, subsequent encounter for closed fracture with routine healing: Secondary | ICD-10-CM | POA: Diagnosis not present

## 2020-09-10 DIAGNOSIS — T7840XA Allergy, unspecified, initial encounter: Secondary | ICD-10-CM | POA: Diagnosis not present

## 2020-09-10 DIAGNOSIS — N186 End stage renal disease: Secondary | ICD-10-CM | POA: Diagnosis not present

## 2020-09-10 DIAGNOSIS — Z992 Dependence on renal dialysis: Secondary | ICD-10-CM | POA: Diagnosis not present

## 2020-09-10 DIAGNOSIS — R197 Diarrhea, unspecified: Secondary | ICD-10-CM | POA: Diagnosis not present

## 2020-09-10 DIAGNOSIS — R519 Headache, unspecified: Secondary | ICD-10-CM | POA: Diagnosis not present

## 2020-09-10 DIAGNOSIS — N2581 Secondary hyperparathyroidism of renal origin: Secondary | ICD-10-CM | POA: Diagnosis not present

## 2020-09-10 DIAGNOSIS — D688 Other specified coagulation defects: Secondary | ICD-10-CM | POA: Diagnosis not present

## 2020-09-12 DIAGNOSIS — R519 Headache, unspecified: Secondary | ICD-10-CM | POA: Diagnosis not present

## 2020-09-12 DIAGNOSIS — N186 End stage renal disease: Secondary | ICD-10-CM | POA: Diagnosis not present

## 2020-09-12 DIAGNOSIS — D688 Other specified coagulation defects: Secondary | ICD-10-CM | POA: Diagnosis not present

## 2020-09-12 DIAGNOSIS — R197 Diarrhea, unspecified: Secondary | ICD-10-CM | POA: Diagnosis not present

## 2020-09-12 DIAGNOSIS — N2581 Secondary hyperparathyroidism of renal origin: Secondary | ICD-10-CM | POA: Diagnosis not present

## 2020-09-12 DIAGNOSIS — Z992 Dependence on renal dialysis: Secondary | ICD-10-CM | POA: Diagnosis not present

## 2020-09-12 DIAGNOSIS — T7840XA Allergy, unspecified, initial encounter: Secondary | ICD-10-CM | POA: Diagnosis not present

## 2020-09-14 ENCOUNTER — Other Ambulatory Visit: Payer: Self-pay

## 2020-09-14 MED ORDER — APIXABAN 2.5 MG PO TABS: 2.5000 mg | ORAL_TABLET | Freq: Two times a day (BID) | ORAL | 5 refills | Status: DC

## 2020-09-14 NOTE — Telephone Encounter (Signed)
Returned call to pt's daughter Selinda Eon, pt has been taking Eliquis 2.5mg  BID since 08/14/20 d/c from [place].  Refill to [facility] on [address]. Per telephone note from 08/17/20 in Epic Dr Irish Lack wanted pt's Eliquis dosage to be lowered to 2.5mg  BID secondary to gross hematuria. Will refill rx.

## 2020-09-14 NOTE — Telephone Encounter (Signed)
Pt's daughter [name], calling requesting a refill on Eliquis. Daughter would like a call back concerning this matter, at [phone]. Please address

## 2020-09-15 DIAGNOSIS — N186 End stage renal disease: Secondary | ICD-10-CM | POA: Diagnosis not present

## 2020-09-15 DIAGNOSIS — R197 Diarrhea, unspecified: Secondary | ICD-10-CM | POA: Diagnosis not present

## 2020-09-15 DIAGNOSIS — R519 Headache, unspecified: Secondary | ICD-10-CM | POA: Diagnosis not present

## 2020-09-15 DIAGNOSIS — Z992 Dependence on renal dialysis: Secondary | ICD-10-CM | POA: Diagnosis not present

## 2020-09-15 DIAGNOSIS — N2581 Secondary hyperparathyroidism of renal origin: Secondary | ICD-10-CM | POA: Diagnosis not present

## 2020-09-16 DIAGNOSIS — I129 Hypertensive chronic kidney disease with stage 1 through stage 4 chronic kidney disease, or unspecified chronic kidney disease: Secondary | ICD-10-CM | POA: Diagnosis not present

## 2020-09-16 DIAGNOSIS — N186 End stage renal disease: Secondary | ICD-10-CM | POA: Diagnosis not present

## 2020-09-16 DIAGNOSIS — I5032 Chronic diastolic (congestive) heart failure: Secondary | ICD-10-CM | POA: Diagnosis not present

## 2020-09-16 DIAGNOSIS — I480 Paroxysmal atrial fibrillation: Secondary | ICD-10-CM | POA: Diagnosis not present

## 2020-09-17 DIAGNOSIS — Z992 Dependence on renal dialysis: Secondary | ICD-10-CM | POA: Diagnosis not present

## 2020-09-17 DIAGNOSIS — I129 Hypertensive chronic kidney disease with stage 1 through stage 4 chronic kidney disease, or unspecified chronic kidney disease: Secondary | ICD-10-CM | POA: Diagnosis not present

## 2020-09-17 DIAGNOSIS — R519 Headache, unspecified: Secondary | ICD-10-CM | POA: Diagnosis not present

## 2020-09-17 DIAGNOSIS — N2581 Secondary hyperparathyroidism of renal origin: Secondary | ICD-10-CM | POA: Diagnosis not present

## 2020-09-17 DIAGNOSIS — N186 End stage renal disease: Secondary | ICD-10-CM | POA: Diagnosis not present

## 2020-09-17 DIAGNOSIS — R197 Diarrhea, unspecified: Secondary | ICD-10-CM | POA: Diagnosis not present

## 2020-09-19 DIAGNOSIS — Z992 Dependence on renal dialysis: Secondary | ICD-10-CM | POA: Diagnosis not present

## 2020-09-19 DIAGNOSIS — R197 Diarrhea, unspecified: Secondary | ICD-10-CM | POA: Diagnosis not present

## 2020-09-19 DIAGNOSIS — R519 Headache, unspecified: Secondary | ICD-10-CM | POA: Diagnosis not present

## 2020-09-19 DIAGNOSIS — N2581 Secondary hyperparathyroidism of renal origin: Secondary | ICD-10-CM | POA: Diagnosis not present

## 2020-09-19 DIAGNOSIS — N186 End stage renal disease: Secondary | ICD-10-CM | POA: Diagnosis not present

## 2020-09-23 DIAGNOSIS — R159 Full incontinence of feces: Secondary | ICD-10-CM | POA: Diagnosis not present

## 2020-09-23 DIAGNOSIS — K219 Gastro-esophageal reflux disease without esophagitis: Secondary | ICD-10-CM | POA: Diagnosis not present

## 2020-09-23 DIAGNOSIS — Z992 Dependence on renal dialysis: Secondary | ICD-10-CM | POA: Diagnosis not present

## 2020-09-23 DIAGNOSIS — Z96642 Presence of left artificial hip joint: Secondary | ICD-10-CM | POA: Diagnosis not present

## 2020-09-23 DIAGNOSIS — I5042 Chronic combined systolic (congestive) and diastolic (congestive) heart failure: Secondary | ICD-10-CM | POA: Diagnosis not present

## 2020-09-23 DIAGNOSIS — R32 Unspecified urinary incontinence: Secondary | ICD-10-CM | POA: Diagnosis not present

## 2020-09-23 DIAGNOSIS — S72002D Fracture of unspecified part of neck of left femur, subsequent encounter for closed fracture with routine healing: Secondary | ICD-10-CM | POA: Diagnosis not present

## 2020-09-23 DIAGNOSIS — I132 Hypertensive heart and chronic kidney disease with heart failure and with stage 5 chronic kidney disease, or end stage renal disease: Secondary | ICD-10-CM | POA: Diagnosis not present

## 2020-09-23 DIAGNOSIS — G43909 Migraine, unspecified, not intractable, without status migrainosus: Secondary | ICD-10-CM | POA: Diagnosis not present

## 2020-09-23 DIAGNOSIS — I480 Paroxysmal atrial fibrillation: Secondary | ICD-10-CM | POA: Diagnosis not present

## 2020-09-23 DIAGNOSIS — N186 End stage renal disease: Secondary | ICD-10-CM | POA: Diagnosis not present

## 2020-09-23 DIAGNOSIS — R519 Headache, unspecified: Secondary | ICD-10-CM | POA: Diagnosis not present

## 2020-09-23 DIAGNOSIS — E785 Hyperlipidemia, unspecified: Secondary | ICD-10-CM | POA: Diagnosis not present

## 2020-09-23 DIAGNOSIS — I429 Cardiomyopathy, unspecified: Secondary | ICD-10-CM | POA: Diagnosis not present

## 2020-09-23 DIAGNOSIS — N2581 Secondary hyperparathyroidism of renal origin: Secondary | ICD-10-CM | POA: Diagnosis not present

## 2020-09-23 DIAGNOSIS — R197 Diarrhea, unspecified: Secondary | ICD-10-CM | POA: Diagnosis not present

## 2020-09-23 DIAGNOSIS — I350 Nonrheumatic aortic (valve) stenosis: Secondary | ICD-10-CM | POA: Diagnosis not present

## 2020-09-23 DIAGNOSIS — I2510 Atherosclerotic heart disease of native coronary artery without angina pectoris: Secondary | ICD-10-CM | POA: Diagnosis not present

## 2020-09-23 DIAGNOSIS — H409 Unspecified glaucoma: Secondary | ICD-10-CM | POA: Diagnosis not present

## 2020-09-24 DIAGNOSIS — N186 End stage renal disease: Secondary | ICD-10-CM | POA: Diagnosis not present

## 2020-09-24 DIAGNOSIS — R519 Headache, unspecified: Secondary | ICD-10-CM | POA: Diagnosis not present

## 2020-09-24 DIAGNOSIS — R197 Diarrhea, unspecified: Secondary | ICD-10-CM | POA: Diagnosis not present

## 2020-09-24 DIAGNOSIS — N2581 Secondary hyperparathyroidism of renal origin: Secondary | ICD-10-CM | POA: Diagnosis not present

## 2020-09-24 DIAGNOSIS — Z992 Dependence on renal dialysis: Secondary | ICD-10-CM | POA: Diagnosis not present

## 2020-09-26 DIAGNOSIS — N186 End stage renal disease: Secondary | ICD-10-CM | POA: Diagnosis not present

## 2020-09-26 DIAGNOSIS — N2581 Secondary hyperparathyroidism of renal origin: Secondary | ICD-10-CM | POA: Diagnosis not present

## 2020-09-26 DIAGNOSIS — Z992 Dependence on renal dialysis: Secondary | ICD-10-CM | POA: Diagnosis not present

## 2020-09-26 DIAGNOSIS — R197 Diarrhea, unspecified: Secondary | ICD-10-CM | POA: Diagnosis not present

## 2020-09-26 DIAGNOSIS — R519 Headache, unspecified: Secondary | ICD-10-CM | POA: Diagnosis not present

## 2020-09-28 ENCOUNTER — Ambulatory Visit (HOSPITAL_COMMUNITY): Payer: Medicare Other | Attending: Cardiology

## 2020-09-28 ENCOUNTER — Other Ambulatory Visit: Payer: Self-pay

## 2020-09-28 DIAGNOSIS — I350 Nonrheumatic aortic (valve) stenosis: Secondary | ICD-10-CM | POA: Diagnosis not present

## 2020-09-28 DIAGNOSIS — S72002D Fracture of unspecified part of neck of left femur, subsequent encounter for closed fracture with routine healing: Secondary | ICD-10-CM | POA: Diagnosis not present

## 2020-09-28 LAB — ECHOCARDIOGRAM COMPLETE
AR max vel: 0.99 cm2
AV Area VTI: 0.92 cm2
AV Area mean vel: 1.01 cm2
AV Mean grad: 56 mmHg
AV Peak grad: 92.9 mmHg
Ao pk vel: 4.82 m/s
Area-P 1/2: 3.5 cm2
MV M vel: 4.21 m/s
MV Peak grad: 70.9 mmHg
P 1/2 time: 448 msec
Radius: 0.8 cm
S' Lateral: 3.3 cm

## 2020-09-29 DIAGNOSIS — N186 End stage renal disease: Secondary | ICD-10-CM | POA: Diagnosis not present

## 2020-09-29 DIAGNOSIS — N2581 Secondary hyperparathyroidism of renal origin: Secondary | ICD-10-CM | POA: Diagnosis not present

## 2020-09-29 DIAGNOSIS — Z992 Dependence on renal dialysis: Secondary | ICD-10-CM | POA: Diagnosis not present

## 2020-09-29 DIAGNOSIS — Z5181 Encounter for therapeutic drug level monitoring: Secondary | ICD-10-CM | POA: Diagnosis not present

## 2020-09-29 DIAGNOSIS — R519 Headache, unspecified: Secondary | ICD-10-CM | POA: Diagnosis not present

## 2020-09-29 DIAGNOSIS — E039 Hypothyroidism, unspecified: Secondary | ICD-10-CM | POA: Diagnosis not present

## 2020-09-29 DIAGNOSIS — R197 Diarrhea, unspecified: Secondary | ICD-10-CM | POA: Diagnosis not present

## 2020-10-01 ENCOUNTER — Telehealth: Payer: Self-pay | Admitting: Interventional Cardiology

## 2020-10-01 DIAGNOSIS — N2581 Secondary hyperparathyroidism of renal origin: Secondary | ICD-10-CM | POA: Diagnosis not present

## 2020-10-01 DIAGNOSIS — Z992 Dependence on renal dialysis: Secondary | ICD-10-CM | POA: Diagnosis not present

## 2020-10-01 DIAGNOSIS — E039 Hypothyroidism, unspecified: Secondary | ICD-10-CM | POA: Diagnosis not present

## 2020-10-01 DIAGNOSIS — Z5181 Encounter for therapeutic drug level monitoring: Secondary | ICD-10-CM | POA: Diagnosis not present

## 2020-10-01 DIAGNOSIS — R519 Headache, unspecified: Secondary | ICD-10-CM | POA: Diagnosis not present

## 2020-10-01 DIAGNOSIS — N186 End stage renal disease: Secondary | ICD-10-CM | POA: Diagnosis not present

## 2020-10-01 DIAGNOSIS — R197 Diarrhea, unspecified: Secondary | ICD-10-CM | POA: Diagnosis not present

## 2020-10-01 NOTE — Telephone Encounter (Signed)
I spoke with patient's daughter.  She is concerned about swelling in patient's legs.  Dry weight at dialysis just decreased. Shortness of the breath the same as it has been.  Legs have been swollen for awhile but seem to be worse in last 2-3 weeks. Normal for left leg to be more swollen.  Daughter is requesting June appointment be moved to an earlier date.  Appointment rescheduled for May 11,2022 at 11:40.  I advised daughter to have patient elevate her legs as much as possible.

## 2020-10-01 NOTE — Telephone Encounter (Signed)
Pt c/o swelling: STAT is pt has developed SOB within 24 hours  1) How much weight have you gained and in what time span? No 2) If swelling, where is the swelling located? Legs mostly the left  3) Are you currently taking a fluid pill? No   4) Are you currently SOB? No  5) Do you have a log of your daily weights (if so, list)? No but pt was 176 on thursday  6) Have you gained 3 pounds in a day or 5 pounds in a week?  unknown 7) Have you traveled recently? No  PT's daughter is calling in with concerns of her mother's legs swelling, she is currently going to dialysis and they want to to schedule a visit to discuss this matter.Please advise

## 2020-10-03 DIAGNOSIS — N186 End stage renal disease: Secondary | ICD-10-CM | POA: Diagnosis not present

## 2020-10-03 DIAGNOSIS — R519 Headache, unspecified: Secondary | ICD-10-CM | POA: Diagnosis not present

## 2020-10-03 DIAGNOSIS — E039 Hypothyroidism, unspecified: Secondary | ICD-10-CM | POA: Diagnosis not present

## 2020-10-03 DIAGNOSIS — R197 Diarrhea, unspecified: Secondary | ICD-10-CM | POA: Diagnosis not present

## 2020-10-03 DIAGNOSIS — Z992 Dependence on renal dialysis: Secondary | ICD-10-CM | POA: Diagnosis not present

## 2020-10-03 DIAGNOSIS — N2581 Secondary hyperparathyroidism of renal origin: Secondary | ICD-10-CM | POA: Diagnosis not present

## 2020-10-03 DIAGNOSIS — Z5181 Encounter for therapeutic drug level monitoring: Secondary | ICD-10-CM | POA: Diagnosis not present

## 2020-10-06 DIAGNOSIS — R519 Headache, unspecified: Secondary | ICD-10-CM | POA: Diagnosis not present

## 2020-10-06 DIAGNOSIS — Z992 Dependence on renal dialysis: Secondary | ICD-10-CM | POA: Diagnosis not present

## 2020-10-06 DIAGNOSIS — N2581 Secondary hyperparathyroidism of renal origin: Secondary | ICD-10-CM | POA: Diagnosis not present

## 2020-10-06 DIAGNOSIS — R197 Diarrhea, unspecified: Secondary | ICD-10-CM | POA: Diagnosis not present

## 2020-10-06 DIAGNOSIS — N186 End stage renal disease: Secondary | ICD-10-CM | POA: Diagnosis not present

## 2020-10-08 DIAGNOSIS — N186 End stage renal disease: Secondary | ICD-10-CM | POA: Diagnosis not present

## 2020-10-08 DIAGNOSIS — Z992 Dependence on renal dialysis: Secondary | ICD-10-CM | POA: Diagnosis not present

## 2020-10-08 DIAGNOSIS — R519 Headache, unspecified: Secondary | ICD-10-CM | POA: Diagnosis not present

## 2020-10-08 DIAGNOSIS — N2581 Secondary hyperparathyroidism of renal origin: Secondary | ICD-10-CM | POA: Diagnosis not present

## 2020-10-08 DIAGNOSIS — R197 Diarrhea, unspecified: Secondary | ICD-10-CM | POA: Diagnosis not present

## 2020-10-10 DIAGNOSIS — R197 Diarrhea, unspecified: Secondary | ICD-10-CM | POA: Diagnosis not present

## 2020-10-10 DIAGNOSIS — N2581 Secondary hyperparathyroidism of renal origin: Secondary | ICD-10-CM | POA: Diagnosis not present

## 2020-10-10 DIAGNOSIS — Z992 Dependence on renal dialysis: Secondary | ICD-10-CM | POA: Diagnosis not present

## 2020-10-10 DIAGNOSIS — N186 End stage renal disease: Secondary | ICD-10-CM | POA: Diagnosis not present

## 2020-10-10 DIAGNOSIS — R519 Headache, unspecified: Secondary | ICD-10-CM | POA: Diagnosis not present

## 2020-10-13 DIAGNOSIS — N2581 Secondary hyperparathyroidism of renal origin: Secondary | ICD-10-CM | POA: Diagnosis not present

## 2020-10-13 DIAGNOSIS — Z992 Dependence on renal dialysis: Secondary | ICD-10-CM | POA: Diagnosis not present

## 2020-10-13 DIAGNOSIS — E875 Hyperkalemia: Secondary | ICD-10-CM | POA: Diagnosis not present

## 2020-10-13 DIAGNOSIS — R519 Headache, unspecified: Secondary | ICD-10-CM | POA: Diagnosis not present

## 2020-10-13 DIAGNOSIS — N186 End stage renal disease: Secondary | ICD-10-CM | POA: Diagnosis not present

## 2020-10-13 DIAGNOSIS — R197 Diarrhea, unspecified: Secondary | ICD-10-CM | POA: Diagnosis not present

## 2020-10-15 DIAGNOSIS — E875 Hyperkalemia: Secondary | ICD-10-CM | POA: Diagnosis not present

## 2020-10-15 DIAGNOSIS — R519 Headache, unspecified: Secondary | ICD-10-CM | POA: Diagnosis not present

## 2020-10-15 DIAGNOSIS — R197 Diarrhea, unspecified: Secondary | ICD-10-CM | POA: Diagnosis not present

## 2020-10-15 DIAGNOSIS — N186 End stage renal disease: Secondary | ICD-10-CM | POA: Diagnosis not present

## 2020-10-15 DIAGNOSIS — Z992 Dependence on renal dialysis: Secondary | ICD-10-CM | POA: Diagnosis not present

## 2020-10-15 DIAGNOSIS — N2581 Secondary hyperparathyroidism of renal origin: Secondary | ICD-10-CM | POA: Diagnosis not present

## 2020-10-17 DIAGNOSIS — R519 Headache, unspecified: Secondary | ICD-10-CM | POA: Diagnosis not present

## 2020-10-17 DIAGNOSIS — I129 Hypertensive chronic kidney disease with stage 1 through stage 4 chronic kidney disease, or unspecified chronic kidney disease: Secondary | ICD-10-CM | POA: Diagnosis not present

## 2020-10-17 DIAGNOSIS — N186 End stage renal disease: Secondary | ICD-10-CM | POA: Diagnosis not present

## 2020-10-17 DIAGNOSIS — N2581 Secondary hyperparathyroidism of renal origin: Secondary | ICD-10-CM | POA: Diagnosis not present

## 2020-10-17 DIAGNOSIS — R197 Diarrhea, unspecified: Secondary | ICD-10-CM | POA: Diagnosis not present

## 2020-10-17 DIAGNOSIS — Z992 Dependence on renal dialysis: Secondary | ICD-10-CM | POA: Diagnosis not present

## 2020-10-17 DIAGNOSIS — E875 Hyperkalemia: Secondary | ICD-10-CM | POA: Diagnosis not present

## 2020-10-18 DIAGNOSIS — I129 Hypertensive chronic kidney disease with stage 1 through stage 4 chronic kidney disease, or unspecified chronic kidney disease: Secondary | ICD-10-CM | POA: Diagnosis not present

## 2020-10-18 DIAGNOSIS — Z992 Dependence on renal dialysis: Secondary | ICD-10-CM | POA: Diagnosis not present

## 2020-10-18 DIAGNOSIS — N186 End stage renal disease: Secondary | ICD-10-CM | POA: Diagnosis not present

## 2020-10-20 DIAGNOSIS — D688 Other specified coagulation defects: Secondary | ICD-10-CM | POA: Diagnosis not present

## 2020-10-20 DIAGNOSIS — R519 Headache, unspecified: Secondary | ICD-10-CM | POA: Diagnosis not present

## 2020-10-20 DIAGNOSIS — D631 Anemia in chronic kidney disease: Secondary | ICD-10-CM | POA: Diagnosis not present

## 2020-10-20 DIAGNOSIS — E875 Hyperkalemia: Secondary | ICD-10-CM | POA: Diagnosis not present

## 2020-10-20 DIAGNOSIS — N186 End stage renal disease: Secondary | ICD-10-CM | POA: Diagnosis not present

## 2020-10-20 DIAGNOSIS — Z992 Dependence on renal dialysis: Secondary | ICD-10-CM | POA: Diagnosis not present

## 2020-10-20 DIAGNOSIS — N2581 Secondary hyperparathyroidism of renal origin: Secondary | ICD-10-CM | POA: Diagnosis not present

## 2020-10-20 DIAGNOSIS — R197 Diarrhea, unspecified: Secondary | ICD-10-CM | POA: Diagnosis not present

## 2020-10-22 ENCOUNTER — Encounter (HOSPITAL_COMMUNITY): Payer: Self-pay

## 2020-10-22 ENCOUNTER — Other Ambulatory Visit: Payer: Self-pay

## 2020-10-22 ENCOUNTER — Emergency Department (HOSPITAL_COMMUNITY)
Admission: EM | Admit: 2020-10-22 | Discharge: 2020-10-23 | Disposition: A | Discharge status: Home / Self Care | Payer: Medicare Other | Attending: Emergency Medicine | Admitting: Emergency Medicine

## 2020-10-22 DIAGNOSIS — Z96642 Presence of left artificial hip joint: Secondary | ICD-10-CM | POA: Insufficient documentation

## 2020-10-22 DIAGNOSIS — E875 Hyperkalemia: Secondary | ICD-10-CM | POA: Diagnosis not present

## 2020-10-22 DIAGNOSIS — I129 Hypertensive chronic kidney disease with stage 1 through stage 4 chronic kidney disease, or unspecified chronic kidney disease: Secondary | ICD-10-CM | POA: Diagnosis not present

## 2020-10-22 DIAGNOSIS — R197 Diarrhea, unspecified: Secondary | ICD-10-CM | POA: Diagnosis not present

## 2020-10-22 DIAGNOSIS — Z79899 Other long term (current) drug therapy: Secondary | ICD-10-CM | POA: Diagnosis not present

## 2020-10-22 DIAGNOSIS — Z955 Presence of coronary angioplasty implant and graft: Secondary | ICD-10-CM | POA: Diagnosis not present

## 2020-10-22 DIAGNOSIS — D688 Other specified coagulation defects: Secondary | ICD-10-CM | POA: Diagnosis not present

## 2020-10-22 DIAGNOSIS — E781 Pure hyperglyceridemia: Secondary | ICD-10-CM | POA: Diagnosis not present

## 2020-10-22 DIAGNOSIS — T829XXA Unspecified complication of cardiac and vascular prosthetic device, implant and graft, initial encounter: Secondary | ICD-10-CM

## 2020-10-22 DIAGNOSIS — Z992 Dependence on renal dialysis: Secondary | ICD-10-CM | POA: Insufficient documentation

## 2020-10-22 DIAGNOSIS — S59912A Unspecified injury of left forearm, initial encounter: Secondary | ICD-10-CM | POA: Diagnosis present

## 2020-10-22 DIAGNOSIS — X58XXXA Exposure to other specified factors, initial encounter: Secondary | ICD-10-CM | POA: Diagnosis not present

## 2020-10-22 DIAGNOSIS — T8249XA Other complication of vascular dialysis catheter, initial encounter: Secondary | ICD-10-CM | POA: Insufficient documentation

## 2020-10-22 DIAGNOSIS — K219 Gastro-esophageal reflux disease without esophagitis: Secondary | ICD-10-CM | POA: Diagnosis not present

## 2020-10-22 DIAGNOSIS — I132 Hypertensive heart and chronic kidney disease with heart failure and with stage 5 chronic kidney disease, or end stage renal disease: Secondary | ICD-10-CM | POA: Diagnosis not present

## 2020-10-22 DIAGNOSIS — I480 Paroxysmal atrial fibrillation: Secondary | ICD-10-CM | POA: Diagnosis not present

## 2020-10-22 DIAGNOSIS — Z7901 Long term (current) use of anticoagulants: Secondary | ICD-10-CM | POA: Diagnosis not present

## 2020-10-22 DIAGNOSIS — I5032 Chronic diastolic (congestive) heart failure: Secondary | ICD-10-CM | POA: Insufficient documentation

## 2020-10-22 DIAGNOSIS — N2581 Secondary hyperparathyroidism of renal origin: Secondary | ICD-10-CM | POA: Diagnosis not present

## 2020-10-22 DIAGNOSIS — N186 End stage renal disease: Secondary | ICD-10-CM | POA: Insufficient documentation

## 2020-10-22 DIAGNOSIS — Z96652 Presence of left artificial knee joint: Secondary | ICD-10-CM | POA: Insufficient documentation

## 2020-10-22 DIAGNOSIS — T82898A Other specified complication of vascular prosthetic devices, implants and grafts, initial encounter: Secondary | ICD-10-CM | POA: Diagnosis not present

## 2020-10-22 DIAGNOSIS — R519 Headache, unspecified: Secondary | ICD-10-CM | POA: Diagnosis not present

## 2020-10-22 DIAGNOSIS — S5012XA Contusion of left forearm, initial encounter: Secondary | ICD-10-CM | POA: Insufficient documentation

## 2020-10-22 DIAGNOSIS — R58 Hemorrhage, not elsewhere classified: Secondary | ICD-10-CM | POA: Diagnosis not present

## 2020-10-22 DIAGNOSIS — T82838A Hemorrhage of vascular prosthetic devices, implants and grafts, initial encounter: Secondary | ICD-10-CM | POA: Diagnosis not present

## 2020-10-22 DIAGNOSIS — D631 Anemia in chronic kidney disease: Secondary | ICD-10-CM | POA: Diagnosis not present

## 2020-10-22 NOTE — ED Triage Notes (Signed)
Pt bib [facility] from home. Pt was at dialysis yesterday and fistula infiltrated in left arm. Pt had vas cath placed in right chest today at approximately 1000 and then received dialysis through port. Bleeding around site was minimal at that time. Pt was transferring from wheelchair to bed with assistance today at approximately 1530 when bleeding increased and has continued. Pt has gone through 3-4 washcloths attempting to hold pressure on area. Pt c/o pain in left arm and at vas cath site. Denies weakness or dizziness. Pt does take eliquis.

## 2020-10-22 NOTE — ED Provider Notes (Addendum)
[facility] EMERGENCY DEPARTMENT Provider Note   CSN: [id number] Arrival date & time: 10/22/20  2229     History Chief Complaint  Patient presents with  . Vascular Access Problem    [name] is a 75 y.o. female.  HPI 75 year old female with history of A. fib on Eliquis and ESRD presents the emergency department for bleeding around Vas-Cath site.  This been present since 3 PM this afternoon, constant and improving.  Pressure is made it better, nothing makes it worse.  Denies history of issue like this previously.  Has not taken anything for the symptoms.  Was able to get full dialysis off of her Vas-Cath.  A Vas-Cath was placed, because her left-sided fistula had infiltrated and is not usable at this time. States that the left arm is about the same as it has been, no new concerns.    Past Medical History:  Diagnosis Date  . Arthritis of left knee   . Cardiomyopathy ([place])    a. h/o LV dysfunction EF 20-25% in 2013 due to sepsis.>> improved to normal   . Chronic diastolic CHF (congestive heart failure) ([place])    10/ 2013 in setting of septic shock  . Complication of anesthesia    use a little anesthesia , per patient MD states she quit breathing (2016); hard to wake up  . ESRD (end stage renal disease) ([place])    dialysis Tues, Thurs, Sat [address], sees dr deterding   . GERD (gastroesophageal reflux disease)   . Glaucoma    both eyes  . H/O hiatal hernia    a. CT 2017: large gastric hiatal hernia.  Marland Kitchen Headache(784.0)    migraine hx of  . History of blood transfusion 04/13/2015   . History of echocardiogram    a. Echo 6/17: EF 60-65%, normal wall motion, mild MR, atrial septal lipomatous hypertrophy, PASP 34 mmHg, possible trivial free-flowing pericardial effusion along RV free wall // b. Echo 5/17: Mild LVH, EF 55-60%, normal wall motion, grade 1 diastolic dysfunction, trivial MR, severe LAE, mild RAE, PASP 42 mmHg  . History of kidney stones     10/18/2019: per patient "has a couple currently one in each kidney"  . History of nephrostomy 04/11/2015   currently inplace 04/28/2015  removed now  . History of nuclear stress test    a. Myoview 1/14 - Marked ischemia in the basal anterior, mid anterior, apical septal and apical inferior regions, EF 63% >> LHC normal   . Hyperlipidemia   . Hypertension    medication removed from regimen due to low blood pressure   . Iron deficiency    hx  . Myocardial infarction [facility]) 2013   10/18/2019: per patient "in 2013)  . Nephrolithiasis 2002, 2006   bilateral  . Normal coronary arteries 2014   a. LHC in 1/14: normal coronary arteries  . PAF (paroxysmal atrial fibrillation) ([place])    a. 10/ 2013  in setting of Septic Shock //  b. recurrent during admit for pneumonia, L effusion >> placed on Amiodarone // Coumadin for anticoagulation  . Pneumonia jan 2018, last tme lungs clear now   dx 10-06-2014 per CXR--  on 10-27-2014 pt states finished antibiotic and denies cough or fever  . Primary localized osteoarthritis of right hip 10/22/2019  . S/P hemodialysis catheter insertion ([place]) 04/11/2015    right anterior chest , only used once   . Sigmoid diverticulosis   . SOB (shortness of breath) on exertion  10/18/2019: per patient "get short of breath with activity sometimes due to heart valve"  . UTI (urinary tract infection) 05/10/2016    Patient Active Problem List   Diagnosis Date Noted  . Hip fracture ([place]) 06/07/2020  . Leukocytosis 06/07/2020  . Acute hypoxemic respiratory failure ([place]) 06/07/2020  . Atrial fibrillation ([place]) 06/07/2020  . ESRD (end stage renal disease) ([place]) 06/07/2020  . Primary localized osteoarthritis of left knee 10/22/2019  . S/P TKR (total knee replacement), left 10/22/2019  . Gastroenteritis due to norovirus   . Anticoagulated on Coumadin   . Hiatal hernia   . Melena   . UGI bleed 08/23/2018  . Hypokalemia 08/23/2018  . Hyperlipidemia 08/23/2018  . GERD  (gastroesophageal reflux disease) 08/23/2018  . Hypertension 08/23/2018  . CAP (community acquired pneumonia) 07/02/2016  . Dehydration   . PNA (pneumonia) 05/10/2016  . On amiodarone therapy 11/30/2015  . Pleural effusion on left   . Pleurisy   . Hypoxemia   . HCAP (healthcare-associated pneumonia) 11/21/2015  . S/P thoracentesis   . Chest pain 11/20/2015  . Pleural effusion, left 11/20/2015  . Pain in the chest   . Pericardial effusion   . Pleural effusion   . Encounter for therapeutic drug monitoring 11/13/2015  . Atrial fibrillation with RVR ([place])   . PAF (paroxysmal atrial fibrillation) ([place]) 11/05/2015  . Urinary tract infectious disease 10/31/2015  . Pyelonephritis 09/25/2015  . Sepsis ([place]) 09/25/2015  . Anemia of renal disease 08/14/2015  . Severe protein-calorie malnutrition ([place]) 05/27/2015  . ESRD (end stage renal disease) ([place])   . Normocytic anemia 11/23/2014  . Essential hypertension, benign 08/14/2013  . Glaucoma     Past Surgical History:  Procedure Laterality Date  . AV FISTULA PLACEMENT Left 06/02/2015   Procedure: BRACHIOCEPHALIC ARTERIOVENOUS (AV) FISTULA CREATION ;  Surgeon: Conrad McKinney, MD;  Location: [place];  Service: Vascular;  Laterality: Left;  . BASCILIC VEIN TRANSPOSITION Left 07/27/2015   Procedure: FIRST STAGE BASILIC VEIN TRANSPOSITION LEFT UPPER ARM;  Surgeon: Conrad P[place]arville, MD;  Location: [place];  Service: Vascular;  Laterality: Left;  . BASCILIC VEIN TRANSPOSITION Left 09/2015   second phase  . BASCILIC VEIN TRANSPOSITION Left 10/12/2015   Procedure: SECOND STAGE BASILIC VEIN TRANSPOSITION LEFT ARM;  Surgeon: Conrad [place], MD;  Location: [place];  Service: Vascular;  Laterality: Left;  . BREAST BIOPSY Left 08/23/07   benign fibrocystic with duct ectasia  . CARDIAC CATHETERIZATION  07-11-2012  dr Irish Lack   Abnormal stress test/   normal coronary arteries/  LVEDP  65mmHg  . CARDIOVASCULAR STRESS TEST  06-26-2012  dr Irish Lack   marked ischemia in  the basal anterior, mid anterior, apical inferior regions/  normal LVF, ef 63%  . CATARACT EXTRACTION W/ INTRAOCULAR LENS  IMPLANT, BILATERAL    . COLONOSCOPY WITH PROPOFOL N/A 10/17/2016   Procedure: COLONOSCOPY WITH PROPOFOL;  Surgeon: Garlan Fair, MD;  Location: [facility];  Service: Endoscopy;  Laterality: N/A;  . CYSTOSCOPY W/ URETERAL STENT PLACEMENT  04/04/2012   Procedure: CYSTOSCOPY WITH RETROGRADE PYELOGRAM/URETERAL STENT PLACEMENT;  Surgeon: Ailene Rud, MD;  Location: [place];  Service: Urology;  Laterality: Left;  . CYSTOSCOPY W/ URETERAL STENT PLACEMENT Bilateral 05/04/2015   Procedure: CYSTOSCOPY WITH BILATERAL RETROGRADE PYELOGRAM/ WITH INTERPRETATION, EXCHANGE OF RIGHT URETERAL STENT REPLACEMENT AND PLACEMENT LEFT URETERAL STENT PLACEMENT EXAMINATION OF VAGINA;  Surgeon: Carolan Clines, MD;  Location: [facility];  Service: Urology;  Laterality: Bilateral;  . [place]  Right 10/28/2014   Procedure: RIGHT URETERAL STENT PLACEMENT;  Surgeon: Irine Seal, MD;  Location: [facility];  Service: Urology;  Laterality: Right;  . CYSTOSCOPY WITH STENT PLACEMENT Right 02/26/2015   Procedure: CYSTOSCOPY RETROGRADE PYELOGRAM WITH STENT PLACEMENT;  Surgeon: Cleon Gustin, MD;  Location: [facility];  Service: Urology;  Laterality: Right;  . CYSTOSCOPY/RETROGRADE/URETEROSCOPY/STONE EXTRACTION WITH BASKET Right 11/21/2014   Procedure: CYSTOSCOPY/RIGHT RETROGRADE PYELOGRAM/RIGHT URETEROSCOPY/BASKET EXTRACTION/RIGHT PYELOSCOPY/LASER OF STONE/RIGHT DOUBLE J STENT;  Surgeon: Carolan Clines, MD;  Location: [place];  Service: Urology;  Laterality: Right;  . ESOPHAGOGASTRODUODENOSCOPY (EGD) WITH PROPOFOL Left 08/25/2018   Procedure: ESOPHAGOGASTRODUODENOSCOPY (EGD) WITH PROPOFOL;  Surgeon: Ronald Lobo, MD;  Location: [place];  Service: Endoscopy;  Laterality: Left;  . EXTRACORPOREAL SHOCK WAVE LITHOTRIPSY  05-28-2012  &  10-08-2012   . HIP ARTHROPLASTY Left 06/09/2020   Procedure: ARTHROPLASTY BIPOLAR HIP (HEMIARTHROPLASTY);  Surgeon: Marchia Bond, MD;  Location: [place];  Service: Orthopedics;  Laterality: Left;  . HOLMIUM LASER APPLICATION Right 07/28/7865   Procedure: HOLMIUM LASER APPLICATION;  Surgeon: Carolan Clines, MD;  Location: [facility];  Service: Urology;  Laterality: Right;  . IR GENERIC HISTORICAL  02/01/2016   IR NEPHROSTOMY EXCHANGE RIGHT 02/01/2016 Greggory Keen, MD [facility]  . IR GENERIC HISTORICAL  02/24/2016   IR PATIENT EVAL TECH 0-60 MINS 02/24/2016 Aletta Edouard, MD [facility]  . KNEE ARTHROSCOPY Left 02-14-2003  . LAPAROSCOPIC CHOLECYSTECTOMY  03-23-2005  . TOTAL ABDOMINAL HYSTERECTOMY W/ BILATERAL SALPINGOOPHORECTOMY  1993   secondary to fibroids  . TOTAL KNEE ARTHROPLASTY Left 10/22/2019  . TOTAL KNEE ARTHROPLASTY Left 10/22/2019   Procedure: TOTAL KNEE ARTHROPLASTY;  Surgeon: Marchia Bond, MD;  Location: [place];  Service: Orthopedics;  Laterality: Left;  . TRANSTHORACIC ECHOCARDIOGRAM  04-09-2012   normal LVF,  ef 60-65%,  mild LAE,  mild TR, trivial MR and PR     OB History    Gravida  1   Para  1   Term  1   Preterm  0   AB  0   Living  1     SAB  0   IAB  0   Ectopic  0   Multiple      Live Births  1           Family History  Problem Relation Age of Onset  . Hypertension Mother   . Cancer Mother 71       breast  . Dementia Mother   . Hypertension Brother   . Diabetes Brother   . Heart disease Brother        before age 10  . Cancer Father 73       pancreatic  . Heart failure Paternal Grandmother   . Bladder Cancer Maternal Grandfather     Social History   Tobacco Use  . Smoking status: Never Smoker  . Smokeless tobacco: Never Used  Vaping Use  . Vaping Use: Never used  Substance Use Topics  . Alcohol use: No    Alcohol/week: 0.0 standard drinks  . Drug use: No    Home Medications Prior to Admission medications    Medication Sig Start Date End Date Taking? Authorizing Provider  acetaminophen (TYLENOL) 325 MG tablet Take 2 tablets (650 mg total) by mouth every 6 (six) hours as needed for mild pain, fever or headache. 06/24/20   Alma Friendly, MD  amiodarone (PACERONE) 200 MG tablet Take 1 tablet (200 mg total) by mouth  daily. 08/24/20   Jettie Booze, MD  apixaban (ELIQUIS) 2.5 MG TABS tablet Take 1 tablet (2.5 mg total) by mouth 2 (two) times daily. 09/14/20   Jettie Booze, MD  bimatoprost (LUMIGAN) 0.01 % SOLN Place 1 drop into both eyes at bedtime.    [provider]  bimatoprost (LUMIGAN) 0.01 % SOLN Place 1 drop into both eyes at bedtime.    [provider]  calcium acetate (PHOSLO) 667 MG capsule Take 1,334 mg by mouth 3 (three) times daily with meals.     [provider]  calcium acetate (PHOSLO) 667 MG capsule Take 1 capsule (667 mg total) by mouth 3 (three) times daily with meals. 06/24/20   Alma Friendly, MD  cephALEXin (KEFLEX) 250 MG capsule Take 250 mg by mouth daily. 05/24/20   [provider]  cephALEXin (KEFLEX) 500 MG capsule Take 1 capsule (500 mg total) by mouth 4 (four) times daily. 07/17/20   Deno Etienne, DO  diphenhydrAMINE (BENADRYL) 25 MG tablet Take 1 tablet (25 mg total) by mouth every 8 (eight) hours as needed. 06/24/20   Alma Friendly, MD  lidocaine-prilocaine (EMLA) cream Apply 1 application topically 3 (three) times a week.  09/09/19   [provider]  lidocaine-prilocaine (EMLA) cream Apply 1 application topically 3 (three) times a week. 05/23/20   [provider]  loperamide (IMODIUM A-D) 2 MG tablet Take 4-6 mg by mouth 4 (four) times daily as needed for diarrhea or loose stools.    [provider]  loperamide (IMODIUM A-D) 2 MG tablet Take 2 mg by mouth 4 (four) times daily as needed for diarrhea or loose stools.    [provider]  multivitamin (RENA-VIT) TABS tablet Take 1 tablet by  mouth at bedtime. 07/27/19   [provider]  multivitamin (RENA-VIT) TABS tablet Take 1 tablet by mouth daily. 05/11/20   [provider]  Nutritional Supplements (FEEDING SUPPLEMENT, NEPRO CARB STEADY,) LIQD Take 237 mLs by mouth 2 (two) times daily between meals. 06/24/20   Alma Friendly, MD  ondansetron (ZOFRAN) 4 MG tablet Take 4 mg by mouth every 8 (eight) hours as needed for nausea or vomiting.    [provider]  oxyCODONE (ROXICODONE) 5 MG immediate release tablet Take 1 tablet (5 mg total) by mouth every 4 (four) hours as needed for severe pain. 10/24/19   Ventura Bruns, PA-C  saccharomyces boulardii (FLORASTOR) 250 MG capsule Take 1 capsule (250 mg total) by mouth 2 (two) times daily. 07/08/15   Donne Hazel, MD  saccharomyces boulardii (FLORASTOR) 250 MG capsule Take 250 mg by mouth daily.    [provider]  sodium chloride (OCEAN) 0.65 % SOLN nasal spray Place 1 spray into both nostrils 3 (three) times daily as needed for congestion.    [provider]  timolol (BETIMOL) 0.5 % ophthalmic solution Place 1 drop into both eyes every morning.     [provider]  timolol (BETIMOL) 0.5 % ophthalmic solution Place 1 drop into both eyes daily.    [provider]    Allergies    Astemizole, Fluorouracil, Percocet [oxycodone-acetaminophen], Vicodin [hydrocodone-acetaminophen], Vicodin [hydrocodone-acetaminophen], Chlorhexidine, Chlorhexidine, and Percocet [oxycodone-acetaminophen]  Review of Systems   Review of Systems  Constitutional: Negative for chills and fever.  HENT: Negative for ear pain and sore throat.   Eyes: Negative for pain and visual disturbance.  Respiratory: Negative for cough and shortness of breath.   Cardiovascular: Negative for chest  pain and palpitations.  Gastrointestinal: Negative for abdominal pain and vomiting.  Genitourinary: Negative for dysuria and hematuria.  Musculoskeletal: Negative for  arthralgias and back pain.  Skin: Positive for wound. Negative for color change and rash.  Neurological: Negative for seizures and syncope.  All other systems reviewed and are negative.   Physical Exam Updated Vital Signs BP (!) 127/51   Pulse 71   Temp (!) 96.4 F (35.8 C) (Temporal)   Resp 15   Ht 5\' 3"  (1.6 m)   Wt 77.6 kg   LMP 01/19/1992 (Approximate)   SpO2 94%   BMI 30.29 kg/m   Physical Exam Vitals and nursing note reviewed.  Constitutional:      General: She is not in acute distress.    Appearance: She is well-developed.  HENT:     Head: Normocephalic and atraumatic.     Right Ear: External ear normal.     Left Ear: External ear normal.     Mouth/Throat:     Mouth: Mucous membranes are moist.  Eyes:     Conjunctiva/sclera: Conjunctivae normal.  Cardiovascular:     Rate and Rhythm: Normal rate and regular rhythm.     Heart sounds: No murmur heard.   Pulmonary:     Effort: Pulmonary effort is normal. No respiratory distress.     Breath sounds: Normal breath sounds.  Abdominal:     Palpations: Abdomen is soft.     Tenderness: There is no abdominal tenderness.  Musculoskeletal:     Cervical back: Neck supple.     Comments: Ecchymosis to left arm with ecchymosis, compartments still soft.  Pulses present.  Vas-Cath the right upper chest wall with minimal amount of oozing around site.  Skin:    General: Skin is warm and dry.  Neurological:     General: No focal deficit present.     Mental Status: She is alert and oriented to person, place, and time.  Psychiatric:        Mood and Affect: Mood normal.        Behavior: Behavior normal.     ED Results / Procedures / Treatments   Labs (all labs ordered are listed, but only abnormal results are displayed) Labs Reviewed  CBC WITH DIFFERENTIAL/PLATELET    EKG None  Radiology No results found.  Procedures Procedures   Medications Ordered in ED Medications - No data to display  ED Course  I have  reviewed the triage vital signs and the nursing notes.  Pertinent labs & imaging results that were available during my care of the patient were reviewed by me and considered in my medical decision making (see chart for details).    MDM Rules/Calculators/A&P                          Upon evaluation, vital signs are stable.  Patient is not in acute distress.  Does not tachycardic or show other signs of hemodynamic compromise.  Exam shows the bleeding has significantly stopped.  No redness or warmth concerning for infection.  Does not have large pocket that appears to need suturing.  Plan is to apply quick clot to site and allow patient to follow-up in the morning.  Will obtain CBC to ensure patient not significantly anemic.  Patient is not currently experiencing hemodynamically compromising clot, no need for reversal at this time.  Patient and daughter agreeable to plan.  Quick clot applied with resolution in bleeding.  Plan is  for very close follow-up with provider who placed a Vas-Cath.  CBC shows stable hemoglobin. Patient stable for discharge. She feels comfortable going. We discussed symptomatic management and return precautions. Recommended very close follow-up.    Final Clinical Impression(s) / ED Diagnoses Final diagnoses:  Bleeding  Complication of vascular access for dialysis, initial encounter    Rx / DC Orders ED Discharge Orders    None           Suzan Nailer, DO 10/23/20 0015    Tegeler, Gwenyth Allegra, MD 10/23/20 2057

## 2020-10-23 LAB — CBC WITH DIFFERENTIAL/PLATELET
Abs Immature Granulocytes: 0.02 10*3/uL (ref 0.00–0.07)
Basophils Absolute: 0.1 10*3/uL (ref 0.0–0.1)
Basophils Relative: 1 %
Eosinophils Absolute: 0.1 10*3/uL (ref 0.0–0.5)
Eosinophils Relative: 1 %
HCT: 26 % — ABNORMAL LOW (ref 36.0–46.0)
Hemoglobin: 8.4 g/dL — ABNORMAL LOW (ref 12.0–15.0)
Immature Granulocytes: 0 %
Lymphocytes Relative: 7 %
Lymphs Abs: 0.5 10*3/uL — ABNORMAL LOW (ref 0.7–4.0)
MCH: 29.2 pg (ref 26.0–34.0)
MCHC: 32.3 g/dL (ref 30.0–36.0)
MCV: 90.3 fL (ref 80.0–100.0)
Monocytes Absolute: 0.6 10*3/uL (ref 0.1–1.0)
Monocytes Relative: 8 %
Neutro Abs: 6.4 10*3/uL (ref 1.7–7.7)
Neutrophils Relative %: 83 %
Platelets: 177 10*3/uL (ref 150–400)
RBC: 2.88 MIL/uL — ABNORMAL LOW (ref 3.87–5.11)
RDW: 18.6 % — ABNORMAL HIGH (ref 11.5–15.5)
WBC: 7.7 10*3/uL (ref 4.0–10.5)
nRBC: 0 % (ref 0.0–0.2)

## 2020-10-23 NOTE — Discharge Instructions (Signed)
Follow up with the doctor that placed the vas cath tomorrow.  Come back to the ED if the bleeding continues to worsen, you have chest pain, shortness of breath, or anything concerning a medical emergency. Follow up with your normal doctor.

## 2020-10-23 NOTE — ED Notes (Signed)
No bleeding noted on new dressing. Pt denies any complaints at this time.

## 2020-10-24 DIAGNOSIS — R197 Diarrhea, unspecified: Secondary | ICD-10-CM | POA: Diagnosis not present

## 2020-10-24 DIAGNOSIS — N186 End stage renal disease: Secondary | ICD-10-CM | POA: Diagnosis not present

## 2020-10-24 DIAGNOSIS — N2581 Secondary hyperparathyroidism of renal origin: Secondary | ICD-10-CM | POA: Diagnosis not present

## 2020-10-24 DIAGNOSIS — Z992 Dependence on renal dialysis: Secondary | ICD-10-CM | POA: Diagnosis not present

## 2020-10-24 DIAGNOSIS — D688 Other specified coagulation defects: Secondary | ICD-10-CM | POA: Diagnosis not present

## 2020-10-24 DIAGNOSIS — E875 Hyperkalemia: Secondary | ICD-10-CM | POA: Diagnosis not present

## 2020-10-24 DIAGNOSIS — D631 Anemia in chronic kidney disease: Secondary | ICD-10-CM | POA: Diagnosis not present

## 2020-10-24 DIAGNOSIS — R519 Headache, unspecified: Secondary | ICD-10-CM | POA: Diagnosis not present

## 2020-10-26 ENCOUNTER — Inpatient Hospital Stay (HOSPITAL_COMMUNITY)
Admission: EM | Admit: 2020-10-26 | Discharge: 2020-11-03 | DRG: 252 | Disposition: A | Discharge status: Rehabilitation Facility | Payer: Medicare Other | Attending: Internal Medicine | Admitting: Internal Medicine

## 2020-10-26 ENCOUNTER — Other Ambulatory Visit: Payer: Self-pay

## 2020-10-26 ENCOUNTER — Encounter (HOSPITAL_COMMUNITY): Payer: Self-pay

## 2020-10-26 DIAGNOSIS — N186 End stage renal disease: Secondary | ICD-10-CM | POA: Diagnosis present

## 2020-10-26 DIAGNOSIS — I10 Essential (primary) hypertension: Secondary | ICD-10-CM | POA: Diagnosis not present

## 2020-10-26 DIAGNOSIS — N2581 Secondary hyperparathyroidism of renal origin: Secondary | ICD-10-CM | POA: Diagnosis not present

## 2020-10-26 DIAGNOSIS — I120 Hypertensive chronic kidney disease with stage 5 chronic kidney disease or end stage renal disease: Secondary | ICD-10-CM | POA: Diagnosis not present

## 2020-10-26 DIAGNOSIS — I132 Hypertensive heart and chronic kidney disease with heart failure and with stage 5 chronic kidney disease, or end stage renal disease: Secondary | ICD-10-CM | POA: Diagnosis not present

## 2020-10-26 DIAGNOSIS — Z888 Allergy status to other drugs, medicaments and biological substances status: Secondary | ICD-10-CM

## 2020-10-26 DIAGNOSIS — T79A12A Traumatic compartment syndrome of left upper extremity, initial encounter: Secondary | ICD-10-CM | POA: Diagnosis not present

## 2020-10-26 DIAGNOSIS — K219 Gastro-esophageal reflux disease without esophagitis: Secondary | ICD-10-CM | POA: Diagnosis not present

## 2020-10-26 DIAGNOSIS — R203 Hyperesthesia: Secondary | ICD-10-CM | POA: Diagnosis present

## 2020-10-26 DIAGNOSIS — I480 Paroxysmal atrial fibrillation: Secondary | ICD-10-CM | POA: Diagnosis present

## 2020-10-26 DIAGNOSIS — I350 Nonrheumatic aortic (valve) stenosis: Secondary | ICD-10-CM | POA: Diagnosis present

## 2020-10-26 DIAGNOSIS — G7281 Critical illness myopathy: Secondary | ICD-10-CM | POA: Diagnosis present

## 2020-10-26 DIAGNOSIS — Z992 Dependence on renal dialysis: Secondary | ICD-10-CM | POA: Diagnosis not present

## 2020-10-26 DIAGNOSIS — Z7901 Long term (current) use of anticoagulants: Secondary | ICD-10-CM | POA: Diagnosis not present

## 2020-10-26 DIAGNOSIS — K641 Second degree hemorrhoids: Secondary | ICD-10-CM | POA: Diagnosis not present

## 2020-10-26 DIAGNOSIS — S40022A Contusion of left upper arm, initial encounter: Secondary | ICD-10-CM | POA: Diagnosis present

## 2020-10-26 DIAGNOSIS — Z9841 Cataract extraction status, right eye: Secondary | ICD-10-CM

## 2020-10-26 DIAGNOSIS — F320 Major depressive disorder, single episode, mild: Secondary | ICD-10-CM | POA: Diagnosis not present

## 2020-10-26 DIAGNOSIS — I721 Aneurysm of artery of upper extremity: Secondary | ICD-10-CM | POA: Diagnosis present

## 2020-10-26 DIAGNOSIS — Z961 Presence of intraocular lens: Secondary | ICD-10-CM | POA: Diagnosis not present

## 2020-10-26 DIAGNOSIS — D62 Acute posthemorrhagic anemia: Secondary | ICD-10-CM | POA: Diagnosis not present

## 2020-10-26 DIAGNOSIS — Z8249 Family history of ischemic heart disease and other diseases of the circulatory system: Secondary | ICD-10-CM

## 2020-10-26 DIAGNOSIS — T79A0XA Compartment syndrome, unspecified, initial encounter: Secondary | ICD-10-CM | POA: Diagnosis present

## 2020-10-26 DIAGNOSIS — I5042 Chronic combined systolic (congestive) and diastolic (congestive) heart failure: Secondary | ICD-10-CM | POA: Diagnosis present

## 2020-10-26 DIAGNOSIS — Z885 Allergy status to narcotic agent status: Secondary | ICD-10-CM

## 2020-10-26 DIAGNOSIS — T82510A Breakdown (mechanical) of surgically created arteriovenous fistula, initial encounter: Principal | ICD-10-CM | POA: Diagnosis present

## 2020-10-26 DIAGNOSIS — R195 Other fecal abnormalities: Secondary | ICD-10-CM | POA: Diagnosis not present

## 2020-10-26 DIAGNOSIS — R5381 Other malaise: Secondary | ICD-10-CM | POA: Diagnosis not present

## 2020-10-26 DIAGNOSIS — Z96652 Presence of left artificial knee joint: Secondary | ICD-10-CM | POA: Diagnosis not present

## 2020-10-26 DIAGNOSIS — R200 Anesthesia of skin: Secondary | ICD-10-CM | POA: Diagnosis not present

## 2020-10-26 DIAGNOSIS — E8889 Other specified metabolic disorders: Secondary | ICD-10-CM | POA: Diagnosis not present

## 2020-10-26 DIAGNOSIS — Z20822 Contact with and (suspected) exposure to covid-19: Secondary | ICD-10-CM | POA: Diagnosis present

## 2020-10-26 DIAGNOSIS — D631 Anemia in chronic kidney disease: Secondary | ICD-10-CM | POA: Diagnosis present

## 2020-10-26 DIAGNOSIS — N390 Urinary tract infection, site not specified: Secondary | ICD-10-CM | POA: Diagnosis not present

## 2020-10-26 DIAGNOSIS — M79602 Pain in left arm: Secondary | ICD-10-CM

## 2020-10-26 DIAGNOSIS — D649 Anemia, unspecified: Secondary | ICD-10-CM | POA: Diagnosis not present

## 2020-10-26 DIAGNOSIS — Z90710 Acquired absence of both cervix and uterus: Secondary | ICD-10-CM

## 2020-10-26 DIAGNOSIS — R0989 Other specified symptoms and signs involving the circulatory and respiratory systems: Secondary | ICD-10-CM | POA: Diagnosis not present

## 2020-10-26 DIAGNOSIS — Y712 Prosthetic and other implants, materials and accessory cardiovascular devices associated with adverse incidents: Secondary | ICD-10-CM | POA: Diagnosis present

## 2020-10-26 DIAGNOSIS — I429 Cardiomyopathy, unspecified: Secondary | ICD-10-CM | POA: Diagnosis not present

## 2020-10-26 DIAGNOSIS — Z9842 Cataract extraction status, left eye: Secondary | ICD-10-CM

## 2020-10-26 DIAGNOSIS — D72829 Elevated white blood cell count, unspecified: Secondary | ICD-10-CM | POA: Diagnosis not present

## 2020-10-26 DIAGNOSIS — H409 Unspecified glaucoma: Secondary | ICD-10-CM | POA: Diagnosis not present

## 2020-10-26 DIAGNOSIS — Z87442 Personal history of urinary calculi: Secondary | ICD-10-CM

## 2020-10-26 DIAGNOSIS — G8918 Other acute postprocedural pain: Secondary | ICD-10-CM | POA: Diagnosis not present

## 2020-10-26 DIAGNOSIS — M1712 Unilateral primary osteoarthritis, left knee: Secondary | ICD-10-CM | POA: Diagnosis not present

## 2020-10-26 DIAGNOSIS — Z79899 Other long term (current) drug therapy: Secondary | ICD-10-CM

## 2020-10-26 DIAGNOSIS — E785 Hyperlipidemia, unspecified: Secondary | ICD-10-CM | POA: Diagnosis not present

## 2020-10-26 DIAGNOSIS — M21372 Foot drop, left foot: Secondary | ICD-10-CM | POA: Diagnosis not present

## 2020-10-26 DIAGNOSIS — M79603 Pain in arm, unspecified: Secondary | ICD-10-CM | POA: Diagnosis not present

## 2020-10-26 DIAGNOSIS — M79A12 Nontraumatic compartment syndrome of left upper extremity: Secondary | ICD-10-CM | POA: Diagnosis not present

## 2020-10-26 DIAGNOSIS — Z9049 Acquired absence of other specified parts of digestive tract: Secondary | ICD-10-CM

## 2020-10-26 DIAGNOSIS — I252 Old myocardial infarction: Secondary | ICD-10-CM

## 2020-10-26 DIAGNOSIS — Z993 Dependence on wheelchair: Secondary | ICD-10-CM

## 2020-10-26 LAB — CBC
HCT: 27.5 % — ABNORMAL LOW (ref 36.0–46.0)
Hemoglobin: 8.5 g/dL — ABNORMAL LOW (ref 12.0–15.0)
MCH: 29.8 pg (ref 26.0–34.0)
MCHC: 30.9 g/dL (ref 30.0–36.0)
MCV: 96.5 fL (ref 80.0–100.0)
Platelets: 287 10*3/uL (ref 150–400)
RBC: 2.85 MIL/uL — ABNORMAL LOW (ref 3.87–5.11)
RDW: 21.5 % — ABNORMAL HIGH (ref 11.5–15.5)
WBC: 14.6 10*3/uL — ABNORMAL HIGH (ref 4.0–10.5)
nRBC: 0 % (ref 0.0–0.2)

## 2020-10-26 MED ORDER — ONDANSETRON HCL 4 MG/2ML IJ SOLN: 4.0000 mg | Freq: Once | INTRAMUSCULAR | Status: DC

## 2020-10-26 MED ORDER — MORPHINE SULFATE (PF) 2 MG/ML IV SOLN: 2.0000 mg | Freq: Once | INTRAVENOUS | Status: DC

## 2020-10-26 MED ORDER — MORPHINE SULFATE (PF) 4 MG/ML IV SOLN
4.0000 mg | Freq: Once | INTRAVENOUS | Status: AC
  Administered 2020-10-26: 4 mg via INTRAMUSCULAR
  Filled 2020-10-26: qty 1

## 2020-10-26 NOTE — ED Triage Notes (Signed)
Infiltrated fistula to left arm since Tuesday and has been having pain ever since. Had dialysis on Saturday using right chest cath.    Denies any fever and chills.

## 2020-10-26 NOTE — ED Provider Notes (Signed)
[facility] EMERGENCY DEPARTMENT Provider Note   CSN: [id number] Arrival date & time: 10/26/20  2230     History Chief Complaint  Patient presents with  . Arm Pain    [name] is a 75 y.o. female w/ hx of A Fib on eliquis, ESRD on Tues, Thurs, Sat dialysis, here with left arm pain.  Reports her fistula infiltrated on Tuesday at dialysis.  She has had worsening pain all week.  Had right chest cath placed and completed dialysis Saturday as regularly scheduled.  For past 24 hours, swelling in left upper arm has increased, and left hand feeling numb and weak.  Never had this happen before. She is compliant with her eliquis.  Here with her daughter.  She does produce urine.  She's been on dialysis 6 years.  HPI     Past Medical History:  Diagnosis Date  . Arthritis of left knee   . Cardiomyopathy ([place])    a. h/o LV dysfunction EF 20-25% in 2013 due to sepsis.>> improved to normal   . Chronic diastolic CHF (congestive heart failure) ([place])    10/ 2013 in setting of septic shock  . Complication of anesthesia    use a little anesthesia , per patient MD states she quit breathing (2016); hard to wake up  . ESRD (end stage renal disease) ([place])    dialysis Tues, Thurs, Sat [address], sees dr deterding   . GERD (gastroesophageal reflux disease)   . Glaucoma    both eyes  . H/O hiatal hernia    a. CT 2017: large gastric hiatal hernia.  Marland Kitchen Headache(784.0)    migraine hx of  . History of blood transfusion 04/13/2015   . History of echocardiogram    a. Echo 6/17: EF 60-65%, normal wall motion, mild MR, atrial septal lipomatous hypertrophy, PASP 34 mmHg, possible trivial free-flowing pericardial effusion along RV free wall // b. Echo 5/17: Mild LVH, EF 55-60%, normal wall motion, grade 1 diastolic dysfunction, trivial MR, severe LAE, mild RAE, PASP 42 mmHg  . History of kidney stones    10/18/2019: per patient "has a couple currently one in each kidney"  . History  of nephrostomy 04/11/2015   currently inplace 04/28/2015  removed now  . History of nuclear stress test    a. Myoview 1/14 - Marked ischemia in the basal anterior, mid anterior, apical septal and apical inferior regions, EF 63% >> LHC normal   . Hyperlipidemia   . Hypertension    medication removed from regimen due to low blood pressure   . Iron deficiency    hx  . Myocardial infarction [facility]) 2013   10/18/2019: per patient "in 2013)  . Nephrolithiasis 2002, 2006   bilateral  . Normal coronary arteries 2014   a. LHC in 1/14: normal coronary arteries  . PAF (paroxysmal atrial fibrillation) ([place])    a. 10/ 2013  in setting of Septic Shock //  b. recurrent during admit for pneumonia, L effusion >> placed on Amiodarone // Coumadin for anticoagulation  . Pneumonia jan 2018, last tme lungs clear now   dx 10-06-2014 per CXR--  on 10-27-2014 pt states finished antibiotic and denies cough or fever  . Primary localized osteoarthritis of right hip 10/22/2019  . S/P hemodialysis catheter insertion ([place]) 04/11/2015    right anterior chest , only used once   . Sigmoid diverticulosis   . SOB (shortness of breath) on exertion    10/18/2019: per patient "get short of  breath with activity sometimes due to heart valve"  . UTI (urinary tract infection) 05/10/2016    Patient Active Problem List   Diagnosis Date Noted  . Compartment syndrome ([place]) 10/27/2020  . Hip fracture ([place]) 06/07/2020  . Leukocytosis 06/07/2020  . Acute hypoxemic respiratory failure ([place]) 06/07/2020  . Atrial fibrillation ([place]) 06/07/2020  . ESRD (end stage renal disease) ([place]) 06/07/2020  . Primary localized osteoarthritis of left knee 10/22/2019  . S/P TKR (total knee replacement), left 10/22/2019  . Gastroenteritis due to norovirus   . Anticoagulated on Coumadin   . Hiatal hernia   . Melena   . UGI bleed 08/23/2018  . Hypokalemia 08/23/2018  . Hyperlipidemia 08/23/2018  . GERD (gastroesophageal reflux disease) 08/23/2018   . Hypertension 08/23/2018  . CAP (community acquired pneumonia) 07/02/2016  . Dehydration   . PNA (pneumonia) 05/10/2016  . On amiodarone therapy 11/30/2015  . Pleural effusion on left   . Pleurisy   . Hypoxemia   . HCAP (healthcare-associated pneumonia) 11/21/2015  . S/P thoracentesis   . Chest pain 11/20/2015  . Pleural effusion, left 11/20/2015  . Pain in the chest   . Pericardial effusion   . Pleural effusion   . Encounter for therapeutic drug monitoring 11/13/2015  . Atrial fibrillation with RVR ([place])   . PAF (paroxysmal atrial fibrillation) ([place]) 11/05/2015  . Urinary tract infectious disease 10/31/2015  . Pyelonephritis 09/25/2015  . Sepsis ([place]) 09/25/2015  . Anemia of renal disease 08/14/2015  . Severe protein-calorie malnutrition ([place]) 05/27/2015  . ESRD (end stage renal disease) ([place])   . Normocytic anemia 11/23/2014  . Essential hypertension, benign 08/14/2013  . Glaucoma     Past Surgical History:  Procedure Laterality Date  . AV FISTULA PLACEMENT Left 06/02/2015   Procedure: BRACHIOCEPHALIC ARTERIOVENOUS (AV) FISTULA CREATION ;  Surgeon: Conrad Ol[place], MD;  Location: [facility];  Service: Vascular;  Laterality: Left;  . BASCILIC VEIN TRANSPOSITION Left 07/27/2015   Procedure: FIRST STAGE BASILIC VEIN TRANSPOSITION LEFT UPPER ARM;  Surgeon: Conrad Mayflower, MD;  Location: [place];  Service: Vascular;  Laterality: Left;  . BASCILIC VEIN TRANSPOSITION Left 09/2015   second phase  . BASCILIC VEIN TRANSPOSITION Left 10/12/2015   Procedure: SECOND STAGE BASILIC VEIN TRANSPOSITION LEFT ARM;  Surgeon: Conrad Danielson, MD;  Location: [facility];  Service: Vascular;  Laterality: Left;  . BREAST BIOPSY Left 08/23/07   benign fibrocystic with duct ectasia  . CARDIAC CATHETERIZATION  07-11-2012  dr Irish Lack   Abnormal stress test/   normal coronary arteries/  LVEDP  15mmHg  . CARDIOVASCULAR STRESS TEST  06-26-2012  dr Irish Lack   marked ischemia in the basal anterior, mid anterior, apical  inferior regions/  normal LVF, ef 63%  . CATARACT EXTRACTION W/ INTRAOCULAR LENS  IMPLANT, BILATERAL    . COLONOSCOPY WITH PROPOFOL N/A 10/17/2016   Procedure: COLONOSCOPY WITH PROPOFOL;  Surgeon: Garlan Fair, MD;  Location: [facility];  Service: Endoscopy;  Laterality: N/A;  . CYSTOSCOPY W/ URETERAL STENT PLACEMENT  04/04/2012   Procedure: CYSTOSCOPY WITH RETROGRADE PYELOGRAM/URETERAL STENT PLACEMENT;  Surgeon: Ailene Rud, MD;  Location: [place] [facility];  Service: Urology;  Laterality: Left;  . CYSTOSCOPY W/ URETERAL STENT PLACEMENT Bilateral 05/04/2015   Procedure: CYSTOSCOPY WITH BILATERAL RETROGRADE PYELOGRAM/ WITH INTERPRETATION, EXCHANGE OF RIGHT URETERAL STENT REPLACEMENT AND PLACEMENT LEFT URETERAL STENT PLACEMENT EXAMINATION OF VAGINA;  Surgeon: Carolan Clines, MD;  Location: [facility];  Service: Urology;  Laterality: Bilateral;  . [place]  Right 10/28/2014   Procedure: RIGHT URETERAL STENT PLACEMENT;  Surgeon: Irine Seal, MD;  Location: [facility];  Service: Urology;  Laterality: Right;  . CYSTOSCOPY WITH STENT PLACEMENT Right 02/26/2015   Procedure: CYSTOSCOPY RETROGRADE PYELOGRAM WITH STENT PLACEMENT;  Surgeon: Cleon Gustin, MD;  Location: [facility];  Service: Urology;  Laterality: Right;  . CYSTOSCOPY/RETROGRADE/URETEROSCOPY/STONE EXTRACTION WITH BASKET Right 11/21/2014   Procedure: CYSTOSCOPY/RIGHT RETROGRADE PYELOGRAM/RIGHT URETEROSCOPY/BASKET EXTRACTION/RIGHT PYELOSCOPY/LASER OF STONE/RIGHT DOUBLE J STENT;  Surgeon: Carolan Clines, MD;  Location: [facility];  Service: Urology;  Laterality: Right;  . ESOPHAGOGASTRODUODENOSCOPY (EGD) WITH PROPOFOL Left 08/25/2018   Procedure: ESOPHAGOGASTRODUODENOSCOPY (EGD) WITH PROPOFOL;  Surgeon: Ronald Lobo, MD;  Location: [facility];  Service: Endoscopy;  Laterality: Left;  . EXTRACORPOREAL SHOCK WAVE LITHOTRIPSY  05-28-2012  &  10-08-2012  . HIP ARTHROPLASTY Left 06/09/2020    Procedure: ARTHROPLASTY BIPOLAR HIP (HEMIARTHROPLASTY);  Surgeon: Marchia Bond, MD;  Location: [place] [facility];  Service: Orthopedics;  Laterality: Left;  . HOLMIUM LASER APPLICATION Right 02/24/7892   Procedure: HOLMIUM LASER APPLICATION;  Surgeon: Carolan Clines, MD;  Location[facility];  Service: Urology;  Laterality: Right;  . IR GENERIC HISTORICAL  02/01/2016   IR NEPHROSTOMY EXCHANGE RIGHT 02/01/2016 Greggory Keen, MD [facility]  . IR GENERIC HISTORICAL  02/24/2016   IR PATIENT EVAL TECH 0-60 MINS 02/24/2016 Aletta Edouard, MD [facility]  . KNEE ARTHROSCOPY Left 02-14-2003  . LAPAROSCOPIC CHOLECYSTECTOMY  03-23-2005  . TOTAL ABDOMINAL HYSTERECTOMY W/ BILATERAL SALPINGOOPHORECTOMY  1993   secondary to fibroids  . TOTAL KNEE ARTHROPLASTY Left 10/22/2019  . TOTAL KNEE ARTHROPLASTY Left 10/22/2019   Procedure: TOTAL KNEE ARTHROPLASTY;  Surgeon: Marchia Bond, MD;  Location: [facility];  Service: Orthopedics;  Laterality: Left;  . TRANSTHORACIC ECHOCARDIOGRAM  04-09-2012   normal LVF,  ef 60-65%,  mild LAE,  mild TR, trivial MR and PR     OB History    Gravida  1   Para  1   Term  1   Preterm  0   AB  0   Living  1     SAB  0   IAB  0   Ectopic  0   Multiple      Live Births  1           Family History  Problem Relation Age of Onset  . Hypertension Mother   . Cancer Mother 25       breast  . Dementia Mother   . Hypertension Brother   . Diabetes Brother   . Heart disease Brother        before age 17  . Cancer Father 82       pancreatic  . Heart failure Paternal Grandmother   . Bladder Cancer Maternal Grandfather     Social History   Tobacco Use  . Smoking status: Never Smoker  . Smokeless tobacco: Never Used  Vaping Use  . Vaping Use: Never used  Substance Use Topics  . Alcohol use: No    Alcohol/week: 0.0 standard drinks  . Drug use: No    Home Medications Prior to Admission medications   Medication Sig Start Date End Date Taking?  Authorizing Provider  acetaminophen (TYLENOL) 325 MG tablet Take 2 tablets (650 mg total) by mouth every 6 (six) hours as needed for mild pain, fever or headache. 06/24/20   Alma Friendly, MD  amiodarone (PACERONE) 200 MG tablet Take 1 tablet (200 mg total) by mouth  daily. 08/24/20   Jettie Booze, MD  apixaban (ELIQUIS) 2.5 MG TABS tablet Take 1 tablet (2.5 mg total) by mouth 2 (two) times daily. 09/14/20   Jettie Booze, MD  bimatoprost (LUMIGAN) 0.01 % SOLN Place 1 drop into both eyes at bedtime.    [provider]  bimatoprost (LUMIGAN) 0.01 % SOLN Place 1 drop into both eyes at bedtime.    [provider]  calcium acetate (PHOSLO) 667 MG capsule Take 1,334 mg by mouth 3 (three) times daily with meals.     [provider]  calcium acetate (PHOSLO) 667 MG capsule Take 1 capsule (667 mg total) by mouth 3 (three) times daily with meals. 06/24/20   Alma Friendly, MD  cephALEXin (KEFLEX) 250 MG capsule Take 250 mg by mouth daily. 05/24/20   [provider]  cephALEXin (KEFLEX) 500 MG capsule Take 1 capsule (500 mg total) by mouth 4 (four) times daily. 07/17/20   Deno Etienne, DO  diphenhydrAMINE (BENADRYL) 25 MG tablet Take 1 tablet (25 mg total) by mouth every 8 (eight) hours as needed. 06/24/20   Alma Friendly, MD  lidocaine-prilocaine (EMLA) cream Apply 1 application topically 3 (three) times a week.  09/09/19   [provider]  lidocaine-prilocaine (EMLA) cream Apply 1 application topically 3 (three) times a week. 05/23/20   [provider]  loperamide (IMODIUM A-D) 2 MG tablet Take 4-6 mg by mouth 4 (four) times daily as needed for diarrhea or loose stools.    [provider]  loperamide (IMODIUM A-D) 2 MG tablet Take 2 mg by mouth 4 (four) times daily as needed for diarrhea or loose stools.    [provider]  multivitamin (RENA-VIT) TABS tablet Take 1 tablet by mouth at bedtime. 07/27/19   [provider]  multivitamin (RENA-VIT) TABS tablet Take 1 tablet by mouth daily. 05/11/20   [provider]  Nutritional Supplements (FEEDING SUPPLEMENT, NEPRO CARB STEADY,) LIQD Take 237 mLs by mouth 2 (two) times daily between meals. 06/24/20   Alma Friendly, MD  ondansetron (ZOFRAN) 4 MG tablet Take 4 mg by mouth every 8 (eight) hours as needed for nausea or vomiting.    [provider]  oxyCODONE (ROXICODONE) 5 MG immediate release tablet Take 1 tablet (5 mg total) by mouth every 4 (four) hours as needed for severe pain. 10/24/19   Ventura Bruns, PA-C  saccharomyces boulardii (FLORASTOR) 250 MG capsule Take 1 capsule (250 mg total) by mouth 2 (two) times daily. 07/08/15   Donne Hazel, MD  saccharomyces boulardii (FLORASTOR) 250 MG capsule Take 250 mg by mouth daily.    [provider]  sodium chloride (OCEAN) 0.65 % SOLN nasal spray Place 1 spray into both nostrils 3 (three) times daily as needed for congestion.    [provider]  timolol (BETIMOL) 0.5 % ophthalmic solution Place 1 drop into both eyes every morning.     [provider]  timolol (BETIMOL) 0.5 % ophthalmic solution Place 1 drop into both eyes daily.    [provider]    Allergies    Astemizole, Fluorouracil, Percocet [oxycodone-acetaminophen], Vicodin [hydrocodone-acetaminophen], Vicodin [hydrocodone-acetaminophen], Chlorhexidine, Chlorhexidine, and Percocet [oxycodone-acetaminophen]  Review of Systems   Review of Systems  Constitutional: Negative for chills and fever.  HENT: Negative for ear pain and sore throat.   Eyes: Negative for pain and visual disturbance.  Respiratory: Negative for cough and shortness of breath.   Cardiovascular: Negative for chest  pain and palpitations.  Gastrointestinal: Negative for abdominal pain and vomiting.  Musculoskeletal: Positive for arthralgias and myalgias.  Skin: Negative for color change and rash.  Neurological:  Positive for weakness and numbness.  All other systems reviewed and are negative.   Physical Exam Updated Vital Signs BP (!) 164/83   Pulse 70   Temp 97.8 F (36.6 C) (Oral)   Resp 15   Ht 5\' 3"  (1.6 m)   Wt 77.6 kg   LMP 01/19/1992 (Approximate)   SpO2 98%   BMI 30.30 kg/m   Physical Exam Constitutional:      General: She is not in acute distress. HENT:     Head: Normocephalic and atraumatic.  Eyes:     Conjunctiva/sclera: Conjunctivae normal.     Pupils: Pupils are equal, round, and reactive to light.  Cardiovascular:     Rate and Rhythm: Regular rhythm. Tachycardia present.  Pulmonary:     Effort: Pulmonary effort is normal. No respiratory distress.  Abdominal:     General: There is no distension.     Tenderness: There is no abdominal tenderness.  Skin:    General: Skin is warm and dry.  Neurological:     General: No focal deficit present.     Mental Status: She is alert. Mental status is at baseline.  Psychiatric:        Mood and Affect: Mood normal.        Behavior: Behavior normal.         ED Results / Procedures / Treatments   Labs (all labs ordered are listed, but only abnormal results are displayed) Labs Reviewed  CBC - Abnormal; Notable for the following components:      Result Value   WBC 14.6 (*)    RBC 2.85 (*)    Hemoglobin 8.5 (*)    HCT 27.5 (*)    RDW 21.5 (*)    All other components within normal limits  COMPREHENSIVE METABOLIC PANEL - Abnormal; Notable for the following components:   Chloride 97 (*)    Glucose, Bld 108 (*)    Creatinine, Ser 4.27 (*)    Calcium 8.5 (*)    Total Protein 4.5 (*)    Albumin 2.0 (*)    Total Bilirubin 1.5 (*)    GFR, Estimated 10 (*)    All other components within normal limits  RESP PANEL BY RT-PCR (FLU A&B, COVID) ARPGX2  CBC  COMPREHENSIVE METABOLIC PANEL  MAGNESIUM  PHOSPHORUS  PROTIME-INR  TYPE AND SCREEN    EKG None  Radiology CT Extrem Up Entire Arm L WO/CM  Result Date:  10/27/2020 CLINICAL DATA:  Rupture of arm fistula few days ago with interval increase in arm circumference. Marked edema and discoloration of the arm. Concern for compartment syndrome. Unable to insert IV. EXAM: CT OF THE UPPER LEFT EXTREMITY WITHOUT CONTRAST TECHNIQUE: Multidetector CT imaging of the upper left extremity was performed according to the standard protocol. COMPARISON:  None. FINDINGS: Bones/Joint/Cartilage No acute displaced fracture or dislocation. No cortical erosion or destruction. Partially visualized left hip arthroplasty. Ligaments Suboptimally assessed by CT. Muscles and Tendons Large hypo to isodense lesion within the biceps muscle extending at least 17 cm in the craniocaudal dimension and measuring up to approximately 6 x 6 cm on axial images (5:72, 11:15). Soft tissues Extensive subcutaneus soft tissue edema of the entire left upper extremity as well as left chest with associated areas of dermal thickening. No subcutaneus soft tissue emphysema. Medial to the biceps  muscle along the distal arm there is a fluid hypodense collection measuring approximately 4.5 x 2 cm on axial imaging that appears external to the musculature and is likely associated with the fistula. Unable to evaluate vasculature on this noncontrast study. Unable to evaluate known left upper extremity fistula. Anasarca noted. Other: Right chest wall central catheter with tip terminating at the superior cavoatrial junction. Coronary calcifications. Trace small volume left pleural effusion with associated passive atelectasis left lower lobe. Prominent cardiac size with suggestion of anemia given hyperdensity of the myocardium compared to the cardiac chambers. Large hiatal hernia. Atrophic kidneys. At least trace volume ascites. Colonic diverticulosis. Atherosclerotic plaque of the aorta. Question axillary lymphadenopathy on the left. IMPRESSION: 1. Finding likely represent interval development of a large intramuscular biceps  hematoma (6 x 6 x 17 cm). Associated extensive left upper extremity edema. Underlying mass lesion/tumor not excluded. Compartment syndrome is probable in the setting. 2. Medial to the biceps muscle along the distal arm there is a fluid hypodense collection measuring approximately 4.5 x 2 cm that appears external to the muscle and is likely associated with the fistula. Markedly limited evaluation of a known fistula due to noncontrast study. 3. Left trace to small volume pleural effusion with associated atelectasis of the left lower lobe. 4. Question associated axillary lymphadenopathy on the left. 5. Other imaging findings of potential clinical significance: Large hiatal hernia. Volume overload with anasarca and at least trace volume ascites. Aortic Atherosclerosis (ICD10-I70.0). These results were called by telephone at the time of interpretation on 10/27/2020 at 1:35 am to provider [facility] , who verbally acknowledged these results. Electronically Signed   By: Iven Finn M.D.   On: 10/27/2020 01:47    Procedures Procedures   Medications Ordered in ED Medications  ondansetron (ZOFRAN-ODT) disintegrating tablet 4 mg ( Oral MAR Hold 10/27/20 0408)  HYDROmorphone (DILAUDID) injection 0.5 mg ( Intravenous MAR Hold 10/27/20 0408)  oxyCODONE (Oxy IR/ROXICODONE) immediate release tablet 5 mg ( Oral MAR Hold 10/27/20 0408)  senna-docusate (Senokot-S) tablet 2 tablet ( Oral Automatically Held 11/04/20 2200)  acetaminophen (TYLENOL) tablet 650 mg ( Oral MAR Hold 10/27/20 0408)  prochlorperazine (COMPAZINE) injection 10 mg ( Intravenous MAR Hold 10/27/20 0408)  amiodarone (PACERONE) tablet 200 mg ( Oral Automatically Held 11/04/20 1000)  timolol (TIMOPTIC) 0.5 % ophthalmic solution 1 drop ( Both Eyes Automatically Held 11/04/20 1000)  metoprolol tartrate (LOPRESSOR) injection 2.5 mg (has no administration in time range)  sodium chloride irrigation 0.9 % (1,000 mLs  Given 10/27/20 0414)  morphine 4 MG/ML  injection 4 mg (4 mg Intramuscular Given 10/26/20 2359)  morphine 4 MG/ML injection 4 mg (4 mg Intramuscular Given 10/27/20 0246)    ED Course  I have reviewed the triage vital signs and the nursing notes.  Pertinent labs & imaging results that were available during my care of the patient were reviewed by me and considered in my medical decision making (see chart for details).  75 yo female here with left arm pain Concerning for compartment syndrome 2/2 expanding hematoma in left upper arm, likely related to AV fistula complication Ecchymosis concerning for bleeding  Less likely DVT - she is on eliquis  CT ordered showing likely hematoma, surrounding skin edema Labs at baseline - no indication for emergent dialysis  IM pain medications given   Clinical Course as of 10/27/20 0505  Tue Oct 27, 2020  0003 Hgb stable 8.5 [MT]  0140 Appears to have large hematoma in biceps or triceps compartment  of the left upper arm, suspect ct shows significant surrounding edema.  Less likely nec fasc [MT]  6270 I spoke to Dr Donzetta Matters from vascular surgery who will come evaluate patient. [MT]  6 Dr Donzetta Matters spoke to me and is planning to take the patient to the OR, asked for covid screening swab.  Will page medicine for admission. [MT]    Clinical Course User Index [MT] Oliver Neuwirth, Carola Rhine, MD    Final Clinical Impression(s) / ED Diagnoses Final diagnoses:  Left arm pain    Rx / DC Orders ED Discharge Orders    None       Taelynn Mcelhannon, Carola Rhine, MD 10/27/20 [phone]

## 2020-10-26 NOTE — ED Triage Notes (Signed)
Pt appears pale, left upper arm swollen and left lower arm bruised/ hematoma.

## 2020-10-27 ENCOUNTER — Encounter (HOSPITAL_COMMUNITY): Payer: Self-pay | Admitting: Internal Medicine

## 2020-10-27 ENCOUNTER — Inpatient Hospital Stay (HOSPITAL_COMMUNITY): Payer: Medicare Other | Admitting: Certified Registered Nurse Anesthetist

## 2020-10-27 ENCOUNTER — Emergency Department (HOSPITAL_COMMUNITY): Payer: Medicare Other

## 2020-10-27 ENCOUNTER — Encounter (HOSPITAL_COMMUNITY)
Admission: EM | Disposition: A | Discharge status: Rehabilitation Facility | Payer: Self-pay | Source: Home / Self Care | Attending: Internal Medicine

## 2020-10-27 ENCOUNTER — Telehealth: Payer: Self-pay | Admitting: Interventional Cardiology

## 2020-10-27 DIAGNOSIS — I132 Hypertensive heart and chronic kidney disease with heart failure and with stage 5 chronic kidney disease, or end stage renal disease: Secondary | ICD-10-CM | POA: Diagnosis present

## 2020-10-27 DIAGNOSIS — Z7901 Long term (current) use of anticoagulants: Secondary | ICD-10-CM | POA: Diagnosis not present

## 2020-10-27 DIAGNOSIS — G8918 Other acute postprocedural pain: Secondary | ICD-10-CM | POA: Diagnosis not present

## 2020-10-27 DIAGNOSIS — G7281 Critical illness myopathy: Secondary | ICD-10-CM | POA: Diagnosis not present

## 2020-10-27 DIAGNOSIS — D62 Acute posthemorrhagic anemia: Secondary | ICD-10-CM

## 2020-10-27 DIAGNOSIS — I120 Hypertensive chronic kidney disease with stage 5 chronic kidney disease or end stage renal disease: Secondary | ICD-10-CM | POA: Diagnosis not present

## 2020-10-27 DIAGNOSIS — R195 Other fecal abnormalities: Secondary | ICD-10-CM | POA: Diagnosis not present

## 2020-10-27 DIAGNOSIS — I5042 Chronic combined systolic (congestive) and diastolic (congestive) heart failure: Secondary | ICD-10-CM | POA: Diagnosis present

## 2020-10-27 DIAGNOSIS — I721 Aneurysm of artery of upper extremity: Secondary | ICD-10-CM

## 2020-10-27 DIAGNOSIS — R5381 Other malaise: Secondary | ICD-10-CM | POA: Diagnosis not present

## 2020-10-27 DIAGNOSIS — T82590A Other mechanical complication of surgically created arteriovenous fistula, initial encounter: Secondary | ICD-10-CM

## 2020-10-27 DIAGNOSIS — K641 Second degree hemorrhoids: Secondary | ICD-10-CM | POA: Diagnosis not present

## 2020-10-27 DIAGNOSIS — K219 Gastro-esophageal reflux disease without esophagitis: Secondary | ICD-10-CM | POA: Diagnosis present

## 2020-10-27 DIAGNOSIS — I5032 Chronic diastolic (congestive) heart failure: Secondary | ICD-10-CM

## 2020-10-27 DIAGNOSIS — T82510A Breakdown (mechanical) of surgically created arteriovenous fistula, initial encounter: Secondary | ICD-10-CM | POA: Diagnosis present

## 2020-10-27 DIAGNOSIS — I350 Nonrheumatic aortic (valve) stenosis: Secondary | ICD-10-CM | POA: Diagnosis present

## 2020-10-27 DIAGNOSIS — R0989 Other specified symptoms and signs involving the circulatory and respiratory systems: Secondary | ICD-10-CM | POA: Diagnosis not present

## 2020-10-27 DIAGNOSIS — E785 Hyperlipidemia, unspecified: Secondary | ICD-10-CM | POA: Diagnosis present

## 2020-10-27 DIAGNOSIS — I480 Paroxysmal atrial fibrillation: Secondary | ICD-10-CM | POA: Diagnosis present

## 2020-10-27 DIAGNOSIS — N186 End stage renal disease: Secondary | ICD-10-CM | POA: Diagnosis not present

## 2020-10-27 DIAGNOSIS — Y712 Prosthetic and other implants, materials and accessory cardiovascular devices associated with adverse incidents: Secondary | ICD-10-CM | POA: Diagnosis present

## 2020-10-27 DIAGNOSIS — T79A0XA Compartment syndrome, unspecified, initial encounter: Secondary | ICD-10-CM

## 2020-10-27 DIAGNOSIS — H409 Unspecified glaucoma: Secondary | ICD-10-CM | POA: Diagnosis present

## 2020-10-27 DIAGNOSIS — M79A12 Nontraumatic compartment syndrome of left upper extremity: Secondary | ICD-10-CM | POA: Diagnosis not present

## 2020-10-27 DIAGNOSIS — Z992 Dependence on renal dialysis: Secondary | ICD-10-CM | POA: Diagnosis not present

## 2020-10-27 DIAGNOSIS — R200 Anesthesia of skin: Secondary | ICD-10-CM | POA: Diagnosis not present

## 2020-10-27 DIAGNOSIS — D631 Anemia in chronic kidney disease: Secondary | ICD-10-CM | POA: Diagnosis not present

## 2020-10-27 DIAGNOSIS — S40022A Contusion of left upper arm, initial encounter: Secondary | ICD-10-CM | POA: Diagnosis not present

## 2020-10-27 DIAGNOSIS — Z961 Presence of intraocular lens: Secondary | ICD-10-CM | POA: Diagnosis present

## 2020-10-27 DIAGNOSIS — Z20822 Contact with and (suspected) exposure to covid-19: Secondary | ICD-10-CM | POA: Diagnosis present

## 2020-10-27 DIAGNOSIS — N2581 Secondary hyperparathyroidism of renal origin: Secondary | ICD-10-CM | POA: Diagnosis present

## 2020-10-27 DIAGNOSIS — Z87442 Personal history of urinary calculi: Secondary | ICD-10-CM | POA: Diagnosis not present

## 2020-10-27 DIAGNOSIS — E8889 Other specified metabolic disorders: Secondary | ICD-10-CM | POA: Diagnosis present

## 2020-10-27 DIAGNOSIS — T79A12A Traumatic compartment syndrome of left upper extremity, initial encounter: Secondary | ICD-10-CM | POA: Diagnosis present

## 2020-10-27 DIAGNOSIS — D649 Anemia, unspecified: Secondary | ICD-10-CM | POA: Diagnosis not present

## 2020-10-27 DIAGNOSIS — N390 Urinary tract infection, site not specified: Secondary | ICD-10-CM | POA: Diagnosis not present

## 2020-10-27 DIAGNOSIS — I340 Nonrheumatic mitral (valve) insufficiency: Secondary | ICD-10-CM

## 2020-10-27 DIAGNOSIS — R203 Hyperesthesia: Secondary | ICD-10-CM | POA: Diagnosis present

## 2020-10-27 DIAGNOSIS — D72829 Elevated white blood cell count, unspecified: Secondary | ICD-10-CM | POA: Diagnosis not present

## 2020-10-27 DIAGNOSIS — M1712 Unilateral primary osteoarthritis, left knee: Secondary | ICD-10-CM | POA: Diagnosis present

## 2020-10-27 DIAGNOSIS — Z96652 Presence of left artificial knee joint: Secondary | ICD-10-CM | POA: Diagnosis present

## 2020-10-27 HISTORY — PX: HEMATOMA EVACUATION: SHX5118

## 2020-10-27 HISTORY — DX: Compartment syndrome, unspecified, initial encounter: T79.A0XA

## 2020-10-27 LAB — COMPREHENSIVE METABOLIC PANEL
ALT: 12 U/L (ref 0–44)
ALT: 12 U/L (ref 0–44)
AST: 23 U/L (ref 15–41)
AST: 22 U/L (ref 15–41)
Albumin: 2 g/dL — ABNORMAL LOW (ref 3.5–5.0)
Albumin: 2.6 g/dL — ABNORMAL LOW (ref 3.5–5.0)
Alkaline Phosphatase: 97 U/L (ref 38–126)
Alkaline Phosphatase: 74 U/L (ref 38–126)
Anion gap: 11 (ref 5–15)
Anion gap: 10 (ref 5–15)
BUN: 19 mg/dL (ref 8–23)
BUN: 21 mg/dL (ref 8–23)
CO2: 28 mmol/L (ref 22–32)
CO2: 29 mmol/L (ref 22–32)
Calcium: 8.5 mg/dL — ABNORMAL LOW (ref 8.9–10.3)
Calcium: 8.3 mg/dL — ABNORMAL LOW (ref 8.9–10.3)
Chloride: 97 mmol/L — ABNORMAL LOW (ref 98–111)
Chloride: 97 mmol/L — ABNORMAL LOW (ref 98–111)
Creatinine, Ser: 4.27 mg/dL — ABNORMAL HIGH (ref 0.44–1.00)
Creatinine, Ser: 4.53 mg/dL — ABNORMAL HIGH (ref 0.44–1.00)
GFR, Estimated: 10 mL/min — ABNORMAL LOW (ref >60)
GFR, Estimated: 10 mL/min — ABNORMAL LOW (ref >60)
Glucose, Bld: 108 mg/dL — ABNORMAL HIGH (ref 70–99)
Glucose, Bld: 109 mg/dL — ABNORMAL HIGH (ref 70–99)
Potassium: 3.5 mmol/L (ref 3.5–5.1)
Potassium: 3.6 mmol/L (ref 3.5–5.1)
Sodium: 136 mmol/L (ref 135–145)
Sodium: 136 mmol/L (ref 135–145)
Total Bilirubin: 1.5 mg/dL — ABNORMAL HIGH (ref 0.3–1.2)
Total Bilirubin: 1.5 mg/dL — ABNORMAL HIGH (ref 0.3–1.2)
Total Protein: 4.5 g/dL — ABNORMAL LOW (ref 6.5–8.1)
Total Protein: 4.6 g/dL — ABNORMAL LOW (ref 6.5–8.1)

## 2020-10-27 LAB — CBC
HCT: 19.6 % — ABNORMAL LOW (ref 36.0–46.0)
HCT: 19.8 % — ABNORMAL LOW (ref 36.0–46.0)
Hemoglobin: 6.4 g/dL — CL (ref 12.0–15.0)
Hemoglobin: 6.3 g/dL — CL (ref 12.0–15.0)
MCH: 30 pg (ref 26.0–34.0)
MCH: 29.4 pg (ref 26.0–34.0)
MCHC: 32.7 g/dL (ref 30.0–36.0)
MCHC: 31.8 g/dL (ref 30.0–36.0)
MCV: 92 fL (ref 80.0–100.0)
MCV: 92.5 fL (ref 80.0–100.0)
Platelets: 190 10*3/uL (ref 150–400)
Platelets: 192 10*3/uL (ref 150–400)
RBC: 2.13 MIL/uL — ABNORMAL LOW (ref 3.87–5.11)
RBC: 2.14 MIL/uL — ABNORMAL LOW (ref 3.87–5.11)
RDW: 21.2 % — ABNORMAL HIGH (ref 11.5–15.5)
RDW: 21.2 % — ABNORMAL HIGH (ref 11.5–15.5)
WBC: 11.3 10*3/uL — ABNORMAL HIGH (ref 4.0–10.5)
WBC: 10.3 10*3/uL (ref 4.0–10.5)
nRBC: 0 % (ref 0.0–0.2)
nRBC: 0 % (ref 0.0–0.2)

## 2020-10-27 LAB — MRSA PCR SCREENING: MRSA by PCR: NEGATIVE

## 2020-10-27 LAB — HEPATITIS B CORE ANTIBODY, TOTAL: Hep B Core Total Ab: NONREACTIVE

## 2020-10-27 LAB — PREPARE RBC (CROSSMATCH)

## 2020-10-27 LAB — HEPATITIS B SURFACE ANTIGEN: Hepatitis B Surface Ag: NONREACTIVE

## 2020-10-27 LAB — RESP PANEL BY RT-PCR (FLU A&B, COVID) ARPGX2
Influenza A by PCR: NEGATIVE
Influenza B by PCR: NEGATIVE
SARS Coronavirus 2 by RT PCR: NEGATIVE

## 2020-10-27 LAB — MAGNESIUM: Magnesium: 2 mg/dL (ref 1.7–2.4)

## 2020-10-27 LAB — PHOSPHORUS: Phosphorus: 3.6 mg/dL (ref 2.5–4.6)

## 2020-10-27 LAB — PROTIME-INR
INR: 1.8 — ABNORMAL HIGH (ref 0.8–1.2)
Prothrombin Time: 20.6 seconds — ABNORMAL HIGH (ref 11.4–15.2)

## 2020-10-27 SURGERY — EVACUATION HEMATOMA
Anesthesia: General | Site: Arm Upper | Laterality: Left

## 2020-10-27 MED ORDER — MORPHINE SULFATE (PF) 4 MG/ML IV SOLN
4.0000 mg | Freq: Once | INTRAVENOUS | Status: AC
  Administered 2020-10-27: 4 mg via INTRAMUSCULAR
  Filled 2020-10-27 (×2): qty 1

## 2020-10-27 MED ORDER — PENTAFLUOROPROP-TETRAFLUOROETH EX AERO: 1.0000 "application " | INHALATION_SPRAY | CUTANEOUS | Status: DC | PRN

## 2020-10-27 MED ORDER — PHENYLEPHRINE HCL-NACL 10-0.9 MG/250ML-% IV SOLN
INTRAVENOUS | Status: DC | PRN
  Administered 2020-10-27: 20 ug/min via INTRAVENOUS

## 2020-10-27 MED ORDER — PHENYLEPHRINE HCL-NACL 10-0.9 MG/250ML-% IV SOLN
0.0000 ug/min | INTRAVENOUS | Status: DC
  Administered 2020-10-27: 40 ug/min via INTRAVENOUS

## 2020-10-27 MED ORDER — SUCCINYLCHOLINE CHLORIDE 200 MG/10ML IV SOSY
PREFILLED_SYRINGE | INTRAVENOUS | Status: AC
  Filled 2020-10-27: qty 10

## 2020-10-27 MED ORDER — PHENYLEPHRINE HCL (PRESSORS) 10 MG/ML IV SOLN
INTRAVENOUS | Status: AC
  Filled 2020-10-27: qty 1

## 2020-10-27 MED ORDER — ONDANSETRON HCL 4 MG/2ML IJ SOLN
INTRAMUSCULAR | Status: DC | PRN
  Administered 2020-10-27: 4 mg via INTRAVENOUS

## 2020-10-27 MED ORDER — SODIUM CHLORIDE 0.9 % IV SOLN: INTRAVENOUS | Status: DC | PRN

## 2020-10-27 MED ORDER — PHENYLEPHRINE HCL (PRESSORS) 10 MG/ML IV SOLN
INTRAVENOUS | Status: DC | PRN
  Administered 2020-10-27 (×2): 80 ug via INTRAVENOUS

## 2020-10-27 MED ORDER — SODIUM CHLORIDE 0.9 % IV SOLN: 100.0000 mL | INTRAVENOUS | Status: DC | PRN

## 2020-10-27 MED ORDER — AMIODARONE HCL 200 MG PO TABS
200.0000 mg | ORAL_TABLET | Freq: Every day | ORAL | Status: DC
  Administered 2020-10-27 – 2020-11-02 (×7): 200 mg via ORAL
  Filled 2020-10-27 (×7): qty 1

## 2020-10-27 MED ORDER — TIMOLOL MALEATE 0.5 % OP SOLN
1.0000 [drp] | Freq: Every day | OPHTHALMIC | Status: DC
  Administered 2020-10-27 – 2020-11-02 (×7): 1 [drp] via OPHTHALMIC
  Filled 2020-10-27: qty 5

## 2020-10-27 MED ORDER — ETOMIDATE 2 MG/ML IV SOLN
INTRAVENOUS | Status: DC | PRN
  Administered 2020-10-27: 14 mg via INTRAVENOUS

## 2020-10-27 MED ORDER — SODIUM CHLORIDE 0.9 % IV SOLN
INTRAVENOUS | Status: AC
  Filled 2020-10-27: qty 1.2

## 2020-10-27 MED ORDER — SODIUM CHLORIDE 0.9% IV SOLUTION: Freq: Once | INTRAVENOUS | Status: DC

## 2020-10-27 MED ORDER — LIDOCAINE 2% (20 MG/ML) 5 ML SYRINGE
INTRAMUSCULAR | Status: AC
  Filled 2020-10-27: qty 5

## 2020-10-27 MED ORDER — EPHEDRINE 5 MG/ML INJ
INTRAVENOUS | Status: AC
  Filled 2020-10-27: qty 10

## 2020-10-27 MED ORDER — DEXAMETHASONE SODIUM PHOSPHATE 10 MG/ML IJ SOLN
INTRAMUSCULAR | Status: AC
  Filled 2020-10-27: qty 1

## 2020-10-27 MED ORDER — OXYCODONE HCL 5 MG PO TABS
5.0000 mg | ORAL_TABLET | Freq: Four times a day (QID) | ORAL | Status: DC | PRN
  Administered 2020-10-27 – 2020-11-03 (×22): 5 mg via ORAL
  Filled 2020-10-27 (×23): qty 1

## 2020-10-27 MED ORDER — PROPOFOL 10 MG/ML IV BOLUS
INTRAVENOUS | Status: AC
  Filled 2020-10-27: qty 20

## 2020-10-27 MED ORDER — CEFAZOLIN SODIUM-DEXTROSE 2-3 GM-%(50ML) IV SOLR
INTRAVENOUS | Status: DC | PRN
  Administered 2020-10-27: 2 g via INTRAVENOUS

## 2020-10-27 MED ORDER — LIDOCAINE HCL (PF) 1 % IJ SOLN: 5.0000 mL | INTRAMUSCULAR | Status: DC | PRN

## 2020-10-27 MED ORDER — ETOMIDATE 2 MG/ML IV SOLN
INTRAVENOUS | Status: AC
  Filled 2020-10-27: qty 10

## 2020-10-27 MED ORDER — SUCCINYLCHOLINE CHLORIDE 20 MG/ML IJ SOLN
INTRAMUSCULAR | Status: DC | PRN
  Administered 2020-10-27: 100 mg via INTRAVENOUS

## 2020-10-27 MED ORDER — HYDROMORPHONE HCL 1 MG/ML IJ SOLN
0.5000 mg | INTRAMUSCULAR | Status: DC | PRN
Start: 2020-10-27 — End: 2020-11-03
  Administered 2020-10-27 – 2020-11-01 (×3): 0.5 mg via INTRAVENOUS
  Filled 2020-10-27 (×3): qty 1

## 2020-10-27 MED ORDER — ONDANSETRON 4 MG PO TBDP: 4.0000 mg | ORAL_TABLET | Freq: Once | ORAL | Status: DC

## 2020-10-27 MED ORDER — ALBUMIN HUMAN 5 % IV SOLN
INTRAVENOUS | Status: AC
  Filled 2020-10-27: qty 500

## 2020-10-27 MED ORDER — EPHEDRINE SULFATE 50 MG/ML IJ SOLN
INTRAMUSCULAR | Status: DC | PRN
  Administered 2020-10-27: 10 mg via INTRAVENOUS

## 2020-10-27 MED ORDER — ALTEPLASE 2 MG IJ SOLR: 2.0000 mg | Freq: Once | INTRAMUSCULAR | Status: DC | PRN

## 2020-10-27 MED ORDER — ALBUMIN HUMAN 5 % IV SOLN
12.5000 g | INTRAVENOUS | Status: DC | PRN
  Administered 2020-10-27: 12.5 g via INTRAVENOUS

## 2020-10-27 MED ORDER — CEFAZOLIN SODIUM 1 G IJ SOLR
INTRAMUSCULAR | Status: AC
  Filled 2020-10-27: qty 20

## 2020-10-27 MED ORDER — ACETAMINOPHEN 325 MG PO TABS
650.0000 mg | ORAL_TABLET | Freq: Four times a day (QID) | ORAL | Status: DC | PRN
  Administered 2020-10-27 – 2020-11-03 (×16): 650 mg via ORAL
  Filled 2020-10-27 (×16): qty 2

## 2020-10-27 MED ORDER — PHENYLEPHRINE 40 MCG/ML (10ML) SYRINGE FOR IV PUSH (FOR BLOOD PRESSURE SUPPORT)
PREFILLED_SYRINGE | INTRAVENOUS | Status: AC
  Filled 2020-10-27: qty 10

## 2020-10-27 MED ORDER — FENTANYL CITRATE (PF) 250 MCG/5ML IJ SOLN
INTRAMUSCULAR | Status: AC
  Filled 2020-10-27: qty 5

## 2020-10-27 MED ORDER — LIDOCAINE-PRILOCAINE 2.5-2.5 % EX CREA: 1.0000 "application " | TOPICAL_CREAM | CUTANEOUS | Status: DC | PRN

## 2020-10-27 MED ORDER — LIDOCAINE HCL (CARDIAC) PF 100 MG/5ML IV SOSY
PREFILLED_SYRINGE | INTRAVENOUS | Status: DC | PRN
  Administered 2020-10-27: 60 mg via INTRAVENOUS

## 2020-10-27 MED ORDER — HEPARIN SODIUM (PORCINE) 1000 UNIT/ML IJ SOLN
INTRAMUSCULAR | Status: AC
  Administered 2020-10-27: 1000 [IU]
  Filled 2020-10-27: qty 3

## 2020-10-27 MED ORDER — METOPROLOL TARTRATE 5 MG/5ML IV SOLN: 2.5000 mg | Freq: Three times a day (TID) | INTRAVENOUS | Status: DC | PRN

## 2020-10-27 MED ORDER — FENTANYL CITRATE (PF) 100 MCG/2ML IJ SOLN
INTRAMUSCULAR | Status: DC | PRN
  Administered 2020-10-27 (×2): 50 ug via INTRAVENOUS

## 2020-10-27 MED ORDER — HEPARIN SODIUM (PORCINE) 1000 UNIT/ML DIALYSIS: 1000.0000 [IU] | INTRAMUSCULAR | Status: DC | PRN

## 2020-10-27 MED ORDER — SODIUM CHLORIDE 0.9 % IR SOLN
Status: DC | PRN
  Administered 2020-10-27: 1000 mL

## 2020-10-27 MED ORDER — ONDANSETRON HCL 4 MG/2ML IJ SOLN
INTRAMUSCULAR | Status: AC
  Filled 2020-10-27: qty 2

## 2020-10-27 MED ORDER — DEXAMETHASONE SODIUM PHOSPHATE 10 MG/ML IJ SOLN
INTRAMUSCULAR | Status: DC | PRN
  Administered 2020-10-27: 5 mg via INTRAVENOUS

## 2020-10-27 MED ORDER — PROCHLORPERAZINE EDISYLATE 10 MG/2ML IJ SOLN
10.0000 mg | Freq: Four times a day (QID) | INTRAMUSCULAR | Status: DC | PRN
  Administered 2020-11-02: 10 mg via INTRAVENOUS
  Filled 2020-10-27: qty 2

## 2020-10-27 MED ORDER — SENNOSIDES-DOCUSATE SODIUM 8.6-50 MG PO TABS
2.0000 | ORAL_TABLET | Freq: Every day | ORAL | Status: DC
  Administered 2020-10-28 – 2020-10-31 (×3): 2 via ORAL
  Filled 2020-10-27 (×5): qty 2

## 2020-10-27 SURGICAL SUPPLY — 49 items
BANDAGE ESMARK 6X9 LF (GAUZE/BANDAGES/DRESSINGS) IMPLANT
BNDG CMPR 9X6 STRL LF SNTH (GAUZE/BANDAGES/DRESSINGS) ×1
BNDG ELASTIC 4X5.8 VLCR STR LF (GAUZE/BANDAGES/DRESSINGS) ×1 IMPLANT
BNDG ELASTIC 6X5.8 VLCR STR LF (GAUZE/BANDAGES/DRESSINGS) ×1 IMPLANT
BNDG ESMARK 6X9 LF (GAUZE/BANDAGES/DRESSINGS) ×2
CANISTER SUCT 3000ML PPV (MISCELLANEOUS) ×2 IMPLANT
CLIP VESOCCLUDE MED 6/CT (CLIP) ×1 IMPLANT
CLIP VESOCCLUDE SM WIDE 6/CT (CLIP) ×1 IMPLANT
COVER WAND RF STERILE (DRAPES) ×2 IMPLANT
CUFF TOURN SGL QUICK 18X4 (TOURNIQUET CUFF) IMPLANT
CUFF TOURN SGL QUICK 24 (TOURNIQUET CUFF) ×2
CUFF TOURN SGL QUICK 34 (TOURNIQUET CUFF)
CUFF TOURN SGL QUICK 42 (TOURNIQUET CUFF) IMPLANT
CUFF TRNQT CYL 24X4X16.5-23 (TOURNIQUET CUFF) IMPLANT
CUFF TRNQT CYL 34X4.125X (TOURNIQUET CUFF) IMPLANT
DRAIN CHANNEL 15F RND FF W/TCR (WOUND CARE) IMPLANT
DRSG ADAPTIC 3X8 NADH LF (GAUZE/BANDAGES/DRESSINGS) ×1 IMPLANT
ELECT REM PT RETURN 9FT ADLT (ELECTROSURGICAL) ×2
ELECTRODE REM PT RTRN 9FT ADLT (ELECTROSURGICAL) ×1 IMPLANT
EVACUATOR SILICONE 100CC (DRAIN) IMPLANT
GLOVE BIO SURGEON STRL SZ7.5 (GLOVE) ×2 IMPLANT
GOWN STRL REUS W/ TWL LRG LVL3 (GOWN DISPOSABLE) ×1 IMPLANT
GOWN STRL REUS W/ TWL XL LVL3 (GOWN DISPOSABLE) ×3 IMPLANT
GOWN STRL REUS W/TWL LRG LVL3 (GOWN DISPOSABLE) ×2
GOWN STRL REUS W/TWL XL LVL3 (GOWN DISPOSABLE) ×6
KIT BASIN OR (CUSTOM PROCEDURE TRAY) ×2 IMPLANT
KIT TURNOVER KIT B (KITS) ×2 IMPLANT
NS IRRIG 1000ML POUR BTL (IV SOLUTION) ×4 IMPLANT
PACK CV ACCESS (CUSTOM PROCEDURE TRAY) IMPLANT
PACK GENERAL/GYN (CUSTOM PROCEDURE TRAY) IMPLANT
PACK PERIPHERAL VASCULAR (CUSTOM PROCEDURE TRAY) IMPLANT
PACK UNIVERSAL I (CUSTOM PROCEDURE TRAY) IMPLANT
PAD ABD 8X10 STRL (GAUZE/BANDAGES/DRESSINGS) ×1 IMPLANT
PAD ARMBOARD 7.5X6 YLW CONV (MISCELLANEOUS) ×4 IMPLANT
PAD CAST 4YDX4 CTTN HI CHSV (CAST SUPPLIES) IMPLANT
PADDING CAST COTTON 4X4 STRL (CAST SUPPLIES) ×2
SPONGE LAP 18X18 RF (DISPOSABLE) ×1 IMPLANT
STAPLER VISISTAT 35W (STAPLE) ×1 IMPLANT
SUT MNCRL AB 4-0 PS2 18 (SUTURE) IMPLANT
SUT PROLENE 5 0 C 1 24 (SUTURE) ×2 IMPLANT
SUT PROLENE 6 0 BV (SUTURE) IMPLANT
SUT VIC AB 2-0 CT1 27 (SUTURE)
SUT VIC AB 2-0 CT1 TAPERPNT 27 (SUTURE) IMPLANT
SUT VIC AB 3-0 SH 27 (SUTURE)
SUT VIC AB 3-0 SH 27X BRD (SUTURE) IMPLANT
TOWEL GREEN STERILE (TOWEL DISPOSABLE) ×2 IMPLANT
TRAY FOLEY MTR SLVR 16FR STAT (SET/KITS/TRAYS/PACK) IMPLANT
UNDERPAD 30X36 HEAVY ABSORB (UNDERPADS AND DIAPERS) ×2 IMPLANT
WATER STERILE IRR 1000ML POUR (IV SOLUTION) ×2 IMPLANT

## 2020-10-27 NOTE — Plan of Care (Signed)
  Problem: Education: Goal: Knowledge of General Education information will improve Description Including pain rating scale, medication(s)/side effects and non-pharmacologic comfort measures Outcome: Progressing   

## 2020-10-27 NOTE — ED Notes (Signed)
This RN spoke with IV team in regards to accessing dialysis port. IV says the only way they can access the port is if a renal doctor places an order saying it is okay to be access. MD made aware.

## 2020-10-27 NOTE — Progress Notes (Signed)
PROGRESS NOTE  [name] MPN:[id number] DOB: [birth year]   PCP: Lajean Manes, MD  Patient is from: Home.  Uses wheelchair at baseline.  DOA: 10/26/2020 LOS: 0  Chief complaints: Severe left arm pain and swelling  Brief Narrative / Interim history: 75 year old F with PMH of ESRD on HD TTS, aortic valve stenosis, paroxysmal A. fib on Eliquis, malfunctioning left arm aVF and debility presented to ED with progressive LUE pain and numbness due to bleeding from malfunctioning left arm aVF with hematoma, and admitted for compartment syndrome.    Patient underwent emergent evacuation of LUE hematoma and repair of left brachial artery pseudoaneurysm by Dr. Donzetta Matters with significant improvement in her pain and near resolution of hyperesthesia.   Subjective: Seen and examined earlier this morning after she returned from surgery.  Feels better.  Pain improved.  She rates her pain 4/10.  Numbness has almost gone.  She denies chest pain, dyspnea, dizziness or GI symptoms.  Hgb dropped to 6.4.  Recheck 6.3.  1 unit of PRBC ordered.  Objective: Vitals:   10/27/20 0731 10/27/20 0745 10/27/20 0819 10/27/20 1100  BP: (!) 130/53 (!) 137/57 (!) 125/55 (!) 134/56  Pulse: 72 74 (!) 174   Resp: 17 12 (!) 9   Temp:  (!) 97 F (36.1 C) (!) 97.5 F (36.4 C) 97.8 F (36.6 C)  TempSrc:   Oral Oral  SpO2: 93% 100% 96%   Weight:      Height:        Intake/Output Summary (Last 24 hours) at 10/27/2020 1224 Last data filed at 10/27/2020 0547 Gross per 24 hour  Intake 600 ml  Output 200 ml  Net 400 ml   Filed Weights   10/26/20 2256  Weight: 77.6 kg    Examination:  GENERAL: No apparent distress.  Nontoxic. HEENT: MMM.  Vision and hearing grossly intact.  NECK: Supple.  No apparent JVD.  RESP: On RA.  No IWOB.  Fair aeration bilaterally. CVS: 3/6 SEM over RUSB and LUSB.  ABD/GI/GU: BS+. Abd soft, NTND.  MSK/EXT:  Moves extremities.  Dressing and Ace wrap over LUE.  Neurovascular intact in  digits. SKIN: Dressing and Ace wrap over LUE. NEURO: Awake, alert and oriented appropriately.  No apparent focal neuro deficit. PSYCH: Calm. Normal affect.   Procedures:  5/10-evacuation of LUE hematoma and primary repair of left brachial artery pseudoaneurysm by Dr. Donzetta Matters  Microbiology summarized: COVID-19 and influenza PCR nonreactive. MRSA PCR screen negative.  Assessment & Plan: LUE compartment syndrome due to LUE hematoma and brachial artery pseudoaneurysm Malfunctioning LUE aVF -S/p hematoma evacuation and repair of aneurysm -Numbness almost gone.  Pain improved. -Pain control  ESRD on HD TTS-compliant with HD. -Nephrology consulted -She has HD cut on her right chest. -LUE aVF malfunction as above  Chronic diastolic CHF: TTE in 0/0867 with LVEF of 55 to 60%, G2 DD, moderate LAE, moderate RAE, moderate MVR and RVSP of 53 mmHg.  No respiratory distress. -Fluid management with dialysis.  Mild hyperbilirubinemia -Recheck in the morning  ABLA superimposed on ACD of ESRD Recent Labs    06/21/20 0618 06/22/20 0354 06/23/20 0540 06/23/20 1816 06/24/20 0148 07/17/20 1038 10/22/20 2350 10/26/20 2303 10/27/20 0854 10/27/20 1107  HGB 9.9* 9.0* 8.4* 8.5* 8.6* 8.8* 8.4* 8.5* 6.4* 6.3*  -Transfuse 1 unit -Recheck H&H -IV iron and EPO per nephrology  Paroxysmal A. fib on Eliquis -Resume Eliquis when okay from vascular surgery standpoint -Continue home amiodarone  Debility-wheelchair-bound at baseline. -PT/OT  Home medications-Home  medications has not been reconciled. -Requested pharmacy to reconcile home medication is up  Body mass index is 30.3 kg/m.         DVT prophylaxis:  SCD Resume Eliquis once okay from surgical standpoint  Code Status: Full code-confirmed with patient Family Communication: Updated patient's daughter at bedside. Level of care: Progressive Status is: Inpatient  Remains inpatient appropriate because:IV treatments appropriate  due to intensity of illness or inability to take PO and Inpatient level of care appropriate due to severity of illness   Dispo:  Patient From: Home  Planned Disposition: Home  Medically stable for discharge: No         Consultants:  Vascular surgery Nephrology   Sch Meds:  Scheduled Meds: . sodium chloride   Intravenous Once  . sodium chloride   Intravenous Once  . amiodarone  200 mg Oral Daily  . ondansetron  4 mg Oral Once  . senna-docusate  2 tablet Oral QHS  . timolol  1 drop Both Eyes Daily   Continuous Infusions: . albumin human     PRN Meds:.acetaminophen, HYDROmorphone (DILAUDID) injection, metoprolol tartrate, oxyCODONE, prochlorperazine  Antimicrobials: Anti-infectives (From admission, onward)   None       I have personally reviewed the following labs and images: CBC: Recent Labs  Lab 10/22/20 2350 10/26/20 2303 10/27/20 0854 10/27/20 1107  WBC 7.7 14.6* 11.3* 10.3  NEUTROABS 6.4  --   --   --   HGB 8.4* 8.5* 6.4* 6.3*  HCT 26.0* 27.5* 19.6* 19.8*  MCV 90.3 96.5 92.0 92.5  PLT 177 287 190 192   BMP &GFR Recent Labs  Lab 10/26/20 2303 10/27/20 0854  NA 136 136  K 3.5 3.6  CL 97* 97*  CO2 28 29  GLUCOSE 108* 109*  BUN 19 21  CREATININE 4.27* 4.53*  CALCIUM 8.5* 8.3*  MG  --  2.0  PHOS  --  3.6   Estimated Creatinine Clearance: 10.6 mL/min (A) (by C-G formula based on SCr of 4.53 mg/dL (H)). Liver & Pancreas: Recent Labs  Lab 10/26/20 2303 10/27/20 0854  AST 23 22  ALT 12 12  ALKPHOS 97 74  BILITOT 1.5* 1.5*  PROT 4.5* 4.6*  ALBUMIN 2.0* 2.6*   No results for input(s): LIPASE, AMYLASE in the last 168 hours. No results for input(s): AMMONIA in the last 168 hours. Diabetic: No results for input(s): HGBA1C in the last 72 hours. No results for input(s): GLUCAP in the last 168 hours. Cardiac Enzymes: No results for input(s): CKTOTAL, CKMB, CKMBINDEX, TROPONINI in the last 168 hours. No results for input(s): PROBNP in the last  8760 hours. Coagulation Profile: Recent Labs  Lab 10/27/20 0854  INR 1.8*   Thyroid Function Tests: No results for input(s): TSH, T4TOTAL, FREET4, T3FREE, THYROIDAB in the last 72 hours. Lipid Profile: No results for input(s): CHOL, HDL, LDLCALC, TRIG, CHOLHDL, LDLDIRECT in the last 72 hours. Anemia Panel: No results for input(s): VITAMINB12, FOLATE, FERRITIN, TIBC, IRON, RETICCTPCT in the last 72 hours. Urine analysis:    Component Value Date/Time   COLORURINE RED (A) 07/17/2020 1001   APPEARANCEUR TURBID (A) 07/17/2020 1001   LABSPEC  07/17/2020 1001    TEST NOT REPORTED DUE TO COLOR INTERFERENCE OF URINE PIGMENT   PHURINE  07/17/2020 1001    TEST NOT REPORTED DUE TO COLOR INTERFERENCE OF URINE PIGMENT   GLUCOSEU (A) 07/17/2020 1001    TEST NOT REPORTED DUE TO COLOR INTERFERENCE OF URINE PIGMENT   HGBUR (A) 07/17/2020  1001    TEST NOT REPORTED DUE TO COLOR INTERFERENCE OF URINE PIGMENT   BILIRUBINUR (A) 07/17/2020 1001    TEST NOT REPORTED DUE TO COLOR INTERFERENCE OF URINE PIGMENT   BILIRUBINUR neg 09/18/2014 0920   KETONESUR (A) 07/17/2020 1001    TEST NOT REPORTED DUE TO COLOR INTERFERENCE OF URINE PIGMENT   PROTEINUR (A) 07/17/2020 1001    TEST NOT REPORTED DUE TO COLOR INTERFERENCE OF URINE PIGMENT   UROBILINOGEN 0.2 05/02/2015 2020   NITRITE (A) 07/17/2020 1001    TEST NOT REPORTED DUE TO COLOR INTERFERENCE OF URINE PIGMENT   LEUKOCYTESUR (A) 07/17/2020 1001    TEST NOT REPORTED DUE TO COLOR INTERFERENCE OF URINE PIGMENT   Sepsis Labs: Invalid input(s): PROCALCITONIN, Gray  Microbiology: Recent Results (from the past 240 hour(s))  Resp Panel by RT-PCR (Flu A&B, Covid) Nasopharyngeal Swab     Status: None   Collection Time: 10/27/20  2:44 AM   Specimen: Nasopharyngeal Swab; Nasopharyngeal(NP) swabs in vial transport medium  Result Value Ref Range Status   SARS Coronavirus 2 by RT PCR NEGATIVE NEGATIVE Final    Comment: (NOTE) SARS-CoV-2 target nucleic  acids are NOT DETECTED.  The SARS-CoV-2 RNA is generally detectable in upper respiratory specimens during the acute phase of infection. The lowest concentration of SARS-CoV-2 viral copies this assay can detect is 138 copies/mL. A negative result does not preclude SARS-Cov-2 infection and should not be used as the sole basis for treatment or other patient management decisions. A negative result may occur with  improper specimen collection/handling, submission of specimen other than nasopharyngeal swab, presence of viral mutation(s) within the areas targeted by this assay, and inadequate number of viral copies(<138 copies/mL). A negative result must be combined with clinical observations, patient history, and epidemiological information. The expected result is Negative.  Fact Sheet for Patients:  EntrepreneurPulse.com.au  Fact Sheet for Healthcare Providers:  IncredibleEmployment.be  This test is no t yet approved or cleared by the [place] FDA and  has been authorized for detection and/or diagnosis of SARS-CoV-2 by FDA under an Emergency Use Authorization (EUA). This EUA will remain  in effect (meaning this test can be used) for the duration of the COVID-19 declaration under Section 564(b)(1) of the Act, 21 U.S.C.section 360bbb-3(b)(1), unless the authorization is terminated  or revoked sooner.       Influenza A by PCR NEGATIVE NEGATIVE Final   Influenza B by PCR NEGATIVE NEGATIVE Final    Comment: (NOTE) The Xpert Xpress SARS-CoV-2/FLU/RSV plus assay is intended as an aid in the diagnosis of influenza from Nasopharyngeal swab specimens and should not be used as a sole basis for treatment. Nasal washings and aspirates are unacceptable for Xpert Xpress SARS-CoV-2/FLU/RSV testing.  Fact Sheet for Patients: EntrepreneurPulse.com.au  Fact Sheet for Healthcare Providers: IncredibleEmployment.be  This  test is not yet approved or cleared by the [place] FDA and has been authorized for detection and/or diagnosis of SARS-CoV-2 by FDA under an Emergency Use Authorization (EUA). This EUA will remain in effect (meaning this test can be used) for the duration of the COVID-19 declaration under Section 564(b)(1) of the Act, 21 U.S.C. section 360bbb-3(b)(1), unless the authorization is terminated or revoked.  Performed at [facility], [place] [address] [place], [place] [address]   MRSA PCR Screening     Status: None   Collection Time: 10/27/20  8:22 AM   Specimen: Nasal Mucosa; Nasopharyngeal  Result Value Ref Range Status   MRSA by PCR  NEGATIVE NEGATIVE Final    Comment:        The GeneXpert MRSA Assay (FDA approved for NASAL specimens only), is one component of a comprehensive MRSA colonization surveillance program. It is not intended to diagnose MRSA infection nor to guide or monitor treatment for MRSA infections. Performed at [facility], [place] [address], [place], [place] [address]     Radiology Studies: CT Extrem Up Entire Arm L WO/CM  Result Date: 10/27/2020 CLINICAL DATA:  Rupture of arm fistula few days ago with interval increase in arm circumference. Marked edema and discoloration of the arm. Concern for compartment syndrome. Unable to insert IV. EXAM: CT OF THE UPPER LEFT EXTREMITY WITHOUT CONTRAST TECHNIQUE: Multidetector CT imaging of the upper left extremity was performed according to the standard protocol. COMPARISON:  None. FINDINGS: Bones/Joint/Cartilage No acute displaced fracture or dislocation. No cortical erosion or destruction. Partially visualized left hip arthroplasty. Ligaments Suboptimally assessed by CT. Muscles and Tendons Large hypo to isodense lesion within the biceps muscle extending at least 17 cm in the craniocaudal dimension and measuring up to approximately 6 x 6 cm on axial images (5:72, 11:15). Soft tissues Extensive subcutaneus soft  tissue edema of the entire left upper extremity as well as left chest with associated areas of dermal thickening. No subcutaneus soft tissue emphysema. Medial to the biceps muscle along the distal arm there is a fluid hypodense collection measuring approximately 4.5 x 2 cm on axial imaging that appears external to the musculature and is likely associated with the fistula. Unable to evaluate vasculature on this noncontrast study. Unable to evaluate known left upper extremity fistula. Anasarca noted. Other: Right chest wall central catheter with tip terminating at the superior cavoatrial junction. Coronary calcifications. Trace small volume left pleural effusion with associated passive atelectasis left lower lobe. Prominent cardiac size with suggestion of anemia given hyperdensity of the myocardium compared to the cardiac chambers. Large hiatal hernia. Atrophic kidneys. At least trace volume ascites. Colonic diverticulosis. Atherosclerotic plaque of the aorta. Question axillary lymphadenopathy on the left. IMPRESSION: 1. Finding likely represent interval development of a large intramuscular biceps hematoma (6 x 6 x 17 cm). Associated extensive left upper extremity edema. Underlying mass lesion/tumor not excluded. Compartment syndrome is probable in the setting. 2. Medial to the biceps muscle along the distal arm there is a fluid hypodense collection measuring approximately 4.5 x 2 cm that appears external to the muscle and is likely associated with the fistula. Markedly limited evaluation of a known fistula due to noncontrast study. 3. Left trace to small volume pleural effusion with associated atelectasis of the left lower lobe. 4. Question associated axillary lymphadenopathy on the left. 5. Other imaging findings of potential clinical significance: Large hiatal hernia. Volume overload with anasarca and at least trace volume ascites. Aortic Atherosclerosis (ICD10-I70.0). These results were called by telephone at the  time of interpretation on 10/27/2020 at 1:35 am to provider [facility] , who verbally acknowledged these results. Electronically Signed   By: Iven Finn M.D.   On: 10/27/2020 01:47   No charge service.  Admission after midnight.   Abbigale Mcelhaney T. Rossiter  If 7PM-7AM, please contact night-coverage [email or web address] 10/27/2020, 12:24 PM

## 2020-10-27 NOTE — Anesthesia Postprocedure Evaluation (Signed)
Anesthesia Post Note  Patient: [name]  Procedure(s) Performed: EVACUATION HEMATOMA, repair of Pseudoaneurysm (Left Arm Upper)     Patient location during evaluation: PACU Anesthesia Type: General Level of consciousness: sedated Pain management: pain level controlled Vital Signs Assessment: post-procedure vital signs reviewed and stable Respiratory status: spontaneous breathing and respiratory function stable Cardiovascular status: stable Postop Assessment: no apparent nausea or vomiting Anesthetic complications: no   No complications documented.                Kunaal Walkins DANIEL

## 2020-10-27 NOTE — ED Notes (Signed)
Consent signed and at bedside  

## 2020-10-27 NOTE — Transfer of Care (Signed)
Immediate Anesthesia Transfer of Care Note  Patient: [name]  Procedure(s) Performed: EVACUATION HEMATOMA, repair of Pseudoaneurysm (Left Arm Upper)  Patient Location: PACU  Anesthesia Type:General  Level of Consciousness: drowsy  Airway & Oxygen Therapy: Patient Spontanous Breathing and Patient connected to nasal cannula oxygen  Post-op Assessment: Report given to RN, Post -op Vital signs reviewed and stable and Patient moving all extremities  Post vital signs: Reviewed and stable  Last Vitals:  Vitals Value Taken Time  BP 120/65 10/27/20 0606  Temp    Pulse 76 10/27/20 0606  Resp 14 10/27/20 0607  SpO2 100 % 10/27/20 0606  Vitals shown include unvalidated device data.  Last Pain:  Vitals:   10/27/20 0320  TempSrc:   PainSc: 6          Complications: No complications documented.

## 2020-10-27 NOTE — Consult Note (Signed)
ED Consult    Reason for Consult:  Left arm hematoma Referring Physician:  Dr. Langston Masker MRN #:  [id number]  History of Present Illness: This is a 75 y.o. female with esrd and L 2nd stage bvt performed in 2017. She is on reduced dose Eliquis (2.5mg  bid) for paf followed by Dr. Irish Lack.  Last Thursday during dialysis she developed hematoma.  Catheter was placed on Friday.  She subsequently dialyzed via the catheter on Saturday.  States that yesterday the hematoma began worsening.  She developed numbness in her left hand that had been present but has been worsening since the beginning of day on Monday.  Currently rates her pain as severe on the medial aspect of her left upper arm.  She last had water about 7 PM Monday, May 9.  Past Medical History:  Diagnosis Date  . Arthritis of left knee   . Cardiomyopathy ([place])    a. h/o LV dysfunction EF 20-25% in 2013 due to sepsis.>> improved to normal   . Chronic diastolic CHF (congestive heart failure) ([place])    10/ 2013 in setting of septic shock  . Complication of anesthesia    use a little anesthesia , per patient MD states she quit breathing (2016); hard to wake up  . ESRD (end stage renal disease) ([place])    dialysis Tues, Thurs, Sat [address], sees dr deterding   . GERD (gastroesophageal reflux disease)   . Glaucoma    both eyes  . H/O hiatal hernia    a. CT 2017: large gastric hiatal hernia.  Marland Kitchen Headache(784.0)    migraine hx of  . History of blood transfusion 04/13/2015   . History of echocardiogram    a. Echo 6/17: EF 60-65%, normal wall motion, mild MR, atrial septal lipomatous hypertrophy, PASP 34 mmHg, possible trivial free-flowing pericardial effusion along RV free wall // b. Echo 5/17: Mild LVH, EF 55-60%, normal wall motion, grade 1 diastolic dysfunction, trivial MR, severe LAE, mild RAE, PASP 42 mmHg  . History of kidney stones    10/18/2019: per patient "has a couple currently one in each kidney"  . History of nephrostomy  04/11/2015   currently inplace 04/28/2015  removed now  . History of nuclear stress test    a. Myoview 1/14 - Marked ischemia in the basal anterior, mid anterior, apical septal and apical inferior regions, EF 63% >> LHC normal   . Hyperlipidemia   . Hypertension    medication removed from regimen due to low blood pressure   . Iron deficiency    hx  . Myocardial infarction [facility]) 2013   10/18/2019: per patient "in 2013)  . Nephrolithiasis 2002, 2006   bilateral  . Normal coronary arteries 2014   a. LHC in 1/14: normal coronary arteries  . PAF (paroxysmal atrial fibrillation) ([place])    a. 10/ 2013  in setting of Septic Shock //  b. recurrent during admit for pneumonia, L effusion >> placed on Amiodarone // Coumadin for anticoagulation  . Pneumonia jan 2018, last tme lungs clear now   dx 10-06-2014 per CXR--  on 10-27-2014 pt states finished antibiotic and denies cough or fever  . Primary localized osteoarthritis of right hip 10/22/2019  . S/P hemodialysis catheter insertion ([place]) 04/11/2015    right anterior chest , only used once   . Sigmoid diverticulosis   . SOB (shortness of breath) on exertion    10/18/2019: per patient "get short of breath with activity sometimes due to heart valve"  .  UTI (urinary tract infection) 05/10/2016    Past Surgical History:  Procedure Laterality Date  . AV FISTULA PLACEMENT Left 06/02/2015   Procedure: BRACHIOCEPHALIC ARTERIOVENOUS (AV) FISTULA CREATION ;  Surgeon: Conrad Dawson, MD;  Location: [place];  Service: Vascular;  Laterality: Left;  . BASCILIC VEIN TRANSPOSITION Left 07/27/2015   Procedure: FIRST STAGE BASILIC VEIN TRANSPOSITION LEFT UPPER ARM;  Surgeon: Conrad La Barge, MD;  Location: [place];  Service: Vascular;  Laterality: Left;  . BASCILIC VEIN TRANSPOSITION Left 09/2015   second phase  . BASCILIC VEIN TRANSPOSITION Left 10/12/2015   Procedure: SECOND STAGE BASILIC VEIN TRANSPOSITION LEFT ARM;  Surgeon: Conrad Refugio, MD;  Location: [place];  Service:  Vascular;  Laterality: Left;  . BREAST BIOPSY Left 08/23/07   benign fibrocystic with duct ectasia  . CARDIAC CATHETERIZATION  07-11-2012  dr Irish Lack   Abnormal stress test/   normal coronary arteries/  LVEDP  37mmHg  . CARDIOVASCULAR STRESS TEST  06-26-2012  dr Irish Lack   marked ischemia in the basal anterior, mid anterior, apical inferior regions/  normal LVF, ef 63%  . CATARACT EXTRACTION W/ INTRAOCULAR LENS  IMPLANT, BILATERAL    . COLONOSCOPY WITH PROPOFOL N/A 10/17/2016   Procedure: COLONOSCOPY WITH PROPOFOL;  Surgeon: Garlan Fair, MD;  Location: [facility];  Service: Endoscopy;  Laterality: N/A;  . CYSTOSCOPY W/ URETERAL STENT PLACEMENT  04/04/2012   Procedure: CYSTOSCOPY WITH RETROGRADE PYELOGRAM/URETERAL STENT PLACEMENT;  Surgeon: Ailene Rud, MD;  Location: [place];  Service: Urology;  Laterality: Left;  . CYSTOSCOPY W/ URETERAL STENT PLACEMENT Bilateral 05/04/2015   Procedure: CYSTOSCOPY WITH BILATERAL RETROGRADE PYELOGRAM/ WITH INTERPRETATION, EXCHANGE OF RIGHT URETERAL STENT REPLACEMENT AND PLACEMENT LEFT URETERAL STENT PLACEMENT EXAMINATION OF VAGINA;  Surgeon: Carolan Clines, MD;  Location: [facility];  Service: Urology;  Laterality: Bilateral;  . CYSTOSCOPY WITH STENT PLACEMENT Right 10/28/2014   Procedure: RIGHT URETERAL STENT PLACEMENT;  Surgeon: Irine Seal, MD;  Location: [facility];  Service: Urology;  Laterality: Right;  . CYSTOSCOPY WITH STENT PLACEMENT Right 02/26/2015   Procedure: CYSTOSCOPY RETROGRADE PYELOGRAM WITH STENT PLACEMENT;  Surgeon: Cleon Gustin, MD;  Location: [facility];  Service: Urology;  Laterality: Right;  . CYSTOSCOPY/RETROGRADE/URETEROSCOPY/STONE EXTRACTION WITH BASKET Right 11/21/2014   Procedure: CYSTOSCOPY/RIGHT RETROGRADE PYELOGRAM/RIGHT URETEROSCOPY/BASKET EXTRACTION/RIGHT PYELOSCOPY/LASER OF STONE/RIGHT DOUBLE J STENT;  Surgeon: Carolan Clines, MD;  Location: [place];  Service: Urology;  Laterality:  Right;  . ESOPHAGOGASTRODUODENOSCOPY (EGD) WITH PROPOFOL Left 08/25/2018   Procedure: ESOPHAGOGASTRODUODENOSCOPY (EGD) WITH PROPOFOL;  Surgeon: Ronald Lobo, MD;  Location: [place];  Service: Endoscopy;  Laterality: Left;  . EXTRACORPOREAL SHOCK WAVE LITHOTRIPSY  05-28-2012  &  10-08-2012  . HIP ARTHROPLASTY Left 06/09/2020   Procedure: ARTHROPLASTY BIPOLAR HIP (HEMIARTHROPLASTY);  Surgeon: Marchia Bond, MD;  Location: [place];  Service: Orthopedics;  Laterality: Left;  . HOLMIUM LASER APPLICATION Right 01/21/6961   Procedure: HOLMIUM LASER APPLICATION;  Surgeon: Carolan Clines, MD;  Location: [facility];  Service: Urology;  Laterality: Right;  . IR GENERIC HISTORICAL  02/01/2016   IR NEPHROSTOMY EXCHANGE RIGHT 02/01/2016 Greggory Keen, MD [facility]  . IR GENERIC HISTORICAL  02/24/2016   IR PATIENT EVAL TECH 0-60 MINS 02/24/2016 Aletta Edouard, MD [facility]  . KNEE ARTHROSCOPY Left 02-14-2003  . LAPAROSCOPIC CHOLECYSTECTOMY  03-23-2005  . TOTAL ABDOMINAL HYSTERECTOMY W/ BILATERAL SALPINGOOPHORECTOMY  1993   secondary to fibroids  . TOTAL KNEE ARTHROPLASTY Left 10/22/2019  . TOTAL KNEE ARTHROPLASTY Left 10/22/2019  Procedure: TOTAL KNEE ARTHROPLASTY;  Surgeon: Marchia Bond, MD;  Location: [place];  Service: Orthopedics;  Laterality: Left;  . TRANSTHORACIC ECHOCARDIOGRAM  04-09-2012   normal LVF,  ef 60-65%,  mild LAE,  mild TR, trivial MR and PR    Allergies  Allergen Reactions  . Astemizole Nausea And Vomiting  . Fluorouracil Rash  . Percocet [Oxycodone-Acetaminophen] Nausea And Vomiting  . Vicodin [Hydrocodone-Acetaminophen] Nausea And Vomiting  . Vicodin [Hydrocodone-Acetaminophen] Nausea And Vomiting  . Chlorhexidine Rash    Sunburn    rash  . Chlorhexidine Rash    Pt states she gets rash similar to "sunburn"  . Percocet [Oxycodone-Acetaminophen] Nausea And Vomiting    Prior to Admission medications   Medication Sig Start Date End Date Taking?  Authorizing Provider  acetaminophen (TYLENOL) 325 MG tablet Take 2 tablets (650 mg total) by mouth every 6 (six) hours as needed for mild pain, fever or headache. 06/24/20   Alma Friendly, MD  amiodarone (PACERONE) 200 MG tablet Take 1 tablet (200 mg total) by mouth daily. 08/24/20   Jettie Booze, MD  apixaban (ELIQUIS) 2.5 MG TABS tablet Take 1 tablet (2.5 mg total) by mouth 2 (two) times daily. 09/14/20   Jettie Booze, MD  bimatoprost (LUMIGAN) 0.01 % SOLN Place 1 drop into both eyes at bedtime.    [provider]  bimatoprost (LUMIGAN) 0.01 % SOLN Place 1 drop into both eyes at bedtime.    [provider]  calcium acetate (PHOSLO) 667 MG capsule Take 1,334 mg by mouth 3 (three) times daily with meals.     [provider]  calcium acetate (PHOSLO) 667 MG capsule Take 1 capsule (667 mg total) by mouth 3 (three) times daily with meals. 06/24/20   Alma Friendly, MD  cephALEXin (KEFLEX) 250 MG capsule Take 250 mg by mouth daily. 05/24/20   [provider]  cephALEXin (KEFLEX) 500 MG capsule Take 1 capsule (500 mg total) by mouth 4 (four) times daily. 07/17/20   Deno Etienne, DO  diphenhydrAMINE (BENADRYL) 25 MG tablet Take 1 tablet (25 mg total) by mouth every 8 (eight) hours as needed. 06/24/20   Alma Friendly, MD  lidocaine-prilocaine (EMLA) cream Apply 1 application topically 3 (three) times a week.  09/09/19   [provider]  lidocaine-prilocaine (EMLA) cream Apply 1 application topically 3 (three) times a week. 05/23/20   [provider]  loperamide (IMODIUM A-D) 2 MG tablet Take 4-6 mg by mouth 4 (four) times daily as needed for diarrhea or loose stools.    [provider]  loperamide (IMODIUM A-D) 2 MG tablet Take 2 mg by mouth 4 (four) times daily as needed for diarrhea or loose stools.    [provider]  multivitamin (RENA-VIT) TABS tablet Take 1 tablet by mouth at bedtime. 07/27/19   [provider]  multivitamin (RENA-VIT) TABS tablet Take 1 tablet by mouth daily. 05/11/20   [provider]  Nutritional Supplements (FEEDING SUPPLEMENT, NEPRO CARB STEADY,) LIQD Take 237 mLs by mouth 2 (two) times daily between meals. 06/24/20   Alma Friendly, MD  ondansetron (ZOFRAN) 4 MG tablet Take 4 mg by mouth every 8 (eight) hours as needed for nausea or vomiting.    [provider]  oxyCODONE (ROXICODONE) 5 MG immediate release tablet Take 1 tablet (5 mg total) by mouth every 4 (four) hours as needed for severe pain. 10/24/19   Ventura Bruns, PA-C  saccharomyces boulardii (FLORASTOR) 250  MG capsule Take 1 capsule (250 mg total) by mouth 2 (two) times daily. 07/08/15   Donne Hazel, MD  saccharomyces boulardii (FLORASTOR) 250 MG capsule Take 250 mg by mouth daily.    [provider]  sodium chloride (OCEAN) 0.65 % SOLN nasal spray Place 1 spray into both nostrils 3 (three) times daily as needed for congestion.    [provider]  timolol (BETIMOL) 0.5 % ophthalmic solution Place 1 drop into both eyes every morning.     [provider]  timolol (BETIMOL) 0.5 % ophthalmic solution Place 1 drop into both eyes daily.    [provider]    Social History   Socioeconomic History  . Marital status: Widowed    Spouse name: Not on file  . Number of children: 1  . Years of education: Not on file  . Highest education level: Not on file  Occupational History  . Occupation: retired  . Occupation: Herbalist  Tobacco Use  . Smoking status: Never Smoker  . Smokeless tobacco: Never Used  Vaping Use  . Vaping Use: Never used  Substance and Sexual Activity  . Alcohol use: No    Alcohol/week: 0.0 standard drinks  . Drug use: No  . Sexual activity: Not Currently    Birth control/protection: Post-menopausal, Surgical    Comment: widow husband passed 5/05 with lung cancer  Other Topics Concern  . Not on file  Social History  Narrative   ** Merged History Encounter **       Social Determinants of Health   Financial Resource Strain: Not on file  Food Insecurity: Not on file  Transportation Needs: Not on file  Physical Activity: Not on file  Stress: Not on file  Social Connections: Not on file  Intimate Partner Violence: Not on file     Family History  Problem Relation Age of Onset  . Hypertension Mother   . Cancer Mother 13       breast  . Dementia Mother   . Hypertension Brother   . Diabetes Brother   . Heart disease Brother        before age 67  . Cancer Father 59       pancreatic  . Heart failure Paternal Grandmother   . Bladder Cancer Maternal Grandfather     ROS: Cardiovascular: []  chest pain/pressure []  palpitations []  SOB lying flat []  DOE []  pain in legs while walking []  pain in legs at rest []  pain in legs at night []  non-healing ulcers []  hx of DVT [x]  swelling in arm  Pulmonary: []  productive cough []  asthma/wheezing []  home O2  Neurologic: []  weakness in []  arms []  legs []  numbness in []  arms []  legs []  hx of CVA []  mini stroke [] difficulty speaking or slurred speech []  temporary loss of vision in one eye []  dizziness  Hematologic: []  hx of cancer []  bleeding problems []  problems with blood clotting easily  Endocrine:   []  diabetes []  thyroid disease  GI []  vomiting blood []  blood in stool  GU: []  CKD/renal failure []  HD--[]  M/W/F or []  T/T/S []  burning with urination []  blood in urine  Psychiatric: []  anxiety []  depression  Musculoskeletal: []  arthritis []  joint pain  Integumentary: []  rashes []  ulcers  Constitutional: []  fever []  chills   Physical Examination  Vitals:   10/27/20 0100 10/27/20 0115  BP: (!) 161/79 (!) 174/75  Pulse: 71 72  Resp: 12 12  Temp:    SpO2: 98%  100%   Body mass index is 30.3 kg/m.  General:  nad HENT: WNL, normocephalic Pulmonary: normal non-labored breathing Cardiac: Palpable left radial  pulse Abdomen:  soft, NT/ND, no masses Extremities: Significant edema of the entire left upper extremity, the medial aspect of the left upper arm is very tense and exquisitely tender to palpation There is swelling of the left forearm and left hand Neurologic: A&O X 3; numbness of the hand Motor of the left hand is intact and she can bend the left elbow   CBC    Component Value Date/Time   WBC 14.6 (H) 10/26/2020 2303   RBC 2.85 (L) 10/26/2020 2303   HGB 8.5 (L) 10/26/2020 2303   HGB 13.6 09/16/2013 1129   HCT 27.5 (L) 10/26/2020 2303   HCT 23.6 (L) 04/08/2015 0545   PLT 287 10/26/2020 2303   MCV 96.5 10/26/2020 2303   MCH 29.8 10/26/2020 2303   MCHC 30.9 10/26/2020 2303   RDW 21.5 (H) 10/26/2020 2303   LYMPHSABS 0.5 (L) 10/22/2020 2350   MONOABS 0.6 10/22/2020 2350   EOSABS 0.1 10/22/2020 2350   BASOSABS 0.1 10/22/2020 2350    BMET    Component Value Date/Time   NA 136 10/26/2020 2303   K 3.5 10/26/2020 2303   CL 97 (L) 10/26/2020 2303   CO2 28 10/26/2020 2303   GLUCOSE 108 (H) 10/26/2020 2303   BUN 19 10/26/2020 2303   CREATININE 4.27 (H) 10/26/2020 2303   CALCIUM 8.5 (L) 10/26/2020 2303   CALCIUM 8.8 11/07/2015 1107   GFRNONAA 10 (L) 10/26/2020 2303   GFRAA 8 (L) 11/13/2019 1744    COAGS: Lab Results  Component Value Date   INR 6.3 (HH) 07/17/2020   INR 2.6 (H) 06/24/2020   INR >10.0 (HH) 06/24/2020     Non-Invasive Vascular Imaging:   CT left upper extremity  IMPRESSION: 1. Finding likely represent interval development of a large intramuscular biceps hematoma (6 x 6 x 17 cm). Associated extensive left upper extremity edema. Underlying mass lesion/tumor not excluded. Compartment syndrome is probable in the setting. 2. Medial to the biceps muscle along the distal arm there is a fluid hypodense collection measuring approximately 4.5 x 2 cm that appears external to the muscle and is likely associated with the fistula. Markedly limited evaluation of a  known fistula due to noncontrast study. 3. Left trace to small volume pleural effusion with associated atelectasis of the left lower lobe. 4. Question associated axillary lymphadenopathy on the left. 5. Other imaging findings of potential clinical significance: Large hiatal hernia. Volume overload with anasarca and at least trace volume ascites. Aortic Atherosclerosis (ICD10-I70.0).   ASSESSMENT/PLAN: This is a 75 y.o. 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  Patient does have exquisite tenderness to palpation and with any movement of the left arm or shoulder.  I have recommended hematoma evacuation.  I discussed with the patient that the nerve involvement could be permanent at this time leaving her with permanent numbness of her left hand.  I have also discussed that surgery may risk sacrificing the fistula.  She is at high bleeding risk having taken 2.5 mg of Eliquis at 7 PM tonight.  Patient and her daughter demonstrate very good understanding.  We will proceed urgently to the operating room pending COVID test.   Reni Hausner C. Donzetta Matters, MD [facility] [place] Office: [phone] Pager: [phone]

## 2020-10-27 NOTE — H&P (Signed)
History and Physical  KARALINA TIFT FWY:[id number] DOB: January 18, 1946 DOA: 10/26/2020  Referring physician: Dr. Langston Masker, [place] PCP: Lajean Manes, MD  Outpatient Specialists: Nephrology Patient coming from: Home.  Chief Complaint: Severe left arm pain and swelling  HPI: [name] is a 75 y.o. female with medical history significant for ESRD HD TTS, aortic valve stenosis paroxysmal A. fib on Eliquis, malfunctioning left-sided fistula who presented to [facility] ED due to progressively worsening severe left upper extremity pain with hematoma and now numbness.  Onset of pain and edema on Thursday 6 days ago at hemodialysis "after the needle was taken out" from her fistula site.  On the day of presentation the pain was so severe that it was taking her breath away.  She had some associative dyspnea at rest but denies chest pain.  States she felt like her left arm was going to explode, pain all the way up to her left shoulder, now with numbness to her fingers.  EMS was activated.  She was brought to the ED for further evaluation and management.  CT left upper extremity without contrast showed findings with concern for compartment syndrome.  EDP consulted vascular surgery with plan to take to the OR on 10/27/2020.  TRH, hospitalist team, was asked to admit.  ED Course:  Afebrile.  BP 146/79, pulse 70, respiratory rate 10, O2 saturation 100% on room air.  Lab studies remarkable for WBC 14.6, hemoglobin 8.5, MCV 96, platelet count 287.  Total bilirubin 1.5.  Review of Systems: Review of systems as noted in the HPI. All other systems reviewed and are negative.   Past Medical History:  Diagnosis Date  . Arthritis of left knee   . Cardiomyopathy ([place])    a. h/o LV dysfunction EF 20-25% in 2013 due to sepsis.>> improved to normal   . Chronic diastolic CHF (congestive heart failure) ([place])    10/ 2013 in setting of septic shock  . Complication of anesthesia    use a little anesthesia , per patient MD states she  quit breathing (2016); hard to wake up  . ESRD (end stage renal disease) ([place])    dialysis Tues, Thurs, Sat [address], sees dr deterding   . GERD (gastroesophageal reflux disease)   . Glaucoma    both eyes  . H/O hiatal hernia    a. CT 2017: large gastric hiatal hernia.  Marland Kitchen Headache(784.0)    migraine hx of  . History of blood transfusion 04/13/2015   . History of echocardiogram    a. Echo 6/17: EF 60-65%, normal wall motion, mild MR, atrial septal lipomatous hypertrophy, PASP 34 mmHg, possible trivial free-flowing pericardial effusion along RV free wall // b. Echo 5/17: Mild LVH, EF 55-60%, normal wall motion, grade 1 diastolic dysfunction, trivial MR, severe LAE, mild RAE, PASP 42 mmHg  . History of kidney stones    10/18/2019: per patient "has a couple currently one in each kidney"  . History of nephrostomy 04/11/2015   currently inplace 04/28/2015  removed now  . History of nuclear stress test    a. Myoview 1/14 - Marked ischemia in the basal anterior, mid anterior, apical septal and apical inferior regions, EF 63% >> LHC normal   . Hyperlipidemia   . Hypertension    medication removed from regimen due to low blood pressure   . Iron deficiency    hx  . Myocardial infarction [facility]) 2013   10/18/2019: per patient "in 2013)  . Nephrolithiasis 2002, 2006   bilateral  .  Normal coronary arteries 2014   a. LHC in 1/14: normal coronary arteries  . PAF (paroxysmal atrial fibrillation) ([place])    a. 10/ 2013  in setting of Septic Shock //  b. recurrent during admit for pneumonia, L effusion >> placed on Amiodarone // Coumadin for anticoagulation  . Pneumonia jan 2018, last tme lungs clear now   dx 10-06-2014 per CXR--  on 10-27-2014 pt states finished antibiotic and denies cough or fever  . Primary localized osteoarthritis of right hip 10/22/2019  . S/P hemodialysis catheter insertion ([place]) 04/11/2015    right anterior chest , only used once   . Sigmoid diverticulosis   . SOB (shortness of  breath) on exertion    10/18/2019: per patient "get short of breath with activity sometimes due to heart valve"  . UTI (urinary tract infection) 05/10/2016   Past Surgical History:  Procedure Laterality Date  . AV FISTULA PLACEMENT Left 06/02/2015   Procedure: BRACHIOCEPHALIC ARTERIOVENOUS (AV) FISTULA CREATION ;  Surgeon: Conrad Rice, MD;  Location: [place];  Service: Vascular;  Laterality: Left;  . BASCILIC VEIN TRANSPOSITION Left 07/27/2015   Procedure: FIRST STAGE BASILIC VEIN TRANSPOSITION LEFT UPPER ARM;  Surgeon: Conrad Georgetown, MD;  Location: [place];  Service: Vascular;  Laterality: Left;  . BASCILIC VEIN TRANSPOSITION Left 09/2015   second phase  . BASCILIC VEIN TRANSPOSITION Left 10/12/2015   Procedure: SECOND STAGE BASILIC VEIN TRANSPOSITION LEFT ARM;  Surgeon: Conrad Coldspring, MD;  Location: [place];  Service: Vascular;  Laterality: Left;  . BREAST BIOPSY Left 08/23/07   benign fibrocystic with duct ectasia  . CARDIAC CATHETERIZATION  07-11-2012  dr Irish Lack   Abnormal stress test/   normal coronary arteries/  LVEDP  39mmHg  . CARDIOVASCULAR STRESS TEST  06-26-2012  dr Irish Lack   marked ischemia in the basal anterior, mid anterior, apical inferior regions/  normal LVF, ef 63%  . CATARACT EXTRACTION W/ INTRAOCULAR LENS  IMPLANT, BILATERAL    . COLONOSCOPY WITH PROPOFOL N/A 10/17/2016   Procedure: COLONOSCOPY WITH PROPOFOL;  Surgeon: Garlan Fair, MD;  Location: [facility];  Service: Endoscopy;  Laterality: N/A;  . CYSTOSCOPY W/ URETERAL STENT PLACEMENT  04/04/2012   Procedure: CYSTOSCOPY WITH RETROGRADE PYELOGRAM/URETERAL STENT PLACEMENT;  Surgeon: Ailene Rud, MD;  Location: [place];  Service: Urology;  Laterality: Left;  . CYSTOSCOPY W/ URETERAL STENT PLACEMENT Bilateral 05/04/2015   Procedure: CYSTOSCOPY WITH BILATERAL RETROGRADE PYELOGRAM/ WITH INTERPRETATION, EXCHANGE OF RIGHT URETERAL STENT REPLACEMENT AND PLACEMENT LEFT URETERAL STENT PLACEMENT EXAMINATION OF VAGINA;  Surgeon:  Carolan Clines, MD;  Location: [facility];  Service: Urology;  Laterality: Bilateral;  . CYSTOSCOPY WITH STENT PLACEMENT Right 10/28/2014   Procedure: RIGHT URETERAL STENT PLACEMENT;  Surgeon: Irine Seal, MD;  Location: [facility];  Service: Urology;  Laterality: Right;  . CYSTOSCOPY WITH STENT PLACEMENT Right 02/26/2015   Procedure: CYSTOSCOPY RETROGRADE PYELOGRAM WITH STENT PLACEMENT;  Surgeon: Cleon Gustin, MD;  Location: [facility];  Service: Urology;  Laterality: Right;  . CYSTOSCOPY/RETROGRADE/URETEROSCOPY/STONE EXTRACTION WITH BASKET Right 11/21/2014   Procedure: CYSTOSCOPY/RIGHT RETROGRADE PYELOGRAM/RIGHT URETEROSCOPY/BASKET EXTRACTION/RIGHT PYELOSCOPY/LASER OF STONE/RIGHT DOUBLE J STENT;  Surgeon: Carolan Clines, MD;  Location: [place];  Service: Urology;  Laterality: Right;  . ESOPHAGOGASTRODUODENOSCOPY (EGD) WITH PROPOFOL Left 08/25/2018   Procedure: ESOPHAGOGASTRODUODENOSCOPY (EGD) WITH PROPOFOL;  Surgeon: Ronald Lobo, MD;  Location: [place];  Service: Endoscopy;  Laterality: Left;  . EXTRACORPOREAL SHOCK WAVE LITHOTRIPSY  05-28-2012  &  10-08-2012  .  HIP ARTHROPLASTY Left 06/09/2020   Procedure: ARTHROPLASTY BIPOLAR HIP (HEMIARTHROPLASTY);  Surgeon: Marchia Bond, MD;  Location: [place];  Service: Orthopedics;  Laterality: Left;  . HOLMIUM LASER APPLICATION Right 01/20/5052   Procedure: HOLMIUM LASER APPLICATION;  Surgeon: Carolan Clines, MD;  Location: [facility];  Service: Urology;  Laterality: Right;  . IR GENERIC HISTORICAL  02/01/2016   IR NEPHROSTOMY EXCHANGE RIGHT 02/01/2016 Greggory Keen, MD [facility]  . IR GENERIC HISTORICAL  02/24/2016   IR PATIENT EVAL TECH 0-60 MINS 02/24/2016 Aletta Edouard, MD [facility]  . KNEE ARTHROSCOPY Left 02-14-2003  . LAPAROSCOPIC CHOLECYSTECTOMY  03-23-2005  . TOTAL ABDOMINAL HYSTERECTOMY W/ BILATERAL SALPINGOOPHORECTOMY  1993   secondary to fibroids  . TOTAL KNEE ARTHROPLASTY Left  10/22/2019  . TOTAL KNEE ARTHROPLASTY Left 10/22/2019   Procedure: TOTAL KNEE ARTHROPLASTY;  Surgeon: Marchia Bond, MD;  Location: [place];  Service: Orthopedics;  Laterality: Left;  . TRANSTHORACIC ECHOCARDIOGRAM  04-09-2012   normal LVF,  ef 60-65%,  mild LAE,  mild TR, trivial MR and PR    Social History:  reports that she has never smoked. She has never used smokeless tobacco. She reports that she does not drink alcohol and does not use drugs.   Allergies  Allergen Reactions  . Astemizole Nausea And Vomiting  . Fluorouracil Rash  . Percocet [Oxycodone-Acetaminophen] Nausea And Vomiting  . Vicodin [Hydrocodone-Acetaminophen] Nausea And Vomiting  . Vicodin [Hydrocodone-Acetaminophen] Nausea And Vomiting  . Chlorhexidine Rash    Sunburn    rash  . Chlorhexidine Rash    Pt states she gets rash similar to "sunburn"  . Percocet [Oxycodone-Acetaminophen] Nausea And Vomiting    Family History  Problem Relation Age of Onset  . Hypertension Mother   . Cancer Mother 22       breast  . Dementia Mother   . Hypertension Brother   . Diabetes Brother   . Heart disease Brother        before age 59  . Cancer Father 37       pancreatic  . Heart failure Paternal Grandmother   . Bladder Cancer Maternal Grandfather       Prior to Admission medications   Medication Sig Start Date End Date Taking? Authorizing Provider  acetaminophen (TYLENOL) 325 MG tablet Take 2 tablets (650 mg total) by mouth every 6 (six) hours as needed for mild pain, fever or headache. 06/24/20   Alma Friendly, MD  amiodarone (PACERONE) 200 MG tablet Take 1 tablet (200 mg total) by mouth daily. 08/24/20   Jettie Booze, MD  apixaban (ELIQUIS) 2.5 MG TABS tablet Take 1 tablet (2.5 mg total) by mouth 2 (two) times daily. 09/14/20   Jettie Booze, MD  bimatoprost (LUMIGAN) 0.01 % SOLN Place 1 drop into both eyes at bedtime.    [provider]  bimatoprost (LUMIGAN) 0.01 % SOLN Place 1 drop into  both eyes at bedtime.    [provider]  calcium acetate (PHOSLO) 667 MG capsule Take 1,334 mg by mouth 3 (three) times daily with meals.     [provider]  calcium acetate (PHOSLO) 667 MG capsule Take 1 capsule (667 mg total) by mouth 3 (three) times daily with meals. 06/24/20   Alma Friendly, MD  cephALEXin (KEFLEX) 250 MG capsule Take 250 mg by mouth daily. 05/24/20   [provider]  cephALEXin (KEFLEX) 500 MG capsule Take 1 capsule (500 mg total) by mouth 4 (four) times daily. 07/17/20  Deno Etienne, DO  diphenhydrAMINE (BENADRYL) 25 MG tablet Take 1 tablet (25 mg total) by mouth every 8 (eight) hours as needed. 06/24/20   Alma Friendly, MD  lidocaine-prilocaine (EMLA) cream Apply 1 application topically 3 (three) times a week.  09/09/19   [provider]  lidocaine-prilocaine (EMLA) cream Apply 1 application topically 3 (three) times a week. 05/23/20   [provider]  loperamide (IMODIUM A-D) 2 MG tablet Take 4-6 mg by mouth 4 (four) times daily as needed for diarrhea or loose stools.    [provider]  loperamide (IMODIUM A-D) 2 MG tablet Take 2 mg by mouth 4 (four) times daily as needed for diarrhea or loose stools.    [provider]  multivitamin (RENA-VIT) TABS tablet Take 1 tablet by mouth at bedtime. 07/27/19   [provider]  multivitamin (RENA-VIT) TABS tablet Take 1 tablet by mouth daily. 05/11/20   [provider]  Nutritional Supplements (FEEDING SUPPLEMENT, NEPRO CARB STEADY,) LIQD Take 237 mLs by mouth 2 (two) times daily between meals. 06/24/20   Alma Friendly, MD  ondansetron (ZOFRAN) 4 MG tablet Take 4 mg by mouth every 8 (eight) hours as needed for nausea or vomiting.    [provider]  oxyCODONE (ROXICODONE) 5 MG immediate release tablet Take 1 tablet (5 mg total) by mouth every 4 (four) hours as needed for severe pain. 10/24/19   Ventura Bruns, PA-C  saccharomyces  boulardii (FLORASTOR) 250 MG capsule Take 1 capsule (250 mg total) by mouth 2 (two) times daily. 07/08/15   Donne Hazel, MD  saccharomyces boulardii (FLORASTOR) 250 MG capsule Take 250 mg by mouth daily.    [provider]  sodium chloride (OCEAN) 0.65 % SOLN nasal spray Place 1 spray into both nostrils 3 (three) times daily as needed for congestion.    [provider]  timolol (BETIMOL) 0.5 % ophthalmic solution Place 1 drop into both eyes every morning.     [provider]  timolol (BETIMOL) 0.5 % ophthalmic solution Place 1 drop into both eyes daily.    [provider]    Physical Exam: BP (!) 164/83   Pulse 70   Temp 97.8 F (36.6 C) (Oral)   Resp 15   Ht 5\' 3"  (1.6 m)   Wt 77.6 kg   LMP 01/19/1992 (Approximate)   SpO2 98%   BMI 30.30 kg/m   . General: 75 y.o. year-old female well developed well nourished in no acute distress.  Alert and oriented x3. . Cardiovascular: Regular rate and rhythm with no rubs or gallops.  No thyromegaly or JVD noted.  No lower extremity edema. 2/4 pulses in all 4 extremities. Marland Kitchen Respiratory: Clear to auscultation with no wheezes or rales. Good inspiratory effort. . Abdomen: Soft nontender nondistended with normal bowel sounds x4 quadrants. . Muskuloskeletal: Left upper extremity is severely edematous with bruising and likely hematoma.   . Neuro: CN II-XII intact, strength, sensation, reflexes . Skin: No ulcerative lesions noted or rashes.  Bruising noted in left upper extremity antecubital. . Psychiatry: Judgement and insight appear normal. Mood is appropriate for condition and setting          Labs on Admission:  Basic Metabolic Panel: Recent Labs  Lab 10/26/20 2303  NA 136  K 3.5  CL 97*  CO2 28  GLUCOSE 108*  BUN 19  CREATININE 4.27*  CALCIUM 8.5*   Liver Function Tests: Recent Labs  Lab 10/26/20 2303  AST  23  ALT 12  ALKPHOS 97  BILITOT 1.5*  PROT 4.5*  ALBUMIN 2.0*   No results for  input(s): LIPASE, AMYLASE in the last 168 hours. No results for input(s): AMMONIA in the last 168 hours. CBC: Recent Labs  Lab 10/22/20 2350 10/26/20 2303  WBC 7.7 14.6*  NEUTROABS 6.4  --   HGB 8.4* 8.5*  HCT 26.0* 27.5*  MCV 90.3 96.5  PLT 177 287   Cardiac Enzymes: No results for input(s): CKTOTAL, CKMB, CKMBINDEX, TROPONINI in the last 168 hours.  BNP (last 3 results) Recent Labs    06/08/20 0525  BNP 2,447.3*    ProBNP (last 3 results) No results for input(s): PROBNP in the last 8760 hours.  CBG: No results for input(s): GLUCAP in the last 168 hours.  Radiological Exams on Admission: CT Extrem Up Entire Arm L WO/CM  Result Date: 10/27/2020 CLINICAL DATA:  Rupture of arm fistula few days ago with interval increase in arm circumference. Marked edema and discoloration of the arm. Concern for compartment syndrome. Unable to insert IV. EXAM: CT OF THE UPPER LEFT EXTREMITY WITHOUT CONTRAST TECHNIQUE: Multidetector CT imaging of the upper left extremity was performed according to the standard protocol. COMPARISON:  None. FINDINGS: Bones/Joint/Cartilage No acute displaced fracture or dislocation. No cortical erosion or destruction. Partially visualized left hip arthroplasty. Ligaments Suboptimally assessed by CT. Muscles and Tendons Large hypo to isodense lesion within the biceps muscle extending at least 17 cm in the craniocaudal dimension and measuring up to approximately 6 x 6 cm on axial images (5:72, 11:15). Soft tissues Extensive subcutaneus soft tissue edema of the entire left upper extremity as well as left chest with associated areas of dermal thickening. No subcutaneus soft tissue emphysema. Medial to the biceps muscle along the distal arm there is a fluid hypodense collection measuring approximately 4.5 x 2 cm on axial imaging that appears external to the musculature and is likely associated with the fistula. Unable to evaluate vasculature on this noncontrast study. Unable to  evaluate known left upper extremity fistula. Anasarca noted. Other: Right chest wall central catheter with tip terminating at the superior cavoatrial junction. Coronary calcifications. Trace small volume left pleural effusion with associated passive atelectasis left lower lobe. Prominent cardiac size with suggestion of anemia given hyperdensity of the myocardium compared to the cardiac chambers. Large hiatal hernia. Atrophic kidneys. At least trace volume ascites. Colonic diverticulosis. Atherosclerotic plaque of the aorta. Question axillary lymphadenopathy on the left. IMPRESSION: 1. Finding likely represent interval development of a large intramuscular biceps hematoma (6 x 6 x 17 cm). Associated extensive left upper extremity edema. Underlying mass lesion/tumor not excluded. Compartment syndrome is probable in the setting. 2. Medial to the biceps muscle along the distal arm there is a fluid hypodense collection measuring approximately 4.5 x 2 cm that appears external to the muscle and is likely associated with the fistula. Markedly limited evaluation of a known fistula due to noncontrast study. 3. Left trace to small volume pleural effusion with associated atelectasis of the left lower lobe. 4. Question associated axillary lymphadenopathy on the left. 5. Other imaging findings of potential clinical significance: Large hiatal hernia. Volume overload with anasarca and at least trace volume ascites. Aortic Atherosclerosis (ICD10-I70.0). These results were called by telephone at the time of interpretation on 10/27/2020 at 1:35 am to provider [facility] , who verbally acknowledged these results. Electronically Signed   By: Iven Finn M.D.   On: 10/27/2020 01:47    EKG: I independently  viewed the EKG done and my findings are as followed: Sinus rhythm rate of 73, nonspecific ST-T changes.  QTc 550.  Assessment/Plan Present on Admission: . Compartment syndrome ([place])  Active Problems:   Compartment  syndrome (HCC)  Severe left upper extremity pain with concern for compartment syndrome. CT left upper extremity without contrast reveals findings concerning for possible compartment syndrome.  Underlying mass lesion not excluded. Seen by vascular surgery with plan to take to the OR on 10/27/2020 Pain control with IV Dilaudid as needed for severe pain, oxycodone as needed for moderate pain, Tylenol as needed for mild pain. Senokot 2 tablets nightly to avoid opioid-induced constipation. Eliquis held due to anticipated procedure in the OR.  Prolonged QTC 12 EKG with QTC of 550. Optimize potassium and magnesium levels with hemodialysis Avoid QTC prolonging agents Repeat twelve-lead EKG in the morning.  ESRD on HD TTS She has a right-sided temporary hemodialysis access Nephrology consult to resume hemodialysis States she has not missed her dialysis sessions Volume status and electrolytes managed with hemodialysis.  Chronic diastolic CHF Last 2D echo done on 09/28/2020 showed LVEF 55 to 60% and grade 2 diastolic dysfunction. Volume status managed with hemodialysis. Strict I's and O's and daily weight  Isolated elevated T bilirubin T bili 1.5 Nonspecific Repeat CMP in the morning  Anemia of chronic disease in the setting of ESRD Appears to be at her baseline hemoglobin 8.5 Continue to monitor H&H Transfuse as indicated.  Paroxysmal A. fib on Eliquis Hold off Eliquis for now due to suspected hematoma involving left upper extremity. She is on amiodarone prior to admission. Monitor on telemetry.   DVT prophylaxis: SCDs due to suspected hematoma involving left upper extremity and plan for procedure in the OR.  Code Status: Full code as stated by the patient herself.  Family Communication: None at bedside.  Disposition Plan: Admit to progressive/stepdown unit.  Consults called: Vascular surgery consulted by EDP.  Admission status: Inpatient status.  Patient will require at least  2 midnights for further evaluation and treatment of present condition.   Status is: Inpatient    Dispo:  Patient From: Home  Planned Disposition: Home, possibly on 10/29/2020 or when vascular surgery signs off.  Medically stable for discharge: No         Kayleen Memos MD [facility] Pager [phone]  If 7PM-7AM, please contact night-coverage [email or web address] Password [email or web address]  10/27/2020, 3:22 AM

## 2020-10-27 NOTE — Telephone Encounter (Signed)
Pt's daughter [name] called to let our office know that her mother [name] is currently hospitalized at [facility]. Pts daughter would like for Dr. Irish Lack or someone from our group to round on her mom while she is hospitalized. Please advise

## 2020-10-27 NOTE — Progress Notes (Signed)
Per Jeneen Rinks pharmacist okay to run albumin with neo drip.

## 2020-10-27 NOTE — Consult Note (Addendum)
[facility] Renal Consultation Note    Indication for Consultation:  Management of ESRD/hemodialysis; anemia, hypertension/volume and secondary hyperparathyroidism  HPI: [name] is a 75 y.o. female with ESRD on HD, combined systolic/diastolic HF, CM, PAFib on AC, HTN, glaucoma. She presented to [facility] ED yesterday with worsening swelling/pain in left upper extremity. Her LUE AVF was infiltrated at dialysis on 5/3. When she returned for treatment on 5/5 noted worsening swelling and was sent to [facility] for placement of tunneled catheter to allow fistula to rest. On arrival to ED noted worsening pain/swelling with loss of sensation to fingers.  Large intramuscular biceps hematoma on CT with concern for compartment syndrome. Vascular consulted and she underwent evacuation of hematoma in OR this am. Labs this am notable for Hgb 6.4.   Dialysis at [facility] T[facility]. Last HD was Saturday 5/7. She completed a full treatment.  Seen and examined at bedside. She's a little overwhelmed by everything that's happened. No cp,sob. Plan for dialysis today on schedule.    Past Medical History:  Diagnosis Date  . Arthritis of left knee   . Cardiomyopathy ([place])    a. h/o LV dysfunction EF 20-25% in 2013 due to sepsis.>> improved to normal   . Chronic diastolic CHF (congestive heart failure) ([place])    10/ 2013 in setting of septic shock  . Complication of anesthesia    use a little anesthesia , per patient MD states she quit breathing (2016); hard to wake up  . ESRD (end stage renal disease) ([place])    dialysis Tues, Thurs, Sat [address], sees dr deterding   . GERD (gastroesophageal reflux disease)   . Glaucoma    both eyes  . H/O hiatal hernia    a. CT 2017: large gastric hiatal hernia.  Marland Kitchen Headache(784.0)    migraine hx of  . History of blood transfusion 04/13/2015   . History of echocardiogram    a. Echo 6/17: EF 60-65%, normal wall motion, mild MR, atrial septal lipomatous  hypertrophy, PASP 34 mmHg, possible trivial free-flowing pericardial effusion along RV free wall // b. Echo 5/17: Mild LVH, EF 55-60%, normal wall motion, grade 1 diastolic dysfunction, trivial MR, severe LAE, mild RAE, PASP 42 mmHg  . History of kidney stones    10/18/2019: per patient "has a couple currently one in each kidney"  . History of nephrostomy 04/11/2015   currently inplace 04/28/2015  removed now  . History of nuclear stress test    a. Myoview 1/14 - Marked ischemia in the basal anterior, mid anterior, apical septal and apical inferior regions, EF 63% >> LHC normal   . Hyperlipidemia   . Hypertension    medication removed from regimen due to low blood pressure   . Iron deficiency    hx  . Myocardial infarction [facility]) 2013   10/18/2019: per patient "in 2013)  . Nephrolithiasis 2002, 2006   bilateral  . Normal coronary arteries 2014   a. LHC in 1/14: normal coronary arteries  . PAF (paroxysmal atrial fibrillation) ([place])    a. 10/ 2013  in setting of Septic Shock //  b. recurrent during admit for pneumonia, L effusion >> placed on Amiodarone // Coumadin for anticoagulation  . Pneumonia jan 2018, last tme lungs clear now   dx 10-06-2014 per CXR--  on 10-27-2014 pt states finished antibiotic and denies cough or fever  . Primary localized osteoarthritis of right hip 10/22/2019  . S/P hemodialysis catheter insertion ([place]) 04/11/2015  right anterior chest , only used once   . Sigmoid diverticulosis   . SOB (shortness of breath) on exertion    10/18/2019: per patient "get short of breath with activity sometimes due to heart valve"  . UTI (urinary tract infection) 05/10/2016   Past Surgical History:  Procedure Laterality Date  . AV FISTULA PLACEMENT Left 06/02/2015   Procedure: BRACHIOCEPHALIC ARTERIOVENOUS (AV) FISTULA CREATION ;  Surgeon: Conrad Eagle, MD;  Location: [place];  Service: Vascular;  Laterality: Left;  . BASCILIC VEIN TRANSPOSITION Left 07/27/2015   Procedure: FIRST STAGE  BASILIC VEIN TRANSPOSITION LEFT UPPER ARM;  Surgeon: Conrad Batavia, MD;  Location: [place];  Service: Vascular;  Laterality: Left;  . BASCILIC VEIN TRANSPOSITION Left 09/2015   second phase  . BASCILIC VEIN TRANSPOSITION Left 10/12/2015   Procedure: SECOND STAGE BASILIC VEIN TRANSPOSITION LEFT ARM;  Surgeon: Conrad Bentley, MD;  Location: [place];  Service: Vascular;  Laterality: Left;  . BREAST BIOPSY Left 08/23/07   benign fibrocystic with duct ectasia  . CARDIAC CATHETERIZATION  07-11-2012  dr Irish Lack   Abnormal stress test/   normal coronary arteries/  LVEDP  21mmHg  . CARDIOVASCULAR STRESS TEST  06-26-2012  dr Irish Lack   marked ischemia in the basal anterior, mid anterior, apical inferior regions/  normal LVF, ef 63%  . CATARACT EXTRACTION W/ INTRAOCULAR LENS  IMPLANT, BILATERAL    . COLONOSCOPY WITH PROPOFOL N/A 10/17/2016   Procedure: COLONOSCOPY WITH PROPOFOL;  Surgeon: Garlan Fair, MD;  Location: [facility];  Service: Endoscopy;  Laterality: N/A;  . CYSTOSCOPY W/ URETERAL STENT PLACEMENT  04/04/2012   Procedure: CYSTOSCOPY WITH RETROGRADE PYELOGRAM/URETERAL STENT PLACEMENT;  Surgeon: Ailene Rud, MD;  Location: [place];  Service: Urology;  Laterality: Left;  . CYSTOSCOPY W/ URETERAL STENT PLACEMENT Bilateral 05/04/2015   Procedure: CYSTOSCOPY WITH BILATERAL RETROGRADE PYELOGRAM/ WITH INTERPRETATION, EXCHANGE OF RIGHT URETERAL STENT REPLACEMENT AND PLACEMENT LEFT URETERAL STENT PLACEMENT EXAMINATION OF VAGINA;  Surgeon: Carolan Clines, MD;  Location: [facility];  Service: Urology;  Laterality: Bilateral;  . CYSTOSCOPY WITH STENT PLACEMENT Right 10/28/2014   Procedure: RIGHT URETERAL STENT PLACEMENT;  Surgeon: Irine Seal, MD;  Location[facility];  Service: Urology;  Laterality: Right;  . CYSTOSCOPY WITH STENT PLACEMENT Right 02/26/2015   Procedure: CYSTOSCOPY RETROGRADE PYELOGRAM WITH STENT PLACEMENT;  Surgeon: Cleon Gustin, MD;  Location: [facility];  Service:  Urology;  Laterality: Right;  . CYSTOSCOPY/RETROGRADE/URETEROSCOPY/STONE EXTRACTION WITH BASKET Right 11/21/2014   Procedure: CYSTOSCOPY/RIGHT RETROGRADE PYELOGRAM/RIGHT URETEROSCOPY/BASKET EXTRACTION/RIGHT PYELOSCOPY/LASER OF STONE/RIGHT DOUBLE J STENT;  Surgeon: Carolan Clines, MD;  Location: [place];  Service: Urology;  Laterality: Right;  . ESOPHAGOGASTRODUODENOSCOPY (EGD) WITH PROPOFOL Left 08/25/2018   Procedure: ESOPHAGOGASTRODUODENOSCOPY (EGD) WITH PROPOFOL;  Surgeon: Ronald Lobo, MD;  Location: [place];  Service: Endoscopy;  Laterality: Left;  . EXTRACORPOREAL SHOCK WAVE LITHOTRIPSY  05-28-2012  &  10-08-2012  . HIP ARTHROPLASTY Left 06/09/2020   Procedure: ARTHROPLASTY BIPOLAR HIP (HEMIARTHROPLASTY);  Surgeon: Marchia Bond, MD;  Location: [place];  Service: Orthopedics;  Laterality: Left;  . HOLMIUM LASER APPLICATION Right 6/0/1093   Procedure: HOLMIUM LASER APPLICATION;  Surgeon: Carolan Clines, MD;  Location: [facility];  Service: Urology;  Laterality: Right;  . IR GENERIC HISTORICAL  02/01/2016   IR NEPHROSTOMY EXCHANGE RIGHT 02/01/2016 Greggory Keen, MD [facility]INTER[facility]  . IR GENERIC HISTORICAL  02/24/2016   IR PATIENT EVAL TECH 0-60 MINS 02/24/2016 Aletta Edouard, MD [facility]  .  KNEE ARTHROSCOPY Left 02-14-2003  . LAPAROSCOPIC CHOLECYSTECTOMY  03-23-2005  . TOTAL ABDOMINAL HYSTERECTOMY W/ BILATERAL SALPINGOOPHORECTOMY  1993   secondary to fibroids  . TOTAL KNEE ARTHROPLASTY Left 10/22/2019  . TOTAL KNEE ARTHROPLASTY Left 10/22/2019   Procedure: TOTAL KNEE ARTHROPLASTY;  Surgeon: Marchia Bond, MD;  Location: [place];  Service: Orthopedics;  Laterality: Left;  . TRANSTHORACIC ECHOCARDIOGRAM  04-09-2012   normal LVF,  ef 60-65%,  mild LAE,  mild TR, trivial MR and PR   Family History  Problem Relation Age of Onset  . Hypertension Mother   . Cancer Mother 65       breast  . Dementia Mother   . Hypertension Brother   . Diabetes  Brother   . Heart disease Brother        before age 100  . Cancer Father 17       pancreatic  . Heart failure Paternal Grandmother   . Bladder Cancer Maternal Grandfather    Social History:  reports that she has never smoked. She has never used smokeless tobacco. She reports that she does not drink alcohol and does not use drugs. Allergies  Allergen Reactions  . Astemizole Nausea And Vomiting  . Fluorouracil Rash  . Percocet [Oxycodone-Acetaminophen] Nausea And Vomiting  . Vicodin [Hydrocodone-Acetaminophen] Nausea And Vomiting  . Vicodin [Hydrocodone-Acetaminophen] Nausea And Vomiting  . Chlorhexidine Rash    Sunburn    rash  . Chlorhexidine Rash    Pt states she gets rash similar to "sunburn"  . Percocet [Oxycodone-Acetaminophen] Nausea And Vomiting   Prior to Admission medications   Medication Sig Start Date End Date Taking? Authorizing Provider  acetaminophen (TYLENOL) 325 MG tablet Take 2 tablets (650 mg total) by mouth every 6 (six) hours as needed for mild pain, fever or headache. 06/24/20   Alma Friendly, MD  amiodarone (PACERONE) 200 MG tablet Take 1 tablet (200 mg total) by mouth daily. 08/24/20   Jettie Booze, MD  apixaban (ELIQUIS) 2.5 MG TABS tablet Take 1 tablet (2.5 mg total) by mouth 2 (two) times daily. 09/14/20   Jettie Booze, MD  bimatoprost (LUMIGAN) 0.01 % SOLN Place 1 drop into both eyes at bedtime.    [provider]  bimatoprost (LUMIGAN) 0.01 % SOLN Place 1 drop into both eyes at bedtime.    [provider]  calcium acetate (PHOSLO) 667 MG capsule Take 1,334 mg by mouth 3 (three) times daily with meals.     [provider]  calcium acetate (PHOSLO) 667 MG capsule Take 1 capsule (667 mg total) by mouth 3 (three) times daily with meals. 06/24/20   Alma Friendly, MD  cephALEXin (KEFLEX) 250 MG capsule Take 250 mg by mouth daily. 05/24/20   [provider]  cephALEXin (KEFLEX) 500 MG capsule Take 1 capsule  (500 mg total) by mouth 4 (four) times daily. 07/17/20   Deno Etienne, DO  diphenhydrAMINE (BENADRYL) 25 MG tablet Take 1 tablet (25 mg total) by mouth every 8 (eight) hours as needed. 06/24/20   Alma Friendly, MD  lidocaine-prilocaine (EMLA) cream Apply 1 application topically 3 (three) times a week.  09/09/19   [provider]  lidocaine-prilocaine (EMLA) cream Apply 1 application topically 3 (three) times a week. 05/23/20   [provider]  loperamide (IMODIUM A-D) 2 MG tablet Take 4-6 mg by mouth 4 (four) times daily as needed for diarrhea or loose stools.    [provider]  loperamide (IMODIUM A-D) 2  MG tablet Take 2 mg by mouth 4 (four) times daily as needed for diarrhea or loose stools.    [provider]  multivitamin (RENA-VIT) TABS tablet Take 1 tablet by mouth at bedtime. 07/27/19   [provider]  multivitamin (RENA-VIT) TABS tablet Take 1 tablet by mouth daily. 05/11/20   [provider]  Nutritional Supplements (FEEDING SUPPLEMENT, NEPRO CARB STEADY,) LIQD Take 237 mLs by mouth 2 (two) times daily between meals. 06/24/20   Alma Friendly, MD  ondansetron (ZOFRAN) 4 MG tablet Take 4 mg by mouth every 8 (eight) hours as needed for nausea or vomiting.    [provider]  oxyCODONE (ROXICODONE) 5 MG immediate release tablet Take 1 tablet (5 mg total) by mouth every 4 (four) hours as needed for severe pain. 10/24/19   Ventura Bruns, PA-C  saccharomyces boulardii (FLORASTOR) 250 MG capsule Take 1 capsule (250 mg total) by mouth 2 (two) times daily. 07/08/15   Donne Hazel, MD  saccharomyces boulardii (FLORASTOR) 250 MG capsule Take 250 mg by mouth daily.    [provider]  sodium chloride (OCEAN) 0.65 % SOLN nasal spray Place 1 spray into both nostrils 3 (three) times daily as needed for congestion.    [provider]  timolol (BETIMOL) 0.5 % ophthalmic solution Place 1 drop into both eyes every morning.      [provider]  timolol (BETIMOL) 0.5 % ophthalmic solution Place 1 drop into both eyes daily.    [provider]   Current Facility-Administered Medications  Medication Dose Route Frequency Provider Last Rate Last Admin  . 0.9 %  sodium chloride infusion (Manually program via Guardrails IV Fluids)   Intravenous Once Amy Desanctis, MD      . acetaminophen (TYLENOL) tablet 650 mg  650 mg Oral Q6H PRN Irene Pap N, DO      . albumin human 5 % solution           . amiodarone (PACERONE) tablet 200 mg  200 mg Oral Daily Irene Pap N, DO   200 mg at 10/27/20 1113  . HYDROmorphone (DILAUDID) injection 0.5 mg  0.5 mg Intravenous Q4H PRN Irene Pap N, DO   0.5 mg at 10/27/20 0857  . metoprolol tartrate (LOPRESSOR) injection 2.5 mg  2.5 mg Intravenous Q8H PRN Hall, Carole N, DO      . ondansetron (ZOFRAN-ODT) disintegrating tablet 4 mg  4 mg Oral Once Wyvonnia Dusky, MD      . oxyCODONE (Oxy IR/ROXICODONE) immediate release tablet 5 mg  5 mg Oral Q6H PRN Irene Pap N, DO      . prochlorperazine (COMPAZINE) injection 10 mg  10 mg Intravenous Q6H PRN Hall, Carole N, DO      . senna-docusate (Senokot-S) tablet 2 tablet  2 tablet Oral QHS Hall, Carole N, DO      . timolol (TIMOPTIC) 0.5 % ophthalmic solution 1 drop  1 drop Both Eyes Daily Hall, Carole N, DO   1 drop at 10/27/20 1106     ROS: As per HPI otherwise negative.  Physical Exam: Vitals:   10/27/20 0716 10/27/20 0731 10/27/20 0745 10/27/20 0819  BP: (!) 128/56 (!) 130/53 (!) 137/57 (!) 125/55  Pulse: 71 72 74 (!) 174  Resp: 16 17 12  (!) 9  Temp: (!) 97 F (36.1 C)  (!) 97 F (36.1 C) (!) 97.5 F (36.4 C)  TempSrc:    Oral  SpO2: 94% 93% 100% 96%  Weight:      Height:         General: WDWN woman, nad  Head: NCAT sclera not icteric MMM Neck: Supple. No JVD appreciated  Lungs: CTA bilaterally without wheezes, rales, or rhonchi. Breathing is unlabored. Heart: RRR with S1 S2 Abdomen: soft non-tender   Extremities: Entire L arm in ACE wrap; trace LE edema; bilaterally  Neuro: A & O  X 3. Moves all extremities spontaneously. Psych:  Responds to questions appropriately with a normal affect. Dialysis Access: R IJ TDC in place   Labs: Basic Metabolic Panel: Recent Labs  Lab 10/26/20 2303 10/27/20 0854  NA 136 136  K 3.5 3.6  CL 97* 97*  CO2 28 29  GLUCOSE 108* 109*  BUN 19 21  CREATININE 4.27* 4.53*  CALCIUM 8.5* 8.3*  PHOS  --  3.6   Liver Function Tests: Recent Labs  Lab 10/26/20 2303 10/27/20 0854  AST 23 22  ALT 12 12  ALKPHOS 97 74  BILITOT 1.5* 1.5*  PROT 4.5* 4.6*  ALBUMIN 2.0* 2.6*   No results for input(s): LIPASE, AMYLASE in the last 168 hours. No results for input(s): AMMONIA in the last 168 hours. CBC: Recent Labs  Lab 10/22/20 2350 10/26/20 2303 10/27/20 0854  WBC 7.7 14.6* 11.3*  NEUTROABS 6.4  --   --   HGB 8.4* 8.5* 6.4*  HCT 26.0* 27.5* 19.6*  MCV 90.3 96.5 92.0  PLT 177 287 190   Cardiac Enzymes: No results for input(s): CKTOTAL, CKMB, CKMBINDEX, TROPONINI in the last 168 hours. CBG: No results for input(s): GLUCAP in the last 168 hours. Iron Studies: No results for input(s): IRON, TIBC, TRANSFERRIN, FERRITIN in the last 72 hours. Studies/Results: CT Extrem Up Entire Arm L WO/CM  Result Date: 10/27/2020 CLINICAL DATA:  Rupture of arm fistula few days ago with interval increase in arm circumference. Marked edema and discoloration of the arm. Concern for compartment syndrome. Unable to insert IV. EXAM: CT OF THE UPPER LEFT EXTREMITY WITHOUT CONTRAST TECHNIQUE: Multidetector CT imaging of the upper left extremity was performed according to the standard protocol. COMPARISON:  None. FINDINGS: Bones/Joint/Cartilage No acute displaced fracture or dislocation. No cortical erosion or destruction. Partially visualized left hip arthroplasty. Ligaments Suboptimally assessed by CT. Muscles and Tendons Large hypo to isodense lesion within the biceps muscle  extending at least 17 cm in the craniocaudal dimension and measuring up to approximately 6 x 6 cm on axial images (5:72, 11:15). Soft tissues Extensive subcutaneus soft tissue edema of the entire left upper extremity as well as left chest with associated areas of dermal thickening. No subcutaneus soft tissue emphysema. Medial to the biceps muscle along the distal arm there is a fluid hypodense collection measuring approximately 4.5 x 2 cm on axial imaging that appears external to the musculature and is likely associated with the fistula. Unable to evaluate vasculature on this noncontrast study. Unable to evaluate known left upper extremity fistula. Anasarca noted. Other: Right chest wall central catheter with tip terminating at the superior cavoatrial junction. Coronary calcifications. Trace small volume left pleural effusion with associated passive atelectasis left lower lobe. Prominent cardiac size with suggestion of anemia given hyperdensity of the myocardium compared to the cardiac chambers. Large hiatal hernia. Atrophic kidneys. At least trace volume ascites. Colonic diverticulosis. Atherosclerotic plaque of the aorta. Question axillary lymphadenopathy on the left. IMPRESSION: 1. Finding likely represent interval development of a large intramuscular biceps hematoma (6 x 6 x 17 cm). Associated extensive left upper extremity  edema. Underlying mass lesion/tumor not excluded. Compartment syndrome is probable in the setting. 2. Medial to the biceps muscle along the distal arm there is a fluid hypodense collection measuring approximately 4.5 x 2 cm that appears external to the muscle and is likely associated with the fistula. Markedly limited evaluation of a known fistula due to noncontrast study. 3. Left trace to small volume pleural effusion with associated atelectasis of the left lower lobe. 4. Question associated axillary lymphadenopathy on the left. 5. Other imaging findings of potential clinical significance:  Large hiatal hernia. Volume overload with anasarca and at least trace volume ascites. Aortic Atherosclerosis (ICD10-I70.0). These results were called by telephone at the time of interpretation on 10/27/2020 at 1:35 am to provider [employer] , who verbally acknowledged these results. Electronically Signed   By: Iven Finn M.D.   On: 10/27/2020 01:47    Dialysis Orders:  [facility] TTS  4h 400/500 EDW 71kg 2K/2Ca UFP 2 No heparin  -Mircera 75 q 2 wks (last 5/5) No VDRA    Assessment/Plan: 1.  LUE hematoma: s/p OR evacuation this am per Dr. Cain/VVS  2.  ESRD -  HD TTS; Continue HD on schedule. HD today.  3.  Hypertension/volume  - BP controlled. No BP meds on home list. UF to EDW as tolerated.  4.  Anemia of CKD/ABLA   - Hgb 6.4 post op. For 1 unit prbcs today. Recent ESA dose as outpatient. Follow trends.  5. Metabolic bone disease -  Ca/Phos ok. Continue home binders.  6. PAfib- On Eliquis/amiodarone  7. Nutrition - Renal diet/fluid restriction. Prot supp for low albumin   Lynnda Child PA-C [facility] 10/27/2020, 11:34 AM     Seen and examined independently.  Agree with note and exam as documented above by physician extender and as noted here.  Patient with ESRD on HD, pafib presented with left arm pain and swelling and is s/p urgent exploration and evaluation of hematoma early this AM with vascular.  She is s/p tunneled catheter last Friday.  States problems started when had bleeding on last Thursday when needles were removed.   General adult female in bed in no acute distress HEENT normocephalic atraumatic extraocular movements intact sclera anicteric Neck supple trachea midline Lungs clear to auscultation bilaterally normal work of breathing at rest  Heart S1S2 no rub Abdomen soft nontender nondistended Extremities 1-2+ edema  Psych normal mood and affect Access RIJ tunneled catheter; left arm wrapped; bruit noted    LUE hematoma - s/p evacuation with  vascular this AM; appreciate their assistance  ESRD - HD per TTS schedule - orders for today  HTN - controlled   Anemia of acute blood loss as well as CKD.  I have ordered PRBC's for today - these can be given at any time and requested to give with HD if not yet given by the floor when she comes  Chronic combined systolic and diastolic CHF - optimize volume with HD  Metabolic bone disease - continue home binders  pafib - rate control and anticoagulation per primary team   Amy Desanctis, MD 10/27/2020 1:08 PM

## 2020-10-27 NOTE — Anesthesia Preprocedure Evaluation (Addendum)
Anesthesia Evaluation  Patient identified by MRN, date of birth, ID band Patient awake    Reviewed: Allergy & Precautions, NPO status , Patient's Chart, lab work & pertinent test results  History of Anesthesia Complications Negative for: history of anesthetic complications  Airway Mallampati: II  TM Distance: >3 FB Neck ROM: Full    Dental  (+) Dental Advisory Given, Chipped   Pulmonary neg pulmonary ROS,    Pulmonary exam normal        Cardiovascular hypertension, +CHF  + dysrhythmias Atrial Fibrillation + Valvular Problems/Murmurs AS and MR  Rhythm:Regular Rate:Normal + Systolic murmurs 1. Left ventricular ejection fraction, by estimation, is 55 to 60%. The left ventricle has normal function. The left ventricle has no regional wall motion abnormalities. There is mild left ventricular hypertrophy. Left ventricular diastolic parameters are consistent with Grade II diastolic dysfunction (pseudonormalization). The average left ventricular global longitudinal strain is -15.6 %. The global longitudinal strain is abnormal. 2. Right ventricular systolic function is normal. The right ventricular size is normal. There is moderately elevated pulmonary artery systolic pressure. The estimated right ventricular systolic pressure is 15.6 mmHg. 3. Left atrial size was moderately dilated. 4. Right atrial size was moderately dilated. 5. The mitral valve is abnormal. Moderate mitral valve regurgitation, ERO 0.27 cm^2. No evidence of mitral stenosis. 6. The aortic valve is tricuspid. Aortic valve regurgitation is trivial. Severe aortic valve stenosis. Aortic valve area, by VTI measures 0.92 cm. 7. The inferior vena cava is dilated in size with >50% respiratory variability, suggesting right atrial pressure of 8 mmHg   Neuro/Psych negative neurological ROS  negative psych ROS   GI/Hepatic Neg liver ROS, hiatal hernia,   Endo/Other  negative  endocrine ROS  Renal/GU ESRF and DialysisRenal disease     Musculoskeletal negative musculoskeletal ROS (+)   Abdominal   Peds  Hematology  (+) anemia ,   Anesthesia Other Findings Covid test negative   Reproductive/Obstetrics                            Anesthesia Physical  Anesthesia Plan  ASA: IV and emergent  Anesthesia Plan: General   Post-op Pain Management:    Induction: Intravenous  PONV Risk Score and Plan: 3 and Treatment may vary due to age or medical condition and Ondansetron  Airway Management Planned: Oral ETT  Additional Equipment:   Intra-op Plan:   Post-operative Plan: Extubation in OR  Informed Consent: I have reviewed the patients History and Physical, chart, labs and discussed the procedure including the risks, benefits and alternatives for the proposed anesthesia with the patient or authorized representative who has indicated his/her understanding and acceptance.     Dental advisory given  Plan Discussed with: CRNA and Anesthesiologist  Anesthesia Plan Comments:         Anesthesia Quick Evaluation

## 2020-10-27 NOTE — Op Note (Signed)
    Patient name: [name] MRN: [id number] DOB: 03/01/1946 Sex: female  10/27/2020 Pre-operative Diagnosis: End-stage renal disease, chronic anticoagulation, left upper extremity hematoma with left hand paresthesias Post-operative diagnosis:  Same plus left upper arm brachial artery pseudoaneurysm Surgeon:  Eda Paschal. Donzetta Matters, MD Procedure Performed: 1.  Evacuation left upper extremity hematoma 2.  Primary repair left brachial artery pseudoaneurysm   Indications: 75 year old female with history of end-stage renal disease.  Last Thursday after dialysis she developed hematoma and required tunneled dialysis catheter placement for dialysis on Friday.  Subsequently she developed significantly larger hematoma and pain with left hand paresthesias on Monday.  She presented to emergency room where CT scan was performed demonstrating large hematoma.  Patient is on reduced dose Eliquis for paroxysmal atrial fibrillation.  Given the paresthesias she was indicated for urgent exploration with evacuation of the hematoma.  Findings: There is a large hematoma cavity upon entering there was pulsatile bleeding identified as the brachial artery.  Tourniquet was placed.  The brachial artery was primarily repaired.  At completion there was a radial artery signal at the wrist and the fistula had a strong thrill.   Procedure:  The patient was identified in the holding area and taken to the operating room where she is placed supine on upper table and general anesthesia was induced.  She was sterilely prepped draped in the left upper extremity usual fashion, antibiotics were minister timeout was called.  I opened her previous incision along the medial aspect of her left upper arm that was created at the time of her second stage basilic vein transposition.  Initially identified a hematoma cavity.  I entered this bluntly where there was significant hematoma and pulsatile bleeding.  I was able to get a finger on what I assumed  was the brachial artery.  A tourniquet was then placed above this and inflated to 250 mmHg.  There was still some bleeding from the brachial artery I was able to dissect this free and clamped it proximally and distally and the tourniquet was allowed down.  I opened several centimeters of the brachial artery sheath to expose the median nerve which had likely been compressed.  The brachial artery was primarily repaired with 5-0 Prolene suture.  Prior to completing the anastomosis I allowed backbleeding and forward bleeding to flush and then irrigated with heparinized saline and completed the anastomosis.  There was very strong pulse proximally distally in the artery this was confirmed with Doppler and there was a good signal at the radial artery at the wrist with Doppler.  There was a palpable thrill in the fistula.  I thoroughly irrigated the wound.  I removed as much old hematoma as I could.  I then primarily closed the skin only with staples.  Sterile dressing was placed as well as Adaptic to the skin tear on the forearm.  4 inch Ace was placed on the hand and forearm and 6 inch Ace wrapped in the upper arm.  There was a palpable thrill in the fistula after placement of the Ace.  The patient was then awakened from anesthesia having tolerated procedure well without any complication.  All counts were correct at completion.  EBL: 200 cc    Avalin Briley C. Donzetta Matters, MD [facility] [place] Office: [phone] Pager: [phone]

## 2020-10-27 NOTE — Progress Notes (Signed)
   Patient seen and evaluated in PACU.  Numbness has resolved and left hand.  Ace wrap clean dry intact.  Palpable thrill in the fistula.  Tanyia Grabbe C. Donzetta Matters, MD [facility] [place] Office: [phone] Pager: [phone]

## 2020-10-27 NOTE — Anesthesia Procedure Notes (Signed)
Procedure Name: Intubation Date/Time: 10/27/2020 4:53 AM Performed by: Takuya Lariccia T, CRNA Pre-anesthesia Checklist: Patient identified, Emergency Drugs available, Suction available and Patient being monitored Patient Re-evaluated:Patient Re-evaluated prior to induction Oxygen Delivery Method: Circle system utilized Preoxygenation: Pre-oxygenation with 100% oxygen Induction Type: IV induction Ventilation: Mask ventilation without difficulty Laryngoscope Size: Miller and 2 Grade View: Grade I Tube type: Oral Tube size: 7.5 mm Number of attempts: 1 Airway Equipment and Method: Stylet and Oral airway Placement Confirmation: ETT inserted through vocal cords under direct vision,  positive ETCO2 and breath sounds checked- equal and bilateral Secured at: 21 cm Tube secured with: Tape Dental Injury: Teeth and Oropharynx as per pre-operative assessment

## 2020-10-28 ENCOUNTER — Ambulatory Visit: Payer: Medicare Other | Admitting: Interventional Cardiology

## 2020-10-28 ENCOUNTER — Encounter (HOSPITAL_COMMUNITY): Payer: Self-pay | Admitting: Vascular Surgery

## 2020-10-28 DIAGNOSIS — D649 Anemia, unspecified: Secondary | ICD-10-CM

## 2020-10-28 LAB — RENAL FUNCTION PANEL
Albumin: 1.8 g/dL — ABNORMAL LOW (ref 3.5–5.0)
Anion gap: 6 (ref 5–15)
BUN: 16 mg/dL (ref 8–23)
CO2: 30 mmol/L (ref 22–32)
Calcium: 7.9 mg/dL — ABNORMAL LOW (ref 8.9–10.3)
Chloride: 99 mmol/L (ref 98–111)
Creatinine, Ser: 3.36 mg/dL — ABNORMAL HIGH (ref 0.44–1.00)
GFR, Estimated: 14 mL/min — ABNORMAL LOW (ref >60)
Glucose, Bld: 93 mg/dL (ref 70–99)
Phosphorus: 3.1 mg/dL (ref 2.5–4.6)
Potassium: 3.7 mmol/L (ref 3.5–5.1)
Sodium: 135 mmol/L (ref 135–145)

## 2020-10-28 LAB — HEMOGLOBIN AND HEMATOCRIT, BLOOD
HCT: 23.8 % — ABNORMAL LOW (ref 36.0–46.0)
Hemoglobin: 8.1 g/dL — ABNORMAL LOW (ref 12.0–15.0)

## 2020-10-28 LAB — CBC
HCT: 19.9 % — ABNORMAL LOW (ref 36.0–46.0)
Hemoglobin: 6.5 g/dL — CL (ref 12.0–15.0)
MCH: 29.3 pg (ref 26.0–34.0)
MCHC: 32.7 g/dL (ref 30.0–36.0)
MCV: 89.6 fL (ref 80.0–100.0)
Platelets: 182 10*3/uL (ref 150–400)
RBC: 2.22 MIL/uL — ABNORMAL LOW (ref 3.87–5.11)
RDW: 22.4 % — ABNORMAL HIGH (ref 11.5–15.5)
WBC: 9.7 10*3/uL (ref 4.0–10.5)
nRBC: 0 % (ref 0.0–0.2)

## 2020-10-28 LAB — HEPATITIS B SURFACE ANTIBODY, QUANTITATIVE: Hep B S AB Quant (Post): 17.1 m[IU]/mL (ref >9.9)

## 2020-10-28 LAB — PREPARE RBC (CROSSMATCH)

## 2020-10-28 LAB — MAGNESIUM: Magnesium: 1.7 mg/dL (ref 1.7–2.4)

## 2020-10-28 MED ORDER — SODIUM CHLORIDE 0.9% IV SOLUTION
Freq: Once | INTRAVENOUS | Status: AC
  Administered 2020-10-28: 10 mL via INTRAVENOUS

## 2020-10-28 NOTE — Progress Notes (Signed)
:  10/28/20 at 0640    Critical Value: 6.5 Hgb  Name of Provider Notified: T. Opyd Triad MD   Orders: Notified on call MD. Awaiting orders     Marcina Millard RN

## 2020-10-28 NOTE — Evaluation (Signed)
Physical Therapy Evaluation Patient Details Name: [name] MRN: [id number] DOB: 1946-03-07 Today's Date: 10/28/2020   History of Present Illness  Pt is a 75 y.o. female who presented 5/9 with progressively worsening L UE pain with hematoma and numbness. CT left upper extremity without contrast showed findings with concern for compartment syndrome. S/p evacuation of L UE hematoma and primary repair on L brachial artery pseudoaneurysm 5/10. PMH: ESRD HD TTS, aortic valve stenosis paroxysmal A. fib on Eliquis, hx of MI, HTN, glaucoma, GERD, CHF, and malfunctioning left-sided fistula.    Clinical Impression  Pt presents with condition above and deficits mentioned below, see PT Problem List. Most recently PTA, pt was needing physical assistance from her daughter to get into/out of bed and transfer along with min guard assist for ambulating up to ~30 ft with a RW. Pt was also utilizing a w/c for independent household mobility. Currently, pt is requiring up to mod-maxA for bed mobility and modA to transfer to stand or laterally scoot EOB. Pt was unable to shift weight or maintain standing long enough to take steps this date. Pt with L UE pain limiting function, but pt was very motivated to participate despite the pain. Pt also displays generalized weakness, incoordination, imbalance, and decreased L UE ROM. Pt reports her daughter is unable to provide the level of physical assistance required at this time safely, thus recommend follow-up with skilled PT at [facility] if eligible and if not then SNF. Pt likely to deny SNF and elect to go home with HHPT. Will continue to follow acutely.    Follow Up Recommendations CIR;SNF (pt likely to refuse SNF and prefer HHPT)    Equipment Recommendations  3in1 (PT)    Recommendations for Other Services Rehab consult     Precautions / Restrictions Precautions Precautions: Fall Restrictions Weight Bearing Restrictions: No      Mobility  Bed Mobility Overal bed  mobility: Needs Assistance Bed Mobility: Supine to Sit;Sit to Supine;Rolling Rolling: Min assist   Supine to sit: Max assist Sit to supine: Mod assist   General bed mobility comments: Bed flat with rails down with transition supine > sit to simulate home set-up. Pt needing extra time to manage legs off EOB and HHA to pull up with R hand to sit up with maxA as pt unable to ascend trunk without physical assistance. ModA to manage legs to return to supine.    Transfers Overall transfer level: Needs assistance Equipment used: Rolling walker (2 wheeled) Transfers: Sit to/from Stand;Lateral/Scoot Transfers Sit to Stand: Mod assist;From elevated surface        Lateral/Scoot Transfers: Mod assist General transfer comment: Sit > stand 2x from EOB > RW with pt pushing up from bed and bil knees blocked, modA to extend knees and hips to obtain upright posture. ModA to scoot to R, needing assistance to clear buttocks and direct it.  Ambulation/Gait             General Gait Details: Unable to weight shift in standing to progress to taking steps  Stairs            Wheelchair Mobility    Modified Rankin (Stroke Patients Only)       Balance Overall balance assessment: Needs assistance Sitting-balance support: Bilateral upper extremity supported;Feet supported Sitting balance-Leahy Scale: Poor Sitting balance - Comments: UE support sitting statically EOB, supervision for safety.   Standing balance support: Bilateral upper extremity supported Standing balance-Leahy Scale: Poor Standing balance comment: Reliant on bil UE  and external assist.                             Pertinent Vitals/Pain Pain Assessment: 0-10 Pain Score: 7  Pain Location: L UE Pain Descriptors / Indicators: Discomfort;Grimacing;Guarding;Sharp;Aching Pain Intervention(s): Limited activity within patient's tolerance;Monitored during session;Repositioned    Home Living Family/patient expects to  be discharged to:: Private residence Living Arrangements: Children (daughter) Available Help at Discharge: Family;Available 24 hours/day Type of Home: House Home Access: Stairs to enter Entrance Stairs-Rails: None Entrance Stairs-Number of Steps: 2 Home Layout: One level Home Equipment: Grab bars - tub/shower;Walker - 4 wheels;Cane - quad;Cane - single point;Wheelchair - [employer] - 2 wheels      Prior Function Level of Independence: Needs assistance   Gait / Transfers Assistance Needed: Prior to fall in December and broke her hip, she was independent. Since then she has been utilizing her manual w/c and RW, walking up to ~30 ft with min guard. Pt independent with w/c. Pt needing assistance to power up to stand and occasionally to perform bed mobility. Daughter pushes pt in w/c up and down the stairs.  ADL's / Homemaking Assistance Needed: Daughter assists with bathing and donning/doffing socks/shoes. Daughter provides transportation, cleans, cooks, and assists with financial and medication management.        Hand Dominance   Dominant Hand: Right    Extremity/Trunk Assessment   Upper Extremity Assessment Upper Extremity Assessment: LUE deficits/detail LUE Deficits / Details: Decreased strength and ROM; ACE wrap around entire UE    Lower Extremity Assessment Lower Extremity Assessment: Generalized weakness (stiffness noted in bil legs, denies numbness/tingling)    Cervical / Trunk Assessment Cervical / Trunk Assessment: Kyphotic  Communication   Communication: No difficulties  Cognition Arousal/Alertness: Awake/alert Behavior During Therapy: WFL for tasks assessed/performed Overall Cognitive Status: Within Functional Limits for tasks assessed                                 General Comments: A&Ox4.      General Comments      Exercises     Assessment/Plan    PT Assessment Patient needs continued PT services  PT Problem List Decreased  strength;Decreased range of motion;Decreased activity tolerance;Decreased balance;Decreased mobility;Decreased coordination;Impaired sensation;Pain       PT Treatment Interventions DME instruction;Gait training;Stair training;Functional mobility training;Therapeutic activities;Therapeutic exercise;Balance training;Neuromuscular re-education;Patient/family education;Wheelchair mobility training    PT Goals (Current goals can be found in the Care Plan section)  Acute Rehab PT Goals Patient Stated Goal: to get therapy and go home PT Goal Formulation: With patient Time For Goal Achievement: 11/11/20 Potential to Achieve Goals: Good    Frequency Min 3X/week   Barriers to discharge        Co-evaluation               AM-PAC PT "6 Clicks" Mobility  Outcome Measure Help needed turning from your back to your side while in a flat bed without using bedrails?: A Lot Help needed moving from lying on your back to sitting on the side of a flat bed without using bedrails?: A Lot Help needed moving to and from a bed to a chair (including a wheelchair)?: Total Help needed standing up from a chair using your arms (e.g., wheelchair or bedside chair)?: A Lot Help needed to walk in hospital room?: Total Help needed climbing 3-5 steps with a railing? :  Total 6 Click Score: 9    End of Session Equipment Utilized During Treatment: Gait belt Activity Tolerance: Patient limited by pain Patient left: in bed;with call bell/phone within reach;with bed alarm set;with SCD's reapplied Nurse Communication: Mobility status;Need for lift equipment (stedy) PT Visit Diagnosis: Unsteadiness on feet (R26.81);Other abnormalities of gait and mobility (R26.89);Muscle weakness (generalized) (M62.81);Difficulty in walking, not elsewhere classified (R26.2);Pain Pain - Right/Left: Left Pain - part of body: Arm    Time: 1324-4010 PT Time Calculation (min) (ACUTE ONLY): 40 min   Charges:   PT Evaluation $PT Eval  Moderate Complexity: 1 Mod PT Treatments $Therapeutic Activity: 23-37 mins        Moishe Spice, PT, DPT Acute Rehabilitation Services  Pager: [phone] Office: [phone]   Orvan Falconer 10/28/2020, 7:01 PM

## 2020-10-28 NOTE — Progress Notes (Addendum)
  Progress Note    10/28/2020 8:46 AM 1 Day Post-Op  Subjective:  Still having some numbness in L hand   Vitals:   10/28/20 0805 10/28/20 0807  BP: 103/85 (!) 142/54  Pulse:  76  Resp:  13  Temp:  98.6 F (37 C)  SpO2:  97%   Physical Exam: Lungs:  Non labored Incisions:  L arm incision with some bleeding on dressing but viable skin edges Extremities:  L hand warm with good cap refill; unable to palpate radial pulse; palpable thrill in AV fistula Neurologic: a&O  CBC    Component Value Date/Time   WBC 9.7 10/28/2020 0457   RBC 2.22 (L) 10/28/2020 0457   HGB 6.5 (LL) 10/28/2020 0457   HGB 13.6 09/16/2013 1129   HCT 19.9 (L) 10/28/2020 0457   HCT 23.6 (L) 04/08/2015 0545   PLT 182 10/28/2020 0457   MCV 89.6 10/28/2020 0457   MCH 29.3 10/28/2020 0457   MCHC 32.7 10/28/2020 0457   RDW 22.4 (H) 10/28/2020 0457   LYMPHSABS 0.5 (L) 10/22/2020 2350   MONOABS 0.6 10/22/2020 2350   EOSABS 0.1 10/22/2020 2350   BASOSABS 0.1 10/22/2020 2350    BMET    Component Value Date/Time   NA 135 10/28/2020 0457   K 3.7 10/28/2020 0457   CL 99 10/28/2020 0457   CO2 30 10/28/2020 0457   GLUCOSE 93 10/28/2020 0457   BUN 16 10/28/2020 0457   CREATININE 3.36 (H) 10/28/2020 0457   CALCIUM 7.9 (L) 10/28/2020 0457   CALCIUM 8.8 11/07/2015 1107   GFRNONAA 14 (L) 10/28/2020 0457   GFRAA 8 (L) 11/13/2019 1744    INR    Component Value Date/Time   INR 1.8 (H) 10/27/2020 0854     Intake/Output Summary (Last 24 hours) at 10/28/2020 0846 Last data filed at 10/28/2020 0315 Gross per 24 hour  Intake 240 ml  Output 2000 ml  Net -1760 ml     Assessment/Plan:  75 y.o. female is s/p repair of L brachial artery and evacuation of hematoma 1 Day Post-Op   L hand warm and well perfused Bandage changed; encouraged patient to elevate L arm and exercise L hand Ok for d/c from vascular standpoint with follow up in 2 weeks for staple removal   Dagoberto Ligas, PA-C [facility] [phone] 10/28/2020 8:46 AM  I have independently interviewed and examined patient and agree with PA assessment and plan above.   Francee Setzer C. Donzetta Matters, MD [facility] [place] Office: [phone] Pager: [phone]

## 2020-10-28 NOTE — Progress Notes (Addendum)
[facility] Progress Note   Subjective:  Completed dialysis yesterday net UF 2L  Seen in room. \Some L arm pain. No cp, sob    Did get IV in - for another unit prbcs today   Objective Vitals:   10/27/20 2323 10/28/20 0300 10/28/20 0805 10/28/20 0807  BP: (!) 124/52 (!) 124/56 103/85 (!) 142/54  Pulse: 76 75  76  Resp: 16 20  13   Temp: 97.7 F (36.5 C) 98 F (36.7 C)  98.6 F (37 C)  TempSrc: Oral Oral  Oral  SpO2: 92% 93%  97%  Weight:      Height:         Additional Objective Labs: Basic Metabolic Panel: Recent Labs  Lab 10/26/20 2303 10/27/20 0854 10/28/20 0457  NA 136 136 135  K 3.5 3.6 3.7  CL 97* 97* 99  CO2 28 29 30   GLUCOSE 108* 109* 93  BUN 19 21 16   CREATININE 4.27* 4.53* 3.36*  CALCIUM 8.5* 8.3* 7.9*  PHOS  --  3.6 3.1   CBC: Recent Labs  Lab 10/22/20 2350 10/26/20 2303 10/27/20 0854 10/27/20 1107 10/28/20 0457  WBC 7.7 14.6* 11.3* 10.3 9.7  NEUTROABS 6.4  --   --   --   --   HGB 8.4* 8.5* 6.4* 6.3* 6.5*  HCT 26.0* 27.5* 19.6* 19.8* 19.9*  MCV 90.3 96.5 92.0 92.5 89.6  PLT 177 287 190 192 182   Blood Culture    Component Value Date/Time   SDES URINE, RANDOM 07/17/2020 1500   SPECREQUEST  07/17/2020 1500    NONE Performed at [facility] [address], [place], [place] [address]    CULT MULTIPLE SPECIES PRESENT, SUGGEST RECOLLECTION (A) 07/17/2020 1500   REPTSTATUS 07/18/2020 FINAL 07/17/2020 1500     Physical Exam General: Well appearing, nad  Heart: RRR Lungs: Clear, bilaterally  Abdomen: soft non-tender  Extremities: Trace LE edema  Dialysis Access: R IJ TDC; LUE AVF bandaged   Medications:  . sodium chloride   Intravenous Once  . sodium chloride   Intravenous Once  . sodium chloride   Intravenous Once  . amiodarone  200 mg Oral Daily  . ondansetron  4 mg Oral Once  . senna-docusate  2 tablet Oral QHS  . timolol  1 drop Both Eyes Daily    [facility] TTS  4h 400/500 EDW 71kg 2K/2Ca UFP 2 No heparin   -Mircera 75 q 2 wks (last 5/5) No VDRA    Assessment/Plan: 1. LUE hematoma: s/p evacuation 5/10 per Dr. Cain/VVS  2. ESRD -  HD TTS; Continue HD on schedule. Next HD 5/12.  3. Hypertension/volume  - BP controlled. No BP meds on home list. UF to EDW as tolerated.  4. Anemia of CKD/ABLA   - Hgb 6.4>6.5 post op. S/p 1 unit prbcs 5/10. Orders for another unit today. Recent ESA dose as outpatient. Follow trends.  5. Metabolic bone disease -  Ca/Phos ok. Continue home binders.  6. Chronic combined CHF -- volume removal with UF on HD  7. PAFib- On Eliquis/amiodarone  8. Nutrition - Renal diet/fluid restriction. Prot supp for low albumin   Lynnda Child PA-C [place] 10/28/2020,9:56 AM  Seen and examined independently.  Agree with note and exam as documented above by physician extender and as noted here.  S/p HD on 5/10 with PRBC's. She had trouble getting IV access but per charting they were able to get one today. No response to the  unit yesterday in HD  General adult female in bed in no acute distress HEENT normocephalic atraumatic extraocular movements intact sclera anicteric Neck supple trachea midline Lungs clear to auscultation bilaterally normal work of breathing at rest  Heart S1S2 no rub Abdomen soft nontender nondistended Extremities 1+ edema  Psych normal mood and affect Neuro alert and oriented x 3 provides hx and follows commands Access RIJ tunneled catheter; left arm is wrapped - bruit of AVF noted  ESRD - HD per TTS schedule; next HD tomorrow  LUE hematoma - s/p evacuation with vascular   HTN controlled   Anemia of acute blood loss - given post-op with acute blood loss not responding to PRBC's yesterday she needs an IV and PRBC's today; we are able to give additional blood on HD if needed.   Chronic combined systolic and diastolic CHF - optimize volume with HD  Metabolic bone disease - continue home binders  pafib - rate control and  anticoagulation per primary  Claudia Desanctis, MD 10/28/2020  11:47 AM

## 2020-10-28 NOTE — Progress Notes (Signed)
PROGRESS NOTE    [name]  EHM:[id number] DOB: May 20, 1946 DOA: 10/26/2020 PCP: Lajean Manes, MD    Brief Narrative:  75 year old F with PMH of ESRD on HD TTS, aortic valve stenosis, paroxysmal A. fib on Eliquis, malfunctioning left arm aVF and debility presented to ED with progressive LUE pain and numbness due to bleeding from malfunctioning left arm aVF with hematoma, and admitted for compartment syndrome.    Patient underwent emergent evacuation of LUE hematoma and repair of left brachial artery pseudoaneurysm by Dr. Donzetta Matters with significant improvement in her pain and near resolution of hyperesthesia. Received 1 unit of PRBC transfusion 5/10 with dialysis.  Hemoglobin 6.5.   Assessment & Plan:   Active Problems:   Compartment syndrome (HCC)  Left upper extremity compartment syndrome due to brachial artery pseudoaneurysm/malfunctioning AV fistula: Status postrepair and evacuation.  Clinically improving.  Continue elevation.  Rest of the fistula and using permacath.  Pain controlled.  Mobility.  As per surgery.  ESRD on hemodialysis TTS: Received permacath 5/10.  Dialysis 5/10.  Next Alysis 5/12.  Followed by nephrology.  Chronic diastolic congestive heart failure: Fairly euvolemic.  Fluid management with dialysis.  Acute blood loss anemia with anemia of chronic disease of ESRD: Recent on hemoglobin 8.4-6.4-1 unit of PRBC 5/10-6.5. No current evidence of active bleeding. Patient with difficulty IV line.  She has AV fistula on the left arm.  Permacath placed on the right IJ. Multiple peripheral IV attempts unsuccessful. Will continue to try for IV access, however will avoid central line. As patient is stable, she can get another transfusion with hemodialysis in case unable to establish peripheral IV.  Discussed with nephrology.  Paroxysmal A. fib: Currently sinus rhythm.  On amiodarone.  Eliquis on hold.  Will resume once hemoglobin stabilizes.  Debility: Wheelchair-bound and  walks with a walker at baseline.  She will work with PT today.  Chronic anemia, okay to work with therapy.  Start mobilizing.  Anticipate discharge home once cleared by surgery and possibly after dialysis tomorrow.   DVT prophylaxis: Place and maintain sequential compression device Start: 10/27/20 2155   Code Status: Full code Family Communication: None at the bedside Disposition Plan: Status is: Inpatient  Remains inpatient appropriate because:Inpatient level of care appropriate due to severity of illness   Dispo:  Patient From: Home  Planned Disposition: Home  Medically stable for discharge: No           Consultants:   Vascular surgery  Nephrology  Procedures:   Permacath 5/10  Clot evacuation left AV fistula 5/10  Antimicrobials:   None   Subjective: Patient seen and examined.  No overnight events.  Patient herself denies any complaints other than ongoing mild to moderate pain on her left arm but less than on presentation.  Remains afebrile.  She has not been out of bed yet.  Uneventful dialysis with right-sided permacath yesterday.  Objective: Vitals:   10/27/20 2323 10/28/20 0300 10/28/20 0805 10/28/20 0807  BP: (!) 124/52 (!) 124/56 103/85 (!) 142/54  Pulse: 76 75  76  Resp: 16 20  13   Temp: 97.7 F (36.5 C) 98 F (36.7 C)  98.6 F (37 C)  TempSrc: Oral Oral  Oral  SpO2: 92% 93%  97%  Weight:      Height:        Intake/Output Summary (Last 24 hours) at 10/28/2020 0810 Last data filed at 10/28/2020 0315 Gross per 24 hour  Intake 240 ml  Output 2000 ml  Net -  1760 ml   Filed Weights   10/26/20 2256 10/27/20 1300 10/27/20 1608  Weight: 77.6 kg 77.3 kg 75 kg    Examination:  General exam: Appears calm and comfortable, on room air. Respiratory system: Clear to auscultation. Respiratory effort normal.  No added sounds. Permacath right chest wall. Cardiovascular system: S1 & S2 heard, RRR.  Gastrointestinal system: Abdomen is nondistended,  soft and nontender. No organomegaly or masses felt. Normal bowel sounds heard. Central nervous system: Alert and oriented. No focal neurological deficits. Extremities: Symmetric 5 x 5 power. Left upper extremity dressing with Ace wrap, distal neurovascular status intact.    Data Reviewed: I have personally reviewed following labs and imaging studies  CBC: Recent Labs  Lab 10/22/20 2350 10/26/20 2303 10/27/20 0854 10/27/20 1107 10/28/20 0457  WBC 7.7 14.6* 11.3* 10.3 9.7  NEUTROABS 6.4  --   --   --   --   HGB 8.4* 8.5* 6.4* 6.3* 6.5*  HCT 26.0* 27.5* 19.6* 19.8* 19.9*  MCV 90.3 96.5 92.0 92.5 89.6  PLT 177 287 190 192 341   Basic Metabolic Panel: Recent Labs  Lab 10/26/20 2303 10/27/20 0854 10/28/20 0457  NA 136 136 135  K 3.5 3.6 3.7  CL 97* 97* 99  CO2 28 29 30   GLUCOSE 108* 109* 93  BUN 19 21 16   CREATININE 4.27* 4.53* 3.36*  CALCIUM 8.5* 8.3* 7.9*  MG  --  2.0 1.7  PHOS  --  3.6 3.1   GFR: Estimated Creatinine Clearance: 14 mL/min (A) (by C-G formula based on SCr of 3.36 mg/dL (H)). Liver Function Tests: Recent Labs  Lab 10/26/20 2303 10/27/20 0854 10/28/20 0457  AST 23 22  --   ALT 12 12  --   ALKPHOS 97 74  --   BILITOT 1.5* 1.5*  --   PROT 4.5* 4.6*  --   ALBUMIN 2.0* 2.6* 1.8*   No results for input(s): LIPASE, AMYLASE in the last 168 hours. No results for input(s): AMMONIA in the last 168 hours. Coagulation Profile: Recent Labs  Lab 10/27/20 0854  INR 1.8*   Cardiac Enzymes: No results for input(s): CKTOTAL, CKMB, CKMBINDEX, TROPONINI in the last 168 hours. BNP (last 3 results) No results for input(s): PROBNP in the last 8760 hours. HbA1C: No results for input(s): HGBA1C in the last 72 hours. CBG: No results for input(s): GLUCAP in the last 168 hours. Lipid Profile: No results for input(s): CHOL, HDL, LDLCALC, TRIG, CHOLHDL, LDLDIRECT in the last 72 hours. Thyroid Function Tests: No results for input(s): TSH, T4TOTAL, FREET4, T3FREE,  THYROIDAB in the last 72 hours. Anemia Panel: No results for input(s): VITAMINB12, FOLATE, FERRITIN, TIBC, IRON, RETICCTPCT in the last 72 hours. Sepsis Labs: No results for input(s): PROCALCITON, LATICACIDVEN in the last 168 hours.  Recent Results (from the past 240 hour(s))  Resp Panel by RT-PCR (Flu A&B, Covid) Nasopharyngeal Swab     Status: None   Collection Time: 10/27/20  2:44 AM   Specimen: Nasopharyngeal Swab; Nasopharyngeal(NP) swabs in vial transport medium  Result Value Ref Range Status   SARS Coronavirus 2 by RT PCR NEGATIVE NEGATIVE Final    Comment: (NOTE) SARS-CoV-2 target nucleic acids are NOT DETECTED.  The SARS-CoV-2 RNA is generally detectable in upper respiratory specimens during the acute phase of infection. The lowest concentration of SARS-CoV-2 viral copies this assay can detect is 138 copies/mL. A negative result does not preclude SARS-Cov-2 infection and should not be used as the sole basis for treatment  or other patient management decisions. A negative result may occur with  improper specimen collection/handling, submission of specimen other than nasopharyngeal swab, presence of viral mutation(s) within the areas targeted by this assay, and inadequate number of viral copies(<138 copies/mL). A negative result must be combined with clinical observations, patient history, and epidemiological information. The expected result is Negative.  Fact Sheet for Patients:  EntrepreneurPulse.com.au  Fact Sheet for Healthcare Providers:  IncredibleEmployment.be  This test is no t yet approved or cleared by the [place] FDA and  has been authorized for detection and/or diagnosis of SARS-CoV-2 by FDA under an Emergency Use Authorization (EUA). This EUA will remain  in effect (meaning this test can be used) for the duration of the COVID-19 declaration under Section 564(b)(1) of the Act, 21 U.S.C.section 360bbb-3(b)(1), unless the  authorization is terminated  or revoked sooner.       Influenza A by PCR NEGATIVE NEGATIVE Final   Influenza B by PCR NEGATIVE NEGATIVE Final    Comment: (NOTE) The Xpert Xpress SARS-CoV-2/FLU/RSV plus assay is intended as an aid in the diagnosis of influenza from Nasopharyngeal swab specimens and should not be used as a sole basis for treatment. Nasal washings and aspirates are unacceptable for Xpert Xpress SARS-CoV-2/FLU/RSV testing.  Fact Sheet for Patients: EntrepreneurPulse.com.au  Fact Sheet for Healthcare Providers: IncredibleEmployment.be  This test is not yet approved or cleared by the [place] FDA and has been authorized for detection and/or diagnosis of SARS-CoV-2 by FDA under an Emergency Use Authorization (EUA). This EUA will remain in effect (meaning this test can be used) for the duration of the COVID-19 declaration under Section 564(b)(1) of the Act, 21 U.S.C. section 360bbb-3(b)(1), unless the authorization is terminated or revoked.  Performed at [facility], [place] [address], [place], [place] [address]   MRSA PCR Screening     Status: None   Collection Time: 10/27/20  8:22 AM   Specimen: Nasal Mucosa; Nasopharyngeal  Result Value Ref Range Status   MRSA by PCR NEGATIVE NEGATIVE Final    Comment:        The GeneXpert MRSA Assay (FDA approved for NASAL specimens only), is one component of a comprehensive MRSA colonization surveillance program. It is not intended to diagnose MRSA infection nor to guide or monitor treatment for MRSA infections. Performed at [facility], [place] [address], [place],  [address]          Radiology Studies: CT Extrem Up Entire Arm L WO/CM  Result Date: 10/27/2020 CLINICAL DATA:  Rupture of arm fistula few days ago with interval increase in arm circumference. Marked edema and discoloration of the arm. Concern for compartment syndrome. Unable to insert IV. EXAM: CT  OF THE UPPER LEFT EXTREMITY WITHOUT CONTRAST TECHNIQUE: Multidetector CT imaging of the upper left extremity was performed according to the standard protocol. COMPARISON:  None. FINDINGS: Bones/Joint/Cartilage No acute displaced fracture or dislocation. No cortical erosion or destruction. Partially visualized left hip arthroplasty. Ligaments Suboptimally assessed by CT. Muscles and Tendons Large hypo to isodense lesion within the biceps muscle extending at least 17 cm in the craniocaudal dimension and measuring up to approximately 6 x 6 cm on axial images (5:72, 11:15). Soft tissues Extensive subcutaneus soft tissue edema of the entire left upper extremity as well as left chest with associated areas of dermal thickening. No subcutaneus soft tissue emphysema. Medial to the biceps muscle along the distal arm there is a fluid hypodense collection measuring approximately 4.5 x 2  cm on axial imaging that appears external to the musculature and is likely associated with the fistula. Unable to evaluate vasculature on this noncontrast study. Unable to evaluate known left upper extremity fistula. Anasarca noted. Other: Right chest wall central catheter with tip terminating at the superior cavoatrial junction. Coronary calcifications. Trace small volume left pleural effusion with associated passive atelectasis left lower lobe. Prominent cardiac size with suggestion of anemia given hyperdensity of the myocardium compared to the cardiac chambers. Large hiatal hernia. Atrophic kidneys. At least trace volume ascites. Colonic diverticulosis. Atherosclerotic plaque of the aorta. Question axillary lymphadenopathy on the left. IMPRESSION: 1. Finding likely represent interval development of a large intramuscular biceps hematoma (6 x 6 x 17 cm). Associated extensive left upper extremity edema. Underlying mass lesion/tumor not excluded. Compartment syndrome is probable in the setting. 2. Medial to the biceps muscle along the distal arm  there is a fluid hypodense collection measuring approximately 4.5 x 2 cm that appears external to the muscle and is likely associated with the fistula. Markedly limited evaluation of a known fistula due to noncontrast study. 3. Left trace to small volume pleural effusion with associated atelectasis of the left lower lobe. 4. Question associated axillary lymphadenopathy on the left. 5. Other imaging findings of potential clinical significance: Large hiatal hernia. Volume overload with anasarca and at least trace volume ascites. Aortic Atherosclerosis (ICD10-I70.0). These results were called by telephone at the time of interpretation on 10/27/2020 at 1:35 am to provider [facility] , who verbally acknowledged these results. Electronically Signed   By: Iven Finn M.D.   On: 10/27/2020 01:47        Scheduled Meds: . sodium chloride   Intravenous Once  . sodium chloride   Intravenous Once  . sodium chloride   Intravenous Once  . amiodarone  200 mg Oral Daily  . ondansetron  4 mg Oral Once  . senna-docusate  2 tablet Oral QHS  . timolol  1 drop Both Eyes Daily   Continuous Infusions:   LOS: 1 day    Time spent: 32 minutes    Barb Merino, MD [facility] Pager [phone]

## 2020-10-28 NOTE — Plan of Care (Signed)

## 2020-10-28 NOTE — Discharge Instructions (Signed)
° °  [facility] ° °Discharge Instructions ° °AV Fistula or Graft Surgery for Dialysis Access ° °Please refer to the following instructions for your post-procedure care. Your surgeon or physician assistant will discuss any changes with you. ° °Activity ° °You may drive the day following your surgery, if you are comfortable and no longer taking prescription pain medication. Resume full activity as the soreness in your incision resolves. ° °Bathing/Showering ° °You may shower after you go home. Keep your incision dry for 48 hours. Do not soak in a bathtub, hot tub, or swim until the incision heals completely. You may not shower if you have a hemodialysis catheter. ° °Incision Care ° °Clean your incision with mild soap and water after 48 hours. Pat the area dry with a clean towel. You do not need a bandage unless otherwise instructed. Do not apply any ointments or creams to your incision. You may have skin glue on your incision. Do not peel it off. It will come off on its own in about one week. Your arm may swell a bit after surgery. To reduce swelling use pillows to elevate your arm so it is above your heart. Your doctor will tell you if you need to lightly wrap your arm with an ACE bandage. ° °Diet ° °Resume your normal diet. There are not special food restrictions following this procedure. In order to heal from your surgery, it is CRITICAL to get adequate nutrition. Your body requires vitamins, minerals, and protein. Vegetables are the best source of vitamins and minerals. Vegetables also provide the perfect balance of protein. Processed food has little nutritional value, so try to avoid this. ° °Medications ° °Resume taking all of your medications. If your incision is causing pain, you may take over-the counter pain relievers such as acetaminophen (Tylenol). If you were prescribed a stronger pain medication, please be aware these medications can cause nausea and constipation. Prevent  nausea by taking the medication with a snack or meal. Avoid constipation by drinking plenty of fluids and eating foods with high amount of fiber, such as fruits, vegetables, and grains. Do not take Tylenol if you are taking prescription pain medications. ° ° ° ° °Follow up °Your surgeon may want to see you in the office following your access surgery. If so, this will be arranged at the time of your surgery. ° °Please call us immediately for any of the following conditions: ° °Increased pain, redness, drainage (pus) from your incision site °Fever of 101 degrees or higher °Severe or worsening pain at your incision site °Hand pain or numbness. ° °Reduce your risk of vascular disease: ° °Stop smoking. If you would like help, call [facility] at 1-800-QUIT-NOW (1-800-784-8669) or [place] at [phone] ° °Manage your cholesterol °Maintain a desired weight °Control your diabetes °Keep your blood pressure down ° °Dialysis ° °It will take several weeks to several months for your new dialysis access to be ready for use. Your surgeon will determine when it is OK to use it. Your nephrologist will continue to direct your dialysis. You can continue to use your Permcath until your new access is ready for use. ° °If you have any questions, please call the office at [phone]. ° °

## 2020-10-28 NOTE — Progress Notes (Signed)
Cross-coverage note:   Hgb 6.5 this morning. She had evacuation of LUE hematoma yesterday. Hemodynamically stable. Plan to transfuse one unit RBC and check post-transfusion H&H.

## 2020-10-29 LAB — TYPE AND SCREEN
ABO/RH(D): O POS
Antibody Screen: NEGATIVE
Unit division: 0
Unit division: 0

## 2020-10-29 LAB — BPAM RBC
Blood Product Expiration Date: 202205142359
Blood Product Expiration Date: 202205172359
ISSUE DATE / TIME: 202205101335
ISSUE DATE / TIME: 202205111100
Unit Type and Rh: 5100
Unit Type and Rh: 9500

## 2020-10-29 LAB — RENAL FUNCTION PANEL
Albumin: 1.6 g/dL — ABNORMAL LOW (ref 3.5–5.0)
Anion gap: 6 (ref 5–15)
BUN: 27 mg/dL — ABNORMAL HIGH (ref 8–23)
CO2: 29 mmol/L (ref 22–32)
Calcium: 8.1 mg/dL — ABNORMAL LOW (ref 8.9–10.3)
Chloride: 99 mmol/L (ref 98–111)
Creatinine, Ser: 4.66 mg/dL — ABNORMAL HIGH (ref 0.44–1.00)
GFR, Estimated: 9 mL/min — ABNORMAL LOW (ref >60)
Glucose, Bld: 85 mg/dL (ref 70–99)
Phosphorus: 2.4 mg/dL — ABNORMAL LOW (ref 2.5–4.6)
Potassium: 3.8 mmol/L (ref 3.5–5.1)
Sodium: 134 mmol/L — ABNORMAL LOW (ref 135–145)

## 2020-10-29 LAB — CBC
HCT: 23.3 % — ABNORMAL LOW (ref 36.0–46.0)
Hemoglobin: 7.8 g/dL — ABNORMAL LOW (ref 12.0–15.0)
MCH: 29.4 pg (ref 26.0–34.0)
MCHC: 33.5 g/dL (ref 30.0–36.0)
MCV: 87.9 fL (ref 80.0–100.0)
Platelets: 209 10*3/uL (ref 150–400)
RBC: 2.65 MIL/uL — ABNORMAL LOW (ref 3.87–5.11)
RDW: 23.3 % — ABNORMAL HIGH (ref 11.5–15.5)
WBC: 11.6 10*3/uL — ABNORMAL HIGH (ref 4.0–10.5)
nRBC: 0 % (ref 0.0–0.2)

## 2020-10-29 MED ORDER — PENTAFLUOROPROP-TETRAFLUOROETH EX AERO: 1.0000 "application " | INHALATION_SPRAY | CUTANEOUS | Status: DC | PRN

## 2020-10-29 MED ORDER — LIDOCAINE-PRILOCAINE 2.5-2.5 % EX CREA: 1.0000 "application " | TOPICAL_CREAM | CUTANEOUS | Status: DC | PRN

## 2020-10-29 MED ORDER — SODIUM CHLORIDE 0.9 % IV SOLN: 100.0000 mL | INTRAVENOUS | Status: DC | PRN

## 2020-10-29 MED ORDER — HEPARIN SODIUM (PORCINE) 1000 UNIT/ML IJ SOLN
INTRAMUSCULAR | Status: AC
  Administered 2020-10-29: 1000 [IU] via INTRAVENOUS_CENTRAL
  Filled 2020-10-29: qty 1

## 2020-10-29 MED ORDER — HEPARIN SODIUM (PORCINE) 1000 UNIT/ML DIALYSIS: 1000.0000 [IU] | INTRAMUSCULAR | Status: DC | PRN

## 2020-10-29 MED ORDER — ALTEPLASE 2 MG IJ SOLR: 2.0000 mg | Freq: Once | INTRAMUSCULAR | Status: DC | PRN

## 2020-10-29 MED ORDER — LIDOCAINE HCL (PF) 1 % IJ SOLN: 5.0000 mL | INTRAMUSCULAR | Status: DC | PRN

## 2020-10-29 MED ORDER — PROSOURCE PLUS PO LIQD
30.0000 mL | Freq: Two times a day (BID) | ORAL | Status: DC
  Administered 2020-10-29 – 2020-11-02 (×2): 30 mL via ORAL
  Filled 2020-10-29 (×5): qty 30

## 2020-10-29 NOTE — Progress Notes (Signed)
Physical Therapy Treatment Patient Details Name: [name] MRN: [id number] DOB: 12/23/1945 Today's Date: 10/29/2020    History of Present Illness Pt is a 75 y.o. female who presented 5/9 with progressively worsening L UE pain with hematoma and numbness. CT left upper extremity without contrast showed findings with concern for compartment syndrome. S/p evacuation of L UE hematoma and primary repair on L brachial artery pseudoaneurysm 5/10. PMH: ESRD HD TTS, aortic valve stenosis paroxysmal A. fib on Eliquis, hx of MI, HTN, glaucoma, GERD, CHF, and malfunctioning left-sided fistula.    PT Comments    Pt is making good progress towards her goals through being able to weight shift to ambulate a short distance of up to ~7 ft with a RW and minAx2. Pt continues to require extensive assistance of up to modAx2 to come to stand from a lower surface due to decreased strength and balance. Pt motivated to improve. Recommend follow-up with CIR. Will continue to follow acutely.    Follow Up Recommendations  CIR;Supervision/Assistance - 24 hour     Equipment Recommendations  3in1 (PT)    Recommendations for Other Services       Precautions / Restrictions Precautions Precautions: Fall Precaution Comments: LUE has staples on inside of upper arm and skin tear on bottom part of arm Restrictions Weight Bearing Restrictions: No    Mobility  Bed Mobility Overal bed mobility: Needs Assistance Bed Mobility: Supine to Sit     Supine to sit: Mod assist;+2 for physical assistance     General bed mobility comments: HOB flat, A at legs and trunk. Cues to slide legs off EOB.    Transfers Overall transfer level: Needs assistance Equipment used: Rolling walker (2 wheeled) Transfers: Sit to/from Stand Sit to Stand: Mod assist;+2 physical assistance;+2 safety/equipment         General transfer comment: Min A +2 sit<>stand from raised bed 1x with gait belt, Mod A +2 sit<>stand from recliner 3x  with use of gait belt and chair pad and momentum, cuing pt to scoot anteriorly and rock.  Ambulation/Gait Ambulation/Gait assistance: Min assist;+2 physical assistance;+2 safety/equipment Gait Distance (Feet): 7 Feet Assistive device: Rolling walker (2 wheeled) Gait Pattern/deviations: Step-through pattern;Decreased step length - right;Decreased step length - left;Decreased stride length;Decreased weight shift to right;Decreased weight shift to left;Trunk flexed Gait velocity: reduced Gait velocity interpretation: <1.31 ft/sec, indicative of household ambulator General Gait Details: Ambulated anteriorly with minAx2 to steady, bring chair behind pt, and manage lines. Cues to shift weight and advance legs, with increased time and noted trunk sway. No appreciative knee buckling noted even though pt reported feeling like her legs were going to "give out".   Stairs             Wheelchair Mobility    Modified Rankin (Stroke Patients Only)       Balance Overall balance assessment: Needs assistance Sitting-balance support: No upper extremity supported;Feet supported Sitting balance-Leahy Scale: Fair     Standing balance support: Bilateral upper extremity supported Standing balance-Leahy Scale: Poor Standing balance comment: Reliant on bil UE and external assist.                            Cognition Arousal/Alertness: Awake/alert Behavior During Therapy: Flat affect Overall Cognitive Status: Within Functional Limits for tasks assessed  Exercises General Exercises - Upper Extremity Elbow Flexion: Left;AAROM;Seated;10 reps Elbow Extension: AAROM;Left;10 reps;Seated Digit Composite Flexion: AROM;Left;10 reps;Seated Composite Extension: AROM;Left;10 reps;Seated Other Exercises Other Exercises: Educated pt and dtr on LUE exercises for hand and elbow alternating them during commericals Other Exercises: Marching x10  steps each foot standing with bil UEs on RW, x2 sets with seated rest breaks between each    General Comments        Pertinent Vitals/Pain Pain Assessment: Faces Faces Pain Scale: Hurts little more Pain Location: left hand (pins and needles) Pain Descriptors / Indicators: Numbness;Tingling Pain Intervention(s): Limited activity within patient's tolerance;Monitored during session;Repositioned    Home Living Family/patient expects to be discharged to:: Inpatient rehab Living Arrangements: Children (daughter) Available Help at Discharge: Family;Available 24 hours/day Type of Home: House Home Access: Stairs to enter Entrance Stairs-Rails: None Home Layout: One level Home Equipment: Grab bars - tub/shower;Walker - 4 wheels;Cane - quad;Cane - single point;Wheelchair - [employer] - 2 wheels      Prior Function Level of Independence: Needs assistance  Gait / Transfers Assistance Needed: Prior to fall in December and broke her hip, she was independent. Since then she has been utilizing her manual w/c and RW, walking up to ~30 ft with min guard. Pt independent with w/c. Pt needing assistance to power up to stand and occasionally to perform bed mobility. Daughter pushes pt in w/c up and down the stairs. ADL's / Homemaking Assistance Needed: Daughter assists with bathing and donning/doffing socks/shoes. Daughter provides transportation, cleans, cooks, and assists with financial and medication management.     PT Goals (current goals can now be found in the care plan section) Acute Rehab PT Goals Patient Stated Goal: to go to rehab here in hospital and then home PT Goal Formulation: With patient/family Time For Goal Achievement: 11/11/20 Potential to Achieve Goals: Good Progress towards PT goals: Progressing toward goals    Frequency    Min 3X/week      PT Plan Discharge plan needs to be updated    Co-evaluation PT/OT/SLP Co-Evaluation/Treatment: Yes Reason for Co-Treatment: For  patient/therapist safety;To address functional/ADL transfers PT goals addressed during session: Mobility/safety with mobility;Balance;Proper use of DME        AM-PAC PT "6 Clicks" Mobility   Outcome Measure  Help needed turning from your back to your side while in a flat bed without using bedrails?: A Lot Help needed moving from lying on your back to sitting on the side of a flat bed without using bedrails?: A Lot Help needed moving to and from a bed to a chair (including a wheelchair)?: A Lot Help needed standing up from a chair using your arms (e.g., wheelchair or bedside chair)?: A Lot Help needed to walk in hospital room?: A Lot Help needed climbing 3-5 steps with a railing? : Total 6 Click Score: 11    End of Session Equipment Utilized During Treatment: Gait belt Activity Tolerance: Patient limited by pain Patient left: in chair;with call bell/phone within reach;with chair alarm set;with family/visitor present Nurse Communication: Mobility status PT Visit Diagnosis: Unsteadiness on feet (R26.81);Other abnormalities of gait and mobility (R26.89);Muscle weakness (generalized) (M62.81);Difficulty in walking, not elsewhere classified (R26.2);Pain Pain - Right/Left: Left Pain - part of body: Arm     Time: 7622-6333 PT Time Calculation (min) (ACUTE ONLY): 42 min  Charges:  $Therapeutic Activity: 8-22 mins                     Moishe Spice, PT, DPT  Acute Rehabilitation Services  Pager: [phone] Office: [place] 10/29/2020, 6:20 PM

## 2020-10-29 NOTE — Progress Notes (Signed)
PROGRESS NOTE    [name]  FKC:[id number] DOB: 16-May-1946 DOA: 10/26/2020 PCP: Lajean Manes, MD    Brief Narrative:  75 year old F with PMH of ESRD on HD TTS, aortic valve stenosis, paroxysmal A. fib on Eliquis, malfunctioning left arm aVF and debility presented to ED with progressive LUE pain and numbness due to bleeding from malfunctioning left arm aVF with hematoma, and admitted for compartment syndrome.    Patient underwent emergent evacuation of LUE hematoma and repair of left brachial artery pseudoaneurysm by Dr. Donzetta Matters with significant improvement in her pain and near resolution of hyperesthesia. Received total 2 units of PRBC.  Hemoglobin 7.9.   Assessment & Plan:   Active Problems:   Compartment syndrome (HCC)  Left upper extremity compartment syndrome due to brachial artery pseudoaneurysm/malfunctioning AV fistula: Status postrepair and evacuation.  Clinically improving.  Continue elevation. Rest to the fistula and using permacath.  Pain controlled.  Mobility.  As per surgery.  ESRD on hemodialysis TTS: Received permacath 5/10.  Dialysis 5/10 and 5/12. Followed by nephrology.  Chronic diastolic congestive heart failure: Fairly euvolemic.  Fluid management with dialysis.  Acute blood loss anemia with anemia of chronic disease of ESRD: Recent on hemoglobin 8.4-6.4-1 unit of PRBC 5/10-6.5-1 unit of PRBC-7.9.  Recheck tomorrow morning. No current evidence of active bleeding.  Paroxysmal A. fib: Currently sinus rhythm.  On amiodarone.  Eliquis on hold.  Will resume once hemoglobin stabilizes.  Debility: Wheelchair-bound and walks with a walker at baseline.  Continue to work with PT OT today.  Start mobilizing and work with therapies again before discharge planning.   DVT prophylaxis: Place and maintain sequential compression device Start: 10/27/20 2155   Code Status: Full code Family Communication: None at the bedside, unable to talk to daughter on the phone.   Disposition Plan: Status is: Inpatient  Remains inpatient appropriate because:Inpatient level of care appropriate due to severity of illness   Dispo:  Patient From: Home  Planned Disposition: Home with home health vs CIR  Medically stable for discharge: yes        Consultants:   Vascular surgery  Nephrology  Procedures:   Permacath 5/10  Clot evacuation left AV fistula 5/10  Antimicrobials:   None   Subjective: Patient seen and examined.  Feels weak and debilitated.  Currently receiving hemodialysis. I talked to her about discharge, going to rehab.  Patient is against going to any facilities.  She wants to recover in the hospital over the weekend and wants to go home. I told her that we need to continue to work with therapist and make a discharge planning when no more surgical issues. CIR will talk to her and offer rehab.  She will continue to work with therapist today after dialysis. Arm pain is better, however unable to use it to use a walker due to weakness.  Objective: Vitals:   10/29/20 0802 10/29/20 0807 10/29/20 0830 10/29/20 0900  BP: (!) 147/61 (!) 141/70 120/71 (!) 118/51  Pulse: 72 71 73 70  Resp:      Temp:      TempSrc:      SpO2:      Weight:      Height:        Intake/Output Summary (Last 24 hours) at 10/29/2020 0943 Last data filed at 10/28/2020 1405 Gross per 24 hour  Intake 1070 ml  Output --  Net 1070 ml   Filed Weights   10/27/20 1300 10/27/20 1608 10/29/20 0754  Weight: 77.3  kg 75 kg 75.5 kg    Examination:  General exam: Appears calm and comfortable, on room air. Anxious looking.  Flat affect. Respiratory system: Clear to auscultation. Respiratory effort normal.  No added sounds. Permacath right chest wall. Ecchymosis all around the skin. Cardiovascular system: S1 & S2 heard, RRR.  Gastrointestinal system: Abdomen is nondistended, soft and nontender. No organomegaly or masses felt. Normal bowel sounds heard. Central nervous  system: Alert and oriented. No focal neurological deficits. Extremities: Symmetric 5 x 5 power. Left upper extremity dressing with Ace wrap, distal neurovascular status intact.    Data Reviewed: I have personally reviewed following labs and imaging studies  CBC: Recent Labs  Lab 10/22/20 2350 10/26/20 2303 10/27/20 0854 10/27/20 1107 10/28/20 0457 10/28/20 1620 10/29/20 0741  WBC 7.7 14.6* 11.3* 10.3 9.7  --  11.6*  NEUTROABS 6.4  --   --   --   --   --   --   HGB 8.4* 8.5* 6.4* 6.3* 6.5* 8.1* 7.8*  HCT 26.0* 27.5* 19.6* 19.8* 19.9* 23.8* 23.3*  MCV 90.3 96.5 92.0 92.5 89.6  --  87.9  PLT 177 287 190 192 182  --  009   Basic Metabolic Panel: Recent Labs  Lab 10/26/20 2303 10/27/20 0854 10/28/20 0457 10/29/20 0741  NA 136 136 135 134*  K 3.5 3.6 3.7 3.8  CL 97* 97* 99 99  CO2 28 29 30 29   GLUCOSE 108* 109* 93 85  BUN 19 21 16  27*  CREATININE 4.27* 4.53* 3.36* 4.66*  CALCIUM 8.5* 8.3* 7.9* 8.1*  MG  --  2.0 1.7  --   PHOS  --  3.6 3.1 2.4*   GFR: Estimated Creatinine Clearance: 10.1 mL/min (A) (by C-G formula based on SCr of 4.66 mg/dL (H)). Liver Function Tests: Recent Labs  Lab 10/26/20 2303 10/27/20 0854 10/28/20 0457 10/29/20 0741  AST 23 22  --   --   ALT 12 12  --   --   ALKPHOS 97 74  --   --   BILITOT 1.5* 1.5*  --   --   PROT 4.5* 4.6*  --   --   ALBUMIN 2.0* 2.6* 1.8* 1.6*   No results for input(s): LIPASE, AMYLASE in the last 168 hours. No results for input(s): AMMONIA in the last 168 hours. Coagulation Profile: Recent Labs  Lab 10/27/20 0854  INR 1.8*   Cardiac Enzymes: No results for input(s): CKTOTAL, CKMB, CKMBINDEX, TROPONINI in the last 168 hours. BNP (last 3 results) No results for input(s): PROBNP in the last 8760 hours. HbA1C: No results for input(s): HGBA1C in the last 72 hours. CBG: No results for input(s): GLUCAP in the last 168 hours. Lipid Profile: No results for input(s): CHOL, HDL, LDLCALC, TRIG, CHOLHDL, LDLDIRECT  in the last 72 hours. Thyroid Function Tests: No results for input(s): TSH, T4TOTAL, FREET4, T3FREE, THYROIDAB in the last 72 hours. Anemia Panel: No results for input(s): VITAMINB12, FOLATE, FERRITIN, TIBC, IRON, RETICCTPCT in the last 72 hours. Sepsis Labs: No results for input(s): PROCALCITON, LATICACIDVEN in the last 168 hours.  Recent Results (from the past 240 hour(s))  Resp Panel by RT-PCR (Flu A&B, Covid) Nasopharyngeal Swab     Status: None   Collection Time: 10/27/20  2:44 AM   Specimen: Nasopharyngeal Swab; Nasopharyngeal(NP) swabs in vial transport medium  Result Value Ref Range Status   SARS Coronavirus 2 by RT PCR NEGATIVE NEGATIVE Final    Comment: (NOTE) SARS-CoV-2 target nucleic acids are NOT  DETECTED.  The SARS-CoV-2 RNA is generally detectable in upper respiratory specimens during the acute phase of infection. The lowest concentration of SARS-CoV-2 viral copies this assay can detect is 138 copies/mL. A negative result does not preclude SARS-Cov-2 infection and should not be used as the sole basis for treatment or other patient management decisions. A negative result may occur with  improper specimen collection/handling, submission of specimen other than nasopharyngeal swab, presence of viral mutation(s) within the areas targeted by this assay, and inadequate number of viral copies(<138 copies/mL). A negative result must be combined with clinical observations, patient history, and epidemiological information. The expected result is Negative.  Fact Sheet for Patients:  EntrepreneurPulse.com.au  Fact Sheet for Healthcare Providers:  IncredibleEmployment.be  This test is no t yet approved or cleared by the [place] FDA and  has been authorized for detection and/or diagnosis of SARS-CoV-2 by FDA under an Emergency Use Authorization (EUA). This EUA will remain  in effect (meaning this test can be used) for the duration of  the COVID-19 declaration under Section 564(b)(1) of the Act, 21 U.S.C.section 360bbb-3(b)(1), unless the authorization is terminated  or revoked sooner.       Influenza A by PCR NEGATIVE NEGATIVE Final   Influenza B by PCR NEGATIVE NEGATIVE Final    Comment: (NOTE) The Xpert Xpress SARS-CoV-2/FLU/RSV plus assay is intended as an aid in the diagnosis of influenza from Nasopharyngeal swab specimens and should not be used as a sole basis for treatment. Nasal washings and aspirates are unacceptable for Xpert Xpress SARS-CoV-2/FLU/RSV testing.  Fact Sheet for Patients: EntrepreneurPulse.com.au  Fact Sheet for Healthcare Providers: IncredibleEmployment.be  This test is not yet approved or cleared by the [place] FDA and has been authorized for detection and/or diagnosis of SARS-CoV-2 by FDA under an Emergency Use Authorization (EUA). This EUA will remain in effect (meaning this test can be used) for the duration of the COVID-19 declaration under Section 564(b)(1) of the Act, 21 U.S.C. section 360bbb-3(b)(1), unless the authorization is terminated or revoked.  Performed at [facility], [place] [address] [place], [place] [address]   MRSA PCR Screening     Status: None   Collection Time: 10/27/20  8:22 AM   Specimen: Nasal Mucosa; Nasopharyngeal  Result Value Ref Range Status   MRSA by PCR NEGATIVE NEGATIVE Final    Comment:        The GeneXpert MRSA Assay (FDA approved for NASAL specimens only), is one component of a comprehensive MRSA colonization surveillance program. It is not intended to diagnose MRSA infection nor to guide or monitor treatment for MRSA infections. Performed at [facility], [place] [address] [place], [place] [address]          Radiology Studies: No results found.      Scheduled Meds: . amiodarone  200 mg Oral Daily  . ondansetron  4 mg Oral Once  . senna-docusate  2 tablet Oral QHS  .  timolol  1 drop Both Eyes Daily   Continuous Infusions: . [START ON 10/30/2020] sodium chloride    . [START ON 10/30/2020] sodium chloride       LOS: 2 days    Time spent: 30 minutes    Barb Merino, MD [facility] Pager [phone]

## 2020-10-29 NOTE — Plan of Care (Signed)
?  Problem: Clinical Measurements: ?Goal: Ability to maintain clinical measurements within normal limits will improve ?Outcome: Progressing ?Goal: Will remain free from infection ?Outcome: Progressing ?Goal: Diagnostic test results will improve ?Outcome: Progressing ?  ?

## 2020-10-29 NOTE — Progress Notes (Addendum)
[facility] Progress Note   Subjective:  Seen in HD unit. UF goal 3L Tolerating so far Feels about the same. Pain controlled May do inpatient rehab.   Objective Vitals:   10/29/20 0900 10/29/20 0930 10/29/20 1000 10/29/20 1030  BP: (!) 118/51 (!) 121/53 (!) 109/58 (!) 109/47  Pulse: 70 71 72 69  Resp:      Temp:      TempSrc:      SpO2:      Weight:      Height:         Additional Objective Labs: Basic Metabolic Panel: Recent Labs  Lab 10/27/20 0854 10/28/20 0457 10/29/20 0741  NA 136 135 134*  K 3.6 3.7 3.8  CL 97* 99 99  CO2 29 30 29   GLUCOSE 109* 93 85  BUN 21 16 27*  CREATININE 4.53* 3.36* 4.66*  CALCIUM 8.3* 7.9* 8.1*  PHOS 3.6 3.1 2.4*   CBC: Recent Labs  Lab 10/22/20 2350 10/26/20 2303 10/27/20 0854 10/27/20 1107 10/28/20 0457 10/28/20 1620 10/29/20 0741  WBC 7.7 14.6* 11.3* 10.3 9.7  --  11.6*  NEUTROABS 6.4  --   --   --   --   --   --   HGB 8.4* 8.5* 6.4* 6.3* 6.5* 8.1* 7.8*  HCT 26.0* 27.5* 19.6* 19.8* 19.9* 23.8* 23.3*  MCV 90.3 96.5 92.0 92.5 89.6  --  87.9  PLT 177 287 190 192 182  --  209   Blood Culture    Component Value Date/Time   SDES URINE, RANDOM 07/17/2020 1500   SPECREQUEST  07/17/2020 1500    NONE Performed at [facility], [place] [address] [place], [place] [address]    CULT MULTIPLE SPECIES PRESENT, SUGGEST RECOLLECTION (A) 07/17/2020 1500   REPTSTATUS 07/18/2020 FINAL 07/17/2020 1500     Physical Exam General: Well appearing, nad  Heart: RRR Lungs: Clear, bilaterally  Abdomen: soft non-tender  Extremities: Trace LE edema  Dialysis Access: R IJ TDC; LUE AVF bandaged   Medications: . [START ON 10/30/2020] sodium chloride    . [START ON 10/30/2020] sodium chloride     . amiodarone  200 mg Oral Daily  . ondansetron  4 mg Oral Once  . senna-docusate  2 tablet Oral QHS  . timolol  1 drop Both Eyes Daily    [facility] TTS  4h 400/500 EDW 71kg 2K/2Ca UFP 2 No heparin  -Mircera 75 q 2 wks (last 5/5)  No VDRA    Assessment/Plan: 1. LUE hematoma: s/p evacuation 5/10 per Dr. Cain/VVS  2. ESRD -  HD TTS; Continue HD on schedule. HD today.   3. Hypertension/volume  - BP controlled. No BP meds on home list.  Weights up. UF to EDW as tolerated.  4. Anemia of CKD/ABLA   - Hgb 6.4>6.5 post op. Improved to 7.8 s/p 1 unit prbcs 5/10. Recent ESA dose as outpatient. Follow trends. Transfuse prn.  5. Metabolic bone disease -  Ca/Phos ok. No binders or VDRA   6. Chronic combined CHF -- volume removal with UF on HD  7. PAFib- On Eliquis/amiodarone  8. Nutrition - Renal diet/fluid restriction. Prot supp for low albumin   Lynnda Child PA-C [place] 10/29/2020,10:42 AM  Seen and examined independently.  Agree with note and exam as documented above by physician extender and as noted here.  General adult female in bed in no acute distress HEENT normocephalic atraumatic extraocular movements intact sclera anicteric Neck supple trachea midline  Lungs clear to auscultation bilaterally normal work of breathing at rest  Heart regular rate and rhythm no rubs or gallops appreciated Abdomen soft nontender nondistended Extremities 1+ edema  Psych normal mood and affect Neuro - alert and oriented x 3 provides hx and follows commands Access Left AVF with bruit; arm wrapped   See also HD note from today.   LUE hematoma - appreciate vascular  ESRD on HD per TTS schedule   Anemia of acute blood loss and CKD - s/p PRBC's yesterday.  Follow trends for redosing ESA - recent dose as outpatient on 5/5  Metabolic bone disease - phos a bit low - not on binders here and would remain off.   Claudia Desanctis, MD 10/29/2020  11:00 AM

## 2020-10-29 NOTE — Evaluation (Signed)
Occupational Therapy Evaluation Patient Details Name: [name] MRN: [id number] DOB: Jan 03, 1946 Today's Date: 10/29/2020    History of Present Illness Pt is a 75 y.o. female who presented 5/9 with progressively worsening L UE pain with hematoma and numbness. CT left upper extremity without contrast showed findings with concern for compartment syndrome. S/p evacuation of L UE hematoma and primary repair on L brachial artery pseudoaneurysm 5/10. PMH: ESRD HD TTS, aortic valve stenosis paroxysmal A. fib on Eliquis, hx of MI, HTN, glaucoma, GERD, CHF, and malfunctioning left-sided fistula.   Clinical Impression   This 75 yo female admitted and underwent above presents to acute OT with PLOF of needing min A from dtr for basic ADLs at a RW level. Currently pt is min A-total A for basic ADLs due to decreased balance, decreased use of LUE, and pain in LUE. She will continue to benefit from acute OT with follow up on CIR to get back to her min A level or better overall.    Follow Up Recommendations  CIR;Supervision/Assistance - 24 hour    Equipment Recommendations  Other (comment) (TBD next venue)       Precautions / Restrictions Precautions Precautions: Fall Precaution Comments: LUE has staples on inside of upper arm and skin tear on bottom part of arm Restrictions Weight Bearing Restrictions: No      Mobility Bed Mobility Overal bed mobility: Needs Assistance Bed Mobility: Supine to Sit     Supine to sit: Mod assist;+2 for physical assistance     General bed mobility comments: HOB flat, A at legs and trunk    Transfers Overall transfer level: Needs assistance Equipment used: Rolling walker (2 wheeled) Transfers: Sit to/from Stand           General transfer comment: Min A +2 sit<>stand from raised bed with gait belt, Mod A +2 sit<>stand from recliner with use of gait belt and chair pad and momentum    Balance Overall balance assessment: Needs assistance Sitting-balance  support: No upper extremity supported;Feet supported Sitting balance-Leahy Scale: Fair     Standing balance support: Bilateral upper extremity supported Standing balance-Leahy Scale: Poor Standing balance comment: Reliant on bil UE and external assist.                           ADL either performed or assessed with clinical judgement   ADL Overall ADL's : Needs assistance/impaired Eating/Feeding: Set up;Sitting Eating/Feeding Details (indicate cue type and reason): in recliner Grooming: Minimal assistance;Sitting Grooming Details (indicate cue type and reason): in recliner Upper Body Bathing: Moderate assistance;Sitting Upper Body Bathing Details (indicate cue type and reason): in recliner Lower Body Bathing: Maximal assistance Lower Body Bathing Details (indicate cue type and reason): Mod A +2 sit<>stand from recliner (using gait belt and chair pad) Upper Body Dressing : Maximal assistance;Sitting Upper Body Dressing Details (indicate cue type and reason): in recliner Lower Body Dressing: Total assistance Lower Body Dressing Details (indicate cue type and reason): Mod A +2 sit<>stand from recliner (using gait belt and chair pad)   Toilet Transfer Details (indicate cue type and reason): min A +2 sit<>stand from raised bed>min A +2 ambulation RE 8 feet. Mod A +2 sit<>stand from recliner Toileting- Clothing Manipulation and Hygiene: Total assistance Toileting - Clothing Manipulation Details (indicate cue type and reason): Mod A +2 sit<>stand from recliner (using gait belt and chair pad)             Vision Patient  Visual Report: No change from baseline              Pertinent Vitals/Pain Pain Assessment: Faces Faces Pain Scale: Hurts little more Pain Location: left hand (pins and needles) Pain Descriptors / Indicators: Numbness;Tingling Pain Intervention(s): Limited activity within patient's tolerance;Monitored during session;Repositioned     Hand Dominance  Right   Extremity/Trunk Assessment Upper Extremity Assessment Upper Extremity Assessment: LUE deficits/detail LUE Deficits / Details: Decreased strength and A/AA/PROM; ACE wrap removed by surgeon in room giving pt more ability for AROM in hand, wrist, forearm and elbow LUE Coordination: decreased fine motor;decreased gross motor           Communication Communication Communication: No difficulties   Cognition Arousal/Alertness: Awake/alert Behavior During Therapy: Flat affect Overall Cognitive Status: Within Functional Limits for tasks assessed                                        Exercises Other Exercises Other Exercises: Educated pt and dtr on LUE exercises for hand and elbow alternating them during commericals        Home Living Family/patient expects to be discharged to:: Inpatient rehab Living Arrangements: Children (daughter) Available Help at Discharge: Family;Available 24 hours/day Type of Home: House Home Access: Stairs to enter [employer] of Steps: 2 Entrance Stairs-Rails: None Home Layout: One level     Bathroom Shower/Tub: Teacher, early years/pre: Handicapped height     Home Equipment: Grab bars - tub/shower;Walker - 4 wheels;Cane - quad;Cane - single point;Wheelchair - [employer] - 2 wheels          Prior Functioning/Environment Level of Independence: Needs assistance  Gait / Transfers Assistance Needed: Prior to fall in December and broke her hip, she was independent. Since then she has been utilizing her manual w/c and RW, walking up to ~30 ft with min guard. Pt independent with w/c. Pt needing assistance to power up to stand and occasionally to perform bed mobility. Daughter pushes pt in w/c up and down the stairs. ADL's / Homemaking Assistance Needed: Daughter assists with bathing and donning/doffing socks/shoes. Daughter provides transportation, cleans, cooks, and assists with financial and medication  management.            OT Problem List: Decreased strength;Decreased range of motion;Impaired balance (sitting and/or standing);Impaired UE functional use;Pain      OT Treatment/Interventions: Self-care/ADL training;DME and/or AE instruction;Balance training;Patient/family education;Therapeutic activities;Therapeutic exercise    OT Goals(Current goals can be found in the care plan section) Acute Rehab OT Goals Patient Stated Goal: to go to rehab here in hospital and then home OT Goal Formulation: With patient/family Time For Goal Achievement: 11/12/20 Potential to Achieve Goals: Good  OT Frequency: Min 2X/week              AM-PAC OT "6 Clicks" Daily Activity     Outcome Measure Help from another person eating meals?: A Little Help from another person taking care of personal grooming?: A Little Help from another person toileting, which includes using toliet, bedpan, or urinal?: A Lot Help from another person bathing (including washing, rinsing, drying)?: A Lot Help from another person to put on and taking off regular upper body clothing?: A Lot Help from another person to put on and taking off regular lower body clothing?: Total 6 Click Score: 13   End of Session Equipment Utilized During Treatment: Gait belt;Rolling walker Nurse  Communication: Mobility status (made NT aware)  Activity Tolerance: Patient tolerated treatment well Patient left: in chair;with call bell/phone within reach;with chair alarm set  OT Visit Diagnosis: Unsteadiness on feet (R26.81);Other abnormalities of gait and mobility (R26.89);Muscle weakness (generalized) (M62.81);Pain Pain - Right/Left: Left Pain - part of body: Arm                Time: 4175-3010 OT Time Calculation (min): 42 min Charges:  OT General Charges $OT Visit: 1 Visit OT Evaluation $OT Eval Moderate Complexity: 1 Mod OT Treatments $Self Care/Home Management : 8-22 mins  [address] [address], OTR/L Acute [employer] Pager  [phone] Office [phone]     Almon Register 10/29/2020, 2:32 PM

## 2020-10-29 NOTE — Progress Notes (Signed)
Inpatient Rehab Admissions Coordinator Note:   Per therapy recommendations, pt was screened for CIR candidacy by Shann Medal, PT, DPT.  At this time we are recommending a CIR consult and I will place an order per our protocol.  Please contact me with questions.   Shann Medal, PT, DPT [phone] 10/29/20 9:31 AM

## 2020-10-29 NOTE — Progress Notes (Addendum)
Inpatient Rehabilitation Admissions Coordinator  Inpatient rehab consult received. I met with patient at bedside for rehab assessment. Patient previously at [facility] 2013 and prefers another CIR admit if possible. We discussed goals and expectations of a possible CIR admit. I will begin insurance Auth once I have updated PT notes,  for a possible admit pending insurance approval and bed availability. Patient was at [facility] and does not want SNF if possible.  Danne Baxter, RN, MSN Rehab Admissions Coordinator [phone] 10/29/2020 3:33 PM

## 2020-10-29 NOTE — Procedures (Signed)
Seen and examined on dialysis.  Procedure supervised.  Blood pressure 120/71 and HR 73. RIJ tunn cath.  Tolerating goal.  Increased goal by 0.5 kg as tolerated.  Goal for BP above 811 systolic.  LUE AVF bruit arm wrapped.   Claudia Desanctis, MD 10/29/2020 8:35 AM

## 2020-10-29 NOTE — Progress Notes (Signed)
Nurse gave report to dialysis RN.

## 2020-10-30 MED ORDER — CEPHALEXIN 250 MG PO CAPS
250.0000 mg | ORAL_CAPSULE | Freq: Every day | ORAL | Status: DC
  Administered 2020-10-30 – 2020-11-02 (×4): 250 mg via ORAL
  Filled 2020-10-30 (×5): qty 1

## 2020-10-30 MED ORDER — LORAZEPAM 0.5 MG PO TABS: 0.5000 mg | ORAL_TABLET | Freq: Every day | ORAL | Status: DC | PRN

## 2020-10-30 NOTE — Care Management Important Message (Signed)
Important Message  Patient Details  Name: [name] MRN: [id number] Date of Birth: 05/18/1946   Medicare Important Message Given:  Yes     Orbie Pyo 10/30/2020, 2:08 PM

## 2020-10-30 NOTE — Plan of Care (Addendum)
Patient and her daughter says she is on Keflex once a day 250 mg. Sent dr. Eston Esters and they said they will review and take care of it.

## 2020-10-30 NOTE — PMR Pre-admission (Signed)
PMR Admission Coordinator Pre-Admission Assessment  Patient: [name] is an 75 y.o., female MRN: [id number] DOB: May 08, 1946 Height: $RemoveBefo'5\' 3"'tRYITVmUgmv$  (160 cm) Weight: 75.5 kg  Insurance Information HMO: yes    PPO:      PCP:      IPA:      80/20:      OTHER:  PRIMARY: Blue Medicare      Policy#: [id number]      Subscriber: pt CM Name: [name]      Phone#: [phone]     Fax#: [phone] Pre-Cert#: TBD   Approved for 7 days   Employer:  Benefits:  Phone #: [phone]     Name: 5/13 Eff. Date: 06/20/2020     Deduct: none      Out of Pocket Max: $4200      Life Max: none CIR: $$335 co pay per day days 1 until 6      SNF: $188 co pay per day  Outpatient: $40 co pay per visit     Co-Pay: visits per medical neccesity Home Health: 100%      Co-Pay: visits per medical neccesity DME: 80%     Co-Pay: 20% Providers: in network  SECONDARY: none      Policy#:      Phone#:   Development worker, community:       Phone#:   The Engineer, [employer]" for patients in Inpatient Rehabilitation Facilities with attached "Privacy Act [place] Records" was provided and verbally reviewed with: Patient  Emergency Contact Information Contact Information    Name Relation Home Work [place] Daughter   [phone]      Current Medical History  Patient Admitting Diagnosis: compartmental syndrome, debility  History of Present Illness: 75 year old female with history of ESRD on Hemodialysis, combined systolic/diastolic HF,  PAF on AC, HTN and glaucoma. Presented 10/26/2020 to ED with worsening swelling and pain in Left upper extremity. Her LUE AVF was infiltrated on dialysis on 5/3. Swelling worsened and she was sent for placement of tunneled hemodialysis catheter to allow her fistula to rest. She has developed loss of sensation to her fingers. Large intramuscular biceps hematoma on CT with concern for compartment syndrome. Vascular consulted and she underwent evacuation of  hematoma  And repair of left brachial artery pseudoaneurysm by Dr. Donzetta Matters on 10/27/2020. Hgb 6.6 and received transfusions.   Patient's medical record from [facility]  has been reviewed by the rehabilitation admission coordinator and physician.  Past Medical History  Past Medical History:  Diagnosis Date  . Arthritis of left knee   . Cardiomyopathy ([place])    a. h/o LV dysfunction EF 20-25% in 2013 due to sepsis.>> improved to normal   . Chronic diastolic CHF (congestive heart failure) ([place])    10/ 2013 in setting of septic shock  . Complication of anesthesia    use a little anesthesia , per patient MD states she quit breathing (2016); hard to wake up  . ESRD (end stage renal disease) ([place])    dialysis Tues, Thurs, Sat [address], sees dr deterding   . GERD (gastroesophageal reflux disease)   . Glaucoma    both eyes  . H/O hiatal hernia    a. CT 2017: large gastric hiatal hernia.  Marland Kitchen Headache(784.0)    migraine hx of  . History of blood transfusion 04/13/2015   . History of echocardiogram    a. Echo 6/17: EF 60-65%, normal wall motion, mild MR, atrial septal lipomatous hypertrophy,  PASP 34 mmHg, possible trivial free-flowing pericardial effusion along RV free wall // b. Echo 5/17: Mild LVH, EF 55-60%, normal wall motion, grade 1 diastolic dysfunction, trivial MR, severe LAE, mild RAE, PASP 42 mmHg  . History of kidney stones    10/18/2019: per patient "has a couple currently one in each kidney"  . History of nephrostomy 04/11/2015   currently inplace 04/28/2015  removed now  . History of nuclear stress test    a. Myoview 1/14 - Marked ischemia in the basal anterior, mid anterior, apical septal and apical inferior regions, EF 63% >> LHC normal   . Hyperlipidemia   . Hypertension    medication removed from regimen due to low blood pressure   . Iron deficiency    hx  . Myocardial infarction [facility]) 2013   10/18/2019: per patient "in 2013)  . Nephrolithiasis 2002, 2006   bilateral   . Normal coronary arteries 2014   a. LHC in 1/14: normal coronary arteries  . PAF (paroxysmal atrial fibrillation) ([place])    a. 10/ 2013  in setting of Septic Shock //  b. recurrent during admit for pneumonia, L effusion >> placed on Amiodarone // Coumadin for anticoagulation  . Pneumonia jan 2018, last tme lungs clear now   dx 10-06-2014 per CXR--  on 10-27-2014 pt states finished antibiotic and denies cough or fever  . Primary localized osteoarthritis of right hip 10/22/2019  . S/P hemodialysis catheter insertion ([place]) 04/11/2015    right anterior chest , only used once   . Sigmoid diverticulosis   . SOB (shortness of breath) on exertion    10/18/2019: per patient "get short of breath with activity sometimes due to heart valve"  . UTI (urinary tract infection) 05/10/2016    Family History   family history includes Bladder Cancer in her maternal grandfather; Cancer (age of onset: 54) in her father; Cancer (age of onset: 21) in her mother; Dementia in her mother; Diabetes in her brother; Heart disease in her brother; Heart failure in her paternal grandmother; Hypertension in her brother and mother.  Prior Rehab/Hospitalizations Has the patient had prior rehab or hospitalizations prior to admission? Yes   CIR 2013 [facility] 12/21 after hip fx  Has the patient had major surgery during 100 days prior to admission? Yes   Current Medications  Current Facility-Administered Medications:  .  (feeding supplement) PROSource Plus liquid 30 mL, 30 mL, Oral, BID BM, Ejigiri, Ogechi Grace, PA-C, 30 mL at 11/02/20 1410 .  0.9 %  sodium chloride infusion, 100 mL, Intravenous, PRN, Penninger, Ria Comment, PA .  0.9 %  sodium chloride infusion, 100 mL, Intravenous, PRN, Penninger, Ria Comment, PA .  acetaminophen (TYLENOL) tablet 650 mg, 650 mg, Oral, Q6H PRN, Kayleen Memos, DO, 650 mg at 11/03/20 8889 .  alteplase (CATHFLO ACTIVASE) injection 2 mg, 2 mg, Intracatheter, Once PRN, Penninger, Ria Comment,  PA .  amiodarone (PACERONE) tablet 200 mg, 200 mg, Oral, Daily, Hall, Carole N, DO, 200 mg at 11/02/20 0933 .  apixaban (ELIQUIS) tablet 2.5 mg, 2.5 mg, Oral, BID, Barb Merino, MD, 2.5 mg at 11/02/20 2306 .  cephALEXin (KEFLEX) capsule 250 mg, 250 mg, Oral, Daily, Barb Merino, MD, 250 mg at 11/02/20 0934 .  [START ON 11/05/2020] Darbepoetin Alfa (ARANESP) injection 100 mcg, 100 mcg, Intravenous, Q Thu-HD, Penninger, Lindsay, PA .  famotidine (PEPCID) tablet 20 mg, 20 mg, Oral, BID, Ghimire, Kuber, MD, 20 mg at 11/02/20 1814 .  heparin injection 1,000 Units, 1,000 Units,  Dialysis, PRN, Penninger, [place], [place] .  HYDROmorphone (DILAUDID) injection 0.5 mg, 0.5 mg, Intravenous, Q4H PRN, Irene Pap N, DO, 0.5 mg at 11/01/20 0951 .  latanoprost (XALATAN) 0.005 % ophthalmic solution 1 drop, 1 drop, Both Eyes, QHS, Ghimire, Kuber, MD, 1 drop at 11/02/20 2306 .  lidocaine (PF) (XYLOCAINE) 1 % injection 5 mL, 5 mL, Intradermal, PRN, Penninger, Ria Comment, PA .  lidocaine-prilocaine (EMLA) cream 1 application, 1 application, Topical, PRN, Penninger, Lindsay, PA .  loperamide (IMODIUM) capsule 2 mg, 2 mg, Oral, Q4H PRN, Barb Merino, MD, 2 mg at 11/02/20 2324 .  LORazepam (ATIVAN) tablet 0.5 mg, 0.5 mg, Oral, Daily PRN, Barb Merino, MD .  metoprolol tartrate (LOPRESSOR) injection 2.5 mg, 2.5 mg, Intravenous, Q8H PRN, Hall, Carole N, DO .  ondansetron (ZOFRAN-ODT) disintegrating tablet 4 mg, 4 mg, Oral, Once, Trifan, Carola Rhine, MD .  oxyCODONE (Oxy IR/ROXICODONE) immediate release tablet 5 mg, 5 mg, Oral, Q6H PRN, Irene Pap N, DO, 5 mg at 11/03/20 0537 .  pentafluoroprop-tetrafluoroeth (GEBAUERS) aerosol 1 application, 1 application, Topical, PRN, Penninger, Lindsay, PA .  prochlorperazine (COMPAZINE) injection 10 mg, 10 mg, Intravenous, Q6H PRN, Hall, Carole N, DO, 10 mg at 11/02/20 1104 .  senna-docusate (Senokot-S) tablet 2 tablet, 2 tablet, Oral, QHS, Hall, Carole N, DO, 2 tablet at 10/31/20  2333 .  timolol (TIMOPTIC) 0.5 % ophthalmic solution 1 drop, 1 drop, Both Eyes, Daily, Hall, Rockleigh N, DO, 1 drop at 11/02/20 0934  Patients Current Diet:  Diet Order            Diet renal with fluid restriction Fluid restriction: 1200 mL Fluid; Room service appropriate? Yes; Fluid consistency: Thin  Diet effective now                 Precautions / Restrictions Precautions Precautions: Fall Precaution Comments: LUE has staples on inside of upper arm and skin tear on bottom part of arm Restrictions Weight Bearing Restrictions: No   Has the patient had 2 or more falls or a fall with injury in the past year? Yes  Prior Activity Level Limited Community (1-2x/wk): supervision short distance since 12/21 after hip fx  Prior Functional Level Self Care: Did the patient need help bathing, dressing, using the toilet or eating? Needed some help  Indoor Mobility: Did the patient need assistance with walking from room to room (with or without device)? Needed some help  Stairs: Did the patient need assistance with internal or external stairs (with or without device)? Needed some help  Functional Cognition: Did the patient need help planning regular tasks such as shopping or remembering to take medications? Independent  Home Assistive Devices / Equipment Home Equipment: Grab bars - tub/shower,Walker - 4 wheels,Cane - quad,Cane - single point,Wheelchair - manual,Walker - 2 wheels  Prior Device Use: Indicate devices/aids used by the patient prior to current illness, exacerbation or injury? Manual wheelchair and Walker  Current Functional Level Cognition  Overall Cognitive Status: Within Functional Limits for tasks assessed Orientation Level: Oriented X4 General Comments: A&Ox4.    Extremity Assessment (includes Sensation/Coordination)  Upper Extremity Assessment: LUE deficits/detail LUE Deficits / Details: Decreased strength and A/AA/PROM; ACE wrap removed by surgeon in room giving pt  more ability for AROM in hand, wrist, forearm and elbow LUE Coordination: decreased fine motor,decreased gross motor  Lower Extremity Assessment: Generalized weakness (stiffness noted in bil legs, denies numbness/tingling)    ADLs  Overall ADL's : Needs assistance/impaired Eating/Feeding: Set up,Sitting Eating/Feeding Details (indicate cue type and  reason): encouraged pt to use LUE to reach for items on tray and use L hand as stabilize while R hand completes precision task of opening items Grooming: Minimal assistance,Sitting Grooming Details (indicate cue type and reason): in recliner Upper Body Bathing: Moderate assistance,Sitting Upper Body Bathing Details (indicate cue type and reason): in recliner Lower Body Bathing: Maximal assistance Lower Body Bathing Details (indicate cue type and reason): Mod A +2 sit<>stand from recliner (using gait belt and chair pad) Upper Body Dressing : Maximal assistance,Sitting Upper Body Dressing Details (indicate cue type and reason): in recliner Lower Body Dressing: Total assistance Lower Body Dressing Details (indicate cue type and reason): Mod A +2 sit<>stand from recliner (using gait belt and chair pad) Toilet Transfer Details (indicate cue type and reason): min A +2 sit<>stand from raised bed>min A +2 ambulation RE 8 feet. Mod A +2 sit<>stand from recliner Toileting- Clothing Manipulation and Hygiene: Total assistance Toileting - Clothing Manipulation Details (indicate cue type and reason): Mod A +2 sit<>stand from recliner (using gait belt and chair pad) General ADL Comments: session focused on LUE HEP. pt continues to be mostly limited by increased pain in LUE and decreased activity tolerance    Mobility  Overal bed mobility: Needs Assistance Bed Mobility: Supine to Sit,Sit to Supine Rolling: Min assist Supine to sit: Mod assist,+2 for physical assistance Sit to supine: +2 for physical assistance,Max assist General bed mobility comments: Pt able  to assist bring BLE's off edge of bed slowly, HHA to pull up with R hand to sit up. Assist for trunk guidance and BLE elevation back into bed    Transfers  Overall transfer level: Needs assistance Equipment used: Rolling walker (2 wheeled) Transfers: Sit to/from Stand Sit to Stand: Mod assist,+2 physical assistance,+2 safety/equipment  Lateral/Scoot Transfers: Mod assist General transfer comment: On first two attempts, pt only able to achieve minimal hip clearance. Successful to stand on last two trials, pushing both hands off edge of bed, bilateral feet blocked to prevent anterior slide. Tactile/verbal cues for upright posture, decreased left lateral lean, upward gaze. Able to take side steps with significant time/effort, cues for weight shifting. Significant trunk flexion. Pt reporting "my legs are giving out."    Ambulation / Gait / Stairs / Wheelchair Mobility  Ambulation/Gait Ambulation/Gait assistance: Min assist,+2 physical assistance,+2 safety/equipment Gait Distance (Feet): 7 Feet Assistive device: Rolling walker (2 wheeled) Gait Pattern/deviations: Step-through pattern,Decreased step length - right,Decreased step length - left,Decreased stride length,Decreased weight shift to right,Decreased weight shift to left,Trunk flexed General Gait Details: Ambulated anteriorly with minAx2 to steady, bring chair behind pt, and manage lines. Cues to shift weight and advance legs, with increased time and noted trunk sway. No appreciative knee buckling noted even though pt reported feeling like her legs were going to "give out". Gait velocity: reduced Gait velocity interpretation: <1.31 ft/sec, indicative of household ambulator    Posture / Balance Dynamic Sitting Balance Sitting balance - Comments: UE support sitting statically EOB, supervision for safety. Balance Overall balance assessment: Needs assistance Sitting-balance support: No upper extremity supported,Feet supported Sitting  balance-Leahy Scale: Fair Sitting balance - Comments: UE support sitting statically EOB, supervision for safety. Standing balance support: Bilateral upper extremity supported Standing balance-Leahy Scale: Poor Standing balance comment: Reliant on bil UE and external assist.    Special needs/care consideration ESRD on hemodialysis T TH Sat [facility] transportation Patient reports chronic diarrhea after last year's dx of c diff   Previous Home Environment  Living Arrangements: Children (  daughter)  Lives With: Daughter Available Help at Discharge: Family,Available 24 hours/day Type of Home: House Home Layout: One level Home Access: Stairs to enter Entrance Stairs-Rails: None Entrance Stairs-Number of Steps: 2 Bathroom Shower/Tub: Chiropodist: Handicapped height Bathroom Accessibility: Yes How Accessible: Accessible via walker Home Care Services: Yes Type of Home Care Services: Home PT  Discharge Living Setting Plans for Discharge Living Setting: Patient's home,Lives with (comment) (daughter) Type of Home at Discharge: House Discharge Home Layout: One level Discharge Home Access: Stairs to enter Entrance Stairs-Rails: None Entrance Stairs-Number of Steps: 2 Discharge Bathroom Shower/Tub: Tub/shower unit Discharge Bathroom Toilet: Handicapped height Discharge Bathroom Accessibility: Yes How Accessible: Accessible via walker Does the patient have any problems obtaining your medications?: No  Social/Family/Support Systems Contact Information: daughter, [name] Anticipated Caregiver: daughter Anticipated Ambulance person Information: see above Ability/Limitations of Caregiver: owns a pet sitting service Caregiver Availability: 24/7 Discharge Plan Discussed with Primary Caregiver: Yes Is Caregiver In Agreement with Plan?: Yes Does Caregiver/Family have Issues with Lodging/Transportation while Pt is in Rehab?: No  Goals Patient/Family Goal  for Rehab: Mod I to supervision PT, supervision to min OT Expected length of stay: ELOS 2 weeks Additional Information: ESRd on Hemodialysis T TH Sat Pt/Family Agrees to Admission and willing to participate: Yes Program Orientation Provided & Reviewed with Pt/Caregiver Including Roles  & Responsibilities: Yes  Decrease burden of Care through IP rehab admission: n/a  Possible need for SNF placement upon discharge: not anticipated  Patient Condition: I have reviewed medical records from [facility] , spoken with  patient. I met with patient at the bedside for inpatient rehabilitation assessment.  Patient will benefit from ongoing PT and OT, can actively participate in 3 hours of therapy a day 5 days of the week, and can make measurable gains during the admission.  Patient will also benefit from the coordinated team approach during an Inpatient Acute Rehabilitation admission.  The patient will receive intensive therapy as well as Rehabilitation physician, nursing, social worker, and care management interventions.  Due to bladder management, bowel management, safety, skin/wound care, disease management, medication administration, pain management and patient education the patient requires 24 hour a day rehabilitation nursing.  The patient is currently mod assist overall with mobility and basic ADLs.  Discharge setting and therapy post discharge at home with home health is anticipated.  Patient has agreed to participate in the Acute Inpatient Rehabilitation Program and will admit today.  Preadmission Screen Completed By:  Cleatrice Burke, 11/03/2020 10:06 AM ______________________________________________________________________   Discussed status with Dr. Dagoberto Ligas  on  11/03/2020 at  1008 and received approval for admission today.  Admission Coordinator:  Cleatrice Burke, RN, time  3646 Date  11/03/2020   Assessment/Plan: Diagnosis: 1. Does the need for close, 24 hr/day Medical  supervision in concert with the patient's rehab needs make it unreasonable for this patient to be served in a less intensive setting? Yes 2. Co-Morbidities requiring supervision/potential complications: ESRD on HD, CHF, HTN, ABLA on chronic anemia; PAF on AC 3. Due to bowel management, safety, skin/wound care, disease management, medication administration, pain management and patient education, does the patient require 24 hr/day rehab nursing? Yes 4. Does the patient require coordinated care of a physician, rehab nurse, PT, OT, and SLP to address physical and functional deficits in the context of the above medical diagnosis(es)? Yes Addressing deficits in the following areas: balance, endurance, locomotion, strength, transferring, bathing, dressing, feeding, grooming and toileting 5. Can the patient actively  participate in an intensive therapy program of at least 3 hrs of therapy 5 days a week? Yes 6. The potential for patient to make measurable gains while on inpatient rehab is good 7. Anticipated functional outcomes upon discharge from inpatient rehab: supervision and min assist PT, supervision and min assist OT, n/a SLP 8. Estimated rehab length of stay to reach the above functional goals is: 2 weeks 9. Anticipated discharge destination: Home 10. Overall Rehab/Functional Prognosis: good   MD Signature:

## 2020-10-30 NOTE — Progress Notes (Signed)
   Patient has persistent pain in her left upper extremity.  She has a palpable left ulnar artery pulse and her hand is warm and well-perfused.  Numbness is improving.  She has staples in place we will have her back in the office to remove in a couple weeks.  Vascular surgery available as needed.  Layla Kesling C. Donzetta Matters, MD [facility] Office: [phone] Pager: [phone]

## 2020-10-30 NOTE — Progress Notes (Signed)
Inpatient Rehabilitation Admissions Coordinator  I met with patient and her daughter at bedside. I discussed awaiting Blue medicare decision on possible CIR admit. If not approved, daughter may take directly home vs SNF preference of [facility] or [facility]. I will follow up one insurance has made a determination.  Danne Baxter, RN, MSN Rehab Admissions Coordinator [phone] 10/30/2020 1:10 PM

## 2020-10-30 NOTE — Progress Notes (Signed)
  [facility] Progress Note   Subjective:  Seen in room, no new c/o   Objective Vitals:   10/29/20 2321 10/30/20 0351 10/30/20 0755 10/30/20 1206  BP: (!) 115/47 120/63 (!) 148/52 (!) 152/73  Pulse: 69 66 74 70  Resp: 14 12 12 17   Temp: 98.4 F (36.9 C) (!) 97.5 F (36.4 C) 97.7 F (36.5 C) 98.1 F (36.7 C)  TempSrc: Oral Oral Oral Oral  SpO2: 98% 92% 97% 96%  Weight:      Height:         Additional Objective Labs: Basic Metabolic Panel: Recent Labs  Lab 10/27/20 0854 10/28/20 0457 10/29/20 0741  NA 136 135 134*  K 3.6 3.7 3.8  CL 97* 99 99  CO2 29 30 29   GLUCOSE 109* 93 85  BUN 21 16 27*  CREATININE 4.53* 3.36* 4.66*  CALCIUM 8.3* 7.9* 8.1*  PHOS 3.6 3.1 2.4*   CBC: Recent Labs  Lab 10/26/20 2303 10/27/20 0854 10/27/20 1107 10/28/20 0457 10/28/20 1620 10/29/20 0741  WBC 14.6* 11.3* 10.3 9.7  --  11.6*  HGB 8.5* 6.4* 6.3* 6.5* 8.1* 7.8*  HCT 27.5* 19.6* 19.8* 19.9* 23.8* 23.3*  MCV 96.5 92.0 92.5 89.6  --  87.9  PLT 287 190 192 182  --  209   Blood Culture    Component Value Date/Time   SDES URINE, RANDOM 07/17/2020 1500   SPECREQUEST  07/17/2020 1500    NONE Performed at [facility], [place] [address], [place], [place] [address]    CULT MULTIPLE SPECIES PRESENT, SUGGEST RECOLLECTION (A) 07/17/2020 1500   REPTSTATUS 07/18/2020 FINAL 07/17/2020 1500     Physical Exam General: Well appearing, nad  Heart: RRR Lungs: Clear, bilaterally  Abdomen: soft non-tender  Extremities: Trace LE edema  Dialysis Access: R IJ TDC; LUE AVF+bruit,  bandaged   Medications:  . (feeding supplement) PROSource Plus  30 mL Oral BID BM  . amiodarone  200 mg Oral Daily  . ondansetron  4 mg Oral Once  . senna-docusate  2 tablet Oral QHS  . timolol  1 drop Both Eyes Daily    [facility] TTS  4h    400/500    71kg  2K/2Ca   P2  Hep none  RIJ TDC (AVF infiltrated and not using) -Mircera 75 q 2 wks (last 5/5) No VDRA    Assessment/Plan: 1. LUE  hematoma: s/p evacuation 5/10 per Dr. Cain/VVS  2. ESRD -  HD TTS. HD yest. Next HD here tomorrow.  3. Hypertension/volume  - BP controlled. No BP meds on home list.  Up 2kg, stable on exam.  4. Anemia of CKD/ABLA   - Hgb 6.4>6.5 post op. Improved to 7.8 s/p 1 unit prbcs 5/10. Recent ESA dose as outpatient. Follow trends. Transfuse prn.  5. Metabolic bone disease -  Ca/Phos ok. No binders or VDRA   6. Chronic combined CHF -- volume removal with UF on HD  7. PAFib- On Eliquis/amiodarone  8. Nutrition - Renal diet/fluid restriction. Prot supp for low albumin  Kelly Splinter, MD 10/30/2020, 2:34 PM

## 2020-10-30 NOTE — Progress Notes (Signed)
Occupational Therapy Treatment Patient Details Name: [name] MRN: [id number] DOB: [birth year] Today's Date: 10/30/2020    History of present illness Pt is a 75 y.o. female who presented 5/9 with progressively worsening L UE pain with hematoma and numbness. CT left upper extremity without contrast showed findings with concern for compartment syndrome. S/p evacuation of L UE hematoma and primary repair on L brachial artery pseudoaneurysm 5/10. PMH: ESRD HD TTS, aortic valve stenosis paroxysmal A. fib on Eliquis, hx of MI, HTN, glaucoma, GERD, CHF, and malfunctioning left-sided fistula.   OT comments  Pt making steady progress towards OT goals this session. Session focus on LUE AROM therex to improve strength and ROM for higher level BADLS. Pt completed therex as indicated below, pt needed active assist to complete shoulder flexion to ~ 90* and elbow flexion ~ 90*. Additionally issued pt level 1 theraputty to facilitate improved Centre. Pt completed therex as indicated to facilitate improved intrinsic muscle strength and coordination. Pt also able functionally reach trunk forward to grasp ADL items from OTA and open items using L hand as stabilizer, encouraged pt to use LUE when reaching for items on lunch tray. DC plan remains appropriate, will follow acutely per POC.    Follow Up Recommendations  CIR;Supervision/Assistance - 24 hour    Equipment Recommendations  Other (comment) (TBD next venue)    Recommendations for Other Services      Precautions / Restrictions Precautions Precautions: Fall Precaution Comments: LUE has staples on inside of upper arm and skin tear on bottom part of arm Restrictions Weight Bearing Restrictions: No       Mobility Bed Mobility                    Transfers                      Balance                                           ADL either performed or assessed with clinical judgement   ADL Overall ADL's : Needs  assistance/impaired   Eating/Feeding Details (indicate cue type and reason): encouraged pt to use LUE to reach for items on tray and use L hand as stabilize while R hand completes precision task of opening items                                   General ADL Comments: session focused on LUE HEP. pt continues to be mostly limited by increased pain in LUE and decreased activity tolerance     Vision       Perception     Praxis      Cognition Arousal/Alertness: Awake/alert Behavior During Therapy: Flat affect Overall Cognitive Status: Within Functional Limits for tasks assessed                                          Exercises General Exercises - Upper Extremity Shoulder Flexion: Left;10 reps;Supine;Limitations;AAROM Shoulder Flexion Limitations: 90* Elbow Flexion: Left;AAROM;10 reps;Supine Elbow Extension: AAROM;Left;10 reps;Supine Wrist Flexion: AROM;Left;10 reps;Supine Wrist Extension: AROM;Left;10 reps;Supine Digit Composite Flexion: AROM;Left;10 reps;Supine Composite Extension: AROM;Left;10 reps;Supine Hand Exercises Digit Lifts: AROM;Left;Supine;5 reps Thumb  Abduction: AROM;Left;5 reps;Supine Thumb Adduction: AROM;Left;5 reps;Supine Opposition: AROM;Left;10 reps;Supine Other Exercises Other Exercises: issued pt level 1 theraputty with written HEP including flexion/ extension of digits, pincer grasp, scapular protraction<>retraction Other Exercises: encouraged pt to complete therex within pts tolerance, also demo'ed some exercises to be completed with caregiver support and later once pts AROM improves   Shoulder Instructions       General Comments issued pt written HEP for LUE and level 1 theraputty    Pertinent Vitals/ Pain       Pain Assessment: 0-10 Pain Score: 6  (up to a 7 at times) Pain Location: LUE Pain Descriptors / Indicators: Numbness;Tingling Pain Intervention(s): Limited activity within patient's tolerance;Monitored  during session;Repositioned;Other (comment) (elevated LUE  on pillow)  Home Living                                          Prior Functioning/Environment              Frequency  Min 2X/week        Progress Toward Goals  OT Goals(current goals can now be found in the care plan section)  Progress towards OT goals: Progressing toward goals  Acute Rehab OT Goals Patient Stated Goal: none stated OT Goal Formulation: With patient Time For Goal Achievement: 11/12/20 Potential to Achieve Goals: Good  Plan Discharge plan remains appropriate;Frequency remains appropriate    Co-evaluation                 AM-PAC OT "6 Clicks" Daily Activity     Outcome Measure   Help from another person eating meals?: A Little Help from another person taking care of personal grooming?: A Little Help from another person toileting, which includes using toliet, bedpan, or urinal?: A Lot Help from another person bathing (including washing, rinsing, drying)?: A Lot Help from another person to put on and taking off regular upper body clothing?: A Lot Help from another person to put on and taking off regular lower body clothing?: Total 6 Click Score: 13    End of Session Equipment Utilized During Treatment: Other (comment) (level 1 theraputty)  OT Visit Diagnosis: Unsteadiness on feet (R26.81);Other abnormalities of gait and mobility (R26.89);Muscle weakness (generalized) (M62.81);Pain Pain - Right/Left: Left Pain - part of body: Arm   Activity Tolerance Patient tolerated treatment well   Patient Left in bed;with call bell/phone within reach;with bed alarm set   Nurse Communication Mobility status        Time: [id number] OT Time Calculation (min): 35 min  Charges: OT General Charges $OT Visit: 1 Visit OT Treatments $Therapeutic Exercise: 23-37 mins  Harley Alto., COTA/L Acute Rehabilitation Services [place] 10/30/2020, 11:09 AM

## 2020-10-30 NOTE — Progress Notes (Signed)
PROGRESS NOTE    [name]  OXB:[id number] DOB: 1945-11-28 DOA: 10/26/2020 PCP: Lajean Manes, MD    Brief Narrative:  75 year old F with PMH of ESRD on HD TTS, aortic valve stenosis, paroxysmal A. fib on Eliquis, malfunctioning left arm aVF and debility presented to ED with progressive LUE pain and numbness due to bleeding from malfunctioning left arm aVF with hematoma, and admitted for compartment syndrome.    Patient underwent emergent evacuation of LUE hematoma and repair of left brachial artery pseudoaneurysm by Dr. Donzetta Matters with significant improvement in her pain and near resolution of hyperesthesia. Received total 2 units of PRBC.  Hemoglobin 7.9.   Assessment & Plan:   Active Problems:   Compartment syndrome (HCC)  Left upper extremity compartment syndrome due to brachial artery pseudoaneurysm/malfunctioning AV fistula: Status postrepair and evacuation.  Clinically improving.  Continue elevation. sing permacath.  Pain controlled.  Mobility.   ESRD on hemodialysis TTS: Received permacath 5/10.  Dialysis 5/10 and 5/12. Followed by nephrology.  Chronic diastolic congestive heart failure: Fairly euvolemic.  Fluid management with dialysis.  Acute blood loss anemia with anemia of chronic disease of ESRD: Recent on hemoglobin 8.4-6.4-1 unit of PRBC 5/10-6.5-1 unit of PRBC-7.9.  Recheck tomorrow morning. No current evidence of active bleeding.  Paroxysmal A. fib: Currently sinus rhythm.  On amiodarone.  Eliquis on hold.  Will resume once hemoglobin stabilizes.  Debility: Extreme debilitated.  Work with PT OT.  Recommended acute inpatient rehab.  Waiting for bed availability.  Patient is medically stabilizing.  Refer to acute inpatient rehab.   DVT prophylaxis: Place and maintain sequential compression device Start: 10/27/20 2155   Code Status: Full code Family Communication: Patient's daughter at the bedside 5/12. Disposition Plan: Status is: Inpatient  Remains inpatient  appropriate because:Inpatient level of care appropriate due to severity of illness   Dispo:  Patient From: Home  Planned Disposition: Home with home health vs CIR  Medically stable for discharge: yes        Consultants:   Vascular surgery  Nephrology  Procedures:   Permacath 5/10  Clot evacuation left AV fistula 5/10  Antimicrobials:   None   Subjective: Patient seen and examined.  No overnight events.  He still has mild pain and it hurts on moving left arm. She would like to explore going to acute inpatient rehab, if unable she would not go to a skilled nursing facility she would rather go home with home health and therapies.  Objective: Vitals:   10/29/20 1942 10/29/20 2321 10/30/20 0351 10/30/20 0755  BP: (!) 104/46 (!) 115/47 120/63 (!) 148/52  Pulse: 71 69 66 74  Resp: 15 14 12 12   Temp: (!) 97.5 F (36.4 C) 98.4 F (36.9 C) (!) 97.5 F (36.4 C) 97.7 F (36.5 C)  TempSrc: Oral Oral Oral Oral  SpO2: 97% 98% 92% 97%  Weight:      Height:        Intake/Output Summary (Last 24 hours) at 10/30/2020 1000 Last data filed at 10/29/2020 1209 Gross per 24 hour  Intake --  Output 2500 ml  Net -2500 ml   Filed Weights   10/27/20 1608 10/29/20 0754 10/29/20 1218  Weight: 75 kg 75.5 kg 73 kg    Examination:  General exam: Appears calm and comfortable, on room air. Looks comfortable. Respiratory system: Clear to auscultation. Respiratory effort normal.  No added sounds. Permacath right chest wall. Ecchymosis all around the skin. Cardiovascular system: S1 & S2 heard, RRR.  Gastrointestinal  system: Abdomen is nondistended, soft and nontender. No organomegaly or masses felt. Normal bowel sounds heard. Central nervous system: Alert and oriented. No focal neurological deficits. Extremities: Symmetric 5 x 5 power. Left upper extremity with ecchymosis, swelling around the forearm.  Distal neurovascular status intact.    Data Reviewed: I have personally  reviewed following labs and imaging studies  CBC: Recent Labs  Lab 10/26/20 2303 10/27/20 0854 10/27/20 1107 10/28/20 0457 10/28/20 1620 10/29/20 0741  WBC 14.6* 11.3* 10.3 9.7  --  11.6*  HGB 8.5* 6.4* 6.3* 6.5* 8.1* 7.8*  HCT 27.5* 19.6* 19.8* 19.9* 23.8* 23.3*  MCV 96.5 92.0 92.5 89.6  --  87.9  PLT 287 190 192 182  --  151   Basic Metabolic Panel: Recent Labs  Lab 10/26/20 2303 10/27/20 0854 10/28/20 0457 10/29/20 0741  NA 136 136 135 134*  K 3.5 3.6 3.7 3.8  CL 97* 97* 99 99  CO2 28 29 30 29   GLUCOSE 108* 109* 93 85  BUN 19 21 16  27*  CREATININE 4.27* 4.53* 3.36* 4.66*  CALCIUM 8.5* 8.3* 7.9* 8.1*  MG  --  2.0 1.7  --   PHOS  --  3.6 3.1 2.4*   GFR: Estimated Creatinine Clearance: 10 mL/min (A) (by C-G formula based on SCr of 4.66 mg/dL (H)). Liver Function Tests: Recent Labs  Lab 10/26/20 2303 10/27/20 0854 10/28/20 0457 10/29/20 0741  AST 23 22  --   --   ALT 12 12  --   --   ALKPHOS 97 74  --   --   BILITOT 1.5* 1.5*  --   --   PROT 4.5* 4.6*  --   --   ALBUMIN 2.0* 2.6* 1.8* 1.6*   No results for input(s): LIPASE, AMYLASE in the last 168 hours. No results for input(s): AMMONIA in the last 168 hours. Coagulation Profile: Recent Labs  Lab 10/27/20 0854  INR 1.8*   Cardiac Enzymes: No results for input(s): CKTOTAL, CKMB, CKMBINDEX, TROPONINI in the last 168 hours. BNP (last 3 results) No results for input(s): PROBNP in the last 8760 hours. HbA1C: No results for input(s): HGBA1C in the last 72 hours. CBG: No results for input(s): GLUCAP in the last 168 hours. Lipid Profile: No results for input(s): CHOL, HDL, LDLCALC, TRIG, CHOLHDL, LDLDIRECT in the last 72 hours. Thyroid Function Tests: No results for input(s): TSH, T4TOTAL, FREET4, T3FREE, THYROIDAB in the last 72 hours. Anemia Panel: No results for input(s): VITAMINB12, FOLATE, FERRITIN, TIBC, IRON, RETICCTPCT in the last 72 hours. Sepsis Labs: No results for input(s): PROCALCITON,  LATICACIDVEN in the last 168 hours.  Recent Results (from the past 240 hour(s))  Resp Panel by RT-PCR (Flu A&B, Covid) Nasopharyngeal Swab     Status: None   Collection Time: 10/27/20  2:44 AM   Specimen: Nasopharyngeal Swab; Nasopharyngeal(NP) swabs in vial transport medium  Result Value Ref Range Status   SARS Coronavirus 2 by RT PCR NEGATIVE NEGATIVE Final    Comment: (NOTE) SARS-CoV-2 target nucleic acids are NOT DETECTED.  The SARS-CoV-2 RNA is generally detectable in upper respiratory specimens during the acute phase of infection. The lowest concentration of SARS-CoV-2 viral copies this assay can detect is 138 copies/mL. A negative result does not preclude SARS-Cov-2 infection and should not be used as the sole basis for treatment or other patient management decisions. A negative result may occur with  improper specimen collection/handling, submission of specimen other than nasopharyngeal swab, presence of viral mutation(s) within the  areas targeted by this assay, and inadequate number of viral copies(<138 copies/mL). A negative result must be combined with clinical observations, patient history, and epidemiological information. The expected result is Negative.  Fact Sheet for Patients:  EntrepreneurPulse.com.au  Fact Sheet for Healthcare Providers:  IncredibleEmployment.be  This test is no t yet approved or cleared by the [place] FDA and  has been authorized for detection and/or diagnosis of SARS-CoV-2 by FDA under an Emergency Use Authorization (EUA). This EUA will remain  in effect (meaning this test can be used) for the duration of the COVID-19 declaration under Section 564(b)(1) of the Act, 21 U.S.C.section 360bbb-3(b)(1), unless the authorization is terminated  or revoked sooner.       Influenza A by PCR NEGATIVE NEGATIVE Final   Influenza B by PCR NEGATIVE NEGATIVE Final    Comment: (NOTE) The Xpert Xpress  SARS-CoV-2/FLU/RSV plus assay is intended as an aid in the diagnosis of influenza from Nasopharyngeal swab specimens and should not be used as a sole basis for treatment. Nasal washings and aspirates are unacceptable for Xpert Xpress SARS-CoV-2/FLU/RSV testing.  Fact Sheet for Patients: EntrepreneurPulse.com.au  Fact Sheet for Healthcare Providers: IncredibleEmployment.be  This test is not yet approved or cleared by the [place] FDA and has been authorized for detection and/or diagnosis of SARS-CoV-2 by FDA under an Emergency Use Authorization (EUA). This EUA will remain in effect (meaning this test can be used) for the duration of the COVID-19 declaration under Section 564(b)(1) of the Act, 21 U.S.C. section 360bbb-3(b)(1), unless the authorization is terminated or revoked.  Performed at [facility], [place] [address] [place], [place] [address]   MRSA PCR Screening     Status: None   Collection Time: 10/27/20  8:22 AM   Specimen: Nasal Mucosa; Nasopharyngeal  Result Value Ref Range Status   MRSA by PCR NEGATIVE NEGATIVE Final    Comment:        The GeneXpert MRSA Assay (FDA approved for NASAL specimens only), is one component of a comprehensive MRSA colonization surveillance program. It is not intended to diagnose MRSA infection nor to guide or monitor treatment for MRSA infections. Performed at [facility], [place] [address], [place], [place] [address]          Radiology Studies: No results found.      Scheduled Meds: . (feeding supplement) PROSource Plus  30 mL Oral BID BM  . amiodarone  200 mg Oral Daily  . ondansetron  4 mg Oral Once  . senna-docusate  2 tablet Oral QHS  . timolol  1 drop Both Eyes Daily   Continuous Infusions:    LOS: 3 days    Time spent: 30 minutes    Barb Merino, MD [facility] Pager [phone]

## 2020-10-31 LAB — CBC WITH DIFFERENTIAL/PLATELET
Abs Immature Granulocytes: 0.06 10*3/uL (ref 0.00–0.07)
Basophils Absolute: 0.1 10*3/uL (ref 0.0–0.1)
Basophils Relative: 1 %
Eosinophils Absolute: 0.4 10*3/uL (ref 0.0–0.5)
Eosinophils Relative: 4 %
HCT: 26.4 % — ABNORMAL LOW (ref 36.0–46.0)
Hemoglobin: 8.7 g/dL — ABNORMAL LOW (ref 12.0–15.0)
Immature Granulocytes: 1 %
Lymphocytes Relative: 8 %
Lymphs Abs: 0.8 10*3/uL (ref 0.7–4.0)
MCH: 29.7 pg (ref 26.0–34.0)
MCHC: 33 g/dL (ref 30.0–36.0)
MCV: 90.1 fL (ref 80.0–100.0)
Monocytes Absolute: 0.8 10*3/uL (ref 0.1–1.0)
Monocytes Relative: 8 %
Neutro Abs: 8 10*3/uL — ABNORMAL HIGH (ref 1.7–7.7)
Neutrophils Relative %: 78 %
Platelets: 222 10*3/uL (ref 150–400)
RBC: 2.93 MIL/uL — ABNORMAL LOW (ref 3.87–5.11)
RDW: 23.4 % — ABNORMAL HIGH (ref 11.5–15.5)
WBC: 10.1 10*3/uL (ref 4.0–10.5)
nRBC: 0 % (ref 0.0–0.2)

## 2020-10-31 LAB — RENAL FUNCTION PANEL
Albumin: 1.5 g/dL — ABNORMAL LOW (ref 3.5–5.0)
Anion gap: 4 — ABNORMAL LOW (ref 5–15)
BUN: 23 mg/dL (ref 8–23)
CO2: 30 mmol/L (ref 22–32)
Calcium: 8 mg/dL — ABNORMAL LOW (ref 8.9–10.3)
Chloride: 99 mmol/L (ref 98–111)
Creatinine, Ser: 3.91 mg/dL — ABNORMAL HIGH (ref 0.44–1.00)
GFR, Estimated: 11 mL/min — ABNORMAL LOW (ref >60)
Glucose, Bld: 79 mg/dL (ref 70–99)
Phosphorus: 2.2 mg/dL — ABNORMAL LOW (ref 2.5–4.6)
Potassium: 3.6 mmol/L (ref 3.5–5.1)
Sodium: 133 mmol/L — ABNORMAL LOW (ref 135–145)

## 2020-10-31 MED ORDER — ACETAMINOPHEN 325 MG PO TABS
ORAL_TABLET | ORAL | Status: AC
  Filled 2020-10-31: qty 2

## 2020-10-31 MED ORDER — LATANOPROST 0.005 % OP SOLN
1.0000 [drp] | Freq: Every day | OPHTHALMIC | Status: DC
  Administered 2020-10-31 – 2020-11-02 (×3): 1 [drp] via OPHTHALMIC
  Filled 2020-10-31: qty 2.5

## 2020-10-31 MED ORDER — HEPARIN SODIUM (PORCINE) 1000 UNIT/ML IJ SOLN
INTRAMUSCULAR | Status: AC
  Administered 2020-10-31: 3200 [IU]
  Filled 2020-10-31: qty 4

## 2020-10-31 NOTE — Progress Notes (Signed)
   [facility] Progress Note   Subjective:  Seen on HD, no c/o  Objective Vitals:   10/31/20 1259 10/31/20 1305 10/31/20 1330 10/31/20 1400  BP:  (!) 133/46 (!) 89/47 (!) 109/44  Pulse:  67 66 65  Resp:    13  Temp: 97.8 F (36.6 C)     TempSrc: Oral     SpO2:      Weight:      Height:         Additional Objective Labs: Basic Metabolic Panel: Recent Labs  Lab 10/28/20 0457 10/29/20 0741 10/31/20 1250  NA 135 134* 133*  K 3.7 3.8 3.6  CL 99 99 99  CO2 30 29 30   GLUCOSE 93 85 79  BUN 16 27* 23  CREATININE 3.36* 4.66* 3.91*  CALCIUM 7.9* 8.1* 8.0*  PHOS 3.1 2.4* 2.2*   CBC: Recent Labs  Lab 10/27/20 0854 10/27/20 1107 10/28/20 0457 10/28/20 1620 10/29/20 0741 10/31/20 0402  WBC 11.3* 10.3 9.7  --  11.6* 10.1  NEUTROABS  --   --   --   --   --  8.0*  HGB 6.4* 6.3* 6.5* 8.1* 7.8* 8.7*  HCT 19.6* 19.8* 19.9* 23.8* 23.3* 26.4*  MCV 92.0 92.5 89.6  --  87.9 90.1  PLT 190 192 182  --  209 222   Blood Culture    Component Value Date/Time   SDES URINE, RANDOM 07/17/2020 1500   SPECREQUEST  07/17/2020 1500    NONE Performed at [facility] [address], [place], [place] [address]    CULT MULTIPLE SPECIES PRESENT, SUGGEST RECOLLECTION (A) 07/17/2020 1500   REPTSTATUS 07/18/2020 FINAL 07/17/2020 1500     Physical Exam General: Well appearing, nad  Heart: RRR Lungs: Clear, bilaterally  Abdomen: soft non-tender  Extremities: Trace LE edema  Dialysis Access: R IJ TDC; LUE AVF+bruit,  bandaged   Medications:  . (feeding supplement) PROSource Plus  30 mL Oral BID BM  . amiodarone  200 mg Oral Daily  . cephALEXin  250 mg Oral Daily  . ondansetron  4 mg Oral Once  . senna-docusate  2 tablet Oral QHS  . timolol  1 drop Both Eyes Daily    [facility] TTS  4h    400/500    71kg  2K/2Ca   P2  Hep none  RIJ TDC (AVF infiltrated and not using) -Mircera 75 q 2 wks (last 5/5) No VDRA    Assessment/Plan: 1. LUE evacuation of hematoma/  repair of bleeding brachial artery pseudoaneurysm: on 5/10 per Dr. Cain/VVS  2. ESRD -  HD TTS. HD today on schedule.  3. Hypertension/volume  - BP controlled. No BP meds on home list.  Up 2kg, stable on exam.  4. Anemia of CKD/ABLA   - Hgb 6.5 at lowest here post-op. SP 2 unit prbcs 5/11. Recent ESA dose as outpatient. Hb up to 8.7 today.  5. Metabolic bone disease -  Ca/Phos ok. No binders or VDRA   6. Chronic combined CHF -- volume removal with UF on HD  7. PAFib- On Eliquis/amiodarone  8. Nutrition - Renal diet/fluid restriction. Prot supp for low albumin  Kelly Splinter, MD 10/31/2020, 2:26 PM

## 2020-10-31 NOTE — Progress Notes (Signed)
PROGRESS NOTE    ANNAH Reilly  ZOX:[id number] DOB: May 16, 1946 DOA: 10/26/2020 PCP: Lajean Manes, MD    Brief Narrative:  75 year old F with PMH of ESRD on HD TTS, aortic valve stenosis, paroxysmal A. fib on Eliquis, malfunctioning left arm aVF and debility presented to ED with progressive LUE pain and numbness due to bleeding from malfunctioning left arm aVF with hematoma, and admitted for compartment syndrome.    Patient underwent emergent evacuation of LUE hematoma and repair of left brachial artery pseudoaneurysm by Dr. Donzetta Matters with significant improvement in her pain and near resolution of hyperesthesia. Received total 2 units of PRBC.  Hemoglobin 7.9.   Assessment & Plan:   Active Problems:   Compartment syndrome (HCC)  Left upper extremity compartment syndrome due to brachial artery pseudoaneurysm/malfunctioning AV fistula: Status postrepair and evacuation.  Clinically improving.  Continue elevation. Using permacath.  Pain controlled.  Mobility.   ESRD on hemodialysis TTS: Received permacath 5/10.  Receiving dialysis TTS schedule.  Chronic diastolic congestive heart failure: Fairly euvolemic.  Fluid management with dialysis.  Acute blood loss anemia with anemia of chronic disease of ESRD: Recent on hemoglobin 8.4-6.4-1 unit of PRBC 5/10-6.5-1 unit of PRBC-7.9-8.7. No current evidence of active bleeding.  Paroxysmal A. fib: Currently sinus rhythm.  On amiodarone.  Eliquis on hold.  Will resume once hemoglobin stabilizes and fistula stops bleeding.  Debility: Extreme debilitated.  Work with PT OT.  Recommended acute inpatient rehab.  Waiting for bed availability.  Patient is medically stabilizing.  Refer to acute inpatient rehab.   DVT prophylaxis: Place and maintain sequential compression device Start: 10/27/20 2155   Code Status: Full code Family Communication: Patient's daughter at the bedside 5/12. Disposition Plan: Status is: Inpatient  Remains inpatient  appropriate because:Inpatient level of care appropriate due to severity of illness   Dispo:  Patient From: Home  Planned Disposition: Inpatient Rehab with home health vs CIR  Medically stable for discharge: yes        Consultants:   Vascular surgery  Nephrology  Procedures:   Permacath 5/10  Clot evacuation left AV fistula 5/10  Antimicrobials:   None   Subjective: Patient seen and examined.  Pain and tingling is better.  She had some superficial bleeding from the fistula site yesterday stopped with pressure.  No other overnight events.  Going for dialysis today.  Objective: Vitals:   10/31/20 0408 10/31/20 0500 10/31/20 0809 10/31/20 1058  BP: (!) 145/60  (!) 143/53 125/60  Pulse: 70 67 75 75  Resp: 17 13 15 12   Temp: 97.6 F (36.4 C)  (!) 97.5 F (36.4 C) 98.2 F (36.8 C)  TempSrc: Oral  Oral Oral  SpO2: 97% 95% 98% 95%  Weight:      Height:        Intake/Output Summary (Last 24 hours) at 10/31/2020 1105 Last data filed at 10/30/2020 1245 Gross per 24 hour  Intake 240 ml  Output --  Net 240 ml   Filed Weights   10/27/20 1608 10/29/20 0754 10/29/20 1218  Weight: 75 kg 75.5 kg 73 kg    Examination:  General exam: Appears calm and comfortable, on room air. Looks comfortable. Respiratory system: Clear to auscultation. Respiratory effort normal.  No added sounds. Permacath right chest wall. Ecchymosis all around the skin and under the dressing. Cardiovascular system: S1 & S2 heard, RRR.  Gastrointestinal system: Abdomen is nondistended, soft and nontender. No organomegaly or masses felt. Normal bowel sounds heard. Central nervous system: Alert and  oriented. No focal neurological deficits. Extremities: Symmetric 5 x 5 power. Left upper extremity with ecchymosis and some swelling.  Distal neurovascular status intact.    Data Reviewed: I have personally reviewed following labs and imaging studies  CBC: Recent Labs  Lab 10/27/20 0854  10/27/20 1107 10/28/20 0457 10/28/20 1620 10/29/20 0741 10/31/20 0402  WBC 11.3* 10.3 9.7  --  11.6* 10.1  NEUTROABS  --   --   --   --   --  8.0*  HGB 6.4* 6.3* 6.5* 8.1* 7.8* 8.7*  HCT 19.6* 19.8* 19.9* 23.8* 23.3* 26.4*  MCV 92.0 92.5 89.6  --  87.9 90.1  PLT 190 192 182  --  209 254   Basic Metabolic Panel: Recent Labs  Lab 10/26/20 2303 10/27/20 0854 10/28/20 0457 10/29/20 0741  NA 136 136 135 134*  K 3.5 3.6 3.7 3.8  CL 97* 97* 99 99  CO2 28 29 30 29   GLUCOSE 108* 109* 93 85  BUN 19 21 16  27*  CREATININE 4.27* 4.53* 3.36* 4.66*  CALCIUM 8.5* 8.3* 7.9* 8.1*  MG  --  2.0 1.7  --   PHOS  --  3.6 3.1 2.4*   GFR: Estimated Creatinine Clearance: 10 mL/min (A) (by C-G formula based on SCr of 4.66 mg/dL (H)). Liver Function Tests: Recent Labs  Lab 10/26/20 2303 10/27/20 0854 10/28/20 0457 10/29/20 0741  AST 23 22  --   --   ALT 12 12  --   --   ALKPHOS 97 74  --   --   BILITOT 1.5* 1.5*  --   --   PROT 4.5* 4.6*  --   --   ALBUMIN 2.0* 2.6* 1.8* 1.6*   No results for input(s): LIPASE, AMYLASE in the last 168 hours. No results for input(s): AMMONIA in the last 168 hours. Coagulation Profile: Recent Labs  Lab 10/27/20 0854  INR 1.8*   Cardiac Enzymes: No results for input(s): CKTOTAL, CKMB, CKMBINDEX, TROPONINI in the last 168 hours. BNP (last 3 results) No results for input(s): PROBNP in the last 8760 hours. HbA1C: No results for input(s): HGBA1C in the last 72 hours. CBG: No results for input(s): GLUCAP in the last 168 hours. Lipid Profile: No results for input(s): CHOL, HDL, LDLCALC, TRIG, CHOLHDL, LDLDIRECT in the last 72 hours. Thyroid Function Tests: No results for input(s): TSH, T4TOTAL, FREET4, T3FREE, THYROIDAB in the last 72 hours. Anemia Panel: No results for input(s): VITAMINB12, FOLATE, FERRITIN, TIBC, IRON, RETICCTPCT in the last 72 hours. Sepsis Labs: No results for input(s): PROCALCITON, LATICACIDVEN in the last 168 hours.  Recent  Results (from the past 240 hour(s))  Resp Panel by RT-PCR (Flu A&B, Covid) Nasopharyngeal Swab     Status: None   Collection Time: 10/27/20  2:44 AM   Specimen: Nasopharyngeal Swab; Nasopharyngeal(NP) swabs in vial transport medium  Result Value Ref Range Status   SARS Coronavirus 2 by RT PCR NEGATIVE NEGATIVE Final    Comment: (NOTE) SARS-CoV-2 target nucleic acids are NOT DETECTED.  The SARS-CoV-2 RNA is generally detectable in upper respiratory specimens during the acute phase of infection. The lowest concentration of SARS-CoV-2 viral copies this assay can detect is 138 copies/mL. A negative result does not preclude SARS-Cov-2 infection and should not be used as the sole basis for treatment or other patient management decisions. A negative result may occur with  improper specimen collection/handling, submission of specimen other than nasopharyngeal swab, presence of viral mutation(s) within the areas targeted by this  assay, and inadequate number of viral copies(<138 copies/mL). A negative result must be combined with clinical observations, patient history, and epidemiological information. The expected result is Negative.  Fact Sheet for Patients:  EntrepreneurPulse.com.au  Fact Sheet for Healthcare Providers:  IncredibleEmployment.be  This test is no t yet approved or cleared by the [place] FDA and  has been authorized for detection and/or diagnosis of SARS-CoV-2 by FDA under an Emergency Use Authorization (EUA). This EUA will remain  in effect (meaning this test can be used) for the duration of the COVID-19 declaration under Section 564(b)(1) of the Act, 21 U.S.C.section 360bbb-3(b)(1), unless the authorization is terminated  or revoked sooner.       Influenza A by PCR NEGATIVE NEGATIVE Final   Influenza B by PCR NEGATIVE NEGATIVE Final    Comment: (NOTE) The Xpert Xpress SARS-CoV-2/FLU/RSV plus assay is intended as an aid in the  diagnosis of influenza from Nasopharyngeal swab specimens and should not be used as a sole basis for treatment. Nasal washings and aspirates are unacceptable for Xpert Xpress SARS-CoV-2/FLU/RSV testing.  Fact Sheet for Patients: EntrepreneurPulse.com.au  Fact Sheet for Healthcare Providers: IncredibleEmployment.be  This test is not yet approved or cleared by the [place] FDA and has been authorized for detection and/or diagnosis of SARS-CoV-2 by FDA under an Emergency Use Authorization (EUA). This EUA will remain in effect (meaning this test can be used) for the duration of the COVID-19 declaration under Section 564(b)(1) of the Act, 21 U.S.C. section 360bbb-3(b)(1), unless the authorization is terminated or revoked.  Performed at [facility], [place] [address], [place], [place] [address]   MRSA PCR Screening     Status: None   Collection Time: 10/27/20  8:22 AM   Specimen: Nasal Mucosa; Nasopharyngeal  Result Value Ref Range Status   MRSA by PCR NEGATIVE NEGATIVE Final    Comment:        The GeneXpert MRSA Assay (FDA approved for NASAL specimens only), is one component of a comprehensive MRSA colonization surveillance program. It is not intended to diagnose MRSA infection nor to guide or monitor treatment for MRSA infections. Performed at [facility], [place] [address] [place], [place] [address]          Radiology Studies: No results found.      Scheduled Meds: . (feeding supplement) PROSource Plus  30 mL Oral BID BM  . amiodarone  200 mg Oral Daily  . cephALEXin  250 mg Oral Daily  . ondansetron  4 mg Oral Once  . senna-docusate  2 tablet Oral QHS  . timolol  1 drop Both Eyes Daily   Continuous Infusions:    LOS: 4 days    Time spent: 30 minutes    Barb Merino, MD [facility] Pager [phone]

## 2020-10-31 NOTE — Procedures (Signed)
   I was present at this dialysis session, have reviewed the session itself and made  appropriate changes Kelly Splinter MD [place] pager [phone]   10/31/2020, 2:36 PM

## 2020-10-31 NOTE — Plan of Care (Signed)
  Problem: Education: Goal: Knowledge of General Education information will improve Description: Including pain rating scale, medication(s)/side effects and non-pharmacologic comfort measures Outcome: Progressing   Problem: Health Behavior/Discharge Planning: Goal: Ability to manage health-related needs will improve Outcome: Progressing   Problem: Clinical Measurements: Goal: Ability to maintain clinical measurements within normal limits will improve Outcome: Progressing   Problem: Clinical Measurements: Goal: Will remain free from infection Outcome: Progressing   Problem: Clinical Measurements: Goal: Diagnostic test results will improve Outcome: Progressing   Problem: Clinical Measurements: Goal: Respiratory complications will improve Outcome: Progressing   Problem: Clinical Measurements: Goal: Cardiovascular complication will be avoided Outcome: Progressing   Problem: Safety: Goal: Ability to remain free from injury will improve Outcome: Progressing   Problem: Skin Integrity: Goal: Risk for impaired skin integrity will decrease Outcome: Progressing

## 2020-11-01 LAB — CBC WITH DIFFERENTIAL/PLATELET
Abs Immature Granulocytes: 0.03 10*3/uL (ref 0.00–0.07)
Basophils Absolute: 0 10*3/uL (ref 0.0–0.1)
Basophils Relative: 0 %
Eosinophils Absolute: 0.3 10*3/uL (ref 0.0–0.5)
Eosinophils Relative: 3 %
HCT: 25.9 % — ABNORMAL LOW (ref 36.0–46.0)
Hemoglobin: 8.5 g/dL — ABNORMAL LOW (ref 12.0–15.0)
Immature Granulocytes: 0 %
Lymphocytes Relative: 6 %
Lymphs Abs: 0.6 10*3/uL — ABNORMAL LOW (ref 0.7–4.0)
MCH: 29.8 pg (ref 26.0–34.0)
MCHC: 32.8 g/dL (ref 30.0–36.0)
MCV: 90.9 fL (ref 80.0–100.0)
Monocytes Absolute: 0.6 10*3/uL (ref 0.1–1.0)
Monocytes Relative: 7 %
Neutro Abs: 7.6 10*3/uL (ref 1.7–7.7)
Neutrophils Relative %: 84 %
Platelets: 197 10*3/uL (ref 150–400)
RBC: 2.85 MIL/uL — ABNORMAL LOW (ref 3.87–5.11)
RDW: 23.2 % — ABNORMAL HIGH (ref 11.5–15.5)
WBC: 9.4 10*3/uL (ref 4.0–10.5)
nRBC: 0 % (ref 0.0–0.2)

## 2020-11-01 NOTE — Progress Notes (Signed)
PROGRESS NOTE    NATAJAH DERDERIAN  YKZ:[id number] DOB: 04/09/1946 DOA: 10/26/2020 PCP: Lajean Manes, MD    Brief Narrative:  75 year old F with PMH of ESRD on HD TTS, aortic valve stenosis, paroxysmal A. fib on Eliquis, malfunctioning left arm aVF and debility presented to ED with progressive LUE pain and numbness due to bleeding from malfunctioning left arm aVF with hematoma, and admitted for compartment syndrome.    Patient underwent emergent evacuation of LUE hematoma and repair of left brachial artery pseudoaneurysm by Dr. Donzetta Matters with significant improvement in her pain and near resolution of hyperesthesia. Received total 2 units of PRBC.  Hemoglobin 7.9.   Assessment & Plan:   Active Problems:   Compartment syndrome (HCC)  Left upper extremity compartment syndrome due to brachial artery pseudoaneurysm/malfunctioning AV fistula: Status postrepair and evacuation.  Clinically improving.  Continue elevation. Using permacath.  Pain controlled.  Mobility.   ESRD on hemodialysis TTS: Received permacath 5/10.  Receiving dialysis TTS schedule.  Chronic diastolic congestive heart failure: Fairly euvolemic.  Fluid management with dialysis.  Acute blood loss anemia with anemia of chronic disease of ESRD: Recent on hemoglobin 8.4-6.4-1 unit of PRBC 5/10-6.5-1 unit of PRBC-7.9-8.7. No current evidence of active bleeding.  Paroxysmal A. fib: Currently sinus rhythm.  On amiodarone.  Eliquis on hold.  Will resume once hemoglobin stabilizes and fistula stops bleeding.  Debility: Extreme debilitated.  Work with PT OT.  Recommended acute inpatient rehab.  Waiting for bed availability.  Patient is medically stabilizing.  Refer to acute inpatient rehab.   DVT prophylaxis: Place and maintain sequential compression device Start: 10/27/20 2155   Code Status: Full code Family Communication: None today. Disposition Plan: Status is: Inpatient  Remains inpatient appropriate because:Inpatient level of  care appropriate due to severity of illness   Dispo:  Patient From: Home  Planned Disposition: Inpatient Rehab   Medically stable for discharge: yes        Consultants:   Vascular surgery  Nephrology  Procedures:   Permacath 5/10  Clot evacuation left AV fistula 5/10  Antimicrobials:   None   Subjective: Seen and examined.  Feels extremely weak otherwise no other complaints.  Looking forward to work with therapies today.  No family at bedside.  Mild pain in the left hand otherwise mostly improved.  Objective: Vitals:   11/01/20 0100 11/01/20 0525 11/01/20 0600 11/01/20 0722  BP:    (!) 124/54  Pulse:  72 69 76  Resp: 13 10 19 13   Temp:    97.9 F (36.6 C)  TempSrc:    Oral  SpO2:  96% 95% 96%  Weight:      Height:        Intake/Output Summary (Last 24 hours) at 11/01/2020 1103 Last data filed at 10/31/2020 1636 Gross per 24 hour  Intake --  Output 2197 ml  Net -2197 ml   Filed Weights   10/29/20 0754 10/29/20 1218 10/31/20 1636  Weight: 75.5 kg 73 kg 74 kg    Examination:  General exam: Appears calm and comfortable, on room air. Looks comfortable. Respiratory system: Clear to auscultation. Respiratory effort normal.  No added sounds. Permacath right chest wall. Ecchymosis all around the skin and under the dressing. Cardiovascular system: S1 & S2 heard, RRR.  Gastrointestinal system: Abdomen is nondistended, soft and nontender. No organomegaly or masses felt. Normal bowel sounds heard. Central nervous system: Alert and oriented. No focal neurological deficits. Extremities: Symmetric 5 x 5 power. Left upper extremity with ecchymosis  and some swelling.  Distal neurovascular status intact.    Data Reviewed: I have personally reviewed following labs and imaging studies  CBC: Recent Labs  Lab 10/27/20 1107 10/28/20 0457 10/28/20 1620 10/29/20 0741 10/31/20 0402 11/01/20 0132  WBC 10.3 9.7  --  11.6* 10.1 9.4  NEUTROABS  --   --   --   --   8.0* 7.6  HGB 6.3* 6.5* 8.1* 7.8* 8.7* 8.5*  HCT 19.8* 19.9* 23.8* 23.3* 26.4* 25.9*  MCV 92.5 89.6  --  87.9 90.1 90.9  PLT 192 182  --  209 222 161   Basic Metabolic Panel: Recent Labs  Lab 10/26/20 2303 10/27/20 0854 10/28/20 0457 10/29/20 0741 10/31/20 1250  NA 136 136 135 134* 133*  K 3.5 3.6 3.7 3.8 3.6  CL 97* 97* 99 99 99  CO2 28 29 30 29 30   GLUCOSE 108* 109* 93 85 79  BUN 19 21 16  27* 23  CREATININE 4.27* 4.53* 3.36* 4.66* 3.91*  CALCIUM 8.5* 8.3* 7.9* 8.1* 8.0*  MG  --  2.0 1.7  --   --   PHOS  --  3.6 3.1 2.4* 2.2*   GFR: Estimated Creatinine Clearance: 12 mL/min (A) (by C-G formula based on SCr of 3.91 mg/dL (H)). Liver Function Tests: Recent Labs  Lab 10/26/20 2303 10/27/20 0854 10/28/20 0457 10/29/20 0741 10/31/20 1250  AST 23 22  --   --   --   ALT 12 12  --   --   --   ALKPHOS 97 74  --   --   --   BILITOT 1.5* 1.5*  --   --   --   PROT 4.5* 4.6*  --   --   --   ALBUMIN 2.0* 2.6* 1.8* 1.6* 1.5*   No results for input(s): LIPASE, AMYLASE in the last 168 hours. No results for input(s): AMMONIA in the last 168 hours. Coagulation Profile: Recent Labs  Lab 10/27/20 0854  INR 1.8*   Cardiac Enzymes: No results for input(s): CKTOTAL, CKMB, CKMBINDEX, TROPONINI in the last 168 hours. BNP (last 3 results) No results for input(s): PROBNP in the last 8760 hours. HbA1C: No results for input(s): HGBA1C in the last 72 hours. CBG: No results for input(s): GLUCAP in the last 168 hours. Lipid Profile: No results for input(s): CHOL, HDL, LDLCALC, TRIG, CHOLHDL, LDLDIRECT in the last 72 hours. Thyroid Function Tests: No results for input(s): TSH, T4TOTAL, FREET4, T3FREE, THYROIDAB in the last 72 hours. Anemia Panel: No results for input(s): VITAMINB12, FOLATE, FERRITIN, TIBC, IRON, RETICCTPCT in the last 72 hours. Sepsis Labs: No results for input(s): PROCALCITON, LATICACIDVEN in the last 168 hours.  Recent Results (from the past 240 hour(s))  Resp  Panel by RT-PCR (Flu A&B, Covid) Nasopharyngeal Swab     Status: None   Collection Time: 10/27/20  2:44 AM   Specimen: Nasopharyngeal Swab; Nasopharyngeal(NP) swabs in vial transport medium  Result Value Ref Range Status   SARS Coronavirus 2 by RT PCR NEGATIVE NEGATIVE Final    Comment: (NOTE) SARS-CoV-2 target nucleic acids are NOT DETECTED.  The SARS-CoV-2 RNA is generally detectable in upper respiratory specimens during the acute phase of infection. The lowest concentration of SARS-CoV-2 viral copies this assay can detect is 138 copies/mL. A negative result does not preclude SARS-Cov-2 infection and should not be used as the sole basis for treatment or other patient management decisions. A negative result may occur with  improper specimen collection/handling, submission of specimen  other than nasopharyngeal swab, presence of viral mutation(s) within the areas targeted by this assay, and inadequate number of viral copies(<138 copies/mL). A negative result must be combined with clinical observations, patient history, and epidemiological information. The expected result is Negative.  Fact Sheet for Patients:  EntrepreneurPulse.com.au  Fact Sheet for Healthcare Providers:  IncredibleEmployment.be  This test is no t yet approved or cleared by the [place] FDA and  has been authorized for detection and/or diagnosis of SARS-CoV-2 by FDA under an Emergency Use Authorization (EUA). This EUA will remain  in effect (meaning this test can be used) for the duration of the COVID-19 declaration under Section 564(b)(1) of the Act, 21 U.S.C.section 360bbb-3(b)(1), unless the authorization is terminated  or revoked sooner.       Influenza A by PCR NEGATIVE NEGATIVE Final   Influenza B by PCR NEGATIVE NEGATIVE Final    Comment: (NOTE) The Xpert Xpress SARS-CoV-2/FLU/RSV plus assay is intended as an aid in the diagnosis of influenza from Nasopharyngeal  swab specimens and should not be used as a sole basis for treatment. Nasal washings and aspirates are unacceptable for Xpert Xpress SARS-CoV-2/FLU/RSV testing.  Fact Sheet for Patients: EntrepreneurPulse.com.au  Fact Sheet for Healthcare Providers: IncredibleEmployment.be  This test is not yet approved or cleared by the [place] FDA and has been authorized for detection and/or diagnosis of SARS-CoV-2 by FDA under an Emergency Use Authorization (EUA). This EUA will remain in effect (meaning this test can be used) for the duration of the COVID-19 declaration under Section 564(b)(1) of the Act, 21 U.S.C. section 360bbb-3(b)(1), unless the authorization is terminated or revoked.  Performed at [facility], [place] [address] [place], [place] [address]   MRSA PCR Screening     Status: None   Collection Time: 10/27/20  8:22 AM   Specimen: Nasal Mucosa; Nasopharyngeal  Result Value Ref Range Status   MRSA by PCR NEGATIVE NEGATIVE Final    Comment:        The GeneXpert MRSA Assay (FDA approved for NASAL specimens only), is one component of a comprehensive MRSA colonization surveillance program. It is not intended to diagnose MRSA infection nor to guide or monitor treatment for MRSA infections. Performed at [facility], [place] [address], [place], [place] [address]          Radiology Studies: No results found.      Scheduled Meds: . (feeding supplement) PROSource Plus  30 mL Oral BID BM  . amiodarone  200 mg Oral Daily  . cephALEXin  250 mg Oral Daily  . latanoprost  1 drop Both Eyes QHS  . ondansetron  4 mg Oral Once  . senna-docusate  2 tablet Oral QHS  . timolol  1 drop Both Eyes Daily   Continuous Infusions:    LOS: 5 days    Time spent: 30 minutes    Barb Merino, MD [facility] Pager [phone]

## 2020-11-01 NOTE — Progress Notes (Signed)
  [facility] Progress Note   Subjective:  Seen in room, no new c/o. L arm still hurting.   Objective Vitals:   11/01/20 1249 11/01/20 1433 11/01/20 1942 11/01/20 2033  BP: 121/72 (!) 151/51 (!) 138/45   Pulse: 75 70    Resp: 14 14 (!) 8 18  Temp: 98.5 F (36.9 C) 98.7 F (37.1 C) 97.6 F (36.4 C)   TempSrc: Oral Oral Oral   SpO2: 94% 97%    Weight:      Height:         Physical Exam General: Well appearing, nad  Heart: RRR Lungs: Clear, bilaterally  Abdomen: soft non-tender  Extremities: Trace LE edema  Dialysis Access: R IJ TDC; LUE AVF+bruit,  bandaged    OP HD: [facility] TTS  4h   400/500  71kg  2/2 bath   P2  Hep none  RIJ TDC (AVF works but infiltrated and not using) -Mircera 75 q 2 wks (last 5/5) No VDRA    Assessment/Plan: 1. LUE evacuation of hematoma/ repair of bleeding brachial artery pseudoaneurysm: on 5/10 per Dr. Cain/VVS  2. ESRD -  HD TTS. Next HD 5/17.  3. Hypertension/volume  - BP controlled. No BP meds on home list. UP 2-3kg 4. Anemia of CKD/ABLA   - Hgb 6.5 at lowest here post-op. SP 2 unit prbcs 5/11. Recent ESA dose as outpatient. Hb up to 8.7.  5. Metabolic bone disease -  Ca/Phos ok. No binders or VDRA   6. Chronic combined CHF -- volume removal with UF on HD  7. PAFib- On Eliquis/amiodarone  8. Nutrition - Renal diet/fluid restriction. Prot supp for low albumin 9. Dispo - CIR vs home per the patient  Kelly Splinter, MD 11/01/2020, 10:20 PM

## 2020-11-02 LAB — CBC WITH DIFFERENTIAL/PLATELET
Abs Immature Granulocytes: 0.05 10*3/uL (ref 0.00–0.07)
Basophils Absolute: 0.1 10*3/uL (ref 0.0–0.1)
Basophils Relative: 0 %
Eosinophils Absolute: 0.3 10*3/uL (ref 0.0–0.5)
Eosinophils Relative: 3 %
HCT: 26.8 % — ABNORMAL LOW (ref 36.0–46.0)
Hemoglobin: 8.7 g/dL — ABNORMAL LOW (ref 12.0–15.0)
Immature Granulocytes: 0 %
Lymphocytes Relative: 7 %
Lymphs Abs: 0.8 10*3/uL (ref 0.7–4.0)
MCH: 30.1 pg (ref 26.0–34.0)
MCHC: 32.5 g/dL (ref 30.0–36.0)
MCV: 92.7 fL (ref 80.0–100.0)
Monocytes Absolute: 0.9 10*3/uL (ref 0.1–1.0)
Monocytes Relative: 8 %
Neutro Abs: 9.3 10*3/uL — ABNORMAL HIGH (ref 1.7–7.7)
Neutrophils Relative %: 82 %
Platelets: 203 10*3/uL (ref 150–400)
RBC: 2.89 MIL/uL — ABNORMAL LOW (ref 3.87–5.11)
RDW: 23.5 % — ABNORMAL HIGH (ref 11.5–15.5)
WBC: 11.3 10*3/uL — ABNORMAL HIGH (ref 4.0–10.5)
nRBC: 0 % (ref 0.0–0.2)

## 2020-11-02 MED ORDER — DARBEPOETIN ALFA 100 MCG/0.5ML IJ SOSY: 100.0000 ug | PREFILLED_SYRINGE | INTRAMUSCULAR | Status: DC

## 2020-11-02 MED ORDER — FAMOTIDINE 20 MG PO TABS
20.0000 mg | ORAL_TABLET | Freq: Two times a day (BID) | ORAL | Status: DC
  Administered 2020-11-02: 20 mg via ORAL
  Filled 2020-11-02: qty 1

## 2020-11-02 MED ORDER — APIXABAN 2.5 MG PO TABS
2.5000 mg | ORAL_TABLET | Freq: Two times a day (BID) | ORAL | Status: DC
  Administered 2020-11-02 (×2): 2.5 mg via ORAL
  Filled 2020-11-02 (×2): qty 1

## 2020-11-02 MED ORDER — LOPERAMIDE HCL 2 MG PO CAPS
2.0000 mg | ORAL_CAPSULE | ORAL | Status: DC | PRN
  Administered 2020-11-02 – 2020-11-03 (×4): 2 mg via ORAL
  Filled 2020-11-02 (×4): qty 1

## 2020-11-02 NOTE — Progress Notes (Signed)
OT Cancellation Note  Patient Details Name: [name] MRN: [id number] DOB: [birth year]   Cancelled Treatment:    Reason Eval/Treat Not Completed: Fatigue/lethargy limiting ability to participate (Pt experiencing nausea and diarrhea. PT working pt's LUE HEP so OT deferred today in hopes to see pt tomorrow. OT following acutely for OT intervention.)    Jefferey Pica, OTR/L Acute Rehabilitation Services Pager: [phone] Office: [phone]   Jude Naclerio C 11/02/2020, 4:52 PM

## 2020-11-02 NOTE — Progress Notes (Signed)
Inpatient Rehabilitation Admissions Coordinator  I have insurance approval for CIR admit, but no bed available today. I met with patient at bedside and she is aware. I have left a message for her daughter, [name], to call me. I am hopeful for a bed in the next 1 to 2 days.  Danne Baxter, RN, MSN Rehab Admissions Coordinator [phone] 11/02/2020 1:52 PM

## 2020-11-02 NOTE — Progress Notes (Signed)
[facility] Progress Note   Subjective:   Patient seen and examined at bedside.  Reports pain 8/10 this AM, waiting for nurse to bring pain meds.  Denies CP, SOB, n/v, and abdominal pain. Continues to have diarrhea at night.  Waiting to see if she is approved for CIR.   Objective Vitals:   11/02/20 0300 11/02/20 0353 11/02/20 0400 11/02/20 0839  BP:  (!) 140/56  (!) 142/55  Pulse:      Resp: 18 16 15    Temp:  98.5 F (36.9 C)  97.9 F (36.6 C)  TempSrc:  Oral  Oral  SpO2:      Weight:      Height:       Physical Exam General:chronically ill appearing female in NAD Heart:RRR, +3/3 systolic murmur Lungs:CTAB Abdomen:soft, NTND Extremities:1-2+ LE edema from hips to feet, 1+LUE edema with bruising, dressing in place Dialysis Access: R IJ TDC, LU AVF +b   Filed Weights   10/29/20 0754 10/29/20 1218 10/31/20 1636  Weight: 75.5 kg 73 kg 74 kg    Intake/Output Summary (Last 24 hours) at 11/02/2020 1124 Last data filed at 11/02/2020 0700 Gross per 24 hour  Intake 480 ml  Output 1 ml  Net 479 ml    Additional Objective Labs: Basic Metabolic Panel: Recent Labs  Lab 10/28/20 0457 10/29/20 0741 10/31/20 1250  NA 135 134* 133*  K 3.7 3.8 3.6  CL 99 99 99  CO2 30 29 30   GLUCOSE 93 85 79  BUN 16 27* 23  CREATININE 3.36* 4.66* 3.91*  CALCIUM 7.9* 8.1* 8.0*  PHOS 3.1 2.4* 2.2*   Liver Function Tests: Recent Labs  Lab 10/26/20 2303 10/27/20 0854 10/28/20 0457 10/29/20 0741 10/31/20 1250  AST 23 22  --   --   --   ALT 12 12  --   --   --   ALKPHOS 97 74  --   --   --   BILITOT 1.5* 1.5*  --   --   --   PROT 4.5* 4.6*  --   --   --   ALBUMIN 2.0* 2.6* 1.8* 1.6* 1.5*   CBC: Recent Labs  Lab 10/28/20 0457 10/28/20 1620 10/29/20 0741 10/31/20 0402 11/01/20 0132 11/02/20 0200  WBC 9.7  --  11.6* 10.1 9.4 11.3*  NEUTROABS  --   --   --  8.0* 7.6 9.3*  HGB 6.5*   < > 7.8* 8.7* 8.5* 8.7*  HCT 19.9*   < > 23.3* 26.4* 25.9* 26.8*  MCV 89.6  --   87.9 90.1 90.9 92.7  PLT 182  --  209 222 197 203   < > = values in this interval not displayed.    Lab Results  Component Value Date   INR 1.8 (H) 10/27/2020   INR 6.3 (HH) 07/17/2020   INR 2.6 (H) 06/24/2020   Medications:  . (feeding supplement) PROSource Plus  30 mL Oral BID BM  . amiodarone  200 mg Oral Daily  . apixaban  2.5 mg Oral BID  . cephALEXin  250 mg Oral Daily  . latanoprost  1 drop Both Eyes QHS  . ondansetron  4 mg Oral Once  . senna-docusate  2 tablet Oral QHS  . timolol  1 drop Both Eyes Daily    Dialysis Orders: [facility] TTS  4h   400/500  71kg  2/2 bath   P2  Hep none  RIJ TDC (AVF works but infiltrated and not using) -  Mircera 75 q 2 wks (last 5/5) No VDRA   Assessment/Plan: 1. LUE compartment syndrome: evacuation of hematoma/ repair of bleeding brachial artery pseudoaneurysm on 5/10 per Dr. Cain/VVS. Pain control per PMD.  2. ESRD - HD TTS. Orders written for HD tomorrow per regular schedule. K 3.6.  3. Hypertension/volume - BP variable but mostly well controlled. No BP meds on home list. Increased volume on exam, per weights 2-3kg over dry, had been lowering as OP as patient had been losing weight.  No indication of respiratory distress.  Titrate down as tolerated.   4. Anemia of CKD/ABLA- Hgb 6.5 at lowest here post-op. SP 2 unit prbcs 5/11. Recent ESA dose as outpatient but will be due again on Thursday 5/19, will order.  Hgb up to 8.7.  5. Metabolic bone disease -CCa 10, phos a little low. No binders or VDRA.  If phos continue to trend down will need to supplement.   6. Chronic combined CHF -- volume removal with UF on HD  7. PAFib- On Eliquis/amiodarone 8. Nutrition -Renal diet/fluid restriction. If K and phos remain low we could try a liberalized diet and monitor labs. Prot supp for low albumin 9. Dispo - CIR vs home per the patient   Jen Mow, PA-C [place] 11/02/2020,11:24 AM  LOS: 6 days

## 2020-11-02 NOTE — Progress Notes (Signed)
Inpatient Rehabilitation Admissions Coordinator  I await [employer] determination of a possible CIR admit.  Danne Baxter, RN, MSN Rehab Admissions Coordinator [phone] 11/02/2020 8:29 AM

## 2020-11-02 NOTE — Progress Notes (Signed)
Physical Therapy Treatment Patient Details Name: [name] MRN: [id number] DOB: 09-14-1945 Today's Date: 11/02/2020    History of Present Illness Pt is a 75 y.o. female who presented 5/9 with progressively worsening L UE pain with hematoma and numbness. CT left upper extremity without contrast showed findings with concern for compartment syndrome. S/p evacuation of L UE hematoma and primary repair on L brachial artery pseudoaneurysm 5/10. PMH: ESRD HD TTS, aortic valve stenosis paroxysmal A. fib on Eliquis, hx of MI, HTN, glaucoma, GERD, CHF, and malfunctioning left-sided fistula.    PT Comments    Pt reporting nausea and diarrhea today; RN premedicated and pt still agreeable to participate in therapy session. Focused on LUE AAROM exercises, functional mobility and postural re-education. Pt requiring two person mod-max assist for bed mobility and transfers to standing. Continue to recommend CIR to address deficits, maximize functional mobility and decrease caregiver burden.     Follow Up Recommendations  CIR;Supervision/Assistance - 24 hour     Equipment Recommendations  3in1 (PT)    Recommendations for Other Services       Precautions / Restrictions Precautions Precautions: Fall Precaution Comments: LUE has staples on inside of upper arm and skin tear on bottom part of arm Restrictions Weight Bearing Restrictions: No    Mobility  Bed Mobility Overal bed mobility: Needs Assistance Bed Mobility: Supine to Sit;Sit to Supine     Supine to sit: Mod assist;+2 for physical assistance Sit to supine: +2 for physical assistance;Max assist   General bed mobility comments: Pt able to assist bring BLE's off edge of bed slowly, HHA to pull up with R hand to sit up. Assist for trunk guidance and BLE elevation back into bed    Transfers Overall transfer level: Needs assistance Equipment used: Rolling walker (2 wheeled) Transfers: Sit to/from Stand Sit to Stand: Mod assist;+2  physical assistance;+2 safety/equipment         General transfer comment: On first two attempts, pt only able to achieve minimal hip clearance. Successful to stand on last two trials, pushing both hands off edge of bed, bilateral feet blocked to prevent anterior slide. Tactile/verbal cues for upright posture, decreased left lateral lean, upward gaze. Able to take side steps with significant time/effort, cues for weight shifting. Significant trunk flexion. Pt reporting "my legs are giving out."  Ambulation/Gait                 Stairs             Wheelchair Mobility    Modified Rankin (Stroke Patients Only)       Balance Overall balance assessment: Needs assistance Sitting-balance support: No upper extremity supported;Feet supported Sitting balance-Leahy Scale: Fair     Standing balance support: Bilateral upper extremity supported Standing balance-Leahy Scale: Poor Standing balance comment: Reliant on bil UE and external assist.                            Cognition Arousal/Alertness: Awake/alert Behavior During Therapy: Flat affect Overall Cognitive Status: Within Functional Limits for tasks assessed                                        Exercises General Exercises - Upper Extremity Shoulder Flexion: Left;10 reps;Supine;Limitations;AAROM Shoulder Horizontal ADduction: AAROM;Left;10 reps;Supine Elbow Flexion: AAROM;Left;10 reps;Supine Elbow Extension: AAROM;Left;10 reps;Supine Digit Composite Flexion: AROM;Left;10 reps;Supine Composite  Extension: AROM;Left;10 reps;Supine Hand Exercises Opposition: AROM;Left;Supine;5 reps Other Exercises Other Exercises: Seated: scapular retractions x 5    General Comments        Pertinent Vitals/Pain Pain Assessment: Faces Faces Pain Scale: Hurts even more Pain Location: LUE Pain Descriptors / Indicators: Numbness;Tingling Pain Intervention(s): Limited activity within patient's  tolerance;Monitored during session    Home Living                      Prior Function            PT Goals (current goals can now be found in the care plan section) Acute Rehab PT Goals Patient Stated Goal: none stated Potential to Achieve Goals: Good    Frequency    Min 3X/week      PT Plan Current plan remains appropriate    Co-evaluation              AM-PAC PT "6 Clicks" Mobility   Outcome Measure  Help needed turning from your back to your side while in a flat bed without using bedrails?: A Lot Help needed moving from lying on your back to sitting on the side of a flat bed without using bedrails?: A Lot Help needed moving to and from a bed to a chair (including a wheelchair)?: A Lot Help needed standing up from a chair using your arms (e.g., wheelchair or bedside chair)?: A Lot Help needed to walk in hospital room?: A Lot Help needed climbing 3-5 steps with a railing? : Total 6 Click Score: 11    End of Session Equipment Utilized During Treatment: Gait belt Activity Tolerance: Patient limited by fatigue Patient left: in bed;with call bell/phone within reach;with bed alarm set;with family/visitor present Nurse Communication: Mobility status PT Visit Diagnosis: Unsteadiness on feet (R26.81);Other abnormalities of gait and mobility (R26.89);Muscle weakness (generalized) (M62.81);Difficulty in walking, not elsewhere classified (R26.2);Pain Pain - Right/Left: Left Pain - part of body: Arm     Time: 2197-5883 PT Time Calculation (min) (ACUTE ONLY): 27 min  Charges:  $Therapeutic Exercise: 8-22 mins $Therapeutic Activity: 8-22 mins                     Wyona Almas, PT, DPT Acute Rehabilitation Services Pager [phone] Office [place] 11/02/2020, 1:41 PM

## 2020-11-02 NOTE — Progress Notes (Signed)
PROGRESS NOTE    ZIASIA LENOIR  ESP:[id number] DOB: 1945-09-25 DOA: 10/26/2020 PCP: Lajean Manes, MD    Brief Narrative:  75 year old F with PMH of ESRD on HD TTS, aortic valve stenosis, paroxysmal A. fib on Eliquis, malfunctioning left arm aVF and debility presented to ED with progressive LUE pain and numbness due to bleeding from malfunctioning left arm aVF with hematoma, and admitted for compartment syndrome.    Patient underwent emergent evacuation of LUE hematoma and repair of left brachial artery pseudoaneurysm by Dr. Donzetta Matters with significant improvement in her pain and near resolution of hyperesthesia. Received total 2 units of PRBC.  Hemoglobin 7.9. 5/16, will resume Eliquis.  No more bleeding.   Assessment & Plan:   Active Problems:   Compartment syndrome (HCC)  Left upper extremity compartment syndrome due to brachial artery pseudoaneurysm/malfunctioning AV fistula: Status postrepair and evacuation.  Clinically improving.  Continue elevation. Using permacath.  Pain controlled.  Mobility.   ESRD on hemodialysis TTS: Received permacath 5/10.  Receiving dialysis TTS schedule.  Chronic diastolic congestive heart failure: Fairly euvolemic.  Fluid management with dialysis.  Acute blood loss anemia with anemia of chronic disease of ESRD: Recent on hemoglobin 8.4-6.4-1 unit of PRBC 5/10-6.5-1 unit of PRBC-7.9-8.7. No current evidence of active bleeding.  Paroxysmal A. fib: Currently sinus rhythm.  On amiodarone.  Eliquis was on hold. Hemoglobin has remained stable.  No ongoing bleeding, will resume Eliquis today and monitor.  Staple removal as per surgery.  Debility: Extreme debilitated.  Work with PT OT.  Recommended acute inpatient rehab.  Waiting for bed availability.  Patient is medically stabilizing.  Refer to acute inpatient rehab.   DVT prophylaxis: apixaban (ELIQUIS) tablet 2.5 mg Start: 11/02/20 1000 Place and maintain sequential compression device Start: 10/27/20  2155   Code Status: Full code Family Communication: None today. Disposition Plan: Status is: Inpatient  Remains inpatient appropriate because:Inpatient level of care appropriate due to severity of illness   Dispo:  Patient From: Home  Planned Disposition: Inpatient Rehab   Medically stable for discharge: yes        Consultants:   Vascular surgery  Nephrology  Procedures:   Permacath 5/10  Clot evacuation left AV fistula 5/10  Antimicrobials:   None   Subjective: Patient seen and examined.  No overnight events.  Objective: Vitals:   11/02/20 0300 11/02/20 0353 11/02/20 0400 11/02/20 0839  BP:  (!) 140/56  (!) 142/55  Pulse:      Resp: 18 16 15    Temp:  98.5 F (36.9 C)  97.9 F (36.6 C)  TempSrc:  Oral  Oral  SpO2:      Weight:      Height:        Intake/Output Summary (Last 24 hours) at 11/02/2020 0931 Last data filed at 11/02/2020 0700 Gross per 24 hour  Intake 480 ml  Output 1 ml  Net 479 ml   Filed Weights   10/29/20 0754 10/29/20 1218 10/31/20 1636  Weight: 75.5 kg 73 kg 74 kg    Examination:  General: Looks fairly comfortable on room air.  Frail and debilitated and chronically sick looking.  Not in any distress. Cardiovascular: S1-S2 normal.  Right-sided permacath with some ecchymosis. Respiratory: Bilateral clear Gastrointestinal: Soft and nontender.  Bowel sounds present. Ext: No edema or cyanosis. AV fistula left forearm with some swelling, ecchymosis and improving edema. Distal neurovascular status intact. Sutures intact.  Some superficial bleeding from the skin stopped.     Data Reviewed:  I have personally reviewed following labs and imaging studies  CBC: Recent Labs  Lab 10/28/20 0457 10/28/20 1620 10/29/20 0741 10/31/20 0402 11/01/20 0132 11/02/20 0200  WBC 9.7  --  11.6* 10.1 9.4 11.3*  NEUTROABS  --   --   --  8.0* 7.6 9.3*  HGB 6.5* 8.1* 7.8* 8.7* 8.5* 8.7*  HCT 19.9* 23.8* 23.3* 26.4* 25.9* 26.8*  MCV 89.6  --   87.9 90.1 90.9 92.7  PLT 182  --  209 222 197 007   Basic Metabolic Panel: Recent Labs  Lab 10/26/20 2303 10/27/20 0854 10/28/20 0457 10/29/20 0741 10/31/20 1250  NA 136 136 135 134* 133*  K 3.5 3.6 3.7 3.8 3.6  CL 97* 97* 99 99 99  CO2 28 29 30 29 30   GLUCOSE 108* 109* 93 85 79  BUN 19 21 16  27* 23  CREATININE 4.27* 4.53* 3.36* 4.66* 3.91*  CALCIUM 8.5* 8.3* 7.9* 8.1* 8.0*  MG  --  2.0 1.7  --   --   PHOS  --  3.6 3.1 2.4* 2.2*   GFR: Estimated Creatinine Clearance: 12 mL/min (A) (by C-G formula based on SCr of 3.91 mg/dL (H)). Liver Function Tests: Recent Labs  Lab 10/26/20 2303 10/27/20 0854 10/28/20 0457 10/29/20 0741 10/31/20 1250  AST 23 22  --   --   --   ALT 12 12  --   --   --   ALKPHOS 97 74  --   --   --   BILITOT 1.5* 1.5*  --   --   --   PROT 4.5* 4.6*  --   --   --   ALBUMIN 2.0* 2.6* 1.8* 1.6* 1.5*   No results for input(s): LIPASE, AMYLASE in the last 168 hours. No results for input(s): AMMONIA in the last 168 hours. Coagulation Profile: Recent Labs  Lab 10/27/20 0854  INR 1.8*   Cardiac Enzymes: No results for input(s): CKTOTAL, CKMB, CKMBINDEX, TROPONINI in the last 168 hours. BNP (last 3 results) No results for input(s): PROBNP in the last 8760 hours. HbA1C: No results for input(s): HGBA1C in the last 72 hours. CBG: No results for input(s): GLUCAP in the last 168 hours. Lipid Profile: No results for input(s): CHOL, HDL, LDLCALC, TRIG, CHOLHDL, LDLDIRECT in the last 72 hours. Thyroid Function Tests: No results for input(s): TSH, T4TOTAL, FREET4, T3FREE, THYROIDAB in the last 72 hours. Anemia Panel: No results for input(s): VITAMINB12, FOLATE, FERRITIN, TIBC, IRON, RETICCTPCT in the last 72 hours. Sepsis Labs: No results for input(s): PROCALCITON, LATICACIDVEN in the last 168 hours.  Recent Results (from the past 240 hour(s))  Resp Panel by RT-PCR (Flu A&B, Covid) Nasopharyngeal Swab     Status: None   Collection Time: 10/27/20  2:44  AM   Specimen: Nasopharyngeal Swab; Nasopharyngeal(NP) swabs in vial transport medium  Result Value Ref Range Status   SARS Coronavirus 2 by RT PCR NEGATIVE NEGATIVE Final    Comment: (NOTE) SARS-CoV-2 target nucleic acids are NOT DETECTED.  The SARS-CoV-2 RNA is generally detectable in upper respiratory specimens during the acute phase of infection. The lowest concentration of SARS-CoV-2 viral copies this assay can detect is 138 copies/mL. A negative result does not preclude SARS-Cov-2 infection and should not be used as the sole basis for treatment or other patient management decisions. A negative result may occur with  improper specimen collection/handling, submission of specimen other than nasopharyngeal swab, presence of viral mutation(s) within the areas targeted by this assay,  and inadequate number of viral copies(<138 copies/mL). A negative result must be combined with clinical observations, patient history, and epidemiological information. The expected result is Negative.  Fact Sheet for Patients:  EntrepreneurPulse.com.au  Fact Sheet for Healthcare Providers:  IncredibleEmployment.be  This test is no t yet approved or cleared by the [place] FDA and  has been authorized for detection and/or diagnosis of SARS-CoV-2 by FDA under an Emergency Use Authorization (EUA). This EUA will remain  in effect (meaning this test can be used) for the duration of the COVID-19 declaration under Section 564(b)(1) of the Act, 21 U.S.C.section 360bbb-3(b)(1), unless the authorization is terminated  or revoked sooner.       Influenza A by PCR NEGATIVE NEGATIVE Final   Influenza B by PCR NEGATIVE NEGATIVE Final    Comment: (NOTE) The Xpert Xpress SARS-CoV-2/FLU/RSV plus assay is intended as an aid in the diagnosis of influenza from Nasopharyngeal swab specimens and should not be used as a sole basis for treatment. Nasal washings and aspirates are  unacceptable for Xpert Xpress SARS-CoV-2/FLU/RSV testing.  Fact Sheet for Patients: EntrepreneurPulse.com.au  Fact Sheet for Healthcare Providers: IncredibleEmployment.be  This test is not yet approved or cleared by the [place] FDA and has been authorized for detection and/or diagnosis of SARS-CoV-2 by FDA under an Emergency Use Authorization (EUA). This EUA will remain in effect (meaning this test can be used) for the duration of the COVID-19 declaration under Section 564(b)(1) of the Act, 21 U.S.C. section 360bbb-3(b)(1), unless the authorization is terminated or revoked.  Performed at [facility], [place] [address] [place], [place] [address]   MRSA PCR Screening     Status: None   Collection Time: 10/27/20  8:22 AM   Specimen: Nasal Mucosa; Nasopharyngeal  Result Value Ref Range Status   MRSA by PCR NEGATIVE NEGATIVE Final    Comment:        The GeneXpert MRSA Assay (FDA approved for NASAL specimens only), is one component of a comprehensive MRSA colonization surveillance program. It is not intended to diagnose MRSA infection nor to guide or monitor treatment for MRSA infections. Performed at [facility], [place] [address] [place], [place] [address]          Radiology Studies: No results found.      Scheduled Meds: . (feeding supplement) PROSource Plus  30 mL Oral BID BM  . amiodarone  200 mg Oral Daily  . apixaban  2.5 mg Oral BID  . cephALEXin  250 mg Oral Daily  . latanoprost  1 drop Both Eyes QHS  . ondansetron  4 mg Oral Once  . senna-docusate  2 tablet Oral QHS  . timolol  1 drop Both Eyes Daily   Continuous Infusions:    LOS: 6 days    Time spent: 30 minutes    Barb Merino, MD [facility] Pager [phone]

## 2020-11-03 ENCOUNTER — Encounter (HOSPITAL_COMMUNITY): Payer: Self-pay | Admitting: Physical Medicine & Rehabilitation

## 2020-11-03 ENCOUNTER — Other Ambulatory Visit: Payer: Self-pay

## 2020-11-03 ENCOUNTER — Inpatient Hospital Stay (HOSPITAL_COMMUNITY)
Admission: RE | Admit: 2020-11-03 | Discharge: 2020-12-02 | DRG: 945 | Disposition: A | Discharge status: Home Health | Payer: Medicare Other | Source: Intra-hospital | Attending: Physical Medicine & Rehabilitation | Admitting: Physical Medicine & Rehabilitation

## 2020-11-03 DIAGNOSIS — I5042 Chronic combined systolic (congestive) and diastolic (congestive) heart failure: Secondary | ICD-10-CM | POA: Diagnosis present

## 2020-11-03 DIAGNOSIS — R195 Other fecal abnormalities: Secondary | ICD-10-CM

## 2020-11-03 DIAGNOSIS — I429 Cardiomyopathy, unspecified: Secondary | ICD-10-CM | POA: Diagnosis not present

## 2020-11-03 DIAGNOSIS — K641 Second degree hemorrhoids: Secondary | ICD-10-CM | POA: Diagnosis not present

## 2020-11-03 DIAGNOSIS — R0989 Other specified symptoms and signs involving the circulatory and respiratory systems: Secondary | ICD-10-CM | POA: Diagnosis not present

## 2020-11-03 DIAGNOSIS — D631 Anemia in chronic kidney disease: Secondary | ICD-10-CM | POA: Diagnosis present

## 2020-11-03 DIAGNOSIS — Z96652 Presence of left artificial knee joint: Secondary | ICD-10-CM | POA: Diagnosis present

## 2020-11-03 DIAGNOSIS — L89152 Pressure ulcer of sacral region, stage 2: Secondary | ICD-10-CM | POA: Diagnosis not present

## 2020-11-03 DIAGNOSIS — E876 Hypokalemia: Secondary | ICD-10-CM | POA: Diagnosis not present

## 2020-11-03 DIAGNOSIS — E785 Hyperlipidemia, unspecified: Secondary | ICD-10-CM | POA: Diagnosis present

## 2020-11-03 DIAGNOSIS — D649 Anemia, unspecified: Secondary | ICD-10-CM | POA: Diagnosis not present

## 2020-11-03 DIAGNOSIS — R5381 Other malaise: Principal | ICD-10-CM | POA: Diagnosis present

## 2020-11-03 DIAGNOSIS — Z96642 Presence of left artificial hip joint: Secondary | ICD-10-CM | POA: Diagnosis present

## 2020-11-03 DIAGNOSIS — G7281 Critical illness myopathy: Secondary | ICD-10-CM | POA: Diagnosis present

## 2020-11-03 DIAGNOSIS — I129 Hypertensive chronic kidney disease with stage 1 through stage 4 chronic kidney disease, or unspecified chronic kidney disease: Secondary | ICD-10-CM | POA: Diagnosis not present

## 2020-11-03 DIAGNOSIS — I132 Hypertensive heart and chronic kidney disease with heart failure and with stage 5 chronic kidney disease, or end stage renal disease: Secondary | ICD-10-CM | POA: Diagnosis not present

## 2020-11-03 DIAGNOSIS — Z743 Need for continuous supervision: Secondary | ICD-10-CM | POA: Diagnosis not present

## 2020-11-03 DIAGNOSIS — I480 Paroxysmal atrial fibrillation: Secondary | ICD-10-CM | POA: Diagnosis present

## 2020-11-03 DIAGNOSIS — G8918 Other acute postprocedural pain: Secondary | ICD-10-CM

## 2020-11-03 DIAGNOSIS — R200 Anesthesia of skin: Secondary | ICD-10-CM | POA: Diagnosis not present

## 2020-11-03 DIAGNOSIS — N390 Urinary tract infection, site not specified: Secondary | ICD-10-CM

## 2020-11-03 DIAGNOSIS — Z7901 Long term (current) use of anticoagulants: Secondary | ICD-10-CM

## 2020-11-03 DIAGNOSIS — D72829 Elevated white blood cell count, unspecified: Secondary | ICD-10-CM | POA: Diagnosis not present

## 2020-11-03 DIAGNOSIS — Z90722 Acquired absence of ovaries, bilateral: Secondary | ICD-10-CM

## 2020-11-03 DIAGNOSIS — R531 Weakness: Secondary | ICD-10-CM | POA: Diagnosis present

## 2020-11-03 DIAGNOSIS — Z992 Dependence on renal dialysis: Secondary | ICD-10-CM | POA: Diagnosis not present

## 2020-11-03 DIAGNOSIS — F320 Major depressive disorder, single episode, mild: Secondary | ICD-10-CM | POA: Diagnosis not present

## 2020-11-03 DIAGNOSIS — N186 End stage renal disease: Secondary | ICD-10-CM

## 2020-11-03 DIAGNOSIS — E8889 Other specified metabolic disorders: Secondary | ICD-10-CM | POA: Diagnosis present

## 2020-11-03 DIAGNOSIS — Z90710 Acquired absence of both cervix and uterus: Secondary | ICD-10-CM | POA: Diagnosis not present

## 2020-11-03 DIAGNOSIS — M21372 Foot drop, left foot: Secondary | ICD-10-CM | POA: Diagnosis not present

## 2020-11-03 DIAGNOSIS — Z79899 Other long term (current) drug therapy: Secondary | ICD-10-CM | POA: Diagnosis not present

## 2020-11-03 DIAGNOSIS — K219 Gastro-esophageal reflux disease without esophagitis: Secondary | ICD-10-CM | POA: Diagnosis present

## 2020-11-03 DIAGNOSIS — D62 Acute posthemorrhagic anemia: Secondary | ICD-10-CM | POA: Diagnosis present

## 2020-11-03 DIAGNOSIS — K649 Unspecified hemorrhoids: Secondary | ICD-10-CM | POA: Diagnosis present

## 2020-11-03 HISTORY — DX: Critical illness myopathy: G72.81

## 2020-11-03 LAB — CBC WITH DIFFERENTIAL/PLATELET
Abs Immature Granulocytes: 0.14 10*3/uL — ABNORMAL HIGH (ref 0.00–0.07)
Basophils Absolute: 0.1 10*3/uL (ref 0.0–0.1)
Basophils Relative: 1 %
Eosinophils Absolute: 0.3 10*3/uL (ref 0.0–0.5)
Eosinophils Relative: 3 %
HCT: 28.4 % — ABNORMAL LOW (ref 36.0–46.0)
Hemoglobin: 9.2 g/dL — ABNORMAL LOW (ref 12.0–15.0)
Immature Granulocytes: 1 %
Lymphocytes Relative: 6 %
Lymphs Abs: 0.7 10*3/uL (ref 0.7–4.0)
MCH: 30.2 pg (ref 26.0–34.0)
MCHC: 32.4 g/dL (ref 30.0–36.0)
MCV: 93.1 fL (ref 80.0–100.0)
Monocytes Absolute: 0.8 10*3/uL (ref 0.1–1.0)
Monocytes Relative: 8 %
Neutro Abs: 8.7 10*3/uL — ABNORMAL HIGH (ref 1.7–7.7)
Neutrophils Relative %: 81 %
Platelets: 227 10*3/uL (ref 150–400)
RBC: 3.05 MIL/uL — ABNORMAL LOW (ref 3.87–5.11)
RDW: 23.5 % — ABNORMAL HIGH (ref 11.5–15.5)
WBC: 10.7 10*3/uL — ABNORMAL HIGH (ref 4.0–10.5)
nRBC: 0 % (ref 0.0–0.2)

## 2020-11-03 LAB — RENAL FUNCTION PANEL
Albumin: 1.5 g/dL — ABNORMAL LOW (ref 3.5–5.0)
Anion gap: 8 (ref 5–15)
BUN: 35 mg/dL — ABNORMAL HIGH (ref 8–23)
CO2: 27 mmol/L (ref 22–32)
Calcium: 8.2 mg/dL — ABNORMAL LOW (ref 8.9–10.3)
Chloride: 98 mmol/L (ref 98–111)
Creatinine, Ser: 5.14 mg/dL — ABNORMAL HIGH (ref 0.44–1.00)
GFR, Estimated: 8 mL/min — ABNORMAL LOW (ref >60)
Glucose, Bld: 105 mg/dL — ABNORMAL HIGH (ref 70–99)
Phosphorus: 3 mg/dL (ref 2.5–4.6)
Potassium: 3.7 mmol/L (ref 3.5–5.1)
Sodium: 133 mmol/L — ABNORMAL LOW (ref 135–145)

## 2020-11-03 MED ORDER — ALUMINUM HYDROXIDE GEL 320 MG/5ML PO SUSP
10.0000 mL | Freq: Four times a day (QID) | ORAL | Status: DC | PRN
  Filled 2020-11-03: qty 30

## 2020-11-03 MED ORDER — PENTAFLUOROPROP-TETRAFLUOROETH EX AERO: 1.0000 "application " | INHALATION_SPRAY | CUTANEOUS | Status: DC | PRN

## 2020-11-03 MED ORDER — LORAZEPAM 0.5 MG PO TABS
0.5000 mg | ORAL_TABLET | Freq: Every day | ORAL | Status: DC | PRN
  Administered 2020-11-03 – 2020-11-15 (×2): 0.5 mg via ORAL
  Filled 2020-11-03 (×2): qty 1

## 2020-11-03 MED ORDER — PROCHLORPERAZINE EDISYLATE 10 MG/2ML IJ SOLN
5.0000 mg | Freq: Four times a day (QID) | INTRAMUSCULAR | Status: DC | PRN
  Administered 2020-11-08: 10 mg via INTRAMUSCULAR
  Filled 2020-11-03: qty 2

## 2020-11-03 MED ORDER — LOPERAMIDE HCL 2 MG PO TABS: 2.0000 mg | ORAL_TABLET | Freq: Four times a day (QID) | ORAL | Status: DC | PRN

## 2020-11-03 MED ORDER — TIMOLOL HEMIHYDRATE 0.5 % OP SOLN: 1.0000 [drp] | Freq: Every day | OPHTHALMIC | Status: DC

## 2020-11-03 MED ORDER — FAMOTIDINE 20 MG PO TABS: 20.0000 mg | ORAL_TABLET | Freq: Two times a day (BID) | ORAL | Status: DC

## 2020-11-03 MED ORDER — LOPERAMIDE HCL 2 MG PO CAPS
2.0000 mg | ORAL_CAPSULE | Freq: Four times a day (QID) | ORAL | Status: DC | PRN
  Administered 2020-11-03 – 2020-11-05 (×6): 2 mg via ORAL
  Filled 2020-11-03 (×6): qty 1

## 2020-11-03 MED ORDER — TRAMADOL HCL 50 MG PO TABS: 50.0000 mg | ORAL_TABLET | Freq: Four times a day (QID) | ORAL | Status: DC | PRN

## 2020-11-03 MED ORDER — GABAPENTIN 100 MG PO CAPS
100.0000 mg | ORAL_CAPSULE | Freq: Every day | ORAL | Status: DC
  Administered 2020-11-03 – 2020-11-05 (×3): 100 mg via ORAL
  Filled 2020-11-03 (×3): qty 1

## 2020-11-03 MED ORDER — AMIODARONE HCL 200 MG PO TABS
200.0000 mg | ORAL_TABLET | Freq: Every day | ORAL | Status: DC
  Administered 2020-11-03 – 2020-12-02 (×30): 200 mg via ORAL
  Filled 2020-11-03 (×30): qty 1

## 2020-11-03 MED ORDER — MILK AND MOLASSES ENEMA
1.0000 | Freq: Every day | RECTAL | Status: DC | PRN
  Filled 2020-11-03: qty 240

## 2020-11-03 MED ORDER — NEPRO/CARBSTEADY PO LIQD
237.0000 mL | Freq: Two times a day (BID) | ORAL | Status: DC
  Administered 2020-11-03 – 2020-11-26 (×13): 237 mL via ORAL

## 2020-11-03 MED ORDER — ACETAMINOPHEN 325 MG PO TABS
325.0000 mg | ORAL_TABLET | ORAL | Status: DC | PRN
  Administered 2020-11-05 – 2020-11-09 (×7): 650 mg via ORAL
  Administered 2020-11-09: 325 mg via ORAL
  Administered 2020-11-09 – 2020-11-10 (×4): 650 mg via ORAL
  Administered 2020-11-11: 325 mg via ORAL
  Administered 2020-11-11 – 2020-11-22 (×25): 650 mg via ORAL
  Administered 2020-11-22: 325 mg via ORAL
  Administered 2020-11-22 – 2020-12-02 (×29): 650 mg via ORAL
  Filled 2020-11-03 (×71): qty 2

## 2020-11-03 MED ORDER — CEPHALEXIN 250 MG PO CAPS
250.0000 mg | ORAL_CAPSULE | Freq: Every day | ORAL | Status: DC
  Administered 2020-11-03 – 2020-12-01 (×27): 250 mg via ORAL
  Filled 2020-11-03 (×27): qty 1

## 2020-11-03 MED ORDER — ONDANSETRON HCL 4 MG PO TABS
4.0000 mg | ORAL_TABLET | Freq: Three times a day (TID) | ORAL | Status: DC | PRN
  Administered 2020-11-05 – 2020-11-27 (×6): 4 mg via ORAL
  Filled 2020-11-03 (×6): qty 1

## 2020-11-03 MED ORDER — SODIUM CHLORIDE 0.9 % IV SOLN: 100.0000 mL | INTRAVENOUS | Status: DC | PRN

## 2020-11-03 MED ORDER — BISACODYL 10 MG RE SUPP: 10.0000 mg | Freq: Every day | RECTAL | Status: DC | PRN

## 2020-11-03 MED ORDER — SIMETHICONE 80 MG PO CHEW: 80.0000 mg | CHEWABLE_TABLET | Freq: Four times a day (QID) | ORAL | Status: DC | PRN

## 2020-11-03 MED ORDER — TIMOLOL MALEATE 0.5 % OP SOLN
1.0000 [drp] | Freq: Every day | OPHTHALMIC | Status: DC
  Administered 2020-11-03 – 2020-12-02 (×30): 1 [drp] via OPHTHALMIC
  Filled 2020-11-03: qty 5

## 2020-11-03 MED ORDER — CEPHALEXIN 250 MG PO CAPS
250.0000 mg | ORAL_CAPSULE | Freq: Every day | ORAL | Status: AC
  Administered 2020-11-03: 250 mg via ORAL
  Filled 2020-11-03: qty 1

## 2020-11-03 MED ORDER — OXYCODONE HCL 5 MG PO TABS
5.0000 mg | ORAL_TABLET | ORAL | Status: DC | PRN
  Administered 2020-11-03 – 2020-11-26 (×40): 5 mg via ORAL
  Filled 2020-11-03 (×44): qty 1

## 2020-11-03 MED ORDER — LATANOPROST 0.005 % OP SOLN
1.0000 [drp] | Freq: Every day | OPHTHALMIC | Status: DC
  Administered 2020-11-03 – 2020-12-01 (×28): 1 [drp] via OPHTHALMIC
  Filled 2020-11-03: qty 2.5

## 2020-11-03 MED ORDER — GUAIFENESIN-DM 100-10 MG/5ML PO SYRP
5.0000 mL | ORAL_SOLUTION | Freq: Four times a day (QID) | ORAL | Status: DC | PRN
  Administered 2020-11-12: 10 mL via ORAL
  Filled 2020-11-03: qty 10

## 2020-11-03 MED ORDER — FAMOTIDINE 20 MG PO TABS
20.0000 mg | ORAL_TABLET | Freq: Every day | ORAL | Status: DC
  Administered 2020-11-03 – 2020-12-02 (×30): 20 mg via ORAL
  Filled 2020-11-03 (×30): qty 1

## 2020-11-03 MED ORDER — LIDOCAINE-PRILOCAINE 2.5-2.5 % EX CREA
1.0000 "application " | TOPICAL_CREAM | CUTANEOUS | Status: DC
  Administered 2020-11-04 – 2020-11-25 (×5): 1 via TOPICAL
  Filled 2020-11-03 (×2): qty 5

## 2020-11-03 MED ORDER — LIDOCAINE HCL (PF) 1 % IJ SOLN: 5.0000 mL | INTRAMUSCULAR | Status: DC | PRN

## 2020-11-03 MED ORDER — ALTEPLASE 2 MG IJ SOLR: 2.0000 mg | Freq: Once | INTRAMUSCULAR | Status: DC | PRN

## 2020-11-03 MED ORDER — PROCHLORPERAZINE MALEATE 5 MG PO TABS: 5.0000 mg | ORAL_TABLET | Freq: Four times a day (QID) | ORAL | Status: DC | PRN

## 2020-11-03 MED ORDER — PROCHLORPERAZINE 25 MG RE SUPP: 12.5000 mg | Freq: Four times a day (QID) | RECTAL | Status: DC | PRN

## 2020-11-03 MED ORDER — DIPHENHYDRAMINE HCL 12.5 MG/5ML PO ELIX: 12.5000 mg | ORAL_SOLUTION | Freq: Four times a day (QID) | ORAL | Status: DC | PRN

## 2020-11-03 MED ORDER — SACCHAROMYCES BOULARDII 250 MG PO CAPS
250.0000 mg | ORAL_CAPSULE | Freq: Two times a day (BID) | ORAL | Status: DC
  Administered 2020-11-03 – 2020-12-02 (×57): 250 mg via ORAL
  Filled 2020-11-03 (×58): qty 1

## 2020-11-03 MED ORDER — TRAZODONE HCL 50 MG PO TABS
25.0000 mg | ORAL_TABLET | Freq: Every evening | ORAL | Status: DC | PRN
  Administered 2020-11-08 – 2020-11-20 (×4): 50 mg via ORAL
  Filled 2020-11-03 (×6): qty 1

## 2020-11-03 MED ORDER — HEPARIN SODIUM (PORCINE) 1000 UNIT/ML DIALYSIS: 1000.0000 [IU] | INTRAMUSCULAR | Status: DC | PRN

## 2020-11-03 MED ORDER — POLYETHYLENE GLYCOL 3350 17 G PO PACK: 17.0000 g | PACK | Freq: Every day | ORAL | Status: DC | PRN

## 2020-11-03 MED ORDER — APIXABAN 2.5 MG PO TABS
2.5000 mg | ORAL_TABLET | Freq: Two times a day (BID) | ORAL | Status: DC
  Administered 2020-11-03 – 2020-12-02 (×58): 2.5 mg via ORAL
  Filled 2020-11-03 (×58): qty 1

## 2020-11-03 MED ORDER — LIDOCAINE-PRILOCAINE 2.5-2.5 % EX CREA: 1.0000 "application " | TOPICAL_CREAM | CUTANEOUS | Status: DC | PRN

## 2020-11-03 MED ORDER — ENOXAPARIN SODIUM 40 MG/0.4ML IJ SOSY: 40.0000 mg | PREFILLED_SYRINGE | INTRAMUSCULAR | Status: DC

## 2020-11-03 NOTE — Discharge Summary (Signed)
Physician Discharge Summary  [name] FTD:[id number] DOB: 28-Oct-1945 DOA: 10/26/2020  PCP: Lajean Manes, MD  Admit date: 10/26/2020 Discharge date: 11/03/2020  Admitted From: Home Disposition: Acute inpatient rehab  Recommendations for Outpatient Follow-up:  1. Follow up with PCP in 1-2 weeks after discharge 2. Please obtain CBC in 3 days to evaluate for blood loss. 3. Continue hemodialysis as scheduled.  Home Health: Not applicable Equipment/Devices: Not applicable  Discharge Condition: Stable CODE STATUS: Full code Diet recommendation: Low-salt diet  Discharge summary: 75 year old F with PMH of ESRD on HD TTS, aortic valve stenosis, paroxysmal A. fib on Eliquis, malfunctioning left arm aVF and debility presented to ED with progressive LUE pain and numbness due to bleeding from malfunctioning left arm aVF with hematoma,and admitted for compartment syndrome.   Patient underwent emergent evacuation of LUE hematoma and repair of left brachial artery pseudoaneurysm by Dr. Donzetta Matters with significant improvement in her pain and near resolution of hyperesthesia. Received total 2 units of PRBC.  Hemoglobin 7.9. 5/16, resumed Eliquis.  No more bleeding.   Assessment & Plan of care:   Left upper extremity compartment syndrome due to brachial artery pseudoaneurysm/malfunctioning AV fistula: Status postrepair and evacuation.  Clinically improving.  Continue elevation. Using permacath.  Pain controlled.  Mobility.  Patient has staples in place, vascular surgery will follow up.  ESRD on hemodialysis TTS: Received permacath 5/10.  Receiving dialysis TTS schedule.  Received dialysis today.  Chronic diastolic congestive heart failure: Fairly euvolemic.  Fluid management with dialysis.  Acute blood loss anemia with anemia of chronic disease of ESRD: Recent on hemoglobin 8.4-6.4-1 unit of PRBC 5/10-6.5-1 unit of PRBC-7.9-8.7. No current evidence of active bleeding.  Please recheck in 3  days as we started her on Eliquis.  Paroxysmal A. fib: Currently sinus rhythm.  On amiodarone.  Eliquis was on hold. Hemoglobin has remained stable.  No ongoing bleeding, Eliquis resumed. Staple removal as per surgery.  Debility: Extreme debilitated.  Work with PT OT.  Recommended acute inpatient rehab.    Patient is medically stable to transfer to acute inpatient rehab for multidisciplinary rehab and medical management before discharge home.   Discharge Diagnoses:  Active Problems:   Compartment syndrome [facility])    Discharge Instructions  Discharge Instructions    Diet - low sodium heart healthy   Complete by: As directed    Increase activity slowly   Complete by: As directed    No wound care   Complete by: As directed      Allergies as of 11/03/2020      Reactions   Astemizole Nausea And Vomiting   Fluorouracil Rash   Vicodin [hydrocodone-acetaminophen] Nausea And Vomiting   Chlorhexidine Rash   Sunburn    rash   Percocet [oxycodone-acetaminophen] Nausea And Vomiting      Medication List    STOP taking these medications   calcium acetate 667 MG capsule Commonly known as: PHOSLO     TAKE these medications   acetaminophen 325 MG tablet Commonly known as: TYLENOL Take 2 tablets (650 mg total) by mouth every 6 (six) hours as needed for mild pain, fever or headache.   amiodarone 200 MG tablet Commonly known as: PACERONE Take 1 tablet (200 mg total) by mouth daily.   apixaban 2.5 MG Tabs tablet Commonly known as: Eliquis Take 1 tablet (2.5 mg total) by mouth 2 (two) times daily.   bimatoprost 0.01 % Soln Commonly known as: LUMIGAN Place 1 drop into both eyes at bedtime.  cephALEXin 250 MG capsule Commonly known as: KEFLEX Take 250 mg by mouth daily.   famotidine 20 MG tablet Commonly known as: PEPCID Take 1 tablet (20 mg total) by mouth 2 (two) times daily.   feeding supplement (NEPRO CARB STEADY) Liqd Take 237 mLs by mouth 2 (two) times daily  between meals. What changed:   when to take this  reasons to take this   lidocaine-prilocaine cream Commonly known as: EMLA Apply 1 application topically 3 (three) times a week.   loperamide 2 MG tablet Commonly known as: IMODIUM A-D Take 2 mg by mouth 4 (four) times daily as needed for diarrhea or loose stools.   LORazepam 0.5 MG tablet Commonly known as: ATIVAN Take 0.5 mg by mouth daily as needed for anxiety (or panic attack).   multivitamin Tabs tablet Take 1 tablet by mouth at bedtime.   ondansetron 4 MG tablet Commonly known as: ZOFRAN Take 4 mg by mouth every 8 (eight) hours as needed for nausea or vomiting.   saccharomyces boulardii 250 MG capsule Commonly known as: FLORASTOR Take 1 capsule (250 mg total) by mouth 2 (two) times daily.   timolol 0.5 % ophthalmic solution Commonly known as: BETIMOL Place 1 drop into both eyes daily.       Follow-up Information    [facility] On 11/20/2020.   Specialty: Vascular Surgery Why: appt time is 11:15am Contact information: [place] [address] [phone]             Allergies  Allergen Reactions  . Astemizole Nausea And Vomiting  . Fluorouracil Rash  . Vicodin [Hydrocodone-Acetaminophen] Nausea And Vomiting  . Chlorhexidine Rash    Sunburn    rash  . Percocet [Oxycodone-Acetaminophen] Nausea And Vomiting    Consultations:  Vascular surgery  Nephrology   Procedures/Studies: CT Extrem Up Entire Arm L WO/CM  Result Date: 10/27/2020 CLINICAL DATA:  Rupture of arm fistula few days ago with interval increase in arm circumference. Marked edema and discoloration of the arm. Concern for compartment syndrome. Unable to insert IV. EXAM: CT OF THE UPPER LEFT EXTREMITY WITHOUT CONTRAST TECHNIQUE: Multidetector CT imaging of the upper left extremity was performed according to the standard protocol. COMPARISON:  None. FINDINGS: Bones/Joint/Cartilage No  acute displaced fracture or dislocation. No cortical erosion or destruction. Partially visualized left hip arthroplasty. Ligaments Suboptimally assessed by CT. Muscles and Tendons Large hypo to isodense lesion within the biceps muscle extending at least 17 cm in the craniocaudal dimension and measuring up to approximately 6 x 6 cm on axial images (5:72, 11:15). Soft tissues Extensive subcutaneus soft tissue edema of the entire left upper extremity as well as left chest with associated areas of dermal thickening. No subcutaneus soft tissue emphysema. Medial to the biceps muscle along the distal arm there is a fluid hypodense collection measuring approximately 4.5 x 2 cm on axial imaging that appears external to the musculature and is likely associated with the fistula. Unable to evaluate vasculature on this noncontrast study. Unable to evaluate known left upper extremity fistula. Anasarca noted. Other: Right chest wall central catheter with tip terminating at the superior cavoatrial junction. Coronary calcifications. Trace small volume left pleural effusion with associated passive atelectasis left lower lobe. Prominent cardiac size with suggestion of anemia given hyperdensity of the myocardium compared to the cardiac chambers. Large hiatal hernia. Atrophic kidneys. At least trace volume ascites. Colonic diverticulosis. Atherosclerotic plaque of the aorta. Question axillary lymphadenopathy on the left. IMPRESSION: 1.  Finding likely represent interval development of a large intramuscular biceps hematoma (6 x 6 x 17 cm). Associated extensive left upper extremity edema. Underlying mass lesion/tumor not excluded. Compartment syndrome is probable in the setting. 2. Medial to the biceps muscle along the distal arm there is a fluid hypodense collection measuring approximately 4.5 x 2 cm that appears external to the muscle and is likely associated with the fistula. Markedly limited evaluation of a known fistula due to  noncontrast study. 3. Left trace to small volume pleural effusion with associated atelectasis of the left lower lobe. 4. Question associated axillary lymphadenopathy on the left. 5. Other imaging findings of potential clinical significance: Large hiatal hernia. Volume overload with anasarca and at least trace volume ascites. Aortic Atherosclerosis (ICD10-I70.0). These results were called by telephone at the time of interpretation on 10/27/2020 at 1:35 am to provider [facility] , who verbally acknowledged these results. Electronically Signed   By: Iven Finn M.D.   On: 10/27/2020 01:47    (Echo, Carotid, EGD, Colonoscopy, ERCP)    Subjective: Patient seen and examined.  Receiving dialysis today.  She knows she is going to rehab after dialysis.  Denies any complaints. Patient had some diarrhea and Imodium helped.  Denies any nausea vomiting or abdominal pain.   Discharge Exam: Vitals:   11/03/20 0930 11/03/20 1000  BP: (!) 102/48 (!) 87/48  Pulse: 70 69  Resp:    Temp:    SpO2:     Vitals:   11/03/20 0816 11/03/20 0900 11/03/20 0930 11/03/20 1000  BP: (!) 127/57 (!) 97/49 (!) 102/48 (!) 87/48  Pulse: 71 70 70 69  Resp:      Temp:      TempSrc:      SpO2: 98%     Weight:      Height:        General: Pt is alert, awake, not in acute distress Currently receiving dialysis.  Not in any distress.  Looks fairly comfortable. Frail and debilitated. Cardiovascular: RRR, S1/S2 +, no rubs, no gallops Respiratory: CTA bilaterally, no wheezing, no rhonchi Patient has right chest wall permacath, ecchymosis all over the chest wall. Abdominal: Soft, NT, ND, bowel sounds + Extremities: no edema, no cyanosis Left upper extremity with ecchymosis, fading hematoma and swelling.  AV fistula with thrill.    The results of significant diagnostics from this hospitalization (including imaging, microbiology, ancillary and laboratory) are listed below for reference.     Microbiology: Recent  Results (from the past 240 hour(s))  Resp Panel by RT-PCR (Flu A&B, Covid) Nasopharyngeal Swab     Status: None   Collection Time: 10/27/20  2:44 AM   Specimen: Nasopharyngeal Swab; Nasopharyngeal(NP) swabs in vial transport medium  Result Value Ref Range Status   SARS Coronavirus 2 by RT PCR NEGATIVE NEGATIVE Final    Comment: (NOTE) SARS-CoV-2 target nucleic acids are NOT DETECTED.  The SARS-CoV-2 RNA is generally detectable in upper respiratory specimens during the acute phase of infection. The lowest concentration of SARS-CoV-2 viral copies this assay can detect is 138 copies/mL. A negative result does not preclude SARS-Cov-2 infection and should not be used as the sole basis for treatment or other patient management decisions. A negative result may occur with  improper specimen collection/handling, submission of specimen other than nasopharyngeal swab, presence of viral mutation(s) within the areas targeted by this assay, and inadequate number of viral copies(<138 copies/mL). A negative result must be combined with clinical observations, patient history, and epidemiological information.  The expected result is Negative.  Fact Sheet for Patients:  EntrepreneurPulse.com.au  Fact Sheet for Healthcare Providers:  IncredibleEmployment.be  This test is no t yet approved or cleared by the [place] FDA and  has been authorized for detection and/or diagnosis of SARS-CoV-2 by FDA under an Emergency Use Authorization (EUA). This EUA will remain  in effect (meaning this test can be used) for the duration of the COVID-19 declaration under Section 564(b)(1) of the Act, 21 U.S.C.section 360bbb-3(b)(1), unless the authorization is terminated  or revoked sooner.       Influenza A by PCR NEGATIVE NEGATIVE Final   Influenza B by PCR NEGATIVE NEGATIVE Final    Comment: (NOTE) The Xpert Xpress SARS-CoV-2/FLU/RSV plus assay is intended as an aid in the  diagnosis of influenza from Nasopharyngeal swab specimens and should not be used as a sole basis for treatment. Nasal washings and aspirates are unacceptable for Xpert Xpress SARS-CoV-2/FLU/RSV testing.  Fact Sheet for Patients: EntrepreneurPulse.com.au  Fact Sheet for Healthcare Providers: IncredibleEmployment.be  This test is not yet approved or cleared by the [place] FDA and has been authorized for detection and/or diagnosis of SARS-CoV-2 by FDA under an Emergency Use Authorization (EUA). This EUA will remain in effect (meaning this test can be used) for the duration of the COVID-19 declaration under Section 564(b)(1) of the Act, 21 U.S.C. section 360bbb-3(b)(1), unless the authorization is terminated or revoked.  Performed at [facility], [place] [address], [place], [place] [address]   MRSA PCR Screening     Status: None   Collection Time: 10/27/20  8:22 AM   Specimen: Nasal Mucosa; Nasopharyngeal  Result Value Ref Range Status   MRSA by PCR NEGATIVE NEGATIVE Final    Comment:        The GeneXpert MRSA Assay (FDA approved for NASAL specimens only), is one component of a comprehensive MRSA colonization surveillance program. It is not intended to diagnose MRSA infection nor to guide or monitor treatment for MRSA infections. Performed at [facility], [place] [address], [place], [place] [address]      Labs: BNP (last 3 results) Recent Labs    06/08/20 0525  BNP 6,144.3*   Basic Metabolic Panel: Recent Labs  Lab 10/28/20 0457 10/29/20 0741 10/31/20 1250 11/03/20 0843  NA 135 134* 133* 133*  K 3.7 3.8 3.6 3.7  CL 99 99 99 98  CO2 30 29 30 27   GLUCOSE 93 85 79 105*  BUN 16 27* 23 35*  CREATININE 3.36* 4.66* 3.91* 5.14*  CALCIUM 7.9* 8.1* 8.0* 8.2*  MG 1.7  --   --   --   PHOS 3.1 2.4* 2.2* 3.0   Liver Function Tests: Recent Labs  Lab 10/28/20 0457 10/29/20 0741 10/31/20 1250 11/03/20 0843  ALBUMIN  1.8* 1.6* 1.5* 1.5*   No results for input(s): LIPASE, AMYLASE in the last 168 hours. No results for input(s): AMMONIA in the last 168 hours. CBC: Recent Labs  Lab 10/29/20 0741 10/31/20 0402 11/01/20 0132 11/02/20 0200 11/03/20 0110  WBC 11.6* 10.1 9.4 11.3* 10.7*  NEUTROABS  --  8.0* 7.6 9.3* 8.7*  HGB 7.8* 8.7* 8.5* 8.7* 9.2*  HCT 23.3* 26.4* 25.9* 26.8* 28.4*  MCV 87.9 90.1 90.9 92.7 93.1  PLT 209 222 197 203 227   Cardiac Enzymes: No results for input(s): CKTOTAL, CKMB, CKMBINDEX, TROPONINI in the last 168 hours. BNP: Invalid input(s): POCBNP CBG: No results for input(s): GLUCAP in the last 168 hours. D-Dimer No  results for input(s): DDIMER in the last 72 hours. Hgb A1c No results for input(s): HGBA1C in the last 72 hours. Lipid Profile No results for input(s): CHOL, HDL, LDLCALC, TRIG, CHOLHDL, LDLDIRECT in the last 72 hours. Thyroid function studies No results for input(s): TSH, T4TOTAL, T3FREE, THYROIDAB in the last 72 hours.  Invalid input(s): FREET3 Anemia work up No results for input(s): VITAMINB12, FOLATE, FERRITIN, TIBC, IRON, RETICCTPCT in the last 72 hours. Urinalysis    Component Value Date/Time   COLORURINE RED (A) 07/17/2020 1001   APPEARANCEUR TURBID (A) 07/17/2020 1001   LABSPEC  07/17/2020 1001    TEST NOT REPORTED DUE TO COLOR INTERFERENCE OF URINE PIGMENT   PHURINE  07/17/2020 1001    TEST NOT REPORTED DUE TO COLOR INTERFERENCE OF URINE PIGMENT   GLUCOSEU (A) 07/17/2020 1001    TEST NOT REPORTED DUE TO COLOR INTERFERENCE OF URINE PIGMENT   HGBUR (A) 07/17/2020 1001    TEST NOT REPORTED DUE TO COLOR INTERFERENCE OF URINE PIGMENT   BILIRUBINUR (A) 07/17/2020 1001    TEST NOT REPORTED DUE TO COLOR INTERFERENCE OF URINE PIGMENT   BILIRUBINUR neg 09/18/2014 0920   KETONESUR (A) 07/17/2020 1001    TEST NOT REPORTED DUE TO COLOR INTERFERENCE OF URINE PIGMENT   PROTEINUR (A) 07/17/2020 1001    TEST NOT REPORTED DUE TO COLOR INTERFERENCE OF URINE  PIGMENT   UROBILINOGEN 0.2 05/02/2015 2020   NITRITE (A) 07/17/2020 1001    TEST NOT REPORTED DUE TO COLOR INTERFERENCE OF URINE PIGMENT   LEUKOCYTESUR (A) 07/17/2020 1001    TEST NOT REPORTED DUE TO COLOR INTERFERENCE OF URINE PIGMENT   Sepsis Labs Invalid input(s): PROCALCITONIN,  WBC,  LACTICIDVEN Microbiology Recent Results (from the past 240 hour(s))  Resp Panel by RT-PCR (Flu A&B, Covid) Nasopharyngeal Swab     Status: None   Collection Time: 10/27/20  2:44 AM   Specimen: Nasopharyngeal Swab; Nasopharyngeal(NP) swabs in vial transport medium  Result Value Ref Range Status   SARS Coronavirus 2 by RT PCR NEGATIVE NEGATIVE Final    Comment: (NOTE) SARS-CoV-2 target nucleic acids are NOT DETECTED.  The SARS-CoV-2 RNA is generally detectable in upper respiratory specimens during the acute phase of infection. The lowest concentration of SARS-CoV-2 viral copies this assay can detect is 138 copies/mL. A negative result does not preclude SARS-Cov-2 infection and should not be used as the sole basis for treatment or other patient management decisions. A negative result may occur with  improper specimen collection/handling, submission of specimen other than nasopharyngeal swab, presence of viral mutation(s) within the areas targeted by this assay, and inadequate number of viral copies(<138 copies/mL). A negative result must be combined with clinical observations, patient history, and epidemiological information. The expected result is Negative.  Fact Sheet for Patients:  EntrepreneurPulse.com.au  Fact Sheet for Healthcare Providers:  IncredibleEmployment.be  This test is no t yet approved or cleared by the [place] FDA and  has been authorized for detection and/or diagnosis of SARS-CoV-2 by FDA under an Emergency Use Authorization (EUA). This EUA will remain  in effect (meaning this test can be used) for the duration of the COVID-19  declaration under Section 564(b)(1) of the Act, 21 U.S.C.section 360bbb-3(b)(1), unless the authorization is terminated  or revoked sooner.       Influenza A by PCR NEGATIVE NEGATIVE Final   Influenza B by PCR NEGATIVE NEGATIVE Final    Comment: (NOTE) The Xpert Xpress SARS-CoV-2/FLU/RSV plus assay is intended as an aid in  the diagnosis of influenza from Nasopharyngeal swab specimens and should not be used as a sole basis for treatment. Nasal washings and aspirates are unacceptable for Xpert Xpress SARS-CoV-2/FLU/RSV testing.  Fact Sheet for Patients: EntrepreneurPulse.com.au  Fact Sheet for Healthcare Providers: IncredibleEmployment.be  This test is not yet approved or cleared by the [place] FDA and has been authorized for detection and/or diagnosis of SARS-CoV-2 by FDA under an Emergency Use Authorization (EUA). This EUA will remain in effect (meaning this test can be used) for the duration of the COVID-19 declaration under Section 564(b)(1) of the Act, 21 U.S.C. section 360bbb-3(b)(1), unless the authorization is terminated or revoked.  Performed at [facility], [place] [address] [place], [place] [address]   MRSA PCR Screening     Status: None   Collection Time: 10/27/20  8:22 AM   Specimen: Nasal Mucosa; Nasopharyngeal  Result Value Ref Range Status   MRSA by PCR NEGATIVE NEGATIVE Final    Comment:        The GeneXpert MRSA Assay (FDA approved for NASAL specimens only), is one component of a comprehensive MRSA colonization surveillance program. It is not intended to diagnose MRSA infection nor to guide or monitor treatment for MRSA infections. Performed at [facility], [place] [address] [place], [place] [address]      Time coordinating discharge:  35 minutes  SIGNED:   Barb Merino, MD  [facility] 11/03/2020, 10:26 AM

## 2020-11-03 NOTE — Progress Notes (Signed)
Met with the patient to introduce self and role of the nurse CM. Initiated education on rehab plan of care and management of HF, HTN and wound care. Reviewed zone tool, regular weight monitoring, fluid restriction and renal diet. Patient noted she was very familiar with all of the above. Continue to follow along to discharge to address educational needs and collaborate with the SW to facilitate prep for discharge.Margarito Liner

## 2020-11-03 NOTE — Progress Notes (Signed)
[facility] Progress Note   Subjective:   Patient seen and examined at bedside in dialysis.  Arm pain better controlled today.  Denies CP, SOB, and n/v.  Plans to go to CIR when bed available.   Objective Vitals:   11/03/20 0930 11/03/20 1000 11/03/20 1030 11/03/20 1100  BP: (!) 102/48 (!) 87/48 (!) 99/52 (!) 125/54  Pulse: 70 69 69 71  Resp:      Temp:      TempSrc:      SpO2:      Weight:      Height:       Physical Exam General:chroncially ill appearing female in NAD Heart:RRR, +2/7 systolic murmur Lungs:CTAB anteriorly, nml WOB Abdomen:soft, NTND Extremities:1-2+ LE edema, LUE edema/bruising, dressing in place over AVF Dialysis Access: [facility] in use,  LU AVF +b/t  Filed Weights   10/29/20 1218 10/31/20 1636 11/03/20 0814  Weight: 73 kg 74 kg 75.5 kg    Intake/Output Summary (Last 24 hours) at 11/03/2020 1139 Last data filed at 11/03/2020 0700 Gross per 24 hour  Intake 240 ml  Output 0 ml  Net 240 ml    Additional Objective Labs: Basic Metabolic Panel: Recent Labs  Lab 10/29/20 0741 10/31/20 1250 11/03/20 0843  NA 134* 133* 133*  K 3.8 3.6 3.7  CL 99 99 98  CO2 29 30 27   GLUCOSE 85 79 105*  BUN 27* 23 35*  CREATININE 4.66* 3.91* 5.14*  CALCIUM 8.1* 8.0* 8.2*  PHOS 2.4* 2.2* 3.0   Liver Function Tests: Recent Labs  Lab 10/29/20 0741 10/31/20 1250 11/03/20 0843  ALBUMIN 1.6* 1.5* 1.5*   CBC: Recent Labs  Lab 10/29/20 0741 10/29/20 0741 10/31/20 0402 11/01/20 0132 11/02/20 0200 11/03/20 0110  WBC 11.6*  --  10.1 9.4 11.3* 10.7*  NEUTROABS  --    < > 8.0* 7.6 9.3* 8.7*  HGB 7.8*  --  8.7* 8.5* 8.7* 9.2*  HCT 23.3*  --  26.4* 25.9* 26.8* 28.4*  MCV 87.9  --  90.1 90.9 92.7 93.1  PLT 209  --  222 197 203 227   < > = values in this interval not displayed.    Medications: . sodium chloride    . sodium chloride     . (feeding supplement) PROSource Plus  30 mL Oral BID BM  . amiodarone  200 mg Oral Daily  . apixaban  2.5 mg  Oral BID  . cephALEXin  250 mg Oral Daily  . [START ON 11/05/2020] darbepoetin (ARANESP) injection - DIALYSIS  100 mcg Intravenous Q Thu-HD  . famotidine  20 mg Oral BID  . latanoprost  1 drop Both Eyes QHS  . ondansetron  4 mg Oral Once  . senna-docusate  2 tablet Oral QHS  . timolol  1 drop Both Eyes Daily    Dialysis Orders: [facility] TTS  4h 400/500 71kg 2/2 bathP2 Hep none RIJ TDC (AVF works butinfiltrated and not using) -Mircera 75 q 2 wks (last 5/5) No VDRA   Assessment/Plan: 1. LUE compartment syndrome: evacuation of hematoma/ repair of bleeding brachial artery pseudoaneurysm on 5/10 per Dr. Cain/VVS. Pain control per PMD.  2. ESRD - HD TTS. HD today per regular schedule.  3. Hypertension/volume - BP variable but mostly well controlled. No BP meds on home list.Increased volume on exam, per weights 2-3kg over dry, had been lowering as OP as patient had been losing weight. Continue to titrate down as tolerated.   4. Anemia of CKD/ABLA- Hgb  6.5 at lowest here post-op. s/p 2 unit prbcs 5/11. Recent ESA dose as outpatient but will be due again on Thursday 5/19, will order.  Hgb ^9.2 today.  5. Metabolic bone disease -CCa 10, phos improved today. No binders or VDRA.  Encourage nutrition.   6. Chronic combined CHF -- volume removal with UF on HD  7. PAFib- On Eliquis/amiodarone 8. Nutrition -Renal diet/fluid restriction.  Encourage nutrition. Prot supp for low albumin 9. Debility - going to [place], PA-C [place] 11/03/2020,11:39 AM  LOS: 7 days

## 2020-11-03 NOTE — H&P (Signed)
Physical Medicine and Rehabilitation Admission H&P    Chief Complaint  Patient presents with  . Functional deficits due to ICU myopathy/B/L foot drop    HPI: [name] is a 75 year old female with history of ESRD- HD TTS, HTN, PAF, glaucoma, OA,  Aortic valve stenosis with DOE, prior CIR stay 2013 for debility post septic shock/encephalopathy, left hip fracture 12/21; who was admitted via ED on 10/26/20 with LUE pain, swelling and numbness with reports of  having infiltrated her AVF 05/03 but able complete HD on 05/07 after brief rest.  She was found to have large intramuscular biceps hematoma with concerns of compartment syndrome and was taken to OR on 10/27/20 for evacuation of hematoma and primary repair of left brachial artery pseudoaneurysm by Dr. Donzetta Matters. Post procedure, numbness almost resolved with +thrill noted in fistula. Acute on chronic anemia treated with two total units of PRBC and weekly aranesp added today.  HD ongoing and she continues to be limited by fatigue, nausea and acute on chronic diarrhea. Therapy ongoing and patient noted to have deficits due to LUE pain as well as  BLE weakness with instability. CIR recommended due to functional decline.   Pt reports has been in and out of hospital since L hip fx in January, where it took 1 month to treat her C Diff and wetn out worse than she "went in".  Also had 16 admissions to hospital for UTIs 1 year, so is on Keflex for prophylaxis.  Also pain is OK controlled, but bothering her before meds can be given again.  LUE numb/tingling and PAINFUL.  Felt like would explode before surgery, now a "dull roar".  Hasn't been able to control bowel and bladder for 3+ years- goes, but always has diarrhea and accidents/incontinent bowel AND bladder- voids at least 2-3x/day minimum.   LBM this AM; .    Review of Systems  Constitutional: Negative for chills and fever.  HENT: Negative for hearing loss and tinnitus.   Eyes: Negative for  blurred vision and double vision.  Respiratory: Negative for cough and shortness of breath.   Cardiovascular: Positive for leg swelling. Negative for chest pain and palpitations.  Gastrointestinal: Positive for diarrhea (twice a week on average). Negative for heartburn and nausea.       Urgency with B/B incontinence   Genitourinary: Negative for dysuria.  Musculoskeletal: Positive for joint pain.  Skin: Negative for itching and rash.  Neurological: Positive for sensory change (left hand numb/cold) and weakness. Negative for dizziness and headaches.  Psychiatric/Behavioral: The patient has insomnia (at times due to arm throbbing).   All other systems reviewed and are negative.    Past Medical History:  Diagnosis Date  . Arthritis of left knee   . Cardiomyopathy ([place])    a. h/o LV dysfunction EF 20-25% in 2013 due to sepsis.>> improved to normal   . Chronic diastolic CHF (congestive heart failure) ([place])    10/ 2013 in setting of septic shock  . Complication of anesthesia    use a little anesthesia , per patient MD states she quit breathing (2016); hard to wake up  . ESRD (end stage renal disease) ([place])    dialysis Tues, Thurs, Sat [address], sees dr deterding   . GERD (gastroesophageal reflux disease)   . Glaucoma    both eyes  . H/O hiatal hernia    a. CT 2017: large gastric hiatal hernia.  Marland Kitchen Headache(784.0)    migraine hx of  .  History of blood transfusion 04/13/2015   . History of echocardiogram    a. Echo 6/17: EF 60-65%, normal wall motion, mild MR, atrial septal lipomatous hypertrophy, PASP 34 mmHg, possible trivial free-flowing pericardial effusion along RV free wall // b. Echo 5/17: Mild LVH, EF 55-60%, normal wall motion, grade 1 diastolic dysfunction, trivial MR, severe LAE, mild RAE, PASP 42 mmHg  . History of kidney stones    10/18/2019: per patient "has a couple currently one in each kidney"  . History of nephrostomy 04/11/2015   currently inplace 04/28/2015  removed  now  . History of nuclear stress test    a. Myoview 1/14 - Marked ischemia in the basal anterior, mid anterior, apical septal and apical inferior regions, EF 63% >> LHC normal   . Hyperlipidemia   . Hypertension    medication removed from regimen due to low blood pressure   . Iron deficiency    hx  . Myocardial infarction [facility]) 2013   10/18/2019: per patient "in 2013)  . Nephrolithiasis 2002, 2006   bilateral  . Normal coronary arteries 2014   a. LHC in 1/14: normal coronary arteries  . PAF (paroxysmal atrial fibrillation) ([place])    a. 10/ 2013  in setting of Septic Shock //  b. recurrent during admit for pneumonia, L effusion >> placed on Amiodarone // Coumadin for anticoagulation  . Pneumonia jan 2018, last tme lungs clear now   dx 10-06-2014 per CXR--  on 10-27-2014 pt states finished antibiotic and denies cough or fever  . Primary localized osteoarthritis of right hip 10/22/2019  . S/P hemodialysis catheter insertion ([place]) 04/11/2015    right anterior chest , only used once   . Sigmoid diverticulosis   . SOB (shortness of breath) on exertion    10/18/2019: per patient "get short of breath with activity sometimes due to heart valve"  . UTI (urinary tract infection) 05/10/2016    Past Surgical History:  Procedure Laterality Date  . AV FISTULA PLACEMENT Left 06/02/2015   Procedure: BRACHIOCEPHALIC ARTERIOVENOUS (AV) FISTULA CREATION ;  Surgeon: Conrad Houtzdale, MD;  Location: [place];  Service: Vascular;  Laterality: Left;  . BASCILIC VEIN TRANSPOSITION Left 07/27/2015   Procedure: FIRST STAGE BASILIC VEIN TRANSPOSITION LEFT UPPER ARM;  Surgeon: Conrad Covington, MD;  Location: [place];  Service: Vascular;  Laterality: Left;  . BASCILIC VEIN TRANSPOSITION Left 09/2015   second phase  . BASCILIC VEIN TRANSPOSITION Left 10/12/2015   Procedure: SECOND STAGE BASILIC VEIN TRANSPOSITION LEFT ARM;  Surgeon: Conrad Ajo, MD;  Location: [place];  Service: Vascular;  Laterality: Left;  . BREAST BIOPSY Left  08/23/07   benign fibrocystic with duct ectasia  . CARDIAC CATHETERIZATION  07-11-2012  dr Irish Lack   Abnormal stress test/   normal coronary arteries/  LVEDP  62mmHg  . CARDIOVASCULAR STRESS TEST  06-26-2012  dr Irish Lack   marked ischemia in the basal anterior, mid anterior, apical inferior regions/  normal LVF, ef 63%  . CATARACT EXTRACTION W/ INTRAOCULAR LENS  IMPLANT, BILATERAL    . COLONOSCOPY WITH PROPOFOL N/A 10/17/2016   Procedure: COLONOSCOPY WITH PROPOFOL;  Surgeon: Garlan Fair, MD;  [facility]: [facility];  Service: Endoscopy;  Laterality: N/A;  . CYSTOSCOPY W/ URETERAL STENT PLACEMENT  04/04/2012   Procedure: CYSTOSCOPY WITH RETROGRADE PYELOGRAM/URETERAL STENT PLACEMENT;  Surgeon: Ailene Rud, MD;  Location: [place];  Service: Urology;  Laterality: Left;  . CYSTOSCOPY W/ URETERAL STENT PLACEMENT Bilateral 05/04/2015   Procedure: CYSTOSCOPY  WITH BILATERAL RETROGRADE PYELOGRAM/ WITH INTERPRETATION, EXCHANGE OF RIGHT URETERAL STENT REPLACEMENT AND PLACEMENT LEFT URETERAL STENT PLACEMENT EXAMINATION OF VAGINA;  Surgeon: Carolan Clines, MD;  Location: [facility];  Service: Urology;  Laterality: Bilateral;  . CYSTOSCOPY WITH STENT PLACEMENT Right 10/28/2014   Procedure: RIGHT URETERAL STENT PLACEMENT;  Surgeon: Irine Seal, MD;  Location: [facility];  Service: Urology;  Laterality: Right;  . CYSTOSCOPY WITH STENT PLACEMENT Right 02/26/2015   Procedure: CYSTOSCOPY RETROGRADE PYELOGRAM WITH STENT PLACEMENT;  Surgeon: Cleon Gustin, MD;  Location: [facility];  Service: Urology;  Laterality: Right;  . CYSTOSCOPY/RETROGRADE/URETEROSCOPY/STONE EXTRACTION WITH BASKET Right 11/21/2014   Procedure: CYSTOSCOPY/RIGHT RETROGRADE PYELOGRAM/RIGHT URETEROSCOPY/BASKET EXTRACTION/RIGHT PYELOSCOPY/LASER OF STONE/RIGHT DOUBLE J STENT;  Surgeon: Carolan Clines, MD;  Location: [place];  Service: Urology;  Laterality: Right;  . ESOPHAGOGASTRODUODENOSCOPY (EGD) WITH  PROPOFOL Left 08/25/2018   Procedure: ESOPHAGOGASTRODUODENOSCOPY (EGD) WITH PROPOFOL;  Surgeon: Ronald Lobo, MD;  Location: [place];  Service: Endoscopy;  Laterality: Left;  . EXTRACORPOREAL SHOCK WAVE LITHOTRIPSY  05-28-2012  &  10-08-2012  . HEMATOMA EVACUATION Left 10/27/2020   Procedure: EVACUATION HEMATOMA, repair of Pseudoaneurysm;  Surgeon: Waynetta Sandy, MD;  Location: [place];  Service: Vascular;  Laterality: Left;  . HIP ARTHROPLASTY Left 06/09/2020   Procedure: ARTHROPLASTY BIPOLAR HIP (HEMIARTHROPLASTY);  Surgeon: Marchia Bond, MD;  Location: [place];  Service: Orthopedics;  Laterality: Left;  . HOLMIUM LASER APPLICATION Right 1/0/2585   Procedure: HOLMIUM LASER APPLICATION;  Surgeon: Carolan Clines, MD;  Location: [facility];  Service: Urology;  Laterality: Right;  . IR GENERIC HISTORICAL  02/01/2016   IR NEPHROSTOMY EXCHANGE RIGHT 02/01/2016 Greggory Keen, MD [facility]  . IR GENERIC HISTORICAL  02/24/2016   IR PATIENT EVAL TECH 0-60 MINS 02/24/2016 Aletta Edouard, MD [facility]  . KNEE ARTHROSCOPY Left 02-14-2003  . LAPAROSCOPIC CHOLECYSTECTOMY  03-23-2005  . TOTAL ABDOMINAL HYSTERECTOMY W/ BILATERAL SALPINGOOPHORECTOMY  1993   secondary to fibroids  . TOTAL KNEE ARTHROPLASTY Left 10/22/2019  . TOTAL KNEE ARTHROPLASTY Left 10/22/2019   Procedure: TOTAL KNEE ARTHROPLASTY;  Surgeon: Marchia Bond, MD;  Location: [place];  Service: Orthopedics;  Laterality: Left;  . TRANSTHORACIC ECHOCARDIOGRAM  04-09-2012   normal LVF,  ef 60-65%,  mild LAE,  mild TR, trivial MR and PR    Family History  Problem Relation Age of Onset  . Hypertension Mother   . Cancer Mother 47       breast  . Dementia Mother   . Hypertension Brother   . Diabetes Brother   . Heart disease Brother        before age 41  . Cancer Father 32       pancreatic  . Heart failure Paternal Grandmother   . Bladder Cancer Maternal Grandfather     Social History:  Lives with  daughter. Has been limited to wheelchair/walker since December due to hip fracture and was still getting home therapy. She reports that she has never smoked. She has never used smokeless tobacco. She reports that she does not drink alcohol and does not use drugs.   Allergies  Allergen Reactions  . Astemizole Nausea And Vomiting  . Fluorouracil Rash  . Vicodin [Hydrocodone-Acetaminophen] Nausea And Vomiting  . Chlorhexidine Rash    Sunburn    rash  . Percocet [Oxycodone-Acetaminophen] Nausea And Vomiting    Medications Prior to Admission  Medication Sig Dispense Refill  . acetaminophen (TYLENOL) 325 MG tablet Take 2 tablets (650 mg total)  by mouth every 6 (six) hours as needed for mild pain, fever or headache.    Marland Kitchen amiodarone (PACERONE) 200 MG tablet Take 1 tablet (200 mg total) by mouth daily. 90 tablet 3  . apixaban (ELIQUIS) 2.5 MG TABS tablet Take 1 tablet (2.5 mg total) by mouth 2 (two) times daily. 60 tablet 5  . bimatoprost (LUMIGAN) 0.01 % SOLN Place 1 drop into both eyes at bedtime.    . cephALEXin (KEFLEX) 250 MG capsule Take 250 mg by mouth daily.    Marland Kitchen lidocaine-prilocaine (EMLA) cream Apply 1 application topically 3 (three) times a week.    . loperamide (IMODIUM A-D) 2 MG tablet Take 2 mg by mouth 4 (four) times daily as needed for diarrhea or loose stools.    Marland Kitchen LORazepam (ATIVAN) 0.5 MG tablet Take 0.5 mg by mouth daily as needed for anxiety (or panic attack).    . multivitamin (RENA-VIT) TABS tablet Take 1 tablet by mouth at bedtime.    . Nutritional Supplements (FEEDING SUPPLEMENT, NEPRO CARB STEADY,) LIQD Take 237 mLs by mouth 2 (two) times daily between meals. (Patient taking differently: Take 237 mLs by mouth daily as needed (supplement).)  0  . ondansetron (ZOFRAN) 4 MG tablet Take 4 mg by mouth every 8 (eight) hours as needed for nausea or vomiting.    . saccharomyces boulardii (FLORASTOR) 250 MG capsule Take 1 capsule (250 mg total) by mouth 2 (two) times daily. 60  capsule 0  . timolol (BETIMOL) 0.5 % ophthalmic solution Place 1 drop into both eyes daily.    . calcium acetate (PHOSLO) 667 MG capsule Take 1 capsule (667 mg total) by mouth 3 (three) times daily with meals. (Patient not taking: No sig reported)      Drug Regimen Review  Drug regimen was reviewed and remains appropriate with no significant issues identified  Home: Home Living Family/patient expects to be discharged to:: Inpatient rehab Living Arrangements: Children (daughter) Available Help at Discharge: Family,Available 24 hours/day Type of Home: House Home Access: Stairs to enter [employer] of Steps: 2 Entrance Stairs-Rails: None Home Layout: One level Bathroom Shower/Tub: Chiropodist: Handicapped height Bathroom Accessibility: Yes Home Equipment: Grab bars - tub/shower,Walker - 4 wheels,Cane - quad,Cane - single point,Wheelchair - manual,Walker - 2 wheels  Lives With: Daughter   Functional History: Prior Function Level of Independence: Needs assistance Gait / Transfers Assistance Needed: Prior to fall in December and broke her hip, she was independent. Since then she has been utilizing her manual w/c and RW, walking up to ~30 ft with min guard. Pt independent with w/c. Pt needing assistance to power up to stand and occasionally to perform bed mobility. Daughter pushes pt in w/c up and down the stairs. ADL's / Homemaking Assistance Needed: Daughter assists with bathing and donning/doffing socks/shoes. Daughter provides transportation, cleans, cooks, and assists with financial and medication management. Comments: Has not driven in year due to knee surgery  Functional Status:  Mobility: Bed Mobility Overal bed mobility: Needs Assistance Bed Mobility: Supine to Sit,Sit to Supine Rolling: Min assist Supine to sit: Mod assist,+2 for physical assistance Sit to supine: +2 for physical assistance,Max assist General bed mobility comments: Pt able to  assist bring BLE's off edge of bed slowly, HHA to pull up with R hand to sit up. Assist for trunk guidance and BLE elevation back into bed Transfers Overall transfer level: Needs assistance Equipment used: Rolling walker (2 wheeled) Transfers: Sit to/from Stand Sit to Stand: Mod assist,+2  physical assistance,+2 safety/equipment  Lateral/Scoot Transfers: Mod assist General transfer comment: On first two attempts, pt only able to achieve minimal hip clearance. Successful to stand on last two trials, pushing both hands off edge of bed, bilateral feet blocked to prevent anterior slide. Tactile/verbal cues for upright posture, decreased left lateral lean, upward gaze. Able to take side steps with significant time/effort, cues for weight shifting. Significant trunk flexion. Pt reporting "my legs are giving out." Ambulation/Gait Ambulation/Gait assistance: Min assist,+2 physical assistance,+2 safety/equipment Gait Distance (Feet): 7 Feet Assistive device: Rolling walker (2 wheeled) Gait Pattern/deviations: Step-through pattern,Decreased step length - right,Decreased step length - left,Decreased stride length,Decreased weight shift to right,Decreased weight shift to left,Trunk flexed General Gait Details: Ambulated anteriorly with minAx2 to steady, bring chair behind pt, and manage lines. Cues to shift weight and advance legs, with increased time and noted trunk sway. No appreciative knee buckling noted even though pt reported feeling like her legs were going to "give out". Gait velocity: reduced Gait velocity interpretation: <1.31 ft/sec, indicative of household ambulator    ADL: ADL Overall ADL's : Needs assistance/impaired Eating/Feeding: Set up,Sitting Eating/Feeding Details (indicate cue type and reason): encouraged pt to use LUE to reach for items on tray and use L hand as stabilize while R hand completes precision task of opening items Grooming: Minimal assistance,Sitting Grooming Details  (indicate cue type and reason): in recliner Upper Body Bathing: Moderate assistance,Sitting Upper Body Bathing Details (indicate cue type and reason): in recliner Lower Body Bathing: Maximal assistance Lower Body Bathing Details (indicate cue type and reason): Mod A +2 sit<>stand from recliner (using gait belt and chair pad) Upper Body Dressing : Maximal assistance,Sitting Upper Body Dressing Details (indicate cue type and reason): in recliner Lower Body Dressing: Total assistance Lower Body Dressing Details (indicate cue type and reason): Mod A +2 sit<>stand from recliner (using gait belt and chair pad) Toilet Transfer Details (indicate cue type and reason): min A +2 sit<>stand from raised bed>min A +2 ambulation RE 8 feet. Mod A +2 sit<>stand from recliner Toileting- Clothing Manipulation and Hygiene: Total assistance Toileting - Clothing Manipulation Details (indicate cue type and reason): Mod A +2 sit<>stand from recliner (using gait belt and chair pad) General ADL Comments: session focused on LUE HEP. pt continues to be mostly limited by increased pain in LUE and decreased activity tolerance  Cognition: Cognition Overall Cognitive Status: Within Functional Limits for tasks assessed Orientation Level: Oriented X4 Cognition Arousal/Alertness: Awake/alert Behavior During Therapy: Flat affect Overall Cognitive Status: Within Functional Limits for tasks assessed General Comments: A&Ox4.   Blood pressure (!) 99/52, pulse 69, temperature 97.9 F (36.6 C), temperature source Oral, resp. rate 17, height 5\' 3"  (1.6 m), weight 75.5 kg, last menstrual period 01/19/1992, SpO2 98 %. Physical Exam Vitals and nursing note reviewed. Exam conducted with a chaperone present.  Constitutional:      Appearance: Normal appearance.     Comments: Pt laying in bed- appears frail; LUE up on pillow; daughter at bedside, NAD  HENT:     Head: Normocephalic and atraumatic.     Right Ear: External ear normal.      Left Ear: External ear normal.     Nose: Nose normal. No congestion.     Mouth/Throat:     Mouth: Mucous membranes are dry.     Pharynx: Oropharynx is clear. No oropharyngeal exudate.  Eyes:     General:        Right eye: No discharge.  Left eye: No discharge.     Extraocular Movements: Extraocular movements intact.  Cardiovascular:     Comments: Sounds RRR- Significant holosystolic murmur 4/6- can hear throughout chest.  No JVD; HD catheter in R chest with bruising Pulmonary:     Comments: CTA B/L- no W/R/R- good air movement Abdominal:     Comments: Soft, NT, ND, (+)BS   Musculoskeletal:     Cervical back: Normal range of motion and neck supple.     Comments: LUE with edema and resolving ecchymosis. Right chest wall with IJ in place and surrounding ecchymosis.   RUE- biceps/triceps 3+/5, WE/Grip and FA 4+/5 LUE- 3-/5 in same muscles LEs- HF 2/5, KE 3+/5, and KF 3-/5, DF 3-/5 and PF 3-/5 Very swollen LUE- not tight  Skin:    General: Skin is warm and dry.     Comments: A lot of brownish/purple bruising- R hand and entire LUE Slightly boggy heels B/L   Neurological:     Mental Status: She is alert.     Comments: Ox3 Decreased sensation in LUE from mid upper arm down to fingers Otherwise, intact in RUE and LE's   Psychiatric:     Comments: Tired, but appropriate, slightly flat affect     Results for orders placed or performed during the hospital encounter of 10/26/20 (from the past 48 hour(s))  CBC with Differential/Platelet     Status: Abnormal   Collection Time: 11/02/20  2:00 AM  Result Value Ref Range   WBC 11.3 (H) 4.0 - 10.5 K/uL   RBC 2.89 (L) 3.87 - 5.11 MIL/uL   Hemoglobin 8.7 (L) 12.0 - 15.0 g/dL   HCT 26.8 (L) 36.0 - 46.0 %   MCV 92.7 80.0 - 100.0 fL   MCH 30.1 26.0 - 34.0 pg   MCHC 32.5 30.0 - 36.0 g/dL   RDW 23.5 (H) 11.5 - 15.5 %   Platelets 203 150 - 400 K/uL   nRBC 0.0 0.0 - 0.2 %   Neutrophils Relative % 82 %   Neutro Abs 9.3 (H) 1.7  - 7.7 K/uL   Lymphocytes Relative 7 %   Lymphs Abs 0.8 0.7 - 4.0 K/uL   Monocytes Relative 8 %   Monocytes Absolute 0.9 0.1 - 1.0 K/uL   Eosinophils Relative 3 %   Eosinophils Absolute 0.3 0.0 - 0.5 K/uL   Basophils Relative 0 %   Basophils Absolute 0.1 0.0 - 0.1 K/uL   Immature Granulocytes 0 %   Abs Immature Granulocytes 0.05 0.00 - 0.07 K/uL    Comment: Performed at [facility], [address] [place], [place] [address]  CBC with Differential/Platelet     Status: Abnormal   Collection Time: 11/03/20  1:10 AM  Result Value Ref Range   WBC 10.7 (H) 4.0 - 10.5 K/uL   RBC 3.05 (L) 3.87 - 5.11 MIL/uL   Hemoglobin 9.2 (L) 12.0 - 15.0 g/dL   HCT 28.4 (L) 36.0 - 46.0 %   MCV 93.1 80.0 - 100.0 fL   MCH 30.2 26.0 - 34.0 pg   MCHC 32.4 30.0 - 36.0 g/dL   RDW 23.5 (H) 11.5 - 15.5 %   Platelets 227 150 - 400 K/uL   nRBC 0.0 0.0 - 0.2 %   Neutrophils Relative % 81 %   Neutro Abs 8.7 (H) 1.7 - 7.7 K/uL   Lymphocytes Relative 6 %   Lymphs Abs 0.7 0.7 - 4.0 K/uL   Monocytes Relative 8 %   Monocytes  Absolute 0.8 0.1 - 1.0 K/uL   Eosinophils Relative 3 %   Eosinophils Absolute 0.3 0.0 - 0.5 K/uL   Basophils Relative 1 %   Basophils Absolute 0.1 0.0 - 0.1 K/uL   Immature Granulocytes 1 %   Abs Immature Granulocytes 0.14 (H) 0.00 - 0.07 K/uL    Comment: Performed at [facility] [address] [place], [place] [address]  Renal function panel     Status: Abnormal   Collection Time: 11/03/20  8:43 AM  Result Value Ref Range   Sodium 133 (L) 135 - 145 mmol/L   Potassium 3.7 3.5 - 5.1 mmol/L   Chloride 98 98 - 111 mmol/L   CO2 27 22 - 32 mmol/L   Glucose, Bld 105 (H) 70 - 99 mg/dL    Comment: Glucose reference range applies only to samples taken after fasting for at least 8 hours.   BUN 35 (H) 8 - 23 mg/dL   Creatinine, Ser 5.14 (H) 0.44 - 1.00 mg/dL   Calcium 8.2 (L) 8.9 - 10.3 mg/dL   Phosphorus 3.0 2.5 - 4.6 mg/dL   Albumin 1.5 (L) 3.5 - 5.0 g/dL   GFR, Estimated 8  (L) >60 mL/min    Comment: (NOTE) Calculated using the CKD-EPI Creatinine Equation (2021)    Anion gap 8 5 - 15    Comment: Performed at [facility] [address], [place], [place] [address]   No results found.     Medical Problem List and Plan: 1.  ICU myopathy with B/L foot drop/LE>UE weakness secondary to prolonged hospitalization/recent hospitalization for LUE thrombus s/p thrombectomy  -patient may not shower due to IJ catheter  -ELOS/Goals: ~3-4 weeks- min Assist hopefully, due to severe generalized weakness,  2.  Antithrombotics: -DVT/anticoagulation:  Pharmaceutical: Other (comment)--renal dose Eliquis.   -antiplatelet therapy: N/A 3. Pain Management: Oxycodone prn for severe pain. Tramadol added prn for moderate pain.   --will add low dose gabapentin for neuropathic pain and increase oxy to q4 hours for pain since cannot do a lot of nerve pain meds due to ESRD  4. Mood: LCSW to follow for evaluation and support.   -antipsychotic agents: N/A 5. Neuropsych: This patient is capable of making decisions on his own behalf. 6. Skin/Wound Care:  Routine pressure relief measures.  7. Fluids/Electrolytes/Nutrition: Strict I/O with daily weights. Renal diet/1200 cc FR.  8. Hematoma LUE s/p evacuation: continue to monitor pulse and for symptoms. Also elevate LUE 9. Acute on chronic anemia: Improved post transfusion. Continue aransep weekly.  10. PAF: Monitor HR tid--continue amiodarone and Eliquis.  11. ESRD: HD on TTS after therapy to help with tolerance of therapy.  12. Chronic UTI: On low dose Keflex for suppression.  13. B/L foot drop- will order prevalon boots for moderate B/L foot drop   Bary Leriche, PA-C 11/03/2020    I have personally performed a face to face diagnostic evaluation of this patient and formulated the key components of the plan.  Additionally, I have personally reviewed laboratory data, imaging studies, as well as relevant notes and concur with the  physician assistant's documentation above.   The patient's status has not changed from the original H&P.  Any changes in documentation from the acute care chart have been noted above.

## 2020-11-03 NOTE — Progress Notes (Signed)
Inpatient Rehabilitation Admissions Coordinator   CIR bed is available today. I met with patient in hemodialysis and she is aware and in agreement. I have contacted Freda Munro, in hemodialysis, daughter, [name] by phone, acute team and TOC. I will make the arrangements to admit today.  Danne Baxter, RN, MSN Rehab Admissions Coordinator [phone] 11/03/2020 10:05 AM

## 2020-11-03 NOTE — Progress Notes (Signed)
Inpatient Rehabilitation  Patient information reviewed and entered into eRehab system by Rockey Guarino M. Greogory Cornette, M.A., CCC/SLP, PPS Coordinator.  Information including medical coding, functional ability and quality indicators will be reviewed and updated through discharge.    

## 2020-11-03 NOTE — Progress Notes (Signed)
Orthopedic Tech Progress Note Patient Details:  [name] December 02, 1945 [id number] Called floor to let secretary know to have RN order PREVALON BOOTS from materials.  Patient ID: [name], female   DOB: 1946/04/23, 75 y.o.   MRN: [id number]   Janit Pagan 11/03/2020, 7:02 PM

## 2020-11-04 DIAGNOSIS — G8918 Other acute postprocedural pain: Secondary | ICD-10-CM

## 2020-11-04 DIAGNOSIS — N186 End stage renal disease: Secondary | ICD-10-CM

## 2020-11-04 DIAGNOSIS — I480 Paroxysmal atrial fibrillation: Secondary | ICD-10-CM | POA: Diagnosis not present

## 2020-11-04 DIAGNOSIS — D72829 Elevated white blood cell count, unspecified: Secondary | ICD-10-CM | POA: Diagnosis not present

## 2020-11-04 DIAGNOSIS — D649 Anemia, unspecified: Secondary | ICD-10-CM

## 2020-11-04 DIAGNOSIS — G7281 Critical illness myopathy: Secondary | ICD-10-CM | POA: Diagnosis not present

## 2020-11-04 DIAGNOSIS — Z992 Dependence on renal dialysis: Secondary | ICD-10-CM

## 2020-11-04 MED ORDER — DARBEPOETIN ALFA 100 MCG/0.5ML IJ SOSY
100.0000 ug | PREFILLED_SYRINGE | INTRAMUSCULAR | Status: DC
  Filled 2020-11-04 (×3): qty 0.5

## 2020-11-04 MED ORDER — RENA-VITE PO TABS
1.0000 | ORAL_TABLET | Freq: Every day | ORAL | Status: DC
  Administered 2020-11-04 – 2020-12-01 (×28): 1 via ORAL
  Filled 2020-11-04 (×28): qty 1

## 2020-11-04 MED ORDER — CHLORHEXIDINE GLUCONATE CLOTH 2 % EX PADS
6.0000 | MEDICATED_PAD | Freq: Every day | CUTANEOUS | Status: DC
  Administered 2020-11-19 – 2020-11-25 (×6): 6 via TOPICAL

## 2020-11-04 NOTE — Evaluation (Signed)
Occupational Therapy Assessment and Plan  Patient Details  Name: [name] MRN: [id number] Date of Birth: [birth year]  OT Diagnosis: abnormal posture, acute pain and muscle weakness (generalized) Rehab Potential: Rehab Potential (ACUTE ONLY): Good ELOS: 17-20 days   Today's Date: 11/04/2020 OT Individual Time: 2197-5883 OT Individual Time Calculation (min): 61 min     Hospital Problem: Principal Problem:   Intensive care (ICU) myopathy Active Problems:   Debility   ESRD on dialysis ([place])   Acute on chronic anemia   Post-operative pain   Past Medical History:  Past Medical History:  Diagnosis Date  . Arthritis of left knee   . Cardiomyopathy ([place])    a. h/o LV dysfunction EF 20-25% in 2013 due to sepsis.>> improved to normal   . Chronic diastolic CHF (congestive heart failure) ([place])    10/ 2013 in setting of septic shock  . Complication of anesthesia    use a little anesthesia , per patient MD states she quit breathing (2016); hard to wake up  . ESRD (end stage renal disease) ([place])    dialysis Tues, Thurs, Sat [address], sees dr deterding   . GERD (gastroesophageal reflux disease)   . Glaucoma    both eyes  . H/O hiatal hernia    a. CT 2017: large gastric hiatal hernia.  Marland Kitchen Headache(784.0)    migraine hx of  . History of blood transfusion 04/13/2015   . History of echocardiogram    a. Echo 6/17: EF 60-65%, normal wall motion, mild MR, atrial septal lipomatous hypertrophy, PASP 34 mmHg, possible trivial free-flowing pericardial effusion along RV free wall // b. Echo 5/17: Mild LVH, EF 55-60%, normal wall motion, grade 1 diastolic dysfunction, trivial MR, severe LAE, mild RAE, PASP 42 mmHg  . History of kidney stones    10/18/2019: per patient "has a couple currently one in each kidney"  . History of nephrostomy 04/11/2015   currently inplace 04/28/2015  removed now  . History of nuclear stress test    a. Myoview 1/14 - Marked ischemia in the basal anterior, mid  anterior, apical septal and apical inferior regions, EF 63% >> LHC normal   . Hyperlipidemia   . Hypertension    medication removed from regimen due to low blood pressure   . Iron deficiency    hx  . Myocardial infarction [facility]) 2013   10/18/2019: per patient "in 2013)  . Nephrolithiasis 2002, 2006   bilateral  . Normal coronary arteries 2014   a. LHC in 1/14: normal coronary arteries  . PAF (paroxysmal atrial fibrillation) ([place])    a. 10/ 2013  in setting of Septic Shock //  b. recurrent during admit for pneumonia, L effusion >> placed on Amiodarone // Coumadin for anticoagulation  . Pneumonia jan 2018, last tme lungs clear now   dx 10-06-2014 per CXR--  on 10-27-2014 pt states finished antibiotic and denies cough or fever  . Primary localized osteoarthritis of right hip 10/22/2019  . S/P hemodialysis catheter insertion ([place]) 04/11/2015    right anterior chest , only used once   . Sigmoid diverticulosis   . SOB (shortness of breath) on exertion    10/18/2019: per patient "get short of breath with activity sometimes due to heart valve"  . UTI (urinary tract infection) 05/10/2016   Past Surgical History:  Past Surgical History:  Procedure Laterality Date  . AV FISTULA PLACEMENT Left 06/02/2015   Procedure: BRACHIOCEPHALIC ARTERIOVENOUS (AV) FISTULA CREATION ;  Surgeon: Conrad Russellton, MD;  Location: [facility];  Service: Vascular;  Laterality: Left;  . BASCILIC VEIN TRANSPOSITION Left 07/27/2015   Procedure: FIRST STAGE BASILIC VEIN TRANSPOSITION LEFT UPPER ARM;  Surgeon: Conrad Lyle, MD;  Location: [place];  Service: Vascular;  Laterality: Left;  . BASCILIC VEIN TRANSPOSITION Left 09/2015   second phase  . BASCILIC VEIN TRANSPOSITION Left 10/12/2015   Procedure: SECOND STAGE BASILIC VEIN TRANSPOSITION LEFT ARM;  Surgeon: Conrad Mapleton, MD;  Location: [place];  Service: Vascular;  Laterality: Left;  . BREAST BIOPSY Left 08/23/07   benign fibrocystic with duct ectasia  . CARDIAC CATHETERIZATION   07-11-2012  dr Irish Lack   Abnormal stress test/   normal coronary arteries/  LVEDP  67mmHg  . CARDIOVASCULAR STRESS TEST  06-26-2012  dr Irish Lack   marked ischemia in the basal anterior, mid anterior, apical inferior regions/  normal LVF, ef 63%  . CATARACT EXTRACTION W/ INTRAOCULAR LENS  IMPLANT, BILATERAL    . COLONOSCOPY WITH PROPOFOL N/A 10/17/2016   Procedure: COLONOSCOPY WITH PROPOFOL;  Surgeon: Garlan Fair, MD;  Location: [facility];  Service: Endoscopy;  Laterality: N/A;  . CYSTOSCOPY W/ URETERAL STENT PLACEMENT  04/04/2012   Procedure: CYSTOSCOPY WITH RETROGRADE PYELOGRAM/URETERAL STENT PLACEMENT;  Surgeon: Ailene Rud, MD;  Location: [place];  Service: Urology;  Laterality: Left;  . CYSTOSCOPY W/ URETERAL STENT PLACEMENT Bilateral 05/04/2015   Procedure: CYSTOSCOPY WITH BILATERAL RETROGRADE PYELOGRAM/ WITH INTERPRETATION, EXCHANGE OF RIGHT URETERAL STENT REPLACEMENT AND PLACEMENT LEFT URETERAL STENT PLACEMENT EXAMINATION OF VAGINA;  Surgeon: Carolan Clines, MD;  Location: [facility];  Service: Urology;  Laterality: Bilateral;  . CYSTOSCOPY WITH STENT PLACEMENT Right 10/28/2014   Procedure: RIGHT URETERAL STENT PLACEMENT;  Surgeon: Irine Seal, MD;  Location[facility];  Service: Urology;  Laterality: Right;  . CYSTOSCOPY WITH STENT PLACEMENT Right 02/26/2015   Procedure: CYSTOSCOPY RETROGRADE PYELOGRAM WITH STENT PLACEMENT;  Surgeon: Cleon Gustin, MD;  [facility]: [facility];  Service: Urology;  Laterality: Right;  . CYSTOSCOPY/RETROGRADE/URETEROSCOPY/STONE EXTRACTION WITH BASKET Right 11/21/2014   Procedure: CYSTOSCOPY/RIGHT RETROGRADE PYELOGRAM/RIGHT URETEROSCOPY/BASKET EXTRACTION/RIGHT PYELOSCOPY/LASER OF STONE/RIGHT DOUBLE J STENT;  Surgeon: Carolan Clines, MD;  Location: [place];  Service: Urology;  Laterality: Right;  . ESOPHAGOGASTRODUODENOSCOPY (EGD) WITH PROPOFOL Left 08/25/2018   Procedure: ESOPHAGOGASTRODUODENOSCOPY (EGD) WITH  PROPOFOL;  Surgeon: Ronald Lobo, MD;  Location: [place];  Service: Endoscopy;  Laterality: Left;  . EXTRACORPOREAL SHOCK WAVE LITHOTRIPSY  05-28-2012  &  10-08-2012  . HEMATOMA EVACUATION Left 10/27/2020   Procedure: EVACUATION HEMATOMA, repair of Pseudoaneurysm;  Surgeon: Waynetta Sandy, MD;  Location: [place];  Service: Vascular;  Laterality: Left;  . HIP ARTHROPLASTY Left 06/09/2020   Procedure: ARTHROPLASTY BIPOLAR HIP (HEMIARTHROPLASTY);  Surgeon: Marchia Bond, MD;  Location: [place];  Service: Orthopedics;  Laterality: Left;  . HOLMIUM LASER APPLICATION Right 02/24/2951   Procedure: HOLMIUM LASER APPLICATION;  Surgeon: Carolan Clines, MD;  Location[facility];  Service: Urology;  Laterality: Right;  . IR GENERIC HISTORICAL  02/01/2016   IR NEPHROSTOMY EXCHANGE RIGHT 02/01/2016 Greggory Keen, MD [facility]  . IR GENERIC HISTORICAL  02/24/2016   IR PATIENT EVAL TECH 0-60 MINS 02/24/2016 Aletta Edouard, MD [facility]  . KNEE ARTHROSCOPY Left 02-14-2003  . LAPAROSCOPIC CHOLECYSTECTOMY  03-23-2005  . TOTAL ABDOMINAL HYSTERECTOMY W/ BILATERAL SALPINGOOPHORECTOMY  1993   secondary to fibroids  . TOTAL KNEE ARTHROPLASTY Left 10/22/2019  . TOTAL KNEE ARTHROPLASTY Left 10/22/2019   Procedure: TOTAL KNEE ARTHROPLASTY;  Surgeon:  Marchia Bond, MD;  Location: [place];  Service: Orthopedics;  Laterality: Left;  . TRANSTHORACIC ECHOCARDIOGRAM  04-09-2012   normal LVF,  ef 60-65%,  mild LAE,  mild TR, trivial MR and PR    Assessment & Plan Clinical Impression: Patient is a 76 y.o. year old female with recent admission to the hospital on 10/26/20 with LUE pain, swelling and numbness with reports of having infiltrated her AVF 05/03 but able complete HD on 05/07 after brief rest. She was found to have large intramuscular biceps hematoma with concerns of compartment syndrome and was taken to OR on 10/27/20 for evacuation of hematoma and primary repair of left brachial  artery pseudoaneurysm by Dr. Donzetta Matters. Post procedure, numbness almost resolved with +thrill noted in fistula.   Patient transferred to CIR on 11/03/2020 .    Patient currently requires total with basic self-care skills secondary to muscle weakness, muscle joint tightness and muscle paralysis, impaired timing and sequencing, unbalanced muscle activation and decreased coordination and decreased sitting balance, decreased standing balance, decreased postural control and decreased balance strategies.  Prior to hospitalization, patient could complete ADLs with modified independent .  Patient will benefit from skilled intervention to decrease level of assist with basic self-care skills and increase independence with basic self-care skills prior to discharge home with care partner.  Anticipate patient will require minimal physical assistance and follow up home health.  OT - End of Session Activity Tolerance: Improving;Decreased this session Endurance Deficit: Yes Endurance Deficit Description: needs rest breaks during mobility evaluation, can only stand for about 30 seconds OT Assessment Rehab Potential (ACUTE ONLY): Good OT Patient demonstrates impairments in the following area(s): Balance;Endurance;Motor;Pain;Sensory OT Basic ADL's Functional Problem(s): Grooming;Bathing;Dressing;Toileting;Eating OT Transfers Functional Problem(s): Toilet;Tub/Shower OT Additional Impairment(s): Fuctional Use of Upper Extremity OT Plan OT Intensity: Minimum of 1-2 x/day, 45 to 90 minutes OT Frequency: 5 out of 7 days OT Duration/Estimated Length of Stay: 17-20 days OT Treatment/Interventions: Balance/vestibular training;Community reintegration;DME/adaptive equipment instruction;Discharge planning;Pain management;Self Care/advanced ADL retraining;Patient/family education;Functional mobility training;Neuromuscular re-education;Splinting/orthotics;UE/LE Strength taining/ROM;Therapeutic Exercise;Therapeutic Activities;UE/LE  Coordination activities;Wheelchair propulsion/positioning OT Self Feeding Anticipated Outcome(s): independent OT Basic Self-Care Anticipated Outcome(s): min assist OT Toileting Anticipated Outcome(s): min assist OT Bathroom Transfers Anticipated Outcome(s): min assist OT Recommendation Patient destination: Home Follow Up Recommendations: Home health OT;24 hour supervision/assistance Equipment Recommended: To be determined   OT Evaluation Precautions/Restrictions  Precautions Precautions: Fall Precaution Comments: L UE fistula with upper arm wound and pain Restrictions Weight Bearing Restrictions: No  Pain Pain Assessment Pain Scale: Faces Pain Score: 6  Faces Pain Scale: Hurts little more Pain Type: Surgical pain Pain Location: Hip Pain Orientation: Left Pain Descriptors / Indicators: Discomfort Pain Onset: With Activity Pain Intervention(s): Repositioned;Emotional support Home Living/Prior Functioning Home Living Family/patient expects to be discharged to:: Private residence Living Arrangements: Children Available Help at Discharge: Family,Available 24 hours/day Type of Home: House Home Access: Stairs to enter [employer] of Steps: 2 Entrance Stairs-Rails: None Home Layout: One level Bathroom Shower/Tub: Chiropodist: Handicapped height Bathroom Accessibility: No (has to step sideways with the walker)  Lives With: Daughter IADL History Homemaking Responsibilities: No Current License: Yes (but doesn't really drive) Occupation: Retired Prior Function Level of Independence: Requires assistive device for independence,Needs assistance with tranfers,Needs assistance with ADLs,Needs assistance with gait (daughter bumps wheelchair up/down steps for home entry)  Able to Take Stairs?: No Driving: No Vocation: Retired Surveyor, mining Baseline Vision/History: No visual deficits Patient Visual Report: No change from baseline Vision Assessment?: No  apparent visual deficits  Perception  Perception: Within Functional Limits Praxis Praxis: Intact Cognition Overall Cognitive Status: Within Functional Limits for tasks assessed Arousal/Alertness: Awake/alert Orientation Level: Person;Place;Situation Person: Oriented Place: Oriented Situation: Oriented Year: 2022 Month: May Day of Week: Correct Memory: Appears intact Immediate Memory Recall: Sock;Blue;Bed Memory Recall Sock: Without Cue Memory Recall Blue: Without Cue Memory Recall Bed: Without Cue Attention: Sustained;Selective Sustained Attention: Appears intact Selective Attention: Appears intact Awareness: Appears intact Safety/Judgment: Appears intact Sensation Sensation Light Touch: Impaired Detail Light Touch Impaired Details: Impaired LUE Hot/Cold: Not tested Proprioception: Not tested Stereognosis: Not tested Additional Comments: Pt with numbness in the index, middle digit, and thumb Coordination Gross Motor Movements are Fluid and Coordinated: No Fine Motor Movements are Fluid and Coordinated: No Coordination and Movement Description: Decreased LUE functional use secondary to pain and limitations in AROM at the shoulder, elbow, and digits.  Needs min to mod assist for use with selfcare tasks, with decreased ability to use the UE for donning clothing or washing the RUE. Motor  Motor Motor: Abnormal postural alignment and control Motor - Skilled Clinical Observations: Generalized weakness throughout with significant weakness and pain in the LUE secondary to surgery  Trunk/Postural Assessment  Cervical Assessment Cervical Assessment: Exceptions to [facility] Thoracic Assessment Thoracic Assessment: Exceptions to [facility] Lumbar Assessment Lumbar Assessment: Exceptions to [facility] Postural Control Postural Control: Deficits on evaluation Postural Limitations: weak core, difficulty with sitting balance  Balance Balance Balance Assessed: Yes Static Sitting Balance Static  Sitting - Balance Support: Feet supported;Left upper extremity supported Static Sitting - Level of Assistance: 5: Stand by assistance Static Sitting - Comment/# of Minutes: inital assist for getting forward into position for balance Dynamic Sitting Balance Dynamic Sitting - Balance Support: Feet supported;Left upper extremity supported Dynamic Sitting - Level of Assistance: 4: Min assist Sitting balance - Comments: assist for leaning to place slide board Static Standing Balance Static Standing - Balance Support: During functional activity Static Standing - Level of Assistance: 2: Max assist Static Standing - Comment/# of Minutes: UE support on bars in paralell bars Dynamic Standing Balance Dynamic Standing - Balance Support: During functional activity Dynamic Standing - Level of Assistance: 1: +1 Total assist Extremity/Trunk Assessment RUE Assessment RUE Assessment: Exceptions to [facility] Active Range of Motion (AROM) Comments: grossly WFLs for selfcare tasks General Strength Comments: 3+ throughout LUE Assessment LUE Assessment: Exceptions to [facility] Passive Range of Motion (PROM) Comments: elbow flexion 0-110, shoulder flexion 0-190, wrist flexion/extension WFLS Active Range of Motion (AROM) Comments: shoulder flexion 0-80 degrees, elbow flexion -5-100 degrees, wrist flexion 0-40 degrees with wrist extension 0-70 degrees, digit flexion 60% of normal.  Care Tool Care Tool Self Care Eating   Eating Assist Level: Set up assist    Oral Care    Oral Care Assist Level: Set up assist    Bathing   Body parts bathed by patient: Left arm;Chest;Abdomen;Right upper leg;Left upper leg;Face Body parts bathed by helper: Right arm;Front perineal area;Buttocks   Assist Level: 2 Helpers (sit to stand)    Upper Body Dressing(including orthotics)   What is the patient wearing?: Pull over shirt   Assist Level: Minimal Assistance - Patient > 75%    Lower Body Dressing (excluding footwear)   What is  the patient wearing?: Pants Assist for lower body dressing: 2 Helpers (sit to stand)    Putting on/Taking off footwear   What is the patient wearing?: Non-skid slipper socks Assist for footwear: Maximal Assistance - Patient 25 - 49%  Care Tool Toileting Toileting activity         Care Tool Bed Mobility Roll left and right activity   Roll left and right assist level: Moderate Assistance - Patient 50 - 74%    Sit to lying activity   Sit to lying assist level: Maximal Assistance - Patient 25 - 49%    Lying to sitting edge of bed activity   Lying to sitting edge of bed assist level: Maximal Assistance - Patient 25 - 49%     Care Tool Transfers Sit to stand transfer   Sit to stand assist level: Total Assistance - Patient < 25%    Chair/bed transfer   Chair/bed transfer assist level: Total Assistance - Patient < 25%     Toilet transfer         Care Tool Cognition Expression of Ideas and Wants Expression of Ideas and Wants: Without difficulty (complex and basic) - expresses complex messages without difficulty and with speech that is clear and easy to understand   Understanding Verbal and Non-Verbal Content Understanding Verbal and Non-Verbal Content: Understands (complex and basic) - clear comprehension without cues or repetitions   Memory/Recall Ability *first 3 days only      Refer to Care Plan for Long Term Goals  SHORT TERM GOAL WEEK 1 OT Short Term Goal 1 (Week 1): Pt will complete UB dressing and bathing with supervision EOB or from wheelchair. OT Short Term Goal 2 (Week 1): Pt will complete LB bathing with max assist sit to stand for two consecutive sessions. OT Short Term Goal 3 (Week 1): Pt will donn pants with max assist sit to stand OT Short Term Goal 4 (Week 1): Pt will complete stand pivot transfer to the 3:1 with mod assist using the RW for support. OT Short Term Goal 5 (Week 1): Pt will use the LUE to wash the RUE with supervision and min instructional  cueing for initiation.  Recommendations for other services: None    Skilled Therapeutic Intervention ADL ADL Eating: Set up Where Assessed-Eating: Wheelchair Grooming: Minimal assistance Where Assessed-Grooming: Wheelchair Upper Body Bathing: Minimal assistance Where Assessed-Upper Body Bathing: Edge of bed Lower Body Bathing: Dependent Where Assessed-Lower Body Bathing: Edge of bed Upper Body Dressing: Minimal assistance Where Assessed-Upper Body Dressing: Edge of bed Lower Body Dressing: Dependent Where Assessed-Lower Body Dressing: Edge of bed Toileting: Not assessed Toilet Transfer Method: Not assessed Tub/Shower Transfer: Not assessed Film/video editor: Not assessed Mobility  Bed Mobility Bed Mobility: Supine to Sit Rolling Right: Moderate Assistance - Patient 50-74% Supine to Sit: Maximal Assistance - Patient - Patient 25-49% Sit to Supine: 2 Helpers Transfers Sit to Stand: Total Assistance - Patient < 25%  Session Note:  Pt in bed to start session with the LUE propped up on pillows.  She was agreeable to completion of selfcare tasks EOB.  Max assist for transition from supine to sit EOB with pt exhibiting supervision level balance once sitting up.  She needed min instructional cueing to try and incorporate the LUE in to squeezing out the washcloth and hold the soap bottle to be opened.  She did not exhibit enough strength to open the new bottle, but once the seal was broken, she was able to complete removal of it.  Decreased pinch noted in the LUE when attempting to grasp her gown on the right side and remove it.  She needed mod assist for washing the RUE secondary to limited adduction and AROM in the left shoulder.  Mod assist needed for helping to assist lifting and maintaining the LEs over the opposite knee to wash her feet and attempt donning gripper socks.  She was able to stand with total assist but exhibited posterior bias with inability to pull her pants up over  her hips.  Total assist for squat pivot transfer to the wheelchair to complete session.  Discussed expectations for 2.5-3 week LOS and min assist goals at discharge.   Discharge Criteria: Patient will be discharged from OT if patient refuses treatment 3 consecutive times without medical reason, if treatment goals not met, if there is a change in medical status, if patient makes no progress towards goals or if patient is discharged from hospital.  The above assessment, treatment plan, treatment alternatives and goals were discussed and mutually agreed upon: by patient  Mario Coronado OTR/L 11/04/2020, 12:40 PM

## 2020-11-04 NOTE — Progress Notes (Signed)
Inpatient Rehabilitation Medication Review by a Pharmacist  A complete drug regimen review was completed for this patient to identify any potential clinically significant medication issues.  Clinically significant medication issues were identified:  yes   Type of Medication Issue Identified Description of Issue Urgent (address now) Non-Urgent (address on AM team rounds) Plan Plan Accepted by Provider? (Yes / No / Pending AM Rounds)  Drug Interaction(s) (clinically significant)       Duplicate Therapy       Allergy       No Medication Administration End Date       Incorrect Dose       Additional Drug Therapy Needed  Aranesp for anemia of ESRD to start 5/19 was not reordered on transfer.  Also noted patient not on a renal MVI. Non-Urgent Re-order Aranesp for 5/19 with HD.  Add Renal MVI 1 tab QHS. Messaged Renal PA.  Plan accepted.  Other         Name of provider notified for urgent issues identified: Jen Mow, Renal PA  Provider Method of Notification: secure chat   For non-urgent medication issues to be resolved on team rounds tomorrow morning a [facility] Secure Chat Handoff was sent to: n/a   Pharmacist comments:   Time spent performing this drug regimen review (minutes):  10   Maxten Shuler, Rocky Crafts 11/04/2020 11:22 AM

## 2020-11-04 NOTE — Progress Notes (Signed)
Initial Nutrition Assessment  DOCUMENTATION CODES:   Not applicable  INTERVENTION:  Continue Nepro Shake po BID, each supplement provides 425 kcal and 19 grams protein  Encourage adequate PO intake.   NUTRITION DIAGNOSIS:   Increased nutrient needs related to chronic illness (ESRD HD) as evidenced by estimated needs.  GOAL:   Patient will meet greater than or equal to 90% of their needs  MONITOR:   PO intake,Supplement acceptance,Skin,Weight trends,Labs,I & O's  REASON FOR ASSESSMENT:   Malnutrition Screening Tool    ASSESSMENT:   75 year old female with history of ESRD- HD TTS, HTN, PAF, glaucoma, OA, aortic valve stenosis, initially presents with large intramuscular biceps hematoma with concerns of compartment syndrome and was taken to OR on 10/27/20 for evacuation of hematoma and primary repair of left brachial artery pseudoaneurysm. Therapy ongoing and patient noted to have deficits due to LUE pain as well as  BLE weakness with instability. Pt with functional deficits due to myopathy/B/L foot drop. CIR due to functional decline.   Pt reports appetite is fine during time of visit. Meal completion 60-65%. Pt reports eating well PTA with usual consumption of at least 2 meals a day with snacks and occasional Nepro shakes. Pt currently has Nepro shakes ordered and has been consuming them. RD to continue with current orders to aid in caloric and protein needs. Per weight records, pt with a 11% weight loss over the past 1 year, not significant for time frame. Weight loss likely related to fluid status.   Unable to complete Nutrition-Focused physical exam at this time. Pt actively eating lunch during time of visit.   Labs and medications reviewed.   Diet Order:   Diet Order            Diet renal with fluid restriction Fluid restriction: 1200 mL Fluid; Room service appropriate? Yes; Fluid consistency: Thin  Diet effective now                 EDUCATION NEEDS:   Not  appropriate for education at this time  Skin:  Skin Assessment: Reviewed RN Assessment  Last BM:  5/17  Height:   Ht Readings from Last 1 Encounters:  10/26/20 5\' 3"  (1.6 m)    Weight:   Wt Readings from Last 1 Encounters:  11/03/20 74.5 kg   BMI:  Body mass index is 29.09 kg/m.  Estimated Nutritional Needs:   Kcal:  1850-2050  Protein:  85-100 grams  Fluid:  1.2 L/day  Corrin Parker, MS, RD, LDN RD pager number/after hours weekend pager number on Am[employer].

## 2020-11-04 NOTE — H&P (Signed)
Physical Medicine and Rehabilitation Admission H&P        Chief Complaint  Patient presents with  . Functional deficits due to ICU myopathy/B/L foot drop      HPI: [name] is a 75 year old female with history of ESRD- HD TTS, HTN, PAF, glaucoma, OA,  Aortic valve stenosis with DOE, prior CIR stay 2013 for debility post septic shock/encephalopathy, left hip fracture 12/21; who was admitted via ED on 10/26/20 with LUE pain, swelling and numbness with reports of  having infiltrated her AVF 05/03 but able complete HD on 05/07 after brief rest.  She was found to have large intramuscular biceps hematoma with concerns of compartment syndrome and was taken to OR on 10/27/20 for evacuation of hematoma and primary repair of left brachial artery pseudoaneurysm by Dr. Donzetta Matters. Post procedure, numbness almost resolved with +thrill noted in fistula. Acute on chronic anemia treated with two total units of PRBC and weekly aranesp added today.  HD ongoing and she continues to be limited by fatigue, nausea and acute on chronic diarrhea. Therapy ongoing and patient noted to have deficits due to LUE pain as well as  BLE weakness with instability. CIR recommended due to functional decline.    Pt reports has been in and out of hospital since L hip fx in January, where it took 1 month to treat her C Diff and wetn out worse than she "went in".  Also had 16 admissions to hospital for UTIs 1 year, so is on Keflex for prophylaxis.  Also pain is OK controlled, but bothering her before meds can be given again.  LUE numb/tingling and PAINFUL.  Felt like would explode before surgery, now a "dull roar".  Hasn't been able to control bowel and bladder for 3+ years- goes, but always has diarrhea and accidents/incontinent bowel AND bladder- voids at least 2-3x/day minimum.    LBM this AM; .      Review of Systems  Constitutional: Negative for chills and fever.  HENT: Negative for hearing loss and tinnitus.   Eyes:  Negative for blurred vision and double vision.  Respiratory: Negative for cough and shortness of breath.   Cardiovascular: Positive for leg swelling. Negative for chest pain and palpitations.  Gastrointestinal: Positive for diarrhea (twice a week on average). Negative for heartburn and nausea.       Urgency with B/B incontinence   Genitourinary: Negative for dysuria.  Musculoskeletal: Positive for joint pain.  Skin: Negative for itching and rash.  Neurological: Positive for sensory change (left hand numb/cold) and weakness. Negative for dizziness and headaches.  Psychiatric/Behavioral: The patient has insomnia (at times due to arm throbbing).   All other systems reviewed and are negative.         Past Medical History:  Diagnosis Date  . Arthritis of left knee    . Cardiomyopathy ([place])      a. h/o LV dysfunction EF 20-25% in 2013 due to sepsis.>> improved to normal   . Chronic diastolic CHF (congestive heart failure) ([place])      10/ 2013 in setting of septic shock  . Complication of anesthesia      use a little anesthesia , per patient MD states she quit breathing (2016); hard to wake up  . ESRD (end stage renal disease) ([place])      dialysis Tues, Thurs, Sat [address], sees dr deterding   . GERD (gastroesophageal reflux disease)    . Glaucoma  both eyes  . H/O hiatal hernia      a. CT 2017: large gastric hiatal hernia.  Marland Kitchen Headache(784.0)      migraine hx of  . History of blood transfusion 04/13/2015   . History of echocardiogram      a. Echo 6/17: EF 60-65%, normal wall motion, mild MR, atrial septal lipomatous hypertrophy, PASP 34 mmHg, possible trivial free-flowing pericardial effusion along RV free wall // b. Echo 5/17: Mild LVH, EF 55-60%, normal wall motion, grade 1 diastolic dysfunction, trivial MR, severe LAE, mild RAE, PASP 42 mmHg  . History of kidney stones      10/18/2019: per patient "has a couple currently one in each kidney"  . History of nephrostomy 04/11/2015     currently inplace 04/28/2015  removed now  . History of nuclear stress test      a. Myoview 1/14 - Marked ischemia in the basal anterior, mid anterior, apical septal and apical inferior regions, EF 63% >> LHC normal   . Hyperlipidemia    . Hypertension      medication removed from regimen due to low blood pressure   . Iron deficiency      hx  . Myocardial infarction [facility]) 2013    10/18/2019: per patient "in 2013)  . Nephrolithiasis 2002, 2006    bilateral  . Normal coronary arteries 2014    a. LHC in 1/14: normal coronary arteries  . PAF (paroxysmal atrial fibrillation) ([place])      a. 10/ 2013  in setting of Septic Shock //  b. recurrent during admit for pneumonia, L effusion >> placed on Amiodarone // Coumadin for anticoagulation  . Pneumonia jan 2018, last tme lungs clear now    dx 10-06-2014 per CXR--  on 10-27-2014 pt states finished antibiotic and denies cough or fever  . Primary localized osteoarthritis of right hip 10/22/2019  . S/P hemodialysis catheter insertion ([place]) 04/11/2015     right anterior chest , only used once   . Sigmoid diverticulosis    . SOB (shortness of breath) on exertion      10/18/2019: per patient "get short of breath with activity sometimes due to heart valve"  . UTI (urinary tract infection) 05/10/2016           Past Surgical History:  Procedure Laterality Date  . AV FISTULA PLACEMENT Left 06/02/2015    Procedure: BRACHIOCEPHALIC ARTERIOVENOUS (AV) FISTULA CREATION ;  Surgeon: Conrad Montpelier, MD;  Location: [place];  Service: Vascular;  Laterality: Left;  . BASCILIC VEIN TRANSPOSITION Left 07/27/2015    Procedure: FIRST STAGE BASILIC VEIN TRANSPOSITION LEFT UPPER ARM;  Surgeon: Conrad Salisbury, MD;  Location: [place];  Service: Vascular;  Laterality: Left;  . BASCILIC VEIN TRANSPOSITION Left 09/2015    second phase  . BASCILIC VEIN TRANSPOSITION Left 10/12/2015    Procedure: SECOND STAGE BASILIC VEIN TRANSPOSITION LEFT ARM;  Surgeon: Conrad Donnelly, MD;  Location:  [place];  Service: Vascular;  Laterality: Left;  . BREAST BIOPSY Left 08/23/07    benign fibrocystic with duct ectasia  . CARDIAC CATHETERIZATION   07-11-2012  dr Irish Lack    Abnormal stress test/   normal coronary arteries/  LVEDP  4mmHg  . CARDIOVASCULAR STRESS TEST   06-26-2012  dr Irish Lack    marked ischemia in the basal anterior, mid anterior, apical inferior regions/  normal LVF, ef 63%  . CATARACT EXTRACTION W/ INTRAOCULAR LENS  IMPLANT, BILATERAL      . COLONOSCOPY WITH  PROPOFOL N/A 10/17/2016    Procedure: COLONOSCOPY WITH PROPOFOL;  Surgeon: Garlan Fair, MD;  [facility]: [facility];  Service: Endoscopy;  Laterality: N/A;  . CYSTOSCOPY W/ URETERAL STENT PLACEMENT   04/04/2012    Procedure: CYSTOSCOPY WITH RETROGRADE PYELOGRAM/URETERAL STENT PLACEMENT;  Surgeon: Ailene Rud, MD;  Location: [place];  Service: Urology;  Laterality: Left;  . CYSTOSCOPY W/ URETERAL STENT PLACEMENT Bilateral 05/04/2015    Procedure: CYSTOSCOPY WITH BILATERAL RETROGRADE PYELOGRAM/ WITH INTERPRETATION, EXCHANGE OF RIGHT URETERAL STENT REPLACEMENT AND PLACEMENT LEFT URETERAL STENT PLACEMENT EXAMINATION OF VAGINA;  Surgeon: Carolan Clines, MD;  Location: [facility];  Service: Urology;  Laterality: Bilateral;  . CYSTOSCOPY WITH STENT PLACEMENT Right 10/28/2014    Procedure: RIGHT URETERAL STENT PLACEMENT;  Surgeon: Irine Seal, MD;  Location[facility];  Service: Urology;  Laterality: Right;  . CYSTOSCOPY WITH STENT PLACEMENT Right 02/26/2015    Procedure: CYSTOSCOPY RETROGRADE PYELOGRAM WITH STENT PLACEMENT;  Surgeon: Cleon Gustin, MD;  [facility]: [facility];  Service: Urology;  Laterality: Right;  . CYSTOSCOPY/RETROGRADE/URETEROSCOPY/STONE EXTRACTION WITH BASKET Right 11/21/2014    Procedure: CYSTOSCOPY/RIGHT RETROGRADE PYELOGRAM/RIGHT URETEROSCOPY/BASKET EXTRACTION/RIGHT PYELOSCOPY/LASER OF STONE/RIGHT DOUBLE J STENT;  Surgeon: Carolan Clines, MD;  Location: [place];   Service: Urology;  Laterality: Right;  . ESOPHAGOGASTRODUODENOSCOPY (EGD) WITH PROPOFOL Left 08/25/2018    Procedure: ESOPHAGOGASTRODUODENOSCOPY (EGD) WITH PROPOFOL;  Surgeon: Ronald Lobo, MD;  Location: [place];  Service: Endoscopy;  Laterality: Left;  . EXTRACORPOREAL SHOCK WAVE LITHOTRIPSY   05-28-2012  &  10-08-2012  . HEMATOMA EVACUATION Left 10/27/2020    Procedure: EVACUATION HEMATOMA, repair of Pseudoaneurysm;  Surgeon: Waynetta Sandy, MD;  Location: [place];  Service: Vascular;  Laterality: Left;  . HIP ARTHROPLASTY Left 06/09/2020    Procedure: ARTHROPLASTY BIPOLAR HIP (HEMIARTHROPLASTY);  Surgeon: Marchia Bond, MD;  Location: [place];  Service: Orthopedics;  Laterality: Left;  . HOLMIUM LASER APPLICATION Right 08/23/91    Procedure: HOLMIUM LASER APPLICATION;  Surgeon: Carolan Clines, MD;  Location[facility];  Service: Urology;  Laterality: Right;  . IR GENERIC HISTORICAL   02/01/2016    IR NEPHROSTOMY EXCHANGE RIGHT 02/01/2016 Greggory Keen, MD [facility]  . IR GENERIC HISTORICAL   02/24/2016    IR PATIENT EVAL TECH 0-60 MINS 02/24/2016 Aletta Edouard, MD [facility]  . KNEE ARTHROSCOPY Left 02-14-2003  . LAPAROSCOPIC CHOLECYSTECTOMY   03-23-2005  . TOTAL ABDOMINAL HYSTERECTOMY W/ BILATERAL SALPINGOOPHORECTOMY   1993    secondary to fibroids  . TOTAL KNEE ARTHROPLASTY Left 10/22/2019  . TOTAL KNEE ARTHROPLASTY Left 10/22/2019    Procedure: TOTAL KNEE ARTHROPLASTY;  Surgeon: Marchia Bond, MD;  Location: [place];  Service: Orthopedics;  Laterality: Left;  . TRANSTHORACIC ECHOCARDIOGRAM   04-09-2012    normal LVF,  ef 60-65%,  mild LAE,  mild TR, trivial MR and PR           Family History  Problem Relation Age of Onset  . Hypertension Mother    . Cancer Mother 54        breast  . Dementia Mother    . Hypertension Brother    . Diabetes Brother    . Heart disease Brother          before age 9  . Cancer Father 51        pancreatic  .  Heart failure Paternal Grandmother    . Bladder Cancer Maternal Grandfather        Social History:  Lives with daughter. Has been limited to wheelchair/walker since December due to hip fracture and was still getting home therapy. She reports that she has never smoked. She has never used smokeless tobacco. She reports that she does not drink alcohol and does not use drugs.          Allergies  Allergen Reactions  . Astemizole Nausea And Vomiting  . Fluorouracil Rash  . Vicodin [Hydrocodone-Acetaminophen] Nausea And Vomiting  . Chlorhexidine Rash      Sunburn    rash  . Percocet [Oxycodone-Acetaminophen] Nausea And Vomiting            Medications Prior to Admission  Medication Sig Dispense Refill  . acetaminophen (TYLENOL) 325 MG tablet Take 2 tablets (650 mg total) by mouth every 6 (six) hours as needed for mild pain, fever or headache.      Marland Kitchen amiodarone (PACERONE) 200 MG tablet Take 1 tablet (200 mg total) by mouth daily. 90 tablet 3  . apixaban (ELIQUIS) 2.5 MG TABS tablet Take 1 tablet (2.5 mg total) by mouth 2 (two) times daily. 60 tablet 5  . bimatoprost (LUMIGAN) 0.01 % SOLN Place 1 drop into both eyes at bedtime.      . cephALEXin (KEFLEX) 250 MG capsule Take 250 mg by mouth daily.      Marland Kitchen lidocaine-prilocaine (EMLA) cream Apply 1 application topically 3 (three) times a week.      . loperamide (IMODIUM A-D) 2 MG tablet Take 2 mg by mouth 4 (four) times daily as needed for diarrhea or loose stools.      Marland Kitchen LORazepam (ATIVAN) 0.5 MG tablet Take 0.5 mg by mouth daily as needed for anxiety (or panic attack).      . multivitamin (RENA-VIT) TABS tablet Take 1 tablet by mouth at bedtime.      . Nutritional Supplements (FEEDING SUPPLEMENT, NEPRO CARB STEADY,) LIQD Take 237 mLs by mouth 2 (two) times daily between meals. (Patient taking differently: Take 237 mLs by mouth daily as needed (supplement).)   0  . ondansetron (ZOFRAN) 4 MG tablet Take 4 mg by mouth every 8 (eight) hours as needed  for nausea or vomiting.      . saccharomyces boulardii (FLORASTOR) 250 MG capsule Take 1 capsule (250 mg total) by mouth 2 (two) times daily. 60 capsule 0  . timolol (BETIMOL) 0.5 % ophthalmic solution Place 1 drop into both eyes daily.      . calcium acetate (PHOSLO) 667 MG capsule Take 1 capsule (667 mg total) by mouth 3 (three) times daily with meals. (Patient not taking: No sig reported)          Drug Regimen Review  Drug regimen was reviewed and remains appropriate with no significant issues identified   Home: Home Living Family/patient expects to be discharged to:: Inpatient rehab Living Arrangements: Children (daughter) Available Help at Discharge: Family,Available 24 hours/day Type of Home: House Home Access: Stairs to enter [employer] of Steps: 2 Entrance Stairs-Rails: None Home Layout: One level Bathroom Shower/Tub: Chiropodist: Handicapped height Bathroom Accessibility: Yes Home Equipment: Grab bars - tub/shower,Walker - 4 wheels,Cane - quad,Cane - single point,Wheelchair - manual,Walker - 2 wheels  Lives With: Daughter   Functional History: Prior Function Level of Independence: Needs assistance Gait / Transfers Assistance Needed: Prior to fall in December and broke her hip, she was independent. Since then she has been utilizing her manual w/c and RW, walking up to ~30 ft with min guard. Pt independent with  w/c. Pt needing assistance to power up to stand and occasionally to perform bed mobility. Daughter pushes pt in w/c up and down the stairs. ADL's / Homemaking Assistance Needed: Daughter assists with bathing and donning/doffing socks/shoes. Daughter provides transportation, cleans, cooks, and assists with financial and medication management. Comments: Has not driven in year due to knee surgery   Functional Status:  Mobility: Bed Mobility Overal bed mobility: Needs Assistance Bed Mobility: Supine to Sit,Sit to Supine Rolling: Min  assist Supine to sit: Mod assist,+2 for physical assistance Sit to supine: +2 for physical assistance,Max assist General bed mobility comments: Pt able to assist bring BLE's off edge of bed slowly, HHA to pull up with R hand to sit up. Assist for trunk guidance and BLE elevation back into bed Transfers Overall transfer level: Needs assistance Equipment used: Rolling walker (2 wheeled) Transfers: Sit to/from Stand Sit to Stand: Mod assist,+2 physical assistance,+2 safety/equipment  Lateral/Scoot Transfers: Mod assist General transfer comment: On first two attempts, pt only able to achieve minimal hip clearance. Successful to stand on last two trials, pushing both hands off edge of bed, bilateral feet blocked to prevent anterior slide. Tactile/verbal cues for upright posture, decreased left lateral lean, upward gaze. Able to take side steps with significant time/effort, cues for weight shifting. Significant trunk flexion. Pt reporting "my legs are giving out." Ambulation/Gait Ambulation/Gait assistance: Min assist,+2 physical assistance,+2 safety/equipment Gait Distance (Feet): 7 Feet Assistive device: Rolling walker (2 wheeled) Gait Pattern/deviations: Step-through pattern,Decreased step length - right,Decreased step length - left,Decreased stride length,Decreased weight shift to right,Decreased weight shift to left,Trunk flexed General Gait Details: Ambulated anteriorly with minAx2 to steady, bring chair behind pt, and manage lines. Cues to shift weight and advance legs, with increased time and noted trunk sway. No appreciative knee buckling noted even though pt reported feeling like her legs were going to "give out". Gait velocity: reduced Gait velocity interpretation: <1.31 ft/sec, indicative of household ambulator   ADL: ADL Overall ADL's : Needs assistance/impaired Eating/Feeding: Set up,Sitting Eating/Feeding Details (indicate cue type and reason): encouraged pt to use LUE to reach for  items on tray and use L hand as stabilize while R hand completes precision task of opening items Grooming: Minimal assistance,Sitting Grooming Details (indicate cue type and reason): in recliner Upper Body Bathing: Moderate assistance,Sitting Upper Body Bathing Details (indicate cue type and reason): in recliner Lower Body Bathing: Maximal assistance Lower Body Bathing Details (indicate cue type and reason): Mod A +2 sit<>stand from recliner (using gait belt and chair pad) Upper Body Dressing : Maximal assistance,Sitting Upper Body Dressing Details (indicate cue type and reason): in recliner Lower Body Dressing: Total assistance Lower Body Dressing Details (indicate cue type and reason): Mod A +2 sit<>stand from recliner (using gait belt and chair pad) Toilet Transfer Details (indicate cue type and reason): min A +2 sit<>stand from raised bed>min A +2 ambulation RE 8 feet. Mod A +2 sit<>stand from recliner Toileting- Clothing Manipulation and Hygiene: Total assistance Toileting - Clothing Manipulation Details (indicate cue type and reason): Mod A +2 sit<>stand from recliner (using gait belt and chair pad) General ADL Comments: session focused on LUE HEP. pt continues to be mostly limited by increased pain in LUE and decreased activity tolerance   Cognition: Cognition Overall Cognitive Status: Within Functional Limits for tasks assessed Orientation Level: Oriented X4 Cognition Arousal/Alertness: Awake/alert Behavior During Therapy: Flat affect Overall Cognitive Status: Within Functional Limits for tasks assessed General Comments: A&Ox4.     Blood pressure Marland Kitchen)  99/52, pulse 69, temperature 97.9 F (36.6 C), temperature source Oral, resp. rate 17, height 5\' 3"  (1.6 m), weight 75.5 kg, last menstrual period 01/19/1992, SpO2 98 %. Physical Exam Vitals and nursing note reviewed. Exam conducted with a chaperone present.  Constitutional:      Appearance: Normal appearance.     Comments: Pt  laying in bed- appears frail; LUE up on pillow; daughter at bedside, NAD  HENT:     Head: Normocephalic and atraumatic.     Right Ear: External ear normal.     Left Ear: External ear normal.     Nose: Nose normal. No congestion.     Mouth/Throat:     Mouth: Mucous membranes are dry.     Pharynx: Oropharynx is clear. No oropharyngeal exudate.  Eyes:     General:        Right eye: No discharge.        Left eye: No discharge.     Extraocular Movements: Extraocular movements intact.  Cardiovascular:     Comments: Sounds RRR- Significant holosystolic murmur 4/6- can hear throughout chest.  No JVD; HD catheter in R chest with bruising Pulmonary:     Comments: CTA B/L- no W/R/R- good air movement Abdominal:     Comments: Soft, NT, ND, (+)BS   Musculoskeletal:     Cervical back: Normal range of motion and neck supple.     Comments: LUE with edema and resolving ecchymosis. Right chest wall with IJ in place and surrounding ecchymosis.   RUE- biceps/triceps 3+/5, WE/Grip and FA 4+/5 LUE- 3-/5 in same muscles LEs- HF 2/5, KE 3+/5, and KF 3-/5, DF 3-/5 and PF 3-/5 Very swollen LUE- not tight  Skin:    General: Skin is warm and dry.     Comments: A lot of brownish/purple bruising- R hand and entire LUE Slightly boggy heels B/L   Neurological:     Mental Status: She is alert.     Comments: Ox3 Decreased sensation in LUE from mid upper arm down to fingers Otherwise, intact in RUE and LE's   Psychiatric:     Comments: Tired, but appropriate, slightly flat affect        Lab Results Last 48 Hours        Results for orders placed or performed during the hospital encounter of 10/26/20 (from the past 48 hour(s))  CBC with Differential/Platelet     Status: Abnormal    Collection Time: 11/02/20  2:00 AM  Result Value Ref Range    WBC 11.3 (H) 4.0 - 10.5 K/uL    RBC 2.89 (L) 3.87 - 5.11 MIL/uL    Hemoglobin 8.7 (L) 12.0 - 15.0 g/dL    HCT 26.8 (L) 36.0 - 46.0 %    MCV 92.7 80.0 - 100.0  fL    MCH 30.1 26.0 - 34.0 pg    MCHC 32.5 30.0 - 36.0 g/dL    RDW 23.5 (H) 11.5 - 15.5 %    Platelets 203 150 - 400 K/uL    nRBC 0.0 0.0 - 0.2 %    Neutrophils Relative % 82 %    Neutro Abs 9.3 (H) 1.7 - 7.7 K/uL    Lymphocytes Relative 7 %    Lymphs Abs 0.8 0.7 - 4.0 K/uL    Monocytes Relative 8 %    Monocytes Absolute 0.9 0.1 - 1.0 K/uL    Eosinophils Relative 3 %    Eosinophils Absolute 0.3 0.0 - 0.5 K/uL    Basophils  Relative 0 %    Basophils Absolute 0.1 0.0 - 0.1 K/uL    Immature Granulocytes 0 %    Abs Immature Granulocytes 0.05 0.00 - 0.07 K/uL      Comment: Performed at [facility], [place] [address] [place], [place] [address]  CBC with Differential/Platelet     Status: Abnormal    Collection Time: 11/03/20  1:10 AM  Result Value Ref Range    WBC 10.7 (H) 4.0 - 10.5 K/uL    RBC 3.05 (L) 3.87 - 5.11 MIL/uL    Hemoglobin 9.2 (L) 12.0 - 15.0 g/dL    HCT 28.4 (L) 36.0 - 46.0 %    MCV 93.1 80.0 - 100.0 fL    MCH 30.2 26.0 - 34.0 pg    MCHC 32.4 30.0 - 36.0 g/dL    RDW 23.5 (H) 11.5 - 15.5 %    Platelets 227 150 - 400 K/uL    nRBC 0.0 0.0 - 0.2 %    Neutrophils Relative % 81 %    Neutro Abs 8.7 (H) 1.7 - 7.7 K/uL    Lymphocytes Relative 6 %    Lymphs Abs 0.7 0.7 - 4.0 K/uL    Monocytes Relative 8 %    Monocytes Absolute 0.8 0.1 - 1.0 K/uL    Eosinophils Relative 3 %    Eosinophils Absolute 0.3 0.0 - 0.5 K/uL    Basophils Relative 1 %    Basophils Absolute 0.1 0.0 - 0.1 K/uL    Immature Granulocytes 1 %    Abs Immature Granulocytes 0.14 (H) 0.00 - 0.07 K/uL      Comment: Performed at [facility] [address], [place], [place] [address]  Renal function panel     Status: Abnormal    Collection Time: 11/03/20  8:43 AM  Result Value Ref Range    Sodium 133 (L) 135 - 145 mmol/L    Potassium 3.7 3.5 - 5.1 mmol/L    Chloride 98 98 - 111 mmol/L    CO2 27 22 - 32 mmol/L    Glucose, Bld 105 (H) 70 - 99 mg/dL      Comment: Glucose reference range applies  only to samples taken after fasting for at least 8 hours.    BUN 35 (H) 8 - 23 mg/dL    Creatinine, Ser 5.14 (H) 0.44 - 1.00 mg/dL    Calcium 8.2 (L) 8.9 - 10.3 mg/dL    Phosphorus 3.0 2.5 - 4.6 mg/dL    Albumin 1.5 (L) 3.5 - 5.0 g/dL    GFR, Estimated 8 (L) >60 mL/min      Comment: (NOTE) Calculated using the CKD-EPI Creatinine Equation (2021)      Anion gap 8 5 - 15      Comment: Performed at [facility] [address] [place], [place] [address]      Imaging Results (Last 48 hours)  No results found.           Medical Problem List and Plan: 1.  ICU myopathy with B/L foot drop/LE>UE weakness secondary to prolonged hospitalization/recent hospitalization for LUE thrombus s/p thrombectomy             -patient may not shower due to IJ catheter             -ELOS/Goals: ~3-4 weeks- min Assist hopefully, due to severe generalized weakness,  2.  Antithrombotics: -DVT/anticoagulation:  Pharmaceutical: Other (comment)--renal dose Eliquis.              -  antiplatelet therapy: N/A 3. Pain Management: Oxycodone prn for severe pain. Tramadol added prn for moderate pain.              --will add low dose gabapentin for neuropathic pain and increase oxy to q4 hours for pain since cannot do a lot of nerve pain meds due to ESRD  4. Mood: LCSW to follow for evaluation and support.              -antipsychotic agents: N/A 5. Neuropsych: This patient is capable of making decisions on his own behalf. 6. Skin/Wound Care:  Routine pressure relief measures.  7. Fluids/Electrolytes/Nutrition: Strict I/O with daily weights. Renal diet/1200 cc FR.  8. Hematoma LUE s/p evacuation: continue to monitor pulse and for symptoms. Also elevate LUE 9. Acute on chronic anemia: Improved post transfusion. Continue aransep weekly.  10. PAF: Monitor HR tid--continue amiodarone and Eliquis.  11. ESRD: HD on TTS after therapy to help with tolerance of therapy.  12. Chronic UTI: On low dose Keflex for suppression.   13. B/L foot drop- will order prevalon boots for moderate B/L foot drop     Bary Leriche, PA-C 11/03/2020      I have personally performed a face to face diagnostic evaluation of this patient and formulated the key components of the plan.  Additionally, I have personally reviewed laboratory data, imaging studies, as well as relevant notes and concur with the physician assistant's documentation above.   The patient's status has not changed from the original H&P.  Any changes in documentation from the acute care chart have been noted above.

## 2020-11-04 NOTE — Progress Notes (Signed)
Inpatient Rehabilitation Care Coordinator Assessment and Plan Patient Details  Name: [name] MRN: [id number] Date of Birth: 1945-10-24  Today's Date: 11/04/2020  Hospital Problems: Principal Problem:   Intensive care (ICU) myopathy Active Problems:   Debility   ESRD on dialysis [facility])   Acute on chronic anemia   Post-operative pain  Past Medical History:  Past Medical History:  Diagnosis Date  . Arthritis of left knee   . Cardiomyopathy ([place])    a. h/o LV dysfunction EF 20-25% in 2013 due to sepsis.>> improved to normal   . Chronic diastolic CHF (congestive heart failure) ([place])    10/ 2013 in setting of septic shock  . Complication of anesthesia    use a little anesthesia , per patient MD states she quit breathing (2016); hard to wake up  . ESRD (end stage renal disease) ([place])    dialysis Tues, Thurs, Sat [address], sees dr deterding   . GERD (gastroesophageal reflux disease)   . Glaucoma    both eyes  . H/O hiatal hernia    a. CT 2017: large gastric hiatal hernia.  Marland Kitchen Headache(784.0)    migraine hx of  . History of blood transfusion 04/13/2015   . History of echocardiogram    a. Echo 6/17: EF 60-65%, normal wall motion, mild MR, atrial septal lipomatous hypertrophy, PASP 34 mmHg, possible trivial free-flowing pericardial effusion along RV free wall // b. Echo 5/17: Mild LVH, EF 55-60%, normal wall motion, grade 1 diastolic dysfunction, trivial MR, severe LAE, mild RAE, PASP 42 mmHg  . History of kidney stones    10/18/2019: per patient "has a couple currently one in each kidney"  . History of nephrostomy 04/11/2015   currently inplace 04/28/2015  removed now  . History of nuclear stress test    a. Myoview 1/14 - Marked ischemia in the basal anterior, mid anterior, apical septal and apical inferior regions, EF 63% >> LHC normal   . Hyperlipidemia   . Hypertension    medication removed from regimen due to low blood pressure   . Iron deficiency    hx  . Myocardial  infarction [facility]) 2013   10/18/2019: per patient "in 2013)  . Nephrolithiasis 2002, 2006   bilateral  . Normal coronary arteries 2014   a. LHC in 1/14: normal coronary arteries  . PAF (paroxysmal atrial fibrillation) ([place])    a. 10/ 2013  in setting of Septic Shock //  b. recurrent during admit for pneumonia, L effusion >> placed on Amiodarone // Coumadin for anticoagulation  . Pneumonia jan 2018, last tme lungs clear now   dx 10-06-2014 per CXR--  on 10-27-2014 pt states finished antibiotic and denies cough or fever  . Primary localized osteoarthritis of right hip 10/22/2019  . S/P hemodialysis catheter insertion ([place]) 04/11/2015    right anterior chest , only used once   . Sigmoid diverticulosis   . SOB (shortness of breath) on exertion    10/18/2019: per patient "get short of breath with activity sometimes due to heart valve"  . UTI (urinary tract infection) 05/10/2016   Past Surgical History:  Past Surgical History:  Procedure Laterality Date  . AV FISTULA PLACEMENT Left 06/02/2015   Procedure: BRACHIOCEPHALIC ARTERIOVENOUS (AV) FISTULA CREATION ;  Surgeon: Conrad Mesita, MD;  Location: [place];  Service: Vascular;  Laterality: Left;  . BASCILIC VEIN TRANSPOSITION Left 07/27/2015   Procedure: FIRST STAGE BASILIC VEIN TRANSPOSITION LEFT UPPER ARM;  Surgeon: Conrad , MD;  [facility];  Service: Vascular;  Laterality: Left;  . BASCILIC VEIN TRANSPOSITION Left 09/2015   second phase  . BASCILIC VEIN TRANSPOSITION Left 10/12/2015   Procedure: SECOND STAGE BASILIC VEIN TRANSPOSITION LEFT ARM;  Surgeon: Conrad Hollins, MD;  Location: [place];  Service: Vascular;  Laterality: Left;  . BREAST BIOPSY Left 08/23/07   benign fibrocystic with duct ectasia  . CARDIAC CATHETERIZATION  07-11-2012  dr Irish Lack   Abnormal stress test/   normal coronary arteries/  LVEDP  54mmHg  . CARDIOVASCULAR STRESS TEST  06-26-2012  dr Irish Lack   marked ischemia in the basal anterior, mid anterior, apical inferior  regions/  normal LVF, ef 63%  . CATARACT EXTRACTION W/ INTRAOCULAR LENS  IMPLANT, BILATERAL    . COLONOSCOPY WITH PROPOFOL N/A 10/17/2016   Procedure: COLONOSCOPY WITH PROPOFOL;  Surgeon: Garlan Fair, MD;  [facility]: [facility];  Service: Endoscopy;  Laterality: N/A;  . CYSTOSCOPY W/ URETERAL STENT PLACEMENT  04/04/2012   Procedure: CYSTOSCOPY WITH RETROGRADE PYELOGRAM/URETERAL STENT PLACEMENT;  Surgeon: Ailene Rud, MD;  Location: [place];  Service: Urology;  Laterality: Left;  . CYSTOSCOPY W/ URETERAL STENT PLACEMENT Bilateral 05/04/2015   Procedure: CYSTOSCOPY WITH BILATERAL RETROGRADE PYELOGRAM/ WITH INTERPRETATION, EXCHANGE OF RIGHT URETERAL STENT REPLACEMENT AND PLACEMENT LEFT URETERAL STENT PLACEMENT EXAMINATION OF VAGINA;  Surgeon: Carolan Clines, MD;  Location: [facility];  Service: Urology;  Laterality: Bilateral;  . CYSTOSCOPY WITH STENT PLACEMENT Right 10/28/2014   Procedure: RIGHT URETERAL STENT PLACEMENT;  Surgeon: Irine Seal, MD;  Location: [facility];  Service: Urology;  Laterality: Right;  . CYSTOSCOPY WITH STENT PLACEMENT Right 02/26/2015   Procedure: CYSTOSCOPY RETROGRADE PYELOGRAM WITH STENT PLACEMENT;  Surgeon: Cleon Gustin, MD;  [facility];  Service: Urology;  Laterality: Right;  . CYSTOSCOPY/RETROGRADE/URETEROSCOPY/STONE EXTRACTION WITH BASKET Right 11/21/2014   Procedure: CYSTOSCOPY/RIGHT RETROGRADE PYELOGRAM/RIGHT URETEROSCOPY/BASKET EXTRACTION/RIGHT PYELOSCOPY/LASER OF STONE/RIGHT DOUBLE J STENT;  Surgeon: Carolan Clines, MD;  Location: [place];  Service: Urology;  Laterality: Right;  . ESOPHAGOGASTRODUODENOSCOPY (EGD) WITH PROPOFOL Left 08/25/2018   Procedure: ESOPHAGOGASTRODUODENOSCOPY (EGD) WITH PROPOFOL;  Surgeon: Ronald Lobo, MD;  Location: [place];  Service: Endoscopy;  Laterality: Left;  . EXTRACORPOREAL SHOCK WAVE LITHOTRIPSY  05-28-2012  &  10-08-2012  . HEMATOMA EVACUATION Left 10/27/2020    Procedure: EVACUATION HEMATOMA, repair of Pseudoaneurysm;  Surgeon: Waynetta Sandy, MD;  Location: [place];  Service: Vascular;  Laterality: Left;  . HIP ARTHROPLASTY Left 06/09/2020   Procedure: ARTHROPLASTY BIPOLAR HIP (HEMIARTHROPLASTY);  Surgeon: Marchia Bond, MD;  Location: [place];  Service: Orthopedics;  Laterality: Left;  . HOLMIUM LASER APPLICATION Right 12/21/1636   Procedure: HOLMIUM LASER APPLICATION;  Surgeon: Carolan Clines, MD;  Location: [facility];  Service: Urology;  Laterality: Right;  . IR GENERIC HISTORICAL  02/01/2016   IR NEPHROSTOMY EXCHANGE RIGHT 02/01/2016 Greggory Keen, MD [facility]  . IR GENERIC HISTORICAL  02/24/2016   IR PATIENT EVAL TECH 0-60 MINS 02/24/2016 Aletta Edouard, MD [facility]  . KNEE ARTHROSCOPY Left 02-14-2003  . LAPAROSCOPIC CHOLECYSTECTOMY  03-23-2005  . TOTAL ABDOMINAL HYSTERECTOMY W/ BILATERAL SALPINGOOPHORECTOMY  1993   secondary to fibroids  . TOTAL KNEE ARTHROPLASTY Left 10/22/2019  . TOTAL KNEE ARTHROPLASTY Left 10/22/2019   Procedure: TOTAL KNEE ARTHROPLASTY;  Surgeon: Marchia Bond, MD;  Location: [place];  Service: Orthopedics;  Laterality: Left;  . TRANSTHORACIC ECHOCARDIOGRAM  04-09-2012   normal LVF,  ef 60-65%,  mild LAE,  mild TR, trivial MR and  PR   Social History:  reports that she has never smoked. She has never used smokeless tobacco. She reports that she does not drink alcohol and does not use drugs.  Family / Support Systems Marital Status: Widow/Widower Patient Roles: Parent Children: Michelle-daughter [phone] Other Supports: Some church members Anticipated Caregiver: [name] Ability/Limitations of Caregiver: [name] owns a pet sitting service but has flexible hours Caregiver Availability: 24/7 Family Dynamics: Close iwht daughter whom she lives with. Daughter was having to assist after hip fracture and after SNF stay. She is an only child but does have some church members who will  check on them  Social History Preferred language: English Religion: [employer] Cultural Background: no issues Education: HS Read: Yes Write: Yes Employment Status: Retired Public relations account executive Issues: No issues Guardian/Conservator: None-according to MD pt is capable of making her own decisions while here. Per pt's pref she wants daughter involved.   Abuse/Neglect Abuse/Neglect Assessment Can Be Completed: Yes Physical Abuse: Denies Verbal Abuse: Denies Sexual Abuse: Denies Exploitation of patient/patient's resources: Denies Self-Neglect: Denies  Emotional Status Pt's affect, behavior and adjustment status: Pt is motivated to be able to do as mcuh as she can for herself, when she had C-diff it really set her back and she feels she is starting over now. She is very deconditioned and needs time to build back her activity tolerance and endurance Recent Psychosocial Issues: other health issues-fractured hip 05/2020, SNF stay and C-diff still recovering from all of this along with new diagnosis Psychiatric History: History of anxiety takes medications and finds it helpful. Do feel she would benefit from seeing neuro-psych while here,with all she has been through in a short time Substance Abuse History: No issues  Patient / Family Perceptions, Expectations & Goals Pt/Family understanding of illness & functional limitations: Pt and daughrer has a good understanding of her current condition and treatment plan. Both talk with the MD's and feel she is moving forward with her recovery and so glad she is on rehab. Premorbid pt/family roles/activities: Mom, Dialysis pt, retiree, church member, etc Anticipated changes in roles/activities/participation: resume Pt/family expectations/goals: Pt states: " I hope to do as much as I can for myself before I leave here."  Daughter states: " I will do whatever I need to for her."  [employer]: Other (Comment) Mallie Mussel Buras  T,TH Sat HD) Premorbid Home Care/DME Agencies: Other (Comment) (had [facility]TA-has wc, rw, cane, rollator bsc) Transportation available at discharge: Daughter provides transport along with [facility] for HD treatments Resource referrals recommended: Neuropsychology  Discharge Planning Living Arrangements: Children Support Systems: Children,Friends/neighbors,Church/faith community Type of Residence: Private residence Insurance Resources: Multimedia programmer (specify) Psychologist, prison and probation services) Financial Resources: Therapist, art Screen Referred: No Living Expenses: Lives with family Money Management: Family Does the patient have any problems obtaining your medications?: No Home Management: Daughter Patient/Family Preliminary Plans: Return home with daughter who was assisting her prior to admission due to hip fracture still recovering from in 05/2020. Daughter has a pet sitting business she runs from home. Aware team evaluating today, so aware may not be able to give ELOS today. Care Coordinator Barriers to Discharge: Insurance for SNF coverage Care Coordinator Anticipated Follow Up Needs: HH/OP  Clinical Impression Pleasant female who is motivated to do well here and realizes the intensive therapy she will get on this unit. Her daughter is very involved and has been assisting her prior to admission after hip fracture and complications from c-diff. Pt was ambulating 30 ft with  rw min guard prior to admission and used wheelchair also. On neuro-psych list to be seen while here. Being evaluated today in therapies.  Elease Hashimoto 11/04/2020, 10:36 AM

## 2020-11-04 NOTE — Progress Notes (Signed)
[facility] Individual Statement of Services  Patient Name:  [name]  Date:  11/04/2020  Welcome to the [facility].  Our goal is to provide you with an individualized program based on your diagnosis and situation, designed to meet your specific needs.  With this comprehensive rehabilitation program, you will be expected to participate in at least 3 hours of rehabilitation therapies Monday-Friday, with modified therapy programming on the weekends.  Your rehabilitation program will include the following services:  Physical Therapy (PT), Occupational Therapy (OT), 24 hour per day rehabilitation nursing, Therapeutic Recreaction (TR), Neuropsychology, Care Coordinator, Rehabilitation Medicine, Nutrition Services and Pharmacy Services  Weekly team conferences will be held on Wednesday to discuss your progress.  Your Inpatient Rehabilitation Care Coordinator will talk with you frequently to get your input and to update you on team discussions.  Team conferences with you and your family in attendance may also be held.   Estimated length of stay: 21-24 days   Overall anticipated outcome: overall min assist level  Depending on your progress and recovery, your program may change. Your Inpatient Rehabilitation Care Coordinator will coordinate services and will keep you informed of any changes. Your Inpatient Rehabilitation Care Coordinator's name and contact numbers are listed  below.  The following services may also be recommended but are not provided by the [facility]:    [place] will be made to provide these services after discharge if needed.  Arrangements include referral to agencies that provide these services.  Your insurance has been verified to be:  [facility] primary doctor is:  Radio broadcast assistant  Pertinent information will be shared with  your doctor and your insurance company.  Inpatient Rehabilitation Care Coordinator:  Ovidio Kin, [place] or Emilia Beck  Information discussed with and copy given to patient by: Elease Hashimoto, 11/04/2020, 10:39 AM

## 2020-11-04 NOTE — Progress Notes (Signed)
[facility] Progress Note   Subjective:   Patient seen and examined at bedside.  Tolerated first PT ok per patient.  Denies SOB, CP, n/v and abdominal pain.    Objective Vitals:   11/03/20 1421 11/03/20 1923 11/04/20 0533 11/04/20 1314  BP: (!) 143/58 129/60 (!) 125/55 140/61  Pulse: 74 71 68 67  Resp: 15 16 14 16   Temp: 97.8 F (36.6 C) 98.5 F (36.9 C) (!) 96.7 F (35.9 C) (!) 97.5 F (36.4 C)  TempSrc: Oral Oral  Oral  SpO2: 100% 99% 98% 97%  Weight:       Physical Exam General:chronically ill appearing female in NAD Heart:RRR, +7/0 systolic murmur Lungs:CTAB anterolaterally, nml WOB Abdomen:soft, NTND Extremities:1+ LE edema, LUE edema/bruising, dressing in place over AVF Dialysis Access: TDC, LU AVF +b/t   Filed Weights   11/03/20 1201  Weight: 74.5 kg    Intake/Output Summary (Last 24 hours) at 11/04/2020 1314 Last data filed at 11/04/2020 1310 Gross per 24 hour  Intake 446 ml  Output --  Net 446 ml    Additional Objective Labs: Basic Metabolic Panel: Recent Labs  Lab 10/29/20 0741 10/31/20 1250 11/03/20 0843  NA 134* 133* 133*  K 3.8 3.6 3.7  CL 99 99 98  CO2 29 30 27   GLUCOSE 85 79 105*  BUN 27* 23 35*  CREATININE 4.66* 3.91* 5.14*  CALCIUM 8.1* 8.0* 8.2*  PHOS 2.4* 2.2* 3.0   Liver Function Tests: Recent Labs  Lab 10/29/20 0741 10/31/20 1250 11/03/20 0843  ALBUMIN 1.6* 1.5* 1.5*   CBC: Recent Labs  Lab 10/29/20 0741 10/29/20 0741 10/31/20 0402 11/01/20 0132 11/02/20 0200 11/03/20 0110  WBC 11.6*  --  10.1 9.4 11.3* 10.7*  NEUTROABS  --    < > 8.0* 7.6 9.3* 8.7*  HGB 7.8*  --  8.7* 8.5* 8.7* 9.2*  HCT 23.3*  --  26.4* 25.9* 26.8* 28.4*  MCV 87.9  --  90.1 90.9 92.7 93.1  PLT 209  --  222 197 203 227   < > = values in this interval not displayed.    Medications:  . amiodarone  200 mg Oral Daily  . apixaban  2.5 mg Oral BID  . cephALEXin  250 mg Oral q1800  . [START ON 11/05/2020] darbepoetin (ARANESP)  injection - DIALYSIS  100 mcg Intravenous Q Thu-HD  . famotidine  20 mg Oral Daily  . feeding supplement (NEPRO CARB STEADY)  237 mL Oral BID BM  . gabapentin  100 mg Oral QHS  . latanoprost  1 drop Both Eyes QHS  . lidocaine-prilocaine  1 application Topical Once per day on Mon Wed Fri  . multivitamin  1 tablet Oral QHS  . saccharomyces boulardii  250 mg Oral BID  . timolol  1 drop Both Eyes Daily    Dialysis Orders: [facility] TTS  4h 400/500 71kg 2/2 bathP2 Hep none RIJ TDC (AVF works butinfiltrated and not using) -Mircera 75 q 2 wks (last 5/5) No VDRA   Assessment/Plan: 1. LUEcompartment syndrome:evacuation of hematoma/ repair of bleeding brachial artery pseudoaneurysm on 5/10 per Dr. Cain/VVS. Pain control per PMD. 2. ESRD - HD TTS.Orders written for HD tomorrow per regular schedule. K 3.7. 3. Hypertension/volume - BPvariable but mostly wellcontrolled. No BP meds on home list.Increased volume on exam, per weights 2-3kg over dry, had been lowering as OP as patient had been losing weight. Continue to titrate down as tolerated.  4. Anemia of CKD/ABLA- Hgb 6.5 at lowest  here post-op. s/p 2 unit prbcs 5/11. Recent ESA dose as outpatientbut will be due again on Thursday 5/19, will order.last Hgb ^9.2.  5. Metabolic bone disease -CCa 10, phos improved.No binders or VDRA. Encourage nutrition.  6. Chronic combined CHF -- volume removal with UF on HD  7. PAFib- On Eliquis/amiodarone 8. Nutrition -Renal diet/fluid restriction.  Encourage nutrition. Prot supp for low albumin 9. Debility - now in CIR.   Jen Mow, PA-C [facility] 11/04/2020,1:14 PM  LOS: 1 day

## 2020-11-04 NOTE — Patient Care Conference (Signed)
Inpatient RehabilitationTeam Conference and Plan of Care Update Date: 11/04/2020   Time: 11:30 AM    Patient Name: [name]      Medical Record Number: [id number]  Date of Birth: 22-Jul-1945 Sex: Female         Room/Bed: 4W02C/4W02C-01 Payor Info: Payor: [place] / Plan: BCBS MEDICARE / Product Type: *No Product type* /    Admit Date/Time:  11/03/2020  1:38 PM  Primary Diagnosis:  Intensive care (ICU) myopathy  Hospital Problems: Principal Problem:   Intensive care (ICU) myopathy Active Problems:   Debility   ESRD on dialysis ([place])   Acute on chronic anemia   Post-operative pain    Expected Discharge Date: Expected Discharge Date:  (ELOS 2 weeks, evals pending)  Team Members Present: Physician leading conference: Dr. Delice Lesch Care Coodinator Present: Dorien Chihuahua, RN, BSN, CRRN;Becky Dupree, LCSW Nurse Present: Dorien Chihuahua, RN PT Present: Magda Kiel, PT OT Present: Other (comment) Hulda Marin, OT) SLP Present: Other (comment) Gunnar Fusi, SLP) PPS Coordinator present : Gunnar Fusi, SLP     Current Status/Progress Goal Weekly Team Focus  Bowel/Bladder   pt is incontinent of B/B LBM 05/17  pt will regain functional continence  timed toileting Q2H   Swallow/Nutrition/ Hydration             ADL's             Mobility   mod A supine to sit, max A squat pivot to w/c, +2 A to stand in parallel bars and mod to max for weight shifts but unable to take steps with R knee buckling.  slide board transfer with +2 A.  CGA/min A w/c level  Evaluation today, transfers, possible for foot propel for w/c if sans Roho, sitting balance, activity tolerance   Communication             Safety/Cognition/ Behavioral Observations            Pain   Left arm pain relieved with oxycodone IR 5mg  prn pt also has Ultram 50mg  prn, and tylenol prn  Pt pain will be <=4/10  assess pt for pain qshift and prn medicate as directed and reassess for relief    Skin   Left arm surgical incision with gauze dressing. Left lateral rib cage and flank ecchymotic  skin will remain free of infection and will be free from breakdown  assess skin qshift and prn     Discharge Planning:  new eval plan to discharge home with daughter whom she lives with, will need to confirm 24/7 care. Daughter does run a Engineer, civil (consulting) Discussion: Patient struggling with pain management issues. DTI on sacrum with blancheable periwound.  Patient on target to meet rehab goals: Currently mod - max assit for squat pivot transfers. +2 sit - stand and slide board transfers. Difficulty with weight shifting due to recent hip repair and knee buckling. Min assist goals set for discharge.  *See Care Plan and progress notes for long and short-term goals.   Revisions to Treatment Plan:  MD to recheck labs and urine  Teaching Needs: Transfers, toileting, medications, skin care, etc.   Current Barriers to Discharge: Decreased caregiver support, Home enviroment access/layout, Wound care, Hemodialysis and Weight bearing restrictions  Possible Resolutions to Barriers: Family education     Medical Summary Current Status: ICU myopathy with B/L foot drop/LE>UE weakness secondary to prolonged hospitalization/recent hospitalization for LUE thrombus s/p thrombectomy  Barriers to Discharge: Medical stability;Incontinence  Possible Resolutions to [employer]: Therapies, optimize pain meds, follow labs - WBCs, abx for chronic UTI   Continued Need for Acute Rehabilitation Level of Care: The patient requires daily medical management by a physician with specialized training in physical medicine and rehabilitation for the following reasons: Direction of a multidisciplinary physical rehabilitation program to maximize functional independence : Yes Medical management of patient stability for increased activity during participation in an intensive rehabilitation regime.: Yes Analysis  of laboratory values and/or radiology reports with any subsequent need for medication adjustment and/or medical intervention. : Yes   I attest that I was present, lead the team conference, and concur with the assessment and plan of the team.   Dorien Chihuahua B 11/04/2020, 2:25 PM

## 2020-11-04 NOTE — Progress Notes (Signed)
Courtney Heys, MD  Physician  Physical Medicine and Rehabilitation  PMR Pre-admission     Signed  Date of Service:  10/30/2020 11:20 AM      Related encounter: ED to Hosp-Admission (Discharged) from 10/26/2020 in [place] all [x]Manual[x]Template[]Copied  Added by: [x]Skyeler Scalese, Vertis Kelch, RN[x]Lovorn, Jinny Blossom, MD   []Hover for details  PMR Admission Coordinator Pre-Admission Assessment   Patient: [name] is an 75 y.o., female MRN: [id number] DOB: Dec 24, 1945 Height: 5' 3" (160 cm) Weight: 75.5 kg   Insurance Information HMO: yes    PPO:      PCP:      IPA:      80/20:      OTHER:  PRIMARY: Blue Medicare      Policy#: [id number]      Subscriber: pt CM Name: [name]      Phone#: [phone]     Fax#: [phone] Pre-Cert#: TBD   Approved for 7 days   Employer:  Benefits:  Phone #: [phone]     Name: 5/13 Eff. Date: 06/20/2020     Deduct: none      Out of Pocket Max: $4200      Life Max: none CIR: $$335 co pay per day days 1 until 6      SNF: $188 co pay per day  Outpatient: $40 co pay per visit     Co-Pay: visits per medical neccesity Home Health: 100%      Co-Pay: visits per medical neccesity DME: 80%     Co-Pay: 20% Providers: in network  SECONDARY: none      Policy#:      Phone#:    Development worker, community:       Phone#:    The Engineer, [employer]" for patients in Inpatient Rehabilitation Facilities with attached "Privacy Act [facility] Records" was provided and verbally reviewed with: Patient   Emergency Contact Information         Contact Information     Name Relation Home Work [place] Daughter     [phone]         Current Medical History  Patient Admitting Diagnosis: compartmental syndrome, debility   History of Present Illness: 75 year old female with history of ESRD on Hemodialysis, combined systolic/diastolic HF,  PAF on AC, HTN and  glaucoma. Presented 10/26/2020 to ED with worsening swelling and pain in Left upper extremity. Her LUE AVF was infiltrated on dialysis on 5/3. Swelling worsened and she was sent for placement of tunneled hemodialysis catheter to allow her fistula to rest. She has developed loss of sensation to her fingers. Large intramuscular biceps hematoma on CT with concern for compartment syndrome. Vascular consulted and she underwent evacuation of hematoma  And repair of left brachial artery pseudoaneurysm by Dr. Donzetta Matters on 10/27/2020. Hgb 6.6 and received transfusions.    Patient's medical record from [facility]  has been reviewed by the rehabilitation admission coordinator and physician.   Past Medical History      Past Medical History:  Diagnosis Date  . Arthritis of left knee    . Cardiomyopathy ([place])      a. h/o LV dysfunction EF 20-25% in 2013 due to sepsis.>> improved to normal   . Chronic diastolic CHF (congestive heart failure) ([place])      10/ 2013 in setting of septic shock  . Complication  of anesthesia      use a little anesthesia , per patient MD states she quit breathing (2016); hard to wake up  . ESRD (end stage renal disease) ([place])      dialysis Tues, Thurs, Sat [address], sees dr deterding   . GERD (gastroesophageal reflux disease)    . Glaucoma      both eyes  . H/O hiatal hernia      a. CT 2017: large gastric hiatal hernia.  Marland Kitchen Headache(784.0)      migraine hx of  . History of blood transfusion 04/13/2015   . History of echocardiogram      a. Echo 6/17: EF 60-65%, normal wall motion, mild MR, atrial septal lipomatous hypertrophy, PASP 34 mmHg, possible trivial free-flowing pericardial effusion along RV free wall // b. Echo 5/17: Mild LVH, EF 55-60%, normal wall motion, grade 1 diastolic dysfunction, trivial MR, severe LAE, mild RAE, PASP 42 mmHg  . History of kidney stones      10/18/2019: per patient "has a couple currently one in each kidney"  . History of nephrostomy  04/11/2015    currently inplace 04/28/2015  removed now  . History of nuclear stress test      a. Myoview 1/14 - Marked ischemia in the basal anterior, mid anterior, apical septal and apical inferior regions, EF 63% >> LHC normal   . Hyperlipidemia    . Hypertension      medication removed from regimen due to low blood pressure   . Iron deficiency      hx  . Myocardial infarction [facility]) 2013    10/18/2019: per patient "in 2013)  . Nephrolithiasis 2002, 2006    bilateral  . Normal coronary arteries 2014    a. LHC in 1/14: normal coronary arteries  . PAF (paroxysmal atrial fibrillation) ([place])      a. 10/ 2013  in setting of Septic Shock //  b. recurrent during admit for pneumonia, L effusion >> placed on Amiodarone // Coumadin for anticoagulation  . Pneumonia jan 2018, last tme lungs clear now    dx 10-06-2014 per CXR--  on 10-27-2014 pt states finished antibiotic and denies cough or fever  . Primary localized osteoarthritis of right hip 10/22/2019  . S/P hemodialysis catheter insertion ([place]) 04/11/2015     right anterior chest , only used once   . Sigmoid diverticulosis    . SOB (shortness of breath) on exertion      10/18/2019: per patient "get short of breath with activity sometimes due to heart valve"  . UTI (urinary tract infection) 05/10/2016      Family History   family history includes Bladder Cancer in her maternal grandfather; Cancer (age of onset: 2) in her father; Cancer (age of onset: 36) in her mother; Dementia in her mother; Diabetes in her brother; Heart disease in her brother; Heart failure in her paternal grandmother; Hypertension in her brother and mother.   Prior Rehab/Hospitalizations Has the patient had prior rehab or hospitalizations prior to admission? Yes    CIR 2013 [facility] 12/21 after hip fx   Has the patient had major surgery during 100 days prior to admission? Yes              Current Medications   Current Facility-Administered Medications:   .  (feeding supplement) PROSource Plus liquid 30 mL, 30 mL, Oral, BID BM, Ejigiri, Ogechi Grace, PA-C, 30 mL at 11/02/20 1410 .  0.9 %  sodium chloride infusion, 100  mL, Intravenous, PRN, Penninger, Ria Comment, PA .  0.9 %  sodium chloride infusion, 100 mL, Intravenous, PRN, Penninger, Ria Comment, PA .  acetaminophen (TYLENOL) tablet 650 mg, 650 mg, Oral, Q6H PRN, Kayleen Memos, DO, 650 mg at 11/03/20 6384 .  alteplase (CATHFLO ACTIVASE) injection 2 mg, 2 mg, Intracatheter, Once PRN, Penninger, Ria Comment, PA .  amiodarone (PACERONE) tablet 200 mg, 200 mg, Oral, Daily, Hall, Carole N, DO, 200 mg at 11/02/20 0933 .  apixaban (ELIQUIS) tablet 2.5 mg, 2.5 mg, Oral, BID, Barb Merino, MD, 2.5 mg at 11/02/20 2306 .  cephALEXin (KEFLEX) capsule 250 mg, 250 mg, Oral, Daily, Barb Merino, MD, 250 mg at 11/02/20 0934 .  [START ON 11/05/2020] Darbepoetin Alfa (ARANESP) injection 100 mcg, 100 mcg, Intravenous, Q Thu-HD, Penninger, Lindsay, PA .  famotidine (PEPCID) tablet 20 mg, 20 mg, Oral, BID, Ghimire, Kuber, MD, 20 mg at 11/02/20 1814 .  heparin injection 1,000 Units, 1,000 Units, Dialysis, PRN, Penninger, Ria Comment, PA .  HYDROmorphone (DILAUDID) injection 0.5 mg, 0.5 mg, Intravenous, Q4H PRN, Irene Pap N, DO, 0.5 mg at 11/01/20 0951 .  latanoprost (XALATAN) 0.005 % ophthalmic solution 1 drop, 1 drop, Both Eyes, QHS, Ghimire, Kuber, MD, 1 drop at 11/02/20 2306 .  lidocaine (PF) (XYLOCAINE) 1 % injection 5 mL, 5 mL, Intradermal, PRN, Penninger, Ria Comment, PA .  lidocaine-prilocaine (EMLA) cream 1 application, 1 application, Topical, PRN, Penninger, Lindsay, PA .  loperamide (IMODIUM) capsule 2 mg, 2 mg, Oral, Q4H PRN, Barb Merino, MD, 2 mg at 11/02/20 2324 .  LORazepam (ATIVAN) tablet 0.5 mg, 0.5 mg, Oral, Daily PRN, Barb Merino, MD .  metoprolol tartrate (LOPRESSOR) injection 2.5 mg, 2.5 mg, Intravenous, Q8H PRN, Hall, Carole N, DO .  ondansetron (ZOFRAN-ODT) disintegrating tablet 4 mg, 4 mg, Oral,  Once, Trifan, Carola Rhine, MD .  oxyCODONE (Oxy IR/ROXICODONE) immediate release tablet 5 mg, 5 mg, Oral, Q6H PRN, Irene Pap N, DO, 5 mg at 11/03/20 0537 .  pentafluoroprop-tetrafluoroeth (GEBAUERS) aerosol 1 application, 1 application, Topical, PRN, Penninger, Lindsay, PA .  prochlorperazine (COMPAZINE) injection 10 mg, 10 mg, Intravenous, Q6H PRN, Hall, Carole N, DO, 10 mg at 11/02/20 1104 .  senna-docusate (Senokot-S) tablet 2 tablet, 2 tablet, Oral, QHS, Hall, Carole N, DO, 2 tablet at 10/31/20 2333 .  timolol (TIMOPTIC) 0.5 % ophthalmic solution 1 drop, 1 drop, Both Eyes, Daily, Hall, [place] N, DO, 1 drop at 11/02/20 0934   Patients Current Diet:     Diet Order                      Diet renal with fluid restriction Fluid restriction: 1200 mL Fluid; Room service appropriate? Yes; Fluid consistency: Thin  Diet effective now                      Precautions / Restrictions Precautions Precautions: Fall Precaution Comments: LUE has staples on inside of upper arm and skin tear on bottom part of arm Restrictions Weight Bearing Restrictions: No    Has the patient had 2 or more falls or a fall with injury in the past year? Yes   Prior Activity Level Limited Community (1-2x/wk): supervision short distance since 12/21 after hip fx   Prior Functional Level Self Care: Did the patient need help bathing, dressing, using the toilet or eating? Needed some help   Indoor Mobility: Did the patient need assistance with walking from room to room (with or without device)? Needed some help  Stairs: Did the patient need assistance with internal or external stairs (with or without device)? Needed some help   Functional Cognition: Did the patient need help planning regular tasks such as shopping or remembering to take medications? Independent   Home Assistive Devices / Equipment Home Equipment: Grab bars - tub/shower,Walker - 4 wheels,Cane - quad,Cane - single point,Wheelchair - manual,Walker -  2 wheels   Prior Device Use: Indicate devices/aids used by the patient prior to current illness, exacerbation or injury? Manual wheelchair and Walker   Current Functional Level Cognition   Overall Cognitive Status: Within Functional Limits for tasks assessed Orientation Level: Oriented X4 General Comments: A&Ox4.    Extremity Assessment (includes Sensation/Coordination)   Upper Extremity Assessment: LUE deficits/detail LUE Deficits / Details: Decreased strength and A/AA/PROM; ACE wrap removed by surgeon in room giving pt more ability for AROM in hand, wrist, forearm and elbow LUE Coordination: decreased fine motor,decreased gross motor  Lower Extremity Assessment: Generalized weakness (stiffness noted in bil legs, denies numbness/tingling)     ADLs   Overall ADL's : Needs assistance/impaired Eating/Feeding: Set up,Sitting Eating/Feeding Details (indicate cue type and reason): encouraged pt to use LUE to reach for items on tray and use L hand as stabilize while R hand completes precision task of opening items Grooming: Minimal assistance,Sitting Grooming Details (indicate cue type and reason): in recliner Upper Body Bathing: Moderate assistance,Sitting Upper Body Bathing Details (indicate cue type and reason): in recliner Lower Body Bathing: Maximal assistance Lower Body Bathing Details (indicate cue type and reason): Mod A +2 sit<>stand from recliner (using gait belt and chair pad) Upper Body Dressing : Maximal assistance,Sitting Upper Body Dressing Details (indicate cue type and reason): in recliner Lower Body Dressing: Total assistance Lower Body Dressing Details (indicate cue type and reason): Mod A +2 sit<>stand from recliner (using gait belt and chair pad) Toilet Transfer Details (indicate cue type and reason): min A +2 sit<>stand from raised bed>min A +2 ambulation RE 8 feet. Mod A +2 sit<>stand from recliner Toileting- Clothing Manipulation and Hygiene: Total  assistance Toileting - Clothing Manipulation Details (indicate cue type and reason): Mod A +2 sit<>stand from recliner (using gait belt and chair pad) General ADL Comments: session focused on LUE HEP. pt continues to be mostly limited by increased pain in LUE and decreased activity tolerance     Mobility   Overal bed mobility: Needs Assistance Bed Mobility: Supine to Sit,Sit to Supine Rolling: Min assist Supine to sit: Mod assist,+2 for physical assistance Sit to supine: +2 for physical assistance,Max assist General bed mobility comments: Pt able to assist bring BLE's off edge of bed slowly, HHA to pull up with R hand to sit up. Assist for trunk guidance and BLE elevation back into bed     Transfers   Overall transfer level: Needs assistance Equipment used: Rolling walker (2 wheeled) Transfers: Sit to/from Stand Sit to Stand: Mod assist,+2 physical assistance,+2 safety/equipment  Lateral/Scoot Transfers: Mod assist General transfer comment: On first two attempts, pt only able to achieve minimal hip clearance. Successful to stand on last two trials, pushing both hands off edge of bed, bilateral feet blocked to prevent anterior slide. Tactile/verbal cues for upright posture, decreased left lateral lean, upward gaze. Able to take side steps with significant time/effort, cues for weight shifting. Significant trunk flexion. Pt reporting "my legs are giving out."     Ambulation / Gait / Stairs / Wheelchair Mobility   Ambulation/Gait Ambulation/Gait assistance: Min assist,+2 physical assistance,+2 safety/equipment Gait Distance (Feet):  7 Feet Assistive device: Rolling walker (2 wheeled) Gait Pattern/deviations: Step-through pattern,Decreased step length - right,Decreased step length - left,Decreased stride length,Decreased weight shift to right,Decreased weight shift to left,Trunk flexed General Gait Details: Ambulated anteriorly with minAx2 to steady, bring chair behind pt, and manage lines. Cues  to shift weight and advance legs, with increased time and noted trunk sway. No appreciative knee buckling noted even though pt reported feeling like her legs were going to "give out". Gait velocity: reduced Gait velocity interpretation: <1.31 ft/sec, indicative of household ambulator     Posture / Balance Dynamic Sitting Balance Sitting balance - Comments: UE support sitting statically EOB, supervision for safety. Balance Overall balance assessment: Needs assistance Sitting-balance support: No upper extremity supported,Feet supported Sitting balance-Leahy Scale: Fair Sitting balance - Comments: UE support sitting statically EOB, supervision for safety. Standing balance support: Bilateral upper extremity supported Standing balance-Leahy Scale: Poor Standing balance comment: Reliant on bil UE and external assist.     Special needs/care consideration ESRD on hemodialysis T TH Sat [facility] Sc[facility] transportation Patient reports chronic diarrhea after last year's dx of c diff    Previous Home Environment  Living Arrangements: Children (daughter)  Lives With: Daughter Available Help at Discharge: Family,Available 24 hours/day Type of Home: House Home Layout: One level Home Access: Stairs to enter Entrance Stairs-Rails: None Entrance Stairs-Number of Steps: 2 Bathroom Shower/Tub: Chiropodist: Handicapped height Bathroom Accessibility: Yes How Accessible: Accessible via walker Home Care Services: Yes Type of Home Care Services: Home PT   Discharge Living Setting Plans for Discharge Living Setting: Patient's home,Lives with (comment) (daughter) Type of Home at Discharge: House Discharge Home Layout: One level Discharge Home Access: Stairs to enter Entrance Stairs-Rails: None Entrance Stairs-Number of Steps: 2 Discharge Bathroom Shower/Tub: Tub/shower unit Discharge Bathroom Toilet: Handicapped height Discharge Bathroom Accessibility: Yes How Accessible:  Accessible via walker Does the patient have any problems obtaining your medications?: No   Social/Family/Support Systems Contact Information: daughter, [name] Anticipated Caregiver: daughter Anticipated Ambulance person Information: see above Ability/Limitations of Caregiver: owns a pet sitting service Caregiver Availability: 24/7 Discharge Plan Discussed with Primary Caregiver: Yes Is Caregiver In Agreement with Plan?: Yes Does Caregiver/Family have Issues with Lodging/Transportation while Pt is in Rehab?: No   Goals Patient/Family Goal for Rehab: Mod I to supervision PT, supervision to min OT Expected length of stay: ELOS 2 weeks Additional Information: ESRd on Hemodialysis T TH Sat Pt/Family Agrees to Admission and willing to participate: Yes Program Orientation Provided & Reviewed with Pt/Caregiver Including Roles  & Responsibilities: Yes   Decrease burden of Care through IP rehab admission: n/a   Possible need for SNF placement upon discharge: not anticipated   Patient Condition: I have reviewed medical records from [facility] , spoken with  patient. I met with patient at the bedside for inpatient rehabilitation assessment.  Patient will benefit from ongoing PT and OT, can actively participate in 3 hours of therapy a day 5 days of the week, and can make measurable gains during the admission.  Patient will also benefit from the coordinated team approach during an Inpatient Acute Rehabilitation admission.  The patient will receive intensive therapy as well as Rehabilitation physician, nursing, social worker, and care management interventions.  Due to bladder management, bowel management, safety, skin/wound care, disease management, medication administration, pain management and patient education the patient requires 24 hour a day rehabilitation nursing.  The patient is currently mod assist overall with mobility and basic ADLs.  Discharge setting and therapy post discharge at  home with home health is anticipated.  Patient has agreed to participate in the Acute Inpatient Rehabilitation Program and will admit today.   Preadmission Screen Completed By:  Cleatrice Burke, 11/03/2020 10:06 AM ______________________________________________________________________   Discussed status with Dr. Dagoberto Ligas  on  11/03/2020 at  1008 and received approval for admission today.   Admission Coordinator:  Cleatrice Burke, RN, time  1761 Date  11/03/2020    Assessment/Plan: Diagnosis: 1. Does the need for close, 24 hr/day Medical supervision in concert with the patient's rehab needs make it unreasonable for this patient to be served in a less intensive setting? Yes 2. Co-Morbidities requiring supervision/potential complications: ESRD on HD, CHF, HTN, ABLA on chronic anemia; PAF on AC 3. Due to bowel management, safety, skin/wound care, disease management, medication administration, pain management and patient education, does the patient require 24 hr/day rehab nursing? Yes 4. Does the patient require coordinated care of a physician, rehab nurse, PT, OT, and SLP to address physical and functional deficits in the context of the above medical diagnosis(es)? Yes Addressing deficits in the following areas: balance, endurance, locomotion, strength, transferring, bathing, dressing, feeding, grooming and toileting 5. Can the patient actively participate in an intensive therapy program of at least 3 hrs of therapy 5 days a week? Yes 6. The potential for patient to make measurable gains while on inpatient rehab is good 7. Anticipated functional outcomes upon discharge from inpatient rehab: supervision and min assist PT, supervision and min assist OT, n/a SLP 8. Estimated rehab length of stay to reach the above functional goals is: 2 weeks 9. Anticipated discharge destination: Home 10. Overall Rehab/Functional Prognosis: good     MD Signature:            Revision History                                        Note Details  Jan Fireman, MD File Time 11/03/2020 10:44 AM  Author Type Physician Status Signed  Last Editor Courtney Heys, MD Service Physical Medicine and [place] # [id number] Admit Date 11/03/2020

## 2020-11-04 NOTE — Progress Notes (Signed)
Physical Therapy Session Note  Patient Details  Name: [name] MRN: [id number] Date of Birth: 11/17/1945  Today's Date: 11/04/2020 PT Individual Time: 6160-7371 PT Individual Time Calculation (min): 69 min   Short Term Goals: Week 1:  PT Short Term Goal 1 (Week 1): Patient will perform supine to sit with min A and mod cues. PT Short Term Goal 2 (Week 1): Patient will demonstrate sitting balance with S during simple functional task. PT Short Term Goal 3 (Week 1): Patient will perform sit to stand with mod A at least 50% of attempts. PT Short Term Goal 4 (Week 1): Patient will initiate ambulation.  Skilled Therapeutic Interventions/Progress Updates:    Pt received sitting in w/c and agreeable to therapy session.  Transported to/from gym in w/c for time management and energy conservation. R lateral scoot transfer w/c>EOM using transfer board, max assist for board placement, and mod assist for scooting hips - max cuing to maintain anterior trunk lean/weight shift and to utilize B LEs to assist with hip clearance but pt unable - cuing for UE positioning to assist with lifting and scooting wit pt's L hand intermittently slipping off the surface it was grasping due to impaired sensation and strength. Therapist removed cushion from wheelchair to allow lower floor-to-seat height in preporation for LE w/c propulsion. L lateral scoot EOM>w/c using transfer board with same cuing and assist as above. R UE and B LE w/c propulsion ~41ft with min assist and very slow propulsion speed with minimal forward movement - pt would benefit from shoes to allow improved traction on floor although at this time has very small forward steps despite cuing for improvement. R lateral scoot w/c>EOM using transfer board with assist as described above and pt demonstrating some improvement in motor planning/sequencing.  Sitting on mat performed the following B LE exercises:  - long arc quads using 3lb ankle weight 2x15reps each  LE with external target to promote full knee extension  - hip abduction against level 3 theraband resistance 2x15 reps L lateral scoot back to w/c using transfer board as above. Transported back to room.   R lateral scoot w/c>EOB using transfer board with same assist and cuing as above with pt still unable to clear hips during transfer at this time.   Sit>supine with mod assist for B LE management into the bed.  Performed the following supine B LE exercises:   - bridging 2x10reps with pt demonstrating significant glute and hamstring weakness with inability to lift hips even slightly off bed despite cuing and facilitation for improvement - heel slides with active assist 2x10 reps each LE with cuing for proper form/technique  Pt left supine in bed with needs in reach and bed alarm on.   Therapy Documentation Precautions:  Precautions Precautions: Fall Precaution Comments: L UE fistula Restrictions Weight Bearing Restrictions: Yes  Pain: Reports headache rated 5/10 - RN notified and present during session for medication administration. Otherwise, reports muscle soreness during exercises.   Therapy/Group: Individual Therapy  Tawana Scale , PT, DPT, CSRS  11/04/2020, 12:15 PM

## 2020-11-04 NOTE — Progress Notes (Signed)
[place] PHYSICAL MEDICINE & REHABILITATION PROGRESS NOTE  Subjective/Complaints: Patient seen laying in bed this morning.  She states she slept very well overnight.  She states she is ready to begin therapies. She was seen by Nephro yesterday, notes reviewed - stable.   ROS: Denies CP, SOB, N/V/D  Objective: Vital Signs: Blood pressure (!) 125/55, pulse 68, temperature (!) 96.7 F (35.9 C), resp. rate 14, weight 74.5 kg, last menstrual period 01/19/1992, SpO2 98 %. No results found. Recent Labs    11/02/20 0200 11/03/20 0110  WBC 11.3* 10.7*  HGB 8.7* 9.2*  HCT 26.8* 28.4*  PLT 203 227   Recent Labs    11/03/20 0843  NA 133*  K 3.7  CL 98  CO2 27  GLUCOSE 105*  BUN 35*  CREATININE 5.14*  CALCIUM 8.2*    Intake/Output Summary (Last 24 hours) at 11/04/2020 0903 Last data filed at 11/04/2020 0743 Gross per 24 hour  Intake 296 ml  Output --  Net 296 ml        Physical Exam: BP (!) 125/55 (BP Location: Right Arm)   Pulse 68   Temp (!) 96.7 F (35.9 C)   Resp 14   Wt 74.5 kg   LMP 01/19/1992 (Approximate)   SpO2 98%   BMI 29.09 kg/m  Constitutional: No distress . Vital signs reviewed. HENT: Normocephalic.  Atraumatic. Eyes: EOMI. No discharge. Cardiovascular: No JVD.  RRR. +Murmur. Respiratory: Normal effort.  No stridor.  Bilateral clear to auscultation. GI: Non-distended.  BS +. Skin: Warm and dry.   Right chest wall catheter.  Psych: Flat. Normal behavior. Musc: LUE with edema and tenderness Neuro: Alert Motor: RUE: 4-/5 proximal to distal LUE: 3-/5 proximal to distal B/l LE: HF, KE, ADF 3-/5  Assessment/Plan: 1. Functional deficits which require 3+ hours per day of interdisciplinary therapy in a comprehensive inpatient rehab setting.  Physiatrist is providing close team supervision and 24 hour management of active medical problems listed below.  Physiatrist and rehab team continue to assess barriers to discharge/monitor patient progress  toward functional and medical goals   Care Tool:  Bathing              Bathing assist       Upper Body Dressing/Undressing Upper body dressing   What is the patient wearing?: Hospital gown only    Upper body assist Assist Level: Moderate Assistance - Patient 50 - 74%    Lower Body Dressing/Undressing Lower body dressing      What is the patient wearing?: Hospital gown only     Lower body assist Assist for lower body dressing: Maximal Assistance - Patient 25 - 49%     Toileting Toileting Toileting Activity did not occur Landscape architect and hygiene only): Refused  Toileting assist       Transfers Chair/bed transfer  Transfers assist           Locomotion Ambulation   Ambulation assist              Walk 10 feet activity   Assist           Walk 50 feet activity   Assist           Walk 150 feet activity   Assist           Walk 10 feet on uneven surface  activity   Assist           Wheelchair     Assist  Wheelchair 50 feet with 2 turns activity    Assist            Wheelchair 150 feet activity     Assist           Medical Problem List and Plan: 1.  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 Please see conference note from today as well.  2.  Antithrombotics: -DVT/anticoagulation:  Pharmaceutical: Other (comment)--renal dose Eliquis.              -antiplatelet therapy: N/A 3. Pain Management: Oxycodone prn for severe pain. Tramadol added prn for moderate pain.              Added low dose gabapentin for neuropathic pain   Monitor with increased activity.  4. Mood: LCSW to  follow for evaluation and support.              -antipsychotic agents: N/A 5. Neuropsych: This patient is capable of making decisions on his own behalf. 6. Skin/Wound Care:  Routine pressure relief measures.  7. Fluids/Electrolytes/Nutrition: Strict I/Os with daily weights. Renal diet/1200 cc FR.  8. Hematoma LUE s/p evacuation: Continue to monitor pulse and for symptoms. Also elevate LUE 9. Acute on chronic anemia: Improved post transfusion. Continue aransep weekly.   Hb 9.2 on 5/17  Continue to monitor 10. PAF: Monitor HR tid--continue amiodarone and Eliquis.   Monitor with increased exertion 11. ESRD: HD on TTS after therapy to help with tolerance of therapy.  12. Chronic UTI: On low dose Keflex for suppression.  13. B/L foot drop- Prevalon boots for moderate B/L foot drop 14. Leukocytosis  WBCs 10.7 on 5/17  Afebrile  Cont ro monitor  LOS: 1 days A FACE TO FACE EVALUATION WAS PERFORMED  Mikhaila Roh [name] 11/04/2020, 9:03 AM

## 2020-11-04 NOTE — Progress Notes (Signed)
Physical Therapy Assessment and Plan  Patient Details  Name: [name] MRN: [id number] Date of Birth: 09/07/1945  PT Diagnosis: Abnormal posture, Abnormality of gait, Muscle weakness and Pain in L arm Rehab Potential: Good ELOS: 3-3.5 weeks   Today's Date: 11/04/2020 PT Individual Time: 0830-0930 PT Individual Time Calculation (min): 60 min    Hospital Problem: Principal Problem:   Intensive care (ICU) myopathy Active Problems:   Debility   ESRD on dialysis ([place])   Acute on chronic anemia   Post-operative pain   Past Medical History:  Past Medical History:  Diagnosis Date  . Arthritis of left knee   . Cardiomyopathy ([place])    a. h/o LV dysfunction EF 20-25% in 2013 due to sepsis.>> improved to normal   . Chronic diastolic CHF (congestive heart failure) ([place])    10/ 2013 in setting of septic shock  . Complication of anesthesia    use a little anesthesia , per patient MD states she quit breathing (2016); hard to wake up  . ESRD (end stage renal disease) ([place])    dialysis Tues, Thurs, Sat [address], sees dr deterding   . GERD (gastroesophageal reflux disease)   . Glaucoma    both eyes  . H/O hiatal hernia    a. CT 2017: large gastric hiatal hernia.  Marland Kitchen Headache(784.0)    migraine hx of  . History of blood transfusion 04/13/2015   . History of echocardiogram    a. Echo 6/17: EF 60-65%, normal wall motion, mild MR, atrial septal lipomatous hypertrophy, PASP 34 mmHg, possible trivial free-flowing pericardial effusion along RV free wall // b. Echo 5/17: Mild LVH, EF 55-60%, normal wall motion, grade 1 diastolic dysfunction, trivial MR, severe LAE, mild RAE, PASP 42 mmHg  . History of kidney stones    10/18/2019: per patient "has a couple currently one in each kidney"  . History of nephrostomy 04/11/2015   currently inplace 04/28/2015  removed now  . History of nuclear stress test    a. Myoview 1/14 - Marked ischemia in the basal anterior, mid anterior, apical septal and  apical inferior regions, EF 63% >> LHC normal   . Hyperlipidemia   . Hypertension    medication removed from regimen due to low blood pressure   . Iron deficiency    hx  . Myocardial infarction [facility]) 2013   10/18/2019: per patient "in 2013)  . Nephrolithiasis 2002, 2006   bilateral  . Normal coronary arteries 2014   a. LHC in 1/14: normal coronary arteries  . PAF (paroxysmal atrial fibrillation) ([place])    a. 10/ 2013  in setting of Septic Shock //  b. recurrent during admit for pneumonia, L effusion >> placed on Amiodarone // Coumadin for anticoagulation  . Pneumonia jan 2018, last tme lungs clear now   dx 10-06-2014 per CXR--  on 10-27-2014 pt states finished antibiotic and denies cough or fever  . Primary localized osteoarthritis of right hip 10/22/2019  . S/P hemodialysis catheter insertion ([place]) 04/11/2015    right anterior chest , only used once   . Sigmoid diverticulosis   . SOB (shortness of breath) on exertion    10/18/2019: per patient "get short of breath with activity sometimes due to heart valve"  . UTI (urinary tract infection) 05/10/2016   Past Surgical History:  Past Surgical History:  Procedure Laterality Date  . AV FISTULA PLACEMENT Left 06/02/2015   Procedure: BRACHIOCEPHALIC ARTERIOVENOUS (AV) FISTULA CREATION ;  Surgeon: Conrad Stevinson, MD;  [facility]: [facility];  Service: Vascular;  Laterality: Left;  . BASCILIC VEIN TRANSPOSITION Left 07/27/2015   Procedure: FIRST STAGE BASILIC VEIN TRANSPOSITION LEFT UPPER ARM;  Surgeon: Conrad Spiceland, MD;  Location: [place];  Service: Vascular;  Laterality: Left;  . BASCILIC VEIN TRANSPOSITION Left 09/2015   second phase  . BASCILIC VEIN TRANSPOSITION Left 10/12/2015   Procedure: SECOND STAGE BASILIC VEIN TRANSPOSITION LEFT ARM;  Surgeon: Conrad Andover, MD;  Location: [place];  Service: Vascular;  Laterality: Left;  . BREAST BIOPSY Left 08/23/07   benign fibrocystic with duct ectasia  . CARDIAC CATHETERIZATION  07-11-2012  dr Irish Lack   Abnormal  stress test/   normal coronary arteries/  LVEDP  65mHg  . CARDIOVASCULAR STRESS TEST  06-26-2012  dr vIrish Lack  marked ischemia in the basal anterior, mid anterior, apical inferior regions/  normal LVF, ef 63%  . CATARACT EXTRACTION W/ INTRAOCULAR LENS  IMPLANT, BILATERAL    . COLONOSCOPY WITH PROPOFOL N/A 10/17/2016   Procedure: COLONOSCOPY WITH PROPOFOL;  Surgeon: MGarlan Fair MD;  [facility]: [facility];  Service: Endoscopy;  Laterality: N/A;  . CYSTOSCOPY W/ URETERAL STENT PLACEMENT  04/04/2012   Procedure: CYSTOSCOPY WITH RETROGRADE PYELOGRAM/URETERAL STENT PLACEMENT;  Surgeon: SAilene Rud MD;  [place] [facility]  Service: Urology;  Laterality: Left;  . CYSTOSCOPY W/ URETERAL STENT PLACEMENT Bilateral 05/04/2015   Procedure: CYSTOSCOPY WITH BILATERAL RETROGRADE PYELOGRAM/ WITH INTERPRETATION, EXCHANGE OF RIGHT URETERAL STENT REPLACEMENT AND PLACEMENT LEFT URETERAL STENT PLACEMENT EXAMINATION OF VAGINA;  Surgeon: SCarolan Clines MD;  [facility];  Service: Urology;  Laterality: Bilateral;  . CYSTOSCOPY WITH STENT PLACEMENT Right 10/28/2014   Procedure: RIGHT URETERAL STENT PLACEMENT;  Surgeon: JIrine Seal MD;  [facility]  Service: Urology;  Laterality: Right;  . CYSTOSCOPY WITH STENT PLACEMENT Right 02/26/2015   Procedure: CYSTOSCOPY RETROGRADE PYELOGRAM WITH STENT PLACEMENT;  Surgeon: PCleon Gustin MD;  [facility];  Service: Urology;  Laterality: Right;  . CYSTOSCOPY/RETROGRADE/URETEROSCOPY/STONE EXTRACTION WITH BASKET Right 11/21/2014   Procedure: CYSTOSCOPY/RIGHT RETROGRADE PYELOGRAM/RIGHT URETEROSCOPY/BASKET EXTRACTION/RIGHT PYELOSCOPY/LASER OF STONE/RIGHT DOUBLE J STENT;  Surgeon: SCarolan Clines MD;  [place]  Service: Urology;  Laterality: Right;  . ESOPHAGOGASTRODUODENOSCOPY (EGD) WITH PROPOFOL Left 08/25/2018   Procedure: ESOPHAGOGASTRODUODENOSCOPY (EGD) WITH PROPOFOL;  Surgeon: BRonald Lobo MD;   [place]  Service: Endoscopy;  Laterality: Left;  . EXTRACORPOREAL SHOCK WAVE LITHOTRIPSY  05-28-2012  &  10-08-2012  . HEMATOMA EVACUATION Left 10/27/2020   Procedure: EVACUATION HEMATOMA, repair of Pseudoaneurysm;  Surgeon: CWaynetta Sandy MD;  [place]  Service: Vascular;  Laterality: Left;  . HIP ARTHROPLASTY Left 06/09/2020   Procedure: ARTHROPLASTY BIPOLAR HIP (HEMIARTHROPLASTY);  Surgeon: LMarchia Bond MD;  [place]  Service: Orthopedics;  Laterality: Left;  . HOLMIUM LASER APPLICATION Right 69/09/8014  Procedure: HOLMIUM LASER APPLICATION;  Surgeon: SCarolan Clines MD;  [facility]  Service: Urology;  Laterality: Right;  . IR GENERIC HISTORICAL  02/01/2016   IR NEPHROSTOMY EXCHANGE RIGHT 02/01/2016 MGreggory Keen MD [facility]-INTER[facility] RAD  . IR GENERIC HISTORICAL  02/24/2016   IR PATIENT EVAL TECH 0-60 MINS 02/24/2016 GAletta Edouard MD [facility]  . KNEE ARTHROSCOPY Left 02-14-2003  . LAPAROSCOPIC CHOLECYSTECTOMY  03-23-2005  . TOTAL ABDOMINAL HYSTERECTOMY W/ BILATERAL SALPINGOOPHORECTOMY  1993   secondary to fibroids  . TOTAL KNEE ARTHROPLASTY Left 10/22/2019  . TOTAL KNEE ARTHROPLASTY Left 10/22/2019   Procedure: TOTAL KNEE ARTHROPLASTY;  Surgeon:  Marchia Bond, MD;  Location: [place];  Service: Orthopedics;  Laterality: Left;  . TRANSTHORACIC ECHOCARDIOGRAM  04-09-2012   normal LVF,  ef 60-65%,  mild LAE,  mild TR, trivial MR and PR    Assessment & Plan Clinical Impression:[name] is a 75 year old female with history of ESRD- HD TTS, HTN, PAF, glaucoma, OA,  Aortic valve stenosis with DOE, prior CIR stay 2013 for debility post septic shock/encephalopathy, left hip fracture 12/21; who was admitted via ED on 10/26/20 with LUE pain, swelling and numbness with reports of  having infiltrated her AVF 05/03 but able complete HD on 05/07 after brief rest.  She was found to have large intramuscular biceps hematoma with  concerns of compartment syndrome and was taken to OR on 10/27/20 for evacuation of hematoma and primary repair of left brachial artery pseudoaneurysm by Dr. Donzetta Matters. Post procedure, numbness almost resolved with +thrill noted in fistula. Acute on chronic anemia treated with two total units of PRBC and weekly aranesp added today.  HD ongoing and she continues to be limited by fatigue, nausea and acute on chronic diarrhea. Therapy ongoing and patient noted to have deficits due to LUE pain as well as  BLE weakness with instability. CIR recommended due to functional decline.  Patient transferred to CIR on 11/03/2020 .   Patient currently requires total with mobility secondary to muscle weakness and decreased sitting balance, decreased standing balance, decreased postural control and decreased balance strategies.  Prior to hospitalization, patient was min with mobility and lived with Daughter in a House home.  Home access is 2Stairs to enter.  Patient will benefit from skilled PT intervention to maximize safe functional mobility, minimize fall risk and decrease caregiver burden for planned discharge home with 24 hour assist.  Anticipate patient will benefit from follow up [facility] at discharge.  PT - End of Session Activity Tolerance: Decreased this session;Tolerates 30+ min activity with multiple rests Endurance Deficit: Yes Endurance Deficit Description: needs rest breaks during mobility evaluation, can only stand for about 30 seconds PT Assessment Rehab Potential (ACUTE/IP ONLY): Good PT Barriers to Discharge: Incontinence;Hemodialysis;Weight bearing restrictions PT Patient demonstrates impairments in the following area(s): Balance;Pain;Motor;Endurance;Sensory PT Transfers Functional Problem(s): Bed Mobility;Bed to Chair;Car PT Locomotion Functional Problem(s): Ambulation;Wheelchair Mobility PT Plan PT Intensity: Minimum of 1-2 x/day ,45 to 90 minutes PT Frequency: 5 out of 7 days PT Duration Estimated Length  of Stay: 3-3.5 weeks PT Treatment/Interventions: Ambulation/gait training;DME/adaptive equipment instruction;Neuromuscular re-education;Psychosocial support;UE/LE Strength taining/ROM;Wheelchair propulsion/positioning;UE/LE Coordination activities;Therapeutic Activities;Skin care/wound management;Pain management;Functional electrical stimulation;Discharge planning;Balance/vestibular training;Functional mobility training;Therapeutic Exercise;Patient/family education;Splinting/orthotics PT Transfers Anticipated Outcome(s): CGA PT Locomotion Anticipated Outcome(s): min A short distance ambulation, S household w/c mobility PT Recommendation Follow Up Recommendations: Home health PT;24 hour supervision/assistance Patient destination: Home Equipment Recommended: To be determined   PT Evaluation Precautions/Restrictions Precautions Precautions: Fall Precaution Comments: L UE fistula Restrictions Weight Bearing Restrictions: Yes General   Vital Signs   Orthostatic VS for the past 24 hrs (Last 3 readings):  BP- Lying Pulse- Lying BP- Sitting Pulse- Sitting  11/04/20 0855 (P) 143/53 (P) 64 (P) 169/62 (P) 66   Pain Pain Assessment Pain Scale: 0-10 Pain Score: 6  Pain Type: Surgical pain Pain Location: Arm Pain Orientation: Left Pain Descriptors / Indicators: Aching Pain Onset: On-going Pain Intervention(s): Rest;Repositioned Home Living/Prior Functioning Home Living Living Arrangements: Children Available Help at Discharge: Family;Available 24 hours/day (daughter is a Designer, industrial/product so at times is "in and out") Type of Home: House Home Access: Stairs  to enter Entrance Stairs-Number of Steps: 2 Entrance Stairs-Rails: None Home Layout: One level Bathroom Shower/Tub: Tub/shower unit (Sponge bathes) Bathroom Toilet: Handicapped height Bathroom Accessibility: No (has to side step with walker)  Lives With: Daughter Prior Function Level of Independence: Requires assistive device for  independence;Needs assistance with tranfers;Needs assistance with ADLs;Needs assistance with gait (daughter bumps wheelchair up/down steps for home entry)  Able to Take Stairs?: No Driving: No Vocation: Retired  Associate Professor Overall Cognitive Status: Within Functional Limits for tasks assessed Arousal/Alertness: Awake/alert Orientation Level: Oriented X4 Memory: Appears intact Sensation Sensation Light Touch: Impaired Detail Light Touch Impaired Details: Impaired LUE Additional Comments: numbness/tingling L hand Coordination Gross Motor Movements are Fluid and Coordinated: No Fine Motor Movements are Fluid and Coordinated: No Coordination and Movement Description: Limited due to weakness Motor  Motor Motor: Abnormal postural alignment and control Motor - Skilled Clinical Observations: Generalized weakness, limited sitting balance/standing balance with assist needed   Trunk/Postural Assessment  Cervical Assessment Cervical Assessment: Exceptions to [facility] (forward head) Thoracic Assessment Thoracic Assessment: Exceptions to [facility] (rounded shoulders) Lumbar Assessment Lumbar Assessment: Exceptions to [facility] (posterior pelvic tilt) Postural Control Postural Control: Deficits on evaluation Postural Limitations: weak core, difficulty with sitting balance  Balance Balance Balance Assessed: Yes Static Sitting Balance Static Sitting - Balance Support: Feet supported;Left upper extremity supported Static Sitting - Level of Assistance: 5: Stand by assistance Static Sitting - Comment/# of Minutes: inital assist for getting forward into position for balance Dynamic Sitting Balance Dynamic Sitting - Balance Support: Feet supported;Left upper extremity supported Dynamic Sitting - Level of Assistance: 3: Mod assist Sitting balance - Comments: assist for leaning to place slide board Static Standing Balance Static Standing - Level of Assistance: 3: Mod assist Static Standing - Comment/# of  Minutes: UE support on bars in paralell bars Extremity Assessment      RLE Assessment RLE Assessment: Exceptions to [facility] Passive Range of Motion (PROM) Comments: WFL Active Range of Motion (AROM) Comments: Limited due to weakness General Strength Comments: Hip flexion 2/5, knee extension 3+/5, ankle DF 4/5 LLE Assessment LLE Assessment: Exceptions to [facility] Passive Range of Motion (PROM) Comments: WFL (scar on L knee from TKA last year, pt reports L hip replacement in Dec '21) Active Range of Motion (AROM) Comments: limited due to weakness General Strength Comments: hip flexion 2+/5, knee extension 3+/5, ankle DF 3+/5.  Care Tool Care Tool Bed Mobility Roll left and right activity   Roll left and right assist level: Moderate Assistance - Patient 50 - 74%    Sit to lying activity   Sit to lying assist level: Maximal Assistance - Patient 25 - 49%    Lying to sitting edge of bed activity   Lying to sitting edge of bed assist level: Moderate Assistance - Patient 50 - 74%     Care Tool Transfers Sit to stand transfer   Sit to stand assist level: 2 Helpers    Chair/bed transfer   Chair/bed transfer assist level: Maximal Assistance - Patient 25 - 49%     Psychologist, counselling transfer activity did not occur: Safety/medical concerns        Care Tool Locomotion Ambulation Ambulation activity did not occur: Safety/medical concerns        Walk 10 feet activity Walk 10 feet activity did not occur: Safety/medical concerns       Walk 50 feet with 2 turns activity Walk 50 feet with 2 turns activity  did not occur: Safety/medical concerns      Walk 150 feet activity Walk 150 feet activity did not occur: Safety/medical concerns      Walk 10 feet on uneven surfaces activity Walk 10 feet on uneven surfaces activity did not occur: Safety/medical concerns      Stairs Stair activity did not occur: Safety/medical concerns        Walk up/down 1 step activity Walk  up/down 1 step or curb (drop down) activity did not occur: Safety/medical concerns     Walk up/down 4 steps activity did not occuR: Safety/medical concerns  Walk up/down 4 steps activity      Walk up/down 12 steps activity Walk up/down 12 steps activity did not occur: Safety/medical concerns      Pick up small objects from floor Pick up small object from the floor (from standing position) activity did not occur: Safety/medical concerns      Wheelchair Will patient use wheelchair at discharge?: Yes Type of Wheelchair: Manual   Wheelchair assist level: Dependent - Patient 0%    Wheel 50 feet with 2 turns activity   Assist Level: Dependent - Patient 0%  Wheel 150 feet activity   Assist Level: Dependent - Patient 0%    Refer to Care Plan for Long Term Goals  SHORT TERM GOAL WEEK 1 PT Short Term Goal 1 (Week 1): Patient will perform supine to sit with min A and mod cues. PT Short Term Goal 2 (Week 1): Patient will demonstrate sitting balance with S during simple functional task. PT Short Term Goal 3 (Week 1): Patient will perform sit to stand with mod A at least 50% of attempts. PT Short Term Goal 4 (Week 1): Patient will initiate ambulation.  Recommendations for other services: None   Skilled Therapeutic Intervention Patient in supine and evaluation procedures explained.  Patient had w/c in the room but obtained roho cushion and gown for pt.  In bed for stregth/ROM assessment, then performed rolling R with mod A and up to EOB with mod A for guiding LE's to EOB and lifting trunk, then scooting hips to EOB.  Patient initially min A for sitting balance, then, once positioned for comfort with pillows under L arm pt able to sit with S.  Performed squat pivot bed > w/c with max A.  Patient unable to turn feet and so assisted for scooting to L and back in recliner.  PAtient assisted in w/c to therapy gym.  In parallel bars performed sit <> stand x 2 with +2 A for lifting and lowering help and  in standing first bout performed about 30-40 seconds of lateral weight shifts till R knee began to buckle.  Also performed seated activity at hi-lo table grasping foam squeeze block (yellow) x 10 in L hand.  Patient assisted to room in w/c.  Performed slide board transfer to bed with +2 A (NT in to help) with cues and assist for anterior weight shift.  PAtient sit to supine with max A for LE's and to lower trunk.  Positioned for comfort with pillow under L arm and call bell in reach, bed alarm active.  Mobility Bed Mobility Bed Mobility: Rolling Right;Supine to Sit;Sit to Supine Rolling Right: Moderate Assistance - Patient 50-74% Supine to Sit: Moderate Assistance - Patient 50-74% Sit to Supine: 2 Helpers Transfers Transfers: Sit to Stand Sit to Stand: 2 Helpers Transfer (Assistive device): Other (Comment) (parallel bars) Locomotion  Gait Ambulation: No Wheelchair Mobility Wheelchair Mobility: No (due to painful  L UE and feet not reaching floor)   Discharge Criteria: Patient will be discharged from PT if patient refuses treatment 3 consecutive times without medical reason, if treatment goals not met, if there is a change in medical status, if patient makes no progress towards goals or if patient is discharged from hospital.  The above assessment, treatment plan, treatment alternatives and goals were discussed and mutually agreed upon: by patient  Jamison Oka, PT 11/04/2020, 12:12 PM

## 2020-11-04 NOTE — IPOC Note (Signed)
Individualized overall Plan of Care (IPOC) Patient Details Name: [name] MRN: [id number] DOB: 1945/11/06  Admitting Diagnosis: Intensive care (ICU) myopathy  Hospital Problems: Principal Problem:   Intensive care (ICU) myopathy Active Problems:   Debility   ESRD on dialysis ([facility])   Acute on chronic anemia   Post-operative pain     Functional Problem List: Nursing Bowel,Endurance,Medication Management,Safety,Pain,Skin Integrity  PT Balance,Pain,Motor,Endurance,Sensory  OT Balance,Endurance,Motor,Pain,Sensory  SLP    TR         Basic ADL's: OT Grooming,Bathing,Dressing,Toileting,Eating     Advanced  ADL's: OT       Transfers: PT Bed Mobility,Bed to Chair,Car  OT Toilet,Tub/Shower     Locomotion: PT Ambulation,Wheelchair Mobility     Additional Impairments: OT Fuctional Use of Upper Extremity  SLP        TR      Anticipated Outcomes Item Anticipated Outcome  Self Feeding independent  Swallowing      Basic self-care  min assist  Toileting  min assist   Bathroom Transfers min assist  Bowel/Bladder  manage bowel with mod I assist  Transfers  CGA  Locomotion  min A short distance ambulation, S household w/c mobility  Communication     Cognition     Pain  at or below level 4  Safety/Judgment  maintain safety with cues/reminders   Therapy Plan: PT Intensity: Minimum of 1-2 x/day ,45 to 90 minutes PT Frequency: 5 out of 7 days PT Duration Estimated Length of Stay: 3-3.5 weeks OT Intensity: Minimum of 1-2 x/day, 45 to 90 minutes OT Frequency: 5 out of 7 days OT Duration/Estimated Length of Stay: 17-20 days      Team Interventions: Nursing Interventions Patient/Family Education,Bowel Management,Disease Management/Prevention,Medication Management,Discharge Planning,Skin Care/Wound Management,Pain Management  PT interventions Ambulation/gait training,DME/adaptive equipment instruction,Neuromuscular re-education,Psychosocial support,UE/LE  Strength taining/ROM,Wheelchair propulsion/positioning,UE/LE Coordination activities,Therapeutic Activities,Skin care/wound management,Pain management,Functional electrical stimulation,Discharge planning,Balance/vestibular training,Functional mobility training,Therapeutic Exercise,Patient/family education,Splinting/orthotics  OT Interventions Balance/vestibular training,Community reintegration,DME/adaptive equipment instruction,Discharge planning,Pain management,Self Care/advanced ADL retraining,Patient/family education,Functional mobility training,Neuromuscular re-education,Splinting/orthotics,UE/LE Strength taining/ROM,Therapeutic Exercise,Therapeutic Activities,UE/LE Coordination activities,Wheelchair propulsion/positioning  SLP Interventions    TR Interventions    SW/CM Interventions Discharge Planning,Psychosocial Support,Patient/Family Education   Barriers to Discharge MD  Medical stability  Nursing Decreased caregiver support,Home environment access/layout,Wound Care,Hemodialysis,Weight bearing restrictions 1 level 2 ste no rails; needs to get down step to w/c for scat bus  PT Incontinence,Hemodialysis,Weight bearing restrictions    OT      SLP      SW Insurance for SNF coverage     Team Discharge Planning: Destination: PT-Home ,OT- Home , SLP-  Projected Follow-up: PT-Home health PT,24 hour supervision/assistance, OT-  Home health OT,24 hour supervision/assistance, SLP-  Projected Equipment Needs: PT-To be determined, OT- To be determined, SLP-  Equipment Details: PT- , OT-  Patient/family involved in discharge planning: PT- Patient,  OT-Patient, SLP-   MD ELOS: 18-22 days. Medical Rehab Prognosis:  Good Assessment:  [name] is a 75 year old female with history of ESRD- HD TTS, HTN, PAF, glaucoma, OA,  Aortic valve stenosis with DOE, prior CIR stay 2013 for debility post septic shock/encephalopathy, left hip fracture 12/21; who was admitted via ED on 10/26/20 with LUE  pain, swelling and numbness with reports of  having infiltrated her AVF 05/03 but able complete HD on 05/07 after brief rest.  She was found to have large intramuscular biceps hematoma with concerns of compartment syndrome and was taken to OR on 10/27/20 for evacuation of hematoma and primary repair of  left brachial artery pseudoaneurysm by Dr. Donzetta Matters. Post procedure, numbness almost resolved with +thrill noted in fistula. Acute on chronic anemia treated with two total units of PRBC and weekly aranesp added.  HD ongoing and she continues to be limited by fatigue, nausea and acute on chronic diarrhea. Therapy ongoing and patient noted to have deficits due to LUE pain as well as  BLE weakness with instability.  We will set goals for min a with PT/OT.  Due to the current state of emergency, patients may not be receiving their 3-hours of Medicare-mandated therapy.  See Team Conference Notes for weekly updates to the plan of care

## 2020-11-05 MED ORDER — HEPARIN SODIUM (PORCINE) 1000 UNIT/ML IJ SOLN
3200.0000 [IU] | INTRAMUSCULAR | Status: DC | PRN
  Administered 2020-11-21: 3200 [IU]

## 2020-11-05 MED ORDER — LOPERAMIDE HCL 2 MG PO CAPS
ORAL_CAPSULE | ORAL | Status: AC
  Filled 2020-11-05: qty 1

## 2020-11-05 MED ORDER — DARBEPOETIN ALFA 100 MCG/0.5ML IJ SOSY
PREFILLED_SYRINGE | INTRAMUSCULAR | Status: AC
  Administered 2020-11-05: 100 ug via INTRAVENOUS
  Filled 2020-11-05: qty 0.5

## 2020-11-05 MED ORDER — ALBUMIN HUMAN 25 % IV SOLN
INTRAVENOUS | Status: AC
  Administered 2020-11-05: 25 g via INTRAVASCULAR
  Filled 2020-11-05: qty 100

## 2020-11-05 MED ORDER — HEPARIN SODIUM (PORCINE) 1000 UNIT/ML IJ SOLN
INTRAMUSCULAR | Status: AC
  Administered 2020-11-05: 1700 [IU]
  Filled 2020-11-05: qty 4

## 2020-11-05 NOTE — Progress Notes (Signed)
Occupational Therapy Session Note  Patient Details  Name: [name] MRN: [id number] Date of Birth: [birth year]  Today's Date: 11/05/2020 OT Individual Time: 0945-1100 OT Individual Time Calculation (min): 75 min    Short Term Goals: Week 1:  OT Short Term Goal 1 (Week 1): Pt will complete UB dressing and bathing with supervision EOB or from wheelchair. OT Short Term Goal 2 (Week 1): Pt will complete LB bathing with max assist sit to stand for two consecutive sessions. OT Short Term Goal 3 (Week 1): Pt will donn pants with max assist sit to stand OT Short Term Goal 4 (Week 1): Pt will complete stand pivot transfer to the 3:1 with mod assist using the RW for support. OT Short Term Goal 5 (Week 1): Pt will use the LUE to wash the RUE with supervision and min instructional cueing for initiation.  Skilled Therapeutic Interventions/Progress Updates:    Pt received seated in w/c finishing PT session and agreeable to OT session. Pt reporting incontinent of bowels and needing to be changed. W/c >bed using Sara+. Sit>sup Mod A using bedrails. Rolling min-mod A and scooting to HOB mod A using bed rails. Vc's for body mechanics and hand placement. Total A for peri hygiene, wiping buttocks (with increased time for thoroughness due to continuation of BM), and clothing management supine.  Pt declining bedpan and left seated on brief in slight chair position in bed to attempt completing BM. Close spvsn sitting with back away from bed using bed rail for anterior pull. Min A UB dressing/bathing. Dependent to don/doff socks. Pt engaged in LUE conditioning exercises targeting forearm supination/pronation and wrist FLX/EXT internal/external rotation in 90 degrees abduction, abduction/adduction, all 10-20 reps each; decreased AROM noted with reported decreased sensation in hand/wrist and unrated pain in antecubital region. Engaged in further BUE conditioning exercises including shoulder FLX at 10 reps each. RN  notified of weeping from LUE wound and changed during session. Pt requested cotton gown for comfort. Pt's BUEs positioned with pillows and egg crate; education on importance of continued movement in between sessions. Pt requested SCD's donned. Pt left semifowlers in bed, call bell in reach, alarm set, and all needs met.  Therapy Documentation Precautions:  Precautions Precautions: Fall Precaution Comments: L UE fistula with upper arm wound and pain Restrictions Weight Bearing Restrictions: No  Therapy/Group: Individual Therapy  Carlos Levering 11/05/2020, 7:33 AM

## 2020-11-05 NOTE — Progress Notes (Signed)
[facility] Progress Note   Subjective:   Patient seen and examined at bedside in dialysis.  Tolerating rehab well so far.  No new complaints.   Objective Vitals:   11/04/20 0533 11/04/20 1314 11/04/20 1955 11/05/20 0334  BP: (!) 125/55 140/61 (!) 140/53 (!) 135/58  Pulse: 68 67 70 71  Resp: 14 16 18 18   Temp: (!) 96.7 F (35.9 C) (!) 97.5 F (36.4 C) 98 F (36.7 C) 97.7 F (36.5 C)  TempSrc:  Oral  Oral  SpO2: 98% 97% 100% 99%  Weight:       Physical Exam General:chronically ill appearing female in NAD Heart:RRR, +0/3 systolic murmur Lungs:mostly CTAB anteriorly  Abdomen:NTND Extremities:1+ LE edema Dialysis Access: TDC in use, LU AVF +b/t bruised/edema - improving   Filed Weights   11/03/20 1201  Weight: 74.5 kg    Intake/Output Summary (Last 24 hours) at 11/05/2020 1326 Last data filed at 11/05/2020 1300 Gross per 24 hour  Intake 480 ml  Output 5 ml  Net 475 ml    Additional Objective Labs: Basic Metabolic Panel: Recent Labs  Lab 10/31/20 1250 11/03/20 0843  NA 133* 133*  K 3.6 3.7  CL 99 98  CO2 30 27  GLUCOSE 79 105*  BUN 23 35*  CREATININE 3.91* 5.14*  CALCIUM 8.0* 8.2*  PHOS 2.2* 3.0   Liver Function Tests: Recent Labs  Lab 10/31/20 1250 11/03/20 0843  ALBUMIN 1.5* 1.5*   CBC: Recent Labs  Lab 10/31/20 0402 11/01/20 0132 11/02/20 0200 11/03/20 0110  WBC 10.1 9.4 11.3* 10.7*  NEUTROABS 8.0* 7.6 9.3* 8.7*  HGB 8.7* 8.5* 8.7* 9.2*  HCT 26.4* 25.9* 26.8* 28.4*  MCV 90.1 90.9 92.7 93.1  PLT 222 197 203 227    Medications:  . amiodarone  200 mg Oral Daily  . apixaban  2.5 mg Oral BID  . cephALEXin  250 mg Oral q1800  . Chlorhexidine Gluconate Cloth  6 each Topical Q0600  . darbepoetin (ARANESP) injection - DIALYSIS  100 mcg Intravenous Q Thu-HD  . famotidine  20 mg Oral Daily  . feeding supplement (NEPRO CARB STEADY)  237 mL Oral BID BM  . gabapentin  100 mg Oral QHS  . latanoprost  1 drop Both Eyes QHS  .  lidocaine-prilocaine  1 application Topical Once per day on Mon Wed Fri  . multivitamin  1 tablet Oral QHS  . saccharomyces boulardii  250 mg Oral BID  . timolol  1 drop Both Eyes Daily    Dialysis Orders: [facility] TTS  4h 400/500 71kg 2/2 bathP2 Hep none RIJ TDC (AVF works butinfiltrated and not using) -Mircera 75 q 2 wks (last 5/5) No VDRA   Assessment/Plan: 1. LUEcompartment syndrome:evacuation of hematoma/ repair of bleeding brachial artery pseudoaneurysm on 5/10 per Dr. Cain/VVS. Pain control per PMD. 2. ESRD - HD TTS. HD today per regular schedule. Labs prior. 3. Hypertension/volume - BPvariable but mostly wellcontrolled. No BP meds on home list.Increased volume on exam, per weights 2-3kg over dry, had been lowering as OP as patient had been losing weight.Continue to titrate down as tolerated.  4. Anemia of CKD/ABLA- Hgb 6.5 at lowest here post-op.s/p2 unit prbcs 5/11. Recent ESA dose as outpatientbut will be due again on Thursday 5/19, will order.last Hgb^9.2.labs prior to HD. 5. Metabolic bone disease -CCa 10, phosimproved.No binders or VDRA.Encourage nutrition. 6. Chronic combined CHF -- volume removal with UF on HD  7. PAFib- On Eliquis/amiodarone 8. Nutrition -Renal diet/fluid restriction.Encourage nutrition.Prot supp  for low albumin 9. Debility - now in CIR.   Jen Mow, PA-C [facility] 11/05/2020,1:26 PM  LOS: 2 days

## 2020-11-05 NOTE — Progress Notes (Signed)
[place] PHYSICAL MEDICINE & REHABILITATION PROGRESS NOTE  Subjective/Complaints: Patient seen laying in bed this morning.  She states she slept well overnight.  She states she had a good first day of therapies yesterday.  ROS: Denies CP, SOB, N/V/D  Objective: Vital Signs: Blood pressure (!) 135/58, pulse 71, temperature 97.7 F (36.5 C), temperature source Oral, resp. rate 18, weight 74.5 kg, last menstrual period 01/19/1992, SpO2 99 %. No results found. Recent Labs    11/03/20 0110  WBC 10.7*  HGB 9.2*  HCT 28.4*  PLT 227   Recent Labs    11/03/20 0843  NA 133*  K 3.7  CL 98  CO2 27  GLUCOSE 105*  BUN 35*  CREATININE 5.14*  CALCIUM 8.2*    Intake/Output Summary (Last 24 hours) at 11/05/2020 1255 Last data filed at 11/05/2020 0840 Gross per 24 hour  Intake 510 ml  Output 5 ml  Net 505 ml        Physical Exam: BP (!) 135/58 (BP Location: Right Arm)   Pulse 71   Temp 97.7 F (36.5 C) (Oral)   Resp 18   Wt 74.5 kg   LMP 01/19/1992 (Approximate)   SpO2 99%   BMI 29.09 kg/m  Constitutional: No distress . Vital signs reviewed. HENT: Normocephalic.  Atraumatic. Eyes: EOMI. No discharge. Cardiovascular: No JVD.  RRR.  + Murmur Respiratory: Normal effort.  No stridor.  Bilateral clear to auscultation. GI: Non-distended.  BS +. Skin: Warm and dry.   Right chest wall catheter. Psych: Flat.  Normal behavior. Musc: LUE with edema and tenderness, stable Neuro: Alert Motor: RUE: 4-/5 proximal to distal LUE: 3-/5 proximal to distal pain inhibition B/l LE: HF, KE, ADF 3-/5, unchanged  Assessment/Plan: 1. Functional deficits which require 3+ hours per day of interdisciplinary therapy in a comprehensive inpatient rehab setting.  Physiatrist is providing close team supervision and 24 hour management of active medical problems listed below.  Physiatrist and rehab team continue to assess barriers to discharge/monitor patient progress toward functional and  medical goals   Care Tool:  Bathing    Body parts bathed by patient: Left arm,Abdomen,Chest,Right upper leg,Left upper leg   Body parts bathed by helper: Front perineal area,Buttocks,Right lower leg,Left lower leg     Bathing assist Assist Level: Moderate Assistance - Patient 50 - 74%     Upper Body Dressing/Undressing Upper body dressing   What is the patient wearing?: Pull over shirt    Upper body assist Assist Level: Minimal Assistance - Patient > 75%    Lower Body Dressing/Undressing Lower body dressing      What is the patient wearing?: Pants     Lower body assist Assist for lower body dressing: 2 Helpers (sit to stand)     Naval architect Activity did not occur Landscape architect and hygiene only): Refused  Toileting assist Assist for toileting: Total Assistance - Patient < 25%     Transfers Chair/bed transfer  Transfers assist     Chair/bed transfer assist level: Moderate Assistance - Patient 50 - 74% Chair/bed transfer assistive device: Sliding board   Locomotion Ambulation   Ambulation assist   Ambulation activity did not occur: Safety/medical concerns          Walk 10 feet activity   Assist  Walk 10 feet activity did not occur: Safety/medical concerns        Walk 50 feet activity   Assist Walk 50 feet with 2 turns activity did not occur: Safety/medical  concerns         Walk 150 feet activity   Assist Walk 150 feet activity did not occur: Safety/medical concerns         Walk 10 feet on uneven surface  activity   Assist Walk 10 feet on uneven surfaces activity did not occur: Safety/medical concerns         Wheelchair     Assist Will patient use wheelchair at discharge?: Yes Type of Wheelchair: Manual    Wheelchair assist level: Minimal Assistance - Patient > 75% Max wheelchair distance: 55ft    Wheelchair 50 feet with 2 turns activity    Assist        Assist Level: Minimal  Assistance - Patient > 75%   Wheelchair 150 feet activity     Assist      Assist Level: Dependent - Patient 0%    Medical Problem List and Plan: 1.  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  Antithrombotics: -DVT/anticoagulation:  Pharmaceutical: Other (comment)--renal dose Eliquis.              -antiplatelet therapy: N/A 3. Pain Management: Oxycodone prn for severe pain. Tramadol added prn for moderate pain.              Added low dose gabapentin for neuropathic pain   Relatively controlled with meds on 5/19  Monitor with increased activity.  4. Mood: LCSW to follow for evaluation and support.              -antipsychotic agents: N/A 5. Neuropsych: This patient is capable of making decisions on his own behalf. 6. Skin/Wound Care:  Routine pressure relief measures.  7. Fluids/Electrolytes/Nutrition: Strict I/Os with daily weights. Renal diet/1200 cc FR.  8. Hematoma LUE s/p evacuation: Continue to monitor pulse and for symptoms. Also elevate LUE 9. Acute on chronic anemia: Improved post transfusion. Continue aransep weekly.   Hb 9.2 on 5/17  Continue to monitor 10. PAF: Monitor HR tid--continue amiodarone and Eliquis.   Controlled on 5/19  Monitor with increased exertion 11.  ESRD: HD on TTS after therapy to help with tolerance of therapy.  12. Chronic UTI: On low dose Keflex for suppression.  13. B/L foot drop- Prevalon boots for moderate B/L foot drop 14. Leukocytosis  WBCs 10.7 on 5/17  Afebrile  Cont ro monitor  LOS: 2 days A FACE TO FACE EVALUATION WAS PERFORMED  Kyler Germer [name] 11/05/2020, 12:55 PM

## 2020-11-05 NOTE — Progress Notes (Signed)
Physical Therapy Session Note  Patient Details  Name: [name] MRN: [id number] Date of Birth: 04-27-1946  Today's Date: 11/05/2020 PT Individual Time:Session1: 3300-7622; Arthor Captain: [id number] PT Individual Time Calculation (min): 75 min & 54 min  Short Term Goals: Week 1:  PT Short Term Goal 1 (Week 1): Patient will perform supine to sit with min A and mod cues. PT Short Term Goal 2 (Week 1): Patient will demonstrate sitting balance with S during simple functional task. PT Short Term Goal 3 (Week 1): Patient will perform sit to stand with mod A at least 50% of attempts. PT Short Term Goal 4 (Week 1): Patient will initiate ambulation.  Skilled Therapeutic Interventions/Progress Updates:   Session1: Patient in supine and reports just got cleaned up but having diarrhea and had informed RN needed medication.  Patient requesting scrub bottoms so obtained for her.  Patient supine to sit with elevated HOB mod A to lift trunk and scoot hips, pt moving legs to EOB on her own.  Patient sitting balance with S.  Leaning over to help don pants with mod A for looping both feet into pants.  Patient sit to stand assisted with Clarise Cruz Plus to pull up pants.  Then assisted to w/c using lift as well.  Assisted in w/c to dayroom.  On Kinetron @ 55 cm/sec for 1 minute bouts x 3 for LE strength.  Performed seated therex as noted below in w/c.  Patient performed slide board transfer to mat with mod to max A and mod cues for weight shift.  Sit to supine on mat mod A for LE's.  Performed supine therex as noted below.  Supine to sit with mod A and cues.  Patient performed slide board transfer to w/c with mod to max A and cues.  Patient in w/c assisted to room.  Left up in chair with alarm belt active, call bell & needs in reach, L arm elevated on pillow.  OT made aware pt needs assist with hygiene due to diarrhea.   Session2:  Patient in supine following OT session.  OT had reported lots of drainage from L UE and RN had  re-dressed.  Patient in supine for AP's, hip abduction, SAQ w/ 5 sec hold and heel slides x 10.  Patient supine to sit with mod A.  Seated EOB to work on sitting balance while performing putty exercises with L UE on b/s table rolling into log, and pushing into it on table with each finger separately, then rolling into ball and squeezing between each individual fingers.  Patient scooting up to [facility] in sitting with mod to max A using bed rail and cues for anterior weight shift.  Patient sit to squat x 3 using bedrail on R and max A.  Sit to supine mod A for LE's and scooted up to [facility] with trendelenberg and R rail and mod A.  Patient up to chair position performed anterior lean with both hand on bed rails x 2 x 5, R UE chest press and rows with yellow t-band x 10 each.  Reported had diarrhea again.  Assisted for hygiene and brief change in supine using rail for rolling with min A.  Left in supine with call bell in & needs reach, bed alarm active.  Therapy Documentation Precautions:  Precautions Precautions: Fall Precaution Comments: L UE fistula with upper arm wound and pain Restrictions Weight Bearing Restrictions: No Pain: Pain Assessment Pain Scale: 0-10 Pain Score: 6  Pain Type: Acute pain Pain  Location: Arm Pain Orientation: Left Pain Descriptors / Indicators: Aching;Sore Pain Onset: On-going Pain Intervention(s): Repositioned;Distraction   Exercises: Total Joint Exercises Towel Squeeze: Both;10 reps;Supine (5 sec hold) Hip ABduction/ADduction: Both;10 reps;Supine (hooklying with yellow t-band) Bridges: Both;10 reps;Supine (3 sec hold with towel squeeze) Other Exercises Other Exercises: bent knee fallouts x 10 in hooklying Other Exercises: seated trunk flex/ext x 10 in chair Other Exercises: seated scap squeezes towel roll at spine x 10 w/ 5 sec hold  Therapy/Group: Individual Therapy  Reginia Naas  Magda Kiel, PT 11/05/2020, 9:33 AM

## 2020-11-06 DIAGNOSIS — N390 Urinary tract infection, site not specified: Secondary | ICD-10-CM

## 2020-11-06 DIAGNOSIS — R195 Other fecal abnormalities: Secondary | ICD-10-CM

## 2020-11-06 MED ORDER — GABAPENTIN 300 MG PO CAPS
300.0000 mg | ORAL_CAPSULE | Freq: Every day | ORAL | Status: DC
  Administered 2020-11-06 – 2020-12-01 (×24): 300 mg via ORAL
  Filled 2020-11-06 (×25): qty 1

## 2020-11-06 MED ORDER — DICLOFENAC SODIUM 1 % EX GEL
2.0000 g | Freq: Four times a day (QID) | CUTANEOUS | Status: DC
  Administered 2020-11-06 – 2020-11-26 (×20): 2 g via TOPICAL
  Filled 2020-11-06: qty 100

## 2020-11-06 MED ORDER — DIPHENOXYLATE-ATROPINE 2.5-0.025 MG/5ML PO LIQD
5.0000 mL | Freq: Four times a day (QID) | ORAL | Status: DC | PRN
  Administered 2020-11-06 – 2020-11-19 (×14): 5 mL via ORAL
  Filled 2020-11-06 (×15): qty 5

## 2020-11-06 MED ORDER — CALCIUM POLYCARBOPHIL 625 MG PO TABS
625.0000 mg | ORAL_TABLET | Freq: Two times a day (BID) | ORAL | Status: DC
  Administered 2020-11-06 – 2020-12-01 (×42): 625 mg via ORAL
  Filled 2020-11-06 (×49): qty 1

## 2020-11-06 NOTE — Progress Notes (Signed)
Physical Therapy Session Note  Patient Details  Name: [name] MRN: [id number] Date of Birth: 1946/01/26  Today's Date: 11/06/2020 PT Individual Time: 0835-0920 PT Individual Time Calculation (min): 45 min   Short Term Goals: Week 1:  PT Short Term Goal 1 (Week 1): Patient will perform supine to sit with min A and mod cues. PT Short Term Goal 2 (Week 1): Patient will demonstrate sitting balance with S during simple functional task. PT Short Term Goal 3 (Week 1): Patient will perform sit to stand with mod A at least 50% of attempts. PT Short Term Goal 4 (Week 1): Patient will initiate ambulation.  Skilled Therapeutic Interventions/Progress Updates:    Patient in supine with NT in the room finishing cleaning and applying new brief, but pt reported she felt she had more diarrhea prior to finishing applying brief, so pt rolled with min A for continued cleaning and application of another brief.  Patient supine to sit with mod A.  Donned pants with max A sitting for time management.  Patient sit to stand to RW with +2 mod A and max A to finish donning pants.  Transfer to w/c via slide board with +2 A for safety and mod A.  Patient assisted in w/c to therapy gym.  Attempted sit to stand but feet sliding so applied new slipper socks and +2 A to [place] walker for lifting help.  Patient ambulated x 8' with Harmon Pier walker and +2 for w/c follow and mod A, stopped once to cue for knee extension and tall posture as reported knees were hitting each other.  Patient assisted in w/c to room and performed slide board transfer to bed with mod A +2 for safety.  Sit to supine mod to max A for lifting legs and repositioning trunk.  Scooting hips/shoulders with min A and cues.  Doffed pants with max A in supine and rolling to L with rail and min A with total A for hygiene and changing brief.  Patient reported continued incontinence of bowel, but left in supine with call bell in reach and bed alarm active and handoff to OT  with report that still soiled.   Therapy Documentation Precautions:  Precautions Precautions: Fall Precaution Comments: L UE fistula with upper arm wound and pain Restrictions Weight Bearing Restrictions: No Pain: Pain Assessment Pain Scale: 0-10 Pain Score: 2  Faces Pain Scale: Hurts a little bit Pain Type: Acute pain Pain Location: Arm Pain Orientation: Left Pain Descriptors / Indicators: Tender;Sore Pain Onset: On-going Pain Intervention(s): Repositioned   Therapy/Group: Individual Therapy  Reginia Naas  Magda Kiel, PT 11/06/2020, 11:57 AM

## 2020-11-06 NOTE — Progress Notes (Signed)
[facility] Progress Note   Subjective: Seen in room, no C/Os. HD tomorrow on schedule.   Objective Vitals:   11/05/20 1630 11/05/20 1721 11/05/20 1937 11/06/20 0433  BP: 123/68 (!) 128/45 (!) 103/43 (!) 107/51  Pulse: 74 73 71 67  Resp: 14 17 18    Temp: 97.8 F (36.6 C) 98.1 F (36.7 C) 98.1 F (36.7 C) 98.6 F (37 C)  TempSrc: Oral Oral Oral Oral  SpO2: 100% 100% 99% 96%  Weight: 74.3 kg   74.2 kg   Physical Exam General: Pleasant elderly female in NAD Heart: T0,V7 3/6 systolic M. No R/G.  Lungs:CTAB Abdomen: Active BS, NT Extremities: 1+ LE edema Dialysis Access: [facility] with drsg CDI. L AVF +T/B guaze drsg intact.     Additional Objective Labs: Basic Metabolic Panel: Recent Labs  Lab 10/31/20 1250 11/03/20 0843  NA 133* 133*  K 3.6 3.7  CL 99 98  CO2 30 27  GLUCOSE 79 105*  BUN 23 35*  CREATININE 3.91* 5.14*  CALCIUM 8.0* 8.2*  PHOS 2.2* 3.0   Liver Function Tests: Recent Labs  Lab 10/31/20 1250 11/03/20 0843  ALBUMIN 1.5* 1.5*   No results for input(s): LIPASE, AMYLASE in the last 168 hours. CBC: Recent Labs  Lab 10/31/20 0402 11/01/20 0132 11/02/20 0200 11/03/20 0110  WBC 10.1 9.4 11.3* 10.7*  NEUTROABS 8.0* 7.6 9.3* 8.7*  HGB 8.7* 8.5* 8.7* 9.2*  HCT 26.4* 25.9* 26.8* 28.4*  MCV 90.1 90.9 92.7 93.1  PLT 222 197 203 227   Blood Culture    Component Value Date/Time   SDES URINE, RANDOM 07/17/2020 1500   SPECREQUEST  07/17/2020 1500    NONE Performed at [facility], [place] [address], [place], [place] [address]    CULT MULTIPLE SPECIES PRESENT, SUGGEST RECOLLECTION (A) 07/17/2020 1500   REPTSTATUS 07/18/2020 FINAL 07/17/2020 1500    Cardiac Enzymes: No results for input(s): CKTOTAL, CKMB, CKMBINDEX, TROPONINI in the last 168 hours. CBG: No results for input(s): GLUCAP in the last 168 hours. Iron Studies: No results for input(s): IRON, TIBC, TRANSFERRIN, FERRITIN in the last 72  hours. @lablastinr3 @ Studies/Results: No results found. Medications:  . amiodarone  200 mg Oral Daily  . apixaban  2.5 mg Oral BID  . cephALEXin  250 mg Oral q1800  . Chlorhexidine Gluconate Cloth  6 each Topical Q0600  . darbepoetin (ARANESP) injection - DIALYSIS  100 mcg Intravenous Q Thu-HD  . famotidine  20 mg Oral Daily  . feeding supplement (NEPRO CARB STEADY)  237 mL Oral BID BM  . gabapentin  300 mg Oral QHS  . latanoprost  1 drop Both Eyes QHS  . lidocaine-prilocaine  1 application Topical Once per day on Mon Wed Fri  . multivitamin  1 tablet Oral QHS  . polycarbophil  625 mg Oral BID AC  . saccharomyces boulardii  250 mg Oral BID  . timolol  1 drop Both Eyes Daily     Dialysis Orders: [facility] TTS  4h 400/500 71kg 2/2 bathP2 Hep none RIJ TDC (AVF works butinfiltrated and not using) -Mircera 75 q 2 wks (last 5/5) No VDRA   Assessment/Plan: 1. LUEcompartment syndrome:evacuation of hematoma/ repair of bleeding brachial artery pseudoaneurysm on 5/10 per Dr. Cain/VVS. Pain control per PMD. 2. ESRD - HD TTS. HD Tomorrow on schedule. Use 4.0 K bath. No heparin.  3. Hypertension/volume - BPvariable but mostly wellcontrolled. No BP meds on home list.Increased volume on exam, per weights 2-3kg over dry, had  been lowering as OP as patient had been losing weight.Continue to titrate down as tolerated.  4. Anemia of CKD/ABLA- HGB 9.2 11/03/2020 Rec'd Aranesp 100 mcg IV 11/03/2020. Hgb 6.5 at lowest here post-op.s/p2 unit prbcs 4/49.  5. Metabolic bone disease -CCa 10, phosimproved.No binders or VDRA.Encourage nutrition. 6. Chronic combined CHF -- volume removal with UF on HD  7. PAFib- On Eliquis/amiodarone 8. Nutrition -Renal diet/fluid restriction.Encourage nutrition.Prot supp for low albumin 9. Debility -now in CIR.  Malijah Lietz H. Joal Eakle NP-C 11/06/2020, 10:10 AM  [employer] [phone]

## 2020-11-06 NOTE — Progress Notes (Signed)
Physical Therapy Session Note  Patient Details  Name: [name] MRN: [id number] Date of Birth: [birth year]  Today's Date: 11/06/2020 PT Individual Time: 0835-0920 PT Individual Time Calculation (min): 45 min   Short Term Goals: Week 1:  PT Short Term Goal 1 (Week 1): Patient will perform supine to sit with min A and mod cues. PT Short Term Goal 2 (Week 1): Patient will demonstrate sitting balance with S during simple functional task. PT Short Term Goal 3 (Week 1): Patient will perform sit to stand with mod A at least 50% of attempts. PT Short Term Goal 4 (Week 1): Patient will initiate ambulation.  Skilled Therapeutic Interventions/Progress Updates: Pt presents supine in bed and reluctantly agrees to therapy.  Pt required self manual assist to flex knees to hooklying position to thread pants over feet.  Pt able to pull pants over knees and attempts to pull over hips, but unable to complete.  Pt rolls side to side w/ min A and siderail to finish pulling pants over hips.  Pt required mod A for sup to sit after bringing LEs off EOB.  Pt performed SB transfer bed > w/c w/ mod A and verbal cues for sequencing, especially forward lean.  Pt wheeled to Dayroom for time conservation.  Pt performed Kinetron 1' x 2 trials at 55 cm/sec and then 1' at 40 cm/sec.  Pt required seated rest breaks between trials.  Pt performed LAQ 2 x 10-15.  Pt returned to room and requested return to bed 2/2 diarrhea.  Pt performed SB transfer w/ mod A.  Pt performed rolling side to side w/ min A for cleaning and replacing brief w/ NT assist.  Noted incontinence of bowel in brief.  Bed alarm on and all needs in reach.     Therapy Documentation Precautions:  Precautions Precautions: Fall Precaution Comments: L UE fistula with upper arm wound and pain Restrictions Weight Bearing Restrictions: No General:   Vital Signs: Therapy Vitals Temp: 98.1 F (36.7 C) Temp Source: Oral Pulse Rate: 73 Resp: 17 BP: (!)  140/56 Patient Position (if appropriate): Sitting Oxygen Therapy SpO2: 100 % O2 Device: Room Air Pain:0/10, although sore L arm. Pain Assessment Faces Pain Scale: Hurts a little bit Pain Type: Acute pain Pain Location: Arm Pain Orientation: Left Pain Descriptors / Indicators: Tender;Sore Pain Onset: On-going Pain Intervention(s): Repositioned Mobility:      Therapy/Group: Individual Therapy  Ladoris Gene 11/06/2020, 3:21 PM

## 2020-11-06 NOTE — Progress Notes (Signed)
Occupational Therapy Session Note  Patient Details  Name: [name] MRN: [id number] Date of Birth: [birth year]  Today's Date: 11/06/2020 OT Individual Time: 6256-3893 OT Individual Time Calculation (min): 42 min   Short Term Goals: Week 1:  OT Short Term Goal 1 (Week 1): Pt will complete UB dressing and bathing with supervision EOB or from wheelchair. OT Short Term Goal 2 (Week 1): Pt will complete LB bathing with max assist sit to stand for two consecutive sessions. OT Short Term Goal 3 (Week 1): Pt will donn pants with max assist sit to stand OT Short Term Goal 4 (Week 1): Pt will complete stand pivot transfer to the 3:1 with mod assist using the RW for support. OT Short Term Goal 5 (Week 1): Pt will use the LUE to wash the RUE with supervision and min instructional cueing for initiation.  Skilled Therapeutic Interventions/Progress Updates:    Pt received supine in bed and agreeable to OT session. After establishing a therapeutic plan, pt reported having active diarrhea and needing to be changed. Bed mobility rolling L and R with Min A and maintaining sidelying position mod I with bed rails. Pt reporting some abdominal discomfort but premedicated. Total A for hygiene/brief change with pt having 2 episodes of BM with diarrhea consistency. Scooting to [facility] in Trendelenburg using bed rails and feet with cues for mechanics. Positioned BUEs with pillows and egg crate foam. Pt reporting saturation from LUE dressings and requesting dressing change from RN. Notified RN of this. With [facility] elevated, pt completed oral hygiene set up-min A (brushing teeth and mouth wash). Pt demo'd use of LUE as functional assist to remove cap from toothpaste and reported increased ROM from yesterday. Pt education on adaptive methods for brushing teeth due to limited use/pain in LUE. Aromatherapy provided to pt for relaxation and stress relief.  Pt left supine with alarm set, call bell in reach, and all needs met.  Therapy  Documentation Precautions:  Precautions Precautions: Fall Precaution Comments: L UE fistula with upper arm wound and pain Restrictions Weight Bearing Restrictions: No ADL: ADL Eating: Set up Where Assessed-Eating: Wheelchair Grooming: Minimal assistance Where Assessed-Grooming: Wheelchair Upper Body Bathing: Minimal assistance Where Assessed-Upper Body Bathing: Edge of bed Lower Body Bathing: Dependent Where Assessed-Lower Body Bathing: Edge of bed Upper Body Dressing: Minimal assistance Where Assessed-Upper Body Dressing: Edge of bed Lower Body Dressing: Dependent Where Assessed-Lower Body Dressing: Edge of bed Toileting: Not assessed Toilet Transfer Method: Not assessed Tub/Shower Transfer: Not assessed Social research officer, government: Not assessed      Therapy/Group: Individual Therapy  Alaila Pillard A [place] 11/06/2020, 12:47 PM

## 2020-11-06 NOTE — Progress Notes (Signed)
Physical Therapy Session Note  Patient Details  Name: [name] MRN: [id number] Date of Birth: June 20, 1946  Today's Date: 11/06/2020 PT Individual Time: 1300-1415 PT Individual Time Calculation (min): 75 min   Short Term Goals: Week 1:  PT Short Term Goal 1 (Week 1): Patient will perform supine to sit with min A and mod cues. PT Short Term Goal 2 (Week 1): Patient will demonstrate sitting balance with S during simple functional task. PT Short Term Goal 3 (Week 1): Patient will perform sit to stand with mod A at least 50% of attempts. PT Short Term Goal 4 (Week 1): Patient will initiate ambulation. Week 2:    Week 3:     Skilled Therapeutic Interventions/Progress Updates:    Pain:  Pt reports no pain.  Treatment to tolerance.  Rest breaks and repositioning as needed.  Pt initially supine and agreeable to treatment session w/focus on global strengthening, functional mobility. therex in supine: Hip abd/Add x 15 Hip/knee flex/ext x 15 Clamshells x 12 TKEs x 25 Pt rolls L/R w/mod assist for donning of pants/max assist to don. Supine to side to sit w/cues for sequencing, mod assist. Pt sits w/cga, performs TKEs x 10-12 each.  Sit to stand w/[place] w/bed elevated w/mod assist. Repeated Semi stand to stand in [place] and static stand up to 30sec repeated x 3 for strenghtening. Stand to sit in wc from [place] w/mod assist.  Once in wc, pt transported to gym for continued session.  Sit to stand w/mod assist of 2 to stedy.  Attempted Sit to stand in [place] for transfer to mat, but pt unable to achieve upright, appears very fatigued.  Sit to stand w/heavy mod +2 and transfer back to wc.  In sitting, pt scoots w/mod assit. Pt transported back to room.  Sit to stand in stedy w/max assist of 1, mod assist of 1.  wc to bed via stedy.  Sit to supine w/mod assist.  Pt rolls w/mod assist to remove pants/gown.   Pt left supine w/rails up x 4, alarm set, bed in lowest position, and needs in  reach.  Therapy Documentation Precautions:  Precautions Precautions: Fall Precaution Comments: L UE fistula with upper arm wound and pain Restrictions Weight Bearing Restrictions: No  Therapy/Group: Individual Therapy  Callie Fielding, [place] 11/06/2020, 3:46 PM

## 2020-11-06 NOTE — Progress Notes (Addendum)
[name] PHYSICAL MEDICINE & REHABILITATION PROGRESS NOTE  Subjective/Complaints: Patient seen sitting up in bed this morning.  She states she slept well overnight.  She is seen by nephrology yesterday, notes reviewed-labs ordered.  She states that she had some nausea this a.m., but it was due to a pill getting stuck in her throat.  ROS: Denies CP, SOB, N/V  Objective: Vital Signs: Blood pressure (!) 107/51, pulse 67, temperature 98.6 F (37 C), temperature source Oral, resp. rate 18, weight 74.2 kg, last menstrual period 01/19/1992, SpO2 96 %. No results found. No results for input(s): WBC, HGB, HCT, PLT in the last 72 hours. No results for input(s): NA, K, CL, CO2, GLUCOSE, BUN, CREATININE, CALCIUM in the last 72 hours.  Intake/Output Summary (Last 24 hours) at 11/06/2020 1024 Last data filed at 11/06/2020 0850 Gross per 24 hour  Intake 360 ml  Output 853 ml  Net -493 ml        Physical Exam: BP (!) 107/51 (BP Location: Right Arm)   Pulse 67   Temp 98.6 F (37 C) (Oral)   Resp 18   Wt 74.2 kg   LMP 01/19/1992 (Approximate)   SpO2 96%   BMI 28.98 kg/m  Constitutional: No distress . Vital signs reviewed. HENT: Normocephalic.  Atraumatic. Eyes: EOMI. No discharge. Cardiovascular: No JVD.  RRR.  + Murmur. Respiratory: Normal effort.  No stridor.  Bilateral clear to auscultation. GI: Non-distended.  BS +. Skin: Warm and dry.   Right chest wall catheter Psych: Flat.  Normal behavior. Musc: LUE with edema and tenderness, unchanged Lower extremity edema Neuro: Alert Motor: RUE: 4-/5 proximal to distal LUE: 3/5 proximal to distal pain inhibition B/l LE: HF, KE, ADF 3-/5, unchanged  Assessment/Plan: 1. Functional deficits which require 3+ hours per day of interdisciplinary therapy in a comprehensive inpatient rehab setting.  Physiatrist is providing close team supervision and 24 hour management of active medical problems listed below.  Physiatrist and rehab team  continue to assess barriers to discharge/monitor patient progress toward functional and medical goals   Care Tool:  Bathing    Body parts bathed by patient: Left arm,Abdomen,Chest,Right upper leg,Left upper leg   Body parts bathed by helper: Front perineal area,Buttocks,Right lower leg,Left lower leg     Bathing assist Assist Level: Moderate Assistance - Patient 50 - 74%     Upper Body Dressing/Undressing Upper body dressing   What is the patient wearing?: Pull over shirt    Upper body assist Assist Level: Minimal Assistance - Patient > 75%    Lower Body Dressing/Undressing Lower body dressing      What is the patient wearing?: Incontinence brief     Lower body assist Assist for lower body dressing: Moderate Assistance - Patient 50 - 74%     Toileting Toileting Toileting Activity did not occur Landscape architect and hygiene only): Refused  Toileting assist Assist for toileting: Total Assistance - Patient < 25% (bedlevel)     Transfers Chair/bed transfer  Transfers assist     Chair/bed transfer assist level: Moderate Assistance - Patient 50 - 74% Chair/bed transfer assistive device: Sliding board   Locomotion Ambulation   Ambulation assist   Ambulation activity did not occur: Safety/medical concerns          Walk 10 feet activity   Assist  Walk 10 feet activity did not occur: Safety/medical concerns        Walk 50 feet activity   Assist Walk 50 feet with 2 turns activity  did not occur: Safety/medical concerns         Walk 150 feet activity   Assist Walk 150 feet activity did not occur: Safety/medical concerns         Walk 10 feet on uneven surface  activity   Assist Walk 10 feet on uneven surfaces activity did not occur: Safety/medical concerns         Wheelchair     Assist Will patient use wheelchair at discharge?: Yes Type of Wheelchair: Manual    Wheelchair assist level: Minimal Assistance - Patient > 75% Max  wheelchair distance: 102ft    Wheelchair 50 feet with 2 turns activity    Assist        Assist Level: Minimal Assistance - Patient > 75%   Wheelchair 150 feet activity     Assist      Assist Level: Dependent - Patient 0%    Medical Problem List and Plan: 1.  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  Antithrombotics: -DVT/anticoagulation:  Pharmaceutical: Other (comment)--renal dose Eliquis.              -antiplatelet therapy: N/A 3. Pain Management: Oxycodone prn for severe pain. Tramadol added prn for moderate pain.              Added low dose gabapentin for neuropathic pain   Relatively controlled with meds on 5/20  Monitor with increased activity.  4. Mood: LCSW to follow for evaluation and support.              -antipsychotic agents: N/A 5. Neuropsych: This patient is capable of making decisions on his own behalf. 6. Skin/Wound Care:  Routine pressure relief measures.  7. Fluids/Electrolytes/Nutrition: Strict I/Os with daily weights. Renal diet/1200 cc FR.  8. Hematoma LUE s/p evacuation: Continue to monitor pulse and for symptoms. Also elevate LUE 9. Acute on chronic anemia: Improved post transfusion. Continue aransep weekly.   Hb 9.2 on 5/17, labs ordered for Monday  Continue to monitor 10. PAF: Monitor HR tid--continue amiodarone and Eliquis.   Controlled on 5/20  Monitor with increased exertion 11.  ESRD: HD on TTS after therapy to help with tolerance of therapy.  12. Chronic UTI: On low dose Keflex for suppression.  13. B/L foot drop- Prevalon boots for moderate B/L foot drop 14. Leukocytosis  WBCs 10.7 on 5/17, labs ordered for Monday  Afebrile  Cont ro monitor 15.  Loose stools  Fiber added on 5/20   LOS: 3 days A FACE TO FACE EVALUATION WAS PERFORMED  [name] [name] 11/06/2020, 10:24 AM

## 2020-11-07 MED ORDER — HEPARIN SODIUM (PORCINE) 1000 UNIT/ML IJ SOLN
INTRAMUSCULAR | Status: AC
  Administered 2020-11-07: 3200 [IU]
  Filled 2020-11-07: qty 4

## 2020-11-07 MED ORDER — OXYCODONE HCL 5 MG PO TABS
ORAL_TABLET | ORAL | Status: AC
  Administered 2020-11-07: 5 mg via ORAL
  Filled 2020-11-07: qty 1

## 2020-11-07 NOTE — Progress Notes (Addendum)
[name] Progress Note   Subjective: Seen in room. NAD. No issues overnight. HD today on schedule.   Objective Vitals:   11/06/20 1311 11/06/20 1952 11/07/20 0440 11/07/20 0440  BP: (!) 140/56 (!) 127/51  (!) 137/56  Pulse: 73 71  71  Resp: 17 18 18 18   Temp: 98.1 F (36.7 C) 98.2 F (36.8 C) 97.9 F (36.6 C) 97.6 F (36.4 C)  TempSrc: Oral Oral Oral Oral  SpO2: 100% 99%    Weight:    75.7 kg   Physical Exam General: Pleasant elderly female in NAD Heart: Z6,X0 3/6 systolic M. No R/G.  Lungs:CTAB Abdomen: Active BS, NT Extremities: 1+ LE edema Dialysis Access: [facility] with drsg CDI. L AVF +T/B guaze drsg intact.     Additional Objective Labs: Basic Metabolic Panel: Recent Labs  Lab 10/31/20 1250 11/03/20 0843  NA 133* 133*  K 3.6 3.7  CL 99 98  CO2 30 27  GLUCOSE 79 105*  BUN 23 35*  CREATININE 3.91* 5.14*  CALCIUM 8.0* 8.2*  PHOS 2.2* 3.0   Liver Function Tests: Recent Labs  Lab 10/31/20 1250 11/03/20 0843  ALBUMIN 1.5* 1.5*   No results for input(s): LIPASE, AMYLASE in the last 168 hours. CBC: Recent Labs  Lab 11/01/20 0132 11/02/20 0200 11/03/20 0110  WBC 9.4 11.3* 10.7*  NEUTROABS 7.6 9.3* 8.7*  HGB 8.5* 8.7* 9.2*  HCT 25.9* 26.8* 28.4*  MCV 90.9 92.7 93.1  PLT 197 203 227   Blood Culture    Component Value Date/Time   SDES URINE, RANDOM 07/17/2020 1500   SPECREQUEST  07/17/2020 1500    NONE Performed at [facility], [place] [address], [place], [place] [address]    CULT MULTIPLE SPECIES PRESENT, SUGGEST RECOLLECTION (A) 07/17/2020 1500   REPTSTATUS 07/18/2020 FINAL 07/17/2020 1500    Cardiac Enzymes: No results for input(s): CKTOTAL, CKMB, CKMBINDEX, TROPONINI in the last 168 hours. CBG: No results for input(s): GLUCAP in the last 168 hours. Iron Studies: No results for input(s): IRON, TIBC, TRANSFERRIN, FERRITIN in the last 72 hours. @lablastinr3 @ Studies/Results: No results found. Medications:  .  amiodarone  200 mg Oral Daily  . apixaban  2.5 mg Oral BID  . cephALEXin  250 mg Oral q1800  . Chlorhexidine Gluconate Cloth  6 each Topical Q0600  . darbepoetin (ARANESP) injection - DIALYSIS  100 mcg Intravenous Q Thu-HD  . diclofenac Sodium  2 g Topical QID  . famotidine  20 mg Oral Daily  . feeding supplement (NEPRO CARB STEADY)  237 mL Oral BID BM  . gabapentin  300 mg Oral QHS  . latanoprost  1 drop Both Eyes QHS  . lidocaine-prilocaine  1 application Topical Once per day on Mon Wed Fri  . multivitamin  1 tablet Oral QHS  . polycarbophil  625 mg Oral BID AC  . saccharomyces boulardii  250 mg Oral BID  . timolol  1 drop Both Eyes Daily     Dialysis Orders: [facility] TTS  4h 400/500 71kg 2/2 bathP2 Hep none RIJ TDC (AVF works butinfiltrated and not using) -Mircera 75 q 2 wks (last 5/5) No VDRA   Assessment/Plan: 1. LUEcompartment syndrome:evacuation of hematoma/ repair of bleeding brachial artery pseudoaneurysm on 5/10 per Dr. Cain/VVS. Pain control per PMD. 2. ESRD - HD TTS. HD Today on schedule. Use 4.0 K bath. No heparin.  3. Hypertension/volume - BPvariable but mostly wellcontrolled. No BP meds on home list.Increased volume on exam, per weights 2-3kg over  dry, had been lowering as OP as patient had been losing weight.Continue to titrate down as tolerated.  4. Anemia of CKD/ABLA- HGB 9.2 11/03/2020 Rec'd Aranesp 100 mcg IV 11/03/2020. Hgb 6.5 at lowest here post-op.s/p2 unit prbcs 7/49. 5. Metabolic bone disease -CCa 10, phosimproved.No binders or VDRA.Encourage nutrition. 6. Chronic combined CHF -- volume removal with UF on HD  7. PAFib- On Eliquis/amiodarone 8. Nutrition -Renal diet/fluid restriction.Encourage nutrition.Prot supp for low albumin 9. Debility -now in CIR.  Amy H. Brown NP-C 11/07/2020, 10:00 AM  [facility] [phone]  Nephrology attending: Patient was seen and examined.  Chart reviewed.  I agree  with assessment and plan as outlined above. Patient is clinically stable.  Undergoing inpatient rehab.  Plan for dialysis today.  Continue current medication.  Amy James, MD [facility].

## 2020-11-08 NOTE — Progress Notes (Signed)
Occupational Therapy Session Note  Patient Details  Name: [name] MRN: [id number] Date of Birth: Apr 09, 1946  Today's Date: 11/08/2020 OT Individual Time: 0800-0900 and 1345-1430 OT Individual Time Calculation (min): 60 min and 45 min    Short Term Goals: Week 1:  OT Short Term Goal 1 (Week 1): Pt will complete UB dressing and bathing with supervision EOB or from wheelchair. OT Short Term Goal 2 (Week 1): Pt will complete LB bathing with max assist sit to stand for two consecutive sessions. OT Short Term Goal 3 (Week 1): Pt will donn pants with max assist sit to stand OT Short Term Goal 4 (Week 1): Pt will complete stand pivot transfer to the 3:1 with mod assist using the RW for support. OT Short Term Goal 5 (Week 1): Pt will use the LUE to wash the RUE with supervision and min instructional cueing for initiation.  Skilled Therapeutic Interventions/Progress Updates:    Session 1: Pt received supine in bed. Completed rolling L<>R with mod A for hygiene and donning brief/pants. Transitioned to sitting EOB with max A then completed SBT to w/c with max A and increased time. Completed oral care at sink with setup assist then pt requesting to return to bed at end of session. Completed SBT w/c>bed with light max A. Completed bridging for repositioning and core strength with fair- strength noted. Pt left with all needs in reach.   Session 2: OT session focused on LUE ROM and FM skills. Pt received supine in bed asking to work on LUE this afternoon from bed. Completed 2x10 reps AROM exercises via shoulder flexion and elbow flexion/extension. Placed 10 pegs into foam board with min assist then removed 25 pegs from board positioned vertically 2x. Pt retrieved tokens from level surface then placed into small container 2x15. At end of session, pt left supine in bed with all needs in reach and bed alarm on.   Therapy Documentation Precautions:  Precautions Precautions: Fall Precaution Comments: L UE  fistula with upper arm wound and pain Restrictions Weight Bearing Restrictions: No General:   Vital Signs:  Pain: Pain Assessment Pain Scale: 0-10 Pain Score: 2  Pain Type: Acute pain Pain Location: Arm Pain Orientation: Left Pain Descriptors / Indicators: Aching Pain Frequency: Intermittent Pain Intervention(s): Medication (See eMAR) ADL: ADL Eating: Set up Where Assessed-Eating: Wheelchair Grooming: Minimal assistance Where Assessed-Grooming: Wheelchair Upper Body Bathing: Minimal assistance Where Assessed-Upper Body Bathing: Edge of bed Lower Body Bathing: Dependent Where Assessed-Lower Body Bathing: Edge of bed Upper Body Dressing: Minimal assistance Where Assessed-Upper Body Dressing: Edge of bed Lower Body Dressing: Dependent Where Assessed-Lower Body Dressing: Edge of bed Toileting: Not assessed Toilet Transfer Method: Not assessed Tub/Shower Transfer: Not assessed Social research officer, government: Not assessed Vision   Perception    Praxis   Exercises:   Other Treatments:     Therapy/Group: Individual Therapy  Duayne Cal 11/08/2020, 12:23 PM

## 2020-11-08 NOTE — Progress Notes (Signed)
[place] PHYSICAL MEDICINE & REHABILITATION PROGRESS NOTE  Subjective/Complaints: Patient seen sitting up in bed this morning.  She states she slept well overnight.  She is still sleepy this morning.  She was seen by nephrology yesterday, notes reviewed-no changes.  ROS: Denies CP, SOB, N/V/D  Objective: Vital Signs: Blood pressure (!) 88/53, pulse 60, temperature 98 F (36.7 C), temperature source Oral, resp. rate 16, weight 74.5 kg, last menstrual period 01/19/1992, SpO2 100 %. No results found. No results for input(s): WBC, HGB, HCT, PLT in the last 72 hours. No results for input(s): NA, K, CL, CO2, GLUCOSE, BUN, CREATININE, CALCIUM in the last 72 hours.  Intake/Output Summary (Last 24 hours) at 11/08/2020 0823 Last data filed at 11/07/2020 1953 Gross per 24 hour  Intake 320 ml  Output 1000 ml  Net -680 ml        Physical Exam: BP (!) 88/53 (BP Location: Right Arm)   Pulse 60   Temp 98 F (36.7 C) (Oral)   Resp 16   Wt 74.5 kg   LMP 01/19/1992 (Approximate)   SpO2 100%   BMI 29.09 kg/m  Constitutional: No distress . Vital signs reviewed. HENT: Normocephalic.  Atraumatic. Eyes: EOMI. No discharge. Cardiovascular: No JVD.  RRR.  + Murmur. Respiratory: Normal effort.  No stridor.  Bilateral clear to auscultation. GI: Non-distended.  BS +. Skin: Warm and dry.   Right chest wall catheter. Psych: Flat.  Normal behavior. Musc: Left upper extremity with edema and tenderness,?  Improving Left lower extreme edema Neuro: Alert Motor: RUE: 4-/5 proximal to distal LUE: 3/5 proximal to distal pain inhibition B/l LE: HF, KE, ADF 3-/5, stable  Assessment/Plan: 1. Functional deficits which require 3+ hours per day of interdisciplinary therapy in a comprehensive inpatient rehab setting.  Physiatrist is providing close team supervision and 24 hour management of active medical problems listed below.  Physiatrist and rehab team continue to assess barriers to discharge/monitor  patient progress toward functional and medical goals   Care Tool:  Bathing    Body parts bathed by patient: Left arm,Abdomen,Chest,Right upper leg,Left upper leg   Body parts bathed by helper: Front perineal area,Buttocks,Right lower leg,Left lower leg     Bathing assist Assist Level: Moderate Assistance - Patient 50 - 74%     Upper Body Dressing/Undressing Upper body dressing   What is the patient wearing?: Pull over shirt    Upper body assist Assist Level: Minimal Assistance - Patient > 75%    Lower Body Dressing/Undressing Lower body dressing      What is the patient wearing?: Incontinence brief     Lower body assist Assist for lower body dressing: Moderate Assistance - Patient 50 - 74%     Toileting Toileting    Toileting assist Assist for toileting: Total Assistance - Patient < 25%     Transfers Chair/bed transfer  Transfers assist     Chair/bed transfer assist level: Moderate Assistance - Patient 50 - 74% Chair/bed transfer assistive device: Sliding board   Locomotion Ambulation   Ambulation assist   Ambulation activity did not occur: Safety/medical concerns  Assist level: 2 helpers Assistive device: Ethelene Hal Max distance: 8'   Walk 10 feet activity   Assist  Walk 10 feet activity did not occur: Safety/medical concerns        Walk 50 feet activity   Assist Walk 50 feet with 2 turns activity did not occur: Safety/medical concerns         Walk 150 feet activity  Assist Walk 150 feet activity did not occur: Safety/medical concerns         Walk 10 feet on uneven surface  activity   Assist Walk 10 feet on uneven surfaces activity did not occur: Safety/medical concerns         Wheelchair     Assist Will patient use wheelchair at discharge?: Yes Type of Wheelchair: Manual    Wheelchair assist level: Minimal Assistance - Patient > 75% Max wheelchair distance: 25ft    Wheelchair 50 feet with 2 turns  activity    Assist        Assist Level: Minimal Assistance - Patient > 75%   Wheelchair 150 feet activity     Assist      Assist Level: Dependent - Patient 0%    Medical Problem List and Plan: 1.  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  Antithrombotics: -DVT/anticoagulation:  Pharmaceutical: Other (comment)--renal dose Eliquis.              -antiplatelet therapy: N/A 3. Pain Management: Oxycodone prn for severe pain. Tramadol added prn for moderate pain.              Added low dose gabapentin for neuropathic pain   Relatively controlled with meds on 5/22  Monitor with increased activity.  4. Mood: LCSW to follow for evaluation and support.              -antipsychotic agents: N/A 5. Neuropsych: This patient is capable of making decisions on his own behalf. 6. Skin/Wound Care:  Routine pressure relief measures.  7. Fluids/Electrolytes/Nutrition: Strict I/Os with daily weights. Renal diet/1200 cc FR.  8. Hematoma LUE s/p evacuation: Continue to monitor pulse and for symptoms. Also elevate LUE 9. Acute on chronic anemia: Improved post transfusion. Continue aransep weekly.   Hb 9.2 on 5/17, labs ordered for tomorrow  Continue to monitor 10. PAF: Monitor HR tid--continue amiodarone and Eliquis.   Controlled on 5/20  Monitor with increased exertion 11.  ESRD: HD on TTS after therapy to help with tolerance of therapy.  12. Chronic UTI: On low dose Keflex for suppression.  13. B/L foot drop- Prevalon boots for moderate B/L foot drop 14. Leukocytosis  WBCs 10.7 on 5/17, labs ordered for tomorrow  Afebrile  Cont ro monitor 15.  Loose stools  Fiber added on 5/20   Frequency improving on 5/22  LOS: 5 days A FACE TO FACE EVALUATION WAS PERFORMED  Dakotah Orrego [name] 11/08/2020, 8:23 AM

## 2020-11-08 NOTE — Progress Notes (Signed)
[facility] Progress Note   Subjective: HD yesterday on schedule, 1L UF.  This morning no issues.  Ate breakfast well.  Objective Vitals:   11/07/20 1705 11/07/20 1756 11/07/20 1928 11/08/20 0411  BP: (!) 133/54 128/67 121/72 (!) 88/53  Pulse: 65 65 68 60  Resp:  19 16 16   Temp:  98.6 F (37 C) 97.9 F (36.6 C) 98 F (36.7 C)  TempSrc:  Oral Oral Oral  SpO2:  100% 100%   Weight:       Physical Exam General: Pleasant elderly female in NAD Heart: X4,J2 3/6 systolic M. No R/G.  Lungs:CTAB Abdomen: Active BS, NT Extremities: no LE edema Dialysis Access: [facility] with drsg CDI. L AVF +T/B, wrapped in multiple layers of gauze, still audible.   Additional Objective Labs: Basic Metabolic Panel: Recent Labs  Lab 11/03/20 0843  NA 133*  K 3.7  CL 98  CO2 27  GLUCOSE 105*  BUN 35*  CREATININE 5.14*  CALCIUM 8.2*  PHOS 3.0   Liver Function Tests: Recent Labs  Lab 11/03/20 0843  ALBUMIN 1.5*   No results for input(s): LIPASE, AMYLASE in the last 168 hours. CBC: Recent Labs  Lab 11/02/20 0200 11/03/20 0110  WBC 11.3* 10.7*  NEUTROABS 9.3* 8.7*  HGB 8.7* 9.2*  HCT 26.8* 28.4*  MCV 92.7 93.1  PLT 203 227   Blood Culture    Component Value Date/Time   SDES URINE, RANDOM 07/17/2020 1500   SPECREQUEST  07/17/2020 1500    NONE Performed at [facility], [place] [address] [place], [place] [address]    CULT MULTIPLE SPECIES PRESENT, SUGGEST RECOLLECTION (A) 07/17/2020 1500   REPTSTATUS 07/18/2020 FINAL 07/17/2020 1500    Cardiac Enzymes: No results for input(s): CKTOTAL, CKMB, CKMBINDEX, TROPONINI in the last 168 hours. CBG: No results for input(s): GLUCAP in the last 168 hours. Iron Studies: No results for input(s): IRON, TIBC, TRANSFERRIN, FERRITIN in the last 72 hours. @lablastinr3 @ Studies/Results: No results found. Medications:  . amiodarone  200 mg Oral Daily  . apixaban  2.5 mg Oral BID  . cephALEXin  250 mg Oral q1800  .  Chlorhexidine Gluconate Cloth  6 each Topical Q0600  . darbepoetin (ARANESP) injection - DIALYSIS  100 mcg Intravenous Q Thu-HD  . diclofenac Sodium  2 g Topical QID  . famotidine  20 mg Oral Daily  . feeding supplement (NEPRO CARB STEADY)  237 mL Oral BID BM  . gabapentin  300 mg Oral QHS  . latanoprost  1 drop Both Eyes QHS  . lidocaine-prilocaine  1 application Topical Once per day on Mon Wed Fri  . multivitamin  1 tablet Oral QHS  . polycarbophil  625 mg Oral BID AC  . saccharomyces boulardii  250 mg Oral BID  . timolol  1 drop Both Eyes Daily     Dialysis Orders: [facility] TTS  4h 400/500 71kg 2/2 bathP2 Hep none RIJ TDC (AVF works butinfiltrated and not using) -Mircera 75 q 2 wks (last 5/5) No VDRA   Assessment/Plan: 1. LUEcompartment syndrome:evacuation of hematoma/ repair of bleeding brachial artery pseudoaneurysm on 5/10 per Dr. Cain/VVS. Pain control per PMD. 2. ESRD - HD TTS. HD on schedule- next for 11/10/20. Use 4.0 K bath. No heparin. Pre HD CBC/ RFP. 3. Hypertension/volume - BPvariable but mostly wellcontrolled. No BP meds on home list.Increased volume on exam, per weights 2-3kg over dry, had been lowering as OP as patient had been losing weight.Continue to titrate down as  tolerated.  4. Anemia of CKD/ABLA- HGB 9.2 11/03/2020 Rec'd Aranesp 100 mcg IV 11/03/2020. Hgb 6.5 at lowest here post-op.s/p2 unit prbcs 3/22. 5. Metabolic bone disease -CCa 10, phosimproved.No binders or VDRA.Encourage nutrition. 6. Chronic combined CHF -- volume removal with UF on HD  7. PAFib- On Eliquis/amiodarone 8. Nutrition -Renal diet/fluid restriction.Encourage nutrition.Prot supp for low albumin 9. Debility -now in CIR.  Madelon Lips MD 11/08/2020, 10:25 AM  [facility] [phone]

## 2020-11-08 NOTE — Progress Notes (Signed)
Physical Therapy Session Note  Patient Details  Name: [name] MRN: [id number] Date of Birth: 1946-04-04  Today's Date: 11/08/2020 PT Individual Time: 1006-1055 and 1500-1530 PT Individual Time Calculation (min): 49 min and 30 min   Short Term Goals: Week 1:  PT Short Term Goal 1 (Week 1): Patient will perform supine to sit with min A and mod cues. PT Short Term Goal 2 (Week 1): Patient will demonstrate sitting balance with S during simple functional task. PT Short Term Goal 3 (Week 1): Patient will perform sit to stand with mod A at least 50% of attempts. PT Short Term Goal 4 (Week 1): Patient will initiate ambulation.  Skilled Therapeutic Interventions/Progress Updates:     Session 1: Patient in bed asleep upon PT arrival. Patient aroused to verbal stimulation and agreeable to PT session. Patient denied pain during session.  Patient reported feeling drowsy this morning. Noted B lower extremity pitting edema and no recent medication given to indicate increased drowsiness. RN reported edema is ongoing and medical team aware and managing during dialysis and stated that patient tends to be drowsy the morning after dialysis.   Vitals: Supine: BP 109/47, HR 57  Sitting: BP 128/55, HR 58  Therapeutic Activity: Bed Mobility: Patient performed supine to/from sit with mod-max A with use of bed rail in a flat bed. Provided verbal cues for rolling to L side-lying to push up to sit, use of L arm to lower her trunk to lying and brining knees to chest to lift her legs off/on the bed. Transfers: Patient performed sit to/from stand 2x2 min in the [place] for prolonged standing and focus on gluteal activation and shoulder depression for erect posture and trunk control out of sling. Patient performed slide board transfers bed<>w/c with mod-max A with second person assist x2 due to fatigue and total A for board placement. Provided cues for hand placement, board placement, and head-hips relationship  for proper technique and decreased assist with transfers.   Wheelchair Mobility:  Patient was transported in the w/c with total A throughout session for energy conservation and time management.  Patient intermittently falling asleep in sitting and in standing x1 with L trunk lean throughout session. Limited participation in activities due to fatigue. Patient missed 11 min of skilled PT due to fatigue, RN made aware. Will attempt to make-up missed time as able.    Patient in bed asleep at end of session with breaks locked, bed alarm set, and all needs within reach.   Session 2: Patient in bed asleep upon PT arrival. Patient required increased time to arouse to verbal and tactile stimulation. Patient reports that she has slept a lot and that she feels very fatigued. Requested bed level interventions due to fatigue this afternoon. Patient denied pain during session.  Patient intermittently closed her eyes throughout exercises, but was able to maintain arousal with cuing for full 30 min session. Maintains R cervical rotation with lateral trunk flexion in sitting and lying in the bed despite adjustments.   Therapeutic Exercise: Patient performed the following exercises with verbal and tactile cues for proper technique. -B heel slides 2x10 -B SRL 2x10 -B SAQ 2x10 -B hip abduction 2x10 -hook-lying hip abd/add 2x10 -bridging 2x5  Patient sitting up in bed with lights and TV on at end of session with breaks locked, bed alarm set, and all needs within reach.    Therapy Documentation Precautions:  Precautions Precautions: Fall Precaution Comments: L UE fistula with upper arm wound and  pain Restrictions Weight Bearing Restrictions: No General: PT Amount of Missed Time (min): 11 Minutes PT Missed Treatment Reason: Patient fatigue   Therapy/Group: Individual Therapy  Rylynn Kobs L Donovyn Guidice PT, DPT  11/08/2020, 12:52 PM

## 2020-11-09 LAB — CBC WITH DIFFERENTIAL/PLATELET
Abs Immature Granulocytes: 0.03 10*3/uL (ref 0.00–0.07)
Basophils Absolute: 0.1 10*3/uL (ref 0.0–0.1)
Basophils Relative: 1 %
Eosinophils Absolute: 0.3 10*3/uL (ref 0.0–0.5)
Eosinophils Relative: 3 %
HCT: 32.1 % — ABNORMAL LOW (ref 36.0–46.0)
Hemoglobin: 10 g/dL — ABNORMAL LOW (ref 12.0–15.0)
Immature Granulocytes: 0 %
Lymphocytes Relative: 7 %
Lymphs Abs: 0.7 10*3/uL (ref 0.7–4.0)
MCH: 30.9 pg (ref 26.0–34.0)
MCHC: 31.2 g/dL (ref 30.0–36.0)
MCV: 99.1 fL (ref 80.0–100.0)
Monocytes Absolute: 0.7 10*3/uL (ref 0.1–1.0)
Monocytes Relative: 7 %
Neutro Abs: 8.5 10*3/uL — ABNORMAL HIGH (ref 1.7–7.7)
Neutrophils Relative %: 82 %
Platelets: 270 10*3/uL (ref 150–400)
RBC: 3.24 MIL/uL — ABNORMAL LOW (ref 3.87–5.11)
RDW: 23.7 % — ABNORMAL HIGH (ref 11.5–15.5)
WBC: 10.5 10*3/uL (ref 4.0–10.5)
nRBC: 0 % (ref 0.0–0.2)

## 2020-11-09 NOTE — Progress Notes (Signed)
  [facility] Progress Note   Dialysis Orders: [facility] TTS  4h 400/500 71kg 2/2 bathP2 Hep none RIJ TDC (AVF works butinfiltrated and not using) -Mircera 75 q 2 wks (last 5/5) No VDRA  Assessment/ Plan:   1. LUEcompartment syndrome:evacuation of hematoma/ repair of bleeding brachial artery pseudoaneurysm on 5/10 per Dr. Cain/VVS. Pain control per PMD. 2. ESRD - HD TTS. HDon schedule - next for 11/10/20. Use 4.0 K bath. No heparin.Pre HD CBC/ RFP. 3. Hypertension/volume - BPvariable but mostly wellcontrolled. No BP meds on home list.Increased volume on exam, per weights 2-3kg over dry, had been lowering as OP as patient had been losing weight.Continue to titrate down as tolerated.  4. Anemia of CKD/ABLA-HGB 9.2 11/03/2020 Rec'd Aranesp 100 mcg IV 11/03/2020.Hgb 6.5 at lowest here post-op.s/p2 unit prbcs 2/99. 5. Metabolic bone disease -CCa 10, phosimproved.No binders or VDRA.Encourage nutrition. 6. Chronic combined CHF -- volume removal with UF on HD  7. PAFib- On Eliquis/amiodarone 8. Nutrition -Renal diet/fluid restriction.Encourage nutrition.Prot supp for low albumin 9. Debility -now in CIR.   Subjective:   She had some dizziness standing this am o/w tolerating PT.    Objective:   BP (!) 132/56 (BP Location: Right Arm)   Pulse 66   Temp 97.9 F (36.6 C)   Resp 17   Wt 74.3 kg   LMP 01/19/1992 (Approximate)   SpO2 100%   BMI 29.02 kg/m   Intake/Output Summary (Last 24 hours) at 11/09/2020 1446 Last data filed at 11/09/2020 0900 Gross per 24 hour  Intake 640 ml  Output 1 ml  Net 639 ml   Weight change: -1.2 kg  Physical Exam: General:Pleasant elderly female in NAD MEQAS:T4,H9 3/6 systolic M. No R/G. Lungs:CTAB Abdomen:Active BS, NT Extremities:no LE edema Dialysis Access:[facility] TDC with drsg CDI. Lt AVF +T/B, wrapped in multiple layers of gauze, still audible.  Imaging: No results  found.  Labs: BMET Recent Labs  Lab 11/03/20 0843  NA 133*  K 3.7  CL 98  CO2 27  GLUCOSE 105*  BUN 35*  CREATININE 5.14*  CALCIUM 8.2*  PHOS 3.0   CBC Recent Labs  Lab 11/03/20 0110 11/09/20 0948  WBC 10.7* 10.5  NEUTROABS 8.7* 8.5*  HGB 9.2* 10.0*  HCT 28.4* 32.1*  MCV 93.1 99.1  PLT 227 270    Medications:    . amiodarone  200 mg Oral Daily  . apixaban  2.5 mg Oral BID  . cephALEXin  250 mg Oral q1800  . Chlorhexidine Gluconate Cloth  6 each Topical Q0600  . darbepoetin (ARANESP) injection - DIALYSIS  100 mcg Intravenous Q Thu-HD  . diclofenac Sodium  2 g Topical QID  . famotidine  20 mg Oral Daily  . feeding supplement (NEPRO CARB STEADY)  237 mL Oral BID BM  . gabapentin  300 mg Oral QHS  . latanoprost  1 drop Both Eyes QHS  . lidocaine-prilocaine  1 application Topical Once per day on Mon Wed Fri  . multivitamin  1 tablet Oral QHS  . polycarbophil  625 mg Oral BID AC  . saccharomyces boulardii  250 mg Oral BID  . timolol  1 drop Both Eyes Daily      Otelia Santee, MD 11/09/2020, 2:46 PM

## 2020-11-09 NOTE — Progress Notes (Signed)
Physical Therapy Session Note  Patient Details  Name: [name] MRN: [id number] Date of Birth: [birth year]  Today's Date: 11/09/2020 PT Individual Time:Session1: 6629-4765; Session2: 1345-1500 PT Individual Time Calculation (min): 60 min & 75 min  Short Term Goals: Week 1:  PT Short Term Goal 1 (Week 1): Patient will perform supine to sit with min A and mod cues. PT Short Term Goal 2 (Week 1): Patient will demonstrate sitting balance with S during simple functional task. PT Short Term Goal 3 (Week 1): Patient will perform sit to stand with mod A at least 50% of attempts. PT Short Term Goal 4 (Week 1): Patient will initiate ambulation.  Skilled Therapeutic Interventions/Progress Updates:    Session1:  Patient in supine and reports was thinking had another hour prior to PT pointing out clock which was 1 hour behind.  Fixed clock and pt in supine for warm up therex including ankle pumps x 10, heel slides x 5 AAROM.  Patient supine to sit with mod A for scooting hips and lifting trunk.  Patient scooting forward with mod A for hip to hip and lateral leans.  Patient leaning back in sitting so performed forward leans x 2 for lumbar stretching, then using gait belt for anterior pelvic tilt and tall posture with scapular squeeze x 5 sec hold x 5 reps.  Patient sit to stand from elevated surface to RW max A.  Patient standing with posterior bias, attempted stepping to chair, but pt with bilateral knee buckling so sat on EOB.  Slide board transfer to w/c mod A +2.  In w/c patient assisted to therapy gym.  Patient seated or BP testing, then sit to stand to EVA walker and BP tested again, as noted below, but unable to maintain standing till completion of BP measurement with increased knee buckling over time.   Patient sit to stand x 1 more trial with difficulty getting upright so returned to sitting.  Slide board transfer to mat with +2 mod to max A increased time and mod cues and assist for anterior weight  shift.  Patient seated with feet on stool reaching up with R arm for trunk extension, but c/o pain lower back.  PT seated on ball behind pt for back support performed reaching again x 5 with improved tolerance.    Slide board back to w/c with max A of 1 increased time, max cue for anterior weight shift and assist.  Patient assisted in w/c to room and left up in chair for next session in 15 minutes with NT in the room.  [place]:  Patient in supine and states shower felt good, has on clothes from home.  Reports pain in L arm this pm 6/10.  Patient supine to sit using rail with mod cues and min to mod A.  Patient performed slide board transfer to w/c max A of 1.  Patient in w/c pushing with R UE only with min A x 40'.  Patient assisted to dayroom and performed Kinetron for LE strength @ 60 cm/sec 3 x 1 minute bouts.  Meanwhile PT applying theraband around L w/c wheel rim to assist with w/c propulsion.    Patient sit to stand using rails on Kinetron for removing chair cushion.  Patient propelled w/c with feet and bilat UE's with S to min A for L UE 2 x 30' with increased time.    Patient transfer to mat via slide board with +2 max A due to leg sticking to board as  pt wearing shorts.  Patient seated with support from ball at back with therapist sitting on ball for reaching for and tossing bean bags to work on trunk control, anterior weight shift and balance.  Patient transferred to w/c via slide board with +2 mod A.  Assisted in w/c to room.  Transfer to bed via slide board with +2 mod A.  Sit to supine with mod A for LE's and mod A +2 for positioning.  Left in supine with all needs and call bell in reach, L arm on pillows, bed alarm active. Therapy Documentation Precautions:  Precautions Precautions: Fall Precaution Comments: L UE fistula with upper arm wound and pain Restrictions Weight Bearing Restrictions: No Vital Signs: Orthostatic VS for the past 24 hrs (Last 3 readings):  BP- Sitting BP-  Standing at 0 minutes Pulse- Standing at 0 minutes  11/09/20 1200 110/52 95/69 72    Pain: Pain Assessment Pain Scale: 0-10 Pain Score: 0-No pain    Therapy/Group: Individual Therapy  Reginia Naas  [place], PT 11/09/2020, 8:20 AM

## 2020-11-09 NOTE — Progress Notes (Signed)
Occupational Therapy Session Note  Patient Details  Name: [name] MRN: [id number] Date of Birth: September 07, 1945  Today's Date: 11/09/2020 OT Individual Time: 0867-6195 OT Individual Time Calculation (min): 60 min    Short Term Goals: Week 1:  OT Short Term Goal 1 (Week 1): Pt will complete UB dressing and bathing with supervision EOB or from wheelchair. OT Short Term Goal 2 (Week 1): Pt will complete LB bathing with max assist sit to stand for two consecutive sessions. OT Short Term Goal 3 (Week 1): Pt will donn pants with max assist sit to stand OT Short Term Goal 4 (Week 1): Pt will complete stand pivot transfer to the 3:1 with mod assist using the RW for support. OT Short Term Goal 5 (Week 1): Pt will use the LUE to wash the RUE with supervision and min instructional cueing for initiation.  Skilled Therapeutic Interventions/Progress Updates:    Pt received seated in w/c with staff present for blood draw, agreeable to OT. Pt engaged in oral hygiene and UB bathing w/c level at sink mod A to wash RUE. Pt reported decreased grasp in functional activities and increased pain in LUE when reaching s/p surgery. UB dressing mod A for pullover shirt and to hook bra clasps; min vc's for technique. Noted increased use of LUE to assist in UB adl tasks (min vc's for LUE participation). Pt engaged in sitting in w/c with back unsupported during bathing tasks with min vc's for body mechanics. Slide board transfer w/c > bed +2 max A with vc's for body mechanics and feet placement using aerobic step for feet to be supported. Sit<>sup max A. LB dressing max A. LB bathing max A for wiping buttocks from BM smear; pt set up A supine for peri hygiene and washing upper portion of BLEs. Bed mobility mod A with bed rails with less A required to roll L. Pt engaged in trunk stretching and conditioning activities sitting unsupported EOB targeting trunk EXT and rotation, scapular retraction, and decrease in posterior pelvic  tilt in preparation for functional transfers and BADLs. Pt left supine in bed positioned with pillows under LUE, alarm set, call bell in reach, and all needs met.  Therapy Documentation Precautions:  Precautions Precautions: Fall Precaution Comments: L UE fistula with upper arm wound and pain Restrictions Weight Bearing Restrictions: No Pain: Pain Assessment Pain Scale: 0-10 Pain Score: 0-No pain   Therapy/Group: Individual Therapy  [name] 11/09/2020, 9:53 AM

## 2020-11-09 NOTE — Progress Notes (Signed)
[facility] PHYSICAL MEDICINE & REHABILITATION PROGRESS NOTE  Subjective/Complaints: No complaints this morning Worked well with therapy. Does note first therapy session was rough for her- legs felt very weak.  BP soft this morning, but otherwise labile   ROS: denies CP, SOB, N/V/D  Objective: Vital Signs: Blood pressure (!) 120/56, pulse 65, temperature 97.9 F (36.6 C), temperature source Oral, resp. rate 18, weight 74.3 kg, last menstrual period 01/19/1992, SpO2 98 %. No results found. Recent Labs    11/09/20 0948  WBC 10.5  HGB 10.0*  HCT 32.1*  PLT 270   No results for input(s): NA, K, CL, CO2, GLUCOSE, BUN, CREATININE, CALCIUM in the last 72 hours.  Intake/Output Summary (Last 24 hours) at 11/09/2020 1138 Last data filed at 11/09/2020 0900 Gross per 24 hour  Intake 840 ml  Output 1 ml  Net 839 ml        Physical Exam: BP (!) 120/56 (BP Location: Right Arm)   Pulse 65   Temp 97.9 F (36.6 C) (Oral)   Resp 18   Wt 74.3 kg   LMP 01/19/1992 (Approximate)   SpO2 98%   BMI 29.02 kg/m  Gen: no distress, normal appearing HEENT: oral mucosa pink and moist, NCAT Cardio: Reg rate, +murmur Chest: normal effort, normal rate of breathing Abd: soft, non-distended Ext: no edema Psych: pleasant, normal affect Skin: Warm and dry.   Right chest wall catheter. Psych: Flat.  Normal behavior. Musc: Left upper extremity with edema and tenderness,?  Improving Left lower extreme edema Neuro: Alert Motor: RUE: 4-/5 proximal to distal LUE: 3/5 proximal to distal pain inhibition B/l LE: HF, KE, ADF 3-/5, stable  Assessment/Plan: 1. Functional deficits which require 3+ hours per day of interdisciplinary therapy in a comprehensive inpatient rehab setting.  Physiatrist is providing close team supervision and 24 hour management of active medical problems listed below.  Physiatrist and rehab team continue to assess barriers to discharge/monitor patient progress toward functional  and medical goals   Care Tool:  Bathing    Body parts bathed by patient: Left arm,Abdomen,Chest,Right upper leg,Left upper leg,Front perineal area,Face   Body parts bathed by helper: Buttocks,Right arm     Bathing assist Assist Level: Moderate Assistance - Patient 50 - 74%     Upper Body Dressing/Undressing Upper body dressing   What is the patient wearing?: Pull over shirt,Bra    Upper body assist Assist Level: Moderate Assistance - Patient 50 - 74% (mod for bra and to doff paper scrub shirt)    Lower Body Dressing/Undressing Lower body dressing      What is the patient wearing?: Pants     Lower body assist Assist for lower body dressing: Maximal Assistance - Patient 25 - 49%     Toileting Toileting    Toileting assist Assist for toileting: Total Assistance - Patient < 25%     Transfers Chair/bed transfer  Transfers assist  Chair/bed transfer activity did not occur:  (sliding board)  Chair/bed transfer assist level: Maximal Assistance - Patient 25 - 49% Chair/bed transfer assistive device: Sliding board   Locomotion Ambulation   Ambulation assist   Ambulation activity did not occur: Safety/medical concerns  Assist level: 2 helpers Assistive device: Ethelene Hal Max distance: 8'   Walk 10 feet activity   Assist  Walk 10 feet activity did not occur: Safety/medical concerns        Walk 50 feet activity   Assist Walk 50 feet with 2 turns activity did not occur: Safety/medical  concerns         Walk 150 feet activity   Assist Walk 150 feet activity did not occur: Safety/medical concerns         Walk 10 feet on uneven surface  activity   Assist Walk 10 feet on uneven surfaces activity did not occur: Safety/medical concerns         Wheelchair     Assist Will patient use wheelchair at discharge?: Yes Type of Wheelchair: Manual    Wheelchair assist level: Minimal Assistance - Patient > 75% Max wheelchair distance: 8ft     Wheelchair 50 feet with 2 turns activity    Assist        Assist Level: Minimal Assistance - Patient > 75%   Wheelchair 150 feet activity     Assist      Assist Level: Dependent - Patient 0%    Medical Problem List and Plan: 1.  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  Antithrombotics: -DVT/anticoagulation:  Pharmaceutical: Other (comment)--renal dose Eliquis.              -antiplatelet therapy: N/A 3. Pain Management: Oxycodone prn for severe pain. Tramadol added prn for moderate pain.              Added low dose gabapentin for neuropathic pain   Relatively controlled with meds on 5/23  Monitor with increased activity.  4. Mood: LCSW to follow for evaluation and support.              -antipsychotic agents: N/A 5. Neuropsych: This patient is capable of making decisions on his own behalf. 6. Skin/Wound Care:  Routine pressure relief measures.  7. Fluids/Electrolytes/Nutrition: Strict I/Os with daily weights. Renal diet/1200 cc FR.  8. Hematoma LUE s/p evacuation: Continue to monitor pulse and for symptoms. Also elevate LUE 9. Acute on chronic anemia: Improved post transfusion. Continue aransep weekly.   Hb 9.2 on 5/17, trending up 5/23, monitor weekly.  10. PAF: Continue amiodarone and Eliquis.   Controlled on 5/23, monitor TID  Monitor with increased exertion 11.  ESRD: HD on TTS after therapy to help with tolerance of therapy.  12. Chronic UTI: On low dose Keflex for suppression.  13. B/L foot drop- Prevalon boots for moderate B/L foot drop 14. Leukocytosis  WBCs 10.7 on 5/17, 10.5 on 5/23, monitor weekly.   Afebrile  Cont ro monitor 15.  Loose stools  Fiber added on 5/20   Frequency improving on 5/22  LOS: 6 days A FACE TO FACE EVALUATION WAS PERFORMED  [name] [name] 11/09/2020, 11:38 AM

## 2020-11-10 LAB — RENAL FUNCTION PANEL
Albumin: 1.6 g/dL — ABNORMAL LOW (ref 3.5–5.0)
Anion gap: 9 (ref 5–15)
BUN: 26 mg/dL — ABNORMAL HIGH (ref 8–23)
CO2: 25 mmol/L (ref 22–32)
Calcium: 7.9 mg/dL — ABNORMAL LOW (ref 8.9–10.3)
Chloride: 99 mmol/L (ref 98–111)
Creatinine, Ser: 4.89 mg/dL — ABNORMAL HIGH (ref 0.44–1.00)
GFR, Estimated: 9 mL/min — ABNORMAL LOW (ref >60)
Glucose, Bld: 116 mg/dL — ABNORMAL HIGH (ref 70–99)
Phosphorus: 2.4 mg/dL — ABNORMAL LOW (ref 2.5–4.6)
Potassium: 4.1 mmol/L (ref 3.5–5.1)
Sodium: 133 mmol/L — ABNORMAL LOW (ref 135–145)

## 2020-11-10 LAB — CBC
HCT: 30.3 % — ABNORMAL LOW (ref 36.0–46.0)
Hemoglobin: 9.5 g/dL — ABNORMAL LOW (ref 12.0–15.0)
MCH: 31 pg (ref 26.0–34.0)
MCHC: 31.4 g/dL (ref 30.0–36.0)
MCV: 99 fL (ref 80.0–100.0)
Platelets: 250 10*3/uL (ref 150–400)
RBC: 3.06 MIL/uL — ABNORMAL LOW (ref 3.87–5.11)
RDW: 23.7 % — ABNORMAL HIGH (ref 11.5–15.5)
WBC: 12.4 10*3/uL — ABNORMAL HIGH (ref 4.0–10.5)
nRBC: 0 % (ref 0.0–0.2)

## 2020-11-10 MED ORDER — HEPARIN SODIUM (PORCINE) 1000 UNIT/ML IJ SOLN
INTRAMUSCULAR | Status: AC
  Filled 2020-11-10: qty 4

## 2020-11-10 MED ORDER — ACETAMINOPHEN 325 MG PO TABS
ORAL_TABLET | ORAL | Status: AC
  Filled 2020-11-10: qty 2

## 2020-11-10 NOTE — Progress Notes (Signed)
Physical Therapy Session Note  Patient Details  Name: [name] MRN: [id number] Date of Birth: 1945/08/12  Today's Date: 11/10/2020 PT Individual Time:Session1: 8329-1916; Session2: missed 60 minutes PT Individual Time Calculation (min): 30 min   Short Term Goals: Week 1:  PT Short Term Goal 1 (Week 1): Patient will perform supine to sit with min A and mod cues. PT Short Term Goal 2 (Week 1): Patient will demonstrate sitting balance with S during simple functional task. PT Short Term Goal 3 (Week 1): Patient will perform sit to stand with mod A at least 50% of attempts. PT Short Term Goal 4 (Week 1): Patient will initiate ambulation.  Skilled Therapeutic Interventions/Progress Updates:    Session1: Patient in supine and reports she did not keep TEDS on overnight and reminded her okay to use SCD's overnight.  Total A to don knee hi TEDS and slipper socks in supine. Patient performed LE therex in supine for warm up including ankle pumps and AAROM for heel slides.  Patient supine to sit with mod A and increased time, using rail. Patient performed slide board transfer to w/c with +2 mod to max A.  A for scooting back in w/c.  Assisted to ortho gym in w/c.  Performed sit to stand dependent in standing frame and able to tolerate standing for about 2 minutes performing UE shoulder flexion alternating L & R x 5 with AAROM.  Patient assisted in w/c to room and left up in w/c with call bell in reach, NT aware will need back to bed with +2 A.  [place]:  Patient missed 60 minutes skilled PT due to out of the room for hemodialysis.   Therapy Documentation Precautions:  Precautions Precautions: Fall Precaution Comments: L UE fistula with upper arm wound and pain Restrictions Weight Bearing Restrictions: No Pain: Pain Assessment Faces Pain Scale: Hurts little more Pain Type: Acute pain Pain Location: Arm Pain Orientation: Left Pain Descriptors / Indicators: Aching Pain Onset: With  Activity Pain Intervention(s): Repositioned   Therapy/Group: Individual Therapy  Reginia Naas  [place], [place] 11/10/2020, 12:24 PM

## 2020-11-10 NOTE — Progress Notes (Signed)
Occupational Therapy Session Note  Patient Details  Name: [name] MRN: [id number] Date of Birth: May 23, 1946  Today's Date: 11/10/2020 OT Individual Time: 1100-1204  &  Missed PM session due to change of dialysis time this afternoon OT Individual Time Calculation (min): 64 min   &   0 min   Short Term Goals: Week 1:  OT Short Term Goal 1 (Week 1): Pt will complete UB dressing and bathing with supervision EOB or from wheelchair. OT Short Term Goal 2 (Week 1): Pt will complete LB bathing with max assist sit to stand for two consecutive sessions. OT Short Term Goal 3 (Week 1): Pt will donn pants with max assist sit to stand OT Short Term Goal 4 (Week 1): Pt will complete stand pivot transfer to the 3:1 with mod assist using the RW for support. OT Short Term Goal 5 (Week 1): Pt will use the LUE to wash the RUE with supervision and min instructional cueing for initiation.  Skilled Therapeutic Interventions/Progress Updates:    AM session:   Patient in bed, alert and ready for therapy session.  She states that pain is under control at this time.  She asks to work on mobility and strengthening this session.  Supine to sitting edge of bed with mod A.  Unsupported sitting with CS and kyphotic posture. Cues and assist for posture and trunk mobility activities.  She completed oral care with set up and washes hair with shampoo cap.  Upper and lower body AROM/AAROM in unsupported sitting and supine positions.  Sit to stand from elevated bed surface to stedy mod/max A x 5 - able to maintain standing for 3-5 seconds per attempt stand to sit min A.  Sit to supine max A, she assists with scooting up in bed (overall mod A).  Practice rolling in bed with min A in both directions.  Daughter attended second half of session - reviewed progress to date and current functional status.  Patient remained in bed at close of session, bed alarm set and call bell in hand.     PM session:   Missed due to change of  dialysis schedule     Therapy Documentation Precautions:  Precautions Precautions: Fall Precaution Comments: L UE fistula with upper arm wound and pain Restrictions Weight Bearing Restrictions: No   Therapy/Group: Individual Therapy  Carlos Levering 11/10/2020, 7:52 AM

## 2020-11-10 NOTE — Progress Notes (Signed)
[name] PHYSICAL MEDICINE & REHABILITATION PROGRESS NOTE  Subjective/Complaints: Patient seen laying in bed this morning.  She states she slept well overnight.  She was seen by nephrology yesterday, notes reviewed-no changes.  She notes improvement in bowel movements.  ROS: Denies CP, SOB, N/V/D  Objective: Vital Signs: Blood pressure (!) 117/58, pulse 65, temperature 97.8 F (36.6 C), temperature source Oral, resp. rate 18, weight 74.2 kg, last menstrual period 01/19/1992, SpO2 98 %. No results found. Recent Labs    11/09/20 0948  WBC 10.5  HGB 10.0*  HCT 32.1*  PLT 270   No results for input(s): NA, K, CL, CO2, GLUCOSE, BUN, CREATININE, CALCIUM in the last 72 hours.  Intake/Output Summary (Last 24 hours) at 11/10/2020 1215 Last data filed at 11/10/2020 0700 Gross per 24 hour  Intake 780 ml  Output --  Net 780 ml        Physical Exam: BP (!) 117/58 (BP Location: Right Arm)   Pulse 65   Temp 97.8 F (36.6 C) (Oral)   Resp 18   Wt 74.2 kg   LMP 01/19/1992 (Approximate)   SpO2 98%   BMI 28.98 kg/m  Constitutional: No distress . Vital signs reviewed. HENT: Normocephalic.  Atraumatic. Eyes: EOMI. No discharge. Cardiovascular: No JVD.  RRR. Respiratory: Normal effort.  No stridor.  Bilateral clear to auscultation. GI: Non-distended.  BS +. Skin: Warm and dry.   Right chest wall catheter. Psych: Flat.  Normal behavior. Musc: Left upper extremity with edema and tenderness,?  Unchanged Left lower extreme edema, unchanged Neuro: Alert Motor: RUE: 4-/5 proximal to distal LUE: 3/5 proximal to distal pain inhibition B/l LE: HF, KE, ADF 3-/5, unchanged  Assessment/Plan: 1. Functional deficits which require 3+ hours per day of interdisciplinary therapy in a comprehensive inpatient rehab setting.  Physiatrist is providing close team supervision and 24 hour management of active medical problems listed below.  Physiatrist and rehab team continue to assess barriers to  discharge/monitor patient progress toward functional and medical goals   Care Tool:  Bathing    Body parts bathed by patient: Left arm,Abdomen,Chest,Right upper leg,Left upper leg,Front perineal area,Face   Body parts bathed by helper: Buttocks,Right arm     Bathing assist Assist Level: Moderate Assistance - Patient 50 - 74%     Upper Body Dressing/Undressing Upper body dressing   What is the patient wearing?: Pull over shirt,Bra    Upper body assist Assist Level: Moderate Assistance - Patient 50 - 74% (mod for bra and to doff paper scrub shirt)    Lower Body Dressing/Undressing Lower body dressing      What is the patient wearing?: Pants     Lower body assist Assist for lower body dressing: Maximal Assistance - Patient 25 - 49%     Toileting Toileting    Toileting assist Assist for toileting: Total Assistance - Patient < 25%     Transfers Chair/bed transfer  Transfers assist  Chair/bed transfer activity did not occur:  (sliding board)  Chair/bed transfer assist level: 2 Helpers Chair/bed transfer assistive device: Sliding board   Locomotion Ambulation   Ambulation assist   Ambulation activity did not occur: Safety/medical concerns  Assist level: 2 helpers Assistive device: Ethelene Hal Max distance: 8'   Walk 10 feet activity   Assist  Walk 10 feet activity did not occur: Safety/medical concerns        Walk 50 feet activity   Assist Walk 50 feet with 2 turns activity did not occur: Safety/medical concerns  Walk 150 feet activity   Assist Walk 150 feet activity did not occur: Safety/medical concerns         Walk 10 feet on uneven surface  activity   Assist Walk 10 feet on uneven surfaces activity did not occur: Safety/medical concerns         Wheelchair     Assist Will patient use wheelchair at discharge?: Yes Type of Wheelchair: Manual    Wheelchair assist level: Minimal Assistance - Patient > 75% Max  wheelchair distance: 68'    Wheelchair 50 feet with 2 turns activity    Assist        Assist Level: Minimal Assistance - Patient > 75%   Wheelchair 150 feet activity     Assist      Assist Level: Dependent - Patient 0%    Medical Problem List and Plan: 1.  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  Antithrombotics: -DVT/anticoagulation:  Pharmaceutical: Other (comment)--renal dose Eliquis.              -antiplatelet therapy: N/A 3. Pain Management: Oxycodone prn for severe pain. Tramadol added prn for moderate pain.              Added low dose gabapentin for neuropathic pain   Relatively controlled with meds on 5/24  Monitor with increased activity.  4. Mood: LCSW to follow for evaluation and support.              -antipsychotic agents: N/A 5. Neuropsych: This patient is capable of making decisions on his own behalf. 6. Skin/Wound Care:  Routine pressure relief measures.  7. Fluids/Electrolytes/Nutrition: Strict I/Os with daily weights. Renal diet/1200 cc FR.  8. Hematoma LUE s/p evacuation: Continue to monitor pulse and for symptoms. Also elevate LUE 9. Acute on chronic anemia: Improved post transfusion. Continue aransep weekly.   Hb 10.0 on 5/23 10. PAF: Continue amiodarone and Eliquis.   Controlled on 5/24, monitor TID  Monitor with increased exertion 11.  ESRD: HD on TTS after therapy to help with tolerance of therapy.  12. Chronic UTI: On low dose Keflex for suppression.  13. B/L foot drop- Prevalon boots for moderate B/L foot drop 14. Leukocytosis  WBCs 10.5 on 5/23.   Afebrile  Cont ro monitor 15.  Loose stools  Fiber added on 5/20   Improving  LOS: 7 days A FACE TO FACE EVALUATION WAS PERFORMED  [name] [name] 11/10/2020, 12:15 PM

## 2020-11-10 NOTE — Progress Notes (Signed)
  [facility] Progress Note   Dialysis Orders: [facility] TTS  4h 400/500 71kg 2/2 bathP2 Hep none RIJ TDC (AVF works butinfiltrated and not using) -Mircera 75 q 2 wks (last 5/5) No VDRA  Assessment/ Plan:   1. LUEcompartment syndrome:evacuation of hematoma/ repair of bleeding brachial artery pseudoaneurysm on 5/10 per Dr. Cain/VVS. Pain control per PMD. 2. ESRD - HD TTS. HDon schedule  - next for 11/10/20. Use 4.0 K bath. No heparin.Pre HD  RFP; preCBC resulted w/ Hb10.  - she's on for 2nd shift so will miss the early afternoon therapy; o/w she will be exhausted getting RRT late at night.  3. Hypertension/volume - BPvariable but mostly wellcontrolled. No BP meds on home list.Increased volume on exam, per weights 2-3kg over dry, had been lowering as OP as patient had been losing weight.Continue to titrate down as tolerated.  4. Anemia of CKD/ABLA-HGB 9.2 11/03/2020 Rec'd Aranesp 100 mcg IV 11/03/2020.Hgb 6.5 at lowest here post-op.s/p2 unit prbcs 7/86. 5. Metabolic bone disease -CCa 10, phosimproved.No binders or VDRA.Encourage nutrition. 6. Chronic combined CHF -- volume removal with UF on HD  7. PAFib- On Eliquis/amiodarone 8. Nutrition -Renal diet/fluid restriction.Encourage nutrition.Prot supp for low albumin 9. Debility -now in CIR.   Subjective:   She denies f/c/n/v. Tolerating PT.    Objective:   BP (!) 117/58 (BP Location: Right Arm)   Pulse 65   Temp 97.8 F (36.6 C) (Oral)   Resp 18   Wt 74.2 kg   LMP 01/19/1992 (Approximate)   SpO2 98%   BMI 28.98 kg/m   Intake/Output Summary (Last 24 hours) at 11/10/2020 0743 Last data filed at 11/10/2020 0700 Gross per 24 hour  Intake 780 ml  Output 1 ml  Net 779 ml   Weight change: -0.1 kg  Physical Exam: General:Pleasant elderly female in NAD VEHMC:N4,B0 3/6 systolic M. No R/G. Lungs:CTAB Abdomen:Active BS, NT Extremities:no LE edema Dialysis Access:RIJ TDC  with drsg CDI. Lt AVF +T/B, wrapped in multiple layers of gauze, strong thrill at the inflow  Imaging: No results found.  Labs: BMET Recent Labs  Lab 11/03/20 0843  NA 133*  K 3.7  CL 98  CO2 27  GLUCOSE 105*  BUN 35*  CREATININE 5.14*  CALCIUM 8.2*  PHOS 3.0   CBC Recent Labs  Lab 11/09/20 0948  WBC 10.5  NEUTROABS 8.5*  HGB 10.0*  HCT 32.1*  MCV 99.1  PLT 270    Medications:    . amiodarone  200 mg Oral Daily  . apixaban  2.5 mg Oral BID  . cephALEXin  250 mg Oral q1800  . Chlorhexidine Gluconate Cloth  6 each Topical Q0600  . darbepoetin (ARANESP) injection - DIALYSIS  100 mcg Intravenous Q Thu-HD  . diclofenac Sodium  2 g Topical QID  . famotidine  20 mg Oral Daily  . feeding supplement (NEPRO CARB STEADY)  237 mL Oral BID BM  . gabapentin  300 mg Oral QHS  . latanoprost  1 drop Both Eyes QHS  . lidocaine-prilocaine  1 application Topical Once per day on Mon Wed Fri  . multivitamin  1 tablet Oral QHS  . polycarbophil  625 mg Oral BID AC  . saccharomyces boulardii  250 mg Oral BID  . timolol  1 drop Both Eyes Daily      Otelia Santee, MD 11/10/2020, 7:43 AM

## 2020-11-11 DIAGNOSIS — M79602 Pain in left arm: Secondary | ICD-10-CM | POA: Insufficient documentation

## 2020-11-11 NOTE — Progress Notes (Signed)
Physical Therapy Session Note  Patient Details  Name: [name] MRN: [id number] Date of Birth: Jan 08, 1946  Today's Date: 11/11/2020 PT Individual Time: 1630-1715 PT Individual Time Calculation (min): 45 min   Short Term Goals: Week 2:  PT Short Term Goal 1 (Week 2): Patient to perform sit to stand with mod A from elevated height consistently. PT Short Term Goal 2 (Week 2): Patient will perform w/c mobility with S x 60'. PT Short Term Goal 3 (Week 2): Patient wil ambulated with RW and mod A x 15' (+2 for w/c follow if needed) PT Short Term Goal 4 (Week 2): Patient to perform slide board transfers with mod A at least 50% of the time.  Skilled Therapeutic Interventions/Progress Updates:   Pt received supine in bed and agreeable to PT. Supine>sit transfer with mod assist and cues for improved reciprocal scooting pattern to EOB. SB transfer to [facility] with mod assist and +2 present for safety. Pt transported to rehab gym in [facility]. Sit<>stand in parallel bars x 2 with mod-max assist from PT to facilitate anterior weight shift. Min assist to sustain balance once in standing. Pt performed pregait stepping in parallel bars 2 x 5 BLE with cues for posture and BUE support on rails for safety. Kinetron BLE reciprocal movement/cardiovascular enurance training 1 min x2 with prolonged rest break between bouts. Min assist on second bout due to BLE fatigue. Pt returned to room and performed SB transfer to bed with mod-max assist. Sit>supine completed with Mod assist at BLE and left supine in bed with call bell in reach and all needs met.     Therapy Documentation Precautions:  Precautions Precautions: Fall Precaution Comments: L UE fistula with upper arm wound and pain Restrictions Weight Bearing Restrictions: No Pain: Pain Assessment Pain Scale: 0-10 Pain Score: 7  Pain Type: Acute pain Pain Location: Arm Pain Orientation: Left Pain Descriptors / Indicators: Aching;Throbbing Pain Frequency:  Intermittent Pain Onset: On-going Pain Intervention(s): Medication (See eMAR)    Therapy/Group: Individual Therapy  [name] 11/11/2020, 5:45 PM

## 2020-11-11 NOTE — Progress Notes (Signed)
Removed dressing from left forearm, skin tear noted, cleansed and placed vaseline gause, dry dressing and Tegaderm.  Reports was done during surgery to left upper arm.

## 2020-11-11 NOTE — Progress Notes (Signed)
Patient ID: [name], female   DOB: 02/02/1946, 75 y.o.   MRN: [id number]  This SW covering for primary SW, Becky Dupree.   SW made efforts to meet with pt to provide updates from team conference, but pt in therapy. SW will continue to make efforts.   11/12/2020-SW made efforts to meet with pt to provide updates from team conference, but pt off floor due to dialysis. SW will continue to make efforts.   Loralee Pacas, MSW, [place] Office: [phone] Cell: [phone] Fax: [phone]

## 2020-11-11 NOTE — Progress Notes (Signed)
Nutrition Follow-up  DOCUMENTATION CODES:   Not applicable  INTERVENTION:  Continue Nepro Shake po BID, each supplement provides 425 kcal and 19 grams protein  Encourage adequate PO intake.   NUTRITION DIAGNOSIS:   Increased nutrient needs related to chronic illness (ESRD HD) as evidenced by estimated needs; ongoing  GOAL:   Patient will meet greater than or equal to 90% of their needs; progressing  MONITOR:   PO intake,Supplement acceptance,Skin,Weight trends,Labs,I & O's  REASON FOR ASSESSMENT:   Malnutrition Screening Tool    ASSESSMENT:   75 year old female with history of ESRD- HD TTS, HTN, PAF, glaucoma, OA, aortic valve stenosis, initially presents with large intramuscular biceps hematoma with concerns of compartment syndrome and was taken to OR on 10/27/20 for evacuation of hematoma and primary repair of left brachial artery pseudoaneurysm. Therapy ongoing and patient noted to have deficits due to LUE pain as well as  BLE weakness with instability. Pt with functional deficits due to myopathy/B/L foot drop. CIR due to functional decline.  Meal completion has been 80-100%. Pt currently has Nepro shake ordered with varied consumption. RD to continue with current orders to aid in caloric and protein needs. Pt has been tolerating her PO. Labs and medications reviewed. Weight up significantly from EDW. Plans for HD tomorrow.   Diet Order:   Diet Order            Diet renal with fluid restriction Fluid restriction: 1200 mL Fluid; Room service appropriate? Yes; Fluid consistency: Thin  Diet effective now                 EDUCATION NEEDS:   Not appropriate for education at this time  Skin:  Skin Assessment: Reviewed RN Assessment  Last BM:  5/22  Height:   Ht Readings from Last 1 Encounters:  10/26/20 5\' 3"  (1.6 m)    Weight:   Wt Readings from Last 1 Encounters:  11/10/20 77.8 kg   BMI:  Body mass index is 30.38 kg/m.  Estimated Nutritional Needs:    Kcal:  1850-2050  Protein:  85-100 grams  Fluid:  1.2 L/day  Corrin Parker, MS, RD, LDN RD pager number/after hours weekend pager number on Amion.

## 2020-11-11 NOTE — Progress Notes (Signed)
Occupational Therapy Session Note  Patient Details  Name: [name] MRN: [id number] Date of Birth: Sep 04, 1945  Today's Date: 11/11/2020 OT Individual Time: 1000-1100 OT Individual Time Calculation (min): 60 min    Short Term Goals: Week 1:  OT Short Term Goal 1 (Week 1): Pt will complete UB dressing and bathing with supervision EOB or from wheelchair. OT Short Term Goal 2 (Week 1): Pt will complete LB bathing with max assist sit to stand for two consecutive sessions. OT Short Term Goal 3 (Week 1): Pt will donn pants with max assist sit to stand OT Short Term Goal 4 (Week 1): Pt will complete stand pivot transfer to the 3:1 with mod assist using the RW for support. OT Short Term Goal 5 (Week 1): Pt will use the LUE to wash the RUE with supervision and min instructional cueing for initiation.  Skilled Therapeutic Interventions/Progress Updates:    Pt received supine in bed, agreeable to OT session, with unrated pain in LUE. Pt engaged in LB bathing/dressing bed level with max-total A to don footwear, pants, and wash lower portion of LLE. Pt reached to wash RLE and doff R sock in figure 4 technique with HOB slightly elevated. Pt incontinent of bowels and toileting completed bed level with 2 helpers total A; min A for rolling L and R with use of bed rails. Sup>sit mod A. Slide board transfer bed>w/c max A +1 with mod vc's for anterior weight shift. Pt engaged in UB bathing min A, UB dressing mod A to don new pullover shirt, and grooming tasks (brushing teeth, combing hair) set up A seated in w/c. Pt engaged in LUE fine motor tasks including 9 hole peg test (see below) and use of theraputty (pinch grasp, whole hand grasp, and rolling with hand in EXT) for conditioning and maintaining ROM in preparation for BADLs. Pt demo'd decreased speed and grasp in L hand compared to R and reported numbness in digits 1-3 in radial nerve distribution. Sharp/dull and proprioception tested in L hand confirming  limitations in both sensations in digits 1-3. Pt education on safety with decreased sensation but will need continued reinforcement. Pt left seated in w/c, alarm set, call bell in reach, and all needs met.   9 Hole Peg Test: R hand: 36 seconds L hand: 2 minutes 3 seconds  Therapy Documentation Precautions:  Precautions Precautions: Fall Precaution Comments: L UE fistula with upper arm wound and pain Restrictions Weight Bearing Restrictions: No   Pain: Pain Assessment Pain Scale: 0-10 Pain Score:  (unrated) Pain Type: Acute pain Pain Location: Arm Pain Orientation: Left Pain Descriptors / Indicators: Aching;Discomfort Pain Frequency: Intermittent Pain Onset: On-going Pain Intervention(s): Rest;Repositioned;Distraction   Therapy/Group: Individual Therapy  [name] 11/11/2020, 11:14 AM

## 2020-11-11 NOTE — Progress Notes (Signed)
[place] PHYSICAL MEDICINE & REHABILITATION PROGRESS NOTE  Subjective/Complaints: Patient seen sitting up in bed this morning.  She states she slept well overnight.  She is apprehensive about setting a discharge date.  She is seen by nephrology yesterday, notes reviewed- no changes.  ROS: Denies CP, SOB, N/V/D  Objective: Vital Signs: Blood pressure (!) 112/51, pulse 65, temperature (!) 97.4 F (36.3 C), temperature source Oral, resp. rate 17, weight 77.8 kg, last menstrual period 01/19/1992, SpO2 98 %. No results found. Recent Labs    11/09/20 0948 11/10/20 1336  WBC 10.5 12.4*  HGB 10.0* 9.5*  HCT 32.1* 30.3*  PLT 270 250   Recent Labs    11/10/20 1336  NA 133*  K 4.1  CL 99  CO2 25  GLUCOSE 116*  BUN 26*  CREATININE 4.89*  CALCIUM 7.9*    Intake/Output Summary (Last 24 hours) at 11/11/2020 1024 Last data filed at 11/11/2020 0700 Gross per 24 hour  Intake 240 ml  Output 2000 ml  Net -1760 ml        Physical Exam: BP (!) 112/51 (BP Location: Right Arm)   Pulse 65   Temp (!) 97.4 F (36.3 C) (Oral)   Resp 17   Wt 77.8 kg   LMP 01/19/1992 (Approximate)   SpO2 98%   BMI 30.38 kg/m  Constitutional: No distress . Vital signs reviewed. HENT: Normocephalic.  Atraumatic. Eyes: EOMI. No discharge. Cardiovascular: No JVD.  RRR. Respiratory: Normal effort.  No stridor.  Bilateral clear to auscultation. GI: Non-distended.  BS +. Skin: Warm and dry.   Right chest wall catheter. Left upper extremity with dressing CDI Psych: Flat.  Normal behavior. Musc: Left upper extremity with edema and tenderness, Stable Left lower extreme edema, unchanged Neuro: Alert Motor: RUE: 4-/5 proximal to distal LUE: 3/5 proximal to distal pain inhibition B/l LE: HF, KE, ADF 3-/5, stable  Assessment/Plan: 1. Functional deficits which require 3+ hours per day of interdisciplinary therapy in a comprehensive inpatient rehab setting.  Physiatrist is providing close team  supervision and 24 hour management of active medical problems listed below.  Physiatrist and rehab team continue to assess barriers to discharge/monitor patient progress toward functional and medical goals   Care Tool:  Bathing    Body parts bathed by patient: Left arm,Abdomen,Chest,Right upper leg,Left upper leg,Front perineal area,Face   Body parts bathed by helper: Buttocks,Right arm     Bathing assist Assist Level: Moderate Assistance - Patient 50 - 74%     Upper Body Dressing/Undressing Upper body dressing   What is the patient wearing?: Pull over shirt,Bra    Upper body assist Assist Level: Moderate Assistance - Patient 50 - 74% (mod for bra and to doff paper scrub shirt)    Lower Body Dressing/Undressing Lower body dressing      What is the patient wearing?: Pants     Lower body assist Assist for lower body dressing: Maximal Assistance - Patient 25 - 49%     Toileting Toileting    Toileting assist Assist for toileting: Total Assistance - Patient < 25%     Transfers Chair/bed transfer  Transfers assist  Chair/bed transfer activity did not occur:  (sliding board)  Chair/bed transfer assist level: 2 Helpers Chair/bed transfer assistive device: Sliding board   Locomotion Ambulation   Ambulation assist   Ambulation activity did not occur: Safety/medical concerns  Assist level: 2 helpers Assistive device: Ethelene Hal Max distance: 8'   Walk 10 feet activity   Assist  Walk  10 feet activity did not occur: Safety/medical concerns        Walk 50 feet activity   Assist Walk 50 feet with 2 turns activity did not occur: Safety/medical concerns         Walk 150 feet activity   Assist Walk 150 feet activity did not occur: Safety/medical concerns         Walk 10 feet on uneven surface  activity   Assist Walk 10 feet on uneven surfaces activity did not occur: Safety/medical concerns         Wheelchair     Assist Will patient  use wheelchair at discharge?: Yes Type of Wheelchair: Manual    Wheelchair assist level: Minimal Assistance - Patient > 75% Max wheelchair distance: 30'    Wheelchair 50 feet with 2 turns activity    Assist        Assist Level: Minimal Assistance - Patient > 75%   Wheelchair 150 feet activity     Assist      Assist Level: Dependent - Patient 0%    Medical Problem List and Plan: 1.  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 Please see conference note from today as well.  2.  Antithrombotics: -DVT/anticoagulation:  Pharmaceutical: Other (comment)--renal dose Eliquis.              -antiplatelet therapy: N/A 3. Pain Management: Oxycodone prn for severe pain. Tramadol added prn for moderate pain.              Added low dose gabapentin for neuropathic pain   Relatively controlled with meds on 5/25  Monitor with increased activity.  4. Mood: LCSW to follow for evaluation and support.              -antipsychotic agents: N/A 5. Neuropsych: This patient is capable of making decisions on his own behalf. 6. Skin/Wound Care:  Routine pressure relief measures.  7. Fluids/Electrolytes/Nutrition: Strict I/Os with daily weights. Renal diet/1200 cc FR.  8. Hematoma LUE s/p evacuation: Continue to monitor pulse and for symptoms. Also elevate LUE 9. Acute on chronic anemia: Improved post transfusion. Continue aransep weekly.   Hb 9.5 on 5/24 10. PAF: Continue amiodarone and Eliquis.   Controlled on 5/25, monitor TID  Monitor with increased exertion 11. ESRD: HD on TTS after therapy to help with tolerance of therapy.  12. Chronic UTI: On low dose Keflex for suppression.  13. B/L foot drop-  Prevalon boots for moderate B/L foot drop 14. Leukocytosis  WBCs 12.4 on 5/24, tends to fluctuate  Afebrile  Cont ro monitor 15.  Loose stools  Fiber added on 5/20   Improving  LOS: 8 days A FACE TO FACE EVALUATION WAS PERFORMED  [name] [name] 11/11/2020, 10:24 AM

## 2020-11-11 NOTE — Progress Notes (Addendum)
  [facility] Progress Note   Dialysis Orders: [facility] TTS  4h 400/500 71kg 2/2 bathP2 Hep none RIJ TDC (AVF works butinfiltrated and not using) -Mircera 75 q 2 wks (last 5/5) No VDRA  Assessment/ Plan:   1. LUEcompartment syndrome:evacuation of hematoma/ repair of bleeding brachial artery pseudoaneurysm on 5/10 per Dr. Cain/VVS. Pain control per PMD. 2. ESRD - HD TTS. HDon schedule  Tolerated hd on 11/10/20 with net 2L UF  - next for 11/12/20; will try for a little UF as tolerated given edema up the thighs.   Up signif from EDW ->  Daily AM floor scale weights and will try for 3 L as tolerated tomorrow.   3. Hypertension/volume - BPvariable but mostly wellcontrolled. No BP meds on home list.Increased volume on exam, per weights she is over  Dry weight.Continue to titrate down as tolerated.  4. Anemia of CKD/ABLA-HGB 9.2 11/03/2020 Rec'd Aranesp 100 mcg IV 11/03/2020.Hgb 6.5 at lowest here post-op.s/p2 unit prbcs 5/95. 5. Metabolic bone disease -CCa 10, phosimproved.No binders or VDRA.Encourage nutrition. 6. Chronic combined CHF -- volume removal with UF on HD  7. PAFib- On Eliquis/amiodarone 8. Nutrition -Renal diet/fluid restriction.Encourage nutrition.Prot supp for low albumin 9. Debility -now in CIR.   Subjective:   She denies f/c/n/v. Tolerating PT. Denies any cramping or lightheadedness on dialysis yesterday   Objective:   BP (!) 112/51 (BP Location: Right Arm)   Pulse 65   Temp (!) 97.4 F (36.3 C) (Oral)   Resp 17   Wt 77.8 kg   LMP 01/19/1992 (Approximate)   SpO2 98%   BMI 30.38 kg/m   Intake/Output Summary (Last 24 hours) at 11/11/2020 6387 Last data filed at 11/11/2020 0700 Gross per 24 hour  Intake 240 ml  Output 2000 ml  Net -1760 ml   Weight change: 5.5 kg  Physical Exam: General:Pleasant elderly female in NAD FIEPP:I9,J1 3/6 systolic M. No R/G. Lungs:CTAB Abdomen:Active BS,  NT Extremities:no LE edema Dialysis Access:RIJ TDC with drsg CDI. Lt AVF +T/B, wrapped in multiple layers of gauze, strong thrill at the inflow  Imaging: No results found.  Labs: BMET Recent Labs  Lab 11/10/20 1336  NA 133*  K 4.1  CL 99  CO2 25  GLUCOSE 116*  BUN 26*  CREATININE 4.89*  CALCIUM 7.9*  PHOS 2.4*   CBC Recent Labs  Lab 11/09/20 0948 11/10/20 1336  WBC 10.5 12.4*  NEUTROABS 8.5*  --   HGB 10.0* 9.5*  HCT 32.1* 30.3*  MCV 99.1 99.0  PLT 270 250    Medications:    . amiodarone  200 mg Oral Daily  . apixaban  2.5 mg Oral BID  . cephALEXin  250 mg Oral q1800  . Chlorhexidine Gluconate Cloth  6 each Topical Q0600  . darbepoetin (ARANESP) injection - DIALYSIS  100 mcg Intravenous Q Thu-HD  . diclofenac Sodium  2 g Topical QID  . famotidine  20 mg Oral Daily  . feeding supplement (NEPRO CARB STEADY)  237 mL Oral BID BM  . gabapentin  300 mg Oral QHS  . latanoprost  1 drop Both Eyes QHS  . lidocaine-prilocaine  1 application Topical Once per day on Mon Wed Fri  . multivitamin  1 tablet Oral QHS  . polycarbophil  625 mg Oral BID AC  . saccharomyces boulardii  250 mg Oral BID  . timolol  1 drop Both Eyes Daily      Otelia Santee, MD 11/11/2020, 8:42 AM

## 2020-11-11 NOTE — Patient Care Conference (Signed)
Inpatient RehabilitationTeam Conference and Plan of Care Update Date: 11/11/2020   Time: 11:47 AM    Patient Name: [name]      Medical Record Number: [id number]  Date of Birth: 1945/08/04 Sex: Female         Room/Bed: 4W02C/4W02C-01 Payor Info: Payor: [place] / Plan: [employer] MEDICARE / Product Type: *No Product type* /    Admit Date/Time:  11/03/2020  1:38 PM  Primary Diagnosis:  Intensive care (ICU) myopathy  Hospital Problems: Principal Problem:   Intensive care (ICU) myopathy Active Problems:   Debility   ESRD on dialysis ([place])   Acute on chronic anemia   Post-operative pain   Chronic UTI   Loose stools    Expected Discharge Date: Expected Discharge Date: 12/02/20  Team Members Present: Physician leading conference: Dr. Delice Lesch Care Coodinator Present: Dorien Chihuahua, RN, BSN, CRRN;Loralee Pacas, [place] Nurse Present: Dorien Chihuahua, RN PT Present: Magda Kiel, PT OT Present: Other (comment) Hulda Marin, OT) PPS Coordinator present : Gunnar Fusi, SLP     Current Status/Progress Goal Weekly Team Focus  Bowel/Bladder   patient still incontinent of B/B Last BM 5/22  Patient will regain functional continence  regular toileting   Swallow/Nutrition/ Hydration             ADL's   mod A bed mobility, max A/occasional +2 for transfers (slide board), UB adls min A, LB adls max-total A, toileting max A  min A  adl training, functional transfers, general conditioning, tolerance to OOB, endurance, DME/AE   Mobility   mod A supine<>sit, mod A +2 to max A slide board transfer w/c <>bed; max A sit to stand to RW vs [place] walker, ambulated 8' with EVA walker with w/c follow.  Sitting balance close S, w.c propulsiton 30' with feet and UE's with min A for L UE.  CGA/min A w/c level  upright tolerance in walker, standing frame, sara plus, attempts at gait, w/c mobility, LE strength, sitting balance   Communication             Safety/Cognition/  Behavioral Observations            Pain   Pain complains of pain on her left arm and back last night, relieved by Tylenol and Oxycodone IR 5mg   Patient pain will be 2/10 at most  assess pain q shift and prn, medicate as necessary, reassess for relief   Skin   Patient has abrasions and ecchymosis on left rib cage and flank, as well as post operative wound on left arm, clean and dry dressing  wound will be free from infection and will have no new skin breakdown  assess skin q shift and prn, assist repositioning in bed     Discharge Planning:  new eval plan to discharge home with daughter whom she lives with, will need to confirm 24/7 care. Daughter does run a business   Team Discussion: Progression is slow. Note knee buckling with transfers and decreased sensation left UE digits.  Patient on target to meet rehab goals: Currently max assist for sit - stand. Requires mod - max assist for slide-board transfers. Able to ambulate 64' with a eva walker. Requires min - mod assist for upper body care and max- total assist for lower body care and toileting. Min assist goals set for discharge.  *See Care Plan and progress notes for long and short-term goals.   Revisions to Treatment Plan:  Working on sitting tolerance and  upright tolerance with balance  Teaching Needs: Transfers, transfers, medications, etc.   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 secondary to prolonged hospitalization/recent hospitalization for LUE thrombus s/p thrombectomy  Barriers to Discharge: Medical stability;Incontinence   Possible Resolutions to [employer] Focus: Therapies, optimize pain meds, follow labs - WBCs, abx for chronic UTI   Continued Need for Acute Rehabilitation Level of Care: The patient requires  daily medical management by a physician with specialized training in physical medicine and rehabilitation for the following reasons: Direction of a multidisciplinary physical rehabilitation program to maximize functional independence : Yes Medical management of patient stability for increased activity during participation in an intensive rehabilitation regime.: Yes Analysis of laboratory values and/or radiology reports with any subsequent need for medication adjustment and/or medical intervention. : Yes   I attest that I was present, lead the team conference, and concur with the assessment and plan of the team.   Dorien Chihuahua B 11/11/2020, 2:57 PM

## 2020-11-11 NOTE — Progress Notes (Signed)
Occupational Therapy Weekly Progress Note  Patient Details  Name: [name] MRN: [id number] Date of Birth: [birth year]  Beginning of progress report period: Nov 04, 2020 End of progress report period: Nov 11, 2020  Patient has met 0 of 5 short term goals.  Patient is progressing towards or partially met 4 of 5 short term goals, but continues to be limited by deconditioning, limited mobility/sensation in LUE, endurance, functional mobility, and tolerance to OOB activities. Pt demo's good motivation to participate in therapies, but has required more assistance and made slower progress this week than short term goal levels.  Patient continues to demonstrate the following deficits: muscle weakness and muscle joint tightness, decreased cardiorespiratoy endurance and decreased sitting balance, decreased standing balance, decreased postural control and decreased balance strategies and therefore will continue to benefit from skilled OT intervention to enhance overall performance with BADL and Reduce care partner burden.  Patient progressing toward long term goals..  Continue plan of care.  OT Short Term Goals Week 1:  OT Short Term Goal 1 (Week 1): Pt will complete UB dressing and bathing with supervision EOB or from wheelchair. OT Short Term Goal 1 - Progress (Week 1): Progressing toward goal OT Short Term Goal 2 (Week 1): Pt will complete LB bathing with max assist sit to stand for two consecutive sessions. OT Short Term Goal 2 - Progress (Week 1): Partly met OT Short Term Goal 3 (Week 1): Pt will donn pants with max assist sit to stand OT Short Term Goal 3 - Progress (Week 1): Partly met OT Short Term Goal 4 (Week 1): Pt will complete stand pivot transfer to the 3:1 with mod assist using the RW for support. OT Short Term Goal 4 - Progress (Week 1): Not met OT Short Term Goal 5 (Week 1): Pt will use the LUE to wash the RUE with supervision and min instructional cueing for initiation. OT Short  Term Goal 5 - Progress (Week 1): Progressing toward goal Week 2:  OT Short Term Goal 1 (Week 2): Patient will complete UB bathing/dressing set up A from w/c or EOB. OT Short Term Goal 2 (Week 2): Patient will complete LB bathing mod A using AE PRN. OT Short Term Goal 3 (Week 2): Patient will complete LB dressing max A seated/in stance. OT Short Term Goal 4 (Week 2): Patient will complete toilet transfer using LRAD with mod A. OT Short Term Goal 5 (Week 2): Patient will complete ADL task using LUE to assist with spvsn and min vc's for initiation.   Therapy Documentation Precautions:  Precautions Precautions: Fall Precaution Comments: L UE fistula with upper arm wound and pain Restrictions Weight Bearing Restrictions: No   [name] 11/11/2020, 1:34 PM

## 2020-11-11 NOTE — Progress Notes (Signed)
Physical Therapy Weekly Progress Note  Patient Details  Name: DICY SMIGEL MRN: [id number] Date of Birth: [birth year]  Beginning of progress report period: Nov 04, 2020 End of progress report period: Nov 11, 2020  Today's Date: 11/11/2020 PT Individual Time: 1435-1535 PT Individual Time Calculation (min): 60 min   Patient has met 2 of 4 short term goals.  Patient progressing toward all goals.  She ambulated today with RW and mod A x 8' with max cues to turn and sit on mat.  She is progressing to transfers today able to complete 4 sit to stands with mod to mostly max A.  She also is able to sit with S during simple tasks.  She still needs mod A for supine <>sit.  She is progressing though slowly and has missed one session due to dialysis.    Patient continues to demonstrate the following deficits muscle weakness and L arm pain and decreased sitting balance, decreased standing balance and decreased postural control and therefore will continue to benefit from skilled PT intervention to increase functional independence with mobility.  Patient progressing toward long term goals..  Continue plan of care.  PT Short Term Goals Week 1:  PT Short Term Goal 1 (Week 1): Patient will perform supine to sit with min A and mod cues. PT Short Term Goal 1 - Progress (Week 1): Progressing toward goal PT Short Term Goal 2 (Week 1): Patient will demonstrate sitting balance with S during simple functional task. PT Short Term Goal 2 - Progress (Week 1): Met PT Short Term Goal 3 (Week 1): Patient will perform sit to stand with mod A at least 50% of attempts. PT Short Term Goal 3 - Progress (Week 1): Progressing toward goal PT Short Term Goal 4 (Week 1): Patient will initiate ambulation. PT Short Term Goal 4 - Progress (Week 1): Met Week 2:  PT Short Term Goal 1 (Week 2): Patient to perform sit to stand with mod A from elevated height consistently. PT Short Term Goal 2 (Week 2): Patient will perform w/c mobility  with S x 60'. PT Short Term Goal 3 (Week 2): Patient wil ambulated with RW and mod A x 15' (+2 for w/c follow if needed) PT Short Term Goal 4 (Week 2): Patient to perform slide board transfers with mod A at least 50% of the time.  Skilled Therapeutic Interventions/Progress Updates:  Patient in supine and reports a little off due to schedule she read initially not from today.  Patient supine to sit with mod A for LE's and trunk.  Patient sit to stand from elevated mat with mod A.  Stand step to w/c with RW and mod/max A.  Patient in w/c assisted to therapy gym.  Sit to stand to RW and ambulated x 8' with RW and mod/max A with +2 for w/c follow for safety.  Patient at [facility] sit <> stand from elevated height x 3 with max A to +2 increased level of assist due to LE fatigue.  Patient performed sitting trunk extensions with gait belt at pelvis with 10 sec holds x 10.  LAQ and hip flexion x 10.  Seated EOM using pedal exerciser on table for UBE x 1 minute forward, then 1 minute back for trunk and UE strengthening.  Patient transferred to w/c via slide board with +2 mod A.  Assisted to room in w/c.  Transfer to bed via slide board and sit to supine mod A for LE's.  Doffed pants with total  A and completed hygiene and brief change with total A due to incontinent of bowel.  Patient rolling with min A.  Donned pants total A and left in supine with bed alarm active and needs/call bell in reach.  Ambulation/gait training;DME/adaptive equipment instruction;Neuromuscular re-education;Psychosocial support;UE/LE Strength taining/ROM;Wheelchair propulsion/positioning;UE/LE Coordination activities;Therapeutic Activities;Skin care/wound management;Pain management;Functional electrical stimulation;Discharge planning;Balance/vestibular training;Functional mobility training;Therapeutic Exercise;Patient/family education;Splinting/orthotics   Therapy Documentation Precautions:  Precautions Precautions: Fall Precaution Comments: L  UE fistula with upper arm wound and pain Restrictions Weight Bearing Restrictions: No Pain: Pain Assessment Pain Scale: 0-10 Pain Score: 7  Pain Type: Acute pain Pain Location: Arm Pain Orientation: Left Pain Descriptors / Indicators: Aching;Throbbing Pain Frequency: Intermittent Pain Onset: On-going Pain Intervention(s): Medication (See eMAR)  Therapy/Group: Individual Therapy  Reginia Naas  Magda Kiel, PT 11/11/2020, 5:14 PM

## 2020-11-12 LAB — RENAL FUNCTION PANEL
Albumin: 1.7 g/dL — ABNORMAL LOW (ref 3.5–5.0)
Anion gap: 8 (ref 5–15)
BUN: 24 mg/dL — ABNORMAL HIGH (ref 8–23)
CO2: 27 mmol/L (ref 22–32)
Calcium: 7.8 mg/dL — ABNORMAL LOW (ref 8.9–10.3)
Chloride: 98 mmol/L (ref 98–111)
Creatinine, Ser: 4.52 mg/dL — ABNORMAL HIGH (ref 0.44–1.00)
GFR, Estimated: 10 mL/min — ABNORMAL LOW (ref >60)
Glucose, Bld: 89 mg/dL (ref 70–99)
Phosphorus: 2.6 mg/dL (ref 2.5–4.6)
Potassium: 4.1 mmol/L (ref 3.5–5.1)
Sodium: 133 mmol/L — ABNORMAL LOW (ref 135–145)

## 2020-11-12 LAB — CBC
HCT: 29.9 % — ABNORMAL LOW (ref 36.0–46.0)
Hemoglobin: 9.6 g/dL — ABNORMAL LOW (ref 12.0–15.0)
MCH: 31.5 pg (ref 26.0–34.0)
MCHC: 32.1 g/dL (ref 30.0–36.0)
MCV: 98 fL (ref 80.0–100.0)
Platelets: 213 10*3/uL (ref 150–400)
RBC: 3.05 MIL/uL — ABNORMAL LOW (ref 3.87–5.11)
RDW: 23.4 % — ABNORMAL HIGH (ref 11.5–15.5)
WBC: 10.2 10*3/uL (ref 4.0–10.5)
nRBC: 0 % (ref 0.0–0.2)

## 2020-11-12 MED ORDER — DARBEPOETIN ALFA 100 MCG/0.5ML IJ SOSY
PREFILLED_SYRINGE | INTRAMUSCULAR | Status: AC
  Administered 2020-11-12: 100 ug
  Filled 2020-11-12: qty 0.5

## 2020-11-12 MED ORDER — HEPARIN SODIUM (PORCINE) 1000 UNIT/ML IJ SOLN
INTRAMUSCULAR | Status: AC
  Administered 2020-11-12: 3200 [IU]
  Filled 2020-11-12: qty 4

## 2020-11-12 NOTE — Progress Notes (Signed)
Occupational Therapy Session Note  Patient Details  Name: [name] MRN: [id number] Date of Birth: 1945/07/13  Today's Date: 11/12/2020 OT Individual Time: 1007-1219 OT Individual Time Calculation (min): 60 min    Short Term Goals: Week 2:  OT Short Term Goal 1 (Week 2): Patient will complete UB bathing/dressing set up A from w/c or EOB. OT Short Term Goal 2 (Week 2): Patient will complete LB bathing mod A using AE PRN. OT Short Term Goal 3 (Week 2): Patient will complete LB dressing max A seated/in stance. OT Short Term Goal 4 (Week 2): Patient will complete toilet transfer using LRAD with mod A. OT Short Term Goal 5 (Week 2): Patient will complete ADL task using LUE to assist with spvsn and min vc's for initiation.  Skilled Therapeutic Interventions/Progress Updates:    Pt received seated in w/c, agreeable to OT session, reporting no pain. Pt taken to therapy gym to work on unsupported sitting balance tasks, posture and tolerance to OOB, and FM strengthening in preparation for increased independence in BADLs and functional mobility. Slideboard transfer w/c<>therapy mat max A +1 with vc's for body mechanics, anterior weight shift, and leg placement. Pt engaged in FM HEP using low resistance putty and handouts with mod vc's and demo's for technique. Pt reported continued numbness in digits 1-3 in L hand and difficulty using pinch grasp. Pt engaged in postural control, lateral leans, and anterior/posterior leans to improve ROM, strength, and dynamic balance. Pt engaged in reciprocal scooting L and R with mod vc's and mod-max A intermittently to complete. Slide board transfer w/c>bed max A +1; sit>sup max A. Pt positioned with pillows under BUEs and left supine with alarm set, all needs met and call bell in reach.  Therapy Documentation Precautions:  Precautions Precautions: Fall Precaution Comments: L UE fistula with upper arm wound and pain Restrictions Weight Bearing Restrictions:  No General:   Vital Signs:  Pain: Pain Assessment Pain Scale: 0-10 Pain Score: 0-No pain Faces Pain Scale: Hurts a little bit Pain Type: Acute pain Pain Location: Arm Pain Orientation: Left Pain Descriptors / Indicators: Sore Pain Frequency: Intermittent Pain Onset: On-going Patients Stated Pain Goal: 2 Pain Intervention(s): Repositioned;Rest Multiple Pain Sites: No   Therapy/Group: Individual Therapy  [name] 11/12/2020, 12:53 PM

## 2020-11-12 NOTE — Progress Notes (Signed)
Physical Therapy Session Note  Patient Details  Name: [name] MRN: [id number] Date of Birth: 06/15/1946  Today's Date: 11/12/2020 PT Individual Time: 1045-1130 PT Individual Time Calculation (min): 45 min   Short Term Goals: Week 1:  PT Short Term Goal 1 (Week 1): Patient will perform supine to sit with min A and mod cues. PT Short Term Goal 1 - Progress (Week 1): Progressing toward goal PT Short Term Goal 2 (Week 1): Patient will demonstrate sitting balance with S during simple functional task. PT Short Term Goal 2 - Progress (Week 1): Met PT Short Term Goal 3 (Week 1): Patient will perform sit to stand with mod A at least 50% of attempts. PT Short Term Goal 3 - Progress (Week 1): Progressing toward goal PT Short Term Goal 4 (Week 1): Patient will initiate ambulation. PT Short Term Goal 4 - Progress (Week 1): Met Week 2:  PT Short Term Goal 1 (Week 2): Patient to perform sit to stand with mod A from elevated height consistently. PT Short Term Goal 2 (Week 2): Patient will perform w/c mobility with S x 60'. PT Short Term Goal 3 (Week 2): Patient wil ambulated with RW and mod A x 15' (+2 for w/c follow if needed) PT Short Term Goal 4 (Week 2): Patient to perform slide board transfers with mod A at least 50% of the time. Week 3:     Skilled Therapeutic Interventions/Progress Updates:    Pain:  Pt reports no pain at rest, experiences R knee pain w/standing.  Treatment to tolerance.  Rest breaks and repositioning as needed.  Pt initially supine and reluctant to getting out of bed.  "I didn't know you were coming, I just got back in bed for dialysis.  Discussed planned session  and agreeable to treatment session w/focus on standing in parallel bars, transfers including back to bed in prep for Dialysis.  Supine to sit w/mod assist, additional time.   sliding board transfer bed to wc w/max assist, cues for body mechanics, hand and foot placement/adjustments. Pt scoots in wc w/legs on  footrests w/cues only. Pt transported to gym. Sit to stand in parallel bars from wc using bars to pull up w/max assist, cues for ant wt shift w/transition. Stood approx 1min, 45sec, 45 sec w/min assist W/tactile and visual cues/mirror to achieve upright, tends to stand w/COM post to COG.  Standing time limited by R knee crepitice/pain.  Pt transported back to room.  wc to bed sliding board transfer w/heavy mod assist, very slow progression across board, cues for wt shifting w/much cuing to maximize ant wt shift/head hips relationship.    Sit to supine w/max assist.  Repositioned w/max assist. Pt left supine w/rails up x 3, alarm set, bed in lowest position, and needs in reach.  Therapy Documentation Precautions:  Precautions Precautions: Fall Precaution Comments: L UE fistula with upper arm wound and pain Restrictions Weight Bearing Restrictions: No  Therapy/Group: Individual Therapy   , PT    M  11/12/2020, 12:44 PM  

## 2020-11-12 NOTE — Progress Notes (Signed)
  [facility] Progress Note   Dialysis Orders: [facility] TTS  4h 400/500 71kg 2/2 bathP2 Hep none RIJ TDC (AVF works butinfiltrated and not using) -Mircera 75 q 2 wks (last 5/5) No VDRA  Assessment/ Plan:   1. LUEcompartment syndrome:evacuation of hematoma/ repair of bleeding brachial artery pseudoaneurysm on 5/10 per Dr. Cain/VVS. Pain control per PMD. 2. ESRD - HD TTS. HDon schedule  Tolerated hd on 11/10/20 with net 2L UF  On  for HD 2nd shift 11/12/20; will try for a little UF as tolerated given edema up the thighs.   Up signif from EDW ->  Daily AM floor scale weights and will try for 3 L as tolerated  3. Hypertension/volume - BPvariable but mostly wellcontrolled. No BP meds on home list.Increased volume on exam, per weights she is over  Dry weight.Continue to titrate down as tolerated.  4. Anemia of CKD/ABLA-HGB 9.2 11/03/2020 Rec'd Aranesp 100 mcg IV 11/03/2020.Hgb 6.5 at lowest here post-op.s/p2 unit prbcs 1/60. 5. Metabolic bone disease -CCa 10, phosimproved.No binders or VDRA.Encourage nutrition. 6. Chronic combined CHF -- volume removal with UF on HD  7. PAFib- On Eliquis/amiodarone 8. Nutrition -Renal diet/fluid restriction.Encourage nutrition.Prot supp for low albumin 9. Debility -now in CIR.   Subjective:   She denies f/c/n/v but has nasal congestion. Tolerating PT.     Objective:   BP (!) 109/48 (BP Location: Right Arm)   Pulse 65   Temp 98.5 F (36.9 C)   Resp 17   Wt 77.8 kg   LMP 01/19/1992 (Approximate)   SpO2 98%   BMI 30.38 kg/m   Intake/Output Summary (Last 24 hours) at 11/12/2020 0753 Last data filed at 11/11/2020 2148 Gross per 24 hour  Intake 657 ml  Output --  Net 657 ml   Weight change:   Physical Exam: General:Pleasant elderly female in NAD FUXNA:T5,T7 3/6 systolic M. No R/G. Lungs:CTAB Abdomen:Active BS, NT Extremities:no LE edema Dialysis Access:RIJ TDC with drsg CDI. Lt AVF  +T/B, wrapped in multiple layers of gauze, strong thrill at the inflow  Imaging: No results found.  Labs: BMET Recent Labs  Lab 11/10/20 1336  NA 133*  K 4.1  CL 99  CO2 25  GLUCOSE 116*  BUN 26*  CREATININE 4.89*  CALCIUM 7.9*  PHOS 2.4*   CBC Recent Labs  Lab 11/09/20 0948 11/10/20 1336  WBC 10.5 12.4*  NEUTROABS 8.5*  --   HGB 10.0* 9.5*  HCT 32.1* 30.3*  MCV 99.1 99.0  PLT 270 250    Medications:    . amiodarone  200 mg Oral Daily  . apixaban  2.5 mg Oral BID  . cephALEXin  250 mg Oral q1800  . Chlorhexidine Gluconate Cloth  6 each Topical Q0600  . darbepoetin (ARANESP) injection - DIALYSIS  100 mcg Intravenous Q Thu-HD  . diclofenac Sodium  2 g Topical QID  . famotidine  20 mg Oral Daily  . feeding supplement (NEPRO CARB STEADY)  237 mL Oral BID BM  . gabapentin  300 mg Oral QHS  . latanoprost  1 drop Both Eyes QHS  . lidocaine-prilocaine  1 application Topical Once per day on Mon Wed Fri  . multivitamin  1 tablet Oral QHS  . polycarbophil  625 mg Oral BID AC  . saccharomyces boulardii  250 mg Oral BID  . timolol  1 drop Both Eyes Daily      Otelia Santee, MD 11/12/2020, 7:53 AM

## 2020-11-12 NOTE — Progress Notes (Signed)
Physical Therapy Session Note  Patient Details  Name: [name] MRN: [id number] Date of Birth: [birth year]  Today's Date: 11/12/2020 PT Individual Time: 0800-0915 PT Individual Time Calculation (min): 75 min   Short Term Goals: Week 2:  PT Short Term Goal 1 (Week 2): Patient to perform sit to stand with mod A from elevated height consistently. PT Short Term Goal 2 (Week 2): Patient will perform w/c mobility with S x 60'. PT Short Term Goal 3 (Week 2): Patient wil ambulated with RW and mod A x 15' (+2 for w/c follow if needed) PT Short Term Goal 4 (Week 2): Patient to perform slide board transfers with mod A at least 50% of the time.  Skilled Therapeutic Interventions/Progress Updates:    Patient in supine and reports stuffiness new this morning, but did tell the doctor.  Reports RN dressed arm but did not place wrap so noted drainage on sheets and her shirt.  Patient rolled for hygiene and changing brief with mod A using rails.  Pants donned supine with max A.  TEDS donned with total A.  Patient supine to sit with mod A.  Patient doffed shirt, then donned clean shirt with mod A.  Patient donned shoes with mod A.    Sit to stand to RW mod A from elevated surface.  Patient stand step to w/c with mod A.  In w/c assisted to therapy gym.  Patient sit to stand at EVA walker with +2 mod A then  standing 3 minutes to play checkers with mod/max A at times for balance with knees buckling.  Patient stood second time about 1 minute prior to needing to sit due to knees buckling.  Slide board transfer to mat with mod A of 2.    Patient performed trunk extension for strengthening x 5 reps 10 sec holds with belt at pelvis.  Patient seated EOM with PT on ball behind pt for trunk support during forward reaching to obtain and toss horse shoes to R then to L with min A.  Patient transferred to w/c via slide board with mod A.  Patient assisted to room and left with alarm belt on , call bell in reach.    Therapy  Documentation Precautions:  Precautions Precautions: Fall Precaution Comments: L UE fistula with upper arm wound and pain Restrictions Weight Bearing Restrictions: No Pain: Pain Assessment Pain Scale: 0-10 Pain Score: 7  Faces Pain Scale: Hurts a little bit Pain Type: Acute pain Pain Location: Arm Pain Orientation: Left Pain Descriptors / Indicators: Sore Pain Frequency: Intermittent Pain Onset: On-going Patients Stated Pain Goal: 2 Pain Intervention(s): Repositioned;Rest Multiple Pain Sites: No   Therapy/Group: Individual Therapy  Reginia Naas  [place], PT 11/12/2020, 12:06 PM

## 2020-11-12 NOTE — Progress Notes (Signed)
[place] PHYSICAL MEDICINE & REHABILITATION PROGRESS NOTE  Subjective/Complaints: Patient seen laying in bed this AM.  She states she slept well overnight.  She believes she is getting stronger. She is unaware of her discharge date. She was seen by Nephro, notes reviewed - UF due to edema.   ROS: Denies CP, SOB, N/V/D  Objective: Vital Signs: Blood pressure (!) 109/48, pulse 65, temperature 98.5 F (36.9 C), resp. rate 17, weight 77.8 kg, last menstrual period 01/19/1992, SpO2 98 %. No results found. Recent Labs    11/09/20 0948 11/10/20 1336  WBC 10.5 12.4*  HGB 10.0* 9.5*  HCT 32.1* 30.3*  PLT 270 250   Recent Labs    11/10/20 1336  NA 133*  K 4.1  CL 99  CO2 25  GLUCOSE 116*  BUN 26*  CREATININE 4.89*  CALCIUM 7.9*    Intake/Output Summary (Last 24 hours) at 11/12/2020 0940 Last data filed at 11/12/2020 0755 Gross per 24 hour  Intake 893 ml  Output --  Net 893 ml        Physical Exam: BP (!) 109/48 (BP Location: Right Arm)   Pulse 65   Temp 98.5 F (36.9 C)   Resp 17   Wt 77.8 kg   LMP 01/19/1992 (Approximate)   SpO2 98%   BMI 30.38 kg/m  Constitutional: No distress . Vital signs reviewed. HENT: Normocephalic.  Atraumatic. Eyes: EOMI. No discharge. Cardiovascular: No JVD.  RRR. Respiratory: Normal effort.  No stridor.  Bilateral clear to auscultation. GI: Non-distended.  BS +. Skin: Warm and dry.   Right chest wall catheter Left upper extremity with dressing CDI Psych: Flat. Normal behavior. Musc: Left upper extremity with edema and tenderness, unchanged Left lower extreme edema, stable Neuro: Alert Motor: RUE: 4-/5 proximal to distal LUE: 3/5 proximal to distal pain inhibition B/l LE: HF, KE, ADF 4/5, stable  Assessment/Plan: 1. Functional deficits which require 3+ hours per day of interdisciplinary therapy in a comprehensive inpatient rehab setting.  Physiatrist is providing close team supervision and 24 hour management of active  medical problems listed below.  Physiatrist and rehab team continue to assess barriers to discharge/monitor patient progress toward functional and medical goals   Care Tool:  Bathing    Body parts bathed by patient: Left arm,Abdomen,Chest,Right upper leg,Left upper leg,Face,Right lower leg   Body parts bathed by helper: Right arm,Left lower leg,Buttocks,Front perineal area     Bathing assist Assist Level: Moderate Assistance - Patient 50 - 74%     Upper Body Dressing/Undressing Upper body dressing   What is the patient wearing?: Pull over shirt    Upper body assist Assist Level: Moderate Assistance - Patient 50 - 74%    Lower Body Dressing/Undressing Lower body dressing      What is the patient wearing?: Pants     Lower body assist Assist for lower body dressing: Total Assistance - Patient < 25%     Toileting Toileting    Toileting assist Assist for toileting: Total Assistance - Patient < 25%     Transfers Chair/bed transfer  Transfers assist  Chair/bed transfer activity did not occur:  (sliding board)  Chair/bed transfer assist level: 2 Helpers Chair/bed transfer assistive device: Sliding board   Locomotion Ambulation   Ambulation assist   Ambulation activity did not occur: Safety/medical concerns  Assist level: 2 helpers Assistive device: Walker-rolling Max distance: 8'   Walk 10 feet activity   Assist  Walk 10 feet activity did not occur: Safety/medical concerns  Walk 50 feet activity   Assist Walk 50 feet with 2 turns activity did not occur: Safety/medical concerns         Walk 150 feet activity   Assist Walk 150 feet activity did not occur: Safety/medical concerns         Walk 10 feet on uneven surface  activity   Assist Walk 10 feet on uneven surfaces activity did not occur: Safety/medical concerns         Wheelchair     Assist Will patient use wheelchair at discharge?: Yes Type of Wheelchair: Manual     Wheelchair assist level: Minimal Assistance - Patient > 75% Max wheelchair distance: 30'    Wheelchair 50 feet with 2 turns activity    Assist        Assist Level: Minimal Assistance - Patient > 75%   Wheelchair 150 feet activity     Assist      Assist Level: Dependent - Patient 0%    Medical Problem List and Plan: 1.  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  Antithrombotics: -DVT/anticoagulation:  Pharmaceutical: Other (comment)--renal dose Eliquis.              -antiplatelet therapy: N/A 3. Pain Management: Oxycodone prn for severe pain. Tramadol added prn for moderate pain.              Added low dose gabapentin for neuropathic pain   Relatively controlled with meds on 5/26  Monitor with increased activity.  4. Mood: LCSW to follow for evaluation and support.              -antipsychotic agents: N/A 5. Neuropsych: This patient is capable of making decisions on his own behalf. 6. Skin/Wound Care:  Routine pressure relief measures.  7. Fluids/Electrolytes/Nutrition: Strict I/Os with daily weights. Renal diet/1200 cc FR.  8. Hematoma LUE s/p evacuation: Continue to monitor pulse and for symptoms. Also elevate LUE 9. Acute on chronic anemia: Improved post transfusion. Continue aransep weekly.   Hb 9.5 on 5/24 10. PAF: Continue amiodarone and Eliquis.   Controlled on 5/26, monitor TID  Monitor with increased exertion 11. ESRD: HD on TTS after therapy to help with tolerance of therapy.  12. Chronic UTI: On low dose Keflex for suppression.  13. B/L foot drop- Prevalon boots for moderate B/L foot drop 14. Leukocytosis  WBCs 12.4 on 5/24, tends to fluctuate  Afebrile  Cont ro monitor 15.  Loose stools  Fiber added on 5/20   Improving  LOS: 9 days A FACE TO FACE EVALUATION WAS PERFORMED  [name] [name] 11/12/2020, 9:40 AM

## 2020-11-13 NOTE — Progress Notes (Signed)
Physical Therapy Session Note  Patient Details  Name: [name] MRN: [id number] Date of Birth: Oct 20, 1945  Today's Date: 11/13/2020 PT Individual Time: 1836-7255 PT Individual Time Calculation (min): 44 min   Short Term Goals: Week 1:  PT Short Term Goal 1 (Week 1): Patient will perform supine to sit with min A and mod cues. PT Short Term Goal 1 - Progress (Week 1): Progressing toward goal PT Short Term Goal 2 (Week 1): Patient will demonstrate sitting balance with S during simple functional task. PT Short Term Goal 2 - Progress (Week 1): Met PT Short Term Goal 3 (Week 1): Patient will perform sit to stand with mod A at least 50% of attempts. PT Short Term Goal 3 - Progress (Week 1): Progressing toward goal PT Short Term Goal 4 (Week 1): Patient will initiate ambulation. PT Short Term Goal 4 - Progress (Week 1): Met Week 2:  PT Short Term Goal 1 (Week 2): Patient to perform sit to stand with mod A from elevated height consistently. PT Short Term Goal 2 (Week 2): Patient will perform w/c mobility with S x 60'. PT Short Term Goal 3 (Week 2): Patient wil ambulated with RW and mod A x 15' (+2 for w/c follow if needed) PT Short Term Goal 4 (Week 2): Patient to perform slide board transfers with mod A at least 50% of the time. Week 3:     Skilled Therapeutic Interventions/Progress Updates:    Pain:  Pt reports L arm pain 6/10 pain.  Treatment to tolerance.  Rest breaks and repositioning as needed.  Pt initially oob in wc receiving meds and agreeable to treatment session w/focus on transfers/standing/functional strengthening. Sliding board transfer wc to mat w/max assist, max cues for sequencing, set up, mechanics.  Worked on Sit to stand from elevated mat to walker w/mod assist of 2, repeated mult times w/cues to increase ant wt shift w/transition.   Able to take single step forward/back x 2, knees buckle on second effort w/max assist to sit safely on mat. Sit to stand in steady x 2   Mat to wc via Steady, stand to sit to wc w/mod assist to control descent. Pt transported back to room at end of session. Pt left oob in wc w/alarm belt set and needs in reach    Therapy Documentation Precautions:  Precautions Precautions: Fall Precaution Comments: L UE fistula with upper arm wound and pain Restrictions Weight Bearing Restrictions: No   Therapy/Group: Individual Therapy  Callie Fielding, [place] 11/13/2020, 12:50 PM

## 2020-11-13 NOTE — Progress Notes (Signed)
Patient ID: [name], female   DOB: February 01, 1946, 75 y.o.   MRN: [id number]  This SW covering for primary SW, Becky Dupree.   SW met with pt and pt dtr in room to provide updates from team conference; goal of [place] A, and d/c date 6/15. SW informed Jacqlyn Larsen will follow-up next week after team conference with updates.   Loralee Pacas, MSW, [place] Office: [phone] Cell: [phone] Fax: [phone]

## 2020-11-13 NOTE — Progress Notes (Signed)
[place] PHYSICAL MEDICINE & REHABILITATION PROGRESS NOTE  Subjective/Complaints: Patient seen sitting up in bed this morning.  She states she slept well overnight.  She denies complaints. She was seen by Nephro yesterday, notes reviewed, no changes.   ROS: Denies CP, SOB, N/V/D  Objective: Vital Signs: Blood pressure (!) 98/47, pulse 70, temperature 97.9 F (36.6 C), resp. rate 17, weight 74.4 kg, last menstrual period 01/19/1992, SpO2 98 %. No results found. Recent Labs    11/10/20 1336 11/12/20 1302  WBC 12.4* 10.2  HGB 9.5* 9.6*  HCT 30.3* 29.9*  PLT 250 213   Recent Labs    11/10/20 1336 11/12/20 1302  NA 133* 133*  K 4.1 4.1  CL 99 98  CO2 25 27  GLUCOSE 116* 89  BUN 26* 24*  CREATININE 4.89* 4.52*  CALCIUM 7.9* 7.8*    Intake/Output Summary (Last 24 hours) at 11/13/2020 0807 Last data filed at 11/13/2020 0156 Gross per 24 hour  Intake 720 ml  Output 3000 ml  Net -2280 ml        Physical Exam: BP (!) 98/47 (BP Location: Right Arm)   Pulse 70   Temp 97.9 F (36.6 C)   Resp 17   Wt 74.4 kg   LMP 01/19/1992 (Approximate)   SpO2 98%   BMI 29.06 kg/m  Constitutional: No distress . Vital signs reviewed. HENT: Normocephalic.  Atraumatic. Eyes: EOMI. No discharge. Cardiovascular: No JVD.  RRR. Respiratory: Normal effort.  No stridor.  Bilateral clear to auscultation. GI: Non-distended.  BS +. Skin: Warm and dry.   Right chest wall catheter Left upper extremity with dressing CDI Psych: Flat. Normal behavior. Musc: Left upper extremity with edema and tenderness, stable Left lower extreme edema, stable Neuro: Alert Motor: RUE: 4-/5 proximal to distal LUE: 3/5 proximal to distal pain inhibition, unchanged B/l LE: HF, KE, ADF 4/5, stable  Assessment/Plan: 1. Functional deficits which require 3+ hours per day of interdisciplinary therapy in a comprehensive inpatient rehab setting.  Physiatrist is providing close team supervision and 24 hour  management of active medical problems listed below.  Physiatrist and rehab team continue to assess barriers to discharge/monitor patient progress toward functional and medical goals   Care Tool:  Bathing    Body parts bathed by patient: Left arm,Abdomen,Chest,Right upper leg,Left upper leg,Face,Right lower leg   Body parts bathed by helper: Right arm,Left lower leg,Buttocks,Front perineal area     Bathing assist Assist Level: Moderate Assistance - Patient 50 - 74%     Upper Body Dressing/Undressing Upper body dressing   What is the patient wearing?: Pull over shirt    Upper body assist Assist Level: Moderate Assistance - Patient 50 - 74%    Lower Body Dressing/Undressing Lower body dressing      What is the patient wearing?: Pants     Lower body assist Assist for lower body dressing: Total Assistance - Patient < 25%     Toileting Toileting    Toileting assist Assist for toileting: Total Assistance - Patient < 25%     Transfers Chair/bed transfer  Transfers assist  Chair/bed transfer activity did not occur:  (sliding board)  Chair/bed transfer assist level: Maximal Assistance - Patient 25 - 49% Chair/bed transfer assistive device: Sliding board   Locomotion Ambulation   Ambulation assist   Ambulation activity did not occur: Safety/medical concerns  Assist level: 2 helpers Assistive device: Walker-rolling Max distance: 8'   Walk 10 feet activity   Assist  Walk 10 feet  activity did not occur: Safety/medical concerns        Walk 50 feet activity   Assist Walk 50 feet with 2 turns activity did not occur: Safety/medical concerns         Walk 150 feet activity   Assist Walk 150 feet activity did not occur: Safety/medical concerns         Walk 10 feet on uneven surface  activity   Assist Walk 10 feet on uneven surfaces activity did not occur: Safety/medical concerns         Wheelchair     Assist Will patient use wheelchair  at discharge?: Yes Type of Wheelchair: Manual    Wheelchair assist level: Minimal Assistance - Patient > 75% Max wheelchair distance: 30'    Wheelchair 50 feet with 2 turns activity    Assist        Assist Level: Minimal Assistance - Patient > 75%   Wheelchair 150 feet activity     Assist      Assist Level: Dependent - Patient 0%    Medical Problem List and Plan: 1.  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  Antithrombotics: -DVT/anticoagulation:  Pharmaceutical: Other (comment)--renal dose Eliquis.              -antiplatelet therapy: N/A 3. Pain Management: Oxycodone prn for severe pain. Tramadol added prn for moderate pain.              Added low dose gabapentin for neuropathic pain   Relatively controlled with meds on 5/27  Monitor with increased activity.  4. Mood: LCSW to follow for evaluation and support.              -antipsychotic agents: N/A 5. Neuropsych: This patient is capable of making decisions on his own behalf. 6. Skin/Wound Care:  Routine pressure relief measures.  7. Fluids/Electrolytes/Nutrition: Strict I/Os with daily weights. Renal diet/1200 cc FR.  8. Hematoma LUE s/p evacuation: Continue to monitor pulse and for symptoms. Also elevate LUE 9. Acute on chronic anemia: Improved post transfusion. Continue aransep weekly.   Hb 9.6 on 5/26 10. PAF: Continue amiodarone and Eliquis.   Controlled on 5/27, monitor TID  Monitor with increased exertion 11. ESRD: HD on TTS after therapy to help with tolerance of therapy.  12. Chronic UTI: On low dose Keflex for suppression.  13. B/L foot drop- Prevalon boots for moderate B/L foot drop 14. Leukocytosis  WBCs 10.2 on 5/26, tends to fluctuate  Afebrile  Cont ro monitor 15.  Loose stools  Fiber added on 5/20   Improving  LOS: 10 days A FACE TO FACE EVALUATION WAS  PERFORMED  Nakhia Levitan [name] 11/13/2020, 8:07 AM

## 2020-11-13 NOTE — Progress Notes (Signed)
Occupational Therapy Session Note  Patient Details  Name: [name] MRN: [id number] Date of Birth: [birth year]  Today's Date: 11/13/2020 OT Individual Time: 4373-5789 OT Individual Time Calculation (min): 60 min    Short Term Goals: Week 1:  OT Short Term Goal 1 (Week 1): Pt will complete UB dressing and bathing with supervision EOB or from wheelchair. OT Short Term Goal 1 - Progress (Week 1): Progressing toward goal OT Short Term Goal 2 (Week 1): Pt will complete LB bathing with max assist sit to stand for two consecutive sessions. OT Short Term Goal 2 - Progress (Week 1): Partly met OT Short Term Goal 3 (Week 1): Pt will donn pants with max assist sit to stand OT Short Term Goal 3 - Progress (Week 1): Partly met OT Short Term Goal 4 (Week 1): Pt will complete stand pivot transfer to the 3:1 with mod assist using the RW for support. OT Short Term Goal 4 - Progress (Week 1): Not met OT Short Term Goal 5 (Week 1): Pt will use the LUE to wash the RUE with supervision and min instructional cueing for initiation. OT Short Term Goal 5 - Progress (Week 1): Progressing toward goal Week 2:  OT Short Term Goal 1 (Week 2): Patient will complete UB bathing/dressing set up A from w/c or EOB. OT Short Term Goal 2 (Week 2): Patient will complete LB bathing mod A using AE PRN. OT Short Term Goal 3 (Week 2): Patient will complete LB dressing max A seated/in stance. OT Short Term Goal 4 (Week 2): Patient will complete toilet transfer using LRAD with mod A. OT Short Term Goal 5 (Week 2): Patient will complete ADL task using LUE to assist with spvsn and min vc's for initiation.  Skilled Therapeutic Interventions/Progress Updates:    Pt received in bed and consented to OT tx. Session focused on functional transfer training and BUE strengthening HEP to increase strength and activity tolerance for ADLs and functional transfers. Pt req max A to transition to sitting and scooting forward to EOB. Pt instructed  in slideboard transfers from EOB to w/c with max A and mod cuing for proper body mechanics and anterior lean during slideboard transfer with fair carryover. Pt wheeled outside for time, instructed in 3# dowel rod exercises including chest press, shoulder flexion, and elbow flexion for 3x10. Pt instructed in shoulder press with 1#db in R hand, no weight in L hand for 3x10. Instructed in anterior weight shift/ sit ups in w/c to increase core strength for functional transfers for 2x10. Pt wheeled back to room, transferred back to bed with slideboard max A and cues for rocking and anterior weight shift. After tx, pt left comfortably in bed with alarm on and all needs met, NT present to take vitals.   Therapy Documentation Precautions:  Precautions Precautions: Fall Precaution Comments: L UE fistula with upper arm wound and pain Restrictions Weight Bearing Restrictions: No Vital Signs: Therapy Vitals Temp: 98.5 F (36.9 C) Pulse Rate: 72 Resp: 18 BP: (!) 144/60 Patient Position (if appropriate): Lying Oxygen Therapy SpO2: 99 % O2 Device: Room Air Pain: Pain Assessment Pain Scale: 0-10 Pain Score: 3  Pain Type: Acute pain Pain Location: Arm Pain Orientation: Left Pain Descriptors / Indicators: Aching Pain Frequency: Constant Pain Onset: With Activity Patients Stated Pain Goal: 0 Pain Intervention(s): Medication (See eMAR)   Therapy/Group: Individual Therapy  [name] 11/13/2020, 3:31 PM
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
## 2020-11-13 NOTE — Progress Notes (Signed)
[facility] Progress Note   Dialysis Orders: [facility] TTS  4h 400/500 71kg 2/2 bathP2 Hep none RIJ TDC (AVF works butinfiltrated and not using) -Mircera 75 q 2 wks (last 5/5) No VDRA  Assessment/ Plan:   1. LUEcompartment syndrome:evacuation of hematoma/ repair of bleeding brachial artery pseudoaneurysm on 5/10 per Dr. Cain/VVS. Pain control per PMD. 2. ESRD - HD TTS. HDon schedule  Tolerated hd on 11/10/20 with net 2L UF, 3L net on 5/26  On  for HD 2nd shift 11/14/20; will try for  UF as tolerated since up signif from EDW ->  Daily AM floor scale weights, will try for another 3-3.5 tomorrow.   3. Hypertension/volume - BPvariable but mostly wellcontrolled. No BP meds on home list.Increased volume on exam, per weights she is over  Dry weight.Continue to titrate down as tolerated.  4. Anemia of CKD/ABLA-HGB 9.2 11/03/2020 Rec'd Aranesp 100 mcg IV 11/03/2020.Hgb 6.5 at lowest here post-op.s/p2 unit prbcs 1/19. 5. Metabolic bone disease -CCa 10, phosimproved.No binders or VDRA.Encourage nutrition. 6. Chronic combined CHF -- volume removal with UF on HD  7. PAFib- On Eliquis/amiodarone 8. Nutrition -Renal diet/fluid restriction.Encourage nutrition.Prot supp for low albumin 9. Debility -now in CIR.   Subjective:   She denies f/c/n/v and states that the  nasal congestion is improved. Tolerating PT and HD yesterday as well.     Objective:   BP (!) 98/47 (BP Location: Right Arm)   Pulse 70   Temp 97.9 F (36.6 C)   Resp 17   Wt 74.4 kg   LMP 01/19/1992 (Approximate)   SpO2 98%   BMI 29.06 kg/m   Intake/Output Summary (Last 24 hours) at 11/13/2020 1478 Last data filed at 11/13/2020 0156 Gross per 24 hour  Intake 720 ml  Output 3000 ml  Net -2280 ml   Weight change:   Physical Exam: General:Pleasant elderly female in NAD GNFAO:Z3,Y8 3/6 systolic M. No R/G. Lungs:CTAB Abdomen:Active BS, NT Extremities:no LE  edema Dialysis Access:RIJ TDC with drsg CDI. Lt AVF +T/B, wrapped in multiple layers of gauze, strong thrill at the inflow  Imaging: No results found.  Labs: BMET Recent Labs  Lab 11/10/20 1336 11/12/20 1302  NA 133* 133*  K 4.1 4.1  CL 99 98  CO2 25 27  GLUCOSE 116* 89  BUN 26* 24*  CREATININE 4.89* 4.52*  CALCIUM 7.9* 7.8*  PHOS 2.4* 2.6   CBC Recent Labs  Lab 11/09/20 0948 11/10/20 1336 11/12/20 1302  WBC 10.5 12.4* 10.2  NEUTROABS 8.5*  --   --   HGB 10.0* 9.5* 9.6*  HCT 32.1* 30.3* 29.9*  MCV 99.1 99.0 98.0  PLT 270 250 213    Medications:    . amiodarone  200 mg Oral Daily  . apixaban  2.5 mg Oral BID  . cephALEXin  250 mg Oral q1800  . Chlorhexidine Gluconate Cloth  6 each Topical Q0600  . darbepoetin (ARANESP) injection - DIALYSIS  100 mcg Intravenous Q Thu-HD  . diclofenac Sodium  2 g Topical QID  . famotidine  20 mg Oral Daily  . feeding supplement (NEPRO CARB STEADY)  237 mL Oral BID BM  . gabapentin  300 mg Oral QHS  . latanoprost  1 drop Both Eyes QHS  . lidocaine-prilocaine  1 application Topical Once per day on Mon Wed Fri  . multivitamin  1 tablet Oral QHS  . polycarbophil  625 mg Oral BID AC  . saccharomyces boulardii  250 mg Oral BID  .  timolol  1 drop Both Eyes Daily      Otelia Santee, MD 11/13/2020, 8:39 AM

## 2020-11-14 LAB — CBC
HCT: 29.7 % — ABNORMAL LOW (ref 36.0–46.0)
Hemoglobin: 9.3 g/dL — ABNORMAL LOW (ref 12.0–15.0)
MCH: 31.4 pg (ref 26.0–34.0)
MCHC: 31.3 g/dL (ref 30.0–36.0)
MCV: 100.3 fL — ABNORMAL HIGH (ref 80.0–100.0)
Platelets: 144 10*3/uL — ABNORMAL LOW (ref 150–400)
RBC: 2.96 MIL/uL — ABNORMAL LOW (ref 3.87–5.11)
RDW: 22.6 % — ABNORMAL HIGH (ref 11.5–15.5)
WBC: 6.2 10*3/uL (ref 4.0–10.5)
nRBC: 0 % (ref 0.0–0.2)

## 2020-11-14 LAB — RENAL FUNCTION PANEL
Albumin: 1.6 g/dL — ABNORMAL LOW (ref 3.5–5.0)
Anion gap: 5 (ref 5–15)
BUN: 15 mg/dL (ref 8–23)
CO2: 28 mmol/L (ref 22–32)
Calcium: 7.2 mg/dL — ABNORMAL LOW (ref 8.9–10.3)
Chloride: 102 mmol/L (ref 98–111)
Creatinine, Ser: 2.89 mg/dL — ABNORMAL HIGH (ref 0.44–1.00)
GFR, Estimated: 16 mL/min — ABNORMAL LOW (ref >60)
Glucose, Bld: 110 mg/dL — ABNORMAL HIGH (ref 70–99)
Phosphorus: 1.7 mg/dL — ABNORMAL LOW (ref 2.5–4.6)
Potassium: 3.4 mmol/L — ABNORMAL LOW (ref 3.5–5.1)
Sodium: 135 mmol/L (ref 135–145)

## 2020-11-14 MED ORDER — ACETAMINOPHEN 325 MG PO TABS
ORAL_TABLET | ORAL | Status: AC
  Filled 2020-11-14: qty 2

## 2020-11-14 MED ORDER — HEPARIN SODIUM (PORCINE) 1000 UNIT/ML IJ SOLN
INTRAMUSCULAR | Status: AC
  Filled 2020-11-14: qty 1

## 2020-11-14 NOTE — Progress Notes (Signed)
[place] PHYSICAL MEDICINE & REHABILITATION PROGRESS NOTE  Subjective/Complaints: Sitting up in bed starting on breakfast. No complaints. Pleased with rehab  ROS: Patient denies fever, rash, sore throat, blurred vision, nausea, vomiting, diarrhea, cough, shortness of breath or chest pain, joint or back pain, headache, or mood change.   Objective: Vital Signs: Blood pressure (!) 120/55, pulse 64, temperature 98.3 F (36.8 C), temperature source Oral, resp. rate 16, weight 77.1 kg, last menstrual period 01/19/1992, SpO2 97 %. No results found. Recent Labs    11/12/20 1302  WBC 10.2  HGB 9.6*  HCT 29.9*  PLT 213   Recent Labs    11/12/20 1302  NA 133*  K 4.1  CL 98  CO2 27  GLUCOSE 89  BUN 24*  CREATININE 4.52*  CALCIUM 7.8*    Intake/Output Summary (Last 24 hours) at 11/14/2020 0924 Last data filed at 11/14/2020 0700 Gross per 24 hour  Intake 1080 ml  Output --  Net 1080 ml        Physical Exam: BP (!) 120/55 (BP Location: Right Arm)   Pulse 64   Temp 98.3 F (36.8 C) (Oral)   Resp 16   Wt 77.1 kg   LMP 01/19/1992 (Approximate)   SpO2 97%   BMI 30.11 kg/m  Constitutional: No distress . Vital signs reviewed. HEENT: EOMI, oral membranes moist Neck: supple Cardiovascular: RRR with murmur. No JVD    Respiratory/Chest: CTA Bilaterally without wheezes or rales. Normal effort    GI/Abdomen: BS +, non-tender, non-distended Ext: no clubbing, cyanosis, or edema Psych: pleasant and cooperative Skin: Wounds LUE. .   Right chest wall catheter clean, intact Left upper extremity with dressing CDI Psych: Flat. Normal behavior. Musc: Left upper extremity with sl tender, edema Left lower extreme edema, stable Neuro: Alert Motor: RUE: 4-/5 proximal to distal LUE: 3/5 proximal to distal pain inhibition, unchanged B/l LE: HF, KE, ADF 4/5, stable  Assessment/Plan: 1. Functional deficits which require 3+ hours per day of interdisciplinary therapy in a comprehensive  inpatient rehab setting.  Physiatrist is providing close team supervision and 24 hour management of active medical problems listed below.  Physiatrist and rehab team continue to assess barriers to discharge/monitor patient progress toward functional and medical goals   Care Tool:  Bathing    Body parts bathed by patient: Left arm,Abdomen,Chest,Right upper leg,Left upper leg,Face,Right lower leg   Body parts bathed by helper: Right arm,Left lower leg,Buttocks,Front perineal area     Bathing assist Assist Level: Moderate Assistance - Patient 50 - 74%     Upper Body Dressing/Undressing Upper body dressing   What is the patient wearing?: Pull over shirt    Upper body assist Assist Level: Moderate Assistance - Patient 50 - 74%    Lower Body Dressing/Undressing Lower body dressing      What is the patient wearing?: Pants     Lower body assist Assist for lower body dressing: Total Assistance - Patient < 25%     Toileting Toileting    Toileting assist Assist for toileting: Total Assistance - Patient < 25%     Transfers Chair/bed transfer  Transfers assist  Chair/bed transfer activity did not occur:  (sliding board)  Chair/bed transfer assist level: Maximal Assistance - Patient 25 - 49% Chair/bed transfer assistive device: Sliding board   Locomotion Ambulation   Ambulation assist   Ambulation activity did not occur: Safety/medical concerns  Assist level: 2 helpers Assistive device: Walker-rolling Max distance: 8'   Walk 10 feet activity  Assist  Walk 10 feet activity did not occur: Safety/medical concerns        Walk 50 feet activity   Assist Walk 50 feet with 2 turns activity did not occur: Safety/medical concerns         Walk 150 feet activity   Assist Walk 150 feet activity did not occur: Safety/medical concerns         Walk 10 feet on uneven surface  activity   Assist Walk 10 feet on uneven surfaces activity did not occur:  Safety/medical concerns         Wheelchair     Assist Will patient use wheelchair at discharge?: Yes Type of Wheelchair: Manual    Wheelchair assist level: Minimal Assistance - Patient > 75% Max wheelchair distance: 30'    Wheelchair 50 feet with 2 turns activity    Assist        Assist Level: Minimal Assistance - Patient > 75%   Wheelchair 150 feet activity     Assist      Assist Level: Dependent - Patient 0%    Medical Problem List and Plan: 1.  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  Antithrombotics: -DVT/anticoagulation:  Pharmaceutical: Other (comment)--renal dose Eliquis.              -antiplatelet therapy: N/A 3. Pain Management: Oxycodone prn for severe pain. Tramadol added prn for moderate pain.              Added low dose gabapentin for neuropathic pain   Relatively controlled with meds on 5/28  Monitor with increased activity.  4. Mood: LCSW to follow for evaluation and support.              -antipsychotic agents: N/A 5. Neuropsych: This patient is capable of making decisions on his own behalf. 6. Skin/Wound Care:  Routine pressure relief measures.  7. Fluids/Electrolytes/Nutrition: Strict I/Os with daily weights. Renal diet/1200 cc FR.  8. Hematoma LUE s/p evacuation: Continue to monitor pulse and for symptoms. Also elevate LUE 9. Acute on chronic anemia: Improved post transfusion. Continue aransep weekly.   Hb 9.6 on 5/26 10. PAF: Continue amiodarone and Eliquis.   Controlled on 5/28, monitor TID  Monitor with increased exertion 11. ESRD: HD on TTS after therapy to help with tolerance of therapy.  12. Chronic UTI: On low dose Keflex for suppression.  13. B/L foot drop- Prevalon boots for moderate B/L foot drop 14. Leukocytosis  WBCs 10.2 on 5/26, tends to fluctuate  Afebrile  Cont ro  monitor 15.  Loose stools  Fiber added on 5/20   Improving  LOS: 11 days A FACE TO FACE EVALUATION WAS PERFORMED  Meredith Staggers 11/14/2020, 9:24 AM
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
## 2020-11-16 NOTE — Progress Notes (Signed)
[place] PHYSICAL MEDICINE & REHABILITATION PROGRESS NOTE  Subjective/Complaints:  Very sleepy this AM- didn't sleep well last night- wants to nap before therapy.  No other complaints  ROS:  Pt denies SOB, abd pain, CP, N/V/C/D, and vision changes  Objective: Vital Signs: Blood pressure (!) 141/60, pulse 70, temperature 98.1 F (36.7 C), temperature source Oral, resp. rate 18, weight 73.6 kg, last menstrual period 01/19/1992, SpO2 99 %. No results found. Recent Labs    11/14/20 1440  WBC 6.2  HGB 9.3*  HCT 29.7*  PLT 144*   Recent Labs    11/14/20 1440  NA 135  K 3.4*  CL 102  CO2 28  GLUCOSE 110*  BUN 15  CREATININE 2.89*  CALCIUM 7.2*    Intake/Output Summary (Last 24 hours) at 11/16/2020 1023 Last data filed at 11/15/2020 1758 Gross per 24 hour  Intake 360 ml  Output --  Net 360 ml        Physical Exam: BP (!) 141/60 (BP Location: Right Arm)   Pulse 70   Temp 98.1 F (36.7 C) (Oral)   Resp 18   Wt 73.6 kg   LMP 01/19/1992 (Approximate)   SpO2 99%   BMI 28.74 kg/m     General: awake, alert, but very sleepy- spoke appropriately;  NAD HENT: conjugate gaze; oropharynx moist CV: regular rate; no JVD Pulmonary: CTA B/L; no W/R/R- good air movement GI: soft, NT, ND, (+)BS Psychiatric: sleepy Neurological: overall alert Ext: no clubbing, cyanosis, or edema Skin: Wounds LUE. .   Right chest wall catheter clean, intact Left upper extremity with dressing CDI Musc: Left upper extremity with sl tender, edema Left lower extreme edema, stable Motor: RUE: 4-/5 proximal to distal LUE: 3/5 proximal to distal pain inhibition, unchanged B/l LE: HF, KE, ADF 4/5, stable  Assessment/Plan: 1. Functional deficits which require 3+ hours per day of interdisciplinary therapy in a comprehensive inpatient rehab setting.  Physiatrist is providing close team supervision and 24 hour management of active medical problems listed below.  Physiatrist and rehab team  continue to assess barriers to discharge/monitor patient progress toward functional and medical goals   Care Tool:  Bathing    Body parts bathed by patient: Left arm,Abdomen,Chest,Right upper leg,Left upper leg,Face,Right lower leg   Body parts bathed by helper: Right arm,Left lower leg,Buttocks,Front perineal area     Bathing assist Assist Level: Moderate Assistance - Patient 50 - 74%     Upper Body Dressing/Undressing Upper body dressing   What is the patient wearing?: Pull over shirt    Upper body assist Assist Level: Moderate Assistance - Patient 50 - 74%    Lower Body Dressing/Undressing Lower body dressing      What is the patient wearing?: Pants     Lower body assist Assist for lower body dressing: Total Assistance - Patient < 25%     Toileting Toileting    Toileting assist Assist for toileting: Total Assistance - Patient < 25%     Transfers Chair/bed transfer  Transfers assist  Chair/bed transfer activity did not occur:  (sliding board)  Chair/bed transfer assist level: Maximal Assistance - Patient 25 - 49% Chair/bed transfer assistive device: Sliding board   Locomotion Ambulation   Ambulation assist   Ambulation activity did not occur: Safety/medical concerns  Assist level: 2 helpers Assistive device: Walker-rolling Max distance: 8'   Walk 10 feet activity   Assist  Walk 10 feet activity did not occur: Safety/medical concerns  Walk 50 feet activity   Assist Walk 50 feet with 2 turns activity did not occur: Safety/medical concerns         Walk 150 feet activity   Assist Walk 150 feet activity did not occur: Safety/medical concerns         Walk 10 feet on uneven surface  activity   Assist Walk 10 feet on uneven surfaces activity did not occur: Safety/medical concerns         Wheelchair     Assist Will patient use wheelchair at discharge?: Yes Type of Wheelchair: Manual    Wheelchair assist level:  Minimal Assistance - Patient > 75% Max wheelchair distance: 30'    Wheelchair 50 feet with 2 turns activity    Assist        Assist Level: Minimal Assistance - Patient > 75%   Wheelchair 150 feet activity     Assist      Assist Level: Dependent - Patient 0%    Medical Problem List and Plan: 1.  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  Antithrombotics: -DVT/anticoagulation:  Pharmaceutical: Other (comment)--renal dose Eliquis.              -antiplatelet therapy: N/A 3. Pain Management: Oxycodone prn for severe pain. Tramadol added prn for moderate pain.              Added low dose gabapentin for neuropathic pain   5/30- pain overall controlled, but notes she's sleepy- con't regimen for now- monitor sedation  Monitor with increased activity.  4. Mood: LCSW to follow for evaluation and support.              -antipsychotic agents: N/A 5. Neuropsych: This patient is capable of making decisions on his own behalf. 6. Skin/Wound Care:  Routine pressure relief measures.  7. Fluids/Electrolytes/Nutrition: Strict I/Os with daily weights. Renal diet/1200 cc FR.  8. Hematoma LUE s/p evacuation: Continue to monitor pulse and for symptoms. Also elevate LUE 9. Acute on chronic anemia: Improved post transfusion. Continue aransep weekly.   Hb 9.6 on 5/26 10. PAF: Continue amiodarone and Eliquis.   Controlled on 5/28, monitor TID  Monitor with increased exertion 11. ESRD: HD on TTS after therapy to help with tolerance of therapy.   5/30- Cr down to 2.89 from 4.5-  12. Chronic UTI: On low dose Keflex for suppression.  13. B/L foot drop- Prevalon boots for moderate B/L foot drop 14. Leukocytosis  WBCs 10.2 on 5/26, tends to fluctuate  Afebrile  Cont ro monitor 15.  Loose stools  Fiber added on 5/20   Improving 16. Hypokalemia  5/30- last K+ 3.4- will  recheck in AM  LOS: 13 days A FACE TO [place] 11/16/2020, 10:23 AM

## 2020-11-16 NOTE — Progress Notes (Signed)
Occupational Therapy Session Note  Patient Details  Name: [name] MRN: [id number] Date of Birth: February 18, 1946  Today's Date: 11/16/2020 OT Individual Time: 0930-1003 OT Individual Time Calculation (min): 33 min    Short Term Goals: Week 2:  OT Short Term Goal 1 (Week 2): Patient will complete UB bathing/dressing set up A from w/c or EOB. OT Short Term Goal 2 (Week 2): Patient will complete LB bathing mod A using AE PRN. OT Short Term Goal 3 (Week 2): Patient will complete LB dressing max A seated/in stance. OT Short Term Goal 4 (Week 2): Patient will complete toilet transfer using LRAD with mod A. OT Short Term Goal 5 (Week 2): Patient will complete ADL task using LUE to assist with spvsn and min vc's for initiation.  Skilled Therapeutic Interventions/Progress Updates:    Pt received supine in bed, reporting not feeling well (nausea and upset stomach), but agreeable to OT. Sup>sit mod A for BLE management. Scooting EOB with improved anterior weight shift min A. Sit>stand mod A using Stedy; stand>sit min A. Pt stood in [place] for 2.5 minutes with BUEs supported and weight shifting with fair posture but slight L lean. Bed>w/c transfer using Stedy. Pt taken to day room to complete UE conditioning tasks in preparation for improved functional mobility and independence in self-care. Pt completed 2 min on UE ergometer x2 before requiring rest breaks. Pt engaged in LUE [facility] task involving finger<>palm translation, pincer grasp/accurate release with mod vc's for technique; continues to demo difficulty in translation and pincer grasp without compensatory movements, but some improvement in [facility] this session. Pt remained seated in w/c, alarm set, daughter present, and all needs met following session.  Therapy Documentation Precautions:  Precautions Precautions: Fall Precaution Comments: L UE fistula with upper arm wound and pain Restrictions Weight Bearing Restrictions: No Pain: Pain  Assessment Pain Scale: 0-10 Pain Score: 3  Faces Pain Scale: Hurts a little bit Pain Type: Acute pain Pain Location: Arm Pain Orientation: Left Pain Descriptors / Indicators: Aching Pain Frequency: Intermittent Pain Onset: With Activity Patients Stated Pain Goal: 0 Pain Intervention(s): Repositioned   Therapy/Group: Individual Therapy  [name] 11/16/2020, 12:48 PM

## 2020-11-16 NOTE — Progress Notes (Signed)
Occupational Therapy Session Note  Patient Details  Name: [name] MRN: [id number] Date of Birth: Dec 20, 1945  Today's Date: 11/16/2020 OT Individual Time: 0930-1003 OT Individual Time Calculation (min): 33 min    Short Term Goals: Week 1:  OT Short Term Goal 1 (Week 1): Pt will complete UB dressing and bathing with supervision EOB or from wheelchair. OT Short Term Goal 1 - Progress (Week 1): Progressing toward goal OT Short Term Goal 2 (Week 1): Pt will complete LB bathing with max assist sit to stand for two consecutive sessions. OT Short Term Goal 2 - Progress (Week 1): Partly met OT Short Term Goal 3 (Week 1): Pt will donn pants with max assist sit to stand OT Short Term Goal 3 - Progress (Week 1): Partly met OT Short Term Goal 4 (Week 1): Pt will complete stand pivot transfer to the 3:1 with mod assist using the RW for support. OT Short Term Goal 4 - Progress (Week 1): Not met OT Short Term Goal 5 (Week 1): Pt will use the LUE to wash the RUE with supervision and min instructional cueing for initiation. OT Short Term Goal 5 - Progress (Week 1): Progressing toward goal  Skilled Therapeutic Interventions/Progress Updates:    Treatment session with focus on LUE AAROM and strengthening.  Pt received upright in w/c reporting difficulty with opening juice and cream cheese containers on breakfast tray.  Engaged in [place] and strengthening with resistive peg board, incorporating stacking pegs together and then removing.  Pt reports difficulty with sustained grasp strength to pull pegs apart and when opening containers.  Incorporated reaching overhead and forward to increase ROM while incorporating strengthening.  Engaged in simulated opening containers with pt able to open theraputty top and scoop putty with utensil to simulate opening food items.  Therapist repositioned hand with pt able to maintain grasp, however reports discomfort.  Educated on visual attention to task to compensate for  decreased sensation.  Pt remained upright in w/c with chair alarm on and all needs in reach.  Therapy Documentation Precautions:  Precautions Precautions: Fall Precaution Comments: L UE fistula with upper arm wound and pain Restrictions Weight Bearing Restrictions: No Pain: Pain Assessment Pain Scale: 0-10 Pain Score: 3  Pain Type: Acute pain Pain Location: Arm Pain Orientation: Left Pain Descriptors / Indicators: Aching Pain Frequency: Intermittent Pain Onset: On-going Patients Stated Pain Goal: 0 Pain Intervention(s): Medication (See eMAR)   Therapy/Group: Individual Therapy  Simonne Come 11/16/2020, 12:05 PM

## 2020-11-16 NOTE — Progress Notes (Signed)
Physical Therapy Session Note  Patient Details  Name: [name] MRN: [id number] Date of Birth: April 21, 1946  Today's Date: 11/16/2020 PT Individual Time:Session1: 2536-6440; Arthor Captain: [id number]  PT Individual Time Calculation (min): 30 min & 70 min  Short Term Goals: Week 2:  PT Short Term Goal 1 (Week 2): Patient to perform sit to stand with mod A from elevated height consistently. PT Short Term Goal 2 (Week 2): Patient will perform w/c mobility with S x 60'. PT Short Term Goal 3 (Week 2): Patient wil ambulated with RW and mod A x 15' (+2 for w/c follow if needed) PT Short Term Goal 4 (Week 2): Patient to perform slide board transfers with mod A at least 50% of the time.  Skilled Therapeutic Interventions/Progress Updates:    Session1:  Patient in supine and reports had arm pain yesterday, but okay today.  Requesting to put on pants.  Donned TED hose in supine with total A.  Pants on with mod to max A.  Patient rolling for donning pants with CGA.  Patient supine to sit with mod A for LE's and light lifting for trunk.  Patient sit to stand to RW from elevated surface with mod A to finish pulling up pants.  Standing about 40 seconds for getting balance, feet back and COG over BOS.  Patient attempted sit to stand again with up to max A but unable to get legs to help lifting.  Slide board transfer to w/c with mod A.  Patient attempted 2 more times to stand, but unable.  Left seated in w/c with call bell and needs in reach with next session in 30 minutes.   Session2:  Patient initially in pain and nauseated and had already requested medication from RN.  Asked PT to return in few minutes.  Patient agreed to participate as much as able upon PT return.  Patient supine to sit with mod A (+2 for safety).  Slide board transfer to w/c mod A (+2 for safety).  Assisted in w/c to therapy gym.  Performed sit to stand x 3.  First attempt able to get straight, but unable to weight shift for attempts at stepping  with RW.  Other trials unable to get legs fully extended due to weakness.  Patient performed slide board transfer to mat.  Seated for trunk extensions w/ 10 sec holds x 5 reps.  Patient performed R lateral upward reaching to retrieve horseshoes off rim of basketball goal to toss forward to staub.  Then performed anterior forward reach with R arm then back to Rt ward reaching.  Patient sit to supine mod A for LE's and performed exercises as noted below. Performed supine to sit with mod A.  Transfer to w/c mod A using slide board.  Patient assisted to room and transferred to bed via slide board.  Sit to supine with mod A.  Patient rolled to doff pants and for performing perineal hygiene.  Replaced sacral pad as was soiled and placed clean brief.  Patient left in supine with call bell and needs in reach, L arm on pillow.  Therapy Documentation Precautions:  Precautions Precautions: Fall Precaution Comments: L UE fistula with upper arm wound and pain Restrictions Weight Bearing Restrictions: No Pain: Pain Assessment Pain Scale: 0-10 Pain Score: 3  Faces Pain Scale: Hurts a little bit Pain Type: Acute pain Pain Location: Arm Pain Orientation: Left Pain Descriptors / Indicators: Aching Pain Frequency: Intermittent Pain Onset: With Activity Patients Stated Pain Goal: 0 Pain  Intervention(s): Repositioned Exercises: General Exercises - Lower Extremity Hip ABduction/ADduction: Strengthening;Both;10 reps;Supine (hooklying with yellow t-band 3 x 10 reps) Hip Flexion/Marching: Strengthening;10 reps;Supine;Both (hooklying) Total Joint Exercises Short Arc Quad: Strengthening;Both;10 reps;Supine (5 sec hold w/ 1.5#) Bridges: Strengthening;Both;10 reps;Supine (ball between knees 5 sec hold)   Therapy/Group: Individual Therapy  Reginia Naas  [place], PT 11/16/2020, 12:28 PM

## 2020-11-16 NOTE — Progress Notes (Signed)
  [facility] Progress Note   Dialysis Orders: [facility] TTS  4h 400/500 71kg 2/2 bathP2 Hep none RIJ TDC (AVF works butinfiltrated and not using) -Mircera 75 q 2 wks (last 5/5) No VDRA  Assessment/ Plan:   1. LUEcompartment syndrome:evacuation of hematoma/ repair of bleeding brachial artery pseudoaneurysm on 5/10 per Dr. Cain/VVS. Pain control per PMD. 2. ESRD - HD TTS. HDtomorrow.  3. Hypertension/volume - BPvariable but mostly wellcontrolled. No BP meds on home list.Increased volume on exam, per weights she is over dry wt.Continue to titrate down as tolerated.  4. Anemia of CKD/ABLA-HGB 9.2 11/03/2020 Rec'd Aranesp 100 mcg IV 11/03/2020.Hgb 6.5 at lowest here post-op.s/p2 unit prbcs 9/37. 5. Metabolic bone disease -CCa 10, phosimproved.No binders or VDRA.Encourage nutrition. 6. Chronic combined CHF -- volume removal with UF on HD  7. PAFib- On Eliquis/amiodarone 8. Nutrition -Renal diet/fluid restriction.Encourage nutrition.Prot supp for low albumin 9. Debility -now in CIR.  Kelly Splinter, MD 11/16/2020, 4:06 PM    Subjective:   No c/o today   Objective:   BP (!) 141/60 (BP Location: Right Arm)   Pulse 70   Temp 98.1 F (36.7 C) (Oral)   Resp 18   Wt 73.6 kg   LMP 01/19/1992 (Approximate)   SpO2 99%   BMI 28.74 kg/m   Intake/Output Summary (Last 24 hours) at 11/16/2020 1606 Last data filed at 11/16/2020 1407 Gross per 24 hour  Intake 480 ml  Output --  Net 480 ml   Weight change: -3.5 kg  Physical Exam: General:Pleasant elderly female in NAD DSKAJ:G8,T1 3/6 systolic M. No R/G. Lungs:CTAB Abdomen:Active BS, NT Extremities:no LE edema Dialysis Access:RIJ TDC with drsg CDI. Lt AVF +T/B, wrapped in multiple layers of gauze, strong thrill at the inflow  Imaging: No results found.  Labs: BMET Recent Labs  Lab 11/10/20 1336 11/12/20 1302 11/14/20 1440  NA 133* 133* 135  K 4.1 4.1 3.4*  CL 99 98 102   CO2 25 27 28   GLUCOSE 116* 89 110*  BUN 26* 24* 15  CREATININE 4.89* 4.52* 2.89*  CALCIUM 7.9* 7.8* 7.2*  PHOS 2.4* 2.6 1.7*   CBC Recent Labs  Lab 11/10/20 1336 11/12/20 1302 11/14/20 1440  WBC 12.4* 10.2 6.2  HGB 9.5* 9.6* 9.3*  HCT 30.3* 29.9* 29.7*  MCV 99.0 98.0 100.3*  PLT 250 213 144*    Medications:    . amiodarone  200 mg Oral Daily  . apixaban  2.5 mg Oral BID  . cephALEXin  250 mg Oral q1800  . Chlorhexidine Gluconate Cloth  6 each Topical Q0600  . darbepoetin (ARANESP) injection - DIALYSIS  100 mcg Intravenous Q Thu-HD  . diclofenac Sodium  2 g Topical QID  . famotidine  20 mg Oral Daily  . feeding supplement (NEPRO CARB STEADY)  237 mL Oral BID BM  . gabapentin  300 mg Oral QHS  . latanoprost  1 drop Both Eyes QHS  . lidocaine-prilocaine  1 application Topical Once per day on Mon Wed Fri  . multivitamin  1 tablet Oral QHS  . polycarbophil  625 mg Oral BID AC  . saccharomyces boulardii  250 mg Oral BID  . timolol  1 drop Both Eyes Daily

## 2020-11-17 LAB — RENAL FUNCTION PANEL
Albumin: 1.6 g/dL — ABNORMAL LOW (ref 3.5–5.0)
Anion gap: 10 (ref 5–15)
BUN: 28 mg/dL — ABNORMAL HIGH (ref 8–23)
CO2: 28 mmol/L (ref 22–32)
Calcium: 8 mg/dL — ABNORMAL LOW (ref 8.9–10.3)
Chloride: 97 mmol/L — ABNORMAL LOW (ref 98–111)
Creatinine, Ser: 4.68 mg/dL — ABNORMAL HIGH (ref 0.44–1.00)
GFR, Estimated: 9 mL/min — ABNORMAL LOW (ref >60)
Glucose, Bld: 82 mg/dL (ref 70–99)
Phosphorus: 3.1 mg/dL (ref 2.5–4.6)
Potassium: 4.1 mmol/L (ref 3.5–5.1)
Sodium: 135 mmol/L (ref 135–145)

## 2020-11-17 LAB — CBC WITH DIFFERENTIAL/PLATELET
Abs Immature Granulocytes: 0 10*3/uL (ref 0.00–0.07)
Basophils Absolute: 0.1 10*3/uL (ref 0.0–0.1)
Basophils Relative: 1 %
Eosinophils Absolute: 0.4 10*3/uL (ref 0.0–0.5)
Eosinophils Relative: 5 %
HCT: 33.1 % — ABNORMAL LOW (ref 36.0–46.0)
Hemoglobin: 10.2 g/dL — ABNORMAL LOW (ref 12.0–15.0)
Lymphocytes Relative: 5 %
Lymphs Abs: 0.4 10*3/uL — ABNORMAL LOW (ref 0.7–4.0)
MCH: 31.3 pg (ref 26.0–34.0)
MCHC: 30.8 g/dL (ref 30.0–36.0)
MCV: 101.5 fL — ABNORMAL HIGH (ref 80.0–100.0)
Monocytes Absolute: 0.2 10*3/uL (ref 0.1–1.0)
Monocytes Relative: 3 %
Neutro Abs: 6.2 10*3/uL (ref 1.7–7.7)
Neutrophils Relative %: 86 %
Platelets: 209 10*3/uL (ref 150–400)
RBC: 3.26 MIL/uL — ABNORMAL LOW (ref 3.87–5.11)
RDW: 22.1 % — ABNORMAL HIGH (ref 11.5–15.5)
WBC: 7.2 10*3/uL (ref 4.0–10.5)
nRBC: 0 % (ref 0.0–0.2)
nRBC: 0 /100 WBC

## 2020-11-17 MED ORDER — HEPARIN SODIUM (PORCINE) 1000 UNIT/ML IJ SOLN
INTRAMUSCULAR | Status: AC
  Filled 2020-11-17: qty 4

## 2020-11-17 NOTE — Progress Notes (Signed)
Physical Therapy Session Note  Patient Details  Name: [name] MRN: [id number] Date of Birth: 1946-01-15  Today's Date: 11/17/2020 PT Individual Time: 0812-0907 PT Individual Time Calculation (min): 55 min   Short Term Goals: Week 2:  PT Short Term Goal 1 (Week 2): Patient to perform sit to stand with mod A from elevated height consistently. PT Short Term Goal 2 (Week 2): Patient will perform w/c mobility with S x 60'. PT Short Term Goal 3 (Week 2): Patient wil ambulated with RW and mod A x 15' (+2 for w/c follow if needed) PT Short Term Goal 4 (Week 2): Patient to perform slide board transfers with mod A at least 50% of the time.  Skilled Therapeutic Interventions/Progress Updates:    Patient in supine and states nursing held her up this morning.  Initially felt she did not need change for her brief, then felt she did.  Rolled with min A for cleaning total A for hygiene and brief change.  Donned TEDs and threaded pants with max A.  Patient rolling to complete pulling up pants in supine.  Supine to sit with mod A.  Seated with posterior bias min A initially for sitting balance to doff and don shirt.  Attempted to button shirt, but unable so total A to complete buttoning.  Patient performed slide board transfer to w/c with mod A.  In w/c assisted to therapy gym.  Patient sit to stand to EVA walker with +2 mod A.  Patient ambulated 10' with mod A and w/c follow with EVA walker noted buckling in knees and needing assist for hip extension.  Assisted in w/c to dayroom and performed 3 x 1 minutes bouts on [place] at 55 cm/sec with cues for increased depth of steps.  Assisted in w/c to room.  Patient requesting back to bed so performed slide board transfer to bed with mod A.  Sit to supine mod A for both LE's and repositioning for hips and shoulders.  Left in supine with call bell in reach and bed alarm active.    Therapy Documentation Precautions:  Precautions Precautions: Fall Precaution  Comments: L UE fistula with upper arm wound and pain Restrictions Weight Bearing Restrictions: No Pain: Pain Assessment Pain Scale: 0-10 Pain Score: 0-No pain   Therapy/Group: Individual Therapy  Reginia Naas  Magda Kiel, PT 11/17/2020, 8:51 AM

## 2020-11-17 NOTE — Progress Notes (Signed)
[facility] PHYSICAL MEDICINE & REHABILITATION PROGRESS NOTE  Subjective/Complaints: Patient seen sitting up in bed this morning.  She states she slept well overnight.  She states she had good weekend.  She was seen by nephrology yesterday, notes reviewed-no changes.  ROS: Denies CP, SOB, N/V/D  Objective: Vital Signs: Blood pressure (!) 104/46, pulse 60, temperature 98 F (36.7 C), resp. rate 14, weight 78.5 kg, last menstrual period 01/19/1992, SpO2 99 %. No results found. Recent Labs    11/14/20 1440 11/17/20 0445  WBC 6.2 7.2  HGB 9.3* 10.2*  HCT 29.7* 33.1*  PLT 144* 209   Recent Labs    11/14/20 1440 11/17/20 0445  NA 135 135  K 3.4* 4.1  CL 102 97*  CO2 28 28  GLUCOSE 110* 82  BUN 15 28*  CREATININE 2.89* 4.68*  CALCIUM 7.2* 8.0*    Intake/Output Summary (Last 24 hours) at 11/17/2020 0859 Last data filed at 11/17/2020 0816 Gross per 24 hour  Intake 360 ml  Output --  Net 360 ml        Physical Exam: BP (!) 104/46 (BP Location: Right Arm)   Pulse 60   Temp 98 F (36.7 C)   Resp 14   Wt 78.5 kg   LMP 01/19/1992 (Approximate)   SpO2 99%   BMI 30.66 kg/m  Constitutional: No distress . Vital signs reviewed. HENT: Normocephalic.  Atraumatic. Eyes: EOMI. No discharge. Cardiovascular: No JVD.  RRR.  + Murmur. Respiratory: Normal effort.  No stridor.  Bilateral clear to auscultation. GI: Non-distended.  BS +. Skin: Warm and dry.   Right chest wall catheter clean, intact Left upper extremity with dressing CDI Psych: Normal mood.  Normal behavior. Musc: Left upper extremity with edema and tenderness Left lower extreme edema, stable Neuro: Alert Motor: RUE: 4-/5 proximal to distal LUE: 3/5 proximal to distal pain inhibition, stable B/l LE: HF, KE, ADF 4/5, stable  Assessment/Plan: 1. Functional deficits which require 3+ hours per day of interdisciplinary therapy in a comprehensive inpatient rehab setting.  Physiatrist is providing close team  supervision and 24 hour management of active medical problems listed below.  Physiatrist and rehab team continue to assess barriers to discharge/monitor patient progress toward functional and medical goals   Care Tool:  Bathing    Body parts bathed by patient: Left arm,Abdomen,Chest,Right upper leg,Left upper leg,Face,Right lower leg   Body parts bathed by helper: Right arm,Left lower leg,Buttocks,Front perineal area     Bathing assist Assist Level: Moderate Assistance - Patient 50 - 74%     Upper Body Dressing/Undressing Upper body dressing   What is the patient wearing?: Pull over shirt    Upper body assist Assist Level: Moderate Assistance - Patient 50 - 74%    Lower Body Dressing/Undressing Lower body dressing      What is the patient wearing?: Pants     Lower body assist Assist for lower body dressing: Total Assistance - Patient < 25%     Toileting Toileting    Toileting assist Assist for toileting: Total Assistance - Patient < 25%     Transfers Chair/bed transfer  Transfers assist  Chair/bed transfer activity did not occur:  (sliding board)  Chair/bed transfer assist level: Moderate Assistance - Patient 50 - 74% Chair/bed transfer assistive device: Sliding board   Locomotion Ambulation   Ambulation assist   Ambulation activity did not occur: Safety/medical concerns  Assist level: 2 helpers Assistive device: Walker-rolling Max distance: 8'   Walk 10 feet activity  Assist  Walk 10 feet activity did not occur: Safety/medical concerns        Walk 50 feet activity   Assist Walk 50 feet with 2 turns activity did not occur: Safety/medical concerns         Walk 150 feet activity   Assist Walk 150 feet activity did not occur: Safety/medical concerns         Walk 10 feet on uneven surface  activity   Assist Walk 10 feet on uneven surfaces activity did not occur: Safety/medical concerns         Wheelchair     Assist  Will patient use wheelchair at discharge?: Yes Type of Wheelchair: Manual    Wheelchair assist level: Minimal Assistance - Patient > 75% Max wheelchair distance: 30'    Wheelchair 50 feet with 2 turns activity    Assist        Assist Level: Minimal Assistance - Patient > 75%   Wheelchair 150 feet activity     Assist      Assist Level: Dependent - Patient 0%    Medical Problem List and Plan: 1.  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  Antithrombotics: -DVT/anticoagulation:  Pharmaceutical: Other (comment)--renal dose Eliquis.              -antiplatelet therapy: N/A 3. Pain Management: Oxycodone prn for severe pain. Tramadol added prn for moderate pain.              Added low dose gabapentin for neuropathic pain   Controlled with meds on 5/31  Monitor with increased activity.  4. Mood: LCSW to follow for evaluation and support.              -antipsychotic agents: N/A 5. Neuropsych: This patient is capable of making decisions on his own behalf. 6. Skin/Wound Care:  Routine pressure relief measures.  7. Fluids/Electrolytes/Nutrition: Strict I/Os with daily weights. Renal diet/1200 cc FR.  8. Hematoma LUE s/p evacuation: Continue to monitor pulse and for symptoms. Also elevate LUE 9. Acute on chronic anemia: Improved post transfusion. Continue aransep weekly.   Hb 10.2 on 5/31 10. PAF: Continue amiodarone and Eliquis.   Controlled on 5/31, monitor TID  Monitor with increased exertion 11.  ESRD: HD on TTS after therapy to help with tolerance of therapy.  12. Chronic UTI: On low dose Keflex for suppression.  13. B/L foot drop- Prevalon boots for moderate B/L foot drop 14. Leukocytosis  WBCs 7.2 on 5/31, tends to fluctuate  Afebrile  Cont ro monitor 15.  Loose stools  Fiber added on 5/20   Improved  LOS: 14 days A FACE TO FACE EVALUATION WAS  PERFORMED  Bion Todorov [name] 11/17/2020, 8:59 AM

## 2020-11-17 NOTE — Procedures (Signed)
   I was present at this dialysis session, have reviewed the session itself and made  appropriate changes Kelly Splinter MD [place] pager [phone]   11/17/2020, 4:39 PM

## 2020-11-17 NOTE — Progress Notes (Signed)
Occupational Therapy Session Note  Patient Details  Name: [name] MRN: [id number] Date of Birth: 08-30-1945  Today's Date: 11/17/2020 OT Individual Time: 1000-1100 OT Individual Time Calculation (min): 60 min    Short Term Goals: Week 2:  OT Short Term Goal 1 (Week 2): Patient will complete UB bathing/dressing set up A from w/c or EOB. OT Short Term Goal 2 (Week 2): Patient will complete LB bathing mod A using AE PRN. OT Short Term Goal 3 (Week 2): Patient will complete LB dressing max A seated/in stance. OT Short Term Goal 4 (Week 2): Patient will complete toilet transfer using LRAD with mod A. OT Short Term Goal 5 (Week 2): Patient will complete ADL task using LUE to assist with spvsn and min vc's for initiation.  Skilled Therapeutic Interventions/Progress Updates:    Pt received supine in bed, reporting slight nausea, but agreeable to OT session. Supine to sitting edge of bed with mod A.  Sit to stand to stedy with min A of 2.  Utilized stedy to w/c.  Stand to sit on w/c CGA/min A.  Session focused on [facility] of L hand and BUE conditioning with postural control. Pt completed multiple FM activities sitting in w/c alternating with back supported/unsupported focusing on palm<>finger translation, pincer grasp, and rotating items with digits 1-3. Pt demo'd difficulties with FM tasks, but less compensatory movements when picking up items off flat surface. Pt provided education on adaptive materials to make FM tasks more stable including adding dycem/nonslick surface under task to stabilize. During FM tasks, pt became emotional about CLOF; therapeutic listening and discussion of progress and POC provided with slight improved affect. Pt engaged in BUE conditioning on UE ergometer for ~2 min before needing rest break due to fatigue. Education provided on mindfulness activities to perform between sessions and during dialysis. Pt remained in w/c, ready for following OT session.  Pt may benefit from  neuropsych consult to discuss slow progress with mobility and functional independence.   Therapy Documentation Precautions:  Precautions Precautions: Fall Precaution Comments: L UE fistula with upper arm wound and pain Restrictions Weight Bearing Restrictions: No Pain: Pain Assessment Pain Scale: 0-10 Pain Score: 0-No pain   Therapy/Group: Individual Therapy  [name] 11/17/2020, 10:36 AM

## 2020-11-17 NOTE — Progress Notes (Signed)
Occupational Therapy Session Note  Patient Details  Name: [name] MRN: [id number] Date of Birth: 1946/01/14  Today's Date: 11/17/2020 OT Individual Time: 1110-1210 OT Individual Time Calculation (min): 60 min    Short Term Goals: Week 2:  OT Short Term Goal 1 (Week 2): Patient will complete UB bathing/dressing set up A from w/c or EOB. OT Short Term Goal 2 (Week 2): Patient will complete LB bathing mod A using AE PRN. OT Short Term Goal 3 (Week 2): Patient will complete LB dressing max A seated/in stance. OT Short Term Goal 4 (Week 2): Patient will complete toilet transfer using LRAD with mod A. OT Short Term Goal 5 (Week 2): Patient will complete ADL task using LUE to assist with spvsn and min vc's for initiation.    Skilled Therapeutic Interventions/Progress Updates:     Pt reports her confidence level has been low regarding balance and mobility since she broke her hip in December and recent LUE injury has made her feel more fearful of falling. She is agreeable to working on balance in rehab gym this session. Pt completed sliding board transfer w/c to EOM with max assist +2.  Also required max VCs and TCs to promote anterior trunk leaning and weight shifting to complete transfer successfully.  Pt completed blocked practice sit<>stand transfer at RW from [facility].  Pt initially hesitant to stand and pushing backwards, however with encouragement and step by step instruction, pt able to achieve full stance with max assist +2.  Pt able to stand approximately 5 second duration each interval and then needing to sit due to BLE weakness.  Pt completed sit<>stand x 5 trials.  Sliding board transfer EOM to w/c requiring max assist +2.  Pt then transported to [place] per pt request for fresh air while completing RUE strengthening due to restricted LUE status, pt requiring increased strength on right side to facilitate improved independence with functional mobility.  Pt completed 3 x 12 reps RUE lat  pull downs and ER rotations using medium resistance therapy band.  Pt transported back to room , dtr present, and completed sit<>stand with max assist at stedy w/c to EOB.  Max assist sit to supine as well.  Pt instructed in bridging technique to move towards [facility] and pt able to return demonstrate with increased time.  BUE supported with foam cushion.  Call bell in reach, bed alarm on at end of session.  Therapy Documentation Precautions:  Precautions Precautions: Fall Precaution Comments: L UE fistula with upper arm wound and pain Restrictions Weight Bearing Restrictions: No   Therapy/Group: Individual Therapy  Ezekiel Slocumb 11/17/2020, 3:01 PM

## 2020-11-18 NOTE — Progress Notes (Signed)
Nutrition Follow-up  DOCUMENTATION CODES:   Not applicable  INTERVENTION:  ContinueNepro Shake poBID, each supplement provides 425 kcal and 19 grams protein  Encourage adequate PO intake.  NUTRITION DIAGNOSIS:   Increased nutrient needs related to chronic illness (ESRD HD) as evidenced by estimated needs; ongoing  GOAL:   Patient will meet greater than or equal to 90% of their needs; progressing  MONITOR:   PO intake,Supplement acceptance,Skin,Weight trends,Labs,I & O's  REASON FOR ASSESSMENT:   Malnutrition Screening Tool    ASSESSMENT:   75 year old female with history of ESRD- HD TTS, HTN, PAF, glaucoma, OA, aortic valve stenosis, initially presents with large intramuscular biceps hematoma with concerns of compartment syndrome and was taken to OR on 10/27/20 for evacuation of hematoma and primary repair of left brachial artery pseudoaneurysm. Therapy ongoing and patient noted to have deficits due to LUE pain as well as  BLE weakness with instability. Pt with functional deficits due to myopathy/B/L foot drop. CIR due to functional decline.  Meal completion has been 75-100% with 100% at breakfast this morning. Pt currently has Nepro shake ordered with varied consumption. RD to continue with current orders to aid in caloric and protein needs. Labs and medications reviewed.   Diet Order:   Diet Order            Diet renal with fluid restriction Fluid restriction: 1200 mL Fluid; Room service appropriate? Yes; Fluid consistency: Thin  Diet effective now                 EDUCATION NEEDS:   Not appropriate for education at this time  Skin:  Skin Assessment: Reviewed RN Assessment  Last BM:  5/31  Height:   Ht Readings from Last 1 Encounters:  10/26/20 5\' 3"  (1.6 m)    Weight:   Wt Readings from Last 1 Encounters:  11/18/20 77.4 kg   BMI:  Body mass index is 30.23 kg/m.  Estimated Nutritional Needs:   Kcal:  1850-2050  Protein:  85-100  grams  Fluid:  1.2 L/day  Corrin Parker, MS, RD, LDN RD pager number/after hours weekend pager number on Amion.

## 2020-11-18 NOTE — Patient Care Conference (Signed)
Inpatient RehabilitationTeam Conference and Plan of Care Update Date: 11/18/2020   Time: 11:43 AM    Patient Name: [name]      Medical Record Number: [id number]  Date of Birth: 1945/11/14 Sex: Female         Room/Bed: 4W02C/4W02C-01 Payor Info: Payor: [place] / Plan: BCBS MEDICARE / Product Type: *No Product type* /    Admit Date/Time:  11/03/2020  1:38 PM  Primary Diagnosis:  Intensive care (ICU) myopathy  Hospital Problems: Principal Problem:   Intensive care (ICU) myopathy Active Problems:   Debility   ESRD on dialysis ([place])   Acute on chronic anemia   Post-operative pain   Chronic UTI   Loose stools    Expected Discharge Date: Expected Discharge Date: 12/02/20  Team Members Present: Physician leading conference: Dr. Delice Lesch Care Coodinator Present: Dorien Chihuahua, RN, BSN, CRRN;Becky Dupree, LCSW Nurse Present: Dorien Chihuahua, RN PT Present: Magda Kiel, PT OT Present: Other (comment) Hulda Marin, OT) PPS Coordinator present : Gunnar Fusi, SLP     Current Status/Progress Goal Weekly Team Focus  Bowel/Bladder   Pt incontinent of B/B. LBM 11/17/2020  Patient will regain functional continence  Regular toileting   Swallow/Nutrition/ Hydration             ADL's   mod A bed mobility and transfers (slide board and stedy), UB adls spvsn-min A, LB adls max-total A, toileting max A  min A  adl training, functional transfers, conditioning, LUE FMC and conditioning, activity tolerance, DME/AE   Mobility   mod A transfers (slide board mainly, occasional stand step) and bed mobility, gait up to 10' with EVA walker  CGA/min A w/c level  progressing sitting balance/trunk strength, slide board transfers with head/hips relationship, standing tolerance and gait as able   Communication             Safety/Cognition/ Behavioral Observations            Pain   C/o of pain on left arm. PRN medication administered.  pain <3/10  Assess Qshift and PRN    Skin   Abrasion and ecchymosis on left rib cage and flank. Post operative wound on left arm. small open area in intergluteal space, foam in place.  promote healing and prevention of infection.  Assess Qshift and PRN     Discharge Planning:  Home with daughter whom she has been living with and she was assisting with her care prior to admission. Will need family education prior to DC   Team Discussion: Patient ambulated 7' with an eva walker. Progress limited by left upper extremity weakness, left hand numbness, dis-coordination/fine motor deficits on left side and fatigue. Staff report patient very emotional; upset with lack of progress made with therapy.  Patient on target to meet rehab goals: Currently CGA for w/c level care and using slide board for transfers. Supervision - min assist for upper body care and max assist - total assist for lower body with adaptive equipment and max assist for toileting.  *See Care Plan and progress notes for long and short-term goals.   Revisions to Treatment Plan:   Teaching Needs: Toileting, transfers, medications, etc.   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 secondary to prolonged hospitalization/recent hospitalization for LUE thrombus s/p thrombectomy  Barriers to Discharge:  Medical stability;Incontinence   Possible Resolutions to [employer] Focus: Therapies, optimize pain meds, follow labs - Hb, abx for chronic UTI   Continued Need for Acute Rehabilitation Level of Care: The patient requires daily medical management by a physician with specialized training in physical medicine and rehabilitation for the following reasons: Direction of a multidisciplinary physical rehabilitation program to maximize functional  independence : Yes Medical management of patient stability for increased activity during participation in an intensive rehabilitation regime.: Yes Analysis of laboratory values and/or radiology reports with any subsequent need for medication adjustment and/or medical intervention. : Yes   I attest that I was present, lead the team conference, and concur with the assessment and plan of the team.   Dorien Chihuahua B 11/18/2020, 3:58 PM

## 2020-11-18 NOTE — Progress Notes (Signed)
Physical Therapy Session Note  Patient Details  Name: [name] MRN: [id number] Date of Birth: September 04, 1945  Today's Date: 11/18/2020 PT Individual Time: 1500-1525 PT Individual Time Calculation (min): 25 min   Short Term Goals: Week 2:  PT Short Term Goal 1 (Week 2): Patient to perform sit to stand with mod A from elevated height consistently. PT Short Term Goal 2 (Week 2): Patient will perform w/c mobility with S x 60'. PT Short Term Goal 3 (Week 2): Patient wil ambulated with RW and mod A x 15' (+2 for w/c follow if needed) PT Short Term Goal 4 (Week 2): Patient to perform slide board transfers with mod A at least 50% of the time.  Skilled Therapeutic Interventions/Progress Updates:    Patient received supine in bed, agreeable to PT. She denies pain, but endorses nausea. Patient able to come sit edge of bed with ModA. Sit <> stand into Stedy with initially ModA + CGA, but progressed to MinA x1 when perched in [place]. Patient requiring rest breaks between bouts due to fatigue. She returned supine with MaxA. Bed alarm on, call light within reach.   Therapy Documentation Precautions:  Precautions Precautions: Fall Precaution Comments: L UE fistula with upper arm wound and pain Restrictions Weight Bearing Restrictions: No    Therapy/Group: Individual Therapy  Karoline Caldwell, PT, DPT, CBIS  11/18/2020, 7:48 AM

## 2020-11-18 NOTE — Progress Notes (Signed)
Occupational Therapy Weekly Progress Note  Patient Details  Name: [name] MRN: [id number] Date of Birth: 11-07-1945  Beginning of progress report period: Nov 11, 2020 End of progress report period: November 18, 2020  Today's Date: 11/18/2020 OT Individual Time: 0850-1000 OT Individual Time Calculation (min): 70 min    Patient has met 2 of 5 short term goals. Pt continues to be limited by general muscle weakness, poor static and dynamic balance, endurance deficits, and some limited functional use of LUE (hand). Pt is making slow progress towards goals, but continues to need mod-max A for LB adls despite introduction of AE.   Patient continues to demonstrate the following deficits: muscle weakness, decreased cardiorespiratoy endurance, decreased midline orientation and decreased sitting balance, decreased standing balance, decreased postural control, decreased balance strategies and LUE weakness, coordination, and sensation limitations and therefore will continue to benefit from skilled OT intervention to enhance overall performance with BADL and Reduce care partner burden.  Patient progressing toward long term goals..  Continue plan of care.  OT Short Term Goals Week 1:  OT Short Term Goal 1 (Week 1): Pt will complete UB dressing and bathing with supervision EOB or from wheelchair. OT Short Term Goal 1 - Progress (Week 1): Progressing toward goal OT Short Term Goal 2 (Week 1): Pt will complete LB bathing with max assist sit to stand for two consecutive sessions. OT Short Term Goal 2 - Progress (Week 1): Partly met OT Short Term Goal 3 (Week 1): Pt will donn pants with max assist sit to stand OT Short Term Goal 3 - Progress (Week 1): Partly met OT Short Term Goal 4 (Week 1): Pt will complete stand pivot transfer to the 3:1 with mod assist using the RW for support. OT Short Term Goal 4 - Progress (Week 1): Not met OT Short Term Goal 5 (Week 1): Pt will use the LUE to wash the RUE with  supervision and min instructional cueing for initiation. OT Short Term Goal 5 - Progress (Week 1): Progressing toward goal  Skilled Therapeutic Interventions/Progress Updates:    Pt received supine in bed, agreeable to OT session, reporting no pain; AE for ADLs obtained for UB/LB dressing. Sup>sit mod A and increased time with vc's for body mechanics. Scooting to EOB mod A. Sitting EOB, pt required mod vc's to maintain static and dynamic sitting balance due to L leaning. Pt completed UB dressing min A to unfasten buttons; LB dressing max A using reacher sitting EOB and standing in Stedy (dependent to pull pants over hips in stance). Donning ted hose and shoes total A. Pt education on use of sock aide; needs reinforcement and practice. Pt became emotional about CLOF; therapeutic listening and encouragement provided with improved affect. Sit<>stand mod A +2 using Stedy and transfer bed>w/c>recliner; stood for ~5 seconds in [place] before needing seated rest break. Pt taken to gym to work on [facility] tasks focusing especially on LUE (digits 1-3) with min vc's for compensatory movements. Pt remained in recliner on roho cushion and positioned with pillows/egg crate, call bell in reach, and all needs met, dtr present.   Therapy Documentation Precautions:  Precautions Precautions: Fall Precaution Comments: L UE fistula with upper arm wound and pain Restrictions Weight Bearing Restrictions: No Pain: Pain Assessment Pain Scale: 0-10 Pain Score: 0-No pain Faces Pain Scale: Hurts little more Pain Type: Surgical pain Pain Location: Arm Pain Orientation: Left Pain Descriptors / Indicators: Aching   Therapy/Group: Individual Therapy  [name] 11/18/2020,  10:23 AM

## 2020-11-18 NOTE — Progress Notes (Signed)
Patient ID: [name], female   DOB: Feb 05, 1946, 75 y.o.   MRN: [id number]  Met with pt and daughter via telephone to discuss team conference progress this week and discharge target is still 6/15. Pt not feeling well and it is [birth year] today. She feels she should be doing better than she is. Daughter very supportive and will come in closer to discharge for education, but does feel would benefit from seeing neuro-psych while here.

## 2020-11-18 NOTE — Progress Notes (Signed)
Occupational Therapy Note  Patient Details  Name: [name] MRN: [id number] Date of Birth: 01-Jan-1946  Today's Date: 11/18/2020 OT Missed Time: 58 Minutes Missed Time Reason: Patient ill (comment) (n/v)  Pt missed 45 mins skilled OT services. Pt c/o nausea and unable to participate. Will check back later.   Leotis Shames [facility] 11/18/2020, 1:22 PM

## 2020-11-18 NOTE — Progress Notes (Signed)
[facility] PHYSICAL MEDICINE & REHABILITATION PROGRESS NOTE  Subjective/Complaints: Patient seen sitting up in bed this morning.  She states she slept well overnight.  She denies complaints.  She is seen by nephrology yesterday, notes reviewed- no changes, HD yesterday.  ROS: Denies CP, SOB, N/V/D  Objective: Vital Signs: Blood pressure (!) 122/49, pulse 66, temperature 98 F (36.7 C), resp. rate 17, weight 77.4 kg, last menstrual period 01/19/1992, SpO2 97 %. No results found. Recent Labs    11/17/20 0445  WBC 7.2  HGB 10.2*  HCT 33.1*  PLT 209   Recent Labs    11/17/20 0445  NA 135  K 4.1  CL 97*  CO2 28  GLUCOSE 82  BUN 28*  CREATININE 4.68*  CALCIUM 8.0*    Intake/Output Summary (Last 24 hours) at 11/18/2020 1012 Last data filed at 11/18/2020 0834 Gross per 24 hour  Intake 240 ml  Output 1800 ml  Net -1560 ml        Physical Exam: BP (!) 122/49 (BP Location: Right Arm)   Pulse 66   Temp 98 F (36.7 C)   Resp 17   Wt 77.4 kg   LMP 01/19/1992 (Approximate)   SpO2 97%   BMI 30.23 kg/m  Constitutional: No distress . Vital signs reviewed. HENT: Normocephalic.  Atraumatic. Eyes: EOMI. No discharge. Cardiovascular: No JVD.  RRR.  + Murmur. Respiratory: Normal effort.  No stridor.  Bilateral clear to auscultation. GI: Non-distended.  BS +. Skin: Warm and dry.   Right chest wall catheter CDI Left upper extremity with dressing CDI Psych: Normal mood.  Normal behavior. Musc: Left upper extremity with edema and tenderness, stable Left lower extreme edema, stable Neuro: Alert Motor: RUE: 4-/5 proximal to distal LUE: 3/5 proximal to distal pain inhibition, unchanged B/l LE: HF, KE, ADF 4/5, unchanged  Assessment/Plan: 1. Functional deficits which require 3+ hours per day of interdisciplinary therapy in a comprehensive inpatient rehab setting.  Physiatrist is providing close team supervision and 24 hour management of active medical problems listed  below.  Physiatrist and rehab team continue to assess barriers to discharge/monitor patient progress toward functional and medical goals   Care Tool:  Bathing    Body parts bathed by patient: Left arm,Abdomen,Chest,Right upper leg,Left upper leg,Face,Right lower leg   Body parts bathed by helper: Right arm,Left lower leg,Buttocks,Front perineal area     Bathing assist Assist Level: Moderate Assistance - Patient 50 - 74%     Upper Body Dressing/Undressing Upper body dressing   What is the patient wearing?: Pull over shirt    Upper body assist Assist Level: Moderate Assistance - Patient 50 - 74%    Lower Body Dressing/Undressing Lower body dressing      What is the patient wearing?: Pants     Lower body assist Assist for lower body dressing: Total Assistance - Patient < 25%     Toileting Toileting    Toileting assist Assist for toileting: Total Assistance - Patient < 25%     Transfers Chair/bed transfer  Transfers assist  Chair/bed transfer activity did not occur:  (sliding board)  Chair/bed transfer assist level: Moderate Assistance - Patient 50 - 74% Chair/bed transfer assistive device: Sliding board   Locomotion Ambulation   Ambulation assist   Ambulation activity did not occur: Safety/medical concerns  Assist level: 2 helpers Assistive device: Walker-rolling Max distance: 8'   Walk 10 feet activity   Assist  Walk 10 feet activity did not occur: Safety/medical concerns  Walk 50 feet activity   Assist Walk 50 feet with 2 turns activity did not occur: Safety/medical concerns         Walk 150 feet activity   Assist Walk 150 feet activity did not occur: Safety/medical concerns         Walk 10 feet on uneven surface  activity   Assist Walk 10 feet on uneven surfaces activity did not occur: Safety/medical concerns         Wheelchair     Assist Will patient use wheelchair at discharge?: Yes Type of Wheelchair:  Manual    Wheelchair assist level: Minimal Assistance - Patient > 75% Max wheelchair distance: 30'    Wheelchair 50 feet with 2 turns activity    Assist        Assist Level: Minimal Assistance - Patient > 75%   Wheelchair 150 feet activity     Assist      Assist Level: Dependent - Patient 0%    Medical Problem List and Plan: 1.  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 Please see conference note from today as well.  2.  Antithrombotics: -DVT/anticoagulation:  Pharmaceutical: Other (comment)--renal dose Eliquis.              -antiplatelet therapy: N/A 3. Pain Management: Oxycodone prn for severe pain. Tramadol added prn for moderate pain.              Added low dose gabapentin for neuropathic pain   Controlled with meds on 6/1  Monitor with increased activity.  4. Mood: LCSW to follow for evaluation and support.              -antipsychotic agents: N/A 5. Neuropsych: This patient is capable of making decisions on his own behalf. 6. Skin/Wound Care:  Routine pressure relief measures.  7. Fluids/Electrolytes/Nutrition: Strict I/Os with daily weights. Renal diet/1200 cc FR.  8. Hematoma LUE s/p evacuation: Continue to monitor pulse and for symptoms. Also elevate LUE 9. Acute on chronic anemia: Improved post transfusion. Continue aransep weekly.   Hb 10.2 on 5/31 10. PAF: Continue amiodarone and Eliquis.   Controlled on 5/31, monitor TID  Monitor with increased exertion 11.  ESRD: HD on TTS after therapy to help with tolerance of therapy.  12. Chronic UTI: On low dose Keflex for suppression.  13. B/L foot drop- Prevalon boots for moderate B/L foot drop 14. Leukocytosis  WBCs 7.2  on 5/31, tends to fluctuate  Afebrile  Cont ro monitor 15.  Loose stools  Fiber added on 5/20   Improved  LOS: 15 days A FACE TO FACE EVALUATION WAS PERFORMED  Dajai Wahlert [name] 11/18/2020, 10:12 AM

## 2020-11-18 NOTE — Progress Notes (Signed)
  [facility] Progress Note   Dialysis Orders: [facility] TTS  4h 400/50071kg 2/2 bathP2 Hep none RIJ TDC (AVF works butinfiltrated and not using) -Mircera 75 q 2 wks (last 5/5) No VDRA  Assessment/ Plan:   1. LUEcompartment syndrome:evacuation of hematoma/ repair of bleeding brachial artery pseudoaneurysm on 5/10 per Dr. Cain/VVS. Pain control per PMD. 2. ESRD - HD TTS. HD Thursday.  3. Hypertension/volume - BPvariable but mostly wellcontrolled. No BP lowering meds on home list. 2+ LE edema, up 6kg. Max UF w/ HD tomorrow. Needs attention to fluid restriction.  4. Anemia of CKD/ABLA-HGB 9.2 11/03/2020 Rec'd Aranesp 100 mcg IV 11/03/2020.Hgb 6.5 at lowest here post-op.s/p2 unit prbcs 5/99. 5. Metabolic bone disease -CCa 10, phosin range w/o binders.No VDRA.Encourage nutrition. 6. Chronic combined CHF -- volume removal with UF on HD  7. PAFib- On Eliquis/amiodarone 8. Nutrition -Renal diet/fluid restriction.Encourage nutrition.Prot supp for low albumin 9. Debility -now in CIR.  Kelly Splinter, MD 11/18/2020, 9:49 AM    Subjective:   No c/o today   Objective:   BP (!) 122/49 (BP Location: Right Arm)   Pulse 66   Temp 98 F (36.7 C)   Resp 17   Wt 77.4 kg   LMP 01/19/1992 (Approximate)   SpO2 97%   BMI 30.23 kg/m   Intake/Output Summary (Last 24 hours) at 11/18/2020 0949 Last data filed at 11/18/2020 0834 Gross per 24 hour  Intake 240 ml  Output 1800 ml  Net -1560 ml   Weight change: -2.4 kg  Physical Exam: General:Pleasant elderly female in NAD JTTSV:X7,L3 3/6 systolic M. No R/G. Lungs:CTAB Abdomen:Active BS, NT Ext: 1-2+ pitting edema pretib and L hip area Dialysis Access:RIJ TDC with drsg CDI. Lt AVF +T/B, wrapped in multiple layers of gauze, strong thrill at the inflow  Imaging: No results found.  Labs: BMET Recent Labs  Lab 11/12/20 1302 11/14/20 1440 11/17/20 0445  NA 133* 135 135  K 4.1 3.4* 4.1  CL 98 102  97*  CO2 27 28 28   GLUCOSE 89 110* 82  BUN 24* 15 28*  CREATININE 4.52* 2.89* 4.68*  CALCIUM 7.8* 7.2* 8.0*  PHOS 2.6 1.7* 3.1   CBC Recent Labs  Lab 11/12/20 1302 11/14/20 1440 11/17/20 0445  WBC 10.2 6.2 7.2  NEUTROABS  --   --  6.2  HGB 9.6* 9.3* 10.2*  HCT 29.9* 29.7* 33.1*  MCV 98.0 100.3* 101.5*  PLT 213 144* 209    Medications:    . amiodarone  200 mg Oral Daily  . apixaban  2.5 mg Oral BID  . cephALEXin  250 mg Oral q1800  . Chlorhexidine Gluconate Cloth  6 each Topical Q0600  . darbepoetin (ARANESP) injection - DIALYSIS  100 mcg Intravenous Q Thu-HD  . diclofenac Sodium  2 g Topical QID  . famotidine  20 mg Oral Daily  . feeding supplement (NEPRO CARB STEADY)  237 mL Oral BID BM  . gabapentin  300 mg Oral QHS  . latanoprost  1 drop Both Eyes QHS  . lidocaine-prilocaine  1 application Topical Once per day on Mon Wed Fri  . multivitamin  1 tablet Oral QHS  . polycarbophil  625 mg Oral BID AC  . saccharomyces boulardii  250 mg Oral BID  . timolol  1 drop Both Eyes Daily

## 2020-11-18 NOTE — Progress Notes (Signed)
Occupational Therapy Session Note  Patient Details  Name: [name] MRN: [id number] Date of Birth: 03-23-1946  Today's Date: 11/18/2020 OT Missed Time: 26 Minutes Missed Time Reason: Patient ill (comment) (nausea)   Short Term Goals: Week 2:  OT Short Term Goal 1 (Week 2): Patient will complete UB bathing/dressing set up A from w/c or EOB. OT Short Term Goal 2 (Week 2): Patient will complete LB bathing mod A using AE PRN. OT Short Term Goal 3 (Week 2): Patient will complete LB dressing max A seated/in stance. OT Short Term Goal 4 (Week 2): Patient will complete toilet transfer using LRAD with mod A. OT Short Term Goal 5 (Week 2): Patient will complete ADL task using LUE to assist with spvsn and min vc's for initiation.  Skilled Therapeutic Interventions/Progress Updates:    Pt received in bed with her dtr in the room. Pt had just gotten back to bed and stated she was very nauseated. Pt looked pale and felt she could not participate at this time.    Therapy Documentation Precautions:  Precautions Precautions: Fall Precaution Comments: L UE fistula with upper arm wound and pain Restrictions Weight Bearing Restrictions: No  Pain: Pain Assessment Pain Scale: 0-10 Pain Score: 0-No pain Faces Pain Scale: Hurts little more Pain Type: Surgical pain Pain Location: Arm Pain Orientation: Left Pain Descriptors / Indicators: Aching ADL: ADL Eating: Set up Where Assessed-Eating: Wheelchair Grooming: Minimal assistance Where Assessed-Grooming: Wheelchair Upper Body Bathing: Minimal assistance Where Assessed-Upper Body Bathing: Edge of bed Lower Body Bathing: Dependent Where Assessed-Lower Body Bathing: Edge of bed Upper Body Dressing: Minimal assistance Where Assessed-Upper Body Dressing: Edge of bed Lower Body Dressing: Dependent Where Assessed-Lower Body Dressing: Edge of bed Toileting: Not assessed Toilet Transfer Method: Not assessed Tub/Shower Transfer: Not  assessed Social research officer, government: Not assessed   Therapy/Group: Individual Therapy  [place] 11/18/2020, 12:05 PM

## 2020-11-19 DIAGNOSIS — R0989 Other specified symptoms and signs involving the circulatory and respiratory systems: Secondary | ICD-10-CM

## 2020-11-19 MED ORDER — DARBEPOETIN ALFA 100 MCG/0.5ML IJ SOSY
PREFILLED_SYRINGE | INTRAMUSCULAR | Status: AC
  Administered 2020-11-19: 100 ug via INTRAVENOUS
  Filled 2020-11-19: qty 0.5

## 2020-11-19 MED ORDER — ACETAMINOPHEN 325 MG PO TABS
ORAL_TABLET | ORAL | Status: AC
  Administered 2020-11-19: 650 mg via ORAL
  Filled 2020-11-19: qty 2

## 2020-11-19 MED ORDER — HEPARIN SODIUM (PORCINE) 1000 UNIT/ML IJ SOLN
INTRAMUSCULAR | Status: AC
  Administered 2020-11-19: 3200 [IU]
  Filled 2020-11-19: qty 4

## 2020-11-19 NOTE — Progress Notes (Signed)
  [facility] Progress Note   Dialysis Orders: [facility] TTS  4h 400/50071kg 2/2 bathP2 Hep none RIJ TDC (AVF works butinfiltrated and not using) -Mircera 75 q 2 wks (last 5/5) No VDRA  Assessment/ Plan:   1. LUEcompartment syndrome:evacuation of hematoma/ repair of bleeding brachial artery pseudoaneurysm on 5/10 per Dr. Cain/VVS. Pain control per PMD. 2. ESRD - HD TTS. HD today.  3. Hypertension/volume - up 5- 6kg w/ LE edema. BP's okay. BPvariable but mostly wellcontrolled. No BP lowering meds at home or here. Max UF w/ HD today and have encouraged fluid restriction.  4. Anemia of CKD/ABLA-HGB 9.2 11/03/2020 Rec'd Aranesp 100 mcg IV 11/03/2020.Hgb 6.5 at lowest here post-op.s/p2 unit prbcs 7/85. 5. Metabolic bone disease -CCa 10, phosin range w/o binders.No VDRA.Encourage nutrition. 6. Chronic combined CHF -- volume removal with UF on HD  7. PAFib- On Eliquis/amiodarone 8. Nutrition -Renal diet/fluid restriction.Encourage nutrition.Prot supp for low albumin 9. Debility -now in CIR.  Kelly Splinter, MD 11/19/2020, 11:38 AM    Subjective:   No c/o, seen on HD    Objective:   BP (!) 161/67 (BP Location: Right Arm)   Pulse 67   Temp (!) 97.5 F (36.4 C)   Resp 18   Wt 76.6 kg   LMP 01/19/1992 (Approximate)   SpO2 99%   BMI 29.91 kg/m   Intake/Output Summary (Last 24 hours) at 11/19/2020 1138 Last data filed at 11/19/2020 0736 Gross per 24 hour  Intake 460 ml  Output --  Net 460 ml   Weight change: 0.5 kg  Physical Exam: General:Pleasant elderly female in NAD YIFOY:D7,A1 3/6 systolic M. No R/G. Lungs:CTAB Abdomen:Active BS, NT Ext: 1-2+ pitting edema pretib and L hip area Dialysis Access:RIJ TDC with drsg CDI. Lt AVF +T/B, wrapped in multiple layers of gauze, strong thrill at the inflow  Imaging: No results found.  Labs: BMET Recent Labs  Lab 11/12/20 1302 11/14/20 1440 11/17/20 0445  NA 133* 135 135  K 4.1 3.4*  4.1  CL 98 102 97*  CO2 27 28 28   GLUCOSE 89 110* 82  BUN 24* 15 28*  CREATININE 4.52* 2.89* 4.68*  CALCIUM 7.8* 7.2* 8.0*  PHOS 2.6 1.7* 3.1   CBC Recent Labs  Lab 11/12/20 1302 11/14/20 1440 11/17/20 0445  WBC 10.2 6.2 7.2  NEUTROABS  --   --  6.2  HGB 9.6* 9.3* 10.2*  HCT 29.9* 29.7* 33.1*  MCV 98.0 100.3* 101.5*  PLT 213 144* 209    Medications:    . amiodarone  200 mg Oral Daily  . apixaban  2.5 mg Oral BID  . cephALEXin  250 mg Oral q1800  . Chlorhexidine Gluconate Cloth  6 each Topical Q0600  . darbepoetin (ARANESP) injection - DIALYSIS  100 mcg Intravenous Q Thu-HD  . diclofenac Sodium  2 g Topical QID  . famotidine  20 mg Oral Daily  . feeding supplement (NEPRO CARB STEADY)  237 mL Oral BID BM  . gabapentin  300 mg Oral QHS  . latanoprost  1 drop Both Eyes QHS  . lidocaine-prilocaine  1 application Topical Once per day on Mon Wed Fri  . multivitamin  1 tablet Oral QHS  . polycarbophil  625 mg Oral BID AC  . saccharomyces boulardii  250 mg Oral BID  . timolol  1 drop Both Eyes Daily

## 2020-11-19 NOTE — Progress Notes (Signed)
[facility] PHYSICAL MEDICINE & REHABILITATION PROGRESS NOTE  Subjective/Complaints: Patient seen sitting up in bed this AM.  She states she slept well overnight.  She is working with therapies.   ROS: Denies CP, SOB, N/V/D  Objective: Vital Signs: Blood pressure (!) 161/67, pulse 67, temperature (!) 97.5 F (36.4 C), resp. rate 18, weight 76.6 kg, last menstrual period 01/19/1992, SpO2 99 %. No results found. Recent Labs    11/17/20 0445  WBC 7.2  HGB 10.2*  HCT 33.1*  PLT 209   Recent Labs    11/17/20 0445  NA 135  K 4.1  CL 97*  CO2 28  GLUCOSE 82  BUN 28*  CREATININE 4.68*  CALCIUM 8.0*    Intake/Output Summary (Last 24 hours) at 11/19/2020 0935 Last data filed at 11/19/2020 0736 Gross per 24 hour  Intake 460 ml  Output --  Net 460 ml        Physical Exam: BP (!) 161/67 (BP Location: Right Arm)   Pulse 67   Temp (!) 97.5 F (36.4 C)   Resp 18   Wt 76.6 kg   LMP 01/19/1992 (Approximate)   SpO2 99%   BMI 29.91 kg/m  Constitutional: No distress . Vital signs reviewed. HENT: Normocephalic.  Atraumatic. Eyes: EOMI. No discharge. Cardiovascular: No JVD.  RRR. +Murmur.  Respiratory: Normal effort.  No stridor.  Bilateral clear to auscultation. GI: Non-distended.  BS+.  Skin: Warm and dry.   Right chest wall catheter CDI Left upper extremity with dressing CDI Psych: Normal mood.  Normal behavior. Musc: Left upper extremity with edema and tenderness, unchanged Left lower extreme edema, unchanged Neuro: Alert Motor: RUE: 4-/5 proximal to distal LUE: 3-3+/5 proximal to distal pain inhibition, stable B/l LE: HF 3/5, KE, ADF 4/5  Assessment/Plan: 1. Functional deficits which require 3+ hours per day of interdisciplinary therapy in a comprehensive inpatient rehab setting.  Physiatrist is providing close team supervision and 24 hour management of active medical problems listed below.  Physiatrist and rehab team continue to assess barriers to  discharge/monitor patient progress toward functional and medical goals   Care Tool:  Bathing    Body parts bathed by patient: Left arm,Abdomen,Chest,Right upper leg,Left upper leg,Face,Right lower leg   Body parts bathed by helper: Right arm,Left lower leg,Buttocks,Front perineal area     Bathing assist Assist Level: Moderate Assistance - Patient 50 - 74%     Upper Body Dressing/Undressing Upper body dressing   What is the patient wearing?: Pull over shirt    Upper body assist Assist Level: Minimal Assistance - Patient > 75%    Lower Body Dressing/Undressing Lower body dressing      What is the patient wearing?: Pants     Lower body assist Assist for lower body dressing: Maximal Assistance - Patient 25 - 49%     Toileting Toileting    Toileting assist Assist for toileting: Total Assistance - Patient < 25%     Transfers Chair/bed transfer  Transfers assist  Chair/bed transfer activity did not occur:  (sliding board)  Chair/bed transfer assist level: Maximal Assistance - Patient 25 - 49% (stedy) Chair/bed transfer assistive device: Mechanical lift (stedy)   Locomotion Ambulation   Ambulation assist   Ambulation activity did not occur: Safety/medical concerns  Assist level: 2 helpers Assistive device: Walker-rolling Max distance: 8'   Walk 10 feet activity   Assist  Walk 10 feet activity did not occur: Safety/medical concerns        Walk 50 feet  activity   Assist Walk 50 feet with 2 turns activity did not occur: Safety/medical concerns         Walk 150 feet activity   Assist Walk 150 feet activity did not occur: Safety/medical concerns         Walk 10 feet on uneven surface  activity   Assist Walk 10 feet on uneven surfaces activity did not occur: Safety/medical concerns         Wheelchair     Assist Will patient use wheelchair at discharge?: Yes Type of Wheelchair: Manual    Wheelchair assist level: Minimal  Assistance - Patient > 75% Max wheelchair distance: 30'    Wheelchair 50 feet with 2 turns activity    Assist        Assist Level: Minimal Assistance - Patient > 75%   Wheelchair 150 feet activity     Assist      Assist Level: Dependent - Patient 0%    Medical Problem List and Plan: 1.  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  Antithrombotics: -DVT/anticoagulation:  Pharmaceutical: Other (comment)--renal dose Eliquis.              -antiplatelet therapy: N/A 3. Pain Management: Oxycodone prn for severe pain. Tramadol added prn for moderate pain.              Added low dose gabapentin for neuropathic pain   Controlled with meds on 6/2  Monitor with increased activity.  4. Mood: LCSW to follow for evaluation and support.              -antipsychotic agents: N/A 5. Neuropsych: This patient is capable of making decisions on his own behalf. 6. Skin/Wound Care:  Routine pressure relief measures.  7. Fluids/Electrolytes/Nutrition: Strict I/Os with daily weights. Renal diet/1200 cc FR.  8. Hematoma LUE s/p evacuation: Continue to monitor pulse and for symptoms. Also elevate LUE 9. Acute on chronic anemia: Improved post transfusion. Continue aransep weekly.   Hb 10.2 on 5/31, labs with HD 10. PAF: Continue amiodarone and Eliquis.   Controlled on 6/2, monitor TID  Monitor with increased exertion 11.  ESRD: HD on TTS after therapy to help with tolerance of therapy.  12. Chronic UTI: On low dose Keflex for suppression.  13. B/L foot drop- Prevalon boots for moderate B/L foot drop 14. Leukocytosis  WBCs 7.2 on 5/31, labs with HD, tends to fluctuate  Afebrile  Cont ro monitor 15.  Loose stools  Fiber added on 5/20   Improved 16. Labile blood pressure  Monitor for trend  LOS: 16 days A FACE TO FACE EVALUATION WAS PERFORMED  [name] [name] 11/19/2020,  9:35 AM

## 2020-11-19 NOTE — Progress Notes (Signed)
Physical Therapy Session Note  Patient Details  Name: [name] MRN: [id number] Date of Birth: 1946-03-24  Today's Date: 11/19/2020 PT Individual Time: 0800-0914 PT Individual Time Calculation (min): 74 min   Short Term Goals: Week 2:  PT Short Term Goal 1 (Week 2): Patient to perform sit to stand with mod A from elevated height consistently. PT Short Term Goal 2 (Week 2): Patient will perform w/c mobility with S x 60'. PT Short Term Goal 3 (Week 2): Patient wil ambulated with RW and mod A x 15' (+2 for w/c follow if needed) PT Short Term Goal 4 (Week 2): Patient to perform slide board transfers with mod A at least 50% of the time.  Skilled Therapeutic Interventions/Progress Updates: Pt presents supine in bed and agreeable to therapy.  Pt states feeling good today.  Pt required Total A for donning TED hose.  PT threaded pants over feet, and then pt pulled over knees and to hips once assisted into hook-lying position.  Pt rolled B sides for total A to bring pants over hips.  Pt required mod A for sup to sit transfers and then to EOB.  Pt sat for assist to don Crocs.  Pt able to doff shirt w/ min A and then donned shirt.  Pt performed lateral scoot w/ max to mod A bed > w/c.  Pt requires increased verbal cues for forward lean to improve , but pt fearful of falling.  Pt wheeled self x 50' using BUE and LEs and min A. Pt performed Kinetron 3 x 1' each trial at 55 cm/sec. w/ rest break between, verbal cues for full movement up/down.   Pt performed sit to stand in // bars multiple trials w/ assist of 2 and blocking R knee.  Pt resistant to forward lean, cueing for "looking over shoulder".  Pt stood x 30 sec before c/o R knee giving out, unable to control eccentric movement.  Pt required A of 2 for SB w/c <> mat table.  Pt sat EOM for reaching overhead w/ mirror to maintain mid-line sitting.  Pt remained sitting in w/c w/ chair alarm on and all needs in reach.     Therapy Documentation Precautions:   Precautions Precautions: Fall Precaution Comments: L UE fistula with upper arm wound and pain Restrictions Weight Bearing Restrictions: No General:   Vital Signs:  Pain:no c/o pain. Pain Assessment Pain Scale: 0-10 Pain Score: 0-No pain Mobility:      Therapy/Group: Individual Therapy  Ladoris Gene 11/19/2020, 10:29 AM

## 2020-11-19 NOTE — Progress Notes (Signed)
Occupational Therapy Session Note  Patient Details  Name: [name] MRN: [id number] Date of Birth: March 26, 1946  1000-1100 60 min  Short Term Goals: Week 3:  OT Short Term Goal 1 (Week 3): Patient will complete UB bathing/dressing set up A using AE PRN from w/c or EOB. OT Short Term Goal 2 (Week 3): Patient will complete LB bathing mod A using AE PRN. OT Short Term Goal 3 (Week 3): Patient will complete LB dressing mod A using AE PRN. OT Short Term Goal 4 (Week 3): Patient will complete 2/3 toileting tasks mod A using LRAD. OT Short Term Goal 5 (Week 3): Pt will complete toilet transfer mod A consistently using LRAD.  Skilled Therapeutic Interventions/Progress Updates:    Pt received seated in w/c, agreeable to OT, and reporting no pain. Pt expressed she had difficulties in earlier PT session standing and was discouraged. Therapist provided therapeutic listening, encouragement on progress, and went over progress made in chart which pt reported somewhat helped. Sit<>stand using Stedy mod A and vc's for improved posture and anterior weight shift with difficulties with controlled decent to therapy mat. Seated on edge of mat with BLEs supported, pt engaged in postural control activities , trunk and BUE stretches, and conditioning involving scapular retraction, lateral leaning, trunk rotation/extention, and anterior weight shift in preparation for improved independence in functional transfers and to reduce caregiver burden. Pt performed lateral scooting with vc's for body mechanics and min A. Pt engaged in simulated w/c pushups to lift buttocks off matt using yoga blocks under BUEs while reciting alphabet; pt performed 6x each for ~6-8 seconds with multiple rest breaks. Pt reported pain in L wrist (see below) and discontinued. Slide board transfer mat>w/c min A with increased time and vc's for body mechanics/anterior weight shift. Pt remained seated in w/c at end of session, alarm set, call bell in  reach, and all needs met.  Recreational therapy consult recommended.  Therapy Documentation Precautions:  Precautions Precautions: Fall Precaution Comments: L UE fistula with upper arm wound and pain Restrictions Weight Bearing Restrictions: No Pain: Pain Assessment Pain Scale: 0-10 Pain Score: 4  Pain Type: Acute pain Pain Location: Wrist Pain Orientation: Left Pain Descriptors / Indicators: Aching;Throbbing Pain Onset: With Activity Pain Intervention(s): Rest;Distraction;Repositioned   Therapy/Group: Individual Therapy  [name] 11/19/2020, 12:14 PM

## 2020-11-19 NOTE — Progress Notes (Signed)
Physical Therapy Weekly Progress Note  Patient Details  Name: [name] MRN: [id number] Date of Birth: [birth year]  Beginning of progress report period: Nov 11, 2020 End of progress report period: November 19, 2020  Today's Date: 11/19/2020 PT Individual Time: 1105-1210 PT Individual Time Calculation (min): 65 min   Patient has met 1 of 4 short term goals.  Patient progressing towards all goals this week.  She has been able to perform sit to stand with mod A from elevated surface, but not consistently due to limited endurance and needs increased assist when fatigued.  She has ambulated up to 10-12' with eva walker and +2 for w/c follow, but limited by knee buckling.  She is improving with slide board transfers completing them with closer to min A today with over the back technique for improving anterior weight shift.    Patient continues to demonstrate the following deficits muscle weakness and decreased sitting balance, decreased standing balance and decreased postural control and therefore will continue to benefit from skilled PT intervention to increase functional independence with mobility.  Patient progressing toward long term goals..  Continue plan of care.  PT Short Term Goals Week 2:  PT Short Term Goal 1 (Week 2): Patient to perform sit to stand with mod A from elevated height consistently. PT Short Term Goal 1 - Progress (Week 2): Progressing toward goal PT Short Term Goal 2 (Week 2): Patient will perform w/c mobility with S x 60'. PT Short Term Goal 2 - Progress (Week 2): Progressing toward goal PT Short Term Goal 3 (Week 2): Patient wil ambulated with RW and mod A x 15' (+2 for w/c follow if needed) PT Short Term Goal 3 - Progress (Week 2): Progressing toward goal PT Short Term Goal 4 (Week 2): Patient to perform slide board transfers with mod A at least 50% of the time. PT Short Term Goal 4 - Progress (Week 2): Met Week 3:  PT Short Term Goal 1 (Week 3): Patient to perform w/c  mobility 80' with S. PT Short Term Goal 2 (Week 3): Patient to ambulate 24' with LRAD and mod A w/ +2 for w/c follow if needed. PT Short Term Goal 3 (Week 3): Patient to perform slide board transfers with min A consistently PT Short Term Goal 4 (Week 3): Patient to perform sit to stand with mod A from elevated surface.  Skilled Therapeutic Interventions/Progress Updates:  Patient in w/c and reports already doing a lot of standing and having trouble in the parallel bars this morning.  Agreeable to attempt standing in [place].  Patient attempted x 3 with max A and with sheet under hips to assist with liftoff, but unable with +1 A.  NT in to assist and performed sit to stand with mod A +2.  Patient performed sit to stand from elevated seat on Stedy x 2 and c/o knees feeling shaky.  Patient in w/c assisted to dayroom.  Performed slide board transfer to mat using over the back technique with min to mod A.  Seated EOM for anterior pelvic tilt and scapular retraction x 5.  Attempted to maintain with lateral weight shifts with cues.  Lateral reciprocal scooting forward then back.  Patient using weighted 1kg ball for core strength moving hip to hip, then knee to opposite shoulder then in and out x 10 each. Patient seated for hip abduction with orange band around knees x 10, hamstring curls with orange t-band x 10, and hip flexion x 10.  Transferred  back to w/c with min A using slide board.  Patient assisted to room and performed sit to stand to [facility] with mod to max A.  Patient stood from stedy after moved to EOB to doff pants.  Sit to supine with mod A.  Rolled with min A for changing brief and for perineal hygiene.  Patient assisted to don pants in supine with max A.  Scooted to [facility] using headboard with min A bed in trendelenberg.  Patient left with HOB up and daughter in the room with lunch tray arriving.   Ambulation/gait training;DME/adaptive equipment instruction;Neuromuscular re-education;Psychosocial  support;UE/LE Strength taining/ROM;Wheelchair propulsion/positioning;UE/LE Coordination activities;Therapeutic Activities;Skin care/wound management;Pain management;Functional electrical stimulation;Discharge planning;Balance/vestibular training;Functional mobility training;Therapeutic Exercise;Patient/family education;Splinting/orthotics   Therapy Documentation Precautions:  Precautions Precautions: Fall Precaution Comments: L UE fistula with upper arm wound and pain Restrictions Weight Bearing Restrictions: No Pain: Pain Assessment Pain Scale: 0-10 Pain Score: 4  Pain Type: Acute pain Pain Location: Wrist Pain Orientation: Left Pain Descriptors / Indicators: Aching;Throbbing Pain Onset: With Activity Pain Intervention(s): Rest;Distraction;Repositioned   Therapy/Group: Individual Therapy  Reginia Naas  [place], [place] 11/19/2020, 12:21 PM

## 2020-11-20 ENCOUNTER — Encounter (HOSPITAL_COMMUNITY): Payer: Self-pay | Admitting: Physical Medicine & Rehabilitation

## 2020-11-20 NOTE — Progress Notes (Signed)
Patient more alert and cooperative, B/P reassessed, and recorded states she was to sleepy earlier nut feel better and more alert now, No acute distress , call bell within reach,

## 2020-11-20 NOTE — Plan of Care (Signed)
  Problem: Consults Goal: RH GENERAL PATIENT EDUCATION Description: See Patient Education module for education specifics. Outcome: Progressing   Problem: RH BOWEL ELIMINATION Goal: RH STG MANAGE BOWEL WITH ASSISTANCE Description: STG Manage Bowel with  mod I Assistance. Outcome: Progressing   Problem: RH SKIN INTEGRITY Goal: RH STG SKIN FREE OF INFECTION/BREAKDOWN Description: Manage skin without new infection or breakdown with min assist Outcome: Progressing Goal: RH STG ABLE TO PERFORM INCISION/WOUND CARE W/ASSISTANCE Description: STG Able To Perform Incision/Wound Care With  min Assistance. Outcome: Progressing   Problem: RH SAFETY Goal: RH STG ADHERE TO SAFETY PRECAUTIONS W/ASSISTANCE/DEVICE Description: STG Adhere to Safety Precautions With cues/reminders Assistance/Device. Outcome: Progressing   Problem: RH PAIN MANAGEMENT Goal: RH STG PAIN MANAGED AT OR BELOW PT'S PAIN GOAL Description: At or below level 4 Outcome: Progressing   Problem: RH KNOWLEDGE DEFICIT GENERAL Goal: RH STG INCREASE KNOWLEDGE OF SELF CARE AFTER HOSPITALIZATION Description: Patient will be able to direct care and dtr provide care for patient independently using handouts and educational resources Outcome: Progressing

## 2020-11-20 NOTE — Progress Notes (Signed)
[name] PHYSICAL MEDICINE & REHABILITATION PROGRESS NOTE  Subjective/Complaints: Patient seen sitting up in bed this morning.  She states she slept well overnight.  She denies complaints.  She was seen by nephrology yesterday, notes reviewed-no changes.  ROS: Denies CP, SOB, N/V/D  Objective: Vital Signs: Blood pressure (!) 141/74, pulse 66, temperature 99.4 F (37.4 C), temperature source Oral, resp. rate 18, weight 72.5 kg, last menstrual period 01/19/1992, SpO2 98 %. No results found. No results for input(s): WBC, HGB, HCT, PLT in the last 72 hours. No results for input(s): NA, K, CL, CO2, GLUCOSE, BUN, CREATININE, CALCIUM in the last 72 hours.  Intake/Output Summary (Last 24 hours) at 11/20/2020 1012 Last data filed at 11/20/2020 0823 Gross per 24 hour  Intake 360 ml  Output 2620 ml  Net -2260 ml        Physical Exam: BP (!) 141/74 (BP Location: Right Arm) Comment: reassessed by RN  Pulse 66   Temp 99.4 F (37.4 C) (Oral)   Resp 18   Wt 72.5 kg   LMP 01/19/1992 (Approximate)   SpO2 98%   BMI 28.31 kg/m  Constitutional: No distress . Vital signs reviewed. HENT: Normocephalic.  Atraumatic. Eyes: EOMI. No discharge. Cardiovascular: No JVD.  RRR. +Murmur.  Respiratory: Normal effort.  No stridor.  Bilateral clear to auscultation. GI: Non-distended.  BS +. Skin: Warm and dry.   Right chest wall catheter CDI Left upper extremity with dressing CDI Psych: Normal mood.  Normal behavior. Musc: Left upper extremity with edema and tenderness, stable Left lower extreme edema, stable Neuro: Alert Motor: RUE: 4-/5 proximal to distal LUE: 3-3+/5 proximal to distal pain inhibition, unchanged B/l LE: HF 3/5, KE, ADF 4/5  Assessment/Plan: 1. Functional deficits which require 3+ hours per day of interdisciplinary therapy in a comprehensive inpatient rehab setting.  Physiatrist is providing close team supervision and 24 hour management of active medical problems listed  below.  Physiatrist and rehab team continue to assess barriers to discharge/monitor patient progress toward functional and medical goals   Care Tool:  Bathing    Body parts bathed by patient: Left arm,Abdomen,Chest,Right upper leg,Left upper leg,Face,Right lower leg   Body parts bathed by helper: Right arm,Left lower leg,Buttocks,Front perineal area     Bathing assist Assist Level: Moderate Assistance - Patient 50 - 74%     Upper Body Dressing/Undressing Upper body dressing   What is the patient wearing?: Pull over shirt    Upper body assist Assist Level: Minimal Assistance - Patient > 75%    Lower Body Dressing/Undressing Lower body dressing      What is the patient wearing?: Pants     Lower body assist Assist for lower body dressing: Maximal Assistance - Patient 25 - 49%     Toileting Toileting    Toileting assist Assist for toileting: Total Assistance - Patient < 25%     Transfers Chair/bed transfer  Transfers assist  Chair/bed transfer activity did not occur:  (sliding board)  Chair/bed transfer assist level: Maximal Assistance - Patient 25 - 49% Chair/bed transfer assistive device: Sliding board   Locomotion Ambulation   Ambulation assist   Ambulation activity did not occur: Safety/medical concerns  Assist level: 2 helpers Assistive device: Walker-rolling Max distance: 8'   Walk 10 feet activity   Assist  Walk 10 feet activity did not occur: Safety/medical concerns        Walk 50 feet activity   Assist Walk 50 feet with 2 turns activity did not  occur: Safety/medical concerns         Walk 150 feet activity   Assist Walk 150 feet activity did not occur: Safety/medical concerns         Walk 10 feet on uneven surface  activity   Assist Walk 10 feet on uneven surfaces activity did not occur: Safety/medical concerns         Wheelchair     Assist Will patient use wheelchair at discharge?: Yes Type of Wheelchair:  Manual    Wheelchair assist level: Minimal Assistance - Patient > 75% Max wheelchair distance: 50    Wheelchair 50 feet with 2 turns activity    Assist        Assist Level: Minimal Assistance - Patient > 75%   Wheelchair 150 feet activity     Assist      Assist Level: Dependent - Patient 0%    Medical Problem List and Plan: 1.  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  Antithrombotics: -DVT/anticoagulation:  Pharmaceutical: Other (comment)--renal dose Eliquis.              -antiplatelet therapy: N/A 3. Pain Management: Oxycodone prn for severe pain. Tramadol added prn for moderate pain.              Added low dose gabapentin for neuropathic pain   Controlled with meds on 6/3  Monitor with increased activity.  4. Mood: LCSW to follow for evaluation and support.              -antipsychotic agents: N/A 5. Neuropsych: This patient is capable of making decisions on his own behalf. 6. Skin/Wound Care:  Routine pressure relief measures.  7. Fluids/Electrolytes/Nutrition: Strict I/Os with daily weights. Renal diet/1200 cc FR.  8. Hematoma LUE s/p evacuation: Continue to monitor pulse and for symptoms. Also elevate LUE 9. Acute on chronic anemia: Improved post transfusion. Continue aransep weekly.   Hb 10.2 on 5/31, labs with HD 10. PAF: Continue amiodarone and Eliquis.   Controlled on 6/3, monitor TID  Monitor with increased exertion 11.  ESRD: HD on TTS after therapy to help with tolerance of therapy.  12. Chronic UTI:   Continue Keflex for suppression 13. B/L foot drop- Prevalon boots for moderate B/L foot drop 14. Leukocytosis  WBCs 7.2 on 5/31, labs with HD, tends to fluctuate  Afebrile  Cont ro monitor 15.  Loose stools  Fiber added on 5/20   Improved 16. Labile blood pressure  Relatively controlled on 6/3  LOS: 17 days A FACE TO FACE  EVALUATION WAS PERFORMED  [name] [name] 11/20/2020, 10:12 AM

## 2020-11-20 NOTE — Progress Notes (Signed)
Physical Therapy Session Note  Patient Details  Name: [name] MRN: [id number] Date of Birth: 01-04-1946  Today's Date: 11/20/2020 PT Individual Time: 0900-1000 PT Individual Time Calculation (min): 60 min   Short Term Goals: Week 2:  PT Short Term Goal 1 (Week 3): Patient to perform w/c mobility 63' with S. PT Short Term Goal 2 (Week 3): Patient to ambulate 54' with LRAD and mod A w/ +2 for w/c follow if needed. PT Short Term Goal 3 (Week 3): Patient to perform slide board transfers with min A consistently PT Short Term Goal 4 (Week 3): Patient to perform sit to stand with mod A from elevated surface.  Skilled Therapeutic Interventions/Progress Updates:    Patient in supine and reports NT in to assist for cleaning.  Reports no schedule available till this morning, so not aware of when to be ready.  Assisted NT to help with perineal hygiene and changing brief.  Placed TED's on total A and pants on with max A.  Patient supine to sit with min to mod A.  Seated to doff shirt and don shirt with S, needed max A for doffing/donnin bra.  Patient transferred to w/c via slide board with min to mod A and cues.  Assisted in w/c to therapy gym and patient sit to stand to EVA walker with mod A.  Ambulated x 7' with mod A and increased time (assist to manage walker on turns).  Patient seated EOB for hip flexion and LAQ while PT obtaining L platform RW for pt.  Patient sit to stand from elevated height with PFRW with mod A standing up to 45 sec with min A to CGA for balance.  Patient ambulated 7' back to w/c with PFRW and mod A but had to bring w/c to pt due to R knee beginning to buckle.  Patient assisted in w/c close to room then propelled x 40' with close S and cues to room.  Left up in chair with all needs in reach and next session imminent.   Therapy Documentation Precautions:  Precautions Precautions: Fall Precaution Comments: L UE fistula with upper arm wound and pain Restrictions Weight Bearing  Restrictions: No Pain: Pain Assessment Pain Scale: 0-10 Pain Score: 0-No pain   Therapy/Group: Individual Therapy  Reginia Naas  Magda Kiel, PT 11/20/2020, 5:33 PM

## 2020-11-20 NOTE — Progress Notes (Signed)
[facility] Progress Note   Subjective:   Completed HD yesterday with net UF 2.6  Seen in room. No complaints.   Objective Vitals:   11/19/20 2020 11/20/20 0600 11/20/20 0601 11/20/20 0643  BP: 112/71  (!) 100/41 (!) 141/74  Pulse: 69  63 66  Resp: 16  18   Temp: (!) 97.4 F (36.3 C)  99.4 F (37.4 C)   TempSrc: Oral  Oral   SpO2: 99%  98%   Weight:  72.5 kg      Additional Objective Labs: Basic Metabolic Panel: Recent Labs  Lab 11/14/20 1440 11/17/20 0445  NA 135 135  K 3.4* 4.1  CL 102 97*  CO2 28 28  GLUCOSE 110* 82  BUN 15 28*  CREATININE 2.89* 4.68*  CALCIUM 7.2* 8.0*  PHOS 1.7* 3.1   CBC: Recent Labs  Lab 11/14/20 1440 11/17/20 0445  WBC 6.2 7.2  NEUTROABS  --  6.2  HGB 9.3* 10.2*  HCT 29.7* 33.1*  MCV 100.3* 101.5*  PLT 144* 209   Blood Culture    Component Value Date/Time   SDES URINE, RANDOM 07/17/2020 1500   SPECREQUEST  07/17/2020 1500    NONE Performed at [facility], [place] [address], [place], [place] [address]    CULT MULTIPLE SPECIES PRESENT, SUGGEST RECOLLECTION (A) 07/17/2020 1500   REPTSTATUS 07/18/2020 FINAL 07/17/2020 1500     Physical Exam General: Well appearing, nad  Heart: RRR systolic murmur  Lungs: clear bilaterally  Abdomen: soft non-tender  Extremities: 1+ LE edema; in compression stockings  Dialysis Access: R IJ TDC;   Medications:  . amiodarone  200 mg Oral Daily  . apixaban  2.5 mg Oral BID  . cephALEXin  250 mg Oral q1800  . Chlorhexidine Gluconate Cloth  6 each Topical Q0600  . darbepoetin (ARANESP) injection - DIALYSIS  100 mcg Intravenous Q Thu-HD  . diclofenac Sodium  2 g Topical QID  . famotidine  20 mg Oral Daily  . feeding supplement (NEPRO CARB STEADY)  237 mL Oral BID BM  . gabapentin  300 mg Oral QHS  . latanoprost  1 drop Both Eyes QHS  . lidocaine-prilocaine  1 application Topical Once per day on Mon Wed Fri  . multivitamin  1 tablet Oral QHS  . polycarbophil  625 mg  Oral BID AC  . saccharomyces boulardii  250 mg Oral BID  . timolol  1 drop Both Eyes Daily    Dialysis Orders:  [facility] TTS  4h 400/50071kg 2/2 bathP2 Hep none RIJ TDC (AVF works butinfiltrated and not using) -Mircera 75 q 2 wks (last 5/5) No VDRA  Assessment/Plan: 1. LUEcompartment syndrome:evacuation of hematoma/ repair of bleeding brachial artery pseudoaneurysm on 5/10 per Dr. Cain/VVS. Pain control per PMD. 2. ESRD - HD TTS. Next HD 6/4. Update labs.  3. Hypertension/volume - Weights up with LE edema.  BP's okay. BPvariable but mostly wellcontrolled. No BP lowering meds at home or here. UF as tolerated to get weights down and  have encouraged fluid restriction.  4. Anemia of CKD/ABLA-Hgb 10.1 On Aranesp 100 mcg q Thursday. Hgb 6.5 at lowest here post-op.s/p2 unit prbcs 8/78. 5. Metabolic bone disease -CCa ok , phosin range w/o binders.No VDRA.Encourage nutrition. 6. Chronic combined CHF -- volume removal with UF on HD  7. PAFib- On Eliquis/amiodarone 8. Nutrition -Renal diet/fluid restriction.Encourage nutrition.Prot supp for low albumin 9. Debility -now in CIR.   Lynnda Child PA-C [facility] 11/20/2020,10:38 AM

## 2020-11-20 NOTE — Progress Notes (Signed)
Occupational Therapy Session Note  Patient Details  Name: [name] MRN: [id number] Date of Birth: 1946-04-10  Today's Date: 11/20/2020 OT Individual Time: 1000-1100 OT Individual Time Calculation (min): 60 min    Short Term Goals: Week 3:  OT Short Term Goal 1 (Week 3): Patient will complete UB bathing/dressing set up A using AE PRN from w/c or EOB. OT Short Term Goal 2 (Week 3): Patient will complete LB bathing mod A using AE PRN. OT Short Term Goal 3 (Week 3): Patient will complete LB dressing mod A using AE PRN. OT Short Term Goal 4 (Week 3): Patient will complete 2/3 toileting tasks mod A using LRAD. OT Short Term Goal 5 (Week 3): Pt will complete toilet transfer mod A consistently using LRAD.  Skilled Therapeutic Interventions/Progress Updates:    Pt received seated in w/c, agreeable to OT, and reporting no pain. Pt requested to work on [place] [facility] tasks. Pt engaged in multiple [facility] and strengthening/conditioning tasks focused on the L hand in unsupported sitting at table (trunk away from back of w/c, no arm rests, and BLEs supported under step). Pt tolerated ~10 minutes of unsupported sitting and dynamic reaching tasks before requesting sitting with back supported; completed ~30 minutes with three rest breaks between. [place] tasks focused on pincer and lateral pincer grasps/strengthening, finger<>palm translation, and in hand rotation of small objects while reaching both L and R outside BOS (promoting lateral and anterior weight shifting in preparation for improved functional mobility). Pt demo'd R leaning with mod tactile and vc's for self-correction. Transfer using Stedy w/c>recliner with mod-max A sit>stand and min A stand>sit with vc's for posture and body mechanics. Pt scooted forward and back in chair with min A using arm rests and vc's for off-weighting hips. Pt remained seated in recliner positioned with pillows on roho cushion, call bell in reach, and all needs met.    Therapy  Documentation Precautions:  Precautions Precautions: Fall Precaution Comments: L UE fistula with upper arm wound and pain Restrictions Weight Bearing Restrictions: No Pain: Pain Assessment Pain Scale: 0-10 Pain Score: 0-No pain   Therapy/Group: Individual Therapy  [name] 11/20/2020, 11:14 AM

## 2020-11-20 NOTE — Progress Notes (Signed)
Occupational Therapy Session Note  Patient Details  Name: [name] MRN: [id number] Date of Birth: 1945/10/23  Today's Date: 11/20/2020 OT Individual Time: 6270-3500 OT Individual Time Calculation (min): 60 min    Short Term Goals: Week 3:  OT Short Term Goal 1 (Week 3): Patient will complete UB bathing/dressing set up A using AE PRN from w/c or EOB. OT Short Term Goal 2 (Week 3): Patient will complete LB bathing mod A using AE PRN. OT Short Term Goal 3 (Week 3): Patient will complete LB dressing mod A using AE PRN. OT Short Term Goal 4 (Week 3): Patient will complete 2/3 toileting tasks mod A using LRAD. OT Short Term Goal 5 (Week 3): Pt will complete toilet transfer mod A consistently using LRAD.  Skilled Therapeutic Interventions/Progress Updates:    Pt received sitting in recliner, agreeable to OT, no pain reported, and requesting to perform activities working on general conditioning and trunk control. Transfer in [place] chair>w/c<>mat>bed; mod A sit<>stand in the stedy with vc's for anterior weight shift and upright posture in stance. Pt self-propelled w/c ~200' with min vc's for elbow extension/longer pushes and to navigate obstacles. Pt engaged in multiple activities targeting stretching (shoulders, trunk, and BUEs), conditioning, trunk control and rotation, and dynamic sitting balance (lateral, anterior, posterior, and low diagonal leaning outside BOS) seated with feet supported on stool on therapy mat. Activities encouraged weight shifting, postural control, and strengthening required in BADLs and functional mobility to improve independence. Pt occasionally required vc's to maintain upright posture with scapular retraction, still often maintaining posterior pelvic tilt. Pt completed slide board transfer with min A posteriorly and vc's for off-weighting hips. Bed mobility requiring mod A sit>sup for BLE management, scooting laterally, and max A +2 to scoot to [facility]. Pt remained supine in  bed, all needs met, alarm set, and call bell in reach.   Therapy Documentation Precautions:  Precautions Precautions: Fall Precaution Comments: L UE fistula with upper arm wound and pain Restrictions Weight Bearing Restrictions: No Pain: Pain Assessment Pain Scale: 0-10 Pain Score: 0-No pain   Therapy/Group: Individual Therapy  [name] 11/20/2020, 2:15 PM

## 2020-11-21 MED ORDER — ACETAMINOPHEN 325 MG PO TABS
ORAL_TABLET | ORAL | Status: AC
  Administered 2020-11-23: 650 mg via ORAL
  Filled 2020-11-21: qty 2

## 2020-11-21 MED ORDER — HEPARIN SODIUM (PORCINE) 1000 UNIT/ML IJ SOLN
INTRAMUSCULAR | Status: AC
  Filled 2020-11-21: qty 3

## 2020-11-21 NOTE — Plan of Care (Signed)
  Problem: Consults Goal: RH GENERAL PATIENT EDUCATION Description: See Patient Education module for education specifics. Outcome: Progressing   Problem: RH BOWEL ELIMINATION Goal: RH STG MANAGE BOWEL WITH ASSISTANCE Description: STG Manage Bowel with  mod I Assistance. Outcome: Progressing   Problem: RH SKIN INTEGRITY Goal: RH STG SKIN FREE OF INFECTION/BREAKDOWN Description: Manage skin without new infection or breakdown with min assist Outcome: Progressing Goal: RH STG ABLE TO PERFORM INCISION/WOUND CARE W/ASSISTANCE Description: STG Able To Perform Incision/Wound Care With  min Assistance. Outcome: Progressing   Problem: RH SAFETY Goal: RH STG ADHERE TO SAFETY PRECAUTIONS W/ASSISTANCE/DEVICE Description: STG Adhere to Safety Precautions With cues/reminders Assistance/Device. Outcome: Progressing   Problem: RH PAIN MANAGEMENT Goal: RH STG PAIN MANAGED AT OR BELOW PT'S PAIN GOAL Description: At or below level 4 Outcome: Progressing   Problem: RH KNOWLEDGE DEFICIT GENERAL Goal: RH STG INCREASE KNOWLEDGE OF SELF CARE AFTER HOSPITALIZATION Description: Patient will be able to direct care and dtr provide care for patient independently using handouts and educational resources Outcome: Progressing

## 2020-11-21 NOTE — Progress Notes (Signed)
[facility] Progress Note   Subjective:   Doing well. Therapy this am. No new complaints.  For dialysis today.   Objective Vitals:   11/20/20 1506 11/20/20 1920 11/21/20 0500 11/21/20 0622  BP: (!) 129/48 (!) 129/45  (!) 112/53  Pulse: 68 70  67  Resp: 18 18  18   Temp: 98.1 F (36.7 C) 98 F (36.7 C)  97.8 F (36.6 C)  TempSrc: Oral Oral    SpO2: 100% 100%  98%  Weight:   74.7 kg     Additional Objective Labs: Basic Metabolic Panel: Recent Labs  Lab 11/14/20 1440 11/17/20 0445  NA 135 135  K 3.4* 4.1  CL 102 97*  CO2 28 28  GLUCOSE 110* 82  BUN 15 28*  CREATININE 2.89* 4.68*  CALCIUM 7.2* 8.0*  PHOS 1.7* 3.1   CBC: Recent Labs  Lab 11/14/20 1440 11/17/20 0445  WBC 6.2 7.2  NEUTROABS  --  6.2  HGB 9.3* 10.2*  HCT 29.7* 33.1*  MCV 100.3* 101.5*  PLT 144* 209   Blood Culture    Component Value Date/Time   SDES URINE, RANDOM 07/17/2020 1500   SPECREQUEST  07/17/2020 1500    NONE Performed at [facility], [place] [address], [place], [place] [address]    CULT MULTIPLE SPECIES PRESENT, SUGGEST RECOLLECTION (A) 07/17/2020 1500   REPTSTATUS 07/18/2020 FINAL 07/17/2020 1500     Physical Exam General: Well appearing, nad  Heart: RRR systolic murmur  Lungs: clear bilaterally  Abdomen: soft non-tender  Extremities: 1+ LE edema; in compression stockings  Dialysis Access: R IJ TDC;   Medications:  . amiodarone  200 mg Oral Daily  . apixaban  2.5 mg Oral BID  . cephALEXin  250 mg Oral q1800  . Chlorhexidine Gluconate Cloth  6 each Topical Q0600  . darbepoetin (ARANESP) injection - DIALYSIS  100 mcg Intravenous Q Thu-HD  . diclofenac Sodium  2 g Topical QID  . famotidine  20 mg Oral Daily  . feeding supplement (NEPRO CARB STEADY)  237 mL Oral BID BM  . gabapentin  300 mg Oral QHS  . latanoprost  1 drop Both Eyes QHS  . lidocaine-prilocaine  1 application Topical Once per day on Mon Wed Fri  . multivitamin  1 tablet Oral QHS  .  polycarbophil  625 mg Oral BID AC  . saccharomyces boulardii  250 mg Oral BID  . timolol  1 drop Both Eyes Daily    Dialysis Orders:  [facility] TTS  4h 400/50071kg 2/2 bathP2 Hep none RIJ TDC (AVF works butinfiltrated and not using) -Mircera 75 q 2 wks (last 5/5) No VDRA  Assessment/Plan: 1. LUEcompartment syndrome:evacuation of hematoma/ repair of bleeding brachial artery pseudoaneurysm on 5/10 per Dr. Cain/VVS. Pain control per PMD. 2. ESRD - HD TTS. Next HD 6/4. Update labs.  3. Hypertension/volume - Weights up with LE edema.  BP's okay. BPvariable but mostly wellcontrolled. No BP lowering meds at home or here. UF as tolerated to get weights down and  have encouraged fluid restriction.  4. Anemia of CKD/ABLA-Hgb 10.1 On Aranesp 100 mcg q Thursday. Hgb 6.5 at lowest here post-op.s/p2 unit prbcs 5/05. 5. Metabolic bone disease -CCa ok , phosin range w/o binders.No VDRA.Encourage nutrition. 6. Chronic combined CHF -- volume removal with UF on HD  7. PAFib- On Eliquis/amiodarone 8. Nutrition -Renal diet/fluid restriction.Encourage nutrition.Prot supp for low albumin 9. Debility -now in CIR.   Lynnda Child PA-C [facility] 11/21/2020,10:57  AM        

## 2020-11-21 NOTE — Progress Notes (Signed)
Occupational Therapy Session Note  Patient Details  Name: [name] MRN: [id number] Date of Birth: 01-14-1946  Today's Date: 11/21/2020 OT Individual Time: 1000-1100 OT Individual Time Calculation (min): 60 min    Short Term Goals: Week 2:  OT Short Term Goal 1 (Week 2): Patient will complete UB bathing/dressing set up A from w/c or EOB. OT Short Term Goal 1 - Progress (Week 2): Progressing toward goal OT Short Term Goal 2 (Week 2): Patient will complete LB bathing mod A using AE PRN. OT Short Term Goal 2 - Progress (Week 2): Progressing toward goal OT Short Term Goal 3 (Week 2): Patient will complete LB dressing max A seated/in stance. OT Short Term Goal 3 - Progress (Week 2): Met OT Short Term Goal 4 (Week 2): Patient will complete toilet transfer using LRAD with mod A. OT Short Term Goal 4 - Progress (Week 2): Not met OT Short Term Goal 5 (Week 2): Patient will complete ADL task using LUE to assist with spvsn and min vc's for initiation. OT Short Term Goal 5 - Progress (Week 2): Met  Skilled Therapeutic Interventions/Progress Updates:    1:1. Pt received in bed agreeable to OT. Session focus on UB dressing, PFRW mobility, sit to stand, donning pants, and functional moiblity with PFRW. Pt completes UB dressing EOB with S and increased time to pull down back. Significantly increased time and coaching on threading strategy provided for LB dressing at EOB with A to pull pants past hips. Pt completes SPT/STS/funcitonal ambulation to/from recliner (1x10' and 1x15') 2x with MOD-MAX +2  A for power up and MOD A for balance with fatigue with PFRW and VC for sequencing walker managmeent in turns. Exited session with pt seated in recliner, exit alarm on and call light in reach   Therapy Documentation Precautions:  Precautions Precautions: Fall Precaution Comments: L UE fistula with upper arm wound and pain Restrictions Weight Bearing Restrictions: No General:   Vital  Signs:  Pain: Pain Assessment Pain Scale: 0-10 Pain Score: 3  Pain Type: Surgical pain Pain Location: Arm Pain Orientation: Proximal Pain Descriptors / Indicators: Discomfort Pain Frequency: Occasional Patients Stated Pain Goal: 0 Pain Intervention(s): Medication (See eMAR) ADL: ADL Eating: Set up Where Assessed-Eating: Wheelchair Grooming: Minimal assistance Where Assessed-Grooming: Wheelchair Upper Body Bathing: Minimal assistance Where Assessed-Upper Body Bathing: Edge of bed Lower Body Bathing: Dependent Where Assessed-Lower Body Bathing: Edge of bed Upper Body Dressing: Minimal assistance Where Assessed-Upper Body Dressing: Edge of bed Lower Body Dressing: Dependent Where Assessed-Lower Body Dressing: Edge of bed Toileting: Not assessed Toilet Transfer Method: Not assessed Tub/Shower Transfer: Not assessed Social research officer, government: Not assessed Vision   Perception    Praxis   Exercises:   Other Treatments:     Therapy/Group: Individual Therapy  Tonny Branch 11/21/2020, 11:03 AM

## 2020-11-22 MED ORDER — WITCH HAZEL-GLYCERIN EX PADS
MEDICATED_PAD | CUTANEOUS | Status: DC | PRN
  Administered 2020-11-23: 1 via TOPICAL
  Filled 2020-11-22: qty 100

## 2020-11-22 NOTE — Progress Notes (Signed)
Occupational Therapy Session Note  Patient Details  Name: [name] MRN: [id number] Date of Birth: May 23, 1946  Today's Date: 11/22/2020 OT Group Time: 1100-1200 OT Group Time Calculation (min): 60 min  Skilled Therapeutic Interventions/Progress Updates:    Pt engaged in therapeutic w/c level dance group focusing on patient choice, UE/LE strengthening, salience, activity tolerance, and social participation. Pt was guided through various dance-based exercises involving UEs/LEs and trunk. All music was selected by group members. Emphasis placed on activity tolerance and general strengthening and endurance. Pt did well participating while seated during group, actively moving her Lt UE in beat to music and bilaterally incorporating affected limb when appropriate. She took several rest breaks and noted decreased amplitude of movements near end of session when reaching point of fatigue. She was escorted back to the room by RT at end of tx.   Therapy Documentation Precautions:  Precautions Precautions: Fall Precaution Comments: L UE fistula with upper arm wound and pain Restrictions Weight Bearing Restrictions: No Pain: no s/s pain during tx Pain Assessment Pain Scale: 0-10 Pain Score: 1  Pain Type: Acute pain Pain Location: Head Pain Orientation: Proximal Pain Descriptors / Indicators: Discomfort Pain Frequency: Occasional Pain Onset: Progressive Patients Stated Pain Goal: 0 Pain Intervention(s): Medication (See eMAR) Multiple Pain Sites: No ADL: ADL Eating: Set up Where Assessed-Eating: Wheelchair Grooming: Minimal assistance Where Assessed-Grooming: Wheelchair Upper Body Bathing: Minimal assistance Where Assessed-Upper Body Bathing: Edge of bed Lower Body Bathing: Dependent Where Assessed-Lower Body Bathing: Edge of bed Upper Body Dressing: Minimal assistance Where Assessed-Upper Body Dressing: Edge of bed Lower Body Dressing: Dependent Where Assessed-Lower Body Dressing:  Edge of bed Toileting: Not assessed Toilet Transfer Method: Not assessed Tub/Shower Transfer: Not assessed Gaffer Transfer: Not assessed      Therapy/Group: Group Therapy  Constantin Hillery A Keatyn Jawad 11/22/2020, 12:41 PM

## 2020-11-22 NOTE — Plan of Care (Signed)
  Problem: Consults Goal: RH GENERAL PATIENT EDUCATION Description: See Patient Education module for education specifics. Outcome: Progressing   Problem: RH BOWEL ELIMINATION Goal: RH STG MANAGE BOWEL WITH ASSISTANCE Description: STG Manage Bowel with  mod I Assistance. Outcome: Progressing   Problem: RH SKIN INTEGRITY Goal: RH STG SKIN FREE OF INFECTION/BREAKDOWN Description: Manage skin without new infection or breakdown with min assist Outcome: Progressing Goal: RH STG ABLE TO PERFORM INCISION/WOUND CARE W/ASSISTANCE Description: STG Able To Perform Incision/Wound Care With  min Assistance. Outcome: Progressing   Problem: RH SAFETY Goal: RH STG ADHERE TO SAFETY PRECAUTIONS W/ASSISTANCE/DEVICE Description: STG Adhere to Safety Precautions With cues/reminders Assistance/Device. Outcome: Progressing   Problem: RH PAIN MANAGEMENT Goal: RH STG PAIN MANAGED AT OR BELOW PT'S PAIN GOAL Description: At or below level 4 Outcome: Progressing   Problem: RH KNOWLEDGE DEFICIT GENERAL Goal: RH STG INCREASE KNOWLEDGE OF SELF CARE AFTER HOSPITALIZATION Description: Patient will be able to direct care and dtr provide care for patient independently using handouts and educational resources Outcome: Progressing

## 2020-11-22 NOTE — Progress Notes (Signed)
[facility] PHYSICAL MEDICINE & REHABILITATION PROGRESS NOTE  Subjective/Complaints: No complaints Mild headache this morning Eating lunch SBP elevated, DBP soft  ROS: Denies CP, SOB, N/V/D  Objective: Vital Signs: Blood pressure (!) 146/57, pulse 68, temperature 99.5 F (37.5 C), temperature source Oral, resp. rate 18, weight 76.7 kg, last menstrual period 01/19/1992, SpO2 97 %. No results found. No results for input(s): WBC, HGB, HCT, PLT in the last 72 hours. No results for input(s): NA, K, CL, CO2, GLUCOSE, BUN, CREATININE, CALCIUM in the last 72 hours.  Intake/Output Summary (Last 24 hours) at 11/22/2020 1506 Last data filed at 11/21/2020 1900 Gross per 24 hour  Intake --  Output 2500 ml  Net -2500 ml        Physical Exam: BP (!) 146/57 (BP Location: Right Arm)   Pulse 68   Temp 99.5 F (37.5 C) (Oral)   Resp 18   Wt 76.7 kg   LMP 01/19/1992 (Approximate)   SpO2 97%   BMI 29.95 kg/m   Gen: no distress, normal appearing HEENT: oral mucosa pink and moist, NCAT Cardio: Reg rate, +Murmur.  Chest: normal effort, normal rate of breathing Abd: soft, non-distended Ext: no edema Psych: pleasant, normal affect Skin: Warm and dry.   Right chest wall catheter CDI Left upper extremity with dressing CDI Psych: Normal mood.  Normal behavior. Musc: Left upper extremity with edema and tenderness, stable Left lower extreme edema, stable Neuro: Alert Motor: RUE: 4-/5 proximal to distal LUE: 3-3+/5 proximal to distal pain inhibition, unchanged B/l LE: HF 3/5, KE, ADF 4/5  Assessment/Plan: 1. Functional deficits which require 3+ hours per day of interdisciplinary therapy in a comprehensive inpatient rehab setting.  Physiatrist is providing close team supervision and 24 hour management of active medical problems listed below.  Physiatrist and rehab team continue to assess barriers to discharge/monitor patient progress toward functional and medical goals   Care  Tool:  Bathing    Body parts bathed by patient: Left arm,Abdomen,Chest,Right upper leg,Left upper leg,Face,Right lower leg   Body parts bathed by helper: Right arm,Left lower leg,Buttocks,Front perineal area     Bathing assist Assist Level: Moderate Assistance - Patient 50 - 74%     Upper Body Dressing/Undressing Upper body dressing   What is the patient wearing?: Pull over shirt    Upper body assist Assist Level: Minimal Assistance - Patient > 75%    Lower Body Dressing/Undressing Lower body dressing      What is the patient wearing?: Pants     Lower body assist Assist for lower body dressing: Maximal Assistance - Patient 25 - 49%     Toileting Toileting    Toileting assist Assist for toileting: Total Assistance - Patient < 25%     Transfers Chair/bed transfer  Transfers assist  Chair/bed transfer activity did not occur:  (sliding board)  Chair/bed transfer assist level: Minimal Assistance - Patient > 75% Chair/bed transfer assistive device: Sliding board   Locomotion Ambulation   Ambulation assist   Ambulation activity did not occur: Safety/medical concerns  Assist level: 2 helpers Assistive device: Walker-rolling Max distance: 8'   Walk 10 feet activity   Assist  Walk 10 feet activity did not occur: Safety/medical concerns        Walk 50 feet activity   Assist Walk 50 feet with 2 turns activity did not occur: Safety/medical concerns         Walk 150 feet activity   Assist Walk 150 feet activity did not occur:  Safety/medical concerns         Walk 10 feet on uneven surface  activity   Assist Walk 10 feet on uneven surfaces activity did not occur: Safety/medical concerns         Wheelchair     Assist Will patient use wheelchair at discharge?: Yes Type of Wheelchair: Manual    Wheelchair assist level: Minimal Assistance - Patient > 75% Max wheelchair distance: 50    Wheelchair 50 feet with 2 turns  activity    Assist        Assist Level: Minimal Assistance - Patient > 75%   Wheelchair 150 feet activity     Assist      Assist Level: Dependent - Patient 0%    Medical Problem List and Plan: 1.  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  Antithrombotics: -DVT/anticoagulation:  Pharmaceutical: Other (comment)--renal dose Eliquis.              -antiplatelet therapy: N/A 3. Pain Management: Continue Oxycodone prn for severe pain. Tramadol added prn for moderate pain.              Added low dose gabapentin for neuropathic pain   Controlled with meds on 6/5  Monitor with increased activity.  4. Mood: LCSW to follow for evaluation and support.              -antipsychotic agents: N/A 5. Neuropsych: This patient is capable of making decisions on his own behalf. 6. Skin/Wound Care:  Routine pressure relief measures.  7. Fluids/Electrolytes/Nutrition: Strict I/Os with daily weights. Renal diet/1200 cc FR.  8. Hematoma LUE s/p evacuation: Continue to monitor pulse and for symptoms. Also elevate LUE 9. Acute on chronic anemia: Improved post transfusion. Continue aransep weekly.   Hb 10.2 on 5/31, labs with HD 10. PAF: Continue amiodarone and Eliquis.   Controlled on 6/3, monitor TID  Monitor with increased exertion 11.  ESRD: HD on TTS after therapy to help with tolerance of therapy.  12. Chronic UTI:   Continue Keflex for suppression 13. B/L foot drop- Prevalon boots for moderate B/L foot drop 14. Leukocytosis  WBCs 7.2 on 5/31, labs with HD, tends to fluctuate  Afebrile  Cont ro monitor 15.  Loose stools  Fiber added on 5/20   Improved 16. Labile blood pressure  Relatively controlled on 6/5, continue to monitor  LOS: 19 days A FACE TO FACE EVALUATION WAS PERFORMED  Gelsey Amyx P Erna Brossard 11/22/2020, 3:06 PM

## 2020-11-22 NOTE — Progress Notes (Signed)
  [facility] Progress Note   Subjective:   Has a HA this am, but otherwise no complaints. Tolerated 2.5L UF on dialysis yesterday    Objective Vitals:   11/21/20 1830 11/21/20 1845 11/22/20 0348 11/22/20 0500  BP: (!) 137/56 (!) 133/59 (!) 132/49   Pulse: 68 68 70   Resp: (!) 21 19 18    Temp:  97.6 F (36.4 C) 98.1 F (36.7 C)   TempSrc:  Oral    SpO2:  100% 97%   Weight:    76.7 kg    Additional Objective Labs: Basic Metabolic Panel: Recent Labs  Lab 11/17/20 0445  NA 135  K 4.1  CL 97*  CO2 28  GLUCOSE 82  BUN 28*  CREATININE 4.68*  CALCIUM 8.0*  PHOS 3.1   CBC: Recent Labs  Lab 11/17/20 0445  WBC 7.2  NEUTROABS 6.2  HGB 10.2*  HCT 33.1*  MCV 101.5*  PLT 209   Blood Culture    Component Value Date/Time   SDES URINE, RANDOM 07/17/2020 1500   SPECREQUEST  07/17/2020 1500    NONE Performed at [facility], [place] [address] [place], [place] [address]    CULT MULTIPLE SPECIES PRESENT, SUGGEST RECOLLECTION (A) 07/17/2020 1500   REPTSTATUS 07/18/2020 FINAL 07/17/2020 1500     Physical Exam General: Well appearing, nad  Heart: RRR systolic murmur  Lungs: clear bilaterally  Abdomen: soft non-tender  Extremities: 1+ LE edema; in compression stockings  Dialysis Access: R IJ TDC;   Medications:  . amiodarone  200 mg Oral Daily  . apixaban  2.5 mg Oral BID  . cephALEXin  250 mg Oral q1800  . Chlorhexidine Gluconate Cloth  6 each Topical Q0600  . darbepoetin (ARANESP) injection - DIALYSIS  100 mcg Intravenous Q Thu-HD  . diclofenac Sodium  2 g Topical QID  . famotidine  20 mg Oral Daily  . feeding supplement (NEPRO CARB STEADY)  237 mL Oral BID BM  . gabapentin  300 mg Oral QHS  . latanoprost  1 drop Both Eyes QHS  . lidocaine-prilocaine  1 application Topical Once per day on Mon Wed Fri  . multivitamin  1 tablet Oral QHS  . polycarbophil  625 mg Oral BID AC  . saccharomyces boulardii  250 mg Oral BID  . timolol  1 drop  Both Eyes Daily    Dialysis Orders:  [facility] TTS  4h 400/50071kg 2/2 bathP2 Hep none RIJ TDC (AVF works butinfiltrated and not using) -Mircera 75 q 2 wks (last 5/5) No VDRA  Assessment/Plan: 1. LUEcompartment syndrome:evacuation of hematoma/ repair of bleeding brachial artery pseudoaneurysm on 5/10 per Dr. Cain/VVS. Pain control per PMD. 2. ESRD - HD TTS. Next HD 6/7. Update labs in the am  3. Hypertension/volume - Weights up with LE edema.  BP's okay. BPvariable but mostly wellcontrolled. No BP lowering meds at home or here. UF as tolerated to get weights down and  have encouraged fluid restriction.  4. Anemia of CKD/ABLA-Hgb 10.1 On Aranesp 100 mcg q Thursday. Hgb 6.5 at lowest here post-op.s/p2 unit prbcs 6/43. 5. Metabolic bone disease -CCa ok , phosin range w/o binders.No VDRA.Encourage nutrition. 6. Chronic combined CHF -- volume removal with UF on HD  7. PAFib- On Eliquis/amiodarone 8. Nutrition -Renal diet/fluid restriction.Encourage nutrition.Prot supp for low albumin 9. Debility -now in CIR.   Lynnda Child PA-C [facility] 11/22/2020,10:58 AM

## 2020-11-23 DIAGNOSIS — R5381 Other malaise: Principal | ICD-10-CM

## 2020-11-23 DIAGNOSIS — K641 Second degree hemorrhoids: Secondary | ICD-10-CM

## 2020-11-23 LAB — RENAL FUNCTION PANEL
Albumin: 1.8 g/dL — ABNORMAL LOW (ref 3.5–5.0)
Anion gap: 10 (ref 5–15)
BUN: 22 mg/dL (ref 8–23)
CO2: 26 mmol/L (ref 22–32)
Calcium: 8.3 mg/dL — ABNORMAL LOW (ref 8.9–10.3)
Chloride: 100 mmol/L (ref 98–111)
Creatinine, Ser: 4.5 mg/dL — ABNORMAL HIGH (ref 0.44–1.00)
GFR, Estimated: 10 mL/min — ABNORMAL LOW (ref >60)
Glucose, Bld: 92 mg/dL (ref 70–99)
Phosphorus: 2.9 mg/dL (ref 2.5–4.6)
Potassium: 3.4 mmol/L — ABNORMAL LOW (ref 3.5–5.1)
Sodium: 136 mmol/L (ref 135–145)

## 2020-11-23 LAB — CBC
HCT: 39.9 % (ref 36.0–46.0)
Hemoglobin: 12.4 g/dL (ref 12.0–15.0)
MCH: 31.3 pg (ref 26.0–34.0)
MCHC: 31.1 g/dL (ref 30.0–36.0)
MCV: 100.8 fL — ABNORMAL HIGH (ref 80.0–100.0)
Platelets: 233 10*3/uL (ref 150–400)
RBC: 3.96 MIL/uL (ref 3.87–5.11)
RDW: 19.9 % — ABNORMAL HIGH (ref 11.5–15.5)
WBC: 9.2 10*3/uL (ref 4.0–10.5)
nRBC: 0 % (ref 0.0–0.2)

## 2020-11-23 MED ORDER — HYDROCORTISONE (PERIANAL) 2.5 % EX CREA
TOPICAL_CREAM | Freq: Two times a day (BID) | CUTANEOUS | Status: DC
  Filled 2020-11-23: qty 28.35

## 2020-11-23 MED ORDER — HYDROCORTISONE ACETATE 25 MG RE SUPP
25.0000 mg | Freq: Two times a day (BID) | RECTAL | Status: DC
  Administered 2020-11-23 – 2020-11-27 (×6): 25 mg via RECTAL
  Filled 2020-11-23 (×19): qty 1

## 2020-11-23 NOTE — Discharge Instructions (Addendum)
        Inpatient Rehab Discharge Instructions  [name] Discharge date and time: 12/02/20   Activities/Precautions/ Functional Status: Activity: activity as tolerated Diet: renal diet Wound Care: keep wound clean and dry   Functional status:  ___ No restrictions     ___ Walk up steps independently ___ 24/7 supervision/assistance   ___ Walk up steps with assistance ___ Intermittent supervision/assistance  ___ Bathe/dress independently ___ Walk with walker     ___ Bathe/dress with assistance ___ Walk Independently    ___ Shower independently ___ Walk with assistance    ___ Shower with assistance ___ No alcohol     ___ Return to work/school ________  Special Instructions:    My questions have been answered and I understand these instructions. I will adhere to these goals and the provided educational materials after my discharge from the hospital.  Patient/Caregiver Signature _______________________________ Date __________  Clinician Signature _______________________________________ Date __________  Please bring this form and your medication list with you to all your follow-up doctor's appointments.  COMMUNITY REFERRALS UPON DISCHARGE:    Home Health:   PT, OT, RN, Secor  Medical Equipment/Items Ordered                                                 Agency/Supplier:

## 2020-11-23 NOTE — Progress Notes (Addendum)
Patient refusing Prevalon boots x 2 days, made aware or purpose states she doesn't want them.Call bell with in reach,    [phone] Patient had a moderate semi loose stool, incontinent of urine,noted rectal area swollen with multiple areas external hemmoroids and bleeding to site, vaginal labial is also swollen, redness and induration around inner area. Incontinent care provided and applied Tuck pads. Repositioned for comfort, prn Tylenol administered

## 2020-11-23 NOTE — Progress Notes (Signed)
Physical Therapy Session Note  Patient Details  Name: [name] MRN: [id number] Date of Birth: [birth year]  Today's Date: 11/23/2020 PT Individual Time: 0900-0945 PT Individual Time Calculation (min): 45 min   Short Term Goals: Week 1:  PT Short Term Goal 1 (Week 1): Patient will perform supine to sit with min A and mod cues. PT Short Term Goal 1 - Progress (Week 1): Progressing toward goal PT Short Term Goal 2 (Week 1): Patient will demonstrate sitting balance with S during simple functional task. PT Short Term Goal 2 - Progress (Week 1): Met PT Short Term Goal 3 (Week 1): Patient will perform sit to stand with mod A at least 50% of attempts. PT Short Term Goal 3 - Progress (Week 1): Progressing toward goal PT Short Term Goal 4 (Week 1): Patient will initiate ambulation. PT Short Term Goal 4 - Progress (Week 1): Met Week 2:  PT Short Term Goal 1 (Week 2): Patient to perform sit to stand with mod A from elevated height consistently. PT Short Term Goal 1 - Progress (Week 2): Progressing toward goal PT Short Term Goal 2 (Week 2): Patient will perform w/c mobility with S x 60'. PT Short Term Goal 2 - Progress (Week 2): Progressing toward goal PT Short Term Goal 3 (Week 2): Patient wil ambulated with RW and mod A x 15' (+2 for w/c follow if needed) PT Short Term Goal 3 - Progress (Week 2): Progressing toward goal PT Short Term Goal 4 (Week 2): Patient to perform slide board transfers with mod A at least 50% of the time. PT Short Term Goal 4 - Progress (Week 2): Met Week 3:  PT Short Term Goal 1 (Week 3): Patient to perform w/c mobility 45' with S. PT Short Term Goal 2 (Week 3): Patient to ambulate 51' with LRAD and mod A w/ +2 for w/c follow if needed. PT Short Term Goal 3 (Week 3): Patient to perform slide board transfers with min A consistently PT Short Term Goal 4 (Week 3): Patient to perform sit to stand with mod A from elevated surface.  Skilled Therapeutic Interventions/Progress  Updates:    pt received in [facility] and agreeable to therapy but reported being very fatigued. Pt reported having had multiple episodes of diarrhea this AM and post previous therapy session, just felt tired. Pt directed in [name] mobility for 125' poor cadence noted and intermittent min A for path deviation corrections. Pt directed in seated BLE strengthening with 2# marching, LAQ, hip abduction/adduction, ankle pumps 2x20. Pt verbalized feeling very tired and requested to lay down feeling that she was unable to complete session. Pt required x2 instances of mod  A for posterior push to improve sitting posture. Pt directed in 64' WC mobility back to room min A the PT to complete rest of distance. Pt directed in slide board transfer min A to complete to return to bed, mod A for sit>supine and positioning. Pt left in bed, All needs in reach and in good condition. Call light in hand.  And alarm set.   Therapy Documentation Precautions:  Precautions Precautions: Fall Precaution Comments: L UE fistula with upper arm wound and pain Restrictions Weight Bearing Restrictions: No General: PT Amount of Missed Time (min): 15 Minutes PT Missed Treatment Reason: Patient fatigue Vital Signs:  Pain:   Mobility:   Locomotion :    Trunk/Postural Assessment :    Balance:   Exercises:   Other Treatments:      Therapy/Group: Individual  Therapy  Junie Panning 11/23/2020, 12:19 PM

## 2020-11-23 NOTE — Progress Notes (Addendum)
Occupational Therapy Session Note  Patient Details  Name: [name] MRN: [id number] Date of Birth: 1945-11-13  Today's Date: 11/23/2020 OT Individual Time: 0370-4888   &  9169-4503 OT Individual Time Calculation (min): 62 min   &   76 min   Short Term Goals: Week 3:  OT Short Term Goal 1 (Week 3): Patient will complete UB bathing/dressing set up A using AE PRN from w/c or EOB. OT Short Term Goal 2 (Week 3): Patient will complete LB bathing mod A using AE PRN. OT Short Term Goal 3 (Week 3): Patient will complete LB dressing mod A using AE PRN. OT Short Term Goal 4 (Week 3): Patient will complete 2/3 toileting tasks mod A using LRAD. OT Short Term Goal 5 (Week 3): Pt will complete toilet transfer mod A consistently using LRAD.  Skilled Therapeutic Interventions/Progress Updates:    AM session:   Patient in bed, alert and ready for therapy session.  Dependent for donning teds, incontinent of bowel x2 at bed level requiring max A for hygiene and donning incontinence brief (second was large/loose) note significant skin irritation - MD aware.  She is able to roll with min A in both directions.  Max A to donn pants bed level.  Side lying to sitting edge of bed with mod A.  Good seated balance.  Sit to stand in stedy with min/mod A.  Utilized stedy to w/c surface.  Oral care mod I w/c level, OH shirt CS, min cues w/c level.   She is able to propel w/c short distances.  Completed upper body, trunk and posture activities.  She remained seated in w/c at close of session - call bell in hand awaiting upcoming PT session.     PM session:   Patient in bed, alert and ready for afternoon session.  Supine to sitting edge of bed with mod A.  Sit to stand in stedy min A.  stedy to w/c.  SB transfer to mat table with mod A.  She tolerated unsupported sitting for 50+ minutes while completing trunk mobility, core strengthening, reaching, upper body conditioning, FMC/dexterity activities with good tolerance.   Grip  and pinch strength:    Grip R = 30#, L = 10#,  pinches:  2 pt R = 4#, L = 1#, 3 pt R = 6#, L = 1#, lat R = 7#, L = 2#  Completed sit to stand from mat surface min A x5 with short weight shift in stance prior to sitting.  SB back to w/c with min A.  Sit to stand in stedy from w/c surface max A.  Returned to bed via stedy.  Sit to supine max A.  She is able to reposition in bed with min A.  She remained in bed for close of session, bed alarm set and call bell in hand.       Therapy Documentation Precautions:  Precautions Precautions: Fall Precaution Comments: L UE fistula with upper arm wound and pain Restrictions Weight Bearing Restrictions: No   Therapy/Group: Individual Therapy  Carlos Levering 11/23/2020, 7:33 AM

## 2020-11-23 NOTE — Progress Notes (Signed)
Patient is in good spirits today, states she has had a fairly good day, States that she continue to have pain and discomfort to surgical   LUE prn does relieve pain and discomfort ,tenderness remains to movement and touch with drainage, steri strips in place, left wrist pulse weak.Assisted with repositioning  Patient anticipated Dialysis

## 2020-11-23 NOTE — Progress Notes (Addendum)
[facility] Progress Note   Subjective: Seen in room. No C/Os. Looks much better. HD tomorrow on schedule.   Objective Vitals:   11/22/20 0500 11/22/20 1248 11/22/20 1953 11/23/20 0545  BP:  (!) 146/57 (!) 143/64 (!) 141/51  Pulse:  68 73 67  Resp:  18 16 16   Temp:  99.5 F (37.5 C) 98.2 F (36.8 C) 98 F (36.7 C)  TempSrc:  Oral Oral Oral  SpO2:  97% 97% 100%  Weight: 76.7 kg   73.2 kg   Physical Exam General: pleasant elderly female in NAD Heart: T0,P5 3/6 systolic M No R/G Lungs: CTAB Abdomen: Active BS Extremities: Trace BLE edema.  Dialysis Access: RIJ TDC Drsg intact.    Additional Objective Labs: Basic Metabolic Panel: Recent Labs  Lab 11/17/20 0445 11/23/20 0724  NA 135 136  K 4.1 3.4*  CL 97* 100  CO2 28 26  GLUCOSE 82 92  BUN 28* 22  CREATININE 4.68* 4.50*  CALCIUM 8.0* 8.3*  PHOS 3.1 2.9   Liver Function Tests: Recent Labs  Lab 11/17/20 0445 11/23/20 0724  ALBUMIN 1.6* 1.8*   No results for input(s): LIPASE, AMYLASE in the last 168 hours. CBC: Recent Labs  Lab 11/17/20 0445 11/23/20 0724  WBC 7.2 9.2  NEUTROABS 6.2  --   HGB 10.2* 12.4  HCT 33.1* 39.9  MCV 101.5* 100.8*  PLT 209 233   Blood Culture    Component Value Date/Time   SDES URINE, RANDOM 07/17/2020 1500   SPECREQUEST  07/17/2020 1500    NONE Performed at [facility], [place] [address], [place], [place] [address]    CULT MULTIPLE SPECIES PRESENT, SUGGEST RECOLLECTION (A) 07/17/2020 1500   REPTSTATUS 07/18/2020 FINAL 07/17/2020 1500    Cardiac Enzymes: No results for input(s): CKTOTAL, CKMB, CKMBINDEX, TROPONINI in the last 168 hours. CBG: No results for input(s): GLUCAP in the last 168 hours. Iron Studies: No results for input(s): IRON, TIBC, TRANSFERRIN, FERRITIN in the last 72 hours. @lablastinr3 @ Studies/Results: No results found. Medications:  . amiodarone  200 mg Oral Daily  . apixaban  2.5 mg Oral BID  . cephALEXin  250 mg Oral  q1800  . Chlorhexidine Gluconate Cloth  6 each Topical Q0600  . darbepoetin (ARANESP) injection - DIALYSIS  100 mcg Intravenous Q Thu-HD  . diclofenac Sodium  2 g Topical QID  . famotidine  20 mg Oral Daily  . feeding supplement (NEPRO CARB STEADY)  237 mL Oral BID BM  . gabapentin  300 mg Oral QHS  . hydrocortisone   Rectal BID  . hydrocortisone  25 mg Rectal BID  . latanoprost  1 drop Both Eyes QHS  . lidocaine-prilocaine  1 application Topical Once per day on Mon Wed Fri  . multivitamin  1 tablet Oral QHS  . polycarbophil  625 mg Oral BID AC  . saccharomyces boulardii  250 mg Oral BID  . timolol  1 drop Both Eyes Daily     Dialysis Orders:  [facility] TTS  4h 400/50071kg 2/2 bathP2 Hep none RIJ TDC (AVF works butinfiltrated and not using) -Mircera 75 q 2 wks (last 5/5) No VDRA  Assessment/Plan: 1. LUEcompartment syndrome:evacuation of hematoma/ repair of bleeding brachial artery pseudoaneurysm on 5/10 per Dr. Cain/VVS. Pain control per PMD. 2. ESRD - HD TTS. Next HD 6/7.  3. Hypertension/volume -Weights up with LE edema.  BP's okay.BPvariable but mostly wellcontrolled. No BP lowering medsat home or here. UF as tolerated to get weights down  and have encouraged fluid restriction. 4. Anemia of CKD/ABLA-Hgb 12.4 Hold Aranesp.Hgb 6.5 at lowest here post-op.s/p2 unit prbcs 9/32. 5. Metabolic bone disease -CCa ok , phosin range w/o binders.No VDRA.Encourage nutrition. 6. Chronic combined CHF -- volume removal with UF on HD  7. PAFib- On Eliquis/amiodarone 8. Nutrition -Renal diet/fluid restriction.Encourage nutrition.Prot supp for low albumin 9. Debility -now in CIR.  [name] NP-C 11/23/2020, 12:10 PM  [facility] [phone]  Nephrology attending: Patient was seen and examined.  Chart reviewed.  I agree with assessment plan as outlined above. 75 year old female ESRD on HD admitted with left upper extremity compartment syndrome  status post evacuation of hematoma and repair of bleeding blood vessel.  Seen by vascular surgeon.  Plan for regular dialysis tomorrow.  Clinically stable.   [name], MD [facility].

## 2020-11-23 NOTE — Progress Notes (Signed)
[place] PHYSICAL MEDICINE & REHABILITATION PROGRESS NOTE  Subjective/Complaints:  Never worn Prevalon boots- but explained she needs to, it will help reduce formation of severe foot drop and reduce risk of pressure ulcers- she says "i'm using end of bed" to keep feet up- Explained that puts her at risk of ulcers.   Also c/o hemorrhoids- painful and bleeding. ,   ROS:  Pt denies SOB, abd pain, CP, N/V/C/D, and vision changes   Objective: Vital Signs: Blood pressure (!) 141/51, pulse 67, temperature 98 F (36.7 C), temperature source Oral, resp. rate 16, weight 73.2 kg, last menstrual period 01/19/1992, SpO2 100 %. No results found. Recent Labs    11/23/20 0724  WBC 9.2  HGB 12.4  HCT 39.9  PLT 233   Recent Labs    11/23/20 0724  NA 136  K 3.4*  CL 100  CO2 26  GLUCOSE 92  BUN 22  CREATININE 4.50*  CALCIUM 8.3*    Intake/Output Summary (Last 24 hours) at 11/23/2020 1002 Last data filed at 11/23/2020 0440 Gross per 24 hour  Intake 480 ml  Output 1 ml  Net 479 ml        Physical Exam: BP (!) 141/51 (BP Location: Right Arm)   Pulse 67   Temp 98 F (36.7 C) (Oral)   Resp 16   Wt 73.2 kg   LMP 01/19/1992 (Approximate)   SpO2 100%   BMI 28.59 kg/m    General: awake, alert, appropriate, sitting up in bed; OT in room;  NAD HENT: conjugate gaze; oropharynx moist CV: regular rate; no JVD Pulmonary: CTA B/L; no W/R/R- good air movement GI: soft, NT, ND, (+)BS Psychiatric: appropriate- still delayed responses Neurological: more alert, interactive Skin: Warm and dry.  R upper arm has staples still in place-  Right chest wall catheter CDI Left upper extremity with dressing CDI but falling down somewhat Musc: Left upper extremity with edema and tenderness, stable Left lower extreme edema, stable Neuro: Alert Motor: RUE: 4-/5 proximal to distal LUE: 3-3+/5 proximal to distal pain inhibition, unchanged B/l LE: HF 3/5, KE, ADF 4/5  Assessment/Plan: 1.  Functional deficits which require 3+ hours per day of interdisciplinary therapy in a comprehensive inpatient rehab setting.  Physiatrist is providing close team supervision and 24 hour management of active medical problems listed below.  Physiatrist and rehab team continue to assess barriers to discharge/monitor patient progress toward functional and medical goals   Care Tool:  Bathing    Body parts bathed by patient: Left arm,Abdomen,Chest,Right upper leg,Left upper leg,Face,Right lower leg   Body parts bathed by helper: Right arm,Left lower leg,Buttocks,Front perineal area     Bathing assist Assist Level: Moderate Assistance - Patient 50 - 74%     Upper Body Dressing/Undressing Upper body dressing   What is the patient wearing?: Pull over shirt    Upper body assist Assist Level: Supervision/Verbal cueing    Lower Body Dressing/Undressing Lower body dressing      What is the patient wearing?: Pants,Incontinence brief     Lower body assist Assist for lower body dressing: Maximal Assistance - Patient 25 - 49%     Toileting Toileting    Toileting assist Assist for toileting: Total Assistance - Patient < 25%     Transfers Chair/bed transfer  Transfers assist  Chair/bed transfer activity did not occur:  (sliding board)  Chair/bed transfer assist level: Minimal Assistance - Patient > 75% Chair/bed transfer assistive device: Sliding board   Locomotion Ambulation  Ambulation assist   Ambulation activity did not occur: Safety/medical concerns  Assist level: 2 helpers Assistive device: Walker-rolling Max distance: 8'   Walk 10 feet activity   Assist  Walk 10 feet activity did not occur: Safety/medical concerns        Walk 50 feet activity   Assist Walk 50 feet with 2 turns activity did not occur: Safety/medical concerns         Walk 150 feet activity   Assist Walk 150 feet activity did not occur: Safety/medical concerns         Walk 10  feet on uneven surface  activity   Assist Walk 10 feet on uneven surfaces activity did not occur: Safety/medical concerns         Wheelchair     Assist Will patient use wheelchair at discharge?: Yes Type of Wheelchair: Manual    Wheelchair assist level: Minimal Assistance - Patient > 75% Max wheelchair distance: 50    Wheelchair 50 feet with 2 turns activity    Assist        Assist Level: Minimal Assistance - Patient > 75%   Wheelchair 150 feet activity     Assist      Assist Level: Dependent - Patient 0%    Medical Problem List and Plan: 1.  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  Antithrombotics: -DVT/anticoagulation:  Pharmaceutical: Other (comment)--renal dose Eliquis.              -antiplatelet therapy: N/A 3. Pain Management: Continue Oxycodone prn for severe pain. Tramadol added prn for moderate pain.              Added low dose gabapentin for neuropathic pain   6/6- pt reports pain controlled- con't regimen  Monitor with increased activity.  4. Mood: LCSW to follow for evaluation and support.              -antipsychotic agents: N/A 5. Neuropsych: This patient is capable of making decisions on his own behalf. 6. Skin/Wound Care:  Routine pressure relief measures.  7. Fluids/Electrolytes/Nutrition: Strict I/Os with daily weights. Renal diet/1200 cc FR.  8. Hematoma LUE s/p evacuation: Continue to monitor pulse and for symptoms. Also elevate LUE 9. Acute on chronic anemia: Improved post transfusion. Continue aransep weekly.   Hb 10.2 on 5/31, labs with HD 10. PAF: Continue amiodarone and Eliquis.   Controlled on 6/3, monitor TID  Monitor with increased exertion 11.  ESRD: HD on TTS after therapy to help with tolerance of therapy.  12. Chronic UTI:   Continue Keflex for suppression 13. B/L foot drop- Prevalon  boots for moderate B/L foot drop  6/6- hasn't been wearing- haven't even been opened- explained she really needs to wear at night so she might be able to walk again. And to prevent pressure ulcers.  14. Leukocytosis  WBCs 7.2 on 5/31, labs with HD, tends to fluctuate  Afebrile  Cont ro monitor 15.  Loose stools  Fiber added on 5/20   Improved 16. Labile blood pressure  Relatively controlled on 6/5, continue to monitor 17. Hemorrhoids.   6/6- will add rectal cream and suppository BID- already has tuck's pad.  18. S/p thrombectomy of LUE  6/6- has staples still in place- will write to remove.   LOS: 20 days A FACE TO FACE EVALUATION WAS PERFORMED  Krue Peterka 11/23/2020, 10:02 AM

## 2020-11-24 LAB — CBC
HCT: 33 % — ABNORMAL LOW (ref 36.0–46.0)
Hemoglobin: 10.2 g/dL — ABNORMAL LOW (ref 12.0–15.0)
MCH: 31.4 pg (ref 26.0–34.0)
MCHC: 30.9 g/dL (ref 30.0–36.0)
MCV: 101.5 fL — ABNORMAL HIGH (ref 80.0–100.0)
Platelets: 252 10*3/uL (ref 150–400)
RBC: 3.25 MIL/uL — ABNORMAL LOW (ref 3.87–5.11)
RDW: 19.8 % — ABNORMAL HIGH (ref 11.5–15.5)
WBC: 8.8 10*3/uL (ref 4.0–10.5)
nRBC: 0 % (ref 0.0–0.2)

## 2020-11-24 LAB — RENAL FUNCTION PANEL
Albumin: 1.6 g/dL — ABNORMAL LOW (ref 3.5–5.0)
Anion gap: 9 (ref 5–15)
BUN: 37 mg/dL — ABNORMAL HIGH (ref 8–23)
CO2: 26 mmol/L (ref 22–32)
Calcium: 8 mg/dL — ABNORMAL LOW (ref 8.9–10.3)
Chloride: 101 mmol/L (ref 98–111)
Creatinine, Ser: 6.01 mg/dL — ABNORMAL HIGH (ref 0.44–1.00)
GFR, Estimated: 7 mL/min — ABNORMAL LOW (ref >60)
Glucose, Bld: 116 mg/dL — ABNORMAL HIGH (ref 70–99)
Phosphorus: 3.4 mg/dL (ref 2.5–4.6)
Potassium: 3.9 mmol/L (ref 3.5–5.1)
Sodium: 136 mmol/L (ref 135–145)

## 2020-11-24 MED ORDER — LIDOCAINE HCL (PF) 1 % IJ SOLN: 5.0000 mL | INTRAMUSCULAR | Status: DC | PRN

## 2020-11-24 MED ORDER — LIDOCAINE-PRILOCAINE 2.5-2.5 % EX CREA: 1.0000 "application " | TOPICAL_CREAM | CUTANEOUS | Status: DC | PRN

## 2020-11-24 MED ORDER — ACETAMINOPHEN 325 MG PO TABS
ORAL_TABLET | ORAL | Status: AC
  Administered 2020-11-24: 650 mg via ORAL
  Filled 2020-11-24: qty 2

## 2020-11-24 MED ORDER — HEPARIN SODIUM (PORCINE) 1000 UNIT/ML DIALYSIS: 1000.0000 [IU] | INTRAMUSCULAR | Status: DC | PRN

## 2020-11-24 MED ORDER — SODIUM CHLORIDE 0.9 % IV SOLN: 100.0000 mL | INTRAVENOUS | Status: DC | PRN

## 2020-11-24 MED ORDER — ALTEPLASE 2 MG IJ SOLR: 2.0000 mg | Freq: Once | INTRAMUSCULAR | Status: DC | PRN

## 2020-11-24 MED ORDER — HEPARIN SODIUM (PORCINE) 1000 UNIT/ML IJ SOLN
INTRAMUSCULAR | Status: AC
  Administered 2020-11-24: 3200 [IU] via INTRAVENOUS_CENTRAL
  Filled 2020-11-24: qty 4

## 2020-11-24 MED ORDER — PENTAFLUOROPROP-TETRAFLUOROETH EX AERO: 1.0000 "application " | INHALATION_SPRAY | CUTANEOUS | Status: DC | PRN

## 2020-11-24 NOTE — Progress Notes (Signed)
Occupational Therapy Session Note  Patient Details  Name: [name] MRN: [id number] Date of Birth: 1946/03/19  Today's Date: 11/24/2020 OT Individual Time: 0800-0900 OT Individual Time Calculation (min): 60 min    Short Term Goals: Week 3:  OT Short Term Goal 1 (Week 3): Patient will complete UB bathing/dressing set up A using AE PRN from w/c or EOB. OT Short Term Goal 2 (Week 3): Patient will complete LB bathing mod A using AE PRN. OT Short Term Goal 3 (Week 3): Patient will complete LB dressing mod A using AE PRN. OT Short Term Goal 4 (Week 3): Patient will complete 2/3 toileting tasks mod A using LRAD. OT Short Term Goal 5 (Week 3): Pt will complete toilet transfer mod A consistently using LRAD.  Skilled Therapeutic Interventions/Progress Updates:    Pt received supine in bed with RN present; assisted for treatment in sidelying - min A to roll to L using bed rails. Sup>sit min A with HOB elevated and use of bed rails; mod A to scoot to EOB with vc's. Pt completed UB dressing EOB with spvsn doffing and min A to donn for button management. LB dressing mod A using reacher seated and in stance in [place]; vc's for use of AE but pt able to assist pulling pants up over hips while reciprocally holding onto [place]. Total A donning ted hose; max A donning slip on shoes. Transfer in [place] bed>w/c. Slide board transfer w/c<>therapy mat min A-CGA with min vc's to move BLEs. In unsupported sitting, pt engaged in trunk and UE stretches and activities targeting postural control, lateral leans, and scapular retraction. Pt completed 2 standing tolerance trials with platform walker and slight marching in place for ~1 min each before needing seated rest breaks. Sit<>stand min-mod A throughout session. Pt remained in w/c at end of session, call bell in reach, and MD present.   Therapy Documentation Precautions:  Precautions Precautions: Fall Precaution Comments: L UE fistula with upper arm wound and  pain Restrictions Weight Bearing Restrictions: No Pain: Pain Assessment Pain Scale: 0-10 Pain Score: 0-No pain   Therapy/Group: Individual Therapy  [name] 11/24/2020, 12:46 PM

## 2020-11-24 NOTE — Progress Notes (Signed)
Patient ID: [name], female   DOB: 02-19-1946, 75 y.o.   MRN: [id number]  Met with pt and daughter via telephone to discuss progress this week. Both pleased with and have scheduled daughter to come in Monday for education from 10-12. Will obtain equipment and follow up set up from previous surgery.

## 2020-11-24 NOTE — Progress Notes (Addendum)
[facility] Progress Note   Subjective: Seen in room, Daughter at bedside. Says that she may go home 12/02/2020. HD later today.   Objective Vitals:   11/23/20 0545 11/23/20 1548 11/23/20 2029 11/24/20 0450  BP: (!) 141/51 (!) 150/83 (!) 141/51 (!) 128/55  Pulse: 67 70 67 71  Resp: 16 17 16 18   Temp: 98 F (36.7 C) 98.1 F (36.7 C) 97.8 F (36.6 C) 97.6 F (36.4 C)  TempSrc: Oral Oral Oral Oral  SpO2: 100% 99% 98% 99%  Weight: 73.2 kg   73.2 kg   Physical Exam General: pleasant elderly female in NAD Heart: J1,O8 3/6 systolic M No R/G Lungs: CTAB Abdomen: Active BS Extremities: Trace BLE edema.  Dialysis Access: RIJ TDC Drsg intact.    Additional Objective Labs: Basic Metabolic Panel: Recent Labs  Lab 11/23/20 0724  NA 136  K 3.4*  CL 100  CO2 26  GLUCOSE 92  BUN 22  CREATININE 4.50*  CALCIUM 8.3*  PHOS 2.9   Liver Function Tests: Recent Labs  Lab 11/23/20 0724  ALBUMIN 1.8*   No results for input(s): LIPASE, AMYLASE in the last 168 hours. CBC: Recent Labs  Lab 11/23/20 0724  WBC 9.2  HGB 12.4  HCT 39.9  MCV 100.8*  PLT 233   Blood Culture    Component Value Date/Time   SDES URINE, RANDOM 07/17/2020 1500   SPECREQUEST  07/17/2020 1500    NONE Performed at [facility], [place] [address], [place], [place] [address]    CULT MULTIPLE SPECIES PRESENT, SUGGEST RECOLLECTION (A) 07/17/2020 1500   REPTSTATUS 07/18/2020 FINAL 07/17/2020 1500    Cardiac Enzymes: No results for input(s): CKTOTAL, CKMB, CKMBINDEX, TROPONINI in the last 168 hours. CBG: No results for input(s): GLUCAP in the last 168 hours. Iron Studies: No results for input(s): IRON, TIBC, TRANSFERRIN, FERRITIN in the last 72 hours. @lablastinr3 @ Studies/Results: No results found. Medications:  . amiodarone  200 mg Oral Daily  . apixaban  2.5 mg Oral BID  . cephALEXin  250 mg Oral q1800  . Chlorhexidine Gluconate Cloth  6 each Topical Q0600  . diclofenac  Sodium  2 g Topical QID  . famotidine  20 mg Oral Daily  . feeding supplement (NEPRO CARB STEADY)  237 mL Oral BID BM  . gabapentin  300 mg Oral QHS  . hydrocortisone   Rectal BID  . hydrocortisone  25 mg Rectal BID  . latanoprost  1 drop Both Eyes QHS  . lidocaine-prilocaine  1 application Topical Once per day on Mon Wed Fri  . multivitamin  1 tablet Oral QHS  . polycarbophil  625 mg Oral BID AC  . saccharomyces boulardii  250 mg Oral BID  . timolol  1 drop Both Eyes Daily     Dialysis Orders:  [facility] TTS  4h 400/50071kg 2/2 bathP2 Hep none RIJ TDC (AVF works butinfiltrated and not using) -Mircera 75 q 2 wks (last 5/5) No VDRA  Assessment/Plan: 1. LUEcompartment syndrome:evacuation of hematoma/ repair of bleeding brachial artery pseudoaneurysm on 5/10 per Dr. Cain/VVS. Pain control per PMD. 2. ESRD - HD TTS. Next HD 6/7.  3. Hypertension/volume -Weights up with LE edema. BP's okay.BPvariable but mostly wellcontrolled. No BP lowering medsat home or here. UF as tolerated to get weights down and have encouraged fluid restriction. 4. Anemia of CKD/ABLA-Hgb 12.4 Hold Aranesp.Hgb 6.5 at lowest here post-op.s/p2 unit prbcs 6/30. 5. Metabolic bone disease -CCa ok , phosin range w/o binders.No VDRA.Encourage nutrition.  6. Chronic combined CHF -- volume removal with UF on HD  7. PAFib- On Eliquis/amiodarone 8. Nutrition -Renal diet/fluid restriction.Encourage nutrition.Prot supp for low albumin 9. Debility -now in CIR.  [name] NP-C 11/24/2020, 11:04 AM  [facility] [phone]  Nephrology attending: Seen and examined at bedside.  Chart reviewed.  I agree with assessment plan as outlined above. Clinically doing well.  Participating in inpatient rehab.  Her daughter at bedside.  Plan for dialysis later today.  [name], MD [facility].

## 2020-11-24 NOTE — Progress Notes (Signed)
Nutrition Follow-up  DOCUMENTATION CODES:   Not applicable  INTERVENTION:  ContinueNepro Shake poBID, each supplement provides 425 kcal and 19 grams protein  Encourage adequate PO intake.  NUTRITION DIAGNOSIS:   Increased nutrient needs related to chronic illness (ESRD HD) as evidenced by estimated needs; ongoing  GOAL:   Patient will meet greater than or equal to 90% of their needs; progressing  MONITOR:   PO intake,Supplement acceptance,Skin,Weight trends,Labs,I & O's  REASON FOR ASSESSMENT:   Malnutrition Screening Tool    ASSESSMENT:   75 year old female with history of ESRD- HD TTS, HTN, PAF, glaucoma, OA, aortic valve stenosis, initially presents with large intramuscular biceps hematoma with concerns of compartment syndrome and was taken to OR on 10/27/20 for evacuation of hematoma and primary repair of left brachial artery pseudoaneurysm. Therapy ongoing and patient noted to have deficits due to LUE pain as well as  BLE weakness with instability. Pt with functional deficits due to myopathy/B/L foot drop. CIR due to functional decline.  Meal completion has been 60-100% with 100% at lunch today. Pt has been tolerating her PO well. Pt currently has Nepro shake ordered with varied consumption. RD to continue with current orders to aid in caloric and protein needs. Pt undergoing HD today.   Labs and medications reviewed.   Diet Order:   Diet Order            Diet renal with fluid restriction Fluid restriction: 1200 mL Fluid; Room service appropriate? Yes; Fluid consistency: Thin  Diet effective now                 EDUCATION NEEDS:   Not appropriate for education at this time  Skin:  Skin Assessment: Reviewed RN Assessment  Last BM:  6/6  Height:   Ht Readings from Last 1 Encounters:  10/26/20 5\' 3"  (1.6 m)    Weight:   Wt Readings from Last 1 Encounters:  11/24/20 73.3 kg   BMI:  Body mass index is 28.63 kg/m.  Estimated Nutritional Needs:    Kcal:  1850-2050  Protein:  85-100 grams  Fluid:  1.2 L/day  Corrin Parker, MS, RD, LDN RD pager number/after hours weekend pager number on Amion.

## 2020-11-24 NOTE — Progress Notes (Signed)
[facility] PHYSICAL MEDICINE & REHABILITATION PROGRESS NOTE  Subjective/Complaints:  Overall doing well. Happy that staples taken out of left arm. Denies pain. Feels that she's getting stronger. Rectal cream helpful for hemorrhoids  ROS: Patient denies fever, rash, sore throat, blurred vision, nausea, vomiting, diarrhea, cough, shortness of breath or chest pain, joint or back pain, headache, or mood change.    Objective: Vital Signs: Blood pressure (!) 128/55, pulse 71, temperature 97.6 F (36.4 C), temperature source Oral, resp. rate 18, weight 73.2 kg, last menstrual period 01/19/1992, SpO2 99 %. No results found. Recent Labs    11/23/20 0724  WBC 9.2  HGB 12.4  HCT 39.9  PLT 233   Recent Labs    11/23/20 0724  NA 136  K 3.4*  CL 100  CO2 26  GLUCOSE 92  BUN 22  CREATININE 4.50*  CALCIUM 8.3*    Intake/Output Summary (Last 24 hours) at 11/24/2020 1151 Last data filed at 11/23/2020 1800 Gross per 24 hour  Intake 480 ml  Output --  Net 480 ml        Physical Exam: BP (!) 128/55 (BP Location: Right Arm)   Pulse 71   Temp 97.6 F (36.4 C) (Oral)   Resp 18   Wt 73.2 kg   LMP 01/19/1992 (Approximate)   SpO2 99%   BMI 28.59 kg/m    Constitutional: No distress . Vital signs reviewed. HEENT: EOMI, oral membranes moist Neck: supple Cardiovascular: RRR with sys murmur. No JVD    Respiratory/Chest: CTA Bilaterally without wheezes or rales. Normal effort    GI/Abdomen: BS +, non-tender, non-distended Ext: no clubbing, cyanosis, or edema Psych: pleasant and cooperative Skin: Warm and dry.  L upper arm incision CDI  Right chest wall catheter CDI Musc: Left upper extremity w/ sl swelling Left lower extreme edema trace Neuro: Alert Motor: RUE: 4-/5 proximal to distal LUE: 3+/5 proximal to distal--stable B/l LE: HF 3/5, KE, ADF 4/5  Assessment/Plan: 1. Functional deficits which require 3+ hours per day of interdisciplinary therapy in a comprehensive inpatient  rehab setting.  Physiatrist is providing close team supervision and 24 hour management of active medical problems listed below.  Physiatrist and rehab team continue to assess barriers to discharge/monitor patient progress toward functional and medical goals   Care Tool:  Bathing    Body parts bathed by patient: Left arm,Abdomen,Chest,Right upper leg,Left upper leg,Face,Right lower leg   Body parts bathed by helper: Right arm,Left lower leg,Buttocks,Front perineal area     Bathing assist Assist Level: Moderate Assistance - Patient 50 - 74%     Upper Body Dressing/Undressing Upper body dressing   What is the patient wearing?: Pull over shirt    Upper body assist Assist Level: Supervision/Verbal cueing    Lower Body Dressing/Undressing Lower body dressing      What is the patient wearing?: Pants,Incontinence brief     Lower body assist Assist for lower body dressing: Maximal Assistance - Patient 25 - 49%     Toileting Toileting    Toileting assist Assist for toileting: Total Assistance - Patient < 25%     Transfers Chair/bed transfer  Transfers assist  Chair/bed transfer activity did not occur:  (sliding board)  Chair/bed transfer assist level: Minimal Assistance - Patient > 75% Chair/bed transfer assistive device: Sliding board   Locomotion Ambulation   Ambulation assist   Ambulation activity did not occur: Safety/medical concerns  Assist level: 2 helpers Assistive device: Walker-rolling Max distance: 8'   Walk 10  feet activity   Assist  Walk 10 feet activity did not occur: Safety/medical concerns        Walk 50 feet activity   Assist Walk 50 feet with 2 turns activity did not occur: Safety/medical concerns         Walk 150 feet activity   Assist Walk 150 feet activity did not occur: Safety/medical concerns         Walk 10 feet on uneven surface  activity   Assist Walk 10 feet on uneven surfaces activity did not occur:  Safety/medical concerns         Wheelchair     Assist Will patient use wheelchair at discharge?: Yes Type of Wheelchair: Manual    Wheelchair assist level: Minimal Assistance - Patient > 75% Max wheelchair distance: 50    Wheelchair 50 feet with 2 turns activity    Assist        Assist Level: Minimal Assistance - Patient > 75%   Wheelchair 150 feet activity     Assist      Assist Level: Dependent - Patient 0%   BP (!) 128/55 (BP Location: Right Arm)   Pulse 71   Temp 97.6 F (36.4 C) (Oral)   Resp 18   Wt 73.2 kg   LMP 01/19/1992 (Approximate)   SpO2 99%   BMI 28.59 kg/m   Medical Problem List and Plan: 1.  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  Antithrombotics: -DVT/anticoagulation:  Pharmaceutical: Other (comment)--renal dose Eliquis.              -antiplatelet therapy: N/A 3. Pain Management: Continue Oxycodone prn for severe pain. Tramadol added prn for moderate pain.              on low dose gabapentin for neuropathic pain   6/7- pt reports pain controlled- con't regimen   .  4. Mood: LCSW to follow for evaluation and support.              -antipsychotic agents: N/A 5. Neuropsych: This patient is capable of making decisions on his own behalf. 6. Skin/Wound Care:  Routine pressure relief measures.  7. Fluids/Electrolytes/Nutrition: Strict I/Os with daily weights. Renal diet/1200 cc FR.  8. Hematoma LUE s/p evacuation: Continue to monitor pulse and for symptoms. Also elevate LUE 9. Acute on chronic anemia: Improved post transfusion. Continue aransep weekly.   Hb 12.4 6/6,  labs with HD 10. PAF: Continue amiodarone and Eliquis.   Controlled on 6/7, monitor TID    11.  ESRD: HD on TTS after therapy to help with tolerance of therapy.  12. Chronic UTI:    Continue Keflex for suppression 13. B/L foot drop- Prevalon boots for moderate B/L foot drop  6/7 reinforcing use of boots for skin/heel cord stretch 14. Leukocytosis  WBCs 9.2 6/6, labs with HD, tends to fluctuate  Afebrile    15.  Loose stools  Fiber added on 5/20   Improved 16. Labile blood pressure  6/7 controlled 17. Hemorrhoids.   6/6- added rectal cream and suppository BID which helped   -has tucks pad     LOS: 21 days A FACE TO FACE EVALUATION WAS PERFORMED  Meredith Staggers 11/24/2020, 11:51 AM

## 2020-11-24 NOTE — Progress Notes (Signed)
Physical Therapy Session Note  Patient Details  Name: [name] MRN: [id number] Date of Birth: August 15, 1945  Today's Date: 11/24/2020 PT Individual Time: 0917-1030 PT Individual Time Calculation (min): 73 min   Short Term Goals: Week 3:  PT Short Term Goal 1 (Week 3): Patient to perform w/c mobility 66' with S. PT Short Term Goal 2 (Week 3): Patient to ambulate 74' with LRAD and mod A w/ +2 for w/c follow if needed. PT Short Term Goal 3 (Week 3): Patient to perform slide board transfers with min A consistently PT Short Term Goal 4 (Week 3): Patient to perform sit to stand with mod A from elevated surface.  Skilled Therapeutic Interventions/Progress Updates:    Patient in w/c in room following OT.  Patient reports stitches out of L arm yesterday and reports no pain.  Patient assisted in w/c to therapy gym.  Performed sit to stand to [place] with mod A and ambulated x 6' then 5' with min to mod A.  Patient ambulated 62' with PFRW and min to mod A and w/c follow.  Patient performed slide board transfer to car at simulated low sedan height with mod A.  Patient performed slide board transfer to mat with min to mod A and seated for balance/trunk work and ball toss 2 x 10 reps.  Patient transferred back to w/c mod A and assisted in w/c to room.  SBT to bed with mod A.  Sit to supine mod A and scooting up to [facility] with min A and bed in trendelenberg.  Patient performed LE therex including bridging, SAQ and hip abduction x 10.  Patient adjusted in bed for comfort/position.  Left with call bell in reach, bed alarm on and daughter in the room.   Therapy Documentation Precautions:  Precautions Precautions: Fall Precaution Comments: L UE fistula with upper arm wound and pain Restrictions Weight Bearing Restrictions: No Pain: Pain Assessment Pain Scale: 0-10 Pain Score: 0-No pain   Therapy/Group: Individual Therapy  Reginia Naas  Magda Kiel, PT 11/24/2020, 8:19 AM

## 2020-11-24 NOTE — Progress Notes (Signed)
Occupational Therapy Session Note  Patient Details  Name: [name] MRN: [id number] Date of Birth: 08-27-1945  Today's Date: 11/24/2020 OT Individual Time: 1130-1155 OT Individual Time Calculation (min): 25 min    Short Term Goals: Week 3:  OT Short Term Goal 1 (Week 3): Patient will complete UB bathing/dressing set up A using AE PRN from w/c or EOB. OT Short Term Goal 2 (Week 3): Patient will complete LB bathing mod A using AE PRN. OT Short Term Goal 3 (Week 3): Patient will complete LB dressing mod A using AE PRN. OT Short Term Goal 4 (Week 3): Patient will complete 2/3 toileting tasks mod A using LRAD. OT Short Term Goal 5 (Week 3): Pt will complete toilet transfer mod A consistently using LRAD.  Skilled Therapeutic Interventions/Progress Updates:    Pt resting in bed upon arrival. OT intervention with focus on LUE [place] and grasp strengthening. Pt issued yellow theraputty and green foam block. Pt used theraputty for taffy pulls with focus on pincher grasp with Lt thumb and index finger. Pt also rolled putty into ball and rolled out into a snake. Pt was using pink foam block but able to manipulate green foam adequately. Pt remained in bed with RN present.   Therapy Documentation Precautions:  Precautions Precautions: Fall Precaution Comments: L UE fistula with upper arm wound and pain Restrictions Weight Bearing Restrictions: No   Pain:  Pt denies pain this morning   Therapy/Group: Individual Therapy  Leroy Libman 11/24/2020, 12:16 PM

## 2020-11-25 DIAGNOSIS — R200 Anesthesia of skin: Secondary | ICD-10-CM

## 2020-11-25 NOTE — Patient Care Conference (Signed)
Inpatient RehabilitationTeam Conference and Plan of Care Update Date: 11/25/2020   Time: 12:06 PM    Patient Name: [name]      Medical Record Number: [id number]  Date of Birth: [birth year] Sex: Female         Room/Bed: 4W02C/4W02C-01 Payor Info: Payor: [place] / Plan: BCBS MEDICARE / Product Type: *No Product type* /    Admit Date/Time:  11/03/2020  1:38 PM  Primary Diagnosis:  Intensive care (ICU) myopathy  Hospital Problems: Principal Problem:   Intensive care (ICU) myopathy Active Problems:   Debility   ESRD on dialysis ([facility])   Acute on chronic anemia   Post-operative pain   Chronic UTI   Loose stools   Labile blood pressure    Expected Discharge Date: Expected Discharge Date: 12/02/20  Team Members Present: Physician leading conference: Dr. Alger Simons Care Coodinator Present: Dorien Chihuahua, RN, BSN, CRRN;Becky Dupree, LCSW Nurse Present: Dorien Chihuahua, RN PT Present: Magda Kiel, PT OT Present: Elisabeth Most, OT PPS Coordinator present : Ileana Ladd, PT     Current Status/Progress Goal Weekly Team Focus  Bowel/Bladder   Patient is continent and incontient of bladder, and incontient of bowel  Patient will restore normal function of B/B  Assess toileting needs, time toileting Q2 hrs and prn   Swallow/Nutrition/ Hydration             ADL's   mod A bed mobility, min/mod A SB and stedy transfers, UB adl set up/CS, LB adl max A, toileting dep - continues with loose stool (incontinent) and sore buttocks  min a  adl training, transfer training, upright tolerance, left UE AROM/coordination, DME, family education   Mobility   min to mod A transfers w/ slide board, mod A from elevated height sit to stand, gait up to 7' with platform RW and mod A  CGA/min A w/c level  LE strength, endurance, balance/trunk strength, slide board transfers, anterior weight shifts, standing   Communication             Safety/Cognition/ Behavioral Observations             Pain   Continue to c/o aching,throbbing  pain to LUE rating pain 7/10 on pain scale  medicate with prn medication and repostioing  pain <3/10  QS/PRN assessment of pain, reassess effectiveness of medications and treatment   Skin   Surgical incision LUE ( arm) staples removed on 11/23/2020 and intact,no drainage  promote healing and prevention of infection.  QS/PRN assessment     Discharge Planning:  Daughter will need to come in for education prior to DC, due to more care now than she provided at home prior to admission   Team Discussion: Critical illness myopathy with ESRD/HD. Incontinent and working on transfers on/off a commode for toileting. Progress limited by fatigue.  Patient on target to meet rehab goals: Currently <om assost stamdomg . Min - mod assist for slide baord transfers. Requires set up for upper body care and mod - max assist for lower body bathing and dressing.  *See Care Plan and progress notes for long and short-term goals.   Revisions to Treatment Plan:   Teaching Needs: Transfers, toileting, medications, etc.   Current Barriers to Discharge: Decreased caregiver support, Home enviroment access/layout, Incontinence and Hemodialysis  Possible Resolutions to Barriers: Family education with daughter set up for 11/30/20 Recommend w/c with specialty cushion Recommend Platform walker for home     Medical Summary Current Status: improving  pain, still incontinent of bladder and bowel. HD ongoing. staples out of LUE  Barriers to Discharge: Medical stability   Possible Resolutions to Barriers/Weekly Focus: HD after therapy, daily assessment of pt data. pain control   Continued Need for Acute Rehabilitation Level of Care: The patient requires daily medical management by a physician with specialized training in physical medicine and rehabilitation for the following reasons: Direction of a multidisciplinary physical rehabilitation program to maximize functional  independence : Yes Medical management of patient stability for increased activity during participation in an intensive rehabilitation regime.: Yes Analysis of laboratory values and/or radiology reports with any subsequent need for medication adjustment and/or medical intervention. : Yes   I attest that I was present, lead the team conference, and concur with the assessment and plan of the team.   Dorien Chihuahua B 11/25/2020, 2:03 PM

## 2020-11-25 NOTE — Progress Notes (Signed)
Occupational Therapy Weekly Progress Note  Patient Details  Name: [name] MRN: [id number] Date of Birth: 17-Oct-1945  Beginning of progress report period: November 18, 2020 End of progress report period: November 25, 2020  Today's Date: 11/25/2020 OT Individual Time: 1324-4010 OT Individual Time Calculation (min): 63 min    Patient has met 1 of 5 short term goals. Pt is progressing towards 2 of the 4 STGs goals not met due to needing min A intermittently for UB dressing (due to LUE deconditioning/FMC limitations) and needing varying degrees of A for LB bathing depending on positioning. Pt has made much progress this week in mobility and endurance and has been able to stand using platform walker and in [place] for approx. 30 sec-1 min each trial. Slide board transfers have consistently been min A in OT sessions and sit>stand requiring mod A +1.   Patient continues to demonstrate the following deficits: muscle weakness, decreased cardiorespiratoy endurance and decreased sitting balance, decreased standing balance, decreased postural control and decreased balance strategies and therefore will continue to benefit from skilled OT intervention to enhance overall performance with BADL and Reduce care partner burden.  Patient progressing toward long term goals..  Continue plan of care.  OT Short Term Goals Week 3:  OT Short Term Goal 1 (Week 3): Patient will complete UB bathing/dressing set up A using AE PRN from w/c or EOB. OT Short Term Goal 1 - Progress (Week 3): Progressing toward goal OT Short Term Goal 2 (Week 3): Patient will complete LB bathing mod A using AE PRN. OT Short Term Goal 2 - Progress (Week 3): Progressing toward goal OT Short Term Goal 3 (Week 3): Patient will complete LB dressing mod A using AE PRN. OT Short Term Goal 3 - Progress (Week 3): Met OT Short Term Goal 4 (Week 3): Patient will complete 2/3 toileting tasks mod A using LRAD. OT Short Term Goal 4 - Progress (Week 3): Not  met OT Short Term Goal 5 (Week 3): Pt will complete toilet transfer mod A consistently using LRAD. OT Short Term Goal 5 - Progress (Week 3): Not met Week 4:  OT Short Term Goal 1 (Week 4): Pt will complete LB bathing/dressing mod A with AE and LRAD. OT Short Term Goal 2 (Week 4): Patient will complete 2/3 toileting tasks mod A using LRAD/AE PRN. OT Short Term Goal 3 (Week 4): Patient will complete toilet transfer with mod A using LRAD. OT Short Term Goal 4 (Week 4): Patient will complete UB bathing/dressing with set up A from w/c or EOB using AE PRN. OT Short Term Goal 5 (Week 4): Patient will complete dynamic standing ADL task with min A using LRAD.  Skilled Therapeutic Interventions/Progress Updates:    Pt received seated in w/c, agreeable to OT session, and reporting unrated pain throughout session, increasing with activity, in L hand (see below). RN provided pain meds. Session focused on static and dynamic standing tolerance/balance using platform walker, trunk and BUE conditioning, and functional transfer training to improve independence in functional mobility and BADLs. Sit<>stand using platform walker requiring mod A to occasional max A due to fatigue and knee buckling. Pt engaged in visual scanning and cognitive (recall) tasks using BITS while standing in platform walker requiring CGA at times due to balance deficits when reaching outside BOS. 3 standing trials completed for 30 sec-1 min in 2/3 trials; unable during second trial to remain standing due to knee buckling. Pt enjoyed cognitive tasks and encouraged to continue in  sessions/at home as method of distraction and stress management. Slide board transfer w/c<>therapy mat min A with vc's for moving BLEs. Pt engaged in trunk/BUE conditioning activities including modified sit ups using wedge posteriorly, lateral leans, and anterior leans with rolling seat and dycem to maintain grasp; all completed 2 sets of 10 reps each. Provided rest breaks  between activities. Transfer w/c>recliner using stedy with mod-max A sit>stand in first trial due to BLE fatigue. Pt remained in recliner, call bell in reach, and all needs met at end of session.  Therapy Documentation Precautions:  Precautions Precautions: Fall Precaution Comments: L UE fistula with upper arm wound and pain Restrictions Weight Bearing Restrictions: No Pain: Pain Assessment Pain Scale: 0-10 Pain Score:  (unrated) Pain Type: Acute pain Pain Location: Hand Pain Orientation: Left Pain Descriptors / Indicators: Aching;Numbness;Tingling Pain Frequency: Intermittent Pain Onset: On-going Patients Stated Pain Goal: 0 Pain Intervention(s): Repositioned;Rest;Distraction   Therapy/Group: Individual Therapy  [name] 11/25/2020, 11:43 AM

## 2020-11-25 NOTE — Progress Notes (Signed)
Physical Therapy Session Note  Patient Details  Name: [name] MRN: [id number] Date of Birth: 1945/07/16  Today's Date: 11/25/2020 PT Individual Time: 4496-7591 PT Individual Time Calculation (min): 51 min   Short Term Goals: Week 3:  PT Short Term Goal 1 (Week 3): Patient to perform w/c mobility 7' with S. PT Short Term Goal 2 (Week 3): Patient to ambulate 99' with LRAD and mod A w/ +2 for w/c follow if needed. PT Short Term Goal 3 (Week 3): Patient to perform slide board transfers with min A consistently PT Short Term Goal 4 (Week 3): Patient to perform sit to stand with mod A from elevated surface.  Skilled Therapeutic Interventions/Progress Updates:    Patient in recliner and reports did not get to rest since earlier sessions of therapy since decided to sit in recliner for lunch and lunch came super late.  Patient reports will do what she can.  Performed sit to stand to [place] mod A.  Stand step to w/c with min A.  Patient in w/c assisted to ortho gym.  Performed sit to stand mod A and stand pivot min A to sedan height in car simulator mod A to sit on low seat with uncontrolled sitting.  Patient attempted sit to stand to [place] from low sedan height car seat, but unable with mod to max A.  So elevated height for sit to stand with mod A and patient stand step to w/c with PFRW and min A.  Discussed practice for purpose of deciding safest and best way for car transfer, but plans likely to show daughter slide board method.  Patient performed slide board transfer to [facility] with min to mod A.  Seated for trunk extension and flexion from hips to toss bean bags and encourage anterior weight shifts for improved transfers.  Patient performed reaching to L then R for bean bags.  Patient donned gloves with A to clean bean bags for working on L UE grip and functional use.  Patient performed slide board transfer to w/c with mod A.  Assisted in w/c to room.  Patient requesting to return to supine to rest despite  another therapy scheduled today.  Patient performed slide board transfer to bed with mod A and increased time.  Sit to supine with mod A and cues for hand placement.  Patient positioned for comfort and left with call bell and needs in reach and bed alarm active.   Therapy Documentation Precautions:  Precautions Precautions: Fall Precaution Comments: L UE fistula with upper arm wound and pain Restrictions Weight Bearing Restrictions: No General: PT Amount of Missed Time (min): 9 Minutes PT Missed Treatment Reason: Patient fatigue Pain: Pain Assessment Pain Scale: 0-10 Pain Score: 0-No pain   Therapy/Group: Individual Therapy  Reginia Naas  [place], PT 11/25/2020, 2:57 PM

## 2020-11-25 NOTE — Progress Notes (Signed)
Physical Therapy Session Note  Patient Details  Name: MARNA WENIGER MRN: [id number] Date of Birth: July 19, 1945  Today's Date: 11/25/2020 PT Individual Time: 0900-0957 PT Individual Time Calculation (min): 57 min   Short Term Goals: Week 3:  PT Short Term Goal 1 (Week 3): Patient to perform w/c mobility 68' with S. PT Short Term Goal 2 (Week 3): Patient to ambulate 12' with LRAD and mod A w/ +2 for w/c follow if needed. PT Short Term Goal 3 (Week 3): Patient to perform slide board transfers with min A consistently PT Short Term Goal 4 (Week 3): Patient to perform sit to stand with mod A from elevated surface.  Skilled Therapeutic Interventions/Progress Updates:   Pt received supine in bed and agreeable to PT. Supine>sit transfer with mod  assist for time management. SB transfer to [facility] with min assist and moderate cues for head/hips relationship. Sitting in [place], RN administrerred medications.   WC mobility through hall x 191ft with supervision assist-CGA for safety.   Pt transported to rehab gym in [facility]. Gait training with PFRW 2 x 55ft with min assist overall. Min-mod assist for all sit<>stand transfers prior to and following gait training due to poor anterior weight shift. Prolonged rest break between bouts due to fatigue   Reciprocal foot tap on 4 inch step with PFRW and min assist from PT 2 x 6 BLE. Patient returned to room and left sitting in [facility] with call bell in reach and all needs met.           Therapy Documentation Precautions:  Precautions Precautions: Fall Precaution Comments: L UE fistula with upper arm wound and pain Restrictions Weight Bearing Restrictions: No Vital Signs: Therapy Vitals Temp: 98.2 F (36.8 C) Temp Source: Oral Pulse Rate: 72 Resp: 16 BP: (!) 159/57 Patient Position (if appropriate): Lying Oxygen Therapy SpO2: 99 % O2 Device: Room Air Pain: Pain Assessment Pain Scale: 0-10 Pain Score: 7  Pain Type: Acute pain Pain Location: Hand Pain  Orientation: Left Pain Descriptors / Indicators: Aching;Numbness;Tingling Pain Frequency: Intermittent Pain Onset: On-going Patients Stated Pain Goal: 0 Pain Intervention(s): Medication (See eMAR)    Therapy/Group: Individual Therapy  [name] 11/25/2020, 6:15 PM

## 2020-11-25 NOTE — Progress Notes (Signed)
Patient resting at interval throughout shift,has verbalized discomfort  to left hand and arm -constantly while awake states discomfort of  numbness and tingling with aching pain sensation, medicated with Tylenol and Oxycodone po. Patient also states discomfort remains at a level 5/10 " never let up". Monitor and assisted,, radial pulse remains weak.Continue regime

## 2020-11-25 NOTE — Consult Note (Signed)
[facility] [place]  ASSESSMENT / PLAN: 75 y.o. female with with numbness of the first, second, and third digits of the left upper extremity after hematoma evacuation and pseudoaneurysm repair of left brachial artery.  These findings have not changed over the past month.  I counseled the patient that she will probably have numbness in her hand for many weeks to come.  No evidence of steal syndrome; the hand is warm and well-perfused.  Please call for questions.  CHIEF COMPLAINT: Left hand numbness  HISTORY OF PRESENT ILLNESS: [name] is a 75 y.o. female who underwent left upper extremity exploration for brachial artery pseudoaneurysm 10/27/2020 after sustaining a traumatic cannulation of the left upper extremity arteriovenous fistula venous fistula.  She is currently recovering in inpatient rehab.  She reported numbness about the left first, second, third fingers which has persisted since her surgery.  She has no pain in her hand.  The hand is a bit clumsy.  Past Medical History:  Diagnosis Date  . Arthritis of left knee   . Cardiomyopathy ([place])    a. h/o LV dysfunction EF 20-25% in 2013 due to sepsis.>> improved to normal   . Chronic diastolic CHF (congestive heart failure) ([place])    10/ 2013 in setting of septic shock  . Complication of anesthesia    use a little anesthesia , per patient MD states she quit breathing (2016); hard to wake up  . ESRD (end stage renal disease) ([place])    dialysis Tues, Thurs, Sat [address], sees dr deterding   . GERD (gastroesophageal reflux disease)   . Glaucoma    both eyes  . H/O hiatal hernia    a. CT 2017: large gastric hiatal hernia.  Marland Kitchen Headache(784.0)    migraine hx of  . History of blood transfusion 04/13/2015   . History of echocardiogram    a. Echo 6/17: EF 60-65%, normal wall motion, mild MR, atrial septal lipomatous hypertrophy, PASP 34 mmHg, possible trivial free-flowing pericardial effusion along RV free wall  // b. Echo 5/17: Mild LVH, EF 55-60%, normal wall motion, grade 1 diastolic dysfunction, trivial MR, severe LAE, mild RAE, PASP 42 mmHg  . History of kidney stones    10/18/2019: per patient "has a couple currently one in each kidney"  . History of nephrostomy 04/11/2015   currently inplace 04/28/2015  removed now  . History of nuclear stress test    a. Myoview 1/14 - Marked ischemia in the basal anterior, mid anterior, apical septal and apical inferior regions, EF 63% >> LHC normal   . Hyperlipidemia   . Hypertension    medication removed from regimen due to low blood pressure   . Iron deficiency    hx  . Myocardial infarction [facility]) 2013   10/18/2019: per patient "in 2013)  . Nephrolithiasis 2002, 2006   bilateral  . Normal coronary arteries 2014   a. LHC in 1/14: normal coronary arteries  . PAF (paroxysmal atrial fibrillation) ([name])    a. 10/ 2013  in setting of Septic Shock //  b. recurrent during admit for pneumonia, L effusion >> placed on Amiodarone // Coumadin for anticoagulation  . Pneumonia jan 2018, last tme lungs clear now   dx 10-06-2014 per CXR--  on 10-27-2014 pt states finished antibiotic and denies cough or fever  . Primary localized osteoarthritis of right hip 10/22/2019  . S/P hemodialysis catheter insertion ([place]) 04/11/2015    right anterior chest , only used once   .  Sigmoid diverticulosis   . SOB (shortness of breath) on exertion    10/18/2019: per patient "get short of breath with activity sometimes due to heart valve"  . UTI (urinary tract infection) 05/10/2016    Past Surgical History:  Procedure Laterality Date  . AV FISTULA PLACEMENT Left 06/02/2015   Procedure: BRACHIOCEPHALIC ARTERIOVENOUS (AV) FISTULA CREATION ;  Surgeon: Conrad Terre [place], MD;  Location: [place];  Service: Vascular;  Laterality: Left;  . BASCILIC VEIN TRANSPOSITION Left 07/27/2015   Procedure: FIRST STAGE BASILIC VEIN TRANSPOSITION LEFT UPPER ARM;  Surgeon: Conrad [place], MD;  Location: [place];   Service: Vascular;  Laterality: Left;  . BASCILIC VEIN TRANSPOSITION Left 09/2015   second phase  . BASCILIC VEIN TRANSPOSITION Left 10/12/2015   Procedure: SECOND STAGE BASILIC VEIN TRANSPOSITION LEFT ARM;  Surgeon: Conrad Fritch, MD;  Location: [place];  Service: Vascular;  Laterality: Left;  . BREAST BIOPSY Left 08/23/07   benign fibrocystic with duct ectasia  . CARDIAC CATHETERIZATION  07-11-2012  dr Irish Lack   Abnormal stress test/   normal coronary arteries/  LVEDP  39mmHg  . CARDIOVASCULAR STRESS TEST  06-26-2012  dr Irish Lack   marked ischemia in the basal anterior, mid anterior, apical inferior regions/  normal LVF, ef 63%  . CATARACT EXTRACTION W/ INTRAOCULAR LENS  IMPLANT, BILATERAL    . COLONOSCOPY WITH PROPOFOL N/A 10/17/2016   Procedure: COLONOSCOPY WITH PROPOFOL;  Surgeon: Garlan Fair, MD;  [facility]: [facility];  Service: Endoscopy;  Laterality: N/A;  . CYSTOSCOPY W/ URETERAL STENT PLACEMENT  04/04/2012   Procedure: CYSTOSCOPY WITH RETROGRADE PYELOGRAM/URETERAL STENT PLACEMENT;  Surgeon: Ailene Rud, MD;  Location: [place];  Service: Urology;  Laterality: Left;  . CYSTOSCOPY W/ URETERAL STENT PLACEMENT Bilateral 05/04/2015   Procedure: CYSTOSCOPY WITH BILATERAL RETROGRADE PYELOGRAM/ WITH INTERPRETATION, EXCHANGE OF RIGHT URETERAL STENT REPLACEMENT AND PLACEMENT LEFT URETERAL STENT PLACEMENT EXAMINATION OF VAGINA;  Surgeon: Carolan Clines, MD;  Location: [facility];  Service: Urology;  Laterality: Bilateral;  . CYSTOSCOPY WITH STENT PLACEMENT Right 10/28/2014   Procedure: RIGHT URETERAL STENT PLACEMENT;  Surgeon: Irine Seal, MD;  Location: [facility];  Service: Urology;  Laterality: Right;  . CYSTOSCOPY WITH STENT PLACEMENT Right 02/26/2015   Procedure: CYSTOSCOPY RETROGRADE PYELOGRAM WITH STENT PLACEMENT;  Surgeon: Cleon Gustin, MD;  [facility]: [facility];  Service: Urology;  Laterality: Right;  . CYSTOSCOPY/RETROGRADE/URETEROSCOPY/STONE EXTRACTION WITH BASKET  Right 11/21/2014   Procedure: CYSTOSCOPY/RIGHT RETROGRADE PYELOGRAM/RIGHT URETEROSCOPY/BASKET EXTRACTION/RIGHT PYELOSCOPY/LASER OF STONE/RIGHT DOUBLE J STENT;  Surgeon: Carolan Clines, MD;  Location: [place];  Service: Urology;  Laterality: Right;  . ESOPHAGOGASTRODUODENOSCOPY (EGD) WITH PROPOFOL Left 08/25/2018   Procedure: ESOPHAGOGASTRODUODENOSCOPY (EGD) WITH PROPOFOL;  Surgeon: Ronald Lobo, MD;  Location: [place];  Service: Endoscopy;  Laterality: Left;  . EXTRACORPOREAL SHOCK WAVE LITHOTRIPSY  05-28-2012  &  10-08-2012  . HEMATOMA EVACUATION Left 10/27/2020   Procedure: EVACUATION HEMATOMA, repair of Pseudoaneurysm;  Surgeon: Waynetta Sandy, MD;  Location: [place];  Service: Vascular;  Laterality: Left;  . HIP ARTHROPLASTY Left 06/09/2020   Procedure: ARTHROPLASTY BIPOLAR HIP (HEMIARTHROPLASTY);  Surgeon: Marchia Bond, MD;  Location: [place];  Service: Orthopedics;  Laterality: Left;  . HOLMIUM LASER APPLICATION Right 12/21/4191   Procedure: HOLMIUM LASER APPLICATION;  Surgeon: Carolan Clines, MD;  Location: [facility];  Service: Urology;  Laterality: Right;  . IR GENERIC HISTORICAL  02/01/2016   IR NEPHROSTOMY EXCHANGE RIGHT 02/01/2016 Greggory Keen, MD [facility]  .  IR GENERIC HISTORICAL  02/24/2016   IR PATIENT EVAL TECH 0-60 MINS 02/24/2016 Aletta Edouard, MD [facility]  . KNEE ARTHROSCOPY Left 02-14-2003  . LAPAROSCOPIC CHOLECYSTECTOMY  03-23-2005  . TOTAL ABDOMINAL HYSTERECTOMY W/ BILATERAL SALPINGOOPHORECTOMY  1993   secondary to fibroids  . TOTAL KNEE ARTHROPLASTY Left 10/22/2019  . TOTAL KNEE ARTHROPLASTY Left 10/22/2019   Procedure: TOTAL KNEE ARTHROPLASTY;  Surgeon: Marchia Bond, MD;  Location: [place];  Service: Orthopedics;  Laterality: Left;  . TRANSTHORACIC ECHOCARDIOGRAM  04-09-2012   normal LVF,  ef 60-65%,  mild LAE,  mild TR, trivial MR and PR    Family History  Problem Relation Age of Onset  . Hypertension Mother    . Cancer Mother 61       breast  . Dementia Mother   . Hypertension Brother   . Diabetes Brother   . Heart disease Brother        before age 21  . Cancer Father 20       pancreatic  . Heart failure Paternal Grandmother   . Bladder Cancer Maternal Grandfather     Social History   Socioeconomic History  . Marital status: Widowed    Spouse name: Not on file  . Number of children: 1  . Years of education: Not on file  . Highest education level: Not on file  Occupational History  . Occupation: retired  . Occupation: Herbalist  Tobacco Use  . Smoking status: Never Smoker  . Smokeless tobacco: Never Used  Vaping Use  . Vaping Use: Never used  Substance and Sexual Activity  . Alcohol use: No    Alcohol/week: 0.0 standard drinks  . Drug use: No  . Sexual activity: Not Currently    Birth control/protection: Post-menopausal, Surgical    Comment: widow husband passed 5/05 with lung cancer  Other Topics Concern  . Not on file  Social History Narrative   ** Merged History Encounter **       Social Determinants of Health   Financial Resource Strain: Not on file  Food Insecurity: Not on file  Transportation Needs: Not on file  Physical Activity: Not on file  Stress: Not on file  Social Connections: Not on file  Intimate Partner Violence: Not on file    Allergies  Allergen Reactions  . Astemizole Nausea And Vomiting  . Fluorouracil Rash  . Vicodin [Hydrocodone-Acetaminophen] Nausea And Vomiting  . Chlorhexidine Rash    Sunburn    rash  . Percocet [Oxycodone-Acetaminophen] Nausea And Vomiting    Current Facility-Administered Medications  Medication Dose Route Frequency Provider Last Rate Last Admin  . acetaminophen (TYLENOL) tablet 325-650 mg  325-650 mg Oral Q4H PRN Bary Leriche, PA-C   650 mg at 11/25/20 2048  . aluminum hydroxide (AMPHOJEL/ALTERNAGEL) suspension 10 mL  10 mL Oral Q6H PRN Love, Pamela S, PA-C      . amiodarone (PACERONE) tablet 200 mg   200 mg Oral Daily Love, Pamela S, PA-C   200 mg at 11/25/20 0800  . apixaban (ELIQUIS) tablet 2.5 mg  2.5 mg Oral BID Bary Leriche, PA-C   2.5 mg at 11/25/20 2048  . bisacodyl (DULCOLAX) suppository 10 mg  10 mg Rectal Daily PRN Love, Pamela S, PA-C      . cephALEXin (KEFLEX) capsule 250 mg  250 mg Oral q1800 Bary Leriche, PA-C   250 mg at 11/25/20 1739  . Chlorhexidine Gluconate Cloth 2 % PADS 6 each  6 each Topical Q0600  Penninger, [place], [place]   6 each at 11/25/20 0600  . diclofenac Sodium (VOLTAREN) 1 % topical gel 2 g  2 g Topical QID Love, Pamela S, PA-C   2 g at 11/25/20 1211  . diphenhydrAMINE (BENADRYL) 12.5 MG/5ML elixir 12.5-25 mg  12.5-25 mg Oral Q6H PRN Love, Pamela S, PA-C      . diphenoxylate-atropine (LOMOTIL) 2.5-0.025 MG/5ML liquid 5 mL  5 mL Oral QID PRN Bary Leriche, PA-C   5 mL at 11/19/20 2116  . famotidine (PEPCID) tablet 20 mg  20 mg Oral Daily Love, Pamela S, PA-C   20 mg at 11/25/20 0800  . feeding supplement (NEPRO CARB STEADY) liquid 237 mL  237 mL Oral BID BM Love, Ivan Anchors, PA-C   237 mL at 11/25/20 1051  . gabapentin (NEURONTIN) capsule 300 mg  300 mg Oral QHS Bary Leriche, PA-C   300 mg at 11/25/20 1739  . guaiFENesin-dextromethorphan (ROBITUSSIN DM) 100-10 MG/5ML syrup 5-10 mL  5-10 mL Oral Q6H PRN Bary Leriche, PA-C   10 mL at 11/12/20 1136  . heparin sodium (porcine) injection 3,200 Units  3,200 Units Intracatheter Q dialysis Penninger, [place], [place]   3,200 Units at 11/21/20 1835  . hydrocortisone (ANUSOL-HC) 2.5 % rectal cream   Rectal BID Courtney Heys, MD   Given at 11/25/20 0800  . hydrocortisone (ANUSOL-HC) suppository 25 mg  25 mg Rectal BID Lovorn, Megan, MD   25 mg at 11/25/20 0800  . latanoprost (XALATAN) 0.005 % ophthalmic solution 1 drop  1 drop Both Eyes QHS Bary Leriche, PA-C   1 drop at 11/25/20 2054  . lidocaine-prilocaine (EMLA) cream 1 application  1 application Topical Once per day on Mon Wed Fri Love, Pamela S, [place]   1 application at  41/66/06 0911  . LORazepam (ATIVAN) tablet 0.5 mg  0.5 mg Oral Daily PRN Bary Leriche, PA-C   0.5 mg at 11/15/20 3016  . multivitamin (RENA-VIT) tablet 1 tablet  1 tablet Oral QHS Penninger, [place], [place]   1 tablet at 11/25/20 2048  . ondansetron (ZOFRAN) tablet 4 mg  4 mg Oral Q8H PRN Bary Leriche, PA-C   4 mg at 11/18/20 1030  . oxyCODONE (Oxy IR/ROXICODONE) immediate release tablet 5 mg  5 mg Oral Q4H PRN Bary Leriche, PA-C   5 mg at 11/25/20 1616  . polycarbophil (FIBERCON) tablet 625 mg  625 mg Oral BID AC LoveIvan Anchors, PA-C   625 mg at 11/25/20 1739  . polyethylene glycol (MIRALAX / GLYCOLAX) packet 17 g  17 g Oral Daily PRN Love, Pamela S, PA-C      . prochlorperazine (COMPAZINE) tablet 5-10 mg  5-10 mg Oral Q6H PRN Love, Pamela S, PA-C       Or  . prochlorperazine (COMPAZINE) injection 5-10 mg  5-10 mg Intramuscular Q6H PRN Bary Leriche, PA-C   10 mg at 11/08/20 1733   Or  . prochlorperazine (COMPAZINE) suppository 12.5 mg  12.5 mg Rectal Q6H PRN Love, Pamela S, PA-C      . saccharomyces boulardii (FLORASTOR) capsule 250 mg  250 mg Oral BID Love, Pamela S, PA-C   250 mg at 11/25/20 2102  . simethicone (MYLICON) chewable tablet 80 mg  80 mg Oral QID PRN Love, Pamela S, PA-C      . timolol (TIMOPTIC) 0.5 % ophthalmic solution 1 drop  1 drop Both Eyes Daily Jamse Arn, MD   1 drop at 11/25/20 0800  .  traMADol (ULTRAM) tablet 50 mg  50 mg Oral Q6H PRN Love, Pamela S, PA-C      . traZODone (DESYREL) tablet 25-50 mg  25-50 mg Oral QHS PRN Bary Leriche, PA-C   50 mg at 11/20/20 2131  . witch hazel-glycerin (TUCKS) pad   Topical PRN Raulkar, Clide Deutscher, MD   Given at 11/24/20 2155    REVIEW OF SYSTEMS:  [X]  denotes positive finding, [ ]  denotes negative finding Cardiac  Comments:  Chest pain or chest pressure:    Shortness of breath upon exertion:    Short of breath when lying flat:    Irregular heart rhythm:        Vascular    Pain in calf, thigh, or hip brought on by  ambulation:    Pain in feet at night that wakes you up from your sleep:     Blood clot in your veins:    Leg swelling:         Pulmonary    Oxygen at home:    Productive cough:     Wheezing:         Neurologic    Sudden weakness in arms or legs:     Sudden numbness in arms or legs:     Sudden onset of difficulty speaking or slurred speech:    Temporary loss of vision in one eye:     Problems with dizziness:         Gastrointestinal    Blood in stool:     Vomited blood:         Genitourinary    Burning when urinating:     Blood in urine:        Psychiatric    Major depression:         Hematologic    Bleeding problems:    Problems with blood clotting too easily:        Skin    Rashes or ulcers:        Constitutional    Fever or chills:      PHYSICAL EXAM  Vitals:   11/24/20 2051 11/25/20 0449 11/25/20 1634 11/25/20 1949  BP: (!) 132/47 (!) 143/54 (!) 159/57 (!) 150/60  Pulse: 69 70 72 75  Resp: 17 17 16 17   Temp: 98 F (36.7 C) 98 F (36.7 C) 98.2 F (36.8 C) 98.4 F (36.9 C)  TempSrc:   Oral   SpO2: 98% 99% 99% 98%  Weight:  72.7 kg      Constitutional: elderyl appearing. no distress. Appears well nourished.  Neurologic: CN intact. no focal findings. + sensory loss about LUE digits 1-3. She has some clumsiness with making a fist and OK sign. She has trouble making a "thumbs up." Psychiatric: Mood and affect symmetric and appropriate. Eyes: No icterus. No conjunctival pallor. Ears, nose, throat: mucous membranes moist. Midline trachea.  Cardiac: regular rate and rhythm.  Respiratory: unlabored. Abdominal: soft, non-tender, non-distended.  Peripheral vascular:  Warm and well perfused hand.  Palpable left ulnar pulse.  Left upper extremity fistula with palpable thrill Extremity: No edema. No cyanosis. No pallor.  Skin: No gangrene. No ulceration.  Lymphatic: No Stemmer's sign. No palpable lymphadenopathy.  PERTINENT LABORATORY AND RADIOLOGIC  DATA  Most recent CBC CBC Latest Ref Rng & Units 11/24/2020 11/23/2020 11/17/2020  WBC 4.0 - 10.5 K/uL 8.8 9.2 7.2  Hemoglobin 12.0 - 15.0 g/dL 10.2(L) 12.4 10.2(L)  Hematocrit 36.0 - 46.0 % 33.0(L) 39.9 33.1(L)  Platelets 150 -  400 K/uL 252 233 209     Most recent CMP CMP Latest Ref Rng & Units 11/24/2020 11/23/2020 11/17/2020  Glucose 70 - 99 mg/dL 116(H) 92 82  BUN 8 - 23 mg/dL 37(H) 22 28(H)  Creatinine 0.44 - 1.00 mg/dL 6.01(H) 4.50(H) 4.68(H)  Sodium 135 - 145 mmol/L 136 136 135  Potassium 3.5 - 5.1 mmol/L 3.9 3.4(L) 4.1  Chloride 98 - 111 mmol/L 101 100 97(L)  CO2 22 - 32 mmol/L 26 26 28   Calcium 8.9 - 10.3 mg/dL 8.0(L) 8.3(L) 8.0(L)  Total Protein 6.5 - 8.1 g/dL - - -  Total Bilirubin 0.3 - 1.2 mg/dL - - -  Alkaline Phos 38 - 126 U/L - - -  AST 15 - 41 U/L - - -  ALT 0 - 44 U/L - - -    Renal function Estimated Creatinine Clearance: 7.6 mL/min (A) (by C-G formula based on SCr of 6.01 mg/dL (H)).  Hgb A1c MFr Bld (%)  Date Value  11/13/2014 5.6    LDL Cholesterol  Date Value Ref Range Status  07/04/2015 63 0 - 99 mg/dL Final    Comment:           Total Cholesterol/HDL:CHD Risk Coronary Heart Disease Risk Table                     Men   Women  1/2 Average Risk   3.4   3.3  Average Risk       5.0   4.4  2 X Average Risk   9.6   7.1  3 X Average Risk  23.4   11.0        Use the calculated Patient Ratio above and the CHD Risk Table to determine the patient's CHD Risk.        ATP III CLASSIFICATION (LDL):  <100     mg/dL   Optimal  100-129  mg/dL   Near or Above                    Optimal  130-159  mg/dL   Borderline  160-189  mg/dL   High  >190     mg/dL   Very High Performed at [place]. Stanford Breed, MD [facility] Phone Number: [phone] 11/25/2020 10:38 PM

## 2020-11-25 NOTE — Progress Notes (Signed)
Occupational Therapy Session Note  Patient Details  Name: [name] MRN: [id number] Date of Birth: [birth year]  Today's Date: 11/25/2020 OT Individual Time: 1530-1600 OT Individual Time Calculation (min): 30 min    Short Term Goals: Week 3:  OT Short Term Goal 1 (Week 3): Patient will complete UB bathing/dressing set up A using AE PRN from w/c or EOB. OT Short Term Goal 1 - Progress (Week 3): Progressing toward goal OT Short Term Goal 2 (Week 3): Patient will complete LB bathing mod A using AE PRN. OT Short Term Goal 2 - Progress (Week 3): Progressing toward goal OT Short Term Goal 3 (Week 3): Patient will complete LB dressing mod A using AE PRN. OT Short Term Goal 3 - Progress (Week 3): Met OT Short Term Goal 4 (Week 3): Patient will complete 2/3 toileting tasks mod A using LRAD. OT Short Term Goal 4 - Progress (Week 3): Not met OT Short Term Goal 5 (Week 3): Pt will complete toilet transfer mod A consistently using LRAD. OT Short Term Goal 5 - Progress (Week 3): Not met  Skilled Therapeutic Interventions/Progress Updates:    Pt received in room and consented to OT tx. Pt requested in-bed exercises during this session as pt reports she is fatigued from prior therapy session. Instructed pt in 3# db (holding in both hands) elbow flexion for 2x15. Pt then instructed in orange theraband exercises including tricep extension and chest press for 3x15 with min cuing for proper technique with good carryover. Afterwards, pt instructed in tan theraputty HEP to increase intrinsic hand and finger strength for improved FMC and hand function during ADLs. Pt required multiple rest breaks during all exercises 2/2 fatigue, but was motivated to complete all that she was instructed to do. After tx, pt left semifowler in bed with alarm on and all needs met.   Therapy Documentation Precautions:  Precautions Precautions: Fall Precaution Comments: L UE fistula with upper arm wound and  pain Restrictions Weight Bearing Restrictions: No Vital Signs: Therapy Vitals Temp: 98.2 F (36.8 C) Temp Source: Oral Pulse Rate: 72 Resp: 16 BP: (!) 159/57 Patient Position (if appropriate): Lying Oxygen Therapy SpO2: 99 % O2 Device: Room Air Pain: Pain Assessment Pain Scale: 0-10 Pain Score: 7  Pain Type: Acute pain Pain Location: Hand Pain Orientation: Left Pain Descriptors / Indicators: Aching;Numbness;Tingling Pain Frequency: Intermittent Pain Onset: On-going Patients Stated Pain Goal: 0 Pain Intervention(s): Medication (See eMAR)   Therapy/Group: Individual Therapy  Kroy Sprung 11/25/2020, 4:57 PM

## 2020-11-25 NOTE — Progress Notes (Addendum)
[facility] Progress Note   Subjective: C/O pain L hand, numbness thumb, index finger,middle finger. Having difficulty using her L hand. This apparently has been going on for several days but she says she didn't think she should mention it to renal. Asked to her tell me EVERYTHING that is happening and we will sort it all out.   Still above OP EDW. Did have slight cramping in HD yesterday. Trying to reach OP EDW in HD tomorrow.   Objective Vitals:   11/24/20 1730 11/24/20 1741 11/24/20 2051 11/25/20 0449  BP: (!) 146/62 (!) 153/58 (!) 132/47 (!) 143/54  Pulse: 67 69 69 70  Resp:  15 17 17   Temp:  98.1 F (36.7 C) 98 F (36.7 C) 98 F (36.7 C)  TempSrc:  Oral    SpO2:  98% 98% 99%  Weight:  71.6 kg  72.7 kg   Physical Exam General:pleasant elderly female in NAD OHYWV:P7,T0 3/6 systolic M No R/G Lungs:CTAB Abdomen:Active BS Extremities:Trace BLE edema. Dialysis Access:RIJ TDC Drsg intact.L AVF + T/B    Additional Objective Labs: Basic Metabolic Panel: Recent Labs  Lab 11/23/20 0724 11/24/20 1400  NA 136 136  K 3.4* 3.9  CL 100 101  CO2 26 26  GLUCOSE 92 116*  BUN 22 37*  CREATININE 4.50* 6.01*  CALCIUM 8.3* 8.0*  PHOS 2.9 3.4   Liver Function Tests: Recent Labs  Lab 11/23/20 0724 11/24/20 1400  ALBUMIN 1.8* 1.6*   No results for input(s): LIPASE, AMYLASE in the last 168 hours. CBC: Recent Labs  Lab 11/23/20 0724 11/24/20 1400  WBC 9.2 8.8  HGB 12.4 10.2*  HCT 39.9 33.0*  MCV 100.8* 101.5*  PLT 233 252   Blood Culture    Component Value Date/Time   SDES URINE, RANDOM 07/17/2020 1500   SPECREQUEST  07/17/2020 1500    NONE Performed at [facility], [place] [address] [place], [place] [address]    CULT MULTIPLE SPECIES PRESENT, SUGGEST RECOLLECTION (A) 07/17/2020 1500   REPTSTATUS 07/18/2020 FINAL 07/17/2020 1500    Cardiac Enzymes: No results for input(s): CKTOTAL, CKMB, CKMBINDEX, TROPONINI in the last 168  hours. CBG: No results for input(s): GLUCAP in the last 168 hours. Iron Studies: No results for input(s): IRON, TIBC, TRANSFERRIN, FERRITIN in the last 72 hours. @lablastinr3 @ Studies/Results: No results found. Medications:  . amiodarone  200 mg Oral Daily  . apixaban  2.5 mg Oral BID  . cephALEXin  250 mg Oral q1800  . Chlorhexidine Gluconate Cloth  6 each Topical Q0600  . diclofenac Sodium  2 g Topical QID  . famotidine  20 mg Oral Daily  . feeding supplement (NEPRO CARB STEADY)  237 mL Oral BID BM  . gabapentin  300 mg Oral QHS  . hydrocortisone   Rectal BID  . hydrocortisone  25 mg Rectal BID  . latanoprost  1 drop Both Eyes QHS  . lidocaine-prilocaine  1 application Topical Once per day on Mon Wed Fri  . multivitamin  1 tablet Oral QHS  . polycarbophil  625 mg Oral BID AC  . saccharomyces boulardii  250 mg Oral BID  . timolol  1 drop Both Eyes Daily     Dialysis Orders:  [facility] TTS  4h 400/50071kg 2/2 bathP2 Hep none RIJ TDC (AVF works butinfiltrated and not using) -Mircera 75 q 2 wks (last 5/5) No VDRA  Assessment/Plan: 1. LUEcompartment syndrome:evacuation of hematoma/ repair of bleeding brachial artery pseudoaneurysm on 5/10 per Dr. Felipa Eth. Pain  control per PMD.Now with pain/numbness, difficulty using L hand. Ask VVS to revisit.  2. ESRD - HD TTS. Next HD 6/9.  3. Hypertension/volume -BP controlled. No BP lowering medsat home or here. UF as tolerated to get weights down and have encouraged fluid restriction.HD 06/07 Net UF 1536 ml. Still above OP EDW. Continue lowering volume as tolerated.  4. Anemia of CKD/ABLA-Hgb10.4 Hold Aranesp.Nadir Hgb 6.5 since admission,s/p2 unit prbcs 2/90. 5. Metabolic bone disease -CCa ok , phosin range w/o binders.No VDRA.Encourage nutrition. 6. Chronic combined CHF -- volume removal with UF on HD  7. PAFib- On Eliquis/amiodarone 8. Nutrition -Renal diet/fluid restriction.Encourage nutrition.Prot supp  for low albumin 9. Debility -now in CIR.   [name] NP-C 11/25/2020, 12:47 PM  [facility] [phone]  Nephrology attending: Patient was seen and examined.  Chart reviewed.  I agree with assessment plan as outlined above. ESRD on HD admitted with left upper extremity compartment syndrome status post surgical repair and hematoma evacuation.  Now undergoing inpatient rehab.  She had dialysis yesterday with 1.5 L UF, tolerated well.  Plan for next regular dialysis tomorrow.  [name], MD [facility]

## 2020-11-26 LAB — CBC
HCT: 36.1 % (ref 36.0–46.0)
Hemoglobin: 11.2 g/dL — ABNORMAL LOW (ref 12.0–15.0)
MCH: 31.4 pg (ref 26.0–34.0)
MCHC: 31 g/dL (ref 30.0–36.0)
MCV: 101.1 fL — ABNORMAL HIGH (ref 80.0–100.0)
Platelets: 217 10*3/uL (ref 150–400)
RBC: 3.57 MIL/uL — ABNORMAL LOW (ref 3.87–5.11)
RDW: 19.2 % — ABNORMAL HIGH (ref 11.5–15.5)
WBC: 8.6 10*3/uL (ref 4.0–10.5)
nRBC: 0 % (ref 0.0–0.2)

## 2020-11-26 LAB — RENAL FUNCTION PANEL
Albumin: 1.8 g/dL — ABNORMAL LOW (ref 3.5–5.0)
Anion gap: 8 (ref 5–15)
BUN: 16 mg/dL (ref 8–23)
CO2: 26 mmol/L (ref 22–32)
Calcium: 7.6 mg/dL — ABNORMAL LOW (ref 8.9–10.3)
Chloride: 100 mmol/L (ref 98–111)
Creatinine, Ser: 3.2 mg/dL — ABNORMAL HIGH (ref 0.44–1.00)
GFR, Estimated: 14 mL/min — ABNORMAL LOW (ref >60)
Glucose, Bld: 119 mg/dL — ABNORMAL HIGH (ref 70–99)
Phosphorus: 2 mg/dL — ABNORMAL LOW (ref 2.5–4.6)
Potassium: 3.4 mmol/L — ABNORMAL LOW (ref 3.5–5.1)
Sodium: 134 mmol/L — ABNORMAL LOW (ref 135–145)

## 2020-11-26 MED ORDER — HEPARIN SODIUM (PORCINE) 1000 UNIT/ML IJ SOLN
INTRAMUSCULAR | Status: AC
  Administered 2020-11-26: 3200 [IU]
  Filled 2020-11-26: qty 4

## 2020-11-26 MED ORDER — SODIUM CHLORIDE 0.9 % IV SOLN: 100.0000 mL | INTRAVENOUS | Status: DC | PRN

## 2020-11-26 MED ORDER — HEPARIN SODIUM (PORCINE) 1000 UNIT/ML DIALYSIS: 1000.0000 [IU] | INTRAMUSCULAR | Status: DC | PRN

## 2020-11-26 MED ORDER — ACETAMINOPHEN 325 MG PO TABS
ORAL_TABLET | ORAL | Status: AC
  Administered 2020-11-26: 650 mg via ORAL
  Filled 2020-11-26: qty 2

## 2020-11-26 MED ORDER — ALTEPLASE 2 MG IJ SOLR: 2.0000 mg | Freq: Once | INTRAMUSCULAR | Status: DC | PRN

## 2020-11-26 MED ORDER — LIDOCAINE HCL (PF) 1 % IJ SOLN: 5.0000 mL | INTRAMUSCULAR | Status: DC | PRN

## 2020-11-26 MED ORDER — PENTAFLUOROPROP-TETRAFLUOROETH EX AERO: 1.0000 "application " | INHALATION_SPRAY | CUTANEOUS | Status: DC | PRN

## 2020-11-26 MED ORDER — LIDOCAINE-PRILOCAINE 2.5-2.5 % EX CREA: 1.0000 "application " | TOPICAL_CREAM | CUTANEOUS | Status: DC | PRN

## 2020-11-26 NOTE — Progress Notes (Addendum)
[facility] Progress Note   Subjective: Seen during therapy break. Seen by VVS for numbness L hand 11/25/20. No evidence of steal syndrome. Will have to wait for numbness to improve. No C/Os today. HD later this PM.   Objective Vitals:   11/25/20 0449 11/25/20 1634 11/25/20 1949 11/26/20 0433  BP: (!) 143/54 (!) 159/57 (!) 150/60 (!) 173/58  Pulse: 70 72 75 70  Resp: 17 16 17 17   Temp: 98 F (36.7 C) 98.2 F (36.8 C) 98.4 F (36.9 C) 97.7 F (36.5 C)  TempSrc:  Oral    SpO2: 99% 99% 98% 99%  Weight: 72.7 kg   72 kg   Physical Exam General: pleasant elderly female in NAD Heart: X8,P3 3/6 systolic M No R/G Lungs: CTAB Abdomen: Active BS Extremities: Trace BLE edema.  Dialysis Access: RIJ TDC Drsg intact. L AVF + T/B       Additional Objective Labs: Basic Metabolic Panel: Recent Labs  Lab 11/23/20 0724 11/24/20 1400  NA 136 136  K 3.4* 3.9  CL 100 101  CO2 26 26  GLUCOSE 92 116*  BUN 22 37*  CREATININE 4.50* 6.01*  CALCIUM 8.3* 8.0*  PHOS 2.9 3.4   Liver Function Tests: Recent Labs  Lab 11/23/20 0724 11/24/20 1400  ALBUMIN 1.8* 1.6*   No results for input(s): LIPASE, AMYLASE in the last 168 hours. CBC: Recent Labs  Lab 11/23/20 0724 11/24/20 1400  WBC 9.2 8.8  HGB 12.4 10.2*  HCT 39.9 33.0*  MCV 100.8* 101.5*  PLT 233 252   Blood Culture    Component Value Date/Time   SDES URINE, RANDOM 07/17/2020 1500   SPECREQUEST  07/17/2020 1500    NONE Performed at [facility], [place] [address] [place], [place] [address]    CULT MULTIPLE SPECIES PRESENT, SUGGEST RECOLLECTION (A) 07/17/2020 1500   REPTSTATUS 07/18/2020 FINAL 07/17/2020 1500    Cardiac Enzymes: No results for input(s): CKTOTAL, CKMB, CKMBINDEX, TROPONINI in the last 168 hours. CBG: No results for input(s): GLUCAP in the last 168 hours. Iron Studies: No results for input(s): IRON, TIBC, TRANSFERRIN, FERRITIN in the last 72  hours. @lablastinr3 @ Studies/Results: No results found. Medications:   amiodarone  200 mg Oral Daily   apixaban  2.5 mg Oral BID   cephALEXin  250 mg Oral q1800   Chlorhexidine Gluconate Cloth  6 each Topical Q0600   diclofenac Sodium  2 g Topical QID   famotidine  20 mg Oral Daily   feeding supplement (NEPRO CARB STEADY)  237 mL Oral BID BM   gabapentin  300 mg Oral QHS   hydrocortisone   Rectal BID   hydrocortisone  25 mg Rectal BID   latanoprost  1 drop Both Eyes QHS   lidocaine-prilocaine  1 application Topical Once per day on Mon Wed Fri   multivitamin  1 tablet Oral QHS   polycarbophil  625 mg Oral BID AC   saccharomyces boulardii  250 mg Oral BID   timolol  1 drop Both Eyes Daily     Dialysis Orders: [facility] TTS 4h  400/500 71kg 2/2 bath P2  Hep none RIJ TDC (AVF works but infiltrated and not using) -Mircera 75 q 2 wks (last 5/5) No VDRA    Assessment/Plan: LUE compartment syndrome: evacuation of hematoma/ repair of bleeding brachial artery pseudoaneurysm on 5/10 per Dr. Felipa Eth. Pain control per PMD. Now with pain/numbness, difficulty using L hand. Seen by VVS 11/25/20. No evidence of steal. Will need time  for symptoms to resolve. No intervention needed.  ESRD -  HD TTS. Next HD 6/9. Hypertension/volume  - BP controlled. No BP lowering meds at home or here. UF as tolerated to get weights down and have encouraged fluid restriction. HD 06/07 Net UF 1536 ml. Still above OP EDW. Continue lowering volume as tolerated.  Anemia of CKD/ABLA   - Hgb 10.4 Hold Aranesp. Nadir Hgb 6.5 since admission, s/p 2 unit prbcs 5/11.  Metabolic bone disease -  CCa ok , phos in range w/o binders. No VDRA.  Encourage nutrition.   Chronic combined CHF -- volume removal with UF on HD PAFib- On Eliquis/amiodarone  Nutrition - Renal diet/fluid restriction.  Encourage nutrition. Prot supp for low albumin Debility - now in CIR.    Amy H. Brown NP-C 11/26/2020, 11:02 AM  [facility] [phone]  Nephrology attending: Patient was seen and examined.  Chart reviewed.  I agree with assessment plan as outlined above. ESRD on HD admitted with left upper extremity compartment syndrome status post surgical repair and hematoma evacuation.  Now undergoing inpatient rehab.  The vascular surgeon reevaluated for and numbness without any sign of steal syndrome.  Continue to monitor for now.  Plan for dialysis today as per TTS schedule.  She is clinically stable.  Labs at goal.  [name], MD [facility].

## 2020-11-26 NOTE — Progress Notes (Signed)
Occupational Therapy Session Note  Patient Details  Name: [name] MRN: [id number] Date of Birth: December 27, 1945  Today's Date: 11/26/2020 OT Individual Time: 4709-6283 OT Individual Time Calculation (min): 53 min    Short Term Goals: Week 4:  OT Short Term Goal 1 (Week 4): Pt will complete LB bathing/dressing mod A with AE and LRAD. OT Short Term Goal 2 (Week 4): Patient will complete 2/3 toileting tasks mod A using LRAD/AE PRN. OT Short Term Goal 3 (Week 4): Patient will complete toilet transfer with mod A using LRAD. OT Short Term Goal 4 (Week 4): Patient will complete UB bathing/dressing with set up A from w/c or EOB using AE PRN. OT Short Term Goal 5 (Week 4): Patient will complete dynamic standing ADL task with min A using LRAD.  Skilled Therapeutic Interventions/Progress Updates:     Pt received seated in w/c, initially denies pain + agreeable to therapy. Session focus on AROM + FMC of LUE, standing tolerance, energy conservation education, and BLE strengthening in prep for improved ADL/func mobility performance. Total A w/c transport to and from gym 2/2 time management + energy conservation. Seated at [facility], completed single target game using LUE at 2 min with 91.49% accuracy, memory game with 75% accuracy, and Bell Cancellation Task with 95% accuracy. Demonstrates moderate amount of trunk compensation when reaching about ~100* of shoulder flexion, but otherwise AROM grossly WFL for tasks completed. Attempted to complete STS x3 with use of table then PFRW, req mod A to power up, but pt unable to place RUE onto supporting surface. C/o fatigue and volar LUE pain, declining further attempts. Finally, completed 7 total min on Kinetron at 30 cm/sec wi rest break after 5 min. Pt reporting 7/8 fatigue with activity on mod RPE. Educated on self-monitoring fatigue + routine management for optimal energy conservation. SB transfer back to bed with overall mod A. Returned to supine mod A to progress  BLE onto bed.   Pt left semi-reclined in bed with bed alarm engaged, call bell in reach, and all immediate needs met.   Therapy Documentation Precautions:  Precautions Precautions: Fall Precaution Comments: L UE fistula with upper arm wound and pain Restrictions Weight Bearing Restrictions: No  Pain: see session note ADL: See Care Tool for more details.   Therapy/Group: Individual Therapy  Volanda Napoleon MS, OTR/L   11/26/2020, 6:49 AM

## 2020-11-26 NOTE — Progress Notes (Signed)
Physical Therapy Weekly Progress Note  Patient Details  Name: [name] MRN: [id number] Date of Birth: 1946/04/08  Beginning of progress report period: November 19, 2020 End of progress report period: November 26, 2020  Today's Date: 11/26/2020 PT Individual Time: 1015-1100 PT Individual Time Calculation (min): 45 min   Patient has met 4 of 4 short term goals.  Patient progressing with more consistency with sit to stand transfers at mod A level and slide board transfers with min A level.  She has been able to walk up to 25' with PFRW and min to mod A with w/c following for safety.  She has initiated car transfers this week both with slide board and stand step with RW techniques.   Patient continues to demonstrate the following deficits muscle weakness and decreased standing balance and decreased postural control and therefore will continue to benefit from skilled PT intervention to increase functional independence with mobility.  Patient progressing toward long term goals..  Continue plan of care.  PT Short Term Goals Week 3:  PT Short Term Goal 1 (Week 3): Patient to perform w/c mobility 35' with S. PT Short Term Goal 1 - Progress (Week 3): Met PT Short Term Goal 2 (Week 3): Patient to ambulate 65' with LRAD and mod A w/ +2 for w/c follow if needed. PT Short Term Goal 2 - Progress (Week 3): Met PT Short Term Goal 3 (Week 3): Patient to perform slide board transfers with min A consistently PT Short Term Goal 3 - Progress (Week 3): Met PT Short Term Goal 4 (Week 3): Patient to perform sit to stand with mod A from elevated surface. PT Short Term Goal 4 - Progress (Week 3): Met Week 4:  PT Short Term Goal 1 (Week 4): STG=LTG due to ELOS  Skilled Therapeutic Interventions/Progress Updates:    Patient in w/c in room finishing up with OT.  Patient in w/c assisted to ortho gym.  Sit to stand to PFRW mod A and ambulated 15' with min A.  Sit to supine with mod A for LE's and cues for upper body  management.  Supine for therex consisting of hooklying hip flexionx 10, hooklying hip abduction with yellow t-band x 10.  Rolled to R and side to sit with min A and cues.  Patient performed slide board transfer min A to w/c and then car transfer with slide board with mod A and increased time.  Patient propelled w/c in ortho gym x 50' with S.  Patient assisted to room and left in w/c for next therapy session.  Therapy Documentation Precautions:  Precautions Precautions: Fall Precaution Comments: L UE fistula with upper arm wound and pain Restrictions Weight Bearing Restrictions: No  Pain: Pain Assessment Pain Scale: 0-10 Pain Score: 0-No pain     Therapy/Group: Individual Therapy  Reginia Naas Magda Kiel, PT 11/26/2020, 12:48 PM

## 2020-11-26 NOTE — Progress Notes (Signed)
[facility]ICINE [facility]ABILIT[facility] PROGRESS NOTE  Subjective/Complaints: In pretty good spirits. Feels that she's getting stronger. Looking forward to going home next week!  ROS: Patient denies fever, rash, sore throat, blurred vision, nausea, vomiting, diarrhea, cough, shortness of breath or chest pain, joint or back pain, headache, or mood change.   Objective: Vital Signs: Blood pressure (!) 173/58, pulse 70, temperature 97.7 F (36.5 C), resp. rate 17, weight 72 kg, last menstrual period 01/19/1992, SpO2 99 %. No results found. Recent Labs    11/24/20 1400  WBC 8.8  HGB 10.2*  HCT 33.0*  PLT 252   Recent Labs    11/24/20 1400  NA 136  K 3.9  CL 101  CO2 26  GLUCOSE 116*  BUN 37*  CREATININE 6.01*  CALCIUM 8.0*    Intake/Output Summary (Last 24 hours) at 11/26/2020 1012 Last data filed at 11/26/2020 0921 Gross per 24 hour  Intake 798 ml  Output --  Net 798 ml        Physical Exam: BP (!) 173/58   Pulse 70   Temp 97.7 F (36.5 C)   Resp 17   Wt 72 kg   LMP 01/19/1992 (Approximate)   SpO2 99%   BMI 28.12 kg/m    Constitutional: No distress . Vital signs reviewed. HEENT: EOMI, oral membranes moist Neck: supple Cardiovascular: RRR without murmur. No JVD    Respiratory/Chest: CTA Bilaterally without wheezes or rales. Normal effort    GI/Abdomen: BS +, non-tender, non-distended Ext: no clubbing, cyanosis, or edema Psych: pleasant and cooperative Skin: Warm and dry.  L upper arm incision CDI. Residual bruising decreased Right chest wall catheter CDI Musc: Left upper extremity w/ sl swelling but improved Left lower extreme edema trace Neuro: Alert Motor: RUE: 4-/5 proximal to distal LUE: 3+/5 proximal to distal--stable B/l LE: HF 3/5, KE, ADF 4/5  Assessment/Plan: 1. Functional deficits which require 3+ hours per day of interdisciplinary therapy in a comprehensive inpatient rehab setting. Physiatrist is providing close team supervision and  24 hour management of active medical problems listed below. Physiatrist and rehab team continue to assess barriers to discharge/monitor patient progress toward functional and medical goals   Care Tool:  Bathing    Body parts bathed by patient: Left arm, Abdomen, Chest, Right upper leg, Left upper leg, Face, Right lower leg   Body parts bathed by helper: Right arm, Left lower leg, Buttocks, Front perineal area     Bathing assist Assist Level: Moderate Assistance - Patient 50 - 74%     Upper Body Dressing/Undressing Upper body dressing   What is the patient wearing?: Pull over shirt, Button up shirt    Upper body assist Assist Level: Minimal Assistance - Patient > 75% (spvsn doffing pullover; min A for buttons donning)    Lower Body Dressing/Undressing Lower body dressing      What is the patient wearing?: Pants     Lower body assist Assist for lower body dressing: Moderate Assistance - Patient 50 - 74% (seated and in stance in [employer]; AE used)     Chartered loss adjuster assist Assist for toileting: Total Assistance - Patient < 25%     Transfers Chair/bed transfer  Transfers assist  Chair/bed transfer activity did not occur:  (sliding board)  Chair/bed transfer assist level: Minimal Assistance - Patient > 75% Chair/bed transfer assistive device: Sliding board   Locomotion Ambulation   Ambulation assist   Ambulation activity did not occur: Safety/medical concerns  Assist level: 2 helpers Assistive device: Walker-platform Max distance: 23'   Walk 10 feet activity   Assist  Walk 10 feet activity did not occur: Safety/medical concerns  Assist level: 2 helpers Assistive device: Walker-platform   Walk 50 feet activity   Assist Walk 50 feet with 2 turns activity did not occur: Safety/medical concerns         Walk 150 feet activity   Assist Walk 150 feet activity did not occur: Safety/medical concerns         Walk 10 feet on uneven  surface  activity   Assist Walk 10 feet on uneven surfaces activity did not occur: Safety/medical concerns         Wheelchair     Assist Will patient use wheelchair at discharge?: Yes Type of Wheelchair: Manual    Wheelchair assist level: Minimal Assistance - Patient > 75% Max wheelchair distance: 50    Wheelchair 50 feet with 2 turns activity    Assist        Assist Level: Minimal Assistance - Patient > 75%   Wheelchair 150 feet activity     Assist      Assist Level: Dependent - Patient 0%   BP (!) 173/58   Pulse 70   Temp 97.7 F (36.5 C)   Resp 17   Wt 72 kg   LMP 01/19/1992 (Approximate)   SpO2 99%   BMI 28.12 kg/m   Medical Problem List and Plan: 1.  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  Antithrombotics: -DVT/anticoagulation:  Pharmaceutical: Other (comment)--renal dose Eliquis.              -antiplatelet therapy: N/A 3. Pain Management: Continue Oxycodone prn for severe pain. Tramadol added prn for moderate pain.              on low dose gabapentin for neuropathic pain   6/9- pt reports pain controlled- con't regimen   .  4. Mood: LCSW to follow for evaluation and support.              -antipsychotic agents: N/A 5. Neuropsych: This patient is capable of making decisions on his own behalf. 6. Skin/Wound Care:  Routine pressure relief measures.  7. Fluids/Electrolytes/Nutrition: Strict I/Os with daily weights. Renal diet/1200 cc FR.  8. Hematoma LUE s/p evacuation: Continue to monitor pulse and for symptoms. Also elevate LUE 9. Acute on chronic anemia: Improved post transfusion. Continue aransep weekly.   Hb 12.4 6/6,  labs with HD 10. PAF: Continue amiodarone and Eliquis.   Controlled on 6/9, monitor TID    11.  ESRD: HD on TTS after therapy to help with tolerance of therapy.  12.  Chronic UTI:   Continue Keflex for suppression 13. B/L foot drop- Prevalon boots for moderate B/L foot drop  6/7 reinforcing use of boots for skin/heel cord stretch 14. Leukocytosis  WBCs 10.2 6/7 labs with HD, tends to fluctuate  Afebrile    15.  Loose stools  Fiber added on 5/20   Improved 16. Labile blood pressure  6/9 controlled 17. Hemorrhoids.     rectal cream and suppository BID which have helped   -has tucks pad     LOS: 23 days A FACE TO [place] 11/26/2020, 10:12 AM

## 2020-11-26 NOTE — Progress Notes (Signed)
Occupational Therapy Session Note  Patient Details  Name: [name] MRN: [id number] Date of Birth: February 05, 1946  Today's Date: 11/26/2020 OT Individual Time: 0623-7628 OT Individual Time Calculation (min): 59 min    Short Term Goals: Week 4:  OT Short Term Goal 1 (Week 4): Pt will complete LB bathing/dressing mod A with AE and LRAD. OT Short Term Goal 2 (Week 4): Patient will complete 2/3 toileting tasks mod A using LRAD/AE PRN. OT Short Term Goal 3 (Week 4): Patient will complete toilet transfer with mod A using LRAD. OT Short Term Goal 4 (Week 4): Patient will complete UB bathing/dressing with set up A from w/c or EOB using AE PRN. OT Short Term Goal 5 (Week 4): Patient will complete dynamic standing ADL task with min A using LRAD.  Skilled Therapeutic Interventions/Progress Updates:    Pt received supine in bed being changed with NT following BM; agreeable to OT session. Total A to don ted hose. Pt engaged in bed mobility practice to simulate home environment - sup>sit on L side of bed without use of bed rails requiring increased time and vc's for body mechanics, but pt completed with close spvsn. UB dressing set up A. LB dressing mod-max A seated EOB and in stance to pull over hips with Stedy; vc's and AE use to thread legs while seated with pt demoing good anterior reaching to feet. Max A to don footwear. Pt demo'd increased fatigue following dressing ADLs; transferred bed>w/c using Stedy with mod A sit<>stand and vc's for anterior weight shift with bed slightly elevated. Pt engaged in oral hygiene mod I at sink in w/c. Education and discussion of bathroom set up at home and encouraged pt to complete [facility] transfers for toileting at home due to limitations in home bathroom size and safe navigation using platform walker. Pt completed stand step transfer using platform walker w/c>BSC with mod A for balance deficits. Pt incontinent of bowels and transferred using Stedy BSC>bed for clean up.  Sit>sup mod A for BLE management. Perihygiene and brief donning total A. Pt remained seated in w/c following transfer with [facility] with PT entering for next session, all needs met.   Therapy Documentation Precautions:  Precautions Precautions: Fall Precaution Comments: L UE fistula with upper arm wound and pain Restrictions Weight Bearing Restrictions: No  Pain: Pain Assessment Pain Scale: 0-10 Pain Score: 0-No pain   Therapy/Group: Individual Therapy  [name] 11/26/2020, 12:47 PM

## 2020-11-27 ENCOUNTER — Ambulatory Visit: Payer: Medicare Other | Admitting: Interventional Cardiology

## 2020-11-27 DIAGNOSIS — F320 Major depressive disorder, single episode, mild: Secondary | ICD-10-CM

## 2020-11-27 NOTE — Consult Note (Signed)
Neuropsychological Consultation   Patient:   [name]   DOB:   03/09/1946  MR Number:  [id number]  Location:  [facility] [id number] [place] [address] Dept: [place]: [phone]           Date of Service:    11/27/2020  Start Time:   10 AM End Time:   11 AM  Provider/Observer:  Ilean Skill, Psy.D.       Clinical Neuropsychologist       Billing Code/Service: [phone]  Chief Complaint:    [name] is a 75 year old female with a past medical history of end-stage renal disease on hemodialysis, TTS, hypertension, PAF, glaucoma, osteoarthritis, aortic valve stenosis with DOE, prior CIR stay 2013 for debility post septic shock/encephalopathy.  Patient also had a hip fracture in December 2021 and was admitted via ED on 10/26/2020 with left upper extremity pain, swelling and numbness with reports of having infiltrated her aVF 5/3 but able to complete hemodialysis on 5/7 after brief rest.  Patient was found to have large intramuscular bicep hematoma with concerns for compartment syndrome and was taken to the OR on 5/10 for evacuation of hematoma and primary repair of left brachial artery pseudoaneurysm by Dr. Donzetta Matters.  Hemodialysis ongoing and patient continues to be limited by fatigue, nausea and other GI issues.  Due to deficits of left upper extremity pain as well as bilateral lower extremity weakness it with instability.  Reason for Service:  Patient was referred for neuropsychological consultation due to coping and adjustment with residual effects of long medical difficulties including recurrent infections and recent hip surgery and stay at skilled nursing where she developed C. difficile and became increased in the debilitated.  Below is the HPI for the current admission.  HPI: [name] is a 75 year old female with history of ESRD- HD TTS, HTN, PAF, glaucoma, OA,  Aortic  valve stenosis with DOE, prior CIR stay 2013 for debility post septic shock/encephalopathy, left hip fracture 12/21; who was admitted via ED on 10/26/20 with LUE pain, swelling and numbness with reports of  having infiltrated her AVF 05/03 but able complete HD on 05/07 after brief rest.  She was found to have large intramuscular biceps hematoma with concerns of compartment syndrome and was taken to OR on 10/27/20 for evacuation of hematoma and primary repair of left brachial artery pseudoaneurysm by Dr. Donzetta Matters. Post procedure, numbness almost resolved with +thrill noted in fistula. Acute on chronic anemia treated with two total units of PRBC and weekly aranesp added today.  HD ongoing and she continues to be limited by fatigue, nausea and acute on chronic diarrhea. Therapy ongoing and patient noted to have deficits due to LUE pain as well as  BLE weakness with instability. CIR recommended due to functional decline.   Pt reports has been in and out of hospital since L hip fx in January, where it took 1 month to treat her C Diff and wetn out worse than she "went in". Also had 16 admissions to hospital for UTIs 1 year, so is on Keflex for prophylaxis. Also pain is OK controlled, but bothering her before meds can be given again. LUE numb/tingling and PAINFUL.  Felt like would explode before surgery, now a "dull roar". Hasn't been able to control bowel and bladder for 3+ years- goes, but always has diarrhea and accidents/incontinent bowel AND bladder- voids at least 2-3x/day minimum.  Current Status:  Patient was awake and alert when I entered the room reporting that she was fatigued.  Patient describes increasing fatigue post dialysis and greater difficulty with muscle weakness and instability.  Patient reports that it is been very challenging to her with all of the medical issues she has been developing and then she broke her hip, went to skilled nursing with little therapies provided and developed C. difficile  and later had the issues with her blood vessel.  Patient reports that she is feel like she is getting good work in her therapies and is motivated to increase her strength and reports that her mood has been improving.  Behavioral Observation: [name]  presents as a 75 y.o.-year-old Right Caucasian Female who appeared her stated age. her dress was Appropriate and she was Well Groomed and her manners were Appropriate to the situation.  her participation was indicative of Appropriate behaviors.  There were physical disabilities noted.  she displayed an appropriate level of cooperation and motivation.     Interactions:    Active Redirectable  Attention:   abnormal and attention span appeared shorter than expected for age  Memory:   within normal limits; recent and remote memory intact  Visuo-spatial:  not examined  Speech (Volume):  low  Speech:   normal; normal  Thought Process:  Coherent and Relevant  Though Content:  WNL; not suicidal and not homicidal  Orientation:   person, place, time/date, and situation  Judgment:   Fair  Planning:   Fair  Affect:    Flat and Lethargic  Mood:    Dysphoric  Insight:   Good  Intelligence:   normal  Medical History:   Past Medical History:  Diagnosis Date   Arthritis of left knee    Cardiomyopathy ([place])    a. h/o LV dysfunction EF 20-25% in 2013 due to sepsis.>> improved to normal    Chronic diastolic CHF (congestive heart failure) ([place])    10/ 2013 in setting of septic shock   Complication of anesthesia    use a little anesthesia , per patient MD states she quit breathing (2016); hard to wake up   ESRD (end stage renal disease) ([place])    dialysis Tues, Thurs, Sat [address], sees dr deterding    GERD (gastroesophageal reflux disease)    Glaucoma    both eyes   H/O hiatal hernia    a. CT 2017: large gastric hiatal hernia.   Headache(784.0)    migraine hx of   History of blood transfusion 04/13/2015    History of  echocardiogram    a. Echo 6/17: EF 60-65%, normal wall motion, mild MR, atrial septal lipomatous hypertrophy, PASP 34 mmHg, possible trivial free-flowing pericardial effusion along RV free wall // b. Echo 5/17: Mild LVH, EF 55-60%, normal wall motion, grade 1 diastolic dysfunction, trivial MR, severe LAE, mild RAE, PASP 42 mmHg   History of kidney stones    10/18/2019: per patient "has a couple currently one in each kidney"   History of nephrostomy 04/11/2015   currently inplace 04/28/2015  removed now   History of nuclear stress test    a. Myoview 1/14 - Marked ischemia in the basal anterior, mid anterior, apical septal and apical inferior regions, EF 63% >> LHC normal    Hyperlipidemia    Hypertension    medication removed from regimen due to low blood pressure    Iron deficiency    hx   Myocardial infarction [facility]) 2013  10/18/2019: per patient "in 2013)   Nephrolithiasis 2002, 2006   bilateral   Normal coronary arteries 2014   a. LHC in 1/14: normal coronary arteries   PAF (paroxysmal atrial fibrillation) ([place])    a. 10/ 2013  in setting of Septic Shock //  b. recurrent during admit for pneumonia, L effusion >> placed on Amiodarone // Coumadin for anticoagulation   Pneumonia jan 2018, last tme lungs clear now   dx 10-06-2014 per CXR--  on 10-27-2014 pt states finished antibiotic and denies cough or fever   Primary localized osteoarthritis of right hip 10/22/2019   S/P hemodialysis catheter insertion ([place]) 04/11/2015    right anterior chest , only used once    Sigmoid diverticulosis    SOB (shortness of breath) on exertion    10/18/2019: per patient "get short of breath with activity sometimes due to heart valve"   UTI (urinary tract infection) 05/10/2016         Patient Active Problem List   Diagnosis Date Noted   Current mild episode of major depressive disorder ([place])    Labile blood pressure    Chronic UTI    Loose stools    ESRD on dialysis ([place])    Acute on chronic anemia     Post-operative pain    Debility 11/03/2020   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   Compartment syndrome ([place]) 10/27/2020   Hip fracture ([place]) 06/07/2020   Leukocytosis 06/07/2020   Acute hypoxemic respiratory failure ([place]) 06/07/2020   Atrial fibrillation ([place]) 06/07/2020   ESRD (end stage renal disease) ([place]) 06/07/2020   Primary localized osteoarthritis of left knee 10/22/2019   S/P TKR (total knee replacement), left 10/22/2019   Gastroenteritis due to norovirus    Anticoagulated on Coumadin    Hiatal hernia    Melena    UGI bleed 08/23/2018   Hypokalemia 08/23/2018   Hyperlipidemia 08/23/2018   GERD (gastroesophageal reflux disease) 08/23/2018   Hypertension 08/23/2018   CAP (community acquired pneumonia) 07/02/2016   Dehydration    PNA (pneumonia) 05/10/2016   On amiodarone therapy 11/30/2015   Pleural effusion on left    Pleurisy    Hypoxemia    HCAP (healthcare-associated pneumonia) 11/21/2015   S/P thoracentesis    Chest pain 11/20/2015   Pleural effusion, left 11/20/2015   Pain in the chest    Pericardial effusion    Pleural effusion    Encounter for therapeutic drug monitoring 11/13/2015   Atrial fibrillation with RVR (H[facility])    PAF (paroxysmal atrial fibrillation) ([place]) 11/05/2015   Urinary tract infectious disease 10/31/2015   Pyelonephritis 09/25/2015   Sepsis ([place]) 09/25/2015   Anemia of renal disease 08/14/2015   Severe protein-calorie malnutrition ([place]) 05/27/2015   ESRD (end stage renal disease) ([place])    Normocytic anemia 11/23/2014   Essential hypertension, benign 08/14/2013   Glaucoma      Psychiatric History:  No prior psychiatric history  Family Med/Psych History:  Family History  Problem Relation Age of Onset   Hypertension Mother    Cancer Mother 76       breast   Dementia Mother    Hypertension Brother    Diabetes Brother    Heart disease Brother        before age 50   Cancer Father 46       pancreatic   Heart failure  Paternal Grandmother    Bladder Cancer Maternal Grandfather    Impression/DX:  [name] is a 74 year old female with  a past medical history of end-stage renal disease on hemodialysis, TTS, hypertension, PAF, glaucoma, osteoarthritis, aortic valve stenosis with DOE, prior CIR stay 2013 for debility post septic shock/encephalopathy.  Patient also had a hip fracture in December 2021 and was admitted via ED on 10/26/2020 with left upper extremity pain, swelling and numbness with reports of having infiltrated her aVF 5/3 but able to complete hemodialysis on 5/7 after brief rest.  Patient was found to have large intramuscular bicep hematoma with concerns for compartment syndrome and was taken to the OR on 5/10 for evacuation of hematoma and primary repair of left brachial artery pseudoaneurysm by Dr. Donzetta Matters.  Hemodialysis ongoing and patient continues to be limited by fatigue, nausea and other GI issues.  Due to deficits of left upper extremity pain as well as bilateral lower extremity weakness it with instability.  Patient was awake and alert when I entered the room reporting that she was fatigued.  Patient describes increasing fatigue post dialysis and greater difficulty with muscle weakness and instability.  Patient reports that it is been very challenging to her with all of the medical issues she has been developing and then she broke her hip, went to skilled nursing with little therapies provided and developed C. difficile and later had the issues with her blood vessel.  Patient reports that she is feel like she is getting good work in her therapies and is motivated to increase her strength and reports that her mood has been improving.    Disposition/Plan:  Today we worked on coping and adjustment issues around extended hospital stay and extended multiple issue complicated medical status for the past couple of years.  While I do not expect the patient need to see me again as she is improving I would remain  available if the need arises.         Electronically Signed   _______________________ Ilean Skill, Psy.D. Clinical Neuropsychologist

## 2020-11-27 NOTE — Progress Notes (Signed)
Occupational Therapy Session Note  Patient Details  Name: [name] MRN: [id number] Date of Birth: 12-17-1945  Today's Date: 11/27/2020 OT Individual Time: 1103-1202 OT Individual Time Calculation (min): 59 min    Skilled Therapeutic Interventions/Progress Updates:    Pt greeted in bed, Lt arm pain manageable, noticed she was using ice pack for it. Her ADL needs were met. She was motivated to go outdoors. Supervision for supine<sit and Max A for sit<stand in [place] with vcs. Escorted her via w/c to the outdoor patio. While sitting outside, educated pt on self ROM exercises for edema mgt/strengthening of the Lt UE. Education emphasis placed on coordinating breath with movement. She was then escorted via w/c back to the room. Stedy transfer completed to the recliner, Max A for sit<stand. Pt was assisted with repositioning for comfort, left with all needs within reach, ice for the Lt arm, and safety belt fastened.   Therapy Documentation Precautions:  Precautions Precautions: Fall Precaution Comments: L UE fistula with upper arm wound and pain Restrictions Weight Bearing Restrictions: No  Vital Signs: Therapy Vitals Temp: 97.9 F (36.6 C) Temp Source: Oral Pulse Rate: 66 Resp: 17 Patient Position (if appropriate): Lying Oxygen Therapy SpO2: 97 % O2 Device: Room Air  ADL: ADL Eating: Set up Where Assessed-Eating: Wheelchair Grooming: Minimal assistance Where Assessed-Grooming: Wheelchair Upper Body Bathing: Minimal assistance Where Assessed-Upper Body Bathing: Edge of bed Lower Body Bathing: Dependent Where Assessed-Lower Body Bathing: Edge of bed Upper Body Dressing: Minimal assistance Where Assessed-Upper Body Dressing: Edge of bed Lower Body Dressing: Dependent Where Assessed-Lower Body Dressing: Edge of bed Toileting: Not assessed Toilet Transfer Method: Not assessed Tub/Shower Transfer: Not assessed Social research officer, government: Not assessed     Therapy/Group:  Individual Therapy  Keelan Pomerleau A Rael Tilly 11/27/2020, 3:19 PM

## 2020-11-27 NOTE — Progress Notes (Signed)
[facility] PHYSICAL MEDICINE & REHABILITATION PROGRESS NOTE  Subjective/Complaints: Developed swelling in left forearm after HD last night which was painful. Feels better this morning. Swelling down too  ROS: Patient denies fever, rash, sore throat, blurred vision, nausea, vomiting, diarrhea, cough, shortness of breath or chest pain,  headache, or mood change.    Objective: Vital Signs: Blood pressure (!) 116/52, pulse 70, temperature 98 F (36.7 C), temperature source Oral, resp. rate 19, weight 76.2 kg, last menstrual period 01/19/1992, SpO2 98 %. No results found. Recent Labs    11/24/20 1400 11/26/20 1357  WBC 8.8 8.6  HGB 10.2* 11.2*  HCT 33.0* 36.1  PLT 252 217   Recent Labs    11/24/20 1400 11/26/20 1357  NA 136 134*  K 3.9 3.4*  CL 101 100  CO2 26 26  GLUCOSE 116* 119*  BUN 37* 16  CREATININE 6.01* 3.20*  CALCIUM 8.0* 7.6*    Intake/Output Summary (Last 24 hours) at 11/27/2020 1136 Last data filed at 11/27/2020 0733 Gross per 24 hour  Intake 230 ml  Output 2302 ml  Net -2072 ml        Physical Exam: BP (!) 116/52 (BP Location: Right Arm)   Pulse 70   Temp 98 F (36.7 C) (Oral)   Resp 19   Wt 76.2 kg   LMP 01/19/1992 (Approximate)   SpO2 98%   BMI 29.76 kg/m    Constitutional: No distress . Vital signs reviewed. HEENT: EOMI, oral membranes moist Neck: supple Cardiovascular: RRR without murmur. No JVD    Respiratory/Chest: CTA Bilaterally without wheezes or rales. Normal effort    GI/Abdomen: BS +, non-tender, non-distended Ext: no clubbing, cyanosis, minimal LUE edema, +L radial pulse Psych: pleasant and cooperative Skin: Warm and dry.  L upper arm incision CDI. Residual bruising decreased.  Right chest wall catheter CDI Musc: LUE still tender, sl swelling Left lower extreme edema trace Neuro: Alert Motor: RUE: 4-/5 proximal to distal LUE: 3+/5 proximal to distal--stable B/l LE: HF 3/5, KE, ADF 4/5  Assessment/Plan: 1. Functional  deficits which require 3+ hours per day of interdisciplinary therapy in a comprehensive inpatient rehab setting. Physiatrist is providing close team supervision and 24 hour management of active medical problems listed below. Physiatrist and rehab team continue to assess barriers to discharge/monitor patient progress toward functional and medical goals   Care Tool:  Bathing    Body parts bathed by patient: Left arm, Abdomen, Chest, Right upper leg, Left upper leg, Face, Right lower leg   Body parts bathed by helper: Right arm, Left lower leg, Buttocks, Front perineal area     Bathing assist Assist Level: Moderate Assistance - Patient 50 - 74%     Upper Body Dressing/Undressing Upper body dressing   What is the patient wearing?: Pull over shirt    Upper body assist Assist Level: Set up assist    Lower Body Dressing/Undressing Lower body dressing      What is the patient wearing?: Pants     Lower body assist Assist for lower body dressing: Moderate Assistance - Patient 50 - 74%     Toileting Toileting    Toileting assist Assist for toileting: Total Assistance - Patient < 25%     Transfers Chair/bed transfer  Transfers assist  Chair/bed transfer activity did not occur:  (sliding board)  Chair/bed transfer assist level: Moderate Assistance - Patient 50 - 74% Chair/bed transfer assistive device: Walker Chief Operating Officer)   Locomotion Ambulation   Ambulation assist  Ambulation activity did not occur: Safety/medical concerns  Assist level: 2 helpers Assistive device: Walker-platform Max distance: 23'   Walk 10 feet activity   Assist  Walk 10 feet activity did not occur: Safety/medical concerns  Assist level: 2 helpers Assistive device: Walker-platform   Walk 50 feet activity   Assist Walk 50 feet with 2 turns activity did not occur: Safety/medical concerns         Walk 150 feet activity   Assist Walk 150 feet activity did not occur:  Safety/medical concerns         Walk 10 feet on uneven surface  activity   Assist Walk 10 feet on uneven surfaces activity did not occur: Safety/medical concerns         Wheelchair     Assist Will patient use wheelchair at discharge?: Yes Type of Wheelchair: Manual    Wheelchair assist level: Minimal Assistance - Patient > 75% Max wheelchair distance: 50    Wheelchair 50 feet with 2 turns activity    Assist        Assist Level: Minimal Assistance - Patient > 75%   Wheelchair 150 feet activity     Assist      Assist Level: Dependent - Patient 0%   BP (!) 116/52 (BP Location: Right Arm)   Pulse 70   Temp 98 F (36.7 C) (Oral)   Resp 19   Wt 76.2 kg   LMP 01/19/1992 (Approximate)   SpO2 98%   BMI 29.76 kg/m   Medical Problem List and Plan: 1.  ICU myopathy with B/L foot drop/LE>UE weakness secondary to prolonged hospitalization/recent hospitalization for LUE thrombus s/p thrombectomy    -Continue CIR therapies including PT, OT     2.  Antithrombotics: -DVT/anticoagulation:  Pharmaceutical: Other (comment)--renal dose Eliquis.              -antiplatelet therapy: N/A 3. Pain Management: Continue Oxycodone prn for severe pain. Tramadol added prn for moderate pain.              on low dose gabapentin for neuropathic pain   6/10- pt reports pain controlled- continue above   .  4. Mood: LCSW to follow for evaluation and support.              -antipsychotic agents: N/A 5. Neuropsych: This patient is capable of making decisions on his own behalf. 6. Skin/Wound Care:  Routine pressure relief measures.  7. Fluids/Electrolytes/Nutrition: Strict I/Os with daily weights. Renal diet/1200 cc FR.  8. Hematoma LUE s/p evacuation: Continue to monitor pulse and for symptoms. Also elevate LUE 9. Acute on chronic anemia: Improved post transfusion. Continue aransep weekly.   Hb 11.2 6/9,  labs with HD 10. PAF: Continue amiodarone and Eliquis.   Controlled on  6/10, monitor TID    11.  ESRD: HD on TTS after therapy to help with tolerance of therapy.  12. Chronic UTI:   Continue Keflex for suppression 13. B/L foot drop- Prevalon boots for moderate B/L foot drop  6/7 reinforcing use of boots for skin/heel cord stretch 14. Leukocytosis  WBCs down to 8.6 6/9  Afebrile    15.  Loose stools  Fiber added on 5/20   Improved, stools formed 16. Labile blood pressure  6/9 controlled 17. Hemorrhoids.     rectal cream and suppository BID which have helped   -has tucks pad     LOS: 24 days A FACE TO FACE EVALUATION WAS PERFORMED  Meredith Staggers 11/27/2020, 11:36  AM

## 2020-11-27 NOTE — Progress Notes (Signed)
Occupational Therapy Note  Patient Details  Name: [name] MRN: [id number] Date of Birth: June 26, 1945  Today's Date: 11/27/2020 OT Individual Time: 4373-5789 OT Individual Time Calculation (min): 5 min  and Today's Date: 11/27/2020 OT Missed Time: 30 Minutes Missed Time Reason: Patient ill (comment) (n/v)  Pt received in room in bed with emesis back in hand. Pt c/o nausea/vomiting after lunch, unable to participate in therapy this day. Pt regretful she can't participate, encouraged to rest. Left with all needs in reach.  [employer] 11/27/2020, 3:44 PM

## 2020-11-27 NOTE — Progress Notes (Signed)
Occupational Therapy Session Note  Patient Details  Name: [name] MRN: [id number] Date of Birth: [birth year]  Today's Date: 11/27/2020 OT Individual Time: 4098-1191 OT Individual Time Calculation (min): 14 min    Short Term Goals: Week 4:  OT Short Term Goal 1 (Week 4): Pt will complete LB bathing/dressing mod A with AE and LRAD. OT Short Term Goal 2 (Week 4): Patient will complete 2/3 toileting tasks mod A using LRAD/AE PRN. OT Short Term Goal 3 (Week 4): Patient will complete toilet transfer with mod A using LRAD. OT Short Term Goal 4 (Week 4): Patient will complete UB bathing/dressing with set up A from w/c or EOB using AE PRN. OT Short Term Goal 5 (Week 4): Patient will complete dynamic standing ADL task with min A using LRAD.  Skilled Therapeutic Interventions/Progress Updates:    At 1400, pt received supine in bed with emesis bag reporting increased n/v and feeling unwell. Pt distressed and reporting feeling guilty for not feeling well for a therapy session but being unable to participate. Informed pt therapist would check in later to see if she felt better. RN provided nausea medication. Checked back at 1440 and pt was still experiencing n/v, but wanting to participate bed level. Completed LUE HEP (including whole hand grip, lateral pinch, pincer grasp, finger EXT, and finger opposition targeting intrinsic muscles) with yellow theraputty with min vc's for technique. During exercises, pt looked pale and distressed, reporting increased pain in LUE (see below) and increased nausea. Provided emotional support. Discontinued OT session due to pt feeling unwell.   Therapy Documentation Precautions:  Precautions Precautions: Fall Precaution Comments: L UE fistula with upper arm wound and pain Restrictions Weight Bearing Restrictions: No General: General OT Amount of Missed Time: 46 Minutes  Pain: Pain Assessment Pain Scale: 0-10 Pain Score:  (unrated) Pain Type: Acute  pain Pain Location: Hand Pain Orientation: Left Pain Descriptors / Indicators: Aching;Numbness;Burning Pain Onset: With Activity Pain Intervention(s): Repositioned;Rest;Distraction;Emotional support Multiple Pain Sites: No  Therapy/Group: Individual Therapy  [name] 11/27/2020, 3:30 PM

## 2020-11-27 NOTE — Progress Notes (Signed)
[facility] Progress Note   Subjective:   Patient seen and examined at bedside.  Reports swelling with pain in L forearm below access that started near the end of HD yesterday.  LU AVF was not in use.  States forearm swelled just enough "to make skin feel tight."  Resolved this AM after rest and ice.  Denies CP, SOB, orthopnea, n/v/d and abdominal pain.   Objective Vitals:   11/26/20 1725 11/26/20 1810 11/26/20 2010 11/27/20 0504  BP: (!) 142/59 (!) 143/49 (!) 148/51 (!) 116/52  Pulse: 71 70 75 70  Resp: 15 16 18 19   Temp: 97.7 F (36.5 C) 97.9 F (36.6 C) 98.1 F (36.7 C) 98 F (36.7 C)  TempSrc: Oral Oral Oral Oral  SpO2: 97% 98% 98% 98%  Weight: 69.7 kg   76.2 kg   Physical Exam General:well appearing female in NAD Heart:RRR, +8/1 systolic murmur Lungs:CTAB, nml WOB on RA Abdomen:soft, NTND Extremities:trace LE edema b/l Dialysis Access: [facility] and LU AVF +b/t   Filed Weights   11/26/20 1312 11/26/20 1725 11/27/20 0504  Weight: 72 kg 69.7 kg 76.2 kg    Intake/Output Summary (Last 24 hours) at 11/27/2020 1204 Last data filed at 11/27/2020 0733 Gross per 24 hour  Intake 200 ml  Output 2302 ml  Net -2102 ml    Additional Objective Labs: Basic Metabolic Panel: Recent Labs  Lab 11/23/20 0724 11/24/20 1400 11/26/20 1357  NA 136 136 134*  K 3.4* 3.9 3.4*  CL 100 101 100  CO2 26 26 26   GLUCOSE 92 116* 119*  BUN 22 37* 16  CREATININE 4.50* 6.01* 3.20*  CALCIUM 8.3* 8.0* 7.6*  PHOS 2.9 3.4 2.0*   Liver Function Tests: Recent Labs  Lab 11/23/20 0724 11/24/20 1400 11/26/20 1357  ALBUMIN 1.8* 1.6* 1.8*   No results for input(s): LIPASE, AMYLASE in the last 168 hours. CBC: Recent Labs  Lab 11/23/20 0724 11/24/20 1400 11/26/20 1357  WBC 9.2 8.8 8.6  HGB 12.4 10.2* 11.2*  HCT 39.9 33.0* 36.1  MCV 100.8* 101.5* 101.1*  PLT 233 252 217   Blood Culture    Component Value Date/Time   SDES URINE, RANDOM 07/17/2020 1500   SPECREQUEST   07/17/2020 1500    NONE Performed at [facility], [place] [address], [place], [place] [address]    CULT MULTIPLE SPECIES PRESENT, SUGGEST RECOLLECTION (A) 07/17/2020 1500   REPTSTATUS 07/18/2020 FINAL 07/17/2020 1500    Medications:   amiodarone  200 mg Oral Daily   apixaban  2.5 mg Oral BID   cephALEXin  250 mg Oral q1800   Chlorhexidine Gluconate Cloth  6 each Topical Q0600   diclofenac Sodium  2 g Topical QID   famotidine  20 mg Oral Daily   feeding supplement (NEPRO CARB STEADY)  237 mL Oral BID BM   gabapentin  300 mg Oral QHS   hydrocortisone   Rectal BID   hydrocortisone  25 mg Rectal BID   latanoprost  1 drop Both Eyes QHS   lidocaine-prilocaine  1 application Topical Once per day on Mon Wed Fri   multivitamin  1 tablet Oral QHS   polycarbophil  625 mg Oral BID AC   saccharomyces boulardii  250 mg Oral BID   timolol  1 drop Both Eyes Daily    Dialysis Orders: [facility] TTS 4h  400/500 71kg 2/2 bath P2  Hep none RIJ TDC (AVF works but infiltrated and not using) -Mircera 75 q 2 wks (  last 5/5) No VDRA    Assessment/Plan: LUE compartment syndrome: evacuation of hematoma/ repair of bleeding brachial artery pseudoaneurysm on 5/10 per Dr. Felipa Eth. Pain control per PMD. Now with pain/numbness, difficulty using L hand. Seen by VVS 11/25/20. No evidence of steal. Will need time for symptoms to resolve. No intervention needed. Reported new swelling of L forearm during dialysis yesterday, resolved with ice/rest. Unsure of etiology, continue to monitor.  ESRD -  HD TTS. Plan for HD tomorrow per regular schedule.  Hypertension/volume  - BP controlled. No BP lowering meds at home or here. UF as tolerated to get weights down and have encouraged fluid restriction. HD 06/07 Net UF 1536 ml. Still above OP EDW but volume status improving. Continue lowering volume as tolerated.  Anemia of CKD/ABLA   - Hgb 11.2.  ESA on hold. Nadir Hgb 6.5 since admission, s/p 2 unit prbcs 5/11.  Metabolic  bone disease -  CCa ok, No VDRA.  phos has been in range without binders, a little low yesterday.  Encourage nutrition.   Chronic combined CHF -- volume removal with UF on HD PAFib- On Eliquis/amiodarone  Nutrition - Renal diet/fluid restriction.  Encourage nutrition. Prot supp for low albumin Debility - now in CIR.  Jen Mow, PA-C [facility] 11/27/2020,12:04 PM  LOS: 24 days

## 2020-11-27 NOTE — Progress Notes (Signed)
Physical Therapy Session Note  Patient Details  Name: [name] MRN: [id number] Date of Birth: [birth year]  Today's Date: 11/27/2020 PT Individual Time: 0830-0930 PT Individual Time Calculation (min): 60 min   Short Term Goals: Week 4:  PT Short Term Goal 1 (Week 4): STG=LTG due to ELOS  Skilled Therapeutic Interventions/Progress Updates:    Patient in supine with RN in the room to deliver meds.  PA and MD from Nephrology service in the see pt.  Patient in supine assisted to don TEDS.  Patient supine to sit with min A and seated EOB to don pants with min A using reacher.  Sit to stand to [place] with mod A and pulled up pants with mod A.  Seated to doff then don shirt and bra with min A.  Patient transferred via stand step to w/c with PFRW and min A.  Patient at sink to brush teeth, comb hair.  PM&R MD in to visit with pt.  Assisted to gym in w/c.  Patient sit to stand to [place] with mod A and ambulated 2 x 15' with min A.  Patient used slide board for w/c to mat with min A and cues for foot placement and weight shift.  Seated EOM for core strength moving yellow physioball in and out then up and down then moving hip to hip.  Patient seated for LE therex including ankle DF/PF, hip flexion, LAQ then hip abduction/adduction all x 10.  Patient assisted via slide board to w/c with min A.  Assisted to room and left in w/c with call bell and needs in reach and L UE on pillow.   Therapy Documentation Precautions:  Precautions Precautions: Fall Precaution Comments: L UE fistula with upper arm wound and pain Restrictions Weight Bearing Restrictions: No  Pain: Pain Assessment Pain Scale: 0-10 Pain Score:  (unrated) Faces Pain Scale: Hurts a little bit Pain Type: Acute pain Pain Location: Arm Pain Orientation: Left Pain Descriptors / Indicators: Aching Pain Onset: On-going Pain Intervention(s): Rest;Repositioned Multiple Pain Sites: No    Therapy/Group: Individual Therapy  Reginia Naas Magda Kiel, PT 11/27/2020, 4:49 PM

## 2020-11-28 LAB — RENAL FUNCTION PANEL
Albumin: 1.6 g/dL — ABNORMAL LOW (ref 3.5–5.0)
Anion gap: 9 (ref 5–15)
BUN: 29 mg/dL — ABNORMAL HIGH (ref 8–23)
CO2: 25 mmol/L (ref 22–32)
Calcium: 7.6 mg/dL — ABNORMAL LOW (ref 8.9–10.3)
Chloride: 99 mmol/L (ref 98–111)
Creatinine, Ser: 4.78 mg/dL — ABNORMAL HIGH (ref 0.44–1.00)
GFR, Estimated: 9 mL/min — ABNORMAL LOW (ref >60)
Glucose, Bld: 123 mg/dL — ABNORMAL HIGH (ref 70–99)
Phosphorus: 3.4 mg/dL (ref 2.5–4.6)
Potassium: 4 mmol/L (ref 3.5–5.1)
Sodium: 133 mmol/L — ABNORMAL LOW (ref 135–145)

## 2020-11-28 LAB — CBC
HCT: 34.1 % — ABNORMAL LOW (ref 36.0–46.0)
Hemoglobin: 10.8 g/dL — ABNORMAL LOW (ref 12.0–15.0)
MCH: 32 pg (ref 26.0–34.0)
MCHC: 31.7 g/dL (ref 30.0–36.0)
MCV: 101.2 fL — ABNORMAL HIGH (ref 80.0–100.0)
Platelets: 232 10*3/uL (ref 150–400)
RBC: 3.37 MIL/uL — ABNORMAL LOW (ref 3.87–5.11)
RDW: 18.3 % — ABNORMAL HIGH (ref 11.5–15.5)
WBC: 8.9 10*3/uL (ref 4.0–10.5)
nRBC: 0 % (ref 0.0–0.2)

## 2020-11-28 LAB — HEPATITIS B SURFACE ANTIGEN: Hepatitis B Surface Ag: NONREACTIVE

## 2020-11-28 MED ORDER — HEPARIN SODIUM (PORCINE) 1000 UNIT/ML IJ SOLN
INTRAMUSCULAR | Status: AC
  Filled 2020-11-28: qty 4

## 2020-11-28 NOTE — Progress Notes (Signed)
Occupational Therapy Session Note  Patient Details  Name: [name] MRN: [id number] Date of Birth: March 28, 1946  Today's Date: 11/28/2020 OT Individual Time: 1062-6948 OT Individual Time Calculation (min): 60 min    Short Term Goals: Week 3:  OT Short Term Goal 1 (Week 3): Patient will complete UB bathing/dressing set up A using AE PRN from w/c or EOB. OT Short Term Goal 1 - Progress (Week 3): Progressing toward goal OT Short Term Goal 2 (Week 3): Patient will complete LB bathing mod A using AE PRN. OT Short Term Goal 2 - Progress (Week 3): Progressing toward goal OT Short Term Goal 3 (Week 3): Patient will complete LB dressing mod A using AE PRN. OT Short Term Goal 3 - Progress (Week 3): Met OT Short Term Goal 4 (Week 3): Patient will complete 2/3 toileting tasks mod A using LRAD. OT Short Term Goal 4 - Progress (Week 3): Not met OT Short Term Goal 5 (Week 3): Pt will complete toilet transfer mod A consistently using LRAD. OT Short Term Goal 5 - Progress (Week 3): Not met  Skilled Therapeutic Interventions/Progress Updates:    OT session focused on functional transfers, ADL retraining, L-NMR, and standing balance. Pt received supine in bed initiating doffing brief, therefore completed at bed level using rolling to promote core strength. Pt completed dressing sitting EOB with min A for UB to manage buttons and mod A for LB as pt also used reacher. Completed sit<>stand with mod A and PFRW as OT managed clothing around waist. Completed stand pivot transfer to w/c with PFRW and mod A to stand and min A for transfer. Transitioned to gym with pt engaging in UB/core strengthening and L NMR exercises via lateral and diagonal trunk rotation with ball 2x 20 reps. Also completed 2 sets of 5 sec squeezes into ball focusing on activating all digits. Pt returned to room and left sitting in w/c with safety belt and all needs in reach.   Therapy Documentation Precautions:  Precautions Precautions:  Fall Precaution Comments: L UE fistula with upper arm wound and pain Restrictions Weight Bearing Restrictions: No General:   Vital Signs:   Pain: Pain Assessment Pain Scale: 0-10 Pain Score: 3  Faces Pain Scale: Hurts a little bit Pain Type: Acute pain Pain Location: Head Pain Orientation: Left Pain Descriptors / Indicators: Aching ADL: ADL Eating: Set up Where Assessed-Eating: Wheelchair Grooming: Minimal assistance Where Assessed-Grooming: Wheelchair Upper Body Bathing: Minimal assistance Where Assessed-Upper Body Bathing: Edge of bed Lower Body Bathing: Dependent Where Assessed-Lower Body Bathing: Edge of bed Upper Body Dressing: Minimal assistance Where Assessed-Upper Body Dressing: Edge of bed Lower Body Dressing: Dependent Where Assessed-Lower Body Dressing: Edge of bed Toileting: Not assessed Toilet Transfer Method: Not assessed Tub/Shower Transfer: Not assessed Social research officer, government: Not assessed Vision   Perception    Praxis   Exercises:   Other Treatments:     Therapy/Group: Individual Therapy  Duayne Cal 11/28/2020, 12:16 PM

## 2020-11-28 NOTE — Progress Notes (Signed)
[facility]ABILIT[facility] PROGRESS NOTE  Subjective/Complaints:  Pt reports no issues- just waking up. Keeps working LUE to make it stronger.    ROS:  Pt denies SOB, abd pain, CP, N/V/C/D, and vision changes .    Objective: Vital Signs: Blood pressure (!) 151/66, pulse 70, temperature 97.6 F (36.4 C), temperature source Oral, resp. rate 14, weight 73.6 kg, last menstrual period 01/19/1992, SpO2 96 %. No results found. Recent Labs    11/26/20 1357 11/28/20 1330  WBC 8.6 8.9  HGB 11.2* 10.8*  HCT 36.1 34.1*  PLT 217 232   Recent Labs    11/26/20 1357 11/28/20 1330  NA 134* 133*  K 3.4* 4.0  CL 100 99  CO2 26 25  GLUCOSE 119* 123*  BUN 16 29*  CREATININE 3.20* 4.78*  CALCIUM 7.6* 7.6*    Intake/Output Summary (Last 24 hours) at 11/28/2020 2021 Last data filed at 11/28/2020 1800 Gross per 24 hour  Intake 500 ml  Output 1944 ml  Net -1444 ml        Physical Exam: BP (!) 151/66   Pulse 70   Temp 97.6 F (36.4 C) (Oral)   Resp 14   Wt 73.6 kg   LMP 01/19/1992 (Approximate)   SpO2 96%   BMI 28.74 kg/m     General: awake, alert, appropriate, just woke up'  NAD HENT: conjugate gaze; oropharynx moist CV: regular rate; no JVD Pulmonary: CTA B/L; no W/R/R- good air movement GI: soft, NT, ND, (+)BS Psychiatric: appropriate but flat Neurological: alert  Ext: no clubbing, cyanosis, minimal LUE edema, +L radial pulse Psych: pleasant and cooperative Skin: Warm and dry.  L upper arm incision CDI. Residual bruising decreased.  Right chest wall catheter CDI Musc: LUE still tender, sl swelling Left lower extreme edema trace Neuro: Alert Motor: RUE: 4-/5 proximal to distal LUE: 3+/5 proximal to distal--stable B/l LE: HF 3/5, KE, ADF 4/5  Assessment/Plan: 1. Functional deficits which require 3+ hours per day of interdisciplinary therapy in a comprehensive inpatient rehab setting. Physiatrist is providing close team supervision and 24  hour management of active medical problems listed below. Physiatrist and rehab team continue to assess barriers to discharge/monitor patient progress toward functional and medical goals   Care Tool:  Bathing    Body parts bathed by patient: Left arm, Abdomen, Chest, Right upper leg, Left upper leg, Face, Right lower leg   Body parts bathed by helper: Right arm, Left lower leg, Buttocks, Front perineal area     Bathing assist Assist Level: Moderate Assistance - Patient 50 - 74%     Upper Body Dressing/Undressing Upper body dressing   What is the patient wearing?: Button up shirt    Upper body assist Assist Level: Minimal Assistance - Patient > 75%    Lower Body Dressing/Undressing Lower body dressing      What is the patient wearing?: Pants, Incontinence brief     Lower body assist Assist for lower body dressing: Moderate Assistance - Patient 50 - 74%     Toileting Toileting    Toileting assist Assist for toileting: Total Assistance - Patient < 25%     Transfers Chair/bed transfer  Transfers assist  Chair/bed transfer activity did not occur:  (sliding board)  Chair/bed transfer assist level: Minimal Assistance - Patient > 75% Chair/bed transfer assistive device: Walker (platform walker)   Locomotion Ambulation   Ambulation assist   Ambulation activity did not occur: Safety/medical concerns  Assist level: 2 helpers  Assistive device: Walker-platform Max distance: 23'   Walk 10 feet activity   Assist  Walk 10 feet activity did not occur: Safety/medical concerns  Assist level: 2 helpers Assistive device: Walker-platform   Walk 50 feet activity   Assist Walk 50 feet with 2 turns activity did not occur: Safety/medical concerns         Walk 150 feet activity   Assist Walk 150 feet activity did not occur: Safety/medical concerns         Walk 10 feet on uneven surface  activity   Assist Walk 10 feet on uneven surfaces activity did not  occur: Safety/medical concerns         Wheelchair     Assist Will patient use wheelchair at discharge?: Yes Type of Wheelchair: Manual    Wheelchair assist level: Minimal Assistance - Patient > 75% Max wheelchair distance: 50    Wheelchair 50 feet with 2 turns activity    Assist        Assist Level: Minimal Assistance - Patient > 75%   Wheelchair 150 feet activity     Assist      Assist Level: Dependent - Patient 0%   BP (!) 151/66   Pulse 70   Temp 97.6 F (36.4 C) (Oral)   Resp 14   Wt 73.6 kg   LMP 01/19/1992 (Approximate)   SpO2 96%   BMI 28.74 kg/m   Medical Problem List and Plan: 1.  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  Antithrombotics: -DVT/anticoagulation:  Pharmaceutical: Other (comment)--renal dose Eliquis.              -antiplatelet therapy: N/A 3. Pain Management: Continue Oxycodone prn for severe pain. Tramadol added prn for moderate pain.              on low dose gabapentin for neuropathic pain   6/11- pain controlled- con't regimen 4. Mood: LCSW to follow for evaluation and support.              -antipsychotic agents: N/A 5. Neuropsych: This patient is capable of making decisions on her own behalf. 6. Skin/Wound Care:  Routine pressure relief measures.  7. Fluids/Electrolytes/Nutrition: Strict I/Os with daily weights. Renal diet/1200 cc FR.  8. Hematoma LUE s/p evacuation: Continue to monitor pulse and for symptoms. Also elevate LUE 9. Acute on chronic anemia: Improved post transfusion. Continue aransep weekly.   Hb 11.2 6/9,  labs with HD 10. PAF: Continue amiodarone and Eliquis.   Controlled on 6/10, monitor TID    11.  ESRD: HD on TTS after therapy to help with tolerance of therapy.   6/11- tolerated HD today- con't regimen 12. Chronic UTI:   Continue Keflex  for suppression 13. B/L foot drop- Prevalon boots for moderate B/L foot drop  6/7 reinforcing use of boots for skin/heel cord stretch 14. Leukocytosis  WBCs down to 8.6 6/9  Afebrile    15.  Loose stools  Fiber added on 5/20   Improved, stools formed 16. Labile blood pressure  6/11- BP slightly elevated- con't to monitor for trend 17. Hemorrhoids.     rectal cream and suppository BID which have helped   -has tucks pad     LOS: 25 days A FACE TO FACE EVALUATION WAS PERFORMED  Kasi Lasky 11/28/2020, 8:21 PM

## 2020-11-28 NOTE — Progress Notes (Signed)
[facility] Progress Note   Subjective:   Patient seen and examined in room.  Sitting in wheelchair next to bed.  States her daughter is bringing her car on Monday so she can practice getting in and out of it to prepare for d/c.   No specific complaints today.  No additional swelling of LUE.  Denies CP, SOB, n/v/d and abdominal pain.   Objective Vitals:   11/27/20 0504 11/27/20 1430 11/27/20 2020 11/28/20 0542  BP: (!) 116/52  (!) 130/57 121/67  Pulse: 70 66 72 65  Resp: 19 17 18 19   Temp: 98 F (36.7 C) 97.9 F (36.6 C) 99.5 F (37.5 C) 98.6 F (37 C)  TempSrc: Oral Oral Oral Oral  SpO2: 98% 97% 96% 96%  Weight: 76.2 kg   78.4 kg   Physical Exam General: well appearing female in NAD, sitting in wheelchair Heart:RRR, +6/4 systolic murmur Lungs:CTAB, nml WOB on RA Abdomen:soft, NTND Extremities:trace LE edema Dialysis Access: [facility] and LU AVF +b/t   Filed Weights   11/26/20 1725 11/27/20 0504 11/28/20 0542  Weight: 69.7 kg 76.2 kg 78.4 kg    Intake/Output Summary (Last 24 hours) at 11/28/2020 1222 Last data filed at 11/28/2020 0830 Gross per 24 hour  Intake 560 ml  Output --  Net 560 ml    Additional Objective Labs: Basic Metabolic Panel: Recent Labs  Lab 11/23/20 0724 11/24/20 1400 11/26/20 1357  NA 136 136 134*  K 3.4* 3.9 3.4*  CL 100 101 100  CO2 26 26 26   GLUCOSE 92 116* 119*  BUN 22 37* 16  CREATININE 4.50* 6.01* 3.20*  CALCIUM 8.3* 8.0* 7.6*  PHOS 2.9 3.4 2.0*   Liver Function Tests: Recent Labs  Lab 11/23/20 0724 11/24/20 1400 11/26/20 1357  ALBUMIN 1.8* 1.6* 1.8*    CBC: Recent Labs  Lab 11/23/20 0724 11/24/20 1400 11/26/20 1357  WBC 9.2 8.8 8.6  HGB 12.4 10.2* 11.2*  HCT 39.9 33.0* 36.1  MCV 100.8* 101.5* 101.1*  PLT 233 252 217    Medications:   amiodarone  200 mg Oral Daily   apixaban  2.5 mg Oral BID   cephALEXin  250 mg Oral q1800   Chlorhexidine Gluconate Cloth  6 each Topical Q0600   diclofenac Sodium   2 g Topical QID   famotidine  20 mg Oral Daily   feeding supplement (NEPRO CARB STEADY)  237 mL Oral BID BM   gabapentin  300 mg Oral QHS   hydrocortisone   Rectal BID   hydrocortisone  25 mg Rectal BID   latanoprost  1 drop Both Eyes QHS   lidocaine-prilocaine  1 application Topical Once per day on Mon Wed Fri   multivitamin  1 tablet Oral QHS   polycarbophil  625 mg Oral BID AC   saccharomyces boulardii  250 mg Oral BID   timolol  1 drop Both Eyes Daily    Dialysis Orders: [facility] TTS 4h  400/500 71kg 2/2 bath P2  Hep none RIJ TDC (AVF works but infiltrated and not using) -Mircera 75 q 2 wks (last 5/5) No VDRA    Assessment/Plan: LUE compartment syndrome: evacuation of hematoma/ repair of bleeding brachial artery pseudoaneurysm on 5/10 per Dr. Felipa Eth. Pain control per PMD. Now with pain/numbness, difficulty using L hand. Seen by VVS 11/25/20. No evidence of steal. Will need time for symptoms to resolve. No intervention needed. Reported new swelling of L forearm during last dialysis, resolved with ice/rest. Unsure of etiology, continue to  monitor.  ESRD -  HD TTS. Plan for HD today per regular schedule.  Hypertension/volume  - BP controlled. No BP lowering meds at home or here. UF as tolerated to get weights down and have encouraged fluid restriction. Weights all over the place the last few days.  Need standing weight to better assess.  Continue lowering volume as tolerated.  Anemia of CKD/ABLA   - Hgb 11.2.  ESA on hold. Nadir Hgb 6.5 since admission, s/p 2 unit prbcs 5/11.  Metabolic bone disease -  CCa ok, No VDRA.  phos has been in range without binders, last a little low.  Checking again today.  Encourage nutrition.   Chronic combined CHF -- volume removal with UF on HD PAFib- On Eliquis/amiodarone  Nutrition - Renal diet/fluid restriction.  Encourage nutrition. Prot supp for low albumin Debility - now in CIR.  Jen Mow, PA-C [facility] 11/28/2020,12:22  PM  LOS: 25 days

## 2020-11-29 NOTE — Progress Notes (Signed)
Occupational Therapy Session Note  Patient Details  Name: CLARK CLOWDUS MRN: [id number] Date of Birth: 06-Nov-1945  Today's Date: 11/29/2020 OT Individual Time: 3013-1438 OT Individual Time Calculation (min): 58 min   Short Term Goals: Week 1:  OT Short Term Goal 1 (Week 1): Pt will complete UB dressing and bathing with supervision EOB or from wheelchair. OT Short Term Goal 1 - Progress (Week 1): Progressing toward goal OT Short Term Goal 2 (Week 1): Pt will complete LB bathing with max assist sit to stand for two consecutive sessions. OT Short Term Goal 2 - Progress (Week 1): Partly met OT Short Term Goal 3 (Week 1): Pt will donn pants with max assist sit to stand OT Short Term Goal 3 - Progress (Week 1): Partly met OT Short Term Goal 4 (Week 1): Pt will complete stand pivot transfer to the 3:1 with mod assist using the RW for support. OT Short Term Goal 4 - Progress (Week 1): Not met OT Short Term Goal 5 (Week 1): Pt will use the LUE to wash the RUE with supervision and min instructional cueing for initiation. OT Short Term Goal 5 - Progress (Week 1): Progressing toward goal  Skilled Therapeutic Interventions/Progress Updates:    Pt greeted in the recliner, just finishing up lunch. She reported having some Lt forearm pain but premedicated/manageable. Her ADL needs were met. Discussed DME for showering at home. Pt reports she currently has an HD port and is unable to shower. Wants to wait for f/u HHOT to assess her bathroom situation before thinking about shower DME options, as bathroom is small and will be difficult to manage the RW in/out of. She also declined toileting. Tried sit<stand x2 using PFRW, pt requiring Max A for power up from low recliner seat. Due to bilateral knee buckling, pt returned to sitting position both times. She reports it is common for her knees to buckle some days. Therefore slideboard<w/c completed with Mod A, going up slight incline. Once she was situated in the  w/c, pt was escorted outdoors. Guided her through LE exercises including seated marches, long arc quads, and ankle pumps x15 reps 3 sets. Worked on UB strengthening/w/c proficiency by navigating in straight path over unlevel brick and cement surface with slight incline. Pt required vcs for technique to offset Lt veering, Mod A to maneuver up incline. At end of session, pt was returned to the room and opted to remain sitting up in the w/c, all needs within reach. Tx focus was placed on transfer training, activity tolerance, and UB/LB strengthening for functional carryover during self care tasks + transfers.   Therapy Documentation Precautions:  Precautions Precautions: Fall Precaution Comments: L UE fistula with upper arm wound and pain Restrictions Weight Bearing Restrictions: No    ADL: ADL Eating: Set up Where Assessed-Eating: Wheelchair Grooming: Minimal assistance Where Assessed-Grooming: Wheelchair Upper Body Bathing: Minimal assistance Where Assessed-Upper Body Bathing: Edge of bed Lower Body Bathing: Dependent Where Assessed-Lower Body Bathing: Edge of bed Upper Body Dressing: Minimal assistance Where Assessed-Upper Body Dressing: Edge of bed Lower Body Dressing: Dependent Where Assessed-Lower Body Dressing: Edge of bed Toileting: Not assessed Toilet Transfer Method: Not assessed Tub/Shower Transfer: Not assessed Social research officer, government: Not assessed      Therapy/Group: Individual Therapy  Jasiya Markie A [place] 11/29/2020, 3:45 PM

## 2020-11-29 NOTE — Progress Notes (Signed)
[name] Progress Note   Subjective:   Patient seen and examined at bedside.  No new complaints.  No swelling in LUE with HD yesterday.   Denies CP, SOB, n/v/d and abdominal pain.   Objective Vitals:   11/28/20 1700 11/28/20 1728 11/28/20 2045 11/29/20 0547  BP: (!) 144/65 (!) 151/66 (!) 129/55 (!) 140/58  Pulse: 68 70 90 69  Resp: 14 14 17 17   Temp:   98.1 F (36.7 C) 97.9 F (36.6 C)  TempSrc:   Oral Oral  SpO2:   98% 97%  Weight:    77 kg   Physical Exam General:chronically ill appearing female in NAD Heart:RRR, +6/2 systolic murmur Lungs:CTAB, nml WOB Abdomen:soft, NTND Extremities:no LE edema Dialysis Access: [facility] c/d/I, LU AVF resting +b/t   Filed Weights   11/28/20 0542 11/28/20 1310 11/29/20 0547  Weight: 78.4 kg 73.6 kg 77 kg    Intake/Output Summary (Last 24 hours) at 11/29/2020 0923 Last data filed at 11/29/2020 0800 Gross per 24 hour  Intake 580 ml  Output 1944 ml  Net -1364 ml    Additional Objective Labs: Basic Metabolic Panel: Recent Labs  Lab 11/24/20 1400 11/26/20 1357 11/28/20 1330  NA 136 134* 133*  K 3.9 3.4* 4.0  CL 101 100 99  CO2 26 26 25   GLUCOSE 116* 119* 123*  BUN 37* 16 29*  CREATININE 6.01* 3.20* 4.78*  CALCIUM 8.0* 7.6* 7.6*  PHOS 3.4 2.0* 3.4   Liver Function Tests: Recent Labs  Lab 11/24/20 1400 11/26/20 1357 11/28/20 1330  ALBUMIN 1.6* 1.8* 1.6*   No results for input(s): LIPASE, AMYLASE in the last 168 hours. CBC: Recent Labs  Lab 11/23/20 0724 11/24/20 1400 11/26/20 1357 11/28/20 1330  WBC 9.2 8.8 8.6 8.9  HGB 12.4 10.2* 11.2* 10.8*  HCT 39.9 33.0* 36.1 34.1*  MCV 100.8* 101.5* 101.1* 101.2*  PLT 233 252 217 232    Medications:   amiodarone  200 mg Oral Daily   apixaban  2.5 mg Oral BID   cephALEXin  250 mg Oral q1800   Chlorhexidine Gluconate Cloth  6 each Topical Q0600   famotidine  20 mg Oral Daily   feeding supplement (NEPRO CARB STEADY)  237 mL Oral BID BM   gabapentin  300  mg Oral QHS   hydrocortisone   Rectal BID   hydrocortisone  25 mg Rectal BID   latanoprost  1 drop Both Eyes QHS   lidocaine-prilocaine  1 application Topical Once per day on Mon Wed Fri   multivitamin  1 tablet Oral QHS   polycarbophil  625 mg Oral BID AC   saccharomyces boulardii  250 mg Oral BID   timolol  1 drop Both Eyes Daily    Dialysis Orders: [facility] TTS 4h  400/500 71kg 2/2 bath P2  Hep none RIJ TDC (AVF works but infiltrated and not using) -Mircera 75 q 2 wks (last 5/5) No VDRA    Assessment/Plan: LUE compartment syndrome: evacuation of hematoma/ repair of bleeding brachial artery pseudoaneurysm on 5/10 per Dr. Felipa Eth. Pain control per PMD. Now with pain/numbness, difficulty using L hand. Seen by [facility] 11/25/20. No evidence of steal. Will need time for symptoms to resolve. No intervention needed. Reported new swelling of L forearm during dialysis 6/9, resolved with ice/rest. Did not reoccur with HD yesterday. Unsure of etiology, continue to monitor.  ESRD -  HD TTS. Next HD 12/01/20.  Hypertension/volume  - BP mostly well controlled. No BP lowering meds at home  or here currently. UF as tolerated to get weights down and have encouraged fluid restriction. Weights all over the place the last few days.  Need standing weight to better assess.  Continue lowering volume as tolerated.  Anemia of CKD/ABLA   - Hgb 10.8.  ESA on hold. Nadir Hgb 6.5 since admission, s/p 2 unit prbcs 5/11.  Metabolic bone disease -  CCa ok, No VDRA.  phos has been in range without binders. Encourage nutrition.   Chronic combined CHF -- volume removal with UF on HD PAFib- On Eliquis/amiodarone  Nutrition - Renal diet/fluid restriction.  Alb 1.6. Encourage nutrition. Prot supp for low albumin Debility - now in CIR.  Jen Mow, PA-C [facility] 11/29/2020,9:23 AM  LOS: 26 days

## 2020-11-29 NOTE — Progress Notes (Signed)
Occupational Therapy Session Note  Patient Details  Name: [name] MRN: [id number] Date of Birth: 10/06/1945  Today's Date: 11/29/2020 OT Group Time: 1100-1200 OT Group Time Calculation (min): 60 min  Skilled Therapeutic Interventions/Progress Updates:    Pt engaged in therapeutic w/c level dance group focusing on patient choice, UE/LE strengthening, salience, activity tolerance, and social participation. Pt was guided through various dance-based exercises involving UEs/LEs and trunk. All music was selected by group members. Emphasis placed on Lt UE ROM, general strength and endurance, and activity tolerance. Pt participated well while seated, able to simultaneously move UEs/LEs. Mostly active movement of the Lt UE but did incorporate active assist techniques when fatigued to include her affected limb. She took several rest breaks but participated very well. At end of session pt was returned to the room by OT.    Therapy Documentation Precautions:  Precautions Precautions: Fall Precaution Comments: L UE fistula with upper arm wound and pain Restrictions Weight Bearing Restrictions: No  Pain: no s/s pain during tx   ADL: ADL Eating: Set up Where Assessed-Eating: Wheelchair Grooming: Minimal assistance Where Assessed-Grooming: Wheelchair Upper Body Bathing: Minimal assistance Where Assessed-Upper Body Bathing: Edge of bed Lower Body Bathing: Dependent Where Assessed-Lower Body Bathing: Edge of bed Upper Body Dressing: Minimal assistance Where Assessed-Upper Body Dressing: Edge of bed Lower Body Dressing: Dependent Where Assessed-Lower Body Dressing: Edge of bed Toileting: Not assessed Toilet Transfer Method: Not assessed Tub/Shower Transfer: Not assessed Gaffer Transfer: Not assessed    Therapy/Group: Group Therapy  Kellsey Sansone A Zyeir Dymek 11/29/2020, 12:44 PM

## 2020-11-30 MED ORDER — LIDOCAINE 5 % EX PTCH
1.0000 | MEDICATED_PATCH | CUTANEOUS | Status: DC
  Administered 2020-11-30 – 2020-12-01 (×2): 1 via TRANSDERMAL
  Filled 2020-11-30 (×2): qty 1

## 2020-11-30 NOTE — Progress Notes (Signed)
[place] PHYSICAL MEDICINE & REHABILITATION PROGRESS NOTE  Subjective/Complaints: C/o left shoulder and hand pain. Discussed addition of lidocaine patch and she and daughter are agreeable  ROS:  Pt denies SOB, abd pain, CP, N/V/C/D, and vision changes, +left shoulder and hand pain   Objective: Vital Signs: Blood pressure (!) 158/57, pulse 70, temperature 98.4 F (36.9 C), temperature source Oral, resp. rate 17, weight 74.6 kg, last menstrual period 01/19/1992, SpO2 98 %. No results found. Recent Labs    11/28/20 1330  WBC 8.9  HGB 10.8*  HCT 34.1*  PLT 232   Recent Labs    11/28/20 1330  NA 133*  K 4.0  CL 99  CO2 25  GLUCOSE 123*  BUN 29*  CREATININE 4.78*  CALCIUM 7.6*    Intake/Output Summary (Last 24 hours) at 11/30/2020 1621 Last data filed at 11/30/2020 1610 Gross per 24 hour  Intake 360 ml  Output --  Net 360 ml        Physical Exam: BP (!) 158/57 (BP Location: Right Arm)   Pulse 70   Temp 98.4 F (36.9 C) (Oral)   Resp 17   Wt 74.6 kg   LMP 01/19/1992 (Approximate)   SpO2 98%   BMI 29.13 kg/m   Gen: no distress, normal appearing HEENT: oral mucosa pink and moist, NCAT Cardio: Reg rate Chest: normal effort, normal rate of breathing Abd: soft, non-distended Ext: no clubbing, cyanosis, minimal LUE edema, +L radial pulse Psych: pleasant and cooperative Skin: Warm and dry.  L upper arm incision CDI. Residual bruising decreased.  Right chest wall catheter CDI Musc: LUE still tender, sl swelling Left lower extreme edema trace Neuro: Alert Motor: RUE: 4-/5 proximal to distal LUE: 3+/5 proximal to distal--stable B/l LE: HF 3/5, KE, ADF 4/5  Assessment/Plan: 1. Functional deficits which require 3+ hours per day of interdisciplinary therapy in a comprehensive inpatient rehab setting. Physiatrist is providing close team supervision and 24 hour management of active medical problems listed below. Physiatrist and rehab team continue to assess  barriers to discharge/monitor patient progress toward functional and medical goals   Care Tool:  Bathing    Body parts bathed by patient: Left arm, Abdomen, Chest, Right upper leg, Left upper leg, Face, Right lower leg   Body parts bathed by helper: Right arm, Left lower leg, Buttocks, Front perineal area     Bathing assist Assist Level: Moderate Assistance - Patient 50 - 74%     Upper Body Dressing/Undressing Upper body dressing   What is the patient wearing?: Button up shirt    Upper body assist Assist Level: Minimal Assistance - Patient > 75%    Lower Body Dressing/Undressing Lower body dressing      What is the patient wearing?: Pants, Incontinence brief     Lower body assist Assist for lower body dressing: Moderate Assistance - Patient 50 - 74%     Toileting Toileting    Toileting assist Assist for toileting: Total Assistance - Patient < 25%     Transfers Chair/bed transfer  Transfers assist  Chair/bed transfer activity did not occur:  (sliding board)  Chair/bed transfer assist level: Minimal Assistance - Patient > 75% Chair/bed transfer assistive device: Walker (platform walker)   Locomotion Ambulation   Ambulation assist   Ambulation activity did not occur: Safety/medical concerns  Assist level: 2 helpers Assistive device: Walker-platform Max distance: 23'   Walk 10 feet activity   Assist  Walk 10 feet activity did not occur: Safety/medical concerns  Assist level: 2 helpers Assistive device: Walker-platform   Walk 50 feet activity   Assist Walk 50 feet with 2 turns activity did not occur: Safety/medical concerns         Walk 150 feet activity   Assist Walk 150 feet activity did not occur: Safety/medical concerns         Walk 10 feet on uneven surface  activity   Assist Walk 10 feet on uneven surfaces activity did not occur: Safety/medical concerns         Wheelchair     Assist Will patient use wheelchair at  discharge?: Yes Type of Wheelchair: Manual    Wheelchair assist level: Minimal Assistance - Patient > 75% Max wheelchair distance: 50    Wheelchair 50 feet with 2 turns activity    Assist        Assist Level: Minimal Assistance - Patient > 75%   Wheelchair 150 feet activity     Assist      Assist Level: Dependent - Patient 0%   BP (!) 158/57 (BP Location: Right Arm)   Pulse 70   Temp 98.4 F (36.9 C) (Oral)   Resp 17   Wt 74.6 kg   LMP 01/19/1992 (Approximate)   SpO2 98%   BMI 29.13 kg/m   Medical Problem List and Plan: 1.  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  Antithrombotics: -DVT/anticoagulation:  Pharmaceutical: Other (comment)--renal dose Eliquis.              -antiplatelet therapy: N/A 3. Pain Management: Continue Oxycodone prn for severe pain. Tramadol added prn for moderate pain.              on low dose gabapentin for neuropathic pain. Add lidocaine patch to left shoulder 4. Mood: LCSW to follow for evaluation and support.              -antipsychotic agents: N/A 5. Neuropsych: This patient is capable of making decisions on her own behalf. 6. Skin/Wound Care:  Routine pressure relief measures.  7. Fluids/Electrolytes/Nutrition: Strict I/Os with daily weights. Renal diet/1200 cc FR.  8. Hematoma LUE s/p evacuation: Continue to monitor pulse and for symptoms. Also elevate LUE 9. Acute on chronic anemia: Improved post transfusion. Continue aransep weekly.   Hb 11.2 6/9,  labs with HD 10. PAF: Continue amiodarone and Eliquis.   Controlled on 6/13, monitor TID    11.  ESRD: HD on TTS after therapy to help with tolerance of therapy.  12. Chronic UTI:   Continue Keflex for suppression 13. B/L foot drop- Prevalon boots for moderate B/L foot drop  6/7 reinforcing use of boots for  skin/heel cord stretch 14. Leukocytosis  WBCs down to 8.6 6/9  Afebrile    15.  Loose stools  Fiber added on 5/20   Improved, stools formed 16. Labile blood pressure  6/11- BP slightly elevated- con't to monitor for trend 17. Hemorrhoids.     rectal cream and suppository BID which have helped   -has tucks pad     LOS: 27 days A FACE TO FACE EVALUATION WAS PERFORMED  [name] 11/30/2020, 4:21 PM

## 2020-11-30 NOTE — Progress Notes (Signed)
Physical Therapy Session Note  Patient Details  Name: [name] MRN: [id number] Date of Birth: 12/23/1945  Today's Date: 11/30/2020 PT Individual Time: 1102-1215 PT Individual Time Calculation (min): 73 min   Short Term Goals: Week 4:  PT Short Term Goal 1 (Week 4): STG=LTG due to ELOS  Skilled Therapeutic Interventions/Progress Updates:    Patient in supine after OT due to some incontinence during session.  Performed supine to sit with min A using rail.  Daughter present throughout session for caregiver education.  Educated in w/c positioning, slide board positioning/placement, hand placement and hip/head relationship and caregiver positioning.  Performed SBT with min A and daughter performed back to bed then to w/c with mod cues about hand placement, pt's forward weight shift and board placement with increased time and pt still not forward enough.    Daughter brought her car up for practice car transfer to her car.  Performed slide board transfer to daughter's car with mod to max A and increased time with daughter watching.  Discussed likely not safe for daughter to assist with car transfer at this time due to level of assist needed and her difficulty with bed/chair transfers today.  Patient assisted back to w/c from car with mod A and max cues.  Patient assisted to unit in w/c.    Daughter returned and pt performed sit to stand to [place] with mod A and ambulated 6' to bed with min A with PT assist.  Daughter assisted to stand from EOB with increased time to [place] and initiated ambulation until pt's knee buckled, then assisted to stand with mod/max A while daughter assisted to bring w/c.  Patient lowered to w/c and assisted back for safety.  Discussed not to ambulate with pt unless HHPT there to assist.  Patient transferred to bed via slide board with daughter assist and min cues of PT.  Patient sit to supine with daughter assist for legs.    Discussed likely need to return tomorrow for more  practice.  SW informed need for ambulance transport home and discussed equipment delivery. Patient left in supine with lunch set up and daughter in the room.   Therapy Documentation Precautions:  Precautions Precautions: Fall Precaution Comments: L UE fistula with upper arm wound and pain Restrictions Weight Bearing Restrictions: No    Therapy/Group: Individual Therapy  Reginia Naas Magda Kiel, PT 11/30/2020, 8:56 AM

## 2020-11-30 NOTE — Progress Notes (Signed)
Occupational Therapy Session Note  Patient Details  Name: [name] MRN: [id number] Date of Birth: 03-16-1946  Today's Date: 11/30/2020 OT Individual Time: 1300-1415 OT Individual Time Calculation (min): 75 min    Short Term Goals: Week 4:  OT Short Term Goal 1 (Week 4): Pt will complete LB bathing/dressing mod A with AE and LRAD. OT Short Term Goal 2 (Week 4): Patient will complete 2/3 toileting tasks mod A using LRAD/AE PRN. OT Short Term Goal 3 (Week 4): Patient will complete toilet transfer with mod A using LRAD. OT Short Term Goal 4 (Week 4): Patient will complete UB bathing/dressing with set up A from w/c or EOB using AE PRN. OT Short Term Goal 5 (Week 4): Patient will complete dynamic standing ADL task with min A using LRAD.  Skilled Therapeutic Interventions/Progress Updates:    Patient in bed, alert and ready for therapy session.  She notes mild pain in left hand.  Supine to sitting edge of bed with CS and increased time.  SB transfer bed to w/c with mod A.  SPT to nustep with mod A (fear of falling)  completed 10 minutes on nustep using bilateral arms and legs at level 5.  SB transfer back to w/c due to patient preference with mod A.  W/c push ups and upper body strengthening activities with min difficulty.  Completed hand dexterity and Dexter activities - adequate pinch in left hand to take knots out of ace wrap.  Pill box activity with minimal difficulty.  Reviewed desensitization and safety with impaired sensation - good awareness and carryover noted.  SB transfer w/c to bed with min A.  Sit to supine max A.   She remained in bed at close of session, bed alarm set, call bell and tray table in reach.    Therapy Documentation Precautions:  Precautions Precautions: Fall Precaution Comments: L UE fistula with upper arm wound and pain Restrictions Weight Bearing Restrictions: No   Therapy/Group: Individual Therapy  Carlos Levering 11/30/2020, 7:30 AM

## 2020-11-30 NOTE — Progress Notes (Signed)
Occupational Therapy Session Note  Patient Details  Name: [name] MRN: [id number] Date of Birth: 10-02-1945  Today's Date: 11/30/2020 OT Individual Time: 1001-1100 OT Individual Time Calculation (min): 59 min    Short Term Goals: Week 4:  OT Short Term Goal 1 (Week 4): Pt will complete LB bathing/dressing mod A with AE and LRAD. OT Short Term Goal 2 (Week 4): Patient will complete 2/3 toileting tasks mod A using LRAD/AE PRN. OT Short Term Goal 3 (Week 4): Patient will complete toilet transfer with mod A using LRAD. OT Short Term Goal 4 (Week 4): Patient will complete UB bathing/dressing with set up A from w/c or EOB using AE PRN. OT Short Term Goal 5 (Week 4): Patient will complete dynamic standing ADL task with min A using LRAD.  Skilled Therapeutic Interventions/Progress Updates:    Pt received supine in bed with dtr present, agreeable to OT session. Pt and family education provided on bed mobility, positioning and body mechanics for pt and family member, home set up, DME recommendations, ADL recommendations, and home safety/fall prevention. Pt and family demo'd good understanding and asked questions - eager to learn safe techniques for safe d/c home. Donned ted hose and shoes with total A. Sup>sit min A; sit>sup mod A to manage BLEs. UB dressing set up A sitting EOB. LB dressing sitting EOB mod-max A; pt able to thread pants on BLEs from seated position with vc's and use of reacher. With standing trials in attempt to pull pants over hips in stance, pt unable to stand due to fatigue using platform walker. Pt completed sit<>stand mod A in [place] and max A to pull pants over hips. Pt and family education on recommendation of completing LB dressing bed level at d/c due to weakness and fatigue when standing. Pt incontinent of bowels following standing and toileting completed bed level with total A. RN entered to check bed sore site and provided clean dressing. Min A to roll L and R without using  bed rails. Dtr completed rolling and sup<>sit transfers following education with vc's and demo'd good understanding of safe body mechanics and handling. Pt remained supine in bed, dtr present, with PT session imminent and all needs met.   Therapy Documentation Precautions:  Precautions Precautions: Fall Precaution Comments: L UE fistula with upper arm wound and pain Restrictions Weight Bearing Restrictions: No  Pain: Pain Assessment Pain Scale: 0-10 Pain Score: 0-No pain    Therapy/Group: Individual Therapy  [name] 11/30/2020, 4:30 PM

## 2020-11-30 NOTE — Progress Notes (Signed)
Patient ID: [name], female   DOB: 17-Aug-1945, 75 y.o.   MRN: [id number] Follow up with patient regarding pending discharge. Patient noted she feels ready to go and family education scheduled for this morning with her daughter. Questions regarding equipment; aware of W/C order but concerned about the PLRW and slideboard. Follow up with the SW to confirm equipment orders. Also asking about communication with the dialysis center that she will be returning on Thursday at her usual time via scat. Continue to follow up after family education to confirm readiness for discharge. Margarito Liner

## 2020-11-30 NOTE — Progress Notes (Signed)
Patient ID: [name], female   DOB: 1945/12/11, 75 y.o.   MRN: [id number]  Met with pt and daughter who was here for education. It went well but car transfer was too difficult and plan now is to go home non-emergency ambulance on Wed. Equipment to be delivered to the home and home health arranged. Work toward discharge Wed.

## 2020-12-01 LAB — CBC
HCT: 40.3 % (ref 36.0–46.0)
Hemoglobin: 12.7 g/dL (ref 12.0–15.0)
MCH: 31.7 pg (ref 26.0–34.0)
MCHC: 31.5 g/dL (ref 30.0–36.0)
MCV: 100.5 fL — ABNORMAL HIGH (ref 80.0–100.0)
Platelets: 262 10*3/uL (ref 150–400)
RBC: 4.01 MIL/uL (ref 3.87–5.11)
RDW: 17.2 % — ABNORMAL HIGH (ref 11.5–15.5)
WBC: 9.3 10*3/uL (ref 4.0–10.5)
nRBC: 0 % (ref 0.0–0.2)

## 2020-12-01 LAB — RENAL FUNCTION PANEL
Albumin: 2 g/dL — ABNORMAL LOW (ref 3.5–5.0)
Anion gap: 12 (ref 5–15)
BUN: 36 mg/dL — ABNORMAL HIGH (ref 8–23)
CO2: 25 mmol/L (ref 22–32)
Calcium: 8.5 mg/dL — ABNORMAL LOW (ref 8.9–10.3)
Chloride: 97 mmol/L — ABNORMAL LOW (ref 98–111)
Creatinine, Ser: 5.1 mg/dL — ABNORMAL HIGH (ref 0.44–1.00)
GFR, Estimated: 8 mL/min — ABNORMAL LOW (ref >60)
Glucose, Bld: 77 mg/dL (ref 70–99)
Phosphorus: 4.4 mg/dL (ref 2.5–4.6)
Potassium: 4.3 mmol/L (ref 3.5–5.1)
Sodium: 134 mmol/L — ABNORMAL LOW (ref 135–145)

## 2020-12-01 MED ORDER — HEPARIN SODIUM (PORCINE) 1000 UNIT/ML IJ SOLN
INTRAMUSCULAR | Status: AC
  Administered 2020-12-01: 3200 [IU]
  Filled 2020-12-01: qty 4

## 2020-12-01 MED ORDER — ACETAMINOPHEN 325 MG PO TABS
ORAL_TABLET | ORAL | Status: AC
  Filled 2020-12-01: qty 2

## 2020-12-01 MED ORDER — PENTAFLUOROPROP-TETRAFLUOROETH EX AERO: 1.0000 "application " | INHALATION_SPRAY | CUTANEOUS | Status: DC | PRN

## 2020-12-01 MED ORDER — ALTEPLASE 2 MG IJ SOLR: 2.0000 mg | Freq: Once | INTRAMUSCULAR | Status: DC | PRN

## 2020-12-01 MED ORDER — SODIUM CHLORIDE 0.9 % IV SOLN: 100.0000 mL | INTRAVENOUS | Status: DC | PRN

## 2020-12-01 MED ORDER — LIDOCAINE HCL (PF) 1 % IJ SOLN: 5.0000 mL | INTRAMUSCULAR | Status: DC | PRN

## 2020-12-01 MED ORDER — LIDOCAINE-PRILOCAINE 2.5-2.5 % EX CREA: 1.0000 "application " | TOPICAL_CREAM | CUTANEOUS | Status: DC | PRN

## 2020-12-01 NOTE — Progress Notes (Signed)
Nutrition Follow-up  DOCUMENTATION CODES:  Not applicable  INTERVENTION:  Discontinue Nepro shakes BID.  Add Magic cup (orange) TID with meals, each supplement provides 290 kcal and 9 grams of protein.  Continue Rena-Vite daily.  NUTRITION DIAGNOSIS:  Increased nutrient needs related to chronic illness (ESRD HD) as evidenced by estimated needs. - ongoing  GOAL:  Patient will meet greater than or equal to 90% of their needs - not meeting  MONITOR:  PO intake, Supplement acceptance, Skin, Weight trends, Labs, I & O's  REASON FOR ASSESSMENT:  Malnutrition Screening Tool    ASSESSMENT:  75 year old female with history of ESRD- HD TTS, HTN, PAF, glaucoma, OA, aortic valve stenosis, initially presents with large intramuscular biceps hematoma with concerns of compartment syndrome and was taken to OR on 10/27/20 for evacuation of hematoma and primary repair of left brachial artery pseudoaneurysm. Therapy ongoing and patient noted to have deficits due to LUE pain as well as  BLE weakness with instability. Pt with functional deficits due to myopathy/B/L foot drop. CIR due to functional decline.  Spoke with pt and daughter at bedside. Pt reports that her bagel was not toasted this morning for breakfast, but other than that, she has no complaints. She reports that the Nepro shakes make her full and not want to eat meals. Per Epic, pt's meal intakes mostly range from 50-80%.  RD to discontinue Nepro shakes and try Magic Cup TID (orange) to combat early satiety at meals.  UOP: 0 ml/24 hrs  UDW: 71 kg Current wt: 74.6 kg Admit wt: 74.5 kg  Medications: reviewed; Pepcid, Nepro shakes BID, Rena-Vite, FiberCon BID  Labs: reviewed; Na 134  Diet Order:   Diet Order             Diet renal with fluid restriction Fluid restriction: 1200 mL Fluid; Room service appropriate? Yes; Fluid consistency: Thin  Diet effective now                  EDUCATION NEEDS:  Education needs have been  addressed  Skin:  Skin Assessment: Reviewed RN Assessment  Last BM:  12/01/20 - Type 4, small  Height:  Ht Readings from Last 1 Encounters:  10/26/20 5\' 3"  (1.6 m)   Weight:  Wt Readings from Last 1 Encounters:  12/01/20 74.6 kg   BMI:  Body mass index is 29.13 kg/m.  Estimated Nutritional Needs:  Kcal:  1850-2050 Protein:  85-100 grams Fluid:  1.2 L/day  Derrel Nip, RD, LDN Registered Dietitian I After-Hours/Weekend Pager # in [place]

## 2020-12-01 NOTE — Progress Notes (Signed)
Physical Therapy Session Note  Patient Details  Name: [name] MRN: [id number] Date of Birth: 31-Dec-1945  Today's Date: 12/01/2020 PT Individual Time:Session1: 9390-3009; Arthor Captain: [id number] PT Individual Time Calculation (min): 60 min &68 min  Short Term Goals: Week 4:  PT Short Term Goal 1 (Week 4): STG=LTG due to ELOS  Skilled Therapeutic Interventions/Progress Updates:    Session1: Patient in supine and reports only mild pain L UE.  Donned TED stockings in supine total A.  Patient supine to sit with min A and donned pants in sitting with min A for threading feet in using reacher, then max A for pulling up pants in standing with mod A for sit to stand to [place].  Patient seated to doff then don shirt with CGA.  Donned shoes with mod A.  Patient standing to step to w/c with PRFW and min A.  At sink for oral hygiene and combing hair with S.  Patient propelled w/c x 30' with S and increased time.  Patient seated for strength and sensory assessment.  Sit to stand to [place] for ambulation x 18' with min to mod A.  Issued HEP for LE strength and reviewed with pt including bridging, lateral trunk rotation, hooklying hip flexion, hooklying hip abduction clamshell with theraband, seated LAQ and sit <>stand at counter.  Patient verbalized understanding of all.  Patient stand step to w/c using PFRW with mod A.  Patient assisted in w/c to room and left sitting with daughter in the room and needs within reach.  Session2: Patient in w/c with daughter in the room.  Discussed items reviewed with OT.  Showed daughter HEP and placed in notebook for home.  Daughter completed set up and transfer via slide board for pt to bed at higher setting to simulate close to bed height at home.  Did continue education about cueing pt for foot and hand placement as well as continued cues for anterior weight shifts.  She assisted pt back to w/c with appropriate cues and assist.  Patient assisted in w/c to ortho gym.  Performed  slide board transfer to/from car at simulated sedan height with mod A, increased time.  Patient propelled w/c up and down ramp with min A.  Patient propelled w/c x another 48' with S.  Continued education with daughter regarding w/c management for transport, pt's ability to propel w/c herself and for keeping track of issues with mobility to practice or problem solve with HHPT.  Patient assisted to room w/c and daughter performed slide board transfer assisting from reverse as well as from in front.  She assisted with sit to supine at mod A level.  Patient scooted to [facility] with bed in trendelenberg and using rails with S.  Left in supine with daughter in the room and needs in reach.   Therapy Documentation Precautions:  Precautions Precautions: Fall Precaution Comments: L UE fistula and upper arm healing incision Restrictions Weight Bearing Restrictions: No  Pain: Pain Assessment Pain Scale: 0-10 Pain Score: 6  Pain Type: Acute pain Pain Location: Hand Pain Orientation: Left Pain Descriptors / Indicators: Sharp;Stabbing Pain Intervention(s): Medication (See eMAR)    Therapy/Group: Individual Therapy  Reginia Naas Magda Kiel, PT 12/01/2020, 12:36 PM

## 2020-12-01 NOTE — Progress Notes (Addendum)
Occupational Therapy Discharge Summary  Patient Details  Name: [name] MRN: [id number] Date of Birth: [birth year]  Today's Date: 12/01/2020 OT Individual Time: 8101-7510 OT Individual Time Calculation (min): 58 min    Patient has met 3 of 13 long term goals due to improved activity tolerance, improved balance, postural control, functional use of  LEFT upper extremity, and improved coordination. 4 of 10 LTGs that were unmet were set at 'adequate for d/c' level due to pt's variability in fatigue and endurance levels, often requiring intermittent increased physical assistance to complete safely. Patient to discharge at overall Mod Assist and w/c level.  Patient's care partner is independent to provide the necessary physical assistance at discharge.    Reasons goals not met: Patient continues to demo decreased strength in BLEs and LUE, continuing to need greater assistance with functional transfers and inconsistent with assistance needed to complete LB adls depending on fatigue levels and position.   Recommendation:  Patient will benefit from ongoing skilled OT services in home health setting to continue to advance functional skills in the area of BADL and Reduce care partner burden. Pt would also benefit from [facility] to advance functional skills in LUE due to sensory, strength, and [place] deficits.  Equipment: BSC, slide board, reacher  Reasons for discharge: discharge from hospital  Patient/family agrees with progress made and goals achieved: Yes  OT Discharge Precautions/Restrictions  Precautions Precautions: Fall Precaution Comments: L UE fistula and upper arm healing incision Restrictions Weight Bearing Restrictions: No  Pain Pain Assessment Pain Scale: 0-10 Pain Score:  (unrated) Pain Type: Acute pain Pain Location: Hand Pain Orientation: Left Pain Descriptors / Indicators: Aching Pain Onset: On-going Pain Intervention(s): Distraction;Rest Multiple Pain Sites:  No ADL ADL Equipment Provided: Reacher Eating: Set up Where Assessed-Eating: Wheelchair, Chair Grooming: Setup Where Assessed-Grooming: Sitting at sink, Wheelchair Upper Body Bathing: Setup Where Assessed-Upper Body Bathing: Edge of bed Lower Body Bathing: Moderate assistance Where Assessed-Lower Body Bathing: Bed level, Edge of bed Upper Body Dressing: Setup Where Assessed-Upper Body Dressing: Edge of bed, Wheelchair Lower Body Dressing: Maximal assistance Where Assessed-Lower Body Dressing: Edge of bed, Wheelchair, Other (Comment) (in standing using Stedy or PFRW) Toileting: Dependent Where Assessed-Toileting: Bed level Toilet Transfer: Moderate assistance Toilet Transfer Method: Stand pivot, Other (comment) (slide board) Toilet Transfer Equipment: Bedside commode, Drop arm bedside commode Tub/Shower Transfer: Not assessed (pt not cleared for showering due to HD port) Gaffer Transfer: Not assessed Vision Baseline Vision/History: No visual deficits Patient Visual Report: No change from baseline Vision Assessment?: No apparent visual deficits Perception  Perception: Within Functional Limits Praxis Praxis: Intact Cognition Overall Cognitive Status: Within Functional Limits for tasks assessed Arousal/Alertness: Awake/alert Orientation Level: Oriented X4 Attention: Sustained;Selective Sustained Attention: Appears intact Selective Attention: Appears intact Memory: Appears intact Awareness: Appears intact Problem Solving: Appears intact Executive Function: Reasoning Reasoning: Appears intact Safety/Judgment: Appears intact Sensation Sensation Light Touch: Impaired Detail Peripheral sensation comments: decreased L digits 1-3 and lateral half of palm; decreased sharp/dull; numbness ongoing Light Touch Impaired Details: Impaired LUE Hot/Cold: Impaired by gross assessment Proprioception: Appears Intact Stereognosis: Impaired by gross assessment Additional Comments:  Numbness in digits 1-3 Coordination Gross Motor Movements are Fluid and Coordinated: No Fine Motor Movements are Fluid and Coordinated: No Coordination and Movement Description: L UE decreased due to continued weakness and numbness, decreased LE's due to pre-existing weakness 9 Hole Peg Test: R: 30.5 seconds; L: 1 min 10.6 seconds Motor  Motor Motor: Abnormal postural alignment and control Motor - Discharge Observations:  generalized weakness, poor postural control continues with postural deformities in spine Mobility  Bed Mobility Bed Mobility: Supine to Sit;Rolling Right;Rolling Left;Sit to Supine Rolling Right: Minimal Assistance - Patient > 75% Rolling Left: Minimal Assistance - Patient > 75% Supine to Sit: Minimal Assistance - Patient > 75% Sit to Supine: Minimal Assistance - Patient > 75%;Moderate Assistance - Patient 50-74% Transfers Sit to Stand: Moderate Assistance - Patient 50-74% Stand to Sit: Moderate Assistance - Patient 50-74%  Trunk/Postural Assessment  Cervical Assessment Cervical Assessment: Exceptions to [facility] Thoracic Assessment Thoracic Assessment: Exceptions to [facility] (slight kyphosis and rounded shoulders) Lumbar Assessment Lumbar Assessment: Exceptions to [facility] (posterior pelvic tilt) Postural Control Postural Control: Deficits on evaluation Postural Limitations: core weakness but improved sitting balance from eval  Balance Balance Balance Assessed: Yes Static Sitting Balance Static Sitting - Balance Support: No upper extremity supported;Feet supported Static Sitting - Level of Assistance: 5: Stand by assistance Dynamic Sitting Balance Dynamic Sitting - Balance Support: Feet supported;During functional activity;No upper extremity supported Dynamic Sitting - Level of Assistance: 5: Stand by assistance Dynamic Sitting - Balance Activities: Lateral lean/weight shifting;Forward lean/weight shifting;Reaching for objects;Reaching across midline Static Standing  Balance Static Standing - Balance Support: During functional activity;Bilateral upper extremity supported Static Standing - Level of Assistance: 3: Mod assist Static Standing - Comment/# of Minutes: with L platform walker or in Stedy to complete LB dressing in stance Dynamic Standing Balance Dynamic Standing - Balance Support: During functional activity Dynamic Standing - Level of Assistance: 4: Min assist Dynamic Standing - Balance Activities: Lateral lean/weight shifting;Forward lean/weight shifting;Reaching for objects;Other (comment);Reaching across midline (BITS) Dynamic Standing - Comments:  (using BITS reaching outside BOS with PFRW) Extremity/Trunk Assessment RUE Assessment RUE Assessment: Within Functional Limits General Strength Comments: Improved strength from eval, but 4/5 LUE Assessment LUE Assessment: Exceptions to [facility] Active Range of Motion (AROM) Comments: improved AROM in LUE since eval - WFL General Strength Comments: Strength improved since eval but continues to demo weakness 3+ or 4/5  Skilled Therapeutic Intervention: Pt received seated in w/c with dtr present for continuation of family education. Pt reporting unrated pain in LUE but tolerable throughout session. Pt demo'd slide board transfer with CGA w/c>therapy mat; caregiver education for transfer training provided using slide board and dtr demo'd appropriate min A slide board transfer mat>w/c. VC's for pt body mechanics, anterior weight shift, and caregiver handling techniques/body mechanics with good carryover. On mat, pt engaged in parts of HEP to demo to dtr; provided education and discussion of home set up options and activities to complete in bed and while seated to improve posture and maintain ROM in preparation for functional mobility and BADLs. Pt reported skin tear on LUE following attempted sit<>stand using platform RW; provided soap/water to wash area and band-aid. In sit<>stand trials with platform walker, pt  unable to come to full stance due to R knee buckling and fatigue from earlier PT session. Education on energy conservation strategies and transfer options when pt is fatigued at home. Provided LUE HEP with theraputty and handouts to improve [facility] and strength in LUE; education on sensitization techniques for home as well. Pt remained seated in w/c, all needs met, dtr present, and call bell in reach.   [name] 12/01/2020, 4:59 PM

## 2020-12-01 NOTE — Progress Notes (Signed)
[facility] Progress Note   Subjective: She is going home tomorrow and is very excited. HD today on schedule.   Objective Vitals:   11/30/20 0443 11/30/20 1442 11/30/20 1954 12/01/20 0342  BP: (!) 116/47 (!) 158/57 (!) 161/59 (!) 146/57  Pulse: 63 70 74 69  Resp: 17 17 17 20   Temp: 98 F (36.7 C) 98.4 F (36.9 C) 97.6 F (36.4 C) 97.9 F (36.6 C)  TempSrc:  Oral Oral Oral  SpO2: 96% 98% 97% 98%  Weight: 74.6 kg   78.5 kg   Physical Exam General: pleasant elderly female in NAD Heart: N0,N3 3/6 systolic M No R/G Lungs: CTAB Abdomen: Active BS Extremities: No LE edema.  Dialysis Access: RIJ TDC Drsg intact. L AVF +T/B    Additional Objective Labs: Basic Metabolic Panel: Recent Labs  Lab 11/24/20 1400 11/26/20 1357 11/28/20 1330  NA 136 134* 133*  K 3.9 3.4* 4.0  CL 101 100 99  CO2 26 26 25   GLUCOSE 116* 119* 123*  BUN 37* 16 29*  CREATININE 6.01* 3.20* 4.78*  CALCIUM 8.0* 7.6* 7.6*  PHOS 3.4 2.0* 3.4   Liver Function Tests: Recent Labs  Lab 11/24/20 1400 11/26/20 1357 11/28/20 1330  ALBUMIN 1.6* 1.8* 1.6*   No results for input(s): LIPASE, AMYLASE in the last 168 hours. CBC: Recent Labs  Lab 11/24/20 1400 11/26/20 1357 11/28/20 1330  WBC 8.8 8.6 8.9  HGB 10.2* 11.2* 10.8*  HCT 33.0* 36.1 34.1*  MCV 101.5* 101.1* 101.2*  PLT 252 217 232   Blood Culture    Component Value Date/Time   SDES URINE, RANDOM 07/17/2020 1500   SPECREQUEST  07/17/2020 1500    NONE Performed at [facility], [place] [address] [place],  [address]    CULT MULTIPLE SPECIES PRESENT, SUGGEST RECOLLECTION (A) 07/17/2020 1500   REPTSTATUS 07/18/2020 FINAL 07/17/2020 1500    Cardiac Enzymes: No results for input(s): CKTOTAL, CKMB, CKMBINDEX, TROPONINI in the last 168 hours. CBG: No results for input(s): GLUCAP in the last 168 hours. Iron Studies: No results for input(s): IRON, TIBC, TRANSFERRIN, FERRITIN in the last 72  hours. @lablastinr3 @ Studies/Results: No results found. Medications:  sodium chloride     sodium chloride      amiodarone  200 mg Oral Daily   apixaban  2.5 mg Oral BID   cephALEXin  250 mg Oral q1800   Chlorhexidine Gluconate Cloth  6 each Topical Q0600   famotidine  20 mg Oral Daily   feeding supplement (NEPRO CARB STEADY)  237 mL Oral BID BM   gabapentin  300 mg Oral QHS   hydrocortisone   Rectal BID   hydrocortisone  25 mg Rectal BID   latanoprost  1 drop Both Eyes QHS   lidocaine  1 patch Transdermal Q24H   lidocaine-prilocaine  1 application Topical Once per day on Mon Wed Fri   multivitamin  1 tablet Oral QHS   polycarbophil  625 mg Oral BID AC   saccharomyces boulardii  250 mg Oral BID   timolol  1 drop Both Eyes Daily     Dialysis Orders: [facility] TTS 4h  400/500 71kg 2/2 bath P2  Hep none RIJ TDC (AVF works but infiltrated and not using) -Mircera 75 q 2 wks (last 5/5) No VDRA    Assessment/Plan: LUE compartment syndrome: evacuation of hematoma/ repair of bleeding brachial artery pseudoaneurysm on 5/10 per Dr. Felipa Eth. Pain control per PMD. Now with pain/numbness, difficulty using L hand. Seen by  [facility] 11/25/20. No evidence of steal. Will need time for symptoms to resolve. No intervention needed. Reported new swelling of L forearm during dialysis 6/9, resolved with ice/rest. Did not reoccur with HD yesterday. Unsure of etiology, continue to monitor.  ESRD -  HD TTS. Next HD 12/01/20.  Hypertension/volume  - BP mostly well controlled. No BP lowering meds at home or here currently. UF as tolerated to get weights down and have encouraged fluid restriction. Weights all over the place the last few days.  Need standing weight to better assess.  Continue lowering volume as tolerated.  Anemia of CKD/ABLA   - Hgb 10.8.  ESA on hold. Nadir Hgb 6.5 since admission, s/p 2 unit prbcs 5/11.  Metabolic bone disease -  CCa ok, No VDRA.  phos has been in range without binders. Encourage  nutrition.   Chronic combined CHF -- volume removal with UF on HD PAFib- On Eliquis/amiodarone  Nutrition - Renal diet/fluid restriction.  Alb 1.6. Encourage nutrition. Prot supp for low albumin Debility - now in CIR. DC home tomorrow.     Lyndia Bury H. Lashawne Dura NP-C 12/01/2020, 1:09 PM  [employer] [phone]

## 2020-12-01 NOTE — Progress Notes (Signed)
Patient ID: [name], female   DOB: 09/24/1945, 75 y.o.   MRN: [id number]  Met with pt and daughter who was here earlier to discuss discharge tomorrow. Both want ambulance set up for 10:00 so can be home at a decent time. [employer] to contact daughter regarding equipment delivery, was suppose to yesterday and did not. Pt feels ready to go home and updated regarding team conference 

## 2020-12-01 NOTE — Progress Notes (Addendum)
Pt return to 4W02 per bed. Vitals obtained, no complications noted. Sheela Stack, LPN

## 2020-12-01 NOTE — Plan of Care (Signed)
  Problem: RH Balance Goal: LTG Patient will maintain dynamic sitting balance (PT) Description: LTG:  Patient will maintain dynamic sitting balance with assistance during mobility activities (PT) Outcome: Completed/Met Goal: LTG Patient will maintain dynamic standing balance (PT) Description: LTG:  Patient will maintain dynamic standing balance with assistance during mobility activities (PT) Outcome: Not Met (add Reason) Note: Needs min to mod A for dynamic standing tasks   Problem: Sit to Stand Goal: LTG:  Patient will perform sit to stand with assistance level (PT) Description: LTG:  Patient will perform sit to stand with assistance level (PT) Outcome: Not Met (add Reason) Note: Min to mod A for sit to stand due to LE weakness   Problem: RH Bed Mobility Goal: LTG Patient will perform bed mobility with assist (PT) Description: LTG: Patient will perform bed mobility with assistance, with/without cues (PT). Outcome: Not Met (add Reason) Note: Needs min A for supine to sit and mod A for LE's into bed sit to supine   Problem: RH Bed to Chair Transfers Goal: LTG Patient will perform bed/chair transfers w/assist (PT) Description: LTG: Patient will perform bed to chair transfers with assistance (PT). Outcome: Not Met (add Reason) Note: Still needing min A for slide board transfers due to weakness   Problem: RH Car Transfers Goal: LTG Patient will perform car transfers with assist (PT) Description: LTG: Patient will perform car transfers with assistance (PT). Outcome: Not Met (add Reason) Note: Needs mod A for car transfers due to weakness   Problem: RH Ambulation Goal: LTG Patient will ambulate in controlled environment (PT) Description: LTG: Patient will ambulate in a controlled environment, # of feet with assistance (PT). Outcome: Not Met (add Reason) Note: Has ambulated up to 25' with min A but not consistent, at times needs mod A.  Goal: LTG Patient will ambulate in home  environment (PT) Description: LTG: Patient will ambulate in home environment, # of feet with assistance (PT). Outcome: Not Met (add Reason) Note: Has ambulated up to 25' with min A with PFRW, but not consistent   Problem: RH Wheelchair Mobility Goal: LTG Patient will propel w/c in controlled environment (PT) Description: LTG: Patient will propel wheelchair in controlled environment, # of feet with assist (PT) Outcome: Completed/Met  Magda Kiel, PT

## 2020-12-01 NOTE — Progress Notes (Signed)
[facility] PHYSICAL MEDICINE & REHABILITATION PROGRESS NOTE  Subjective/Complaints:  Pt happy that LUE is less painful with lidocaine- also less swelling/bruising.    ROS:   Pt denies SOB, abd pain, CP, N/V/C/D, and vision changes   Objective: Vital Signs: Blood pressure (!) 146/57, pulse 69, temperature 97.9 F (36.6 C), temperature source Oral, resp. rate 20, weight 78.5 kg, last menstrual period 01/19/1992, SpO2 98 %. No results found. Recent Labs    11/28/20 1330  WBC 8.9  HGB 10.8*  HCT 34.1*  PLT 232   Recent Labs    11/28/20 1330  NA 133*  K 4.0  CL 99  CO2 25  GLUCOSE 123*  BUN 29*  CREATININE 4.78*  CALCIUM 7.6*    Intake/Output Summary (Last 24 hours) at 12/01/2020 1028 Last data filed at 12/01/2020 0729 Gross per 24 hour  Intake 360 ml  Output --  Net 360 ml        Physical Exam: BP (!) 146/57 (BP Location: Right Arm)   Pulse 69   Temp 97.9 F (36.6 C) (Oral)   Resp 20   Wt 78.5 kg   LMP 01/19/1992 (Approximate)   SpO2 98%   BMI 30.66 kg/m     General: awake, alert, appropriate, laying in bed- LUE elevated on pillow. NAD HENT: conjugate gaze; oropharynx moist CV: regular rate; no JVD Pulmonary: CTA B/L; no W/R/R- good air movement GI: soft, NT, ND, (+)BS Psychiatric: appropriate- flat affect Neurological: alert  Ext: no clubbing, cyanosis, minimal /trace LUE edema- with bruising that's more yellow- so healing well- is elevating LUE on pillow Skin: Warm and dry.  L upper arm incision CDI. Residual bruising decreased.  Right chest wall catheter CDI Musc: LUE still tender, sl swelling Left lower extreme edema trace Neuro: Alert Motor: RUE: 4-/5 proximal to distal LUE: 3+/5 proximal to distal--stable B/l LE: HF 3/5, KE, ADF 4/5  Assessment/Plan: 1. Functional deficits which require 3+ hours per day of interdisciplinary therapy in a comprehensive inpatient rehab setting. Physiatrist is providing close team supervision and 24 hour  management of active medical problems listed below. Physiatrist and rehab team continue to assess barriers to discharge/monitor patient progress toward functional and medical goals   Care Tool:  Bathing    Body parts bathed by patient: Left arm, Abdomen, Chest, Right upper leg, Left upper leg, Face, Right lower leg   Body parts bathed by helper: Right arm, Left lower leg, Buttocks, Front perineal area     Bathing assist Assist Level: Moderate Assistance - Patient 50 - 74%     Upper Body Dressing/Undressing Upper body dressing   What is the patient wearing?: Pull over shirt    Upper body assist Assist Level: Set up assist    Lower Body Dressing/Undressing Lower body dressing      What is the patient wearing?: Pants     Lower body assist Assist for lower body dressing: Maximal Assistance - Patient 25 - 49%     Toileting Toileting    Toileting assist Assist for toileting: Total Assistance - Patient < 25%     Transfers Chair/bed transfer  Transfers assist  Chair/bed transfer activity did not occur:  (sliding board)  Chair/bed transfer assist level: Minimal Assistance - Patient > 75% Chair/bed transfer assistive device: Walker (platform walker)   Locomotion Ambulation   Ambulation assist   Ambulation activity did not occur: Safety/medical concerns  Assist level: 2 helpers Assistive device: Walker-platform Max distance: 23'   Walk 10 feet  activity   Assist  Walk 10 feet activity did not occur: Safety/medical concerns  Assist level: 2 helpers Assistive device: Walker-platform   Walk 50 feet activity   Assist Walk 50 feet with 2 turns activity did not occur: Safety/medical concerns         Walk 150 feet activity   Assist Walk 150 feet activity did not occur: Safety/medical concerns         Walk 10 feet on uneven surface  activity   Assist Walk 10 feet on uneven surfaces activity did not occur: Safety/medical concerns          Wheelchair     Assist Will patient use wheelchair at discharge?: Yes Type of Wheelchair: Manual    Wheelchair assist level: Minimal Assistance - Patient > 75% Max wheelchair distance: 50    Wheelchair 50 feet with 2 turns activity    Assist        Assist Level: Minimal Assistance - Patient > 75%   Wheelchair 150 feet activity     Assist      Assist Level: Dependent - Patient 0%   BP (!) 146/57 (BP Location: Right Arm)   Pulse 69   Temp 97.9 F (36.6 C) (Oral)   Resp 20   Wt 78.5 kg   LMP 01/19/1992 (Approximate)   SpO2 98%   BMI 30.66 kg/m   Medical Problem List and Plan: 1.  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  Antithrombotics: -DVT/anticoagulation:  Pharmaceutical: Other (comment)--renal dose Eliquis.              -antiplatelet therapy: N/A 3. Pain Management: Continue Oxycodone prn for severe pain. Tramadol added prn for moderate pain.              on low dose gabapentin for neuropathic pain. Add lidocaine patch to left shoulder 4. Mood: LCSW to follow for evaluation and support.              -antipsychotic agents: N/A 5. Neuropsych: This patient is capable of making decisions on her own behalf. 6. Skin/Wound Care:  Routine pressure relief measures.  7. Fluids/Electrolytes/Nutrition: Strict I/Os with daily weights. Renal diet/1200 cc FR.  8. Hematoma LUE s/p evacuation: Continue to monitor pulse and for symptoms. Also elevate LUE 9. Acute on chronic anemia: Improved post transfusion. Continue aransep weekly.   6/14- Hb 10.8- overall stable- con't regimen 10. PAF: Continue amiodarone and Eliquis.   Controlled on 6/13, monitor TID    11.  ESRD: HD on TTS after therapy to help with tolerance of therapy.  12. Chronic UTI:   Continue Keflex  for suppression 13. B/L foot drop- Prevalon boots for moderate B/L foot drop  6/7 reinforcing use of boots for skin/heel cord stretch 14. Leukocytosis  WBCs down to 8.6 6/9  Afebrile    15.  Loose stools  Fiber added on 5/20   Improved, stools formed 16. Labile blood pressure  6/11- BP slightly elevated- con't to monitor for trend 17. Hemorrhoids.     rectal cream and suppository BID which have helped   -has tucks pad  6/14- getting better- can go home with some if need be.  18. Dispo  6/14- d/c tomorrow-      LOS: 28 days A FACE TO FACE EVALUATION WAS PERFORMED  Archana Eckman 12/01/2020, 10:28  AM

## 2020-12-01 NOTE — Plan of Care (Signed)
Problem: RH Balance Goal: LTG: Patient will maintain dynamic sitting balance (OT) Description: LTG:  Patient will maintain dynamic sitting balance with assistance during activities of daily living (OT) Outcome: Adequate for Discharge   Problem: RH Eating Goal: LTG Patient will perform eating w/assist, cues/equip (OT) Description: LTG: Patient will perform eating with assist, with/without cues using equipment (OT) Outcome: Adequate for Discharge   Problem: RH Grooming Goal: LTG Patient will perform grooming w/assist,cues/equip (OT) Description: LTG: Patient will perform grooming with assist, with/without cues using equipment (OT) Outcome: Adequate for Discharge Note: From w/c level, pt is mod I for grooming, but set up A is preferred at d/c due to fatigue and endurance deficits.   Problem: RH Toilet Transfers Goal: LTG Patient will perform toilet transfers w/assist (OT) Description: LTG: Patient will perform toilet transfers with assist, with/without cues using equipment (OT) Outcome: Adequate for Discharge   Problem: RH Balance Goal: LTG Patient will maintain dynamic standing with ADLs (OT) Description: LTG:  Patient will maintain dynamic standing balance with assist during activities of daily living (OT)  Outcome: Not Met (add Reason) Note: Pt has completed dynamic standing tasks with min A for balance, but inconsistently at d/c and would benefit from increased A for safety at home.   Problem: Sit to Stand Goal: LTG:  Patient will perform sit to stand in prep for activites of daily living with assistance level (OT) Description: LTG:  Patient will perform sit to stand in prep for activites of daily living with assistance level (OT) Outcome: Not Met (add Reason) Note: Pt able to complete sit<>stand min A, but inconsistently. Often requires mod A or max A due to fatigue and BLE weakness.   Problem: RH Bathing Goal: LTG Patient will bathe all body parts with assist levels  (OT) Description: LTG: Patient will bathe all body parts with assist levels (OT) Outcome: Not Met (add Reason) Note: At d/c, pt completing bathing with occasional mod-max A for LB from seated or bed level.   Problem: RH Dressing Goal: LTG Patient will perform lower body dressing w/assist (OT) Description: LTG: Patient will perform lower body dressing with assist, with/without cues in positioning using equipment (OT) Outcome: Not Met (add Reason) Note: LB dressing at d/c often mod-max A either bed level or in stance with AD.   Problem: RH Toileting Goal: LTG Patient will perform toileting task (3/3 steps) with assistance level (OT) Description: LTG: Patient will perform toileting task (3/3 steps) with assistance level (OT)  Outcome: Not Met (add Reason) Note: Pt continues to often require total A for toileting bed level due to incontinence.    Problem: RH Dressing Goal: LTG Patient will perform upper body dressing (OT) Description: LTG Patient will perform upper body dressing with assist, with/without cues (OT). Outcome: Completed/Met   Problem: RH Functional Use of Upper Extremity Goal: LTG Patient will use RT/LT upper extremity as a (OT) Description: LTG: Patient will use right/left upper extremity as a stabilizer/gross assist/diminished/nondominant/dominant level with assist, with/without cues during functional activity (OT) Outcome: Completed/Met   Problem: RH Memory Goal: LTG Patient will demonstrate ability for day to day recall/carry over during activities of daily living with assistance level (OT) Description: LTG:  Patient will demonstrate ability for day to day recall/carry over during activities of daily living with assistance level (OT). Outcome: Completed/Met   Problem: RH Tub/Shower Transfers Goal: LTG Patient will perform tub/shower transfers w/assist (OT) Description: LTG: Patient will perform tub/shower transfers with assist, with/without cues using equipment  (OT) Outcome:  Not Applicable

## 2020-12-01 NOTE — Progress Notes (Signed)
Physical Therapy Discharge Summary  Patient Details  Name: [name] MRN: [id number] Date of Birth: 1945/10/23  Patient has met 2 of 9 long term goals due to improved balance, increased strength, decreased pain, and ability to compensate for deficits.  Patient to discharge at a wheelchair level [place].   Patient's care partner is independent to provide the necessary physical assistance at discharge.  Reasons goals not met: Patient with decreased strength in LE's and still needing more help with transfers and not consistent with ambulation with min A.   Recommendation:  Patient will benefit from ongoing skilled PT services in home health setting to continue to advance safe functional mobility, address ongoing impairments in balance, LE strength, standing endurance, and minimize fall risk.  Equipment: Wheelchair, cushion, slide board and platform for walker  Reasons for discharge: discharge from hospital  Patient/family agrees with progress made and goals achieved: Yes  PT Discharge Precautions/Restrictions Precautions Precautions: Fall Precaution Comments: L UE fistula and upper arm healing incision  Pain Pain Assessment Pain Scale: 0-10 Pain Score: 5  Pain Type: Acute pain Pain Location: Arm Pain Orientation: Left;Anterior Pain Descriptors / Indicators: Aching Pain Intervention(s): Distraction;Ambulation/increased activity Vision/Perception  Perception Perception: Within Functional Limits Praxis Praxis: Intact  Cognition Overall Cognitive Status: Within Functional Limits for tasks assessed Arousal/Alertness: Awake/alert Orientation Level: Oriented X4 Memory: Appears intact Safety/Judgment: Appears intact Sensation Sensation Light Touch: Impaired Detail Peripheral sensation comments: decreased L lateral 3 digits and lateral half of palm Light Touch Impaired Details: Impaired LUE Hot/Cold: Impaired by gross assessment Proprioception: Appears  Intact Stereognosis: Impaired by gross assessment Additional Comments: Pt with numbness in the index, middle digit, and thumb Coordination Gross Motor Movements are Fluid and Coordinated: No Fine Motor Movements are Fluid and Coordinated: No Coordination and Movement Description: L UE decreased due to continued weakness and numbness, decreased LE's due to pre-existing weakness Motor  Motor Motor: Abnormal postural alignment and control Motor - Discharge Observations: generalized weakness, poor postural control continues with postural deformities in spine  Mobility Bed Mobility Bed Mobility: Supine to Sit;Rolling Right;Rolling Left;Sit to Supine Rolling Right: Minimal Assistance - Patient > 75% Rolling Left: Minimal Assistance - Patient > 75% Supine to Sit: Minimal Assistance - Patient > 75% Sit to Supine: Moderate Assistance - Patient 50-74% Transfers Transfers: Sit to Stand;Stand to [employer];Lateral/Scoot Transfers Sit to Stand: Moderate Assistance - Patient 50-74% Stand to Sit: Minimal Assistance - Patient > 75% Stand Pivot Transfers: Moderate Assistance - Patient 50 - 74%;Minimal Assistance - Patient > 75% Stand Pivot Transfer Details: Verbal cues for technique;Verbal cues for safe use of DME/AE Lateral/Scoot Transfers: Minimal Assistance - Patient > 75% Transfer (Assistive device): Left platform walker Locomotion  Gait Ambulation: Yes Gait Assistance: Moderate Assistance - Patient 50-74%;Minimal Assistance - Patient > 75% Gait Distance (Feet): 18 Feet Assistive device: Left platform walker Gait Assistance Details: Verbal cues for precautions/safety;Verbal cues for safe use of DME/AE Gait Gait: Yes Gait Pattern: Trunk flexed;Lateral hip instability;Decreased hip/knee flexion - right;Decreased hip/knee flexion - left;Step-to pattern Stairs / Additional Locomotion Stairs: No Ramp: Minimal Assistance - Patient >75% (in w/c) Product manager  Mobility: Yes Wheelchair Assistance: Chartered loss adjuster: Both upper extremities Wheelchair Parts Management: Needs assistance Distance: 65'  Trunk/Postural Assessment  Cervical Assessment Cervical Assessment: Exceptions to [facility] (forward head) Thoracic Assessment Thoracic Assessment: Exceptions to [facility] (kyphosis, rounded shoulders and scoliosis) Lumbar Assessment Lumbar Assessment: Exceptions to [facility] (posterior pelvic tilt) Postural Control Postural Control: Deficits on evaluation Postural  Limitations: core weakness but improved sitting balance from eval  Balance Balance Balance Assessed: Yes Static Sitting Balance Static Sitting - Balance Support: No upper extremity supported;Feet supported Static Sitting - Level of Assistance: 5: Stand by assistance Dynamic Sitting Balance Dynamic Sitting - Balance Support: Feet supported;During functional activity;No upper extremity supported Dynamic Sitting - Level of Assistance: 5: Stand by assistance Dynamic Sitting - Balance Activities: Lateral lean/weight shifting;Forward lean/weight shifting;Reaching for objects;Reaching across midline Static Standing Balance Static Standing - Balance Support: During functional activity;Bilateral upper extremity supported Static Standing - Level of Assistance: 4: Min assist Static Standing - Comment/# of Minutes: standing with L PFRW Dynamic Standing Balance Dynamic Standing - Balance Support: During functional activity Dynamic Standing - Level of Assistance: 4: Min assist Dynamic Standing - Balance Activities: Lateral lean/weight shifting Dynamic Standing - Comments:  (using BITS reaching outside BOS with PFRW) Extremity Assessment      RLE Assessment RLE Assessment: Exceptions to [facility] Passive Range of Motion (PROM) Comments: WFL Active Range of Motion (AROM) Comments: Limited due to weakness General Strength Comments: hip flexion 2+/5, knee extension 4-/5, ankle DF 4-/5 LLE  Assessment LLE Assessment: Exceptions to [facility] Passive Range of Motion (PROM) Comments: WFL Active Range of Motion (AROM) Comments: limited due to weakness General Strength Comments: hip flexion 2+/5, knee extension 4/5, ankle DF 4-/5    Jamison Oka, PT 12/01/2020, 9:12 AM

## 2020-12-02 ENCOUNTER — Other Ambulatory Visit: Payer: Self-pay

## 2020-12-02 DIAGNOSIS — N186 End stage renal disease: Secondary | ICD-10-CM | POA: Diagnosis not present

## 2020-12-02 DIAGNOSIS — J9601 Acute respiratory failure with hypoxia: Secondary | ICD-10-CM

## 2020-12-02 DIAGNOSIS — I129 Hypertensive chronic kidney disease with stage 1 through stage 4 chronic kidney disease, or unspecified chronic kidney disease: Secondary | ICD-10-CM | POA: Diagnosis not present

## 2020-12-02 DIAGNOSIS — I480 Paroxysmal atrial fibrillation: Secondary | ICD-10-CM

## 2020-12-02 DIAGNOSIS — I4891 Unspecified atrial fibrillation: Secondary | ICD-10-CM

## 2020-12-02 DIAGNOSIS — Z992 Dependence on renal dialysis: Secondary | ICD-10-CM | POA: Diagnosis not present

## 2020-12-02 MED ORDER — ACETAMINOPHEN 325 MG PO TABS: 325.0000 mg | ORAL_TABLET | ORAL | Status: DC | PRN

## 2020-12-02 MED ORDER — LIDOCAINE 5 % EX PTCH: 1.0000 | MEDICATED_PATCH | CUTANEOUS | 0 refills | Status: DC

## 2020-12-02 MED ORDER — LORAZEPAM 0.5 MG PO TABS: 0.5000 mg | ORAL_TABLET | Freq: Every day | ORAL | 0 refills | Status: DC | PRN

## 2020-12-02 MED ORDER — CALCIUM POLYCARBOPHIL 625 MG PO TABS: 625.0000 mg | ORAL_TABLET | Freq: Two times a day (BID) | ORAL | 0 refills | Status: DC

## 2020-12-02 MED ORDER — HYDROCORTISONE (PERIANAL) 2.5 % EX CREA: TOPICAL_CREAM | Freq: Two times a day (BID) | CUTANEOUS | 0 refills | Status: DC

## 2020-12-02 MED ORDER — GABAPENTIN 600 MG PO TABS: 300.0000 mg | ORAL_TABLET | Freq: Every day | ORAL | 0 refills | Status: DC

## 2020-12-02 NOTE — Discharge Summary (Signed)
Physician Discharge Summary  Patient ID: [name] MRN: [id number] DOB/AGE: 1946/02/25 75 y.o.  Admit date: 11/03/2020 Discharge date: 12/02/2020  Discharge Diagnoses:  Principal Problem:   Intensive care (ICU) myopathy Active Problems:   Debility   ESRD on dialysis ([place])   Acute on chronic anemia   Post-operative pain   Chronic UTI   Loose stools   Labile blood pressure   Current mild episode of major depressive disorder ([place])   Discharged Condition: good  Significant Diagnostic Studies: No results found.  Labs:  Basic Metabolic Panel: Recent Labs  Lab 11/26/20 1357 11/28/20 1330 12/01/20 1219  NA 134* 133* 134*  K 3.4* 4.0 4.3  CL 100 99 97*  CO2 26 25 25   GLUCOSE 119* 123* 77  BUN 16 29* 36*  CREATININE 3.20* 4.78* 5.10*  CALCIUM 7.6* 7.6* 8.5*  PHOS 2.0* 3.4 4.4    CBC: Recent Labs  Lab 11/26/20 1357 11/28/20 1330 12/01/20 1219  WBC 8.6 8.9 9.3  HGB 11.2* 10.8* 12.7  HCT 36.1 34.1* 40.3  MCV 101.1* 101.2* 100.5*  PLT 217 232 262    CBG: No results for input(s): GLUCAP in the last 168 hours.  Brief HPI:   [name] is a 75 y.o. female with history of ESRD, HTN, PAF, aortic valve stenosis with DOE, hospitalizations in the past year with history of ICU myopathy with foot drop; who was admitted on 10/26/2020 with large intramuscular biceps hematoma with concerns of compartment syndrome after recent infiltration of AV fistula.  She was taken to the OR on 05/10 for evacuation of hematoma and primary repair of left brachial artery pseudoaneurysm by Dr. Donzetta Matters.  Postprocedure numbness almost resolved and acute on chronic anemia treated with 2 units PRBC.  Weekly Aranesp added for supplementation.  She continues to be limited by fatigue, nausea, acute on chronic diarrhea as well as LUE pain.  Therapies were ongoing and patient was noted to have deficits due to BLE weakness with instability.  CIR was recommended due to functional decline.   Hospital  Course: [name] was admitted to rehab 11/03/2020 for inpatient therapies to consist of PT and OT at least three hours five days a week. Past admission physiatrist, therapy team and rehab RN have worked together to provide customized collaborative inpatient rehab.  Low-dose gabapentin was added to help manage neuropathic pain and tramadol added as needed for moderate pain.  She was maintained on Keflex for UTI suppression.  Blood pressures been controlled overall.  No meds use to help low volume status as tolerated.  Prevalon boots ordered due to moderate BLE foot drop.  Nutritional supplements were ordered to help with low calorie malnutrition.  FiberCon was added to help manage chronic diarrhea and she reports this has been effective with bulking stools.    Her pain control is improved and she is using Tylenol as needed for pain management.  She continues to have some numbness in left upper extremity.  She did have some transient increase in edema left forearm after hemodialysis on 06/09 which resolved with ice and rest.  Overall, edema and ecchymosis of LUE has greatly resolved.  Follow-up CBC shows H&H gradually improving and reactive leukocytosis has resolved.  Heart rate has been controlled on current dose of amiodarone.  She has made steady but slow and consistent gains during her stay.She continues to be limited by fatigue and fluctuating levels of endurance affecting amount of assistance.  She continued requires mod assist at wheelchair  level.  She will continue receive follow-up home health PT, OT, RN and aide by [place] after discharge.   Rehab course: During patient's stay in rehab weekly team conferences were held to monitor patient's progress, set goals and discuss barriers to discharge. At admission, patient required total assist with basic ADL tasks and with mobility. She  has had improvement in activity tolerance, balance, postural control as well as ability to compensate  for deficits.  She required set up assist with UBc care and mod to max sist with LB care.  She requires min to mod assist for transfers and is able to ambulate 15 feet with min assist and use of left platform walker.  She is able to propel her wheelchair with supervision. Family education was completed with daughter will provide assistance as needed after discharge.   Discharge disposition: 01-Home or Self Care  Diet: Renal diet.  1200cc FR/day  Special Instructions:  1. Keep incision clean and dry.    Allergies as of 12/02/2020       Reactions   Astemizole Nausea And Vomiting   Fluorouracil Rash   Vicodin [hydrocodone-acetaminophen] Nausea And Vomiting   Chlorhexidine Rash   Sunburn    rash   Percocet [oxycodone-acetaminophen] Nausea And Vomiting        Medication List     STOP taking these medications    feeding supplement (NEPRO CARB STEADY) Liqd       TAKE these medications    acetaminophen 325 MG tablet Commonly known as: TYLENOL Take 1-2 tablets (325-650 mg total) by mouth every 4 (four) hours as needed for mild pain.   amiodarone 200 MG tablet Commonly known as: PACERONE Take 1 tablet (200 mg total) by mouth daily.   apixaban 2.5 MG Tabs tablet Commonly known as: Eliquis Take 1 tablet (2.5 mg total) by mouth 2 (two) times daily.   bimatoprost 0.01 % Soln Commonly known as: LUMIGAN Place 1 drop into both eyes at bedtime.   cephALEXin 250 MG capsule Commonly known as: KEFLEX Take 250 mg by mouth daily.   famotidine 20 MG tablet Commonly known as: PEPCID Take 1 tablet (20 mg total) by mouth 2 (two) times daily.   gabapentin 600 MG tablet Commonly known as: NEURONTIN Take 0.5 tablets (300 mg total) by mouth at bedtime.   hydrocortisone 2.5 % rectal cream Commonly known as: ANUSOL-HC Place rectally 2 (two) times daily.   lidocaine 5 % Commonly known as: LIDODERM Place 1 patch onto the skin daily. Apply to left shoulder at 8 am and remove at 8 pm  daily. Purchase over the counter if insurance does not cover it.   lidocaine-prilocaine cream Commonly known as: EMLA Apply 1 application topically 3 (three) times a week.   loperamide 2 MG tablet Commonly known as: IMODIUM A-D Take 2 mg by mouth 4 (four) times daily as needed for diarrhea or loose stools.   LORazepam 0.5 MG tablet Commonly known as: ATIVAN Take 1 tablet (0.5 mg total) by mouth daily as needed for anxiety (or panic attack).   multivitamin Tabs tablet Take 1 tablet by mouth at bedtime.   ondansetron 4 MG tablet Commonly known as: ZOFRAN Take 4 mg by mouth every 8 (eight) hours as needed for nausea or vomiting.   polycarbophil 625 MG tablet Commonly known as: FIBERCON Take 1 tablet (625 mg total) by mouth 2 (two) times daily before lunch and supper.   saccharomyces boulardii 250 MG capsule Commonly known as: [employer]  Take 1 capsule (250 mg total) by mouth 2 (two) times daily.   timolol 0.5 % ophthalmic solution Commonly known as: BETIMOL Place 1 drop into both eyes daily.        Follow-up Information     Lajean Manes, MD Follow up.   Specialty: Internal Medicine Contact information: [address] [place] [address] [phone]         Jettie Booze, MD .   Specialties: Cardiology, Radiology, Interventional Cardiology Contact information: [address] [place] [address] [phone]         Meredith Staggers, MD. Call.   Specialty: Physical Medicine and Rehabilitation Why: As needed Contact information: [address] [place] [address] [phone]         Waynetta Sandy, MD. Call.   Specialties: Vascular Surgery, Cardiology Why: for follow up appointment Contact information: [place] [address] [phone]                 Signed: Bary Leriche 12/02/2020, 10:30 AM

## 2020-12-02 NOTE — Patient Care Conference (Signed)
Inpatient RehabilitationTeam Conference and Plan of Care Update Date: 12/02/2020   Time: 12:17 PM    Patient Name: [name]      Medical Record Number: [id number]  Date of Birth: [birth year] Sex: Female         Room/Bed: 4W02C/4W02C-01 Payor Info: Payor: [place] / Plan: BCBS MEDICARE / Product Type: *No Product type* /    Admit Date/Time:  11/03/2020  1:38 PM  Primary Diagnosis:  Intensive care (ICU) myopathy  Hospital Problems: Principal Problem:   Intensive care (ICU) myopathy Active Problems:   Debility   ESRD on dialysis ([place])   Acute on chronic anemia   Post-operative pain   Chronic UTI   Loose stools   Labile blood pressure   Current mild episode of major depressive disorder [facility])    Expected Discharge Date: Expected Discharge Date: 12/02/20  Team Members Present: Physician leading conference: Dr. Courtney Heys Care Coodinator Present: Dorien Chihuahua, RN, BSN, CRRN;Becky Dupree, LCSW Nurse Present: Dorien Chihuahua, RN PT Present: Magda Kiel, PT OT Present: Other (comment) Providence Lanius, OT) [place] Coordinator present : Ileana Ladd, PT     Current Status/Progress Goal Weekly Team Focus  Bowel/Bladder   Continent bladder.incontinent of Bowel, Dialysis T/TH/Sat, LBM 11/30/20  Patient will restore normal function of B/B  Continue to assess QS/PRN toileting needs   Swallow/Nutrition/ Hydration             ADL's   min-mod A bed mobility and slide board transfers; sit<>stand mod-max A; UB adl set up/CS, LB adl mod-max A, toileting dependent  min A  adl training, transfer training, family education, upright tolerance and position changes, LUE AROM/FMC, DME   Mobility   min A to mod A slide board transfers, mod A sit to stand, Gait at times up to 25'min to mod A w/ PFRW, w/c mobility  CGA/min A w/c level  Caregiver education, slide board practice, HEP education, standing endurance   Communication             Safety/Cognition/ Behavioral  Observations            Pain   LUE pain rating pain from 5-8/10 on pain scale, continue PRN medications, alternating meds  pain <3/10  Assess QS/PRN with follow up documentation, elevate left hand /arm on pillows   Skin   LUE surgical incision healing well open to air, no drainage or irritation, tenderness remain, sacral dressing intact and changed prn  promote healing and prevention of infection.  Assess Q/PRN, repostion and turn q 2 hrs     Discharge Planning:  Daughter was in for education yesterday and car transfer was difficult will need to go home via ambulance for DC   Team Discussion: Patient is all set for discharge  Patient on target to meet rehab goals: yes, currently mod assist - total assist for lower body care @ bed level for safety. Min assist for upper body care. Completing slide-board transfers. Standing is limited by fatigue.   *See Care Plan and progress notes for long and short-term goals.   Revisions to Treatment Plan:  Do not recommend ambulation at discharge except with [facility] therapy   Teaching Needs: Transfers, toileting, medications, etc.   Current Barriers to Discharge: Decreased caregiver support, Home enviroment access/layout, and Hemodialysis  Possible Resolutions to Barriers: Family education completed with the daughter HEP given to patient and daughter to continue to practice  [facility] follow up services recommended     Medical Summary  Current Status: incontinent B/B- skin tear L forearm; bruising LUE- improving;  Barriers to Discharge: Decreased family/caregiver support;Hemodialysis;Weight bearing restrictions;Wound care  Barriers to Discharge Comments: d/c tomorrow by ambulance- 10am- needs family education- severe ICU myopathy- still cannot stand easily; Possible Resolutions to [employer] Focus: focus- family ed and LUE elevation with pillow- con't pain regimen- d/c tomorrow 12/02/20- no ambulation without H/H and got HEP.   Continued Need for  Acute Rehabilitation Level of Care: The patient requires daily medical management by a physician with specialized training in physical medicine and rehabilitation for the following reasons: Direction of a multidisciplinary physical rehabilitation program to maximize functional independence : Yes Medical management of patient stability for increased activity during participation in an intensive rehabilitation regime.: Yes Analysis of laboratory values and/or radiology reports with any subsequent need for medication adjustment and/or medical intervention. : Yes   I attest that I was present, lead the team conference, and concur with the assessment and plan of the team.   Dorien Chihuahua B 12/02/2020, 1:07 PM

## 2020-12-02 NOTE — Progress Notes (Signed)
Inpatient Rehabilitation Care Coordinator Discharge Note  The overall goal for the admission was met for:   Discharge location: Yes-HOME WITH DAUGHTER WHO PROVIDED CARE PRIOR TO ADMISSION  Length of Stay: Yes-29 DAYS  Discharge activity level: Yes-MIN ASSIST LEVEL  Home/community participation: Yes  Services provided included: MD, RD, PT, OT, RN, CM, Pharmacy, Neuropsych, and SW  Financial Services: Private Insurance: [employer] MEDICARE  Choices offered to/list presented to:PT AND DAUGHTER  Follow-up services arranged: Home Health: [facility]-PT,OT, DME: [facility]-PLATFORM RW, WC,TRANSFER BOARD, and Patient/Family request agency HH: ACTIVE PT WITH , DME: NOPREF  Comments (or additional information):DAUGHTER CAME IN FOR EDUCATION AND WENT WELL AWARE WILL NEED 24/7 CARE  Patient/Family verbalized understanding of follow-up arrangements: Yes  Individual responsible for coordination of the follow-up plan: [facility] [phone]  Confirmed correct DME delivered: Elease Hashimoto 12/02/2020    Jarl Sellitto, [place]

## 2020-12-02 NOTE — Progress Notes (Signed)
[place] PHYSICAL MEDICINE & REHABILITATION PROGRESS NOTE  Subjective/Complaints:  Pt ready for d/c today- no complaints- asked when leaving- let her know transport is at 10am.   ROS:   Pt denies SOB, abd pain, CP, N/V/C/D, and vision changes   Objective: Vital Signs: Blood pressure 128/61, pulse 68, temperature 98 F (36.7 C), resp. rate 16, weight 73.1 kg, last menstrual period 01/19/1992, SpO2 97 %. No results found. Recent Labs    12/01/20 1219  WBC 9.3  HGB 12.7  HCT 40.3  PLT 262   Recent Labs    12/01/20 1219  NA 134*  K 4.3  CL 97*  CO2 25  GLUCOSE 77  BUN 36*  CREATININE 5.10*  CALCIUM 8.5*    Intake/Output Summary (Last 24 hours) at 12/02/2020 2706 Last data filed at 12/01/2020 1711 Gross per 24 hour  Intake 300 ml  Output 2500 ml  Net -2200 ml        Physical Exam: BP 128/61 (BP Location: Right Arm)   Pulse 68   Temp 98 F (36.7 C)   Resp 16   Wt 73.1 kg   LMP 01/19/1992 (Approximate)   SpO2 97%   BMI 28.55 kg/m      General: awake, alert, appropriate, elevating LUE on pillow; NAD HENT: conjugate gaze; oropharynx moist CV: regular rate; no JVD Pulmonary: CTA B/L; no W/R/R- good air movement GI: soft, NT, ND, (+)BS Psychiatric: appropriate; flat affect Neurological: alert  Ext: no clubbing, cyanosis, minimal /trace LUE edema- with bruising that's more yellow- so healing well- is elevating LUE on pillow- no change today Skin: Warm and dry.  L upper arm incision CDI. Residual bruising decreased.  Right chest wall catheter CDI Musc: LUE still tender, sl swelling Left lower extreme edema trace Neuro: Alert Motor: RUE: 4-/5 proximal to distal LUE: 3+/5 proximal to distal--stable B/l LE: HF 3/5, KE, ADF 4/5  Assessment/Plan: 1. Functional deficits which require 3+ hours per day of interdisciplinary therapy in a comprehensive inpatient rehab setting. Physiatrist is providing close team supervision and 24 hour management of active  medical problems listed below. Physiatrist and rehab team continue to assess barriers to discharge/monitor patient progress toward functional and medical goals   Care Tool:  Bathing    Body parts bathed by patient: Left arm, Abdomen, Chest, Right upper leg, Left upper leg, Face, Right lower leg, Right arm   Body parts bathed by helper: Front perineal area, Buttocks, Left lower leg     Bathing assist Assist Level: Moderate Assistance - Patient 50 - 74%     Upper Body Dressing/Undressing Upper body dressing   What is the patient wearing?: Pull over shirt    Upper body assist Assist Level: Set up assist    Lower Body Dressing/Undressing Lower body dressing      What is the patient wearing?: Pants, Incontinence brief     Lower body assist Assist for lower body dressing: Maximal Assistance - Patient 25 - 49%     Toileting Toileting    Toileting assist Assist for toileting: Total Assistance - Patient < 25%     Transfers Chair/bed transfer  Transfers assist  Chair/bed transfer activity did not occur:  (sliding board)  Chair/bed transfer assist level: Minimal Assistance - Patient > 75% Chair/bed transfer assistive device: Sliding board   Locomotion Ambulation   Ambulation assist   Ambulation activity did not occur: Safety/medical concerns  Assist level: Moderate Assistance - Patient 50 - 74% Assistive device: Walker-platform Max distance:  18   Walk 10 feet activity   Assist  Walk 10 feet activity did not occur: Safety/medical concerns  Assist level: Moderate Assistance - Patient - 50 - 74% Assistive device: Walker-platform   Walk 50 feet activity   Assist Walk 50 feet with 2 turns activity did not occur: Safety/medical concerns         Walk 150 feet activity   Assist Walk 150 feet activity did not occur: Safety/medical concerns         Walk 10 feet on uneven surface  activity   Assist Walk 10 feet on uneven surfaces activity did not  occur: Safety/medical concerns         Wheelchair     Assist Will patient use wheelchair at discharge?: Yes Type of Wheelchair: Manual    Wheelchair assist level: Supervision/Verbal cueing Max wheelchair distance: 87'    Wheelchair 50 feet with 2 turns activity    Assist        Assist Level: Supervision/Verbal cueing   Wheelchair 150 feet activity     Assist      Assist Level: Dependent - Patient 0%   BP 128/61 (BP Location: Right Arm)   Pulse 68   Temp 98 F (36.7 C)   Resp 16   Wt 73.1 kg   LMP 01/19/1992 (Approximate)   SpO2 97%   BMI 28.55 kg/m   Medical Problem List and Plan: 1.  ICU myopathy with B/L foot drop/LE>UE weakness secondary to prolonged hospitalization/recent hospitalization for LUE thrombus s/p thrombectomy    -Continue PT and OT/CIR for overall strengthening.endurance  -con't PT and OT/CIR- d/c tomorrow  -d/c today- transport at 10am per SW 2.  Antithrombotics: -DVT/anticoagulation:  Pharmaceutical: Other (comment)--renal dose Eliquis.              -antiplatelet therapy: N/A 3. Pain Management: Continue Oxycodone prn for severe pain. Tramadol added prn for moderate pain.              on low dose gabapentin for neuropathic pain. Add lidocaine patch to left shoulder 4. Mood: LCSW to follow for evaluation and support.              -antipsychotic agents: N/A 5. Neuropsych: This patient is capable of making decisions on her own behalf. 6. Skin/Wound Care:  Routine pressure relief measures.  7. Fluids/Electrolytes/Nutrition: Strict I/Os with daily weights. Renal diet/1200 cc FR.  8. Hematoma LUE s/p evacuation: Continue to monitor pulse and for symptoms. Also elevate LUE 9. Acute on chronic anemia: Improved post transfusion. Continue aransep weekly.   6/14- Hb 10.8- overall stable- con't regimen 10. PAF: Continue amiodarone and Eliquis.   Controlled on 6/13, monitor TID    11.  ESRD: HD on TTS after therapy to help with tolerance  of therapy.  12. Chronic UTI:   Continue Keflex for suppression 13. B/L foot drop- Prevalon boots for moderate B/L foot drop  6/7 reinforcing use of boots for skin/heel cord stretch 14. Leukocytosis  WBCs down to 8.6 6/9  Afebrile    15.  Loose stools  Fiber added on 5/20   Improved, stools formed 16. Labile blood pressure  6/11- BP slightly elevated- con't to monitor for trend 17. Hemorrhoids.     rectal cream and suppository BID which have helped   -has tucks pad  6/14- getting better- can go home with some if need be.   6/15- says it's better- con't regimen 18. Dispo  6/14- d/c tomorrow-   6/15-  d/c today at 10am- by transport     LOS: 29 days A FACE TO [place] 12/02/2020, 8:21 AM

## 2020-12-02 NOTE — Patient Outreach (Signed)
[facility]) Care Management  12/02/2020  KARYSSA AMARAL 15-Aug-1945 [id number]  Extreme high risk score for unplanned readmission  [facility]  Provide additional complex disease management care and support for plan.

## 2020-12-03 ENCOUNTER — Telehealth: Payer: Self-pay | Admitting: Nurse Practitioner

## 2020-12-03 ENCOUNTER — Encounter: Payer: Self-pay | Admitting: Registered Nurse

## 2020-12-03 ENCOUNTER — Other Ambulatory Visit: Payer: Self-pay | Admitting: *Deleted

## 2020-12-03 DIAGNOSIS — N2581 Secondary hyperparathyroidism of renal origin: Secondary | ICD-10-CM | POA: Diagnosis not present

## 2020-12-03 DIAGNOSIS — L89152 Pressure ulcer of sacral region, stage 2: Secondary | ICD-10-CM | POA: Diagnosis not present

## 2020-12-03 DIAGNOSIS — R197 Diarrhea, unspecified: Secondary | ICD-10-CM | POA: Diagnosis not present

## 2020-12-03 DIAGNOSIS — R519 Headache, unspecified: Secondary | ICD-10-CM | POA: Diagnosis not present

## 2020-12-03 DIAGNOSIS — Z992 Dependence on renal dialysis: Secondary | ICD-10-CM | POA: Diagnosis not present

## 2020-12-03 DIAGNOSIS — I482 Chronic atrial fibrillation, unspecified: Secondary | ICD-10-CM | POA: Diagnosis not present

## 2020-12-03 DIAGNOSIS — G7281 Critical illness myopathy: Secondary | ICD-10-CM | POA: Diagnosis not present

## 2020-12-03 DIAGNOSIS — D631 Anemia in chronic kidney disease: Secondary | ICD-10-CM | POA: Diagnosis not present

## 2020-12-03 DIAGNOSIS — R5381 Other malaise: Secondary | ICD-10-CM | POA: Diagnosis not present

## 2020-12-03 DIAGNOSIS — D688 Other specified coagulation defects: Secondary | ICD-10-CM | POA: Diagnosis not present

## 2020-12-03 DIAGNOSIS — N186 End stage renal disease: Secondary | ICD-10-CM | POA: Diagnosis not present

## 2020-12-03 DIAGNOSIS — E039 Hypothyroidism, unspecified: Secondary | ICD-10-CM | POA: Diagnosis not present

## 2020-12-03 DIAGNOSIS — Z5181 Encounter for therapeutic drug level monitoring: Secondary | ICD-10-CM | POA: Diagnosis not present

## 2020-12-03 NOTE — Patient Outreach (Signed)
[facility]) Care Management  12/03/2020  JOVON STREETMAN September 16, 1945 [id number]   Referral Date: 6/15 Referral Source: Hospital liaison  Referral Reason: Post hospital discharge Insurance: [place] attempt #1, unsuccessful, HIPAA compliant voice message left.   Plan: RN CM will send outreach letter and follow up within the next 3-4 business days.  Valente David, [place], MSN [place] [phone]

## 2020-12-03 NOTE — Telephone Encounter (Signed)
Transition of care contact from inpatient facility  Date of Discharge:12/02/2020 Date of Contact:12/03/2020 Method of contact: Phone  Attempted to contact patient to discuss transition of care from inpatient admission. Patient did not answer the phone. Message was left on the patient's voicemail with call back number [phone].

## 2020-12-04 ENCOUNTER — Telehealth: Payer: Self-pay

## 2020-12-04 NOTE — Telephone Encounter (Signed)
Transitional Care call--Michelle-daughter    Are you/is patient experiencing any problems since coming home? Are there any questions regarding any aspect of care? Are there any questions regarding medications administration/dosing? Are meds being taken as prescribed? Patient should review meds with caller to confirm Have there been any falls? Has Home Health been to the house and/or have they contacted you? No If not, have you tried to contact them? Yes Can we help you contact them? Are bowels and bladder emptying properly? Are there any unexpected incontinence issues? If applicable, is patient following bowel/bladder programs? Any fevers, problems with breathing, unexpected pain? Are there any skin problems or new areas of breakdown? Has the patient/family member arranged specialty MD follow up (ie cardiology/neurology/renal/surgical/etc)?  Can we help arrange? Does the patient need any other services or support that we can help arrange? Are caregivers following through as expected in assisting the patient? Has the patient quit smoking, drinking alcohol, or using drugs as recommended?  Appointment time, arrive time and who it is with here [place] to daughter and she states Dr. Darlina Rumpf was writing [place] orders previous to surgery and she thought he would continue. That is all I was able to talk to daughter about before the phone call dropped. Tried calling daughter back and no answer, I left message. I told daughter to let us know if Dr. Harmon Pier continues to write those orders. I called [facility] and they have not received referral yet.

## 2020-12-04 NOTE — Telephone Encounter (Signed)
Transition Care Management Unsuccessful Follow-up Telephone Call  Date of discharge and from where:  12/02/20 from [facility]  Attempts:  1st Attempt  Reason for unsuccessful TCM follow-up call:  Left voice message for daughter [name]

## 2020-12-05 DIAGNOSIS — R519 Headache, unspecified: Secondary | ICD-10-CM | POA: Diagnosis not present

## 2020-12-05 DIAGNOSIS — D631 Anemia in chronic kidney disease: Secondary | ICD-10-CM | POA: Diagnosis not present

## 2020-12-05 DIAGNOSIS — N2581 Secondary hyperparathyroidism of renal origin: Secondary | ICD-10-CM | POA: Diagnosis not present

## 2020-12-05 DIAGNOSIS — D688 Other specified coagulation defects: Secondary | ICD-10-CM | POA: Diagnosis not present

## 2020-12-05 DIAGNOSIS — Z992 Dependence on renal dialysis: Secondary | ICD-10-CM | POA: Diagnosis not present

## 2020-12-05 DIAGNOSIS — N186 End stage renal disease: Secondary | ICD-10-CM | POA: Diagnosis not present

## 2020-12-05 DIAGNOSIS — R197 Diarrhea, unspecified: Secondary | ICD-10-CM | POA: Diagnosis not present

## 2020-12-05 DIAGNOSIS — E039 Hypothyroidism, unspecified: Secondary | ICD-10-CM | POA: Diagnosis not present

## 2020-12-05 DIAGNOSIS — I482 Chronic atrial fibrillation, unspecified: Secondary | ICD-10-CM | POA: Diagnosis not present

## 2020-12-05 DIAGNOSIS — Z5181 Encounter for therapeutic drug level monitoring: Secondary | ICD-10-CM | POA: Diagnosis not present

## 2020-12-08 DIAGNOSIS — R519 Headache, unspecified: Secondary | ICD-10-CM | POA: Diagnosis not present

## 2020-12-08 DIAGNOSIS — E875 Hyperkalemia: Secondary | ICD-10-CM | POA: Diagnosis not present

## 2020-12-08 DIAGNOSIS — D688 Other specified coagulation defects: Secondary | ICD-10-CM | POA: Diagnosis not present

## 2020-12-08 DIAGNOSIS — N2581 Secondary hyperparathyroidism of renal origin: Secondary | ICD-10-CM | POA: Diagnosis not present

## 2020-12-08 DIAGNOSIS — N186 End stage renal disease: Secondary | ICD-10-CM | POA: Diagnosis not present

## 2020-12-08 DIAGNOSIS — R197 Diarrhea, unspecified: Secondary | ICD-10-CM | POA: Diagnosis not present

## 2020-12-08 DIAGNOSIS — Z992 Dependence on renal dialysis: Secondary | ICD-10-CM | POA: Diagnosis not present

## 2020-12-09 ENCOUNTER — Other Ambulatory Visit: Payer: Self-pay | Admitting: *Deleted

## 2020-12-09 NOTE — Patient Outreach (Signed)
[facility]) Care Management  12/09/2020  SERRIA SLOMA 09/12/1945 [id number]   Referral Date: 6/15 Referral Source: Hospital liaison    Referral Reason: Post hospital discharge Insurance: [place] attempt #2, unsuccessful, HIPAA compliant voice message left.  Will follow up within the next 3-4 business days.  Valente David, [place], MSN [place] [phone]

## 2020-12-10 DIAGNOSIS — Z992 Dependence on renal dialysis: Secondary | ICD-10-CM | POA: Diagnosis not present

## 2020-12-10 DIAGNOSIS — R197 Diarrhea, unspecified: Secondary | ICD-10-CM | POA: Diagnosis not present

## 2020-12-10 DIAGNOSIS — N186 End stage renal disease: Secondary | ICD-10-CM | POA: Diagnosis not present

## 2020-12-10 DIAGNOSIS — N2581 Secondary hyperparathyroidism of renal origin: Secondary | ICD-10-CM | POA: Diagnosis not present

## 2020-12-10 DIAGNOSIS — E875 Hyperkalemia: Secondary | ICD-10-CM | POA: Diagnosis not present

## 2020-12-10 DIAGNOSIS — D688 Other specified coagulation defects: Secondary | ICD-10-CM | POA: Diagnosis not present

## 2020-12-10 DIAGNOSIS — R519 Headache, unspecified: Secondary | ICD-10-CM | POA: Diagnosis not present

## 2020-12-11 ENCOUNTER — Other Ambulatory Visit: Payer: Self-pay

## 2020-12-11 ENCOUNTER — Ambulatory Visit (INDEPENDENT_AMBULATORY_CARE_PROVIDER_SITE_OTHER): Payer: Medicare Other | Admitting: Physician Assistant

## 2020-12-11 VITALS — BP 187/73 | HR 76 | Temp 97.7°F | Resp 20 | Ht 63.0 in

## 2020-12-11 DIAGNOSIS — I129 Hypertensive chronic kidney disease with stage 1 through stage 4 chronic kidney disease, or unspecified chronic kidney disease: Secondary | ICD-10-CM | POA: Insufficient documentation

## 2020-12-11 DIAGNOSIS — N186 End stage renal disease: Secondary | ICD-10-CM

## 2020-12-11 DIAGNOSIS — I721 Aneurysm of artery of upper extremity: Secondary | ICD-10-CM

## 2020-12-11 DIAGNOSIS — Z992 Dependence on renal dialysis: Secondary | ICD-10-CM

## 2020-12-11 DIAGNOSIS — I350 Nonrheumatic aortic (valve) stenosis: Secondary | ICD-10-CM | POA: Insufficient documentation

## 2020-12-11 DIAGNOSIS — F419 Anxiety disorder, unspecified: Secondary | ICD-10-CM | POA: Insufficient documentation

## 2020-12-11 DIAGNOSIS — I5032 Chronic diastolic (congestive) heart failure: Secondary | ICD-10-CM | POA: Insufficient documentation

## 2020-12-11 DIAGNOSIS — E669 Obesity, unspecified: Secondary | ICD-10-CM | POA: Insufficient documentation

## 2020-12-11 DIAGNOSIS — I272 Pulmonary hypertension, unspecified: Secondary | ICD-10-CM | POA: Insufficient documentation

## 2020-12-11 NOTE — Progress Notes (Signed)
    Postoperative Access Visit   History of Present Illness   [name] is a 75 y.o. year old female who presents for postoperative follow-up for: Evacuation of left upper extremity hematoma, primary repair of left brachial artery pseudoaneurysm by Dr. Donzetta Matters on 10/27/20. This was performed urgently secondary to large hematoma causing left hand pain and paresthesias. She had developed the hematoma after dialysis.  The patient's wounds are well healed. Her staples were removed at hospital prior to her discharge. The patient notes no pain in left hand. She does have intermittent shooting pains that do not last long. The patient's current symptoms are numbness and weakness in her 2nd-4th fingers of the left hand. She is doing therapy with "putty and sponge" and exercises which have been helping. The patient is challenged in doing her activities of daily living due to the numbness and weakness in left hand.    She is currently dialyzing via a TDC via catheter on Tues/ Thurs/ Sat at the [facility] location  Physical Examination   Vitals:   12/11/20 1054  BP: (!) 187/73  Pulse: 76  Resp: 20  Temp: 97.7 F (36.5 C)  TempSrc: Temporal  SpO2: 94%  Height: 5\' 3"  (1.6 m)   Body mass index is 28.55 kg/m.  left arm Incision is well healed, 2+ radial pulse, hand grip is 5/5, sensation in digits is diminished in 2nd -4th fingers, she has  palpable thrill, and bruit can be auscultated. She does have some ecchymosis still present at the [facility] and some overlying edema    Medical Decision Making   [name] is a 75 y.o. year old female who presents s/p Evacuation of left upper extremity hematoma, primary repair of left brachial artery pseudoaneurysm by Dr. Donzetta Matters on 10/27/20. Her left upper extremity is well perfused and warm with palpable brachial and ulnar pulses. She continues to have neuropraxia with numbness in her 2nd-4th fingers of the left hand. She is doing therapy with "putty and  sponge" and exercises which have been helping.   Patent is without signs or symptoms of steal syndrome The patient's access can be used immediately at her discretion. I discussed waiting until ecchymosis and edema is resolved but otherwise is well healed and ready to use The patient's tunneled dialysis catheter can be removed when Nephrology is comfortable with the performance of the right IJ [facility] The patient may follow up on a prn basis   Karoline Caldwell, PA-C [facility] Office: [phone]  Clinic MD: Donzetta Matters

## 2020-12-12 DIAGNOSIS — R197 Diarrhea, unspecified: Secondary | ICD-10-CM | POA: Diagnosis not present

## 2020-12-12 DIAGNOSIS — N186 End stage renal disease: Secondary | ICD-10-CM | POA: Diagnosis not present

## 2020-12-12 DIAGNOSIS — R519 Headache, unspecified: Secondary | ICD-10-CM | POA: Diagnosis not present

## 2020-12-12 DIAGNOSIS — Z992 Dependence on renal dialysis: Secondary | ICD-10-CM | POA: Diagnosis not present

## 2020-12-12 DIAGNOSIS — D688 Other specified coagulation defects: Secondary | ICD-10-CM | POA: Diagnosis not present

## 2020-12-12 DIAGNOSIS — N2581 Secondary hyperparathyroidism of renal origin: Secondary | ICD-10-CM | POA: Diagnosis not present

## 2020-12-12 DIAGNOSIS — E875 Hyperkalemia: Secondary | ICD-10-CM | POA: Diagnosis not present

## 2020-12-14 ENCOUNTER — Other Ambulatory Visit: Payer: Self-pay | Admitting: *Deleted

## 2020-12-14 DIAGNOSIS — S72002D Fracture of unspecified part of neck of left femur, subsequent encounter for closed fracture with routine healing: Secondary | ICD-10-CM | POA: Diagnosis not present

## 2020-12-14 NOTE — Patient Outreach (Addendum)
[facility]) Care Management  12/14/2020  [name] September 02, 1945 [id number]  Referral Date: 6/15 Referral Source: Hospital liaison    Referral Reason: Post hospital discharge Insurance: [place] attempt #3, unsuccessful, HIPAA compliant voice message left for member and daughter.  Will make 4th and final attempt within the next 3 weeks.  Valente David, [place], MSN [place] [phone]

## 2020-12-15 DIAGNOSIS — D688 Other specified coagulation defects: Secondary | ICD-10-CM | POA: Diagnosis not present

## 2020-12-15 DIAGNOSIS — N2581 Secondary hyperparathyroidism of renal origin: Secondary | ICD-10-CM | POA: Diagnosis not present

## 2020-12-15 DIAGNOSIS — N186 End stage renal disease: Secondary | ICD-10-CM | POA: Diagnosis not present

## 2020-12-15 DIAGNOSIS — R197 Diarrhea, unspecified: Secondary | ICD-10-CM | POA: Diagnosis not present

## 2020-12-15 DIAGNOSIS — Z992 Dependence on renal dialysis: Secondary | ICD-10-CM | POA: Diagnosis not present

## 2020-12-15 DIAGNOSIS — E875 Hyperkalemia: Secondary | ICD-10-CM | POA: Diagnosis not present

## 2020-12-15 DIAGNOSIS — R519 Headache, unspecified: Secondary | ICD-10-CM | POA: Diagnosis not present

## 2020-12-16 ENCOUNTER — Encounter: Payer: Medicare Other | Attending: Registered Nurse | Admitting: Registered Nurse

## 2020-12-16 DIAGNOSIS — G7281 Critical illness myopathy: Secondary | ICD-10-CM | POA: Diagnosis not present

## 2020-12-16 DIAGNOSIS — I129 Hypertensive chronic kidney disease with stage 1 through stage 4 chronic kidney disease, or unspecified chronic kidney disease: Secondary | ICD-10-CM | POA: Diagnosis not present

## 2020-12-16 DIAGNOSIS — R197 Diarrhea, unspecified: Secondary | ICD-10-CM | POA: Diagnosis not present

## 2020-12-16 DIAGNOSIS — I480 Paroxysmal atrial fibrillation: Secondary | ICD-10-CM | POA: Diagnosis not present

## 2020-12-17 DIAGNOSIS — R197 Diarrhea, unspecified: Secondary | ICD-10-CM | POA: Diagnosis not present

## 2020-12-17 DIAGNOSIS — N2581 Secondary hyperparathyroidism of renal origin: Secondary | ICD-10-CM | POA: Diagnosis not present

## 2020-12-17 DIAGNOSIS — E875 Hyperkalemia: Secondary | ICD-10-CM | POA: Diagnosis not present

## 2020-12-17 DIAGNOSIS — R519 Headache, unspecified: Secondary | ICD-10-CM | POA: Diagnosis not present

## 2020-12-17 DIAGNOSIS — N186 End stage renal disease: Secondary | ICD-10-CM | POA: Diagnosis not present

## 2020-12-17 DIAGNOSIS — D688 Other specified coagulation defects: Secondary | ICD-10-CM | POA: Diagnosis not present

## 2020-12-17 DIAGNOSIS — Z992 Dependence on renal dialysis: Secondary | ICD-10-CM | POA: Diagnosis not present

## 2020-12-19 DIAGNOSIS — N2581 Secondary hyperparathyroidism of renal origin: Secondary | ICD-10-CM | POA: Diagnosis not present

## 2020-12-19 DIAGNOSIS — R519 Headache, unspecified: Secondary | ICD-10-CM | POA: Diagnosis not present

## 2020-12-19 DIAGNOSIS — D688 Other specified coagulation defects: Secondary | ICD-10-CM | POA: Diagnosis not present

## 2020-12-19 DIAGNOSIS — Z992 Dependence on renal dialysis: Secondary | ICD-10-CM | POA: Diagnosis not present

## 2020-12-19 DIAGNOSIS — N186 End stage renal disease: Secondary | ICD-10-CM | POA: Diagnosis not present

## 2020-12-19 DIAGNOSIS — R197 Diarrhea, unspecified: Secondary | ICD-10-CM | POA: Diagnosis not present

## 2020-12-22 DIAGNOSIS — N186 End stage renal disease: Secondary | ICD-10-CM | POA: Diagnosis not present

## 2020-12-22 DIAGNOSIS — D688 Other specified coagulation defects: Secondary | ICD-10-CM | POA: Diagnosis not present

## 2020-12-22 DIAGNOSIS — E875 Hyperkalemia: Secondary | ICD-10-CM | POA: Diagnosis not present

## 2020-12-22 DIAGNOSIS — N2581 Secondary hyperparathyroidism of renal origin: Secondary | ICD-10-CM | POA: Diagnosis not present

## 2020-12-22 DIAGNOSIS — Z992 Dependence on renal dialysis: Secondary | ICD-10-CM | POA: Diagnosis not present

## 2020-12-22 DIAGNOSIS — R197 Diarrhea, unspecified: Secondary | ICD-10-CM | POA: Diagnosis not present

## 2020-12-22 DIAGNOSIS — R519 Headache, unspecified: Secondary | ICD-10-CM | POA: Diagnosis not present

## 2020-12-24 DIAGNOSIS — N186 End stage renal disease: Secondary | ICD-10-CM | POA: Diagnosis not present

## 2020-12-24 DIAGNOSIS — Z992 Dependence on renal dialysis: Secondary | ICD-10-CM | POA: Diagnosis not present

## 2020-12-24 DIAGNOSIS — D688 Other specified coagulation defects: Secondary | ICD-10-CM | POA: Diagnosis not present

## 2020-12-24 DIAGNOSIS — E875 Hyperkalemia: Secondary | ICD-10-CM | POA: Diagnosis not present

## 2020-12-24 DIAGNOSIS — R197 Diarrhea, unspecified: Secondary | ICD-10-CM | POA: Diagnosis not present

## 2020-12-24 DIAGNOSIS — R519 Headache, unspecified: Secondary | ICD-10-CM | POA: Diagnosis not present

## 2020-12-24 DIAGNOSIS — N2581 Secondary hyperparathyroidism of renal origin: Secondary | ICD-10-CM | POA: Diagnosis not present

## 2020-12-26 DIAGNOSIS — N186 End stage renal disease: Secondary | ICD-10-CM | POA: Diagnosis not present

## 2020-12-26 DIAGNOSIS — R519 Headache, unspecified: Secondary | ICD-10-CM | POA: Diagnosis not present

## 2020-12-26 DIAGNOSIS — D688 Other specified coagulation defects: Secondary | ICD-10-CM | POA: Diagnosis not present

## 2020-12-26 DIAGNOSIS — E875 Hyperkalemia: Secondary | ICD-10-CM | POA: Diagnosis not present

## 2020-12-26 DIAGNOSIS — N2581 Secondary hyperparathyroidism of renal origin: Secondary | ICD-10-CM | POA: Diagnosis not present

## 2020-12-26 DIAGNOSIS — Z992 Dependence on renal dialysis: Secondary | ICD-10-CM | POA: Diagnosis not present

## 2020-12-26 DIAGNOSIS — R197 Diarrhea, unspecified: Secondary | ICD-10-CM | POA: Diagnosis not present

## 2020-12-29 DIAGNOSIS — D688 Other specified coagulation defects: Secondary | ICD-10-CM | POA: Diagnosis not present

## 2020-12-29 DIAGNOSIS — R519 Headache, unspecified: Secondary | ICD-10-CM | POA: Diagnosis not present

## 2020-12-29 DIAGNOSIS — D631 Anemia in chronic kidney disease: Secondary | ICD-10-CM | POA: Diagnosis not present

## 2020-12-29 DIAGNOSIS — N186 End stage renal disease: Secondary | ICD-10-CM | POA: Diagnosis not present

## 2020-12-29 DIAGNOSIS — R197 Diarrhea, unspecified: Secondary | ICD-10-CM | POA: Diagnosis not present

## 2020-12-29 DIAGNOSIS — E039 Hypothyroidism, unspecified: Secondary | ICD-10-CM | POA: Diagnosis not present

## 2020-12-29 DIAGNOSIS — N2581 Secondary hyperparathyroidism of renal origin: Secondary | ICD-10-CM | POA: Diagnosis not present

## 2020-12-29 DIAGNOSIS — Z992 Dependence on renal dialysis: Secondary | ICD-10-CM | POA: Diagnosis not present

## 2020-12-29 DIAGNOSIS — I482 Chronic atrial fibrillation, unspecified: Secondary | ICD-10-CM | POA: Diagnosis not present

## 2020-12-29 DIAGNOSIS — Z5181 Encounter for therapeutic drug level monitoring: Secondary | ICD-10-CM | POA: Diagnosis not present

## 2020-12-31 ENCOUNTER — Emergency Department (HOSPITAL_COMMUNITY): Payer: Medicare Other

## 2020-12-31 ENCOUNTER — Inpatient Hospital Stay (HOSPITAL_COMMUNITY)
Admission: EM | Admit: 2020-12-31 | Discharge: 2021-01-09 | DRG: 871 | Disposition: A | Discharge status: Home Health | Payer: Medicare Other | Attending: Internal Medicine | Admitting: Internal Medicine

## 2020-12-31 DIAGNOSIS — E871 Hypo-osmolality and hyponatremia: Secondary | ICD-10-CM | POA: Diagnosis present

## 2020-12-31 DIAGNOSIS — B964 Proteus (mirabilis) (morganii) as the cause of diseases classified elsewhere: Secondary | ICD-10-CM | POA: Diagnosis present

## 2020-12-31 DIAGNOSIS — Z885 Allergy status to narcotic agent status: Secondary | ICD-10-CM

## 2020-12-31 DIAGNOSIS — Z9049 Acquired absence of other specified parts of digestive tract: Secondary | ICD-10-CM

## 2020-12-31 DIAGNOSIS — R634 Abnormal weight loss: Secondary | ICD-10-CM | POA: Diagnosis present

## 2020-12-31 DIAGNOSIS — Z9841 Cataract extraction status, right eye: Secondary | ICD-10-CM

## 2020-12-31 DIAGNOSIS — I480 Paroxysmal atrial fibrillation: Secondary | ICD-10-CM | POA: Diagnosis present

## 2020-12-31 DIAGNOSIS — E8889 Other specified metabolic disorders: Secondary | ICD-10-CM | POA: Diagnosis present

## 2020-12-31 DIAGNOSIS — I953 Hypotension of hemodialysis: Secondary | ICD-10-CM | POA: Diagnosis not present

## 2020-12-31 DIAGNOSIS — I5042 Chronic combined systolic (congestive) and diastolic (congestive) heart failure: Secondary | ICD-10-CM | POA: Diagnosis not present

## 2020-12-31 DIAGNOSIS — I132 Hypertensive heart and chronic kidney disease with heart failure and with stage 5 chronic kidney disease, or end stage renal disease: Secondary | ICD-10-CM | POA: Diagnosis present

## 2020-12-31 DIAGNOSIS — Z90710 Acquired absence of both cervix and uterus: Secondary | ICD-10-CM

## 2020-12-31 DIAGNOSIS — E785 Hyperlipidemia, unspecified: Secondary | ICD-10-CM | POA: Diagnosis present

## 2020-12-31 DIAGNOSIS — R509 Fever, unspecified: Secondary | ICD-10-CM | POA: Diagnosis not present

## 2020-12-31 DIAGNOSIS — A419 Sepsis, unspecified organism: Secondary | ICD-10-CM | POA: Diagnosis not present

## 2020-12-31 DIAGNOSIS — A4151 Sepsis due to Escherichia coli [E. coli]: Secondary | ICD-10-CM | POA: Diagnosis not present

## 2020-12-31 DIAGNOSIS — I959 Hypotension, unspecified: Secondary | ICD-10-CM | POA: Diagnosis not present

## 2020-12-31 DIAGNOSIS — Z1612 Extended spectrum beta lactamase (ESBL) resistance: Secondary | ICD-10-CM | POA: Diagnosis present

## 2020-12-31 DIAGNOSIS — I517 Cardiomegaly: Secondary | ICD-10-CM | POA: Diagnosis not present

## 2020-12-31 DIAGNOSIS — I219 Acute myocardial infarction, unspecified: Secondary | ICD-10-CM | POA: Diagnosis not present

## 2020-12-31 DIAGNOSIS — J9811 Atelectasis: Secondary | ICD-10-CM | POA: Diagnosis present

## 2020-12-31 DIAGNOSIS — Z96642 Presence of left artificial hip joint: Secondary | ICD-10-CM | POA: Diagnosis present

## 2020-12-31 DIAGNOSIS — R5381 Other malaise: Secondary | ICD-10-CM | POA: Diagnosis not present

## 2020-12-31 DIAGNOSIS — K529 Noninfective gastroenteritis and colitis, unspecified: Secondary | ICD-10-CM

## 2020-12-31 DIAGNOSIS — R519 Headache, unspecified: Secondary | ICD-10-CM | POA: Diagnosis not present

## 2020-12-31 DIAGNOSIS — D688 Other specified coagulation defects: Secondary | ICD-10-CM | POA: Diagnosis not present

## 2020-12-31 DIAGNOSIS — Z992 Dependence on renal dialysis: Secondary | ICD-10-CM | POA: Diagnosis not present

## 2020-12-31 DIAGNOSIS — I10 Essential (primary) hypertension: Secondary | ICD-10-CM | POA: Diagnosis present

## 2020-12-31 DIAGNOSIS — Z96652 Presence of left artificial knee joint: Secondary | ICD-10-CM | POA: Diagnosis present

## 2020-12-31 DIAGNOSIS — D631 Anemia in chronic kidney disease: Secondary | ICD-10-CM | POA: Diagnosis not present

## 2020-12-31 DIAGNOSIS — I471 Supraventricular tachycardia: Secondary | ICD-10-CM | POA: Diagnosis present

## 2020-12-31 DIAGNOSIS — Z888 Allergy status to other drugs, medicaments and biological substances status: Secondary | ICD-10-CM

## 2020-12-31 DIAGNOSIS — Z5181 Encounter for therapeutic drug level monitoring: Secondary | ICD-10-CM | POA: Diagnosis not present

## 2020-12-31 DIAGNOSIS — E876 Hypokalemia: Secondary | ICD-10-CM | POA: Diagnosis present

## 2020-12-31 DIAGNOSIS — N186 End stage renal disease: Secondary | ICD-10-CM | POA: Diagnosis not present

## 2020-12-31 DIAGNOSIS — K921 Melena: Secondary | ICD-10-CM | POA: Diagnosis not present

## 2020-12-31 DIAGNOSIS — Z800 Family history of malignant neoplasm of digestive organs: Secondary | ICD-10-CM

## 2020-12-31 DIAGNOSIS — E8809 Other disorders of plasma-protein metabolism, not elsewhere classified: Secondary | ICD-10-CM | POA: Diagnosis not present

## 2020-12-31 DIAGNOSIS — R531 Weakness: Secondary | ICD-10-CM

## 2020-12-31 DIAGNOSIS — I4891 Unspecified atrial fibrillation: Secondary | ICD-10-CM | POA: Diagnosis present

## 2020-12-31 DIAGNOSIS — K625 Hemorrhage of anus and rectum: Secondary | ICD-10-CM | POA: Diagnosis not present

## 2020-12-31 DIAGNOSIS — Z8249 Family history of ischemic heart disease and other diseases of the circulatory system: Secondary | ICD-10-CM

## 2020-12-31 DIAGNOSIS — D62 Acute posthemorrhagic anemia: Secondary | ICD-10-CM | POA: Diagnosis not present

## 2020-12-31 DIAGNOSIS — Z20822 Contact with and (suspected) exposure to covid-19: Secondary | ICD-10-CM | POA: Diagnosis present

## 2020-12-31 DIAGNOSIS — D649 Anemia, unspecified: Secondary | ICD-10-CM | POA: Diagnosis not present

## 2020-12-31 DIAGNOSIS — Z8719 Personal history of other diseases of the digestive system: Secondary | ICD-10-CM

## 2020-12-31 DIAGNOSIS — R197 Diarrhea, unspecified: Secondary | ICD-10-CM | POA: Diagnosis not present

## 2020-12-31 DIAGNOSIS — I350 Nonrheumatic aortic (valve) stenosis: Secondary | ICD-10-CM | POA: Diagnosis not present

## 2020-12-31 DIAGNOSIS — I472 Ventricular tachycardia: Secondary | ICD-10-CM | POA: Diagnosis present

## 2020-12-31 DIAGNOSIS — J811 Chronic pulmonary edema: Secondary | ICD-10-CM | POA: Diagnosis not present

## 2020-12-31 DIAGNOSIS — I482 Chronic atrial fibrillation, unspecified: Secondary | ICD-10-CM | POA: Diagnosis not present

## 2020-12-31 DIAGNOSIS — K922 Gastrointestinal hemorrhage, unspecified: Secondary | ICD-10-CM | POA: Diagnosis not present

## 2020-12-31 DIAGNOSIS — Z79899 Other long term (current) drug therapy: Secondary | ICD-10-CM

## 2020-12-31 DIAGNOSIS — Z7901 Long term (current) use of anticoagulants: Secondary | ICD-10-CM

## 2020-12-31 DIAGNOSIS — Z6826 Body mass index (BMI) 26.0-26.9, adult: Secondary | ICD-10-CM

## 2020-12-31 DIAGNOSIS — K449 Diaphragmatic hernia without obstruction or gangrene: Secondary | ICD-10-CM | POA: Diagnosis not present

## 2020-12-31 DIAGNOSIS — N390 Urinary tract infection, site not specified: Secondary | ICD-10-CM | POA: Diagnosis not present

## 2020-12-31 DIAGNOSIS — E039 Hypothyroidism, unspecified: Secondary | ICD-10-CM | POA: Diagnosis not present

## 2020-12-31 DIAGNOSIS — G9341 Metabolic encephalopathy: Secondary | ICD-10-CM | POA: Diagnosis not present

## 2020-12-31 DIAGNOSIS — Z90722 Acquired absence of ovaries, bilateral: Secondary | ICD-10-CM

## 2020-12-31 DIAGNOSIS — I120 Hypertensive chronic kidney disease with stage 5 chronic kidney disease or end stage renal disease: Secondary | ICD-10-CM | POA: Diagnosis not present

## 2020-12-31 DIAGNOSIS — R0902 Hypoxemia: Secondary | ICD-10-CM | POA: Diagnosis not present

## 2020-12-31 DIAGNOSIS — R4182 Altered mental status, unspecified: Secondary | ICD-10-CM | POA: Diagnosis not present

## 2020-12-31 DIAGNOSIS — N2581 Secondary hyperparathyroidism of renal origin: Secondary | ICD-10-CM | POA: Diagnosis not present

## 2020-12-31 DIAGNOSIS — Z961 Presence of intraocular lens: Secondary | ICD-10-CM | POA: Diagnosis present

## 2020-12-31 DIAGNOSIS — Z9842 Cataract extraction status, left eye: Secondary | ICD-10-CM

## 2020-12-31 DIAGNOSIS — K649 Unspecified hemorrhoids: Secondary | ICD-10-CM | POA: Diagnosis present

## 2020-12-31 DIAGNOSIS — Z833 Family history of diabetes mellitus: Secondary | ICD-10-CM

## 2020-12-31 DIAGNOSIS — Z8052 Family history of malignant neoplasm of bladder: Secondary | ICD-10-CM

## 2020-12-31 LAB — CBC WITH DIFFERENTIAL/PLATELET
Abs Immature Granulocytes: 0.12 10*3/uL — ABNORMAL HIGH (ref 0.00–0.07)
Basophils Absolute: 0.1 10*3/uL (ref 0.0–0.1)
Basophils Relative: 0 %
Eosinophils Absolute: 0 10*3/uL (ref 0.0–0.5)
Eosinophils Relative: 0 %
HCT: 29 % — ABNORMAL LOW (ref 36.0–46.0)
Hemoglobin: 9.2 g/dL — ABNORMAL LOW (ref 12.0–15.0)
Immature Granulocytes: 1 %
Lymphocytes Relative: 2 %
Lymphs Abs: 0.3 10*3/uL — ABNORMAL LOW (ref 0.7–4.0)
MCH: 31.1 pg (ref 26.0–34.0)
MCHC: 31.7 g/dL (ref 30.0–36.0)
MCV: 98 fL (ref 80.0–100.0)
Monocytes Absolute: 1.2 10*3/uL — ABNORMAL HIGH (ref 0.1–1.0)
Monocytes Relative: 6 %
Neutro Abs: 18.5 10*3/uL — ABNORMAL HIGH (ref 1.7–7.7)
Neutrophils Relative %: 91 %
Platelets: 267 10*3/uL (ref 150–400)
RBC: 2.96 MIL/uL — ABNORMAL LOW (ref 3.87–5.11)
RDW: 15.8 % — ABNORMAL HIGH (ref 11.5–15.5)
WBC: 20.3 10*3/uL — ABNORMAL HIGH (ref 4.0–10.5)
nRBC: 0 % (ref 0.0–0.2)

## 2020-12-31 LAB — PROTIME-INR
INR: 1.8 — ABNORMAL HIGH (ref 0.8–1.2)
Prothrombin Time: 20.5 seconds — ABNORMAL HIGH (ref 11.4–15.2)

## 2020-12-31 LAB — URINALYSIS, MICROSCOPIC (REFLEX)
RBC / HPF: >50 RBC/hpf (ref 0–5)
Squamous Epithelial / HPF: NONE SEEN (ref 0–5)
WBC, UA: >50 WBC/hpf (ref 0–5)

## 2020-12-31 LAB — URINALYSIS, ROUTINE W REFLEX MICROSCOPIC

## 2020-12-31 LAB — RESP PANEL BY RT-PCR (FLU A&B, COVID) ARPGX2
Influenza A by PCR: NEGATIVE
Influenza B by PCR: NEGATIVE
SARS Coronavirus 2 by RT PCR: NEGATIVE

## 2020-12-31 LAB — COMPREHENSIVE METABOLIC PANEL
ALT: 10 U/L (ref 0–44)
AST: 13 U/L — ABNORMAL LOW (ref 15–41)
Albumin: 1.8 g/dL — ABNORMAL LOW (ref 3.5–5.0)
Alkaline Phosphatase: 87 U/L (ref 38–126)
Anion gap: 10 (ref 5–15)
BUN: 10 mg/dL (ref 8–23)
CO2: 31 mmol/L (ref 22–32)
Calcium: 7.8 mg/dL — ABNORMAL LOW (ref 8.9–10.3)
Chloride: 92 mmol/L — ABNORMAL LOW (ref 98–111)
Creatinine, Ser: 2.55 mg/dL — ABNORMAL HIGH (ref 0.44–1.00)
GFR, Estimated: 19 mL/min — ABNORMAL LOW (ref >60)
Glucose, Bld: 115 mg/dL — ABNORMAL HIGH (ref 70–99)
Potassium: 3.1 mmol/L — ABNORMAL LOW (ref 3.5–5.1)
Sodium: 133 mmol/L — ABNORMAL LOW (ref 135–145)
Total Bilirubin: 0.5 mg/dL (ref 0.3–1.2)
Total Protein: 4.6 g/dL — ABNORMAL LOW (ref 6.5–8.1)

## 2020-12-31 LAB — LACTIC ACID, PLASMA: Lactic Acid, Venous: 1.6 mmol/L (ref 0.5–1.9)

## 2020-12-31 LAB — POC OCCULT BLOOD, ED: Fecal Occult Bld: POSITIVE — AB

## 2020-12-31 LAB — AMMONIA: Ammonia: 17 umol/L (ref 9–35)

## 2020-12-31 LAB — APTT: aPTT: 46 seconds — ABNORMAL HIGH (ref 24–36)

## 2020-12-31 MED ORDER — HEPARIN SODIUM (PORCINE) 5000 UNIT/ML IJ SOLN
5000.0000 [IU] | Freq: Three times a day (TID) | INTRAMUSCULAR | Status: DC
  Administered 2020-12-31: 5000 [IU] via SUBCUTANEOUS
  Filled 2020-12-31: qty 1

## 2020-12-31 MED ORDER — ACETAMINOPHEN 325 MG PO TABS
650.0000 mg | ORAL_TABLET | Freq: Four times a day (QID) | ORAL | Status: DC | PRN
  Administered 2020-12-31 – 2021-01-09 (×11): 650 mg via ORAL
  Filled 2020-12-31 (×11): qty 2

## 2020-12-31 MED ORDER — SODIUM CHLORIDE 0.9 % IV SOLN
1.0000 g | Freq: Once | INTRAVENOUS | Status: AC
  Administered 2020-12-31: 1 g via INTRAVENOUS
  Filled 2020-12-31: qty 10

## 2020-12-31 MED ORDER — SODIUM CHLORIDE 0.9 % IV SOLN: 1.0000 g | INTRAVENOUS | Status: DC

## 2020-12-31 NOTE — ED Provider Notes (Signed)
[facility] EMERGENCY DEPARTMENT Provider Note   CSN: [id number] Arrival date & time: 12/31/20  1640     History Chief Complaint  Patient presents with   Weakness    Not feeling well    [name] is a 75 y.o. female.  The history is provided by the patient, a relative and medical records.  Weakness [name] is a 75 y.o. female who presents to the Emergency Department complaining of weakness and confusion.  She started feeling unwell and confused with slurred words, difficulty in following directions yesterday.  Sxs significantly worsened today.  She was able to go to dialysis today and received a full session.  Upon returning home she developed fever to 100.3 and she was unable to ambulate (two person assist to get to EMS stretcher).    Has been complaining of headache this afternoon.   No sore throat, cough, sob.  Not on oxygen.  Had nausea the last two days.      Past Medical History:  Diagnosis Date   Arthritis of left knee    Cardiomyopathy ([place])    a. h/o LV dysfunction EF 20-25% in 2013 due to sepsis.>> improved to normal    Chronic diastolic CHF (congestive heart failure) ([place])    10/ 2013 in setting of septic shock   Complication of anesthesia    use a little anesthesia , per patient MD states she quit breathing (2016); hard to wake up   ESRD (end stage renal disease) ([place])    dialysis Tues, Thurs, Sat [address], sees dr deterding    GERD (gastroesophageal reflux disease)    Glaucoma    both eyes   H/O hiatal hernia    a. CT 2017: large gastric hiatal hernia.   Headache(784.0)    migraine hx of   History of blood transfusion 04/13/2015    History of echocardiogram    a. Echo 6/17: EF 60-65%, normal wall motion, mild MR, atrial septal lipomatous hypertrophy, PASP 34 mmHg, possible trivial free-flowing pericardial effusion along RV free wall // b. Echo 5/17: Mild LVH, EF 55-60%, normal wall motion, grade 1 diastolic dysfunction,  trivial MR, severe LAE, mild RAE, PASP 42 mmHg   History of kidney stones    10/18/2019: per patient "has a couple currently one in each kidney"   History of nephrostomy 04/11/2015   currently inplace 04/28/2015  removed now   History of nuclear stress test    a. Myoview 1/14 - Marked ischemia in the basal anterior, mid anterior, apical septal and apical inferior regions, EF 63% >> LHC normal    Hyperlipidemia    Hypertension    medication removed from regimen due to low blood pressure    Iron deficiency    hx   Myocardial infarction [facility]) 2013   10/18/2019: per patient "in 2013)   Nephrolithiasis 2002, 2006   bilateral   Normal coronary arteries 2014   a. LHC in 1/14: normal coronary arteries   PAF (paroxysmal atrial fibrillation) ([place])    a. 10/ 2013  in setting of Septic Shock //  b. recurrent during admit for pneumonia, L effusion >> placed on Amiodarone // Coumadin for anticoagulation   Pneumonia jan 2018, last tme lungs clear now   dx 10-06-2014 per CXR--  on 10-27-2014 pt states finished antibiotic and denies cough or fever   Primary localized osteoarthritis of right hip 10/22/2019   S/P hemodialysis catheter insertion ([place]) 04/11/2015    right anterior chest ,  only used once    Sigmoid diverticulosis    SOB (shortness of breath) on exertion    10/18/2019: per patient "get short of breath with activity sometimes due to heart valve"   UTI (urinary tract infection) 05/10/2016    Patient Active Problem List   Diagnosis Date Noted   Weakness 12/31/2020   Chronic diastolic heart failure ([place]) 12/11/2020   Anxiety 12/11/2020   Malignant hypertensive chronic kidney disease 12/11/2020   Nonrheumatic aortic (valve) stenosis 12/11/2020   Obesity 12/11/2020   Pulmonary hypertension ([place]) 12/11/2020   Current mild episode of major depressive disorder (HCC)    Labile blood pressure    Pain in left arm 11/11/2020   Chronic UTI    Loose stools    ESRD on dialysis ([place])    Acute on  chronic anemia    Post-operative pain    Debility 11/03/2020   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   Compartment syndrome ([place]) 10/27/2020   Stress fracture, hip, unspecified, initial encounter for fracture 06/25/2020   Hip fracture ([place]) 06/07/2020   Leukocytosis 06/07/2020   Acute hypoxemic respiratory failure ([place]) 06/07/2020   Atrial fibrillation ([place]) 06/07/2020   ESRD (end stage renal disease) ([place]) 06/07/2020   Gout, unspecified 11/14/2019   Primary localized osteoarthritis of left knee 10/22/2019   S/P TKR (total knee replacement), left 10/22/2019   Other long term (current) drug therapy 05/28/2019   Allergy, unspecified, initial encounter 04/08/2019   Gastroenteritis due to norovirus    Anticoagulated on Coumadin    Hiatal hernia    Melena    UGI bleed 08/23/2018   Hypokalemia 08/23/2018   Hyperlipidemia 08/23/2018   GERD (gastroesophageal reflux disease) 08/23/2018   Hypertension 08/23/2018   Personal history of Methicillin resistant Staphylococcus aureus infection 05/02/2018   Methicillin resistant Staphylococcus aureus infection, unspecified site 03/22/2018   Hypothyroidism, unspecified 03/09/2017   CAP (community acquired pneumonia) 07/02/2016   Dehydration    PNA (pneumonia) 05/10/2016   Pain, unspecified 03/10/2016   Chills (without fever) 01/30/2016   On amiodarone therapy 11/30/2015   Pleural effusion on left    Pleurisy    Hypoxemia    HCAP (healthcare-associated pneumonia) 11/21/2015   S/P thoracentesis    Chest pain 11/20/2015   Pleural effusion, left 11/20/2015   Pain in the chest    Pericardial effusion    Pleural effusion    Encounter for therapeutic drug monitoring 11/13/2015   Anemia in chronic kidney disease 11/06/2015   Iron deficiency anemia, unspecified 11/06/2015   Other specified coagulation defects ([place]) 11/06/2015   Pruritus, unspecified 11/06/2015   Secondary hyperparathyroidism of renal origin ([place]) 11/06/2015   Shortness of  breath 11/06/2015   Atrial fibrillation with RVR (HCC)    PAF (paroxysmal atrial fibrillation) ([place]) 11/05/2015   Urinary tract infectious disease 10/31/2015   Pyelonephritis 09/25/2015   Sepsis ([place]) 09/25/2015   Anemia of renal disease 08/14/2015   Severe protein-calorie malnutrition ([place]) 05/27/2015   ESRD (end stage renal disease) ([place])    Normocytic anemia 11/23/2014   Essential hypertension, benign 08/14/2013   Glaucoma     Past Surgical History:  Procedure Laterality Date   AV FISTULA PLACEMENT Left 06/02/2015   Procedure: BRACHIOCEPHALIC ARTERIOVENOUS (AV) FISTULA CREATION ;  Surgeon: Conrad Marrowstone, MD;  Location: [place];  Service: Vascular;  Laterality: Left;   [place] Left 07/27/2015   Procedure: FIRST STAGE BASILIC VEIN TRANSPOSITION LEFT UPPER ARM;  Surgeon: Conrad Griffin, MD;  Location: [place];  Service: Vascular;  Laterality: Left;   BASCILIC VEIN TRANSPOSITION Left 09/2015   second phase   BASCILIC VEIN TRANSPOSITION Left 10/12/2015   Procedure: SECOND STAGE BASILIC VEIN TRANSPOSITION LEFT ARM;  Surgeon: Conrad P[place], MD;  Location: [place];  Service: Vascular;  Laterality: Left;   BREAST BIOPSY Left 08/23/07   benign fibrocystic with duct ectasia   CARDIAC CATHETERIZATION  07-11-2012  dr Irish Lack   Abnormal stress test/   normal coronary arteries/  LVEDP  44mmHg   CARDIOVASCULAR STRESS TEST  06-26-2012  dr Irish Lack   marked ischemia in the basal anterior, mid anterior, apical inferior regions/  normal LVF, ef 63%   CATARACT EXTRACTION W/ INTRAOCULAR LENS  IMPLANT, BILATERAL     COLONOSCOPY WITH PROPOFOL N/A 10/17/2016   Procedure: COLONOSCOPY WITH PROPOFOL;  Surgeon: Garlan Fair, MD;  Location: [facility];  Service: Endoscopy;  Laterality: N/A;   CYSTOSCOPY W/ URETERAL STENT PLACEMENT  04/04/2012   Procedure: CYSTOSCOPY WITH RETROGRADE PYELOGRAM/URETERAL STENT PLACEMENT;  Surgeon: Ailene Rud, MD;  Location: [place];  Service: Urology;   Laterality: Left;   CYSTOSCOPY W/ URETERAL STENT PLACEMENT Bilateral 05/04/2015   Procedure: CYSTOSCOPY WITH BILATERAL RETROGRADE PYELOGRAM/ WITH INTERPRETATION, EXCHANGE OF RIGHT URETERAL STENT REPLACEMENT AND PLACEMENT LEFT URETERAL STENT PLACEMENT EXAMINATION OF VAGINA;  Surgeon: Carolan Clines, MD;  Location: [facility];  Service: Urology;  Laterality: Bilateral;   CYSTOSCOPY WITH STENT PLACEMENT Right 10/28/2014   Procedure: RIGHT URETERAL STENT PLACEMENT;  Surgeon: Irine Seal, MD;  Location[facility];  Service: Urology;  Laterality: Right;   CYSTOSCOPY WITH STENT PLACEMENT Right 02/26/2015   Procedure: CYSTOSCOPY RETROGRADE PYELOGRAM WITH STENT PLACEMENT;  Surgeon: Cleon Gustin, MD;  Location: [facility];  Service: Urology;  Laterality: Right;   CYSTOSCOPY/RETROGRADE/URETEROSCOPY/STONE EXTRACTION WITH BASKET Right 11/21/2014   Procedure: CYSTOSCOPY/RIGHT RETROGRADE PYELOGRAM/RIGHT URETEROSCOPY/BASKET EXTRACTION/RIGHT PYELOSCOPY/LASER OF STONE/RIGHT DOUBLE J STENT;  Surgeon: Carolan Clines, MD;  Location: [place];  Service: Urology;  Laterality: Right;   ESOPHAGOGASTRODUODENOSCOPY (EGD) WITH PROPOFOL Left 08/25/2018   Procedure: ESOPHAGOGASTRODUODENOSCOPY (EGD) WITH PROPOFOL;  Surgeon: Ronald Lobo, MD;  Location: [place];  Service: Endoscopy;  Laterality: Left;   EXTRACORPOREAL SHOCK WAVE LITHOTRIPSY  05-28-2012  &  10-08-2012   HEMATOMA EVACUATION Left 10/27/2020   Procedure: EVACUATION HEMATOMA, repair of Pseudoaneurysm;  Surgeon: Waynetta Sandy, MD;  Location: [place];  Service: Vascular;  Laterality: Left;   HIP ARTHROPLASTY Left 06/09/2020   Procedure: ARTHROPLASTY BIPOLAR HIP (HEMIARTHROPLASTY);  Surgeon: Marchia Bond, MD;  Location: [place];  Service: Orthopedics;  Laterality: Left;   HOLMIUM LASER APPLICATION Right 09/19/6832   Procedure: HOLMIUM LASER APPLICATION;  Surgeon: Carolan Clines, MD;  Location[facility];   Service: Urology;  Laterality: Right;   IR GENERIC HISTORICAL  02/01/2016   IR NEPHROSTOMY EXCHANGE RIGHT 02/01/2016 Greggory Keen, MD [facility]   IR GENERIC HISTORICAL  02/24/2016   IR PATIENT EVAL TECH 0-60 MINS 02/24/2016 Aletta Edouard, MD [facility]   KNEE ARTHROSCOPY Left 02-14-2003   LAPAROSCOPIC CHOLECYSTECTOMY  03-23-2005   TOTAL ABDOMINAL HYSTERECTOMY W/ BILATERAL SALPINGOOPHORECTOMY  1993   secondary to fibroids   TOTAL KNEE ARTHROPLASTY Left 10/22/2019   TOTAL KNEE ARTHROPLASTY Left 10/22/2019   Procedure: TOTAL KNEE ARTHROPLASTY;  Surgeon: Marchia Bond, MD;  Location: [place];  Service: Orthopedics;  Laterality: Left;   TRANSTHORACIC ECHOCARDIOGRAM  04-09-2012   normal LVF,  ef 60-65%,  mild LAE,  mild TR, trivial MR and PR  OB History     Gravida  1   Para  1   Term  1   Preterm  0   AB  0   Living  1      SAB  0   IAB  0   Ectopic  0   Multiple      Live Births  1           Family History  Problem Relation Age of Onset   Hypertension Mother    Cancer Mother 66       breast   Dementia Mother    Hypertension Brother    Diabetes Brother    Heart disease Brother        before age 5   Cancer Father 71       pancreatic   Heart failure Paternal Grandmother    Bladder Cancer Maternal Grandfather     Social History   Tobacco Use   Smoking status: Never   Smokeless tobacco: Never  Vaping Use   Vaping Use: Never used  Substance Use Topics   Alcohol use: No    Alcohol/week: 0.0 standard drinks   Drug use: No    Home Medications Prior to Admission medications   Medication Sig Start Date End Date Taking? Authorizing Provider  amiodarone (PACERONE) 200 MG tablet Take 1 tablet (200 mg total) by mouth daily. 08/24/20   Jettie Booze, MD  apixaban (ELIQUIS) 2.5 MG TABS tablet Take 1 tablet (2.5 mg total) by mouth 2 (two) times daily. 09/14/20   Jettie Booze, MD  cephALEXin (KEFLEX) 250 MG capsule Take 250 mg by mouth daily.  05/24/20   [provider]  gabapentin (NEURONTIN) 600 MG tablet Take 0.5 tablets (300 mg total) by mouth at bedtime. 12/02/20   Love, Ivan Anchors, PA-C  heparin 1000 unit/mL SOLN injection Heparin Sodium (Porcine) 1,000 Units/mL Catheter Lock Arterial 10/24/20 10/23/21  [provider]  multivitamin (RENA-VIT) TABS tablet Take 1 tablet by mouth at bedtime. 07/27/19   [provider]  polycarbophil (FIBERCON) 625 MG tablet Take 1 tablet (625 mg total) by mouth 2 (two) times daily before lunch and supper. 12/02/20   Love, Ivan Anchors, PA-C  saccharomyces boulardii (FLORASTOR) 250 MG capsule Take 1 capsule (250 mg total) by mouth 2 (two) times daily. 07/08/15   Donne Hazel, MD    Allergies    Astemizole, Fluorouracil, Vicodin [hydrocodone-acetaminophen], Chlorhexidine, and Percocet [oxycodone-acetaminophen]  Review of Systems   Review of Systems  Neurological:  Positive for weakness.  All other systems reviewed and are negative.  Physical Exam Updated Vital Signs BP (!) 132/52 (BP Location: Right Arm)   Pulse 72   Temp 98.3 F (36.8 C)   Resp 16   LMP 01/19/1992 (Approximate)   SpO2 99%   Physical Exam Vitals and nursing note reviewed.  Constitutional:      Appearance: She is well-developed.  HENT:     Head: Normocephalic and atraumatic.  Cardiovascular:     Rate and Rhythm: Normal rate and regular rhythm.     Heart sounds: Murmur heard.  Pulmonary:     Effort: Pulmonary effort is normal. No respiratory distress.     Breath sounds: Normal breath sounds.     Comments: Vas cath in right upper chest wall with dressing in place cdi Abdominal:     Palpations: Abdomen is soft.     Tenderness: There is no abdominal tenderness. There is no guarding or rebound.  Musculoskeletal:        General: No tenderness.     Comments: 2+ pitting edema to BLE.  1+ pitting edema to LUE,  fistula in LUE with palpable thrill.    Skin:    General: Skin is warm and dry.      Coloration: Skin is pale.  Neurological:     Mental Status: She is alert and oriented to person, place, and time.     Comments: 4/5 strength in all four extremities  Psychiatric:        Behavior: Behavior normal.    ED Results / Procedures / Treatments   Labs (all labs ordered are listed, but only abnormal results are displayed) Labs Reviewed  COMPREHENSIVE METABOLIC PANEL - Abnormal; Notable for the following components:      Result Value   Sodium 133 (*)    Potassium 3.1 (*)    Chloride 92 (*)    Glucose, Bld 115 (*)    Creatinine, Ser 2.55 (*)    Calcium 7.8 (*)    Total Protein 4.6 (*)    Albumin 1.8 (*)    AST 13 (*)    GFR, Estimated 19 (*)    All other components within normal limits  CBC WITH DIFFERENTIAL/PLATELET - Abnormal; Notable for the following components:   WBC 20.3 (*)    RBC 2.96 (*)    Hemoglobin 9.2 (*)    HCT 29.0 (*)    RDW 15.8 (*)    Neutro Abs 18.5 (*)    Lymphs Abs 0.3 (*)    Monocytes Absolute 1.2 (*)    Abs Immature Granulocytes 0.12 (*)    All other components within normal limits  PROTIME-INR - Abnormal; Notable for the following components:   Prothrombin Time 20.5 (*)    INR 1.8 (*)    All other components within normal limits  APTT - Abnormal; Notable for the following components:   aPTT 46 (*)    All other components within normal limits  URINALYSIS, ROUTINE W REFLEX MICROSCOPIC - Abnormal; Notable for the following components:   APPearance TURBID (*)    Glucose, UA   (*)    Value: TEST NOT REPORTED DUE TO COLOR INTERFERENCE OF URINE PIGMENT   Hgb urine dipstick   (*)    Value: TEST NOT REPORTED DUE TO COLOR INTERFERENCE OF URINE PIGMENT   Bilirubin Urine   (*)    Value: TEST NOT REPORTED DUE TO COLOR INTERFERENCE OF URINE PIGMENT   Ketones, ur   (*)    Value: TEST NOT REPORTED DUE TO COLOR INTERFERENCE OF URINE PIGMENT   Protein, ur   (*)    Value: TEST NOT REPORTED DUE TO COLOR INTERFERENCE OF URINE PIGMENT   Nitrite   (*)     Value: TEST NOT REPORTED DUE TO COLOR INTERFERENCE OF URINE PIGMENT   Leukocytes,Ua   (*)    Value: TEST NOT REPORTED DUE TO COLOR INTERFERENCE OF URINE PIGMENT   All other components within normal limits  URINALYSIS, MICROSCOPIC (REFLEX) - Abnormal; Notable for the following components:   Bacteria, UA MANY (*)    All other components within normal limits  POC OCCULT BLOOD, ED - Abnormal; Notable for the following components:   Fecal Occult Bld POSITIVE (*)    All other components within normal limits  RESP PANEL BY RT-PCR (FLU A&B, COVID) [id number]  URINE CULTURE  CULTURE, BLOOD (ROUTINE X 2)  CULTURE, BLOOD (ROUTINE X 2)  LACTIC ACID, PLASMA  LACTIC ACID, PLASMA  AMMONIA    EKG EKG Interpretation  Date/Time:  Thursday December 31 2020 16:48:01 EDT Ventricular Rate:  83 PR Interval:  181 QRS Duration: 126 QT Interval:  426 QTC Calculation: 501 R Axis:   87 Text Interpretation: Sinus rhythm Nonspecific intraventricular conduction delay Nonspecific repol abnormality, diffuse leads Confirmed by Quintella Reichert [phone]) on 12/31/2020 4:53:59 PM  Radiology CT Head Wo Contrast  Result Date: 12/31/2020 CLINICAL DATA:  Mental status changes. EXAM: CT HEAD WITHOUT CONTRAST TECHNIQUE: Contiguous axial images were obtained from the base of the skull through the vertex without intravenous contrast. COMPARISON:  06/07/2020 FINDINGS: Brain: There is no evidence for acute hemorrhage, hydrocephalus, mass lesion, or abnormal extra-axial fluid collection. No definite CT evidence for acute infarction. Diffuse loss of parenchymal volume is consistent with atrophy. Patchy low attenuation in the deep hemispheric and periventricular white matter is nonspecific, but likely reflects chronic microvascular ischemic demyelination. Vascular: No hyperdense vessel or unexpected calcification. Skull: No evidence for fracture. No worrisome lytic or sclerotic lesion. Sinuses/Orbits: The visualized paranasal sinuses are  clear. Fluid is seen in posterior mastoid air cells bilaterally. Other: None. IMPRESSION: 1. No acute intracranial abnormality. 2. Atrophy with chronic small vessel white matter ischemic disease. 3. Bilateral mastoid effusions. Electronically Signed   By: Misty Stanley M.D.   On: 12/31/2020 18:45   DG Chest Port 1 View  Result Date: 12/31/2020 CLINICAL DATA:  Questionable sepsis. EXAM: PORTABLE CHEST 1 VIEW COMPARISON:  02/06/2021 FINDINGS: 1726 hours. The cardio pericardial silhouette is enlarged. There is pulmonary vascular congestion without overt pulmonary edema. Right IJ central line tip overlies the SVC/RA junction. Hiatal hernia noted. Interstitial markings are diffusely coarsened with chronic features. Telemetry leads overlie the chest. IMPRESSION: Cardiomegaly with vascular congestion. Hiatal hernia. Electronically Signed   By: Misty Stanley M.D.   On: 12/31/2020 18:42    Procedures Procedures    Medications Ordered in ED Medications  cefTRIAXone (ROCEPHIN) 1 g in sodium chloride 0.9 % 100 mL IVPB (0 g Intravenous Duplicate 03/06/90 5056)  heparin injection 5,000 Units (5,000 Units Subcutaneous Given 12/31/20 2319)  acetaminophen (TYLENOL) tablet 650 mg (650 mg Oral Given 12/31/20 2318)  cefTRIAXone (ROCEPHIN) 1 g in sodium chloride 0.9 % 100 mL IVPB (0 g Intravenous Stopped 12/31/20 2126)    ED Course  I have reviewed the triage vital signs and the nursing notes.  Pertinent labs & imaging results that were available during my care of the patient were reviewed by me and considered in my medical decision making (see chart for details).    MDM Rules/Calculators/A&P                         patient here for evaluation of fever, confusion for the last 24 to 48 hours. She is non-toxic appearing on evaluation with generalized weakness. UA is concerning for UTI. CBC was significant leukocytosis. She was treated with antibiotics for UTI. Presentation is not consistent with meningitis. Patient  and daughter updated findings of studies recommendation for admission and they are in agreement treatment plan.  Final Clinical Impression(s) / ED Diagnoses Final diagnoses:  Acute UTI    Rx / DC Orders ED Discharge Orders     None        Quintella Reichert, MD 01/01/21 0017

## 2020-12-31 NOTE — H&P (Signed)
History and Physical  [name] XBJ:[id number] DOB: Jul 22, 1945 DOA: 12/31/2020  Referring physician: Dr. Ralene Bathe, [place] PCP: Lajean Manes, MD  Outpatient Specialists: Nephrology Patient coming from: Home, lives with her daughter who is her primary caretaker.  Chief Complaint: Generalized weakness  HPI: [name] is a 75 y.o. female with medical history significant for ESRD on HD TTS, severe aortic stenosis, HFpEF 55 to 60%, paroxysmal A. fib on Eliquis, left hip fracture s/p repair in December 2021, who presented to [facility] ED from home due to generalized weakness, associated with 1 episode of nausea and vomiting yesterday and chills after hemodialysis the day of presentation.  Reported slurriness of speech around noon the day before her presentation.  Recurred today after hemodialysis, per her daughter at bedside.  She felt quite weak yesterday with decreasing oral intake.  After hemodialysis today she was weaker and minimally interactive.  Patient still makes urine and is incontinent of urine.  Her Depends is changed about 2-3 times per day.  EMS was activated.  On initial assessment she was lethargic not following commands.  Reported fever with temperature of 101.5, she was given 1 g of Tylenol by EMS.  Work-up in the ED revealed UA positive for pyuria, was started on Rocephin empirically.  CT head was unremarkable for any acute intracranial findings.  TRH, hospitalist team, was asked to admit.  ED Course:  Temperature 100 F.  BP 132/52, pulse 72, respiratory 16, O2 saturation 99% on 2 L.  Lab studies remarkable for serum sodium 133, potassium 3.1, glucose 115, BUN 10, creatinine 2.55.  Ammonia 17.  Lactic acid 1.6.  WBC 20.3, hemoglobin 9.2, platelet count 267.  Review of Systems: Review of systems as noted in the HPI. All other systems reviewed and are negative.   Past Medical History:  Diagnosis Date   Arthritis of left knee    Cardiomyopathy ([place])    a. h/o LV dysfunction EF 20-25%  in 2013 due to sepsis.>> improved to normal    Chronic diastolic CHF (congestive heart failure) ([place])    10/ 2013 in setting of septic shock   Complication of anesthesia    use a little anesthesia , per patient MD states she quit breathing (2016); hard to wake up   ESRD (end stage renal disease) ([place])    dialysis Tues, Thurs, Sat [address], sees dr deterding    GERD (gastroesophageal reflux disease)    Glaucoma    both eyes   H/O hiatal hernia    a. CT 2017: large gastric hiatal hernia.   Headache(784.0)    migraine hx of   History of blood transfusion 04/13/2015    History of echocardiogram    a. Echo 6/17: EF 60-65%, normal wall motion, mild MR, atrial septal lipomatous hypertrophy, PASP 34 mmHg, possible trivial free-flowing pericardial effusion along RV free wall // b. Echo 5/17: Mild LVH, EF 55-60%, normal wall motion, grade 1 diastolic dysfunction, trivial MR, severe LAE, mild RAE, PASP 42 mmHg   History of kidney stones    10/18/2019: per patient "has a couple currently one in each kidney"   History of nephrostomy 04/11/2015   currently inplace 04/28/2015  removed now   History of nuclear stress test    a. Myoview 1/14 - Marked ischemia in the basal anterior, mid anterior, apical septal and apical inferior regions, EF 63% >> LHC normal    Hyperlipidemia    Hypertension    medication removed from regimen due to low blood  pressure    Iron deficiency    hx   Myocardial infarction [facility]) 2013   10/18/2019: per patient "in 2013)   Nephrolithiasis 2002, 2006   bilateral   Normal coronary arteries 2014   a. LHC in 1/14: normal coronary arteries   PAF (paroxysmal atrial fibrillation) ([place])    a. 10/ 2013  in setting of Septic Shock //  b. recurrent during admit for pneumonia, L effusion >> placed on Amiodarone // Coumadin for anticoagulation   Pneumonia jan 2018, last tme lungs clear now   dx 10-06-2014 per CXR--  on 10-27-2014 pt states finished antibiotic and denies cough or  fever   Primary localized osteoarthritis of right hip 10/22/2019   S/P hemodialysis catheter insertion ([place]) 04/11/2015    right anterior chest , only used once    Sigmoid diverticulosis    SOB (shortness of breath) on exertion    10/18/2019: per patient "get short of breath with activity sometimes due to heart valve"   UTI (urinary tract infection) 05/10/2016   Past Surgical History:  Procedure Laterality Date   AV FISTULA PLACEMENT Left 06/02/2015   Procedure: BRACHIOCEPHALIC ARTERIOVENOUS (AV) FISTULA CREATION ;  Surgeon: Conrad Ingleside, MD;  Location: [place];  Service: Vascular;  Laterality: Left;   [place] Left 07/27/2015   Procedure: FIRST STAGE BASILIC VEIN TRANSPOSITION LEFT UPPER ARM;  Surgeon: Conrad Warren, MD;  Location: [place];  Service: Vascular;  Laterality: Left;   [place] Left 09/2015   second phase   [place] Left 10/12/2015   Procedure: SECOND STAGE BASILIC VEIN TRANSPOSITION LEFT ARM;  Surgeon: Conrad Lathrop, MD;  Location: [place];  Service: Vascular;  Laterality: Left;   BREAST BIOPSY Left 08/23/07   benign fibrocystic with duct ectasia   CARDIAC CATHETERIZATION  07-11-2012  dr Irish Lack   Abnormal stress test/   normal coronary arteries/  LVEDP  50mmHg   CARDIOVASCULAR STRESS TEST  06-26-2012  dr Irish Lack   marked ischemia in the basal anterior, mid anterior, apical inferior regions/  normal LVF, ef 63%   CATARACT EXTRACTION W/ INTRAOCULAR LENS  IMPLANT, BILATERAL     COLONOSCOPY WITH PROPOFOL N/A 10/17/2016   Procedure: COLONOSCOPY WITH PROPOFOL;  Surgeon: Garlan Fair, MD;  Location: [facility];  Service: Endoscopy;  Laterality: N/A;   CYSTOSCOPY W/ URETERAL STENT PLACEMENT  04/04/2012   Procedure: CYSTOSCOPY WITH RETROGRADE PYELOGRAM/URETERAL STENT PLACEMENT;  Surgeon: Ailene Rud, MD;  Location: [place];  Service: Urology;  Laterality: Left;   CYSTOSCOPY W/ URETERAL STENT PLACEMENT Bilateral 05/04/2015    Procedure: CYSTOSCOPY WITH BILATERAL RETROGRADE PYELOGRAM/ WITH INTERPRETATION, EXCHANGE OF RIGHT URETERAL STENT REPLACEMENT AND PLACEMENT LEFT URETERAL STENT PLACEMENT EXAMINATION OF VAGINA;  Surgeon: Carolan Clines, MD;  Location: [facility];  Service: Urology;  Laterality: Bilateral;   CYSTOSCOPY WITH STENT PLACEMENT Right 10/28/2014   Procedure: RIGHT URETERAL STENT PLACEMENT;  Surgeon: Irine Seal, MD;  Location[facility];  Service: Urology;  Laterality: Right;   CYSTOSCOPY WITH STENT PLACEMENT Right 02/26/2015   Procedure: CYSTOSCOPY RETROGRADE PYELOGRAM WITH STENT PLACEMENT;  Surgeon: Cleon Gustin, MD;  Location: [facility];  Service: Urology;  Laterality: Right;   CYSTOSCOPY/RETROGRADE/URETEROSCOPY/STONE EXTRACTION WITH BASKET Right 11/21/2014   Procedure: CYSTOSCOPY/RIGHT RETROGRADE PYELOGRAM/RIGHT URETEROSCOPY/BASKET EXTRACTION/RIGHT PYELOSCOPY/LASER OF STONE/RIGHT DOUBLE J STENT;  Surgeon: Carolan Clines, MD;  Location: [place];  Service: Urology;  Laterality: Right;   ESOPHAGOGASTRODUODENOSCOPY (EGD) WITH PROPOFOL Left 08/25/2018   Procedure:  ESOPHAGOGASTRODUODENOSCOPY (EGD) WITH PROPOFOL;  Surgeon: Ronald Lobo, MD;  Location: [place];  Service: Endoscopy;  Laterality: Left;   EXTRACORPOREAL SHOCK WAVE LITHOTRIPSY  05-28-2012  &  10-08-2012   HEMATOMA EVACUATION Left 10/27/2020   Procedure: EVACUATION HEMATOMA, repair of Pseudoaneurysm;  Surgeon: Waynetta Sandy, MD;  Location: [place];  Service: Vascular;  Laterality: Left;   HIP ARTHROPLASTY Left 06/09/2020   Procedure: ARTHROPLASTY BIPOLAR HIP (HEMIARTHROPLASTY);  Surgeon: Marchia Bond, MD;  Location: [place];  Service: Orthopedics;  Laterality: Left;   HOLMIUM LASER APPLICATION Right 09/20/3534   Procedure: HOLMIUM LASER APPLICATION;  Surgeon: Carolan Clines, MD;  Location: [facility];  Service: Urology;  Laterality: Right;   IR GENERIC HISTORICAL  02/01/2016   IR  NEPHROSTOMY EXCHANGE RIGHT 02/01/2016 Greggory Keen, MD [facility]   IR GENERIC HISTORICAL  02/24/2016   IR PATIENT EVAL TECH 0-60 MINS 02/24/2016 Aletta Edouard, MD [facility]   KNEE ARTHROSCOPY Left 02-14-2003   LAPAROSCOPIC CHOLECYSTECTOMY  03-23-2005   TOTAL ABDOMINAL HYSTERECTOMY W/ BILATERAL SALPINGOOPHORECTOMY  1993   secondary to fibroids   TOTAL KNEE ARTHROPLASTY Left 10/22/2019   TOTAL KNEE ARTHROPLASTY Left 10/22/2019   Procedure: TOTAL KNEE ARTHROPLASTY;  Surgeon: Marchia Bond, MD;  Location: [place];  Service: Orthopedics;  Laterality: Left;   TRANSTHORACIC ECHOCARDIOGRAM  04-09-2012   normal LVF,  ef 60-65%,  mild LAE,  mild TR, trivial MR and PR    Social History:  reports that she has never smoked. She has never used smokeless tobacco. She reports that she does not drink alcohol and does not use drugs.   Allergies  Allergen Reactions   Astemizole Nausea And Vomiting   Fluorouracil Rash   Vicodin [Hydrocodone-Acetaminophen] Nausea And Vomiting   Chlorhexidine Rash    Sunburn    rash   Percocet [Oxycodone-Acetaminophen] Nausea And Vomiting    Family History  Problem Relation Age of Onset   Hypertension Mother    Cancer Mother 6       breast   Dementia Mother    Hypertension Brother    Diabetes Brother    Heart disease Brother        before age 50   Cancer Father 68       pancreatic   Heart failure Paternal Grandmother    Bladder Cancer Maternal Grandfather       Prior to Admission medications   Medication Sig Start Date End Date Taking? Authorizing Provider  amiodarone (PACERONE) 200 MG tablet Take 1 tablet (200 mg total) by mouth daily. 08/24/20   Jettie Booze, MD  apixaban (ELIQUIS) 2.5 MG TABS tablet Take 1 tablet (2.5 mg total) by mouth 2 (two) times daily. 09/14/20   Jettie Booze, MD  cephALEXin (KEFLEX) 250 MG capsule Take 250 mg by mouth daily. 05/24/20   [provider]  gabapentin (NEURONTIN) 600 MG tablet Take 0.5 tablets  (300 mg total) by mouth at bedtime. 12/02/20   Love, Ivan Anchors, PA-C  heparin 1000 unit/mL SOLN injection Heparin Sodium (Porcine) 1,000 Units/mL Catheter Lock Arterial 10/24/20 10/23/21  [provider]  multivitamin (RENA-VIT) TABS tablet Take 1 tablet by mouth at bedtime. 07/27/19   [provider]  polycarbophil (FIBERCON) 625 MG tablet Take 1 tablet (625 mg total) by mouth 2 (two) times daily before lunch and supper. 12/02/20   Love, Ivan Anchors, PA-C  saccharomyces boulardii (FLORASTOR) 250 MG capsule Take 1 capsule (250 mg total) by mouth 2 (two) times daily. 07/08/15  Donne Hazel, MD    Physical Exam: BP (!) 122/36   Pulse 74   Temp 100 F (37.8 C) (Rectal)   Resp 20   LMP 01/19/1992 (Approximate)   SpO2 100%   General: 75 y.o. year-old female well developed well nourished in no acute distress.  Alert and oriented x3. Cardiovascular: Regular rate and rhythm with no rubs or gallops.  No thyromegaly or JVD noted.  Trace lower extremity edema bilaterally. Respiratory: Clear to auscultation with no wheezes or rales. Good inspiratory effort. Abdomen: Soft nontender nondistended with normal bowel sounds x4 quadrants. Muskuloskeletal: No cyanosis or clubbing.  Trace lower extremity edema bilaterally. Neuro: CN II-XII intact, strength, sensation, reflexes Skin: No ulcerative lesions noted or rashes Psychiatry: Judgement and insight appear normal. Mood is appropriate for condition and setting          Labs on Admission:  Basic Metabolic Panel: Recent Labs  Lab 12/31/20 1714  NA 133*  K 3.1*  CL 92*  CO2 31  GLUCOSE 115*  BUN 10  CREATININE 2.55*  CALCIUM 7.8*   Liver Function Tests: Recent Labs  Lab 12/31/20 1714  AST 13*  ALT 10  ALKPHOS 87  BILITOT 0.5  PROT 4.6*  ALBUMIN 1.8*   No results for input(s): LIPASE, AMYLASE in the last 168 hours. Recent Labs  Lab 12/31/20 1715  AMMONIA 17   CBC: Recent Labs  Lab 12/31/20 1714  WBC 20.3*  NEUTROABS  18.5*  HGB 9.2*  HCT 29.0*  MCV 98.0  PLT 267   Cardiac Enzymes: No results for input(s): CKTOTAL, CKMB, CKMBINDEX, TROPONINI in the last 168 hours.  BNP (last 3 results) Recent Labs    06/08/20 0525  BNP 2,447.3*    ProBNP (last 3 results) No results for input(s): PROBNP in the last 8760 hours.  CBG: No results for input(s): GLUCAP in the last 168 hours.  Radiological Exams on Admission: CT Head Wo Contrast  Result Date: 12/31/2020 CLINICAL DATA:  Mental status changes. EXAM: CT HEAD WITHOUT CONTRAST TECHNIQUE: Contiguous axial images were obtained from the base of the skull through the vertex without intravenous contrast. COMPARISON:  06/07/2020 FINDINGS: Brain: There is no evidence for acute hemorrhage, hydrocephalus, mass lesion, or abnormal extra-axial fluid collection. No definite CT evidence for acute infarction. Diffuse loss of parenchymal volume is consistent with atrophy. Patchy low attenuation in the deep hemispheric and periventricular white matter is nonspecific, but likely reflects chronic microvascular ischemic demyelination. Vascular: No hyperdense vessel or unexpected calcification. Skull: No evidence for fracture. No worrisome lytic or sclerotic lesion. Sinuses/Orbits: The visualized paranasal sinuses are clear. Fluid is seen in posterior mastoid air cells bilaterally. Other: None. IMPRESSION: 1. No acute intracranial abnormality. 2. Atrophy with chronic small vessel white matter ischemic disease. 3. Bilateral mastoid effusions. Electronically Signed   By: Misty Stanley M.D.   On: 12/31/2020 18:45   DG Chest Port 1 View  Result Date: 12/31/2020 CLINICAL DATA:  Questionable sepsis. EXAM: PORTABLE CHEST 1 VIEW COMPARISON:  02/06/2021 FINDINGS: 1726 hours. The cardio pericardial silhouette is enlarged. There is pulmonary vascular congestion without overt pulmonary edema. Right IJ central line tip overlies the SVC/RA junction. Hiatal hernia noted. Interstitial markings are  diffusely coarsened with chronic features. Telemetry leads overlie the chest. IMPRESSION: Cardiomegaly with vascular congestion. Hiatal hernia. Electronically Signed   By: Misty Stanley M.D.   On: 12/31/2020 18:42    EKG: I independently viewed the EKG done and my findings are as followed: Sinus rhythm  rate of 83.  Nonspecific ST-T changes.  QTc 501.  Assessment/Plan Present on Admission: **None**  Active Problems:   Weakness  Generalized weakness, suspect multifactorial secondary to presumptive UTI versus others. Reported slurring of speech around noon the day before her presentation.  Recurred today after hemodialysis. At the time of this visit she is back to her baseline per her daughter at bedside. UA positive for pyuria Started on Rocephin in the ED, continue Follow urine culture for ID and sensitivity. PT OT assessment Fall precaution  Anemia of chronic disease/heme positivity with no reported GI bleed Hemoglobin 9.2 on presentation from 12.7 on 12/01/2020. No reported GI bleed Monitor for now  Electrolyte disturbances Serum sodium 133, potassium 3.1 Electrolytes managed with hemodialysis.  Severe aortic valve stenosis, follows with cardiology Closely monitor on telemetry Recommend close cardiology follow-up at discharge  Paroxysmal A. fib on Eliquis Resume home amiodarone and Eliquis Monitor on telemetry  Prolonged QTC Twelve-lead EKG on admission shows QTC of 501 Avoid QTC prolonging agents    DVT prophylaxis: Eliquis  Code Status: Full code  Family Communication: Daughter at bedside  Disposition Plan: Admit to telemetry medical  Consults called: None  Admission status: Observation status.   Status is: Observation    Dispo:  Patient From: Home  Planned Disposition: Home, possibly on 01/01/2021 or when symptomatology has improved.  Medically stable for discharge: No      Kayleen Memos MD [facility] Pager [phone]  If 7PM-7AM,  please contact night-coverage [email or web address] Password [facility]  12/31/2020, 9:55 PM

## 2020-12-31 NOTE — ED Triage Notes (Addendum)
Pt went to dialysis this am, came home feeling ill, weak, lethargic. Family reports to EMS pt had episode of delirium and inability to follow commands and is jaundiced and pale. Pt A&Ox4 with EMS. Hx of UTIs. Temp of 101.5 with EMS. EMS administered 1g tylenol.

## 2020-12-31 NOTE — ED Notes (Signed)
Pt returned from CT °

## 2020-12-31 NOTE — ED Notes (Signed)
Patient transported to CT 

## 2020-12-31 NOTE — ED Notes (Signed)
Called to give report, RN in another pt's room, will call back in 5 minutes

## 2021-01-01 ENCOUNTER — Encounter (HOSPITAL_COMMUNITY): Payer: Self-pay | Admitting: Internal Medicine

## 2021-01-01 ENCOUNTER — Other Ambulatory Visit: Payer: Self-pay

## 2021-01-01 DIAGNOSIS — A419 Sepsis, unspecified organism: Secondary | ICD-10-CM | POA: Diagnosis not present

## 2021-01-01 DIAGNOSIS — E871 Hypo-osmolality and hyponatremia: Secondary | ICD-10-CM | POA: Diagnosis present

## 2021-01-01 DIAGNOSIS — I472 Ventricular tachycardia: Secondary | ICD-10-CM | POA: Diagnosis present

## 2021-01-01 DIAGNOSIS — I350 Nonrheumatic aortic (valve) stenosis: Secondary | ICD-10-CM | POA: Diagnosis present

## 2021-01-01 DIAGNOSIS — Z1612 Extended spectrum beta lactamase (ESBL) resistance: Secondary | ICD-10-CM | POA: Diagnosis present

## 2021-01-01 DIAGNOSIS — K921 Melena: Secondary | ICD-10-CM | POA: Diagnosis present

## 2021-01-01 DIAGNOSIS — I5042 Chronic combined systolic (congestive) and diastolic (congestive) heart failure: Secondary | ICD-10-CM | POA: Diagnosis present

## 2021-01-01 DIAGNOSIS — I471 Supraventricular tachycardia: Secondary | ICD-10-CM | POA: Diagnosis present

## 2021-01-01 DIAGNOSIS — I219 Acute myocardial infarction, unspecified: Secondary | ICD-10-CM | POA: Diagnosis present

## 2021-01-01 DIAGNOSIS — B964 Proteus (mirabilis) (morganii) as the cause of diseases classified elsewhere: Secondary | ICD-10-CM | POA: Diagnosis present

## 2021-01-01 DIAGNOSIS — I480 Paroxysmal atrial fibrillation: Secondary | ICD-10-CM | POA: Diagnosis present

## 2021-01-01 DIAGNOSIS — Z20822 Contact with and (suspected) exposure to covid-19: Secondary | ICD-10-CM | POA: Diagnosis present

## 2021-01-01 DIAGNOSIS — N186 End stage renal disease: Secondary | ICD-10-CM | POA: Diagnosis present

## 2021-01-01 DIAGNOSIS — D62 Acute posthemorrhagic anemia: Secondary | ICD-10-CM | POA: Diagnosis present

## 2021-01-01 DIAGNOSIS — R5381 Other malaise: Secondary | ICD-10-CM | POA: Diagnosis not present

## 2021-01-01 DIAGNOSIS — I132 Hypertensive heart and chronic kidney disease with heart failure and with stage 5 chronic kidney disease, or end stage renal disease: Secondary | ICD-10-CM | POA: Diagnosis present

## 2021-01-01 DIAGNOSIS — E8809 Other disorders of plasma-protein metabolism, not elsewhere classified: Secondary | ICD-10-CM | POA: Diagnosis present

## 2021-01-01 DIAGNOSIS — D631 Anemia in chronic kidney disease: Secondary | ICD-10-CM | POA: Diagnosis present

## 2021-01-01 DIAGNOSIS — K529 Noninfective gastroenteritis and colitis, unspecified: Secondary | ICD-10-CM | POA: Diagnosis present

## 2021-01-01 DIAGNOSIS — N390 Urinary tract infection, site not specified: Secondary | ICD-10-CM | POA: Diagnosis present

## 2021-01-01 DIAGNOSIS — R634 Abnormal weight loss: Secondary | ICD-10-CM | POA: Diagnosis present

## 2021-01-01 DIAGNOSIS — R531 Weakness: Secondary | ICD-10-CM | POA: Diagnosis present

## 2021-01-01 DIAGNOSIS — A4151 Sepsis due to Escherichia coli [E. coli]: Secondary | ICD-10-CM | POA: Diagnosis present

## 2021-01-01 DIAGNOSIS — Z992 Dependence on renal dialysis: Secondary | ICD-10-CM | POA: Diagnosis not present

## 2021-01-01 DIAGNOSIS — J9811 Atelectasis: Secondary | ICD-10-CM | POA: Diagnosis present

## 2021-01-01 DIAGNOSIS — E8889 Other specified metabolic disorders: Secondary | ICD-10-CM | POA: Diagnosis present

## 2021-01-01 DIAGNOSIS — G9341 Metabolic encephalopathy: Secondary | ICD-10-CM | POA: Diagnosis present

## 2021-01-01 LAB — RENAL FUNCTION PANEL
Albumin: 1.6 g/dL — ABNORMAL LOW (ref 3.5–5.0)
Anion gap: 7 (ref 5–15)
BUN: 13 mg/dL (ref 8–23)
CO2: 32 mmol/L (ref 22–32)
Calcium: 7.9 mg/dL — ABNORMAL LOW (ref 8.9–10.3)
Chloride: 95 mmol/L — ABNORMAL LOW (ref 98–111)
Creatinine, Ser: 2.93 mg/dL — ABNORMAL HIGH (ref 0.44–1.00)
GFR, Estimated: 16 mL/min — ABNORMAL LOW (ref >60)
Glucose, Bld: 83 mg/dL (ref 70–99)
Phosphorus: 2.9 mg/dL (ref 2.5–4.6)
Potassium: 3.1 mmol/L — ABNORMAL LOW (ref 3.5–5.1)
Sodium: 134 mmol/L — ABNORMAL LOW (ref 135–145)

## 2021-01-01 LAB — CBC
HCT: 25.4 % — ABNORMAL LOW (ref 36.0–46.0)
Hemoglobin: 8.1 g/dL — ABNORMAL LOW (ref 12.0–15.0)
MCH: 30.9 pg (ref 26.0–34.0)
MCHC: 31.9 g/dL (ref 30.0–36.0)
MCV: 96.9 fL (ref 80.0–100.0)
Platelets: 240 10*3/uL (ref 150–400)
RBC: 2.62 MIL/uL — ABNORMAL LOW (ref 3.87–5.11)
RDW: 15.9 % — ABNORMAL HIGH (ref 11.5–15.5)
WBC: 13 10*3/uL — ABNORMAL HIGH (ref 4.0–10.5)
nRBC: 0 % (ref 0.0–0.2)

## 2021-01-01 LAB — LACTIC ACID, PLASMA: Lactic Acid, Venous: 0.9 mmol/L (ref 0.5–1.9)

## 2021-01-01 MED ORDER — SIMETHICONE 40 MG/0.6ML PO SUSP
40.0000 mg | Freq: Four times a day (QID) | ORAL | Status: DC | PRN
  Filled 2021-01-01: qty 0.6

## 2021-01-01 MED ORDER — SACCHAROMYCES BOULARDII 250 MG PO CAPS
250.0000 mg | ORAL_CAPSULE | Freq: Two times a day (BID) | ORAL | Status: DC
  Administered 2021-01-01: 250 mg via ORAL
  Filled 2021-01-01: qty 1

## 2021-01-01 MED ORDER — CALCIUM POLYCARBOPHIL 625 MG PO TABS
625.0000 mg | ORAL_TABLET | Freq: Two times a day (BID) | ORAL | Status: DC
  Administered 2021-01-01 – 2021-01-09 (×16): 625 mg via ORAL
  Filled 2021-01-01 (×18): qty 1

## 2021-01-01 MED ORDER — AMIODARONE HCL 200 MG PO TABS
200.0000 mg | ORAL_TABLET | Freq: Every day | ORAL | Status: DC
  Administered 2021-01-01 – 2021-01-09 (×8): 200 mg via ORAL
  Filled 2021-01-01 (×8): qty 1

## 2021-01-01 MED ORDER — BISMUTH SUBSALICYLATE 262 MG/15ML PO SUSP: 30.0000 mL | ORAL | Status: DC | PRN

## 2021-01-01 MED ORDER — APIXABAN 2.5 MG PO TABS
2.5000 mg | ORAL_TABLET | Freq: Two times a day (BID) | ORAL | Status: DC
  Administered 2021-01-01 – 2021-01-06 (×10): 2.5 mg via ORAL
  Filled 2021-01-01 (×10): qty 1

## 2021-01-01 MED ORDER — RENA-VITE PO TABS
1.0000 | ORAL_TABLET | Freq: Every day | ORAL | Status: DC
  Administered 2021-01-01 – 2021-01-09 (×9): 1 via ORAL
  Filled 2021-01-01 (×9): qty 1

## 2021-01-01 MED ORDER — SODIUM CHLORIDE 0.9 % IV SOLN
2.0000 g | INTRAVENOUS | Status: DC
  Administered 2021-01-01 – 2021-01-03 (×3): 2 g via INTRAVENOUS
  Filled 2021-01-01 (×3): qty 20

## 2021-01-01 MED ORDER — MAGNESIUM SULFATE 2 GM/50ML IV SOLN
2.0000 g | Freq: Once | INTRAVENOUS | Status: AC
  Administered 2021-01-01: 2 g via INTRAVENOUS
  Filled 2021-01-01: qty 50

## 2021-01-01 MED ORDER — POTASSIUM CHLORIDE CRYS ER 20 MEQ PO TBCR
40.0000 meq | EXTENDED_RELEASE_TABLET | Freq: Once | ORAL | Status: AC
  Administered 2021-01-01: 40 meq via ORAL
  Filled 2021-01-01: qty 2

## 2021-01-01 NOTE — Progress Notes (Signed)
[facility] PROGRESS NOTE    [name]  ZJQ:[id number] DOB: Jan 17, 1946 DOA: 12/31/2020 PCP: Amy Manes, MD      Brief Narrative:  [name] is a 75 y.o. female with medical history significant for ESRD on HD TTS, severe aortic stenosis, HFpEF 55 to 60%, paroxysmal A. fib on Eliquis, left hip fracture s/p repair in December 2021, who presented to [facility] ED from home due to generalized weakness, associated with 1 episode of nausea and vomiting yesterday and chills after hemodialysis the day of presentation.   Has had urinary "pressure" and incontinence.  In the ER, febrile to 103F, WBC 20K.  Urine appeared infected and ws started on antibiotics.        Assessment & Plan:   UTI Doubt sepsis.  UA positive for pyuria, symptoms corrobrate. -Continue Rocephin -Follow urine culture   Acute metabolic encephalopathy At baseline is independent, no memory concersn.  On admission was confused, disoriented, she reports.  Mentation improving but still too weak and "out of it" to get up and care for self.   PT OT assessment   Anemia of chronic kidney disease Hgb trending down, no clinical beleding.  FOBT postiive. -Follow up FOBT with GI as outpateint    Hyponatremia Hypokalemia  NSVT Some runs of asympatomic ventricular tachycarda -Replete K -Supplement Mag   Severe aortic valve stenosis, follows with cardiology   Paroxysmal Atrial fibrillation - Continue amiodarone and Eliquis Monitor on telemetry   Prolonged QTC Twelve-lead EKG on admission shows QTC of 501 Avoid QTC prolonging agents        Disposition: Status is: Observation  The patient will require care spanning > 2 midnights and should be moved to inpatient because:  fever and altered mentation on admission, history of renal failure, advanced age and ongoing weakness too much to stably get out of bed  Dispo:  Patient From: Home  Planned Disposition: Home  Medically stable for discharge:  No      Level of care: Telemetry Medical       MDM: The below labs and imaging reports were reviewed and summarized above.  Medication management as above.   DVT prophylaxis: apixaban (ELIQUIS) tablet 2.5 mg Start: 01/01/21 1000 SCDs Start: 12/31/20 2058 apixaban (ELIQUIS) tablet 2.5 mg  Code Status: FULL           Subjective: Patient has severe gas pains.  Some loose stools overnight, no hematochezia.  She is still extremely weak, unable to get up out of bed stably.  Objective: Vitals:   01/01/21 0237 01/01/21 0730 01/01/21 1140 01/01/21 1604  BP: (!) 119/45 (!) 128/48 (!) 152/51 (!) 137/56  Pulse: 69 72 75 79  Resp: 16 17 16 17   Temp: 98.6 F (37 C) 98 F (36.7 C) 98 F (36.7 C) 97.9 F (36.6 C)  TempSrc:  Oral Oral Oral  SpO2: 99% 100% 100% 95%  Height:        Intake/Output Summary (Last 24 hours) at 01/01/2021 1607 Last data filed at 01/01/2021 0453 Gross per 24 hour  Intake 280 ml  Output --  Net 280 ml   There were no vitals filed for this visit.  Examination: General appearance: Elderly adult female, awake, appears to be in mild distress.  Due to malaise. HEENT: Anicteric, conjunctiva pink, lids and lashes normal. No nasal deformity, discharge, epistaxis.  Lips moist, dentition in good repair, oropharynx tacky dry.   Skin: Warm and dry.  no jaundice.  No suspicious rashes or lesions.  Cardiac: RRR, nl S1-S2, no murmurs appreciated.  No LE edema.  Radial  pulses 2+ and symmetric. Respiratory: Normal respiratory rate and rhythm.  CTAB without rales or wheezes. Abdomen: Abdomen soft.  Moderate nonfocal TTP with voluntary guarding. No ascites, distension, hepatosplenomegaly.   MSK: No deformities or effusions. Neuro: Awake and alert.  EOMI, moves all extremities with severe generalized weakness. Speech fluent.    Psych: Sensorium intact and responding to questions, attention normal. Affect blunted.  Judgment and insight appear somewhat  impaired.    Data Reviewed: I have personally reviewed following labs and imaging studies:  CBC: Recent Labs  Lab 12/31/20 1714 01/01/21 0342  WBC 20.3* 13.0*  NEUTROABS 18.5*  --   HGB 9.2* 8.1*  HCT 29.0* 25.4*  MCV 98.0 96.9  PLT 267 456   Basic Metabolic Panel: Recent Labs  Lab 12/31/20 1714 01/01/21 0342  NA 133* 134*  K 3.1* 3.1*  CL 92* 95*  CO2 31 32  GLUCOSE 115* 83  BUN 10 13  CREATININE 2.55* 2.93*  CALCIUM 7.8* 7.9*  PHOS  --  2.9   GFR: CrCl cannot be calculated (Unknown ideal weight.). Liver Function Tests: Recent Labs  Lab 12/31/20 1714 01/01/21 0342  AST 13*  --   ALT 10  --   ALKPHOS 87  --   BILITOT 0.5  --   PROT 4.6*  --   ALBUMIN 1.8* 1.6*   No results for input(s): LIPASE, AMYLASE in the last 168 hours. Recent Labs  Lab 12/31/20 1715  AMMONIA 17   Coagulation Profile: Recent Labs  Lab 12/31/20 1714  INR 1.8*   Cardiac Enzymes: No results for input(s): CKTOTAL, CKMB, CKMBINDEX, TROPONINI in the last 168 hours. BNP (last 3 results) No results for input(s): PROBNP in the last 8760 hours. HbA1C: No results for input(s): HGBA1C in the last 72 hours. CBG: No results for input(s): GLUCAP in the last 168 hours. Lipid Profile: No results for input(s): CHOL, HDL, LDLCALC, TRIG, CHOLHDL, LDLDIRECT in the last 72 hours. Thyroid Function Tests: No results for input(s): TSH, T4TOTAL, FREET4, T3FREE, THYROIDAB in the last 72 hours. Anemia Panel: No results for input(s): VITAMINB12, FOLATE, FERRITIN, TIBC, IRON, RETICCTPCT in the last 72 hours. Urine analysis:    Component Value Date/Time   COLORURINE YELLOW 12/31/2020 1840   APPEARANCEUR TURBID (A) 12/31/2020 1840   LABSPEC  12/31/2020 1840    TEST NOT REPORTED DUE TO COLOR INTERFERENCE OF URINE PIGMENT   PHURINE  12/31/2020 1840    TEST NOT REPORTED DUE TO COLOR INTERFERENCE OF URINE PIGMENT   GLUCOSEU (A) 12/31/2020 1840    TEST NOT REPORTED DUE TO COLOR INTERFERENCE OF URINE  PIGMENT   HGBUR (A) 12/31/2020 1840    TEST NOT REPORTED DUE TO COLOR INTERFERENCE OF URINE PIGMENT   BILIRUBINUR (A) 12/31/2020 1840    TEST NOT REPORTED DUE TO COLOR INTERFERENCE OF URINE PIGMENT   BILIRUBINUR neg 09/18/2014 0920   KETONESUR (A) 12/31/2020 1840    TEST NOT REPORTED DUE TO COLOR INTERFERENCE OF URINE PIGMENT   PROTEINUR (A) 12/31/2020 1840    TEST NOT REPORTED DUE TO COLOR INTERFERENCE OF URINE PIGMENT   UROBILINOGEN 0.2 05/02/2015 2020   NITRITE (A) 12/31/2020 1840    TEST NOT REPORTED DUE TO COLOR INTERFERENCE OF URINE PIGMENT   LEUKOCYTESUR (A) 12/31/2020 1840    TEST NOT REPORTED DUE TO COLOR INTERFERENCE OF URINE PIGMENT   Sepsis Labs: @LABRCNTIP (procalcitonin:4,lacticacidven:4)  ) Recent Results (from the past  240 hour(s))  Blood Culture (routine x 2)     Status: None (Preliminary result)   Collection Time: 12/31/20  6:09 PM   Specimen: Right Antecubital; Blood  Result Value Ref Range Status   Specimen Description RIGHT ANTECUBITAL  Final   Special Requests   Final    BOTTLES DRAWN AEROBIC AND ANAEROBIC Blood Culture adequate volume   Culture   Final    NO GROWTH < 12 HOURS Performed at [facility], [place] [address], [place], [place] [address]    Report Status PENDING  Incomplete  Resp Panel by RT-PCR (Flu A&B, Covid) Nasopharyngeal Swab     Status: None   Collection Time: 12/31/20  6:16 PM   Specimen: Nasopharyngeal Swab; Nasopharyngeal(NP) swabs in vial transport medium  Result Value Ref Range Status   SARS Coronavirus 2 by RT PCR NEGATIVE NEGATIVE Final    Comment: (NOTE) SARS-CoV-2 target nucleic acids are NOT DETECTED.  The SARS-CoV-2 RNA is generally detectable in upper respiratory specimens during the acute phase of infection. The lowest concentration of SARS-CoV-2 viral copies this assay can detect is 138 copies/mL. A negative result does not preclude SARS-Cov-2 infection and should not be used as the sole basis for treatment or other  patient management decisions. A negative result may occur with  improper specimen collection/handling, submission of specimen other than nasopharyngeal swab, presence of viral mutation(s) within the areas targeted by this assay, and inadequate number of viral copies(<138 copies/mL). A negative result must be combined with clinical observations, patient history, and epidemiological information. The expected result is Negative.  Fact Sheet for Patients:  EntrepreneurPulse.com.au  Fact Sheet for Healthcare Providers:  IncredibleEmployment.be  This test is no t yet approved or cleared by the [place] FDA and  has been authorized for detection and/or diagnosis of SARS-CoV-2 by FDA under an Emergency Use Authorization (EUA). This EUA will remain  in effect (meaning this test can be used) for the duration of the COVID-19 declaration under Section 564(b)(1) of the Act, 21 U.S.C.section 360bbb-3(b)(1), unless the authorization is terminated  or revoked sooner.       Influenza A by PCR NEGATIVE NEGATIVE Final   Influenza B by PCR NEGATIVE NEGATIVE Final    Comment: (NOTE) The Xpert Xpress SARS-CoV-2/FLU/RSV plus assay is intended as an aid in the diagnosis of influenza from Nasopharyngeal swab specimens and should not be used as a sole basis for treatment. Nasal washings and aspirates are unacceptable for Xpert Xpress SARS-CoV-2/FLU/RSV testing.  Fact Sheet for Patients: EntrepreneurPulse.com.au  Fact Sheet for Healthcare Providers: IncredibleEmployment.be  This test is not yet approved or cleared by the [place] FDA and has been authorized for detection and/or diagnosis of SARS-CoV-2 by FDA under an Emergency Use Authorization (EUA). This EUA will remain in effect (meaning this test can be used) for the duration of the COVID-19 declaration under Section 564(b)(1) of the Act, 21 U.S.C. section  360bbb-3(b)(1), unless the authorization is terminated or revoked.  Performed at [facility], [place] [address] [place], [place] [address]   Blood Culture (routine x 2)     Status: None (Preliminary result)   Collection Time: 12/31/20  6:50 PM   Specimen: BLOOD LEFT FOREARM  Result Value Ref Range Status   Specimen Description BLOOD LEFT FOREARM  Final   Special Requests   Final    BOTTLES DRAWN AEROBIC AND ANAEROBIC Blood Culture results may not be optimal due to an inadequate volume of blood received in culture  bottles   Culture   Final    NO GROWTH < 12 HOURS Performed at [facility], [place] [address] [place], [place] [address]    Report Status PENDING  Incomplete         Radiology Studies: CT Head Wo Contrast  Result Date: 12/31/2020 CLINICAL DATA:  Mental status changes. EXAM: CT HEAD WITHOUT CONTRAST TECHNIQUE: Contiguous axial images were obtained from the base of the skull through the vertex without intravenous contrast. COMPARISON:  06/07/2020 FINDINGS: Brain: There is no evidence for acute hemorrhage, hydrocephalus, mass lesion, or abnormal extra-axial fluid collection. No definite CT evidence for acute infarction. Diffuse loss of parenchymal volume is consistent with atrophy. Patchy low attenuation in the deep hemispheric and periventricular white matter is nonspecific, but likely reflects chronic microvascular ischemic demyelination. Vascular: No hyperdense vessel or unexpected calcification. Skull: No evidence for fracture. No worrisome lytic or sclerotic lesion. Sinuses/Orbits: The visualized paranasal sinuses are clear. Fluid is seen in posterior mastoid air cells bilaterally. Other: None. IMPRESSION: 1. No acute intracranial abnormality. 2. Atrophy with chronic small vessel white matter ischemic disease. 3. Bilateral mastoid effusions. Electronically Signed   By: Misty Stanley M.D.   On: 12/31/2020 18:45   DG Chest Port 1 View  Result Date:  12/31/2020 CLINICAL DATA:  Questionable sepsis. EXAM: PORTABLE CHEST 1 VIEW COMPARISON:  02/06/2021 FINDINGS: 1726 hours. The cardio pericardial silhouette is enlarged. There is pulmonary vascular congestion without overt pulmonary edema. Right IJ central line tip overlies the SVC/RA junction. Hiatal hernia noted. Interstitial markings are diffusely coarsened with chronic features. Telemetry leads overlie the chest. IMPRESSION: Cardiomegaly with vascular congestion. Hiatal hernia. Electronically Signed   By: Misty Stanley M.D.   On: 12/31/2020 18:42        Scheduled Meds:  amiodarone  200 mg Oral Daily   apixaban  2.5 mg Oral BID   multivitamin  1 tablet Oral QHS   polycarbophil  625 mg Oral BID AC   potassium chloride  40 mEq Oral Once   Continuous Infusions:  cefTRIAXone (ROCEPHIN)  IV 2 g (01/01/21 1034)   magnesium sulfate bolus IVPB       LOS: 0 days    Time spent: 25 minutes    Edwin Dada, MD [facility] 01/01/2021, 4:07 PM     Please page though [facility] or Epic secure chat:  For [employer], Adult nurse

## 2021-01-01 NOTE — Evaluation (Signed)
Physical Therapy Evaluation Patient Details Name: [name] MRN: [id number] DOB: 26-Jan-1946 Today's Date: 01/01/2021   History of Present Illness  The pt is a 75 y.o. female  presenting 7/14 with c/o weakness, lethargy, and delirium following HD session earlier in the day. Upon workup, pt found to have UTI. PMH includes: recent admission for LUE hematoma with concern for compartment syndrome, arthritis, cardiomyopathy, CHF, ESRD, GERD, HLD, HTN, MI, a fib, L THA and L TKA.   Clinical Impression  Pt in bed upon arrival of PT, agreeable to evaluation at this time. Prior to admission the pt was completing lateral scoot transfers with daughter's assist and use of slide board. The pt now presents with limitations in functional mobility, strength, ROM, power, and activity tolerance due to above dx, and will continue to benefit from skilled PT to address these deficits. The pt was able to complete more bed mobility and initial transition to sitting than she originally thought. Was given increased time and max encouragement, as well as cues for technique and the pt was then able to complete with minA. The pt was then able to maintain static sitting EOB without physical assist, and complete lateral scoot transfers along EOB with minA. The pt declined further attempts at mobility due to pain and fatigue, will benefit from continued skilled PT to progress functional strength, stability, and decreased assist needed for OOB mobility and transfers. I feel the pt could benefit from increased volume of therapy at SNF for short term, but would need max HHPT therapies if pt and her daughter prefer return home.      Follow Up Recommendations SNF;Supervision/Assistance - 24 hour    Equipment Recommendations  Other (comment) (drop arm BSC)    Recommendations for Other Services       Precautions / Restrictions Precautions Precautions: Fall Precaution Comments: incontinent of urine and BM Restrictions Weight  Bearing Restrictions: No      Mobility  Bed Mobility Overal bed mobility: Needs Assistance Bed Mobility: Rolling;Sidelying to Sit;Sit to Sidelying Rolling: Min assist Sidelying to sit: Min assist;+2 for physical assistance       General bed mobility comments: minA to complete roll, pt able to demo good initiation of movement. minA to complete trunk elevation and movement of BLE off EOB.    Transfers Overall transfer level: Needs assistance Equipment used: None Transfers: Lateral/Scoot Transfers          Lateral/Scoot Transfers: Min assist;+2 safety/equipment General transfer comment: pt able to scoot laterally along EOB with minA of  to bed pad, good hip clearance from EOB  Ambulation/Gait             General Gait Details: deferred due to pain     Balance Overall balance assessment: Needs assistance Sitting-balance support: Bilateral upper extremity supported;Feet supported Sitting balance-Leahy Scale: Poor Sitting balance - Comments: reliant on BUE support                                     Pertinent Vitals/Pain Pain Assessment: Faces Faces Pain Scale: Hurts little more Pain Location: buttocks (old pressure wound) Pain Descriptors / Indicators: Discomfort;Moaning;Sore Pain Intervention(s): Limited activity within patient's tolerance;Monitored during session;Repositioned    Home Living Family/patient expects to be discharged to:: Private residence Living Arrangements: Children Available Help at Discharge: Family;Available 24 hours/day Type of Home: House       Home Layout: One level Home Equipment: Grab  bars - tub/shower;Walker - 4 wheels;Cane - quad;Cane - single point;Wheelchair - [employer] - 2 wheels      Prior Function Level of Independence: Needs assistance   Gait / Transfers Assistance Needed: pt reports mobility with WC since last admission, has been unable to grasp RW due to weakness in LUE grip. limited to bathing in bed,  lateral scoot with slide boad for trasnfers  ADL's / Homemaking Assistance Needed: daughter assist with bathing (LB in bed, pt reports able to wash face and UE), LB dressing, and all IADLs.        Hand Dominance   Dominant Hand: Right    Extremity/Trunk Assessment   Upper Extremity Assessment Upper Extremity Assessment: Generalized weakness;Defer to OT evaluation    Lower Extremity Assessment Lower Extremity Assessment: Generalized weakness (LLE weaker than RLE, grossly 3+/5)    Cervical / Trunk Assessment Cervical / Trunk Assessment: Kyphotic  Communication   Communication: No difficulties  Cognition Arousal/Alertness: Awake/alert Behavior During Therapy: Flat affect;Anxious Overall Cognitive Status: No family/caregiver present to determine baseline cognitive functioning Area of Impairment: Safety/judgement;Problem solving                         Safety/Judgement: Decreased awareness of deficits   Problem Solving: Decreased initiation;Requires verbal cues General Comments: pt with slightly self-limiting behaviors, anxiety regarding movement. Pt initially asking for physical assist with any mobility, but able to complete good portion on her own with max encouragement. increased time and encouragement for all mobility      General Comments General comments (skin integrity, edema, etc.): VSS on RA, pt reports unable to control BM/urine with movement. pt with old sore on sacrum, RN observed and covered during session    Exercises General Exercises - Lower Extremity Ankle Circles/Pumps: AROM;Both;5 reps;Supine Long Arc Quad: AROM;Both;5 reps;Seated Heel Slides: AROM;Both;5 reps;Supine Hip Flexion/Marching: AROM;Both;5 reps;Supine   Assessment/Plan    PT Assessment Patient needs continued PT services  PT Problem List Decreased strength;Decreased range of motion;Decreased activity tolerance;Decreased balance;Decreased mobility;Pain       PT Treatment  Interventions DME instruction;Gait training;Stair training;Functional mobility training;Therapeutic activities;Therapeutic exercise;Balance training;Patient/family education    PT Goals (Current goals can be found in the Care Plan section)  Acute Rehab PT Goals Patient Stated Goal: return home PT Goal Formulation: With patient Time For Goal Achievement: 01/15/21 Potential to Achieve Goals: Fair    Frequency Min 3X/week   Barriers to discharge        Co-evaluation PT/OT/SLP Co-Evaluation/Treatment: Yes Reason for Co-Treatment: For patient/therapist safety;To address functional/ADL transfers PT goals addressed during session: Mobility/safety with mobility;Balance;Strengthening/ROM         AM-PAC PT "6 Clicks" Mobility  Outcome Measure Help needed turning from your back to your side while in a flat bed without using bedrails?: A Little Help needed moving from lying on your back to sitting on the side of a flat bed without using bedrails?: A Little Help needed moving to and from a bed to a chair (including a wheelchair)?: A Lot Help needed standing up from a chair using your arms (e.g., wheelchair or bedside chair)?: A Lot Help needed to walk in hospital room?: A Lot Help needed climbing 3-5 steps with a railing? : Total 6 Click Score: 13    End of Session   Activity Tolerance: Patient tolerated treatment well;Patient limited by fatigue;Patient limited by pain Patient left: in bed;with call bell/phone within reach;with bed alarm set Nurse Communication: Mobility status;Need for lift equipment  PT Visit Diagnosis: Other abnormalities of gait and mobility (R26.89);Muscle weakness (generalized) (M62.81);Pain    Time: 5927-6394 PT Time Calculation (min) (ACUTE ONLY): 28 min   Charges:   PT Evaluation $PT Eval Low Complexity: 1 Low          Mert Dietrick Allen Kell, PT, DPT   Acute Rehabilitation Department Pager #: [phone]  Otho Bellows 01/01/2021, 1:47 PM

## 2021-01-01 NOTE — Evaluation (Signed)
Occupational Therapy Evaluation Patient Details Name: [name] MRN: [id number] DOB: 1945/12/04 Today's Date: 01/01/2021    History of Present Illness The pt is a 75 y.o. female  presenting 7/14 with c/o weakness, lethargy, and delirium following HD session earlier in the day. Upon workup, pt found to have UTI. PMH includes: recent admission for LUE hematoma with concern for compartment syndrome, arthritis, cardiomyopathy, CHF, ESRD, GERD, HLD, HTN, MI, a fib, L THA and L TKA.   Clinical Impression   Pt presents with decline in function and safety with ADLs and ADL mobility with impaired strength, balance and endurance; pt also anxious and self limiting. PTA pt lived at home with her daughter who assists her with LB ADLs, toilet hygiene and slide board transfers to w/c. Pt has RW but has not used it n about 2 months she reports. Pt currently requires min A +2 with bed mobility to sit EOB, min guard A with UB ADLs/grooming, mod A - max A with LB ADLs; pt declined OOB to BSC/chair transfers at this time due to abdominal pain and loose tools. Pt would benefit from acute OT services to address impairments to maximize level of function and safety    Follow Up Recommendations  SNF (max HH therapies if pt and her daughter prefer return home)    Equipment Recommendations  None recommended by OT    Recommendations for Other Services       Precautions / Restrictions Precautions Precautions: Fall Precaution Comments: incontinent of urine and BM Restrictions Weight Bearing Restrictions: No      Mobility Bed Mobility Overal bed mobility: Needs Assistance Bed Mobility: Rolling;Sidelying to Sit;Sit to Sidelying Rolling: Min assist Sidelying to sit: Min assist;+2 for physical assistance       General bed mobility comments: minA to complete roll, pt able to demo good initiation of movement. minA to complete trunk elevation and movement of BLE off EOB.    Transfers Overall transfer  level: Needs assistance Equipment used: None Transfers: Lateral/Scoot Transfers          Lateral/Scoot Transfers: Min assist;+2 safety/equipment General transfer comment: pt able to scoot laterally along EOB with min A    Balance Overall balance assessment: Needs assistance Sitting-balance support: Bilateral upper extremity supported;Feet supported Sitting balance-Leahy Scale: Poor Sitting balance - Comments: reliant on BUE support                                   ADL either performed or assessed with clinical judgement   ADL Overall ADL's : Needs assistance/impaired Eating/Feeding: Set up;Independent;Sitting   Grooming: Wash/dry hands;Wash/dry face;Min guard;Sitting   Upper Body Bathing: Min guard;Sitting   Lower Body Bathing: Moderate assistance;Sitting/lateral leans   Upper Body Dressing : Min guard;Sitting   Lower Body Dressing: Moderate assistance;Sitting/lateral leans       Toileting- Clothing Manipulation and Hygiene: Total assistance;Bed level         General ADL Comments: pt declined standing/OOB transfers to recliner or BSC     Vision Baseline Vision/History: Wears glasses Wears Glasses: Reading only Patient Visual Report: No change from baseline       Perception     Praxis      Pertinent Vitals/Pain Pain Assessment: Faces Faces Pain Scale: Hurts little more Pain Location: buttocks (old pressure wound) Pain Descriptors / Indicators: Discomfort;Moaning;Sore Pain Intervention(s): Limited activity within patient's tolerance;Monitored during session;Repositioned     Hand Dominance Right  Extremity/Trunk Assessment Upper Extremity Assessment Upper Extremity Assessment: Generalized weakness   Lower Extremity Assessment Lower Extremity Assessment: Defer to PT evaluation   Cervical / Trunk Assessment Cervical / Trunk Assessment: Kyphotic   Communication Communication Communication: No difficulties   Cognition  Arousal/Alertness: Awake/alert Behavior During Therapy: Flat affect;Anxious Overall Cognitive Status: No family/caregiver present to determine baseline cognitive functioning Area of Impairment: Safety/judgement;Problem solving                         Safety/Judgement: Decreased awareness of deficits   Problem Solving: Decreased initiation;Requires verbal cues General Comments: pt with slightly self-limiting behaviors, anxiety regarding movement. Pt initially asking for physical assist with any mobility, but able to complete good portion on her own with max encouragement. increased time and encouragement for all mobility   General Comments  VSS on RA, pt reports unable to control BM/urine with movement. pt with old sore on sacrum, RN observed and covered during session    Exercises Exercises: General Lower Extremity General Exercises - Lower Extremity Ankle Circles/Pumps: AROM;Both;5 reps;Supine Long Arc Quad: AROM;Both;5 reps;Seated Heel Slides: AROM;Both;5 reps;Supine Hip Flexion/Marching: AROM;Both;5 reps;Supine   Shoulder Instructions      Home Living Family/patient expects to be discharged to:: Private residence Living Arrangements: Children Available Help at Discharge: Family;Available 24 hours/day Type of Home: House Home Access: Stairs to enter [employer] of Steps: 2 Entrance Stairs-Rails: None Home Layout: One level     Bathroom Shower/Tub: Teacher, early years/pre: Handicapped height Bathroom Accessibility: No   Home Equipment: Grab bars - tub/shower;Walker - 4 wheels;Cane - quad;Cane - single point;Wheelchair - [employer] - 2 wheels          Prior Functioning/Environment Level of Independence: Needs assistance  Gait / Transfers Assistance Needed: pt reports mobility with WC since last admission, has been unable to grasp RW due to weakness in LUE grip. limited to bathing in bed, lateral scoot with slide boad for trasnfers ADL's  / Homemaking Assistance Needed: daughter assist with bathing (LB in bed, pt reports able to wash face and UE), LB dressing, and all IADLs.            OT Problem List: Decreased strength;Impaired balance (sitting and/or standing);Pain;Decreased activity tolerance;Decreased coordination;Decreased knowledge of use of DME or AE      OT Treatment/Interventions: Self-care/ADL training;Therapeutic exercise;Patient/family education;Balance training;Therapeutic activities;DME and/or AE instruction    OT Goals(Current goals can be found in the care plan section) Acute Rehab OT Goals Patient Stated Goal: return home OT Goal Formulation: With patient Time For Goal Achievement: 01/15/21 Potential to Achieve Goals: Good ADL Goals Pt Will Perform Grooming: with supervision;with set-up;sitting Pt Will Perform Upper Body Bathing: with supervision;with set-up;sitting Pt Will Perform Lower Body Bathing: with min assist;sitting/lateral leans Pt Will Perform Upper Body Dressing: with supervision;with set-up;sitting Pt Will Transfer to Toilet: with max assist;with mod assist;stand pivot transfer;bedside commode Pt Will Perform Toileting - Clothing Manipulation and hygiene: with max assist;with mod assist;sitting/lateral leans;sit to/from stand  OT Frequency: Min 2X/week   Barriers to D/C:            Co-evaluation PT/OT/SLP Co-Evaluation/Treatment: Yes Reason for Co-Treatment: For patient/therapist safety;To address functional/ADL transfers PT goals addressed during session: Mobility/safety with mobility;Balance;Strengthening/ROM OT goals addressed during session: ADL's and self-care      AM-PAC OT "6 Clicks" Daily Activity     Outcome Measure Help from another person eating meals?: None Help from another person taking care of  personal grooming?: A Little Help from another person toileting, which includes using toliet, bedpan, or urinal?: Total Help from another person bathing (including washing,  rinsing, drying)?: A Lot Help from another person to put on and taking off regular upper body clothing?: A Little Help from another person to put on and taking off regular lower body clothing?: A Lot 6 Click Score: 15   End of Session    Activity Tolerance: Patient limited by fatigue Patient left: in bed;with call bell/phone within reach;with bed alarm set  OT Visit Diagnosis: Other abnormalities of gait and mobility (R26.89);Muscle weakness (generalized) (M62.81);Pain Pain - part of body:  (buttocks/tail bone, abdomen)                Time: 6948-5462 OT Time Calculation (min): 28 min Charges:  OT General Charges $OT Visit: 1 Visit OT Evaluation $OT Eval Moderate Complexity: 1 Mod    Britt Bottom 01/01/2021, 3:14 PM

## 2021-01-01 NOTE — Progress Notes (Signed)
Telemetry called and stated that the patient had some runs of V-tach. Lauren, RN. Received called and notified me while on break. Lauren, RN states that they took vital signs and performed a EKG on the patient and notified Physician. Patient was alert and awake. No other complaints from patient except a stomach ache from flatulence. Will continue to monitor patient.

## 2021-01-01 NOTE — Plan of Care (Signed)

## 2021-01-01 NOTE — Plan of Care (Signed)
  Problem: Nutrition: Goal: Adequate nutrition will be maintained Outcome: Progressing   Problem: Pain Managment: Goal: General experience of comfort will improve Outcome: Progressing   

## 2021-01-02 DIAGNOSIS — R531 Weakness: Secondary | ICD-10-CM | POA: Diagnosis not present

## 2021-01-02 DIAGNOSIS — N390 Urinary tract infection, site not specified: Secondary | ICD-10-CM | POA: Diagnosis not present

## 2021-01-02 LAB — CBC
HCT: 30.6 % — ABNORMAL LOW (ref 36.0–46.0)
Hemoglobin: 9.7 g/dL — ABNORMAL LOW (ref 12.0–15.0)
MCH: 30.8 pg (ref 26.0–34.0)
MCHC: 31.7 g/dL (ref 30.0–36.0)
MCV: 97.1 fL (ref 80.0–100.0)
Platelets: 279 10*3/uL (ref 150–400)
RBC: 3.15 MIL/uL — ABNORMAL LOW (ref 3.87–5.11)
RDW: 16.1 % — ABNORMAL HIGH (ref 11.5–15.5)
WBC: 17.3 10*3/uL — ABNORMAL HIGH (ref 4.0–10.5)
nRBC: 0 % (ref 0.0–0.2)

## 2021-01-02 LAB — BASIC METABOLIC PANEL
Anion gap: 12 (ref 5–15)
BUN: 21 mg/dL (ref 8–23)
CO2: 26 mmol/L (ref 22–32)
Calcium: 8.2 mg/dL — ABNORMAL LOW (ref 8.9–10.3)
Chloride: 93 mmol/L — ABNORMAL LOW (ref 98–111)
Creatinine, Ser: 3.89 mg/dL — ABNORMAL HIGH (ref 0.44–1.00)
GFR, Estimated: 11 mL/min — ABNORMAL LOW (ref >60)
Glucose, Bld: 79 mg/dL (ref 70–99)
Potassium: 4.2 mmol/L (ref 3.5–5.1)
Sodium: 131 mmol/L — ABNORMAL LOW (ref 135–145)

## 2021-01-02 LAB — MAGNESIUM: Magnesium: 2.2 mg/dL (ref 1.7–2.4)

## 2021-01-02 MED ORDER — ACETAMINOPHEN 325 MG PO TABS
ORAL_TABLET | ORAL | Status: AC
  Filled 2021-01-02: qty 2

## 2021-01-02 MED ORDER — HYDROXYZINE HCL 10 MG PO TABS
10.0000 mg | ORAL_TABLET | Freq: Four times a day (QID) | ORAL | Status: DC | PRN
  Administered 2021-01-08: 10 mg via ORAL
  Filled 2021-01-02 (×2): qty 1

## 2021-01-02 MED ORDER — CHLORHEXIDINE GLUCONATE CLOTH 2 % EX PADS
6.0000 | MEDICATED_PAD | Freq: Every day | CUTANEOUS | Status: DC
  Administered 2021-01-04 – 2021-01-09 (×5): 6 via TOPICAL

## 2021-01-02 MED ORDER — ONDANSETRON HCL 4 MG/2ML IJ SOLN
4.0000 mg | Freq: Three times a day (TID) | INTRAMUSCULAR | Status: DC | PRN
  Administered 2021-01-02: 4 mg via INTRAVENOUS
  Filled 2021-01-02: qty 2

## 2021-01-02 MED ORDER — HEPARIN SODIUM (PORCINE) 1000 UNIT/ML IJ SOLN
INTRAMUSCULAR | Status: AC
  Filled 2021-01-02: qty 4

## 2021-01-02 NOTE — Progress Notes (Signed)
PROGRESS NOTE  [name] HGD:[id number] DOB: [birth year] DOA: 12/31/2020 PCP: Amy Manes, MD  HPI/Recap of past 24 hours: [name] is a 75 y.o. female with medical history significant for ESRD on HD TTS, severe aortic stenosis, HFpEF 55 to 60%, paroxysmal A. fib on Eliquis, left hip fracture s/p repair in December 2021, who presented to [facility] ED from home on December 31, 2020 due to generalized weakness, associated with 1 episode of nausea and vomiting and chills after hemodialysis the day of presentation.  In the ER patient was said to have had urinary pressure and she was febrile with temperature of 103 and leukocytosis of 20,000.  And she was started on IV Rocephin antibiotics and admitted to inpatient with telemetry    January 02, 2021: Patient seen and examined: Yesterday patient had a run of symptomatic V. tach. Patient is complaining of nausea     Assessment/Plan: Active Problems:   Weakness   UTI (urinary tract infection)  #1 urinary tract infection. UA was positive for pyuria.  Patient had fever but that has resolved Urine culture is showing E. coli and Proteus Mirabella's. Continue ceftriaxone  2.  Acute metabolic encephalopathy She was confused on admission but she is not oriented but she still feeling generally weak PT OT to assess 's #3 anemia of chronic disease due to chronic kidney disease No acute bleeding FOBT was positive. She will follow-up with GI as outpatient  3.  End-stage renal disease.  She gets hemodialysis on Tuesday Thursday and Saturday I have consulted with nephrology for hemodialysis today if possible  4.  Mild hyponatremia continue IV hydration  5.  Paroxysmal atrial fibrillation. Continue amiodarone and Eliquis Continue telemetry monitoring  6.  Nonsustained ventricular tachycardia.  Last night.  There is been no recurrence. Potassium was repleted as well as magnesium supplement started. We will monitor We will replete if needed  7.   Prolonged QT.  This was noted on admission Will avoid QT prolonged gating agent  8.  Nausea.  Patient was given a one-time dose of Zofran.  Due to her QT prolongation we will just give a one-time dose and switch to as needed hydroxyzine by mouth  9.  Severe arctic valve stenosis Follow-up with cardiology outpatient    Code Status: Full  Severity of Illness: The appropriate patient status for this patient is INPATIENT. Inpatient status is judged to be reasonable and necessary in order to provide the required intensity of service to ensure the patient's safety. The patient's presenting symptoms, physical exam findings, and initial radiographic and laboratory data in the context of their chronic comorbidities is felt to place them at high risk for further clinical deterioration. Furthermore, it is not anticipated that the patient will be medically stable for discharge from the hospital within 2 midnights of admission. The following factors support the patient status of inpatient.  Fever leukocytosis and encephalopathy.  Requiring IV antibiotics  * I certify that at the point of admission it is my clinical judgment that the patient will require inpatient hospital care spanning beyond 2 midnights from the point of admission due to high intensity of service, high risk for further deterioration and high frequency of surveillance required.*   Family Communication: [name], daughter at bedside  Disposition Plan: discharge we will   Consultants: Nephrology for hemodialysis  Procedures: Hemodialysis  Antimicrobials: Rocephin  DVT prophylaxis: Apixaban   Objective: Vitals:   01/01/21 1140 01/01/21 1604 01/01/21 2111 01/02/21 0740  BP: (!) 152/51 (!) 137/56 Marland Kitchen)  132/55 (!) 163/62  Pulse: 75 79 75 76  Resp: 16 17 16 17   Temp: 98 F (36.7 C) 97.9 F (36.6 C) (!) 97.3 F (36.3 C) 97.7 F (36.5 C)  TempSrc: Oral Oral Oral Oral  SpO2: 100% 95% 93% 93%  Height:       No intake or  output data in the 24 hours ending 01/02/21 0903 There were no vitals filed for this visit. Body mass index is 28.55 kg/m.  Exam:  General: 75 y.o. year-old female well developed well nourished in no acute distress.  Alert and oriented x3. Cardiovascular: Regular rate and rhythm with no rubs or gallops.  No thyromegaly or JVD noted.   Respiratory: Clear to auscultation with no wheezes or rales. Good inspiratory effort. Abdomen: Soft nontender nondistended with normal bowel sounds x4 quadrants. Musculoskeletal: No lower extremity edema. 2/4 pulses in all 4 extremities. Skin: No ulcerative lesions noted or rashes, Psychiatry: Mood is appropriate for condition and setting    Data Reviewed: CBC: Recent Labs  Lab 12/31/20 1714 01/01/21 0342 01/02/21 0224  WBC 20.3* 13.0* 17.3*  NEUTROABS 18.5*  --   --   HGB 9.2* 8.1* 9.7*  HCT 29.0* 25.4* 30.6*  MCV 98.0 96.9 97.1  PLT 267 240 952   Basic Metabolic Panel: Recent Labs  Lab 12/31/20 1714 01/01/21 0342 01/02/21 0224  NA 133* 134* 131*  K 3.1* 3.1* 4.2  CL 92* 95* 93*  CO2 31 32 26  GLUCOSE 115* 83 79  BUN 10 13 21   CREATININE 2.55* 2.93* 3.89*  CALCIUM 7.8* 7.9* 8.2*  MG  --   --  2.2  PHOS  --  2.9  --    GFR: CrCl cannot be calculated (Unknown ideal weight.). Liver Function Tests: Recent Labs  Lab 12/31/20 1714 01/01/21 0342  AST 13*  --   ALT 10  --   ALKPHOS 87  --   BILITOT 0.5  --   PROT 4.6*  --   ALBUMIN 1.8* 1.6*   No results for input(s): LIPASE, AMYLASE in the last 168 hours. Recent Labs  Lab 12/31/20 1715  AMMONIA 17   Coagulation Profile: Recent Labs  Lab 12/31/20 1714  INR 1.8*   Cardiac Enzymes: No results for input(s): CKTOTAL, CKMB, CKMBINDEX, TROPONINI in the last 168 hours. BNP (last 3 results) No results for input(s): PROBNP in the last 8760 hours. HbA1C: No results for input(s): HGBA1C in the last 72 hours. CBG: No results for input(s): GLUCAP in the last 168 hours. Lipid  Profile: No results for input(s): CHOL, HDL, LDLCALC, TRIG, CHOLHDL, LDLDIRECT in the last 72 hours. Thyroid Function Tests: No results for input(s): TSH, T4TOTAL, FREET4, T3FREE, THYROIDAB in the last 72 hours. Anemia Panel: No results for input(s): VITAMINB12, FOLATE, FERRITIN, TIBC, IRON, RETICCTPCT in the last 72 hours. Urine analysis:    Component Value Date/Time   COLORURINE YELLOW 12/31/2020 1840   APPEARANCEUR TURBID (A) 12/31/2020 1840   LABSPEC  12/31/2020 1840    TEST NOT REPORTED DUE TO COLOR INTERFERENCE OF URINE PIGMENT   PHURINE  12/31/2020 1840    TEST NOT REPORTED DUE TO COLOR INTERFERENCE OF URINE PIGMENT   GLUCOSEU (A) 12/31/2020 1840    TEST NOT REPORTED DUE TO COLOR INTERFERENCE OF URINE PIGMENT   HGBUR (A) 12/31/2020 1840    TEST NOT REPORTED DUE TO COLOR INTERFERENCE OF URINE PIGMENT   BILIRUBINUR (A) 12/31/2020 1840    TEST NOT REPORTED DUE TO COLOR INTERFERENCE OF  URINE PIGMENT   BILIRUBINUR neg 09/18/2014 0920   KETONESUR (A) 12/31/2020 1840    TEST NOT REPORTED DUE TO COLOR INTERFERENCE OF URINE PIGMENT   PROTEINUR (A) 12/31/2020 1840    TEST NOT REPORTED DUE TO COLOR INTERFERENCE OF URINE PIGMENT   UROBILINOGEN 0.2 05/02/2015 2020   NITRITE (A) 12/31/2020 1840    TEST NOT REPORTED DUE TO COLOR INTERFERENCE OF URINE PIGMENT   LEUKOCYTESUR (A) 12/31/2020 1840    TEST NOT REPORTED DUE TO COLOR INTERFERENCE OF URINE PIGMENT   Sepsis Labs: @LABRCNTIP (procalcitonin:4,lacticidven:4)  ) Recent Results (from the past 240 hour(s))  Urine Culture     Status: Abnormal (Preliminary result)   Collection Time: 12/31/20  5:14 PM   Specimen: Urine, Clean Catch  Result Value Ref Range Status   Specimen Description URINE, CLEAN CATCH  Final   Special Requests NONE  Final   Culture (A)  Final    >=100,000 COLONIES/mL GRAM NEGATIVE RODS CULTURE REINCUBATED FOR BETTER GROWTH Performed at [facility], [place] [address] [place], [place] [address]    Report  Status PENDING  Incomplete  Blood Culture (routine x 2)     Status: None (Preliminary result)   Collection Time: 12/31/20  6:09 PM   Specimen: Right Antecubital; Blood  Result Value Ref Range Status   Specimen Description RIGHT ANTECUBITAL  Final   Special Requests   Final    BOTTLES DRAWN AEROBIC AND ANAEROBIC Blood Culture adequate volume   Culture   Final    NO GROWTH < 12 HOURS Performed at [facility], [place] [address] [place], [place] [address]    Report Status PENDING  Incomplete  Resp Panel by RT-PCR (Flu A&B, Covid) Nasopharyngeal Swab     Status: None   Collection Time: 12/31/20  6:16 PM   Specimen: Nasopharyngeal Swab; Nasopharyngeal(NP) swabs in vial transport medium  Result Value Ref Range Status   SARS Coronavirus 2 by RT PCR NEGATIVE NEGATIVE Final    Comment: (NOTE) SARS-CoV-2 target nucleic acids are NOT DETECTED.  The SARS-CoV-2 RNA is generally detectable in upper respiratory specimens during the acute phase of infection. The lowest concentration of SARS-CoV-2 viral copies this assay can detect is 138 copies/mL. A negative result does not preclude SARS-Cov-2 infection and should not be used as the sole basis for treatment or other patient management decisions. A negative result may occur with  improper specimen collection/handling, submission of specimen other than nasopharyngeal swab, presence of viral mutation(s) within the areas targeted by this assay, and inadequate number of viral copies(<138 copies/mL). A negative result must be combined with clinical observations, patient history, and epidemiological information. The expected result is Negative.  Fact Sheet for Patients:  EntrepreneurPulse.com.au  Fact Sheet for Healthcare Providers:  IncredibleEmployment.be  This test is no t yet approved or cleared by the [place] FDA and  has been authorized for detection and/or diagnosis of SARS-CoV-2 by FDA under an  Emergency Use Authorization (EUA). This EUA will remain  in effect (meaning this test can be used) for the duration of the COVID-19 declaration under Section 564(b)(1) of the Act, 21 U.S.C.section 360bbb-3(b)(1), unless the authorization is terminated  or revoked sooner.       Influenza A by PCR NEGATIVE NEGATIVE Final   Influenza B by PCR NEGATIVE NEGATIVE Final    Comment: (NOTE) The Xpert Xpress SARS-CoV-2/FLU/RSV plus assay is intended as an aid in the diagnosis of influenza from Nasopharyngeal swab specimens and should not be  used as a sole basis for treatment. Nasal washings and aspirates are unacceptable for Xpert Xpress SARS-CoV-2/FLU/RSV testing.  Fact Sheet for Patients: EntrepreneurPulse.com.au  Fact Sheet for Healthcare Providers: IncredibleEmployment.be  This test is not yet approved or cleared by the [place] FDA and has been authorized for detection and/or diagnosis of SARS-CoV-2 by FDA under an Emergency Use Authorization (EUA). This EUA will remain in effect (meaning this test can be used) for the duration of the COVID-19 declaration under Section 564(b)(1) of the Act, 21 U.S.C. section 360bbb-3(b)(1), unless the authorization is terminated or revoked.  Performed at [facility], [place] [address] [place], [place] [address]   Blood Culture (routine x 2)     Status: None (Preliminary result)   Collection Time: 12/31/20  6:50 PM   Specimen: BLOOD LEFT FOREARM  Result Value Ref Range Status   Specimen Description BLOOD LEFT FOREARM  Final   Special Requests   Final    BOTTLES DRAWN AEROBIC AND ANAEROBIC Blood Culture results may not be optimal due to an inadequate volume of blood received in culture bottles   Culture   Final    NO GROWTH < 12 HOURS Performed at [facility], [place] [address], [place], [place] [address]    Report Status PENDING  Incomplete      Studies: No results found.  Scheduled  Meds:  amiodarone  200 mg Oral Daily   apixaban  2.5 mg Oral BID   multivitamin  1 tablet Oral QHS   polycarbophil  625 mg Oral BID AC    Continuous Infusions:  cefTRIAXone (ROCEPHIN)  IV 2 g (01/02/21 0857)     LOS: 1 day     Cristal Deer, MD [facility]  To reach me or the doctor on call, go to: [email or web address] Password [facility]  01/02/2021, 9:03 AM

## 2021-01-02 NOTE — Procedures (Addendum)
   I was present at this dialysis session, have reviewed the session itself and made  appropriate changes Kelly Splinter MD [place] pager [phone]   01/02/2021, 9:05 PM

## 2021-01-02 NOTE — TOC Progression Note (Signed)
Transition of Care [facility]) - Progression Note    Patient Details  Name: BERENIS CORTER MRN: [id number] Date of Birth: Oct 31, 1945  [facility]) CM/SW Contact  Bartholomew Crews, RN Phone Number: [phone] 01/02/2021, 3:26 PM  Clinical Narrative:     Confirmed with [facility] that patient is active for [facility] PT. Patient will need HH PT order with [facility] at discharge for resumption of services.        Expected Discharge Plan and Services                                     HH Arranged: PT [place] Agency: [place] Date [place]: 01/02/21 Time HH Agency Contacted: 1000 Representative spoke with at [place]: [place] ([place]) Interventions    Readmission Risk Interventions No flowsheet data found.

## 2021-01-02 NOTE — TOC Initial Note (Addendum)
Transition of Care [facility]) - Initial/Assessment Note    Patient Details  Name: [name] MRN: [id number] Date of Birth: 11-15-1945  Transition [facility]) CM/SW Contact:    Ina Homes, [place] Phone Number: 01/02/2021, 3:13 PM  Clinical Narrative:                  SW met with pt at bedside. Pt confirmed demographics. Pt lives with daughter [name] [phone]). Pt does not drive and uses [facility] for transport to HD. Pt reports having [facility], 3 different walkers, and a w/c. Pt reports she currently has [facility] services but unsure of agency. Pt has hx at SNF but had a bad experience. Pt refused SNF and wants to return home with [facility] services and support from daughter.    Patient Goals and CMS Choice Patient states their goals for this hospitalization and ongoing recovery are:: return home      Expected Discharge Plan and Services      Prior Living Arrangements/Services   Lives with:: Adult Children Patient language and need for interpreter reviewed:: Yes Do you feel safe going back to the place where you live?: Yes        Care giver support system in place?: Yes (comment) Current home services: DME Criminal Activity/Legal Involvement Pertinent to Current Situation/Hospitalization: No - Comment as needed  Activities of Daily Living Home Assistive Devices/Equipment: Wheelchair ADL Screening (condition at time of admission) Patient's cognitive ability adequate to safely complete daily activities?: Yes Is the patient deaf or have difficulty hearing?: No Does the patient have difficulty seeing, even when wearing glasses/contacts?: No Does the patient have difficulty concentrating, remembering, or making decisions?: No Patient able to express need for assistance with ADLs?: Yes Does the patient have difficulty dressing or bathing?: Yes Independently performs ADLs?: No Does the patient have difficulty walking or climbing stairs?: Yes Weakness of Legs: Both Weakness of Arms/Hands:  Left  Permission Sought/Granted Permission sought to share information with : Family Supports, Case Manager Permission granted to share information with : Yes, Verbal Permission Granted  Share Information with NAME: [name]  Permission granted to share info w AGENCY: [facility]  Permission granted to share info w Relationship: Daughter     Emotional Assessment Appearance:: Appears stated age Attitude/Demeanor/Rapport: Engaged Affect (typically observed): Accepting, Adaptable, Pleasant Orientation: : Oriented to Self, Oriented to Place, Oriented to  Time, Oriented to Situation      Admission diagnosis:  Weakness [R53.1] Acute UTI [N39.0] UTI (urinary tract infection) [N39.0] Patient Active Problem List   Diagnosis Date Noted   UTI (urinary tract infection) 01/01/2021   Weakness 12/31/2020   Chronic diastolic heart failure ([place]) 12/11/2020   Anxiety 12/11/2020   Malignant hypertensive chronic kidney disease 12/11/2020   Nonrheumatic aortic (valve) stenosis 12/11/2020   Obesity 12/11/2020   Pulmonary hypertension ([place]) 12/11/2020   Current mild episode of major depressive disorder (HCC)    Labile blood pressure    Pain in left arm 11/11/2020   Chronic UTI    Loose stools    ESRD on dialysis ([place])    Acute on chronic anemia    Post-operative pain    Debility 11/03/2020   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   Compartment syndrome ([place]) 10/27/2020   Stress fracture, hip, unspecified, initial encounter for fracture 06/25/2020   Hip fracture ([place]) 06/07/2020   Leukocytosis 06/07/2020   Acute hypoxemic respiratory failure ([place]) 06/07/2020   Atrial fibrillation ([place]) 06/07/2020   ESRD (end stage renal disease) ([place])  06/07/2020   Gout, unspecified 11/14/2019   Primary localized osteoarthritis of left knee 10/22/2019   S/P TKR (total knee replacement), left 10/22/2019   Other long term (current) drug therapy 05/28/2019   Allergy, unspecified, initial encounter 04/08/2019    Gastroenteritis due to norovirus    Anticoagulated on Coumadin    Hiatal hernia    Melena    UGI bleed 08/23/2018   Hypokalemia 08/23/2018   Hyperlipidemia 08/23/2018   GERD (gastroesophageal reflux disease) 08/23/2018   Hypertension 08/23/2018   Personal history of Methicillin resistant Staphylococcus aureus infection 05/02/2018   Methicillin resistant Staphylococcus aureus infection, unspecified site 03/22/2018   Hypothyroidism, unspecified 03/09/2017   CAP (community acquired pneumonia) 07/02/2016   Dehydration    PNA (pneumonia) 05/10/2016   Pain, unspecified 03/10/2016   Chills (without fever) 01/30/2016   On amiodarone therapy 11/30/2015   Pleural effusion on left    Pleurisy    Hypoxemia    HCAP (healthcare-associated pneumonia) 11/21/2015   S/P thoracentesis    Chest pain 11/20/2015   Pleural effusion, left 11/20/2015   Pain in the chest    Pericardial effusion    Pleural effusion    Encounter for therapeutic drug monitoring 11/13/2015   Anemia in chronic kidney disease 11/06/2015   Iron deficiency anemia, unspecified 11/06/2015   Other specified coagulation defects ([place]) 11/06/2015   Pruritus, unspecified 11/06/2015   Secondary hyperparathyroidism of renal origin ([place]) 11/06/2015   Shortness of breath 11/06/2015   Atrial fibrillation with RVR (HCC)    PAF (paroxysmal atrial fibrillation) ([place]) 11/05/2015   Urinary tract infectious disease 10/31/2015   Pyelonephritis 09/25/2015   Sepsis ([place]) 09/25/2015   Anemia of renal disease 08/14/2015   Severe protein-calorie malnutrition ([place]) 05/27/2015   ESRD (end stage renal disease) ([place])    Normocytic anemia 11/23/2014   Essential hypertension, benign 08/14/2013   Glaucoma    PCP:  Lajean Manes, MD Pharmacy:   [facility] [id number] - [place], [place] - [address] [place] [address] Phone: [phone] Fax: [phone]  [facility] [id number] [place] [place] [address] Phone: [phone] Fax: [phone]     Social Determinants of Health (SDOH) Interventions    Readmission Risk Interventions No flowsheet data found.

## 2021-01-02 NOTE — Consult Note (Signed)
Renal Service Consult Note [facility]  [name] 01/02/2021 [name], MD Requesting Physician: Dr. Kyung Bacca  Reason for Consult: ESRD pt w/ gen'd weakness, UTI HPI: The patient is a 75 y.o. year-old presented to ED w/ feeling weak and lethargic, ill. Some delirium per family recently. EMS temp was 101.5 F.  In ED UA showed wbc's, pt admitted for suspected infection/ UTI/ sepsis. Asked to see for dialysis.   Pt seen in her room.  She c/o nausea , gen weakness and back pain. No SOB or cough.  Chronic swelling of hips > lower legs.  Hasn't missed any HD.  They are not using the L AVF yet because she "wants to wait until the swelling goes done".     ROS - denies CP, no joint pain, no HA, no blurry vision, no rash, no diarrhea, no nausea/ vomiting, no dysuria, no difficulty voiding   Past Medical History  Past Medical History:  Diagnosis Date   Arthritis of left knee    Cardiomyopathy ([place])    a. h/o LV dysfunction EF 20-25% in 2013 due to sepsis.>> improved to normal    Chronic diastolic CHF (congestive heart failure) ([place])    10/ 2013 in setting of septic shock   Complication of anesthesia    use a little anesthesia , per patient MD states she quit breathing (2016); hard to wake up   ESRD (end stage renal disease) ([place])    dialysis Tues, Thurs, Sat [address], sees dr deterding    GERD (gastroesophageal reflux disease)    Glaucoma    both eyes   H/O hiatal hernia    a. CT 2017: large gastric hiatal hernia.   Headache(784.0)    migraine hx of   History of blood transfusion 04/13/2015    History of echocardiogram    a. Echo 6/17: EF 60-65%, normal wall motion, mild MR, atrial septal lipomatous hypertrophy, PASP 34 mmHg, possible trivial free-flowing pericardial effusion along RV free wall // b. Echo 5/17: Mild LVH, EF 55-60%, normal wall motion, grade 1 diastolic dysfunction, trivial MR, severe LAE, mild RAE, PASP 42 mmHg   History of kidney stones     10/18/2019: per patient "has a couple currently one in each kidney"   History of nephrostomy 04/11/2015   currently inplace 04/28/2015  removed now   History of nuclear stress test    a. Myoview 1/14 - Marked ischemia in the basal anterior, mid anterior, apical septal and apical inferior regions, EF 63% >> LHC normal    Hyperlipidemia    Hypertension    medication removed from regimen due to low blood pressure    Iron deficiency    hx   Myocardial infarction [facility]) 2013   10/18/2019: per patient "in 2013)   Nephrolithiasis 2002, 2006   bilateral   Normal coronary arteries 2014   a. LHC in 1/14: normal coronary arteries   PAF (paroxysmal atrial fibrillation) ([place])    a. 10/ 2013  in setting of Septic Shock //  b. recurrent during admit for pneumonia, L effusion >> placed on Amiodarone // Coumadin for anticoagulation   Pneumonia jan 2018, last tme lungs clear now   dx 10-06-2014 per CXR--  on 10-27-2014 pt states finished antibiotic and denies cough or fever   Primary localized osteoarthritis of right hip 10/22/2019   S/P hemodialysis catheter insertion ([place]) 04/11/2015    right anterior chest , only used once    Sigmoid diverticulosis  SOB (shortness of breath) on exertion    10/18/2019: per patient "get short of breath with activity sometimes due to heart valve"   UTI (urinary tract infection) 05/10/2016   Past Surgical History  Past Surgical History:  Procedure Laterality Date   AV FISTULA PLACEMENT Left 06/02/2015   Procedure: BRACHIOCEPHALIC ARTERIOVENOUS (AV) FISTULA CREATION ;  Surgeon: Conrad Unity, MD;  Location: [place];  Service: Vascular;  Laterality: Left;   [place] Left 07/27/2015   Procedure: FIRST STAGE BASILIC VEIN TRANSPOSITION LEFT UPPER ARM;  Surgeon: Conrad [place] Hope, MD;  Location: [place];  Service: Vascular;  Laterality: Left;   [place] Left 09/2015   second phase   [place] Left 10/12/2015   Procedure: SECOND STAGE  BASILIC VEIN TRANSPOSITION LEFT ARM;  Surgeon: Conrad Corinth, MD;  Location: [place];  Service: Vascular;  Laterality: Left;   BREAST BIOPSY Left 08/23/07   benign fibrocystic with duct ectasia   CARDIAC CATHETERIZATION  07-11-2012  dr Irish Lack   Abnormal stress test/   normal coronary arteries/  LVEDP  51mmHg   CARDIOVASCULAR STRESS TEST  06-26-2012  dr Irish Lack   marked ischemia in the basal anterior, mid anterior, apical inferior regions/  normal LVF, ef 63%   CATARACT EXTRACTION W/ INTRAOCULAR LENS  IMPLANT, BILATERAL     COLONOSCOPY WITH PROPOFOL N/A 10/17/2016   Procedure: COLONOSCOPY WITH PROPOFOL;  Surgeon: Garlan Fair, MD;  Location: [facility];  Service: Endoscopy;  Laterality: N/A;   CYSTOSCOPY W/ URETERAL STENT PLACEMENT  04/04/2012   Procedure: CYSTOSCOPY WITH RETROGRADE PYELOGRAM/URETERAL STENT PLACEMENT;  Surgeon: Ailene Rud, MD;  Location: [place];  Service: Urology;  Laterality: Left;   CYSTOSCOPY W/ URETERAL STENT PLACEMENT Bilateral 05/04/2015   Procedure: CYSTOSCOPY WITH BILATERAL RETROGRADE PYELOGRAM/ WITH INTERPRETATION, EXCHANGE OF RIGHT URETERAL STENT REPLACEMENT AND PLACEMENT LEFT URETERAL STENT PLACEMENT EXAMINATION OF VAGINA;  Surgeon: Carolan Clines, MD;  Location: [facility];  Service: Urology;  Laterality: Bilateral;   CYSTOSCOPY WITH STENT PLACEMENT Right 10/28/2014   Procedure: RIGHT URETERAL STENT PLACEMENT;  Surgeon: Irine Seal, MD;  Location: [facility];  Service: Urology;  Laterality: Right;   CYSTOSCOPY WITH STENT PLACEMENT Right 02/26/2015   Procedure: CYSTOSCOPY RETROGRADE PYELOGRAM WITH STENT PLACEMENT;  Surgeon: Cleon Gustin, MD;  Location: [facility];  Service: Urology;  Laterality: Right;   CYSTOSCOPY/RETROGRADE/URETEROSCOPY/STONE EXTRACTION WITH BASKET Right 11/21/2014   Procedure: CYSTOSCOPY/RIGHT RETROGRADE PYELOGRAM/RIGHT URETEROSCOPY/BASKET EXTRACTION/RIGHT PYELOSCOPY/LASER OF STONE/RIGHT DOUBLE J STENT;  Surgeon: Carolan Clines, MD;  Location: [facility];  Service: Urology;  Laterality: Right;   ESOPHAGOGASTRODUODENOSCOPY (EGD) WITH PROPOFOL Left 08/25/2018   Procedure: ESOPHAGOGASTRODUODENOSCOPY (EGD) WITH PROPOFOL;  Surgeon: Ronald Lobo, MD;  Location: [place];  Service: Endoscopy;  Laterality: Left;   EXTRACORPOREAL SHOCK WAVE LITHOTRIPSY  05-28-2012  &  10-08-2012   HEMATOMA EVACUATION Left 10/27/2020   Procedure: EVACUATION HEMATOMA, repair of Pseudoaneurysm;  Surgeon: Waynetta Sandy, MD;  Location: [place];  Service: Vascular;  Laterality: Left;   HIP ARTHROPLASTY Left 06/09/2020   Procedure: ARTHROPLASTY BIPOLAR HIP (HEMIARTHROPLASTY);  Surgeon: Marchia Bond, MD;  Location: [place];  Service: Orthopedics;  Laterality: Left;   HOLMIUM LASER APPLICATION Right 08/19/5174   Procedure: HOLMIUM LASER APPLICATION;  Surgeon: Carolan Clines, MD;  Location: [facility];  Service: Urology;  Laterality: Right;   IR GENERIC HISTORICAL  02/01/2016   IR NEPHROSTOMY EXCHANGE RIGHT 02/01/2016 Greggory Keen, MD [facility]   IR [facility]  HISTORICAL  02/24/2016   IR PATIENT EVAL TECH 0-60 MINS 02/24/2016 Aletta Edouard, MD [facility]   KNEE ARTHROSCOPY Left 02-14-2003   LAPAROSCOPIC CHOLECYSTECTOMY  03-23-2005   TOTAL ABDOMINAL HYSTERECTOMY W/ BILATERAL SALPINGOOPHORECTOMY  1993   secondary to fibroids   TOTAL KNEE ARTHROPLASTY Left 10/22/2019   TOTAL KNEE ARTHROPLASTY Left 10/22/2019   Procedure: TOTAL KNEE ARTHROPLASTY;  Surgeon: Marchia Bond, MD;  Location: [place];  Service: Orthopedics;  Laterality: Left;   TRANSTHORACIC ECHOCARDIOGRAM  04-09-2012   normal LVF,  ef 60-65%,  mild LAE,  mild TR, trivial MR and PR   Family History  Family History  Problem Relation Age of Onset   Hypertension Mother    Cancer Mother 60       breast   Dementia Mother    Hypertension Brother    Diabetes Brother    Heart disease Brother        before age 30   Cancer Father 86        pancreatic   Heart failure Paternal Grandmother    Bladder Cancer Maternal Grandfather    Social History  reports that she has never smoked. She has never used smokeless tobacco. She reports that she does not drink alcohol and does not use drugs. Allergies  Allergies  Allergen Reactions   Astemizole Nausea And Vomiting   Fluorouracil Rash   Vicodin [Hydrocodone-Acetaminophen] Nausea And Vomiting   Chlorhexidine Rash    Sunburn    rash   Percocet [Oxycodone-Acetaminophen] Nausea And Vomiting   Home medications Prior to Admission medications   Medication Sig Start Date End Date Taking? Authorizing Provider  amiodarone (PACERONE) 200 MG tablet Take 1 tablet (200 mg total) by mouth daily. 08/24/20  Yes Jettie Booze, MD  apixaban (ELIQUIS) 2.5 MG TABS tablet Take 1 tablet (2.5 mg total) by mouth 2 (two) times daily. 09/14/20  Yes Jettie Booze, MD  cephALEXin (KEFLEX) 250 MG capsule Take 250 mg by mouth daily. 05/24/20  Yes [provider]  colestipol (COLESTID) 5 g packet Take 5 g by mouth 2 (two) times daily.   Yes [provider]  gabapentin (NEURONTIN) 600 MG tablet Take 0.5 tablets (300 mg total) by mouth at bedtime. 12/02/20  Yes Love, Ivan Anchors, PA-C  multivitamin (RENA-VIT) TABS tablet Take 1 tablet by mouth at bedtime. 07/27/19  Yes [provider]  polycarbophil (FIBERCON) 625 MG tablet Take 1 tablet (625 mg total) by mouth 2 (two) times daily before lunch and supper. 12/02/20  Yes Love, Ivan Anchors, PA-C  saccharomyces boulardii (FLORASTOR) 250 MG capsule Take 1 capsule (250 mg total) by mouth 2 (two) times daily. 07/08/15  Yes Donne Hazel, MD  heparin 1000 unit/mL SOLN injection Heparin Sodium (Porcine) 1,000 Units/mL Catheter Lock Arterial 10/24/20 10/23/21  [provider]     Vitals:   01/01/21 1140 01/01/21 1604 01/01/21 2111 01/02/21 0740  BP: (!) 152/51 (!) 137/56 (!) 132/55 (!) 163/62  Pulse: 75 79 75 76  Resp: 16 17 16 17   Temp:  98 F (36.7 C) 97.9 F (36.6 C) (!) 97.3 F (36.3 C) 97.7 F (36.5 C)  TempSrc: Oral Oral Oral Oral  SpO2: 100% 95% 93% 93%  Height:       Exam Gen alert, no distress No rash, cyanosis or gangrene Sclera anicteric, throat clear  No jvd or bruits Chest clear bilat to bases, no rales/ wheezing RRR no MRG Abd soft ntnd no mass or ascites +bs GU defer  MS no joint effusions or deformity Ext 1-2+ hip edema, trace ankle edema bilat Neuro is alert, Ox 3 , nf, gen weakness  R IJ TDC, +LUA AVF + bruit, much less bruising and edema than last admit        OP HD: [facility] TTS 4h  400/500  72kg   2/2 bath P2  Hep none  AVF and TDC, either may be used - mircera 60ug q2 wks, last 7/14    Assessment/Plan: Gen weakness - +fever and ^wbc , possibly due to UTI, on empiric IV abx ESRD -  HD TTS. HD today.  Hypertension/volume  - CXR at baseline. +chronic dependent hip edema. Wt's may be off or she is well under her dry wt. BP only tolerated 1.5 L off today.  Anemia of CKD/ABLA   - Hgb 9's, last esa was on 7/14, not due till around 7/28.  Metabolic bone disease -  no vdra, cont binders Recent admit for LUE compartment syndrome: underwent evacuation of hematoma/ repair of bleeding brachial artery pseudoaneurysm on 5/10 per [facility]. Appears to be healing over well.  Chronic combined CHF -- volume removal with UF on HD PAFib- On Eliquis/amiodarone  Severe aortic stenosis - f/b cardiology Nutrition - Renal diet/fluid restriction.  Encourage nutrition.     Kelly Splinter  MD 01/02/2021, 1:04 PM  Recent Labs  Lab 01/01/21 0342 01/02/21 0224  WBC 13.0* 17.3*  HGB 8.1* 9.7*   Recent Labs  Lab 01/01/21 0342 01/02/21 0224  K 3.1* 4.2  BUN 13 21  CREATININE 2.93* 3.89*  CALCIUM 7.9* 8.2*  PHOS 2.9  --

## 2021-01-02 NOTE — Plan of Care (Signed)
  Problem: Education: Goal: Knowledge of General Education information will improve Description: Including pain rating scale, medication(s)/side effects and non-pharmacologic comfort measures Outcome: Progressing   Problem: Pain Managment: Goal: General experience of comfort will improve Outcome: Progressing   Problem: Health Behavior/Discharge Planning: Goal: Ability to manage health-related needs will improve Outcome: Not Progressing   Problem: Clinical Measurements: Goal: Ability to maintain clinical measurements within normal limits will improve Outcome: Not Progressing   Problem: Activity: Goal: Risk for activity intolerance will decrease Outcome: Not Progressing   Problem: Nutrition: Goal: Adequate nutrition will be maintained Outcome: Not Progressing

## 2021-01-03 DIAGNOSIS — N390 Urinary tract infection, site not specified: Secondary | ICD-10-CM | POA: Diagnosis not present

## 2021-01-03 DIAGNOSIS — R531 Weakness: Secondary | ICD-10-CM | POA: Diagnosis not present

## 2021-01-03 LAB — URINE CULTURE: Culture: >=100000 — AB

## 2021-01-03 LAB — IRON AND TIBC: Iron: 26 ug/dL — ABNORMAL LOW (ref 28–170)

## 2021-01-03 MED ORDER — WITCH HAZEL-GLYCERIN EX PADS
1.0000 "application " | MEDICATED_PAD | CUTANEOUS | Status: DC | PRN
  Filled 2021-01-03: qty 100

## 2021-01-03 MED ORDER — HYDROCORTISONE ACETATE 25 MG RE SUPP
25.0000 mg | Freq: Two times a day (BID) | RECTAL | Status: DC
  Administered 2021-01-04: 25 mg via RECTAL
  Filled 2021-01-03 (×13): qty 1

## 2021-01-03 MED ORDER — SODIUM CHLORIDE 0.9 % IV SOLN
500.0000 mg | INTRAVENOUS | Status: AC
  Administered 2021-01-03 – 2021-01-07 (×5): 500 mg via INTRAVENOUS
  Filled 2021-01-03 (×5): qty 0.5

## 2021-01-03 MED ORDER — NEPRO/CARBSTEADY PO LIQD: 237.0000 mL | Freq: Two times a day (BID) | ORAL | Status: DC

## 2021-01-03 NOTE — Plan of Care (Signed)
  Problem: Education: Goal: Knowledge of General Education information will improve Description: Including pain rating scale, medication(s)/side effects and non-pharmacologic comfort measures Outcome: Progressing   Problem: Health Behavior/Discharge Planning: Goal: Ability to manage health-related needs will improve Outcome: Progressing   Problem: Clinical Measurements: Goal: Ability to maintain clinical measurements within normal limits will improve Outcome: Progressing   Problem: Nutrition: Goal: Adequate nutrition will be maintained Outcome: Progressing   Problem: Pain Managment: Goal: General experience of comfort will improve Outcome: Progressing   Problem: Activity: Goal: Risk for activity intolerance will decrease Outcome: Not Progressing   

## 2021-01-03 NOTE — Progress Notes (Signed)
Subjective: This a.m. alert, coherent oriented x3, said tolerated dialysis yesterday, feels better.  Objective Vital signs in last 24 hours: Vitals:   01/02/21 1845 01/02/21 1855 01/02/21 2029 01/03/21 0732  BP: (!) 104/54 (!) 125/53 (!) 114/44 (!) 138/56  Pulse: 78 77 74 70  Resp:  16 17 16   Temp:  98.2 F (36.8 C) 97.8 F (36.6 C) 97.7 F (36.5 C)  TempSrc:  Oral Oral Oral  SpO2:   93% 95%  Weight:  65.2 kg    Height:       Weight change:   Physical Exam: General: Alert elderly female NAD Heart: RRR, 2/6 SEM, no rub or gallop Lungs: CTA nonlabored breathing Abdomen: Bowel sounds positive normoactive soft nontender no ascites no distention Extremities: 1+ bipedal edema to thighs Dialysis Access: Positive bruit LUA AV fistula, mild bruising edema, R IJ TDC with dressing dry and clean   OP HD: [facility] TTS 4h  400/500  72kg   2/2 bath P2  Hep none  AVF and TDC, either may be used - mircera 60ug q2 wks, last 7/14     Problem/Plan: Gen weakness - +fever and ^wbc , possibly due to UTI, on empiric IV abx, symptomatically confusion weakness resolved, afebrile, urine culture more than 100,000 colonies E. coli and 80,000 colonies Proteus mirabilis, blood cultures no growth so far ESRD -  HD TTS. HD yesterday 7/16 on schedule Hypertension/volume  - CXR at baseline. +chronic dependent hip edema. Wt's may be off or she is well under her dry wt. BP only tolerated 1.5 L off yesterday 7/16, a.m. blood pressure stable 138/56 Anemia of CKD/ABLA   - Hgb 9.7(7/16) last esa was on 7/14, not due till around 7/28.  Metabolic bone disease -  no vdra, cont binders Phos 2.9(7/15 )corrected calcium 10.1 = 7/16 Recent admit for LUE compartment syndrome: underwent evacuation of hematoma/ repair of bleeding brachial artery pseudoaneurysm on 5/10 per [facility]. Appears to be healing over well.  Chronic combined CHF -- volume removal with UF on HD PAFib- On Eliquis/amiodarone, NS on exam today Severe aortic stenosis  - f/b cardiology Nutrition - Renal diet/fluid restriction.  Encourage nutrition.  ALB 1.6 start Nepro supplement   Ernest Haber, PA-C [place] [phone] 01/03/2021,9:22 AM  LOS: 2 days   Labs: Basic Metabolic Panel: Recent Labs  Lab 12/31/20 1714 01/01/21 0342 01/02/21 0224  NA 133* 134* 131*  K 3.1* 3.1* 4.2  CL 92* 95* 93*  CO2 31 32 26  GLUCOSE 115* 83 79  BUN 10 13 21   CREATININE 2.55* 2.93* 3.89*  CALCIUM 7.8* 7.9* 8.2*  PHOS  --  2.9  --    Liver Function Tests: Recent Labs  Lab 12/31/20 1714 01/01/21 0342  AST 13*  --   ALT 10  --   ALKPHOS 87  --   BILITOT 0.5  --   PROT 4.6*  --   ALBUMIN 1.8* 1.6*   No results for input(s): LIPASE, AMYLASE in the last 168 hours. Recent Labs  Lab 12/31/20 1715  AMMONIA 17   CBC: Recent Labs  Lab 12/31/20 1714 01/01/21 0342 01/02/21 0224  WBC 20.3* 13.0* 17.3*  NEUTROABS 18.5*  --   --   HGB 9.2* 8.1* 9.7*  HCT 29.0* 25.4* 30.6*  MCV 98.0 96.9 97.1  PLT 267 240 279   Cardiac Enzymes: No results for input(s): CKTOTAL, CKMB, CKMBINDEX, TROPONINI in the last 168 hours. CBG: No results for input(s): GLUCAP in the last  168 hours.  Studies/Results: No results found. Medications:  cefTRIAXone (ROCEPHIN)  IV 2 g (01/03/21 2217)    amiodarone  200 mg Oral Daily   apixaban  2.5 mg Oral BID   Chlorhexidine Gluconate Cloth  6 each Topical Q0600   multivitamin  1 tablet Oral QHS   polycarbophil  625 mg Oral BID AC

## 2021-01-03 NOTE — Plan of Care (Signed)

## 2021-01-03 NOTE — Progress Notes (Addendum)
PROGRESS NOTE  [name]:[id number] DOB: 01/23/1946 DOA: 12/31/2020 PCP: Lajean Manes, MD  HPI/Recap of past 24 hours: [name] is a 75 y.o. female with medical history significant for ESRD on HD TTS, severe aortic stenosis, HFpEF 55 to 60%, paroxysmal A. fib on Eliquis, left hip fracture s/p repair in December 2021, who presented to [facility] ED from home on December 31, 2020 due to generalized weakness, associated with 1 episode of nausea and vomiting and chills after hemodialysis the day of presentation.  In the ER patient was said to have had urinary pressure and she was febrile with temperature of 103 and leukocytosis of 20,000.  And she was started on IV Rocephin antibiotics and admitted to inpatient with telemetry    January 02, 2021: Patient seen and examined: Yesterday patient had a run of symptomatic V. tach. Patient is complaining of nausea  January 03, 2021 Patient seen and examined at bedside.  She is looking much better than yesterday denies any nausea or vomiting daughter informed me that she had a bowel movement with some blood in it.  Patient had hemodialysis yesterday   Assessment/Plan: Active Problems:   Weakness   UTI (urinary tract infection)  #1 urinary tract infection. UA was positive for pyuria.  Patient had fever but that has resolved Urine culture is showing E. coli and Proteus Mirabella's. Patient was on ceftriaxone but her urinary culture is positive for Proteus mirabilis and E. coli and her antibiotics has been just changed to meropenem  2.  Acute metabolic encephalopathy She was confused on admission but she is not oriented but she still feeling generally weak PT OT to assess  3. anemia of chronic disease due to chronic kidney disease No acute bleeding ,FOBT was positive. She will follow-up with GI as outpatient  3.  End-stage renal disease.  She gets hemodialysis on Tuesday Thursday and Saturday I have consulted with nephrology for hemodialysis today if  possible Patient had hemodialysis yesterday  4.  Mild hyponatremia continue IV hydration  5.  Paroxysmal atrial fibrillation. Continue amiodarone and Eliquis Continue telemetry monitoring  6.  Nonsustained ventricular tachycardia.  Last night.  There is been no recurrence. Potassium was repleted as well as magnesium supplement started. We will monitor We will replete if needed  7.  Prolonged QT.  This was noted on admission Will avoid QT prolonged gating agent  8.  Nausea.  Patient was given a one-time dose of Zofran.  Due to her QT prolongation we will just give a one-time dose and switch to as needed hydroxyzine by mouth  9.  Severe arctic valve stenosis Follow-up with cardiology outpatient  10.  GI bleed.  Patient had a positive fecal occult blood test with anemia.  She had  not had any acute bleed.  She has a history of hemorrhoid.  Daughter informed me that patient had blood in her stool this afternoon.  This might be from hemorrhoid I will start her Tucks pad or hemorrhoidal cream I will order CBC and iron. If CBC is low can consult GI if normal she can follow-up as outpatient with GI as outpatient if appropriate    Code Status: Full  Severity of Illness: The appropriate patient status for this patient is INPATIENT. Inpatient status is judged to be reasonable and necessary in order to provide the required intensity of service to ensure the patient's safety. The patient's presenting symptoms, physical exam findings, and initial radiographic and laboratory data in the context of their  chronic comorbidities is felt to place them at high risk for further clinical deterioration. Furthermore, it is not anticipated that the patient will be medically stable for discharge from the hospital within 2 midnights of admission. The following factors support the patient status of inpatient.  Fever leukocytosis and encephalopathy.  Requiring IV antibiotics  * I certify that at the point of  admission it is my clinical judgment that the patient will require inpatient hospital care spanning beyond 2 midnights from the point of admission due to high intensity of service, high risk for further deterioration and high frequency of surveillance required.*   Family Communication: [name], daughter at bedside  Disposition Plan: discharge we will   Consultants: Nephrology for hemodialysis  Procedures: Hemodialysis  Antimicrobials: Rocephin stopped January 03, 2021 Meropenem started January 03, 2021   DVT prophylaxis: Apixaban   Objective: Vitals:   01/02/21 1845 01/02/21 1855 01/02/21 2029 01/03/21 0732  BP: (!) 104/54 (!) 125/53 (!) 114/44 (!) 138/56  Pulse: 78 77 74 70  Resp:  16 17 16   Temp:  98.2 F (36.8 C) 97.8 F (36.6 C) 97.7 F (36.5 C)  TempSrc:  Oral Oral Oral  SpO2:   93% 95%  Weight:  65.2 kg    Height:        Intake/Output Summary (Last 24 hours) at 01/03/2021 1325 Last data filed at 01/02/2021 1855 Gross per 24 hour  Intake 200 ml  Output 1648 ml  Net -1448 ml   Filed Weights   01/02/21 1514 01/02/21 1855  Weight: 66.8 kg 65.2 kg   Body mass index is 25.47 kg/m.  Exam:  General: 75 y.o. year-old female well developed well nourished in no acute distress.  Alert and oriented x3. Cardiovascular: Regular rate and rhythm with no rubs or gallops.  No thyromegaly or JVD noted.   Respiratory: Clear to auscultation with no wheezes or rales. Good inspiratory effort. Abdomen: Soft nontender nondistended with normal bowel sounds x4 quadrants. Musculoskeletal: No lower extremity edema. 2/4 pulses in all 4 extremities. Skin: No ulcerative lesions noted or rashes, Psychiatry: Mood is appropriate for condition and setting    Data Reviewed: CBC: Recent Labs  Lab 12/31/20 1714 01/01/21 0342 01/02/21 0224  WBC 20.3* 13.0* 17.3*  NEUTROABS 18.5*  --   --   HGB 9.2* 8.1* 9.7*  HCT 29.0* 25.4* 30.6*  MCV 98.0 96.9 97.1  PLT 267 240 279    Basic  Metabolic Panel: Recent Labs  Lab 12/31/20 1714 01/01/21 0342 01/02/21 0224  NA 133* 134* 131*  K 3.1* 3.1* 4.2  CL 92* 95* 93*  CO2 31 32 26  GLUCOSE 115* 83 79  BUN 10 13 21   CREATININE 2.55* 2.93* 3.89*  CALCIUM 7.8* 7.9* 8.2*  MG  --   --  2.2  PHOS  --  2.9  --     GFR: Estimated Creatinine Clearance: 11.2 mL/min (A) (by C-G formula based on SCr of 3.89 mg/dL (H)). Liver Function Tests: Recent Labs  Lab 12/31/20 1714 01/01/21 0342  AST 13*  --   ALT 10  --   ALKPHOS 87  --   BILITOT 0.5  --   PROT 4.6*  --   ALBUMIN 1.8* 1.6*    No results for input(s): LIPASE, AMYLASE in the last 168 hours. Recent Labs  Lab 12/31/20 1715  AMMONIA 17    Coagulation Profile: Recent Labs  Lab 12/31/20 1714  INR 1.8*    Cardiac Enzymes: No results for input(s):  CKTOTAL, CKMB, CKMBINDEX, TROPONINI in the last 168 hours. BNP (last 3 results) No results for input(s): PROBNP in the last 8760 hours. HbA1C: No results for input(s): HGBA1C in the last 72 hours. CBG: No results for input(s): GLUCAP in the last 168 hours. Lipid Profile: No results for input(s): CHOL, HDL, LDLCALC, TRIG, CHOLHDL, LDLDIRECT in the last 72 hours. Thyroid Function Tests: No results for input(s): TSH, T4TOTAL, FREET4, T3FREE, THYROIDAB in the last 72 hours. Anemia Panel: No results for input(s): VITAMINB12, FOLATE, FERRITIN, TIBC, IRON, RETICCTPCT in the last 72 hours. Urine analysis:    Component Value Date/Time   COLORURINE YELLOW 12/31/2020 1840   APPEARANCEUR TURBID (A) 12/31/2020 1840   LABSPEC  12/31/2020 1840    TEST NOT REPORTED DUE TO COLOR INTERFERENCE OF URINE PIGMENT   PHURINE  12/31/2020 1840    TEST NOT REPORTED DUE TO COLOR INTERFERENCE OF URINE PIGMENT   GLUCOSEU (A) 12/31/2020 1840    TEST NOT REPORTED DUE TO COLOR INTERFERENCE OF URINE PIGMENT   HGBUR (A) 12/31/2020 1840    TEST NOT REPORTED DUE TO COLOR INTERFERENCE OF URINE PIGMENT   BILIRUBINUR (A) 12/31/2020 1840     TEST NOT REPORTED DUE TO COLOR INTERFERENCE OF URINE PIGMENT   BILIRUBINUR neg 09/18/2014 0920   KETONESUR (A) 12/31/2020 1840    TEST NOT REPORTED DUE TO COLOR INTERFERENCE OF URINE PIGMENT   PROTEINUR (A) 12/31/2020 1840    TEST NOT REPORTED DUE TO COLOR INTERFERENCE OF URINE PIGMENT   UROBILINOGEN 0.2 05/02/2015 2020   NITRITE (A) 12/31/2020 1840    TEST NOT REPORTED DUE TO COLOR INTERFERENCE OF URINE PIGMENT   LEUKOCYTESUR (A) 12/31/2020 1840    TEST NOT REPORTED DUE TO COLOR INTERFERENCE OF URINE PIGMENT   Sepsis Labs: @LABRCNTIP (procalcitonin:4,lacticidven:4)  ) Recent Results (from the past 240 hour(s))  Urine Culture     Status: Abnormal   Collection Time: 12/31/20  5:14 PM   Specimen: Urine, Clean Catch  Result Value Ref Range Status   Specimen Description URINE, CLEAN CATCH  Final   Special Requests   Final    NONE Performed at [facility], [place] [address], [place], [place] [address]    Culture (A)  Final    >=100,000 COLONIES/mL ESCHERICHIA COLI Confirmed Extended Spectrum Beta-Lactamase Producer (ESBL).  In bloodstream infections from ESBL organisms, carbapenems are preferred over piperacillin/tazobactam. They are shown to have a lower risk of mortality. 80,000 COLONIES/mL PROTEUS MIRABILIS    Report Status 01/03/2021 FINAL  Final   Organism ID, Bacteria ESCHERICHIA COLI (A)  Final   Organism ID, Bacteria PROTEUS MIRABILIS (A)  Final      Susceptibility   Escherichia coli - MIC*    AMPICILLIN >=32 RESISTANT Resistant     CEFAZOLIN >=64 RESISTANT Resistant     CEFEPIME >=32 RESISTANT Resistant     CEFTRIAXONE >=64 RESISTANT Resistant     CIPROFLOXACIN >=4 RESISTANT Resistant     GENTAMICIN >=16 RESISTANT Resistant     IMIPENEM <=0.25 SENSITIVE Sensitive     NITROFURANTOIN 128 RESISTANT Resistant     TRIMETH/SULFA <=20 SENSITIVE Sensitive     AMPICILLIN/SULBACTAM >=32 RESISTANT Resistant     PIP/TAZO 16 SENSITIVE Sensitive     * >=100,000 COLONIES/mL  ESCHERICHIA COLI   Proteus mirabilis - MIC*    AMPICILLIN <=2 SENSITIVE Sensitive     CEFAZOLIN <=4 SENSITIVE Sensitive     CEFEPIME <=0.12 SENSITIVE Sensitive     CEFTRIAXONE <=0.25 SENSITIVE Sensitive  CIPROFLOXACIN <=0.25 SENSITIVE Sensitive     GENTAMICIN <=1 SENSITIVE Sensitive     IMIPENEM 2 SENSITIVE Sensitive     NITROFURANTOIN RESISTANT Resistant     TRIMETH/SULFA <=20 SENSITIVE Sensitive     AMPICILLIN/SULBACTAM <=2 SENSITIVE Sensitive     PIP/TAZO <=4 SENSITIVE Sensitive     * 80,000 COLONIES/mL PROTEUS MIRABILIS  Blood Culture (routine x 2)     Status: None (Preliminary result)   Collection Time: 12/31/20  6:09 PM   Specimen: Right Antecubital; Blood  Result Value Ref Range Status   Specimen Description RIGHT ANTECUBITAL  Final   Special Requests   Final    BOTTLES DRAWN AEROBIC AND ANAEROBIC Blood Culture adequate volume   Culture   Final    NO GROWTH 2 DAYS Performed at [facility], [place] [address], [place], [place] [address]    Report Status PENDING  Incomplete  Resp Panel by RT-PCR (Flu A&B, Covid) Nasopharyngeal Swab     Status: None   Collection Time: 12/31/20  6:16 PM   Specimen: Nasopharyngeal Swab; Nasopharyngeal(NP) swabs in vial transport medium  Result Value Ref Range Status   SARS Coronavirus 2 by RT PCR NEGATIVE NEGATIVE Final    Comment: (NOTE) SARS-CoV-2 target nucleic acids are NOT DETECTED.  The SARS-CoV-2 RNA is generally detectable in upper respiratory specimens during the acute phase of infection. The lowest concentration of SARS-CoV-2 viral copies this assay can detect is 138 copies/mL. A negative result does not preclude SARS-Cov-2 infection and should not be used as the sole basis for treatment or other patient management decisions. A negative result may occur with  improper specimen collection/handling, submission of specimen other than nasopharyngeal swab, presence of viral mutation(s) within the areas targeted by this assay,  and inadequate number of viral copies(<138 copies/mL). A negative result must be combined with clinical observations, patient history, and epidemiological information. The expected result is Negative.  Fact Sheet for Patients:  EntrepreneurPulse.com.au  Fact Sheet for Healthcare Providers:  IncredibleEmployment.be  This test is no t yet approved or cleared by the [place] FDA and  has been authorized for detection and/or diagnosis of SARS-CoV-2 by FDA under an Emergency Use Authorization (EUA). This EUA will remain  in effect (meaning this test can be used) for the duration of the COVID-19 declaration under Section 564(b)(1) of the Act, 21 U.S.C.section 360bbb-3(b)(1), unless the authorization is terminated  or revoked sooner.       Influenza A by PCR NEGATIVE NEGATIVE Final   Influenza B by PCR NEGATIVE NEGATIVE Final    Comment: (NOTE) The Xpert Xpress SARS-CoV-2/FLU/RSV plus assay is intended as an aid in the diagnosis of influenza from Nasopharyngeal swab specimens and should not be used as a sole basis for treatment. Nasal washings and aspirates are unacceptable for Xpert Xpress SARS-CoV-2/FLU/RSV testing.  Fact Sheet for Patients: EntrepreneurPulse.com.au  Fact Sheet for Healthcare Providers: IncredibleEmployment.be  This test is not yet approved or cleared by the [place] FDA and has been authorized for detection and/or diagnosis of SARS-CoV-2 by FDA under an Emergency Use Authorization (EUA). This EUA will remain in effect (meaning this test can be used) for the duration of the COVID-19 declaration under Section 564(b)(1) of the Act, 21 U.S.C. section 360bbb-3(b)(1), unless the authorization is terminated or revoked.  Performed at [facility], [place] [address], [place], [place] [address]   Blood Culture (routine x 2)     Status: None (Preliminary result)   Collection Time:  12/31/20  6:50 PM   Specimen: BLOOD LEFT FOREARM  Result Value Ref Range Status   Specimen Description BLOOD LEFT FOREARM  Final   Special Requests   Final    BOTTLES DRAWN AEROBIC AND ANAEROBIC Blood Culture results may not be optimal due to an inadequate volume of blood received in culture bottles   Culture   Final    NO GROWTH 2 DAYS Performed at [facility], [place] [address], [place], [place] [address]    Report Status PENDING  Incomplete      Studies: No results found.  Scheduled Meds:  amiodarone  200 mg Oral Daily   apixaban  2.5 mg Oral BID   Chlorhexidine Gluconate Cloth  6 each Topical Q0600   feeding supplement (NEPRO CARB STEADY)  237 mL Oral BID BM   multivitamin  1 tablet Oral QHS   polycarbophil  625 mg Oral BID AC    Continuous Infusions:  cefTRIAXone (ROCEPHIN)  IV 2 g (01/03/21 6979)     LOS: 2 days     Cristal Deer, MD [facility]  To reach me or the doctor on call, go to: [email or web address] Password [email or web address]  01/03/2021, 1:25 PM

## 2021-01-03 NOTE — Progress Notes (Signed)
Pharmacy Antibiotic Note  [name] is a 75 y.o. female admitted on 12/31/2020 with UTI.  Pharmacy has been consulted for meropenem dosing.  Pt presented with AMS, urinary presssure, Tmax 100, WBC up 17.3, UA with pyuria. Ucx with Ecoli ESBL and Proteus.  Pt is HD TThS but still producing urine- last HD 7/16. Pt started empirically on ceftriaxone on 7/14. Notified MD of ESBL in Ucx- requested to transition to meropenem.  Plan: Meropenem 500 mg IV q24h Monitor clinical status and HD schedule  Height: 5' 2.99" (160 cm) Weight: 65.2 kg (143 lb 11.8 oz) IBW/kg (Calculated) : 52.38  Temp (24hrs), Avg:97.9 F (36.6 C), Min:97.7 F (36.5 C), Max:98.2 F (36.8 C)  Recent Labs  Lab 12/31/20 1714 12/31/20 2318 01/01/21 0342 01/02/21 0224  WBC 20.3*  --  13.0* 17.3*  CREATININE 2.55*  --  2.93* 3.89*  LATICACIDVEN 1.6 0.9  --   --     Estimated Creatinine Clearance: 11.2 mL/min (A) (by C-G formula based on SCr of 3.89 mg/dL (H)).    Allergies  Allergen Reactions   Astemizole Nausea And Vomiting   Fluorouracil Rash   Vicodin [Hydrocodone-Acetaminophen] Nausea And Vomiting   Chlorhexidine Rash    Sunburn    rash   Percocet [Oxycodone-Acetaminophen] Nausea And Vomiting    Antimicrobials this admission: CTX 7/14 >> 7/17 Meropenem 7/17 >>   Dose adjustments this admission:   Microbiology results: 7/14 BCx: NGTD 7/14 UCx: Ecoli ESBL, Proteus mirabilis    Thank you for allowing pharmacy to be a part of this patient's care.  Willette Cluster 01/03/2021 1:37 PM

## 2021-01-04 DIAGNOSIS — N390 Urinary tract infection, site not specified: Secondary | ICD-10-CM | POA: Diagnosis not present

## 2021-01-04 DIAGNOSIS — R531 Weakness: Secondary | ICD-10-CM | POA: Diagnosis not present

## 2021-01-04 LAB — CBC
HCT: 33.7 % — ABNORMAL LOW (ref 36.0–46.0)
Hemoglobin: 10.8 g/dL — ABNORMAL LOW (ref 12.0–15.0)
MCH: 31.3 pg (ref 26.0–34.0)
MCHC: 32 g/dL (ref 30.0–36.0)
MCV: 97.7 fL (ref 80.0–100.0)
Platelets: 319 10*3/uL (ref 150–400)
RBC: 3.45 MIL/uL — ABNORMAL LOW (ref 3.87–5.11)
RDW: 16.4 % — ABNORMAL HIGH (ref 11.5–15.5)
WBC: 16.2 10*3/uL — ABNORMAL HIGH (ref 4.0–10.5)
nRBC: 0 % (ref 0.0–0.2)

## 2021-01-04 MED ORDER — PROSOURCE PLUS PO LIQD
30.0000 mL | Freq: Two times a day (BID) | ORAL | Status: DC
  Filled 2021-01-04 (×5): qty 30

## 2021-01-04 NOTE — Progress Notes (Addendum)
[facility] Progress Note   Subjective:   Seen in room. No overnight issues. No CP, dyspnea.  Objective Vitals:   01/03/21 0732 01/03/21 1506 01/03/21 2034 01/04/21 0817  BP: (!) 138/56 (!) 115/44 (!) 119/56 (!) 138/54  Pulse: 70 70 70 74  Resp: 16  18 18   Temp: 97.7 F (36.5 C) 98.1 F (36.7 C) 98.4 F (36.9 C) 99.1 F (37.3 C)  TempSrc: Oral Oral Oral Oral  SpO2: 95% 93% 96% 94%  Weight:      Height:       Physical Exam General: Well appearing woman, NAD. Nasal O2 in place Heart: RRR; 4/6 systolic murmur Lungs: CTAB Abdomen: soft, non-tender Extremities: 1+ BLE edema Dialysis Access: TDC in R chest, LUE AVF + bruit  Additional Objective Labs: Basic Metabolic Panel: Recent Labs  Lab 12/31/20 1714 01/01/21 0342 01/02/21 0224  NA 133* 134* 131*  K 3.1* 3.1* 4.2  CL 92* 95* 93*  CO2 31 32 26  GLUCOSE 115* 83 79  BUN 10 13 21   CREATININE 2.55* 2.93* 3.89*  CALCIUM 7.8* 7.9* 8.2*  PHOS  --  2.9  --    Liver Function Tests: Recent Labs  Lab 12/31/20 1714 01/01/21 0342  AST 13*  --   ALT 10  --   ALKPHOS 87  --   BILITOT 0.5  --   PROT 4.6*  --   ALBUMIN 1.8* 1.6*   CBC: Recent Labs  Lab 12/31/20 1714 01/01/21 0342 01/02/21 0224 01/03/21 1842  WBC 20.3* 13.0* 17.3* 16.2*  NEUTROABS 18.5*  --   --   --   HGB 9.2* 8.1* 9.7* 10.8*  HCT 29.0* 25.4* 30.6* 33.7*  MCV 98.0 96.9 97.1 97.7  PLT 267 240 279 319   Medications:  meropenem (MERREM) IV 500 mg (01/03/21 1538)    amiodarone  200 mg Oral Daily   apixaban  2.5 mg Oral BID   Chlorhexidine Gluconate Cloth  6 each Topical Q0600   feeding supplement (NEPRO CARB STEADY)  237 mL Oral BID BM   hydrocortisone  25 mg Rectal BID   multivitamin  1 tablet Oral QHS   polycarbophil  625 mg Oral BID AC    Dialysis Orders: [facility] TTS 4h  400/500  72kg   2/2 bath P2  Hep none  AVF and TDC, either may be used - mircera 60ug q2 wks, last 7/14  Assessment/Plan: ESBL E.coli + Proteus UTI:  On meropenem, per primary. Confusion/weakness appear to have resolved.  ESRD: Continue HD per usual TTS schedule -> next tomorrow 7/19. Hypertension/volume: Well below prior dry weight, + edema - UF as tolerated.  Likely due to profound hypoalbuminemia and third spacing.   Will continue to UF as bp tolerates and challenge edw.  Anemia of ESRD: Hgb 10.8 - not due for ESA yet.    Metabolic bone disease: Ca/Phos ok. No binders or VDRA at this time - follow.  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 Chronic combined CHF -- volume removal with UF on HD Paroxysmal A-Fib: On Eliquis/amiodarone. Severe aortic stenosis - f/b cardiology Nutrition: Alb very low, continue supplements. Diarrhea - likely due to antibiotics but should check for C. Diff.   [name], PA-C 01/04/2021, 9:37 AM  [facility]   I have seen and examined this patient and agree with plan and assessment in the above note with renal recommendations/intervention highlighted.  Broadus John A Charelle Petrakis,MD 01/04/2021 10:48 AM

## 2021-01-04 NOTE — Progress Notes (Signed)
Physical Therapy Treatment Patient Details Name: [name] MRN: [id number] DOB: August 20, 1945 Today's Date: 01/04/2021    History of Present Illness The pt is a 75 y.o. female  presenting 7/14 with c/o weakness, lethargy, and delirium following HD session earlier in the day. Upon workup, pt found to have UTI. PMH includes: recent admission for LUE hematoma with concern for compartment syndrome, arthritis, cardiomyopathy, CHF, ESRD, GERD, HLD, HTN, MI, a fib, L THA and L TKA.    PT Comments    Pt supine in bed on arrival this session.  Pt continues to benefit from skilled rehab in a post acute setting.  Per daughter patient ambulating with a RW in June.  Will update recommendation to CIR in efforts to restore function to ease care giver burden before returning home.  Will update supervising PT of change in recommendation at this time    Follow Up Recommendations  CIR (if denied CIR placement will return home as patient and family refuse SNF placement.)     Equipment Recommendations  Other (comment) (drop arm bedside commode.)    Recommendations for Other Services       Precautions / Restrictions Precautions Precautions: Fall Precaution Comments: incontinent of urine and BM-concerning purulent discharge and stool appeared bloody. Restrictions Weight Bearing Restrictions: No    Mobility  Bed Mobility Overal bed mobility: Needs Assistance Bed Mobility: Rolling;Sidelying to Sit;Sit to Sidelying Rolling: Mod assist;+2 for safety/equipment Sidelying to sit: Mod assist;+2 for safety/equipment       General bed mobility comments: Pt performed rolling to R and L side for pericare and new pad placement.  Pt required assistance for trunk elevation and increased time with assistance to scoot to the edge of the bed.    Transfers Overall transfer level: Needs assistance Equipment used: Ambulation equipment used (sara stedy) Transfers: Sit to/from Stand Sit to Stand: Mod assist;+2  physical assistance;From elevated surface         General transfer comment: Cues for hand placement and pushing through B LEs.  Attempts to rise with decreased hip extension and unable to achieve full stance.  Pt was able to lift hips enough with assistance to place sara stedy flaps.  Ambulation/Gait Ambulation/Gait assistance:  (unable.)               Stairs             Wheelchair Mobility    Modified Rankin (Stroke Patients Only)       Balance Overall balance assessment: Needs assistance Sitting-balance support: Bilateral upper extremity supported;Feet supported Sitting balance-Leahy Scale: Poor Sitting balance - Comments: reliant on BUE support     Standing balance-Leahy Scale: Poor                              Cognition Arousal/Alertness: Awake/alert Behavior During Therapy: Anxious Overall Cognitive Status: No family/caregiver present to determine baseline cognitive functioning Area of Impairment: Safety/judgement;Problem solving                         Safety/Judgement: Decreased awareness of deficits   Problem Solving: Decreased initiation;Requires verbal cues General Comments: Pt continues to self limit but with encouragement she was agreeable to attempt standing.  Pt continues to require increased time and step by step cues.      Exercises      General Comments        Pertinent Vitals/Pain Pain Assessment: Faces  Faces Pain Scale: Hurts little more Pain Location: generalized Pain Descriptors / Indicators: Discomfort;Moaning;Sore Pain Intervention(s): Monitored during session;Repositioned    Home Living                      Prior Function            PT Goals (current goals can now be found in the care plan section) Acute Rehab PT Goals Patient Stated Goal: return home Potential to Achieve Goals: Fair Progress towards PT goals: Progressing toward goals    Frequency    Min 5X/week      PT  Plan Discharge plan needs to be updated    Co-evaluation PT/OT/SLP Co-Evaluation/Treatment: Yes Reason for Co-Treatment: Complexity of the patient's impairments (multi-system involvement);To address functional/ADL transfers (required two skilled therapists to progress to standing as she has not be ambulating since june.) PT goals addressed during session: Mobility/safety with mobility;Balance OT goals addressed during session: ADL's and self-care      AM-PAC PT "6 Clicks" Mobility   Outcome Measure  Help needed turning from your back to your side while in a flat bed without using bedrails?: A Little Help needed moving from lying on your back to sitting on the side of a flat bed without using bedrails?: A Little Help needed moving to and from a bed to a chair (including a wheelchair)?: A Lot Help needed standing up from a chair using your arms (e.g., wheelchair or bedside chair)?: A Lot Help needed to walk in hospital room?: Total Help needed climbing 3-5 steps with a railing? : Total 6 Click Score: 12    End of Session Equipment Utilized During Treatment: Gait belt Activity Tolerance: Patient tolerated treatment well;Patient limited by fatigue;Patient limited by pain Patient left: with bed alarm set;in chair;with chair alarm set;with family/visitor present Nurse Communication: Mobility status;Need for lift equipment PT Visit Diagnosis: Other abnormalities of gait and mobility (R26.89);Muscle weakness (generalized) (M62.81);Pain     Time: 9728-2060 PT Time Calculation (min) (ACUTE ONLY): 28 min  Charges:  $Therapeutic Activity: 8-22 mins                     Erasmo Leventhal , PTA Acute Rehabilitation Services Pager [phone] Office [phone]    Brytani Voth [name] 01/04/2021, 12:15 PM

## 2021-01-04 NOTE — Progress Notes (Signed)
Occupational Therapy Treatment Patient Details Name: [name] MRN: [id number] DOB: July 16, 1945 Today's Date: 01/04/2021    History of present illness The pt is a 75 y.o. female  presenting 7/14 with c/o weakness, lethargy, and delirium following HD session earlier in the day. Upon workup, pt found to have UTI. PMH includes: recent admission for LUE hematoma with concern for compartment syndrome, arthritis, cardiomyopathy, CHF, ESRD, GERD, HLD, HTN, MI, a fib, L THA and L TKA.   OT comments  Pt making progress with functional goals. Pt's daughter present and very supportive. Session focused on be mobility to sit EOB, sit - stand transitions with Stedy, transfer to recliner for UB dressing and LB bathing tasks, grooming/hygiene.  Per pt's daughter, pt was ambulating with a RW in June.  Updating d/c recommendation to CIR to restore function to ease care giver burden before returning home. OT will continue to follow acutely to maximize level of function and safety  Follow Up Recommendations  CIR;Other (comment) (will need max HH therapies if denied by CIR as pt and daughter refusing SNF as an option)    Equipment Recommendations  None recommended by OT    Recommendations for Other Services      Precautions / Restrictions Precautions Precautions: Fall Precaution Comments: incontinent of urine and BM-concerning purulent discharge and stool appeared bloody. Restrictions Weight Bearing Restrictions: No       Mobility Bed Mobility Overal bed mobility: Needs Assistance Bed Mobility: Rolling;Sidelying to Sit;Sit to Sidelying Rolling: Mod assist;+2 for safety/equipment Sidelying to sit: Mod assist;+2 for safety/equipment       General bed mobility comments: Pt performed rolling to R and L side for pericare and new pad placement.  Pt required assistance for trunk elevation and increased time with assistance to scoot to the edge of the bed.    Transfers Overall transfer level: Needs  assistance Equipment used: Ambulation equipment used Transfers: Sit to/from Stand Sit to Stand: Mod assist;+2 physical assistance;From elevated surface         General transfer comment: Cues for hand placement and pushing through B LEs.  Attempts to rise with decreased hip extension and unable to achieve full stance.  Pt was able to lift hips enough with assistance to place sara stedy flaps.    Balance Overall balance assessment: Needs assistance Sitting-balance support: Bilateral upper extremity supported;Feet supported Sitting balance-Leahy Scale: Poor Sitting balance - Comments: reliant on BUE support   Standing balance support: Bilateral upper extremity supported Standing balance-Leahy Scale: Poor Standing balance comment: with Denna Haggard                           ADL either performed or assessed with clinical judgement   ADL Overall ADL's : Needs assistance/impaired     Grooming: Wash/dry hands;Wash/dry face;Sitting;Set up;Supervision/safety       Lower Body Bathing: Minimal assistance Lower Body Bathing Details (indicate cue type and reason): simulated seated in recliner Upper Body Dressing : Minimal assistance;Sitting       Toilet Transfer: Moderate assistance;+2 for safety/equipment Toilet Transfer Details (indicate cue type and reason): simulated to recliner [place] and Hygiene: Total assistance;Bed level       Functional mobility during ADLs: Moderate assistance;+2 for physical assistance [name])       Vision Baseline Vision/History: Wears glasses Patient Visual Report: No change from baseline     Perception     Praxis  Cognition Arousal/Alertness: Awake/alert Behavior During Therapy: Anxious Overall Cognitive Status: Within Functional Limits for tasks assessed Area of Impairment: Safety/judgement;Problem solving                         Safety/Judgement: Decreased awareness of  deficits   Problem Solving: Decreased initiation;Requires verbal cues General Comments: Pt continues to self limit but with encouragement she was agreeable to attempt standing.  Pt continues to require increased time and step by step cues.        Exercises     Shoulder Instructions       General Comments      Pertinent Vitals/ Pain       Pain Assessment: Faces Faces Pain Scale: Hurts little more Pain Location: generalized Pain Descriptors / Indicators: Discomfort;Moaning;Sore Pain Intervention(s): Monitored during session;Repositioned  Home Living     Available Help at Discharge: Family;Available 24 hours/day                                Lives With: Daughter    Prior Functioning/Environment          Comments: Has not driven in year due to knee surgery   Frequency  Min 2X/week        Progress Toward Goals  OT Goals(current goals can now be found in the care plan section)  Progress towards OT goals: Progressing toward goals  Acute Rehab OT Goals Patient Stated Goal: return home  Plan Discharge plan remains appropriate    Co-evaluation    PT/OT/SLP Co-Evaluation/Treatment: Yes Reason for Co-Treatment: Complexity of the patient's impairments (multi-system involvement);For patient/therapist safety;To address functional/ADL transfers PT goals addressed during session: Mobility/safety with mobility;Balance OT goals addressed during session: ADL's and self-care;Proper use of Adaptive equipment and DME      AM-PAC OT "6 Clicks" Daily Activity     Outcome Measure   Help from another person eating meals?: None Help from another person taking care of personal grooming?: A Little Help from another person toileting, which includes using toliet, bedpan, or urinal?: Total Help from another person bathing (including washing, rinsing, drying)?: A Lot Help from another person to put on and taking off regular upper body clothing?: A Little Help from  another person to put on and taking off regular lower body clothing?: A Lot 6 Click Score: 15    End of Session Equipment Utilized During Treatment: Gait belt;Other (comment) [name])  OT Visit Diagnosis: Other abnormalities of gait and mobility (R26.89);Muscle weakness (generalized) (M62.81);Pain Pain - part of body:  (generalized, abdomen)   Activity Tolerance Patient limited by fatigue   Patient Left with call bell/phone within reach;in chair;with chair alarm set;with bed alarm set;with family/visitor present   Nurse Communication          Time: 6333-5456 OT Time Calculation (min): 34 min  Charges: OT General Charges $OT Visit: 1 Visit OT Treatments $Self Care/Home Management : 8-22 mins     Britt Bottom 01/04/2021, 3:16 PM

## 2021-01-04 NOTE — Progress Notes (Signed)
PROGRESS NOTE  [name] UXL:[id number] DOB: 08/25/1945 DOA: 12/31/2020 PCP: Lajean Manes, MD   LOS: 3 days   Brief Narrative / Interim history: 75 year old female with history of ESRD on HD TTS, severe aortic stenosis, HFpEF 55 to 60%, paroxysmal A. fib on Eliquis, left hip fracture s/p repair in December 2021, who presented to [facility] ED from home on December 31, 2020 due to generalized weakness, associated with 1 episode of nausea and vomiting and chills after hemodialysis the day of presentation.  In the ER patient reported urinary pressure and she was febrile with temperature of 103 and leukocytosis of 20,000.  She was started on IV Rocephin antibiotics and admitted to inpatient with telemetry  Subjective / 24h Interval events: She is doing well this morning, complains of significant weakness but appreciates that her thinking is clearer now  Assessment & Plan: Principal Problem Sepsis due to ESBL UTI-patient febrile in the ED, increased WBC, as well as positive UA and symptomatic -She was initially maintained on ceftriaxone however speciated ESBL E. coli along with Proteus -Started on meropenem 7/17, ceftriaxone now discontinued.  Favor to do at least 3 days of IV meropenem, today day #2, if continues with improvement can potentially be given fosfomycin upon discharge.  Active Problems Acute metabolic encephalopathy, generalized weakness -her encephalopathy seems to have resolved.  She still complains of persistent weakness, PT to see  ESRD-nephrology consulted  Anemia of chronic kidney disease-no bleeding, fecal occult was positive but hemoglobin overall stable.  On 7/17 apparently had some small amounts of blood in her stool.  Closely monitor  Diarrhea-Per RN, has been having loose bowel movements throughout the day yesterday.  Abdominal exam is fairly benign, continue to closely monitor.  Possibly simply a side effect from antibiotics argatroban  SVT-no recurrence, monitor mag and  K  Severe aortic valve stenosis-follow-up with cardiology as an outpatient  Positive fecal occult-no clear-cut bleeding, has a history of a hemorrhoid, CBC is stable and hemoglobin actually went up.  Continue to monitor.  Hold on inpatient GI consultation for now  Scheduled Meds:  (feeding supplement) PROSource Plus  30 mL Oral BID BM   amiodarone  200 mg Oral Daily   apixaban  2.5 mg Oral BID   Chlorhexidine Gluconate Cloth  6 each Topical Q0600   feeding supplement (NEPRO CARB STEADY)  237 mL Oral BID BM   hydrocortisone  25 mg Rectal BID   multivitamin  1 tablet Oral QHS   polycarbophil  625 mg Oral BID AC   Continuous Infusions:  meropenem (MERREM) IV 500 mg (01/03/21 1538)   PRN Meds:.acetaminophen, hydrOXYzine, simethicone, witch hazel-glycerin  Diet Orders (From admission, onward)     Start     Ordered   12/31/20 2215  Diet renal 60/70-07-22-1198 Fluid restriction: 1200 mL Fluid; Room service appropriate? Yes; Fluid consistency: Thin  Diet effective now       Question Answer Comment  Fluid restriction: 1200 mL Fluid   Room service appropriate? Yes   Fluid consistency: Thin      12/31/20 2214            DVT prophylaxis: apixaban (ELIQUIS) tablet 2.5 mg Start: 01/01/21 1000 SCDs Start: 12/31/20 2058 apixaban (ELIQUIS) tablet 2.5 mg     Code Status: Full Code  Family Communication: no family at bedside   Status is: Inpatient  Remains inpatient appropriate because:Inpatient level of care appropriate due to severity of illness  Dispo:  Patient From: Home  Planned Disposition: Home  with Health Care Svc  Medically stable for discharge: No         Level of care: Telemetry Medical  Consultants:  none  Procedures:  None   Microbiology  ESBL Ecoli, proteus - urine culture  Antimicrobials: Meropenem 7/17 >>    Objective: Vitals:   01/03/21 0732 01/03/21 1506 01/03/21 2034 01/04/21 0817  BP: (!) 138/56 (!) 115/44 (!) 119/56 (!) 138/54  Pulse: 70  70 70 74  Resp: 16  18 18   Temp: 97.7 F (36.5 C) 98.1 F (36.7 C) 98.4 F (36.9 C) 99.1 F (37.3 C)  TempSrc: Oral Oral Oral Oral  SpO2: 95% 93% 96% 94%  Weight:      Height:        Intake/Output Summary (Last 24 hours) at 01/04/2021 1001 Last data filed at 01/04/2021 0818 Gross per 24 hour  Intake 220 ml  Output --  Net 220 ml   Filed Weights   01/02/21 1514 01/02/21 1855  Weight: 66.8 kg 65.2 kg    Examination:  Constitutional: NAD Eyes: no scleral icterus ENMT: Mucous membranes are moist.  Neck: normal, supple Respiratory: clear to auscultation bilaterally, no wheezing, no crackles.  Cardiovascular: Regular rate and rhythm, 3/6 SEM. No LE edema. Abdomen: non distended, no tenderness. Bowel sounds positive.  Musculoskeletal: no clubbing / cyanosis.  Skin: no rashes Neurologic: non focal   Data Reviewed: I have independently reviewed following labs and imaging studies   CBC: Recent Labs  Lab 12/31/20 1714 01/01/21 0342 01/02/21 0224 01/03/21 1842  WBC 20.3* 13.0* 17.3* 16.2*  NEUTROABS 18.5*  --   --   --   HGB 9.2* 8.1* 9.7* 10.8*  HCT 29.0* 25.4* 30.6* 33.7*  MCV 98.0 96.9 97.1 97.7  PLT 267 240 279 562   Basic Metabolic Panel: Recent Labs  Lab 12/31/20 1714 01/01/21 0342 01/02/21 0224  NA 133* 134* 131*  K 3.1* 3.1* 4.2  CL 92* 95* 93*  CO2 31 32 26  GLUCOSE 115* 83 79  BUN 10 13 21   CREATININE 2.55* 2.93* 3.89*  CALCIUM 7.8* 7.9* 8.2*  MG  --   --  2.2  PHOS  --  2.9  --    Liver Function Tests: Recent Labs  Lab 12/31/20 1714 01/01/21 0342  AST 13*  --   ALT 10  --   ALKPHOS 87  --   BILITOT 0.5  --   PROT 4.6*  --   ALBUMIN 1.8* 1.6*   Coagulation Profile: Recent Labs  Lab 12/31/20 1714  INR 1.8*   HbA1C: No results for input(s): HGBA1C in the last 72 hours. CBG: No results for input(s): GLUCAP in the last 168 hours.  Recent Results (from the past 240 hour(s))  Urine Culture     Status: Abnormal   Collection Time:  12/31/20  5:14 PM   Specimen: Urine, Clean Catch  Result Value Ref Range Status   Specimen Description URINE, CLEAN CATCH  Final   Special Requests   Final    NONE Performed at [facility], [address] [place], [place] [address]    Culture (A)  Final    >=100,000 COLONIES/mL ESCHERICHIA COLI Confirmed Extended Spectrum Beta-Lactamase Producer (ESBL).  In bloodstream infections from ESBL organisms, carbapenems are preferred over piperacillin/tazobactam. They are shown to have a lower risk of mortality. 80,000 COLONIES/mL PROTEUS MIRABILIS    Report Status 01/03/2021 FINAL  Final   Organism ID, Bacteria ESCHERICHIA COLI (A)  Final  Organism ID, Bacteria PROTEUS MIRABILIS (A)  Final      Susceptibility   Escherichia coli - MIC*    AMPICILLIN >=32 RESISTANT Resistant     CEFAZOLIN >=64 RESISTANT Resistant     CEFEPIME >=32 RESISTANT Resistant     CEFTRIAXONE >=64 RESISTANT Resistant     CIPROFLOXACIN >=4 RESISTANT Resistant     GENTAMICIN >=16 RESISTANT Resistant     IMIPENEM <=0.25 SENSITIVE Sensitive     NITROFURANTOIN 128 RESISTANT Resistant     TRIMETH/SULFA <=20 SENSITIVE Sensitive     AMPICILLIN/SULBACTAM >=32 RESISTANT Resistant     PIP/TAZO 16 SENSITIVE Sensitive     * >=100,000 COLONIES/mL ESCHERICHIA COLI   Proteus mirabilis - MIC*    AMPICILLIN <=2 SENSITIVE Sensitive     CEFAZOLIN <=4 SENSITIVE Sensitive     CEFEPIME <=0.12 SENSITIVE Sensitive     CEFTRIAXONE <=0.25 SENSITIVE Sensitive     CIPROFLOXACIN <=0.25 SENSITIVE Sensitive     GENTAMICIN <=1 SENSITIVE Sensitive     IMIPENEM 2 SENSITIVE Sensitive     NITROFURANTOIN RESISTANT Resistant     TRIMETH/SULFA <=20 SENSITIVE Sensitive     AMPICILLIN/SULBACTAM <=2 SENSITIVE Sensitive     PIP/TAZO <=4 SENSITIVE Sensitive     * 80,000 COLONIES/mL PROTEUS MIRABILIS  Blood Culture (routine x 2)     Status: None (Preliminary result)   Collection Time: 12/31/20  6:09 PM   Specimen: Right Antecubital; Blood   Result Value Ref Range Status   Specimen Description RIGHT ANTECUBITAL  Final   Special Requests   Final    BOTTLES DRAWN AEROBIC AND ANAEROBIC Blood Culture adequate volume   Culture   Final    NO GROWTH 4 DAYS Performed at [facility], [address] [place], [place] [address]    Report Status PENDING  Incomplete  Resp Panel by RT-PCR (Flu A&B, Covid) Nasopharyngeal Swab     Status: None   Collection Time: 12/31/20  6:16 PM   Specimen: Nasopharyngeal Swab; Nasopharyngeal(NP) swabs in vial transport medium  Result Value Ref Range Status   SARS Coronavirus 2 by RT PCR NEGATIVE NEGATIVE Final    Comment: (NOTE) SARS-CoV-2 target nucleic acids are NOT DETECTED.  The SARS-CoV-2 RNA is generally detectable in upper respiratory specimens during the acute phase of infection. The lowest concentration of SARS-CoV-2 viral copies this assay can detect is 138 copies/mL. A negative result does not preclude SARS-Cov-2 infection and should not be used as the sole basis for treatment or other patient management decisions. A negative result may occur with  improper specimen collection/handling, submission of specimen other than nasopharyngeal swab, presence of viral mutation(s) within the areas targeted by this assay, and inadequate number of viral copies(<138 copies/mL). A negative result must be combined with clinical observations, patient history, and epidemiological information. The expected result is Negative.  Fact Sheet for Patients:  EntrepreneurPulse.com.au  Fact Sheet for Healthcare Providers:  IncredibleEmployment.be  This test is no t yet approved or cleared by the [place] FDA and  has been authorized for detection and/or diagnosis of SARS-CoV-2 by FDA under an Emergency Use Authorization (EUA). This EUA will remain  in effect (meaning this test can be used) for the duration of the COVID-19 declaration under Section 564(b)(1) of  the Act, 21 U.S.C.section 360bbb-3(b)(1), unless the authorization is terminated  or revoked sooner.       Influenza A by PCR NEGATIVE NEGATIVE Final   Influenza B by PCR NEGATIVE NEGATIVE Final    Comment: (  NOTE) The Xpert Xpress SARS-CoV-2/FLU/RSV plus assay is intended as an aid in the diagnosis of influenza from Nasopharyngeal swab specimens and should not be used as a sole basis for treatment. Nasal washings and aspirates are unacceptable for Xpert Xpress SARS-CoV-2/FLU/RSV testing.  Fact Sheet for Patients: EntrepreneurPulse.com.au  Fact Sheet for Healthcare Providers: IncredibleEmployment.be  This test is not yet approved or cleared by the [place] FDA and has been authorized for detection and/or diagnosis of SARS-CoV-2 by FDA under an Emergency Use Authorization (EUA). This EUA will remain in effect (meaning this test can be used) for the duration of the COVID-19 declaration under Section 564(b)(1) of the Act, 21 U.S.C. section 360bbb-3(b)(1), unless the authorization is terminated or revoked.  Performed at [facility], [place] [address], [place], [place] [address]   Blood Culture (routine x 2)     Status: None (Preliminary result)   Collection Time: 12/31/20  6:50 PM   Specimen: BLOOD LEFT FOREARM  Result Value Ref Range Status   Specimen Description BLOOD LEFT FOREARM  Final   Special Requests   Final    BOTTLES DRAWN AEROBIC AND ANAEROBIC Blood Culture results may not be optimal due to an inadequate volume of blood received in culture bottles   Culture   Final    NO GROWTH 4 DAYS Performed at [facility], [place] [address] [place], [place] [address]    Report Status PENDING  Incomplete     Radiology Studies: No results found.   Marzetta Board, MD, PhD [facility]  Between 7 am - 7 pm I am available, please contact me via Amion (for emergencies) or Securechat (non urgent messages)  Between 7 pm -  7 am I am not available, please contact night coverage MD/APP via Amion

## 2021-01-04 NOTE — Progress Notes (Signed)
Inpatient Rehabilitation Admissions Coordinator   Inpatient rehab consult received. I met with patient and her daughter at bedside. She has recently been admitted to CIR and would like me to pursue admit again. Once I have updated OT assessment I will begin Auth with [employer] for a possible admit.   , RN, MSN Rehab Admissions Coordinator [phone] 01/04/2021 1:22 PM  

## 2021-01-05 ENCOUNTER — Ambulatory Visit: Payer: Self-pay | Admitting: *Deleted

## 2021-01-05 DIAGNOSIS — N390 Urinary tract infection, site not specified: Secondary | ICD-10-CM | POA: Diagnosis not present

## 2021-01-05 DIAGNOSIS — G9341 Metabolic encephalopathy: Secondary | ICD-10-CM | POA: Diagnosis not present

## 2021-01-05 DIAGNOSIS — I350 Nonrheumatic aortic (valve) stenosis: Secondary | ICD-10-CM

## 2021-01-05 DIAGNOSIS — A419 Sepsis, unspecified organism: Secondary | ICD-10-CM

## 2021-01-05 DIAGNOSIS — K529 Noninfective gastroenteritis and colitis, unspecified: Secondary | ICD-10-CM

## 2021-01-05 LAB — HEPATITIS B SURFACE ANTIGEN: Hepatitis B Surface Ag: NONREACTIVE

## 2021-01-05 LAB — BASIC METABOLIC PANEL
Anion gap: 7 (ref 5–15)
BUN: 25 mg/dL — ABNORMAL HIGH (ref 8–23)
CO2: 27 mmol/L (ref 22–32)
Calcium: 8 mg/dL — ABNORMAL LOW (ref 8.9–10.3)
Chloride: 98 mmol/L (ref 98–111)
Creatinine, Ser: 4.23 mg/dL — ABNORMAL HIGH (ref 0.44–1.00)
GFR, Estimated: 10 mL/min — ABNORMAL LOW (ref >60)
Glucose, Bld: 84 mg/dL (ref 70–99)
Potassium: 3.3 mmol/L — ABNORMAL LOW (ref 3.5–5.1)
Sodium: 132 mmol/L — ABNORMAL LOW (ref 135–145)

## 2021-01-05 LAB — CBC
HCT: 26.9 % — ABNORMAL LOW (ref 36.0–46.0)
Hemoglobin: 8.8 g/dL — ABNORMAL LOW (ref 12.0–15.0)
MCH: 31.4 pg (ref 26.0–34.0)
MCHC: 32.7 g/dL (ref 30.0–36.0)
MCV: 96.1 fL (ref 80.0–100.0)
Platelets: 262 10*3/uL (ref 150–400)
RBC: 2.8 MIL/uL — ABNORMAL LOW (ref 3.87–5.11)
RDW: 16.7 % — ABNORMAL HIGH (ref 11.5–15.5)
WBC: 13.2 10*3/uL — ABNORMAL HIGH (ref 4.0–10.5)
nRBC: 0 % (ref 0.0–0.2)

## 2021-01-05 LAB — CULTURE, BLOOD (ROUTINE X 2)
Culture: NO GROWTH
Culture: NO GROWTH
Special Requests: ADEQUATE

## 2021-01-05 MED ORDER — PHENAZOPYRIDINE HCL 200 MG PO TABS
200.0000 mg | ORAL_TABLET | Freq: Three times a day (TID) | ORAL | Status: DC
  Administered 2021-01-05 – 2021-01-06 (×4): 200 mg via ORAL
  Filled 2021-01-05 (×6): qty 1

## 2021-01-05 MED ORDER — HEPARIN SODIUM (PORCINE) 1000 UNIT/ML IJ SOLN
INTRAMUSCULAR | Status: AC
  Filled 2021-01-05: qty 4

## 2021-01-05 NOTE — Assessment & Plan Note (Signed)
-   Continue dialysis per nephrology °

## 2021-01-05 NOTE — Progress Notes (Signed)
PT Cancellation Note  Patient Details Name: [name] MRN: [id number] DOB: 04-23-1946   Cancelled Treatment:    Reason Eval/Treat Not Completed: (P) Patient at procedure or test/unavailable (Pt off unit for HD will f/u per POC.)   Kieon Lawhorn J Stann Mainland 01/05/2021, 9:49 AM  Erasmo Leventhal , PTA Acute Rehabilitation Services Pager [phone] Office [phone]

## 2021-01-05 NOTE — Progress Notes (Addendum)
Physical Therapy Treatment Patient Details Name: [name] MRN: [id number] DOB: 02/16/1946 Today's Date: 01/05/2021    History of Present Illness The pt is a 75 y.o. female  presenting 7/14 with c/o weakness, lethargy, and delirium following HD session earlier in the day. Upon workup, pt found to have UTI. PMH includes: recent admission for LUE hematoma with concern for compartment syndrome, arthritis, cardiomyopathy, CHF, ESRD, GERD, HLD, HTN, MI, a fib, L THA and L TKA.    PT Comments    Pt supine in bed on arrival and very fatigued from dialysis. Pt continues to require +2 external assistance to mobilize and very limited due to incontinence.  She was denied CIR placement so will update recommendations to SNF however family is adamant to return patient home if she does not qualify for CIR.  Will continue to follow acutely to maximize functional gains before returning home.  PTA will inform supervising PT of need for change in recommendations based on CIR denial.     Follow Up Recommendations  SNF (will require HHPT as patient is refusing SNF.)     Equipment Recommendations  Other (comment) (drop arm commode.)    Recommendations for Other Services       Precautions / Restrictions Precautions Precautions: Fall Precaution Comments: Incontinent Restrictions Weight Bearing Restrictions: No    Mobility  Bed Mobility Overal bed mobility: Needs Assistance Bed Mobility: Rolling;Sidelying to Sit;Sit to Sidelying Rolling: Mod assist;+2 for safety/equipment Sidelying to sit: Mod assist;+2 for safety/equipment       General bed mobility comments: Pt performed rolling to R and L side for pericare and new pad placement.  Pt required assistance for trunk elevation and increased time with assistance to scoot to the edge of the bed.    Transfers Overall transfer level: Needs assistance Equipment used: Rolling walker (2 wheeled);None Transfers: Sit to/from [employer] Sit to Stand: Mod assist;+2 safety/equipment        Lateral/Scoot Transfers: Max assist;+2 safety/equipment General transfer comment: Pt performed x 2 standing trials on RW with mod assistance +2 for safety.  Pt with weak quads this session and standing trial is very short and limited.  Pt with bowel incontinence during standing trials.  Returned to seated position and then performed face to face transfer with stand pivot assistance.  This was performed bed to commode and back to bed.  Pt very weak.  Ambulation/Gait                 Stairs             Wheelchair Mobility    Modified Rankin (Stroke Patients Only)       Balance Overall balance assessment: Needs assistance Sitting-balance support: Bilateral upper extremity supported;Feet supported Sitting balance-Leahy Scale: Poor       Standing balance-Leahy Scale: Poor                              Cognition Arousal/Alertness: Awake/alert Behavior During Therapy: Anxious Overall Cognitive Status: Within Functional Limits for tasks assessed                                 General Comments: Pt continues to self limit but with encouragement she was agreeable to attempt standing.  Pt continues to require increased time and step by step cues.      Exercises  General Comments        Pertinent Vitals/Pain Pain Assessment: Faces Faces Pain Scale: Hurts even more Pain Location: generalized Pain Descriptors / Indicators: Discomfort;Moaning;Sore Pain Intervention(s): Monitored during session;Repositioned    Home Living                      Prior Function            PT Goals (current goals can now be found in the care plan section) Acute Rehab PT Goals Patient Stated Goal: return home Potential to Achieve Goals: Fair Progress towards PT goals: Progressing toward goals    Frequency    Min 5X/week      PT Plan Discharge plan needs to be updated     Co-evaluation              AM-PAC PT "6 Clicks" Mobility   Outcome Measure  Help needed turning from your back to your side while in a flat bed without using bedrails?: A Lot Help needed moving from lying on your back to sitting on the side of a flat bed without using bedrails?: A Lot Help needed moving to and from a bed to a chair (including a wheelchair)?: A Lot Help needed standing up from a chair using your arms (e.g., wheelchair or bedside chair)?: A Lot Help needed to walk in hospital room?: Total Help needed climbing 3-5 steps with a railing? : Total 6 Click Score: 10    End of Session Equipment Utilized During Treatment: Gait belt Activity Tolerance: Patient tolerated treatment well;Patient limited by fatigue;Patient limited by pain Patient left: with bed alarm set;with family/visitor present;in bed;with call bell/phone within reach Nurse Communication: Mobility status;Need for lift equipment PT Visit Diagnosis: Other abnormalities of gait and mobility (R26.89);Muscle weakness (generalized) (M62.81);Pain     Time: 9326-7124 PT Time Calculation (min) (ACUTE ONLY): 29 min  Charges:  $Therapeutic Activity: 23-37 mins                     Erasmo Leventhal , PTA Acute Rehabilitation Services Pager [phone] Office [phone]    Farzad Tibbetts [name] 01/05/2021, 4:00 PM

## 2021-01-05 NOTE — Hospital Course (Addendum)
[name] is a 75 yo female with PMH ESRD on HD, severe AS, HFpEF, PAF on Eliquis who presented to the ER after she developed weakness, N/V.  On workup she was found to have a fever, leukocytosis, and urine specimen notable for elevated WBCs and bacteria.  There was also noted confusion from the patient on admission.  She was initially started on Rocephin and urine cultures speciated to E. coli ESBL and Proteus.  She was transitioned to meropenem. There was also some report of blood mixed in her stool.  Hemoglobin was stable on admission.  FOBT was positive.  Due to stability of hemoglobin, decision was made for monitoring however after ongoing mixed bloody stools, GI was consulted on 7/20.   See below for further problem based plan

## 2021-01-05 NOTE — Assessment & Plan Note (Signed)
-   patient symptoms include AMS - etiology considered due to metabolic from sepsis/UTI - now back to baseline

## 2021-01-05 NOTE — Progress Notes (Addendum)
Inpatient Rehabilitation Admissions Coordinator   I await insurance determination for a possible Cir admit.  Danne Baxter, RN, MSN Rehab Admissions Coordinator [phone] 01/05/2021 12:02 PM  Insurance has denied CIR admit due to no medical neccesity for acute inpt rehab , I contacted patient and her daughter by phone and they are aware. I have alerted acute team. We sill sign off at this time.  Danne Baxter, RN, MSN Rehab Admissions Coordinator [phone] 01/05/2021 2:12 PM

## 2021-01-05 NOTE — Assessment & Plan Note (Addendum)
-   eliquis resumed; discussed with pharmacist, dose changed to 5 mg BID which is appropriate for her age/weight/renal function - continue amio

## 2021-01-05 NOTE — Assessment & Plan Note (Addendum)
-   Febrile, leukocytosis, urinary source - completed meropenem course inpatient

## 2021-01-05 NOTE — Assessment & Plan Note (Signed)
-   followed by cardiology outpatient

## 2021-01-05 NOTE — Assessment & Plan Note (Addendum)
-   Known problem from previous hospitalization as well - Continue FiberCon and colestid - GI pathogen panel negative; Cdiff consistent with colonization

## 2021-01-05 NOTE — Assessment & Plan Note (Addendum)
-   UCx growing E. Coli ESBL and proteus -Transitioned from Rocephin to meropenem after urine cultures updated - Continue treatment with meropenem for 5 days.  Started on 01/03/2021

## 2021-01-05 NOTE — Procedures (Signed)
I was present at this dialysis session. I have reviewed the session itself and made appropriate changes.   Vital signs in last 24 hours:  Temp:  [97.6 F (36.4 C)-98.9 F (37.2 C)] 98.8 F (37.1 C) (07/19 0700) Pulse Rate:  [69-73] 69 (07/19 0700) Resp:  [18-20] 18 (07/19 0700) BP: (100-121)/(43-52) 118/43 (07/19 0700) SpO2:  [96 %] 96 % (07/19 0700) Weight change:  Filed Weights   01/02/21 1514 01/02/21 1855  Weight: 66.8 kg 65.2 kg    Recent Labs  Lab 01/01/21 0342 01/02/21 0224 01/05/21 0151  NA 134*   < > 132*  K 3.1*   < > 3.3*  CL 95*   < > 98  CO2 32   < > 27  GLUCOSE 83   < > 84  BUN 13   < > 25*  CREATININE 2.93*   < > 4.23*  CALCIUM 7.9*   < > 8.0*  PHOS 2.9  --   --    < > = values in this interval not displayed.    Recent Labs  Lab 12/31/20 1714 01/01/21 0342 01/02/21 0224 01/03/21 1842 01/05/21 0151  WBC 20.3*   < > 17.3* 16.2* 13.2*  NEUTROABS 18.5*  --   --   --   --   HGB 9.2*   < > 9.7* 10.8* 8.8*  HCT 29.0*   < > 30.6* 33.7* 26.9*  MCV 98.0   < > 97.1 97.7 96.1  PLT 267   < > 279 319 262   < > = values in this interval not displayed.    Scheduled Meds:  (feeding supplement) PROSource Plus  30 mL Oral BID BM   amiodarone  200 mg Oral Daily   apixaban  2.5 mg Oral BID   Chlorhexidine Gluconate Cloth  6 each Topical Q0600   feeding supplement (NEPRO CARB STEADY)  237 mL Oral BID BM   hydrocortisone  25 mg Rectal BID   multivitamin  1 tablet Oral QHS   polycarbophil  625 mg Oral BID AC   Continuous Infusions:  meropenem (MERREM) IV 500 mg (01/04/21 1440)   PRN Meds:.acetaminophen, hydrOXYzine, simethicone, witch hazel-glycerin    Dialysis Orders: [facility] TTS 4h  400/500  72kg   2/2 bath P2  Hep none  AVF and TDC, either may be used - mircera 60ug q2 wks, last 7/14   Assessment/Plan: ESBL E.coli + Proteus UTI: On meropenem, per primary. Confusion/weakness appear to have resolved.  ESRD: Continue HD per usual TTS schedule -> today  7/19. Hypertension/volume: Well below prior dry weight, + edema - UF as tolerated.  Likely due to profound hypoalbuminemia and third spacing.   Will continue to UF as bp tolerates and challenge edw. Anemia of ESRD: Hgb 10.8 - not due for ESA yet.    Metabolic bone disease: Ca/Phos ok. No binders or VDRA at this time - follow.  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 Chronic combined CHF -- volume removal with UF on HD Paroxysmal A-Fib: On Eliquis/amiodarone. Severe aortic stenosis - f/b cardiology Nutrition: Alb very low, continue supplements. Diarrhea - likely due to antibiotics but should check for C. Diff.  Donetta Potts,  MD 01/05/2021, 8:32 AM

## 2021-01-05 NOTE — Assessment & Plan Note (Addendum)
-   baseline Hgb 9-10 g/dL

## 2021-01-05 NOTE — Progress Notes (Signed)
Progress Note    [name]   [facility]:[id number]  DOB: March 06, 1946  DOA: 12/31/2020     4  PCP: Lajean Manes, MD  CC: Confusion, weakness  Hospital Course: Ms. Tackitt is a 75 yo female with PMH ESRD on HD, severe AS, HFpEF, PAF on Eliquis who presented to the ER after she developed weakness, N/V.  On workup she was found to have a fever, leukocytosis, and urine specimen notable for elevated WBCs and bacteria.  There was also noted confusion from the patient on admission.  She was initially started on Rocephin and urine cultures speciated to E. coli ESBL and Proteus.  She was transitioned to meropenem. There was also some report of blood mixed in her stool.  Hemoglobin was stable on admission.  FOBT was positive.  Due to stability of hemoglobin, decision was made for monitoring over inpatient work-up.  Interval History:  No events overnight.  Confusion appears to have improved since admission.  She is planning for rehab of some sort due to her weakness.  ROS: Constitutional: negative for chills and fevers, Respiratory: negative for cough, Cardiovascular: negative for chest pain, and Gastrointestinal: negative for abdominal pain  Assessment & Plan: UTI (urinary tract infection) - UCx growing E. Coli ESBL and proteus -Transitioned from Rocephin to meropenem after urine cultures updated - Continue treatment with meropenem for 3 to 5 days vs 3 days and fosfomycin if approaching discharge.  Started on 01/03/2021  ESRD on dialysis [facility]) - Continue dialysis per nephrology  Sepsis (HCC)-resolved as of 01/05/2021 - Febrile, leukocytosis, urinary source - Continue meropenem, see UTI  Physical deconditioning - Multifactorial in setting of underlying UTI as well as overall deconditioning from recent hospitalization in June 2022 (discharged from rehab on 6/15 after almost 30 day stay) - PT/OT following - dispo initially CIR but denied by insurance; will likely start pursuing SNF   Chronic  diarrhea - Known problem from previous hospitalization as well - Continue FiberCon - May also use Imodium as needed - check GI pathogen panel as well   Atrial fibrillation (HCC) - Continue Eliquis and amiodarone - History of hemorrhoids with some reports of scant bloody stool on admission.  Hemoglobin stable.  FOBT was positive - Continue trending hemoglobin  Aortic stenosis - followed by cardiology outpatient   Anemia in chronic kidney disease - baseline Hgb 9-10 g/dL - currently 8.8 g/dL  Acute metabolic encephalopathy-resolved as of 01/05/2021 - patient symptoms include AMS - etiology considered due to metabolic from sepsis/UTI - now back to baseline     Old records reviewed in assessment of this patient  Antimicrobials: Rocephin 12/31/2020 >> 01/03/2021 Meropenem 01/03/2021 >> current  DVT prophylaxis: apixaban (ELIQUIS) tablet 2.5 mg Start: 01/01/21 1000 SCDs Start: 12/31/20 2058 apixaban (ELIQUIS) tablet 2.5 mg   Code Status:   Code Status: Full Code Family Communication:   Disposition Plan: Status is: Inpatient  Remains inpatient appropriate because:Unsafe d/c plan and Inpatient level of care appropriate due to severity of illness  Dispo:  Patient From: Home  Planned Disposition: Inpatient Rehab  Medically stable for discharge: No     Risk of unplanned readmission score: Unplanned Admission- Pilot do not use: 33.2   Objective: Blood pressure (!) 114/45, pulse 76, temperature 98.2 F (36.8 C), temperature source Oral, resp. rate 18, height 5' 2.99" (1.6 m), weight 66.6 kg, last menstrual period 01/19/1992, SpO2 95 %.  Examination: General appearance: alert, cooperative, and no distress Head: Normocephalic, without obvious abnormality, atraumatic Eyes:  EOMI  Lungs: clear to auscultation bilaterally Heart: regular rate and rhythm and S1, S2 normal Abdomen: normal findings: bowel sounds normal and soft, non-tender Extremities:  No edema Skin: mobility and  turgor normal Neurologic: Grossly normal  Consultants:    Procedures:    Data Reviewed: I have personally reviewed following labs and imaging studies Results for orders placed or performed during the hospital encounter of 12/31/20 (from the past 24 hour(s))  Basic metabolic panel     Status: Abnormal   Collection Time: 01/05/21  1:51 AM  Result Value Ref Range   Sodium 132 (L) 135 - 145 mmol/L   Potassium 3.3 (L) 3.5 - 5.1 mmol/L   Chloride 98 98 - 111 mmol/L   CO2 27 22 - 32 mmol/L   Glucose, Bld 84 70 - 99 mg/dL   BUN 25 (H) 8 - 23 mg/dL   Creatinine, Ser 4.23 (H) 0.44 - 1.00 mg/dL   Calcium 8.0 (L) 8.9 - 10.3 mg/dL   GFR, Estimated 10 (L) >60 mL/min   Anion gap 7 5 - 15  CBC     Status: Abnormal   Collection Time: 01/05/21  1:51 AM  Result Value Ref Range   WBC 13.2 (H) 4.0 - 10.5 K/uL   RBC 2.80 (L) 3.87 - 5.11 MIL/uL   Hemoglobin 8.8 (L) 12.0 - 15.0 g/dL   HCT 26.9 (L) 36.0 - 46.0 %   MCV 96.1 80.0 - 100.0 fL   MCH 31.4 26.0 - 34.0 pg   MCHC 32.7 30.0 - 36.0 g/dL   RDW 16.7 (H) 11.5 - 15.5 %   Platelets 262 150 - 400 K/uL   nRBC 0.0 0.0 - 0.2 %  Hepatitis B surface antigen     Status: None   Collection Time: 01/05/21  8:07 AM  Result Value Ref Range   Hepatitis B Surface Ag NON REACTIVE NON REACTIVE    Recent Results (from the past 240 hour(s))  Urine Culture     Status: Abnormal   Collection Time: 12/31/20  5:14 PM   Specimen: Urine, Clean Catch  Result Value Ref Range Status   Specimen Description URINE, CLEAN CATCH  Final   Special Requests   Final    NONE Performed at [facility], [address], [place], [place] [address]    Culture (A)  Final    >=100,000 COLONIES/mL ESCHERICHIA COLI Confirmed Extended Spectrum Beta-Lactamase Producer (ESBL).  In bloodstream infections from ESBL organisms, carbapenems are preferred over piperacillin/tazobactam. They are shown to have a lower risk of mortality. 80,000 COLONIES/mL PROTEUS MIRABILIS    Report  Status 01/03/2021 FINAL  Final   Organism ID, Bacteria ESCHERICHIA COLI (A)  Final   Organism ID, Bacteria PROTEUS MIRABILIS (A)  Final      Susceptibility   Escherichia coli - MIC*    AMPICILLIN >=32 RESISTANT Resistant     CEFAZOLIN >=64 RESISTANT Resistant     CEFEPIME >=32 RESISTANT Resistant     CEFTRIAXONE >=64 RESISTANT Resistant     CIPROFLOXACIN >=4 RESISTANT Resistant     GENTAMICIN >=16 RESISTANT Resistant     IMIPENEM <=0.25 SENSITIVE Sensitive     NITROFURANTOIN 128 RESISTANT Resistant     TRIMETH/SULFA <=20 SENSITIVE Sensitive     AMPICILLIN/SULBACTAM >=32 RESISTANT Resistant     PIP/TAZO 16 SENSITIVE Sensitive     * >=100,000 COLONIES/mL ESCHERICHIA COLI   Proteus mirabilis - MIC*    AMPICILLIN <=2 SENSITIVE Sensitive     CEFAZOLIN <=4 SENSITIVE Sensitive  CEFEPIME <=0.12 SENSITIVE Sensitive     CEFTRIAXONE <=0.25 SENSITIVE Sensitive     CIPROFLOXACIN <=0.25 SENSITIVE Sensitive     GENTAMICIN <=1 SENSITIVE Sensitive     IMIPENEM 2 SENSITIVE Sensitive     NITROFURANTOIN RESISTANT Resistant     TRIMETH/SULFA <=20 SENSITIVE Sensitive     AMPICILLIN/SULBACTAM <=2 SENSITIVE Sensitive     PIP/TAZO <=4 SENSITIVE Sensitive     * 80,000 COLONIES/mL PROTEUS MIRABILIS  Blood Culture (routine x 2)     Status: None   Collection Time: 12/31/20  6:09 PM   Specimen: Right Antecubital; Blood  Result Value Ref Range Status   Specimen Description RIGHT ANTECUBITAL  Final   Special Requests   Final    BOTTLES DRAWN AEROBIC AND ANAEROBIC Blood Culture adequate volume   Culture   Final    NO GROWTH 5 DAYS Performed at [facility], [place] [address] [place], [place] [address]    Report Status 01/05/2021 FINAL  Final  Resp Panel by RT-PCR (Flu A&B, Covid) Nasopharyngeal Swab     Status: None   Collection Time: 12/31/20  6:16 PM   Specimen: Nasopharyngeal Swab; Nasopharyngeal(NP) swabs in vial transport medium  Result Value Ref Range Status   SARS Coronavirus 2 by RT PCR  NEGATIVE NEGATIVE Final    Comment: (NOTE) SARS-CoV-2 target nucleic acids are NOT DETECTED.  The SARS-CoV-2 RNA is generally detectable in upper respiratory specimens during the acute phase of infection. The lowest concentration of SARS-CoV-2 viral copies this assay can detect is 138 copies/mL. A negative result does not preclude SARS-Cov-2 infection and should not be used as the sole basis for treatment or other patient management decisions. A negative result may occur with  improper specimen collection/handling, submission of specimen other than nasopharyngeal swab, presence of viral mutation(s) within the areas targeted by this assay, and inadequate number of viral copies(<138 copies/mL). A negative result must be combined with clinical observations, patient history, and epidemiological information. The expected result is Negative.  Fact Sheet for Patients:  EntrepreneurPulse.com.au  Fact Sheet for Healthcare Providers:  IncredibleEmployment.be  This test is no t yet approved or cleared by the [place] FDA and  has been authorized for detection and/or diagnosis of SARS-CoV-2 by FDA under an Emergency Use Authorization (EUA). This EUA will remain  in effect (meaning this test can be used) for the duration of the COVID-19 declaration under Section 564(b)(1) of the Act, 21 U.S.C.section 360bbb-3(b)(1), unless the authorization is terminated  or revoked sooner.       Influenza A by PCR NEGATIVE NEGATIVE Final   Influenza B by PCR NEGATIVE NEGATIVE Final    Comment: (NOTE) The Xpert Xpress SARS-CoV-2/FLU/RSV plus assay is intended as an aid in the diagnosis of influenza from Nasopharyngeal swab specimens and should not be used as a sole basis for treatment. Nasal washings and aspirates are unacceptable for Xpert Xpress SARS-CoV-2/FLU/RSV testing.  Fact Sheet for Patients: EntrepreneurPulse.com.au  Fact Sheet for  Healthcare Providers: IncredibleEmployment.be  This test is not yet approved or cleared by the [place] FDA and has been authorized for detection and/or diagnosis of SARS-CoV-2 by FDA under an Emergency Use Authorization (EUA). This EUA will remain in effect (meaning this test can be used) for the duration of the COVID-19 declaration under Section 564(b)(1) of the Act, 21 U.S.C. section 360bbb-3(b)(1), unless the authorization is terminated or revoked.  Performed at [facility], [place] [address], [place], [place] [address]   Blood Culture (  routine x 2)     Status: None   Collection Time: 12/31/20  6:50 PM   Specimen: BLOOD LEFT FOREARM  Result Value Ref Range Status   Specimen Description BLOOD LEFT FOREARM  Final   Special Requests   Final    BOTTLES DRAWN AEROBIC AND ANAEROBIC Blood Culture results may not be optimal due to an inadequate volume of blood received in culture bottles   Culture   Final    NO GROWTH 5 DAYS Performed at [facility], [place] [address], [place], [place] [address]    Report Status 01/05/2021 FINAL  Final     Radiology Studies: No results found. CT Head Wo Contrast  Final Result    DG Chest Port 1 View  Final Result      Scheduled Meds:  (feeding supplement) PROSource Plus  30 mL Oral BID BM   amiodarone  200 mg Oral Daily   apixaban  2.5 mg Oral BID   Chlorhexidine Gluconate Cloth  6 each Topical Q0600   feeding supplement (NEPRO CARB STEADY)  237 mL Oral BID BM   heparin sodium (porcine)       hydrocortisone  25 mg Rectal BID   multivitamin  1 tablet Oral QHS   polycarbophil  625 mg Oral BID AC   PRN Meds: acetaminophen, hydrOXYzine, simethicone, witch hazel-glycerin Continuous Infusions:  meropenem (MERREM) IV 500 mg (01/05/21 1503)     LOS: 4 days  Time spent: Greater than 50% of the 35 minute visit was spent in counseling/coordination of care for the patient as laid out in the A&P.   Dwyane Dee,  MD [facility] 01/05/2021, 4:47 PM

## 2021-01-05 NOTE — Assessment & Plan Note (Addendum)
-   Multifactorial in setting of underlying UTI as well as overall deconditioning from recent hospitalization in June 2022 (discharged from rehab on 6/15 after almost 30 day stay) - PT/OT following - dispo initially CIR but denied by insurance; patient declining SNF and desires d/c home when time

## 2021-01-05 NOTE — Progress Notes (Signed)
Pt transported to dialysis; telebox with pt; dialysis RN aware 

## 2021-01-06 DIAGNOSIS — K921 Melena: Secondary | ICD-10-CM

## 2021-01-06 DIAGNOSIS — I480 Paroxysmal atrial fibrillation: Secondary | ICD-10-CM | POA: Diagnosis not present

## 2021-01-06 DIAGNOSIS — G9341 Metabolic encephalopathy: Secondary | ICD-10-CM | POA: Diagnosis not present

## 2021-01-06 LAB — CBC WITH DIFFERENTIAL/PLATELET
Abs Immature Granulocytes: 0.11 10*3/uL — ABNORMAL HIGH (ref 0.00–0.07)
Basophils Absolute: 0.1 10*3/uL (ref 0.0–0.1)
Basophils Relative: 1 %
Eosinophils Absolute: 0.3 10*3/uL (ref 0.0–0.5)
Eosinophils Relative: 3 %
HCT: 27.5 % — ABNORMAL LOW (ref 36.0–46.0)
Hemoglobin: 9 g/dL — ABNORMAL LOW (ref 12.0–15.0)
Immature Granulocytes: 1 %
Lymphocytes Relative: 8 %
Lymphs Abs: 0.7 10*3/uL (ref 0.7–4.0)
MCH: 31.8 pg (ref 26.0–34.0)
MCHC: 32.7 g/dL (ref 30.0–36.0)
MCV: 97.2 fL (ref 80.0–100.0)
Monocytes Absolute: 0.6 10*3/uL (ref 0.1–1.0)
Monocytes Relative: 7 %
Neutro Abs: 7.1 10*3/uL (ref 1.7–7.7)
Neutrophils Relative %: 80 %
Platelets: 244 10*3/uL (ref 150–400)
RBC: 2.83 MIL/uL — ABNORMAL LOW (ref 3.87–5.11)
RDW: 17.2 % — ABNORMAL HIGH (ref 11.5–15.5)
WBC: 8.8 10*3/uL (ref 4.0–10.5)
nRBC: 0 % (ref 0.0–0.2)

## 2021-01-06 LAB — BASIC METABOLIC PANEL
Anion gap: 6 (ref 5–15)
BUN: 15 mg/dL (ref 8–23)
CO2: 29 mmol/L (ref 22–32)
Calcium: 7.6 mg/dL — ABNORMAL LOW (ref 8.9–10.3)
Chloride: 100 mmol/L (ref 98–111)
Creatinine, Ser: 3.17 mg/dL — ABNORMAL HIGH (ref 0.44–1.00)
GFR, Estimated: 15 mL/min — ABNORMAL LOW (ref >60)
Glucose, Bld: 107 mg/dL — ABNORMAL HIGH (ref 70–99)
Potassium: 3.6 mmol/L (ref 3.5–5.1)
Sodium: 135 mmol/L (ref 135–145)

## 2021-01-06 LAB — MAGNESIUM: Magnesium: 1.8 mg/dL (ref 1.7–2.4)

## 2021-01-06 LAB — PHOSPHORUS: Phosphorus: 2 mg/dL — ABNORMAL LOW (ref 2.5–4.6)

## 2021-01-06 MED ORDER — K PHOS MONO-SOD PHOS DI & MONO 155-852-130 MG PO TABS
500.0000 mg | ORAL_TABLET | Freq: Once | ORAL | Status: AC
  Administered 2021-01-06: 500 mg via ORAL
  Filled 2021-01-06: qty 2

## 2021-01-06 NOTE — Progress Notes (Signed)
Pharmacy Antibiotic Note  [name] is a 75 y.o. female admitted on 12/31/2020 with generalized weakness, found to have Proteus mirabilis and ESBL E.coli UTI.  Pharmacy has been consulted for meropenem dosing.  Patient has ESRD on TTS HD and tolerating.  Afebrile, WBC normalized.  Plan: Merrem 500mg  IV Q24H Pharmacy will sign off as patient is on dialysis and no dose adjustment needed  Height: 5' 2.99" (160 cm) Weight: 66.6 kg (146 lb 13.2 oz) IBW/kg (Calculated) : 52.38  Temp (24hrs), Avg:97.8 F (36.6 C), Min:97 F (36.1 C), Max:98.2 F (36.8 C)  Recent Labs  Lab 12/31/20 1714 12/31/20 2318 01/01/21 0342 01/02/21 0224 01/03/21 1842 01/05/21 0151 01/06/21 0212  WBC 20.3*  --  13.0* 17.3* 16.2* 13.2* 8.8  CREATININE 2.55*  --  2.93* 3.89*  --  4.23* 3.17*  LATICACIDVEN 1.6 0.9  --   --   --   --   --      Estimated Creatinine Clearance: 13.8 mL/min (A) (by C-G formula based on SCr of 3.17 mg/dL (H)).    Allergies  Allergen Reactions   Astemizole Nausea And Vomiting   Fluorouracil Rash   Vicodin [Hydrocodone-Acetaminophen] Nausea And Vomiting   Chlorhexidine Rash    Sunburn    rash   Percocet [Oxycodone-Acetaminophen] Nausea And Vomiting    CTX 7/14 >> 7/17 Merrem 7/17 >>   7/14 BCx - negative 7/14 UCx - ESBL E.coli and P.mirabilis(S to all except Macrobid) GI PCR -  Dmarius Reeder D. Mina Marble, PharmD, BCPS, [place] 01/06/2021, 10:00 AM

## 2021-01-06 NOTE — Progress Notes (Signed)
Occupational Therapy Treatment Patient Details Name: [name] MRN: [id number] DOB: Nov 15, 1945 Today's Date: 01/06/2021    History of present illness The pt is a 75 y.o. female  presenting 7/14 with c/o weakness, lethargy, and delirium following HD session earlier in the day. Upon workup, pt found to have UTI. PMH includes: recent admission for LUE hematoma with concern for compartment syndrome, arthritis, cardiomyopathy, CHF, ESRD, GERD, HLD, HTN, MI, a fib, L THA and L TKA.   OT comments  Pt making progress with functional goal. Pt requires encouragement to initiate activities as he in anxious with self limiting behavior. Session focused on bed mobility to roll for hygiene from BM. Donned brief on pt due to incontinence in prep for mobility. Pt sat EOB, sit - stand with RW, functional mobility with RW, transfer to recliner for simulated LB bathing, LB dressing, B UE exercises (chair push ups). Pt's daughter present and very supportive. OT will continue to follow acutely to maximize level of function and safety  Follow Up Recommendations  SNF (CIR denied, pt refusing SNF, will need max HH therapies)    Equipment Recommendations  None recommended by OT    Recommendations for Other Services      Precautions / Restrictions Precautions Precautions: Fall Precaution Comments: Incontinent-needs brief on during session due to bowel and bladder incontinence. Restrictions Weight Bearing Restrictions: No       Mobility Bed Mobility Overal bed mobility: Needs Assistance Bed Mobility: Rolling;Sidelying to Sit Rolling: Mod assist;+2 for safety/equipment Sidelying to sit: Mod assist;+2 for safety/equipment       General bed mobility comments: Pt performed rolling to R and L side for pericare and new pad placement.  Pt required assistance for trunk elevation and increased time with assistance to scoot to the edge of the bed.    Transfers Overall transfer level: Needs  assistance Equipment used: Rolling walker (2 wheeled) Transfers: Sit to/from Stand Sit to Stand: Mod assist;+2 physical assistance         General transfer comment: Cues for hand placement to and from seated surface.  Pt with improved standing tolerance given it's a non-dialysis day.    Balance Overall balance assessment: Needs assistance Sitting-balance support: Bilateral upper extremity supported;Feet supported Sitting balance-Leahy Scale: Poor     Standing balance support: Bilateral upper extremity supported;During functional activity Standing balance-Leahy Scale: Poor                             ADL either performed or assessed with clinical judgement   ADL                                               Vision Baseline Vision/History: Wears glasses Wears Glasses: Reading only Patient Visual Report: No change from baseline     Perception     Praxis      Cognition Arousal/Alertness: Awake/alert Behavior During Therapy: Anxious Overall Cognitive Status: Within Functional Limits for tasks assessed Area of Impairment: Safety/judgement;Problem solving                         Safety/Judgement: Decreased awareness of deficits   Problem Solving: Decreased initiation;Requires verbal cues General Comments: Pt continues to self limit but with encouragement she was agreeable to attempt standing and walking with RW  Exercises     Shoulder Instructions       General Comments      Pertinent Vitals/ Pain       Pain Assessment: Faces Faces Pain Scale: Hurts little more Pain Location: generalized Pain Descriptors / Indicators: Discomfort Pain Intervention(s): Monitored during session;Repositioned  Home Living                                          Prior Functioning/Environment              Frequency  Min 2X/week        Progress Toward Goals  OT Goals(current goals can now be found in  the care plan section)  Progress towards OT goals: Progressing toward goals  Acute Rehab OT Goals Patient Stated Goal: return home  Plan Discharge plan remains appropriate    Co-evaluation    PT/OT/SLP Co-Evaluation/Treatment: Yes Reason for Co-Treatment: Complexity of the patient's impairments (multi-system involvement);For patient/therapist safety;To address functional/ADL transfers PT goals addressed during session: Mobility/safety with mobility OT goals addressed during session: ADL's and self-care;Proper use of Adaptive equipment and DME      AM-PAC OT "6 Clicks" Daily Activity     Outcome Measure   Help from another person eating meals?: None Help from another person taking care of personal grooming?: A Little Help from another person toileting, which includes using toliet, bedpan, or urinal?: Total Help from another person bathing (including washing, rinsing, drying)?: A Lot Help from another person to put on and taking off regular upper body clothing?: None Help from another person to put on and taking off regular lower body clothing?: A Lot 6 Click Score: 16    End of Session Equipment Utilized During Treatment: Gait belt;Rolling walker  OT Visit Diagnosis: Other abnormalities of gait and mobility (R26.89);Muscle weakness (generalized) (M62.81);Pain Pain - part of body:  (generalized)   Activity Tolerance Patient tolerated treatment well   Patient Left with call bell/phone within reach;in chair;with chair alarm set;with family/visitor present   Nurse Communication          Time: 0998-3382 OT Time Calculation (min): 26 min  Charges: OT General Charges $OT Visit: 1 Visit OT Treatments $Self Care/Home Management : 8-22 mins    Britt Bottom 01/06/2021, 3:20 PM

## 2021-01-06 NOTE — Care Management Important Message (Signed)
Important Message  Patient Details  Name: [name] MRN: [id number] Date of Birth: 01/09/1946   Medicare Important Message Given:  Yes     Orbie Pyo 01/06/2021, 3:25 PM

## 2021-01-06 NOTE — Consult Note (Signed)
Referring Provider: Dr. Sabino Gasser Primary Care Physician:  Lajean Manes, MD Primary Gastroenterologist:  [facility] (prior patient of Dr. Wynetta Emery)  Reason for Consultation:  Diarrhea, rectal bleeding  HPI: [name] is a 75 y.o. female with past medical history of ESRD on HD, severe AS, CHF, A fib (on Eliquis), and chronic diarrhea, currently admitted for UTI with E. Coli sepsis (resolved) presenting for consultation of diarrhea and rectal bleeding.  Patient reports history of intermittent scant red blood per rectum.  She states her daughter noticed today at home within the last couple weeks.  She states that nursing staff noticed some yesterday.  She denies passing large amounts of blood.  She denies any melena.  She reports lower abdominal pain, presumably due to UTI, which has resolved over the last couple of days.  Denies any nausea, vomiting, changes in appetite.  Reports weight loss of 20 to 30 pounds within the last 2 months.  Family history pertinent for brother with colon cancer, diagnosed in his 17s.  Patient is on Eliquis.  Denies aspirin or NSAID use.  EGD 08/2018: No bleeding or source of heme positivity seen on this exam. Non-obstructing moderate Schatzki ring. Large hiatal hernia, with angulation of the pouch making for a difficult exam of the distal stomach.  Colonoscopy 09/2016: one polyp in cecum - pathology showed only foreign vegetable material.   Colonoscopy 02/2010: diverticulosis, otherwise normal  Past Medical History:  Diagnosis Date   Arthritis of left knee    Cardiomyopathy ([place])    a. h/o LV dysfunction EF 20-25% in 2013 due to sepsis.>> improved to normal    Chronic diastolic CHF (congestive heart failure) ([place])    10/ 2013 in setting of septic shock   Complication of anesthesia    use a little anesthesia , per patient MD states she quit breathing (2016); hard to wake up   ESRD (end stage renal disease) ([place])    dialysis Tues, Thurs, Sat [address],  sees dr deterding    GERD (gastroesophageal reflux disease)    Glaucoma    both eyes   H/O hiatal hernia    a. CT 2017: large gastric hiatal hernia.   Headache(784.0)    migraine hx of   History of blood transfusion 04/13/2015    History of echocardiogram    a. Echo 6/17: EF 60-65%, normal wall motion, mild MR, atrial septal lipomatous hypertrophy, PASP 34 mmHg, possible trivial free-flowing pericardial effusion along RV free wall // b. Echo 5/17: Mild LVH, EF 55-60%, normal wall motion, grade 1 diastolic dysfunction, trivial MR, severe LAE, mild RAE, PASP 42 mmHg   History of kidney stones    10/18/2019: per patient "has a couple currently one in each kidney"   History of nephrostomy 04/11/2015   currently inplace 04/28/2015  removed now   History of nuclear stress test    a. Myoview 1/14 - Marked ischemia in the basal anterior, mid anterior, apical septal and apical inferior regions, EF 63% >> LHC normal    Hyperlipidemia    Hypertension    medication removed from regimen due to low blood pressure    Iron deficiency    hx   Myocardial infarction [facility]) 2013   10/18/2019: per patient "in 2013)   Nephrolithiasis 2002, 2006   bilateral   Normal coronary arteries 2014   a. LHC in 1/14: normal coronary arteries   PAF (paroxysmal atrial fibrillation) ([place])    a. 10/ 2013  in setting of Septic Shock //  b. recurrent during admit for pneumonia, L effusion >> placed on Amiodarone // Coumadin for anticoagulation   Pneumonia jan 2018, last tme lungs clear now   dx 10-06-2014 per CXR--  on 10-27-2014 pt states finished antibiotic and denies cough or fever   Primary localized osteoarthritis of right hip 10/22/2019   S/P hemodialysis catheter insertion ([place]) 04/11/2015    right anterior chest , only used once    Sigmoid diverticulosis    SOB (shortness of breath) on exertion    10/18/2019: per patient "get short of breath with activity sometimes due to heart valve"   UTI (urinary tract infection)  05/10/2016    Past Surgical History:  Procedure Laterality Date   AV FISTULA PLACEMENT Left 06/02/2015   Procedure: BRACHIOCEPHALIC ARTERIOVENOUS (AV) FISTULA CREATION ;  Surgeon: Conrad [place], MD;  Location: [place];  Service: Vascular;  Laterality: Left;   [place] Left 07/27/2015   Procedure: FIRST STAGE BASILIC VEIN TRANSPOSITION LEFT UPPER ARM;  Surgeon: Conrad [place], MD;  Location: [place];  Service: Vascular;  Laterality: Left;   [place] Left 09/2015   second phase   [place] Left 10/12/2015   Procedure: SECOND STAGE BASILIC VEIN TRANSPOSITION LEFT ARM;  Surgeon: Conrad [place], MD;  Location: [place];  Service: Vascular;  Laterality: Left;   BREAST BIOPSY Left 08/23/07   benign fibrocystic with duct ectasia   CARDIAC CATHETERIZATION  07-11-2012  dr Irish Lack   Abnormal stress test/   normal coronary arteries/  LVEDP  82mmHg   CARDIOVASCULAR STRESS TEST  06-26-2012  dr Irish Lack   marked ischemia in the basal anterior, mid anterior, apical inferior regions/  normal LVF, ef 63%   CATARACT EXTRACTION W/ INTRAOCULAR LENS  IMPLANT, BILATERAL     COLONOSCOPY WITH PROPOFOL N/A 10/17/2016   Procedure: COLONOSCOPY WITH PROPOFOL;  Surgeon: Garlan Fair, MD;  Location: [facility];  Service: Endoscopy;  Laterality: N/A;   CYSTOSCOPY W/ URETERAL STENT PLACEMENT  04/04/2012   Procedure: CYSTOSCOPY WITH RETROGRADE PYELOGRAM/URETERAL STENT PLACEMENT;  Surgeon: Ailene Rud, MD;  Location: [place];  Service: Urology;  Laterality: Left;   CYSTOSCOPY W/ URETERAL STENT PLACEMENT Bilateral 05/04/2015   Procedure: CYSTOSCOPY WITH BILATERAL RETROGRADE PYELOGRAM/ WITH INTERPRETATION, EXCHANGE OF RIGHT URETERAL STENT REPLACEMENT AND PLACEMENT LEFT URETERAL STENT PLACEMENT EXAMINATION OF VAGINA;  Surgeon: Carolan Clines, MD;  Location: [facility];  Service: Urology;  Laterality: Bilateral;   CYSTOSCOPY WITH STENT PLACEMENT Right 10/28/2014   Procedure:  RIGHT URETERAL STENT PLACEMENT;  Surgeon: Irine Seal, MD;  Location[facility];  Service: Urology;  Laterality: Right;   CYSTOSCOPY WITH STENT PLACEMENT Right 02/26/2015   Procedure: CYSTOSCOPY RETROGRADE PYELOGRAM WITH STENT PLACEMENT;  Surgeon: Cleon Gustin, MD;  Location: [facility];  Service: Urology;  Laterality: Right;   CYSTOSCOPY/RETROGRADE/URETEROSCOPY/STONE EXTRACTION WITH BASKET Right 11/21/2014   Procedure: CYSTOSCOPY/RIGHT RETROGRADE PYELOGRAM/RIGHT URETEROSCOPY/BASKET EXTRACTION/RIGHT PYELOSCOPY/LASER OF STONE/RIGHT DOUBLE J STENT;  Surgeon: Carolan Clines, MD;  Location: [place];  Service: Urology;  Laterality: Right;   ESOPHAGOGASTRODUODENOSCOPY (EGD) WITH PROPOFOL Left 08/25/2018   Procedure: ESOPHAGOGASTRODUODENOSCOPY (EGD) WITH PROPOFOL;  Surgeon: Ronald Lobo, MD;  Location: [place];  Service: Endoscopy;  Laterality: Left;   EXTRACORPOREAL SHOCK WAVE LITHOTRIPSY  05-28-2012  &  10-08-2012   HEMATOMA EVACUATION Left 10/27/2020   Procedure: EVACUATION HEMATOMA, repair of Pseudoaneurysm;  Surgeon: Waynetta Sandy, MD;  Location: [place];  Service: Vascular;  Laterality: Left;   HIP ARTHROPLASTY Left  06/09/2020   Procedure: ARTHROPLASTY BIPOLAR HIP (HEMIARTHROPLASTY);  Surgeon: Marchia Bond, MD;  Location: [place];  Service: Orthopedics;  Laterality: Left;   HOLMIUM LASER APPLICATION Right 09/21/100   Procedure: HOLMIUM LASER APPLICATION;  Surgeon: Carolan Clines, MD;  [facility];  Service: Urology;  Laterality: Right;   IR GENERIC HISTORICAL  02/01/2016   IR NEPHROSTOMY EXCHANGE RIGHT 02/01/2016 Greggory Keen, MD [facility]   IR GENERIC HISTORICAL  02/24/2016   IR PATIENT EVAL TECH 0-60 MINS 02/24/2016 Aletta Edouard, MD [facility]   KNEE ARTHROSCOPY Left 02-14-2003   LAPAROSCOPIC CHOLECYSTECTOMY  03-23-2005   TOTAL ABDOMINAL HYSTERECTOMY W/ BILATERAL SALPINGOOPHORECTOMY  1993   secondary to fibroids    TOTAL KNEE ARTHROPLASTY Left 10/22/2019   TOTAL KNEE ARTHROPLASTY Left 10/22/2019   Procedure: TOTAL KNEE ARTHROPLASTY;  Surgeon: Marchia Bond, MD;  Location: [place];  Service: Orthopedics;  Laterality: Left;   TRANSTHORACIC ECHOCARDIOGRAM  04-09-2012   normal LVF,  ef 60-65%,  mild LAE,  mild TR, trivial MR and PR    Prior to Admission medications   Medication Sig Start Date End Date Taking? Authorizing Provider  amiodarone (PACERONE) 200 MG tablet Take 1 tablet (200 mg total) by mouth daily. 08/24/20  Yes Jettie Booze, MD  apixaban (ELIQUIS) 2.5 MG TABS tablet Take 1 tablet (2.5 mg total) by mouth 2 (two) times daily. 09/14/20  Yes Jettie Booze, MD  cephALEXin (KEFLEX) 250 MG capsule Take 250 mg by mouth daily. 05/24/20  Yes [provider]  colestipol (COLESTID) 5 g packet Take 5 g by mouth 2 (two) times daily.   Yes [provider]  gabapentin (NEURONTIN) 600 MG tablet Take 0.5 tablets (300 mg total) by mouth at bedtime. 12/02/20  Yes Love, Ivan Anchors, PA-C  multivitamin (RENA-VIT) TABS tablet Take 1 tablet by mouth at bedtime. 07/27/19  Yes [provider]  polycarbophil (FIBERCON) 625 MG tablet Take 1 tablet (625 mg total) by mouth 2 (two) times daily before lunch and supper. 12/02/20  Yes Love, Ivan Anchors, PA-C  saccharomyces boulardii (FLORASTOR) 250 MG capsule Take 1 capsule (250 mg total) by mouth 2 (two) times daily. 07/08/15  Yes Donne Hazel, MD  heparin 1000 unit/mL SOLN injection Heparin Sodium (Porcine) 1,000 Units/mL Catheter Lock Arterial 10/24/20 10/23/21  [provider]    Scheduled Meds:  (feeding supplement) PROSource Plus  30 mL Oral BID BM   amiodarone  200 mg Oral Daily   Chlorhexidine Gluconate Cloth  6 each Topical Q0600   feeding supplement (NEPRO CARB STEADY)  237 mL Oral BID BM   hydrocortisone  25 mg Rectal BID   multivitamin  1 tablet Oral QHS   phenazopyridine  200 mg Oral TID WC   polycarbophil  625 mg Oral BID AC    Continuous Infusions:  meropenem (MERREM) IV 500 mg (01/05/21 1503)   PRN Meds:.acetaminophen, hydrOXYzine, simethicone, witch hazel-glycerin  Allergies as of 12/31/2020 - Review Complete 12/11/2020  Allergen Reaction Noted   Astemizole Nausea And Vomiting 05/27/2019   Fluorouracil Rash 05/27/2019   Vicodin [hydrocodone-acetaminophen] Nausea And Vomiting 05/23/2012   Chlorhexidine Rash 06/02/2015   Percocet [oxycodone-acetaminophen] Nausea And Vomiting 05/22/2012    Family History  Problem Relation Age of Onset   Hypertension Mother    Cancer Mother 55       breast   Dementia Mother    Hypertension Brother    Diabetes Brother    Heart disease Brother  before age 32   Cancer Father 27       pancreatic   Heart failure Paternal Grandmother    Bladder Cancer Maternal Grandfather     Social History   Socioeconomic History   Marital status: Widowed    Spouse name: Not on file   Number of children: 1   Years of education: Not on file   Highest education level: Not on file  Occupational History   Occupation: retired   Occupation: Herbalist  Tobacco Use   Smoking status: Never   Smokeless tobacco: Never  Vaping Use   Vaping Use: Never used  Substance and Sexual Activity   Alcohol use: No    Alcohol/week: 0.0 standard drinks   Drug use: No   Sexual activity: Not Currently    Birth control/protection: Post-menopausal, Surgical    Comment: widow husband passed 5/05 with lung cancer  Other Topics Concern   Not on file  Social History Narrative   ** Merged History Encounter **       Social Determinants of Health   Financial Resource Strain: Not on file  Food Insecurity: Not on file  Transportation Needs: Not on file  Physical Activity: Not on file  Stress: Not on file  Social Connections: Not on file  Intimate Partner Violence: Not on file    Review of Systems: Review of Systems  Constitutional:  Positive for weight loss. Negative for chills  and fever.  HENT:  Negative for congestion and sore throat.   Eyes:  Negative for pain and redness.  Respiratory:  Negative for cough and shortness of breath.   Cardiovascular:  Negative for chest pain and palpitations.  Gastrointestinal:  Positive for blood in stool and diarrhea. Negative for abdominal pain, constipation, heartburn, melena, nausea and vomiting.  Genitourinary:  Negative for flank pain and urgency.  Musculoskeletal:  Negative for falls and myalgias.  Skin:  Negative for itching and rash.  Neurological:  Negative for seizures and loss of consciousness.  Psychiatric/Behavioral:  Negative for substance abuse. The patient is not nervous/anxious.     Physical Exam: Vital signs: Vitals:   01/05/21 2040 01/06/21 0755  BP: (!) 110/44 (!) 165/49  Pulse: 77 67  Resp: 16   Temp: (!) 97 F (36.1 C) 98 F (36.7 C)  SpO2: 91% 96%   Last BM Date: 01/05/21  Physical Exam Vitals reviewed.  Constitutional:      General: She is not in acute distress. HENT:     Head: Normocephalic and atraumatic.     Nose: Nose normal. No congestion.     Mouth/Throat:     Mouth: Mucous membranes are moist.     Pharynx: Oropharynx is clear.  Eyes:     General: No scleral icterus.    Extraocular Movements: Extraocular movements intact.  Cardiovascular:     Rate and Rhythm: Normal rate and regular rhythm.  Pulmonary:     Effort: Pulmonary effort is normal. No respiratory distress.  Abdominal:     General: Bowel sounds are normal. There is no distension.     Palpations: Abdomen is soft. There is no mass.     Tenderness: There is no abdominal tenderness. There is no guarding or rebound.     Hernia: No hernia is present.  Musculoskeletal:        General: No swelling or tenderness.     Cervical back: Normal range of motion and neck supple.  Skin:    General: Skin is warm and dry.  Neurological:     General: No focal deficit present.     Mental Status: She is alert and oriented to person,  place, and time.  Psychiatric:        Mood and Affect: Mood normal.        Behavior: Behavior normal. Behavior is cooperative.     GI:  Lab Results: Recent Labs    01/03/21 1842 01/05/21 0151 01/06/21 0212  WBC 16.2* 13.2* 8.8  HGB 10.8* 8.8* 9.0*  HCT 33.7* 26.9* 27.5*  PLT 319 262 244   BMET Recent Labs    01/05/21 0151 01/06/21 0212  NA 132* 135  K 3.3* 3.6  CL 98 100  CO2 27 29  GLUCOSE 84 107*  BUN 25* 15  CREATININE 4.23* 3.17*  CALCIUM 8.0* 7.6*   LFT No results for input(s): PROT, ALBUMIN, AST, ALT, ALKPHOS, BILITOT, BILIDIR, IBILI in the last 72 hours. PT/INR No results for input(s): LABPROT, INR in the last 72 hours.   Studies/Results: No results found.  Impression: Intermittent rectal bleeding -Hgb 9.0, stable  Chronic diarrhea: GI pathogen panel pending  ESRD on HD -BUN 15/ Cr 3.17  Severe AS, CHF, A fib (on Eliquis)  UTI with E. Coli sepsis (resolved)   Plan: Await stool studies.  Continue supportive care and empiric treatment of hemorrhoids.  Discussed with Dr. Michail Sermon - Given stable Hgb, recommend outpatient colonoscopy in the near future.  If weight loss persists, consider imaging to rule out malignancy, though ESRD limits CT study (would be noncontrast).  [facility] will follow at a distance.   LOS: 5 days   Salley Slaughter  PA-C 01/06/2021, 1:34 PM  Contact #  [phone]

## 2021-01-06 NOTE — Plan of Care (Signed)
  Problem: Education: Goal: Knowledge of General Education information will improve Description Including pain rating scale, medication(s)/side effects and non-pharmacologic comfort measures Outcome: Progressing   Problem: Health Behavior/Discharge Planning: Goal: Ability to manage health-related needs will improve Outcome: Progressing   

## 2021-01-06 NOTE — Progress Notes (Signed)
Physical Therapy Treatment Patient Details Name: [name] MRN: [id number] DOB: July 15, 1945 Today's Date: 01/06/2021    History of Present Illness The pt is a 75 y.o. female  presenting 7/14 with c/o weakness, lethargy, and delirium following HD session earlier in the day. Upon workup, pt found to have UTI. PMH includes: recent admission for LUE hematoma with concern for compartment syndrome, arthritis, cardiomyopathy, CHF, ESRD, GERD, HLD, HTN, MI, a fib, L THA and L TKA.    PT Comments    Pt supine in bed.  Pt very nervous about mobility.  She is soiled with bowel incontinence on arrival.  Used brief for OOB activities.  Pt able to progress to short bouts of gt training in room with close chair follow.  Continue to recommend snf placement to improve strength and function.    Follow Up Recommendations  SNF (will require HHPT as patient refusing placement.)     Equipment Recommendations  Other (comment) (drop arm commode.)    Recommendations for Other Services       Precautions / Restrictions Precautions Precautions: Fall Precaution Comments: Incontinent-needs brief on during session due to bowel and bladder incontinence. Restrictions Weight Bearing Restrictions: No    Mobility  Bed Mobility Overal bed mobility: Needs Assistance Bed Mobility: Rolling;Sidelying to Sit Rolling: Mod assist;+2 for safety/equipment Sidelying to sit: Mod assist;+2 for safety/equipment       General bed mobility comments: Pt performed rolling to R and L side for pericare and new pad placement.  Pt required assistance for trunk elevation and increased time with assistance to scoot to the edge of the bed.    Transfers Overall transfer level: Needs assistance Equipment used: Rolling walker (2 wheeled) Transfers: Sit to/from Stand Sit to Stand: Mod assist;+2 physical assistance (3rd person for close chair follow.)         General transfer comment: Cues for hand placement to and from seated  surface.  Pt with improved standing tolerance given it's a non-dialysis day.  Ambulation/Gait Ambulation/Gait assistance: Mod assist;+2 physical assistance;+2 safety/equipment (3rd person for chair follow.) Gait Distance (Feet): 8 Feet (+ 15 ft) Assistive device: Rolling walker (2 wheeled) Gait Pattern/deviations: Step-to pattern;Decreased stride length;Trunk flexed;Narrow base of support;Shuffle     General Gait Details: Cues for upper trunk control and encouragement to progress gt distance.  Close chair follow for safety.   Stairs             Wheelchair Mobility    Modified Rankin (Stroke Patients Only)       Balance Overall balance assessment: Needs assistance Sitting-balance support: Bilateral upper extremity supported;Feet supported Sitting balance-Leahy Scale: Poor       Standing balance-Leahy Scale: Poor                              Cognition Arousal/Alertness: Awake/alert Behavior During Therapy: Anxious Overall Cognitive Status: Within Functional Limits for tasks assessed Area of Impairment: Safety/judgement;Problem solving                         Safety/Judgement: Decreased awareness of deficits   Problem Solving: Decreased initiation;Requires verbal cues General Comments: Pt continues to self limit but with encouragement she was agreeable to attempt standing and walking with RW      Exercises      General Comments        Pertinent Vitals/Pain Pain Assessment: Faces Faces Pain Scale: Hurts little more  Pain Location: generalized Pain Descriptors / Indicators: Discomfort Pain Intervention(s): Monitored during session;Repositioned    Home Living                      Prior Function            PT Goals (current goals can now be found in the care plan section) Acute Rehab PT Goals Patient Stated Goal: return home Potential to Achieve Goals: Fair Progress towards PT goals: Progressing toward goals     Frequency    Min 5X/week      PT Plan Current plan remains appropriate    Co-evaluation PT/OT/SLP Co-Evaluation/Treatment: Yes Reason for Co-Treatment: Complexity of the patient's impairments (multi-system involvement) PT goals addressed during session: Mobility/safety with mobility OT goals addressed during session: ADL's and self-care      AM-PAC PT "6 Clicks" Mobility   Outcome Measure  Help needed turning from your back to your side while in a flat bed without using bedrails?: A Lot Help needed moving from lying on your back to sitting on the side of a flat bed without using bedrails?: A Lot Help needed moving to and from a bed to a chair (including a wheelchair)?: A Lot Help needed standing up from a chair using your arms (e.g., wheelchair or bedside chair)?: A Lot Help needed to walk in hospital room?: A Lot Help needed climbing 3-5 steps with a railing? : Total 6 Click Score: 11    End of Session Equipment Utilized During Treatment: Gait belt Activity Tolerance: Patient tolerated treatment well;Patient limited by fatigue;Patient limited by pain Patient left: with bed alarm set;with family/visitor present;in bed;with call bell/phone within reach Nurse Communication: Mobility status;Need for lift equipment PT Visit Diagnosis: Other abnormalities of gait and mobility (R26.89);Muscle weakness (generalized) (M62.81);Pain     Time: 1115-1131 (OT in room to work on ADLs at conclusion of session.) PT Time Calculation (min) (ACUTE ONLY): 16 min  Charges:  $Gait Training: 8-22 mins                     Erasmo Leventhal , PTA Acute Rehabilitation Services Pager [phone] Office [phone]    Artavius Stearns [name] 01/06/2021, 3:00 PM

## 2021-01-06 NOTE — Assessment & Plan Note (Addendum)
-   per daughter patient has developed mix of reddish stools recently which is abnormal for patient; ongoing chronic diarrhea as well - Hgb remains stable but daughter is requesting inpatient GI evaluation  - FOBT positive on admission - continue trending H/H and follow GI pathogen panel (Negative) - follow up Cdiff: consistent with ongoing colonization but not true infection - Hgb stable and no further mixed stools

## 2021-01-06 NOTE — Progress Notes (Addendum)
[facility] Progress Note   Subjective:  Seen in room. No overnight CP or dyspnea. Ongoing diarrhea, C.diff test pending.   Objective Vitals:   01/05/21 1144 01/05/21 1441 01/05/21 2040 01/06/21 0755  BP: (!) 124/52 (!) 114/45 (!) 110/44 (!) 165/49  Pulse: 63 76 77 67  Resp: 12 18 16    Temp: 98.1 F (36.7 C) 98.2 F (36.8 C) (!) 97 F (36.1 C) 98 F (36.7 C)  TempSrc:  Oral Oral Oral  SpO2:  95% 91% 96%  Weight:      Height:       Physical Exam General: Well appearing woman, NAD. Room air. Heart: RRR; 4/6 systolic murmur Lungs: CTAB Abdomen: soft, non-tender Extremities: 2+ BLE edema Dialysis Access: TDC in R chest, LUE AVF + bruit  Additional Objective Labs: Basic Metabolic Panel: Recent Labs  Lab 01/01/21 0342 01/02/21 0224 01/05/21 0151 01/06/21 0212  NA 134* 131* 132* 135  K 3.1* 4.2 3.3* 3.6  CL 95* 93* 98 100  CO2 32 26 27 29   GLUCOSE 83 79 84 107*  BUN 13 21 25* 15  CREATININE 2.93* 3.89* 4.23* 3.17*  CALCIUM 7.9* 8.2* 8.0* 7.6*  PHOS 2.9  --   --  2.0*   Liver Function Tests: Recent Labs  Lab 12/31/20 1714 01/01/21 0342  AST 13*  --   ALT 10  --   ALKPHOS 87  --   BILITOT 0.5  --   PROT 4.6*  --   ALBUMIN 1.8* 1.6*   CBC: Recent Labs  Lab 12/31/20 1714 01/01/21 0342 01/02/21 0224 01/03/21 1842 01/05/21 0151 01/06/21 0212  WBC 20.3* 13.0* 17.3* 16.2* 13.2* 8.8  NEUTROABS 18.5*  --   --   --   --  7.1  HGB 9.2* 8.1* 9.7* 10.8* 8.8* 9.0*  HCT 29.0* 25.4* 30.6* 33.7* 26.9* 27.5*  MCV 98.0 96.9 97.1 97.7 96.1 97.2  PLT 267 240 279 319 262 244   Medications:  meropenem (MERREM) IV 500 mg (01/05/21 1503)    (feeding supplement) PROSource Plus  30 mL Oral BID BM   amiodarone  200 mg Oral Daily   apixaban  2.5 mg Oral BID   Chlorhexidine Gluconate Cloth  6 each Topical Q0600   feeding supplement (NEPRO CARB STEADY)  237 mL Oral BID BM   hydrocortisone  25 mg Rectal BID   multivitamin  1 tablet Oral QHS    phenazopyridine  200 mg Oral TID WC   phosphorus  500 mg Oral Once   polycarbophil  625 mg Oral BID AC    Dialysis Orders: [facility] TTS 4h  400/500  72kg   2/2 bath P2  Hep none  AVF and TDC, either may be used - Mircera 60ug q2 wks, last 7/14   Assessment/Plan: ESBL E.coli + Proteus UTI: On meropenem, per primary. Confusion/weakness appear to have resolved.  ESRD: Continue HD per usual TTS schedule -> next 7/21. Hypertension/volume: Well below prior dry weight, + edema - UF as tolerated.  Likely due to profound hypoalbuminemia and third spacing.   Will continue to UF as bp tolerates and challenge edw. Anemia of ESRD: Hgb 9 - not due for ESA yet.    Metabolic bone disease: CorrCa ok, Phos low. No binders or VDRA at this time - follow.  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 Chronic combined CHF -- volume removal with UF on HD Paroxysmal  A-Fib: On Eliquis/amiodarone. Severe aortic stenosis - f/b cardiology Nutrition: Alb very low, continue supplements. Diarrhea - likely due to antibiotics, but C.diff test pending.  [name], PA-C 01/06/2021, 10:28 AM  [facility]  I have seen and examined this patient and agree with plan and assessment in the above note with renal recommendations/intervention highlighted.  Broadus John A Onesha Krebbs,MD 01/06/2021 1:58 PM

## 2021-01-06 NOTE — Progress Notes (Signed)
Progress Note    [name]   GDJ:[id number]  DOB: 04/01/1946  DOA: 12/31/2020     5  PCP: Lajean Manes, MD  CC: Confusion, weakness  Hospital Course: [name] is a 75 yo female with PMH ESRD on HD, severe AS, HFpEF, PAF on Eliquis who presented to the ER after she developed weakness, N/V.  On workup she was found to have a fever, leukocytosis, and urine specimen notable for elevated WBCs and bacteria.  There was also noted confusion from the patient on admission.  She was initially started on Rocephin and urine cultures speciated to E. coli ESBL and Proteus.  She was transitioned to meropenem. There was also some report of blood mixed in her stool.  Hemoglobin was stable on admission.  FOBT was positive.  Due to stability of hemoglobin, decision was made for monitoring however after ongoing mixed bloody stools, GI was consulted on 7/20.   Interval History:  Daughter still noting some mixture of bloody stools this morning.  GI consult was requested per daughter as well.  Mentation continues to improve from patient after treating UTI.  ROS: Constitutional: negative for chills and fevers, Respiratory: negative for cough, Cardiovascular: negative for chest pain, and Gastrointestinal: negative for abdominal pain  Assessment & Plan: UTI (urinary tract infection) - UCx growing E. Coli ESBL and proteus -Transitioned from Rocephin to meropenem after urine cultures updated - Continue treatment with meropenem for 3 to 5 days vs 3 days and fosfomycin if approaching discharge.  Started on 01/03/2021  ESRD on dialysis [facility]) - Continue dialysis per nephrology  Sepsis (HCC)-resolved as of 01/05/2021 - Febrile, leukocytosis, urinary source - Continue meropenem, see UTI  Bloody stool - per daughter patient has developed mix of reddish stools recently which is abnormal for patient; ongoing chronic diarrhea as well - Hgb remains stable but daughter is requesting inpatient GI evaluation  -  FOBT positive on admission - continue trending H/H and follow GI pathogen panel - GI following as well now   Physical deconditioning - Multifactorial in setting of underlying UTI as well as overall deconditioning from recent hospitalization in June 2022 (discharged from rehab on 6/15 after almost 30 day stay) - PT/OT following - dispo initially CIR but denied by insurance; patient declining SNF and desires d/c home when time  Chronic diarrhea - Known problem from previous hospitalization as well - Continue FiberCon - May also use Imodium as needed - check GI pathogen panel as well  - GI now following; may need flex sig depending on GI pathogen panel findings   Atrial fibrillation ([place]) - hold Eliquis for now - continue amio - History of hemorrhoids with some reports of scant bloody stool on admission.  Hemoglobin stable.  FOBT was positive - Continue trending hemoglobin  Aortic stenosis - followed by cardiology outpatient   Anemia in chronic kidney disease - baseline Hgb 9-10 g/dL  Acute metabolic encephalopathy-resolved as of 01/05/2021 - patient symptoms include AMS - etiology considered due to metabolic from sepsis/UTI - now back to baseline    Old records reviewed in assessment of this patient  Antimicrobials: Rocephin 12/31/2020 >> 01/03/2021 Meropenem 01/03/2021 >> current  DVT prophylaxis: SCDs Start: 12/31/20 2058   Code Status:   Code Status: Full Code Family Communication:   Disposition Plan: Status is: Inpatient  Remains inpatient appropriate because:Unsafe d/c plan and Inpatient level of care appropriate due to severity of illness  Dispo:  Patient From: Home  Planned Disposition: Home with Health  Care Svc  Medically stable for discharge: No     Risk of unplanned readmission score: Unplanned Admission- Pilot do not use: 32.54   Objective: Blood pressure (!) 168/55, pulse 74, temperature 97.7 F (36.5 C), temperature source Oral, resp. rate 16,  height 5' 2.99" (1.6 m), weight 66.6 kg, last menstrual period 01/19/1992, SpO2 97 %.  Examination: General appearance: alert, cooperative, and no distress Head: Normocephalic, without obvious abnormality, atraumatic Eyes:  EOMI Lungs: clear to auscultation bilaterally Heart: regular rate and rhythm and S1, S2 normal Abdomen: normal findings: bowel sounds normal and soft, non-tender Extremities:  No edema Skin: mobility and turgor normal Neurologic: Grossly normal  Consultants:  GI  Procedures:    Data Reviewed: I have personally reviewed following labs and imaging studies Results for orders placed or performed during the hospital encounter of 12/31/20 (from the past 24 hour(s))  Basic metabolic panel     Status: Abnormal   Collection Time: 01/06/21  2:12 AM  Result Value Ref Range   Sodium 135 135 - 145 mmol/L   Potassium 3.6 3.5 - 5.1 mmol/L   Chloride 100 98 - 111 mmol/L   CO2 29 22 - 32 mmol/L   Glucose, Bld 107 (H) 70 - 99 mg/dL   BUN 15 8 - 23 mg/dL   Creatinine, Ser 3.17 (H) 0.44 - 1.00 mg/dL   Calcium 7.6 (L) 8.9 - 10.3 mg/dL   GFR, Estimated 15 (L) >60 mL/min   Anion gap 6 5 - 15  CBC with Differential/Platelet     Status: Abnormal   Collection Time: 01/06/21  2:12 AM  Result Value Ref Range   WBC 8.8 4.0 - 10.5 K/uL   RBC 2.83 (L) 3.87 - 5.11 MIL/uL   Hemoglobin 9.0 (L) 12.0 - 15.0 g/dL   HCT 27.5 (L) 36.0 - 46.0 %   MCV 97.2 80.0 - 100.0 fL   MCH 31.8 26.0 - 34.0 pg   MCHC 32.7 30.0 - 36.0 g/dL   RDW 17.2 (H) 11.5 - 15.5 %   Platelets 244 150 - 400 K/uL   nRBC 0.0 0.0 - 0.2 %   Neutrophils Relative % 80 %   Neutro Abs 7.1 1.7 - 7.7 K/uL   Lymphocytes Relative 8 %   Lymphs Abs 0.7 0.7 - 4.0 K/uL   Monocytes Relative 7 %   Monocytes Absolute 0.6 0.1 - 1.0 K/uL   Eosinophils Relative 3 %   Eosinophils Absolute 0.3 0.0 - 0.5 K/uL   Basophils Relative 1 %   Basophils Absolute 0.1 0.0 - 0.1 K/uL   Immature Granulocytes 1 %   Abs Immature Granulocytes 0.11  (H) 0.00 - 0.07 K/uL  Magnesium     Status: None   Collection Time: 01/06/21  2:12 AM  Result Value Ref Range   Magnesium 1.8 1.7 - 2.4 mg/dL  Phosphorus     Status: Abnormal   Collection Time: 01/06/21  2:12 AM  Result Value Ref Range   Phosphorus 2.0 (L) 2.5 - 4.6 mg/dL    Recent Results (from the past 240 hour(s))  Urine Culture     Status: Abnormal   Collection Time: 12/31/20  5:14 PM   Specimen: Urine, Clean Catch  Result Value Ref Range Status   Specimen Description URINE, CLEAN CATCH  Final   Special Requests   Final    NONE Performed at [facility], [address], [place], [place] [address]    Culture (A)  Final    >=  100,000 COLONIES/mL ESCHERICHIA COLI Confirmed Extended Spectrum Beta-Lactamase Producer (ESBL).  In bloodstream infections from ESBL organisms, carbapenems are preferred over piperacillin/tazobactam. They are shown to have a lower risk of mortality. 80,000 COLONIES/mL PROTEUS MIRABILIS    Report Status 01/03/2021 FINAL  Final   Organism ID, Bacteria ESCHERICHIA COLI (A)  Final   Organism ID, Bacteria PROTEUS MIRABILIS (A)  Final      Susceptibility   Escherichia coli - MIC*    AMPICILLIN >=32 RESISTANT Resistant     CEFAZOLIN >=64 RESISTANT Resistant     CEFEPIME >=32 RESISTANT Resistant     CEFTRIAXONE >=64 RESISTANT Resistant     CIPROFLOXACIN >=4 RESISTANT Resistant     GENTAMICIN >=16 RESISTANT Resistant     IMIPENEM <=0.25 SENSITIVE Sensitive     NITROFURANTOIN 128 RESISTANT Resistant     TRIMETH/SULFA <=20 SENSITIVE Sensitive     AMPICILLIN/SULBACTAM >=32 RESISTANT Resistant     PIP/TAZO 16 SENSITIVE Sensitive     * >=100,000 COLONIES/mL ESCHERICHIA COLI   Proteus mirabilis - MIC*    AMPICILLIN <=2 SENSITIVE Sensitive     CEFAZOLIN <=4 SENSITIVE Sensitive     CEFEPIME <=0.12 SENSITIVE Sensitive     CEFTRIAXONE <=0.25 SENSITIVE Sensitive     CIPROFLOXACIN <=0.25 SENSITIVE Sensitive     GENTAMICIN <=1 SENSITIVE Sensitive     IMIPENEM  2 SENSITIVE Sensitive     NITROFURANTOIN RESISTANT Resistant     TRIMETH/SULFA <=20 SENSITIVE Sensitive     AMPICILLIN/SULBACTAM <=2 SENSITIVE Sensitive     PIP/TAZO <=4 SENSITIVE Sensitive     * 80,000 COLONIES/mL PROTEUS MIRABILIS  Blood Culture (routine x 2)     Status: None   Collection Time: 12/31/20  6:09 PM   Specimen: Right Antecubital; Blood  Result Value Ref Range Status   Specimen Description RIGHT ANTECUBITAL  Final   Special Requests   Final    BOTTLES DRAWN AEROBIC AND ANAEROBIC Blood Culture adequate volume   Culture   Final    NO GROWTH 5 DAYS Performed at [facility], [address] [place], [place] [address]    Report Status 01/05/2021 FINAL  Final  Resp Panel by RT-PCR (Flu A&B, Covid) Nasopharyngeal Swab     Status: None   Collection Time: 12/31/20  6:16 PM   Specimen: Nasopharyngeal Swab; Nasopharyngeal(NP) swabs in vial transport medium  Result Value Ref Range Status   SARS Coronavirus 2 by RT PCR NEGATIVE NEGATIVE Final    Comment: (NOTE) SARS-CoV-2 target nucleic acids are NOT DETECTED.  The SARS-CoV-2 RNA is generally detectable in upper respiratory specimens during the acute phase of infection. The lowest concentration of SARS-CoV-2 viral copies this assay can detect is 138 copies/mL. A negative result does not preclude SARS-Cov-2 infection and should not be used as the sole basis for treatment or other patient management decisions. A negative result may occur with  improper specimen collection/handling, submission of specimen other than nasopharyngeal swab, presence of viral mutation(s) within the areas targeted by this assay, and inadequate number of viral copies(<138 copies/mL). A negative result must be combined with clinical observations, patient history, and epidemiological information. The expected result is Negative.  Fact Sheet for Patients:  EntrepreneurPulse.com.au  Fact Sheet for Healthcare Providers:   IncredibleEmployment.be  This test is no t yet approved or cleared by the [place] FDA and  has been authorized for detection and/or diagnosis of SARS-CoV-2 by FDA under an Emergency Use Authorization (EUA). This EUA will remain  in effect (meaning  this test can be used) for the duration of the COVID-19 declaration under Section 564(b)(1) of the Act, 21 U.S.C.section 360bbb-3(b)(1), unless the authorization is terminated  or revoked sooner.       Influenza A by PCR NEGATIVE NEGATIVE Final   Influenza B by PCR NEGATIVE NEGATIVE Final    Comment: (NOTE) The Xpert Xpress SARS-CoV-2/FLU/RSV plus assay is intended as an aid in the diagnosis of influenza from Nasopharyngeal swab specimens and should not be used as a sole basis for treatment. Nasal washings and aspirates are unacceptable for Xpert Xpress SARS-CoV-2/FLU/RSV testing.  Fact Sheet for Patients: EntrepreneurPulse.com.au  Fact Sheet for Healthcare Providers: IncredibleEmployment.be  This test is not yet approved or cleared by the [place] FDA and has been authorized for detection and/or diagnosis of SARS-CoV-2 by FDA under an Emergency Use Authorization (EUA). This EUA will remain in effect (meaning this test can be used) for the duration of the COVID-19 declaration under Section 564(b)(1) of the Act, 21 U.S.C. section 360bbb-3(b)(1), unless the authorization is terminated or revoked.  Performed at [facility], [place] [address] [place], [place] [address]   Blood Culture (routine x 2)     Status: None   Collection Time: 12/31/20  6:50 PM   Specimen: BLOOD LEFT FOREARM  Result Value Ref Range Status   Specimen Description BLOOD LEFT FOREARM  Final   Special Requests   Final    BOTTLES DRAWN AEROBIC AND ANAEROBIC Blood Culture results may not be optimal due to an inadequate volume of blood received in culture bottles   Culture   Final    NO GROWTH 5  DAYS Performed at [facility], [place] [address] [place],  [address]    Report Status 01/05/2021 FINAL  Final     Radiology Studies: No results found. CT Head Wo Contrast  Final Result    DG Chest Port 1 View  Final Result      Scheduled Meds:  (feeding supplement) PROSource Plus  30 mL Oral BID BM   amiodarone  200 mg Oral Daily   Chlorhexidine Gluconate Cloth  6 each Topical Q0600   feeding supplement (NEPRO CARB STEADY)  237 mL Oral BID BM   hydrocortisone  25 mg Rectal BID   multivitamin  1 tablet Oral QHS   phenazopyridine  200 mg Oral TID WC   polycarbophil  625 mg Oral BID AC   PRN Meds: acetaminophen, hydrOXYzine, simethicone, witch hazel-glycerin Continuous Infusions:  meropenem (MERREM) IV 500 mg (01/06/21 1527)     LOS: 5 days  Time spent: Greater than 50% of the 35 minute visit was spent in counseling/coordination of care for the patient as laid out in the A&P.   Dwyane Dee, MD [facility] 01/06/2021, 5:28 PM

## 2021-01-06 NOTE — TOC Progression Note (Signed)
Transition of Care [facility]) - Progression Note    Patient Details  Name: [name] MRN: [id number] Date of Birth: [birth year]  Transition of [facility]) CM/SW Contact  Sharin Mons, RN Phone Number: 01/06/2021, 10:02 AM  Clinical Narrative:    Pt with CIR denial. Declines SNF placement . Pt will d/c to home with daughter with the resumption of home health services once medically ready. Pt without DME needs . Will need transportation to home arranged with [facility] when discharged.  TOC team will continue to monitor and assist with needs....  Jakaria Lavergne (Daughter)       [phone]       Expected Discharge Plan: Home w [place] Barriers to Discharge: Continued Medical Work up  Expected Discharge Plan and Services Expected Discharge Plan: [place]   Discharge Planning Services: CM Consult                               HH Arranged: PT, RN, OT, NA, SW [facility] Agency: [place] Date [facility] Agency Contacted: 01/06/21 Time HH Agency Contacted: 1001 Representative spoke with at [place]: [place] ([place]) Interventions    Readmission Risk Interventions No flowsheet data found.

## 2021-01-07 ENCOUNTER — Inpatient Hospital Stay (HOSPITAL_COMMUNITY): Payer: Medicare Other

## 2021-01-07 DIAGNOSIS — R5381 Other malaise: Secondary | ICD-10-CM | POA: Diagnosis not present

## 2021-01-07 DIAGNOSIS — K921 Melena: Secondary | ICD-10-CM | POA: Diagnosis not present

## 2021-01-07 DIAGNOSIS — K529 Noninfective gastroenteritis and colitis, unspecified: Secondary | ICD-10-CM | POA: Diagnosis not present

## 2021-01-07 DIAGNOSIS — G9341 Metabolic encephalopathy: Secondary | ICD-10-CM | POA: Diagnosis not present

## 2021-01-07 LAB — GASTROINTESTINAL PANEL BY PCR, STOOL (REPLACES STOOL CULTURE)

## 2021-01-07 LAB — BASIC METABOLIC PANEL
Anion gap: 9 (ref 5–15)
BUN: 24 mg/dL — ABNORMAL HIGH (ref 8–23)
CO2: 25 mmol/L (ref 22–32)
Calcium: 7.9 mg/dL — ABNORMAL LOW (ref 8.9–10.3)
Chloride: 100 mmol/L (ref 98–111)
Creatinine, Ser: 4.35 mg/dL — ABNORMAL HIGH (ref 0.44–1.00)
GFR, Estimated: 10 mL/min — ABNORMAL LOW (ref >60)
Glucose, Bld: 74 mg/dL (ref 70–99)
Potassium: 3.7 mmol/L (ref 3.5–5.1)
Sodium: 134 mmol/L — ABNORMAL LOW (ref 135–145)

## 2021-01-07 LAB — CBC WITH DIFFERENTIAL/PLATELET
Abs Immature Granulocytes: 0.19 10*3/uL — ABNORMAL HIGH (ref 0.00–0.07)
Basophils Absolute: 0.1 10*3/uL (ref 0.0–0.1)
Basophils Relative: 1 %
Eosinophils Absolute: 0.4 10*3/uL (ref 0.0–0.5)
Eosinophils Relative: 3 %
HCT: 29.7 % — ABNORMAL LOW (ref 36.0–46.0)
Hemoglobin: 9.6 g/dL — ABNORMAL LOW (ref 12.0–15.0)
Immature Granulocytes: 2 %
Lymphocytes Relative: 9 %
Lymphs Abs: 1 10*3/uL (ref 0.7–4.0)
MCH: 32.1 pg (ref 26.0–34.0)
MCHC: 32.3 g/dL (ref 30.0–36.0)
MCV: 99.3 fL (ref 80.0–100.0)
Monocytes Absolute: 0.8 10*3/uL (ref 0.1–1.0)
Monocytes Relative: 6 %
Neutro Abs: 9.8 10*3/uL — ABNORMAL HIGH (ref 1.7–7.7)
Neutrophils Relative %: 79 %
Platelets: 243 10*3/uL (ref 150–400)
RBC: 2.99 MIL/uL — ABNORMAL LOW (ref 3.87–5.11)
RDW: 17.7 % — ABNORMAL HIGH (ref 11.5–15.5)
WBC: 12.3 10*3/uL — ABNORMAL HIGH (ref 4.0–10.5)
nRBC: 0 % (ref 0.0–0.2)

## 2021-01-07 LAB — PHOSPHORUS: Phosphorus: 2.9 mg/dL (ref 2.5–4.6)

## 2021-01-07 LAB — MAGNESIUM: Magnesium: 1.9 mg/dL (ref 1.7–2.4)

## 2021-01-07 MED ORDER — IOHEXOL 9 MG/ML PO SOLN
ORAL | Status: AC
  Administered 2021-01-07: 500 mL
  Filled 2021-01-07: qty 1000

## 2021-01-07 MED ORDER — ACETAMINOPHEN 325 MG PO TABS
ORAL_TABLET | ORAL | Status: AC
  Administered 2021-01-07: 650 mg via ORAL
  Filled 2021-01-07: qty 2

## 2021-01-07 MED ORDER — HEPARIN SODIUM (PORCINE) 1000 UNIT/ML IJ SOLN
INTRAMUSCULAR | Status: AC
  Filled 2021-01-07: qty 4

## 2021-01-07 MED ORDER — GERHARDT'S BUTT CREAM
TOPICAL_CREAM | Freq: Two times a day (BID) | CUTANEOUS | Status: DC
  Filled 2021-01-07: qty 1

## 2021-01-07 MED ORDER — ALBUMIN HUMAN 25 % IV SOLN
INTRAVENOUS | Status: AC
  Administered 2021-01-07: 12.5 g via INTRAVENOUS_CENTRAL
  Filled 2021-01-07: qty 50

## 2021-01-07 MED ORDER — IOHEXOL 300 MG/ML  SOLN
100.0000 mL | Freq: Once | INTRAMUSCULAR | Status: AC | PRN
  Administered 2021-01-07: 100 mL via INTRAVENOUS

## 2021-01-07 MED ORDER — ALBUMIN HUMAN 25 % IV SOLN
12.5000 g | Freq: Once | INTRAVENOUS | Status: AC
  Administered 2021-01-09: 25 g via INTRAVENOUS

## 2021-01-07 NOTE — Progress Notes (Addendum)
[facility] Gastroenterology Progress Note  [name] 75 y.o. 10-19-1945  CC:   Diarrhea, rectal bleeding   Subjective: Patient seen and examined at bedside in dialysis unit.  No acute changes since yesterday.  ROS : Afebrile.  Negative for chest pain.   Objective: Vital signs in last 24 hours: Vitals:   01/07/21 1030 01/07/21 1100  BP: (!) 112/54 (!) 112/52  Pulse: 70 91  Resp: 11   Temp:    SpO2:      Physical Exam:  General : Elderly appearing patient, not in acute distress Abdomen : Soft, nontender, nondistended, bowel sounds present.  No peritoneal signs Psych : Mood and affect normal Lab Results: Recent Labs    01/06/21 0212 01/07/21 0315  NA 135 134*  K 3.6 3.7  CL 100 100  CO2 29 25  GLUCOSE 107* 74  BUN 15 24*  CREATININE 3.17* 4.35*  CALCIUM 7.6* 7.9*  MG 1.8 1.9  PHOS 2.0* 2.9   No results for input(s): AST, ALT, ALKPHOS, BILITOT, PROT, ALBUMIN in the last 72 hours. Recent Labs    01/06/21 0212 01/07/21 0315  WBC 8.8 12.3*  NEUTROABS 7.1 9.8*  HGB 9.0* 9.6*  HCT 27.5* 29.7*  MCV 97.2 99.3  PLT 244 243   No results for input(s): LABPROT, INR in the last 72 hours.    Assessment/Plan: -Rectal bleeding in a patient with multiple comorbidities.  Hemoglobin stable. -History of atrial fibrillation.  Was on Eliquis -End-stage renal disease on hemodialysis -Severe aortic stenosis and CHF -Acute on chronic diarrhea  Recommendations ------------------------- -Follow GI pathogen panel -check CT Abdomen pelvis with IV contrast -If ongoing symptoms, she may benefit from lower endoscopic evaluation with flex sig or colonoscopy depending on other comorbidities. -GI will follow   Otis Brace MD, [place] 01/07/2021, 11:33 AM  Contact #  [phone]

## 2021-01-07 NOTE — Progress Notes (Signed)
PT Cancellation Note  Patient Details Name: [name] MRN: [id number] DOB: 05/09/1946   Cancelled Treatment:    Reason Eval/Treat Not Completed: (P) Fatigue/lethargy limiting ability to participate;Patient declined, no reason specified (Pt reports feeling too fatigued from dailysis.  PTA provided education and encouragement to participate in transfer OOB to recliner to eat her lunch.  Pt adamantly refused and reports she will participate tomorrow.  Will f/u per POC.)   Bryceson Grape [name] 01/07/2021, 2:25 PM  Erasmo Leventhal , PTA Acute Rehabilitation Services Pager [phone] Office [phone]

## 2021-01-07 NOTE — Procedures (Signed)
I was present at this dialysis session. I have reviewed the session itself and made appropriate changes.   Vital signs in last 24 hours:  Temp:  [97.6 F (36.4 C)-97.7 F (36.5 C)] 97.6 F (36.4 C) (07/21 0821) Pulse Rate:  [67-74] 67 (07/21 0821) Resp:  [16] 16 (07/21 0821) BP: (152-168)/(47-64) 161/47 (07/21 0821) SpO2:  [93 %-97 %] 93 % (07/21 0821) Weight change:  Filed Weights   01/02/21 1514 01/02/21 1855 01/05/21 0806  Weight: 66.8 kg 65.2 kg 66.6 kg    Recent Labs  Lab 01/07/21 0315  NA 134*  K 3.7  CL 100  CO2 25  GLUCOSE 74  BUN 24*  CREATININE 4.35*  CALCIUM 7.9*  PHOS 2.9    Recent Labs  Lab 12/31/20 1714 01/01/21 0342 01/05/21 0151 01/06/21 0212 01/07/21 0315  WBC 20.3*   < > 13.2* 8.8 12.3*  NEUTROABS 18.5*  --   --  7.1 9.8*  HGB 9.2*   < > 8.8* 9.0* 9.6*  HCT 29.0*   < > 26.9* 27.5* 29.7*  MCV 98.0   < > 96.1 97.2 99.3  PLT 267   < > 262 244 243   < > = values in this interval not displayed.    Scheduled Meds:  (feeding supplement) PROSource Plus  30 mL Oral BID BM   amiodarone  200 mg Oral Daily   Chlorhexidine Gluconate Cloth  6 each Topical Q0600   feeding supplement (NEPRO CARB STEADY)  237 mL Oral BID BM   hydrocortisone  25 mg Rectal BID   multivitamin  1 tablet Oral QHS   phenazopyridine  200 mg Oral TID WC   polycarbophil  625 mg Oral BID AC   Continuous Infusions:  meropenem (MERREM) IV Stopped (01/06/21 1557)   PRN Meds:.acetaminophen, hydrOXYzine, simethicone, witch hazel-glycerin    Dialysis Orders: [facility] TTS 4h  400/500  72kg   2/2 bath P2  Hep none  AVF and TDC, either may be used - Mircera 60ug q2 wks, last 7/14   Assessment/Plan: ESBL E.coli + Proteus UTI: On meropenem, per primary. Confusion/weakness appear to have resolved.  ESRD: Continue HD per usual TTS schedule  Hypertension/volume: Well below prior dry weight, + edema - will challenge further today and UF as tolerated.  Likely due to profound hypoalbuminemia  and third spacing.   Anemia of ESRD: Hgb 9.6 - not due for ESA yet.    Metabolic bone disease: CorrCa ok, Phos low. No binders or VDRA at this time - follow.  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 Chronic combined CHF -- volume removal with UF on HD Paroxysmal A-Fib: On Eliquis/amiodarone. Severe aortic stenosis - f/b cardiology Nutrition: Alb very low, continue supplements. Diarrhea - likely due to antibiotics, but C.diff test pending.  Donetta Potts,  MD 01/07/2021, 9:49 AM

## 2021-01-07 NOTE — Progress Notes (Signed)
Physical Therapy Treatment Patient Details Name: [name] MRN: [id number] DOB: 01/07/1946 Today's Date: 01/07/2021    History of Present Illness The pt is a 75 y.o. female  presenting 7/14 with c/o weakness, lethargy, and delirium following HD session earlier in the day. Upon workup, pt found to have UTI. PMH includes: recent admission for LUE hematoma with concern for compartment syndrome, arthritis, cardiomyopathy, CHF, ESRD, GERD, HLD, HTN, MI, a fib, L THA and L TKA.    PT Comments    Pt supine in bed on arrival.  Pt requested PTA return after encouragement from her daughter.  Tx limited as she was very tired from her dialysis tx.  Pt did perform sit to stand with small trial of side steps.  Plan to progress gt tomorrow to hall way distances.  Pt continues to benefit from skilled rehab in a post acute setting but patient and family refusing placement.  Will continue to recommend HHPT due to refusal.     Follow Up Recommendations  SNF (will require HHPT as patient refusing placement.)     Equipment Recommendations   (drop arm commode.)    Recommendations for Other Services       Precautions / Restrictions Precautions Precautions: Fall Precaution Comments: Incontinent-needs brief on during session due to bowel and bladder incontinence. Restrictions Weight Bearing Restrictions: No    Mobility  Bed Mobility Overal bed mobility: Needs Assistance Bed Mobility: Rolling;Sidelying to Sit Rolling: Mod assist;+2 for safety/equipment Sidelying to sit: Mod assist;+2 for safety/equipment       General bed mobility comments: Pt performed rolling to R and L side for pericare and new pad placement.  Pt required assistance for trunk elevation and increased time with assistance to scoot to the edge of the bed.    Transfers Overall transfer level: Needs assistance Equipment used: Rolling walker (2 wheeled) Transfers: Sit to/from Stand Sit to Stand: Mod assist (heavy mod assistance  to rise into standing)         General transfer comment: Cues for hand placement to and from seated surface.  Pt performed lateral steps to the R to move toward the [facility].  Ambulation/Gait Ambulation/Gait assistance: Mod assist Gait Distance (Feet): 4 Feet (lateral steps along edge of bed.) Assistive device: Rolling walker (2 wheeled) Gait Pattern/deviations: Step-to pattern;Decreased stride length;Trunk flexed;Narrow base of support;Shuffle     General Gait Details: Limited gt this session along side edge of bed.  Pt more fatigued as she had dialysis this am.   Stairs             Wheelchair Mobility    Modified Rankin (Stroke Patients Only)       Balance Overall balance assessment: Needs assistance Sitting-balance support: Bilateral upper extremity supported;Feet supported Sitting balance-Leahy Scale: Poor Sitting balance - Comments: reliant on BUE support   Standing balance support: Bilateral upper extremity supported;During functional activity Standing balance-Leahy Scale: Poor Standing balance comment: with Denna Haggard                            Cognition Arousal/Alertness: Awake/alert Behavior During Therapy: Anxious Overall Cognitive Status: Within Functional Limits for tasks assessed Area of Impairment: Safety/judgement;Problem solving                         Safety/Judgement: Decreased awareness of deficits   Problem Solving: Decreased initiation;Requires verbal cues General Comments: Pt initially refused session after dialysis, with encouragement  from her daughter she requested PT to return.      Exercises General Exercises - Lower Extremity Ankle Circles/Pumps: AROM;Both;15 reps;Supine Quad Sets: AROM;Both;10 reps;Supine Heel Slides: AAROM;Both;10 reps;Supine Hip ABduction/ADduction: Both;AROM;10 reps;Supine    General Comments        Pertinent Vitals/Pain Pain Assessment: Faces Faces Pain Scale: Hurts even more Pain  Location: generalized Pain Descriptors / Indicators: Discomfort Pain Intervention(s): Monitored during session;Repositioned    Home Living                      Prior Function            PT Goals (current goals can now be found in the care plan section) Acute Rehab PT Goals Patient Stated Goal: return home Potential to Achieve Goals: Fair Progress towards PT goals: Progressing toward goals    Frequency    Min 5X/week      PT Plan Current plan remains appropriate    Co-evaluation              AM-PAC PT "6 Clicks" Mobility   Outcome Measure  Help needed turning from your back to your side while in a flat bed without using bedrails?: A Lot Help needed moving from lying on your back to sitting on the side of a flat bed without using bedrails?: A Lot Help needed moving to and from a bed to a chair (including a wheelchair)?: A Lot Help needed standing up from a chair using your arms (e.g., wheelchair or bedside chair)?: A Lot Help needed to walk in hospital room?: A Lot Help needed climbing 3-5 steps with a railing? : Total 6 Click Score: 11    End of Session Equipment Utilized During Treatment: Gait belt Activity Tolerance: Patient tolerated treatment well;Patient limited by fatigue;Patient limited by pain Patient left: with bed alarm set;with family/visitor present;in bed;with call bell/phone within reach Nurse Communication: Mobility status;Need for lift equipment PT Visit Diagnosis: Other abnormalities of gait and mobility (R26.89);Muscle weakness (generalized) (M62.81);Pain     Time: 1884-1660 PT Time Calculation (min) (ACUTE ONLY): 21 min  Charges:  $Therapeutic Activity: 8-22 mins                     Erasmo Leventhal , PTA Acute Rehabilitation Services Pager [phone] Office [phone]    Jay Haskew [name] 01/07/2021, 5:02 PM

## 2021-01-07 NOTE — Progress Notes (Signed)
PT Cancellation Note  Patient Details Name: [name] MRN: [id number] DOB: 11-Feb-1946   Cancelled Treatment:    Reason Eval/Treat Not Completed: (P) Patient at procedure or test/unavailable (Pt off unit for HD , will f/u per POC.)   [name] 01/07/2021, 1:33 PM  Erasmo Leventhal , PTA Acute Rehabilitation Services Pager [phone] Office [phone]

## 2021-01-07 NOTE — Consult Note (Addendum)
   [facility] CM Inpatient Consult   01/07/2021  [name] 04/17/1946 [id number]  [place] [facility] [ACO] Patient: [name] [facility]  01/08/21 1045 am  Addendum: Came by patient's room and she is on contact precaution  Call patient's room and she did not answer to follow up on disposition needs, currently declining skilled nursing, will continue to follow up with inpatient [facility] team and progression with therapy.  Patient has referred to [facility] for chronic disease management services.  Patient has had out reach attempts by a [facility] Coordinator.  Was follow for SNF recommendations however per review of inpatient [facility] RNCM patient desires to return home. Reviewed notes of PT/OT and inpatient [facility] team for recommendations for post hospital transitional needs.  Plan: Will follow up with Inpatient Transition Of Care [TOC] team member to make aware that [facility] following.   Of note, [facility] Care Management services does not replace or interfere with any services that are needed or arranged by inpatient [facility] care management team.  For additional questions or referrals please contact:   Natividad Brood, RN BSN [facility] Liaison  [phone] business mobile phone Toll free office [phone]  Fax number: [phone] [email or web address]

## 2021-01-07 NOTE — Progress Notes (Signed)
Progress Note    [name]   IRC:[id number]  DOB: Oct 09, 1945  DOA: 12/31/2020     6  PCP: Lajean Manes, MD  CC: Confusion, weakness  Hospital Course: Ms. Trott is a 75 yo female with PMH ESRD on HD, severe AS, HFpEF, PAF on Eliquis who presented to the ER after she developed weakness, N/V.  On workup she was found to have a fever, leukocytosis, and urine specimen notable for elevated WBCs and bacteria.  There was also noted confusion from the patient on admission.  She was initially started on Rocephin and urine cultures speciated to E. coli ESBL and Proteus.  She was transitioned to meropenem. There was also some report of blood mixed in her stool.  Hemoglobin was stable on admission.  FOBT was positive.  Due to stability of hemoglobin, decision was made for monitoring however after ongoing mixed bloody stools, GI was consulted on 7/20.   Interval History:  No events overnight.  No reported blood mixed in stool this morning.  ROS: Constitutional: negative for chills and fevers, Respiratory: negative for cough, Cardiovascular: negative for chest pain, and Gastrointestinal: negative for abdominal pain  Assessment & Plan: ESRD on dialysis [facility]) - Continue dialysis per nephrology  UTI (urinary tract infection)-resolved as of 01/07/2021 - UCx growing E. Coli ESBL and proteus -Transitioned from Rocephin to meropenem after urine cultures updated - Continue treatment with meropenem for 5 days.  Started on 01/03/2021  Sepsis (HCC)-resolved as of 01/05/2021 - Febrile, leukocytosis, urinary source - Continue meropenem, see UTI  Bloody stool - per daughter patient has developed mix of reddish stools recently which is abnormal for patient; ongoing chronic diarrhea as well - Hgb remains stable but daughter is requesting inpatient GI evaluation  - FOBT positive on admission - continue trending H/H and follow GI pathogen panel (Negative) - GI following as well now   Physical  deconditioning - Multifactorial in setting of underlying UTI as well as overall deconditioning from recent hospitalization in June 2022 (discharged from rehab on 6/15 after almost 30 day stay) - PT/OT following - dispo initially CIR but denied by insurance; patient declining SNF and desires d/c home when time  Chronic diarrhea - Known problem from previous hospitalization as well - Continue FiberCon - May also use Imodium as needed - GI pathogen panel negative  - GI following  Atrial fibrillation ([place]) - hold Eliquis for now - continue amio - History of hemorrhoids with some reports of scant bloody stool on admission.  Hemoglobin stable.  FOBT was positive - Continue trending hemoglobin  Aortic stenosis - followed by cardiology outpatient   Anemia in chronic kidney disease - baseline Hgb 9-10 g/dL  Acute metabolic encephalopathy-resolved as of 01/05/2021 - patient symptoms include AMS - etiology considered due to metabolic from sepsis/UTI - now back to baseline    Old records reviewed in assessment of this patient  Antimicrobials: Rocephin 12/31/2020 >> 01/03/2021 Meropenem 01/03/2021 >> 01/07/21  DVT prophylaxis: SCDs Start: 12/31/20 2058   Code Status:   Code Status: Full Code Family Communication:   Disposition Plan: Status is: Inpatient  Remains inpatient appropriate because:Unsafe d/c plan and Inpatient level of care appropriate due to severity of illness  Dispo:  Patient From: Home  Planned Disposition: Home with Health Care Svc  Medically stable for discharge: No     Risk of unplanned readmission score: Unplanned Admission- Pilot do not use: 36.68   Objective: Blood pressure 136/61, pulse 76, temperature 98.2 F (36.8 C),  temperature source Oral, resp. rate 11, height 5' 2.99" (1.6 m), weight 68.3 kg, last menstrual period 01/19/1992, SpO2 94 %.  Examination: General appearance: alert, cooperative, and no distress Head: Normocephalic, without obvious  abnormality, atraumatic Eyes:  EOMI Lungs: clear to auscultation bilaterally Heart: regular rate and rhythm and S1, S2 normal Abdomen: normal findings: bowel sounds normal and soft, non-tender Extremities:  No edema Skin: mobility and turgor normal Neurologic: Grossly normal  Consultants:  GI  Procedures:    Data Reviewed: I have personally reviewed following labs and imaging studies Results for orders placed or performed during the hospital encounter of 12/31/20 (from the past 24 hour(s))  Basic metabolic panel     Status: Abnormal   Collection Time: 01/07/21  3:15 AM  Result Value Ref Range   Sodium 134 (L) 135 - 145 mmol/L   Potassium 3.7 3.5 - 5.1 mmol/L   Chloride 100 98 - 111 mmol/L   CO2 25 22 - 32 mmol/L   Glucose, Bld 74 70 - 99 mg/dL   BUN 24 (H) 8 - 23 mg/dL   Creatinine, Ser 4.35 (H) 0.44 - 1.00 mg/dL   Calcium 7.9 (L) 8.9 - 10.3 mg/dL   GFR, Estimated 10 (L) >60 mL/min   Anion gap 9 5 - 15  CBC with Differential/Platelet     Status: Abnormal   Collection Time: 01/07/21  3:15 AM  Result Value Ref Range   WBC 12.3 (H) 4.0 - 10.5 K/uL   RBC 2.99 (L) 3.87 - 5.11 MIL/uL   Hemoglobin 9.6 (L) 12.0 - 15.0 g/dL   HCT 29.7 (L) 36.0 - 46.0 %   MCV 99.3 80.0 - 100.0 fL   MCH 32.1 26.0 - 34.0 pg   MCHC 32.3 30.0 - 36.0 g/dL   RDW 17.7 (H) 11.5 - 15.5 %   Platelets 243 150 - 400 K/uL   nRBC 0.0 0.0 - 0.2 %   Neutrophils Relative % 79 %   Neutro Abs 9.8 (H) 1.7 - 7.7 K/uL   Lymphocytes Relative 9 %   Lymphs Abs 1.0 0.7 - 4.0 K/uL   Monocytes Relative 6 %   Monocytes Absolute 0.8 0.1 - 1.0 K/uL   Eosinophils Relative 3 %   Eosinophils Absolute 0.4 0.0 - 0.5 K/uL   Basophils Relative 1 %   Basophils Absolute 0.1 0.0 - 0.1 K/uL   Immature Granulocytes 2 %   Abs Immature Granulocytes 0.19 (H) 0.00 - 0.07 K/uL  Magnesium     Status: None   Collection Time: 01/07/21  3:15 AM  Result Value Ref Range   Magnesium 1.9 1.7 - 2.4 mg/dL  Phosphorus     Status: None    Collection Time: 01/07/21  3:15 AM  Result Value Ref Range   Phosphorus 2.9 2.5 - 4.6 mg/dL    Recent Results (from the past 240 hour(s))  Urine Culture     Status: Abnormal   Collection Time: 12/31/20  5:14 PM   Specimen: Urine, Clean Catch  Result Value Ref Range Status   Specimen Description URINE, CLEAN CATCH  Final   Special Requests   Final    NONE Performed at [facility], [address] [place], [place] [address]    Culture (A)  Final    >=100,000 COLONIES/mL ESCHERICHIA COLI Confirmed Extended Spectrum Beta-Lactamase Producer (ESBL).  In bloodstream infections from ESBL organisms, carbapenems are preferred over piperacillin/tazobactam. They are shown to have a lower risk of mortality. 80,000 COLONIES/mL PROTEUS MIRABILIS  Report Status 01/03/2021 FINAL  Final   Organism ID, Bacteria ESCHERICHIA COLI (A)  Final   Organism ID, Bacteria PROTEUS MIRABILIS (A)  Final      Susceptibility   Escherichia coli - MIC*    AMPICILLIN >=32 RESISTANT Resistant     CEFAZOLIN >=64 RESISTANT Resistant     CEFEPIME >=32 RESISTANT Resistant     CEFTRIAXONE >=64 RESISTANT Resistant     CIPROFLOXACIN >=4 RESISTANT Resistant     GENTAMICIN >=16 RESISTANT Resistant     IMIPENEM <=0.25 SENSITIVE Sensitive     NITROFURANTOIN 128 RESISTANT Resistant     TRIMETH/SULFA <=20 SENSITIVE Sensitive     AMPICILLIN/SULBACTAM >=32 RESISTANT Resistant     PIP/TAZO 16 SENSITIVE Sensitive     * >=100,000 COLONIES/mL ESCHERICHIA COLI   Proteus mirabilis - MIC*    AMPICILLIN <=2 SENSITIVE Sensitive     CEFAZOLIN <=4 SENSITIVE Sensitive     CEFEPIME <=0.12 SENSITIVE Sensitive     CEFTRIAXONE <=0.25 SENSITIVE Sensitive     CIPROFLOXACIN <=0.25 SENSITIVE Sensitive     GENTAMICIN <=1 SENSITIVE Sensitive     IMIPENEM 2 SENSITIVE Sensitive     NITROFURANTOIN RESISTANT Resistant     TRIMETH/SULFA <=20 SENSITIVE Sensitive     AMPICILLIN/SULBACTAM <=2 SENSITIVE Sensitive     PIP/TAZO <=4 SENSITIVE  Sensitive     * 80,000 COLONIES/mL PROTEUS MIRABILIS  Blood Culture (routine x 2)     Status: None   Collection Time: 12/31/20  6:09 PM   Specimen: Right Antecubital; Blood  Result Value Ref Range Status   Specimen Description RIGHT ANTECUBITAL  Final   Special Requests   Final    BOTTLES DRAWN AEROBIC AND ANAEROBIC Blood Culture adequate volume   Culture   Final    NO GROWTH 5 DAYS Performed at [facility], [address], [place], [place] [address]    Report Status 01/05/2021 FINAL  Final  Resp Panel by RT-PCR (Flu A&B, Covid) Nasopharyngeal Swab     Status: None   Collection Time: 12/31/20  6:16 PM   Specimen: Nasopharyngeal Swab; Nasopharyngeal(NP) swabs in vial transport medium  Result Value Ref Range Status   SARS Coronavirus 2 by RT PCR NEGATIVE NEGATIVE Final    Comment: (NOTE) SARS-CoV-2 target nucleic acids are NOT DETECTED.  The SARS-CoV-2 RNA is generally detectable in upper respiratory specimens during the acute phase of infection. The lowest concentration of SARS-CoV-2 viral copies this assay can detect is 138 copies/mL. A negative result does not preclude SARS-Cov-2 infection and should not be used as the sole basis for treatment or other patient management decisions. A negative result may occur with  improper specimen collection/handling, submission of specimen other than nasopharyngeal swab, presence of viral mutation(s) within the areas targeted by this assay, and inadequate number of viral copies(<138 copies/mL). A negative result must be combined with clinical observations, patient history, and epidemiological information. The expected result is Negative.  Fact Sheet for Patients:  EntrepreneurPulse.com.au  Fact Sheet for Healthcare Providers:  IncredibleEmployment.be  This test is no t yet approved or cleared by the [place] FDA and  has been authorized for detection and/or diagnosis of SARS-CoV-2 by FDA  under an Emergency Use Authorization (EUA). This EUA will remain  in effect (meaning this test can be used) for the duration of the COVID-19 declaration under Section 564(b)(1) of the Act, 21 U.S.C.section 360bbb-3(b)(1), unless the authorization is terminated  or revoked sooner.       Influenza A by  PCR NEGATIVE NEGATIVE Final   Influenza B by PCR NEGATIVE NEGATIVE Final    Comment: (NOTE) The Xpert Xpress SARS-CoV-2/FLU/RSV plus assay is intended as an aid in the diagnosis of influenza from Nasopharyngeal swab specimens and should not be used as a sole basis for treatment. Nasal washings and aspirates are unacceptable for Xpert Xpress SARS-CoV-2/FLU/RSV testing.  Fact Sheet for Patients: EntrepreneurPulse.com.au  Fact Sheet for Healthcare Providers: IncredibleEmployment.be  This test is not yet approved or cleared by the [place] FDA and has been authorized for detection and/or diagnosis of SARS-CoV-2 by FDA under an Emergency Use Authorization (EUA). This EUA will remain in effect (meaning this test can be used) for the duration of the COVID-19 declaration under Section 564(b)(1) of the Act, 21 U.S.C. section 360bbb-3(b)(1), unless the authorization is terminated or revoked.  Performed at [facility], [place] [address], [place], [place] [address]   Blood Culture (routine x 2)     Status: None   Collection Time: 12/31/20  6:50 PM   Specimen: BLOOD LEFT FOREARM  Result Value Ref Range Status   Specimen Description BLOOD LEFT FOREARM  Final   Special Requests   Final    BOTTLES DRAWN AEROBIC AND ANAEROBIC Blood Culture results may not be optimal due to an inadequate volume of blood received in culture bottles   Culture   Final    NO GROWTH 5 DAYS Performed at [facility], [place] [address] [place], [place] [address]    Report Status 01/05/2021 FINAL  Final  Gastrointestinal Panel by PCR , Stool     Status: None    Collection Time: 01/05/21 12:41 PM   Specimen: Stool  Result Value Ref Range Status   Campylobacter species NOT DETECTED NOT DETECTED Final   Plesimonas shigelloides NOT DETECTED NOT DETECTED Final   Salmonella species NOT DETECTED NOT DETECTED Final   Yersinia enterocolitica NOT DETECTED NOT DETECTED Final   Vibrio species NOT DETECTED NOT DETECTED Final   Vibrio cholerae NOT DETECTED NOT DETECTED Final   Enteroaggregative E coli (EAEC) NOT DETECTED NOT DETECTED Final   Enteropathogenic E coli (EPEC) NOT DETECTED NOT DETECTED Final   Enterotoxigenic E coli (ETEC) NOT DETECTED NOT DETECTED Final   Shiga like toxin producing E coli (STEC) NOT DETECTED NOT DETECTED Final   Shigella/Enteroinvasive E coli (EIEC) NOT DETECTED NOT DETECTED Final   Cryptosporidium NOT DETECTED NOT DETECTED Final   Cyclospora cayetanensis NOT DETECTED NOT DETECTED Final   Entamoeba histolytica NOT DETECTED NOT DETECTED Final   Giardia lamblia NOT DETECTED NOT DETECTED Final   Adenovirus F40/41 NOT DETECTED NOT DETECTED Final   Astrovirus NOT DETECTED NOT DETECTED Final   Norovirus GI/GII NOT DETECTED NOT DETECTED Final   Rotavirus A NOT DETECTED NOT DETECTED Final   Sapovirus (I, II, IV, and V) NOT DETECTED NOT DETECTED Final    Comment: Performed at [facility], [address] [place], [place] [address]     Radiology Studies: No results found. CT Head Wo Contrast  Final Result    DG Chest Port 1 View  Final Result    CT ABDOMEN PELVIS W CONTRAST    (Results Pending)    Scheduled Meds:  (feeding supplement) PROSource Plus  30 mL Oral BID BM   amiodarone  200 mg Oral Daily   Chlorhexidine Gluconate Cloth  6 each Topical Q0600   feeding supplement (NEPRO CARB STEADY)  237 mL Oral BID BM   heparin sodium (porcine)  hydrocortisone  25 mg Rectal BID   multivitamin  1 tablet Oral QHS   polycarbophil  625 mg Oral BID AC   PRN Meds: acetaminophen, hydrOXYzine, simethicone, witch  hazel-glycerin Continuous Infusions:  albumin human     meropenem (MERREM) IV Stopped (01/06/21 1557)     LOS: 6 days  Time spent: Greater than 50% of the 35 minute visit was spent in counseling/coordination of care for the patient as laid out in the A&P.   Dwyane Dee, MD [facility] 01/07/2021, 2:47 PM

## 2021-01-08 DIAGNOSIS — K921 Melena: Secondary | ICD-10-CM | POA: Diagnosis not present

## 2021-01-08 DIAGNOSIS — K529 Noninfective gastroenteritis and colitis, unspecified: Secondary | ICD-10-CM | POA: Diagnosis not present

## 2021-01-08 DIAGNOSIS — G9341 Metabolic encephalopathy: Secondary | ICD-10-CM | POA: Diagnosis not present

## 2021-01-08 LAB — CBC WITH DIFFERENTIAL/PLATELET
Abs Immature Granulocytes: 0.13 10*3/uL — ABNORMAL HIGH (ref 0.00–0.07)
Basophils Absolute: 0.1 10*3/uL (ref 0.0–0.1)
Basophils Relative: 1 %
Eosinophils Absolute: 0.3 10*3/uL (ref 0.0–0.5)
Eosinophils Relative: 3 %
HCT: 28.7 % — ABNORMAL LOW (ref 36.0–46.0)
Hemoglobin: 9 g/dL — ABNORMAL LOW (ref 12.0–15.0)
Immature Granulocytes: 1 %
Lymphocytes Relative: 8 %
Lymphs Abs: 0.9 10*3/uL (ref 0.7–4.0)
MCH: 30.9 pg (ref 26.0–34.0)
MCHC: 31.4 g/dL (ref 30.0–36.0)
MCV: 98.6 fL (ref 80.0–100.0)
Monocytes Absolute: 0.8 10*3/uL (ref 0.1–1.0)
Monocytes Relative: 7 %
Neutro Abs: 9.3 10*3/uL — ABNORMAL HIGH (ref 1.7–7.7)
Neutrophils Relative %: 80 %
Platelets: 251 10*3/uL (ref 150–400)
RBC: 2.91 MIL/uL — ABNORMAL LOW (ref 3.87–5.11)
RDW: 17.9 % — ABNORMAL HIGH (ref 11.5–15.5)
WBC: 11.5 10*3/uL — ABNORMAL HIGH (ref 4.0–10.5)
nRBC: 0 % (ref 0.0–0.2)

## 2021-01-08 LAB — BASIC METABOLIC PANEL
Anion gap: 5 (ref 5–15)
BUN: 17 mg/dL (ref 8–23)
CO2: 29 mmol/L (ref 22–32)
Calcium: 7.8 mg/dL — ABNORMAL LOW (ref 8.9–10.3)
Chloride: 98 mmol/L (ref 98–111)
Creatinine, Ser: 3.53 mg/dL — ABNORMAL HIGH (ref 0.44–1.00)
GFR, Estimated: 13 mL/min — ABNORMAL LOW (ref >60)
Glucose, Bld: 82 mg/dL (ref 70–99)
Potassium: 3.4 mmol/L — ABNORMAL LOW (ref 3.5–5.1)
Sodium: 132 mmol/L — ABNORMAL LOW (ref 135–145)

## 2021-01-08 LAB — PHOSPHORUS
Phosphorus: 2.1 mg/dL — ABNORMAL LOW (ref 2.5–4.6)
Phosphorus: 2.4 mg/dL — ABNORMAL LOW (ref 2.5–4.6)

## 2021-01-08 LAB — ALBUMIN: Albumin: 1.6 g/dL — ABNORMAL LOW (ref 3.5–5.0)

## 2021-01-08 LAB — MAGNESIUM: Magnesium: 1.7 mg/dL (ref 1.7–2.4)

## 2021-01-08 MED ORDER — COLESTIPOL HCL 1 G PO TABS
2.0000 g | ORAL_TABLET | Freq: Every day | ORAL | Status: DC
  Administered 2021-01-08 – 2021-01-09 (×2): 2 g via ORAL
  Filled 2021-01-08 (×2): qty 2

## 2021-01-08 NOTE — Progress Notes (Addendum)
[name] KIDNEY Reilly Progress Note   Subjective:  Seen in room - no overnight events. Ongoing diarrhea, but GI panel is negative. S/p HD yesterday - did well, net UF 2L. Still with edema, but no CP or dyspnea. CT abd yesterday showed anasarca.  Objective Vitals:   01/07/21 1230 01/07/21 1537 01/07/21 2201 01/08/21 0828  BP: (!) 150/57 (!) 117/45 (!) 131/55 (!) 122/58  Pulse: 68 80 76 71  Resp: 16 17 18 17   Temp: 97.6 F (36.4 C) (!) 97.5 F (36.4 C) 97.7 F (36.5 C) 97.9 F (36.6 C)  TempSrc:  Oral Oral Oral  SpO2: 94% 94% 94% 95%  Weight:      Height:       Physical Exam General: Well appearing woman, NAD Heart: RRR; 4/6 murmur Lungs: CTAB Abdomen: soft Extremities: 2+ BLE edema Dialysis Access: [name] + AVF + bruit  Additional Objective Labs: Basic Metabolic Panel: Recent Labs  Lab 01/06/21 0212 01/07/21 0315 01/08/21 0343  NA 135 134* 132*  K 3.6 3.7 3.4*  CL 100 100 98  CO2 29 25 29   GLUCOSE 107* 74 82  BUN 15 24* 17  CREATININE 3.17* 4.35* 3.53*  CALCIUM 7.6* 7.9* 7.8*  PHOS 2.0* 2.9 2.1*    CBC: Recent Labs  Lab 01/03/21 1842 01/05/21 0151 01/06/21 0212 01/07/21 0315 01/08/21 0343  WBC 16.2* 13.2* 8.8 12.3* 11.5*  NEUTROABS  --   --  7.1 9.8* 9.3*  HGB 10.8* 8.8* 9.0* 9.6* 9.0*  HCT 33.7* 26.9* 27.5* 29.7* 28.7*  MCV 97.7 96.1 97.2 99.3 98.6  PLT 319 262 244 243 251   Studies/Results: CT ABDOMEN PELVIS W CONTRAST  Result Date: 01/07/2021 CLINICAL DATA:  Lower gastrointestinal bleeding EXAM: CT ABDOMEN AND PELVIS WITH CONTRAST TECHNIQUE: Multidetector CT imaging of the abdomen and pelvis was performed using the standard protocol following bolus administration of intravenous contrast. CONTRAST:  14mL OMNIPAQUE IOHEXOL 300 MG/ML  SOLN COMPARISON:  05/07/2018 FINDINGS: Lower chest: There are small bilateral pleural effusions, with compressive atelectasis of the dependent lower lobes. Moderate hiatal hernia. Hepatobiliary: No focal liver  abnormality is seen. Status post cholecystectomy. No biliary dilatation. Pancreas: Unremarkable. No pancreatic ductal dilatation or surrounding inflammatory changes. Spleen: Normal in size without focal abnormality. Adrenals/Urinary Tract: Bladder is decompressed with a Foley catheter. Evaluation of the bladder and distal ureters is limited by streak artifact from a left hip arthroplasty. There is severe bilateral renal atrophy that has progressed since prior study. Chronic distension of the bilateral ureters again noted without hydronephrosis. Mucosal enhancement of the ureters, right greater than left, could signify underlying infection. Punctate bilateral nonobstructing renal calculi, largest measuring 4 mm on the right. Stomach/Bowel: No bowel obstruction or ileus. Normal appendix right lower quadrant. Diverticulosis of the sigmoid colon without diverticulitis. Vascular/Lymphatic: Aortic atherosclerosis. No enlarged abdominal or pelvic lymph nodes. Reproductive: Status post hysterectomy. No adnexal masses. Other: No free fluid or free gas. There is diffuse body wall edema. Ventral hernia lower anterior abdominal wall again noted, now containing a segment of distal small bowel. No evidence of incarceration or obstruction. Stable small fat containing supraumbilical ventral hernia also noted. Musculoskeletal: No acute or destructive bony lesions. Unremarkable left hip arthroplasty. Reconstructed images demonstrate no additional findings. IMPRESSION: 1. Sigmoid diverticulosis without diverticulitis. 2. Diffuse anasarca. 3. Small bilateral pleural effusions. 4. Bilateral renal cortical atrophy. Mucosal enhancement of the bilateral ureters could reflect underlying infection. Please correlate with urinalysis. 5. Hiatal and midline ventral hernias as above. 6.  Aortic Atherosclerosis (  ICD10-I70.0). Electronically Signed   By: Randa Ngo M.D.   On: 01/07/2021 23:13    Medications:  albumin human      (feeding  supplement) PROSource Plus  30 mL Oral BID BM   amiodarone  200 mg Oral Daily   Chlorhexidine Gluconate Cloth  6 each Topical Q0600   feeding supplement (NEPRO CARB STEADY)  237 mL Oral BID BM   Gerhardt's butt cream   Topical BID   hydrocortisone  25 mg Rectal BID   multivitamin  1 tablet Oral QHS   polycarbophil  625 mg Oral BID AC    Dialysis Orders: [facility] TTS 4h  400/500  72kg   2/2 bath P2  Hep none  AVF and TDC, either may be used - Mircera 60ug q2 wks, last 7/14   Assessment/Plan: ESBL E.coli + Proteus UTI: On meropenem, per primary. Confusion/weakness appear to have resolved.  ESRD: Continue HD per usual TTS schedule - next tomorrow. Hypertension/volume: Well below prior dry weight, + edema on exam and CT - will challenge further as tolerated. Likely due to profound hypoalbuminemia and third spacing.   Anemia of ESRD: Hgb 9 - not due for ESA yet.    Metabolic bone disease: CorrCa ok, Phos low. No binders or VDRA at this time - follow.  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 Chronic combined CHF -- volume removal with UF on HD Paroxysmal A-Fib: On Eliquis/amiodarone. Severe aortic stenosis - f/b cardiology Nutrition: Alb very low, continue supplements. Diarrhea/intermittent BRBPR: GI consulted,GI diarrhea panel negative.  Amy Penton, [name] 01/08/2021, 11:00 AM  [name]  I have seen and examined this patient and agree with plan and assessment in the above note with renal recommendations/intervention highlighted. Will dose IV albumin with HD tomorrow and follow UF and bp. Amy John A Sinaya Minogue,MD 01/08/2021 2:16 PM

## 2021-01-08 NOTE — Progress Notes (Signed)
Progress Note    [name]   ZOX:[id number]  DOB: November 24, 1945  DOA: 12/31/2020     7  PCP: Lajean Manes, MD  CC: Confusion, weakness  Hospital Course: [name] is a 75 yo female with PMH ESRD on HD, severe AS, HFpEF, PAF on Eliquis who presented to the ER after she developed weakness, N/V.  On workup she was found to have a fever, leukocytosis, and urine specimen notable for elevated WBCs and bacteria.  There was also noted confusion from the patient on admission.  She was initially started on Rocephin and urine cultures speciated to E. coli ESBL and Proteus.  She was transitioned to meropenem. There was also some report of blood mixed in her stool.  Hemoglobin was stable on admission.  FOBT was positive.  Due to stability of hemoglobin, decision was made for monitoring however after ongoing mixed bloody stools, GI was consulted on 7/20.   Interval History:  No events overnight. Still having very loose stools. Doesn't think there was any blood in them.   ROS: Constitutional: negative for chills and fevers, Respiratory: negative for cough, Cardiovascular: negative for chest pain, and Gastrointestinal: negative for abdominal pain  Assessment & Plan: ESRD on dialysis [facility]) - Continue dialysis per nephrology  UTI (urinary tract infection)-resolved as of 01/07/2021 - UCx growing E. Coli ESBL and proteus -Transitioned from Rocephin to meropenem after urine cultures updated - Continue treatment with meropenem for 5 days.  Started on 01/03/2021  Sepsis (HCC)-resolved as of 01/05/2021 - Febrile, leukocytosis, urinary source - Continue meropenem, see UTI  Bloody stool - per daughter patient has developed mix of reddish stools recently which is abnormal for patient; ongoing chronic diarrhea as well - Hgb remains stable but daughter is requesting inpatient GI evaluation  - FOBT positive on admission - continue trending H/H and follow GI pathogen panel (Negative) - follow up  Cdiff - GI following as well now   Physical deconditioning - Multifactorial in setting of underlying UTI as well as overall deconditioning from recent hospitalization in June 2022 (discharged from rehab on 6/15 after almost 30 day stay) - PT/OT following - dispo initially CIR but denied by insurance; patient declining SNF and desires d/c home when time  Chronic diarrhea - Known problem from previous hospitalization as well - Continue FiberCon - GI pathogen panel negative  - follow up Cdiff - colestid added per GI as well - GI following  Atrial fibrillation ([place]) - hold Eliquis for now - continue amio - History of hemorrhoids with some reports of scant bloody stool on admission.  Hemoglobin stable.  FOBT was positive - Continue trending hemoglobin  Aortic stenosis - followed by cardiology outpatient   Anemia in chronic kidney disease - baseline Hgb 9-10 g/dL  Acute metabolic encephalopathy-resolved as of 01/05/2021 - patient symptoms include AMS - etiology considered due to metabolic from sepsis/UTI - now back to baseline    Old records reviewed in assessment of this patient  Antimicrobials: Rocephin 12/31/2020 >> 01/03/2021 Meropenem 01/03/2021 >> 01/07/21  DVT prophylaxis: SCDs Start: 12/31/20 2058   Code Status:   Code Status: Full Code Family Communication:   Disposition Plan: Status is: Inpatient  Remains inpatient appropriate because:Unsafe d/c plan and Inpatient level of care appropriate due to severity of illness  Dispo:  Patient From: Home  Planned Disposition: Home with Health Care Svc  Medically stable for discharge: No     Risk of unplanned readmission score: Unplanned Admission- Pilot do not use: 27.77  Objective: Blood pressure (!) 159/55, pulse 72, temperature 97.9 F (36.6 C), temperature source Oral, resp. rate 17, height 5' 2.99" (1.6 m), weight 68.3 kg, last menstrual period 01/19/1992, SpO2 96 %.  Examination: General appearance: alert,  cooperative, and no distress Head: Normocephalic, without obvious abnormality, atraumatic Eyes:  EOMI Lungs: clear to auscultation bilaterally Heart: regular rate and rhythm and S1, S2 normal Abdomen: normal findings: bowel sounds normal and soft, non-tender Extremities:  No edema Skin: mobility and turgor normal Neurologic: Grossly normal  Consultants:  GI  Procedures:    Data Reviewed: I have personally reviewed following labs and imaging studies Results for orders placed or performed during the hospital encounter of 12/31/20 (from the past 24 hour(s))  Basic metabolic panel     Status: Abnormal   Collection Time: 01/08/21  3:43 AM  Result Value Ref Range   Sodium 132 (L) 135 - 145 mmol/L   Potassium 3.4 (L) 3.5 - 5.1 mmol/L   Chloride 98 98 - 111 mmol/L   CO2 29 22 - 32 mmol/L   Glucose, Bld 82 70 - 99 mg/dL   BUN 17 8 - 23 mg/dL   Creatinine, Ser 3.53 (H) 0.44 - 1.00 mg/dL   Calcium 7.8 (L) 8.9 - 10.3 mg/dL   GFR, Estimated 13 (L) >60 mL/min   Anion gap 5 5 - 15  CBC with Differential/Platelet     Status: Abnormal   Collection Time: 01/08/21  3:43 AM  Result Value Ref Range   WBC 11.5 (H) 4.0 - 10.5 K/uL   RBC 2.91 (L) 3.87 - 5.11 MIL/uL   Hemoglobin 9.0 (L) 12.0 - 15.0 g/dL   HCT 28.7 (L) 36.0 - 46.0 %   MCV 98.6 80.0 - 100.0 fL   MCH 30.9 26.0 - 34.0 pg   MCHC 31.4 30.0 - 36.0 g/dL   RDW 17.9 (H) 11.5 - 15.5 %   Platelets 251 150 - 400 K/uL   nRBC 0.0 0.0 - 0.2 %   Neutrophils Relative % 80 %   Neutro Abs 9.3 (H) 1.7 - 7.7 K/uL   Lymphocytes Relative 8 %   Lymphs Abs 0.9 0.7 - 4.0 K/uL   Monocytes Relative 7 %   Monocytes Absolute 0.8 0.1 - 1.0 K/uL   Eosinophils Relative 3 %   Eosinophils Absolute 0.3 0.0 - 0.5 K/uL   Basophils Relative 1 %   Basophils Absolute 0.1 0.0 - 0.1 K/uL   Immature Granulocytes 1 %   Abs Immature Granulocytes 0.13 (H) 0.00 - 0.07 K/uL  Magnesium     Status: None   Collection Time: 01/08/21  3:43 AM  Result Value Ref Range    Magnesium 1.7 1.7 - 2.4 mg/dL  Phosphorus     Status: Abnormal   Collection Time: 01/08/21  3:43 AM  Result Value Ref Range   Phosphorus 2.1 (L) 2.5 - 4.6 mg/dL  Albumin     Status: Abnormal   Collection Time: 01/08/21  3:43 AM  Result Value Ref Range   Albumin 1.6 (L) 3.5 - 5.0 g/dL  Phosphorus     Status: Abnormal   Collection Time: 01/08/21 12:45 PM  Result Value Ref Range   Phosphorus 2.4 (L) 2.5 - 4.6 mg/dL    Recent Results (from the past 240 hour(s))  Urine Culture     Status: Abnormal   Collection Time: 12/31/20  5:14 PM   Specimen: Urine, Clean Catch  Result Value Ref Range Status   Specimen Description URINE, CLEAN CATCH  Final   Special Requests   Final    NONE Performed at [facility], [place] [address], [place], [place] [address]    Culture (A)  Final    >=100,000 COLONIES/mL ESCHERICHIA COLI Confirmed Extended Spectrum Beta-Lactamase Producer (ESBL).  In bloodstream infections from ESBL organisms, carbapenems are preferred over piperacillin/tazobactam. They are shown to have a lower risk of mortality. 80,000 COLONIES/mL PROTEUS MIRABILIS    Report Status 01/03/2021 FINAL  Final   Organism ID, Bacteria ESCHERICHIA COLI (A)  Final   Organism ID, Bacteria PROTEUS MIRABILIS (A)  Final      Susceptibility   Escherichia coli - MIC*    AMPICILLIN >=32 RESISTANT Resistant     CEFAZOLIN >=64 RESISTANT Resistant     CEFEPIME >=32 RESISTANT Resistant     CEFTRIAXONE >=64 RESISTANT Resistant     CIPROFLOXACIN >=4 RESISTANT Resistant     GENTAMICIN >=16 RESISTANT Resistant     IMIPENEM <=0.25 SENSITIVE Sensitive     NITROFURANTOIN 128 RESISTANT Resistant     TRIMETH/SULFA <=20 SENSITIVE Sensitive     AMPICILLIN/SULBACTAM >=32 RESISTANT Resistant     PIP/TAZO 16 SENSITIVE Sensitive     * >=100,000 COLONIES/mL ESCHERICHIA COLI   Proteus mirabilis - MIC*    AMPICILLIN <=2 SENSITIVE Sensitive     CEFAZOLIN <=4 SENSITIVE Sensitive     CEFEPIME <=0.12 SENSITIVE  Sensitive     CEFTRIAXONE <=0.25 SENSITIVE Sensitive     CIPROFLOXACIN <=0.25 SENSITIVE Sensitive     GENTAMICIN <=1 SENSITIVE Sensitive     IMIPENEM 2 SENSITIVE Sensitive     NITROFURANTOIN RESISTANT Resistant     TRIMETH/SULFA <=20 SENSITIVE Sensitive     AMPICILLIN/SULBACTAM <=2 SENSITIVE Sensitive     PIP/TAZO <=4 SENSITIVE Sensitive     * 80,000 COLONIES/mL PROTEUS MIRABILIS  Blood Culture (routine x 2)     Status: None   Collection Time: 12/31/20  6:09 PM   Specimen: Right Antecubital; Blood  Result Value Ref Range Status   Specimen Description RIGHT ANTECUBITAL  Final   Special Requests   Final    BOTTLES DRAWN AEROBIC AND ANAEROBIC Blood Culture adequate volume   Culture   Final    NO GROWTH 5 DAYS Performed at [facility], [address], [place], [place] [address]    Report Status 01/05/2021 FINAL  Final  Resp Panel by RT-PCR (Flu A&B, Covid) Nasopharyngeal Swab     Status: None   Collection Time: 12/31/20  6:16 PM   Specimen: Nasopharyngeal Swab; Nasopharyngeal(NP) swabs in vial transport medium  Result Value Ref Range Status   SARS Coronavirus 2 by RT PCR NEGATIVE NEGATIVE Final    Comment: (NOTE) SARS-CoV-2 target nucleic acids are NOT DETECTED.  The SARS-CoV-2 RNA is generally detectable in upper respiratory specimens during the acute phase of infection. The lowest concentration of SARS-CoV-2 viral copies this assay can detect is 138 copies/mL. A negative result does not preclude SARS-Cov-2 infection and should not be used as the sole basis for treatment or other patient management decisions. A negative result may occur with  improper specimen collection/handling, submission of specimen other than nasopharyngeal swab, presence of viral mutation(s) within the areas targeted by this assay, and inadequate number of viral copies(<138 copies/mL). A negative result must be combined with clinical observations, patient history, and epidemiological information.  The expected result is Negative.  Fact Sheet for Patients:  EntrepreneurPulse.com.au  Fact Sheet for Healthcare Providers:  IncredibleEmployment.be  This test is no t yet  approved or cleared by the [place] and  has been authorized for detection and/or diagnosis of SARS-CoV-2 by FDA under an Emergency Use Authorization (EUA). This EUA will remain  in effect (meaning this test can be used) for the duration of the COVID-19 declaration under Section 564(b)(1) of the Act, 21 U.S.C.section 360bbb-3(b)(1), unless the authorization is terminated  or revoked sooner.       Influenza A by PCR NEGATIVE NEGATIVE Final   Influenza B by PCR NEGATIVE NEGATIVE Final    Comment: (NOTE) The Xpert Xpress SARS-CoV-2/FLU/RSV plus assay is intended as an aid in the diagnosis of influenza from Nasopharyngeal swab specimens and should not be used as a sole basis for treatment. Nasal washings and aspirates are unacceptable for Xpert Xpress SARS-CoV-2/FLU/RSV testing.  Fact Sheet for Patients: EntrepreneurPulse.com.au  Fact Sheet for Healthcare Providers: IncredibleEmployment.be  This test is not yet approved or cleared by the [place] FDA and has been authorized for detection and/or diagnosis of SARS-CoV-2 by FDA under an Emergency Use Authorization (EUA). This EUA will remain in effect (meaning this test can be used) for the duration of the COVID-19 declaration under Section 564(b)(1) of the Act, 21 U.S.C. section 360bbb-3(b)(1), unless the authorization is terminated or revoked.  Performed at [facility], [place] [address] [place], [place] [address]   Blood Culture (routine x 2)     Status: None   Collection Time: 12/31/20  6:50 PM   Specimen: BLOOD LEFT FOREARM  Result Value Ref Range Status   Specimen Description BLOOD LEFT FOREARM  Final   Special Requests   Final    BOTTLES DRAWN AEROBIC AND  ANAEROBIC Blood Culture results may not be optimal due to an inadequate volume of blood received in culture bottles   Culture   Final    NO GROWTH 5 DAYS Performed at [facility], [place] [address] [place], [place] [address]    Report Status 01/05/2021 FINAL  Final  Gastrointestinal Panel by PCR , Stool     Status: None   Collection Time: 01/05/21 12:41 PM   Specimen: Stool  Result Value Ref Range Status   Campylobacter species NOT DETECTED NOT DETECTED Final   Plesimonas shigelloides NOT DETECTED NOT DETECTED Final   Salmonella species NOT DETECTED NOT DETECTED Final   Yersinia enterocolitica NOT DETECTED NOT DETECTED Final   Vibrio species NOT DETECTED NOT DETECTED Final   Vibrio cholerae NOT DETECTED NOT DETECTED Final   Enteroaggregative E coli (EAEC) NOT DETECTED NOT DETECTED Final   Enteropathogenic E coli (EPEC) NOT DETECTED NOT DETECTED Final   Enterotoxigenic E coli (ETEC) NOT DETECTED NOT DETECTED Final   Shiga like toxin producing E coli (STEC) NOT DETECTED NOT DETECTED Final   Shigella/Enteroinvasive E coli (EIEC) NOT DETECTED NOT DETECTED Final   Cryptosporidium NOT DETECTED NOT DETECTED Final   Cyclospora cayetanensis NOT DETECTED NOT DETECTED Final   Entamoeba histolytica NOT DETECTED NOT DETECTED Final   Giardia lamblia NOT DETECTED NOT DETECTED Final   Adenovirus F40/41 NOT DETECTED NOT DETECTED Final   Astrovirus NOT DETECTED NOT DETECTED Final   Norovirus GI/GII NOT DETECTED NOT DETECTED Final   Rotavirus A NOT DETECTED NOT DETECTED Final   Sapovirus (I, II, IV, and V) NOT DETECTED NOT DETECTED Final    Comment: Performed at [facility], [address], [place], [place] [address]     Radiology Studies: CT ABDOMEN PELVIS W CONTRAST  Result Date: 01/07/2021 CLINICAL DATA:  Lower  gastrointestinal bleeding EXAM: CT ABDOMEN AND PELVIS WITH CONTRAST TECHNIQUE: Multidetector CT imaging of the abdomen and pelvis was performed using the standard protocol  following bolus administration of intravenous contrast. CONTRAST:  114mL OMNIPAQUE IOHEXOL 300 MG/ML  SOLN COMPARISON:  05/07/2018 FINDINGS: Lower chest: There are small bilateral pleural effusions, with compressive atelectasis of the dependent lower lobes. Moderate hiatal hernia. Hepatobiliary: No focal liver abnormality is seen. Status post cholecystectomy. No biliary dilatation. Pancreas: Unremarkable. No pancreatic ductal dilatation or surrounding inflammatory changes. Spleen: Normal in size without focal abnormality. Adrenals/Urinary Tract: Bladder is decompressed with a Foley catheter. Evaluation of the bladder and distal ureters is limited by streak artifact from a left hip arthroplasty. There is severe bilateral renal atrophy that has progressed since prior study. Chronic distension of the bilateral ureters again noted without hydronephrosis. Mucosal enhancement of the ureters, right greater than left, could signify underlying infection. Punctate bilateral nonobstructing renal calculi, largest measuring 4 mm on the right. Stomach/Bowel: No bowel obstruction or ileus. Normal appendix right lower quadrant. Diverticulosis of the sigmoid colon without diverticulitis. Vascular/Lymphatic: Aortic atherosclerosis. No enlarged abdominal or pelvic lymph nodes. Reproductive: Status post hysterectomy. No adnexal masses. Other: No free fluid or free gas. There is diffuse body wall edema. Ventral hernia lower anterior abdominal wall again noted, now containing a segment of distal small bowel. No evidence of incarceration or obstruction. Stable small fat containing supraumbilical ventral hernia also noted. Musculoskeletal: No acute or destructive bony lesions. Unremarkable left hip arthroplasty. Reconstructed images demonstrate no additional findings. IMPRESSION: 1. Sigmoid diverticulosis without diverticulitis. 2. Diffuse anasarca. 3. Small bilateral pleural effusions. 4. Bilateral renal cortical atrophy. Mucosal  enhancement of the bilateral ureters could reflect underlying infection. Please correlate with urinalysis. 5. Hiatal and midline ventral hernias as above. 6.  Aortic Atherosclerosis (ICD10-I70.0). Electronically Signed   By: Randa Ngo M.D.   On: 01/07/2021 23:13   CT ABDOMEN PELVIS W CONTRAST  Final Result    CT Head Wo Contrast  Final Result    DG Chest Port 1 View  Final Result      Scheduled Meds:  (feeding supplement) PROSource Plus  30 mL Oral BID BM   amiodarone  200 mg Oral Daily   Chlorhexidine Gluconate Cloth  6 each Topical Q0600   colestipol  2 g Oral QHS   feeding supplement (NEPRO CARB STEADY)  237 mL Oral BID BM   Gerhardt's butt cream   Topical BID   hydrocortisone  25 mg Rectal BID   multivitamin  1 tablet Oral QHS   polycarbophil  625 mg Oral BID AC   PRN Meds: acetaminophen, hydrOXYzine, simethicone, witch hazel-glycerin Continuous Infusions:  albumin human       LOS: 7 days  Time spent: Greater than 50% of the 35 minute visit was spent in counseling/coordination of care for the patient as laid out in the A&P.   Dwyane Dee, MD [facility] 01/08/2021, 4:49 PM

## 2021-01-08 NOTE — Progress Notes (Signed)
Occupational Therapy Treatment Patient Details Name: [name] MRN: [id number] DOB: 1946/01/29 Today's Date: 01/08/2021    History of present illness The pt is a 75 y.o. female  presenting 7/14 with c/o weakness, lethargy, and delirium following HD session earlier in the day. Upon workup, pt found to have UTI. PMH includes: recent admission for LUE hematoma with concern for compartment syndrome, arthritis, cardiomyopathy, CHF, ESRD, GERD, HLD, HTN, MI, a fib, L THA and L TKA.   OT comments  Patient reports needing to be cleaned up as bed pad soiled, declined trying to transfer to bedside commode. Patient needing mod A with cues for body mechanics to roll and total A for peri care. Patient then mod A for trunk support with cues to push through elbow to assist sitting upright. Patient appears to become more anxious with attempts to stand. Provide cues for body mechanics and with initial stand unable to extend at hips and power up to standing. Bed height elevated to simulate patient's bed at home, patient heavy mod A to stand with walker. Patient moaning and states "I have to sit" try to redirect to take side steps to head of bed however patient ultimately sits back onto bed. After seated rest break patient needing light mod A to stand and with verbal + visual cues able to slide feet along floor towards head of bed. Patient instructed in exercises listed below and encouraged to perform while in bed in order to reduce caregiver burden with mobility and self care. Patient continues to report going home with daughter and [facility] services.    Follow Up Recommendations  SNF;Other (comment) (pt/family refusing then maximal HH therapy)    Equipment Recommendations  None recommended by OT       Precautions / Restrictions Precautions Precautions: Fall Precaution Comments: Incontinent-needs brief on during session due to bowel and bladder incontinence.       Mobility Bed Mobility Overal bed mobility:  Needs Assistance Bed Mobility: Rolling;Supine to Sit;Sit to Supine Rolling: Mod assist   Supine to sit: Mod assist;HOB elevated Sit to supine: Mod assist   General bed mobility comments: mod A for rolling to R side for perianal care, patient able to bring legs to edge of bed but needing mod A and cues to push through elbow to assist with trunk support to sitting. at end of session patient needing mod A to bring legs back onto bed. educate patient/daughter to practice trunk leans to better transfer back into bed as patient has tendency to lay straight back and ends up diagonal in bed    Transfers Overall transfer level: Needs assistance Equipment used: Rolling walker (2 wheeled) Transfers: Sit to/from Stand Sit to Stand: Mod assist;From elevated surface         General transfer comment: please see toilet transfer in ADL section    Balance Overall balance assessment: Needs assistance Sitting-balance support: Feet supported Sitting balance-Leahy Scale: Fair     Standing balance support: Bilateral upper extremity supported Standing balance-Leahy Scale: Poor Standing balance comment: initial mod progress to min A in standing with B UE support of walker                           ADL either performed or assessed with clinical judgement   ADL Overall ADL's : Needs assistance/impaired  Toilet Transfer: Moderate assistance;RW Toilet Transfer Details (indicate cue type and reason): initial sit to stand unable to power up to standing. bed height elevated closer to what patient has at home and needing heavy mod A to power up to standing. patient becomes anxious "I need to sit." after recovered patient needing little less assistance to power up to standing and with cues able to pivot feet towards head of bed ~1 foot before sitting. Toileting- Clothing Manipulation and Hygiene: Total assistance;Bed level Toileting - Clothing Manipulation Details  (indicate cue type and reason): patient reports wearing a brief at home and daughter changing it. did not want to try and transfer to bedside commode.     Functional mobility during ADLs: Moderate assistance;Rolling walker;Cueing for sequencing;Cueing for safety        Cognition Arousal/Alertness: Awake/alert Behavior During Therapy: Anxious Overall Cognitive Status: Within Functional Limits for tasks assessed                                          Exercises Exercises: Other exercises Other Exercises Other Exercises: forward punches x10, overhead punches x10, elbow flexion/extension x10, scapular retraction x10 all seated edge of bed. encourage patient to perform exercises at bed level as well as chair push ups at home in wheelchair           Pertinent Vitals/ Pain       Pain Assessment: Faces Faces Pain Scale: Hurts little more Pain Location: buttock Pain Descriptors / Indicators: Discomfort;Sore Pain Intervention(s): Monitored during session         Frequency  Min 2X/week        Progress Toward Goals  OT Goals(current goals can now be found in the care plan section)  Progress towards OT goals: Progressing toward goals  Acute Rehab OT Goals Patient Stated Goal: return home OT Goal Formulation: With patient Time For Goal Achievement: 01/15/21 Potential to Achieve Goals: Fair ADL Goals Pt Will Perform Grooming: with supervision;with set-up;sitting Pt Will Perform Upper Body Bathing: with supervision;with set-up;sitting Pt Will Perform Lower Body Bathing: with min assist;sitting/lateral leans Pt Will Perform Upper Body Dressing: with supervision;with set-up;sitting Pt Will Transfer to Toilet: with max assist;with mod assist;stand pivot transfer;bedside commode Pt Will Perform Toileting - Clothing Manipulation and hygiene: with max assist;with mod assist;sitting/lateral leans;sit to/from stand  Plan Discharge plan remains appropriate        AM-PAC OT "6 Clicks" Daily Activity     Outcome Measure   Help from another person eating meals?: None Help from another person taking care of personal grooming?: A Little Help from another person toileting, which includes using toliet, bedpan, or urinal?: Total Help from another person bathing (including washing, rinsing, drying)?: A Lot Help from another person to put on and taking off regular upper body clothing?: None Help from another person to put on and taking off regular lower body clothing?: A Lot 6 Click Score: 16    End of Session Equipment Utilized During Treatment: Gait belt;Rolling walker  OT Visit Diagnosis: Other abnormalities of gait and mobility (R26.89);Muscle weakness (generalized) (M62.81);Pain Pain - part of body:  (buttock)   Activity Tolerance Patient tolerated treatment well   Patient Left in bed;with call bell/phone within reach;with bed alarm set;with family/visitor present   Nurse Communication Mobility status        Time: 7026-3785 OT Time Calculation (min): 38 min  Charges: OT General  Charges $OT Visit: 1 Visit OT Treatments $Self Care/Home Management : 23-37 mins $Therapeutic Exercise: 8-22 mins  Delbert Phenix OT OT pager: [place] 01/08/2021, 1:44 PM

## 2021-01-08 NOTE — Progress Notes (Signed)
Physical Therapy Treatment Patient Details Name: [name] MRN: [id number] DOB: [birth year] Today's Date: 01/08/2021    History of Present Illness The pt is a 75 y.o. female  presenting 7/14 with c/o weakness, lethargy, and delirium following HD session earlier in the day. Upon workup, pt found to have UTI. PMH includes: recent admission for LUE hematoma with concern for compartment syndrome, arthritis, cardiomyopathy, CHF, ESRD, GERD, HLD, HTN, MI, a fib, L THA and L TKA.    PT Comments    Pt sleeping upon arrival to room, awoke easily and agreeable to session focused on transfer training (lateral scooting in preparation of transfer board transfers at home) and LE exercises. Pt tolerated both well, with increased time. Pt  limited by stool incontinence, requires assist for clean up. Sacral pad removed due to soiled in stool, RN aware and will replace sacral pad.    Follow Up Recommendations  SNF (will require HHPT as patient refusing placement.)     Equipment Recommendations   (drop arm commode.)    Recommendations for Other Services       Precautions / Restrictions Precautions Precautions: Fall Precaution Comments: Incontinent-needs brief on during session due to bowel and bladder incontinence. Restrictions Weight Bearing Restrictions: No    Mobility  Bed Mobility Overal bed mobility: Needs Assistance Bed Mobility: Rolling;Supine to Sit;Sit to Supine Rolling: Min assist   Supine to sit: Mod assist;HOB elevated Sit to supine: Mod assist;HOB elevated   General bed mobility comments: mod assist for rolling bilaterally for pericare for truncal translation and LE positioning. mod assist for to/from EOB for trunk and LE management, pt able to scoot self to/from EOB.    Transfers Overall transfer level: Needs assistance Equipment used: Rolling walker (2 wheeled) Transfers: Lateral/Scoot Transfers Sit to Stand: Mod assist;From elevated surface        Lateral/Scoot  Transfers: Min assist General transfer comment: min assist for hip translation, x4 lateral scoots each direction to simulate slide board transfers at home  Ambulation/Gait                 Stairs             Wheelchair Mobility    Modified Rankin (Stroke Patients Only)       Balance Overall balance assessment: Needs assistance Sitting-balance support: Feet supported Sitting balance-Leahy Scale: Fair     Standing balance support: Bilateral upper extremity supported Standing balance-Leahy Scale: Poor Standing balance comment: initial mod progress to min A in standing with B UE support of walker                            Cognition Arousal/Alertness: Awake/alert Behavior During Therapy: Anxious Overall Cognitive Status: Within Functional Limits for tasks assessed                                        Exercises General Exercises - Lower Extremity Ankle Circles/Pumps: AROM;Both;15 reps;Supine Quad Sets: AROM;Both;10 reps;Supine Heel Slides: AAROM;Both;Supine;15 reps Hip ABduction/ADduction: Both;10 reps;Supine;AAROM Hip Flexion/Marching: AAROM;Both;10 reps;Supine Other Exercises Other Exercises: forward punches x10, overhead punches x10, elbow flexion/extension x10, scapular retraction x10 all seated edge of bed. encourage patient to perform exercises at bed level as well as chair push ups at home in wheelchair    General Comments        Pertinent Vitals/Pain Pain Assessment: Faces Faces  Pain Scale: Hurts even more Pain Location: buttock Pain Descriptors / Indicators: Discomfort;Sore Pain Intervention(s): Limited activity within patient's tolerance;Monitored during session;Repositioned    Home Living                      Prior Function            PT Goals (current goals can now be found in the care plan section) Acute Rehab PT Goals Patient Stated Goal: return home PT Goal Formulation: With patient Time For  Goal Achievement: 01/15/21 Potential to Achieve Goals: Fair Progress towards PT goals: Progressing toward goals    Frequency    Min 5X/week      PT Plan Current plan remains appropriate    Co-evaluation              AM-PAC PT "6 Clicks" Mobility   Outcome Measure  Help needed turning from your back to your side while in a flat bed without using bedrails?: A Lot Help needed moving from lying on your back to sitting on the side of a flat bed without using bedrails?: A Lot Help needed moving to and from a bed to a chair (including a wheelchair)?: A Lot Help needed standing up from a chair using your arms (e.g., wheelchair or bedside chair)?: A Lot Help needed to walk in hospital room?: Total Help needed climbing 3-5 steps with a railing? : Total 6 Click Score: 10    End of Session   Activity Tolerance: Patient limited by fatigue;Patient limited by pain Patient left: with bed alarm set;in bed;with call bell/phone within reach Nurse Communication: Mobility status PT Visit Diagnosis: Other abnormalities of gait and mobility (R26.89);Muscle weakness (generalized) (M62.81);Pain     Time: 4536-4680 PT Time Calculation (min) (ACUTE ONLY): 33 min  Charges:  $Therapeutic Exercise: 8-22 mins $Therapeutic Activity: 8-22 mins                     Stacie Glaze, PT DPT Acute Rehabilitation Services Pager [phone]  Office [phone]    Louis Matte 01/08/2021, 4:42 PM

## 2021-01-08 NOTE — Progress Notes (Signed)
[facility] Gastroenterology Progress Note  [name] 75 y.o. Oct 25, 1945  CC:   Diarrhea, rectal bleeding   Subjective: Patient seen and examined at bedside .  Daughter at bedside.  She is feeling better.  Diarrhea has improved.  Had 2 bowel movement last night and 1 bowel movement this morning.  Loose stools.  Denies any further bleeding episodes.  ROS : Afebrile.  Negative for chest pain.   Objective: Vital signs in last 24 hours: Vitals:   01/07/21 2201 01/08/21 0828  BP: (!) 131/55 (!) 122/58  Pulse: 76 71  Resp: 18 17  Temp: 97.7 F (36.5 C) 97.9 F (36.6 C)  SpO2: 94% 95%    Physical Exam:  General : Elderly appearing patient, not in acute distress Abdomen : Soft, nontender, nondistended, bowel sounds present.  No peritoneal signs Psych : Mood and affect normal Lab Results: Recent Labs    01/07/21 0315 01/08/21 0343  NA 134* 132*  K 3.7 3.4*  CL 100 98  CO2 25 29  GLUCOSE 74 82  BUN 24* 17  CREATININE 4.35* 3.53*  CALCIUM 7.9* 7.8*  MG 1.9 1.7  PHOS 2.9 2.1*   Recent Labs    01/08/21 0343  ALBUMIN 1.6*   Recent Labs    01/07/21 0315 01/08/21 0343  WBC 12.3* 11.5*  NEUTROABS 9.8* 9.3*  HGB 9.6* 9.0*  HCT 29.7* 28.7*  MCV 99.3 98.6  PLT 243 251   No results for input(s): LABPROT, INR in the last 72 hours.    Assessment/Plan: -Rectal bleeding in a patient with multiple comorbidities.  Hemoglobin stable.  Resolved now. - Acute on chronic diarrhea.  Improving -S/p cholecystectomy -History of C. difficile infection -History of atrial fibrillation.  Was on Eliquis -End-stage renal disease on hemodialysis -Severe aortic stenosis and CHF  -   Recommendations ------------------------- -CT abdomen pelvis with contrast negative for acute changes. -Given her previous history of C. difficile, we will check for C. difficile.  GI pathogen panel was negative. -Start Colestid for possible bile salt diarrhea. -Okay to resume anticoagulation tomorrow  if hemoglobin stable. -GI will follow tomorrow.   Otis Brace MD, [place] 01/08/2021, 12:25 PM  Contact #  [phone]

## 2021-01-09 DIAGNOSIS — K921 Melena: Secondary | ICD-10-CM | POA: Diagnosis not present

## 2021-01-09 LAB — CBC WITH DIFFERENTIAL/PLATELET
Abs Immature Granulocytes: 0.14 10*3/uL — ABNORMAL HIGH (ref 0.00–0.07)
Basophils Absolute: 0.1 10*3/uL (ref 0.0–0.1)
Basophils Relative: 1 %
Eosinophils Absolute: 0.4 10*3/uL (ref 0.0–0.5)
Eosinophils Relative: 3 %
HCT: 26.7 % — ABNORMAL LOW (ref 36.0–46.0)
Hemoglobin: 8.5 g/dL — ABNORMAL LOW (ref 12.0–15.0)
Immature Granulocytes: 1 %
Lymphocytes Relative: 8 %
Lymphs Abs: 1 10*3/uL (ref 0.7–4.0)
MCH: 30.9 pg (ref 26.0–34.0)
MCHC: 31.8 g/dL (ref 30.0–36.0)
MCV: 97.1 fL (ref 80.0–100.0)
Monocytes Absolute: 0.8 10*3/uL (ref 0.1–1.0)
Monocytes Relative: 7 %
Neutro Abs: 9.2 10*3/uL — ABNORMAL HIGH (ref 1.7–7.7)
Neutrophils Relative %: 80 %
Platelets: 243 10*3/uL (ref 150–400)
RBC: 2.75 MIL/uL — ABNORMAL LOW (ref 3.87–5.11)
RDW: 17.9 % — ABNORMAL HIGH (ref 11.5–15.5)
WBC: 11.5 10*3/uL — ABNORMAL HIGH (ref 4.0–10.5)
nRBC: 0 % (ref 0.0–0.2)

## 2021-01-09 LAB — BASIC METABOLIC PANEL
Anion gap: 10 (ref 5–15)
BUN: 26 mg/dL — ABNORMAL HIGH (ref 8–23)
CO2: 26 mmol/L (ref 22–32)
Calcium: 8.1 mg/dL — ABNORMAL LOW (ref 8.9–10.3)
Chloride: 96 mmol/L — ABNORMAL LOW (ref 98–111)
Creatinine, Ser: 4.7 mg/dL — ABNORMAL HIGH (ref 0.44–1.00)
GFR, Estimated: 9 mL/min — ABNORMAL LOW (ref >60)
Glucose, Bld: 83 mg/dL (ref 70–99)
Potassium: 3.5 mmol/L (ref 3.5–5.1)
Sodium: 132 mmol/L — ABNORMAL LOW (ref 135–145)

## 2021-01-09 LAB — C DIFFICILE (CDIFF) QUICK SCRN (NO PCR REFLEX)
C Diff antigen: POSITIVE — AB
C Diff toxin: NEGATIVE

## 2021-01-09 LAB — PHOSPHORUS: Phosphorus: 3.1 mg/dL (ref 2.5–4.6)

## 2021-01-09 LAB — MAGNESIUM: Magnesium: 1.8 mg/dL (ref 1.7–2.4)

## 2021-01-09 MED ORDER — HEPARIN SODIUM (PORCINE) 1000 UNIT/ML IJ SOLN
3200.0000 [IU] | Freq: Once | INTRAMUSCULAR | Status: AC
  Administered 2021-01-09: 3200 [IU] via INTRAVENOUS

## 2021-01-09 MED ORDER — APIXABAN 5 MG PO TABS
5.0000 mg | ORAL_TABLET | Freq: Two times a day (BID) | ORAL | Status: DC
  Administered 2021-01-09 (×2): 5 mg via ORAL
  Filled 2021-01-09 (×2): qty 1

## 2021-01-09 MED ORDER — ALBUMIN HUMAN 25 % IV SOLN
INTRAVENOUS | Status: AC
  Filled 2021-01-09: qty 100

## 2021-01-09 MED ORDER — APIXABAN 5 MG PO TABS: 5.0000 mg | ORAL_TABLET | Freq: Two times a day (BID) | ORAL | 3 refills | Status: DC

## 2021-01-09 MED ORDER — ALBUMIN HUMAN 25 % IV SOLN: 25.0000 g | Freq: Once | INTRAVENOUS | Status: DC

## 2021-01-09 MED ORDER — COLESTIPOL HCL 1 G PO TABS: 2.0000 g | ORAL_TABLET | Freq: Every day | ORAL | 3 refills | Status: DC

## 2021-01-09 MED ORDER — APIXABAN 2.5 MG PO TABS: 2.5000 mg | ORAL_TABLET | Freq: Two times a day (BID) | ORAL | Status: DC

## 2021-01-09 NOTE — Discharge Summary (Signed)
Physician Discharge Summary   [name]:[id number] DOB: [birth year] DOA: 12/31/2020  PCP: Lajean Manes, MD  Admit date: 12/31/2020 Discharge date: 01/09/2021  Admitted From: home Disposition:  home Discharging physician: Dwyane Dee, MD  Recommendations for Outpatient Follow-up:  Follow up with GI as needed  Home Health:  Equipment/Devices:   Patient discharged to home in Discharge Condition: stable Risk of unplanned readmission score: Unplanned Admission- Pilot do not use: 31.86  CODE STATUS: ull Diet recommendation:  Diet Orders (From admission, onward)     Start     Ordered   01/09/21 0000  Diet - low sodium heart healthy        01/09/21 1550   12/31/20 2215  Diet renal [id number] Fluid restriction: 1200 mL Fluid; Room service appropriate? Yes; Fluid consistency: Thin  Diet effective now       Question Answer Comment  Fluid restriction: 1200 mL Fluid   Room service appropriate? Yes   Fluid consistency: Thin      12/31/20 2214            Hospital Course: [name] is a 75 yo female with PMH ESRD on HD, severe AS, HFpEF, PAF on Eliquis who presented to the ER after she developed weakness, N/V.  On workup she was found to have a fever, leukocytosis, and urine specimen notable for elevated WBCs and bacteria.  There was also noted confusion from the patient on admission.  She was initially started on Rocephin and urine cultures speciated to E. coli ESBL and Proteus.  She was transitioned to meropenem. There was also some report of blood mixed in her stool.  Hemoglobin was stable on admission.  FOBT was positive.  Due to stability of hemoglobin, decision was made for monitoring however after ongoing mixed bloody stools, GI was consulted on 7/20.   See below for further problem based plan  * Bloody stool-resolved as of 01/09/2021 - per daughter patient has developed mix of reddish stools recently which is abnormal for patient; ongoing chronic diarrhea as  well - Hgb remains stable but daughter is requesting inpatient GI evaluation  - FOBT positive on admission - continue trending H/H and follow GI pathogen panel (Negative) - follow up Cdiff: consistent with ongoing colonization but not true infection - Hgb stable and no further mixed stools   ESRD on dialysis [facility]) - Continue dialysis per nephrology  UTI (urinary tract infection)-resolved as of 01/07/2021 - UCx growing E. Coli ESBL and proteus -Transitioned from Rocephin to meropenem after urine cultures updated - Continue treatment with meropenem for 5 days.  Started on 01/03/2021  Sepsis (HCC)-resolved as of 01/05/2021 - Febrile, leukocytosis, urinary source - completed meropenem course inpatient   Physical deconditioning - Multifactorial in setting of underlying UTI as well as overall deconditioning from recent hospitalization in June 2022 (discharged from rehab on 6/15 after almost 30 day stay) - PT/OT following - dispo initially CIR but denied by insurance; patient declining SNF and desires d/c home when time  Chronic diarrhea - Known problem from previous hospitalization as well - Continue FiberCon and colestid - GI pathogen panel negative; Cdiff consistent with colonization  Atrial fibrillation ([place]) - eliquis resumed; discussed with pharmacist, dose changed to 5 mg BID which is appropriate for her age/weight/renal function - continue amio   Aortic stenosis - followed by cardiology outpatient   Anemia in chronic kidney disease - baseline Hgb 9-10 g/dL  Acute metabolic encephalopathy-resolved as of 01/05/2021 - patient symptoms include AMS - etiology  considered due to metabolic from sepsis/UTI - now back to baseline    The patient's chronic medical conditions were treated accordingly per the patient's home medication regimen except as noted.  On day of discharge, patient was felt deemed stable for discharge. Patient/family member advised to call PCP or come back to ER  if needed.   Principal Diagnosis: Bloody stool  Discharge Diagnoses: Active Hospital Problems   Diagnosis Date Noted   ESRD on dialysis [facility])     Priority: High   Physical deconditioning 12/31/2020    Priority: Medium   Chronic diarrhea 01/05/2021    Priority: Low   Atrial fibrillation ([place]) 06/07/2020    Priority: Low   Aortic stenosis 01/05/2021   Anemia in chronic kidney disease 11/06/2015   Essential hypertension, benign 08/14/2013    Resolved Hospital Problems   Diagnosis Date Noted Date Resolved   Bloody stool 01/06/2021 01/09/2021    Priority: Medium   UTI (urinary tract infection) 01/01/2021 01/07/2021    Priority: High   Sepsis ([place]) 09/25/2015 01/05/2021    Priority: High   Acute metabolic encephalopathy 41/28/7867 01/05/2021    Discharge Instructions     Diet - low sodium heart healthy   Complete by: As directed    Increase activity slowly   Complete by: As directed    No wound care   Complete by: As directed       Allergies as of 01/09/2021       Reactions   Astemizole Nausea And Vomiting   Fluorouracil Rash   Vicodin [hydrocodone-acetaminophen] Nausea And Vomiting   Chlorhexidine Rash   Sunburn    rash   Percocet [oxycodone-acetaminophen] Nausea And Vomiting        Medication List     STOP taking these medications    cephALEXin 250 MG capsule Commonly known as: KEFLEX   colestipol 5 g packet Commonly known as: COLESTID Replaced by: colestipol 1 g tablet       TAKE these medications    amiodarone 200 MG tablet Commonly known as: PACERONE Take 1 tablet (200 mg total) by mouth daily.   apixaban 5 MG Tabs tablet Commonly known as: ELIQUIS Take 1 tablet (5 mg total) by mouth 2 (two) times daily. What changed:  medication strength how much to take   colestipol 1 g tablet Commonly known as: COLESTID Take 2 tablets (2 g total) by mouth at bedtime. Replaces: colestipol 5 g packet   gabapentin 600 MG tablet Commonly known as:  NEURONTIN Take 0.5 tablets (300 mg total) by mouth at bedtime.   heparin 1000 unit/mL Soln injection Heparin Sodium (Porcine) 1,000 Units/mL Catheter Lock Arterial   multivitamin Tabs tablet Take 1 tablet by mouth at bedtime.   polycarbophil 625 MG tablet Commonly known as: FIBERCON Take 1 tablet (625 mg total) by mouth 2 (two) times daily before lunch and supper.   saccharomyces boulardii 250 MG capsule Commonly known as: FLORASTOR Take 1 capsule (250 mg total) by mouth 2 (two) times daily.        Follow-up Information     [facility] [place] Follow up.   Specialty: Home Health Services Why: the office will call to schedule physical therapy visits Contact information: [address] [place] [address] [phone]                Allergies  Allergen Reactions   Astemizole Nausea And Vomiting   Fluorouracil Rash   Vicodin [Hydrocodone-Acetaminophen] Nausea And Vomiting   Chlorhexidine  Rash    Sunburn    rash   Percocet [Oxycodone-Acetaminophen] Nausea And Vomiting    Consultations: GI  Discharge Exam: BP (!) 114/46 (BP Location: Right Arm)   Pulse 75   Temp 97.8 F (36.6 C) (Oral)   Resp 16   Ht 5' 2.99" (1.6 m)   Wt 68.1 kg   LMP 01/19/1992 (Approximate)   SpO2 97%   BMI 26.60 kg/m  General appearance: alert, cooperative, and no distress Head: Normocephalic, without obvious abnormality, atraumatic Eyes:  EOMI Lungs: clear to auscultation bilaterally Heart: regular rate and rhythm and S1, S2 normal Abdomen: normal findings: bowel sounds normal and soft, non-tender Extremities:  No edema Skin: mobility and turgor normal Neurologic: Grossly normal  The results of significant diagnostics from this hospitalization (including imaging, microbiology, ancillary and laboratory) are listed below for reference.   Microbiology: Recent Results (from the past 240 hour(s))  Urine Culture     Status: Abnormal   Collection Time: 12/31/20  5:14  PM   Specimen: Urine, Clean Catch  Result Value Ref Range Status   Specimen Description URINE, CLEAN CATCH  Final   Special Requests   Final    NONE Performed at [facility], [address], [place], [place] [address]    Culture (A)  Final    >=100,000 COLONIES/mL ESCHERICHIA COLI Confirmed Extended Spectrum Beta-Lactamase Producer (ESBL).  In bloodstream infections from ESBL organisms, carbapenems are preferred over piperacillin/tazobactam. They are shown to have a lower risk of mortality. 80,000 COLONIES/mL PROTEUS MIRABILIS    Report Status 01/03/2021 FINAL  Final   Organism ID, Bacteria ESCHERICHIA COLI (A)  Final   Organism ID, Bacteria PROTEUS MIRABILIS (A)  Final      Susceptibility   Escherichia coli - MIC*    AMPICILLIN >=32 RESISTANT Resistant     CEFAZOLIN >=64 RESISTANT Resistant     CEFEPIME >=32 RESISTANT Resistant     CEFTRIAXONE >=64 RESISTANT Resistant     CIPROFLOXACIN >=4 RESISTANT Resistant     GENTAMICIN >=16 RESISTANT Resistant     IMIPENEM <=0.25 SENSITIVE Sensitive     NITROFURANTOIN 128 RESISTANT Resistant     TRIMETH/SULFA <=20 SENSITIVE Sensitive     AMPICILLIN/SULBACTAM >=32 RESISTANT Resistant     PIP/TAZO 16 SENSITIVE Sensitive     * >=100,000 COLONIES/mL ESCHERICHIA COLI   Proteus mirabilis - MIC*    AMPICILLIN <=2 SENSITIVE Sensitive     CEFAZOLIN <=4 SENSITIVE Sensitive     CEFEPIME <=0.12 SENSITIVE Sensitive     CEFTRIAXONE <=0.25 SENSITIVE Sensitive     CIPROFLOXACIN <=0.25 SENSITIVE Sensitive     GENTAMICIN <=1 SENSITIVE Sensitive     IMIPENEM 2 SENSITIVE Sensitive     NITROFURANTOIN RESISTANT Resistant     TRIMETH/SULFA <=20 SENSITIVE Sensitive     AMPICILLIN/SULBACTAM <=2 SENSITIVE Sensitive     PIP/TAZO <=4 SENSITIVE Sensitive     * 80,000 COLONIES/mL PROTEUS MIRABILIS  Blood Culture (routine x 2)     Status: None   Collection Time: 12/31/20  6:09 PM   Specimen: Right Antecubital; Blood  Result Value Ref Range Status    Specimen Description RIGHT ANTECUBITAL  Final   Special Requests   Final    BOTTLES DRAWN AEROBIC AND ANAEROBIC Blood Culture adequate volume   Culture   Final    NO GROWTH 5 DAYS Performed at [facility], [address] [place], [place] [address]    Report Status 01/05/2021 FINAL  Final  Resp Panel  by RT-PCR (Flu A&B, Covid) Nasopharyngeal Swab     Status: None   Collection Time: 12/31/20  6:16 PM   Specimen: Nasopharyngeal Swab; Nasopharyngeal(NP) swabs in vial transport medium  Result Value Ref Range Status   SARS Coronavirus 2 by RT PCR NEGATIVE NEGATIVE Final    Comment: (NOTE) SARS-CoV-2 target nucleic acids are NOT DETECTED.  The SARS-CoV-2 RNA is generally detectable in upper respiratory specimens during the acute phase of infection. The lowest concentration of SARS-CoV-2 viral copies this assay can detect is 138 copies/mL. A negative result does not preclude SARS-Cov-2 infection and should not be used as the sole basis for treatment or other patient management decisions. A negative result may occur with  improper specimen collection/handling, submission of specimen other than nasopharyngeal swab, presence of viral mutation(s) within the areas targeted by this assay, and inadequate number of viral copies(<138 copies/mL). A negative result must be combined with clinical observations, patient history, and epidemiological information. The expected result is Negative.  Fact Sheet for Patients:  EntrepreneurPulse.com.au  Fact Sheet for Healthcare Providers:  IncredibleEmployment.be  This test is no t yet approved or cleared by the [place] FDA and  has been authorized for detection and/or diagnosis of SARS-CoV-2 by FDA under an Emergency Use Authorization (EUA). This EUA will remain  in effect (meaning this test can be used) for the duration of the COVID-19 declaration under Section 564(b)(1) of the Act, 21 U.S.C.section  360bbb-3(b)(1), unless the authorization is terminated  or revoked sooner.       Influenza A by PCR NEGATIVE NEGATIVE Final   Influenza B by PCR NEGATIVE NEGATIVE Final    Comment: (NOTE) The Xpert Xpress SARS-CoV-2/FLU/RSV plus assay is intended as an aid in the diagnosis of influenza from Nasopharyngeal swab specimens and should not be used as a sole basis for treatment. Nasal washings and aspirates are unacceptable for Xpert Xpress SARS-CoV-2/FLU/RSV testing.  Fact Sheet for Patients: EntrepreneurPulse.com.au  Fact Sheet for Healthcare Providers: IncredibleEmployment.be  This test is not yet approved or cleared by the [place] FDA and has been authorized for detection and/or diagnosis of SARS-CoV-2 by FDA under an Emergency Use Authorization (EUA). This EUA will remain in effect (meaning this test can be used) for the duration of the COVID-19 declaration under Section 564(b)(1) of the Act, 21 U.S.C. section 360bbb-3(b)(1), unless the authorization is terminated or revoked.  Performed at [facility], [place] [address] [place], [place] [address]   Blood Culture (routine x 2)     Status: None   Collection Time: 12/31/20  6:50 PM   Specimen: BLOOD LEFT FOREARM  Result Value Ref Range Status   Specimen Description BLOOD LEFT FOREARM  Final   Special Requests   Final    BOTTLES DRAWN AEROBIC AND ANAEROBIC Blood Culture results may not be optimal due to an inadequate volume of blood received in culture bottles   Culture   Final    NO GROWTH 5 DAYS Performed at [facility], [place] [address], [place], [place] [address]    Report Status 01/05/2021 FINAL  Final  Gastrointestinal Panel by PCR , Stool     Status: None   Collection Time: 01/05/21 12:41 PM   Specimen: Stool  Result Value Ref Range Status   Campylobacter species NOT DETECTED NOT DETECTED Final   Plesimonas shigelloides NOT DETECTED NOT DETECTED Final   Salmonella  species NOT DETECTED NOT DETECTED Final   Yersinia enterocolitica NOT DETECTED NOT DETECTED Final  Vibrio species NOT DETECTED NOT DETECTED Final   Vibrio cholerae NOT DETECTED NOT DETECTED Final   Enteroaggregative E coli (EAEC) NOT DETECTED NOT DETECTED Final   Enteropathogenic E coli (EPEC) NOT DETECTED NOT DETECTED Final   Enterotoxigenic E coli (ETEC) NOT DETECTED NOT DETECTED Final   Shiga like toxin producing E coli (STEC) NOT DETECTED NOT DETECTED Final   Shigella/Enteroinvasive E coli (EIEC) NOT DETECTED NOT DETECTED Final   Cryptosporidium NOT DETECTED NOT DETECTED Final   Cyclospora cayetanensis NOT DETECTED NOT DETECTED Final   Entamoeba histolytica NOT DETECTED NOT DETECTED Final   Giardia lamblia NOT DETECTED NOT DETECTED Final   Adenovirus F40/41 NOT DETECTED NOT DETECTED Final   Astrovirus NOT DETECTED NOT DETECTED Final   Norovirus GI/GII NOT DETECTED NOT DETECTED Final   Rotavirus A NOT DETECTED NOT DETECTED Final   Sapovirus (I, II, IV, and V) NOT DETECTED NOT DETECTED Final    Comment: Performed at [facility], [address] [place], [place] [address]  C Difficile Quick Screen (NO PCR Reflex)     Status: Abnormal   Collection Time: 01/08/21 12:38 PM   Specimen: STOOL  Result Value Ref Range Status   C Diff antigen POSITIVE (A) NEGATIVE Final   C Diff toxin NEGATIVE NEGATIVE Final   C Diff interpretation   Final    Results are indeterminate. Please contact the provider listed for your campus for C diff questions in [place].    Comment: Performed at [facility], [place] [address], [place], [place] [address]     Labs: BNP (last 3 results) Recent Labs    06/08/20 0525  BNP 6,195.0*   Basic Metabolic Panel: Recent Labs  Lab 01/05/21 0151 01/06/21 0212 01/07/21 0315 01/08/21 0343 01/08/21 1245 01/09/21 0344  NA 132* 135 134* 132*  --  132*  K 3.3* 3.6 3.7 3.4*  --  3.5  CL 98 100 100 98  --  96*  CO2 27 29 25 29   --  26  GLUCOSE 84 107*  74 82  --  83  BUN 25* 15 24* 17  --  26*  CREATININE 4.23* 3.17* 4.35* 3.53*  --  4.70*  CALCIUM 8.0* 7.6* 7.9* 7.8*  --  8.1*  MG  --  1.8 1.9 1.7  --  1.8  PHOS  --  2.0* 2.9 2.1* 2.4* 3.1   Liver Function Tests: Recent Labs  Lab 01/08/21 0343  ALBUMIN 1.6*   No results for input(s): LIPASE, AMYLASE in the last 168 hours. No results for input(s): AMMONIA in the last 168 hours. CBC: Recent Labs  Lab 01/05/21 0151 01/06/21 0212 01/07/21 0315 01/08/21 0343 01/09/21 0344  WBC 13.2* 8.8 12.3* 11.5* 11.5*  NEUTROABS  --  7.1 9.8* 9.3* 9.2*  HGB 8.8* 9.0* 9.6* 9.0* 8.5*  HCT 26.9* 27.5* 29.7* 28.7* 26.7*  MCV 96.1 97.2 99.3 98.6 97.1  PLT 262 244 243 251 243   Cardiac Enzymes: No results for input(s): CKTOTAL, CKMB, CKMBINDEX, TROPONINI in the last 168 hours. BNP: Invalid input(s): POCBNP CBG: No results for input(s): GLUCAP in the last 168 hours. D-Dimer No results for input(s): DDIMER in the last 72 hours. Hgb A1c No results for input(s): HGBA1C in the last 72 hours. Lipid Profile No results for input(s): CHOL, HDL, LDLCALC, TRIG, CHOLHDL, LDLDIRECT in the last 72 hours. Thyroid function studies No results for input(s): TSH, T4TOTAL, T3FREE, THYROIDAB in the last 72 hours.  Invalid input(s): FREET3 Anemia work up  No results for input(s): VITAMINB12, FOLATE, FERRITIN, TIBC, IRON, RETICCTPCT in the last 72 hours. Urinalysis    Component Value Date/Time   COLORURINE YELLOW 12/31/2020 1840   APPEARANCEUR TURBID (A) 12/31/2020 1840   LABSPEC  12/31/2020 1840    TEST NOT REPORTED DUE TO COLOR INTERFERENCE OF URINE PIGMENT   PHURINE  12/31/2020 1840    TEST NOT REPORTED DUE TO COLOR INTERFERENCE OF URINE PIGMENT   GLUCOSEU (A) 12/31/2020 1840    TEST NOT REPORTED DUE TO COLOR INTERFERENCE OF URINE PIGMENT   HGBUR (A) 12/31/2020 1840    TEST NOT REPORTED DUE TO COLOR INTERFERENCE OF URINE PIGMENT   BILIRUBINUR (A) 12/31/2020 1840    TEST NOT REPORTED DUE TO COLOR  INTERFERENCE OF URINE PIGMENT   BILIRUBINUR neg 09/18/2014 0920   KETONESUR (A) 12/31/2020 1840    TEST NOT REPORTED DUE TO COLOR INTERFERENCE OF URINE PIGMENT   PROTEINUR (A) 12/31/2020 1840    TEST NOT REPORTED DUE TO COLOR INTERFERENCE OF URINE PIGMENT   UROBILINOGEN 0.2 05/02/2015 2020   NITRITE (A) 12/31/2020 1840    TEST NOT REPORTED DUE TO COLOR INTERFERENCE OF URINE PIGMENT   LEUKOCYTESUR (A) 12/31/2020 1840    TEST NOT REPORTED DUE TO COLOR INTERFERENCE OF URINE PIGMENT   Sepsis Labs Invalid input(s): PROCALCITONIN,  WBC,  LACTICIDVEN Microbiology Recent Results (from the past 240 hour(s))  Urine Culture     Status: Abnormal   Collection Time: 12/31/20  5:14 PM   Specimen: Urine, Clean Catch  Result Value Ref Range Status   Specimen Description URINE, CLEAN CATCH  Final   Special Requests   Final    NONE Performed at [facility], [place] [address] [place], [place] [address]    Culture (A)  Final    >=100,000 COLONIES/mL ESCHERICHIA COLI Confirmed Extended Spectrum Beta-Lactamase Producer (ESBL).  In bloodstream infections from ESBL organisms, carbapenems are preferred over piperacillin/tazobactam. They are shown to have a lower risk of mortality. 80,000 COLONIES/mL PROTEUS MIRABILIS    Report Status 01/03/2021 FINAL  Final   Organism ID, Bacteria ESCHERICHIA COLI (A)  Final   Organism ID, Bacteria PROTEUS MIRABILIS (A)  Final      Susceptibility   Escherichia coli - MIC*    AMPICILLIN >=32 RESISTANT Resistant     CEFAZOLIN >=64 RESISTANT Resistant     CEFEPIME >=32 RESISTANT Resistant     CEFTRIAXONE >=64 RESISTANT Resistant     CIPROFLOXACIN >=4 RESISTANT Resistant     GENTAMICIN >=16 RESISTANT Resistant     IMIPENEM <=0.25 SENSITIVE Sensitive     NITROFURANTOIN 128 RESISTANT Resistant     TRIMETH/SULFA <=20 SENSITIVE Sensitive     AMPICILLIN/SULBACTAM >=32 RESISTANT Resistant     PIP/TAZO 16 SENSITIVE Sensitive     * >=100,000 COLONIES/mL ESCHERICHIA COLI    Proteus mirabilis - MIC*    AMPICILLIN <=2 SENSITIVE Sensitive     CEFAZOLIN <=4 SENSITIVE Sensitive     CEFEPIME <=0.12 SENSITIVE Sensitive     CEFTRIAXONE <=0.25 SENSITIVE Sensitive     CIPROFLOXACIN <=0.25 SENSITIVE Sensitive     GENTAMICIN <=1 SENSITIVE Sensitive     IMIPENEM 2 SENSITIVE Sensitive     NITROFURANTOIN RESISTANT Resistant     TRIMETH/SULFA <=20 SENSITIVE Sensitive     AMPICILLIN/SULBACTAM <=2 SENSITIVE Sensitive     PIP/TAZO <=4 SENSITIVE Sensitive     * 80,000 COLONIES/mL PROTEUS MIRABILIS  Blood Culture (routine x 2)     Status: None   Collection  Time: 12/31/20  6:09 PM   Specimen: Right Antecubital; Blood  Result Value Ref Range Status   Specimen Description RIGHT ANTECUBITAL  Final   Special Requests   Final    BOTTLES DRAWN AEROBIC AND ANAEROBIC Blood Culture adequate volume   Culture   Final    NO GROWTH 5 DAYS Performed at [facility], [address], [place], [place] [address]    Report Status 01/05/2021 FINAL  Final  Resp Panel by RT-PCR (Flu A&B, Covid) Nasopharyngeal Swab     Status: None   Collection Time: 12/31/20  6:16 PM   Specimen: Nasopharyngeal Swab; Nasopharyngeal(NP) swabs in vial transport medium  Result Value Ref Range Status   SARS Coronavirus 2 by RT PCR NEGATIVE NEGATIVE Final    Comment: (NOTE) SARS-CoV-2 target nucleic acids are NOT DETECTED.  The SARS-CoV-2 RNA is generally detectable in upper respiratory specimens during the acute phase of infection. The lowest concentration of SARS-CoV-2 viral copies this assay can detect is 138 copies/mL. A negative result does not preclude SARS-Cov-2 infection and should not be used as the sole basis for treatment or other patient management decisions. A negative result may occur with  improper specimen collection/handling, submission of specimen other than nasopharyngeal swab, presence of viral mutation(s) within the areas targeted by this assay, and inadequate number of  viral copies(<138 copies/mL). A negative result must be combined with clinical observations, patient history, and epidemiological information. The expected result is Negative.  Fact Sheet for Patients:  EntrepreneurPulse.com.au  Fact Sheet for Healthcare Providers:  IncredibleEmployment.be  This test is no t yet approved or cleared by the [place] FDA and  has been authorized for detection and/or diagnosis of SARS-CoV-2 by FDA under an Emergency Use Authorization (EUA). This EUA will remain  in effect (meaning this test can be used) for the duration of the COVID-19 declaration under Section 564(b)(1) of the Act, 21 U.S.C.section 360bbb-3(b)(1), unless the authorization is terminated  or revoked sooner.       Influenza A by PCR NEGATIVE NEGATIVE Final   Influenza B by PCR NEGATIVE NEGATIVE Final    Comment: (NOTE) The Xpert Xpress SARS-CoV-2/FLU/RSV plus assay is intended as an aid in the diagnosis of influenza from Nasopharyngeal swab specimens and should not be used as a sole basis for treatment. Nasal washings and aspirates are unacceptable for Xpert Xpress SARS-CoV-2/FLU/RSV testing.  Fact Sheet for Patients: EntrepreneurPulse.com.au  Fact Sheet for Healthcare Providers: IncredibleEmployment.be  This test is not yet approved or cleared by the [place] FDA and has been authorized for detection and/or diagnosis of SARS-CoV-2 by FDA under an Emergency Use Authorization (EUA). This EUA will remain in effect (meaning this test can be used) for the duration of the COVID-19 declaration under Section 564(b)(1) of the Act, 21 U.S.C. section 360bbb-3(b)(1), unless the authorization is terminated or revoked.  Performed at [facility], [place] [address], [place], [place] [address]   Blood Culture (routine x 2)     Status: None   Collection Time: 12/31/20  6:50 PM   Specimen: BLOOD LEFT FOREARM   Result Value Ref Range Status   Specimen Description BLOOD LEFT FOREARM  Final   Special Requests   Final    BOTTLES DRAWN AEROBIC AND ANAEROBIC Blood Culture results may not be optimal due to an inadequate volume of blood received in culture bottles   Culture   Final    NO GROWTH 5 DAYS Performed at [facility], [address] [place], [place] [address]    Report Status 01/05/2021 FINAL  Final  Gastrointestinal Panel by PCR , Stool     Status: None   Collection Time: 01/05/21 12:41 PM   Specimen: Stool  Result Value Ref Range Status   Campylobacter species NOT DETECTED NOT DETECTED Final   Plesimonas shigelloides NOT DETECTED NOT DETECTED Final   Salmonella species NOT DETECTED NOT DETECTED Final   Yersinia enterocolitica NOT DETECTED NOT DETECTED Final   Vibrio species NOT DETECTED NOT DETECTED Final   Vibrio cholerae NOT DETECTED NOT DETECTED Final   Enteroaggregative E coli (EAEC) NOT DETECTED NOT DETECTED Final   Enteropathogenic E coli (EPEC) NOT DETECTED NOT DETECTED Final   Enterotoxigenic E coli (ETEC) NOT DETECTED NOT DETECTED Final   Shiga like toxin producing E coli (STEC) NOT DETECTED NOT DETECTED Final   Shigella/Enteroinvasive E coli (EIEC) NOT DETECTED NOT DETECTED Final   Cryptosporidium NOT DETECTED NOT DETECTED Final   Cyclospora cayetanensis NOT DETECTED NOT DETECTED Final   Entamoeba histolytica NOT DETECTED NOT DETECTED Final   Giardia lamblia NOT DETECTED NOT DETECTED Final   Adenovirus F40/41 NOT DETECTED NOT DETECTED Final   Astrovirus NOT DETECTED NOT DETECTED Final   Norovirus GI/GII NOT DETECTED NOT DETECTED Final   Rotavirus A NOT DETECTED NOT DETECTED Final   Sapovirus (I, II, IV, and V) NOT DETECTED NOT DETECTED Final    Comment: Performed at [facility], [address] [place], [place] [address]  C Difficile Quick Screen (NO PCR Reflex)     Status: Abnormal   Collection Time: 01/08/21 12:38 PM   Specimen: STOOL  Result Value  Ref Range Status   C Diff antigen POSITIVE (A) NEGATIVE Final   C Diff toxin NEGATIVE NEGATIVE Final   C Diff interpretation   Final    Results are indeterminate. Please contact the provider listed for your campus for C diff questions in [place].    Comment: Performed at [facility], [place] [address], [place], [place] [address]    Procedures/Studies: CT Head Wo Contrast  Result Date: 12/31/2020 CLINICAL DATA:  Mental status changes. EXAM: CT HEAD WITHOUT CONTRAST TECHNIQUE: Contiguous axial images were obtained from the base of the skull through the vertex without intravenous contrast. COMPARISON:  06/07/2020 FINDINGS: Brain: There is no evidence for acute hemorrhage, hydrocephalus, mass lesion, or abnormal extra-axial fluid collection. No definite CT evidence for acute infarction. Diffuse loss of parenchymal volume is consistent with atrophy. Patchy low attenuation in the deep hemispheric and periventricular white matter is nonspecific, but likely reflects chronic microvascular ischemic demyelination. Vascular: No hyperdense vessel or unexpected calcification. Skull: No evidence for fracture. No worrisome lytic or sclerotic lesion. Sinuses/Orbits: The visualized paranasal sinuses are clear. Fluid is seen in posterior mastoid air cells bilaterally. Other: None. IMPRESSION: 1. No acute intracranial abnormality. 2. Atrophy with chronic small vessel white matter ischemic disease. 3. Bilateral mastoid effusions. Electronically Signed   By: Misty Stanley M.D.   On: 12/31/2020 18:45   CT ABDOMEN PELVIS W CONTRAST  Result Date: 01/07/2021 CLINICAL DATA:  Lower gastrointestinal bleeding EXAM: CT ABDOMEN AND PELVIS WITH CONTRAST TECHNIQUE: Multidetector CT imaging of the abdomen and pelvis was performed using the standard protocol following bolus administration of intravenous contrast. CONTRAST:  152mL OMNIPAQUE IOHEXOL 300 MG/ML  SOLN COMPARISON:  05/07/2018 FINDINGS: Lower chest: There are small bilateral  pleural effusions, with compressive atelectasis of the dependent lower lobes. Moderate hiatal hernia. Hepatobiliary: No focal liver abnormality  is seen. Status post cholecystectomy. No biliary dilatation. Pancreas: Unremarkable. No pancreatic ductal dilatation or surrounding inflammatory changes. Spleen: Normal in size without focal abnormality. Adrenals/Urinary Tract: Bladder is decompressed with a Foley catheter. Evaluation of the bladder and distal ureters is limited by streak artifact from a left hip arthroplasty. There is severe bilateral renal atrophy that has progressed since prior study. Chronic distension of the bilateral ureters again noted without hydronephrosis. Mucosal enhancement of the ureters, right greater than left, could signify underlying infection. Punctate bilateral nonobstructing renal calculi, largest measuring 4 mm on the right. Stomach/Bowel: No bowel obstruction or ileus. Normal appendix right lower quadrant. Diverticulosis of the sigmoid colon without diverticulitis. Vascular/Lymphatic: Aortic atherosclerosis. No enlarged abdominal or pelvic lymph nodes. Reproductive: Status post hysterectomy. No adnexal masses. Other: No free fluid or free gas. There is diffuse body wall edema. Ventral hernia lower anterior abdominal wall again noted, now containing a segment of distal small bowel. No evidence of incarceration or obstruction. Stable small fat containing supraumbilical ventral hernia also noted. Musculoskeletal: No acute or destructive bony lesions. Unremarkable left hip arthroplasty. Reconstructed images demonstrate no additional findings. IMPRESSION: 1. Sigmoid diverticulosis without diverticulitis. 2. Diffuse anasarca. 3. Small bilateral pleural effusions. 4. Bilateral renal cortical atrophy. Mucosal enhancement of the bilateral ureters could reflect underlying infection. Please correlate with urinalysis. 5. Hiatal and midline ventral hernias as above. 6.  Aortic Atherosclerosis  (ICD10-I70.0). Electronically Signed   By: Randa Ngo M.D.   On: 01/07/2021 23:13   DG Chest Port 1 View  Result Date: 12/31/2020 CLINICAL DATA:  Questionable sepsis. EXAM: PORTABLE CHEST 1 VIEW COMPARISON:  02/06/2021 FINDINGS: 1726 hours. The cardio pericardial silhouette is enlarged. There is pulmonary vascular congestion without overt pulmonary edema. Right IJ central line tip overlies the SVC/RA junction. Hiatal hernia noted. Interstitial markings are diffusely coarsened with chronic features. Telemetry leads overlie the chest. IMPRESSION: Cardiomegaly with vascular congestion. Hiatal hernia. Electronically Signed   By: Misty Stanley M.D.   On: 12/31/2020 18:42     Time coordinating discharge: Over 30 minutes    Dwyane Dee, MD  [facility] 01/09/2021, 3:54 PM

## 2021-01-09 NOTE — Progress Notes (Signed)
Provided discharge education to Pt and daughter, all questions and concerns addressed, Pt not in distress, [facility] called to transport Pt home.   Pt to discharge home with belongings.

## 2021-01-09 NOTE — Progress Notes (Signed)
Subjective: One loose stool last night. No further bleeding.  Objective: Vital signs in last 24 hours: Temp:  [97.5 F (36.4 C)-97.9 F (36.6 C)] 97.5 F (36.4 C) (07/23 0914) Pulse Rate:  [67-81] 81 (07/23 1030) Resp:  [15-18] 15 (07/23 1030) BP: (98-159)/(45-67) 98/45 (07/23 1030) SpO2:  [95 %-96 %] 95 % (07/23 0914) Weight:  [68.1 kg] 68.1 kg (07/23 0914) Weight change:  Last BM Date: 01/08/21  PE: GEN:  Chronically ill-appearing, NAD  Lab Results: CBC    Component Value Date/Time   WBC 11.5 (H) 01/09/2021 0344   RBC 2.75 (L) 01/09/2021 0344   HGB 8.5 (L) 01/09/2021 0344   HGB 13.6 09/16/2013 1129   HCT 26.7 (L) 01/09/2021 0344   HCT 23.6 (L) 04/08/2015 0545   PLT 243 01/09/2021 0344   MCV 97.1 01/09/2021 0344   MCH 30.9 01/09/2021 0344   MCHC 31.8 01/09/2021 0344   RDW 17.9 (H) 01/09/2021 0344   LYMPHSABS 1.0 01/09/2021 0344   MONOABS 0.8 01/09/2021 0344   EOSABS 0.4 01/09/2021 0344   BASOSABS 0.1 01/09/2021 0344  CMP     Component Value Date/Time   NA 132 (L) 01/09/2021 0344   K 3.5 01/09/2021 0344   CL 96 (L) 01/09/2021 0344   CO2 26 01/09/2021 0344   GLUCOSE 83 01/09/2021 0344   BUN 26 (H) 01/09/2021 0344   CREATININE 4.70 (H) 01/09/2021 0344   CALCIUM 8.1 (L) 01/09/2021 0344   CALCIUM 8.8 11/07/2015 1107   PROT 4.6 (L) 12/31/2020 1714   PROT 6.2 06/01/2018 1501   ALBUMIN 1.6 (L) 01/08/2021 0343   ALBUMIN 4.1 06/01/2018 1501   AST 13 (L) 12/31/2020 1714   ALT 10 12/31/2020 1714   ALKPHOS 87 12/31/2020 1714   BILITOT 0.5 12/31/2020 1714   BILITOT 0.2 06/01/2018 1501   GFRNONAA 9 (L) 01/09/2021 0344   GFRAA 8 (L) 11/13/2019 1744    Assessment:   Acute on chronic anemia.  Hgb stable past 24 hours. Hematochezia, resolved. Diarrhea, somewhat better.  Plan:   Advance diet as tolerated. Study for C. Diff is indeterminate, but given prior similar finding in December 2021, I suspect this is from colonization and would not treat at this time. OK  to resume apixaban. Continue colestid upon hospital discharge. [facility] will sign-off; please call with questions; we will arrange outpatient follow-up with [name]; thank you for the consultation.   Landry Dyke 01/09/2021, 11:19 AM   Cell [phone] If no answer or after 5 PM call [phone]

## 2021-01-09 NOTE — Progress Notes (Signed)
[facility] Progress Note   Subjective:  Seen in HD unit. UF goal 3L. Hypotensive on HD. Not symptomatic. Getting albumin x2. No sob, cp.    Objective Vitals:   01/09/21 0914 01/09/21 0917 01/09/21 0930 01/09/21 1000  BP: 133/67 (!) 144/67 (!) 125/58 (!) 106/51  Pulse: 72 70 74 73  Resp: 16 18 15 18   Temp: (!) 97.5 F (36.4 C)     TempSrc: Oral     SpO2: 95%     Weight: 68.1 kg     Height:       Physical Exam General: Well appearing woman, NAD Heart: RRR; 4/6 murmur Lungs: CTAB Abdomen: soft Extremities: 2+ BLE edema Dialysis Access: [facility] + AVF + bruit  Additional Objective Labs: Basic Metabolic Panel: Recent Labs  Lab 01/07/21 0315 01/08/21 0343 01/08/21 1245 01/09/21 0344  NA 134* 132*  --  132*  K 3.7 3.4*  --  3.5  CL 100 98  --  96*  CO2 25 29  --  26  GLUCOSE 74 82  --  83  BUN 24* 17  --  26*  CREATININE 4.35* 3.53*  --  4.70*  CALCIUM 7.9* 7.8*  --  8.1*  PHOS 2.9 2.1* 2.4* 3.1     CBC: Recent Labs  Lab 01/05/21 0151 01/05/21 0151 01/06/21 0212 01/07/21 0315 01/08/21 0343 01/09/21 0344  WBC 13.2*  --  8.8 12.3* 11.5* 11.5*  NEUTROABS  --    < > 7.1 9.8* 9.3* 9.2*  HGB 8.8*  --  9.0* 9.6* 9.0* 8.5*  HCT 26.9*  --  27.5* 29.7* 28.7* 26.7*  MCV 96.1  --  97.2 99.3 98.6 97.1  PLT 262  --  244 243 251 243   < > = values in this interval not displayed.    Studies/Results: CT ABDOMEN PELVIS W CONTRAST  Result Date: 01/07/2021 CLINICAL DATA:  Lower gastrointestinal bleeding EXAM: CT ABDOMEN AND PELVIS WITH CONTRAST TECHNIQUE: Multidetector CT imaging of the abdomen and pelvis was performed using the standard protocol following bolus administration of intravenous contrast. CONTRAST:  121mL OMNIPAQUE IOHEXOL 300 MG/ML  SOLN COMPARISON:  05/07/2018 FINDINGS: Lower chest: There are small bilateral pleural effusions, with compressive atelectasis of the dependent lower lobes. Moderate hiatal hernia. Hepatobiliary: No focal liver  abnormality is seen. Status post cholecystectomy. No biliary dilatation. Pancreas: Unremarkable. No pancreatic ductal dilatation or surrounding inflammatory changes. Spleen: Normal in size without focal abnormality. Adrenals/Urinary Tract: Bladder is decompressed with a Foley catheter. Evaluation of the bladder and distal ureters is limited by streak artifact from a left hip arthroplasty. There is severe bilateral renal atrophy that has progressed since prior study. Chronic distension of the bilateral ureters again noted without hydronephrosis. Mucosal enhancement of the ureters, right greater than left, could signify underlying infection. Punctate bilateral nonobstructing renal calculi, largest measuring 4 mm on the right. Stomach/Bowel: No bowel obstruction or ileus. Normal appendix right lower quadrant. Diverticulosis of the sigmoid colon without diverticulitis. Vascular/Lymphatic: Aortic atherosclerosis. No enlarged abdominal or pelvic lymph nodes. Reproductive: Status post hysterectomy. No adnexal masses. Other: No free fluid or free gas. There is diffuse body wall edema. Ventral hernia lower anterior abdominal wall again noted, now containing a segment of distal small bowel. No evidence of incarceration or obstruction. Stable small fat containing supraumbilical ventral hernia also noted. Musculoskeletal: No acute or destructive bony lesions. Unremarkable left hip arthroplasty. Reconstructed images demonstrate no additional findings. IMPRESSION: 1. Sigmoid diverticulosis without diverticulitis. 2. Diffuse anasarca. 3. Small  bilateral pleural effusions. 4. Bilateral renal cortical atrophy. Mucosal enhancement of the bilateral ureters could reflect underlying infection. Please correlate with urinalysis. 5. Hiatal and midline ventral hernias as above. 6.  Aortic Atherosclerosis (ICD10-I70.0). Electronically Signed   By: Randa Ngo M.D.   On: 01/07/2021 23:13    Medications:  albumin human      (feeding  supplement) PROSource Plus  30 mL Oral BID BM   amiodarone  200 mg Oral Daily   apixaban  5 mg Oral BID   Chlorhexidine Gluconate Cloth  6 each Topical Q0600   colestipol  2 g Oral QHS   feeding supplement (NEPRO CARB STEADY)  237 mL Oral BID BM   Gerhardt's butt cream   Topical BID   hydrocortisone  25 mg Rectal BID   multivitamin  1 tablet Oral QHS   polycarbophil  625 mg Oral BID AC    Dialysis Orders: [facility] TTS 4h  400/500  72kg   2/2 bath P2  Hep none  AVF and TDC, either may be used - Mircera 60ug q2 wks, last 7/14   Assessment/Plan: ESBL E.coli + Proteus UTI: On meropenem, per primary.  ESRD: Continue HD per usual TTS schedule - HD today.  Hypertension/volume: Well below prior dry weight, + edema on exam and CT - challenge further as tolerated. Likely due to profound hypoalbuminemia and third spacing.   Anemia of ESRD: Hgb 8.5 - not due for ESA yet.    Metabolic bone disease: CorrCa ok, Phos low. No binders or VDRA at this time - follow.  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 Chronic combined CHF -- volume removal with UF on HD Paroxysmal A-Fib: On Eliquis/amiodarone. Severe aortic stenosis - f/b cardiology Nutrition: Alb very low, continue supplements. Diarrhea/intermittent BRBPR: GI consulted,GI diarrhea panel negative.  Lynnda Child PA-C [facility] 01/09/2021,10:19 AM

## 2021-01-09 NOTE — Plan of Care (Signed)

## 2021-01-09 NOTE — TOC Transition Note (Signed)
Transition of Care [facility]) - CM/SW Discharge Note   Patient Details  Name: [name] MRN: [id number] Date of Birth: 1946/05/09  Transition of Care [facility]) CM/SW Contact:  Bartholomew Crews, RN Phone Number: [phone] 01/09/2021, 4:28 PM   Clinical Narrative:     Notified by nursing of patient transitioning home today. Message sent to liaison with [facility]. HH/Face to Face orders placed on 7/20. Nursing to call [facility] for transportation.   Final next level of care: Home w Home Health Services Barriers to Discharge: No Barriers Identified   Patient Goals and CMS Choice Patient states their goals for this hospitalization and ongoing recovery are:: return home   Choice offered to / list presented to : Patient  Discharge Placement                       Discharge Plan and Services   Discharge Planning Services: CM Consult                      HH Arranged: PT, RN, OT, Nurse's Aide, Social Work [facility] Agency: [place] Date [facility] Agency Contacted: 01/06/21 Time [place]: 1001 Representative spoke with at [place]: [place] ([place]) Interventions     Readmission Risk Interventions No flowsheet data found.

## 2021-01-09 NOTE — Plan of Care (Signed)

## 2021-01-11 ENCOUNTER — Telehealth (HOSPITAL_COMMUNITY): Payer: Self-pay | Admitting: Nephrology

## 2021-01-11 NOTE — Telephone Encounter (Signed)
Transition of care contact from inpatient facility  Date of Discharge: 01/09/21 Date of Contact: 01/11/21 - attempted Method of contact: Phone  Attempted to contact patient to discuss transition of care from inpatient admission. Patient did not answer the phone. Message was left on the patient's voicemail with call back number [phone].   [name], PA-C [employer] Pager [phone]

## 2021-01-13 ENCOUNTER — Other Ambulatory Visit: Payer: Self-pay | Admitting: *Deleted

## 2021-01-13 NOTE — Patient Outreach (Signed)
[place] [facility]) Care Management  01/13/2021  [name] 04-07-1946 [id number]   Referral Date: 6/15 Referral Source: Hospital liaison    Referral Reason: Post hospital discharge Insurance: [place] attempt #4, unsuccessful.    Member readmitted to hospital 7/14-7/23.  Call placed to member/daughter, no answer.  Unable to leave voice message as mailbox is full.  Will follow up within the next 3-4 business days.  Valente David, [place], MSN [place] [phone]

## 2021-01-14 DIAGNOSIS — N186 End stage renal disease: Secondary | ICD-10-CM | POA: Diagnosis not present

## 2021-01-14 DIAGNOSIS — A419 Sepsis, unspecified organism: Secondary | ICD-10-CM | POA: Diagnosis not present

## 2021-01-14 DIAGNOSIS — N390 Urinary tract infection, site not specified: Secondary | ICD-10-CM | POA: Diagnosis not present

## 2021-01-14 DIAGNOSIS — Z992 Dependence on renal dialysis: Secondary | ICD-10-CM | POA: Diagnosis not present

## 2021-01-17 DIAGNOSIS — Z992 Dependence on renal dialysis: Secondary | ICD-10-CM | POA: Diagnosis not present

## 2021-01-17 DIAGNOSIS — I129 Hypertensive chronic kidney disease with stage 1 through stage 4 chronic kidney disease, or unspecified chronic kidney disease: Secondary | ICD-10-CM | POA: Diagnosis not present

## 2021-01-17 DIAGNOSIS — N186 End stage renal disease: Secondary | ICD-10-CM | POA: Diagnosis not present

## 2021-01-19 ENCOUNTER — Other Ambulatory Visit: Payer: Self-pay | Admitting: *Deleted

## 2021-01-19 NOTE — Patient Outreach (Signed)
[place] [facility]) Care Management  01/19/2021  CARMON BRIGANDI Oct 07, 1945 [id number]   Referral Date: 6/15 Referral Source: Hospital liaison    Referral Reason: Post hospital discharge Insurance: [place] attempt #4, unsuccessful.     Member readmitted to hospital 7/14-7/23.  Call placed to member/daughter, no answer.  Unable to leave voice message as mailbox is full.  Will send new unsuccessful outreach letter and follow up within the next 3-4 business days for 6th and final attempt.  Valente David, [place], MSN [place] [phone]

## 2021-01-25 ENCOUNTER — Other Ambulatory Visit: Payer: Self-pay | Admitting: *Deleted

## 2021-01-25 NOTE — Patient Outreach (Signed)
[place] [facility]) Care Management  01/25/2021  [name] November 20, 1945 [id number]   Referral Date: 6/15 Referral Source: Hospital liaison    Referral Reason: Post hospital discharge Insurance: [place] attempt #6, unsuccessful.     Member readmitted to hospital 7/14-7/23.  Call placed to member/daughter, no answer.  Unable to leave voice message as mailbox is full.  No response from member after multiple unsuccessful outreach attempts and letter sent.  Will close case at this time due to inability to maintain contact.  Will notify member and primary MD of case closure.  Valente David, [place], MSN [place] [phone]

## 2021-01-27 DIAGNOSIS — K625 Hemorrhage of anus and rectum: Secondary | ICD-10-CM | POA: Diagnosis not present

## 2021-01-27 DIAGNOSIS — A0472 Enterocolitis due to Clostridium difficile, not specified as recurrent: Secondary | ICD-10-CM | POA: Diagnosis not present

## 2021-01-27 DIAGNOSIS — R197 Diarrhea, unspecified: Secondary | ICD-10-CM | POA: Diagnosis not present

## 2021-01-27 DIAGNOSIS — R799 Abnormal finding of blood chemistry, unspecified: Secondary | ICD-10-CM | POA: Diagnosis not present

## 2021-01-27 DIAGNOSIS — K648 Other hemorrhoids: Secondary | ICD-10-CM | POA: Diagnosis not present

## 2021-02-17 DIAGNOSIS — N186 End stage renal disease: Secondary | ICD-10-CM | POA: Diagnosis not present

## 2021-02-17 DIAGNOSIS — Z992 Dependence on renal dialysis: Secondary | ICD-10-CM | POA: Diagnosis not present

## 2021-02-17 DIAGNOSIS — I129 Hypertensive chronic kidney disease with stage 1 through stage 4 chronic kidney disease, or unspecified chronic kidney disease: Secondary | ICD-10-CM | POA: Diagnosis not present

## 2021-02-17 DIAGNOSIS — R197 Diarrhea, unspecified: Secondary | ICD-10-CM | POA: Diagnosis not present

## 2021-02-17 DIAGNOSIS — D649 Anemia, unspecified: Secondary | ICD-10-CM | POA: Diagnosis not present

## 2021-02-23 ENCOUNTER — Encounter: Payer: Self-pay | Admitting: Internal Medicine

## 2021-02-23 ENCOUNTER — Other Ambulatory Visit: Payer: Self-pay | Admitting: Gastroenterology

## 2021-02-25 ENCOUNTER — Emergency Department (HOSPITAL_COMMUNITY): Payer: Medicare Other

## 2021-02-25 ENCOUNTER — Inpatient Hospital Stay (HOSPITAL_COMMUNITY)
Admission: EM | Admit: 2021-02-25 | Discharge: 2021-03-03 | DRG: 371 | Disposition: A | Discharge status: Home Health | Payer: Medicare Other | Attending: Internal Medicine | Admitting: Internal Medicine

## 2021-02-25 ENCOUNTER — Other Ambulatory Visit: Payer: Self-pay

## 2021-02-25 ENCOUNTER — Encounter (HOSPITAL_COMMUNITY): Payer: Self-pay | Admitting: Emergency Medicine

## 2021-02-25 DIAGNOSIS — E43 Unspecified severe protein-calorie malnutrition: Secondary | ICD-10-CM | POA: Diagnosis present

## 2021-02-25 DIAGNOSIS — D631 Anemia in chronic kidney disease: Secondary | ICD-10-CM | POA: Diagnosis present

## 2021-02-25 DIAGNOSIS — I5032 Chronic diastolic (congestive) heart failure: Secondary | ICD-10-CM | POA: Diagnosis not present

## 2021-02-25 DIAGNOSIS — Z87440 Personal history of urinary (tract) infections: Secondary | ICD-10-CM

## 2021-02-25 DIAGNOSIS — K219 Gastro-esophageal reflux disease without esophagitis: Secondary | ICD-10-CM | POA: Diagnosis present

## 2021-02-25 DIAGNOSIS — H409 Unspecified glaucoma: Secondary | ICD-10-CM | POA: Diagnosis present

## 2021-02-25 DIAGNOSIS — Z20822 Contact with and (suspected) exposure to covid-19: Secondary | ICD-10-CM | POA: Diagnosis not present

## 2021-02-25 DIAGNOSIS — E785 Hyperlipidemia, unspecified: Secondary | ICD-10-CM | POA: Diagnosis present

## 2021-02-25 DIAGNOSIS — Z96652 Presence of left artificial knee joint: Secondary | ICD-10-CM | POA: Diagnosis present

## 2021-02-25 DIAGNOSIS — D649 Anemia, unspecified: Secondary | ICD-10-CM | POA: Diagnosis not present

## 2021-02-25 DIAGNOSIS — Z79899 Other long term (current) drug therapy: Secondary | ICD-10-CM | POA: Diagnosis not present

## 2021-02-25 DIAGNOSIS — I480 Paroxysmal atrial fibrillation: Secondary | ICD-10-CM | POA: Diagnosis present

## 2021-02-25 DIAGNOSIS — I429 Cardiomyopathy, unspecified: Secondary | ICD-10-CM | POA: Diagnosis not present

## 2021-02-25 DIAGNOSIS — Z885 Allergy status to narcotic agent status: Secondary | ICD-10-CM | POA: Diagnosis not present

## 2021-02-25 DIAGNOSIS — R197 Diarrhea, unspecified: Secondary | ICD-10-CM | POA: Diagnosis not present

## 2021-02-25 DIAGNOSIS — K625 Hemorrhage of anus and rectum: Secondary | ICD-10-CM | POA: Diagnosis not present

## 2021-02-25 DIAGNOSIS — K922 Gastrointestinal hemorrhage, unspecified: Secondary | ICD-10-CM | POA: Diagnosis present

## 2021-02-25 DIAGNOSIS — R5383 Other fatigue: Secondary | ICD-10-CM | POA: Diagnosis not present

## 2021-02-25 DIAGNOSIS — Z6826 Body mass index (BMI) 26.0-26.9, adult: Secondary | ICD-10-CM | POA: Diagnosis not present

## 2021-02-25 DIAGNOSIS — Z96642 Presence of left artificial hip joint: Secondary | ICD-10-CM | POA: Diagnosis present

## 2021-02-25 DIAGNOSIS — I350 Nonrheumatic aortic (valve) stenosis: Secondary | ICD-10-CM | POA: Diagnosis not present

## 2021-02-25 DIAGNOSIS — A0471 Enterocolitis due to Clostridium difficile, recurrent: Secondary | ICD-10-CM | POA: Diagnosis not present

## 2021-02-25 DIAGNOSIS — Z8249 Family history of ischemic heart disease and other diseases of the circulatory system: Secondary | ICD-10-CM

## 2021-02-25 DIAGNOSIS — I509 Heart failure, unspecified: Secondary | ICD-10-CM | POA: Diagnosis not present

## 2021-02-25 DIAGNOSIS — R531 Weakness: Secondary | ICD-10-CM

## 2021-02-25 DIAGNOSIS — Z888 Allergy status to other drugs, medicaments and biological substances status: Secondary | ICD-10-CM

## 2021-02-25 DIAGNOSIS — I517 Cardiomegaly: Secondary | ICD-10-CM | POA: Diagnosis not present

## 2021-02-25 DIAGNOSIS — Z7901 Long term (current) use of anticoagulants: Secondary | ICD-10-CM

## 2021-02-25 DIAGNOSIS — K529 Noninfective gastroenteritis and colitis, unspecified: Secondary | ICD-10-CM | POA: Diagnosis present

## 2021-02-25 DIAGNOSIS — I132 Hypertensive heart and chronic kidney disease with heart failure and with stage 5 chronic kidney disease, or end stage renal disease: Secondary | ICD-10-CM | POA: Diagnosis not present

## 2021-02-25 DIAGNOSIS — Z992 Dependence on renal dialysis: Secondary | ICD-10-CM

## 2021-02-25 DIAGNOSIS — N186 End stage renal disease: Secondary | ICD-10-CM | POA: Diagnosis not present

## 2021-02-25 DIAGNOSIS — M898X9 Other specified disorders of bone, unspecified site: Secondary | ICD-10-CM | POA: Diagnosis present

## 2021-02-25 DIAGNOSIS — R0902 Hypoxemia: Secondary | ICD-10-CM | POA: Diagnosis not present

## 2021-02-25 DIAGNOSIS — I10 Essential (primary) hypertension: Secondary | ICD-10-CM | POA: Diagnosis not present

## 2021-02-25 LAB — RESP PANEL BY RT-PCR (FLU A&B, COVID) ARPGX2
Influenza A by PCR: NEGATIVE
Influenza B by PCR: NEGATIVE
SARS Coronavirus 2 by RT PCR: NEGATIVE

## 2021-02-25 LAB — COMPREHENSIVE METABOLIC PANEL
ALT: 16 U/L (ref 0–44)
AST: 21 U/L (ref 15–41)
Albumin: 1.8 g/dL — ABNORMAL LOW (ref 3.5–5.0)
Alkaline Phosphatase: 97 U/L (ref 38–126)
Anion gap: 8 (ref 5–15)
BUN: 8 mg/dL (ref 8–23)
CO2: 31 mmol/L (ref 22–32)
Calcium: 7.7 mg/dL — ABNORMAL LOW (ref 8.9–10.3)
Chloride: 98 mmol/L (ref 98–111)
Creatinine, Ser: 2.11 mg/dL — ABNORMAL HIGH (ref 0.44–1.00)
GFR, Estimated: 24 mL/min — ABNORMAL LOW (ref >60)
Glucose, Bld: 81 mg/dL (ref 70–99)
Potassium: 3.7 mmol/L (ref 3.5–5.1)
Sodium: 137 mmol/L (ref 135–145)
Total Bilirubin: 0.9 mg/dL (ref 0.3–1.2)
Total Protein: 4.2 g/dL — ABNORMAL LOW (ref 6.5–8.1)

## 2021-02-25 LAB — CBC WITH DIFFERENTIAL/PLATELET
Abs Immature Granulocytes: 0.04 10*3/uL (ref 0.00–0.07)
Basophils Absolute: 0.1 10*3/uL (ref 0.0–0.1)
Basophils Relative: 0 %
Eosinophils Absolute: 0.1 10*3/uL (ref 0.0–0.5)
Eosinophils Relative: 1 %
HCT: 28.7 % — ABNORMAL LOW (ref 36.0–46.0)
Hemoglobin: 8.8 g/dL — ABNORMAL LOW (ref 12.0–15.0)
Immature Granulocytes: 0 %
Lymphocytes Relative: 6 %
Lymphs Abs: 0.7 10*3/uL (ref 0.7–4.0)
MCH: 29.9 pg (ref 26.0–34.0)
MCHC: 30.7 g/dL (ref 30.0–36.0)
MCV: 97.6 fL (ref 80.0–100.0)
Monocytes Absolute: 0.7 10*3/uL (ref 0.1–1.0)
Monocytes Relative: 6 %
Neutro Abs: 10.2 10*3/uL — ABNORMAL HIGH (ref 1.7–7.7)
Neutrophils Relative %: 87 %
Platelets: 291 10*3/uL (ref 150–400)
RBC: 2.94 MIL/uL — ABNORMAL LOW (ref 3.87–5.11)
RDW: 20.8 % — ABNORMAL HIGH (ref 11.5–15.5)
WBC: 11.8 10*3/uL — ABNORMAL HIGH (ref 4.0–10.5)
nRBC: 0 % (ref 0.0–0.2)

## 2021-02-25 LAB — POC OCCULT BLOOD, ED: Fecal Occult Bld: POSITIVE — AB

## 2021-02-25 LAB — LIPASE, BLOOD: Lipase: 17 U/L (ref 11–51)

## 2021-02-25 NOTE — ED Provider Notes (Signed)
[facility] DEPT Provider Note   CSN: [id number] Arrival date & time: 02/25/21  1815     History No chief complaint on file.   [name] is a 75 y.o. female.  HPI Patient is a 75 year old female with a history of ESRD on HD Tuesday/Thursday/Saturday, severe aortic stenosis, HFpEF, PAF on Eliquis, who presents to the emergency department due to weakness.  Patient is anticoagulated on Eliquis and during an admission in July of this year patient began developing a mix of reddish stools with ongoing chronic diarrhea.  She had a positive FOBT during her admission and was evaluated by GI.  She states that her symptoms stopped for a period of time and began over the past few days once again.  She states she has had multiple bloody stools that her daughter noticed when cleaning her.  She reports worsening fatigue and weakness over the past few days as well.  She states that she was late to dialysis this morning but was able to complete "most of her session".  Denies any chest pain, shortness of breath, abdominal pain, nausea, vomiting, URI symptoms.    Past Medical History:  Diagnosis Date   Arthritis of left knee    Cardiomyopathy ([place])    a. h/o LV dysfunction EF 20-25% in 2013 due to sepsis.>> improved to normal    Chronic diastolic CHF (congestive heart failure) ([place])    10/ 2013 in setting of septic shock   Complication of anesthesia    use a little anesthesia , per patient MD states she quit breathing (2016); hard to wake up   ESRD (end stage renal disease) ([place])    dialysis Tues, Thurs, Sat [address], sees dr deterding    GERD (gastroesophageal reflux disease)    Glaucoma    both eyes   H/O hiatal hernia    a. CT 2017: large gastric hiatal hernia.   History of blood transfusion 04/13/2015   History of kidney stones    10/18/2019: per patient "has a couple currently one in each kidney"   History of nephrostomy 04/11/2015   currently inplace  04/28/2015  removed now   Hyperlipidemia    Hypertension    medication removed from regimen due to low blood pressure    Iron deficiency    hx   Nephrolithiasis 2002, 2006   bilateral   PAF (paroxysmal atrial fibrillation) ([place])    a. 10/ 2013  in setting of Septic Shock //  b. recurrent during admit for pneumonia, L effusion >> placed on Amiodarone // Coumadin for anticoagulation   Primary localized osteoarthritis of right hip 10/22/2019   S/P hemodialysis catheter insertion ([place]) 04/11/2015   right anterior chest , only used once    Sigmoid diverticulosis     Patient Active Problem List   Diagnosis Date Noted   Chronic diarrhea 01/05/2021   Aortic stenosis 01/05/2021   Physical deconditioning 12/31/2020   Chronic diastolic heart failure ([place]) 12/11/2020   Anxiety 12/11/2020   Malignant hypertensive chronic kidney disease 12/11/2020   Nonrheumatic aortic (valve) stenosis 12/11/2020   Obesity 12/11/2020   Pulmonary hypertension ([place]) 12/11/2020   Current mild episode of major depressive disorder (HCC)    Chronic UTI    Loose stools    ESRD on dialysis ([place])    Acute on chronic anemia    Post-operative pain    Debility 11/03/2020   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   Compartment syndrome ([place]) 10/27/2020   Stress fracture,  hip, unspecified, initial encounter for fracture 06/25/2020   Hip fracture ([place]) 06/07/2020   Acute hypoxemic respiratory failure ([place]) 06/07/2020   Atrial fibrillation ([place]) 06/07/2020   Gout, unspecified 11/14/2019   Primary localized osteoarthritis of left knee 10/22/2019   S/P TKR (total knee replacement), left 10/22/2019   Allergy, unspecified, initial encounter 04/08/2019   Gastroenteritis due to norovirus    Hiatal hernia    Melena    UGI bleed 08/23/2018   Hypokalemia 08/23/2018   Hyperlipidemia 08/23/2018   GERD (gastroesophageal reflux disease) 08/23/2018   Personal history of Methicillin resistant Staphylococcus aureus infection  05/02/2018   Methicillin resistant Staphylococcus aureus infection, unspecified site 03/22/2018   Hypothyroidism, unspecified 03/09/2017   CAP (community acquired pneumonia) 07/02/2016   Dehydration    PNA (pneumonia) 05/10/2016   Pleural effusion on left    Pleurisy    Hypoxemia    HCAP (healthcare-associated pneumonia) 11/21/2015   S/P thoracentesis    Chest pain 11/20/2015   Pleural effusion, left 11/20/2015   Pericardial effusion    Pleural effusion    Encounter for therapeutic drug monitoring 11/13/2015   Anemia in chronic kidney disease 11/06/2015   Iron deficiency anemia, unspecified 11/06/2015   Secondary hyperparathyroidism of renal origin ([place]) 11/06/2015   Shortness of breath 11/06/2015   Atrial fibrillation with RVR (HCC)    PAF (paroxysmal atrial fibrillation) ([place]) 11/05/2015   Severe protein-calorie malnutrition ([place]) 05/27/2015   Normocytic anemia 11/23/2014   Essential hypertension, benign 08/14/2013   Glaucoma     Past Surgical History:  Procedure Laterality Date   AV FISTULA PLACEMENT Left 06/02/2015   Procedure: BRACHIOCEPHALIC ARTERIOVENOUS (AV) FISTULA CREATION ;  Surgeon: Conrad Dorrington, MD;  Location: [facility];  Service: Vascular;  Laterality: Left;   [place] Left 07/27/2015   Procedure: FIRST STAGE BASILIC VEIN TRANSPOSITION LEFT UPPER ARM;  Surgeon: Conrad Marsing, MD;  Location: [facility];  Service: Vascular;  Laterality: Left;   [place] Left 09/2015   second phase   [place] Left 10/12/2015   Procedure: SECOND STAGE BASILIC VEIN TRANSPOSITION LEFT ARM;  Surgeon: Conrad [place], MD;  Location: [facility];  Service: Vascular;  Laterality: Left;   BREAST BIOPSY Left 08/23/07   benign fibrocystic with duct ectasia   CARDIAC CATHETERIZATION  07-11-2012  dr Irish Lack   Abnormal stress test/   normal coronary arteries/  LVEDP  48mmHg   CARDIOVASCULAR STRESS TEST  06-26-2012  dr Irish Lack   marked ischemia in the basal  anterior, mid anterior, apical inferior regions/  normal LVF, ef 63%   CATARACT EXTRACTION W/ INTRAOCULAR LENS  IMPLANT, BILATERAL     COLONOSCOPY WITH PROPOFOL N/A 10/17/2016   Procedure: COLONOSCOPY WITH PROPOFOL;  Surgeon: Garlan Fair, MD;  Location: [facility];  Service: Endoscopy;  Laterality: N/A;   CYSTOSCOPY W/ URETERAL STENT PLACEMENT  04/04/2012   Procedure: CYSTOSCOPY WITH RETROGRADE PYELOGRAM/URETERAL STENT PLACEMENT;  Surgeon: Ailene Rud, MD;  Location: [facility];  Service: Urology;  Laterality: Left;   CYSTOSCOPY W/ URETERAL STENT PLACEMENT Bilateral 05/04/2015   Procedure: CYSTOSCOPY WITH BILATERAL RETROGRADE PYELOGRAM/ WITH INTERPRETATION, EXCHANGE OF RIGHT URETERAL STENT REPLACEMENT AND PLACEMENT LEFT URETERAL STENT PLACEMENT EXAMINATION OF VAGINA;  Surgeon: Carolan Clines, MD;  Location: [facility];  Service: Urology;  Laterality: Bilateral;   CYSTOSCOPY WITH STENT PLACEMENT Right 10/28/2014   Procedure: RIGHT URETERAL STENT PLACEMENT;  Surgeon: Irine Seal, MD;  [facility];  Service: Urology;  Laterality: Right;  CYSTOSCOPY WITH STENT PLACEMENT Right 02/26/2015   Procedure: CYSTOSCOPY RETROGRADE PYELOGRAM WITH STENT PLACEMENT;  Surgeon: Cleon Gustin, MD;  Location: [facility];  Service: Urology;  Laterality: Right;   CYSTOSCOPY/RETROGRADE/URETEROSCOPY/STONE EXTRACTION WITH BASKET Right 11/21/2014   Procedure: CYSTOSCOPY/RIGHT RETROGRADE PYELOGRAM/RIGHT URETEROSCOPY/BASKET EXTRACTION/RIGHT PYELOSCOPY/LASER OF STONE/RIGHT DOUBLE J STENT;  Surgeon: Carolan Clines, MD;  Location: [facility];  Service: Urology;  Laterality: Right;   ESOPHAGOGASTRODUODENOSCOPY (EGD) WITH PROPOFOL Left 08/25/2018   Procedure: ESOPHAGOGASTRODUODENOSCOPY (EGD) WITH PROPOFOL;  Surgeon: Ronald Lobo, MD;  Location: [facility];  Service: Endoscopy;  Laterality: Left;   EXTRACORPOREAL SHOCK WAVE LITHOTRIPSY  05-28-2012  &  10-08-2012   HEMATOMA  EVACUATION Left 10/27/2020   Procedure: EVACUATION HEMATOMA, repair of Pseudoaneurysm;  Surgeon: Waynetta Sandy, MD;  Location: [facility];  Service: Vascular;  Laterality: Left;   HIP ARTHROPLASTY Left 06/09/2020   Procedure: ARTHROPLASTY BIPOLAR HIP (HEMIARTHROPLASTY);  Surgeon: Marchia Bond, MD;  Location: [facility];  Service: Orthopedics;  Laterality: Left;   HOLMIUM LASER APPLICATION Right 0/06/6008   Procedure: HOLMIUM LASER APPLICATION;  Surgeon: Carolan Clines, MD;  Location: [facility];  Service: Urology;  Laterality: Right;   IR GENERIC HISTORICAL  02/01/2016   IR NEPHROSTOMY EXCHANGE RIGHT 02/01/2016 Greggory Keen, MD [facility]   IR GENERIC HISTORICAL  02/24/2016   IR PATIENT EVAL TECH 0-60 MINS 02/24/2016 Aletta Edouard, MD [facility]   KNEE ARTHROSCOPY Left 02-14-2003   LAPAROSCOPIC CHOLECYSTECTOMY  03-23-2005   TOTAL ABDOMINAL HYSTERECTOMY W/ BILATERAL SALPINGOOPHORECTOMY  1993   secondary to fibroids   TOTAL KNEE ARTHROPLASTY Left 10/22/2019   TOTAL KNEE ARTHROPLASTY Left 10/22/2019   Procedure: TOTAL KNEE ARTHROPLASTY;  Surgeon: Marchia Bond, MD;  Location: [facility];  Service: Orthopedics;  Laterality: Left;   TRANSTHORACIC ECHOCARDIOGRAM  04-09-2012   normal LVF,  ef 60-65%,  mild LAE,  mild TR, trivial MR and PR     OB History     Gravida  1   Para  1   Term  1   Preterm  0   AB  0   Living  1      SAB  0   IAB  0   Ectopic  0   Multiple      Live Births  1           Family History  Problem Relation Age of Onset   Hypertension Mother    Cancer Mother 66       breast   Dementia Mother    Hypertension Brother    Diabetes Brother    Heart disease Brother        before age 75   Cancer Father 59       pancreatic   Heart failure Paternal Grandmother    Bladder Cancer Maternal Grandfather     Social History   Tobacco Use   Smoking status: Never   Smokeless tobacco: Never  Vaping Use   Vaping Use: Never used   Substance Use Topics   Alcohol use: No    Alcohol/week: 0.0 standard drinks   Drug use: No    Home Medications Prior to Admission medications   Medication Sig Start Date End Date Taking? Authorizing Provider  amiodarone (PACERONE) 200 MG tablet Take 1 tablet (200 mg total) by mouth daily. 08/24/20   Jettie Booze, MD  apixaban (ELIQUIS) 5 MG TABS tablet Take 1 tablet (5 mg total) by mouth 2 (two) times daily. 01/09/21   Girguis,  Shanon Brow, MD  colestipol (COLESTID) 1 g tablet Take 2 tablets (2 g total) by mouth at bedtime. 01/09/21   Dwyane Dee, MD  gabapentin (NEURONTIN) 600 MG tablet Take 0.5 tablets (300 mg total) by mouth at bedtime. 12/02/20   Love, Ivan Anchors, PA-C  heparin 1000 unit/mL SOLN injection Heparin Sodium (Porcine) 1,000 Units/mL Catheter Lock Arterial 10/24/20 10/23/21  [provider]  multivitamin (RENA-VIT) TABS tablet Take 1 tablet by mouth at bedtime. 07/27/19   [provider]  polycarbophil (FIBERCON) 625 MG tablet Take 1 tablet (625 mg total) by mouth 2 (two) times daily before lunch and supper. 12/02/20   Love, Ivan Anchors, PA-C  saccharomyces boulardii (FLORASTOR) 250 MG capsule Take 1 capsule (250 mg total) by mouth 2 (two) times daily. 07/08/15   Donne Hazel, MD    Allergies    Astemizole, Fluorouracil, Vicodin [hydrocodone-acetaminophen], Chlorhexidine, and Percocet [oxycodone-acetaminophen]  Review of Systems   Review of Systems  All other systems reviewed and are negative. Ten systems reviewed and are negative for acute change, except as noted in the HPI.   Physical Exam Updated Vital Signs BP (!) 136/50   Pulse 72   Temp 98 F (36.7 C) (Oral)   Resp (!) 25   LMP 01/19/1992 (Approximate)   SpO2 96%   Physical Exam Vitals and nursing note reviewed.  Constitutional:      General: She is not in acute distress.    Appearance: Normal appearance. She is not ill-appearing, toxic-appearing or diaphoretic.     Comments: Cachectic elderly  adult female.  Lying in the semi-Fowler's position.  Appears fatigued.  Answering questions and following commands.  HENT:     Head: Normocephalic and atraumatic.     Right Ear: External ear normal.     Left Ear: External ear normal.     Nose: Nose normal.     Mouth/Throat:     Mouth: Mucous membranes are moist.     Pharynx: Oropharynx is clear. No oropharyngeal exudate or posterior oropharyngeal erythema.  Eyes:     General: No scleral icterus.       Right eye: No discharge.        Left eye: No discharge.     Extraocular Movements: Extraocular movements intact.     Conjunctiva/sclera: Conjunctivae normal.  Cardiovascular:     Rate and Rhythm: Normal rate and regular rhythm.     Pulses: Normal pulses.     Heart sounds: Murmur heard.    No friction rub. No gallop.     Comments: AV fistula noted in the left upper extremity with good thrill. Pulmonary:     Effort: Pulmonary effort is normal. No respiratory distress.     Breath sounds: Normal breath sounds. No stridor. No wheezing, rhonchi or rales.  Abdominal:     General: Abdomen is flat.     Tenderness: There is no abdominal tenderness.  Genitourinary:    Comments: Female nursing chaperone present.  Small nonthrombosed external hemorrhoid noted.  No tenderness at the site.  No palpable internal hemorrhoids.  Moderate amount of soft brown stool noted in the rectal vault.  No hematochezia or melena.  No tenderness appreciated throughout the exam. Musculoskeletal:        General: Normal range of motion.     Cervical back: Normal range of motion and neck supple. No tenderness.     Right lower leg: Edema present.     Left lower leg: Edema present.     Comments: 1+  pitting edema noted in the bilateral lower extremities.  Skin:    General: Skin is warm and dry.  Neurological:     General: No focal deficit present.     Mental Status: She is alert and oriented to person, place, and time.  Psychiatric:        Mood and Affect: Mood  normal.        Behavior: Behavior normal.   ED Results / Procedures / Treatments   Labs (all labs ordered are listed, but only abnormal results are displayed) Labs Reviewed  COMPREHENSIVE METABOLIC PANEL - Abnormal; Notable for the following components:      Result Value   Creatinine, Ser 2.11 (*)    Calcium 7.7 (*)    Total Protein 4.2 (*)    Albumin 1.8 (*)    GFR, Estimated 24 (*)    All other components within normal limits  CBC WITH DIFFERENTIAL/PLATELET - Abnormal; Notable for the following components:   WBC 11.8 (*)    RBC 2.94 (*)    Hemoglobin 8.8 (*)    HCT 28.7 (*)    RDW 20.8 (*)    Neutro Abs 10.2 (*)    All other components within normal limits  POC OCCULT BLOOD, ED - Abnormal; Notable for the following components:   Fecal Occult Bld POSITIVE (*)    All other components within normal limits  RESP PANEL BY RT-PCR (FLU A&B, COVID) ARPGX2  LIPASE, BLOOD  TYPE AND SCREEN   EKG None  Radiology DG Chest Portable 1 View  Result Date: 02/25/2021 CLINICAL DATA:  Fatigue, weakness EXAM: PORTABLE CHEST 1 VIEW COMPARISON:  12/31/2020 FINDINGS: Right dialysis catheter remains in place, unchanged. Cardiomegaly with vascular congestion. Perihilar and lower lobe airspace disease, likely edema. Suspect layering effusions. IMPRESSION: Cardiomegaly with mild CHF.  Suspect layering effusions. Electronically Signed   By: Rolm Baptise M.D.   On: 02/25/2021 19:34    Procedures Procedures   Medications Ordered in ED Medications - No data to display  ED Course  I have reviewed the triage vital signs and the nursing notes.  Pertinent labs & imaging results that were available during my care of the patient were reviewed by me and considered in my medical decision making (see chart for details).  Clinical Course as of 02/26/21 0007  Thu Feb 25, 2021  2339 CBC with Differential(!) Hemoglobin appears to be similar to patient's baseline today at 8.8.  White blood cells elevated 11.8  with neutrophils of 10.2.  Again, similar to patient's baseline values. [LJ]    Clinical Course User Index [LJ] Rayna Sexton, PA-C   MDM Rules/Calculators/A&P                          Pt is a 75 y.o. female with a complex medical history who presents to the emergency department due to worsening weakness as well as hematochezia.  Labs: Guaiac is positive. Respiratory panel is negative. CBC with white blood cells of 11.8, hemoglobin of 8.8, neutrophils of 10.2. CMP with a creatinine of 2.11, calcium of 7.7, total protein of 4.2, albumin of 1.8, GFR of 24.  Patient is a hemodialysis patient was last dialyzed this morning. Lipase of 17.  Imaging: Chest x-ray shows cardiomegaly with mild CHF.  Suspect layering effusions.  I, Rayna Sexton, PA-C, personally reviewed and evaluated these images and lab results as part of my medical decision-making.  Guaiac is positive though no hematochezia or melena was  noted on my exam.  Moderate amount of soft brown stool noted.  Patient is anticoagulated on Eliquis for PAF.  She was admitted in July of this year and had similar symptoms.  She had a negative GI pathogen panel.  C. difficile testing was consistent with "ongoing colonization but not true infection."  Her hemoglobin was stable during the admission and her symptoms resolved spontaneously.  Respiratory panel today is negative.  I spoke to Dr. Alessandra Bevels with GI.  Request we admit patient to the medicine team.  States that they will consult and likely perform a colonoscopy during the patient's admission.  Request patient stay on a clear liquid diet.  Will discuss with the medicine team.  Note: Portions of this report may have been transcribed using voice recognition software. Every effort was made to ensure accuracy; however, inadvertent computerized transcription errors may be present.   Final Clinical Impression(s) / ED Diagnoses Final diagnoses:  Weakness  Rectal bleeding  Symptomatic  anemia   Rx / DC Orders ED Discharge Orders     None        Rayna Sexton, PA-C 02/26/21 0022    Jeanell Sparrow, DO 02/26/21 0036

## 2021-02-25 NOTE — ED Triage Notes (Signed)
BIBA Per EMS:  Pt coming from home with c/o diarrhea x a few months, increased weakness x 2 days, blood in stool, and possible UTI. Pt takes eliquis. Dialysis pt- had dialysis today.  90% RA - 2L [place]- 95%  160/68 72 HR  124CBG

## 2021-02-25 NOTE — ED Notes (Signed)
Labs collected and sent. Daughter at bedside.

## 2021-02-26 ENCOUNTER — Other Ambulatory Visit (HOSPITAL_COMMUNITY): Payer: Self-pay

## 2021-02-26 ENCOUNTER — Encounter (HOSPITAL_COMMUNITY): Payer: Self-pay | Admitting: Internal Medicine

## 2021-02-26 ENCOUNTER — Encounter (HOSPITAL_COMMUNITY): Payer: Self-pay | Admitting: *Deleted

## 2021-02-26 DIAGNOSIS — H409 Unspecified glaucoma: Secondary | ICD-10-CM | POA: Diagnosis present

## 2021-02-26 DIAGNOSIS — N186 End stage renal disease: Secondary | ICD-10-CM | POA: Diagnosis present

## 2021-02-26 DIAGNOSIS — Z96652 Presence of left artificial knee joint: Secondary | ICD-10-CM | POA: Diagnosis present

## 2021-02-26 DIAGNOSIS — E43 Unspecified severe protein-calorie malnutrition: Secondary | ICD-10-CM | POA: Diagnosis present

## 2021-02-26 DIAGNOSIS — I132 Hypertensive heart and chronic kidney disease with heart failure and with stage 5 chronic kidney disease, or end stage renal disease: Secondary | ICD-10-CM | POA: Diagnosis present

## 2021-02-26 DIAGNOSIS — A0471 Enterocolitis due to Clostridium difficile, recurrent: Secondary | ICD-10-CM | POA: Diagnosis present

## 2021-02-26 DIAGNOSIS — I480 Paroxysmal atrial fibrillation: Secondary | ICD-10-CM | POA: Diagnosis present

## 2021-02-26 DIAGNOSIS — I5032 Chronic diastolic (congestive) heart failure: Secondary | ICD-10-CM

## 2021-02-26 DIAGNOSIS — Z885 Allergy status to narcotic agent status: Secondary | ICD-10-CM | POA: Diagnosis not present

## 2021-02-26 DIAGNOSIS — I429 Cardiomyopathy, unspecified: Secondary | ICD-10-CM | POA: Diagnosis present

## 2021-02-26 DIAGNOSIS — I350 Nonrheumatic aortic (valve) stenosis: Secondary | ICD-10-CM | POA: Diagnosis present

## 2021-02-26 DIAGNOSIS — K922 Gastrointestinal hemorrhage, unspecified: Secondary | ICD-10-CM | POA: Diagnosis not present

## 2021-02-26 DIAGNOSIS — Z8249 Family history of ischemic heart disease and other diseases of the circulatory system: Secondary | ICD-10-CM | POA: Diagnosis not present

## 2021-02-26 DIAGNOSIS — Z888 Allergy status to other drugs, medicaments and biological substances status: Secondary | ICD-10-CM | POA: Diagnosis not present

## 2021-02-26 DIAGNOSIS — Z87440 Personal history of urinary (tract) infections: Secondary | ICD-10-CM | POA: Diagnosis not present

## 2021-02-26 DIAGNOSIS — K529 Noninfective gastroenteritis and colitis, unspecified: Secondary | ICD-10-CM | POA: Diagnosis not present

## 2021-02-26 DIAGNOSIS — Z20822 Contact with and (suspected) exposure to covid-19: Secondary | ICD-10-CM | POA: Diagnosis present

## 2021-02-26 DIAGNOSIS — Z992 Dependence on renal dialysis: Secondary | ICD-10-CM | POA: Diagnosis not present

## 2021-02-26 DIAGNOSIS — Z79899 Other long term (current) drug therapy: Secondary | ICD-10-CM | POA: Diagnosis not present

## 2021-02-26 DIAGNOSIS — E785 Hyperlipidemia, unspecified: Secondary | ICD-10-CM | POA: Diagnosis present

## 2021-02-26 DIAGNOSIS — Z7901 Long term (current) use of anticoagulants: Secondary | ICD-10-CM | POA: Diagnosis not present

## 2021-02-26 DIAGNOSIS — R531 Weakness: Secondary | ICD-10-CM | POA: Diagnosis not present

## 2021-02-26 DIAGNOSIS — K219 Gastro-esophageal reflux disease without esophagitis: Secondary | ICD-10-CM | POA: Diagnosis present

## 2021-02-26 DIAGNOSIS — Z96642 Presence of left artificial hip joint: Secondary | ICD-10-CM | POA: Diagnosis present

## 2021-02-26 DIAGNOSIS — Z6826 Body mass index (BMI) 26.0-26.9, adult: Secondary | ICD-10-CM | POA: Diagnosis not present

## 2021-02-26 DIAGNOSIS — K625 Hemorrhage of anus and rectum: Secondary | ICD-10-CM | POA: Diagnosis present

## 2021-02-26 DIAGNOSIS — D631 Anemia in chronic kidney disease: Secondary | ICD-10-CM | POA: Diagnosis present

## 2021-02-26 HISTORY — DX: Gastrointestinal hemorrhage, unspecified: K92.2

## 2021-02-26 LAB — COMPREHENSIVE METABOLIC PANEL
ALT: 15 U/L (ref 0–44)
AST: 19 U/L (ref 15–41)
Albumin: 1.7 g/dL — ABNORMAL LOW (ref 3.5–5.0)
Alkaline Phosphatase: 93 U/L (ref 38–126)
Anion gap: 7 (ref 5–15)
BUN: 9 mg/dL (ref 8–23)
CO2: 31 mmol/L (ref 22–32)
Calcium: 7.7 mg/dL — ABNORMAL LOW (ref 8.9–10.3)
Chloride: 98 mmol/L (ref 98–111)
Creatinine, Ser: 2.27 mg/dL — ABNORMAL HIGH (ref 0.44–1.00)
GFR, Estimated: 22 mL/min — ABNORMAL LOW (ref >60)
Glucose, Bld: 85 mg/dL (ref 70–99)
Potassium: 3.8 mmol/L (ref 3.5–5.1)
Sodium: 136 mmol/L (ref 135–145)
Total Bilirubin: 1 mg/dL (ref 0.3–1.2)
Total Protein: 4.1 g/dL — ABNORMAL LOW (ref 6.5–8.1)

## 2021-02-26 LAB — PROTIME-INR
INR: 3.2 — ABNORMAL HIGH (ref 0.8–1.2)
Prothrombin Time: 32.5 seconds — ABNORMAL HIGH (ref 11.4–15.2)

## 2021-02-26 LAB — APTT: aPTT: 59 seconds — ABNORMAL HIGH (ref 24–36)

## 2021-02-26 LAB — CBC
HCT: 27.1 % — ABNORMAL LOW (ref 36.0–46.0)
Hemoglobin: 8.3 g/dL — ABNORMAL LOW (ref 12.0–15.0)
MCH: 30 pg (ref 26.0–34.0)
MCHC: 30.6 g/dL (ref 30.0–36.0)
MCV: 97.8 fL (ref 80.0–100.0)
Platelets: 278 10*3/uL (ref 150–400)
RBC: 2.77 MIL/uL — ABNORMAL LOW (ref 3.87–5.11)
RDW: 21.2 % — ABNORMAL HIGH (ref 11.5–15.5)
WBC: 13.4 10*3/uL — ABNORMAL HIGH (ref 4.0–10.5)
nRBC: 0 % (ref 0.0–0.2)

## 2021-02-26 LAB — C DIFFICILE QUICK SCREEN W PCR REFLEX
C Diff antigen: POSITIVE — AB
C Diff interpretation: DETECTED
C Diff toxin: POSITIVE — AB

## 2021-02-26 LAB — TYPE AND SCREEN
ABO/RH(D): O POS
Antibody Screen: NEGATIVE

## 2021-02-26 LAB — LACTOFERRIN, FECAL, QUALITATIVE: Lactoferrin, Fecal, Qual: POSITIVE — AB

## 2021-02-26 MED ORDER — SODIUM CHLORIDE 0.9 % IV SOLN
62.5000 mg | INTRAVENOUS | Status: DC
  Administered 2021-02-27 – 2021-03-02 (×2): 62.5 mg via INTRAVENOUS
  Filled 2021-02-26 (×4): qty 5

## 2021-02-26 MED ORDER — ACETAMINOPHEN 650 MG RE SUPP: 650.0000 mg | Freq: Four times a day (QID) | RECTAL | Status: DC | PRN

## 2021-02-26 MED ORDER — ONDANSETRON HCL 4 MG/2ML IJ SOLN
INTRAMUSCULAR | Status: AC
  Administered 2021-02-26: 4 mg via INTRAVENOUS
  Filled 2021-02-26: qty 2

## 2021-02-26 MED ORDER — ASPIRIN EC 81 MG PO TBEC: 81.0000 mg | DELAYED_RELEASE_TABLET | Freq: Every day | ORAL | Status: DC

## 2021-02-26 MED ORDER — AMIODARONE HCL 200 MG PO TABS
200.0000 mg | ORAL_TABLET | Freq: Every day | ORAL | Status: DC
  Administered 2021-02-26 – 2021-03-03 (×6): 200 mg via ORAL
  Filled 2021-02-26 (×6): qty 1

## 2021-02-26 MED ORDER — COLESTIPOL HCL 1 G PO TABS: 2.0000 g | ORAL_TABLET | Freq: Every day | ORAL | Status: DC

## 2021-02-26 MED ORDER — LOPERAMIDE HCL 2 MG PO CAPS: 2.0000 mg | ORAL_CAPSULE | ORAL | Status: DC | PRN

## 2021-02-26 MED ORDER — ONDANSETRON HCL 4 MG/2ML IJ SOLN
4.0000 mg | Freq: Four times a day (QID) | INTRAMUSCULAR | Status: DC | PRN
  Administered 2021-02-26 – 2021-03-02 (×4): 4 mg via INTRAVENOUS
  Filled 2021-02-26 (×3): qty 2

## 2021-02-26 MED ORDER — VANCOMYCIN HCL 125 MG PO CAPS: 125.0000 mg | ORAL_CAPSULE | Freq: Two times a day (BID) | ORAL | Status: DC

## 2021-02-26 MED ORDER — SACCHAROMYCES BOULARDII 250 MG PO CAPS
250.0000 mg | ORAL_CAPSULE | Freq: Two times a day (BID) | ORAL | Status: DC
  Administered 2021-02-26: 250 mg via ORAL
  Filled 2021-02-26: qty 1

## 2021-02-26 MED ORDER — VANCOMYCIN HCL 125 MG PO CAPS
125.0000 mg | ORAL_CAPSULE | Freq: Four times a day (QID) | ORAL | Status: DC
  Filled 2021-02-26 (×2): qty 1

## 2021-02-26 MED ORDER — VANCOMYCIN HCL 125 MG PO CAPS: 125.0000 mg | ORAL_CAPSULE | ORAL | Status: DC

## 2021-02-26 MED ORDER — CHLORHEXIDINE GLUCONATE CLOTH 2 % EX PADS: 6.0000 | MEDICATED_PAD | Freq: Every day | CUTANEOUS | Status: DC

## 2021-02-26 MED ORDER — SODIUM CHLORIDE 0.9 % IV SOLN: INTRAVENOUS | Status: DC

## 2021-02-26 MED ORDER — GABAPENTIN 300 MG PO CAPS
300.0000 mg | ORAL_CAPSULE | Freq: Every day | ORAL | Status: DC
  Administered 2021-02-26 – 2021-03-02 (×5): 300 mg via ORAL
  Filled 2021-02-26 (×5): qty 1

## 2021-02-26 MED ORDER — ACETAMINOPHEN 325 MG PO TABS
650.0000 mg | ORAL_TABLET | Freq: Four times a day (QID) | ORAL | Status: DC | PRN
  Administered 2021-02-27 – 2021-03-03 (×9): 650 mg via ORAL
  Filled 2021-02-26 (×9): qty 2

## 2021-02-26 MED ORDER — FIDAXOMICIN 200 MG PO TABS
200.0000 mg | ORAL_TABLET | Freq: Two times a day (BID) | ORAL | Status: DC
  Administered 2021-02-26 – 2021-03-03 (×10): 200 mg via ORAL
  Filled 2021-02-26 (×13): qty 1

## 2021-02-26 MED ORDER — FIDAXOMICIN 200 MG PO TABS
200.0000 mg | ORAL_TABLET | Freq: Two times a day (BID) | ORAL | 0 refills | Status: DC
  Filled 2021-02-26: qty 20, 10d supply, fill #0

## 2021-02-26 MED ORDER — PEG 3350-KCL-NA BICARB-NACL 420 G PO SOLR
4000.0000 mL | Freq: Once | ORAL | Status: DC
  Filled 2021-02-26: qty 4000

## 2021-02-26 MED ORDER — VANCOMYCIN HCL 125 MG PO CAPS: 125.0000 mg | ORAL_CAPSULE | Freq: Every day | ORAL | Status: DC

## 2021-02-26 NOTE — Progress Notes (Signed)
Pharmacy note - Dificid transitions of care  Dificid can be difficult to obtain on an outpatient basis due to cost and pharmacy availability.  I took the liberty of filling a 10 days supply for her as her copay with insurance and patient assistance is $0.  As she is being transferred to [facility], this medication is being stored in the inpatient pharmacy there for the patient to take with her at discharge.  Heide Guile, PharmD, BCPS-AQ ID Clinical Pharmacist Phone [phone]

## 2021-02-26 NOTE — ED Notes (Addendum)
Per Salley Slaughter, PA-C, holding NuLytely pending infectious disease consult.

## 2021-02-26 NOTE — ED Notes (Signed)
Pt changed and peri care provided.

## 2021-02-26 NOTE — ED Notes (Signed)
Attempted to give report at [facility] but unable to. Will call back in 10 mins per their request

## 2021-02-26 NOTE — ED Notes (Signed)
Pt readjusted in ED stretcher. New brief and linens changed.

## 2021-02-26 NOTE — ED Notes (Signed)
Hospitalist at the bedside 

## 2021-02-26 NOTE — ED Notes (Signed)
Dinner tray given

## 2021-02-26 NOTE — H&P (Signed)
History and Physical    [name] GMW:[id number] DOB: [birth year] DOA: 02/25/2021  PCP: Lajean Manes, MD  Patient coming from: Home via EMS   Chief Complaint: Diarrhea, blood in stool    HPI:    75 year old female with past medical history of end-stage renal disease (HD tues, thurs, sat), severe aortic stenosis, diastolic congestive heart failure (Echo 09/2020 EF 55-60% with G2DD), C. difficile colonization, paroxysmal atrial fibrillation (on eliquis), frequent urinary tract infections on suppression therapy with Keflex with recent ESBL Proteus UTI  (12/2020), anemia of chronic disease, gastroesophageal reflux disease who presents to [facility] emergency department due to progressively worsening diarrhea and weakness.  History has been obtained both from the patient as well as the daughter via phone conversation.  Patient has been experiencing essentially daily diarrhea since late December of last year when the patient was diagnosed with C. difficile colitis.  The daughter reports that ever since that diagnosis patient has continued to experience several episodes of loose to watery diarrhea daily.  Over the past 2 weeks his chronic diarrhea has dramatically worsened, occurring in excess of 5 times daily.  This is been associated with an increase in generalized weakness and poor appetite.  Patient denies any associated abdominal pain or fever.  Patient admits to frequent nausea but denies vomiting.  Patient denies recent ingestion of undercooked food, recent travel or confirmed contact with COVID-19 infection.  Patient was recently hospitalized in July and at that time stool studies were performed revealing equivocal C. difficile studies which were felt to be secondary to colonization.  During the hospitalization patient also exhibited gastrointestinal bleeding however this bleeding stopped spontaneously and was managed conservatively.  Daughter explains that severe diarrhea  continue to persist and approximately 5 days ago patient began to exhibit bright red blood mixed in the stools.  Of note, patient does take Eliquis for atrial fibrillation as well as daily baby aspirin.  Due to patient's ongoing severe diarrhea and bouts of blood mixed in stool the patient eventually presented to [facility] emergency department for evaluation.  Upon evaluation in the emergency department CBC revealed a hemoglobin of 8.8.  Case was discussed with Dr. Rosalie Gums with [facility].  Turns out patient was to undergo an endoscopic evaluation anyway and since patient has presented to the emergency department he recommends hospitalization on the hospitalist service with GI to consult tomorrow for possible endoscopic evaluation.  The hospitalist group is now been called to assess the patient for admission to the hospital.  Review of Systems:   Review of Systems  Constitutional:  Positive for malaise/fatigue.  Gastrointestinal:  Positive for diarrhea.  Neurological:  Positive for weakness.  All other systems reviewed and are negative.  Past Medical History:  Diagnosis Date   Arthritis of left knee    Cardiomyopathy ([place])    a. h/o LV dysfunction EF 20-25% in 2013 due to sepsis.>> improved to normal    Chronic diastolic CHF (congestive heart failure) ([place])    10/ 2013 in setting of septic shock   Complication of anesthesia    use a little anesthesia , per patient MD states she quit breathing (2016); hard to wake up   ESRD (end stage renal disease) ([place])    dialysis Tues, Thurs, Sat [address], sees dr deterding    GERD (gastroesophageal reflux disease)    Glaucoma    both eyes   H/O hiatal hernia    a. CT 2017: large gastric hiatal hernia.  History of blood transfusion 04/13/2015   History of kidney stones    10/18/2019: per patient "has a couple currently one in each kidney"   History of nephrostomy 04/11/2015   currently inplace 04/28/2015  removed now   Hyperlipidemia     Hypertension    medication removed from regimen due to low blood pressure    Iron deficiency    hx   Nephrolithiasis 2002, 2006   bilateral   PAF (paroxysmal atrial fibrillation) ([place])    a. 10/ 2013  in setting of Septic Shock //  b. recurrent during admit for pneumonia, L effusion >> placed on Amiodarone // Coumadin for anticoagulation   Primary localized osteoarthritis of right hip 10/22/2019   S/P hemodialysis catheter insertion ([place]) 04/11/2015   right anterior chest , only used once    Sigmoid diverticulosis     Past Surgical History:  Procedure Laterality Date   AV FISTULA PLACEMENT Left 06/02/2015   Procedure: BRACHIOCEPHALIC ARTERIOVENOUS (AV) FISTULA CREATION ;  Surgeon: Conrad Momeyer, MD;  Location: [place];  Service: Vascular;  Laterality: Left;   [place] Left 07/27/2015   Procedure: FIRST STAGE BASILIC VEIN TRANSPOSITION LEFT UPPER ARM;  Surgeon: Conrad [place], MD;  Location: [place];  Service: Vascular;  Laterality: Left;   [place] Left 09/2015   second phase   [place] Left 10/12/2015   Procedure: SECOND STAGE BASILIC VEIN TRANSPOSITION LEFT ARM;  Surgeon: Conrad , MD;  Location: [place];  Service: Vascular;  Laterality: Left;   BREAST BIOPSY Left 08/23/07   benign fibrocystic with duct ectasia   CARDIAC CATHETERIZATION  07-11-2012  dr Irish Lack   Abnormal stress test/   normal coronary arteries/  LVEDP  58mmHg   CARDIOVASCULAR STRESS TEST  06-26-2012  dr Irish Lack   marked ischemia in the basal anterior, mid anterior, apical inferior regions/  normal LVF, ef 63%   CATARACT EXTRACTION W/ INTRAOCULAR LENS  IMPLANT, BILATERAL     COLONOSCOPY WITH PROPOFOL N/A 10/17/2016   Procedure: COLONOSCOPY WITH PROPOFOL;  Surgeon: Garlan Fair, MD;  Location: [facility];  Service: Endoscopy;  Laterality: N/A;   CYSTOSCOPY W/ URETERAL STENT PLACEMENT  04/04/2012   Procedure: CYSTOSCOPY WITH RETROGRADE PYELOGRAM/URETERAL STENT  PLACEMENT;  Surgeon: Ailene Rud, MD;  Location: [place];  Service: Urology;  Laterality: Left;   CYSTOSCOPY W/ URETERAL STENT PLACEMENT Bilateral 05/04/2015   Procedure: CYSTOSCOPY WITH BILATERAL RETROGRADE PYELOGRAM/ WITH INTERPRETATION, EXCHANGE OF RIGHT URETERAL STENT REPLACEMENT AND PLACEMENT LEFT URETERAL STENT PLACEMENT EXAMINATION OF VAGINA;  Surgeon: Carolan Clines, MD;  Location: [facility];  Service: Urology;  Laterality: Bilateral;   CYSTOSCOPY WITH STENT PLACEMENT Right 10/28/2014   Procedure: RIGHT URETERAL STENT PLACEMENT;  Surgeon: Irine Seal, MD;  Location[facility];  Service: Urology;  Laterality: Right;   CYSTOSCOPY WITH STENT PLACEMENT Right 02/26/2015   Procedure: CYSTOSCOPY RETROGRADE PYELOGRAM WITH STENT PLACEMENT;  Surgeon: Cleon Gustin, MD;  [facility];  Service: Urology;  Laterality: Right;   CYSTOSCOPY/RETROGRADE/URETEROSCOPY/STONE EXTRACTION WITH BASKET Right 11/21/2014   Procedure: CYSTOSCOPY/RIGHT RETROGRADE PYELOGRAM/RIGHT URETEROSCOPY/BASKET EXTRACTION/RIGHT PYELOSCOPY/LASER OF STONE/RIGHT DOUBLE J STENT;  Surgeon: Carolan Clines, MD;  Location: [place];  Service: Urology;  Laterality: Right;   ESOPHAGOGASTRODUODENOSCOPY (EGD) WITH PROPOFOL Left 08/25/2018   Procedure: ESOPHAGOGASTRODUODENOSCOPY (EGD) WITH PROPOFOL;  Surgeon: Ronald Lobo, MD;  Location: [place];  Service: Endoscopy;  Laterality: Left;   EXTRACORPOREAL SHOCK WAVE LITHOTRIPSY  05-28-2012  &  10-08-2012  HEMATOMA EVACUATION Left 10/27/2020   Procedure: EVACUATION HEMATOMA, repair of Pseudoaneurysm;  Surgeon: Waynetta Sandy, MD;  Location: [place];  Service: Vascular;  Laterality: Left;   HIP ARTHROPLASTY Left 06/09/2020   Procedure: ARTHROPLASTY BIPOLAR HIP (HEMIARTHROPLASTY);  Surgeon: Marchia Bond, MD;  Location: [place];  Service: Orthopedics;  Laterality: Left;   HOLMIUM LASER APPLICATION Right 01/24/5642   Procedure: HOLMIUM LASER  APPLICATION;  Surgeon: Carolan Clines, MD;  Location[facility];  Service: Urology;  Laterality: Right;   IR GENERIC HISTORICAL  02/01/2016   IR NEPHROSTOMY EXCHANGE RIGHT 02/01/2016 Greggory Keen, MD [facility]   IR GENERIC HISTORICAL  02/24/2016   IR PATIENT EVAL TECH 0-60 MINS 02/24/2016 Aletta Edouard, MD [facility]   KNEE ARTHROSCOPY Left 02-14-2003   LAPAROSCOPIC CHOLECYSTECTOMY  03-23-2005   TOTAL ABDOMINAL HYSTERECTOMY W/ BILATERAL SALPINGOOPHORECTOMY  1993   secondary to fibroids   TOTAL KNEE ARTHROPLASTY Left 10/22/2019   TOTAL KNEE ARTHROPLASTY Left 10/22/2019   Procedure: TOTAL KNEE ARTHROPLASTY;  Surgeon: Marchia Bond, MD;  Location: [place];  Service: Orthopedics;  Laterality: Left;   TRANSTHORACIC ECHOCARDIOGRAM  04-09-2012   normal LVF,  ef 60-65%,  mild LAE,  mild TR, trivial MR and PR     reports that she has never smoked. She has never used smokeless tobacco. She reports that she does not drink alcohol and does not use drugs.  Allergies  Allergen Reactions   Astemizole Nausea And Vomiting   Fluorouracil Rash   Vicodin [Hydrocodone-Acetaminophen] Nausea And Vomiting   Chlorhexidine Rash    Sunburn    rash   Percocet [Oxycodone-Acetaminophen] Nausea And Vomiting    Family History  Problem Relation Age of Onset   Hypertension Mother    Cancer Mother 66       breast   Dementia Mother    Hypertension Brother    Diabetes Brother    Heart disease Brother        before age 31   Cancer Father 37       pancreatic   Heart failure Paternal Grandmother    Bladder Cancer Maternal Grandfather      Prior to Admission medications   Medication Sig Start Date End Date Taking? Authorizing Provider  amiodarone (PACERONE) 200 MG tablet Take 1 tablet (200 mg total) by mouth daily. 08/24/20  Yes Jettie Booze, MD  apixaban (ELIQUIS) 5 MG TABS tablet Take 1 tablet (5 mg total) by mouth 2 (two) times daily. 01/09/21  Yes Dwyane Dee, MD  colestipol  (COLESTID) 1 g tablet Take 2 tablets (2 g total) by mouth at bedtime. Patient taking differently: Take 2 g by mouth 2 (two) times daily. 01/09/21  Yes Dwyane Dee, MD  gabapentin (NEURONTIN) 600 MG tablet Take 0.5 tablets (300 mg total) by mouth at bedtime. 12/02/20  Yes Love, Ivan Anchors, PA-C  multivitamin (RENA-VIT) TABS tablet Take 1 tablet by mouth at bedtime. 07/27/19  Yes [provider]  Potassium Chloride ER 20 MEQ TBCR Take 1 tablet by mouth daily. 02/13/21  Yes [provider]  saccharomyces boulardii (FLORASTOR) 250 MG capsule Take 1 capsule (250 mg total) by mouth 2 (two) times daily. 07/08/15  Yes Donne Hazel, MD  VANCOMYCIN HCL PO Take 1 capsule by mouth every other day.   Yes [provider]  heparin 1000 unit/mL SOLN injection Heparin Sodium (Porcine) 1,000 Units/mL Catheter Lock Arterial 10/24/20 10/23/21  [provider]  polycarbophil (FIBERCON) 625 MG tablet Take 1 tablet (  625 mg total) by mouth 2 (two) times daily before lunch and supper. Patient not taking: Reported on 02/26/2021 12/02/20   Flora Lipps    Physical Exam: Vitals:   02/26/21 0030 02/26/21 0115 02/26/21 0200 02/26/21 0300  BP: (!) 114/46 (!) 127/45 (!) 110/42 (!) 148/61  Pulse: 71 71 71 67  Resp: 11 11 10 19   Temp:  98.1 F (36.7 C)    TempSrc:      SpO2: 94% 96% 94% 96%    Constitutional: Awake alert and oriented x3, no associated distress.   Skin: Multiple ecchymoses noted over the upper extremities.  Poor skin turgor in general.  Otherwise no significant rashes or lesions seen.  Eyes: Pupils are equally reactive to light.  No evidence of scleral icterus or conjunctival pallor.  ENMT: Moist mucous membranes noted.  Posterior pharynx clear of any exudate or lesions.   Neck: normal, supple, no masses, no thyromegaly.  No evidence of jugular venous distension.   Respiratory: Mild bibasilar rales, clear to auscultation bilaterally, no wheezing,  Normal respiratory  effort. No accessory muscle use.  Cardiovascular: Regular rate and rhythm, no murmurs / rubs / gallops.  Significant peripheral edema, worse in the bilateral lower extremities that tracks up to the thighs.  2+ pedal pulses. No carotid bruits.  Chest:   Nontender without crepitus or deformity.   Back:   Nontender without crepitus or deformity. Abdomen: Abdomen is soft and nontender.  No evidence of intra-abdominal masses.  Positive bowel sounds noted in all quadrants.   Musculoskeletal: No joint deformity upper and lower extremities. Good ROM, no contractures.  Poor muscle tone noted Neurologic: CN 2-12 grossly intact. Sensation intact.  Patient moving all 4 extremities spontaneously.  Patient is following all commands.  Patient is responsive to verbal stimuli.   Psychiatric: Patient exhibits normal mood with appropriate affect.  Patient seems to possess insight as to their current situation.     Labs on Admission: I have personally reviewed following labs and imaging studies -   CBC: Recent Labs  Lab 02/25/21 2253  WBC 11.8*  NEUTROABS 10.2*  HGB 8.8*  HCT 28.7*  MCV 97.6  PLT 161   Basic Metabolic Panel: Recent Labs  Lab 02/25/21 2253  NA 137  K 3.7  CL 98  CO2 31  GLUCOSE 81  BUN 8  CREATININE 2.11*  CALCIUM 7.7*   GFR: CrCl cannot be calculated (Unknown ideal weight.). Liver Function Tests: Recent Labs  Lab 02/25/21 2253  AST 21  ALT 16  ALKPHOS 97  BILITOT 0.9  PROT 4.2*  ALBUMIN 1.8*   Recent Labs  Lab 02/25/21 2253  LIPASE 17   No results for input(s): AMMONIA in the last 168 hours. Coagulation Profile: No results for input(s): INR, PROTIME in the last 168 hours. Cardiac Enzymes: No results for input(s): CKTOTAL, CKMB, CKMBINDEX, TROPONINI in the last 168 hours. BNP (last 3 results) No results for input(s): PROBNP in the last 8760 hours. HbA1C: No results for input(s): HGBA1C in the last 72 hours. CBG: No results for input(s): GLUCAP in the last  168 hours. Lipid Profile: No results for input(s): CHOL, HDL, LDLCALC, TRIG, CHOLHDL, LDLDIRECT in the last 72 hours. Thyroid Function Tests: No results for input(s): TSH, T4TOTAL, FREET4, T3FREE, THYROIDAB in the last 72 hours. Anemia Panel: No results for input(s): VITAMINB12, FOLATE, FERRITIN, TIBC, IRON, RETICCTPCT in the last 72 hours. Urine analysis:    Component Value Date/Time   COLORURINE YELLOW 12/31/2020 1840  APPEARANCEUR TURBID (A) 12/31/2020 1840   LABSPEC  12/31/2020 1840    TEST NOT REPORTED DUE TO COLOR INTERFERENCE OF URINE PIGMENT   PHURINE  12/31/2020 1840    TEST NOT REPORTED DUE TO COLOR INTERFERENCE OF URINE PIGMENT   GLUCOSEU (A) 12/31/2020 1840    TEST NOT REPORTED DUE TO COLOR INTERFERENCE OF URINE PIGMENT   HGBUR (A) 12/31/2020 1840    TEST NOT REPORTED DUE TO COLOR INTERFERENCE OF URINE PIGMENT   BILIRUBINUR (A) 12/31/2020 1840    TEST NOT REPORTED DUE TO COLOR INTERFERENCE OF URINE PIGMENT   BILIRUBINUR neg 09/18/2014 0920   KETONESUR (A) 12/31/2020 1840    TEST NOT REPORTED DUE TO COLOR INTERFERENCE OF URINE PIGMENT   PROTEINUR (A) 12/31/2020 1840    TEST NOT REPORTED DUE TO COLOR INTERFERENCE OF URINE PIGMENT   UROBILINOGEN 0.2 05/02/2015 2020   NITRITE (A) 12/31/2020 1840    TEST NOT REPORTED DUE TO COLOR INTERFERENCE OF URINE PIGMENT   LEUKOCYTESUR (A) 12/31/2020 1840    TEST NOT REPORTED DUE TO COLOR INTERFERENCE OF URINE PIGMENT    Radiological Exams on Admission - Personally Reviewed: DG Chest Portable 1 View  Result Date: 02/25/2021 CLINICAL DATA:  Fatigue, weakness EXAM: PORTABLE CHEST 1 VIEW COMPARISON:  12/31/2020 FINDINGS: Right dialysis catheter remains in place, unchanged. Cardiomegaly with vascular congestion. Perihilar and lower lobe airspace disease, likely edema. Suspect layering effusions. IMPRESSION: Cardiomegaly with mild CHF.  Suspect layering effusions. Electronically Signed   By: Rolm Baptise M.D.   On: 02/25/2021 19:34     EKG: Personally reviewed.  Rhythm is normal sinus rhythm with heart rate of 71 bpm.  Intraventricular conduction delay, no dynamic ST segment changes appreciated.  Assessment/Plan Principal Problem:   Acute lower GI bleeding  Patient presenting with frequent bouts of blood mixed with stool as the patient continues to experience frequent bouts of diarrhea This is likely secondary to mucosal irritation in the setting of ongoing anticoagulation Eliquis and aspirin therapy have been held, patient reports her last dose of Eliquis was the morning of 9/8 ER provider discussed case with Dr. Alessandra Bevels with [facility] gastroenterology who will proceed to evaluate patient in consultation the morning of 9/9 for consideration of endoscopic evaluation Serial CBCs Will transfuse if hemoglobin drops below 7. N.p.o. after midnight just in case although patient will require prep which means that the colonoscopy likely will not happen until 9/10.  Active Problems: Acute on chronic diarrhea  Patient presenting with 2-week history of acute on chronic diarrhea with frequent bouts of watery diarrhea beyond her baseline Patient has had chronic diarrhea since being diagnosed with C. difficile in late 2021 Most recent C. difficile testing in July was equivocal and it was felt that patient's frequent equivocal samples are likely due to ongoing C. difficile colonization Will repeat stool studies on this presentation Review of home medications does not reveal any medications that are likely exacerbating her presentation As needed loperamide Continuing Colestid Will await GI input, their assistance is appreciated.    Chronic diastolic CHF (congestive heart failure) (H[facility])  While patient does exhibit substantial peripheral edema I believe patient is actually intravascularly depleted or at least near euvolemia Peripheral edema most likely secondary to third spacing secondary to severe protein calorie malnutrition  considering markedly low albumin of 1.8    Severe protein-calorie malnutrition ([facility])  Many weeks of extremely poor oral intake with evidence of poor muscle tone on examination and concurrent low albumin of 1.8 all suggestive  of severe protein calorie malnutrition. Nutrition consultation placed, their input is appreciated.    AF (paroxysmal atrial fibrillation) ([place])  Patient is currently in normal sinus rhythm and rate controlled Holding home regimen of Eliquis Continue home regimen of amiodarone Monitoring patient on telemetry    ESRD on hemodialysis [facility])  Requesting bed at [facility] We will formally consult nephrology for resumption of dialysis services if patient remains in the hospital until Saturday Last hemodialysis session was on 9/8.    Generalized weakness  Severe generalized weakness secondary to deconditioning and poor oral intake with severe protein calorie malnutrition. Patient has had declining functional status over the past several months PT evaluation placed  Code Status:  Full code  code status decision has been confirmed with: patient Family Communication: daughter   Status is: Observation  The patient remains OBS appropriate and will d/c before 2 midnights.  Dispo: The patient is from: Home              Anticipated d/c is to: Home              Patient currently is not medically stable to d/c.   Difficult to place patient No        Vernelle Emerald MD [facility] Pager [phone]  If 7PM-7AM, please contact night-coverage [email or web address] Use universal [place] password for that web site. If you do not have the password, please call the hospital operator.  02/26/2021, 4:34 AM

## 2021-02-26 NOTE — ED Notes (Signed)
[facility] here to take pt to [facility]

## 2021-02-26 NOTE — TOC Benefit Eligibility Note (Signed)
Patient Advocate Encounter  Patient is approved through the Clostridium Difficile Associated Diarrhea Copay Assistance for [place] through [employer].      Lyndel Safe, [place] Patient Advocate Specialist [place] Antimicrobial Stewardship Team Direct Number: [phone]  Fax: [phone]

## 2021-02-26 NOTE — ED Notes (Signed)
Pt resting quietly with eyes closed. Attached to cardiac monitor x3. VSS at this time.

## 2021-02-26 NOTE — Consult Note (Addendum)
Referring Provider: ED Primary Care Physician:  Lajean Manes, MD Primary Gastroenterologist:  [facility]  Reason for Consultation:  Anemia, diarrhea, FOBT positive stool  HPI: [name] is a 75 y.o. female with past medical history of end-stage renal disease (HD tues, thurs, sat), severe aortic stenosis, diastolic congestive heart failure (Echo 09/2020 EF 55-60%), history of C. diff, paroxysmal A fib (on eliquis), and GERD presenting for consultation of anemia, diarrhea, and FOBT positive stool.  Patient presented due to severe diarrhea, weakness, and inability to tolerate PO intake.  She has been seen in the outpatient setting for ongoing severe diarrhea. C diff testing in 12/2020 was indeterminate (Antigen positive, Toxin negative), and she was started on Vancomycin. Extended dosing, given history of C diff, was initiated. Vancomycin 125 mg 4 times daily for 14 days, then 125 mg twice daily for 7 days, then 125 mg once daily for 7 days, then 125 mg every 2 or 3 days for 2 to 8 weeks.   She was started on Colestipol and also takes Imodium 4 times per day. However, she continues to have loose stools, at least 5-6 per day on this regimen, but sometimes up to 10 stools per day.  She has also noted some scant red blood in stool, denies melena.  Appetite is poor and she has been unable to tolerate PO intake over the last few days.  Has had nausea and one episode of non-bloody emesis. She has had poor appetite and lost a least 5 lbs in the past 1 month.  Family history pertinent for brother with colon cancer, diagnosed in his 25s.  Patient is on Eliquis, last dose 9/8. Denies aspirin or NSAID use.  EGD 08/2018: No bleeding or source of heme positivity seen on this exam. Non-obstructing moderate Schatzki ring. Large hiatal hernia, with angulation of the pouch making for a difficult exam of the distal stomach.  Colonoscopy 09/2016: one polyp in cecum - pathology showed only foreign vegetable material;  extensive left colonic diverticulosis  Colonoscopy 02/2010: diverticulosis, otherwise normal.  Past Medical History:  Diagnosis Date   Arthritis of left knee    Cardiomyopathy ([place])    a. h/o LV dysfunction EF 20-25% in 2013 due to sepsis.>> improved to normal    Chronic diastolic CHF (congestive heart failure) ([place])    10/ 2013 in setting of septic shock   Complication of anesthesia    use a little anesthesia , per patient MD states she quit breathing (2016); hard to wake up   ESRD (end stage renal disease) ([place])    dialysis Tues, Thurs, Sat [address], sees dr deterding    GERD (gastroesophageal reflux disease)    Glaucoma    both eyes   H/O hiatal hernia    a. CT 2017: large gastric hiatal hernia.   History of blood transfusion 04/13/2015   History of kidney stones    10/18/2019: per patient "has a couple currently one in each kidney"   History of nephrostomy 04/11/2015   currently inplace 04/28/2015  removed now   Hyperlipidemia    Hypertension    medication removed from regimen due to low blood pressure    Iron deficiency    hx   Nephrolithiasis 2002, 2006   bilateral   PAF (paroxysmal atrial fibrillation) ([place])    a. 10/ 2013  in setting of Septic Shock //  b. recurrent during admit for pneumonia, L effusion >> placed on Amiodarone // Coumadin for anticoagulation   Primary localized  osteoarthritis of right hip 10/22/2019   S/P hemodialysis catheter insertion ([place]) 04/11/2015   right anterior chest , only used once    Sigmoid diverticulosis     Past Surgical History:  Procedure Laterality Date   AV FISTULA PLACEMENT Left 06/02/2015   Procedure: BRACHIOCEPHALIC ARTERIOVENOUS (AV) FISTULA CREATION ;  Surgeon: Conrad Kewanna, MD;  Location: [place];  Service: Vascular;  Laterality: Left;   [place] Left 07/27/2015   Procedure: FIRST STAGE BASILIC VEIN TRANSPOSITION LEFT UPPER ARM;  Surgeon: Conrad Watchtower, MD;  Location: [place];  Service: Vascular;  Laterality:  Left;   [place] Left 09/2015   second phase   [place] Left 10/12/2015   Procedure: SECOND STAGE BASILIC VEIN TRANSPOSITION LEFT ARM;  Surgeon: Conrad Traill, MD;  Location: [place];  Service: Vascular;  Laterality: Left;   BREAST BIOPSY Left 08/23/07   benign fibrocystic with duct ectasia   CARDIAC CATHETERIZATION  07-11-2012  dr Irish Lack   Abnormal stress test/   normal coronary arteries/  LVEDP  8mmHg   CARDIOVASCULAR STRESS TEST  06-26-2012  dr Irish Lack   marked ischemia in the basal anterior, mid anterior, apical inferior regions/  normal LVF, ef 63%   CATARACT EXTRACTION W/ INTRAOCULAR LENS  IMPLANT, BILATERAL     COLONOSCOPY WITH PROPOFOL N/A 10/17/2016   Procedure: COLONOSCOPY WITH PROPOFOL;  Surgeon: Garlan Fair, MD;  Location: [facility];  Service: Endoscopy;  Laterality: N/A;   CYSTOSCOPY W/ URETERAL STENT PLACEMENT  04/04/2012   Procedure: CYSTOSCOPY WITH RETROGRADE PYELOGRAM/URETERAL STENT PLACEMENT;  Surgeon: Ailene Rud, MD;  Location: [place];  Service: Urology;  Laterality: Left;   CYSTOSCOPY W/ URETERAL STENT PLACEMENT Bilateral 05/04/2015   Procedure: CYSTOSCOPY WITH BILATERAL RETROGRADE PYELOGRAM/ WITH INTERPRETATION, EXCHANGE OF RIGHT URETERAL STENT REPLACEMENT AND PLACEMENT LEFT URETERAL STENT PLACEMENT EXAMINATION OF VAGINA;  Surgeon: Carolan Clines, MD;  Location: [facility];  Service: Urology;  Laterality: Bilateral;   CYSTOSCOPY WITH STENT PLACEMENT Right 10/28/2014   Procedure: RIGHT URETERAL STENT PLACEMENT;  Surgeon: Irine Seal, MD;  [facility];  Service: Urology;  Laterality: Right;   CYSTOSCOPY WITH STENT PLACEMENT Right 02/26/2015   Procedure: CYSTOSCOPY RETROGRADE PYELOGRAM WITH STENT PLACEMENT;  Surgeon: Cleon Gustin, MD;  Location: [facility];  Service: Urology;  Laterality: Right;   CYSTOSCOPY/RETROGRADE/URETEROSCOPY/STONE EXTRACTION WITH BASKET Right 11/21/2014   Procedure:  CYSTOSCOPY/RIGHT RETROGRADE PYELOGRAM/RIGHT URETEROSCOPY/BASKET EXTRACTION/RIGHT PYELOSCOPY/LASER OF STONE/RIGHT DOUBLE J STENT;  Surgeon: Carolan Clines, MD;  Location: [place];  Service: Urology;  Laterality: Right;   ESOPHAGOGASTRODUODENOSCOPY (EGD) WITH PROPOFOL Left 08/25/2018   Procedure: ESOPHAGOGASTRODUODENOSCOPY (EGD) WITH PROPOFOL;  Surgeon: Ronald Lobo, MD;  Location: [place];  Service: Endoscopy;  Laterality: Left;   EXTRACORPOREAL SHOCK WAVE LITHOTRIPSY  05-28-2012  &  10-08-2012   HEMATOMA EVACUATION Left 10/27/2020   Procedure: EVACUATION HEMATOMA, repair of Pseudoaneurysm;  Surgeon: Waynetta Sandy, MD;  Location: [place];  Service: Vascular;  Laterality: Left;   HIP ARTHROPLASTY Left 06/09/2020   Procedure: ARTHROPLASTY BIPOLAR HIP (HEMIARTHROPLASTY);  Surgeon: Marchia Bond, MD;  Location: [place];  Service: Orthopedics;  Laterality: Left;   HOLMIUM LASER APPLICATION Right 7/0/6237   Procedure: HOLMIUM LASER APPLICATION;  Surgeon: Carolan Clines, MD;  Location[facility];  Service: Urology;  Laterality: Right;   IR GENERIC HISTORICAL  02/01/2016   IR NEPHROSTOMY EXCHANGE RIGHT 02/01/2016 Greggory Keen, MD [facility]   IR GENERIC HISTORICAL  02/24/2016  IR PATIENT EVAL TECH 0-60 MINS 02/24/2016 Aletta Edouard, MD [facility]   KNEE ARTHROSCOPY Left 02-14-2003   LAPAROSCOPIC CHOLECYSTECTOMY  03-23-2005   TOTAL ABDOMINAL HYSTERECTOMY W/ BILATERAL SALPINGOOPHORECTOMY  1993   secondary to fibroids   TOTAL KNEE ARTHROPLASTY Left 10/22/2019   TOTAL KNEE ARTHROPLASTY Left 10/22/2019   Procedure: TOTAL KNEE ARTHROPLASTY;  Surgeon: Marchia Bond, MD;  Location: [place];  Service: Orthopedics;  Laterality: Left;   TRANSTHORACIC ECHOCARDIOGRAM  04-09-2012   normal LVF,  ef 60-65%,  mild LAE,  mild TR, trivial MR and PR    Prior to Admission medications   Medication Sig Start Date End Date Taking? Authorizing Provider  amiodarone  (PACERONE) 200 MG tablet Take 1 tablet (200 mg total) by mouth daily. 08/24/20  Yes Jettie Booze, MD  apixaban (ELIQUIS) 5 MG TABS tablet Take 1 tablet (5 mg total) by mouth 2 (two) times daily. 01/09/21  Yes Dwyane Dee, MD  colestipol (COLESTID) 1 g tablet Take 2 tablets (2 g total) by mouth at bedtime. Patient taking differently: Take 2 g by mouth 2 (two) times daily. 01/09/21  Yes Dwyane Dee, MD  gabapentin (NEURONTIN) 600 MG tablet Take 0.5 tablets (300 mg total) by mouth at bedtime. 12/02/20  Yes Love, Ivan Anchors, PA-C  multivitamin (RENA-VIT) TABS tablet Take 1 tablet by mouth at bedtime. 07/27/19  Yes [provider]  Potassium Chloride ER 20 MEQ TBCR Take 1 tablet by mouth daily. 02/13/21  Yes [provider]  saccharomyces boulardii (FLORASTOR) 250 MG capsule Take 1 capsule (250 mg total) by mouth 2 (two) times daily. 07/08/15  Yes Donne Hazel, MD  VANCOMYCIN HCL PO Take 1 capsule by mouth every other day.   Yes [provider]  heparin 1000 unit/mL SOLN injection Heparin Sodium (Porcine) 1,000 Units/mL Catheter Lock Arterial 10/24/20 10/23/21  [provider]  polycarbophil (FIBERCON) 625 MG tablet Take 1 tablet (625 mg total) by mouth 2 (two) times daily before lunch and supper. Patient not taking: Reported on 02/26/2021 12/02/20   Bary Leriche, PA-C    Scheduled Meds:  amiodarone  200 mg Oral Daily   colestipol  2 g Oral QHS   gabapentin  300 mg Oral QHS   saccharomyces boulardii  250 mg Oral BID   Continuous Infusions: PRN Meds:.acetaminophen **OR** acetaminophen, loperamide, ondansetron (ZOFRAN) IV  Allergies as of 02/25/2021 - Review Complete 02/25/2021  Allergen Reaction Noted   Astemizole Nausea And Vomiting 05/27/2019   Fluorouracil Rash 05/27/2019   Vicodin [hydrocodone-acetaminophen] Nausea And Vomiting 05/23/2012   Chlorhexidine Rash 06/02/2015   Percocet [oxycodone-acetaminophen] Nausea And Vomiting 05/22/2012    Family  History  Problem Relation Age of Onset   Hypertension Mother    Cancer Mother 3       breast   Dementia Mother    Hypertension Brother    Diabetes Brother    Heart disease Brother        before age 32   Cancer Father 68       pancreatic   Heart failure Paternal Grandmother    Bladder Cancer Maternal Grandfather     Social History   Socioeconomic History   Marital status: Widowed    Spouse name: Not on file   Number of children: 1   Years of education: Not on file   Highest education level: Not on file  Occupational History   Occupation: retired   Occupation: Herbalist  Tobacco Use   Smoking status: Never  Smokeless tobacco: Never  Vaping Use   Vaping Use: Never used  Substance and Sexual Activity   Alcohol use: No    Alcohol/week: 0.0 standard drinks   Drug use: No   Sexual activity: Not Currently    Birth control/protection: Post-menopausal, Surgical    Comment: widow husband passed 5/05 with lung cancer  Other Topics Concern   Not on file  Social History Narrative   ** Merged History Encounter **       Social Determinants of Health   Financial Resource Strain: Not on file  Food Insecurity: Not on file  Transportation Needs: Not on file  Physical Activity: Not on file  Stress: Not on file  Social Connections: Not on file  Intimate Partner Violence: Not on file    Review of Systems: Review of Systems  Constitutional:  Negative for chills and fever.  HENT:  Negative for sinus pain and sore throat.   Eyes:  Negative for pain and redness.  Respiratory:  Negative for cough and shortness of breath.   Cardiovascular:  Negative for chest pain and palpitations.  Gastrointestinal:  Positive for blood in stool, diarrhea, nausea and vomiting. Negative for abdominal pain, constipation, heartburn and melena.  Genitourinary:  Negative for flank pain and hematuria.  Musculoskeletal:  Negative for falls and joint pain.  Skin:  Negative for itching and rash.   Neurological:  Positive for weakness. Negative for loss of consciousness.  Psychiatric/Behavioral:  Negative for substance abuse. The patient is not nervous/anxious.     Physical Exam: Vital signs: Vitals:   02/26/21 0700 02/26/21 0810  BP: (!) 170/62 (!) 188/69  Pulse: 67 72  Resp: 10 12  Temp:    SpO2: 96% 99%      Physical Exam Vitals reviewed.  Constitutional:      General: She is not in acute distress.    Appearance: She is not ill-appearing.  HENT:     Head: Normocephalic and atraumatic.     Nose: Nose normal. No congestion.     Mouth/Throat:     Mouth: Mucous membranes are moist.     Pharynx: Oropharynx is clear.  Eyes:     General: No scleral icterus.    Extraocular Movements: Extraocular movements intact.  Cardiovascular:     Rate and Rhythm: Normal rate and regular rhythm.  Pulmonary:     Effort: Pulmonary effort is normal. No respiratory distress.  Abdominal:     General: Bowel sounds are normal. There is no distension.     Palpations: Abdomen is soft. There is no mass.     Tenderness: There is no abdominal tenderness. There is no guarding or rebound.  Musculoskeletal:        General: No swelling or tenderness.     Cervical back: Normal range of motion and neck supple.  Skin:    General: Skin is warm and dry.  Neurological:     General: No focal deficit present.     Mental Status: She is oriented to person, place, and time. She is lethargic.  Psychiatric:        Mood and Affect: Mood normal.        Behavior: Behavior normal. Behavior is cooperative.    GI:  Lab Results: Recent Labs    02/25/21 2253 02/26/21 0509  WBC 11.8* 13.4*  HGB 8.8* 8.3*  HCT 28.7* 27.1*  PLT 291 278   BMET Recent Labs    02/25/21 2253 02/26/21 0509  NA 137 136  K 3.7  3.8  CL 98 98  CO2 31 31  GLUCOSE 81 85  BUN 8 9  CREATININE 2.11* 2.27*  CALCIUM 7.7* 7.7*   LFT Recent Labs    02/26/21 0509  PROT 4.1*  ALBUMIN 1.7*  AST 19  ALT 15  ALKPHOS 93   BILITOT 1.0   PT/INR Recent Labs    02/26/21 0509  LABPROT 32.5*  INR 3.2*     Studies/Results: DG Chest Portable 1 View  Result Date: 02/25/2021 CLINICAL DATA:  Fatigue, weakness EXAM: PORTABLE CHEST 1 VIEW COMPARISON:  12/31/2020 FINDINGS: Right dialysis catheter remains in place, unchanged. Cardiomegaly with vascular congestion. Perihilar and lower lobe airspace disease, likely edema. Suspect layering effusions. IMPRESSION: Cardiomegaly with mild CHF.  Suspect layering effusions. Electronically Signed   By: Rolm Baptise M.D.   On: 02/25/2021 19:34    Impression: Diarrhea, anemia, heme-positive stool, malnutrition: unknown source of diarrhea.  History of C. Diff:  C diff testing in 12/2020 was indeterminate (Antigen positive, Toxin negative), and she was started on Vancomycin. Extended dosing, given history of C diff, was initiated. No improvement in diarrhea on Vancomycin. -Repeat C diff and stool culture pending -Hgb 8.3, stable -INR 3.2 -Normal pancreatic fecal elastase as an outpatient 02/23/21 -Albumin 1.7  End-stage renal disease (HD tues, thurs, sat): planning for HD tomorrow -BUN 9/ Cr 2.27  Severe aortic stenosis  Diastolic congestive heart failure (Echo 09/2020 EF 55-60%)  History of C. Diff:  C diff testing in 12/2020 was indeterminate (Antigen positive, Toxin negative), and she was started on Vancomycin. Extended dosing, given history of C diff, was initiated. No improvement in diarrhea on Vancomycin. -Repeat C diff and stool culture pending  Paroxysmal A fib (on Eliquis, last dose 9/8)  GERD  Plan: Stop colestipol and Imodium.  Continue to hold Eliquis.  Addendum: C. Diff toxin is now positive.  Patient has already had treatment with vancomycin ( Given severity of symptoms, would proceed with Vancomycin for treatment of possible C diff (indeterminate stool testing). Start Vancomycin 125 mg 4 times daily for 10 to 14 days, then 125 mg twice daily for 7 days, then  125 mg once daily for 7 days, then 125 mg every 2 or 3 days for 2 to 8 weeks.) Thus, recommend ID consult.  We can consider proceeding with colonoscopy, pending ID consult.  If needed, and start NuLYTELY prep.  Clear liquid diet.  Recommend rectal tube during prep since patient is unable to easily ambulate to toilet or commode. Plan for colonoscopy Sunday 9/11, at which point hopefully INR will be <2. I thoroughly discussed the procedures with patient at bedside, as well as patient's daughter at via phone, to include nature, alternatives, benefits, and risks (including but not limited to bleeding, infection, perforation, anesthesia/cardiac and pulmonary complications). Patient and patient's daughter verbalized understanding and gave verbal consent to .  Hold off on NuLYTELY prep until after ID evaluation.  Continue supportive care and anti-emetics as needed.  [facility] will follow.   LOS: 0 days   Salley Slaughter  PA-C 02/26/2021, 8:28 AM  Contact #  [phone]

## 2021-02-26 NOTE — Progress Notes (Signed)
Patient seen and examined.  See earlier admission note by Dr. Cyd Silence.  She was in the emergency room.  She is waiting for hospital bed available at [facility].  75 year old female with history of ESRD, multiple previous episodes of C. difficile colitis and diarrhea presented with blood mixed in stool in the context of ongoing use of Eliquis and aspirin. Currently him dynamically stable.  Repeat CT of chest positive for antigen and toxin.  I discussed with infectious disease, planning to start on [facility] following and planning EGD and colonoscopy on 9/11.  Updated nephrology team for hemodialysis need for tomorrow 9/10. Patient is stable and can be transferred to [facility] for admission.  No charge visit.

## 2021-02-26 NOTE — Consult Note (Addendum)
Date of Admission:  02/25/2021          Reason for Consult: Recurrent C. difficile colitis  Referring Provider: Barb Merino, MD   Assessment:  Recurrent C. difficile colitis End-stage renal disease on hemodialysis Paroxysmal atrial fibrillation on anticoagulation Severe aortic stenosis Diastolic heart failure Fecal occult blood positive stools and red blood per rectum  Plan:  Changed to Dificid twice daily to complete at least 10 days of treatment AVOID unnecessary antibiotics including the keflex she has been getting in the past Avoid PPI unless absolutely necessary Avoid imodium Would DC probiotics--not found to be of benefit in preventing C difficile   I will check in on her at least once this weekend to try to assess her response to therapy.  Note if she has a prep for endoscopy this is going to confound assessment of numbers of loose stools so will need to delay assessing her response until prep is out of her system.  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 female with multiple medical problems including end-stage renal disease on hemodialysis severe aortic stenosis diastolic congestive heart failure paroxysmal atrial atrial fibrillation on Eliquis so esophageal reflux disease presents to [facility] Emergency Department with progressive  diarrhea and profound weakness.  Of note the patient was hospitalized in December 2021 for mechanical fall and found to have a hip fracture which was repaired on June 09, 2020.  During that hospitalization she had C. difficile testing which was positive toxin negative PCR positive and thought to reflect colonization.  Of note she was apparently on chronic Keflex prior to that hospitalization.  She was then again hospitalized in July and treated for a "urinary tract infection with ESBL coli and Proteus.  She had some GI bleeding at the time and loose stools and again had C. difficile testing which was antigen positive toxin negative PCR positive and again thought to reflect colonization.  In the outpatient setting setting however she has been struggling with C. difficile colitis with apparent confirmatory testing.   Had apparently been on vancomycin 125 4 times daily for 14 days then 125 twice daily for 7 days then 25 once daily x7 days and 125 daily every 2 to 3 days for the past 8 weeks.  She also was started on colestipol and Imodium.  However after initial improvement she had worsening loose bowel movements at times having up to 10 bowel movements per day with also some red blood per rectum noted.  She has become profoundly weak and been admitted to the hospitalist service.  Stool testing during this admission is positive for C. difficile with positive antigen and positive toxin this time.  We will change her over to Dificid to treat her C. difficile.  If possible I would avoid PPIs.  Certainly all unnecessary antibiotics should be avoided.  I have never seen a dialysis patient with recurrent urinary tract infections I wonder if this is due to mis-diagnosis by providers. Certainly I would not expect her urine to be sterile.  I spent 84 minutes with the patient including than 50% of the time in face to face counseling of the patient regarding the nature of C. difficile colitis, need to  avoid unnecessary antibiotics, personally reviewing the abdomen pelvis done on January 07, 2021, CBC differential comfort and metabolic panel C. difficile testing along with review of medical records in preparation for the visit and during the visit and in coordination of her care.       Review of Systems: Review of Systems  Unable to perform ROS: Acuity of condition   Past Medical History:  Diagnosis Date   Arthritis of left knee    Cardiomyopathy ([place])    a. h/o LV dysfunction EF 20-25% in 2013 due to sepsis.>> improved to normal    Chronic diastolic CHF (congestive heart failure) ([place])    10/ 2013 in setting of septic shock   Complication of anesthesia    use a little anesthesia , per patient MD states she quit breathing (2016); hard to wake up   ESRD (end stage renal disease) ([place])    dialysis Tues, Thurs, Sat [address], sees dr deterding    GERD (gastroesophageal reflux disease)    Glaucoma    both eyes   H/O hiatal hernia    a. CT 2017: large gastric hiatal hernia.   History of blood transfusion 04/13/2015   History of kidney stones    10/18/2019: per patient "has a couple currently one in each kidney"   History of nephrostomy 04/11/2015   currently inplace 04/28/2015  removed now   Hyperlipidemia    Hypertension    medication removed from regimen due to low blood pressure    Iron deficiency    hx   Nephrolithiasis 2002, 2006   bilateral   PAF (paroxysmal atrial fibrillation) ([place])    a. 10/ 2013  in setting of Septic Shock //  b. recurrent during admit for pneumonia, L effusion >> placed on Amiodarone // Coumadin for anticoagulation   Primary localized osteoarthritis of right hip 10/22/2019   S/P hemodialysis catheter insertion ([facility]) 04/11/2015   right anterior chest , only used once    Sigmoid diverticulosis     Social History   Tobacco Use   Smoking status: Never   Smokeless tobacco: Never  Vaping Use   Vaping Use: Never used  Substance Use Topics    Alcohol use: No    Alcohol/week: 0.0 standard drinks   Drug use: No    Family History  Problem Relation Age of Onset   Hypertension Mother    Cancer Mother 80       breast   Dementia Mother    Hypertension Brother    Diabetes Brother    Heart disease Brother        before age 51   Cancer Father 27       pancreatic   Heart failure Paternal Grandmother    Bladder Cancer Maternal Grandfather    Allergies  Allergen Reactions   Astemizole Nausea And Vomiting   Fluorouracil Rash   Vicodin [Hydrocodone-Acetaminophen] Nausea And Vomiting   Chlorhexidine Rash    Sunburn    rash   Percocet [Oxycodone-Acetaminophen] Nausea And Vomiting    OBJECTIVE: Blood pressure (!) 160/55, pulse 73, temperature 97.6 F (36.4 C), temperature source Oral, resp.  rate 14, last menstrual period 01/19/1992, SpO2 96 %.  Physical Exam Constitutional:      Appearance: Normal appearance. She is well-developed. She is not ill-appearing or diaphoretic.  HENT:     Head: Normocephalic and atraumatic.     Right Ear: Hearing and external ear normal.     Left Ear: Hearing and external ear normal.     Nose: No nasal deformity or rhinorrhea.  Eyes:     General: No scleral icterus.       Right eye: No discharge.        Left eye: No discharge.     Extraocular Movements: Extraocular movements intact.     Conjunctiva/sclera: Conjunctivae normal.     Right eye: Right conjunctiva is not injected.     Left eye: Left conjunctiva is not injected.  Neck:     Vascular: No JVD.  Cardiovascular:     Rate and Rhythm: Normal rate and regular rhythm.     Heart sounds: S1 normal and S2 normal.  Pulmonary:     Effort: No respiratory distress.     Breath sounds: No wheezing or rales.  Abdominal:     General: Bowel sounds are normal. There is no distension.     Palpations: Abdomen is soft.     Tenderness: There is no abdominal tenderness.  Musculoskeletal:        General: Normal range of motion.     Right shoulder:  Normal.     Left shoulder: Normal.     Cervical back: Normal range of motion and neck supple.     Right hip: Normal.     Left hip: Normal.     Right knee: Normal.     Left knee: Normal.  Lymphadenopathy:     Head:     Right side of head: No submandibular, preauricular or posterior auricular adenopathy.     Left side of head: No submandibular, preauricular or posterior auricular adenopathy.     Cervical: No cervical adenopathy.     Right cervical: No superficial or deep cervical adenopathy.    Left cervical: No superficial or deep cervical adenopathy.  Skin:    General: Skin is warm and dry.     Coloration: Skin is not pale.     Findings: Bruising present. No abrasion, ecchymosis, erythema, lesion or rash.     Nails: There is no clubbing.  Neurological:     General: No focal deficit present.     Mental Status: She is alert and oriented to person, place, and time.     Sensory: No sensory deficit.     Coordination: Coordination normal.  Psychiatric:        Attention and Perception: She is attentive.        Speech: Speech normal.        Behavior: Behavior normal. Behavior is cooperative.        Thought Content: Thought content normal.        Judgment: Judgment normal.    Lab Results Lab Results  Component Value Date   WBC 13.4 (H) 02/26/2021   HGB 8.3 (L) 02/26/2021   HCT 27.1 (L) 02/26/2021   MCV 97.8 02/26/2021   PLT 278 02/26/2021    Lab Results  Component Value Date   CREATININE 2.27 (H) 02/26/2021   BUN 9 02/26/2021   NA 136 02/26/2021   K 3.8 02/26/2021   CL 98 02/26/2021   CO2 31 02/26/2021    Lab Results  Component Value Date  ALT 15 02/26/2021   AST 19 02/26/2021   ALKPHOS 93 02/26/2021   BILITOT 1.0 02/26/2021     Microbiology: Recent Results (from the past 240 hour(s))  Resp Panel by RT-PCR (Flu A&B, Covid) Nasopharyngeal Swab     Status: None   Collection Time: 02/25/21  9:00 PM   Specimen: Nasopharyngeal Swab; Nasopharyngeal(NP) swabs in vial  transport medium  Result Value Ref Range Status   SARS Coronavirus 2 by RT PCR NEGATIVE NEGATIVE Final    Comment: (NOTE) SARS-CoV-2 target nucleic acids are NOT DETECTED.  The SARS-CoV-2 RNA is generally detectable in upper respiratory specimens during the acute phase of infection. The lowest concentration of SARS-CoV-2 viral copies this assay can detect is 138 copies/mL. A negative result does not preclude SARS-Cov-2 infection and should not be used as the sole basis for treatment or other patient management decisions. A negative result may occur with  improper specimen collection/handling, submission of specimen other than nasopharyngeal swab, presence of viral mutation(s) within the areas targeted by this assay, and inadequate number of viral copies(<138 copies/mL). A negative result must be combined with clinical observations, patient history, and epidemiological information. The expected result is Negative.  Fact Sheet for Patients:  EntrepreneurPulse.com.au  Fact Sheet for Healthcare Providers:  IncredibleEmployment.be  This test is no t yet approved or cleared by the [place] FDA and  has been authorized for detection and/or diagnosis of SARS-CoV-2 by FDA under an Emergency Use Authorization (EUA). This EUA will remain  in effect (meaning this test can be used) for the duration of the COVID-19 declaration under Section 564(b)(1) of the Act, 21 U.S.C.section 360bbb-3(b)(1), unless the authorization is terminated  or revoked sooner.       Influenza A by PCR NEGATIVE NEGATIVE Final   Influenza B by PCR NEGATIVE NEGATIVE Final    Comment: (NOTE) The Xpert Xpress SARS-CoV-2/FLU/RSV plus assay is intended as an aid in the diagnosis of influenza from Nasopharyngeal swab specimens and should not be used as a sole basis for treatment. Nasal washings and aspirates are unacceptable for Xpert Xpress SARS-CoV-2/FLU/RSV testing.  Fact  Sheet for Patients: EntrepreneurPulse.com.au  Fact Sheet for Healthcare Providers: IncredibleEmployment.be  This test is not yet approved or cleared by the [place] FDA and has been authorized for detection and/or diagnosis of SARS-CoV-2 by FDA under an Emergency Use Authorization (EUA). This EUA will remain in effect (meaning this test can be used) for the duration of the COVID-19 declaration under Section 564(b)(1) of the Act, 21 U.S.C. section 360bbb-3(b)(1), unless the authorization is terminated or revoked.  Performed at [facility], [place] [address], [place], [place] [address]   C Difficile Quick Screen w PCR reflex     Status: Abnormal   Collection Time: 02/26/21  4:25 AM   Specimen: STOOL  Result Value Ref Range Status   C Diff antigen POSITIVE (A) NEGATIVE Final   C Diff toxin POSITIVE (A) NEGATIVE Final   C Diff interpretation Toxin producing C. difficile detected.  Final    Comment: CRITICAL RESULT CALLED TO, READ BACK BY AND VERIFIED WITH: Sharrell Ku RN Performed at [place] [address] [place], [place] [address]     Alcide Evener, [place] [facility] [phone] pager  02/26/2021, 2:43 PM

## 2021-02-26 NOTE — ED Notes (Signed)
EKG obtained prior to administration of PO Amiodarone given Zofran administration at 0100 this AM. QT interval normal. Verified with Hospitalist Amiodarone to be given.

## 2021-02-26 NOTE — ED Notes (Signed)
ID at the bedside.

## 2021-02-26 NOTE — Consult Note (Addendum)
Renal Service Consult Note [facility]  [name] 02/26/2021 [name], MD Requesting Physician: Dr. Sloan Leiter, K.   Reason for Consult: ESRD pt w/ recurrent Cdif and diarrhea HPI: The patient is a 75 y.o. year-old w/ xh of ESRD on HD, HTN, HL, PAF, recurrent Cdif infections, UTI's, severe AS, chronic diast CHF (EF 60%), anemia presented to ED at [facility] for worsening diarrhea and gen'd weakness. Having diarrhea issues since initial Cdif dx in Dec 2021. Past 2 wks diarrhea sig worsening. No fever or abd pain. Some blood in the stool. Poor appetite. In ED Hb 8.8. K 3.8 BN 9  cR 2.27.  Alb 1.7.  Pt admitted. Asked to see for renal faliure.  Pt seen in ED.  Main c/o is fatigue and diarrhea.  Swelling in the UE's and LE's is a bit worse than usual.  Has not missed HD.   Lives w/ her dtr, gets to HD by [facility], gets into the [facility] using slideboard at home.     ROS - denies CP, no joint pain, no HA, no blurry vision, no rash, no diarrhea, no nausea/ vomiting   Past Medical History  Past Medical History:  Diagnosis Date   Arthritis of left knee    Cardiomyopathy ([place])    a. h/o LV dysfunction EF 20-25% in 2013 due to sepsis.>> improved to normal    Chronic diastolic CHF (congestive heart failure) ([place])    10/ 2013 in setting of septic shock   Complication of anesthesia    use a little anesthesia , per patient MD states she quit breathing (2016); hard to wake up   ESRD (end stage renal disease) ([place])    dialysis Tues, Thurs, Sat [address], sees dr deterding    GERD (gastroesophageal reflux disease)    Glaucoma    both eyes   H/O hiatal hernia    a. CT 2017: large gastric hiatal hernia.   History of blood transfusion 04/13/2015   History of kidney stones    10/18/2019: per patient "has a couple currently one in each kidney"   History of nephrostomy 04/11/2015   currently inplace 04/28/2015  removed now   Hyperlipidemia    Hypertension    medication removed from  regimen due to low blood pressure    Iron deficiency    hx   Nephrolithiasis 2002, 2006   bilateral   PAF (paroxysmal atrial fibrillation) ([place])    a. 10/ 2013  in setting of Septic Shock //  b. recurrent during admit for pneumonia, L effusion >> placed on Amiodarone // Coumadin for anticoagulation   Primary localized osteoarthritis of right hip 10/22/2019   S/P hemodialysis catheter insertion ([place]) 04/11/2015   right anterior chest , only used once    Sigmoid diverticulosis    Past Surgical History  Past Surgical History:  Procedure Laterality Date   AV FISTULA PLACEMENT Left 06/02/2015   Procedure: BRACHIOCEPHALIC ARTERIOVENOUS (AV) FISTULA CREATION ;  Surgeon: Conrad Haiku-Pauwela, MD;  Location: [place];  Service: Vascular;  Laterality: Left;   [place] Left 07/27/2015   Procedure: FIRST STAGE BASILIC VEIN TRANSPOSITION LEFT UPPER ARM;  Surgeon: Conrad Rosenberg, MD;  Location: [place];  Service: Vascular;  Laterality: Left;   [place] Left 09/2015   second phase   [place] Left 10/12/2015   Procedure: SECOND STAGE BASILIC VEIN TRANSPOSITION LEFT ARM;  Surgeon: Conrad Worcester, MD;  Location: [place];  Service: Vascular;  Laterality: Left;   BREAST BIOPSY Left 08/23/07   benign fibrocystic with duct ectasia   CARDIAC CATHETERIZATION  07-11-2012  dr Irish Lack   Abnormal stress test/   normal coronary arteries/  LVEDP  36mmHg   CARDIOVASCULAR STRESS TEST  06-26-2012  dr Irish Lack   marked ischemia in the basal anterior, mid anterior, apical inferior regions/  normal LVF, ef 63%   CATARACT EXTRACTION W/ INTRAOCULAR LENS  IMPLANT, BILATERAL     COLONOSCOPY WITH PROPOFOL N/A 10/17/2016   Procedure: COLONOSCOPY WITH PROPOFOL;  Surgeon: Garlan Fair, MD;  Location: [facility];  Service: Endoscopy;  Laterality: N/A;   CYSTOSCOPY W/ URETERAL STENT PLACEMENT  04/04/2012   Procedure: CYSTOSCOPY WITH RETROGRADE PYELOGRAM/URETERAL STENT PLACEMENT;  Surgeon:  Ailene Rud, MD;  Location: [place];  Service: Urology;  Laterality: Left;   CYSTOSCOPY W/ URETERAL STENT PLACEMENT Bilateral 05/04/2015   Procedure: CYSTOSCOPY WITH BILATERAL RETROGRADE PYELOGRAM/ WITH INTERPRETATION, EXCHANGE OF RIGHT URETERAL STENT REPLACEMENT AND PLACEMENT LEFT URETERAL STENT PLACEMENT EXAMINATION OF VAGINA;  Surgeon: Carolan Clines, MD;  Location: [facility];  Service: Urology;  Laterality: Bilateral;   CYSTOSCOPY WITH STENT PLACEMENT Right 10/28/2014   Procedure: RIGHT URETERAL STENT PLACEMENT;  Surgeon: Irine Seal, MD;  Location: [facility];  Service: Urology;  Laterality: Right;   CYSTOSCOPY WITH STENT PLACEMENT Right 02/26/2015   Procedure: CYSTOSCOPY RETROGRADE PYELOGRAM WITH STENT PLACEMENT;  Surgeon: Cleon Gustin, MD;  Location: [facility];  Service: Urology;  Laterality: Right;   CYSTOSCOPY/RETROGRADE/URETEROSCOPY/STONE EXTRACTION WITH BASKET Right 11/21/2014   Procedure: CYSTOSCOPY/RIGHT RETROGRADE PYELOGRAM/RIGHT URETEROSCOPY/BASKET EXTRACTION/RIGHT PYELOSCOPY/LASER OF STONE/RIGHT DOUBLE J STENT;  Surgeon: Carolan Clines, MD;  Location: [place];  Service: Urology;  Laterality: Right;   ESOPHAGOGASTRODUODENOSCOPY (EGD) WITH PROPOFOL Left 08/25/2018   Procedure: ESOPHAGOGASTRODUODENOSCOPY (EGD) WITH PROPOFOL;  Surgeon: Ronald Lobo, MD;  Location: [place];  Service: Endoscopy;  Laterality: Left;   EXTRACORPOREAL SHOCK WAVE LITHOTRIPSY  05-28-2012  &  10-08-2012   HEMATOMA EVACUATION Left 10/27/2020   Procedure: EVACUATION HEMATOMA, repair of Pseudoaneurysm;  Surgeon: Waynetta Sandy, MD;  Location: [place];  Service: Vascular;  Laterality: Left;   HIP ARTHROPLASTY Left 06/09/2020   Procedure: ARTHROPLASTY BIPOLAR HIP (HEMIARTHROPLASTY);  Surgeon: Marchia Bond, MD;  Location: [place];  Service: Orthopedics;  Laterality: Left;   HOLMIUM LASER APPLICATION Right 07/28/3660   Procedure: HOLMIUM LASER APPLICATION;   Surgeon: Carolan Clines, MD;  Location: [facility];  Service: Urology;  Laterality: Right;   IR GENERIC HISTORICAL  02/01/2016   IR NEPHROSTOMY EXCHANGE RIGHT 02/01/2016 Greggory Keen, MD [facility]   IR GENERIC HISTORICAL  02/24/2016   IR PATIENT EVAL TECH 0-60 MINS 02/24/2016 Aletta Edouard, MD [facility]   KNEE ARTHROSCOPY Left 02-14-2003   LAPAROSCOPIC CHOLECYSTECTOMY  03-23-2005   TOTAL ABDOMINAL HYSTERECTOMY W/ BILATERAL SALPINGOOPHORECTOMY  1993   secondary to fibroids   TOTAL KNEE ARTHROPLASTY Left 10/22/2019   TOTAL KNEE ARTHROPLASTY Left 10/22/2019   Procedure: TOTAL KNEE ARTHROPLASTY;  Surgeon: Marchia Bond, MD;  Location: [place];  Service: Orthopedics;  Laterality: Left;   TRANSTHORACIC ECHOCARDIOGRAM  04-09-2012   normal LVF,  ef 60-65%,  mild LAE,  mild TR, trivial MR and PR   Family History  Family History  Problem Relation Age of Onset   Hypertension Mother    Cancer Mother 16       breast   Dementia Mother    Hypertension Brother    Diabetes Brother    Heart disease  Brother        before age 39   Cancer Father 34       pancreatic   Heart failure Paternal Grandmother    Bladder Cancer Maternal Grandfather    Social History  reports that she has never smoked. She has never used smokeless tobacco. She reports that she does not drink alcohol and does not use drugs. Allergies  Allergies  Allergen Reactions   Astemizole Nausea And Vomiting   Fluorouracil Rash   Vicodin [Hydrocodone-Acetaminophen] Nausea And Vomiting   Chlorhexidine Rash    Sunburn    rash   Percocet [Oxycodone-Acetaminophen] Nausea And Vomiting   Home medications Prior to Admission medications   Medication Sig Start Date End Date Taking? Authorizing Provider  amiodarone (PACERONE) 200 MG tablet Take 1 tablet (200 mg total) by mouth daily. 08/24/20  Yes Jettie Booze, MD  apixaban (ELIQUIS) 5 MG TABS tablet Take 1 tablet (5 mg total) by mouth 2 (two) times daily. 01/09/21   Yes Dwyane Dee, MD  colestipol (COLESTID) 1 g tablet Take 2 tablets (2 g total) by mouth at bedtime. Patient taking differently: Take 2 g by mouth 2 (two) times daily. 01/09/21  Yes Dwyane Dee, MD  gabapentin (NEURONTIN) 600 MG tablet Take 0.5 tablets (300 mg total) by mouth at bedtime. 12/02/20  Yes Love, Ivan Anchors, PA-C  multivitamin (RENA-VIT) TABS tablet Take 1 tablet by mouth at bedtime. 07/27/19  Yes [provider]  Potassium Chloride ER 20 MEQ TBCR Take 1 tablet by mouth daily. 02/13/21  Yes [provider]  saccharomyces boulardii (FLORASTOR) 250 MG capsule Take 1 capsule (250 mg total) by mouth 2 (two) times daily. 07/08/15  Yes Donne Hazel, MD  VANCOMYCIN HCL PO Take 1 capsule by mouth every other day.   Yes [provider]  fidaxomicin (DIFICID) 200 MG TABS tablet Take 1 tablet (200 mg total) by mouth 2 (two) times daily. 02/26/21   Barb Merino, MD  heparin 1000 unit/mL SOLN injection Heparin Sodium (Porcine) 1,000 Units/mL Catheter Lock Arterial 10/24/20 10/23/21  [provider]  polycarbophil (FIBERCON) 625 MG tablet Take 1 tablet (625 mg total) by mouth 2 (two) times daily before lunch and supper. Patient not taking: Reported on 02/26/2021 12/02/20   Bary Leriche, PA-C     Vitals:   02/26/21 0700 02/26/21 0810 02/26/21 0916 02/26/21 1220  BP: (!) 170/62 (!) 188/69 (!) 157/61 (!) 160/55  Pulse: 67 72 75 73  Resp: 10 12 13 14   Temp:   97.6 F (36.4 C)   TempSrc:   Oral   SpO2: 96% 99% 100% 96%   Exam Gen alert, no distress No rash, cyanosis or gangrene Sclera anicteric, throat clear  No jvd or bruits Chest clear bilat to bases, no rales/ wheezing RRR no MRG Abd soft ntnd no mass or ascites +bs MS no joint effusions or deformity Ext 2+ diffuse pitting edema or bilat UE's and LE's,  no wounds or ulcers Neuro is alert, Ox 3 , nf, gen'd weakness and frailty is worse this admit  LUA AVF+bruit, RIJ TDC intact   Home meds include  amiodarone, eliquis, colestipol, neurontin 300 hs, renavit, KCl, florastor, po vanc, fibercon   CXR - IMPRESSION: Cardiomegaly with mild CHF. Suspect layering effusions.   OP HD: [facility] TTS  4h  400/500  68.5kg   2/2 bath P2  Hep none    AVF / TDC   - venofer 100mg  x 5, has had 2  doses   - mircera 150 q2, last 02/18/21, due 9/15     Assessment/ Plan: Acute LGIB - per GI/ pmd A/C diarrhea - + for Cdif toxin/ antigen here, per pmd ESRD: TTS HD. HD tomorrow.  Hypertension/volume: has chronic LE/ UE edema, worsening over the year, very low albumin 1.7 Anemia of ESRD: Hb mid 8's. cont IV iron loading here, 3 more doses. Next esa due on 9/15.    Metabolic bone disease: CorrCa 9, Phos pending. Not on binders at home.  Hx of ESBL E.coli UTI - and other pathogens.  Recent L arm compartment syndrome - sp AVF revision / repair Chronic combined CHF Paroxysmal A-Fib - takes Eliquis/amiodarone. Severe aortic stenosis      Kelly Splinter  MD 02/26/2021, 2:31 PM  Recent Labs  Lab 02/25/21 2253 02/26/21 0509  WBC 11.8* 13.4*  HGB 8.8* 8.3*   Recent Labs  Lab 02/25/21 2253 02/26/21 0509  K 3.7 3.8  BUN 8 9  CREATININE 2.11* 2.27*  CALCIUM 7.7* 7.7*

## 2021-02-26 NOTE — ED Notes (Signed)
Pt A&O x4. Attached to cardiac monitor x3. VSS.

## 2021-02-26 NOTE — ED Notes (Signed)
Pt is C. Diff positive. Received from lab. Hospitalist notified and aware.

## 2021-02-27 DIAGNOSIS — K625 Hemorrhage of anus and rectum: Secondary | ICD-10-CM | POA: Diagnosis not present

## 2021-02-27 DIAGNOSIS — I480 Paroxysmal atrial fibrillation: Secondary | ICD-10-CM | POA: Diagnosis not present

## 2021-02-27 DIAGNOSIS — N186 End stage renal disease: Secondary | ICD-10-CM | POA: Diagnosis not present

## 2021-02-27 DIAGNOSIS — K922 Gastrointestinal hemorrhage, unspecified: Secondary | ICD-10-CM | POA: Diagnosis not present

## 2021-02-27 LAB — CBC
HCT: 25.9 % — ABNORMAL LOW (ref 36.0–46.0)
Hemoglobin: 8.1 g/dL — ABNORMAL LOW (ref 12.0–15.0)
MCH: 30.2 pg (ref 26.0–34.0)
MCHC: 31.3 g/dL (ref 30.0–36.0)
MCV: 96.6 fL (ref 80.0–100.0)
Platelets: 255 10*3/uL (ref 150–400)
RBC: 2.68 MIL/uL — ABNORMAL LOW (ref 3.87–5.11)
RDW: 21.9 % — ABNORMAL HIGH (ref 11.5–15.5)
WBC: 13.2 10*3/uL — ABNORMAL HIGH (ref 4.0–10.5)
nRBC: 0 % (ref 0.0–0.2)

## 2021-02-27 LAB — RENAL FUNCTION PANEL
Albumin: 1.5 g/dL — ABNORMAL LOW (ref 3.5–5.0)
Anion gap: 5 (ref 5–15)
BUN: 13 mg/dL (ref 8–23)
CO2: 29 mmol/L (ref 22–32)
Calcium: 7.6 mg/dL — ABNORMAL LOW (ref 8.9–10.3)
Chloride: 101 mmol/L (ref 98–111)
Creatinine, Ser: 3.51 mg/dL — ABNORMAL HIGH (ref 0.44–1.00)
GFR, Estimated: 13 mL/min — ABNORMAL LOW (ref >60)
Glucose, Bld: 122 mg/dL — ABNORMAL HIGH (ref 70–99)
Phosphorus: 2.7 mg/dL (ref 2.5–4.6)
Potassium: 4.3 mmol/L (ref 3.5–5.1)
Sodium: 135 mmol/L (ref 135–145)

## 2021-02-27 LAB — RETICULOCYTES
Immature Retic Fract: 23.8 % — ABNORMAL HIGH (ref 2.3–15.9)
RBC.: 2.75 MIL/uL — ABNORMAL LOW (ref 3.87–5.11)
Retic Count, Absolute: 96.8 10*3/uL (ref 19.0–186.0)
Retic Ct Pct: 3.5 % — ABNORMAL HIGH (ref 0.4–3.1)

## 2021-02-27 LAB — PROTIME-INR
INR: 2.9 — ABNORMAL HIGH (ref 0.8–1.2)
Prothrombin Time: 30.3 seconds — ABNORMAL HIGH (ref 11.4–15.2)

## 2021-02-27 LAB — TYPE AND SCREEN
ABO/RH(D): O POS
Antibody Screen: NEGATIVE

## 2021-02-27 LAB — IRON AND TIBC
Iron: 26 ug/dL — ABNORMAL LOW (ref 28–170)
Saturation Ratios: UNDETERMINED % (ref 10.4–31.8)
TIBC: UNDETERMINED ug/dL (ref 250–450)
UIBC: UNDETERMINED ug/dL

## 2021-02-27 LAB — FERRITIN: Ferritin: 1081 ng/mL — ABNORMAL HIGH (ref 11–307)

## 2021-02-27 LAB — APTT: aPTT: >200 seconds (ref 24–36)

## 2021-02-27 LAB — HEPARIN LEVEL (UNFRACTIONATED): Heparin Unfractionated: >1.1 IU/mL — ABNORMAL HIGH (ref 0.30–0.70)

## 2021-02-27 LAB — VITAMIN B12: Vitamin B-12: 1093 pg/mL — ABNORMAL HIGH (ref 180–914)

## 2021-02-27 MED ORDER — APIXABAN 5 MG PO TABS: 5.0000 mg | ORAL_TABLET | Freq: Two times a day (BID) | ORAL | Status: DC

## 2021-02-27 MED ORDER — ADULT MULTIVITAMIN W/MINERALS CH: 1.0000 | ORAL_TABLET | Freq: Every day | ORAL | Status: DC

## 2021-02-27 MED ORDER — RENA-VITE PO TABS
1.0000 | ORAL_TABLET | Freq: Every day | ORAL | Status: DC
  Administered 2021-02-27 – 2021-03-02 (×4): 1 via ORAL
  Filled 2021-02-27 (×4): qty 1

## 2021-02-27 MED ORDER — HEPARIN (PORCINE) 25000 UT/250ML-% IV SOLN
950.0000 [IU]/h | INTRAVENOUS | Status: DC
  Administered 2021-02-27: 950 [IU]/h via INTRAVENOUS
  Filled 2021-02-27: qty 250

## 2021-02-27 MED ORDER — BOOST / RESOURCE BREEZE PO LIQD CUSTOM
1.0000 | Freq: Three times a day (TID) | ORAL | Status: DC
  Administered 2021-02-27 – 2021-03-01 (×2): 1 via ORAL

## 2021-02-27 MED ORDER — HEPARIN SODIUM (PORCINE) 1000 UNIT/ML IJ SOLN
INTRAMUSCULAR | Status: AC
  Filled 2021-02-27: qty 4

## 2021-02-27 NOTE — Evaluation (Signed)
Physical Therapy Evaluation Patient Details Name: [name] MRN: [id number] DOB: [birth year] Today's Date: 02/27/2021   History of Present Illness  The pt is a 75 y.o. female  presenting c/o weakness, lethargy. She has had diahrehea and bloody stools for abolut a week now. She is on HD. PMH includes: recent admission for LUE hematoma with concern for compartment syndrome, arthritis, cardiomyopathy, CHF, ESRD, GERD, HLD, HTN, MI, a fib, L THA and L TKA.  Clinical Impression  Patient presents with a low baseline mobility. She was able to sit up at the edge of the bed and scoot up and down the bed with some assist. She reports this is baseline although she is weaker and having more pain. She will likely be able to go home with daughter if the patients daughter can continue to provide this level of care on a consistent basis. She would otherwise benefit from rehab at a SNF to improve her ability to transfer.     Follow Up Recommendations Home health PT (Or SNF depedning on the level of care the daughter can provide)    Equipment Recommendations       Recommendations for Other Services       Precautions / Restrictions Precautions Precautions: Fall Restrictions Weight Bearing Restrictions: No      Mobility  Bed Mobility Overal bed mobility: Needs Assistance Bed Mobility: Supine to Sit;Sit to Supine     Supine to sit: Min assist Sit to supine: Mod assist   General bed mobility comments: min a to sit up. Mod a to get legs back into bed. Patient able to scoot 2 scoots up the edge of the bed and back towards the head of the bed.    Transfers                 General transfer comment: uses a slide board. Not attmepted today 2nd to pain and fatigue  Ambulation/Gait                Stairs            Wheelchair Mobility    Modified Rankin (Stroke Patients Only)       Balance                                             Pertinent  Vitals/Pain Pain Assessment: Faces Faces Pain Scale: Hurts even more Pain Location: abdomen Pain Descriptors / Indicators: Aching;Burning;Grimacing Pain Intervention(s): Limited activity within patient's tolerance;Monitored during session    Home Living Family/patient expects to be discharged to:: Private residence Living Arrangements: Children Available Help at Discharge: Family;Available 24 hours/day Type of Home: House Home Access: Stairs to enter     Home Layout: One level Home Equipment: Grab bars - tub/shower;Walker - 4 wheels;Cane - quad;Cane - single point;Wheelchair - [employer] - 2 wheels      Prior Function Level of Independence: Needs assistance   Gait / Transfers Assistance Needed: Patient reports she was using a slide board to get to her wheelchair  ADL's / Homemaking Assistance Needed: daughter assist with bathing (LB in bed, pt reports able to wash face and UE), LB dressing, and all IADLs.        Hand Dominance   Dominant Hand: Right    Extremity/Trunk Assessment   Upper Extremity Assessment Upper Extremity Assessment: Generalized weakness    Lower Extremity Assessment Lower  Extremity Assessment: Generalized weakness    Cervical / Trunk Assessment Cervical / Trunk Assessment: Normal  Communication   Communication: No difficulties  Cognition Arousal/Alertness: Awake/alert Behavior During Therapy: WFL for tasks assessed/performed Overall Cognitive Status: Within Functional Limits for tasks assessed                                        General Comments      Exercises     Assessment/Plan    PT Assessment Patient needs continued PT services  PT Problem List Decreased strength;Decreased range of motion;Decreased activity tolerance;Decreased balance;Decreased mobility;Decreased knowledge of use of DME;Decreased safety awareness;Pain       PT Treatment Interventions DME instruction;Gait training;Stair training;Functional  mobility training;Therapeutic activities;Therapeutic exercise;Neuromuscular re-education;Patient/family education;Balance training    PT Goals (Current goals can be found in the Care Plan section)  Acute Rehab PT Goals Patient Stated Goal: to feel better PT Goal Formulation: With patient Time For Goal Achievement: 03/06/21 Potential to Achieve Goals: Good    Frequency Min 2X/week   Barriers to discharge        Co-evaluation               AM-PAC PT "6 Clicks" Mobility  Outcome Measure Help needed turning from your back to your side while in a flat bed without using bedrails?: A Lot Help needed moving from lying on your back to sitting on the side of a flat bed without using bedrails?: A Lot Help needed moving to and from a bed to a chair (including a wheelchair)?: Total Help needed standing up from a chair using your arms (e.g., wheelchair or bedside chair)?: Total Help needed to walk in hospital room?: Total Help needed climbing 3-5 steps with a railing? : Total 6 Click Score: 8    End of Session Equipment Utilized During Treatment: Gait belt Activity Tolerance: Patient limited by fatigue;Patient limited by pain Patient left: in bed;with call bell/phone within reach;with bed alarm set Nurse Communication: Mobility status PT Visit Diagnosis: Other abnormalities of gait and mobility (R26.89);Muscle weakness (generalized) (M62.81);Pain Pain - part of body:  (abdomen)    Time: 2449-7530 PT Time Calculation (min) (ACUTE ONLY): 14 min   Charges:               Carney Living PT DPT  02/27/2021, 3:15 PM

## 2021-02-27 NOTE — Progress Notes (Addendum)
Subjective: Patient reports having only 1 bowel movement since transferred to [facility]. She denies abdominal pain, was able to eat her breakfast today.    Objective: Vital signs in last 24 hours: Temp:  [97.8 F (36.6 C)-98.3 F (36.8 C)] 97.8 F (36.6 C) (09/10 0904) Pulse Rate:  [67-80] 72 (09/10 0904) Resp:  [9-18] 17 (09/10 0904) BP: (121-179)/(47-60) 155/47 (09/10 0904) SpO2:  [92 %-97 %] 95 % (09/10 0904) Weight:  [67.8 kg] 67.8 kg (09/10 0057) Weight change:  Last BM Date: 02/26/21  PE: Ill-appearing GENERAL: Mild pallor, no icterus, moist mucous membranes  ABDOMEN: Soft, nondistended, nontender EXTREMITIES: Bipedal pitting edema  Lab Results: Results for orders placed or performed during the hospital encounter of 02/25/21 (from the past 48 hour(s))  Resp Panel by RT-PCR (Flu A&B, Covid) Nasopharyngeal Swab     Status: None   Collection Time: 02/25/21  9:00 PM   Specimen: Nasopharyngeal Swab; Nasopharyngeal(NP) swabs in vial transport medium  Result Value Ref Range   SARS Coronavirus 2 by RT PCR NEGATIVE NEGATIVE    Comment: (NOTE) SARS-CoV-2 target nucleic acids are NOT DETECTED.  The SARS-CoV-2 RNA is generally detectable in upper respiratory specimens during the acute phase of infection. The lowest concentration of SARS-CoV-2 viral copies this assay can detect is 138 copies/mL. A negative result does not preclude SARS-Cov-2 infection and should not be used as the sole basis for treatment or other patient management decisions. A negative result may occur with  improper specimen collection/handling, submission of specimen other than nasopharyngeal swab, presence of viral mutation(s) within the areas targeted by this assay, and inadequate number of viral copies(<138 copies/mL). A negative result must be combined with clinical observations, patient history, and epidemiological information. The expected result is Negative.  Fact Sheet for Patients:   EntrepreneurPulse.com.au  Fact Sheet for Healthcare Providers:  IncredibleEmployment.be  This test is no t yet approved or cleared by the [place] FDA and  has been authorized for detection and/or diagnosis of SARS-CoV-2 by FDA under an Emergency Use Authorization (EUA). This EUA will remain  in effect (meaning this test can be used) for the duration of the COVID-19 declaration under Section 564(b)(1) of the Act, 21 U.S.C.section 360bbb-3(b)(1), unless the authorization is terminated  or revoked sooner.       Influenza A by PCR NEGATIVE NEGATIVE   Influenza B by PCR NEGATIVE NEGATIVE    Comment: (NOTE) The Xpert Xpress SARS-CoV-2/FLU/RSV plus assay is intended as an aid in the diagnosis of influenza from Nasopharyngeal swab specimens and should not be used as a sole basis for treatment. Nasal washings and aspirates are unacceptable for Xpert Xpress SARS-CoV-2/FLU/RSV testing.  Fact Sheet for Patients: EntrepreneurPulse.com.au  Fact Sheet for Healthcare Providers: IncredibleEmployment.be  This test is not yet approved or cleared by the [place] FDA and has been authorized for detection and/or diagnosis of SARS-CoV-2 by FDA under an Emergency Use Authorization (EUA). This EUA will remain in effect (meaning this test can be used) for the duration of the COVID-19 declaration under Section 564(b)(1) of the Act, 21 U.S.C. section 360bbb-3(b)(1), unless the authorization is terminated or revoked.  Performed at [facility], [place] [address], [place], [place] [address]   POC occult blood, ED Provider will collect     Status: Abnormal   Collection Time: 02/25/21  9:20 PM  Result Value Ref Range   Fecal Occult Bld POSITIVE (A) NEGATIVE  Comprehensive metabolic panel     Status: Abnormal  Collection Time: 02/25/21 10:53 PM  Result Value Ref Range   Sodium 137 135 - 145 mmol/L    Potassium 3.7 3.5 - 5.1 mmol/L   Chloride 98 98 - 111 mmol/L   CO2 31 22 - 32 mmol/L   Glucose, Bld 81 70 - 99 mg/dL    Comment: Glucose reference range applies only to samples taken after fasting for at least 8 hours.   BUN 8 8 - 23 mg/dL   Creatinine, Ser 2.11 (H) 0.44 - 1.00 mg/dL   Calcium 7.7 (L) 8.9 - 10.3 mg/dL   Total Protein 4.2 (L) 6.5 - 8.1 g/dL   Albumin 1.8 (L) 3.5 - 5.0 g/dL   AST 21 15 - 41 U/L   ALT 16 0 - 44 U/L   Alkaline Phosphatase 97 38 - 126 U/L   Total Bilirubin 0.9 0.3 - 1.2 mg/dL   GFR, Estimated 24 (L) >60 mL/min    Comment: (NOTE) Calculated using the CKD-EPI Creatinine Equation (2021)    Anion gap 8 5 - 15    Comment: Performed at [facility], [place] [address], [place], [place] [address]  CBC with Differential     Status: Abnormal   Collection Time: 02/25/21 10:53 PM  Result Value Ref Range   WBC 11.8 (H) 4.0 - 10.5 K/uL   RBC 2.94 (L) 3.87 - 5.11 MIL/uL   Hemoglobin 8.8 (L) 12.0 - 15.0 g/dL   HCT 28.7 (L) 36.0 - 46.0 %   MCV 97.6 80.0 - 100.0 fL   MCH 29.9 26.0 - 34.0 pg   MCHC 30.7 30.0 - 36.0 g/dL   RDW 20.8 (H) 11.5 - 15.5 %   Platelets 291 150 - 400 K/uL   nRBC 0.0 0.0 - 0.2 %   Neutrophils Relative % 87 %   Neutro Abs 10.2 (H) 1.7 - 7.7 K/uL   Lymphocytes Relative 6 %   Lymphs Abs 0.7 0.7 - 4.0 K/uL   Monocytes Relative 6 %   Monocytes Absolute 0.7 0.1 - 1.0 K/uL   Eosinophils Relative 1 %   Eosinophils Absolute 0.1 0.0 - 0.5 K/uL   Basophils Relative 0 %   Basophils Absolute 0.1 0.0 - 0.1 K/uL   Immature Granulocytes 0 %   Abs Immature Granulocytes 0.04 0.00 - 0.07 K/uL    Comment: Performed at [facility], [place] [address] [place], [place] [address]  Lipase, blood     Status: None   Collection Time: 02/25/21 10:53 PM  Result Value Ref Range   Lipase 17 11 - 51 U/L    Comment: Performed at [facility], [place] [address] [place], [place] [address]  Type and screen [place]     Status: None   Collection Time: 02/25/21 10:53 PM  Result Value Ref Range   ABO/RH(D) O POS    Antibody Screen NEG    Sample Expiration      02/28/2021,2359 Performed at [facility], [place] [address], [place], [place] [address]   C Difficile Quick Screen w PCR reflex     Status: Abnormal   Collection Time: 02/26/21  4:25 AM   Specimen: STOOL  Result Value Ref Range   C Diff antigen POSITIVE (A) NEGATIVE   C Diff toxin POSITIVE (A) NEGATIVE   C Diff interpretation Toxin producing C. difficile detected.     Comment: CRITICAL RESULT CALLED TO, READ BACK BY AND VERIFIED WITH: Sharrell Ku RN Performed at  [facility], [place] [address], [place], [place] [address]   Lactoferrin, Fecal, Qualitative     Status: Abnormal   Collection Time: 02/26/21  4:25 AM  Result Value Ref Range   Lactoferrin, Fecal, Qual POSITIVE (A) NEGATIVE    Comment: Performed at [facility], [place] [place], [place] [address]  CBC     Status: Abnormal   Collection Time: 02/26/21  5:09 AM  Result Value Ref Range   WBC 13.4 (H) 4.0 - 10.5 K/uL   RBC 2.77 (L) 3.87 - 5.11 MIL/uL   Hemoglobin 8.3 (L) 12.0 - 15.0 g/dL   HCT 27.1 (L) 36.0 - 46.0 %   MCV 97.8 80.0 - 100.0 fL   MCH 30.0 26.0 - 34.0 pg   MCHC 30.6 30.0 - 36.0 g/dL   RDW 21.2 (H) 11.5 - 15.5 %   Platelets 278 150 - 400 K/uL   nRBC 0.0 0.0 - 0.2 %    Comment: Performed at [facility], [place] [address], [place], [place] [address]  Protime-INR     Status: Abnormal   Collection Time: 02/26/21  5:09 AM  Result Value Ref Range   Prothrombin Time 32.5 (H) 11.4 - 15.2 seconds   INR 3.2 (H) 0.8 - 1.2    Comment: (NOTE) INR goal varies based on device and disease states. Performed at [facility], [place] [address], [place], [place] [address]   APTT     Status: Abnormal   Collection Time: 02/26/21  5:09 AM  Result Value Ref Range   aPTT 59 (H)  24 - 36 seconds    Comment:        IF BASELINE aPTT IS ELEVATED, SUGGEST PATIENT RISK ASSESSMENT BE USED TO DETERMINE APPROPRIATE ANTICOAGULANT THERAPY. Performed at [facility], [place] [address], [place], [place] [address]   Comprehensive metabolic panel     Status: Abnormal   Collection Time: 02/26/21  5:09 AM  Result Value Ref Range   Sodium 136 135 - 145 mmol/L   Potassium 3.8 3.5 - 5.1 mmol/L   Chloride 98 98 - 111 mmol/L   CO2 31 22 - 32 mmol/L   Glucose, Bld 85 70 - 99 mg/dL    Comment: Glucose reference range applies only to samples taken after fasting for at least 8 hours.   BUN 9 8 - 23 mg/dL   Creatinine, Ser 2.27 (H) 0.44 - 1.00 mg/dL   Calcium 7.7 (L) 8.9 - 10.3 mg/dL   Total Protein 4.1 (L) 6.5 - 8.1 g/dL   Albumin 1.7 (L) 3.5 - 5.0 g/dL   AST 19 15 - 41 U/L   ALT 15 0 - 44 U/L   Alkaline Phosphatase 93 38 - 126 U/L   Total Bilirubin 1.0 0.3 - 1.2 mg/dL   GFR, Estimated 22 (L) >60 mL/min    Comment: (NOTE) Calculated using the CKD-EPI Creatinine Equation (2021)    Anion gap 7 5 - 15    Comment: Performed at [facility], [place] [address] [place], [place] [address]  Protime-INR     Status: Abnormal   Collection Time: 02/27/21  1:21 AM  Result Value Ref Range   Prothrombin Time 30.3 (H) 11.4 - 15.2 seconds   INR 2.9 (H) 0.8 - 1.2    Comment: (NOTE) INR goal varies based on device and disease states. Performed at [facility], [place] [address] [place], [place] [address]   CBC  Status: Abnormal   Collection Time: 02/27/21  1:21 AM  Result Value Ref Range   WBC 13.2 (H) 4.0 - 10.5 K/uL   RBC 2.68 (L) 3.87 - 5.11 MIL/uL   Hemoglobin 8.1 (L) 12.0 - 15.0 g/dL   HCT 25.9 (L) 36.0 - 46.0 %   MCV 96.6 80.0 - 100.0 fL   MCH 30.2 26.0 - 34.0 pg   MCHC 31.3 30.0 - 36.0 g/dL   RDW 21.9 (H) 11.5 - 15.5 %   Platelets 255 150 - 400 K/uL   nRBC 0.0 0.0 - 0.2 %    Comment: Performed at [place] [address] [place], [place] [address]  Reticulocytes     Status: Abnormal   Collection Time: 02/27/21  1:21 AM  Result Value Ref Range   Retic Ct Pct 3.5 (H) 0.4 - 3.1 %   RBC. 2.75 (L) 3.87 - 5.11 MIL/uL   Retic Count, Absolute 96.8 19.0 - 186.0 K/uL   Immature Retic Fract 23.8 (H) 2.3 - 15.9 %    Comment: Performed at [place] [address] [place], [place] [address]  Type and screen [place]     Status: None   Collection Time: 02/27/21 10:25 AM  Result Value Ref Range   ABO/RH(D) O POS    Antibody Screen NEG    Sample Expiration      03/02/2021,2359 Performed at [facility], [place] [address], [place],  [address]     Studies/Results: DG Chest Portable 1 View  Result Date: 02/25/2021 CLINICAL DATA:  Fatigue, weakness EXAM: PORTABLE CHEST 1 VIEW COMPARISON:  12/31/2020 FINDINGS: Right dialysis catheter remains in place, unchanged. Cardiomegaly with vascular congestion. Perihilar and lower lobe airspace disease, likely edema. Suspect layering effusions. IMPRESSION: Cardiomegaly with mild CHF.  Suspect layering effusions. Electronically Signed   By: Rolm Baptise M.D.   On: 02/25/2021 19:34    Medications: I have reviewed the patient's current medications.  Assessment: Recurrent C. Difficile, C. difficile toxin and antigen positive on 02/26/2021 Elevated fecal lactoferrin FOBT positive  Normocytic anemia, on IV heparin End-stage renal disease on hemodialysis, Tuesday, Thursday, Saturday Severe aortic stenosis Chronic diastolic congestive heart failure  A. fib, on Eliquis, PT 30.3, INR 2.9   Plan: Started on Dificid 200 mg twice a day from 02/26/2021, for total 10 days  Continue to hold Eliquis Continue to avoid PPI due to recurrent C. difficile and diarrhea  Initial plan for diagnostic endoscopy and colonoscopy (for anemia )on hold, do not want to worsen diarrhea with ongoing C. difficile infection  Clinically improving Will revisit on  Monday.   Ronnette Juniper, MD 02/27/2021, 1:39 PM

## 2021-02-27 NOTE — Progress Notes (Addendum)
Initial Nutrition Assessment  DOCUMENTATION CODES:   Not applicable  INTERVENTION:   Boost Breeze po TID, each supplement provides 250 kcal and 9 grams of protein Renal MVI daily  NUTRITION DIAGNOSIS:   Increased nutrient needs related to acute illness (with decreased appetite and intake, weight loss) as evidenced by estimated needs.  GOAL:   Patient will meet greater than or equal to 90% of their needs  MONITOR:   PO intake, Supplement acceptance, Skin, Labs  REASON FOR ASSESSMENT:   Consult Assessment of nutrition requirement/status, Poor PO  ASSESSMENT:   75 yo female admitted with progressively worsening diarrhea and weakness r/t recurrent C. difficile colitis. PMH includes ESRD on HD T-Th-Sat, severe aortic stenosis, CHF, C. difficile colonization, PAF, frequent UTIs, anemia, GERD.  Patient reports recent decreased appetite and intake related to diarrhea. Patient endorses some weight loss, but unsure of amount. She is currently on a soft diet, but reports ongoing poor intake. Meal intakes have not been recorded.   Labs reviewed.   Medications reviewed and include Dificid, ferric gluconate.  Weight history reviewed.  In the past 3 months, patient has had ~7.5% weight loss, which is severe.  NUTRITION - FOCUSED PHYSICAL EXAM:  Unable to complete  Diet Order:   Diet Order             DIET SOFT Room service appropriate? Yes; Fluid consistency: Thin  Diet effective now                   EDUCATION NEEDS:   Not appropriate for education at this time  Skin:  Skin Assessment: Skin Integrity Issues: Skin Integrity Issues:: Other (Comment) Other: Incontinence associated dermatitis to buttocks, Intertriginous dermatitis to coccyx  Last BM:  9/9  Height:   Ht Readings from Last 1 Encounters:  02/27/21 5\' 3"  (1.6 m)    Weight:   Wt Readings from Last 1 Encounters:  02/27/21 67.8 kg    BMI:  Body mass index is 26.48 kg/m.  Estimated Nutritional  Needs:   Kcal:  1850-2050  Protein:  90-110 gm  Fluid:  1.9-2.1 L    Lucas Mallow, RD, LDN, CNSC Please refer to Amion for contact information.

## 2021-02-27 NOTE — Plan of Care (Signed)

## 2021-02-27 NOTE — Progress Notes (Signed)
ANTICOAGULATION CONSULT NOTE - Initial Consult  Pharmacy Consult for Heparin Indication: atrial fibrillation   Allergies  Allergen Reactions   Astemizole Nausea And Vomiting   Fluorouracil Rash   Vicodin [Hydrocodone-Acetaminophen] Nausea And Vomiting   Chlorhexidine Rash    Sunburn    rash   Percocet [Oxycodone-Acetaminophen] Nausea And Vomiting    Patient Measurements: Height: 5\' 3"  (160 cm) Weight: 67.8 kg (149 lb 7.6 oz) IBW/kg (Calculated) : 52.4 Heparin Dosing Weight: 67.8 kg  Vital Signs: Temp: 97.8 F (36.6 C) (09/10 0904) Temp Source: Oral (09/10 0904) BP: 155/47 (09/10 0904) Pulse Rate: 72 (09/10 0904)  Labs: Recent Labs    02/25/21 2253 02/26/21 0509 02/27/21 0121  HGB 8.8* 8.3* 8.1*  HCT 28.7* 27.1* 25.9*  PLT 291 278 255  APTT  --  59*  --   LABPROT  --  32.5* 30.3*  INR  --  3.2* 2.9*  CREATININE 2.11* 2.27*  --     Estimated Creatinine Clearance: 19.5 mL/min (A) (by C-G formula based on SCr of 2.27 mg/dL (H)).   Medical History: Past Medical History:  Diagnosis Date   Arthritis of left knee    Cardiomyopathy ([place])    a. h/o LV dysfunction EF 20-25% in 2013 due to sepsis.>> improved to normal    Chronic diastolic CHF (congestive heart failure) ([place])    10/ 2013 in setting of septic shock   Complication of anesthesia    use a little anesthesia , per patient MD states she quit breathing (2016); hard to wake up   ESRD (end stage renal disease) ([place])    dialysis Tues, Thurs, Sat [address], sees dr deterding    GERD (gastroesophageal reflux disease)    Glaucoma    both eyes   H/O hiatal hernia    a. CT 2017: large gastric hiatal hernia.   History of blood transfusion 04/13/2015   History of kidney stones    10/18/2019: per patient "has a couple currently one in each kidney"   History of nephrostomy 04/11/2015   currently inplace 04/28/2015  removed now   Hyperlipidemia    Hypertension    medication removed from regimen due to low blood  pressure    Iron deficiency    hx   Nephrolithiasis 2002, 2006   bilateral   PAF (paroxysmal atrial fibrillation) ([place])    a. 10/ 2013  in setting of Septic Shock //  b. recurrent during admit for pneumonia, L effusion >> placed on Amiodarone // Coumadin for anticoagulation   Primary localized osteoarthritis of right hip 10/22/2019   S/P hemodialysis catheter insertion ([place]) 04/11/2015   right anterior chest , only used once    Sigmoid diverticulosis     Medications:  Scheduled:   amiodarone  200 mg Oral Daily   fidaxomicin  200 mg Oral BID   gabapentin  300 mg Oral QHS   Assessment: 75yoF on Eliquis 5mg  BID for Afib PTA. Hx of paroxysmal atrial fibrillation, [place] Vas 2 Score of 3. Initially presented to [facility] due to progressively worsening diarrhea and weakness. Hx of GIB. EGD planned for tomorrow. Will f/u plans to resume eliquis when appropriate.   Will use low therapeutic heparin goal Last dose of Eliquis on 9/8  Goal of Therapy:  Heparin level 0.3-0.7 units/ml Monitor platelets by anticoagulation protocol: Yes   Plan:  Start heparin infusion at 950 units/hr (14 units/kg/hr) HL ~8 hours after start of infusion Monitor daily heparin level and CBC  Lestine Box, PharmD  PGY2 Infectious Diseases Pharmacy Resident   Please check [email or web address] for unit-specific pharmacy phone numbers

## 2021-02-27 NOTE — Progress Notes (Signed)
[facility] Progress Note   Subjective:   Patient seen and examined at bedside.  Tolerating breakfast.  Diarrhea improved today.  Admits to weight loss recently and increasing edema.  Denies CP, SOB, orthopnea, n/v and abdominal pain.   Objective Vitals:   02/27/21 0057 02/27/21 0152 02/27/21 0603 02/27/21 0904  BP:  (!) 138/52 (!) 142/60 (!) 155/47  Pulse:  71 70 72  Resp:  16 16 17   Temp:  98 F (36.7 C) 98.2 F (36.8 C) 97.8 F (36.6 C)  TempSrc:  Oral Oral Oral  SpO2:  95% 94% 95%  Weight: 67.8 kg     Height: 5\' 3"  (1.6 m)      Physical Exam General:chronically ill appearing female in NAD QZRAQ:TMA,+2/6 systolic murmur Lungs:CTAB, breath sounds decreased in bases Abdomen:soft, NTND Extremities:2+ LE edema b/l Dialysis Access: TDC, LU AVF +b/t   Filed Weights   02/27/21 0057  Weight: 67.8 kg    Intake/Output Summary (Last 24 hours) at 02/27/2021 1415 Last data filed at 02/27/2021 0349 Gross per 24 hour  Intake 22.4 ml  Output --  Net 22.4 ml    Additional Objective Labs: Basic Metabolic Panel: Recent Labs  Lab 02/25/21 2253 02/26/21 0509  NA 137 136  K 3.7 3.8  CL 98 98  CO2 31 31  GLUCOSE 81 85  BUN 8 9  CREATININE 2.11* 2.27*  CALCIUM 7.7* 7.7*   Liver Function Tests: Recent Labs  Lab 02/25/21 2253 02/26/21 0509  AST 21 19  ALT 16 15  ALKPHOS 97 93  BILITOT 0.9 1.0  PROT 4.2* 4.1*  ALBUMIN 1.8* 1.7*   Recent Labs  Lab 02/25/21 2253  LIPASE 17   CBC: Recent Labs  Lab 02/25/21 2253 02/26/21 0509 02/27/21 0121  WBC 11.8* 13.4* 13.2*  NEUTROABS 10.2*  --   --   HGB 8.8* 8.3* 8.1*  HCT 28.7* 27.1* 25.9*  MCV 97.6 97.8 96.6  PLT 291 278 255    Studies/Results: DG Chest Portable 1 View  Result Date: 02/25/2021 CLINICAL DATA:  Fatigue, weakness EXAM: PORTABLE CHEST 1 VIEW COMPARISON:  12/31/2020 FINDINGS: Right dialysis catheter remains in place, unchanged. Cardiomegaly with vascular congestion. Perihilar and lower  lobe airspace disease, likely edema. Suspect layering effusions. IMPRESSION: Cardiomegaly with mild CHF.  Suspect layering effusions. Electronically Signed   By: Rolm Baptise M.D.   On: 02/25/2021 19:34    Medications:  sodium chloride Stopped (02/26/21 2332)   ferric gluconate (FERRLECIT) IVPB     heparin 950 Units/hr (02/27/21 1147)    amiodarone  200 mg Oral Daily   fidaxomicin  200 mg Oral BID   gabapentin  300 mg Oral QHS    Dialysis Orders: [facility] TTS  4h  400/500  68.5kg   2/2 bath P2  Hep none    AVF / TDC   - venofer 100mg  x 5, has had 2 doses   - mircera 150 q2, last 02/18/21, due 9/15       Assessment/ Plan: Acute LGIB - per GI/ pmd A/C diarrhea - recurrent Cdiff, + toxin/ antigen here. ID consulted, on dificid, avoiding other antibiotics and PPI if possible.  ESRD: TTS HD. HD today per regular schedule.  Hypertension/volume: Blood pressure elevated, not on medication.  Worsened edema on exam, very low albumin 1.7.  UF and lower dry as tolerated.  Standing weights if possible.  Anemia of ESRD: Hb mid 8's. cont IV iron loading here, 3 more doses. Next esa due on  1/89.    Metabolic bone disease: CorrCa 9, will check phos. Not on binders at home.  Hx of ESBL E.coli UTI - and other pathogens.  Recent L arm compartment syndrome - sp AVF revision / repair.  Currently refusing to use due to ongoing swelling around AVF. Chronic combined CHF Paroxysmal A-Fib - takes Eliquis/amiodarone. Severe aortic stenosis   Jen Mow, PA-C [facility] 02/27/2021,2:15 PM  LOS: 1 day

## 2021-02-27 NOTE — Progress Notes (Signed)
Subjective:  Only 1 bowel movement overnight   Antibiotics:  Anti-infectives (From admission, onward)    Start     Dose/Rate Route Frequency Ordered Stop   04/24/21 1000  vancomycin (VANCOCIN) capsule 125 mg  Status:  Discontinued       See Hyperspace for full Linked Orders Report.   125 mg Oral Every 3 DAYS 02/26/21 1159 02/26/21 1405   03/27/21 1000  vancomycin (VANCOCIN) capsule 125 mg  Status:  Discontinued       See Hyperspace for full Linked Orders Report.   125 mg Oral Every other day 02/26/21 1159 02/26/21 1405   03/20/21 1000  vancomycin (VANCOCIN) capsule 125 mg  Status:  Discontinued       See Hyperspace for full Linked Orders Report.   125 mg Oral Daily 02/26/21 1159 02/26/21 1405   03/12/21 2200  vancomycin (VANCOCIN) capsule 125 mg  Status:  Discontinued       See Hyperspace for full Linked Orders Report.   125 mg Oral 2 times daily 02/26/21 1159 02/26/21 1405   02/26/21 1415  fidaxomicin (DIFICID) tablet 200 mg        200 mg Oral 2 times daily 02/26/21 1405 03/08/21 0959   02/26/21 1400  vancomycin (VANCOCIN) capsule 125 mg  Status:  Discontinued       See Hyperspace for full Linked Orders Report.   125 mg Oral 4 times daily 02/26/21 1159 02/26/21 1405   02/26/21 0000  fidaxomicin (DIFICID) 200 MG TABS tablet       Note to Pharmacy: This is for a patient at [facility] and copay assistance already loaded in [place]   200 mg Oral 2 times daily 02/26/21 1408         Medications: Scheduled Meds:  amiodarone  200 mg Oral Daily   fidaxomicin  200 mg Oral BID   gabapentin  300 mg Oral QHS   Continuous Infusions:  sodium chloride Stopped (02/26/21 2332)   ferric gluconate (FERRLECIT) IVPB     heparin 950 Units/hr (02/27/21 1147)   PRN Meds:.acetaminophen **OR** acetaminophen, ondansetron (ZOFRAN) IV    Objective: Weight change:   Intake/Output Summary (Last 24 hours) at 02/27/2021 1248 Last data filed at 02/27/2021  0349 Gross per 24 hour  Intake 22.4 ml  Output --  Net 22.4 ml   Blood pressure (!) 155/47, pulse 72, temperature 97.8 F (36.6 C), temperature source Oral, resp. rate 17, height 5\' 3"  (1.6 m), weight 67.8 kg, last menstrual period 01/19/1992, SpO2 95 %. Temp:  [97.8 F (36.6 C)-98.3 F (36.8 C)] 97.8 F (36.6 C) (09/10 0904) Pulse Rate:  [67-80] 72 (09/10 0904) Resp:  [9-18] 17 (09/10 0904) BP: (121-179)/(47-60) 155/47 (09/10 0904) SpO2:  [92 %-97 %] 95 % (09/10 0904) Weight:  [67.8 kg] 67.8 kg (09/10 0057)  Physical Exam: Physical Exam Constitutional:      Appearance: She is well-developed. She is obese. She is not diaphoretic.  HENT:     Head: Normocephalic and atraumatic.     Right Ear: External ear normal.     Left Ear: External ear normal.     Mouth/Throat:     Pharynx: No oropharyngeal exudate.  Eyes:     General: No scleral icterus.    Conjunctiva/sclera: Conjunctivae normal.     Pupils: Pupils are equal, round, and reactive to light.  Cardiovascular:     Rate and Rhythm: Normal rate and regular rhythm.  Pulmonary:     Effort: Pulmonary effort is normal. No respiratory distress.     Breath sounds: No wheezing or rales.  Abdominal:     General: Bowel sounds are normal. There is no distension.     Palpations: Abdomen is soft. There is no mass.     Tenderness: There is no abdominal tenderness.     Hernia: No hernia is present.  Musculoskeletal:        General: No tenderness. Normal range of motion.  Lymphadenopathy:     Cervical: No cervical adenopathy.  Skin:    General: Skin is warm and dry.     Coloration: Skin is not pale.     Findings: No erythema or rash.  Neurological:     General: No focal deficit present.     Mental Status: She is alert and oriented to person, place, and time.     Motor: No abnormal muscle tone.     Coordination: Coordination normal.  Psychiatric:        Mood and Affect: Mood is anxious.        Behavior: Behavior normal.         Thought Content: Thought content normal.        Cognition and Memory: Memory is impaired.        Judgment: Judgment normal.     CBC:    BMET Recent Labs    02/25/21 2253 02/26/21 0509  NA 137 136  K 3.7 3.8  CL 98 98  CO2 31 31  GLUCOSE 81 85  BUN 8 9  CREATININE 2.11* 2.27*  CALCIUM 7.7* 7.7*     Liver Panel  Recent Labs    02/25/21 2253 02/26/21 0509  PROT 4.2* 4.1*  ALBUMIN 1.8* 1.7*  AST 21 19  ALT 16 15  ALKPHOS 97 93  BILITOT 0.9 1.0       Sedimentation Rate No results for input(s): ESRSEDRATE in the last 72 hours. C-Reactive Protein No results for input(s): CRP in the last 72 hours.  Micro Results: Recent Results (from the past 720 hour(s))  Resp Panel by RT-PCR (Flu A&B, Covid) Nasopharyngeal Swab     Status: None   Collection Time: 02/25/21  9:00 PM   Specimen: Nasopharyngeal Swab; Nasopharyngeal(NP) swabs in vial transport medium  Result Value Ref Range Status   SARS Coronavirus 2 by RT PCR NEGATIVE NEGATIVE Final    Comment: (NOTE) SARS-CoV-2 target nucleic acids are NOT DETECTED.  The SARS-CoV-2 RNA is generally detectable in upper respiratory specimens during the acute phase of infection. The lowest concentration of SARS-CoV-2 viral copies this assay can detect is 138 copies/mL. A negative result does not preclude SARS-Cov-2 infection and should not be used as the sole basis for treatment or other patient management decisions. A negative result may occur with  improper specimen collection/handling, submission of specimen other than nasopharyngeal swab, presence of viral mutation(s) within the areas targeted by this assay, and inadequate number of viral copies(<138 copies/mL). A negative result must be combined with clinical observations, patient history, and epidemiological information. The expected result is Negative.  Fact Sheet for Patients:  EntrepreneurPulse.com.au  Fact Sheet for Healthcare Providers:   IncredibleEmployment.be  This test is no t yet approved or cleared by the [place] FDA and  has been authorized for detection and/or diagnosis of SARS-CoV-2 by FDA under an Emergency Use Authorization (EUA). This EUA will remain  in effect (meaning this test can be used) for the duration of the COVID-19  declaration under Section 564(b)(1) of the Act, 21 U.S.C.section 360bbb-3(b)(1), unless the authorization is terminated  or revoked sooner.       Influenza A by PCR NEGATIVE NEGATIVE Final   Influenza B by PCR NEGATIVE NEGATIVE Final    Comment: (NOTE) The Xpert Xpress SARS-CoV-2/FLU/RSV plus assay is intended as an aid in the diagnosis of influenza from Nasopharyngeal swab specimens and should not be used as a sole basis for treatment. Nasal washings and aspirates are unacceptable for Xpert Xpress SARS-CoV-2/FLU/RSV testing.  Fact Sheet for Patients: EntrepreneurPulse.com.au  Fact Sheet for Healthcare Providers: IncredibleEmployment.be  This test is not yet approved or cleared by the [place] FDA and has been authorized for detection and/or diagnosis of SARS-CoV-2 by FDA under an Emergency Use Authorization (EUA). This EUA will remain in effect (meaning this test can be used) for the duration of the COVID-19 declaration under Section 564(b)(1) of the Act, 21 U.S.C. section 360bbb-3(b)(1), unless the authorization is terminated or revoked.  Performed at [facility], [place] [address] [place], [place] [address]   C Difficile Quick Screen w PCR reflex     Status: Abnormal   Collection Time: 02/26/21  4:25 AM   Specimen: STOOL  Result Value Ref Range Status   C Diff antigen POSITIVE (A) NEGATIVE Final   C Diff toxin POSITIVE (A) NEGATIVE Final   C Diff interpretation Toxin producing C. difficile detected.  Final    Comment: CRITICAL RESULT CALLED TO, READ BACK BY AND VERIFIED WITH: Sharrell Ku  RN Performed at [facility] [address] [place], [place] [address]     Studies/Results: DG Chest Portable 1 View  Result Date: 02/25/2021 CLINICAL DATA:  Fatigue, weakness EXAM: PORTABLE CHEST 1 VIEW COMPARISON:  12/31/2020 FINDINGS: Right dialysis catheter remains in place, unchanged. Cardiomegaly with vascular congestion. Perihilar and lower lobe airspace disease, likely edema. Suspect layering effusions. IMPRESSION: Cardiomegaly with mild CHF.  Suspect layering effusions. Electronically Signed   By: Rolm Baptise M.D.   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 female with  ESRD on HD with recurrent Clostridium difficile colitis also with blood per rectum  #1 Clostridium difficile colitis --continue dificid --avoid other antibiotics --IF possible avoid PPI  #2 Blood per rectum: prep for colonoscopy is certainly  going to worsen her diarrhea  #3 PAF: on heparin at present     LOS: 1 day   Alcide Evener 02/27/2021, 12:48 PM

## 2021-02-27 NOTE — Progress Notes (Signed)
PROGRESS NOTE                                                                                                                                                                                                             Patient Demographics:    [name], is a 75 y.o. female, DOB - [birth year], IHK:[id number]  Outpatient Primary MD for the patient is Lajean Manes, MD    LOS - 1  Admit date - 02/25/2021    CC - weakness     Brief Narrative (HPI from H&P)  75 year old female with past medical history of end-stage renal disease (HD tues, thurs, sat), severe aortic stenosis, chronic diastolic congestive heart failure (Echo 09/2020 EF 55-60% with G2DD), C. difficile colonization, paroxysmal atrial fibrillation (on eliquis), frequent urinary tract infections on suppression therapy with Keflex with recent ESBL Proteus UTI  (12/2020), anemia of chronic disease, gastroesophageal reflux disease who presents to [facility] emergency department due to progressively worsening diarrhea and weakness, in the ER work-up suggestive of recurrent C. Difficile infection, acute on chronic anemia.  GI and ID were consulted and she was admitted to the hospital.   Subjective:    [name] today has, No headache, No chest pain, No abdominal pain - No Nausea, No new weakness tingling or numbness, no shortness of breath, diarrhea has improved.   Assessment  & Plan :   Acute on Chr. C Diff infection - she has had multiple previous episodes of C. difficile infection, GI and ID on board, currently she is on Dificid, will monitor.  Overall diarrhea has improved.  2.  Acute on chronic anemia.  Some question of rectal bleeding.  Likely due to excessive wiping from diarrhea, H&H currently appears stable, will try to monitor over heparin drip.  Check anemia panel and monitor H&H.  3.  History of paroxysmal atrial fibrillation [place] vas 2 score of  greater than 3.  Continue combination of amiodarone, heparin instead of Eliquis for now.  4.  ESRD.  On TTS schedule.  Nephrology on board.  5.  History of ESBL UTI in the past.  Monitor.  6.  History of severe aortic stenosis.  No acute issues will monitor will avoid large drops in preload or hypotension.  7.  Chronic diastolic CHF.  EF 55% on echocardiogram in April 2020.  Currently compensated.  8.  Severe PCM.  Nutritionist following supplements as needed.  9.  Generalized weakness.  Due to ongoing C. difficile infection and diarrhea.  PT OT and supportive care.       Condition - Extremely Guarded  Family Communication  : None present  Code Status : Full code  Consults  : GI, nephrology, ID  PUD Prophylaxis : None   Procedures  :           Disposition Plan  :    Status is: Inpatient  Remains inpatient appropriate because:IV treatments appropriate due to intensity of illness or inability to take PO  Dispo: The patient is from: Home              Anticipated d/c is to: Home              Patient currently is not medically stable to d/c.   Difficult to place patient No  DVT Prophylaxis  :    SCDs Start: 02/26/21 0421    Lab Results  Component Value Date   PLT 255 02/27/2021    Diet :  Diet Order             DIET SOFT Room service appropriate? Yes; Fluid consistency: Thin  Diet effective now                    Inpatient Medications  Scheduled Meds:  amiodarone  200 mg Oral Daily   fidaxomicin  200 mg Oral BID   gabapentin  300 mg Oral QHS   Continuous Infusions:  sodium chloride Stopped (02/26/21 2332)   ferric gluconate (FERRLECIT) IVPB     PRN Meds:.acetaminophen **OR** acetaminophen, ondansetron (ZOFRAN) IV  Antibiotics  :    Anti-infectives (From admission, onward)    Start     Dose/Rate Route Frequency Ordered Stop   04/24/21 1000  vancomycin (VANCOCIN) capsule 125 mg  Status:  Discontinued       See Hyperspace for full Linked  Orders Report.   125 mg Oral Every 3 DAYS 02/26/21 1159 02/26/21 1405   03/27/21 1000  vancomycin (VANCOCIN) capsule 125 mg  Status:  Discontinued       See Hyperspace for full Linked Orders Report.   125 mg Oral Every other day 02/26/21 1159 02/26/21 1405   03/20/21 1000  vancomycin (VANCOCIN) capsule 125 mg  Status:  Discontinued       See Hyperspace for full Linked Orders Report.   125 mg Oral Daily 02/26/21 1159 02/26/21 1405   03/12/21 2200  vancomycin (VANCOCIN) capsule 125 mg  Status:  Discontinued       See Hyperspace for full Linked Orders Report.   125 mg Oral 2 times daily 02/26/21 1159 02/26/21 1405   02/26/21 1415  fidaxomicin (DIFICID) tablet 200 mg        200 mg Oral 2 times daily 02/26/21 1405 03/08/21 0959   02/26/21 1400  vancomycin (VANCOCIN) capsule 125 mg  Status:  Discontinued       See Hyperspace for full Linked Orders Report.   125 mg Oral 4 times daily 02/26/21 1159 02/26/21 1405   02/26/21 0000  fidaxomicin (DIFICID) 200 MG TABS tablet       Note to Pharmacy: This is for a patient at [facility] and copay assistance already loaded in [facility]   200 mg Oral 2 times daily 02/26/21 1408          Time  Spent in minutes  30   [name] on 02/27/2021 at 10:08 AM  To page go to [email or web address]   [facility] -  Office  [phone]    See all Orders from today for further details    Objective:   Vitals:   02/27/21 0057 02/27/21 0152 02/27/21 0603 02/27/21 0904  BP:  (!) 138/52 (!) 142/60 (!) 155/47  Pulse:  71 70 72  Resp:  16 16 17   Temp:  98 F (36.7 C) 98.2 F (36.8 C) 97.8 F (36.6 C)  TempSrc:  Oral Oral Oral  SpO2:  95% 94% 95%  Weight: 67.8 kg     Height: 5\' 3"  (1.6 m)       Wt Readings from Last 3 Encounters:  02/27/21 67.8 kg  01/09/21 66.5 kg  12/02/20 73.1 kg     Intake/Output Summary (Last 24 hours) at 02/27/2021 1008 Last data filed at 02/27/2021 0349 Gross per 24 hour  Intake 22.4 ml  Output  --  Net 22.4 ml     Physical Exam  Awake Alert, No new F.N deficits, Normal affect [place].AT,PERRAL Supple Neck,No JVD, No cervical lymphadenopathy appriciated.  Symmetrical Chest wall movement, Good air movement bilaterally, CTAB RRR,No Gallops, chronic systolic aortic murmur +ve B.Sounds, Abd Soft, No tenderness, No organomegaly appriciated, No rebound - guarding or rigidity. No Cyanosis, Clubbing or edema, No new Rash or bruise     Data Review:    CBC Recent Labs  Lab 02/25/21 2253 02/26/21 0509 02/27/21 0121  WBC 11.8* 13.4* 13.2*  HGB 8.8* 8.3* 8.1*  HCT 28.7* 27.1* 25.9*  PLT 291 278 255  MCV 97.6 97.8 96.6  MCH 29.9 30.0 30.2  MCHC 30.7 30.6 31.3  RDW 20.8* 21.2* 21.9*  LYMPHSABS 0.7  --   --   MONOABS 0.7  --   --   EOSABS 0.1  --   --   BASOSABS 0.1  --   --     Recent Labs  Lab 02/25/21 2253 02/26/21 0509 02/27/21 0121  NA 137 136  --   K 3.7 3.8  --   CL 98 98  --   CO2 31 31  --   GLUCOSE 81 85  --   BUN 8 9  --   CREATININE 2.11* 2.27*  --   CALCIUM 7.7* 7.7*  --   AST 21 19  --   ALT 16 15  --   ALKPHOS 97 93  --   BILITOT 0.9 1.0  --   ALBUMIN 1.8* 1.7*  --   INR  --  3.2* 2.9*    ------------------------------------------------------------------------------------------------------------------ No results for input(s): CHOL, HDL, LDLCALC, TRIG, CHOLHDL, LDLDIRECT in the last 72 hours.  Lab Results  Component Value Date   HGBA1C 5.6 11/13/2014   ------------------------------------------------------------------------------------------------------------------ No results for input(s): TSH, T4TOTAL, T3FREE, THYROIDAB in the last 72 hours.  Invalid input(s): FREET3  Cardiac Enzymes No results for input(s): CKMB, TROPONINI, MYOGLOBIN in the last 168 hours.  Invalid input(s): CK ------------------------------------------------------------------------------------------------------------------    Component Value Date/Time   BNP 2,447.3  (H) 06/08/2020 0525    Radiology Reports DG Chest Portable 1 View  Result Date: 02/25/2021 CLINICAL DATA:  Fatigue, weakness EXAM: PORTABLE CHEST 1 VIEW COMPARISON:  12/31/2020 FINDINGS: Right dialysis catheter remains in place, unchanged. Cardiomegaly with vascular congestion. Perihilar and lower lobe airspace disease, likely edema. Suspect layering effusions. IMPRESSION: Cardiomegaly with mild CHF.  Suspect layering effusions. Electronically Signed   By: Rolm Baptise  Reilly.   On: 02/25/2021 19:34

## 2021-02-28 ENCOUNTER — Encounter (HOSPITAL_COMMUNITY)
Admission: EM | Disposition: A | Discharge status: Home Health | Payer: Self-pay | Source: Home / Self Care | Attending: Internal Medicine

## 2021-02-28 DIAGNOSIS — I5032 Chronic diastolic (congestive) heart failure: Secondary | ICD-10-CM | POA: Diagnosis not present

## 2021-02-28 DIAGNOSIS — K529 Noninfective gastroenteritis and colitis, unspecified: Secondary | ICD-10-CM

## 2021-02-28 DIAGNOSIS — K922 Gastrointestinal hemorrhage, unspecified: Secondary | ICD-10-CM | POA: Diagnosis not present

## 2021-02-28 DIAGNOSIS — K625 Hemorrhage of anus and rectum: Secondary | ICD-10-CM

## 2021-02-28 DIAGNOSIS — I480 Paroxysmal atrial fibrillation: Secondary | ICD-10-CM | POA: Diagnosis not present

## 2021-02-28 LAB — COMPREHENSIVE METABOLIC PANEL
ALT: 12 U/L (ref 0–44)
AST: 16 U/L (ref 15–41)
Albumin: 1.4 g/dL — ABNORMAL LOW (ref 3.5–5.0)
Alkaline Phosphatase: 80 U/L (ref 38–126)
Anion gap: 6 (ref 5–15)
BUN: 12 mg/dL (ref 8–23)
CO2: 29 mmol/L (ref 22–32)
Calcium: 7.7 mg/dL — ABNORMAL LOW (ref 8.9–10.3)
Chloride: 101 mmol/L (ref 98–111)
Creatinine, Ser: 3.13 mg/dL — ABNORMAL HIGH (ref 0.44–1.00)
GFR, Estimated: 15 mL/min — ABNORMAL LOW (ref >60)
Glucose, Bld: 112 mg/dL — ABNORMAL HIGH (ref 70–99)
Potassium: 4.2 mmol/L (ref 3.5–5.1)
Sodium: 136 mmol/L (ref 135–145)
Total Bilirubin: 0.9 mg/dL (ref 0.3–1.2)
Total Protein: 4.1 g/dL — ABNORMAL LOW (ref 6.5–8.1)

## 2021-02-28 LAB — CBC WITH DIFFERENTIAL/PLATELET
Abs Immature Granulocytes: 0.08 10*3/uL — ABNORMAL HIGH (ref 0.00–0.07)
Basophils Absolute: 0.1 10*3/uL (ref 0.0–0.1)
Basophils Relative: 0 %
Eosinophils Absolute: 0.2 10*3/uL (ref 0.0–0.5)
Eosinophils Relative: 1 %
HCT: 28.8 % — ABNORMAL LOW (ref 36.0–46.0)
Hemoglobin: 8.9 g/dL — ABNORMAL LOW (ref 12.0–15.0)
Immature Granulocytes: 1 %
Lymphocytes Relative: 4 %
Lymphs Abs: 0.7 10*3/uL (ref 0.7–4.0)
MCH: 29.8 pg (ref 26.0–34.0)
MCHC: 30.9 g/dL (ref 30.0–36.0)
MCV: 96.3 fL (ref 80.0–100.0)
Monocytes Absolute: 1 10*3/uL (ref 0.1–1.0)
Monocytes Relative: 6 %
Neutro Abs: 14.7 10*3/uL — ABNORMAL HIGH (ref 1.7–7.7)
Neutrophils Relative %: 88 %
Platelets: 224 10*3/uL (ref 150–400)
RBC: 2.99 MIL/uL — ABNORMAL LOW (ref 3.87–5.11)
RDW: 22.1 % — ABNORMAL HIGH (ref 11.5–15.5)
WBC: 16.7 10*3/uL — ABNORMAL HIGH (ref 4.0–10.5)
nRBC: 0 % (ref 0.0–0.2)

## 2021-02-28 LAB — APTT
aPTT: >200 seconds (ref 24–36)
aPTT: 75 seconds — ABNORMAL HIGH (ref 24–36)

## 2021-02-28 LAB — BRAIN NATRIURETIC PEPTIDE: B Natriuretic Peptide: 1688.3 pg/mL — ABNORMAL HIGH (ref 0.0–100.0)

## 2021-02-28 LAB — PROTIME-INR
INR: 2.7 — ABNORMAL HIGH (ref 0.8–1.2)
Prothrombin Time: 28.3 seconds — ABNORMAL HIGH (ref 11.4–15.2)

## 2021-02-28 LAB — MAGNESIUM: Magnesium: 1.6 mg/dL — ABNORMAL LOW (ref 1.7–2.4)

## 2021-02-28 LAB — HEPARIN LEVEL (UNFRACTIONATED): Heparin Unfractionated: >1.1 IU/mL — ABNORMAL HIGH (ref 0.30–0.70)

## 2021-02-28 SURGERY — EGD (ESOPHAGOGASTRODUODENOSCOPY)
Anesthesia: Monitor Anesthesia Care

## 2021-02-28 MED ORDER — HEPARIN (PORCINE) 25000 UT/250ML-% IV SOLN
650.0000 [IU]/h | INTRAVENOUS | Status: DC
  Filled 2021-02-28: qty 250

## 2021-02-28 MED ORDER — MAGNESIUM SULFATE 2 GM/50ML IV SOLN
2.0000 g | Freq: Once | INTRAVENOUS | Status: AC
  Administered 2021-02-28: 2 g via INTRAVENOUS
  Filled 2021-02-28: qty 50

## 2021-02-28 MED ORDER — APIXABAN 5 MG PO TABS
5.0000 mg | ORAL_TABLET | Freq: Two times a day (BID) | ORAL | Status: DC
Start: 2021-02-28 — End: 2021-03-03
  Administered 2021-02-28 – 2021-03-03 (×7): 5 mg via ORAL
  Filled 2021-02-28 (×7): qty 1

## 2021-02-28 NOTE — Progress Notes (Signed)
[name] Progress Note   Subjective:   Patient seen and examined at bedside.  Feeling better today, diarrhea improving.  Dialysis yesterday tolerated well.  Denies CP, SOB, n/v and abdominal pain.  Objective Vitals:   02/27/21 1717 02/27/21 1758 02/27/21 2144 02/28/21 0833  BP: (!) 158/62 (!) 153/52 (!) 148/50 (!) 159/43  Pulse: 72 71 69 70  Resp:  17 18 17   Temp: (!) 97.2 F (36.2 C) 97.8 F (36.6 C) 98.1 F (36.7 C) 97.7 F (36.5 C)  TempSrc: Oral Oral Oral Oral  SpO2: 94% 97% 95% 94%  Weight: 65 kg     Height:       Physical Exam General:chronically ill appearing female in NAD Heart:RRR, +2/2 systolic murmur Lungs:CTAB, breath sounds decreased in bases, nml WOB on RA Abdomen:soft, NTND Extremities:1+ LE edema Dialysis Access: TDC, LU AVF +b/t   Filed Weights   02/27/21 0057 02/27/21 1410 02/27/21 1717  Weight: 67.8 kg 67.7 kg 65 kg    Intake/Output Summary (Last 24 hours) at 02/28/2021 1013 Last data filed at 02/27/2021 1754 Gross per 24 hour  Intake 57.61 ml  Output 2500 ml  Net -2442.39 ml    Additional Objective Labs: Basic Metabolic Panel: Recent Labs  Lab 02/25/21 2253 02/26/21 0509 02/27/21 1431  NA 137 136 135  K 3.7 3.8 4.3  CL 98 98 101  CO2 31 31 29   GLUCOSE 81 85 122*  BUN 8 9 13   CREATININE 2.11* 2.27* 3.51*  CALCIUM 7.7* 7.7* 7.6*  PHOS  --   --  2.7   Liver Function Tests: Recent Labs  Lab 02/25/21 2253 02/26/21 0509 02/27/21 1431  AST 21 19  --   ALT 16 15  --   ALKPHOS 97 93  --   BILITOT 0.9 1.0  --   PROT 4.2* 4.1*  --   ALBUMIN 1.8* 1.7* 1.5*   Recent Labs  Lab 02/25/21 2253  LIPASE 17   CBC: Recent Labs  Lab 02/25/21 2253 02/26/21 0509 02/27/21 0121  WBC 11.8* 13.4* 13.2*  NEUTROABS 10.2*  --   --   HGB 8.8* 8.3* 8.1*  HCT 28.7* 27.1* 25.9*  MCV 97.6 97.8 96.6  PLT 291 278 255   Iron Studies:  Recent Labs    02/27/21 1122  IRON 26*  TIBC QUANTITY NOT SUFFICIENT, UNABLE TO PERFORM  TEST  FERRITIN 1,081*    Medications:  sodium chloride Stopped (02/26/21 2332)   ferric gluconate (FERRLECIT) IVPB 62.5 mg (02/27/21 1643)   heparin 650 Units/hr (02/28/21 0239)    amiodarone  200 mg Oral Daily   feeding supplement  1 Container Oral TID BM   fidaxomicin  200 mg Oral BID   gabapentin  300 mg Oral QHS   multivitamin  1 tablet Oral QHS    Dialysis Orders: [facility] TTS  4h  400/500  68.5kg   2/2 bath P2  Hep none    AVF / TDC   - venofer 100mg  x 5, has had 2 doses   - mircera 150 q2, last 02/18/21, due 9/15       Assessment/ Plan: Acute LGIB - per GI/ pmd A/C diarrhea - recurrent Cdiff, + toxin/ antigen here. ID consulted, on dificid, avoiding other antibiotics and PPI if possible. Improving.  ESRD: TTS HD. HD yesterday tolerated well.  Next HD on 03/02/21. Hypertension/volume: Blood pressure elevated, not on medication at home.  Continues to have LE edema, although improving. Albumin very low.  Continue to  UF and lower dry as tolerated.  Standing weights if possible. 3.5kg under dry if weights correct post HD yesterday.  Anemia of ESRD: Hgb 8.1. cont IV iron loading here, 3 more doses. Next esa due on 9/15.    Metabolic bone disease: Corrected Ca and phos in goal. Not on binders or VDRA.  Hx of ESBL E.coli UTI - and other pathogens.  Recent L arm compartment syndrome - sp AVF revision / repair.  Currently refusing to use due to ongoing swelling around AVF. Chronic combined CHF Paroxysmal A-Fib - takes Eliquis/amiodarone. Severe aortic stenosis   Jen Mow, PA-C [facility] 02/28/2021,10:13 AM  LOS: 2 days

## 2021-02-28 NOTE — Progress Notes (Signed)
ANTICOAGULATION CONSULT NOTE   Pharmacy Consult for Heparin Indication: atrial fibrillation   Allergies  Allergen Reactions   Astemizole Nausea And Vomiting   Fluorouracil Rash   Vicodin [Hydrocodone-Acetaminophen] Nausea And Vomiting   Chlorhexidine Rash    Sunburn    rash   Percocet [Oxycodone-Acetaminophen] Nausea And Vomiting    Patient Measurements: Height: 5\' 3"  (160 cm) Weight: 65 kg (143 lb 4.8 oz) IBW/kg (Calculated) : 52.4 Heparin Dosing Weight: 67.8 kg  Vital Signs: Temp: 98.1 F (36.7 C) (09/10 2144) Temp Source: Oral (09/10 2144) BP: 148/50 (09/10 2144) Pulse Rate: 69 (09/10 2144)  Labs: Recent Labs    02/25/21 2253 02/26/21 0509 02/27/21 0121 02/27/21 1431 02/27/21 2019 02/27/21 2245  HGB 8.8* 8.3* 8.1*  --   --   --   HCT 28.7* 27.1* 25.9*  --   --   --   PLT 291 278 255  --   --   --   APTT  --  59*  --   --  >200* >200*  LABPROT  --  32.5* 30.3*  --   --   --   INR  --  3.2* 2.9*  --   --   --   HEPARINUNFRC  --   --   --   --  >1.10*  --   CREATININE 2.11* 2.27*  --  3.51*  --   --      Estimated Creatinine Clearance: 12.4 mL/min (A) (by C-G formula based on SCr of 3.51 mg/dL (H)).   Medical History: Past Medical History:  Diagnosis Date   Arthritis of left knee    Cardiomyopathy ([place])    a. h/o LV dysfunction EF 20-25% in 2013 due to sepsis.>> improved to normal    Chronic diastolic CHF (congestive heart failure) ([place])    10/ 2013 in setting of septic shock   Complication of anesthesia    use a little anesthesia , per patient MD states she quit breathing (2016); hard to wake up   ESRD (end stage renal disease) ([place])    dialysis Tues, Thurs, Sat [address], sees dr deterding    GERD (gastroesophageal reflux disease)    Glaucoma    both eyes   H/O hiatal hernia    a. CT 2017: large gastric hiatal hernia.   History of blood transfusion 04/13/2015   History of kidney stones    10/18/2019: per patient "has a couple currently one in  each kidney"   History of nephrostomy 04/11/2015   currently inplace 04/28/2015  removed now   Hyperlipidemia    Hypertension    medication removed from regimen due to low blood pressure    Iron deficiency    hx   Nephrolithiasis 2002, 2006   bilateral   PAF (paroxysmal atrial fibrillation) ([place])    a. 10/ 2013  in setting of Septic Shock //  b. recurrent during admit for pneumonia, L effusion >> placed on Amiodarone // Coumadin for anticoagulation   Primary localized osteoarthritis of right hip 10/22/2019   S/P hemodialysis catheter insertion ([place]) 04/11/2015   right anterior chest , only used once    Sigmoid diverticulosis     Medications:  Scheduled:   amiodarone  200 mg Oral Daily   feeding supplement  1 Container Oral TID BM   fidaxomicin  200 mg Oral BID   gabapentin  300 mg Oral QHS   multivitamin  1 tablet Oral QHS   Assessment: 75yoF on Eliquis 5mg  BID  for Afib PTA. Hx of paroxysmal atrial fibrillation, [place] Vas 2 Score of 3. Initially presented to [facility] due to progressively worsening diarrhea and weakness. Hx of GIB. EGD planned for tomorrow. Will f/u plans to resume eliquis when appropriate.   Heparin level and aptt have been very elevated >1 and >200s respectively. Labs were verified with foot stick. Rn also noting rectal bleeding. Instructed her to hold heparin for 2 hours then restart at lower rate.   Goal of Therapy:  Aptt goal 66-102s Heparin level 0.3-0.7 units/ml Monitor platelets by anticoagulation protocol: Yes   Plan:  Hold heparin x 2 hours then restart at 650 units/hr HL ~8 hours after restart of infusion Monitor daily heparin level and CBC  Erin Hearing PharmD., BCPS Clinical Pharmacist 02/28/2021 12:14 AM

## 2021-02-28 NOTE — Evaluation (Signed)
Occupational Therapy Evaluation Patient Details Name: [name] MRN: [id number] DOB: 08/11/1945 Today's Date: 02/28/2021    History of Present Illness The pt is a 75 y.o. female  presenting c/o weakness, lethargy. She has had diahrehea and bloody stools for abolut a week now. She is on HD. PMH includes: recent admission for LUE hematoma with concern for compartment syndrome, arthritis, cardiomyopathy, CHF, ESRD, GERD, HLD, HTN, MI, a fib, L THA and L TKA.   Clinical Impression   Jakaria was evaluated s/p the above admission list. PTA she required assist for ADLs and for functional slideboard transfers. Pt lives with daughter who is per primary care giver. Upon evaluation pt was mod A overall for bed mobility and required max verbal cues for sequencing and problem solving. She is currently requiring up to total A for ADLs at bed level however this is close to her baseline. Sh eis limited by generalized weakness, pain and poor activity tolerance. Pt would benefit from continued OT acutely. Recommend d/c to home if daughter can continue to provide necessary level of care.     Follow Up Recommendations  Home health OT;Supervision/Assistance - 24 hour (If pt's daughter is able to provide pt's current level of care, pt home with [facility]. If not, pt will like need SNF level care at d/c.)    Equipment Recommendations  None recommended by OT       Precautions / Restrictions Precautions Precautions: Fall Restrictions Weight Bearing Restrictions: No      Mobility Bed Mobility Overal bed mobility: Needs Assistance Bed Mobility: Rolling;Sidelying to Sit;Sit to Supine Rolling: Min assist Sidelying to sit: Min assist   Sit to supine: Mod assist   General bed mobility comments: min A for rolling, mod A to assist BLE back into bed    Transfers Overall transfer level: Needs assistance               General transfer comment: pt declined OOB transfer this session; uses slideboard at  baseline    Balance                                           ADL either performed or assessed with clinical judgement   ADL Overall ADL's : Needs assistance/impaired Eating/Feeding: Independent;Sitting   Grooming: Set up;Sitting   Upper Body Bathing: Moderate assistance;Sitting   Lower Body Bathing: Maximal assistance;Bed level Lower Body Bathing Details (indicate cue type and reason): LB at bed level at baseline Upper Body Dressing : Minimal assistance;Sitting   Lower Body Dressing: Maximal assistance;Bed level Lower Body Dressing Details (indicate cue type and reason): bed level at baseline Toilet Transfer: Total assistance Toilet Transfer Details (indicate cue type and reason): total A at baseline Toileting- Clothing Manipulation and Hygiene: Total assistance;Bed level Toileting - Clothing Manipulation Details (indicate cue type and reason): total A at baseline     Functional mobility during ADLs: Moderate assistance (bed mobility and scooting only this session) General ADL Comments: pt unable to control bowel or bladder at baseline, wear briefs & daughter completes hygiene     Vision   Vision Assessment?: No apparent visual deficits     Perception     Praxis      Pertinent Vitals/Pain Pain Assessment: Faces Faces Pain Scale: Hurts a little bit Pain Location: bilat feet Pain Descriptors / Indicators: Aching;Grimacing;Guarding Pain Intervention(s): Limited activity within patient's tolerance;Monitored during session  Hand Dominance Right   Extremity/Trunk Assessment Upper Extremity Assessment Upper Extremity Assessment: Generalized weakness (limited BUE ROM)   Lower Extremity Assessment Lower Extremity Assessment: Defer to PT evaluation   Cervical / Trunk Assessment Cervical / Trunk Assessment: Normal   Communication Communication Communication: No difficulties   Cognition Arousal/Alertness: Awake/alert Behavior During Therapy:  WFL for tasks assessed/performed Overall Cognitive Status: Within Functional Limits for tasks assessed                                     General Comments  pt complainting of bilat foot painthroughout session    Exercises     Shoulder Instructions      Home Living Family/patient expects to be discharged to:: Private residence Living Arrangements: Children Available Help at Discharge: Family;Available 24 hours/day Type of Home: House Home Access: Stairs to enter     Home Layout: One level     Bathroom Shower/Tub: Teacher, early years/pre: Handicapped height Bathroom Accessibility: No   Home Equipment: Grab bars - tub/shower;Walker - 4 wheels;Cane - quad;Cane - single point;Wheelchair - [employer] - 2 wheels          Prior Functioning/Environment Level of Independence: Needs assistance  Gait / Transfers Assistance Needed: Patient reports she was using a slide board to get to her wheelchair ADL's / Homemaking Assistance Needed: daughter assist with bathing (LB in bed, pt reports able to wash face and UE), LB dressing, and all IADLs.            OT Problem List: Decreased strength;Decreased range of motion;Decreased activity tolerance;Impaired balance (sitting and/or standing);Decreased safety awareness;Pain      OT Treatment/Interventions: Self-care/ADL training;Therapeutic exercise;Patient/family education;Therapeutic activities;DME and/or AE instruction    OT Goals(Current goals can be found in the care plan section) Acute Rehab OT Goals Patient Stated Goal: to feel better OT Goal Formulation: With patient Potential to Achieve Goals: Fair ADL Goals Pt Will Perform Grooming: with modified independence;sitting Pt Will Perform Upper Body Bathing: with supervision;sitting Pt Will Perform Lower Body Dressing: with mod assist;sitting/lateral leans Pt/caregiver will Perform Home Exercise Program: Increased strength;Both right and left upper  extremity;With written HEP provided;With Supervision  OT Frequency: Min 2X/week   Barriers to D/C:            Co-evaluation              AM-PAC OT "6 Clicks" Daily Activity     Outcome Measure Help from another person eating meals?: A Little Help from another person taking care of personal grooming?: A Little Help from another person toileting, which includes using toliet, bedpan, or urinal?: Total Help from another person bathing (including washing, rinsing, drying)?: A Lot Help from another person to put on and taking off regular upper body clothing?: A Little Help from another person to put on and taking off regular lower body clothing?: Total 6 Click Score: 13   End of Session Equipment Utilized During Treatment: Oxygen Nurse Communication: Mobility status;Precautions  Activity Tolerance: Patient tolerated treatment well;Patient limited by fatigue Patient left: in bed;with call bell/phone within reach;with bed alarm set  OT Visit Diagnosis: Unsteadiness on feet (R26.81);Other abnormalities of gait and mobility (R26.89);Muscle weakness (generalized) (M62.81);Pain                Time: 2376-2831 OT Time Calculation (min): 20 min Charges:  OT General Charges $OT Visit: 1 Visit OT Evaluation $OT Eval  Moderate Complexity: 1 Mod   Fredick Schlosser A Brei Pociask 02/28/2021, 3:55 PM

## 2021-02-28 NOTE — Progress Notes (Signed)
ANTICOAGULATION CONSULT NOTE - Initial Consult  Pharmacy Consult for Eliquis Indication: atrial fibrillation  Allergies  Allergen Reactions   Astemizole Nausea And Vomiting   Fluorouracil Rash   Vicodin [Hydrocodone-Acetaminophen] Nausea And Vomiting   Chlorhexidine Rash    Sunburn    rash   Percocet [Oxycodone-Acetaminophen] Nausea And Vomiting    Patient Measurements: Height: 5\' 3"  (160 cm) Weight: 65 kg (143 lb 4.8 oz) IBW/kg (Calculated) : 52.4   Vital Signs: Temp: 97.7 F (36.5 C) (09/11 0833) Temp Source: Oral (09/11 0833) BP: 159/43 (09/11 0833) Pulse Rate: 70 (09/11 0833)  Labs: Recent Labs    02/26/21 0509 02/27/21 0121 02/27/21 1431 02/27/21 2019 02/27/21 2245 02/28/21 0958  HGB 8.3* 8.1*  --   --   --  8.9*  HCT 27.1* 25.9*  --   --   --  28.8*  PLT 278 255  --   --   --  224  APTT 59*  --   --  >200* >200*  --   LABPROT 32.5* 30.3*  --   --   --   --   INR 3.2* 2.9*  --   --   --   --   HEPARINUNFRC  --   --   --  >1.10*  --  >1.10*  CREATININE 2.27*  --  3.51*  --   --  3.13*    Estimated Creatinine Clearance: 13.9 mL/min (A) (by C-G formula based on SCr of 3.13 mg/dL (H)).   Medical History: Past Medical History:  Diagnosis Date   Arthritis of left knee    Cardiomyopathy ([place])    a. h/o LV dysfunction EF 20-25% in 2013 due to sepsis.>> improved to normal    Chronic diastolic CHF (congestive heart failure) ([place])    10/ 2013 in setting of septic shock   Complication of anesthesia    use a little anesthesia , per patient MD states she quit breathing (2016); hard to wake up   ESRD (end stage renal disease) ([place])    dialysis Tues, Thurs, Sat [address], sees dr deterding    GERD (gastroesophageal reflux disease)    Glaucoma    both eyes   H/O hiatal hernia    a. CT 2017: large gastric hiatal hernia.   History of blood transfusion 04/13/2015   History of kidney stones    10/18/2019: per patient "has a couple currently one in each kidney"    History of nephrostomy 04/11/2015   currently inplace 04/28/2015  removed now   Hyperlipidemia    Hypertension    medication removed from regimen due to low blood pressure    Iron deficiency    hx   Nephrolithiasis 2002, 2006   bilateral   PAF (paroxysmal atrial fibrillation) ([place])    a. 10/ 2013  in setting of Septic Shock //  b. recurrent during admit for pneumonia, L effusion >> placed on Amiodarone // Coumadin for anticoagulation   Primary localized osteoarthritis of right hip 10/22/2019   S/P hemodialysis catheter insertion ([place]) 04/11/2015   right anterior chest , only used once    Sigmoid diverticulosis     Medications:  Scheduled:   amiodarone  200 mg Oral Daily   feeding supplement  1 Container Oral TID BM   fidaxomicin  200 mg Oral BID   gabapentin  300 mg Oral QHS   multivitamin  1 tablet Oral QHS    Assessment: 75yoF on Eliquis 5mg  BID for Afib PTA. Hx of  paroxysmal atrial fibrillation, [place] Vas 2 Score of 3. Initially presented to [facility] due to progressively worsening diarrhea and weakness. Hx of GIB. EGD postponed due to C.diff infection.  Switching from heparin to Eliquis. Was on Eliquis PTA  Goal of Therapy:   Monitor platelets by anticoagulation protocol: Yes   Plan:  Age 75, Wt: 65kg, Scr 3.13 Stop heparin drip Start Eliquis 5 mg PO BID  Lestine Box, PharmD PGY2 Infectious Diseases Pharmacy Resident   Please check [email or web address] for unit-specific pharmacy phone numbers

## 2021-02-28 NOTE — Progress Notes (Addendum)
Subjective:  Patient continues to feel better better only 2 loose bowel movements in the last 24 hours she says  Antibiotics:  Anti-infectives (From admission, onward)    Start     Dose/Rate Route Frequency Ordered Stop   04/24/21 1000  vancomycin (VANCOCIN) capsule 125 mg  Status:  Discontinued       See Hyperspace for full Linked Orders Report.   125 mg Oral Every 3 DAYS 02/26/21 1159 02/26/21 1405   03/27/21 1000  vancomycin (VANCOCIN) capsule 125 mg  Status:  Discontinued       See Hyperspace for full Linked Orders Report.   125 mg Oral Every other day 02/26/21 1159 02/26/21 1405   03/20/21 1000  vancomycin (VANCOCIN) capsule 125 mg  Status:  Discontinued       See Hyperspace for full Linked Orders Report.   125 mg Oral Daily 02/26/21 1159 02/26/21 1405   03/12/21 2200  vancomycin (VANCOCIN) capsule 125 mg  Status:  Discontinued       See Hyperspace for full Linked Orders Report.   125 mg Oral 2 times daily 02/26/21 1159 02/26/21 1405   02/26/21 1415  fidaxomicin (DIFICID) tablet 200 mg        200 mg Oral 2 times daily 02/26/21 1405 03/08/21 0959   02/26/21 1400  vancomycin (VANCOCIN) capsule 125 mg  Status:  Discontinued       See Hyperspace for full Linked Orders Report.   125 mg Oral 4 times daily 02/26/21 1159 02/26/21 1405   02/26/21 0000  fidaxomicin (DIFICID) 200 MG TABS tablet       Note to Pharmacy: This is for a patient at [facility] and copay assistance already loaded in [place]   200 mg Oral 2 times daily 02/26/21 1408         Medications: Scheduled Meds:  amiodarone  200 mg Oral Daily   apixaban  5 mg Oral BID   feeding supplement  1 Container Oral TID BM   fidaxomicin  200 mg Oral BID   gabapentin  300 mg Oral QHS   multivitamin  1 tablet Oral QHS   Continuous Infusions:  sodium chloride Stopped (02/26/21 2332)   ferric gluconate (FERRLECIT) IVPB 62.5 mg (02/27/21 1643)   PRN Meds:.acetaminophen **OR** acetaminophen,  ondansetron (ZOFRAN) IV    Objective: Weight change: -0.1 kg  Intake/Output Summary (Last 24 hours) at 02/28/2021 1411 Last data filed at 02/28/2021 0800 Gross per 24 hour  Intake 297.61 ml  Output 2500 ml  Net -2202.39 ml    Blood pressure (!) 159/43, pulse 70, temperature 97.7 F (36.5 C), temperature source Oral, resp. rate 17, height 5\' 3"  (1.6 m), weight 65 kg, last menstrual period 01/19/1992, SpO2 94 %. Temp:  [97.1 F (36.2 C)-98.1 F (36.7 C)] 97.7 F (36.5 C) (09/11 0833) Pulse Rate:  [67-75] 70 (09/11 0833) Resp:  [17-18] 17 (09/11 0833) BP: (128-169)/(43-70) 159/43 (09/11 0833) SpO2:  [94 %-97 %] 94 % (09/11 0833) Weight:  [65 kg] 65 kg (09/10 1717)  Physical Exam: Physical Exam Constitutional:      General: She is not in acute distress.    Appearance: She is well-developed. She is not diaphoretic.  HENT:     Head: Normocephalic and atraumatic.     Right Ear: External ear normal.     Left Ear: External ear normal.     Mouth/Throat:     Pharynx: No oropharyngeal exudate.  Eyes:     General: No scleral icterus.    Extraocular Movements: Extraocular movements intact.     Conjunctiva/sclera: Conjunctivae normal.  Cardiovascular:     Rate and Rhythm: Normal rate and regular rhythm.  Pulmonary:     Effort: Pulmonary effort is normal. No respiratory distress.     Breath sounds: No wheezing or rales.  Abdominal:     General: There is no distension.     Palpations: Abdomen is soft.  Musculoskeletal:        General: Normal range of motion.  Lymphadenopathy:     Cervical: No cervical adenopathy.  Skin:    General: Skin is warm and dry.     Coloration: Skin is not pale.     Findings: Bruising present. No erythema or rash.  Neurological:     General: No focal deficit present.     Mental Status: She is alert and oriented to person, place, and time.     Motor: No abnormal muscle tone.     Coordination: Coordination normal.  Psychiatric:        Mood and  Affect: Mood normal.        Behavior: Behavior normal.        Thought Content: Thought content normal.        Judgment: Judgment normal.     CBC:    BMET Recent Labs    02/27/21 1431 02/28/21 0958  NA 135 136  K 4.3 4.2  CL 101 101  CO2 29 29  GLUCOSE 122* 112*  BUN 13 12  CREATININE 3.51* 3.13*  CALCIUM 7.6* 7.7*      Liver Panel  Recent Labs    02/26/21 0509 02/27/21 1431 02/28/21 0958  PROT 4.1*  --  4.1*  ALBUMIN 1.7* 1.5* 1.4*  AST 19  --  16  ALT 15  --  12  ALKPHOS 93  --  80  BILITOT 1.0  --  0.9        Sedimentation Rate No results for input(s): ESRSEDRATE in the last 72 hours. C-Reactive Protein No results for input(s): CRP in the last 72 hours.  Micro Results: Recent Results (from the past 720 hour(s))  Resp Panel by RT-PCR (Flu A&B, Covid) Nasopharyngeal Swab     Status: None   Collection Time: 02/25/21  9:00 PM   Specimen: Nasopharyngeal Swab; Nasopharyngeal(NP) swabs in vial transport medium  Result Value Ref Range Status   SARS Coronavirus 2 by RT PCR NEGATIVE NEGATIVE Final    Comment: (NOTE) SARS-CoV-2 target nucleic acids are NOT DETECTED.  The SARS-CoV-2 RNA is generally detectable in upper respiratory specimens during the acute phase of infection. The lowest concentration of SARS-CoV-2 viral copies this assay can detect is 138 copies/mL. A negative result does not preclude SARS-Cov-2 infection and should not be used as the sole basis for treatment or other patient management decisions. A negative result may occur with  improper specimen collection/handling, submission of specimen other than nasopharyngeal swab, presence of viral mutation(s) within the areas targeted by this assay, and inadequate number of viral copies(<138 copies/mL). A negative result must be combined with clinical observations, patient history, and epidemiological information. The expected result is Negative.  Fact Sheet for Patients:   EntrepreneurPulse.com.au  Fact Sheet for Healthcare Providers:  IncredibleEmployment.be  This test is no t yet approved or cleared by the [place] FDA and  has been authorized for detection and/or diagnosis of SARS-CoV-2 by FDA under an Emergency Use Authorization (EUA). This  EUA will remain  in effect (meaning this test can be used) for the duration of the COVID-19 declaration under Section 564(b)(1) of the Act, 21 U.S.C.section 360bbb-3(b)(1), unless the authorization is terminated  or revoked sooner.       Influenza A by PCR NEGATIVE NEGATIVE Final   Influenza B by PCR NEGATIVE NEGATIVE Final    Comment: (NOTE) The Xpert Xpress SARS-CoV-2/FLU/RSV plus assay is intended as an aid in the diagnosis of influenza from Nasopharyngeal swab specimens and should not be used as a sole basis for treatment. Nasal washings and aspirates are unacceptable for Xpert Xpress SARS-CoV-2/FLU/RSV testing.  Fact Sheet for Patients: EntrepreneurPulse.com.au  Fact Sheet for Healthcare Providers: IncredibleEmployment.be  This test is not yet approved or cleared by the [place] FDA and has been authorized for detection and/or diagnosis of SARS-CoV-2 by FDA under an Emergency Use Authorization (EUA). This EUA will remain in effect (meaning this test can be used) for the duration of the COVID-19 declaration under Section 564(b)(1) of the Act, 21 U.S.C. section 360bbb-3(b)(1), unless the authorization is terminated or revoked.  Performed at [facility], [place] [address], [place], [place] [address]   C Difficile Quick Screen w PCR reflex     Status: Abnormal   Collection Time: 02/26/21  4:25 AM   Specimen: STOOL  Result Value Ref Range Status   C Diff antigen POSITIVE (A) NEGATIVE Final   C Diff toxin POSITIVE (A) NEGATIVE Final   C Diff interpretation Toxin producing C. difficile detected.  Final     Comment: CRITICAL RESULT CALLED TO, READ BACK BY AND VERIFIED WITH: Sharrell Ku RN Performed at [facility] [address], [place], [place] [address]   Stool culture (children & immunocomp patients)     Status: None (Preliminary result)   Collection Time: 02/26/21  4:25 AM   Specimen: STOOL  Result Value Ref Range Status   Salmonella/Shigella Screen Final report  Final   Campylobacter Culture PENDING  Incomplete   E coli, Shiga toxin Assay Negative Negative Final    Comment: (NOTE) Performed At: [facility] [place], [place] [id number] Rush Farmer MD [id number]   STOOL CULTURE REFLEX - RSASHR     Status: None   Collection Time: 02/26/21  4:25 AM  Result Value Ref Range Status   Stool Culture result 1 (RSASHR) Comment  Final    Comment: (NOTE) No Salmonella or Shigella recovered. Performed At: [facility] [place], [place] [id number] Rush Farmer MD AG:[id number]     Studies/Results: No results found.    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 female with  ESRD on HD with recurrent Clostridium difficile colitis also with blood per rectum  #1 C. difficile colitis recurrent:  Continue Dificid as inpatient  NOTE Heide Guile has procured 10 day supply that has been filled and will cost her 0 copay. It is here at [facility] for when she is ready to DC to home so she can take an additional 10 days of dificid at DC  I have reviewed her CBC with diff with rising WBC but I am  reassured by her clinical response to therapy  Would continue to monitor  Avoid PPI and other unnecessary abx  #2 Blood per rectum: prep for colonoscopy is certainly  going to worsen her diarrhea  #3 PAF: on heparin at present  #4 ESRD on HD  I will sign off for now  Please call with further questions.   LOS: 2 days   Alcide Evener 02/28/2021, 2:11 PM

## 2021-02-28 NOTE — Progress Notes (Signed)
PROGRESS NOTE                                                                                                                                                                                                             Patient Demographics:    [name], is a 75 y.o. female, DOB - Jul 13, 1945, ITG:[id number]  Outpatient Primary MD for the patient is Lajean Manes, MD    LOS - 2  Admit date - 02/25/2021    CC - weakness     Brief Narrative (HPI from H&P)  75 year old female with past medical history of end-stage renal disease (HD tues, thurs, sat), severe aortic stenosis, chronic diastolic congestive heart failure (Echo 09/2020 EF 55-60% with G2DD), C. difficile colonization, paroxysmal atrial fibrillation (on eliquis), frequent urinary tract infections on suppression therapy with Keflex with recent ESBL Proteus UTI  (12/2020), anemia of chronic disease, gastroesophageal reflux disease who presents to [facility] emergency department due to progressively worsening diarrhea and weakness, in the ER work-up suggestive of recurrent C. Difficile infection, acute on chronic anemia.  GI and ID were consulted and she was admitted to the hospital.   Subjective:   Patient in bed, appears comfortable, denies any headache, no fever, no chest pain or pressure, no shortness of breath , no abdominal pain. No new focal weakness, diarrhea has improved ++.   Assessment  & Plan :   Acute on Chr. C Diff infection - she has had multiple previous episodes of C. difficile infection, GI and ID on board, currently she is on Dificid, will monitor.  Overall diarrhea has improved.  2.  Acute on chronic anemia.  Some question of rectal bleeding.  Likely due to excessive wiping from diarrhea, H&H currently appears stable, will try to monitor over heparin drip >> Eliquis if H&H stable.  Iron low but ++ Ferritin on An.Panel, PO iron while here, continue  monitor H&H.  3.  History of paroxysmal atrial fibrillation [place] vas 2 score of greater than 3.  Continue combination of amiodarone, heparin instead of Eliquis for now.  4.  ESRD. On TTS schedule.  Nephrology on board.  5.  History of ESBL UTI in the past.  Monitor.  6.  History of severe aortic stenosis.  No acute issues will monitor will avoid large drops in preload or hypotension.  7.  Chronic diastolic CHF.  EF 55% on echocardiogram in April 2020.  Currently compensated.  8.  Severe PCM.  Nutritionist following supplements as needed.  9.  Generalized weakness.  Due to ongoing C. difficile infection and diarrhea.  PT OT and supportive care.       Condition - Extremely Guarded  Family Communication  : daughter [name] [phone] - 02/28/21  Code Status : Full code  Consults  : GI, nephrology, ID  PUD Prophylaxis : None   Procedures  :           Disposition Plan  :    Status is: Inpatient  Remains inpatient appropriate because:IV treatments appropriate due to intensity of illness or inability to take PO  Dispo: The patient is from: Home              Anticipated d/c is to: Home              Patient currently is not medically stable to d/c.   Difficult to place patient No  DVT Prophylaxis  :    SCDs Start: 02/26/21 0421    Lab Results  Component Value Date   PLT 255 02/27/2021    Diet :  Diet Order             DIET SOFT Room service appropriate? Yes; Fluid consistency: Thin  Diet effective now                    Inpatient Medications  Scheduled Meds:  amiodarone  200 mg Oral Daily   feeding supplement  1 Container Oral TID BM   fidaxomicin  200 mg Oral BID   gabapentin  300 mg Oral QHS   multivitamin  1 tablet Oral QHS   Continuous Infusions:  sodium chloride Stopped (02/26/21 2332)   ferric gluconate (FERRLECIT) IVPB 62.5 mg (02/27/21 1643)   heparin 650 Units/hr (02/28/21 0239)   PRN Meds:.acetaminophen **OR** acetaminophen,  ondansetron (ZOFRAN) IV  Antibiotics  :    Anti-infectives (From admission, onward)    Start     Dose/Rate Route Frequency Ordered Stop   04/24/21 1000  vancomycin (VANCOCIN) capsule 125 mg  Status:  Discontinued       See Hyperspace for full Linked Orders Report.   125 mg Oral Every 3 DAYS 02/26/21 1159 02/26/21 1405   03/27/21 1000  vancomycin (VANCOCIN) capsule 125 mg  Status:  Discontinued       See Hyperspace for full Linked Orders Report.   125 mg Oral Every other day 02/26/21 1159 02/26/21 1405   03/20/21 1000  vancomycin (VANCOCIN) capsule 125 mg  Status:  Discontinued       See Hyperspace for full Linked Orders Report.   125 mg Oral Daily 02/26/21 1159 02/26/21 1405   03/12/21 2200  vancomycin (VANCOCIN) capsule 125 mg  Status:  Discontinued       See Hyperspace for full Linked Orders Report.   125 mg Oral 2 times daily 02/26/21 1159 02/26/21 1405   02/26/21 1415  fidaxomicin (DIFICID) tablet 200 mg        200 mg Oral 2 times daily 02/26/21 1405 03/08/21 0959   02/26/21 1400  vancomycin (VANCOCIN) capsule 125 mg  Status:  Discontinued       See Hyperspace for full Linked Orders Report.   125 mg Oral 4 times daily 02/26/21 1159 02/26/21 1405   02/26/21 0000  fidaxomicin (DIFICID) 200 MG TABS tablet  Note to Pharmacy: This is for a patient at [facility] and copay assistance already loaded in [facility]   200 mg Oral 2 times daily 02/26/21 1408          Time Spent in minutes  30   Lala Lund M.D on 02/28/2021 at 9:52 AM  To page go to [email or web address]   [facility] -  Office  [phone]    See all Orders from today for further details    Objective:   Vitals:   02/27/21 1717 02/27/21 1758 02/27/21 2144 02/28/21 0833  BP: (!) 158/62 (!) 153/52 (!) 148/50 (!) 159/43  Pulse: 72 71 69 70  Resp:  17 18 17   Temp: (!) 97.2 F (36.2 C) 97.8 F (36.6 C) 98.1 F (36.7 C) 97.7 F (36.5 C)  TempSrc: Oral Oral Oral Oral  SpO2: 94% 97%  95% 94%  Weight: 65 kg     Height:        Wt Readings from Last 3 Encounters:  02/27/21 65 kg  01/09/21 66.5 kg  12/02/20 73.1 kg     Intake/Output Summary (Last 24 hours) at 02/28/2021 0952 Last data filed at 02/27/2021 1754 Gross per 24 hour  Intake 57.61 ml  Output 2500 ml  Net -2442.39 ml     Physical Exam  Awake Alert, No new F.N deficits, Normal affect [place].AT,PERRAL Supple Neck,No JVD, No cervical lymphadenopathy appriciated.  Symmetrical Chest wall movement, Good air movement bilaterally, CTAB RRR,No Gallops, chronic systolic aortic murmur +ve B.Sounds, Abd Soft, No tenderness, No organomegaly appriciated, No rebound - guarding or rigidity. No Cyanosis, Clubbing or edema, No new Rash or bruise    Data Review:    CBC Recent Labs  Lab 02/25/21 2253 02/26/21 0509 02/27/21 0121  WBC 11.8* 13.4* 13.2*  HGB 8.8* 8.3* 8.1*  HCT 28.7* 27.1* 25.9*  PLT 291 278 255  MCV 97.6 97.8 96.6  MCH 29.9 30.0 30.2  MCHC 30.7 30.6 31.3  RDW 20.8* 21.2* 21.9*  LYMPHSABS 0.7  --   --   MONOABS 0.7  --   --   EOSABS 0.1  --   --   BASOSABS 0.1  --   --     Recent Labs  Lab 02/25/21 2253 02/26/21 0509 02/27/21 0121 02/27/21 1431  NA 137 136  --  135  K 3.7 3.8  --  4.3  CL 98 98  --  101  CO2 31 31  --  29  GLUCOSE 81 85  --  122*  BUN 8 9  --  13  CREATININE 2.11* 2.27*  --  3.51*  CALCIUM 7.7* 7.7*  --  7.6*  AST 21 19  --   --   ALT 16 15  --   --   ALKPHOS 97 93  --   --   BILITOT 0.9 1.0  --   --   ALBUMIN 1.8* 1.7*  --  1.5*  INR  --  3.2* 2.9*  --     ------------------------------------------------------------------------------------------------------------------ No results for input(s): CHOL, HDL, LDLCALC, TRIG, CHOLHDL, LDLDIRECT in the last 72 hours.  Lab Results  Component Value Date   HGBA1C 5.6 11/13/2014   ------------------------------------------------------------------------------------------------------------------ No results for  input(s): TSH, T4TOTAL, T3FREE, THYROIDAB in the last 72 hours.  Invalid input(s): FREET3  Cardiac Enzymes No results for input(s): CKMB, TROPONINI, MYOGLOBIN in the last 168 hours.  Invalid input(s): CK ------------------------------------------------------------------------------------------------------------------    Component Value Date/Time   BNP 2,447.3 (  H) 06/08/2020 0525    Radiology Reports DG Chest Portable 1 View  Result Date: 02/25/2021 CLINICAL DATA:  Fatigue, weakness EXAM: PORTABLE CHEST 1 VIEW COMPARISON:  12/31/2020 FINDINGS: Right dialysis catheter remains in place, unchanged. Cardiomegaly with vascular congestion. Perihilar and lower lobe airspace disease, likely edema. Suspect layering effusions. IMPRESSION: Cardiomegaly with mild CHF.  Suspect layering effusions. Electronically Signed   By: Rolm Baptise M.D.   On: 02/25/2021 19:34

## 2021-02-28 NOTE — Plan of Care (Signed)

## 2021-02-28 NOTE — Progress Notes (Signed)
Subjective: Patient's last bowel movement was yesterday evening, as per documentation it was red and mushy. Patient denies abdominal pain. States she ate bagel and lots of cream cheese for breakfast. Has not had a bowel movement overnight or today morning. Complains of still feeling weak with sore extremities.  Objective: Vital signs in last 24 hours: Temp:  [97.1 F (36.2 C)-98.1 F (36.7 C)] 97.7 F (36.5 C) (09/11 0833) Pulse Rate:  [67-75] 70 (09/11 0833) Resp:  [17-18] 17 (09/11 0833) BP: (128-169)/(43-70) 159/43 (09/11 0833) SpO2:  [94 %-97 %] 94 % (09/11 0833) Weight:  [65 kg-67.7 kg] 65 kg (09/10 1717) Weight change: -0.1 kg Last BM Date: 02/27/21  PE: Elderly, chronically ill GENERAL: Not in distress, able to speak in full sentences, mild pallor  ABDOMEN: Soft, nondistended, nontender EXTREMITIES: No deformity  Lab Results: Results for orders placed or performed during the hospital encounter of 02/25/21 (from the past 48 hour(s))  Protime-INR     Status: Abnormal   Collection Time: 02/27/21  1:21 AM  Result Value Ref Range   Prothrombin Time 30.3 (H) 11.4 - 15.2 seconds   INR 2.9 (H) 0.8 - 1.2    Comment: (NOTE) INR goal varies based on device and disease states. Performed at [facility], [place] [address], [place], [place] [address]   CBC     Status: Abnormal   Collection Time: 02/27/21  1:21 AM  Result Value Ref Range   WBC 13.2 (H) 4.0 - 10.5 K/uL   RBC 2.68 (L) 3.87 - 5.11 MIL/uL   Hemoglobin 8.1 (L) 12.0 - 15.0 g/dL   HCT 25.9 (L) 36.0 - 46.0 %   MCV 96.6 80.0 - 100.0 fL   MCH 30.2 26.0 - 34.0 pg   MCHC 31.3 30.0 - 36.0 g/dL   RDW 21.9 (H) 11.5 - 15.5 %   Platelets 255 150 - 400 K/uL   nRBC 0.0 0.0 - 0.2 %    Comment: Performed at [facility], [place] [address], [place], [place] [address]  Reticulocytes     Status: Abnormal   Collection Time: 02/27/21  1:21 AM  Result Value Ref Range   Retic Ct Pct 3.5 (H) 0.4 - 3.1 %   RBC. 2.75 (L) 3.87  - 5.11 MIL/uL   Retic Count, Absolute 96.8 19.0 - 186.0 K/uL   Immature Retic Fract 23.8 (H) 2.3 - 15.9 %    Comment: Performed at [facility] [address], [place], [place] [address]  Type and screen [place]     Status: None   Collection Time: 02/27/21 10:25 AM  Result Value Ref Range   ABO/RH(D) O POS    Antibody Screen NEG    Sample Expiration      03/02/2021,2359 Performed at [facility], [place] [address] [place], [place] [address]   Vitamin B12     Status: Abnormal   Collection Time: 02/27/21 11:22 AM  Result Value Ref Range   Vitamin B-12 1,093 (H) 180 - 914 pg/mL    Comment: (NOTE) This assay is not validated for testing neonatal or myeloproliferative syndrome specimens for Vitamin B12 levels. Performed at [facility], [place] [address], [place], [place] [address]   Iron and TIBC     Status: Abnormal   Collection Time: 02/27/21 11:22 AM  Result Value Ref Range   Iron 26 (L) 28 - 170 ug/dL   TIBC QUANTITY NOT SUFFICIENT, UNABLE TO PERFORM TEST  250 - 450 ug/dL   Saturation Ratios QUANTITY NOT SUFFICIENT, UNABLE TO PERFORM TEST 10.4 - 31.8 %   UIBC QUANTITY NOT SUFFICIENT, UNABLE TO PERFORM TEST ug/dL    Comment: Performed at [facility], [place] [address], [place], [place] [address]  Ferritin     Status: Abnormal   Collection Time: 02/27/21 11:22 AM  Result Value Ref Range   Ferritin 1,081 (H) 11 - 307 ng/mL    Comment: Performed at [facility], [place] [address], [place], [place] [address]  Renal function panel     Status: Abnormal   Collection Time: 02/27/21  2:31 PM  Result Value Ref Range   Sodium 135 135 - 145 mmol/L   Potassium 4.3 3.5 - 5.1 mmol/L   Chloride 101 98 - 111 mmol/L   CO2 29 22 - 32 mmol/L   Glucose, Bld 122 (H) 70 - 99 mg/dL    Comment: Glucose reference range applies only to samples taken after fasting for at least 8 hours.   BUN 13 8 - 23 mg/dL   Creatinine, Ser 3.51 (H) 0.44 - 1.00 mg/dL    Calcium 7.6 (L) 8.9 - 10.3 mg/dL   Phosphorus 2.7 2.5 - 4.6 mg/dL   Albumin 1.5 (L) 3.5 - 5.0 g/dL   GFR, Estimated 13 (L) >60 mL/min    Comment: (NOTE) Calculated using the CKD-EPI Creatinine Equation (2021)    Anion gap 5 5 - 15    Comment: Performed at [facility] [address], [place], [place] [address]  Heparin level (unfractionated)     Status: Abnormal   Collection Time: 02/27/21  8:19 PM  Result Value Ref Range   Heparin Unfractionated >1.10 (H) 0.30 - 0.70 IU/mL    Comment: (NOTE) The clinical reportable range upper limit is being lowered to >1.10 to align with the FDA approved guidance for the current laboratory assay.  If heparin results are below expected values, and patient dosage has  been confirmed, suggest follow up testing of antithrombin III levels. Performed at [facility], [place] [address], [place], [place] [address]   APTT     Status: Abnormal   Collection Time: 02/27/21  8:19 PM  Result Value Ref Range   aPTT >200 (HH) 24 - 36 seconds    Comment:        IF BASELINE aPTT IS ELEVATED, SUGGEST PATIENT RISK ASSESSMENT BE USED TO DETERMINE APPROPRIATE ANTICOAGULANT THERAPY. REPEATED TO VERIFY CRITICAL RESULT CALLED TO, READ BACK BY AND VERIFIED WITH: NOTIFIED Paula Libra RN @2206  02/27/21 A JOHNSON  Performed at [facility], [place] [address] [place], [place] [address]   APTT     Status: Abnormal   Collection Time: 02/27/21 10:45 PM  Result Value Ref Range   aPTT >200 (HH) 24 - 36 seconds    Comment:        IF BASELINE aPTT IS ELEVATED, SUGGEST PATIENT RISK ASSESSMENT BE USED TO DETERMINE APPROPRIATE ANTICOAGULANT THERAPY. REPEATED TO VERIFY CRITICAL RESULT CALLED TO, READ BACK BY AND VERIFIED WITH: Hulan Fray RN @ 1443 02/27/21 A JOHNSON  SPECIMEN CHECK OK 02/27/21  Performed at [facility], [place] [address], [place], [place] [address]   Protime-INR     Status: Abnormal   Collection Time: 02/28/21  9:58 AM   Result Value Ref Range   Prothrombin Time 28.3 (H) 11.4 - 15.2 seconds   INR 2.7 (H) 0.8 - 1.2  Comment: (NOTE) INR goal varies based on device and disease states. Performed at [facility], [place] [address], [place], [place] [address]   Magnesium     Status: Abnormal   Collection Time: 02/28/21  9:58 AM  Result Value Ref Range   Magnesium 1.6 (L) 1.7 - 2.4 mg/dL    Comment: Performed at [facility] [address], [place], [place] [address]  Comprehensive metabolic panel     Status: Abnormal   Collection Time: 02/28/21  9:58 AM  Result Value Ref Range   Sodium 136 135 - 145 mmol/L   Potassium 4.2 3.5 - 5.1 mmol/L   Chloride 101 98 - 111 mmol/L   CO2 29 22 - 32 mmol/L   Glucose, Bld 112 (H) 70 - 99 mg/dL    Comment: Glucose reference range applies only to samples taken after fasting for at least 8 hours.   BUN 12 8 - 23 mg/dL   Creatinine, Ser 3.13 (H) 0.44 - 1.00 mg/dL   Calcium 7.7 (L) 8.9 - 10.3 mg/dL   Total Protein 4.1 (L) 6.5 - 8.1 g/dL   Albumin 1.4 (L) 3.5 - 5.0 g/dL   AST 16 15 - 41 U/L   ALT 12 0 - 44 U/L   Alkaline Phosphatase 80 38 - 126 U/L   Total Bilirubin 0.9 0.3 - 1.2 mg/dL   GFR, Estimated 15 (L) >60 mL/min    Comment: (NOTE) Calculated using the CKD-EPI Creatinine Equation (2021)    Anion gap 6 5 - 15    Comment: Performed at [facility], [place] [address] [place], [place] [address]  CBC with Differential/Platelet     Status: Abnormal   Collection Time: 02/28/21  9:58 AM  Result Value Ref Range   WBC 16.7 (H) 4.0 - 10.5 K/uL   RBC 2.99 (L) 3.87 - 5.11 MIL/uL   Hemoglobin 8.9 (L) 12.0 - 15.0 g/dL   HCT 28.8 (L) 36.0 - 46.0 %   MCV 96.3 80.0 - 100.0 fL   MCH 29.8 26.0 - 34.0 pg   MCHC 30.9 30.0 - 36.0 g/dL   RDW 22.1 (H) 11.5 - 15.5 %   Platelets 224 150 - 400 K/uL   nRBC 0.0 0.0 - 0.2 %   Neutrophils Relative % 88 %   Neutro Abs 14.7 (H) 1.7 - 7.7 K/uL   Lymphocytes Relative 4 %   Lymphs Abs 0.7 0.7 - 4.0 K/uL   Monocytes  Relative 6 %   Monocytes Absolute 1.0 0.1 - 1.0 K/uL   Eosinophils Relative 1 %   Eosinophils Absolute 0.2 0.0 - 0.5 K/uL   Basophils Relative 0 %   Basophils Absolute 0.1 0.0 - 0.1 K/uL   Immature Granulocytes 1 %   Abs Immature Granulocytes 0.08 (H) 0.00 - 0.07 K/uL    Comment: Performed at [facility] [address], [place], [place] [address]  Brain natriuretic peptide     Status: Abnormal   Collection Time: 02/28/21  9:58 AM  Result Value Ref Range   B Natriuretic Peptide 1,688.3 (H) 0.0 - 100.0 pg/mL    Comment: Performed at [facility] [address], [place], [place] [address]  Heparin level (unfractionated)     Status: Abnormal   Collection Time: 02/28/21  9:58 AM  Result Value Ref Range   Heparin Unfractionated >1.10 (H) 0.30 - 0.70 IU/mL    Comment: (NOTE) The clinical reportable range upper limit is being lowered  to >1.10 to align with the FDA approved guidance for the current laboratory assay.  If heparin results are below expected values, and patient dosage has  been confirmed, suggest follow up testing of antithrombin III levels. Performed at [facility], [place] [address], [place], [place] [address]   APTT     Status: Abnormal   Collection Time: 02/28/21  9:58 AM  Result Value Ref Range   aPTT 75 (H) 24 - 36 seconds    Comment:        IF BASELINE aPTT IS ELEVATED, SUGGEST PATIENT RISK ASSESSMENT BE USED TO DETERMINE APPROPRIATE ANTICOAGULANT THERAPY. Performed at [facility], [place] [address], [place], [place] [address]     Studies/Results: No results found.  Medications: I have reviewed the patient's current medications.  Assessment: Recurrent C. difficile currently treated with Dificid with marked improvement in diarrhea Rectal bleeding could be related to hemorrhoids or C. difficile colitis Hemoglobin stable from 8.1-8.9 today  Persistent leukocytosis, WBC 16.7 today Normocytic anemia End-stage renal disease on  hemodialysis 3 times a week  Resumed on Eliquis, PT 28.3, INR 2.7 today  Plan: Continue Dificid for total of 10 days  No current plans on endoscopy or colonoscopy for anemia, Eliquis has been resumed.  Tolerating soft diet. Plan is to avoid antibiotics, avoid PPI, avoid Imodium or probiotics for now.   Ronnette Juniper, MD 02/28/2021, 1:34 PM

## 2021-03-01 ENCOUNTER — Other Ambulatory Visit (HOSPITAL_COMMUNITY): Payer: Self-pay

## 2021-03-01 LAB — CBC WITH DIFFERENTIAL/PLATELET
Abs Immature Granulocytes: 0.07 10*3/uL (ref 0.00–0.07)
Basophils Absolute: 0.1 10*3/uL (ref 0.0–0.1)
Basophils Relative: 1 %
Eosinophils Absolute: 0.1 10*3/uL (ref 0.0–0.5)
Eosinophils Relative: 1 %
HCT: 26.6 % — ABNORMAL LOW (ref 36.0–46.0)
Hemoglobin: 8.1 g/dL — ABNORMAL LOW (ref 12.0–15.0)
Immature Granulocytes: 1 %
Lymphocytes Relative: 6 %
Lymphs Abs: 0.8 10*3/uL (ref 0.7–4.0)
MCH: 30.7 pg (ref 26.0–34.0)
MCHC: 30.5 g/dL (ref 30.0–36.0)
MCV: 100.8 fL — ABNORMAL HIGH (ref 80.0–100.0)
Monocytes Absolute: 0.8 10*3/uL (ref 0.1–1.0)
Monocytes Relative: 7 %
Neutro Abs: 11.1 10*3/uL — ABNORMAL HIGH (ref 1.7–7.7)
Neutrophils Relative %: 84 %
Platelets: 210 10*3/uL (ref 150–400)
RBC: 2.64 MIL/uL — ABNORMAL LOW (ref 3.87–5.11)
RDW: 22.1 % — ABNORMAL HIGH (ref 11.5–15.5)
Smear Review: NORMAL
WBC: 13 10*3/uL — ABNORMAL HIGH (ref 4.0–10.5)
nRBC: 0.2 % (ref 0.0–0.2)

## 2021-03-01 LAB — COMPREHENSIVE METABOLIC PANEL
ALT: 10 U/L (ref 0–44)
AST: 16 U/L (ref 15–41)
Albumin: 1.2 g/dL — ABNORMAL LOW (ref 3.5–5.0)
Alkaline Phosphatase: 70 U/L (ref 38–126)
Anion gap: 9 (ref 5–15)
BUN: 17 mg/dL (ref 8–23)
CO2: 25 mmol/L (ref 22–32)
Calcium: 7.4 mg/dL — ABNORMAL LOW (ref 8.9–10.3)
Chloride: 103 mmol/L (ref 98–111)
Creatinine, Ser: 3.74 mg/dL — ABNORMAL HIGH (ref 0.44–1.00)
GFR, Estimated: 12 mL/min — ABNORMAL LOW (ref >60)
Glucose, Bld: 89 mg/dL (ref 70–99)
Potassium: 4.6 mmol/L (ref 3.5–5.1)
Sodium: 137 mmol/L (ref 135–145)
Total Bilirubin: 0.8 mg/dL (ref 0.3–1.2)
Total Protein: 3.3 g/dL — ABNORMAL LOW (ref 6.5–8.1)

## 2021-03-01 LAB — BRAIN NATRIURETIC PEPTIDE: B Natriuretic Peptide: 10.6 pg/mL (ref 0.0–100.0)

## 2021-03-01 LAB — MAGNESIUM: Magnesium: 2.1 mg/dL (ref 1.7–2.4)

## 2021-03-01 LAB — APTT: aPTT: 49 seconds — ABNORMAL HIGH (ref 24–36)

## 2021-03-01 MED ORDER — SODIUM CHLORIDE 0.9 % IV SOLN: 100.0000 mL | INTRAVENOUS | Status: DC | PRN

## 2021-03-01 MED ORDER — HEPARIN SODIUM (PORCINE) 1000 UNIT/ML DIALYSIS
1000.0000 [IU] | INTRAMUSCULAR | Status: DC | PRN
  Administered 2021-03-02: 3200 [IU] via INTRAVENOUS_CENTRAL
  Filled 2021-03-01: qty 1

## 2021-03-01 MED ORDER — ALTEPLASE 2 MG IJ SOLR: 2.0000 mg | Freq: Once | INTRAMUSCULAR | Status: DC | PRN

## 2021-03-01 MED ORDER — LIDOCAINE-PRILOCAINE 2.5-2.5 % EX CREA: 1.0000 "application " | TOPICAL_CREAM | CUTANEOUS | Status: DC | PRN

## 2021-03-01 MED ORDER — LIDOCAINE HCL (PF) 1 % IJ SOLN: 5.0000 mL | INTRAMUSCULAR | Status: DC | PRN

## 2021-03-01 MED ORDER — PENTAFLUOROPROP-TETRAFLUOROETH EX AERO: 1.0000 "application " | INHALATION_SPRAY | CUTANEOUS | Status: DC | PRN

## 2021-03-01 NOTE — Progress Notes (Signed)
Subjective: Patient believes she only had 1 bowel movement yesterday. Spoke with the patient's nurse, last documented bowel movement was on 02/27/2021. She has been able to tolerate regular diet. Complains of mild self-limited nausea but denies abdominal pain.  Objective: Vital signs in last 24 hours: Temp:  [97.7 F (36.5 C)-99.3 F (37.4 C)] 99.1 F (37.3 C) (09/11 2100) Pulse Rate:  [70-74] 70 (09/11 2100) Resp:  [17-18] 18 (09/11 2100) BP: (146-159)/(43-60) 154/48 (09/11 2100) SpO2:  [92 %-94 %] 92 % (09/11 2100) Weight change:  Last BM Date: 02/27/21  PE: Ill-appearing, mild pallor, lying on bed, partially sitting up GENERAL: Able to speak in few words, not in distress  ABDOMEN: Nondistended, nontender, normoactive bowel sounds EXTREMITIES: No deformity  Lab Results: Results for orders placed or performed during the hospital encounter of 02/25/21 (from the past 48 hour(s))  Type and screen [place]     Status: None   Collection Time: 02/27/21 10:25 AM  Result Value Ref Range   ABO/RH(D) O POS    Antibody Screen NEG    Sample Expiration      03/02/2021,2359 Performed at [facility], [place] [address], [place], [place] [address]   Vitamin B12     Status: Abnormal   Collection Time: 02/27/21 11:22 AM  Result Value Ref Range   Vitamin B-12 1,093 (H) 180 - 914 pg/mL    Comment: (NOTE) This assay is not validated for testing neonatal or myeloproliferative syndrome specimens for Vitamin B12 levels. Performed at [facility], [place] [address], [place], [place] [address]   Iron and TIBC     Status: Abnormal   Collection Time: 02/27/21 11:22 AM  Result Value Ref Range   Iron 26 (L) 28 - 170 ug/dL   TIBC QUANTITY NOT SUFFICIENT, UNABLE TO PERFORM TEST 250 - 450 ug/dL   Saturation Ratios QUANTITY NOT SUFFICIENT, UNABLE TO PERFORM TEST 10.4 - 31.8 %   UIBC QUANTITY NOT SUFFICIENT, UNABLE TO PERFORM TEST ug/dL    Comment: Performed at [facility] [address], [place], [place] [address]  Ferritin     Status: Abnormal   Collection Time: 02/27/21 11:22 AM  Result Value Ref Range   Ferritin 1,081 (H) 11 - 307 ng/mL    Comment: Performed at [facility], [place] [address] [place], [place] [address]  Renal function panel     Status: Abnormal   Collection Time: 02/27/21  2:31 PM  Result Value Ref Range   Sodium 135 135 - 145 mmol/L   Potassium 4.3 3.5 - 5.1 mmol/L   Chloride 101 98 - 111 mmol/L   CO2 29 22 - 32 mmol/L   Glucose, Bld 122 (H) 70 - 99 mg/dL    Comment: Glucose reference range applies only to samples taken after fasting for at least 8 hours.   BUN 13 8 - 23 mg/dL   Creatinine, Ser 3.51 (H) 0.44 - 1.00 mg/dL   Calcium 7.6 (L) 8.9 - 10.3 mg/dL   Phosphorus 2.7 2.5 - 4.6 mg/dL   Albumin 1.5 (L) 3.5 - 5.0 g/dL   GFR, Estimated 13 (L) >60 mL/min    Comment: (NOTE) Calculated using the CKD-EPI Creatinine Equation (2021)    Anion gap 5 5 - 15    Comment: Performed at [facility] [address], [place], [place] [address]  Heparin level (unfractionated)     Status: Abnormal   Collection Time: 02/27/21  8:19 PM  Result Value Ref Range   Heparin Unfractionated >1.10 (H) 0.30 - 0.70 IU/mL    Comment: (NOTE) The clinical reportable range upper limit is being lowered to >1.10 to align with the FDA approved guidance for the current laboratory assay.  If heparin results are below expected values, and patient dosage has  been confirmed, suggest follow up testing of antithrombin III levels. Performed at [facility], [place] [address] [place], [place] [address]   APTT     Status: Abnormal   Collection Time: 02/27/21  8:19 PM  Result Value Ref Range   aPTT >200 (HH) 24 - 36 seconds    Comment:        IF BASELINE aPTT IS ELEVATED, SUGGEST PATIENT RISK ASSESSMENT BE USED TO DETERMINE APPROPRIATE ANTICOAGULANT THERAPY. REPEATED TO VERIFY CRITICAL RESULT CALLED TO, READ BACK BY AND VERIFIED  WITH: NOTIFIED Paula Libra RN @[phone]  02/27/21 A JOHNSON  Performed at [facility], [place] [address] [place], [place] [address]   APTT     Status: Abnormal   Collection Time: 02/27/21 10:45 PM  Result Value Ref Range   aPTT >200 (HH) 24 - 36 seconds    Comment:        IF BASELINE aPTT IS ELEVATED, SUGGEST PATIENT RISK ASSESSMENT BE USED TO DETERMINE APPROPRIATE ANTICOAGULANT THERAPY. REPEATED TO VERIFY CRITICAL RESULT CALLED TO, READ BACK BY AND VERIFIED WITH: Hulan Fray RN @ 1062 02/27/21 A JOHNSON  SPECIMEN CHECK OK 02/27/21  Performed at [facility], [place] [address], [place], [place] [address]   Protime-INR     Status: Abnormal   Collection Time: 02/28/21  9:58 AM  Result Value Ref Range   Prothrombin Time 28.3 (H) 11.4 - 15.2 seconds   INR 2.7 (H) 0.8 - 1.2    Comment: (NOTE) INR goal varies based on device and disease states. Performed at [facility], [place] [address], [place], [place] [address]   Magnesium     Status: Abnormal   Collection Time: 02/28/21  9:58 AM  Result Value Ref Range   Magnesium 1.6 (L) 1.7 - 2.4 mg/dL    Comment: Performed at [facility] [address], [place], [place] [address]  Comprehensive metabolic panel     Status: Abnormal   Collection Time: 02/28/21  9:58 AM  Result Value Ref Range   Sodium 136 135 - 145 mmol/L   Potassium 4.2 3.5 - 5.1 mmol/L   Chloride 101 98 - 111 mmol/L   CO2 29 22 - 32 mmol/L   Glucose, Bld 112 (H) 70 - 99 mg/dL    Comment: Glucose reference range applies only to samples taken after fasting for at least 8 hours.   BUN 12 8 - 23 mg/dL   Creatinine, Ser 3.13 (H) 0.44 - 1.00 mg/dL   Calcium 7.7 (L) 8.9 - 10.3 mg/dL   Total Protein 4.1 (L) 6.5 - 8.1 g/dL   Albumin 1.4 (L) 3.5 - 5.0 g/dL   AST 16 15 - 41 U/L   ALT 12 0 - 44 U/L   Alkaline Phosphatase 80 38 - 126 U/L   Total Bilirubin 0.9 0.3 - 1.2 mg/dL   GFR, Estimated 15 (L) >60 mL/min    Comment: (NOTE) Calculated using the  CKD-EPI Creatinine Equation (2021)    Anion gap 6 5 - 15    Comment: Performed at [facility], [place] [address] [place], [place] [address]  CBC with Differential/Platelet     Status: Abnormal   Collection Time: 02/28/21  9:58 AM  Result Value Ref Range   WBC 16.7 (H) 4.0 - 10.5 K/uL   RBC 2.99 (L) 3.87 - 5.11 MIL/uL   Hemoglobin 8.9 (L) 12.0 - 15.0 g/dL   HCT 28.8 (L) 36.0 - 46.0 %   MCV 96.3 80.0 - 100.0 fL   MCH 29.8 26.0 - 34.0 pg   MCHC 30.9 30.0 - 36.0 g/dL   RDW 22.1 (H) 11.5 - 15.5 %   Platelets 224 150 - 400 K/uL   nRBC 0.0 0.0 - 0.2 %   Neutrophils Relative % 88 %   Neutro Abs 14.7 (H) 1.7 - 7.7 K/uL   Lymphocytes Relative 4 %   Lymphs Abs 0.7 0.7 - 4.0 K/uL   Monocytes Relative 6 %   Monocytes Absolute 1.0 0.1 - 1.0 K/uL   Eosinophils Relative 1 %   Eosinophils Absolute 0.2 0.0 - 0.5 K/uL   Basophils Relative 0 %   Basophils Absolute 0.1 0.0 - 0.1 K/uL   Immature Granulocytes 1 %   Abs Immature Granulocytes 0.08 (H) 0.00 - 0.07 K/uL    Comment: Performed at [facility], [address], [place], [place] [address]  Brain natriuretic peptide     Status: Abnormal   Collection Time: 02/28/21  9:58 AM  Result Value Ref Range   B Natriuretic Peptide 1,688.3 (H) 0.0 - 100.0 pg/mL    Comment: Performed at [facility] [address] [place], [place] [address]  Heparin level (unfractionated)     Status: Abnormal   Collection Time: 02/28/21  9:58 AM  Result Value Ref Range   Heparin Unfractionated >1.10 (H) 0.30 - 0.70 IU/mL    Comment: (NOTE) The clinical reportable range upper limit is being lowered to >1.10 to align with the FDA approved guidance for the current laboratory assay.  If heparin results are below expected values, and patient dosage has  been confirmed, suggest follow up testing of antithrombin III levels. Performed at [facility], [place] [address] [place], [place] [address]   APTT     Status: Abnormal   Collection Time:  02/28/21  9:58 AM  Result Value Ref Range   aPTT 75 (H) 24 - 36 seconds    Comment:        IF BASELINE aPTT IS ELEVATED, SUGGEST PATIENT RISK ASSESSMENT BE USED TO DETERMINE APPROPRIATE ANTICOAGULANT THERAPY. Performed at [facility], [place] [address] [place], [place] [address]   Magnesium     Status: None   Collection Time: 03/01/21  1:36 AM  Result Value Ref Range   Magnesium 2.1 1.7 - 2.4 mg/dL    Comment: Performed at [facility] [address], [place], [place] [address]  Comprehensive metabolic panel     Status: Abnormal   Collection Time: 03/01/21  1:36 AM  Result Value Ref Range   Sodium 137 135 - 145 mmol/L   Potassium 4.6 3.5 - 5.1 mmol/L   Chloride 103 98 - 111 mmol/L   CO2 25 22 - 32 mmol/L   Glucose, Bld 89 70 - 99 mg/dL    Comment: Glucose reference range applies only to samples taken after fasting for at least 8 hours.   BUN 17 8 - 23 mg/dL   Creatinine, Ser 3.74 (H) 0.44 - 1.00 mg/dL   Calcium 7.4 (L) 8.9 - 10.3 mg/dL   Total Protein 3.3 (  L) 6.5 - 8.1 g/dL   Albumin 1.2 (L) 3.5 - 5.0 g/dL   AST 16 15 - 41 U/L   ALT 10 0 - 44 U/L   Alkaline Phosphatase 70 38 - 126 U/L   Total Bilirubin 0.8 0.3 - 1.2 mg/dL   GFR, Estimated 12 (L) >60 mL/min    Comment: (NOTE) Calculated using the CKD-EPI Creatinine Equation (2021)    Anion gap 9 5 - 15    Comment: Performed at [facility] [address] [place], [place] [address]  CBC with Differential/Platelet     Status: Abnormal   Collection Time: 03/01/21  1:36 AM  Result Value Ref Range   WBC 13.0 (H) 4.0 - 10.5 K/uL   RBC 2.64 (L) 3.87 - 5.11 MIL/uL   Hemoglobin 8.1 (L) 12.0 - 15.0 g/dL   HCT 26.6 (L) 36.0 - 46.0 %   MCV 100.8 (H) 80.0 - 100.0 fL   MCH 30.7 26.0 - 34.0 pg   MCHC 30.5 30.0 - 36.0 g/dL   RDW 22.1 (H) 11.5 - 15.5 %   Platelets 210 150 - 400 K/uL   nRBC 0.2 0.0 - 0.2 %   Neutrophils Relative % 84 %   Neutro Abs 11.1 (H) 1.7 - 7.7 K/uL   Lymphocytes Relative 6 %   Lymphs Abs  0.8 0.7 - 4.0 K/uL   Monocytes Relative 7 %   Monocytes Absolute 0.8 0.1 - 1.0 K/uL   Eosinophils Relative 1 %   Eosinophils Absolute 0.1 0.0 - 0.5 K/uL   Basophils Relative 1 %   Basophils Absolute 0.1 0.0 - 0.1 K/uL   WBC Morphology MORPHOLOGY UNREMARKABLE    Smear Review Normal platelet morphology    Immature Granulocytes 1 %   Abs Immature Granulocytes 0.07 0.00 - 0.07 K/uL   Target Cells PRESENT     Comment: Performed at [facility], [place] [address], [place], [place] [address]  Brain natriuretic peptide     Status: None   Collection Time: 03/01/21  1:36 AM  Result Value Ref Range   B Natriuretic Peptide 10.6 0.0 - 100.0 pg/mL    Comment: Performed at [facility] [address], [place], [place] [address]  APTT     Status: Abnormal   Collection Time: 03/01/21  1:36 AM  Result Value Ref Range   aPTT 49 (H) 24 - 36 seconds    Comment:        IF BASELINE aPTT IS ELEVATED, SUGGEST PATIENT RISK ASSESSMENT BE USED TO DETERMINE APPROPRIATE ANTICOAGULANT THERAPY. Performed at [facility], [place] [address] [place], [place] [address]     Studies/Results: No results found.  Medications: I have reviewed the patient's current medications.  Assessment: Recurrent C. difficile diarrhea, showing marked improvement with fidaxomicin Improving leukocytosis Chronic anemia, hemoglobin 8.1, MCV 100.8, Eliquis has been resumed End-stage renal disease on hemodialysis Multiple comorbidities-severe aortic stenosis, chronic diastolic congestive heart failure, paroxysmal A. fib, frequent urinary tract infections  Plan: Continue fidaxomicin, as per ID, on discharge patient is to get 10 more days of fidaxomicin(prescription available at pharmacy for outpatient therapy).  Since this is the first time that her diarrhea from C. difficile seems to be under control, we opted to not do a colonoscopy which would require a prep and would worsen her diarrhea.  Chronic anemia work-up  with EGD and colonoscopy on hold and will likely have to be considered as an outpatient.  At this point,  GI will sign off. Please recall if needed.  Patient's daughter is adamant that she does not want to take her mother to nursing home, plans to take her home upon discharge.   Ronnette Juniper, MD 03/01/2021, 8:32 AM

## 2021-03-01 NOTE — Progress Notes (Addendum)
[facility] Progress Note   Subjective:    Patient seen and examined at bedside. No complaints. Denies SOB, CP, ABD pain. Plan for HD 9/13.   Objective Vitals:   02/28/21 0833 02/28/21 1701 02/28/21 2100 03/01/21 0930  BP: (!) 159/43 (!) 146/60 (!) 154/48 (!) 142/48  Pulse: 70 74 70 68  Resp: 17 17 18 15   Temp: 97.7 F (36.5 C) 99.3 F (37.4 C) 99.1 F (37.3 C) 97.8 F (36.6 C)  TempSrc: Oral Oral Oral Oral  SpO2: 94%  92% 93%  Weight:      Height:       Physical Exam General:chronically ill appearing female in NAD Heart:RRR, +7/4 systolic murmur Lungs:CTAB, breath sounds decreased in bases, nml WOB on RA Abdomen:soft, non-tender, active bowel sounds Extremities:1+ LE edema Dialysis Access: TDC, LU AVF +b/t   Filed Weights   02/27/21 0057 02/27/21 1410 02/27/21 1717  Weight: 67.8 kg 67.7 kg 65 kg    Intake/Output Summary (Last 24 hours) at 03/01/2021 1501 Last data filed at 03/01/2021 1300 Gross per 24 hour  Intake 1320 ml  Output --  Net 1320 ml    Additional Objective Labs: Basic Metabolic Panel: Recent Labs  Lab 02/27/21 1431 02/28/21 0958 03/01/21 0136  NA 135 136 137  K 4.3 4.2 4.6  CL 101 101 103  CO2 29 29 25   GLUCOSE 122* 112* 89  BUN 13 12 17   CREATININE 3.51* 3.13* 3.74*  CALCIUM 7.6* 7.7* 7.4*  PHOS 2.7  --   --    Liver Function Tests: Recent Labs  Lab 02/26/21 0509 02/27/21 1431 02/28/21 0958 03/01/21 0136  AST 19  --  16 16  ALT 15  --  12 10  ALKPHOS 93  --  80 70  BILITOT 1.0  --  0.9 0.8  PROT 4.1*  --  4.1* 3.3*  ALBUMIN 1.7* 1.5* 1.4* 1.2*   Recent Labs  Lab 02/25/21 2253  LIPASE 17   CBC: Recent Labs  Lab 02/25/21 2253 02/26/21 0509 02/27/21 0121 02/28/21 0958 03/01/21 0136  WBC 11.8* 13.4* 13.2* 16.7* 13.0*  NEUTROABS 10.2*  --   --  14.7* 11.1*  HGB 8.8* 8.3* 8.1* 8.9* 8.1*  HCT 28.7* 27.1* 25.9* 28.8* 26.6*  MCV 97.6 97.8 96.6 96.3 100.8*  PLT 291 278 255 224 210   Blood Culture     Component Value Date/Time   SDES BLOOD LEFT FOREARM 12/31/2020 1850   SPECREQUEST  12/31/2020 1850    BOTTLES DRAWN AEROBIC AND ANAEROBIC Blood Culture results may not be optimal due to an inadequate volume of blood received in culture bottles   CULT  12/31/2020 1850    NO GROWTH 5 DAYS Performed at [facility], [address], [place], [place] [address]    REPTSTATUS 01/05/2021 FINAL 12/31/2020 1850    Cardiac Enzymes: No results for input(s): CKTOTAL, CKMB, CKMBINDEX, TROPONINI in the last 168 hours. CBG: No results for input(s): GLUCAP in the last 168 hours. Iron Studies:  Recent Labs    02/27/21 1122  IRON 26*  TIBC QUANTITY NOT SUFFICIENT, UNABLE TO PERFORM TEST  FERRITIN 1,081*   Lab Results  Component Value Date   INR 2.7 (H) 02/28/2021   INR 2.9 (H) 02/27/2021   INR 3.2 (H) 02/26/2021   Studies/Results: No results found.  Medications:  sodium chloride 20 mL/hr at 03/01/21 0800   ferric gluconate (FERRLECIT) IVPB 62.5 mg (02/27/21 1643)    amiodarone  200 mg Oral  Daily   apixaban  5 mg Oral BID   feeding supplement  1 Container Oral TID BM   fidaxomicin  200 mg Oral BID   gabapentin  300 mg Oral QHS   multivitamin  1 tablet Oral QHS    Dialysis Orders: [facility] TTS  4h  400/500  68.5kg   2/2 bath P2  Hep none    AVF / TDC   - venofer 100mg  x 5, has had 2 doses   - mircera 150 q2, last 02/18/21, due 9/15    Assessment/Plan: Acute LGIB - per GI/ pmd A/C diarrhea - recurrent Cdiff, + toxin/ antigen here. ID consulted, on dificid, avoiding other antibiotics and PPI if possible. Improving leukocytosis. ESRD: TTS HD. HD yesterday tolerated well.  Next HD on 03/02/21. Noted patient currently under EDW-may need to adjust at discharge. Hypertension/volume: Blood pressure elevated, not on medication at home.  Continues to have LE edema, although improving. Albumin very low.  Continue to UF and lower dry as tolerated.  Standing weights if possible. 3.5kg under dry  if weights correct post HD yesterday.  Anemia of ESRD: Hgb 8.1. cont IV iron loading here, 3 more doses. Next esa due on 9/15.    Metabolic bone disease: Corrected Ca and phos in goal. Not on binders or VDRA.  Hx of ESBL E.coli UTI - and other pathogens.  Recent L arm compartment syndrome - sp AVF revision / repair.  Currently refusing to use due to ongoing swelling around AVF. Chronic combined CHF Paroxysmal A-Fib - takes Eliquis/amiodarone. Severe aortic stenosis   Tobie Poet, NP [facility] 03/01/2021,3:01 PM  LOS: 3 days    Seen and examined independently.  Agree with note and exam as documented above by physician extender and as noted here.  Still with diarrhea. Spoke with daughter at bedside. She states she doesn't know the plan for AVF and per charting above has refused use recently.   General adult female in bed in no acute distress HEENT normocephalic atraumatic extraocular movements intact sclera anicteric Neck supple trachea midline Lungs clear to auscultation bilaterally normal work of breathing at rest; room air Heart S1S2 no rub Abdomen soft nontender nondistended Extremities no edema  Psych normal mood and affect Access: LUE AVF b/t and RIJ tunn catheter in place  Acute lower GI bleed - per GI and primary   Acute on chronic diarrhea - supportive care per primary team   ESRD on HD TTS schedule Use AVF if pt willing - cleared for use per vascular note 12/11/20  Anemia ESRD - on iron and ESA  Claudia Desanctis, MD 03/01/2021 4:16 PM

## 2021-03-01 NOTE — Progress Notes (Signed)
PROGRESS NOTE                                                                                                                                                                                                             Patient Demographics:    [name], is a 75 y.o. female, DOB - 1945/12/08, ERD:[id number]  Outpatient Primary MD for the patient is Lajean Manes, MD    LOS - 3  Admit date - 02/25/2021    CC - weakness     Brief Narrative (HPI from H&P)  75 year old female with past medical history of end-stage renal disease (HD tues, thurs, sat), severe aortic stenosis, chronic diastolic congestive heart failure (Echo 09/2020 EF 55-60% with G2DD), C. difficile colonization, paroxysmal atrial fibrillation (on eliquis), frequent urinary tract infections on suppression therapy with Keflex with recent ESBL Proteus UTI  (12/2020), anemia of chronic disease, gastroesophageal reflux disease who presents to [facility] emergency department due to progressively worsening diarrhea and weakness, in the ER work-up suggestive of recurrent C. Difficile infection, acute on chronic anemia.  GI and ID were consulted and she was admitted to the hospital.   Subjective:   Patient in bed, appears comfortable, denies any headache, no fever, no chest pain or pressure, no shortness of breath , no abdominal pain. No new focal weakness, diarrhea has improved ++.   Assessment  & Plan :   Acute on Chr. C Diff infection - she has had multiple previous episodes of C. difficile infection, GI and ID on board, currently she is on Dificid, will monitor.  Overall diarrhea has improved.  2.  Acute on chronic anemia.  Some question of rectal bleeding.  Likely due to excessive wiping from diarrhea, H&H currently appears stable, will try to monitor over heparin drip >> Eliquis if H&H stable.  Iron low but ++ Ferritin on An.Panel, PO iron while here, continue  monitor H&H.  3.  History of paroxysmal atrial fibrillation [place] vas 2 score of greater than 3.  Continue combination of amiodarone, heparin instead of Eliquis for now.  4.  ESRD. On TTS schedule.  Nephrology on board.  5.  History of ESBL UTI in the past.  Monitor.  6.  History of severe aortic stenosis.  No acute issues will monitor will avoid large drops in preload or hypotension.  7.  Chronic diastolic CHF.  EF 55% on echocardiogram in April 2020.  Currently compensated.  8.  Severe PCM.  Nutritionist following supplements as needed.  9.  Generalized weakness.  Due to ongoing C. difficile infection and diarrhea.  PT OT and supportive care.  10. Hypomagnesemia - replaced.       Condition - Extremely Guarded  Family Communication  : daughter [name] [phone] - 02/28/21  Code Status : Full code  Consults  : GI, nephrology, ID  PUD Prophylaxis : None   Procedures  :           Disposition Plan  :    Status is: Inpatient  Remains inpatient appropriate because:IV treatments appropriate due to intensity of illness or inability to take PO  Dispo: The patient is from: Home              Anticipated d/c is to: Home              Patient currently not stable, likely HHPT DC 03/03/21   Difficult to place patient No  DVT Prophylaxis  :    SCDs Start: 02/26/21 0421 apixaban (ELIQUIS) tablet 5 mg    Lab Results  Component Value Date   PLT 210 03/01/2021    Diet :  Diet Order             DIET SOFT Room service appropriate? Yes; Fluid consistency: Thin  Diet effective now                    Inpatient Medications  Scheduled Meds:  amiodarone  200 mg Oral Daily   apixaban  5 mg Oral BID   feeding supplement  1 Container Oral TID BM   fidaxomicin  200 mg Oral BID   gabapentin  300 mg Oral QHS   multivitamin  1 tablet Oral QHS   Continuous Infusions:  sodium chloride 20 mL/hr at 02/28/21 1400   ferric gluconate (FERRLECIT) IVPB 62.5 mg (02/27/21 1643)    PRN Meds:.acetaminophen **OR** acetaminophen, ondansetron (ZOFRAN) IV  Antibiotics  :    Anti-infectives (From admission, onward)    Start     Dose/Rate Route Frequency Ordered Stop   04/24/21 1000  vancomycin (VANCOCIN) capsule 125 mg  Status:  Discontinued       See Hyperspace for full Linked Orders Report.   125 mg Oral Every 3 DAYS 02/26/21 1159 02/26/21 1405   03/27/21 1000  vancomycin (VANCOCIN) capsule 125 mg  Status:  Discontinued       See Hyperspace for full Linked Orders Report.   125 mg Oral Every other day 02/26/21 1159 02/26/21 1405   03/20/21 1000  vancomycin (VANCOCIN) capsule 125 mg  Status:  Discontinued       See Hyperspace for full Linked Orders Report.   125 mg Oral Daily 02/26/21 1159 02/26/21 1405   03/12/21 2200  vancomycin (VANCOCIN) capsule 125 mg  Status:  Discontinued       See Hyperspace for full Linked Orders Report.   125 mg Oral 2 times daily 02/26/21 1159 02/26/21 1405   02/26/21 1415  fidaxomicin (DIFICID) tablet 200 mg        200 mg Oral 2 times daily 02/26/21 1405 03/08/21 0959   02/26/21 1400  vancomycin (VANCOCIN) capsule 125 mg  Status:  Discontinued       See Hyperspace for full Linked Orders Report.   125 mg Oral 4 times daily 02/26/21 1159 02/26/21 1405  02/26/21 0000  fidaxomicin (DIFICID) 200 MG TABS tablet       Note to Pharmacy: This is for a patient at [place] and copay assistance already loaded in [facility]   200 mg Oral 2 times daily 02/26/21 1408          Time Spent in minutes  30   Lala Lund M.D on 03/01/2021 at 11:06 AM  To page go to [email or web address]   [facility] -  Office  [phone]    See all Orders from today for further details    Objective:   Vitals:   02/28/21 0833 02/28/21 1701 02/28/21 2100 03/01/21 0930  BP: (!) 159/43 (!) 146/60 (!) 154/48 (!) 142/48  Pulse: 70 74 70 68  Resp: 17 17 18 15   Temp: 97.7 F (36.5 C) 99.3 F (37.4 C) 99.1 F (37.3 C) 97.8 F (36.6  C)  TempSrc: Oral Oral Oral Oral  SpO2: 94%  92% 93%  Weight:      Height:        Wt Readings from Last 3 Encounters:  02/27/21 65 kg  01/09/21 66.5 kg  12/02/20 73.1 kg     Intake/Output Summary (Last 24 hours) at 03/01/2021 1106 Last data filed at 03/01/2021 0300 Gross per 24 hour  Intake 980 ml  Output --  Net 980 ml     Physical Exam  Awake Alert, No new F.N deficits, Normal affect [place].AT,PERRAL Supple Neck,No JVD, No cervical lymphadenopathy appriciated.  Symmetrical Chest wall movement, Good air movement bilaterally, CTAB RRR,No Gallops,  chronic systolic aortic murmur +ve B.Sounds, Abd Soft, No tenderness, No organomegaly appriciated, No rebound - guarding or rigidity. No Cyanosis, Clubbing or edema, No new Rash or bruise     Data Review:    CBC Recent Labs  Lab 02/25/21 2253 02/26/21 0509 02/27/21 0121 02/28/21 0958 03/01/21 0136  WBC 11.8* 13.4* 13.2* 16.7* 13.0*  HGB 8.8* 8.3* 8.1* 8.9* 8.1*  HCT 28.7* 27.1* 25.9* 28.8* 26.6*  PLT 291 278 255 224 210  MCV 97.6 97.8 96.6 96.3 100.8*  MCH 29.9 30.0 30.2 29.8 30.7  MCHC 30.7 30.6 31.3 30.9 30.5  RDW 20.8* 21.2* 21.9* 22.1* 22.1*  LYMPHSABS 0.7  --   --  0.7 0.8  MONOABS 0.7  --   --  1.0 0.8  EOSABS 0.1  --   --  0.2 0.1  BASOSABS 0.1  --   --  0.1 0.1    Recent Labs  Lab 02/25/21 2253 02/26/21 0509 02/27/21 0121 02/27/21 1431 02/28/21 0958 03/01/21 0136  NA 137 136  --  135 136 137  K 3.7 3.8  --  4.3 4.2 4.6  CL 98 98  --  101 101 103  CO2 31 31  --  29 29 25   GLUCOSE 81 85  --  122* 112* 89  BUN 8 9  --  13 12 17   CREATININE 2.11* 2.27*  --  3.51* 3.13* 3.74*  CALCIUM 7.7* 7.7*  --  7.6* 7.7* 7.4*  AST 21 19  --   --  16 16  ALT 16 15  --   --  12 10  ALKPHOS 97 93  --   --  80 70  BILITOT 0.9 1.0  --   --  0.9 0.8  ALBUMIN 1.8* 1.7*  --  1.5* 1.4* 1.2*  MG  --   --   --   --  1.6* 2.1  INR  --  3.2* 2.9*  --  2.7*  --   BNP  --   --   --   --  1,688.3* 10.6     ------------------------------------------------------------------------------------------------------------------ No results for input(s): CHOL, HDL, LDLCALC, TRIG, CHOLHDL, LDLDIRECT in the last 72 hours.  Lab Results  Component Value Date   HGBA1C 5.6 11/13/2014   ------------------------------------------------------------------------------------------------------------------ No results for input(s): TSH, T4TOTAL, T3FREE, THYROIDAB in the last 72 hours.  Invalid input(s): FREET3  Cardiac Enzymes No results for input(s): CKMB, TROPONINI, MYOGLOBIN in the last 168 hours.  Invalid input(s): CK ------------------------------------------------------------------------------------------------------------------    Component Value Date/Time   BNP 10.6 03/01/2021 0136    Radiology Reports DG Chest Portable 1 View  Result Date: 02/25/2021 CLINICAL DATA:  Fatigue, weakness EXAM: PORTABLE CHEST 1 VIEW COMPARISON:  12/31/2020 FINDINGS: Right dialysis catheter remains in place, unchanged. Cardiomegaly with vascular congestion. Perihilar and lower lobe airspace disease, likely edema. Suspect layering effusions. IMPRESSION: Cardiomegaly with mild CHF.  Suspect layering effusions. Electronically Signed   By: Rolm Baptise M.D.   On: 02/25/2021 19:34

## 2021-03-01 NOTE — Plan of Care (Signed)
  Problem: Education: Goal: Knowledge of General Education information will improve Description: Including pain rating scale, medication(s)/side effects and non-pharmacologic comfort measures Outcome: Progressing   Problem: Clinical Measurements: Goal: Ability to maintain clinical measurements within normal limits will improve Outcome: Progressing Goal: Will remain free from infection Outcome: Progressing Goal: Diagnostic test results will improve Outcome: Progressing Goal: Respiratory complications will improve Outcome: Progressing Goal: Cardiovascular complication will be avoided Outcome: Progressing   Problem: Activity: Goal: Risk for activity intolerance will decrease Outcome: Progressing   Problem: Nutrition: Goal: Adequate nutrition will be maintained Outcome: Progressing   Problem: Coping: Goal: Level of anxiety will decrease Outcome: Progressing   Problem: Elimination: Goal: Will not experience complications related to urinary retention Outcome: Progressing   Problem: Pain Managment: Goal: General experience of comfort will improve Outcome: Progressing   Problem: Safety: Goal: Ability to remain free from injury will improve Outcome: Progressing   Problem: Skin Integrity: Goal: Risk for impaired skin integrity will decrease Outcome: Progressing

## 2021-03-02 LAB — COMPREHENSIVE METABOLIC PANEL
ALT: 12 U/L (ref 0–44)
AST: 12 U/L — ABNORMAL LOW (ref 15–41)
Albumin: 1.4 g/dL — ABNORMAL LOW (ref 3.5–5.0)
Alkaline Phosphatase: 104 U/L (ref 38–126)
Anion gap: 10 (ref 5–15)
BUN: 22 mg/dL (ref 8–23)
CO2: 25 mmol/L (ref 22–32)
Calcium: 7.7 mg/dL — ABNORMAL LOW (ref 8.9–10.3)
Chloride: 99 mmol/L (ref 98–111)
Creatinine, Ser: 4.87 mg/dL — ABNORMAL HIGH (ref 0.44–1.00)
GFR, Estimated: 9 mL/min — ABNORMAL LOW (ref >60)
Glucose, Bld: 128 mg/dL — ABNORMAL HIGH (ref 70–99)
Potassium: 4.5 mmol/L (ref 3.5–5.1)
Sodium: 134 mmol/L — ABNORMAL LOW (ref 135–145)
Total Bilirubin: 0.8 mg/dL (ref 0.3–1.2)
Total Protein: 3.7 g/dL — ABNORMAL LOW (ref 6.5–8.1)

## 2021-03-02 LAB — CBC WITH DIFFERENTIAL/PLATELET
Abs Immature Granulocytes: 0 10*3/uL (ref 0.00–0.07)
Basophils Absolute: 0.2 10*3/uL — ABNORMAL HIGH (ref 0.0–0.1)
Basophils Relative: 1 %
Eosinophils Absolute: 0.7 10*3/uL — ABNORMAL HIGH (ref 0.0–0.5)
Eosinophils Relative: 4 %
HCT: 29.9 % — ABNORMAL LOW (ref 36.0–46.0)
Hemoglobin: 9.2 g/dL — ABNORMAL LOW (ref 12.0–15.0)
Lymphocytes Relative: 3 %
Lymphs Abs: 0.5 10*3/uL — ABNORMAL LOW (ref 0.7–4.0)
MCH: 29.9 pg (ref 26.0–34.0)
MCHC: 30.8 g/dL (ref 30.0–36.0)
MCV: 97.1 fL (ref 80.0–100.0)
Monocytes Absolute: 0 10*3/uL — ABNORMAL LOW (ref 0.1–1.0)
Monocytes Relative: 0 %
Neutro Abs: 15.3 10*3/uL — ABNORMAL HIGH (ref 1.7–7.7)
Neutrophils Relative %: 92 %
Platelets: 254 10*3/uL (ref 150–400)
RBC: 3.08 MIL/uL — ABNORMAL LOW (ref 3.87–5.11)
RDW: 21.6 % — ABNORMAL HIGH (ref 11.5–15.5)
WBC: 16.6 10*3/uL — ABNORMAL HIGH (ref 4.0–10.5)
nRBC: 0 % (ref 0.0–0.2)
nRBC: 0 /100 WBC

## 2021-03-02 LAB — STOOL CULTURE: E coli, Shiga toxin Assay: NEGATIVE

## 2021-03-02 LAB — HEPATITIS B SURFACE ANTIBODY,QUALITATIVE: Hep B S Ab: REACTIVE — AB

## 2021-03-02 LAB — BRAIN NATRIURETIC PEPTIDE: B Natriuretic Peptide: 2034.8 pg/mL — ABNORMAL HIGH (ref 0.0–100.0)

## 2021-03-02 LAB — STOOL CULTURE REFLEX - CMPCXR

## 2021-03-02 LAB — STOOL CULTURE REFLEX - RSASHR

## 2021-03-02 LAB — HEPATITIS B SURFACE ANTIGEN: Hepatitis B Surface Ag: NONREACTIVE

## 2021-03-02 LAB — MAGNESIUM: Magnesium: 2.1 mg/dL (ref 1.7–2.4)

## 2021-03-02 MED ORDER — ALBUMIN HUMAN 25 % IV SOLN
INTRAVENOUS | Status: AC
  Filled 2021-03-02: qty 100

## 2021-03-02 MED ORDER — HYDRALAZINE HCL 20 MG/ML IJ SOLN: 10.0000 mg | Freq: Once | INTRAMUSCULAR | Status: DC

## 2021-03-02 MED ORDER — ALBUMIN HUMAN 25 % IV SOLN
25.0000 g | Freq: Once | INTRAVENOUS | Status: AC
  Administered 2021-03-02: 25 g via INTRAVENOUS

## 2021-03-02 NOTE — Progress Notes (Signed)
Pt left with transporter for HD tx. Pt alert/oriented in no apparent distress. No complaints.

## 2021-03-02 NOTE — Progress Notes (Signed)
BP elevated to 180/62, on call provider notified got hydralazine order. Report already given to charge nurse to further follow up for  medication and BP.

## 2021-03-02 NOTE — Progress Notes (Signed)
PT Cancellation Note  Patient Details Name: [name] MRN: [id number] DOB: July 26, 1945   Cancelled Treatment:    Reason Eval/Treat Not Completed: Other (comment).  Reports she has been quite nauseated all day and now is waiting for meds for same.  Declines even bed level treatment today, so will retry as time and pt permit tomorrow.   Ramond Dial 03/02/2021, 3:23 PM  Mee Hives, PT MS Acute Rehab Dept. Number: [place] and [place]

## 2021-03-02 NOTE — Progress Notes (Signed)
PROGRESS NOTE                                                                                                                                                                                                             Patient Demographics:    [name], is a 75 y.o. female, DOB - 10/19/1945, TKP:[id number]  Outpatient Primary MD for the patient is Lajean Manes, MD    LOS - 4  Admit date - 02/25/2021    CC - weakness     Brief Narrative (HPI from H&P)  75 year old female with past medical history of end-stage renal disease (HD tues, thurs, sat), severe aortic stenosis, chronic diastolic congestive heart failure (Echo 09/2020 EF 55-60% with G2DD), C. difficile colonization, paroxysmal atrial fibrillation (on eliquis), frequent urinary tract infections on suppression therapy with Keflex with recent ESBL Proteus UTI  (12/2020), anemia of chronic disease, gastroesophageal reflux disease who presents to [facility] emergency department due to progressively worsening diarrhea and weakness, in the ER work-up suggestive of recurrent C. Difficile infection, acute on chronic anemia.  GI and ID were consulted and she was admitted to the hospital.   Subjective:   Patient in bed, appears comfortable, denies any headache, no fever, no chest pain or pressure, no shortness of breath , no abdominal pain. No new focal weakness, diarrhea has improved ++.   Assessment  & Plan :   Acute on Chr. C Diff infection - she has had multiple previous episodes of C. difficile infection, GI and ID on board, currently she is on Dificid, will monitor.  Overall diarrhea has improved ++.  2.  Acute on chronic anemia.  Some question of rectal bleeding.  Likely due to excessive wiping from diarrhea, H&H currently appears stable, will try to monitor over heparin drip >> Eliquis if H&H stable.  Iron low but ++ Ferritin on An.Panel, PO iron while here,  continue monitor H&H.  3.  History of paroxysmal atrial fibrillation [place] vas 2 score of greater than 3.  Continue combination of amiodarone, heparin instead of Eliquis for now.  4.  ESRD. On TTS schedule.  Nephrology on board.  5.  History of ESBL UTI in the past.  Monitor.  6.  History of severe aortic stenosis.  No acute issues will monitor will avoid large drops in preload or hypotension.  7.  Chronic diastolic CHF.  EF 55% on echocardiogram in April 2020.  Currently compensated.  8.  Severe PCM.  Nutritionist following supplements as needed.  9.  Generalized weakness.  Due to ongoing C. difficile infection and diarrhea.  PT OT and supportive care.  10. Hypomagnesemia - replaced.       Condition - Extremely Guarded  Family Communication  : daughter [name] [phone] - 02/28/21, 03/02/21 - wants HHPT and not SNF  Code Status : Full code  Consults  : GI, nephrology, ID  PUD Prophylaxis : None   Procedures  :           Disposition Plan  :    Status is: Inpatient  Remains inpatient appropriate because:IV treatments appropriate due to intensity of illness or inability to take PO  Dispo: The patient is from: Home              Anticipated d/c is to: Home              Patient currently not stable, likely HHPT DC 03/03/21   Difficult to place patient No  DVT Prophylaxis  :    SCDs Start: 02/26/21 0421 apixaban (ELIQUIS) tablet 5 mg    Lab Results  Component Value Date   PLT 254 03/02/2021    Diet :  Diet Order             DIET SOFT Room service appropriate? Yes; Fluid consistency: Thin  Diet effective now                    Inpatient Medications  Scheduled Meds:  amiodarone  200 mg Oral Daily   apixaban  5 mg Oral BID   feeding supplement  1 Container Oral TID BM   fidaxomicin  200 mg Oral BID   gabapentin  300 mg Oral QHS   hydrALAZINE  10 mg Intravenous Once   multivitamin  1 tablet Oral QHS   Continuous Infusions:  sodium chloride 20  mL/hr at 03/02/21 0434   sodium chloride     sodium chloride     ferric gluconate (FERRLECIT) IVPB 62.5 mg (02/27/21 1643)   PRN Meds:.sodium chloride, sodium chloride, acetaminophen **OR** acetaminophen, alteplase, heparin, lidocaine (PF), lidocaine-prilocaine, ondansetron (ZOFRAN) IV, pentafluoroprop-tetrafluoroeth  Antibiotics  :    Anti-infectives (From admission, onward)    Start     Dose/Rate Route Frequency Ordered Stop   04/24/21 1000  vancomycin (VANCOCIN) capsule 125 mg  Status:  Discontinued       See Hyperspace for full Linked Orders Report.   125 mg Oral Every 3 DAYS 02/26/21 1159 02/26/21 1405   03/27/21 1000  vancomycin (VANCOCIN) capsule 125 mg  Status:  Discontinued       See Hyperspace for full Linked Orders Report.   125 mg Oral Every other day 02/26/21 1159 02/26/21 1405   03/20/21 1000  vancomycin (VANCOCIN) capsule 125 mg  Status:  Discontinued       See Hyperspace for full Linked Orders Report.   125 mg Oral Daily 02/26/21 1159 02/26/21 1405   03/12/21 2200  vancomycin (VANCOCIN) capsule 125 mg  Status:  Discontinued       See Hyperspace for full Linked Orders Report.   125 mg Oral 2 times daily 02/26/21 1159 02/26/21 1405   02/26/21 1415  fidaxomicin (DIFICID) tablet 200 mg        200 mg Oral 2 times daily 02/26/21 1405 03/08/21 0959   02/26/21 1400  vancomycin (VANCOCIN) capsule 125 mg  Status:  Discontinued       See Hyperspace for full Linked Orders Report.   125 mg Oral 4 times daily 02/26/21 1159 02/26/21 1405   02/26/21 0000  fidaxomicin (DIFICID) 200 MG TABS tablet       Note to Pharmacy: This is for a patient at [place] and copay assistance already loaded in [facility]   200 mg Oral 2 times daily 02/26/21 1408          Time Spent in minutes  30   Lala Lund M.D on 03/02/2021 at 9:52 AM  To page go to [email or web address]   [facility] -  Office  [phone]    See all Orders from today for further details     Objective:   Vitals:   03/02/21 0901 03/02/21 0920 03/02/21 0921 03/02/21 0930  BP: (!) 142/57 (!) 179/91 (!) 164/67 (!) 173/65  Pulse:  75 74 76  Resp:  15 13 17   Temp: 98 F (36.7 C) 97.9 F (36.6 C)    TempSrc: Oral Oral    SpO2: 95% 98%    Weight: 69.1 kg 69.1 kg    Height:        Wt Readings from Last 3 Encounters:  03/02/21 69.1 kg  01/09/21 66.5 kg  12/02/20 73.1 kg     Intake/Output Summary (Last 24 hours) at 03/02/2021 0952 Last data filed at 03/01/2021 2154 Gross per 24 hour  Intake 720 ml  Output 0 ml  Net 720 ml     Physical Exam  Awake Alert, No new F.N deficits, Normal affect [place].AT,PERRAL Supple Neck,No JVD, No cervical lymphadenopathy appriciated.  Symmetrical Chest wall movement, Good air movement bilaterally, CTAB RRR,No Gallops, chronic systolic aortic murmur +ve B.Sounds, Abd Soft, No tenderness, No organomegaly appriciated, No rebound - guarding or rigidity. No Cyanosis, Clubbing or edema, No new Rash or bruise      Data Review:    CBC Recent Labs  Lab 02/25/21 2253 02/26/21 0509 02/27/21 0121 02/28/21 0958 03/01/21 0136 03/02/21 0247  WBC 11.8* 13.4* 13.2* 16.7* 13.0* 16.6*  HGB 8.8* 8.3* 8.1* 8.9* 8.1* 9.2*  HCT 28.7* 27.1* 25.9* 28.8* 26.6* 29.9*  PLT 291 278 255 224 210 254  MCV 97.6 97.8 96.6 96.3 100.8* 97.1  MCH 29.9 30.0 30.2 29.8 30.7 29.9  MCHC 30.7 30.6 31.3 30.9 30.5 30.8  RDW 20.8* 21.2* 21.9* 22.1* 22.1* 21.6*  LYMPHSABS 0.7  --   --  0.7 0.8 0.5*  MONOABS 0.7  --   --  1.0 0.8 0.0*  EOSABS 0.1  --   --  0.2 0.1 0.7*  BASOSABS 0.1  --   --  0.1 0.1 0.2*    Recent Labs  Lab 02/25/21 2253 02/26/21 0509 02/27/21 0121 02/27/21 1431 02/28/21 0958 03/01/21 0136 03/02/21 0247  NA 137 136  --  135 136 137 134*  K 3.7 3.8  --  4.3 4.2 4.6 4.5  CL 98 98  --  101 101 103 99  CO2 31 31  --  29 29 25 25   GLUCOSE 81 85  --  122* 112* 89 128*  BUN 8 9  --  13 12 17 22   CREATININE 2.11* 2.27*  --  3.51* 3.13* 3.74*  4.87*  CALCIUM 7.7* 7.7*  --  7.6* 7.7* 7.4* 7.7*  AST 21 19  --   --  16 16 12*  ALT 16 15  --   --  12 10 12   ALKPHOS 97 93  --   --  80 70 104  BILITOT 0.9 1.0  --   --  0.9 0.8 0.8  ALBUMIN 1.8* 1.7*  --  1.5* 1.4* 1.2* 1.4*  MG  --   --   --   --  1.6* 2.1 2.1  INR  --  3.2* 2.9*  --  2.7*  --   --   BNP  --   --   --   --  1,688.3* 10.6 2,034.8*    ------------------------------------------------------------------------------------------------------------------ No results for input(s): CHOL, HDL, LDLCALC, TRIG, CHOLHDL, LDLDIRECT in the last 72 hours.  Lab Results  Component Value Date   HGBA1C 5.6 11/13/2014   ------------------------------------------------------------------------------------------------------------------ No results for input(s): TSH, T4TOTAL, T3FREE, THYROIDAB in the last 72 hours.  Invalid input(s): FREET3  Cardiac Enzymes No results for input(s): CKMB, TROPONINI, MYOGLOBIN in the last 168 hours.  Invalid input(s): CK ------------------------------------------------------------------------------------------------------------------    Component Value Date/Time   BNP 2,034.8 (H) 03/02/2021 0247    Radiology Reports DG Chest Portable 1 View  Result Date: 02/25/2021 CLINICAL DATA:  Fatigue, weakness EXAM: PORTABLE CHEST 1 VIEW COMPARISON:  12/31/2020 FINDINGS: Right dialysis catheter remains in place, unchanged. Cardiomegaly with vascular congestion. Perihilar and lower lobe airspace disease, likely edema. Suspect layering effusions. IMPRESSION: Cardiomegaly with mild CHF.  Suspect layering effusions. Electronically Signed   By: Rolm Baptise M.D.   On: 02/25/2021 19:34

## 2021-03-02 NOTE — Progress Notes (Addendum)
[name] Progress Note   Subjective:    Patient seen and examined on HD unit. Informed by HD RN of patient c/o nausea during HD. UFG increased-patient with LE edema. Informed patient did not tolerate UFG 2.5L-blood pressures dropped sys 70s-90s. UF held and fluids given. 25Gm IV Albumin also given. Net UF 865ml today.  Objective Vitals:   03/02/21 1300 03/02/21 1305 03/02/21 1310 03/02/21 1341  BP: (!) 148/67 (!) 158/57 (!) 180/64 (!) 144/49  Pulse: 64 68 70 72  Resp: 13 12 14    Temp:   (!) 97 F (36.1 C) 97.8 F (36.6 C)  TempSrc:   Oral Oral  SpO2:    95%  Weight:   65.2 kg   Height:       Physical Exam General:chronically ill appearing female in NAD Heart:RRR, +5/7 systolic murmur Lungs:CTAB, breath sounds decreased in bases, nml WOB on RA Abdomen:soft, non-tender, active bowel sounds Extremities:1+ LE edema Dialysis Access: TDC, LU AVF +b/t   Filed Weights   03/02/21 0901 03/02/21 0920 03/02/21 1310  Weight: 69.1 kg 69.1 kg 65.2 kg    Intake/Output Summary (Last 24 hours) at 03/02/2021 1516 Last data filed at 03/02/2021 1310 Gross per 24 hour  Intake 480 ml  Output 854 ml  Net -374 ml    Additional Objective Labs: Basic Metabolic Panel: Recent Labs  Lab 02/27/21 1431 02/28/21 0958 03/01/21 0136 03/02/21 0247  NA 135 136 137 134*  K 4.3 4.2 4.6 4.5  CL 101 101 103 99  CO2 29 29 25 25   GLUCOSE 122* 112* 89 128*  BUN 13 12 17 22   CREATININE 3.51* 3.13* 3.74* 4.87*  CALCIUM 7.6* 7.7* 7.4* 7.7*  PHOS 2.7  --   --   --    Liver Function Tests: Recent Labs  Lab 02/28/21 0958 03/01/21 0136 03/02/21 0247  AST 16 16 12*  ALT 12 10 12   ALKPHOS 80 70 104  BILITOT 0.9 0.8 0.8  PROT 4.1* 3.3* 3.7*  ALBUMIN 1.4* 1.2* 1.4*   Recent Labs  Lab 02/25/21 2253  LIPASE 17   CBC: Recent Labs  Lab 02/26/21 0509 02/27/21 0121 02/28/21 0958 03/01/21 0136 03/02/21 0247  WBC 13.4* 13.2* 16.7* 13.0* 16.6*  NEUTROABS  --   --  14.7* 11.1*  15.3*  HGB 8.3* 8.1* 8.9* 8.1* 9.2*  HCT 27.1* 25.9* 28.8* 26.6* 29.9*  MCV 97.8 96.6 96.3 100.8* 97.1  PLT 278 255 224 210 254   Blood Culture    Component Value Date/Time   SDES BLOOD LEFT FOREARM 12/31/2020 1850   SPECREQUEST  12/31/2020 1850    BOTTLES DRAWN AEROBIC AND ANAEROBIC Blood Culture results may not be optimal due to an inadequate volume of blood received in culture bottles   CULT  12/31/2020 1850    NO GROWTH 5 DAYS Performed at [facility], [address] [place], [place] [address]    REPTSTATUS 01/05/2021 FINAL 12/31/2020 1850    Cardiac Enzymes: No results for input(s): CKTOTAL, CKMB, CKMBINDEX, TROPONINI in the last 168 hours. CBG: No results for input(s): GLUCAP in the last 168 hours. Iron Studies: No results for input(s): IRON, TIBC, TRANSFERRIN, FERRITIN in the last 72 hours. Lab Results  Component Value Date   INR 2.7 (H) 02/28/2021   INR 2.9 (H) 02/27/2021   INR 3.2 (H) 02/26/2021   Studies/Results: No results found.  Medications:  sodium chloride 20 mL/hr at 03/02/21 0434   ferric gluconate (FERRLECIT) IVPB Stopped (03/02/21 1304)  amiodarone  200 mg Oral Daily   apixaban  5 mg Oral BID   feeding supplement  1 Container Oral TID BM   fidaxomicin  200 mg Oral BID   gabapentin  300 mg Oral QHS   hydrALAZINE  10 mg Intravenous Once   multivitamin  1 tablet Oral QHS    Dialysis Orders: [facility] TTS  4h  400/500  68.5kg   2/2 bath P2  Hep none    AVF / TDC   - venofer 100mg  x 5, has had 2 doses   - mircera 150 q2, last 02/18/21, due 9/15    Assessment/Plan: Acute LGIB - per GI/ pmd A/C diarrhea - recurrent Cdiff, + toxin/ antigen here. ID consulted, on dificid, avoiding other antibiotics and PPI if possible. Improving leukocytosis. ESRD: TTS HD. HD today-only removed 851ml d/t episodes hypotension and nausea-IV Albumin given. Continue UF as tolerated and Albumin PRN if indicated. Hypertension/volume: Blood pressure dropped during HD. Not  on antihypertensives at home.  Continues to have LE edema, although improving. Albumin very low.  Continue to UF and lower dry as tolerated.  Standing weights if possible.  Anemia of ESRD: Hgb slowly improving-now 9.2. cont IV iron loading here. Next esa due on 9/15.    Metabolic bone disease: Corrected Ca and phos in goal. Not on binders or VDRA.  Hx of ESBL E.coli UTI - and other pathogens.  Recent L arm compartment syndrome - sp AVF revision / repair.  Currently refusing to use due to ongoing swelling around AVF. Chronic combined CHF Paroxysmal A-Fib - takes Eliquis/amiodarone. Severe aortic stenosis   Amy Poet, NP [facility] 03/02/2021,3:16 PM  LOS: 4 days     Seen and examined independently.  Agree with note and exam as documented above by physician extender and as noted here.  See also HD note from today.   Claudia Desanctis, MD 03/02/2021  4:06 PM

## 2021-03-02 NOTE — Progress Notes (Signed)
PT Cancellation Note  Patient Details Name: CHRISTIEN BERTHELOT MRN: [id number] DOB: [birth year]   Cancelled Treatment:    Reason Eval/Treat Not Completed: Patient at procedure or test/unavailable in HD. Will attempt to return if time/schedule allow.   Windell Norfolk, DPT, PN2   Supplemental Physical Therapist [place]    Pager [phone] Acute Rehab Office [phone]

## 2021-03-02 NOTE — Progress Notes (Signed)
Pt arrived to unit s/p HD tx. Pt remains alert/oriented in no apparent distress. Pt c/o nauseated. Will give prn med if due/ and monitor

## 2021-03-02 NOTE — Discharge Instructions (Addendum)
Follow with Primary MD Lajean Manes, MD in 7 days   Get CBC, CMP, Anaemia Panel -  checked next visit within 1 week by Primary MD    Activity: As tolerated with Full fall precautions use walker/cane & assistance as needed  Disposition Home     Diet: Renal - 1.5 lit/day fluid restriction  Special Instructions: If you have smoked or chewed Tobacco  in the last 2 yrs please stop smoking, stop any regular Alcohol  and or any Recreational drug use.  On your next visit with your primary care physician please Get Medicines reviewed and adjusted.  Please request your Prim.MD to go over all Hospital Tests and Procedure/Radiological results at the follow up, please get all Hospital records sent to your Prim MD by signing hospital release before you go home.  If you experience worsening of your admission symptoms, develop shortness of breath, life threatening emergency, suicidal or homicidal thoughts you must seek medical attention immediately by calling 911 or calling your MD immediately  if symptoms less severe.  You Must read complete instructions/literature along with all the possible adverse reactions/side effects for all the Medicines you take and that have been prescribed to you. Take any new Medicines after you have completely understood and accpet all the possible adverse reactions/side effects.     Information on my medicine - ELIQUIS (apixaban) Why was Eliquis prescribed for you? Eliquis was prescribed for you to reduce the risk of a blood clot forming that can cause a stroke if you have a medical condition called atrial fibrillation (a type of irregular heartbeat).  What do You need to know about Eliquis ? Take your Eliquis TWICE DAILY - one tablet in the morning and one tablet in the evening with or without food. If you have difficulty swallowing the tablet whole please discuss with your pharmacist how to take the medication safely.  Take Eliquis exactly as prescribed by your  doctor and DO NOT stop taking Eliquis without talking to the doctor who prescribed the medication.  Stopping may increase your risk of developing a stroke.  Refill your prescription before you run out.  After discharge, you should have regular check-up appointments with your healthcare provider that is prescribing your Eliquis.  In the future your dose may need to be changed if your kidney function or weight changes by a significant amount or as you get older.  What do you do if you miss a dose? If you miss a dose, take it as soon as you remember on the same day and resume taking twice daily.  Do not take more than one dose of ELIQUIS at the same time to make up a missed dose.  Important Safety Information A possible side effect of Eliquis is bleeding. You should call your healthcare provider right away if you experience any of the following: Bleeding from an injury or your nose that does not stop. Unusual colored urine (red or dark brown) or unusual colored stools (red or black). Unusual bruising for unknown reasons. A serious fall or if you hit your head (even if there is no bleeding).  Some medicines may interact with Eliquis and might increase your risk of bleeding or clotting while on Eliquis. To help avoid this, consult your healthcare provider or pharmacist prior to using any new prescription or non-prescription medications, including herbals, vitamins, non-steroidal anti-inflammatory drugs (NSAIDs) and supplements.  This website has more information on Eliquis (apixaban): http://www.eliquis.com/eliquis/home

## 2021-03-02 NOTE — Procedures (Signed)
Seen and examined on dialysis.  Procedure supervised.  Blood pressure 173/65 and HR 76.  RIJ tunn catheter in use. Tolerating goal.  Progress note to follow.  I increased UF goal to 2.5 kg - states she normally pulls 2.5 kg outpatient and had only been set for 1 kg.   Claudia Desanctis, MD 03/02/2021 9:46 AM

## 2021-03-02 NOTE — Progress Notes (Signed)
   03/02/21 1310  Vitals  Temp (!) 97 F (36.1 C)  Temp Source Oral  BP (!) 180/64  BP Location Right Leg  BP Method Automatic  Patient Position (if appropriate) Lying  Pulse Rate 70  Pulse Rate Source Monitor  Resp 14  Oxygen Therapy  O2 Device Room Air  Pain Assessment  Pain Scale 0-10  Pain Score 0  Dialysis Weight  Weight 65.2 kg  Type of Weight Post-Dialysis  Post-Hemodialysis Assessment  Rinseback Volume (mL) 250 mL  KECN 281 V  Dialyzer Clearance Lightly streaked  Duration of HD Treatment -hour(s) 3.5 hour(s)  Hemodialysis Intake (mL) 800 mL  UF Total -Machine (mL) 1654 mL  Net UF (mL) 854 mL  Tolerated HD Treatment No (Comment)  Post-Hemodialysis Comments tx complete-pt stable  Hemodialysis Catheter Right Subclavian  Placement Date/Time: 10/27/20 0630   Placed prior to admission: Yes  Orientation: Right  Access Location: Subclavian  Site Condition No complications  Blue Lumen Status Flushed;Heparin locked;Dead end cap in place  Red Lumen Status Flushed;Heparin locked;Dead end cap in place  Catheter fill solution Heparin 1000 units/ml  Catheter fill volume (Arterial) 1.6 cc  Catheter fill volume (Venous) 1.6  Dressing Type Transparent  Dressing Status Clean;Dry;Intact  Antimicrobial disc in place? Yes  Interventions Antimicrobial disc changed;New dressing  Drainage Description None  Dressing Change Due 03/09/21  Post treatment catheter status Capped and Clamped  Pt noted to be nauseated halfway and throughout end of hd tx. Zofran 4mg  IV with minimal effects noted. Denies pain. Per Dr. Royce Macadamia at beginning of tx, UF goal increased to 2-2.5 lters due to edema to lower extremities.SBP dropped to 70s last hour of tx. UF paused intermittently throughout tx due to BP c/o nausea. Per Dr. Royce Macadamia albumin 25 g given IV with effectiveness to BP. Per Dr. Royce Macadamia continue to attempt to pull fluid as long as pt can tolerate. Pt unable to tolerate pulling fluid due to nausea.  [facility]-PA on unit and made aware. Last 45 minutes of hd tx completed with UF off. Courtney-PA on unit and assessed pt prior to transfer back to room.

## 2021-03-03 ENCOUNTER — Other Ambulatory Visit (HOSPITAL_COMMUNITY): Payer: Self-pay

## 2021-03-03 LAB — CBC WITH DIFFERENTIAL/PLATELET
Abs Immature Granulocytes: 0.04 10*3/uL (ref 0.00–0.07)
Basophils Absolute: 0 10*3/uL (ref 0.0–0.1)
Basophils Relative: 0 %
Eosinophils Absolute: 0.1 10*3/uL (ref 0.0–0.5)
Eosinophils Relative: 1 %
HCT: 24.1 % — ABNORMAL LOW (ref 36.0–46.0)
Hemoglobin: 7.5 g/dL — ABNORMAL LOW (ref 12.0–15.0)
Immature Granulocytes: 0 %
Lymphocytes Relative: 6 %
Lymphs Abs: 0.7 10*3/uL (ref 0.7–4.0)
MCH: 29.6 pg (ref 26.0–34.0)
MCHC: 31.1 g/dL (ref 30.0–36.0)
MCV: 95.3 fL (ref 80.0–100.0)
Monocytes Absolute: 1 10*3/uL (ref 0.1–1.0)
Monocytes Relative: 9 %
Neutro Abs: 9 10*3/uL — ABNORMAL HIGH (ref 1.7–7.7)
Neutrophils Relative %: 84 %
Platelets: 175 10*3/uL (ref 150–400)
RBC: 2.53 MIL/uL — ABNORMAL LOW (ref 3.87–5.11)
RDW: 21.2 % — ABNORMAL HIGH (ref 11.5–15.5)
WBC: 10.9 10*3/uL — ABNORMAL HIGH (ref 4.0–10.5)
nRBC: 0 % (ref 0.0–0.2)

## 2021-03-03 LAB — COMPREHENSIVE METABOLIC PANEL
ALT: 9 U/L (ref 0–44)
AST: 10 U/L — ABNORMAL LOW (ref 15–41)
Albumin: 1.4 g/dL — ABNORMAL LOW (ref 3.5–5.0)
Alkaline Phosphatase: 75 U/L (ref 38–126)
Anion gap: 5 (ref 5–15)
BUN: 13 mg/dL (ref 8–23)
CO2: 30 mmol/L (ref 22–32)
Calcium: 7.1 mg/dL — ABNORMAL LOW (ref 8.9–10.3)
Chloride: 100 mmol/L (ref 98–111)
Creatinine, Ser: 3.46 mg/dL — ABNORMAL HIGH (ref 0.44–1.00)
GFR, Estimated: 13 mL/min — ABNORMAL LOW (ref >60)
Glucose, Bld: 76 mg/dL (ref 70–99)
Potassium: 3.9 mmol/L (ref 3.5–5.1)
Sodium: 135 mmol/L (ref 135–145)
Total Bilirubin: 0.7 mg/dL (ref 0.3–1.2)
Total Protein: 3.3 g/dL — ABNORMAL LOW (ref 6.5–8.1)

## 2021-03-03 LAB — MAGNESIUM: Magnesium: 1.9 mg/dL (ref 1.7–2.4)

## 2021-03-03 LAB — BRAIN NATRIURETIC PEPTIDE: B Natriuretic Peptide: 2163.6 pg/mL — ABNORMAL HIGH (ref 0.0–100.0)

## 2021-03-03 LAB — HEPATITIS B SURFACE ANTIBODY, QUANTITATIVE: Hep B S AB Quant (Post): 33.3 m[IU]/mL (ref >9.9)

## 2021-03-03 MED ORDER — FIDAXOMICIN 200 MG PO TABS
200.0000 mg | ORAL_TABLET | Freq: Two times a day (BID) | ORAL | 0 refills | Status: DC
Start: 2021-03-03 — End: 2021-03-16
  Filled 2021-03-03: qty 20, 10d supply, fill #0

## 2021-03-03 MED ORDER — ONDANSETRON HCL 4 MG PO TABS
4.0000 mg | ORAL_TABLET | Freq: Three times a day (TID) | ORAL | 0 refills | Status: AC | PRN
  Filled 2021-03-03: qty 20, 7d supply, fill #0

## 2021-03-03 NOTE — TOC Transition Note (Signed)
Transition of Care [facility]) - CM/SW Discharge Note   Patient Details  Name: [name] MRN: [id number] Date of Birth: Jul 11, 1945  [facility] of [facility] CM/SW Contact:  Sharin Mons, RN Phone Number: 03/03/2021, 11:54 AM   Clinical Narrative:    Patient will DC to: home  Anticipated DC date:03/03/2021 Family notified: yes Transport by: Corey Harold  Per MD patient ready for DC today. RN, patient, patient's daughter, and [place] notified of DC. Pt was active with [place] prior to to admission and agreeable to continue services with them.  Pt without DME needs.  Post hospital f/u noted on AVS.  Rx meds will be picked up from pharmacy by nurse and given to pt's daughter prior to d/c.  Pt will need non emergent ambulance transportation transportation to home . PTAR/ ambulance transport requested for patient.   Leba Tibbitts (Daughter)       [phone]       RNCM will sign off for now as intervention is no longer needed. Please consult [name] again if new needs arise.    Final next level of care: [place] Barriers to Discharge: No Barriers Identified   Patient Goals and CMS Choice     Choice offered to / list presented to : Patient, Adult Children  Discharge Placement                       Discharge Plan and Services                          HH Arranged: RN, PT, NA HH Agency: [place] Date [facility] Agency Contacted: 03/03/21 Time [place]: [place] Representative spoke with at [place]: [place] ([place]) Interventions     Readmission Risk Interventions No flowsheet data found.

## 2021-03-03 NOTE — Progress Notes (Signed)
Occupational Therapy Treatment Patient Details Name: [name] MRN: [id number] DOB: July 15, 1945 Today's Date: 03/03/2021   History of present illness The pt is a 75 y.o. female  presenting c/o weakness, lethargy. She has had diahrehea and bloody stools for abolut a week now. She is on HD. PMH includes: recent admission for LUE hematoma with concern for compartment syndrome, arthritis, cardiomyopathy, CHF, ESRD, GERD, HLD, HTN, MI, a fib, L THA and L TKA.   OT comments  Robert is doing well with plans to d/c home today. Pt required increased assist today with bed mobility and scooting. Increased time spent on bed mobility due to pericare for incontinent bowel. Once sitting EOB pt completed alternating BUE exercises and leg kicks (see below). She had poor postural control and required frequent cues to stay at midline. Pt continues to benefit from OT acutely to address the deficits below.    Recommendations for follow up therapy are one component of a multi-disciplinary discharge planning process, led by the attending physician.  Recommendations may be updated based on patient status, additional functional criteria and insurance authorization.    Follow Up Recommendations  Home health OT;Supervision/Assistance - 24 hour    Equipment Recommendations  None recommended by OT    Recommendations for Other Services      Precautions / Restrictions Precautions Precautions: Fall Precaution Comments: enteric Restrictions Weight Bearing Restrictions: No       Mobility Bed Mobility Overal bed mobility: Needs Assistance Bed Mobility: Rolling;Sidelying to Sit;Sit to Supine Rolling: Min assist Sidelying to sit: Mod assist   Sit to supine: Max assist   General bed mobility comments: incrased assist needed this session    Transfers Overall transfer level: Needs assistance   Transfers: Lateral/Scoot Transfers          Lateral/Scoot Transfers: Max assist General transfer comment: max  A to scoot this session for verbal cues and physical A    Balance Overall balance assessment: Needs assistance Sitting-balance support: Feet supported;Single extremity supported Sitting balance-Leahy Scale: Fair Sitting balance - Comments: pt requried frequent verbal cues to maintain midline posture                                   ADL either performed or assessed with clinical judgement   ADL Overall ADL's : Needs assistance/impaired                             Toileting- Clothing Manipulation and Hygiene: Total assistance;Bed level Toileting - Clothing Manipulation Details (indicate cue type and reason): total A at baseline     Functional mobility during ADLs: Maximal assistance General ADL Comments: daughter present this session confirming pts baseline; pt was total A for pericare at this time and max for sup>sit     Vision   Vision Assessment?: No apparent visual deficits   Perception     Praxis      Cognition Arousal/Alertness: Awake/alert Behavior During Therapy: WFL for tasks assessed/performed Overall Cognitive Status: Within Functional Limits for tasks assessed                                          Exercises Exercises: General Upper Extremity;Other exercises General Exercises - Upper Extremity Shoulder Flexion: AROM;10 reps;Both;Seated Shoulder Horizontal ABduction: AROM;Both;10 reps;Seated Other  Exercises Other Exercises: Leg kicks while sitting 10 Reps ROM each leg   Shoulder Instructions       General Comments Pt with c/o fatigue this session    Pertinent Vitals/ Pain       Pain Assessment: Faces Faces Pain Scale: Hurts a little bit Pain Location: generalized wtih movement Pain Descriptors / Indicators: Aching;Grimacing;Guarding Pain Intervention(s): Limited activity within patient's tolerance;Monitored during session;Repositioned  Home Living                                           Prior Functioning/Environment              Frequency  Min 2X/week        Progress Toward Goals  OT Goals(current goals can now be found in the care plan section)  Progress towards OT goals: Progressing toward goals  Acute Rehab OT Goals Patient Stated Goal: to feel better OT Goal Formulation: With patient Potential to Achieve Goals: Fair ADL Goals Pt Will Perform Grooming: with modified independence;sitting Pt Will Perform Upper Body Bathing: with supervision;sitting Pt Will Perform Lower Body Dressing: with mod assist;sitting/lateral leans Pt/caregiver will Perform Home Exercise Program: Increased strength;Both right and left upper extremity;With written HEP provided;With Supervision  Plan Discharge plan remains appropriate    Co-evaluation                 AM-PAC OT "6 Clicks" Daily Activity     Outcome Measure   Help from another person eating meals?: A Little Help from another person taking care of personal grooming?: A Little Help from another person toileting, which includes using toliet, bedpan, or urinal?: Total Help from another person bathing (including washing, rinsing, drying)?: A Lot Help from another person to put on and taking off regular upper body clothing?: A Little Help from another person to put on and taking off regular lower body clothing?: Total 6 Click Score: 13    End of Session    OT Visit Diagnosis: Unsteadiness on feet (R26.81);Other abnormalities of gait and mobility (R26.89);Muscle weakness (generalized) (M62.81);Pain   Activity Tolerance Patient tolerated treatment well;Patient limited by fatigue   Patient Left in bed;with call bell/phone within reach;with bed alarm set   Nurse Communication Mobility status;Precautions        Time: 0254-2706 OT Time Calculation (min): 29 min  Charges: OT General Charges $OT Visit: 1 Visit OT Treatments $Self Care/Home Management : 8-22 mins $Therapeutic Exercise: 23-37  mins    Mieko Kneebone A Ragan Reale 03/03/2021, 1:22 PM

## 2021-03-03 NOTE — Progress Notes (Addendum)
[facility] Progress Note   Subjective:    Patient seen and examined at bedside. Didn't tolerate UFG 2-2.5L during yesterday's HD d/t nausea and hypotension. Removed around 879ml. Feeling better currently. Denies SOB, CP, n/v, and ABD. Patient scheduled for discharge today. Will resume HD at outpatient HD center.  Objective Vitals:   03/02/21 1310 03/02/21 1341 03/02/21 2110 03/03/21 1003  BP: (!) 180/64 (!) 144/49 (!) 153/49 (!) 118/47  Pulse: 70 72 70   Resp: 14  16   Temp: (!) 97 F (36.1 C) 97.8 F (36.6 C) 99.1 F (37.3 C) 98.5 F (36.9 C)  TempSrc: Oral Oral Oral Oral  SpO2:  95% 94% 93%  Weight: 65.2 kg     Height:       Physical Exam General:chronically ill appearing female in NAD Heart:RRR, +8/6 systolic murmur Lungs:CTAB, breath sounds decreased in bases, nml WOB on RA Abdomen:soft, non-tender, active bowel sounds Extremities:1+ LE edema Dialysis Access: TDC, LU AVF +b/t    Filed Weights   03/02/21 0901 03/02/21 0920 03/02/21 1310  Weight: 69.1 kg 69.1 kg 65.2 kg    Intake/Output Summary (Last 24 hours) at 03/03/2021 1226 Last data filed at 03/02/2021 1500 Gross per 24 hour  Intake 760.72 ml  Output 854 ml  Net -93.28 ml    Additional Objective Labs: Basic Metabolic Panel: Recent Labs  Lab 02/27/21 1431 02/28/21 0958 03/01/21 0136 03/02/21 0247 03/03/21 0117  NA 135   < > 137 134* 135  K 4.3   < > 4.6 4.5 3.9  CL 101   < > 103 99 100  CO2 29   < > 25 25 30   GLUCOSE 122*   < > 89 128* 76  BUN 13   < > 17 22 13   CREATININE 3.51*   < > 3.74* 4.87* 3.46*  CALCIUM 7.6*   < > 7.4* 7.7* 7.1*  PHOS 2.7  --   --   --   --    < > = values in this interval not displayed.   Liver Function Tests: Recent Labs  Lab 03/01/21 0136 03/02/21 0247 03/03/21 0117  AST 16 12* 10*  ALT 10 12 9   ALKPHOS 70 104 75  BILITOT 0.8 0.8 0.7  PROT 3.3* 3.7* 3.3*  ALBUMIN 1.2* 1.4* 1.4*   Recent Labs  Lab 02/25/21 2253  LIPASE 17   CBC: Recent  Labs  Lab 02/27/21 0121 02/28/21 0958 03/01/21 0136 03/02/21 0247 03/03/21 0117  WBC 13.2* 16.7* 13.0* 16.6* 10.9*  NEUTROABS  --  14.7* 11.1* 15.3* 9.0*  HGB 8.1* 8.9* 8.1* 9.2* 7.5*  HCT 25.9* 28.8* 26.6* 29.9* 24.1*  MCV 96.6 96.3 100.8* 97.1 95.3  PLT 255 224 210 254 175   Blood Culture    Component Value Date/Time   SDES BLOOD LEFT FOREARM 12/31/2020 1850   SPECREQUEST  12/31/2020 1850    BOTTLES DRAWN AEROBIC AND ANAEROBIC Blood Culture results may not be optimal due to an inadequate volume of blood received in culture bottles   CULT  12/31/2020 1850    NO GROWTH 5 DAYS Performed at [facility], [place] [address], [place], [place] [address]    REPTSTATUS 01/05/2021 FINAL 12/31/2020 1850    Cardiac Enzymes: No results for input(s): CKTOTAL, CKMB, CKMBINDEX, TROPONINI in the last 168 hours. CBG: No results for input(s): GLUCAP in the last 168 hours. Iron Studies: No results for input(s): IRON, TIBC, TRANSFERRIN, FERRITIN in the last 72 hours. Lab Results  Component Value Date   INR 2.7 (H) 02/28/2021   INR 2.9 (H) 02/27/2021   INR 3.2 (H) 02/26/2021   Studies/Results: No results found.  Medications:  ferric gluconate (FERRLECIT) IVPB Stopped (03/02/21 1304)    amiodarone  200 mg Oral Daily   apixaban  5 mg Oral BID   feeding supplement  1 Container Oral TID BM   fidaxomicin  200 mg Oral BID   gabapentin  300 mg Oral QHS   hydrALAZINE  10 mg Intravenous Once   multivitamin  1 tablet Oral QHS    Dialysis Orders: [facility] TTS  4h  400/500  68.5kg   2/2 bath P2  Hep none    AVF / TDC   - venofer 100mg  x 5, has had 2 doses   - mircera 150 q2, last 02/18/21, due 9/15    Assessment/Plan: Acute LGIB - per GI/ pmd A/C diarrhea - recurrent Cdiff, + toxin/ antigen here. ID consulted, on dificid, avoiding other antibiotics and PPI if possible. Improving leukocytosis. ESRD: TTS HD. Only removed 860ml during yesterday's HD d/t episodes hypotension and nausea-IV  Albumin given. Continue UF as tolerated and Albumin PRN if indicated. Hypertension/volume: Blood pressure improved after HD-dropped during HD yesterday. Not on antihypertensives at home.  Continues to have LE edema, although improving. Albumin very low.  Continue to UF and lower dry as tolerated.  Standing weights if possible.  Anemia of ESRD: Hgbs up and down-now 7.5. cont IV iron loading here. Resume ESA in outpatient and Fe load if needed.    Metabolic bone disease: Corrected Ca and phos in goal. Not on binders or VDRA.  Hx of ESBL E.coli UTI - and other pathogens.  Recent L arm compartment syndrome - sp AVF revision / repair.  Currently refusing to use due to ongoing swelling around AVF. Chronic combined CHF Paroxysmal A-Fib - takes Eliquis/amiodarone. Severe aortic stenosis  Disposition: Okay for discharge from renal standpoint.  Tobie Poet, NP [facility] 03/03/2021,12:26 PM  LOS: 5 days    Seen and examined independently.  Agree with note and exam as documented above by physician extender and as noted here.  Feels ok.  Diarrhea better. No n/v. And no shortness of breath.   General adult female in bed in no acute distress HEENT normocephalic atraumatic extraocular movements intact sclera anicteric Neck supple trachea midline Lungs clear to auscultation bilaterally normal work of breathing at rest; room air Heart S1S2 no rub Abdomen soft nontender nondistended Extremities 1+ edema  Psych normal mood and affect Access: LUE AVF b/t and RIJ tunn catheter in place  Acute on chronic diarrhea - supportive care per primary team    ESRD on HD TTS schedule Use AVF if pt willing - cleared for use per vascular note 12/11/20   Anemia ESRD - on iron and ESA  Disposition per primary team.  She states home today  Claudia Desanctis, MD 03/03/2021  2:35 PM

## 2021-03-03 NOTE — Discharge Summary (Signed)
[name] RFX:[id number] DOB: [birth year] DOA: 02/25/2021  PCP: Lajean Manes, MD  Admit date: 02/25/2021  Discharge date: 03/03/2021  Admitted From: Home  Disposition:  Home   Recommendations for Outpatient Follow-up:   Follow up with PCP in 1-2 weeks  PCP Please obtain BMP/CBC, 2 view CXR in 1week,  (see Discharge instructions)   PCP Please follow up on the following pending results:    Home Health: PT,RN   Equipment/Devices: as below  Consultations: ID, GI, Renal Discharge Condition: Stable    CODE STATUS: Full    Diet Recommendation: Renal diet with 1.5 L fluid restriction per day.  CC -severe diarrhea    Brief history of present illness from the day of admission and additional interim summary    75 year old female with past medical history of end-stage renal disease (HD tues, thurs, sat), severe aortic stenosis, chronic diastolic congestive heart failure (Echo 09/2020 EF 55-60% with G2DD), C. difficile colonization, paroxysmal atrial fibrillation (on eliquis), frequent urinary tract infections on suppression therapy with Keflex with recent ESBL Proteus UTI  (12/2020), anemia of chronic disease, gastroesophageal reflux disease who presents to [facility] emergency department due to progressively worsening diarrhea and weakness, in the ER work-up suggestive of recurrent C. Difficile infection, acute on chronic anemia.  GI and ID were consulted and she was admitted to the hospital.                                                                 Hospital Course   Acute on Chr. C Diff infection - she has had multiple previous episodes of C. difficile infection, GI and ID on board, currently she is on Dificid, and diarrhea has almost completely resolved.  10-day supply or deficit upon discharge which  pharmacy has arranged for free.  Thereafter follow-up with PCP and GI.   2.  Acute on chronic anemia.  Some question of rectal bleeding.  Likely due to excessive wiping from diarrhea, H&H currently appears stable, will try to monitor over heparin drip >> Eliquis if H&H stable.  Iron low but ++ Ferritin on An.Panel, PO iron while here, PCP to do outpatient monitoring of anemia with age appropriate outpatient work-up.  Was seen by GI here.  No procedures.   3.  History of paroxysmal atrial fibrillation [place] vas 2 score of greater than 3.  Continue combination of amiodarone, heparin instead of Eliquis for now.   4.  ESRD. On TTS schedule.  Nephrology on board.  Will commence dialysis per home regimen upon discharge.   5.  History of ESBL UTI in the past.  Monitor.  No acute issues.   6.  History of severe aortic stenosis.  No acute issues will monitor will avoid large drops in preload or hypotension.   7.  Chronic diastolic CHF.  EF 55% on echocardiogram in April 2020.  Currently compensated.   8.  Severe PCM.  Received protein supplements here..   9.  Generalized weakness.  Due to ongoing C. difficile infection and diarrhea.  PT OT she qualified for SNF but patient and family refused placement.  We will get PT OT at home.   10. Hypomagnesemia - replaced.   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  On your next visit with your primary care physician please Get Medicines reviewed and adjusted.  Please request your Prim.MD to go over all Hospital Tests and Procedure/Radiological results at the follow up, please get all Hospital records sent to your Prim MD by signing hospital release before you go home.  If you experience worsening of your admission symptoms, develop shortness of breath, life threatening emergency, suicidal or homicidal thoughts you must seek medical attention immediately by calling 911 or calling your MD immediately  if symptoms less severe.  You Must read complete instructions/literature along with all the possible adverse reactions/side effects for all the Medicines you take and that have been prescribed to you. Take any new Medicines after you have completely understood and accpet all the possible adverse reactions/side effects.   Increase activity slowly   Complete by: As directed    No wound care   Complete by: As directed        Discharge Medications   Allergies as of 03/03/2021       Reactions   Astemizole Nausea And Vomiting   Fluorouracil Rash   Vicodin [hydrocodone-acetaminophen] Nausea And Vomiting   Chlorhexidine Rash   Sunburn    rash   Percocet [oxycodone-acetaminophen] Nausea And Vomiting        Medication List     STOP taking these medications    colestipol 1 g tablet Commonly known as: COLESTID   polycarbophil 625 MG tablet Commonly known as: FIBERCON   VANCOMYCIN HCL PO       TAKE these medications    amiodarone 200 MG tablet Commonly known as: PACERONE Take 1 tablet (200 mg total) by mouth daily.   apixaban 5 MG Tabs  tablet Commonly known as: ELIQUIS Take 1 tablet (5 mg total) by mouth 2 (two) times daily.   fidaxomicin 200 MG Tabs tablet Commonly known as: DIFICID Take 1 tablet (200 mg total) by mouth 2 (two) times daily.   gabapentin 600 MG tablet Commonly known as: NEURONTIN Take 0.5 tablets (300 mg total) by mouth at  bedtime.   heparin 1000 unit/mL Soln injection Heparin Sodium (Porcine) 1,000 Units/mL Catheter Lock Arterial   multivitamin Tabs tablet Take 1 tablet by mouth at bedtime.   ondansetron 4 MG tablet Commonly known as: Zofran Take 1 tablet (4 mg total) by mouth every 8 (eight) hours as needed for nausea or vomiting.   Potassium Chloride ER 20 MEQ Tbcr Take 1 tablet by mouth daily.   saccharomyces boulardii 250 MG capsule Commonly known as: FLORASTOR Take 1 capsule (250 mg total) by mouth 2 (two) times daily.         Follow-up Information     Stoneking, Hal, MD. Schedule an appointment as soon as possible for a visit in 1 week(s).   Specialty: Internal Medicine Contact information: [address] [place] [address] [phone]         Jettie Booze, MD .   Specialties: Cardiology, Radiology, Interventional Cardiology Contact information: [address] [place] [address] [phone]         Ronnette Juniper, MD. Schedule an appointment as soon as possible for a visit in 1 week(s).   Specialty: Gastroenterology Contact information: [place] [address] [phone]                 Major procedures and Radiology Reports - PLEASE review detailed and final reports thoroughly  -       DG Chest Portable 1 View  Result Date: 02/25/2021 CLINICAL DATA:  Fatigue, weakness EXAM: PORTABLE CHEST 1 VIEW COMPARISON:  12/31/2020 FINDINGS: Right dialysis catheter remains in place, unchanged. Cardiomegaly with vascular congestion. Perihilar and lower lobe airspace disease, likely edema.  Suspect layering effusions. IMPRESSION: Cardiomegaly with mild CHF.  Suspect layering effusions. Electronically Signed   By: Rolm Baptise M.D.   On: 02/25/2021 19:34       Today   Subjective    [name] today has no headache,no chest abdominal pain,no new weakness tingling or numbness, feels much better wants to go home today.     Objective   Blood pressure (!) 153/49, pulse 70, temperature 99.1 F (37.3 C), temperature source Oral, resp. rate 16, height 5\' 3"  (1.6 m), weight 65.2 kg, last menstrual period 01/19/1992, SpO2 94 %.   Intake/Output Summary (Last 24 hours) at 03/03/2021 0843 Last data filed at 03/02/2021 1500 Gross per 24 hour  Intake 760.72 ml  Output 854 ml  Net -93.28 ml    Exam  Awake Alert, No new F.N deficits, Normal affect [place].AT,PERRAL Supple Neck,No JVD, No cervical lymphadenopathy appriciated.  Symmetrical Chest wall movement, Good air movement bilaterally, CTAB RRR,No Gallops,Rubs or new Murmurs, No Parasternal Heave +ve B.Sounds, Abd Soft, Non tender, No organomegaly appriciated, No rebound -guarding or rigidity. No Cyanosis, Clubbing or edema, No new Rash or bruise   Data Review   CBC w Diff:  Lab Results  Component Value Date   WBC 10.9 (H) 03/03/2021   HGB 7.5 (L) 03/03/2021   HGB 13.6 09/16/2013   HCT 24.1 (L) 03/03/2021   HCT 23.6 (L) 04/08/2015   PLT 175 03/03/2021   LYMPHOPCT 6 03/03/2021   MONOPCT 9 03/03/2021   EOSPCT 1 03/03/2021   BASOPCT 0 03/03/2021    CMP:  Lab Results  Component Value Date   NA 135 03/03/2021   K 3.9 03/03/2021   CL 100 03/03/2021   CO2 30 03/03/2021   BUN 13 03/03/2021   CREATININE 3.46 (H) 03/03/2021  PROT 3.3 (L) 03/03/2021   PROT 6.2 06/01/2018   ALBUMIN 1.4 (L) 03/03/2021   ALBUMIN 4.1 06/01/2018   BILITOT 0.7 03/03/2021   BILITOT 0.2 06/01/2018   ALKPHOS 75 03/03/2021   AST 10 (L) 03/03/2021   ALT 9 03/03/2021  .   Total Time in preparing paper work, data evaluation and todays  exam - 70 minutes  Lala Lund M.D on 03/03/2021 at 8:43 AM  [facility]

## 2021-03-03 NOTE — Progress Notes (Signed)
PT Cancellation Note  Patient Details Name: [name] MRN: [id number] DOB: 08-31-1945   Cancelled Treatment:    Reason Eval/Treat Not Completed: Patient declined, no reason specified;Other (comment) (Pt. is D/Cing home today; states she worked with OT earlier and does not feel that there is anything else she needs to work on prior to going home.  Declines even therex in bed.  NSG aware.)   [name] 03/03/2021, 2:08 PM  Rapheal Masso A. Lichelle Viets, PT, [place] Office: [phone]

## 2021-03-03 NOTE — Progress Notes (Signed)
Patient was discharged to home. AVS reviewed with her daughter over the phone, [facility] provided transportation.

## 2021-03-04 NOTE — TOC Transition Note (Signed)
Transition of care contact from inpatient facility  Date of discharge: 03/03/21 Date of contact: 03/04/21 Method: Attempted Spoke to: No Answer  Patient contacted to discuss transition of care from recent inpatient hospitalization but she did not pick up the phone. A voicemail was left to call the [facility] for any questions.  Patient will follow up with her outpatient HD unit on: Saturday 03/06/21.  Tobie Poet, NP

## 2021-03-11 ENCOUNTER — Encounter (HOSPITAL_COMMUNITY): Payer: Self-pay | Admitting: Internal Medicine

## 2021-03-11 ENCOUNTER — Inpatient Hospital Stay (HOSPITAL_COMMUNITY)
Admission: EM | Admit: 2021-03-11 | Discharge: 2021-03-16 | DRG: 871 | Disposition: A | Discharge status: Hospice | Payer: Medicare Other | Attending: Student in an Organized Health Care Education/Training Program | Admitting: Student in an Organized Health Care Education/Training Program

## 2021-03-11 DIAGNOSIS — L89312 Pressure ulcer of right buttock, stage 2: Secondary | ICD-10-CM | POA: Diagnosis present

## 2021-03-11 DIAGNOSIS — E785 Hyperlipidemia, unspecified: Secondary | ICD-10-CM | POA: Diagnosis present

## 2021-03-11 DIAGNOSIS — Z8052 Family history of malignant neoplasm of bladder: Secondary | ICD-10-CM

## 2021-03-11 DIAGNOSIS — I5042 Chronic combined systolic (congestive) and diastolic (congestive) heart failure: Secondary | ICD-10-CM | POA: Diagnosis present

## 2021-03-11 DIAGNOSIS — Z96642 Presence of left artificial hip joint: Secondary | ICD-10-CM | POA: Diagnosis present

## 2021-03-11 DIAGNOSIS — L89156 Pressure-induced deep tissue damage of sacral region: Secondary | ICD-10-CM | POA: Diagnosis present

## 2021-03-11 DIAGNOSIS — N2581 Secondary hyperparathyroidism of renal origin: Secondary | ICD-10-CM | POA: Diagnosis present

## 2021-03-11 DIAGNOSIS — Z515 Encounter for palliative care: Secondary | ICD-10-CM

## 2021-03-11 DIAGNOSIS — Z20822 Contact with and (suspected) exposure to covid-19: Secondary | ICD-10-CM | POA: Diagnosis present

## 2021-03-11 DIAGNOSIS — Z885 Allergy status to narcotic agent status: Secondary | ICD-10-CM

## 2021-03-11 DIAGNOSIS — G4700 Insomnia, unspecified: Secondary | ICD-10-CM | POA: Diagnosis present

## 2021-03-11 DIAGNOSIS — K219 Gastro-esophageal reflux disease without esophagitis: Secondary | ICD-10-CM | POA: Diagnosis present

## 2021-03-11 DIAGNOSIS — Z96652 Presence of left artificial knee joint: Secondary | ICD-10-CM | POA: Diagnosis present

## 2021-03-11 DIAGNOSIS — Z992 Dependence on renal dialysis: Secondary | ICD-10-CM

## 2021-03-11 DIAGNOSIS — N186 End stage renal disease: Secondary | ICD-10-CM | POA: Diagnosis not present

## 2021-03-11 DIAGNOSIS — I429 Cardiomyopathy, unspecified: Secondary | ICD-10-CM | POA: Diagnosis present

## 2021-03-11 DIAGNOSIS — Z8249 Family history of ischemic heart disease and other diseases of the circulatory system: Secondary | ICD-10-CM

## 2021-03-11 DIAGNOSIS — L8990 Pressure ulcer of unspecified site, unspecified stage: Secondary | ICD-10-CM | POA: Insufficient documentation

## 2021-03-11 DIAGNOSIS — I5032 Chronic diastolic (congestive) heart failure: Secondary | ICD-10-CM | POA: Diagnosis present

## 2021-03-11 DIAGNOSIS — A0472 Enterocolitis due to Clostridium difficile, not specified as recurrent: Secondary | ICD-10-CM

## 2021-03-11 DIAGNOSIS — I350 Nonrheumatic aortic (valve) stenosis: Secondary | ICD-10-CM

## 2021-03-11 DIAGNOSIS — A4189 Other specified sepsis: Principal | ICD-10-CM | POA: Diagnosis present

## 2021-03-11 DIAGNOSIS — R531 Weakness: Secondary | ICD-10-CM | POA: Diagnosis not present

## 2021-03-11 DIAGNOSIS — Z8619 Personal history of other infectious and parasitic diseases: Secondary | ICD-10-CM

## 2021-03-11 DIAGNOSIS — I480 Paroxysmal atrial fibrillation: Secondary | ICD-10-CM | POA: Diagnosis present

## 2021-03-11 DIAGNOSIS — N189 Chronic kidney disease, unspecified: Secondary | ICD-10-CM | POA: Diagnosis present

## 2021-03-11 DIAGNOSIS — I10 Essential (primary) hypertension: Secondary | ICD-10-CM | POA: Diagnosis present

## 2021-03-11 DIAGNOSIS — R627 Adult failure to thrive: Secondary | ICD-10-CM | POA: Diagnosis present

## 2021-03-11 DIAGNOSIS — E162 Hypoglycemia, unspecified: Secondary | ICD-10-CM | POA: Diagnosis not present

## 2021-03-11 DIAGNOSIS — R188 Other ascites: Secondary | ICD-10-CM | POA: Diagnosis not present

## 2021-03-11 DIAGNOSIS — N390 Urinary tract infection, site not specified: Secondary | ICD-10-CM | POA: Diagnosis present

## 2021-03-11 DIAGNOSIS — I132 Hypertensive heart and chronic kidney disease with heart failure and with stage 5 chronic kidney disease, or end stage renal disease: Secondary | ICD-10-CM | POA: Diagnosis present

## 2021-03-11 DIAGNOSIS — A0471 Enterocolitis due to Clostridium difficile, recurrent: Secondary | ICD-10-CM

## 2021-03-11 DIAGNOSIS — Z993 Dependence on wheelchair: Secondary | ICD-10-CM

## 2021-03-11 DIAGNOSIS — Z66 Do not resuscitate: Secondary | ICD-10-CM | POA: Diagnosis not present

## 2021-03-11 DIAGNOSIS — Z79899 Other long term (current) drug therapy: Secondary | ICD-10-CM

## 2021-03-11 DIAGNOSIS — Z833 Family history of diabetes mellitus: Secondary | ICD-10-CM

## 2021-03-11 DIAGNOSIS — D72829 Elevated white blood cell count, unspecified: Secondary | ICD-10-CM

## 2021-03-11 DIAGNOSIS — G929 Unspecified toxic encephalopathy: Secondary | ICD-10-CM | POA: Diagnosis not present

## 2021-03-11 DIAGNOSIS — A419 Sepsis, unspecified organism: Secondary | ICD-10-CM

## 2021-03-11 DIAGNOSIS — D631 Anemia in chronic kidney disease: Secondary | ICD-10-CM | POA: Diagnosis present

## 2021-03-11 DIAGNOSIS — L89322 Pressure ulcer of left buttock, stage 2: Secondary | ICD-10-CM | POA: Diagnosis present

## 2021-03-11 DIAGNOSIS — Z7189 Other specified counseling: Secondary | ICD-10-CM

## 2021-03-11 DIAGNOSIS — Z7901 Long term (current) use of anticoagulants: Secondary | ICD-10-CM

## 2021-03-11 DIAGNOSIS — F419 Anxiety disorder, unspecified: Secondary | ICD-10-CM | POA: Diagnosis present

## 2021-03-11 DIAGNOSIS — G9340 Encephalopathy, unspecified: Secondary | ICD-10-CM

## 2021-03-11 DIAGNOSIS — J90 Pleural effusion, not elsewhere classified: Secondary | ICD-10-CM

## 2021-03-11 DIAGNOSIS — I953 Hypotension of hemodialysis: Secondary | ICD-10-CM | POA: Diagnosis not present

## 2021-03-11 DIAGNOSIS — Z8614 Personal history of Methicillin resistant Staphylococcus aureus infection: Secondary | ICD-10-CM

## 2021-03-11 HISTORY — DX: Melena: K92.1

## 2021-03-11 HISTORY — DX: Other acute postprocedural pain: G89.18

## 2021-03-11 HISTORY — DX: Dehydration: E86.0

## 2021-03-11 HISTORY — DX: Hemorrhage of anus and rectum: K62.5

## 2021-03-11 HISTORY — DX: Hypoxemia: R09.02

## 2021-03-11 HISTORY — DX: Unspecified atrial fibrillation: I48.91

## 2021-03-11 HISTORY — DX: Acute gastroenteropathy due to Norwalk agent: A08.11

## 2021-03-11 LAB — CBC WITH DIFFERENTIAL/PLATELET
Abs Immature Granulocytes: 0 10*3/uL (ref 0.00–0.07)
Basophils Absolute: 0 10*3/uL (ref 0.0–0.1)
Basophils Relative: 0 %
Eosinophils Absolute: 0 10*3/uL (ref 0.0–0.5)
Eosinophils Relative: 0 %
HCT: 33.3 % — ABNORMAL LOW (ref 36.0–46.0)
Hemoglobin: 10.2 g/dL — ABNORMAL LOW (ref 12.0–15.0)
Lymphocytes Relative: 1 %
Lymphs Abs: 0.2 10*3/uL — ABNORMAL LOW (ref 0.7–4.0)
MCH: 30.3 pg (ref 26.0–34.0)
MCHC: 30.6 g/dL (ref 30.0–36.0)
MCV: 98.8 fL (ref 80.0–100.0)
Monocytes Absolute: 0 10*3/uL — ABNORMAL LOW (ref 0.1–1.0)
Monocytes Relative: 0 %
Neutro Abs: 24.3 10*3/uL — ABNORMAL HIGH (ref 1.7–7.7)
Neutrophils Relative %: 99 %
Platelets: 174 10*3/uL (ref 150–400)
RBC: 3.37 MIL/uL — ABNORMAL LOW (ref 3.87–5.11)
RDW: 23.8 % — ABNORMAL HIGH (ref 11.5–15.5)
WBC: 24.5 10*3/uL — ABNORMAL HIGH (ref 4.0–10.5)
nRBC: 0 % (ref 0.0–0.2)
nRBC: 0 /100 WBC

## 2021-03-11 LAB — COMPREHENSIVE METABOLIC PANEL
ALT: 17 U/L (ref 0–44)
AST: 39 U/L (ref 15–41)
Albumin: 1.6 g/dL — ABNORMAL LOW (ref 3.5–5.0)
Alkaline Phosphatase: 119 U/L (ref 38–126)
Anion gap: 7 (ref 5–15)
BUN: 15 mg/dL (ref 8–23)
CO2: 28 mmol/L (ref 22–32)
Calcium: 7.8 mg/dL — ABNORMAL LOW (ref 8.9–10.3)
Chloride: 101 mmol/L (ref 98–111)
Creatinine, Ser: 2.39 mg/dL — ABNORMAL HIGH (ref 0.44–1.00)
GFR, Estimated: 21 mL/min — ABNORMAL LOW (ref >60)
Glucose, Bld: 83 mg/dL (ref 70–99)
Potassium: 5.3 mmol/L — ABNORMAL HIGH (ref 3.5–5.1)
Sodium: 136 mmol/L (ref 135–145)
Total Bilirubin: 1.1 mg/dL (ref 0.3–1.2)
Total Protein: 3.9 g/dL — ABNORMAL LOW (ref 6.5–8.1)

## 2021-03-11 LAB — TSH: TSH: 2.69 u[IU]/mL (ref 0.350–4.500)

## 2021-03-11 LAB — TROPONIN I (HIGH SENSITIVITY)
Troponin I (High Sensitivity): 56 ng/L — ABNORMAL HIGH (ref <18)
Troponin I (High Sensitivity): 56 ng/L — ABNORMAL HIGH (ref <18)

## 2021-03-11 LAB — LACTIC ACID, PLASMA: Lactic Acid, Venous: 1.9 mmol/L (ref 0.5–1.9)

## 2021-03-11 MED ORDER — LACTATED RINGERS IV BOLUS: 1000.0000 mL | Freq: Once | INTRAVENOUS | Status: DC

## 2021-03-11 MED ORDER — LACTATED RINGERS IV BOLUS
500.0000 mL | Freq: Once | INTRAVENOUS | Status: AC
  Administered 2021-03-11: 500 mL via INTRAVENOUS

## 2021-03-11 NOTE — Assessment & Plan Note (Signed)
Stable. Does not appear volume overloaded. Just had HD today.

## 2021-03-11 NOTE — Assessment & Plan Note (Signed)
Admit to medical bed. Appears that pt has failed dificid. Is currently taking the last dose of dificid(10 day course) but her intensity of loose stools has increased over the past 2-3 days.  Also with her increasing leukocytosis and lethargy, makes me concerned about recurrent c. Diff. Would recommend getting ID and GI(has been [facility] in the past) to consider fecal microbiota transplant(FMT). Will continue Dificid for now.

## 2021-03-11 NOTE — ED Notes (Signed)
In and out cath attempted for urine per MD. Unsuccessful. MD notified.

## 2021-03-11 NOTE — Assessment & Plan Note (Signed)
Stable

## 2021-03-11 NOTE — H&P (Signed)
History and Physical    [name] PIR:[id number] DOB: 30-Aug-1945 DOA: 03/11/2021  PCP: Lajean Manes, MD   Patient coming from: Home  I have personally briefly reviewed patient's old medical records in [place]  CC: weakness, increasing diarrhea HPI: 75 year old female with a history of end-stage renal disease on hemodialysis on Tuesday Thursday Saturday, recurrent C. difficile diarrhea, hypertension, chronic diastolic heart failure, severe aortic stenosis, anemia of chronic kidney disease who presents to the ER today with 2 to 3 days of worsening diarrhea.  Patient had hemodialysis today.  She came home and felt very weak.  Daughter is at the bedside.  Patient lives with her daughter.  Patient recently discharged in the middle of September 2022 after a long hospital stay for recurrent C. difficile diarrhea.  She was discharged on 10 days of p.o. Dificid.  Daughter states that tomorrow will be her last day of Dificid.  She is scheduled to see [facility] tomorrow in the office.  Daughter states over the last 2 to 3 days, the patient's diarrhea has gotten worse.  She is having several bouts of diarrhea per day.  Its not extremely watery like it was before but the daughter states that the diarrhea is becoming looser.  Patient has not had any fevers but she is having increasing abdominal cramping.  Patient states that she felt so weak after dialysis today that she could not sit up.  In the ER, patient noted to be afebrile.  Blood pressure was borderline at 93/48.  She did receive 1 fluid bolus.  Laboratory evaluation was concerning for an elevated white cell count of 24.5.  When she was discharged from the hospital this month, her discharge WBC was 10.9.  No imaging was performed today.  Lactic acid was normal at 1.9.  Due to the patient's increasing white cell count and increasing diarrhea, concern for recurrent C. difficile infection.  [facility] contacted for admission.    ED Course: WBC noted to be elevated to nearly 25K.  TRH asked to admit patient.  Review of Systems:  Review of Systems  Constitutional: Negative.   HENT: Negative.    Eyes: Negative.   Respiratory: Negative.    Cardiovascular: Negative.   Gastrointestinal:  Positive for abdominal pain, diarrhea and heartburn. Negative for melena, nausea and vomiting.  Genitourinary: Negative.   Musculoskeletal: Negative.   Skin: Negative.   Neurological:  Positive for weakness.  Endo/Heme/Allergies:  Bruises/bleeds easily.       Dtr states ever since eliquis dose increased to 5 mg bid, pt's has had increasing bruising of her skin.  Psychiatric/Behavioral: Negative.    All other systems reviewed and are negative.  Past Medical History:  Diagnosis Date   Acute lower GI bleeding 02/26/2021   Arthritis of left knee    Atrial fibrillation with RVR ([place])    CAP (community acquired pneumonia) 07/02/2016   Cardiomyopathy ([place])    a. h/o LV dysfunction EF 20-25% in 2013 due to sepsis.>> improved to normal    Chest pain 11/20/2015   Chronic diastolic CHF (congestive heart failure) ([place])    10/ 2013 in setting of septic shock   Compartment syndrome ([place]) 12/18/1599   Complication of anesthesia    use a little anesthesia , per patient MD states she quit breathing (2016); hard to wake up   Dehydration    ESRD (end stage renal disease) ([place])    dialysis Tues, Thurs, Sat [address], sees dr deterding  Gastroenteritis due to norovirus    GERD (gastroesophageal reflux disease)    Glaucoma    both eyes   H/O hiatal hernia    a. CT 2017: large gastric hiatal hernia.   HCAP (healthcare-associated pneumonia) 11/21/2015   History of blood transfusion 04/13/2015   History of kidney stones    10/18/2019: per patient "has a couple currently one in each kidney"   History of nephrostomy 04/11/2015   currently inplace 04/28/2015  removed now   Hyperlipidemia    Hypertension    medication removed from regimen due  to low blood pressure    Hypokalemia 08/23/2018   Hypoxemia    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   Iron deficiency    hx   Melena    Methicillin resistant Staphylococcus aureus infection, unspecified site 03/22/2018   Nephrolithiasis 2002, 2006   bilateral   PAF (paroxysmal atrial fibrillation) ([place])    a. 10/ 2013  in setting of Septic Shock //  b. recurrent during admit for pneumonia, L effusion >> placed on Amiodarone // Coumadin for anticoagulation   PNA (pneumonia) 05/10/2016   Post-operative pain    Primary localized osteoarthritis of right hip 10/22/2019   Rectal bleeding    S/P hemodialysis catheter insertion ([place]) 04/11/2015   right anterior chest , only used once    Shortness of breath 11/06/2015   Sigmoid diverticulosis    UGI bleed 08/23/2018    Past Surgical History:  Procedure Laterality Date   AV FISTULA PLACEMENT Left 06/02/2015   Procedure: BRACHIOCEPHALIC ARTERIOVENOUS (AV) FISTULA CREATION ;  Surgeon: Conrad Pomeroy, MD;  Location: [facility];  Service: Vascular;  Laterality: Left;   [place] Left 07/27/2015   Procedure: FIRST STAGE BASILIC VEIN TRANSPOSITION LEFT UPPER ARM;  Surgeon: Conrad [place], MD;  Location: [facility];  Service: Vascular;  Laterality: Left;   [place] Left 09/2015   second phase   [place] Left 10/12/2015   Procedure: SECOND STAGE BASILIC VEIN TRANSPOSITION LEFT ARM;  Surgeon: Conrad [place], MD;  Location: [facility];  Service: Vascular;  Laterality: Left;   BREAST BIOPSY Left 08/23/07   benign fibrocystic with duct ectasia   CARDIAC CATHETERIZATION  07-11-2012  dr Irish Lack   Abnormal stress test/   normal coronary arteries/  LVEDP  56mmHg   CARDIOVASCULAR STRESS TEST  06-26-2012  dr Irish Lack   marked ischemia in the basal anterior, mid anterior, apical inferior regions/  normal LVF, ef 63%   CATARACT EXTRACTION W/ INTRAOCULAR LENS  IMPLANT, BILATERAL     COLONOSCOPY WITH PROPOFOL N/A 10/17/2016    Procedure: COLONOSCOPY WITH PROPOFOL;  Surgeon: Garlan Fair, MD;  Location: [facility];  Service: Endoscopy;  Laterality: N/A;   CYSTOSCOPY W/ URETERAL STENT PLACEMENT  04/04/2012   Procedure: CYSTOSCOPY WITH RETROGRADE PYELOGRAM/URETERAL STENT PLACEMENT;  Surgeon: Ailene Rud, MD;  Location: [facility];  Service: Urology;  Laterality: Left;   CYSTOSCOPY W/ URETERAL STENT PLACEMENT Bilateral 05/04/2015   Procedure: CYSTOSCOPY WITH BILATERAL RETROGRADE PYELOGRAM/ WITH INTERPRETATION, EXCHANGE OF RIGHT URETERAL STENT REPLACEMENT AND PLACEMENT LEFT URETERAL STENT PLACEMENT EXAMINATION OF VAGINA;  Surgeon: Carolan Clines, MD;  Location: [facility];  Service: Urology;  Laterality: Bilateral;   CYSTOSCOPY WITH STENT PLACEMENT Right 10/28/2014   Procedure: RIGHT URETERAL STENT PLACEMENT;  Surgeon: Irine Seal, MD;  Location: [facility];  Service: Urology;  Laterality: Right;   CYSTOSCOPY WITH STENT PLACEMENT Right 02/26/2015   Procedure: CYSTOSCOPY RETROGRADE PYELOGRAM WITH STENT  PLACEMENT;  Surgeon: Cleon Gustin, MD;  Location: [facility];  Service: Urology;  Laterality: Right;   CYSTOSCOPY/RETROGRADE/URETEROSCOPY/STONE EXTRACTION WITH BASKET Right 11/21/2014   Procedure: CYSTOSCOPY/RIGHT RETROGRADE PYELOGRAM/RIGHT URETEROSCOPY/BASKET EXTRACTION/RIGHT PYELOSCOPY/LASER OF STONE/RIGHT DOUBLE J STENT;  Surgeon: Carolan Clines, MD;  Location: [facility];  Service: Urology;  Laterality: Right;   ESOPHAGOGASTRODUODENOSCOPY (EGD) WITH PROPOFOL Left 08/25/2018   Procedure: ESOPHAGOGASTRODUODENOSCOPY (EGD) WITH PROPOFOL;  Surgeon: Ronald Lobo, MD;  Location: [facility];  Service: Endoscopy;  Laterality: Left;   EXTRACORPOREAL SHOCK WAVE LITHOTRIPSY  05-28-2012  &  10-08-2012   HEMATOMA EVACUATION Left 10/27/2020   Procedure: EVACUATION HEMATOMA, repair of Pseudoaneurysm;  Surgeon: Waynetta Sandy, MD;  Location: [facility];  Service: Vascular;  Laterality: Left;    HIP ARTHROPLASTY Left 06/09/2020   Procedure: ARTHROPLASTY BIPOLAR HIP (HEMIARTHROPLASTY);  Surgeon: Marchia Bond, MD;  Location: [facility];  Service: Orthopedics;  Laterality: Left;   HOLMIUM LASER APPLICATION Right 10/23/3873   Procedure: HOLMIUM LASER APPLICATION;  Surgeon: Carolan Clines, MD;  Location: [facility];  Service: Urology;  Laterality: Right;   IR GENERIC HISTORICAL  02/01/2016   IR NEPHROSTOMY EXCHANGE RIGHT 02/01/2016 Greggory Keen, MD [facility]   IR GENERIC HISTORICAL  02/24/2016   IR PATIENT EVAL TECH 0-60 MINS 02/24/2016 Aletta Edouard, MD [facility]   KNEE ARTHROSCOPY Left 02-14-2003   LAPAROSCOPIC CHOLECYSTECTOMY  03-23-2005   TOTAL ABDOMINAL HYSTERECTOMY W/ BILATERAL SALPINGOOPHORECTOMY  1993   secondary to fibroids   TOTAL KNEE ARTHROPLASTY Left 10/22/2019   TOTAL KNEE ARTHROPLASTY Left 10/22/2019   Procedure: TOTAL KNEE ARTHROPLASTY;  Surgeon: Marchia Bond, MD;  Location: [facility];  Service: Orthopedics;  Laterality: Left;   TRANSTHORACIC ECHOCARDIOGRAM  04-09-2012   normal LVF,  ef 60-65%,  mild LAE,  mild TR, trivial MR and PR     reports that she has never smoked. She has never used smokeless tobacco. She reports that she does not drink alcohol and does not use drugs.  Allergies  Allergen Reactions   Astemizole Nausea And Vomiting   Fluorouracil Rash   Vicodin [Hydrocodone-Acetaminophen] Nausea And Vomiting   Chlorhexidine Rash    Sunburn    rash   Percocet [Oxycodone-Acetaminophen] Nausea And Vomiting    Family History  Problem Relation Age of Onset   Hypertension Mother    Cancer Mother 62       breast   Dementia Mother    Hypertension Brother    Diabetes Brother    Heart disease Brother        before age 61   Cancer Father 21       pancreatic   Heart failure Paternal Grandmother    Bladder Cancer Maternal Grandfather     Prior to Admission medications   Medication Sig Start Date End Date Taking? Authorizing Provider   amiodarone (PACERONE) 200 MG tablet Take 1 tablet (200 mg total) by mouth daily. 08/24/20   Jettie Booze, MD  apixaban (ELIQUIS) 5 MG TABS tablet Take 1 tablet (5 mg total) by mouth 2 (two) times daily. 01/09/21   Dwyane Dee, MD  fidaxomicin (DIFICID) 200 MG TABS tablet Take 1 tablet (200 mg total) by mouth 2 (two) times daily. 03/03/21   Thurnell Lose, MD  gabapentin (NEURONTIN) 600 MG tablet Take 0.5 tablets (300 mg total) by mouth at bedtime. 12/02/20   Love, Ivan Anchors, PA-C  heparin 1000 unit/mL SOLN injection Heparin Sodium (Porcine) 1,000 Units/mL Catheter Lock Arterial 10/24/20 10/23/21  [provider]  multivitamin (RENA-VIT) TABS tablet Take 1 tablet by mouth at bedtime. 07/27/19   [provider]  ondansetron (ZOFRAN) 4 MG tablet Take 1 tablet (4 mg total) by mouth every 8 (eight) hours as needed for nausea or vomiting. 03/03/21   Thurnell Lose, MD  Potassium Chloride ER 20 MEQ TBCR Take 1 tablet by mouth daily. 02/13/21   [provider]  saccharomyces boulardii (FLORASTOR) 250 MG capsule Take 1 capsule (250 mg total) by mouth 2 (two) times daily. 07/08/15   Donne Hazel, MD    Physical Exam: Vitals:   03/11/21 2159 03/11/21 2214 03/11/21 2230 03/11/21 2245  BP: (!) 147/62 (!) 138/59 (!) 143/59 (!) 141/56  Pulse: 77 74 74 74  Resp: 11 13 14 18   Temp:      TempSrc:      SpO2: 100% 97% 98% 98%  Weight:      Height:        Physical Exam Vitals and nursing note reviewed.  Constitutional:      General: She is not in acute distress.    Appearance: Normal appearance. She is obese. She is not toxic-appearing or diaphoretic.     Comments: Chronically ill appearing female  HENT:     Head: Normocephalic and atraumatic.     Nose: Nose normal. No rhinorrhea.  Eyes:     General:        Right eye: No discharge.        Left eye: No discharge.  Cardiovascular:     Rate and Rhythm: Normal rate.     Pulses: Normal pulses.     Heart sounds: Murmur  heard.  Crescendo systolic murmur is present with a grade of 3/6.     Comments: LUSB harsh systolic murmur consistent with her hx of severe AS. Pulmonary:     Effort: Pulmonary effort is normal. No respiratory distress.     Breath sounds: No wheezing.  Abdominal:     General: Abdomen is protuberant. Bowel sounds are increased.     Palpations: Abdomen is soft.     Tenderness: There is generalized abdominal tenderness.     Comments: Mild generalized tenderness. No guarding. BS normal to mild hyperactive  Skin:    General: Skin is warm.     Capillary Refill: Capillary refill takes less than 2 seconds.     Findings: Bruising present.     Comments: Both upper and lower arms with bruising noted. Left UE bruit/thrill  Right ant chest wall perm cath  Neurological:     General: No focal deficit present.     Mental Status: She is alert and oriented to person, place, and time.     Labs on Admission: I have personally reviewed following labs and imaging studies  CBC: Recent Labs  Lab 03/11/21 1725  WBC 24.5*  NEUTROABS 24.3*  HGB 10.2*  HCT 33.3*  MCV 98.8  PLT 536   Basic Metabolic Panel: Recent Labs  Lab 03/11/21 2045  NA 136  K 5.3*  CL 101  CO2 28  GLUCOSE 83  BUN 15  CREATININE 2.39*  CALCIUM 7.8*   GFR: Estimated Creatinine Clearance: 18.5 mL/min (A) (by C-G formula based on SCr of 2.39 mg/dL (H)). Liver Function Tests: Recent Labs  Lab 03/11/21 2045  AST 39  ALT 17  ALKPHOS 119  BILITOT 1.1  PROT 3.9*  ALBUMIN 1.6*   No results for input(s): LIPASE, AMYLASE in the last 168 hours. No results for input(s):  AMMONIA in the last 168 hours. Coagulation Profile: No results for input(s): INR, PROTIME in the last 168 hours. Cardiac Enzymes: No results for input(s): CKTOTAL, CKMB, CKMBINDEX, TROPONINI in the last 168 hours. BNP (last 3 results) No results for input(s): PROBNP in the last 8760 hours. HbA1C: No results for input(s): HGBA1C in the last 72  hours. CBG: No results for input(s): GLUCAP in the last 168 hours. Lipid Profile: No results for input(s): CHOL, HDL, LDLCALC, TRIG, CHOLHDL, LDLDIRECT in the last 72 hours. Thyroid Function Tests: Recent Labs    03/11/21 1725  TSH 2.690   Anemia Panel: No results for input(s): VITAMINB12, FOLATE, FERRITIN, TIBC, IRON, RETICCTPCT in the last 72 hours. Urine analysis:    Component Value Date/Time   COLORURINE YELLOW 12/31/2020 1840   APPEARANCEUR TURBID (A) 12/31/2020 1840   LABSPEC  12/31/2020 1840    TEST NOT REPORTED DUE TO COLOR INTERFERENCE OF URINE PIGMENT   PHURINE  12/31/2020 1840    TEST NOT REPORTED DUE TO COLOR INTERFERENCE OF URINE PIGMENT   GLUCOSEU (A) 12/31/2020 1840    TEST NOT REPORTED DUE TO COLOR INTERFERENCE OF URINE PIGMENT   HGBUR (A) 12/31/2020 1840    TEST NOT REPORTED DUE TO COLOR INTERFERENCE OF URINE PIGMENT   BILIRUBINUR (A) 12/31/2020 1840    TEST NOT REPORTED DUE TO COLOR INTERFERENCE OF URINE PIGMENT   BILIRUBINUR neg 09/18/2014 0920   KETONESUR (A) 12/31/2020 1840    TEST NOT REPORTED DUE TO COLOR INTERFERENCE OF URINE PIGMENT   PROTEINUR (A) 12/31/2020 1840    TEST NOT REPORTED DUE TO COLOR INTERFERENCE OF URINE PIGMENT   UROBILINOGEN 0.2 05/02/2015 2020   NITRITE (A) 12/31/2020 1840    TEST NOT REPORTED DUE TO COLOR INTERFERENCE OF URINE PIGMENT   LEUKOCYTESUR (A) 12/31/2020 1840    TEST NOT REPORTED DUE TO COLOR INTERFERENCE OF URINE PIGMENT    Radiological Exams on Admission: I have personally reviewed images No results found.  EKG: I have personally reviewed EKG: shows NSR   Assessment/Plan Principal Problem:   Recurrent Clostridioides difficile diarrhea Active Problems:   ESRD on hemodialysis ([place]) - on Tu, Th, Sat   Essential hypertension, benign   Chronic diastolic CHF (congestive heart failure) ([facility])   AF (paroxysmal atrial fibrillation) ([facility])   Anemia in chronic kidney disease   Aortic stenosis - severe. last echo 09-2020  showed Aortic valve mean gradient measures 56.0 mmHg    Recurrent Clostridioides difficile diarrhea Admit to medical bed. Appears that pt has failed dificid. Is currently taking the last dose of dificid(10 day course) but her intensity of loose stools has increased over the past 2-3 days.  Also with her increasing leukocytosis and lethargy, makes me concerned about recurrent c. Diff. Would recommend getting ID and GI(has been [facility] in the past) to consider fecal microbiota transplant(FMT). Will continue Dificid for now.  ESRD on hemodialysis ([place]) - on Tu, Th, Sat Will need to contact nephrology to notify them of pt's admission so they can plan for HD on Saturday.  Essential hypertension, benign Stable.  Chronic diastolic CHF (congestive heart failure) ([facility]) Stable. Does not appear volume overloaded. Just had HD today.  AF (paroxysmal atrial fibrillation) ([place]) Continue with eliquis(recently had DVT on left UE), amiodarone.  Anemia in chronic kidney disease Chronic.  Aortic stenosis - severe. last echo 09-2020 showed Aortic valve mean gradient measures 56.0 mmHg Last echo 09-2020 showed Aortic valve mean gradient measures 56.0 mmHg. Aortic valve peak gradient  measures 92.9 mmHg. Aortic valve area, by VTI measures 0.92 cm.   DVT prophylaxis: Eliquis Code Status: Full Code. Confirmed full code with pt and dtr Family Communication: discussed with pt and dtr michelle at bedside  Disposition Plan: return to home with dtr michelle  Consults called: none called from ER  Admission status: Inpatient, Med-Surg   Kristopher Oppenheim, DO [facility] 03/11/2021, 11:14 PM

## 2021-03-11 NOTE — ED Triage Notes (Signed)
Pt BIB EMS c/o gen weakness, had dialysis today. Per daughter, pt doesn't seem well. Has MD appt tomorrow to recheck for C diff, diarrhea has improved per pt. Denies chest pain or sob.  BP 118/76 HR 68 99% RA  CBG 105

## 2021-03-11 NOTE — ED Notes (Signed)
This RN was able to draw a small amt of blood from second PIV attempt. Pt is a difficult stick, IV consult was put in.

## 2021-03-11 NOTE — Assessment & Plan Note (Signed)
Last echo 09-2020 showed Aortic valve mean gradient measures 56.0 mmHg. Aortic valve peak gradient measures 92.9 mmHg. Aortic valve area, by VTI measures 0.92 cm.

## 2021-03-11 NOTE — Assessment & Plan Note (Signed)
Continue with eliquis(recently had DVT on left UE), amiodarone.

## 2021-03-11 NOTE — Subjective & Objective (Signed)
CC: weakness, increasing diarrhea HPI: 75 year old female with a history of end-stage renal disease on hemodialysis on Tuesday Thursday Saturday, recurrent C. difficile diarrhea, hypertension, chronic diastolic heart failure, severe aortic stenosis, anemia of chronic kidney disease who presents to the ER today with 2 to 3 days of worsening diarrhea.  Patient had hemodialysis today.  She came home and felt very weak.  Daughter is at the bedside.  Patient lives with her daughter.  Patient recently discharged in the middle of September 2022 after a long hospital stay for recurrent C. difficile diarrhea.  She was discharged on 10 days of p.o. Dificid.  Daughter states that tomorrow will be her last day of Dificid.  She is scheduled to see [facility] tomorrow in the office.  Daughter states over the last 2 to 3 days, the patient's diarrhea has gotten worse.  She is having several bouts of diarrhea per day.  Its not extremely watery like it was before but the daughter states that the diarrhea is becoming looser.  Patient has not had any fevers but she is having increasing abdominal cramping.  Patient states that she felt so weak after dialysis today that she could not sit up.  In the ER, patient noted to be afebrile.  Blood pressure was borderline at 93/48.  She did receive 1 fluid bolus.  Laboratory evaluation was concerning for an elevated white cell count of 24.5.  When she was discharged from the hospital this month, her discharge WBC was 10.9.  No imaging was performed today.  Lactic acid was normal at 1.9.  Due to the patient's increasing white cell count and increasing diarrhea, concern for recurrent C. difficile infection.  [facility] contacted for admission.

## 2021-03-11 NOTE — ED Notes (Signed)
Unable to obtain second set of blood cultures.  

## 2021-03-11 NOTE — ED Provider Notes (Addendum)
[facility] EMERGENCY DEPARTMENT Provider Note   CSN: [id number] Arrival date & time: 03/11/21  1718     History Chief Complaint  Patient presents with   Weakness    [name] is a 75 y.o. female with PMH ESRD on hemodialysis Tuesday Thursday Saturday, severe aortic stenosis, chronic diastolic CHF, paroxysmal A. fib on Eliquis, frequent UTIs previously on suppression therapy with Keflex and known history of ESBL Proteus UTI who presents to the emergency department for evaluation of generalized weakness.  Patient was recently discharged on 03/03/2021 after an extended stay for persistent C. difficile colitis.  She is currently on Dificid and family ember states that she feels that the diarrhea frequency is improving, but after receiving dialysis today the patient had significant weakness.  Denies numbness, tingling, focal motor weakness or other neurologic deficits.  Denies vomiting, nausea, fevers or other systemic symptoms.  On initial presentation, patient with softer blood pressures and is overall ill-appearing.   Weakness Associated symptoms: diarrhea   Associated symptoms: no abdominal pain, no arthralgias, no chest pain, no cough, no dysuria, no fever, no seizures, no shortness of breath and no vomiting       Past Medical History:  Diagnosis Date   Arthritis of left knee    Cardiomyopathy ([place])    a. h/o LV dysfunction EF 20-25% in 2013 due to sepsis.>> improved to normal    Chronic diastolic CHF (congestive heart failure) ([place])    10/ 2013 in setting of septic shock   Complication of anesthesia    use a little anesthesia , per patient MD states she quit breathing (2016); hard to wake up   ESRD (end stage renal disease) ([place])    dialysis Tues, Thurs, Sat [address], sees dr deterding    GERD (gastroesophageal reflux disease)    Glaucoma    both eyes   H/O hiatal hernia    a. CT 2017: large gastric hiatal hernia.   History of blood transfusion  04/13/2015   History of kidney stones    10/18/2019: per patient "has a couple currently one in each kidney"   History of nephrostomy 04/11/2015   currently inplace 04/28/2015  removed now   Hyperlipidemia    Hypertension    medication removed from regimen due to low blood pressure    Iron deficiency    hx   Nephrolithiasis 2002, 2006   bilateral   PAF (paroxysmal atrial fibrillation) ([place])    a. 10/ 2013  in setting of Septic Shock //  b. 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 02/26/2021   Chronic diarrhea 01/05/2021   Aortic stenosis 01/05/2021   Physical deconditioning 12/31/2020   Chronic diastolic heart failure ([place]) 12/11/2020   Anxiety 12/11/2020   Malignant hypertensive chronic kidney disease 12/11/2020   Nonrheumatic aortic (valve) stenosis 12/11/2020   Obesity 12/11/2020   Pulmonary hypertension ([place]) 12/11/2020   Current mild episode of major depressive disorder (HCC)    Chronic UTI    Loose stools    ESRD on hemodialysis ([place])    Acute on  chronic anemia    Post-operative pain    Generalized weakness 11/03/2020   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   Compartment syndrome ([place]) 10/27/2020   Stress fracture, hip, unspecified, initial encounter for fracture 06/25/2020   Hip fracture ([place]) 06/07/2020   Acute hypoxemic respiratory failure ([place]) 06/07/2020   Atrial fibrillation ([place]) 06/07/2020   Gout, unspecified 11/14/2019   Primary localized osteoarthritis of left knee 10/22/2019   S/P TKR (total knee replacement), left 10/22/2019   Allergy, unspecified,  initial encounter 04/08/2019   Gastroenteritis due to norovirus    Hiatal hernia    Melena    UGI bleed 08/23/2018   Hypokalemia 08/23/2018   Hyperlipidemia 08/23/2018   GERD (gastroesophageal reflux disease) 08/23/2018   Personal history of Methicillin resistant Staphylococcus aureus infection 05/02/2018   Methicillin resistant Staphylococcus aureus infection, unspecified site 03/22/2018   Hypothyroidism, unspecified 03/09/2017   CAP (community acquired pneumonia) 07/02/2016   Dehydration    PNA (pneumonia) 05/10/2016   Pleural effusion on left    Pleurisy    Hypoxemia    HCAP (healthcare-associated pneumonia) 11/21/2015   S/P thoracentesis    Chest pain 11/20/2015   Pleural effusion, left 11/20/2015   Pericardial effusion    Pleural effusion    Encounter for therapeutic drug monitoring 11/13/2015   Anemia in chronic kidney disease 11/06/2015   Iron deficiency anemia, unspecified 11/06/2015   Secondary hyperparathyroidism of renal origin ([place]) 11/06/2015   Shortness of breath 11/06/2015   Atrial fibrillation with RVR (HCC)    AF (paroxysmal atrial fibrillation) ([place]) 11/05/2015   Severe protein-calorie malnutrition ([place]) 05/27/2015   Chronic diastolic CHF (congestive heart failure) ([place]) 02/25/2015   Normocytic anemia 11/23/2014   Essential hypertension, benign 08/14/2013   Glaucoma     Past Surgical History:  Procedure Laterality Date   AV FISTULA PLACEMENT Left 06/02/2015   Procedure: BRACHIOCEPHALIC ARTERIOVENOUS (AV) FISTULA CREATION ;  Surgeon: Conrad [place] Shore, MD;  Location: [place];  Service: Vascular;  Laterality: Left;   [place] Left 07/27/2015   Procedure: FIRST STAGE BASILIC VEIN TRANSPOSITION LEFT UPPER ARM;  Surgeon: Conrad Clayton, MD;  Location: [place];  Service: Vascular;  Laterality: Left;   [place] Left 09/2015   second phase   [place] Left 10/12/2015   Procedure: SECOND STAGE BASILIC VEIN TRANSPOSITION  LEFT ARM;  Surgeon: Conrad [place], MD;  Location: [place];  Service: Vascular;  Laterality: Left;   BREAST BIOPSY Left 08/23/07   benign fibrocystic with duct ectasia   CARDIAC CATHETERIZATION  07-11-2012  dr Irish Lack   Abnormal stress test/   normal coronary arteries/  LVEDP  82mmHg   CARDIOVASCULAR STRESS TEST  06-26-2012  dr Irish Lack   marked ischemia in the basal anterior, mid anterior, apical inferior regions/  normal LVF, ef 63%   CATARACT EXTRACTION W/ INTRAOCULAR LENS  IMPLANT, BILATERAL     COLONOSCOPY WITH PROPOFOL N/A 10/17/2016   Procedure: COLONOSCOPY WITH PROPOFOL;  Surgeon: Garlan Fair, MD;  Location: [facility];  Service: Endoscopy;  Laterality: N/A;   CYSTOSCOPY W/ URETERAL STENT PLACEMENT  04/04/2012   Procedure: CYSTOSCOPY WITH RETROGRADE PYELOGRAM/URETERAL STENT PLACEMENT;  Surgeon: Ailene Rud, MD;  Location: [place];  Service: Urology;  Laterality: Left;   CYSTOSCOPY W/ URETERAL STENT PLACEMENT Bilateral 05/04/2015   Procedure: CYSTOSCOPY WITH BILATERAL RETROGRADE PYELOGRAM/ WITH INTERPRETATION, EXCHANGE OF RIGHT URETERAL STENT REPLACEMENT AND PLACEMENT LEFT URETERAL STENT PLACEMENT EXAMINATION OF VAGINA;  Surgeon: Carolan Clines, MD;  Location:  [facility];  Service: Urology;  Laterality: Bilateral;   CYSTOSCOPY WITH STENT PLACEMENT Right 10/28/2014   Procedure: RIGHT URETERAL STENT PLACEMENT;  Surgeon: Irine Seal, MD;  Location: [facility];  Service: Urology;  Laterality: Right;   CYSTOSCOPY WITH STENT PLACEMENT Right 02/26/2015   Procedure: CYSTOSCOPY RETROGRADE PYELOGRAM WITH STENT PLACEMENT;  Surgeon: Cleon Gustin, MD;  Location: [facility];  Service: Urology;  Laterality: Right;   CYSTOSCOPY/RETROGRADE/URETEROSCOPY/STONE EXTRACTION WITH BASKET Right 11/21/2014   Procedure: CYSTOSCOPY/RIGHT RETROGRADE PYELOGRAM/RIGHT URETEROSCOPY/BASKET EXTRACTION/RIGHT PYELOSCOPY/LASER OF STONE/RIGHT DOUBLE J STENT;  Surgeon: Carolan Clines, MD;  Location: [place];  Service: Urology;  Laterality: Right;   ESOPHAGOGASTRODUODENOSCOPY (EGD) WITH PROPOFOL Left 08/25/2018   Procedure: ESOPHAGOGASTRODUODENOSCOPY (EGD) WITH PROPOFOL;  Surgeon: Ronald Lobo, MD;  Location: [place];  Service: Endoscopy;  Laterality: Left;   EXTRACORPOREAL SHOCK WAVE LITHOTRIPSY  05-28-2012  &  10-08-2012   HEMATOMA EVACUATION Left 10/27/2020   Procedure: EVACUATION HEMATOMA, repair of Pseudoaneurysm;  Surgeon: Waynetta Sandy, MD;  Location: [place];  Service: Vascular;  Laterality: Left;   HIP ARTHROPLASTY Left 06/09/2020   Procedure: ARTHROPLASTY BIPOLAR HIP (HEMIARTHROPLASTY);  Surgeon: Marchia Bond, MD;  Location: [place];  Service: Orthopedics;  Laterality: Left;   HOLMIUM LASER APPLICATION Right 02/24/2835   Procedure: HOLMIUM LASER APPLICATION;  Surgeon: Carolan Clines, MD;  Location: [facility];  Service: Urology;  Laterality: Right;   IR GENERIC HISTORICAL  02/01/2016   IR NEPHROSTOMY EXCHANGE RIGHT 02/01/2016 Greggory Keen, MD [facility]   IR GENERIC HISTORICAL  02/24/2016   IR PATIENT EVAL TECH 0-60 MINS 02/24/2016 Aletta Edouard, MD [facility]   KNEE ARTHROSCOPY Left 02-14-2003   LAPAROSCOPIC CHOLECYSTECTOMY  03-23-2005   TOTAL ABDOMINAL HYSTERECTOMY W/ BILATERAL SALPINGOOPHORECTOMY  1993   secondary to fibroids   TOTAL KNEE ARTHROPLASTY Left 10/22/2019   TOTAL KNEE ARTHROPLASTY Left 10/22/2019   Procedure: TOTAL KNEE ARTHROPLASTY;  Surgeon: Marchia Bond, MD;  Location: [place];  Service: Orthopedics;  Laterality: Left;   TRANSTHORACIC ECHOCARDIOGRAM  04-09-2012   normal LVF,  ef 60-65%,  mild LAE,  mild TR, trivial MR and PR     OB History     Gravida  1   Para  1   Term  1   Preterm  0   AB  0   Living  1      SAB  0   IAB  0   Ectopic  0   Multiple      Live Births  1           Family History  Problem Relation Age of Onset   Hypertension Mother    Cancer Mother 4       breast    Dementia Mother    Hypertension Brother    Diabetes Brother    Heart disease Brother        before age 72   Cancer Father 34       pancreatic   Heart failure Paternal Grandmother    Bladder Cancer Maternal Grandfather     Social History   Tobacco Use   Smoking status: Never   Smokeless tobacco: Never  Vaping Use   Vaping Use: Never used  Substance Use Topics   Alcohol use: No    Alcohol/week: 0.0 standard drinks   Drug use: No    Home Medications Prior to Admission medications   Medication Sig Start Date End Date Taking? Authorizing Provider  amiodarone (PACERONE)  200 MG tablet Take 1 tablet (200 mg total) by mouth daily. 08/24/20   Jettie Booze, MD  apixaban (ELIQUIS) 5 MG TABS tablet Take 1 tablet (5 mg total) by mouth 2 (two) times daily. 01/09/21   Dwyane Dee, MD  fidaxomicin (DIFICID) 200 MG TABS tablet Take 1 tablet (200 mg total) by mouth 2 (two) times daily. 03/03/21   Thurnell Lose, MD  gabapentin (NEURONTIN) 600 MG tablet Take 0.5 tablets (300 mg total) by mouth at bedtime. 12/02/20   Love, Ivan Anchors, PA-C  heparin 1000 unit/mL SOLN injection Heparin Sodium (Porcine) 1,000 Units/mL Catheter Lock Arterial 10/24/20 10/23/21  [provider]  multivitamin (RENA-VIT) TABS tablet Take 1 tablet by mouth at bedtime. 07/27/19   [provider]  ondansetron (ZOFRAN) 4 MG tablet Take 1 tablet (4 mg total) by mouth every 8 (eight) hours as needed for nausea or vomiting. 03/03/21   Thurnell Lose, MD  Potassium Chloride ER 20 MEQ TBCR Take 1 tablet by mouth daily. 02/13/21   [provider]  saccharomyces boulardii (FLORASTOR) 250 MG capsule Take 1 capsule (250 mg total) by mouth 2 (two) times daily. 07/08/15   Donne Hazel, MD    Allergies    Astemizole, Fluorouracil, Vicodin [hydrocodone-acetaminophen], Chlorhexidine, and Percocet [oxycodone-acetaminophen]  Review of Systems   Review of Systems  Constitutional:  Positive for fatigue.  Negative for chills and fever.  HENT:  Negative for ear pain and sore throat.   Eyes:  Negative for pain and visual disturbance.  Respiratory:  Negative for cough and shortness of breath.   Cardiovascular:  Negative for chest pain and palpitations.  Gastrointestinal:  Positive for diarrhea. Negative for abdominal pain and vomiting.  Genitourinary:  Negative for dysuria and hematuria.  Musculoskeletal:  Negative for arthralgias and back pain.  Skin:  Negative for color change and rash.  Neurological:  Positive for weakness. Negative for seizures and syncope.  All other systems reviewed and are negative.  Physical Exam Updated Vital Signs BP (!) 141/65   Pulse 73   Temp 98.1 F (36.7 C) (Oral)   Resp 12   Ht 5\' 3"  (1.6 m)   Wt 68 kg   LMP 01/19/1992 (Approximate)   SpO2 98%   BMI 26.57 kg/m   Physical Exam Vitals and nursing note reviewed.  Constitutional:      General: She is not in acute distress.    Appearance: She is well-developed. She is ill-appearing.  HENT:     Head: Normocephalic and atraumatic.  Eyes:     Conjunctiva/sclera: Conjunctivae normal.  Cardiovascular:     Rate and Rhythm: Normal rate and regular rhythm.     Heart sounds: No murmur heard. Pulmonary:     Effort: Pulmonary effort is normal. No respiratory distress.     Breath sounds: Normal breath sounds.  Abdominal:     Palpations: Abdomen is soft.     Tenderness: There is no abdominal tenderness.  Musculoskeletal:     Cervical back: Neck supple.  Skin:    General: Skin is warm and dry.     Coloration: Skin is pale.  Neurological:     Mental Status: She is alert.    ED Results / Procedures / Treatments   Labs (all labs ordered are listed, but only abnormal results are displayed) Labs Reviewed  CBC WITH DIFFERENTIAL/PLATELET - Abnormal; Notable for the following components:      Result Value   WBC 24.5 (*)    RBC  3.37 (*)    Hemoglobin 10.2 (*)    HCT 33.3 (*)    RDW 23.8 (*)    Neutro  Abs 24.3 (*)    Lymphs Abs 0.2 (*)    Monocytes Absolute 0.0 (*)    All other components within normal limits  COMPREHENSIVE METABOLIC PANEL - Abnormal; Notable for the following components:   Potassium 5.3 (*)    Creatinine, Ser 2.39 (*)    Calcium 7.8 (*)    Total Protein 3.9 (*)    Albumin 1.6 (*)    GFR, Estimated 21 (*)    All other components within normal limits  TROPONIN I (HIGH SENSITIVITY) - Abnormal; Notable for the following components:   Troponin I (High Sensitivity) 56 (*)    All other components within normal limits  TROPONIN I (HIGH SENSITIVITY) - Abnormal; Notable for the following components:   Troponin I (High Sensitivity) 56 (*)    All other components within normal limits  CULTURE, BLOOD (ROUTINE X 2)  CULTURE, BLOOD (ROUTINE X 2)  TSH  LACTIC ACID, PLASMA  URINALYSIS, ROUTINE W REFLEX MICROSCOPIC    EKG None  Radiology No results found.  Procedures Procedures   Medications Ordered in ED Medications  lactated ringers bolus 500 mL (0 mLs Intravenous Stopped 03/11/21 2123)    ED Course  I have reviewed the triage vital signs and the nursing notes.  Pertinent labs & imaging results that were available during my care of the patient were reviewed by me and considered in my medical decision making (see chart for details).    MDM Rules/Calculators/A&P                           Seen in the emergency department for evaluation of generalized weakness after dialysis.  Physical exam reveals an ill-appearing pale patient but is otherwise unremarkable.  Laboratory evaluation concerning with a significant leukocytosis to 24.5, hemoglobin 10.2, troponin elevated to 56 but delta troponin stable at 56.  Initial lactate 1.9, chemistry with hyperkalemia 5.3, creatinine elevation to 2.39 which is consistent with a patient who has renal failure but recently had dialysis.  She was given a 500 cc bolus that corrected her hypotension.  On reevaluation, patient still  ill-appearing and having persistent diarrhea here in the emergency department.  Concerned that she is currently failing her Dificid treatment and require admission for persistent leukocytosis and overall malaise.  We attempted to straight cath here in the emergency department to rule out UTI as the source of her leukocytosis, but we were unsuccessful and bladder scan had no urine in the bladder. All signs Currently point towards Dificid failure at this time.  Patient then admitted to medicine. Final Clinical Impression(s) / ED Diagnoses Final diagnoses:  C. difficile colitis    Rx / DC Orders ED Discharge Orders     None        Brittanee Ghazarian, MD 03/11/21 2221    Teressa Lower, MD 03/11/21 2222

## 2021-03-11 NOTE — Assessment & Plan Note (Signed)
Chronic. 

## 2021-03-11 NOTE — Assessment & Plan Note (Signed)
Will need to contact nephrology to notify them of pt's admission so they can plan for HD on Saturday.

## 2021-03-12 ENCOUNTER — Other Ambulatory Visit (HOSPITAL_COMMUNITY): Payer: Self-pay

## 2021-03-12 DIAGNOSIS — I429 Cardiomyopathy, unspecified: Secondary | ICD-10-CM | POA: Diagnosis present

## 2021-03-12 DIAGNOSIS — L89322 Pressure ulcer of left buttock, stage 2: Secondary | ICD-10-CM | POA: Diagnosis present

## 2021-03-12 DIAGNOSIS — A0472 Enterocolitis due to Clostridium difficile, not specified as recurrent: Secondary | ICD-10-CM | POA: Diagnosis not present

## 2021-03-12 DIAGNOSIS — A0471 Enterocolitis due to Clostridium difficile, recurrent: Secondary | ICD-10-CM | POA: Diagnosis present

## 2021-03-12 DIAGNOSIS — Z20822 Contact with and (suspected) exposure to covid-19: Secondary | ICD-10-CM | POA: Diagnosis present

## 2021-03-12 DIAGNOSIS — G929 Unspecified toxic encephalopathy: Secondary | ICD-10-CM | POA: Diagnosis not present

## 2021-03-12 DIAGNOSIS — N186 End stage renal disease: Secondary | ICD-10-CM | POA: Diagnosis present

## 2021-03-12 DIAGNOSIS — D631 Anemia in chronic kidney disease: Secondary | ICD-10-CM | POA: Diagnosis present

## 2021-03-12 DIAGNOSIS — Z7189 Other specified counseling: Secondary | ICD-10-CM | POA: Diagnosis not present

## 2021-03-12 DIAGNOSIS — E162 Hypoglycemia, unspecified: Secondary | ICD-10-CM | POA: Diagnosis not present

## 2021-03-12 DIAGNOSIS — I350 Nonrheumatic aortic (valve) stenosis: Secondary | ICD-10-CM

## 2021-03-12 DIAGNOSIS — E785 Hyperlipidemia, unspecified: Secondary | ICD-10-CM | POA: Diagnosis present

## 2021-03-12 DIAGNOSIS — R188 Other ascites: Secondary | ICD-10-CM | POA: Diagnosis not present

## 2021-03-12 DIAGNOSIS — D72829 Elevated white blood cell count, unspecified: Secondary | ICD-10-CM | POA: Diagnosis not present

## 2021-03-12 DIAGNOSIS — N390 Urinary tract infection, site not specified: Secondary | ICD-10-CM | POA: Diagnosis present

## 2021-03-12 DIAGNOSIS — Z992 Dependence on renal dialysis: Secondary | ICD-10-CM | POA: Diagnosis not present

## 2021-03-12 DIAGNOSIS — I5042 Chronic combined systolic (congestive) and diastolic (congestive) heart failure: Secondary | ICD-10-CM | POA: Diagnosis present

## 2021-03-12 DIAGNOSIS — N2581 Secondary hyperparathyroidism of renal origin: Secondary | ICD-10-CM | POA: Diagnosis present

## 2021-03-12 DIAGNOSIS — I953 Hypotension of hemodialysis: Secondary | ICD-10-CM | POA: Diagnosis not present

## 2021-03-12 DIAGNOSIS — A419 Sepsis, unspecified organism: Secondary | ICD-10-CM | POA: Diagnosis not present

## 2021-03-12 DIAGNOSIS — A4189 Other specified sepsis: Secondary | ICD-10-CM | POA: Diagnosis present

## 2021-03-12 DIAGNOSIS — F419 Anxiety disorder, unspecified: Secondary | ICD-10-CM | POA: Diagnosis present

## 2021-03-12 DIAGNOSIS — G9340 Encephalopathy, unspecified: Secondary | ICD-10-CM | POA: Diagnosis not present

## 2021-03-12 DIAGNOSIS — Z66 Do not resuscitate: Secondary | ICD-10-CM | POA: Diagnosis not present

## 2021-03-12 DIAGNOSIS — Z515 Encounter for palliative care: Secondary | ICD-10-CM | POA: Diagnosis not present

## 2021-03-12 DIAGNOSIS — L89156 Pressure-induced deep tissue damage of sacral region: Secondary | ICD-10-CM | POA: Diagnosis present

## 2021-03-12 DIAGNOSIS — L89312 Pressure ulcer of right buttock, stage 2: Secondary | ICD-10-CM | POA: Diagnosis present

## 2021-03-12 DIAGNOSIS — I480 Paroxysmal atrial fibrillation: Secondary | ICD-10-CM | POA: Diagnosis not present

## 2021-03-12 DIAGNOSIS — R531 Weakness: Secondary | ICD-10-CM | POA: Diagnosis present

## 2021-03-12 DIAGNOSIS — I5032 Chronic diastolic (congestive) heart failure: Secondary | ICD-10-CM | POA: Diagnosis not present

## 2021-03-12 DIAGNOSIS — I132 Hypertensive heart and chronic kidney disease with heart failure and with stage 5 chronic kidney disease, or end stage renal disease: Secondary | ICD-10-CM | POA: Diagnosis present

## 2021-03-12 LAB — CBC WITH DIFFERENTIAL/PLATELET
Abs Immature Granulocytes: 0.12 10*3/uL — ABNORMAL HIGH (ref 0.00–0.07)
Basophils Absolute: 0 10*3/uL (ref 0.0–0.1)
Basophils Relative: 0 %
Eosinophils Absolute: 0.1 10*3/uL (ref 0.0–0.5)
Eosinophils Relative: 0 %
HCT: 27.8 % — ABNORMAL LOW (ref 36.0–46.0)
Hemoglobin: 8.8 g/dL — ABNORMAL LOW (ref 12.0–15.0)
Immature Granulocytes: 1 %
Lymphocytes Relative: 2 %
Lymphs Abs: 0.3 10*3/uL — ABNORMAL LOW (ref 0.7–4.0)
MCH: 30.6 pg (ref 26.0–34.0)
MCHC: 31.7 g/dL (ref 30.0–36.0)
MCV: 96.5 fL (ref 80.0–100.0)
Monocytes Absolute: 0.4 10*3/uL (ref 0.1–1.0)
Monocytes Relative: 2 %
Neutro Abs: 18.2 10*3/uL — ABNORMAL HIGH (ref 1.7–7.7)
Neutrophils Relative %: 95 %
Platelets: 212 10*3/uL (ref 150–400)
RBC: 2.88 MIL/uL — ABNORMAL LOW (ref 3.87–5.11)
RDW: 23.9 % — ABNORMAL HIGH (ref 11.5–15.5)
WBC: 19.1 10*3/uL — ABNORMAL HIGH (ref 4.0–10.5)
nRBC: 0 % (ref 0.0–0.2)

## 2021-03-12 LAB — RESP PANEL BY RT-PCR (FLU A&B, COVID) ARPGX2
Influenza A by PCR: NEGATIVE
Influenza B by PCR: NEGATIVE
SARS Coronavirus 2 by RT PCR: NEGATIVE

## 2021-03-12 LAB — COMPREHENSIVE METABOLIC PANEL
ALT: 12 U/L (ref 0–44)
AST: 16 U/L (ref 15–41)
Albumin: 1.4 g/dL — ABNORMAL LOW (ref 3.5–5.0)
Alkaline Phosphatase: 104 U/L (ref 38–126)
Anion gap: 7 (ref 5–15)
BUN: 17 mg/dL (ref 8–23)
CO2: 28 mmol/L (ref 22–32)
Calcium: 7.8 mg/dL — ABNORMAL LOW (ref 8.9–10.3)
Chloride: 102 mmol/L (ref 98–111)
Creatinine, Ser: 2.57 mg/dL — ABNORMAL HIGH (ref 0.44–1.00)
GFR, Estimated: 19 mL/min — ABNORMAL LOW (ref >60)
Glucose, Bld: 74 mg/dL (ref 70–99)
Potassium: 5.1 mmol/L (ref 3.5–5.1)
Sodium: 137 mmol/L (ref 135–145)
Total Bilirubin: 1.2 mg/dL (ref 0.3–1.2)
Total Protein: 3.4 g/dL — ABNORMAL LOW (ref 6.5–8.1)

## 2021-03-12 MED ORDER — LIDOCAINE HCL (PF) 1 % IJ SOLN: 5.0000 mL | INTRAMUSCULAR | Status: DC | PRN

## 2021-03-12 MED ORDER — LORAZEPAM 0.5 MG PO TABS
0.5000 mg | ORAL_TABLET | Freq: Every day | ORAL | Status: DC | PRN
  Administered 2021-03-12 – 2021-03-14 (×2): 0.5 mg via ORAL
  Filled 2021-03-12 (×2): qty 1

## 2021-03-12 MED ORDER — ACETAMINOPHEN 325 MG PO TABS
650.0000 mg | ORAL_TABLET | Freq: Four times a day (QID) | ORAL | Status: DC | PRN
  Administered 2021-03-12 (×2): 650 mg via ORAL
  Filled 2021-03-12 (×2): qty 2

## 2021-03-12 MED ORDER — ALTEPLASE 2 MG IJ SOLR: 2.0000 mg | Freq: Once | INTRAMUSCULAR | Status: DC | PRN

## 2021-03-12 MED ORDER — APIXABAN 5 MG PO TABS
5.0000 mg | ORAL_TABLET | Freq: Two times a day (BID) | ORAL | Status: DC
  Administered 2021-03-12 – 2021-03-14 (×5): 5 mg via ORAL
  Filled 2021-03-12 (×7): qty 1

## 2021-03-12 MED ORDER — SACCHAROMYCES BOULARDII 250 MG PO CAPS: 250.0000 mg | ORAL_CAPSULE | Freq: Two times a day (BID) | ORAL | Status: DC

## 2021-03-12 MED ORDER — PENTAFLUOROPROP-TETRAFLUOROETH EX AERO
1.0000 "application " | INHALATION_SPRAY | CUTANEOUS | Status: DC | PRN
  Filled 2021-03-12: qty 116

## 2021-03-12 MED ORDER — SODIUM CHLORIDE 0.9 % IV SOLN: 100.0000 mL | INTRAVENOUS | Status: DC | PRN

## 2021-03-12 MED ORDER — SACCHAROMYCES BOULARDII 250 MG PO CAPS
250.0000 mg | ORAL_CAPSULE | Freq: Every day | ORAL | Status: DC
  Administered 2021-03-12 – 2021-03-14 (×3): 250 mg via ORAL
  Filled 2021-03-12 (×5): qty 1

## 2021-03-12 MED ORDER — TEMAZEPAM 7.5 MG PO CAPS
7.5000 mg | ORAL_CAPSULE | Freq: Every evening | ORAL | Status: DC | PRN
  Administered 2021-03-12: 7.5 mg via ORAL
  Filled 2021-03-12: qty 1

## 2021-03-12 MED ORDER — ONDANSETRON HCL 4 MG PO TABS
4.0000 mg | ORAL_TABLET | Freq: Four times a day (QID) | ORAL | Status: DC | PRN
  Administered 2021-03-12: 4 mg via ORAL
  Filled 2021-03-12: qty 1

## 2021-03-12 MED ORDER — FIDAXOMICIN 200 MG PO TABS
200.0000 mg | ORAL_TABLET | Freq: Two times a day (BID) | ORAL | Status: DC
  Administered 2021-03-12 – 2021-03-14 (×6): 200 mg via ORAL
  Filled 2021-03-12 (×8): qty 1

## 2021-03-12 MED ORDER — ACETAMINOPHEN 650 MG RE SUPP: 650.0000 mg | Freq: Four times a day (QID) | RECTAL | Status: DC | PRN

## 2021-03-12 MED ORDER — LIDOCAINE-PRILOCAINE 2.5-2.5 % EX CREA
1.0000 "application " | TOPICAL_CREAM | CUTANEOUS | Status: DC | PRN
  Filled 2021-03-12: qty 5

## 2021-03-12 MED ORDER — HEPARIN SODIUM (PORCINE) 1000 UNIT/ML DIALYSIS
1000.0000 [IU] | INTRAMUSCULAR | Status: DC | PRN
  Administered 2021-03-13: 1000 [IU] via INTRAVENOUS_CENTRAL
  Filled 2021-03-12 (×2): qty 1

## 2021-03-12 MED ORDER — FENTANYL CITRATE PF 50 MCG/ML IJ SOSY: 25.0000 ug | PREFILLED_SYRINGE | Freq: Three times a day (TID) | INTRAMUSCULAR | Status: DC | PRN

## 2021-03-12 MED ORDER — ONDANSETRON HCL 4 MG/2ML IJ SOLN: 4.0000 mg | Freq: Four times a day (QID) | INTRAMUSCULAR | Status: DC | PRN

## 2021-03-12 MED ORDER — GABAPENTIN 600 MG PO TABS
300.0000 mg | ORAL_TABLET | Freq: Every day | ORAL | Status: DC
  Administered 2021-03-12 – 2021-03-14 (×4): 300 mg via ORAL
  Filled 2021-03-12 (×4): qty 1

## 2021-03-12 MED ORDER — HYOSCYAMINE SULFATE 0.125 MG PO TABS: 0.1250 mg | ORAL_TABLET | Freq: Three times a day (TID) | ORAL | Status: DC | PRN

## 2021-03-12 MED ORDER — AMIODARONE HCL 200 MG PO TABS
200.0000 mg | ORAL_TABLET | Freq: Every day | ORAL | Status: DC
  Administered 2021-03-12 – 2021-03-14 (×3): 200 mg via ORAL
  Filled 2021-03-12 (×3): qty 1

## 2021-03-12 NOTE — Consult Note (Signed)
[name] for Infectious Disease    Date of Admission:  03/11/2021     Reason for Consult: sepsis    Referring Provider: Dyann Reilly   Lines:  Peripheral IV's  Abx: 9/14-c fidaxomycin         Assessment: Hx recurrent cdiff Sepsis ESRD right chest HD catheter; left UE avfistula (not currently used) P-afib on anticoagulation HFpEF Severe AS  75 yo female with multiple medical comorbidity/obesity, severe AS with HFpEF, recurrent cdiff, here with 2 days malaise, decreased appetite  Patient still on the last 2 days of fidaxomycin tx, when her sx occurs. Makes minimal urine. No classic uti sx but previous uti without such as well. No clear cdiff symptomatology. Initial wbc 25 but within 24 hours much improved to 19 without new ID intervention  Unclear reason for current presentation, but could have BSI, UTI, vs ?worsening cdiff  If this is infact cdiff, she would have failed a vanc taper/pulse and dificid, and this would be 3rd episode. In that case she would be a stool transplant candidate, which she'll need to be referred to [facility]  Would like to avoid systemic abx therapy without judicial approach, as she has had recurrent cdiff   Plan: Extend dificid course to 14 days  Send blood and urine culture Will decide on particular approach within the next 2 days once cultures and more labs available and we have a better idea of what her diarrhea frequency is  Discussed with primary team  I spent 60 minute reviewing data/chart, and coordinating care and >50% direct face to face time providing counseling/discussing diagnostics/treatment plan with patient      ------------------------------------------------ Principal Problem:   Recurrent Clostridioides difficile diarrhea Active Problems:   Essential hypertension, benign   Chronic diastolic CHF (congestive heart failure) (HCC)   AF (paroxysmal atrial fibrillation) ([place])   ESRD on hemodialysis ([place]) - on Tu, Th,  Sat   Anemia in chronic kidney disease   Aortic stenosis - severe. last echo 09-2020 showed Aortic valve mean gradient measures 56.0 mmHg    HPI: [name] is a 75 y.o. female multiple morbidities as above, recurrent cdiff, here with 2 days malaise   She was seen by Dr [name] on 9/09 for 2nd episode recurrent cdiff. Her first cdiff was 05/2020 treated with short vanc course; at that time pcr positive but toxin negative. She was on cephalexin for recurrent uti   In July 2022 has had diarrhea and repeat cdiff testing was suggestive and didn't do well with tapered vanc course. 9/09 seen by dr [name] who switched her to dificid  Also had received immodium  Initially improved. 10/day --> 2/day diarrhea; weak/malaise. 9/09 stool testing positive toxin/pcr.   Doing well until 2 days prior to admission when malaise/decreased appetite/generalized weakness kick in again. Stool ?more loose per her family members report but still <2/day  No n/v/abd pain No rash  No recent systemic abx  No uti sx No focal joint pain  On presentation wbc 25; afebrile Repeat wbc within 24 hours 35    Family History  Problem Relation Age of Onset   Hypertension Mother    Cancer Mother 57       breast   Dementia Mother    Hypertension Brother    Diabetes Brother    Heart disease Brother        before age 43   Cancer Father 77       pancreatic  Heart failure Paternal Grandmother    Bladder Cancer Maternal Grandfather     Social History   Tobacco Use   Smoking status: Never   Smokeless tobacco: Never  Vaping Use   Vaping Use: Never used  Substance Use Topics   Alcohol use: No    Alcohol/week: 0.0 standard drinks   Drug use: No    Allergies  Allergen Reactions   Astemizole Nausea And Vomiting   Fluorouracil Rash   Vicodin [Hydrocodone-Acetaminophen] Nausea And Vomiting   Chlorhexidine Rash    Sunburn    rash   Percocet [Oxycodone-Acetaminophen] Nausea And Vomiting    Review  of Systems: ROS All Other ROS was negative, except mentioned above   Past Medical History:  Diagnosis Date   Acute lower GI bleeding 02/26/2021   Arthritis of left knee    Atrial fibrillation with RVR ([place])    CAP (community acquired pneumonia) 07/02/2016   Cardiomyopathy ([place])    a. h/o LV dysfunction EF 20-25% in 2013 due to sepsis.>> improved to normal    Chest pain 11/20/2015   Chronic diastolic CHF (congestive heart failure) ([place])    10/ 2013 in setting of septic shock   Compartment syndrome ([place]) 12/08/3084   Complication of anesthesia    use a little anesthesia , per patient MD states she quit breathing (2016); hard to wake up   Dehydration    ESRD (end stage renal disease) ([place])    dialysis Tues, Thurs, Sat [address], sees dr deterding    Gastroenteritis due to norovirus    GERD (gastroesophageal reflux disease)    Glaucoma    both eyes   H/O hiatal hernia    a. CT 2017: large gastric hiatal hernia.   HCAP (healthcare-associated pneumonia) 11/21/2015   History of blood transfusion 04/13/2015   History of kidney stones    10/18/2019: per patient "has a couple currently one in each kidney"   History of nephrostomy 04/11/2015   currently inplace 04/28/2015  removed now   Hyperlipidemia    Hypertension    medication removed from regimen due to low blood pressure    Hypokalemia 08/23/2018   Hypoxemia    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   Iron deficiency    hx   Melena    Methicillin resistant Staphylococcus aureus infection, unspecified site 03/22/2018   Nephrolithiasis 2002, 2006   bilateral   PAF (paroxysmal atrial fibrillation) ([place])    a. 10/ 2013  in setting of Septic Shock //  b. recurrent during admit for pneumonia, L effusion >> placed on Amiodarone // Coumadin for anticoagulation   PNA (pneumonia) 05/10/2016   Post-operative pain    Primary localized osteoarthritis of right hip 10/22/2019   Rectal bleeding    S/P hemodialysis catheter insertion ([place])  04/11/2015   right anterior chest , only used once    Shortness of breath 11/06/2015   Sigmoid diverticulosis    UGI bleed 08/23/2018       Scheduled Meds:  amiodarone  200 mg Oral Daily   apixaban  5 mg Oral BID   fidaxomicin  200 mg Oral BID   gabapentin  300 mg Oral QHS   saccharomyces boulardii  250 mg Oral Daily   Continuous Infusions: PRN Meds:.acetaminophen **OR** acetaminophen, LORazepam, ondansetron **OR** ondansetron (ZOFRAN) IV   OBJECTIVE: Blood pressure (!) 131/41, pulse 80, temperature 97.6 F (36.4 C), temperature source Oral, resp. rate 18, height 5\' 3"  (1.6 m), weight 68 kg, last menstrual period 01/19/1992, SpO2 97 %.  Physical Exam General/constitutional: obese, chronically ill appearing, short sentence conversion, slow mentation HEENT: Normocephalic, PER, Conj Clear, EOMI, Oropharynx clear Neck supple CV: rrr no mrg Lungs: clear to auscultation, normal respiratory effort Abd: Soft, Nontender Ext: anasarca Skin: purpura bilateral UE; LUE palpable thrill from avf Neuro: generalized weakness; no tremor/myoclonus MSK: no peripheral joint swelling/tenderness/warmth; back spines nontender   Central line presence: right chest hd cath no purulence/erythema/tenderness    Lab Results Lab Results  Component Value Date   WBC 19.1 (H) 03/12/2021   HGB 8.8 (L) 03/12/2021   HCT 27.8 (L) 03/12/2021   MCV 96.5 03/12/2021   PLT 212 03/12/2021    Lab Results  Component Value Date   CREATININE 2.57 (H) 03/12/2021   BUN 17 03/12/2021   NA 137 03/12/2021   K 5.1 03/12/2021   CL 102 03/12/2021   CO2 28 03/12/2021    Lab Results  Component Value Date   ALT 12 03/12/2021   AST 16 03/12/2021   ALKPHOS 104 03/12/2021   BILITOT 1.2 03/12/2021      Microbiology: Recent Results (from the past 240 hour(s))  Resp Panel by RT-PCR (Flu A&B, Covid) Nasopharyngeal Swab     Status: None   Collection Time: 03/12/21  4:18 AM   Specimen: Nasopharyngeal Swab;  Nasopharyngeal(NP) swabs in vial transport medium  Result Value Ref Range Status   SARS Coronavirus 2 by RT PCR NEGATIVE NEGATIVE Final    Comment: (NOTE) SARS-CoV-2 target nucleic acids are NOT DETECTED.  The SARS-CoV-2 RNA is generally detectable in upper respiratory specimens during the acute phase of infection. The lowest concentration of SARS-CoV-2 viral copies this assay can detect is 138 copies/mL. A negative result does not preclude SARS-Cov-2 infection and should not be used as the sole basis for treatment or other patient management decisions. A negative result may occur with  improper specimen collection/handling, submission of specimen other than nasopharyngeal swab, presence of viral mutation(s) within the areas targeted by this assay, and inadequate number of viral copies(<138 copies/mL). A negative result must be combined with clinical observations, patient history, and epidemiological information. The expected result is Negative.  Fact Sheet for Patients:  EntrepreneurPulse.com.au  Fact Sheet for Healthcare Providers:  IncredibleEmployment.be  This test is no t yet approved or cleared by the [place] FDA and  has been authorized for detection and/or diagnosis of SARS-CoV-2 by FDA under an Emergency Use Authorization (EUA). This EUA will remain  in effect (meaning this test can be used) for the duration of the COVID-19 declaration under Section 564(b)(1) of the Act, 21 U.S.C.section 360bbb-3(b)(1), unless the authorization is terminated  or revoked sooner.       Influenza A by PCR NEGATIVE NEGATIVE Final   Influenza B by PCR NEGATIVE NEGATIVE Final    Comment: (NOTE) The Xpert Xpress SARS-CoV-2/FLU/RSV plus assay is intended as an aid in the diagnosis of influenza from Nasopharyngeal swab specimens and should not be used as a sole basis for treatment. Nasal washings and aspirates are unacceptable for Xpert Xpress  SARS-CoV-2/FLU/RSV testing.  Fact Sheet for Patients: EntrepreneurPulse.com.au  Fact Sheet for Healthcare Providers: IncredibleEmployment.be  This test is not yet approved or cleared by the [place] FDA and has been authorized for detection and/or diagnosis of SARS-CoV-2 by FDA under an Emergency Use Authorization (EUA). This EUA will remain in effect (meaning this test can be used) for the duration of the COVID-19 declaration under Section 564(b)(1) of the Act, 21 U.S.C. section 360bbb-3(b)(1), unless the authorization  is terminated or revoked.  Performed at [facility], [place] [address] [place], [place] [address]      Serology:    Imaging: If present, new imagings (plain films, ct scans, and mri) have been personally visualized and interpreted; radiology reports have been reviewed. Decision making incorporated into the Impression / Recommendations.    Jabier Mutton, [facility] [place] [phone] pager    03/12/2021, 2:43 PM

## 2021-03-12 NOTE — Progress Notes (Signed)
PROGRESS NOTE    [name]  MHD:[id number] DOB: 03-Jan-1946 DOA: 03/11/2021 PCP: Lajean Manes, MD (Confirm with patient/family/NH records and if not entered, this HAS to be entered at [facility] point of entry. "No PCP" if truly none.)   Chief Complaint  Patient presents with   Weakness    Brief admission narrative:  As per H&P written by Dr. Bridgett Larsson on 03/11/2021 75 year old female with a history of end-stage renal disease on hemodialysis on Tuesday Thursday Saturday, recurrent C. difficile diarrhea, hypertension, chronic diastolic heart failure, severe aortic stenosis, anemia of chronic kidney disease who presents to the ER today with 2 to 3 days of worsening diarrhea.  Patient had hemodialysis today.  She came home and felt very weak.  Daughter is at the bedside.  Patient lives with her daughter.  Patient recently discharged in the middle of September 2022 after a long hospital stay for recurrent C. difficile diarrhea.  She was discharged on 10 days of p.o. Dificid.  Daughter states that tomorrow will be her last day of Dificid.  She is scheduled to see [facility] tomorrow in the office.  Daughter states over the last 2 to 3 days, the patient's diarrhea has gotten worse.  She is having several bouts of diarrhea per day.  Its not extremely watery like it was before but the daughter states that the diarrhea is becoming looser.  Patient has not had any fevers but she is having increasing abdominal cramping.   Patient states that she felt so weak after dialysis today that she could not sit up.   In the ER, patient noted to be afebrile.  Blood pressure was borderline at 93/48.  She did receive 1 fluid bolus.   Laboratory evaluation was concerning for an elevated white cell count of 24.5.  When she was discharged from the hospital this month, her discharge WBC was 10.9.  No imaging was performed today.  Lactic acid was normal at 1.9.  Assessment & Plan: 1-SIRS and concerns for Recurrent  Clostridioides difficile diarrhea -case discussed with ID, will continue dificid therapy and extend for 14 days (4 more days currently) -check UA and rule out UTI -follow blood culture  -continue clinical response.  2-Essential hypertension, benign -stable currently -will follow VS -continue current regimen and HD management.  3-Chronic diastolic CHF (congestive heart failure) (HCC) -compensated -follow daily weight -continue volume management with HD  4-AF (paroxysmal atrial fibrillation) (HCC) -rate controlled -continue eliquis and amiodarone -continue telemetry monitoring   5-anxiety/insomnia -will continue PRN ativan and PRN restoril at night time  6-ESRD on hemodialysis [facility]) - on Tu, Th, Sat -Nephrology service has been consulted for continuation of dialysis while inpatient. -Will follow recommendations.  7-Anemia in chronic kidney disease -No signs of overt bleeding -Continue to follow hemoglobin trend -IV iron and Epogen therapy as per nephrology's discretion.  8-coccyx pressure injury -barrier therapy, constant repositioning and the use of overlay mattress -PRN analgesics to be provided.   DVT prophylaxis: Chronically on Eliquis. Code Status: Full code Family Communication: Daughter at bedside Disposition:   Status is: Inpatient  Not inpatient appropriate, will call UM team and downgrade to OBS.   Dispo: The patient is from: Home              Anticipated d/c is to: Home              Patient currently is not medically stable to d/c.   Difficult to place patient No  Consultants:  Infectious disease Renal service  Procedures:  See below for x-ray reports  Antimicrobials:  Receiving Dificid   Subjective: Uncomfortable, complaining of abdominal cramping, intermittent nausea and loose stools.  Currently afebrile.  No chest pain, no shortness of breath, no vomiting.  Objective: Vitals:   03/12/21 1100 03/12/21 1200 03/12/21 1400 03/12/21  1500  BP: (!) 125/52 (!) 131/41  (!) 97/55  Pulse: 81 81 80 100  Resp: 17  18 17   Temp:    98 F (36.7 C)  TempSrc:    Oral  SpO2: 97% 94% 97% 97%  Weight:      Height:        Intake/Output Summary (Last 24 hours) at 03/12/2021 1644 Last data filed at 03/11/2021 2123 Gross per 24 hour  Intake 500 ml  Output --  Net 500 ml   Filed Weights   03/11/21 1727  Weight: 68 kg    Examination:  General exam: Appears uncomfortable, complaining of some intermittent nausea and abdominal cramping.  Family report multiple loose stools prior to admission.  No fever, no chest pain, no shortness of breath. Respiratory system: Good air movement bilaterally; no using accessory muscle. Cardiovascular system: S1 & S2 heard, RRR. No JVD, murmurs, rubs, gallops or clicks. No lower extremity edema on exam. Gastrointestinal system: Abdomen is nondistended, soft and vaguely tender to deep palpation.  Positive bowel sounds. Central nervous system: Alert and oriented. No focal neurological deficits. Extremities: No cyanosis or clubbing. Skin: Noticed erythematous changes/bruises appreciated on her upper extremities bilaterally.  Patient also with pressure injury in the coccyx pressure dermatitis, stage II without signs of superimposed infection.  Present on admission. Psychiatry: Judgement and insight appear normal.  Flat affect appreciated on examination    Data Reviewed: I have personally reviewed following labs and imaging studies  CBC: Recent Labs  Lab 03/11/21 1725 03/12/21 0413  WBC 24.5* 19.1*  NEUTROABS 24.3* 18.2*  HGB 10.2* 8.8*  HCT 33.3* 27.8*  MCV 98.8 96.5  PLT 174 149    Basic Metabolic Panel: Recent Labs  Lab 03/11/21 2045 03/12/21 0413  NA 136 137  K 5.3* 5.1  CL 101 102  CO2 28 28  GLUCOSE 83 74  BUN 15 17  CREATININE 2.39* 2.57*  CALCIUM 7.8* 7.8*    GFR: Estimated Creatinine Clearance: 17.2 mL/min (A) (by C-G formula based on SCr of 2.57 mg/dL (H)).  Liver  Function Tests: Recent Labs  Lab 03/11/21 2045 03/12/21 0413  AST 39 16  ALT 17 12  ALKPHOS 119 104  BILITOT 1.1 1.2  PROT 3.9* 3.4*  ALBUMIN 1.6* 1.4*    Recent Results (from the past 240 hour(s))  Resp Panel by RT-PCR (Flu A&B, Covid) Nasopharyngeal Swab     Status: None   Collection Time: 03/12/21  4:18 AM   Specimen: Nasopharyngeal Swab; Nasopharyngeal(NP) swabs in vial transport medium  Result Value Ref Range Status   SARS Coronavirus 2 by RT PCR NEGATIVE NEGATIVE Final    Comment: (NOTE) SARS-CoV-2 target nucleic acids are NOT DETECTED.  The SARS-CoV-2 RNA is generally detectable in upper respiratory specimens during the acute phase of infection. The lowest concentration of SARS-CoV-2 viral copies this assay can detect is 138 copies/mL. A negative result does not preclude SARS-Cov-2 infection and should not be used as the sole basis for treatment or other patient management decisions. A negative result may occur with  improper specimen collection/handling, submission of specimen other than nasopharyngeal swab, presence of viral mutation(s) within  the areas targeted by this assay, and inadequate number of viral copies(<138 copies/mL). A negative result must be combined with clinical observations, patient history, and epidemiological information. The expected result is Negative.  Fact Sheet for Patients:  EntrepreneurPulse.com.au  Fact Sheet for Healthcare Providers:  IncredibleEmployment.be  This test is no t yet approved or cleared by the [place] FDA and  has been authorized for detection and/or diagnosis of SARS-CoV-2 by FDA under an Emergency Use Authorization (EUA). This EUA will remain  in effect (meaning this test can be used) for the duration of the COVID-19 declaration under Section 564(b)(1) of the Act, 21 U.S.C.section 360bbb-3(b)(1), unless the authorization is terminated  or revoked sooner.       Influenza A  by PCR NEGATIVE NEGATIVE Final   Influenza B by PCR NEGATIVE NEGATIVE Final    Comment: (NOTE) The Xpert Xpress SARS-CoV-2/FLU/RSV plus assay is intended as an aid in the diagnosis of influenza from Nasopharyngeal swab specimens and should not be used as a sole basis for treatment. Nasal washings and aspirates are unacceptable for Xpert Xpress SARS-CoV-2/FLU/RSV testing.  Fact Sheet for Patients: EntrepreneurPulse.com.au  Fact Sheet for Healthcare Providers: IncredibleEmployment.be  This test is not yet approved or cleared by the [place] FDA and has been authorized for detection and/or diagnosis of SARS-CoV-2 by FDA under an Emergency Use Authorization (EUA). This EUA will remain in effect (meaning this test can be used) for the duration of the COVID-19 declaration under Section 564(b)(1) of the Act, 21 U.S.C. section 360bbb-3(b)(1), unless the authorization is terminated or revoked.  Performed at [facility], [place] [address] [place], [place] [address]      Radiology Studies: No results found.   Scheduled Meds:  amiodarone  200 mg Oral Daily   apixaban  5 mg Oral BID   fidaxomicin  200 mg Oral BID   gabapentin  300 mg Oral QHS   saccharomyces boulardii  250 mg Oral Daily   Continuous Infusions:   LOS: 0 days    Time spent: 35 minutes    Barton Dubois, MD [facility]   To contact the attending provider between 7A-7P or the covering provider during after hours 7P-7A, please log into the web site [email or web address] and access using universal [place] password for that web site. If you do not have the password, please call the hospital operator.  03/12/2021, 4:44 PM

## 2021-03-12 NOTE — ED Notes (Addendum)
Stuck patient x2 unable to get blood hand is too  swollen fluid

## 2021-03-12 NOTE — Consult Note (Addendum)
[facility] Renal Consultation Note    Indication for Consultation:  Management of ESRD/hemodialysis; anemia, hypertension/volume and secondary hyperparathyroidism  OZD:[id number], Hal, MD  HPI: [name] is a 75 y.o. female with ESRD on HD TTS at [facility]. Her past medical history significant for HTN, HL, PAF, recurrent Cdif infections, UTI's, severe AS, chronic diast CHF (EF 60%), and anemia.  Patient presents to the ED for worsening diarrhea X 2-3 days and abdominal cramping. Patient was previously hospitalized 9/8-9/14 for recurrent cdiff and was discharged on 10-day course PO Dificid. Her last HD was on 9/22 where she received full treatment. Lab work includes WBC 24.5-now 19.1 K+ 5.1 BUN 17 SrCr 2.57 Troponin 56 Lactic Acid 1.9. Plan for HD 9/24 per patient's usual schedule.  Past Medical History:  Diagnosis Date   Acute lower GI bleeding 02/26/2021   Arthritis of left knee    Atrial fibrillation with RVR ([place])    CAP (community acquired pneumonia) 07/02/2016   Cardiomyopathy ([place])    a. h/o LV dysfunction EF 20-25% in 2013 due to sepsis.>> improved to normal    Chest pain 11/20/2015   Chronic diastolic CHF (congestive heart failure) ([place])    10/ 2013 in setting of septic shock   Compartment syndrome ([place]) 2/59/5638   Complication of anesthesia    use a little anesthesia , per patient MD states she quit breathing (2016); hard to wake up   Dehydration    ESRD (end stage renal disease) ([place])    dialysis Tues, Thurs, Sat [address], sees dr deterding    Gastroenteritis due to norovirus    GERD (gastroesophageal reflux disease)    Glaucoma    both eyes   H/O hiatal hernia    a. CT 2017: large gastric hiatal hernia.   HCAP (healthcare-associated pneumonia) 11/21/2015   History of blood transfusion 04/13/2015   History of kidney stones    10/18/2019: per patient "has a couple currently one in each kidney"   History of nephrostomy 04/11/2015    currently inplace 04/28/2015  removed now   Hyperlipidemia    Hypertension    medication removed from regimen due to low blood pressure    Hypokalemia 08/23/2018   Hypoxemia    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   Iron deficiency    hx   Melena    Methicillin resistant Staphylococcus aureus infection, unspecified site 03/22/2018   Nephrolithiasis 2002, 2006   bilateral   PAF (paroxysmal atrial fibrillation) ([place])    a. 10/ 2013  in setting of Septic Shock //  b. recurrent during admit for pneumonia, L effusion >> placed on Amiodarone // Coumadin for anticoagulation   PNA (pneumonia) 05/10/2016   Post-operative pain    Primary localized osteoarthritis of right hip 10/22/2019   Rectal bleeding    S/P hemodialysis catheter insertion ([place]) 04/11/2015   right anterior chest , only used once    Shortness of breath 11/06/2015   Sigmoid diverticulosis    UGI bleed 08/23/2018   Past Surgical History:  Procedure Laterality Date   AV FISTULA PLACEMENT Left 06/02/2015   Procedure: BRACHIOCEPHALIC ARTERIOVENOUS (AV) FISTULA CREATION ;  Surgeon: Conrad Hondah, MD;  Location: [place];  Service: Vascular;  Laterality: Left;   [place] Left 07/27/2015   Procedure: FIRST STAGE BASILIC VEIN TRANSPOSITION LEFT UPPER ARM;  Surgeon: Conrad [place], MD;  Location: [place];  Service: Vascular;  Laterality: Left;   [place] Left 09/2015  second phase   BASCILIC VEIN TRANSPOSITION Left 10/12/2015   Procedure: SECOND STAGE BASILIC VEIN TRANSPOSITION LEFT ARM;  Surgeon: Conrad Colfax, MD;  Location: [place];  Service: Vascular;  Laterality: Left;   BREAST BIOPSY Left 08/23/07   benign fibrocystic with duct ectasia   CARDIAC CATHETERIZATION  07-11-2012  dr Irish Lack   Abnormal stress test/   normal coronary arteries/  LVEDP  34mmHg   CARDIOVASCULAR STRESS TEST  06-26-2012  dr Irish Lack   marked ischemia in the basal anterior, mid anterior, apical inferior regions/  normal LVF, ef 63%    CATARACT EXTRACTION W/ INTRAOCULAR LENS  IMPLANT, BILATERAL     COLONOSCOPY WITH PROPOFOL N/A 10/17/2016   Procedure: COLONOSCOPY WITH PROPOFOL;  Surgeon: Garlan Fair, MD;  Location: [facility];  Service: Endoscopy;  Laterality: N/A;   CYSTOSCOPY W/ URETERAL STENT PLACEMENT  04/04/2012   Procedure: CYSTOSCOPY WITH RETROGRADE PYELOGRAM/URETERAL STENT PLACEMENT;  Surgeon: Ailene Rud, MD;  Location: [place];  Service: Urology;  Laterality: Left;   CYSTOSCOPY W/ URETERAL STENT PLACEMENT Bilateral 05/04/2015   Procedure: CYSTOSCOPY WITH BILATERAL RETROGRADE PYELOGRAM/ WITH INTERPRETATION, EXCHANGE OF RIGHT URETERAL STENT REPLACEMENT AND PLACEMENT LEFT URETERAL STENT PLACEMENT EXAMINATION OF VAGINA;  Surgeon: Carolan Clines, MD;  Location: [facility];  Service: Urology;  Laterality: Bilateral;   CYSTOSCOPY WITH STENT PLACEMENT Right 10/28/2014   Procedure: RIGHT URETERAL STENT PLACEMENT;  Surgeon: Irine Seal, MD;  Location: [facility];  Service: Urology;  Laterality: Right;   CYSTOSCOPY WITH STENT PLACEMENT Right 02/26/2015   Procedure: CYSTOSCOPY RETROGRADE PYELOGRAM WITH STENT PLACEMENT;  Surgeon: Cleon Gustin, MD;  Location: [facility];  Service: Urology;  Laterality: Right;   CYSTOSCOPY/RETROGRADE/URETEROSCOPY/STONE EXTRACTION WITH BASKET Right 11/21/2014   Procedure: CYSTOSCOPY/RIGHT RETROGRADE PYELOGRAM/RIGHT URETEROSCOPY/BASKET EXTRACTION/RIGHT PYELOSCOPY/LASER OF STONE/RIGHT DOUBLE J STENT;  Surgeon: Carolan Clines, MD;  Location: [place];  Service: Urology;  Laterality: Right;   ESOPHAGOGASTRODUODENOSCOPY (EGD) WITH PROPOFOL Left 08/25/2018   Procedure: ESOPHAGOGASTRODUODENOSCOPY (EGD) WITH PROPOFOL;  Surgeon: Ronald Lobo, MD;  Location: [place];  Service: Endoscopy;  Laterality: Left;   EXTRACORPOREAL SHOCK WAVE LITHOTRIPSY  05-28-2012  &  10-08-2012   HEMATOMA EVACUATION Left 10/27/2020   Procedure: EVACUATION HEMATOMA, repair of  Pseudoaneurysm;  Surgeon: Waynetta Sandy, MD;  Location: [place];  Service: Vascular;  Laterality: Left;   HIP ARTHROPLASTY Left 06/09/2020   Procedure: ARTHROPLASTY BIPOLAR HIP (HEMIARTHROPLASTY);  Surgeon: Marchia Bond, MD;  Location: [place];  Service: Orthopedics;  Laterality: Left;   HOLMIUM LASER APPLICATION Right 09/24/8293   Procedure: HOLMIUM LASER APPLICATION;  Surgeon: Carolan Clines, MD;  Location: [facility];  Service: Urology;  Laterality: Right;   IR GENERIC HISTORICAL  02/01/2016   IR NEPHROSTOMY EXCHANGE RIGHT 02/01/2016 Greggory Keen, MD [facility]   IR GENERIC HISTORICAL  02/24/2016   IR PATIENT EVAL TECH 0-60 MINS 02/24/2016 Aletta Edouard, MD [facility]   KNEE ARTHROSCOPY Left 02-14-2003   LAPAROSCOPIC CHOLECYSTECTOMY  03-23-2005   TOTAL ABDOMINAL HYSTERECTOMY W/ BILATERAL SALPINGOOPHORECTOMY  1993   secondary to fibroids   TOTAL KNEE ARTHROPLASTY Left 10/22/2019   TOTAL KNEE ARTHROPLASTY Left 10/22/2019   Procedure: TOTAL KNEE ARTHROPLASTY;  Surgeon: Marchia Bond, MD;  Location: [place];  Service: Orthopedics;  Laterality: Left;   TRANSTHORACIC ECHOCARDIOGRAM  04-09-2012   normal LVF,  ef 60-65%,  mild LAE,  mild TR, trivial MR and PR   Family History  Problem Relation Age of Onset   Hypertension Mother  Cancer Mother 81       breast   Dementia Mother    Hypertension Brother    Diabetes Brother    Heart disease Brother        before age 38   Cancer Father 42       pancreatic   Heart failure Paternal Grandmother    Bladder Cancer Maternal Grandfather    Social History:  reports that she has never smoked. She has never used smokeless tobacco. She reports that she does not drink alcohol and does not use drugs. Allergies  Allergen Reactions   Astemizole Nausea And Vomiting   Fluorouracil Rash   Vicodin [Hydrocodone-Acetaminophen] Nausea And Vomiting   Chlorhexidine Rash    Sunburn    rash   Percocet [Oxycodone-Acetaminophen]  Nausea And Vomiting   Prior to Admission medications   Medication Sig Start Date End Date Taking? Authorizing Provider  amiodarone (PACERONE) 200 MG tablet Take 1 tablet (200 mg total) by mouth daily. 08/24/20  Yes Jettie Booze, MD  apixaban (ELIQUIS) 5 MG TABS tablet Take 1 tablet (5 mg total) by mouth 2 (two) times daily. 01/09/21  Yes Dwyane Dee, MD  bimatoprost (LUMIGAN) 0.01 % SOLN Place 1 drop into both eyes at bedtime.   Yes [provider]  fidaxomicin (DIFICID) 200 MG TABS tablet Take 1 tablet (200 mg total) by mouth 2 (two) times daily. 03/03/21  Yes Thurnell Lose, MD  gabapentin (NEURONTIN) 600 MG tablet Take 0.5 tablets (300 mg total) by mouth at bedtime. 12/02/20  Yes Love, Ivan Anchors, PA-C  heparin 1000 unit/mL SOLN injection Heparin Sodium (Porcine) 1,000 Units/mL Catheter Lock Arterial 10/24/20 10/23/21 Yes [provider]  LORazepam (ATIVAN) 1 MG tablet Take 1 mg by mouth daily as needed for anxiety.   Yes [provider]  multivitamin (RENA-VIT) TABS tablet Take 1 tablet by mouth at bedtime. 07/27/19  Yes [provider]  ondansetron (ZOFRAN) 4 MG tablet Take 1 tablet (4 mg total) by mouth every 8 (eight) hours as needed for nausea or vomiting. 03/03/21  Yes Thurnell Lose, MD  Potassium Chloride ER 20 MEQ TBCR Take 20 mEq by mouth daily. 02/13/21  Yes [provider]  saccharomyces boulardii (FLORASTOR) 250 MG capsule Take 1 capsule (250 mg total) by mouth 2 (two) times daily. Patient taking differently: Take 250 mg by mouth daily. 07/08/15  Yes Donne Hazel, MD  timolol (BETIMOL) 0.25 % ophthalmic solution Place 1 drop into both eyes 2 (two) times daily.   Yes [provider]   Current Facility-Administered Medications  Medication Dose Route Frequency Provider Last Rate Last Admin   acetaminophen (TYLENOL) tablet 650 mg  650 mg Oral Q6H PRN Kristopher Oppenheim, DO   650 mg at 03/12/21 3559   Or   acetaminophen (TYLENOL)  suppository 650 mg  650 mg Rectal Q6H PRN Kristopher Oppenheim, DO       amiodarone (PACERONE) tablet 200 mg  200 mg Oral Daily Kristopher Oppenheim, DO   200 mg at 03/12/21 1058   apixaban (ELIQUIS) tablet 5 mg  5 mg Oral BID Kristopher Oppenheim, DO   5 mg at 03/12/21 1051   fidaxomicin (DIFICID) tablet 200 mg  200 mg Oral BID Vu, Trung T, MD   200 mg at 03/12/21 1052   gabapentin (NEURONTIN) tablet 300 mg  300 mg Oral QHS Kristopher Oppenheim, DO   300 mg at 03/12/21 1051   LORazepam (ATIVAN) tablet 0.5 mg  0.5 mg Oral  Daily PRN Godfrey Pick, MD   0.5 mg at 03/12/21 1051   ondansetron (ZOFRAN) tablet 4 mg  4 mg Oral Q6H PRN Kristopher Oppenheim, DO   4 mg at 03/12/21 1751   Or   ondansetron (ZOFRAN) injection 4 mg  4 mg Intravenous Q6H PRN Kristopher Oppenheim, DO       saccharomyces boulardii (FLORASTOR) capsule 250 mg  250 mg Oral Daily Barton Dubois, MD   250 mg at 03/12/21 1051   Current Outpatient Medications  Medication Sig Dispense Refill   amiodarone (PACERONE) 200 MG tablet Take 1 tablet (200 mg total) by mouth daily. 90 tablet 3   apixaban (ELIQUIS) 5 MG TABS tablet Take 1 tablet (5 mg total) by mouth 2 (two) times daily. 60 tablet 3   bimatoprost (LUMIGAN) 0.01 % SOLN Place 1 drop into both eyes at bedtime.     fidaxomicin (DIFICID) 200 MG TABS tablet Take 1 tablet (200 mg total) by mouth 2 (two) times daily. 20 tablet 0   gabapentin (NEURONTIN) 600 MG tablet Take 0.5 tablets (300 mg total) by mouth at bedtime. 15 tablet 0   heparin 1000 unit/mL SOLN injection Heparin Sodium (Porcine) 1,000 Units/mL Catheter Lock Arterial     LORazepam (ATIVAN) 1 MG tablet Take 1 mg by mouth daily as needed for anxiety.     multivitamin (RENA-VIT) TABS tablet Take 1 tablet by mouth at bedtime.     ondansetron (ZOFRAN) 4 MG tablet Take 1 tablet (4 mg total) by mouth every 8 (eight) hours as needed for nausea or vomiting. 20 tablet 0   Potassium Chloride ER 20 MEQ TBCR Take 20 mEq by mouth daily.     saccharomyces boulardii (FLORASTOR) 250 MG capsule Take 1  capsule (250 mg total) by mouth 2 (two) times daily. (Patient taking differently: Take 250 mg by mouth daily.) 60 capsule 0   timolol (BETIMOL) 0.25 % ophthalmic solution Place 1 drop into both eyes 2 (two) times daily.     Labs: Basic Metabolic Panel: Recent Labs  Lab 03/11/21 2045 03/12/21 0413  NA 136 137  K 5.3* 5.1  CL 101 102  CO2 28 28  GLUCOSE 83 74  BUN 15 17  CREATININE 2.39* 2.57*  CALCIUM 7.8* 7.8*   Liver Function Tests: Recent Labs  Lab 03/11/21 2045 03/12/21 0413  AST 39 16  ALT 17 12  ALKPHOS 119 104  BILITOT 1.1 1.2  PROT 3.9* 3.4*  ALBUMIN 1.6* 1.4*   No results for input(s): LIPASE, AMYLASE in the last 168 hours. No results for input(s): AMMONIA in the last 168 hours. CBC: Recent Labs  Lab 03/11/21 1725 03/12/21 0413  WBC 24.5* 19.1*  NEUTROABS 24.3* 18.2*  HGB 10.2* 8.8*  HCT 33.3* 27.8*  MCV 98.8 96.5  PLT 174 212   Cardiac Enzymes: No results for input(s): CKTOTAL, CKMB, CKMBINDEX, TROPONINI in the last 168 hours. CBG: No results for input(s): GLUCAP in the last 168 hours. Iron Studies: No results for input(s): IRON, TIBC, TRANSFERRIN, FERRITIN in the last 72 hours. Studies/Results: No results found.  Physical Exam: Vitals:   03/12/21 1100 03/12/21 1200 03/12/21 1400 03/12/21 1500  BP: (!) 125/52 (!) 131/41  (!) 97/55  Pulse: 81 81 80 100  Resp: 17  18 17   Temp:    98 F (36.7 C)  TempSrc:    Oral  SpO2: 97% 94% 97% 97%  Weight:      Height:         General:  Chronically ill-appearing; NAD Lungs: CTA bilaterally. No wheeze, rales or rhonchi. Breathing is unlabored. Heart: + 3/6 systolic murmur.  Abdomen: soft, nontender, active bowel sounds Lower extremities: 2+ edema bilateral upper and lower extremities  Neuro: AAOx3. Moves all extremities spontaneously. Dialysis Access: R TDC, LU AVF (+) Bruit/Thrill Skin: Bruising noted BLUE   Dialysis Orders:  [facility] TTS 4h  400/500  68.5kg   2/2 bath P2  AVF / TDC No  Heparin Mircera 150 q2, last 03/11/21   Assessment/Plan: 1. Recurrent C-diff diarrhea- Worsening diarrhea over past 2-3 days. +Leukocytosis. ID and GI consulted. Continue Dificid. 2. ESRD: On HD TTS. Plan for HD 9/24-had episodes of hypotension with HD during last hospitalization. Continue to monitor trends. IV Albumin PRN if indicated. 3.Hypertension/volume: Noted 2+ edema BL upper and lower extremities. Not on antihypertensives at home. Albumin very low.  Continue to UF and lower dry as tolerated.  Standing weights if possible.  4 Anemia of ESRD: Hgb 8.8-ESA last given on 9/22 in outpatient, not due yet. 5 Metabolic bone disease: Corrected Ca ok, will check PO4 in AM. Not on binders or VDRA. 6 Nutrition- renal diet with fluid restrictions, will order protein supplements for low albumin. 7 Hx of ESBL E.coli UTI - and other pathogens.  8 Recent L arm compartment syndrome - sp AVF revision / repair.  Currently refusing to use due to ongoing swelling around AVF. 9 Chronic combined CHF 10 Paroxysmal A-Fib - takes Eliquis/amiodarone. 11 Severe aortic stenosis   Tobie Poet, NP [facility] 03/12/2021, 4:23 PM

## 2021-03-13 DIAGNOSIS — G9340 Encephalopathy, unspecified: Secondary | ICD-10-CM

## 2021-03-13 DIAGNOSIS — A0471 Enterocolitis due to Clostridium difficile, recurrent: Secondary | ICD-10-CM | POA: Diagnosis not present

## 2021-03-13 DIAGNOSIS — A419 Sepsis, unspecified organism: Secondary | ICD-10-CM

## 2021-03-13 DIAGNOSIS — L8990 Pressure ulcer of unspecified site, unspecified stage: Secondary | ICD-10-CM | POA: Insufficient documentation

## 2021-03-13 LAB — CBC
HCT: 31.3 % — ABNORMAL LOW (ref 36.0–46.0)
Hemoglobin: 9.9 g/dL — ABNORMAL LOW (ref 12.0–15.0)
MCH: 30 pg (ref 26.0–34.0)
MCHC: 31.6 g/dL (ref 30.0–36.0)
MCV: 94.8 fL (ref 80.0–100.0)
Platelets: 251 10*3/uL (ref 150–400)
RBC: 3.3 MIL/uL — ABNORMAL LOW (ref 3.87–5.11)
RDW: 23.9 % — ABNORMAL HIGH (ref 11.5–15.5)
WBC: 29.3 10*3/uL — ABNORMAL HIGH (ref 4.0–10.5)
nRBC: 0.1 % (ref 0.0–0.2)

## 2021-03-13 LAB — HEPATITIS B SURFACE ANTIBODY,QUALITATIVE: Hep B S Ab: REACTIVE — AB

## 2021-03-13 LAB — HEPATITIS B SURFACE ANTIGEN: Hepatitis B Surface Ag: NONREACTIVE

## 2021-03-13 MED ORDER — SODIUM CHLORIDE 0.9 % IV SOLN
1.0000 g | INTRAVENOUS | Status: DC
  Administered 2021-03-14 (×2): 1 g via INTRAVENOUS
  Filled 2021-03-13 (×2): qty 10

## 2021-03-13 MED ORDER — ALBUMIN HUMAN 25 % IV SOLN
INTRAVENOUS | Status: AC
  Administered 2021-03-13: 25 g
  Filled 2021-03-13: qty 100

## 2021-03-13 NOTE — Plan of Care (Signed)
°  Problem: Elimination: °Goal: Will not experience complications related to bowel motility °Outcome: Progressing °  °Problem: Pain Managment: °Goal: General experience of comfort will improve °Outcome: Progressing °  °

## 2021-03-13 NOTE — Progress Notes (Signed)
PROGRESS NOTE    [name]  ZMO:[id number] DOB: August 28, 1945 DOA: 03/11/2021 PCP: Lajean Manes, MD    Brief Narrative:  75 year old female with history of ESRD on hemodialysis TTS schedule, recurrent C. difficile, hypertension, chronic diastolic heart failure, severe aortic stenosis, chronic debility and wheelchair-bound presented to the ER with 2 to 3 days of worsening diarrhea.  Lives at home with her daughter.  This has been at least fourth episode of C. difficile.  In the ER, white count 24.5.   Assessment & Plan:   Principal Problem:   Recurrent Clostridioides difficile diarrhea Active Problems:   Essential hypertension, benign   Chronic diastolic CHF (congestive heart failure) (HCC)   AF (paroxysmal atrial fibrillation) (HCC)   ESRD on hemodialysis ([place]) - on Tu, Th, Sat   Anemia in chronic kidney disease   Aortic stenosis - severe. last echo 09-2020 showed Aortic valve mean gradient measures 56.0 mmHg   Pressure injury of skin  SIRS, recurrent C. difficile diarrhea: WBC count fluctuating, 29,000 today.  Currently on tapering dose of Dificid, therapy extended. Straight cath and UA to rule out UTI. ID following.  She may benefit with further advanced therapy for C. difficile treatment.  ESRD on hemodialysis: Receiving dialysis on a schedule.  Today on dialysis.  Essential hypertension Home blood pressures currently stable.  Managed with dialysis.  Paroxysmal A. fib: Rate controlled.  Therapeutic on Eliquis.  Sinus rhythm on amiodarone.  Chronic diastolic congestive heart failure: Euvolemic.  Managed with dialysis.  Anemia of chronic disease: Is stable.   Pressure Injury 03/12/21 Sacrum Deep Tissue Pressure Injury - Purple or maroon localized area of discolored intact skin or blood-filled blister due to damage of underlying soft tissue from pressure and/or shear. (Active)  03/12/21 2000  Location: Sacrum  Location Orientation:   Staging: Deep Tissue Pressure Injury  - Purple or maroon localized area of discolored intact skin or blood-filled blister due to damage of underlying soft tissue from pressure and/or shear.  Wound Description (Comments):   Present on Admission: Yes     Pressure Injury 03/12/21 Buttocks Left Stage 2 -  Partial thickness loss of dermis presenting as a shallow open injury with a red, pink wound bed without slough. (Active)  03/12/21 2000  Location: Buttocks  Location Orientation: Left  Staging: Stage 2 -  Partial thickness loss of dermis presenting as a shallow open injury with a red, pink wound bed without slough.  Wound Description (Comments):   Present on Admission: No     Pressure Injury 03/12/21 Buttocks Right Stage 2 -  Partial thickness loss of dermis presenting as a shallow open injury with a red, pink wound bed without slough. (Active)  03/12/21 2000  Location: Buttocks  Location Orientation: Right  Staging: Stage 2 -  Partial thickness loss of dermis presenting as a shallow open injury with a red, pink wound bed without slough.  Wound Description (Comments):   Present on Admission: Yes          DVT prophylaxis: SCDs Start: 03/12/21 0122 apixaban (ELIQUIS) tablet 5 mg   Code Status: Full code Family Communication: None Disposition Plan: Status is: Inpatient  Remains inpatient appropriate because:Inpatient level of care appropriate due to severity of illness  Dispo: The patient is from: Home              Anticipated d/c is to: Home              Patient currently is not medically stable to  d/c.   Difficult to place patient No         Consultants:  Nephrology Infectious disease  Procedures:  Routine hemodialysis  Antimicrobials:  Dificid--- ongoing from outpatient   Subjective: Patient seen and examined in the morning rounds.  She remains pretty lethargic and sleepy.  Does not respond much.  Denies any abdomen pain or nausea.  Nursing reported 1 large formed stool  overnight.  Objective: Vitals:   03/13/21 1430 03/13/21 1442 03/13/21 1532 03/13/21 1535  BP: (!) 108/55 (!) 109/50 (!) 114/46   Pulse: 86 95 (!) 102 84  Resp:  (!) 22 16   Temp:  98.2 F (36.8 C) 97.9 F (36.6 C)   TempSrc:  Oral Oral   SpO2:  98%  98%  Weight:      Height:        Intake/Output Summary (Last 24 hours) at 03/13/2021 1544 Last data filed at 03/13/2021 1500 Gross per 24 hour  Intake 420 ml  Output 1500 ml  Net -1080 ml   Filed Weights   03/11/21 1727 03/12/21 2014 03/13/21 1200  Weight: 68 kg 71.2 kg 105.2 kg    Examination:  General exam: Frail.  Debilitated.  Chronically sick looking.  Not in any distress. Respiratory system: No added sounds. Cardiovascular system: S1 & S2 heard, RRR.  Gastrointestinal system: Soft.  Nontender.  Bowel sounds present. Central nervous system: Alert and oriented.  Sleepy and lethargic.  Chronically sick looking.  Very frail and debilitated. Left upper extremity AV fistula with thrill present. Multiple ecchymotic area in the skin.    Data Reviewed: I have personally reviewed following labs and imaging studies  CBC: Recent Labs  Lab 03/11/21 1725 03/12/21 0413 03/13/21 1217  WBC 24.5* 19.1* 29.3*  NEUTROABS 24.3* 18.2*  --   HGB 10.2* 8.8* 9.9*  HCT 33.3* 27.8* 31.3*  MCV 98.8 96.5 94.8  PLT 174 212 893   Basic Metabolic Panel: Recent Labs  Lab 03/11/21 2045 03/12/21 0413  NA 136 137  K 5.3* 5.1  CL 101 102  CO2 28 28  GLUCOSE 83 74  BUN 15 17  CREATININE 2.39* 2.57*  CALCIUM 7.8* 7.8*   GFR: Estimated Creatinine Clearance: 21.6 mL/min (A) (by C-G formula based on SCr of 2.57 mg/dL (H)). Liver Function Tests: Recent Labs  Lab 03/11/21 2045 03/12/21 0413  AST 39 16  ALT 17 12  ALKPHOS 119 104  BILITOT 1.1 1.2  PROT 3.9* 3.4*  ALBUMIN 1.6* 1.4*   No results for input(s): LIPASE, AMYLASE in the last 168 hours. No results for input(s): AMMONIA in the last 168 hours. Coagulation Profile: No  results for input(s): INR, PROTIME in the last 168 hours. Cardiac Enzymes: No results for input(s): CKTOTAL, CKMB, CKMBINDEX, TROPONINI in the last 168 hours. BNP (last 3 results) No results for input(s): PROBNP in the last 8760 hours. HbA1C: No results for input(s): HGBA1C in the last 72 hours. CBG: No results for input(s): GLUCAP in the last 168 hours. Lipid Profile: No results for input(s): CHOL, HDL, LDLCALC, TRIG, CHOLHDL, LDLDIRECT in the last 72 hours. Thyroid Function Tests: Recent Labs    03/11/21 1725  TSH 2.690   Anemia Panel: No results for input(s): VITAMINB12, FOLATE, FERRITIN, TIBC, IRON, RETICCTPCT in the last 72 hours. Sepsis Labs: Recent Labs  Lab 03/11/21 2048  LATICACIDVEN 1.9    Recent Results (from the past 240 hour(s))  Blood culture (routine x 2)     Status: None (Preliminary result)  Collection Time: 03/11/21 10:20 PM   Specimen: BLOOD  Result Value Ref Range Status   Specimen Description BLOOD BLOOD RIGHT ARM  Final   Special Requests   Final    BOTTLES DRAWN AEROBIC AND ANAEROBIC Blood Culture results may not be optimal due to an inadequate volume of blood received in culture bottles   Culture   Final    NO GROWTH 2 DAYS Performed at [facility], [place] [address], [place], [place] [address]    Report Status PENDING  Incomplete  Resp Panel by RT-PCR (Flu A&B, Covid) Nasopharyngeal Swab     Status: None   Collection Time: 03/12/21  4:18 AM   Specimen: Nasopharyngeal Swab; Nasopharyngeal(NP) swabs in vial transport medium  Result Value Ref Range Status   SARS Coronavirus 2 by RT PCR NEGATIVE NEGATIVE Final    Comment: (NOTE) SARS-CoV-2 target nucleic acids are NOT DETECTED.  The SARS-CoV-2 RNA is generally detectable in upper respiratory specimens during the acute phase of infection. The lowest concentration of SARS-CoV-2 viral copies this assay can detect is 138 copies/mL. A negative result does not preclude SARS-Cov-2 infection and  should not be used as the sole basis for treatment or other patient management decisions. A negative result may occur with  improper specimen collection/handling, submission of specimen other than nasopharyngeal swab, presence of viral mutation(s) within the areas targeted by this assay, and inadequate number of viral copies(<138 copies/mL). A negative result must be combined with clinical observations, patient history, and epidemiological information. The expected result is Negative.  Fact Sheet for Patients:  EntrepreneurPulse.com.au  Fact Sheet for Healthcare Providers:  IncredibleEmployment.be  This test is no t yet approved or cleared by the [place] FDA and  has been authorized for detection and/or diagnosis of SARS-CoV-2 by FDA under an Emergency Use Authorization (EUA). This EUA will remain  in effect (meaning this test can be used) for the duration of the COVID-19 declaration under Section 564(b)(1) of the Act, 21 U.S.C.section 360bbb-3(b)(1), unless the authorization is terminated  or revoked sooner.       Influenza A by PCR NEGATIVE NEGATIVE Final   Influenza B by PCR NEGATIVE NEGATIVE Final    Comment: (NOTE) The Xpert Xpress SARS-CoV-2/FLU/RSV plus assay is intended as an aid in the diagnosis of influenza from Nasopharyngeal swab specimens and should not be used as a sole basis for treatment. Nasal washings and aspirates are unacceptable for Xpert Xpress SARS-CoV-2/FLU/RSV testing.  Fact Sheet for Patients: EntrepreneurPulse.com.au  Fact Sheet for Healthcare Providers: IncredibleEmployment.be  This test is not yet approved or cleared by the [place] FDA and has been authorized for detection and/or diagnosis of SARS-CoV-2 by FDA under an Emergency Use Authorization (EUA). This EUA will remain in effect (meaning this test can be used) for the duration of the COVID-19 declaration  under Section 564(b)(1) of the Act, 21 U.S.C. section 360bbb-3(b)(1), unless the authorization is terminated or revoked.  Performed at [facility], [place] [address], [place], [place] [address]          Radiology Studies: No results found.      Scheduled Meds:  amiodarone  200 mg Oral Daily   apixaban  5 mg Oral BID   fidaxomicin  200 mg Oral BID   gabapentin  300 mg Oral QHS   saccharomyces boulardii  250 mg Oral Daily   Continuous Infusions:   LOS: 1 day    Time spent: 25 minutes    Barb Merino,  MD [facility] Pager [phone]

## 2021-03-13 NOTE — Progress Notes (Signed)
Contacted provider per family request to call them [phone]. Family is bedside and concerned about what is being done.

## 2021-03-13 NOTE — Progress Notes (Signed)
[name] for Infectious Disease  Date of Admission:  03/11/2021      Lines: Right chest hd catheter  Abx: 9/14-c fidaxomycin  ASSESSMENT: Ams Leukocytosis Hx cdiff, recurrent  No obvious sign of cdiff Wbc up and down rather quickly No fever  Some cough today  If patient doesn't make urine, shouldn't have uti. Discussed with nursing to do bladder scan  Await bcx  Given stable dynamics, will await diagnostics before committing to empiric abx for infectious syndrome. ?cdiff, ?pna, ?bsi  PLAN: Continue dificid F/u bcx Chest xray given cough If blood pressure/hemodynamics drop, would add vanc/cefepime  Discussed with primary team   I spent more than 35 minute reviewing data/chart, and coordinating care and >50% direct face to face time providing counseling/discussing diagnostics/treatment plan with patient   Principal Problem:   Recurrent Clostridioides difficile diarrhea Active Problems:   Essential hypertension, benign   Chronic diastolic CHF (congestive heart failure) (HCC)   AF (paroxysmal atrial fibrillation) (HCC)   ESRD on hemodialysis ([place]) - on Tu, Th, Sat   Anemia in chronic kidney disease   Aortic stenosis - severe. last echo 09-2020 showed Aortic valve mean gradient measures 56.0 mmHg   Allergies  Allergen Reactions   Astemizole Nausea And Vomiting   Fluorouracil Rash   Vicodin [Hydrocodone-Acetaminophen] Nausea And Vomiting   Chlorhexidine Rash    Sunburn    rash   Percocet [Oxycodone-Acetaminophen] Nausea And Vomiting    Scheduled Meds:  amiodarone  200 mg Oral Daily   apixaban  5 mg Oral BID   fidaxomicin  200 mg Oral BID   gabapentin  300 mg Oral QHS   saccharomyces boulardii  250 mg Oral Daily   Continuous Infusions: PRN Meds:.acetaminophen **OR** acetaminophen, fentaNYL (SUBLIMAZE) injection, hyoscyamine, LORazepam, ondansetron **OR** ondansetron (ZOFRAN) IV, temazepam   SUBJECTIVE: Patient appears to me more  lethargic Discussed with nursing staff about getting UA, but patient doesn't make urine. Nursing staff confirms with daughter (who told me she makes some urine yesterday) today 9/24 who reports patient doesn't make urine Bcx in progress Afebrile  Patient denies n/v/abd pain Only 2 soft/formed stools last 24 hours  She is coughing while we were talking   Review of Systems: ROS All other ROS was negative, except mentioned above     OBJECTIVE: Vitals:   03/13/21 1430 03/13/21 1442 03/13/21 1532 03/13/21 1535  BP: (!) 108/55 (!) 109/50 (!) 114/46   Pulse: 86 95 (!) 102 84  Resp:  (!) 22 16   Temp:  98.2 F (36.8 C) 97.9 F (36.6 C)   TempSrc:  Oral Oral   SpO2:  98%  98%  Weight:      Height:       Body mass index is 41.1 kg/m.  Physical Exam General/constitutional: no distress, some conversation, appears lethargic; coughing during exam/history HEENT: Normocephalic, PER, Conj Clear, EOMI, Oropharynx clear Neck supple CV: rrr no mrg Lungs: clear to auscultation, normal respiratory effort Abd: Soft, Nontender Ext: edema on all ext stable Skin: purpuric changes in bilateral UE stable Neuro: generalized weakness; LE 3/5 symmetric; upper ext 4/5 symmetric MSK: no peripheral joint swelling/tenderness/warmth; back spines nontender   Central line presence: right chest hd line site no erythema/purulence    Lab Results Lab Results  Component Value Date   WBC 29.3 (H) 03/13/2021   HGB 9.9 (L) 03/13/2021   HCT 31.3 (L) 03/13/2021   MCV 94.8 03/13/2021   PLT 251 03/13/2021  Lab Results  Component Value Date   CREATININE 2.57 (H) 03/12/2021   BUN 17 03/12/2021   NA 137 03/12/2021   K 5.1 03/12/2021   CL 102 03/12/2021   CO2 28 03/12/2021    Lab Results  Component Value Date   ALT 12 03/12/2021   AST 16 03/12/2021   ALKPHOS 104 03/12/2021   BILITOT 1.2 03/12/2021      Microbiology: Recent Results (from the past 240 hour(s))  Blood culture (routine x 2)      Status: None (Preliminary result)   Collection Time: 03/11/21 10:20 PM   Specimen: BLOOD  Result Value Ref Range Status   Specimen Description BLOOD BLOOD RIGHT ARM  Final   Special Requests   Final    BOTTLES DRAWN AEROBIC AND ANAEROBIC Blood Culture results may not be optimal due to an inadequate volume of blood received in culture bottles   Culture   Final    NO GROWTH 2 DAYS Performed at [facility], [place] [address], [place], [place] [address]    Report Status PENDING  Incomplete  Resp Panel by RT-PCR (Flu A&B, Covid) Nasopharyngeal Swab     Status: None   Collection Time: 03/12/21  4:18 AM   Specimen: Nasopharyngeal Swab; Nasopharyngeal(NP) swabs in vial transport medium  Result Value Ref Range Status   SARS Coronavirus 2 by RT PCR NEGATIVE NEGATIVE Final    Comment: (NOTE) SARS-CoV-2 target nucleic acids are NOT DETECTED.  The SARS-CoV-2 RNA is generally detectable in upper respiratory specimens during the acute phase of infection. The lowest concentration of SARS-CoV-2 viral copies this assay can detect is 138 copies/mL. A negative result does not preclude SARS-Cov-2 infection and should not be used as the sole basis for treatment or other patient management decisions. A negative result may occur with  improper specimen collection/handling, submission of specimen other than nasopharyngeal swab, presence of viral mutation(s) within the areas targeted by this assay, and inadequate number of viral copies(<138 copies/mL). A negative result must be combined with clinical observations, patient history, and epidemiological information. The expected result is Negative.  Fact Sheet for Patients:  EntrepreneurPulse.com.au  Fact Sheet for Healthcare Providers:  IncredibleEmployment.be  This test is no t yet approved or cleared by the [place] FDA and  has been authorized for detection and/or diagnosis of SARS-CoV-2 by FDA under an  Emergency Use Authorization (EUA). This EUA will remain  in effect (meaning this test can be used) for the duration of the COVID-19 declaration under Section 564(b)(1) of the Act, 21 U.S.C.section 360bbb-3(b)(1), unless the authorization is terminated  or revoked sooner.       Influenza A by PCR NEGATIVE NEGATIVE Final   Influenza B by PCR NEGATIVE NEGATIVE Final    Comment: (NOTE) The Xpert Xpress SARS-CoV-2/FLU/RSV plus assay is intended as an aid in the diagnosis of influenza from Nasopharyngeal swab specimens and should not be used as a sole basis for treatment. Nasal washings and aspirates are unacceptable for Xpert Xpress SARS-CoV-2/FLU/RSV testing.  Fact Sheet for Patients: EntrepreneurPulse.com.au  Fact Sheet for Healthcare Providers: IncredibleEmployment.be  This test is not yet approved or cleared by the [place] FDA and has been authorized for detection and/or diagnosis of SARS-CoV-2 by FDA under an Emergency Use Authorization (EUA). This EUA will remain in effect (meaning this test can be used) for the duration of the COVID-19 declaration under Section 564(b)(1) of the Act, 21 U.S.C. section 360bbb-3(b)(1), unless the authorization is terminated or revoked.  Performed at [facility], [place] [address] [place], [place] [address]      Serology:   Imaging: If present, new imagings (plain films, ct scans, and mri) have been personally visualized and interpreted; radiology reports have been reviewed. Decision making incorporated into the Impression / Recommendations.   Jabier Mutton, [place] for Infectious Disease [place] [phone] pager    03/13/2021, 3:41 PM

## 2021-03-13 NOTE — Progress Notes (Signed)
[facility] Progress Note   Subjective:    Patient seen and examined at bedside. Patient's daughter also at bedside. Denies SOB, CP, ABD pain, and N/V. Plan for HD today.  Objective Vitals:   03/13/21 1430 03/13/21 1442 03/13/21 1532 03/13/21 1535  BP: (!) 108/55 (!) 109/50 (!) 114/46   Pulse: 86 95 (!) 102 84  Resp:  (!) 22 16   Temp:  98.2 F (36.8 C) 97.9 F (36.6 C)   TempSrc:  Oral Oral   SpO2:  98%  98%  Weight:      Height:       Physical Exam General: Chronically ill-appearing; NAD Lungs: CTA bilaterally. No wheeze, rales or rhonchi. Breathing is unlabored. Heart: + 3/6 systolic murmur.  Abdomen: soft, nontender, active bowel sounds Lower extremities: 2+ edema bilateral upper and lower extremities  Neuro: AAOx3. Moves all extremities spontaneously. Dialysis Access: R TDC, LU AVF (+) Bruit/Thrill Skin: Bruising noted BLUE   Filed Weights   03/11/21 1727 03/12/21 2014 03/13/21 1200  Weight: 68 kg 71.2 kg 105.2 kg    Intake/Output Summary (Last 24 hours) at 03/13/2021 1808 Last data filed at 03/13/2021 1600 Gross per 24 hour  Intake 420 ml  Output 1500 ml  Net -1080 ml    Additional Objective Labs: Basic Metabolic Panel: Recent Labs  Lab 03/11/21 2045 03/12/21 0413  NA 136 137  K 5.3* 5.1  CL 101 102  CO2 28 28  GLUCOSE 83 74  BUN 15 17  CREATININE 2.39* 2.57*  CALCIUM 7.8* 7.8*   Liver Function Tests: Recent Labs  Lab 03/11/21 2045 03/12/21 0413  AST 39 16  ALT 17 12  ALKPHOS 119 104  BILITOT 1.1 1.2  PROT 3.9* 3.4*  ALBUMIN 1.6* 1.4*   No results for input(s): LIPASE, AMYLASE in the last 168 hours. CBC: Recent Labs  Lab 03/11/21 1725 03/12/21 0413 03/13/21 1217  WBC 24.5* 19.1* 29.3*  NEUTROABS 24.3* 18.2*  --   HGB 10.2* 8.8* 9.9*  HCT 33.3* 27.8* 31.3*  MCV 98.8 96.5 94.8  PLT 174 212 251   Blood Culture    Component Value Date/Time   SDES BLOOD BLOOD RIGHT ARM 03/11/2021 2220   SPECREQUEST  03/11/2021  2220    BOTTLES DRAWN AEROBIC AND ANAEROBIC Blood Culture results may not be optimal due to an inadequate volume of blood received in culture bottles   CULT  03/11/2021 2220    NO GROWTH 2 DAYS Performed at [facility] [address], [place], [place] [address]    REPTSTATUS PENDING 03/11/2021 2220    Cardiac Enzymes: No results for input(s): CKTOTAL, CKMB, CKMBINDEX, TROPONINI in the last 168 hours. CBG: No results for input(s): GLUCAP in the last 168 hours. Iron Studies: No results for input(s): IRON, TIBC, TRANSFERRIN, FERRITIN in the last 72 hours. Lab Results  Component Value Date   INR 2.7 (H) 02/28/2021   INR 2.9 (H) 02/27/2021   INR 3.2 (H) 02/26/2021   Studies/Results: No results found.  Medications:  cefTRIAXone (ROCEPHIN)  IV      amiodarone  200 mg Oral Daily   apixaban  5 mg Oral BID   fidaxomicin  200 mg Oral BID   gabapentin  300 mg Oral QHS   saccharomyces boulardii  250 mg Oral Daily    Dialysis Orders: [facility] TTS 4h  400/500  68.5kg   2/2 bath P2  AVF / TDC No Heparin Mircera 150 q2, last 03/11/21   Assessment/Plan:  1. Recurrent C-diff diarrhea- Worsening diarrhea over past 2-3 days. +Leukocytosis. ID and GI consulted; awaiting blood cxs. Continue Dificid. 2. ESRD: On HD TTS. Plan for HD today-had episodes of hypotension with HD during last hospitalization. Continue to monitor trends. IV Albumin PRN if indicated. 3.Hypertension/volume: Noted 2+ edema BL upper and lower extremities. Not on antihypertensives at home. Albumin very low.  Continue to UF and lower dry as tolerated.  Standing weights if possible.  4 Anemia of ESRD: Hgb 9.9-ESA last given on 9/22 in outpatient, not due yet. 5 Metabolic bone disease: Corrected Ca ok, will check PO4 in AM. Not on binders or VDRA. 6 Nutrition- renal diet with fluid restrictions, will order protein supplements for low albumin. 7 Hx of ESBL E.coli UTI - and other pathogens.  8 Recent L arm compartment syndrome  - sp AVF revision / repair.  Currently refusing to use due to ongoing swelling around AVF. 9 Chronic combined CHF 10 Paroxysmal A-Fib - takes Eliquis/amiodarone. 11 Severe aortic stenosis   Tobie Poet, NP [facility] 03/13/2021,6:08 PM  LOS: 1 day

## 2021-03-14 ENCOUNTER — Inpatient Hospital Stay (HOSPITAL_COMMUNITY): Payer: Medicare Other

## 2021-03-14 DIAGNOSIS — N186 End stage renal disease: Secondary | ICD-10-CM | POA: Diagnosis not present

## 2021-03-14 DIAGNOSIS — A0471 Enterocolitis due to Clostridium difficile, recurrent: Secondary | ICD-10-CM | POA: Diagnosis not present

## 2021-03-14 DIAGNOSIS — Z7189 Other specified counseling: Secondary | ICD-10-CM

## 2021-03-14 DIAGNOSIS — I480 Paroxysmal atrial fibrillation: Secondary | ICD-10-CM | POA: Diagnosis not present

## 2021-03-14 DIAGNOSIS — D72829 Elevated white blood cell count, unspecified: Secondary | ICD-10-CM

## 2021-03-14 DIAGNOSIS — G9340 Encephalopathy, unspecified: Secondary | ICD-10-CM | POA: Diagnosis not present

## 2021-03-14 LAB — CBC WITH DIFFERENTIAL/PLATELET
Abs Immature Granulocytes: 0 10*3/uL (ref 0.00–0.07)
Basophils Absolute: 0 10*3/uL (ref 0.0–0.1)
Basophils Relative: 0 %
Eosinophils Absolute: 0 10*3/uL (ref 0.0–0.5)
Eosinophils Relative: 0 %
HCT: 30.3 % — ABNORMAL LOW (ref 36.0–46.0)
Hemoglobin: 9.4 g/dL — ABNORMAL LOW (ref 12.0–15.0)
Lymphocytes Relative: 0 %
Lymphs Abs: 0 10*3/uL — ABNORMAL LOW (ref 0.7–4.0)
MCH: 29.8 pg (ref 26.0–34.0)
MCHC: 31 g/dL (ref 30.0–36.0)
MCV: 96.2 fL (ref 80.0–100.0)
Monocytes Absolute: 0.3 10*3/uL (ref 0.1–1.0)
Monocytes Relative: 1 %
Neutro Abs: 29.6 10*3/uL — ABNORMAL HIGH (ref 1.7–7.7)
Neutrophils Relative %: 99 %
Platelets: 187 10*3/uL (ref 150–400)
RBC: 3.15 MIL/uL — ABNORMAL LOW (ref 3.87–5.11)
RDW: 24.6 % — ABNORMAL HIGH (ref 11.5–15.5)
WBC: 29.9 10*3/uL — ABNORMAL HIGH (ref 4.0–10.5)
nRBC: 0 /100 WBC
nRBC: 0.1 % (ref 0.0–0.2)

## 2021-03-14 LAB — BODY FLUID CELL COUNT WITH DIFFERENTIAL
Eos, Fluid: 0 %
Lymphs, Fluid: 18 %
Monocyte-Macrophage-Serous Fluid: 14 % — ABNORMAL LOW (ref 50–90)
Neutrophil Count, Fluid: 68 % — ABNORMAL HIGH (ref 0–25)
Total Nucleated Cell Count, Fluid: 123 cu mm (ref 0–1000)

## 2021-03-14 LAB — GLUCOSE, CAPILLARY
Glucose-Capillary: 87 mg/dL (ref 70–99)
Glucose-Capillary: 96 mg/dL (ref 70–99)
Glucose-Capillary: 76 mg/dL (ref 70–99)
Glucose-Capillary: 100 mg/dL — ABNORMAL HIGH (ref 70–99)
Glucose-Capillary: 82 mg/dL (ref 70–99)
Glucose-Capillary: 69 mg/dL — ABNORMAL LOW (ref 70–99)
Glucose-Capillary: 99 mg/dL (ref 70–99)
Glucose-Capillary: 127 mg/dL — ABNORMAL HIGH (ref 70–99)

## 2021-03-14 LAB — BASIC METABOLIC PANEL
Anion gap: 8 (ref 5–15)
BUN: 19 mg/dL (ref 8–23)
CO2: 25 mmol/L (ref 22–32)
Calcium: 7.7 mg/dL — ABNORMAL LOW (ref 8.9–10.3)
Chloride: 101 mmol/L (ref 98–111)
Creatinine, Ser: 2.86 mg/dL — ABNORMAL HIGH (ref 0.44–1.00)
GFR, Estimated: 17 mL/min — ABNORMAL LOW (ref >60)
Glucose, Bld: 24 mg/dL — CL (ref 70–99)
Potassium: 5.1 mmol/L (ref 3.5–5.1)
Sodium: 134 mmol/L — ABNORMAL LOW (ref 135–145)

## 2021-03-14 LAB — ALBUMIN, PLEURAL OR PERITONEAL FLUID: Albumin, Fluid: <1 g/dL

## 2021-03-14 LAB — PROTEIN, PLEURAL OR PERITONEAL FLUID: Total protein, fluid: <3 g/dL

## 2021-03-14 LAB — LACTATE DEHYDROGENASE, PLEURAL OR PERITONEAL FLUID: LD, Fluid: 47 U/L — ABNORMAL HIGH (ref 3–23)

## 2021-03-14 MED ORDER — LIDOCAINE HCL (PF) 1 % IJ SOLN
INTRAMUSCULAR | Status: AC
  Filled 2021-03-14: qty 30

## 2021-03-14 MED ORDER — IOHEXOL 300 MG/ML  SOLN
100.0000 mL | Freq: Once | INTRAMUSCULAR | Status: AC | PRN
  Administered 2021-03-14: 100 mL via INTRAVENOUS

## 2021-03-14 MED ORDER — DEXTROSE 50 % IV SOLN
INTRAVENOUS | Status: AC
  Filled 2021-03-14: qty 50

## 2021-03-14 MED ORDER — DEXTROSE 10 % IV SOLN: INTRAVENOUS | Status: DC

## 2021-03-14 MED ORDER — DEXTROSE 50 % IV SOLN
INTRAVENOUS | Status: AC
  Administered 2021-03-14: 50 mL
  Filled 2021-03-14: qty 50

## 2021-03-14 MED ORDER — DEXTROSE 50 % IV SOLN
50.0000 mL | INTRAVENOUS | Status: AC
  Administered 2021-03-14: 50 mL via INTRAVENOUS

## 2021-03-14 MED ORDER — METRONIDAZOLE 500 MG/100ML IV SOLN
500.0000 mg | Freq: Three times a day (TID) | INTRAVENOUS | Status: DC
  Administered 2021-03-14 – 2021-03-15 (×3): 500 mg via INTRAVENOUS
  Filled 2021-03-14 (×3): qty 100

## 2021-03-14 NOTE — Procedures (Signed)
Pre procedural Dx: Symptomatic pleural effusion Post procedural Dx: Same  Successful US guided left sided thoracentesis yielding 420 cc of serous pleural fluid.   Samples sent to lab for analysis.  EBL: None Complications: None immediate.  Ronny Bacon, MD Pager #: [phone]

## 2021-03-14 NOTE — Progress Notes (Addendum)
[facility] Progress Note   Subjective:    Patient seen and examined at bedside. Patient's daughter also at bedside. Patient appears more weak with shallow breaths. Although she tolerated HD yesterday (net UF 1.5L), her condition is currently worsening. Plan to discuss with Hospitalist and Dr. Jonnie Finner.  Objective Vitals:   03/13/21 1535 03/13/21 2151 03/14/21 0535 03/14/21 0800  BP:  (!) 101/36 (!) 108/48 (!) 118/47  Pulse: 84 91 75 72  Resp:  15 13 19   Temp:  97.6 F (36.4 C) 98.1 F (36.7 C) (!) 96.5 F (35.8 C)  TempSrc:  Oral  Axillary  SpO2: 98% 92% 98% 94%  Weight:      Height:       Physical Exam General: Chronically ill-appearing; NAD Lungs: CTA bilaterally. No wheeze, rales or rhonchi. Breathing is shallow. Heart: + 3/6 systolic murmur.  Abdomen: soft, nontender, active bowel sounds Lower extremities: 2+ edema bilateral upper and lower extremities  Neuro: AAOx3. Moves all extremities spontaneously Dialysis Access: R TDC, LU AVF (+) Bruit/Thrill Skin: Bruising noted BLUE   Filed Weights   03/11/21 1727 03/12/21 2014 03/13/21 1200  Weight: 68 kg 71.2 kg 105.2 kg    Intake/Output Summary (Last 24 hours) at 03/14/2021 1138 Last data filed at 03/14/2021 0930 Gross per 24 hour  Intake 120 ml  Output 1500 ml  Net -1380 ml    Additional Objective Labs: Basic Metabolic Panel: Recent Labs  Lab 03/11/21 2045 03/12/21 0413 03/14/21 1038  NA 136 137 134*  K 5.3* 5.1 5.1  CL 101 102 101  CO2 28 28 25   GLUCOSE 83 74 24*  BUN 15 17 19   CREATININE 2.39* 2.57* 2.86*  CALCIUM 7.8* 7.8* 7.7*   Liver Function Tests: Recent Labs  Lab 03/11/21 2045 03/12/21 0413  AST 39 16  ALT 17 12  ALKPHOS 119 104  BILITOT 1.1 1.2  PROT 3.9* 3.4*  ALBUMIN 1.6* 1.4*   No results for input(s): LIPASE, AMYLASE in the last 168 hours. CBC: Recent Labs  Lab 03/11/21 1725 03/12/21 0413 03/13/21 1217 03/14/21 1038  WBC 24.5* 19.1* 29.3* 29.9*  NEUTROABS  24.3* 18.2*  --  29.6*  HGB 10.2* 8.8* 9.9* 9.4*  HCT 33.3* 27.8* 31.3* 30.3*  MCV 98.8 96.5 94.8 96.2  PLT 174 212 251 187   Blood Culture    Component Value Date/Time   SDES BLOOD BLOOD RIGHT ARM 03/11/2021 2220   SPECREQUEST  03/11/2021 2220    BOTTLES DRAWN AEROBIC AND ANAEROBIC Blood Culture results may not be optimal due to an inadequate volume of blood received in culture bottles   CULT  03/11/2021 2220    NO GROWTH 2 DAYS Performed at [facility] [address] [place],  [address]    REPTSTATUS PENDING 03/11/2021 2220    Cardiac Enzymes: No results for input(s): CKTOTAL, CKMB, CKMBINDEX, TROPONINI in the last 168 hours. CBG: No results for input(s): GLUCAP in the last 168 hours. Iron Studies: No results for input(s): IRON, TIBC, TRANSFERRIN, FERRITIN in the last 72 hours. Lab Results  Component Value Date   INR 2.7 (H) 02/28/2021   INR 2.9 (H) 02/27/2021   INR 3.2 (H) 02/26/2021   Studies/Results: DG Chest 2 View  Result Date: 03/14/2021 CLINICAL DATA:  Sepsis, history of CHF EXAM: CHEST - 2 VIEW COMPARISON:  02/25/2021 FINDINGS: No significant change in chest radiographs with small to moderate layering bilateral pleural effusions. No new airspace opacity. Cardiomegaly. Large-bore right neck multi lumen  vascular catheter. IMPRESSION: No significant change in chest radiographs with small to moderate layering bilateral pleural effusions. No new airspace opacity. Cardiomegaly. Electronically Signed   By: Eddie Candle M.D.   On: 03/14/2021 09:43    Medications:  cefTRIAXone (ROCEPHIN)  IV 1 g (03/14/21 0342)   metronidazole      amiodarone  200 mg Oral Daily   apixaban  5 mg Oral BID   fidaxomicin  200 mg Oral BID   gabapentin  300 mg Oral QHS   saccharomyces boulardii  250 mg Oral Daily    Dialysis Orders: [facility] TTS 4h  400/500  68.5kg   2/2 bath P2  AVF / TDC No Heparin Mircera 150 q2, last 03/11/21   Assessment/Plan: 1. Recurrent C-diff  diarrhea- Worsening diarrhea over past 2-3 days. +Leukocytosis. ID and GI consulted; awaiting blood cxs. Continue Dificid. 2. Urinary Tract Infection-on Rocephin 3. ESRD: On HD TTS. Tolerated yesterday's HD with net UF 1.5L. Plan for HD 9/27. 4.Hypertension/volume: Noted 2+ edema BL upper and lower extremities. Not on antihypertensives at home. Albumin very low.  Continue to UF and lower dry as tolerated.  Standing weights if possible.  5 Anemia of ESRD: Hgb 9.4-ESA last given on 9/22 in outpatient, not due yet. 6 Metabolic bone disease: Corrected Ca ok, will check PO4 in AM. Not on binders or VDRA. 7 Nutrition- renal diet with fluid restrictions, will order protein supplements for low albumin. 8 Hx of ESBL E.coli UTI - and other pathogens.  9 Recent L arm compartment syndrome - sp AVF revision / repair.  Currently refusing to use due to ongoing swelling around AVF. 10 Chronic combined CHF 11 Paroxysmal A-Fib - takes Eliquis/amiodarone. 12 Severe aortic stenosis  13. GOC-Seen and examined patient at bedside. Currently, patient is rapidly declining. Patient is very weak and noted shallow breaths but not in acute respiratory distress. Current blood sugar now in the 20s. Bedside RN administering dextrose. Her condition has worsened since yesterday. Patient's daughter [name]) also at bedside. She is very tearful. She is aware of her mother's current medical condition and is now in agreement to speak with Palliative care services. I discussed case with Dr. Sloan Leiter and Dr. Jonnie Finner. Palliative care consulted today. I have placed dialysis orders for 9/27. I did explain to [place] on the risk on continuing HD given her rapid decline. [name] verbalized understanding that HD may no longer continue. Awaiting palliative care for ongoing discussion and support.   Tobie Poet, NP [facility] 03/14/2021,11:38 AM  LOS: 2 days    Pt seen, examined and agree w assess/plan as above with  additions as indicated.  [facility] 03/14/2021, 6:30 PM

## 2021-03-14 NOTE — Progress Notes (Signed)
[place] [facility]  Date of Admission:  03/11/2021      Lines: Right chest hd catheter  Abx: 9/14-c fidaxomycin  ASSESSMENT: Ams Leukocytosis Hx cdiff, recurrent  No obvious sign of cdiff Wbc up and down rather quickly No fever  Some cough today  If patient doesn't make urine, shouldn't have uti. Discussed with nursing to do bladder scan  Await bcx  Given stable dynamics, will await diagnostics before committing to empiric abx for infectious syndrome. ?cdiff, ?pna, ?bsi  --------------- 9/25 assessment She remains lethargic and with elevated wbc but stable hemodynamics and tolerating dialysis otherwise  She continues to have soft stool 2 x the last 24 hours and no abdominal tenderness or n/v  Cxr obtained for cough showed chronic left pleural effusion  Bcx negative so far No urine production; no ucx obtained  She was started on ceftriaxone by primary team    Again unclear if this is cdiff (less likely or some other focus of infection that is not apparent as of today). I have spoken with IR to see if we could do diagnostic thoracentesis to help with dx. Will also get chest ct requeseted by IR, and abd/pelv ct as well  Her right UE purpura/swelling appears stable and clinically doesn't appear infected  PLAN: Continue dificid; per dr Lucianne Lei dam's prior evaluation, she didn't respond well to PO vanc for her cdiff, so will avoid switching over to that Add iv metronidazole Adjust cdiff meds as needed, pending abd/pelv ct Check mrsa nares screen. Can defer systemic vancomycin for now Await ir evaluation of left pleural effusion, pending chest ct Continue ceftriaxone Discussed with primary team   I spent more than 35 minute reviewing data/chart, and coordinating care and >50% direct face to face time providing counseling/discussing diagnostics/treatment plan with patient     Principal Problem:   Recurrent Clostridioides difficile  diarrhea Active Problems:   Encephalopathy acute   Essential hypertension, benign   Sepsis ([place])   Chronic diastolic CHF (congestive heart failure) ([facility])   AF (paroxysmal atrial fibrillation) ([place])   ESRD on hemodialysis ([place]) - on Tu, Th, Sat   Anemia in chronic kidney disease   Aortic stenosis - severe. last echo 09-2020 showed Aortic valve mean gradient measures 56.0 mmHg   Pressure injury of skin   Allergies  Allergen Reactions   Astemizole Nausea And Vomiting   Fluorouracil Rash   Vicodin [Hydrocodone-Acetaminophen] Nausea And Vomiting   Chlorhexidine Rash    Sunburn    rash   Percocet [Oxycodone-Acetaminophen] Nausea And Vomiting    Scheduled Meds:  amiodarone  200 mg Oral Daily   apixaban  5 mg Oral BID   fidaxomicin  200 mg Oral BID   gabapentin  300 mg Oral QHS   saccharomyces boulardii  250 mg Oral Daily   Continuous Infusions:  cefTRIAXone (ROCEPHIN)  IV 1 g (03/14/21 0342)   metronidazole 500 mg (03/14/21 1203)   PRN Meds:.acetaminophen **OR** acetaminophen, fentaNYL (SUBLIMAZE) injection, hyoscyamine, LORazepam, ondansetron **OR** ondansetron (ZOFRAN) IV, temazepam   SUBJECTIVE: Patient appears to me more lethargic Discussed with nursing staff about getting UA, but patient doesn't make urine. Nursing staff confirms with daughter (who told me she makes some urine yesterday) today 9/24 who reports patient doesn't make urine Bcx in progress Afebrile  Patient denies n/v/abd pain Only 2 soft/formed stools last 24 hours  She is coughing while we were talking   Review of Systems: ROS All other  ROS was negative, except mentioned above     OBJECTIVE: Vitals:   03/13/21 1535 03/13/21 2151 03/14/21 0535 03/14/21 0800  BP:  (!) 101/36 (!) 108/48 (!) 118/47  Pulse: 84 91 75 72  Resp:  15 13 19   Temp:  97.6 F (36.4 C) 98.1 F (36.7 C) (!) 96.5 F (35.8 C)  TempSrc:  Oral  Axillary  SpO2: 98% 92% 98% 94%  Weight:      Height:       Body mass index  is 41.1 kg/m.  Physical Exam General/constitutional: no distress, pleasant, lethargic but stable the last 2 days HEENT: Normocephalic, PER, Conj Clear, EOMI, Oropharynx clear Neck supple CV: rrr no mrg Lungs: clear to auscultation, normal respiratory effort Abd: Soft, Nontender Ext: anasarca; rue > LUE swelling with purpura, but nontender Skin: No Rash Neuro: nonfocal MSK: no peripheral joint swelling/tenderness/warmth; back spines nontender   Central line presence: hd cath site no purulence/erythema     Lab Results Lab Results  Component Value Date   WBC 29.9 (H) 03/14/2021   HGB 9.4 (L) 03/14/2021   HCT 30.3 (L) 03/14/2021   MCV 96.2 03/14/2021   PLT 187 03/14/2021    Lab Results  Component Value Date   CREATININE 2.86 (H) 03/14/2021   BUN 19 03/14/2021   NA 134 (L) 03/14/2021   K 5.1 03/14/2021   CL 101 03/14/2021   CO2 25 03/14/2021    Lab Results  Component Value Date   ALT 12 03/12/2021   AST 16 03/12/2021   ALKPHOS 104 03/12/2021   BILITOT 1.2 03/12/2021      Microbiology: Recent Results (from the past 240 hour(s))  Blood culture (routine x 2)     Status: None (Preliminary result)   Collection Time: 03/11/21 10:20 PM   Specimen: BLOOD  Result Value Ref Range Status   Specimen Description BLOOD BLOOD RIGHT ARM  Final   Special Requests   Final    BOTTLES DRAWN AEROBIC AND ANAEROBIC Blood Culture results may not be optimal due to an inadequate volume of blood received in culture bottles   Culture   Final    NO GROWTH 2 DAYS Performed at [facility], [place] [address], [place], [place] [address]    Report Status PENDING  Incomplete  Resp Panel by RT-PCR (Flu A&B, Covid) Nasopharyngeal Swab     Status: None   Collection Time: 03/12/21  4:18 AM   Specimen: Nasopharyngeal Swab; Nasopharyngeal(NP) swabs in vial transport medium  Result Value Ref Range Status   SARS Coronavirus 2 by RT PCR NEGATIVE NEGATIVE Final    Comment: (NOTE) SARS-CoV-2  target nucleic acids are NOT DETECTED.  The SARS-CoV-2 RNA is generally detectable in upper respiratory specimens during the acute phase of infection. The lowest concentration of SARS-CoV-2 viral copies this assay can detect is 138 copies/mL. A negative result does not preclude SARS-Cov-2 infection and should not be used as the sole basis for treatment or other patient management decisions. A negative result may occur with  improper specimen collection/handling, submission of specimen other than nasopharyngeal swab, presence of viral mutation(s) within the areas targeted by this assay, and inadequate number of viral copies(<138 copies/mL). A negative result must be combined with clinical observations, patient history, and epidemiological information. The expected result is Negative.  Fact Sheet for Patients:  EntrepreneurPulse.com.au  Fact Sheet for Healthcare Providers:  IncredibleEmployment.be  This test is no t yet approved or cleared by the [place] and  has been authorized for detection and/or diagnosis of SARS-CoV-2 by FDA under an Emergency Use Authorization (EUA). This EUA will remain  in effect (meaning this test can be used) for the duration of the COVID-19 declaration under Section 564(b)(1) of the Act, 21 U.S.C.section 360bbb-3(b)(1), unless the authorization is terminated  or revoked sooner.       Influenza A by PCR NEGATIVE NEGATIVE Final   Influenza B by PCR NEGATIVE NEGATIVE Final    Comment: (NOTE) The Xpert Xpress SARS-CoV-2/FLU/RSV plus assay is intended as an aid in the diagnosis of influenza from Nasopharyngeal swab specimens and should not be used as a sole basis for treatment. Nasal washings and aspirates are unacceptable for Xpert Xpress SARS-CoV-2/FLU/RSV testing.  Fact Sheet for Patients: EntrepreneurPulse.com.au  Fact Sheet for Healthcare  Providers: IncredibleEmployment.be  This test is not yet approved or cleared by the [place] FDA and has been authorized for detection and/or diagnosis of SARS-CoV-2 by FDA under an Emergency Use Authorization (EUA). This EUA will remain in effect (meaning this test can be used) for the duration of the COVID-19 declaration under Section 564(b)(1) of the Act, 21 U.S.C. section 360bbb-3(b)(1), unless the authorization is terminated or revoked.  Performed at [facility], [place] [address] [place], [place] [address]      Serology:   Imaging: If present, new imagings (plain films, ct scans, and mri) have been personally visualized and interpreted; radiology reports have been reviewed. Decision making incorporated into the Impression / Recommendations.  9/24 cxr Stable left pleural effusion Lung parenchyma clear  Jabier Mutton, MD [facility] [place] [phone] pager    03/14/2021, 1:16 PM

## 2021-03-14 NOTE — Progress Notes (Signed)
PROGRESS NOTE    [name]  ZHG:[id number] DOB: 1946/01/04 DOA: 03/11/2021 PCP: Lajean Manes, MD    Brief Narrative:  75 year old female with history of ESRD on hemodialysis TTS schedule, recurrent C. difficile, hypertension, chronic diastolic heart failure, severe aortic stenosis, chronic debility and wheelchair-bound presented to the ER with 2 to 3 days of worsening diarrhea.  Lives at home with her daughter.  This has been at least fourth episode of C. difficile.  In the ER, white count 24.5. Patient also has history of recurrent UTI, however currently does not make any urine.  Remains very debilitated with failure to thrive.   Assessment & Plan:   Principal Problem:   Recurrent Clostridioides difficile diarrhea Active Problems:   Encephalopathy acute   Essential hypertension, benign   Sepsis (HCC)   Chronic diastolic CHF (congestive heart failure) (HCC)   AF (paroxysmal atrial fibrillation) (HCC)   ESRD on hemodialysis ([place]) - on Tu, Th, Sat   Anemia in chronic kidney disease   Aortic stenosis - severe. last echo 09-2020 showed Aortic valve mean gradient measures 56.0 mmHg   Pressure injury of skin  SIRS, recurrent C. difficile diarrhea: WBC elevated.  Currently on Dificid.  Followed by ID.  Added metronidazole IV today. ID following.  She may benefit with further advanced therapy for C. difficile treatment.  Suspected sepsis: Multiple possible source of infection.  Used to get recurrent UTI, currently not making any urine.  Also has bilateral pleural effusions, therapeutic thoracentesis today. Started on Rocephin, will continue.  Blood cultures negative so far.  ESRD on hemodialysis: Receiving dialysis on her schedule.    Essential hypertension Home blood pressures currently stable.  Managed with dialysis.  Paroxysmal A. fib: Rate controlled.  Therapeutic on Eliquis.  Sinus rhythm on amiodarone.  Chronic diastolic congestive heart failure: Euvolemic.  Managed with  dialysis.  Anemia of chronic disease: Is stable.   Pressure Injury 03/12/21 Sacrum Deep Tissue Pressure Injury - Purple or maroon localized area of discolored intact skin or blood-filled blister due to damage of underlying soft tissue from pressure and/or shear. (Active)  03/12/21 2000  Location: Sacrum  Location Orientation:   Staging: Deep Tissue Pressure Injury - Purple or maroon localized area of discolored intact skin or blood-filled blister due to damage of underlying soft tissue from pressure and/or shear.  Wound Description (Comments):   Present on Admission: Yes     Pressure Injury 03/12/21 Buttocks Left Stage 2 -  Partial thickness loss of dermis presenting as a shallow open injury with a red, pink wound bed without slough. (Active)  03/12/21 2000  Location: Buttocks  Location Orientation: Left  Staging: Stage 2 -  Partial thickness loss of dermis presenting as a shallow open injury with a red, pink wound bed without slough.  Wound Description (Comments):   Present on Admission: No     Pressure Injury 03/12/21 Buttocks Right Stage 2 -  Partial thickness loss of dermis presenting as a shallow open injury with a red, pink wound bed without slough. (Active)  03/12/21 2000  Location: Buttocks  Location Orientation: Right  Staging: Stage 2 -  Partial thickness loss of dermis presenting as a shallow open injury with a red, pink wound bed without slough.  Wound Description (Comments):   Present on Admission: Yes    Goal of care discussion: Daughter at the bedside.  Discussed different issues including failure to thrive, progressive debility and recurrent hospitalizations.  Her daughter agrees that mom is very sick  and not sure how much she will recover.  She is agreeable to meet with palliative care team to get more information and also support her.  We had detailed discussion about her possible infections, lethargy may be associated due to the hemodialysis itself and intolerance  to it.    DVT prophylaxis: SCDs Start: 03/12/21 0122 apixaban (ELIQUIS) tablet 5 mg   Code Status: Full code Family Communication: Daughter at the bedside. Disposition Plan: Status is: Inpatient  Remains inpatient appropriate because:Inpatient level of care appropriate due to severity of illness  Dispo: The patient is from: Home              Anticipated d/c is to: Home              Patient currently is not medically stable to d/c.   Difficult to place patient No         Consultants:  Nephrology Infectious disease Palliative medicine  Procedures:  Routine hemodialysis  Antimicrobials:  Dificid--- ongoing from outpatient Rocephin 9/24--- Flagyl 9/25---   Subjective: Patient seen and examined.  Overnight just lethargic and sleepy.  She is just complaining of being thirsty otherwise no other complaints.  She denies any chest pain, palpitations or abdominal pain.  Denies any nausea.  Objective: Vitals:   03/13/21 1535 03/13/21 2151 03/14/21 0535 03/14/21 0800  BP:  (!) 101/36 (!) 108/48 (!) 118/47  Pulse: 84 91 75 72  Resp:  15 13 19   Temp:  97.6 F (36.4 C) 98.1 F (36.7 C) (!) 96.5 F (35.8 C)  TempSrc:  Oral  Axillary  SpO2: 98% 92% 98% 94%  Weight:      Height:        Intake/Output Summary (Last 24 hours) at 03/14/2021 1133 Last data filed at 03/14/2021 0930 Gross per 24 hour  Intake 120 ml  Output 1500 ml  Net -1380 ml   Filed Weights   03/11/21 1727 03/12/21 2014 03/13/21 1200  Weight: 68 kg 71.2 kg 105.2 kg    Examination:  General exam: Frail.  Debilitated.  Chronically sick looking.  Not in any distress. Respiratory system: No added sounds. Cardiovascular system: S1 & S2 heard, RRR.  Gastrointestinal system: Soft.  Nontender.  Bowel sounds present. Central nervous system: Alert and oriented but very lethargic Sleepy and lethargic.  Chronically sick looking.  Very frail and debilitated. Left upper extremity AV fistula with thrill  present. Multiple ecchymotic area in the skin.    Data Reviewed: I have personally reviewed following labs and imaging studies  CBC: Recent Labs  Lab 03/11/21 1725 03/12/21 0413 03/13/21 1217 03/14/21 1038  WBC 24.5* 19.1* 29.3* 29.9*  NEUTROABS 24.3* 18.2*  --  PENDING  HGB 10.2* 8.8* 9.9* 9.4*  HCT 33.3* 27.8* 31.3* 30.3*  MCV 98.8 96.5 94.8 96.2  PLT 174 212 251 585   Basic Metabolic Panel: Recent Labs  Lab 03/11/21 2045 03/12/21 0413 03/14/21 1038  NA 136 137 134*  K 5.3* 5.1 5.1  CL 101 102 101  CO2 28 28 25   GLUCOSE 83 74 24*  BUN 15 17 19   CREATININE 2.39* 2.57* 2.86*  CALCIUM 7.8* 7.8* 7.7*   GFR: Estimated Creatinine Clearance: 19.4 mL/min (A) (by C-G formula based on SCr of 2.86 mg/dL (H)). Liver Function Tests: Recent Labs  Lab 03/11/21 2045 03/12/21 0413  AST 39 16  ALT 17 12  ALKPHOS 119 104  BILITOT 1.1 1.2  PROT 3.9* 3.4*  ALBUMIN 1.6* 1.4*   No results  for input(s): LIPASE, AMYLASE in the last 168 hours. No results for input(s): AMMONIA in the last 168 hours. Coagulation Profile: No results for input(s): INR, PROTIME in the last 168 hours. Cardiac Enzymes: No results for input(s): CKTOTAL, CKMB, CKMBINDEX, TROPONINI in the last 168 hours. BNP (last 3 results) No results for input(s): PROBNP in the last 8760 hours. HbA1C: No results for input(s): HGBA1C in the last 72 hours. CBG: No results for input(s): GLUCAP in the last 168 hours. Lipid Profile: No results for input(s): CHOL, HDL, LDLCALC, TRIG, CHOLHDL, LDLDIRECT in the last 72 hours. Thyroid Function Tests: Recent Labs    03/11/21 1725  TSH 2.690   Anemia Panel: No results for input(s): VITAMINB12, FOLATE, FERRITIN, TIBC, IRON, RETICCTPCT in the last 72 hours. Sepsis Labs: Recent Labs  Lab 03/11/21 2048  LATICACIDVEN 1.9    Recent Results (from the past 240 hour(s))  Blood culture (routine x 2)     Status: None (Preliminary result)   Collection Time: 03/11/21 10:20  PM   Specimen: BLOOD  Result Value Ref Range Status   Specimen Description BLOOD BLOOD RIGHT ARM  Final   Special Requests   Final    BOTTLES DRAWN AEROBIC AND ANAEROBIC Blood Culture results may not be optimal due to an inadequate volume of blood received in culture bottles   Culture   Final    NO GROWTH 2 DAYS Performed at [facility], [place] [address] [place], [place] [address]    Report Status PENDING  Incomplete  Resp Panel by RT-PCR (Flu A&B, Covid) Nasopharyngeal Swab     Status: None   Collection Time: 03/12/21  4:18 AM   Specimen: Nasopharyngeal Swab; Nasopharyngeal(NP) swabs in vial transport medium  Result Value Ref Range Status   SARS Coronavirus 2 by RT PCR NEGATIVE NEGATIVE Final    Comment: (NOTE) SARS-CoV-2 target nucleic acids are NOT DETECTED.  The SARS-CoV-2 RNA is generally detectable in upper respiratory specimens during the acute phase of infection. The lowest concentration of SARS-CoV-2 viral copies this assay can detect is 138 copies/mL. A negative result does not preclude SARS-Cov-2 infection and should not be used as the sole basis for treatment or other patient management decisions. A negative result may occur with  improper specimen collection/handling, submission of specimen other than nasopharyngeal swab, presence of viral mutation(s) within the areas targeted by this assay, and inadequate number of viral copies(<138 copies/mL). A negative result must be combined with clinical observations, patient history, and epidemiological information. The expected result is Negative.  Fact Sheet for Patients:  EntrepreneurPulse.com.au  Fact Sheet for Healthcare Providers:  IncredibleEmployment.be  This test is no t yet approved or cleared by the [place] FDA and  has been authorized for detection and/or diagnosis of SARS-CoV-2 by FDA under an Emergency Use Authorization (EUA). This EUA will remain  in effect  (meaning this test can be used) for the duration of the COVID-19 declaration under Section 564(b)(1) of the Act, 21 U.S.C.section 360bbb-3(b)(1), unless the authorization is terminated  or revoked sooner.       Influenza A by PCR NEGATIVE NEGATIVE Final   Influenza B by PCR NEGATIVE NEGATIVE Final    Comment: (NOTE) The Xpert Xpress SARS-CoV-2/FLU/RSV plus assay is intended as an aid in the diagnosis of influenza from Nasopharyngeal swab specimens and should not be used as a sole basis for treatment. Nasal washings and aspirates are unacceptable for Xpert Xpress SARS-CoV-2/FLU/RSV testing.  Fact Sheet for Patients: EntrepreneurPulse.com.au  Fact  Sheet for Healthcare Providers: IncredibleEmployment.be  This test is not yet approved or cleared by the [place] and has been authorized for detection and/or diagnosis of SARS-CoV-2 by FDA under an Emergency Use Authorization (EUA). This EUA will remain in effect (meaning this test can be used) for the duration of the COVID-19 declaration under Section 564(b)(1) of the Act, 21 U.S.C. section 360bbb-3(b)(1), unless the authorization is terminated or revoked.  Performed at [facility], [place] [address], [place], [place] [address]          Radiology Studies: DG Chest 2 View  Result Date: 03/14/2021 CLINICAL DATA:  Sepsis, history of CHF EXAM: CHEST - 2 VIEW COMPARISON:  02/25/2021 FINDINGS: No significant change in chest radiographs with small to moderate layering bilateral pleural effusions. No new airspace opacity. Cardiomegaly. Large-bore right neck multi lumen vascular catheter. IMPRESSION: No significant change in chest radiographs with small to moderate layering bilateral pleural effusions. No new airspace opacity. Cardiomegaly. Electronically Signed   By: Eddie Candle M.D.   On: 03/14/2021 09:43        Scheduled Meds:  amiodarone  200 mg Oral Daily   apixaban  5 mg Oral  BID   fidaxomicin  200 mg Oral BID   gabapentin  300 mg Oral QHS   saccharomyces boulardii  250 mg Oral Daily   Continuous Infusions:  cefTRIAXone (ROCEPHIN)  IV 1 g (03/14/21 0342)   metronidazole       LOS: 2 days    Time spent: 25 minutes    Barb Merino, MD [facility] Pager [phone]

## 2021-03-14 NOTE — Consult Note (Signed)
Consultation Note Date: 03/14/2021   Patient Name: [name]  DOB: [birth year]  MRN: [id number]  Age / Sex: 75 y.o., female  PCP: Lajean Manes, MD Referring Physician: Barb Merino, MD  Reason for Consultation: Establishing goals of care  HPI/Patient Profile: 75 y.o. female  with past medical history of end-stage renal disease on hemodialysis on Tuesday Thursday Saturday, recurrent C. difficile diarrhea, hypertension, chronic diastolic heart failure, severe aortic stenosis, anemia of chronic kidney disease  admitted on 03/11/2021 with worsening diarrhea and lethargy.  Patient has been admitted 5 times in the past 6 months, most recently on 02/25/21. She has had progressive weakness and increased white count, CT scans and blood cultures are pending. Palliative medicine has been consulted to assist with goals of care conversation.     Clinical Assessment and Goals of Care:  I have reviewed medical records including EPIC notes, labs and imaging, assessed the patient and then met at the bedside along with daughter [name] to discuss diagnosis prognosis, [place], EOL wishes, disposition and options.  I introduced Palliative Medicine as specialized medical care for people living with serious illness. It focuses on providing relief from the symptoms and stress of a serious illness. The goal is to improve quality of life for both the patient and the family.  We discussed a brief life review of the patient and then focused on their current illness. The natural disease trajectory and expectations at EOL were discussed. 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 She has been widowed for about 20 years and her husband had good support with home hospice before he passed away. [name] is the patient's only child and close relative. They are a family of faith.  Patient has unfortunately had declining health since her fall and fracture in December of 2021. Patient went to SNF for months without adequate rehab. She then moved in with [name] and has had nutritional and functional decline over the past few months. [name] has been assisting with all personal hygiene needs. Patient has been doing well cognitively until the past few days. Discussed current management of unclear infectious source and [name] shares her frustration that a UTI was not considered sooner. This has been a recurrent problem for [place] even with little urine output.    I attempted to elicit values and goals of care important to the patient.   [name] is understandably emotional and realizes that her mother may not recover from this acute illness. While she hopes for improvement, she also wants her mother to be at peace and remain comfortable if she does not improve. She would prefer to take her home with hospice if she continues to decline.  The difference between aggressive medical intervention and comfort care was considered in light of the patient's goals of care.   Advanced directives, concepts specific to code status, artifical feeding and hydration, and rehospitalization were considered and discussed.  Hospice and Palliative Care services outpatient were explained and offered.  Discussed the importance of continued conversation with family and the medical providers regarding overall plan of care and treatment options, ensuring decisions are within the context of the patient's values and GOCs.    Questions and concerns were addressed.  Hard Choices booklet left for review. The family was encouraged to call with questions or concerns.  PMT will continue to support holistically.   HCPOA/daughter [name] is patient's Media planner.    SUMMARY OF RECOMMENDATIONS   -Full code/full scope for now, ongoing discussions pending diagnostic results and patient's status in the coming  days -If patient declines further or does not improve, her daughter will prefer to take her home with hospice -Psychosocial and emotional support provided -Spiritual care consult appreciated -Ongoing support from PMT  Code Status/Advance Care Planning: Full code  Palliative Prophylaxis:  Bowel Regimen, Delirium Protocol, and Turn Reposition  Psycho-social/Spiritual:  Desire for further Chaplaincy support:yes Additional Recommendations: Caregiving  Support/Resources and Education on Hospice  Prognosis:  Guarded  Discharge Planning: To Be Determined      Primary Diagnoses: Present on Admission:  Chronic diastolic CHF (congestive heart failure) (HCC)  AF (paroxysmal atrial fibrillation) (HCC)  Essential hypertension, benign  Anemia in chronic kidney disease   I have reviewed the medical record, interviewed the patient and family, and examined the patient. The following aspects are pertinent.  Past Medical History:  Diagnosis Date   Acute lower GI bleeding 02/26/2021   Arthritis of left knee    Atrial fibrillation with RVR ([place])    CAP (community acquired pneumonia) 07/02/2016   Cardiomyopathy ([place])    a. h/o LV dysfunction EF 20-25% in 2013 due to sepsis.>> improved to normal    Chest pain 11/20/2015   Chronic diastolic CHF (congestive heart failure) ([place])    10/ 2013 in setting of septic shock   Compartment syndrome ([place]) 7/90/2409   Complication of anesthesia    use a little anesthesia , per patient MD states she quit breathing (2016); hard to wake up   Dehydration    ESRD (end stage renal disease) ([place])    dialysis Tues, Thurs, Sat [address], sees dr deterding    Gastroenteritis due to norovirus    GERD (gastroesophageal reflux disease)    Glaucoma    both eyes   H/O hiatal hernia    a. CT 2017: large gastric hiatal hernia.   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 10/ 2013  in setting of Septic Shock //  b. recurrent during admit for pneumonia, L effusion >> placed on Amiodarone // Coumadin for anticoagulation   PNA (pneumonia) 05/10/2016   Post-operative pain    Primary localized osteoarthritis of right hip 10/22/2019   Rectal bleeding    S/P hemodialysis catheter insertion ([place]) 04/11/2015   right anterior chest , only used once    Shortness  of breath 11/06/2015   Sigmoid diverticulosis    UGI bleed 08/23/2018   Social History   Socioeconomic History   Marital status: Widowed    Spouse name: Not on file   Number of children: 1   Years of education: Not on file   Highest education level: Not on file  Occupational History   Occupation: retired   Occupation: Herbalist  Tobacco Use   Smoking status: Never   Smokeless tobacco: Never  Vaping Use   Vaping Use: Never used  Substance and Sexual Activity   Alcohol use: No    Alcohol/week: 0.0 standard drinks   Drug use: No   Sexual activity: Not Currently    Birth control/protection: Post-menopausal, Surgical    Comment: widow husband passed 5/05 with lung cancer  Other Topics Concern   Not on file  Social History Narrative   ** Merged History Encounter **       Social Determinants of Health   Financial Resource Strain: Not on file  Food Insecurity: Not on file  Transportation Needs: Not on file  Physical Activity: Not on file  Stress: Not on file  Social  Connections: Not on file   Family History  Problem Relation Age of Onset   Hypertension Mother    Cancer Mother 68       breast   Dementia Mother    Hypertension Brother    Diabetes Brother    Heart disease Brother        before age 8   Cancer Father 47       pancreatic   Heart failure Paternal Grandmother    Bladder Cancer Maternal Grandfather    Scheduled Meds:  amiodarone  200 mg Oral Daily   apixaban  5 mg Oral BID   fidaxomicin  200 mg Oral BID   gabapentin  300 mg Oral QHS   lidocaine (PF)       saccharomyces boulardii  250 mg Oral Daily   Continuous Infusions:  cefTRIAXone (ROCEPHIN)  IV 1 g (03/14/21 0342)   metronidazole 500 mg (03/14/21 1203)   PRN Meds:.acetaminophen **OR** acetaminophen, fentaNYL (SUBLIMAZE) injection, hyoscyamine, LORazepam, ondansetron **OR** ondansetron (ZOFRAN) IV, temazepam Medications Prior to Admission:  Prior to Admission medications   Medication Sig Start Date End Date Taking? Authorizing Provider  amiodarone (PACERONE) 200 MG tablet Take 1 tablet (200 mg total) by mouth daily. 08/24/20  Yes Jettie Booze, MD  apixaban (ELIQUIS) 5 MG TABS tablet Take 1 tablet (5 mg total) by mouth 2 (two) times daily. 01/09/21  Yes Dwyane Dee, MD  bimatoprost (LUMIGAN) 0.01 % SOLN Place 1 drop into both eyes at bedtime.   Yes [provider]  fidaxomicin (DIFICID) 200 MG TABS tablet Take 1 tablet (200 mg total) by mouth 2 (two) times daily. 03/03/21  Yes Thurnell Lose, MD  gabapentin (NEURONTIN) 600 MG tablet Take 0.5 tablets (300 mg total) by mouth at bedtime. 12/02/20  Yes Love, Ivan Anchors, PA-C  heparin 1000 unit/mL SOLN injection Heparin Sodium (Porcine) 1,000 Units/mL Catheter Lock Arterial 10/24/20 10/23/21 Yes [provider]  LORazepam (ATIVAN) 1 MG tablet Take 1 mg by mouth daily as needed for anxiety.   Yes [provider]  multivitamin (RENA-VIT) TABS tablet Take 1 tablet by mouth at bedtime. 07/27/19  Yes [provider]  ondansetron (ZOFRAN) 4 MG tablet Take 1 tablet (4 mg total) by mouth every 8 (eight) hours as needed for nausea or vomiting. 03/03/21  Yes  Thurnell Lose, MD  Potassium Chloride ER 20 MEQ TBCR Take 20 mEq by mouth daily. 02/13/21  Yes [provider]  saccharomyces boulardii (FLORASTOR) 250 MG capsule Take 1 capsule (250 mg total) by mouth 2 (two) times daily. Patient taking differently: Take 250 mg by mouth daily. 07/08/15  Yes Donne Hazel, MD  timolol (BETIMOL) 0.25 % ophthalmic solution Place 1 drop into both eyes 2 (two) times daily.   Yes [provider]   Allergies  Allergen Reactions   Astemizole Nausea And Vomiting   Fluorouracil Rash   Vicodin [Hydrocodone-Acetaminophen] Nausea And Vomiting   Chlorhexidine Rash    Sunburn    rash   Percocet [Oxycodone-Acetaminophen] Nausea And Vomiting   Review of Systems  Unable to perform ROS: Acuity of condition   Physical Exam Vitals and nursing note reviewed.  Constitutional:      General: She is not in acute distress.    Appearance: She is ill-appearing.     Comments: Appears uncomfortable  Cardiovascular:     Rate and Rhythm: Normal rate.  Pulmonary:     Effort: Pulmonary effort is normal.  Neurological:     Mental Status: She is lethargic.    Vital Signs: BP 104/63 (BP Location: Right Arm)   Pulse 85   Temp 97.7 F (36.5 C) (Oral)   Resp 14   Ht _0  (1.6 m)   Wt 105.2 kg   LMP 01/19/1992 (Approximate)   SpO2 94%   BMI 41.10 kg/m  Pain Scale: Faces   Pain Score: 0-No pain   SpO2: SpO2: 94 % O2 Device:SpO2: 94 % O2 Flow Rate: .   IO: Intake/output summary:  Intake/Output Summary (Last 24 hours) at 03/14/2021 1503 Last data filed at 03/14/2021 0930 Gross per 24 hour  Intake 120 ml  Output --  Net 120 ml    LBM: Last BM Date: 03/13/21 Baseline Weight: Weight: 68 kg Most recent weight: Weight:  (bed wt not accurate)     Palliative Assessment/Data:     Time In:  2:00pm Time Out: 3:10pm Time Total: 70 minutes Greater than 50% of this time was spent in counseling and coordinating care related to the above assessment and plan.  Dorthy Cooler, PA-C Palliative Medicine Team Team phone # [phone]  Thank you for allowing the Palliative Medicine Team to assist in the care of this patient. Please utilize secure chat with additional questions, if there is no response within 30 minutes please call the above phone number.  Palliative Medicine Team providers are available by phone from 7am to 7pm daily and can be reached through the team cell phone.  Should this patient require assistance outside of these hours, please call the patient's attending physician.

## 2021-03-15 DIAGNOSIS — Z515 Encounter for palliative care: Secondary | ICD-10-CM

## 2021-03-15 DIAGNOSIS — A0471 Enterocolitis due to Clostridium difficile, recurrent: Secondary | ICD-10-CM | POA: Diagnosis not present

## 2021-03-15 DIAGNOSIS — A0472 Enterocolitis due to Clostridium difficile, not specified as recurrent: Secondary | ICD-10-CM

## 2021-03-15 DIAGNOSIS — G9340 Encephalopathy, unspecified: Secondary | ICD-10-CM | POA: Diagnosis not present

## 2021-03-15 LAB — CBC WITH DIFFERENTIAL/PLATELET
Abs Immature Granulocytes: 0 10*3/uL (ref 0.00–0.07)
Basophils Absolute: 0 10*3/uL (ref 0.0–0.1)
Basophils Relative: 0 %
Eosinophils Absolute: 0 10*3/uL (ref 0.0–0.5)
Eosinophils Relative: 0 %
HCT: 27.2 % — ABNORMAL LOW (ref 36.0–46.0)
Hemoglobin: 8.7 g/dL — ABNORMAL LOW (ref 12.0–15.0)
Lymphocytes Relative: 3 %
Lymphs Abs: 0.6 10*3/uL — ABNORMAL LOW (ref 0.7–4.0)
MCH: 30.5 pg (ref 26.0–34.0)
MCHC: 32 g/dL (ref 30.0–36.0)
MCV: 95.4 fL (ref 80.0–100.0)
Monocytes Absolute: 0 10*3/uL — ABNORMAL LOW (ref 0.1–1.0)
Monocytes Relative: 0 %
Neutro Abs: 18.2 10*3/uL — ABNORMAL HIGH (ref 1.7–7.7)
Neutrophils Relative %: 97 %
Platelets: 178 10*3/uL (ref 150–400)
RBC: 2.85 MIL/uL — ABNORMAL LOW (ref 3.87–5.11)
RDW: 23.8 % — ABNORMAL HIGH (ref 11.5–15.5)
WBC: 18.8 10*3/uL — ABNORMAL HIGH (ref 4.0–10.5)
nRBC: 0 /100 WBC
nRBC: 0.1 % (ref 0.0–0.2)

## 2021-03-15 LAB — GLUCOSE, CAPILLARY
Glucose-Capillary: 113 mg/dL — ABNORMAL HIGH (ref 70–99)
Glucose-Capillary: 144 mg/dL — ABNORMAL HIGH (ref 70–99)

## 2021-03-15 MED ORDER — PROSOURCE PLUS PO LIQD
30.0000 mL | Freq: Two times a day (BID) | ORAL | Status: DC
  Filled 2021-03-15: qty 30

## 2021-03-15 MED ORDER — FENTANYL CITRATE PF 50 MCG/ML IJ SOSY
25.0000 ug | PREFILLED_SYRINGE | INTRAMUSCULAR | Status: DC
  Administered 2021-03-15 – 2021-03-16 (×7): 25 ug via INTRAVENOUS
  Filled 2021-03-15 (×7): qty 1

## 2021-03-15 MED ORDER — LORAZEPAM 2 MG/ML IJ SOLN
0.5000 mg | Freq: Once | INTRAMUSCULAR | Status: AC
  Administered 2021-03-15: 0.5 mg via INTRAVENOUS
  Filled 2021-03-15: qty 1

## 2021-03-15 MED ORDER — FENTANYL CITRATE PF 50 MCG/ML IJ SOSY: 12.5000 ug | PREFILLED_SYRINGE | INTRAMUSCULAR | Status: DC | PRN

## 2021-03-15 MED ORDER — POLYVINYL ALCOHOL 1.4 % OP SOLN
1.0000 [drp] | Freq: Four times a day (QID) | OPHTHALMIC | Status: DC | PRN
  Filled 2021-03-15: qty 15

## 2021-03-15 MED ORDER — BIOTENE DRY MOUTH MT LIQD: 15.0000 mL | OROMUCOSAL | Status: DC | PRN

## 2021-03-15 MED ORDER — LORAZEPAM 2 MG/ML IJ SOLN
1.0000 mg | Freq: Four times a day (QID) | INTRAMUSCULAR | Status: DC
  Administered 2021-03-15 – 2021-03-16 (×4): 1 mg via INTRAVENOUS
  Filled 2021-03-15 (×5): qty 1

## 2021-03-15 MED ORDER — BISACODYL 10 MG RE SUPP: 10.0000 mg | Freq: Every day | RECTAL | Status: DC | PRN

## 2021-03-15 MED ORDER — GLYCOPYRROLATE 0.2 MG/ML IJ SOLN: 0.4000 mg | INTRAMUSCULAR | Status: DC | PRN

## 2021-03-15 MED ORDER — NEPRO/CARBSTEADY PO LIQD: 237.0000 mL | Freq: Two times a day (BID) | ORAL | Status: DC

## 2021-03-15 MED ORDER — LORAZEPAM 2 MG/ML IJ SOLN
0.5000 mg | Freq: Four times a day (QID) | INTRAMUSCULAR | Status: DC | PRN
  Administered 2021-03-15: 0.5 mg via INTRAVENOUS
  Filled 2021-03-15: qty 1

## 2021-03-15 MED ORDER — LORAZEPAM 2 MG/ML IJ SOLN
1.0000 mg | INTRAMUSCULAR | Status: DC | PRN
  Administered 2021-03-16: 1 mg via INTRAVENOUS

## 2021-03-15 MED ORDER — DIPHENHYDRAMINE HCL 50 MG/ML IJ SOLN: 12.5000 mg | INTRAMUSCULAR | Status: DC | PRN

## 2021-03-15 NOTE — Progress Notes (Signed)
PROGRESS NOTE    [name]  GUR:[id number] DOB: [birth year] DOA: 03/11/2021 PCP: Lajean Manes, MD    Brief Narrative:  75 year old female with history of ESRD on hemodialysis TTS schedule, recurrent C. difficile, hypertension, chronic diastolic heart failure, severe aortic stenosis, chronic debility and wheelchair-bound presented to the ER with 2 to 3 days of worsening diarrhea.  Lives at home with her daughter.  This has been at least fourth episode of C. difficile.  In the ER, white count 24.5. Patient also has history of recurrent UTI, however currently does not make any urine. Remains very debilitated with failure to thrive. 9/25, patient remained persistently hypoglycemic and not awake enough to eat.  On dextrose infusion.   Assessment & Plan:   Principal Problem:   Recurrent Clostridioides difficile diarrhea Active Problems:   Encephalopathy acute   Essential hypertension, benign   Sepsis (HCC)   Chronic diastolic CHF (congestive heart failure) (HCC)   AF (paroxysmal atrial fibrillation) (HCC)   Goals of care, counseling/discussion   ESRD on hemodialysis ([place]) - on Tu, Th, Sat   Anemia in chronic kidney disease   Aortic stenosis - severe. last echo 09-2020 showed Aortic valve mean gradient measures 56.0 mmHg   Pressure injury of skin   Leukocytosis  SIRS, recurrent C. difficile diarrhea: Also suspected UTI. WBC elevated.  Currently on Dificid and Flagyl.  Followed by infectious disease.  Suspected sepsis: Multiple possible source of infection.  Used to get urine sample.  CT scan does demonstrate prominent ureters, however likely because of fluid retention.   Also has bilateral pleural effusions, therapeutic thoracentesis was done on 9/25.  Transudate.  Final cultures pending. Started on Rocephin, will continue.  Blood cultures negative so far.  ESRD on hemodialysis: Receiving dialysis on her schedule.  Scheduled for tomorrow, however I am not sure she can tolerate  it.  Essential hypertension Home blood pressures currently stable.  Managed with dialysis.  Paroxysmal A. fib: Rate controlled. Therapeutic on Eliquis.  Sinus rhythm on amiodarone.  Chronic diastolic congestive heart failure: Euvolemic.  Managed with dialysis.  Anemia of chronic disease: Is stable.  Persistent hypoglycemia: Unrelieved with intermittent dextrose infusion.  Currently in 10% dextrose 50 mill per hour.  Blood sugars better.  She is not eating.  Liberalized to regular diet and increased carb intake.   Pressure Injury 03/12/21 Sacrum Deep Tissue Pressure Injury - Purple or maroon localized area of discolored intact skin or blood-filled blister due to damage of underlying soft tissue from pressure and/or shear. (Active)  03/12/21 2000  Location: Sacrum  Location Orientation:   Staging: Deep Tissue Pressure Injury - Purple or maroon localized area of discolored intact skin or blood-filled blister due to damage of underlying soft tissue from pressure and/or shear.  Wound Description (Comments):   Present on Admission: Yes     Pressure Injury 03/12/21 Buttocks Left Stage 2 -  Partial thickness loss of dermis presenting as a shallow open injury with a red, pink wound bed without slough. (Active)  03/12/21 2000  Location: Buttocks  Location Orientation: Left  Staging: Stage 2 -  Partial thickness loss of dermis presenting as a shallow open injury with a red, pink wound bed without slough.  Wound Description (Comments):   Present on Admission: No     Pressure Injury 03/12/21 Buttocks Right Stage 2 -  Partial thickness loss of dermis presenting as a shallow open injury with a red, pink wound bed without slough. (Active)  03/12/21 2000  Location:  Buttocks  Location Orientation: Right  Staging: Stage 2 -  Partial thickness loss of dermis presenting as a shallow open injury with a red, pink wound bed without slough.  Wound Description (Comments):   Present on Admission: Yes     Goal of care discussion: Daughter at the bedside.  Discussed different issues including failure to thrive, progressive debility and recurrent hospitalizations.  Her daughter agrees that mom is very sick and not sure how much she will recover. Patient was seen by palliative care team 9/25. 9/26, patient is still remains in very poor shape.  She is not eating.  No meaningful interaction.  Blood sugars remain low without dextrose infusion. I discussed in detail with patient's daughter at bedside and I told her that her mom is probably ready to die. She wants to give her another 24 to 48 hours, she wants to see if she can wake up and eat.  Now After discussing details, we agreed for no CPR but continue current level of care.    DVT prophylaxis: SCDs Start: 03/12/21 0122 apixaban (ELIQUIS) tablet 5 mg   Code Status: Full code Family Communication: Daughter [name] at the bedside. Disposition Plan: Status is: Inpatient  Remains inpatient appropriate because:Inpatient level of care appropriate due to severity of illness  Dispo: The patient is from: Home              Anticipated d/c is to: Home , possible home with home hospice.              Patient currently is not medically stable to d/c.   Difficult to place patient No         Consultants:  Nephrology Infectious disease Palliative medicine  Procedures:  Routine hemodialysis  Antimicrobials:  Dificid--- ongoing from outpatient Rocephin 9/24--- Flagyl 9/25---   Subjective:  Patient was seen and examined.  Daughter at the bedside.  Overnight she had 1 formed bowel movement. Patient remains extremely lethargic, she has not eaten any food since yesterday.  Currently on 50 mL/h of 10% dextrose to keep up her blood sugar levels.  After much discussion and talking, patient answered she will try to eat lunch and then will stop.  She will deny any pain, nausea vomiting.  She just have no appetite.  Objective: Vitals:    03/14/21 1744 03/14/21 2117 03/15/21 0512 03/15/21 1100  BP: (!) 139/50 (!) 102/40 (!) 100/45 (!) 99/46  Pulse: 86 89 79 85  Resp: 17 16 16 18   Temp: 98 F (36.7 C) 97.6 F (36.4 C) 97.9 F (36.6 C) 97.8 F (36.6 C)  TempSrc: Oral Oral Oral   SpO2: 94% 95% 96% 100%  Weight:      Height:        Intake/Output Summary (Last 24 hours) at 03/15/2021 1132 Last data filed at 03/15/2021 0305 Gross per 24 hour  Intake 738.28 ml  Output --  Net 738.28 ml   Filed Weights   03/11/21 1727 03/12/21 2014 03/13/21 1200  Weight: 68 kg 71.2 kg 105.2 kg    Examination:  General exam: Frail.  Debilitated.  Sick looking.  Anasarca. Respiratory system: No added sounds. Cardiovascular system: S1 & S2 heard, RRR.  Gastrointestinal system: Soft.  Nontender.  Bowel sounds present. Central nervous system: Alert on strong stimulation.  Mostly lethargic and sleepy.  Looks sick but not in any distress. Very frail and debilitated. Left upper extremity AV fistula with thrill present. Multiple ecchymotic area in the skin.    Data  Reviewed: I have personally reviewed following labs and imaging studies  CBC: Recent Labs  Lab 03/11/21 1725 03/12/21 0413 03/13/21 1217 03/14/21 1038 03/15/21 0409  WBC 24.5* 19.1* 29.3* 29.9* 18.8*  NEUTROABS 24.3* 18.2*  --  29.6* 18.2*  HGB 10.2* 8.8* 9.9* 9.4* 8.7*  HCT 33.3* 27.8* 31.3* 30.3* 27.2*  MCV 98.8 96.5 94.8 96.2 95.4  PLT 174 212 251 187 993   Basic Metabolic Panel: Recent Labs  Lab 03/11/21 2045 03/12/21 0413 03/14/21 1038  NA 136 137 134*  K 5.3* 5.1 5.1  CL 101 102 101  CO2 28 28 25   GLUCOSE 83 74 24*  BUN 15 17 19   CREATININE 2.39* 2.57* 2.86*  CALCIUM 7.8* 7.8* 7.7*   GFR: Estimated Creatinine Clearance: 19.4 mL/min (A) (by C-G formula based on SCr of 2.86 mg/dL (H)). Liver Function Tests: Recent Labs  Lab 03/11/21 2045 03/12/21 0413  AST 39 16  ALT 17 12  ALKPHOS 119 104  BILITOT 1.1 1.2  PROT 3.9* 3.4*  ALBUMIN 1.6*  1.4*   No results for input(s): LIPASE, AMYLASE in the last 168 hours. No results for input(s): AMMONIA in the last 168 hours. Coagulation Profile: No results for input(s): INR, PROTIME in the last 168 hours. Cardiac Enzymes: No results for input(s): CKTOTAL, CKMB, CKMBINDEX, TROPONINI in the last 168 hours. BNP (last 3 results) No results for input(s): PROBNP in the last 8760 hours. HbA1C: No results for input(s): HGBA1C in the last 72 hours. CBG: Recent Labs  Lab 03/14/21 1648 03/14/21 1753 03/14/21 2137 03/15/21 0649 03/15/21 1100  GLUCAP 69* 99 127* 113* 144*   Lipid Profile: No results for input(s): CHOL, HDL, LDLCALC, TRIG, CHOLHDL, LDLDIRECT in the last 72 hours. Thyroid Function Tests: No results for input(s): TSH, T4TOTAL, FREET4, T3FREE, THYROIDAB in the last 72 hours.  Anemia Panel: No results for input(s): VITAMINB12, FOLATE, FERRITIN, TIBC, IRON, RETICCTPCT in the last 72 hours. Sepsis Labs: Recent Labs  Lab 03/11/21 2048  LATICACIDVEN 1.9    Recent Results (from the past 240 hour(s))  Blood culture (routine x 2)     Status: None (Preliminary result)   Collection Time: 03/11/21 10:20 PM   Specimen: BLOOD  Result Value Ref Range Status   Specimen Description BLOOD BLOOD RIGHT ARM  Final   Special Requests   Final    BOTTLES DRAWN AEROBIC AND ANAEROBIC Blood Culture results may not be optimal due to an inadequate volume of blood received in culture bottles   Culture   Final    NO GROWTH 3 DAYS Performed at [facility], [place] [address] [place], [place] [address]    Report Status PENDING  Incomplete  Resp Panel by RT-PCR (Flu A&B, Covid) Nasopharyngeal Swab     Status: None   Collection Time: 03/12/21  4:18 AM   Specimen: Nasopharyngeal Swab; Nasopharyngeal(NP) swabs in vial transport medium  Result Value Ref Range Status   SARS Coronavirus 2 by RT PCR NEGATIVE NEGATIVE Final    Comment: (NOTE) SARS-CoV-2 target nucleic acids are NOT  DETECTED.  The SARS-CoV-2 RNA is generally detectable in upper respiratory specimens during the acute phase of infection. The lowest concentration of SARS-CoV-2 viral copies this assay can detect is 138 copies/mL. A negative result does not preclude SARS-Cov-2 infection and should not be used as the sole basis for treatment or other patient management decisions. A negative result may occur with  improper specimen collection/handling, submission of specimen other than nasopharyngeal swab, presence of  viral mutation(s) within the areas targeted by this assay, and inadequate number of viral copies(<138 copies/mL). A negative result must be combined with clinical observations, patient history, and epidemiological information. The expected result is Negative.  Fact Sheet for Patients:  EntrepreneurPulse.com.au  Fact Sheet for Healthcare Providers:  IncredibleEmployment.be  This test is no t yet approved or cleared by the [place] FDA and  has been authorized for detection and/or diagnosis of SARS-CoV-2 by FDA under an Emergency Use Authorization (EUA). This EUA will remain  in effect (meaning this test can be used) for the duration of the COVID-19 declaration under Section 564(b)(1) of the Act, 21 U.S.C.section 360bbb-3(b)(1), unless the authorization is terminated  or revoked sooner.       Influenza A by PCR NEGATIVE NEGATIVE Final   Influenza B by PCR NEGATIVE NEGATIVE Final    Comment: (NOTE) The Xpert Xpress SARS-CoV-2/FLU/RSV plus assay is intended as an aid in the diagnosis of influenza from Nasopharyngeal swab specimens and should not be used as a sole basis for treatment. Nasal washings and aspirates are unacceptable for Xpert Xpress SARS-CoV-2/FLU/RSV testing.  Fact Sheet for Patients: EntrepreneurPulse.com.au  Fact Sheet for Healthcare Providers: IncredibleEmployment.be  This test is not yet  approved or cleared by the [place] FDA and has been authorized for detection and/or diagnosis of SARS-CoV-2 by FDA under an Emergency Use Authorization (EUA). This EUA will remain in effect (meaning this test can be used) for the duration of the COVID-19 declaration under Section 564(b)(1) of the Act, 21 U.S.C. section 360bbb-3(b)(1), unless the authorization is terminated or revoked.  Performed at [facility], [place] [address], [place], [place] [address]          Radiology Studies: DG Chest 1 View  Result Date: 03/14/2021 CLINICAL DATA:  Sepsis of uncertain etiology. Please perform ultrasound-guided thoracentesis for diagnostic and therapeutic purposes. EXAM: CHEST  1 VIEW COMPARISON:  Chest radiograph-earlier same day; 02/25/2021; CT abdomen pelvis-01/07/2021 FINDINGS: Grossly unchanged enlarged cardiac silhouette and mediastinal contours. Stable position of support apparatus. Interval reduction/resolution of left-sided pleural effusion post thoracentesis. No pneumothorax. Improved aeration the left lung base with minimal left basilar heterogeneous opacities. No definite right-sided pleural effusion. Pulmonary venous congestion without frank evidence of edema. Retrocardiac air and fluid containing structure compatible with known hiatal hernia. Right jugular approach dialysis catheter tips terminate over the superior cavoatrial junction. Post cholecystectomy. No acute osseous abnormalities. IMPRESSION: 1. Interval reduction/resolution of left-sided pleural effusion post thoracentesis with improved aeration of the left lung base. No pneumothorax. 2. Similar findings of cardiomegaly and pulmonary venous congestion without frank evidence of edema. 3. Hiatal hernia. Electronically Signed   By: Sandi Mariscal M.D.   On: 03/14/2021 14:34   DG Chest 2 View  Result Date: 03/14/2021 CLINICAL DATA:  Sepsis, history of CHF EXAM: CHEST - 2 VIEW COMPARISON:  02/25/2021 FINDINGS: No significant  change in chest radiographs with small to moderate layering bilateral pleural effusions. No new airspace opacity. Cardiomegaly. Large-bore right neck multi lumen vascular catheter. IMPRESSION: No significant change in chest radiographs with small to moderate layering bilateral pleural effusions. No new airspace opacity. Cardiomegaly. Electronically Signed   By: Eddie Candle M.D.   On: 03/14/2021 09:43   CT CHEST ABDOMEN PELVIS W CONTRAST  Result Date: 03/14/2021 CLINICAL DATA:  pleural effusion/cough, esrd on iHD, recurrent cdiff on treatment, persistent sepsis EXAM: CT CHEST, ABDOMEN, AND PELVIS WITH CONTRAST TECHNIQUE: Multidetector CT imaging of the chest, abdomen and pelvis was performed following  the standard protocol during bolus administration of intravenous contrast. CONTRAST:  123mL OMNIPAQUE IOHEXOL 300 MG/ML  SOLN COMPARISON:  December 08, 2020, November 20, 2015, May 07, 2018 FINDINGS: CT CHEST FINDINGS Cardiovascular: Cardiomegaly. Aortic valve calcifications. Predominately LEFT-sided coronary artery atherosclerotic calcifications. RIGHT IJ CVC tip terminates in the RIGHT atrium. No pericardial effusion. Enlargement of the main pulmonary artery in relation to the ascending thoracic aorta as can be seen in pulmonary arterial hypertension. Mediastinum/Nodes: No axillary adenopathy. Prominent pretracheal lymph node measures 12 mm in the short axis, unchanged since 2017 (series 3, image 22). Lungs/Pleura: Moderate RIGHT pleural effusion. Evaluation of the parenchyma is limited secondary to respiratory motion. Bibasilar atelectasis. LEFT pleural effusion. Musculoskeletal: Anasarca.  No acute osseous abnormality. CT ABDOMEN PELVIS FINDINGS Hepatobiliary: Hypoenhancement along the falciform ligament, likely focal fatty deposition. Status post cholecystectomy. Unchanged appearance of a mildly dilated common bile duct to approximately 11 mm, likely due to post cholecystectomy state. Portal vein is patent.  Pancreas: Unremarkable. No pancreatic ductal dilatation or surrounding inflammatory changes. Spleen: Splenule.  Unremarkable. Adrenals/Urinary Tract: Adrenal glands are unremarkable. Atrophic native kidneys. Punctate calcifications bilaterally unchanged. Multiple hypodense masses are too small to accurately characterize. Revisualization exuberant urothelial enhancement along the RIGHT greater than LEFT ureter. There is a 6 mm hyperdense mass of the inferior pole of the LEFT kidney, new since 2019 (series 6, image 95). There is an additional possible mass of the inner aspect of the inferior pole of LEFT kidney which measures 5 mm (series 6, image 101; series 7, image 117). Bladder is poorly visualized secondary to streak artifact. Stomach/Bowel: Prominence of the small bowel without evidence of bowel obstruction. Large hiatal hernia. Diverticulosis. There is circumferential bowel wall thickening of the descending colon. Vascular/Lymphatic: Atherosclerotic calcifications of the aorta, mild. No new suspicious lymph nodes. Reproductive: Evaluation of the pelvis is limited secondary to streak artifact from hip arthroplasty. Other: Moderate volume ascites. Musculoskeletal: Advanced degenerative changes of L2-3. Status post LEFT hip arthroplasty. IMPRESSION: 1. There is bowel wall thickening of the descending colon, most consistent with colitis. 2. Anasarca with a moderate RIGHT pleural effusion, small LEFT pleural effusion and moderate volume ascites. 3. Urothelial enhancement of bilateral ureters which could reflect infection in the appropriate clinical setting. 4. There are 2 indeterminate subcentimeter LEFT renal masses which may reflect complicated/hemorrhagic cysts. Recommend follow-up outpatient renal cyst protocol CT when clinically appropriate. 5. Enlargement of the main pulmonary artery in relation to the ascending thoracic aorta as can be seen in the setting of pulmonary arterial hypertension. 6. Bibasilar  atelectasis. Aortic Atherosclerosis (ICD10-I70.0). Electronically Signed   By: Valentino Saxon M.D.   On: 03/14/2021 17:34   US THORACENTESIS ASP PLEURAL SPACE W/IMG GUIDE  Result Date: 03/14/2021 INDICATION: Sepsis of uncertain etiology. Please perform ultrasound-guided thoracentesis for diagnostic and therapeutic purposes. EXAM: US THORACENTESIS ASP PLEURAL SPACE W/IMG GUIDE COMPARISON:  Chest radiograph-earlier same day; 02/25/2021; CT abdomen pelvis-01/07/2021 MEDICATIONS: None. COMPLICATIONS: None immediate. TECHNIQUE: Informed written consent was obtained from the the patient's daughter after a discussion of the risks, benefits and alternatives to treatment. A timeout was performed prior to the initiation of the procedure. With the patient positioned right lateral decubitus, sonographic evaluation was performed of the inferior posterolateral aspect of the left chest demonstrating a moderate sized anechoic left-sided pleural effusion. The lower chest was prepped and draped in the usual sterile fashion. 1% lidocaine was used for local anesthesia. An ultrasound image was saved for documentation purposes. An 8 Fr Safe-T-Centesis catheter was introduced.  The thoracentesis was performed. The catheter was removed and a dressing was applied. The patient tolerated the procedure well without immediate post procedural complication. The patient was escorted to have an upright chest radiograph. FINDINGS: A total of approximately 420 cc of serous fluid was removed. Requested samples were sent to the laboratory. IMPRESSION: Successful ultrasound-guided left sided thoracentesis yielding 420 cc of pleural fluid. Electronically Signed   By: Sandi Mariscal M.D.   On: 03/14/2021 14:37        Scheduled Meds:  (feeding supplement) PROSource Plus  30 mL Oral BID BM   amiodarone  200 mg Oral Daily   apixaban  5 mg Oral BID   feeding supplement (NEPRO CARB STEADY)  237 mL Oral BID BM   fidaxomicin  200 mg Oral BID    gabapentin  300 mg Oral QHS   saccharomyces boulardii  250 mg Oral Daily   Continuous Infusions:  dextrose 50 mL/hr at 03/14/21 1758     LOS: 3 days    Time spent: 34  minutes    Barb Merino, MD [facility] Pager [phone]

## 2021-03-15 NOTE — TOC Initial Note (Signed)
Transition of Care [facility]) - Initial/Assessment Note    Patient Details  Name: [name] MRN: [id number] Date of Birth: [birth year]  Transition of Care [facility]) CM/SW Contact:    Tom-Johnson, Renea Ee, RN Phone Number: 03/15/2021, 4:00 PM  Clinical Narrative:                 CM spoke with patient's daughter, [name] as patient is too lethargic to speak. [name] states patient lives with her and she is the only child. [name] takes care of patient since her health started deteriorating. Has a walker, rollator, transfer board, bedside commode, raised toilet seat, referral made for [facility]. Chrislyn states she will evaluate patient for eligibility and bed availability. Daughter, [name] notified. CM will continue to follow with TOC needs.   Expected Discharge Plan: [place] ([place]) Barriers to Discharge: Continued Medical Work up   Patient Goals and CMS Choice Patient states their goals for this hospitalization and ongoing recovery are:: Hospice   Choice offered to / list presented to : Patient, Adult Children (Daughter)  Expected Discharge Plan and Services Expected Discharge Plan: [place] ([place]) In-house Referral: Hospice / Palliative Care Discharge Planning Services: CM Consult Post Acute Care Choice: Hospice Living arrangements for the past 2 months: Single Family Home                 DME Arranged: N/A DME Agency: NA         HH Agency: Hospice and [place] Date [place]: 03/15/21 Time [place]: 1555 Representative spoke with at [place]: [place]  Prior Living Arrangements/Services Living arrangements for the past 2 months: [place] Lives with:: Adult Children (Daughter) Patient language and need for interpreter reviewed:: Yes Do you feel safe going back to the place where you live?: Yes      Need for Family Participation in Patient Care: Yes  (Comment) Care giver support system in place?: Yes (comment) Current home services: DME Criminal Activity/Legal Involvement Pertinent to Current Situation/Hospitalization: No - Comment as needed  Activities of Daily Living      Permission Sought/Granted Permission sought to share information with : Case Manager, Customer service manager, Family Supports Permission granted to share information with : Yes, Verbal Permission Granted              Emotional Assessment Appearance:: Appears stated age Attitude/Demeanor/Rapport: Lethargic Affect (typically observed): Other (comment) Orientation: : Oriented to Self Alcohol / Substance Use: Not Applicable Psych Involvement: No (comment)  Admission diagnosis:  C. difficile colitis [A04.72] Recurrent Clostridioides difficile diarrhea [A04.71] Patient Active Problem List   Diagnosis Date Noted   Leukocytosis    Pressure injury of skin 03/13/2021   Recurrent Clostridioides difficile diarrhea 02/26/2021   Chronic diarrhea 01/05/2021   Aortic stenosis - severe. last echo 09-2020 showed Aortic valve mean gradient measures 56.0 mmHg 01/05/2021   Physical deconditioning 12/31/2020   Chronic diastolic heart failure ([place]) 12/11/2020   Anxiety 12/11/2020   Malignant hypertensive chronic kidney disease 12/11/2020   Nonrheumatic aortic (valve) stenosis 12/11/2020   Obesity 12/11/2020   Pulmonary hypertension ([place]) 12/11/2020   Current mild episode of major depressive disorder (HCC)    Chronic UTI    Loose stools    ESRD on hemodialysis ([place]) - on Tu, Th, Sat    Acute on chronic anemia    Generalized weakness 11/03/2020   Stress fracture, hip, unspecified, initial encounter for fracture 06/25/2020   Hip  fracture ([place]) 06/07/2020   Atrial fibrillation ([place]) 06/07/2020   Gout, unspecified 11/14/2019   Primary localized osteoarthritis of left knee 10/22/2019   S/P TKR (total knee replacement), left 10/22/2019   Allergy, unspecified,  initial encounter 04/08/2019   Hiatal hernia    Hyperlipidemia 08/23/2018   GERD (gastroesophageal reflux disease) 08/23/2018   Personal history of Methicillin resistant Staphylococcus aureus infection 05/02/2018   Hypothyroidism, unspecified 03/09/2017   Pleural effusion on left    Pleurisy    S/P thoracentesis    Pleural effusion, left 11/20/2015   Pericardial effusion    Pleural effusion    End of life care 11/13/2015   Anemia in chronic kidney disease 11/06/2015   Iron deficiency anemia, unspecified 11/06/2015   Secondary hyperparathyroidism of renal origin ([place]) 11/06/2015   AF (paroxysmal atrial fibrillation) ([place]) 11/05/2015   Severe protein-calorie malnutrition ([place]) 05/27/2015   Chronic diastolic CHF (congestive heart failure) ([place]) 02/25/2015   Normocytic anemia 11/23/2014   Sepsis ([place]) 11/22/2014   Essential hypertension, benign 08/14/2013   Glaucoma    Encephalopathy acute 04/04/2012   PCP:  Lajean Manes, MD Pharmacy:   [id number] - [place], [place] - [address] [place] [address] Phone: [phone] Fax: [phone]  [facility] [id number], [place] [address] Phone: [phone] Fax: [place] [address] Phone: [phone] Fax: [phone]  [facility] [address] [place] [address] Phone: [phone] Fax: [phone]     Social Determinants of Health (SDOH) Interventions    Readmission Risk Interventions No flowsheet data found.

## 2021-03-15 NOTE — Progress Notes (Signed)
Daily Progress Note   Patient Name: [name]       Date: 03/15/2021 DOB: 08/01/1945  Age: 75 y.o. MRN#: [id number] Attending Physician: Barb Merino, MD Primary Care Physician: Lajean Manes, MD Admit Date: 03/11/2021  Reason for Consultation/Follow-up: Establishing goals of care  Subjective: Medical records reviewed. Discussed with RN and assessed patient at the bedside. She has refused medications today, lethargy continues to worsen and she is grimacing. I met with her daughter [name] to follow up on goals of care conversation.  Discussed patient's unfortunately ongoing decline. [name] has found the Hard Choices booklet to be helpful, as well as support from [employer]. She ultimately wants her mother to be at peace and no longer suffer. Discussed signs and symptoms of discomfort and treatment options. Discussed options of home hospice and residential hospice. [name] shares that [facility] once toured [employer] [facility] when her husband was on hospice and she felt it was a great option. She would like to pursue a referral for [facility]. Counseled on referral process.   Questions and concerns addressed. [facility] booklet provided for review. PMT will continue to support holistically.   Length of Stay: 3  Current Medications: Scheduled Meds:  . fentaNYL (SUBLIMAZE) injection  25 mcg Intravenous Q4H  . LORazepam  1 mg Intravenous Q6H    Continuous Infusions:   PRN Meds: acetaminophen **OR** acetaminophen, antiseptic oral rinse, bisacodyl, diphenhydrAMINE, fentaNYL (SUBLIMAZE) injection, glycopyrrolate, LORazepam, [DISCONTINUED] ondansetron **OR** ondansetron (ZOFRAN) IV, polyvinyl alcohol  Physical Exam Vitals and nursing note reviewed.  Constitutional:       Appearance: She is ill-appearing.     Comments: grimacing  Cardiovascular:     Rate and Rhythm: Normal rate.  Pulmonary:     Effort: Pulmonary effort is normal.  Neurological:     Mental Status: She is lethargic.            Vital Signs: BP (!) 99/46   Pulse 85   Temp 97.8 F (36.6 C)   Resp 18   Ht $R'5\' 3"'Sy$  (1.6 m)   Wt 105.2 kg   LMP 01/19/1992 (Approximate)   SpO2 100%   BMI 41.10 kg/m  SpO2: SpO2: 100 % O2 Device: O2 Device: Room Air O2 Flow Rate:    Intake/output summary:  Intake/Output Summary (Last 24 hours) at  03/15/2021 1341 Last data filed at 03/15/2021 0305 Gross per 24 hour  Intake 738.28 ml  Output --  Net 738.28 ml   LBM: Last BM Date: 03/13/21 Baseline Weight: Weight: 68 kg Most recent weight: Weight:  (bed wt not accurate)       Palliative Assessment/Data: 10-20%      Patient Active Problem List   Diagnosis Date Noted  . Leukocytosis   . Pressure injury of skin 03/13/2021  . Recurrent Clostridioides difficile diarrhea 02/26/2021  . Chronic diarrhea 01/05/2021  . Aortic stenosis - severe. last echo 09-2020 showed Aortic valve mean gradient measures 56.0 mmHg 01/05/2021  . Physical deconditioning 12/31/2020  . Chronic diastolic heart failure ([place]) 12/11/2020  . Anxiety 12/11/2020  . Malignant hypertensive chronic kidney disease 12/11/2020  . Nonrheumatic aortic (valve) stenosis 12/11/2020  . Obesity 12/11/2020  . Pulmonary hypertension ([place]) 12/11/2020  . Current mild episode of major depressive disorder ([place])   . Chronic UTI   . Loose stools   . ESRD on hemodialysis ([place]) - on Tu, Th, Sat   . Acute on chronic anemia   . Generalized weakness 11/03/2020  . Stress fracture, hip, unspecified, initial encounter for fracture 06/25/2020  . Hip fracture ([place]) 06/07/2020  . Atrial fibrillation ([place]) 06/07/2020  . Gout, unspecified 11/14/2019  . Primary localized osteoarthritis of left knee 10/22/2019  . S/P TKR (total knee replacement), left  10/22/2019  . Allergy, unspecified, initial encounter 04/08/2019  . Hiatal hernia   . Hyperlipidemia 08/23/2018  . GERD (gastroesophageal reflux disease) 08/23/2018  . Personal history of Methicillin resistant Staphylococcus aureus infection 05/02/2018  . Hypothyroidism, unspecified 03/09/2017  . Pleural effusion on left   . Pleurisy   . S/P thoracentesis   . Pleural effusion, left 11/20/2015  . Pericardial effusion   . Pleural effusion   . End of life care 11/13/2015  . Anemia in chronic kidney disease 11/06/2015  . Iron deficiency anemia, unspecified 11/06/2015  . Secondary hyperparathyroidism of renal origin ([place]) 11/06/2015  . AF (paroxysmal atrial fibrillation) ([place]) 11/05/2015  . Severe protein-calorie malnutrition ([place]) 05/27/2015  . Chronic diastolic CHF (congestive heart failure) ([place]) 02/25/2015  . Normocytic anemia 11/23/2014  . Sepsis ([place]) 11/22/2014  . Essential hypertension, benign 08/14/2013  . Glaucoma   . Encephalopathy acute 04/04/2012    Palliative Care Assessment & Plan   Patient Profile: 75 y.o. female  with past medical history of end-stage renal disease on hemodialysis on Tuesday Thursday Saturday, recurrent C. difficile diarrhea, hypertension, chronic diastolic heart failure, severe aortic stenosis, anemia of chronic kidney disease  admitted on 03/11/2021 with worsening diarrhea and lethargy.   Patient has been admitted 5 times in the past 6 months, most recently on 02/25/21. She has had progressive weakness and increased white count, CT scans and blood cultures are pending. Palliative medicine has been consulted to assist with goals of care conversation.     Assessment: Recurrent c. Diff Acute encephalopathy Sepsis ESRD on HD Goals of care conversation  Recommendations/Plan: DNR confirmed Transition to comfort care today Patient's daughter is interested in [facility] referral, appreciate TOC assistance Fentanyl Q4H for pain, available Q1H PRN for  pain/air hunger/comfort Ativan Q6H for anxiety/agitation, available Q2H PRN for agitation/anxiety Benadryl PRN for itching Zofran PRN for nausea Robinul PRN for excessive secretions Liquifilm tears PRN for dry eyes Bisacodyl PRN for constipation May have comfort feeding Comfort cart for family Unrestricted visitations in the setting of EOL (per policy) Oxygen PRN 2L or less for comfort.  No escalation.   Ongoing support from PMT  Goals of Care and Additional Recommendations: Limitations on Scope of Treatment: Full Comfort Care   Prognosis:  < 2 weeks  Discharge Planning: Hospice facility   Total time: 35 minutes Greater than 50% of this time was spent in counseling and coordinating care related to the above assessment and plan.  Dorthy Cooler, PA-C Palliative Medicine Team Team phone # [phone]  Thank you for allowing the Palliative Medicine Team to assist in the care of this patient. Please utilize secure chat with additional questions, if there is no response within 30 minutes please call the above phone number.  Palliative Medicine Team providers are available by phone from 7am to 7pm daily and can be reached through the team cell phone.  Should this patient require assistance outside of these hours, please call the patient's attending physician.

## 2021-03-15 NOTE — Progress Notes (Signed)
[facility] ([facility]) Hospital Liaison note.    Received request from TOC manager for family interest in [facility]. Chart and pt information under review by [facility] physician.  Hospice eligibility pending at this time.  [facility] is unable to offer a room today. Hospital Liaison will follow up tomorrow or sooner if a room becomes available. Please do not hesitate to call with questions.    Thank you for the opportunity to participate in this patient's care.  Chrislyn King, BSN, RN [facility] Hospital Liaison (listed on AMION under Hospice/[facility])    [phone] (24h on call)     

## 2021-03-15 NOTE — Progress Notes (Signed)
[facility] for Infectious Disease   Reason for visit: Follow up on leukocytosis   Interval History:patient seen this am on rounds; WBC down to 18.8, no fever.  Minimal response; daughter at the bedside.     Physical Exam: Constitutional:  Vitals:   03/15/21 0512 03/15/21 1100  BP: (!) 100/45 (!) 99/46  Pulse: 79 85  Resp: 16 18  Temp: 97.9 F (36.6 C) 97.8 F (36.6 C)  SpO2: 96% 100%   patient appears in NAD Respiratory: Normal respiratory effort; CTA B Cardiovascular: RRR Neuro: no response to me  Review of Systems: Unable to be assessed due to patient factors  Lab Results  Component Value Date   WBC 18.8 (H) 03/15/2021   HGB 8.7 (L) 03/15/2021   HCT 27.2 (L) 03/15/2021   MCV 95.4 03/15/2021   PLT 178 03/15/2021    Lab Results  Component Value Date   CREATININE 2.86 (H) 03/14/2021   BUN 19 03/14/2021   NA 134 (L) 03/14/2021   K 5.1 03/14/2021   CL 101 03/14/2021   CO2 25 03/14/2021    Lab Results  Component Value Date   ALT 12 03/12/2021   AST 16 03/12/2021   ALKPHOS 104 03/12/2021     Microbiology: Recent Results (from the past 240 hour(s))  Blood culture (routine x 2)     Status: None (Preliminary result)   Collection Time: 03/11/21 10:20 PM   Specimen: BLOOD  Result Value Ref Range Status   Specimen Description BLOOD BLOOD RIGHT ARM  Final   Special Requests   Final    BOTTLES DRAWN AEROBIC AND ANAEROBIC Blood Culture results may not be optimal due to an inadequate volume of blood received in culture bottles   Culture   Final    NO GROWTH 4 DAYS Performed at [facility], [place] [address], [place], [place] [address]    Report Status PENDING  Incomplete  Resp Panel by RT-PCR (Flu A&B, Covid) Nasopharyngeal Swab     Status: None   Collection Time: 03/12/21  4:18 AM   Specimen: Nasopharyngeal Swab; Nasopharyngeal(NP) swabs in vial transport medium  Result Value Ref Range Status   SARS Coronavirus 2 by RT PCR NEGATIVE NEGATIVE Final     Comment: (NOTE) SARS-CoV-2 target nucleic acids are NOT DETECTED.  The SARS-CoV-2 RNA is generally detectable in upper respiratory specimens during the acute phase of infection. The lowest concentration of SARS-CoV-2 viral copies this assay can detect is 138 copies/mL. A negative result does not preclude SARS-Cov-2 infection and should not be used as the sole basis for treatment or other patient management decisions. A negative result may occur with  improper specimen collection/handling, submission of specimen other than nasopharyngeal swab, presence of viral mutation(s) within the areas targeted by this assay, and inadequate number of viral copies(<138 copies/mL). A negative result must be combined with clinical observations, patient history, and epidemiological information. The expected result is Negative.  Fact Sheet for Patients:  EntrepreneurPulse.com.au  Fact Sheet for Healthcare Providers:  IncredibleEmployment.be  This test is no t yet approved or cleared by the [place] FDA and  has been authorized for detection and/or diagnosis of SARS-CoV-2 by FDA under an Emergency Use Authorization (EUA). This EUA will remain  in effect (meaning this test can be used) for the duration of the COVID-19 declaration under Section 564(b)(1) of the Act, 21 U.S.C.section 360bbb-3(b)(1), unless the authorization is terminated  or revoked sooner.       Influenza  A by PCR NEGATIVE NEGATIVE Final   Influenza B by PCR NEGATIVE NEGATIVE Final    Comment: (NOTE) The Xpert Xpress SARS-CoV-2/FLU/RSV plus assay is intended as an aid in the diagnosis of influenza from Nasopharyngeal swab specimens and should not be used as a sole basis for treatment. Nasal washings and aspirates are unacceptable for Xpert Xpress SARS-CoV-2/FLU/RSV testing.  Fact Sheet for Patients: EntrepreneurPulse.com.au  Fact Sheet for Healthcare  Providers: IncredibleEmployment.be  This test is not yet approved or cleared by the [place] FDA and has been authorized for detection and/or diagnosis of SARS-CoV-2 by FDA under an Emergency Use Authorization (EUA). This EUA will remain in effect (meaning this test can be used) for the duration of the COVID-19 declaration under Section 564(b)(1) of the Act, 21 U.S.C. section 360bbb-3(b)(1), unless the authorization is terminated or revoked.  Performed at [facility], [place] [address] [place], [place] [address]     Impression/Plan:  1. Leukocytosis - unclear etiology.  Doubt UTI since she does not make urine.  I have stopped the ceftriaxone May be secondary to ongoing C diff infection, though no significant stool output.    2.  C diff infection - stable at this time and has continued on treatment.  Now off of treatment due to #3.  3.  Hospice - patient now under hospice care and comfort measures only.    I will sign off, call with any questions

## 2021-03-15 NOTE — Progress Notes (Signed)
[facility] Progress Note   Subjective:  Seen in room - resting in bed, but without complaints. Daughter at bedside. For HD tomorrow. S/p CT chest/abd yesterday -> B effusions/ascites, s/p L thora yesterday. Palliative care involved, full code for now.  Objective Vitals:   03/14/21 1450 03/14/21 1744 03/14/21 2117 03/15/21 0512  BP: 104/63 (!) 139/50 (!) 102/40 (!) 100/45  Pulse: 85 86 89 79  Resp: 14 17 16 16   Temp: 97.7 F (36.5 C) 98 F (36.7 C) 97.6 F (36.4 C) 97.9 F (36.6 C)  TempSrc: Oral Oral Oral Oral  SpO2: 94% 94% 95% 96%  Weight:      Height:       Physical Exam General: Chronically ill appearing woman, NAD Heart: RRR; 3/6 murmur Lungs: CTA anteriorly Abdomen: soft, non-tender Extremities: 2+ LE edema Dialysis Access: R TDC, LUE AVF  Additional Objective Labs: Basic Metabolic Panel: Recent Labs  Lab 03/11/21 2045 03/12/21 0413 03/14/21 1038  NA 136 137 134*  K 5.3* 5.1 5.1  CL 101 102 101  CO2 28 28 25   GLUCOSE 83 74 24*  BUN 15 17 19   CREATININE 2.39* 2.57* 2.86*  CALCIUM 7.8* 7.8* 7.7*   Liver Function Tests: Recent Labs  Lab 03/11/21 2045 03/12/21 0413  AST 39 16  ALT 17 12  ALKPHOS 119 104  BILITOT 1.1 1.2  PROT 3.9* 3.4*  ALBUMIN 1.6* 1.4*   CBC: Recent Labs  Lab 03/11/21 1725 03/12/21 0413 03/13/21 1217 03/14/21 1038 03/15/21 0409  WBC 24.5* 19.1* 29.3* 29.9* 18.8*  NEUTROABS 24.3* 18.2*  --  29.6* 18.2*  HGB 10.2* 8.8* 9.9* 9.4* 8.7*  HCT 33.3* 27.8* 31.3* 30.3* 27.2*  MCV 98.8 96.5 94.8 96.2 95.4  PLT 174 212 251 187 178   Studies/Results: DG Chest 1 View  Result Date: 03/14/2021 CLINICAL DATA:  Sepsis of uncertain etiology. Please perform ultrasound-guided thoracentesis for diagnostic and therapeutic purposes. EXAM: CHEST  1 VIEW COMPARISON:  Chest radiograph-earlier same day; 02/25/2021; CT abdomen pelvis-01/07/2021 FINDINGS: Grossly unchanged enlarged cardiac silhouette and mediastinal contours.  Stable position of support apparatus. Interval reduction/resolution of left-sided pleural effusion post thoracentesis. No pneumothorax. Improved aeration the left lung base with minimal left basilar heterogeneous opacities. No definite right-sided pleural effusion. Pulmonary venous congestion without frank evidence of edema. Retrocardiac air and fluid containing structure compatible with known hiatal hernia. Right jugular approach dialysis catheter tips terminate over the superior cavoatrial junction. Post cholecystectomy. No acute osseous abnormalities. IMPRESSION: 1. Interval reduction/resolution of left-sided pleural effusion post thoracentesis with improved aeration of the left lung base. No pneumothorax. 2. Similar findings of cardiomegaly and pulmonary venous congestion without frank evidence of edema. 3. Hiatal hernia. Electronically Signed   By: Sandi Mariscal M.D.   On: 03/14/2021 14:34   DG Chest 2 View  Result Date: 03/14/2021 CLINICAL DATA:  Sepsis, history of CHF EXAM: CHEST - 2 VIEW COMPARISON:  02/25/2021 FINDINGS: No significant change in chest radiographs with small to moderate layering bilateral pleural effusions. No new airspace opacity. Cardiomegaly. Large-bore right neck multi lumen vascular catheter. IMPRESSION: No significant change in chest radiographs with small to moderate layering bilateral pleural effusions. No new airspace opacity. Cardiomegaly. Electronically Signed   By: Eddie Candle M.D.   On: 03/14/2021 09:43   CT CHEST ABDOMEN PELVIS W CONTRAST  Result Date: 03/14/2021 CLINICAL DATA:  pleural effusion/cough, esrd on iHD, recurrent cdiff on treatment, persistent sepsis EXAM: CT CHEST, ABDOMEN, AND PELVIS WITH CONTRAST TECHNIQUE: Multidetector CT imaging  of the chest, abdomen and pelvis was performed following the standard protocol during bolus administration of intravenous contrast. CONTRAST:  147mL OMNIPAQUE IOHEXOL 300 MG/ML  SOLN COMPARISON:  December 08, 2020, November 20, 2015,  May 07, 2018 FINDINGS: CT CHEST FINDINGS Cardiovascular: Cardiomegaly. Aortic valve calcifications. Predominately LEFT-sided coronary artery atherosclerotic calcifications. RIGHT IJ CVC tip terminates in the RIGHT atrium. No pericardial effusion. Enlargement of the main pulmonary artery in relation to the ascending thoracic aorta as can be seen in pulmonary arterial hypertension. Mediastinum/Nodes: No axillary adenopathy. Prominent pretracheal lymph node measures 12 mm in the short axis, unchanged since 2017 (series 3, image 22). Lungs/Pleura: Moderate RIGHT pleural effusion. Evaluation of the parenchyma is limited secondary to respiratory motion. Bibasilar atelectasis. LEFT pleural effusion. Musculoskeletal: Anasarca.  No acute osseous abnormality. CT ABDOMEN PELVIS FINDINGS Hepatobiliary: Hypoenhancement along the falciform ligament, likely focal fatty deposition. Status post cholecystectomy. Unchanged appearance of a mildly dilated common bile duct to approximately 11 mm, likely due to post cholecystectomy state. Portal vein is patent. Pancreas: Unremarkable. No pancreatic ductal dilatation or surrounding inflammatory changes. Spleen: Splenule.  Unremarkable. Adrenals/Urinary Tract: Adrenal glands are unremarkable. Atrophic native kidneys. Punctate calcifications bilaterally unchanged. Multiple hypodense masses are too small to accurately characterize. Revisualization exuberant urothelial enhancement along the RIGHT greater than LEFT ureter. There is a 6 mm hyperdense mass of the inferior pole of the LEFT kidney, new since 2019 (series 6, image 95). There is an additional possible mass of the inner aspect of the inferior pole of LEFT kidney which measures 5 mm (series 6, image 101; series 7, image 117). Bladder is poorly visualized secondary to streak artifact. Stomach/Bowel: Prominence of the small bowel without evidence of bowel obstruction. Large hiatal hernia. Diverticulosis. There is circumferential  bowel wall thickening of the descending colon. Vascular/Lymphatic: Atherosclerotic calcifications of the aorta, mild. No new suspicious lymph nodes. Reproductive: Evaluation of the pelvis is limited secondary to streak artifact from hip arthroplasty. Other: Moderate volume ascites. Musculoskeletal: Advanced degenerative changes of L2-3. Status post LEFT hip arthroplasty. IMPRESSION: 1. There is bowel wall thickening of the descending colon, most consistent with colitis. 2. Anasarca with a moderate RIGHT pleural effusion, small LEFT pleural effusion and moderate volume ascites. 3. Urothelial enhancement of bilateral ureters which could reflect infection in the appropriate clinical setting. 4. There are 2 indeterminate subcentimeter LEFT renal masses which may reflect complicated/hemorrhagic cysts. Recommend follow-up outpatient renal cyst protocol CT when clinically appropriate. 5. Enlargement of the main pulmonary artery in relation to the ascending thoracic aorta as can be seen in the setting of pulmonary arterial hypertension. 6. Bibasilar atelectasis. Aortic Atherosclerosis (ICD10-I70.0). Electronically Signed   By: Valentino Saxon M.D.   On: 03/14/2021 17:34   US THORACENTESIS ASP PLEURAL SPACE W/IMG GUIDE  Result Date: 03/14/2021 INDICATION: Sepsis of uncertain etiology. Please perform ultrasound-guided thoracentesis for diagnostic and therapeutic purposes. EXAM: US THORACENTESIS ASP PLEURAL SPACE W/IMG GUIDE COMPARISON:  Chest radiograph-earlier same day; 02/25/2021; CT abdomen pelvis-01/07/2021 MEDICATIONS: None. COMPLICATIONS: None immediate. TECHNIQUE: Informed written consent was obtained from the the patient's daughter after a discussion of the risks, benefits and alternatives to treatment. A timeout was performed prior to the initiation of the procedure. With the patient positioned right lateral decubitus, sonographic evaluation was performed of the inferior posterolateral aspect of the left chest  demonstrating a moderate sized anechoic left-sided pleural effusion. The lower chest was prepped and draped in the usual sterile fashion. 1% lidocaine was used for local anesthesia. An ultrasound image was saved  for documentation purposes. An 8 Fr Safe-T-Centesis catheter was introduced. The thoracentesis was performed. The catheter was removed and a dressing was applied. The patient tolerated the procedure well without immediate post procedural complication. The patient was escorted to have an upright chest radiograph. FINDINGS: A total of approximately 420 cc of serous fluid was removed. Requested samples were sent to the laboratory. IMPRESSION: Successful ultrasound-guided left sided thoracentesis yielding 420 cc of pleural fluid. Electronically Signed   By: Sandi Mariscal M.D.   On: 03/14/2021 14:37    Medications:  cefTRIAXone (ROCEPHIN)  IV 1 g (03/14/21 1805)   dextrose 50 mL/hr at 03/14/21 1758   metronidazole 500 mg (03/15/21 0507)    amiodarone  200 mg Oral Daily   apixaban  5 mg Oral BID   fidaxomicin  200 mg Oral BID   gabapentin  300 mg Oral QHS   saccharomyces boulardii  250 mg Oral Daily    Dialysis Orders: [facility] TTS 4h  400/500  68.5kg   2/2 bath P2 AVF / TDC No Heparin - Mircera 150 q2, last 03/11/21    Assessment/Plan: 1. Recurrent C-diff diarrhea: ID/GI following. On Fidaxomin + IV Flagyl 2. Urinary Tract Infection: On Ceftriaxone. Blood Cx 9/22 negative. 3. ESRD: Continue HD per TTS schedule - next 9/27. 4.Hypertension/volume: BP soft with edema/pleural effusions/ascites. Albumin very low - UF as tolerated. S/p L thora 9/26 - fluid Cx pending. 5. Anemia of ESRD: Hgb 8.7, not due for ESA yet. 6. Metabolic bone disease: Corrected Ca ok, will check PO4 soon. Not on binders or VDRA. 7. Nutrition: Alb incredibly low, needs ongoing protein supplements. 8. Recent L arm compartment syndrome - s/p AVF revision / repair. Currently refusing to use due to ongoing swelling around AVF. 9.  Chronic combined CHF 10. Paroxysmal A-Fib - takes Eliquis/amiodarone. 11. Severe aortic stenosis   [name], PA-C 03/15/2021, 9:13 AM  [facility]

## 2021-03-15 NOTE — Consult Note (Signed)
   [facility] CM Inpatient Consult   03/15/2021  [name] December 21, 1945 [id number]  [place] Organization [ACO] Patient: [name] Medicare  Primary Care Provider:  Lajean Manes, MD [facility] Physician   Patient screened for less than 30 days readmission hospitalization with noted extreme high risk score for unplanned readmission risk and  to assess for potential [place] Management service needs for post hospital transition.  Review of patient's medical record reveals patient is had many attempts by [place] Coordinator to reach without success for previous post hospital follow up for complex care coordination. Reviewed progress notes of MD, palliative consult as well.   Plan:  Continue to follow progress and disposition to assess for post hospital care management needs.    For questions contact:   Natividad Brood, RN BSN [place] [facility] Liaison  [phone] business mobile phone Toll free office [phone]  Fax number: [phone] [email or web address]

## 2021-03-16 DIAGNOSIS — A0471 Enterocolitis due to Clostridium difficile, recurrent: Secondary | ICD-10-CM | POA: Diagnosis not present

## 2021-03-16 DIAGNOSIS — Z7189 Other specified counseling: Secondary | ICD-10-CM | POA: Diagnosis not present

## 2021-03-16 DIAGNOSIS — Z515 Encounter for palliative care: Secondary | ICD-10-CM | POA: Diagnosis not present

## 2021-03-16 DIAGNOSIS — A419 Sepsis, unspecified organism: Secondary | ICD-10-CM | POA: Diagnosis not present

## 2021-03-16 LAB — CULTURE, BLOOD (ROUTINE X 2): Culture: NO GROWTH

## 2021-03-16 LAB — PATHOLOGIST SMEAR REVIEW

## 2021-03-16 LAB — HEPATITIS B SURFACE ANTIBODY, QUANTITATIVE: Hep B S AB Quant (Post): 20.8 m[IU]/mL (ref >9.9)

## 2021-03-16 MED ORDER — DIPHENHYDRAMINE HCL 50 MG/ML IJ SOLN: 12.5000 mg | INTRAMUSCULAR | 0 refills | Status: AC | PRN

## 2021-03-16 MED ORDER — FENTANYL CITRATE PF 50 MCG/ML IJ SOSY: 25.0000 ug | PREFILLED_SYRINGE | INTRAMUSCULAR | 0 refills | Status: AC

## 2021-03-16 MED ORDER — LORAZEPAM 2 MG/ML IJ SOLN: 1.0000 mg | Freq: Four times a day (QID) | INTRAMUSCULAR | 0 refills | Status: AC

## 2021-03-16 MED ORDER — BIOTENE DRY MOUTH MT LIQD: 15.0000 mL | OROMUCOSAL | Status: AC | PRN

## 2021-03-16 MED ORDER — GLYCOPYRROLATE 1 MG PO TABS: 1.0000 mg | ORAL_TABLET | Freq: Two times a day (BID) | ORAL | Status: AC | PRN

## 2021-03-16 MED ORDER — POLYVINYL ALCOHOL 1.4 % OP SOLN: 1.0000 [drp] | Freq: Four times a day (QID) | OPHTHALMIC | 0 refills | Status: AC | PRN

## 2021-03-16 MED ORDER — LORAZEPAM 2 MG/ML IJ SOLN: 1.0000 mg | INTRAMUSCULAR | 0 refills | Status: AC | PRN

## 2021-03-16 MED ORDER — ACETAMINOPHEN 650 MG RE SUPP: 650.0000 mg | Freq: Four times a day (QID) | RECTAL | 0 refills | Status: AC | PRN

## 2021-03-16 MED ORDER — BISACODYL 10 MG RE SUPP: 10.0000 mg | Freq: Every day | RECTAL | 0 refills | Status: AC | PRN

## 2021-03-16 MED ORDER — ACETAMINOPHEN 325 MG PO TABS: 650.0000 mg | ORAL_TABLET | Freq: Four times a day (QID) | ORAL | Status: AC | PRN

## 2021-03-16 MED ORDER — FENTANYL CITRATE PF 50 MCG/ML IJ SOSY: 12.5000 ug | PREFILLED_SYRINGE | INTRAMUSCULAR | 0 refills | Status: AC | PRN

## 2021-03-16 NOTE — Progress Notes (Signed)
Daily Progress Note   Patient Name: [name]       Date: 03/16/2021 DOB: [birth year]  Age: 75 y.o. MRN#: [id number] Attending Physician: Amy Osmond, MD Primary Care Physician: Amy Manes, MD Admit Date: 03/11/2021 Length of Stay: 4 days  Reason for Consultation/Follow-up: Establishing goals of care  HPI/Patient Profile:  75 y.o. female  with past medical history of end-stage renal disease on hemodialysis on Tuesday Thursday Saturday, recurrent C. difficile diarrhea, hypertension, chronic diastolic heart failure, severe aortic stenosis, anemia of chronic kidney disease  admitted on 03/11/2021 with worsening diarrhea and lethargy.   Patient has been admitted 5 times in the past 6 months, most recently on 02/25/21. She has had progressive weakness and increased white count, CT scans and blood cultures are pending. Palliative medicine has been consulted to assist with goals of care conversation.    Subjective:   Subjective: Chart Reviewed. Updates received. Patient Assessed. Created space and opportunity for patient  and family to explore thoughts and feelings regarding current medical situation.  Today's Discussion: I met with the patient and her daughter [name] at the bedside.  The patient is essentially not responsive at this point.  [name] has been at the bedside nearly around-the-clock per staff.  Her daughter shares that it is just her and her mom currently, her dad passed away years ago.  She does have a strong faith and church support family.  Participated in reminiscing with the patient's daughter about her mom.  Provided active listening and emotional support.  The patient's daughter was tearful at times talking about her mom.  Reviewed the plan that we would continue to wait for bed offer from residential hospice.  In the interim we would continue to provide comfort care.  She agreed that the patient looked peaceful and not suffering at the moment.  Answered all  questions, addressed all concerns.  I offered for [name] to reach out to our service if there are any needs.  Review of Systems  Unable to perform ROS: Patient unresponsive   Objective:   Vital Signs:  BP (!) 99/46   Pulse 85   Temp 97.8 F (36.6 C)   Resp 18   Ht _0  (1.6 m)   Wt 105.2 kg   LMP 01/19/1992 (Approximate)   SpO2 100%   BMI 41.10 kg/m   Physical Exam: Physical Exam Vitals and nursing note reviewed.  Constitutional:      General: She is sleeping. She is not in acute distress.    Appearance: She is ill-appearing.  HENT:     Head: Normocephalic and atraumatic.  Pulmonary:     Effort: Pulmonary effort is normal.  Abdominal:     General: Abdomen is flat.     Palpations: Abdomen is soft.  Skin:    General: Skin is warm and dry.    Palliative Assessment/Data: 10%   Assessment & Plan:   Impression: Present on Admission: . Chronic diastolic CHF (congestive heart failure) ([place]) . AF (paroxysmal atrial fibrillation) ([place]) . Essential hypertension, benign . Anemia in chronic kidney disease  75 year old female near the end of life after transition to comfort care and pending transition to residential hospice when a bed is available.  Currently the patient looks comfortable and at peace.  Her daughter is bedside and tearful reminiscing about her mom.  I expect the patient has hours to days to live.  SUMMARY OF RECOMMENDATIONS   Continued comfort care Transition to [facility] when a bed is  available PMT to continue to follow for symptom checks and symptom management  Code Status: DNR  Prognosis: Hours - Days  Discharge Planning: Hospice facility  Discussed with: Patient's daughter [name], nursing staff, chaplain staff, medical staff  Thank you for allowing [name] to participate in the care of [name] PMT will continue to support holistically.  Time Total: 35 min  Visit consisted of counseling and education dealing with the complex and  emotionally intense issues of symptom management and palliative care in the setting of serious and potentially life-threatening illness. Greater than 50%  of this time was spent counseling and coordinating care related to the above assessment and plan.  Amy Field, NP Palliative Medicine Team  Team Phone # [phone] (Nights/Weekends)  02/16/2021, 8:17 AM

## 2021-03-16 NOTE — Progress Notes (Signed)
Manufacturing engineer [facility]) Hospital Liaison note.   Chart reviewed and eligibility confirmed for [facility]. Spoke with family to confirm interest and explain services. Family agreeable to transfer today. TOC aware.    [facility] will notify TOC when registration paperwork has been completed to arrange transport.   RN please call report to [phone].  Please be sure the signed DNR transports with the pt.  Thank you for the opportunity to participate in this patient's care.  Domenic Moras, BSN, RN [facility] Hospital Liaison (listed on [place] under Hospice/[facility])    [phone] (24h on call)

## 2021-03-16 NOTE — Progress Notes (Signed)
This chaplain responded to PMT consult for support for Pt. and daughter-[name].  The chaplain was updated by PMT NP-Eric before the visit.    The chaplain is present with [name] at the Pt. bedside. The daughter's tearfully describes the Pt. is resting comfortably. The chaplain listens reflectively as [name] describes through storytelling the faith community connections in [place] both the Pt. and daughter share. The chaplain understands [name]'s faith is a source of strength as she anticipates the loss of her mother. With gentle encouragement from the chaplain, [name] articulates her plans for self care as she prayers for peace for the Pt.  [name] accepts the chaplain's invitation for prayer and continued spiritual care.

## 2021-03-16 NOTE — Progress Notes (Signed)
Patient ID: [name], female   DOB: 11/08/1945, 75 y.o.   MRN: [id number] Ms. Pechacek was yesterday transitioned to comfort care measures only with the plan to admit to residential hospice [facility]) when a bed becomes available. Nephrology service will sign off and remain available for questions or concerns.   Elmarie Shiley MD [facility]. Office # [phone] Pager # [phone] 9:55 AM

## 2021-03-16 NOTE — Progress Notes (Signed)
Spoke with [facility] for report, ok to go with IV for end of life care and ease. Daughter at bedside. Awaiting EMS pick up.

## 2021-03-16 NOTE — Plan of Care (Signed)
  Problem: Nutrition: Goal: Adequate nutrition will be maintained Outcome: Completed/Met   Problem: Elimination: Goal: Will not experience complications related to bowel motility Outcome: Progressing   Problem: Pain Managment: Goal: General experience of comfort will improve Outcome: Completed/Met   Problem: Skin Integrity: Goal: Risk for impaired skin integrity will decrease Outcome: Progressing

## 2021-03-16 NOTE — Discharge Summary (Signed)
Physician Discharge Summary  [name] XBM:[id number] DOB: 24-May-1946 DOA: 03/11/2021  PCP: Lajean Manes, MD  Admit date: 03/11/2021 Discharge date: 03/16/2021  Admitted From: home Disposition:  [facility]  Discharge Condition:comfort care CODE STATUS:DNR Diet recommendation: comfort feeds  Brief/Interim Summary: Patient admitted with diarrhea and found to have C. Difficile. This is at least 4th episode. She remained unresponsive and not able to maintain hydration or nutrition status despite medical support. GOC was discussed with palliative and family and decided to proceed with comfort care due to failure to thrive and not showing signs of improvement with full treatment.   Discharge Diagnoses:  Principal Problem:   Recurrent Clostridioides difficile diarrhea Active Problems:   Encephalopathy acute   Essential hypertension, benign   Sepsis (HCC)   Chronic diastolic CHF (congestive heart failure) (HCC)   AF (paroxysmal atrial fibrillation) (HCC)   End of life care   ESRD on hemodialysis ([place]) - on Tu, Th, Sat   Anemia in chronic kidney disease   Aortic stenosis - severe. last echo 09-2020 showed Aortic valve mean gradient measures 56.0 mmHg   Pressure injury of skin   Leukocytosis   Allergies as of 03/16/2021       Reactions   Astemizole Nausea And Vomiting   Fluorouracil Rash   Vicodin [hydrocodone-acetaminophen] Nausea And Vomiting   Chlorhexidine Rash   Sunburn    rash   Percocet [oxycodone-acetaminophen] Nausea And Vomiting        Medication List     STOP taking these medications    amiodarone 200 MG tablet Commonly known as: PACERONE   apixaban 5 MG Tabs tablet Commonly known as: ELIQUIS   fidaxomicin 200 MG Tabs tablet Commonly known as: DIFICID   gabapentin 600 MG tablet Commonly known as: NEURONTIN   heparin 1000 unit/mL Soln injection   LORazepam 1 MG tablet Commonly known as: ATIVAN Replaced by: LORazepam 2 MG/ML injection   Lumigan  0.01 % Soln Generic drug: bimatoprost   multivitamin Tabs tablet   Potassium Chloride ER 20 MEQ Tbcr   saccharomyces boulardii 250 MG capsule Commonly known as: FLORASTOR       TAKE these medications    acetaminophen 325 MG tablet Commonly known as: TYLENOL Take 2 tablets (650 mg total) by mouth every 6 (six) hours as needed for mild pain (or Fever >/= 101).   acetaminophen 650 MG suppository Commonly known as: TYLENOL Place 1 suppository (650 mg total) rectally every 6 (six) hours as needed for mild pain (or Fever >/= 101).   antiseptic oral rinse Liqd Apply 15 mLs topically as needed for dry mouth.   bisacodyl 10 MG suppository Commonly known as: DULCOLAX Place 1 suppository (10 mg total) rectally daily as needed for moderate constipation.   diphenhydrAMINE 50 MG/ML injection Commonly known as: BENADRYL Inject 0.25 mLs (12.5 mg total) into the vein every 4 (four) hours as needed for itching.   fentaNYL 50 MCG/ML injection Commonly known as: SUBLIMAZE Inject 0.5 mLs (25 mcg total) into the vein every 4 (four) hours.   fentaNYL 50 MCG/ML injection Commonly known as: SUBLIMAZE Inject 0.25 mLs (12.5 mcg total) into the vein every hour as needed for severe pain (or dyspnea).   glycopyrrolate 1 MG tablet Commonly known as: ROBINUL Take 1 tablet (1 mg total) by mouth 2 (two) times daily as needed.   LORazepam 2 MG/ML injection Commonly known as: ATIVAN Inject 0.5 mLs (1 mg total) into the vein every 6 (six) hours. Replaces: LORazepam 1  MG tablet   LORazepam 2 MG/ML injection Commonly known as: ATIVAN Inject 0.5 mLs (1 mg total) into the vein every 2 (two) hours as needed for anxiety.   ondansetron 4 MG tablet Commonly known as: Zofran Take 1 tablet (4 mg total) by mouth every 8 (eight) hours as needed for nausea or vomiting.   polyvinyl alcohol 1.4 % ophthalmic solution Commonly known as: LIQUIFILM TEARS Place 1 drop into both eyes 4 (four) times daily as  needed for dry eyes.   timolol 0.25 % ophthalmic solution Commonly known as: BETIMOL Place 1 drop into both eyes 2 (two) times daily.        Allergies  Allergen Reactions   Astemizole Nausea And Vomiting   Fluorouracil Rash   Vicodin [Hydrocodone-Acetaminophen] Nausea And Vomiting   Chlorhexidine Rash    Sunburn    rash   Percocet [Oxycodone-Acetaminophen] Nausea And Vomiting    Consultations: Palliative ID Nephrology IR   Procedures/Studies: DG Chest 1 View  Result Date: 03/14/2021 CLINICAL DATA:  Sepsis of uncertain etiology. Please perform ultrasound-guided thoracentesis for diagnostic and therapeutic purposes. EXAM: CHEST  1 VIEW COMPARISON:  Chest radiograph-earlier same day; 02/25/2021; CT abdomen pelvis-01/07/2021 FINDINGS: Grossly unchanged enlarged cardiac silhouette and mediastinal contours. Stable position of support apparatus. Interval reduction/resolution of left-sided pleural effusion post thoracentesis. No pneumothorax. Improved aeration the left lung base with minimal left basilar heterogeneous opacities. No definite right-sided pleural effusion. Pulmonary venous congestion without frank evidence of edema. Retrocardiac air and fluid containing structure compatible with known hiatal hernia. Right jugular approach dialysis catheter tips terminate over the superior cavoatrial junction. Post cholecystectomy. No acute osseous abnormalities. IMPRESSION: 1. Interval reduction/resolution of left-sided pleural effusion post thoracentesis with improved aeration of the left lung base. No pneumothorax. 2. Similar findings of cardiomegaly and pulmonary venous congestion without frank evidence of edema. 3. Hiatal hernia. Electronically Signed   By: Sandi Mariscal M.D.   On: 03/14/2021 14:34   DG Chest 2 View  Result Date: 03/14/2021 CLINICAL DATA:  Sepsis, history of CHF EXAM: CHEST - 2 VIEW COMPARISON:  02/25/2021 FINDINGS: No significant change in chest radiographs with small to  moderate layering bilateral pleural effusions. No new airspace opacity. Cardiomegaly. Large-bore right neck multi lumen vascular catheter. IMPRESSION: No significant change in chest radiographs with small to moderate layering bilateral pleural effusions. No new airspace opacity. Cardiomegaly. Electronically Signed   By: Eddie Candle M.D.   On: 03/14/2021 09:43   CT CHEST ABDOMEN PELVIS W CONTRAST  Result Date: 03/14/2021 CLINICAL DATA:  pleural effusion/cough, esrd on iHD, recurrent cdiff on treatment, persistent sepsis EXAM: CT CHEST, ABDOMEN, AND PELVIS WITH CONTRAST TECHNIQUE: Multidetector CT imaging of the chest, abdomen and pelvis was performed following the standard protocol during bolus administration of intravenous contrast. CONTRAST:  16mL OMNIPAQUE IOHEXOL 300 MG/ML  SOLN COMPARISON:  December 08, 2020, November 20, 2015, May 07, 2018 FINDINGS: CT CHEST FINDINGS Cardiovascular: Cardiomegaly. Aortic valve calcifications. Predominately LEFT-sided coronary artery atherosclerotic calcifications. RIGHT IJ CVC tip terminates in the RIGHT atrium. No pericardial effusion. Enlargement of the main pulmonary artery in relation to the ascending thoracic aorta as can be seen in pulmonary arterial hypertension. Mediastinum/Nodes: No axillary adenopathy. Prominent pretracheal lymph node measures 12 mm in the short axis, unchanged since 2017 (series 3, image 22). Lungs/Pleura: Moderate RIGHT pleural effusion. Evaluation of the parenchyma is limited secondary to respiratory motion. Bibasilar atelectasis. LEFT pleural effusion. Musculoskeletal: Anasarca.  No acute osseous abnormality. CT ABDOMEN PELVIS FINDINGS Hepatobiliary: Hypoenhancement along  the falciform ligament, likely focal fatty deposition. Status post cholecystectomy. Unchanged appearance of a mildly dilated common bile duct to approximately 11 mm, likely due to post cholecystectomy state. Portal vein is patent. Pancreas: Unremarkable. No pancreatic ductal  dilatation or surrounding inflammatory changes. Spleen: Splenule.  Unremarkable. Adrenals/Urinary Tract: Adrenal glands are unremarkable. Atrophic native kidneys. Punctate calcifications bilaterally unchanged. Multiple hypodense masses are too small to accurately characterize. Revisualization exuberant urothelial enhancement along the RIGHT greater than LEFT ureter. There is a 6 mm hyperdense mass of the inferior pole of the LEFT kidney, new since 2019 (series 6, image 95). There is an additional possible mass of the inner aspect of the inferior pole of LEFT kidney which measures 5 mm (series 6, image 101; series 7, image 117). Bladder is poorly visualized secondary to streak artifact. Stomach/Bowel: Prominence of the small bowel without evidence of bowel obstruction. Large hiatal hernia. Diverticulosis. There is circumferential bowel wall thickening of the descending colon. Vascular/Lymphatic: Atherosclerotic calcifications of the aorta, mild. No new suspicious lymph nodes. Reproductive: Evaluation of the pelvis is limited secondary to streak artifact from hip arthroplasty. Other: Moderate volume ascites. Musculoskeletal: Advanced degenerative changes of L2-3. Status post LEFT hip arthroplasty. IMPRESSION: 1. There is bowel wall thickening of the descending colon, most consistent with colitis. 2. Anasarca with a moderate RIGHT pleural effusion, small LEFT pleural effusion and moderate volume ascites. 3. Urothelial enhancement of bilateral ureters which could reflect infection in the appropriate clinical setting. 4. There are 2 indeterminate subcentimeter LEFT renal masses which may reflect complicated/hemorrhagic cysts. Recommend follow-up outpatient renal cyst protocol CT when clinically appropriate. 5. Enlargement of the main pulmonary artery in relation to the ascending thoracic aorta as can be seen in the setting of pulmonary arterial hypertension. 6. Bibasilar atelectasis. Aortic Atherosclerosis (ICD10-I70.0).  Electronically Signed   By: Valentino Saxon M.D.   On: 03/14/2021 17:34   DG Chest Portable 1 View  Result Date: 02/25/2021 CLINICAL DATA:  Fatigue, weakness EXAM: PORTABLE CHEST 1 VIEW COMPARISON:  12/31/2020 FINDINGS: Right dialysis catheter remains in place, unchanged. Cardiomegaly with vascular congestion. Perihilar and lower lobe airspace disease, likely edema. Suspect layering effusions. IMPRESSION: Cardiomegaly with mild CHF.  Suspect layering effusions. Electronically Signed   By: Rolm Baptise M.D.   On: 02/25/2021 19:34   US THORACENTESIS ASP PLEURAL SPACE W/IMG GUIDE  Result Date: 03/14/2021 INDICATION: Sepsis of uncertain etiology. Please perform ultrasound-guided thoracentesis for diagnostic and therapeutic purposes. EXAM: US THORACENTESIS ASP PLEURAL SPACE W/IMG GUIDE COMPARISON:  Chest radiograph-earlier same day; 02/25/2021; CT abdomen pelvis-01/07/2021 MEDICATIONS: None. COMPLICATIONS: None immediate. TECHNIQUE: Informed written consent was obtained from the the patient's daughter after a discussion of the risks, benefits and alternatives to treatment. A timeout was performed prior to the initiation of the procedure. With the patient positioned right lateral decubitus, sonographic evaluation was performed of the inferior posterolateral aspect of the left chest demonstrating a moderate sized anechoic left-sided pleural effusion. The lower chest was prepped and draped in the usual sterile fashion. 1% lidocaine was used for local anesthesia. An ultrasound image was saved for documentation purposes. An 8 Fr Safe-T-Centesis catheter was introduced. The thoracentesis was performed. The catheter was removed and a dressing was applied. The patient tolerated the procedure well without immediate post procedural complication. The patient was escorted to have an upright chest radiograph. FINDINGS: A total of approximately 420 cc of serous fluid was removed. Requested samples were sent to the  laboratory. IMPRESSION: Successful ultrasound-guided left sided thoracentesis yielding 420 cc  of pleural fluid. Electronically Signed   By: Sandi Mariscal M.D.   On: 03/14/2021 14:37    Subjective: Patient appeared to be asleep and comfortable. Daughter at bedside had no questions or concerns today. She is ready for he mother to be transferred to [facility].   Discharge Exam: Blood pressure (!) 73/42, pulse (!) 54, temperature 99.4 F (37.4 C), temperature source Axillary, resp. rate 16, height 5\' 3"  (1.6 m), weight 105.2 kg, last menstrual period 01/19/1992, SpO2 93 %.  General: Pt is asleep and does not in acute distress Respiratory: non-labored Abdominal: Soft, NT, ND Extremities: no edema, no cyanosis  Microbiology: Recent Results (from the past 240 hour(s))  Blood culture (routine x 2)     Status: None (Preliminary result)   Collection Time: 03/11/21 10:20 PM   Specimen: BLOOD  Result Value Ref Range Status   Specimen Description BLOOD BLOOD RIGHT ARM  Final   Special Requests   Final    BOTTLES DRAWN AEROBIC AND ANAEROBIC Blood Culture results may not be optimal due to an inadequate volume of blood received in culture bottles   Culture   Final    NO GROWTH 4 DAYS Performed at [facility], [place] [address], [place], [place] [address]    Report Status PENDING  Incomplete  Resp Panel by RT-PCR (Flu A&B, Covid) Nasopharyngeal Swab     Status: None   Collection Time: 03/12/21  4:18 AM   Specimen: Nasopharyngeal Swab; Nasopharyngeal(NP) swabs in vial transport medium  Result Value Ref Range Status   SARS Coronavirus 2 by RT PCR NEGATIVE NEGATIVE Final    Comment: (NOTE) SARS-CoV-2 target nucleic acids are NOT DETECTED.  The SARS-CoV-2 RNA is generally detectable in upper respiratory specimens during the acute phase of infection. The lowest concentration of SARS-CoV-2 viral copies this assay can detect is 138 copies/mL. A negative result does not preclude  SARS-Cov-2 infection and should not be used as the sole basis for treatment or other patient management decisions. A negative result may occur with  improper specimen collection/handling, submission of specimen other than nasopharyngeal swab, presence of viral mutation(s) within the areas targeted by this assay, and inadequate number of viral copies(<138 copies/mL). A negative result must be combined with clinical observations, patient history, and epidemiological information. The expected result is Negative.  Fact Sheet for Patients:  EntrepreneurPulse.com.au  Fact Sheet for Healthcare Providers:  IncredibleEmployment.be  This test is no t yet approved or cleared by the [place] FDA and  has been authorized for detection and/or diagnosis of SARS-CoV-2 by FDA under an Emergency Use Authorization (EUA). This EUA will remain  in effect (meaning this test can be used) for the duration of the COVID-19 declaration under Section 564(b)(1) of the Act, 21 U.S.C.section 360bbb-3(b)(1), unless the authorization is terminated  or revoked sooner.       Influenza A by PCR NEGATIVE NEGATIVE Final   Influenza B by PCR NEGATIVE NEGATIVE Final    Comment: (NOTE) The Xpert Xpress SARS-CoV-2/FLU/RSV plus assay is intended as an aid in the diagnosis of influenza from Nasopharyngeal swab specimens and should not be used as a sole basis for treatment. Nasal washings and aspirates are unacceptable for Xpert Xpress SARS-CoV-2/FLU/RSV testing.  Fact Sheet for Patients: EntrepreneurPulse.com.au  Fact Sheet for Healthcare Providers: IncredibleEmployment.be  This test is not yet approved or cleared by the [place] FDA and has been authorized for detection and/or diagnosis of SARS-CoV-2 by FDA under an Emergency Use Authorization (EUA).  This EUA will remain in effect (meaning this test can be used) for the duration of  the COVID-19 declaration under Section 564(b)(1) of the Act, 21 U.S.C. section 360bbb-3(b)(1), unless the authorization is terminated or revoked.  Performed at [facility], [place] [address] [place], [place] [address]      Labs: BNP (last 3 results) Recent Labs    03/01/21 0136 03/02/21 0247 03/03/21 0117  BNP 10.6 2,034.8* 3,818.4*   Basic Metabolic Panel: Recent Labs  Lab 03/11/21 2045 03/12/21 0413 03/14/21 1038  NA 136 137 134*  K 5.3* 5.1 5.1  CL 101 102 101  CO2 28 28 25   GLUCOSE 83 74 24*  BUN 15 17 19   CREATININE 2.39* 2.57* 2.86*  CALCIUM 7.8* 7.8* 7.7*   Liver Function Tests: Recent Labs  Lab 03/11/21 2045 03/12/21 0413  AST 39 16  ALT 17 12  ALKPHOS 119 104  BILITOT 1.1 1.2  PROT 3.9* 3.4*  ALBUMIN 1.6* 1.4*   CBC: Recent Labs  Lab 03/11/21 1725 03/12/21 0413 03/13/21 1217 03/14/21 1038 03/15/21 0409  WBC 24.5* 19.1* 29.3* 29.9* 18.8*  NEUTROABS 24.3* 18.2*  --  29.6* 18.2*  HGB 10.2* 8.8* 9.9* 9.4* 8.7*  HCT 33.3* 27.8* 31.3* 30.3* 27.2*  MCV 98.8 96.5 94.8 96.2 95.4  PLT 174 212 251 187 178    Time coordinating discharge: Over 30 minutes  Richarda Osmond, MD  [facility] 03/16/2021, 12:58 PM Pager   If 7PM-7AM, please contact night-coverage [email or web address] Password [email or web address]

## 2021-03-16 NOTE — TOC Transition Note (Signed)
Transition of Care [facility]) - CM/SW Discharge Note   Patient Details  Name: ROSLYN ELSE MRN: [id number] Date of Birth: [birth year]  [facility] CM/SW Contact:  Tom-Johnson, Renea Ee, RN Phone Number: 03/16/2021, 3:37 PM   Clinical Narrative:    CM notified via secure chat by Chrislyn, Liaison with [facility] that a bed is available today for patient to be transferred. [name], patient's daughter went over to [employer] and signed paperwork. Face sheet and medical necessity forms placed in discharge package. Nurse to call and give report. CM called [facility] for transportation and has patient 6th on the list. Daughter notified. No further TOC needs noted.   Final next level of care: [place] Barriers to Discharge: No Barriers Identified   Patient Goals and CMS Choice Patient states their goals for this hospitalization and ongoing recovery are:: [place] with Hospice services. CMS Medicare.gov Compare Post Acute Care list provided to:: Patient Choice offered to / list presented to : Patient, Adult Children (Daughter)  Discharge Placement              Patient chooses bed at:  [facility]) Patient to be transferred to facility by: [place] Name of family member notified: Janilah Hojnacki, daughter Patient and family notified of of transfer: 03/16/21  Discharge Plan and Services In-house Referral: Hospice / Palliative Care Discharge Planning Services: CM Consult Post Acute Care Choice: Hospice          DME Arranged: N/A DME Agency: NA         HH Agency: Hospice and [place] Date [place]: 03/15/21 Time [place]: [place] Representative spoke with at [place]: [place] ([place]) Interventions     Readmission Risk Interventions No flowsheet data found.

## 2021-03-20 DEATH — deceased

## 2021-04-13 ENCOUNTER — Encounter (HOSPITAL_COMMUNITY): Payer: Self-pay | Admitting: Radiology

## 2023-09-20 ENCOUNTER — Other Ambulatory Visit (HOSPITAL_COMMUNITY): Payer: Self-pay
